# Patient Record
Sex: Male | Born: 1950 | Hispanic: No | Marital: Married | Smoking: Former smoker
Health system: Southern US, Community
[De-identification: ages and names within clinical notes are randomized; demographics above are authoritative.]

## PROBLEM LIST (undated history)

## (undated) DIAGNOSIS — M278 Other specified diseases of jaws: Secondary | ICD-10-CM

## (undated) DIAGNOSIS — M159 Polyosteoarthritis, unspecified: Secondary | ICD-10-CM

## (undated) DIAGNOSIS — I82409 Acute embolism and thrombosis of unspecified deep veins of unspecified lower extremity: Secondary | ICD-10-CM

## (undated) DIAGNOSIS — T7840XA Allergy, unspecified, initial encounter: Secondary | ICD-10-CM

## (undated) DIAGNOSIS — H409 Unspecified glaucoma: Secondary | ICD-10-CM

## (undated) DIAGNOSIS — G7241 Inclusion body myositis [IBM]: Secondary | ICD-10-CM

## (undated) DIAGNOSIS — R0600 Dyspnea, unspecified: Secondary | ICD-10-CM

## (undated) DIAGNOSIS — N401 Enlarged prostate with lower urinary tract symptoms: Secondary | ICD-10-CM

## (undated) DIAGNOSIS — M4800 Spinal stenosis, site unspecified: Secondary | ICD-10-CM

## (undated) DIAGNOSIS — J986 Disorders of diaphragm: Secondary | ICD-10-CM

## (undated) DIAGNOSIS — I251 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: Secondary | ICD-10-CM

## (undated) DIAGNOSIS — R531 Weakness: Secondary | ICD-10-CM

## (undated) DIAGNOSIS — R0989 Other specified symptoms and signs involving the circulatory and respiratory systems: Secondary | ICD-10-CM

## (undated) DIAGNOSIS — M1990 Unspecified osteoarthritis, unspecified site: Secondary | ICD-10-CM

## (undated) DIAGNOSIS — H269 Unspecified cataract: Secondary | ICD-10-CM

## (undated) DIAGNOSIS — J301 Allergic rhinitis due to pollen: Secondary | ICD-10-CM

## (undated) DIAGNOSIS — R600 Localized edema: Secondary | ICD-10-CM

## (undated) DIAGNOSIS — J189 Pneumonia, unspecified organism: Secondary | ICD-10-CM

## (undated) DIAGNOSIS — D539 Nutritional anemia, unspecified: Secondary | ICD-10-CM

## (undated) DIAGNOSIS — I4510 Unspecified right bundle-branch block: Secondary | ICD-10-CM

## (undated) DIAGNOSIS — B49 Unspecified mycosis: Secondary | ICD-10-CM

## (undated) DIAGNOSIS — I10 Essential (primary) hypertension: Secondary | ICD-10-CM

## (undated) DIAGNOSIS — D696 Thrombocytopenia, unspecified: Secondary | ICD-10-CM

## (undated) DIAGNOSIS — E785 Hyperlipidemia, unspecified: Secondary | ICD-10-CM

## (undated) DIAGNOSIS — M609 Myositis, unspecified: Secondary | ICD-10-CM

## (undated) HISTORY — DX: Other specified diseases of jaws: M27.8

## (undated) HISTORY — DX: Dyspnea, unspecified: R06.00

## (undated) HISTORY — DX: Allergic rhinitis due to pollen: J30.1

## (undated) HISTORY — DX: Polyosteoarthritis, unspecified: M15.9

## (undated) HISTORY — DX: Nutritional anemia, unspecified: D53.9

## (undated) HISTORY — DX: Allergy, unspecified, initial encounter: T78.40XA

## (undated) HISTORY — DX: Myositis, unspecified: M60.9

## (undated) HISTORY — DX: Acute embolism and thrombosis of unspecified deep veins of unspecified lower extremity: I82.409

## (undated) HISTORY — DX: Spinal stenosis, site unspecified: M48.00

## (undated) HISTORY — DX: Hyperlipidemia, unspecified: E78.5

## (undated) HISTORY — DX: Disorders of diaphragm: J98.6

## (undated) HISTORY — DX: Localized edema: R60.0

## (undated) HISTORY — PX: OTHER SURGICAL HISTORY: SHX169

## (undated) HISTORY — DX: Other specified symptoms and signs involving the circulatory and respiratory systems: R09.89

## (undated) HISTORY — DX: Unspecified mycosis: B49

## (undated) HISTORY — DX: Benign prostatic hyperplasia with lower urinary tract symptoms: N40.1

## (undated) HISTORY — DX: 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: I25.10

## (undated) HISTORY — DX: Unspecified right bundle-branch block: I45.10

## (undated) HISTORY — PX: TRANSTHORACIC ECHOCARDIOGRAM: SHX275

## (undated) HISTORY — DX: Inclusion body myositis (IBM): G72.41

## (undated) HISTORY — DX: Unspecified cataract: H26.9

## (undated) HISTORY — DX: Thrombocytopenia, unspecified: D69.6

## (undated) HISTORY — PX: COLONOSCOPY: SHX174

## (undated) HISTORY — DX: Unspecified glaucoma: H40.9

## (undated) HISTORY — PX: CORONARY ANGIOPLASTY: SHX604

## (undated) HISTORY — DX: Unspecified osteoarthritis, unspecified site: M19.90

## (undated) HISTORY — PX: EYE SURGERY: SHX253

---

## 1996-07-26 HISTORY — PX: CHOLECYSTECTOMY: SHX55

## 2003-07-27 HISTORY — PX: HYDROCELE EXCISION: SHX482

## 2008-07-26 HISTORY — PX: APPENDECTOMY: SHX54

## 2009-09-23 HISTORY — PX: CERVICAL FUSION: SHX112

## 2010-07-26 HISTORY — PX: TONSILLECTOMY AND ADENOIDECTOMY: SHX28

## 2011-07-01 LAB — HM COLONOSCOPY

## 2011-11-24 HISTORY — PX: LUMBAR SPINE SURGERY: SHX701

## 2013-09-06 DIAGNOSIS — Z23 Encounter for immunization: Secondary | ICD-10-CM | POA: Diagnosis not present

## 2013-09-06 DIAGNOSIS — I10 Essential (primary) hypertension: Secondary | ICD-10-CM | POA: Diagnosis not present

## 2013-09-06 DIAGNOSIS — E7800 Pure hypercholesterolemia, unspecified: Secondary | ICD-10-CM | POA: Diagnosis not present

## 2013-10-08 ENCOUNTER — Encounter: Payer: Self-pay | Admitting: Internal Medicine

## 2013-10-08 ENCOUNTER — Ambulatory Visit (INDEPENDENT_AMBULATORY_CARE_PROVIDER_SITE_OTHER): Payer: Medicare Other | Admitting: Internal Medicine

## 2013-10-08 VITALS — BP 159/86 | HR 57 | Ht 70.0 in | Wt 284.0 lb

## 2013-10-08 DIAGNOSIS — R9389 Abnormal findings on diagnostic imaging of other specified body structures: Secondary | ICD-10-CM

## 2013-10-08 DIAGNOSIS — R918 Other nonspecific abnormal finding of lung field: Secondary | ICD-10-CM | POA: Diagnosis not present

## 2013-10-08 DIAGNOSIS — I251 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: Secondary | ICD-10-CM

## 2013-10-08 NOTE — Patient Instructions (Signed)
Your physician has recommended you make the following change in your medication: DISCONTINUE [place] physician recommends that you schedule a follow-up appointment AT [place] June, 2015.  Your physician recommends that you return for lab work in: [place] June (FASTING)  A chest x-ray takes a picture of the organs and structures inside the chest, including the heart, lungs, and blood vessels. This test can show several things, including, whether the heart is enlarges; whether fluid is building up in the lungs; and whether pacemaker / defibrillator leads are still in place.

## 2013-10-08 NOTE — Progress Notes (Addendum)
HPI Patient is a 63 yo who has moved to [place] [name] Michaelis, MD) Patient with a history of CAD /  S/P stent to LAD in 1009    Myoview in 2011, no ischemia  Diaphragm attenuation. Ehoc:  LVEF 55%  November 2013 had stress test  Seen after by Dr Collier Bullock  Carotd USN mild atherosclerotic changes.    Does not think he has apnea  Wife does not report snoring  Zetia  Wants to stop Zetia It is expensive $$  ? If related to weakness.    Weakness.  Chronic  Has seen neurology in [place] Back/neck surgeries   Weak all over  About time started Zetia  Breathing  fair Chest  No chest pains.   BP 120-130/80-90   No Known Allergies  Current Outpatient Prescriptions  Medication Sig Dispense Refill  . aspirin 325 MG EC tablet Take 325 mg by mouth daily.      Marland Kitchen atenolol (TENORMIN) 25 MG tablet 1 tab twice a day      . b complex vitamins tablet Take 1 tablet by mouth daily.      . Coenzyme Q10 (CO Q 10 PO) Take by mouth. 1 tab daily      . folic acid (FOLVITE) 1 MG tablet Take 1 mg by mouth daily. OVER THE COUNTER      . ibuprofen (ADVIL,MOTRIN) 200 MG tablet Take 200 mg by mouth every 6 (six) hours as needed.      Marland Kitchen KRILL OIL PO Take by mouth. 1 tab daily      . lisinopril (PRINIVIL,ZESTRIL) 5 MG tablet Take 1 tab daily      . saw palmetto 160 MG capsule Take 160 mg by mouth daily.      Marland Kitchen ZETIA 10 MG tablet 1 tab daily       No current facility-administered medications for this visit.    Past Medical History  Diagnosis Date  . Coronary artery disease   . Spinal stenosis     Dr Vaughan Sine  surgery  . Myositis     chronic CK elevation ( 415 CK, ALL MM CK subtype, seen by Rheumatology and considered normal Variant)  . Snoring     sleep apnea  . Fever     Valley Fever  . Hyperlipidemia   . Palpable abd. aorta   . Palatal fistula   . Palpitations   . Dyspnea   . RBBB     Past Surgical History  Procedure Laterality Date  . Cervical fusion  09/2009  . Coronary angioplasty    . Lumbar  spine surgery  5/13    Family History  Problem Relation Age of Onset  . Heart attack Mother   . Aneurysm Father     History   Social History  . Marital Status: Unknown    Spouse Name: N/A    Number of Children: N/A  . Years of Education: N/A   Occupational History  . Not on file.   Social History Main Topics  . Smoking status: Former Research scientist (life sciences)  . Smokeless tobacco: Not on file  . Alcohol Use: Not on file  . Drug Use: Not on file  . Sexual Activity: Not on file   Other Topics Concern  . Not on file   Social History Narrative   Married, wife moving to [place]   Retired , prior occupation self   Patient is a former smoker   Alcohol use - yes  Regular exercise   Drug Use- no    Review of Systems:  All systems reviewed.  They are negative to the above problem except as previously stated.  Vital Signs: BP 159/86  Pulse 57  Ht 5\' 10"  (1.778 m)  Wt 284 lb (128.822 kg)  BMI 40.75 kg/m2  Physical Exam Patient is in [place] HEENT:  Normocephalic, atraumatic. EOMI, PERRLA.  Neck: JVP is normal.  No bruits.  Lungs: clear to auscultation. No rales no wheezes.  Heart: Regular rate and rhythm. Normal S1, S2. No S3.   No significant murmurs. PMI not displaced.  Abdomen:  Supple, nontender. Normal bowel sounds. No masses. No hepatomegaly.  Extremities:   Good distal pulses throughout. No lower extremity edema.  Musculoskeletal :moving all extremities. R arm/hand a little weaker than L Neuro:   alert and oriented x3.  CN II-XII grossly intact.  EKG SB 57  First degree AV block  208 msec.  RBBB  Assessment and Plan:  1.  CAD  I am not convinced of any active symptoms  2.  HL  With his concerns of weakness I have asked hime to stop Zetia for now  Will follow up    3.  HTN  Not optimally controlled.    4.  Weakness  Reports extensive evla in [place]  Does not have neurologist here  5.   Abnormal CXR  He reports nodule (old) and elevated R hemidiaphragm  I would get as baseline herre    I will f/u in June  Addendum  [place] test:  06/16/12.  Lexican negative.  Inferoseptal defect.  LVEF 56%  Echo:  12/03/2009:  Mild LVH  LVEF 55%  Abdominal USN  No aneurysm:  09/05/09   Carotid USN 09/05/09:  Mild atherosclerotic plaquing.    ABI 09/05/09  Normal rest stress ABI  Myositis that predated statin use.   Stent to LAD 1998  Radiculopathy L5 R   Spinal stenosis  Sleep apnea

## 2013-10-19 ENCOUNTER — Ambulatory Visit
Admission: RE | Admit: 2013-10-19 | Discharge: 2013-10-19 | Disposition: A | Discharge status: Home / Self Care | Payer: Medicare Other | Source: Ambulatory Visit | Attending: Internal Medicine | Admitting: Internal Medicine

## 2013-10-19 DIAGNOSIS — J984 Other disorders of lung: Secondary | ICD-10-CM | POA: Diagnosis not present

## 2013-10-19 DIAGNOSIS — R9389 Abnormal findings on diagnostic imaging of other specified body structures: Secondary | ICD-10-CM

## 2013-11-13 DIAGNOSIS — Z961 Presence of intraocular lens: Secondary | ICD-10-CM | POA: Diagnosis not present

## 2013-11-13 DIAGNOSIS — H1130 Conjunctival hemorrhage, unspecified eye: Secondary | ICD-10-CM | POA: Diagnosis not present

## 2013-11-13 DIAGNOSIS — H2510 Age-related nuclear cataract, unspecified eye: Secondary | ICD-10-CM | POA: Diagnosis not present

## 2013-11-13 DIAGNOSIS — H01009 Unspecified blepharitis unspecified eye, unspecified eyelid: Secondary | ICD-10-CM | POA: Diagnosis not present

## 2013-12-11 DIAGNOSIS — Z961 Presence of intraocular lens: Secondary | ICD-10-CM | POA: Diagnosis not present

## 2013-12-11 DIAGNOSIS — H26499 Other secondary cataract, unspecified eye: Secondary | ICD-10-CM | POA: Diagnosis not present

## 2013-12-11 DIAGNOSIS — H4011X0 Primary open-angle glaucoma, stage unspecified: Secondary | ICD-10-CM | POA: Diagnosis not present

## 2013-12-11 DIAGNOSIS — H409 Unspecified glaucoma: Secondary | ICD-10-CM | POA: Diagnosis not present

## 2014-01-02 ENCOUNTER — Other Ambulatory Visit (INDEPENDENT_AMBULATORY_CARE_PROVIDER_SITE_OTHER): Payer: Medicare Other

## 2014-01-02 ENCOUNTER — Other Ambulatory Visit: Payer: Self-pay | Admitting: *Deleted

## 2014-01-02 DIAGNOSIS — E785 Hyperlipidemia, unspecified: Secondary | ICD-10-CM | POA: Diagnosis not present

## 2014-01-02 LAB — LIPID PANEL
CHOLESTEROL: 186 mg/dL (ref 0–200)
HDL: 41.9 mg/dL (ref >39.00)
LDL Cholesterol: 129 mg/dL — ABNORMAL HIGH (ref 0–99)
NONHDL: 144.1
Total CHOL/HDL Ratio: 4
Triglycerides: 76 mg/dL (ref 0.0–149.0)
VLDL: 15.2 mg/dL (ref 0.0–40.0)

## 2014-01-04 ENCOUNTER — Ambulatory Visit (INDEPENDENT_AMBULATORY_CARE_PROVIDER_SITE_OTHER): Payer: Medicare Other | Admitting: Internal Medicine

## 2014-01-04 ENCOUNTER — Encounter: Payer: Self-pay | Admitting: Internal Medicine

## 2014-01-04 ENCOUNTER — Other Ambulatory Visit: Payer: PRIVATE HEALTH INSURANCE

## 2014-01-04 VITALS — BP 130/83 | HR 54 | Ht 70.0 in | Wt 291.0 lb

## 2014-01-04 DIAGNOSIS — I251 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: Secondary | ICD-10-CM | POA: Diagnosis not present

## 2014-01-04 DIAGNOSIS — E785 Hyperlipidemia, unspecified: Secondary | ICD-10-CM | POA: Diagnosis not present

## 2014-01-04 DIAGNOSIS — G729 Myopathy, unspecified: Secondary | ICD-10-CM

## 2014-01-04 LAB — CBC
HEMATOCRIT: 40.9 % (ref 39.0–52.0)
Hemoglobin: 14 g/dL (ref 13.0–17.0)
MCHC: 34.2 g/dL (ref 30.0–36.0)
MCV: 97.9 fl (ref 78.0–100.0)
Platelets: 127 10*3/uL — ABNORMAL LOW (ref 150.0–400.0)
RBC: 4.18 Mil/uL — AB (ref 4.22–5.81)
RDW: 13.7 % (ref 11.5–15.5)
WBC: 5.3 10*3/uL (ref 4.0–10.5)

## 2014-01-04 LAB — BASIC METABOLIC PANEL
BUN: 22 mg/dL (ref 6–23)
CHLORIDE: 105 meq/L (ref 96–112)
CO2: 28 mEq/L (ref 19–32)
CREATININE: 1 mg/dL (ref 0.4–1.5)
Calcium: 9.4 mg/dL (ref 8.4–10.5)
GFR: 82.45 mL/min (ref >60.00)
Glucose, Bld: 90 mg/dL (ref 70–99)
Potassium: 4.4 mEq/L (ref 3.5–5.1)
Sodium: 140 mEq/L (ref 135–145)

## 2014-01-04 LAB — CK: CK TOTAL: 301 U/L — AB (ref 7–232)

## 2014-01-04 MED ORDER — ATENOLOL 25 MG PO TABS: 25.0000 mg | ORAL_TABLET | Freq: Two times a day (BID) | ORAL | Status: DC

## 2014-01-04 MED ORDER — LISINOPRIL 10 MG PO TABS: 10.0000 mg | ORAL_TABLET | Freq: Every day | ORAL | Status: DC

## 2014-01-04 NOTE — Progress Notes (Signed)
HPI Patient is a 63 yo who has moved to [place] Jake Michaelis, MD) Patient with a history of CAD /  S/P stent to LAD in 1009    Myoview in 2011, no ischemia  Diaphragm attenuation. Ehoc:  LVEF 55%  November 2013 had stress test  Seen after by Dr Collier Bullock  Carotd USN mild atherosclerotic changes.      Weakness.  Chronic  Has seen neurology in [place] Back/neck surgeries   Weak all over  About time started Zetia  I saw the patient in March 2015 Had chest cold that lingered then went awayy He stopped Zetia and has noticed on change in his strength    NO CP   Activity limted by weakness. 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   Needs Rx for lisinopril No change with Zetia 10 in weakness  Wants to finish what has   No Known Allergies  Current Outpatient Prescriptions  Medication Sig Dispense Refill  . aspirin 325 MG EC tablet Take 325 mg by mouth daily.      Marland Kitchen atenolol (TENORMIN) 25 MG tablet 1 tab twice a day      . b complex vitamins tablet Take 1 tablet by mouth daily.      . Coenzyme Q10 (CO Q 10 PO) Take by mouth. 1 tab daily      . DiphenhydrAMINE HCl (BENADRYL ALLERGY PO) Take by mouth at bedtime as needed.      . folic acid (FOLVITE) 1 MG tablet Take 1 mg by mouth daily. OVER THE COUNTER      . ibuprofen (ADVIL,MOTRIN) 200 MG tablet Take 200 mg by mouth every 6 (six) hours as needed.      Marland Kitchen KRILL OIL PO Take by mouth. 1 tab daily      . lisinopril (PRINIVIL,ZESTRIL) 10 MG tablet Take 10 mg by mouth daily.      . saw palmetto 160 MG capsule Take 160 mg by mouth daily.       No current facility-administered medications for this visit.    Past Medical History  Diagnosis Date  . Coronary artery disease   . Spinal stenosis     Dr Vaughan Sine  surgery  . Myositis     chronic CK elevation ( 415 CK, ALL MM CK subtype, seen by Rheumatology and considered normal Variant)  . Snoring     sleep apnea  . Fever     [place]ley Fever  . Hyperlipidemia   . Palpable abd. aorta   . Palatal fistula   .  Palpitations   . Dyspnea   . RBBB     Past Surgical History  Procedure Laterality Date  . Cervical fusion  09/2009  . Coronary angioplasty    . Lumbar spine surgery  5/13    Family History  Problem Relation Age of Onset  . Heart attack Mother   . Aneurysm Father     History   Social History  . Marital Status: Unknown    Spouse Name: N/A    Number of Children: N/A  . Years of Education: N/A   Occupational History  . Not on file.   Social History Main Topics  . Smoking status: Former Research scientist (life sciences)  . Smokeless tobacco: Not on file  . Alcohol Use: Not on file  . Drug Use: Not on file  . Sexual Activity: Not on file   Other Topics Concern  . Not on file   Social History Narrative   Married, wife  moving to [place]   Retired , prior occupation self   Patient is a former smoker   Alcohol use - yes   Regular exercise   Drug Use- no    Review of Systems:  All systems reviewed.  They are negative to the above problem except as previously stated.  Vital Signs: BP 130/83  Pulse 54  Ht 5\' 10"  (1.778 m)  Wt 291 lb (131.997 kg)  BMI 41.75 kg/m2  Physical Exam Patient is a morbidly obese 63 yo in  NAd HEENT:  Normocephalic, atraumatic. EOMI, PERRLA.  Neck: JVP is normal.  No bruits.  Lungs: clear to auscultation. No rales no wheezes.  Heart: Regular rate and rhythm. Normal S1, S2. No S3.   No significant murmurs. PMI not displaced.  Abdomen:  Supple, nontender. Normal bowel sounds. No masses. No hepatomegaly.  Extremities:   Good distal pulses throughout. No lower extremity edema.  Musculoskeletal :moving all extremities. R arm/hand a little weaker than L Neuro:   alert and oriented x3.  CN II-XII grossly intact.   Assessment and Plan:  1.  CAD  No  active symptoms  2.  HL  Patient stopped zetia  No change in strength Has tried statin in past  Not sure if it efected strength but stopped.   May try Crestor.  LDL a wk ago 129  Discuss with J Smart  3.  HTN  Adequate  control  COntinue  Just ran out of lisinoprl  WIll refill.     4.  Weakness  Reports extensive eval in [place]  Will check CK     5.   Abnormal CXR   He has some streakness RML  REview of reprots similar area commented on.    Addendum  Cardiologist Juanna Cao  Stress test:  06/16/12.  Lexican negative.  Inferoseptal defect.  LVEF 56%  Echo:  12/03/2009:  Mild LVH  LVEF 55%  Abdominal USN  No aneurysm:  09/05/09   Carotid USN 09/05/09:  Mild atherosclerotic plaquing.    CXR 2011 with infiltrate RML    ABI 09/05/09  Normal rest stress ABI  Myositis that predated statin use.   Stent to LAD 1998  Radiculopathy L5 R   Spinal stenosis  Sleep apnea

## 2014-01-04 NOTE — Patient Instructions (Signed)
Your physician recommends that you have lab work today: BMP, CBC and CK  Your physician wants you to follow-up in: December with Dr Harrington Challenger. You will receive a reminder letter in the mail two months in advance. If you don't receive a letter, please call our office to schedule the follow-up appointment.  Your physician recommends that you continue on your current medications as directed. Please refer to the Current Medication list given to you today.

## 2014-02-04 DIAGNOSIS — H26499 Other secondary cataract, unspecified eye: Secondary | ICD-10-CM | POA: Diagnosis not present

## 2014-02-04 DIAGNOSIS — Z961 Presence of intraocular lens: Secondary | ICD-10-CM | POA: Diagnosis not present

## 2014-03-06 DIAGNOSIS — D1801 Hemangioma of skin and subcutaneous tissue: Secondary | ICD-10-CM | POA: Diagnosis not present

## 2014-03-06 DIAGNOSIS — L738 Other specified follicular disorders: Secondary | ICD-10-CM | POA: Diagnosis not present

## 2014-03-06 DIAGNOSIS — L570 Actinic keratosis: Secondary | ICD-10-CM | POA: Diagnosis not present

## 2014-03-06 DIAGNOSIS — L821 Other seborrheic keratosis: Secondary | ICD-10-CM | POA: Diagnosis not present

## 2014-03-06 DIAGNOSIS — I8310 Varicose veins of unspecified lower extremity with inflammation: Secondary | ICD-10-CM | POA: Diagnosis not present

## 2014-03-08 DIAGNOSIS — Z961 Presence of intraocular lens: Secondary | ICD-10-CM | POA: Diagnosis not present

## 2014-03-08 DIAGNOSIS — H52229 Regular astigmatism, unspecified eye: Secondary | ICD-10-CM | POA: Diagnosis not present

## 2014-03-08 DIAGNOSIS — H4011X0 Primary open-angle glaucoma, stage unspecified: Secondary | ICD-10-CM | POA: Diagnosis not present

## 2014-03-08 DIAGNOSIS — H409 Unspecified glaucoma: Secondary | ICD-10-CM | POA: Diagnosis not present

## 2014-03-28 ENCOUNTER — Telehealth: Payer: Self-pay | Admitting: Internal Medicine

## 2014-03-28 NOTE — Telephone Encounter (Signed)
Following up:         Pt would like to start ZETIA  and needs a script for something cheap.  Pt also mentioned a study he would like to participate in.   Please give pt a call.

## 2014-04-03 NOTE — Telephone Encounter (Signed)
Patient called again to follow up on whether he can change to something cheaper than zetia or be put in the study that Dr Harrington Challenger discussed with him. Please advise. Thanks, MI

## 2014-04-03 NOTE — Telephone Encounter (Signed)
Spoke with patient. Had been on a 4-6 month break from cholesterol medication. Restarted his Zetia and is now almost out. Would prefer a less expensive cholesterol medicine and if possible will participate in study as discussed with Dr. Harrington Challenger at last visit.

## 2014-04-15 ENCOUNTER — Encounter: Payer: Self-pay | Admitting: Internal Medicine

## 2014-04-18 MED ORDER — ATORVASTATIN CALCIUM 10 MG PO TABS: 5.0000 mg | ORAL_TABLET | Freq: Every day | ORAL | Status: DC

## 2014-04-18 NOTE — Telephone Encounter (Signed)
Previous note I recomm 5 mg qod Would recomm every dayinstead, to start when he gets back from trip.   ----- Message ----- From: Maralyn Sago Smart, [place]: 04/03/2014 2:52 PM To: [name], MD Couple of things. PCSK-9 inhibitor could be considered if he is on highest tolerated statin dose and still has elevated LDL. Cost could be issue unless his household income low. Cost is an issue with patient so he declined Crestor 2.5 mg qd recommendation in past. H/o CK 301 off statin. Appears having to stop Zetia due to cost. LDL is 129 mg/dL on Zetia. Could recommend generic atorvastatin 10 mg qd and recheck lipid / liver in 3 months. Would be helpful to know which statins were tried and failed while in [place] to help with getting PCSK-9 inhibitor approved in case we needed to go this route. For now I would recommend generic atorvastatin 10 mg qd, and if he develops much aches, to reduce it to 1/2 tablet daily. Recheck lipid/liver in 3 months.   INFORMED PATIENT TO BEGIN ATORVASTATIN 5 MG (1/2 TAB) DAILY WHEN HE GETS BACK FROM HIS TRIP IN ABOUT 2 WEEKS. VERBALIZES UNDERSTANDING  AND AGREEMENT.

## 2014-05-27 ENCOUNTER — Telehealth: Payer: Self-pay | Admitting: Internal Medicine

## 2014-05-27 NOTE — Telephone Encounter (Deleted)
Error

## 2014-06-14 ENCOUNTER — Encounter: Payer: Self-pay | Admitting: Internal Medicine

## 2014-06-14 ENCOUNTER — Ambulatory Visit (INDEPENDENT_AMBULATORY_CARE_PROVIDER_SITE_OTHER): Payer: Medicare Other | Admitting: Internal Medicine

## 2014-06-14 VITALS — BP 140/84 | HR 70 | Ht 70.0 in | Wt 292.4 lb

## 2014-06-14 DIAGNOSIS — I251 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: Secondary | ICD-10-CM | POA: Diagnosis not present

## 2014-06-14 DIAGNOSIS — R079 Chest pain, unspecified: Secondary | ICD-10-CM | POA: Diagnosis not present

## 2014-06-14 MED ORDER — NITROGLYCERIN 0.4 MG SL SUBL: 0.4000 mg | SUBLINGUAL_TABLET | SUBLINGUAL | Status: DC | PRN

## 2014-06-14 NOTE — Progress Notes (Addendum)
HPI Patient is a 63 yo who has moved to [place] [name] Michaelis, MD) Patient with a history of CAD /  S/P stent to LAD in 1009    Myoview in 2011, no ischemia  Diaphragm attenuation. Ehoc:  LVEF 55%  November 2013 had stress test  Seen after by Dr Collier Bullock  Carotd USN mild atherosclerotic changes.      Weakness.  Chronic  Has seen neurology in [place] Back/neck surgeries   Weak all over  About time started Zetia He stopped Zetia and has noticed on change in his strength   I saw him last in June  Since seen he has had continued problems with his back He has noticed symptoms of neck pain that are worse with lying and get better when he stands up Over the last 2 wks this has gotten worse.  Now he has some spread to his anterior chest and arm Difficut to follow  Discomfort may improve with standing  One spell lasted a while  No SOB  No dizziness  When he first had intervention done he had back pain.  He was alos SOB with activty He is not that active now    No Known Allergies  Current Outpatient Prescriptions  Medication Sig Dispense Refill  . aspirin 325 MG EC tablet Take 325 mg by mouth daily.    Marland Kitchen atenolol (TENORMIN) 25 MG tablet Take 1 tablet (25 mg total) by mouth 2 (two) times daily. 180 tablet 3  . atorvastatin (LIPITOR) 10 MG tablet Take 0.5 tablets (5 mg total) by mouth daily. 30 tablet 6  . b complex vitamins tablet Take 1 tablet by mouth daily.    . Coenzyme Q10 (CO Q 10 PO) Take by mouth. 1 tab daily    . DiphenhydrAMINE HCl (BENADRYL ALLERGY PO) Take by mouth at bedtime as needed.    . folic acid (FOLVITE) 1 MG tablet Take 1 mg by mouth daily. OVER THE COUNTER    . GLUCOSAMINE CHONDROITIN COMPLX PO Take 1 tablet by mouth 2 (two) times daily.    Marland Kitchen ibuprofen (ADVIL,MOTRIN) 200 MG tablet Take 200 mg by mouth every 6 (six) hours as needed.    Marland Kitchen KRILL OIL PO Take by mouth. 1 tab daily    . lisinopril (PRINIVIL,ZESTRIL) 10 MG tablet Take 1 tablet (10 mg total) by mouth daily. 90 tablet 3   . saw palmetto 160 MG capsule Take 160 mg by mouth daily.     No current facility-administered medications for this visit.    Past Medical History  Diagnosis Date  . Coronary artery disease   . Spinal stenosis     Dr Vaughan Sine  surgery  . Myositis     chronic CK elevation ( 415 CK, ALL MM CK subtype, seen by Rheumatology and considered normal Variant)  . Snoring     sleep apnea  . Fever     Valley Fever  . Hyperlipidemia   . Palpable abd. aorta   . Palatal fistula   . Palpitations   . Dyspnea   . RBBB     Past Surgical History  Procedure Laterality Date  . Cervical fusion  09/2009  . Coronary angioplasty    . Lumbar spine surgery  5/13    Family History  Problem Relation Age of Onset  . Heart attack Mother   . Aneurysm Father     History   Social History  . Marital Status: Unknown    Spouse  Name: N/A    Number of Children: N/A  . Years of Education: N/A   Occupational History  . Not on file.   Social History Main Topics  . Smoking status: Former Research scientist (life sciences)  . Smokeless tobacco: Not on file  . Alcohol Use: Not on file  . Drug Use: Not on file  . Sexual Activity: Not on file   Other Topics Concern  . Not on file   Social History Narrative   Married, wife moving to [place]   Retired , prior occupation self   Patient is a former smoker   Alcohol use - yes   Regular exercise   Drug Use- no    Review of Systems:  All systems reviewed.  They are negative to the above problem except as previously stated.  Vital Signs: BP 140/84 mmHg  Pulse 70  Ht 5\' 10"  (1.778 m)  Wt 292 lb 6.4 oz (132.632 kg)  BMI 41.96 kg/m2  Physical Exam Patient is a morbidly obese 63 yo in  NAd HEENT:  Normocephalic, atraumatic. EOMI, PERRLA.  Neck: JVP is normal.  No bruits.  Lungs: clear to auscultation. No rales no wheezes.  Heart: Regular rate and rhythm. Normal S1, S2. No S3.   No significant murmurs. PMI not displaced.  Abdomen:  Supple, nontender. Normal bowel sounds. No  masses. No hepatomegaly.  Extremities:   Good distal pulses throughout. No lower extremity edema.  Musculoskeletal :moving all extremities. R arm/hand a little weaker than L Neuro:   alert and oriented x3.  CN II-XII grossly intact.   Assessment and Plan:  1.  CAD  I am not convinced current symptoms are cardiac in origin  Has signif back problmes which may explain   I would set up with lexiscan myoview  Keep on same regimen  Rx for NTG givin  2.  HL  Keep on lipitor  Will need to follow   3.  HTN  Fair control  Continue meds     4.  Weakness  Reports extensive eval in [place]  Stress test:  06/16/12.  Lexican negative.  Inferoseptal defect.  LVEF 56%  Echo:  12/03/2009:  Mild LVH  LVEF 55%  Abdominal USN  No aneurysm:  09/05/09   Carotid USN 09/05/09:  Mild atherosclerotic plaquing.    CXR 2011 with infiltrate RML    ABI 09/05/09  Normal rest stress ABI  Myositis that predated statin use.   Stent to LAD 1998  Radiculopathy L5 R   Spinal stenosis  Sleep apnea   ADDENDUM:  06/21/14  Patient underwent myoview stress test earlier this week.  This showed a large area of inferior/inferolateral ischemia consistent with scar with signif periinfarct ischemia  This is different from previous report.   Given this and sympotms being similar some in quality with previous would recomm L heart cath to define Patient familiar with procedure  Understands risks and agrees to proceed.  Sched for Mon 12/1    Dorris Carnes

## 2014-06-14 NOTE — Patient Instructions (Signed)
Your physician has recommended you make the following change in your medication:  1.) nitroglycerin SL 0.4mg  ---one tablet under your tongue every 5 minutes x3 as needed for chest pain Your physician has requested that you have a lexiscan myoview. For further information please visit [email or web address]. Please follow instruction sheet, as given.

## 2014-06-17 ENCOUNTER — Ambulatory Visit (HOSPITAL_COMMUNITY): Payer: Medicare Other | Attending: Cardiology | Admitting: Radiology

## 2014-06-17 DIAGNOSIS — R079 Chest pain, unspecified: Secondary | ICD-10-CM | POA: Diagnosis not present

## 2014-06-17 DIAGNOSIS — R0609 Other forms of dyspnea: Secondary | ICD-10-CM | POA: Diagnosis not present

## 2014-06-17 DIAGNOSIS — R002 Palpitations: Secondary | ICD-10-CM | POA: Diagnosis not present

## 2014-06-17 DIAGNOSIS — E785 Hyperlipidemia, unspecified: Secondary | ICD-10-CM | POA: Diagnosis not present

## 2014-06-17 DIAGNOSIS — I10 Essential (primary) hypertension: Secondary | ICD-10-CM | POA: Diagnosis not present

## 2014-06-17 MED ORDER — TECHNETIUM TC 99M SESTAMIBI GENERIC - CARDIOLITE
33.0000 | Freq: Once | INTRAVENOUS | Status: AC | PRN
  Administered 2014-06-17: 33 via INTRAVENOUS

## 2014-06-17 MED ORDER — REGADENOSON 0.4 MG/5ML IV SOLN
0.4000 mg | Freq: Once | INTRAVENOUS | Status: AC
  Administered 2014-06-17: 0.4 mg via INTRAVENOUS

## 2014-06-17 NOTE — Progress Notes (Signed)
[facility] [address] [place], [place] [address] [phone]    Cardiology Nuclear Med Study  [name] is a 63 y.o. male     MRN : [id number]     DOB: 09-22-1950  Procedure Date: 06/17/2014  Nuclear Med Background Indication for Stress Test:  Evaluation for Ischemia and Follow up CAD History:  2013 MPI: EF:% 56% [place] CAD: Stent LAD '98 Cardiac Risk Factors: Hypertension and Lipids  Symptoms:  Chest Pain, DOE, Palpitations and SOB   Nuclear Pre-Procedure Caffeine/Decaff Intake:  None NPO After: 7:00pm   Lungs:  clear O2 Sat: 98% on room air. IV 0.9% NS with Angio Cath:  22g  IV Site: R Hand  IV Started by:  Matilde Haymaker, RN  Chest Size (in):  56 Cup Size: n/a  Height: 5\' 10"  (1.778 m)  Weight:  288 lb (130.636 kg)  BMI:  Body mass index is 41.32 kg/(m^2). Tech Comments:  Coreg taken today at [address] 1 or 2 day study: 2 day  Stress Test Type:  Carlton Adam  Reading MD: n/a  Order Authorizing Provider:  Nevin Bloodgood Ross,MD  Resting Radionuclide: Technetium 56m Sestamibi  Resting Radionuclide Dose: 33.0 mCi  On      06-18-14  Stress Radionuclide:  Technetium 22m Sestamibi  Stress Radionuclide Dose: 33.0 mCi  On        06-18-14          Stress Protocol Rest HR: 59 Stress HR: 80  Rest BP: 129/75 Stress BP: 141/76  Exercise Time (min): n/a METS: n/a   Predicted Max HR: 155 bpm % Max HR: 51.61 bpm Rate Pressure Product: 11280   Dose of Adenosine (mg):  n/a Dose of Lexiscan: 0.4 mg  Dose of Atropine (mg): n/a Dose of Dobutamine: n/a mcg/kg/min (at max HR)  Stress Test Technologist: Perrin Maltese, EMT-P  Nuclear Technologist:  Earl Many, CNMT     Rest Procedure:  Myocardial perfusion imaging was performed at rest 45 minutes following the intravenous administration of Technetium 11m Sestamibi. Rest ECG: NSR-RBBB  Stress Procedure:  The patient received IV Lexiscan 0.4 mg over 15-seconds.  Technetium 14m  Sestamibi injected at 30-seconds. This patient was sob with the Lexiscan injection. Quantitative spect images were obtained after a 45 minute delay. Stress ECG: No significant change from baseline ECG  QPS Raw Data Images:  Mild diaphragmatic attenuation.  Normal left ventricular size. Stress Images:  There is a partially fixed mostly reversible defect large in size involving the inferolateral, inferior wall distribution consistent with infarct and notable peri-infarct ischemia. Otherwise there is homogeneous radiortracer uptake at both rest and stress.  Rest Images:  As above.  Subtraction (SDS):  10, +ischemia Transient Ischemic Dilatation (Normal <1.22):  1.05 Lung/Heart Ratio (Normal <0.45):  0.41  Quantitative Gated Spect Images QGS EDV:  149 ml QGS ESV:  73 ml  Impression Exercise Capacity:  Lexiscan with no exercise. BP Response:  Normal blood pressure response. Clinical Symptoms:  Typical symptoms with Lexiscan ECG Impression:  No significant ST segment change suggestive of ischemia. Comparison with Prior Nuclear Study: No images to compare  Overall Impression:  Intermediate to high risk stress nuclear study with partially fixed mostly reversible defect large in size involving the inferolateral, inferior wall distribution consistent with infarct and notable peri-infarct ischemia.  LV Ejection Fraction: 51%.  LV Wall Motion:  There is distal lateral hypokinesis.   Candee Furbish, MD

## 2014-06-18 ENCOUNTER — Ambulatory Visit (HOSPITAL_COMMUNITY): Payer: Medicare Other | Attending: Internal Medicine

## 2014-06-18 DIAGNOSIS — R0989 Other specified symptoms and signs involving the circulatory and respiratory systems: Secondary | ICD-10-CM

## 2014-06-18 MED ORDER — TECHNETIUM TC 99M SESTAMIBI GENERIC - CARDIOLITE
33.0000 | Freq: Once | INTRAVENOUS | Status: AC | PRN
  Administered 2014-06-18: 33 via INTRAVENOUS

## 2014-06-19 ENCOUNTER — Encounter: Payer: Self-pay | Admitting: *Deleted

## 2014-06-19 ENCOUNTER — Telehealth: Payer: Self-pay | Admitting: *Deleted

## 2014-06-19 DIAGNOSIS — R9439 Abnormal result of other cardiovascular function study: Secondary | ICD-10-CM

## 2014-06-19 DIAGNOSIS — R079 Chest pain, unspecified: Secondary | ICD-10-CM

## 2014-06-19 NOTE — Telephone Encounter (Signed)
Dr. Harrington Challenger notified pt of abn myoview.  She scheduled him for cath on 11/30 at 7:30 with Dr. Shelva Majestic.  Spoke w/pt and he will come in Fri 11/27 for precath labs.  Gave instructions regarding medications, time and place.  He verbalizes understanding.

## 2014-06-21 ENCOUNTER — Other Ambulatory Visit (INDEPENDENT_AMBULATORY_CARE_PROVIDER_SITE_OTHER): Payer: Medicare Other | Admitting: *Deleted

## 2014-06-21 DIAGNOSIS — R9439 Abnormal result of other cardiovascular function study: Secondary | ICD-10-CM | POA: Diagnosis not present

## 2014-06-21 DIAGNOSIS — R079 Chest pain, unspecified: Secondary | ICD-10-CM

## 2014-06-21 LAB — BASIC METABOLIC PANEL
BUN: 19 mg/dL (ref 6–23)
CO2: 28 mEq/L (ref 19–32)
Calcium: 8.9 mg/dL (ref 8.4–10.5)
Chloride: 108 mEq/L (ref 96–112)
Creatinine, Ser: 0.9 mg/dL (ref 0.4–1.5)
GFR: 89.77 mL/min (ref >60.00)
Glucose, Bld: 91 mg/dL (ref 70–99)
Potassium: 4.2 mEq/L (ref 3.5–5.1)
SODIUM: 141 meq/L (ref 135–145)

## 2014-06-21 LAB — PROTIME-INR
INR: 0.9 ratio (ref 0.8–1.0)
PROTHROMBIN TIME: 10.5 s (ref 9.6–13.1)

## 2014-06-21 LAB — CBC WITH DIFFERENTIAL/PLATELET
Basophils Absolute: 0 10*3/uL (ref 0.0–0.1)
Basophils Relative: 0.3 % (ref 0.0–3.0)
EOS PCT: 5 % (ref 0.0–5.0)
Eosinophils Absolute: 0.2 10*3/uL (ref 0.0–0.7)
HEMATOCRIT: 41.7 % (ref 39.0–52.0)
Hemoglobin: 13.9 g/dL (ref 13.0–17.0)
LYMPHS ABS: 1.3 10*3/uL (ref 0.7–4.0)
LYMPHS PCT: 27.5 % (ref 12.0–46.0)
MCHC: 33.3 g/dL (ref 30.0–36.0)
MCV: 98.3 fl (ref 78.0–100.0)
MONOS PCT: 9.7 % (ref 3.0–12.0)
Monocytes Absolute: 0.5 10*3/uL (ref 0.1–1.0)
Neutro Abs: 2.7 10*3/uL (ref 1.4–7.7)
Neutrophils Relative %: 57.5 % (ref 43.0–77.0)
PLATELETS: 119 10*3/uL — AB (ref 150.0–400.0)
RBC: 4.24 Mil/uL (ref 4.22–5.81)
RDW: 13.4 % (ref 11.5–15.5)
WBC: 4.7 10*3/uL (ref 4.0–10.5)

## 2014-06-21 LAB — APTT: aPTT: 30.7 s (ref 23.4–32.7)

## 2014-06-24 ENCOUNTER — Ambulatory Visit (HOSPITAL_COMMUNITY)
Admission: RE | Admit: 2014-06-24 | Discharge: 2014-06-25 | Disposition: A | Discharge status: Home / Self Care | Payer: Medicare Other | Source: Ambulatory Visit | Attending: Cardiovascular Disease | Admitting: Cardiovascular Disease

## 2014-06-24 ENCOUNTER — Encounter (HOSPITAL_COMMUNITY)
Admission: RE | Disposition: A | Discharge status: Home / Self Care | Payer: Medicare Other | Source: Ambulatory Visit | Attending: Cardiovascular Disease

## 2014-06-24 ENCOUNTER — Encounter (HOSPITAL_COMMUNITY): Payer: Self-pay | Admitting: General Practice

## 2014-06-24 DIAGNOSIS — Z7982 Long term (current) use of aspirin: Secondary | ICD-10-CM | POA: Diagnosis not present

## 2014-06-24 DIAGNOSIS — Z87891 Personal history of nicotine dependence: Secondary | ICD-10-CM | POA: Insufficient documentation

## 2014-06-24 DIAGNOSIS — Z79899 Other long term (current) drug therapy: Secondary | ICD-10-CM | POA: Diagnosis not present

## 2014-06-24 DIAGNOSIS — Z23 Encounter for immunization: Secondary | ICD-10-CM | POA: Diagnosis not present

## 2014-06-24 DIAGNOSIS — E785 Hyperlipidemia, unspecified: Secondary | ICD-10-CM | POA: Diagnosis present

## 2014-06-24 DIAGNOSIS — I25110 Atherosclerotic heart disease of native coronary artery with unstable angina pectoris: Secondary | ICD-10-CM | POA: Diagnosis not present

## 2014-06-24 DIAGNOSIS — R079 Chest pain, unspecified: Secondary | ICD-10-CM | POA: Diagnosis present

## 2014-06-24 DIAGNOSIS — M5416 Radiculopathy, lumbar region: Secondary | ICD-10-CM | POA: Insufficient documentation

## 2014-06-24 DIAGNOSIS — M4800 Spinal stenosis, site unspecified: Secondary | ICD-10-CM | POA: Diagnosis present

## 2014-06-24 DIAGNOSIS — M609 Myositis, unspecified: Secondary | ICD-10-CM | POA: Diagnosis not present

## 2014-06-24 DIAGNOSIS — I10 Essential (primary) hypertension: Secondary | ICD-10-CM | POA: Insufficient documentation

## 2014-06-24 DIAGNOSIS — G4730 Sleep apnea, unspecified: Secondary | ICD-10-CM | POA: Insufficient documentation

## 2014-06-24 DIAGNOSIS — I200 Unstable angina: Secondary | ICD-10-CM | POA: Diagnosis present

## 2014-06-24 DIAGNOSIS — Z791 Long term (current) use of non-steroidal anti-inflammatories (NSAID): Secondary | ICD-10-CM | POA: Diagnosis not present

## 2014-06-24 DIAGNOSIS — I251 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: Secondary | ICD-10-CM

## 2014-06-24 DIAGNOSIS — Z955 Presence of coronary angioplasty implant and graft: Secondary | ICD-10-CM | POA: Insufficient documentation

## 2014-06-24 DIAGNOSIS — R531 Weakness: Secondary | ICD-10-CM

## 2014-06-24 DIAGNOSIS — G7241 Inclusion body myositis [IBM]: Secondary | ICD-10-CM | POA: Diagnosis present

## 2014-06-24 HISTORY — DX: Essential (primary) hypertension: I10

## 2014-06-24 HISTORY — DX: Pneumonia, unspecified organism: J18.9

## 2014-06-24 HISTORY — PX: PERCUTANEOUS CORONARY STENT INTERVENTION (PCI-S): SHX5485

## 2014-06-24 HISTORY — PX: LEFT HEART CATHETERIZATION WITH CORONARY ANGIOGRAM: SHX5451

## 2014-06-24 HISTORY — PX: CORONARY STENT PLACEMENT: SHX1402

## 2014-06-24 HISTORY — DX: Weakness: R53.1

## 2014-06-24 LAB — POCT ACTIVATED CLOTTING TIME

## 2014-06-24 SURGERY — LEFT HEART CATHETERIZATION WITH CORONARY ANGIOGRAM
Anesthesia: LOCAL

## 2014-06-24 MED ORDER — SODIUM CHLORIDE 0.9 % IJ SOLN: 3.0000 mL | INTRAMUSCULAR | Status: DC | PRN

## 2014-06-24 MED ORDER — ATORVASTATIN CALCIUM 10 MG PO TABS
5.0000 mg | ORAL_TABLET | Freq: Every day | ORAL | Status: DC
  Administered 2014-06-24: 5 mg via ORAL
  Filled 2014-06-24 (×2): qty 0.5

## 2014-06-24 MED ORDER — ACETAMINOPHEN 325 MG PO TABS: 650.0000 mg | ORAL_TABLET | ORAL | Status: DC | PRN

## 2014-06-24 MED ORDER — SODIUM CHLORIDE 0.9 % IJ SOLN: 3.0000 mL | Freq: Two times a day (BID) | INTRAMUSCULAR | Status: DC

## 2014-06-24 MED ORDER — ONDANSETRON HCL 4 MG/2ML IJ SOLN: 4.0000 mg | Freq: Four times a day (QID) | INTRAMUSCULAR | Status: DC | PRN

## 2014-06-24 MED ORDER — TICAGRELOR 90 MG PO TABS
ORAL_TABLET | ORAL | Status: AC
  Filled 2014-06-24: qty 1

## 2014-06-24 MED ORDER — OXYCODONE-ACETAMINOPHEN 5-325 MG PO TABS
1.0000 | ORAL_TABLET | ORAL | Status: DC | PRN
  Administered 2014-06-24: 1 via ORAL
  Filled 2014-06-24: qty 1

## 2014-06-24 MED ORDER — TICAGRELOR 90 MG PO TABS
90.0000 mg | ORAL_TABLET | Freq: Two times a day (BID) | ORAL | Status: DC
  Administered 2014-06-24 – 2014-06-25 (×2): 90 mg via ORAL
  Filled 2014-06-24 (×3): qty 1

## 2014-06-24 MED ORDER — VERAPAMIL HCL 2.5 MG/ML IV SOLN
INTRAVENOUS | Status: AC
  Filled 2014-06-24: qty 2

## 2014-06-24 MED ORDER — ATENOLOL 25 MG PO TABS
25.0000 mg | ORAL_TABLET | Freq: Two times a day (BID) | ORAL | Status: DC
  Administered 2014-06-24 – 2014-06-25 (×2): 25 mg via ORAL
  Filled 2014-06-24 (×3): qty 1

## 2014-06-24 MED ORDER — LIDOCAINE HCL (PF) 1 % IJ SOLN
INTRAMUSCULAR | Status: AC
  Filled 2014-06-24: qty 30

## 2014-06-24 MED ORDER — SODIUM CHLORIDE 0.9 % IV SOLN: 250.0000 mL | INTRAVENOUS | Status: DC | PRN

## 2014-06-24 MED ORDER — HEPARIN SODIUM (PORCINE) 1000 UNIT/ML IJ SOLN
INTRAMUSCULAR | Status: AC
  Filled 2014-06-24: qty 1

## 2014-06-24 MED ORDER — HEPARIN (PORCINE) IN NACL 2-0.9 UNIT/ML-% IJ SOLN
INTRAMUSCULAR | Status: AC
  Filled 2014-06-24: qty 1000

## 2014-06-24 MED ORDER — SODIUM CHLORIDE 0.9 % IV SOLN
INTRAVENOUS | Status: DC
  Administered 2014-06-24: 07:00:00 via INTRAVENOUS

## 2014-06-24 MED ORDER — BIVALIRUDIN 250 MG IV SOLR
0.2500 mg/kg/h | INTRAVENOUS | Status: DC
  Filled 2014-06-24: qty 250

## 2014-06-24 MED ORDER — BIVALIRUDIN 250 MG IV SOLR
INTRAVENOUS | Status: AC
  Filled 2014-06-24: qty 250

## 2014-06-24 MED ORDER — MIDAZOLAM HCL 2 MG/2ML IJ SOLN
INTRAMUSCULAR | Status: AC
  Filled 2014-06-24: qty 2

## 2014-06-24 MED ORDER — INFLUENZA VAC SPLIT QUAD 0.5 ML IM SUSY
0.5000 mL | PREFILLED_SYRINGE | Freq: Once | INTRAMUSCULAR | Status: AC
  Administered 2014-06-25: 11:00:00 0.5 mL via INTRAMUSCULAR
  Filled 2014-06-24: qty 0.5

## 2014-06-24 MED ORDER — ASPIRIN EC 81 MG PO TBEC
81.0000 mg | DELAYED_RELEASE_TABLET | Freq: Every day | ORAL | Status: DC
  Administered 2014-06-25: 11:00:00 81 mg via ORAL
  Filled 2014-06-24 (×2): qty 1

## 2014-06-24 MED ORDER — LISINOPRIL 10 MG PO TABS
10.0000 mg | ORAL_TABLET | Freq: Every day | ORAL | Status: DC
  Filled 2014-06-24: qty 1

## 2014-06-24 MED ORDER — FENTANYL CITRATE 0.05 MG/ML IJ SOLN
INTRAMUSCULAR | Status: AC
  Filled 2014-06-24: qty 2

## 2014-06-24 MED ORDER — SODIUM CHLORIDE 0.9 % IV SOLN
INTRAVENOUS | Status: DC
  Administered 2014-06-24: 14:00:00 via INTRAVENOUS

## 2014-06-24 MED ORDER — ASPIRIN 81 MG PO CHEW: 81.0000 mg | CHEWABLE_TABLET | ORAL | Status: DC

## 2014-06-24 MED ORDER — NITROGLYCERIN 1 MG/10 ML FOR IR/CATH LAB
INTRA_ARTERIAL | Status: AC
  Filled 2014-06-24: qty 10

## 2014-06-24 NOTE — CV Procedure (Signed)
[name] is a 63 y.o. male   [id number]  [id number] LOCATION:  FACILITY: [facility]  PHYSICIAN: Troy Sine, MD, [facility] [birth year]   DATE OF PROCEDURE:  06/24/2014     CARDIAC CATHETERIZATION/PERCUTANEOUS CORONARY INTERVENTION    HISTORY:   Mr. Camerin Ladouceur is a 63 year old gentleman who is originally from [place].  In 1998 a bare metal 4.018 mm stent was placed in his proximal LAD at [facility].  He had done fairly well.  He has noticed recent chest pain, development, particularly at night.  He has had several episodes of being awakened from sleep.  His pain was nitrate responsive.  A nuclear perfusion study was interpreted as intermediate to high risk with moderate region of inferior- inferolateral ischemia.  He is now referred for definitive cardiac catheterization and possible percutaneous coronary intervention.   PROCEDURE:   The patient was brought to the second floor [facility] cath lab in the fasting state. The patient was premedicated with Versed 2 mg and fentanyl 50 mcg. A right radial approach was utilized after an Allen's test verified adequate circulation. The right radial artery was punctured via the Seldinger technique, and a 6 [place] Glidesheath Slender was inserted without difficulty.  A radial cocktail consisting of Verapamil, IV nitroglycerin, and lidocaine was administered.  Heparin 5000 units was administered. A safety J wire was advanced into the ascending aorta. Diagnostic catheterization was done with 5 [place] TIG 4.0 and JL 3.5 and ultimately JL 4.0 catheters. A 5 French pigtail catheter was used for left ventriculography.  With the demonstration of a 99% stenosis and a very large dominant RCA.  The angios were reviewed with the patient and the decision was made to proceed with percutaneous coronary intervention.  Max bolus plus infusion was administered.  Brilinta 180 mg was given for oral antiplatelet therapy.  The intervention was  performed with a 6 [place] JR4 right guiding catheter.  A Prowater wire was advanced down the RCA.  Predilatation was done with a 2.7512 mm Trek balloon at 8, 12, and 13 atm.  A Xiience Alpine 4.018 mm DES stent was then inserted and deployed at 13 and 15 atm.  A [place] Trek balloon 4.5 x 15 mm was used for post stent dilatation up to 15 atm, corresponding to 4.49 mm.  A TR radial band was applied for hemostasis. The patient left the catheterization laboratory in stable condition.   HEMODYNAMICS:   Central Aorta: 120/65  Left Ventricle: 120/22  ANGIOGRAPHY:   The left main coronary artery was angiographically normal short vessel which immediately bifurcated into the LAD and left circumflex coronary artery.   The LAD was a large caliber vessel.  The previously placed stent in the proximal LAD was widely patent.  The LAD gave rise to several diagonal branches and septal perforating arteries and extended to the LV apex and was otherwise free of significant obstructive disease..   The left circumflex coronary artery was angiographically normal and gave rise to one major bifurcating obtuse marginal branch.   The RCA was a large dominant vessel that had mild 20-30% proximal narrowing, luminal irregularities in the midsegment with 20-30% eccentric stenosis  and a subtotally occluded 99% stenosis proximal to an acute marginal branch before the acute margin. The RCA supplied a large PDA and PLA vessel.   Left ventriculography revealed normal global LV contractility without focal segmental wall motion abnormalities. There was no evidence for mitral regurgitation.  Ejection fraction was 60%.  Following  percutaneous coronary intervention of the RCA with ultimate insertion of a 4.018 mm Xience Alpine DES stent post dilated to 4.49 mm, the 99% stenosis was reduced to 0%.  There was brisk TIMI-3 flow. There was no evidence for dissection.   Total contrast used: 220 cc Omipaque  IMPRESSION:  Significant  coronary obstructive disease with a widely patent previously placed 4.018 mm bare-metal stent in the proximal LAD without restenosis in an otherwise normal LAD and circumflex system.  Dominant RCA with 20-30% proximal areas of narrowing with luminal irregularity, 20-30% eccentric narrowing segment followed by 99% stenosis proximal to the acute margin.  Normal LV function without focal wall motion abnormalities and an ejection fraction of 60%.  Successful PCI to the subtotal occlusion of the RCA with 99% stenosis being reduced to 0% with ultimate insertion of a Xience Alpine 4.018 mm DES stent post dilated to 4.49 mm.   RECOMMENDATION:  Patient will require dual antiplatelet therapy for minimum of a year in addition to aggressive lipid-lowering therapy, ACE inhibitor, and beta blocker therapy.  Weight loss and increased exercise will be recommended.   Troy Sine, MD, [facility] 06/24/2014 9:33 AM

## 2014-06-24 NOTE — Interval H&P Note (Signed)
Cath Lab Visit (complete for each Cath Lab visit)  Clinical Evaluation Leading to the Procedure:   ACS: No.  Non-ACS:    Anginal Classification: CCS III  Anti-ischemic medical therapy: Maximal Therapy (2 or more classes of medications)  Non-Invasive Test Results: Intermediate-risk stress test findings: cardiac mortality 1-3%/year  Prior CABG: No previous CABG      History and Physical Interval Note:  06/24/2014 7:57 AM  [name]  has presented today for surgery, with the diagnosis of cp  The various methods of treatment have been discussed with the patient and family. After consideration of risks, benefits and other options for treatment, the patient has consented to  Procedure(s): LEFT HEART CATHETERIZATION WITH CORONARY ANGIOGRAM (N/A) as a surgical intervention .  The patient's history has been reviewed, patient examined, no change in status, stable for surgery.  I have reviewed the patient's chart and labs.  Questions were answered to the patient's satisfaction.     KELLY,THOMAS A

## 2014-06-24 NOTE — Plan of Care (Signed)
Problem: Consults Goal: PCI Patient Education (See Patient Education module for education specifics.) Outcome: Completed/Met Date Met:  06/24/14

## 2014-06-24 NOTE — H&P (View-Only) (Signed)
HPI Patient is a 63 yo who has moved to [place] Jake Michaelis, MD) Patient with a history of CAD /  S/P stent to LAD in 1009    Myoview in 2011, no ischemia  Diaphragm attenuation. Ehoc:  LVEF 55%  November 2013 had stress test  Seen after by Dr Collier Bullock  Carotd USN mild atherosclerotic changes.      Weakness.  Chronic  Has seen neurology in [place] Back/neck surgeries   Weak all over  About time started Zetia He stopped Zetia and has noticed on change in his strength   I saw him last in June  Since seen he has had continued problems with his back He has noticed symptoms of neck pain that are worse with lying and get better when he stands up Over the last 2 wks this has gotten worse.  Now he has some spread to his anterior chest and arm Difficut to follow  Discomfort may improve with standing  One spell lasted a while  No SOB  No dizziness  When he first had intervention done he had back pain.  He was alos SOB with activty He is not that active now    No Known Allergies  Current Outpatient Prescriptions  Medication Sig Dispense Refill  . aspirin 325 MG EC tablet Take 325 mg by mouth daily.    Marland Kitchen atenolol (TENORMIN) 25 MG tablet Take 1 tablet (25 mg total) by mouth 2 (two) times daily. 180 tablet 3  . atorvastatin (LIPITOR) 10 MG tablet Take 0.5 tablets (5 mg total) by mouth daily. 30 tablet 6  . b complex vitamins tablet Take 1 tablet by mouth daily.    . Coenzyme Q10 (CO Q 10 PO) Take by mouth. 1 tab daily    . DiphenhydrAMINE HCl (BENADRYL ALLERGY PO) Take by mouth at bedtime as needed.    . folic acid (FOLVITE) 1 MG tablet Take 1 mg by mouth daily. OVER THE COUNTER    . GLUCOSAMINE CHONDROITIN COMPLX PO Take 1 tablet by mouth 2 (two) times daily.    Marland Kitchen ibuprofen (ADVIL,MOTRIN) 200 MG tablet Take 200 mg by mouth every 6 (six) hours as needed.    Marland Kitchen KRILL OIL PO Take by mouth. 1 tab daily    . lisinopril (PRINIVIL,ZESTRIL) 10 MG tablet Take 1 tablet (10 mg total) by mouth daily. 90 tablet 3   . saw palmetto 160 MG capsule Take 160 mg by mouth daily.     No current facility-administered medications for this visit.    Past Medical History  Diagnosis Date  . Coronary artery disease   . Spinal stenosis     Dr Vaughan Sine  surgery  . Myositis     chronic CK elevation ( 415 CK, ALL MM CK subtype, seen by Rheumatology and considered normal Variant)  . Snoring     sleep apnea  . Fever     [place] Fever  . Hyperlipidemia   . Palpable abd. aorta   . Palatal fistula   . Palpitations   . Dyspnea   . RBBB     Past Surgical History  Procedure Laterality Date  . Cervical fusion  09/2009  . Coronary angioplasty    . Lumbar spine surgery  5/13    Family History  Problem Relation Age of Onset  . Heart attack Mother   . Aneurysm Father     History   Social History  . Marital Status: Unknown    Spouse  Name: N/A    Number of Children: N/A  . Years of Education: N/A   Occupational History  . Not on file.   Social History Main Topics  . Smoking status: Former Research scientist (life sciences)  . Smokeless tobacco: Not on file  . Alcohol Use: Not on file  . Drug Use: Not on file  . Sexual Activity: Not on file   Other Topics Concern  . Not on file   Social History Narrative   Married, wife moving to [place]   Retired , prior occupation self   Patient is a former smoker   Alcohol use - yes   Regular exercise   Drug Use- no    Review of Systems:  All systems reviewed.  They are negative to the above problem except as previously stated.  Vital Signs: BP 140/84 mmHg  Pulse 70  Ht 5\' 10"  (1.778 m)  Wt 292 lb 6.4 oz (132.632 kg)  BMI 41.96 kg/m2  Physical Exam Patient is a morbidly obese 63 yo in  [place] HEENT:  Normocephalic, atraumatic. EOMI, PERRLA.  Neck: JVP is normal.  No bruits.  Lungs: clear to auscultation. No rales no wheezes.  Heart: Regular rate and rhythm. Normal S1, S2. No S3.   No significant murmurs. PMI not displaced.  Abdomen:  Supple, nontender. Normal bowel sounds. No  masses. No hepatomegaly.  Extremities:   Good distal pulses throughout. No lower extremity edema.  Musculoskeletal :moving all extremities. R arm/hand a little weaker than L Neuro:   alert and oriented x3.  CN II-XII grossly intact.   Assessment and Plan:  1.  CAD  I am not convinced current symptoms are cardiac in origin  Has signif back problmes which may explain   I would set up with lexiscan myoview  Keep on same regimen  Rx for NTG givin  2.  HL  Keep on lipitor  Will need to follow   3.  HTN  Fair control  Continue meds     4.  Weakness  Reports extensive eval in [place]  Stress test:  06/16/12.  Lexican negative.  Inferoseptal defect.  LVEF 56%  Echo:  12/03/2009:  Mild LVH  LVEF 55%  Abdominal USN  No aneurysm:  09/05/09   Carotid USN 09/05/09:  Mild atherosclerotic plaquing.    CXR 2011 with infiltrate RML    ABI 09/05/09  Normal rest stress ABI  Myositis that predated statin use.   Stent to LAD 1998  Radiculopathy L5 R   Spinal stenosis  Sleep apnea   ADDENDUM:  06/21/14  Patient underwent myoview stress test earlier this week.  This showed a large area of inferior/inferolateral ischemia consistent with scar with signif periinfarct ischemia  This is different from previous report.   Given this and sympotms being similar some in quality with previous would recomm L heart cath to define Patient familiar with procedure  Understands risks and agrees to proceed.  Sched for Mon 12/1    Dorris Carnes

## 2014-06-24 NOTE — Progress Notes (Addendum)
TR BAND REMOVAL  LOCATION:    right radial  DEFLATED PER PROTOCOL:    Yes.    TIME BAND OFF / DRESSING APPLIED:    1415  SITE UPON ARRIVAL:    Level 0  SITE AFTER BAND REMOVAL:    Level 0  REVERSE ALLEN'S TEST:     positive  CIRCULATION SENSATION AND MOVEMENT:    Within Normal Limits   Yes.    COMMENTS:   Rechecked site with no change noted in at 1445. CSMs wnls, pulses remain + 2 radial and ulnar, no bleeding or hematoma noted, dressing dry and intact.

## 2014-06-25 ENCOUNTER — Encounter (HOSPITAL_COMMUNITY): Payer: Self-pay | Admitting: Physician Assistant

## 2014-06-25 ENCOUNTER — Telehealth: Payer: Self-pay | Admitting: Internal Medicine

## 2014-06-25 DIAGNOSIS — Z955 Presence of coronary angioplasty implant and graft: Secondary | ICD-10-CM | POA: Diagnosis not present

## 2014-06-25 DIAGNOSIS — I25110 Atherosclerotic heart disease of native coronary artery with unstable angina pectoris: Secondary | ICD-10-CM | POA: Diagnosis not present

## 2014-06-25 DIAGNOSIS — I200 Unstable angina: Secondary | ICD-10-CM | POA: Diagnosis not present

## 2014-06-25 DIAGNOSIS — M609 Myositis, unspecified: Secondary | ICD-10-CM | POA: Diagnosis present

## 2014-06-25 DIAGNOSIS — G7241 Inclusion body myositis [IBM]: Secondary | ICD-10-CM | POA: Diagnosis present

## 2014-06-25 DIAGNOSIS — Z23 Encounter for immunization: Secondary | ICD-10-CM | POA: Diagnosis not present

## 2014-06-25 DIAGNOSIS — E785 Hyperlipidemia, unspecified: Secondary | ICD-10-CM | POA: Diagnosis not present

## 2014-06-25 DIAGNOSIS — R531 Weakness: Secondary | ICD-10-CM

## 2014-06-25 DIAGNOSIS — I251 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: Secondary | ICD-10-CM | POA: Diagnosis present

## 2014-06-25 DIAGNOSIS — I10 Essential (primary) hypertension: Secondary | ICD-10-CM | POA: Diagnosis present

## 2014-06-25 DIAGNOSIS — M4800 Spinal stenosis, site unspecified: Secondary | ICD-10-CM | POA: Diagnosis present

## 2014-06-25 LAB — BASIC METABOLIC PANEL
ANION GAP: 13 (ref 5–15)
BUN: 18 mg/dL (ref 6–23)
CO2: 23 mEq/L (ref 19–32)
Calcium: 9 mg/dL (ref 8.4–10.5)
Chloride: 103 mEq/L (ref 96–112)
Creatinine, Ser: 0.76 mg/dL (ref 0.50–1.35)
GFR calc Af Amer: >90 mL/min (ref >90)
Glucose, Bld: 85 mg/dL (ref 70–99)
POTASSIUM: 3.8 meq/L (ref 3.7–5.3)
Sodium: 139 mEq/L (ref 137–147)

## 2014-06-25 LAB — CBC
HCT: 36.9 % — ABNORMAL LOW (ref 39.0–52.0)
Hemoglobin: 12.3 g/dL — ABNORMAL LOW (ref 13.0–17.0)
MCH: 31.8 pg (ref 26.0–34.0)
MCHC: 33.3 g/dL (ref 30.0–36.0)
MCV: 95.3 fL (ref 78.0–100.0)
Platelets: 97 10*3/uL — ABNORMAL LOW (ref 150–400)
RBC: 3.87 MIL/uL — ABNORMAL LOW (ref 4.22–5.81)
RDW: 13 % (ref 11.5–15.5)
WBC: 5.3 10*3/uL (ref 4.0–10.5)

## 2014-06-25 MED ORDER — TICAGRELOR 90 MG PO TABS: 90.0000 mg | ORAL_TABLET | Freq: Two times a day (BID) | ORAL | Status: DC

## 2014-06-25 MED ORDER — ASPIRIN 81 MG PO TBEC: 81.0000 mg | DELAYED_RELEASE_TABLET | Freq: Every day | ORAL | Status: DC

## 2014-06-25 MED FILL — Sodium Chloride IV Soln 0.9%: INTRAVENOUS | Qty: 50 | Status: AC

## 2014-06-25 NOTE — Care Management Note (Signed)
    Page 1 of 1   06/25/2014     9:52:01 AM CARE MANAGEMENT NOTE 06/25/2014  Patient:  [name], [name]   Account Number:  [id number]  Date Initiated:  06/25/2014  Documentation initiated by:  Mariann Laster  Subjective/Objective Assessment:   CP     Action/Plan:   CM to follow for disposition needs   Anticipated DC Date:  06/25/2014   Anticipated DC Plan:  [place]  CM consult  Medication Assistance      Choice offered to / List presented to:             Status of service:  Completed, signed off Medicare Important Message given?   (If response is "NO", the following Medicare IM given date fields will be blank) Date Medicare IM given:   Medicare IM given by:   Date Additional Medicare IM given:   Additional Medicare IM given by:    Discharge Disposition:  HOME/SELF CARE  Per UR Regulation:    If discussed at Long Length of Stay Meetings, dates discussed:    Comments:  Benefits Check ticagrelor (BRILINTA) tablet 90 mg bid Co-pay, coverage, deductibles, authorizations. pharmacy Thanks, Mariann Laster RN, BSN, MSHL, CCM 06/25/2014

## 2014-06-25 NOTE — Discharge Instructions (Signed)
Radial Site Care °Refer to this sheet in the next few weeks. These instructions provide you with information on caring for yourself after your procedure. Your caregiver may also give you more specific instructions. Your treatment has been planned according to current medical practices, but problems sometimes occur. Call your caregiver if you have any problems or questions after your procedure. °HOME CARE INSTRUCTIONS °· You may shower the day after the procedure. Remove the bandage (dressing) and gently wash the site with plain soap and water. Gently pat the site dry.  °· Do not apply powder or lotion to the site.  °· Do not submerge the affected site in water for 3 to 5 days.  °· Inspect the site at least twice daily.  °· Do not flex or bend the affected arm for 24 hours.  °· No lifting over 5 pounds (2.3 kg) for 5 days after your procedure.  °· Do not drive home if you are discharged the same day of the procedure. Have someone else drive you.  °· You may drive 24 hours after the procedure unless otherwise instructed by your caregiver.  °What to expect: °· Any bruising will usually fade within 1 to 2 weeks.  °· Blood that collects in the tissue (hematoma) may be painful to the touch. It should usually decrease in size and tenderness within 1 to 2 weeks.  °SEEK IMMEDIATE MEDICAL CARE IF: °· You have unusual pain at the radial site.  °· You have redness, warmth, swelling, or pain at the radial site.  °· You have drainage (other than a small amount of blood on the dressing).  °· You have chills.  °· You have a fever or persistent symptoms for more than 72 hours.  °· You have a fever and your symptoms suddenly get worse.  °· Your arm becomes pale, cool, tingly, or numb.  °· You have heavy bleeding from the site. Hold pressure on the site.  °·  °

## 2014-06-25 NOTE — Telephone Encounter (Signed)
TCM per Katie   Pt is TCM   Appt Scheduled on 07/02/2014 @ 12:10pm

## 2014-06-25 NOTE — Discharge Summary (Signed)
Discharge Summary   Patient ID: [name] MRN: [id number], DOB/AGE: 11-11-1950 63 y.o. Admit date: 06/24/2014 D/C date:     06/25/2014  Primary Cardiologist: Dr. Harrington Challenger  Principal Problem:   Unstable angina Active Problems:   Stented coronary artery   Myositis   Hyperlipidemia   Coronary artery disease   Spinal stenosis   Weakness   HTN (hypertension)    Admission Dates: 06/24/14-06/25/14 Discharge Diagnosis: [place] s/p DES to subtotal occlusion of RCA  HPI: [name] is a 63 y.o. male with a history of CAD s/p BMS to LAD (1998), HLD, HTN, chronic neurologic weakness and spinal stenosis who was admitted to [facility] on 06/24/14 for elective cardiac catheterization following an abnormal nuclear stress test and complaints of chest/shoulder pain.    Hospital Course  [place]/CAD- Nuclear stress test on 06/17/14 revaled a large area of inferior/inferolateral ischemia consistent with scar with signif perinfarct ischemia, which was different from previous NST report.  -- LHC on 06/24/14 with patent BMS to pLAD and successful PCI with DES to subtotal occlusion of RCA -- Continue ASA/Brilinta, atorvastatin, atenolol and lisinopril. DAPT to be continued for at least on year.  -- Radial site stable  HLD- LDL 129 on 12/2013. Patient has chronically elevated CK and myositis predating use of statin. He has been on low dose of atorvatatin and tolerating this well. -- Continue atorvastatin 10mg . Consider follow up lipid panel and CK at next follow up appointment   HTN- cont BB and ACE  The patient has had an uncomplicated hospital course and is recovering well. The radial catheter site is stable. He has been seen by Dr. Tamala Julian today and deemed ready for discharge home. All follow-up appointments have been scheduled. A written RX for a 30 day free supply of Brilinta was provided for the patient. Discharge medications are listed below.   Discharge Vitals: Blood pressure 152/71, pulse 78, temperature  97.9 F (36.6 C), temperature source Oral, resp. rate 18, height 5\' 11"  (1.803 m), weight 284 lb 13.4 oz (129.2 kg), SpO2 97 %.  Labs: Lab Results  Component Value Date   WBC 5.3 06/25/2014   HGB 12.3* 06/25/2014   HCT 36.9* 06/25/2014   MCV 95.3 06/25/2014   PLT 97* 06/25/2014     Recent Labs Lab 06/25/14 0323  NA 139  K 3.8  CL 103  CO2 23  BUN 18  CREATININE 0.76  CALCIUM 9.0  GLUCOSE 85    Lab Results  Component Value Date   CHOL 186 01/02/2014   HDL 41.90 01/02/2014   LDLCALC 129* 01/02/2014   TRIG 76.0 01/02/2014     Diagnostic Studies/Procedures   LHC 06/24/14 ANGIOGRAPHY:  The left main coronary artery was angiographically normal short vessel which immediately bifurcated into the LAD and left circumflex coronary artery.  The LAD was a large caliber vessel. The previously placed stent in the proximal LAD was widely patent. The LAD gave rise to several diagonal branches and septal perforating arteries and extended to the LV apex and was otherwise free of significant obstructive disease..  The left circumflex coronary artery was angiographically normal and gave rise to one major bifurcating obtuse marginal branch.  The RCA was a large dominant vessel that had mild 20-30% proximal narrowing, luminal irregularities in the midsegment with 20-30% eccentric stenosis and a subtotally occluded 99% stenosis proximal to an acute marginal branch before the acute margin. The RCA supplied a large PDA and PLA vessel.  Left ventriculography revealed normal  global LV contractility without focal segmental wall motion abnormalities. There was no evidence for mitral regurgitation. Ejection fraction was 60%. Following percutaneous coronary intervention of the RCA with ultimate insertion of a 4.018 mm Xience Alpine DES stent post dilated to 4.49 mm, the 99% stenosis was reduced to 0%. There was brisk TIMI-3 flow. There was no evidence for dissection. IMPRESSION: Significant  coronary obstructive disease with a widely patent previously placed 4.018 mm bare-metal stent in the proximal LAD without restenosis in an otherwise normal LAD and circumflex system. Dominant RCA with 20-30% proximal areas of narrowing with luminal irregularity, 20-30% eccentric narrowing segment followed by 99% stenosis proximal to the acute margin. Normal LV function without focal wall motion abnormalities and an ejection fraction of 60%. Successful PCI to the subtotal occlusion of the RCA with 99% stenosis being reduced to 0% with ultimate insertion of a Xience Alpine 4.018 mm DES stent post dilated to 4.49 mm. RECOMMENDATION: Patient will require dual antiplatelet therapy for minimum of a year in addition to aggressive lipid-lowering therapy, ACE inhibitor, and beta blocker therapy. Weight loss and increased exercise will be recommended.    Discharge Medications     Medication List    STOP taking these medications        ibuprofen 200 MG tablet  Commonly known as:  ADVIL,MOTRIN      TAKE these medications        aspirin 81 MG EC tablet  Take 1 tablet (81 mg total) by mouth daily.  Notes to Patient:  Keep stent from closing     atenolol 25 MG tablet  Commonly known as:  TENORMIN  Take 1 tablet (25 mg total) by mouth 2 (two) times daily.     atorvastatin 10 MG tablet  Commonly known as:  LIPITOR  Take 0.5 tablets (5 mg total) by mouth daily.     b complex vitamins tablet  Take 1 tablet by mouth daily.     BENADRYL ALLERGY PO  Take 2 tablets by mouth at bedtime as needed (for sleep).     CO Q 10 PO  Take 1 tablet by mouth daily.     FOLIC ACID PO  Take 1 tablet by mouth daily.     GLUCOSAMINE CHONDROITIN COMPLX PO  Take 1 tablet by mouth 2 (two) times daily.     KRILL OIL PO  Take 1 tablet by mouth daily. 1 tab daily     lisinopril 10 MG tablet  Commonly known as:  PRINIVIL,ZESTRIL  Take 1 tablet (10 mg total) by mouth daily.     nitroGLYCERIN 0.4 MG SL  tablet  Commonly known as:  NITROSTAT  Place 1 tablet (0.4 mg total) under the tongue every 5 (five) minutes as needed for chest pain.     saw palmetto 160 MG capsule  Take 160 mg by mouth daily.     ticagrelor 90 MG Tabs tablet  Commonly known as:  BRILINTA  Take 1 tablet (90 mg total) by mouth 2 (two) times daily.        Disposition   The patient will be discharged in stable condition to home. Discharge Instructions    Amb Referral to Cardiac Rehabilitation    Complete by:  As directed           Follow-up Information    Follow up with Richardson Dopp, PA-C On 07/02/2014.   Specialty:  Physician Assistant   Why:  @ 12:10   Contact information:   [id number] [address] [phone]         Duration of Discharge Encounter: Greater than 30 minutes including physician and PA time.  Mable Fill R PA-C 06/25/2014, 11:46 AM

## 2014-06-25 NOTE — Progress Notes (Signed)
CARDIAC REHAB PHASE I   PRE:  Rate/Rhythm: 77 SR  BP:  Supine:   Sitting: 152/71  Standing:    SaO2:   MODE:  Ambulation: 200 ft   POST:  Rate/Rhythm: 97 SR  BP:  Supine:   Sitting: 195/82  Standing:    SaO2: 96 RA [id number] Pt able to walk independently, he has awkward gait due to his nerve pain. He was able to walk 200 feet, then c/o of weakness and stated he had to return to room. He denies any chest discomfort.BP after walk 195/82, rechecked after he rested 168/93. Reported to RN. Completed stent education with pt. He voices understanding. Pt voices interest in attend ing Outpt. CRP, will send referral to [facility] program. I did not give him exercise guidelines. He admits to frequent falls due to the nerve pain. Pt has ordered himself a rollator and is awaiting it to arrive.I have encouraged him to walk what he is able to.  Rodney Langton RN 06/25/2014 9:43 AM

## 2014-06-25 NOTE — Progress Notes (Signed)
Patient Name: [name] Date of Encounter: 06/25/2014     Active Problems:   Unstable angina    SUBJECTIVE  No CP. Some mild SOB. Feeling well and ready [place] home.   CURRENT MEDS . aspirin EC  81 mg Oral Daily  . atenolol  25 mg Oral BID  . atorvastatin  5 mg Oral q1800  . Influenza vac split quadrivalent PF  0.5 mL Intramuscular Once  . lisinopril  10 mg Oral Daily  . ticagrelor  90 mg Oral BID    OBJECTIVE  Filed Vitals:   06/24/14 2023 06/25/14 0023 06/25/14 0611 06/25/14 0805  BP: 155/74 159/76 166/86 152/71  Pulse: 72 64 73 78  Temp: 97.5 F (36.4 C) 97.9 F (36.6 C) 97.3 F (36.3 C) 97.9 F (36.6 C)  TempSrc: Oral Oral Oral Oral  Resp: 16 16 20 18   Height:      Weight:  284 lb 13.4 oz (129.2 kg)    SpO2: 99% 99% 95% 97%    Intake/Output Summary (Last 24 hours) at 06/25/14 0942 Last data filed at 06/25/14 0900  Gross per 24 hour  Intake 2772.5 ml  Output   2300 ml  Net  472.5 ml   Filed Weights   06/24/14 0543 06/25/14 0023  Weight: 285 lb (129.275 kg) 284 lb 13.4 oz (129.2 kg)    PHYSICAL EXAM  General: Pleasant, NAD. obese Neuro: Alert and oriented X 3. Moves all extremities spontaneously. Psych: Normal affect. HEENT:  Normal  Neck: Supple without bruits or JVD. Lungs:  Resp regular and unlabored, CTA. Heart: RRR no s3, s4, or murmurs. Abdomen: Soft, non-tender, non-distended, BS + x 4.  Extremities: No clubbing, cyanosis or edema. DP/PT/Radials 2+ and equal bilaterally.  Accessory Clinical Findings  CBC  Recent Labs  06/25/14 0323  WBC 5.3  HGB 12.3*  HCT 36.9*  MCV 95.3  PLT 97*   Basic Metabolic Panel  Recent Labs  06/25/14 0323  NA 139  K 3.8  CL 103  CO2 23  GLUCOSE 85  BUN 18  CREATININE 0.76  CALCIUM 9.0    TELE  NSR with some PVCs  Radiology/Studies   LHC 06/24/14 ANGIOGRAPHY:  The left main coronary artery was angiographically normal short vessel which immediately bifurcated into the LAD and left  circumflex coronary artery.  The LAD was a large caliber vessel. The previously placed stent in the proximal LAD was widely patent. The LAD gave rise to several diagonal branches and septal perforating arteries and extended to the LV apex and was otherwise free of significant obstructive disease..  The left circumflex coronary artery was angiographically normal and gave rise to one major bifurcating obtuse marginal branch.  The RCA was a large dominant vessel that had mild 20-30% proximal narrowing, luminal irregularities in the midsegment with 20-30% eccentric stenosis and a subtotally occluded 99% stenosis proximal to an acute marginal branch before the acute margin. The RCA supplied a large PDA and PLA vessel.  Left ventriculography revealed normal global LV contractility without focal segmental wall motion abnormalities. There was no evidence for mitral regurgitation. Ejection fraction was 60%. Following percutaneous coronary intervention of the RCA with ultimate insertion of a 4.018 mm Xience Alpine DES stent post dilated to 4.49 mm, the 99% stenosis was reduced to 0%. There was brisk TIMI-3 flow. There was no evidence for dissection. IMPRESSION: Significant coronary obstructive disease with a widely patent previously placed 4.018 mm bare-metal stent in the proximal LAD without restenosis in  an otherwise normal LAD and circumflex system. Dominant RCA with 20-30% proximal areas of narrowing with luminal irregularity, 20-30% eccentric narrowing segment followed by 99% stenosis proximal to the acute margin. Normal LV function without focal wall motion abnormalities and an ejection fraction of 60%. Successful PCI to the subtotal occlusion of the RCA with 99% stenosis being reduced to 0% with ultimate insertion of a Xience Alpine 4.018 mm DES stent post dilated to 4.49 mm.   RECOMMENDATION:  Patient will require dual antiplatelet therapy for minimum of a year in addition to aggressive  lipid-lowering therapy, ACE inhibitor, and beta blocker therapy. Weight loss and increased exercise will be recommended.  ASSESSMENT AND PLAN  [name] is a 63 y.o. male with a history of CAD s/p BMS to LAD (1998), HLD, HTN, weakness and spinal stenosis who was admitted to [facility] on 06/24/14 for elective cardiac catheterization following an abnormal nuclear stress test and complaints of chest pain.   USA/CAD- Nuclear stress test on 06/17/14 revaled a large area of inferior/inferolateral ischemia consistent with scar with signif perinfarct ischemia, which was different from previous NST report.  -- LHC on 06/24/14 with patent BMS to pLAD and successful PCI with DES to subtotal occlusion of RCA -- Continue ASA/Brilinta, atorvastatin, atenolol and lisinopril. DAPT to be continued for at least on year.   HLD- continue statin   HTN- cont BB and ACE    Signed, Eileen Stanford PA-C  Pager [phone]

## 2014-06-26 NOTE — Telephone Encounter (Signed)
TCM call, pt has medications, reviewed new med  brilinta and is taking as prescribed. Pt aware of f/u date and time. Encouraged him to call with further questions or calls. Pt is feeling well.

## 2014-07-02 ENCOUNTER — Encounter: Payer: Self-pay | Admitting: Physician Assistant

## 2014-07-02 ENCOUNTER — Ambulatory Visit (INDEPENDENT_AMBULATORY_CARE_PROVIDER_SITE_OTHER): Payer: Medicare Other | Admitting: Physician Assistant

## 2014-07-02 VITALS — BP 130/80 | HR 60 | Ht 71.0 in | Wt 289.0 lb

## 2014-07-02 DIAGNOSIS — R748 Abnormal levels of other serum enzymes: Secondary | ICD-10-CM

## 2014-07-02 DIAGNOSIS — I251 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: Secondary | ICD-10-CM

## 2014-07-02 DIAGNOSIS — I10 Essential (primary) hypertension: Secondary | ICD-10-CM

## 2014-07-02 DIAGNOSIS — E785 Hyperlipidemia, unspecified: Secondary | ICD-10-CM

## 2014-07-02 DIAGNOSIS — R0600 Dyspnea, unspecified: Secondary | ICD-10-CM

## 2014-07-02 NOTE — Patient Instructions (Signed)
Schedule follow up with Dr. Dorris Carnes in 2 months.  Please do the following lab tests some time in the next 2-3 weeks: Lipids, LFTs, Total CK (please come fasting - do not eat or drink anything after midnight)

## 2014-07-02 NOTE — Progress Notes (Signed)
Cardiology Office Note   Date:  07/02/2014   ID:  [name], DOB [birth year], MRN [id number]  PCP:  No primary care provider on file.  Cardiologist:  Dr. Dorris Carnes      History of Present Illness: [name] is a 63 y.o. male with a hx of CAD s/p stent to LAD in 2009, HTN, HL, spinal stenosis.  He moved here from [place] in about one year ago.  He recently underwent a nuclear stress test for chest pain.  This was abnormal and he was set up for a LHC which demonstrated subtotal occlusion of the mid to dist RCA.  This was treated with a DES.  He was placed on Brilinta + ASA.  He returns for FU.    Patient tells me he actually had intrascapular pain as his anginal equivalent. This is resolved post PCI. 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 This is unchanged. He denies orthopnea, PND. He has mild ankle edema without significant change. He denies syncope.   Studies:   - LHC (06/24/14):  pLAD stent ok, pRCA 20-30%, mLAD 20-30%, pRCA 20-30%, mRCA 20-30% followed by 99% before AM, EF 60% >> PCI:  4 x 18 mm Xience Alpine DES  - Echo (5/11):  Mild LVH, EF 55%  - Abdominal US (2/11): No AAA  - Nuclear (11/15):  Inf-lateral and inferior infarct with peri-infarct ischemia, EF 51%; Intermediate to High Risk  - Carotid US (2/11):  Mild a throat sclerotic plaquing  - ABI (2/11): Normal   Recent Labs: 01/02/2014: LDL (calc) 129* 06/25/2014: BUN 18; Creatinine 0.76; Hemoglobin 12.3*; Potassium 3.8; Sodium 139    Recent Radiology: No results found.    Wt Readings from Last 3 Encounters:  07/02/14 289 lb (131.09 kg)  06/25/14 284 lb 13.4 oz (129.2 kg)  06/17/14 288 lb (130.636 kg)     Past Medical History  Diagnosis Date  . Coronary artery disease     a. s/p BMS to LAD (1998) b. s/p DES to RCA (06/24/14)  . Spinal stenosis     Dr Vaughan Sine  surgery  . Myositis     chronic CK elevation ( 415 CK, ALL MM CK subtype, seen by Rheumatology and considered normal  Variant)  . Hyperlipidemia   . Palpable abd. aorta   . Palatal fistula   . Dyspnea   . RBBB   . Weakness   . HTN (hypertension)     Current Outpatient Prescriptions  Medication Sig Dispense Refill  . aspirin EC 81 MG EC tablet Take 1 tablet (81 mg total) by mouth daily.    Marland Kitchen atenolol (TENORMIN) 25 MG tablet Take 1 tablet (25 mg total) by mouth 2 (two) times daily. 180 tablet 3  . atorvastatin (LIPITOR) 10 MG tablet Take 0.5 tablets (5 mg total) by mouth daily. 30 tablet 6  . b complex vitamins tablet Take 1 tablet by mouth daily.    . Coenzyme Q10 (CO Q 10 PO) Take 1 tablet by mouth daily.     . DiphenhydrAMINE HCl (BENADRYL ALLERGY PO) Take 2 tablets by mouth at bedtime as needed (for sleep).     . FOLIC ACID PO Take 1 tablet by mouth daily.    Marland Kitchen GLUCOSAMINE CHONDROITIN COMPLX PO Take 1 tablet by mouth 2 (two) times daily.    Marland Kitchen KRILL OIL PO Take 1 tablet by mouth daily. 1 tab daily    . lisinopril (PRINIVIL,ZESTRIL) 10 MG tablet Take 1 tablet (10 mg  total) by mouth daily. 90 tablet 3  . nitroGLYCERIN (NITROSTAT) 0.4 MG SL tablet Place 1 tablet (0.4 mg total) under the tongue every 5 (five) minutes as needed for chest pain. 90 tablet 3  . saw palmetto 160 MG capsule Take 160 mg by mouth daily.    . ticagrelor (BRILINTA) 90 MG TABS tablet Take 1 tablet (90 mg total) by mouth 2 (two) times daily. 60 tablet 11   No current facility-administered medications for this visit.     Allergies:   Review of patient's allergies indicates no known allergies.   Social History:  The patient  reports that he quit smoking about 24 years ago. He has never used smokeless tobacco. He reports that he drinks alcohol. He reports that he does not use illicit drugs.   Family History:  The patient's family history includes Aneurysm in his father; Heart attack in his mother.    ROS:  Please see the history of present illness.       All other systems reviewed and negative.    PHYSICAL EXAM: VS:  BP  130/80 mmHg  Pulse 60  Ht 5\' 11"  (1.803 m)  Wt 289 lb (131.09 kg)  BMI 40.33 kg/m2  SpO2 96% Well nourished, well developed, in no acute distress HEENT: normal Neck:  no JVD Cardiac:  normal S1, S2;  RRR; no  murmur   Lungs:   clear to auscultation bilaterally, no wheezing, rhonchi or rales Abd: soft, nontender, no hepatomegaly Ext: no edema Skin: warm and dry Neuro:  CNs 2-12 intact, no focal abnormalities noted  EKG:  NSR, HR 61, LAD, RBBB, T-wave inversions in 3 and aVF, no change from prior tracing      ASSESSMENT AND PLAN:  1.  Coronary artery disease:  He is doing well after recent PCI to the RCA with a DES. We discussed the importance of dual antiplatelet therapy. Continue aspirin, Brilinta, beta blocker, statin. 2.  HLD (hyperlipidemia):  Continue statin. Obtain follow-up lipids and LFTs.  3.  Elevated CK:  Obtain follow-up total CK 4.  Dyspnea:  This is chronic from known paralyzed right hemidiaphragm. Symptoms are unchanged.  5.  Essential hypertension:   controlled.   Disposition:   FU with  Dr. Harrington Challenger 2 months.   Signed, Versie Starks, MHS 07/02/2014 1:05 PM    [facility] [place], [place], [place]  [address] Phone: [phone]; Fax: [phone]

## 2014-07-04 ENCOUNTER — Encounter (HOSPITAL_COMMUNITY): Payer: Self-pay | Admitting: Cardiovascular Disease

## 2014-07-04 ENCOUNTER — Ambulatory Visit: Payer: Medicare Other | Admitting: Internal Medicine

## 2014-07-16 ENCOUNTER — Telehealth: Payer: Self-pay | Admitting: *Deleted

## 2014-07-16 ENCOUNTER — Other Ambulatory Visit (INDEPENDENT_AMBULATORY_CARE_PROVIDER_SITE_OTHER): Payer: Medicare Other | Admitting: *Deleted

## 2014-07-16 DIAGNOSIS — E785 Hyperlipidemia, unspecified: Secondary | ICD-10-CM

## 2014-07-16 DIAGNOSIS — I251 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: Secondary | ICD-10-CM

## 2014-07-16 DIAGNOSIS — R748 Abnormal levels of other serum enzymes: Secondary | ICD-10-CM

## 2014-07-16 LAB — HEPATIC FUNCTION PANEL
ALBUMIN: 4 g/dL (ref 3.5–5.2)
ALT: 22 U/L (ref 0–53)
AST: 21 U/L (ref 0–37)
Alkaline Phosphatase: 45 U/L (ref 39–117)
Bilirubin, Direct: 0 mg/dL (ref 0.0–0.3)
Total Bilirubin: 0.9 mg/dL (ref 0.2–1.2)
Total Protein: 6.4 g/dL (ref 6.0–8.3)

## 2014-07-16 LAB — LIPID PANEL
CHOLESTEROL: 163 mg/dL (ref 0–200)
HDL: 32.2 mg/dL — AB (ref >39.00)
LDL Cholesterol: 106 mg/dL — ABNORMAL HIGH (ref 0–99)
NonHDL: 130.8
Total CHOL/HDL Ratio: 5
Triglycerides: 124 mg/dL (ref 0.0–149.0)
VLDL: 24.8 mg/dL (ref 0.0–40.0)

## 2014-07-16 LAB — CK: Total CK: 284 U/L — ABNORMAL HIGH (ref 7–232)

## 2014-07-16 NOTE — Telephone Encounter (Signed)
lmptcb to go over lab results and to discuss about increasing lipitor to 10 mg daily.

## 2014-07-17 MED ORDER — ATORVASTATIN CALCIUM 10 MG PO TABS: 10.0000 mg | ORAL_TABLET | Freq: Every day | ORAL | Status: DC

## 2014-07-17 NOTE — Telephone Encounter (Signed)
Pt notified about lab results today and is agreeable to increasing lipitor to 10 mg daily. repeat labs 09/16/14. Pt verbalized understanding to Plan of Care. New Rx sent in today to [facility] in [place] per pt request.

## 2014-07-24 DIAGNOSIS — R0600 Dyspnea, unspecified: Secondary | ICD-10-CM | POA: Diagnosis not present

## 2014-07-24 DIAGNOSIS — E785 Hyperlipidemia, unspecified: Secondary | ICD-10-CM | POA: Diagnosis not present

## 2014-07-24 DIAGNOSIS — R748 Abnormal levels of other serum enzymes: Secondary | ICD-10-CM

## 2014-07-24 DIAGNOSIS — I10 Essential (primary) hypertension: Secondary | ICD-10-CM

## 2014-07-24 DIAGNOSIS — I251 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: Secondary | ICD-10-CM | POA: Diagnosis not present

## 2014-09-16 ENCOUNTER — Ambulatory Visit: Payer: Medicare Other | Admitting: Internal Medicine

## 2014-09-16 ENCOUNTER — Other Ambulatory Visit: Payer: Medicare Other

## 2014-09-26 ENCOUNTER — Ambulatory Visit: Payer: Medicare Other | Admitting: Internal Medicine

## 2014-09-26 ENCOUNTER — Other Ambulatory Visit: Payer: Medicare Other

## 2014-10-03 ENCOUNTER — Other Ambulatory Visit (INDEPENDENT_AMBULATORY_CARE_PROVIDER_SITE_OTHER): Payer: Medicare Other | Admitting: *Deleted

## 2014-10-03 DIAGNOSIS — E785 Hyperlipidemia, unspecified: Secondary | ICD-10-CM

## 2014-10-03 DIAGNOSIS — I251 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: Secondary | ICD-10-CM

## 2014-10-03 LAB — HEPATIC FUNCTION PANEL
ALT: 20 U/L (ref 0–53)
AST: 16 U/L (ref 0–37)
Albumin: 4.2 g/dL (ref 3.5–5.2)
Alkaline Phosphatase: 50 U/L (ref 39–117)
BILIRUBIN DIRECT: 0.2 mg/dL (ref 0.0–0.3)
TOTAL PROTEIN: 6.6 g/dL (ref 6.0–8.3)
Total Bilirubin: 0.8 mg/dL (ref 0.2–1.2)

## 2014-10-03 LAB — LIPID PANEL
CHOLESTEROL: 148 mg/dL (ref 0–200)
HDL: 38 mg/dL — ABNORMAL LOW (ref >39.00)
LDL Cholesterol: 74 mg/dL (ref 0–99)
NonHDL: 110
TRIGLYCERIDES: 181 mg/dL — AB (ref 0.0–149.0)
Total CHOL/HDL Ratio: 4
VLDL: 36.2 mg/dL (ref 0.0–40.0)

## 2014-10-04 ENCOUNTER — Telehealth: Payer: Self-pay | Admitting: *Deleted

## 2014-10-04 NOTE — Telephone Encounter (Signed)
pt notified about lab results with verbal understanding. pt advised to keep working on diet to help bring Trigs down. Pt agreeable to plan of care.

## 2014-10-04 NOTE — Telephone Encounter (Signed)
pt notfied about Total CK normal. I will send a credit sheet over to the lab to credit for the CKMB, lab was supposed to only be a Total CK, I entered in error a CKMB.

## 2014-10-07 ENCOUNTER — Ambulatory Visit (INDEPENDENT_AMBULATORY_CARE_PROVIDER_SITE_OTHER): Payer: Medicare Other | Admitting: Internal Medicine

## 2014-10-07 ENCOUNTER — Other Ambulatory Visit: Payer: Medicare Other

## 2014-10-07 ENCOUNTER — Encounter: Payer: Self-pay | Admitting: Internal Medicine

## 2014-10-07 VITALS — BP 119/70 | HR 70 | Ht 71.0 in | Wt 289.0 lb

## 2014-10-07 DIAGNOSIS — I251 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: Secondary | ICD-10-CM

## 2014-10-07 DIAGNOSIS — E785 Hyperlipidemia, unspecified: Secondary | ICD-10-CM

## 2014-10-07 DIAGNOSIS — I10 Essential (primary) hypertension: Secondary | ICD-10-CM | POA: Diagnosis not present

## 2014-10-07 DIAGNOSIS — G729 Myopathy, unspecified: Secondary | ICD-10-CM

## 2014-10-07 LAB — CK TOTAL AND CKMB (NOT AT ARMC): CK TOTAL: 138 U/L (ref 7–232)

## 2014-10-07 MED ORDER — PRASUGREL HCL 10 MG PO TABS: 10.0000 mg | ORAL_TABLET | Freq: Every day | ORAL | Status: DC

## 2014-10-07 MED ORDER — CIPROFLOXACIN HCL 500 MG PO TABS: 500.0000 mg | ORAL_TABLET | Freq: Two times a day (BID) | ORAL | Status: DC

## 2014-10-07 MED ORDER — ATENOLOL 25 MG PO TABS: 25.0000 mg | ORAL_TABLET | Freq: Two times a day (BID) | ORAL | Status: DC

## 2014-10-07 MED ORDER — LISINOPRIL 10 MG PO TABS: 10.0000 mg | ORAL_TABLET | Freq: Every day | ORAL | Status: DC

## 2014-10-07 NOTE — Patient Instructions (Signed)
Your physician has recommended you make the following change in your medication:  1.) stop brilinta 2.) start effient one tablet daily  Your physician wants you to follow-up in: 6 months with Dr. Harrington Challenger.  You will receive a reminder letter in the mail two months in advance. If you don't receive a letter, please call our office to schedule the follow-up appointment.

## 2014-10-07 NOTE — Progress Notes (Signed)
Cardiology Office Note   Date:  10/08/2014   ID:  Massey Ruhland, DOB 1950/09/12, MRN [id number]  PCP:  No primary care provider on file.  Cardiologist:   Dorris Carnes, MD   Chief Complaint  Patient presents with  . Coronary Artery Disease      History of Present Illness: Royden Bulman is a 64 y.o. male with a history of coronary obstructive disease  I saw him earlier this winter  He underwent cardiac catheterization sine that  This showed: with a widely patent previously placed 4.018 mm bare-metal stent in the proximal LAD without restenosis in an otherwise normal LAD and circumflex system. Dominant RCA with 20-30% proximal areas of narrowing with luminal irregularity, 20-30% eccentric narrowing segment followed by 99% stenosis proximal to the acute margin.Normal LV function without focal wall motion abnormalities and an ejection fraction of 60%. The patinet underwent Successful PCI to the subtotal occlusion of the RCA with 99% stenosis being reduced to 0% with ultimate insertion of a Xience Alpine 4.018 mm DES stent post dilated to 4.49 mm.  Since then he has been seen by Kathleen Argue once   He currently denies intrascapular pain which appears to be his anginal equivalent.  Breathing has been a little short. He wonders if it is due to Brilinta  Does have some loose BM 1-2x per day   Current Outpatient Prescriptions  Medication Sig Dispense Refill  . aspirin EC 81 MG EC tablet Take 1 tablet (81 mg total) by mouth daily.    Marland Kitchen atenolol (TENORMIN) 25 MG tablet Take 1 tablet (25 mg total) by mouth 2 (two) times daily. 180 tablet 3  . atenolol (TENORMIN) 25 MG tablet Take 1 tablet (25 mg total) by mouth 2 (two) times daily. 180 tablet 3  . atorvastatin (LIPITOR) 10 MG tablet Take 1 tablet (10 mg total) by mouth daily. 90 tablet 3  . b complex vitamins tablet Take 1 tablet by mouth daily.    . Coenzyme Q10 (CO Q 10 PO) Take 1 tablet by mouth daily.     . DiphenhydrAMINE HCl (BENADRYL ALLERGY  PO) Take 2 tablets by mouth at bedtime as needed (for sleep).     . FOLIC ACID PO Take 1 tablet by mouth daily.    Marland Kitchen GLUCOSAMINE CHONDROITIN COMPLX PO Take 1 tablet by mouth 2 (two) times daily.    Marland Kitchen KRILL OIL PO Take 1 tablet by mouth daily. 1 tab daily    . lisinopril (PRINIVIL,ZESTRIL) 10 MG tablet Take 1 tablet (10 mg total) by mouth daily. 90 tablet 3  . nitroGLYCERIN (NITROSTAT) 0.4 MG SL tablet Place 1 tablet (0.4 mg total) under the tongue every 5 (five) minutes as needed for chest pain. 90 tablet 3  . saw palmetto 160 MG capsule Take 160 mg by mouth daily.    . ciprofloxacin (CIPRO) 500 MG tablet Take 1 tablet (500 mg total) by mouth 2 (two) times daily. 7 tablet 0  . prasugrel (EFFIENT) 10 MG TABS tablet Take 1 tablet (10 mg total) by mouth daily. 90 tablet 3   No current facility-administered medications for this visit.    Allergies:   Review of patient's allergies indicates no known allergies.   Past Medical History  Diagnosis Date  . Coronary artery disease     a. s/p BMS to LAD (1998) b. s/p DES to RCA (06/24/14)  . Spinal stenosis     Dr Vaughan Sine  surgery  . Myositis  chronic CK elevation ( 415 CK, ALL MM CK subtype, seen by Rheumatology and considered normal Variant)  . Hyperlipidemia   . Palpable abd. aorta   . Palatal fistula   . Dyspnea   . RBBB   . Weakness   . HTN (hypertension)     Past Surgical History  Procedure Laterality Date  . Cervical fusion  09/2009  . Coronary angioplasty    . Lumbar spine surgery  5/13  . Coronary stent placement  06/24/2014    DES to RCA  . Left heart catheterization with coronary angiogram N/A 06/24/2014    Procedure: LEFT HEART CATHETERIZATION WITH CORONARY ANGIOGRAM;  Surgeon: Troy Sine, MD;  Location: [facility] CATH LAB;  Service: Cardiovascular;  Laterality: N/A;  . Percutaneous coronary stent intervention (pci-s)  06/24/2014    Procedure: PERCUTANEOUS CORONARY STENT INTERVENTION (PCI-S);  Surgeon: Troy Sine, MD;   Location: [facility] CATH LAB;  Service: Cardiovascular;;  Distal RCA     Social History:  The patient  reports that he quit smoking about 25 years ago. He has never used smokeless tobacco. He reports that he drinks alcohol. He reports that he does not use illicit drugs.   Family History:  The patient's family history includes Aneurysm in his father; Cancer in his mother; Heart attack in his mother. There is no history of Stroke.    ROS:  Please see the history of present illness. All other systems are reviewed and  Negative to the above problem except as noted.    PHYSICAL EXAM: VS:  BP 119/70 mmHg  Pulse 70  Ht 5\' 11"  (1.803 m)  Wt 289 lb (131.09 kg)  BMI 40.33 kg/m2  GEN: Morbidly obese 64 yo in no acute distress HEENT: normal Neck: no JVD, carotid bruits, or masses Cardiac: RRR; no murmurs, rubs, or gallops,no edema  Respiratory:  clear to auscultation bilaterally, normal work of breathing GI: soft, nontender, nondistended, + BS  No hepatomegaly  MS: no deformity Moving all extremities   Skin: warm and dry, no rash Neuro:  Strength and sensation are intact Psych: euthymic mood, full affect   EKG:  EKG is not  ordered today.   Lipid Panel    Component Value Date/Time   CHOL 148 10/03/2014 0850   TRIG 181.0* 10/03/2014 0850   HDL 38.00* 10/03/2014 0850   CHOLHDL 4 10/03/2014 0850   VLDL 36.2 10/03/2014 0850   LDLCALC 74 10/03/2014 0850      Wt Readings from Last 3 Encounters:  10/07/14 289 lb (131.09 kg)  07/02/14 289 lb (131.09 kg)  06/25/14 284 lb 13.4 oz (129.2 kg)      ASSESSMENT AND PLAN:  1.  CAD Patinet without symptoms of angina  I would recomm he try Effient and follow breathing  If no change would continue either  2.  Gi  Will give 3 days of Cipro  Recomm probiotic  If no change will be in [place] where he can see his primary MD      Current medicines are reviewed at length with the patient today.  The patient does not have concerns regarding  medicines.  The following changes have been made: Trial of Effient  Labs/ tests ordered today include:  No labs  No orders of the defined types were placed in this encounter.     Disposition:  F/U in 6 months    Signed, Dorris Carnes, MD  10/08/2014 9:46 AM    [facility] [address] [place], [place]  [address] Phone: [phone]; Fax: [phone]

## 2014-12-31 ENCOUNTER — Telehealth: Payer: Self-pay | Admitting: Internal Medicine

## 2014-12-31 DIAGNOSIS — I10 Essential (primary) hypertension: Secondary | ICD-10-CM

## 2014-12-31 DIAGNOSIS — E785 Hyperlipidemia, unspecified: Secondary | ICD-10-CM

## 2014-12-31 NOTE — Telephone Encounter (Signed)
Pt called to make 6 mo recall appt and wants to know if he will need lab work

## 2014-12-31 NOTE — Telephone Encounter (Signed)
Will forward to Dr. Harrington Challenger for review and advisement on if she wants any labs drawn at follow up appt.

## 2014-12-31 NOTE — Telephone Encounter (Signed)
Spoke with pt and he will come in for lab work on March 25, 2015.

## 2014-12-31 NOTE — Telephone Encounter (Signed)
Set up for BMET, CBC and CK to be done just prior to clinic visit

## 2015-01-14 ENCOUNTER — Telehealth: Payer: Self-pay | Admitting: Internal Medicine

## 2015-01-14 NOTE — Telephone Encounter (Signed)
Pt called stating that his medication Effient 10 mg is costing to much and would like to know if Dr. Harrington Challenger would prescribe the pt something else that does not cost as much. Please advise

## 2015-01-14 NOTE — Telephone Encounter (Signed)
Pt had intervention in Nov 2015  Optimially he should be on Effient for 1 year bfore switching to plavix or stopping See if we can provide him with samples

## 2015-01-15 NOTE — Telephone Encounter (Signed)
Samples of Effient placed at front desk.  Pt will pick up today. Also advised on 30 day free trial card.

## 2015-03-10 DIAGNOSIS — L821 Other seborrheic keratosis: Secondary | ICD-10-CM | POA: Diagnosis not present

## 2015-03-10 DIAGNOSIS — L570 Actinic keratosis: Secondary | ICD-10-CM | POA: Diagnosis not present

## 2015-03-10 DIAGNOSIS — D692 Other nonthrombocytopenic purpura: Secondary | ICD-10-CM | POA: Diagnosis not present

## 2015-03-10 DIAGNOSIS — D225 Melanocytic nevi of trunk: Secondary | ICD-10-CM | POA: Diagnosis not present

## 2015-03-25 ENCOUNTER — Other Ambulatory Visit (INDEPENDENT_AMBULATORY_CARE_PROVIDER_SITE_OTHER): Payer: Medicare Other | Admitting: *Deleted

## 2015-03-25 DIAGNOSIS — E785 Hyperlipidemia, unspecified: Secondary | ICD-10-CM

## 2015-03-25 DIAGNOSIS — I10 Essential (primary) hypertension: Secondary | ICD-10-CM

## 2015-03-25 LAB — BASIC METABOLIC PANEL
BUN: 26 mg/dL — AB (ref 6–23)
CO2: 31 meq/L (ref 19–32)
Calcium: 9.4 mg/dL (ref 8.4–10.5)
Chloride: 103 mEq/L (ref 96–112)
Creatinine, Ser: 0.94 mg/dL (ref 0.40–1.50)
GFR: 85.18 mL/min (ref >60.00)
GLUCOSE: 95 mg/dL (ref 70–99)
POTASSIUM: 4 meq/L (ref 3.5–5.1)
SODIUM: 141 meq/L (ref 135–145)

## 2015-03-25 LAB — CBC
HCT: 41.6 % (ref 39.0–52.0)
HEMOGLOBIN: 14.2 g/dL (ref 13.0–17.0)
MCHC: 34.1 g/dL (ref 30.0–36.0)
MCV: 97.7 fl (ref 78.0–100.0)
Platelets: 120 10*3/uL — ABNORMAL LOW (ref 150.0–400.0)
RBC: 4.25 Mil/uL (ref 4.22–5.81)
RDW: 13.4 % (ref 11.5–15.5)
WBC: 4.9 10*3/uL (ref 4.0–10.5)

## 2015-03-25 LAB — CK: Total CK: 290 U/L — ABNORMAL HIGH (ref 7–232)

## 2015-03-25 NOTE — Addendum Note (Signed)
Addended by: Eulis Foster on: 03/25/2015 09:59 AM   Modules accepted: Orders

## 2015-03-28 ENCOUNTER — Ambulatory Visit (INDEPENDENT_AMBULATORY_CARE_PROVIDER_SITE_OTHER): Payer: Medicare Other | Admitting: Internal Medicine

## 2015-03-28 ENCOUNTER — Encounter: Payer: Self-pay | Admitting: Internal Medicine

## 2015-03-28 VITALS — BP 122/62 | HR 66 | Ht 71.0 in | Wt 300.0 lb

## 2015-03-28 DIAGNOSIS — E785 Hyperlipidemia, unspecified: Secondary | ICD-10-CM | POA: Diagnosis not present

## 2015-03-28 DIAGNOSIS — I251 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: Secondary | ICD-10-CM

## 2015-03-28 DIAGNOSIS — I10 Essential (primary) hypertension: Secondary | ICD-10-CM | POA: Diagnosis not present

## 2015-03-28 DIAGNOSIS — R079 Chest pain, unspecified: Secondary | ICD-10-CM | POA: Diagnosis not present

## 2015-03-28 NOTE — Progress Notes (Signed)
Cardiology Office Note   Date:  03/28/2015   ID:  [name], DOB [birth year], MRN [id number]  PCP:  No PCP Per Patient  Cardiologist:   Dorris Carnes, MD   No chief complaint on file.     History of Present Illness: [name] is a 64 y.o. male with a history of  [name] is a 64 y.o. male with a history of coronary obstructive disease I saw him earlier this winter He underwent cardiac catheterization sine that This showed: with a widely patent previously placed 4.018 mm bare-metal stent in the proximal LAD without restenosis in an otherwise normal LAD and circumflex system. Dominant RCA with 20-30% proximal areas of narrowing with luminal irregularity, 20-30% eccentric narrowing segment followed by 99% stenosis proximal to the acute margin.Normal LV function without focal wall motion abnormalities and an ejection fraction of 60%. The patinet underwent Successful PCI to the subtotal occlusion of the RCA with 99% stenosis being reduced to 0% with ultimate insertion of a Xience Alpine 4.018 mm DES stent post dilated to 4.49 mm.  I saw the pt in mrch  I switched him to Effient.   Blood work just done   He denis CP  Has been doing aqua exercises. Legs may be a little better Going to [place] in 2 wks for winter.     Current Outpatient Prescriptions  Medication Sig Dispense Refill  . aspirin EC 81 MG EC tablet Take 1 tablet (81 mg total) by mouth daily.    Marland Kitchen atenolol (TENORMIN) 25 MG tablet Take 1 tablet (25 mg total) by mouth 2 (two) times daily. 180 tablet 3  . atorvastatin (LIPITOR) 10 MG tablet Take 1 tablet (10 mg total) by mouth daily. 90 tablet 3  . b complex vitamins tablet Take 1 tablet by mouth daily.    . ciprofloxacin (CIPRO) 500 MG tablet Take 1 tablet (500 mg total) by mouth 2 (two) times daily. 7 tablet 0  . Coenzyme Q10 (CO Q 10 PO) Take 1 tablet by mouth daily.     . DiphenhydrAMINE HCl (BENADRYL ALLERGY PO) Take 2 tablets by mouth at bedtime as needed (for sleep).      . FOLIC ACID PO Take 1 tablet by mouth daily.    Marland Kitchen GLUCOSAMINE CHONDROITIN COMPLX PO Take 1 tablet by mouth 2 (two) times daily.    Marland Kitchen KRILL OIL PO Take 1 tablet by mouth daily. 1 tab daily    . lisinopril (PRINIVIL,ZESTRIL) 10 MG tablet Take 1 tablet (10 mg total) by mouth daily. 90 tablet 3  . MELATONIN PO Take by mouth as directed.    . nitroGLYCERIN (NITROSTAT) 0.4 MG SL tablet Place 1 tablet (0.4 mg total) under the tongue every 5 (five) minutes as needed for chest pain. 90 tablet 3  . prasugrel (EFFIENT) 10 MG TABS tablet Take 1 tablet (10 mg total) by mouth daily. 90 tablet 3  . saw palmetto 160 MG capsule Take 160 mg by mouth daily.     No current facility-administered medications for this visit.    Allergies:   Review of patient's allergies indicates no known allergies.   Past Medical History  Diagnosis Date  . Coronary artery disease     a. s/p BMS to LAD (1998) b. s/p DES to RCA (06/24/14)  . Spinal stenosis     Dr Vaughan Sine  surgery  . Myositis     chronic CK elevation ( 415 CK, ALL MM CK subtype, seen by Rheumatology  and considered normal Variant)  . Hyperlipidemia   . Palpable abd. aorta   . Palatal fistula   . Dyspnea   . RBBB   . Weakness   . HTN (hypertension)     Past Surgical History  Procedure Laterality Date  . Cervical fusion  09/2009  . Coronary angioplasty    . Lumbar spine surgery  5/13  . Coronary stent placement  06/24/2014    DES to RCA  . Left heart catheterization with coronary angiogram N/A 06/24/2014    Procedure: LEFT HEART CATHETERIZATION WITH CORONARY ANGIOGRAM;  Surgeon: Troy Sine, MD;  Location: [facility] CATH LAB;  Service: Cardiovascular;  Laterality: N/A;  . Percutaneous coronary stent intervention (pci-s)  06/24/2014    Procedure: PERCUTANEOUS CORONARY STENT INTERVENTION (PCI-S);  Surgeon: Troy Sine, MD;  Location: [facility] CATH LAB;  Service: Cardiovascular;;  Distal RCA     Social History:  The patient  reports that he quit  smoking about 25 years ago. He has never used smokeless tobacco. He reports that he drinks alcohol. He reports that he does not use illicit drugs.   Family History:  The patient's family history includes Aneurysm in his father; Cancer in his mother; Cystic fibrosis in his brother; Heart attack in his mother; Heart disease in his father. There is no history of Stroke.    ROS:  Please see the history of present illness. All other systems are reviewed and  Negative to the above problem except as noted.    PHYSICAL EXAM: VS:  BP 122/62 mmHg  Pulse 66  Ht 5\' 11"  (1.803 m)  Wt 300 lb (136.079 kg)  BMI 41.86 kg/m2  SpO2 96%  GEN: Well nourished, well developed, in no acute distress HEENT: normal Neck: no JVD, carotid bruits, or masses Cardiac: RRR; no murmurs, rubs, or gallops,no edema  Respiratory:  clear to auscultation bilaterally, normal work of breathing GI: soft, nontender, nondistended, + BS  No hepatomegaly  MS: no deformity Moving all extremities   Skin: warm and dry, no rash Neuro:  Strength and sensation are intact Psych: euthymic mood, full affect   EKG:  EKG is not ordered today.   Lipid Panel    Component Value Date/Time   CHOL 148 10/03/2014 0850   TRIG 181.0* 10/03/2014 0850   HDL 38.00* 10/03/2014 0850   CHOLHDL 4 10/03/2014 0850   VLDL 36.2 10/03/2014 0850   LDLCALC 74 10/03/2014 0850      Wt Readings from Last 3 Encounters:  03/28/15 300 lb (136.079 kg)  10/07/14 289 lb (131.09 kg)  07/02/14 289 lb (131.09 kg)      ASSESSMENT AND PLAN:  1.  CAD Finish effient in November  No symptoms of angina    2.  HL  Mild elevation in CK  He has had in past.  3.  CK  Chronic problem  4.  HTn  BP is controlled    /U in 6 months        Signed, Dorris Carnes, MD  03/28/2015 9:43 AM    [address], [place], [place]  [address] Phone: [phone]; Fax: [phone]

## 2015-03-28 NOTE — Patient Instructions (Signed)
Your physician recommends that you continue on your current medications as directed. Please refer to the Current Medication list given to you today.  STOP Effient AFTER November.  Your physician wants you to follow-up in: [place].  You will receive a reminder letter in the mail two months in advance. If you don't receive a letter, please call our office to schedule the follow-up appointment.

## 2015-06-06 ENCOUNTER — Other Ambulatory Visit: Payer: Self-pay | Admitting: Physician Assistant

## 2015-06-17 ENCOUNTER — Encounter: Payer: Self-pay | Admitting: Internal Medicine

## 2015-09-05 ENCOUNTER — Other Ambulatory Visit: Payer: Self-pay | Admitting: Internal Medicine

## 2015-11-13 ENCOUNTER — Encounter: Payer: Self-pay | Admitting: Internal Medicine

## 2015-11-13 ENCOUNTER — Ambulatory Visit (INDEPENDENT_AMBULATORY_CARE_PROVIDER_SITE_OTHER): Payer: Medicare Other | Admitting: Internal Medicine

## 2015-11-13 VITALS — BP 126/68 | HR 63 | Ht 70.5 in | Wt 292.0 lb

## 2015-11-13 DIAGNOSIS — I10 Essential (primary) hypertension: Secondary | ICD-10-CM

## 2015-11-13 DIAGNOSIS — E785 Hyperlipidemia, unspecified: Secondary | ICD-10-CM

## 2015-11-13 LAB — BASIC METABOLIC PANEL
BUN: 27 mg/dL — ABNORMAL HIGH (ref 7–25)
CHLORIDE: 101 mmol/L (ref 98–110)
CO2: 30 mmol/L (ref 20–31)
Calcium: 9 mg/dL (ref 8.6–10.3)
Creat: 1.26 mg/dL — ABNORMAL HIGH (ref 0.70–1.25)
Glucose, Bld: 77 mg/dL (ref 65–99)
POTASSIUM: 4.5 mmol/L (ref 3.5–5.3)
SODIUM: 139 mmol/L (ref 135–146)

## 2015-11-13 LAB — CBC
HCT: 42 % (ref 38.5–50.0)
Hemoglobin: 14.3 g/dL (ref 13.2–17.1)
MCH: 33 pg (ref 27.0–33.0)
MCHC: 34 g/dL (ref 32.0–36.0)
MCV: 97 fL (ref 80.0–100.0)
MPV: 9.5 fL (ref 7.5–12.5)
PLATELETS: 125 10*3/uL — AB (ref 140–400)
RBC: 4.33 MIL/uL (ref 4.20–5.80)
RDW: 13.5 % (ref 11.0–15.0)
WBC: 5.5 10*3/uL (ref 3.8–10.8)

## 2015-11-13 LAB — LIPID PANEL
CHOL/HDL RATIO: 3.3 ratio (ref <=5.0)
Cholesterol: 130 mg/dL (ref 125–200)
HDL: 40 mg/dL (ref >=40)
LDL CALC: 64 mg/dL (ref <130)
Triglycerides: 130 mg/dL (ref <150)
VLDL: 26 mg/dL (ref <30)

## 2015-11-13 NOTE — Patient Instructions (Signed)
Your physician recommends that you continue on your current medications as directed. Please refer to the Current Medication list given to you today.  Your physician recommends that you return for lab work in: today (BMET, CBC, [place])  Your physician wants you to follow-up in: December, 2017 WITH DR. Harrington Challenger.  You will receive a reminder letter in the mail two months in advance. If you don't receive a letter, please call our office to schedule the follow-up appointment.

## 2015-11-13 NOTE — Progress Notes (Signed)
Cardiology Office Note   Date:  11/13/2015   ID:  Cheng Fugitt, DOB 03-14-1951, MRN [id number]  PCP:  No PCP Per Patient  Cardiologist:   Dorris Carnes, MD   Pt presents for f/u of CAD     History of Present Illness: [name] is a 65 y.o. male with a history of He underwent cardiac catheterization in 2015 that showed: with a widely patent previously placed 4.018 mm bare-metal stent in the proximal LAD without restenosis in an otherwise normal LAD and circumflex system. Dominant RCA with 20-30% proximal areas of narrowing with luminal irregularity, 20-30% eccentric narrowing segment followed by 99% stenosis proximal to the acute margin.Normal LV function without focal wall motion abnormalities and an ejection fraction of 60%. The patinet underwent Successful PCI to the subtotal occlusion of the RCA with 99% stenosis being reduced to 0% with ultimate insertion of a Xience Alpine 4.018 mm DES stent post dilated to 4.49 mm.  He denies CP  Has lost wt  Taking leptiburn   Breathing is better with wt down      Outpatient Prescriptions Prior to Visit  Medication Sig Dispense Refill  . aspirin EC 81 MG EC tablet Take 1 tablet (81 mg total) by mouth daily.    Marland Kitchen atenolol (TENORMIN) 25 MG tablet Take 1 tablet (25 mg total) by mouth 2 (two) times daily. 180 tablet 3  . atorvastatin (LIPITOR) 10 MG tablet TAKE ONE TABLET BY MOUTH ONCE DAILY 90 tablet 2  . b complex vitamins tablet Take 1 tablet by mouth daily.    . Coenzyme Q10 (CO Q 10 PO) Take 1 tablet by mouth daily.     . DiphenhydrAMINE HCl (BENADRYL ALLERGY PO) Take 2 tablets by mouth at bedtime as needed (for sleep).     . FOLIC ACID PO Take 1 tablet by mouth daily.    Marland Kitchen GLUCOSAMINE CHONDROITIN COMPLX PO Take 1 tablet by mouth 2 (two) times daily.    Marland Kitchen KRILL OIL PO Take 1 tablet by mouth daily. 1 tab daily    . lisinopril (PRINIVIL,ZESTRIL) 10 MG tablet Take 1 tablet (10 mg total) by mouth daily. 90 tablet 3  . MELATONIN PO Take by  mouth as directed.    . nitroGLYCERIN (NITROSTAT) 0.4 MG SL tablet Place 1 tablet (0.4 mg total) under the tongue every 5 (five) minutes as needed for chest pain. 90 tablet 3  . saw palmetto 160 MG capsule Take 160 mg by mouth daily.    . ciprofloxacin (CIPRO) 500 MG tablet Take 1 tablet (500 mg total) by mouth 2 (two) times daily. 7 tablet 0   No facility-administered medications prior to visit.     Allergies:   Review of patient's allergies indicates no known allergies.   Past Medical History  Diagnosis Date  . Coronary artery disease     a. s/p BMS to LAD (1998) b. s/p DES to RCA (06/24/14)  . Spinal stenosis     Dr Vaughan Sine  surgery  . Myositis     chronic CK elevation ( 415 CK, ALL MM CK subtype, seen by Rheumatology and considered normal Variant)  . Hyperlipidemia   . Palpable abd. aorta   . Palatal fistula   . Dyspnea   . RBBB   . Weakness   . HTN (hypertension)     Past Surgical History  Procedure Laterality Date  . Cervical fusion  09/2009  . Coronary angioplasty    . Lumbar spine surgery  5/13  . Coronary stent placement  06/24/2014    DES to RCA  . Left heart catheterization with coronary angiogram N/A 06/24/2014    Procedure: LEFT HEART CATHETERIZATION WITH CORONARY ANGIOGRAM;  Surgeon: Troy Sine, MD;  Location: [facility] CATH LAB;  Service: Cardiovascular;  Laterality: N/A;  . Percutaneous coronary stent intervention (pci-s)  06/24/2014    Procedure: PERCUTANEOUS CORONARY STENT INTERVENTION (PCI-S);  Surgeon: Troy Sine, MD;  Location: [facility] CATH LAB;  Service: Cardiovascular;;  Distal RCA     Social History:  The patient  reports that he quit smoking about 26 years ago. He has never used smokeless tobacco. He reports that he drinks alcohol. He reports that he does not use illicit drugs.   Family History:  The patient's family history includes Aneurysm in his father; Cancer in his mother; Cystic fibrosis in his brother; Heart attack in his mother; Heart  disease in his father. There is no history of Stroke.    ROS:  Please see the history of present illness. All other systems are reviewed and  Negative to the above problem except as noted.    PHYSICAL EXAM: VS:  BP 126/68 mmHg  Pulse 63  Ht 5' 10.5" (1.791 m)  Wt 292 lb (132.45 kg)  BMI 41.29 kg/m2  GEN: Well nourished, well developed, in no acute distress HEENT: normal Neck: no JVD, carotid bruits, or masses Cardiac: RRR; no murmurs, rubs, or gallops,no edema  Respiratory:  clear to auscultation bilaterally, normal work of breathing GI: soft, nontender, nondistended, + BS  No hepatomegaly  MS: no deformity Moving all extremities   Skin: warm and dry, no rash Neuro:  Strength and sensation are intact Psych: euthymic mood, full affect   EKG:  EKG is ordered today.  SR 63 bpm  First degree AV block  PR 220 msec  RBBB     Lipid Panel    Component Value Date/Time   CHOL 148 10/03/2014 0850   TRIG 181.0* 10/03/2014 0850   HDL 38.00* 10/03/2014 0850   CHOLHDL 4 10/03/2014 0850   VLDL 36.2 10/03/2014 0850   LDLCALC 74 10/03/2014 0850      Wt Readings from Last 3 Encounters:  11/13/15 292 lb (132.45 kg)  03/28/15 300 lb (136.079 kg)  10/07/14 289 lb (131.09 kg)      ASSESSMENT AND PLAN:  1  CAD No sympotms to sugg angina.    2.  HL  Check lipids today   3.  HTN  Adequate control Check labs   4  Wt loss Congratulated on loss  I think leptoburn is OK    F?U in December       Signed, Paula Ross, MD  11/13/2015 10:44 AM    [facility] [place], [place], [place]  [address] Phone: [phone]; Fax: [phone]

## 2015-11-17 NOTE — Addendum Note (Signed)
Addended by: Freada Bergeron on: 11/17/2015 12:06 PM   Modules accepted: Orders

## 2015-12-02 ENCOUNTER — Other Ambulatory Visit: Payer: Self-pay | Admitting: *Deleted

## 2015-12-02 DIAGNOSIS — I10 Essential (primary) hypertension: Secondary | ICD-10-CM

## 2015-12-02 DIAGNOSIS — G729 Myopathy, unspecified: Secondary | ICD-10-CM

## 2015-12-02 DIAGNOSIS — E785 Hyperlipidemia, unspecified: Secondary | ICD-10-CM

## 2015-12-02 MED ORDER — ATENOLOL 25 MG PO TABS: 25.0000 mg | ORAL_TABLET | Freq: Two times a day (BID) | ORAL | Status: DC

## 2015-12-24 ENCOUNTER — Other Ambulatory Visit: Payer: Self-pay | Admitting: *Deleted

## 2015-12-24 DIAGNOSIS — I10 Essential (primary) hypertension: Secondary | ICD-10-CM

## 2015-12-24 DIAGNOSIS — E785 Hyperlipidemia, unspecified: Secondary | ICD-10-CM

## 2015-12-24 DIAGNOSIS — G729 Myopathy, unspecified: Secondary | ICD-10-CM

## 2015-12-24 MED ORDER — LISINOPRIL 10 MG PO TABS
10.0000 mg | ORAL_TABLET | Freq: Every day | ORAL | Status: DC
Start: 2015-12-24 — End: 2016-12-02

## 2016-03-01 ENCOUNTER — Other Ambulatory Visit: Payer: Self-pay | Admitting: *Deleted

## 2016-03-01 DIAGNOSIS — E785 Hyperlipidemia, unspecified: Secondary | ICD-10-CM

## 2016-03-01 DIAGNOSIS — G729 Myopathy, unspecified: Secondary | ICD-10-CM

## 2016-03-01 DIAGNOSIS — I10 Essential (primary) hypertension: Secondary | ICD-10-CM

## 2016-03-01 MED ORDER — ATENOLOL 25 MG PO TABS: 25.0000 mg | ORAL_TABLET | Freq: Two times a day (BID) | ORAL | 3 refills | Status: DC

## 2016-03-03 IMAGING — CR DG CHEST 2V
3 series · 3 of 3 positions shown · non-contrast
Comparison: None.

CLINICAL DATA: Abnormal chest x-ray

EXAM:
CHEST  2 VIEW

[w chest pa (1 of 2)]
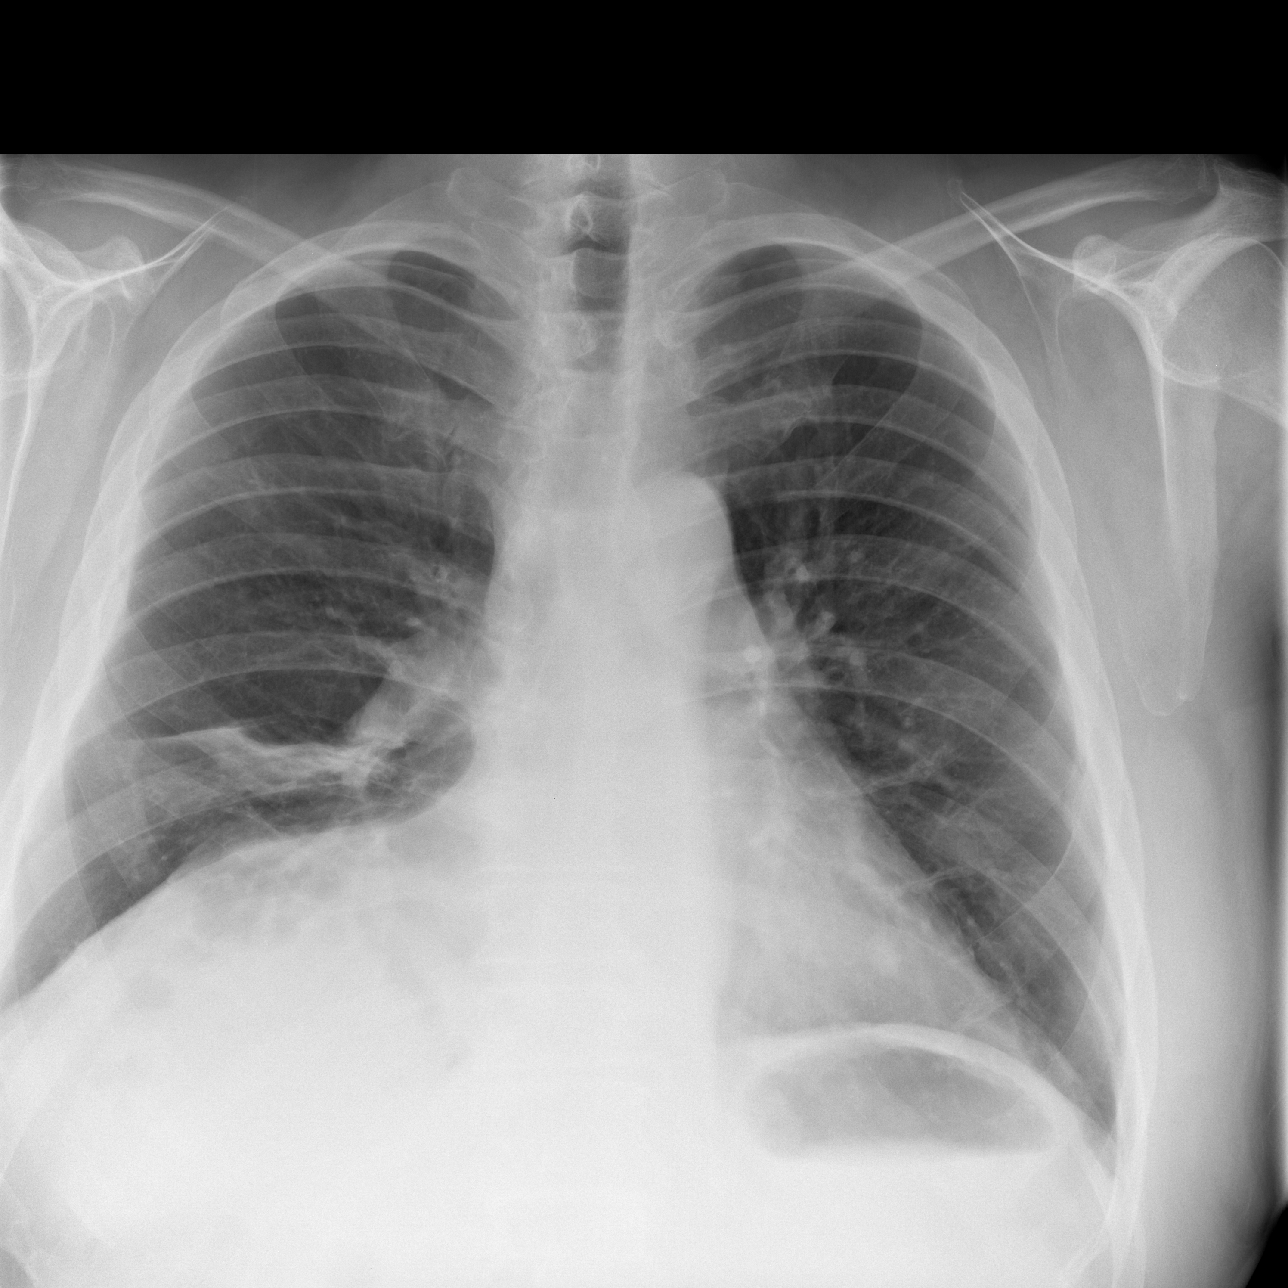

[w chest pa (2 of 2)]
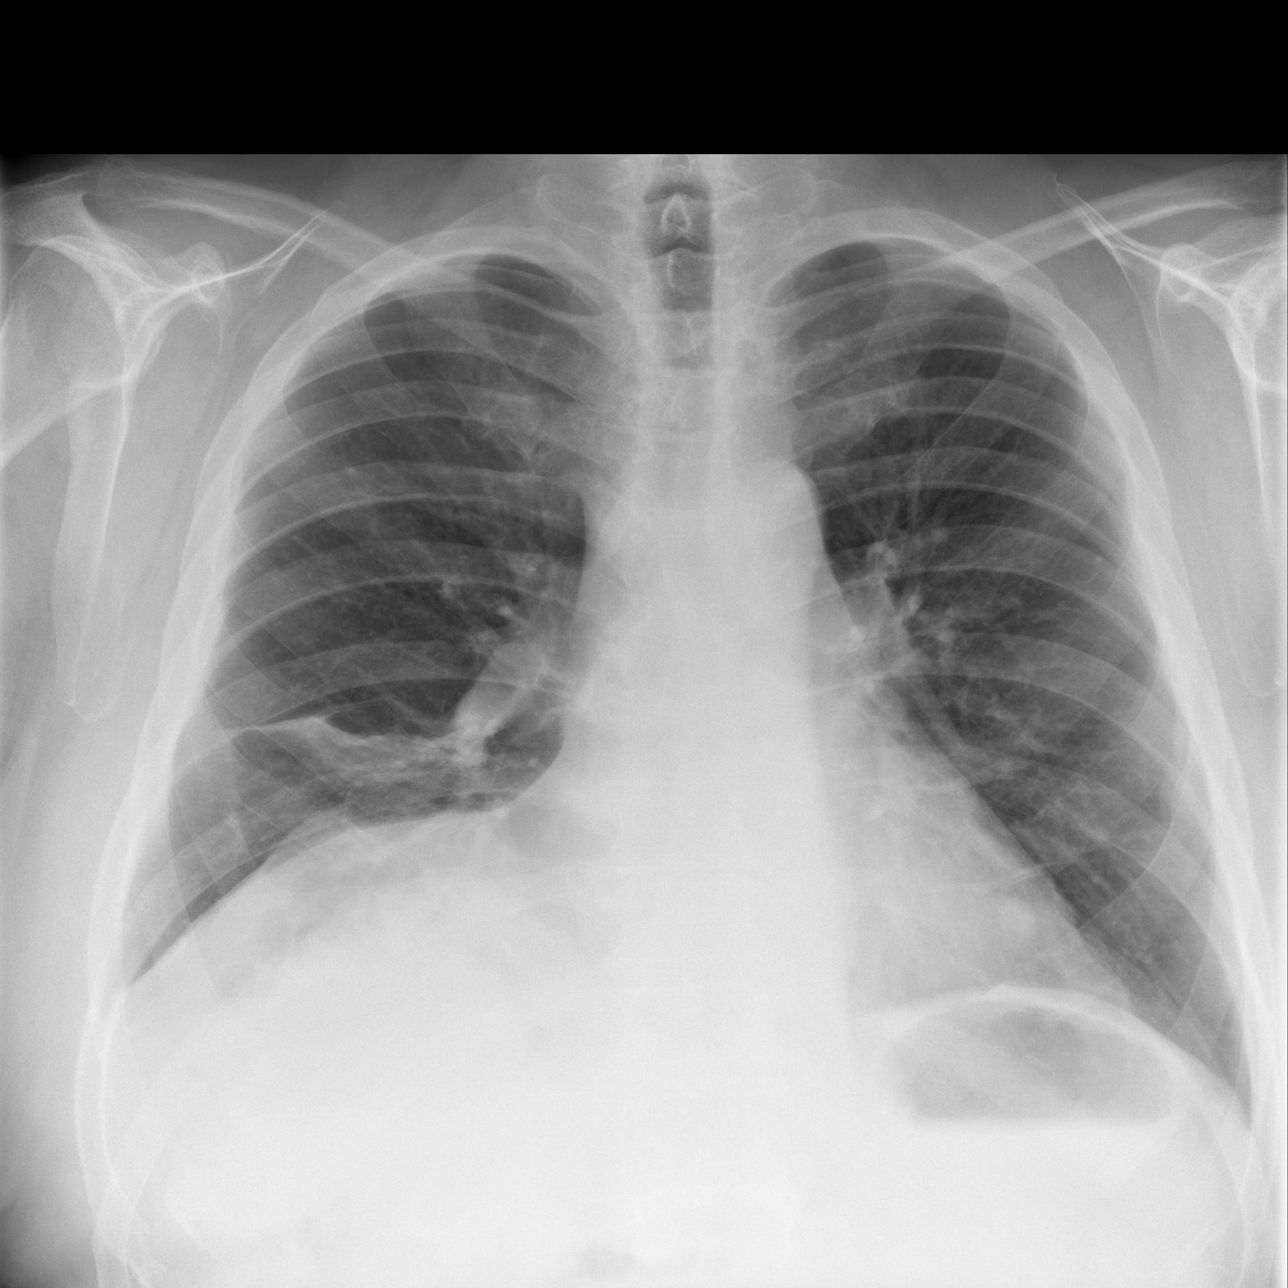

[w chest lat]
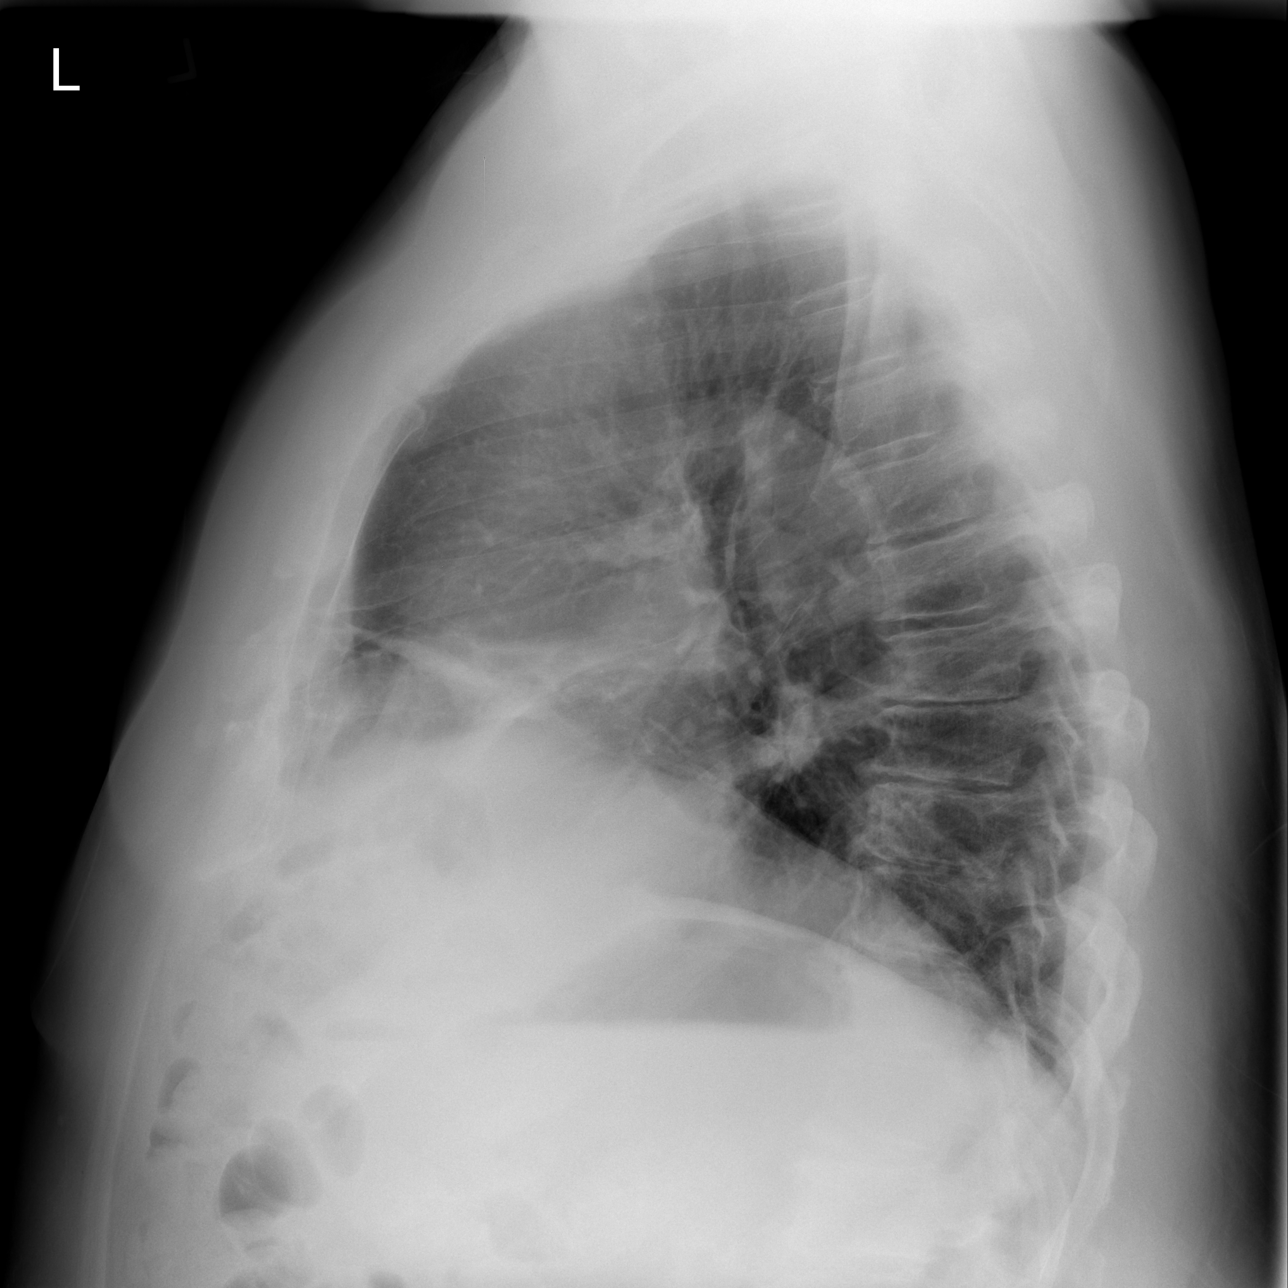

[3 of 3 positions shown; findings below may reference images not displayed]

FINDINGS: Cardiomediastinal silhouette is unremarkable. There is elevation of
the right hemidiaphragm. Streaky airspace disease right base
suspicious for evolving pneumonia. Follow-up to resolution
recommended. Degenerative changes thoracic spine.
IMPRESSION: There is elevation of the right hemidiaphragm. Streaky airspace
disease right base suspicious for evolving pneumonia. Follow-up to
resolution recommended.

## 2016-03-08 DIAGNOSIS — L918 Other hypertrophic disorders of the skin: Secondary | ICD-10-CM | POA: Diagnosis not present

## 2016-03-08 DIAGNOSIS — D485 Neoplasm of uncertain behavior of skin: Secondary | ICD-10-CM | POA: Diagnosis not present

## 2016-03-08 DIAGNOSIS — L814 Other melanin hyperpigmentation: Secondary | ICD-10-CM | POA: Diagnosis not present

## 2016-03-08 DIAGNOSIS — D0462 Carcinoma in situ of skin of left upper limb, including shoulder: Secondary | ICD-10-CM | POA: Diagnosis not present

## 2016-03-08 DIAGNOSIS — L821 Other seborrheic keratosis: Secondary | ICD-10-CM | POA: Diagnosis not present

## 2016-03-08 DIAGNOSIS — D3617 Benign neoplasm of peripheral nerves and autonomic nervous system of trunk, unspecified: Secondary | ICD-10-CM | POA: Diagnosis not present

## 2016-03-08 DIAGNOSIS — L309 Dermatitis, unspecified: Secondary | ICD-10-CM | POA: Diagnosis not present

## 2016-03-29 ENCOUNTER — Other Ambulatory Visit: Payer: Self-pay | Admitting: Internal Medicine

## 2016-04-13 ENCOUNTER — Encounter: Payer: Self-pay | Admitting: Internal Medicine

## 2016-04-26 ENCOUNTER — Ambulatory Visit (INDEPENDENT_AMBULATORY_CARE_PROVIDER_SITE_OTHER): Payer: Medicare Other | Admitting: Internal Medicine

## 2016-04-26 ENCOUNTER — Other Ambulatory Visit: Payer: Self-pay | Admitting: Internal Medicine

## 2016-04-26 ENCOUNTER — Encounter: Payer: Self-pay | Admitting: Internal Medicine

## 2016-04-26 VITALS — BP 138/82 | HR 59 | Ht 70.5 in | Wt 283.2 lb

## 2016-04-26 DIAGNOSIS — Z23 Encounter for immunization: Secondary | ICD-10-CM | POA: Diagnosis not present

## 2016-04-26 DIAGNOSIS — W57XXXA Bitten or stung by nonvenomous insect and other nonvenomous arthropods, initial encounter: Secondary | ICD-10-CM | POA: Diagnosis not present

## 2016-04-26 DIAGNOSIS — I200 Unstable angina: Secondary | ICD-10-CM | POA: Diagnosis not present

## 2016-04-26 DIAGNOSIS — I10 Essential (primary) hypertension: Secondary | ICD-10-CM | POA: Diagnosis not present

## 2016-04-26 LAB — CBC
HEMATOCRIT: 39.7 % (ref 38.5–50.0)
HEMOGLOBIN: 13.5 g/dL (ref 13.2–17.1)
MCH: 33.3 pg — AB (ref 27.0–33.0)
MCHC: 34 g/dL (ref 32.0–36.0)
MCV: 98 fL (ref 80.0–100.0)
MPV: 8.9 fL (ref 7.5–12.5)
Platelets: 109 10*3/uL — ABNORMAL LOW (ref 140–400)
RBC: 4.05 MIL/uL — AB (ref 4.20–5.80)
RDW: 13.3 % (ref 11.0–15.0)
WBC: 4.3 10*3/uL (ref 3.8–10.8)

## 2016-04-26 LAB — BASIC METABOLIC PANEL
BUN: 24 mg/dL (ref 7–25)
CALCIUM: 9.2 mg/dL (ref 8.6–10.3)
CHLORIDE: 104 mmol/L (ref 98–110)
CO2: 28 mmol/L (ref 20–31)
CREATININE: 0.96 mg/dL (ref 0.70–1.25)
GLUCOSE: 83 mg/dL (ref 65–99)
Potassium: 4.6 mmol/L (ref 3.5–5.3)
Sodium: 141 mmol/L (ref 135–146)

## 2016-04-26 NOTE — Patient Instructions (Signed)
Your physician recommends that you continue on your current medications as directed. Please refer to the Current Medication list given to you today. Your physician recommends that you return for lab work in:  (BMET, CBC, LYME TITRE) Your physician recommends that you schedule a follow-up appointment in: May, 2018 with Dr. Harrington Challenger.

## 2016-04-26 NOTE — Progress Notes (Signed)
Cardiology Office Note   Date:  04/26/2016   ID:  [name], DOB 1950/10/16, MRN [id number]  PCP:  No PCP Per Patient  Cardiologist:   Dorris Carnes, MD   Pt presents for f/u of CAD     History of Present Illness: [name] is a 65 y.o. male with a history ofcoronary obstructive disease  Last cardiac catheterization sine that This showed: with a widely patent previously placed 4.018 mm bare-metal stent in the proximal LAD without restenosis in an otherwise normal LAD and circumflex system. Dominant RCA with 20-30% proximal areas of narrowing with luminal irregularity, 20-30% eccentric narrowing segment followed by 99% stenosis proximal to the acute margin.Normal LV function without focal wall motion abnormalities and an ejection fraction of 60%. The patinet underwent Successful PCI to the subtotal occlusion of the RCA with 99% stenosis being reduced to 0% with ultimate insertion of a Xience Alpine 4.018 mm DES stent post dilated to 4.49 mm. Going to [place] in 2 wks for winter.     Pt last in clinic in April 2017  Since seen breathing is stable Activity level unchanged   No chest pains   Complains of pain in hands with some loss of function     Tick bites in past   ? If chronic lyme dz      Outpatient Medications Prior to Visit  Medication Sig Dispense Refill  . aspirin EC 81 MG EC tablet Take 1 tablet (81 mg total) by mouth daily.    Marland Kitchen atenolol (TENORMIN) 25 MG tablet Take 1 tablet (25 mg total) by mouth 2 (two) times daily. 180 tablet 3  . atorvastatin (LIPITOR) 10 MG tablet TAKE ONE TABLET BY MOUTH ONCE DAILY 90 tablet 1  . b complex vitamins tablet Take 1 tablet by mouth daily.    . Coenzyme Q10 (CO Q 10 PO) Take 1 tablet by mouth daily.     . DiphenhydrAMINE HCl (BENADRYL ALLERGY PO) Take 2 tablets by mouth at bedtime as needed (for sleep).     . FOLIC ACID PO Take 1 tablet by mouth daily.    Marland Kitchen KRILL OIL PO Take 1 tablet by mouth daily. 1 tab daily    . lisinopril  (PRINIVIL,ZESTRIL) 10 MG tablet Take 1 tablet (10 mg total) by mouth daily. 90 tablet 3  . MELATONIN PO Take by mouth as directed.    . nitroGLYCERIN (NITROSTAT) 0.4 MG SL tablet Place 1 tablet (0.4 mg total) under the tongue every 5 (five) minutes as needed for chest pain. 90 tablet 3  . saw palmetto 160 MG capsule Take 160 mg by mouth daily.    Marland Kitchen GLUCOSAMINE CHONDROITIN COMPLX PO Take 1 tablet by mouth 2 (two) times daily.     No facility-administered medications prior to visit.      Allergies:   Review of patient's allergies indicates no known allergies.   Past Medical History:  Diagnosis Date  . Coronary artery disease    a. s/p BMS to LAD (1998) b. s/p DES to RCA (06/24/14)  . Dyspnea   . HTN (hypertension)   . Hyperlipidemia   . Myositis    chronic CK elevation ( 415 CK, ALL MM CK subtype, seen by Rheumatology and considered normal Variant)  . Palatal fistula   . Palpable abd. aorta   . RBBB   . Spinal stenosis    Dr Vaughan Sine  surgery  . Weakness     Past Surgical History:  Procedure Laterality  Date  . CERVICAL FUSION  09/2009  . CORONARY ANGIOPLASTY    . CORONARY STENT PLACEMENT  06/24/2014   DES to RCA  . LEFT HEART CATHETERIZATION WITH CORONARY ANGIOGRAM N/A 06/24/2014   Procedure: LEFT HEART CATHETERIZATION WITH CORONARY ANGIOGRAM;  Surgeon: Troy Sine, MD;  Location: [facility] CATH LAB;  Service: Cardiovascular;  Laterality: N/A;  . LUMBAR SPINE SURGERY  5/13  . PERCUTANEOUS CORONARY STENT INTERVENTION (PCI-S)  06/24/2014   Procedure: PERCUTANEOUS CORONARY STENT INTERVENTION (PCI-S);  Surgeon: Troy Sine, MD;  Location: [facility] CATH LAB;  Service: Cardiovascular;;  Distal RCA     Social History:  The patient  reports that he quit smoking about 26 years ago. He has never used smokeless tobacco. He reports that he drinks alcohol. He reports that he does not use drugs.   Family History:  The patient's family history includes Aneurysm in his father; Cancer in his  mother; Cystic fibrosis in his brother; Heart attack in his mother; Heart disease in his father.    ROS:  Please see the history of present illness. All other systems are reviewed and  Negative to the above problem except as noted.    PHYSICAL EXAM: VS:  BP 138/82   Pulse (!) 59   Ht 5' 10.5" (1.791 m)   Wt 283 lb 3.2 oz (128.5 kg)   SpO2 95%   BMI 40.06 kg/m   GEN: Well nourished, well developed, in no acute distress  HEENT: normal  Neck: no JVD, carotid bruits, or masses Cardiac: RRR; no murmurs, rubs, or gallops,Tr edema  Respiratory:  clear to auscultation bilaterally, normal work of breathing GI: soft, nontender, nondistended, + BS  No hepatomegaly  MS: no deformity Moving all extremities   Skin: warm and dry, no rash Neuro:  Strength and sensation are intact Psych: euthymic mood, full affect   EKG:  EKG is not ordered today.   Lipid Panel    Component Value Date/Time   CHOL 130 11/13/2015 1107   TRIG 130 11/13/2015 1107   HDL 40 11/13/2015 1107   CHOLHDL 3.3 11/13/2015 1107   VLDL 26 11/13/2015 1107   LDLCALC 64 11/13/2015 1107      Wt Readings from Last 3 Encounters:  04/26/16 283 lb 3.2 oz (128.5 kg)  11/13/15 292 lb (132.5 kg)  03/28/15 300 lb (136.1 kg)      ASSESSMENT AND PLAN:  1  CAD  I am not convinced of active angina  Keep on same regimen  Check labs  2.  HL  Keep on statin  3.  ? Lyme dz chronic   Pt very worried  Had many bites in past   Will check lyme tighter     Current medicines are reviewed at length with the patient today.  The patient does not have concerns regarding medicines.  Signed, Dorris Carnes, MD  04/26/2016 10:57 AM    [address], [place], [place]  [address] Phone: [phone]; Fax: [phone]

## 2016-04-28 LAB — LYME DISEASE ABS IGG, IGM, IFA, CSF

## 2016-04-30 ENCOUNTER — Other Ambulatory Visit: Payer: Self-pay | Admitting: *Deleted

## 2016-04-30 ENCOUNTER — Telehealth: Payer: Self-pay | Admitting: Internal Medicine

## 2016-04-30 DIAGNOSIS — W57XXXA Bitten or stung by nonvenomous insect and other nonvenomous arthropods, initial encounter: Secondary | ICD-10-CM

## 2016-04-30 NOTE — Telephone Encounter (Signed)
Left a message for Erin from [facility] to call back.

## 2016-04-30 NOTE — Addendum Note (Signed)
Addended by: Rodman Key on: 04/30/2016 12:48 PM   Modules accepted: Orders

## 2016-04-30 NOTE — Telephone Encounter (Signed)
Era from [facility] was just informing us she was unable to complete the lab order due to the fact she wasn't the office that drew the first specimen on 04/26/16. Thanks

## 2016-05-03 LAB — LYME AB/WESTERN BLOT REFLEX: B burgdorferi Ab IgG+IgM: <0.9 Index (ref <0.90)

## 2016-05-05 ENCOUNTER — Other Ambulatory Visit: Payer: Self-pay | Admitting: *Deleted

## 2016-09-28 ENCOUNTER — Other Ambulatory Visit: Payer: Self-pay | Admitting: Internal Medicine

## 2016-12-02 ENCOUNTER — Other Ambulatory Visit: Payer: Self-pay | Admitting: *Deleted

## 2016-12-02 ENCOUNTER — Other Ambulatory Visit: Payer: Self-pay | Admitting: Internal Medicine

## 2016-12-02 DIAGNOSIS — G729 Myopathy, unspecified: Secondary | ICD-10-CM

## 2016-12-02 DIAGNOSIS — I10 Essential (primary) hypertension: Secondary | ICD-10-CM

## 2016-12-02 DIAGNOSIS — E785 Hyperlipidemia, unspecified: Secondary | ICD-10-CM

## 2016-12-02 MED ORDER — LISINOPRIL 10 MG PO TABS: 10.0000 mg | ORAL_TABLET | Freq: Every day | ORAL | 1 refills | Status: DC

## 2016-12-26 NOTE — Progress Notes (Signed)
Cardiology Office Note   Date:  12/27/2016   ID:  [name], DOB 08/07/1950, MRN [id number]  PCP:  Patient, No Pcp Per  Cardiologist:   Dorris Carnes, MD    F?U of CAD     History of Present Illness: [name] is a 66 y.o. male with a history of ofcoronary obstructive disease  Last cardiac catheterization in Novembere 2015 sine that This showed: with a widely patent previously placed 4.018 mm bare-metal stent in the proximal LAD without restenosis in an otherwise normal LAD and circumflex system. Dominant RCA with 20-30% proximal areas of narrowing with luminal irregularity, 20-30% eccentric narrowing segment followed by 99% stenosis proximal to the acute margin.Normal LV function without focal wall motion abnormalities and an ejection fraction of 60%. The patinet underwent Successful PCI to the subtotal occlusion of the RCA with 99% stenosis being reduced to 0% with ultimate insertion of a Xience Alpine 4.018 mm DES stent post dilated to 4.49 mm.  I saw the pt in clinic in October 2017  Since seen sometimes has PND  And SOB at rest  Intermitt   Takes lasix prn for full feeling     Current Meds  Medication Sig  . aspirin EC 81 MG EC tablet Take 1 tablet (81 mg total) by mouth daily.  Marland Kitchen atenolol (TENORMIN) 25 MG tablet Take 1 tablet (25 mg total) by mouth 2 (two) times daily. Please keep 12/27/16 appt for further refills  . atorvastatin (LIPITOR) 10 MG tablet Take 1 tablet (10 mg total) by mouth daily. Please keep 12/27/16 appt for further refills  . b complex vitamins tablet Take 1 tablet by mouth daily.  . Coenzyme Q10 (CO Q 10 PO) Take 1 tablet by mouth daily.   . DiphenhydrAMINE HCl (BENADRYL ALLERGY PO) Take 2 tablets by mouth at bedtime as needed (for sleep).   . FOLIC ACID PO Take 1 tablet by mouth daily.  . furosemide (LASIX) 40 MG tablet Take 40 mg by mouth as needed.  Marland Kitchen ibuprofen (ADVIL,MOTRIN) 800 MG tablet Take 800 mg by mouth as directed.  Marland Kitchen KRILL OIL PO Take 1  tablet by mouth daily. 1 tab daily  . lisinopril (PRINIVIL,ZESTRIL) 10 MG tablet Take 1 tablet (10 mg total) by mouth daily.  Marland Kitchen MELATONIN PO Take by mouth as directed.  . nitroGLYCERIN (NITROSTAT) 0.4 MG SL tablet Place 1 tablet (0.4 mg total) under the tongue every 5 (five) minutes as needed for chest pain.  Marland Kitchen OVER THE COUNTER MEDICATION Collegen type 2-- as directed  . saw palmetto 160 MG capsule Take 160 mg by mouth daily.  Marland Kitchen triamcinolone cream (KENALOG) 0.1 % Apply 1 application topically as directed.     Allergies:   Patient has no known allergies.   Past Medical History:  Diagnosis Date  . Coronary artery disease    a. s/p BMS to LAD (1998) b. s/p DES to RCA (06/24/14)  . Dyspnea   . HTN (hypertension)   . Hyperlipidemia   . Myositis    chronic CK elevation ( 415 CK, ALL MM CK subtype, seen by Rheumatology and considered normal Variant)  . Palatal fistula   . Palpable abd. aorta   . RBBB   . Spinal stenosis    Dr Vaughan Sine  surgery  . Weakness     Past Surgical History:  Procedure Laterality Date  . CERVICAL FUSION  09/2009  . CORONARY ANGIOPLASTY    . CORONARY STENT PLACEMENT  06/24/2014  DES to RCA  . LEFT HEART CATHETERIZATION WITH CORONARY ANGIOGRAM N/A 06/24/2014   Procedure: LEFT HEART CATHETERIZATION WITH CORONARY ANGIOGRAM;  Surgeon: Troy Sine, MD;  Location: [facility] CATH LAB;  Service: Cardiovascular;  Laterality: N/A;  . LUMBAR SPINE SURGERY  5/13  . PERCUTANEOUS CORONARY STENT INTERVENTION (PCI-S)  06/24/2014   Procedure: PERCUTANEOUS CORONARY STENT INTERVENTION (PCI-S);  Surgeon: Troy Sine, MD;  Location: [facility] CATH LAB;  Service: Cardiovascular;;  Distal RCA     Social History:  The patient  reports that he quit smoking about 27 years ago. He has never used smokeless tobacco. He reports that he drinks alcohol. He reports that he does not use drugs.   Family History:  The patient's family history includes Aneurysm in his father; Cancer in his mother;  Cystic fibrosis in his brother; Heart attack in his mother; Heart disease in his father.    ROS:  Please see the history of present illness. All other systems are reviewed and  Negative to the above problem except as noted.    PHYSICAL EXAM: VS:  BP 140/80   Pulse 61   Ht 5' 10.5" (1.791 m)   Wt 133.4 kg (294 lb)   BMI 41.59 kg/m   GEN: Morbidly obese 65 yo in NAD   HEENT: normal  Neck: no JVD, carotid bruits, or masses Cardiac: RRR; no murmurs, rubs,  Dinstant heart sounds   or gallops,no edema  Respiratory:  clear to auscultation bilaterally, normal work of breathing GI: soft, nontender, nondistended, + BS  No hepatomegaly  MS: no deformity Moving all extremities   Skin: warm and dry, no rash Neuro:  Strength and sensation are intact Psych: euthymic mood, full affect   EKG:  EKG is ordered today.  SR 61 bpm  RBBB  First degree AV block  PR 202     Lipid Panel    Component Value Date/Time   CHOL 130 11/13/2015 1107   TRIG 130 11/13/2015 1107   HDL 40 11/13/2015 1107   CHOLHDL 3.3 11/13/2015 1107   VLDL 26 11/13/2015 1107   LDLCALC 64 11/13/2015 1107      Wt Readings from Last 3 Encounters:  12/27/16 133.4 kg (294 lb)  04/26/16 128.5 kg (283 lb 3.2 oz)  11/13/15 132.5 kg (292 lb)      ASSESSMENT AND PLAN:  1  CAD  I am not convinced symptoms represent angin   May have increase  Fluid  Will check lpids and BMET today  Recomm taking prn  [place]    2  HL  Check lpids  3  HTN  BP OK    4  Leg issues    Given name of [name]  F/U in Feb 019    Current medicines are reviewed at length with the patient today.  The patient does not have concerns regarding medicines.  Signed, Dorris Carnes, MD  12/27/2016 10:11 AM    [facility] [place], [place], [place]  [address] Phone: [phone]; Fax: [phone]

## 2016-12-27 ENCOUNTER — Encounter: Payer: Self-pay | Admitting: Internal Medicine

## 2016-12-27 ENCOUNTER — Ambulatory Visit (INDEPENDENT_AMBULATORY_CARE_PROVIDER_SITE_OTHER): Payer: Medicare Other | Admitting: Internal Medicine

## 2016-12-27 VITALS — BP 140/80 | HR 61 | Ht 70.5 in | Wt 294.0 lb

## 2016-12-27 DIAGNOSIS — E785 Hyperlipidemia, unspecified: Secondary | ICD-10-CM | POA: Diagnosis not present

## 2016-12-27 DIAGNOSIS — E782 Mixed hyperlipidemia: Secondary | ICD-10-CM | POA: Diagnosis not present

## 2016-12-27 DIAGNOSIS — I251 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: Secondary | ICD-10-CM

## 2016-12-27 DIAGNOSIS — R0602 Shortness of breath: Secondary | ICD-10-CM | POA: Diagnosis not present

## 2016-12-27 DIAGNOSIS — I10 Essential (primary) hypertension: Secondary | ICD-10-CM

## 2016-12-27 DIAGNOSIS — G729 Myopathy, unspecified: Secondary | ICD-10-CM

## 2016-12-27 MED ORDER — FUROSEMIDE 40 MG PO TABS: 40.0000 mg | ORAL_TABLET | Freq: Every day | ORAL | 3 refills | Status: DC | PRN

## 2016-12-27 MED ORDER — ATORVASTATIN CALCIUM 10 MG PO TABS: 10.0000 mg | ORAL_TABLET | Freq: Every day | ORAL | 3 refills | Status: DC

## 2016-12-27 MED ORDER — LISINOPRIL 10 MG PO TABS: 10.0000 mg | ORAL_TABLET | Freq: Every day | ORAL | 3 refills | Status: DC

## 2016-12-27 MED ORDER — NITROGLYCERIN 0.4 MG SL SUBL: 0.4000 mg | SUBLINGUAL_TABLET | SUBLINGUAL | 3 refills | Status: DC | PRN

## 2016-12-27 MED ORDER — ATENOLOL 25 MG PO TABS: 25.0000 mg | ORAL_TABLET | Freq: Two times a day (BID) | ORAL | 3 refills | Status: DC

## 2016-12-27 NOTE — Patient Instructions (Signed)
Medication Instructions:  Your physician recommends that you continue on your current medications as directed. Please refer to the Current Medication list given to you today.   Labwork: TODAY - BNP, BMET, CBC, Lipid   Testing/Procedures: None Ordered   Follow-Up: Your physician wants you to follow-up in: February 2019 with Dr. Harrington Challenger.  You will receive a reminder letter in the mail two months in advance. If you don't receive a letter, please call our office to schedule the follow-up appointment.   If you need a refill on your cardiac medications before your next appointment, please call your pharmacy.   Thank you for choosing [facility]! Christen Bame, RN [phone]

## 2016-12-28 LAB — BASIC METABOLIC PANEL
BUN / CREAT RATIO: 20 (ref 10–24)
BUN: 19 mg/dL (ref 8–27)
CO2: 20 mmol/L (ref 18–29)
CREATININE: 0.97 mg/dL (ref 0.76–1.27)
Calcium: 9.2 mg/dL (ref 8.6–10.2)
Chloride: 103 mmol/L (ref 96–106)
GFR calc Af Amer: 92 mL/min/{1.73_m2} (ref >59)
GFR, EST NON AFRICAN AMERICAN: 80 mL/min/{1.73_m2} (ref >59)
GLUCOSE: 93 mg/dL (ref 65–99)
Potassium: 4.7 mmol/L (ref 3.5–5.2)
Sodium: 141 mmol/L (ref 134–144)

## 2016-12-28 LAB — CBC WITH DIFFERENTIAL/PLATELET
BASOS ABS: 0 10*3/uL (ref 0.0–0.2)
BASOS: 0 %
EOS (ABSOLUTE): 0.2 10*3/uL (ref 0.0–0.4)
Eos: 3 %
HEMOGLOBIN: 14.2 g/dL (ref 13.0–17.7)
Hematocrit: 42 % (ref 37.5–51.0)
IMMATURE GRANS (ABS): 0 10*3/uL (ref 0.0–0.1)
Immature Granulocytes: 0 %
LYMPHS ABS: 1 10*3/uL (ref 0.7–3.1)
LYMPHS: 19 %
MCH: 32.6 pg (ref 26.6–33.0)
MCHC: 33.8 g/dL (ref 31.5–35.7)
MCV: 96 fL (ref 79–97)
MONOS ABS: 0.6 10*3/uL (ref 0.1–0.9)
Monocytes: 11 %
NEUTROS ABS: 3.3 10*3/uL (ref 1.4–7.0)
Neutrophils: 67 %
Platelets: 117 10*3/uL — ABNORMAL LOW (ref 150–379)
RBC: 4.36 x10E6/uL (ref 4.14–5.80)
RDW: 14.2 % (ref 12.3–15.4)
WBC: 5 10*3/uL (ref 3.4–10.8)

## 2016-12-28 LAB — LIPID PANEL
CHOL/HDL RATIO: 3 ratio (ref 0.0–5.0)
CHOLESTEROL TOTAL: 142 mg/dL (ref 100–199)
HDL: 48 mg/dL (ref >39)
LDL CALC: 67 mg/dL (ref 0–99)
TRIGLYCERIDES: 137 mg/dL (ref 0–149)
VLDL CHOLESTEROL CAL: 27 mg/dL (ref 5–40)

## 2016-12-28 LAB — PRO B NATRIURETIC PEPTIDE: NT-PRO BNP: 310 pg/mL (ref 0–376)

## 2017-02-11 DIAGNOSIS — J069 Acute upper respiratory infection, unspecified: Secondary | ICD-10-CM | POA: Diagnosis not present

## 2017-03-10 DIAGNOSIS — L570 Actinic keratosis: Secondary | ICD-10-CM | POA: Diagnosis not present

## 2017-03-10 DIAGNOSIS — L300 Nummular dermatitis: Secondary | ICD-10-CM | POA: Diagnosis not present

## 2017-03-10 DIAGNOSIS — L821 Other seborrheic keratosis: Secondary | ICD-10-CM | POA: Diagnosis not present

## 2017-03-10 DIAGNOSIS — L814 Other melanin hyperpigmentation: Secondary | ICD-10-CM | POA: Diagnosis not present

## 2017-03-10 DIAGNOSIS — D1801 Hemangioma of skin and subcutaneous tissue: Secondary | ICD-10-CM | POA: Diagnosis not present

## 2017-04-30 ENCOUNTER — Other Ambulatory Visit: Payer: Self-pay | Admitting: Internal Medicine

## 2017-05-11 DIAGNOSIS — Z23 Encounter for immunization: Secondary | ICD-10-CM | POA: Diagnosis not present

## 2017-07-05 ENCOUNTER — Ambulatory Visit: Payer: Medicare Other | Admitting: Physician Assistant

## 2017-07-26 HISTORY — PX: COLONOSCOPY: SHX174

## 2017-11-22 ENCOUNTER — Encounter

## 2017-11-28 ENCOUNTER — Encounter: Payer: Self-pay | Admitting: Internal Medicine

## 2017-11-28 ENCOUNTER — Ambulatory Visit (INDEPENDENT_AMBULATORY_CARE_PROVIDER_SITE_OTHER): Payer: Medicare Other | Admitting: Internal Medicine

## 2017-11-28 ENCOUNTER — Encounter

## 2017-11-28 VITALS — BP 124/62 | HR 73 | Ht 71.0 in | Wt 296.0 lb

## 2017-11-28 DIAGNOSIS — E785 Hyperlipidemia, unspecified: Secondary | ICD-10-CM | POA: Diagnosis not present

## 2017-11-28 DIAGNOSIS — I251 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: Secondary | ICD-10-CM

## 2017-11-28 DIAGNOSIS — E782 Mixed hyperlipidemia: Secondary | ICD-10-CM | POA: Diagnosis not present

## 2017-11-28 DIAGNOSIS — G729 Myopathy, unspecified: Secondary | ICD-10-CM | POA: Diagnosis not present

## 2017-11-28 DIAGNOSIS — I10 Essential (primary) hypertension: Secondary | ICD-10-CM | POA: Diagnosis not present

## 2017-11-28 MED ORDER — LISINOPRIL 10 MG PO TABS: 10.0000 mg | ORAL_TABLET | Freq: Every day | ORAL | 3 refills | Status: DC

## 2017-11-28 MED ORDER — ATORVASTATIN CALCIUM 10 MG PO TABS: 10.0000 mg | ORAL_TABLET | Freq: Every day | ORAL | 3 refills | Status: DC

## 2017-11-28 MED ORDER — ATENOLOL 25 MG PO TABS: 25.0000 mg | ORAL_TABLET | Freq: Two times a day (BID) | ORAL | 3 refills | Status: DC

## 2017-11-28 MED ORDER — FUROSEMIDE 40 MG PO TABS: 40.0000 mg | ORAL_TABLET | Freq: Every day | ORAL | 3 refills | Status: DC | PRN

## 2017-11-28 NOTE — Patient Instructions (Signed)
Your physician recommends that you continue on your current medications as directed. Please refer to the Current Medication list given to you today.  Your physician recommends that you return for lab work today (BMET, CBC, LIPID, TSH)  Your physician wants you to follow-up in: December, 2019 WITH DR. Harrington Challenger.  You will receive a reminder letter in the mail two months in advance. If you don't receive a letter, please call our office to schedule the follow-up appointment.

## 2017-11-28 NOTE — Progress Notes (Signed)
Cardiology Office Note   Date:  11/28/2017   ID:  [name], DOB 1950-08-13, MRN [id number]  PCP:  Patient, No Pcp Per  Cardiologist:   Dorris Carnes, MD    F?U of CAD     History of Present Illness: [name] is a 67 y.o. male with a history of ofcoronary artery disease  Last cardiac catheterization in Novembere 2015 showed:  a widely patent previously placed 4.018 mm bare-metal stent in the proximal LAD without restenosis in an otherwise normal LAD and circumflex system. Dominant RCA with  99% stenosis proximal to the mid vessel .Normal LV function without focal wall motion abnormalities LVEF 60%. The patinet underwent PCI to the subtotal occlusion of the RCA with 99% stenosis being reduced to 0% with ultimate insertion of a Xience Alpine 4.018 mm DES stent post dilated to 4.49 mm.  I saw the pt in clinic in June 2018  Since he has been in clinic he denies CP   He says his breathing is OK   No dizziness   No palpitations    Activity limited by neuropathy    HAving particular problems with hands  Plans to go to [place] in the next few wks for a few wks      Current Meds  Medication Sig  . aspirin EC 81 MG EC tablet Take 1 tablet (81 mg total) by mouth daily.  Marland Kitchen atenolol (TENORMIN) 25 MG tablet Take 1 tablet (25 mg total) by mouth 2 (two) times daily.  Marland Kitchen atorvastatin (LIPITOR) 10 MG tablet Take 1 tablet (10 mg total) by mouth daily.  Marland Kitchen b complex vitamins tablet Take 1 tablet by mouth daily.  . Coenzyme Q10 (CO Q 10 PO) Take 1 tablet by mouth daily.   . DiphenhydrAMINE HCl (BENADRYL ALLERGY PO) Take 2 tablets by mouth at bedtime as needed (for sleep).   . FOLIC ACID PO Take 1 tablet by mouth daily.  . furosemide (LASIX) 40 MG tablet TAKE 1 TABLET BY MOUTH ONCE DAILY AS NEEDED  . ibuprofen (ADVIL,MOTRIN) 800 MG tablet Take 800 mg by mouth as directed.  Marland Kitchen KRILL OIL PO Take 1 tablet by mouth daily. 1 tab daily  . lisinopril (PRINIVIL,ZESTRIL) 10 MG tablet Take 1 tablet (10 mg  total) by mouth daily.  Marland Kitchen MELATONIN PO Take by mouth as directed.  . nitroGLYCERIN (NITROSTAT) 0.4 MG SL tablet Place 1 tablet (0.4 mg total) under the tongue every 5 (five) minutes as needed for chest pain.  Marland Kitchen OVER THE COUNTER MEDICATION Collegen type 2-- as directed  . saw palmetto 160 MG capsule Take 160 mg by mouth daily.  Marland Kitchen triamcinolone cream (KENALOG) 0.1 % Apply 1 application topically as directed.     Allergies:   Patient has no known allergies.   Past Medical History:  Diagnosis Date  . Coronary artery disease    a. s/p BMS to LAD (1998) b. s/p DES to RCA (06/24/14)  . Dyspnea   . HTN (hypertension)   . Hyperlipidemia   . Myositis    chronic CK elevation ( 415 CK, ALL MM CK subtype, seen by Rheumatology and considered normal Variant)  . Palatal fistula   . Palpable abd. aorta   . RBBB   . Spinal stenosis    Dr Vaughan Sine  surgery  . Weakness     Past Surgical History:  Procedure Laterality Date  . CERVICAL FUSION  09/2009  . CORONARY ANGIOPLASTY    . CORONARY STENT PLACEMENT  06/24/2014   DES to RCA  . LEFT HEART CATHETERIZATION WITH CORONARY ANGIOGRAM N/A 06/24/2014   Procedure: LEFT HEART CATHETERIZATION WITH CORONARY ANGIOGRAM;  Surgeon: Troy Sine, MD;  Location: [facility] CATH LAB;  Service: Cardiovascular;  Laterality: N/A;  . LUMBAR SPINE SURGERY  5/13  . PERCUTANEOUS CORONARY STENT INTERVENTION (PCI-S)  06/24/2014   Procedure: PERCUTANEOUS CORONARY STENT INTERVENTION (PCI-S);  Surgeon: Troy Sine, MD;  Location: [facility] CATH LAB;  Service: Cardiovascular;;  Distal RCA     Social History:  The patient  reports that he quit smoking about 28 years ago. He has never used smokeless tobacco. He reports that he drinks alcohol. He reports that he does not use drugs.   Family History:  The patient's family history includes Aneurysm in his father; Cancer in his mother; Cystic fibrosis in his brother; Heart attack in his mother; Heart disease in his father.    ROS:   Please see the history of present illness. All other systems are reviewed and  Negative to the above problem except as noted.    PHYSICAL EXAM: VS:  BP 124/62   Pulse 73   Ht 5\' 11"  (1.803 m)   Wt 296 lb (134.3 kg)   BMI 41.28 kg/m   GEN: Morbidly obese 67 yo in NAD   HEENT: normal  Neck: JVP is normal.  No carotid bruits, or masses Cardiac: RRR; no murmurs, rubs, ,Tr to 1+ edema  Respiratory:  clear to auscultation bilaterally, normal work of breathing UE:AVWU  Distended   BS  No hepatomegaly  MS: no deformity Moving all extremities   Skin: warm and dry, no rash  Erythema in legs  Scab over r knee Neuro:  Strength and sensation are intact Psych: euthymic mood, full affect   EKG:  EKG is ordered today.     SR 73  First degree AV block  PR 212 msec   RBBB.  LAFB   Lipid Panel    Component Value Date/Time   CHOL 142 12/27/2016 1029   TRIG 137 12/27/2016 1029   HDL 48 12/27/2016 1029   CHOLHDL 3.0 12/27/2016 1029   CHOLHDL 3.3 11/13/2015 1107   VLDL 26 11/13/2015 1107   LDLCALC 67 12/27/2016 1029      Wt Readings from Last 3 Encounters:  11/28/17 296 lb (134.3 kg)  12/27/16 294 lb (133.4 kg)  04/26/16 283 lb 3.2 oz (128.5 kg)      ASSESSMENT AND PLAN:  1  CAD  No symtpoms to sugg angina Continue to follw    2  HL WIll get liippds   3  HTN  BP OK  Check BMET    4 Morbid obesity   Pt says he was 10 lb when born  BY kindergarten he was 100 lb   Has always been big   Says he does not eat that much  Unfort activity is lmited  Counselled  5  Myopathy   Limits acitvity    F/U in Dec 2019     Current medicines are reviewed at length with the patient today.  The patient does not have concerns regarding medicines.  Signed, Dorris Carnes, MD  11/28/2017 3:16 PM    [facility] [place], [place], [place]  [address] Phone: [phone]; Fax: [phone]

## 2017-11-29 LAB — BASIC METABOLIC PANEL
BUN / CREAT RATIO: 25 — AB (ref 10–24)
BUN: 29 mg/dL — AB (ref 8–27)
CHLORIDE: 103 mmol/L (ref 96–106)
CO2: 22 mmol/L (ref 20–29)
Calcium: 9.1 mg/dL (ref 8.6–10.2)
Creatinine, Ser: 1.17 mg/dL (ref 0.76–1.27)
GFR calc non Af Amer: 63 mL/min/{1.73_m2} (ref >59)
GFR, EST AFRICAN AMERICAN: 73 mL/min/{1.73_m2} (ref >59)
Glucose: 87 mg/dL (ref 65–99)
POTASSIUM: 4.9 mmol/L (ref 3.5–5.2)
Sodium: 141 mmol/L (ref 134–144)

## 2017-11-29 LAB — LIPID PANEL
CHOLESTEROL TOTAL: 160 mg/dL (ref 100–199)
Chol/HDL Ratio: 3.5 ratio (ref 0.0–5.0)
HDL: 46 mg/dL (ref >39)
LDL Calculated: 49 mg/dL (ref 0–99)
Triglycerides: 325 mg/dL — ABNORMAL HIGH (ref 0–149)
VLDL Cholesterol Cal: 65 mg/dL — ABNORMAL HIGH (ref 5–40)

## 2017-11-29 LAB — CBC
Hematocrit: 40.4 % (ref 37.5–51.0)
Hemoglobin: 13.7 g/dL (ref 13.0–17.7)
MCH: 32.8 pg (ref 26.6–33.0)
MCHC: 33.9 g/dL (ref 31.5–35.7)
MCV: 97 fL (ref 79–97)
PLATELETS: 147 10*3/uL — AB (ref 150–379)
RBC: 4.18 x10E6/uL (ref 4.14–5.80)
RDW: 13.6 % (ref 12.3–15.4)
WBC: 6.5 10*3/uL (ref 3.4–10.8)

## 2017-11-29 LAB — TSH: TSH: 2.22 u[IU]/mL (ref 0.450–4.500)

## 2017-12-30 DIAGNOSIS — H9313 Tinnitus, bilateral: Secondary | ICD-10-CM | POA: Diagnosis not present

## 2017-12-30 DIAGNOSIS — H903 Sensorineural hearing loss, bilateral: Secondary | ICD-10-CM | POA: Diagnosis not present

## 2017-12-30 DIAGNOSIS — J342 Deviated nasal septum: Secondary | ICD-10-CM | POA: Diagnosis not present

## 2018-02-14 NOTE — Progress Notes (Signed)
Subjective:    Patient ID: [name], male    DOB: [birth year], 67 y.o.   MRN: [id number]  HPI:  [name] is here to establish as a new pt.  He is a pleasant 67 year old male. PMH:  HTN, RBBB, CAD, Angina, HLD, Spinal stenosis He has cardiac stent placed-1998, 2017, has been off anticoagulants for >18 months Failed cervical fusion 2011 Lumbar Fusions- 2012, 2013 He has sig extremity weakness and poor muscle tone-all worsening since 2011 He recently flew to [place] at end of June and since then has had R thigh pain/spasm He recently started Nutri-System and has lost >14 lbs, goal is to loss 40 lbs He estimates to drink 40-50 oz water/day He enjoys a 1-2 beers 3-4 times/week He denies tobacco use He has not seen Neurology since 2013- in [place]  He is followed by Cards/DR. Ross- last OV 12/2017  Patient Care Team    Relationship Specialty Notifications Start End  Mina Marble D, NP PCP - General Family Medicine  02/15/18 02/15/18  [name], MD Consulting Physician Cardiology  02/15/18   Harriett Sine, MD Consulting Physician Dermatology  02/15/18     Patient Active Problem List   Diagnosis Date Noted  . Healthcare maintenance 02/15/2018  . Stented coronary artery   . Myositis   . Hyperlipidemia   . Coronary artery disease   . Spinal stenosis   . Weakness   . HTN (hypertension)   . Unstable angina ([place]) 06/24/2014     Past Medical History:  Diagnosis Date  . Coronary artery disease    a. s/p BMS to LAD (1998) b. s/p DES to RCA (06/24/14)  . Dyspnea   . HTN (hypertension)   . Hyperlipidemia   . Myositis    chronic CK elevation ( 415 CK, ALL MM CK subtype, seen by Rheumatology and considered normal Variant)  . Palatal fistula   . Palpable abd. aorta   . RBBB   . Spinal stenosis    Dr Vaughan Sine  surgery  . Weakness      Past Surgical History:  Procedure Laterality Date  . CERVICAL FUSION  09/2009  . CORONARY ANGIOPLASTY    . CORONARY STENT PLACEMENT   06/24/2014   DES to RCA  . EYE SURGERY     Lasik  . HYDROCELE EXCISION    . LEFT HEART CATHETERIZATION WITH CORONARY ANGIOGRAM N/A 06/24/2014   Procedure: LEFT HEART CATHETERIZATION WITH CORONARY ANGIOGRAM;  Surgeon: Troy Sine, MD;  Location: [facility] CATH LAB;  Service: Cardiovascular;  Laterality: N/A;  . LUMBAR SPINE SURGERY  5/13  . PERCUTANEOUS CORONARY STENT INTERVENTION (PCI-S)  06/24/2014   Procedure: PERCUTANEOUS CORONARY STENT INTERVENTION (PCI-S);  Surgeon: Troy Sine, MD;  Location: [facility] CATH LAB;  Service: Cardiovascular;;  Distal RCA     Family History  Problem Relation Age of Onset  . Heart attack Mother   . Cancer Mother        esophageal  . Aneurysm Father   . Heart disease Father   . 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 Stroke Neg Hx      Social History   Substance and Sexual Activity  Drug Use No     Social History   Substance and Sexual Activity  Alcohol Use Yes  . Alcohol/week: 0.6 oz  . Types: 1 Cans of beer per  week     Social History   Tobacco Use  Smoking Status Former Smoker  . Packs/day: 0.75  . Years: 23.00  . Pack years: 17.25  . Types: Cigarettes  . Last attempt to quit: 07/26/1989  . Years since quitting: 28.5  Smokeless Tobacco Never Used     Outpatient Encounter Medications as of 02/15/2018  Medication Sig  . aspirin EC 81 MG EC tablet Take 1 tablet (81 mg total) by mouth daily.  Marland Kitchen atenolol (TENORMIN) 25 MG tablet Take 1 tablet (25 mg total) by mouth 2 (two) times daily.  Marland Kitchen atorvastatin (LIPITOR) 10 MG tablet Take 1 tablet (10 mg total) by mouth daily.  Marland Kitchen b complex vitamins tablet Take 1 tablet by mouth daily.  . Coenzyme Q10 (CO Q 10 PO) Take 1 tablet by mouth daily.   . DiphenhydrAMINE HCl (BENADRYL ALLERGY PO) Take 2 tablets by mouth at bedtime as needed (for sleep).   . FOLIC ACID PO Take 1 tablet by mouth daily.  Marland Kitchen ibuprofen (ADVIL,MOTRIN) 800  MG tablet Take 800 mg by mouth as directed.  Marland Kitchen KRILL OIL PO Take 1 tablet by mouth daily. 1 tab daily  . lisinopril (PRINIVIL,ZESTRIL) 10 MG tablet Take 1 tablet (10 mg total) by mouth daily.  Marland Kitchen MELATONIN PO Take by mouth as directed.  . nitroGLYCERIN (NITROSTAT) 0.4 MG SL tablet Place 1 tablet (0.4 mg total) under the tongue every 5 (five) minutes as needed for chest pain.  . saw palmetto 160 MG capsule Take 160 mg by mouth daily.  Marland Kitchen triamcinolone cream (KENALOG) 0.1 % Apply 1 application topically as directed.  . cyclobenzaprine (FLEXERIL) 10 MG tablet Take 1 tablet (10 mg total) by mouth at bedtime.  . [DISCONTINUED] furosemide (LASIX) 40 MG tablet Take 1 tablet (40 mg total) by mouth daily as needed.  . [DISCONTINUED] OVER THE COUNTER MEDICATION Collegen type 2-- as directed   No facility-administered encounter medications on file as of 02/15/2018.     Allergies: Patient has no known allergies.  Body mass index is 39.61 kg/m.  Blood pressure 113/71, pulse 71, height 5\' 11"  (1.803 m), weight 284 lb (128.8 kg), SpO2 97 %.  Review of Systems  Constitutional: Positive for activity change and fatigue. Negative for appetite change, chills, diaphoresis, fever and unexpected weight change.  Eyes: Negative for visual disturbance.  Respiratory: Negative for cough, chest tightness, shortness of breath, wheezing and stridor.   Cardiovascular: Positive for leg swelling. Negative for chest pain and palpitations.  Gastrointestinal: Negative for abdominal distention and abdominal pain.  Genitourinary: Negative for difficulty urinating and flank pain.  Musculoskeletal: Positive for arthralgias, back pain, gait problem, joint swelling, myalgias, neck pain and neck stiffness.  Skin: Positive for wound. Negative for color change, pallor and rash.  Neurological: Positive for weakness. Negative for dizziness and headaches.  Hematological: Does not bruise/bleed easily.  Psychiatric/Behavioral: Negative  for confusion, decreased concentration, dysphoric mood, hallucinations, self-injury, sleep disturbance and suicidal ideas. The patient is not nervous/anxious and is not hyperactive.        Objective:   Physical Exam  Constitutional: He is oriented to person, place, and time. He appears well-developed and well-nourished. No distress.  HENT:  Head: Normocephalic and atraumatic.  Right Ear: External ear normal.  Left Ear: External ear normal.  Nose: Nose normal.  Mouth/Throat: Oropharynx is clear and moist.  Eyes: Pupils are equal, round, and reactive to light. Conjunctivae and EOM are normal.  Cardiovascular: Normal rate, regular rhythm, normal heart  sounds and intact distal pulses.  No murmur heard. Pulmonary/Chest: Effort normal and breath sounds normal. No stridor. No respiratory distress. He has no wheezes. He has no rales. He exhibits no tenderness.  Musculoskeletal: Normal range of motion. He exhibits edema and tenderness.       Right lower leg: He exhibits swelling.       Left lower leg: He exhibits swelling.       Right foot: There is swelling.       Left foot: There is swelling.  Neurological: He is alert and oriented to person, place, and time. He exhibits abnormal muscle tone.  Skin: Skin is warm and dry. Capillary refill takes less than 2 seconds. No rash noted. He is not diaphoretic. No erythema. No pallor.  Psychiatric: He has a normal mood and affect. His behavior is normal. Judgment and thought content normal.  Vitals reviewed.     Assessment & Plan:   1. Healthcare maintenance   2. Hyperlipidemia, unspecified hyperlipidemia type   3. Hypertension, unspecified type   4. Upper extremity weakness   5. Weakness of both lower extremities   6. Weakness     HTN (hypertension) BP at goal 113/71, HR 71 Continue atenolol 25mg  BID,  Lisinopril 10mg  QD Followed by cards/Dr. [place] maintenance Continue all medications as directed. Cyclobenzaprine 10mg  as  needed at bedtime for right thigh muscle spasm. Increase water intake, strive for at least 64 oz/day Continue with Nutri-System Referral to Neurology placed. Follow up in 3 months for complete physical.  Hyperlipidemia The 10-year ASCVD risk score Mikey Bussing DC Jr., et al., 2013) is: 14.7%   Values used to calculate the score:     Age: 45 years     Sex: Male     Is Non-Hispanic African American: No     Diabetic: No     Tobacco smoker: No     Systolic Blood Pressure: 361 mmHg     Is BP treated: Yes     HDL Cholesterol: 46 mg/dL     Total Cholesterol: 160 mg/dL  LDL- 49 Currently on Atorvastatin 10mg  QD Currently on Nutri-System, has lost >14 lbs in last 3 weeks  Weakness Neurology referral placed    FOLLOW-UP:  Return in about 3 months (around 05/18/2018) for CPE.

## 2018-02-15 ENCOUNTER — Ambulatory Visit (INDEPENDENT_AMBULATORY_CARE_PROVIDER_SITE_OTHER): Payer: Medicare Other | Admitting: Adult Health

## 2018-02-15 ENCOUNTER — Encounter: Payer: Self-pay | Admitting: Adult Health

## 2018-02-15 VITALS — BP 113/71 | HR 71 | Ht 71.0 in | Wt 284.0 lb

## 2018-02-15 DIAGNOSIS — E785 Hyperlipidemia, unspecified: Secondary | ICD-10-CM | POA: Diagnosis not present

## 2018-02-15 DIAGNOSIS — Z0000 Encounter for general adult medical examination without abnormal findings: Secondary | ICD-10-CM | POA: Diagnosis not present

## 2018-02-15 DIAGNOSIS — I10 Essential (primary) hypertension: Secondary | ICD-10-CM

## 2018-02-15 DIAGNOSIS — R531 Weakness: Secondary | ICD-10-CM | POA: Diagnosis not present

## 2018-02-15 DIAGNOSIS — I251 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: Secondary | ICD-10-CM | POA: Diagnosis not present

## 2018-02-15 DIAGNOSIS — R29898 Other symptoms and signs involving the musculoskeletal system: Secondary | ICD-10-CM

## 2018-02-15 MED ORDER — CYCLOBENZAPRINE HCL 10 MG PO TABS: 10.0000 mg | ORAL_TABLET | Freq: Every day | ORAL | 0 refills | Status: DC

## 2018-02-15 NOTE — Assessment & Plan Note (Addendum)
BP at goal 113/71, HR 71 Continue atenolol 25mg  BID,  Lisinopril 10mg  QD Followed by cards/Dr. Louanne Skye

## 2018-02-15 NOTE — Patient Instructions (Signed)
Mediterranean Diet A Mediterranean diet refers to food and lifestyle choices that are based on the traditions of countries located on the [place]. This way of eating has been shown to help prevent certain conditions and improve outcomes for people who have chronic diseases, like kidney disease and heart disease. What are tips for following this plan? Lifestyle  Cook and eat meals together with your family, when possible.  Drink enough fluid to keep your urine clear or pale yellow.  Be physically active every day. This includes: ? Aerobic exercise like running or swimming. ? Leisure activities like gardening, walking, or housework.  Get 7-8 hours of sleep each night.  If recommended by your health care provider, drink red wine in moderation. This means 1 glass a day for nonpregnant women and 2 glasses a day for men. A glass of wine equals 5 oz (150 mL). Reading food labels  Check the serving size of packaged foods. For foods such as rice and pasta, the serving size refers to the amount of cooked product, not dry.  Check the total fat in packaged foods. Avoid foods that have saturated fat or trans fats.  Check the ingredients list for added sugars, such as corn syrup. Shopping  At the grocery store, buy most of your food from the areas near the walls of the store. This includes: ? Fresh fruits and vegetables (produce). ? Grains, beans, nuts, and seeds. Some of these may be available in unpackaged forms or large amounts (in bulk). ? Fresh seafood. ? Poultry and eggs. ? Low-fat dairy products.  Buy whole ingredients instead of prepackaged foods.  Buy fresh fruits and vegetables in-season from local farmers markets.  Buy frozen fruits and vegetables in resealable bags.  If you do not have access to quality fresh seafood, buy precooked frozen shrimp or canned fish, such as tuna, salmon, or sardines.  Buy small amounts of raw or cooked vegetables, salads, or olives from the  deli or salad bar at your store.  Stock your pantry so you always have certain foods on hand, such as olive oil, canned tuna, canned tomatoes, rice, pasta, and beans. Cooking  Cook foods with extra-virgin olive oil instead of using butter or other vegetable oils.  Have meat as a side dish, and have vegetables or grains as your main dish. This means having meat in small portions or adding small amounts of meat to foods like pasta or stew.  Use beans or vegetables instead of meat in common dishes like chili or lasagna.  Experiment with different cooking methods. Try roasting or broiling vegetables instead of steaming or sauteing them.  Add frozen vegetables to soups, stews, pasta, or rice.  Add nuts or seeds for added healthy fat at each meal. You can add these to yogurt, salads, or vegetable dishes.  Marinate fish or vegetables using olive oil, lemon juice, garlic, and fresh herbs. Meal planning  Plan to eat 1 vegetarian meal one day each week. Try to work up to 2 vegetarian meals, if possible.  Eat seafood 2 or more times a week.  Have healthy snacks readily available, such as: ? Vegetable sticks with hummus. ? Greek yogurt. ? Fruit and nut trail mix.  Eat balanced meals throughout the week. This includes: ? Fruit: 2-3 servings a day ? Vegetables: 4-5 servings a day ? Low-fat dairy: 2 servings a day ? Fish, poultry, or lean meat: 1 serving a day ? Beans and legumes: 2 or more servings a week ? Nuts   and seeds: 1-2 servings a day ? Whole grains: 6-8 servings a day ? Extra-virgin olive oil: 3-4 servings a day  Limit red meat and sweets to only a few servings a month What are my food choices?  Mediterranean diet ? Recommended ? Grains: Whole-grain pasta. Brown rice. Bulgar wheat. Polenta. Couscous. Whole-wheat bread. [place]. ? Vegetables: Artichokes. Beets. Broccoli. Cabbage. Carrots. Eggplant. Green beans. Chard. Kale. Spinach. Onions. Leeks. Peas. Squash.  Tomatoes. Peppers. Radishes. ? Fruits: Apples. Apricots. Avocado. Berries. Bananas. Cherries. Dates. Figs. Grapes. Lemons. Melon. Oranges. Peaches. Plums. Pomegranate. ? Meats and other protein foods: Beans. Almonds. Sunflower seeds. Pine nuts. Peanuts. [place]. Salmon. Scallops. Shrimp. [place]. Tilapia. Clams. Oysters. Eggs. ? Dairy: Low-fat milk. Cheese. Greek yogurt. ? Beverages: Water. Red wine. Herbal tea. ? Fats and oils: Extra virgin olive oil. Avocado oil. Grape seed oil. ? Sweets and desserts: [place] yogurt with honey. Baked apples. Poached pears. Trail mix. ? Seasoning and other foods: Basil. Cilantro. Coriander. Cumin. Mint. Parsley. Sage. Rosemary. Tarragon. Garlic. Oregano. Thyme. Pepper. Balsalmic vinegar. Tahini. Hummus. Tomato sauce. Olives. Mushrooms. ? Limit these ? Grains: Prepackaged pasta or rice dishes. Prepackaged cereal with added sugar. ? Vegetables: Deep fried potatoes (french fries). ? Fruits: Fruit canned in syrup. ? Meats and other protein foods: Beef. Pork. Lamb. Poultry with skin. Hot dogs. Berniece Salines. ? Dairy: Ice cream. Sour cream. Whole milk. ? Beverages: Juice. Sugar-sweetened soft drinks. Beer. Liquor and spirits. ? Fats and oils: Butter. Canola oil. Vegetable oil. Beef fat (tallow). Lard. ? Sweets and desserts: Cookies. Cakes. Pies. Candy. ? Seasoning and other foods: Mayonnaise. Premade sauces and marinades. ? The items listed may not be a complete list. Talk with your dietitian about what dietary choices are right for you. Summary  The Mediterranean diet includes both food and lifestyle choices.  Eat a variety of fresh fruits and vegetables, beans, nuts, seeds, and whole grains.  Limit the amount of red meat and sweets that you eat.  Talk with your health care provider about whether it is safe for you to drink red wine in moderation. This means 1 glass a day for nonpregnant women and 2 glasses a day for men. A glass of wine equals 5 oz (150 mL). This information  is not intended to replace advice given to you by your health care provider. Make sure you discuss any questions you have with your health care provider. Document Released: 03/04/2016 Document Revised: 04/06/2016 Document Reviewed: 03/04/2016 Elsevier Interactive Patient Education  2018 [place] all medications as directed. Cyclobenzaprine 10mg  as needed at bedtime for right thigh muscle spasm. Increase water intake, strive for at least 64 oz/day Continue with Nutri-System Referral to Neurology placed. Follow up in 3 months for complete physical. WELCOME TO THE PRACTICE

## 2018-02-15 NOTE — Assessment & Plan Note (Signed)
Continue all medications as directed. Cyclobenzaprine 10mg  as needed at bedtime for right thigh muscle spasm. Increase water intake, strive for at least 64 oz/day Continue with Nutri-System Referral to Neurology placed. Follow up in 3 months for complete physical.

## 2018-02-15 NOTE — Assessment & Plan Note (Signed)
Neurology referral placed

## 2018-02-15 NOTE — Assessment & Plan Note (Signed)
The 10-year ASCVD risk score Mikey Bussing DC Brooke Bonito., et al., 2013) is: 14.7%   Values used to calculate the score:     Age: 67 years     Sex: Male     Is Non-Hispanic African American: No     Diabetic: No     Tobacco smoker: No     Systolic Blood Pressure: 269 mmHg     Is BP treated: Yes     HDL Cholesterol: 46 mg/dL     Total Cholesterol: 160 mg/dL  LDL- 49 Currently on Atorvastatin 10mg  QD Currently on Nutri-System, has lost >14 lbs in last 3 weeks

## 2018-02-20 ENCOUNTER — Encounter: Payer: Self-pay | Admitting: Adult Health

## 2018-03-08 DIAGNOSIS — L821 Other seborrheic keratosis: Secondary | ICD-10-CM | POA: Diagnosis not present

## 2018-03-08 DIAGNOSIS — L738 Other specified follicular disorders: Secondary | ICD-10-CM | POA: Diagnosis not present

## 2018-03-08 DIAGNOSIS — D225 Melanocytic nevi of trunk: Secondary | ICD-10-CM | POA: Diagnosis not present

## 2018-03-08 DIAGNOSIS — L300 Nummular dermatitis: Secondary | ICD-10-CM | POA: Diagnosis not present

## 2018-03-08 DIAGNOSIS — D692 Other nonthrombocytopenic purpura: Secondary | ICD-10-CM | POA: Diagnosis not present

## 2018-03-08 DIAGNOSIS — D485 Neoplasm of uncertain behavior of skin: Secondary | ICD-10-CM | POA: Diagnosis not present

## 2018-03-08 DIAGNOSIS — L570 Actinic keratosis: Secondary | ICD-10-CM | POA: Diagnosis not present

## 2018-03-08 DIAGNOSIS — L814 Other melanin hyperpigmentation: Secondary | ICD-10-CM | POA: Diagnosis not present

## 2018-03-21 DIAGNOSIS — L570 Actinic keratosis: Secondary | ICD-10-CM | POA: Diagnosis not present

## 2018-04-14 ENCOUNTER — Telehealth: Payer: Self-pay | Admitting: Diagnostic Neuroimaging

## 2018-04-14 ENCOUNTER — Encounter: Payer: Self-pay | Admitting: Diagnostic Neuroimaging

## 2018-04-14 ENCOUNTER — Ambulatory Visit (INDEPENDENT_AMBULATORY_CARE_PROVIDER_SITE_OTHER): Payer: Medicare Other | Admitting: Diagnostic Neuroimaging

## 2018-04-14 VITALS — BP 142/80 | HR 56 | Ht 71.0 in | Wt 272.4 lb

## 2018-04-14 DIAGNOSIS — I251 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: Secondary | ICD-10-CM

## 2018-04-14 DIAGNOSIS — M6281 Muscle weakness (generalized): Secondary | ICD-10-CM

## 2018-04-14 NOTE — Progress Notes (Signed)
[facility]  PATIENT: [name] DOB: March 02, 1951  REFERRING CLINICIAN:  K Danford HISTORY FROM: patient and chart review  REASON FOR VISIT: new consult    HISTORICAL  CHIEF COMPLAINT:  Chief Complaint  Patient presents with  . New Patient (Initial Visit)    Rm 7, alone.   Marland Kitchen BIL UE/LE weakness, brought previous xrays and cds    referral by Mina Marble, NP.  From 12/2017 had R thigh pain, tried muscle relaxer did not make difference.     HISTORY OF PRESENT ILLNESS:   67 year old male here for evaluation of upper and lower extremity weakness.  Around 2009 patient noticed weakness in the right upper extremity.  He was having some lower extremity weakness as well.  He was living in [place] at the time and sought medical attention.  He was diagnosed with cervical degenerative spine disease and underwent surgery in 2011.  Symptoms not improved.  He also had complication of lung infection.  Around 2013 he was diagnosed with lumbar spinal stenosis, in the setting of low back pain radiating to the left leg.  He underwent lumbar spine surgery with improvement in the left "sciatica" pain.  Weakness did not improve.  Over time symptoms progressively worsened.  In 2014 he moved to [place].  Since that time he has had gradual and progressive weakness in his fingers, hands, feet and ankles.  He does have some intermittent numbness.   REVIEW OF SYSTEMS: Full 14 system review of systems performed and negative with exception of: Weakness easy bleeding joint pain hearing loss.  History of coronary artery disease.  History of right eye blurred vision.   ALLERGIES: No Known Allergies  HOME MEDICATIONS: Outpatient Medications Prior to Visit  Medication Sig Dispense Refill  . aspirin EC 81 MG EC tablet Take 1 tablet (81 mg total) by mouth daily.    Marland Kitchen atenolol (TENORMIN) 25 MG tablet Take 1 tablet (25 mg total) by mouth 2 (two) times daily. 180 tablet 3  . atorvastatin  (LIPITOR) 10 MG tablet Take 1 tablet (10 mg total) by mouth daily. 90 tablet 3  . b complex vitamins tablet Take 1 tablet by mouth daily.    . cholecalciferol (VITAMIN D) 1000 units tablet Take 1,000 Units by mouth daily.    . Coenzyme Q10 (CO Q 10 PO) Take 1 tablet by mouth daily.     Marland Kitchen FOLIC ACID PO Take 1 tablet by mouth daily.    Marland Kitchen ibuprofen (ADVIL,MOTRIN) 800 MG tablet Take 800 mg by mouth as directed.    Marland Kitchen KRILL OIL PO Take 1 tablet by mouth daily. 1 tab daily    . lisinopril (PRINIVIL,ZESTRIL) 10 MG tablet Take 1 tablet (10 mg total) by mouth daily. 90 tablet 3  . triamcinolone cream (KENALOG) 0.1 % Apply 1 application topically as directed.    . DiphenhydrAMINE HCl (BENADRYL ALLERGY PO) Take 2 tablets by mouth at bedtime as needed (for sleep).     . MELATONIN PO Take by mouth as directed.    . saw palmetto 160 MG capsule Take 160 mg by mouth daily.    . cyclobenzaprine (FLEXERIL) 10 MG tablet Take 1 tablet (10 mg total) by mouth at bedtime. (Patient not taking: Reported on 04/14/2018) 30 tablet 0  . nitroGLYCERIN (NITROSTAT) 0.4 MG SL tablet Place 1 tablet (0.4 mg total) under the tongue every 5 (five) minutes as needed for chest pain. (Patient not taking: Reported on 04/14/2018) 90 tablet 3  No facility-administered medications prior to visit.     PAST MEDICAL HISTORY: Past Medical History:  Diagnosis Date  . Coronary artery disease    a. s/p BMS to LAD (1998) b. s/p DES to RCA (06/24/14)  . Dyspnea   . HTN (hypertension)   . Hyperlipidemia   . Myositis    chronic CK elevation ( 415 CK, ALL MM CK subtype, seen by Rheumatology and considered normal Variant)  . Palatal fistula   . Palpable abd. aorta   . RBBB   . Spinal stenosis    Dr Vaughan Sine  surgery  . Weakness     PAST SURGICAL HISTORY: Past Surgical History:  Procedure Laterality Date  . CERVICAL FUSION  09/2009  . CORONARY ANGIOPLASTY    . CORONARY STENT PLACEMENT  06/24/2014   DES to RCA  . EYE SURGERY      Lasik  . HYDROCELE EXCISION    . LEFT HEART CATHETERIZATION WITH CORONARY ANGIOGRAM N/A 06/24/2014   Procedure: LEFT HEART CATHETERIZATION WITH CORONARY ANGIOGRAM;  Surgeon: Troy Sine, MD;  Location: [facility] CATH LAB;  Service: Cardiovascular;  Laterality: N/A;  . LUMBAR SPINE SURGERY  5/13  . PERCUTANEOUS CORONARY STENT INTERVENTION (PCI-S)  06/24/2014   Procedure: PERCUTANEOUS CORONARY STENT INTERVENTION (PCI-S);  Surgeon: Troy Sine, MD;  Location[facility] CATH LAB;  Service: Cardiovascular;;  Distal RCA    FAMILY HISTORY: Family History  Problem Relation Age of Onset  . Heart attack Mother   . Cancer Mother        esophageal  . Aneurysm Father   . Heart disease Father   . 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 Stroke Neg Hx     SOCIAL HISTORY: Social History   Socioeconomic History  . Marital status: Married    Spouse name: Not on file  . Number of children: Not on file  . Years of education: Not on file  . Highest education level: Not on file  Occupational History  . Not on file  Social Needs  . Financial resource strain: Not on file  . Food insecurity:    Worry: Not on file    Inability: Not on file  . Transportation needs:    Medical: Not on file    Non-medical: Not on file  Tobacco Use  . Smoking status: Former Smoker    Packs/day: 0.75    Years: 23.00    Pack years: 17.25    Types: Cigarettes    Last attempt to quit: 07/26/1989    Years since quitting: 28.7  . Smokeless tobacco: Never Used  Substance and Sexual Activity  . Alcohol use: Yes    Comment: 3-6 cans per week  . Drug use: No  . Sexual activity: Yes    Birth control/protection: None  Lifestyle  . Physical activity:    Days per week: Not on file    Minutes per session: Not on file  . Stress: Not on file  Relationships  . Social connections:    Talks on phone: Not on file    Gets together: Not on file    Attends  religious service: Not on file    Active member of club or organization: Not on file    Attends meetings of clubs or organizations: Not on file    Relationship status: Not on file  . Intimate partner violence:  Fear of current or ex partner: Not on file    Emotionally abused: Not on file    Physically abused: Not on file    Forced sexual activity: Not on file  Other Topics Concern  . Not on file  Social History Narrative   Married, wife moving to [place]   Retired , prior occupation self   Patient is a former smoker   Alcohol use - yes   Regular exercise   Drug Use- no    caffeine 3 cups coffee daily     PHYSICAL EXAM  GENERAL EXAM/CONSTITUTIONAL: Vitals:  Vitals:   04/14/18 0836  BP: (!) 142/80  Pulse: (!) 56  Weight: 272 lb 6.4 oz (123.6 kg)  Height: 5\' 11"  (1.803 m)     Body mass index is 37.99 kg/m. Wt Readings from Last 3 Encounters:  04/14/18 272 lb 6.4 oz (123.6 kg)  02/15/18 284 lb (128.8 kg)  11/28/17 296 lb (134.3 kg)     Patient is in no distress; well developed, nourished and groomed; neck is supple  CARDIOVASCULAR:  Examination of carotid arteries is normal; no carotid bruits  Regular rate and rhythm, no murmurs  Examination of peripheral vascular system by observation and palpation is normal  EYES:  Ophthalmoscopic exam of optic discs and posterior segments is normal; no papilledema or hemorrhages  No exam data present  MUSCULOSKELETAL:  Gait, strength, tone, movements noted in Neurologic exam below  NEUROLOGIC: MENTAL STATUS:  No flowsheet data found.  awake, alert, oriented to person, place and time  recent and remote memory intact  normal attention and concentration  language fluent, comprehension intact, naming intact  fund of knowledge appropriate  CRANIAL NERVE:   2nd - no papilledema on fundoscopic exam  2nd, 3rd, 4th, 6th - pupils equal and reactive to light, visual fields full to confrontation, extraocular muscles  intact, no nystagmus  5th - facial sensation symmetric  7th - facial strength symmetric  8th - hearing intact  9th - palate elevates symmetrically, uvula midline  11th - shoulder shrug symmetric  12th - tongue protrusion midline  MOTOR:   RIGHT UPPER EXT TRICEPS / BICEPS 3  BILATERAL HAND FINGER FLEXION 2-3; FNGER ABDUCTION 3, FINGER EXTENSION 4  BILATERAL HIP FLEXION 4  BILATERAL FOOT DORSIFLEXION 3; PLANTAR FLEXION 4  normal bulk and tone  SENSORY:   normal and symmetric to light touch; DECR VIB AT TOES  COORDINATION:   finger-nose-finger, fine finger movements normal  REFLEXES:   deep tendon reflexes --> BUE TRACE; KNEES 1; ANKLES 0  GAIT/STATION:   narrow based gait; CANNOT WALK ON TOES OR HEELS     DIAGNOSTIC DATA (LABS, IMAGING, TESTING) - I reviewed patient records, labs, notes, testing and imaging myself where available.  Lab Results  Component Value Date   WBC 6.5 11/28/2017   HGB 13.7 11/28/2017   HCT 40.4 11/28/2017   MCV 97 11/28/2017   PLT 147 (L) 11/28/2017      Component Value Date/Time   NA 141 11/28/2017 1537   K 4.9 11/28/2017 1537   CL 103 11/28/2017 1537   CO2 22 11/28/2017 1537   GLUCOSE 87 11/28/2017 1537   GLUCOSE 83 04/26/2016 1135   BUN 29 (H) 11/28/2017 1537   CREATININE 1.17 11/28/2017 1537   CREATININE 0.96 04/26/2016 1135   CALCIUM 9.1 11/28/2017 1537   PROT 6.6 10/03/2014 0850   ALBUMIN 4.2 10/03/2014 0850   AST 16 10/03/2014 0850   ALT 20 10/03/2014 0850  ALKPHOS 50 10/03/2014 0850   BILITOT 0.8 10/03/2014 0850   GFRNONAA 63 11/28/2017 1537   GFRAA 73 11/28/2017 1537   Lab Results  Component Value Date   CHOL 160 11/28/2017   HDL 46 11/28/2017   LDLCALC 49 11/28/2017   TRIG 325 (H) 11/28/2017   CHOLHDL 3.5 11/28/2017   No results found for: HGBA1C No results found for: VITAMINB12 Lab Results  Component Value Date   TSH 2.220 11/28/2017    05/14/10 MRI lumbar spine [I reviewed images myself.  -[facility]]  - moderate spinal stenosis at L4-5  07/12/12 MRI cervical spine [I reviewed images myself. -[facility]] - post op C4-5, C5-6 - multilevel foraminal stenosis from C3-4 to C6-7    ASSESSMENT AND PLAN  67 y.o. year old male here with history of upper and lower extremity weakness since 2009 / 2010, status post cervical and lumbar spine decompression surgeries, with continued progression of weakness in hands and feet.   Ddx: cervical + lumbar degenerative spine disease (polyradiculopathy) vs polyneuropathy vs CIDP  1. Muscle weakness      PLAN:  - repeat MRI cervical and lumbar spine (rule out radiculopathies / spinal stenosis) - check EMG/NCS (rule out CIDP)  Orders Placed This Encounter  Procedures  . MR CERVICAL SPINE WO CONTRAST  . MR LUMBAR SPINE WO CONTRAST  . NCV with EMG(electromyography)   Return for for NCV/EMG.  I reviewed images, labs, notes, records myself. I summarized findings and reviewed with patient, for this high risk condition (weakness; spinal stenosis) requiring high complexity decision making.     Penni Bombard, MD 12/28/5407, 8:11 AM Certified in Neurology, Neurophysiology and Neuroimaging  [facility] [address] [place], [place] [address] [phone]

## 2018-04-14 NOTE — Telephone Encounter (Signed)
[place]ic[place] of [place] order sent to GI. No auth they will reach out to the pt to schedule.  °

## 2018-04-14 NOTE — Patient Instructions (Signed)
Will check MRI cervical / lumbar spine  Will check EMG NCS (nerve testing)

## 2018-04-20 ENCOUNTER — Other Ambulatory Visit (INDEPENDENT_AMBULATORY_CARE_PROVIDER_SITE_OTHER): Payer: Medicare Other

## 2018-04-20 DIAGNOSIS — Z0000 Encounter for general adult medical examination without abnormal findings: Secondary | ICD-10-CM

## 2018-04-20 DIAGNOSIS — I10 Essential (primary) hypertension: Secondary | ICD-10-CM | POA: Diagnosis not present

## 2018-04-20 DIAGNOSIS — E785 Hyperlipidemia, unspecified: Secondary | ICD-10-CM | POA: Diagnosis not present

## 2018-04-21 LAB — CBC WITH DIFFERENTIAL/PLATELET
Basophils Absolute: 0 x10E3/uL (ref 0.0–0.2)
Basos: 1 %
EOS (ABSOLUTE): 0.3 x10E3/uL (ref 0.0–0.4)
Eos: 9 %
Hematocrit: 37.8 % (ref 37.5–51.0)
Hemoglobin: 12.8 g/dL — ABNORMAL LOW (ref 13.0–17.7)
Immature Grans (Abs): 0 x10E3/uL (ref 0.0–0.1)
Immature Granulocytes: 0 %
Lymphocytes Absolute: 0.9 x10E3/uL (ref 0.7–3.1)
Lymphs: 25 %
MCH: 32.1 pg (ref 26.6–33.0)
MCHC: 33.9 g/dL (ref 31.5–35.7)
MCV: 95 fL (ref 79–97)
Monocytes Absolute: 0.5 x10E3/uL (ref 0.1–0.9)
Monocytes: 14 %
Neutrophils Absolute: 1.9 x10E3/uL (ref 1.4–7.0)
Neutrophils: 51 %
Platelets: 129 x10E3/uL — ABNORMAL LOW (ref 150–450)
RBC: 3.99 x10E6/uL — ABNORMAL LOW (ref 4.14–5.80)
RDW: 12.5 % (ref 12.3–15.4)
WBC: 3.7 x10E3/uL (ref 3.4–10.8)

## 2018-04-21 LAB — COMPREHENSIVE METABOLIC PANEL WITH GFR
ALT: 14 IU/L (ref 0–44)
AST: 15 IU/L (ref 0–40)
Albumin/Globulin Ratio: 2.4 — ABNORMAL HIGH (ref 1.2–2.2)
Albumin: 4 g/dL (ref 3.6–4.8)
Alkaline Phosphatase: 46 IU/L (ref 39–117)
BUN/Creatinine Ratio: 24 (ref 10–24)
BUN: 23 mg/dL (ref 8–27)
Bilirubin Total: 0.4 mg/dL (ref 0.0–1.2)
CO2: 24 mmol/L (ref 20–29)
Calcium: 9.3 mg/dL (ref 8.6–10.2)
Chloride: 106 mmol/L (ref 96–106)
Creatinine, Ser: 0.97 mg/dL (ref 0.76–1.27)
GFR calc Af Amer: 92 mL/min/1.73
GFR calc non Af Amer: 79 mL/min/1.73
Globulin, Total: 1.7 g/dL (ref 1.5–4.5)
Glucose: 86 mg/dL (ref 65–99)
Potassium: 4.9 mmol/L (ref 3.5–5.2)
Sodium: 143 mmol/L (ref 134–144)
Total Protein: 5.7 g/dL — ABNORMAL LOW (ref 6.0–8.5)

## 2018-04-21 LAB — LIPID PANEL
Chol/HDL Ratio: 3.2 ratio (ref 0.0–5.0)
Cholesterol, Total: 136 mg/dL (ref 100–199)
HDL: 42 mg/dL
LDL Calculated: 80 mg/dL (ref 0–99)
Triglycerides: 69 mg/dL (ref 0–149)
VLDL Cholesterol Cal: 14 mg/dL (ref 5–40)

## 2018-04-21 LAB — HEMOGLOBIN A1C
Est. average glucose Bld gHb Est-mCnc: 100 mg/dL
Hgb A1c MFr Bld: 5.1 % (ref 4.8–5.6)

## 2018-04-21 LAB — TSH: TSH: 2.11 u[IU]/mL (ref 0.450–4.500)

## 2018-04-24 ENCOUNTER — Other Ambulatory Visit: Payer: Self-pay | Admitting: Internal Medicine

## 2018-04-24 MED ORDER — TRIAMCINOLONE ACETONIDE 0.1 % EX CREA: 1.0000 "application " | TOPICAL_CREAM | CUTANEOUS | 3 refills | Status: DC

## 2018-04-24 NOTE — Telephone Encounter (Signed)
Pt. Pharmacy [facility] is requesting a refill on triamcinolone cream and betamethason aug 0.05% cream. Would Dr. Harrington Challenger like to refill this medication? Please address

## 2018-04-27 ENCOUNTER — Encounter: Payer: Self-pay | Admitting: Adult Health

## 2018-04-27 ENCOUNTER — Ambulatory Visit (INDEPENDENT_AMBULATORY_CARE_PROVIDER_SITE_OTHER): Payer: Medicare Other | Admitting: Adult Health

## 2018-04-27 VITALS — BP 127/76 | HR 65 | Ht 71.0 in | Wt 274.9 lb

## 2018-04-27 DIAGNOSIS — Z23 Encounter for immunization: Secondary | ICD-10-CM | POA: Diagnosis not present

## 2018-04-27 DIAGNOSIS — Z1211 Encounter for screening for malignant neoplasm of colon: Secondary | ICD-10-CM | POA: Insufficient documentation

## 2018-04-27 DIAGNOSIS — Z0000 Encounter for general adult medical examination without abnormal findings: Secondary | ICD-10-CM

## 2018-04-27 NOTE — Progress Notes (Signed)
Subjective:   [name] is a 67 y.o. male who presents for Medicare Annual/Subsequent preventive examination.  Review of Systems: General:   Denies fever, chills, unexplained weight loss.  Optho/Auditory:   Denies visual changes, blurred vision/LOV Respiratory:   Denies SOB, DOE more than baseline levels.  Cardiovascular:   Denies chest pain, palpitations, new onset peripheral edema  Gastrointestinal:   Denies nausea, vomiting, diarrhea.  Genitourinary: Denies dysuria, freq/ urgency, flank pain or discharge from genitals.  Endocrine:     Denies hot or cold intolerance, polyuria, polydipsia. Musculoskeletal:   Denies unexplained myalgias, joint swelling,   arthralgias +,  gait problems +  Skin:  Denies rash, suspicious lesions Neurological:     Denies dizziness, weakness +, numbness +  Psychiatric/Behavioral:   Denies mood changes, suicidal or homicidal ideations, hallucinations       Objective:    Vitals: BP 127/76   Pulse 65   Ht 5\' 11"  (1.803 m)   Wt 274 lb 14.4 oz (124.7 kg)   SpO2 98%   BMI 38.34 kg/m   Body mass index is 38.34 kg/m.  Advanced Directives 02/15/2018 06/24/2014  Does Patient Have a Medical Advance Directive? No No  Would patient like information on creating a medical advance directive? No - Patient declined No - patient declined information    Tobacco Social History   Tobacco Use  Smoking Status Former Smoker  . Packs/day: 0.75  . Years: 23.00  . Pack years: 17.25  . Types: Cigarettes  . Last attempt to quit: 07/26/1989  . Years since quitting: 28.7  Smokeless Tobacco Never Used     Counseling given: Not Answered   Past Medical History:  Diagnosis Date  . Coronary artery disease    a. s/p BMS to LAD (1998) b. s/p DES to RCA (06/24/14)  . Dyspnea   . HTN (hypertension)   . Hyperlipidemia   . Myositis    chronic CK elevation ( 415 CK, ALL MM CK subtype, seen by Rheumatology and considered normal Variant)  . Palatal fistula   . Palpable  abd. aorta   . RBBB   . Spinal stenosis    Dr Vaughan Sine  surgery  . Weakness    Past Surgical History:  Procedure Laterality Date  . CERVICAL FUSION  09/2009  . CORONARY ANGIOPLASTY    . CORONARY STENT PLACEMENT  06/24/2014   DES to RCA  . EYE SURGERY     Lasik  . HYDROCELE EXCISION    . LEFT HEART CATHETERIZATION WITH CORONARY ANGIOGRAM N/A 06/24/2014   Procedure: LEFT HEART CATHETERIZATION WITH CORONARY ANGIOGRAM;  Surgeon: Troy Sine, MD;  Location: [facility] CATH LAB;  Service: Cardiovascular;  Laterality: N/A;  . LUMBAR SPINE SURGERY  5/13  . PERCUTANEOUS CORONARY STENT INTERVENTION (PCI-S)  06/24/2014   Procedure: PERCUTANEOUS CORONARY STENT INTERVENTION (PCI-S);  Surgeon: Troy Sine, MD;  Location: [facility] CATH LAB;  Service: Cardiovascular;;  Distal RCA   Family History  Problem Relation Age of Onset  . Heart attack Mother   . Cancer Mother        esophageal  . Aneurysm Father   . Heart disease Father   . 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 Stroke Neg Hx    Social History   Socioeconomic History  . Marital status: Married    Spouse name: Not on file  . Number of  children: Not on file  . Years of education: Not on file  . Highest education level: Not on file  Occupational History  . Not on file  Social Needs  . Financial resource strain: Not on file  . Food insecurity:    Worry: Not on file    Inability: Not on file  . Transportation needs:    Medical: Not on file    Non-medical: Not on file  Tobacco Use  . Smoking status: Former Smoker    Packs/day: 0.75    Years: 23.00    Pack years: 17.25    Types: Cigarettes    Last attempt to quit: 07/26/1989    Years since quitting: 28.7  . Smokeless tobacco: Never Used  Substance and Sexual Activity  . Alcohol use: Yes    Comment: 3-6 cans per week  . Drug use: No  . Sexual activity: Yes    Birth control/protection: None  Lifestyle  .  Physical activity:    Days per week: Not on file    Minutes per session: Not on file  . Stress: Not on file  Relationships  . Social connections:    Talks on phone: Not on file    Gets together: Not on file    Attends religious service: Not on file    Active member of club or organization: Not on file    Attends meetings of clubs or organizations: Not on file    Relationship status: Not on file  Other Topics Concern  . Not on file  Social History Narrative   Married, wife moving to [place]   Retired , prior occupation self   Patient is a former smoker   Alcohol use - yes   Regular exercise   Drug Use- no    caffeine 3 cups coffee daily    Outpatient Encounter Medications as of 04/27/2018  Medication Sig  . aspirin EC 81 MG EC tablet Take 1 tablet (81 mg total) by mouth daily.  Marland Kitchen atenolol (TENORMIN) 25 MG tablet Take 1 tablet (25 mg total) by mouth 2 (two) times daily.  Marland Kitchen atorvastatin (LIPITOR) 10 MG tablet Take 1 tablet (10 mg total) by mouth daily.  Marland Kitchen b complex vitamins tablet Take 1 tablet by mouth daily.  . cholecalciferol (VITAMIN D) 1000 units tablet Take 1,000 Units by mouth daily.  . Coenzyme Q10 (CO Q 10 PO) Take 1 tablet by mouth daily.   . cyclobenzaprine (FLEXERIL) 10 MG tablet Take 1 tablet (10 mg total) by mouth at bedtime.  Marland Kitchen FOLIC ACID PO Take 1 tablet by mouth daily.  Marland Kitchen ibuprofen (ADVIL,MOTRIN) 800 MG tablet Take 800 mg by mouth as directed.  Marland Kitchen KRILL OIL PO Take 1 tablet by mouth daily. 1 tab daily  . lisinopril (PRINIVIL,ZESTRIL) 10 MG tablet Take 1 tablet (10 mg total) by mouth daily.  . nitroGLYCERIN (NITROSTAT) 0.4 MG SL tablet Place 1 tablet (0.4 mg total) under the tongue every 5 (five) minutes as needed for chest pain.  Marland Kitchen triamcinolone cream (KENALOG) 0.1 % Apply 1 application topically as directed.   No facility-administered encounter medications on file as of 04/27/2018.     Activities of Daily Living In your present state of health, do you have any  difficulty performing the following activities: 04/27/2018  Hearing? Y  Vision? N  Difficulty concentrating or making decisions? N  Walking or climbing stairs? N  Dressing or bathing? N  Doing errands, shopping? N  Some recent data might be hidden  Patient Care Team: Esaw Grandchild, NP as PCP - General (Family Medicine) [name], MD as Consulting Physician (Cardiology) Harriett Sine, MD as Consulting Physician (Dermatology)   Assessment:   This is a routine wellness examination for Mylin.  Exercise Activities and Dietary recommendations    Goals   None   Increase daily walking as much as tolerated Continue with assistive devices as needed.  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 Yes No  Number falls in past yr: 2 or more -  Injury with Fall? No -   Is the patient's home free of loose throw rugs in walkways, pet beds, electrical cords, etc?   yes      Grab bars in the bathroom? no      Handrails on the stairs?   yes      Adequate lighting?   yes  Timed Get Up and Go Performed: passed  Depression Screen PHQ 2/9 Scores 04/27/2018 02/15/2018  PHQ - 2 Score 0 1  PHQ- 9 Score 1 3    Cognitive Function  WNL      Immunization History  Administered Date(s) Administered  . Influenza, High Dose Seasonal PF 04/27/2018  . Influenza,inj,Quad PF,6+ Mos 06/25/2014, 04/26/2016    Qualifies for Shingles Vaccine? Information given to the patient    Screening Tests Health Maintenance  Topic Date Due  . TETANUS/TDAP  08/09/1967  . INFLUENZA VACCINE  02/23/2018  . COLONOSCOPY  04/28/2019 (Originally 08/08/1998)  . Hepatitis C Screening  04/28/2019 (Originally [birth year])  . PNA vac Low Risk Adult (1 of 2 - PCV13) 04/28/2019 (Originally 08/08/2013)   Cancer Screenings: Lung: Low Dose CT Chest recommended if Age 43-80 years, 30 pack-year currently smoking OR have  quit w/in 15years. Patient does not qualify. 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   Increase plain water intake Continue to follow Nutri-System Diet Move as often as possible Continue to avoid tobacco Continue all medications as directed Please call if you would like referral to Pain Clinic Continue with Cardiology and Neurology as directed Please follow up in 3 months, sooner if needed.  I have personally reviewed and noted the following in the patient's chart:   . Medical and social history . Use of alcohol, tobacco or illicit drugs  . Current medications and supplements . Functional ability and status . Nutritional status . Physical activity . Advanced directives . List of other physicians . Hospitalizations, surgeries, and ER visits in previous 12 months . Vitals . Screenings to include cognitive, depression, and falls . Referrals and appointments  In addition, I have reviewed and discussed with patient certain preventive protocols, quality metrics, and best practice recommendations. A written personalized care plan for preventive services as well as general preventive health recommendations were provided to patient.     Esaw Grandchild, NP  04/27/2018

## 2018-04-27 NOTE — Assessment & Plan Note (Signed)
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

## 2018-04-27 NOTE — Assessment & Plan Note (Signed)
Flu vaccination administered

## 2018-04-27 NOTE — Patient Instructions (Addendum)

## 2018-04-27 NOTE — Assessment & Plan Note (Signed)
GI referral placed

## 2018-05-04 ENCOUNTER — Ambulatory Visit (INDEPENDENT_AMBULATORY_CARE_PROVIDER_SITE_OTHER): Payer: Medicare Other | Admitting: Diagnostic Neuroimaging

## 2018-05-04 ENCOUNTER — Encounter (INDEPENDENT_AMBULATORY_CARE_PROVIDER_SITE_OTHER): Payer: Medicare Other | Admitting: Diagnostic Neuroimaging

## 2018-05-04 DIAGNOSIS — M6281 Muscle weakness (generalized): Secondary | ICD-10-CM | POA: Diagnosis not present

## 2018-05-04 DIAGNOSIS — R6889 Other general symptoms and signs: Secondary | ICD-10-CM | POA: Diagnosis not present

## 2018-05-04 DIAGNOSIS — Z0289 Encounter for other administrative examinations: Secondary | ICD-10-CM

## 2018-05-04 NOTE — Progress Notes (Signed)
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 Will proceed with lab testing and referral for muscle biopsy (LEFT VASTUS LATERALIS).   Orders Placed This Encounter  Procedures  . CK  . Aldolase  . Multiple Myeloma Panel (SPEP&IFE w/QIG)  . Anti-Jo 1 antibody, IgG  . Angiotensin converting enzyme  . Vitamin B12  . Ambulatory referral to Neurosurgery    Penni Bombard, MD 99/68/9570, 2:20 PM Certified in Neurology, Neurophysiology and Neuroimaging  [facility] [address], [place], [place] [address] [phone]

## 2018-05-08 NOTE — Procedures (Signed)
[facility]  NCS (NERVE CONDUCTION STUDY) WITH EMG (ELECTROMYOGRAPHY) REPORT   STUDY DATE: 05/04/18 PATIENT NAME: [name] DOB: May 04, 1951 MRN: [id number]  ORDERING CLINICIAN: Andrey Spearman, MD   TECHNOLOGIST: Orpah Melter y ELECTROMYOGRAPHER: Earlean Polka. Panagiotis Oelkers, MD  CLINICAL INFORMATION: 67 year old male here for evaluation of muscle weakness.  FINDINGS: NERVE CONDUCTION STUDY:  Right median and right ulnar motor responses are normal.  Bilateral peroneal and right tibial motor responses have normal distal latencies, decreased amplitudes, borderline slow conduction velocities.  Left tibial motor responses slightly prolonged distal latency, decreased amplitudes, mildly slow conduction velocities.  Left sural and left superficial peroneal sensory responses are slightly prolonged peak latencies and decreased amplitudes.  Right sural and right superficial peroneal sensory responses have normal peak latencies and decreased amplitude.  Right ulnar F-wave latency is normal.  Bilateral tibial F wave latencies are slightly prolonged.   NEEDLE ELECTROMYOGRAPHY:  Needle examination of right upper and right lower extremities notable for early recruitment of polyphasic motor units in right vastus medialis, right tibials anterior, right gastrocnemius, right flexor carpi radialis, right triceps.  Neurogenic recruitment noted in right deltoid and right biceps.  No abnormal spontaneous activity noted.   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 - Underlying mild axonal sensorimotor polyneuropathy.      INTERPRETING PHYSICIAN:  Penni Bombard, MD Certified in Neurology, Neurophysiology and Neuroimaging  [facility] [address] [place], [place] [address] [phone]  [facility]      Nerve / Sites Muscle Latency Ref. Amplitude Ref. Rel Amp Segments Distance Velocity Ref. Area    ms ms mV mV %  cm m/s m/s mVms  R Median - APB     Wrist APB 3.4 ?4.4 4.4 ?4.0 100 Wrist - APB 7   17.7     Upper arm APB 8.0  4.4  101 Upper arm - Wrist 24 52 ?49 17.6  R Ulnar - ADM     Wrist ADM 3.0 ?3.3 9.1 ?6.0 100 Wrist - ADM 7   27.4     B.Elbow ADM 7.0  8.0  87.8 B.Elbow - Wrist 20 49 ?49 23.1     A.Elbow ADM 8.9  7.8  97.4 A.Elbow - B.Elbow 10 53 ?49 23.6         A.Elbow - Wrist      R Peroneal - EDB     Ankle EDB 5.3 ?6.5 1.7 ?2.0 100 Ankle - EDB 9   3.7     Fib head EDB 12.4  1.4  82.7 Fib head - Ankle 30 42 ?44 3.6     Pop fossa EDB 15.3  1.4  98.1 Pop fossa - Fib head 12 42 ?44 3.5         Pop fossa - Ankle      L Peroneal - EDB     Ankle EDB 5.4 ?6.5 2.2 ?2.0 100 Ankle - EDB 9   7.3     Fib head EDB 12.9  1.9  87.1 Fib head - Ankle 32 42 ?44 6.5     Pop fossa EDB 15.4  2.0  106 Pop fossa - Fib head 10 40 ?44 7.3         Pop fossa - Ankle      R Tibial - AH     Ankle AH 5.2 ?5.8 1.0 ?4.0 100 Ankle -  AH 9   5.8     Pop fossa AH 16.3  0.8  79.3 Pop fossa - Ankle 42 38 ?41 5.3  L Tibial - AH     Ankle AH 6.0 ?5.8 1.7 ?4.0 100 Ankle - AH 9   3.7     Pop fossa AH 17.1  1.0  58.5 Pop fossa - Ankle 42 38 ?41 3.0                 SNC    Nerve / Sites Rec. Site Peak Lat Ref.  Amp Ref. Segments Distance    ms ms V V  cm  R Sural - Ankle (Calf)     Calf Ankle 4.0 ?4.4 3 ?6 Calf - Ankle 14  L Sural - Ankle (Calf)     Calf Ankle 4.5 ?4.4 5 ?6 Calf - Ankle 14  R Superficial peroneal - Ankle     Lat leg Ankle 4.2 ?4.4 3 ?6 Lat leg - Ankle 14  L Superficial peroneal - Ankle     Lat leg Ankle 4.5 ?4.4 2 ?6 Lat leg - Ankle 14  R Median - Orthodromic (Dig II, Mid palm)     Dig II Wrist 3.3 ?3.4 12 ?10 Dig II - Wrist 13  R Ulnar - Orthodromic, (Dig V, Mid palm)     Dig V Wrist 3.1 ?3.1 6 ?5 Dig V - Wrist 35                 F  Wave    Nerve F Lat Ref.   ms ms  R Tibial - AH 59.6  ?56.0  R Ulnar - ADM 30.7 ?32.0  L Tibial - AH 63.0 ?56.0           EMG full       EMG Summary Table    Spontaneous MUAP Recruitment  Muscle IA Fib PSW Fasc Other Amp Dur. Poly Pattern  R. Vastus medialis Normal None None None _______ Decreased Increased 3+ Early  R. Tibialis anterior Normal None None None _______ Decreased Increased 2+ Early  R. Gastrocnemius (Medial head) Normal None None None _______ Normal Normal 2+ Early  R. Deltoid Normal None None None _______ Increased Normal 1+ Reduced  R. Biceps brachii Normal None None None _______ Increased Normal 1+ Reduced  R. Flexor carpi radialis Normal None None None _______ Decreased Normal 3+ Early  R. Triceps brachii Normal None None None _______ Decreased Normal 1+ Early  R. First dorsal interosseous Normal None None None _______ Normal Normal Normal Normal

## 2018-05-09 LAB — MULTIPLE MYELOMA PANEL, SERUM
ALBUMIN/GLOB SERPL: 1.5 (ref 0.7–1.7)
ALPHA 1: 0.2 g/dL (ref 0.0–0.4)
Albumin SerPl Elph-Mcnc: 3.7 g/dL (ref 2.9–4.4)
Alpha2 Glob SerPl Elph-Mcnc: 0.8 g/dL (ref 0.4–1.0)
B-Globulin SerPl Elph-Mcnc: 0.8 g/dL (ref 0.7–1.3)
GAMMA GLOB SERPL ELPH-MCNC: 0.6 g/dL (ref 0.4–1.8)
Globulin, Total: 2.5 g/dL (ref 2.2–3.9)
IGG (IMMUNOGLOBIN G), SERUM: 777 mg/dL (ref 700–1600)
IgA/Immunoglobulin A, Serum: 144 mg/dL (ref 61–437)
IgM (Immunoglobulin M), Srm: 20 mg/dL (ref 20–172)
Total Protein: 6.2 g/dL (ref 6.0–8.5)

## 2018-05-09 LAB — ANTI-JO 1 ANTIBODY, IGG

## 2018-05-09 LAB — VITAMIN B12: Vitamin B-12: 698 pg/mL (ref 232–1245)

## 2018-05-09 LAB — ANGIOTENSIN CONVERTING ENZYME: Angio Convert Enzyme: <15 U/L (ref 14–82)

## 2018-05-09 LAB — ALDOLASE: Aldolase: 5 U/L (ref 3.3–10.3)

## 2018-05-09 LAB — CK: Total CK: 195 U/L (ref 24–204)

## 2018-05-11 ENCOUNTER — Telehealth: Payer: Self-pay | Admitting: Diagnostic Neuroimaging

## 2018-05-11 NOTE — Telephone Encounter (Signed)
Pt requesting a call to discuss lab results or send via mychart

## 2018-05-11 NOTE — Telephone Encounter (Signed)
Spoke with patient and informed him his labs are normal. He asked for CK result; gave him the specific result. He then asked about the next step. This RN asked if he has scheduled the MRIs. He stated he has metal in his back, has been trying to get the report from [place] about the metal so MRIs can be scheduled. Starting next week he'll be gone to [facility] for a month, and he'll be gone again for 3 months Jan - April. This RN suggested he get information on metal in his back while he is in [place] and call MRI here to schedule MRIs when he returns in a month.  He agreed to plan, asked about results in my chart. This RN advised lab results will be released to his my chart in 1-2 days. He verbalized understanding, appreciation.

## 2018-05-17 ENCOUNTER — Encounter: Payer: Medicare Other | Admitting: Adult Health

## 2018-05-25 ENCOUNTER — Encounter: Payer: Self-pay | Admitting: Physician Assistant

## 2018-06-19 ENCOUNTER — Ambulatory Visit (INDEPENDENT_AMBULATORY_CARE_PROVIDER_SITE_OTHER): Payer: Medicare Other | Admitting: Physician Assistant

## 2018-06-19 ENCOUNTER — Telehealth: Payer: Self-pay | Admitting: *Deleted

## 2018-06-19 ENCOUNTER — Encounter: Payer: Self-pay | Admitting: Physician Assistant

## 2018-06-19 VITALS — BP 138/60 | HR 60 | Ht 71.0 in | Wt 267.0 lb

## 2018-06-19 DIAGNOSIS — I10 Essential (primary) hypertension: Secondary | ICD-10-CM

## 2018-06-19 DIAGNOSIS — I251 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: Secondary | ICD-10-CM | POA: Diagnosis not present

## 2018-06-19 DIAGNOSIS — E785 Hyperlipidemia, unspecified: Secondary | ICD-10-CM | POA: Diagnosis not present

## 2018-06-19 NOTE — Patient Instructions (Signed)
Medication Instructions: Your physician recommends that you continue on your current medications as directed. Please refer to the Current Medication list given to you today.  If you need a refill on your cardiac medications before your next appointment, please call your pharmacy.   Lab work: NONE If you have labs (blood work) drawn today and your tests are completely normal, you will receive your results only by: Marland Kitchen MyChart Message (if you have MyChart) OR . A paper copy in the mail If you have any lab test that is abnormal or we need to change your treatment, we will call you to review the results.  Testing/Procedures: NONE  Follow-Up: At [facility], you and your health needs are our priority.  As part of our continuing mission to provide you with exceptional heart care, we have created designated Provider Care Teams.  These Care Teams include your primary Cardiologist (physician) and Advanced Practice Providers (APPs -  Physician Assistants and Nurse Practitioners) who all work together to provide you with the care you need, when you need it. You will need a follow up appointment in:  1 years.  Please call our office 2 months in advance to schedule this appointment.  You may see DR. ROSS or one of the following Advanced Practice Providers on your designated Care Team: Richardson Dopp, PA-C [place], [place] . Daune Perch, NP  Any Other Special Instructions Will Be Listed Below (If Applicable).

## 2018-06-19 NOTE — Telephone Encounter (Signed)
Patient stopped by to leave some medical records regarding the metal in his neck.  Said you were waiting on it so we could see about getting his MRI scheduled.  He also had some questions about a muscle biopsy that was to be scheduled. Please call. Would like all this done before the end of the year.

## 2018-06-19 NOTE — Progress Notes (Signed)
Cardiology Office Note:    Date:  06/19/2018   ID:  [name], DOB 14-Mar-1951, MRN [id number]  PCP:  Esaw Grandchild, NP  Cardiologist:  Dorris Carnes, MD  Electrophysiologist:  None   Referring MD: Esaw Grandchild, NP   Chief Complaint  Patient presents with  . Follow-up    for CAD    History of Present Illness:    [name] is a 67 y.o. male with coronary artery disease s/p stent to LAD in 2009 and DES to the RCA in 11/15, HTN, HL, spinal stenosis.  He moved to [place] from [place] in 2014.   He was last seen by Dr. Harrington Challenger in May 2019.    Mr. Muhl returns for follow-up on coronary artery disease.  Since last seen, he has not had any chest discomfort, significant shortness of breath, orthopnea, paroxysmal nocturnal dyspnea, syncope.  He has not had any significant lower extremity swelling.  He has been followed by neurology for weakness of his hands and feet.  He has been having some right leg pain at night.  He denies any exertional symptoms.  Prior CV studies:   The following studies were reviewed today:   - LHC (06/24/14):  pLAD stent ok, pRCA 20-30%, mLAD 20-30%, pRCA 20-30%, mRCA 20-30% followed by 99% before AM, EF 60% >> PCI:  4 x 18 mm Xience Alpine DES  - Echo (5/11):  Mild LVH, EF 55%  - Abdominal US (2/11): No AAA  - Nuclear (11/15):  Inf-lateral and inferior infarct with peri-infarct ischemia, EF 51%; Intermediate to High Risk  - Carotid US (2/11):  Mild a throat sclerotic plaquing  - ABI (2/11): Normal  Past Medical History:  Diagnosis Date  . Coronary artery disease    a. s/p BMS to LAD (1998) b. s/p DES to RCA (06/24/14)  . Dyspnea   . HTN (hypertension)   . Hyperlipidemia   . Myositis    chronic CK elevation ( 415 CK, ALL MM CK subtype, seen by Rheumatology and considered normal Variant)  . Palatal fistula   . Palpable abd. aorta   . RBBB   . Spinal stenosis    Dr Vaughan Sine  surgery  . Weakness    Surgical Hx: The patient  has a past surgical  history that includes Cervical fusion (09/2009); Coronary angioplasty; Lumbar spine surgery (5/13); Coronary stent placement (06/24/2014); left heart catheterization with coronary angiogram (N/A, 06/24/2014); percutaneous coronary stent intervention (pci-s) (06/24/2014); Hydrocele surgery; and Eye surgery.   Current Medications: Current Meds  Medication Sig  . aspirin EC 81 MG EC tablet Take 1 tablet (81 mg total) by mouth daily.  Marland Kitchen atenolol (TENORMIN) 25 MG tablet Take 1 tablet (25 mg total) by mouth 2 (two) times daily.  Marland Kitchen atorvastatin (LIPITOR) 10 MG tablet Take 1 tablet (10 mg total) by mouth daily.  Marland Kitchen b complex vitamins tablet Take 1 tablet by mouth daily.  . cholecalciferol (VITAMIN D) 1000 units tablet Take 1,000 Units by mouth daily.  . Coenzyme Q10 (CO Q 10 PO) Take 1 tablet by mouth daily.   . cyclobenzaprine (FLEXERIL) 10 MG tablet Take 1 tablet (10 mg total) by mouth at bedtime.  Marland Kitchen FOLIC ACID PO Take 1 tablet by mouth daily.  . furosemide (LASIX) 40 MG tablet Take 40 mg by mouth as needed for fluid or edema.   Marland Kitchen ibuprofen (ADVIL,MOTRIN) 800 MG tablet Take 800 mg by mouth as directed.  Marland Kitchen KRILL OIL PO Take 1 tablet  by mouth daily. 1 tab daily  . lisinopril (PRINIVIL,ZESTRIL) 10 MG tablet Take 1 tablet (10 mg total) by mouth daily.  . nitroGLYCERIN (NITROSTAT) 0.4 MG SL tablet Place 1 tablet (0.4 mg total) under the tongue every 5 (five) minutes as needed for chest pain.  Marland Kitchen triamcinolone cream (KENALOG) 0.1 % Apply 1 application topically as directed.     Allergies:   Patient has no known allergies.   Social History   Tobacco Use  . Smoking status: Former Smoker    Packs/day: 0.75    Years: 23.00    Pack years: 17.25    Types: Cigarettes    Last attempt to quit: 07/26/1989    Years since quitting: 28.9  . Smokeless tobacco: Never Used  Substance Use Topics  . Alcohol use: Yes    Comment: 3-6 cans per week  . Drug use: No     Family Hx: The patient's family history  includes Aneurysm in his father; Cancer in his mother; Cystic fibrosis in his brother; Heart attack in his mother; Heart disease in his father. There is no history of Stroke.  ROS:   Please see the history of present illness.    Review of Systems  Eyes: Positive for visual disturbance.  Hematologic/Lymphatic: Bruises/bleeds easily.  Musculoskeletal: Positive for back pain and myalgias.  Neurological: Positive for loss of balance.   All other systems reviewed and are negative.   EKGs/Labs/Other Test Reviewed:    EKG:  EKG is  ordered today.  The ekg ordered today demonstrates normal sinus rhythm, heart rate 60, right bundle branch block, left axis deviation, QTC 454, no change from prior tracing  Recent Labs: 04/20/2018: ALT 14; BUN 23; Creatinine, Ser 0.97; Hemoglobin 12.8; Platelets 129; Potassium 4.9; Sodium 143; TSH 2.110   Recent Lipid Panel Lab Results  Component Value Date/Time   CHOL 136 04/20/2018 08:53 AM   TRIG 69 04/20/2018 08:53 AM   HDL 42 04/20/2018 08:53 AM   CHOLHDL 3.2 04/20/2018 08:53 AM   CHOLHDL 3.3 11/13/2015 11:07 AM   LDLCALC 80 04/20/2018 08:53 AM    Physical Exam:    VS:  BP 138/60   Pulse 60   Ht 5\' 11"  (1.803 m)   Wt 267 lb (121.1 kg)   SpO2 98%   BMI 37.24 kg/m     Wt Readings from Last 3 Encounters:  06/19/18 267 lb (121.1 kg)  04/27/18 274 lb 14.4 oz (124.7 kg)  04/14/18 272 lb 6.4 oz (123.6 kg)     Physical Exam  Constitutional: He is oriented to person, place, and time. He appears well-developed and well-nourished. No distress.  HENT:  Head: Normocephalic and atraumatic.  Eyes: No scleral icterus.  Neck: No thyromegaly present.  Cardiovascular: Normal rate and regular rhythm.  No murmur heard. Pulmonary/Chest: Effort normal. He has no rales.  Abdominal: Soft.  Musculoskeletal: He exhibits edema (very trace bilat LE edema).  Neurological: He is alert and oriented to person, place, and time.  Skin: Skin is warm and dry.     ASSESSMENT & PLAN:    Coronary artery disease involving native coronary artery of native heart without angina pectoris History of stenting to the LAD in 2009 and DES to the RCA in 2015.  He is doing well without symptoms of angina.  Continue aspirin, beta-blocker, atorvastatin.  Essential hypertension  Borderline elevation in his blood pressure today.  However, his blood pressures are usually optimal.  Continue to monitor for now.  Continue current therapy.  Hyperlipidemia, unspecified hyperlipidemia type  Recent LDL 80.  Given his history of CK elevation in the past, I would not adjust his statin dose any further.  He should continue good dietary habits in addition to low-dose atorvastatin.  Dispo:  Return in about 1 year (around 06/20/2019) for Routine Follow Up, w/ Dr. Harrington Challenger, or Richardson Dopp, PA-C.   Medication Adjustments/Labs and Tests Ordered: Current medicines are reviewed at length with the patient today.  Concerns regarding medicines are outlined above.  Tests Ordered: Orders Placed This Encounter  Procedures  . EKG 12-Lead   Medication Changes: No orders of the defined types were placed in this encounter.   Signed, Richardson Dopp, PA-C  06/19/2018 1:01 PM    [facility] [address], [place], [place]  [address] Phone: [phone]; Fax: [phone]

## 2018-06-19 NOTE — Telephone Encounter (Signed)
Received MR on pt for back surgery he had done in past (needed for scheduling MRI).  Also asking about referral to NS for muscle bx.

## 2018-06-27 ENCOUNTER — Telehealth: Payer: Self-pay | Admitting: Diagnostic Neuroimaging

## 2018-06-27 NOTE — Telephone Encounter (Signed)
Called and spoke to Patient and gave him the telephone number to [facility].  I also faxed patient's medical Records from [facility] .  Fax [phone] . Thanks Hinton Dyer .

## 2018-06-28 DIAGNOSIS — G729 Myopathy, unspecified: Secondary | ICD-10-CM | POA: Diagnosis not present

## 2018-06-29 ENCOUNTER — Encounter: Payer: Self-pay | Admitting: Adult Health

## 2018-07-07 ENCOUNTER — Other Ambulatory Visit: Payer: Self-pay | Admitting: Neurological Surgery

## 2018-07-07 NOTE — Pre-Procedure Instructions (Signed)
[name]  07/07/2018      [facility] [address], [place] [address] Phone: [phone] Fax: [phone]  [place] [address] ([address] [place] [address] [place] ([place]) [place] [address] Phone: [phone] Fax: [phone]  [facility] [address] [place] [address] Phone: [phone] Fax: [phone]    Your procedure is scheduled on Tues., Dec. 17, 2019 from 12:53PM-1:48PM  Report to [facility] Admitting Entrance "A" at 10:50AM  Call this number if you have problems the morning of surgery:  [phone]   Remember:  Do not eat or drink after midnight on Dec. 16th    Take these medicines the morning of surgery with A SIP OF WATER: Atenolol (TENORMIN)   As of today, stop taking all Other Aspirin Products, Vitamins, Fish oils, and Herbal medications. Also stop all NSAIDS i.e. Advil, Ibuprofen, Motrin, Aleve, Anaprox, Naproxen, BC, Goody Powders, and all Supplements.    Do not wear jewelry.  Do not wear lotions, powders, colognes, or deodorant.  Do not shave 48 hours prior to surgery.  Men may shave face.  Do not bring valuables to the hospital.  [facility] is not responsible for any belongings or valuables.  Contacts, dentures or bridgework may not be worn into surgery.  Leave your suitcase in the car.  After surgery it may be brought to your room.  For patients admitted to the hospital, discharge time will be determined by your treatment team.  Patients discharged the day of surgery will not be allowed to drive home.   Special instructions:   [facility]- Preparing For Surgery  Before surgery, you can play an important role. Because skin is not sterile, your skin needs to be as free of germs as possible. You can reduce the number of germs on your skin by washing with CHG (chlorahexidine  gluconate) Soap before surgery.  CHG is an antiseptic cleaner which kills germs and bonds with the skin to continue killing germs even after washing.    Oral Hygiene is also important to reduce your risk of infection.  Remember - BRUSH YOUR TEETH THE MORNING OF SURGERY WITH YOUR REGULAR TOOTHPASTE  Please do not use if you have an allergy to CHG or antibacterial soaps. If your skin becomes reddened/irritated stop using the CHG.  Do not shave (including legs and underarms) for at least 48 hours prior to first CHG shower. It is OK to shave your face.  Please follow these instructions carefully.   1. Shower the NIGHT BEFORE SURGERY and the MORNING OF SURGERY with CHG.   2. If you chose to wash your hair, wash your hair first as usual with your normal shampoo.  3. After you shampoo, rinse your hair and body thoroughly to remove the shampoo.  4. Use CHG as you would any other liquid soap. You can apply CHG directly to the skin and wash gently with a scrungie or a clean washcloth.   5. Apply the CHG Soap to your body ONLY FROM THE NECK DOWN.  Do not use on open wounds or open sores. Avoid contact with your eyes, ears, mouth and genitals (private parts). Wash Face and genitals (private parts)  with your normal soap.  6. Wash thoroughly, paying special attention to the area where your surgery will be performed.  7. Thoroughly rinse your  body with warm water from the neck down.  8. DO NOT shower/wash with your normal soap after using and rinsing off the CHG Soap.  9. Pat yourself dry with a CLEAN TOWEL.  10. Wear CLEAN PAJAMAS to bed the night before surgery, wear comfortable clothes the morning of surgery  11. Place CLEAN SHEETS on your bed the night of your first shower and DO NOT SLEEP WITH PETS.  Day of Surgery:  Do not apply any deodorants/lotions.  Please wear clean clothes to the hospital/surgery center.   Remember to brush your teeth WITH YOUR REGULAR TOOTHPASTE.  Please read over  the following fact sheets that you were given. Pain Booklet, Coughing and Deep Breathing and Surgical Site Infection Prevention

## 2018-07-09 ENCOUNTER — Ambulatory Visit
Admission: RE | Admit: 2018-07-09 | Discharge: 2018-07-09 | Disposition: A | Discharge status: Home / Self Care | Payer: Medicare Other | Source: Ambulatory Visit | Attending: Diagnostic Neuroimaging | Admitting: Diagnostic Neuroimaging

## 2018-07-09 DIAGNOSIS — M6281 Muscle weakness (generalized): Secondary | ICD-10-CM | POA: Diagnosis not present

## 2018-07-10 ENCOUNTER — Encounter (HOSPITAL_COMMUNITY)
Admission: RE | Admit: 2018-07-10 | Discharge: 2018-07-10 | Disposition: A | Discharge status: Home / Self Care | Payer: Medicare Other | Source: Ambulatory Visit | Attending: Neurological Surgery | Admitting: Neurological Surgery

## 2018-07-10 ENCOUNTER — Other Ambulatory Visit: Payer: Self-pay

## 2018-07-10 ENCOUNTER — Encounter (HOSPITAL_COMMUNITY): Payer: Self-pay

## 2018-07-10 DIAGNOSIS — I251 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: Secondary | ICD-10-CM | POA: Diagnosis not present

## 2018-07-10 DIAGNOSIS — E785 Hyperlipidemia, unspecified: Secondary | ICD-10-CM | POA: Diagnosis not present

## 2018-07-10 DIAGNOSIS — Z87891 Personal history of nicotine dependence: Secondary | ICD-10-CM | POA: Diagnosis not present

## 2018-07-10 DIAGNOSIS — G729 Myopathy, unspecified: Secondary | ICD-10-CM | POA: Diagnosis not present

## 2018-07-10 DIAGNOSIS — I10 Essential (primary) hypertension: Secondary | ICD-10-CM | POA: Diagnosis not present

## 2018-07-10 DIAGNOSIS — Z79899 Other long term (current) drug therapy: Secondary | ICD-10-CM | POA: Diagnosis not present

## 2018-07-10 DIAGNOSIS — Z01812 Encounter for preprocedural laboratory examination: Secondary | ICD-10-CM | POA: Insufficient documentation

## 2018-07-10 DIAGNOSIS — G629 Polyneuropathy, unspecified: Secondary | ICD-10-CM | POA: Diagnosis not present

## 2018-07-10 DIAGNOSIS — Z955 Presence of coronary angioplasty implant and graft: Secondary | ICD-10-CM | POA: Diagnosis not present

## 2018-07-10 LAB — CBC
HCT: 44.5 % (ref 39.0–52.0)
Hemoglobin: 13.7 g/dL (ref 13.0–17.0)
MCH: 31.3 pg (ref 26.0–34.0)
MCHC: 30.8 g/dL (ref 30.0–36.0)
MCV: 101.6 fL — ABNORMAL HIGH (ref 80.0–100.0)
NRBC: 0 % (ref 0.0–0.2)
Platelets: 137 10*3/uL — ABNORMAL LOW (ref 150–400)
RBC: 4.38 MIL/uL (ref 4.22–5.81)
RDW: 12.6 % (ref 11.5–15.5)
WBC: 7 10*3/uL (ref 4.0–10.5)

## 2018-07-10 LAB — BASIC METABOLIC PANEL
Anion gap: 8 (ref 5–15)
BUN: 18 mg/dL (ref 8–23)
CO2: 28 mmol/L (ref 22–32)
Calcium: 9.2 mg/dL (ref 8.9–10.3)
Chloride: 102 mmol/L (ref 98–111)
Creatinine, Ser: 0.81 mg/dL (ref 0.61–1.24)
GFR calc Af Amer: >60 mL/min (ref >60)
GFR calc non Af Amer: >60 mL/min (ref >60)
Glucose, Bld: 94 mg/dL (ref 70–99)
Potassium: 4.3 mmol/L (ref 3.5–5.1)
Sodium: 138 mmol/L (ref 135–145)

## 2018-07-10 MED ORDER — DEXTROSE 5 % IV SOLN
3.0000 g | INTRAVENOUS | Status: AC
  Administered 2018-07-11: 3 g via INTRAVENOUS
  Filled 2018-07-10: qty 3

## 2018-07-10 NOTE — Anesthesia Preprocedure Evaluation (Addendum)
Anesthesia Evaluation  Patient identified by MRN, date of birth, ID band Patient awake    Reviewed: Allergy & Precautions, NPO status , Patient's Chart, lab work & pertinent test results  History of Anesthesia Complications Negative for: history of anesthetic complications  Airway Mallampati: II  TM Distance: >3 FB Neck ROM: Full    Dental  (+) Dental Advisory Given, Teeth Intact   Pulmonary former smoker,    breath sounds clear to auscultation       Cardiovascular hypertension, Pt. on home beta blockers and Pt. on medications (-) angina+ CAD and + Cardiac Stents  + dysrhythmias  Rhythm:Regular Rate:Normal     Neuro/Psych  Spinal stenosis   Neuromuscular disease negative psych ROS   GI/Hepatic negative GI ROS, Neg liver ROS,   Endo/Other   Obesity   Renal/GU negative Renal ROS     Musculoskeletal  Myositis    Abdominal   Peds  Hematology negative hematology ROS (+)  Thrombocytopenia    Anesthesia Other Findings   Reproductive/Obstetrics                           Anesthesia Physical Anesthesia Plan  ASA: II  Anesthesia Plan: General   Post-op Pain Management:    Induction: Intravenous  PONV Risk Score and Plan: 2 and Treatment may vary due to age or medical condition, Ondansetron and Dexamethasone  Airway Management Planned: LMA  Additional Equipment: None  Intra-op Plan:   Post-operative Plan: Extubation in OR  Informed Consent: I have reviewed the patients History and Physical, chart, labs and discussed the procedure including the risks, benefits and alternatives for the proposed anesthesia with the patient or authorized representative who has indicated his/her understanding and acceptance.   Dental advisory given  Plan Discussed with: CRNA and Anesthesiologist  Anesthesia Plan Comments:       Anesthesia Quick Evaluation

## 2018-07-10 NOTE — Progress Notes (Addendum)
PCP - Mina Marble, NP Cardiologist - Dr. Harrington Challenger  Chest x-ray - n/a EKG - 06/19/2018 Stress Test - 05/2014 ECHO - 09/05/2009 Cardiac Cath - 06/24/2014  Sleep Study - patient denies CPAP - n/a  Fasting Blood Sugar - n/a Checks Blood Sugar _____ times a day  Blood Thinner Instructions: n/a Aspirin Instructions: stopped for 1 week  Anesthesia review: yes, cardiac history  Patient denies shortness of breath, fever, cough and chest pain at PAT appointment   Patient verbalized understanding of instructions that were given to them at the PAT appointment. Patient was also instructed that they will need to review over the PAT instructions again at home before surgery.

## 2018-07-11 ENCOUNTER — Ambulatory Visit (HOSPITAL_COMMUNITY): Payer: Medicare Other | Admitting: Certified Registered Nurse Anesthetist

## 2018-07-11 ENCOUNTER — Ambulatory Visit (HOSPITAL_COMMUNITY): Payer: Medicare Other | Admitting: Physician Assistant

## 2018-07-11 ENCOUNTER — Encounter (HOSPITAL_COMMUNITY)
Admission: RE | Disposition: A | Discharge status: Home / Self Care | Payer: Self-pay | Source: Home / Self Care | Attending: Neurological Surgery

## 2018-07-11 ENCOUNTER — Ambulatory Visit (HOSPITAL_COMMUNITY)
Admission: RE | Admit: 2018-07-11 | Discharge: 2018-07-11 | Disposition: A | Discharge status: Home / Self Care | Payer: Medicare Other | Attending: Neurological Surgery | Admitting: Neurological Surgery

## 2018-07-11 DIAGNOSIS — G729 Myopathy, unspecified: Secondary | ICD-10-CM | POA: Diagnosis not present

## 2018-07-11 DIAGNOSIS — Z87891 Personal history of nicotine dependence: Secondary | ICD-10-CM | POA: Insufficient documentation

## 2018-07-11 DIAGNOSIS — I25119 Atherosclerotic heart disease of native coronary artery with unspecified angina pectoris: Secondary | ICD-10-CM | POA: Diagnosis not present

## 2018-07-11 DIAGNOSIS — Z955 Presence of coronary angioplasty implant and graft: Secondary | ICD-10-CM | POA: Diagnosis not present

## 2018-07-11 DIAGNOSIS — G629 Polyneuropathy, unspecified: Secondary | ICD-10-CM | POA: Insufficient documentation

## 2018-07-11 DIAGNOSIS — E785 Hyperlipidemia, unspecified: Secondary | ICD-10-CM | POA: Diagnosis not present

## 2018-07-11 DIAGNOSIS — I10 Essential (primary) hypertension: Secondary | ICD-10-CM | POA: Insufficient documentation

## 2018-07-11 DIAGNOSIS — I251 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: Secondary | ICD-10-CM | POA: Insufficient documentation

## 2018-07-11 DIAGNOSIS — Z79899 Other long term (current) drug therapy: Secondary | ICD-10-CM | POA: Diagnosis not present

## 2018-07-11 HISTORY — PX: MUSCLE BIOPSY: SHX716

## 2018-07-11 SURGERY — BIOPSY, MUSCLE, GASTROCNEMIUS
Anesthesia: General | Site: Leg Upper | Laterality: Left

## 2018-07-11 MED ORDER — GLYCOPYRROLATE PF 0.2 MG/ML IJ SOSY
PREFILLED_SYRINGE | INTRAMUSCULAR | Status: AC
  Filled 2018-07-11: qty 1

## 2018-07-11 MED ORDER — CHLORHEXIDINE GLUCONATE CLOTH 2 % EX PADS: 6.0000 | MEDICATED_PAD | Freq: Once | CUTANEOUS | Status: DC

## 2018-07-11 MED ORDER — PHENYLEPHRINE HCL 10 MG/ML IJ SOLN
INTRAMUSCULAR | Status: DC | PRN
  Administered 2018-07-11: 40 ug via INTRAVENOUS

## 2018-07-11 MED ORDER — EPHEDRINE 5 MG/ML INJ
INTRAVENOUS | Status: AC
  Filled 2018-07-11: qty 10

## 2018-07-11 MED ORDER — PHENYLEPHRINE 40 MCG/ML (10ML) SYRINGE FOR IV PUSH (FOR BLOOD PRESSURE SUPPORT)
PREFILLED_SYRINGE | INTRAVENOUS | Status: AC
  Filled 2018-07-11: qty 10

## 2018-07-11 MED ORDER — MIDAZOLAM HCL 2 MG/2ML IJ SOLN
INTRAMUSCULAR | Status: AC
  Filled 2018-07-11: qty 2

## 2018-07-11 MED ORDER — 0.9 % SODIUM CHLORIDE (POUR BTL) OPTIME
TOPICAL | Status: DC | PRN
  Administered 2018-07-11: 1000 mL

## 2018-07-11 MED ORDER — DEXAMETHASONE SODIUM PHOSPHATE 10 MG/ML IJ SOLN
INTRAMUSCULAR | Status: AC
  Filled 2018-07-11: qty 1

## 2018-07-11 MED ORDER — PROPOFOL 10 MG/ML IV BOLUS
INTRAVENOUS | Status: DC | PRN
  Administered 2018-07-11: 150 mg via INTRAVENOUS

## 2018-07-11 MED ORDER — EPHEDRINE SULFATE 50 MG/ML IJ SOLN
INTRAMUSCULAR | Status: DC | PRN
  Administered 2018-07-11 (×2): 5 mg via INTRAVENOUS

## 2018-07-11 MED ORDER — OXYCODONE HCL 5 MG/5ML PO SOLN: 5.0000 mg | Freq: Once | ORAL | Status: DC | PRN

## 2018-07-11 MED ORDER — HYDROCODONE-ACETAMINOPHEN 5-325 MG PO TABS
ORAL_TABLET | ORAL | Status: AC
  Filled 2018-07-11: qty 1

## 2018-07-11 MED ORDER — ONDANSETRON HCL 4 MG/2ML IJ SOLN: 4.0000 mg | Freq: Once | INTRAMUSCULAR | Status: DC | PRN

## 2018-07-11 MED ORDER — DEXAMETHASONE SODIUM PHOSPHATE 10 MG/ML IJ SOLN
INTRAMUSCULAR | Status: DC | PRN
  Administered 2018-07-11: 10 mg via INTRAVENOUS

## 2018-07-11 MED ORDER — FENTANYL CITRATE (PF) 250 MCG/5ML IJ SOLN
INTRAMUSCULAR | Status: DC | PRN
  Administered 2018-07-11 (×4): 25 ug via INTRAVENOUS

## 2018-07-11 MED ORDER — FENTANYL CITRATE (PF) 250 MCG/5ML IJ SOLN
INTRAMUSCULAR | Status: AC
  Filled 2018-07-11: qty 5

## 2018-07-11 MED ORDER — PROPOFOL 10 MG/ML IV BOLUS
INTRAVENOUS | Status: AC
  Filled 2018-07-11: qty 20

## 2018-07-11 MED ORDER — LIDOCAINE HCL (CARDIAC) PF 100 MG/5ML IV SOSY
PREFILLED_SYRINGE | INTRAVENOUS | Status: DC | PRN
  Administered 2018-07-11: 80 mg via INTRATRACHEAL

## 2018-07-11 MED ORDER — BUPIVACAINE HCL (PF) 0.5 % IJ SOLN
INTRAMUSCULAR | Status: AC
  Filled 2018-07-11: qty 30

## 2018-07-11 MED ORDER — ONDANSETRON HCL 4 MG/2ML IJ SOLN
INTRAMUSCULAR | Status: DC | PRN
  Administered 2018-07-11: 4 mg via INTRAVENOUS

## 2018-07-11 MED ORDER — LACTATED RINGERS IV SOLN
INTRAVENOUS | Status: DC
  Administered 2018-07-11 (×2): via INTRAVENOUS

## 2018-07-11 MED ORDER — HYDROCODONE-ACETAMINOPHEN 5-325 MG PO TABS: 1.0000 | ORAL_TABLET | ORAL | 0 refills | Status: DC | PRN

## 2018-07-11 MED ORDER — LIDOCAINE-EPINEPHRINE 1 %-1:100000 IJ SOLN
INTRAMUSCULAR | Status: AC
  Filled 2018-07-11: qty 1

## 2018-07-11 MED ORDER — OXYCODONE HCL 5 MG PO TABS: 5.0000 mg | ORAL_TABLET | Freq: Once | ORAL | Status: DC | PRN

## 2018-07-11 MED ORDER — BUPIVACAINE LIPOSOME 1.3 % IJ SUSP
20.0000 mL | INTRAMUSCULAR | Status: AC
  Administered 2018-07-11: 20 mL
  Filled 2018-07-11: qty 20

## 2018-07-11 MED ORDER — FENTANYL CITRATE (PF) 100 MCG/2ML IJ SOLN: 25.0000 ug | INTRAMUSCULAR | Status: DC | PRN

## 2018-07-11 MED ORDER — ONDANSETRON HCL 4 MG/2ML IJ SOLN
INTRAMUSCULAR | Status: AC
  Filled 2018-07-11: qty 2

## 2018-07-11 MED ORDER — HYDROCODONE-ACETAMINOPHEN 5-325 MG PO TABS
1.0000 | ORAL_TABLET | Freq: Once | ORAL | Status: AC
  Administered 2018-07-11: 1 via ORAL

## 2018-07-11 SURGICAL SUPPLY — 58 items
BANDAGE ACE 4X5 VEL STRL LF (GAUZE/BANDAGES/DRESSINGS) IMPLANT
BANDAGE ACE 6X5 VEL STRL LF (GAUZE/BANDAGES/DRESSINGS) IMPLANT
BENZOIN TINCTURE PRP APPL 2/3 (GAUZE/BANDAGES/DRESSINGS) ×2 IMPLANT
BLADE CLIPPER SURG (BLADE) IMPLANT
BLADE SURG 15 STRL LF DISP TIS (BLADE) ×1 IMPLANT
BLADE SURG 15 STRL SS (BLADE) ×1
BNDG GAUZE ELAST 4 BULKY (GAUZE/BANDAGES/DRESSINGS) ×2 IMPLANT
CABLE BIPOLOR RESECTION CORD (MISCELLANEOUS) ×2 IMPLANT
CONT SPEC 4OZ CLIKSEAL STRL BL (MISCELLANEOUS) ×2 IMPLANT
COVER WAND RF STERILE (DRAPES) ×2 IMPLANT
DECANTER SPIKE VIAL GLASS SM (MISCELLANEOUS) ×2 IMPLANT
DERMABOND ADVANCED (GAUZE/BANDAGES/DRESSINGS) ×1
DERMABOND ADVANCED .7 DNX12 (GAUZE/BANDAGES/DRESSINGS) ×1 IMPLANT
DRAPE HALF SHEET 40X57 (DRAPES) ×2 IMPLANT
DRAPE INCISE IOBAN 66X45 STRL (DRAPES) ×2 IMPLANT
DRAPE LAPAROTOMY 100X72 PEDS (DRAPES) IMPLANT
DRSG TELFA 3X8 NADH (GAUZE/BANDAGES/DRESSINGS) ×2 IMPLANT
DURAPREP 26ML APPLICATOR (WOUND CARE) ×2 IMPLANT
GAUZE 4X4 16PLY RFD (DISPOSABLE) ×4 IMPLANT
GLOVE BIO SURGEON STRL SZ7.5 (GLOVE) IMPLANT
GLOVE BIOGEL M STRL SZ7.5 (GLOVE) ×2 IMPLANT
GLOVE BIOGEL PI IND STRL 6.5 (GLOVE) ×2 IMPLANT
GLOVE BIOGEL PI IND STRL 7.0 (GLOVE) ×1 IMPLANT
GLOVE BIOGEL PI IND STRL 7.5 (GLOVE) ×1 IMPLANT
GLOVE BIOGEL PI IND STRL 8.5 (GLOVE) ×1 IMPLANT
GLOVE BIOGEL PI INDICATOR 6.5 (GLOVE) ×2
GLOVE BIOGEL PI INDICATOR 7.0 (GLOVE) ×1
GLOVE BIOGEL PI INDICATOR 7.5 (GLOVE) ×1
GLOVE BIOGEL PI INDICATOR 8.5 (GLOVE) ×1
GLOVE EXAM NITRILE XL STR (GLOVE) IMPLANT
GLOVE INDICATOR 7.5 STRL GRN (GLOVE) IMPLANT
GOWN STRL REUS W/ TWL LRG LVL3 (GOWN DISPOSABLE) ×2 IMPLANT
GOWN STRL REUS W/ TWL XL LVL3 (GOWN DISPOSABLE) IMPLANT
GOWN STRL REUS W/TWL 2XL LVL3 (GOWN DISPOSABLE) IMPLANT
GOWN STRL REUS W/TWL LRG LVL3 (GOWN DISPOSABLE) ×2
GOWN STRL REUS W/TWL XL LVL3 (GOWN DISPOSABLE)
KIT BASIN OR (CUSTOM PROCEDURE TRAY) ×2 IMPLANT
KIT TURNOVER KIT B (KITS) ×2 IMPLANT
MARKER SKIN DUAL TIP RULER LAB (MISCELLANEOUS) ×2 IMPLANT
NEEDLE HYPO 25X1 1.5 SAFETY (NEEDLE) ×2 IMPLANT
NS IRRIG 1000ML POUR BTL (IV SOLUTION) ×2 IMPLANT
PACK SURGICAL SETUP 50X90 (CUSTOM PROCEDURE TRAY) ×2 IMPLANT
PAD ARMBOARD 7.5X6 YLW CONV (MISCELLANEOUS) ×2 IMPLANT
SPECIMEN JAR SMALL (MISCELLANEOUS) ×2 IMPLANT
STRIP CLOSURE SKIN 1/2X4 (GAUZE/BANDAGES/DRESSINGS) ×2 IMPLANT
SUT MNCRL AB 3-0 PS2 18 (SUTURE) ×2 IMPLANT
SUT SILK 0 TIES 10X30 (SUTURE) IMPLANT
SUT SILK 2 0 TIES 10X30 (SUTURE) ×2 IMPLANT
SUT SILK 3 0 TIES 17X18 (SUTURE)
SUT SILK 3-0 18XBRD TIE BLK (SUTURE) IMPLANT
SUT VIC AB 2-0 CT1 27 (SUTURE) ×1
SUT VIC AB 2-0 CT1 27XBRD (SUTURE) ×1 IMPLANT
SUT VIC AB 3-0 SH 8-18 (SUTURE) ×2 IMPLANT
SYR BULB 3OZ (MISCELLANEOUS) ×2 IMPLANT
SYR CONTROL 10ML LL (SYRINGE) ×2 IMPLANT
TOWEL GREEN STERILE (TOWEL DISPOSABLE) ×2 IMPLANT
TOWEL GREEN STERILE FF (TOWEL DISPOSABLE) ×2 IMPLANT
WATER STERILE IRR 1000ML POUR (IV SOLUTION) ×2 IMPLANT

## 2018-07-11 NOTE — H&P (Signed)
Surgical H&P Update  HPI: 67 y.o. 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 Initial symptoms consisted of progressive weakness, prior cervical and lumbar surgeries did not resolve progressive weakness. EMG showed myopathic changes as well as polyneuropathy. No changes in health since he was last seen. Still having weakness and wishes to proceed with surgery.  PMHx:  Past Medical History:  Diagnosis Date  . Coronary artery disease    a. s/p BMS to LAD (1998) b. s/p DES to RCA (06/24/14)  . Dyspnea   . HTN (hypertension)   . Hyperlipidemia   . Myositis    chronic CK elevation ( 415 CK, ALL MM CK subtype, seen by Rheumatology and considered normal Variant)  . Palatal fistula   . Palpable abd. aorta   . Pneumonia   . RBBB   . Spinal stenosis    Dr Vaughan Sine  surgery  . Weakness    FamHx:  Family History  Problem Relation Age of Onset  . Heart attack Mother   . Cancer Mother        esophageal  . Aneurysm Father   . Heart disease Father   . 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 Stroke Neg Hx    SocHx:  reports that he quit smoking about 28 years ago. His smoking use included cigarettes. He has a 17.25 pack-year smoking history. He has never used smokeless tobacco. He reports current alcohol use. He reports that he does not use drugs.  Physical Exam: AOx3, PERRL, FS, TM  Strength diffusely decreased, stocking and glove numbness  Assesment/Plan: 66 y.o. man with myopathy and polyneuropathy, here for left vastus lateralis muscle biopsy. Risks, benefits, and alternatives discussed and the patient would like to continue with surgery. -OR today then discharge home this afternoon after recovered from [place], MD 07/11/18 11:41 AM

## 2018-07-11 NOTE — Discharge Instructions (Addendum)
Discharge Instructions  No restriction in activities, slowly increase your activity back to normal.   Your incision is closed with dermabond (purple glue). This will naturally fall off over the next 1-2 weeks.   Okay to shower on the day after surgery. Use regular soap and water and try to be gentle when cleaning your incision.   Follow up with Dr. Zada Finders in 2 weeks after discharge. If you do not already have a discharge appointment, please call his office at [phone] to schedule a follow up appointment. If you have any concerns or questions, please call the office and let us know. If you will be out of town for the winter before your two week follow up visit, please call the office and let us know how you are doing.

## 2018-07-11 NOTE — Brief Op Note (Signed)
07/11/2018  2:53 PM  PATIENT:  [name]  67 y.o. male  PRE-OPERATIVE DIAGNOSIS:  Myopathy, polyneuropathy  POST-OPERATIVE DIAGNOSIS:  Myopathy, polyneuropathy  PROCEDURE:  Procedure(s) with comments: Left Vastus lateralus muscle biopsy (Left) - Left Vastus lateralus muscle biopsy  SURGEON:  Surgeon(s) and Role:    * Judith Part, MD - Primary  PHYSICIAN ASSISTANT:   ASSISTANTS: none   ANESTHESIA:   general  EBL:  15 mL   BLOOD ADMINISTERED:none  DRAINS: none   LOCAL MEDICATIONS USED:  Bupivicaine used after biopsy was taken  SPECIMEN:  Source of Specimen:  L vastus muscle fascicles x2  DISPOSITION OF SPECIMEN:  PATHOLOGY  COUNTS:  YES  TOURNIQUET:  * No tourniquets in log *  DICTATION: .Note written in EPIC  PLAN OF CARE: Discharge to home after PACU  PATIENT DISPOSITION:  PACU - hemodynamically stable.   Delay start of Pharmacological VTE agent (>24hrs) due to surgical blood loss or risk of bleeding: yes

## 2018-07-11 NOTE — Transfer of Care (Signed)
Immediate Anesthesia Transfer of Care Note  Patient: [name]  Procedure(s) Performed: Left Vastus lateralus muscle biopsy (Left Leg Upper)  Patient Location: PACU  Anesthesia Type:General  Level of Consciousness: awake, alert  and patient cooperative  Airway & Oxygen Therapy: Patient Spontanous Breathing and Patient connected to face mask oxygen  Post-op Assessment: Report given to RN and Post -op Vital signs reviewed and stable  Post vital signs: Reviewed and stable  Last Vitals:  Vitals Value Taken Time  BP 147/67 07/11/2018  2:57 PM  Temp    Pulse 77 07/11/2018  2:58 PM  Resp    SpO2 100 % 07/11/2018  2:58 PM  Vitals shown include unvalidated device data.  Last Pain:  Vitals:   07/11/18 1115  TempSrc: Oral         Complications: No apparent anesthesia complications

## 2018-07-11 NOTE — Op Note (Signed)
PATIENT: [name]  DAY OF SURGERY: 07/11/18   PRE-OPERATIVE DIAGNOSIS:  Myopathy, polyneuropathy   POST-OPERATIVE DIAGNOSIS:  Myopathy, polyneuropathy   PROCEDURE:  Left vastus lateralis muscle biopsy   SURGEON:  Surgeon(s) and Role:    Judith Part, MD - Primary  ANESTHESIA: ETGA   BRIEF HISTORY: This is a 67yo man who presented with progressive weakness and was found to have myopathy with polyneuropathy without a known diagnosis, prompting the need for a muscle biopsy. This was discussed with the patient as well as risks, benefits, and alternatives and wished to proceed with surgery.   OPERATIVE DETAIL: The patient was taken to the operating room and placed on the OR table in the supine position. A formal time out was performed with two patient identifiers and confirmed the operative site. Anesthesia was induced by the anesthesia team. The operative site was marked, hair was clipped with surgical clippers, the area was then prepped and draped in a sterile fashion. A linear incision was placed on the left lateral thigh. Dissection was performed through subcutaneous fat and the vastus fascia was incised sharply. The muscle belly was identified, which was notable for significant fatty infiltration. Obvious fascicles were dissected free and silk sutures were placed at both ends then the fascicles were amputated en bloc. This was repeated for a second time to allow for two samples, given how severe the fatty infiltration was. Hemostasis was obtained, the fascia was closed, all instrument and sponge counts were correct, the incision was then closed in layers. The patient was then returned to anesthesia for emergence. No apparent complications at the completion of the procedure.   EBL:  18mL   DRAINS: none   SPECIMENS: vastus lateralis muscle biopsy x2   Judith Part, MD 07/11/18 2:54 PM

## 2018-07-11 NOTE — Anesthesia Postprocedure Evaluation (Signed)
Anesthesia Post Note  Patient: [name]  Procedure(s) Performed: Left Vastus lateralus muscle biopsy (Left Leg Upper)     Patient location during evaluation: PACU Anesthesia Type: General Level of consciousness: awake and alert Pain management: pain level controlled Vital Signs Assessment: post-procedure vital signs reviewed and stable Respiratory status: spontaneous breathing, nonlabored ventilation and respiratory function stable Cardiovascular status: blood pressure returned to baseline and stable Postop Assessment: no apparent nausea or vomiting Anesthetic complications: no    Last Vitals:  Vitals:   07/11/18 1510 07/11/18 1525  BP: (!) 144/78 133/76  Pulse: 73 73  Resp: 16 17  Temp:  (!) 36.4 C  SpO2: 96% 100%    Last Pain:  Vitals:   07/11/18 1518  TempSrc:   PainSc: [place]

## 2018-07-11 NOTE — Anesthesia Procedure Notes (Signed)
Procedure Name: LMA Insertion Date/Time: 07/11/2018 2:05 PM Performed by: Kathryne Hitch, CRNA Pre-anesthesia Checklist: Patient identified, Emergency Drugs available, Suction available, Patient being monitored and Timeout performed Patient Re-evaluated:Patient Re-evaluated prior to induction Oxygen Delivery Method: Circle system utilized Preoxygenation: Pre-oxygenation with 100% oxygen Induction Type: IV induction Ventilation: Mask ventilation without difficulty LMA: LMA flexible inserted LMA Size: 5.0 Tube secured with: Tape Comments: Gerald Stabs, CRNA placed LMA

## 2018-07-12 ENCOUNTER — Encounter (HOSPITAL_COMMUNITY): Payer: Self-pay | Admitting: Neurological Surgery

## 2018-07-14 ENCOUNTER — Telehealth: Payer: Self-pay | Admitting: *Deleted

## 2018-07-14 NOTE — Telephone Encounter (Signed)
Spoke with pateint and informed him his MRI lumbar spine showed multi-level degenerative changes and mild spinal stenosis. Mod-severe foraminal stenosis. Advised him Dr Leta Baptist will follow up muscle biopsy results first and hold off on spine surgery evaluation. Dr Leta Baptist will continue current plan. Advised him the MRI cervical spine showed mild degenerative changes and Dr Leta Baptist recommends conservative management for now. The patient stated he had biopsy on 12/17 and will be going to [place] for 3 months on  Dec 30th.  He does have my chart access, so this RN advised he can call or reach [place] by my chart. He asked about a medication if he Is diagnosed with myositis. This RN advised Dr Leta Baptist needs biopsy results. He verbalized understanding, appreciation.

## 2018-07-17 NOTE — Progress Notes (Signed)
Subjective:    Patient ID: [name], adult    DOB: [birth year], 67 y.o.   MRN: [id number]  HPI:  02/15/18 OV: [name] is here to establish as a new pt.  He is a pleasant 67 year old male. PMH:  HTN, RBBB, CAD, Angina, HLD, Spinal stenosis He has cardiac stent placed-1998, 2017, has been off anticoagulants for >18 months Failed cervical fusion 2011 Lumbar Fusions- 2012, 2013 He has sig extremity weakness and poor muscle tone-all worsening since 2011 He recently flew to [place] at end of June and since then has had R thigh pain/spasm He recently started [employer] and has lost >14 lbs, goal is to loss 40 lbs He estimates to drink 40-50 oz water/day He enjoys a 1-2 beers 3-4 times/week He denies tobacco use He has not seen Neurology since 2013- in [place]  He is followed by Cards/DR. 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 Will proceed with lab testing and referral for muscle biopsy (LEFT VASTUS LATERALIS).  He is currently awaiting results of L vastus laterlis bx completed 07/11/18  He was seen by cards 06/19/18: Essential hypertension  Borderline elevation in his blood pressure today.  However, his blood pressures are usually optimal.  Continue to monitor for now.  Continue current therapy. Hyperlipidemia, unspecified hyperlipidemia type  Recent LDL 80.  Given his history of CK elevation in the past, I would not adjust his statin dose any further.  He should continue good dietary habits in addition to low-dose atorvastatin. He reports medication compliance, denies SE He reports using moderate-large amounts of salt on food daily. He remains as active as possible- house/yard work.  His pool is closed for the season, unable to swim/perform water aerobics He has maintained weight loss with  portion control- current wt 266 He continues to abstain from tobacco/vape use He will be leaving for [place] 07/24/18 for 3 months Encouraged pt to use MyChart to communicate with providers while he is out of state  07/10/18 [facility] Patient Care Team    Relationship Specialty Notifications Start End  Mina Marble D, NP PCP - General Family Medicine  02/20/18   [name], MD PCP - Cardiology Cardiology Admissions 06/19/18   [name], MD Consulting Physician Cardiology  02/15/18   Harriett Sine, MD Consulting Physician Dermatology  02/15/18     Patient Active Problem List   Diagnosis Date Noted  . BMI 37.0-37.9, adult 07/20/2018  . Encounter for Medicare annual wellness exam 04/27/2018  . Colon cancer screening 04/27/2018  . Flu vaccine need 04/27/2018  . Healthcare maintenance 02/15/2018  . Stented coronary artery   . Myositis   . Hyperlipidemia   . Coronary artery disease   . Spinal stenosis   . Weakness   . HTN (hypertension)      Past Medical History:  Diagnosis Date  . Coronary artery disease    a. s/p BMS to LAD (1998) b. s/p DES to RCA (06/24/14)  . Dyspnea   . HTN (hypertension)   . Hyperlipidemia   . Myositis    chronic CK elevation ( 415 CK, ALL MM CK subtype, seen by Rheumatology and considered normal Variant)  . Palatal fistula   . Palpable abd. aorta   . Pneumonia   . RBBB   . Spinal stenosis    Dr Brita Romp  [facility]  . Weakness      Past Surgical History:  Procedure Laterality Date  . CERVICAL FUSION  09/2009  . CORONARY ANGIOPLASTY    . CORONARY STENT PLACEMENT  06/24/2014   DES to RCA  . EYE SURGERY     Lasik  . HYDROCELE EXCISION    . LEFT HEART CATHETERIZATION WITH CORONARY ANGIOGRAM N/A 06/24/2014   Procedure: LEFT HEART CATHETERIZATION WITH CORONARY ANGIOGRAM;  Surgeon: Troy Sine, MD;  Location: [facility] CATH LAB;  Service: Cardiovascular;  Laterality: N/A;  . LUMBAR SPINE SURGERY  5/13  . MUSCLE BIOPSY Left 07/11/2018    Procedure: Left Vastus lateralus muscle biopsy;  Surgeon: Judith Part, MD;  Location: [place];  Service: Neurosurgery;  Laterality: Left;  Left Vastus lateralus muscle biopsy  . PERCUTANEOUS CORONARY STENT INTERVENTION (PCI-S)  06/24/2014   Procedure: PERCUTANEOUS CORONARY STENT INTERVENTION (PCI-S);  Surgeon: Troy Sine, MD;  Location: [facility] CATH LAB;  Service: Cardiovascular;;  Distal RCA     Family History  Problem Relation Age of Onset  . Heart attack Mother   . Cancer Mother        esophageal  . Aneurysm Father   . Heart disease Father   . 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 Stroke Neg Hx      Social History   Substance and Sexual Activity  Drug Use No     Social History   Substance and Sexual Activity  Alcohol Use Yes   Comment: 3-6 cans per week     Social History   Tobacco Use  Smoking Status Former Smoker  . Packs/day: 0.75  . Years: 23.00  . Pack years: 17.25  . Types: Cigarettes  . Last attempt to quit: 07/26/1989  . Years since quitting: 29.0  Smokeless Tobacco Never Used     Outpatient Encounter Medications as of 07/20/2018  Medication Sig  . aspirin EC 81 MG EC tablet Take 1 tablet (81 mg total) by mouth daily.  Marland Kitchen atenolol (TENORMIN) 25 MG tablet Take 1 tablet (25 mg total) by mouth 2 (two) times daily.  Marland Kitchen atorvastatin (LIPITOR) 10 MG tablet Take 1 tablet (10 mg total) by mouth daily.  Marland Kitchen b complex vitamins tablet Take 1 tablet by mouth daily.  . betamethasone dipropionate (DIPROLENE) 0.05 % cream Apply 1 application topically every other day.  . cholecalciferol (VITAMIN D) 1000 units tablet Take 1,000 Units by mouth daily.  . Coenzyme Q10 (CO Q 10) 100 MG CAPS Take 100 mg by mouth daily.   . cyclobenzaprine (FLEXERIL) 10 MG tablet Take 1 tablet (10 mg total) by mouth at bedtime.  . diphenhydrAMINE (BENADRYL) 25 MG tablet Take 50 mg by mouth at bedtime.  . folic acid  (FOLVITE) 469 MCG tablet Take 800 mcg by mouth daily.   . furosemide (LASIX) 40 MG tablet Take 40 mg by mouth daily as needed for fluid or edema.   Marland Kitchen glucosamine-chondroitin 500-400 MG tablet Take 1 tablet by mouth daily.  Marland Kitchen HYDROcodone-acetaminophen (NORCO/VICODIN) 5-325 MG tablet Take 1 tablet by mouth every 4 (four) hours as needed for moderate pain.  Marland Kitchen ibuprofen (ADVIL,MOTRIN) 800 MG tablet Take 800 mg by mouth daily.   Javier Docker Oil 500 MG CAPS Take 500 mg by mouth daily.   Marland Kitchen lisinopril (PRINIVIL,ZESTRIL) 10 MG tablet Take 1 tablet (10 mg total) by mouth daily.  Marland Kitchen  Melatonin 10 MG TABS Take 20 mg by mouth at bedtime.  . nitroGLYCERIN (NITROSTAT) 0.4 MG SL tablet Place 1 tablet (0.4 mg total) under the tongue every 5 (five) minutes as needed for chest pain.  . Saw Palmetto, Serenoa repens, (SAW PALMETTO PO) Take 300 mg by mouth daily.  Marland Kitchen triamcinolone cream (KENALOG) 0.1 % Apply 1 application topically as directed. (Patient taking differently: Apply 1 application topically every other day. )   No facility-administered encounter medications on file as of 07/20/2018.     Allergies: Patient has no known allergies.  Body mass index is 37.17 kg/m.  Blood pressure 128/77, pulse 60, temperature 98.5 F (36.9 C), temperature source Oral, height 5\' 11"  (1.803 m), weight 266 lb 8 oz (120.9 kg), SpO2 98 %.  Review of Systems  Constitutional: Positive for activity change and fatigue. Negative for appetite change, chills, diaphoresis, fever and unexpected weight change.  Eyes: Negative for visual disturbance.  Respiratory: Negative for cough, chest tightness, shortness of breath, wheezing and stridor.   Cardiovascular: Positive for leg swelling. Negative for chest pain and palpitations.  Gastrointestinal: Negative for abdominal distention and abdominal pain.  Genitourinary: Negative for difficulty urinating and flank pain.  Musculoskeletal: Positive for arthralgias, back pain, gait problem, joint  swelling, myalgias, neck pain and neck stiffness.  Skin: Positive for wound. Negative for color change, pallor and rash.  Neurological: Positive for weakness. Negative for dizziness and headaches.  Hematological: Does not bruise/bleed easily.  Psychiatric/Behavioral: Negative for confusion, decreased concentration, dysphoric mood, hallucinations, self-injury, sleep disturbance and suicidal ideas. The patient is not nervous/anxious and is not hyperactive.        Objective:   Physical Exam Vitals signs reviewed.  Constitutional:      General: [name] Maryland "Mikki Santee" is not in acute distress.    Appearance: [name] Maryland "Mikki Santee" is well-developed. [name] Maryland "Mikki Santee" is not diaphoretic.  HENT:     Head: Normocephalic and atraumatic.     Right Ear: External ear normal.     Left Ear: External ear normal.     Nose: Nose normal.  Eyes:     Conjunctiva/sclera: Conjunctivae normal.     Pupils: Pupils are equal, round, and reactive to light.  Cardiovascular:     Rate and Rhythm: Normal rate and regular rhythm.     Heart sounds: Normal heart sounds. No murmur.  Pulmonary:     Effort: Pulmonary effort is normal. No respiratory distress.     Breath sounds: Normal breath sounds. No stridor. No wheezing or rales.  Chest:     Chest wall: No tenderness.  Musculoskeletal: Normal range of motion.        General: Tenderness present.     Right lower leg: [name] Maryland "Mikki Santee" exhibits swelling.     Left lower leg: [name] Maryland "Mikki Santee" exhibits swelling.     Right foot: Swelling present.     Left foot: Swelling present.  Skin:    General: Skin is warm and dry.     Capillary Refill: Capillary refill takes less than 2 seconds.     Coloration: Skin is not pale.     Findings: No erythema or rash.  Neurological:     Mental Status: [name] Bihm "Mikki Santee" is alert and oriented to person, place, and time.     Motor: Abnormal muscle tone present.  Psychiatric:        Behavior: Behavior normal.        Thought Content: Thought  content normal.  Judgment: Judgment normal.       Assessment & Plan:   1. Healthcare maintenance   2. Essential hypertension   3. BMI 37.0-37.9, adult   4. Hyperlipidemia, unspecified hyperlipidemia type   5. Myositis of left lower extremity, unspecified myositis type     Healthcare maintenance Please reduce salt intake in diet and continue to remain as active as possible. Increase plain water intake, strive for at least 68 oz/day. Please follow-up in 3 months for physical. Please ensure that your MyChart is active so you can communicate with your providers while you travel.  HTN (hypertension) BP at goal 128/77, HR 60 Continue Atenolol 25mg  BID, Lisinopril 10mg  QD Follow DASH diet, remain as active as possible   BMI 37.0-37.9, adult Body mass index is 37.17 kg/m.  Current wt 266 DASH diet, remain as active as possible   Hyperlipidemia Per cards- Hyperlipidemia, unspecified hyperlipidemia type  Recent LDL 80.  Given his history of CK elevation in the past, I would not adjust his statin dose any further.  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 Will proceed with lab testing and referral for muscle biopsy (LEFT VASTUS LATERALIS).  He is currently awaiting results of L vastus laterlis bx completed 07/11/18    FOLLOW-UP:  Return in about 3 months (around 10/19/2018) for CPE.

## 2018-07-20 ENCOUNTER — Ambulatory Visit (INDEPENDENT_AMBULATORY_CARE_PROVIDER_SITE_OTHER): Payer: Medicare Other | Admitting: Adult Health

## 2018-07-20 ENCOUNTER — Encounter: Payer: Self-pay | Admitting: Adult Health

## 2018-07-20 VITALS — BP 128/77 | HR 60 | Temp 98.5°F | Ht 71.0 in | Wt 266.5 lb

## 2018-07-20 DIAGNOSIS — Z0000 Encounter for general adult medical examination without abnormal findings: Secondary | ICD-10-CM | POA: Diagnosis not present

## 2018-07-20 DIAGNOSIS — E785 Hyperlipidemia, unspecified: Secondary | ICD-10-CM

## 2018-07-20 DIAGNOSIS — I251 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: Secondary | ICD-10-CM | POA: Diagnosis not present

## 2018-07-20 DIAGNOSIS — I10 Essential (primary) hypertension: Secondary | ICD-10-CM | POA: Diagnosis not present

## 2018-07-20 DIAGNOSIS — M60862 Other myositis, left lower leg: Secondary | ICD-10-CM | POA: Diagnosis not present

## 2018-07-20 DIAGNOSIS — M609 Myositis, unspecified: Secondary | ICD-10-CM

## 2018-07-20 DIAGNOSIS — Z6837 Body mass index (BMI) 37.0-37.9, adult: Secondary | ICD-10-CM | POA: Diagnosis not present

## 2018-07-20 NOTE — Assessment & Plan Note (Addendum)
BP at goal 128/77, HR 60 Continue Atenolol 25mg  BID, Lisinopril 10mg  QD Follow DASH diet, remain as active as possible

## 2018-07-20 NOTE — Assessment & Plan Note (Addendum)
Per cards- Hyperlipidemia, unspecified hyperlipidemia type  Recent LDL 80.  Given his history of CK elevation in the past, I would not adjust his statin dose any further.  He should continue good dietary habits in addition to low-dose atorvastatin 10mg   Encouraged DASH diet and to remain as active as possible

## 2018-07-20 NOTE — Assessment & Plan Note (Signed)
Please reduce salt intake in diet and continue to remain as active as possible. Increase plain water intake, strive for at least 68 oz/day. Please follow-up in 3 months for physical. Please ensure that your MyChart is active so you can communicate with your providers while you travel.

## 2018-07-20 NOTE — Assessment & Plan Note (Signed)
Body mass index is 37.17 kg/m.  Current wt 266 DASH diet, remain as active as possible

## 2018-07-20 NOTE — Assessment & Plan Note (Signed)
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 Will proceed with lab testing and referral for muscle biopsy (LEFT VASTUS LATERALIS).  He is currently awaiting results of L vastus laterlis bx completed 07/11/18

## 2018-07-20 NOTE — Patient Instructions (Addendum)
DASH Eating Plan DASH stands for "Dietary Approaches to Stop Hypertension." The DASH eating plan is a healthy eating plan that has been shown to reduce high blood pressure (hypertension). It may also reduce your risk for type 2 diabetes, heart disease, and stroke. The DASH eating plan may also help with weight loss. What are tips for following this plan?  General guidelines  Avoid eating more than 2,300 mg (milligrams) of salt (sodium) a day. If you have hypertension, you may need to reduce your sodium intake to 1,500 mg a day.  Limit alcohol intake to no more than 1 drink a day for nonpregnant women and 2 drinks a day for men. One drink equals 12 oz of beer, 5 oz of wine, or 1 oz of hard liquor.  Work with your health care provider to maintain a healthy body weight or to lose weight. Ask what an ideal weight is for you.  Get at least 30 minutes of exercise that causes your heart to beat faster (aerobic exercise) most days of the week. Activities may include walking, swimming, or biking.  Work with your health care provider or diet and nutrition specialist (dietitian) to adjust your eating plan to your individual calorie needs. Reading food labels   Check food labels for the amount of sodium per serving. Choose foods with less than 5 percent of the Daily Value of sodium. Generally, foods with less than 300 mg of sodium per serving fit into this eating plan.  To find whole grains, look for the word "whole" as the first word in the ingredient list. Shopping  Buy products labeled as "low-sodium" or "no salt added."  Buy fresh foods. Avoid canned foods and premade or frozen meals. Cooking  Avoid adding salt when cooking. Use salt-free seasonings or herbs instead of table salt or sea salt. Check with your health care provider or pharmacist before using salt substitutes.  Do not fry foods. Cook foods using healthy methods such as baking, boiling, grilling, and broiling instead.  Cook with  heart-healthy oils, such as olive, canola, soybean, or sunflower oil. Meal planning  Eat a balanced diet that includes: ? 5 or more servings of fruits and vegetables each day. At each meal, try to fill half of your plate with fruits and vegetables. ? Up to 6-8 servings of whole grains each day. ? Less than 6 oz of lean meat, poultry, or fish each day. A 3-oz serving of meat is about the same size as a deck of cards. One egg equals 1 oz. ? 2 servings of low-fat dairy each day. ? A serving of nuts, seeds, or beans 5 times each week. ? Heart-healthy fats. Healthy fats called Omega-3 fatty acids are found in foods such as flaxseeds and coldwater fish, like sardines, salmon, and mackerel.  Limit how much you eat of the following: ? Canned or prepackaged foods. ? Food that is high in trans fat, such as fried foods. ? Food that is high in saturated fat, such as fatty meat. ? Sweets, desserts, sugary drinks, and other foods with added sugar. ? Full-fat dairy products.  Do not salt foods before eating.  Try to eat at least 2 vegetarian meals each week.  Eat more home-cooked food and less restaurant, buffet, and fast food.  When eating at a restaurant, ask that your food be prepared with less salt or no salt, if possible. What foods are recommended? The items listed may not be a complete list. Talk with your dietitian about   what dietary choices are best for you. Grains Whole-grain or whole-wheat bread. Whole-grain or whole-wheat pasta. Brown rice. Oatmeal. Quinoa. Bulgur. Whole-grain and low-sodium cereals. Pita bread. Low-fat, low-sodium crackers. Whole-wheat flour tortillas. Vegetables Fresh or frozen vegetables (raw, steamed, roasted, or grilled). Low-sodium or reduced-sodium tomato and vegetable juice. Low-sodium or reduced-sodium tomato sauce and tomato paste. Low-sodium or reduced-sodium canned vegetables. Fruits All fresh, dried, or frozen fruit. Canned fruit in natural juice (without  added sugar). Meat and other protein foods Skinless chicken or turkey. Ground chicken or turkey. Pork with fat trimmed off. Fish and seafood. Egg whites. Dried beans, peas, or lentils. Unsalted nuts, nut butters, and seeds. Unsalted canned beans. Lean cuts of beef with fat trimmed off. Low-sodium, lean deli meat. Dairy Low-fat (1%) or fat-free (skim) milk. Fat-free, low-fat, or reduced-fat cheeses. Nonfat, low-sodium ricotta or cottage cheese. Low-fat or nonfat yogurt. Low-fat, low-sodium cheese. Fats and oils Soft margarine without trans fats. Vegetable oil. Low-fat, reduced-fat, or light mayonnaise and salad dressings (reduced-sodium). Canola, safflower, olive, soybean, and sunflower oils. Avocado. Seasoning and other foods Herbs. Spices. Seasoning mixes without salt. Unsalted popcorn and pretzels. Fat-free sweets. What foods are not recommended? The items listed may not be a complete list. Talk with your dietitian about what dietary choices are best for you. Grains Baked goods made with fat, such as croissants, muffins, or some breads. Dry pasta or rice meal packs. Vegetables Creamed or fried vegetables. Vegetables in a cheese sauce. Regular canned vegetables (not low-sodium or reduced-sodium). Regular canned tomato sauce and paste (not low-sodium or reduced-sodium). Regular tomato and vegetable juice (not low-sodium or reduced-sodium). Pickles. Olives. Fruits Canned fruit in a light or heavy syrup. Fried fruit. Fruit in cream or butter sauce. Meat and other protein foods Fatty cuts of meat. Ribs. Fried meat. Bacon. Sausage. Bologna and other processed lunch meats. Salami. Fatback. Hotdogs. Bratwurst. Salted nuts and seeds. Canned beans with added salt. Canned or smoked fish. Whole eggs or egg yolks. Chicken or turkey with skin. Dairy Whole or 2% milk, cream, and half-and-half. Whole or full-fat cream cheese. Whole-fat or sweetened yogurt. Full-fat cheese. Nondairy creamers. Whipped toppings.  Processed cheese and cheese spreads. Fats and oils Butter. Stick margarine. Lard. Shortening. Ghee. Bacon fat. Tropical oils, such as coconut, palm kernel, or palm oil. Seasoning and other foods Salted popcorn and pretzels. Onion salt, garlic salt, seasoned salt, table salt, and sea salt. Worcestershire sauce. Tartar sauce. Barbecue sauce. Teriyaki sauce. Soy sauce, including reduced-sodium. Steak sauce. Canned and packaged gravies. Fish sauce. Oyster sauce. Cocktail sauce. Horseradish that you find on the shelf. Ketchup. Mustard. Meat flavorings and tenderizers. Bouillon cubes. Hot sauce and Tabasco sauce. Premade or packaged marinades. Premade or packaged taco seasonings. Relishes. Regular salad dressings. Where to find more information:  National Heart, Lung, and Blood Institute: www.nhlbi.nih.gov  American Heart Association: www.heart.org Summary  The DASH eating plan is a healthy eating plan that has been shown to reduce high blood pressure (hypertension). It may also reduce your risk for type 2 diabetes, heart disease, and stroke.  With the DASH eating plan, you should limit salt (sodium) intake to 2,300 mg a day. If you have hypertension, you may need to reduce your sodium intake to 1,500 mg a day.  When on the DASH eating plan, aim to eat more fresh fruits and vegetables, whole grains, lean proteins, low-fat dairy, and heart-healthy fats.  Work with your health care provider or diet and nutrition specialist (dietitian) to adjust your eating plan to your   individual calorie needs. This information is not intended to replace advice given to you by your health care provider. Make sure you discuss any questions you have with your health care provider. Document Released: 07/01/2011 Document Revised: 07/05/2016 Document Reviewed: 07/05/2016 Elsevier Interactive Patient Education  2019 [name].  Please reduce salt intake in diet and continue to remain as active as possible. Increase  plain water intake, strive for at least 68 oz/day. Please follow-up in 3 months for physical. Please ensure that your MyChart is active so you can communicate with your providers while you travel. Have a great trip to [place]! HAPPY HOLIDAYS, GREAT TO SEE YOU!

## 2018-08-05 ENCOUNTER — Encounter (HOSPITAL_COMMUNITY): Payer: Self-pay | Admitting: Neurological Surgery

## 2018-08-15 DIAGNOSIS — G7241 Inclusion body myositis [IBM]: Secondary | ICD-10-CM

## 2018-08-15 NOTE — Telephone Encounter (Signed)
I called patient. Reviewed results. 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 Agree with creatine supplement and CoQ10 supplement.   Also will refer to [facility] Neuromuscular clinic (patient in [place] until April 2020).   Orders Placed This Encounter  Procedures  . Ambulatory referral to Neurology    Referral Priority:   Urgent    Referral Type:   Consultation    Referral Reason:   Specialty Services Required    Requested Specialty:   Neurology    Number of Visits Requested:   1   Penni Bombard, MD 3/66/8159, 4:70 PM Certified in Neurology, Neurophysiology and Neuroimaging  [facility] [address] [place], [place] [address] [phone]

## 2018-08-18 ENCOUNTER — Telehealth: Payer: Self-pay | Admitting: Diagnostic Neuroimaging

## 2018-08-18 NOTE — Telephone Encounter (Signed)
[facility] and stated that they are not taking new medicare patients. They spoke with the patient and gave him some other options of places to go. They stated that he was going to to call around and decide where he'd like to go and let us know.

## 2018-09-13 DIAGNOSIS — G7241 Inclusion body myositis [IBM]: Secondary | ICD-10-CM | POA: Diagnosis not present

## 2018-09-13 DIAGNOSIS — M6281 Muscle weakness (generalized): Secondary | ICD-10-CM | POA: Diagnosis not present

## 2018-10-12 DIAGNOSIS — M3320 Polymyositis, organ involvement unspecified: Secondary | ICD-10-CM | POA: Diagnosis not present

## 2018-10-25 DIAGNOSIS — M3320 Polymyositis, organ involvement unspecified: Secondary | ICD-10-CM | POA: Diagnosis not present

## 2018-10-26 DIAGNOSIS — M3320 Polymyositis, organ involvement unspecified: Secondary | ICD-10-CM | POA: Diagnosis not present

## 2018-11-06 ENCOUNTER — Encounter: Payer: Medicare Other | Admitting: Adult Health

## 2018-11-22 ENCOUNTER — Encounter: Payer: Self-pay | Admitting: *Deleted

## 2018-11-22 ENCOUNTER — Telehealth: Payer: Self-pay | Admitting: Diagnostic Neuroimaging

## 2018-11-22 DIAGNOSIS — M3320 Polymyositis, organ involvement unspecified: Secondary | ICD-10-CM | POA: Diagnosis not present

## 2018-11-22 NOTE — Telephone Encounter (Signed)
Patient calling to see if he can see Dr Leta Baptist again & be set up for infusions. He will be in [place] on Monday 11/27/2018. He has been living in [place]. Please call him to discuss [phone]

## 2018-11-22 NOTE — Telephone Encounter (Signed)
Called patient to get more information. He stated in Dec he had a  Biopsy, and the results showed "something not quite conclusive" In [place] he is seeing Dr Lennie Odor who is a  specialist in [place] (inclusive body myositis). The patient stated Dr Lennie Odor recommended a fairly new blood test only done in [place]. The result came back negative, so Dr Lennie Odor said he can either repeat biopsies in several locations or try 4  Monthly infusions. The patient cannot recall name of medication. He is getting 2nd infusion today, next one due the end of May. He will return to [facility] Monday. I advised him to have all records faxed to Dr Linna Hoff for review. Informed him that due to current COVID 19 pandemic, our office is severely reducing in person visits in order to minimize the risk to our patients and healthcare providers. We rare scheduling video visits. He stated that was fine, he wanted advice and to discuss receiving the remaining 2 infusions here. He was unable to write down fax #, so I advised will send via my chart. He stated he will call next week to check on fax, Dr Penumalli's review, response. He verbalized understanding, appreciation. My chart sent.

## 2018-11-23 DIAGNOSIS — M3320 Polymyositis, organ involvement unspecified: Secondary | ICD-10-CM | POA: Diagnosis not present

## 2018-11-27 NOTE — Telephone Encounter (Signed)
Called patient and informed him we received office notes from Dr Clovis Riley dated 09/13/18, 10/12/18. I advised him we need to schedule FU to discuss next steps with Dr Leta Baptist. I advised him due to current COVID 19 pandemic, our office is severely reducing in person visits in order to minimize the risk to our patients and healthcare providers. We are scheduling video visits. He stated he would call back tomorrow as he was on his way home from [facility], verbalized understanding, appreciation of call.

## 2018-11-29 NOTE — Telephone Encounter (Signed)
Patient called to schedule FU to discuss next steps and infusions following treatments in [place]. Scheduled for video visit, his e mail is : [email or web address]. Updated EMR, Patient verbalized understanding, appreciation.  Office notes from provider in [place] e mailed to Dr Leta Baptist for review.

## 2018-11-30 ENCOUNTER — Other Ambulatory Visit: Payer: Self-pay

## 2018-11-30 ENCOUNTER — Telehealth: Payer: Self-pay | Admitting: *Deleted

## 2018-11-30 ENCOUNTER — Ambulatory Visit (INDEPENDENT_AMBULATORY_CARE_PROVIDER_SITE_OTHER): Payer: Medicare Other | Admitting: Diagnostic Neuroimaging

## 2018-11-30 DIAGNOSIS — G729 Myopathy, unspecified: Secondary | ICD-10-CM | POA: Diagnosis not present

## 2018-11-30 NOTE — Telephone Encounter (Signed)
Thank you. Order signed. -VRP

## 2018-11-30 NOTE — Progress Notes (Signed)
    Virtual Visit via Video Note  I connected with [name] on 11/30/18 at  2:30 PM EDT by a video enabled telemedicine application and verified that I am speaking with the correct person using two identifiers.   I discussed the limitations of evaluation and management by telemedicine and the availability of in person appointments. The patient expressed understanding and agreed to proceed.  Patient is at their home. I am at the office.    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 Georgina Snell office regarding dosing) - check labs  Orders Placed This Encounter  Procedures  . CBC with Differential/Platelet  . Comprehensive metabolic panel  . CK    Follow Up Instructions:  - Return in about 4 months (around 04/02/2019).    I discussed the assessment and treatment plan with the patient. The patient was provided an opportunity to ask questions and all were answered. The patient agreed with the plan and demonstrated an understanding of the instructions.   The patient was advised to call back or seek an in-person evaluation if the symptoms worsen or if the condition fails to improve as anticipated.  I provided 15 minutes of non-face-to-face time during this encounter.   Penni Bombard, MD 04/03/2425, 8:34 PM Certified in Neurology, Neurophysiology and Neuroimaging  [facility] [address] [place], [place] [address] [phone]

## 2018-11-30 NOTE — Telephone Encounter (Signed)
Per Dr Leta Baptist, called [facility], [place] , [place] to clarify details of patient's previous IVIG infusions he received there under supervision of Dr Mee Hives [phone]). Spoke with Santiago Glad RN who was involved in patient's infusions. She stated Dr Georgina Snell standard IVIG infusion is not weight-based. He typically orders 80 gms each day x 2 days, every 4 weeks. She stated the pre meds [name] received were Tylenol 1000 mg PO and Benadryl 25 mg IV. He had been titrated up to 216 cc/hr and was tolerating it well. Overall she stated he tolerated infusions well.  Note sent to Dr Leta Baptist.

## 2018-12-02 ENCOUNTER — Other Ambulatory Visit: Payer: Self-pay | Admitting: Internal Medicine

## 2018-12-02 DIAGNOSIS — E785 Hyperlipidemia, unspecified: Secondary | ICD-10-CM

## 2018-12-02 DIAGNOSIS — I10 Essential (primary) hypertension: Secondary | ICD-10-CM

## 2018-12-02 DIAGNOSIS — G729 Myopathy, unspecified: Secondary | ICD-10-CM

## 2018-12-04 NOTE — Telephone Encounter (Signed)
IVIG order, last 2 office visit notes, demographics, insurance information, EMG/NCS and lab results given to [facility], infusion suite to process IVIG order.

## 2018-12-05 ENCOUNTER — Other Ambulatory Visit (INDEPENDENT_AMBULATORY_CARE_PROVIDER_SITE_OTHER): Payer: Self-pay

## 2018-12-05 ENCOUNTER — Other Ambulatory Visit: Payer: Self-pay

## 2018-12-05 DIAGNOSIS — Z0289 Encounter for other administrative examinations: Secondary | ICD-10-CM

## 2018-12-05 DIAGNOSIS — G729 Myopathy, unspecified: Secondary | ICD-10-CM | POA: Diagnosis not present

## 2018-12-06 ENCOUNTER — Telehealth: Payer: Self-pay | Admitting: *Deleted

## 2018-12-06 LAB — CBC WITH DIFFERENTIAL/PLATELET
Basophils Absolute: 0 10*3/uL (ref 0.0–0.2)
Basos: 1 %
EOS (ABSOLUTE): 0.3 10*3/uL (ref 0.0–0.4)
Eos: 8 %
Hematocrit: 37.2 % — ABNORMAL LOW (ref 37.5–51.0)
Hemoglobin: 12.2 g/dL — ABNORMAL LOW (ref 13.0–17.7)
Immature Grans (Abs): 0 10*3/uL (ref 0.0–0.1)
Immature Granulocytes: 0 %
Lymphocytes Absolute: 1 10*3/uL (ref 0.7–3.1)
Lymphs: 29 %
MCH: 33.9 pg — ABNORMAL HIGH (ref 26.6–33.0)
MCHC: 32.8 g/dL (ref 31.5–35.7)
MCV: 103 fL — ABNORMAL HIGH (ref 79–97)
Monocytes Absolute: 0.5 10*3/uL (ref 0.1–0.9)
Monocytes: 15 %
Neutrophils Absolute: 1.6 10*3/uL (ref 1.4–7.0)
Neutrophils: 47 %
Platelets: 135 10*3/uL — ABNORMAL LOW (ref 150–450)
RBC: 3.6 x10E6/uL — ABNORMAL LOW (ref 4.14–5.80)
RDW: 13.1 % (ref 11.6–15.4)
WBC: 3.4 10*3/uL (ref 3.4–10.8)

## 2018-12-06 LAB — COMPREHENSIVE METABOLIC PANEL
ALT: 32 IU/L (ref 0–44)
AST: 26 IU/L (ref 0–40)
Albumin/Globulin Ratio: 1.3 (ref 1.2–2.2)
Albumin: 4.1 g/dL (ref 3.8–4.8)
Alkaline Phosphatase: 49 IU/L (ref 39–117)
BUN/Creatinine Ratio: 27 — ABNORMAL HIGH (ref 10–24)
BUN: 26 mg/dL (ref 8–27)
Bilirubin Total: 0.4 mg/dL (ref 0.0–1.2)
CO2: 22 mmol/L (ref 20–29)
Calcium: 8.8 mg/dL (ref 8.6–10.2)
Chloride: 104 mmol/L (ref 96–106)
Creatinine, Ser: 0.95 mg/dL (ref 0.76–1.27)
GFR calc Af Amer: 93 mL/min/{1.73_m2} (ref >59)
GFR calc non Af Amer: 81 mL/min/{1.73_m2} (ref >59)
Globulin, Total: 3.1 g/dL (ref 1.5–4.5)
Glucose: 81 mg/dL (ref 65–99)
Potassium: 4.6 mmol/L (ref 3.5–5.2)
Sodium: 140 mmol/L (ref 134–144)
Total Protein: 7.2 g/dL (ref 6.0–8.5)

## 2018-12-06 LAB — CK: Total CK: 204 U/L (ref 41–331)

## 2018-12-06 NOTE — Telephone Encounter (Signed)
Spoke with patient and informed him his labs are unremarkable labs. Continue current plan. Advised he'll get a call from infusion RN when approval is back for IVIG. Patient verbalized understanding, appreciation.

## 2018-12-19 ENCOUNTER — Other Ambulatory Visit: Payer: Self-pay | Admitting: Internal Medicine

## 2018-12-19 MED ORDER — ATORVASTATIN CALCIUM 10 MG PO TABS: 10.0000 mg | ORAL_TABLET | Freq: Every day | ORAL | 1 refills | Status: DC

## 2018-12-25 ENCOUNTER — Other Ambulatory Visit: Payer: Self-pay | Admitting: Internal Medicine

## 2018-12-25 DIAGNOSIS — G729 Myopathy, unspecified: Secondary | ICD-10-CM

## 2018-12-25 DIAGNOSIS — E782 Mixed hyperlipidemia: Secondary | ICD-10-CM

## 2018-12-25 DIAGNOSIS — I10 Essential (primary) hypertension: Secondary | ICD-10-CM

## 2018-12-25 NOTE — Telephone Encounter (Signed)
Received fax from [facility] re: need additional information regarding IVIG orders. [facility] requesting [facility]'s address, phone #, specific drug ordered, and detailed referral.  Papers given to [place], infusion with copy of his demographics and last office note. She stated she would follow up on this request.

## 2019-01-04 ENCOUNTER — Other Ambulatory Visit: Payer: Self-pay | Admitting: *Deleted

## 2019-01-04 DIAGNOSIS — M5127 Other intervertebral disc displacement, lumbosacral region: Secondary | ICD-10-CM

## 2019-01-04 DIAGNOSIS — M48061 Spinal stenosis, lumbar region without neurogenic claudication: Secondary | ICD-10-CM

## 2019-01-04 DIAGNOSIS — R937 Abnormal findings on diagnostic imaging of other parts of musculoskeletal system: Secondary | ICD-10-CM

## 2019-01-16 ENCOUNTER — Telehealth: Payer: Self-pay | Admitting: *Deleted

## 2019-01-16 NOTE — Telephone Encounter (Signed)
Received call from Larkin Ina, patient coordinator with [facility]. He stated he called patient to inform him they were going to ship out his medication to be infused in our office. The patient advised Larkin Ina to close the referral based on his $3100 Co pay, which he is responsible for until his out of pocket with medicare part D is met. OOP is $6350. The patient told Larkin Ina he did not have to pay anything when he received infusions in a hospital. Larkin Ina stated hospital infusions would be billed differently, not through Part D. Larkin Ina offered to refer patient to their financial hardship program, but patient refused. Larkin Ina stated we can do buy and bill option for $0 cost to patient. However [facility] would not be able to assist with buy and bill. I advised Larkin Ina I will pass this information to [employer], Infusion suite. He suggested Liann call patient to discuss buy and ill option. He stated he will give Liann's # to Lattie Haw,  the authorization rep with [facility].  Note given to Liann, routed to Dr Leta Baptist.

## 2019-01-17 NOTE — Telephone Encounter (Addendum)
Called patient to discuss IVIG infusions. He stated that he will not pay deductible because he received 2 IVIG infusions in [place] for $0. I advised we can send him to [facility] for infusions but cannot guarantee his cost would be zero. He is agreeable to try this route. I made multiple calls ( > 1 hour on phone) to Medicare #s and was unable to find out anything about his probable costs. Called [employer] for insurance information for his Rx:  [phone], ID# [id number] Grp# [id number], bin [id number], plan: mpd.  Called [phone], spoke with Joey, [facility] who stated his benefits are handled thru [facility]. He transferred call. Spoke with Rem, IVIG is not covered under his plan. She will fax form for formulary exception. Call Ref# [id number].  Received PA forms from [facility] for formulary exception for IVIG. Form on Dr [employer] desk for review, completion, signature.

## 2019-01-18 NOTE — Telephone Encounter (Signed)
IVIG PA forms, supporting documents, tests, MRIs faxed to [facility].

## 2019-01-22 NOTE — Telephone Encounter (Signed)
Received fax from [facility]: Gamunex -C (IVIG) 40 gm/400 mL solution approved under Medicare Part D benefit. Approved quantity: 1600 vials per 28 days. Start date: 01/19/2019, end date: until further notice.  Gamunex order sheet for [facility] short stay on Dr [facility] desk for completion, signature.

## 2019-01-23 NOTE — Telephone Encounter (Signed)
Gamunex-C orders signed, and faxed along with med/allergy list, [facility] approval letter for medication to [facility] short stay unit. Received confirmation of fax received.

## 2019-02-06 DIAGNOSIS — M159 Polyosteoarthritis, unspecified: Secondary | ICD-10-CM | POA: Diagnosis not present

## 2019-02-06 DIAGNOSIS — M3320 Polymyositis, organ involvement unspecified: Secondary | ICD-10-CM | POA: Diagnosis not present

## 2019-02-06 DIAGNOSIS — M1990 Unspecified osteoarthritis, unspecified site: Secondary | ICD-10-CM | POA: Diagnosis not present

## 2019-02-14 ENCOUNTER — Encounter (HOSPITAL_COMMUNITY): Payer: Medicare Other

## 2019-02-15 ENCOUNTER — Encounter (HOSPITAL_COMMUNITY): Payer: Medicare Other

## 2019-03-15 DIAGNOSIS — Z79899 Other long term (current) drug therapy: Secondary | ICD-10-CM | POA: Diagnosis not present

## 2019-03-15 DIAGNOSIS — G7241 Inclusion body myositis [IBM]: Secondary | ICD-10-CM | POA: Diagnosis not present

## 2019-03-15 DIAGNOSIS — M159 Polyosteoarthritis, unspecified: Secondary | ICD-10-CM | POA: Diagnosis not present

## 2019-03-15 DIAGNOSIS — M3320 Polymyositis, organ involvement unspecified: Secondary | ICD-10-CM | POA: Diagnosis not present

## 2019-04-03 NOTE — Progress Notes (Signed)
Subjective:    Patient ID: [name], adult    DOB: [birth year], 68 y.o.   MRN: [id number]  HPI: 02/15/18 OV: [name] is here to establish as a new pt.  He is a pleasant 68 year old male. PMH:  HTN, RBBB, CAD, Angina, HLD, Spinal stenosis He has cardiac stent placed-1998, 2017, has been off anticoagulants for >18 months Failed cervical fusion 2011 Lumbar Fusions- 2012, 2013 He has sig extremity weakness and poor muscle tone-all worsening since 2011 He recently flew to [place] at end of June and since then has had R thigh pain/spasm He recently started [employer] and has lost >14 lbs, goal is to loss 40 lbs He estimates to drink 40-50 oz water/day He enjoys a 1-2 beers 3-4 times/week He denies tobacco use He has not seen Neurology since 2013- in [place]  He is followed by Cards/DR. Harrington Challenger- last OV 12/2017  07/20/18 OV: 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 Will proceed with lab testing and referral for muscle biopsy (LEFT VASTUS LATERALIS). He is currently awaiting results of L vastus laterlis bx completed 07/11/18  He was seen by cards 06/19/18: Essential hypertension Borderline elevation in his blood pressure today. However, his blood pressures are usually optimal. Continue to monitor for now. Continue current therapy. Hyperlipidemia, unspecified hyperlipidemia type Recent LDL 80. Given his history of CK elevation in the past, I would not adjust his statin dose any further. He should continue good dietary habits in addition to low-dose atorvastatin. He reports medication compliance, denies SE He reports using moderate-large amounts of salt on food daily. He remains as active as possible- house/yard work.  His pool is closed for the season, unable to swim/perform water aerobics He has maintained weight loss with  portion control- current wt 266 He continues to abstain from tobacco/vape use He will be leaving for [place] 07/24/18 for 3 months Encouraged pt to use MyChart to communicate with providers while he is out of state  07/10/18 BMP-WNL 04/04/2019 OV:  [name] is here to discuss recent treatments for body myositis, increase in lower ext edema, increase in fatigue, and increase in pain levels (lower back and lower ext) . 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 I went over the issues related to this diagnosis and the prognosis as well as ongoing research studies. I have asked him to taper the prednisone by 10 mg every 2 weeks over the next 6 weeks. I will follow up with him when he is on 10 mg in 6 weeks time at that point given the slightly slower taper to continue to finish tapering off of the prednisone".  Discussed the nature of body myositis-progressive muscle disorder characterized by muscle inflammation, weakness, and atrophy. He would like another referral to local Neurologist.  He reports increase in lower back pain- he would like referral to new pain clinic.  He denies CP/chest tightness with exertion.  He denies any use of Nitro  He has gained >30 lbs since Dec 2019- some gain likely r/t to prednisone use  Patient Care Team    Relationship Specialty Notifications Start End  Mina Marble D, NP PCP - General Family Medicine  02/20/18   [name], MD PCP - Cardiology Cardiology Admissions 06/19/18   [name], MD Consulting Physician Cardiology  02/15/18   Harriett Sine, MD Consulting Physician Dermatology  02/15/18      Patient Active Problem List   Diagnosis Date Noted  . Bilateral lower extremity edema 04/04/2019  . BMI 37.0-37.9, adult 07/20/2018  . Encounter for Medicare annual wellness exam 04/27/2018  . Colon cancer screening 04/27/2018  . Flu vaccine need 04/27/2018  . Healthcare maintenance 02/15/2018  . Stented coronary artery   . Myositis   . Hyperlipidemia   . Coronary artery disease   . Spinal stenosis   . Weakness   . HTN (hypertension)      Past Medical History:  Diagnosis Date  . Coronary artery disease    a. s/p BMS to LAD (1998) b. s/p DES to RCA (06/24/14)  . Dyspnea   . HTN (hypertension)   . Hyperlipidemia   . Myositis    chronic CK elevation ( 415 CK, ALL MM CK subtype, seen by Rheumatology and considered normal Variant)  . Palatal fistula   . Palpable abd. aorta   . Pneumonia   . RBBB   . Spinal stenosis    Dr Vaughan Sine  surgery  . Weakness      Past Surgical History:  Procedure Laterality Date  . CERVICAL FUSION  09/2009  . CORONARY ANGIOPLASTY    . CORONARY STENT PLACEMENT  06/24/2014   DES to RCA  . EYE SURGERY     Lasik  . HYDROCELE EXCISION    . LEFT HEART CATHETERIZATION WITH CORONARY ANGIOGRAM N/A 06/24/2014   Procedure: LEFT HEART CATHETERIZATION WITH CORONARY ANGIOGRAM;  Surgeon: Troy Sine, MD;  Location: [facility] CATH LAB;  Service: Cardiovascular;  Laterality: N/A;  . LUMBAR SPINE SURGERY  5/13  . MUSCLE BIOPSY Left 07/11/2018   Procedure: Left Vastus lateralus muscle biopsy;  Surgeon: Judith Part, MD;  Location: [facility];  Service: Neurosurgery;  Laterality: Left;  Left Vastus lateralus muscle biopsy  . PERCUTANEOUS CORONARY STENT INTERVENTION (PCI-S)  06/24/2014   Procedure: PERCUTANEOUS CORONARY STENT INTERVENTION (PCI-S);  Surgeon: Troy Sine, MD;  [facility] CATH LAB;  Service: Cardiovascular;;  Distal RCA     Family History  Problem Relation Age of Onset  . Heart attack Mother   . Cancer Mother        esophageal  .  Aneurysm Father   . Heart disease Father   . 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 Stroke Neg Hx      Social History   Substance and Sexual Activity  Drug Use No     Social History   Substance and Sexual Activity  Alcohol Use Yes   Comment: 3-6 cans per week     Social History   Tobacco Use  Smoking Status Former Smoker  . Packs/day: 0.75  . Years: 23.00  . Pack years: 17.25  . Types: Cigarettes  . Quit date: 07/26/1989  . Years since quitting: 29.7  Smokeless Tobacco Never Used     Outpatient Encounter Medications as of 04/04/2019  Medication Sig  . aspirin EC 81 MG EC tablet Take 1 tablet (81 mg total) by mouth daily.  Marland Kitchen  atenolol (TENORMIN) 25 MG tablet Take 1 tablet (25 mg total) by mouth 2 (two) times daily.  Marland Kitchen atorvastatin (LIPITOR) 10 MG tablet Take 1 tablet (10 mg total) by mouth daily.  Marland Kitchen b complex vitamins tablet Take 1 tablet by mouth daily.  . betamethasone dipropionate (DIPROLENE) 0.05 % cream Apply 1 application topically every other day.  . cholecalciferol (VITAMIN D) 1000 units tablet Take 1,000 Units by mouth daily.  . Coenzyme Q10 (CO Q 10) 100 MG CAPS Take 100 mg by mouth daily.   . diphenhydrAMINE (BENADRYL) 25 MG tablet Take 50 mg by mouth at bedtime.  . folic acid (FOLVITE) Q000111Q MCG tablet Take 800 mcg by mouth daily.   . furosemide (LASIX) 40 MG tablet Take 40 mg by mouth daily as needed for fluid or edema.   Marland Kitchen glucosamine-chondroitin 500-400 MG tablet Take 1 tablet by mouth daily.  Marland Kitchen ibuprofen (ADVIL,MOTRIN) 800 MG tablet Take 800 mg by mouth daily.   Javier Docker Oil 500 MG CAPS Take 500 mg by mouth daily.   Marland Kitchen lisinopril (ZESTRIL) 10 MG tablet Take 1 tablet by mouth once daily  . Melatonin 10 MG TABS Take 20 mg by mouth at bedtime.  . nitroGLYCERIN (NITROSTAT) 0.4 MG SL tablet Place 1 tablet (0.4 mg total) under the tongue every 5 (five) minutes as needed for chest pain.  .  Saw Palmetto, Serenoa repens, (SAW PALMETTO PO) Take 300 mg by mouth daily.  Marland Kitchen triamcinolone cream (KENALOG) 0.1 % Apply 1 application topically as directed. (Patient taking differently: Apply 1 application topically every other day. )  . [DISCONTINUED] cyclobenzaprine (FLEXERIL) 10 MG tablet Take 1 tablet (10 mg total) by mouth at bedtime.  . [DISCONTINUED] HYDROcodone-acetaminophen (NORCO/VICODIN) 5-325 MG tablet Take 1 tablet by mouth every 4 (four) hours as needed for moderate pain.   No facility-administered encounter medications on file as of 04/04/2019.     Allergies: Patient has no known allergies.  Body mass index is 41.17 kg/m.  Blood pressure 134/71, pulse 72, temperature 98.4 F (36.9 C), temperature source Oral, height 5\' 11"  (1.803 m), weight 295 lb 3.2 oz (133.9 kg), SpO2 94 %.  Review of Systems  Constitutional: Positive for activity change, fatigue and unexpected weight change. Negative for appetite change, chills, diaphoresis and fever.  HENT: Negative for congestion.   Eyes: Negative for visual disturbance.  Respiratory: Negative for cough, chest tightness, shortness of breath, wheezing and stridor.   Cardiovascular: Positive for leg swelling. Negative for chest pain and palpitations.  Endocrine: Negative for cold intolerance, heat intolerance, polydipsia, polyphagia and polyuria.  Genitourinary:       Nocturia+  Musculoskeletal: Positive for arthralgias, back pain, gait problem, joint swelling, myalgias, neck pain and neck stiffness.  Skin: Positive for wound. Negative for color change, pallor and rash.  Neurological: Positive for weakness. Negative for dizziness and headaches.  Hematological: Negative for adenopathy. Does not bruise/bleed easily.  Psychiatric/Behavioral: Negative for agitation, behavioral problems, confusion, decreased concentration, dysphoric mood, hallucinations, self-injury, sleep disturbance and suicidal ideas. The patient is not nervous/anxious  and is not hyperactive.        Objective:   Physical Exam Constitutional:      General: He is not in acute distress.    Appearance: He is obese. He is not ill-appearing, toxic-appearing or diaphoretic.  HENT:     Head: Normocephalic and atraumatic.  Cardiovascular:     Rate and Rhythm: Normal rate.     Pulses: Normal pulses.  Heart sounds: Normal heart sounds.  Pulmonary:     Effort: Pulmonary effort is normal. No respiratory distress.     Breath sounds: Normal breath sounds. No stridor. No wheezing, rhonchi or rales.  Chest:     Chest wall: No tenderness.  Musculoskeletal:     Right lower leg: Edema present.     Left lower leg: He exhibits swelling. Edema present.     Right foot: Swelling present.     Left foot: Swelling present.     Comments: Upper ext-bil weak grip strength  LLE edema 2+/pitting RLE 1+ edema   Skin:    Capillary Refill: Capillary refill takes less than 2 seconds.  Neurological:     Mental Status: He is alert and oriented to person, place, and time.     Motor: Weakness present.     Gait: Gait abnormal.  Psychiatric:        Mood and Affect: Mood normal.        Behavior: Behavior normal.        Thought Content: Thought content normal.        Judgment: Judgment normal.        Assessment & Plan:   1. Hyperlipidemia, unspecified hyperlipidemia type   2. Hypertension, unspecified type   3. Healthcare maintenance   4. Weakness   5. Elevated glucose   6. 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 Bilateral lower extremity edema   8. Myositis of left lower extremity, unspecified myositis type     Healthcare maintenance  1) Continue all medications as directed.   Continue to taper Prednisone as directed by Neurology. 2) Referral to [facility] placed, someone from that clinic will call you to schedule an appt. 3) Referral to pain clinic placed, someone from that clinic will call you to schedule an appt. 4) Please follow-up  with your Cardiologist, re: increase in lower extremity swelling. 5) Follow heart healthy diet, limit salt. 6) Try to remain as active as tolerated. 7) Please schedule fasting lab in 1-2 weeks, complete physical in 6 weeks. 8) Continue to social distance and wear a mask when in public. 9) Call with any questions/concerns.   Hyperlipidemia Fasting labs ordered Currently on Atorvastatin 10mg  QD   HTN (hypertension) BP at goal 134/71, HR 72 Currently Atenolol 25mg  BID, Lisinopril 10mg  QD Furosemide 40mg  PRN- lower ext edema  Followed by Cards  Bilateral lower extremity edema LLE>RLE Furosemide 40mg  PRN- lower ext edema  Increase in edema >2 weeks- advised to f/u with Cards   Myositis Last OV with [name]-03/15/2019 "-I had seen the patient initially with some confusion between inclusion body myositis and polymyositis. 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 I went over the issues related to this diagnosis and the prognosis as well as ongoing research studies. I have asked him to taper the prednisone by 10 mg every 2 weeks over the next 6 weeks. I will follow up with him when he is on 10 mg in 6 weeks time at that point given the slightly slower taper to continue to finish tapering off of the prednisone".  Discussed the nature of body myositis-progressive muscle disorder characterized by muscle inflammation, weakness, and atrophy. He would like another referral to local Neurologist.    FOLLOW-UP:  Return in about 6 weeks (around 05/16/2019) for CPE.

## 2019-04-04 ENCOUNTER — Encounter: Payer: Self-pay | Admitting: Adult Health

## 2019-04-04 ENCOUNTER — Other Ambulatory Visit: Payer: Self-pay

## 2019-04-04 ENCOUNTER — Ambulatory Visit (INDEPENDENT_AMBULATORY_CARE_PROVIDER_SITE_OTHER): Payer: Medicare Other | Admitting: Adult Health

## 2019-04-04 VITALS — BP 134/71 | HR 72 | Temp 98.4°F | Ht 71.0 in | Wt 295.2 lb

## 2019-04-04 DIAGNOSIS — Z0000 Encounter for general adult medical examination without abnormal findings: Secondary | ICD-10-CM

## 2019-04-04 DIAGNOSIS — R7309 Other abnormal glucose: Secondary | ICD-10-CM | POA: Diagnosis not present

## 2019-04-04 DIAGNOSIS — M609 Myositis, unspecified: Secondary | ICD-10-CM

## 2019-04-04 DIAGNOSIS — M3320 Polymyositis, organ involvement unspecified: Secondary | ICD-10-CM

## 2019-04-04 DIAGNOSIS — R531 Weakness: Secondary | ICD-10-CM | POA: Diagnosis not present

## 2019-04-04 DIAGNOSIS — M60862 Other myositis, left lower leg: Secondary | ICD-10-CM

## 2019-04-04 DIAGNOSIS — I10 Essential (primary) hypertension: Secondary | ICD-10-CM

## 2019-04-04 DIAGNOSIS — E785 Hyperlipidemia, unspecified: Secondary | ICD-10-CM

## 2019-04-04 DIAGNOSIS — R600 Localized edema: Secondary | ICD-10-CM

## 2019-04-04 NOTE — Assessment & Plan Note (Addendum)
BP at goal 134/71, HR 72 Currently Atenolol 25mg  BID, Lisinopril 10mg  QD Furosemide 40mg  PRN- lower ext edema  Followed by Cards

## 2019-04-04 NOTE — Assessment & Plan Note (Signed)
LLE>RLE Furosemide 40mg  PRN- lower ext edema  Increase in edema >2 weeks- advised to f/u with Cards

## 2019-04-04 NOTE — Assessment & Plan Note (Signed)
  1) Continue all medications as directed.   Continue to taper Prednisone as directed by Neurology. 2) Referral to [facility] Neurology placed, someone from that clinic will call you to schedule an appt. 3) Referral to pain clinic placed, someone from that clinic will call you to schedule an appt. 4) Please follow-up with your Cardiologist, re: increase in lower extremity swelling. 5) Follow heart healthy diet, limit salt. 6) Try to remain as active as tolerated. 7) Please schedule fasting lab in 1-2 weeks, complete physical in 6 weeks. 8) Continue to social distance and wear a mask when in public. 9) Call with any questions/concerns.

## 2019-04-04 NOTE — Assessment & Plan Note (Signed)
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 I went over the issues related to this diagnosis and the prognosis as well as ongoing research studies. I have asked him to taper the prednisone by 10 mg every 2 weeks over the next 6 weeks. I will follow up with him when he is on 10 mg in 6 weeks time at that point given the slightly slower taper to continue to finish tapering off of the prednisone".  Discussed the nature of body myositis-progressive muscle disorder characterized by muscle inflammation, weakness, and atrophy. He would like another referral to local Neurologist.

## 2019-04-04 NOTE — Assessment & Plan Note (Signed)
Fasting labs ordered Currently on Atorvastatin 10mg  QD

## 2019-04-04 NOTE — Patient Instructions (Signed)
Managing Your Hypertension Hypertension is commonly called high blood pressure. This is when the force of your blood pressing against the walls of your arteries is too strong. Arteries are blood vessels that carry blood from your heart throughout your body. Hypertension forces the heart to work harder to pump blood, and may cause the arteries to become narrow or stiff. Having untreated or uncontrolled hypertension can cause heart attack, stroke, kidney disease, and other problems. What are blood pressure readings? A blood pressure reading consists of a higher number over a lower number. Ideally, your blood pressure should be below 120/80. The first ("top") number is called the systolic pressure. It is a measure of the pressure in your arteries as your heart beats. The second ("bottom") number is called the diastolic pressure. It is a measure of the pressure in your arteries as the heart relaxes. What does my blood pressure reading mean? Blood pressure is classified into four stages. Based on your blood pressure reading, your health care provider may use the following stages to determine what type of treatment you need, if any. Systolic pressure and diastolic pressure are measured in a unit called mm Hg. Normal  Systolic pressure: below 784.  Diastolic pressure: below 80. Elevated  Systolic pressure: 696-295.  Diastolic pressure: below 80. Hypertension stage 1  Systolic pressure: 284-132.  Diastolic pressure: 44-01. Hypertension stage 2  Systolic pressure: 027 or above.  Diastolic pressure: 90 or above. What health risks are associated with hypertension? Managing your hypertension is an important responsibility. Uncontrolled hypertension can lead to:  A heart attack.  A stroke.  A weakened blood vessel (aneurysm).  Heart failure.  Kidney damage.  Eye damage.  Metabolic syndrome.  Memory and concentration problems. What changes can I make to manage my hypertension?  Hypertension can be managed by making lifestyle changes and possibly by taking medicines. Your health care provider will help you make a plan to bring your blood pressure within a normal range. Eating and drinking   Eat a diet that is high in fiber and potassium, and low in salt (sodium), added sugar, and fat. An example eating plan is called the DASH (Dietary Approaches to Stop Hypertension) diet. To eat this way: ? Eat plenty of fresh fruits and vegetables. Try to fill half of your plate at each meal with fruits and vegetables. ? Eat whole grains, such as whole wheat pasta, brown rice, or whole grain bread. Fill about one quarter of your plate with whole grains. ? Eat low-fat diary products. ? Avoid fatty cuts of meat, processed or cured meats, and poultry with skin. Fill about one quarter of your plate with lean proteins such as fish, chicken without skin, beans, eggs, and tofu. ? Avoid premade and processed foods. These tend to be higher in sodium, added sugar, and fat.  Reduce your daily sodium intake. Most people with hypertension should eat less than 1,500 mg of sodium a day.  Limit alcohol intake to no more than 1 drink a day for nonpregnant women and 2 drinks a day for men. One drink equals 12 oz of beer, 5 oz of wine, or 1 oz of hard liquor. Lifestyle  Work with your health care provider to maintain a healthy body weight, or to lose weight. Ask what an ideal weight is for you.  Get at least 30 minutes of exercise that causes your heart to beat faster (aerobic exercise) most days of the week. Activities may include walking, swimming, or biking.  Include exercise  to strengthen your muscles (resistance exercise), such as weight lifting, as part of your weekly exercise routine. Try to do these types of exercises for 30 minutes at least 3 days a week.  Do not use any products that contain nicotine or tobacco, such as cigarettes and e-cigarettes. If you need help quitting, ask your health  care provider.  Control any long-term (chronic) conditions you have, such as high cholesterol or diabetes. Monitoring  Monitor your blood pressure at home as told by your health care provider. Your personal target blood pressure may vary depending on your medical conditions, your age, and other factors.  Have your blood pressure checked regularly, as often as told by your health care provider. Working with your health care provider  Review all the medicines you take with your health care provider because there may be side effects or interactions.  Talk with your health care provider about your diet, exercise habits, and other lifestyle factors that may be contributing to hypertension.  Visit your health care provider regularly. Your health care provider can help you create and adjust your plan for managing hypertension. Will I need medicine to control my blood pressure? Your health care provider may prescribe medicine if lifestyle changes are not enough to get your blood pressure under control, and if:  Your systolic blood pressure is 130 or higher.  Your diastolic blood pressure is 80 or higher. Take medicines only as told by your health care provider. Follow the directions carefully. Blood pressure medicines must be taken as prescribed. The medicine does not work as well when you skip doses. Skipping doses also puts you at risk for problems. Contact a health care provider if:  You think you are having a reaction to medicines you have taken.  You have repeated (recurrent) headaches.  You feel dizzy.  You have swelling in your ankles.  You have trouble with your vision. Get help right away if:  You develop a severe headache or confusion.  You have unusual weakness or numbness, or you feel faint.  You have severe pain in your chest or abdomen.  You vomit repeatedly.  You have trouble breathing. Summary  Hypertension is when the force of blood pumping through your arteries  is too strong. If this condition is not controlled, it may put you at risk for serious complications.  Your personal target blood pressure may vary depending on your medical conditions, your age, and other factors. For most people, a normal blood pressure is less than 120/80.  Hypertension is managed by lifestyle changes, medicines, or both. Lifestyle changes include weight loss, eating a healthy, low-sodium diet, exercising more, and limiting alcohol. This information is not intended to replace advice given to you by your health care provider. Make sure you discuss any questions you have with your health care provider. Document Released: 04/05/2012 Document Revised: 11/03/2018 Document Reviewed: 06/09/2016 Elsevier Patient Education  2020 [place].  1) Continue all medications as directed.   Continue to taper Prednisone as directed by Neurology. 2) Referral to [facility] Neurology placed, someone from that clinic will call you to schedule an appt. 3) Referral to pain clinic placed, someone from that clinic will call you to schedule an appt. 4) Please follow-up with your Cardiologist, re: increase in lower extremity swelling. 5) Follow heart healthy diet, limit salt. 6) Try to remain as active as tolerated. 7) Please schedule fasting lab in 1-2 weeks, complete physical in 6 weeks. 8) Continue to social distance and wear a mask  when in public. 9) Call with any questions/concerns.

## 2019-04-05 ENCOUNTER — Encounter: Payer: Self-pay | Admitting: Adult Health

## 2019-04-06 NOTE — Telephone Encounter (Addendum)
Pt saw his PCP 04/04/19 and was told to follow with Cardiology.Marland Kitchen appt made with Robbie Lis PA 04/09/19 on Dr. Harrington Challenger DOD day. Pt agreed and was thankful for my call.       COVID-19 Pre-Screening Questions:  . In the past 7 to 10 days have you had a cough,  shortness of breath, headache, congestion, fever (100 or greater) body aches, chills, sore throat, or sudden loss of taste or sense of smell? NO . Have you been around anyone with known Covid 19. NO . Have you been around anyone who is awaiting Covid 19 test results in the past 7 to 10 days? NO . Have you been around anyone who has been exposed to Covid 19, or has mentioned symptoms of Covid 19 within the past 7 to 10 days? NO  If you have any concerns/questions about symptoms patients report during screening (either on the phone or at threshold). Contact the provider seeing the patient or DOD for further guidance.  If neither are available contact a member of the leadership team.

## 2019-04-08 NOTE — Progress Notes (Signed)
Cardiology Office Note    Date:  04/09/2019   ID:  [name], DOB 02-Jan-1951, MRN [id number]  PCP:  [name] Grandchild, NP  Cardiologist:  [name]. Harrington [name]  Chief Complaint: edema  History of Present Illness:   [name] is a 68 y.o. adult with coronary artery disease s/p stent to LAD in 2009 and DES to the RCA in 2015, HTN, HL, polymyositis and spinal stenosis seen for edema.   He moved to [place] from [place] in 2014. He was doing well on cardiac stand point when last seen by [name].  He was treated with IVIG for polymyositis. Seen by neurologist [name]. Leta [name] and at [facility].   Recently dealing with bilateral LE edema after starting prednisone for myositis.  Given PRN lasix without improvement.  His edema continue to get worse orthopnea and weight gain.  He is sleeping in recliner.  No chest pain or syncope.  Compliant with low-sodium diet.  Past Medical History:  Diagnosis Date  . Coronary artery disease    a. s/p BMS to LAD (1998) b. s/p DES to RCA (06/24/14)  . Dyspnea   . HTN (hypertension)   . Hyperlipidemia   . Myositis    chronic CK elevation ( 415 CK, ALL MM CK subtype, seen by Rheumatology and considered normal Variant)  . Palatal fistula   . Palpable abd. aorta   . Pneumonia   . RBBB   . Spinal stenosis    [name]  surgery  . Weakness     Past Surgical History:  Procedure Laterality Date  . CERVICAL FUSION  09/2009  . CORONARY ANGIOPLASTY    . CORONARY STENT PLACEMENT  06/24/2014   DES to RCA  . EYE SURGERY     Lasik  . HYDROCELE EXCISION    . LEFT HEART CATHETERIZATION WITH CORONARY ANGIOGRAM N/A 06/24/2014   Procedure: LEFT HEART CATHETERIZATION WITH CORONARY ANGIOGRAM;  Surgeon: [name] Sine, MD;  Location: [facility] CATH LAB;  Service: Cardiovascular;  Laterality: N/A;  . LUMBAR SPINE SURGERY  5/13  . MUSCLE BIOPSY Left 07/11/2018   Procedure: Left Vastus lateralus muscle biopsy;  Surgeon: Judith Part, MD;  Location: [place];  Service:  Neurosurgery;  Laterality: Left;  Left Vastus lateralus muscle biopsy  . PERCUTANEOUS CORONARY STENT INTERVENTION (PCI-S)  06/24/2014   Procedure: PERCUTANEOUS CORONARY STENT INTERVENTION (PCI-S);  Surgeon: [name] Sine, MD;  Location: [facility] CATH LAB;  Service: Cardiovascular;;  Distal RCA    Current Medications: Prior to Admission medications   Medication Sig Start Date End Date Taking? Authorizing Provider  aspirin EC 81 MG EC tablet Take 1 tablet (81 mg total) by mouth daily. 06/25/14   Eileen Stanford, PA-C  atenolol (TENORMIN) 25 MG tablet Take 1 tablet (25 mg total) by mouth 2 (two) times daily. 12/25/18   [name], MD  atorvastatin (LIPITOR) 10 MG tablet Take 1 tablet (10 mg total) by mouth daily. 12/19/18   [name], MD  b complex vitamins tablet Take 1 tablet by mouth daily.    [provider]  betamethasone dipropionate (DIPROLENE) 0.05 % cream Apply 1 application topically every other day.    [provider]  cholecalciferol (VITAMIN D) 1000 units tablet Take 1,000 Units by mouth daily.    [provider]  Coenzyme Q10 (CO Q 10) 100 MG CAPS Take 100 mg by mouth daily.     [provider]  diphenhydrAMINE (BENADRYL) 25 MG tablet Take  50 mg by mouth at bedtime.    [provider]  folic acid (FOLVITE) Q000111Q MCG tablet Take 800 mcg by mouth daily.     [provider]  furosemide (LASIX) 40 MG tablet Take 40 mg by mouth daily as needed for fluid or edema.  06/17/18   [provider]  glucosamine-chondroitin 500-400 MG tablet Take 1 tablet by mouth daily.    [provider]  ibuprofen (ADVIL,MOTRIN) 800 MG tablet Take 800 mg by mouth daily.     [provider]  Javier Docker Oil 500 MG CAPS Take 500 mg by mouth daily.     [provider]  lisinopril (ZESTRIL) 10 MG tablet Take 1 tablet by mouth once daily 12/04/18   [name] T, PA-C  Melatonin 10 MG TABS Take 20 mg by mouth at bedtime.     [provider]  nitroGLYCERIN (NITROSTAT) 0.4 MG SL tablet Place 1 tablet (0.4 mg total) under the tongue every 5 (five) minutes as needed for chest pain. 12/27/16   [name], MD  Saw Palmetto, Serenoa repens, (SAW PALMETTO PO) Take 300 mg by mouth daily.    [provider]  triamcinolone cream (KENALOG) 0.1 % Apply 1 application topically as directed. Patient taking differently: Apply 1 application topically every other day.  04/24/18   [name], MD    Allergies:   Patient has no known allergies.   Social History   Socioeconomic History  . Marital status: Unknown    Spouse name: Not on file  . Number of children: Not on file  . Years of education: Not on file  . Highest education level: Not on file  Occupational History  . Not on file  Social Needs  . Financial resource strain: Not on file  . Food insecurity    Worry: Not on file    Inability: Not on file  . Transportation needs    Medical: Not on file    Non-medical: Not on file  Tobacco Use  . Smoking status: Former Smoker    Packs/day: 0.75    Years: 23.00    Pack years: 17.25    Types: Cigarettes    Quit date: 07/26/1989    Years since quitting: 29.7  . Smokeless tobacco: Never Used  Substance and Sexual Activity  . Alcohol use: Yes    Comment: 3-6 cans per week  . Drug use: No  . Sexual activity: Yes    Birth control/protection: None  Lifestyle  . Physical activity    Days per week: Not on file    Minutes per session: Not on file  . Stress: Not on file  Relationships  . Social Herbalist on phone: Not on file    Gets together: Not on file    Attends religious service: Not on file    Active member of club or organization: Not on file    Attends meetings of clubs or organizations: Not on file    Relationship status: Not on file  Other Topics Concern  . Not on file  Social History Narrative   Married, wife moving to [place]   Retired , prior occupation self   Patient is a  former smoker   Alcohol use - yes   Regular exercise   Drug Use- no    caffeine 3 cups coffee daily     Family History:  The patient's family history includes Aneurysm in his father; Cancer in his mother; Cystic  fibrosis in his brother; Heart attack in his mother; Heart disease in his father.   ROS:   Please see the history of present illness.    ROS All other systems reviewed and are negative.   PHYSICAL EXAM:   VS:  BP 124/70   Pulse 76   Ht 5\' 11"  (1.803 m)   Wt 295 lb 12.8 oz (134.2 kg)   SpO2 93%   BMI 41.26 kg/m    GEN: Well nourished, well developed, in no acute distress  HEENT: normal  Neck: no JVD, carotid bruits, or masses Cardiac: RRR; no murmurs, rubs, or gallops, 2+ bilateral lower extremity edema Respiratory: Diminished breath sounds GI: soft, nontender, nondistended, + BS MS: no deformity or atrophy  Skin: warm and dry, no rash Neuro:  Alert and Oriented x 3, Strength and sensation are intact Psych: euthymic mood, full affect  Wt Readings from Last 3 Encounters:  04/09/19 295 lb 12.8 oz (134.2 kg)  04/04/19 295 lb 3.2 oz (133.9 kg)  07/20/18 266 lb 8 oz (120.9 kg)      Studies/Labs Reviewed:   EKG:  EKG is not  ordered today.    Recent Labs: 04/20/2018: TSH 2.110 12/05/2018: ALT 32; BUN 26; Creatinine, Ser 0.95; Hemoglobin 12.2; Platelets 135; Potassium 4.6; Sodium 140   Lipid Panel    Component Value Date/Time   CHOL 136 04/20/2018 0853   TRIG 69 04/20/2018 0853   HDL 42 04/20/2018 0853   CHOLHDL 3.2 04/20/2018 0853   CHOLHDL 3.3 11/13/2015 1107   VLDL 26 11/13/2015 1107   LDLCALC 80 04/20/2018 0853    Additional studies/ records that were reviewed today include:    Cardiac Catheterization: 05/2014  ANGIOGRAPHY:   The left main coronary artery was angiographically normal short vessel which immediately bifurcated into the LAD and left circumflex coronary artery.   The LAD was a large caliber vessel.  The previously placed stent in  the proximal LAD was widely patent.  The LAD gave rise to several diagonal branches and septal perforating arteries and extended to the LV apex and was otherwise free of significant obstructive disease..   The left circumflex coronary artery was angiographically normal and gave rise to one major bifurcating obtuse marginal branch.   The RCA was a large dominant vessel that had mild 20-30% proximal narrowing, luminal irregularities in the midsegment with 20-30% eccentric stenosis  and a subtotally occluded 99% stenosis proximal to an acute marginal branch before the acute margin. The RCA supplied a large PDA and PLA vessel.   Left ventriculography revealed normal global LV contractility without focal segmental wall motion abnormalities. There was no evidence for mitral regurgitation.  Ejection fraction was 60%.  Following percutaneous coronary intervention of the RCA with ultimate insertion of a 4.018 mm Xience Alpine DES stent post dilated to 4.49 mm, the 99% stenosis was reduced to 0%.  There was brisk TIMI-3 flow. There was no evidence for dissection.   Total contrast used: 220 cc Omipaque  IMPRESSION:  Significant coronary obstructive disease with a widely patent previously placed 4.018 mm bare-metal stent in the proximal LAD without restenosis in an otherwise normal LAD and circumflex system.  Dominant RCA with 20-30% proximal areas of narrowing with luminal irregularity, 20-30% eccentric narrowing segment followed by 99% stenosis proximal to the acute margin.  Normal LV function without focal wall motion abnormalities and an ejection fraction of 60%.  Successful PCI to the subtotal occlusion of the RCA with 99% stenosis being reduced to  0% with ultimate insertion of a Xience Alpine 4.018 mm DES stent post dilated to 4.49 mm.   RECOMMENDATION:  Patient will require dual antiplatelet therapy for minimum of a year in addition to aggressive lipid-lowering therapy, ACE  inhibitor, and beta blocker therapy.  Weight loss and increased exercise will be recommended.   ASSESSMENT & PLAN:    1. LE edema/orthopnea/PND -His symptoms could be related to prednisone use however cannot rule out cardiac etiology given myositis.  Will get echocardiogram to rule out cardiomyopathy.  Check BNP and BMP.  He has gained ~ 30 pounds since last year.  Significant lower extremity edema on exam.  Sleeping in recliner.  Lasix as below.  Continue atenolol and isinopril.  2. CAD -No angina.  Continue aspirin, statin and beta-blocker.  3. HTN -Blood pressure stable and well-controlled on current medication.  4. HLD - 04/20/2018: Cholesterol, Total 136; HDL 42; LDL Calculated 80; Triglycerides 69  -Continue statin.    Medication Adjustments/Labs and Tests Ordered: Current medicines are reviewed at length with the patient today.  Concerns regarding medicines are outlined above.  Medication changes, Labs and Tests ordered today are listed in the Patient Instructions below. Patient Instructions  Medication Instructions:  Your physician has recommended you make the following change in your medication:  1.  START Lasix 40 mg taking 1 tablet by mouth for 3 days then go down to 1 tablet daily 2.  START Potassium 10 meq taking 1 tablet by mouth for 3 days then go down to taking 1 tablet daily  If you need a refill on your cardiac medications before your next appointment, please call your pharmacy.   Lab work: TODAY:  BMET & PRO BNP  If you have labs (blood work) drawn today and your tests are completely normal, you will receive your results only by: Marland Kitchen MyChart Message (if you have MyChart) OR . A paper copy in the mail If you have any lab test that is abnormal or we need to change your treatment, we will call you to review the results.  Testing/Procedures:  Your physician has requested that you have an echocardiogram [name], 04/13/2019 ARRIVE AT 8:20 FOR REGISTRATION.  Echocardiography is a painless test that uses sound waves to create images of your heart. It provides your doctor with information about the size and shape of your heart and how well your heart's chambers and valves are working. This procedure takes approximately one hour. There are no restrictions for this procedure.    Follow-Up: At [facility], you and your health needs are our priority.  As part of our continuing mission to provide you with exceptional heart care, we have created designated Provider Care Teams.  These Care Teams include your primary Cardiologist (physician) and Advanced Practice Providers (APPs -  Physician Assistants and Nurse Practitioners) who all work together to provide you with the care you need, when you need it. You will need a follow up appointment in: 04/24/2019 ARRIVE AT 10:30 TO SEE VIN Kepler Mccabe, PA-C  Any Other Special Instructions Will Be Listed Below (If Applicable).       Jarrett Soho, [place]  04/09/2019 1:42 PM    [facility] [address], [place], [place]  [address] Phone: [phone]; Fax: [phone]

## 2019-04-09 ENCOUNTER — Encounter: Payer: Self-pay | Admitting: Physician Assistant

## 2019-04-09 ENCOUNTER — Ambulatory Visit (INDEPENDENT_AMBULATORY_CARE_PROVIDER_SITE_OTHER): Payer: Medicare Other | Admitting: Physician Assistant

## 2019-04-09 ENCOUNTER — Other Ambulatory Visit: Payer: Self-pay

## 2019-04-09 VITALS — BP 124/70 | HR 76 | Ht 71.0 in | Wt 295.8 lb

## 2019-04-09 DIAGNOSIS — R600 Localized edema: Secondary | ICD-10-CM

## 2019-04-09 DIAGNOSIS — I251 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: Secondary | ICD-10-CM

## 2019-04-09 DIAGNOSIS — I10 Essential (primary) hypertension: Secondary | ICD-10-CM

## 2019-04-09 DIAGNOSIS — E785 Hyperlipidemia, unspecified: Secondary | ICD-10-CM

## 2019-04-09 DIAGNOSIS — G729 Myopathy, unspecified: Secondary | ICD-10-CM

## 2019-04-09 MED ORDER — FUROSEMIDE 40 MG PO TABS: ORAL_TABLET | ORAL | 3 refills | Status: DC

## 2019-04-09 MED ORDER — POTASSIUM CHLORIDE ER 10 MEQ PO TBCR: EXTENDED_RELEASE_TABLET | ORAL | 3 refills | Status: DC

## 2019-04-09 NOTE — Patient Instructions (Addendum)
Medication Instructions:  Your physician has recommended you make the following change in your medication:  1.  START Lasix 40 mg taking 1 tablet by mouth twice a day or 3 days then go down to 1 tablet daily 2.  START Potassium 10 meq taking 1 tablet by mouth twice a day for 3 days then go down to taking 1 tablet daily  If you need a refill on your cardiac medications before your next appointment, please call your pharmacy.   Lab work: TODAY:  BMET & PRO BNP  If you have labs (blood work) drawn today and your tests are completely normal, you will receive your results only by: Marland Kitchen MyChart Message (if you have MyChart) OR . A paper copy in the mail If you have any lab test that is abnormal or we need to change your treatment, we will call you to review the results.  Testing/Procedures:  Your physician has requested that you have an echocardiogram Friday, 04/13/2019 ARRIVE AT 8:20 FOR REGISTRATION. Echocardiography is a painless test that uses sound waves to create images of your heart. It provides your doctor with information about the size and shape of your heart and how well your heart's chambers and valves are working. This procedure takes approximately one hour. There are no restrictions for this procedure.    Follow-Up: At [facility], you and your health needs are our priority.  As part of our continuing mission to provide you with exceptional heart care, we have created designated Provider Care Teams.  These Care Teams include your primary Cardiologist (physician) and Advanced Practice Providers (APPs -  Physician Assistants and Nurse Practitioners) who all work together to provide you with the care you need, when you need it. You will need a follow up appointment in: 04/24/2019 ARRIVE AT 10:30 TO SEE VIN BHAGAT, PA-C  Any Other Special Instructions Will Be Listed Below (If Applicable).

## 2019-04-10 LAB — BASIC METABOLIC PANEL
BUN/Creatinine Ratio: 26 — ABNORMAL HIGH (ref 10–24)
BUN: 29 mg/dL — ABNORMAL HIGH (ref 8–27)
CO2: 26 mmol/L (ref 20–29)
Calcium: 9.5 mg/dL (ref 8.6–10.2)
Chloride: 101 mmol/L (ref 96–106)
Creatinine, Ser: 1.1 mg/dL (ref 0.76–1.27)
GFR calc Af Amer: 78 mL/min/{1.73_m2} (ref >59)
GFR calc non Af Amer: 68 mL/min/{1.73_m2} (ref >59)
Glucose: 106 mg/dL — ABNORMAL HIGH (ref 65–99)
Potassium: 4.9 mmol/L (ref 3.5–5.2)
Sodium: 142 mmol/L (ref 134–144)

## 2019-04-10 LAB — PRO B NATRIURETIC PEPTIDE: NT-Pro BNP: 365 pg/mL (ref 0–376)

## 2019-04-13 ENCOUNTER — Ambulatory Visit (HOSPITAL_COMMUNITY): Payer: Medicare Other | Attending: Cardiovascular Disease

## 2019-04-13 ENCOUNTER — Other Ambulatory Visit: Payer: Self-pay

## 2019-04-13 DIAGNOSIS — R600 Localized edema: Secondary | ICD-10-CM | POA: Diagnosis not present

## 2019-04-23 NOTE — Progress Notes (Signed)
Cardiology Office Note    Date:  04/24/2019   ID:  [name], DOB 08-07-1950, MRN [id number]  PCP:  Esaw Grandchild, NP  Cardiologist: Dr. Harrington Challenger  Chief Complaint: 2 weeks  follow up  History of Present Illness:   [name] is a 68 y.o. adult with PMH significant for coronary artery diseases/p stent to LAD in 2009and DES to the RCA in 2015,HTN, HL, polymyositis and spinal stenosis seen for edema follow up.  He moved to [place] from [place] in 2014. He was treated with IVIG for polymyositis. Seen by neurologist Dr. Leta Baptist and at [facility].   Seen by me 9/14 for bilateral LE edema after starting prednisone for myositis. Fluid marker normal. Edema felt likely due to prednisone. Started on short term diuretics.   Here today for follow up. Last dose of prednisone few days ago. Edema and orthopnea has been improved. No chest pain or dyspnea.   Past Medical History:  Diagnosis Date  . Coronary artery disease    a. s/p BMS to LAD (1998) b. s/p DES to RCA (06/24/14)  . Dyspnea   . HTN (hypertension)   . Hyperlipidemia   . Myositis    chronic CK elevation ( 415 CK, ALL MM CK subtype, seen by Rheumatology and considered normal Variant)  . Palatal fistula   . Palpable abd. aorta   . Pneumonia   . RBBB   . Spinal stenosis    Dr Vaughan Sine  surgery  . Weakness     Past Surgical History:  Procedure Laterality Date  . CERVICAL FUSION  09/2009  . CORONARY ANGIOPLASTY    . CORONARY STENT PLACEMENT  06/24/2014   DES to RCA  . EYE SURGERY     Lasik  . HYDROCELE EXCISION    . LEFT HEART CATHETERIZATION WITH CORONARY ANGIOGRAM N/A 06/24/2014   Procedure: LEFT HEART CATHETERIZATION WITH CORONARY ANGIOGRAM;  Surgeon: Troy Sine, MD;  Location: [facility] CATH LAB;  Service: Cardiovascular;  Laterality: N/A;  . LUMBAR SPINE SURGERY  5/13  . MUSCLE BIOPSY Left 07/11/2018   Procedure: Left Vastus lateralus muscle biopsy;  Surgeon: Judith Part, MD;  Location: [facility];  Service:  Neurosurgery;  Laterality: Left;  Left Vastus lateralus muscle biopsy  . PERCUTANEOUS CORONARY STENT INTERVENTION (PCI-S)  06/24/2014   Procedure: PERCUTANEOUS CORONARY STENT INTERVENTION (PCI-S);  Surgeon: Troy Sine, MD;  Location: [facility] CATH LAB;  Service: Cardiovascular;;  Distal RCA    Current Medications: Prior to Admission medications   Medication Sig Start Date End Date Taking? Authorizing Provider  aspirin EC 81 MG EC tablet Take 1 tablet (81 mg total) by mouth daily. 06/25/14  Yes Eileen Stanford, PA-C  atenolol (TENORMIN) 25 MG tablet Take 1 tablet (25 mg total) by mouth 2 (two) times daily. 12/25/18  Yes [name], MD  atorvastatin (LIPITOR) 10 MG tablet Take 1 tablet (10 mg total) by mouth daily. 12/19/18  Yes [name], MD  b complex vitamins tablet Take 1 tablet by mouth daily.   Yes [provider]  betamethasone dipropionate (DIPROLENE) 0.05 % cream Apply 1 application topically every other day.   Yes [provider]  cholecalciferol (VITAMIN D) 1000 units tablet Take 1,000 Units by mouth daily.   Yes [provider]  Coenzyme Q10 (CO Q 10) 100 MG CAPS Take 100 mg by mouth daily.    Yes [provider]  diphenhydrAMINE (BENADRYL) 25 MG tablet Take 50 mg by mouth  at bedtime.   Yes [provider]  folic acid (FOLVITE) Q000111Q MCG tablet Take 800 mcg by mouth daily.    Yes [provider]  furosemide (LASIX) 40 MG tablet Take 1 tablet by mouth twice a day X's 3 days then go down to taking 1 tablet by mouth daily Patient taking differently: Take 40 mg by mouth as needed.  04/09/19  Yes Graceland Wachter, PA  glucosamine-chondroitin 500-400 MG tablet Take 2 tablets by mouth daily.    Yes [provider]  ibuprofen (ADVIL,MOTRIN) 800 MG tablet Take 800 mg by mouth daily.    Yes [provider]  Javier Docker Oil 500 MG CAPS Take 500 mg by mouth daily.    Yes [provider]  lisinopril (ZESTRIL) 10 MG  tablet Take 1 tablet by mouth once daily 12/04/18  Yes Weaver, Nicki Reaper T, PA-C  Melatonin 10 MG TABS Take 20 mg by mouth at bedtime.   Yes [provider]  nitroGLYCERIN (NITROSTAT) 0.4 MG SL tablet Place 1 tablet (0.4 mg total) under the tongue every 5 (five) minutes as needed for chest pain. 12/27/16  Yes [name], MD  potassium chloride (K-DUR) 10 MEQ tablet Take 1 tablet by mouth twice a day X's 3 days then go down to taking 1 tablet by mouth daily Patient taking differently: Take 10 mEq by mouth as needed.  04/09/19  Yes Portia Wisdom, PA  Saw Palmetto, Serenoa repens, (SAW PALMETTO PO) Take 300 mg by mouth daily.   Yes [provider]  triamcinolone cream (KENALOG) 0.1 % Apply 1 application topically as directed. 04/24/18  Yes [name], MD     Allergies:   Patient has no known allergies.   Social History   Socioeconomic History  . Marital status: Unknown    Spouse name: Not on file  . Number of children: Not on file  . Years of education: Not on file  . Highest education level: Not on file  Occupational History  . Not on file  Social Needs  . Financial resource strain: Not on file  . Food insecurity    Worry: Not on file    Inability: Not on file  . Transportation needs    Medical: Not on file    Non-medical: Not on file  Tobacco Use  . Smoking status: Former Smoker    Packs/day: 0.75    Years: 23.00    Pack years: 17.25    Types: Cigarettes    Quit date: 07/26/1989    Years since quitting: 29.7  . Smokeless tobacco: Never Used  Substance and Sexual Activity  . Alcohol use: Yes    Comment: 3-6 cans per week  . Drug use: No  . Sexual activity: Yes    Birth control/protection: None  Lifestyle  . Physical activity    Days per week: Not on file    Minutes per session: Not on file  . Stress: Not on file  Relationships  . Social Herbalist on phone: Not on file    Gets together: Not on file    Attends religious service: Not on  file    Active member of club or organization: Not on file    Attends meetings of clubs or organizations: Not on file    Relationship status: Not on file  Other Topics Concern  . Not on file  Social History Narrative   Married, wife moving to [place]   Retired , prior occupation self  Patient is a former smoker   Alcohol use - yes   Regular exercise   Drug Use- no    caffeine 3 cups coffee daily     Family History:  The patient's family history includes Aneurysm in his father; Cancer in his mother; Cystic fibrosis in his brother; Heart attack in his mother; Heart disease in his father.   ROS:   Please see the history of present illness.    ROS All other systems reviewed and are negative.   PHYSICAL EXAM:   VS:  BP 124/78   Pulse 83   Ht 5\' 11"  (1.803 m)   Wt 292 lb 12.8 oz (132.8 kg)   SpO2 94%   BMI 40.84 kg/m    GEN: Well nourished, well developed, in no acute distress  HEENT: normal  Neck: no JVD, carotid bruits, or masses Cardiac: RRR; no murmurs, rubs, or gallops, trace to 1 + BL edema L > R Respiratory:  clear to auscultation bilaterally, normal work of breathing GI: soft, nontender, nondistended, + BS MS: no deformity or atrophy  Skin: warm and dry, no rash Neuro:  Alert and Oriented x 3, Strength and sensation are intact Psych: euthymic mood, full affect  Wt Readings from Last 3 Encounters:  04/24/19 292 lb 12.8 oz (132.8 kg)  04/09/19 295 lb 12.8 oz (134.2 kg)  04/04/19 295 lb 3.2 oz (133.9 kg)      Studies/Labs Reviewed:   EKG:  EKG is ordered today.  The ekg ordered today demonstrates NSR, RBBB  Recent Labs: 12/05/2018: ALT 32; Hemoglobin 12.2; Platelets 135 04/09/2019: BUN 29; Creatinine, Ser 1.10; NT-Pro BNP 365; Potassium 4.9; Sodium 142   Lipid Panel    Component Value Date/Time   CHOL 136 04/20/2018 0853   TRIG 69 04/20/2018 0853   HDL 42 04/20/2018 0853   CHOLHDL 3.2 04/20/2018 0853   CHOLHDL 3.3 11/13/2015 1107   VLDL 26 11/13/2015 1107    LDLCALC 80 04/20/2018 0853    Additional studies/ records that were reviewed today include:   Cardiac Catheterization: 05/2014  ANGIOGRAPHY:  The left main coronary artery was angiographically normal short vessel which immediately bifurcated into the LAD and left circumflex coronary artery.   The LAD was a large caliber vessel. The previously placed stent in the proximal LAD was widely patent. The LAD gave rise to several diagonal branches and septal perforating arteries and extended to the LV apex and was otherwise free of significant obstructive disease..   The left circumflex coronary artery was angiographically normal and gave rise to one major bifurcating obtuse marginal branch.   The RCA was a large dominant vessel that had mild 20-30% proximal narrowing, luminal irregularities in the midsegment with 20-30% eccentric stenosis and a subtotally occluded 99% stenosis proximal to an acute marginal branch before the acute margin. The RCA supplied a large PDA and PLA vessel.   Left ventriculography revealed normal global LV contractility without focal segmental wall motion abnormalities. There was no evidence for mitral regurgitation. Ejection fraction was 60%.  Following percutaneous coronary intervention of the RCA with ultimate insertion of a 4.018 mm Xience Alpine DES stent post dilated to 4.49 mm, the 99% stenosis was reduced to 0%. There was brisk TIMI-3 flow. There was no evidence for dissection.   Total contrast used: 220 cc Omipaque  IMPRESSION:  Significant coronary obstructive disease with a widely patent previously placed 4.018 mm bare-metal stent in the proximal LAD without restenosis in an otherwise normal LAD and circumflex  system. Dominant RCA with 20-30% proximal areas of narrowing with luminal irregularity, 20-30% eccentric narrowing segment followed by 99% stenosis proximal to the acute margin.  Normal LV function without focal wall motion  abnormalities and an ejection fraction of 60%.  Successful PCI to the subtotal occlusion of the RCA with 99% stenosis being reduced to 0% with ultimate insertion of a Xience Alpine 4.018 mm DES stent post dilated to 4.49 mm.   RECOMMENDATION:  Patient will require dual antiplatelet therapy for minimum of a year in addition to aggressive lipid-lowering therapy, ACE inhibitor, and beta blocker therapy. Weight loss and increased exercise will be recommended.    ASSESSMENT & PLAN:    1. LE edema/orthopnea/PND -Improved. Now off prednisone. Meds changes as below.   2. CAD -No angina.  Continue aspirin, statin and beta-blocker.  3. HTN -Blood pressure stable and well-controlled on current medication.  4. HLD - 04/20/2018: Cholesterol, Total 136; HDL 42; LDL Calculated 80; Triglycerides 69  -Continue statin.    Medication Adjustments/Labs and Tests Ordered: Current medicines are reviewed at length with the patient today.  Concerns regarding medicines are outlined above.  Medication changes, Labs and Tests ordered today are listed in the Patient Instructions below. Patient Instructions  Medication Instructions:  Your physician has recommended you make the following change in your medication:  1.  TAKE Lasix 40 mg daily for 3 days then reduce it to ONLY AS NEEDED FOR EDEMA 2.  TAKE Potassium 10 meq daily for 3 days then reduce it to [place] A LASIX  If you need a refill on your cardiac medications before your next appointment, please call your pharmacy.   Lab work: TODAY:  BMET  If you have labs (blood work) drawn today and your tests are completely normal, you will receive your results only by: Marland Kitchen MyChart Message (if you have MyChart) OR . A paper copy in the mail If you have any lab test that is abnormal or we need to change your treatment, we will call you to review the results.  Testing/Procedures: None ordered  Follow-Up: At [facility], you and your health needs are our priority.  As part of our continuing mission to provide you with exceptional heart care, we have created designated Provider Care Teams.  These Care Teams include your primary Cardiologist (physician) and Advanced Practice Providers (APPs -  Physician Assistants and Nurse Practitioners) who all work together to provide you with the care you need, when you need it. You will need a follow up appointment in:  6 MONTHS.  Please call our office 2 months in advance to schedule this appointment.  You may see Dorris Carnes, MD or one of the following Advanced Practice Providers on your designated Care Team: Richardson Dopp, PA-C [place], [place] . Daune Perch, NP  Any Other Special Instructions Will Be Listed Below (If Applicable).       Jarrett Soho, [place]  04/24/2019 11:00 AM    [facility] [address], [place], [place]  [address] Phone: [phone]; Fax: [phone]

## 2019-04-24 ENCOUNTER — Ambulatory Visit (INDEPENDENT_AMBULATORY_CARE_PROVIDER_SITE_OTHER): Payer: Medicare Other | Admitting: Physician Assistant

## 2019-04-24 ENCOUNTER — Other Ambulatory Visit: Payer: Self-pay

## 2019-04-24 ENCOUNTER — Encounter: Payer: Self-pay | Admitting: Physician Assistant

## 2019-04-24 VITALS — BP 124/78 | HR 83 | Ht 71.0 in | Wt 292.8 lb

## 2019-04-24 DIAGNOSIS — R600 Localized edema: Secondary | ICD-10-CM

## 2019-04-24 DIAGNOSIS — I251 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: Secondary | ICD-10-CM | POA: Diagnosis not present

## 2019-04-24 DIAGNOSIS — I10 Essential (primary) hypertension: Secondary | ICD-10-CM | POA: Diagnosis not present

## 2019-04-24 LAB — BASIC METABOLIC PANEL
BUN/Creatinine Ratio: 28 — ABNORMAL HIGH (ref 10–24)
BUN: 36 mg/dL — ABNORMAL HIGH (ref 8–27)
CO2: 26 mmol/L (ref 20–29)
Calcium: 9.3 mg/dL (ref 8.6–10.2)
Chloride: 100 mmol/L (ref 96–106)
Creatinine, Ser: 1.3 mg/dL — ABNORMAL HIGH (ref 0.76–1.27)
GFR calc Af Amer: 64 mL/min/{1.73_m2} (ref >59)
GFR calc non Af Amer: 55 mL/min/{1.73_m2} — ABNORMAL LOW (ref >59)
Glucose: 88 mg/dL (ref 65–99)
Potassium: 5.3 mmol/L — ABNORMAL HIGH (ref 3.5–5.2)
Sodium: 139 mmol/L (ref 134–144)

## 2019-04-24 MED ORDER — POTASSIUM CHLORIDE ER 10 MEQ PO TBCR: EXTENDED_RELEASE_TABLET | ORAL | 3 refills | Status: DC

## 2019-04-24 MED ORDER — FUROSEMIDE 40 MG PO TABS: ORAL_TABLET | ORAL | 3 refills | Status: DC

## 2019-04-24 NOTE — Patient Instructions (Addendum)
Medication Instructions:  Your physician has recommended you make the following change in your medication:  1.  TAKE Lasix 40 mg daily for 3 days then reduce it to ONLY AS NEEDED FOR EDEMA 2.  TAKE Potassium 10 meq daily for 3 days then reduce it to [place] A LASIX  If you need a refill on your cardiac medications before your next appointment, please call your pharmacy.   Lab work: TODAY:  BMET  If you have labs (blood work) drawn today and your tests are completely normal, you will receive your results only by: Marland Kitchen MyChart Message (if you have MyChart) OR . A paper copy in the mail If you have any lab test that is abnormal or we need to change your treatment, we will call you to review the results.  Testing/Procedures: None ordered  Follow-Up: At [facility], you and your health needs are our priority.  As part of our continuing mission to provide you with exceptional heart care, we have created designated Provider Care Teams.  These Care Teams include your primary Cardiologist (physician) and Advanced Practice Providers (APPs -  Physician Assistants and Nurse Practitioners) who all work together to provide you with the care you need, when you need it. You will need a follow up appointment in:  6 MONTHS.  Please call our office 2 months in advance to schedule this appointment.  You may see Dorris Carnes, MD or one of the following Advanced Practice Providers on your designated Care Team: Richardson Dopp, PA-C [place], [place] . Daune Perch, NP  Any Other Special Instructions Will Be Listed Below (If Applicable).

## 2019-04-25 ENCOUNTER — Telehealth: Payer: Self-pay | Admitting: *Deleted

## 2019-04-25 DIAGNOSIS — D0462 Carcinoma in situ of skin of left upper limb, including shoulder: Secondary | ICD-10-CM | POA: Diagnosis not present

## 2019-04-25 DIAGNOSIS — Z79899 Other long term (current) drug therapy: Secondary | ICD-10-CM

## 2019-04-25 DIAGNOSIS — L821 Other seborrheic keratosis: Secondary | ICD-10-CM | POA: Diagnosis not present

## 2019-04-25 DIAGNOSIS — L814 Other melanin hyperpigmentation: Secondary | ICD-10-CM | POA: Diagnosis not present

## 2019-04-25 DIAGNOSIS — D1801 Hemangioma of skin and subcutaneous tissue: Secondary | ICD-10-CM | POA: Diagnosis not present

## 2019-04-25 DIAGNOSIS — L570 Actinic keratosis: Secondary | ICD-10-CM | POA: Diagnosis not present

## 2019-04-25 NOTE — Telephone Encounter (Signed)
Called pt re: elevated K+.  Pt will hold K+ supplement 04/26/2019 and come in for repeat BMET.

## 2019-04-26 ENCOUNTER — Other Ambulatory Visit: Payer: Medicare Other | Admitting: *Deleted

## 2019-04-26 ENCOUNTER — Other Ambulatory Visit: Payer: Self-pay

## 2019-04-26 DIAGNOSIS — Z79899 Other long term (current) drug therapy: Secondary | ICD-10-CM

## 2019-04-26 DIAGNOSIS — G7241 Inclusion body myositis [IBM]: Secondary | ICD-10-CM | POA: Diagnosis not present

## 2019-04-26 LAB — BASIC METABOLIC PANEL
BUN/Creatinine Ratio: 29 — ABNORMAL HIGH (ref 10–24)
BUN: 30 mg/dL — ABNORMAL HIGH (ref 8–27)
CO2: 27 mmol/L (ref 20–29)
Calcium: 9.4 mg/dL (ref 8.6–10.2)
Chloride: 101 mmol/L (ref 96–106)
Creatinine, Ser: 1.04 mg/dL (ref 0.76–1.27)
GFR calc Af Amer: 84 mL/min/{1.73_m2} (ref >59)
GFR calc non Af Amer: 72 mL/min/{1.73_m2} (ref >59)
Glucose: 94 mg/dL (ref 65–99)
Potassium: 4.9 mmol/L (ref 3.5–5.2)
Sodium: 139 mmol/L (ref 134–144)

## 2019-05-04 ENCOUNTER — Other Ambulatory Visit: Payer: Self-pay

## 2019-05-04 ENCOUNTER — Encounter: Payer: Self-pay | Admitting: Cardiology

## 2019-05-04 ENCOUNTER — Ambulatory Visit (INDEPENDENT_AMBULATORY_CARE_PROVIDER_SITE_OTHER): Payer: Medicare Other | Admitting: Cardiology

## 2019-05-04 VITALS — BP 130/62 | HR 75 | Ht 71.0 in | Wt 293.1 lb

## 2019-05-04 DIAGNOSIS — E875 Hyperkalemia: Secondary | ICD-10-CM

## 2019-05-04 DIAGNOSIS — T50905A Adverse effect of unspecified drugs, medicaments and biological substances, initial encounter: Secondary | ICD-10-CM

## 2019-05-04 DIAGNOSIS — E785 Hyperlipidemia, unspecified: Secondary | ICD-10-CM

## 2019-05-04 DIAGNOSIS — R600 Localized edema: Secondary | ICD-10-CM

## 2019-05-04 DIAGNOSIS — I251 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: Secondary | ICD-10-CM | POA: Diagnosis not present

## 2019-05-04 DIAGNOSIS — I10 Essential (primary) hypertension: Secondary | ICD-10-CM

## 2019-05-04 NOTE — Patient Instructions (Addendum)
Medication Instructions:  Take Lasix every day until your swelling goes down then use only as needed   If you need a refill on your cardiac medications before your next appointment, please call your pharmacy.   Lab work: None   If you have labs (blood work) drawn today and your tests are completely normal, you will receive your results only by: Marland Kitchen MyChart Message (if you have MyChart) OR . A paper copy in the mail If you have any lab test that is abnormal or we need to change your treatment, we will call you to review the results.  Testing/Procedures: None   Follow-Up: At [facility], you and your health needs are our priority.  As part of our continuing mission to provide you with exceptional heart care, we have created designated Provider Care Teams.  These Care Teams include your primary Cardiologist (physician) and Advanced Practice Providers (APPs -  Physician Assistants and Nurse Practitioners) who all work together to provide you with the care you need, when you need it. You will need a follow up appointment in:  3 months.  Please call our office 2 months in advance to schedule this appointment.  You may see Dorris Carnes, MD or one of the following Advanced Practice Providers on your designated Care Team: Richardson Dopp, PA-C [place], [place] . Daune Perch, NP  Any Other Special Instructions Will Be Listed Below (If Applicable).   Lifestyle Modifications to Prevent and Treat Heart Disease -Recommend heart healthy/Mediterranean diet, with whole grains, fruits, vegetables, fish, lean meats, nuts, olive oil and avocado oil.  -Limit salt intake to less than 2000 mg per day.  -Recommend moderate walking, starting slowly with a few minutes and working up to 3-5 times/week for 30-50 minutes each session. Aim for at least 150 minutes.week. Goal should be pace of 3 miles/hours, or walking 1.5 miles in 30 minutes -Recommend avoidance of tobacco products. Avoid excess alcohol. -Keep blood  pressure well controlled, ideally less than 130/80.    Edema  Edema is when you have too much fluid in your body or under your skin. Edema may make your legs, feet, and ankles swell up. Swelling is also common in looser tissues, like around your eyes. This is a common condition. It gets more common as you get older. There are many possible causes of edema. Eating too much salt (sodium) and being on your feet or sitting for a long time can cause edema in your legs, feet, and ankles. Hot weather may make edema worse. Edema is usually painless. Your skin may look swollen or shiny. Follow these instructions at home:  Keep the swollen body part raised (elevated) above the level of your heart when you are sitting or lying down.  Do not sit still or stand for a long time.  Do not wear tight clothes. Do not wear garters on your upper legs.  Exercise your legs. This can help the swelling go down.  Wear elastic bandages or support stockings as told by your doctor.  Eat a low-salt (low-sodium) diet to reduce fluid as told by your doctor.  Depending on the cause of your swelling, you may need to limit how much fluid you drink (fluid restriction).  Take over-the-counter and prescription medicines only as told by your doctor. Contact a doctor if:  Treatment is not working.  You have heart, liver, or kidney disease and have symptoms of edema.  You have sudden and unexplained weight gain. Get help right away if:  You have shortness of breath or chest pain.  You cannot breathe when you lie down.  You have pain, redness, or warmth in the swollen areas.  You have heart, liver, or kidney disease and get edema all of a sudden.  You have a fever and your symptoms get worse all of a sudden. Summary  Edema is when you have too much fluid in your body or under your skin.  Edema may make your legs, feet, and ankles swell up. Swelling is also common in looser tissues, like around your  eyes.  Raise (elevate) the swollen body part above the level of your heart when you are sitting or lying down.  Follow your doctor's instructions about diet and how much fluid you can drink (fluid restriction). This information is not intended to replace advice given to you by your health care provider. Make sure you discuss any questions you have with your health care provider. Document Released: 12/29/2007 Document Revised: 07/15/2017 Document Reviewed: 07/30/2016 Elsevier Patient Education  2020 [name].

## 2019-05-04 NOTE — Progress Notes (Signed)
Cardiology Office Note:    Date:  05/04/2019   ID:  [name], DOB [birth year], MRN [id number]  PCP:  Esaw Grandchild, NP  Cardiologist:  Dorris Carnes, MD  Referring MD: Esaw Grandchild, NP   Chief Complaint  Patient presents with   Edema    History of Present Illness:    [name] is a 68 y.o. adult with a past medical history significant for CAD s/p stent to LAD in 2009 and DES to RCA in 2015, hypertension, hyperlipidemia, polymyositis and spinal stenosis.  Was recently seen in the office on 04/09/2019 by Robbie Lis, PA for peripheral edema.  Felt likely related to recently starting prednisone for myositis.  He was started on diuretics, short-term.  He was seen back on 04/24/2019 and noted to be improving.  He was advised to take Lasix 40 mg daily for 3 days and then only as needed.  Potassium was ordered to be taken only with Lasix.  Follow-up potassium was slightly elevated at 5.3 and patient was advised to stop taking potassium.  Follow-up potassium 3 days later was back to normal at 4.9.  The patient was noted to have moved to [place] from [place] in 2014.  He was treated with IVIG for polymyositis.  Seen by neurologist Dr. Leta Baptist and at [facility].  The patient is actually not sure why he is being seen back in the clinic today.  He notes that he was having no swelling prior to being on the prednisone. Pt is here today alone. He has been off prednisone for 2 weeks. His leg edema is better but still present.  He continues to take Lasix. 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 Breathing is at his baseline.    Cardiac studies   Cardiac Catheterization:05/2014  ANGIOGRAPHY:  The left main coronary artery was angiographically normal short vessel which immediately bifurcated into the LAD and left circumflex coronary artery.   The LAD was a large caliber vessel. The previously placed stent in the proximal LAD was widely patent. The LAD gave rise to several  diagonal branches and septal perforating arteries and extended to the LV apex and was otherwise free of significant obstructive disease..   The left circumflex coronary artery was angiographically normal and gave rise to one major bifurcating obtuse marginal branch.   The RCA was a large dominant vessel that had mild 20-30% proximal narrowing, luminal irregularities in the midsegment with 20-30% eccentric stenosis and a subtotally occluded 99% stenosis proximal to an acute marginal branch before the acute margin. The RCA supplied a large PDA and PLA vessel.   Left ventriculography revealed normal global LV contractility without focal segmental wall motion abnormalities. There was no evidence for mitral regurgitation. Ejection fraction was 60%.  Following percutaneous coronary intervention of the RCA with ultimate insertion of a 4.018 mm Xience Alpine DES stent post dilated to 4.49 mm, the 99% stenosis was reduced to 0%. There was brisk TIMI-3 flow. There was no evidence for dissection.   Total contrast used: 220 cc Omipaque  IMPRESSION:  Significant coronary obstructive disease with a widely patent previously placed 4.018 mm bare-metal stent in the proximal LAD without restenosis in an otherwise normal LAD and circumflex system. Dominant RCA with 20-30% proximal areas of narrowing with luminal irregularity, 20-30% eccentric narrowing segment followed by 99% stenosis proximal to the acute margin.  Normal LV function without focal wall motion abnormalities and an ejection fraction of 60%.  Successful PCI to the subtotal occlusion  of the RCA with 99% stenosis being reduced to 0% with ultimate insertion of a Xience Alpine 4.018 mm DES stent post dilated to 4.49 mm.   RECOMMENDATION:  Patient will require dual antiplatelet therapy for minimum of a year in addition to aggressive lipid-lowering therapy, ACE inhibitor, and beta blocker therapy. Weight loss and increased exercise  will be recommended.    Past Medical History:  Diagnosis Date   Coronary artery disease    a. s/p BMS to LAD (1998) b. s/p DES to RCA (06/24/14)   Dyspnea    HTN (hypertension)    Hyperlipidemia    Myositis    chronic CK elevation ( 415 CK, ALL MM CK subtype, seen by Rheumatology and considered normal Variant)   Palatal fistula    Palpable abd. aorta    Pneumonia    RBBB    Spinal stenosis    Dr Vaughan Sine  surgery   Weakness     Past Surgical History:  Procedure Laterality Date   CERVICAL FUSION  09/2009   CORONARY ANGIOPLASTY     CORONARY STENT PLACEMENT  06/24/2014   DES to RCA   [place] WITH CORONARY ANGIOGRAM N/A 06/24/2014   Procedure: LEFT HEART CATHETERIZATION WITH CORONARY ANGIOGRAM;  Surgeon: Troy Sine, MD;  Location: [facility] CATH LAB;  Service: Cardiovascular;  Laterality: N/A;   LUMBAR SPINE SURGERY  5/13   MUSCLE BIOPSY Left 07/11/2018   Procedure: Left Vastus lateralus muscle biopsy;  Surgeon: Judith Part, MD;  Location: [place];  Service: Neurosurgery;  Laterality: Left;  Left Vastus lateralus muscle biopsy   PERCUTANEOUS CORONARY STENT INTERVENTION (PCI-S)  06/24/2014   Procedure: PERCUTANEOUS CORONARY STENT INTERVENTION (PCI-S);  Surgeon: Troy Sine, MD;  Location: [facility] CATH LAB;  Service: Cardiovascular;;  Distal RCA    Current Medications: Current Meds  Medication Sig   aspirin EC 81 MG EC tablet Take 1 tablet (81 mg total) by mouth daily.   atenolol (TENORMIN) 25 MG tablet Take 1 tablet (25 mg total) by mouth 2 (two) times daily.   atorvastatin (LIPITOR) 10 MG tablet Take 1 tablet (10 mg total) by mouth daily.   b complex vitamins tablet Take 1 tablet by mouth daily.   betamethasone dipropionate (DIPROLENE) 0.05 % cream Apply 1 application topically every other day.   cholecalciferol (VITAMIN D) 1000 units tablet Take 1,000 Units by mouth daily.    Coenzyme Q10 (CO Q 10) 100 MG CAPS Take 100 mg by mouth daily.    diphenhydrAMINE (BENADRYL) 25 MG tablet Take 50 mg by mouth at bedtime.   folic acid (FOLVITE) Q000111Q MCG tablet Take 800 mcg by mouth daily.    furosemide (LASIX) 40 MG tablet TAKE 1 TABLET BY MOUTH FOR 3 DAYS THEN TAKE ONLY AS NEEDED FOR EDEMA   glucosamine-chondroitin 500-400 MG tablet Take 2 tablets by mouth daily.    ibuprofen (ADVIL,MOTRIN) 800 MG tablet Take 800 mg by mouth daily.    Krill Oil 500 MG CAPS Take 500 mg by mouth daily.    lisinopril (ZESTRIL) 10 MG tablet Take 1 tablet by mouth once daily   Melatonin 10 MG TABS Take 20 mg by mouth at bedtime.   nitroGLYCERIN (NITROSTAT) 0.4 MG SL tablet Place 1 tablet (0.4 mg total) under the tongue every 5 (five) minutes as needed for chest pain.   potassium chloride (K-DUR) 10 MEQ tablet TAKE 1  TABLET BY MOUTH FOR 3 DAYS THEN TAKE ONLY AS NEEDED WHEN YOU TAKE A LASIX   Saw Palmetto, Serenoa repens, (SAW PALMETTO PO) Take 300 mg by mouth daily.   triamcinolone cream (KENALOG) 0.1 % Apply 1 application topically as directed.     Allergies:   Patient has no known allergies.   Social History   Socioeconomic History   Marital status: Unknown    Spouse name: Not on file   Number of children: Not on file   Years of education: Not on file   Highest education level: Not on file  Occupational History   Not on file  Social Needs   Financial resource strain: Not on file   Food insecurity    Worry: Not on file    Inability: Not on file   Transportation needs    Medical: Not on file    Non-medical: Not on file  Tobacco Use   Smoking status: Former Smoker    Packs/day: 0.75    Years: 23.00    Pack years: 17.25    Types: Cigarettes    Quit date: 07/26/1989    Years since quitting: 29.7   Smokeless tobacco: Never Used  Substance and Sexual Activity   Alcohol use: Yes    Comment: 3-6 cans per week   Drug use: No   Sexual activity: Yes    Birth  control/protection: None  Lifestyle   Physical activity    Days per week: Not on file    Minutes per session: Not on file   Stress: Not on file  Relationships   Social connections    Talks on phone: Not on file    Gets together: Not on file    Attends religious service: Not on file    Active member of club or organization: Not on file    Attends meetings of clubs or organizations: Not on file    Relationship status: Not on file  Other Topics Concern   Not on file  Social History Narrative   Married, wife moving to [place]   Retired , prior occupation self   Patient is a former smoker   Alcohol use - yes   Regular exercise   Drug Use- no    caffeine 3 cups coffee daily     Family History: The patient's family history includes Aneurysm in his father; Cancer in his mother; Cystic fibrosis in his brother; Heart attack in his mother; Heart disease in his father. There is no history of Stroke. ROS:   Please see the history of present illness.     All other systems reviewed and are negative.   EKG:  EKG is not ordered today.   Recent Labs: 12/05/2018: ALT 32; Hemoglobin 12.2; Platelets 135 04/09/2019: NT-Pro BNP 365 04/26/2019: BUN 30; Creatinine, Ser 1.04; Potassium 4.9; Sodium 139   Recent Lipid Panel    Component Value Date/Time   CHOL 136 04/20/2018 0853   TRIG 69 04/20/2018 0853   HDL 42 04/20/2018 0853   CHOLHDL 3.2 04/20/2018 0853   CHOLHDL 3.3 11/13/2015 1107   VLDL 26 11/13/2015 1107   LDLCALC 80 04/20/2018 0853    Physical Exam:    VS:  BP 130/62    Pulse 75    Ht 5\' 11"  (1.803 m)    Wt 293 lb 1.9 oz (133 kg)    SpO2 98%    BMI 40.88 kg/m     Wt Readings from Last 6 Encounters:  05/04/19 293 lb 1.9  oz (133 kg)  04/24/19 292 lb 12.8 oz (132.8 kg)  04/09/19 295 lb 12.8 oz (134.2 kg)  04/04/19 295 lb 3.2 oz (133.9 kg)  07/20/18 266 lb 8 oz (120.9 kg)  07/10/18 264 lb 1.6 oz (119.8 kg)     Physical Exam  Constitutional: He is oriented to person, place,  and time. No distress.  Patient walks with a Rollator  HENT:  Head: Normocephalic and atraumatic.  Neck: Normal range of motion. Neck supple. No JVD present.  Cardiovascular: Normal rate, regular rhythm, normal heart sounds and intact distal pulses. Exam reveals no gallop and no friction rub.  No murmur heard. Pulmonary/Chest: Effort normal and breath sounds normal. No respiratory distress. He has no wheezes. He has no rales.  Abdominal: Soft. Bowel sounds are normal.  Musculoskeletal: Normal range of motion.        General: Edema present.     Comments: Bilateral 1+ lower leg edema up to the sock line  Neurological: He is alert and oriented to person, place, and time.  Skin: Skin is warm and dry.  Psychiatric: He has a normal mood and affect. His behavior is normal. Judgment and thought content normal.  Vitals reviewed.    ASSESSMENT:    1. Bilateral lower extremity edema   2. Drug-induced hyperkalemia   3. Coronary artery disease involving native coronary artery of native heart without angina pectoris   4. Essential (primary) hypertension   5. Hyperlipidemia, unspecified hyperlipidemia type    PLAN:    In order of problems listed above:  Lower extremity edema/orthopnea/PND -Possibly related to initiation of prednisone for myositis.  Now off prednisone. -Improved with use of Lasix but still having lower leg edema. -Continue lasix 40 mg daily until edema resolved.  Then use as needed. -Advised to elevate feet as much as possible. Limit salt intake.   Hyperkalemia -Patient was given oral potassium to use with Lasix.  Potassium came back at 5.3.  Potassium has since been discontinued.  He is on lisinopril 10 mg daily.  Follow-up potassium was normal.  CAD -No angina. -Continues on aspirin, statin and beta-blocker.  Hypertension -BP well controlled.   Hyperlipidemia, goal LDL <70 -On Lipitor 10 mg daily.  LDL was 80 in 03/2018 -We will update lipid  panel  Myositis -Seeing neurology.  Patient has trouble closing his hands due to the myositis.  Patient states that in the past it was thought that could be related to statin but discontinuing the statin did not have any effect.  He now continues on low-dose statin.   Medication Adjustments/Labs and Tests Ordered: Current medicines are reviewed at length with the patient today.  Concerns regarding medicines are outlined above. Labs and tests ordered and medication changes are outlined in the patient instructions below:  Patient Instructions  Medication Instructions:  Take Lasix every day until your swelling goes down then use only as needed   If you need a refill on your cardiac medications before your next appointment, please call your pharmacy.   Lab work: None   If you have labs (blood work) drawn today and your tests are completely normal, you will receive your results only by:  [place] (if you have MyChart) OR  A paper copy in the mail If you have any lab test that is abnormal or we need to change your treatment, we will call you to review the results.  Testing/Procedures: None   Follow-Up: At [facility], you and your health needs are our  priority.  As part of our continuing mission to provide you with exceptional heart care, we have created designated Provider Care Teams.  These Care Teams include your primary Cardiologist (physician) and Advanced Practice Providers (APPs -  Physician Assistants and Nurse Practitioners) who all work together to provide you with the care you need, when you need it. You will need a follow up appointment in:  3 months.  Please call our office 2 months in advance to schedule this appointment.  You may see Dorris Carnes, MD or one of the following Advanced Practice Providers on your designated Care Team: Richardson Dopp, PA-C [place], [place]  Daune Perch, NP  Any Other Special Instructions Will Be Listed Below (If  Applicable).   Lifestyle Modifications to Prevent and Treat Heart Disease -Recommend heart healthy/Mediterranean diet, with whole grains, fruits, vegetables, fish, lean meats, nuts, olive oil and avocado oil.  -Limit salt intake to less than 2000 mg per day.  -Recommend moderate walking, starting slowly with a few minutes and working up to 3-5 times/week for 30-50 minutes each session. Aim for at least 150 minutes.week. Goal should be pace of 3 miles/hours, or walking 1.5 miles in 30 minutes -Recommend avoidance of tobacco products. Avoid excess alcohol. -Keep blood pressure well controlled, ideally less than 130/80.    Edema  Edema is when you have too much fluid in your body or under your skin. Edema may make your legs, feet, and ankles swell up. Swelling is also common in looser tissues, like around your eyes. This is a common condition. It gets more common as you get older. There are many possible causes of edema. Eating too much salt (sodium) and being on your feet or sitting for a long time can cause edema in your legs, feet, and ankles. Hot weather may make edema worse. Edema is usually painless. Your skin may look swollen or shiny. Follow these instructions at home:  Keep the swollen body part raised (elevated) above the level of your heart when you are sitting or lying down.  Do not sit still or stand for a long time.  Do not wear tight clothes. Do not wear garters on your upper legs.  Exercise your legs. This can help the swelling go down.  Wear elastic bandages or support stockings as told by your doctor.  Eat a low-salt (low-sodium) diet to reduce fluid as told by your doctor.  Depending on the cause of your swelling, you may need to limit how much fluid you drink (fluid restriction).  Take over-the-counter and prescription medicines only as told by your doctor. Contact a doctor if:  Treatment is not working.  You have heart, liver, or kidney disease and have symptoms  of edema.  You have sudden and unexplained weight gain. Get help right away if:  You have shortness of breath or chest pain.  You cannot breathe when you lie down.  You have pain, redness, or warmth in the swollen areas.  You have heart, liver, or kidney disease and get edema all of a sudden.  You have a fever and your symptoms get worse all of a sudden. Summary  Edema is when you have too much fluid in your body or under your skin.  Edema may make your legs, feet, and ankles swell up. Swelling is also common in looser tissues, like around your eyes.  Raise (elevate) the swollen body part above the level of your heart when you are sitting or lying down.  Follow your  doctor's instructions about diet and how much fluid you can drink (fluid restriction). This information is not intended to replace advice given to you by your health care provider. Make sure you discuss any questions you have with your health care provider. Document Released: 12/29/2007 Document Revised: 07/15/2017 Document Reviewed: 07/30/2016 Elsevier Patient Education  2020 [place], Daune Perch, NP  05/04/2019 6:46 PM    [place]

## 2019-05-15 NOTE — Progress Notes (Signed)
Subjective:    Patient ID: [name], adult    DOB: 01/16/1951, 68 y.o.   MRN: [id number]  HPI:  04/04/2019 OV:  [name] is here to discuss recent treatments for body myositis, increase in lower ext edema, increase in fatigue, and increase in pain levels (lower back and lower ext) . 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 I went over the issues related to this diagnosis and the prognosis as well as ongoing research studies. I have asked him to taper the prednisone by 10 mg every 2 weeks over the next 6 weeks. I will follow up with him when he is on 10 mg in 6 weeks time at that point given the slightly slower taper to continue to finish tapering off of the prednisone".  Discussed the nature of body myositis-progressive muscle disorder characterized by muscle inflammation, weakness, and atrophy. He would like another referral to local Neurologist.  He reports increase in lower back pain- he would like referral to new pain clinic.  He denies CP/chest tightness with exertion.  He denies any use of Nitro  He has gained >30 lbs since Dec 2019- some gain likely r/t to prednisone use  05/16/2019 OV: [name] is here for CPE. He continues to experience lower ext edema, L>R. He states "I am not sure how much water I drink, I sip on a bottle or two hroughout the day". He drinks 3 lite beers + "I chase down it down with bourbon"- 2-3 times/week. He will resume Nutri-Sytem diet next month. He will f/u with [place] based Neurologist next month to address poly myositis. 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  04/13/2019 ECHO EF 60-65% and BMP 04/26/2019 reviewed with pt.  He denies acute cardiac sx's.  He reports poor sleep. He had Sleep Study in early 2000s that per pt was "inconclusive". He denies snoring. He reports daytime drowsiness. He has been off prednisone since 26 Apr 2019 He vehemently declined Sleep Study. He has been using OTC Benadryl and Melatonin with only minimal sx relief.  Healthcare Maintenance: Colonoscopy-Last 2012, repeat 2022 Immunizations-declined pneumoccoal vaccination AAA Screening-ordered LDCT-N/A- smoking hx does not warrant imaging    Patient Care Team    Relationship Specialty Notifications Start End  Mina Marble D, NP PCP - General Family Medicine  02/20/18   [name], MD PCP - Cardiology Cardiology Admissions 06/19/18   [name], MD Consulting Physician Cardiology  02/15/18   Harriett Sine, MD Consulting Physician Dermatology  02/15/18     Patient Active Problem List   Diagnosis Date Noted  . Insomnia 05/16/2019  . Bilateral lower extremity edema 04/04/2019  . BMI 37.0-37.9, adult 07/20/2018  . Encounter for Medicare annual wellness exam 04/27/2018  . Colon cancer screening 04/27/2018  . Flu vaccine need 04/27/2018  . Healthcare maintenance 02/15/2018  . Stented coronary artery   . Myositis   . Hyperlipidemia   . Coronary artery disease   . Spinal stenosis   . Weakness   . HTN (hypertension)      Past Medical History:  Diagnosis Date  . Coronary artery disease  a. s/p BMS to LAD (1998) b. s/p DES to RCA (06/24/14)  . Dyspnea   . HTN (hypertension)   . Hyperlipidemia   . Myositis    chronic CK elevation ( 415 CK, ALL MM CK subtype, seen by Rheumatology and considered normal Variant)  . Palatal fistula   . Palpable abd. aorta   . Pneumonia   . RBBB   . Spinal stenosis    Dr Vaughan Sine  surgery  . Weakness      Past Surgical History:  Procedure Laterality Date  . CERVICAL FUSION  09/2009  . CORONARY  ANGIOPLASTY    . CORONARY STENT PLACEMENT  06/24/2014   DES to RCA  . EYE SURGERY     Lasik  . HYDROCELE EXCISION    . LEFT HEART CATHETERIZATION WITH CORONARY ANGIOGRAM N/A 06/24/2014   Procedure: LEFT HEART CATHETERIZATION WITH CORONARY ANGIOGRAM;  Surgeon: Troy Sine, MD;  Location: [facility] CATH LAB;  Service: Cardiovascular;  Laterality: N/A;  . LUMBAR SPINE SURGERY  5/13  . MUSCLE BIOPSY Left 07/11/2018   Procedure: Left Vastus lateralus muscle biopsy;  Surgeon: Judith Part, MD;  Location: [place];  Service: Neurosurgery;  Laterality: Left;  Left Vastus lateralus muscle biopsy  . PERCUTANEOUS CORONARY STENT INTERVENTION (PCI-S)  06/24/2014   Procedure: PERCUTANEOUS CORONARY STENT INTERVENTION (PCI-S);  Surgeon: Troy Sine, MD;  Location: [facility] CATH LAB;  Service: Cardiovascular;;  Distal RCA     Family History  Problem Relation Age of Onset  . Heart attack Mother   . Cancer Mother        esophageal  . Aneurysm Father   . Heart disease Father   . 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 Stroke Neg Hx      Social History   Substance and Sexual Activity  Drug Use No     Social History   Substance and Sexual Activity  Alcohol Use Yes   Comment: 3-6 cans per week     Social History   Tobacco Use  Smoking Status Former Smoker  . Packs/day: 0.75  . Years: 23.00  . Pack years: 17.25  . Types: Cigarettes  . Quit date: 07/26/1989  . Years since quitting: 29.8  Smokeless Tobacco Never Used     Outpatient Encounter Medications as of 05/16/2019  Medication Sig  . aspirin EC 81 MG EC tablet Take 1 tablet (81 mg total) by mouth daily.  Marland Kitchen atenolol (TENORMIN) 25 MG tablet Take 1 tablet (25 mg total) by mouth 2 (two) times daily.  Marland Kitchen atorvastatin (LIPITOR) 10 MG tablet Take 1 tablet (10 mg total) by mouth daily.  Marland Kitchen b complex vitamins tablet Take 1 tablet by mouth daily.  . betamethasone  dipropionate (DIPROLENE) 0.05 % cream Apply 1 application topically every other day.  . cholecalciferol (VITAMIN D) 1000 units tablet Take 1,000 Units by mouth daily.  . Coenzyme Q10 (CO Q 10) 100 MG CAPS Take 100 mg by mouth daily.   . diphenhydrAMINE (BENADRYL) 25 MG tablet Take 50 mg by mouth at bedtime.  . folic acid (FOLVITE) Q000111Q MCG tablet Take 800 mcg by mouth daily.   . furosemide (LASIX) 40 MG tablet TAKE 1 TABLET BY MOUTH FOR 3 DAYS THEN TAKE ONLY AS NEEDED FOR EDEMA  . glucosamine-chondroitin 500-400 MG tablet Take 2 tablets by mouth daily.   Marland Kitchen ibuprofen (ADVIL,MOTRIN) 800  MG tablet Take 800 mg by mouth daily.   Javier Docker Oil 500 MG CAPS Take 500 mg by mouth daily.   Marland Kitchen lisinopril (ZESTRIL) 10 MG tablet Take 1 tablet by mouth once daily  . Melatonin 10 MG TABS Take 20 mg by mouth at bedtime.  . nitroGLYCERIN (NITROSTAT) 0.4 MG SL tablet Place 1 tablet (0.4 mg total) under the tongue every 5 (five) minutes as needed for chest pain.  . potassium chloride (K-DUR) 10 MEQ tablet TAKE 1 TABLET BY MOUTH FOR 3 DAYS THEN TAKE ONLY AS NEEDED WHEN YOU TAKE A LASIX  . Saw Palmetto, Serenoa repens, (SAW PALMETTO PO) Take 300 mg by mouth daily.  Marland Kitchen triamcinolone cream (KENALOG) 0.1 % Apply 1 application topically as directed.  . traZODone (DESYREL) 50 MG tablet Take 0.5-1 tablets (25-50 mg total) by mouth at bedtime as needed for sleep.  Marland Kitchen Zoster Vaccine Adjuvanted [facility]) injection Inject 0.5 mLs into the muscle once for 1 dose.   No facility-administered encounter medications on file as of 05/16/2019.     Allergies: Patient has no known allergies.  Body mass index is 44.77 kg/m.  Blood pressure 114/66, pulse 73, temperature 98.3 F (36.8 C), temperature source Oral, height 5' 8.5" (1.74 m), weight 298 lb 12.8 oz (135.5 kg), SpO2 94 %.  Review of Systems  Constitutional: Positive for fatigue. Negative for activity change, appetite change, chills, diaphoresis, fever and unexpected weight  change.  Eyes: Negative for visual disturbance.  Respiratory: Negative for cough, chest tightness, shortness of breath, wheezing and stridor.   Cardiovascular: Positive for leg swelling. Negative for chest pain and palpitations.  Gastrointestinal: Negative for abdominal distention, anal bleeding, blood in stool, constipation, diarrhea, nausea and vomiting.  Endocrine: Negative for polydipsia, polyphagia and polyuria.  Genitourinary: Negative for difficulty urinating and flank pain.  Musculoskeletal: Positive for arthralgias, back pain, gait problem, joint swelling, myalgias, neck pain and neck stiffness.  Neurological: Positive for weakness. Negative for dizziness and headaches.  Hematological: Negative for adenopathy. Does not bruise/bleed easily.  Psychiatric/Behavioral: Negative for agitation, behavioral problems, confusion, decreased concentration, dysphoric mood, hallucinations, self-injury, sleep disturbance and suicidal ideas. The patient is not nervous/anxious and is not hyperactive.        Objective:   Physical Exam Vitals signs and nursing note reviewed. Exam conducted with a chaperone present.  Constitutional:      General: He is not in acute distress.    Appearance: He is obese. He is not ill-appearing, toxic-appearing or diaphoretic.  HENT:     Head: Normocephalic.     Right Ear: Decreased hearing noted.     Left Ear: Decreased hearing noted.     Ears:     Comments: Bil hearing aids     Nose: Nose normal. No congestion.     Mouth/Throat:     Mouth: Mucous membranes are moist.     Pharynx: No oropharyngeal exudate.  Eyes:     Extraocular Movements: Extraocular movements intact.     Conjunctiva/sclera: Conjunctivae normal.     Pupils: Pupils are equal, round, and reactive to light.  Neck:     Musculoskeletal: Normal range of motion and neck supple. No muscular tenderness.  Cardiovascular:     Rate and Rhythm: Normal rate and regular rhythm.     Pulses: Normal pulses.      Heart sounds: Normal heart sounds. No murmur. No friction rub. No gallop.   Pulmonary:     Effort: Pulmonary effort is normal. No respiratory distress.  Breath sounds: Normal breath sounds. No stridor. No wheezing, rhonchi or rales.  Chest:     Chest wall: No tenderness.  Abdominal:     General: Abdomen is protuberant. Bowel sounds are normal.     Tenderness: There is no abdominal tenderness.  Genitourinary:    Comments: Declined DRE/genital examination  Musculoskeletal:        General: Swelling present.     Right lower leg: Edema present.     Left lower leg: Edema present.  Skin:    General: Skin is warm.     Capillary Refill: Capillary refill takes less than 2 seconds.     Findings: No erythema.  Neurological:     Mental Status: He is alert.     Motor: Weakness present.     Coordination: Coordination abnormal.     Gait: Gait abnormal.     Deep Tendon Reflexes: Reflexes abnormal.  Psychiatric:        Mood and Affect: Mood normal.        Behavior: Behavior normal.        Thought Content: Thought content normal.        Judgment: Judgment normal.       Assessment & Plan:   1. Need for zoster vaccination   2. Need for influenza vaccination   3. Hyperlipidemia, unspecified hyperlipidemia type   4. Healthcare maintenance   5. Elevated glucose   6. Weakness   7. Screening for AAA (abdominal aortic aneurysm)   8. Insomnia, unspecified type   9. Hypertension, unspecified type   10. Bilateral lower extremity edema     Healthcare maintenance Continue all medications as directed. Please start Trazodone 50mg  1/2 to 1 tablet at night as needed for sleep. Please stop OTC Benadryl. Increase daily water intake, follow DASH diet. Elevate your lower extremities as often as you can. We will contact you with lab results. Continue follow-up with Cardiology and Neurology as directed. Follow-up 4 weeks TeleMedicine, re: Insomnia, started on Trazodone. Follow-up 6 months for  chronic medical conditions. Continue to social distance and wear a mask when in public.  Hyperlipidemia Direct LDL drawn today Currently on Atorvastatin 10mg  QD Goal LDL <70  Insomnia Please start Trazodone 50mg  1/2 to 1 tablet at night as needed for sleep. Please stop OTC Benadryl. Follow-up 4 weeks TeleMedicine, re: Insomnia, started on Trazodone.  HTN (hypertension) BP at goal 114/66, HR 73 Currently on Atenolol 25mg  BID, Linsinopril 10mg  QD  Bilateral lower extremity edema Elevate lower extremities as often as possible. DASH diet. Furosemide as directed by cards.    FOLLOW-UP:  Return in about 4 weeks (around 06/13/2019) for Insomnia, Evaluate Medication Effectiveness.

## 2019-05-16 ENCOUNTER — Encounter: Payer: Self-pay | Admitting: Adult Health

## 2019-05-16 ENCOUNTER — Other Ambulatory Visit: Payer: Self-pay

## 2019-05-16 ENCOUNTER — Ambulatory Visit (INDEPENDENT_AMBULATORY_CARE_PROVIDER_SITE_OTHER): Payer: Medicare Other | Admitting: Adult Health

## 2019-05-16 VITALS — BP 114/66 | HR 73 | Temp 98.3°F | Ht 68.5 in | Wt 298.8 lb

## 2019-05-16 DIAGNOSIS — R7309 Other abnormal glucose: Secondary | ICD-10-CM

## 2019-05-16 DIAGNOSIS — E785 Hyperlipidemia, unspecified: Secondary | ICD-10-CM

## 2019-05-16 DIAGNOSIS — I10 Essential (primary) hypertension: Secondary | ICD-10-CM | POA: Diagnosis not present

## 2019-05-16 DIAGNOSIS — Z23 Encounter for immunization: Secondary | ICD-10-CM | POA: Diagnosis not present

## 2019-05-16 DIAGNOSIS — R531 Weakness: Secondary | ICD-10-CM | POA: Diagnosis not present

## 2019-05-16 DIAGNOSIS — Z136 Encounter for screening for cardiovascular disorders: Secondary | ICD-10-CM | POA: Diagnosis not present

## 2019-05-16 DIAGNOSIS — G4700 Insomnia, unspecified: Secondary | ICD-10-CM | POA: Diagnosis not present

## 2019-05-16 DIAGNOSIS — R7989 Other specified abnormal findings of blood chemistry: Secondary | ICD-10-CM | POA: Diagnosis not present

## 2019-05-16 DIAGNOSIS — D539 Nutritional anemia, unspecified: Secondary | ICD-10-CM | POA: Diagnosis not present

## 2019-05-16 DIAGNOSIS — D649 Anemia, unspecified: Secondary | ICD-10-CM | POA: Diagnosis not present

## 2019-05-16 DIAGNOSIS — Z0000 Encounter for general adult medical examination without abnormal findings: Secondary | ICD-10-CM | POA: Diagnosis not present

## 2019-05-16 DIAGNOSIS — R600 Localized edema: Secondary | ICD-10-CM | POA: Diagnosis not present

## 2019-05-16 DIAGNOSIS — R718 Other abnormality of red blood cells: Secondary | ICD-10-CM | POA: Diagnosis not present

## 2019-05-16 MED ORDER — TRAZODONE HCL 50 MG PO TABS: 25.0000 mg | ORAL_TABLET | Freq: Every evening | ORAL | 0 refills | Status: DC | PRN

## 2019-05-16 MED ORDER — SHINGRIX 50 MCG/0.5ML IM SUSR: 0.5000 mL | Freq: Once | INTRAMUSCULAR | 0 refills | Status: AC

## 2019-05-16 NOTE — Assessment & Plan Note (Signed)
Continue all medications as directed. Please start Trazodone 50mg  1/2 to 1 tablet at night as needed for sleep. Please stop OTC Benadryl. Increase daily water intake, follow DASH diet. Elevate your lower extremities as often as you can. We will contact you with lab results. Continue follow-up with Cardiology and Neurology as directed. Follow-up 4 weeks TeleMedicine, re: Insomnia, started on Trazodone. Follow-up 6 months for chronic medical conditions. Continue to social distance and wear a mask when in public.

## 2019-05-16 NOTE — Patient Instructions (Addendum)

## 2019-05-16 NOTE — Assessment & Plan Note (Signed)
BP at goal 114/66, HR 73 Currently on Atenolol 25mg  BID, Linsinopril 10mg  QD

## 2019-05-16 NOTE — Assessment & Plan Note (Addendum)
Direct LDL drawn today Currently on Atorvastatin 10mg  QD Goal LDL <70

## 2019-05-16 NOTE — Assessment & Plan Note (Signed)
Elevate lower extremities as often as possible. DASH diet. Furosemide as directed by cards.

## 2019-05-16 NOTE — Assessment & Plan Note (Signed)
Please start Trazodone 50mg  1/2 to 1 tablet at night as needed for sleep. Please stop OTC Benadryl. Follow-up 4 weeks TeleMedicine, re: Insomnia, started on Trazodone.

## 2019-05-17 LAB — TSH: TSH: 2.09 u[IU]/mL (ref 0.450–4.500)

## 2019-05-17 LAB — CBC WITH DIFFERENTIAL/PLATELET
Basophils Absolute: 0 10*3/uL (ref 0.0–0.2)
Basos: 0 %
EOS (ABSOLUTE): 0.1 10*3/uL (ref 0.0–0.4)
Eos: 3 %
Hematocrit: 34.2 % — ABNORMAL LOW (ref 37.5–51.0)
Hemoglobin: 11.7 g/dL — ABNORMAL LOW (ref 13.0–17.7)
Immature Grans (Abs): 0 10*3/uL (ref 0.0–0.1)
Immature Granulocytes: 0 %
Lymphocytes Absolute: 1 10*3/uL (ref 0.7–3.1)
Lymphs: 23 %
MCH: 34.1 pg — ABNORMAL HIGH (ref 26.6–33.0)
MCHC: 34.2 g/dL (ref 31.5–35.7)
MCV: 100 fL — ABNORMAL HIGH (ref 79–97)
Monocytes Absolute: 0.8 10*3/uL (ref 0.1–0.9)
Monocytes: 19 %
Neutrophils Absolute: 2.5 10*3/uL (ref 1.4–7.0)
Neutrophils: 55 %
Platelets: 110 10*3/uL — ABNORMAL LOW (ref 150–450)
RBC: 3.43 x10E6/uL — ABNORMAL LOW (ref 4.14–5.80)
RDW: 13.6 % (ref 11.6–15.4)
WBC: 4.5 10*3/uL (ref 3.4–10.8)

## 2019-05-17 LAB — LDL CHOLESTEROL, DIRECT: LDL Direct: 65 mg/dL (ref 0–99)

## 2019-05-17 LAB — HEMOGLOBIN A1C
Est. average glucose Bld gHb Est-mCnc: 105 mg/dL
Hgb A1c MFr Bld: 5.3 % (ref 4.8–5.6)

## 2019-05-18 ENCOUNTER — Other Ambulatory Visit: Payer: Self-pay

## 2019-05-18 DIAGNOSIS — R718 Other abnormality of red blood cells: Secondary | ICD-10-CM

## 2019-05-18 DIAGNOSIS — R531 Weakness: Secondary | ICD-10-CM

## 2019-05-18 DIAGNOSIS — D649 Anemia, unspecified: Secondary | ICD-10-CM

## 2019-05-21 NOTE — Progress Notes (Signed)
Patient's Name: [name]  MRN: [id number]  Referring Provider: Esaw Grandchild, NP  DOB: [birth year]  PCP: Esaw Grandchild, NP  DOS: 05/22/2019  Note by: Gillis Santa, MD  Service setting: Ambulatory outpatient  Specialty: Interventional Pain Management  Location: [facility] (AMB) Pain Management Facility  Visit type: Initial Patient Evaluation  Patient type: New Patient   Primary Reason(s) for Visit: Encounter for initial evaluation of one or more chronic problems (new to examiner) potentially causing chronic pain, and posing a threat to normal musculoskeletal function. (Level of risk: High) CC: Back Pain (lower, both sides)  HPI  [name] is a 68 y.o. year old, adult patient, who comes today to see [place] for the first time for an initial evaluation of his chronic pain. He has Stented coronary artery; Myositis; Hyperlipidemia; Coronary artery disease; Spinal stenosis; Weakness; HTN (hypertension); Healthcare maintenance; Encounter for Medicare annual wellness exam; Colon cancer screening; Flu vaccine need; BMI 37.0-37.9, adult; Bilateral lower extremity edema; Insomnia; S/P cervical spinal fusion; Cervical facet joint syndrome; Chronic radicular lumbar pain; Lumbar degenerative disc disease; Lumbar spondylosis; and Lumbar facet arthropathy on their problem list. Today he comes in for evaluation of his Back Pain (lower, both sides)  Pain Assessment: Location: Right, Left, Lower Back Radiating: denies Onset: More than a month ago Duration: Chronic pain Quality: Tightness Severity: 10-Worst pain ever/10 (subjective, self-reported pain score)  Note: Reported level is inconsistent with clinical observations.                         When using our objective Pain Scale, levels between 6 and 10/10 are said to belong in an emergency room, as it progressively worsens from a 6/10, described as severely limiting, requiring emergency care not usually available at an outpatient pain management facility. At a 6/10  level, communication becomes difficult and requires great effort. Assistance to reach the emergency department may be required. Facial flushing and profuse sweating along with potentially dangerous increases in heart rate and blood pressure will be evident. Effect on ADL: difficulty perfiorming daily activities Timing: Intermittent Modifying factors: sitting BP: (!) 142/73  HR: 77  Onset and Duration: Gradual and Present longer than 3 months Cause of pain: Unknown Severity: Getting worse and NAS-11 at its worse: 1/101010  Timing: During activity or exercise and After activity or exercise Aggravating Factors: Motion, Surgery made it worse and Walking Alleviating Factors: Medications Associated Problems: Weakness Quality of Pain: Agonizing Previous Examinations or Tests: Neurological evaluation Previous Treatments: Epidural steroid injections  The patient comes into the clinics today for the first time for a chronic pain management evaluation.  Patient is a pleasant 68 year old male with history of obesity (BMI of 41.6) who presents with a chief complaint of low back pain, bilateral hip pain which occasionally radiates to his anterior thighs.  He states that this has been going on for many years.  He states that he has always been heavy in terms of his body habitus.  Of note patient did have lumbar spine surgery performed in [place] in 2012.  This was likely a minimally invasive surgery as his most recent MRI from 2019 does not make any comment about a laminectomy or any fusion hardware being present.  Patient does not recall what sort of surgery was performed.  He also has a history of ACDF.  His primary pain complaint is his low back, bilateral hip and buttock pain.  He has tried physical therapy in the past which  was not effective.  He was being treated for myositis and received IVIG as well as prednisone.  He states that the present prednisone helped out with his quadricep related pain but not  his back pain.  He is now dealing with lower extremity swelling.  Patient also endorses insomnia and trouble sleeping.  He does take ibuprofen 800 mg daily as needed when he does have a pain flare.  He is not on any opioid medications.  He denies being on or tried gabapentin, Lyrica, Cymbalta.  Of note patient will be in [place] for 3 months starting in December.  He denies any bowel or bladder dysfunction.  Historic Controlled Substance Pharmacotherapy Review   Risk Assessment Profile: Aberrant behavior: None observed or detected today Risk factors for fatal opioid overdose: None identified today Fatal overdose hazard ratio (HR): Calculation deferred Non-fatal overdose hazard ratio (HR): Calculation deferred Risk of opioid abuse or dependence: 0.7-3.0% with doses ? 36 MME/day and 6.1-26% with doses ? 120 MME/day. Substance use disorder (SUD) risk level: See below Personal History of Substance Abuse (SUD-Substance use disorder):  Alcohol: Negative  Illegal Drugs: Negative  Rx Drugs: Negative  ORT Risk Level calculation: Low Risk Opioid Risk Tool - 05/22/19 1021      Family History of Substance Abuse   Alcohol  Negative    Illegal Drugs  Negative    Rx Drugs  Negative      Personal History of Substance Abuse   Alcohol  Negative    Illegal Drugs  Negative    Rx Drugs  Negative      Age   Age between 57-45 years   No      History of Preadolescent Sexual Abuse   History of Preadolescent Sexual Abuse  Negative or Male      Psychological Disease   Psychological Disease  Negative    Depression  Negative      Total Score   Opioid Risk Tool Scoring  0    Opioid Risk Interpretation  Low Risk      ORT Scoring interpretation table:  Score <3 = Low Risk for SUD  Score between 4-7 = Moderate Risk for SUD  Score >8 = High Risk for Opioid Abuse   PHQ-2 Depression Scale:  Total score: 0  PHQ-2 Scoring interpretation table: (Score and probability of major depressive disorder)  Score  0 = No depression  Score 1 = 15.4% Probability  Score 2 = 21.1% Probability  Score 3 = 38.4% Probability  Score 4 = 45.5% Probability  Score 5 = 56.4% Probability  Score 6 = 78.6% Probability   PHQ-9 Depression Scale:  Total score: 0  PHQ-9 Scoring interpretation table:  Score 0-4 = No depression  Score 5-9 = Mild depression  Score 10-14 = Moderate depression  Score 15-19 = Moderately severe depression  Score 20-27 = Severe depression (2.4 times higher risk of SUD and 2.89 times higher risk of overuse)   Pharmacologic Plan: As per protocol, I have not taken over any controlled substance management, pending the results of ordered tests and/or consults.            Initial impression: Pending review of available data and ordered tests.  Meds   Current Outpatient Medications:  .  aspirin EC 81 MG EC tablet, Take 1 tablet (81 mg total) by mouth daily., Disp: , Rfl:  .  atenolol (TENORMIN) 25 MG tablet, Take 1 tablet (25 mg total) by mouth 2 (two) times daily., Disp: 180  tablet, Rfl: 1 .  atorvastatin (LIPITOR) 10 MG tablet, Take 1 tablet (10 mg total) by mouth daily., Disp: 90 tablet, Rfl: 1 .  b complex vitamins tablet, Take 1 tablet by mouth daily., Disp: , Rfl:  .  betamethasone dipropionate (DIPROLENE) 0.05 % cream, Apply 1 application topically every other day., Disp: , Rfl:  .  cholecalciferol (VITAMIN D) 1000 units tablet, Take 1,000 Units by mouth daily., Disp: , Rfl:  .  Coenzyme Q10 (CO Q 10) 100 MG CAPS, Take 100 mg by mouth daily. , Disp: , Rfl:  .  diphenhydrAMINE (BENADRYL) 25 MG tablet, Take 50 mg by mouth at bedtime., Disp: , Rfl:  .  folic acid (FOLVITE) Q000111Q MCG tablet, Take 800 mcg by mouth daily. , Disp: , Rfl:  .  furosemide (LASIX) 40 MG tablet, TAKE 1 TABLET BY MOUTH FOR 3 DAYS THEN TAKE ONLY AS NEEDED FOR EDEMA, Disp: 90 tablet, Rfl: 3 .  glucosamine-chondroitin 500-400 MG tablet, Take 2 tablets by mouth daily. , Disp: , Rfl:  .  ibuprofen (ADVIL,MOTRIN) 800 MG  tablet, Take 800 mg by mouth daily. , Disp: , Rfl:  .  Krill Oil 500 MG CAPS, Take 500 mg by mouth daily. , Disp: , Rfl:  .  lisinopril (ZESTRIL) 10 MG tablet, Take 1 tablet by mouth once daily, Disp: 90 tablet, Rfl: 2 .  Melatonin 10 MG TABS, Take 20 mg by mouth at bedtime., Disp: , Rfl:  .  nitroGLYCERIN (NITROSTAT) 0.4 MG SL tablet, Place 1 tablet (0.4 mg total) under the tongue every 5 (five) minutes as needed for chest pain., Disp: 90 tablet, Rfl: 3 .  potassium chloride (K-DUR) 10 MEQ tablet, TAKE 1 TABLET BY MOUTH FOR 3 DAYS THEN TAKE ONLY AS NEEDED WHEN YOU TAKE A LASIX, Disp: 90 tablet, Rfl: 3 .  Saw Palmetto, Serenoa repens, (SAW PALMETTO PO), Take 300 mg by mouth daily., Disp: , Rfl:  .  traZODone (DESYREL) 50 MG tablet, Take 0.5-1 tablets (25-50 mg total) by mouth at bedtime as needed for sleep., Disp: 30 tablet, Rfl: 0 .  triamcinolone cream (KENALOG) 0.1 %, Apply 1 application topically as directed., Disp: 30 g, Rfl: 3 .  gabapentin (NEURONTIN) 300 MG capsule, Take 1 capsule (300 mg total) by mouth at bedtime for 21 days, THEN 1 capsule (300 mg total) 2 (two) times daily., Disp: 99 capsule, Rfl: 0 .  tiZANidine (ZANAFLEX) 4 MG tablet, Take 1 tablet (4 mg total) by mouth 2 (two) times daily as needed for muscle spasms., Disp: 60 tablet, Rfl: 1  Imaging Review  Cervical Imaging: Cervical MR wo contrast:  Results for orders placed during the hospital encounter of 07/09/18  MR CERVICAL SPINE WO CONTRAST   Narrative   [facility] [address] [place], [place] [address] [phone]  NEUROIMAGING REPORT   STUDY DATE: 07/09/2018 PATIENT NAME: Celio Hendriks DOB: 04-May-1951 MRN: [id number]  EXAM: MRI of the cervical spine without contrast  ORDERING CLINICIAN: Andrey Spearman MD CLINICAL HISTORY: 68 year old man with weakness COMPARISON FILMS: None  TECHNIQUE: MRI of the cervical spine was obtained utilizing 3 mm sagittal  slices from the posterior  fossa down to the T2-T3 level with T1, T2 and  inversion recovery views. In addition 4 mm axial slices from [id number] down to  T1-2 level were included with T2 and gradient echo views. CONTRAST: None IMAGING SITE:  imaging, [place], [place], [place]  FINDINGS: : Movement artifact mildly limits interpretation.  On sagittal  images, the spine is imaged from above the cervicomedullary junction to  T2.   The spinal cord is of normal caliber and signal.   There is  straightening of the cervical curvature.  There is metal artifact  consistent with C5-C6 ACDF.  Mild reduced disc height is noted at C6-C7.  The discs and interspaces were further evaluated on axial views from C2 to  T2 as follows:  C2-C3: There is mild disc bulging, left greater than right uncovertebral  spurring and left facet hypertrophy.  There is moderately severe left  foraminal narrowing and mild to moderate right foraminal narrowing.  There  is possible left C3 nerve root compression.  C3-C4: There is mild disc bulging, bilateral uncovertebral spurring and  mild facet hypertrophy.  There is moderate foraminal narrowing  bilaterally.  There does not appear to be nerve root compression.  C4-C5: There is uncovertebral spurring bilaterally causing moderate  foraminal narrowing but no nerve root compression.  C5-C6: This level is postoperative.  There is mild foraminal narrowing but  no nerve root compression.  C6-C7: There is disc bulging and uncovertebral spurring causing moderate  bilateral foraminal narrowing but no nerve root compression.  C7-T1: There is mild disc bulging.  There is only minimal foraminal  narrowing and no nerve root compression.  T1-T2: The disc and interspace appear normal.     Impression This MRI of the cervical spine without contrast shows the  following: 1.    The spinal cord appears normal. 2.    There has been prior C5-C6 ACDF. 3.    Multilevel degenerative changes as  detailed above.  There is  potential for left C3 nerve root compression due to left greater than  right degenerative changes at C2-C3.  There is moderate foraminal  narrowing at C4-C5 and C6-C7 though there does not appear to be nerve root  compression at those levels.  Interpretation of the extent of foraminal  narrowing is somewhat limited due to movement artifact.    INTERPRETING PHYSICIAN:  Richard A. Felecia Shelling, MD, PhD, FAAN Certified in  Neuroimaging by [employer]       Lumbosacral Imaging: Lumbar MR wo contrast:  Results for orders placed during the hospital encounter of 07/09/18  MR LUMBAR SPINE [place]   Narrative   [facility] [address], [place], [place] [address] [phone]  NEUROIMAGING REPORT   STUDY DATE: 07/09/2018 PATIENT NAME: Dayna Eiben DOB: 07/27/1950 MRN: [id number]  EXAM: MRI of the lumbar spine without contrast  ORDERING CLINICIAN: Andrey Spearman MD CLINICAL HISTORY: 68 year old man with muscle weakness COMPARISON FILMS: None  TECHNIQUE: MRI of the lumbar spine was obtained utilizing 4 mm sagittal  slices from 0000000 down to the lower sacrum with T1, T2 and inversion  recovery views. In addition 4 mm axial slices from [id number] down to L5-S1  level were included with T1 and T2 weighted views. CONTRAST: None IMAGING SITE: [facility], [address] [place], [place], [place]  FINDINGS: On sagittal images, the spine is imaged from T11 to the sacrum.    The conus medullaris and cauda equine appear normal.   The vertebral  bodies are normally aligned.  There are splotchy marrow changes noted in  the sacrum and lumbar vertebral bodies.  Edema is noted in the endplates  around the 624THL and L5-S1 discs.  There is moderately severe loss of disc  height at L2-L3, L3-L4 and L5-S1.  The discs and interspaces were further  evaluated on axial views from L1 to  S1 as follows:  T12-L1: There is mild  disc bulging.  Neural foramina and lateral recesses  are not narrowed and and there is no nerve root compression or spinal  stenosis.  L1-L2: There is mild spinal stenosis due to broad disc protrusion and  endplate spurring.  There is mild left foraminal narrowing and moderate  left lateral recess stenosis.  There does not appear to be any nerve root  compression.  L2-L3: There is mild spinal stenosis due to a combination of broad disc  protrusion, endplate spurring, facet hypertrophy and mild left ligamenta  flava hypertrophy.  There is mild bilateral foraminal narrowing and  lateral recess stenosis.   There is no nerve root compression.  L3-L4: There is disc protrusion, endplate spurring and moderately severe  facet hypertrophy.  There is mild to moderate bilateral foraminal  narrowing and lateral recess stenosis.  There does not appear to be nerve  root compression.  L4-L5: There is disc protrusion to the left and severe  facet hypertrophy  with joint effusions and left ligamenta flava hypertrophy.  These combine  to cause moderate left foraminal narrowing, moderate left lateral recess  stenosis, mild right foraminal narrowing and mild to moderate right  lateral recess stenosis.  There is no definite nerve root compression.  L5-S1: There is central disc protrusion, endplate spurring and severe  facet hypertrophy with joint effusions and ligamenta flava hypertrophy.   These combine to cause mild spinal stenosis, moderately severe bilateral  foraminal narrowing and moderately severe left lateral recess stenosis  with mild to moderate right lateral recess stenosis.  There is potential  for bilateral L5 and left S1 nerve root compression at this level.     Impression This MRI of the lumbar spine without contrast shows the  following: 1.    At L1-L2, there is mild spinal stenosis and moderate left lateral  recess stenosis but no definite nerve root compression. 2.    At L2-L3,  there is mild spinal stenosis but no significant foraminal  or lateral recess stenosis and no nerve root compression. 3.    At L3-L4, there is disc protrusion, endplate spurring and moderately  severe facet hypertrophy causing mild to moderate foraminal and lateral  recess stenosis but no definite nerve root compression. 4.    At L4-L5, there is disc protrusion, severe facet hypertrophy, facet  joint effusions and left ligamentum flavum hypertrophy causing moderate  left foraminal and lateral recess stenosis and moderate right lateral  recess stenosis but no definite nerve root compression. 5.    At L5-S1, there is disc protrusion and severe facet hypertrophy with  joint effusions combining to cause mild spinal stenosis and moderately  severe bilateral foraminal narrowing and moderately severe left lateral  recess stenosis.  There is potential for bilateral L5 and left S1 nerve  root compression.    INTERPRETING PHYSICIAN:  Richard A. Felecia Shelling, MD, PhD, FAAN Certified in  Neuroimaging by [employer]      Complexity Note: Imaging results reviewed. Results shared with Mr. Pesicka, using Layman's terms.                         ROS  Cardiovascular: Daily Aspirin intake, Heart surgery and Heart valve problems Pulmonary or Respiratory: Lung problems   Neurological: No reported neurological signs or symptoms such as seizures, abnormal skin sensations, urinary and/or fecal incontinence, being born with an abnormal  open spine and/or a tethered spinal cord Review of Past Neurological Studies: No results found for this or any previous visit. Psychological-Psychiatric: No reported psychological or psychiatric signs or symptoms such as difficulty sleeping, anxiety, depression, delusions or hallucinations (schizophrenial), mood swings (bipolar disorders) or suicidal ideations or attempts Gastrointestinal: No reported gastrointestinal signs or symptoms such as vomiting or evacuating  blood, reflux, heartburn, alternating episodes of diarrhea and constipation, inflamed or scarred liver, or pancreas or irrregular and/or infrequent bowel movements Genitourinary: No reported renal or genitourinary signs or symptoms such as difficulty voiding or producing urine, peeing blood, non-functioning kidney, kidney stones, difficulty emptying the bladder, difficulty controlling the flow of urine, or chronic kidney disease Hematological: No reported hematological signs or symptoms such as prolonged bleeding, low or poor functioning platelets, bruising or bleeding easily, hereditary bleeding problems, low energy levels due to low hemoglobin or being anemic Endocrine: No reported endocrine signs or symptoms such as high or low blood sugar, rapid heart rate due to high thyroid levels, obesity or weight gain due to slow thyroid or thyroid disease Rheumatologic: Joint aches and or swelling due to excess weight (Osteoarthritis) Musculoskeletal: Negative for myasthenia gravis, muscular dystrophy, multiple sclerosis or malignant hyperthermia Work History: Retired  Allergies  Mr. Dekay has No Known Allergies.  Laboratory Chemistry Profile   Renal Lab Results  Component Value Date   BUN 30 (H) 04/26/2019   CREATININE 1.04 04/26/2019   BCR 29 (H) 04/26/2019   GFR 85.18 03/25/2015   GFRAA 84 04/26/2019   GFRNONAA 72 04/26/2019                             Hepatic Lab Results  Component Value Date   AST 26 12/05/2018   ALT 32 12/05/2018   ALBUMIN 4.1 12/05/2018   ALKPHOS 49 12/05/2018                        Electrolytes Lab Results  Component Value Date   NA 139 04/26/2019   K 4.9 04/26/2019   CL 101 04/26/2019   CALCIUM 9.4 04/26/2019                        Neuropathy Lab Results  Component Value Date   VITAMINB12 668 05/16/2019   FOLATE >20.0 05/16/2019   HGBA1C 5.3 05/16/2019                        Coagulation Lab Results  Component Value Date   INR 0.9 06/21/2014    LABPROT 10.5 06/21/2014   APTT 30.7 06/21/2014   PLT 110 (L) 05/16/2019                        Cardiovascular Lab Results  Component Value Date   CKTOTAL 204 12/05/2018   CKMB CANCELED 10/03/2014   HGB 11.7 (L) 05/16/2019   HCT 34.2 (L) 05/16/2019                          Endocrine Lab Results  Component Value Date   TSH 2.090 05/16/2019                        Note: Lab results reviewed.  PFSH  Drug: Mr. Vititoe  reports no history of drug use. Alcohol:  reports current  alcohol use. Tobacco:  reports that he quit smoking about 29 years ago. His smoking use included cigarettes. He has a 17.25 pack-year smoking history. He has never used smokeless tobacco. Medical:  has a past medical history of Coronary artery disease, Dyspnea, HTN (hypertension), Hyperlipidemia, Myositis, Palatal fistula, Palpable abd. aorta, Pneumonia, RBBB, Spinal stenosis, and Weakness. Family: family history includes Aneurysm in his father; Cancer in his mother; Cystic fibrosis in his brother; Heart attack in his mother; Heart disease in his father.  Past Surgical History:  Procedure Laterality Date  . CERVICAL FUSION  09/2009  . CORONARY ANGIOPLASTY    . CORONARY STENT PLACEMENT  06/24/2014   DES to RCA  . EYE SURGERY     Lasik  . HYDROCELE EXCISION    . LEFT HEART CATHETERIZATION WITH CORONARY ANGIOGRAM N/A 06/24/2014   Procedure: LEFT HEART CATHETERIZATION WITH CORONARY ANGIOGRAM;  Surgeon: Troy Sine, MD;  Location: [facility] CATH LAB;  Service: Cardiovascular;  Laterality: N/A;  . LUMBAR SPINE SURGERY  5/13  . MUSCLE BIOPSY Left 07/11/2018   Procedure: Left Vastus lateralus muscle biopsy;  Surgeon: Judith Part, MD;  Location: [facility];  Service: Neurosurgery;  Laterality: Left;  Left Vastus lateralus muscle biopsy  . PERCUTANEOUS CORONARY STENT INTERVENTION (PCI-S)  06/24/2014   Procedure: PERCUTANEOUS CORONARY STENT INTERVENTION (PCI-S);  Surgeon: Troy Sine, MD;  Location: [facility] CATH LAB;   Service: Cardiovascular;;  Distal RCA   Active Ambulatory Problems    Diagnosis Date Noted  . Stented coronary artery   . Myositis   . Hyperlipidemia   . Coronary artery disease   . Spinal stenosis   . Weakness   . HTN (hypertension)   . Healthcare maintenance 02/15/2018  . Encounter for Medicare annual wellness exam 04/27/2018  . Colon cancer screening 04/27/2018  . Flu vaccine need 04/27/2018  . BMI 37.0-37.9, adult 07/20/2018  . Bilateral lower extremity edema 04/04/2019  . Insomnia 05/16/2019  . S/P cervical spinal fusion 05/22/2019  . Cervical facet joint syndrome 05/22/2019  . Chronic radicular lumbar pain 05/22/2019  . Lumbar degenerative disc disease 05/22/2019  . Lumbar spondylosis 05/22/2019  . Lumbar facet arthropathy 05/22/2019   Resolved Ambulatory Problems    Diagnosis Date Noted  . Unstable angina ([place]) 06/24/2014   Past Medical History:  Diagnosis Date  . Dyspnea   . Palatal fistula   . Palpable abd. aorta   . Pneumonia   . RBBB    Constitutional Exam  General appearance: Well nourished, well developed, and well hydrated. In no apparent acute distress Vitals:   05/22/19 1014  BP: (!) 142/73  Pulse: 77  Resp: 18  Temp: (!) 97.3 F (36.3 C)  TempSrc: Temporal  SpO2: 92%  Weight: 290 lb (131.5 kg)  Height: 5\' 10"  (1.778 m)   BMI Assessment: Estimated body mass index is 41.61 kg/m as calculated from the following:   Height as of this encounter: 5\' 10"  (1.778 m).   Weight as of this encounter: 290 lb (131.5 kg).  BMI interpretation table: BMI level Category Range association with higher incidence of chronic pain  <18 kg/m2 Underweight   18.5-24.9 kg/m2 Ideal body weight   25-29.9 kg/m2 Overweight Increased incidence by 20%  30-34.9 kg/m2 Obese (Class I) Increased incidence by 68%  35-39.9 kg/m2 Severe obesity (Class II) Increased incidence by 136%  >40 kg/m2 Extreme obesity (Class III) Increased incidence by 254%   Patient's current BMI  Ideal Body weight  Body mass index is 41.61  kg/m. Ideal body weight: 73 kg (160 lb 15 oz) Adjusted ideal body weight: 96.4 kg (212 lb 9 oz)   BMI Readings from Last 4 Encounters:  05/22/19 41.61 kg/m  05/16/19 44.77 kg/m  05/04/19 40.88 kg/m  04/24/19 40.84 kg/m   Wt Readings from Last 4 Encounters:  05/22/19 290 lb (131.5 kg)  05/16/19 298 lb 12.8 oz (135.5 kg)  05/04/19 293 lb 1.9 oz (133 kg)  04/24/19 292 lb 12.8 oz (132.8 kg)  Psych/Mental status: Alert, oriented x 3 (person, place, & time)       Eyes: PERLA Respiratory: No evidence of acute respiratory distress  Cervical Spine Area Exam  Skin & Axial Inspection: Well healed scar from previous spine surgery detected Alignment: Symmetrical Functional ROM: Unrestricted ROM      Stability: No instability detected Muscle Tone/Strength: Functionally intact. No obvious neuro-muscular anomalies detected. Sensory (Neurological): Musculoskeletal pain pattern Palpation: No palpable anomalies              Upper Extremity (UE) Exam    Side: Right upper extremity  Side: Left upper extremity  Skin & Extremity Inspection: Skin color, temperature, and hair growth are WNL. No peripheral edema or cyanosis. No masses, redness, swelling, asymmetry, or associated skin lesions. No contractures.  Skin & Extremity Inspection: Skin color, temperature, and hair growth are WNL. No peripheral edema or cyanosis. No masses, redness, swelling, asymmetry, or associated skin lesions. No contractures.  Functional ROM: Unrestricted ROM          Functional ROM: Unrestricted ROM          Muscle Tone/Strength: Functionally intact. No obvious neuro-muscular anomalies detected.  Muscle Tone/Strength: Functionally intact. No obvious neuro-muscular anomalies detected.  Sensory (Neurological): Unimpaired          Sensory (Neurological): Unimpaired          Palpation: No palpable anomalies              Palpation: No palpable anomalies              Provocative  Test(s):  Phalen's test: deferred Tinel's test: deferred Apley's scratch test (touch opposite shoulder):  Action 1 (Across chest): deferred Action 2 (Overhead): deferred Action 3 (LB reach): deferred   Provocative Test(s):  Phalen's test: deferred Tinel's test: deferred Apley's scratch test (touch opposite shoulder):  Action 1 (Across chest): deferred Action 2 (Overhead): deferred Action 3 (LB reach): deferred    Thoracic Spine Area Exam  Skin & Axial Inspection: No masses, redness, or swelling Alignment: Symmetrical Functional ROM: Unrestricted ROM Stability: No instability detected Muscle Tone/Strength: Functionally intact. No obvious neuro-muscular anomalies detected. Sensory (Neurological): Unimpaired Muscle strength & Tone: No palpable anomalies  Lumbar Spine Area Exam  Skin & Axial Inspection: No masses, redness, or swelling Alignment: Symmetrical Functional ROM: Pain restricted ROM       Stability: No instability detected Muscle Tone/Strength: Functionally intact. No obvious neuro-muscular anomalies detected. Sensory (Neurological): Musculoskeletal pain pattern pain with facet loading Palpation: No palpable anomalies       Provocative Tests: Hyperextension/rotation test: (+) bilaterally for facet joint pain. Lumbar quadrant test (Kemp's test): (+) bilaterally for facet joint pain. Lateral bending test: (+) due to pain. Patrick's Maneuver: deferred today                   FABER* test: deferred today                   S-I anterior distraction/compression test: deferred today  S-I lateral compression test: deferred today         S-I Thigh-thrust test: deferred today         S-I Gaenslen's test: deferred today         *(Flexion, ABduction and External Rotation)  Gait & Posture Assessment  Ambulation: Unassisted Gait: Antalgic Posture: Difficulty standing up straight, due to pain   Lower Extremity Exam    Side: Right lower extremity  Side: Left lower  extremity  Stability: No instability observed          Stability: No instability observed          Skin & Extremity Inspection: Edema  Skin & Extremity Inspection: Edema  Functional ROM: Pain restricted ROM for all joints of the lower extremity          Functional ROM: Pain restricted ROM for hip and knee joints          Muscle Tone/Strength: Functionally intact. No obvious neuro-muscular anomalies detected.  Muscle Tone/Strength: Functionally intact. No obvious neuro-muscular anomalies detected.  Sensory (Neurological): Musculoskeletal pain pattern        Sensory (Neurological): Musculoskeletal pain pattern        DTR: Patellar: 0: absent Achilles: deferred today Plantar: deferred today  DTR: Patellar: 0: absent Achilles: deferred today Plantar: deferred today  Palpation: No palpable anomalies  Palpation: No palpable anomalies   Assessment  Primary Diagnosis & Pertinent Problem List: The primary encounter diagnosis was Lumbar facet arthropathy. Diagnoses of Lumbar spondylosis, Lumbar degenerative disc disease, Chronic radicular lumbar pain, Cervical facet joint syndrome, S/P cervical spinal fusion, Weakness, and Chronic pain syndrome were also pertinent to this visit.  Visit Diagnosis (New problems to examiner): 1. Lumbar facet arthropathy   2. Lumbar spondylosis   3. Lumbar degenerative disc disease   4. Chronic radicular lumbar pain   5. Cervical facet joint syndrome   6. S/P cervical spinal fusion   7. Weakness   8. Chronic pain syndrome    Plan of Care (Initial workup plan)  Note: Mr. Doten was reminded that as per protocol, today's visit has been an evaluation only. We have not taken over the patient's controlled substance management.  General Recommendations: The pain condition that the patient suffers from is best treated with a multidisciplinary approach that involves an increase in physical activity to prevent de-conditioning and worsening of the pain cycle, as well as  psychological counseling (formal and/or informal) to address the co-morbid psychological affects of pain. Treatment will often involve judicious use of pain medications and interventional procedures to decrease the pain, allowing the patient to participate in the physical activity that will ultimately produce long-lasting pain reductions. The goal of the multidisciplinary approach is to return the patient to a higher level of overall function and to restore their ability to perform activities of daily living.  Patient's axial low back pain could be related to lumbar facet arthropathy and lumbar spondylosis as the patient's most recent lumbar MRI shows severe facet disease including facet hypertrophy and arthritic changes.  Patient has tried physical therapy in the past which was not very helpful.  He does have pain with lumbar extension and facet loading.  Krosby Vail has a history of greater than 3 months of moderate to severe pain which is resulted in functional impairment.  The patient has tried various conservative therapeutic options such as NSAIDs, Tylenol, muscle relaxants, physical therapy which was inadequately effective.  Patient's pain is predominantly axial with physical exam findings suggestive of facet  arthropathy.  Lumbar facet medial branch nerve blocks were discussed with the patient.  Risks and benefits were reviewed.  Patient would like to proceed with bilateral L3, L4, L5, S1 medial branch nerve block.  Regards to medication management, recommend patient start gabapentin to help manage his pain.  We will also prescribe tizanidine which she can take as needed to help out with his muscle spasms.  Do not recommend chronic opioid therapy at this point.  We will focus on nonopioid analgesics, interventional pain management, lifestyle modification to help him lose his weight.    Lab Orders     Compliance Drug Analysis, Ur  Procedure Orders     LUMBAR FACET(MEDIAL BRANCH NERVE BLOCK)  [facility] Pharmacotherapy (current): Medications ordered:  Meds ordered this encounter  Medications  . gabapentin (NEURONTIN) 300 MG capsule    Sig: Take 1 capsule (300 mg total) by mouth at bedtime for 21 days, THEN 1 capsule (300 mg total) 2 (two) times daily.    Dispense:  99 capsule    Refill:  0  . tiZANidine (ZANAFLEX) 4 MG tablet    Sig: Take 1 tablet (4 mg total) by mouth 2 (two) times daily as needed for muscle spasms.    Dispense:  60 tablet    Refill:  1    Do not place this medication, or any other prescription from our practice, on "Automatic Refill". Patient may have prescription filled one day early if pharmacy is closed on scheduled refill date.   Medications administered during this visit: [name] "Mikki Santee" had no medications administered during this visit.   Pharmacological management options:  Opioid Analgesics: The patient was informed that there is no guarantee that he would be a candidate for opioid analgesics. The decision will be made following CDC guidelines. This decision will be based on the results of diagnostic studies, as well as Mr. [name] risk profile.   Membrane stabilizer: Trial of gabapentin.  Can consider Lyrica or Cymbalta in future  Muscle relaxant: Trial of tizanidine.  Can consider methocarbamol, Flexeril in future  NSAID: Adequate regimen  Other analgesic(s): To be determined at a later time   Interventional management options: Mr. Seja was informed that there is no guarantee that he would be a candidate for interventional therapies. The decision will be based on the results of diagnostic studies, as well as Mr. Ueland risk profile.  Procedure(s) under consideration:  Diagnostic lumbar facet medial branch nerve blocks at L3, L4, L5, S1 Lumbar radiofrequency ablation Lumbar epidural steroid injection/caudal ESI SI joint injection   Provider-requested follow-up: Return in about 1 week (around 05/29/2019) for Procedure B/L L3, 4, 5, S1 Fct Blk , with  sedation #1.  Future Appointments  Date Time Provider [place]  05/30/2019 11:00 AM [facility] VASC 3 [facility]  06/04/2019 11:00 AM Gillis Santa, MD [facility] None  07/05/2019 11:20 AM [name], MD [id number]    Primary Care Physician: Esaw Grandchild, NP Location: [facility] Note by: Gillis Santa, MD Date: 05/22/2019; Time: 11:09 AM  Note: This dictation was prepared with Dragon dictation. Any transcriptional errors that may result from this process are unintentional.

## 2019-05-22 ENCOUNTER — Other Ambulatory Visit: Payer: Self-pay

## 2019-05-22 ENCOUNTER — Ambulatory Visit
Payer: Medicare Other | Attending: Student in an Organized Health Care Education/Training Program | Admitting: Student in an Organized Health Care Education/Training Program

## 2019-05-22 ENCOUNTER — Encounter: Payer: Self-pay | Admitting: Gastroenterology

## 2019-05-22 ENCOUNTER — Encounter: Payer: Self-pay | Admitting: Student in an Organized Health Care Education/Training Program

## 2019-05-22 ENCOUNTER — Other Ambulatory Visit: Payer: Self-pay | Admitting: Adult Health

## 2019-05-22 VITALS — BP 142/73 | HR 77 | Temp 97.3°F | Resp 18 | Ht 70.0 in | Wt 290.0 lb

## 2019-05-22 DIAGNOSIS — R531 Weakness: Secondary | ICD-10-CM | POA: Diagnosis not present

## 2019-05-22 DIAGNOSIS — Z981 Arthrodesis status: Secondary | ICD-10-CM

## 2019-05-22 DIAGNOSIS — G8929 Other chronic pain: Secondary | ICD-10-CM

## 2019-05-22 DIAGNOSIS — Z136 Encounter for screening for cardiovascular disorders: Secondary | ICD-10-CM

## 2019-05-22 DIAGNOSIS — M47812 Spondylosis without myelopathy or radiculopathy, cervical region: Secondary | ICD-10-CM | POA: Diagnosis not present

## 2019-05-22 DIAGNOSIS — G894 Chronic pain syndrome: Secondary | ICD-10-CM

## 2019-05-22 DIAGNOSIS — D649 Anemia, unspecified: Secondary | ICD-10-CM

## 2019-05-22 DIAGNOSIS — M47816 Spondylosis without myelopathy or radiculopathy, lumbar region: Secondary | ICD-10-CM | POA: Diagnosis not present

## 2019-05-22 DIAGNOSIS — M5136 Other intervertebral disc degeneration, lumbar region: Secondary | ICD-10-CM | POA: Insufficient documentation

## 2019-05-22 DIAGNOSIS — M5416 Radiculopathy, lumbar region: Secondary | ICD-10-CM | POA: Diagnosis not present

## 2019-05-22 DIAGNOSIS — R0989 Other specified symptoms and signs involving the circulatory and respiratory systems: Secondary | ICD-10-CM

## 2019-05-22 DIAGNOSIS — M51369 Other intervertebral disc degeneration, lumbar region without mention of lumbar back pain or lower extremity pain: Secondary | ICD-10-CM

## 2019-05-22 DIAGNOSIS — Z1211 Encounter for screening for malignant neoplasm of colon: Secondary | ICD-10-CM

## 2019-05-22 LAB — IRON,TIBC AND FERRITIN PANEL
Ferritin: 531 ng/mL — ABNORMAL HIGH (ref 30–400)
Iron Saturation: 19 % (ref 15–55)
Iron: 48 ug/dL (ref 38–169)
Total Iron Binding Capacity: 256 ug/dL (ref 250–450)
UIBC: 208 ug/dL (ref 111–343)

## 2019-05-22 LAB — RETICULOCYTES: Retic Ct Pct: 2 % (ref 0.6–2.6)

## 2019-05-22 LAB — TRANSFERRIN: Transferrin: 223 mg/dL (ref 177–329)

## 2019-05-22 LAB — SPECIMEN STATUS REPORT

## 2019-05-22 LAB — B12 AND FOLATE PANEL
Folate: >20 ng/mL (ref >3.0)
Vitamin B-12: 668 pg/mL (ref 232–1245)

## 2019-05-22 MED ORDER — GABAPENTIN 300 MG PO CAPS: ORAL_CAPSULE | ORAL | 0 refills | Status: DC

## 2019-05-22 MED ORDER — TIZANIDINE HCL 4 MG PO TABS: 4.0000 mg | ORAL_TABLET | Freq: Two times a day (BID) | ORAL | 1 refills | Status: DC | PRN

## 2019-05-22 NOTE — Progress Notes (Signed)
Safety precautions to be maintained throughout the outpatient stay will include: orient to surroundings, keep bed in low position, maintain call bell within reach at all times, provide assistance with transfer out of bed and ambulation.  

## 2019-05-22 NOTE — Patient Instructions (Addendum)

## 2019-05-23 DIAGNOSIS — G894 Chronic pain syndrome: Secondary | ICD-10-CM | POA: Diagnosis not present

## 2019-05-26 LAB — COMPLIANCE DRUG ANALYSIS, UR

## 2019-05-30 ENCOUNTER — Ambulatory Visit (HOSPITAL_COMMUNITY)
Admission: RE | Admit: 2019-05-30 | Discharge: 2019-05-30 | Disposition: A | Discharge status: Home / Self Care | Payer: Medicare Other | Source: Ambulatory Visit | Attending: Cardiology | Admitting: Cardiology

## 2019-05-30 ENCOUNTER — Other Ambulatory Visit: Payer: Self-pay

## 2019-05-30 ENCOUNTER — Other Ambulatory Visit: Payer: Self-pay | Admitting: Adult Health

## 2019-05-30 ENCOUNTER — Encounter: Payer: Self-pay | Admitting: Adult Health

## 2019-05-30 DIAGNOSIS — I700 Atherosclerosis of aorta: Secondary | ICD-10-CM

## 2019-05-30 DIAGNOSIS — R0989 Other specified symptoms and signs involving the circulatory and respiratory systems: Secondary | ICD-10-CM | POA: Insufficient documentation

## 2019-05-30 DIAGNOSIS — Z136 Encounter for screening for cardiovascular disorders: Secondary | ICD-10-CM | POA: Diagnosis not present

## 2019-05-30 DIAGNOSIS — Z8249 Family history of ischemic heart disease and other diseases of the circulatory system: Secondary | ICD-10-CM

## 2019-06-04 ENCOUNTER — Ambulatory Visit: Payer: Medicare Other | Admitting: Gastroenterology

## 2019-06-04 ENCOUNTER — Encounter: Payer: Self-pay | Admitting: Student in an Organized Health Care Education/Training Program

## 2019-06-04 ENCOUNTER — Ambulatory Visit
Admission: RE | Admit: 2019-06-04 | Discharge: 2019-06-04 | Disposition: A | Discharge status: Home / Self Care | Payer: Medicare Other | Source: Ambulatory Visit | Attending: Student in an Organized Health Care Education/Training Program | Admitting: Student in an Organized Health Care Education/Training Program

## 2019-06-04 ENCOUNTER — Other Ambulatory Visit: Payer: Self-pay

## 2019-06-04 ENCOUNTER — Ambulatory Visit (HOSPITAL_BASED_OUTPATIENT_CLINIC_OR_DEPARTMENT_OTHER): Payer: Medicare Other | Admitting: Student in an Organized Health Care Education/Training Program

## 2019-06-04 VITALS — BP 123/58 | HR 57 | Temp 97.7°F | Resp 16 | Ht 71.0 in | Wt 290.0 lb

## 2019-06-04 DIAGNOSIS — M47816 Spondylosis without myelopathy or radiculopathy, lumbar region: Secondary | ICD-10-CM

## 2019-06-04 MED ORDER — ROPIVACAINE HCL 2 MG/ML IJ SOLN
2.0000 mL | Freq: Once | INTRAMUSCULAR | Status: AC
  Administered 2019-06-04: 2 mL via EPIDURAL
  Filled 2019-06-04: qty 10

## 2019-06-04 MED ORDER — LIDOCAINE HCL 2 % IJ SOLN
20.0000 mL | Freq: Once | INTRAMUSCULAR | Status: AC
  Administered 2019-06-04: 400 mg
  Filled 2019-06-04: qty 40

## 2019-06-04 MED ORDER — FENTANYL CITRATE (PF) 100 MCG/2ML IJ SOLN
25.0000 ug | INTRAMUSCULAR | Status: DC | PRN
  Filled 2019-06-04: qty 2

## 2019-06-04 MED ORDER — DEXAMETHASONE SODIUM PHOSPHATE 10 MG/ML IJ SOLN
10.0000 mg | Freq: Once | INTRAMUSCULAR | Status: AC
  Administered 2019-06-04: 10 mg
  Filled 2019-06-04: qty 1

## 2019-06-04 NOTE — Progress Notes (Signed)
Safety precautions to be maintained throughout the outpatient stay will include: orient to surroundings, keep bed in low position, maintain call bell within reach at all times, provide assistance with transfer out of bed and ambulation.  

## 2019-06-04 NOTE — Progress Notes (Signed)
Patient's Name: [name]  MRN: [id number]  Referring Provider: Esaw Grandchild, NP  DOB: 1950-09-22  PCP: Esaw Grandchild, NP  DOS: 06/04/2019  Note by: Gillis Santa, MD  Service setting: Ambulatory outpatient  Specialty: Interventional Pain Management  Patient type: Established  Location: [facility] (AMB) Pain Management Facility  Visit type: Interventional Procedure   Primary Reason for Visit: Interventional Pain Management Treatment. CC: Back Pain  Procedure:          Anesthesia, Analgesia, Anxiolysis:  Type: Lumbar Facet, Medial Branch Block(s) #1  Primary Purpose: Diagnostic Region: Posterolateral Lumbosacral Spine Level:  L3, L4, L5, & S1 Medial Branch Level(s). Injecting these levels blocks the L3-4, L4-5, and L5-S1 lumbar facet joints. Laterality: Bilateral  Type: Moderate (Conscious) Sedation combined with Local Anesthesia Indication(s): Analgesia and Anxiety Route: Intravenous (IV) IV Access: Secured Sedation: Meaningful verbal contact was maintained at all times during the procedure  Local Anesthetic: Lidocaine 1-2%  Position: Prone   Indications: 1. Lumbar facet arthropathy   2. Lumbar spondylosis    Pain Score: Pre-procedure: 8 /10 Post-procedure: 0-No pain/10   Pre-op Assessment:  [name] is a 68 y.o. (year old), adult patient, seen today for interventional treatment. He  has a past surgical history that includes Cervical fusion (09/2009); Coronary angioplasty; Lumbar spine surgery (5/13); Coronary stent placement (06/24/2014); left heart catheterization with coronary angiogram (N/A, 06/24/2014); percutaneous coronary stent intervention (pci-s) (06/24/2014); Hydrocele surgery; Eye surgery; and Muscle biopsy (Left, 07/11/2018). [name] has a current medication list which includes the following prescription(s): aspirin, atenolol, atorvastatin, b complex vitamins, betamethasone dipropionate, cholecalciferol, co q 10, diphenhydramine, folic acid, furosemide, gabapentin,  glucosamine-chondroitin, ibuprofen, krill oil, lisinopril, melatonin, nitroglycerin, potassium chloride, saw palmetto (serenoa repens), triamcinolone cream, tizanidine, and trazodone, and the following Facility-Administered Medications: fentanyl. His primarily concern today is the Back Pain  Initial Vital Signs:  Pulse/HCG Rate: (!) 57ECG Heart Rate: 68 Temp: (!) 97.4 F (36.3 C) Resp: (!) 24 BP: 121/73 SpO2: 99 %  BMI: Estimated body mass index is 40.45 kg/m as calculated from the following:   Height as of this encounter: 5\' 11"  (1.803 m).   Weight as of this encounter: 290 lb (131.5 kg).  Risk Assessment: Allergies: Reviewed. He has No Known Allergies.  Allergy Precautions: None required Coagulopathies: Reviewed. None identified.  Blood-thinner therapy: None at this time Active Infection(s): Reviewed. None identified. [name] is afebrile  Site Confirmation: [name] was asked to confirm the procedure and laterality before marking the site Procedure checklist: Completed Consent: Before the procedure and under the influence of no sedative(s), amnesic(s), or anxiolytics, the patient was informed of the treatment options, risks and possible complications. To fulfill our ethical and legal obligations, as recommended by the American Medical Association's Code of Ethics, I have informed the patient of my clinical impression; the nature and purpose of the treatment or procedure; the risks, benefits, and possible complications of the intervention; the alternatives, including doing nothing; the risk(s) and benefit(s) of the alternative treatment(s) or procedure(s); and the risk(s) and benefit(s) of doing nothing. The patient was provided information about the general risks and possible complications associated with the procedure. These may include, but are not limited to: failure to achieve desired goals, infection, bleeding, organ or nerve damage, allergic reactions, paralysis, and death. In  addition, the patient was informed of those risks and complications associated to Spine-related procedures, such as failure to decrease pain; infection (i.e.: Meningitis, epidural or intraspinal abscess); bleeding (i.e.: epidural hematoma, subarachnoid hemorrhage, or  any other type of intraspinal or peri-dural bleeding); organ or nerve damage (i.e.: Any type of peripheral nerve, nerve root, or spinal cord injury) with subsequent damage to sensory, motor, and/or autonomic systems, resulting in permanent pain, numbness, and/or weakness of one or several areas of the body; allergic reactions; (i.e.: anaphylactic reaction); and/or death. Furthermore, the patient was informed of those risks and complications associated with the medications. These include, but are not limited to: allergic reactions (i.e.: anaphylactic or anaphylactoid reaction(s)); adrenal axis suppression; blood sugar elevation that in diabetics may result in ketoacidosis or comma; water retention that in patients with history of congestive heart failure may result in shortness of breath, pulmonary edema, and decompensation with resultant heart failure; weight gain; swelling or edema; medication-induced neural toxicity; particulate matter embolism and blood vessel occlusion with resultant organ, and/or nervous system infarction; and/or aseptic necrosis of one or more joints. Finally, the patient was informed that Medicine is not an exact science; therefore, there is also the possibility of unforeseen or unpredictable risks and/or possible complications that may result in a catastrophic outcome. The patient indicated having understood very clearly. We have given the patient no guarantees and we have made no promises. Enough time was given to the patient to ask questions, all of which were answered to the patient's satisfaction. [name] has indicated that he wanted to continue with the procedure. Attestation: I, the ordering provider, attest that I  have discussed with the patient the benefits, risks, side-effects, alternatives, likelihood of achieving goals, and potential problems during recovery for the procedure that I have provided informed consent. Date   Time: 06/04/2019 10:42 AM  Pre-Procedure Preparation:  Monitoring: As per clinic protocol. Respiration, ETCO2, SpO2, BP, heart rate and rhythm monitor placed and checked for adequate function Safety Precautions: Patient was assessed for positional comfort and pressure points before starting the procedure. Time-out: I initiated and conducted the "Time-out" before starting the procedure, as per protocol. The patient was asked to participate by confirming the accuracy of the "Time Out" information. Verification of the correct person, site, and procedure were performed and confirmed by me, the nursing staff, and the patient. "Time-out" conducted as per Joint Commission's Universal Protocol (UP.01.01.01). Time: 1156  Description of Procedure:          Laterality: Bilateral. The procedure was performed in identical fashion on both sides. Levels:  L3, L4, L5, & S1 Medial Branch Level(s) Area Prepped: Posterior Lumbosacral Region Prepping solution: DuraPrep (Iodine Povacrylex [0.7% available iodine] and Isopropyl Alcohol, 74% w/w) Safety Precautions: Aspiration looking for blood return was conducted prior to all injections. At no point did we inject any substances, as a needle was being advanced. Before injecting, the patient was told to immediately notify me if he was experiencing any new onset of "ringing in the ears, or metallic taste in the mouth". No attempts were made at seeking any paresthesias. Safe injection practices and needle disposal techniques used. Medications properly checked for expiration dates. SDV (single dose vial) medications used. After the completion of the procedure, all disposable equipment used was discarded in the proper designated medical waste containers. Local  Anesthesia: Protocol guidelines were followed. The patient was positioned over the fluoroscopy table. The area was prepped in the usual manner. The time-out was completed. The target area was identified using fluoroscopy. A 12-in long, straight, sterile hemostat was used with fluoroscopic guidance to locate the targets for each level blocked. Once located, the skin was marked with an approved surgical skin marker.  Once all sites were marked, the skin (epidermis, dermis, and hypodermis), as well as deeper tissues (fat, connective tissue and muscle) were infiltrated with a small amount of a short-acting local anesthetic, loaded on a 10cc syringe with a 25G, 1.5-in  Needle. An appropriate amount of time was allowed for local anesthetics to take effect before proceeding to the next step. Local Anesthetic: Lidocaine 2.0% The unused portion of the local anesthetic was discarded in the proper designated containers. Technical explanation of process:   L3 Medial Branch Nerve Block (MBB): The target area for the L3 medial branch is at the junction of the postero-lateral aspect of the superior articular process and the superior, posterior, and medial edge of the transverse process of L4. Under fluoroscopic guidance, a Quincke needle was inserted until contact was made with os over the superior postero-lateral aspect of the pedicular shadow (target area). After negative aspiration for blood, 33mL of the nerve block solution was injected without difficulty or complication. The needle was removed intact. L4 Medial Branch Nerve Block (MBB): The target area for the L4 medial branch is at the junction of the postero-lateral aspect of the superior articular process and the superior, posterior, and medial edge of the transverse process of L5. Under fluoroscopic guidance, a Quincke needle was inserted until contact was made with os over the superior postero-lateral aspect of the pedicular shadow (target area). After negative  aspiration for blood, 1 mL of the nerve block solution was injected without difficulty or complication. The needle was removed intact. L5 Medial Branch Nerve Block (MBB): The target area for the L5 medial branch is at the junction of the postero-lateral aspect of the superior articular process and the superior, posterior, and medial edge of the sacral ala. Under fluoroscopic guidance, a Quincke needle was inserted until contact was made with os over the superior postero-lateral aspect of the pedicular shadow (target area). After negative aspiration for blood, 1 mL of the nerve block solution was injected without difficulty or complication. The needle was removed intact. S1 Medial Branch Nerve Block (MBB): The target area for the S1 medial branch is at the posterior and inferior 6 o'clock position of the L5-S1 facet joint. Under fluoroscopic guidance, the Quincke needle inserted for the L5 MBB was redirected until contact was made with os over the inferior and postero aspect of the sacrum, at the 6 o' clock position under the L5-S1 facet joint (Target area). After negative aspiration for blood, 10mL of the nerve block solution was injected without difficulty or complication. The needle was removed intact.  Nerve block solution: 10 cc solution made of 8 cc of 0.2% ropivacaine, 2 cc of Decadron 10 mg/cc.  1 to 1.5 cc injected at each level above bilaterally.  The unused portion of the solution was discarded in the proper designated containers. Procedural Needles: 22-gauge, 5-inch, Quincke needles used for all levels.  Once the entire procedure was completed, the treated area was cleaned, making sure to leave some of the prepping solution back to take advantage of its long term bactericidal properties.   Illustration of the posterior view of the lumbar spine and the posterior neural structures. Laminae of L2 through S1 are labeled. DPRL5, dorsal primary ramus of L5; DPRS1, dorsal primary ramus of S1; DPR3,  dorsal primary ramus of L3; FJ, facet (zygapophyseal) joint L3-L4; I, inferior articular process of L4; LB1, lateral branch of dorsal primary ramus of L1; IAB, inferior articular branches from L3 medial branch (supplies L4-L5 facet joint); IBP,  intermediate branch plexus; MB3, medial branch of dorsal primary ramus of L3; NR3, third lumbar nerve root; S, superior articular process of L5; SAB, superior articular branches from L4 (supplies L4-5 facet joint also); TP3, transverse process of L3.  Vitals:   06/04/19 1220 06/04/19 1230 06/04/19 1240 06/04/19 1250  BP: 133/73 (!) 128/58 132/60 (!) 123/58  Pulse:      Resp: (!) 22 16 16 16   Temp:  (!) 97.5 F (36.4 C)  97.7 F (36.5 C)  SpO2: 97% 97% 97% 100%  Weight:      Height:         Start Time: 1156 hrs. End Time: 1225 hrs.  Imaging Guidance (Spinal):          Type of Imaging Technique: Fluoroscopy Guidance (Spinal) Indication(s): Assistance in needle guidance and placement for procedures requiring needle placement in or near specific anatomical locations not easily accessible without such assistance. Exposure Time: Please see nurses notes. Contrast: None used. Fluoroscopic Guidance: I was personally present during the use of fluoroscopy. "Tunnel Vision Technique" used to obtain the best possible view of the target area. Parallax error corrected before commencing the procedure. "Direction-depth-direction" technique used to introduce the needle under continuous pulsed fluoroscopy. Once target was reached, antero-posterior, oblique, and lateral fluoroscopic projection used confirm needle placement in all planes. Images permanently stored in EMR. Interpretation: No contrast injected. I personally interpreted the imaging intraoperatively. Adequate needle placement confirmed in multiple planes. Permanent images saved into the patient's record.  Antibiotic Prophylaxis:   Anti-infectives (From admission, onward)   None     Indication(s): None  identified  Post-operative Assessment:  Post-procedure Vital Signs:  Pulse/HCG Rate: (!) 57(!) 59 Temp: 97.7 F (36.5 C) Resp: 16 BP: (!) 123/58 SpO2: 100 %  EBL: None  Complications: No immediate post-treatment complications observed by team, or reported by patient.  Note: The patient tolerated the entire procedure well. A repeat set of vitals were taken after the procedure and the patient was kept under observation following institutional policy, for this type of procedure. Post-procedural neurological assessment was performed, showing return to baseline, prior to discharge. The patient was provided with post-procedure discharge instructions, including a section on how to identify potential problems. Should any problems arise concerning this procedure, the patient was given instructions to immediately contact us, at any time, without hesitation. In any case, we plan to contact the patient by telephone for a follow-up status report regarding this interventional procedure.  Comments:  No additional relevant information.  Plan of Care  Orders:  Orders Placed This Encounter  Procedures   Fluoro (C-Arm) (<60 min) (No Report)    Intraoperative interpretation by procedural physician at [place].    Standing Status:   Standing    Number of Occurrences:   1    Order Specific Question:   Reason for exam:    Answer:   Assistance in needle guidance and placement for procedures requiring needle placement in or near specific anatomical locations not easily accessible without such assistance.   Medications ordered for procedure: Meds ordered this encounter  Medications   lidocaine (XYLOCAINE) 2 % (with pres) injection 400 mg   fentaNYL (SUBLIMAZE) injection 25-50 mcg    Make sure Narcan is available in the pyxis when using this medication. In the event of respiratory depression (RR< 8/min): Titrate NARCAN (naloxone) in increments of 0.1 to 0.2 mg IV at 2-3 minute intervals,  until desired degree of reversal.   ropivacaine (PF) 2 mg/mL (  0.2%) (NAROPIN) injection 2 mL   ropivacaine (PF) 2 mg/mL (0.2%) (NAROPIN) injection 2 mL   dexamethasone (DECADRON) injection 10 mg   dexamethasone (DECADRON) injection 10 mg   Medications administered: We administered lidocaine, ropivacaine (PF) 2 mg/mL (0.2%), ropivacaine (PF) 2 mg/mL (0.2%), dexamethasone, and dexamethasone.  See the medical record for exact dosing, route, and time of administration.  Follow-up plan:   Return in about 4 weeks (around 07/02/2019) for Post Procedure Evaluation, virtual.      Bilateral L3, L4, L5, S1 facet medial branch nerve block #1 06/04/2019   Recent Visits Date Type Provider Dept  05/22/19 Office Visit Gillis Santa, MD [facility]  Showing recent visits within past 90 days and meeting all other requirements   Today's Visits Date Type Provider Dept  06/04/19 Procedure visit Gillis Santa, MD [facility]  Showing today's visits and meeting all other requirements   Future Appointments Date Type Provider Dept  07/04/19 Appointment Gillis Santa, MD [facility]  Showing future appointments within next 90 days and meeting all other requirements   Disposition: Discharge home  Discharge Date & Time: 06/04/2019; 1300 hrs.   Primary Care Physician: Esaw Grandchild, NP Location: [facility] Note by: Gillis Santa, MD Date: 06/04/2019; Time: 1:29 PM  Disclaimer:  Medicine is not an exact science. The only guarantee in medicine is that nothing is guaranteed. It is important to note that the decision to proceed with this intervention was based on the information collected from the patient. The Data and conclusions were drawn from the patient's questionnaire, the interview, and the physical examination. Because the information was provided in large part by the patient, it cannot be guaranteed that it has not been purposely or  unconsciously manipulated. Every effort has been made to obtain as much relevant data as possible for this evaluation. It is important to note that the conclusions that lead to this procedure are derived in large part from the available data. Always take into account that the treatment will also be dependent on availability of resources and existing treatment guidelines, considered by other Pain Management Practitioners as being common knowledge and practice, at the time of the intervention. For Medico-Legal purposes, it is also important to point out that variation in procedural techniques and pharmacological choices are the acceptable norm. The indications, contraindications, technique, and results of the above procedure should only be interpreted and judged by a Board-Certified Interventional Pain Specialist with extensive familiarity and expertise in the same exact procedure and technique.

## 2019-06-04 NOTE — Patient Instructions (Signed)
Pain Management Discharge Instructions  General Discharge Instructions :  If you need to reach your doctor call: Monday-Friday 8:00 am - 4:00 pm at [phone] or toll free [phone].  After clinic hours [phone] to have operator reach doctor.  Bring all of your medication bottles to all your appointments in the pain clinic.  To cancel or reschedule your appointment with Pain Management please remember to call 24 hours in advance to avoid a fee.  Refer to the educational materials which you have been given on: General Risks, I had my Procedure. Discharge Instructions, Post Sedation.  Post Procedure Instructions:  The drugs you were given will stay in your system until tomorrow, so for the next 24 hours you should not drive, make any legal decisions or drink any alcoholic beverages.  You may eat anything you prefer, but it is better to start with liquids then soups and crackers, and gradually work up to solid foods.  Please notify your doctor immediately if you have any unusual bleeding, trouble breathing or pain that is not related to your normal pain.  Depending on the type of procedure that was done, some parts of your body may feel week and/or numb.  This usually clears up by tonight or the next day.  Walk with the use of an assistive device or accompanied by an adult for the 24 hours.  You may use ice on the affected area for the first 24 hours.  Put ice in a Ziploc bag and cover with a towel and place against area 15 minutes on 15 minutes off.  You may switch to heat after 24 hours.Facet Blocks Patient Information  Description: The facets are joints in the spine between the vertebrae.  Like any joints in the body, facets can become irritated and painful.  Arthritis can also effect the facets.  By injecting steroids and local anesthetic in and around these joints, we can temporarily block the nerve supply to them.  Steroids act directly on irritated nerves and tissues to  reduce selling and inflammation which often leads to decreased pain.  Facet blocks may be done anywhere along the spine from the neck to the low back depending upon the location of your pain.   After numbing the skin with local anesthetic (like Novocaine), a small needle is passed onto the facet joints under x-ray guidance.  You may experience a sensation of pressure while this is being done.  The entire block usually lasts about 15-25 minutes.   Conditions which may be treated by facet blocks:   Low back/buttock pain  Neck/shoulder pain  Certain types of headaches  Preparation for the injection:  1. Do not eat any solid food or dairy products within 8 hours of your appointment. 2. You may drink clear liquid up to 3 hours before appointment.  Clear liquids include water, black coffee, juice or soda.  No milk or cream please. 3. You may take your regular medication, including pain medications, with a sip of water before your appointment.  Diabetics should hold regular insulin (if taken separately) and take 1/2 normal NPH dose the morning of the procedure.  Carry some sugar containing items with you to your appointment. 4. A driver must accompany you and be prepared to drive you home after your procedure. 5. Bring all your current medications with you. 6. An IV may be inserted and sedation may be given at the discretion of the physician. 7. A blood pressure cuff, EKG and other monitors will often be   applied during the procedure.  Some patients may need to have extra oxygen administered for a short period. 8. You will be asked to provide medical information, including your allergies and medications, prior to the procedure.  We must know immediately if you are taking blood thinners (like Coumadin/Warfarin) or if you are allergic to IV iodine contrast (dye).  We must know if you could possible be pregnant.  Possible side-effects:   Bleeding from needle site  Infection (rare, may require  surgery)  Nerve injury (rare)  Numbness & tingling (temporary)  Difficulty urinating (rare, temporary)  Spinal headache (a headache worse with upright posture)  Light-headedness (temporary)  Pain at injection site (serveral days)  Decreased blood pressure (rare, temporary)  Weakness in arm/leg (temporary)  Pressure sensation in back/neck (temporary)   Call if you experience:   Fever/chills associated with headache or increased back/neck pain  Headache worsened by an upright position  New onset, weakness or numbness of an extremity below the injection site  Hives or difficulty breathing (go to the emergency room)  Inflammation or drainage at the injection site(s)  Severe back/neck pain greater than usual  New symptoms which are concerning to you  Please note:  Although the local anesthetic injected can often make your back or neck feel good for several hours after the injection, the pain will likely return. It takes 3-7 days for steroids to work.  You may not notice any pain relief for at least one week.  If effective, we will often do a series of 2-3 injections spaced 3-6 weeks apart to maximally decrease your pain.  After the initial series, you may be a candidate for a more permanent nerve block of the facets.  If you have any questions, please call [phone] [facility] 

## 2019-06-05 ENCOUNTER — Telehealth: Payer: Self-pay

## 2019-06-05 NOTE — Telephone Encounter (Signed)
Post procedure phone call. Patient states he is doing well.  

## 2019-06-12 ENCOUNTER — Ambulatory Visit (INDEPENDENT_AMBULATORY_CARE_PROVIDER_SITE_OTHER): Payer: Medicare Other | Admitting: Gastroenterology

## 2019-06-12 ENCOUNTER — Other Ambulatory Visit: Payer: Self-pay

## 2019-06-12 ENCOUNTER — Encounter: Payer: Self-pay | Admitting: Gastroenterology

## 2019-06-12 VITALS — BP 80/50 | HR 65 | Temp 97.7°F | Ht 70.0 in | Wt 298.4 lb

## 2019-06-12 DIAGNOSIS — I251 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: Secondary | ICD-10-CM | POA: Diagnosis not present

## 2019-06-12 DIAGNOSIS — D539 Nutritional anemia, unspecified: Secondary | ICD-10-CM | POA: Insufficient documentation

## 2019-06-12 DIAGNOSIS — Z1159 Encounter for screening for other viral diseases: Secondary | ICD-10-CM | POA: Diagnosis not present

## 2019-06-12 NOTE — Progress Notes (Signed)
06/12/2019 [name] Maryland [id number] 01/20/1951   HISTORY OF PRESENT ILLNESS: This is a pleasant 68 year old male who is new to our office.  He has a history of coronary artery disease with stents placed in 1998 and then in 2015.  Now only maintained on 81 mg aspirin.  Also has hypertension, hyperlipidemia, myositis for which he is followed by rheumatology and neurology.  He is here today at the request of his PCP, [name] Marble, NP, for evaluation regarding anemia.  In December 2019 his hemoglobin was 13.7 g.  In May 2020 it was down to 12.2 g and now and October 2020 was 11.7 g.  MCV has been high.  Vitamin B12, folate, and iron studies are normal.  He denies any sign of GI bleeding.  He says there are no been no black or bloody stools.  He moves his bowels regularly on a daily basis.  No complaints of abdominal pain.  He tells me had a colonoscopy in [place] in 2012 at which time he believes he had some polyps removed, but we do not have those records.  No complaints of heartburn or acid reflux.   Past Medical History:  Diagnosis Date  . Coronary artery disease    a. s/p BMS to LAD (1998) b. s/p DES to RCA (06/24/14)  . Dyspnea   . HTN (hypertension)   . Hyperlipidemia   . Myositis    chronic CK elevation ( 415 CK, ALL MM CK subtype, seen by Rheumatology and considered normal Variant)  . Palatal fistula   . Palpable abd. aorta   . Pneumonia   . RBBB   . Spinal stenosis    Dr Vaughan Sine  surgery  . Weakness    Past Surgical History:  Procedure Laterality Date  . CERVICAL FUSION  09/2009  . CORONARY ANGIOPLASTY    . CORONARY STENT PLACEMENT  06/24/2014   DES to RCA  . EYE SURGERY     Lasik  . HYDROCELE EXCISION    . LEFT HEART CATHETERIZATION WITH CORONARY ANGIOGRAM N/A 06/24/2014   Procedure: LEFT HEART CATHETERIZATION WITH CORONARY ANGIOGRAM;  Surgeon: Troy Sine, MD;  Location: [facility] CATH LAB;  Service: Cardiovascular;  Laterality: N/A;  . LUMBAR SPINE SURGERY  5/13  .  MUSCLE BIOPSY Left 07/11/2018   Procedure: Left Vastus lateralus muscle biopsy;  Surgeon: Judith Part, MD;  Location: [facility];  Service: Neurosurgery;  Laterality: Left;  Left Vastus lateralus muscle biopsy  . PERCUTANEOUS CORONARY STENT INTERVENTION (PCI-S)  06/24/2014   Procedure: PERCUTANEOUS CORONARY STENT INTERVENTION (PCI-S);  Surgeon: Troy Sine, MD;  Location: [facility] CATH LAB;  Service: Cardiovascular;;  Distal RCA    reports that he quit smoking about 29 years ago. His smoking use included cigarettes. He has a 17.25 pack-year smoking history. He has never used smokeless tobacco. He reports current alcohol use. He reports that he does not use drugs. family history includes Aneurysm in his father; Cancer in his mother; Cystic fibrosis in his brother; Heart attack in his mother; Heart disease in his father. No Known Allergies    Outpatient Encounter Medications as of 06/12/2019  Medication Sig  . aspirin EC 81 MG EC tablet Take 1 tablet (81 mg total) by mouth daily.  Marland Kitchen atenolol (TENORMIN) 25 MG tablet Take 1 tablet (25 mg total) by mouth 2 (two) times daily.  Marland Kitchen atorvastatin (LIPITOR) 10 MG tablet Take 1 tablet (10 mg total) by mouth daily.  Marland Kitchen b complex vitamins  tablet Take 1 tablet by mouth daily.  . betamethasone dipropionate (DIPROLENE) 0.05 % cream Apply 1 application topically every other day.  . cholecalciferol (VITAMIN D) 1000 units tablet Take 1,000 Units by mouth daily.  . Coenzyme Q10 (CO Q 10) 100 MG CAPS Take 100 mg by mouth daily.   . diphenhydrAMINE (BENADRYL) 25 MG tablet Take 50 mg by mouth at bedtime.  . folic acid (FOLVITE) Q000111Q MCG tablet Take 800 mcg by mouth daily.   . furosemide (LASIX) 40 MG tablet TAKE 1 TABLET BY MOUTH FOR 3 DAYS THEN TAKE ONLY AS NEEDED FOR EDEMA  . gabapentin (NEURONTIN) 300 MG capsule Take 1 capsule (300 mg total) by mouth at bedtime for 21 days, THEN 1 capsule (300 mg total) 2 (two) times daily.  Marland Kitchen glucosamine-chondroitin 500-400 MG  tablet Take 2 tablets by mouth daily.   Marland Kitchen ibuprofen (ADVIL,MOTRIN) 800 MG tablet Take 800 mg by mouth daily.   Javier Docker Oil 500 MG CAPS Take 500 mg by mouth daily.   Marland Kitchen lisinopril (ZESTRIL) 10 MG tablet Take 1 tablet by mouth once daily  . Melatonin 10 MG TABS Take 20 mg by mouth at bedtime.  . nitroGLYCERIN (NITROSTAT) 0.4 MG SL tablet Place 1 tablet (0.4 mg total) under the tongue every 5 (five) minutes as needed for chest pain.  . potassium chloride (K-DUR) 10 MEQ tablet TAKE 1 TABLET BY MOUTH FOR 3 DAYS THEN TAKE ONLY AS NEEDED WHEN YOU TAKE A LASIX  . Saw Palmetto, Serenoa repens, (SAW PALMETTO PO) Take 300 mg by mouth daily.  Marland Kitchen tiZANidine (ZANAFLEX) 4 MG tablet Take 1 tablet (4 mg total) by mouth 2 (two) times daily as needed for muscle spasms.  . traZODone (DESYREL) 50 MG tablet Take 0.5-1 tablets (25-50 mg total) by mouth at bedtime as needed for sleep.  Marland Kitchen triamcinolone cream (KENALOG) 0.1 % Apply 1 application topically as directed.   No facility-administered encounter medications on file as of 06/12/2019.      REVIEW OF SYSTEMS  : All other systems reviewed and negative except where noted in the History of Present Illness.   PHYSICAL EXAM: Wt 298 lb 6 oz (135.3 kg)   BMI 41.61 kg/m  General: Well developed white male in no acute distress Head: Normocephalic and atraumatic Eyes:  Sclerae anicteric, conjunctiva pink. Ears: Normal auditory acuity Lungs: Clear throughout to auscultation; no increased WOB. Heart: Regular rate and rhythm; no M/R/G. Abdomen: Soft, non-distended.  BS present.  Non-tender. Rectal:  Will be done at the time of colonoscopy. Musculoskeletal: Symmetrical with no gross deformities  Skin: No lesions on visible extremities Extremities: No edema  Neurological: Alert oriented x 4, grossly non-focal Psychological:  Alert and cooperative. Normal mood and affect  ASSESSMENT AND PLAN: *Anemia, macrocytic:  No iron deficiency.  B12 and folate levels are normal  as well.  He denies any sign of GI bleeding.  Hemoglobin down about 2 g over the past year.  He reports colonoscopy in [place] in 2012 and thinks he had polyps removed, however, we do not have those records.  We will plan for both EGD and colonoscopy from a GI standpoint, but if those are normal then likely will not need any further GI evaluation for now.  Will schedule with Dr. Carlean Purl.  **The risks, benefits, and alternatives to EGD and colonoscopy were discussed with the patient and he consents to proceed.   **Of note, his blood pressure was low today.  He said he fell somewhat  fuzzy headed earlier this morning, but was feeling better in our office at the time of his visit.  I asked him to check his blood pressure at home today and tomorrow if it remains low then he needs to address this with his PCP or cardiologist.  CC:  Esaw Grandchild, NP

## 2019-06-12 NOTE — Patient Instructions (Signed)
If you are age 70 or older, your body mass index should be between 23-30. Your Body mass index is 42.81 kg/m. If this is out of the aforementioned range listed, please consider follow up with your Primary Care Provider.  If you are age 73 or younger, your body mass index should be between 19-25. Your Body mass index is 42.81 kg/m. If this is out of the aformentioned range listed, please consider follow up with your Primary Care Provider.   You have been scheduled for an endoscopy and colonoscopy. Please follow the written instructions given to you at your visit today. Please pick up your prep supplies at the pharmacy within the next 1-3 days. If you use inhalers (even only as needed), please bring them with you on the day of your procedure.    Due to recent COVID-19 restrictions implemented by [employer] and state authorities and in an effort to keep both patients and staff as safe as possible, [place] requires COVID-19 testing prior to any scheduled endoscopic procedure. The testing center is located at [address] [place], [place] [address] in the [facility] [employer]  suite.  Your appointment has been scheduled for 06/25/2019 at 9:40.   Please bring your insurance cards to this appointment. You will require your COVID screen 2 business days prior to your endoscopic procedure.  You are not required to quarantine after your screening.  You will only receive a phone call with the results if it is POSITIVE.  If you do not receive a call the day before your procedure you should begin your prep, if ordered, and you should report to the endo center for your procedure at your designated appointment arrival time ( one hour prior to the procedure time). There is no cost to you for the screening on the day of the swab.  [facility] will file with your insurance company for the testing.    You may receive an automated phone call prior to  your procedure or have a message in your MyChart that you have an appointment for a BP/15 at the [facility], please disregard this message.  Your testing will be at the [place] [place] location.   If you are leaving [facility] travel [place] on [address], turn left onto [address], turn night onto [address] at the 1st stop light turn right, pass the [employer] on your right and proceed to [address] (white building).   Thank you for choosing me and [facility]

## 2019-06-13 ENCOUNTER — Other Ambulatory Visit: Payer: Self-pay | Admitting: Internal Medicine

## 2019-06-24 ENCOUNTER — Other Ambulatory Visit: Payer: Self-pay | Admitting: Internal Medicine

## 2019-06-24 DIAGNOSIS — G729 Myopathy, unspecified: Secondary | ICD-10-CM

## 2019-06-24 DIAGNOSIS — E782 Mixed hyperlipidemia: Secondary | ICD-10-CM

## 2019-06-24 DIAGNOSIS — I10 Essential (primary) hypertension: Secondary | ICD-10-CM

## 2019-06-25 ENCOUNTER — Ambulatory Visit (INDEPENDENT_AMBULATORY_CARE_PROVIDER_SITE_OTHER): Payer: Medicare Other

## 2019-06-25 ENCOUNTER — Other Ambulatory Visit: Payer: Self-pay | Admitting: Internal Medicine

## 2019-06-25 DIAGNOSIS — Z1159 Encounter for screening for other viral diseases: Secondary | ICD-10-CM

## 2019-06-26 DIAGNOSIS — D126 Benign neoplasm of colon, unspecified: Secondary | ICD-10-CM

## 2019-06-26 HISTORY — DX: Benign neoplasm of colon, unspecified: D12.6

## 2019-06-26 LAB — SARS CORONAVIRUS 2 (TAT 6-24 HRS): SARS Coronavirus 2: NEGATIVE

## 2019-06-27 ENCOUNTER — Other Ambulatory Visit: Payer: Self-pay

## 2019-06-27 ENCOUNTER — Encounter: Payer: Self-pay | Admitting: Internal Medicine

## 2019-06-27 ENCOUNTER — Ambulatory Visit (AMBULATORY_SURGERY_CENTER): Payer: Medicare Other | Admitting: Internal Medicine

## 2019-06-27 VITALS — BP 123/47 | HR 28 | Temp 98.4°F | Resp 20 | Ht 70.0 in | Wt 298.0 lb

## 2019-06-27 DIAGNOSIS — K2970 Gastritis, unspecified, without bleeding: Secondary | ICD-10-CM

## 2019-06-27 DIAGNOSIS — D125 Benign neoplasm of sigmoid colon: Secondary | ICD-10-CM | POA: Diagnosis not present

## 2019-06-27 DIAGNOSIS — D539 Nutritional anemia, unspecified: Secondary | ICD-10-CM

## 2019-06-27 DIAGNOSIS — Z86010 Personal history of colonic polyps: Secondary | ICD-10-CM | POA: Diagnosis not present

## 2019-06-27 DIAGNOSIS — K2950 Unspecified chronic gastritis without bleeding: Secondary | ICD-10-CM

## 2019-06-27 DIAGNOSIS — D127 Benign neoplasm of rectosigmoid junction: Secondary | ICD-10-CM

## 2019-06-27 DIAGNOSIS — D128 Benign neoplasm of rectum: Secondary | ICD-10-CM | POA: Diagnosis not present

## 2019-06-27 DIAGNOSIS — D129 Benign neoplasm of anus and anal canal: Secondary | ICD-10-CM

## 2019-06-27 MED ORDER — SODIUM CHLORIDE 0.9 % IV SOLN: 500.0000 mL | Freq: Once | INTRAVENOUS | Status: DC

## 2019-06-27 NOTE — Patient Instructions (Addendum)
Handouts given:  Polyps, gastritis diverticulosis Resume previous diet Continue current meications    Nothing bad seen.  Some stomach inflammation - biopsies taken.  2 tiny colon polyps.  No signs of cancer.  You may need to see a blood specialist - a hematologist - to evaluate things further.  I will not be recommending any routine repeat procedures.  I appreciate the opportunity to care for you. Gatha Mayer, MD, FACG YOU HAD AN ENDOSCOPIC PROCEDURE TODAY AT [facility]:   Refer to the procedure report that was given to you for any specific questions about what was found during the examination.  If the procedure report does not answer your questions, please call your gastroenterologist to clarify.  If you requested that your care partner not be given the details of your procedure findings, then the procedure report has been included in a sealed envelope for you to review at your convenience later.  YOU SHOULD EXPECT: Some feelings of bloating in the abdomen. Passage of more gas than usual.  Walking can help get rid of the air that was put into your GI tract during the procedure and reduce the bloating. If you had a lower endoscopy (such as a colonoscopy or flexible sigmoidoscopy) you may notice spotting of blood in your stool or on the toilet paper. If you underwent a bowel prep for your procedure, you may not have a normal bowel movement for a few days.  Please Note:  You might notice some irritation and congestion in your nose or some drainage.  This is from the oxygen used during your procedure.  There is no need for concern and it should clear up in a day or so.  SYMPTOMS TO REPORT IMMEDIATELY:   Following lower endoscopy (colonoscopy or flexible sigmoidoscopy):  Excessive amounts of blood in the stool  Significant tenderness or worsening of abdominal pains  Swelling of the abdomen that is new, acute  Fever of 100F or higher   Following upper endoscopy  (EGD)  Vomiting of blood or coffee ground material  New chest pain or pain under the shoulder blades  Painful or persistently difficult swallowing  New shortness of breath  Fever of 100F or higher  Black, tarry-looking stools  For urgent or emergent issues, a gastroenterologist can be reached at any hour by calling [phone].   DIET:  We do recommend a small meal at first, but then you may proceed to your regular diet.  Drink plenty of fluids but you should avoid alcoholic beverages for 24 hours.  ACTIVITY:  You should plan to take it easy for the rest of today and you should NOT DRIVE or use heavy machinery until tomorrow (because of the sedation medicines used during the test).    FOLLOW UP: Our staff will call the number listed on your records 48-72 hours following your procedure to check on you and address any questions or concerns that you may have regarding the information given to you following your procedure. If we do not reach you, we will leave a message.  We will attempt to reach you two times.  During this call, we will ask if you have developed any symptoms of COVID 19. If you develop any symptoms (ie: fever, flu-like symptoms, shortness of breath, cough etc.) before then, please call [phone].  If you test positive for Covid 19 in the 2 weeks post procedure, please call and report this information to [place].    If any biopsies were  taken you will be contacted by phone or by letter within the next 1-3 weeks.  Please call us at [phone] if you have not heard about the biopsies in 3 weeks.    SIGNATURES/CONFIDENTIALITY: You and/or your care partner have signed paperwork which will be entered into your electronic medical record.  These signatures attest to the fact that that the information above on your After Visit Summary has been reviewed and is understood.  Full responsibility of the confidentiality of this discharge information lies with you and/or your  care-partner.

## 2019-06-27 NOTE — Op Note (Signed)
[facility] Patient Name: [name] Procedure Date: 06/27/2019 3:01 PM MRN: [id number] Endoscopist: Gatha Mayer , MD Age: 68 Referring MD:  Date of Birth: 26-Apr-1951 Gender: Male Account #: [id number] Procedure:                Colonoscopy Indications:              High risk colon cancer surveillance: Personal                            history of colonic polyps Medicines:                Propofol per Anesthesia, Monitored Anesthesia Care Procedure:                Pre-Anesthesia Assessment:                           - Prior to the procedure, a History and Physical                            was performed, and patient medications and                            allergies were reviewed. The patient's tolerance of                            previous anesthesia was also reviewed. The risks                            and benefits of the procedure and the sedation                            options and risks were discussed with the patient.                            All questions were answered, and informed consent                            was obtained. Prior Anticoagulants: The patient has                            taken no previous anticoagulant or antiplatelet                            agents. ASA Grade Assessment: III - A patient with                            severe systemic disease. After reviewing the risks                            and benefits, the patient was deemed in                            satisfactory condition to undergo the procedure.  After obtaining informed consent, the colonoscope                            was passed under direct vision. Throughout the                            procedure, the patient's blood pressure, pulse, and                            oxygen saturations were monitored continuously. The                            Colonoscope was introduced through the anus and                            advanced to the  the cecum, identified by                            appendiceal orifice and ileocecal valve. The                            colonoscopy was performed without difficulty. The                            patient tolerated the procedure well. The quality                            of the bowel preparation was good. The ileocecal                            valve, appendiceal orifice, and rectum were                            photographed. Scope In: 3:42:24 PM Scope Out: 3:55:49 PM Scope Withdrawal Time: 0 hours 11 minutes 50 seconds  Total Procedure Duration: 0 hours 13 minutes 25 seconds  Findings:                 The perianal and digital rectal examinations were                            normal.                           Two sessile polyps were found in the rectum and                            sigmoid colon. The polyps were diminutive in size.                            These polyps were removed with a cold snare.                            Resection and retrieval were complete. Verification  of patient identification for the specimen was                            done. Estimated blood loss was minimal.                           Multiple diverticula were found in the left colon.                           The exam was otherwise without abnormality on                            direct and retroflexion views. Complications:            No immediate complications. Estimated Blood Loss:     Estimated blood loss was minimal. Impression:               - Two diminutive polyps in the rectum and in the                            sigmoid colon, removed with a cold snare. Resected                            and retrieved.                           - Diverticulosis in the left colon.                           - The examination was otherwise normal on direct                            and retroflexion views.                           cc: Mina Marble, NP Recommendation:            - Patient has a contact number available for                            emergencies. The signs and symptoms of potential                            delayed complications were discussed with the                            patient. Return to normal activities tomorrow.                            Written discharge instructions were provided to the                            patient.                           - Resume previous diet.                           -  Continue present medications.                           - No repeat colonoscopy due to age, only 2 tiny                            polyps and co-morbidities. Gatha Mayer, MD 06/27/2019 4:11:35 PM This report has been signed electronically.

## 2019-06-27 NOTE — Op Note (Signed)
[facility] Patient Name: [name] Procedure Date: 06/27/2019 3:01 PM MRN: [id number] Endoscopist: Gatha Mayer , MD Age: 68 Referring MD:  Date of Birth: [birth year] Gender: Male Account #: [id number] Procedure:                Upper GI endoscopy Indications:              Anemia Medicines:                Propofol per Anesthesia, Monitored Anesthesia Care Procedure:                Pre-Anesthesia Assessment:                           - Prior to the procedure, a History and Physical                            was performed, and patient medications and                            allergies were reviewed. The patient's tolerance of                            previous anesthesia was also reviewed. The risks                            and benefits of the procedure and the sedation                            options and risks were discussed with the patient.                            All questions were answered, and informed consent                            was obtained. Prior Anticoagulants: The patient has                            taken no previous anticoagulant or antiplatelet                            agents. ASA Grade Assessment: III - A patient with                            severe systemic disease. After reviewing the risks                            and benefits, the patient was deemed in                            satisfactory condition to undergo the procedure.                           After obtaining informed consent, the endoscope was  passed under direct vision. Throughout the                            procedure, the patient's blood pressure, pulse, and                            oxygen saturations were monitored continuously. The                            Endoscope was introduced through the mouth, and                            advanced to the second part of duodenum. The upper                            GI endoscopy was  accomplished without difficulty.                            The patient tolerated the procedure well. Scope In: Scope Out: Findings:                 Patchy mild inflammation characterized by erythema,                            friability and granularity was found in the gastric                            antrum. Biopsies were taken with a cold forceps for                            histology. Verification of patient identification                            for the specimen was done. Estimated blood loss was                            minimal.                           The exam was otherwise without abnormality.                           The cardia and gastric fundus were normal on                            retroflexion. Complications:            No immediate complications. Estimated Blood Loss:     Estimated blood loss was minimal. Impression:               - Gastritis. Biopsied.                           - The examination was otherwise normal. Recommendation:           - Patient has a contact number available for  emergencies. The signs and symptoms of potential                            delayed complications were discussed with the                            patient. Return to normal activities tomorrow.                            Written discharge instructions were provided to the                            patient.                           - Resume previous diet.                           - Continue present medications.                           - Await pathology results. I think next step is to                            refer to hematology re: macrocytic anemia                           - See the other procedure note for documentation of                            additional recommendations.                           cc: Mina Marble, NP Gatha Mayer, MD 06/27/2019 4:06:38 PM This report has been signed electronically.

## 2019-06-27 NOTE — Progress Notes (Signed)
Called to room to assist during endoscopic procedure.  Patient ID and intended procedure confirmed with present staff. Received instructions for my participation in the procedure from the performing physician.  

## 2019-06-27 NOTE — Progress Notes (Signed)
PT taken to PACU. Monitors in place. VSS. Report given to RN. 

## 2019-06-29 ENCOUNTER — Telehealth: Payer: Self-pay | Admitting: *Deleted

## 2019-06-29 NOTE — Telephone Encounter (Signed)
  Follow up Call-  Call back number 06/27/2019  Post procedure Call Back phone  # [phone]  Permission to leave phone message Yes  Some recent data might be hidden     Patient questions:  Do you have a fever, pain , or abdominal swelling? No. Pain Score  0 *  Have you tolerated food without any problems? Yes.    Have you been able to return to your normal activities? Yes.    Do you have any questions about your discharge instructions: Diet   No. Medications  No. Follow up visit  No.  Do you have questions or concerns about your Care? No.  Actions: * If pain score is 4 or above: No action needed, pain <4.  1. Have you developed a fever since your procedure? no  2.   Have you had an respiratory symptoms (SOB or cough) since your procedure? no  3.   Have you tested positive for COVID 19 since your procedure no  4.   Have you had any family members/close contacts diagnosed with the COVID 19 since your procedure?  no   If yes to any of these questions please route to Joylene John, RN and Alphonsa Gin, Therapist, sports.

## 2019-06-30 NOTE — Progress Notes (Signed)
Cardiology Office Note   Date:  07/05/2019   ID:  [name], DOB [birth year], MRN [id number]  PCP:  [name] Grandchild, NP  Cardiologist:   Dorris Carnes, MD    F?U of CAD     History of Present Illness: [name] is a 68 y.o. adult with a history of ofcoronary artery disease  Last cardiac catheterization in Novembere 2015 showed:  a widely patent previously placed 4.018 mm bare-metal stent in the proximal LAD without restenosis in an otherwise normal LAD and circumflex system. Dominant RCA with  99% stenosis proximal to the mid vessel .Normal LV function without focal wall motion abnormalities LVEF 60%. The patinet underwent PCI to the subtotal occlusion of the RCA with 99% stenosis being reduced to 0% with ultimate insertion of a Xience Alpine 4.018 mm DES stent post dilated to 4.49 mm.  I saw the Spring 2019 he was seen earlier this fall been to get had some peripheral edema felt due to steroids started on diuretics.  He was also seen by Pecolia Ades in October. She was last seen in cardiolgy clinic in Oct 2020 by [name]    Since seen he denies chest pain.  His peripheral edema has improved with the Lasix he was taking now taking  once a day.  Occasionally he has taken twice a day.  He says he is watching the salt that he eats or trying to.  Current Meds  Medication Sig  . aspirin EC 81 MG EC tablet Take 1 tablet (81 mg total) by mouth daily.  Marland Kitchen atenolol (TENORMIN) 25 MG tablet Take 1 tablet by mouth twice daily  . atorvastatin (LIPITOR) 10 MG tablet Take 1 tablet by mouth once daily  . b complex vitamins tablet Take 1 tablet by mouth daily.  . betamethasone dipropionate (DIPROLENE) 0.05 % cream Apply 1 application topically every other day.  . cholecalciferol (VITAMIN D) 1000 units tablet Take 1,000 Units by mouth daily.  . Coenzyme Q10 (CO Q 10) 100 MG CAPS Take 100 mg by mouth daily.   . diphenhydrAMINE (BENADRYL) 25 MG tablet Take 50 mg by mouth at bedtime.  . DULoxetine  (CYMBALTA) 30 MG capsule Take 1 capsule (30 mg total) by mouth daily for 30 days, THEN 2 capsules (60 mg total) daily.  . folic acid (FOLVITE) 175 MCG tablet Take 800 mcg by mouth daily.   . furosemide (LASIX) 40 MG tablet TAKE 1 TABLET BY MOUTH FOR 3 DAYS THEN TAKE ONLY AS NEEDED FOR EDEMA  . glucosamine-chondroitin 500-400 MG tablet Take 2 tablets by mouth daily.   Marland Kitchen ibuprofen (ADVIL,MOTRIN) 800 MG tablet Take 800 mg by mouth daily.   Javier Docker Oil 500 MG CAPS Take 500 mg by mouth daily.   Marland Kitchen lisinopril (ZESTRIL) 10 MG tablet Take 1 tablet by mouth once daily  . Melatonin 10 MG TABS Take 20 mg by mouth at bedtime.  . potassium chloride (K-DUR) 10 MEQ tablet TAKE 1 TABLET BY MOUTH FOR 3 DAYS THEN TAKE ONLY AS NEEDED WHEN YOU TAKE A LASIX  . Saw Palmetto, Serenoa repens, (SAW PALMETTO PO) Take 300 mg by mouth daily.  Marland Kitchen triamcinolone cream (KENALOG) 0.1 % Apply 1 application topically as directed.     Allergies:   Patient has no known allergies.   Past Medical History:  Diagnosis Date  . Coronary artery disease    a. s/p BMS to LAD (1998) b. s/p DES to RCA (06/24/14)  . Dyspnea   .  HTN (hypertension)   . Hyperlipidemia   . Myositis    chronic CK elevation ( 415 CK, ALL MM CK subtype, seen by Rheumatology and considered normal Variant)  . Palatal fistula   . Palpable abd. aorta   . Pneumonia   . RBBB   . Spinal stenosis    Dr [name]  surgery  . Weakness     Past Surgical History:  Procedure Laterality Date  . CERVICAL FUSION  09/2009  . CORONARY ANGIOPLASTY    . CORONARY STENT PLACEMENT  06/24/2014   DES to RCA  . EYE SURGERY     Lasik  . HYDROCELE EXCISION    . LEFT HEART CATHETERIZATION WITH CORONARY ANGIOGRAM N/A 06/24/2014   Procedure: LEFT HEART CATHETERIZATION WITH CORONARY ANGIOGRAM;  Surgeon: [name] Sine, MD;  Location: [name];  Service: Cardiovascular;  Laterality: N/A;  . LUMBAR SPINE SURGERY  5/13  . MUSCLE BIOPSY Left 07/11/2018   Procedure: Left  Vastus lateralus muscle biopsy;  Surgeon: Judith Part, MD;  Location: [place];  Service: Neurosurgery;  Laterality: Left;  Left Vastus lateralus muscle biopsy  . PERCUTANEOUS CORONARY STENT INTERVENTION (PCI-S)  06/24/2014   Procedure: PERCUTANEOUS CORONARY STENT INTERVENTION (PCI-S);  Surgeon: [name] Sine, MD;  Location[facility];  Service: Cardiovascular;;  Distal RCA     Social History:  The patient  reports that he quit smoking about 29 years ago. His smoking use included cigarettes. He has a 17.25 pack-year smoking history. He has never used smokeless tobacco. He reports current alcohol use. He reports that he does not use drugs.   Family History:  The patient's family history includes Aneurysm in his father; Cancer in his mother; Cystic fibrosis in his brother; Heart attack in his mother; Heart disease in his father.    ROS:  Please see the history of present illness. All other systems are reviewed and  Negative to the above problem except as noted.    PHYSICAL EXAM: VS:  BP 112/70   Pulse 74   Ht 5' 11"  (1.803 m)   Wt 296 lb 12.8 oz (134.6 kg)   SpO2 98%   BMI 41.40 kg/m   GEN: Morbidly obese 68 yo in NAD   HEENT: normal  Neck: JVP is normal.  No carotid bruits,  Cardiac: RRR; no murmurs, rubs, ,Tr to 1+ edema  Respiratory:  clear to auscultation bilaterally, normal work of breathing BU:LAGT  Distended   BS  No hepatomegaly  MS: no deformity Moving all extremities   Skin: warm and dry, no rash  Erythema in legs  Scab over r knee Neuro:  Strength and sensation are intact Psych: euthymic mood, full affect   EKG:  EKG is not ordered today.        Lipid Panel    Component Value Date/Time   CHOL 136 04/20/2018 0853   TRIG 69 04/20/2018 0853   HDL 42 04/20/2018 0853   CHOLHDL 3.2 04/20/2018 0853   CHOLHDL 3.3 11/13/2015 1107   VLDL 26 11/13/2015 1107   LDLCALC 80 04/20/2018 0853   LDLDIRECT 65 05/16/2019 1400      Wt Readings from Last 3 Encounters:   07/05/19 296 lb 12.8 oz (134.6 kg)  06/27/19 298 lb (135.2 kg)  06/12/19 298 lb 6 oz (135.3 kg)      ASSESSMENT AND PLAN:  1  CAD  No symtpoms to sugg angina I would continue to follow.  In regards to his lower extremity  edema, volume is up a little bit today we will check labs since he is taking Lasix every day (B met, BNP) again discussed salt intake.  I would not schedule any other testing today.  2  HL   keep on atorvastatin 3  HTN  BP is good.  He denies dizziness.  He did have problems with hypotension might have been due to a pain med earlier this fall.  4 Morbid obesity   the patient said he lost about 30 pounds from the [place] but had to stop because of Covid.  He is planning to go out [place] to [place] probably want diet while he is there.  Wants to resume when he gets back.  5  Myopathy   Limits acitvity    F/U in later summer.   Current medicines are reviewed at length with the patient today.  The patient does not have concerns regarding medicines.  Signed, Dorris Carnes, MD  07/05/2019 11:53 AM    [address], [place], [place]  [address] Phone: [phone]; Fax: [phone]

## 2019-07-02 ENCOUNTER — Telehealth: Payer: Self-pay | Admitting: Internal Medicine

## 2019-07-02 NOTE — Telephone Encounter (Signed)
Pt reported that she has been experiencing loose, dark stools.  She stated that she started taking 325 mg ferrous sulfate and read that iron could be the cause of dark stools.  He would like to discuss with a nurse.

## 2019-07-02 NOTE — Telephone Encounter (Signed)
Pt states he tried some iron pills over the weekend and had dark stools with diarrhea. Discussed with him the iron does cause dark stools and can cause GI upset. Pt will stop the iron and see if symptoms resolve. Pt knows to call back if he continues to have any problems.

## 2019-07-03 ENCOUNTER — Encounter: Payer: Self-pay | Admitting: Student in an Organized Health Care Education/Training Program

## 2019-07-04 ENCOUNTER — Other Ambulatory Visit: Payer: Self-pay

## 2019-07-04 ENCOUNTER — Ambulatory Visit
Payer: Medicare Other | Attending: Student in an Organized Health Care Education/Training Program | Admitting: Student in an Organized Health Care Education/Training Program

## 2019-07-04 ENCOUNTER — Encounter: Payer: Self-pay | Admitting: Student in an Organized Health Care Education/Training Program

## 2019-07-04 DIAGNOSIS — M47816 Spondylosis without myelopathy or radiculopathy, lumbar region: Secondary | ICD-10-CM

## 2019-07-04 DIAGNOSIS — M5136 Other intervertebral disc degeneration, lumbar region: Secondary | ICD-10-CM | POA: Diagnosis not present

## 2019-07-04 DIAGNOSIS — Z981 Arthrodesis status: Secondary | ICD-10-CM

## 2019-07-04 DIAGNOSIS — G894 Chronic pain syndrome: Secondary | ICD-10-CM

## 2019-07-04 DIAGNOSIS — G8929 Other chronic pain: Secondary | ICD-10-CM

## 2019-07-04 DIAGNOSIS — M5416 Radiculopathy, lumbar region: Secondary | ICD-10-CM

## 2019-07-04 DIAGNOSIS — M47812 Spondylosis without myelopathy or radiculopathy, cervical region: Secondary | ICD-10-CM

## 2019-07-04 MED ORDER — DULOXETINE HCL 30 MG PO CPEP: ORAL_CAPSULE | ORAL | 0 refills | Status: DC

## 2019-07-04 NOTE — Progress Notes (Signed)
Pain Management Virtual Encounter Note - Virtual Visit via Telephone Telehealth (real-time audio visits between healthcare provider and patient).   Patient's Phone No. & Preferred Pharmacy:  [phone] (home); [phone] (mobile); (Preferred) [phone] [email or web address]  [place], [address] [address] Phone: [phone] Fax: [phone]    Pre-screening note:  Our staff contacted Mr. Newby and offered him an "in person", "face-to-face" appointment versus a telephone encounter. He indicated preferring the telephone encounter, at this time.   Reason for Virtual Visit: COVID-19*  Social distancing based on CDC and AMA recommendations.   I contacted [name] on 07/04/2019 via telephone.      I clearly identified myself as Gillis Santa, MD. I verified that I was speaking with the correct person using two identifiers (Name: Sharod Zimmerly, and date of birth: [birth year]).  Advanced Informed Consent I sought verbal advanced consent from [name] for virtual visit interactions. I informed Mr. Shuck of possible security and privacy concerns, risks, and limitations associated with providing "not-in-person" medical evaluation and management services. I also informed Mr. Loporto of the availability of "in-person" appointments. Finally, I informed him that there would be a charge for the virtual visit and that he could be  personally, fully or partially, financially responsible for it. Mr. Schoof expressed understanding and agreed to proceed.   Historic Elements   Mr. [name] Mcannally is a 68 y.o. year old, adult patient evaluated today after his last encounter by our practice on 06/05/2019. Mr. Sando  has a past medical history of Coronary artery disease, Dyspnea, HTN (hypertension), Hyperlipidemia, Myositis, Palatal fistula, Palpable abd. aorta, Pneumonia, RBBB, Spinal stenosis, and Weakness. He also  has a past surgical history that  includes Cervical fusion (09/2009); Coronary angioplasty; Lumbar spine surgery (5/13); Coronary stent placement (06/24/2014); left heart catheterization with coronary angiogram (N/A, 06/24/2014); percutaneous coronary stent intervention (pci-s) (06/24/2014); Hydrocele surgery; Eye surgery; and Muscle biopsy (Left, 07/11/2018). Mr. Dacko has a current medication list which includes the following prescription(s): aspirin, atenolol, atorvastatin, b complex vitamins, betamethasone dipropionate, cholecalciferol, co q 10, diphenhydramine, folic acid, furosemide, glucosamine-chondroitin, ibuprofen, krill oil, lisinopril, potassium chloride, saw palmetto (serenoa repens), duloxetine, melatonin, and triamcinolone cream. He  reports that he quit smoking about 29 years ago. His smoking use included cigarettes. He has a 17.25 pack-year smoking history. He has never used smokeless tobacco. He reports current alcohol use. He reports that he does not use drugs. Mr. Slivinski has No Known Allergies.   HPI  Today, he is being contacted for a post-procedure assessment.   Evaluation of last interventional procedure  06/04/2019 Procedure:   Type: Lumbar Facet, Medial Branch Block(s) #1  Primary Purpose: Diagnostic Region: Posterolateral Lumbosacral Spine Level:  L3, L4, L5, & S1 Medial Branch Level(s). Injecting these levels blocks the L3-4, L4-5, and L5-S1 lumbar facet joints. Laterality: Bilateral  Sedation: Please see nurses note for DOS. When no sedatives are used, the analgesic levels obtained are directly associated to the effectiveness of the local anesthetics. However, when sedation is provided, the level of analgesia obtained during the initial 1 hour following the intervention, is believed to be the result of a combination of factors. These factors may include, but are not limited to: 1. The effectiveness of the local anesthetics used. 2. The effects of the analgesic(s) and/or anxiolytic(s) used. 3. The degree of  discomfort experienced by the patient at the time of the procedure. 4. The patients ability and reliability in recalling  and recording the events. 5. The presence and influence of possible secondary gains and/or psychosocial factors. Reported result: Relief experienced during the 1st hour after the procedure: 0 % (Ultra-Short Term Relief)              Effects of local anesthetic: The analgesic effects attained during this period are directly associated to the localized infiltration of local anesthetics and therefore cary significant diagnostic value as to the etiological location, or anatomical origin, of the pain. Expected duration of relief is directly dependent on the pharmacodynamics of the local anesthetic used. Long-acting (4-6 hours) anesthetics used.  Reported result: Relief during the next 4 to 6 hour after the procedure: 0 % (Short-Term Relief)             Long-term benefit: Defined as the period of time past the expected duration of local anesthetics (1 hour for short-acting and 4-6 hours for long-acting). With the possible exception of prolonged sympathetic blockade from the local anesthetics, benefits during this period are typically attributed to, or associated with, other factors such as analgesic sensory neuropraxia, antiinflammatory effects, or beneficial biochemical changes provided by agents other than the local anesthetics.  Reported result: Extended relief following procedure: 0 % (Long-Term Relief)            Interpretative annotation: Unfortunately, no benefit from diagnostic lumbar facet blocks.  UDS:  Summary  Date Value Ref Range Status  05/23/2019 Note  Final    Comment:    ==================================================================== Compliance Drug Analysis, Ur ==================================================================== Test                             Result       Flag       Units Drug Present   Trazodone                      PRESENT   1,3  chlorophenyl piperazine    PRESENT    1,3-chlorophenyl piperazine is an expected metabolite of trazodone.   Acetaminophen                  PRESENT   Salicylate                     PRESENT   Ibuprofen                      PRESENT   Atenolol                       PRESENT ==================================================================== Test                      Result    Flag   Units      Ref Range   Creatinine              181              mg/dL      >=20 ==================================================================== Declared Medications:  Medication list was not provided. ==================================================================== For clinical consultation, please call [phone]. ====================================================================    Laboratory Chemistry Profile (12 mo)  Renal: 04/26/2019: BUN 30; BUN/Creatinine Ratio 29; Creatinine, Ser 1.04  Lab Results  Component Value Date   GFR 85.18 03/25/2015   GFRAA 84 04/26/2019   GFRNONAA 72 04/26/2019   Hepatic: 12/05/2018: Albumin 4.1 Lab Results  Component Value Date   AST 26 12/05/2018  ALT 32 12/05/2018   Other: 05/16/2019: Vitamin B-12 668 Note: Above Lab results reviewed.  Assessment  The primary encounter diagnosis was Lumbar facet arthropathy. Diagnoses of Lumbar spondylosis, Lumbar degenerative disc disease, Chronic radicular lumbar pain, Cervical facet joint syndrome, S/P cervical spinal fusion, and Chronic pain syndrome were also pertinent to this visit.  Plan of Care   I have discontinued [name] "Bob"'s nitroGLYCERIN, traZODone, gabapentin, and tiZANidine. I am also having him start on DULoxetine. Additionally, I am having him maintain his b complex vitamins, Krill Oil, Co Q 10, folic acid, aspirin, ibuprofen, cholecalciferol, triamcinolone cream, (Saw Palmetto, Serenoa repens, (SAW PALMETTO PO)), glucosamine-chondroitin, Melatonin, diphenhydrAMINE, betamethasone dipropionate,  lisinopril, furosemide, potassium chloride, atorvastatin, and atenolol.  Unfortunately, no benefit after diagnostic lumbar facet medial branch nerve blocks performed on 06/04/2019.  Furthermore, patient had to discontinue gabapentin due to lower extremity swelling.  He also states that tizanidine was affecting his blood pressure so he discontinued.  In regards to future medication considerations, would like to avoid Lyrica as patient did have lower extremity swelling with gabapentin.  Patient has not tried Cymbalta in the past.  He is not on any other serotonin agents.  Risks and benefits of this medication were discussed.  Start at 30 mg daily for 1 month and then increase to 60 mg daily.  Pharmacotherapy (Medications Ordered): Meds ordered this encounter  Medications  . DULoxetine (CYMBALTA) 30 MG capsule    Sig: Take 1 capsule (30 mg total) by mouth daily for 30 days, THEN 2 capsules (60 mg total) daily.    Dispense:  120 capsule    Refill:  0   Follow-up plan:   Return in about 10 weeks (around 09/12/2019) for Medication Management, virtual.     Bilateral L3, L4, L5, S1 facet medial branch nerve block #1 06/04/2019    Recent Visits Date Type Provider Dept  06/04/19 Procedure visit Gillis Santa, MD [facility]  05/22/19 Office Visit Gillis Santa, MD [facility]  Showing recent visits within past 90 days and meeting all other requirements   Today's Visits Date Type Provider Dept  07/04/19 Office Visit Gillis Santa, MD [facility]  Showing today's visits and meeting all other requirements   Future Appointments No visits were found meeting these conditions.  Showing future appointments within next 90 days and meeting all other requirements   I discussed the assessment and treatment plan with the patient. The patient was provided an opportunity to ask questions and all were answered. The patient agreed with the plan and demonstrated an understanding of the  instructions.  Patient advised to call back or seek an in-person evaluation if the symptoms or condition worsens.  Total duration of non-face-to-face encounter: 93minutes.  Note by: Gillis Santa, MD Date: 07/04/2019; Time: 1:18 PM  Note: This dictation was prepared with Dragon dictation. Any transcriptional errors that may result from this process are unintentional.  Disclaimer:  * Given the special circumstances of the COVID-19 pandemic, the federal government has announced that the Office for Civil Rights (OCR) will exercise its enforcement discretion and will not impose penalties on physicians using telehealth in the event of noncompliance with regulatory requirements under the [place] (HIPAA) in connection with the good faith provision of telehealth during the XX123456 national public health emergency. ([place])

## 2019-07-05 ENCOUNTER — Encounter: Payer: Self-pay | Admitting: Internal Medicine

## 2019-07-05 ENCOUNTER — Ambulatory Visit (INDEPENDENT_AMBULATORY_CARE_PROVIDER_SITE_OTHER): Payer: Medicare Other | Admitting: Internal Medicine

## 2019-07-05 ENCOUNTER — Other Ambulatory Visit: Payer: Self-pay

## 2019-07-05 VITALS — BP 112/70 | HR 74 | Ht 71.0 in | Wt 296.8 lb

## 2019-07-05 DIAGNOSIS — R600 Localized edema: Secondary | ICD-10-CM

## 2019-07-05 DIAGNOSIS — I251 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: Secondary | ICD-10-CM

## 2019-07-05 NOTE — Patient Instructions (Signed)
Medication Instructions:  No changes today *If you need a refill on your cardiac medications before your next appointment, please call your pharmacy*  Lab Work: Today: cbc, bmet, pro bnp If you have labs (blood work) drawn today and your tests are completely normal, you will receive your results only by: Marland Kitchen MyChart Message (if you have MyChart) OR . A paper copy in the mail If you have any lab test that is abnormal or we need to change your treatment, we will call you to review the results.  Testing/Procedures: none  Follow-Up: At [facility] you and your health needs are our priority.  As part of our continuing mission to provide you with exceptional heart care, we have created designated Provider Care Teams.  These Care Teams include your primary Cardiologist (physician) and Advanced Practice Providers (APPs -  Physician Assistants and Nurse Practitioners) who all work together to provide you with the care you need, when you need it.  Your next appointment:   7 month(s) (July)  The format for your next appointment:   In Person  Provider:   You may see Dorris Carnes, MD or one of the following Advanced Practice Providers on your designated Care Team:    Richardson Dopp, PA-C  [place], [place]  Daune Perch, NP   Other Instructions

## 2019-07-06 LAB — BASIC METABOLIC PANEL
BUN/Creatinine Ratio: 37 — ABNORMAL HIGH (ref 10–24)
BUN: 37 mg/dL — ABNORMAL HIGH (ref 8–27)
CO2: 21 mmol/L (ref 20–29)
Calcium: 9.9 mg/dL (ref 8.6–10.2)
Chloride: 103 mmol/L (ref 96–106)
Creatinine, Ser: 1.01 mg/dL (ref 0.76–1.27)
GFR calc Af Amer: 87 mL/min/{1.73_m2} (ref >59)
GFR calc non Af Amer: 75 mL/min/{1.73_m2} (ref >59)
Glucose: 81 mg/dL (ref 65–99)
Potassium: 5.1 mmol/L (ref 3.5–5.2)
Sodium: 141 mmol/L (ref 134–144)

## 2019-07-06 LAB — CBC
Hematocrit: 39.5 % (ref 37.5–51.0)
Hemoglobin: 13.5 g/dL (ref 13.0–17.7)
MCH: 34.2 pg — ABNORMAL HIGH (ref 26.6–33.0)
MCHC: 34.2 g/dL (ref 31.5–35.7)
MCV: 100 fL — ABNORMAL HIGH (ref 79–97)
Platelets: 128 10*3/uL — ABNORMAL LOW (ref 150–450)
RBC: 3.95 x10E6/uL — ABNORMAL LOW (ref 4.14–5.80)
RDW: 11.5 % — ABNORMAL LOW (ref 11.6–15.4)
WBC: 5.6 10*3/uL (ref 3.4–10.8)

## 2019-07-06 LAB — PRO B NATRIURETIC PEPTIDE: NT-Pro BNP: 184 pg/mL (ref 0–376)

## 2019-07-11 ENCOUNTER — Encounter: Payer: Self-pay | Admitting: Internal Medicine

## 2019-07-18 NOTE — Telephone Encounter (Signed)
error 

## 2019-07-27 HISTORY — PX: TOTAL HIP ARTHROPLASTY: SHX124

## 2019-08-21 DIAGNOSIS — M545 Low back pain: Secondary | ICD-10-CM | POA: Diagnosis not present

## 2019-08-21 DIAGNOSIS — G8929 Other chronic pain: Secondary | ICD-10-CM | POA: Diagnosis not present

## 2019-08-21 DIAGNOSIS — Z79899 Other long term (current) drug therapy: Secondary | ICD-10-CM | POA: Diagnosis not present

## 2019-08-21 DIAGNOSIS — G7241 Inclusion body myositis [IBM]: Secondary | ICD-10-CM | POA: Diagnosis not present

## 2019-09-07 DIAGNOSIS — I10 Essential (primary) hypertension: Secondary | ICD-10-CM

## 2019-09-07 DIAGNOSIS — E785 Hyperlipidemia, unspecified: Secondary | ICD-10-CM

## 2019-09-07 DIAGNOSIS — G729 Myopathy, unspecified: Secondary | ICD-10-CM

## 2019-09-07 MED ORDER — LISINOPRIL 10 MG PO TABS: 10.0000 mg | ORAL_TABLET | Freq: Every day | ORAL | 2 refills | Status: DC

## 2019-09-12 ENCOUNTER — Encounter: Payer: Self-pay | Admitting: Student in an Organized Health Care Education/Training Program

## 2019-09-13 ENCOUNTER — Encounter: Payer: Self-pay | Admitting: Student in an Organized Health Care Education/Training Program

## 2019-09-13 ENCOUNTER — Ambulatory Visit
Payer: Medicare Other | Attending: Student in an Organized Health Care Education/Training Program | Admitting: Student in an Organized Health Care Education/Training Program

## 2019-09-13 ENCOUNTER — Other Ambulatory Visit: Payer: Self-pay

## 2019-09-13 DIAGNOSIS — M47816 Spondylosis without myelopathy or radiculopathy, lumbar region: Secondary | ICD-10-CM

## 2019-09-13 DIAGNOSIS — G8929 Other chronic pain: Secondary | ICD-10-CM

## 2019-09-13 DIAGNOSIS — Z981 Arthrodesis status: Secondary | ICD-10-CM | POA: Diagnosis not present

## 2019-09-13 DIAGNOSIS — M4726 Other spondylosis with radiculopathy, lumbar region: Secondary | ICD-10-CM | POA: Diagnosis not present

## 2019-09-13 DIAGNOSIS — G894 Chronic pain syndrome: Secondary | ICD-10-CM

## 2019-09-13 MED ORDER — PREGABALIN 50 MG PO CAPS: ORAL_CAPSULE | ORAL | 0 refills | Status: DC

## 2019-09-13 MED ORDER — METHOCARBAMOL 750 MG PO TABS: 750.0000 mg | ORAL_TABLET | Freq: Two times a day (BID) | ORAL | 1 refills | Status: DC | PRN

## 2019-09-13 NOTE — Progress Notes (Signed)
Patient: [name]  Service Category: E/M  Provider: Gillis Santa, MD  DOB: Nov 01, 1950  DOS: 09/13/2019  Location: Office  MRN: [id number]  Setting: Ambulatory outpatient  Referring Provider: Esaw Grandchild, NP  Type: Established Patient  Specialty: Interventional Pain Management  PCP: Esaw Grandchild, NP  Location: Home  Delivery: TeleHealth     Virtual Encounter - Pain Management PROVIDER NOTE: Information contained herein reflects review and annotations entered in association with encounter. Interpretation of such information and data should be left to medically-trained personnel. Information provided to patient can be located elsewhere in the medical record under "Patient Instructions". Document created using STT-dictation technology, any transcriptional errors that may result from process are unintentional.    Contact & Pharmacy Preferred: [phone] Home: [phone] (home) Mobile: [phone] (mobile) E-mail: [email or web address]  [facility] [address], [place] [address] Phone: [phone] Fax: [phone]  [facility] [address], [place] [address] Phone: [phone] Fax: [phone]   Pre-screening  [name] offered "in-person" vs "virtual" encounter. He indicated preferring virtual for this encounter.   Reason COVID-19*  Social distancing based on CDC and AMA recommendations.   I contacted [name] on 09/13/2019 via telephone.      I clearly identified myself as 09/26/2019, MD. I verified that I was speaking with the correct person using two identifiers (Name: [name], and date of birth: 04-Mar-1951).  This visit was completed via telephone due to the restrictions of the COVID-19 pandemic. All issues as above were discussed and addressed but no physical exam was performed. If it was felt that the patient should be evaluated in the office, they  were directed there. The patient verbally consented to this visit. Patient was unable to complete an audio/visual visit due to Technical difficulties and/or Lack of internet. Due to the catastrophic nature of the COVID-19 pandemic, this visit was done through audio contact only.  Location of the patient: home address (see Epic for details)  Location of the provider: office  Consent I sought verbal advanced consent from 08/21/1950 for virtual visit interactions. I informed [name] of possible security and privacy concerns, risks, and limitations associated with providing "not-in-person" medical evaluation and management services. I also informed [name] of the availability of "in-person" appointments. Finally, I informed him that there would be a charge for the virtual visit and that he could be  personally, fully or partially, financially responsible for it. [name] expressed understanding and agreed to proceed.   Historic Elements   [name] is a 69 y.o. year old, adult patient evaluated today after his last contact with our practice on 07/04/2019. [name]  has a past medical history of Adenomatous colon polyp - diminutive (06/2019), Coronary artery disease, Dyspnea, HTN (hypertension), Hyperlipidemia, Myositis, Palatal fistula, Palpable abd. aorta, Pneumonia, RBBB, Spinal stenosis, and Weakness. He also  has a past surgical history that includes Cervical fusion (09/2009); Coronary angioplasty; Lumbar spine surgery (5/13); Coronary stent placement (06/24/2014); left heart catheterization with coronary angiogram (N/A, 06/24/2014); percutaneous coronary stent intervention (pci-s) (06/24/2014); Hydrocele surgery; Eye surgery; and Muscle biopsy (Left, 07/11/2018). [name] has a current medication list which includes the following prescription(s): aspirin, atenolol, atorvastatin, b complex vitamins, betamethasone dipropionate, cholecalciferol, co q 10, diphenhydramine, diphenhydramine, folic acid,  furosemide, glucosamine-chondroitin, ibuprofen, krill oil, lisinopril, melatonin, potassium chloride, saw palmetto (serenoa repens), triamcinolone cream, methocarbamol, and pregabalin.  He  reports that he quit smoking about 30 years ago. His smoking use included cigarettes. He has a 17.25 pack-year smoking history. He has never used smokeless tobacco. He reports current alcohol use. He reports that he does not use drugs. [name] has No Known Allergies.   HPI  Today, he is being contacted for medication management.   No benefit with Cymbalta even at dose of 60 mg. Will have patient reduce to 30 mg for 3-4 days and then discontinue.  Have pain with walking. No pain with sitting, but severe pain with standing and walking around.  UDS:  Summary  Date Value Ref Range Status  05/23/2019 Note  Final    Comment:    ==================================================================== Compliance Drug Analysis, Ur ==================================================================== Test                             Result       Flag       Units Drug Present   Trazodone                      PRESENT   1,3 chlorophenyl piperazine    PRESENT    1,3-chlorophenyl piperazine is an expected metabolite of trazodone.   Acetaminophen                  PRESENT   Salicylate                     PRESENT   Ibuprofen                      PRESENT   Atenolol                       PRESENT ==================================================================== Test                      Result    Flag   Units      Ref Range   Creatinine              181              mg/dL      >=20 ==================================================================== Declared Medications:  Medication list was not provided. ==================================================================== For clinical consultation, please call [phone]. ====================================================================    Laboratory  Chemistry Profile   Renal Lab Results  Component Value Date   BUN 37 (H) 07/05/2019   CREATININE 1.01 07/05/2019   BCR 37 (H) 07/05/2019   GFR 85.18 03/25/2015   GFRAA 87 07/05/2019   GFRNONAA 75 07/05/2019    Hepatic Lab Results  Component Value Date   AST 26 12/05/2018   ALT 32 12/05/2018   ALBUMIN 4.1 12/05/2018   ALKPHOS 49 12/05/2018    Electrolytes Lab Results  Component Value Date   NA 141 07/05/2019   K 5.1 07/05/2019   CL 103 07/05/2019   CALCIUM 9.9 07/05/2019    Bone No results found for: VD25OH, VD125OH2TOT, UJ8119JY7, WG9562ZH0, 25OHVITD1, 25OHVITD2, 25OHVITD3, TESTOFREE, TESTOSTERONE  Inflammation (CRP: Acute Phase) (ESR: Chronic Phase) No results found for: CRP, ESRSEDRATE, LATICACIDVEN    Note: Above Lab results reviewed.  Imaging  Fluoro (C-Arm) (<60 min) (No Report) Fluoro was used, but no Radiologist interpretation will be provided.  Please refer to "NOTES" tab for provider progress note.  Assessment  The primary encounter diagnosis was Chronic radicular lumbar pain. Diagnoses  of S/P cervical spinal fusion, Lumbar spondylosis, and Chronic pain syndrome were also pertinent to this visit.  Plan of Care  [name] has a current medication list which includes the following long-term medication(s): atenolol, atorvastatin, diphenhydramine, furosemide, lisinopril, potassium chloride, and pregabalin.  Unfortunately, no benefit with Cymbalta at a dose of 60 mg daily.  In fact he feels that it is causing some water retention.  He has also tried gabapentin in the past which was not effective and also caused side effects of lower extremity swelling.  At this point, we discussed trial of Lyrica to help out with his neuropathic and chronic pain.  Risks and benefits of this medication were reviewed and patient like to proceed.  We will also add Robaxin as needed as below.  Pharmacotherapy (Medications Ordered): Meds ordered this encounter  Medications  .  methocarbamol (ROBAXIN) 750 MG tablet    Sig: Take 1 tablet (750 mg total) by mouth 2 (two) times daily as needed for muscle spasms.    Dispense:  60 tablet    Refill:  1    Do not place this medication, or any other prescription from our practice, on "Automatic Refill". Patient may have prescription filled one day early if pharmacy is closed on scheduled refill date.  . pregabalin (LYRICA) 50 MG capsule    Sig: Take 1 capsule (50 mg total) by mouth at bedtime for 14 days, THEN 1 capsule (50 mg total) 2 (two) times daily.    Dispense:  86 capsule    Refill:  0   Follow-up plan:   Return in about 8 weeks (around 11/08/2019) for Medication Management.     Bilateral L3, L4, L5, S1 facet medial branch nerve block #1 06/04/2019- not helpful do not repeat     Recent Visits Date Type Provider Dept  07/04/19 Office Visit Gillis Santa, MD [facility]  Showing recent visits within past 90 days and meeting all other requirements   Today's Visits Date Type Provider Dept  09/13/19 Office Visit Gillis Santa, MD [facility]  Showing today's visits and meeting all other requirements   Future Appointments No visits were found meeting these conditions.  Showing future appointments within next 90 days and meeting all other requirements   I discussed the assessment and treatment plan with the patient. The patient was provided an opportunity to ask questions and all were answered. The patient agreed with the plan and demonstrated an understanding of the instructions.  Patient advised to call back or seek an in-person evaluation if the symptoms or condition worsens.  Duration of encounter: 16 minutes.  Note by: Gillis Santa, MD Date: 09/13/2019; Time: 11:05 AM

## 2019-09-14 ENCOUNTER — Telehealth: Payer: Self-pay

## 2019-09-14 NOTE — Telephone Encounter (Signed)
Pharmacy left a vm stating patient is trying to get out of state meds filled and she needs to speak to someone about it. Please call

## 2019-10-23 DIAGNOSIS — F5101 Primary insomnia: Secondary | ICD-10-CM | POA: Diagnosis not present

## 2019-10-23 DIAGNOSIS — Z79899 Other long term (current) drug therapy: Secondary | ICD-10-CM | POA: Diagnosis not present

## 2019-10-23 DIAGNOSIS — G7241 Inclusion body myositis [IBM]: Secondary | ICD-10-CM | POA: Diagnosis not present

## 2019-10-23 DIAGNOSIS — R600 Localized edema: Secondary | ICD-10-CM | POA: Diagnosis not present

## 2019-10-23 DIAGNOSIS — Z791 Long term (current) use of non-steroidal anti-inflammatories (NSAID): Secondary | ICD-10-CM | POA: Diagnosis not present

## 2019-11-01 ENCOUNTER — Encounter: Payer: Medicare Other | Admitting: Student in an Organized Health Care Education/Training Program

## 2020-01-02 NOTE — Telephone Encounter (Signed)
Pt would like to switch back to Zetia given hx of myositis

## 2020-01-03 ENCOUNTER — Telehealth: Payer: Self-pay | Admitting: *Deleted

## 2020-01-03 MED ORDER — EZETIMIBE 10 MG PO TABS: 10.0000 mg | ORAL_TABLET | Freq: Every day | ORAL | 3 refills | Status: DC

## 2020-01-03 NOTE — Telephone Encounter (Signed)
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  Per Dr. Harrington Challenger, stop atorvastatin and start zetia 10 mg daily.    Replied to patient's my chart message with an update. He has an appointment on 02/22/20.

## 2020-02-21 NOTE — Progress Notes (Signed)
Cardiology Office Note   Date:  02/22/2020   ID:  [name], DOB 01-24-1951, MRN [id number]  PCP:  Patient, No Pcp Per  Cardiologist:   Dorris Carnes, MD    F?U of CAD     History of Present Illness: [name] is a 69 y.o. adult with a history of ofcoronary artery disease  Last cardiac catheterization in Novembere 2015 showed:  a widely patent previously placed 4.018 mm bare-metal stent in the proximal LAD without restenosis in an otherwise normal LAD and circumflex system. Dominant RCA with  99% stenosis proximal to the mid vessel .Normal LV function without focal wall motion abnormalities LVEF 60%. The patinet underwent PCI to the subtotal occlusion of the RCA with 99% stenosis being reduced to 0% with ultimate insertion of a Xience Alpine 4.018 mm DES stent post dilated to 4.49 mm.  I saw the pt in winter 2020  The pt denies CP   Breathing is stable  Sats 92 to 97%  Has had swelling in legs that he attrib to steroid use.  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 aspirin EC 81 MG EC tablet Take 1 tablet (81 mg total) by mouth daily.  Marland Kitchen atenolol (TENORMIN) 25 MG tablet Take 1 tablet by mouth twice daily  . b complex vitamins tablet Take 1 tablet by mouth daily.  . betamethasone dipropionate (DIPROLENE) 0.05 % cream Apply 1 application topically every other day.  . cholecalciferol (VITAMIN D) 1000 units tablet Take 1,000 Units by mouth daily.  . Coenzyme Q10 (CO Q 10) 100 MG CAPS Take 100 mg by mouth daily.   . diphenhydrAMINE (BENADRYL) 25 MG tablet Take 50 mg by mouth at bedtime.  . diphenhydrAMINE (SOMINEX) 25 MG tablet Take by mouth.  . folic acid (FOLVITE) 630 MCG tablet Take 800 mcg by mouth daily.   . furosemide (LASIX) 40 MG tablet TAKE 1 TABLET BY MOUTH FOR 3 DAYS THEN TAKE ONLY AS NEEDED FOR EDEMA  . glucosamine-chondroitin 500-400 MG tablet Take 2 tablets by mouth daily.   Marland Kitchen ibuprofen (ADVIL,MOTRIN)  800 MG tablet Take 800 mg by mouth daily.   Javier Docker Oil 500 MG CAPS Take 500 mg by mouth daily.   Marland Kitchen lisinopril (ZESTRIL) 10 MG tablet Take 1 tablet (10 mg total) by mouth daily.  . Melatonin 10 MG TABS Take 20 mg by mouth at bedtime.  . methocarbamol (ROBAXIN) 750 MG tablet Take 1 tablet (750 mg total) by mouth 2 (two) times daily as needed for muscle spasms.  . potassium chloride (K-DUR) 10 MEQ tablet TAKE 1 TABLET BY MOUTH FOR 3 DAYS THEN TAKE ONLY AS NEEDED WHEN YOU TAKE A LASIX  . Saw Palmetto, Serenoa repens, (SAW PALMETTO PO) Take 300 mg by mouth daily.  Marland Kitchen triamcinolone cream (KENALOG) 0.1 % Apply 1 application topically as directed.     Allergies:   Patient has no known allergies.   Past Medical History:  Diagnosis Date  . Adenomatous colon polyp - diminutive 06/2019   no recall needed  . Coronary artery disease    a. s/p BMS to LAD (1998) b. s/p DES to RCA (06/24/14)  . Dyspnea   . HTN (hypertension)   . Hyperlipidemia   . Myositis    chronic CK elevation ( 415 CK, ALL MM CK subtype, seen by Rheumatology and considered normal Variant)  .  Palatal fistula   . Palpable abd. aorta   . Pneumonia   . RBBB   . Spinal stenosis    Dr Vaughan Sine  surgery  . Weakness     Past Surgical History:  Procedure Laterality Date  . CERVICAL FUSION  09/2009  . CORONARY ANGIOPLASTY    . CORONARY STENT PLACEMENT  06/24/2014   DES to RCA  . EYE SURGERY     Lasik  . HYDROCELE EXCISION    . LEFT HEART CATHETERIZATION WITH CORONARY ANGIOGRAM N/A 06/24/2014   Procedure: LEFT HEART CATHETERIZATION WITH CORONARY ANGIOGRAM;  Surgeon: Troy Sine, MD;  Location: [facility] CATH LAB;  Service: Cardiovascular;  Laterality: N/A;  . LUMBAR SPINE SURGERY  5/13  . MUSCLE BIOPSY Left 07/11/2018   Procedure: Left Vastus lateralus muscle biopsy;  Surgeon: Judith Part, MD;  Location: [facility];  Service: Neurosurgery;  Laterality: Left;  Left Vastus lateralus muscle biopsy  . PERCUTANEOUS CORONARY STENT  INTERVENTION (PCI-S)  06/24/2014   Procedure: PERCUTANEOUS CORONARY STENT INTERVENTION (PCI-S);  Surgeon: Troy Sine, MD;  Location: [facility] CATH LAB;  Service: Cardiovascular;;  Distal RCA     Social History:  The patient  reports that he quit smoking about 30 years ago. His smoking use included cigarettes. He has a 17.25 pack-year smoking history. He has never used smokeless tobacco. He reports current alcohol use. He reports that he does not use drugs.   Family History:  The patient's family history includes Aneurysm in his father; Cancer in his mother; Cystic fibrosis in his brother; Heart attack in his mother; Heart disease in his father.    ROS:  Please see the history of present illness. All other systems are reviewed and  Negative to the above problem except as noted.    PHYSICAL EXAM: VS:  BP (!) 118/60   Pulse 68   Ht 5\' 11"  (1.803 m)   Wt (!) 284 lb 3.2 oz (128.9 kg)   SpO2 97%   BMI 39.64 kg/m   GEN: Morbidly obese 69 yo in NAD   HEENT: normal  Neck: JVP does not appear elevated Cardiac: RRR; no murmurs,  1+ edema  Respiratory:  clear to auscultation bilaterally, normal work of breathing GE:ZMOQ  Distended  Nontender  No definite masses  MS: Moving all extremities   Neuro   Deferred  Psych: euthymic mood, full affect   EKG:  EKG is not ordered today.        Lipid Panel    Component Value Date/Time   CHOL 136 04/20/2018 0853   TRIG 69 04/20/2018 0853   HDL 42 04/20/2018 0853   CHOLHDL 3.2 04/20/2018 0853   CHOLHDL 3.3 11/13/2015 1107   VLDL 26 11/13/2015 1107   LDLCALC 80 04/20/2018 0853   LDLDIRECT 65 05/16/2019 1400      Wt Readings from Last 3 Encounters:  02/22/20 (!) 284 lb 3.2 oz (128.9 kg)  07/05/19 296 lb 12.8 oz (134.6 kg)  06/27/19 298 lb (135.2 kg)      ASSESSMENT AND PLAN:  1  CAD  No symtpoms to sugg angina I    Fluid may be increased some  Will check BMET and BNP     2  HL  Pt is having difficulties with myocyties   ? Other options  than satin   WIll check CK and lipids   3  HTN  BP is good.  He denies dizziness.    4 Morbid obesity   Difficult  wt loss given activity limitiations   5  Myopathy   Limits acitvity   Will be in touch with pt with lab results and medication adjustments  F/U in clinic in 8 months     Current medicines are reviewed at length with the patient today.  The patient does not have concerns regarding medicines.  Signed, Dorris Carnes, MD  02/22/2020 11:58 AM    [facility], [place], [place]  [address] Phone: [phone]; Fax: [phone]

## 2020-02-22 ENCOUNTER — Encounter: Payer: Self-pay | Admitting: Internal Medicine

## 2020-02-22 ENCOUNTER — Other Ambulatory Visit: Payer: Self-pay

## 2020-02-22 ENCOUNTER — Ambulatory Visit (INDEPENDENT_AMBULATORY_CARE_PROVIDER_SITE_OTHER): Payer: Medicare Other | Admitting: Internal Medicine

## 2020-02-22 VITALS — BP 118/60 | HR 68 | Ht 71.0 in | Wt 284.2 lb

## 2020-02-22 DIAGNOSIS — G729 Myopathy, unspecified: Secondary | ICD-10-CM | POA: Diagnosis not present

## 2020-02-22 DIAGNOSIS — R600 Localized edema: Secondary | ICD-10-CM

## 2020-02-22 DIAGNOSIS — I251 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: Secondary | ICD-10-CM | POA: Diagnosis not present

## 2020-02-22 NOTE — Patient Instructions (Signed)
Medication Instructions:  No changes today *If you need a refill on your cardiac medications before your next appointment, please call your pharmacy*   Lab Work: Today: bmet, bnp, lipids, ck  If you have labs (blood work) drawn today and your tests are completely normal, you will receive your results only by: Marland Kitchen MyChart Message (if you have MyChart) OR . A paper copy in the mail If you have any lab test that is abnormal or we need to change your treatment, we will call you to review the results.   Testing/Procedures: none  Follow-Up: At [facility], you and your health needs are our priority.  As part of our continuing mission to provide you with exceptional heart care, we have created designated Provider Care Teams.  These Care Teams include your primary Cardiologist (physician) and Advanced Practice Providers (APPs -  Physician Assistants and Nurse Practitioners) who all work together to provide you with the care you need, when you need it.  We recommend signing up for the patient portal called "MyChart".  Sign up information is provided on this After Visit Summary.  MyChart is used to connect with patients for Virtual Visits (Telemedicine).  Patients are able to view lab/test results, encounter notes, upcoming appointments, etc.  Non-urgent messages can be sent to your provider as well.   To learn more about what you can do with MyChart, go to [email or web address].    Your next appointment:   8 month(s)  The format for your next appointment:   In Person  Provider:   You may see Dorris Carnes, MD or one of the following Advanced Practice Providers on your designated Care Team:    [place] Dopp, PA-C  Robbie Lis, [place]    Other Instructions

## 2020-02-23 LAB — LIPID PANEL
Chol/HDL Ratio: 3.6 ratio (ref 0.0–5.0)
Cholesterol, Total: 156 mg/dL (ref 100–199)
HDL: 43 mg/dL (ref >39)
LDL Chol Calc (NIH): 92 mg/dL (ref 0–99)
Triglycerides: 117 mg/dL (ref 0–149)
VLDL Cholesterol Cal: 21 mg/dL (ref 5–40)

## 2020-02-23 LAB — BASIC METABOLIC PANEL
BUN/Creatinine Ratio: 23 (ref 10–24)
BUN: 25 mg/dL (ref 8–27)
CO2: 25 mmol/L (ref 20–29)
Calcium: 9.3 mg/dL (ref 8.6–10.2)
Chloride: 103 mmol/L (ref 96–106)
Creatinine, Ser: 1.08 mg/dL (ref 0.76–1.27)
GFR calc Af Amer: 79 mL/min/{1.73_m2} (ref >59)
GFR calc non Af Amer: 69 mL/min/{1.73_m2} (ref >59)
Glucose: 81 mg/dL (ref 65–99)
Potassium: 4.6 mmol/L (ref 3.5–5.2)
Sodium: 142 mmol/L (ref 134–144)

## 2020-02-23 LAB — PRO B NATRIURETIC PEPTIDE: NT-Pro BNP: 195 pg/mL (ref 0–376)

## 2020-02-23 LAB — CK: Total CK: 286 U/L (ref 41–331)

## 2020-02-26 ENCOUNTER — Ambulatory Visit (INDEPENDENT_AMBULATORY_CARE_PROVIDER_SITE_OTHER): Payer: Medicare Other

## 2020-02-26 ENCOUNTER — Encounter: Payer: Self-pay | Admitting: Orthopaedic Surgery

## 2020-02-26 ENCOUNTER — Ambulatory Visit (INDEPENDENT_AMBULATORY_CARE_PROVIDER_SITE_OTHER): Payer: Medicare Other | Admitting: Orthopaedic Surgery

## 2020-02-26 ENCOUNTER — Telehealth: Payer: Self-pay | Admitting: *Deleted

## 2020-02-26 VITALS — Ht 71.0 in | Wt 275.0 lb

## 2020-02-26 DIAGNOSIS — M5136 Other intervertebral disc degeneration, lumbar region: Secondary | ICD-10-CM

## 2020-02-26 DIAGNOSIS — M1611 Unilateral primary osteoarthritis, right hip: Secondary | ICD-10-CM

## 2020-02-26 DIAGNOSIS — I251 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: Secondary | ICD-10-CM | POA: Diagnosis not present

## 2020-02-26 DIAGNOSIS — M25551 Pain in right hip: Secondary | ICD-10-CM

## 2020-02-26 NOTE — Progress Notes (Signed)
Office Visit Note   Patient: [name]           Date of Birth: 1950/08/09           MRN: [id number] Visit Date: 02/26/2020              Requested by: [name], MD [place],   [id number] PCP: Patient, No Pcp Per   Assessment & Plan: Visit Diagnoses:  1. Pain in right hip   2. Lumbar degenerative disc disease   3. Unilateral primary osteoarthritis, right hip     Plan: Mr. Mccard has a number of issues in regards to his lower extremities.  He has degenerative arthritis of the lumbar spine , inclusion body myositis and arthritis of his right hip.  I think at the present time the arthritis in his right hip is the more symptomatic.  Long discussion over 45 minutes regarding all of the above.  I think the definitive procedure for his hip would be hip replacement.  However he has a number of comorbidities and his BMI is very close to 40.  We will try an intra-articular cortisone injection.  He is aware of the potential limitations.  Check him back in a month Follow-Up Instructions: Return in about 1 month (around 03/28/2020).   Orders:  Orders Placed This Encounter  Procedures  . XR Lumbar Spine 2-3 Views  . XR Pelvis 1-2 Views  . Ambulatory referral to Physical Medicine Rehab   No orders of the defined types were placed in this encounter.     Procedures: No procedures performed   Clinical Data: No additional findings.   Subjective: Chief Complaint  Patient presents with  . Right Hip - Pain  Patient presents today for right hip pain. He said that he has had right thigh pain for 3 years, but it has not bothered him. He has been diagnosed with [employer] and his muscles deteriorate. He has more recently developed pain in his right hip that radiates to his right knee, and also left hip pain. He also states that he has lower back pain. His neurologist gave him prednisone and that took away his hip pain, but not in his back. The prednisone caused his  lower legs to swell and also caused insomnia for 6 months. No numbness or tingling in his legs. He takes over the counter pain medicine as needed.  Has a long history of back and lower extremity problems.  He is being followed by the neurologist with a diagnosis of inclusion body myositis.  For at least 10 years he has had a history of weakness in both of his lower extremities which may be attributable to the above and possibly to the lumbar spine.  X-rays today demonstrate significant arthritis.  He had an MRI scan performed through the neurologist office in 2019 demonstrating severe facet hypertrophy at both L4-5 and L5-S1.  L5-S1 there was mild spinal stenosis and moderately severe bilateral foraminal narrowing and moderately severe left lateral recess stenosis.  There was a potential for bilateral L5 and S1 nerve root compression.  He underwent a series of epidural steroid injections through his neurologist and relates that he was not sure it made much of a difference.  Over the past 5 to 6 months he has had more trouble with groin anterior and lateral thigh pain as well as some discomfort in his right knee.  He does use a rolling walker.  He has trouble standing  for any length of time or walking for any distance  HPI  Review of Systems  Constitutional: Negative for fatigue.  HENT: Negative for ear pain.   Eyes: Negative for pain.  Respiratory: Negative for shortness of breath.   Cardiovascular: Positive for leg swelling.  Gastrointestinal: Negative for constipation and diarrhea.  Endocrine: Negative for cold intolerance and heat intolerance.  Genitourinary: Negative for difficulty urinating.  Musculoskeletal: Negative for joint swelling.  Skin: Negative for rash.  Allergic/Immunologic: Negative for food allergies.  Neurological: Positive for weakness.  Hematological: Does not bruise/bleed easily.  Psychiatric/Behavioral: Negative for sleep disturbance.     Objective: Vital Signs: Ht 5'  11" (1.803 m)   Wt 275 lb (124.7 kg)   BMI 38.35 kg/m   Physical Exam Constitutional:      Appearance: He is well-developed.  Eyes:     Pupils: Pupils are equal, round, and reactive to light.  Pulmonary:     Effort: Pulmonary effort is normal.  Skin:    General: Skin is warm and dry.  Neurological:     Mental Status: He is alert and oriented to person, place, and time.  Psychiatric:        Behavior: Behavior normal.     Ortho Exam awake alert and oriented x3.  Comfortable sitting.  A little hard of hearing.  No shortness of breath.  He did have pain and limitation of motion of his right hip with internal and external rotation compared to the left.  He had some very mild edema both lower extremities venous stasis changes.  May be slight effusion of his right knee.  Full extension of his right knee with flexion about 100 degrees without instability.  No percussible or palpable pain along the lateral aspect of his right or left hip.  Straight leg raise negative.  Specialty Comments:  No specialty comments available.  Imaging: XR Lumbar Spine 2-3 Views  Result Date: 02/26/2020 Films of the lumbar spine were obtained in 2 projections.  There is straightening of the normal lordotic curve.  There is diffuse degenerative disc disease with narrowing in both anterior and posterior osteophytes.  A left degenerative lumbar scoliosis.  Grade 2 anterior listhesis of L4 on 5.  Facet sclerosis significantly at 4-5 and L5-S1  XR Pelvis 1-2 Views  Result Date: 02/26/2020 AP pelvis and lateral of the right hip were obtained.  There is significant arthritis of the right hip with large subchondral cysts and subchondral sclerosis.  There is bone-on-bone in the superior aspect of the right hip joint.  Could have some element of avascular necrosis.  Milder changes of arthritis in the left hip.  Films were suboptimal based on the patient's size    PMFS History: Patient Active Problem List   Diagnosis  Date Noted  . Unilateral primary osteoarthritis, right hip 02/26/2020  . Chronic pain syndrome 09/13/2019  . Macrocytic anemia 06/12/2019  . Screening for viral disease 06/12/2019  . S/P cervical spinal fusion 05/22/2019  . Cervical facet joint syndrome 05/22/2019  . Chronic radicular lumbar pain 05/22/2019  . Lumbar degenerative disc disease 05/22/2019  . Lumbar spondylosis 05/22/2019  . Lumbar facet arthropathy 05/22/2019  . Insomnia 05/16/2019  . Bilateral lower extremity edema 04/04/2019  . BMI 37.0-37.9, adult 07/20/2018  . Encounter for Medicare annual wellness exam 04/27/2018  . Colon cancer screening 04/27/2018  . Flu vaccine need 04/27/2018  . Healthcare maintenance 02/15/2018  . Stented coronary artery   . Myositis   . Hyperlipidemia   .  Coronary artery disease   . Spinal stenosis   . Weakness   . HTN (hypertension)    Past Medical History:  Diagnosis Date  . Adenomatous colon polyp - diminutive 06/2019   no recall needed  . Coronary artery disease    a. s/p BMS to LAD (1998) b. s/p DES to RCA (06/24/14)  . Dyspnea   . HTN (hypertension)   . Hyperlipidemia   . Myositis    chronic CK elevation ( 415 CK, ALL MM CK subtype, seen by Rheumatology and considered normal Variant)  . Palatal fistula   . Palpable abd. aorta   . Pneumonia   . RBBB   . Spinal stenosis    Dr Vaughan Sine  surgery  . Weakness     Family History  Problem Relation Age of Onset  . Heart attack Mother   . Cancer Mother        esophageal  . Aneurysm Father   . Heart disease Father   . 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 Stroke Neg Hx     Past Surgical History:  Procedure Laterality Date  . CERVICAL FUSION  09/2009  . CORONARY ANGIOPLASTY    . CORONARY STENT PLACEMENT  06/24/2014   DES to RCA  . EYE SURGERY     Lasik  . HYDROCELE EXCISION    . LEFT HEART CATHETERIZATION WITH CORONARY ANGIOGRAM N/A 06/24/2014    Procedure: LEFT HEART CATHETERIZATION WITH CORONARY ANGIOGRAM;  Surgeon: Troy Sine, MD;  Location: [facility] CATH LAB;  Service: Cardiovascular;  Laterality: N/A;  . LUMBAR SPINE SURGERY  5/13  . MUSCLE BIOPSY Left 07/11/2018   Procedure: Left Vastus lateralus muscle biopsy;  Surgeon: Judith Part, MD;  Location: [place];  Service: Neurosurgery;  Laterality: Left;  Left Vastus lateralus muscle biopsy  . PERCUTANEOUS CORONARY STENT INTERVENTION (PCI-S)  06/24/2014   Procedure: PERCUTANEOUS CORONARY STENT INTERVENTION (PCI-S);  Surgeon: Troy Sine, MD;  Location[facility] LAB;  Service: Cardiovascular;;  Distal RCA   Social History   Occupational History  . Not on file  Tobacco Use  . Smoking status: Former Smoker    Packs/day: 0.75    Years: 23.00    Pack years: 17.25    Types: Cigarettes    Quit date: 07/26/1989    Years since quitting: 30.6  . Smokeless tobacco: Never Used  Vaping Use  . Vaping Use: Never used  Substance and Sexual Activity  . Alcohol use: Yes    Comment: 3-6 cans per week  . Drug use: No  . Sexual activity: Yes    Birth control/protection: None

## 2020-02-26 NOTE — Telephone Encounter (Addendum)
Pt aware of lab results.  Patient is taking Crestor 20 mg daily.  He would in agreement to switching from statin to something else if possible.  He is going to ask about the cost of zetia in a couple days when he goes into pharmacy.  It was not affordable in the past.   Crestor added to medication list.

## 2020-02-26 NOTE — Telephone Encounter (Signed)
-----   Message from Dorris Carnes V, MD sent at 02/25/2020  2:37 PM EDT ----- Electrolytes are OK Lipids  LDL 92    CK is normal now  He has a history of inclusion body myositis  Please confirm:   Is he taking Crestor?

## 2020-02-27 NOTE — Telephone Encounter (Signed)
WOuld he be willing to try rosuvastatin 10 every other day   Probably more effective than 10 lipitor every day  Check lipids in 8 wks with liver and CK

## 2020-02-27 NOTE — Telephone Encounter (Signed)
Patient sent MyChart message today. He checked his mediations.  Not taking Crestor. Taking atorvastatin 10 mg daily. Medication list updated.

## 2020-02-27 NOTE — Telephone Encounter (Signed)
I sent patient a mychart message.  Awaiting reply.

## 2020-02-28 MED ORDER — ROSUVASTATIN CALCIUM 10 MG PO TABS
10.0000 mg | ORAL_TABLET | ORAL | 3 refills | Status: DC
Start: 2020-02-28 — End: 2020-03-12

## 2020-03-06 ENCOUNTER — Ambulatory Visit: Payer: Self-pay

## 2020-03-06 ENCOUNTER — Encounter: Payer: Self-pay | Admitting: Physical Medicine and Rehabilitation

## 2020-03-06 ENCOUNTER — Other Ambulatory Visit: Payer: Self-pay

## 2020-03-06 ENCOUNTER — Ambulatory Visit (INDEPENDENT_AMBULATORY_CARE_PROVIDER_SITE_OTHER): Payer: Medicare Other | Admitting: Physical Medicine and Rehabilitation

## 2020-03-06 DIAGNOSIS — M1611 Unilateral primary osteoarthritis, right hip: Secondary | ICD-10-CM | POA: Diagnosis not present

## 2020-03-06 NOTE — Progress Notes (Signed)
° °  [name] - 69 y.o. adult MRN [id number]  Date of birth: [birth year]  Office Visit Note: Visit Date: 03/06/2020 PCP: Patient, No Pcp Per Referred by: Garald Balding, MD  Subjective: Chief Complaint  Patient presents with   Right Hip - Pain   HPI:  [name] is a 69 y.o. adult who comes in today at the request of Dr. Joni Fears for planned Right anesthetic hip arthrogram with fluoroscopic guidance.  The patient has failed conservative care including home exercise, medications, time and activity modification.  This injection will be diagnostic and hopefully therapeutic.  Please see requesting physician notes for further details and justification.  Patient had a lot of questions about inclusion body myositis and myositis in general.  I tried to answer his questions best I could bill need to follow-up with his rheumatologist.  ROS Otherwise per HPI.  Assessment & Plan: Visit Diagnoses:  1. Unilateral primary osteoarthritis, right hip     Plan: Findings:  Patient did have diagnostic relief during the anesthetic phase.    Meds & Orders: No orders of the defined types were placed in this encounter.   Orders Placed This Encounter  Procedures   Large Joint Inj: R hip joint   XR C-ARM NO REPORT    Follow-up: Return for visit to requesting physician as needed.   Procedures: Large Joint Inj: R hip joint on 03/06/2020 1:07 PM Indications: pain and diagnostic evaluation Details: 22 G needle, anterior approach  Arthrogram: Yes  Medications: 4 mL bupivacaine 0.25 %; 60 mg triamcinolone acetonide 40 MG/ML Outcome: tolerated well, no immediate complications  Arthrogram demonstrated excellent flow of contrast throughout the joint surface without extravasation or obvious defect.  The patient had relief of symptoms during the anesthetic phase of the injection.  Procedure, treatment alternatives, risks and benefits explained, specific risks discussed. Consent was given by the  patient. Immediately prior to procedure a time out was called to verify the correct patient, procedure, equipment, support staff and site/side marked as required. Patient was prepped and draped in the usual sterile fashion.      No notes on file   Clinical History: No specialty comments available.     Objective:  VS:  HT:     WT:    BMI:      BP:    HR: bpm   TEMP: ( )   RESP:  Physical Exam   Imaging: No results found.

## 2020-03-12 MED ORDER — ROSUVASTATIN CALCIUM 10 MG PO TABS
5.0000 mg | ORAL_TABLET | Freq: Every day | ORAL | 3 refills | Status: DC
Start: 2020-03-12 — End: 2020-05-26

## 2020-03-12 NOTE — Addendum Note (Signed)
Addended by: Rodman Key on: 03/12/2020 12:27 PM   Modules accepted: Orders

## 2020-03-12 NOTE — Telephone Encounter (Signed)
Patient taking Crestor 5 mg daily instead of trying to remember to take 10 mg every other day.  Medication list updated.

## 2020-03-30 ENCOUNTER — Other Ambulatory Visit: Payer: Self-pay | Admitting: Internal Medicine

## 2020-03-30 DIAGNOSIS — E782 Mixed hyperlipidemia: Secondary | ICD-10-CM

## 2020-03-30 DIAGNOSIS — G729 Myopathy, unspecified: Secondary | ICD-10-CM

## 2020-03-30 DIAGNOSIS — I10 Essential (primary) hypertension: Secondary | ICD-10-CM

## 2020-04-06 MED ORDER — TRIAMCINOLONE ACETONIDE 40 MG/ML IJ SUSP
60.0000 mg | INTRAMUSCULAR | Status: AC | PRN
  Administered 2020-03-06: 60 mg via INTRA_ARTICULAR

## 2020-04-06 MED ORDER — BUPIVACAINE HCL 0.25 % IJ SOLN
4.0000 mL | INTRAMUSCULAR | Status: AC | PRN
  Administered 2020-03-06: 4 mL via INTRA_ARTICULAR

## 2020-04-30 ENCOUNTER — Telehealth: Payer: Self-pay | Admitting: *Deleted

## 2020-04-30 NOTE — Telephone Encounter (Signed)
   [facility] Pre-operative Risk Assessment    HEARTCARE STAFF: - Please ensure there is not already an duplicate clearance open for this procedure. - Under Visit Info/Reason for Call, type in Other and utilize the format Clearance MM/DD/YY or Clearance TBD. Do not use dashes or single digits. - If request is for dental extraction, please clarify the # of teeth to be extracted.  Request for surgical clearance:  1. What type of surgery is being performed? LEFT TOTAL HIP ARTHROPLASTY DIRECT ANTERIOR APPROACH   2. When is this surgery scheduled? 05/22/20   3. What type of clearance is required (medical clearance vs. Pharmacy clearance to hold med vs. Both)? MEDICAL  4. Are there any medications that need to be held prior to surgery and how long? ASA    5. Practice name and name of physician performing surgery? [facility]; DR. Remo Lipps WERNER   6. What is the office phone number? [phone]   7.   What is the office fax number? [phone]; Bari Edward HAWKINS  8.   Anesthesia type (None, local, MAC, general) ? NOT LISTED; GENERAL; SPINAL, MAC?   Julaine Hua 04/30/2020, 8:49 AM  _________________________________________________________________   (provider comments below)

## 2020-04-30 NOTE — Telephone Encounter (Signed)
[name] 69 year old male is requesting a left total arthroplasty with anterior approach.  He was last seen in the clinic on 02/22/2020.  He was doing well at that time and denied chest pain, and shortness of breath.  May we hold his aspirin prior to his surgery?  He was contacted 04/30/2020.  He is vacationing in [place] and has no cardiac complaints at this time.  His PMH includes-coronary artery disease (cardiac catheterization 11/15 showed widely patent proximal LAD stent, normal LV function, underwent PCI with DES x1 to his RCA), hyperlipidemia, HTN, dyspnea, myositis, RBBB, and spinal stenosis.  Please direct your response to CV DIV preop pool.  Thank you for your help.  Jossie Ng. Saara Kijowski NP-C    04/30/2020, 9:17 AM [place] [address] Office [phone] Fax [phone]

## 2020-05-01 NOTE — Telephone Encounter (Signed)
OK to hold aspirin prior   Resume when safe after

## 2020-05-26 MED ORDER — ROSUVASTATIN CALCIUM 5 MG PO TABS: 5.0000 mg | ORAL_TABLET | Freq: Every day | ORAL | 1 refills | Status: DC

## 2020-06-03 DIAGNOSIS — E785 Hyperlipidemia, unspecified: Secondary | ICD-10-CM

## 2020-06-03 DIAGNOSIS — G729 Myopathy, unspecified: Secondary | ICD-10-CM

## 2020-06-03 DIAGNOSIS — I10 Essential (primary) hypertension: Secondary | ICD-10-CM

## 2020-06-03 MED ORDER — LISINOPRIL 10 MG PO TABS: 10.0000 mg | ORAL_TABLET | Freq: Every day | ORAL | 2 refills | Status: DC

## 2020-08-11 DIAGNOSIS — G7241 Inclusion body myositis [IBM]: Secondary | ICD-10-CM | POA: Diagnosis not present

## 2020-09-30 DIAGNOSIS — R200 Anesthesia of skin: Secondary | ICD-10-CM | POA: Diagnosis not present

## 2020-09-30 DIAGNOSIS — R202 Paresthesia of skin: Secondary | ICD-10-CM | POA: Diagnosis not present

## 2020-09-30 DIAGNOSIS — G5621 Lesion of ulnar nerve, right upper limb: Secondary | ICD-10-CM | POA: Diagnosis not present

## 2020-09-30 DIAGNOSIS — G7241 Inclusion body myositis [IBM]: Secondary | ICD-10-CM | POA: Diagnosis not present

## 2020-11-11 DIAGNOSIS — M7022 Olecranon bursitis, left elbow: Secondary | ICD-10-CM | POA: Diagnosis not present

## 2020-11-20 ENCOUNTER — Other Ambulatory Visit: Payer: Self-pay | Admitting: Internal Medicine

## 2020-11-28 DIAGNOSIS — M25522 Pain in left elbow: Secondary | ICD-10-CM | POA: Diagnosis not present

## 2020-11-28 DIAGNOSIS — M7022 Olecranon bursitis, left elbow: Secondary | ICD-10-CM | POA: Diagnosis not present

## 2020-12-06 DIAGNOSIS — Z23 Encounter for immunization: Secondary | ICD-10-CM | POA: Diagnosis not present

## 2020-12-26 ENCOUNTER — Encounter: Payer: Self-pay | Admitting: Internal Medicine

## 2020-12-26 ENCOUNTER — Telehealth (INDEPENDENT_AMBULATORY_CARE_PROVIDER_SITE_OTHER): Payer: Medicare Other | Admitting: Internal Medicine

## 2020-12-26 ENCOUNTER — Other Ambulatory Visit: Payer: Self-pay

## 2020-12-26 VITALS — BP 161/71 | HR 96 | Ht 71.0 in | Wt 285.0 lb

## 2020-12-26 DIAGNOSIS — G729 Myopathy, unspecified: Secondary | ICD-10-CM | POA: Diagnosis not present

## 2020-12-26 DIAGNOSIS — I251 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: Secondary | ICD-10-CM | POA: Diagnosis not present

## 2020-12-26 DIAGNOSIS — E785 Hyperlipidemia, unspecified: Secondary | ICD-10-CM | POA: Diagnosis not present

## 2020-12-26 DIAGNOSIS — R509 Fever, unspecified: Secondary | ICD-10-CM | POA: Diagnosis not present

## 2020-12-26 DIAGNOSIS — R600 Localized edema: Secondary | ICD-10-CM

## 2020-12-26 DIAGNOSIS — R5381 Other malaise: Secondary | ICD-10-CM | POA: Diagnosis not present

## 2020-12-26 NOTE — Progress Notes (Signed)
Called pt to let him know he can go into [facility] in his area [place], [place]) to have labs drawn.  Confirmed this w [employer] customer service.

## 2020-12-26 NOTE — Patient Instructions (Addendum)
Medication Instructions:  No changes *If you need a refill on your cardiac medications before your next appointment, please call your pharmacy*   Lab Work: Please go to [facility] for blood work: cbc, cmet, lipids, d dimer, ck, sed rate, bnp  YOU CAN MAKE AN APPOINTMENT ONLINE, OR YOU CAN WALK IN TO HAVE YOUR LABS DONE.  TAKE YOUR INSURANCE CARDS WITH YOU  Testing/Procedures: none  Other Instructions

## 2020-12-26 NOTE — Progress Notes (Signed)
Virtual Visit via Video Note   This visit type was conducted due to national recommendations for restrictions regarding the COVID-19 Pandemic (e.g. social distancing) in an effort to limit this patient's exposure and mitigate transmission in our community.  Due to his co-morbid illnesses, this patient is at least at moderate risk for complications without adequate follow up.  This format is felt to be most appropriate for this patient at this time.  All issues noted in this document were discussed and addressed.  A limited physical exam was performed with this format.  Please refer to the patient's chart for his consent to telehealth for [facility].       Date:  12/26/2020   ID:  [name], DOB 12-10-1950, MRN [id number] The patient was identified using 2 identifiers.  Patient Location: Home Provider Location: [facility] Cardiologist:  Dorris Carnes, MD     Evaluation Performed:  Follow-Up Visit  Chief Complaint:  F/U visit of CAD  History of Present Illness:    [name] is a 70 y.o. adult with a hx of CAD   Last cardiac catheterization in Novembere 2015 showed:  a widely patent previously placed 4.018 mm bare-metal stent in the proximal LAD without restenosis in an otherwise normal LAD and circumflex system. Dominant RCA with  99% stenosis proximal to the mid vessel .Normal LV function without focal wall motion abnormalities LVEF 60%. The patinet underwent PCI to the subtotal occlusion of the RCA with 99% stenosis being reduced to 0% with ultimate insertion of a Xience Alpine 4.018 mm DES stent post dilated to 4.49 mm.  I last saw the pt in July 2021    The pt is currently in [facility]  He says he had hip surgery last fall   Due to f/u in ortho soon  The pt says over the past few days he has had fevers, chills   No cough   Took COVID tests x 2   Both negative      He denies CP   Some increased SOB   Does note some incrased LE edema  that will not go down        Past Medical History:  Diagnosis Date  . Adenomatous colon polyp - diminutive 06/2019   no recall needed  . Coronary artery disease    a. s/p BMS to LAD (1998) b. s/p DES to RCA (06/24/14)  . Dyspnea   . HTN (hypertension)   . Hyperlipidemia   . Myositis    chronic CK elevation ( 415 CK, ALL MM CK subtype, seen by Rheumatology and considered normal Variant)  . Palatal fistula   . Palpable abd. aorta   . Pneumonia   . RBBB   . Spinal stenosis    Dr Vaughan Sine  surgery  . Weakness    Past Surgical History:  Procedure Laterality Date  . CERVICAL FUSION  09/2009  . CORONARY ANGIOPLASTY    . CORONARY STENT PLACEMENT  06/24/2014   DES to RCA  . EYE SURGERY     Lasik  . HYDROCELE EXCISION    . LEFT HEART CATHETERIZATION WITH CORONARY ANGIOGRAM N/A 06/24/2014   Procedure: LEFT HEART CATHETERIZATION WITH CORONARY ANGIOGRAM;  Surgeon: Troy Sine, MD;  Location: [facility] CATH LAB;  Service: Cardiovascular;  Laterality: N/A;  . LUMBAR SPINE SURGERY  5/13  . MUSCLE BIOPSY Left 07/11/2018   Procedure: Left Vastus lateralus muscle biopsy;  Surgeon: Emelda Brothers  A, MD;  Location: [place];  Service: Neurosurgery;  Laterality: Left;  Left Vastus lateralus muscle biopsy  . PERCUTANEOUS CORONARY STENT INTERVENTION (PCI-S)  06/24/2014   Procedure: PERCUTANEOUS CORONARY STENT INTERVENTION (PCI-S);  Surgeon: Troy Sine, MD;  Location: [facility] CATH LAB;  Service: Cardiovascular;;  Distal RCA     Current Meds  Medication Sig  . aspirin EC 81 MG EC tablet Take 1 tablet (81 mg total) by mouth daily.  Marland Kitchen atenolol (TENORMIN) 25 MG tablet Take 1 tablet by mouth twice daily  . augmented betamethasone dipropionate (DIPROLENE-AF) 0.05 % cream APPLY CREAM TOPICALLY TWICE DAILY TO AFFECTED AREAS  . b complex vitamins tablet Take 1 tablet by mouth daily.  . betamethasone dipropionate (DIPROLENE) 0.05 % cream Apply 1 application topically every other day.  . cholecalciferol  (VITAMIN D) 1000 units tablet Take 1,000 Units by mouth daily.  . Coenzyme Q10 (CO Q 10) 100 MG CAPS Take 100 mg by mouth daily.   . diphenhydrAMINE (BENADRYL) 25 MG tablet Take 50 mg by mouth at bedtime.  . folic acid (FOLVITE) 539 MCG tablet Take 800 mcg by mouth daily.   . furosemide (LASIX) 40 MG tablet TAKE 1 TABLET BY MOUTH FOR 3 DAYS THEN TAKE ONLY AS NEEDED FOR EDEMA  . glucosamine-chondroitin 500-400 MG tablet Take 2 tablets by mouth daily.   Marland Kitchen ibuprofen (ADVIL,MOTRIN) 800 MG tablet Take 800 mg by mouth daily.   Javier Docker Oil 500 MG CAPS Take 500 mg by mouth daily.   Marland Kitchen levOCARNitine (L-CARNITINE) 500 MG TABS Take 1 tablet by mouth daily.  Marland Kitchen lisinopril (ZESTRIL) 10 MG tablet Take 1 tablet (10 mg total) by mouth daily.  . Melatonin 10 MG TABS Take 20 mg by mouth at bedtime.  . potassium chloride (K-DUR) 10 MEQ tablet TAKE 1 TABLET BY MOUTH FOR 3 DAYS THEN TAKE ONLY AS NEEDED WHEN YOU TAKE A LASIX  . rosuvastatin (CRESTOR) 5 MG tablet Take 1 tablet (5 mg total) by mouth daily. Please make yearly appt with Dr. Harrington Challenger for July 2022 for future refills. Thank you 1st attempt  . Saw Palmetto, Serenoa repens, (SAW PALMETTO PO) Take 300 mg by mouth daily.  Marland Kitchen triamcinolone cream (KENALOG) 0.1 % Apply 1 application topically as directed.     Allergies:   Patient has no known allergies.   Social History   Tobacco Use  . Smoking status: Former Smoker    Packs/day: 0.75    Years: 23.00    Pack years: 17.25    Types: Cigarettes    Quit date: 07/26/1989    Years since quitting: 31.4  . Smokeless tobacco: Never Used  Vaping Use  . Vaping Use: Never used  Substance Use Topics  . Alcohol use: Yes    Comment: 3-6 cans per week  . Drug use: No     Family Hx: The patient's family history includes Aneurysm in his father; Cancer in his mother; Cystic fibrosis in his brother; Heart attack in his mother; Heart disease in his father. There is no history of Stroke.  ROS:   Please see the history of  present illness.     All other systems reviewed and are negative.   Prior CV studies:   The following studies were reviewed today:    Labs/Other Tests and Data Reviewed:    EKG:  No ECG reviewed.  Recent Labs: 02/22/2020: BUN 25; Creatinine, Ser 1.08; NT-Pro BNP 195; Potassium 4.6; Sodium 142   Recent Lipid Panel Lab  Results  Component Value Date/Time   CHOL 156 02/22/2020 12:07 PM   TRIG 117 02/22/2020 12:07 PM   HDL 43 02/22/2020 12:07 PM   CHOLHDL 3.6 02/22/2020 12:07 PM   CHOLHDL 3.3 11/13/2015 11:07 AM   LDLCALC 92 02/22/2020 12:07 PM   LDLDIRECT 65 05/16/2019 02:00 PM    Wt Readings from Last 3 Encounters:  12/26/20 285 lb (129.3 kg)  02/26/20 275 lb (124.7 kg)  02/22/20 (!) 284 lb 3.2 oz (128.9 kg)       Objective:    Vital Signs:  BP (!) 161/71   Pulse 96   Ht _0  (1.803 m)   Wt 285 lb (129.3 kg)   BMI 39.75 kg/m    Pt appears in NAD over video   NO exam done    ASSESSMENT & PLAN:    1.  Fever/ chills   COncerning WIth hx of orthopedic surgery concerning Will set up for CBC, ESR,    He has an appt soon with orthopedics  2  Hx fo CAD   I am not convinced of active angina   Follow   With SOB and LE edema will check BNEP  3   HL    Check lipds   Last values in July 2021 LDL was higher than it should be at 92  4  Myopathy   Check CK and ESR    F/U when patient is back in [place]      COVID-19 Education: The signs and symptoms of COVID-19 were discussed with the patient and how to seek care for testing (follow up with PCP or arrange E-visit).  The importance of social distancing was discussed today.  Time:   Today, I have spent 15 minutes with the patient with telehealth technology discussing the above problems.     Medication Adjustments/Labs and Tests Ordered: Current medicines are reviewed at length with the patient today.  Concerns regarding medicines are outlined above.   Tests Ordered: Orders Placed This Encounter  Procedures  . CBC   . Comprehensive metabolic panel  . Lipid panel  . D-dimer, quantitative  . CK  . Sedimentation rate  . Pro b natriuretic peptide (BNP)      Signed, Dorris Carnes, MD  12/26/2020 10:35 PM    [facility]

## 2020-12-30 ENCOUNTER — Other Ambulatory Visit: Payer: Self-pay | Admitting: *Deleted

## 2020-12-30 DIAGNOSIS — R509 Fever, unspecified: Secondary | ICD-10-CM

## 2020-12-30 DIAGNOSIS — I251 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: Secondary | ICD-10-CM

## 2020-12-30 DIAGNOSIS — G729 Myopathy, unspecified: Secondary | ICD-10-CM

## 2020-12-30 DIAGNOSIS — R600 Localized edema: Secondary | ICD-10-CM

## 2020-12-30 DIAGNOSIS — R5381 Other malaise: Secondary | ICD-10-CM

## 2020-12-30 DIAGNOSIS — E785 Hyperlipidemia, unspecified: Secondary | ICD-10-CM

## 2020-12-31 DIAGNOSIS — E785 Hyperlipidemia, unspecified: Secondary | ICD-10-CM | POA: Diagnosis not present

## 2020-12-31 DIAGNOSIS — R509 Fever, unspecified: Secondary | ICD-10-CM | POA: Diagnosis not present

## 2020-12-31 DIAGNOSIS — G729 Myopathy, unspecified: Secondary | ICD-10-CM | POA: Diagnosis not present

## 2020-12-31 DIAGNOSIS — I251 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: Secondary | ICD-10-CM | POA: Diagnosis not present

## 2020-12-31 DIAGNOSIS — R5381 Other malaise: Secondary | ICD-10-CM | POA: Diagnosis not present

## 2020-12-31 DIAGNOSIS — R600 Localized edema: Secondary | ICD-10-CM | POA: Diagnosis not present

## 2021-01-01 ENCOUNTER — Other Ambulatory Visit: Payer: Self-pay | Admitting: *Deleted

## 2021-01-01 DIAGNOSIS — R509 Fever, unspecified: Secondary | ICD-10-CM

## 2021-01-01 LAB — LIPID PANEL
Chol/HDL Ratio: 4.1 ratio (ref 0.0–5.0)
Cholesterol, Total: 116 mg/dL (ref 100–199)
HDL: 28 mg/dL — ABNORMAL LOW (ref >39)
LDL Chol Calc (NIH): 70 mg/dL (ref 0–99)
Triglycerides: 92 mg/dL (ref 0–149)
VLDL Cholesterol Cal: 18 mg/dL (ref 5–40)

## 2021-01-01 LAB — CBC
Hematocrit: 37.2 % — ABNORMAL LOW (ref 37.5–51.0)
Hemoglobin: 12.4 g/dL — ABNORMAL LOW (ref 13.0–17.7)
MCH: 33.3 pg — ABNORMAL HIGH (ref 26.6–33.0)
MCHC: 33.3 g/dL (ref 31.5–35.7)
MCV: 100 fL — ABNORMAL HIGH (ref 79–97)
Platelets: 160 10*3/uL (ref 150–450)
RBC: 3.72 x10E6/uL — ABNORMAL LOW (ref 4.14–5.80)
RDW: 12.4 % (ref 11.6–15.4)
WBC: 6.1 10*3/uL (ref 3.4–10.8)

## 2021-01-01 LAB — COMPREHENSIVE METABOLIC PANEL
ALT: 54 IU/L — ABNORMAL HIGH (ref 0–44)
AST: 45 IU/L — ABNORMAL HIGH (ref 0–40)
Albumin/Globulin Ratio: 1.6 (ref 1.2–2.2)
Albumin: 3.6 g/dL — ABNORMAL LOW (ref 3.7–4.7)
Alkaline Phosphatase: 69 IU/L (ref 44–121)
BUN/Creatinine Ratio: 27 — ABNORMAL HIGH (ref 10–24)
BUN: 25 mg/dL (ref 8–27)
Bilirubin Total: 0.3 mg/dL (ref 0.0–1.2)
CO2: 22 mmol/L (ref 20–29)
Calcium: 8.9 mg/dL (ref 8.6–10.2)
Chloride: 106 mmol/L (ref 96–106)
Creatinine, Ser: 0.91 mg/dL (ref 0.76–1.27)
Globulin, Total: 2.3 g/dL (ref 1.5–4.5)
Glucose: 110 mg/dL — ABNORMAL HIGH (ref 65–99)
Potassium: 4.4 mmol/L (ref 3.5–5.2)
Sodium: 142 mmol/L (ref 134–144)
Total Protein: 5.9 g/dL — ABNORMAL LOW (ref 6.0–8.5)
eGFR: 90 mL/min/{1.73_m2} (ref >59)

## 2021-01-01 LAB — CK: Total CK: 185 U/L (ref 41–331)

## 2021-01-01 LAB — D-DIMER, QUANTITATIVE: D-DIMER: 1.86 mg/L FEU — ABNORMAL HIGH (ref 0.00–0.49)

## 2021-01-01 LAB — SEDIMENTATION RATE: Sed Rate: 67 mm/hr — ABNORMAL HIGH (ref 0–30)

## 2021-01-02 DIAGNOSIS — M3320 Polymyositis, organ involvement unspecified: Secondary | ICD-10-CM | POA: Diagnosis not present

## 2021-01-02 DIAGNOSIS — Z79899 Other long term (current) drug therapy: Secondary | ICD-10-CM | POA: Diagnosis not present

## 2021-01-02 DIAGNOSIS — M7989 Other specified soft tissue disorders: Secondary | ICD-10-CM | POA: Diagnosis not present

## 2021-01-02 DIAGNOSIS — Z87891 Personal history of nicotine dependence: Secondary | ICD-10-CM | POA: Diagnosis not present

## 2021-01-02 DIAGNOSIS — I10 Essential (primary) hypertension: Secondary | ICD-10-CM | POA: Diagnosis not present

## 2021-01-02 DIAGNOSIS — I82412 Acute embolism and thrombosis of left femoral vein: Secondary | ICD-10-CM | POA: Diagnosis not present

## 2021-01-02 DIAGNOSIS — H9190 Unspecified hearing loss, unspecified ear: Secondary | ICD-10-CM | POA: Diagnosis not present

## 2021-01-02 DIAGNOSIS — E782 Mixed hyperlipidemia: Secondary | ICD-10-CM | POA: Diagnosis not present

## 2021-01-02 DIAGNOSIS — Z955 Presence of coronary angioplasty implant and graft: Secondary | ICD-10-CM | POA: Diagnosis not present

## 2021-01-02 DIAGNOSIS — M1990 Unspecified osteoarthritis, unspecified site: Secondary | ICD-10-CM | POA: Diagnosis not present

## 2021-01-02 DIAGNOSIS — Z96641 Presence of right artificial hip joint: Secondary | ICD-10-CM | POA: Diagnosis not present

## 2021-01-02 DIAGNOSIS — I251 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: Secondary | ICD-10-CM | POA: Diagnosis not present

## 2021-01-02 DIAGNOSIS — I82492 Acute embolism and thrombosis of other specified deep vein of left lower extremity: Secondary | ICD-10-CM | POA: Diagnosis not present

## 2021-01-02 NOTE — Telephone Encounter (Signed)
Called patient.  Confirmed he spoke with Dr. Harrington Challenger yesterday and adv him to go to to get medical treatment due to his elevated D Dimer.  Pt currently at an ER in [place], [place] and is waiting to get a chest CT.  Updated Dr. Harrington Challenger.

## 2021-01-05 NOTE — Telephone Encounter (Signed)
Called pt   Reviewed records from outside hosp   He is now on Eliquis BID  Ques:  What ppt'd this   How long to be on Eliquis   ? Lifelong     Ques if he soul have CT scan of chest   R/O PI He has appt with PCP next wk

## 2021-01-08 DIAGNOSIS — H04123 Dry eye syndrome of bilateral lacrimal glands: Secondary | ICD-10-CM | POA: Diagnosis not present

## 2021-01-08 DIAGNOSIS — H2512 Age-related nuclear cataract, left eye: Secondary | ICD-10-CM | POA: Diagnosis not present

## 2021-01-08 DIAGNOSIS — H524 Presbyopia: Secondary | ICD-10-CM | POA: Diagnosis not present

## 2021-01-08 DIAGNOSIS — H53021 Refractive amblyopia, right eye: Secondary | ICD-10-CM | POA: Diagnosis not present

## 2021-01-12 DIAGNOSIS — M47896 Other spondylosis, lumbar region: Secondary | ICD-10-CM | POA: Diagnosis not present

## 2021-01-12 DIAGNOSIS — Z96641 Presence of right artificial hip joint: Secondary | ICD-10-CM | POA: Diagnosis not present

## 2021-01-12 DIAGNOSIS — M1612 Unilateral primary osteoarthritis, left hip: Secondary | ICD-10-CM | POA: Diagnosis not present

## 2021-01-12 DIAGNOSIS — Z471 Aftercare following joint replacement surgery: Secondary | ICD-10-CM | POA: Diagnosis not present

## 2021-01-14 DIAGNOSIS — Z79899 Other long term (current) drug therapy: Secondary | ICD-10-CM | POA: Diagnosis not present

## 2021-01-14 DIAGNOSIS — G8929 Other chronic pain: Secondary | ICD-10-CM | POA: Diagnosis not present

## 2021-01-14 DIAGNOSIS — I824Y2 Acute embolism and thrombosis of unspecified deep veins of left proximal lower extremity: Secondary | ICD-10-CM | POA: Diagnosis not present

## 2021-01-14 DIAGNOSIS — M545 Low back pain, unspecified: Secondary | ICD-10-CM | POA: Diagnosis not present

## 2021-01-18 NOTE — Telephone Encounter (Signed)
Pt will need refills for anticoagulant   He is on Eliquis  If switches take ELiquis as PM dose then take Xarelto in am Fill for 1 month 5 refills

## 2021-01-19 MED ORDER — APIXABAN 5 MG PO TABS: 5.0000 mg | ORAL_TABLET | Freq: Two times a day (BID) | ORAL | 0 refills | Status: DC

## 2021-01-19 NOTE — Addendum Note (Signed)
Addended by: Marcelle Overlie D on: 01/19/2021 01:34 PM   Modules accepted: Orders

## 2021-01-19 NOTE — Addendum Note (Signed)
Addended by: Marcelle Overlie D on: 01/19/2021 02:43 PM   Modules accepted: Orders

## 2021-02-24 DIAGNOSIS — Z23 Encounter for immunization: Secondary | ICD-10-CM | POA: Diagnosis not present

## 2021-03-05 ENCOUNTER — Other Ambulatory Visit: Payer: Self-pay | Admitting: Internal Medicine

## 2021-03-05 DIAGNOSIS — I10 Essential (primary) hypertension: Secondary | ICD-10-CM

## 2021-03-05 DIAGNOSIS — E785 Hyperlipidemia, unspecified: Secondary | ICD-10-CM

## 2021-03-05 DIAGNOSIS — G729 Myopathy, unspecified: Secondary | ICD-10-CM

## 2021-04-01 ENCOUNTER — Other Ambulatory Visit: Payer: Self-pay | Admitting: Internal Medicine

## 2021-04-01 DIAGNOSIS — I10 Essential (primary) hypertension: Secondary | ICD-10-CM

## 2021-04-01 DIAGNOSIS — E782 Mixed hyperlipidemia: Secondary | ICD-10-CM

## 2021-04-01 DIAGNOSIS — G729 Myopathy, unspecified: Secondary | ICD-10-CM

## 2021-04-06 DIAGNOSIS — I10 Essential (primary) hypertension: Secondary | ICD-10-CM | POA: Diagnosis not present

## 2021-04-06 DIAGNOSIS — E782 Mixed hyperlipidemia: Secondary | ICD-10-CM | POA: Diagnosis not present

## 2021-04-06 DIAGNOSIS — Z9189 Other specified personal risk factors, not elsewhere classified: Secondary | ICD-10-CM | POA: Diagnosis not present

## 2021-04-06 DIAGNOSIS — R21 Rash and other nonspecific skin eruption: Secondary | ICD-10-CM | POA: Diagnosis not present

## 2021-04-06 DIAGNOSIS — I739 Peripheral vascular disease, unspecified: Secondary | ICD-10-CM | POA: Diagnosis not present

## 2021-04-06 DIAGNOSIS — G7241 Inclusion body myositis [IBM]: Secondary | ICD-10-CM | POA: Diagnosis not present

## 2021-04-06 DIAGNOSIS — M3320 Polymyositis, organ involvement unspecified: Secondary | ICD-10-CM | POA: Diagnosis not present

## 2021-04-06 DIAGNOSIS — S81801A Unspecified open wound, right lower leg, initial encounter: Secondary | ICD-10-CM | POA: Diagnosis not present

## 2021-04-08 DIAGNOSIS — Z86718 Personal history of other venous thrombosis and embolism: Secondary | ICD-10-CM | POA: Diagnosis not present

## 2021-04-08 DIAGNOSIS — L97812 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d: Secondary | ICD-10-CM | POA: Diagnosis not present

## 2021-04-08 DIAGNOSIS — Z79899 Other long term (current) drug therapy: Secondary | ICD-10-CM | POA: Diagnosis not present

## 2021-04-08 DIAGNOSIS — I872 Venous insufficiency (chronic) (peripheral): Secondary | ICD-10-CM | POA: Diagnosis not present

## 2021-04-08 DIAGNOSIS — I10 Essential (primary) hypertension: Secondary | ICD-10-CM | POA: Diagnosis not present

## 2021-04-08 DIAGNOSIS — I251 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: Secondary | ICD-10-CM | POA: Diagnosis not present

## 2021-04-08 DIAGNOSIS — Z7901 Long term (current) use of anticoagulants: Secondary | ICD-10-CM | POA: Diagnosis not present

## 2021-04-08 DIAGNOSIS — I82402 Acute embolism and thrombosis of unspecified deep veins of left lower extremity: Secondary | ICD-10-CM | POA: Diagnosis not present

## 2021-04-08 DIAGNOSIS — E782 Mixed hyperlipidemia: Secondary | ICD-10-CM | POA: Diagnosis not present

## 2021-04-08 DIAGNOSIS — I890 Lymphedema, not elsewhere classified: Secondary | ICD-10-CM | POA: Diagnosis not present

## 2021-04-08 DIAGNOSIS — Z955 Presence of coronary angioplasty implant and graft: Secondary | ICD-10-CM | POA: Diagnosis not present

## 2021-04-16 DIAGNOSIS — L97919 Non-pressure chronic ulcer of unspecified part of right lower leg with unspecified severity: Secondary | ICD-10-CM | POA: Diagnosis not present

## 2021-04-16 DIAGNOSIS — L97929 Non-pressure chronic ulcer of unspecified part of left lower leg with unspecified severity: Secondary | ICD-10-CM | POA: Diagnosis not present

## 2021-04-16 DIAGNOSIS — I82432 Acute embolism and thrombosis of left popliteal vein: Secondary | ICD-10-CM | POA: Diagnosis not present

## 2021-04-17 DIAGNOSIS — I872 Venous insufficiency (chronic) (peripheral): Secondary | ICD-10-CM | POA: Diagnosis not present

## 2021-04-17 DIAGNOSIS — I251 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: Secondary | ICD-10-CM | POA: Diagnosis not present

## 2021-04-17 DIAGNOSIS — E782 Mixed hyperlipidemia: Secondary | ICD-10-CM | POA: Diagnosis not present

## 2021-04-17 DIAGNOSIS — I82402 Acute embolism and thrombosis of unspecified deep veins of left lower extremity: Secondary | ICD-10-CM | POA: Diagnosis not present

## 2021-04-17 DIAGNOSIS — I890 Lymphedema, not elsewhere classified: Secondary | ICD-10-CM | POA: Diagnosis not present

## 2021-04-17 DIAGNOSIS — L97812 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d: Secondary | ICD-10-CM | POA: Diagnosis not present

## 2021-04-17 DIAGNOSIS — I10 Essential (primary) hypertension: Secondary | ICD-10-CM | POA: Diagnosis not present

## 2021-04-24 DIAGNOSIS — I251 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: Secondary | ICD-10-CM | POA: Diagnosis not present

## 2021-04-24 DIAGNOSIS — I82402 Acute embolism and thrombosis of unspecified deep veins of left lower extremity: Secondary | ICD-10-CM | POA: Diagnosis not present

## 2021-04-24 DIAGNOSIS — E782 Mixed hyperlipidemia: Secondary | ICD-10-CM | POA: Diagnosis not present

## 2021-04-24 DIAGNOSIS — I10 Essential (primary) hypertension: Secondary | ICD-10-CM | POA: Diagnosis not present

## 2021-04-24 DIAGNOSIS — I872 Venous insufficiency (chronic) (peripheral): Secondary | ICD-10-CM | POA: Diagnosis not present

## 2021-04-24 DIAGNOSIS — L97812 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d: Secondary | ICD-10-CM | POA: Diagnosis not present

## 2021-04-24 DIAGNOSIS — I890 Lymphedema, not elsewhere classified: Secondary | ICD-10-CM | POA: Diagnosis not present

## 2021-04-27 NOTE — Telephone Encounter (Signed)
The pt needs to be on a blood thinner    Has he tried Xarelto? Plavix is not adequate

## 2021-04-28 ENCOUNTER — Other Ambulatory Visit: Payer: Self-pay | Admitting: Internal Medicine

## 2021-04-28 ENCOUNTER — Telehealth: Payer: Self-pay

## 2021-04-28 DIAGNOSIS — I82409 Acute embolism and thrombosis of unspecified deep veins of unspecified lower extremity: Secondary | ICD-10-CM

## 2021-04-28 NOTE — Telephone Encounter (Signed)
Have patient hold Crestor   See how symptoms change

## 2021-04-28 NOTE — Telephone Encounter (Signed)
**Note De-Identified [name] Obfuscation** Per the pts [facility] message sent today as follows: I wanted is to go back on Zetia and off the statin. 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  The pt is currently taking Rosuvastatin 5 mg daily.  I advised the pt that I would forward a message to Dr Harrington Challenger and her nurse and that we will contact him with her recommendation.

## 2021-04-28 NOTE — Telephone Encounter (Signed)
Please confirm if patient able to apply for patient assistance

## 2021-04-28 NOTE — Telephone Encounter (Signed)
Eliquis 5mg  refill request received. Patient is 70 years old, weight-129.3kg, Crea-0.91 on 12/31/2020, Diagnosis-DVT, and last seen by Dr. Harrington Challenger on 12/26/2020 via Video Visit. Dose is appropriate based on dosing criteria. Will send in refill to requested pharmacy.    Eliquis last on 01/19/2021 for 3 month supply. Please refer to all the telephone notes as pt is out of town and plan to be back in [place] in March. Pt was made aware of options with Eliquis being expensive.

## 2021-04-29 NOTE — Addendum Note (Signed)
Addended by: Rodman Key on: 04/29/2021 04:23 PM   Modules accepted: Orders

## 2021-04-29 NOTE — Telephone Encounter (Signed)
I know he has the myositis GO ahead and add Zetia 10 Will see when he returns to [facility]

## 2021-04-29 NOTE — Telephone Encounter (Signed)
Have pt stop statin and see how his muscles feel  Write back in 3 to 4 wks

## 2021-04-30 MED ORDER — EZETIMIBE 10 MG PO TABS: 10.0000 mg | ORAL_TABLET | Freq: Every day | ORAL | 3 refills | Status: DC

## 2021-04-30 NOTE — Addendum Note (Signed)
Addended by: Stephani Police on: 04/30/2021 03:58 PM   Modules accepted: Orders

## 2021-05-01 DIAGNOSIS — Z79899 Other long term (current) drug therapy: Secondary | ICD-10-CM | POA: Diagnosis not present

## 2021-05-01 DIAGNOSIS — I10 Essential (primary) hypertension: Secondary | ICD-10-CM | POA: Diagnosis not present

## 2021-05-01 DIAGNOSIS — I82402 Acute embolism and thrombosis of unspecified deep veins of left lower extremity: Secondary | ICD-10-CM | POA: Diagnosis not present

## 2021-05-01 DIAGNOSIS — Z955 Presence of coronary angioplasty implant and graft: Secondary | ICD-10-CM | POA: Diagnosis not present

## 2021-05-01 DIAGNOSIS — L859 Epidermal thickening, unspecified: Secondary | ICD-10-CM | POA: Diagnosis not present

## 2021-05-01 DIAGNOSIS — L97812 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d: Secondary | ICD-10-CM | POA: Diagnosis not present

## 2021-05-01 DIAGNOSIS — I251 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: Secondary | ICD-10-CM | POA: Diagnosis not present

## 2021-05-01 DIAGNOSIS — Z7901 Long term (current) use of anticoagulants: Secondary | ICD-10-CM | POA: Diagnosis not present

## 2021-05-01 DIAGNOSIS — I82502 Chronic embolism and thrombosis of unspecified deep veins of left lower extremity: Secondary | ICD-10-CM | POA: Diagnosis not present

## 2021-05-01 DIAGNOSIS — I739 Peripheral vascular disease, unspecified: Secondary | ICD-10-CM | POA: Diagnosis not present

## 2021-05-01 DIAGNOSIS — E782 Mixed hyperlipidemia: Secondary | ICD-10-CM | POA: Diagnosis not present

## 2021-05-01 DIAGNOSIS — I83018 Varicose veins of right lower extremity with ulcer other part of lower leg: Secondary | ICD-10-CM | POA: Diagnosis not present

## 2021-05-01 DIAGNOSIS — I872 Venous insufficiency (chronic) (peripheral): Secondary | ICD-10-CM | POA: Diagnosis not present

## 2021-05-01 DIAGNOSIS — I890 Lymphedema, not elsewhere classified: Secondary | ICD-10-CM | POA: Diagnosis not present

## 2021-05-05 DIAGNOSIS — I890 Lymphedema, not elsewhere classified: Secondary | ICD-10-CM | POA: Diagnosis not present

## 2021-05-05 DIAGNOSIS — I83018 Varicose veins of right lower extremity with ulcer other part of lower leg: Secondary | ICD-10-CM | POA: Diagnosis not present

## 2021-05-05 DIAGNOSIS — L97812 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d: Secondary | ICD-10-CM | POA: Diagnosis not present

## 2021-05-05 DIAGNOSIS — I872 Venous insufficiency (chronic) (peripheral): Secondary | ICD-10-CM | POA: Diagnosis not present

## 2021-05-05 DIAGNOSIS — L859 Epidermal thickening, unspecified: Secondary | ICD-10-CM | POA: Diagnosis not present

## 2021-05-05 DIAGNOSIS — I739 Peripheral vascular disease, unspecified: Secondary | ICD-10-CM | POA: Diagnosis not present

## 2021-05-06 ENCOUNTER — Telehealth: Payer: Self-pay | Admitting: *Deleted

## 2021-05-06 NOTE — Telephone Encounter (Signed)
See message below.  I contacted his internist's office in [place], Dr. Phill Myron.  Explained that he was not on blood thinner prior to the dx of DVT in June 2022.  Adv that as this is not a cardiac indication, and the patient is not able to be seen in person due to his location, that it would be better for Dr. Phill Myron to manage this.  [place] at Dr. Cindie Crumbly office will pass along this information and they will call the patient after reviewed by Dr. Phill Myron.  I have called the patient to explain.  No answer.  VM box not set up. Updated him with MyChart message.     [name], MD  Rodman Key, RN The physician there should address this.  He is not in the state yet.   I havent seen          Previous Messages   ----- Message -----  From: Rodman Key, RN  Sent: 04/29/2021  11:52 AM EDT  To: [name], MD, Rodman Key, RN  Subject: [name],  So he was dx w DVT in [place] in June (01/02/21).  Started Elqiuis.  Now has asked that you be the prescriber.   I do not know how long he is to remain on Eliquis for DVT and I do not want it to go on indefinitely if not needed.  Can you let me know.  Thank you, M

## 2021-05-08 DIAGNOSIS — I739 Peripheral vascular disease, unspecified: Secondary | ICD-10-CM | POA: Diagnosis not present

## 2021-05-08 DIAGNOSIS — I872 Venous insufficiency (chronic) (peripheral): Secondary | ICD-10-CM | POA: Diagnosis not present

## 2021-05-08 DIAGNOSIS — L97812 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d: Secondary | ICD-10-CM | POA: Diagnosis not present

## 2021-05-08 DIAGNOSIS — I890 Lymphedema, not elsewhere classified: Secondary | ICD-10-CM | POA: Diagnosis not present

## 2021-05-08 DIAGNOSIS — L859 Epidermal thickening, unspecified: Secondary | ICD-10-CM | POA: Diagnosis not present

## 2021-05-08 DIAGNOSIS — I83018 Varicose veins of right lower extremity with ulcer other part of lower leg: Secondary | ICD-10-CM | POA: Diagnosis not present

## 2021-05-08 DIAGNOSIS — I82402 Acute embolism and thrombosis of unspecified deep veins of left lower extremity: Secondary | ICD-10-CM | POA: Diagnosis not present

## 2021-05-13 NOTE — Telephone Encounter (Addendum)
I called the pt to touch base with him re: his DVT... he says he is still on the Eliquis and has enough through December... he says he had a scan last month and it still showed evidence of a DVT (original 12/2020)...   He has been being treated and followed by the wound care center that is taking care of an ulcer in his right ankle... the DVT in his left leg.   I advised him to stay on the Eliquis.. he is okay with paying for it for now.   He is seeing the wound care MD in [place] this Friday and will have them fax [place] his records for Dr. Harrington Challenger to review.   I will My Chart our information  to him. (Fax)   Pt is moving back here in Feb or March... I will go ahead and make him an appt so we have a spot for him with Dr. Harrington Challenger when he returns.

## 2021-05-15 DIAGNOSIS — I82402 Acute embolism and thrombosis of unspecified deep veins of left lower extremity: Secondary | ICD-10-CM | POA: Diagnosis not present

## 2021-05-15 DIAGNOSIS — I890 Lymphedema, not elsewhere classified: Secondary | ICD-10-CM | POA: Diagnosis not present

## 2021-05-15 DIAGNOSIS — L97812 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d: Secondary | ICD-10-CM | POA: Diagnosis not present

## 2021-05-15 DIAGNOSIS — L859 Epidermal thickening, unspecified: Secondary | ICD-10-CM | POA: Diagnosis not present

## 2021-05-15 DIAGNOSIS — I872 Venous insufficiency (chronic) (peripheral): Secondary | ICD-10-CM | POA: Diagnosis not present

## 2021-05-15 DIAGNOSIS — I83018 Varicose veins of right lower extremity with ulcer other part of lower leg: Secondary | ICD-10-CM | POA: Diagnosis not present

## 2021-05-15 DIAGNOSIS — I739 Peripheral vascular disease, unspecified: Secondary | ICD-10-CM | POA: Diagnosis not present

## 2021-05-18 NOTE — Telephone Encounter (Signed)
I spoke with the pt and advised him that we received the records/ doppler reports from [place] and I will leave for Dr. Harrington Challenger to review.   He is still on Eliquis and there are no plans for a repeat doppler at this point where he is.   He is going tomorrow to have Velcro compression stockings made and he is getting a "massage" machine that will help increasing his circulation per his wound MD.   He is concerned about having the machine massage with the DVT but was told there that it was safe.   Will have the records scanned into Epic.

## 2021-05-22 DIAGNOSIS — I890 Lymphedema, not elsewhere classified: Secondary | ICD-10-CM | POA: Diagnosis not present

## 2021-05-22 DIAGNOSIS — I82402 Acute embolism and thrombosis of unspecified deep veins of left lower extremity: Secondary | ICD-10-CM | POA: Diagnosis not present

## 2021-05-22 DIAGNOSIS — I83018 Varicose veins of right lower extremity with ulcer other part of lower leg: Secondary | ICD-10-CM | POA: Diagnosis not present

## 2021-05-22 DIAGNOSIS — L97812 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d: Secondary | ICD-10-CM | POA: Diagnosis not present

## 2021-05-22 DIAGNOSIS — L859 Epidermal thickening, unspecified: Secondary | ICD-10-CM | POA: Diagnosis not present

## 2021-05-22 DIAGNOSIS — I739 Peripheral vascular disease, unspecified: Secondary | ICD-10-CM | POA: Diagnosis not present

## 2021-05-22 DIAGNOSIS — I872 Venous insufficiency (chronic) (peripheral): Secondary | ICD-10-CM | POA: Diagnosis not present

## 2021-05-28 NOTE — Telephone Encounter (Signed)
He has been on blood thinner since September   The clot in his leg should be dissolving   Keep on Eliquis AT lEAST for 6 months, maybe longer

## 2021-05-29 DIAGNOSIS — Z79899 Other long term (current) drug therapy: Secondary | ICD-10-CM | POA: Diagnosis not present

## 2021-05-29 DIAGNOSIS — I82402 Acute embolism and thrombosis of unspecified deep veins of left lower extremity: Secondary | ICD-10-CM | POA: Diagnosis not present

## 2021-05-29 DIAGNOSIS — Z7901 Long term (current) use of anticoagulants: Secondary | ICD-10-CM | POA: Diagnosis not present

## 2021-05-29 DIAGNOSIS — L97812 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d: Secondary | ICD-10-CM | POA: Diagnosis not present

## 2021-05-29 DIAGNOSIS — I251 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: Secondary | ICD-10-CM | POA: Diagnosis not present

## 2021-05-29 DIAGNOSIS — E782 Mixed hyperlipidemia: Secondary | ICD-10-CM | POA: Diagnosis not present

## 2021-05-29 DIAGNOSIS — I890 Lymphedema, not elsewhere classified: Secondary | ICD-10-CM | POA: Diagnosis not present

## 2021-05-29 DIAGNOSIS — I872 Venous insufficiency (chronic) (peripheral): Secondary | ICD-10-CM | POA: Diagnosis not present

## 2021-05-29 DIAGNOSIS — I10 Essential (primary) hypertension: Secondary | ICD-10-CM | POA: Diagnosis not present

## 2021-05-29 NOTE — Telephone Encounter (Signed)
Called and spoke with the pt and he will call and let us know when he plans to return home here and I will make him an appt with Dr. Harrington Challenger... he will stay on his Eliquis.

## 2021-06-02 DIAGNOSIS — I872 Venous insufficiency (chronic) (peripheral): Secondary | ICD-10-CM | POA: Diagnosis not present

## 2021-06-02 DIAGNOSIS — L97812 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d: Secondary | ICD-10-CM | POA: Diagnosis not present

## 2021-06-02 DIAGNOSIS — I10 Essential (primary) hypertension: Secondary | ICD-10-CM | POA: Diagnosis not present

## 2021-06-02 DIAGNOSIS — E782 Mixed hyperlipidemia: Secondary | ICD-10-CM | POA: Diagnosis not present

## 2021-06-02 DIAGNOSIS — I251 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: Secondary | ICD-10-CM | POA: Diagnosis not present

## 2021-06-02 DIAGNOSIS — I890 Lymphedema, not elsewhere classified: Secondary | ICD-10-CM | POA: Diagnosis not present

## 2021-06-05 DIAGNOSIS — I10 Essential (primary) hypertension: Secondary | ICD-10-CM | POA: Diagnosis not present

## 2021-06-05 DIAGNOSIS — L97812 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d: Secondary | ICD-10-CM | POA: Diagnosis not present

## 2021-06-05 DIAGNOSIS — I872 Venous insufficiency (chronic) (peripheral): Secondary | ICD-10-CM | POA: Diagnosis not present

## 2021-06-05 DIAGNOSIS — E782 Mixed hyperlipidemia: Secondary | ICD-10-CM | POA: Diagnosis not present

## 2021-06-05 DIAGNOSIS — I82402 Acute embolism and thrombosis of unspecified deep veins of left lower extremity: Secondary | ICD-10-CM | POA: Diagnosis not present

## 2021-06-05 DIAGNOSIS — I890 Lymphedema, not elsewhere classified: Secondary | ICD-10-CM | POA: Diagnosis not present

## 2021-06-05 DIAGNOSIS — I251 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: Secondary | ICD-10-CM | POA: Diagnosis not present

## 2021-06-12 DIAGNOSIS — I10 Essential (primary) hypertension: Secondary | ICD-10-CM | POA: Diagnosis not present

## 2021-06-12 DIAGNOSIS — E782 Mixed hyperlipidemia: Secondary | ICD-10-CM | POA: Diagnosis not present

## 2021-06-12 DIAGNOSIS — I82402 Acute embolism and thrombosis of unspecified deep veins of left lower extremity: Secondary | ICD-10-CM | POA: Diagnosis not present

## 2021-06-12 DIAGNOSIS — I872 Venous insufficiency (chronic) (peripheral): Secondary | ICD-10-CM | POA: Diagnosis not present

## 2021-06-12 DIAGNOSIS — L97812 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d: Secondary | ICD-10-CM | POA: Diagnosis not present

## 2021-06-12 DIAGNOSIS — I890 Lymphedema, not elsewhere classified: Secondary | ICD-10-CM | POA: Diagnosis not present

## 2021-06-12 DIAGNOSIS — I251 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: Secondary | ICD-10-CM | POA: Diagnosis not present

## 2021-06-19 DIAGNOSIS — I10 Essential (primary) hypertension: Secondary | ICD-10-CM | POA: Diagnosis not present

## 2021-06-19 DIAGNOSIS — E782 Mixed hyperlipidemia: Secondary | ICD-10-CM | POA: Diagnosis not present

## 2021-06-19 DIAGNOSIS — L97812 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d: Secondary | ICD-10-CM | POA: Diagnosis not present

## 2021-06-19 DIAGNOSIS — I251 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: Secondary | ICD-10-CM | POA: Diagnosis not present

## 2021-06-19 DIAGNOSIS — I890 Lymphedema, not elsewhere classified: Secondary | ICD-10-CM | POA: Diagnosis not present

## 2021-06-19 DIAGNOSIS — I872 Venous insufficiency (chronic) (peripheral): Secondary | ICD-10-CM | POA: Diagnosis not present

## 2021-06-24 ENCOUNTER — Telehealth: Payer: Self-pay | Admitting: *Deleted

## 2021-06-24 NOTE — Telephone Encounter (Signed)
I called the pt and confirmed his DOB. I explained that [place] has his DOB as [birth year], and that we have his DOB as 05-14-2051. Per the pt he stated his correct DOB is 05-14-2051. I assured the pt that I will now get the clearance request over to the pre op provider for review. Pt states he wanted to let doctor know that he is doing well on his blood thinner and that his leg looks good.    Pre-operative Risk Assessment    Patient Name: [name]  DOB: [birth year] MRN: [id number]     Request for Surgical Clearance   Procedure:  Dental Extraction - No. of Teeth:  PT WILL BE HAVING 6 TEETH EXTRACTED   Date of Surgery: Clearance 07/04/21                                 Surgeon:  NOT LISTED Surgeon's Group or Practice Name:  [facility] Phone number:  [phone] Fax number:  [phone]   Type of Clearance Requested: - Medical  - Pharmacy:  Hold Aspirin and Apixaban (Eliquis)     Type of Anesthesia:   Local    Additional requests/questions:   [name]   06/24/2021, 11:01 AM

## 2021-06-24 NOTE — Telephone Encounter (Signed)
Pt on Eliquis for DVT diagnosed 01/02/21. Dental work scheduled 6 months from DVT dx. With 6 dental extractions, likely would need to hold Eliquis for 1-2 days prior. Will forward to MD to confirm if anticoag hold is acceptable with DVT dx 5-6 months ago.

## 2021-06-24 NOTE — Telephone Encounter (Signed)
Thank you Dr. Harrington Challenger. Sorry, just want to clarify. So it is OK for patient to go ahead and hold Eliquis for 1-2 days for multiple dental extractions on 07/04/2021? Or do want to wait until you are able to review outside records?  Thanks so much! Leiyah Maultsby

## 2021-06-24 NOTE — Telephone Encounter (Signed)
Pharmacy, can you please comment on how long Eliquis can be held for multiple teeth extractions? Looks like he was diagnosed with a DVT in 03/2021 but we have been prescribing his Eliquis. Given recent DVT, when would it be OK for him to hold his Eliquis?  Thank you!

## 2021-06-26 DIAGNOSIS — I890 Lymphedema, not elsewhere classified: Secondary | ICD-10-CM | POA: Diagnosis not present

## 2021-06-26 DIAGNOSIS — Z955 Presence of coronary angioplasty implant and graft: Secondary | ICD-10-CM | POA: Diagnosis not present

## 2021-06-26 DIAGNOSIS — I872 Venous insufficiency (chronic) (peripheral): Secondary | ICD-10-CM | POA: Diagnosis not present

## 2021-06-26 DIAGNOSIS — I251 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: Secondary | ICD-10-CM | POA: Diagnosis not present

## 2021-06-26 DIAGNOSIS — Z981 Arthrodesis status: Secondary | ICD-10-CM | POA: Diagnosis not present

## 2021-06-26 DIAGNOSIS — Z7901 Long term (current) use of anticoagulants: Secondary | ICD-10-CM | POA: Diagnosis not present

## 2021-06-26 DIAGNOSIS — E782 Mixed hyperlipidemia: Secondary | ICD-10-CM | POA: Diagnosis not present

## 2021-06-26 DIAGNOSIS — I82402 Acute embolism and thrombosis of unspecified deep veins of left lower extremity: Secondary | ICD-10-CM | POA: Diagnosis not present

## 2021-06-26 DIAGNOSIS — E669 Obesity, unspecified: Secondary | ICD-10-CM | POA: Diagnosis not present

## 2021-06-26 DIAGNOSIS — I10 Essential (primary) hypertension: Secondary | ICD-10-CM | POA: Diagnosis not present

## 2021-06-26 DIAGNOSIS — Z79899 Other long term (current) drug therapy: Secondary | ICD-10-CM | POA: Diagnosis not present

## 2021-06-26 DIAGNOSIS — L97812 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d: Secondary | ICD-10-CM | POA: Diagnosis not present

## 2021-06-29 ENCOUNTER — Telehealth: Payer: Self-pay

## 2021-06-29 DIAGNOSIS — I82499 Acute embolism and thrombosis of other specified deep vein of unspecified lower extremity: Secondary | ICD-10-CM

## 2021-06-29 NOTE — Telephone Encounter (Signed)
Dr. Harrington Challenger, we read your message that you needed to review chart first before we held anticoagulation.  Is that correct?  Procedure 07/04/21

## 2021-06-29 NOTE — Telephone Encounter (Signed)
Pre op provider sent message to Dr. Harrington Challenger in regard to clearance, see notes from Cecilie Kicks, NP.

## 2021-06-29 NOTE — Telephone Encounter (Signed)
Ok per pre op provider today Cecilie Kicks, NP to fax clearance notes to dental office. Clearance notes faxed to [facility] fax# [phone].   In regard the appt's that Dr. Harrington Challenger is suggesting for in March with her and Hematology, I will send her nurse Lelon Frohlich a message to set those appts up as they do not pertain to the need for pre op clearance.

## 2021-06-29 NOTE — Telephone Encounter (Signed)
Pt to have complex dental extraction/replacements The pt had DVT   Etiology not clear.  He has been on anticoagulation for 6 months  Given that cause unknown he is at prob small but increased risk for repeat clot formation Discussed with pt  OK to hold Eliquis for 3 dose prior to dental work,   Resume as soon as safely possible after   The pt will be returning to [facility] in early March.   Make appt with me mid/ tate march  Also make appt with hematology for late march  Dx   DVT unknown cause

## 2021-06-29 NOTE — Telephone Encounter (Signed)
-----   Message from Michae Kava, [place] sent at 06/29/2021  1:47 PM EST ----- Regarding: appts needed per Dr. Ardath Sax,  Dr. Harrington Challenger had this message in the clearance notes as well. I am just sending the part of the appts not related to pre op.  Thank you Carol   PER DR. ROSS: The pt will be returning to [facility] in early March. Make appt with me mid/ late march  Also make appt with hematology for late march  Dx   DVT unknown cause

## 2021-06-29 NOTE — Telephone Encounter (Signed)
Pt has appt with Dr. Harrington Challenger 10/13/21 and will place referral for Hematology appt for March appt.

## 2021-07-01 ENCOUNTER — Encounter: Payer: Self-pay | Admitting: Internal Medicine

## 2021-07-03 DIAGNOSIS — I872 Venous insufficiency (chronic) (peripheral): Secondary | ICD-10-CM | POA: Diagnosis not present

## 2021-07-03 DIAGNOSIS — I890 Lymphedema, not elsewhere classified: Secondary | ICD-10-CM | POA: Diagnosis not present

## 2021-07-03 DIAGNOSIS — E782 Mixed hyperlipidemia: Secondary | ICD-10-CM | POA: Diagnosis not present

## 2021-07-03 DIAGNOSIS — I82402 Acute embolism and thrombosis of unspecified deep veins of left lower extremity: Secondary | ICD-10-CM | POA: Diagnosis not present

## 2021-07-03 DIAGNOSIS — E669 Obesity, unspecified: Secondary | ICD-10-CM | POA: Diagnosis not present

## 2021-07-03 DIAGNOSIS — I10 Essential (primary) hypertension: Secondary | ICD-10-CM | POA: Diagnosis not present

## 2021-07-03 DIAGNOSIS — L97812 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d: Secondary | ICD-10-CM | POA: Diagnosis not present

## 2021-07-07 DIAGNOSIS — L97812 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d: Secondary | ICD-10-CM | POA: Diagnosis not present

## 2021-07-07 DIAGNOSIS — I872 Venous insufficiency (chronic) (peripheral): Secondary | ICD-10-CM | POA: Diagnosis not present

## 2021-07-07 DIAGNOSIS — E782 Mixed hyperlipidemia: Secondary | ICD-10-CM | POA: Diagnosis not present

## 2021-07-07 DIAGNOSIS — E669 Obesity, unspecified: Secondary | ICD-10-CM | POA: Diagnosis not present

## 2021-07-07 DIAGNOSIS — I10 Essential (primary) hypertension: Secondary | ICD-10-CM | POA: Diagnosis not present

## 2021-07-07 DIAGNOSIS — I890 Lymphedema, not elsewhere classified: Secondary | ICD-10-CM | POA: Diagnosis not present

## 2021-07-10 DIAGNOSIS — I872 Venous insufficiency (chronic) (peripheral): Secondary | ICD-10-CM | POA: Diagnosis not present

## 2021-07-10 DIAGNOSIS — L97812 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d: Secondary | ICD-10-CM | POA: Diagnosis not present

## 2021-07-10 DIAGNOSIS — I890 Lymphedema, not elsewhere classified: Secondary | ICD-10-CM | POA: Diagnosis not present

## 2021-07-10 DIAGNOSIS — I10 Essential (primary) hypertension: Secondary | ICD-10-CM | POA: Diagnosis not present

## 2021-07-10 DIAGNOSIS — E782 Mixed hyperlipidemia: Secondary | ICD-10-CM | POA: Diagnosis not present

## 2021-07-10 DIAGNOSIS — E669 Obesity, unspecified: Secondary | ICD-10-CM | POA: Diagnosis not present

## 2021-07-10 DIAGNOSIS — I82402 Acute embolism and thrombosis of unspecified deep veins of left lower extremity: Secondary | ICD-10-CM | POA: Diagnosis not present

## 2021-07-17 DIAGNOSIS — I890 Lymphedema, not elsewhere classified: Secondary | ICD-10-CM | POA: Diagnosis not present

## 2021-07-17 DIAGNOSIS — E669 Obesity, unspecified: Secondary | ICD-10-CM | POA: Diagnosis not present

## 2021-07-17 DIAGNOSIS — I872 Venous insufficiency (chronic) (peripheral): Secondary | ICD-10-CM | POA: Diagnosis not present

## 2021-07-17 DIAGNOSIS — I82402 Acute embolism and thrombosis of unspecified deep veins of left lower extremity: Secondary | ICD-10-CM | POA: Diagnosis not present

## 2021-07-17 DIAGNOSIS — E782 Mixed hyperlipidemia: Secondary | ICD-10-CM | POA: Diagnosis not present

## 2021-07-17 DIAGNOSIS — L97812 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d: Secondary | ICD-10-CM | POA: Diagnosis not present

## 2021-07-17 DIAGNOSIS — I10 Essential (primary) hypertension: Secondary | ICD-10-CM | POA: Diagnosis not present

## 2021-10-13 ENCOUNTER — Ambulatory Visit: Payer: Medicare Other | Admitting: Internal Medicine

## 2021-10-21 ENCOUNTER — Encounter: Payer: Self-pay | Admitting: Internal Medicine

## 2021-10-31 ENCOUNTER — Other Ambulatory Visit: Payer: Self-pay | Admitting: Internal Medicine

## 2021-10-31 DIAGNOSIS — I82409 Acute embolism and thrombosis of unspecified deep veins of unspecified lower extremity: Secondary | ICD-10-CM

## 2021-11-02 ENCOUNTER — Encounter: Payer: Self-pay | Admitting: Internal Medicine

## 2021-11-02 NOTE — Telephone Encounter (Signed)
Eliquis 5 mg refill request received. Patient is 71 years old, weight- 129.3 kg, Crea- 1.4 on 04/06/21, Diagnosis- DVT, and last seen by Dr. Harrington Challenger on 12/26/20. Dose is appropriate based on dosing criteria. Will send in refill to requested pharmacy.   ?

## 2021-11-02 NOTE — Telephone Encounter (Signed)
Called and spoke directly to patient who states that he has not actually went to [facility] or called the pharmacy to see why the price hiked. Pt states he will do that today, but felt that it was high already, and certainly is not willing to pay this much for them. He wants Dr Harrington Challenger to address if he still needs to be on this medication at all, and also states he will let us know the outcome from the pharmacy for why the cost is so high. ?

## 2021-12-02 DIAGNOSIS — Z7901 Long term (current) use of anticoagulants: Secondary | ICD-10-CM | POA: Diagnosis not present

## 2021-12-02 DIAGNOSIS — I890 Lymphedema, not elsewhere classified: Secondary | ICD-10-CM | POA: Diagnosis not present

## 2021-12-02 DIAGNOSIS — E782 Mixed hyperlipidemia: Secondary | ICD-10-CM | POA: Diagnosis not present

## 2021-12-02 DIAGNOSIS — L97812 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d: Secondary | ICD-10-CM | POA: Diagnosis not present

## 2021-12-02 DIAGNOSIS — Z79899 Other long term (current) drug therapy: Secondary | ICD-10-CM | POA: Diagnosis not present

## 2021-12-02 DIAGNOSIS — I82402 Acute embolism and thrombosis of unspecified deep veins of left lower extremity: Secondary | ICD-10-CM | POA: Diagnosis not present

## 2021-12-02 DIAGNOSIS — I872 Venous insufficiency (chronic) (peripheral): Secondary | ICD-10-CM | POA: Diagnosis not present

## 2021-12-02 DIAGNOSIS — L97512 Non-pressure chronic ulcer of other part of right foot with fat layer exposed: Secondary | ICD-10-CM | POA: Diagnosis not present

## 2021-12-02 DIAGNOSIS — Z955 Presence of coronary angioplasty implant and graft: Secondary | ICD-10-CM | POA: Diagnosis not present

## 2021-12-02 DIAGNOSIS — Z980 Intestinal bypass and anastomosis status: Secondary | ICD-10-CM | POA: Diagnosis not present

## 2021-12-02 DIAGNOSIS — I10 Essential (primary) hypertension: Secondary | ICD-10-CM | POA: Diagnosis not present

## 2021-12-02 DIAGNOSIS — I251 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: Secondary | ICD-10-CM | POA: Diagnosis not present

## 2021-12-02 DIAGNOSIS — S81801A Unspecified open wound, right lower leg, initial encounter: Secondary | ICD-10-CM | POA: Diagnosis not present

## 2021-12-09 DIAGNOSIS — E782 Mixed hyperlipidemia: Secondary | ICD-10-CM | POA: Diagnosis not present

## 2021-12-09 DIAGNOSIS — I872 Venous insufficiency (chronic) (peripheral): Secondary | ICD-10-CM | POA: Diagnosis not present

## 2021-12-09 DIAGNOSIS — I82402 Acute embolism and thrombosis of unspecified deep veins of left lower extremity: Secondary | ICD-10-CM | POA: Diagnosis not present

## 2021-12-09 DIAGNOSIS — I890 Lymphedema, not elsewhere classified: Secondary | ICD-10-CM | POA: Diagnosis not present

## 2021-12-09 DIAGNOSIS — L97812 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d: Secondary | ICD-10-CM | POA: Diagnosis not present

## 2021-12-09 DIAGNOSIS — L97512 Non-pressure chronic ulcer of other part of right foot with fat layer exposed: Secondary | ICD-10-CM | POA: Diagnosis not present

## 2021-12-09 DIAGNOSIS — S81801A Unspecified open wound, right lower leg, initial encounter: Secondary | ICD-10-CM | POA: Diagnosis not present

## 2021-12-11 DIAGNOSIS — I872 Venous insufficiency (chronic) (peripheral): Secondary | ICD-10-CM | POA: Diagnosis not present

## 2021-12-11 DIAGNOSIS — S81801A Unspecified open wound, right lower leg, initial encounter: Secondary | ICD-10-CM | POA: Diagnosis not present

## 2021-12-11 DIAGNOSIS — E782 Mixed hyperlipidemia: Secondary | ICD-10-CM | POA: Diagnosis not present

## 2021-12-11 DIAGNOSIS — I890 Lymphedema, not elsewhere classified: Secondary | ICD-10-CM | POA: Diagnosis not present

## 2021-12-11 DIAGNOSIS — L97812 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d: Secondary | ICD-10-CM | POA: Diagnosis not present

## 2021-12-11 DIAGNOSIS — L97512 Non-pressure chronic ulcer of other part of right foot with fat layer exposed: Secondary | ICD-10-CM | POA: Diagnosis not present

## 2021-12-16 DIAGNOSIS — S81801A Unspecified open wound, right lower leg, initial encounter: Secondary | ICD-10-CM | POA: Diagnosis not present

## 2021-12-16 DIAGNOSIS — I890 Lymphedema, not elsewhere classified: Secondary | ICD-10-CM | POA: Diagnosis not present

## 2021-12-16 DIAGNOSIS — E782 Mixed hyperlipidemia: Secondary | ICD-10-CM | POA: Diagnosis not present

## 2021-12-16 DIAGNOSIS — L97512 Non-pressure chronic ulcer of other part of right foot with fat layer exposed: Secondary | ICD-10-CM | POA: Diagnosis not present

## 2021-12-16 DIAGNOSIS — I872 Venous insufficiency (chronic) (peripheral): Secondary | ICD-10-CM | POA: Diagnosis not present

## 2021-12-16 DIAGNOSIS — I82402 Acute embolism and thrombosis of unspecified deep veins of left lower extremity: Secondary | ICD-10-CM | POA: Diagnosis not present

## 2021-12-16 DIAGNOSIS — L97812 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d: Secondary | ICD-10-CM | POA: Diagnosis not present

## 2021-12-22 ENCOUNTER — Other Ambulatory Visit: Payer: Self-pay | Admitting: Internal Medicine

## 2021-12-22 DIAGNOSIS — I10 Essential (primary) hypertension: Secondary | ICD-10-CM

## 2021-12-22 DIAGNOSIS — G729 Myopathy, unspecified: Secondary | ICD-10-CM

## 2021-12-22 DIAGNOSIS — E782 Mixed hyperlipidemia: Secondary | ICD-10-CM

## 2021-12-23 DIAGNOSIS — S81801A Unspecified open wound, right lower leg, initial encounter: Secondary | ICD-10-CM | POA: Diagnosis not present

## 2021-12-23 DIAGNOSIS — L97812 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d: Secondary | ICD-10-CM | POA: Diagnosis not present

## 2021-12-23 DIAGNOSIS — E782 Mixed hyperlipidemia: Secondary | ICD-10-CM | POA: Diagnosis not present

## 2021-12-23 DIAGNOSIS — I872 Venous insufficiency (chronic) (peripheral): Secondary | ICD-10-CM | POA: Diagnosis not present

## 2021-12-23 DIAGNOSIS — I82402 Acute embolism and thrombosis of unspecified deep veins of left lower extremity: Secondary | ICD-10-CM | POA: Diagnosis not present

## 2021-12-23 DIAGNOSIS — I890 Lymphedema, not elsewhere classified: Secondary | ICD-10-CM | POA: Diagnosis not present

## 2021-12-23 DIAGNOSIS — L97512 Non-pressure chronic ulcer of other part of right foot with fat layer exposed: Secondary | ICD-10-CM | POA: Diagnosis not present

## 2021-12-30 DIAGNOSIS — L02413 Cutaneous abscess of right upper limb: Secondary | ICD-10-CM | POA: Diagnosis not present

## 2021-12-30 DIAGNOSIS — L97512 Non-pressure chronic ulcer of other part of right foot with fat layer exposed: Secondary | ICD-10-CM | POA: Diagnosis not present

## 2021-12-30 DIAGNOSIS — Z955 Presence of coronary angioplasty implant and graft: Secondary | ICD-10-CM | POA: Diagnosis not present

## 2021-12-30 DIAGNOSIS — I251 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: Secondary | ICD-10-CM | POA: Diagnosis not present

## 2021-12-30 DIAGNOSIS — Z79899 Other long term (current) drug therapy: Secondary | ICD-10-CM | POA: Diagnosis not present

## 2021-12-30 DIAGNOSIS — L97522 Non-pressure chronic ulcer of other part of left foot with fat layer exposed: Secondary | ICD-10-CM | POA: Diagnosis not present

## 2021-12-30 DIAGNOSIS — I82402 Acute embolism and thrombosis of unspecified deep veins of left lower extremity: Secondary | ICD-10-CM | POA: Diagnosis not present

## 2021-12-30 DIAGNOSIS — I83015 Varicose veins of right lower extremity with ulcer other part of foot: Secondary | ICD-10-CM | POA: Diagnosis not present

## 2021-12-30 DIAGNOSIS — I872 Venous insufficiency (chronic) (peripheral): Secondary | ICD-10-CM | POA: Diagnosis not present

## 2021-12-30 DIAGNOSIS — S51002A Unspecified open wound of left elbow, initial encounter: Secondary | ICD-10-CM | POA: Diagnosis not present

## 2021-12-30 DIAGNOSIS — I10 Essential (primary) hypertension: Secondary | ICD-10-CM | POA: Diagnosis not present

## 2021-12-30 DIAGNOSIS — L97812 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d: Secondary | ICD-10-CM | POA: Diagnosis not present

## 2021-12-30 DIAGNOSIS — L97811 Non-pressure chronic ulcer of other part of right lower leg limited to breakdown of skin: Secondary | ICD-10-CM | POA: Diagnosis not present

## 2021-12-30 DIAGNOSIS — E782 Mixed hyperlipidemia: Secondary | ICD-10-CM | POA: Diagnosis not present

## 2021-12-30 DIAGNOSIS — I890 Lymphedema, not elsewhere classified: Secondary | ICD-10-CM | POA: Diagnosis not present

## 2021-12-30 DIAGNOSIS — L03113 Cellulitis of right upper limb: Secondary | ICD-10-CM | POA: Diagnosis not present

## 2022-01-06 DIAGNOSIS — L97812 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d: Secondary | ICD-10-CM | POA: Diagnosis not present

## 2022-01-06 DIAGNOSIS — I82402 Acute embolism and thrombosis of unspecified deep veins of left lower extremity: Secondary | ICD-10-CM | POA: Diagnosis not present

## 2022-01-06 DIAGNOSIS — L97811 Non-pressure chronic ulcer of other part of right lower leg limited to breakdown of skin: Secondary | ICD-10-CM | POA: Diagnosis not present

## 2022-01-06 DIAGNOSIS — I872 Venous insufficiency (chronic) (peripheral): Secondary | ICD-10-CM | POA: Diagnosis not present

## 2022-01-06 DIAGNOSIS — I83015 Varicose veins of right lower extremity with ulcer other part of foot: Secondary | ICD-10-CM | POA: Diagnosis not present

## 2022-01-06 DIAGNOSIS — L02413 Cutaneous abscess of right upper limb: Secondary | ICD-10-CM | POA: Diagnosis not present

## 2022-01-06 DIAGNOSIS — L97522 Non-pressure chronic ulcer of other part of left foot with fat layer exposed: Secondary | ICD-10-CM | POA: Diagnosis not present

## 2022-01-06 DIAGNOSIS — L97512 Non-pressure chronic ulcer of other part of right foot with fat layer exposed: Secondary | ICD-10-CM | POA: Diagnosis not present

## 2022-01-06 DIAGNOSIS — L03113 Cellulitis of right upper limb: Secondary | ICD-10-CM | POA: Diagnosis not present

## 2022-01-13 DIAGNOSIS — L97512 Non-pressure chronic ulcer of other part of right foot with fat layer exposed: Secondary | ICD-10-CM | POA: Diagnosis not present

## 2022-01-13 DIAGNOSIS — L02413 Cutaneous abscess of right upper limb: Secondary | ICD-10-CM | POA: Diagnosis not present

## 2022-01-13 DIAGNOSIS — L03113 Cellulitis of right upper limb: Secondary | ICD-10-CM | POA: Diagnosis not present

## 2022-01-13 DIAGNOSIS — L97812 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d: Secondary | ICD-10-CM | POA: Diagnosis not present

## 2022-01-13 DIAGNOSIS — L97811 Non-pressure chronic ulcer of other part of right lower leg limited to breakdown of skin: Secondary | ICD-10-CM | POA: Diagnosis not present

## 2022-01-13 DIAGNOSIS — I82402 Acute embolism and thrombosis of unspecified deep veins of left lower extremity: Secondary | ICD-10-CM | POA: Diagnosis not present

## 2022-01-13 DIAGNOSIS — I83015 Varicose veins of right lower extremity with ulcer other part of foot: Secondary | ICD-10-CM | POA: Diagnosis not present

## 2022-01-13 DIAGNOSIS — I872 Venous insufficiency (chronic) (peripheral): Secondary | ICD-10-CM | POA: Diagnosis not present

## 2022-01-13 DIAGNOSIS — L97522 Non-pressure chronic ulcer of other part of left foot with fat layer exposed: Secondary | ICD-10-CM | POA: Diagnosis not present

## 2022-01-14 ENCOUNTER — Other Ambulatory Visit: Payer: Self-pay | Admitting: Internal Medicine

## 2022-01-18 ENCOUNTER — Ambulatory Visit: Payer: Medicare Other | Admitting: Internal Medicine

## 2022-01-28 NOTE — Progress Notes (Signed)
Office Visit    Patient Name: [name] Date of Encounter: 01/29/2022  PCP:  Patient, No Pcp Per   [facility]  Cardiologist:  Dorris Carnes, MD  Advanced Practice Provider:  No care team member to display Electrophysiologist:  None    Chief Complaint    [name] is a 71 y.o. adult with a hx of CAD (last cardiac catheterization November 2015 showed a widely patent previously placed bare-metal stent in the proximal LAD (1998) without restenosis and patient underwent PCI to subtotal occlusion of RCA with DES stent placement), hypertension, hyperlipidemia, RBBB presents today for overdue follow-up appointment.  He was last seen via video visit with Dr. Harrington Challenger 01/22/2021.  He was not feeling well during this visit.  He had fevers, chills, but no cough.  He took a COVID test x2 both were negative.  He denied chest pain, but noticed some increased SOB.  Also, he notes some increased lower extremity edema.  He had an upcoming orthopedic surgery.  Labs were ordered.  Today, he shares that he has finally moved in to his daughter's house.  She has completed the renovations.  He was living in [place] before this.  He states that he is not doing well today.  He is having increased swelling in both legs as well as some open wounds.  He has had swelling of his right leg since he was in his 104s and his left leg is actually less swollen than it has been since he had a DVT.  He is on Eliquis currently and would like to come off of it due to the cost.  It was noted in previous notes that a hypercoagulable panel was to be ordered to further evaluate the cause of the DVT.  We will order this today.  The patient also requests a consult for wound care for his open ulcers on his legs.  He has some venous insufficiency with ulceration that needs to be addressed.  We will also reach out to vascular surgery for consult.  He has his legs wrapped today.  There is some erythema present and possibly  could be infected.  Not sure what his legs look like at baseline.  Blood pressure is well controlled today 130/70.  We reviewed his EKG with a stable right bundle branch block, normal sinus rhythm with occasional PVC.  Reports no chest pain, pressure, or tightness. No orthopnea, PND. Reports no palpitations.    Past Medical History    Past Medical History:  Diagnosis Date   Adenomatous colon polyp - diminutive 06/2019   no recall needed   Coronary artery disease    a. s/p BMS to LAD (1998) b. s/p DES to RCA (06/24/14)   Dyspnea    HTN (hypertension)    Hyperlipidemia    Myositis    chronic CK elevation ( 415 CK, ALL MM CK subtype, seen by Rheumatology and considered normal Variant)   Palatal fistula    Palpable abd. aorta    Pneumonia    RBBB    Spinal stenosis    Dr Vaughan Sine  surgery   Weakness    Past Surgical History:  Procedure Laterality Date   CERVICAL FUSION  09/2009   CORONARY ANGIOPLASTY     CORONARY STENT PLACEMENT  06/24/2014   DES to RCA   [place]     LEFT HEART CATHETERIZATION WITH CORONARY ANGIOGRAM N/A 06/24/2014  Procedure: LEFT HEART CATHETERIZATION WITH CORONARY ANGIOGRAM;  Surgeon: Troy Sine, MD;  Location: [facility] CATH LAB;  Service: Cardiovascular;  Laterality: N/A;   LUMBAR SPINE SURGERY  5/13   MUSCLE BIOPSY Left 07/11/2018   Procedure: Left Vastus lateralus muscle biopsy;  Surgeon: Judith Part, MD;  Location: [facility];  Service: Neurosurgery;  Laterality: Left;  Left Vastus lateralus muscle biopsy   PERCUTANEOUS CORONARY STENT INTERVENTION (PCI-S)  06/24/2014   Procedure: PERCUTANEOUS CORONARY STENT INTERVENTION (PCI-S);  Surgeon: Troy Sine, MD;  Location: [facility] CATH LAB;  Service: Cardiovascular;;  Distal RCA    Allergies  No Known Allergies  EKGs/Labs/Other Studies Reviewed:   The following studies were reviewed today:  04/13/2019 echocardiogram IMPRESSIONS     1. Left ventricular ejection  fraction, by visual estimation, is 60 to  65%. The left ventricle has normal function. Normal left ventricular size.  Left ventricular septal wall thickness was moderately increased. There is  no left ventricular hypertrophy.   2. Global right ventricle has normal systolic function.The right  ventricular size is normal. No increase in right ventricular wall  thickness.   3. Left atrial size was moderately dilated.   4. Right atrial size was normal.   5. Moderate thickening of the mitral valve leaflet(s).   6. The mitral valve is normal in structure. Mild mitral valve  regurgitation. No evidence of mitral stenosis.   7. The tricuspid valve is normal in structure. Tricuspid valve  regurgitation is mild.   8. The aortic valve is normal in structure. Aortic valve regurgitation is  mild by color flow Doppler. Mild aortic valve sclerosis without stenosis.   9. The pulmonic valve was grossly normal. Pulmonic valve regurgitation is  trivial by color flow Doppler.  10. The inferior vena cava is normal in size with greater than 50%  respiratory variability, suggesting right atrial pressure of 3 mmHg.   EKG:  EKG is ordered today.  The ekg ordered today demonstrates normal sinus rhythm with PVCs, RBBB (chronic)  Recent Labs: No results found for requested labs within last 365 days.  Recent Lipid Panel    Component Value Date/Time   CHOL 116 12/31/2020 0856   TRIG 92 12/31/2020 0856   HDL 28 (L) 12/31/2020 0856   CHOLHDL 4.1 12/31/2020 0856   CHOLHDL 3.3 11/13/2015 1107   VLDL 26 11/13/2015 1107   LDLCALC 70 12/31/2020 0856   LDLDIRECT 65 05/16/2019 1400   Home Medications   Current Meds  Medication Sig   acetaminophen (TYLENOL) 500 MG tablet Take by mouth as needed for moderate pain or mild pain.   apixaban (ELIQUIS) 5 MG TABS tablet Take 1 tablet by mouth twice daily   atenolol (TENORMIN) 25 MG tablet Take 1 tablet by mouth twice daily   b complex vitamins tablet Take 1 tablet by  mouth daily.   betamethasone dipropionate (DIPROLENE) 0.05 % cream Apply 1 application topically every other day.   cholecalciferol (VITAMIN D) 1000 units tablet Take 1,000 Units by mouth daily.   Coenzyme Q10 (CO Q 10) 100 MG CAPS Take 100 mg by mouth daily.    diphenhydrAMINE (BENADRYL) 25 MG tablet Take 50 mg by mouth at bedtime.   ezetimibe (ZETIA) 10 MG tablet Take 1 tablet (10 mg total) by mouth daily.   folic acid (FOLVITE) 161 MCG tablet Take 800 mcg by mouth daily.    furosemide (LASIX) 40 MG tablet TAKE 1 TABLET BY MOUTH FOR 3 DAYS THEN TAKE ONLY AS NEEDED  FOR EDEMA   glucosamine-chondroitin 500-400 MG tablet Take 2 tablets by mouth daily.    ibuprofen (ADVIL,MOTRIN) 800 MG tablet Take 800 mg by mouth daily.    Krill Oil 500 MG CAPS Take 500 mg by mouth daily.    lisinopril (ZESTRIL) 10 MG tablet Take 1 tablet by mouth once daily   Melatonin 10 MG TABS Take 20 mg by mouth at bedtime.   potassium chloride (K-DUR) 10 MEQ tablet TAKE 1 TABLET BY MOUTH FOR 3 DAYS THEN TAKE ONLY AS NEEDED WHEN YOU TAKE A LASIX   rosuvastatin (CRESTOR) 5 MG tablet Take 1 tablet (5 mg total) by mouth daily.   Saw Palmetto, Serenoa repens, (SAW PALMETTO PO) Take 300 mg by mouth daily.   triamcinolone cream (KENALOG) 0.1 % Apply 1 application topically as directed.   [DISCONTINUED] augmented betamethasone dipropionate (DIPROLENE-AF) 0.05 % cream APPLY CREAM TOPICALLY TWICE DAILY TO AFFECTED AREAS   [DISCONTINUED] levOCARNitine (L-CARNITINE) 500 MG TABS Take 1 tablet by mouth daily.     Review of Systems      All other systems reviewed and are otherwise negative except as noted above.  Physical Exam    VS:  BP 130/70   Pulse 66   Ht '5\' 11"'$  (1.803 m)   Wt 284 lb (128.8 kg)   SpO2 94%   BMI 39.61 kg/m  , BMI Body mass index is 39.61 kg/m.  Wt Readings from Last 3 Encounters:  01/29/22 284 lb (128.8 kg)  12/26/20 285 lb (129.3 kg)  02/26/20 275 lb (124.7 kg)     GEN: Well nourished, well  developed, in no acute distress. HEENT: normal. Neck: Supple, no JVD, carotid bruits, or masses. Cardiac: RRR, no murmurs, rubs, or gallops. No clubbing, cyanosis, 2-3+ pitting edema with erythema and weeping wounds.  Radials/PT 2+ and equal bilaterally.  Respiratory:  Respirations regular and unlabored, clear to auscultation bilaterally. GI: Soft, nontender, nondistended. MS: No deformity or atrophy. Skin: Warm and dry, no rash. Neuro:  Strength and sensation are intact. Psych: Normal affect.  Assessment & Plan    Hx of CAD -Status post bare-metal stent to the LAD back in 1998 -Status post DES to RCA in 2015 -No chest pain or new SOB -Continue GDMT: Eliquis 5 mg twice daily, Zetia 10 mg daily, furosemide 40 mg as needed lisinopril 10 mg daily, Crestor 5 mg daily  2. Hypertension -Blood pressure well controlled today -Continue current medication regimen  3. Hyperlipidemia -Continue Crestor 5 mg daily -Last LDL 70 -We will need updated lipid panel next time he is in for a visit  4. RBBB -old  68.  Venous stasis with ulceration -open weeping wounds -We will obtain wound care consult -Will obtain VVS consult -compression in place  6. Hx DVT -Remains on Eliquis without any bleeding -Hypercoagulable panel ordered today     Disposition: Follow up 2 months with Dorris Carnes, MD or APP.  Signed, Elgie Collard, PA-C 01/29/2022, 1:12 PM [facility]

## 2022-01-29 ENCOUNTER — Ambulatory Visit (INDEPENDENT_AMBULATORY_CARE_PROVIDER_SITE_OTHER): Payer: Medicare Other | Admitting: Physician Assistant

## 2022-01-29 ENCOUNTER — Encounter: Payer: Self-pay | Admitting: Physician Assistant

## 2022-01-29 VITALS — BP 130/70 | HR 66 | Ht 71.0 in | Wt 284.0 lb

## 2022-01-29 DIAGNOSIS — I878 Other specified disorders of veins: Secondary | ICD-10-CM

## 2022-01-29 DIAGNOSIS — Z136 Encounter for screening for cardiovascular disorders: Secondary | ICD-10-CM | POA: Diagnosis not present

## 2022-01-29 DIAGNOSIS — I82499 Acute embolism and thrombosis of other specified deep vein of unspecified lower extremity: Secondary | ICD-10-CM

## 2022-01-29 DIAGNOSIS — I83024 Varicose veins of left lower extremity with ulcer of heel and midfoot: Secondary | ICD-10-CM

## 2022-01-29 DIAGNOSIS — R600 Localized edema: Secondary | ICD-10-CM

## 2022-01-29 DIAGNOSIS — I251 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: Secondary | ICD-10-CM | POA: Diagnosis not present

## 2022-01-29 DIAGNOSIS — L97421 Non-pressure chronic ulcer of left heel and midfoot limited to breakdown of skin: Secondary | ICD-10-CM | POA: Diagnosis not present

## 2022-01-29 DIAGNOSIS — Z0000 Encounter for general adult medical examination without abnormal findings: Secondary | ICD-10-CM

## 2022-01-29 MED ORDER — EZETIMIBE 10 MG PO TABS: 10.0000 mg | ORAL_TABLET | Freq: Every day | ORAL | 3 refills | Status: DC

## 2022-01-29 NOTE — Patient Instructions (Addendum)
Medication Instructions:  Your physician recommends that you continue on your current medications as directed. Please refer to the Current Medication list given to you today.  *If you need a refill on your cardiac medications before your next appointment, please call your pharmacy*   Lab Work: Hypercoagulable panel today If you have labs (blood work) drawn today and your tests are completely normal, you will receive your results only by: [place] (if you have MyChart) OR A paper copy in the mail If you have any lab test that is abnormal or we need to change your treatment, we will call you to review the results.   Follow-Up: At [facility], you and your health needs are our priority.  As part of our continuing mission to provide you with exceptional heart care, we have created designated Provider Care Teams.  These Care Teams include your primary Cardiologist (physician) and Advanced Practice Providers (APPs -  Physician Assistants and Nurse Practitioners) who all work together to provide you with the care you need, when you need it.  Your next appointment:   2-3 month(s)  The format for your next appointment:   In Person  Provider:   Dorris Carnes, MD {   Other Instructions You have been referred to the wound clinic You have been referred to vein and vascular    Important Information About [place]

## 2022-02-01 MED ORDER — EZETIMIBE 10 MG PO TABS: 10.0000 mg | ORAL_TABLET | Freq: Every day | ORAL | 3 refills | Status: DC

## 2022-02-01 NOTE — Addendum Note (Signed)
Addended by: Willeen Cass A on: 02/01/2022 10:48 AM   Modules accepted: Orders

## 2022-02-03 ENCOUNTER — Other Ambulatory Visit: Payer: Self-pay | Admitting: *Deleted

## 2022-02-03 DIAGNOSIS — I878 Other specified disorders of veins: Secondary | ICD-10-CM

## 2022-02-04 ENCOUNTER — Telehealth: Payer: Self-pay | Admitting: Physician Assistant

## 2022-02-04 NOTE — Telephone Encounter (Signed)
Scheduled appt per 7/12 referral. Pt is aware of appt date and time. Pt is aware to arrive 15 mins prior to appt time and to bring and updated insurance card. Pt is aware of appt location.   

## 2022-02-05 LAB — PROTEIN C, TOTAL: Protein C Antigen: 75 % (ref 60–150)

## 2022-02-05 LAB — PTT-LA MIX: PTT-LA Mix: 47.2 s — ABNORMAL HIGH (ref 0.0–40.5)

## 2022-02-05 LAB — CARDIOLIPIN ANTIBODIES, IGG, IGM, IGA
Anticardiolipin IgA: <9 APL U/mL (ref 0–11)
Anticardiolipin IgG: <9 GPL U/mL (ref 0–14)
Anticardiolipin IgM: <9 MPL U/mL (ref 0–12)

## 2022-02-05 LAB — FACTOR 5 LEIDEN

## 2022-02-05 LAB — ANTITHROMBIN III: AntiThromb III Func: 126 % (ref 75–135)

## 2022-02-05 LAB — DRVVT CONFIRM: dRVVT Confirm: 0.9 ratio (ref 0.8–1.2)

## 2022-02-05 LAB — PROTHROMBIN GENE MUTATION

## 2022-02-05 LAB — BETA-2-GLYCOPROTEIN I ABS, IGG/M/A
Beta-2 Glyco 1 IgA: <9 GPI IgA units (ref 0–25)
Beta-2 Glyco 1 IgM: <9 GPI IgM units (ref 0–32)
Beta-2 Glyco I IgG: 9 GPI IgG units (ref 0–20)

## 2022-02-05 LAB — PROTEIN C ACTIVITY: Protein C Activity: 100 % (ref 73–180)

## 2022-02-05 LAB — HOMOCYSTEINE: Homocysteine: 12.6 umol/L (ref 0.0–19.2)

## 2022-02-05 LAB — PROTEIN S, TOTAL: Protein S Ag, Total: 111 % (ref 60–150)

## 2022-02-05 LAB — DRVVT MIX: dRVVT Mix: 49.3 s — ABNORMAL HIGH (ref 0.0–40.4)

## 2022-02-05 LAB — LUPUS ANTICOAGULANT PANEL
Dilute Viper Venom Time: 65.4 s — ABNORMAL HIGH (ref 0.0–47.0)
PTT Lupus Anticoagulant: 49.8 s — ABNORMAL HIGH (ref 0.0–43.5)

## 2022-02-05 LAB — PROTEIN S ACTIVITY: Protein S Activity: 97 % (ref 63–140)

## 2022-02-05 LAB — HEXAGONAL PHASE PHOSPHOLIPID: Hexagonal Phase Phospholipid: 6 s (ref 0–11)

## 2022-02-09 ENCOUNTER — Encounter (HOSPITAL_BASED_OUTPATIENT_CLINIC_OR_DEPARTMENT_OTHER): Payer: Medicare Other | Attending: General Surgery | Admitting: General Surgery

## 2022-02-09 DIAGNOSIS — I251 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: Secondary | ICD-10-CM | POA: Diagnosis not present

## 2022-02-09 DIAGNOSIS — L97812 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d: Secondary | ICD-10-CM | POA: Insufficient documentation

## 2022-02-09 DIAGNOSIS — I872 Venous insufficiency (chronic) (peripheral): Secondary | ICD-10-CM | POA: Insufficient documentation

## 2022-02-09 DIAGNOSIS — I10 Essential (primary) hypertension: Secondary | ICD-10-CM | POA: Diagnosis not present

## 2022-02-09 DIAGNOSIS — I890 Lymphedema, not elsewhere classified: Secondary | ICD-10-CM | POA: Diagnosis not present

## 2022-02-09 DIAGNOSIS — L97512 Non-pressure chronic ulcer of other part of right foot with fat layer exposed: Secondary | ICD-10-CM | POA: Diagnosis not present

## 2022-02-09 DIAGNOSIS — Z86718 Personal history of other venous thrombosis and embolism: Secondary | ICD-10-CM | POA: Insufficient documentation

## 2022-02-09 DIAGNOSIS — L97522 Non-pressure chronic ulcer of other part of left foot with fat layer exposed: Secondary | ICD-10-CM | POA: Diagnosis not present

## 2022-02-09 NOTE — Progress Notes (Addendum)
[name], [name] ([id number]) Visit Report for 02/09/2022 Chief Complaint Document Details Patient Name: Date of Service: [name], [name] 02/09/2022 9:00 A M Medical Record Number: [id number] Patient Account Number: [id number] Date of Birth/Sex: Treating RN: 09-03-1950 (71 y.o. [name] Primary Care Provider: PA [name], [place] Other Clinician: Referring Provider: Treating Provider/Extender: [name] in Treatment: 0 Information Obtained from: Patient Chief Complaint Patient seen for complaints of Non-Healing Wounds. Electronic Signature(s) Signed: 02/09/2022 11:01:30 AM By: Fredirick Maudlin MD FACS Entered By: Fredirick Maudlin on 02/09/2022 11:01:30 -------------------------------------------------------------------------------- Debridement Details Patient Name: Date of Service: [name] 02/09/2022 9:00 A M Medical Record Number: [id number] Patient Account Number: [id number] Date of Birth/Sex: Treating RN: 1950/10/15 (71 y.o. [name] Primary Care Provider: PA [name], [place] Other Clinician: Referring Provider: Treating Provider/Extender: [name] in Treatment: 0 Debridement Performed for Assessment: Wound #1 Left,Dorsal Foot Performed By: Physician Fredirick Maudlin, MD Debridement Type: Debridement Level of Consciousness (Pre-procedure): Awake and Alert Pre-procedure Verification/Time Out Yes - 10:45 Taken: Start Time: 10:47 Pain Control: Lidocaine 4% T opical Solution T Area Debrided (L x W): otal 2.3 (cm) x 2.1 (cm) = 4.83 (cm) Tissue and other material debrided: Viable, Non-Viable, Slough, Subcutaneous, Slough Level: Skin/Subcutaneous Tissue Debridement Description: Excisional Instrument: Curette Bleeding: Minimum Hemostasis Achieved: Pressure Procedural Pain: 4 Post Procedural Pain: 2 Response to Treatment: Procedure was tolerated well Level of Consciousness (Post- Awake and Alert procedure): Post  Debridement Measurements of Total Wound Length: (cm) 2.3 Width: (cm) 2.1 Depth: (cm) 0.4 Volume: (cm) 1.517 Character of Wound/Ulcer Post Debridement: Requires Further Debridement Post Procedure Diagnosis Same as Pre-procedure Electronic Signature(s) Signed: 02/09/2022 11:38:11 AM By: Fredirick Maudlin MD FACS Signed: 02/09/2022 5:31:58 PM By: Baruch Gouty RN, BSN Signed: 02/17/2022 5:39:28 PM By: Donavan Burnet CHT EMT BS , , Entered By: Donavan Burnet on 02/09/2022 10:55:07 -------------------------------------------------------------------------------- Debridement Details Patient Name: Date of Service: [name], [name] 02/09/2022 9:00 A M Medical Record Number: [id number] Patient Account Number: [id number] Date of Birth/Sex: Treating RN: 1951/03/18 (71 y.o. [name] Primary Care Provider: PA [name], [place] Other Clinician: Referring Provider: Treating Provider/Extender: [name] in Treatment: 0 Debridement Performed for Assessment: Wound #2 Right,Lateral Foot Performed By: Physician Fredirick Maudlin, MD Debridement Type: Debridement Level of Consciousness (Pre-procedure): Awake and Alert Pre-procedure Verification/Time Out Yes - 10:45 Taken: Start Time: 10:47 Pain Control: Lidocaine 4% T opical Solution T Area Debrided (L x W): otal 4 (cm) x 1.3 (cm) = 5.2 (cm) Tissue and other material debrided: Viable, Non-Viable, Slough, Slough Level: Non-Viable Tissue Debridement Description: Selective/Open Wound Instrument: Curette Bleeding: Minimum Hemostasis Achieved: Pressure Procedural Pain: 4 Post Procedural Pain: 2 Response to Treatment: Procedure was tolerated well Level of Consciousness (Post- Awake and Alert procedure): Post Debridement Measurements of Total Wound Length: (cm) 4 Width: (cm) 1.3 Depth: (cm) 0.1 Volume: (cm) 0.408 Character of Wound/Ulcer Post Debridement: Requires Further Debridement Post Procedure  Diagnosis Same as Pre-procedure Electronic Signature(s) Signed: 02/09/2022 11:38:11 AM By: Fredirick Maudlin MD FACS Signed: 02/09/2022 5:31:58 PM By: Baruch Gouty RN, BSN Signed: 02/17/2022 5:39:28 PM By: Donavan Burnet CHT EMT BS , , Entered By: Donavan Burnet on 02/09/2022 10:56:39 -------------------------------------------------------------------------------- Debridement Details Patient Name: Date of Service: [name], [name] 02/09/2022 9:00 A M Medical Record Number: [id number] Patient Account Number: [id number] Date of Birth/Sex: Treating RN: 12/19/1950 (71 y.o. [name] Primary Care Provider: PA [name], [place] Other Clinician: Referring Provider: Treating Provider/Extender: [name] in Treatment:  0 Debridement Performed for Assessment: Wound #3 Right,Medial Lower Leg Performed By: Physician Fredirick Maudlin, MD Debridement Type: Debridement Level of Consciousness (Pre-procedure): Awake and Alert Pre-procedure Verification/Time Out Yes - 10:45 Taken: Start Time: 10:47 Pain Control: Lidocaine 4% T opical Solution T Area Debrided (L x W): otal 1 (cm) x 1 (cm) = 1 (cm) Tissue and other material debrided: Non-Viable, Slough, Slough Level: Non-Viable Tissue Debridement Description: Selective/Open Wound Instrument: Curette Bleeding: Minimum Hemostasis Achieved: Pressure Procedural Pain: 4 Post Procedural Pain: 2 Response to Treatment: Procedure was tolerated well Level of Consciousness (Post- Awake and Alert procedure): Post Debridement Measurements of Total Wound Length: (cm) 2 Width: (cm) 3.4 Depth: (cm) 0.4 Volume: (cm) 2.136 Character of Wound/Ulcer Post Debridement: Requires Further Debridement Post Procedure Diagnosis Same as Pre-procedure Electronic Signature(s) Signed: 02/09/2022 11:38:11 AM By: Fredirick Maudlin MD FACS Signed: 02/09/2022 5:31:58 PM By: Baruch Gouty RN, BSN Signed: 02/17/2022 5:39:28 PM By: Donavan Burnet CHT EMT BS , , Entered By: Donavan Burnet on 02/09/2022 10:57:09 -------------------------------------------------------------------------------- HPI Details Patient Name: Date of Service: [name], [name] 02/09/2022 9:00 A M Medical Record Number: [id number] Patient Account Number: [id number] Date of Birth/Sex: Treating RN: 1951-05-06 (70 y.o. [name] Primary Care Provider: PA [name], [place] Other Clinician: Referring Provider: Treating Provider/Extender: [name] in Treatment: 0 History of Present Illness HPI Description: ADMISSION 02/09/2022 This is a 71 year old man who recently moved to [place] from [place]. He has a history of of coronary artery disease and prior left popliteal vein DVT . He is not diabetic and does not smoke. 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 He has had multiple spinal surgeries and has limited mobility. He primarily uses a motorized wheelchair. He has had lymphedema for many years and does have lymphedema pumps although he says he does not use them frequently. He has wounds on his bilateral lower extremities. He was receiving care in [place] for these prior to his move. They were apparently packing his wounds with Betadine soaked gauze. He has worn compression wraps in the past but not recently. He did say that they helped tremendously. In clinic today, his right ABI is noncompressible but has good signals; the left ABI was 1.17. No formal vascular studies have been performed. On his left dorsal foot, there is a circular lesion that exposes the fat layer. There is fairly heavy slough accumulation within the wound, but no significant odor. On his right medial calf, he has multiple pinholes that have slough buildup in them and probe for about 2 to 4 mm in depth. They are draining clear fluid. On his right lateral midfoot, he has ulceration consistent with venous stasis. It is geographic and has slough  accumulation. 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 Electronic Signature(s) Signed: 02/09/2022 11:16:23 AM By: Fredirick Maudlin MD FACS Entered By: Fredirick Maudlin on 02/09/2022 11:16:23 -------------------------------------------------------------------------------- Physical Exam Details Patient Name: Date of Service: [name] 02/09/2022 9:00 A M Medical Record Number: [id number] Patient Account Number: [id number] Date of Birth/Sex: Treating RN: 02-Mar-1951 (71 y.o. [name] Primary Care Provider: PA [name], [place] Other Clinician: Referring Provider: Treating Provider/Extender: Valeria Batman Weeks in Treatment: 0 Constitutional Slightly hypertensive. . . . No acute distress. Respiratory Normal work of breathing on room air.. Cardiovascular 2+ pitting edema to above the knees bilaterally. Notes 02/09/2022: On his left dorsal foot, there is a  circular lesion that exposes the fat layer. There is fairly heavy slough accumulation within the wound, but no significant odor. On his right medial calf, he has multiple pinholes that have slough buildup in them and probe for about 2 to 4 mm in depth. They are draining clear fluid. On his right lateral midfoot, he has ulceration consistent with venous stasis. It is geographic and has slough accumulation. 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 Electronic Signature(s) Signed: 02/09/2022 11:17:44 AM By: Fredirick Maudlin MD FACS Entered By: Fredirick Maudlin on 02/09/2022 11:17:43 -------------------------------------------------------------------------------- Physician Orders Details Patient Name: Date of Service: [name] 02/09/2022 9:00 A M Medical Record Number: [id number] Patient Account Number: [id number] Date of Birth/Sex: Treating  RN: 1950-09-21 (71 y.o. [name] Primary Care Provider: PA [name], [place] Other Clinician: Referring Provider: Treating Provider/Extender: [name] in Treatment: 0 Verbal / Phone Orders: No Diagnosis Coding ICD-10 Coding Code Description [phone] 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d L97.522 Non-pressure chronic ulcer of other part of left foot with fat layer exposed 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) I10 Essential (primary) hypertension I25.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 - Dr. [name] RM 1 with Vaughan Basta Return A Wed 7/26 @ 10:30 am Bathing/ Shower/ Hygiene May shower with protection but do not get wound dressing(s) wet. Edema Control - Lymphedema / SCD / Other Lymphedema Pumps. Use Lymphedema pumps on leg(s) 2-3 times a day for 45-60 minutes. If wearing any wraps or hose, do not remove them. Continue exercising as instructed. - use 1-2 times per day over wraps Elevate legs to the level of the heart or above for 30 minutes daily and/or when sitting, a frequency of: Exercise regularly Wound Treatment Wound #1 - Foot Wound Laterality: Dorsal, Left Peri-Wound Care: Sween Lotion (Moisturizing lotion) 1 x Per Week Discharge Instructions: Apply moisturizing lotion as directed Prim Dressing: KerraCel Ag Gelling Fiber Dressing, 4x5 in (silver alginate) 1 x Per Week ary Discharge Instructions: Apply silver alginate to wound bed as instructed Secondary Dressing: ABD Pad, 8x10 1 x Per Week Discharge Instructions: Apply over primary dressing as directed. Secondary Dressing: Woven Gauze Sponge, Non-Sterile 4x4 in 1 x Per Week Discharge Instructions: Apply over primary dressing as directed. Compression  Wrap: ThreePress (3 layer compression wrap) 1 x Per Week Discharge Instructions: Apply three layer compression as directed. Wound #2 - Foot Wound Laterality: Right, Lateral Peri-Wound Care: Sween Lotion (Moisturizing lotion) 1 x Per Week Discharge Instructions: Apply moisturizing lotion as directed Prim Dressing: KerraCel Ag Gelling Fiber Dressing, 4x5 in (silver alginate) 1 x Per Week ary Discharge Instructions: Apply silver alginate to wound bed as instructed Secondary Dressing: ABD Pad, 8x10 1 x Per Week Discharge Instructions: Apply over primary dressing as directed. Secondary Dressing: Woven Gauze Sponge, Non-Sterile 4x4 in 1 x Per Week Discharge Instructions: Apply over primary dressing as directed. Compression Wrap: ThreePress (3 layer compression wrap) 1 x Per Week Discharge Instructions: Apply three layer compression as directed. Wound #3 - Lower Leg Wound Laterality: Right, Medial Peri-Wound Care: Sween Lotion (Moisturizing lotion) 1 x Per Week Discharge Instructions: Apply moisturizing lotion as directed Prim Dressing: KerraCel Ag Gelling Fiber Dressing, 4x5 in (silver alginate) 1 x Per Week ary Discharge Instructions: Apply silver  alginate to wound bed as instructed Secondary Dressing: ABD Pad, 8x10 1 x Per Week Discharge Instructions: Apply over primary dressing as directed. Secondary Dressing: Woven Gauze Sponge, Non-Sterile 4x4 in 1 x Per Week Discharge Instructions: Apply over primary dressing as directed. Compression Wrap: ThreePress (3 layer compression wrap) 1 x Per Week Discharge Instructions: Apply three layer compression as directed. Services and Therapies rterial Studies with ABIs and TBIs- Bilateral - bilateral lower extremity edema with nonhealing ulcers A Electronic Signature(s) Signed: 02/09/2022 11:38:11 AM By: Fredirick Maudlin MD FACS Entered By: Fredirick Maudlin on 02/09/2022 11:21:01 Prescription  02/09/2022 -------------------------------------------------------------------------------- Zadie Rhine MD Patient Name: Provider: 02-May-1951 [id number] Date of Birth: NPI#Jerilynn Mages [id number] Sex: DEA #: [phone] Phone #: License #: [place] Patient Address: [address], [place] [address] [place], [place] [address] [phone] Allergies No Known Allergies Provider's Orders rterial Studies with ABIs and TBIs- Bilateral - bilateral lower extremity edema with nonhealing ulcers A Hand Signature: Date(s): Electronic Signature(s) Signed: 02/09/2022 11:24:00 AM By: Fredirick Maudlin MD FACS Entered By: Fredirick Maudlin on 02/09/2022 11:24:00 -------------------------------------------------------------------------------- Problem List Details Patient Name: Date of Service: [name] 02/09/2022 9:00 A M Medical Record Number: [id number] Patient Account Number: [id number] Date of Birth/Sex: Treating RN: 1950/08/16 (71 y.o. [name] Primary Care Provider: PA [name], [place] Other Clinician: Referring Provider: Treating Provider/Extender: [name] in Treatment: 0 Active Problems ICD-10 Encounter Code Description Active Date MDM Diagnosis L97.812 Non-pressure chronic ulcer of other part of right lower leg with fat layer 02/09/2022 No Yes exposed L97.522 Non-pressure chronic ulcer of other part of left foot with fat layer exposed 02/09/2022 No Yes 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) 02/09/2022 No Yes I10 Essential (primary) hypertension 02/09/2022 No Yes I25.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 02/09/2022 No Yes I89.0 Lymphedema, not elsewhere classified 02/09/2022 No Yes Z86.718 Personal history of other venous thrombosis and embolism 02/09/2022 No Yes Inactive Problems Resolved  Problems Electronic Signature(s) Signed: 02/09/2022 10:59:25 AM By: Fredirick Maudlin MD FACS Previous Signature: 02/09/2022 9:56:17 AM Version By: Fredirick Maudlin MD FACS Entered By: Fredirick Maudlin on 02/09/2022 10:59:25 -------------------------------------------------------------------------------- Progress Note Details Patient Name: Date of Service: [name] 02/09/2022 9:00 A M Medical Record Number: [id number] Patient Account Number: [id number] Date of Birth/Sex: Treating RN: December 31, 1950 (71 y.o. [name] Primary Care Provider: PA [name], [place] Other Clinician: Referring Provider: Treating Provider/Extender: [name] in Treatment: 0 Subjective Chief Complaint Information obtained from Patient Patient seen for complaints of Non-Healing Wounds. History of Present Illness (HPI) ADMISSION 02/09/2022 This is a 71 year old man who recently moved to [place] from [place]. He has a history of of coronary artery disease and prior left popliteal vein DVT . He is not diabetic and does not smoke. 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 He has had multiple spinal surgeries and has limited mobility. He primarily uses a motorized wheelchair. He has had lymphedema for many years and does have lymphedema pumps although he says he does not use them frequently. He has wounds on his bilateral lower extremities. He was receiving care in [place] for these prior to his move. They were apparently packing his wounds with Betadine soaked gauze. He has worn compression wraps in the past but not recently. He did say that they helped tremendously. In clinic today, his right ABI is noncompressible but has  good signals; the left ABI was 1.17. No formal vascular studies have been performed. On his left dorsal foot, there is a circular lesion that exposes the fat layer. There is fairly heavy slough accumulation within the wound, but no significant odor. On  his right medial calf, he has multiple pinholes that have slough buildup in them and probe for about 2 to 4 mm in depth. They are draining clear fluid. On his right lateral midfoot, he has ulceration consistent with venous stasis. It is geographic and has slough accumulation. 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 Patient History Information obtained from Patient, Chart. Allergies No Known Allergies Family History Cancer - Mother, Heart Disease - Father,Mother, Hypertension - Father,Siblings, Lung Disease - Siblings, No family history of Diabetes, Hereditary Spherocytosis, Kidney Disease, Seizures, Stroke, Thyroid Problems, Tuberculosis. Social History Former smoker - quit 1991, Marital Status - Married, Alcohol Use - Moderate, Drug Use - No History, Caffeine Use - Daily - coffee. Medical History Eyes Patient has history of Cataracts - right eye removed Denies history of Glaucoma, Optic Neuritis Ear/Nose/Mouth/Throat Denies history of Chronic sinus problems/congestion, Middle ear problems Hematologic/Lymphatic Patient has history of Anemia - macrocytic, Lymphedema Cardiovascular Patient has history of Congestive Heart Failure, Deep Vein Thrombosis - left leg, Hypertension, Peripheral Venous Disease Endocrine Denies history of Type I Diabetes, Type II Diabetes Genitourinary Denies history of End Stage Renal Disease Integumentary (Skin) Denies history of History of Burn Oncologic Denies history of Received Chemotherapy, Received Radiation Psychiatric Denies history of Anorexia/bulimia, Confinement Anxiety Hospitalization/Surgery History - cervical fusion. - lumbar surgery. - coronary stent placement. - lasik eye surgery. - hydrocele excision. Medical A Surgical History Notes nd Constitutional Symptoms (General Health) obesity Ear/Nose/Mouth/Throat hard of hearing Respiratory frozen  diaphragm right Cardiovascular hyperlipidemia Musculoskeletal myositis, lumbar DDD, spinal stenosis, cervical facet joint syndrome Review of Systems (ROS) Eyes Complains or has symptoms of Glasses / Contacts - reading. Denies complaints or symptoms of Dry Eyes, Vision Changes. Ear/Nose/Mouth/Throat Denies complaints or symptoms of Chronic sinus problems or rhinitis. Respiratory Denies complaints or symptoms of Chronic or frequent coughs, Shortness of Breath. Cardiovascular Denies complaints or symptoms of Chest pain. Gastrointestinal Denies complaints or symptoms of Frequent diarrhea, Nausea, Vomiting. Endocrine Denies complaints or symptoms of Heat/cold intolerance. Genitourinary Denies complaints or symptoms of Frequent urination. Integumentary (Skin) Complains or has symptoms of Wounds - both feet, right lower leg. Neurologic Denies complaints or symptoms of Numbness/parasthesias. Psychiatric Denies complaints or symptoms of Claustrophobia, Suicidal. Objective Constitutional Slightly hypertensive. No acute distress. Vitals Time Taken: 9:38 AM, Temperature: 98.4 F, Pulse: 74 bpm, Respiratory Rate: 18 breaths/min, Blood Pressure: 145/80 mmHg. Respiratory Normal work of breathing on room air.. Cardiovascular 2+ pitting edema to above the knees bilaterally. General Notes: 02/09/2022: On his left dorsal foot, there is a circular lesion that exposes the fat layer. There is fairly heavy slough accumulation within the wound, but no significant odor. On his right medial calf, he has multiple pinholes that have slough buildup in them and probe for about 2 to 4 mm in depth. They are draining clear fluid. On his right lateral midfoot, he has ulceration consistent with venous stasis. It is geographic and has slough accumulation. 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 Integumentary (Hair,  Skin) Wound #1 status is Open. Original cause of wound was Gradually Appeared. The date acquired was:  12/07/2021. The wound is located on the Left,Dorsal Foot. The wound measures 2.3cm length x 2.1cm width x 0.4cm depth; 3.793cm^2 area and 1.517cm^3 volume. There is Fat Layer (Subcutaneous Tissue) exposed. There is no tunneling noted, however, there is undermining starting at 4:00 and ending at 10:00 with a maximum distance of 0.7cm. There is a large amount of serous drainage noted. The wound margin is well defined and not attached to the wound base. There is small (1-33%) pink granulation within the wound bed. There is a large (67-100%) amount of necrotic tissue within the wound bed including Adherent Slough. Wound #2 status is Open. Original cause of wound was Gradually Appeared. The date acquired was: 11/02/2021. The wound is located on the Right,Lateral Foot. The wound measures 4cm length x 1.3cm width x 0.1cm depth; 4.084cm^2 area and 0.408cm^3 volume. There is Fat Layer (Subcutaneous Tissue) exposed. There is no tunneling or undermining noted. There is a medium amount of serous drainage noted. The wound margin is flat and intact. There is no granulation within the wound bed. There is a large (67-100%) amount of necrotic tissue within the wound bed including Adherent Slough. Wound #3 status is Open. Original cause of wound was Gradually Appeared. The date acquired was: 12/07/2021. The wound is located on the Right,Medial Lower Leg. The wound measures 2cm length x 3.4cm width x 0.4cm depth; 5.341cm^2 area and 2.136cm^3 volume. There is Fat Layer (Subcutaneous Tissue) exposed. There is no tunneling or undermining noted. There is a medium amount of serous drainage noted. The wound margin is indistinct and nonvisible. There is no granulation within the wound bed. There is a large (67-100%) amount of necrotic tissue within the wound bed including Adherent Slough. 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 There was a Excisional Skin/Subcutaneous Tissue Debridement with a total area of 4.83 sq cm performed by Fredirick Maudlin, MD. With the following instrument(s): Curette to remove Viable and Non-Viable tissue/material. Material removed includes Subcutaneous Tissue and Slough and after achieving pain control using Lidocaine 4% T opical Solution. No specimens were taken. A time out was conducted at 10:45, prior to the start of the procedure. A Minimum amount of bleeding was controlled with Pressure. The procedure was tolerated well with a pain level of 4 throughout and a pain level of 2 following the procedure. Post Debridement Measurements: 2.3cm length x 2.1cm width x 0.4cm depth; 1.517cm^3 volume. Character of Wound/Ulcer Post Debridement requires further debridement. Post procedure Diagnosis Wound #1: Same as Pre-Procedure Wound #2 Pre-procedure diagnosis of Wound #2 is a Lymphedema located on the Right,Lateral Foot . There was a Selective/Open Wound Non-Viable Tissue Debridement with a total area of 5.2 sq cm performed by Fredirick Maudlin, MD. With the following instrument(s): Curette to remove Viable and Non-Viable tissue/material. Material removed includes [facility] after achieving pain control using Lidocaine 4% Topical Solution. No specimens were taken. A time out was conducted at 10:45, prior to the start of the procedure. A Minimum amount of bleeding was controlled  with Pressure. The procedure was tolerated well with a pain level of 4 throughout and a  pain level of 2 following the procedure. Post Debridement Measurements: 4cm length x 1.3cm width x 0.1cm depth; 0.408cm^3 volume. Character of Wound/Ulcer Post Debridement requires further debridement. Post procedure Diagnosis Wound #2: Same as Pre-Procedure Wound #3 Pre-procedure diagnosis of Wound #3 is a Lymphedema located on the Right,Medial Lower Leg . There was a Selective/Open Wound Non-Viable Tissue Debridement with a total area of 1 sq cm performed by Fredirick Maudlin, MD. With the following instrument(s): Curette to remove Non-Viable tissue/material. Material removed includes [facility] after achieving pain control using Lidocaine 4% Topical Solution. No specimens were taken. A time out was conducted at 10:45, prior to the start of the procedure. A Minimum amount of bleeding was controlled with Pressure. The procedure was tolerated well with a pain level of 4 throughout and a pain level of 2 following the procedure. Post Debridement Measurements: 2cm length x 3.4cm width x 0.4cm depth; 2.136cm^3 volume. Character of Wound/Ulcer Post Debridement requires further debridement. Post procedure Diagnosis Wound #3: Same as Pre-Procedure Plan Follow-up Appointments: Return Appointment in 1 week. - Dr. [name] RM 1 with Delice Lesch 7/26 @ 10:30 am Bathing/ Shower/ Hygiene: May shower with protection but do not get wound dressing(s) wet. Edema Control - Lymphedema / SCD / Other: Lymphedema Pumps. Use Lymphedema pumps on leg(s) 2-3 times a day for 45-60 minutes. If wearing any wraps or hose, do not remove them. Continue exercising as instructed. - use 1-2 times per day over wraps Elevate legs to the level of the heart or above for 30 minutes daily and/or when sitting, a frequency of: Exercise regularly Services and Therapies ordered were: Arterial Studies with ABIs and TBIs- Bilateral - bilateral lower extremity  edema with nonhealing ulcers WOUND #1: - Foot Wound Laterality: Dorsal, Left Peri-Wound Care: Sween Lotion (Moisturizing lotion) 1 x Per Week/ Discharge Instructions: Apply moisturizing lotion as directed Prim Dressing: KerraCel Ag Gelling Fiber Dressing, 4x5 in (silver alginate) 1 x Per Week/ ary Discharge Instructions: Apply silver alginate to wound bed as instructed Secondary Dressing: ABD Pad, 8x10 1 x Per Week/ Discharge Instructions: Apply over primary dressing as directed. Secondary Dressing: Woven Gauze Sponge, Non-Sterile 4x4 in 1 x Per Week/ Discharge Instructions: Apply over primary dressing as directed. Com pression Wrap: ThreePress (3 layer compression wrap) 1 x Per Week/ Discharge Instructions: Apply three layer compression as directed. WOUND #2: - Foot Wound Laterality: Right, Lateral Peri-Wound Care: Sween Lotion (Moisturizing lotion) 1 x Per Week/ Discharge Instructions: Apply moisturizing lotion as directed Prim Dressing: KerraCel Ag Gelling Fiber Dressing, 4x5 in (silver alginate) 1 x Per Week/ ary Discharge Instructions: Apply silver alginate to wound bed as instructed Secondary Dressing: ABD Pad, 8x10 1 x Per Week/ Discharge Instructions: Apply over primary dressing as directed. Secondary Dressing: Woven Gauze Sponge, Non-Sterile 4x4 in 1 x Per Week/ Discharge Instructions: Apply over primary dressing as directed. Com pression Wrap: ThreePress (3 layer compression wrap) 1 x Per Week/ Discharge Instructions: Apply three layer compression as directed. WOUND #3: - Lower Leg Wound Laterality: Right, Medial Peri-Wound Care: Sween Lotion (Moisturizing lotion) 1 x Per Week/ Discharge Instructions: Apply moisturizing lotion as directed Prim Dressing: KerraCel Ag Gelling Fiber Dressing, 4x5 in (silver alginate) 1 x Per Week/ ary Discharge Instructions: Apply silver alginate to wound bed as instructed Secondary Dressing: ABD Pad, 8x10 1 x Per Week/ Discharge Instructions:  Apply over primary dressing as directed. Secondary Dressing: Woven Gauze Sponge, Non-Sterile 4x4 in 1 x Per Week/ Discharge Instructions: Apply over primary dressing as directed.  Com pression Wrap: ThreePress (3 layer compression wrap) 1 x Per Week/ Discharge Instructions: Apply three layer compression as directed. 02/09/2022: This is a 71 year old man with a known history of lymphedema and likely venous insufficiency. He has multiple wounds for which she was previously being treated in [place]. He recently moved to [facility] and is here for ongoing management. On his left dorsal foot, there is a circular lesion that exposes the fat layer. There is fairly heavy slough accumulation within the wound, but no significant odor. On his right medial calf, he has multiple pinholes that have slough buildup in them and probe for about 2 to 4 mm in depth. They are draining clear fluid. On his right lateral midfoot, he has ulceration consistent with venous stasis. It is geographic and has slough accumulation. 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 I used a curette to debride slough and nonviable subcutaneous tissue from the left dorsal foot wound and the right lateral foot wound. I debrided slough from the multiple punctate wounds on his right medial calf. We will use silver alginate to all of the wound sites and apply 3 layer compression. He does have venous reflux studies ordered and scheduled for August 9; I think he would also benefit from arterial studies due to his noncompressible ABI and so we will order these and hopefully they can be done at the same visit. He was encouraged to use his lymphedema pumps at least daily and keep his legs elevated is much as possible. He will follow-up in 1 week. Electronic Signature(s) Signed: 02/09/2022 11:23:25 AM By: Fredirick Maudlin MD FACS Entered By: Fredirick Maudlin on  02/09/2022 11:23:25 -------------------------------------------------------------------------------- HxROS Details Patient Name: Date of Service: [name] 02/09/2022 9:00 A M Medical Record Number: [id number] Patient Account Number: [id number] Date of Birth/Sex: Treating RN: 1950-09-30 (71 y.o. [name] Primary Care Provider: PA [name], [place] Other Clinician: Referring Provider: Treating Provider/Extender: [name] in Treatment: 0 Information Obtained From Patient Chart Eyes Complaints and Symptoms: Positive for: Glasses / Contacts - reading Negative for: Dry Eyes; Vision Changes Medical History: Positive for: Cataracts - right eye removed Negative for: Glaucoma; Optic Neuritis Ear/Nose/Mouth/Throat Complaints and Symptoms: Negative for: Chronic sinus problems or rhinitis Medical History: Negative for: Chronic sinus problems/congestion; Middle ear problems Past Medical History Notes: hard of hearing Respiratory Complaints and Symptoms: Negative for: Chronic or frequent coughs; Shortness of Breath Medical History: Past Medical History Notes: frozen diaphragm right Cardiovascular Complaints and Symptoms: Negative for: Chest pain Medical History: Positive for: Congestive Heart Failure; Deep Vein Thrombosis - left leg; Hypertension; Peripheral Venous Disease Past Medical History Notes: hyperlipidemia Gastrointestinal Complaints and Symptoms: Negative for: Frequent diarrhea; Nausea; Vomiting Endocrine Complaints and Symptoms: Negative for: Heat/cold intolerance Medical History: Negative for: Type I Diabetes; Type II Diabetes Genitourinary Complaints and Symptoms: Negative for: Frequent urination Medical History: Negative for: End Stage Renal Disease Integumentary (Skin) Complaints and Symptoms: Positive for: Wounds - both feet, right lower leg Medical History: Negative for: History of Burn Neurologic Complaints and  Symptoms: Negative for: Numbness/parasthesias Psychiatric Complaints and Symptoms: Negative for: Claustrophobia; Suicidal Medical History: Negative for: Anorexia/bulimia; Confinement Anxiety Constitutional Symptoms (General Health) Medical History: Past Medical History Notes: obesity Hematologic/Lymphatic Medical History: Positive for: Anemia - macrocytic; Lymphedema Immunological Musculoskeletal Medical History: Past Medical History Notes: myositis, lumbar DDD, spinal stenosis, cervical facet joint syndrome Oncologic Medical History: Negative for: Received Chemotherapy; Received  Radiation HBO Extended History Items Eyes: Cataracts Immunizations Pneumococcal Vaccine: Received Pneumococcal Vaccination: Yes Received Pneumococcal Vaccination On or After 60th Birthday: Yes Implantable Devices No devices added Hospitalization / Surgery History Type of Hospitalization/Surgery cervical fusion lumbar surgery coronary stent placement lasik eye surgery hydrocele excision Family and Social History Cancer: Yes - Mother; Diabetes: No; Heart Disease: Yes - Father,Mother; Hereditary Spherocytosis: No; Hypertension: Yes - Father,Siblings; Kidney Disease: No; Lung Disease: Yes - Siblings; Seizures: No; Stroke: No; Thyroid Problems: No; Tuberculosis: No; Former smoker - quit 1991; Marital Status - Married; Alcohol Use: Moderate; Drug Use: No History; Caffeine Use: Daily - coffee; Financial Concerns: No; Food, Clothing or Shelter Needs: No; Support System Lacking: No; Transportation Concerns: No Electronic Signature(s) Signed: 02/09/2022 11:38:11 AM By: Fredirick Maudlin MD FACS Signed: 02/09/2022 5:31:58 PM By: Baruch Gouty RN, BSN Signed: 02/17/2022 5:39:28 PM By: Donavan Burnet CHT EMT BS , , Entered By: Donavan Burnet on 02/09/2022 10:06:42 -------------------------------------------------------------------------------- SuperBill Details Patient Name: Date of Service: [name], [name] 02/09/2022 Medical Record Number: [id number] Patient Account Number: [id number] Date of Birth/Sex: Treating RN: 09-24-1950 (71 y.o. [name] Primary Care Provider: PA [name], [place] Other Clinician: Referring Provider: Treating Provider/Extender: [name] in Treatment: 0 Diagnosis Coding ICD-10 Codes Code Description [phone] 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d L97.522 Non-pressure chronic ulcer of other part of left foot with fat layer exposed 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) I10 Essential (primary) hypertension I25.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 I89.0 Lymphedema, not elsewhere classified Z86.718 Personal history of other venous thrombosis and embolism Facility Procedures CPT4 Code: [id number] Description: 27035 - DEB SUBQ TISSUE 20 SQ CM/< ICD-10 Diagnosis Description 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 L97.522 Non-pressure chronic ulcer of other part of left foot with fat layer exposed Modifier: ed Quantity: 1 CPT4 Code: [id number] Description: 99371 - DEBRIDE WOUND 1ST 20 SQ CM OR < ICD-10 Diagnosis Description 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 Modifier: ed Quantity: 1 Physician Procedures : CPT4 Code Description Modifier [id number] - WC PHYS LEVEL 4 - NEW PT 25 ICD-10 Diagnosis Description 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d L97.522 Non-pressure chronic ulcer of other part of left  foot with fat layer exposed 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) I89.0 Lymphedema, not elsewhere classified Quantity: 1 : [id number] 27782 - WC PHYS SUBQ TISS 20 SQ CM ICD-10 Diagnosis Description L97.812 Non-pressure chronic ulcer of other part of right lower leg with fat layer  exposed L97.522 Non-pressure chronic ulcer of other part of left foot with fat layer exposed Quantity: 1 : [id number] 44315 - WC PHYS DEBR WO ANESTH 20 SQ CM ICD-10 Diagnosis Description 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d Quantity: 1 Electronic Signature(s) Signed: 02/09/2022 11:23:55 AM By: Fredirick Maudlin MD FACS Entered By: Fredirick Maudlin on 02/09/2022 11:23:55

## 2022-02-17 ENCOUNTER — Encounter (HOSPITAL_BASED_OUTPATIENT_CLINIC_OR_DEPARTMENT_OTHER): Payer: Medicare Other | Admitting: General Surgery

## 2022-02-17 DIAGNOSIS — I890 Lymphedema, not elsewhere classified: Secondary | ICD-10-CM | POA: Diagnosis not present

## 2022-02-17 DIAGNOSIS — L97812 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d: Secondary | ICD-10-CM | POA: Diagnosis not present

## 2022-02-17 DIAGNOSIS — I10 Essential (primary) hypertension: Secondary | ICD-10-CM | POA: Diagnosis not present

## 2022-02-17 DIAGNOSIS — L97522 Non-pressure chronic ulcer of other part of left foot with fat layer exposed: Secondary | ICD-10-CM | POA: Diagnosis not present

## 2022-02-17 DIAGNOSIS — I872 Venous insufficiency (chronic) (peripheral): Secondary | ICD-10-CM | POA: Diagnosis not present

## 2022-02-17 DIAGNOSIS — L97512 Non-pressure chronic ulcer of other part of right foot with fat layer exposed: Secondary | ICD-10-CM | POA: Diagnosis not present

## 2022-02-17 DIAGNOSIS — I251 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: Secondary | ICD-10-CM | POA: Diagnosis not present

## 2022-02-17 NOTE — Progress Notes (Signed)
[name], [name] ([id number]) Visit Report for 02/09/2022 Abuse Risk Screen Details Patient Name: Date of Service: [name], [name] 02/09/2022 9:00 A M Medical Record Number: [id number] Patient Account Number: [id number] Date of Birth/Sex: Treating RN: [birth year] (71 y.o. [name] Primary Care Tonianne Fine: PA [name], [place] Other Clinician: Referring Ikia Cincotta: Treating Aariya Ferrick/Extender: [name] in Treatment: 0 Abuse Risk Screen Items Answer ABUSE RISK SCREEN: Has anyone close to you tried to hurt or harm you recentlyo No Do you feel uncomfortable with anyone in your familyo No Has anyone forced you do things that you didnt want to doo No Electronic Signature(s) Signed: 02/09/2022 5:31:58 PM By: Baruch Gouty RN, BSN Signed: 02/17/2022 5:39:28 PM By: Donavan Burnet CHT EMT BS , , Entered By: Donavan Burnet on 02/09/2022 10:06:49 -------------------------------------------------------------------------------- Activities of Daily Living Details Patient Name: Date of Service: [name], [name] 02/09/2022 9:00 A M Medical Record Number: [id number] Patient Account Number: [id number] Date of Birth/Sex: Treating RN: 10-Sep-1950 (71 y.o. [name] Primary Care [name]: PA [name], [place] Other Clinician: Referring [name]: Treating [name]/Extender: [name] in Treatment: 0 Activities of Daily Living Items Answer Activities of Daily Living (Please select one for each item) Drive Automobile Completely Able T Medications ake Completely Able Use T elephone Completely Able Care for Appearance Completely Able Use T oilet Completely Able Bath / Shower Completely Able Dress Self Completely Able Feed Self Completely Able Walk Need Assistance Get In / Out Bed Completely Able Housework Need Assistance Prepare Meals Completely [place] for Self Completely Able Electronic Signature(s) Signed: 02/09/2022  5:31:58 PM By: Baruch Gouty RN, BSN Signed: 02/17/2022 5:39:28 PM By: Donavan Burnet CHT EMT BS , , Entered By: Donavan Burnet on 02/09/2022 10:07:17 -------------------------------------------------------------------------------- Education Screening Details Patient Name: Date of Service: [name], [name] 02/09/2022 9:00 A M Medical Record Number: [id number] Patient Account Number: [id number] Date of Birth/Sex: Treating RN: 20-Jun-1951 (71 y.o. [name] Primary Care [name]: PA [name], [place] Other Clinician: Referring [name]: Treating [name]/Extender: [name] in Treatment: 0 Primary Learner Assessed: Patient Learning Preferences/Education Level/Primary Language Learning Preference: Explanation, Demonstration, Printed Material Highest Education Level: High School Preferred Language: English Cognitive Barrier Language Barrier: No Translator Needed: No Memory Deficit: No Emotional Barrier: No Cultural/Religious Beliefs Affecting Medical Care: No Physical Barrier Impaired Vision: No Impaired Hearing: Yes Hearing Aid Decreased Hand dexterity: No Knowledge/Comprehension Knowledge Level: High Comprehension Level: High Ability to understand written instructions: High Ability to understand verbal instructions: High Motivation Anxiety Level: Calm Cooperation: Cooperative Education Importance: Acknowledges Need Interest in Health Problems: Asks Questions Perception: Coherent Willingness to Engage in Self-Management High Activities: Readiness to Engage in Self-Management High Activities: Electronic Signature(s) Signed: 02/09/2022 5:31:58 PM By: Baruch Gouty RN, BSN Signed: 02/17/2022 5:39:28 PM By: Donavan Burnet CHT EMT BS , , Entered By: Donavan Burnet on 02/09/2022 10:08:11 -------------------------------------------------------------------------------- Fall Risk Assessment Details Patient Name: Date of Service: [name], RO  [name] 02/09/2022 9:00 A M Medical Record Number: [id number] Patient Account Number: [id number] Date of Birth/Sex: Treating RN: 28-May-1951 (71 y.o. [name] Primary Care [name]: PA [name], [place] Other Clinician: Referring [name]: Treating [name]/Extender: [name] in Treatment: 0 Fall Risk Assessment Items Have you had 2 or more falls in the last 12 monthso 0 Yes Have you had any fall that resulted in injury in the last 12 monthso 0 No FALLS RISK SCREEN History of falling - immediate or within 3 months 0 No  Secondary diagnosis (Do you have 2 or more medical diagnoseso) 0 No Ambulatory aid None/bed rest/wheelchair/nurse 0 No Crutches/cane/walker 15 Yes Furniture 0 No Intravenous therapy Access/Saline/Heparin Lock 0 No Gait/Transferring Normal/ bed rest/ wheelchair 0 No Weak (short steps with or without shuffle, stooped but able to lift head while walking, may seek 10 Yes support from furniture) Impaired (short steps with shuffle, may have difficulty arising from chair, head down, impaired 0 No balance) Mental Status Oriented to own ability 0 Yes Electronic Signature(s) Signed: 02/09/2022 5:31:58 PM By: Baruch Gouty RN, BSN Signed: 02/17/2022 5:39:28 PM By: Donavan Burnet CHT EMT BS , , Entered By: Donavan Burnet on 02/09/2022 10:08:39 -------------------------------------------------------------------------------- Foot Assessment Details Patient Name: Date of Service: [name] 02/09/2022 9:00 A M Medical Record Number: [id number] Patient Account Number: [id number] Date of Birth/Sex: Treating RN: 07-Apr-1951 (71 y.o. [name] Primary Care [name]: PA [name], [place] Other Clinician: Referring [name]: Treating Zeferino Mounts/Extender: [name] in Treatment: 0 Foot Assessment Items Site Locations + = Sensation present, - = Sensation absent, C = Callus, U = Ulcer R = Redness, W = Warmth, M =  Maceration, PU = Pre-ulcerative lesion F = Fissure, S = Swelling, D = Dryness Assessment Right: Left: Other Deformity: No No Prior Foot Ulcer: No No Prior Amputation: No No Charcot Joint: No No Ambulatory Status: Ambulatory With Help Assistance Device: Walker Gait: Steady Electronic Signature(s) Signed: 02/09/2022 5:31:58 PM By: Baruch Gouty RN, BSN Signed: 02/17/2022 5:39:28 PM By: Donavan Burnet CHT EMT BS , , Entered By: Donavan Burnet on 02/09/2022 10:12:49 -------------------------------------------------------------------------------- Nutrition Risk Screening Details Patient Name: Date of Service: [name] 02/09/2022 9:00 A M Medical Record Number: [id number] Patient Account Number: [id number] Date of Birth/Sex: Treating RN: Jul 21, 1951 (71 y.o. [name] Primary Care France Noyce: PA [name], [place] Other Clinician: Referring Valborg Friar: Treating Parnika Tweten/Extender: [name] in Treatment: 0 Height (in): Weight (lbs): Body Mass Index (BMI): Nutrition Risk Screening Items Score Screening NUTRITION RISK SCREEN: I have an illness or condition that made me change the kind and/or amount of food I eat 0 No I eat fewer than two meals per day 0 No I eat few fruits and vegetables, or milk products 0 No I have three or more drinks of beer, liquor or wine almost every day 0 No I have tooth or mouth problems that make it hard for me to eat 0 No I don't always have enough money to buy the food I need 0 No I eat alone most of the time 0 No I take three or more different prescribed or over-the-counter drugs a day 1 Yes Without wanting to, I have lost or gained 10 pounds in the last six months 2 Yes I am not always physically able to shop, cook and/or feed myself 0 No Nutrition Protocols Good Risk Protocol Moderate Risk Protocol 0 Provide education on nutrition High Risk Proctocol Risk Level: Moderate Risk Score: 3 Electronic  Signature(s) Signed: 02/09/2022 5:31:58 PM By: Baruch Gouty RN, BSN Signed: 02/17/2022 5:39:28 PM By: Donavan Burnet CHT EMT BS , , Entered By: Donavan Burnet on 02/09/2022 10:09:09

## 2022-02-17 NOTE — Progress Notes (Signed)
[name], [name] ([id number]) Visit Report for 02/17/2022 Chief Complaint Document Details Patient Name: Date of Service: [name], [name] 02/17/2022 10:30 A M Medical Record Number: [id number] Patient Account Number: [id number] Date of Birth/Sex: Treating RN: 20-Jul-1951 (71 y.o. [name] Primary Care Provider: PA [name], [place] Other Clinician: Referring Provider: Treating Provider/Extender: [name] in Treatment: 1 Information Obtained from: Patient Chief Complaint Patient seen for complaints of Non-Healing Wounds. Electronic Signature(s) Signed: 02/17/2022 12:06:22 PM By: Fredirick Maudlin MD FACS Entered By: Fredirick Maudlin on 02/17/2022 12:06:22 -------------------------------------------------------------------------------- Debridement Details Patient Name: Date of Service: [name] 02/17/2022 10:30 A M Medical Record Number: [id number] Patient Account Number: [id number] Date of Birth/Sex: Treating RN: 02-Apr-1951 (71 y.o. [name] Primary Care Provider: PA [name], [place] Other Clinician: Referring Provider: Treating Provider/Extender: [name] in Treatment: 1 Debridement Performed for Assessment: Wound #3 Right,Medial Lower Leg Performed By: Physician Fredirick Maudlin, MD Debridement Type: Debridement Level of Consciousness (Pre-procedure): Awake and Alert Pre-procedure Verification/Time Out Yes - 11:30 Taken: Start Time: 11:33 Pain Control: Lidocaine 4% T opical Solution T Area Debrided (L x W): otal 1.5 (cm) x 2 (cm) = 3 (cm) Tissue and other material debrided: Viable, Non-Viable, Slough, Subcutaneous, Slough Level: Skin/Subcutaneous Tissue Debridement Description: Excisional Instrument: Curette Specimen: Tissue Culture Number of Specimens T aken: 1 Bleeding: Minimum Hemostasis Achieved: Pressure Procedural Pain: 2 Post Procedural Pain: 0 Response to Treatment: Procedure was tolerated well Level of  Consciousness (Post- Awake and Alert procedure): Post Debridement Measurements of Total Wound Length: (cm) 2.1 Width: (cm) 3.3 Depth: (cm) 1 Volume: (cm) 5.443 Character of Wound/Ulcer Post Debridement: Requires Further Debridement Post Procedure Diagnosis Same as Pre-procedure Electronic Signature(s) Signed: 02/17/2022 12:38:36 PM By: Fredirick Maudlin MD FACS Signed: 02/17/2022 2:57:53 PM By: Baruch Gouty RN, BSN Entered By: Baruch Gouty on 02/17/2022 11:39:57 -------------------------------------------------------------------------------- Debridement Details Patient Name: Date of Service: [name] 02/17/2022 10:30 A M Medical Record Number: [id number] Patient Account Number: [id number] Date of Birth/Sex: Treating RN: 1951/01/18 (71 y.o. [name] Primary Care Provider: PA [name], [place] Other Clinician: Referring Provider: Treating Provider/Extender: [name] in Treatment: 1 Debridement Performed for Assessment: Wound #1 Left,Dorsal Foot Performed By: Physician Fredirick Maudlin, MD Debridement Type: Debridement Level of Consciousness (Pre-procedure): Awake and Alert Pre-procedure Verification/Time Out Yes - 11:30 Taken: Start Time: 11:33 Pain Control: Lidocaine 4% T opical Solution T Area Debrided (L x W): otal 2.6 (cm) x 2.6 (cm) = 6.76 (cm) Tissue and other material debrided: Viable, Non-Viable, Slough, Subcutaneous, Slough Level: Skin/Subcutaneous Tissue Debridement Description: Excisional Instrument: Curette Specimen: Tissue Culture Number of Specimens T aken: 1 Bleeding: Minimum Hemostasis Achieved: Pressure Procedural Pain: 2 Post Procedural Pain: 0 Response to Treatment: Procedure was tolerated well Level of Consciousness (Post- Awake and Alert procedure): Post Debridement Measurements of Total Wound Length: (cm) 2.6 Width: (cm) 2.6 Depth: (cm) 0.6 Volume: (cm) 3.186 Character of Wound/Ulcer Post Debridement:  Requires Further Debridement Post Procedure Diagnosis Same as Pre-procedure Electronic Signature(s) Signed: 02/17/2022 12:38:36 PM By: Fredirick Maudlin MD FACS Signed: 02/17/2022 2:57:53 PM By: Baruch Gouty RN, BSN Entered By: Baruch Gouty on 02/17/2022 11:40:55 -------------------------------------------------------------------------------- Debridement Details Patient Name: Date of Service: [name] 02/17/2022 10:30 A M Medical Record Number: [id number] Patient Account Number: [id number] Date of Birth/Sex: Treating RN: 11/06/1950 (71 y.o. [name] Primary Care Provider: PA [name], [place] Other Clinician: Referring Provider: Treating Provider/Extender: [name] in Treatment: 1 Debridement Performed for Assessment:  Wound #2 Right,Lateral Foot Performed By: Physician Fredirick Maudlin, MD Debridement Type: Debridement Level of Consciousness (Pre-procedure): Awake and Alert Pre-procedure Verification/Time Out Yes - 11:30 Taken: Start Time: 11:33 Pain Control: Lidocaine 4% T opical Solution T Area Debrided (L x W): otal 2.2 (cm) x 1 (cm) = 2.2 (cm) Tissue and other material debrided: Viable, Non-Viable, Slough, Subcutaneous, Slough Level: Skin/Subcutaneous Tissue Debridement Description: Excisional Instrument: Curette Bleeding: Minimum Hemostasis Achieved: Pressure Procedural Pain: 2 Post Procedural Pain: 0 Response to Treatment: Procedure was tolerated well Level of Consciousness (Post- Awake and Alert procedure): Post Debridement Measurements of Total Wound Length: (cm) 2.2 Width: (cm) 1 Depth: (cm) 0.1 Volume: (cm) 0.173 Character of Wound/Ulcer Post Debridement: Requires Further Debridement Post Procedure Diagnosis Same as Pre-procedure Electronic Signature(s) Signed: 02/17/2022 12:38:36 PM By: Fredirick Maudlin MD FACS Signed: 02/17/2022 2:57:53 PM By: Baruch Gouty RN, BSN Entered By: Baruch Gouty on 02/17/2022  11:41:57 -------------------------------------------------------------------------------- Debridement Details Patient Name: Date of Service: [name] 02/17/2022 10:30 A M Medical Record Number: [id number] Patient Account Number: [id number] Date of Birth/Sex: Treating RN: 10-07-1950 (71 y.o. [name] Primary Care Provider: PA [name], [place] Other Clinician: Referring Provider: Treating Provider/Extender: [name] in Treatment: 1 Debridement Performed for Assessment: Wound #4 Right,Medial Foot Performed By: Physician Fredirick Maudlin, MD Debridement Type: Debridement Level of Consciousness (Pre-procedure): Awake and Alert Pre-procedure Verification/Time Out Yes - 11:30 Taken: Start Time: 11:33 Pain Control: Lidocaine 4% T opical Solution T Area Debrided (L x W): otal 0.8 (cm) x 0.5 (cm) = 0.4 (cm) Tissue and other material debrided: Non-Viable, Slough, Slough Level: Non-Viable Tissue Debridement Description: Selective/Open Wound Instrument: Curette Specimen: Tissue Culture Number of Specimens T aken: 1 Bleeding: Minimum Hemostasis Achieved: Pressure Procedural Pain: 2 Post Procedural Pain: 0 Response to Treatment: Procedure was tolerated well Level of Consciousness (Post- Awake and Alert procedure): Post Debridement Measurements of Total Wound Length: (cm) 0.8 Width: (cm) 0.5 Depth: (cm) 0.1 Volume: (cm) 0.031 Character of Wound/Ulcer Post Debridement: Improved Post Procedure Diagnosis Same as Pre-procedure Electronic Signature(s) Signed: 02/17/2022 12:38:36 PM By: Fredirick Maudlin MD FACS Signed: 02/17/2022 2:57:53 PM By: Baruch Gouty RN, BSN Entered By: Baruch Gouty on 02/17/2022 11:42:39 -------------------------------------------------------------------------------- HPI Details Patient Name: Date of Service: [name] 02/17/2022 10:30 A M Medical Record Number: [id number] Patient Account Number: [id number] Date of  Birth/Sex: Treating RN: February 26, 1951 (71 y.o. [name] Primary Care Provider: PA [name], [place] Other Clinician: Referring Provider: Treating Provider/Extender: [name] in Treatment: 1 History of Present Illness HPI Description: ADMISSION 02/09/2022 This is a 71 year old man who recently moved to [place] from [place]. He has a history of of coronary artery disease and prior left popliteal vein DVT . He is not diabetic and does not smoke. 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 He has had multiple spinal surgeries and has limited mobility. He primarily uses a motorized wheelchair. He has had lymphedema for many years and does have lymphedema pumps although he says he does not use them frequently. He has wounds on his bilateral lower extremities. He was receiving care in [place] for these prior to his move. They were apparently packing his wounds with Betadine soaked gauze. He has worn compression wraps in the past but not recently. He did say that they helped tremendously. In clinic today, his right ABI is noncompressible but has good signals; the left ABI was 1.17. No formal vascular studies have been performed. On his left  dorsal foot, there is a circular lesion that exposes the fat layer. There is fairly heavy slough accumulation within the wound, but no significant odor. On his right medial calf, he has multiple pinholes that have slough buildup in them and probe for about 2 to 4 mm in depth. They are draining clear fluid. On his right lateral midfoot, he has ulceration consistent with venous stasis. It is geographic and has slough accumulation. 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 02/17/2022: All of the wounds are little bit larger today and have more slough accumulation. Today, the medial calf openings are larger and probe to something that feels  calcified. There is odor coming from his wounds. His vascular studies are scheduled for August 9 and 10. Electronic Signature(s) Signed: 02/17/2022 12:07:21 PM By: Fredirick Maudlin MD FACS Entered By: Fredirick Maudlin on 02/17/2022 12:07:20 -------------------------------------------------------------------------------- Physical Exam Details Patient Name: Date of Service: [name] 02/17/2022 10:30 A M Medical Record Number: [id number] Patient Account Number: [id number] Date of Birth/Sex: Treating RN: 18-Jan-1951 (71 y.o. [name] Primary Care Provider: PA [name], [place] Other Clinician: Referring Provider: Treating Provider/Extender: Valeria Batman Weeks in Treatment: 1 Constitutional Slightly hypertensive. . . . No acute distress.Marland Kitchen Respiratory Normal work of breathing on room air.. Notes 02/17/2022: All of the wounds are little bit larger today and have more slough accumulation. Today, the medial calf openings are larger and probe to something that feels calcified. There is odor coming from his wounds. Electronic Signature(s) Signed: 02/17/2022 12:18:57 PM By: Fredirick Maudlin MD FACS Entered By: Fredirick Maudlin on 02/17/2022 12:18:56 -------------------------------------------------------------------------------- Physician Orders Details Patient Name: Date of Service: [name] 02/17/2022 10:30 A M Medical Record Number: [id number] Patient Account Number: [id number] Date of Birth/Sex: Treating RN: 10/12/1950 (71 y.o. [name] Primary Care Provider: PA [name], [place] Other Clinician: Referring Provider: Treating Provider/Extender: [name] in Treatment: 1 Verbal / Phone Orders: No Diagnosis Coding ICD-10 Coding Code Description [phone] 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d L97.522 Non-pressure chronic ulcer of other part of left foot with fat layer exposed I87.2 Venous  insufficiency (chronic) (peripheral) I10 Essential (primary) hypertension I25.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 - Dr.Hoffman (for Dr. [name]) Rm 2 Return A Wed 8/2 @ 10:00 am ppointment in 2 weeks. - Dr. [name] Rm 1 Return A Wed. 8/9 @ 10:30 am Bathing/ Shower/ Hygiene May shower with protection but do not get wound dressing(s) wet. Edema Control - Lymphedema / SCD / Other Lymphedema Pumps. Use Lymphedema pumps on leg(s) 2-3 times a day for 45-60 minutes. If wearing any wraps or hose, do not remove them. Continue exercising as instructed. - use 1-2 times per day over wraps Elevate legs to the level of the heart or above for 30 minutes daily and/or when sitting, a frequency of: Exercise regularly Wound Treatment Wound #1 - Foot Wound Laterality: Dorsal, Left Peri-Wound Care: Sween Lotion (Moisturizing lotion) 1 x Per Week Discharge Instructions: Apply moisturizing lotion as directed Topical: Gentamicin 1 x Per Week Discharge Instructions: As directed by physician Prim Dressing: KerraCel Ag Gelling Fiber Dressing, 4x5 in (silver alginate) 1 x Per Week ary Discharge Instructions: Apply silver alginate to wound bed as instructed Secondary Dressing: ABD  Pad, 8x10 1 x Per Week Discharge Instructions: Apply over primary dressing as directed. Secondary Dressing: Woven Gauze Sponge, Non-Sterile 4x4 in 1 x Per Week Discharge Instructions: Apply over primary dressing as directed. Compression Wrap: ThreePress (3 layer compression wrap) 1 x Per Week Discharge Instructions: Apply three layer compression as directed. Wound #2 - Foot Wound Laterality: Right, Lateral Peri-Wound Care: Sween Lotion (Moisturizing lotion) 1 x Per Week Discharge Instructions: Apply moisturizing lotion as  directed Topical: Gentamicin 1 x Per Week Discharge Instructions: As directed by physician Prim Dressing: KerraCel Ag Gelling Fiber Dressing, 4x5 in (silver alginate) 1 x Per Week ary Discharge Instructions: Apply silver alginate to wound bed as instructed Secondary Dressing: ABD Pad, 8x10 1 x Per Week Discharge Instructions: Apply over primary dressing as directed. Secondary Dressing: Woven Gauze Sponge, Non-Sterile 4x4 in 1 x Per Week Discharge Instructions: Apply over primary dressing as directed. Compression Wrap: ThreePress (3 layer compression wrap) 1 x Per Week Discharge Instructions: Apply three layer compression as directed. Wound #3 - Lower Leg Wound Laterality: Right, Medial Peri-Wound Care: Sween Lotion (Moisturizing lotion) 1 x Per Week Discharge Instructions: Apply moisturizing lotion as directed Topical: Gentamicin 1 x Per Week Discharge Instructions: As directed by physician Prim Dressing: KerraCel Ag Gelling Fiber Dressing, 4x5 in (silver alginate) 1 x Per Week ary Discharge Instructions: Apply silver alginate to wound bed as instructed Secondary Dressing: ABD Pad, 8x10 1 x Per Week Discharge Instructions: Apply over primary dressing as directed. Secondary Dressing: Woven Gauze Sponge, Non-Sterile 4x4 in 1 x Per Week Discharge Instructions: Apply over primary dressing as directed. Compression Wrap: ThreePress (3 layer compression wrap) 1 x Per Week Discharge Instructions: Apply three layer compression as directed. Wound #4 - Foot Wound Laterality: Right, Medial Peri-Wound Care: Sween Lotion (Moisturizing lotion) 1 x Per Week Discharge Instructions: Apply moisturizing lotion as directed Topical: Gentamicin 1 x Per Week Discharge Instructions: As directed by physician Prim Dressing: KerraCel Ag Gelling Fiber Dressing, 4x5 in (silver alginate) 1 x Per Week ary Discharge Instructions: Apply silver alginate to wound bed as instructed Secondary Dressing: ABD Pad, 8x10 1 x  Per Week Discharge Instructions: Apply over primary dressing as directed. Secondary Dressing: Woven Gauze Sponge, Non-Sterile 4x4 in 1 x Per Week Discharge Instructions: Apply over primary dressing as directed. Compression Wrap: ThreePress (3 layer compression wrap) 1 x Per Week Discharge Instructions: Apply three layer compression as directed. Laboratory naerobe culture (MICRO) - right leg Bacteria identified in Unspecified specimen by A LOINC Code: 923-3 Convenience Name: Anaerobic culture Radiology X-ray, tib/fib right - nonhealing ulcer right lower leg CPT 73590 - (ICD10 L97.812 - 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d) Electronic Signature(s) Signed: 02/17/2022 12:38:36 PM By: Fredirick Maudlin MD FACS Entered By: Fredirick Maudlin on 02/17/2022 12:19:19 Prescription 02/17/2022 -------------------------------------------------------------------------------- Zadie Rhine MD Patient Name: Provider: 1951-07-05 [id number] Date of Birth: NPI#Jerilynn Mages [id number] Sex: DEA #: [phone] Phone #: License #: [place] Patient Address: [address], [place] [address] [address], [place] [address] [phone] Allergies No Known Allergies Provider's Orders X-ray, tib/fib right - ICD10: T36.468 - nonhealing ulcer right lower leg CPT 73590 Hand Signature: Date(s): Electronic Signature(s) Signed: 02/17/2022 12:21:33 PM By: Fredirick Maudlin MD FACS Entered By: Fredirick Maudlin on 02/17/2022 12:21:32 -------------------------------------------------------------------------------- Problem List Details Patient Name: Date of Service: [name] 02/17/2022 10:30 A M Medical Record Number: [id number] Patient Account  Number: [id number] Date of Birth/Sex: Treating RN: 28-Aug-1950 (71 y.o. [name] Primary Care Provider: PA [name], [place] Other  Clinician: Referring Provider: Treating Provider/Extender: [name] in Treatment: 1 Active Problems ICD-10 Encounter Code Description Active Date MDM Diagnosis L97.812 Non-pressure chronic ulcer of other part of right lower leg with fat layer 02/09/2022 No Yes exposed L97.522 Non-pressure chronic ulcer of other part of left foot with fat layer exposed 02/09/2022 No Yes 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) 02/09/2022 No Yes I10 Essential (primary) hypertension 02/09/2022 No Yes I25.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 02/09/2022 No Yes I89.0 Lymphedema, not elsewhere classified 02/09/2022 No Yes Z86.718 Personal history of other venous thrombosis and embolism 02/09/2022 No Yes L97.512 Non-pressure chronic ulcer of other part of right foot with fat layer exposed 02/17/2022 No Yes Inactive Problems Resolved Problems Electronic Signature(s) Signed: 02/17/2022 12:06:06 PM By: Fredirick Maudlin MD FACS Entered By: Fredirick Maudlin on 02/17/2022 12:06:06 -------------------------------------------------------------------------------- Progress Note Details Patient Name: Date of Service: [name] 02/17/2022 10:30 A M Medical Record Number: [id number] Patient Account Number: [id number] Date of Birth/Sex: Treating RN: April 03, 1951 (71 y.o. [name] Primary Care Provider: PA [name], [place] Other Clinician: Referring Provider: Treating Provider/Extender: [name] in Treatment: 1 Subjective Chief Complaint Information obtained from Patient Patient seen for complaints of Non-Healing Wounds. History of Present Illness (HPI) ADMISSION 02/09/2022 This is a 71 year old man who recently moved to [place] from [place]. He has a history of of coronary artery disease and prior left popliteal vein DVT . He is not diabetic and does not smoke. 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 He has had multiple spinal surgeries and has limited mobility. He primarily uses a motorized wheelchair. He has had lymphedema for many years and does have lymphedema pumps although he says he does not use them frequently. He has wounds on his bilateral lower extremities. He was receiving care in [place] for these prior to his move. They were apparently packing his wounds with Betadine soaked gauze. He has worn compression wraps in the past but not recently. He did say that they helped tremendously. In clinic today, his right ABI is noncompressible but has good signals; the left ABI was 1.17. No formal vascular studies have been performed. On his left dorsal foot, there is a circular lesion that exposes the fat layer. There is fairly heavy slough accumulation within the wound, but no significant odor. On his right medial calf, he has multiple pinholes that have slough buildup in them and probe for about 2 to 4 mm in depth. They are draining clear fluid. On his right lateral midfoot, he has ulceration consistent with venous stasis. It is geographic and has slough accumulation. 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 02/17/2022: All of the wounds are little bit larger today and have more slough accumulation. Today, the medial calf openings are larger and probe to something that feels calcified. There is odor coming from his wounds. His vascular studies are scheduled for August 9 and 10. Patient History Information obtained from Patient, Chart. Family History Cancer - Mother, Heart Disease - Father,Mother, Hypertension - Father,Siblings, Lung Disease - Siblings, No family history of Diabetes, Hereditary Spherocytosis, Kidney Disease, Seizures, Stroke, Thyroid Problems, Tuberculosis. Social History Former smoker - quit 1991, Marital Status - Married, Alcohol Use - Moderate, Drug Use - No  History,  Caffeine Use - Daily - coffee. Medical History Eyes Patient has history of Cataracts - right eye removed Denies history of Glaucoma, Optic Neuritis Ear/Nose/Mouth/Throat Denies history of Chronic sinus problems/congestion, Middle ear problems Hematologic/Lymphatic Patient has history of Anemia - macrocytic, Lymphedema Cardiovascular Patient has history of Congestive Heart Failure, Deep Vein Thrombosis - left leg, Hypertension, Peripheral Venous Disease Endocrine Denies history of Type I Diabetes, Type II Diabetes Genitourinary Denies history of End Stage Renal Disease Integumentary (Skin) Denies history of History of Burn Oncologic Denies history of Received Chemotherapy, Received Radiation Psychiatric Denies history of Anorexia/bulimia, Confinement Anxiety Hospitalization/Surgery History - cervical fusion. - lumbar surgery. - coronary stent placement. - lasik eye surgery. - hydrocele excision. Medical A Surgical History Notes nd Constitutional Symptoms (General Health) obesity Ear/Nose/Mouth/Throat hard of hearing Respiratory frozen diaphragm right Cardiovascular hyperlipidemia Musculoskeletal myositis, lumbar DDD, spinal stenosis, cervical facet joint syndrome Objective Constitutional Slightly hypertensive. No acute distress.. Vitals Time Taken: 10:39 AM, Temperature: 97.9 F, Pulse: 66 bpm, Respiratory Rate: 18 breaths/min, Blood Pressure: 143/70 mmHg. Respiratory Normal work of breathing on room air.. General Notes: 02/17/2022: All of the wounds are little bit larger today and have more slough accumulation. Today, the medial calf openings are larger and probe to something that feels calcified. There is odor coming from his wounds. Integumentary (Hair, Skin) Wound #1 status is Open. Original cause of wound was Gradually Appeared. The date acquired was: 12/07/2021. The wound has been in treatment 1 weeks. The wound is located on the Left,Dorsal Foot. The wound measures  2.6cm length x 2.5cm width x 0.6cm depth; 5.105cm^2 area and 3.063cm^3 volume. There is Fat Layer (Subcutaneous Tissue) exposed. There is no tunneling or undermining noted. There is a large amount of serosanguineous drainage noted. The wound margin is well defined and not attached to the wound base. There is medium (34-66%) red, pink granulation within the wound bed. There is a medium (34-66%) amount of necrotic tissue within the wound bed including Adherent Slough. Wound #2 status is Open. Original cause of wound was Gradually Appeared. The date acquired was: 11/02/2021. The wound has been in treatment 1 weeks. The wound is located on the Right,Lateral Foot. The wound measures 2.2cm length x 1cm width x 0.1cm depth; 1.728cm^2 area and 0.173cm^3 volume. There is Fat Layer (Subcutaneous Tissue) exposed. There is no tunneling or undermining noted. There is a medium amount of serosanguineous drainage noted. The wound margin is flat and intact. There is small (1-33%) pink granulation within the wound bed. There is a large (67-100%) amount of necrotic tissue within the wound bed including Adherent Slough. Wound #3 status is Open. Original cause of wound was Gradually Appeared. The date acquired was: 12/07/2021. The wound has been in treatment 1 weeks. The wound is located on the Right,Medial Lower Leg. The wound measures 2.1cm length x 3.3cm width x 1cm depth; 5.443cm^2 area and 5.443cm^3 volume. There is bone and Fat Layer (Subcutaneous Tissue) exposed. There is no tunneling noted, however, there is undermining starting at 9:00 and ending at 10:00 with a maximum distance of 8cm. There is a medium amount of serosanguineous drainage noted. The wound margin is distinct with the outline attached to the wound base. There is small (1-33%) red granulation within the wound bed. There is a large (67-100%) amount of necrotic tissue within the wound bed including Eschar and Adherent Slough. Wound #4 status is Open.  Original cause of wound was Gradually Appeared. The date acquired was: 02/17/2022. The wound is located  on the Right,Medial Foot. The wound measures 0.8cm length x 0.5cm width x 0.1cm depth; 0.314cm^2 area and 0.031cm^3 volume. There is Fat Layer (Subcutaneous Tissue) exposed. There is no tunneling or undermining noted. There is a medium amount of serosanguineous drainage noted. The wound margin is flat and intact. There is medium (34-66%) red granulation within the wound bed. There is a medium (34-66%) amount of necrotic tissue within the wound bed including Adherent Slough. 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 There was a Excisional Skin/Subcutaneous Tissue Debridement with a total area of 6.76 sq cm performed by Fredirick Maudlin, MD. With the following instrument(s): Curette to remove Viable and Non-Viable tissue/material. Material removed includes Subcutaneous Tissue and Slough and after achieving pain control using Lidocaine 4% T opical Solution. 1 specimen was taken by a Tissue Culture and sent to the lab per facility protocol. A time out was conducted at 11:30, prior to the start of the procedure. A Minimum amount of bleeding was controlled with Pressure. The procedure was tolerated well with a pain level of 2 throughout and a pain level of 0 following the procedure. Post Debridement Measurements: 2.6cm  length x 2.6cm width x 0.6cm depth; 3.186cm^3 volume. Character of Wound/Ulcer Post Debridement requires further debridement. Post procedure Diagnosis Wound #1: Same as Pre-Procedure Pre-procedure diagnosis of Wound #1 is a Lymphedema located on the Left,Dorsal Foot . There was a Three Layer Compression Therapy Procedure by Baruch Gouty, RN. Post procedure Diagnosis Wound #1: Same as Pre-Procedure Wound #2 Pre-procedure diagnosis of Wound #2 is a Lymphedema located on the Right,Lateral Foot . There was a Excisional Skin/Subcutaneous Tissue Debridement with a total area of 2.2 sq cm performed by Fredirick Maudlin, MD. With the following instrument(s): Curette to remove Viable and Non-Viable tissue/material. Material removed includes Subcutaneous Tissue and Slough and after achieving pain control using Lidocaine 4% T opical Solution. No specimens were taken. A time out was conducted at 11:30, prior to the start of the procedure. A Minimum amount of bleeding was controlled with Pressure. The procedure was tolerated well with a pain level of 2 throughout and a pain level of 0 following the procedure. Post Debridement Measurements: 2.2cm length x 1cm width x 0.1cm depth; 0.173cm^3 volume. Character of Wound/Ulcer Post Debridement requires further debridement. Post procedure Diagnosis Wound #2: Same as Pre-Procedure Wound #3 Pre-procedure diagnosis of Wound #3 is a Lymphedema located on the Right,Medial Lower Leg . There was a Excisional Skin/Subcutaneous Tissue Debridement with a total area of 3 sq cm performed by Fredirick Maudlin, MD. With the following instrument(s): Curette to remove Viable and Non-Viable tissue/material. Material removed includes Subcutaneous Tissue and Slough and after achieving pain control using Lidocaine 4% T opical Solution. 1 specimen was taken by a Tissue Culture and sent to the lab per facility protocol. A time out was conducted at 11:30, prior to the start of the  procedure. A Minimum amount of bleeding was controlled with Pressure. The procedure was tolerated well with a pain level of 2 throughout and  a pain level of 0 following the procedure. Post Debridement Measurements: 2.1cm length x 3.3cm width x 1cm depth; 5.443cm^3 volume. Character of Wound/Ulcer Post Debridement requires further debridement. Post procedure Diagnosis Wound #3: Same as Pre-Procedure Pre-procedure diagnosis of Wound #3 is a Lymphedema located on the Right,Medial Lower Leg . There was a Three Layer Compression Therapy Procedure by Baruch Gouty, RN. Post procedure Diagnosis Wound #3: Same as Pre-Procedure Wound #4 Pre-procedure diagnosis of Wound #4 is a Lymphedema located on the Right,Medial Foot . There was a Selective/Open Wound Non-Viable Tissue Debridement with a total area of 0.4 sq cm performed by Fredirick Maudlin, MD. With the following instrument(s): Curette to remove Non-Viable tissue/material. Material removed includes [facility] after achieving pain control using Lidocaine 4% T opical Solution. 1 specimen was taken by a Tissue Culture and sent to the lab per facility protocol. A time out was conducted at 11:30, prior to the start of the procedure. A Minimum amount of bleeding was controlled with Pressure. The procedure was tolerated well with a pain level of 2 throughout and a pain level of 0 following the procedure. Post Debridement Measurements: 0.8cm length x 0.5cm width x 0.1cm depth; 0.031cm^3 volume. Character of Wound/Ulcer Post Debridement is improved. Post procedure Diagnosis Wound #4: Same as Pre-Procedure Plan Follow-up Appointments: Return Appointment in 1 week. - Dr.Hoffman (for Dr. [name]) Rm 2 Wed 8/2 @ 10:00 am Return Appointment in 2 weeks. - Dr. [name] Rm 1 Wed. 8/9 @ 10:30 am Bathing/ Shower/ Hygiene: May shower with protection but do not get wound dressing(s) wet. Edema Control - Lymphedema / SCD / Other: Lymphedema Pumps. Use Lymphedema pumps on  leg(s) 2-3 times a day for 45-60 minutes. If wearing any wraps or hose, do not remove them. Continue exercising as instructed. - use 1-2 times per day over wraps Elevate legs to the level of the heart or above for 30 minutes daily and/or when sitting, a frequency of: Exercise regularly Laboratory ordered were: Anaerobic culture - right leg Radiology ordered were: X-ray, tib/fib right - nonhealing ulcer right lower leg CPT 73590 WOUND #1: - Foot Wound Laterality: Dorsal, Left Peri-Wound Care: Sween Lotion (Moisturizing lotion) 1 x Per Week/ Discharge Instructions: Apply moisturizing lotion as directed Topical: Gentamicin 1 x Per Week/ Discharge Instructions: As directed by physician Prim Dressing: KerraCel Ag Gelling Fiber Dressing, 4x5 in (silver alginate) 1 x Per Week/ ary Discharge Instructions: Apply silver alginate to wound bed as instructed Secondary Dressing: ABD Pad, 8x10 1 x Per Week/ Discharge Instructions: Apply over primary dressing as directed. Secondary Dressing: Woven Gauze Sponge, Non-Sterile 4x4 in 1 x Per Week/ Discharge Instructions: Apply over primary dressing as directed. Com pression Wrap: ThreePress (3 layer compression wrap) 1 x Per Week/ Discharge Instructions: Apply three layer compression as directed. WOUND #2: - Foot Wound Laterality: Right, Lateral Peri-Wound Care: Sween Lotion (Moisturizing lotion) 1 x Per Week/ Discharge Instructions: Apply moisturizing lotion as directed Topical: Gentamicin 1 x Per Week/ Discharge Instructions: As directed by physician Prim Dressing: KerraCel Ag Gelling Fiber Dressing, 4x5 in (silver alginate) 1 x Per Week/ ary Discharge Instructions: Apply silver alginate to wound bed as instructed Secondary Dressing: ABD Pad, 8x10 1 x Per Week/ Discharge Instructions: Apply over primary dressing as directed. Secondary Dressing: Woven Gauze Sponge, Non-Sterile 4x4 in 1 x Per Week/ Discharge Instructions: Apply over primary dressing as  directed. Com pression Wrap: ThreePress (3 layer compression wrap) 1 x Per Week/ Discharge Instructions: Apply three layer compression as directed. WOUND #  3: - Lower Leg Wound Laterality: Right, Medial Peri-Wound Care: Sween Lotion (Moisturizing lotion) 1 x Per Week/ Discharge Instructions: Apply moisturizing lotion as directed Topical: Gentamicin 1 x Per Week/ Discharge Instructions: As directed by physician Prim Dressing: KerraCel Ag Gelling Fiber Dressing, 4x5 in (silver alginate) 1 x Per Week/ ary Discharge Instructions: Apply silver alginate to wound bed as instructed Secondary Dressing: ABD Pad, 8x10 1 x Per Week/ Discharge Instructions: Apply over primary dressing as directed. Secondary Dressing: Woven Gauze Sponge, Non-Sterile 4x4 in 1 x Per Week/ Discharge Instructions: Apply over primary dressing as directed. Com pression Wrap: ThreePress (3 layer compression wrap) 1 x Per Week/ Discharge Instructions: Apply three layer compression as directed. WOUND #4: - Foot Wound Laterality: Right, Medial Peri-Wound Care: Sween Lotion (Moisturizing lotion) 1 x Per Week/ Discharge Instructions: Apply moisturizing lotion as directed Topical: Gentamicin 1 x Per Week/ Discharge Instructions: As directed by physician Prim Dressing: KerraCel Ag Gelling Fiber Dressing, 4x5 in (silver alginate) 1 x Per Week/ ary Discharge Instructions: Apply silver alginate to wound bed as instructed Secondary Dressing: ABD Pad, 8x10 1 x Per Week/ Discharge Instructions: Apply over primary dressing as directed. Secondary Dressing: Woven Gauze Sponge, Non-Sterile 4x4 in 1 x Per Week/ Discharge Instructions: Apply over primary dressing as directed. Com pression Wrap: ThreePress (3 layer compression wrap) 1 x Per Week/ Discharge Instructions: Apply three layer compression as directed. 02/17/2022: All of the wounds are little bit larger today and have more slough accumulation. Today, the medial calf openings are  larger and probe to something that feels calcified. There is odor coming from his wounds. I used a curette to debride slough and nonviable subcutaneous tissue from each of his wounds. Due to the increase in size and odor, I took a culture. 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 I am going to add gentamicin to his dressings in addition to the silver alginate and 3 layer compression. Once his culture data are available, I will make any adjustments in his therapy as indicated. He will follow-up next week with my colleague and then I will see him in 2 weeks. Electronic Signature(s) Signed: 02/17/2022 12:20:58 PM By: Fredirick Maudlin MD FACS Entered By: Fredirick Maudlin on 02/17/2022 12:20:58 -------------------------------------------------------------------------------- HxROS Details Patient Name: Date of Service: [name] 02/17/2022 10:30 A M Medical Record Number: [id number] Patient Account Number: [id number] Date of Birth/Sex: Treating RN: 1950-10-30 (71 y.o. [name] Primary Care Provider: PA [name], [place] Other Clinician: Referring Provider: Treating Provider/Extender: [name] in Treatment: 1 Information Obtained From Patient Chart Constitutional Symptoms (General Health) Medical History: Past Medical History Notes: obesity Eyes Medical History: Positive for: Cataracts - right eye removed Negative for: Glaucoma; Optic Neuritis Ear/Nose/Mouth/Throat Medical History: Negative for: Chronic sinus problems/congestion; Middle ear problems Past Medical History Notes: hard of hearing Hematologic/Lymphatic Medical History: Positive for: Anemia - macrocytic; Lymphedema Respiratory Medical History: Past Medical History Notes: frozen diaphragm right Cardiovascular Medical History: Positive for: Congestive Heart Failure; Deep Vein Thrombosis - left leg; Hypertension; Peripheral Venous  Disease Past Medical History Notes: hyperlipidemia Endocrine Medical History: Negative for: Type I Diabetes; Type II Diabetes Genitourinary Medical History: Negative for: End Stage Renal Disease Integumentary (Skin) Medical History: Negative for: History of Burn Musculoskeletal Medical History: Past Medical History Notes: myositis, lumbar DDD, spinal stenosis, cervical facet joint syndrome Oncologic Medical History: Negative for: Received Chemotherapy; Received Radiation Psychiatric Medical History: Negative  for: Anorexia/bulimia; Confinement Anxiety HBO Extended History Items Eyes: Cataracts Immunizations Pneumococcal Vaccine: Received Pneumococcal Vaccination: Yes Received Pneumococcal Vaccination On or After 60th Birthday: Yes Implantable Devices No devices added Hospitalization / Surgery History Type of Hospitalization/Surgery cervical fusion lumbar surgery coronary stent placement lasik eye surgery hydrocele excision Family and Social History Cancer: Yes - Mother; Diabetes: No; Heart Disease: Yes - Father,Mother; Hereditary Spherocytosis: No; Hypertension: Yes - Father,Siblings; Kidney Disease: No; Lung Disease: Yes - Siblings; Seizures: No; Stroke: No; Thyroid Problems: No; Tuberculosis: No; Former smoker - quit 1991; Marital Status - Married; Alcohol Use: Moderate; Drug Use: No History; Caffeine Use: Daily - coffee; Financial Concerns: No; Food, Clothing or Shelter Needs: No; Support System Lacking: No; Transportation Concerns: No Engineer, maintenance) Signed: 02/17/2022 12:38:36 PM By: Fredirick Maudlin MD FACS Signed: 02/17/2022 2:57:53 PM By: Baruch Gouty RN, BSN Entered By: Fredirick Maudlin on 02/17/2022 12:15:45 -------------------------------------------------------------------------------- [place] Details Patient Name: Date of Service: EULICE, RUTLEDGE 02/17/2022 Medical Record Number: [id number] Patient Account Number: [id number] Date of  Birth/Sex: Treating RN: 02/27/1951 (71 y.o. [name] Primary Care Provider: PA [name], [place] Other Clinician: Referring Provider: Treating Provider/Extender: [name] in Treatment: 1 Diagnosis Coding ICD-10 Codes Code Description [phone] 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d L97.522 Non-pressure chronic ulcer of other part of left foot with fat layer exposed 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) I10 Essential (primary) hypertension I25.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 I89.0 Lymphedema, not elsewhere classified Z86.718 Personal 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 Facility Procedures CPT4 Code: [id number] Description: [place] - DEB SUBQ TISSUE 20 SQ CM/< ICD-10 Diagnosis Description 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 L97.522 Non-pressure chronic ulcer of other part of left foot with fat layer exposed Modifier: ed Quantity: 1 Physician Procedures : CPT4 Code Description Modifier [id number] - WC PHYS LEVEL 4 - EST PT 25 ICD-10 Diagnosis Description 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d L97.522 Non-pressure chronic ulcer of other part of left  foot with fat layer exposed L97.512 Non-pressure chronic ulcer of other part of right foot with fat layer exposed 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) Quantity: 1 : [id number] 11042 - WC PHYS SUBQ TISS 20 SQ CM ICD-10 Diagnosis Description 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d L97.522 Non-pressure chronic ulcer of other part of left foot with fat layer exposed Quantity: 1 : [id number] 11552 - WC PHYS DEBR WO ANESTH 20 SQ CM ICD-10 Diagnosis Description  L97.512 Non-pressure chronic ulcer of other part of right foot with fat layer exposed Quantity: 1 Electronic Signature(s) Signed: 02/17/2022 12:21:29 PM By: Fredirick Maudlin MD FACS Entered By: Fredirick Maudlin on 02/17/2022 12:21:28

## 2022-02-17 NOTE — Progress Notes (Addendum)
[name], [name] ([id number]) Visit Report for 02/09/2022 Allergy List Details Patient Name: Date of Service: [name], [name] 02/09/2022 9:00 A M Medical Record Number: [id number] Patient Account Number: [id number] Date of Birth/Sex: Treating RN: [birth year] (71 y.o. [name] Primary Care [place] Kuang: PA [name], [place] Other Clinician: Referring Desere Gwin: Treating Jaquel Coomer/Extender: Valeria Batman Weeks in Treatment: 0 Allergies Active Allergies No Known Allergies Allergy Notes Electronic Signature(s) Signed: 02/17/2022 5:39:28 PM By: Donavan Burnet CHT EMT BS , , Entered By: Donavan Burnet on 02/09/2022 09:42:49 -------------------------------------------------------------------------------- Arrival Information Details Patient Name: Date of Service: [name] 02/09/2022 9:00 A M Medical Record Number: [id number] Patient Account Number: [id number] Date of Birth/Sex: Treating RN: [birth year] (71 y.o. [name] Primary Care Kaoru Benda: PA [name], [place] Other Clinician: Donavan Burnet Referring Majid Mccravy: Treating Kymorah Korf/Extender: [name] in Treatment: 0 Visit Information Patient Arrived: Wheel Chair Arrival Time: 09:32 Accompanied By: self Transfer Assistance: None Patient Identification Verified: Yes Secondary Verification Process Completed: Yes Electronic Signature(s) Signed: 02/17/2022 5:39:28 PM By: Donavan Burnet CHT EMT BS , , Entered By: Donavan Burnet on 02/09/2022 09:38:44 -------------------------------------------------------------------------------- Clinic Level of Care Assessment Details Patient Name: Date of Service: [name] 02/09/2022 9:00 A M Medical Record Number: [id number] Patient Account Number: [id number] Date of Birth/Sex: Treating RN: [birth year] (71 y.o. [name] Primary Care [name]: PA Darnelle Spangle Other Clinician: Referring Rolf Fells: Treating Damyen Knoll/Extender: [name] in Treatment: 0 Clinic Level of Care Assessment Items TOOL 1 Quantity Score X- 1 0 Use when EandM and Procedure is performed on INITIAL visit ASSESSMENTS - Nursing Assessment / Reassessment X- 1 20 General Physical Exam (combine w/ comprehensive assessment (listed just below) when performed on new pt. evals) X- 1 25 Comprehensive Assessment (HX, ROS, Risk Assessments, Wounds Hx, etc.) ASSESSMENTS - Wound and Skin Assessment / Reassessment X- 1 10 Dermatologic / Skin Assessment (not related to wound area) ASSESSMENTS - Ostomy and/or Continence Assessment and Care '[]'$  - 0 Incontinence Assessment and Management '[]'$  - 0 Ostomy Care Assessment and Management (repouching, etc.) PROCESS - Coordination of Care '[]'$  - 0 Simple Patient / Family Education for ongoing care X- 1 20 Complex (extensive) Patient / Family Education for ongoing care X- 1 10 Staff obtains Programmer, systems, Records, T Results / Process Orders est '[]'$  - 0 Staff telephones HHA, Nursing Homes / Clarify orders / etc '[]'$  - 0 Routine Transfer to another Facility (non-emergent condition) '[]'$  - 0 Routine Hospital Admission (non-emergent condition) X- 1 15 New Admissions / Biomedical engineer / Ordering NPWT Apligraf, etc. , '[]'$  - 0 Emergency Hospital Admission (emergent condition) PROCESS - Special Needs '[]'$  - 0 Pediatric / Minor Patient Management '[]'$  - 0 Isolation Patient Management '[]'$  - 0 Hearing / Language / Visual special needs '[]'$  - 0 Assessment of Community assistance (transportation, D/C planning, etc.) '[]'$  - 0 Additional assistance / Altered mentation '[]'$  - 0 Support Surface(s) Assessment (bed, cushion, seat, etc.) INTERVENTIONS - Miscellaneous '[]'$  - 0 External ear exam '[]'$  - 0 Patient Transfer (multiple staff / Civil Service fast streamer / Similar devices) '[]'$  - 0 Simple Staple / Suture removal (25 or less) '[]'$  - 0 Complex Staple / Suture removal (26 or more) '[]'$  - 0 Hypo/Hyperglycemic Management  (do not check if billed separately) X- 1 15 Ankle / Brachial Index (ABI) - do not check if billed separately Has the patient been seen at the hospital within the last three years: Yes Total Score: 115 Level Of Care: New/Established -  Level 3 Electronic Signature(s) Signed: 02/20/2022 12:37:29 PM By: Deon Pilling RN, BSN Entered By: Deon Pilling on 02/20/2022 10:36:05 -------------------------------------------------------------------------------- Lower Extremity Assessment Details Patient Name: Date of Service: [name], [name] 02/09/2022 9:00 A M Medical Record Number: [id number] Patient Account Number: [id number] Date of Birth/Sex: Treating RN: 1950-11-05 (71 y.o. [name] Primary Care Tim Wilhide: PA [name], [place] Other Clinician: Referring Analea Muller: Treating Cece Milhouse/Extender: Valeria Batman Weeks in Treatment: 0 Edema Assessment Assessed: [Left: No] [Right: No] Edema: [Left: Yes] [Right: Yes] Calf Left: Right: Point of Measurement: From Medial Instep 44.5 cm 48.5 cm Ankle Left: Right: Point of Measurement: From Medial Instep 25 cm 27 cm Knee To Floor Left: Right: From Medial Instep 45 cm 45 cm Vascular Assessment Pulses: Dorsalis Pedis Palpable: [Left:Yes] [Right:Yes] Blood Pressure: Brachial: [Left:145] [Right:145] Ankle: [Left:Posterior Tibial: 170 1.17] Notes Right and Left DP pulses noncompressible Electronic Signature(s) Signed: 02/09/2022 5:31:58 PM By: Baruch Gouty RN, BSN Signed: 02/17/2022 5:39:28 PM By: Donavan Burnet CHT EMT BS , , Entered By: Donavan Burnet on 02/09/2022 10:38:48 -------------------------------------------------------------------------------- Multi Wound Chart Details Patient Name: Date of Service: [name] 02/09/2022 9:00 A M Medical Record Number: [id number] Patient Account Number: [id number] Date of Birth/Sex: Treating RN: 05-22-1951 (71 y.o. [name] Primary Care [name]: PA [name],  [place] Other Clinician: Referring Jovon Winterhalter: Treating Zanna Hawn/Extender: [name] in Treatment: 0 Vital Signs Height(in): Pulse(bpm): 25 Weight(lbs): Blood Pressure(mmHg): 145/80 Body Mass Index(BMI): Temperature(F): 98.4 Respiratory Rate(breaths/min): 18 Photos: [1:Left, Dorsal Foot] [2:Right, Lateral Foot] [3:Right, Medial Lower Leg] Wound Location: [1:Gradually Appeared] [2:Gradually Appeared] [3:Gradually Appeared] Wounding Event: [1:Lymphedema] [2:Lymphedema] [3:Lymphedema] Primary Etiology: [1:Cataracts, Anemia, Lymphedema,] [2:Cataracts, Anemia, Lymphedema,] [3:Cataracts, Anemia, Lymphedema,] Comorbid History: [1:Congestive Heart Failure, Deep Vein Congestive Heart Failure, Deep Vein Congestive Heart Failure, Deep Vein Thrombosis, Hypertension, Peripheral Thrombosis, Hypertension, Peripheral Thrombosis, Hypertension, Peripheral Venous Disease  12/07/2021] [2:Venous Disease 11/02/2021] [3:Venous Disease 12/07/2021] Date Acquired: [1:0] [2:0] [3:0] Weeks of Treatment: [1:Open] [2:Open] [3:Open] Wound Status: [1:No] [2:No] [3:No] Wound Recurrence: [1:2.3x2.1x0.4] [2:4x1.3x0.1] [3:2x3.4x0.4] Measurements L x W x D (cm) [1:3.793] [2:4.084] [3:5.341] A (cm) : rea [1:1.517] [2:0.408] [3:2.136] Volume (cm) : [1:0.00%] [2:0.00%] [3:0.00%] % Reduction in A [1:rea: 0.00%] [2:0.00%] [3:0.00%] % Reduction in Volume: [1:4] Starting Position 1 (o'clock): [1:10] Ending Position 1 (o'clock): [1:0.7] Maximum Distance 1 (cm): [1:Yes] [2:No] [3:No] Undermining: [1:Full Thickness Without Exposed] [2:Full Thickness Without Exposed] [3:Full Thickness Without Exposed] Classification: [1:Support Structures Large] [2:Support Structures Medium] [3:Support Structures Medium] Exudate A mount: [1:Serous] [2:Serous] [3:Serous] Exudate Type: [1:amber] [2:amber] [3:amber] Exudate Color: [1:Well defined, not attached] [2:Flat and Intact] [3:Indistinct, nonvisible] Wound Margin:  [1:Small (1-33%)] [2:None Present (0%)] [3:None Present (0%)] Granulation A mount: [1:Pink] [2:N/A] [3:N/A] Granulation Quality: [1:Large (67-100%)] [2:Large (67-100%)] [3:Large (67-100%)] Necrotic A mount: [1:Fat Layer (Subcutaneous Tissue): Yes Fat Layer (Subcutaneous Tissue): Yes Fat Layer (Subcutaneous Tissue): Yes] Exposed Structures: [1:Fascia: No Tendon: No Muscle: No Joint: No Bone: No None] [2:Fascia: No Tendon: No Muscle: No Joint: No Bone: No Medium (34-66%)] [3:Fascia: No Tendon: No Muscle: No Joint: No Bone: No Medium (34-66%)] Epithelialization: [1:Debridement - Excisional] [2:Debridement - Selective/Open Wound Debridement - Selective/Open Wound] Debridement: Pre-procedure Verification/Time Out 10:45 [2:10:45] [3:10:45] Taken: [1:Lidocaine 4% Topical Solution] [2:Lidocaine 4% Topical Solution] [3:Lidocaine 4% Topical Solution] Pain Control: [1:Subcutaneous, Slough] [2:Slough] [3:Slough] Tissue Debrided: [1:Skin/Subcutaneous Tissue] [2:Non-Viable Tissue] [3:Non-Viable Tissue] Level: [1:4.83] [2:5.2] [3:1] Debridement A (sq cm): [1:rea Curette] [2:Curette] [3:Curette] Instrument: [1:Minimum] [2:Minimum] [3:Minimum] Bleeding: [1:Pressure] [2:Pressure] [3:Pressure] Hemostasis A chieved: [1:4] [2:4] [3:4] Procedural Pain: [1:2] [  2:2] [3:2] Post Procedural Pain: [1:Procedure was tolerated well] [2:Procedure was tolerated well] [3:Procedure was tolerated well] Debridement Treatment Response: [1:2.3x2.1x0.4] [2:4x1.3x0.1] [3:2x3.4x0.4] Post Debridement Measurements L x W x D (cm) [1:1.517] [2:0.408] [3:2.136] Post Debridement Volume: (cm) [1:Debridement] [2:Debridement] [3:Debridement] Treatment Notes Electronic Signature(s) Signed: 02/09/2022 10:59:35 AM By: Fredirick Maudlin MD FACS Signed: 02/09/2022 5:31:58 PM By: Baruch Gouty RN, BSN Entered By: Fredirick Maudlin on 02/09/2022 10:59:35 -------------------------------------------------------------------------------- Pain  Assessment Details Patient Name: Date of Service: [name], [name] 02/09/2022 9:00 A M Medical Record Number: [id number] Patient Account Number: [id number] Date of Birth/Sex: Treating RN: 28-Dec-1950 (71 y.o. [name] Primary Care Cailen Texeira: PA [name], [place] Other Clinician: Referring Colleen Donahoe: Treating Leocadio Heal/Extender: [name] in Treatment: 0 Active Problems Location of Pain Severity and Description of Pain Patient Has Paino Yes Site Locations Pain Location: Pain in Ulcers With Dressing Change: Yes Duration of the Pain. Constant / Intermittento Intermittent Rate the pain. Current Pain Level: 0 Worst Pain Level: 7 Least Pain Level: 0 Character of Pain Describe the Pain: Aching Pain Management and Medication Current Pain Management: Medication: Yes Is the Current Pain Management Adequate: Adequate How does your wound impact your activities of daily livingo Sleep: No Bathing: No Appetite: No Relationship With Others: No Bladder Continence: No Emotions: No Bowel Continence: No Work: No Toileting: No Drive: No Dressing: No Hobbies: No Electronic Signature(s) Signed: 02/09/2022 5:31:58 PM By: Baruch Gouty RN, BSN Signed: 02/17/2022 5:39:28 PM By: Donavan Burnet CHT EMT BS , , Entered By: Donavan Burnet on 02/09/2022 10:28:07 -------------------------------------------------------------------------------- Wound Assessment Details Patient Name: Date of Service: [name] 02/09/2022 9:00 A M Medical Record Number: [id number] Patient Account Number: [id number] Date of Birth/Sex: Treating RN: 09-Sep-1950 (71 y.o. [name] Primary Care Josclyn Rosales: PA [name], [place] Other Clinician: Referring Sultan Pargas: Treating Dekendrick Uzelac/Extender: Valeria Batman Weeks in Treatment: 0 Wound Status Wound Number: 1 Primary Lymphedema Etiology: Wound Location: Left, Dorsal Foot Wound Open Wounding Event: Gradually  Appeared Status: Date Acquired: 12/07/2021 Comorbid Cataracts, Anemia, Lymphedema, Congestive Heart Failure, Deep Weeks Of Treatment: 0 History: Vein Thrombosis, Hypertension, Peripheral Venous Disease Clustered Wound: No Photos Wound Measurements Length: (cm) 2.3 Width: (cm) 2.1 Depth: (cm) 0.4 Area: (cm) 3.793 Volume: (cm) 1.517 % Reduction in Area: 0% % Reduction in Volume: 0% Epithelialization: None Tunneling: No Undermining: Yes Starting Position (o'clock): 4 Ending Position (o'clock): 10 Maximum Distance: (cm) 0.7 Wound Description Classification: Full Thickness Without Exposed Support Structures Wound Margin: Well defined, not attached Exudate Amount: Large Exudate Type: Serous Exudate Color: amber Foul Odor After Cleansing: No Slough/Fibrino Yes Wound Bed Granulation Amount: Small (1-33%) Exposed Structure Granulation Quality: Pink Fascia Exposed: No Necrotic Amount: Large (67-100%) Fat Layer (Subcutaneous Tissue) Exposed: Yes Necrotic Quality: Adherent Slough Tendon Exposed: No Muscle Exposed: No Joint Exposed: No Bone Exposed: No Electronic Signature(s) Signed: 02/09/2022 5:31:58 PM By: Baruch Gouty RN, BSN Entered By: Baruch Gouty on 02/09/2022 10:58:00 -------------------------------------------------------------------------------- Wound Assessment Details Patient Name: Date of Service: [name] 02/09/2022 9:00 A M Medical Record Number: [id number] Patient Account Number: [id number] Date of Birth/Sex: Treating RN: [birth year] (71 y.o. [name] Primary Care Averee Harb: PA [name], [place] Other Clinician: Referring Bearl Talarico: Treating Montrice Montuori/Extender: Valeria Batman Weeks in Treatment: 0 Wound Status Wound Number: 2 Primary Lymphedema Etiology: Wound Location: Right, Lateral Foot Wound Open Wounding Event: Gradually Appeared Status: Date Acquired: 11/02/2021 Comorbid Cataracts, Anemia, Lymphedema, Congestive Heart  Failure, Deep Weeks Of Treatment: 0 History: Vein Thrombosis, Hypertension, Peripheral Venous Disease  Clustered Wound: No Photos Wound Measurements Length: (cm) 4 Width: (cm) 1.3 Depth: (cm) 0.1 Area: (cm) 4.084 Volume: (cm) 0.408 % Reduction in Area: 0% % Reduction in Volume: 0% Epithelialization: Medium (34-66%) Tunneling: No Undermining: No Wound Description Classification: Full Thickness Without Exposed Support Structures Wound Margin: Flat and Intact Exudate Amount: Medium Exudate Type: Serous Exudate Color: amber Foul Odor After Cleansing: No Slough/Fibrino Yes Wound Bed Granulation Amount: None Present (0%) Exposed Structure Necrotic Amount: Large (67-100%) Fascia Exposed: No Necrotic Quality: Adherent Slough Fat Layer (Subcutaneous Tissue) Exposed: Yes Tendon Exposed: No Muscle Exposed: No Joint Exposed: No Bone Exposed: No Electronic Signature(s) Signed: 02/09/2022 5:31:58 PM By: Baruch Gouty RN, BSN Entered By: Baruch Gouty on 02/09/2022 10:58:32 -------------------------------------------------------------------------------- Wound Assessment Details Patient Name: Date of Service: [name] 02/09/2022 9:00 A M Medical Record Number: [id number] Patient Account Number: [id number] Date of Birth/Sex: Treating RN: 1951-01-15 (71 y.o. [name] Primary Care Seretha Estabrooks: PA [name], [place] Other Clinician: Referring Raghav Verrilli: Treating [name]/Extender: Valeria Batman Weeks in Treatment: 0 Wound Status Wound Number: 3 Primary Lymphedema Etiology: Wound Location: Right, Medial Lower Leg Wound Open Wounding Event: Gradually Appeared Status: Date Acquired: 12/07/2021 Comorbid Cataracts, Anemia, Lymphedema, Congestive Heart Failure, Deep Weeks Of Treatment: 0 History: Vein Thrombosis, Hypertension, Peripheral Venous Disease Clustered Wound: No Photos Wound Measurements Length: (cm) 2 Width: (cm) 3.4 Depth: (cm) 0.4 Area: (cm)  5.341 Volume: (cm) 2.136 % Reduction in Area: 0% % Reduction in Volume: 0% Epithelialization: Medium (34-66%) Tunneling: No Undermining: No Wound Description Classification: Full Thickness Without Exposed Support Structures Wound Margin: Indistinct, nonvisible Exudate Amount: Medium Exudate Type: Serous Exudate Color: amber Foul Odor After Cleansing: No Slough/Fibrino Yes Wound Bed Granulation Amount: None Present (0%) Exposed Structure Necrotic Amount: Large (67-100%) Fascia Exposed: No Necrotic Quality: Adherent Slough Fat Layer (Subcutaneous Tissue) Exposed: Yes Tendon Exposed: No Muscle Exposed: No Joint Exposed: No Bone Exposed: No Electronic Signature(s) Signed: 02/09/2022 5:31:58 PM By: Baruch Gouty RN, BSN Entered By: Baruch Gouty on 02/09/2022 10:59:09 -------------------------------------------------------------------------------- [place] Details Patient Name: Date of Service: [name] 02/09/2022 9:00 A M Medical Record Number: [id number] Patient Account Number: [id number] Date of Birth/Sex: Treating RN: [birth year] (71 y.o. [name] Primary Care Aima Mcwhirt: PA [name], [place] Other Clinician: Referring Marty Uy: Treating Carroll Ranney/Extender: [name] in Treatment: 0 Vital Signs Time Taken: 09:38 Temperature (F): 98.4 Pulse (bpm): 74 Respiratory Rate (breaths/min): 18 Blood Pressure (mmHg): 145/80 Reference Range: 80 - 120 mg / dl Electronic Signature(s) Signed: 02/17/2022 5:39:28 PM By: Donavan Burnet CHT EMT BS , , Entered By: Donavan Burnet on 02/09/2022 09:39:18

## 2022-02-17 NOTE — Progress Notes (Signed)
[name] ([id number]) Visit Report for 02/17/2022 Arrival Information Details Patient Name: Date of Service: [name], [name] 02/17/2022 10:30 A M Medical Record Number: [id number] Patient Account Number: [id number] Date of Birth/Sex: Treating RN: 1951-05-27 (71 y.o. [name] Primary Care Pegi Milazzo: PA [name], [place] Other Clinician: Referring Dania Marsan: Treating Aeryn Medici/Extender: [name] in Treatment: 1 Visit Information History Since Last Visit Added or deleted any medications: No Patient Arrived: Wheel Chair Any new allergies or adverse reactions: No Arrival Time: 10:31 Had a fall or experienced change in No Accompanied By: self activities of daily living that may affect Transfer Assistance: None risk of falls: Patient Identification Verified: Yes Signs or symptoms of abuse/neglect since last visito No Secondary Verification Process Completed: Yes Hospitalized since last visit: No Patient Requires Transmission-Based Precautions: No Implantable device outside of the clinic excluding No Patient Has Alerts: No cellular tissue based products placed in the center since last visit: Has Dressing in Place as Prescribed: Yes Has Compression in Place as Prescribed: Yes Pain Present Now: No Electronic Signature(s) Signed: 02/17/2022 2:57:53 PM By: Baruch Gouty RN, BSN Entered By: Baruch Gouty on 02/17/2022 10:39:01 -------------------------------------------------------------------------------- Compression Therapy Details Patient Name: Date of Service: [name] 02/17/2022 10:30 A M Medical Record Number: [id number] Patient Account Number: [id number] Date of Birth/Sex: Treating RN: 1951-02-15 (71 y.o. [name] Primary Care Gertude Benito: PA [name], [place] Other Clinician: Referring Burgundy Matuszak: Treating Evi Mccomb/Extender: [name] in Treatment: 1 Compression Therapy Performed for Wound Assessment: Wound #1 Left,Dorsal  Foot Performed By: Clinician Baruch Gouty, RN Compression Type: Three Layer Post Procedure Diagnosis Same as Pre-procedure Electronic Signature(s) Signed: 02/17/2022 2:57:53 PM By: Baruch Gouty RN, BSN Entered By: Baruch Gouty on 02/17/2022 11:42:56 -------------------------------------------------------------------------------- Compression Therapy Details Patient Name: Date of Service: [name], [name] 02/17/2022 10:30 A M Medical Record Number: [id number] Patient Account Number: [id number] Date of Birth/Sex: Treating RN: 1951-06-01 (71 y.o. [name] Primary Care Adriel Desrosier: PA [name], [place] Other Clinician: Referring Victory Strollo: Treating Aasir Daigler/Extender: [name] in Treatment: 1 Compression Therapy Performed for Wound Assessment: Wound #3 Right,Medial Lower Leg Performed By: Clinician Baruch Gouty, RN Compression Type: Three Layer Post Procedure Diagnosis Same as Pre-procedure Electronic Signature(s) Signed: 02/17/2022 2:57:53 PM By: Baruch Gouty RN, BSN Entered By: Baruch Gouty on 02/17/2022 11:43:09 -------------------------------------------------------------------------------- Encounter Discharge Information Details Patient Name: Date of Service: [name] 02/17/2022 10:30 A M Medical Record Number: [id number] Patient Account Number: [id number] Date of Birth/Sex: Treating RN: February 26, 1951 (71 y.o. [name] Primary Care Ramar Nobrega: PA [name], [place] Other Clinician: Referring Hristopher Missildine: Treating Bessie Livingood/Extender: [name] in Treatment: 1 Encounter Discharge Information Items Post Procedure Vitals Discharge Condition: Stable Temperature (F): 97.9 Ambulatory Status: Wheelchair Pulse (bpm): 66 Discharge Destination: Home Respiratory Rate (breaths/min): 18 Transportation: Private Auto Blood Pressure (mmHg): 143/70 Accompanied By: self Schedule Follow-up Appointment: Yes Clinical Summary of  Care: Patient Declined Electronic Signature(s) Signed: 02/17/2022 2:57:53 PM By: Baruch Gouty RN, BSN Entered By: Baruch Gouty on 02/17/2022 12:35:29 -------------------------------------------------------------------------------- Lower Extremity Assessment Details Patient Name: Date of Service: [name], [name] 02/17/2022 10:30 A M Medical Record Number: [id number] Patient Account Number: [id number] Date of Birth/Sex: Treating RN: 1951-01-25 (71 y.o. [name] Primary Care Mareena Cavan: PA [name], [place] Other Clinician: Referring Lawson Isabell: Treating Riaz Onorato/Extender: Valeria Batman Weeks in Treatment: 1 Edema Assessment Assessed: [Left: No] [Right: No] Edema: [Left: Yes] [Right: Yes] Calf Left: Right: Point of Measurement: From Medial Instep 43 cm 46 cm  Ankle Left: Right: Point of Measurement: From Medial Instep 27 cm 27.5 cm Vascular Assessment Pulses: Dorsalis Pedis Palpable: [Left:Yes] [Right:Yes] Electronic Signature(s) Signed: 02/17/2022 2:57:53 PM By: Baruch Gouty RN, BSN Entered By: Baruch Gouty on 02/17/2022 10:55:52 -------------------------------------------------------------------------------- Multi Wound Chart Details Patient Name: Date of Service: [name] 02/17/2022 10:30 A M Medical Record Number: [id number] Patient Account Number: [id number] Date of Birth/Sex: Treating RN: 29-May-1951 (71 y.o. [name] Primary Care Lucy Woolever: PA [name], [place] Other Clinician: Referring Temiloluwa Recchia: Treating Chrissie Dacquisto/Extender: [name] in Treatment: 1 Vital Signs Height(in): Pulse(bpm): 86 Weight(lbs): Blood Pressure(mmHg): 143/70 Body Mass Index(BMI): Temperature(F): 97.9 Respiratory Rate(breaths/min): 18 Photos: Left, Dorsal Foot Right, Lateral Foot Right, Medial Lower Leg Wound Location: Gradually Appeared Gradually Appeared Gradually Appeared Wounding Event: Lymphedema Lymphedema Lymphedema Primary  Etiology: Cataracts, Anemia, Lymphedema, Cataracts, Anemia, Lymphedema, Cataracts, Anemia, Lymphedema, Comorbid History: Congestive Heart Failure, Deep Vein Congestive Heart Failure, Deep Vein Congestive Heart Failure, Deep Vein Thrombosis, Hypertension, Peripheral Thrombosis, Hypertension, Peripheral Thrombosis, Hypertension, Peripheral Venous Disease Venous Disease Venous Disease 12/07/2021 11/02/2021 12/07/2021 Date Acquired: '1 1 1 '$ Weeks of Treatment: Open Open Open Wound Status: No No No Wound Recurrence: 2.6x2.5x0.6 2.2x1x0.1 2.1x3.3x1 Measurements L x W x D (cm) 5.105 1.728 5.443 A (cm) : rea 3.063 0.173 5.443 Volume (cm) : -34.60% 57.70% -1.90% % Reduction in A rea: -101.90% 57.60% -154.80% % Reduction in Volume: 9 Starting Position 1 (o'clock): 10 Ending Position 1 (o'clock): 8 Maximum Distance 1 (cm): No No Yes Undermining: Full Thickness Without Exposed Full Thickness Without Exposed Full Thickness With Exposed Support Classification: Support Structures Support Structures Structures Large Medium Medium Exudate A mount: Serosanguineous Serosanguineous Serosanguineous Exudate Type: red, brown red, brown red, brown Exudate Color: Well defined, not attached Flat and Intact Distinct, outline attached Wound Margin: Medium (34-66%) Small (1-33%) Small (1-33%) Granulation A mount: Red, Pink Pink Red Granulation Quality: Medium (34-66%) Large (67-100%) Large (67-100%) Necrotic A mount: Adherent Slough Adherent [facility], Adherent Slough Necrotic Tissue: Fat Layer (Subcutaneous Tissue): Yes Fat Layer (Subcutaneous Tissue): Yes Fat Layer (Subcutaneous Tissue): Yes Exposed Structures: Fascia: No Fascia: No Bone: Yes Tendon: No Tendon: No Fascia: No Muscle: No Muscle: No Tendon: No Joint: No Joint: No Muscle: No Bone: No Bone: No Joint: No None Small (1-33%) Medium (34-66%) Epithelialization: Debridement - Excisional Debridement - Excisional  Debridement - Excisional Debridement: Pre-procedure Verification/Time Out 11:30 11:30 11:30 Taken: Lidocaine 4% Topical Solution Lidocaine 4% Topical Solution Lidocaine 4% Topical Solution Pain Control: Subcutaneous, Slough Subcutaneous, Slough Subcutaneous, Slough Tissue Debrided: Skin/Subcutaneous Tissue Skin/Subcutaneous Tissue Skin/Subcutaneous Tissue Level: 6.76 2.2 3 Debridement A (sq cm): rea Curette Curette Curette Instrument: Minimum Minimum Minimum Bleeding: Pressure Pressure Pressure Hemostasis A chieved: '2 2 2 '$ Procedural Pain: 0 0 0 Post Procedural Pain: Procedure was tolerated well Procedure was tolerated well Procedure was tolerated well Debridement Treatment Response: 2.6x2.6x0.6 2.2x1x0.1 2.1x3.3x1 Post Debridement Measurements L x W x D (cm) 3.186 0.173 5.443 Post Debridement Volume: (cm) Compression Therapy Debridement Compression Therapy Procedures Performed: Debridement Debridement Wound Number: 4 N/A N/A Photos: N/A N/A Right, Medial Foot N/A N/A Wound Location: Gradually Appeared N/A N/A Wounding Event: Lymphedema N/A N/A Primary Etiology: Cataracts, Anemia, Lymphedema, N/A N/A Comorbid History: Congestive Heart Failure, Deep Vein Thrombosis, Hypertension, Peripheral Venous Disease 02/17/2022 N/A N/A Date Acquired: 0 N/A N/A Weeks of Treatment: Open N/A N/A Wound Status: No N/A N/A Wound Recurrence: 0.8x0.5x0.1 N/A N/A Measurements L x W x D (cm) 0.314 N/A N/A A (cm) : rea 0.031 N/A  N/A Volume (cm) : 0.00% N/A N/A % Reduction in A rea: 0.00% N/A N/A % Reduction in Volume: No N/A N/A Undermining: Full Thickness Without Exposed N/A N/A Classification: Support Structures Medium N/A N/A Exudate A mount: Serosanguineous N/A N/A Exudate Type: red, brown N/A N/A Exudate Color: Flat and Intact N/A N/A Wound Margin: Medium (34-66%) N/A N/A Granulation A mount: Red N/A N/A Granulation Quality: Medium (34-66%) N/A  N/A Necrotic A mount: Adherent Slough N/A N/A Necrotic Tissue: Fat Layer (Subcutaneous Tissue): Yes N/A N/A Exposed Structures: Fascia: No Tendon: No Muscle: No Joint: No Bone: No Small (1-33%) N/A N/A Epithelialization: Debridement - Selective/Open Wound N/A N/A Debridement: Pre-procedure Verification/Time Out 11:30 N/A N/A Taken: Lidocaine 4% Topical Solution N/A N/A Pain Control: Slough N/A N/A Tissue Debrided: Non-Viable Tissue N/A N/A Level: 0.4 N/A N/A Debridement A (sq cm): rea Curette N/A N/A Instrument: Minimum N/A N/A Bleeding: Pressure N/A N/A Hemostasis A chieved: 2 N/A N/A Procedural Pain: 0 N/A N/A Post Procedural Pain: Procedure was tolerated well N/A N/A Debridement Treatment Response: 0.8x0.5x0.1 N/A N/A Post Debridement Measurements L x W x D (cm) 0.031 N/A N/A Post Debridement Volume: (cm) Debridement N/A N/A Procedures Performed: Treatment Notes Electronic Signature(s) Signed: 02/17/2022 12:06:16 PM By: Fredirick Maudlin MD FACS Signed: 02/17/2022 2:57:53 PM By: Baruch Gouty RN, BSN Entered By: Fredirick Maudlin on 02/17/2022 12:06:16 -------------------------------------------------------------------------------- Multi-Disciplinary Care Plan Details Patient Name: Date of Service: [name], [name] 02/17/2022 10:30 A M Medical Record Number: [id number] Patient Account Number: [id number] Date of Birth/Sex: Treating RN: Mar 19, 1951 (71 y.o. [name] Primary Care Lulla Linville: PA [name], [place] Other Clinician: Referring Aldan Camey: Treating Jeanny Rymer/Extender: [name] in Treatment: 1 Multidisciplinary Care Plan reviewed with physician Active Inactive Venous Leg Ulcer Nursing Diagnoses: Knowledge deficit related to disease process and management Potential for venous Insuffiency (use before diagnosis confirmed) Goals: Patient will maintain optimal edema control Date Initiated: 02/17/2022 Target Resolution Date:  03/10/2022 Goal Status: Active Interventions: Assess peripheral edema status every visit. Compression as ordered Provide education on venous insufficiency Treatment Activities: Non-invasive vascular studies : 02/17/2022 Therapeutic compression applied : 02/17/2022 Notes: Wound/Skin Impairment Nursing Diagnoses: Impaired tissue integrity Knowledge deficit related to ulceration/compromised skin integrity Goals: Patient/caregiver will verbalize understanding of skin care regimen Date Initiated: 02/17/2022 Target Resolution Date: 03/10/2022 Goal Status: Active Ulcer/skin breakdown will have a volume reduction of 30% by week 4 Date Initiated: 02/17/2022 Target Resolution Date: 03/10/2022 Goal Status: Active Interventions: Assess patient/caregiver ability to obtain necessary supplies Assess patient/caregiver ability to perform ulcer/skin care regimen upon admission and as needed Assess ulceration(s) every visit Provide education on ulcer and skin care Treatment Activities: Skin care regimen initiated : 02/17/2022 Topical wound management initiated : 02/17/2022 Notes: Electronic Signature(s) Signed: 02/17/2022 2:57:53 PM By: Baruch Gouty RN, BSN Entered By: Baruch Gouty on 02/17/2022 11:14:41 -------------------------------------------------------------------------------- Pain Assessment Details Patient Name: Date of Service: [name] 02/17/2022 10:30 A M Medical Record Number: [id number] Patient Account Number: [id number] Date of Birth/Sex: Treating RN: 1951/03/05 (71 y.o. [name] Primary Care Chance Karam: PA [name], [place] Other Clinician: Referring Rusti Arizmendi: Treating Loretha Ure/Extender: [name] in Treatment: 1 Active Problems Location of Pain Severity and Description of Pain Patient Has Paino Yes Site Locations Pain Location: Pain in Ulcers With Dressing Change: Yes Duration of the Pain. Constant / Intermittento Intermittent Rate the  pain. Current Pain Level: 0 Worst Pain Level: 8 Least Pain Level: 0 Character of Pain Describe the Pain: Throbbing Pain Management and Medication Current  Pain Management: Medication: Yes Is the Current Pain Management Adequate: Adequate How does your wound impact your activities of daily livingo Sleep: Yes Bathing: No Appetite: No Relationship With Others: No Bladder Continence: No Emotions: No Bowel Continence: No Work: No Toileting: No Drive: No Dressing: No Hobbies: No Electronic Signature(s) Signed: 02/17/2022 2:57:53 PM By: Baruch Gouty RN, BSN Entered By: Baruch Gouty on 02/17/2022 10:44:10 -------------------------------------------------------------------------------- Patient/Caregiver Education Details Patient Name: Date of Service: Ferrall, [name] 7/26/2023andnbsp10:30 [place] Record Number: [id number] Patient Account Number: [id number] Date of Birth/Gender: Treating RN: 1951-04-01 (71 y.o. [name] Primary Care Physician: PA [name], [place] Other Clinician: Referring Physician: Treating Physician/Extender: [name] in Treatment: 1 Education Assessment Education Provided To: Patient Education Topics Provided Venous: Methods: Explain/Verbal Responses: Reinforcements needed, State content correctly Wound/Skin Impairment: Methods: Explain/Verbal Responses: Reinforcements needed, State content correctly Electronic Signature(s) Signed: 02/17/2022 2:57:53 PM By: Baruch Gouty RN, BSN Entered By: Baruch Gouty on 02/17/2022 11:13:28 -------------------------------------------------------------------------------- Wound Assessment Details Patient Name: Date of Service: [name] 02/17/2022 10:30 A M Medical Record Number: [id number] Patient Account Number: [id number] Date of Birth/Sex: Treating RN: 12-11-1950 (71 y.o. [name] Primary Care Sebert Stollings: PA [name], [place] Other Clinician: Referring  Jackqulyn Mendel: Treating Amylah Will/Extender: Valeria Batman Weeks in Treatment: 1 Wound Status Wound Number: 1 Primary Lymphedema Etiology: Wound Location: Left, Dorsal Foot Wound Open Wounding Event: Gradually Appeared Status: Date Acquired: 12/07/2021 Comorbid Cataracts, Anemia, Lymphedema, Congestive Heart Failure, Deep Weeks Of Treatment: 1 History: Vein Thrombosis, Hypertension, Peripheral Venous Disease Clustered Wound: No Photos Wound Measurements Length: (cm) 2.6 Width: (cm) 2.5 Depth: (cm) 0.6 Area: (cm) 5.105 Volume: (cm) 3.063 % Reduction in Area: -34.6% % Reduction in Volume: -101.9% Epithelialization: None Tunneling: No Undermining: No Wound Description Classification: Full Thickness Without Exposed Support Structures Wound Margin: Well defined, not attached Exudate Amount: Large Exudate Type: Serosanguineous Exudate Color: red, brown Foul Odor After Cleansing: No Slough/Fibrino Yes Wound Bed Granulation Amount: Medium (34-66%) Exposed Structure Granulation Quality: Red, Pink Fascia Exposed: No Necrotic Amount: Medium (34-66%) Fat Layer (Subcutaneous Tissue) Exposed: Yes Necrotic Quality: Adherent Slough Tendon Exposed: No Muscle Exposed: No Joint Exposed: No Bone Exposed: No Treatment Notes Wound #1 (Foot) Wound Laterality: Dorsal, Left Cleanser Peri-Wound Care Sween Lotion (Moisturizing lotion) Discharge Instruction: Apply moisturizing lotion as directed Topical Gentamicin Discharge Instruction: As directed by physician Primary Dressing KerraCel Ag Gelling Fiber Dressing, 4x5 in (silver alginate) Discharge Instruction: Apply silver alginate to wound bed as instructed Secondary Dressing ABD Pad, 8x10 Discharge Instruction: Apply over primary dressing as directed. Woven Gauze Sponge, Non-Sterile 4x4 in Discharge Instruction: Apply over primary dressing as directed. Secured With Compression Wrap ThreePress (3 layer compression  wrap) Discharge Instruction: Apply three layer compression as directed. Compression Stockings Add-Ons Electronic Signature(s) Signed: 02/17/2022 2:57:53 PM By: Baruch Gouty RN, BSN Entered By: Baruch Gouty on 02/17/2022 11:08:59 -------------------------------------------------------------------------------- Wound Assessment Details Patient Name: Date of Service: [name], [name] 02/17/2022 10:30 A M Medical Record Number: [id number] Patient Account Number: [id number] Date of Birth/Sex: Treating RN: 05/08/1951 (71 y.o. [name] Primary Care Tanika Bracco: PA [name], [place] Other Clinician: Referring Aven Cegielski: Treating Shikha Bibb/Extender: Valeria Batman Weeks in Treatment: 1 Wound Status Wound Number: 2 Primary Lymphedema Etiology: Wound Location: Right, Lateral Foot Wound Open Wounding Event: Gradually Appeared Status: Date Acquired: 11/02/2021 Comorbid Cataracts, Anemia, Lymphedema, Congestive Heart Failure, Deep Weeks Of Treatment: 1 History: Vein Thrombosis, Hypertension, Peripheral Venous Disease Clustered Wound: No Photos Wound Measurements Length: (cm) 2.2 Width: (  cm) 1 Depth: (cm) 0.1 Area: (cm) 1.728 Volume: (cm) 0.173 % Reduction in Area: 57.7% % Reduction in Volume: 57.6% Epithelialization: Small (1-33%) Tunneling: No Undermining: No Wound Description Classification: Full Thickness Without Exposed Support Structures Wound Margin: Flat and Intact Exudate Amount: Medium Exudate Type: Serosanguineous Exudate Color: red, brown Foul Odor After Cleansing: No Slough/Fibrino Yes Wound Bed Granulation Amount: Small (1-33%) Exposed Structure Granulation Quality: Pink Fascia Exposed: No Necrotic Amount: Large (67-100%) Fat Layer (Subcutaneous Tissue) Exposed: Yes Necrotic Quality: Adherent Slough Tendon Exposed: No Muscle Exposed: No Joint Exposed: No Bone Exposed: No Treatment Notes Wound #2 (Foot) Wound Laterality: Right,  Lateral Cleanser Peri-Wound Care Sween Lotion (Moisturizing lotion) Discharge Instruction: Apply moisturizing lotion as directed Topical Gentamicin Discharge Instruction: As directed by physician Primary Dressing KerraCel Ag Gelling Fiber Dressing, 4x5 in (silver alginate) Discharge Instruction: Apply silver alginate to wound bed as instructed Secondary Dressing ABD Pad, 8x10 Discharge Instruction: Apply over primary dressing as directed. Woven Gauze Sponge, Non-Sterile 4x4 in Discharge Instruction: Apply over primary dressing as directed. Secured With Compression Wrap ThreePress (3 layer compression wrap) Discharge Instruction: Apply three layer compression as directed. Compression Stockings Add-Ons Electronic Signature(s) Signed: 02/17/2022 2:57:53 PM By: Baruch Gouty RN, BSN Entered By: Baruch Gouty on 02/17/2022 11:09:27 -------------------------------------------------------------------------------- Wound Assessment Details Patient Name: Date of Service: [name], [name] 02/17/2022 10:30 A M Medical Record Number: [id number] Patient Account Number: [id number] Date of Birth/Sex: Treating RN: [birth year] (71 y.o. [name] Primary Care Sofia Vanmeter: PA [name], [place] Other Clinician: Referring Dalores Weger: Treating Amarra Sawyer/Extender: Valeria Batman Weeks in Treatment: 1 Wound Status Wound Number: 3 Primary Lymphedema Etiology: Wound Location: Right, Medial Lower Leg Wound Open Wounding Event: Gradually Appeared Status: Date Acquired: 12/07/2021 Comorbid Cataracts, Anemia, Lymphedema, Congestive Heart Failure, Deep Weeks Of Treatment: 1 History: Vein Thrombosis, Hypertension, Peripheral Venous Disease Clustered Wound: No Photos Wound Measurements Length: (cm) 2.1 Width: (cm) 3.3 Depth: (cm) 1 Area: (cm) 5.443 Volume: (cm) 5.443 Wound Description Classification: Full Thickness With Exposed Support Struct Wound Margin: Distinct, outline  attached Exudate Amount: Medium Exudate Type: Serosanguineous Exudate Color: red, brown ures Foul Odor After Cleansing: No Slough/Fibrino Yes % Reduction in Area: -1.9% % Reduction in Volume: -154.8% Epithelialization: Medium (34-66%) Tunneling: No Undermining: Yes Starting Position (o'clock): 9 Ending Position (o'clock): 10 Maximum Distance: (cm) 8 Wound Bed Granulation Amount: Small (1-33%) Exposed Structure Granulation Quality: Red Fascia Exposed: No Necrotic Amount: Large (67-100%) Fat Layer (Subcutaneous Tissue) Exposed: Yes Necrotic Quality: Eschar, Adherent Slough Tendon Exposed: No Muscle Exposed: No Joint Exposed: No Bone Exposed: Yes Treatment Notes Wound #3 (Lower Leg) Wound Laterality: Right, Medial Cleanser Peri-Wound Care Sween Lotion (Moisturizing lotion) Discharge Instruction: Apply moisturizing lotion as directed Topical Gentamicin Discharge Instruction: As directed by physician Primary Dressing KerraCel Ag Gelling Fiber Dressing, 4x5 in (silver alginate) Discharge Instruction: Apply silver alginate to wound bed as instructed Secondary Dressing ABD Pad, 8x10 Discharge Instruction: Apply over primary dressing as directed. Woven Gauze Sponge, Non-Sterile 4x4 in Discharge Instruction: Apply over primary dressing as directed. Secured With Compression Wrap ThreePress (3 layer compression wrap) Discharge Instruction: Apply three layer compression as directed. Compression Stockings Add-Ons Electronic Signature(s) Signed: 02/17/2022 2:57:53 PM By: Baruch Gouty RN, BSN Entered By: Baruch Gouty on 02/17/2022 11:09:54 -------------------------------------------------------------------------------- Wound Assessment Details Patient Name: Date of Service: [name], [name] 02/17/2022 10:30 A M Medical Record Number: [id number] Patient Account Number: [id number] Date of Birth/Sex: Treating RN: 02/08/1951 (71 y.o. [name] Primary Care Nakina Spatz: PA  [place], [place] Other  Clinician: Referring Jaheim Canino: Treating [name]/Extender: Valeria Batman Weeks in Treatment: 1 Wound Status Wound Number: 4 Primary Lymphedema Etiology: Wound Location: Right, Medial Foot Wound Open Wounding Event: Gradually Appeared Status: Date Acquired: 02/17/2022 Comorbid Cataracts, Anemia, Lymphedema, Congestive Heart Failure, Deep Weeks Of Treatment: 0 History: Vein Thrombosis, Hypertension, Peripheral Venous Disease Clustered Wound: No Photos Wound Measurements Length: (cm) 0.8 Width: (cm) 0.5 Depth: (cm) 0.1 Area: (cm) 0.314 Volume: (cm) 0.031 % Reduction in Area: 0% % Reduction in Volume: 0% Epithelialization: Small (1-33%) Tunneling: No Undermining: No Wound Description Classification: Full Thickness Without Exposed Support Structures Wound Margin: Flat and Intact Exudate Amount: Medium Exudate Type: Serosanguineous Exudate Color: red, brown Foul Odor After Cleansing: No Slough/Fibrino Yes Wound Bed Granulation Amount: Medium (34-66%) Exposed Structure Granulation Quality: Red Fascia Exposed: No Necrotic Amount: Medium (34-66%) Fat Layer (Subcutaneous Tissue) Exposed: Yes Necrotic Quality: Adherent Slough Tendon Exposed: No Muscle Exposed: No Joint Exposed: No Bone Exposed: No Treatment Notes Wound #4 (Foot) Wound Laterality: Right, Medial Cleanser Peri-Wound Care Sween Lotion (Moisturizing lotion) Discharge Instruction: Apply moisturizing lotion as directed Topical Gentamicin Discharge Instruction: As directed by physician Primary Dressing KerraCel Ag Gelling Fiber Dressing, 4x5 in (silver alginate) Discharge Instruction: Apply silver alginate to wound bed as instructed Secondary Dressing ABD Pad, 8x10 Discharge Instruction: Apply over primary dressing as directed. Woven Gauze Sponge, Non-Sterile 4x4 in Discharge Instruction: Apply over primary dressing as directed. Secured With Compression Wrap ThreePress  (3 layer compression wrap) Discharge Instruction: Apply three layer compression as directed. Compression Stockings Add-Ons Electronic Signature(s) Signed: 02/17/2022 2:57:53 PM By: Baruch Gouty RN, BSN Entered By: Baruch Gouty on 02/17/2022 11:10:19 -------------------------------------------------------------------------------- Vitals Details Patient Name: Date of Service: [name] 02/17/2022 10:30 A M Medical Record Number: [id number] Patient Account Number: [id number] Date of Birth/Sex: Treating RN: November 11, 1950 (71 y.o. [name] Primary Care Ronni Osterberg: PA [name], [place] Other Clinician: Referring Lewellyn Fultz: Treating Reizel Calzada/Extender: [name] in Treatment: 1 Vital Signs Time Taken: 10:39 Temperature (F): 97.9 Pulse (bpm): 66 Respiratory Rate (breaths/min): 18 Blood Pressure (mmHg): 143/70 Reference Range: 80 - 120 mg / dl Electronic Signature(s) Signed: 02/17/2022 2:57:53 PM By: Baruch Gouty RN, BSN Entered By: Baruch Gouty on 02/17/2022 10:39:32

## 2022-02-19 ENCOUNTER — Ambulatory Visit (HOSPITAL_COMMUNITY)
Admission: RE | Admit: 2022-02-19 | Discharge: 2022-02-19 | Disposition: A | Discharge status: Home / Self Care | Payer: Medicare Other | Source: Ambulatory Visit | Attending: General Surgery | Admitting: General Surgery

## 2022-02-19 ENCOUNTER — Other Ambulatory Visit (HOSPITAL_COMMUNITY): Payer: Self-pay | Admitting: General Surgery

## 2022-02-19 DIAGNOSIS — L97812 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d: Secondary | ICD-10-CM

## 2022-02-19 DIAGNOSIS — M7989 Other specified soft tissue disorders: Secondary | ICD-10-CM | POA: Diagnosis not present

## 2022-02-24 ENCOUNTER — Encounter (HOSPITAL_BASED_OUTPATIENT_CLINIC_OR_DEPARTMENT_OTHER): Payer: Medicare Other | Attending: Physician Assistant | Admitting: Physician Assistant

## 2022-02-24 DIAGNOSIS — L97812 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d: Secondary | ICD-10-CM | POA: Insufficient documentation

## 2022-02-24 DIAGNOSIS — Z86718 Personal history of other venous thrombosis and embolism: Secondary | ICD-10-CM | POA: Insufficient documentation

## 2022-02-24 DIAGNOSIS — Z87891 Personal history of nicotine dependence: Secondary | ICD-10-CM | POA: Diagnosis not present

## 2022-02-24 DIAGNOSIS — I251 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: Secondary | ICD-10-CM | POA: Diagnosis not present

## 2022-02-24 DIAGNOSIS — L97522 Non-pressure chronic ulcer of other part of left foot with fat layer exposed: Secondary | ICD-10-CM | POA: Insufficient documentation

## 2022-02-24 DIAGNOSIS — L97512 Non-pressure chronic ulcer of other part of right foot with fat layer exposed: Secondary | ICD-10-CM | POA: Insufficient documentation

## 2022-02-24 DIAGNOSIS — B965 Pseudomonas (aeruginosa) (mallei) (pseudomallei) as the cause of diseases classified elsewhere: Secondary | ICD-10-CM | POA: Diagnosis not present

## 2022-02-24 DIAGNOSIS — L97816 Non-pressure chronic ulcer of other part of right lower leg with bone involvement without evidence of necrosis: Secondary | ICD-10-CM | POA: Diagnosis not present

## 2022-02-24 DIAGNOSIS — I890 Lymphedema, not elsewhere classified: Secondary | ICD-10-CM | POA: Diagnosis not present

## 2022-02-24 DIAGNOSIS — L080 Pyoderma: Secondary | ICD-10-CM | POA: Diagnosis not present

## 2022-02-24 DIAGNOSIS — I872 Venous insufficiency (chronic) (peripheral): Secondary | ICD-10-CM | POA: Diagnosis not present

## 2022-02-24 DIAGNOSIS — B957 Other staphylococcus as the cause of diseases classified elsewhere: Secondary | ICD-10-CM | POA: Diagnosis not present

## 2022-02-24 DIAGNOSIS — I110 Hypertensive heart disease with heart failure: Secondary | ICD-10-CM | POA: Insufficient documentation

## 2022-02-24 DIAGNOSIS — I509 Heart failure, unspecified: Secondary | ICD-10-CM | POA: Insufficient documentation

## 2022-02-24 DIAGNOSIS — E669 Obesity, unspecified: Secondary | ICD-10-CM | POA: Diagnosis not present

## 2022-02-24 NOTE — Progress Notes (Addendum)
SALAM, MICUCCI ([id number]) Visit Report for 02/24/2022 Chief Complaint Document Details Patient Name: Date of Service: [name], [name] 02/24/2022 10:00 [place] Record Number: [id number] Patient Account Number: [id number] Date of Birth/Sex: Treating RN: [birth year] (71 y.o. M) Primary Care Provider: PA [name], [name] Other Clinician: Referring Provider: Treating Provider/Extender: Alvina Chou Weeks in Treatment: 2 Information Obtained from: Patient Chief Complaint Patient seen for complaints of Non-Healing Wounds. Electronic Signature(s) Signed: 02/24/2022 10:24:15 AM By: Worthy Keeler PA-C Entered By: Worthy Keeler on 02/24/2022 10:24:15 -------------------------------------------------------------------------------- Debridement Details Patient Name: Date of Service: [name], [name] 02/24/2022 10:00 [place] Record Number: [id number] Patient Account Number: [id number] Date of Birth/Sex: Treating RN: 1951-04-24 (71 y.o. Janyth Contes Primary Care Provider: PA [name], [name] Other Clinician: Referring Provider: Treating Provider/Extender: Alvina Chou Weeks in Treatment: 2 Debridement Performed for Assessment: Wound #3 Right,Medial Lower Leg Performed By: Physician Worthy Keeler, PA Debridement Type: Debridement Level of Consciousness (Pre-procedure): Awake and Alert Pre-procedure Verification/Time Out Yes - 10:34 Taken: Start Time: 10:34 Pain Control: Lidocaine 5% topical ointment T Area Debrided (L x W): otal 2.6 (cm) x 3.9 (cm) = 10.14 (cm) Tissue and other material debrided: Non-Viable, Other: subcutaneous calcium deposit Level: Non-Viable Tissue Debridement Description: Selective/Open Wound Instrument: Forceps Specimen: Tissue Culture Number of Specimens T aken: 1 Bleeding: Minimum Hemostasis Achieved: Pressure Procedural Pain: 0 Post Procedural Pain: 0 Response to Treatment: Procedure was tolerated well Level of Consciousness (Post-  Awake and Alert procedure): Post Debridement Measurements of Total Wound Length: (cm) 2.6 Width: (cm) 3.9 Depth: (cm) 0.6 Volume: (cm) 4.778 Character of Wound/Ulcer Post Debridement: Improved Post Procedure Diagnosis Same as Pre-procedure Electronic Signature(s) Signed: 02/24/2022 5:04:53 PM By: Worthy Keeler PA-C Signed: 02/24/2022 5:11:01 PM By: Adline Peals Entered By: Adline Peals on 02/24/2022 10:39:28 -------------------------------------------------------------------------------- HPI Details Patient Name: Date of Service: [name] 02/24/2022 10:00 [place] Record Number: [id number] Patient Account Number: [id number] Date of Birth/Sex: Treating RN: 1951-03-19 (71 y.o. M) Primary Care Provider: PA [name], [name] Other Clinician: Referring Provider: Treating Provider/Extender: [name], [name] Weeks in Treatment: 2 History of Present Illness HPI Description: ADMISSION 02/09/2022 This is a 71 year old man who recently moved to [place] from [place]. He has a history of of coronary artery disease and prior left popliteal vein DVT . He is not diabetic and does not smoke. 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 He has had multiple spinal surgeries and has limited mobility. He primarily uses a motorized wheelchair. He has had lymphedema for many years and does have lymphedema pumps although he says he does not use them frequently. He has wounds on his bilateral lower extremities. He was receiving care in [place] for these prior to his move. They were apparently packing his wounds with Betadine soaked gauze. He has worn compression wraps in the past but not recently. He did say that they helped tremendously. In clinic today, his right ABI is noncompressible but has good signals; the left ABI was 1.17. [name] formal vascular studies have been performed. On his left dorsal foot, there is a circular lesion that exposes the fat layer. There is  fairly heavy slough accumulation within the wound, but [name] significant odor. On his right medial calf, he has multiple pinholes that have slough buildup in them and probe for about 2 to 4 mm in depth. They are draining clear fluid. On his right lateral midfoot, he has  ulceration consistent with venous stasis. It is geographic and has slough accumulation. 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 02/17/2022: All of the wounds are little bit larger today and have more slough accumulation. Today, the medial calf openings are larger and probe to something that feels calcified. There is odor coming from his wounds. His vascular studies are scheduled for August 9 and 10. 02-24-2022 upon evaluation today patient presents for follow-up concerning his ongoing issues here with his lower extremities. With that being said he does have significant problems with what appears to be cellulitis unfortunately the PCR culture that was sent last week got crushed by UPS in transit. With that being said we are going to reobtain a culture today although I am actually to do it on the right medial leg as this seems to be the most inflamed area today where there is some questionable drainage as well. I am going to remove some of the calcium deposits which are loosening obtain a deeper culture from this area. Fortunately I do not see any evidence of active infection systemically which is good news but locally I think we are having a bigger issue here. He does have his appointment next Thursday with vascular. Patient has also been referred to [facility] for further evaluation and treatment of the calcinosis for possible sulfate injections. Electronic Signature(s) Signed: 02/24/2022 10:44:33 AM By: Worthy Keeler PA-C Entered By: Worthy Keeler on 02/24/2022  10:44:32 -------------------------------------------------------------------------------- Physical Exam Details Patient Name: Date of Service: [name], [name] 02/24/2022 10:00 [place] Record Number: [id number] Patient Account Number: [id number] Date of Birth/Sex: Treating RN: [birth year] (71 y.o. M) Primary Care Provider: PA [name], [name] Other Clinician: Referring Provider: Treating Provider/Extender: [name], [name] Weeks in Treatment: 2 Constitutional Obese and well-hydrated in [name] acute distress. Respiratory normal breathing without difficulty. Psychiatric this patient is able to make decisions and demonstrates good insight into disease process. Alert and Oriented x 3. pleasant and cooperative. Notes Upon evaluation today patient had purulent drainage coming from the right medial lower extremity. Unfortunately this does seem to be a significant issue there is erythema in the leg as well as well as the dorsal surface of the foot which is also an issue at this point. I did perform debridement using pickups and scissors to remove some of the calcium deposits that were very loose on the medial leg of the right side. Removing these I was able to get a lot of the space cleared out for [place] to be able to put some of the calcium alginate into the wound also was able to obtain a deeper wound culture which I think should be more appropriate as well. Electronic Signature(s) Signed: 02/24/2022 10:45:39 AM By: Worthy Keeler PA-C Entered By: Worthy Keeler on 02/24/2022 10:45:38 -------------------------------------------------------------------------------- Physician Orders Details Patient Name: Date of Service: [name], [name] 02/24/2022 10:00 [place] Record Number: [id number] Patient Account Number: [id number] Date of Birth/Sex: Treating RN: Aug 11, 1950 (71 y.o. 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 - Dr. [name] Rm 4 8/10 at 2:00 PM- Return A Bathing/ Shower/ Hygiene May shower with protection but do not get wound dressing(s) wet. Edema Control - Lymphedema / SCD / Other Lymphedema Pumps. Use Lymphedema pumps on leg(s) 2-3 times a day for 45-60 minutes. If wearing any wraps or hose, do not remove them. Continue exercising as instructed. - use 1-2 times per day over wraps Elevate legs to the level of the heart or above for 30 minutes daily and/or when sitting, a frequency of: Exercise regularly Wound Treatment Wound #1 - Foot Wound Laterality: Dorsal, Left Peri-Wound Care: Sween Lotion (Moisturizing lotion) 1 x Per Week Discharge Instructions: Apply moisturizing lotion as directed Topical: Gentamicin 1 x Per Week Discharge Instructions: As directed by physician Prim Dressing: KerraCel Ag Gelling Fiber Dressing, 4x5 in (silver alginate) 1 x Per Week ary Discharge Instructions: Apply silver alginate to wound bed as instructed Secondary Dressing: ABD Pad, 8x10 1 x Per Week Discharge Instructions: Apply over primary dressing as directed. Secondary Dressing: Woven Gauze Sponge, Non-Sterile 4x4 in 1 x Per Week Discharge Instructions: Apply over primary dressing as  directed. Compression Wrap: ThreePress (3 layer compression wrap) 1 x Per Week Discharge Instructions: Apply three layer compression as directed. Wound #2 - Foot Wound Laterality: Right, Lateral Peri-Wound Care: Sween Lotion (Moisturizing lotion) 1 x Per Week Discharge Instructions: Apply moisturizing lotion as directed Topical: Gentamicin 1 x Per Week Discharge Instructions: As directed by physician Prim Dressing: KerraCel Ag Gelling Fiber Dressing, 4x5 in (silver alginate) 1 x Per Week ary Discharge Instructions: Apply silver alginate to wound bed as instructed Secondary Dressing: ABD Pad, 8x10 1 x Per Week Discharge Instructions: Apply over primary dressing as directed. Secondary Dressing: Woven Gauze Sponge, Non-Sterile 4x4 in 1 x Per Week Discharge Instructions: Apply over primary dressing as directed. Compression Wrap: ThreePress (3 layer compression wrap) 1 x Per Week Discharge Instructions: Apply three layer compression as directed. Wound #3 - Lower Leg Wound Laterality: Right, Medial Peri-Wound Care: Sween Lotion (Moisturizing lotion) 1 x Per Week Discharge Instructions: Apply moisturizing lotion as directed Topical: Gentamicin 1 x Per Week Discharge Instructions: As directed by physician Prim Dressing: KerraCel Ag Gelling Fiber Dressing, 4x5 in (silver alginate) 1 x Per Week ary Discharge Instructions: Apply silver alginate to wound bed as instructed Secondary Dressing: ABD Pad, 8x10 1 x Per Week Discharge Instructions: Apply over primary dressing as directed. Secondary Dressing: Woven Gauze Sponge, Non-Sterile 4x4 in 1 x Per Week Discharge Instructions: Apply over primary dressing as directed. Compression Wrap: ThreePress (3 layer compression wrap) 1 x Per Week Discharge Instructions: Apply three layer compression as directed. Wound #4 - Foot Wound Laterality: Right, Medial Peri-Wound Care: Sween Lotion (Moisturizing lotion) 1 x Per Week Discharge Instructions: Apply  moisturizing lotion as directed Topical: Gentamicin 1 x Per Week Discharge Instructions: As directed by physician Prim Dressing: KerraCel Ag Gelling Fiber Dressing, 4x5 in (silver alginate) 1 x Per Week ary Discharge Instructions: Apply silver alginate to wound bed as instructed Secondary Dressing: ABD Pad, 8x10 1 x Per Week Discharge Instructions: Apply over primary dressing as directed. Secondary Dressing: Woven Gauze Sponge, Non-Sterile 4x4 in 1 x Per Week Discharge Instructions: Apply over primary dressing as directed. Compression Wrap: ThreePress (  3 layer compression wrap) 1 x Per Week Discharge Instructions: Apply three layer compression as directed. Patient Medications llergies: [name] Known Allergies A Notifications Medication Indication Start End 02/24/2022 Bactrim DS DOSE 1 - oral 800 mg-160 mg tablet - 1 tablet oral taken 2 times per day for 10 days. Do not take potassium with this medication Electronic Signature(s) Signed: 02/24/2022 5:04:53 PM By: Worthy Keeler PA-C Signed: 02/24/2022 5:11:01 PM By: Adline Peals Previous Signature: 02/24/2022 10:47:15 AM Version By: Worthy Keeler PA-C Entered By: Adline Peals on 02/24/2022 10:54:10 -------------------------------------------------------------------------------- Problem List Details Patient Name: Date of Service: [name] 02/24/2022 10:00 [place] Record Number: [id number] Patient Account Number: [id number] Date of Birth/Sex: Treating RN: 1950-09-23 (71 y.o. M) Primary Care Provider: PA [name], [name] Other Clinician: Referring Provider: Treating Provider/Extender: [name], [name] Weeks in Treatment: 2 Active Problems ICD-10 Encounter Code Description Active Date MDM Diagnosis L97.812 Non-pressure chronic ulcer of other part of right lower leg with fat layer 02/09/2022 [name] Yes exposed L97.522 Non-pressure chronic ulcer of other part of left foot with fat layer exposed 02/09/2022 [name] Yes I87.2  Venous insufficiency (chronic) (peripheral) 02/09/2022 [name] Yes I10 Essential (primary) hypertension 02/09/2022 [name] Yes I25.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 02/09/2022 [name] Yes I89.0 Lymphedema, not elsewhere classified 02/09/2022 [name] Yes Z86.718 Personal history of other venous thrombosis and embolism 02/09/2022 [name] Yes L97.512 Non-pressure chronic ulcer of other part of right foot with fat layer exposed 02/17/2022 [name] Yes Inactive Problems Resolved Problems Electronic Signature(s) Signed: 02/24/2022 10:23:59 AM By: Worthy Keeler PA-C Entered By: Worthy Keeler on 02/24/2022 10:23:59 -------------------------------------------------------------------------------- Progress Note Details Patient Name: Date of Service: [name] 02/24/2022 10:00 [place] Record Number: [id number] Patient Account Number: [id number] Date of Birth/Sex: Treating RN: Mar 09, 1951 (72 y.o. M) Primary Care Provider: PA [name], [name] Other Clinician: Referring Provider: Treating Provider/Extender: Alvina Chou Weeks in Treatment: 2 Subjective Chief Complaint Information obtained from Patient Patient seen for complaints of Non-Healing Wounds. History of Present Illness (HPI) ADMISSION 02/09/2022 This is a 71 year old man who recently moved to [place] from [place]. He has a history of of coronary artery disease and prior left popliteal vein DVT . He is not diabetic and does not smoke. 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 He has had multiple spinal surgeries and has limited mobility. He primarily uses a motorized wheelchair. He has had lymphedema for many years and does have lymphedema pumps although he says he does not use them frequently. He has wounds on his bilateral lower extremities. He was receiving care in [place] for these prior to his move. They were apparently packing his wounds with Betadine soaked gauze. He has worn  compression wraps in the past but not recently. He did say that they helped tremendously. In clinic today, his right ABI is noncompressible but has good signals; the left ABI was 1.17. [name] formal vascular studies have been performed. On his left dorsal foot, there is a circular lesion that exposes the fat layer. There is fairly heavy slough accumulation within the wound, but [name] significant odor. On his right medial calf, he has multiple pinholes that have slough buildup in them and probe for about 2 to 4 mm in depth. They are draining clear fluid. On his right lateral midfoot, he has ulceration consistent with venous stasis. It is geographic and has slough accumulation. He has changes in his lower extremities  consistent with stasis dermatitis, but [name] hemosiderin deposition or thickening of the skin. 02/17/2022: All of the wounds are little bit larger today and have more slough accumulation. Today, the medial calf openings are larger and probe to something that feels calcified. There is odor coming from his wounds. His vascular studies are scheduled for August 9 and 10. 02-24-2022 upon evaluation today patient presents for follow-up concerning his ongoing issues here with his lower extremities. With that being said he does have significant problems with what appears to be cellulitis unfortunately the PCR culture that was sent last week got crushed by UPS in transit. With that being said we are going to reobtain a culture today although I am actually to do it on the right medial leg as this seems to be the most inflamed area today where there is some questionable drainage as well. I am going to remove some of the calcium deposits which are loosening obtain a deeper culture from this area. Fortunately I do not see any evidence of active infection systemically which is good news but locally I think we are having a bigger issue here. He does have his appointment next Thursday with vascular. Patient has also been  referred to [facility] for further evaluation and treatment of the calcinosis for possible sulfate injections. Objective Constitutional Obese and well-hydrated in [name] acute distress. Vitals Time Taken: 9:59 AM, Temperature: 98.3 F, Pulse: 65 bpm, Respiratory Rate: 18 breaths/min, Blood Pressure: 170/71 mmHg. Respiratory normal breathing without difficulty. Psychiatric this patient is able to make decisions and demonstrates good insight into disease process. Alert and Oriented x 3. pleasant and cooperative. General Notes: Upon evaluation today patient had purulent drainage coming from the right medial lower extremity. Unfortunately this does seem to be a significant issue there is erythema in the leg as well as well as the dorsal surface of the foot which is also an issue at this point. I did perform debridement using pickups and scissors to remove some of the calcium deposits that were very loose on the medial leg of the right side. Removing these I was able to get a lot of the space cleared out for [place] to be able to put some of the calcium alginate into the wound also was able to obtain a deeper wound culture which I think should be more appropriate as well. Integumentary (Hair, Skin) Wound #1 status is Open. Original cause of wound was Gradually Appeared. The date acquired was: 12/07/2021. The wound has been in treatment 2 weeks. The wound is located on the Left,Dorsal Foot. The wound measures 2.4cm length x 2.4cm width x 0.5cm depth; 4.524cm^2 area and 2.262cm^3 volume. There is Fat Layer (Subcutaneous Tissue) exposed. There is [name] tunneling or undermining noted. There is a large amount of serosanguineous drainage noted. The wound margin is well defined and not attached to the wound base. There is small (1-33%) red, pink granulation within the wound bed. There is a large (67-100%) amount of necrotic tissue within the wound bed including Adherent Slough. Wound #2 status is Open. Original  cause of wound was Gradually Appeared. The date acquired was: 11/02/2021. The wound has been in treatment 2 weeks. The wound is located on the Right,Lateral Foot. The wound measures 2.1cm length x 1.4cm width x 0.1cm depth; 2.309cm^2 area and 0.231cm^3 volume. There is Fat Layer (Subcutaneous Tissue) exposed. There is [name] tunneling or undermining noted. There is a medium amount of serosanguineous drainage noted. The wound margin is flat and intact. There is  small (1-33%) pink granulation within the wound bed. There is a large (67-100%) amount of necrotic tissue within the wound bed including Adherent Slough. Wound #3 status is Open. Original cause of wound was Gradually Appeared. The date acquired was: 12/07/2021. The wound has been in treatment 2 weeks. The wound is located on the Right,Medial Lower Leg. The wound measures 2.6cm length x 3.9cm width x 0.6cm depth; 7.964cm^2 area and 4.778cm^3 volume. There is bone and Fat Layer (Subcutaneous Tissue) exposed. There is [name] tunneling or undermining noted. There is a medium amount of serosanguineous drainage noted. Foul odor after cleansing was noted. The wound margin is distinct with the outline attached to the wound base. There is small (1-33%) red granulation within the wound bed. There is a large (67-100%) amount of necrotic tissue within the wound bed including Eschar and Adherent Slough. Wound #4 status is Open. Original cause of wound was Gradually Appeared. The date acquired was: 02/17/2022. The wound has been in treatment 1 weeks. The wound is located on the Right,Medial Foot. The wound measures 0.2cm length x 0.5cm width x 0.1cm depth; 0.079cm^2 area and 0.008cm^3 volume. There is Fat Layer (Subcutaneous Tissue) exposed. There is [name] tunneling or undermining noted. There is a medium amount of serosanguineous drainage noted. The wound margin is flat and intact. There is medium (34-66%) red granulation within the wound bed. There is a medium (34-66%)  amount of necrotic tissue within the wound bed including Adherent Slough. 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 There was a Selective/Open Wound Non-Viable Tissue Debridement with a total area of 10.14 sq cm performed by Worthy Keeler, PA. With the following instrument(s): Forceps to remove Non-Viable tissue/material. Material removed includes Other: subcutaneous calcium deposit after achieving pain control using Lidocaine 5% topical ointment. 1 specimen was taken by a Tissue Culture and sent to the lab per facility protocol. A time out was conducted at 10:34, prior to the start of the procedure. A Minimum amount of bleeding was controlled with Pressure. The procedure was tolerated well with a pain level of 0 throughout and a pain level of 0 following the procedure. Post Debridement Measurements: 2.6cm length x 3.9cm width x 0.6cm depth; 4.778cm^3 volume. Character of Wound/Ulcer Post Debridement is improved. Post procedure Diagnosis Wound #3: Same as Pre-Procedure Plan Follow-up Appointments: Return Appointment in 1 week. - Dr. [name] Rm 1 Wed. 8/9 @ 10:30 am Bathing/ Shower/ Hygiene: May shower with protection but do not get wound dressing(s) wet. Edema Control - Lymphedema / SCD / Other: Lymphedema Pumps. Use Lymphedema pumps on leg(s) 2-3 times a day for 45-60 minutes. If wearing any wraps or  hose, do not remove them. Continue exercising as instructed. - use 1-2 times per day over wraps Elevate legs to the level of the heart or above for 30 minutes daily and/or when sitting, a frequency of: Exercise regularly The following medication(s) was prescribed: Bactrim DS oral 800 mg-160 mg tablet 1 1 tablet oral taken 2 times per day for 10  days. Do not take potassium with this medication starting 02/24/2022 WOUND #1: - Foot Wound Laterality: Dorsal, Left Peri-Wound Care: Sween Lotion (Moisturizing lotion) 1 x Per Week/ Discharge Instructions: Apply moisturizing lotion as directed Topical: Gentamicin 1 x Per Week/ Discharge Instructions: As directed by physician Prim Dressing: KerraCel Ag Gelling Fiber Dressing, 4x5 in (silver alginate) 1 x Per Week/ ary Discharge Instructions: Apply silver alginate to wound bed as instructed Secondary Dressing: ABD Pad, 8x10 1 x Per Week/ Discharge Instructions: Apply over primary dressing as directed. Secondary Dressing: Woven Gauze Sponge, Non-Sterile 4x4 in 1 x Per Week/ Discharge Instructions: Apply over primary dressing as directed. Com pression Wrap: ThreePress (3 layer compression wrap) 1 x Per Week/ Discharge Instructions: Apply three layer compression as directed. WOUND #2: - Foot Wound Laterality: Right, Lateral Peri-Wound Care: Sween Lotion (Moisturizing lotion) 1 x Per Week/ Discharge Instructions: Apply moisturizing lotion as directed Topical: Gentamicin 1 x Per Week/ Discharge Instructions: As directed by physician Prim Dressing: KerraCel Ag Gelling Fiber Dressing, 4x5 in (silver alginate) 1 x Per Week/ ary Discharge Instructions: Apply silver alginate to wound bed as instructed Secondary Dressing: ABD Pad, 8x10 1 x Per Week/ Discharge Instructions: Apply over primary dressing as directed. Secondary Dressing: Woven Gauze Sponge, Non-Sterile 4x4 in 1 x Per Week/ Discharge Instructions: Apply over primary dressing as directed. Com  pression Wrap: ThreePress (3 layer compression wrap) 1 x Per Week/ Discharge Instructions: Apply three layer compression as directed. WOUND #3: - Lower Leg Wound Laterality: Right, Medial Peri-Wound Care: Sween Lotion (Moisturizing lotion) 1 x Per Week/ Discharge Instructions: Apply moisturizing lotion as directed Topical: Gentamicin 1 x Per Week/ Discharge Instructions: As directed by physician Prim Dressing: KerraCel Ag Gelling Fiber Dressing, 4x5 in (silver alginate) 1 x Per Week/ ary Discharge Instructions: Apply silver alginate to wound bed as instructed Secondary Dressing: ABD Pad, 8x10 1 x Per Week/ Discharge Instructions: Apply over primary dressing as directed. Secondary Dressing: Woven Gauze Sponge, Non-Sterile 4x4 in 1 x Per Week/ Discharge Instructions: Apply over primary dressing as directed. Com pression Wrap: ThreePress (3 layer compression wrap) 1 x Per Week/ Discharge Instructions: Apply three layer compression as directed. WOUND #4: - Foot Wound Laterality: Right, Medial Peri-Wound Care: Sween Lotion (Moisturizing lotion) 1 x Per Week/ Discharge Instructions: Apply moisturizing lotion as directed Topical: Gentamicin 1 x Per Week/ Discharge Instructions: As directed by physician Prim Dressing: KerraCel Ag Gelling Fiber Dressing, 4x5 in (silver alginate) 1 x Per Week/ ary Discharge Instructions: Apply silver alginate to wound bed as instructed Secondary Dressing: ABD Pad, 8x10 1 x Per Week/ Discharge Instructions: Apply over primary dressing as directed. Secondary Dressing: Woven Gauze Sponge, Non-Sterile 4x4 in 1 x Per Week/ Discharge Instructions: Apply over primary dressing as directed. Com pression Wrap: ThreePress (3 layer compression wrap) 1 x Per Week/ Discharge Instructions: Apply three layer compression as directed. 1. Would recommend currently that we going continue with the recommendation for wound care measures as before with regard to the silver alginate I  think this is still good. 2. I am also going to send in a prescription for Bactrim DS for the patient as well based on what I am seeing. I do believe that he needs something oral on top of the topical. He is in agreement with the plan. I prescribed this for short amount of time. I also am going to have him avoid the potassium while he takes this medication as it can cause increased potassium buildup in combination with his  Zestril. He takes a potassium supplement anyway. We will see patient back for reevaluation in 1 week here in the clinic. If anything worsens or changes patient will contact our office for additional recommendations. Electronic Signature(s) Signed: 02/24/2022 10:47:45 AM By: Worthy Keeler PA-C Entered By: Worthy Keeler on 02/24/2022 10:47:45 -------------------------------------------------------------------------------- SuperBill Details Patient Name: Date of Service: KEANTE, URIZAR 02/24/2022 Medical Record Number: [id number] Patient Account Number: [id number] Date of Birth/Sex: Treating RN: [birth year] (71 y.o. 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) I10 Essential (primary) hypertension I25.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 I89.0 Lymphedema, not elsewhere classified Z86.718 Personal 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 Facility Procedures CPT4 Code: [id number] Description: [phone] - DEBRIDE WOUND 1ST 20 SQ CM OR < ICD-10 Diagnosis Description  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 Modifier: ed Quantity: 1 Physician Procedures : CPT4 Code Description Modifier [id number] - WC PHYS LEVEL 4 - EST PT 25 ICD-10 Diagnosis Description 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d L97.522 Non-pressure chronic ulcer of other part of left  foot with fat layer exposed 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) I10 Essential (primary) hypertension Quantity: 1 : [phone] [id number] - WC PHYS DEBR WO ANESTH 20 SQ CM ICD-10 Diagnosis Description U23.536 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d Quantity: 1 Electronic Signature(s) Signed: 02/24/2022 10:49:53 AM By: Worthy Keeler PA-C Entered By: Worthy Keeler on 02/24/2022 10:49:52

## 2022-02-24 NOTE — Progress Notes (Signed)
SENA, CLOUATRE ([id number]) Visit Report for 02/24/2022 Arrival Information Details Patient Name: Date of Service: [name], [name] 02/24/2022 10:00 [place] Record Number: [id number] Patient Account Number: [id number] Date of Birth/Sex: Treating RN: January 30, 1951 (71 y.o. Janyth Contes Primary Care Aliza Moret: PA [name], NO Other Clinician: Referring Milka Windholz: Treating Jaycey Gens/Extender: Alvina Chou Weeks in Treatment: 2 Visit Information History Since Last Visit Added or deleted any medications: No Patient Arrived: Wheel Chair Any new allergies or adverse reactions: No Arrival Time: 09:59 Had a fall or experienced change in No Accompanied By: wife activities of daily living that may affect Transfer Assistance: None risk of falls: Patient Identification Verified: Yes Signs or symptoms of abuse/neglect since last visito No Secondary Verification Process Completed: Yes Hospitalized since last visit: No Patient Requires Transmission-Based Precautions: No Implantable device outside of the clinic excluding No Patient Has Alerts: No cellular tissue based products placed in the center since last visit: Has Dressing in Place as Prescribed: Yes Has Compression in Place as Prescribed: Yes Pain Present Now: No Electronic Signature(s) Signed: 02/24/2022 5:11:01 PM By: Adline Peals Entered By: Adline Peals on 02/24/2022 09:59:56 -------------------------------------------------------------------------------- Encounter Discharge Information Details Patient Name: Date of Service: [name] 02/24/2022 10:00 [place] Record Number: [id number] Patient Account Number: [id number] Date of Birth/Sex: Treating RN: February 28, 1951 (71 y.o. Janyth Contes Primary Care Candiace West: PA [name], NO Other Clinician: Referring Torrence Hammack: Treating Macario Shear/Extender: Alvina Chou Weeks in Treatment: 2 Encounter Discharge Information Items Post Procedure  Vitals Discharge Condition: Stable Temperature (F): 98.3 Ambulatory Status: Wheelchair Pulse (bpm): 65 Discharge Destination: Home Respiratory Rate (breaths/min): 18 Transportation: Private Auto Blood Pressure (mmHg): 170/71 Accompanied By: wife Schedule Follow-up Appointment: Yes Clinical Summary of Care: Patient Declined Electronic Signature(s) Signed: 02/24/2022 5:11:01 PM By: Adline Peals Entered By: Adline Peals on 02/24/2022 13:18:24 -------------------------------------------------------------------------------- Lower Extremity Assessment Details Patient Name: Date of Service: [name], [name] 02/24/2022 10:00 [place] Record Number: [id number] Patient Account Number: [id number] Date of Birth/Sex: Treating RN: 1950/12/23 (71 y.o. Janyth Contes Primary Care Sheala Dosh: PA [name], NO Other Clinician: Referring Esvin Hnat: Treating Amma Crear/Extender: Alvina Chou Weeks in Treatment: 2 Edema Assessment Assessed: [Left: No] [Right: No] Edema: [Left: Yes] [Right: Yes] Calf Left: Right: Point of Measurement: From Medial Instep 45.5 cm 44.2 cm Ankle Left: Right: Point of Measurement: From Medial Instep 26.4 cm 27 cm Vascular Assessment Pulses: Dorsalis Pedis Palpable: [Left:Yes] [Right:Yes] Electronic Signature(s) Signed: 02/24/2022 5:11:01 PM By: Adline Peals Entered By: Adline Peals on 02/24/2022 10:08:41 -------------------------------------------------------------------------------- Multi-Disciplinary Care Plan Details Patient Name: Date of Service: [name] 02/24/2022 10:00 [place] Record Number: [id number] Patient Account Number: [id number] Date of Birth/Sex: Treating RN: 1951/01/19 (71 y.o. Janyth Contes Primary Care Chrisy Hillebrand: PA [name], NO Other Clinician: Referring Johnpaul Gillentine: Treating Tesneem Dufrane/Extender: Marcine Matar in Treatment: 2 Multidisciplinary Care Plan reviewed with  physician Active Inactive Venous Leg Ulcer Nursing Diagnoses: Knowledge deficit related to disease process and management Potential for venous Insuffiency (use before diagnosis confirmed) Goals: Patient will maintain optimal edema control Date Initiated: 02/17/2022 Target Resolution Date: 03/10/2022 Goal Status: Active Interventions: Assess peripheral edema status every visit. Compression as ordered Provide education on venous insufficiency Treatment Activities: Non-invasive vascular studies : 02/17/2022 Therapeutic compression applied : 02/17/2022 Notes: Wound/Skin Impairment Nursing Diagnoses: Impaired tissue integrity Knowledge deficit related to ulceration/compromised skin integrity Goals: Patient/caregiver will verbalize understanding of skin care regimen Date Initiated: 02/17/2022 Target Resolution Date: 03/10/2022 Goal Status:  Active Ulcer/skin breakdown will have a volume reduction of 30% by week 4 Date Initiated: 02/17/2022 Target Resolution Date: 03/10/2022 Goal Status: Active Interventions: Assess patient/caregiver ability to obtain necessary supplies Assess patient/caregiver ability to perform ulcer/skin care regimen upon admission and as needed Assess ulceration(s) every visit Provide education on ulcer and skin care Treatment Activities: Skin care regimen initiated : 02/17/2022 Topical wound management initiated : 02/17/2022 Notes: Electronic Signature(s) Signed: 02/24/2022 5:11:01 PM By: Adline Peals Entered By: Adline Peals on 02/24/2022 10:12:04 -------------------------------------------------------------------------------- Pain Assessment Details Patient Name: Date of Service: [name] 02/24/2022 10:00 [place] Record Number: [id number] Patient Account Number: [id number] Date of Birth/Sex: Treating RN: [birth year] (71 y.o. Janyth Contes Primary Care Sallie Maker: PA [name], NO Other Clinician: Referring Enedelia Martorelli: Treating  Ameia Morency/Extender: Alvina Chou Weeks in Treatment: 2 Active Problems Location of Pain Severity and Description of Pain Patient Has Paino No Site Locations Rate the pain. Rate the pain. Current Pain Level: 0 Pain Management and Medication Current Pain Management: Electronic Signature(s) Signed: 02/24/2022 5:11:01 PM By: Adline Peals Entered By: Adline Peals on 02/24/2022 10:00:18 -------------------------------------------------------------------------------- Patient/Caregiver Education Details Patient Name: Date of Service: [name], [name] 8/2/2023andnbsp10:00 A [place] Medical Record Number: [id number] Patient Account Number: [id number] Date of Birth/Gender: Treating RN: 1951/06/30 (71 y.o. Janyth Contes Primary Care Physician: PA [name], NO Other Clinician: Referring Physician: Treating Physician/Extender: Marcine Matar in Treatment: 2 Education Assessment Education Provided To: Patient Education Topics Provided Wound/Skin Impairment: Methods: Explain/Verbal Responses: Reinforcements needed, State content correctly Electronic Signature(s) Signed: 02/24/2022 5:11:01 PM By: Adline Peals Entered By: Adline Peals on 02/24/2022 10:12:15 -------------------------------------------------------------------------------- Wound Assessment Details Patient Name: Date of Service: [name], [name] 02/24/2022 10:00 [place] Record Number: [id number] Patient Account Number: [id number] Date of Birth/Sex: Treating RN: 1951-06-23 (71 y.o. Janyth Contes Primary Care Sidharth Leverette: PA [name], NO Other Clinician: Referring Caytlyn Evers: Treating Lavena Loretto/Extender: Alvina Chou Weeks in Treatment: 2 Wound Status Wound Number: 1 Primary Lymphedema Etiology: Wound Location: Left, Dorsal Foot Wound Open Wounding Event: Gradually Appeared Status: Date Acquired: 12/07/2021 Comorbid Cataracts, Anemia, Lymphedema,  Congestive Heart Failure, Deep Weeks Of Treatment: 2 History: Vein Thrombosis, Hypertension, Peripheral Venous Disease Clustered Wound: No Photos Wound Measurements Length: (cm) 2.4 Width: (cm) 2.4 Depth: (cm) 0.5 Area: (cm) 4.524 Volume: (cm) 2.262 % Reduction in Area: -19.3% % Reduction in Volume: -49.1% Epithelialization: None Tunneling: No Undermining: No Wound Description Classification: Full Thickness Without Exposed Support Structures Wound Margin: Well defined, not attached Exudate Amount: Large Exudate Type: Serosanguineous Exudate Color: red, brown Foul Odor After Cleansing: No Slough/Fibrino Yes Wound Bed Granulation Amount: Small (1-33%) Exposed Structure Granulation Quality: Red, Pink Fascia Exposed: No Necrotic Amount: Large (67-100%) Fat Layer (Subcutaneous Tissue) Exposed: Yes Necrotic Quality: Adherent Slough Tendon Exposed: No Muscle Exposed: No Joint Exposed: No Bone Exposed: No Treatment Notes Wound #1 (Foot) Wound Laterality: Dorsal, Left Cleanser Peri-Wound Care Sween Lotion (Moisturizing lotion) Discharge Instruction: Apply moisturizing lotion as directed Topical Gentamicin Discharge Instruction: As directed by physician Primary Dressing KerraCel Ag Gelling Fiber Dressing, 4x5 in (silver alginate) Discharge Instruction: Apply silver alginate to wound bed as instructed Secondary Dressing ABD Pad, 8x10 Discharge Instruction: Apply over primary dressing as directed. Woven Gauze Sponge, Non-Sterile 4x4 in Discharge Instruction: Apply over primary dressing as directed. Secured With Compression Wrap ThreePress (3 layer compression wrap) Discharge Instruction: Apply three layer compression as directed. Compression Stockings Add-Ons Electronic Signature(s) Signed: 02/24/2022 5:11:01 PM By: Adline Peals Signed:  02/24/2022 6:08:09 PM By: Deon Pilling RN, BSN Entered By: Deon Pilling on 02/24/2022  10:13:56 -------------------------------------------------------------------------------- Wound Assessment Details Patient Name: Date of Service: MCGUIRE, [name] 02/24/2022 10:00 [place] Record Number: [id number] Patient Account Number: [id number] Date of Birth/Sex: Treating RN: [birth year] (71 y.o. Janyth Contes Primary Care Merisa Julio: PA [name], NO Other Clinician: Referring Cordie Buening: Treating Bohdi Leeds/Extender: Alvina Chou Weeks in Treatment: 2 Wound Status Wound Number: 2 Primary Lymphedema Etiology: Wound Location: Right, Lateral Foot Wound Open Wounding Event: Gradually Appeared Status: Date Acquired: 11/02/2021 Comorbid Cataracts, Anemia, Lymphedema, Congestive Heart Failure, Deep Weeks Of Treatment: 2 History: Vein Thrombosis, Hypertension, Peripheral Venous Disease Clustered Wound: No Photos Wound Measurements Length: (cm) 2.1 Width: (cm) 1.4 Depth: (cm) 0.1 Area: (cm) 2.309 Volume: (cm) 0.231 % Reduction in Area: 43.5% % Reduction in Volume: 43.4% Epithelialization: Small (1-33%) Tunneling: No Undermining: No Wound Description Classification: Full Thickness Without Exposed Support Structures Wound Margin: Flat and Intact Exudate Amount: Medium Exudate Type: Serosanguineous Exudate Color: red, brown Foul Odor After Cleansing: No Slough/Fibrino Yes Wound Bed Granulation Amount: Small (1-33%) Exposed Structure Granulation Quality: Pink Fascia Exposed: No Necrotic Amount: Large (67-100%) Fat Layer (Subcutaneous Tissue) Exposed: Yes Necrotic Quality: Adherent Slough Tendon Exposed: No Muscle Exposed: No Joint Exposed: No Bone Exposed: No Treatment Notes Wound #2 (Foot) Wound Laterality: Right, Lateral Cleanser Peri-Wound Care Sween Lotion (Moisturizing lotion) Discharge Instruction: Apply moisturizing lotion as directed Topical Gentamicin Discharge Instruction: As directed by physician Primary Dressing KerraCel Ag Gelling  Fiber Dressing, 4x5 in (silver alginate) Discharge Instruction: Apply silver alginate to wound bed as instructed Secondary Dressing ABD Pad, 8x10 Discharge Instruction: Apply over primary dressing as directed. Woven Gauze Sponge, Non-Sterile 4x4 in Discharge Instruction: Apply over primary dressing as directed. Secured With Compression Wrap ThreePress (3 layer compression wrap) Discharge Instruction: Apply three layer compression as directed. Compression Stockings Add-Ons Electronic Signature(s) Signed: 02/24/2022 5:11:01 PM By: Adline Peals Signed: 02/24/2022 6:08:09 PM By: Deon Pilling RN, BSN Entered By: Deon Pilling on 02/24/2022 10:14:16 -------------------------------------------------------------------------------- Wound Assessment Details Patient Name: Date of Service: [name] 02/24/2022 10:00 [place] Record Number: [id number] Patient Account Number: [id number] Date of Birth/Sex: Treating RN: May 11, 1951 (71 y.o. Janyth Contes Primary Care Cha Gomillion: PA [name], NO Other Clinician: Referring Hendrixx Severin: Treating Alson Mcpheeters/Extender: Alvina Chou Weeks in Treatment: 2 Wound Status Wound Number: 3 Primary Lymphedema Etiology: Wound Location: Right, Medial Lower Leg Wound Open Wounding Event: Gradually Appeared Status: Date Acquired: 12/07/2021 Comorbid Cataracts, Anemia, Lymphedema, Congestive Heart Failure, Deep Weeks Of Treatment: 2 History: Vein Thrombosis, Hypertension, Peripheral Venous Disease Clustered Wound: No Photos Wound Measurements Length: (cm) 2.6 Width: (cm) 3.9 Depth: (cm) 0.6 Area: (cm) 7.964 Volume: (cm) 4.778 % Reduction in Area: -49.1% % Reduction in Volume: -123.7% Epithelialization: Small (1-33%) Tunneling: No Undermining: No Wound Description Classification: Full Thickness With Exposed Support Structures Wound Margin: Distinct, outline attached Exudate Amount: Medium Exudate Type:  Serosanguineous Exudate Color: red, brown Foul Odor After Cleansing: Yes Due to Product Use: No Slough/Fibrino Yes Wound Bed Granulation Amount: Small (1-33%) Exposed Structure Granulation Quality: Red Fascia Exposed: No Necrotic Amount: Large (67-100%) Fat Layer (Subcutaneous Tissue) Exposed: Yes Necrotic Quality: Eschar, Adherent Slough Tendon Exposed: No Muscle Exposed: No Joint Exposed: No Bone Exposed: Yes Treatment Notes Wound #3 (Lower Leg) Wound Laterality: Right, Medial Cleanser Peri-Wound Care Sween Lotion (Moisturizing lotion) Discharge Instruction: Apply moisturizing lotion as directed Topical Gentamicin Discharge Instruction: As directed by physician Primary Dressing KerraCel Ag Heidi Dach  Fiber Dressing, 4x5 in (silver alginate) Discharge Instruction: Apply silver alginate to wound bed as instructed Secondary Dressing ABD Pad, 8x10 Discharge Instruction: Apply over primary dressing as directed. Woven Gauze Sponge, Non-Sterile 4x4 in Discharge Instruction: Apply over primary dressing as directed. Secured With Compression Wrap ThreePress (3 layer compression wrap) Discharge Instruction: Apply three layer compression as directed. Compression Stockings Add-Ons Electronic Signature(s) Signed: 02/24/2022 5:11:01 PM By: Adline Peals Signed: 02/24/2022 6:08:09 PM By: Deon Pilling RN, BSN Entered By: Deon Pilling on 02/24/2022 10:14:34 -------------------------------------------------------------------------------- Wound Assessment Details Patient Name: Date of Service: [name] 02/24/2022 10:00 [place] Record Number: [id number] Patient Account Number: [id number] Date of Birth/Sex: Treating RN: Aug 23, 1950 (71 y.o. Janyth Contes Primary Care Sharmin Foulk: PA [name], NO Other Clinician: Referring Maiyah Goyne: Treating Merri Dimaano/Extender: Alvina Chou Weeks in Treatment: 2 Wound Status Wound Number: 4 Primary  Lymphedema Etiology: Wound Location: Right, Medial Foot Wound Open Wounding Event: Gradually Appeared Status: Date Acquired: 02/17/2022 Comorbid Cataracts, Anemia, Lymphedema, Congestive Heart Failure, Deep Weeks Of Treatment: 1 History: Vein Thrombosis, Hypertension, Peripheral Venous Disease Clustered Wound: No Photos Wound Measurements Length: (cm) 0.2 Width: (cm) 0.5 Depth: (cm) 0.1 Area: (cm) 0.079 Volume: (cm) 0.008 % Reduction in Area: 74.8% % Reduction in Volume: 74.2% Epithelialization: Large (67-100%) Tunneling: No Undermining: No Wound Description Classification: Full Thickness Without Exposed Support Structures Wound Margin: Flat and Intact Exudate Amount: Medium Exudate Type: Serosanguineous Exudate Color: red, brown Foul Odor After Cleansing: No Slough/Fibrino Yes Wound Bed Granulation Amount: Medium (34-66%) Exposed Structure Granulation Quality: Red Fascia Exposed: No Necrotic Amount: Medium (34-66%) Fat Layer (Subcutaneous Tissue) Exposed: Yes Necrotic Quality: Adherent Slough Tendon Exposed: No Muscle Exposed: No Joint Exposed: No Bone Exposed: No Treatment Notes Wound #4 (Foot) Wound Laterality: Right, Medial Cleanser Peri-Wound Care Sween Lotion (Moisturizing lotion) Discharge Instruction: Apply moisturizing lotion as directed Topical Gentamicin Discharge Instruction: As directed by physician Primary Dressing KerraCel Ag Gelling Fiber Dressing, 4x5 in (silver alginate) Discharge Instruction: Apply silver alginate to wound bed as instructed Secondary Dressing ABD Pad, 8x10 Discharge Instruction: Apply over primary dressing as directed. Woven Gauze Sponge, Non-Sterile 4x4 in Discharge Instruction: Apply over primary dressing as directed. Secured With Compression Wrap ThreePress (3 layer compression wrap) Discharge Instruction: Apply three layer compression as directed. Compression Stockings Add-Ons Electronic Signature(s) Signed:  02/24/2022 5:11:01 PM By: Adline Peals Signed: 02/24/2022 6:08:09 PM By: Deon Pilling RN, BSN Entered By: Deon Pilling on 02/24/2022 10:14:54 -------------------------------------------------------------------------------- Vitals Details Patient Name: Date of Service: [name] 02/24/2022 10:00 [place] Record Number: [id number] Patient Account Number: [id number] Date of Birth/Sex: Treating RN: 1951-05-29 (71 y.o. Janyth Contes Primary Care Omare Bilotta: PA [name], NO Other Clinician: Referring Jeriah Skufca: Treating Patryce Depriest/Extender: Alvina Chou Weeks in Treatment: 2 Vital Signs Time Taken: 09:59 Temperature (F): 98.3 Pulse (bpm): 65 Respiratory Rate (breaths/min): 18 Blood Pressure (mmHg): 170/71 Reference Range: 80 - 120 mg / dl Electronic Signature(s) Signed: 02/24/2022 5:11:01 PM By: Adline Peals Entered By: Adline Peals on 02/24/2022 10:00:12

## 2022-02-25 ENCOUNTER — Other Ambulatory Visit (HOSPITAL_COMMUNITY): Payer: Self-pay | Admitting: General Surgery

## 2022-02-25 DIAGNOSIS — L97812 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d: Secondary | ICD-10-CM

## 2022-03-03 ENCOUNTER — Other Ambulatory Visit: Payer: Self-pay

## 2022-03-03 ENCOUNTER — Inpatient Hospital Stay: Payer: Medicare Other | Attending: Physician Assistant | Admitting: Physician Assistant

## 2022-03-03 ENCOUNTER — Encounter: Payer: Self-pay | Admitting: Physician Assistant

## 2022-03-03 ENCOUNTER — Inpatient Hospital Stay: Payer: Medicare Other

## 2022-03-03 ENCOUNTER — Encounter (HOSPITAL_BASED_OUTPATIENT_CLINIC_OR_DEPARTMENT_OTHER): Payer: Medicare Other | Admitting: General Surgery

## 2022-03-03 VITALS — BP 152/71 | HR 63 | Temp 97.7°F | Resp 18 | Wt 280.6 lb

## 2022-03-03 DIAGNOSIS — Z86718 Personal history of other venous thrombosis and embolism: Secondary | ICD-10-CM | POA: Diagnosis not present

## 2022-03-03 DIAGNOSIS — Z7901 Long term (current) use of anticoagulants: Secondary | ICD-10-CM | POA: Insufficient documentation

## 2022-03-03 DIAGNOSIS — E782 Mixed hyperlipidemia: Secondary | ICD-10-CM

## 2022-03-03 DIAGNOSIS — G729 Myopathy, unspecified: Secondary | ICD-10-CM

## 2022-03-03 DIAGNOSIS — I10 Essential (primary) hypertension: Secondary | ICD-10-CM

## 2022-03-03 DIAGNOSIS — E785 Hyperlipidemia, unspecified: Secondary | ICD-10-CM

## 2022-03-03 LAB — CBC WITH DIFFERENTIAL (CANCER CENTER ONLY)
Abs Immature Granulocytes: 0.01 10*3/uL (ref 0.00–0.07)
Basophils Absolute: 0 10*3/uL (ref 0.0–0.1)
Basophils Relative: 0 %
Eosinophils Absolute: 0.3 10*3/uL (ref 0.0–0.5)
Eosinophils Relative: 6 %
HCT: 36.8 % — ABNORMAL LOW (ref 39.0–52.0)
Hemoglobin: 12.2 g/dL — ABNORMAL LOW (ref 13.0–17.0)
Immature Granulocytes: 0 %
Lymphocytes Relative: 14 %
Lymphs Abs: 0.7 10*3/uL (ref 0.7–4.0)
MCH: 33.3 pg (ref 26.0–34.0)
MCHC: 33.2 g/dL (ref 30.0–36.0)
MCV: 100.5 fL — ABNORMAL HIGH (ref 80.0–100.0)
Monocytes Absolute: 0.7 10*3/uL (ref 0.1–1.0)
Monocytes Relative: 14 %
Neutro Abs: 3.4 10*3/uL (ref 1.7–7.7)
Neutrophils Relative %: 66 %
Platelet Count: 104 10*3/uL — ABNORMAL LOW (ref 150–400)
RBC: 3.66 MIL/uL — ABNORMAL LOW (ref 4.22–5.81)
RDW: 14.4 % (ref 11.5–15.5)
Smear Review: NORMAL
WBC Count: 5.2 10*3/uL (ref 4.0–10.5)
nRBC: 0 % (ref 0.0–0.2)

## 2022-03-03 LAB — CMP (CANCER CENTER ONLY)
ALT: 15 U/L (ref 0–44)
AST: 19 U/L (ref 15–41)
Albumin: 3.7 g/dL (ref 3.5–5.0)
Alkaline Phosphatase: 47 U/L (ref 38–126)
Anion gap: 4 — ABNORMAL LOW (ref 5–15)
BUN: 18 mg/dL (ref 8–23)
CO2: 30 mmol/L (ref 22–32)
Calcium: 9 mg/dL (ref 8.9–10.3)
Chloride: 104 mmol/L (ref 98–111)
Creatinine: 1.06 mg/dL (ref 0.61–1.24)
GFR, Estimated: >60 mL/min (ref >60)
Glucose, Bld: 88 mg/dL (ref 70–99)
Potassium: 5.1 mmol/L (ref 3.5–5.1)
Sodium: 138 mmol/L (ref 135–145)
Total Bilirubin: 0.5 mg/dL (ref 0.3–1.2)
Total Protein: 6.9 g/dL (ref 6.5–8.1)

## 2022-03-03 MED ORDER — LISINOPRIL 10 MG PO TABS: 10.0000 mg | ORAL_TABLET | Freq: Every day | ORAL | 3 refills | Status: DC

## 2022-03-03 NOTE — Progress Notes (Signed)
[place] [phone]   Fax:([phone]) [place] NOTE  Patient Care Team: Patient, No Pcp Per as PCP - General (General Practice) [name], MD as PCP - Cardiology (Cardiology) [name], MD as Consulting Physician (Cardiology) Harriett Sine, MD as Consulting Physician (Dermatology)  Hematological/Oncological History 1) 01/02/2021: Presented to ED in [place] due to swelling and redness of the legs x 8 days.  Doppler US revealed nonocclusive deep venous thrombus involving the left common femoral vein, proximal femoral vein and popliteal vein extending to the calf trifurcation. Stared Eliquis therapy  2) 01/29/2022: Hypercoagulable panel ordered and did not show underlying clotting disorder.   3) 03/03/2022: Establish care with [facility] Hematology with Dr. Narda Rutherford and Dede Query PA-C  CHIEF COMPLAINTS/PURPOSE OF CONSULTATION:  Hx of DVT currently on Eliquis  HISTORY OF PRESENTING ILLNESS:  [name] 71 y.o. adult with medical history significant for CAD, HTN, hyperlipidemia and myositis presents to the hematology clinic for evaluation due to history of DVT diagnosed in September 2022. He is unaccompanied for this visit.   On exam today, [name] reports that he is tolerating the Eliquis without any prohibitive toxicities. He reports that swelling in his left lower extremity did improve after starting Eliquis therapy. He has lower extremity venous stasis with ulcerations. He is under the care of wound care. He adds that he is more sedentary in the past year and uses a wheelchair for ambulation today.   He denies any appetite or weight changes. He denies GI symptoms including nausea, vomiting, diarrhea or constipation. He denies easy bruising or signs of active bleeding. He denies fevers, chills, night sweats, shortness of breath, chest pain or cough. He has no other complaints. Rest of the 10 point ROS is below.    MEDICAL HISTORY:  Past  Medical History:  Diagnosis Date   Adenomatous colon polyp - diminutive 06/2019   no recall needed   Coronary artery disease    a. s/p BMS to LAD (1998) b. s/p DES to RCA (06/24/14)   Dyspnea    HTN (hypertension)    Hyperlipidemia    Myositis    chronic CK elevation ( 415 CK, ALL MM CK subtype, seen by Rheumatology and considered normal Variant)   Palatal fistula    Palpable abd. aorta    Pneumonia    RBBB    Spinal stenosis    Dr Vaughan Sine  surgery   Weakness     SURGICAL HISTORY: Past Surgical History:  Procedure Laterality Date   CERVICAL FUSION  09/2009   CORONARY ANGIOPLASTY     CORONARY STENT PLACEMENT  06/24/2014   DES to RCA   [place] WITH CORONARY ANGIOGRAM N/A 06/24/2014   Procedure: LEFT HEART CATHETERIZATION WITH CORONARY ANGIOGRAM;  Surgeon: Troy Sine, MD;  Location[facility] C[facility] LAB;  Service: Cardiovascular;  Laterality: N/A;   LUMBAR SPINE SURGERY  5/13   MUSCLE BIOPSY Left 07/11/2018   Procedure: Left Vastus lateralus muscle biopsy;  Surgeon: Judith Part, MD;  Location: [place];  Service: Neurosurgery;  Laterality: Left;  Left Vastus lateralus muscle biopsy   PERCUTANEOUS CORONARY STENT INTERVENTION (PCI-S)  06/24/2014   Procedure: PERCUTANEOUS CORONARY STENT INTERVENTION (PCI-S);  Surgeon: Troy Sine, MD;  Location: [facility] CATH LAB;  Service: Cardiovascular;;  Distal RCA    SOCIAL HISTORY: Social History   Socioeconomic History  Marital status: Married    Spouse name: Not on file   Number of children: Not on file   Years of education: Not on file   Highest education level: Not on file  Occupational History   Not on file  Tobacco Use   Smoking status: Former    Packs/day: 0.75    Years: 23.00    Total pack years: 17.25    Types: Cigarettes    Quit date: 07/26/1989    Years since quitting: 32.6   Smokeless tobacco: Never  Vaping Use   Vaping Use: Never used  Substance  and Sexual Activity   Alcohol use: Yes    Comment: 3-6 cans per week   Drug use: No   Sexual activity: Yes    Birth control/protection: None  Other Topics Concern   Not on file  Social History Narrative   Married, wife moving to [place]   Retired , prior occupation self   Patient is a former smoker   Alcohol use - yes   Regular exercise   Drug Use- no    caffeine 3 cups coffee daily   Social Determinants of Radio broadcast assistant Strain: Not on file  Food Insecurity: Not on file  Transportation Needs: Not on file  Physical Activity: Not on file  Stress: Not on file  Social Connections: Not on file  Intimate Partner Violence: Not on file    FAMILY HISTORY: Family History  Problem Relation Age of Onset   Heart attack Mother    Cancer Mother        esophageal   Aneurysm Father    Heart disease Father    Cystic fibrosis Brother        no prior health issues, rare form of cystic fibrosis of the lungs (aug 2016)   Stroke Neg Hx     ALLERGIES:  has No Known Allergies.  MEDICATIONS:  Current Outpatient Medications  Medication Sig Dispense Refill   acetaminophen (TYLENOL) 500 MG tablet Take by mouth as needed for moderate pain or mild pain.     apixaban (ELIQUIS) 5 MG TABS tablet Take 1 tablet by mouth twice daily 180 tablet 1   atenolol (TENORMIN) 25 MG tablet Take 1 tablet by mouth twice daily 180 tablet 0   b complex vitamins tablet Take 1 tablet by mouth daily.     betamethasone dipropionate (DIPROLENE) 0.05 % cream Apply 1 application topically every other day.     cholecalciferol (VITAMIN D) 1000 units tablet Take 1,000 Units by mouth daily.     Coenzyme Q10 (CO Q 10) 100 MG CAPS Take 100 mg by mouth daily.      diphenhydrAMINE (BENADRYL) 25 MG tablet Take 50 mg by mouth at bedtime.     ezetimibe (ZETIA) 10 MG tablet Take 1 tablet (10 mg total) by mouth daily. 90 tablet 3   folic acid (FOLVITE) 761 MCG tablet Take 800 mcg by mouth daily.      furosemide (LASIX) 40  MG tablet TAKE 1 TABLET BY MOUTH FOR 3 DAYS THEN TAKE ONLY AS NEEDED FOR EDEMA 90 tablet 3   glucosamine-chondroitin 500-400 MG tablet Take 2 tablets by mouth daily.      ibuprofen (ADVIL,MOTRIN) 800 MG tablet Take 800 mg by mouth daily.      Krill Oil 500 MG CAPS Take 500 mg by mouth daily.      levofloxacin (LEVAQUIN) 750 MG tablet Take 750 mg by mouth daily.     Melatonin  10 MG TABS Take 20 mg by mouth at bedtime.     potassium chloride (K-DUR) 10 MEQ tablet TAKE 1 TABLET BY MOUTH FOR 3 DAYS THEN TAKE ONLY AS NEEDED WHEN YOU TAKE A LASIX 90 tablet 3   rosuvastatin (CRESTOR) 5 MG tablet Take 1 tablet (5 mg total) by mouth daily. 90 tablet 0   Saw Palmetto, Serenoa repens, (SAW PALMETTO PO) Take 300 mg by mouth daily.     sulfamethoxazole-trimethoprim (BACTRIM DS) 800-160 MG tablet Take 1 tablet by mouth 2 (two) times daily.     triamcinolone cream (KENALOG) 0.1 % Apply 1 application topically as directed. 30 g 3   lisinopril (ZESTRIL) 10 MG tablet Take 1 tablet (10 mg total) by mouth daily. 90 tablet 3   No current facility-administered medications for this visit.    REVIEW OF SYSTEMS:   Constitutional: ( - ) fevers, ( - )  chills , ( - ) night sweats Eyes: ( - ) blurriness of vision, ( - ) double vision, ( - ) watery eyes Ears, nose, mouth, throat, and face: ( - ) mucositis, ( - ) sore throat Respiratory: ( - ) cough, ( - ) dyspnea, ( - ) wheezes Cardiovascular: ( - ) palpitation, ( - ) chest discomfort, ( + ) lower extremity swelling Gastrointestinal:  ( - ) nausea, ( - ) heartburn, ( - ) change in bowel habits Skin: ( - ) abnormal skin rashes Lymphatics: ( - ) new lymphadenopathy, ( - ) easy bruising Neurological: ( - ) numbness, ( - ) tingling, ( - ) new weaknesses Behavioral/Psych: ( - ) mood change, ( - ) new changes  All other systems were reviewed with the patient and are negative.  PHYSICAL EXAMINATION: ECOG PERFORMANCE STATUS: 1 - Symptomatic but completely  ambulatory  Vitals:   03/03/22 1110  BP: (!) 152/71  Pulse: 63  Resp: 18  Temp: 97.7 F (36.5 C)  SpO2: 96%   Filed Weights   03/03/22 1110  Weight: 280 lb 9.6 oz (127.3 kg)    GENERAL: well appearing male in NAD  SKIN: skin color, texture, turgor are normal, no rashes or significant lesions EYES: conjunctiva are pink and non-injected, sclera clear OROPHARYNX: no exudate, no erythema; lips, buccal mucosa, and tongue normal  NECK: supple, non-tender LYMPH:  no palpable lymphadenopathy in the cervical, axillary or supraclavicular lymph nodes.  LUNGS: clear to auscultation and percussion with normal breathing effort HEART: regular rate & rhythm and no murmurs. Bilateral lower extremity edema, wrapped so unable to assess ulcerations.  ABDOMEN: soft, non-tender, non-distended, normal bowel sounds Musculoskeletal: no cyanosis of digits and no clubbing  PSYCH: alert & oriented x 3, fluent speech NEURO: no focal motor/sensory deficits  LABORATORY DATA:  I have reviewed the data as listed    Latest Ref Rng & Units 03/03/2022   12:19 PM 12/31/2020    8:56 AM 07/05/2019   12:12 PM  CBC  WBC 4.0 - 10.5 K/uL 5.2  6.1  5.6   Hemoglobin 13.0 - 17.0 g/dL 12.2  12.4  13.5   Hematocrit 39.0 - 52.0 % 36.8  37.2  39.5   Platelets 150 - 400 K/uL 104  160  128        Latest Ref Rng & Units 03/03/2022   12:19 PM 12/31/2020    8:56 AM 02/22/2020   12:07 PM  CMP  Glucose 70 - 99 mg/dL 88  110  81   BUN 8 - 23 mg/dL  $'18  25  25   'o$ Creatinine 0.61 - 1.24 mg/dL 1.06  0.91  1.08   Sodium 135 - 145 mmol/L 138  142  142   Potassium 3.5 - 5.1 mmol/L 5.1  4.4  4.6   Chloride 98 - 111 mmol/L 104  106  103   CO2 22 - 32 mmol/L '30  22  25   '$ Calcium 8.9 - 10.3 mg/dL 9.0  8.9  9.3   Total Protein 6.5 - 8.1 g/dL 6.9  5.9    Total Bilirubin 0.3 - 1.2 mg/dL 0.5  0.3    Alkaline Phos 38 - 126 U/L 47  69    AST 15 - 41 U/L 19  45    ALT 0 - 44 U/L 15  54      RADIOGRAPHIC STUDIES: I have personally  reviewed the radiological images as listed and agreed with the findings in the report. DG Tibia/Fibula Right  Result Date: 02/19/2022 CLINICAL DATA:  Wound.  Ulceration. EXAM: RIGHT TIBIA AND FIBULA - 2 VIEW COMPARISON:  None Available. FINDINGS: There are diffuse soft tissue calcifications in the mid and distal aspect of the lower extremity. There is some soft tissue swelling anterior to the mid and distal tibia. There is no acute fracture or dislocation identified. No obvious cortical erosions are identified, but evaluation is limited secondary to marked soft tissue calcifications. Mild degenerative changes affect the ankle and knee. IMPRESSION: 1. No definite acute osseous abnormality. 2. Soft tissue swelling of the ankle and distal lower extremity. 3. Marked soft tissue calcifications in the mid and distal lower extremity of uncertain etiology. Please correlate clinically. Electronically Signed   By: Ronney Asters M.D.   On: 02/19/2022 23:16    ASSESSMENT & PLAN [name] is a 71 y.o. adult who presents to the clinic for evaluation of history of left lower extremity DVT. Patient was diagnosed in June 2022 and has been on Eliquis therapy without any toxicities. We reviewed possible provoking factors including prolonged travel/immobility, surgery (particular abdominal or orthropedic), trauma,  and pregnancy/ estrogen containing birth control. After a detailed history and review of the records there is no clear provoking factor for this patient's VTE.  Patients with unprovoked VTEs have up to 25% recurrence after 5 years and 36% at 10 years, with 4% of these clots being fatal (BMJ [phone]). Therefore the formal recommendation for unprovoked VTE's is lifelong anticoagulation, as the cause may not be transient or reversible. We recommend 6 months or full strength anticoagulation with a re-evaluation after that time.  The patient's will then have a choice of maintenance dose DOAC (preferred,  recommended), '81mg'$  ASA PO daily (non-preferred), or no further anticoagulation (not recommended).   #Unprovoked DVT/Pulmonary Embolism --findings at this time are consistent with a unprovoked VTE --will order baseline CMP and CBC to assure labs are adequate for DOAC therapy --currently on Eliquis 5 mg PO twice daily. Recommend to transition to maintenance dose at 2.5 mg twice daily.  --patient denies any bleeding, bruising, or dark stools on this medication. It is well tolerated. No difficulties accessing/affording the medication --RTC in 6 months' time with strict return precautions for overt signs of bleeding.    Orders Placed This Encounter  Procedures   CBC with Differential ([place] Only)    Standing Status:   Future    Number of Occurrences:   1    Standing Expiration Date:   03/04/2023   CMP ([place] only)    Standing  Status:   Future    Number of Occurrences:   1    Standing Expiration Date:   03/04/2023    All questions were answered. The patient knows to call the clinic with any problems, questions or concerns.  I have spent a total of 60 minutes minutes of face-to-face and non-face-to-face time, preparing to see the patient, obtaining and/or reviewing separately obtained history, performing a medically appropriate examination, counseling and educating the patient, ordering medications, documenting clinical information in the electronic health record and care coordination.   Dede Query, PA-C Department of Hematology/Oncology [place] at [facility] Phone: [phone]  Patient was seen with Dr. Lorenso Courier.   I have read the above note and personally examined the patient. I agree with the assessment and plan as noted above.  Briefly [name] is a 71 year old male who presents for evaluation of an unprovoked VTE.  The patient had an unprovoked VTE in June 2022.  He is continued on Eliquis therapy without difficulty since that time.  The  medication is not cost inhibitive.  He presents today for discussion of duration of anticoagulation.  Given the unprovoked nature of this VTE would recommend indefinite anticoagulation moving forward.  Patient has voiced his understanding of the plan moving forward   Ledell Peoples, MD Department of Hematology/Oncology [place] at [facility] Phone: [phone] Pager: [phone] Email: [email or web address]

## 2022-03-04 ENCOUNTER — Other Ambulatory Visit: Payer: Self-pay

## 2022-03-04 ENCOUNTER — Ambulatory Visit (HOSPITAL_COMMUNITY)
Admission: RE | Admit: 2022-03-04 | Discharge: 2022-03-04 | Disposition: A | Discharge status: Home / Self Care | Payer: Medicare Other | Source: Ambulatory Visit | Attending: Cardiology | Admitting: Cardiology

## 2022-03-04 ENCOUNTER — Encounter (HOSPITAL_BASED_OUTPATIENT_CLINIC_OR_DEPARTMENT_OTHER): Payer: Medicare Other | Admitting: General Surgery

## 2022-03-04 ENCOUNTER — Telehealth: Payer: Self-pay | Admitting: Physician Assistant

## 2022-03-04 DIAGNOSIS — Z86718 Personal history of other venous thrombosis and embolism: Secondary | ICD-10-CM | POA: Insufficient documentation

## 2022-03-04 DIAGNOSIS — S80811A Abrasion, right lower leg, initial encounter: Secondary | ICD-10-CM | POA: Diagnosis not present

## 2022-03-04 DIAGNOSIS — L97522 Non-pressure chronic ulcer of other part of left foot with fat layer exposed: Secondary | ICD-10-CM | POA: Diagnosis not present

## 2022-03-04 DIAGNOSIS — I890 Lymphedema, not elsewhere classified: Secondary | ICD-10-CM | POA: Diagnosis not present

## 2022-03-04 DIAGNOSIS — L97812 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d: Secondary | ICD-10-CM | POA: Insufficient documentation

## 2022-03-04 DIAGNOSIS — I82409 Acute embolism and thrombosis of unspecified deep veins of unspecified lower extremity: Secondary | ICD-10-CM

## 2022-03-04 DIAGNOSIS — I110 Hypertensive heart disease with heart failure: Secondary | ICD-10-CM | POA: Diagnosis not present

## 2022-03-04 DIAGNOSIS — L97512 Non-pressure chronic ulcer of other part of right foot with fat layer exposed: Secondary | ICD-10-CM | POA: Diagnosis not present

## 2022-03-04 DIAGNOSIS — I251 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: Secondary | ICD-10-CM | POA: Diagnosis not present

## 2022-03-04 DIAGNOSIS — I872 Venous insufficiency (chronic) (peripheral): Secondary | ICD-10-CM | POA: Diagnosis not present

## 2022-03-04 MED ORDER — APIXABAN 2.5 MG PO TABS: 2.5000 mg | ORAL_TABLET | Freq: Two times a day (BID) | ORAL | 5 refills | Status: DC

## 2022-03-04 NOTE — Progress Notes (Signed)
HERCULES, HASLER ([id number]) Visit Report for 03/04/2022 Arrival Information Details Patient Name: Date of Service: [name], [name] 03/04/2022 2:00 PM Medical Record Number: [id number] Patient Account Number: [id number] Date of Birth/Sex: Treating RN: January 07, 1951 (71 y.o. [name] Primary Care Leotis Isham: PA [name], [name] Other Clinician: Referring Anais Koenen: Treating Celia Gibbons/Extender: [name] in Treatment: 3 Visit Information History Since Last Visit Added or deleted any medications: No Patient Arrived: Wheel Chair Any new allergies or adverse reactions: No Arrival Time: 14:02 Had a fall or experienced change in No Accompanied By: self activities of daily living that may affect Transfer Assistance: None risk of falls: Patient Identification Verified: Yes Signs or symptoms of abuse/neglect since last visito No Patient Requires Transmission-Based Precautions: No Hospitalized since last visit: No Patient Has Alerts: Yes Implantable device outside of the clinic excluding No Patient Alerts: R ABI= N/C, TBI= .75 cellular tissue based products placed in the center L ABI =N/C, TBI = .57 since last visit: Has Dressing in Place as Prescribed: Yes Pain Present Now: No Electronic Signature(s) Signed: 03/04/2022 5:20:00 PM By: Baruch Gouty RN, BSN Entered By: Baruch Gouty on 03/04/2022 16:51:37 -------------------------------------------------------------------------------- Compression Therapy Details Patient Name: Date of Service: [name] 03/04/2022 2:00 PM Medical Record Number: [id number] Patient Account Number: [id number] Date of Birth/Sex: Treating RN: [birth year] (71 y.o. [name] Primary Care Natnael Biederman: PA [name], [name] Other Clinician: Referring Kallen Delatorre: Treating Cashawn Yanko/Extender: [name] in Treatment: 3 Compression Therapy Performed for Wound Assessment: Wound #1 Left,Dorsal Foot Performed By: Clinician  Dellie Catholic, RN Compression Type: Three Layer Post Procedure Diagnosis Same as Pre-procedure Electronic Signature(s) Signed: 03/04/2022 5:28:45 PM By: Dellie Catholic RN Entered By: Dellie Catholic on 03/04/2022 17:28:09 -------------------------------------------------------------------------------- Compression Therapy Details Patient Name: Date of Service: [name], [name] 03/04/2022 2:00 PM Medical Record Number: [id number] Patient Account Number: [id number] Date of Birth/Sex: Treating RN: 11-13-1950 (71 y.o. [name] Primary Care Koralyn Prestage: PA [name], [name] Other Clinician: Referring Mekiah Cambridge: Treating Pearl Berlinger/Extender: [name] in Treatment: 3 Compression Therapy Performed for Wound Assessment: Wound #2 Right,Lateral Foot Performed By: Clinician Dellie Catholic, RN Compression Type: Three Layer Post Procedure Diagnosis Same as Pre-procedure Electronic Signature(s) Signed: 03/04/2022 5:28:45 PM By: Dellie Catholic RN Entered By: Dellie Catholic on 03/04/2022 17:28:09 -------------------------------------------------------------------------------- Compression Therapy Details Patient Name: Date of Service: [name], [name] 03/04/2022 2:00 PM Medical Record Number: [id number] Patient Account Number: [id number] Date of Birth/Sex: Treating RN: 1951/03/19 (71 y.o. [name] Primary Care Esley Brooking: PA [name], [name] Other Clinician: Referring Keithan Dileonardo: Treating Sayde Lish/Extender: [name] in Treatment: 3 Compression Therapy Performed for Wound Assessment: Wound #3 Right,Medial Lower Leg Performed By: Clinician Dellie Catholic, RN Compression Type: Three Layer Post Procedure Diagnosis Same as Pre-procedure Electronic Signature(s) Signed: 03/04/2022 5:28:45 PM By: Dellie Catholic RN Entered By: Dellie Catholic on 03/04/2022  17:28:09 -------------------------------------------------------------------------------- Compression Therapy Details Patient Name: Date of Service: [name], [name] 03/04/2022 2:00 PM Medical Record Number: [id number] Patient Account Number: [id number] Date of Birth/Sex: Treating RN: 18-Apr-1951 (71 y.o. [name] Primary Care Alvenia Treese: PA [name], [name] Other Clinician: Referring Andrell Bergeson: Treating Lonnell Chaput/Extender: [name] in Treatment: 3 Compression Therapy Performed for Wound Assessment: Wound #4 Right,Medial Foot Performed By: Clinician Dellie Catholic, RN Compression Type: Three Layer Post Procedure Diagnosis Same as Pre-procedure Electronic Signature(s) Signed: 03/04/2022 5:28:45 PM By: Dellie Catholic RN Signed: 03/04/2022 5:28:45 PM By: Dellie Catholic RN Entered By: Dellie Catholic on 03/04/2022 17:28:09 -------------------------------------------------------------------------------- Compression Therapy  Details Patient Name: Date of Service: [name], [name] 03/04/2022 2:00 PM Medical Record Number: [id number] Patient Account Number: [id number] Date of Birth/Sex: Treating RN: 1951/05/19 (71 y.o. [name] Primary Care Kensi Karr: PA [name], [name] Other Clinician: Referring Milagros Middendorf: Treating Severa Jeremiah/Extender: [name] in Treatment: 3 Compression Therapy Performed for Wound Assessment: Wound #5 Right,Anterior Lower Leg Performed By: Clinician Dellie Catholic, RN Compression Type: Three Layer Post Procedure Diagnosis Same as Pre-procedure Electronic Signature(s) Signed: 03/04/2022 5:28:45 PM By: Dellie Catholic RN Entered By: Dellie Catholic on 03/04/2022 17:28:09 -------------------------------------------------------------------------------- Encounter Discharge Information Details Patient Name: Date of Service: [name] 03/04/2022 2:00 PM Medical Record Number: [id number] Patient Account Number:  [id number] Date of Birth/Sex: Treating RN: 02-21-1951 (71 y.o. [name] Primary Care Eleonore Shippee: PA [name], [name] Other Clinician: Referring Margan Elias: Treating Kameah Rawl/Extender: [name] in Treatment: 3 Encounter Discharge Information Items Post Procedure Vitals Discharge Condition: Stable Temperature (F): 98.3 Ambulatory Status: Wheelchair Pulse (bpm): 66 Discharge Destination: Home Respiratory Rate (breaths/min): 18 Transportation: Private Auto Blood Pressure (mmHg): 127/53 Accompanied By: self Schedule Follow-up Appointment: Yes Clinical Summary of Care: Patient Declined Electronic Signature(s) Signed: 03/04/2022 5:22:54 PM By: Dellie Catholic RN Entered By: Dellie Catholic on 03/04/2022 17:22:12 -------------------------------------------------------------------------------- Lower Extremity Assessment Details Patient Name: Date of Service: [name], [name] 03/04/2022 2:00 PM Medical Record Number: [id number] Patient Account Number: [id number] Date of Birth/Sex: Treating RN: 08-02-1950 (71 y.o. [name] Primary Care Katelee Schupp: PA [name], [name] Other Clinician: Referring Cash Meadow: Treating Michaelanthony Kempton/Extender: Valeria Batman Weeks in Treatment: 3 Edema Assessment Assessed: [Left: No] [Right: No] Edema: [Left: Yes] [Right: Yes] Calf Left: Right: Point of Measurement: From Medial Instep 44.9 cm 46.7 cm Ankle Left: Right: Point of Measurement: From Medial Instep 26 cm 27 cm Electronic Signature(s) Signed: 03/04/2022 5:22:54 PM By: Dellie Catholic RN Entered By: Dellie Catholic on 03/04/2022 14:14:11 -------------------------------------------------------------------------------- Multi Wound Chart Details Patient Name: Date of Service: [name] 03/04/2022 2:00 PM Medical Record Number: [id number] Patient Account Number: [id number] Date of Birth/Sex: Treating RN: 03/30/1951 (71 y.o. M) Primary Care Devona Holmes: PA [name],  NO Other Clinician: Referring Miosha Behe: Treating Sunni Richardson/Extender: [name], [name] in Treatment: 3 Vital Signs Height(in): Pulse(bpm): 58 Weight(lbs): Blood Pressure(mmHg): 127/53 Body Mass Index(BMI): Temperature(F): 98.3 Respiratory Rate(breaths/min): 18 Photos: Left, Dorsal Foot Right, Lateral Foot Right, Medial Lower Leg Wound Location: Gradually Appeared Gradually Appeared Gradually Appeared Wounding Event: Lymphedema Lymphedema Lymphedema Primary Etiology: Cataracts, Anemia, Lymphedema, Cataracts, Anemia, Lymphedema, Cataracts, Anemia, Lymphedema, Comorbid History: Congestive Heart Failure, Deep Vein Congestive Heart Failure, Deep Vein Congestive Heart Failure, Deep Vein Thrombosis, Hypertension, Peripheral Thrombosis, Hypertension, Peripheral Thrombosis, Hypertension, Peripheral Venous Disease Venous Disease Venous Disease 12/07/2021 11/02/2021 12/07/2021 Date Acquired: '3 3 3 '$ Weeks of Treatment: Open Open Open Wound Status: No No No Wound Recurrence: 2.5x2.5x0.4 2.1x1.1x0.1 3.2x4x0.7 Measurements L x W x D (cm) 4.909 1.814 10.053 A (cm) : rea 1.963 0.181 7.037 Volume (cm) : -29.40% 55.60% -88.20% % Reduction in Area: -29.40% 55.60% -229.40% % Reduction in Volume: Full Thickness Without Exposed Full Thickness Without Exposed Full Thickness With Exposed Support Classification: Support Structures Support Structures Structures Large Medium Medium Exudate Amount: Serosanguineous Serosanguineous Serosanguineous Exudate Type: red, brown red, brown red, brown Exudate Color: Well defined, not attached Flat and Intact Distinct, outline attached Wound Margin: Small (1-33%) Small (1-33%) Small (1-33%) Granulation Amount: Red, Pink Pink Red Granulation Quality: Large (67-100%) Large (67-100%) Large (67-100%) Necrotic Amount: Adherent Cisco Necrotic  Tissue: Fat Layer (Subcutaneous Tissue):  Yes Fat Layer (Subcutaneous Tissue): Yes Fat Layer (Subcutaneous Tissue): Yes Exposed Structures: Fascia: No Fascia: No Fascia: No Tendon: No Tendon: No Tendon: No Muscle: No Muscle: No Muscle: No Joint: No Joint: No Joint: No Bone: No Bone: No Bone: No None Small (1-33%) Small (1-33%) Epithelialization: Debridement - Excisional Debridement - Selective/Open Wound Debridement - Excisional Debridement: Pre-procedure Verification/Time Out 14:45 14:45 14:45 Taken: Lidocaine 5% topical ointment Lidocaine 5% topical ointment Lidocaine 5% topical ointment Pain Control: Subcutaneous, Psychologist, prison and probation services, [place]ough Other, Subcutaneous, [employer] Tissue Debrided: Skin/Subcutaneous Tissue Non-Viable Tissue Skin/Subcutaneous Tissue Level: 6.25 2.31 12.8 Debridement A (sq cm): rea Curette Curette Curette Instrument: Minimum Minimum Minimum Bleeding: Pressure Pressure Pressure Hemostasis A chieved: 0 0 0 Procedural Pain: 0 0 0 Post Procedural Pain: Procedure was tolerated well Procedure was tolerated well Procedure was tolerated well Debridement Treatment Response: 2.5x2.5x0.4 2.1x1.1x0.1 3.2x4x0.7 Post Debridement Measurements L x W x D (cm) 1.963 0.181 7.037 Post Debridement Volume: (cm) Debridement Debridement Debridement Procedures Performed: Wound Number: 4 5 N/A Photos: N/A Right, Medial Foot Right, Anterior Lower Leg N/A Wound Location: Gradually Appeared Gradually Appeared N/A Wounding Event: Lymphedema Abrasion N/A Primary Etiology: Cataracts, Anemia, Lymphedema, Cataracts, Anemia, Lymphedema, N/A Comorbid History: Congestive Heart Failure, Deep Vein Congestive Heart Failure, Deep Vein Thrombosis, Hypertension, Peripheral Thrombosis, Hypertension, Peripheral Venous Disease Venous Disease 02/17/2022 03/03/2022 N/A Date Acquired: 2 0 N/A Weeks of Treatment: Open Open N/A Wound Status: No No N/A Wound Recurrence: 0.2x0.5x0.1 1.1x0.2x0.1 N/A Measurements L x  W x D (cm) 0.079 0.173 N/A A (cm) : rea 0.008 0.017 N/A Volume (cm) : 74.80% 0.00% N/A % Reduction in A rea: 74.20% 0.00% N/A % Reduction in Volume: Full Thickness Without Exposed Full Thickness Without Exposed N/A Classification: Support Structures Support Structures Medium Medium N/A Exudate A mount: Serosanguineous Serosanguineous N/A Exudate Type: red, brown red, brown N/A Exudate Color: Flat and Intact N/A N/A Wound Margin: Medium (34-66%) Large (67-100%) N/A Granulation A mount: Red Red N/A Granulation Quality: Medium (34-66%) Small (1-33%) N/A Necrotic A mount: Eschar, Adherent Slough Eschar, Adherent Slough N/A Necrotic Tissue: Fat Layer (Subcutaneous Tissue): Yes Fat Layer (Subcutaneous Tissue): Yes N/A Exposed Structures: Fascia: No Fascia: No Tendon: No Tendon: No Muscle: No Muscle: No Joint: No Joint: No Bone: No Bone: No Large (67-100%) Small (1-33%) N/A Epithelialization: Debridement - Selective/Open Wound Debridement - Selective/Open Wound N/A Debridement: Pre-procedure Verification/Time Out 14:45 14:45 N/A Taken: Lidocaine 5% topical ointment Lidocaine 5% topical ointment N/A Pain Control: Necrotic/Eschar Necrotic/Eschar N/A Tissue Debrided: Non-Viable Tissue Non-Viable Tissue N/A Level: 0.1 0.22 N/A Debridement A (sq cm): rea Curette Curette N/A Instrument: Minimum Minimum N/A Bleeding: Pressure Pressure N/A Hemostasis A chieved: 0 0 N/A Procedural Pain: 0 0 N/A Post Procedural Pain: Procedure was tolerated well Procedure was tolerated well N/A Debridement Treatment Response: 0.2x0.5x0.1 1.1x0.2x0.1 N/A Post Debridement Measurements L x W x D (cm) 0.008 0.017 N/A Post Debridement Volume: (cm) Debridement Debridement N/A Procedures Performed: Treatment Notes Electronic Signature(s) Signed: 03/04/2022 3:27:02 PM By: Fredirick Maudlin MD FACS Entered By: Fredirick Maudlin on 03/04/2022  15:27:01 -------------------------------------------------------------------------------- Multi-Disciplinary Care Plan Details Patient Name: Date of Service: [name] 03/04/2022 2:00 PM Medical Record Number: [id number] Patient Account Number: [id number] Date of Birth/Sex: Treating RN: [birth year] (71 y.o. [name] Primary Care Ananda Caya: PA [name], [name] Other Clinician: Referring Yoltzin Barg: Treating Millissa Deese/Extender: [name] in Treatment: 3 Multidisciplinary Care Plan reviewed with physician Active Inactive Venous Leg Ulcer Nursing Diagnoses: Knowledge deficit  related to disease process and management Potential for venous Insuffiency (use before diagnosis confirmed) Goals: Patient will maintain optimal edema control Date Initiated: 02/17/2022 Target Resolution Date: 03/10/2022 Goal Status: Active Interventions: Assess peripheral edema status every visit. Compression as ordered Provide education on venous insufficiency Treatment Activities: Non-invasive vascular studies : 02/17/2022 Therapeutic compression applied : 02/17/2022 Notes: Wound/Skin Impairment Nursing Diagnoses: Impaired tissue integrity Knowledge deficit related to ulceration/compromised skin integrity Goals: Patient/caregiver will verbalize understanding of skin care regimen Date Initiated: 02/17/2022 Target Resolution Date: 03/10/2022 Goal Status: Active Ulcer/skin breakdown will have a volume reduction of 30% by week 4 Date Initiated: 02/17/2022 Target Resolution Date: 03/10/2022 Goal Status: Active Interventions: Assess patient/caregiver ability to obtain necessary supplies Assess patient/caregiver ability to perform ulcer/skin care regimen upon admission and as needed Assess ulceration(s) every visit Provide education on ulcer and skin care Treatment Activities: Skin care regimen initiated : 02/17/2022 Topical wound management initiated : 02/17/2022 Notes: Electronic  Signature(s) Signed: 03/04/2022 5:22:54 PM By: Dellie Catholic RN Entered By: Dellie Catholic on 03/04/2022 15:00:19 -------------------------------------------------------------------------------- Pain Assessment Details Patient Name: Date of Service: [name], [name] 03/04/2022 2:00 PM Medical Record Number: [id number] Patient Account Number: [id number] Date of Birth/Sex: Treating RN: May 10, 1951 (71 y.o. [name] Primary Care Wesly Whisenant: PA [name], [name] Other Clinician: Referring Gavriella Hearst: Treating Kostantinos Tallman/Extender: [name] in Treatment: 3 Active Problems Location of Pain Severity and Description of Pain Patient Has Paino No Site Locations Pain Management and Medication Current Pain Management: Electronic Signature(s) Signed: 03/04/2022 5:22:54 PM By: Dellie Catholic RN Entered By: Dellie Catholic on 03/04/2022 14:05:41 -------------------------------------------------------------------------------- Patient/Caregiver Education Details Patient Name: Date of Service: [name] 8/10/2023andnbsp2:00 PM Medical Record Number: [id number] Patient Account Number: [id number] Date of Birth/Gender: Treating RN: 1950-08-17 (71 y.o. [name] Primary Care Physician: PA [name], [name] Other Clinician: Referring Physician: Treating Physician/Extender: [name] in Treatment: 3 Education Assessment Education Provided To: Patient Education Topics Provided Wound/Skin Impairment: Methods: Explain/Verbal Responses: Return demonstration correctly Electronic Signature(s) Signed: 03/04/2022 5:22:54 PM By: Dellie Catholic RN Entered By: Dellie Catholic on 03/04/2022 15:00:32 -------------------------------------------------------------------------------- Wound Assessment Details Patient Name: Date of Service: [name], [name] 03/04/2022 2:00 PM Medical Record Number: [id number] Patient Account Number: [id number] Date of  Birth/Sex: Treating RN: [birth year] (71 y.o. [name] Primary Care Xavyer Steenson: PA [name], [name] Other Clinician: Referring Dera Vanaken: Treating Ava Deguire/Extender: [name] in Treatment: 3 Wound Status Wound Number: 1 Primary Lymphedema Etiology: Wound Location: Left, Dorsal Foot Wound Open Wounding Event: Gradually Appeared Status: Date Acquired: 12/07/2021 Comorbid Cataracts, Anemia, Lymphedema, Congestive Heart Failure, Deep Weeks Of Treatment: 3 History: Vein Thrombosis, Hypertension, Peripheral Venous Disease Clustered Wound: No Photos Wound Measurements Length: (cm) 2.5 Width: (cm) 2.5 Depth: (cm) 0.4 Area: (cm) 4.909 Volume: (cm) 1.963 % Reduction in Area: -29.4% % Reduction in Volume: -29.4% Epithelialization: None Tunneling: No Undermining: No Wound Description Classification: Full Thickness Without Exposed Support Structures Wound Margin: Well defined, not attached Exudate Amount: Large Exudate Type: Serosanguineous Exudate Color: red, brown Foul Odor After Cleansing: No Slough/Fibrino Yes Wound Bed Granulation Amount: Small (1-33%) Exposed Structure Granulation Quality: Red, Pink Fascia Exposed: No Necrotic Amount: Large (67-100%) Fat Layer (Subcutaneous Tissue) Exposed: Yes Necrotic Quality: Adherent Slough Tendon Exposed: No Muscle Exposed: No Joint Exposed: No Bone Exposed: No Treatment Notes Wound #1 (Foot) Wound Laterality: Dorsal, Left Cleanser Peri-Wound Care Sween Lotion (Moisturizing lotion) Discharge Instruction: Apply moisturizing lotion as directed Topical Gentamicin Discharge Instruction: As directed by physician Primary Dressing  KerraCel Ag Gelling Fiber Dressing, 4x5 in (silver alginate) Discharge Instruction: Apply silver alginate to wound bed as instructed Optifoam Non-Adhesive Dressing, 4x4 in Discharge Instruction: Apply to wound bed as instructed Secondary Dressing ABD Pad, 8x10 Discharge  Instruction: Apply over primary dressing as directed. Woven Gauze Sponge, Non-Sterile 4x4 in Discharge Instruction: Apply over primary dressing as directed. Secured With Compression Wrap ThreePress (3 layer compression wrap) Discharge Instruction: Apply three layer compression as directed. Compression Stockings Add-Ons Electronic Signature(s) Signed: 03/04/2022 5:22:54 PM By: Dellie Catholic RN Entered By: Dellie Catholic on 03/04/2022 14:29:30 -------------------------------------------------------------------------------- Wound Assessment Details Patient Name: Date of Service: [name], [name] 03/04/2022 2:00 PM Medical Record Number: [id number] Patient Account Number: [id number] Date of Birth/Sex: Treating RN: [birth year] (71 y.o. [name] Primary Care Laquenta Whitsell: PA [name], [name] Other Clinician: Referring Lorre Opdahl: Treating Abygail Galeno/Extender: [name] in Treatment: 3 Wound Status Wound Number: 2 Primary Lymphedema Etiology: Wound Location: Right, Lateral Foot Wound Open Wounding Event: Gradually Appeared Status: Date Acquired: 11/02/2021 Comorbid Cataracts, Anemia, Lymphedema, Congestive Heart Failure, Deep Weeks Of Treatment: 3 History: Vein Thrombosis, Hypertension, Peripheral Venous Disease Clustered Wound: No Photos Wound Measurements Length: (cm) 2.1 Width: (cm) 1.1 Depth: (cm) 0.1 Area: (cm) 1.814 Volume: (cm) 0.181 % Reduction in Area: 55.6% % Reduction in Volume: 55.6% Epithelialization: Small (1-33%) Tunneling: No Undermining: No Wound Description Classification: Full Thickness Without Exposed Support Structures Wound Margin: Flat and Intact Exudate Amount: Medium Exudate Type: Serosanguineous Exudate Color: red, brown Foul Odor After Cleansing: No Slough/Fibrino Yes Wound Bed Granulation Amount: Small (1-33%) Exposed Structure Granulation Quality: Pink Fascia Exposed: No Necrotic Amount: Large (67-100%) Fat Layer  (Subcutaneous Tissue) Exposed: Yes Necrotic Quality: Eschar, Adherent Slough Tendon Exposed: No Muscle Exposed: No Joint Exposed: No Bone Exposed: No Treatment Notes Wound #2 (Foot) Wound Laterality: Right, Lateral Cleanser Peri-Wound Care Sween Lotion (Moisturizing lotion) Discharge Instruction: Apply moisturizing lotion as directed Topical Gentamicin Discharge Instruction: As directed by physician Primary Dressing KerraCel Ag Gelling Fiber Dressing, 4x5 in (silver alginate) Discharge Instruction: Apply silver alginate to wound bed as instructed Optifoam Non-Adhesive Dressing, 4x4 in Discharge Instruction: Apply to wound bed as instructed Secondary Dressing ABD Pad, 8x10 Discharge Instruction: Apply over primary dressing as directed. Woven Gauze Sponge, Non-Sterile 4x4 in Discharge Instruction: Apply over primary dressing as directed. Secured With Compression Wrap ThreePress (3 layer compression wrap) Discharge Instruction: Apply three layer compression as directed. Compression Stockings Add-Ons Electronic Signature(s) Signed: 03/04/2022 5:22:54 PM By: Dellie Catholic RN Signed: 03/04/2022 5:22:54 PM By: Dellie Catholic RN Entered By: Dellie Catholic on 03/04/2022 14:31:07 -------------------------------------------------------------------------------- Wound Assessment Details Patient Name: Date of Service: [name], FLAMENCO 03/04/2022 2:00 PM Medical Record Number: [id number] Patient Account Number: [id number] Date of Birth/Sex: Treating RN: 1950-09-11 (71 y.o. [name] Primary Care Margia Wiesen: PA [name], [name] Other Clinician: Referring Louie Meaders: Treating Lorah Kalina/Extender: [name] in Treatment: 3 Wound Status Wound Number: 3 Primary Lymphedema Etiology: Wound Location: Right, Medial Lower Leg Wound Open Wounding Event: Gradually Appeared Status: Date Acquired: 12/07/2021 Comorbid Cataracts, Anemia, Lymphedema, Congestive Heart  Failure, Deep Weeks Of Treatment: 3 History: Vein Thrombosis, Hypertension, Peripheral Venous Disease Clustered Wound: No Photos Wound Measurements Length: (cm) 3.2 Width: (cm) 4 Depth: (cm) 0.7 Area: (cm) 10.053 Volume: (cm) 7.037 % Reduction in Area: -88.2% % Reduction in Volume: -229.4% Epithelialization: Small (1-33%) Tunneling: No Undermining: No Wound Description Classification: Full Thickness With Exposed Support Structures Wound Margin: Distinct, outline attached Exudate Amount: Medium Exudate Type: Serosanguineous Exudate Color: red,  brown Foul Odor After Cleansing: No Slough/Fibrino Yes Wound Bed Granulation Amount: Small (1-33%) Exposed Structure Granulation Quality: Red Fascia Exposed: No Necrotic Amount: Large (67-100%) Fat Layer (Subcutaneous Tissue) Exposed: Yes Necrotic Quality: Eschar, Adherent Slough Tendon Exposed: No Muscle Exposed: No Joint Exposed: No Bone Exposed: No Treatment Notes Wound #3 (Lower Leg) Wound Laterality: Right, Medial Cleanser Peri-Wound Care Sween Lotion (Moisturizing lotion) Discharge Instruction: Apply moisturizing lotion as directed Topical Gentamicin Discharge Instruction: As directed by physician Primary Dressing KerraCel Ag Gelling Fiber Dressing, 4x5 in (silver alginate) Discharge Instruction: Apply silver alginate to wound bed as instructed Optifoam Non-Adhesive Dressing, 4x4 in Discharge Instruction: Apply to wound bed as instructed Secondary Dressing ABD Pad, 8x10 Discharge Instruction: Apply over primary dressing as directed. Woven Gauze Sponge, Non-Sterile 4x4 in Discharge Instruction: Apply over primary dressing as directed. Secured With Compression Wrap ThreePress (3 layer compression wrap) Discharge Instruction: Apply three layer compression as directed. Compression Stockings Add-Ons Electronic Signature(s) Signed: 03/04/2022 5:22:54 PM By: Dellie Catholic RN Entered By: Dellie Catholic on  03/04/2022 14:31:26 -------------------------------------------------------------------------------- Wound Assessment Details Patient Name: Date of Service: HUSAIN, COSTABILE 03/04/2022 2:00 PM Medical Record Number: [id number] Patient Account Number: [id number] Date of Birth/Sex: Treating RN: 1951/02/27 (71 y.o. [name] Primary Care Lin Hackmann: PA [name], [name] Other Clinician: Referring Theressa Piedra: Treating Elford Evilsizer/Extender: [name] in Treatment: 3 Wound Status Wound Number: 4 Primary Lymphedema Etiology: Wound Location: Right, Medial Foot Wound Open Wounding Event: Gradually Appeared Status: Date Acquired: 02/17/2022 Comorbid Cataracts, Anemia, Lymphedema, Congestive Heart Failure, Deep Weeks Of Treatment: 2 History: Vein Thrombosis, Hypertension, Peripheral Venous Disease Clustered Wound: No Photos Wound Measurements Length: (cm) 0.2 Width: (cm) 0.5 Depth: (cm) 0.1 Area: (cm) 0.079 Volume: (cm) 0.008 % Reduction in Area: 74.8% % Reduction in Volume: 74.2% Epithelialization: Large (67-100%) Tunneling: No Undermining: No Wound Description Classification: Full Thickness Without Exposed Support Structu Wound Margin: Flat and Intact Exudate Amount: Medium Exudate Type: Serosanguineous Exudate Color: red, brown res Foul Odor After Cleansing: No Slough/Fibrino Yes Wound Bed Granulation Amount: Medium (34-66%) Exposed Structure Granulation Quality: Red Fascia Exposed: No Necrotic Amount: Medium (34-66%) Fat Layer (Subcutaneous Tissue) Exposed: Yes Necrotic Quality: Eschar, Adherent Slough Tendon Exposed: No Muscle Exposed: No Joint Exposed: No Bone Exposed: No Treatment Notes Wound #4 (Foot) Wound Laterality: Right, Medial Cleanser Peri-Wound Care Sween Lotion (Moisturizing lotion) Discharge Instruction: Apply moisturizing lotion as directed Topical Gentamicin Discharge Instruction: As directed by physician Primary  Dressing KerraCel Ag Gelling Fiber Dressing, 4x5 in (silver alginate) Discharge Instruction: Apply silver alginate to wound bed as instructed Optifoam Non-Adhesive Dressing, 4x4 in Discharge Instruction: Apply to wound bed as instructed Secondary Dressing ABD Pad, 8x10 Discharge Instruction: Apply over primary dressing as directed. Woven Gauze Sponge, Non-Sterile 4x4 in Discharge Instruction: Apply over primary dressing as directed. Secured With Compression Wrap ThreePress (3 layer compression wrap) Discharge Instruction: Apply three layer compression as directed. Compression Stockings Add-Ons Electronic Signature(s) Signed: 03/04/2022 5:22:54 PM By: Dellie Catholic RN Entered By: Dellie Catholic on 03/04/2022 14:31:55 -------------------------------------------------------------------------------- Wound Assessment Details Patient Name: Date of Service: ELIZER, BOSTIC 03/04/2022 2:00 PM Medical Record Number: [id number] Patient Account Number: [id number] Date of Birth/Sex: Treating RN: 1951-05-25 (71 y.o. [name] Primary Care Laytoya Ion: PA [name], [name] Other Clinician: Referring Anner Baity: Treating Zygmunt Mcglinn/Extender: Valeria Batman Weeks in Treatment: 3 Wound Status Wound Number: 5 Primary Abrasion Etiology: Wound Location: Right, Anterior Lower Leg Wound Open Wounding Event: Gradually Appeared Status: Date Acquired: 03/03/2022 Comorbid Cataracts, Anemia, Lymphedema, Congestive  Heart Failure, Deep Weeks Of Treatment: 0 History: Vein Thrombosis, Hypertension, Peripheral Venous Disease Clustered Wound: No Photos Wound Measurements Length: (cm) 1.1 Width: (cm) 0.2 Depth: (cm) 0.1 Area: (cm) 0.173 Volume: (cm) 0.017 % Reduction in Area: 0% % Reduction in Volume: 0% Epithelialization: Small (1-33%) Tunneling: No Undermining: No Wound Description Classification: Full Thickness Without Exposed Support Structures Exudate Amount: Medium Exudate  Type: Serosanguineous Exudate Color: red, brown Foul Odor After Cleansing: No Slough/Fibrino Yes Wound Bed Granulation Amount: Large (67-100%) Exposed Structure Granulation Quality: Red Fascia Exposed: No Necrotic Amount: Small (1-33%) Fat Layer (Subcutaneous Tissue) Exposed: Yes Necrotic Quality: Eschar, Adherent Slough Tendon Exposed: No Muscle Exposed: No Joint Exposed: No Bone Exposed: No Treatment Notes Wound #5 (Lower Leg) Wound Laterality: Right, Anterior Cleanser Peri-Wound Care Sween Lotion (Moisturizing lotion) Discharge Instruction: Apply moisturizing lotion as directed Topical Gentamicin Discharge Instruction: As directed by physician Primary Dressing KerraCel Ag Gelling Fiber Dressing, 4x5 in (silver alginate) Discharge Instruction: Apply silver alginate to wound bed as instructed Optifoam Non-Adhesive Dressing, 4x4 in Discharge Instruction: Apply to wound bed as instructed Secondary Dressing ABD Pad, 8x10 Discharge Instruction: Apply over primary dressing as directed. Woven Gauze Sponge, Non-Sterile 4x4 in Discharge Instruction: Apply over primary dressing as directed. Secured With Compression Wrap ThreePress (3 layer compression wrap) Discharge Instruction: Apply three layer compression as directed. Compression Stockings Add-Ons Electronic Signature(s) Signed: 03/04/2022 5:22:54 PM By: Dellie Catholic RN Entered By: Dellie Catholic on 03/04/2022 14:32:21 -------------------------------------------------------------------------------- Vitals Details Patient Name: Date of Service: [name] 03/04/2022 2:00 PM Medical Record Number: [id number] Patient Account Number: [id number] Date of Birth/Sex: Treating RN: [birth year] (71 y.o. [name] Primary Care Maizey Menendez: PA [name], [name] Other Clinician: Referring Jameelah Watts: Treating Opha Mcghee/Extender: [name] in Treatment: 3 Vital Signs Time Taken: 14:03 Temperature (F):  98.3 Pulse (bpm): 66 Respiratory Rate (breaths/min): 18 Blood Pressure (mmHg): 127/53 Reference Range: 80 - 120 mg / dl Electronic Signature(s) Signed: 03/04/2022 5:22:54 PM By: Dellie Catholic RN Entered By: Dellie Catholic on 03/04/2022 14:05:35

## 2022-03-04 NOTE — Telephone Encounter (Signed)
Per 8/9 los called and tried to reach pt, there was no answer.  Unable to leave message.  Pt is mychart active

## 2022-03-05 NOTE — Progress Notes (Signed)
[name] ([id number]) Visit Report for 03/04/2022 Chief Complaint Document Details Patient Name: Date of Service: [name], [name] 03/04/2022 2:00 PM Medical Record Number: [id number] Patient Account Number: [id number] Date of Birth/Sex: Treating RN: 1951/02/16 (71 y.o. M) Primary Care Provider: PA [name], NO Other Clinician: Referring Provider: Treating Provider/Extender: [name] in Treatment: 3 Information Obtained from: Patient Chief Complaint Patient seen for complaints of Non-Healing Wounds. Electronic Signature(s) Signed: 03/04/2022 3:27:07 PM By: Fredirick Maudlin MD FACS Entered By: Fredirick Maudlin on 03/04/2022 15:27:07 -------------------------------------------------------------------------------- Debridement Details Patient Name: Date of Service: [name] 03/04/2022 2:00 PM Medical Record Number: [id number] Patient Account Number: [id number] Date of Birth/Sex: Treating RN: 11/11/1950 (71 y.o. [name] Primary Care Provider: PA [name], [place] Other Clinician: Referring Provider: Treating Provider/Extender: [name] in Treatment: 3 Debridement Performed for Assessment: Wound #4 Right,Medial Foot Performed By: Physician Fredirick Maudlin, MD Debridement Type: Debridement Level of Consciousness (Pre-procedure): Awake and Alert Pre-procedure Verification/Time Out Yes - 14:45 Taken: Start Time: 14:45 Pain Control: Lidocaine 5% topical ointment T Area Debrided (L x W): otal 0.2 (cm) x 0.5 (cm) = 0.1 (cm) Tissue and other material debrided: Non-Viable, Eschar Level: Non-Viable Tissue Debridement Description: Selective/Open Wound Instrument: Curette Bleeding: Minimum Hemostasis Achieved: Pressure End Time: 14:46 Procedural Pain: 0 Post Procedural Pain: 0 Response to Treatment: Procedure was tolerated well Level of Consciousness (Post- Awake and Alert procedure): Post Debridement Measurements of Total  Wound Length: (cm) 0.2 Width: (cm) 0.5 Depth: (cm) 0.1 Volume: (cm) 0.008 Character of Wound/Ulcer Post Debridement: Improved Post Procedure Diagnosis Same as Pre-procedure Electronic Signature(s) Signed: 03/04/2022 4:47:38 PM By: Fredirick Maudlin MD FACS Signed: 03/04/2022 5:22:54 PM By: Dellie Catholic RN Entered By: Dellie Catholic on 03/04/2022 14:46:55 -------------------------------------------------------------------------------- Debridement Details Patient Name: Date of Service: [name] 03/04/2022 2:00 PM Medical Record Number: [id number] Patient Account Number: [id number] Date of Birth/Sex: Treating RN: 1951-05-23 (71 y.o. [name] Primary Care Provider: PA [name], [place] Other Clinician: Referring Provider: Treating Provider/Extender: [name] in Treatment: 3 Debridement Performed for Assessment: Wound #3 Right,Medial Lower Leg Performed By: Physician Fredirick Maudlin, MD Debridement Type: Debridement Level of Consciousness (Pre-procedure): Awake and Alert Pre-procedure Verification/Time Out Yes - 14:45 Taken: Start Time: 14:45 Pain Control: Lidocaine 5% topical ointment T Area Debrided (L x W): otal 3.2 (cm) x 4 (cm) = 12.8 (cm) Tissue and other material debrided: Non-Viable, Slough, Subcutaneous, Slough, Other: calcium Level: Skin/Subcutaneous Tissue Debridement Description: Excisional Instrument: Curette Bleeding: Minimum Hemostasis Achieved: Pressure End Time: 14:46 Procedural Pain: 0 Post Procedural Pain: 0 Response to Treatment: Procedure was tolerated well Level of Consciousness (Post- Awake and Alert procedure): Post Debridement Measurements of Total Wound Length: (cm) 3.2 Width: (cm) 4 Depth: (cm) 0.7 Volume: (cm) 7.037 Character of Wound/Ulcer Post Debridement: Improved Post Procedure Diagnosis Same as Pre-procedure Electronic Signature(s) Signed: 03/04/2022 4:47:38 PM By: Fredirick Maudlin MD  FACS Signed: 03/04/2022 5:22:54 PM By: Dellie Catholic RN Entered By: Dellie Catholic on 03/04/2022 14:48:22 -------------------------------------------------------------------------------- Debridement Details Patient Name: Date of Service: [name] 03/04/2022 2:00 PM Medical Record Number: [id number] Patient Account Number: [id number] Date of Birth/Sex: Treating RN: [birth year] (71 y.o. [name] Primary Care Provider: PA [name], [place] Other Clinician: Referring Provider: Treating Provider/Extender: [name] in Treatment: 3 Debridement Performed for Assessment: Wound #5 Right,Anterior Lower Leg Performed By: Physician Fredirick Maudlin, MD Debridement Type: Debridement Level of Consciousness (Pre-procedure): Awake and Alert Pre-procedure Verification/Time Out Yes - 14:45  Taken: Start Time: 14:45 Pain Control: Lidocaine 5% topical ointment T Area Debrided (L x W): otal 1.1 (cm) x 0.2 (cm) = 0.22 (cm) Tissue and other material debrided: Non-Viable, Eschar Level: Non-Viable Tissue Debridement Description: Selective/Open Wound Instrument: Curette Bleeding: Minimum Hemostasis Achieved: Pressure End Time: 14:46 Procedural Pain: 0 Post Procedural Pain: 0 Response to Treatment: Procedure was tolerated well Level of Consciousness (Post- Awake and Alert procedure): Post Debridement Measurements of Total Wound Length: (cm) 1.1 Width: (cm) 0.2 Depth: (cm) 0.1 Volume: (cm) 0.017 Character of Wound/Ulcer Post Debridement: Improved Post Procedure Diagnosis Same as Pre-procedure Electronic Signature(s) Signed: 03/04/2022 4:47:38 PM By: Fredirick Maudlin MD FACS Signed: 03/04/2022 5:22:54 PM By: Dellie Catholic RN Entered By: Dellie Catholic on 03/04/2022 14:49:40 -------------------------------------------------------------------------------- Debridement Details Patient Name: Date of Service: [name] 03/04/2022 2:00 PM Medical Record  Number: [id number] Patient Account Number: [id number] Date of Birth/Sex: Treating RN: Jul 18, 1951 (71 y.o. [name] Primary Care Provider: PA [name], [place] Other Clinician: Referring Provider: Treating Provider/Extender: [name] in Treatment: 3 Debridement Performed for Assessment: Wound #2 Right,Lateral Foot Performed By: Physician Fredirick Maudlin, MD Debridement Type: Debridement Level of Consciousness (Pre-procedure): Awake and Alert Pre-procedure Verification/Time Out Yes - 14:45 Taken: Start Time: 14:45 Pain Control: Lidocaine 5% topical ointment T Area Debrided (L x W): otal 2.1 (cm) x 1.1 (cm) = 2.31 (cm) Tissue and other material debrided: Non-Viable, Eschar, Slough, Slough Level: Non-Viable Tissue Debridement Description: Selective/Open Wound Instrument: Curette Bleeding: Minimum Hemostasis Achieved: Pressure End Time: 14:46 Procedural Pain: 0 Post Procedural Pain: 0 Response to Treatment: Procedure was tolerated well Level of Consciousness (Post- Level of Consciousness (Post- Awake and Alert procedure): Post Debridement Measurements of Total Wound Length: (cm) 2.1 Width: (cm) 1.1 Depth: (cm) 0.1 Volume: (cm) 0.181 Character of Wound/Ulcer Post Debridement: Improved Post Procedure Diagnosis Same as Pre-procedure Electronic Signature(s) Signed: 03/04/2022 4:47:38 PM By: Fredirick Maudlin MD FACS Signed: 03/04/2022 5:22:54 PM By: Dellie Catholic RN Entered By: Dellie Catholic on 03/04/2022 14:50:28 -------------------------------------------------------------------------------- Debridement Details Patient Name: Date of Service: [name] 03/04/2022 2:00 PM Medical Record Number: [id number] Patient Account Number: [id number] Date of Birth/Sex: Treating RN: [birth year] (71 y.o. [name] Primary Care Provider: PA [name], [place] Other Clinician: Referring Provider: Treating Provider/Extender: [name] in Treatment: 3 Debridement Performed for Assessment: Wound #1 Left,Dorsal Foot Performed By: Physician Fredirick Maudlin, MD Debridement Type: Debridement Level of Consciousness (Pre-procedure): Awake and Alert Pre-procedure Verification/Time Out Yes - 14:45 Taken: Start Time: 14:45 Pain Control: Lidocaine 5% topical ointment T Area Debrided (L x W): otal 2.5 (cm) x 2.5 (cm) = 6.25 (cm) Tissue and other material debrided: Non-Viable, Slough, Subcutaneous, Slough Level: Skin/Subcutaneous Tissue Debridement Description: Excisional Instrument: Curette Bleeding: Minimum Hemostasis Achieved: Pressure End Time: 14:46 Procedural Pain: 0 Post Procedural Pain: 0 Response to Treatment: Procedure was tolerated well Level of Consciousness (Post- Awake and Alert procedure): Post Debridement Measurements of Total Wound Length: (cm) 2.5 Width: (cm) 2.5 Depth: (cm) 0.4 Volume: (cm) 1.963 Character of Wound/Ulcer Post Debridement: Improved Post Procedure Diagnosis Same as Pre-procedure Electronic Signature(s) Signed: 03/04/2022 4:47:38 PM By: Fredirick Maudlin MD FACS Signed: 03/04/2022 5:22:54 PM By: Dellie Catholic RN Entered By: Dellie Catholic on 03/04/2022 14:51:24 -------------------------------------------------------------------------------- HPI Details Patient Name: Date of Service: [name] 03/04/2022 2:00 PM Medical Record Number: [id number] Patient Account Number: [id number] Date of Birth/Sex: Treating RN: 26-Apr-1951 (71 y.o. M) Primary Care Provider: PA [name], NO Other Clinician: Referring Provider: Treating Provider/Extender: [name],  Jewel Baize, [name] in Treatment: 3 History of Present Illness HPI Description: ADMISSION 02/09/2022 This is a 71 year old man who recently moved to [place] from [place]. He has a history of of coronary artery disease and prior left popliteal vein DVT . He is not diabetic and does not smoke. 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 He has had multiple spinal surgeries and has limited mobility. He primarily uses a motorized wheelchair. He has had lymphedema for many years and does have lymphedema pumps although he says he does not use them frequently. He has wounds on his bilateral lower extremities. He was receiving care in [place] for these prior to his move. They were apparently packing his wounds with Betadine soaked gauze. He has worn compression wraps in the past but not recently. He did say that they helped tremendously. In clinic today, his right ABI is noncompressible but has good signals; the left ABI was 1.17. No formal vascular studies have been performed. On his left dorsal foot, there is a circular lesion that exposes the fat layer. There is fairly heavy slough accumulation within the wound, but no significant odor. On his right medial calf, he has multiple pinholes that have slough buildup in them and probe for about 2 to 4 mm in depth. They are draining clear fluid. On his right lateral midfoot, he has ulceration consistent with venous stasis. It is geographic and has slough accumulation. 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 02/17/2022: All of the wounds are little bit larger today and have more slough accumulation. Today, the medial calf openings are larger and probe to something that feels calcified. There is odor coming from his wounds. His vascular studies are scheduled for August 9 and 10. 02-24-2022 upon evaluation today patient presents for follow-up concerning his ongoing issues here with his lower extremities. With that being said he does have significant problems with what appears to be cellulitis unfortunately the PCR culture that was sent last week got crushed by [employer] in transit. With that being said we are going to reobtain a culture today although I am actually to do it  on the right medial leg as this seems to be the most inflamed area today where there is some questionable drainage as well. I am going to remove some of the calcium deposits which are loosening obtain a deeper culture from this area. Fortunately I do not see any evidence of active infection systemically which is good news but locally I think we are having a bigger issue here. He does have his appointment next Thursday with vascular. Patient has also been referred to [facility] for further evaluation and treatment of the calcinosis for possible sulfate injections. 03/04/2022: The culture that was performed last week grew out multiple species including Klebsiella, Morganella, and Pseudomonas aeruginosa. He had been prescribed Bactrim but this was not adequate to cover all of the species and levofloxacin was recommended. He completed the Bactrim but did not pick up the levofloxacin and begin taking it until yesterday. 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 He had arterial studies performed today. He does have evidence of peripheral arterial disease. Results copied here: +-------+-----------+-----------+------------+------------+ ABI/TBIT oday's ABIT oday's TBIPrevious ABIPrevious TBI +-------+-----------+-----------+------------+------------+ Right [place] 0.75    +-------+-----------+-----------+------------+------------+  Left [place] 0.57    +-------+-----------+-----------+------------+------------+ Arterial wall calcification precludes accurate ankle pressures and ABIs. Summary: Right: Resting right ankle-brachial index indicates noncompressible right lower extremity arteries. The right toe-brachial index is normal. Left: Resting left ankle-brachial index indicates noncompressible left lower extremity arteries. The  left toe-brachial index is abnormal. He continues to have further tissue breakdown at the right medial calf and there is ample slough accumulation along with necrotic subcutaneous tissue. The left dorsal foot wound has built up slough, as well. The right medial foot wound is nearly closed with just a light layer of eschar over the surface. The right dorsal lateral foot wound has also accumulated a fair amount of slough and remains quite tender. Electronic Signature(s) Signed: 03/04/2022 3:29:58 PM By: Fredirick Maudlin MD FACS Entered By: Fredirick Maudlin on 03/04/2022 15:29:58 -------------------------------------------------------------------------------- Physical Exam Details Patient Name: Date of Service: [name] 03/04/2022 2:00 PM Medical Record Number: [id number] Patient Account Number: [id number] Date of Birth/Sex: Treating RN: 1951-06-20 (71 y.o. M) Primary Care Provider: PA [name], NO Other Clinician: Referring Provider: Treating Provider/Extender: [name], Johann Capers Weeks in Treatment: 3 Constitutional . . . . No acute distress.Marland Kitchen Respiratory Normal work of breathing on room air.. Notes 03/04/2022: He continues to have further tissue breakdown at the right medial calf and there is ample slough accumulation along with necrotic subcutaneous tissue. The left dorsal foot wound has built up slough, as well. The right medial foot wound is nearly closed with just a light layer of eschar over the surface. The right dorsal lateral foot wound has also accumulated a fair amount of slough and remains quite tender. Electronic Signature(s) Signed: 03/04/2022 3:30:43 PM By: Fredirick Maudlin MD FACS Entered By: Fredirick Maudlin on 03/04/2022 15:30:42 -------------------------------------------------------------------------------- Physician Orders Details Patient Name: Date of Service: [name] 03/04/2022 2:00 PM Medical Record Number: [id number] Patient Account Number:  [id number] Date of Birth/Sex: Treating RN: 1950/08/03 (71 y.o. [name] Primary Care Provider: PA [name], [place] Other Clinician: Referring Provider: Treating Provider/Extender: [name] in Treatment: 3 Verbal / Phone Orders: No Diagnosis Coding ICD-10 Coding Code Description [id number] 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d L97.522 Non-pressure chronic ulcer of other part of left foot with fat layer exposed 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) I10 Essential (primary) hypertension I25.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 I89.0 Lymphedema, not elsewhere classified Z86.718 Personal 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 Follow-up Appointments ppointment in 1 week. - Dr. [name] Rm 1 Wednesday August 16th at 1:15pm Return A Bathing/ Shower/ Hygiene May shower with protection but do not get wound dressing(s) wet. Edema Control - Lymphedema / SCD / Other Lymphedema Pumps. Use Lymphedema pumps on leg(s) 2-3 times a day for 45-60 minutes. If wearing any wraps or hose, do not remove them. Continue exercising as instructed. - use 1-2 times per day over wraps Elevate legs to the level of the heart or above for 30 minutes daily and/or when sitting, a frequency of: Exercise regularly Wound Treatment Wound #1 - Foot Wound Laterality: Dorsal, Left Peri-Wound Care: Sween Lotion (Moisturizing lotion) 1 x Per Week Discharge Instructions: Apply moisturizing lotion as directed Topical: Gentamicin 1 x Per Week Discharge Instructions: As directed by physician Prim Dressing: KerraCel Ag Gelling Fiber Dressing, 4x5 in (silver alginate) 1 x Per Week ary Discharge Instructions: Apply silver alginate to  wound bed as instructed Prim Dressing: Optifoam Non-Adhesive Dressing, 4x4 in 1 x  Per Week ary Discharge Instructions: Apply to wound bed as instructed Secondary Dressing: ABD Pad, 8x10 1 x Per Week Discharge Instructions: Apply over primary dressing as directed. Secondary Dressing: Woven Gauze Sponge, Non-Sterile 4x4 in 1 x Per Week Discharge Instructions: Apply over primary dressing as directed. Compression Wrap: ThreePress (3 layer compression wrap) 1 x Per Week Discharge Instructions: Apply three layer compression as directed. Wound #2 - Foot Wound Laterality: Right, Lateral Peri-Wound Care: Sween Lotion (Moisturizing lotion) 1 x Per Week Discharge Instructions: Apply moisturizing lotion as directed Topical: Gentamicin 1 x Per Week Discharge Instructions: As directed by physician Prim Dressing: KerraCel Ag Gelling Fiber Dressing, 4x5 in (silver alginate) 1 x Per Week ary Discharge Instructions: Apply silver alginate to wound bed as instructed Prim Dressing: Optifoam Non-Adhesive Dressing, 4x4 in 1 x Per Week ary Discharge Instructions: Apply to wound bed as instructed Secondary Dressing: ABD Pad, 8x10 1 x Per Week Discharge Instructions: Apply over primary dressing as directed. Secondary Dressing: Woven Gauze Sponge, Non-Sterile 4x4 in 1 x Per Week Discharge Instructions: Apply over primary dressing as directed. Compression Wrap: ThreePress (3 layer compression wrap) 1 x Per Week Discharge Instructions: Apply three layer compression as directed. Wound #3 - Lower Leg Wound Laterality: Right, Medial Peri-Wound Care: Sween Lotion (Moisturizing lotion) 1 x Per Week Discharge Instructions: Apply moisturizing lotion as directed Topical: Gentamicin 1 x Per Week Discharge Instructions: As directed by physician Prim Dressing: KerraCel Ag Gelling Fiber Dressing, 4x5 in (silver alginate) 1 x Per Week ary Discharge Instructions: Apply silver alginate to wound bed as instructed Prim Dressing: Optifoam Non-Adhesive Dressing, 4x4 in 1 x Per Week ary Discharge  Instructions: Apply to wound bed as instructed Secondary Dressing: ABD Pad, 8x10 1 x Per Week Discharge Instructions: Apply over primary dressing as directed. Secondary Dressing: Woven Gauze Sponge, Non-Sterile 4x4 in 1 x Per Week Discharge Instructions: Apply over primary dressing as directed. Compression Wrap: ThreePress (3 layer compression wrap) 1 x Per Week Discharge Instructions: Apply three layer compression as directed. Wound #4 - Foot Wound Laterality: Right, Medial Peri-Wound Care: Sween Lotion (Moisturizing lotion) 1 x Per Week Discharge Instructions: Apply moisturizing lotion as directed Topical: Gentamicin 1 x Per Week Discharge Instructions: As directed by physician Prim Dressing: KerraCel Ag Gelling Fiber Dressing, 4x5 in (silver alginate) 1 x Per Week ary Discharge Instructions: Apply silver alginate to wound bed as instructed Prim Dressing: Optifoam Non-Adhesive Dressing, 4x4 in 1 x Per Week ary Discharge Instructions: Apply to wound bed as instructed Secondary Dressing: ABD Pad, 8x10 1 x Per Week Discharge Instructions: Apply over primary dressing as directed. Secondary Dressing: Woven Gauze Sponge, Non-Sterile 4x4 in 1 x Per Week Discharge Instructions: Apply over primary dressing as directed. Compression Wrap: ThreePress (3 layer compression wrap) 1 x Per Week Discharge Instructions: Apply three layer compression as directed. Wound #5 - Lower Leg Wound Laterality: Right, Anterior Peri-Wound Care: Sween Lotion (Moisturizing lotion) 1 x Per Week Discharge Instructions: Apply moisturizing lotion as directed Topical: Gentamicin 1 x Per Week Discharge Instructions: As directed by physician Prim Dressing: KerraCel Ag Gelling Fiber Dressing, 4x5 in (silver alginate) 1 x Per Week ary Discharge Instructions: Apply silver alginate to wound bed as instructed Prim Dressing: Optifoam Non-Adhesive Dressing, 4x4 in 1 x Per Week ary Discharge Instructions: Apply to wound bed as  instructed Secondary Dressing: ABD Pad, 8x10 1 x Per Week Discharge Instructions: Apply over primary dressing  as directed. Secondary Dressing: Woven Gauze Sponge, Non-Sterile 4x4 in 1 x Per Week Discharge Instructions: Apply over primary dressing as directed. Compression Wrap: ThreePress (3 layer compression wrap) 1 x Per Week Discharge Instructions: Apply three layer compression as directed. Consults Vascular surgery - Requesting any interventions to increase bloodflow to lower extremities. - (ICD10 [id number] - 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d) Electronic Signature(s) Signed: 03/04/2022 5:22:54 PM By: Dellie Catholic RN Signed: 03/05/2022 9:38:58 AM By: Fredirick Maudlin MD FACS Previous Signature: 03/04/2022 4:47:38 PM Version By: Fredirick Maudlin MD FACS Entered By: Dellie Catholic on 03/04/2022 16:53:39 Prescription 03/04/2022 -------------------------------------------------------------------------------- Zadie Rhine MD Patient Name: Provider: Nov 16, 1950 [id number] Date of Birth: NPI#[id number] [id number] Sex: DEA #: [phone] Phone #: License #: [place] Patient Address: [address] [place], [place] [address] [place], [place] [address] [phone] Allergies No Known Allergies Provider's Orders Vascular surgery - ICD10: L97.812 - Requesting any interventions to increase bloodflow to lower extremities. Hand Signature: Date(s): Electronic Signature(s) Signed: 03/04/2022 5:22:54 PM By: Dellie Catholic RN Signed: 03/05/2022 9:38:58 AM By: Fredirick Maudlin MD FACS Previous Signature: 03/04/2022 4:10:19 PM Version By: Fredirick Maudlin MD FACS Entered By: Dellie Catholic on 03/04/2022 16:53:39 -------------------------------------------------------------------------------- Problem List Details Patient Name: Date of Service: DEMAURION, DICIOCCIO  03/04/2022 2:00 PM Medical Record Number: [id number] Patient Account Number: [id number] Date of Birth/Sex: Treating RN: [birth year] (71 y.o. M) Primary Care Provider: PA [name], [place] Other Clinician: Referring Provider: Treating Provider/Extender: [name] in Treatment: 3 Active Problems ICD-10 Encounter Code Description Active Date MDM Diagnosis L97.812 Non-pressure chronic ulcer of other part of right lower leg with fat layer 02/09/2022 No Yes exposed L97.522 Non-pressure chronic ulcer of other part of left foot with fat layer exposed 02/09/2022 No Yes 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) 02/09/2022 No Yes I10 Essential (primary) hypertension 02/09/2022 No Yes I25.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 02/09/2022 No Yes I89.0 Lymphedema, not elsewhere classified 02/09/2022 No Yes Z86.718 Personal history of other venous thrombosis and embolism 02/09/2022 No Yes L97.512 Non-pressure chronic ulcer of other part of right foot with fat layer exposed 02/17/2022 No Yes Inactive Problems Resolved Problems Electronic Signature(s) Signed: 03/04/2022 3:26:52 PM By: Fredirick Maudlin MD FACS Entered By: Fredirick Maudlin on 03/04/2022 15:26:51 -------------------------------------------------------------------------------- Progress Note Details Patient Name: Date of Service: [name] 03/04/2022 2:00 PM Medical Record Number: [id number] Patient Account Number: [id number] Date of Birth/Sex: Treating RN: 07-05-1951 (71 y.o. M) Primary Care Provider: PA [name], NO Other Clinician: Referring Provider: Treating Provider/Extender: [name] in Treatment: 3 Subjective Chief Complaint Information obtained from Patient Patient seen for complaints of Non-Healing Wounds. History of Present Illness (HPI) ADMISSION 02/09/2022 This is a 71 year old man who recently moved to [place] from  [place]. He has a history of of coronary artery disease and prior left popliteal vein DVT . He is not diabetic and does not smoke. 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 He has had multiple spinal surgeries and has limited mobility. He primarily uses a motorized wheelchair. He has had lymphedema for many years and does have lymphedema pumps although he says he does not use them frequently. He has wounds on his bilateral lower extremities. He was receiving care in [place] for these prior to his move. They were apparently packing his wounds with Betadine soaked gauze.  He has worn compression wraps in the past but not recently. He did say that they helped tremendously. In clinic today, his right ABI is noncompressible but has good signals; the left ABI was 1.17. No formal vascular studies have been performed. On his left dorsal foot, there is a circular lesion that exposes the fat layer. There is fairly heavy slough accumulation within the wound, but no significant odor. On his right medial calf, he has multiple pinholes that have slough buildup in them and probe for about 2 to 4 mm in depth. They are draining clear fluid. On his right lateral midfoot, he has ulceration consistent with venous stasis. It is geographic and has slough accumulation. 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 02/17/2022: All of the wounds are little bit larger today and have more slough accumulation. Today, the medial calf openings are larger and probe to something that feels calcified. There is odor coming from his wounds. His vascular studies are scheduled for August 9 and 10. 02-24-2022 upon evaluation today patient presents for follow-up concerning his ongoing issues here with his lower extremities. With that being said he does have significant problems with what appears to be cellulitis unfortunately the PCR culture that  was sent last week got crushed by [employer] in transit. With that being said we are going to reobtain a culture today although I am actually to do it on the right medial leg as this seems to be the most inflamed area today where there is some questionable drainage as well. I am going to remove some of the calcium deposits which are loosening obtain a deeper culture from this area. Fortunately I do not see any evidence of active infection systemically which is good news but locally I think we are having a bigger issue here. He does have his appointment next Thursday with vascular. Patient has also been referred to [facility] for further evaluation and treatment of the calcinosis for possible sulfate injections. 03/04/2022: The culture that was performed last week grew out multiple species including Klebsiella, Morganella, and Pseudomonas aeruginosa. He had been prescribed Bactrim but this was not adequate to cover all of the species and levofloxacin was recommended. He completed the Bactrim but did not pick up the levofloxacin and begin taking it until yesterday. 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 He had arterial studies performed today. He does have evidence of peripheral arterial disease. Results copied here: +-------+-----------+-----------+------------+------------+ ABI/TBIT oday's ABIT oday's TBIPrevious ABIPrevious TBI +-------+-----------+-----------+------------+------------+ Right [place] 0.75    +-------+-----------+-----------+------------+------------+ Left [place] 0.57    +-------+-----------+-----------+------------+------------+ Arterial wall calcification precludes accurate ankle pressures and ABIs. Summary: Right: Resting right ankle-brachial index indicates noncompressible right lower extremity  arteries. The right toe-brachial index is normal. Left: Resting left ankle-brachial index indicates noncompressible left lower extremity arteries. The left toe-brachial index is abnormal. He continues to have further tissue breakdown at the right medial calf and there is ample slough accumulation along with necrotic subcutaneous tissue. The left dorsal foot wound has built up slough, as well. The right medial foot wound is nearly closed with just a light layer of eschar over the surface. The right dorsal lateral foot wound has also accumulated a fair amount of slough and remains quite tender. Patient History Information obtained from Patient, Chart. Family History  Cancer - Mother, Heart Disease - Father,Mother, Hypertension - Father,Siblings, Lung Disease - Siblings, No family history of Diabetes, Hereditary Spherocytosis, Kidney Disease, Seizures, Stroke, Thyroid Problems, Tuberculosis. Social History Former smoker - quit 1991, Marital Status - Married, Alcohol Use - Moderate, Drug Use - No History, Caffeine Use - Daily - coffee. Medical History Eyes Patient has history of Cataracts - right eye removed Denies history of Glaucoma, Optic Neuritis Ear/Nose/Mouth/Throat Denies history of Chronic sinus problems/congestion, Middle ear problems Hematologic/Lymphatic Patient has history of Anemia - macrocytic, Lymphedema Cardiovascular Patient has history of Congestive Heart Failure, Deep Vein Thrombosis - left leg, Hypertension, Peripheral Venous Disease Endocrine Denies history of Type I Diabetes, Type II Diabetes Genitourinary Denies history of End Stage Renal Disease Integumentary (Skin) Denies history of History of Burn Oncologic Denies history of Received Chemotherapy, Received Radiation Psychiatric Denies history of Anorexia/bulimia, Confinement Anxiety Hospitalization/Surgery History - cervical fusion. - lumbar surgery. - coronary stent placement. - lasik eye surgery. - hydrocele  excision. Medical A Surgical History Notes nd Constitutional Symptoms (General Health) obesity Ear/Nose/Mouth/Throat hard of hearing Respiratory frozen diaphragm right Cardiovascular hyperlipidemia Musculoskeletal myositis, lumbar DDD, spinal stenosis, cervical facet joint syndrome Objective Constitutional No acute distress.. Vitals Time Taken: 2:03 PM, Temperature: 98.3 F, Pulse: 66 bpm, Respiratory Rate: 18 breaths/min, Blood Pressure: 127/53 mmHg. Respiratory Normal work of breathing on room air.. General Notes: 03/04/2022: He continues to have further tissue breakdown at the right medial calf and there is ample slough accumulation along with necrotic subcutaneous tissue. The left dorsal foot wound has built up slough, as well. The right medial foot wound is nearly closed with just a light layer of eschar over the surface. The right dorsal lateral foot wound has also accumulated a fair amount of slough and remains quite tender. Integumentary (Hair, Skin) Wound #1 status is Open. Original cause of wound was Gradually Appeared. The date acquired was: 12/07/2021. The wound has been in treatment 3 weeks. The wound is located on the Left,Dorsal Foot. The wound measures 2.5cm length x 2.5cm width x 0.4cm depth; 4.909cm^2 area and 1.963cm^3 volume. There is Fat Layer (Subcutaneous Tissue) exposed. There is no tunneling or undermining noted. There is a large amount of serosanguineous drainage noted. The wound margin is well defined and not attached to the wound base. There is small (1-33%) red, pink granulation within the wound bed. There is a large (67-100%) amount of necrotic tissue within the wound bed including Adherent Slough. Wound #2 status is Open. Original cause of wound was Gradually Appeared. The date acquired was: 11/02/2021. The wound has been in treatment 3 weeks. The wound is located on the Right,Lateral Foot. The wound measures 2.1cm length x 1.1cm width x 0.1cm depth;  1.814cm^2 area and 0.181cm^3 volume. There is Fat Layer (Subcutaneous Tissue) exposed. There is no tunneling or undermining noted. There is a medium amount of serosanguineous drainage noted. The wound margin is flat and intact. There is small (1-33%) pink granulation within the wound bed. There is a large (67-100%) amount of necrotic tissue within the wound bed including Eschar and Adherent Slough. Wound #3 status is Open. Original cause of wound was Gradually Appeared. The date acquired was: 12/07/2021. The wound has been in treatment 3 weeks. The wound is located on the Right,Medial Lower Leg. The wound measures 3.2cm length x 4cm width x 0.7cm depth; 10.053cm^2 area and 7.037cm^3 volume. There is Fat Layer (Subcutaneous Tissue) exposed. There is no tunneling or undermining noted. There is a medium amount of serosanguineous  drainage noted. The wound margin is distinct with the outline attached to the wound base. There is small (1-33%) red granulation within the wound bed. There is a large (67-100%) amount of necrotic tissue within the wound bed including Eschar and Adherent Slough. Wound #4 status is Open. Original cause of wound was Gradually Appeared. The date acquired was: 02/17/2022. The wound has been in treatment 2 weeks. The wound is located on the Right,Medial Foot. The wound measures 0.2cm length x 0.5cm width x 0.1cm depth; 0.079cm^2 area and 0.008cm^3 volume. There is Fat Layer (Subcutaneous Tissue) exposed. There is no tunneling or undermining noted. There is a medium amount of serosanguineous drainage noted. The wound margin is flat and intact. There is medium (34-66%) red granulation within the wound bed. There is a medium (34-66%) amount of necrotic tissue within the wound bed including Eschar and Adherent Slough. Wound #5 status is Open. Original cause of wound was Gradually Appeared. The date acquired was: 03/03/2022. The wound is located on the Right,Anterior Lower Leg. The wound  measures 1.1cm length x 0.2cm width x 0.1cm depth; 0.173cm^2 area and 0.017cm^3 volume. There is Fat Layer (Subcutaneous Tissue) exposed. There is no tunneling or undermining noted. There is a medium amount of serosanguineous drainage noted. There is large (67-100%) red granulation within the wound bed. There is a small (1-33%) amount of necrotic tissue within the wound bed including Eschar and Adherent Slough. 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 There was a Excisional Skin/Subcutaneous Tissue Debridement with a total area of 6.25 sq cm performed by Fredirick Maudlin, MD. With the following instrument(s): Curette to remove Non-Viable tissue/material. Material removed includes Subcutaneous Tissue and Slough and after achieving pain control using Lidocaine 5% topical ointment. No specimens were taken. A time out was conducted at 14:45, prior to the start of the procedure. A Minimum amount of bleeding was controlled with Pressure. The procedure was tolerated well with a pain level of 0 throughout and a pain level of 0 following the procedure. Post Debridement Measurements: 2.5cm length x 2.5cm width x 0.4cm depth; 1.963cm^3 volume. Character of Wound/Ulcer Post Debridement is improved. Post procedure Diagnosis Wound #1: Same as Pre-Procedure Wound #2 Pre-procedure diagnosis of Wound #2 is a Lymphedema located on  the Right,Lateral Foot . There was a Selective/Open Wound Non-Viable Tissue Debridement with a total area of 2.31 sq cm performed by Fredirick Maudlin, MD. With the following instrument(s): Curette to remove Non-Viable tissue/material. Material removed includes Eschar and Slough and after achieving pain control using Lidocaine 5% topical ointment. No specimens were taken. A time out was conducted at 14:45, prior to the start of the procedure. A Minimum amount of bleeding was controlled with Pressure. The procedure was tolerated well with a pain level of 0 throughout and a pain level of 0 following the procedure. Post Debridement Measurements: 2.1cm length x 1.1cm width x 0.1cm depth; 0.181cm^3 volume. Character of Wound/Ulcer Post Debridement is improved. Post procedure Diagnosis Wound #2: Same as Pre-Procedure Wound #3 Pre-procedure diagnosis of  Wound #3 is a Lymphedema located on the Right,Medial Lower Leg . There was a Excisional Skin/Subcutaneous Tissue Debridement with a total area of 12.8 sq cm performed by Fredirick Maudlin, MD. With the following instrument(s): Curette to remove Non-Viable tissue/material. Material removed includes Subcutaneous Tissue, Slough, and Other: calcium after achieving pain control using Lidocaine 5% topical ointment. No specimens were taken. A time out was conducted at 14:45, prior to the start of the procedure. A Minimum amount of bleeding was controlled with Pressure. The procedure was tolerated well with a pain level of 0 throughout and a pain level of 0 following the procedure. Post Debridement Measurements: 3.2cm length x 4cm width x 0.7cm depth; 7.037cm^3 volume. Character of Wound/Ulcer Post Debridement is improved. Post procedure Diagnosis Wound #3: Same as Pre-Procedure Wound #4 Pre-procedure diagnosis of Wound #4 is a Lymphedema located on the Right,Medial Foot . There was a Selective/Open Wound Non-Viable Tissue Debridement with a total area of 0.1 sq cm  performed by Fredirick Maudlin, MD. With the following instrument(s): Curette to remove Non-Viable tissue/material. Material removed includes Eschar after achieving pain control using Lidocaine 5% topical ointment. No specimens were taken. A time out was conducted at 14:45, prior to the start of the procedure. A Minimum amount of bleeding was controlled with Pressure. The procedure was tolerated well with a pain level of 0 throughout and a pain level of 0 following the procedure. Post Debridement Measurements: 0.2cm length x 0.5cm width x 0.1cm depth; 0.008cm^3 volume. Character of Wound/Ulcer Post Debridement is improved. Post procedure Diagnosis Wound #4: Same as Pre-Procedure Wound #5 Pre-procedure diagnosis of Wound #5 is an Abrasion located on the Right,Anterior Lower Leg . There was a Selective/Open Wound Non-Viable Tissue Debridement with a total area of 0.22 sq cm performed by Fredirick Maudlin, MD. With the following instrument(s): Curette to remove Non-Viable tissue/material. Material removed includes Eschar after achieving pain control using Lidocaine 5% topical ointment. No specimens were taken. A time out was conducted at 14:45, prior to the start of the procedure. A Minimum amount of bleeding was controlled with Pressure. The procedure was tolerated well with a pain level of 0 throughout and a pain level of 0 following the procedure. Post Debridement Measurements: 1.1cm length x 0.2cm width x 0.1cm depth; 0.017cm^3 volume. Character of Wound/Ulcer Post Debridement is improved. Post procedure Diagnosis Wound #5: Same as Pre-Procedure Plan Follow-up Appointments: Return Appointment in 1 week. - Dr. [name] Rm 1 Wednesday August 16th at 1:15pm Bathing/ Shower/ Hygiene: May shower with protection but do not get wound dressing(s) wet. Edema Control - Lymphedema / SCD / Other: Lymphedema Pumps. Use Lymphedema pumps on leg(s) 2-3 times a day for 45-60 minutes. If wearing any wraps or hose,  do not remove them. Continue exercising as instructed. - use 1-2 times per day over wraps Elevate legs to the level of the heart or above for 30 minutes daily and/or when sitting, a frequency of: Exercise regularly Consults ordered were: Vascular surgery - Requesting any interventions to increase bloodflow to lower extremities. WOUND #1: - Foot Wound Laterality: Dorsal, Left Peri-Wound Care: Sween Lotion (Moisturizing lotion) 1 x Per Week/ Discharge Instructions: Apply moisturizing lotion as directed Topical: Gentamicin 1 x Per Week/ Discharge Instructions: As directed by physician Prim Dressing: KerraCel Ag Gelling Fiber Dressing, 4x5 in (silver alginate) 1 x Per Week/ ary Discharge Instructions: Apply silver alginate to wound bed as instructed Prim Dressing: Optifoam Non-Adhesive Dressing, 4x4 in 1 x Per Week/ ary Discharge Instructions: Apply to  wound bed as instructed Secondary Dressing: ABD Pad, 8x10 1 x Per Week/ Discharge Instructions: Apply over primary dressing as directed. Secondary Dressing: Woven Gauze Sponge, Non-Sterile 4x4 in 1 x Per Week/ Discharge Instructions: Apply over primary dressing as directed. Com pression Wrap: ThreePress (3 layer compression wrap) 1 x Per Week/ Discharge Instructions: Apply three layer compression as directed. WOUND #2: - Foot Wound Laterality: Right, Lateral Peri-Wound Care: Sween Lotion (Moisturizing lotion) 1 x Per Week/ Discharge Instructions: Apply moisturizing lotion as directed Topical: Gentamicin 1 x Per Week/ Discharge Instructions: As directed by physician Prim Dressing: KerraCel Ag Gelling Fiber Dressing, 4x5 in (silver alginate) 1 x Per Week/ ary Discharge Instructions: Apply silver alginate to wound bed as instructed Prim Dressing: Optifoam Non-Adhesive Dressing, 4x4 in 1 x Per Week/ ary Discharge Instructions: Apply to wound bed as instructed Secondary Dressing: ABD Pad, 8x10 1 x Per Week/ Discharge Instructions: Apply over  primary dressing as directed. Secondary Dressing: Woven Gauze Sponge, Non-Sterile 4x4 in 1 x Per Week/ Discharge Instructions: Apply over primary dressing as directed. Com pression Wrap: ThreePress (3 layer compression wrap) 1 x Per Week/ Discharge Instructions: Apply three layer compression as directed. WOUND #3: - Lower Leg Wound Laterality: Right, Medial Peri-Wound Care: Sween Lotion (Moisturizing lotion) 1 x Per Week/ Discharge Instructions: Apply moisturizing lotion as directed Topical: Gentamicin 1 x Per Week/ Discharge Instructions: As directed by physician Prim Dressing: KerraCel Ag Gelling Fiber Dressing, 4x5 in (silver alginate) 1 x Per Week/ ary Discharge Instructions: Apply silver alginate to wound bed as instructed Prim Dressing: Optifoam Non-Adhesive Dressing, 4x4 in 1 x Per Week/ ary Discharge Instructions: Apply to wound bed as instructed Secondary Dressing: ABD Pad, 8x10 1 x Per Week/ Discharge Instructions: Apply over primary dressing as directed. Secondary Dressing: Woven Gauze Sponge, Non-Sterile 4x4 in 1 x Per Week/ Discharge Instructions: Apply over primary dressing as directed. Com pression Wrap: ThreePress (3 layer compression wrap) 1 x Per Week/ Discharge Instructions: Apply three layer compression as directed. WOUND #4: - Foot Wound Laterality: Right, Medial Peri-Wound Care: Sween Lotion (Moisturizing lotion) 1 x Per Week/ Discharge Instructions: Apply moisturizing lotion as directed Topical: Gentamicin 1 x Per Week/ Discharge Instructions: As directed by physician Prim Dressing: KerraCel Ag Gelling Fiber Dressing, 4x5 in (silver alginate) 1 x Per Week/ ary Discharge Instructions: Apply silver alginate to wound bed as instructed Prim Dressing: Optifoam Non-Adhesive Dressing, 4x4 in 1 x Per Week/ ary Discharge Instructions: Apply to wound bed as instructed Secondary Dressing: ABD Pad, 8x10 1 x Per Week/ Discharge Instructions: Apply over primary dressing as  directed. Secondary Dressing: Woven Gauze Sponge, Non-Sterile 4x4 in 1 x Per Week/ Discharge Instructions: Apply over primary dressing as directed. Com pression Wrap: ThreePress (3 layer compression wrap) 1 x Per Week/ Discharge Instructions: Apply three layer compression as directed. WOUND #5: - Lower Leg Wound Laterality: Right, Anterior Peri-Wound Care: Sween Lotion (Moisturizing lotion) 1 x Per Week/ Discharge Instructions: Apply moisturizing lotion as directed Topical: Gentamicin 1 x Per Week/ Discharge Instructions: As directed by physician Prim Dressing: KerraCel Ag Gelling Fiber Dressing, 4x5 in (silver alginate) 1 x Per Week/ ary Discharge Instructions: Apply silver alginate to wound bed as instructed Prim Dressing: Optifoam Non-Adhesive Dressing, 4x4 in 1 x Per Week/ ary Discharge Instructions: Apply to wound bed as instructed Secondary Dressing: ABD Pad, 8x10 1 x Per Week/ Discharge Instructions: Apply over primary dressing as directed. Secondary Dressing: Woven Gauze Sponge, Non-Sterile 4x4 in 1 x Per Week/ Discharge Instructions: Apply  over primary dressing as directed. Com pression Wrap: ThreePress (3 layer compression wrap) 1 x Per Week/ Discharge Instructions: Apply three layer compression as directed. 03/04/2022: He continues to have further tissue breakdown at the right medial calf and there is ample slough accumulation along with necrotic subcutaneous tissue. The left dorsal foot wound has built up slough, as well. The right medial foot wound is nearly closed with just a light layer of eschar over the surface. The right dorsal lateral foot wound has also accumulated a fair amount of slough and remains quite tender. I used a curette to debride slough, eschar, and necrotic subcutaneous tissue from each of the wounds as discussed above. We will continue the topical gentamicin with silver alginate and 3 layer compression bilaterally. I would like him to be seen by vascular  surgery to see if he has any revascularization options that might aid in his wound healing. He will complete his course of oral antibiotics. If he has not heard from dermatology by next week, we will try to contact them to get him in for an appointment. Follow-up in 1 week. Electronic Signature(s) Signed: 03/04/2022 3:32:16 PM By: Fredirick Maudlin MD FACS Entered By: Fredirick Maudlin on 03/04/2022 15:32:15 -------------------------------------------------------------------------------- HxROS Details Patient Name: Date of Service: [name] 03/04/2022 2:00 PM Medical Record Number: [id number] Patient Account Number: [id number] Date of Birth/Sex: Treating RN: 1951-05-14 (71 y.o. M) Primary Care Provider: PA [name], NO Other Clinician: Referring Provider: Treating Provider/Extender: [name] in Treatment: 3 Information Obtained From Patient Chart Constitutional Symptoms (General Health) Medical History: Past Medical History Notes: obesity Eyes Medical History: Positive for: Cataracts - right eye removed Negative for: Glaucoma; Optic Neuritis Ear/Nose/Mouth/Throat Medical History: Negative for: Chronic sinus problems/congestion; Middle ear problems Past Medical History Notes: hard of hearing Hematologic/Lymphatic Medical History: Positive for: Anemia - macrocytic; Lymphedema Respiratory Medical History: Past Medical History Notes: frozen diaphragm right Cardiovascular Medical History: Positive for: Congestive Heart Failure; Deep Vein Thrombosis - left leg; Hypertension; Peripheral Venous Disease Past Medical History Notes: hyperlipidemia Endocrine Medical History: Negative for: Type I Diabetes; Type II Diabetes Genitourinary Medical History: Negative for: End Stage Renal Disease Integumentary (Skin) Medical History: Negative for: History of Burn Musculoskeletal Medical History: Past Medical History Notes: myositis, lumbar DDD, spinal  stenosis, cervical facet joint syndrome Oncologic Medical History: Negative for: Received Chemotherapy; Received Radiation Psychiatric Medical History: Negative for: Anorexia/bulimia; Confinement Anxiety HBO Extended History Items Eyes: Cataracts Immunizations Pneumococcal Vaccine: Received Pneumococcal Vaccination: Yes Received Pneumococcal Vaccination On or After 60th Birthday: Yes Implantable Devices No devices added Hospitalization / Surgery History Type of Hospitalization/Surgery cervical fusion lumbar surgery coronary stent placement lasik eye surgery hydrocele excision Family and Social History Cancer: Yes - Mother; Diabetes: No; Heart Disease: Yes - Father,Mother; Hereditary Spherocytosis: No; Hypertension: Yes - Father,Siblings; Kidney Disease: No; Lung Disease: Yes - Siblings; Seizures: No; Stroke: No; Thyroid Problems: No; Tuberculosis: No; Former smoker - quit 1991; Marital Status - Married; Alcohol Use: Moderate; Drug Use: No History; Caffeine Use: Daily - coffee; Financial Concerns: No; Food, Clothing or Shelter Needs: No; Support System Lacking: No; Transportation Concerns: No Electronic Signature(s) Signed: 03/04/2022 4:47:38 PM By: Fredirick Maudlin MD FACS Entered By: Fredirick Maudlin on 03/04/2022 15:30:04 -------------------------------------------------------------------------------- SuperBill Details Patient Name: Date of Service: VERDIS, KOVAL 03/04/2022 Medical Record Number: [id number] Patient Account Number: [id number] Date of Birth/Sex: Treating RN: 1951/03/12 (71 y.o. 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) I10 Essential (primary) hypertension I25.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 I89.0 Lymphedema, not elsewhere classified Z86.718 Personal 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 Facility Procedures CPT4 Code: [id number] Description: [place] - DEB SUBQ TISSUE 20 SQ CM/< ICD-10 Diagnosis Description 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 L97.522 Non-pressure chronic ulcer of other part of left foot with fat layer exposed Modifier: ed Quantity: 1 CPT4 Code: [id number] Description: 55374 - DEBRIDE WOUND 1ST 20 SQ CM OR < ICD-10 Diagnosis Description L97.512 Non-pressure chronic ulcer of other part of right foot with fat layer exposed Modifier: Quantity: 1 Physician Procedures : CPT4 Code Description Modifier [id number] - WC PHYS LEVEL 4 - EST PT 25 ICD-10 Diagnosis Description 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d L97.522 Non-pressure chronic ulcer of other part of left  foot with fat layer exposed L97.512 Non-pressure chronic ulcer of other part of right foot with fat layer exposed I89.0 Lymphedema, not elsewhere classified Quantity: 1 : [id number] [id number] - WC PHYS SUBQ TISS 20 SQ CM ICD-10 Diagnosis Description 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

## 2022-03-08 ENCOUNTER — Telehealth: Payer: Self-pay | Admitting: Physician Assistant

## 2022-03-08 DIAGNOSIS — Z114 Encounter for screening for human immunodeficiency virus [HIV]: Secondary | ICD-10-CM

## 2022-03-08 DIAGNOSIS — D539 Nutritional anemia, unspecified: Secondary | ICD-10-CM

## 2022-03-08 DIAGNOSIS — D696 Thrombocytopenia, unspecified: Secondary | ICD-10-CM

## 2022-03-08 DIAGNOSIS — Z7289 Other problems related to lifestyle: Secondary | ICD-10-CM

## 2022-03-08 NOTE — Telephone Encounter (Signed)
I called [name] to review the labs from 03/03/2022. 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 Patient reports he is currently taking B complex and folic acid. Recommend additional serologic workup this week to evaluate for underlying cause. If labs are unremarkable, then we will order abdominal US to evaluate for liver disease and/or splenomegaly. Patient will come this Thursday, 03/11/2022 for serologic workup. He expressed understanding of the plan provided.

## 2022-03-10 ENCOUNTER — Encounter (HOSPITAL_BASED_OUTPATIENT_CLINIC_OR_DEPARTMENT_OTHER): Payer: Medicare Other | Admitting: General Surgery

## 2022-03-10 DIAGNOSIS — L97812 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d: Secondary | ICD-10-CM | POA: Diagnosis not present

## 2022-03-10 DIAGNOSIS — I110 Hypertensive heart disease with heart failure: Secondary | ICD-10-CM | POA: Diagnosis not present

## 2022-03-10 DIAGNOSIS — L97522 Non-pressure chronic ulcer of other part of left foot with fat layer exposed: Secondary | ICD-10-CM | POA: Diagnosis not present

## 2022-03-10 DIAGNOSIS — I890 Lymphedema, not elsewhere classified: Secondary | ICD-10-CM | POA: Diagnosis not present

## 2022-03-10 DIAGNOSIS — I872 Venous insufficiency (chronic) (peripheral): Secondary | ICD-10-CM | POA: Diagnosis not present

## 2022-03-10 DIAGNOSIS — I251 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: Secondary | ICD-10-CM | POA: Diagnosis not present

## 2022-03-11 ENCOUNTER — Inpatient Hospital Stay: Payer: Medicare Other

## 2022-03-11 NOTE — Progress Notes (Signed)
[name], [name] ([id number]) Visit Report for 03/10/2022 Chief Complaint Document Details Patient Name: Date of Service: [name], [name] 03/10/2022 1:15 PM Medical Record Number: [id number] Patient Account Number: [id number] Date of Birth/Sex: Treating RN: 11/08/1950 (71 y.o. [name] Primary Care Provider: PA [name], [place] Other Clinician: Referring Provider: Treating Provider/Extender: [name] in Treatment: 4 Information Obtained from: Patient Chief Complaint Patient seen for complaints of Non-Healing Wounds. Electronic Signature(s) Signed: 03/10/2022 2:37:19 PM By: Fredirick Maudlin MD FACS Entered By: Fredirick Maudlin on 03/10/2022 14:37:18 -------------------------------------------------------------------------------- Debridement Details Patient Name: Date of Service: [name], [name] 03/10/2022 1:15 PM Medical Record Number: [id number] Patient Account Number: [id number] Date of Birth/Sex: Treating RN: 1951-04-09 (71 y.o. [name] Primary Care Provider: PA [name], [place] Other Clinician: Referring Provider: Treating Provider/Extender: [name] in Treatment: 4 Debridement Performed for Assessment: Wound #3 Right,Medial Lower Leg Performed By: Physician Fredirick Maudlin, MD Debridement Type: Debridement Level of Consciousness (Pre-procedure): Awake and Alert Pre-procedure Verification/Time Out Yes - 13:40 Taken: Start Time: 13:42 Pain Control: Lidocaine 4% T opical Solution T Area Debrided (L x W): otal 2.7 (cm) x 4.2 (cm) = 11.34 (cm) Tissue and other material debrided: Viable, Slough, Subcutaneous, Slough, Other: calcium deposits Level: Skin/Subcutaneous Tissue Debridement Description: Excisional Instrument: Curette Bleeding: Minimum Hemostasis Achieved: Pressure Procedural Pain: 2 Post Procedural Pain: 1 Response to Treatment: Procedure was tolerated well Level of Consciousness (Post- Awake and  Alert procedure): Post Debridement Measurements of Total Wound Length: (cm) 2.7 Width: (cm) 4.2 Depth: (cm) 1.1 Volume: (cm) 9.797 Character of Wound/Ulcer Post Debridement: Requires Further Debridement Post Procedure Diagnosis Same as Pre-procedure Electronic Signature(s) Signed: 03/10/2022 2:43:32 PM By: Fredirick Maudlin MD FACS Signed: 03/10/2022 5:58:34 PM By: Baruch Gouty RN, BSN Entered By: Baruch Gouty on 03/10/2022 13:48:24 -------------------------------------------------------------------------------- Debridement Details Patient Name: Date of Service: [name], [name] 03/10/2022 1:15 PM Medical Record Number: [id number] Patient Account Number: [id number] Date of Birth/Sex: Treating RN: 07-Jan-1951 (71 y.o. [name] Primary Care Provider: PA [name], [place] Other Clinician: Referring Provider: Treating Provider/Extender: [name] in Treatment: 4 Debridement Performed for Assessment: Wound #2 Right,Lateral Foot Performed By: Physician Fredirick Maudlin, MD Debridement Type: Debridement Level of Consciousness (Pre-procedure): Awake and Alert Pre-procedure Verification/Time Out Yes - 13:40 Taken: Start Time: 13:42 Pain Control: Lidocaine 4% T opical Solution T Area Debrided (L x W): otal 2.4 (cm) x 1.1 (cm) = 2.64 (cm) Tissue and other material debrided: Non-Viable, Eschar, Slough, Slough Level: Non-Viable Tissue Debridement Description: Selective/Open Wound Instrument: Curette Bleeding: Minimum Hemostasis Achieved: Pressure Procedural Pain: 2 Post Procedural Pain: 1 Response to Treatment: Procedure was tolerated well Level of Consciousness (Post- Awake and Alert procedure): Post Debridement Measurements of Total Wound Length: (cm) 2.4 Width: (cm) 1.1 Depth: (cm) 0.1 Volume: (cm) 0.207 Character of Wound/Ulcer Post Debridement: Improved Post Procedure Diagnosis Same as Pre-procedure Electronic Signature(s) Signed:  03/10/2022 2:43:32 PM By: Fredirick Maudlin MD FACS Signed: 03/10/2022 5:58:34 PM By: Baruch Gouty RN, BSN Entered By: Baruch Gouty on 03/10/2022 13:49:56 -------------------------------------------------------------------------------- Debridement Details Patient Name: Date of Service: [name], [name] 03/10/2022 1:15 PM Medical Record Number: [id number] Patient Account Number: [id number] Date of Birth/Sex: Treating RN: Dec 02, 1950 (71 y.o. [name] Primary Care Provider: PA [name], [place] Other Clinician: Referring Provider: Treating Provider/Extender: [name] in Treatment: 4 Debridement Performed for Assessment: Wound #1 Left,Dorsal Foot Performed By: Physician Fredirick Maudlin, MD Debridement Type: Debridement Level of Consciousness (Pre-procedure): Awake and Alert Pre-procedure Verification/Time  Out Yes - 13:40 Taken: Start Time: 13:42 Pain Control: Lidocaine 4% T opical Solution T Area Debrided (L x W): otal 2.4 (cm) x 2.4 (cm) = 5.76 (cm) Tissue and other material debrided: Non-Viable, Slough, Subcutaneous, Slough Level: Skin/Subcutaneous Tissue Debridement Description: Excisional Instrument: Curette Bleeding: Minimum Hemostasis Achieved: Pressure Procedural Pain: 2 Post Procedural Pain: 1 Response to Treatment: Procedure was tolerated well Level of Consciousness (Post- Awake and Alert procedure): Post Debridement Measurements of Total Wound Length: (cm) 2.4 Width: (cm) 2.4 Depth: (cm) 0.5 Volume: (cm) 2.262 Character of Wound/Ulcer Post Debridement: Requires Further Debridement Post Procedure Diagnosis Same as Pre-procedure Electronic Signature(s) Signed: 03/10/2022 2:43:32 PM By: Fredirick Maudlin MD FACS Signed: 03/10/2022 5:58:34 PM By: Baruch Gouty RN, BSN Entered By: Baruch Gouty on 03/10/2022 13:52:03 -------------------------------------------------------------------------------- HPI Details Patient Name: Date of  Service: [name], [name] 03/10/2022 1:15 PM Medical Record Number: [id number] Patient Account Number: [id number] Date of Birth/Sex: Treating RN: [birth year] (71 y.o. [name] Primary Care Provider: PA [name], [place] Other Clinician: Referring Provider: Treating Provider/Extender: [name] in Treatment: 4 History of Present Illness HPI Description: ADMISSION 02/09/2022 This is a 71 year old man who recently moved to [place] from [place]. He has a history of of coronary artery disease and prior left popliteal vein DVT . He is not diabetic and does not smoke. 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 He has had multiple spinal surgeries and has limited mobility. He primarily uses a motorized wheelchair. He has had lymphedema for many years and does have lymphedema pumps although he says he does not use them frequently. He has wounds on his bilateral lower extremities. He was receiving care in [place] for these prior to his move. They were apparently packing his wounds with Betadine soaked gauze. He has worn compression wraps in the past but not recently. He did say that they helped tremendously. In clinic today, his right ABI is noncompressible but has good signals; the left ABI was 1.17. No formal vascular studies have been performed. On his left dorsal foot, there is a circular lesion that exposes the fat layer. There is fairly heavy slough accumulation within the wound, but no significant odor. On his right medial calf, he has multiple pinholes that have slough buildup in them and probe for about 2 to 4 mm in depth. They are draining clear fluid. On his right lateral midfoot, he has ulceration consistent with venous stasis. It is geographic and has slough accumulation. 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 02/17/2022: All of the wounds are little bit larger  today and have more slough accumulation. Today, the medial calf openings are larger and probe to something that feels calcified. There is odor coming from his wounds. His vascular studies are scheduled for August 9 and 10. 02-24-2022 upon evaluation today patient presents for follow-up concerning his ongoing issues here with his lower extremities. With that being said he does have significant problems with what appears to be cellulitis unfortunately the PCR culture that was sent last week got crushed by UPS in transit. With that being said we are going to reobtain a culture today although I am actually to do it on the right medial leg as this seems to be the most inflamed area today where there is some questionable drainage as well. I am going to remove some of the calcium deposits which are loosening obtain a  deeper culture from this area. Fortunately I do not see any evidence of active infection systemically which is good news but locally I think we are having a bigger issue here. He does have his appointment next Thursday with vascular. Patient has also been referred to [facility] for further evaluation and treatment of the calcinosis for possible sulfate injections. 03/04/2022: The culture that was performed last week grew out multiple species including Klebsiella, Morganella, and Pseudomonas aeruginosa. He had been prescribed Bactrim but this was not adequate to cover all of the species and levofloxacin was recommended. He completed the Bactrim but did not pick up the levofloxacin and begin taking it until yesterday. 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 He had arterial studies performed today. He does have evidence of peripheral arterial disease. Results copied  here: +-------+-----------+-----------+------------+------------+ ABI/TBIT oday's ABIT oday's TBIPrevious ABIPrevious TBI +-------+-----------+-----------+------------+------------+ Right [place] 0.75    +-------+-----------+-----------+------------+------------+ Left [place] 0.57    +-------+-----------+-----------+------------+------------+ Arterial wall calcification precludes accurate ankle pressures and ABIs. Summary: Right: Resting right ankle-brachial index indicates noncompressible right lower extremity arteries. The right toe-brachial index is normal. Left: Resting left ankle-brachial index indicates noncompressible left lower extremity arteries. The left toe-brachial index is abnormal. He continues to have further tissue breakdown at the right medial calf and there is ample slough accumulation along with necrotic subcutaneous tissue. The left dorsal foot wound has built up slough, as well. The right medial foot wound is nearly closed with just a light layer of eschar over the surface. The right dorsal lateral foot wound has also accumulated a fair amount of slough and remains quite tender. 03/10/2022: He has been taking the prescribed levofloxacin and has about 3 days left of this. The erythema and induration on his right leg has improved. He continues to experience further breakdown of the right medial calf wound. There is extensive slough and debris accumulation there. There is heavy slough on his left dorsal foot wound, but no odor or purulent drainage. The right medial foot wound appears to be closed. The right dorsal lateral foot wound also has a fair amount of slough but it is less tender than at our last visit. He still does not have an appointment with dermatology. Electronic Signature(s) Signed: 03/10/2022 2:39:32 PM By: Fredirick Maudlin MD FACS Entered By: Fredirick Maudlin on 03/10/2022  14:39:32 -------------------------------------------------------------------------------- Physical Exam Details Patient Name: Date of Service: [name], [name] 03/10/2022 1:15 PM Medical Record Number: [id number] Patient Account Number: [id number] Date of Birth/Sex: Treating RN: [birth year] (71 y.o. [name] Primary Care Provider: PA [name], [place] Other Clinician: Referring Provider: Treating Provider/Extender: Valeria Batman Weeks in Treatment: 4 Constitutional . . . . No acute distress.Marland Kitchen Respiratory Normal work of breathing on room air.. Notes 03/10/2022: The erythema and induration on his right leg has improved. He continues to experience further breakdown of the right medial calf wound. There is extensive slough and debris accumulation there. There is heavy slough on his left dorsal foot wound, but no odor or purulent drainage. The right medial foot wound appears to be closed. The right dorsal lateral foot wound also has a fair amount of slough but it is less tender than at our last visit Electronic Signature(s) Signed: 03/10/2022 2:40:18 PM By: Fredirick Maudlin MD FACS Entered By: Fredirick Maudlin on 03/10/2022 14:40:18 -------------------------------------------------------------------------------- Physician Orders Details Patient Name: Date of Service:  [name], [name] 03/10/2022 1:15 PM Medical Record Number: [id number] Patient Account Number: [id number] Date of Birth/Sex: Treating RN: November 08, 1950 (71 y.o. [name] Primary Care Provider: PA [name], [place] Other Clinician: Referring Provider: Treating Provider/Extender: [name] in Treatment: 4 Verbal / Phone Orders: No Diagnosis Coding ICD-10 Coding Code Description [phone] 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d L97.522 Non-pressure chronic ulcer of other part of left foot with fat layer exposed 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)  (peripheral) I10 Essential (primary) hypertension I25.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 I89.0 Lymphedema, not elsewhere classified Z86.718 Personal 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 Follow-up Appointments ppointment in 1 week. - Dr. [name] Rm 1 Return A Wednesday 8/28 at 2:00 pm Anesthetic Wound #1 Left,Dorsal Foot (In clinic) Topical Lidocaine 4% applied to wound bed Wound #2 Right,Lateral Foot (In clinic) Topical Lidocaine 4% applied to wound bed Wound #3 Right,Medial Lower Leg (In clinic) Topical Lidocaine 4% applied to wound bed Wound #4 Right,Medial Foot (In clinic) Topical Lidocaine 4% applied to wound bed Wound #5 Right,Anterior Lower Leg (In clinic) Topical Lidocaine 4% applied to wound bed Bathing/ Shower/ Hygiene May shower with protection but do not get wound dressing(s) wet. Edema Control - Lymphedema / SCD / Other Lymphedema Pumps. Use Lymphedema pumps on leg(s) 2-3 times a day for 45-60 minutes. If wearing any wraps or hose, do not remove them. Continue exercising as instructed. - use 1-2 times per day over wraps Elevate legs to the level of the heart or above for 30 minutes daily and/or when sitting, a frequency of: Exercise regularly Wound Treatment Wound #1 - Foot Wound Laterality: Dorsal, Left Peri-Wound Care: Sween Lotion (Moisturizing lotion) 1 x Per Week Discharge Instructions: Apply moisturizing lotion as directed Topical: Gentamicin 1 x Per Week Discharge Instructions: As directed by physician Prim Dressing: KerraCel Ag Gelling Fiber Dressing, 4x5 in (silver alginate) 1 x Per Week ary Discharge Instructions: Apply silver alginate to wound bed as instructed Prim Dressing: Optifoam Non-Adhesive Dressing, 4x4 in 1 x Per Week ary Discharge Instructions: Apply to wound bed as instructed Secondary Dressing: ABD  Pad, 8x10 1 x Per Week Discharge Instructions: Apply over primary dressing as directed. Secondary Dressing: Woven Gauze Sponge, Non-Sterile 4x4 in 1 x Per Week Discharge Instructions: Apply over primary dressing as directed. Compression Wrap: ThreePress (3 layer compression wrap) 1 x Per Week Discharge Instructions: Apply three layer compression as directed. Wound #2 - Foot Wound Laterality: Right, Lateral Peri-Wound Care: Sween Lotion (Moisturizing lotion) 1 x Per Week Discharge Instructions: Apply moisturizing lotion as directed Topical: Gentamicin 1 x Per Week Discharge Instructions: As directed by physician Prim Dressing: KerraCel Ag Gelling Fiber Dressing, 4x5 in (silver alginate) 1 x Per Week ary Discharge Instructions: Apply silver alginate to wound bed as instructed Prim Dressing: Optifoam Non-Adhesive Dressing, 4x4 in 1 x Per Week ary Discharge Instructions: Apply to wound bed as instructed Secondary Dressing: ABD Pad, 8x10 1 x Per Week Discharge Instructions: Apply over primary dressing as directed. Secondary Dressing: Woven Gauze Sponge, Non-Sterile 4x4 in 1 x Per Week Discharge Instructions: Apply over primary dressing as directed. Compression Wrap: ThreePress (3 layer compression wrap) 1 x Per Week Discharge Instructions: Apply three layer compression as directed. Wound #3 - Lower Leg Wound Laterality: Right, Medial Peri-Wound Care: Sween Lotion (Moisturizing lotion) 1 x Per Week  Discharge Instructions: Apply moisturizing lotion as directed Topical: Gentamicin 1 x Per Week Discharge Instructions: As directed by physician Prim Dressing: KerraCel Ag Gelling Fiber Dressing, 4x5 in (silver alginate) 1 x Per Week ary Discharge Instructions: Apply silver alginate to wound bed as instructed Prim Dressing: Optifoam Non-Adhesive Dressing, 4x4 in 1 x Per Week ary Discharge Instructions: Apply to wound bed as instructed Secondary Dressing: ABD Pad, 8x10 1 x Per Week Discharge  Instructions: Apply over primary dressing as directed. Secondary Dressing: Woven Gauze Sponge, Non-Sterile 4x4 in 1 x Per Week Discharge Instructions: Apply over primary dressing as directed. Compression Wrap: ThreePress (3 layer compression wrap) 1 x Per Week Discharge Instructions: Apply three layer compression as directed. Wound #4 - Foot Wound Laterality: Right, Medial Peri-Wound Care: Sween Lotion (Moisturizing lotion) 1 x Per Week Discharge Instructions: Apply moisturizing lotion as directed Topical: Gentamicin 1 x Per Week Discharge Instructions: As directed by physician Prim Dressing: KerraCel Ag Gelling Fiber Dressing, 4x5 in (silver alginate) 1 x Per Week ary Discharge Instructions: Apply silver alginate to wound bed as instructed Prim Dressing: Optifoam Non-Adhesive Dressing, 4x4 in 1 x Per Week ary Discharge Instructions: Apply to wound bed as instructed Secondary Dressing: ABD Pad, 8x10 1 x Per Week Discharge Instructions: Apply over primary dressing as directed. Secondary Dressing: Woven Gauze Sponge, Non-Sterile 4x4 in 1 x Per Week Discharge Instructions: Apply over primary dressing as directed. Compression Wrap: ThreePress (3 layer compression wrap) 1 x Per Week Discharge Instructions: Apply three layer compression as directed. Wound #5 - Lower Leg Wound Laterality: Right, Anterior Peri-Wound Care: Sween Lotion (Moisturizing lotion) 1 x Per Week Discharge Instructions: Apply moisturizing lotion as directed Topical: Gentamicin 1 x Per Week Discharge Instructions: As directed by physician Prim Dressing: KerraCel Ag Gelling Fiber Dressing, 4x5 in (silver alginate) 1 x Per Week ary Discharge Instructions: Apply silver alginate to wound bed as instructed Prim Dressing: Optifoam Non-Adhesive Dressing, 4x4 in 1 x Per Week ary Discharge Instructions: Apply to wound bed as instructed Secondary Dressing: ABD Pad, 8x10 1 x Per Week Discharge Instructions: Apply over primary  dressing as directed. Secondary Dressing: Woven Gauze Sponge, Non-Sterile 4x4 in 1 x Per Week Discharge Instructions: Apply over primary dressing as directed. Compression Wrap: ThreePress (3 layer compression wrap) 1 x Per Week Discharge Instructions: Apply three layer compression as directed. Electronic Signature(s) Signed: 03/10/2022 2:43:32 PM By: Fredirick Maudlin MD FACS Entered By: Fredirick Maudlin on 03/10/2022 14:40:35 -------------------------------------------------------------------------------- Problem List Details Patient Name: Date of Service: [name], [name] 03/10/2022 1:15 PM Medical Record Number: [id number] Patient Account Number: [id number] Date of Birth/Sex: Treating RN: 27-Dec-1950 (71 y.o. [name] Primary Care Provider: PA [name], [place] Other Clinician: Referring Provider: Treating Provider/Extender: [name] in Treatment: 4 Active Problems ICD-10 Encounter Code Description Active Date MDM Diagnosis L97.812 Non-pressure chronic ulcer of other part of right lower leg with fat layer 02/09/2022 No Yes exposed L97.522 Non-pressure chronic ulcer of other part of left foot with fat layer exposed 02/09/2022 No Yes 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) 02/09/2022 No Yes I10 Essential (primary) hypertension 02/09/2022 No Yes I25.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 02/09/2022 No Yes I89.0 Lymphedema, not elsewhere classified 02/09/2022 No Yes Z86.718 Personal history of other venous thrombosis and embolism 02/09/2022 No Yes L97.512 Non-pressure chronic ulcer of other part of right foot with fat layer exposed 02/17/2022 No Yes Inactive Problems Resolved Problems Electronic Signature(s) Signed: 03/10/2022 2:34:45 PM By: Fredirick Maudlin MD FACS  Entered By: Fredirick Maudlin on 03/10/2022 14:34:45 -------------------------------------------------------------------------------- Progress Note  Details Patient Name: Date of Service: [name], [name] 03/10/2022 1:15 PM Medical Record Number: [id number] Patient Account Number: [id number] Date of Birth/Sex: Treating RN: August 01, 1950 (71 y.o. [name] Primary Care Provider: PA [name], [place] Other Clinician: Referring Provider: Treating Provider/Extender: [name] in Treatment: 4 Subjective Chief Complaint Information obtained from Patient Patient seen for complaints of Non-Healing Wounds. History of Present Illness (HPI) ADMISSION 02/09/2022 This is a 71 year old man who recently moved to [place] from [place]. He has a history of of coronary artery disease and prior left popliteal vein DVT . He is not diabetic and does not smoke. 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 He has had multiple spinal surgeries and has limited mobility. He primarily uses a motorized wheelchair. He has had lymphedema for many years and does have lymphedema pumps although he says he does not use them frequently. He has wounds on his bilateral lower extremities. He was receiving care in [place] for these prior to his move. They were apparently packing his wounds with Betadine soaked gauze. He has worn compression wraps in the past but not recently. He did say that they helped tremendously. In clinic today, his right ABI is noncompressible but has good signals; the left ABI was 1.17. No formal vascular studies have been performed. On his left dorsal foot, there is a circular lesion that exposes the fat layer. There is fairly heavy slough accumulation within the wound, but no significant odor. On his right medial calf, he has multiple pinholes that have slough buildup in them and probe for about 2 to 4 mm in depth. They are draining clear fluid. On his right lateral midfoot, he has ulceration consistent with venous stasis. It is geographic and has slough accumulation. He has changes in his lower  extremities consistent with stasis dermatitis, but no hemosiderin deposition or thickening of the skin. 02/17/2022: All of the wounds are little bit larger today and have more slough accumulation. Today, the medial calf openings are larger and probe to something that feels calcified. There is odor coming from his wounds. His vascular studies are scheduled for August 9 and 10. 02-24-2022 upon evaluation today patient presents for follow-up concerning his ongoing issues here with his lower extremities. With that being said he does have significant problems with what appears to be cellulitis unfortunately the PCR culture that was sent last week got crushed by UPS in transit. With that being said we are going to reobtain a culture today although I am actually to do it on the right medial leg as this seems to be the most inflamed area today where there is some questionable drainage as well. I am going to remove some of the calcium deposits which are loosening obtain a deeper culture from this area. Fortunately I do not see any evidence of active infection systemically which is good news but locally I think we are having a bigger issue here. He does have his appointment next Thursday with vascular. Patient has also been referred to [facility] for further evaluation and treatment of the calcinosis for possible sulfate injections. 03/04/2022: The culture that was performed last week grew out multiple species including Klebsiella, Morganella, and Pseudomonas aeruginosa. He had been prescribed Bactrim but this was not adequate to cover all of the species and levofloxacin was recommended. He completed the Bactrim but did not pick up the levofloxacin and begin taking it  until yesterday. 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 He had arterial  studies performed today. He does have evidence of peripheral arterial disease. Results copied here: +-------+-----------+-----------+------------+------------+ ABI/TBIT oday's ABIT oday's TBIPrevious ABIPrevious TBI +-------+-----------+-----------+------------+------------+ Right [place] 0.75    +-------+-----------+-----------+------------+------------+ Left [place] 0.57    +-------+-----------+-----------+------------+------------+ Arterial wall calcification precludes accurate ankle pressures and ABIs. Summary: Right: Resting right ankle-brachial index indicates noncompressible right lower extremity arteries. The right toe-brachial index is normal. Left: Resting left ankle-brachial index indicates noncompressible left lower extremity arteries. The left toe-brachial index is abnormal. He continues to have further tissue breakdown at the right medial calf and there is ample slough accumulation along with necrotic subcutaneous tissue. The left dorsal foot wound has built up slough, as well. The right medial foot wound is nearly closed with just a light layer of eschar over the surface. The right dorsal lateral foot wound has also accumulated a fair amount of slough and remains quite tender. 03/10/2022: He has been taking the prescribed levofloxacin and has about 3 days left of this. The erythema and induration on his right leg has improved. He continues to experience further breakdown of the right medial calf wound. There is extensive slough and debris accumulation there. There is heavy slough on his left dorsal foot wound, but no odor or purulent drainage. The right medial foot wound appears to be closed. The right dorsal lateral foot wound also has a fair amount of slough but it is less tender than at our last visit. He still does not have an appointment with dermatology. Patient History Information obtained from Patient, Chart. Family History Cancer - Mother, Heart Disease -  Father,Mother, Hypertension - Father,Siblings, Lung Disease - Siblings, No family history of Diabetes, Hereditary Spherocytosis, Kidney Disease, Seizures, Stroke, Thyroid Problems, Tuberculosis. Social History Former smoker - quit 1991, Marital Status - Married, Alcohol Use - Moderate, Drug Use - No History, Caffeine Use - Daily - coffee. Medical History Eyes Patient has history of Cataracts - right eye removed Denies history of Glaucoma, Optic Neuritis Ear/Nose/Mouth/Throat Denies history of Chronic sinus problems/congestion, Middle ear problems Hematologic/Lymphatic Patient has history of Anemia - macrocytic, Lymphedema Cardiovascular Patient has history of Congestive Heart Failure, Deep Vein Thrombosis - left leg, Hypertension, Peripheral Venous Disease Endocrine Denies history of Type I Diabetes, Type II Diabetes Genitourinary Denies history of End Stage Renal Disease Integumentary (Skin) Denies history of History of Burn Oncologic Denies history of Received Chemotherapy, Received Radiation Psychiatric Denies history of Anorexia/bulimia, Confinement Anxiety Hospitalization/Surgery History - cervical fusion. - lumbar surgery. - coronary stent placement. - lasik eye surgery. - hydrocele excision. Medical A Surgical History Notes nd Constitutional Symptoms (General Health) obesity Ear/Nose/Mouth/Throat hard of hearing Respiratory frozen diaphragm right Cardiovascular hyperlipidemia Musculoskeletal myositis, lumbar DDD, spinal stenosis, cervical facet joint syndrome Objective Constitutional No acute distress.. Vitals Time Taken: 1:21 PM, Temperature: 97.8 F, Pulse: 65 bpm, Respiratory Rate: 18 breaths/min, Blood Pressure: 117/73 mmHg. Respiratory Normal work of breathing on room air.. General Notes: 03/10/2022: The erythema and induration on his right leg has improved. He continues to experience further breakdown of the right medial calf wound. There is extensive slough  and debris accumulation there. There is heavy slough on his left dorsal foot wound, but no odor or purulent drainage. The right medial foot wound appears to be closed. The right dorsal lateral foot wound also  has a fair amount of slough but it is less tender than at our last visit Integumentary (Hair, Skin) Wound #1 status is Open. Original cause of wound was Gradually Appeared. The date acquired was: 12/07/2021. The wound has been in treatment 4 weeks. The wound is located on the Left,Dorsal Foot. The wound measures 2.4cm length x 2.4cm width x 0.5cm depth; 4.524cm^2 area and 2.262cm^3 volume. There is Fat Layer (Subcutaneous Tissue) exposed. There is no tunneling or undermining noted. There is a large amount of serosanguineous drainage noted. The wound margin is well defined and not attached to the wound base. There is small (1-33%) red, pink granulation within the wound bed. There is a large (67-100%) amount of necrotic tissue within the wound bed including Adherent Slough. Wound #2 status is Open. Original cause of wound was Gradually Appeared. The date acquired was: 11/02/2021. The wound has been in treatment 4 weeks. The wound is located on the Right,Lateral Foot. The wound measures 2.4cm length x 1.1cm width x 0.1cm depth; 2.073cm^2 area and 0.207cm^3 volume. There is Fat Layer (Subcutaneous Tissue) exposed. There is no tunneling or undermining noted. There is a medium amount of serosanguineous drainage noted. The wound margin is flat and intact. There is no granulation within the wound bed. There is a large (67-100%) amount of necrotic tissue within the wound bed including Adherent Slough. Wound #3 status is Open. Original cause of wound was Gradually Appeared. The date acquired was: 12/07/2021. The wound has been in treatment 4 weeks. The wound is located on the Right,Medial Lower Leg. The wound measures 2.7cm length x 4.2cm width x 1.1cm depth; 8.906cm^2 area and 9.797cm^3 volume. There is Fat  Layer (Subcutaneous Tissue) exposed. There is no tunneling or undermining noted. There is a medium amount of serosanguineous drainage noted. The wound margin is distinct with the outline attached to the wound base. There is small (1-33%) red granulation within the wound bed. There is a large (67-100%) amount of necrotic tissue within the wound bed including Adherent Slough. Wound #4 status is Open. Original cause of wound was Gradually Appeared. The date acquired was: 02/17/2022. The wound has been in treatment 3 weeks. The wound is located on the Right,Medial Foot. The wound measures 0.1cm length x 0.1cm width x 0.1cm depth; 0.008cm^2 area and 0.001cm^3 volume. There is Fat Layer (Subcutaneous Tissue) exposed. There is no tunneling or undermining noted. There is a medium amount of serosanguineous drainage noted. The wound margin is flat and intact. There is no granulation within the wound bed. There is a large (67-100%) amount of necrotic tissue within the wound bed including Eschar. Wound #5 status is Open. Original cause of wound was Gradually Appeared. The date acquired was: 03/03/2022. The wound is located on the Right,Anterior Lower Leg. The wound measures 0.3cm length x 0.7cm width x 0.1cm depth; 0.165cm^2 area and 0.016cm^3 volume. There is Fat Layer (Subcutaneous Tissue) exposed. There is no tunneling or undermining noted. There is a medium amount of serosanguineous drainage noted. There is large (67-100%) red granulation within the wound bed. There is no necrotic tissue within the wound bed. 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 There was a Excisional Skin/Subcutaneous Tissue Debridement with a total area of 5.76 sq cm performed by Fredirick Maudlin, MD. With the following instrument(s): Curette to remove Non-Viable tissue/material. Material removed includes Subcutaneous Tissue, Slough, and Other: calcium deposits after achieving pain control using Lidocaine 4% T opical Solution. No specimens were taken. A time out was conducted at 13:40, prior to the start of the procedure. A Minimum amount of bleeding was controlled with Pressure. The procedure was tolerated well with a pain level of 2 throughout and a pain level of 1 following the procedure. Post Debridement Measurements: 2.4cm length x 2.4cm width x 0.5cm depth; 2.262cm^3 volume. Character of Wound/Ulcer Post Debridement requires further debridement. Post procedure Diagnosis Wound #1: Same as Pre-Procedure Pre-procedure diagnosis of Wound #1 is a Lymphedema located on the Left,Dorsal Foot . There was a Three Layer Compression Therapy Procedure by Baruch Gouty, RN. Post procedure Diagnosis Wound #1: Same as Pre-Procedure Wound #2 Pre-procedure diagnosis of Wound #2 is a Lymphedema located on the Right,Lateral Foot . There was a Selective/Open Wound Non-Viable Tissue Debridement with a total area of 2.64 sq cm performed by Fredirick Maudlin, MD. With the following instrument(s): Curette to remove Non-Viable tissue/material. Material removed includes [place], and Other: calcium deposits after achieving pain control using Lidocaine 4% T opical Solution. No specimens were taken. A time out was conducted at 13:40, prior to the start of the procedure. A Minimum amount of bleeding was controlled with Pressure. The procedure was tolerated well with  a pain level of 2 throughout and a pain level of 1 following the procedure. Post Debridement Measurements: 2.4cm length x 1.1cm width x 0.1cm depth; 0.207cm^3 volume. Character of Wound/Ulcer Post Debridement is improved. Post procedure Diagnosis Wound #2: Same as Pre-Procedure Wound #3 Pre-procedure diagnosis of Wound #3 is a Lymphedema located on the Right,Medial Lower Leg . There was a Excisional Skin/Subcutaneous Tissue Debridement with a total area of 11.34 sq cm performed by Fredirick Maudlin, MD. With the following instrument(s): Curette to remove Viable tissue/material. Material removed includes Subcutaneous Tissue, Slough, and Other: calcium deposits after achieving pain control using Lidocaine 4% T opical Solution. No specimens were taken. A time out was conducted at 13:40, prior to the start of the procedure. A Minimum amount of bleeding was controlled with Pressure. The procedure was tolerated well with a pain level of 2 throughout and a pain level of 1 following the procedure. Post Debridement Measurements: 2.7cm length x 4.2cm width x 1.1cm depth; 9.797cm^3 volume. Character of Wound/Ulcer Post Debridement requires further debridement. Post procedure Diagnosis Wound #3: Same as Pre-Procedure Pre-procedure diagnosis of Wound #3 is a Lymphedema located on the Right,Medial Lower Leg . There was a Three Layer Compression Therapy Procedure by Baruch Gouty, RN. Post procedure Diagnosis Wound #3: Same as Pre-Procedure Plan Follow-up Appointments: Return Appointment in 1 week. - Dr. [name] Rm 1 Wednesday 8/28 at 2:00 pm Anesthetic: Wound #1 Left,Dorsal Foot: (In clinic) Topical Lidocaine 4% applied to wound bed Wound #2 Right,Lateral Foot: (In clinic) Topical Lidocaine 4% applied to wound bed Wound #3 Right,Medial Lower Leg: (In clinic) Topical Lidocaine 4% applied to wound bed Wound #4 Right,Medial Foot: (In clinic) Topical Lidocaine 4% applied to wound bed Wound #5  Right,Anterior Lower Leg: (In clinic) Topical Lidocaine 4% applied to wound bed Bathing/ Shower/ Hygiene: May shower with protection but do  not get wound dressing(s) wet. Edema Control - Lymphedema / SCD / Other: Lymphedema Pumps. Use Lymphedema pumps on leg(s) 2-3 times a day for 45-60 minutes. If wearing any wraps or hose, do not remove them. Continue exercising as instructed. - use 1-2 times per day over wraps Elevate legs to the level of the heart or above for 30 minutes daily and/or when sitting, a frequency of: Exercise regularly WOUND #1: - Foot Wound Laterality: Dorsal, Left Peri-Wound Care: Sween Lotion (Moisturizing lotion) 1 x Per Week/ Discharge Instructions: Apply moisturizing lotion as directed Topical: Gentamicin 1 x Per Week/ Discharge Instructions: As directed by physician Prim Dressing: KerraCel Ag Gelling Fiber Dressing, 4x5 in (silver alginate) 1 x Per Week/ ary Discharge Instructions: Apply silver alginate to wound bed as instructed Prim Dressing: Optifoam Non-Adhesive Dressing, 4x4 in 1 x Per Week/ ary Discharge Instructions: Apply to wound bed as instructed Secondary Dressing: ABD Pad, 8x10 1 x Per Week/ Discharge Instructions: Apply over primary dressing as directed. Secondary Dressing: Woven Gauze Sponge, Non-Sterile 4x4 in 1 x Per Week/ Discharge Instructions: Apply over primary dressing as directed. Com pression Wrap: ThreePress (3 layer compression wrap) 1 x Per Week/ Discharge Instructions: Apply three layer compression as directed. WOUND #2: - Foot Wound Laterality: Right, Lateral Peri-Wound Care: Sween Lotion (Moisturizing lotion) 1 x Per Week/ Discharge Instructions: Apply moisturizing lotion as directed Topical: Gentamicin 1 x Per Week/ Discharge Instructions: As directed by physician Prim Dressing: KerraCel Ag Gelling Fiber Dressing, 4x5 in (silver alginate) 1 x Per Week/ ary Discharge Instructions: Apply silver alginate to wound bed as  instructed Prim Dressing: Optifoam Non-Adhesive Dressing, 4x4 in 1 x Per Week/ ary Discharge Instructions: Apply to wound bed as instructed Secondary Dressing: ABD Pad, 8x10 1 x Per Week/ Discharge Instructions: Apply over primary dressing as directed. Secondary Dressing: Woven Gauze Sponge, Non-Sterile 4x4 in 1 x Per Week/ Discharge Instructions: Apply over primary dressing as directed. Com pression Wrap: ThreePress (3 layer compression wrap) 1 x Per Week/ Discharge Instructions: Apply three layer compression as directed. WOUND #3: - Lower Leg Wound Laterality: Right, Medial Peri-Wound Care: Sween Lotion (Moisturizing lotion) 1 x Per Week/ Discharge Instructions: Apply moisturizing lotion as directed Topical: Gentamicin 1 x Per Week/ Discharge Instructions: As directed by physician Prim Dressing: KerraCel Ag Gelling Fiber Dressing, 4x5 in (silver alginate) 1 x Per Week/ ary Discharge Instructions: Apply silver alginate to wound bed as instructed Prim Dressing: Optifoam Non-Adhesive Dressing, 4x4 in 1 x Per Week/ ary Discharge Instructions: Apply to wound bed as instructed Secondary Dressing: ABD Pad, 8x10 1 x Per Week/ Discharge Instructions: Apply over primary dressing as directed. Secondary Dressing: Woven Gauze Sponge, Non-Sterile 4x4 in 1 x Per Week/ Discharge Instructions: Apply over primary dressing as directed. Com pression Wrap: ThreePress (3 layer compression wrap) 1 x Per Week/ Discharge Instructions: Apply three layer compression as directed. WOUND #4: - Foot Wound Laterality: Right, Medial Peri-Wound Care: Sween Lotion (Moisturizing lotion) 1 x Per Week/ Discharge Instructions: Apply moisturizing lotion as directed Topical: Gentamicin 1 x Per Week/ Discharge Instructions: As directed by physician Prim Dressing: KerraCel Ag Gelling Fiber Dressing, 4x5 in (silver alginate) 1 x Per Week/ ary Discharge Instructions: Apply silver alginate to wound bed as instructed Prim  Dressing: Optifoam Non-Adhesive Dressing, 4x4 in 1 x Per Week/ ary Discharge Instructions: Apply to wound bed as instructed Secondary Dressing: ABD Pad, 8x10 1 x Per Week/ Discharge Instructions: Apply over primary dressing as directed. Secondary Dressing: Woven Gauze Sponge, Non-Sterile  4x4 in 1 x Per Week/ Discharge Instructions: Apply over primary dressing as directed. Com pression Wrap: ThreePress (3 layer compression wrap) 1 x Per Week/ Discharge Instructions: Apply three layer compression as directed. WOUND #5: - Lower Leg Wound Laterality: Right, Anterior Peri-Wound Care: Sween Lotion (Moisturizing lotion) 1 x Per Week/ Discharge Instructions: Apply moisturizing lotion as directed Topical: Gentamicin 1 x Per Week/ Discharge Instructions: As directed by physician Prim Dressing: KerraCel Ag Gelling Fiber Dressing, 4x5 in (silver alginate) 1 x Per Week/ ary Discharge Instructions: Apply silver alginate to wound bed as instructed Prim Dressing: Optifoam Non-Adhesive Dressing, 4x4 in 1 x Per Week/ ary Discharge Instructions: Apply to wound bed as instructed Secondary Dressing: ABD Pad, 8x10 1 x Per Week/ Discharge Instructions: Apply over primary dressing as directed. Secondary Dressing: Woven Gauze Sponge, Non-Sterile 4x4 in 1 x Per Week/ Discharge Instructions: Apply over primary dressing as directed. Com pression Wrap: ThreePress (3 layer compression wrap) 1 x Per Week/ Discharge Instructions: Apply three layer compression as directed. 03/10/2022: The erythema and induration on his right leg has improved. He continues to experience further breakdown of the right medial calf wound. There is extensive slough and debris accumulation there. There is heavy slough on his left dorsal foot wound, but no odor or purulent drainage. The right medial foot wound appears to be closed. The right dorsal lateral foot wound also has a fair amount of slough but it is less tender than at our last  visit. I used a curette to debride slough and nonviable subcutaneous tissue, as well as chunks of calcified material from his right medial foot wound. I debrided slough from the left dorsal foot wound and the right dorsal lateral foot wound. We will continue using topical gentamicin to all of the wounds and he will complete his course of oral levofloxacin. We will continue to use silver alginate with Kerlix and Coban wrap. We will work on trying to get him an appointment with dermatology for sodium thiosulfate injections. Follow-up in 1 week. Electronic Signature(s) Signed: 03/10/2022 2:41:41 PM By: Fredirick Maudlin MD FACS Entered By: Fredirick Maudlin on 03/10/2022 14:41:40 -------------------------------------------------------------------------------- HxROS Details Patient Name: Date of Service: [name], [name] 03/10/2022 1:15 PM Medical Record Number: [id number] Patient Account Number: [id number] Date of Birth/Sex: Treating RN: October 12, 1950 (71 y.o. [name] Primary Care Provider: PA [name], [place] Other Clinician: Referring Provider: Treating Provider/Extender: [name] in Treatment: 4 Information Obtained From Patient Chart Constitutional Symptoms (General Health) Medical History: Past Medical History Notes: obesity Eyes Medical History: Positive for: Cataracts - right eye removed Negative for: Glaucoma; Optic Neuritis Ear/Nose/Mouth/Throat Medical History: Negative for: Chronic sinus problems/congestion; Middle ear problems Past Medical History Notes: hard of hearing Hematologic/Lymphatic Medical History: Positive for: Anemia - macrocytic; Lymphedema Respiratory Medical History: Past Medical History Notes: frozen diaphragm right Cardiovascular Medical History: Positive for: Congestive Heart Failure; Deep Vein Thrombosis - left leg; Hypertension; Peripheral Venous Disease Past Medical History Notes: hyperlipidemia Endocrine Medical  History: Negative for: Type I Diabetes; Type II Diabetes Genitourinary Medical History: Negative for: End Stage Renal Disease Integumentary (Skin) Medical History: Negative for: History of Burn Musculoskeletal Medical History: Past Medical History Notes: myositis, lumbar DDD, spinal stenosis, cervical facet joint syndrome Oncologic Medical History: Negative for: Received Chemotherapy; Received Radiation Psychiatric Medical History: Negative for: Anorexia/bulimia; Confinement Anxiety HBO Extended History Items Eyes: Cataracts Immunizations Pneumococcal Vaccine: Received Pneumococcal Vaccination: Yes Received Pneumococcal Vaccination On or After 60th Birthday: Yes Implantable Devices No devices added Hospitalization /  Surgery History Type of Hospitalization/Surgery cervical fusion lumbar surgery coronary stent placement lasik eye surgery hydrocele excision Family and Social History Cancer: Yes - Mother; Diabetes: No; Heart Disease: Yes - Father,Mother; Hereditary Spherocytosis: No; Hypertension: Yes - Father,Siblings; Kidney Disease: No; Lung Disease: Yes - Siblings; Seizures: No; Stroke: No; Thyroid Problems: No; Tuberculosis: No; Former smoker - quit 1991; Marital Status - Married; Alcohol Use: Moderate; Drug Use: No History; Caffeine Use: Daily - coffee; Financial Concerns: No; Food, Clothing or Shelter Needs: No; Support System Lacking: No; Transportation Concerns: No Engineer, maintenance) Signed: 03/10/2022 2:43:32 PM By: Fredirick Maudlin MD FACS Signed: 03/10/2022 5:58:34 PM By: Baruch Gouty RN, BSN Entered By: Fredirick Maudlin on 03/10/2022 14:39:39 -------------------------------------------------------------------------------- [place] Details Patient Name: Date of Service: [name], [name] 03/10/2022 Medical Record Number: [id number] Patient Account Number: [id number] Date of Birth/Sex: Treating RN: [birth year] (71 y.o. [name] Primary Care  Provider: PA [name], [place] Other Clinician: Referring Provider: Treating Provider/Extender: [name] in Treatment: 4 Diagnosis Coding ICD-10 Codes Code Description [phone] 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d L97.522 Non-pressure chronic ulcer of other part of left foot with fat layer exposed 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) I10 Essential (primary) hypertension I25.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 I89.0 Lymphedema, not elsewhere classified Z86.718 Personal 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 Facility Procedures CPT4 Code: [id number] Description: [place] - DEB SUBQ TISSUE 20 SQ CM/< ICD-10 Diagnosis Description 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 Modifier: ed Quantity: 1 CPT4 Code: [id number] Description: [place] - DEBRIDE WOUND 1ST 20 SQ CM OR < ICD-10 Diagnosis Description L97.522 Non-pressure chronic ulcer of other part of left foot with fat layer exposed L97.512 Non-pressure chronic ulcer of other part of right foot with fat layer exposed Modifier: Quantity: 1 Physician Procedures : CPT4 Code Description Modifier [id number] - WC PHYS LEVEL 4 - EST PT 25 ICD-10 Diagnosis Description 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d L97.522 Non-pressure chronic ulcer of other part of left  foot with fat layer exposed L97.512 Non-pressure chronic ulcer of other part of right foot with fat layer exposed I89.0 Lymphedema, not elsewhere classified Quantity: 1 : [id number] - WC PHYS SUBQ TISS 20 SQ CM ICD-10 Diagnosis Description 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d Quantity: 1 : [id number] - WC PHYS DEBR WO  ANESTH 20 SQ CM ICD-10 Diagnosis Description L97.522 Non-pressure chronic ulcer of other part of left foot with fat layer exposed L97.512 Non-pressure chronic ulcer of other part of right foot with fat layer exposed Quantity: 1 Electronic Signature(s) Signed: 03/10/2022 2:42:45 PM By: Fredirick Maudlin MD FACS Entered By: Fredirick Maudlin on 03/10/2022 14:42:44

## 2022-03-11 NOTE — Progress Notes (Signed)
[name], [name] ([id number]) Visit Report for 03/10/2022 Arrival Information Details Patient Name: Date of Service: [name], [name] 03/10/2022 1:15 PM Medical Record Number: [id number] Patient Account Number: [id number] Date of Birth/Sex: Treating RN: [birth year] (71 y.o. [name] Primary Care Roey Coopman: PA [name], [place] Other Clinician: Referring Kharter Sestak: Treating Anniemae Haberkorn/Extender: [name] in Treatment: 4 Visit Information History Since Last Visit Added or deleted any medications: No Patient Arrived: Wheel Chair Any new allergies or adverse reactions: No Arrival Time: 13:19 Had a fall or experienced change in No Accompanied By: self activities of daily living that may affect Transfer Assistance: None risk of falls: Patient Identification Verified: Yes Signs or symptoms of abuse/neglect since last visito No Secondary Verification Process Completed: Yes Hospitalized since last visit: No Patient Requires Transmission-Based Precautions: No Implantable device outside of the clinic excluding No Patient Has Alerts: Yes cellular tissue based products placed in the center Patient Alerts: R ABI= N/C, TBI= .75 since last visit: L ABI =N/C, TBI = .57 Has Dressing in Place as Prescribed: Yes Has Compression in Place as Prescribed: Yes Pain Present Now: No Electronic Signature(s) Signed: 03/10/2022 5:58:34 PM By: Baruch Gouty RN, BSN Entered By: Baruch Gouty on 03/10/2022 13:21:08 -------------------------------------------------------------------------------- Compression Therapy Details Patient Name: Date of Service: [name], [name] 03/10/2022 1:15 PM Medical Record Number: [id number] Patient Account Number: [id number] Date of Birth/Sex: Treating RN: July 28, 1950 (71 y.o. [name] Primary Care Illona Bulman: PA [name], [place] Other Clinician: Referring Chesky Heyer: Treating Alysa Duca/Extender: [name] in Treatment: 4 Compression  Therapy Performed for Wound Assessment: Wound #1 Left,Dorsal Foot Performed By: Clinician Baruch Gouty, RN Compression Type: Three Layer Post Procedure Diagnosis Same as Pre-procedure Electronic Signature(s) Signed: 03/10/2022 5:58:34 PM By: Baruch Gouty RN, BSN Entered By: Baruch Gouty on 03/10/2022 13:53:56 -------------------------------------------------------------------------------- Compression Therapy Details Patient Name: Date of Service: [name], [name] 03/10/2022 1:15 PM Medical Record Number: [id number] Patient Account Number: [id number] Date of Birth/Sex: Treating RN: May 07, 1951 (71 y.o. [name] Primary Care Abhay Godbolt: PA [name], [place] Other Clinician: Referring Curvin Hunger: Treating Payton Moder/Extender: [name] in Treatment: 4 Compression Therapy Performed for Wound Assessment: Wound #3 Right,Medial Lower Leg Performed By: Clinician Baruch Gouty, RN Compression Type: Three Layer Post Procedure Diagnosis Same as Pre-procedure Electronic Signature(s) Signed: 03/10/2022 5:58:34 PM By: Baruch Gouty RN, BSN Entered By: Baruch Gouty on 03/10/2022 13:53:56 -------------------------------------------------------------------------------- Encounter Discharge Information Details Patient Name: Date of Service: [name], [name] 03/10/2022 1:15 PM Medical Record Number: [id number] Patient Account Number: [id number] Date of Birth/Sex: Treating RN: 16-Dec-1950 (71 y.o. [name] Primary Care Lula Kolton: PA [name], [place] Other Clinician: Referring Marveline Profeta: Treating Wynell Halberg/Extender: [name] in Treatment: 4 Encounter Discharge Information Items Post Procedure Vitals Discharge Condition: Stable Temperature (F): 97.8 Ambulatory Status: Wheelchair Pulse (bpm): 65 Discharge Destination: Home Respiratory Rate (breaths/min): 18 Transportation: Private Auto Blood Pressure (mmHg): 117/73 Accompanied By:  self Schedule Follow-up Appointment: Yes Clinical Summary of Care: Patient Declined Electronic Signature(s) Signed: 03/10/2022 5:58:34 PM By: Baruch Gouty RN, BSN Entered By: Baruch Gouty on 03/10/2022 14:22:00 -------------------------------------------------------------------------------- Lower Extremity Assessment Details Patient Name: Date of Service: [name], [name] 03/10/2022 1:15 PM Medical Record Number: [id number] Patient Account Number: [id number] Date of Birth/Sex: Treating RN: [birth year] (71 y.o. [name] Primary Care Raeanna Soberanes: PA [name], [place] Other Clinician: Referring Nayara Taplin: Treating Kaleel Schmieder/Extender: Valeria Batman Weeks in Treatment: 4 Edema Assessment Assessed: [Left: No] [Right: No] Edema: [Left: Yes] [Right: Yes] Calf Left: Right: Point of  Measurement: From Medial Instep 44 cm 47 cm Ankle Left: Right: Point of Measurement: From Medial Instep 25.5 cm 27 cm Vascular Assessment Pulses: Dorsalis Pedis Palpable: [Left:No] [Right:No] Electronic Signature(s) Signed: 03/10/2022 5:58:34 PM By: Baruch Gouty RN, BSN Entered By: Baruch Gouty on 03/10/2022 13:24:56 -------------------------------------------------------------------------------- Multi Wound Chart Details Patient Name: Date of Service: [name], [name] 03/10/2022 1:15 PM Medical Record Number: [id number] Patient Account Number: [id number] Date of Birth/Sex: Treating RN: [birth year] (71 y.o. [name] Primary Care Johnel Yielding: PA [name], [place] Other Clinician: Referring Aadam Zhen: Treating Ron Beske/Extender: [name] in Treatment: 4 Vital Signs Height(in): Pulse(bpm): 65 Weight(lbs): Blood Pressure(mmHg): 117/73 Body Mass Index(BMI): Temperature(F): 97.8 Respiratory Rate(breaths/min): 18 Photos: Left, Dorsal Foot Right, Lateral Foot Right, Medial Lower Leg Wound Location: Gradually Appeared Gradually Appeared Gradually  Appeared Wounding Event: Lymphedema Lymphedema Lymphedema Primary Etiology: Cataracts, Anemia, Lymphedema, Cataracts, Anemia, Lymphedema, Cataracts, Anemia, Lymphedema, Comorbid History: Congestive Heart Failure, Deep Vein Congestive Heart Failure, Deep Vein Congestive Heart Failure, Deep Vein Thrombosis, Hypertension, Peripheral Thrombosis, Hypertension, Peripheral Thrombosis, Hypertension, Peripheral Venous Disease Venous Disease Venous Disease 12/07/2021 11/02/2021 12/07/2021 Date Acquired: '4 4 4 '$ Weeks of Treatment: Open Open Open Wound Status: No No No Wound Recurrence: 2.4x2.4x0.5 2.4x1.1x0.1 2.7x4.2x1.1 Measurements L x W x D (cm) 4.524 2.073 8.906 A (cm) : rea 2.262 0.207 9.797 Volume (cm) : -19.30% 49.20% -66.70% % Reduction in Area: -49.10% 49.30% -358.70% % Reduction in Volume: Full Thickness Without Exposed Full Thickness Without Exposed Full Thickness With Exposed Support Classification: Support Structures Support Structures Structures Large Medium Medium Exudate A mount: Serosanguineous Serosanguineous Serosanguineous Exudate Type: red, brown red, brown red, brown Exudate Color: Well defined, not attached Flat and Intact Distinct, outline attached Wound Margin: Small (1-33%) None Present (0%) Small (1-33%) Granulation Amount: Red, Pink N/A Red Granulation Quality: Large (67-100%) Large (67-100%) Large (67-100%) Necrotic Amount: Adherent [place] Necrotic Tissue: Fat Layer (Subcutaneous Tissue): Yes Fat Layer (Subcutaneous Tissue): Yes Fat Layer (Subcutaneous Tissue): Yes Exposed Structures: Fascia: No Fascia: No Fascia: No Tendon: No Tendon: No Tendon: No Muscle: No Muscle: No Muscle: No Joint: No Joint: No Joint: No Bone: No Bone: No Bone: No None Small (1-33%) Small (1-33%) Epithelialization: Debridement - Excisional Debridement - Selective/Open Wound Debridement - Excisional Debridement: Pre-procedure  Verification/Time Out 13:40 13:40 13:40 Taken: Lidocaine 4% Topical Solution Lidocaine 4% Topical Solution Lidocaine 4% Topical Solution Pain Control: Subcutaneous, Psychologist, prison and probation services, [place] Other, Subcutaneous, [employer] Tissue Debrided: Skin/Subcutaneous Tissue Non-Viable Tissue Skin/Subcutaneous Tissue Level: 5.76 2.64 11.34 Debridement A (sq cm): rea Curette Curette Curette Instrument: Minimum Minimum Minimum Bleeding: Pressure Pressure Pressure Hemostasis A chieved: '2 2 2 '$ Procedural Pain: '1 1 1 '$ Post Procedural Pain: Procedure was tolerated well Procedure was tolerated well Procedure was tolerated well Debridement Treatment Response: 2.4x2.4x0.5 2.4x1.1x0.1 2.7x4.2x1.1 Post Debridement Measurements L x W x D (cm) 2.262 0.207 9.797 Post Debridement Volume: (cm) Compression Therapy Debridement Compression Therapy Procedures Performed: Debridement Debridement Wound Number: 4 5 N/A Photos: N/A Right, Medial Foot Right, Anterior Lower Leg N/A Wound Location: Gradually Appeared Gradually Appeared N/A Wounding Event: Lymphedema Abrasion N/A Primary Etiology: Cataracts, Anemia, Lymphedema, Cataracts, Anemia, Lymphedema, N/A Comorbid History: Congestive Heart Failure, Deep Vein Congestive Heart Failure, Deep Vein Thrombosis, Hypertension, Peripheral Thrombosis, Hypertension, Peripheral Venous Disease Venous Disease 02/17/2022 03/03/2022 N/A Date Acquired: 3 0 N/A Weeks of Treatment: Open Open N/A Wound Status: No No N/A Wound Recurrence: 0.1x0.1x0.1 0.3x0.7x0.1 N/A Measurements L x W x D (cm) 0.008 0.165 N/A A (cm) : rea  0.001 0.016 N/A Volume (cm) : 97.50% 4.60% N/A % Reduction in Area: 96.80% 5.90% N/A % Reduction in Volume: Full Thickness Without Exposed Full Thickness Without Exposed N/A Classification: Support Structures Support Structures Medium Medium N/A Exudate A mount: Serosanguineous Serosanguineous N/A Exudate Type: red, brown red, brown  N/A Exudate Color: Flat and Intact N/A N/A Wound Margin: None Present (0%) Large (67-100%) N/A Granulation Amount: N/A Red N/A Granulation Quality: Large (67-100%) None Present (0%) N/A Necrotic Amount: Eschar N/A N/A Necrotic Tissue: Fat Layer (Subcutaneous Tissue): Yes Fat Layer (Subcutaneous Tissue): Yes N/A Exposed Structures: Fascia: No Fascia: No Tendon: No Tendon: No Muscle: No Muscle: No Joint: No Joint: No Bone: No Bone: No Large (67-100%) Small (1-33%) N/A Epithelialization: N/A N/A N/A Debridement: N/A N/A N/A Pain Control: N/A N/A N/A Tissue Debrided: N/A N/A N/A Level: N/A N/A N/A Debridement A (sq cm): rea N/A N/A N/A Instrument: N/A N/A N/A Bleeding: N/A N/A N/A Hemostasis A chieved: N/A N/A N/A Procedural Pain: N/A N/A N/A Post Procedural Pain: N/A N/A N/A Debridement Treatment Response: N/A N/A N/A Post Debridement Measurements L x W x D (cm) N/A N/A N/A Post Debridement Volume: (cm) N/A N/A N/A Procedures Performed: Treatment Notes Wound #1 (Foot) Wound Laterality: Dorsal, Left Cleanser Peri-Wound Care Sween Lotion (Moisturizing lotion) Discharge Instruction: Apply moisturizing lotion as directed Topical Gentamicin Discharge Instruction: As directed by physician Primary Dressing KerraCel Ag Gelling Fiber Dressing, 4x5 in (silver alginate) Discharge Instruction: Apply silver alginate to wound bed as instructed Optifoam Non-Adhesive Dressing, 4x4 in Discharge Instruction: Apply to wound bed as instructed Secondary Dressing ABD Pad, 8x10 Discharge Instruction: Apply over primary dressing as directed. Woven Gauze Sponge, Non-Sterile 4x4 in Discharge Instruction: Apply over primary dressing as directed. Secured With Compression Wrap ThreePress (3 layer compression wrap) Discharge Instruction: Apply three layer compression as directed. Compression Stockings Add-Ons Wound #2 (Foot) Wound Laterality: Right,  Lateral Cleanser Peri-Wound Care Sween Lotion (Moisturizing lotion) Discharge Instruction: Apply moisturizing lotion as directed Topical Gentamicin Discharge Instruction: As directed by physician Primary Dressing KerraCel Ag Gelling Fiber Dressing, 4x5 in (silver alginate) Discharge Instruction: Apply silver alginate to wound bed as instructed Optifoam Non-Adhesive Dressing, 4x4 in Discharge Instruction: Apply to wound bed as instructed Secondary Dressing ABD Pad, 8x10 Discharge Instruction: Apply over primary dressing as directed. Woven Gauze Sponge, Non-Sterile 4x4 in Discharge Instruction: Apply over primary dressing as directed. Secured With Compression Wrap ThreePress (3 layer compression wrap) Discharge Instruction: Apply three layer compression as directed. Compression Stockings Add-Ons Wound #3 (Lower Leg) Wound Laterality: Right, Medial Cleanser Peri-Wound Care Sween Lotion (Moisturizing lotion) Discharge Instruction: Apply moisturizing lotion as directed Topical Gentamicin Discharge Instruction: As directed by physician Primary Dressing KerraCel Ag Gelling Fiber Dressing, 4x5 in (silver alginate) Discharge Instruction: Apply silver alginate to wound bed as instructed Optifoam Non-Adhesive Dressing, 4x4 in Discharge Instruction: Apply to wound bed as instructed Secondary Dressing ABD Pad, 8x10 Discharge Instruction: Apply over primary dressing as directed. Woven Gauze Sponge, Non-Sterile 4x4 in Discharge Instruction: Apply over primary dressing as directed. Secured With Compression Wrap ThreePress (3 layer compression wrap) Discharge Instruction: Apply three layer compression as directed. Compression Stockings Add-Ons Wound #4 (Foot) Wound Laterality: Right, Medial Cleanser Peri-Wound Care Sween Lotion (Moisturizing lotion) Discharge Instruction: Apply moisturizing lotion as directed Topical Gentamicin Discharge Instruction: As directed by  physician Primary Dressing KerraCel Ag Gelling Fiber Dressing, 4x5 in (silver alginate) Discharge Instruction: Apply silver alginate to wound bed as instructed Optifoam Non-Adhesive Dressing, 4x4 in Discharge Instruction: Apply to  wound bed as instructed Secondary Dressing ABD Pad, 8x10 Discharge Instruction: Apply over primary dressing as directed. Woven Gauze Sponge, Non-Sterile 4x4 in Discharge Instruction: Apply over primary dressing as directed. Secured With Compression Wrap ThreePress (3 layer compression wrap) Discharge Instruction: Apply three layer compression as directed. Compression Stockings Add-Ons Wound #5 (Lower Leg) Wound Laterality: Right, Anterior Cleanser Peri-Wound Care Sween Lotion (Moisturizing lotion) Discharge Instruction: Apply moisturizing lotion as directed Topical Gentamicin Discharge Instruction: As directed by physician Primary Dressing KerraCel Ag Gelling Fiber Dressing, 4x5 in (silver alginate) Discharge Instruction: Apply silver alginate to wound bed as instructed Optifoam Non-Adhesive Dressing, 4x4 in Discharge Instruction: Apply to wound bed as instructed Secondary Dressing ABD Pad, 8x10 Discharge Instruction: Apply over primary dressing as directed. Woven Gauze Sponge, Non-Sterile 4x4 in Discharge Instruction: Apply over primary dressing as directed. Secured With Compression Wrap ThreePress (3 layer compression wrap) Discharge Instruction: Apply three layer compression as directed. Compression Stockings Add-Ons Electronic Signature(s) Signed: 03/10/2022 2:37:02 PM By: Fredirick Maudlin MD FACS Signed: 03/10/2022 5:58:34 PM By: Baruch Gouty RN, BSN Entered By: Fredirick Maudlin on 03/10/2022 14:37:01 -------------------------------------------------------------------------------- Multi-Disciplinary Care Plan Details Patient Name: Date of Service: MICKIE, KOZIKOWSKI 03/10/2022 1:15 PM Medical Record Number: [id number] Patient Account  Number: [id number] Date of Birth/Sex: Treating RN: 1950/11/27 (71 y.o. [name] Primary Care Izek Corvino: PA [name], [place] Other Clinician: Referring Stehanie Ekstrom: Treating Maleak Brazzel/Extender: [name] in Treatment: 4 Multidisciplinary Care Plan reviewed with physician Active Inactive Venous Leg Ulcer Nursing Diagnoses: Knowledge deficit related to disease process and management Potential for venous Insuffiency (use before diagnosis confirmed) Goals: Patient will maintain optimal edema control Date Initiated: 02/17/2022 Target Resolution Date: 04/07/2022 Goal Status: Active Interventions: Assess peripheral edema status every visit. Compression as ordered Provide education on venous insufficiency Treatment Activities: Non-invasive vascular studies : 02/17/2022 Therapeutic compression applied : 02/17/2022 Notes: Wound/Skin Impairment Nursing Diagnoses: Impaired tissue integrity Knowledge deficit related to ulceration/compromised skin integrity Goals: Patient/caregiver will verbalize understanding of skin care regimen Date Initiated: 02/17/2022 Target Resolution Date: 04/07/2022 Goal Status: Active Ulcer/skin breakdown will have a volume reduction of 30% by week 4 Date Initiated: 02/17/2022 Date Inactivated: 03/10/2022 Target Resolution Date: 03/10/2022 Unmet Reason: infection being treated, Goal Status: Unmet waiting on derm appte Ulcer/skin breakdown will have a volume reduction of 50% by week 8 Date Initiated: 03/10/2022 Target Resolution Date: 04/07/2022 Goal Status: Active Interventions: Assess patient/caregiver ability to obtain necessary supplies Assess patient/caregiver ability to perform ulcer/skin care regimen upon admission and as needed Assess ulceration(s) every visit Provide education on ulcer and skin care Treatment Activities: Skin care regimen initiated : 02/17/2022 Topical wound management initiated : 02/17/2022 Notes: Electronic  Signature(s) Signed: 03/10/2022 5:58:34 PM By: Baruch Gouty RN, BSN Entered By: Baruch Gouty on 03/10/2022 13:32:18 -------------------------------------------------------------------------------- Pain Assessment Details Patient Name: Date of Service: SANTIAGO, STENZEL 03/10/2022 1:15 PM Medical Record Number: [id number] Patient Account Number: [id number] Date of Birth/Sex: Treating RN: [birth year] (71 y.o. [name] Primary Care Kacin Dancy: PA [name], [place] Other Clinician: Referring Taheerah Guldin: Treating Shateka Petrea/Extender: [name] in Treatment: 4 Active Problems Location of Pain Severity and Description of Pain Patient Has Paino No Site Locations Rate the pain. Rate the pain. Current Pain Level: 0 Character of Pain Describe the Pain: Tender Pain Management and Medication Current Pain Management: Electronic Signature(s) Signed: 03/10/2022 5:58:34 PM By: Baruch Gouty RN, BSN Entered By: Baruch Gouty on 03/10/2022 13:22:01 -------------------------------------------------------------------------------- Patient/Caregiver Education Details Patient Name: Date of Service: Pamalee Leyden, [name] 8/16/2023andnbsp1:15 PM  Medical Record Number: [id number] Patient Account Number: [id number] Date of Birth/Gender: Treating RN: 1950/08/04 (71 y.o. [name] Primary Care Physician: PA [name], [place] Other Clinician: Referring Physician: Treating Physician/Extender: [name] in Treatment: 4 Education Assessment Education Provided To: Patient Education Topics Provided Venous: Methods: Explain/Verbal Responses: Reinforcements needed, State content correctly Wound/Skin Impairment: Methods: Explain/Verbal Responses: Reinforcements needed, State content correctly Electronic Signature(s) Signed: 03/10/2022 5:58:34 PM By: Baruch Gouty RN, BSN Entered By: Baruch Gouty on 03/10/2022  13:33:06 -------------------------------------------------------------------------------- Wound Assessment Details Patient Name: Date of Service: ALIEU, FINNIGAN 03/10/2022 1:15 PM Medical Record Number: [id number] Patient Account Number: [id number] Date of Birth/Sex: Treating RN: 1950-11-16 (71 y.o. [name] [facility]: [name], [place] Other Clinician: Referring Ladeja Pelham: Treating Gordon Carlson/Extender: Valeria Batman Weeks in Treatment: 4 Wound Status Wound Number: 1 Primary Lymphedema Etiology: Wound Location: Left, Dorsal Foot Wound Open Wounding Event: Gradually Appeared Status: Date Acquired: 12/07/2021 Comorbid Cataracts, Anemia, Lymphedema, Congestive Heart Failure, Deep Weeks Of Treatment: 4 History: Vein Thrombosis, Hypertension, Peripheral Venous Disease Clustered Wound: No Photos Wound Measurements Length: (cm) 2.4 Width: (cm) 2.4 Depth: (cm) 0.5 Area: (cm) 4.524 Volume: (cm) 2.262 % Reduction in Area: -19.3% % Reduction in Volume: -49.1% Epithelialization: None Tunneling: No Undermining: No Wound Description Classification: Full Thickness Without Exposed Support Structures Wound Margin: Well defined, not attached Exudate Amount: Large Exudate Type: Serosanguineous Exudate Color: red, brown Foul Odor After Cleansing: No Slough/Fibrino Yes Wound Bed Granulation Amount: Small (1-33%) Exposed Structure Granulation Quality: Red, Pink Fascia Exposed: No Necrotic Amount: Large (67-100%) Fat Layer (Subcutaneous Tissue) Exposed: Yes Necrotic Quality: Adherent Slough Tendon Exposed: No Muscle Exposed: No Joint Exposed: No Bone Exposed: No Treatment Notes Wound #1 (Foot) Wound Laterality: Dorsal, Left Cleanser Peri-Wound Care Sween Lotion (Moisturizing lotion) Discharge Instruction: Apply moisturizing lotion as directed Topical Gentamicin Discharge Instruction: As directed by physician Primary Dressing KerraCel Ag Gelling  Fiber Dressing, 4x5 in (silver alginate) Discharge Instruction: Apply silver alginate to wound bed as instructed Optifoam Non-Adhesive Dressing, 4x4 in Discharge Instruction: Apply to wound bed as instructed Secondary Dressing ABD Pad, 8x10 Discharge Instruction: Apply over primary dressing as directed. Woven Gauze Sponge, Non-Sterile 4x4 in Discharge Instruction: Apply over primary dressing as directed. Secured With Compression Wrap ThreePress (3 layer compression wrap) Discharge Instruction: Apply three layer compression as directed. Compression Stockings Add-Ons Electronic Signature(s) Signed: 03/10/2022 4:40:50 PM By: Sharyn Creamer RN, BSN Entered By: Sharyn Creamer on 03/10/2022 13:26:58 -------------------------------------------------------------------------------- Wound Assessment Details Patient Name: Date of Service: [name], PETRICH 03/10/2022 1:15 PM Medical Record Number: [id number] Patient Account Number: [id number] Date of Birth/Sex: Treating RN: 1951/06/29 (71 y.o. [name] Primary Care Farron Watrous: PA [name], [place] Other Clinician: Referring Chisum Habenicht: Treating Herbert Aguinaldo/Extender: Valeria Batman Weeks in Treatment: 4 Wound Status Wound Number: 2 Primary Lymphedema Etiology: Wound Location: Right, Lateral Foot Wound Open Wounding Event: Gradually Appeared Status: Date Acquired: 11/02/2021 Comorbid Cataracts, Anemia, Lymphedema, Congestive Heart Failure, Deep Weeks Of Treatment: 4 History: Vein Thrombosis, Hypertension, Peripheral Venous Disease Clustered Wound: No Photos Wound Measurements Length: (cm) 2.4 Width: (cm) 1.1 Depth: (cm) 0.1 Area: (cm) 2.073 Volume: (cm) 0.207 % Reduction in Area: 49.2% % Reduction in Volume: 49.3% Epithelialization: Small (1-33%) Tunneling: No Undermining: No Wound Description Classification: Full Thickness Without Exposed Support Structures Wound Margin: Flat and Intact Exudate Amount:  Medium Exudate Type: Serosanguineous Exudate Color: red, brown Wound Bed Granulation Amount: None Present (0%) Necrotic Amount: Large (67-100%) Necrotic Quality: Adherent Slough Foul Odor After  Cleansing: No Slough/Fibrino Yes Exposed Structure Fascia Exposed: No Fat Layer (Subcutaneous Tissue) Exposed: Yes Tendon Exposed: No Muscle Exposed: No Joint Exposed: No Bone Exposed: No Treatment Notes Wound #2 (Foot) Wound Laterality: Right, Lateral Cleanser Peri-Wound Care Sween Lotion (Moisturizing lotion) Discharge Instruction: Apply moisturizing lotion as directed Topical Gentamicin Discharge Instruction: As directed by physician Primary Dressing KerraCel Ag Gelling Fiber Dressing, 4x5 in (silver alginate) Discharge Instruction: Apply silver alginate to wound bed as instructed Optifoam Non-Adhesive Dressing, 4x4 in Discharge Instruction: Apply to wound bed as instructed Secondary Dressing ABD Pad, 8x10 Discharge Instruction: Apply over primary dressing as directed. Woven Gauze Sponge, Non-Sterile 4x4 in Discharge Instruction: Apply over primary dressing as directed. Secured With Compression Wrap ThreePress (3 layer compression wrap) Discharge Instruction: Apply three layer compression as directed. Compression Stockings Add-Ons Electronic Signature(s) Signed: 03/10/2022 4:40:50 PM By: Sharyn Creamer RN, BSN Entered By: Sharyn Creamer on 03/10/2022 13:27:41 -------------------------------------------------------------------------------- Wound Assessment Details Patient Name: Date of Service: DAYYAN, KRIST 03/10/2022 1:15 PM Medical Record Number: [id number] Patient Account Number: [id number] Date of Birth/Sex: Treating RN: August 26, 1950 (71 y.o. [name] Primary Care Kalilah Barua: PA [name], NO Other Clinician: Referring Symphany Fleissner: Treating Sheliah Fiorillo/Extender: Valeria Batman Weeks in Treatment: 4 Wound Status Wound Number: 3 Primary  Lymphedema Etiology: Wound Location: Right, Medial Lower Leg Wound Open Wounding Event: Gradually Appeared Status: Date Acquired: 12/07/2021 Comorbid Cataracts, Anemia, Lymphedema, Congestive Heart Failure, Deep Weeks Of Treatment: 4 History: Vein Thrombosis, Hypertension, Peripheral Venous Disease Clustered Wound: No Photos Wound Measurements Length: (cm) 2.7 Width: (cm) 4.2 Depth: (cm) 1.1 Area: (cm) 8.906 Volume: (cm) 9.797 % Reduction in Area: -66.7% % Reduction in Volume: -358.7% Epithelialization: Small (1-33%) Tunneling: No Undermining: No Wound Description Classification: Full Thickness With Exposed Support Structures Wound Margin: Distinct, outline attached Exudate Amount: Medium Exudate Type: Serosanguineous Exudate Color: red, brown Foul Odor After Cleansing: No Slough/Fibrino Yes Wound Bed Granulation Amount: Small (1-33%) Exposed Structure Granulation Quality: Red Fascia Exposed: No Necrotic Amount: Large (67-100%) Fat Layer (Subcutaneous Tissue) Exposed: Yes Necrotic Quality: Adherent Slough Tendon Exposed: No Muscle Exposed: No Joint Exposed: No Bone Exposed: No Treatment Notes Wound #3 (Lower Leg) Wound Laterality: Right, Medial Cleanser Peri-Wound Care Sween Lotion (Moisturizing lotion) Discharge Instruction: Apply moisturizing lotion as directed Topical Gentamicin Discharge Instruction: As directed by physician Primary Dressing KerraCel Ag Gelling Fiber Dressing, 4x5 in (silver alginate) Discharge Instruction: Apply silver alginate to wound bed as instructed Optifoam Non-Adhesive Dressing, 4x4 in Discharge Instruction: Apply to wound bed as instructed Secondary Dressing ABD Pad, 8x10 Discharge Instruction: Apply over primary dressing as directed. Woven Gauze Sponge, Non-Sterile 4x4 in Discharge Instruction: Apply over primary dressing as directed. Secured With Compression Wrap ThreePress (3 layer compression wrap) Discharge  Instruction: Apply three layer compression as directed. Compression Stockings Add-Ons Electronic Signature(s) Signed: 03/10/2022 4:40:50 PM By: Sharyn Creamer RN, BSN Entered By: Sharyn Creamer on 03/10/2022 13:29:20 -------------------------------------------------------------------------------- Wound Assessment Details Patient Name: Date of Service: MELQUIADES, KOVAR 03/10/2022 1:15 PM Medical Record Number: [id number] Patient Account Number: [id number] Date of Birth/Sex: Treating RN: [birth year] (71 y.o. [name] Primary Care Sevin Farone: PA [name], NO Other Clinician: Referring Jeffory Snelgrove: Treating Gio Janoski/Extender: Valeria Batman Weeks in Treatment: 4 Wound Status Wound Number: 4 Primary Lymphedema Etiology: Wound Location: Right, Medial Foot Wound Open Wounding Event: Gradually Appeared Status: Date Acquired: 02/17/2022 Comorbid Cataracts, Anemia, Lymphedema, Congestive Heart Failure, Deep Weeks Of Treatment: 3 History: Vein Thrombosis, Hypertension, Peripheral Venous Disease Clustered Wound: No Photos Wound Measurements Length: (cm)  0.1 Width: (cm) 0.1 Depth: (cm) 0.1 Area: (cm) 0.008 Volume: (cm) 0.001 % Reduction in Area: 97.5% % Reduction in Volume: 96.8% Epithelialization: Large (67-100%) Tunneling: No Undermining: No Wound Description Classification: Full Thickness Without Exposed Support Structures Wound Margin: Flat and Intact Exudate Amount: Medium Exudate Type: Serosanguineous Exudate Color: red, brown Foul Odor After Cleansing: No Slough/Fibrino Yes Wound Bed Granulation Amount: None Present (0%) Exposed Structure Necrotic Amount: Large (67-100%) Fascia Exposed: No Necrotic Quality: Eschar Fat Layer (Subcutaneous Tissue) Exposed: Yes Tendon Exposed: No Muscle Exposed: No Joint Exposed: No Bone Exposed: No Treatment Notes Wound #4 (Foot) Wound Laterality: Right, Medial Cleanser Peri-Wound Care Sween Lotion (Moisturizing  lotion) Discharge Instruction: Apply moisturizing lotion as directed Topical Gentamicin Discharge Instruction: As directed by physician Primary Dressing KerraCel Ag Gelling Fiber Dressing, 4x5 in (silver alginate) Discharge Instruction: Apply silver alginate to wound bed as instructed Optifoam Non-Adhesive Dressing, 4x4 in Discharge Instruction: Apply to wound bed as instructed Secondary Dressing ABD Pad, 8x10 Discharge Instruction: Apply over primary dressing as directed. Woven Gauze Sponge, Non-Sterile 4x4 in Discharge Instruction: Apply over primary dressing as directed. Secured With Compression Wrap ThreePress (3 layer compression wrap) Discharge Instruction: Apply three layer compression as directed. Compression Stockings Add-Ons Electronic Signature(s) Signed: 03/10/2022 4:40:50 PM By: Sharyn Creamer RN, BSN Entered By: Sharyn Creamer on 03/10/2022 13:30:12 -------------------------------------------------------------------------------- Wound Assessment Details Patient Name: Date of Service: RAMZY, CAPPELLETTI 03/10/2022 1:15 PM Medical Record Number: [id number] Patient Account Number: [id number] Date of Birth/Sex: Treating RN: [birth year] (71 y.o. [name] Primary Care Gianno Volner: PA [name], NO Other Clinician: Referring Kharizma Lesnick: Treating Darean Rote/Extender: Valeria Batman Weeks in Treatment: 4 Wound Status Wound Number: 5 Primary Abrasion Etiology: Wound Location: Right, Anterior Lower Leg Wound Open Wounding Event: Gradually Appeared Status: Date Acquired: 03/03/2022 Comorbid Cataracts, Anemia, Lymphedema, Congestive Heart Failure, Deep Weeks Of Treatment: 0 History: Vein Thrombosis, Hypertension, Peripheral Venous Disease Clustered Wound: No Photos Wound Measurements Length: (cm) 0.3 Width: (cm) 0.7 Depth: (cm) 0.1 Area: (cm) 0.165 Volume: (cm) 0.016 % Reduction in Area: 4.6% % Reduction in Volume: 5.9% Epithelialization: Small  (1-33%) Tunneling: No Undermining: No Wound Description Classification: Full Thickness Without Exposed Support Structures Exudate Amount: Medium Exudate Type: Serosanguineous Exudate Color: red, brown Foul Odor After Cleansing: No Slough/Fibrino Yes Wound Bed Granulation Amount: Large (67-100%) Exposed Structure Granulation Quality: Red Fascia Exposed: No Necrotic Amount: None Present (0%) Fat Layer (Subcutaneous Tissue) Exposed: Yes Tendon Exposed: No Muscle Exposed: No Joint Exposed: No Bone Exposed: No Treatment Notes Wound #5 (Lower Leg) Wound Laterality: Right, Anterior Cleanser Peri-Wound Care Sween Lotion (Moisturizing lotion) Discharge Instruction: Apply moisturizing lotion as directed Topical Gentamicin Discharge Instruction: As directed by physician Primary Dressing KerraCel Ag Gelling Fiber Dressing, 4x5 in (silver alginate) Discharge Instruction: Apply silver alginate to wound bed as instructed Optifoam Non-Adhesive Dressing, 4x4 in Discharge Instruction: Apply to wound bed as instructed Secondary Dressing ABD Pad, 8x10 Discharge Instruction: Apply over primary dressing as directed. Woven Gauze Sponge, Non-Sterile 4x4 in Discharge Instruction: Apply over primary dressing as directed. Secured With Compression Wrap ThreePress (3 layer compression wrap) Discharge Instruction: Apply three layer compression as directed. Compression Stockings Add-Ons Electronic Signature(s) Signed: 03/10/2022 4:40:50 PM By: Sharyn Creamer RN, BSN Entered By: Sharyn Creamer on 03/10/2022 13:30:57 -------------------------------------------------------------------------------- Force Details Patient Name: Date of Service: ORLIE, CUNDARI 03/10/2022 1:15 PM Medical Record Number: [id number] Patient Account Number: [id number] Date of Birth/Sex: Treating RN: 08-03-1950 (71 y.o. [name] Primary Care Kimoni Pickerill: PA [name], NO Other Clinician:  Referring Tove Wideman: Treating  Nimra Puccinelli/Extender: Valeria Batman Weeks in Treatment: 4 Vital Signs Time Taken: 13:21 Temperature (F): 97.8 Pulse (bpm): 65 Respiratory Rate (breaths/min): 18 Blood Pressure (mmHg): 117/73 Reference Range: 80 - 120 mg / dl Electronic Signature(s) Signed: 03/10/2022 5:58:34 PM By: Baruch Gouty RN, BSN Entered By: Baruch Gouty on 03/10/2022 13:21:47

## 2022-03-12 ENCOUNTER — Inpatient Hospital Stay: Payer: Medicare Other

## 2022-03-12 ENCOUNTER — Other Ambulatory Visit: Payer: Self-pay

## 2022-03-12 DIAGNOSIS — Z86718 Personal history of other venous thrombosis and embolism: Secondary | ICD-10-CM | POA: Diagnosis not present

## 2022-03-12 DIAGNOSIS — Z7901 Long term (current) use of anticoagulants: Secondary | ICD-10-CM | POA: Diagnosis not present

## 2022-03-12 DIAGNOSIS — D696 Thrombocytopenia, unspecified: Secondary | ICD-10-CM

## 2022-03-12 DIAGNOSIS — Z114 Encounter for screening for human immunodeficiency virus [HIV]: Secondary | ICD-10-CM

## 2022-03-12 DIAGNOSIS — D539 Nutritional anemia, unspecified: Secondary | ICD-10-CM

## 2022-03-12 DIAGNOSIS — Z7289 Other problems related to lifestyle: Secondary | ICD-10-CM

## 2022-03-12 LAB — CMP (CANCER CENTER ONLY)
ALT: 13 U/L (ref 0–44)
AST: 18 U/L (ref 15–41)
Albumin: 3.7 g/dL (ref 3.5–5.0)
Alkaline Phosphatase: 45 U/L (ref 38–126)
Anion gap: 3 — ABNORMAL LOW (ref 5–15)
BUN: 37 mg/dL — ABNORMAL HIGH (ref 8–23)
CO2: 31 mmol/L (ref 22–32)
Calcium: 9.2 mg/dL (ref 8.9–10.3)
Chloride: 104 mmol/L (ref 98–111)
Creatinine: 1.07 mg/dL (ref 0.61–1.24)
GFR, Estimated: >60 mL/min (ref >60)
Glucose, Bld: 87 mg/dL (ref 70–99)
Potassium: 4.8 mmol/L (ref 3.5–5.1)
Sodium: 138 mmol/L (ref 135–145)
Total Bilirubin: 0.5 mg/dL (ref 0.3–1.2)
Total Protein: 6.4 g/dL — ABNORMAL LOW (ref 6.5–8.1)

## 2022-03-12 LAB — CBC WITH DIFFERENTIAL (CANCER CENTER ONLY)
Abs Immature Granulocytes: 0.03 10*3/uL (ref 0.00–0.07)
Basophils Absolute: 0.1 10*3/uL (ref 0.0–0.1)
Basophils Relative: 1 %
Eosinophils Absolute: 0.3 10*3/uL (ref 0.0–0.5)
Eosinophils Relative: 6 %
HCT: 37.5 % — ABNORMAL LOW (ref 39.0–52.0)
Hemoglobin: 12.5 g/dL — ABNORMAL LOW (ref 13.0–17.0)
Immature Granulocytes: 1 %
Lymphocytes Relative: 21 %
Lymphs Abs: 1.2 10*3/uL (ref 0.7–4.0)
MCH: 33.5 pg (ref 26.0–34.0)
MCHC: 33.3 g/dL (ref 30.0–36.0)
MCV: 100.5 fL — ABNORMAL HIGH (ref 80.0–100.0)
Monocytes Absolute: 0.6 10*3/uL (ref 0.1–1.0)
Monocytes Relative: 11 %
Neutro Abs: 3.6 10*3/uL (ref 1.7–7.7)
Neutrophils Relative %: 60 %
Platelet Count: 115 10*3/uL — ABNORMAL LOW (ref 150–400)
RBC: 3.73 MIL/uL — ABNORMAL LOW (ref 4.22–5.81)
RDW: 14 % (ref 11.5–15.5)
WBC Count: 5.8 10*3/uL (ref 4.0–10.5)
nRBC: 0 % (ref 0.0–0.2)

## 2022-03-12 LAB — VITAMIN B12: Vitamin B-12: 315 pg/mL (ref 180–914)

## 2022-03-12 LAB — HIV ANTIBODY (ROUTINE TESTING W REFLEX): HIV Screen 4th Generation wRfx: NONREACTIVE

## 2022-03-12 LAB — FOLATE: Folate: 38.2 ng/mL (ref >5.9)

## 2022-03-12 LAB — SAVE SMEAR(SSMR), FOR PROVIDER SLIDE REVIEW

## 2022-03-12 LAB — HEPATITIS B SURFACE ANTIGEN: Hepatitis B Surface Ag: NONREACTIVE

## 2022-03-12 LAB — HEPATITIS B CORE ANTIBODY, TOTAL: Hep B Core Total Ab: NONREACTIVE

## 2022-03-12 LAB — HEPATITIS C ANTIBODY: HCV Ab: NONREACTIVE

## 2022-03-12 LAB — HEPATITIS B SURFACE ANTIBODY,QUALITATIVE: Hep B S Ab: NONREACTIVE

## 2022-03-15 LAB — METHYLMALONIC ACID, SERUM: Methylmalonic Acid, Quantitative: 248 nmol/L (ref 0–378)

## 2022-03-18 ENCOUNTER — Telehealth: Payer: Self-pay

## 2022-03-18 ENCOUNTER — Other Ambulatory Visit: Payer: Self-pay | Admitting: Physician Assistant

## 2022-03-18 DIAGNOSIS — D696 Thrombocytopenia, unspecified: Secondary | ICD-10-CM

## 2022-03-18 NOTE — Telephone Encounter (Signed)
Spoke with pt and pt's wife regarding lab results.  Abdominal US has been scheduled at 9:30 on 8/31 at [place] understanding and agreed to plan

## 2022-03-18 NOTE — Telephone Encounter (Signed)
-----   Message from Lincoln Brigham, PA-C sent at 03/18/2022 12:10 PM EDT ----- Please notify patient that lab workup for low platelets was negative. Recommend abdominal US to evaluate for liver disease and splenomegaly. Please schedule and notify patient

## 2022-03-22 ENCOUNTER — Encounter (HOSPITAL_BASED_OUTPATIENT_CLINIC_OR_DEPARTMENT_OTHER): Payer: Medicare Other | Admitting: General Surgery

## 2022-03-22 DIAGNOSIS — L97812 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d: Secondary | ICD-10-CM | POA: Diagnosis not present

## 2022-03-22 DIAGNOSIS — L97522 Non-pressure chronic ulcer of other part of left foot with fat layer exposed: Secondary | ICD-10-CM | POA: Diagnosis not present

## 2022-03-22 DIAGNOSIS — I110 Hypertensive heart disease with heart failure: Secondary | ICD-10-CM | POA: Diagnosis not present

## 2022-03-22 DIAGNOSIS — L97512 Non-pressure chronic ulcer of other part of right foot with fat layer exposed: Secondary | ICD-10-CM | POA: Diagnosis not present

## 2022-03-22 DIAGNOSIS — I890 Lymphedema, not elsewhere classified: Secondary | ICD-10-CM | POA: Diagnosis not present

## 2022-03-22 DIAGNOSIS — I872 Venous insufficiency (chronic) (peripheral): Secondary | ICD-10-CM | POA: Diagnosis not present

## 2022-03-22 DIAGNOSIS — I251 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: Secondary | ICD-10-CM | POA: Diagnosis not present

## 2022-03-23 NOTE — Progress Notes (Signed)
[name], [name] ([id number]) Visit Report for 03/22/2022 Arrival Information Details Patient Name: Date of Service: [name], [name] 03/22/2022 2:00 PM Medical Record Number: [id number] Patient Account Number: [id number] Date of Birth/Sex: Treating RN: 1951-02-14 (71 y.o. [name] Primary Care Que Meneely: PA [name], [place] Other Clinician: Referring [name]: Treating Marni Franzoni/Extender: [name] in Treatment: 5 Visit Information History Since Last Visit Added or deleted any medications: No Patient Arrived: Wheel Chair Any new allergies or adverse reactions: No Arrival Time: 14:10 Had a fall or experienced change in No Accompanied By: self activities of daily living that may affect Transfer Assistance: None risk of falls: Patient Identification Verified: Yes Signs or symptoms of abuse/neglect since last visito No Secondary Verification Process Completed: Yes Hospitalized since last visit: No Patient Requires Transmission-Based Precautions: No Implantable device outside of the clinic excluding No Patient Has Alerts: Yes cellular tissue based products placed in the center Patient Alerts: R ABI= N/C, TBI= .75 since last visit: L ABI =N/C, TBI = .57 Has Dressing in Place as Prescribed: Yes Has Compression in Place as Prescribed: Yes Pain Present Now: No Electronic Signature(s) Signed: 03/23/2022 6:08:33 PM By: Baruch Gouty RN, BSN Entered By: Baruch Gouty on 03/22/2022 14:21:33 -------------------------------------------------------------------------------- Compression Therapy Details Patient Name: Date of Service: [name] 03/22/2022 2:00 PM Medical Record Number: [id number] Patient Account Number: [id number] Date of Birth/Sex: Treating RN: Apr 08, 1951 (71 y.o. [name] Primary Care Sequoia Witz: PA [name], [place] Other Clinician: Referring Teesha Ohm: Treating Kizzi Overbey/Extender: [name] in Treatment: 5 Compression  Therapy Performed for Wound Assessment: Wound #1 Left,Dorsal Foot Performed By: Clinician Baruch Gouty, RN Compression Type: Three Layer Post Procedure Diagnosis Same as Pre-procedure Electronic Signature(s) Signed: 03/23/2022 6:08:33 PM By: Baruch Gouty RN, BSN Entered By: Baruch Gouty on 03/22/2022 15:01:39 -------------------------------------------------------------------------------- Compression Therapy Details Patient Name: Date of Service: [name] 03/22/2022 2:00 PM Medical Record Number: [id number] Patient Account Number: [id number] Date of Birth/Sex: Treating RN: [birth year] (71 y.o. [name] Primary Care Coolidge Gossard: PA [name], [place] Other Clinician: Referring Betzabeth Derringer: Treating Farron Lafond/Extender: [name] in Treatment: 5 Compression Therapy Performed for Wound Assessment: Wound #3 Right,Medial Lower Leg Performed By: Clinician Baruch Gouty, RN Compression Type: Three Layer Post Procedure Diagnosis Same as Pre-procedure Electronic Signature(s) Signed: 03/23/2022 6:08:33 PM By: Baruch Gouty RN, BSN Entered By: Baruch Gouty on 03/22/2022 15:01:39 -------------------------------------------------------------------------------- Encounter Discharge Information Details Patient Name: Date of Service: [name] 03/22/2022 2:00 PM Medical Record Number: [id number] Patient Account Number: [id number] Date of Birth/Sex: Treating RN: 1950-12-16 (71 y.o. [name] Primary Care Wyona Neils: PA [name], [place] Other Clinician: Referring Shelby Anderle: Treating Kimberlye Dilger/Extender: [name] in Treatment: 5 Encounter Discharge Information Items Post Procedure Vitals Discharge Condition: Stable Temperature (F): 98.6 Ambulatory Status: Ambulatory Pulse (bpm): 60 Discharge Destination: Home Respiratory Rate (breaths/min): 18 Transportation: Private Auto Blood Pressure (mmHg): 159/72 Accompanied By:  self Schedule Follow-up Appointment: Yes Clinical Summary of Care: Patient Declined Electronic Signature(s) Signed: 03/23/2022 6:08:33 PM By: Baruch Gouty RN, BSN Entered By: Baruch Gouty on 03/22/2022 15:34:03 -------------------------------------------------------------------------------- Lower Extremity Assessment Details Patient Name: Date of Service: [name], [name] 03/22/2022 2:00 PM Medical Record Number: [id number] Patient Account Number: [id number] Date of Birth/Sex: Treating RN: [birth year] (71 y.o. [name] Primary Care Tajae Rybicki: PA [name], [place] Other Clinician: Referring Lamir Racca: Treating Kayler Buckholtz/Extender: Valeria Batman Weeks in Treatment: 5 Edema Assessment Assessed: [Left: No] [Right: No] Edema: [Left: Yes] [Right: Yes] Calf Left: Right: Point of  Measurement: From Medial Instep 44 cm 46.5 cm Ankle Left: Right: Point of Measurement: From Medial Instep 27 cm 26.5 cm Vascular Assessment Pulses: Dorsalis Pedis Palpable: [Left:Yes] [Right:Yes] Electronic Signature(s) Signed: 03/23/2022 6:08:33 PM By: Baruch Gouty RN, BSN Entered By: Baruch Gouty on 03/22/2022 14:29:25 -------------------------------------------------------------------------------- Multi Wound Chart Details Patient Name: Date of Service: [name] 03/22/2022 2:00 PM Medical Record Number: [id number] Patient Account Number: [id number] Date of Birth/Sex: Treating RN: [birth year] (71 y.o. [name] Primary Care Kahlani Graber: PA [name], [place] Other Clinician: Referring Oziel Beitler: Treating Toryn Mcclinton/Extender: [name] in Treatment: 5 Vital Signs Height(in): Pulse(bpm): 60 Weight(lbs): Blood Pressure(mmHg): 159/72 Body Mass Index(BMI): Temperature(F): 98.6 Respiratory Rate(breaths/min): 18 Photos: Left, Dorsal Foot Right, Lateral Foot Right, Medial Lower Leg Wound Location: Gradually Appeared Gradually Appeared Gradually  Appeared Wounding Event: Lymphedema Lymphedema Lymphedema Primary Etiology: Cataracts, Anemia, Lymphedema, Cataracts, Anemia, Lymphedema, Cataracts, Anemia, Lymphedema, Comorbid History: Congestive Heart Failure, Deep Vein Congestive Heart Failure, Deep Vein Congestive Heart Failure, Deep Vein Thrombosis, Hypertension, Peripheral Thrombosis, Hypertension, Peripheral Thrombosis, Hypertension, Peripheral Venous Disease Venous Disease Venous Disease 12/07/2021 11/02/2021 12/07/2021 Date Acquired: '5 5 5 '$ Weeks of Treatment: Open Open Open Wound Status: No No No Wound Recurrence: 2.3x2.2x0.5 0.9x0.7x0.1 2.8x4x0.8 Measurements L x W x D (cm) 3.974 0.495 8.796 A (cm) : rea 1.987 0.049 7.037 Volume (cm) : -4.80% 87.90% -64.70% % Reduction in Area: -31.00% 88.00% -229.40% % Reduction in Volume: Full Thickness Without Exposed Full Thickness Without Exposed Full Thickness With Exposed Support Classification: Support Structures Support Structures Structures Large Small Large Exudate Amount: Serosanguineous Serosanguineous Purulent Exudate Type: red, brown red, brown yellow, brown, green Exudate Color: No No Yes Foul Odor A Cleansing: fter N/A N/A No Odor Anticipated Due to Product Use: Well defined, not attached Flat and Intact Distinct, outline attached Wound Margin: Medium (34-66%) None Present (0%) Small (1-33%) Granulation A mount: Red, Pink N/A Red Granulation Quality: Medium (34-66%) Large (67-100%) Large (67-100%) Necrotic Amount: Fat Layer (Subcutaneous Tissue): Yes Fat Layer (Subcutaneous Tissue): Yes Fat Layer (Subcutaneous Tissue): Yes Exposed Structures: Fascia: No Fascia: No Fascia: No Tendon: No Tendon: No Tendon: No Muscle: No Muscle: No Muscle: No Joint: No Joint: No Joint: No Bone: No Bone: No Bone: No Small (1-33%) Medium (34-66%) Small (1-33%) Epithelialization: Debridement - Excisional Debridement - Selective/Open Wound Debridement -  Excisional Debridement: Pre-procedure Verification/Time Out 15:00 15:00 15:00 Taken: Lidocaine 4% Topical Solution Lidocaine 4% Topical Solution Lidocaine 4% Topical Solution Pain Control: Subcutaneous, [employer] , Subcutaneous, Slough Tissue Debrided: Skin/Subcutaneous Tissue Non-Viable Tissue Skin/Subcutaneous Tissue Level: 5.06 0.63 11.2 Debridement A (sq cm): rea Curette Curette Curette Instrument: Minimum Minimum Minimum Bleeding: Pressure Pressure Pressure Hemostasis A chieved: '4 4 4 '$ Procedural Pain: '2 2 2 '$ Post Procedural Pain: Procedure was tolerated well Procedure was tolerated well Procedure was tolerated well Debridement Treatment Response: 2.3x2.2x0.5 0.9x0.7x0.1 2.8x4x0.8 Post Debridement Measurements L x W x D (cm) 1.987 0.049 7.037 Post Debridement Volume: (cm) N/A N/A calcium deposits exposed Assessment Notes: Compression Therapy Debridement Compression Therapy Procedures Performed: Debridement Debridement Wound Number: 4 5 N/A Photos: N/A Right, Medial Foot Right, Anterior Lower Leg N/A Wound Location: Gradually Appeared Gradually Appeared N/A Wounding Event: Lymphedema Abrasion N/A Primary Etiology: Cataracts, Anemia, Lymphedema, Cataracts, Anemia, Lymphedema, N/A Comorbid History: Congestive Heart Failure, Deep Vein Congestive Heart Failure, Deep Vein Thrombosis, Hypertension, Peripheral Thrombosis, Hypertension, Peripheral Venous Disease Venous Disease 02/17/2022 03/03/2022 N/A Date Acquired: 4 2 N/A Weeks of Treatment: Open Open N/A Wound Status: No No N/A Wound Recurrence:  0x0x0 0x0x0 N/A Measurements L x W x D (cm) 0 0 N/A A (cm) : rea 0 0 N/A Volume (cm) : 100.00% 100.00% N/A % Reduction in Area: 100.00% 100.00% N/A % Reduction in Volume: Full Thickness Without Exposed Full Thickness Without Exposed N/A Classification: Support Structures Support Structures None Present None Present N/A Exudate Amount: N/A N/A  N/A Exudate Type: N/A N/A N/A Exudate Color: No No N/A Foul Odor A Cleansing: fter N/A N/A N/A Odor Anticipated Due to Product Use: N/A N/A N/A Wound Margin: None Present (0%) None Present (0%) N/A Granulation A mount: N/A N/A N/A Granulation Quality: None Present (0%) None Present (0%) N/A Necrotic Amount: Fascia: No Fascia: No N/A Exposed Structures: Fat Layer (Subcutaneous Tissue): No Fat Layer (Subcutaneous Tissue): No Tendon: No Tendon: No Muscle: No Muscle: No Joint: No Joint: No Bone: No Bone: No Large (67-100%) Large (67-100%) N/A Epithelialization: N/A N/A N/A Debridement: N/A N/A N/A Pain Control: N/A N/A N/A Tissue Debrided: N/A N/A N/A Level: N/A N/A N/A Debridement A (sq cm): rea N/A N/A N/A Instrument: N/A N/A N/A Bleeding: N/A N/A N/A Hemostasis Achieved: N/A N/A N/A Procedural Pain: N/A N/A N/A Post Procedural Pain: N/A N/A N/A Debridement Treatment Response: N/A N/A N/A Post Debridement Measurements L x W x D (cm) N/A N/A N/A Post Debridement Volume: (cm) N/A N/A N/A Assessment Notes: N/A N/A N/A Procedures Performed: Treatment Notes Electronic Signature(s) Signed: 03/22/2022 3:11:46 PM By: Fredirick Maudlin MD FACS Signed: 03/23/2022 6:08:33 PM By: Baruch Gouty RN, BSN Entered By: Fredirick Maudlin on 03/22/2022 15:11:46 -------------------------------------------------------------------------------- Multi-Disciplinary Care Plan Details Patient Name: Date of Service: [name] 03/22/2022 2:00 PM Medical Record Number: [id number] Patient Account Number: [id number] Date of Birth/Sex: Treating RN: Feb 25, 1951 (71 y.o. [name] Primary Care Usiel Astarita: PA [name], [place] Other Clinician: Referring Amauri Keefe: Treating Oakley Kossman/Extender: [name] in Treatment: 5 Multidisciplinary Care Plan reviewed with physician Active Inactive Venous Leg Ulcer Nursing Diagnoses: Knowledge deficit related  to disease process and management Potential for venous Insuffiency (use before diagnosis confirmed) Goals: Patient will maintain optimal edema control Date Initiated: 02/17/2022 Target Resolution Date: 04/07/2022 Goal Status: Active Interventions: Assess peripheral edema status every visit. Compression as ordered Provide education on venous insufficiency Treatment Activities: Non-invasive vascular studies : 02/17/2022 Therapeutic compression applied : 02/17/2022 Notes: Wound/Skin Impairment Nursing Diagnoses: Impaired tissue integrity Knowledge deficit related to ulceration/compromised skin integrity Goals: Patient/caregiver will verbalize understanding of skin care regimen Date Initiated: 02/17/2022 Target Resolution Date: 04/07/2022 Goal Status: Active Ulcer/skin breakdown will have a volume reduction of 30% by week 4 Date Initiated: 02/17/2022 Date Inactivated: 03/10/2022 Target Resolution Date: 03/10/2022 Unmet Reason: infection being treated, Goal Status: Unmet waiting on derm appte Ulcer/skin breakdown will have a volume reduction of 50% by week 8 Date Initiated: 03/10/2022 Target Resolution Date: 04/07/2022 Goal Status: Active Interventions: Assess patient/caregiver ability to obtain necessary supplies Assess patient/caregiver ability to perform ulcer/skin care regimen upon admission and as needed Assess ulceration(s) every visit Provide education on ulcer and skin care Treatment Activities: Skin care regimen initiated : 02/17/2022 Topical wound management initiated : 02/17/2022 Notes: Electronic Signature(s) Signed: 03/23/2022 6:08:33 PM By: Baruch Gouty RN, BSN Entered By: Baruch Gouty on 03/22/2022 14:46:03 -------------------------------------------------------------------------------- Pain Assessment Details Patient Name: Date of Service: [name] 03/22/2022 2:00 PM Medical Record Number: [id number] Patient Account Number: [id number] Date of  Birth/Sex: Treating RN: 10-Feb-1951 (71 y.o. [name] Primary Care Kennett Symes: [name], [place] Other Clinician: Referring Marija Calamari: Treating Jammal Sarr/Extender: [name],  Jewel Baize, [name] in Treatment: 5 Active Problems Location of Pain Severity and Description of Pain Patient Has Paino No Site Locations Rate the pain. Current Pain Level: 0 Pain Management and Medication Current Pain Management: Electronic Signature(s) Signed: 03/23/2022 6:08:33 PM By: Baruch Gouty RN, BSN Entered By: Baruch Gouty on 03/22/2022 14:22:06 -------------------------------------------------------------------------------- Patient/Caregiver Education Details Patient Name: Date of Service: Slivinski, [name] 8/28/2023andnbsp2:00 PM Medical Record Number: [id number] Patient Account Number: [id number] Date of Birth/Gender: Treating RN: [birth year] (71 y.o. [name] Primary Care Physician: PA [name], [place] Other Clinician: Referring Physician: Treating Physician/Extender: [name] in Treatment: 5 Education Assessment Education Provided To: Patient Education Topics Provided Venous: Methods: Explain/Verbal Responses: Reinforcements needed, State content correctly Wound/Skin Impairment: Methods: Explain/Verbal Responses: Reinforcements needed, State content correctly Electronic Signature(s) Signed: 03/23/2022 6:08:33 PM By: Baruch Gouty RN, BSN Entered By: Baruch Gouty on 03/22/2022 14:46:22 -------------------------------------------------------------------------------- Wound Assessment Details Patient Name: Date of Service: [name] 03/22/2022 2:00 PM Medical Record Number: [id number] Patient Account Number: [id number] Date of Birth/Sex: Treating RN: 01-Dec-1950 (71 y.o. [name] Primary Care Cydne Grahn: PA [name], [place] Other Clinician: Referring Floy Riegler: Treating Nakhia Levitan/Extender: [name] in Treatment:  5 Wound Status Wound Number: 1 Primary Lymphedema Etiology: Wound Location: Left, Dorsal Foot Wound Open Wounding Event: Gradually Appeared Status: Date Acquired: 12/07/2021 Comorbid Cataracts, Anemia, Lymphedema, Congestive Heart Failure, Deep Weeks Of Treatment: 5 History: Vein Thrombosis, Hypertension, Peripheral Venous Disease Clustered Wound: No Photos Wound Measurements Length: (cm) 2.3 Width: (cm) 2.2 Depth: (cm) 0.5 Area: (cm) 3.974 Volume: (cm) 1.987 % Reduction in Area: -4.8% % Reduction in Volume: -31% Epithelialization: Small (1-33%) Tunneling: No Undermining: No Wound Description Classification: Full Thickness Without Exposed Support Structures Wound Margin: Well defined, not attached Exudate Amount: Large Exudate Type: Serosanguineous Exudate Color: red, brown Foul Odor After Cleansing: No Slough/Fibrino Yes Wound Bed Granulation Amount: Medium (34-66%) Exposed Structure Granulation Quality: Red, Pink Fascia Exposed: No Necrotic Amount: Medium (34-66%) Fat Layer (Subcutaneous Tissue) Exposed: Yes Necrotic Quality: Adherent Slough Tendon Exposed: No Muscle Exposed: No Joint Exposed: No Bone Exposed: No Treatment Notes Wound #1 (Foot) Wound Laterality: Dorsal, Left Cleanser Peri-Wound Care Triamcinolone 15 (g) Discharge Instruction: Use triamcinolone 15 (g) as directed Zinc Oxide Ointment 30g tube Discharge Instruction: Apply Zinc Oxide to periwound with each dressing change Sween Lotion (Moisturizing lotion) Discharge Instruction: Apply moisturizing lotion as directed Topical Gentamicin Discharge Instruction: As directed by physician Primary Dressing KerraCel Ag Gelling Fiber Dressing, 4x5 in (silver alginate) Discharge Instruction: Apply silver alginate to wound bed as instructed Secondary Dressing Woven Gauze Sponge, Non-Sterile 4x4 in Discharge Instruction: Apply over primary dressing as directed. Zetuvit Plus 4x8 in Discharge  Instruction: Apply over primary dressing as directed. Secured With Compression Wrap ThreePress (3 layer compression wrap) Discharge Instruction: Apply three layer compression as directed. Compression Stockings Add-Ons Electronic Signature(s) Signed: 03/23/2022 6:08:33 PM By: Baruch Gouty RN, BSN Entered By: Baruch Gouty on 03/22/2022 14:52:29 -------------------------------------------------------------------------------- Wound Assessment Details Patient Name: Date of Service: [name] 03/22/2022 2:00 PM Medical Record Number: [id number] Patient Account Number: [id number] Date of Birth/Sex: Treating RN: [birth year] (71 y.o. [name] Primary Care Floy Angert: PA [name], [place] Other Clinician: Referring Labrina Lines: Treating Saanvika Vazques/Extender: Valeria Batman Weeks in Treatment: 5 Wound Status Wound Number: 2 Primary Lymphedema Etiology: Wound Location: Right, Lateral Foot Wound Open Wounding Event: Gradually Appeared Status: Date Acquired: 11/02/2021 Comorbid Cataracts, Anemia, Lymphedema, Congestive Heart Failure, Deep Weeks Of Treatment: 5 History: Vein Thrombosis,  Hypertension, Peripheral Venous Disease Clustered Wound: No Photos Wound Measurements Length: (cm) 0.9 Width: (cm) 0.7 Depth: (cm) 0.1 Area: (cm) 0.495 Volume: (cm) 0.049 % Reduction in Area: 87.9% % Reduction in Volume: 88% Epithelialization: Medium (34-66%) Tunneling: No Undermining: No Wound Description Classification: Full Thickness Without Exposed Support Structures Wound Margin: Flat and Intact Exudate Amount: Small Exudate Type: Serosanguineous Exudate Color: red, brown Foul Odor After Cleansing: No Slough/Fibrino Yes Wound Bed Granulation Amount: None Present (0%) Exposed Structure Necrotic Amount: Large (67-100%) Fascia Exposed: No Necrotic Quality: Adherent Slough Fat Layer (Subcutaneous Tissue) Exposed: Yes Tendon Exposed: No Muscle Exposed: No Joint Exposed:  No Bone Exposed: No Treatment Notes Wound #2 (Foot) Wound Laterality: Right, Lateral Cleanser Peri-Wound Care Sween Lotion (Moisturizing lotion) Discharge Instruction: Apply moisturizing lotion as directed Topical Gentamicin Discharge Instruction: As directed by physician Primary Dressing KerraCel Ag Gelling Fiber Dressing, 4x5 in (silver alginate) Discharge Instruction: Apply silver alginate to wound bed as instructed Secondary Dressing Woven Gauze Sponge, Non-Sterile 4x4 in Discharge Instruction: Apply over primary dressing as directed. Secured With Compression Wrap ThreePress (3 layer compression wrap) Discharge Instruction: Apply three layer compression as directed. Compression Stockings Add-Ons Electronic Signature(s) Signed: 03/23/2022 6:08:33 PM By: Baruch Gouty RN, BSN Entered By: Baruch Gouty on 03/22/2022 14:52:08 -------------------------------------------------------------------------------- Wound Assessment Details Patient Name: Date of Service: [name] 03/22/2022 2:00 PM Medical Record Number: [id number] Patient Account Number: [id number] Date of Birth/Sex: Treating RN: 1950-10-28 (71 y.o. [name] Primary Care Floyd Lusignan: PA [name], [place] Other Clinician: Referring Chea Malan: Treating Anne Boltz/Extender: [name] in Treatment: 5 Wound Status Wound Number: 3 Primary Lymphedema Etiology: Wound Location: Right, Medial Lower Leg Wound Open Wounding Event: Gradually Appeared Status: Date Acquired: 12/07/2021 Comorbid Cataracts, Anemia, Lymphedema, Congestive Heart Failure, Deep Weeks Of Treatment: 5 History: Vein Thrombosis, Hypertension, Peripheral Venous Disease Clustered Wound: No Photos Wound Measurements Length: (cm) 2.8 Width: (cm) 4 Depth: (cm) 0.8 Area: (cm) 8.796 Volume: (cm) 7.037 % Reduction in Area: -64.7% % Reduction in Volume: -229.4% Epithelialization: Small (1-33%) Tunneling:  No Undermining: No Wound Description Classification: Full Thickness With Exposed Support Structures Wound Margin: Distinct, outline attached Exudate Amount: Large Exudate Type: Purulent Exudate Color: yellow, brown, green Foul Odor After Cleansing: Yes Due to Product Use: No Slough/Fibrino Yes Wound Bed Granulation Amount: Small (1-33%) Exposed Structure Granulation Quality: Red Fascia Exposed: No Necrotic Amount: Large (67-100%) Fat Layer (Subcutaneous Tissue) Exposed: Yes Necrotic Quality: Adherent Slough Tendon Exposed: No Muscle Exposed: No Joint Exposed: No Bone Exposed: No Assessment Notes calcium deposits exposed Treatment Notes Wound #3 (Lower Leg) Wound Laterality: Right, Medial Cleanser Peri-Wound Care Ketoconazole Cream 2% Discharge Instruction: Apply Ketoconazole to periwoiund rash Triamcinolone 15 (g) Discharge Instruction: Use triamcinolone 15 (g) as directed Sween Lotion (Moisturizing lotion) Discharge Instruction: Apply moisturizing lotion as directed Topical Gentamicin Discharge Instruction: As directed by physician Primary Dressing KerraCel Ag Gelling Fiber Dressing, 4x5 in (silver alginate) Discharge Instruction: Apply silver alginate to wound bed as instructed Secondary Dressing ABD Pad, 8x10 Discharge Instruction: Apply over primary dressing as directed. Woven Gauze Sponge, Non-Sterile 4x4 in Discharge Instruction: Apply over primary dressing as directed. Secured With Compression Wrap ThreePress (3 layer compression wrap) Discharge Instruction: Apply three layer compression as directed. Compression Stockings Add-Ons Electronic Signature(s) Signed: 03/23/2022 6:08:33 PM By: Baruch Gouty RN, BSN Entered By: Baruch Gouty on 03/22/2022 14:50:48 -------------------------------------------------------------------------------- Wound Assessment Details Patient Name: Date of Service: [name] 03/22/2022 2:00 PM Medical Record Number:  [id number] Patient Account Number: [id number] Date  of Birth/Sex: Treating RN: July 21, 1951 (70 y.o. [name] Primary Care Lezly Rumpf: PA [name], [place] Other Clinician: Referring Taquan Bralley: Treating Carianne Taira/Extender: [name] in Treatment: 5 Wound Status Wound Number: 4 Primary Lymphedema Etiology: Wound Location: Right, Medial Foot Wound Open Wounding Event: Gradually Appeared Status: Date Acquired: 02/17/2022 Comorbid Cataracts, Anemia, Lymphedema, Congestive Heart Failure, Deep Weeks Of Treatment: 4 History: Vein Thrombosis, Hypertension, Peripheral Venous Disease Clustered Wound: No Photos Wound Measurements Length: (cm) Width: (cm) Depth: (cm) Area: (cm) Volume: (cm) 0 % Reduction in Area: 100% 0 % Reduction in Volume: 100% 0 Epithelialization: Large (67-100%) 0 Tunneling: No 0 Undermining: No Wound Description Classification: Full Thickness Without Exposed Support Structures Exudate Amount: None Present Foul Odor After Cleansing: No Slough/Fibrino No Wound Bed Granulation Amount: None Present (0%) Exposed Structure Necrotic Amount: None Present (0%) Fascia Exposed: No Fat Layer (Subcutaneous Tissue) Exposed: No Tendon Exposed: No Muscle Exposed: No Joint Exposed: No Bone Exposed: No Electronic Signature(s) Signed: 03/23/2022 6:08:33 PM By: Baruch Gouty RN, BSN Entered By: Baruch Gouty on 03/22/2022 14:51:20 -------------------------------------------------------------------------------- Wound Assessment Details Patient Name: Date of Service: [name] 03/22/2022 2:00 PM Medical Record Number: [id number] Patient Account Number: [id number] Date of Birth/Sex: Treating RN: [birth year] (71 y.o. [name] Primary Care Docie Abramovich: [name], [place] Other Clinician: Referring Anel Purohit: Treating Seth Higginbotham/Extender: [name] in Treatment: 5 Wound Status Wound Number: 5 Primary  Abrasion Etiology: Wound Location: Right, Anterior Lower Leg Wound Open Wounding Event: Gradually Appeared Status: Date Acquired: 03/03/2022 Comorbid Cataracts, Anemia, Lymphedema, Congestive Heart Failure, Deep Weeks Of Treatment: 2 History: Vein Thrombosis, Hypertension, Peripheral Venous Disease Clustered Wound: No Photos Wound Measurements Length: (cm) Width: (cm) Depth: (cm) Area: (cm) Volume: (cm) 0 % Reduction in Area: 100% 0 % Reduction in Volume: 100% 0 Epithelialization: Large (67-100%) 0 Tunneling: No 0 Undermining: No Wound Description Classification: Full Thickness Without Exposed Support Structures Exudate Amount: None Present Foul Odor After Cleansing: No Slough/Fibrino No Wound Bed Granulation Amount: None Present (0%) Exposed Structure Necrotic Amount: None Present (0%) Fascia Exposed: No Fat Layer (Subcutaneous Tissue) Exposed: No Tendon Exposed: No Muscle Exposed: No Joint Exposed: No Bone Exposed: No Electronic Signature(s) Signed: 03/23/2022 6:08:33 PM By: Baruch Gouty RN, BSN Entered By: Baruch Gouty on 03/22/2022 14:51:46 -------------------------------------------------------------------------------- [place]ie Details Patient Name: Date of Service: [name] 03/22/2022 2:00 PM Medical Record Number: [id number] Patient Account Number: [id number] Date of Birth/Sex: Treating RN: Nov 05, 1950 (71 y.o. [name] Primary Care Jamica Woodyard: PA [name], [place] Other Clinician: Referring Kyro Joswick: Treating Shelvy Perazzo/Extender: [name] in Treatment: 5 Vital Signs Time Taken: 14:21 Temperature (F): 98.6 Pulse (bpm): 60 Respiratory Rate (breaths/min): 18 Blood Pressure (mmHg): 159/72 Reference Range: 80 - 120 mg / dl Electronic Signature(s) Signed: 03/23/2022 6:08:33 PM By: Baruch Gouty RN, BSN Entered By: Baruch Gouty on 03/22/2022 14:22:00

## 2022-03-23 NOTE — Progress Notes (Signed)
[name], [name] ([id number]) Visit Report for 03/22/2022 Chief Complaint Document Details Patient Name: Date of Service: [name], [name] 03/22/2022 2:00 PM Medical Record Number: [id number] Patient Account Number: [id number] Date of Birth/Sex: Treating RN: May 03, 1951 (71 y.o. [name] Primary Care Provider: PA [name], [place] Other Clinician: Referring Provider: Treating Provider/Extender: [name] in Treatment: 5 Information Obtained from: Patient Chief Complaint Patient seen for complaints of Non-Healing Wounds. Electronic Signature(s) Signed: 03/22/2022 3:11:56 PM By: Fredirick Maudlin MD FACS Entered By: Fredirick Maudlin on 03/22/2022 15:11:56 -------------------------------------------------------------------------------- Debridement Details Patient Name: Date of Service: [name] 03/22/2022 2:00 PM Medical Record Number: [id number] Patient Account Number: [id number] Date of Birth/Sex: Treating RN: 11/30/1950 (71 y.o. [name] Primary Care Provider: PA [name], [place] Other Clinician: Referring Provider: Treating Provider/Extender: [name] in Treatment: 5 Debridement Performed for Assessment: Wound #3 Right,Medial Lower Leg Performed By: Physician Fredirick Maudlin, MD Debridement Type: Debridement Level of Consciousness (Pre-procedure): Awake and Alert Pre-procedure Verification/Time Out Yes - 15:00 Taken: Start Time: 15:00 Pain Control: Lidocaine 4% T opical Solution T Area Debrided (L x W): otal 2.8 (cm) x 4 (cm) = 11.2 (cm) Tissue and other material debrided: Viable, Non-Viable, Slough, Subcutaneous, Slough, Other: calcium deposits Level: Skin/Subcutaneous Tissue Debridement Description: Excisional Instrument: Curette Specimen: Tissue Culture Number of Specimens T aken: 1 Bleeding: Minimum Hemostasis Achieved: Pressure Procedural Pain: 4 Post Procedural Pain: 2 Response to Treatment: Procedure was  tolerated well Level of Consciousness (Post- Awake and Alert procedure): Post Debridement Measurements of Total Wound Length: (cm) 2.8 Width: (cm) 4 Depth: (cm) 0.8 Volume: (cm) 7.037 Character of Wound/Ulcer Post Debridement: Requires Further Debridement Post Procedure Diagnosis Same as Pre-procedure Electronic Signature(s) Signed: 03/22/2022 3:44:00 PM By: Fredirick Maudlin MD FACS Signed: 03/23/2022 6:08:33 PM By: Baruch Gouty RN, BSN Entered By: Baruch Gouty on 03/22/2022 15:02:55 -------------------------------------------------------------------------------- Debridement Details Patient Name: Date of Service: [name] 03/22/2022 2:00 PM Medical Record Number: [id number] Patient Account Number: [id number] Date of Birth/Sex: Treating RN: May 21, 1951 (71 y.o. [name] Primary Care Provider: PA [name], [place] Other Clinician: Referring Provider: Treating Provider/Extender: [name] in Treatment: 5 Debridement Performed for Assessment: Wound #2 Right,Lateral Foot Performed By: Physician Fredirick Maudlin, MD Debridement Type: Debridement Level of Consciousness (Pre-procedure): Awake and Alert Pre-procedure Verification/Time Out Yes - 15:00 Taken: Start Time: 15:00 Pain Control: Lidocaine 4% T opical Solution T Area Debrided (L x W): otal 0.9 (cm) x 0.7 (cm) = 0.63 (cm) Tissue and other material debrided: Non-Viable, Slough, Slough Level: Non-Viable Tissue Debridement Description: Selective/Open Wound Instrument: Curette Bleeding: Minimum Hemostasis Achieved: Pressure Procedural Pain: 4 Post Procedural Pain: 2 Response to Treatment: Procedure was tolerated well Level of Consciousness (Post- Awake and Alert procedure): Post Debridement Measurements of Total Wound Length: (cm) 0.9 Width: (cm) 0.7 Depth: (cm) 0.1 Volume: (cm) 0.049 Character of Wound/Ulcer Post Debridement: Improved Post Procedure Diagnosis Same as  Pre-procedure Electronic Signature(s) Signed: 03/22/2022 3:44:00 PM By: Fredirick Maudlin MD FACS Signed: 03/23/2022 6:08:33 PM By: Baruch Gouty RN, BSN Entered By: Baruch Gouty on 03/22/2022 15:03:32 -------------------------------------------------------------------------------- Debridement Details Patient Name: Date of Service: [name] 03/22/2022 2:00 PM Medical Record Number: [id number] Patient Account Number: [id number] Date of Birth/Sex: Treating RN: [birth year] (71 y.o. [name] Primary Care Provider: PA [name], [place] Other Clinician: Referring Provider: Treating Provider/Extender: [name] in Treatment: 5 Debridement Performed for Assessment: Wound #1 Left,Dorsal Foot Performed By: Physician Fredirick Maudlin, MD Debridement Type: Debridement  Level of Consciousness (Pre-procedure): Awake and Alert Pre-procedure Verification/Time Out Yes - 15:00 Taken: Start Time: 15:00 Pain Control: Lidocaine 4% T opical Solution T Area Debrided (L x W): otal 2.3 (cm) x 2.2 (cm) = 5.06 (cm) Tissue and other material debrided: Viable, Non-Viable, Slough, Subcutaneous, Slough Level: Skin/Subcutaneous Tissue Debridement Description: Excisional Instrument: Curette Bleeding: Minimum Hemostasis Achieved: Pressure Procedural Pain: 4 Post Procedural Pain: 2 Response to Treatment: Procedure was tolerated well Level of Consciousness (Post- Awake and Alert procedure): Post Debridement Measurements of Total Wound Length: (cm) 2.3 Width: (cm) 2.2 Depth: (cm) 0.5 Volume: (cm) 1.987 Character of Wound/Ulcer Post Debridement: Requires Further Debridement Post Procedure Diagnosis Same as Pre-procedure Electronic Signature(s) Signed: 03/22/2022 3:44:00 PM By: Fredirick Maudlin MD FACS Signed: 03/23/2022 6:08:33 PM By: Baruch Gouty RN, BSN Entered By: Baruch Gouty on 03/22/2022  15:08:01 -------------------------------------------------------------------------------- HPI Details Patient Name: Date of Service: [name] 03/22/2022 2:00 PM Medical Record Number: [id number] Patient Account Number: [id number] Date of Birth/Sex: Treating RN: Aug 06, 1950 (70 y.o. [name] Primary Care Provider: PA [name], [place] Other Clinician: Referring Provider: Treating Provider/Extender: [name] in Treatment: 5 History of Present Illness HPI Description: ADMISSION 02/09/2022 This is a 71 year old man who recently moved to [place] from [place]. He has a history of of coronary artery disease and prior left popliteal vein DVT . He is not diabetic and does not smoke. 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 He has had multiple spinal surgeries and has limited mobility. He primarily uses a motorized wheelchair. He has had lymphedema for many years and does have lymphedema pumps although he says he does not use them frequently. He has wounds on his bilateral lower extremities. He was receiving care in [place] for these prior to his move. They were apparently packing his wounds with Betadine soaked gauze. He has worn compression wraps in the past but not recently. He did say that they helped tremendously. In clinic today, his right ABI is noncompressible but has good signals; the left ABI was 1.17. No formal vascular studies have been performed. On his left dorsal foot, there is a circular lesion that exposes the fat layer. There is fairly heavy slough accumulation within the wound, but no significant odor. On his right medial calf, he has multiple pinholes that have slough buildup in them and probe for about 2 to 4 mm in depth. They are draining clear fluid. On his right lateral midfoot, he has ulceration consistent with venous stasis. It is geographic and has slough accumulation. 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 02/17/2022: All of the wounds are little bit larger today and have more slough accumulation. Today, the medial calf openings are larger and probe to something that feels calcified. There is odor coming from his wounds. His vascular studies are scheduled for August 9 and 10. 02-24-2022 upon evaluation today patient presents for follow-up concerning his ongoing issues here with his lower extremities. With that being said he does have significant problems with what appears to be cellulitis unfortunately the PCR culture that was sent last week got crushed by [employer] in transit. With that being said we are going to reobtain a culture today although I am actually to do it on the right medial leg as this seems to be the most inflamed area today where there is some questionable drainage as well. I am going to remove  some of the calcium deposits which are loosening obtain a deeper culture from this area. Fortunately I do not see any evidence of active infection systemically which is good news but locally I think we are having a bigger issue here. He does have his appointment next Thursday with vascular. Patient has also been referred to [facility] for further evaluation and treatment of the calcinosis for possible sulfate injections. 03/04/2022: The culture that was performed last week grew out multiple species including Klebsiella, Morganella, and Pseudomonas aeruginosa. He had been prescribed Bactrim but this was not adequate to cover all of the species and levofloxacin was recommended. He completed the Bactrim but did not pick up the levofloxacin and begin taking it until yesterday. We referred him to dermatology at 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 He had arterial studies performed today. He does  have evidence of peripheral arterial disease. Results copied here: +-------+-----------+-----------+------------+------------+ ABI/TBIT oday's ABIT oday's TBIPrevious ABIPrevious TBI +-------+-----------+-----------+------------+------------+ Right [place] 0.75    +-------+-----------+-----------+------------+------------+ Left [place] 0.57    +-------+-----------+-----------+------------+------------+ Arterial wall calcification precludes accurate ankle pressures and ABIs. Summary: Right: Resting right ankle-brachial index indicates noncompressible right lower extremity arteries. The right toe-brachial index is normal. Left: Resting left ankle-brachial index indicates noncompressible left lower extremity arteries. The left toe-brachial index is abnormal. He continues to have further tissue breakdown at the right medial calf and there is ample slough accumulation along with necrotic subcutaneous tissue. The left dorsal foot wound has built up slough, as well. The right medial foot wound is nearly closed with just a light layer of eschar over the surface. The right dorsal lateral foot wound has also accumulated a fair amount of slough and remains quite tender. 03/10/2022: He has been taking the prescribed levofloxacin and has about 3 days left of this. The erythema and induration on his right leg has improved. He continues to experience further breakdown of the right medial calf wound. There is extensive slough and debris accumulation there. There is heavy slough on his left dorsal foot wound, but no odor or purulent drainage. The right medial foot wound appears to be closed. The right dorsal lateral foot wound also has a fair amount of slough but it is less tender than at our last visit. He still does not have an appointment with dermatology. 03/22/2022: The right anterior tibial wound has closed. The right lateral foot wound is nearly closed with just a little bit of slough. The left  dorsal foot wound is smaller and continues to have some slough buildup but less so than at prior visits. There is good granulation tissue underlying the slough. Unfortunately the right medial calf wound continues to deteriorate. It has a foul odor today and a lot of necrotic tissue, the surrounding skin is also erythematous and moist. Electronic Signature(s) Signed: 03/22/2022 3:13:39 PM By: Fredirick Maudlin MD FACS Entered By: Fredirick Maudlin on 03/22/2022 15:13:39 -------------------------------------------------------------------------------- Physical Exam Details Patient Name: Date of Service: [name] 03/22/2022 2:00 PM Medical Record Number: [id number] Patient Account Number: [id number] Date of Birth/Sex: Treating RN: March 10, 1951 (71 y.o. [name] Primary Care Provider: PA [name], [place] Other Clinician: Referring Provider: Treating Provider/Extender: [name] in Treatment: 5 Constitutional Hypertensive, asymptomatic. . . . No acute distress.Marland Kitchen Respiratory Normal work of breathing on room air.. Notes 03/22/2022: The right anterior tibial wound has closed. The right lateral foot wound is  nearly closed with just a little bit of slough. The left dorsal foot wound is smaller and continues to have some slough buildup but less so than at prior visits. There is good granulation tissue underlying the slough. Unfortunately the right medial calf wound continues to deteriorate. It has a foul odor today and a lot of necrotic tissue, the surrounding skin is also erythematous and moist. Electronic Signature(s) Signed: 03/22/2022 3:14:07 PM By: Fredirick Maudlin MD FACS Entered By: Fredirick Maudlin on 03/22/2022 15:14:07 -------------------------------------------------------------------------------- Physician Orders Details Patient Name: Date of Service: [name] 03/22/2022 2:00 PM Medical Record Number: [id number] Patient Account Number: [id number] Date of  Birth/Sex: Treating RN: 09-23-1950 (71 y.o. [name] Primary Care Provider: PA [name], [place] Other Clinician: Referring Provider: Treating Provider/Extender: [name] in Treatment: 5 Verbal / Phone Orders: No Diagnosis Coding ICD-10 Coding Code Description [phone] 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d L97.522 Non-pressure chronic ulcer of other part of left foot with fat layer exposed 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) I10 Essential (primary) hypertension I25.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 I89.0 Lymphedema, not elsewhere classified Z86.718 Personal 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 Follow-up Appointments ppointment in 1 week. - Dr. [name] Rm 1 Return A Tuesday 9/5 at 10:30 am Anesthetic Wound #1 Left,Dorsal Foot (In clinic) Topical Lidocaine 4% applied to wound bed Wound #2 Right,Lateral Foot (In clinic) Topical Lidocaine 4% applied to wound bed Wound #3 Right,Medial Lower Leg (In clinic) Topical Lidocaine 4% applied to wound bed Bathing/ Shower/ Hygiene May shower with protection but do not get wound dressing(s) wet. Edema Control - Lymphedema / SCD / Other Lymphedema Pumps. Use Lymphedema pumps on leg(s) 2-3 times a day for 45-60 minutes. If wearing any wraps or hose, do not remove them. Continue exercising as instructed. - use 1-2 times per day over wraps Elevate legs to the level of the heart or above for 30 minutes daily and/or when sitting, a frequency of: Exercise regularly Wound Treatment Wound #1 - Foot Wound Laterality: Dorsal, Left Peri-Wound Care: Triamcinolone 15 (g) 1 x Per Week Discharge Instructions: Use triamcinolone 15 (g) as directed Peri-Wound Care: Zinc Oxide Ointment 30g tube 1 x Per Week Discharge Instructions:  Apply Zinc Oxide to periwound with each dressing change Peri-Wound Care: Sween Lotion (Moisturizing lotion) 1 x Per Week Discharge Instructions: Apply moisturizing lotion as directed Topical: Gentamicin 1 x Per Week Discharge Instructions: As directed by physician Prim Dressing: KerraCel Ag Gelling Fiber Dressing, 4x5 in (silver alginate) 1 x Per Week ary Discharge Instructions: Apply silver alginate to wound bed as instructed Secondary Dressing: Woven Gauze Sponge, Non-Sterile 4x4 in 1 x Per Week Discharge Instructions: Apply over primary dressing as directed. Secondary Dressing: Zetuvit Plus 4x8 in 1 x Per Week Discharge Instructions: Apply over primary dressing as directed. Compression Wrap: ThreePress (3 layer compression wrap) 1 x Per Week Discharge Instructions: Apply three layer compression as directed. Wound #2 - Foot Wound Laterality: Right, Lateral Peri-Wound Care: Sween Lotion (Moisturizing lotion) 1 x Per Week Discharge Instructions: Apply moisturizing lotion as directed Topical: Gentamicin 1 x Per Week Discharge Instructions: As directed by physician Prim Dressing: KerraCel Ag Gelling Fiber Dressing, 4x5 in (silver alginate) 1 x Per Week ary Discharge Instructions: Apply silver alginate to wound bed as instructed Secondary Dressing: Woven Gauze Sponge, Non-Sterile 4x4 in  1 x Per Week Discharge Instructions: Apply over primary dressing as directed. Compression Wrap: ThreePress (3 layer compression wrap) 1 x Per Week Discharge Instructions: Apply three layer compression as directed. Wound #3 - Lower Leg Wound Laterality: Right, Medial Peri-Wound Care: Ketoconazole Cream 2% 1 x Per Week Discharge Instructions: Apply Ketoconazole to periwoiund rash Peri-Wound Care: Triamcinolone 15 (g) 1 x Per Week Discharge Instructions: Use triamcinolone 15 (g) as directed Peri-Wound Care: Sween Lotion (Moisturizing lotion) 1 x Per Week Discharge Instructions: Apply moisturizing lotion as  directed Topical: Gentamicin 1 x Per Week Discharge Instructions: As directed by physician Prim Dressing: KerraCel Ag Gelling Fiber Dressing, 4x5 in (silver alginate) 1 x Per Week ary Discharge Instructions: Apply silver alginate to wound bed as instructed Secondary Dressing: ABD Pad, 8x10 1 x Per Week Discharge Instructions: Apply over primary dressing as directed. Secondary Dressing: Woven Gauze Sponge, Non-Sterile 4x4 in 1 x Per Week Discharge Instructions: Apply over primary dressing as directed. Compression Wrap: ThreePress (3 layer compression wrap) 1 x Per Week Discharge Instructions: Apply three layer compression as directed. Laboratory naerobe culture (MICRO) Bacteria identified in Unspecified specimen by A LOINC Code: 630-1 Convenience Name: Anaerobic culture Patient Medications llergies: No Known Allergies A Notifications Medication Indication Start End prior to debridement 03/22/2022 lidocaine DOSE topical 4 % cream - cream topical Electronic Signature(s) Signed: 03/22/2022 3:44:00 PM By: Fredirick Maudlin MD FACS Entered By: Fredirick Maudlin on 03/22/2022 15:14:23 -------------------------------------------------------------------------------- Problem List Details Patient Name: Date of Service: [name] 03/22/2022 2:00 PM Medical Record Number: [id number] Patient Account Number: [id number] Date of Birth/Sex: Treating RN: 07-Mar-1951 (71 y.o. [name] Primary Care Provider: PA [name], [place] Other Clinician: Referring Provider: Treating Provider/Extender: [name] in Treatment: 5 Active Problems ICD-10 Encounter Code Description Active Date MDM Diagnosis L97.812 Non-pressure chronic ulcer of other part of right lower leg with fat layer 02/09/2022 No Yes exposed L97.522 Non-pressure chronic ulcer of other part of left foot with fat layer exposed 02/09/2022 No Yes 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) 02/09/2022 No  Yes I10 Essential (primary) hypertension 02/09/2022 No Yes I25.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 02/09/2022 No Yes I89.0 Lymphedema, not elsewhere classified 02/09/2022 No Yes Z86.718 Personal history of other venous thrombosis and embolism 02/09/2022 No Yes L97.512 Non-pressure chronic ulcer of other part of right foot with fat layer exposed 02/17/2022 No Yes Inactive Problems Resolved Problems Electronic Signature(s) Signed: 03/22/2022 3:11:30 PM By: Fredirick Maudlin MD FACS Entered By: Fredirick Maudlin on 03/22/2022 15:11:29 -------------------------------------------------------------------------------- Progress Note Details Patient Name: Date of Service: [name] 03/22/2022 2:00 PM Medical Record Number: [id number] Patient Account Number: [id number] Date of Birth/Sex: Treating RN: 05-04-1951 (71 y.o. [name] Primary Care Provider: PA [name], [place] Other Clinician: Referring Provider: Treating Provider/Extender: [name] in Treatment: 5 Subjective Chief Complaint Information obtained from Patient Patient seen for complaints of Non-Healing Wounds. History of Present Illness (HPI) ADMISSION 02/09/2022 This is a 70 year old man who recently moved to [place] from [place]. He has a history of of coronary artery disease and prior left popliteal vein DVT . He is not diabetic and does not smoke. 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 He has had multiple spinal surgeries and has limited mobility. He primarily uses a motorized wheelchair. He has had lymphedema for many years and does have lymphedema pumps although he says he does not use them frequently. He has wounds on his bilateral  lower extremities. He was receiving care in [place] for these prior to his move. They were apparently packing his wounds with Betadine soaked gauze. He has worn compression wraps in the past but not  recently. He did say that they helped tremendously. In clinic today, his right ABI is noncompressible but has good signals; the left ABI was 1.17. No formal vascular studies have been performed. On his left dorsal foot, there is a circular lesion that exposes the fat layer. There is fairly heavy slough accumulation within the wound, but no significant odor. On his right medial calf, he has multiple pinholes that have slough buildup in them and probe for about 2 to 4 mm in depth. They are draining clear fluid. On his right lateral midfoot, he has ulceration consistent with venous stasis. It is geographic and has slough accumulation. 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 02/17/2022: All of the wounds are little bit larger today and have more slough accumulation. Today, the medial calf openings are larger and probe to something that feels calcified. There is odor coming from his wounds. His vascular studies are scheduled for August 9 and 10. 02-24-2022 upon evaluation today patient presents for follow-up concerning his ongoing issues here with his lower extremities. With that being said he does have significant problems with what appears to be cellulitis unfortunately the PCR culture that was sent last week got crushed by [employer] in transit. With that being said we are going to reobtain a culture today although I am actually to do it on the right medial leg as this seems to be the most inflamed area today where there is some questionable drainage as well. I am going to remove some of the calcium deposits which are loosening obtain a deeper culture from this area. Fortunately I do not see any evidence of active infection systemically which is good news but locally I think we are having a bigger issue here. He does have his appointment next Thursday with vascular. Patient has also been referred to [facility] for further  evaluation and treatment of the calcinosis for possible sulfate injections. 03/04/2022: The culture that was performed last week grew out multiple species including Klebsiella, Morganella, and Pseudomonas aeruginosa. He had been prescribed Bactrim but this was not adequate to cover all of the species and levofloxacin was recommended. He completed the Bactrim but did not pick up the levofloxacin and begin taking it until yesterday. 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 He had arterial studies performed today. He does have evidence of peripheral arterial disease. Results copied here: +-------+-----------+-----------+------------+------------+ ABI/TBIT oday's ABIT oday's TBIPrevious ABIPrevious TBI +-------+-----------+-----------+------------+------------+ Right [place] 0.75    +-------+-----------+-----------+------------+------------+ Left [place] 0.57    +-------+-----------+-----------+------------+------------+ Arterial wall calcification precludes accurate ankle pressures and ABIs. Summary: Right: Resting right ankle-brachial index indicates noncompressible right lower extremity arteries. The right toe-brachial index is normal. Left: Resting left ankle-brachial index indicates noncompressible left lower extremity arteries. The left toe-brachial index is abnormal. He continues to have further tissue breakdown at the right medial calf and there is ample slough accumulation along with necrotic subcutaneous tissue. The left dorsal foot wound has built up slough, as well. The right medial foot wound is nearly closed with just a light layer of eschar over the surface. The right dorsal  lateral foot wound has also accumulated a fair amount of slough and remains quite tender. 03/10/2022: He has been taking the prescribed  levofloxacin and has about 3 days left of this. The erythema and induration on his right leg has improved. He continues to experience further breakdown of the right medial calf wound. There is extensive slough and debris accumulation there. There is heavy slough on his left dorsal foot wound, but no odor or purulent drainage. The right medial foot wound appears to be closed. The right dorsal lateral foot wound also has a fair amount of slough but it is less tender than at our last visit. He still does not have an appointment with dermatology. 03/22/2022: The right anterior tibial wound has closed. The right lateral foot wound is nearly closed with just a little bit of slough. The left dorsal foot wound is smaller and continues to have some slough buildup but less so than at prior visits. There is good granulation tissue underlying the slough. Unfortunately the right medial calf wound continues to deteriorate. It has a foul odor today and a lot of necrotic tissue, the surrounding skin is also erythematous and moist. Patient History Information obtained from Patient, Chart. Family History Cancer - Mother, Heart Disease - Father,Mother, Hypertension - Father,Siblings, Lung Disease - Siblings, No family history of Diabetes, Hereditary Spherocytosis, Kidney Disease, Seizures, Stroke, Thyroid Problems, Tuberculosis. Social History Former smoker - quit 1991, Marital Status - Married, Alcohol Use - Moderate, Drug Use - No History, Caffeine Use - Daily - coffee. Medical History Eyes Patient has history of Cataracts - right eye removed Denies history of Glaucoma, Optic Neuritis Ear/Nose/Mouth/Throat Denies history of Chronic sinus problems/congestion, Middle ear problems Hematologic/Lymphatic Patient has history of Anemia - macrocytic, Lymphedema Cardiovascular Patient has history of Congestive Heart Failure, Deep Vein Thrombosis - left leg, Hypertension, Peripheral Venous Disease Endocrine Denies  history of Type I Diabetes, Type II Diabetes Genitourinary Denies history of End Stage Renal Disease Integumentary (Skin) Denies history of History of Burn Oncologic Denies history of Received Chemotherapy, Received Radiation Psychiatric Denies history of Anorexia/bulimia, Confinement Anxiety Hospitalization/Surgery History - cervical fusion. - lumbar surgery. - coronary stent placement. - lasik eye surgery. - hydrocele excision. Medical A Surgical History Notes nd Constitutional Symptoms (General Health) obesity Ear/Nose/Mouth/Throat hard of hearing Respiratory frozen diaphragm right Cardiovascular hyperlipidemia Musculoskeletal myositis, lumbar DDD, spinal stenosis, cervical facet joint syndrome Objective Constitutional Hypertensive, asymptomatic. No acute distress.. Vitals Time Taken: 2:21 PM, Temperature: 98.6 F, Pulse: 60 bpm, Respiratory Rate: 18 breaths/min, Blood Pressure: 159/72 mmHg. Respiratory Normal work of breathing on room air.. General Notes: 03/22/2022: The right anterior tibial wound has closed. The right lateral foot wound is nearly closed with just a little bit of slough. The left dorsal foot wound is smaller and continues to have some slough buildup but less so than at prior visits. There is good granulation tissue underlying the slough. Unfortunately the right medial calf wound continues to deteriorate. It has a foul odor today and a lot of necrotic tissue, the surrounding skin is also erythematous and moist. Integumentary (Hair, Skin) Wound #1 status is Open. Original cause of wound was Gradually Appeared. The date acquired was: 12/07/2021. The wound has been in treatment 5 weeks. The wound is located on the Left,Dorsal Foot. The wound measures 2.3cm length x 2.2cm width x 0.5cm depth; 3.974cm^2 area and 1.987cm^3 volume. There is Fat Layer (Subcutaneous Tissue) exposed. There is no tunneling or undermining noted. There is a large amount of serosanguineous  drainage noted. The wound margin is well defined and not attached to the wound base. There is medium (34-66%) red, pink granulation within the wound bed. There is a medium (34-66%) amount of necrotic tissue within the wound bed including Adherent Slough. Wound #2 status is Open. Original cause of wound was Gradually Appeared. The date acquired was: 11/02/2021. The wound has been in treatment 5 weeks. The wound is located on the Right,Lateral Foot. The wound measures 0.9cm length x 0.7cm width x 0.1cm depth; 0.495cm^2 area and 0.049cm^3 volume. There is Fat Layer (Subcutaneous Tissue) exposed. There is no tunneling or undermining noted. There is a small amount of serosanguineous drainage noted. The wound margin is flat and intact. There is no granulation within the wound bed. There is a large (67-100%) amount of necrotic tissue within the wound bed including Adherent Slough. Wound #3 status is Open. Original cause of wound was Gradually Appeared. The date acquired was: 12/07/2021. The wound has been in treatment 5 weeks. The wound is located on the Right,Medial Lower Leg. The wound measures 2.8cm length x 4cm width x 0.8cm depth; 8.796cm^2 area and 7.037cm^3 volume. There is Fat Layer (Subcutaneous Tissue) exposed. There is no tunneling or undermining noted. There is a large amount of purulent drainage noted. Foul odor after cleansing was noted. The wound margin is distinct with the outline attached to the wound base. There is small (1-33%) red granulation within the wound bed. There is a large (67-100%) amount of necrotic tissue within the wound bed including Adherent Slough. General Notes: calcium deposits exposed Wound #4 status is Open. Original cause of wound was Gradually Appeared. The date acquired was: 02/17/2022. The wound has been in treatment 4 weeks. The wound is located on the Right,Medial Foot. The wound measures 0cm length x 0cm width x 0cm depth; 0cm^2 area and 0cm^3 volume. There is no  tunneling or undermining noted. There is a none present amount of drainage noted. There is no granulation within the wound bed. There is no necrotic tissue within the wound bed. Wound #5 status is Open. Original cause of wound was Gradually Appeared. The date acquired was: 03/03/2022. The wound has been in treatment 2 weeks. The wound is located on the Right,Anterior Lower Leg. The wound measures 0cm length x 0cm width x 0cm depth; 0cm^2 area and 0cm^3 volume. There is no tunneling or undermining noted. There is a none present amount of drainage noted. There is no granulation within the wound bed. There is no necrotic tissue within the wound bed. 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 There was a Excisional Skin/Subcutaneous Tissue Debridement with a total area of 5.06 sq cm performed by Fredirick Maudlin, MD. With the following instrument(s): Curette to remove Viable and Non-Viable tissue/material. Material removed includes Subcutaneous Tissue, Slough, and Other: calcium deposits after achieving pain control using Lidocaine 4% T opical Solution. A time out was conducted at 15:00, prior to the start of the procedure. A Minimum amount of bleeding was controlled with Pressure. The procedure  was tolerated well with a pain level of 4 throughout and a pain level of 2 following the procedure. Post Debridement Measurements:  2.3cm length x 2.2cm width x 0.5cm depth; 1.987cm^3 volume. Character of Wound/Ulcer Post Debridement requires further debridement. Post procedure Diagnosis Wound #1: Same as Pre-Procedure Pre-procedure diagnosis of Wound #1 is a Lymphedema located on the Left,Dorsal Foot . There was a Three Layer Compression Therapy Procedure by Baruch Gouty, RN. Post procedure Diagnosis Wound #1: Same as Pre-Procedure Wound #2 Pre-procedure diagnosis of Wound #2 is a Lymphedema located on the Right,Lateral Foot . There was a Selective/Open Wound Non-Viable Tissue Debridement with a total area of 0.63 sq cm performed by Fredirick Maudlin, MD. With the following instrument(s): Curette to remove Non-Viable tissue/material. Material removed includes [facility] and Other: calcium deposits after achieving pain control using Lidocaine 4% T opical Solution. No specimens were taken. A time out was conducted at 15:00, prior to the start of the procedure. A Minimum amount of bleeding was controlled with Pressure. The procedure was tolerated well with a pain level of 4 throughout and a pain level of 2 following the procedure. Post Debridement Measurements: 0.9cm length x 0.7cm width x 0.1cm depth; 0.049cm^3 volume. Character of Wound/Ulcer Post Debridement is improved. Post procedure Diagnosis Wound #2: Same as Pre-Procedure Wound #3 Pre-procedure diagnosis of Wound #3 is a Lymphedema located on the Right,Medial Lower Leg . There was a Excisional Skin/Subcutaneous Tissue Debridement with a total area of 11.2 sq cm performed by Fredirick Maudlin, MD. With the following instrument(s): Curette to remove Viable and Non-Viable tissue/material. Material removed includes Subcutaneous Tissue, Slough, and Other: calcium deposits after achieving pain control using Lidocaine 4% T opical Solution. 1 specimen was taken by a Tissue Culture and sent to the lab per facility protocol. A time out was conducted at 15:00, prior to the start of  the procedure. A Minimum amount of bleeding was controlled with Pressure. The procedure was tolerated well with a pain level of 4 throughout and a pain level of 2 following the procedure. Post Debridement Measurements: 2.8cm length x 4cm width x 0.8cm depth; 7.037cm^3 volume. Character of Wound/Ulcer Post Debridement requires further debridement. Post procedure Diagnosis Wound #3: Same as Pre-Procedure Pre-procedure diagnosis of Wound #3 is a Lymphedema located on the Right,Medial Lower Leg . There was a Three Layer Compression Therapy Procedure by Baruch Gouty, RN. Post procedure Diagnosis Wound #3: Same as Pre-Procedure Plan Follow-up Appointments: Return Appointment in 1 week. - Dr. [name] Rm 1 Tuesday 9/5 at 10:30 am Anesthetic: Wound #1 Left,Dorsal Foot: (In clinic) Topical Lidocaine 4% applied to wound bed Wound #2 Right,Lateral Foot: (In clinic) Topical Lidocaine 4% applied to wound bed Wound #3 Right,Medial Lower Leg: (In clinic) Topical Lidocaine 4% applied to wound bed Bathing/ Shower/ Hygiene: May shower with protection but do not get wound dressing(s) wet. Edema Control - Lymphedema / SCD / Other: Lymphedema Pumps. Use Lymphedema pumps on leg(s) 2-3 times a day for 45-60 minutes. If wearing any wraps or hose, do not remove them. Continue exercising as instructed. - use 1-2 times per day over wraps Elevate legs to the level of the heart or above for 30 minutes daily and/or when sitting, a frequency of: Exercise regularly Laboratory ordered were: Anaerobic culture The following medication(s) was prescribed: lidocaine topical 4 % cream cream topical for prior to debridement was prescribed at facility WOUND #1: - Foot Wound Laterality: Dorsal, Left Peri-Wound Care: Triamcinolone 15 (g) 1 x Per Week/ Discharge Instructions: Use  triamcinolone 15 (g) as directed Peri-Wound Care: Zinc Oxide Ointment 30g tube 1 x Per Week/ Discharge Instructions: Apply Zinc Oxide to periwound  with each dressing change Peri-Wound Care: Sween Lotion (Moisturizing lotion) 1 x Per Week/ Discharge Instructions: Apply moisturizing lotion as directed Topical: Gentamicin 1 x Per Week/ Discharge Instructions: As directed by physician Prim Dressing: KerraCel Ag Gelling Fiber Dressing, 4x5 in (silver alginate) 1 x Per Week/ ary Discharge Instructions: Apply silver alginate to wound bed as instructed Secondary Dressing: Woven Gauze Sponge, Non-Sterile 4x4 in 1 x Per Week/ Discharge Instructions: Apply over primary dressing as directed. Secondary Dressing: Zetuvit Plus 4x8 in 1 x Per Week/ Discharge Instructions: Apply over primary dressing as directed. Com pression Wrap: ThreePress (3 layer compression wrap) 1 x Per Week/ Discharge Instructions: Apply three layer compression as directed. WOUND #2: - Foot Wound Laterality: Right, Lateral Peri-Wound Care: Sween Lotion (Moisturizing lotion) 1 x Per Week/ Discharge Instructions: Apply moisturizing lotion as directed Topical: Gentamicin 1 x Per Week/ Discharge Instructions: As directed by physician Prim Dressing: KerraCel Ag Gelling Fiber Dressing, 4x5 in (silver alginate) 1 x Per Week/ ary Discharge Instructions: Apply silver alginate to wound bed as instructed Secondary Dressing: Woven Gauze Sponge, Non-Sterile 4x4 in 1 x Per Week/ Discharge Instructions: Apply over primary dressing as directed. Com pression Wrap: ThreePress (3 layer compression wrap) 1 x Per Week/ Discharge Instructions: Apply three layer compression as directed. WOUND #3: - Lower Leg Wound Laterality: Right, Medial Peri-Wound Care: Ketoconazole Cream 2% 1 x Per Week/ Discharge Instructions: Apply Ketoconazole to periwoiund rash Peri-Wound Care: Triamcinolone 15 (g) 1 x Per Week/ Discharge Instructions: Use triamcinolone 15 (g) as directed Peri-Wound Care: Sween Lotion (Moisturizing lotion) 1 x Per Week/ Discharge Instructions: Apply moisturizing lotion as  directed Topical: Gentamicin 1 x Per Week/ Discharge Instructions: As directed by physician Prim Dressing: KerraCel Ag Gelling Fiber Dressing, 4x5 in (silver alginate) 1 x Per Week/ ary Discharge Instructions: Apply silver alginate to wound bed as instructed Secondary Dressing: ABD Pad, 8x10 1 x Per Week/ Discharge Instructions: Apply over primary dressing as directed. Secondary Dressing: Woven Gauze Sponge, Non-Sterile 4x4 in 1 x Per Week/ Discharge Instructions: Apply over primary dressing as directed. Com pression Wrap: ThreePress (3 layer compression wrap) 1 x Per Week/ Discharge Instructions: Apply three layer compression as directed. 03/22/2022: The right anterior tibial wound has closed. The right lateral foot wound is nearly closed with just a little bit of slough. The left dorsal foot wound is smaller and continues to have some slough buildup but less so than at prior visits. There is good granulation tissue underlying the slough. Unfortunately the right medial calf wound continues to deteriorate. It has a foul odor today and a lot of necrotic tissue, the surrounding skin is also erythematous and moist. He has completed his levofloxacin. Despite antibiotic therapy, the right medial calf wound has gotten worse. I debrided necrotic subcutaneous tissue, slough, and chunks of calcium from the wound. I then took a culture with an eye toward [facility] topical antibiotic therapy and reinitiation of oral antimicrobial. The 2 foot wounds were also debrided, slough from the right and slough and nonviable subcutaneous tissue from the left. For now, we will apply topical gentamicin with silver alginate and 3 layer compression bilaterally. Follow-up in 1 week. Electronic Signature(s) Signed: 03/22/2022 3:15:36 PM By: Fredirick Maudlin MD FACS Entered By: Fredirick Maudlin on 03/22/2022 15:15:36 -------------------------------------------------------------------------------- HxROS Details Patient Name:  Date of Service: Pamalee Leyden, [name] 03/22/2022 2:00 PM Medical  Record Number: [id number] Patient Account Number: [id number] Date of Birth/Sex: Treating RN: 07/30/1950 (71 y.o. [name] Primary Care Provider: PA [name], [place] Other Clinician: Referring Provider: Treating Provider/Extender: [name] in Treatment: 5 Information Obtained From Patient Chart Constitutional Symptoms (General Health) Medical History: Past Medical History Notes: obesity Eyes Medical History: Positive for: Cataracts - right eye removed Negative for: Glaucoma; Optic Neuritis Ear/Nose/Mouth/Throat Medical History: Negative for: Chronic sinus problems/congestion; Middle ear problems Past Medical History Notes: hard of hearing Hematologic/Lymphatic Medical History: Positive for: Anemia - macrocytic; Lymphedema Respiratory Medical History: Past Medical History Notes: frozen diaphragm right Cardiovascular Medical History: Positive for: Congestive Heart Failure; Deep Vein Thrombosis - left leg; Hypertension; Peripheral Venous Disease Past Medical History Notes: hyperlipidemia Endocrine Medical History: Negative for: Type I Diabetes; Type II Diabetes Genitourinary Medical History: Negative for: End Stage Renal Disease Integumentary (Skin) Medical History: Negative for: History of Burn Musculoskeletal Medical History: Past Medical History Notes: myositis, lumbar DDD, spinal stenosis, cervical facet joint syndrome Oncologic Medical History: Negative for: Received Chemotherapy; Received Radiation Psychiatric Medical History: Negative for: Anorexia/bulimia; Confinement Anxiety HBO Extended History Items Eyes: Cataracts Immunizations Pneumococcal Vaccine: Received Pneumococcal Vaccination: Yes Received Pneumococcal Vaccination On or After 60th Birthday: Yes Implantable Devices No devices added Hospitalization / Surgery History Type of  Hospitalization/Surgery cervical fusion lumbar surgery coronary stent placement lasik eye surgery hydrocele excision Family and Social History Cancer: Yes - Mother; Diabetes: No; Heart Disease: Yes - Father,Mother; Hereditary Spherocytosis: No; Hypertension: Yes - Father,Siblings; Kidney Disease: No; Lung Disease: Yes - Siblings; Seizures: No; Stroke: No; Thyroid Problems: No; Tuberculosis: No; Former smoker - quit 1991; Marital Status - Married; Alcohol Use: Moderate; Drug Use: No History; Caffeine Use: Daily - coffee; Financial Concerns: No; Food, Clothing or Shelter Needs: No; Support System Lacking: No; Transportation Concerns: No Engineer, maintenance) Signed: 03/22/2022 3:44:00 PM By: Fredirick Maudlin MD FACS Signed: 03/23/2022 6:08:33 PM By: Baruch Gouty RN, BSN Entered By: Fredirick Maudlin on 03/22/2022 15:13:45 -------------------------------------------------------------------------------- SuperBill Details Patient Name: Date of Service: FALLON, HOWERTER 03/22/2022 Medical Record Number: [id number] Patient Account Number: [id number] Date of Birth/Sex: Treating RN: Jun 14, 1951 (71 y.o. [name] Primary Care Provider: PA [name], [place] Other Clinician: Referring Provider: Treating Provider/Extender: [name] in Treatment: 5 Diagnosis Coding ICD-10 Codes Code Description [phone] 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d L97.522 Non-pressure chronic ulcer of other part of left foot with fat layer exposed 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) I10 Essential (primary) hypertension I25.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 I89.0 Lymphedema, not elsewhere classified Z86.718 Personal 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 Facility Procedures CPT4 Code:  [id number] Description: [facility] - DEB SUBQ TISSUE 20 SQ CM/< ICD-10 Diagnosis Description 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 L97.522 Non-pressure chronic ulcer of other part of left foot with fat layer exposed Modifier: ed Quantity: 1 CPT4 Code: [id number] Description: 06301 - DEBRIDE WOUND 1ST 20 SQ CM OR < ICD-10 Diagnosis Description L97.512 Non-pressure chronic ulcer of other part of right foot with fat layer exposed Modifier: Quantity: 1 Physician Procedures : CPT4 Code Description Modifier [id number] - WC PHYS LEVEL 4 - EST PT 25 ICD-10 Diagnosis Description 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d L97.522 Non-pressure chronic ulcer  of other part of left  foot with fat layer exposed L97.512 Non-pressure chronic ulcer of other part of right foot with fat layer exposed 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) Quantity: 1 : [id number] - WC PHYS SUBQ TISS 20 SQ CM ICD-10 Diagnosis Description 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d L97.522 Non-pressure chronic ulcer of other part of left foot with fat layer exposed Quantity: 1 : [id number] - WC PHYS DEBR WO ANESTH 20 SQ CM 1 ICD-10 Diagnosis Description L97.512 Non-pressure chronic ulcer of other part of right foot with fat layer exposed Quantity: Electronic Signature(s) Signed: 03/22/2022 3:16:24 PM By: Fredirick Maudlin MD FACS Entered By: Fredirick Maudlin on 03/22/2022 15:16:24

## 2022-03-25 ENCOUNTER — Ambulatory Visit (HOSPITAL_COMMUNITY)
Admission: RE | Admit: 2022-03-25 | Discharge: 2022-03-25 | Disposition: A | Discharge status: Home / Self Care | Payer: Medicare Other | Source: Ambulatory Visit | Attending: Physician Assistant | Admitting: Physician Assistant

## 2022-03-25 DIAGNOSIS — D696 Thrombocytopenia, unspecified: Secondary | ICD-10-CM | POA: Diagnosis not present

## 2022-03-25 DIAGNOSIS — K760 Fatty (change of) liver, not elsewhere classified: Secondary | ICD-10-CM | POA: Diagnosis not present

## 2022-03-26 MED ORDER — ATENOLOL 25 MG PO TABS: 25.0000 mg | ORAL_TABLET | Freq: Two times a day (BID) | ORAL | 3 refills | Status: DC

## 2022-03-26 NOTE — Addendum Note (Signed)
Addended by: Carter Kitten D on: 03/26/2022 08:17 AM   Modules accepted: Orders

## 2022-03-30 ENCOUNTER — Encounter (HOSPITAL_BASED_OUTPATIENT_CLINIC_OR_DEPARTMENT_OTHER): Payer: Medicare Other | Attending: General Surgery | Admitting: General Surgery

## 2022-03-30 DIAGNOSIS — L97522 Non-pressure chronic ulcer of other part of left foot with fat layer exposed: Secondary | ICD-10-CM | POA: Insufficient documentation

## 2022-03-30 DIAGNOSIS — L942 Calcinosis cutis: Secondary | ICD-10-CM | POA: Diagnosis not present

## 2022-03-30 DIAGNOSIS — Z86718 Personal history of other venous thrombosis and embolism: Secondary | ICD-10-CM | POA: Insufficient documentation

## 2022-03-30 DIAGNOSIS — I890 Lymphedema, not elsewhere classified: Secondary | ICD-10-CM | POA: Insufficient documentation

## 2022-03-30 DIAGNOSIS — I251 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: Secondary | ICD-10-CM | POA: Diagnosis not present

## 2022-03-30 DIAGNOSIS — Z87891 Personal history of nicotine dependence: Secondary | ICD-10-CM | POA: Insufficient documentation

## 2022-03-30 DIAGNOSIS — Z993 Dependence on wheelchair: Secondary | ICD-10-CM | POA: Diagnosis not present

## 2022-03-30 DIAGNOSIS — I872 Venous insufficiency (chronic) (peripheral): Secondary | ICD-10-CM | POA: Diagnosis not present

## 2022-03-30 DIAGNOSIS — I509 Heart failure, unspecified: Secondary | ICD-10-CM | POA: Diagnosis not present

## 2022-03-30 DIAGNOSIS — L97812 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d: Secondary | ICD-10-CM | POA: Insufficient documentation

## 2022-03-30 DIAGNOSIS — I110 Hypertensive heart disease with heart failure: Secondary | ICD-10-CM | POA: Diagnosis not present

## 2022-03-30 DIAGNOSIS — L97512 Non-pressure chronic ulcer of other part of right foot with fat layer exposed: Secondary | ICD-10-CM | POA: Insufficient documentation

## 2022-03-30 NOTE — Progress Notes (Signed)
HUSTON, STONEHOCKER ([id number]) Visit Report for 03/30/2022 Arrival Information Details Patient Name: Date of Service: [name], [name] 03/30/2022 10:30 A M Medical Record Number: [id number] Patient Account Number: [id number] Date of Birth/Sex: Treating RN: 1950/12/09 (71 y.o. [name] Primary Care Rodnesha Elie: PA [name], [place] Other Clinician: Referring Shakiera Edelson: Treating Sharmel Ballantine/Extender: [name] in Treatment: 7 Visit Information History Since Last Visit Added or deleted any medications: No Patient Arrived: Ambulatory Any new allergies or adverse reactions: No Arrival Time: 10:37 Had a fall or experienced change in No Accompanied By: self activities of daily living that may affect Transfer Assistance: None risk of falls: Patient Identification Verified: Yes Signs or symptoms of abuse/neglect since last visito No Secondary Verification Process Completed: Yes Hospitalized since last visit: No Patient Requires Transmission-Based Precautions: No Implantable device outside of the clinic excluding No Patient Has Alerts: Yes cellular tissue based products placed in the center Patient Alerts: R ABI= N/C, TBI= .75 since last visit: L ABI =N/C, TBI = .57 Has Dressing in Place as Prescribed: Yes Has Compression in Place as Prescribed: Yes Pain Present Now: No Electronic Signature(s) Signed: 03/30/2022 4:56:45 PM By: Sharyn Creamer RN, BSN Entered By: Sharyn Creamer on 03/30/2022 10:39:40 -------------------------------------------------------------------------------- Compression Therapy Details Patient Name: Date of Service: [name] 03/30/2022 10:30 A M Medical Record Number: [id number] Patient Account Number: [id number] Date of Birth/Sex: Treating RN: 01-13-1951 (71 y.o. [name] Primary Care Neriah Brott: PA [name], [place] Other Clinician: Referring Jaciel Diem: Treating Morry Veiga/Extender: [name] in Treatment: 7 Compression Therapy  Performed for Wound Assessment: Wound #1 Left,Dorsal Foot Performed By: Clinician Sharyn Creamer, RN Compression Type: Three Layer Post Procedure Diagnosis Same as Pre-procedure Electronic Signature(s) Signed: 03/30/2022 4:56:45 PM By: Sharyn Creamer RN, BSN Entered By: Sharyn Creamer on 03/30/2022 11:11:33 -------------------------------------------------------------------------------- Compression Therapy Details Patient Name: Date of Service: [name] 03/30/2022 10:30 A M Medical Record Number: [id number] Patient Account Number: [id number] Date of Birth/Sex: Treating RN: [birth year] (71 y.o. [name] Primary Care Gerlean Cid: PA [name], [place] Other Clinician: Referring Shantia Sanford: Treating Amantha Sklar/Extender: [name] in Treatment: 7 Compression Therapy Performed for Wound Assessment: Wound #2 Right,Lateral Foot Performed By: Clinician Sharyn Creamer, RN Compression Type: Three Layer Post Procedure Diagnosis Same as Pre-procedure Electronic Signature(s) Signed: 03/30/2022 4:56:45 PM By: Sharyn Creamer RN, BSN Entered By: Sharyn Creamer on 03/30/2022 11:11:33 -------------------------------------------------------------------------------- Compression Therapy Details Patient Name: Date of Service: [name] 03/30/2022 10:30 A M Medical Record Number: [id number] Patient Account Number: [id number] Date of Birth/Sex: Treating RN: May 16, 1951 (71 y.o. [name] Primary Care Georgene Kopper: PA [name], [place] Other Clinician: Referring Janaiya Beauchesne: Treating Sunnie Odden/Extender: [name] in Treatment: 7 Compression Therapy Performed for Wound Assessment: Wound #3 Right,Medial Lower Leg Performed By: Clinician Sharyn Creamer, RN Compression Type: Three Layer Post Procedure Diagnosis Same as Pre-procedure Electronic Signature(s) Signed: 03/30/2022 4:56:45 PM By: Sharyn Creamer RN, BSN Entered By: Sharyn Creamer on 03/30/2022  11:11:34 -------------------------------------------------------------------------------- Encounter Discharge Information Details Patient Name: Date of Service: [name] 03/30/2022 10:30 A M Medical Record Number: [id number] Patient Account Number: [id number] Date of Birth/Sex: Treating RN: 12/20/1950 (71 y.o. [name] Primary Care Jettson Crable: PA [name], [place] Other Clinician: Referring Eason Housman: Treating Hayly Litsey/Extender: [name] in Treatment: 7 Encounter Discharge Information Items Post Procedure Vitals Discharge Condition: Stable Temperature (F): 97.9 Ambulatory Status: Wheelchair Pulse (bpm): 73 Discharge Destination: Home Respiratory Rate (breaths/min): 18 Transportation: Private Auto Blood Pressure (mmHg): 164/84 Accompanied  By: self Schedule Follow-up Appointment: Yes Clinical Summary of Care: Patient Declined Electronic Signature(s) Signed: 03/30/2022 4:56:45 PM By: Sharyn Creamer RN, BSN Entered By: Sharyn Creamer on 03/30/2022 11:12:40 -------------------------------------------------------------------------------- Lower Extremity Assessment Details Patient Name: Date of Service: [name], [name] 03/30/2022 10:30 A M Medical Record Number: [id number] Patient Account Number: [id number] Date of Birth/Sex: Treating RN: 06-13-1951 (71 y.o. [name] Primary Care Linday Rhodes: PA [name], [place] Other Clinician: Referring Jerick Khachatryan: Treating Bari Handshoe/Extender: Valeria Batman Weeks in Treatment: 7 Edema Assessment Assessed: [Left: No] [Right: No] Edema: [Left: Yes] [Right: Yes] Calf Left: Right: Point of Measurement: From Medial Instep 41 cm 43.4 cm Ankle Left: Right: Point of Measurement: From Medial Instep 24.5 cm 25.5 cm Vascular Assessment Pulses: Dorsalis Pedis Palpable: [Left:Yes] [Right:Yes] Electronic Signature(s) Signed: 03/30/2022 4:56:45 PM By: Sharyn Creamer RN, BSN Entered By: Sharyn Creamer on 03/30/2022  10:57:13 -------------------------------------------------------------------------------- Multi Wound Chart Details Patient Name: Date of Service: [name] 03/30/2022 10:30 A M Medical Record Number: [id number] Patient Account Number: [id number] Date of Birth/Sex: Treating RN: Oct 04, 1950 (71 y.o. M) Primary Care Constantino Starace: PA [name], [place] Other Clinician: Referring Jaimarie Rapozo: Treating Micala Saltsman/Extender: [name], [name] in Treatment: 7 Vital Signs Height(in): Pulse(bpm): 17 Weight(lbs): Blood Pressure(mmHg): 164/84 Body Mass Index(BMI): Temperature(F): 97.9 Respiratory Rate(breaths/min): 18 Photos: Left, Dorsal Foot Right, Lateral Foot Right, Medial Lower Leg Wound Location: Gradually Appeared Gradually Appeared Gradually Appeared Wounding Event: Lymphedema Lymphedema Lymphedema Primary Etiology: Cataracts, Anemia, Lymphedema, Cataracts, Anemia, Lymphedema, Cataracts, Anemia, Lymphedema, Comorbid History: Congestive Heart Failure, Deep Vein Congestive Heart Failure, Deep Vein Congestive Heart Failure, Deep Vein Thrombosis, Hypertension, Peripheral Thrombosis, Hypertension, Peripheral Thrombosis, Hypertension, Peripheral Venous Disease Venous Disease Venous Disease 12/07/2021 11/02/2021 12/07/2021 Date Acquired: '7 7 7 '$ Weeks of Treatment: Open Open Open Wound Status: No No No Wound Recurrence: 2.3x2.4x0.3 0.5x1x0.1 2.3x3.6x1 Measurements L x W x D (cm) 4.335 0.393 6.503 A (cm) : rea 1.301 0.039 6.503 Volume (cm) : -14.30% 90.40% -21.80% % Reduction in Area: 14.20% 90.40% -204.40% % Reduction in Volume: Full Thickness Without Exposed Full Thickness Without Exposed Full Thickness With Exposed Support Classification: Support Structures Support Structures Structures Large Small Large Exudate Amount: Serosanguineous Serosanguineous Purulent Exudate Type: red, brown red, brown yellow, brown, green Exudate Color: No No Yes Foul Odor A  Cleansing: fter N/A N/A No Odor Anticipated Due to Product Use: Well defined, not attached Flat and Intact Distinct, outline attached Wound Margin: Medium (34-66%) None Present (0%) Medium (34-66%) Granulation A mount: Red, Pink N/A Red Granulation Quality: Medium (34-66%) Large (67-100%) Medium (34-66%) Necrotic Amount: Fat Layer (Subcutaneous Tissue): Yes Fat Layer (Subcutaneous Tissue): Yes Fat Layer (Subcutaneous Tissue): Yes Exposed Structures: Fascia: No Fascia: No Fascia: No Tendon: No Tendon: No Tendon: No Muscle: No Muscle: No Muscle: No Joint: No Joint: No Joint: No Bone: No Bone: No Bone: No Small (1-33%) Medium (34-66%) Small (1-33%) Epithelialization: Debridement - Excisional Debridement - Selective/Open Wound Debridement - Excisional Debridement: Pre-procedure Verification/Time Out 11:02 11:02 11:02 Taken: Lidocaine 5% topical ointment Lidocaine 5% topical ointment Lidocaine 5% topical ointment Pain Control: Subcutaneous, Psychologist, prison and probation services, [employer], Subcutaneous, [employer] Tissue Debrided: Skin/Subcutaneous Tissue Non-Viable Tissue Skin/Subcutaneous Tissue Level: 5.52 0.5 8.28 Debridement A (sq cm): rea Curette Curette Curette Instrument: Minimum Minimum Minimum Bleeding: Pressure Pressure Pressure Hemostasis A chieved: 0 0 0 Procedural Pain: 0 0 0 Post Procedural Pain: Procedure was tolerated well Procedure was tolerated well Procedure was tolerated well Debridement Treatment Response: 2.3x2.4x0.3 0.5x1x0.1 2.3x3.6x1 Post Debridement Measurements L x W x D (  cm) 1.301 0.039 6.503 Post Debridement Volume: (cm) Compression Therapy Compression Therapy Compression Therapy Procedures Performed: Debridement Debridement Debridement Treatment Notes Wound #1 (Foot) Wound Laterality: Dorsal, Left Cleanser Peri-Wound Care Triamcinolone 15 (g) Discharge Instruction: Use triamcinolone 15 (g) as directed Zinc Oxide Ointment 30g  tube Discharge Instruction: Apply Zinc Oxide to periwound with each dressing change Sween Lotion (Moisturizing lotion) Discharge Instruction: Apply moisturizing lotion as directed Topical Gentamicin Discharge Instruction: As directed by physician Primary Dressing KerraCel Ag Gelling Fiber Dressing, 4x5 in (silver alginate) Discharge Instruction: Apply silver alginate to wound bed as instructed Secondary Dressing Woven Gauze Sponge, Non-Sterile 4x4 in Discharge Instruction: Apply over primary dressing as directed. Zetuvit Plus 4x8 in Discharge Instruction: Apply over primary dressing as directed. Secured With Compression Wrap ThreePress (3 layer compression wrap) Discharge Instruction: Apply three layer compression as directed. Compression Stockings Add-Ons Wound #2 (Foot) Wound Laterality: Right, Lateral Cleanser Peri-Wound Care Sween Lotion (Moisturizing lotion) Discharge Instruction: Apply moisturizing lotion as directed Topical Gentamicin Discharge Instruction: As directed by physician Primary Dressing KerraCel Ag Gelling Fiber Dressing, 4x5 in (silver alginate) Discharge Instruction: Apply silver alginate to wound bed as instructed Secondary Dressing Woven Gauze Sponge, Non-Sterile 4x4 in Discharge Instruction: Apply over primary dressing as directed. Secured With Compression Wrap ThreePress (3 layer compression wrap) Discharge Instruction: Apply three layer compression as directed. Compression Stockings Add-Ons Wound #3 (Lower Leg) Wound Laterality: Right, Medial Cleanser Peri-Wound Care Ketoconazole Cream 2% Discharge Instruction: Apply Ketoconazole to periwoiund rash Triamcinolone 15 (g) Discharge Instruction: Use triamcinolone 15 (g) as directed Sween Lotion (Moisturizing lotion) Discharge Instruction: Apply moisturizing lotion as directed Topical Gentamicin Discharge Instruction: As directed by physician Primary Dressing KerraCel Ag Gelling Fiber  Dressing, 4x5 in (silver alginate) Discharge Instruction: Apply silver alginate to wound bed as instructed Secondary Dressing ABD Pad, 8x10 Discharge Instruction: Apply over primary dressing as directed. Woven Gauze Sponge, Non-Sterile 4x4 in Discharge Instruction: Apply over primary dressing as directed. Secured With Compression Wrap ThreePress (3 layer compression wrap) Discharge Instruction: Apply three layer compression as directed. Compression Stockings Add-Ons Electronic Signature(s) Signed: 03/30/2022 11:12:52 AM By: Fredirick Maudlin MD FACS Entered By: Fredirick Maudlin on 03/30/2022 11:12:52 -------------------------------------------------------------------------------- Multi-Disciplinary Care Plan Details Patient Name: Date of Service: [name] 03/30/2022 10:30 A M Medical Record Number: [id number] Patient Account Number: [id number] Date of Birth/Sex: Treating RN: 1950/10/14 (71 y.o. [name] Primary Care Beronica Lansdale: PA [name], [place] Other Clinician: Referring Christophr Calix: Treating Lamarr Feenstra/Extender: [name] in Treatment: 7 Multidisciplinary Care Plan reviewed with physician Active Inactive Venous Leg Ulcer Nursing Diagnoses: Knowledge deficit related to disease process and management Potential for venous Insuffiency (use before diagnosis confirmed) Goals: Patient will maintain optimal edema control Date Initiated: 02/17/2022 Target Resolution Date: 04/07/2022 Goal Status: Active Interventions: Assess peripheral edema status every visit. Compression as ordered Provide education on venous insufficiency Treatment Activities: Non-invasive vascular studies : 02/17/2022 Therapeutic compression applied : 02/17/2022 Notes: Wound/Skin Impairment Nursing Diagnoses: Impaired tissue integrity Knowledge deficit related to ulceration/compromised skin integrity Goals: Patient/caregiver will verbalize understanding of skin care regimen Date  Initiated: 02/17/2022 Target Resolution Date: 04/07/2022 Goal Status: Active Ulcer/skin breakdown will have a volume reduction of 30% by week 4 Date Initiated: 02/17/2022 Date Inactivated: 03/10/2022 Target Resolution Date: 03/10/2022 Unmet Reason: infection being treated, Goal Status: Unmet waiting on derm appte Ulcer/skin breakdown will have a volume reduction of 50% by week 8 Date Initiated: 03/10/2022 Target Resolution Date: 04/07/2022 Goal Status: Active Interventions: Assess patient/caregiver ability to obtain necessary supplies Assess  patient/caregiver ability to perform ulcer/skin care regimen upon admission and as needed Assess ulceration(s) every visit Provide education on ulcer and skin care Treatment Activities: Skin care regimen initiated : 02/17/2022 Topical wound management initiated : 02/17/2022 Notes: Electronic Signature(s) Signed: 03/30/2022 4:56:45 PM By: Sharyn Creamer RN, BSN Entered By: Sharyn Creamer on 03/30/2022 10:57:58 -------------------------------------------------------------------------------- Pain Assessment Details Patient Name: Date of Service: [name] 03/30/2022 10:30 A M Medical Record Number: [id number] Patient Account Number: [id number] Date of Birth/Sex: Treating RN: 1950-11-08 (71 y.o. [name] Primary Care Anant Agard: PA [name], [place] Other Clinician: Referring Jaxson Anglin: Treating Dayannara Pascal/Extender: [name] in Treatment: 7 Active Problems Location of Pain Severity and Description of Pain Patient Has Paino No Site Locations Pain Management and Medication Current Pain Management: Electronic Signature(s) Signed: 03/30/2022 4:56:45 PM By: Sharyn Creamer RN, BSN Entered By: Sharyn Creamer on 03/30/2022 10:41:02 -------------------------------------------------------------------------------- Patient/Caregiver Education Details Patient Name: Date of Service: Norris Cross 9/5/2023andnbsp10:30 A M Medical  Record Number: [id number] Patient Account Number: [id number] Date of Birth/Gender: Treating RN: 1950-08-22 (71 y.o. [name] Primary Care Physician: PA [name], [place] Other Clinician: Referring Physician: Treating Physician/Extender: [name] in Treatment: 7 Education Assessment Education Provided To: Patient Education Topics Provided Venous: Methods: Explain/Verbal Responses: State content correctly Wound/Skin Impairment: Methods: Explain/Verbal Responses: State content correctly Electronic Signature(s) Signed: 03/30/2022 4:56:45 PM By: Sharyn Creamer RN, BSN Entered By: Sharyn Creamer on 03/30/2022 10:58:24 -------------------------------------------------------------------------------- Wound Assessment Details Patient Name: Date of Service: [name] 03/30/2022 10:30 A M Medical Record Number: [id number] Patient Account Number: [id number] Date of Birth/Sex: Treating RN: 11-13-1950 (70 y.o. [name] Primary Care Jalien Weakland: PA [name], [place] Other Clinician: Referring Redell Bhandari: Treating Steffan Caniglia/Extender: Valeria Batman Weeks in Treatment: 7 Wound Status Wound Number: 1 Primary Lymphedema Etiology: Wound Location: Left, Dorsal Foot Wound Open Wounding Event: Gradually Appeared Status: Date Acquired: 12/07/2021 Comorbid Cataracts, Anemia, Lymphedema, Congestive Heart Failure, Deep Weeks Of Treatment: 7 History: Vein Thrombosis, Hypertension, Peripheral Venous Disease Clustered Wound: No Photos Wound Measurements Length: (cm) 2.3 Width: (cm) 2.4 Depth: (cm) 0.3 Area: (cm) 4.335 Volume: (cm) 1.301 % Reduction in Area: -14.3% % Reduction in Volume: 14.2% Epithelialization: Small (1-33%) Tunneling: No Undermining: No Wound Description Classification: Full Thickness Without Exposed Support Structures Wound Margin: Well defined, not attached Exudate Amount: Large Exudate Type: Serosanguineous Exudate Color:  red, brown Foul Odor After Cleansing: No Slough/Fibrino Yes Wound Bed Granulation Amount: Medium (34-66%) Exposed Structure Granulation Quality: Red, Pink Fascia Exposed: No Necrotic Amount: Medium (34-66%) Fat Layer (Subcutaneous Tissue) Exposed: Yes Necrotic Quality: Adherent Slough Tendon Exposed: No Muscle Exposed: No Joint Exposed: No Bone Exposed: No Treatment Notes Wound #1 (Foot) Wound Laterality: Dorsal, Left Cleanser Peri-Wound Care Triamcinolone 15 (g) Discharge Instruction: Use triamcinolone 15 (g) as directed Zinc Oxide Ointment 30g tube Discharge Instruction: Apply Zinc Oxide to periwound with each dressing change Sween Lotion (Moisturizing lotion) Discharge Instruction: Apply moisturizing lotion as directed Topical Gentamicin Discharge Instruction: As directed by physician Primary Dressing KerraCel Ag Gelling Fiber Dressing, 4x5 in (silver alginate) Discharge Instruction: Apply silver alginate to wound bed as instructed Secondary Dressing Woven Gauze Sponge, Non-Sterile 4x4 in Discharge Instruction: Apply over primary dressing as directed. Zetuvit Plus 4x8 in Discharge Instruction: Apply over primary dressing as directed. Secured With Compression Wrap ThreePress (3 layer compression wrap) Discharge Instruction: Apply three layer compression as directed. Compression Stockings Add-Ons Electronic Signature(s) Signed: 03/30/2022 4:56:45 PM By: Sharyn Creamer RN, BSN Entered By: Sharyn Creamer  on 03/30/2022 10:52:50 -------------------------------------------------------------------------------- Wound Assessment Details Patient Name: Date of Service: KAZUMA, ELENA 03/30/2022 10:30 A M Medical Record Number: [id number] Patient Account Number: [id number] Date of Birth/Sex: Treating RN: 06/02/1951 (71 y.o. [name] Primary Care Alyze Lauf: PA [name], [place] Other Clinician: Referring Alter Moss: Treating Ahmar Pickrell/Extender: Valeria Batman Weeks  in Treatment: 7 Wound Status Wound Number: 2 Primary Lymphedema Etiology: Wound Location: Right, Lateral Foot Wound Open Wounding Event: Gradually Appeared Status: Date Acquired: 11/02/2021 Comorbid Cataracts, Anemia, Lymphedema, Congestive Heart Failure, Deep Weeks Of Treatment: 7 History: Vein Thrombosis, Hypertension, Peripheral Venous Disease Clustered Wound: No Photos Wound Measurements Length: (cm) 0.5 Width: (cm) 1 Depth: (cm) 0.1 Area: (cm) 0.393 Volume: (cm) 0.039 % Reduction in Area: 90.4% % Reduction in Volume: 90.4% Epithelialization: Medium (34-66%) Tunneling: No Undermining: No Wound Description Classification: Full Thickness Without Exposed Support Structures Wound Margin: Flat and Intact Exudate Amount: Small Exudate Type: Serosanguineous Exudate Color: red, brown Foul Odor After Cleansing: No Slough/Fibrino Yes Wound Bed Granulation Amount: None Present (0%) Exposed Structure Necrotic Amount: Large (67-100%) Fascia Exposed: No Necrotic Quality: Adherent Slough Fat Layer (Subcutaneous Tissue) Exposed: Yes Tendon Exposed: No Muscle Exposed: No Joint Exposed: No Bone Exposed: No Treatment Notes Wound #2 (Foot) Wound Laterality: Right, Lateral Cleanser Peri-Wound Care Sween Lotion (Moisturizing lotion) Discharge Instruction: Apply moisturizing lotion as directed Topical Gentamicin Discharge Instruction: As directed by physician Primary Dressing KerraCel Ag Gelling Fiber Dressing, 4x5 in (silver alginate) Discharge Instruction: Apply silver alginate to wound bed as instructed Secondary Dressing Woven Gauze Sponge, Non-Sterile 4x4 in Discharge Instruction: Apply over primary dressing as directed. Secured With Compression Wrap ThreePress (3 layer compression wrap) Discharge Instruction: Apply three layer compression as directed. Compression Stockings Add-Ons Electronic Signature(s) Signed: 03/30/2022 4:56:45 PM By: Sharyn Creamer RN,  BSN Entered By: Sharyn Creamer on 03/30/2022 10:53:42 -------------------------------------------------------------------------------- Wound Assessment Details Patient Name: Date of Service: [name] 03/30/2022 10:30 A M Medical Record Number: [id number] Patient Account Number: [id number] Date of Birth/Sex: Treating RN: Oct 04, 1950 (71 y.o. [name] Primary Care Cashawn Yanko: PA [name], [place] Other Clinician: Referring Krystianna Soth: Treating Arabelle Bollig/Extender: Valeria Batman Weeks in Treatment: 7 Wound Status Wound Number: 3 Primary Lymphedema Etiology: Wound Location: Right, Medial Lower Leg Wound Open Wounding Event: Gradually Appeared Status: Date Acquired: 12/07/2021 Comorbid Cataracts, Anemia, Lymphedema, Congestive Heart Failure, Deep Weeks Of Treatment: 7 History: Vein Thrombosis, Hypertension, Peripheral Venous Disease Clustered Wound: No Photos Wound Measurements Length: (cm) 2.3 Width: (cm) 3.6 Depth: (cm) 1 Area: (cm) 6.503 Volume: (cm) 6.503 % Reduction in Area: -21.8% % Reduction in Volume: -204.4% Epithelialization: Small (1-33%) Tunneling: No Undermining: No Wound Description Classification: Full Thickness With Exposed Support Structures Wound Margin: Distinct, outline attached Exudate Amount: Large Exudate Type: Purulent Exudate Color: yellow, brown, green Foul Odor After Cleansing: Yes Due to Product Use: No Slough/Fibrino Yes Wound Bed Granulation Amount: Medium (34-66%) Exposed Structure Granulation Quality: Red Fascia Exposed: No Necrotic Amount: Medium (34-66%) Fat Layer (Subcutaneous Tissue) Exposed: Yes Necrotic Quality: Adherent Slough Tendon Exposed: No Muscle Exposed: No Joint Exposed: No Bone Exposed: No Treatment Notes Wound #3 (Lower Leg) Wound Laterality: Right, Medial Cleanser Peri-Wound Care Ketoconazole Cream 2% Discharge Instruction: Apply Ketoconazole to periwoiund rash Triamcinolone 15 (g) Discharge  Instruction: Use triamcinolone 15 (g) as directed Sween Lotion (Moisturizing lotion) Discharge Instruction: Apply moisturizing lotion as directed Topical Gentamicin Discharge Instruction: As directed by physician Primary Dressing KerraCel Ag Gelling Fiber Dressing, 4x5 in (silver alginate) Discharge Instruction: Apply silver alginate to wound  bed as instructed Secondary Dressing ABD Pad, 8x10 Discharge Instruction: Apply over primary dressing as directed. Woven Gauze Sponge, Non-Sterile 4x4 in Discharge Instruction: Apply over primary dressing as directed. Secured With Compression Wrap ThreePress (3 layer compression wrap) Discharge Instruction: Apply three layer compression as directed. Compression Stockings Add-Ons Electronic Signature(s) Signed: 03/30/2022 4:56:45 PM By: Sharyn Creamer RN, BSN Entered By: Sharyn Creamer on 03/30/2022 10:55:44 -------------------------------------------------------------------------------- [place] Details Patient Name: Date of Service: [name] 03/30/2022 10:30 A M Medical Record Number: [id number] Patient Account Number: [id number] Date of Birth/Sex: Treating RN: May 21, 1951 (72 y.o. [name] Primary Care Anushri Casalino: PA [name], NO Other Clinician: Referring Annais Crafts: Treating Henderson Frampton/Extender: [name] in Treatment: 7 Vital Signs Time Taken: 10:40 Temperature (F): 97.9 Pulse (bpm): 73 Respiratory Rate (breaths/min): 18 Blood Pressure (mmHg): 164/84 Reference Range: 80 - 120 mg / dl Electronic Signature(s) Signed: 03/30/2022 4:56:45 PM By: Sharyn Creamer RN, BSN Entered By: Sharyn Creamer on 03/30/2022 10:40:57

## 2022-03-30 NOTE — Progress Notes (Signed)
[name], [name] ([id number]) Visit Report for 03/30/2022 Chief Complaint Document Details Patient Name: Date of Service: [name], [name] 03/30/2022 10:30 A M Medical Record Number: [id number] Patient Account Number: [id number] Date of Birth/Sex: Treating RN: Sep 25, 1950 (71 y.o. M) Primary Care Provider: PA [name], NO Other Clinician: Referring Provider: Treating Provider/Extender: [name] in Treatment: 7 Information Obtained from: Patient Chief Complaint Patient seen for complaints of Non-Healing Wounds. Electronic Signature(s) Signed: 03/30/2022 11:13:00 AM By: Fredirick Maudlin MD FACS Entered By: Fredirick Maudlin on 03/30/2022 11:13:00 -------------------------------------------------------------------------------- Debridement Details Patient Name: Date of Service: [name] 03/30/2022 10:30 A M Medical Record Number: [id number] Patient Account Number: [id number] Date of Birth/Sex: Treating RN: 05/11/1951 (71 y.o. [name] Primary Care Provider: PA [name], NO Other Clinician: Referring Provider: Treating Provider/Extender: [name] in Treatment: 7 Debridement Performed for Assessment: Wound #2 Right,Lateral Foot Performed By: Physician Fredirick Maudlin, MD Debridement Type: Debridement Level of Consciousness (Pre-procedure): Awake and Alert Pre-procedure Verification/Time Out Yes - 11:02 Taken: Start Time: 11:07 Pain Control: Lidocaine 5% topical ointment T Area Debrided (L x W): otal 0.5 (cm) x 1 (cm) = 0.5 (cm) Tissue and other material debrided: Non-Viable, Eschar, Slough, Slough Level: Non-Viable Tissue Debridement Description: Selective/Open Wound Instrument: Curette Bleeding: Minimum Hemostasis Achieved: Pressure Procedural Pain: 0 Post Procedural Pain: 0 Response to Treatment: Procedure was tolerated well Level of Consciousness (Post- Awake and Alert procedure): Post Debridement Measurements of Total  Wound Length: (cm) 0.5 Width: (cm) 1 Depth: (cm) 0.1 Volume: (cm) 0.039 Character of Wound/Ulcer Post Debridement: Improved Post Procedure Diagnosis Same as Pre-procedure Electronic Signature(s) Signed: 03/30/2022 12:22:50 PM By: Fredirick Maudlin MD FACS Signed: 03/30/2022 4:56:45 PM By: Sharyn Creamer RN, BSN Entered By: Sharyn Creamer on 03/30/2022 11:09:03 -------------------------------------------------------------------------------- Debridement Details Patient Name: Date of Service: [name] 03/30/2022 10:30 A M Medical Record Number: [id number] Patient Account Number: [id number] Date of Birth/Sex: Treating RN: Sep 17, 1950 (71 y.o. [name] Primary Care Provider: PA [name], NO Other Clinician: Referring Provider: Treating Provider/Extender: [name] in Treatment: 7 Debridement Performed for Assessment: Wound #1 Left,Dorsal Foot Performed By: Physician Fredirick Maudlin, MD Debridement Type: Debridement Level of Consciousness (Pre-procedure): Awake and Alert Pre-procedure Verification/Time Out Yes - 11:02 Taken: Start Time: 11:07 Pain Control: Lidocaine 5% topical ointment T Area Debrided (L x W): otal 2.3 (cm) x 2.4 (cm) = 5.52 (cm) Tissue and other material debrided: Non-Viable, Slough, Subcutaneous, Slough Level: Skin/Subcutaneous Tissue Debridement Description: Excisional Instrument: Curette Bleeding: Minimum Hemostasis Achieved: Pressure Procedural Pain: 0 Post Procedural Pain: 0 Response to Treatment: Procedure was tolerated well Level of Consciousness (Post- Awake and Alert procedure): Post Debridement Measurements of Total Wound Length: (cm) 2.3 Width: (cm) 2.4 Depth: (cm) 0.3 Volume: (cm) 1.301 Character of Wound/Ulcer Post Debridement: Improved Post Procedure Diagnosis Same as Pre-procedure Electronic Signature(s) Signed: 03/30/2022 12:22:50 PM By: Fredirick Maudlin MD FACS Signed: 03/30/2022 4:56:45 PM By: Sharyn Creamer RN, BSN Entered By: Sharyn Creamer on 03/30/2022 11:09:50 -------------------------------------------------------------------------------- Debridement Details Patient Name: Date of Service: [name] 03/30/2022 10:30 A M Medical Record Number: [id number] Patient Account Number: [id number] Date of Birth/Sex: Treating RN: 11-19-1950 (71 y.o. [name] Primary Care Provider: PA [name], [place] Other Clinician: Referring Provider: Treating Provider/Extender: [name] in Treatment: 7 Debridement Performed for Assessment: Wound #3 Right,Medial Lower Leg Performed By: Physician Fredirick Maudlin, MD Debridement Type: Debridement Level of Consciousness (Pre-procedure): Awake and Alert Pre-procedure Verification/Time Out Yes - 11:02 Taken:  Start Time: 11:07 Pain Control: Lidocaine 5% topical ointment T Area Debrided (L x W): otal 2.3 (cm) x 3.6 (cm) = 8.28 (cm) Tissue and other material debrided: Non-Viable, Slough, Subcutaneous, [place], Other: calcium Level: Skin/Subcutaneous Tissue Debridement Description: Excisional Instrument: Curette Bleeding: Minimum Hemostasis Achieved: Pressure Procedural Pain: 0 Post Procedural Pain: 0 Response to Treatment: Procedure was tolerated well Level of Consciousness (Post- Awake and Alert procedure): Post Debridement Measurements of Total Wound Length: (cm) 2.3 Width: (cm) 3.6 Depth: (cm) 1 Volume: (cm) 6.503 Character of Wound/Ulcer Post Debridement: Improved Post Procedure Diagnosis Same as Pre-procedure Electronic Signature(s) Signed: 03/30/2022 12:22:50 PM By: Fredirick Maudlin MD FACS Signed: 03/30/2022 4:56:45 PM By: Sharyn Creamer RN, BSN Entered By: Sharyn Creamer on 03/30/2022 11:10:52 -------------------------------------------------------------------------------- HPI Details Patient Name: Date of Service: [name] 03/30/2022 10:30 A M Medical Record Number: [id number] Patient Account  Number: [id number] Date of Birth/Sex: Treating RN: 22-Apr-1951 (71 y.o. M) Primary Care Provider: PA [name], NO Other Clinician: Referring Provider: Treating Provider/Extender: [name], [name] in Treatment: 7 History of Present Illness HPI Description: ADMISSION 02/09/2022 This is a 71 year old man who recently moved to [place] from [place]. He has a history of of coronary artery disease and prior left popliteal vein DVT . He is not diabetic and does not smoke. 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 He has had multiple spinal surgeries and has limited mobility. He primarily uses a motorized wheelchair. He has had lymphedema for many years and does have lymphedema pumps although he says he does not use them frequently. He has wounds on his bilateral lower extremities. He was receiving care in [place] for these prior to his move. They were apparently packing his wounds with Betadine soaked gauze. He has worn compression wraps in the past but not recently. He did say that they helped tremendously. In clinic today, his right ABI is noncompressible but has good signals; the left ABI was 1.17. No formal vascular studies have been performed. On his left dorsal foot, there is a circular lesion that exposes the fat layer. There is fairly heavy slough accumulation within the wound, but no significant odor. On his right medial calf, he has multiple pinholes that have slough buildup in them and probe for about 2 to 4 mm in depth. They are draining clear fluid. On his right lateral midfoot, he has ulceration consistent with venous stasis. It is geographic and has slough accumulation. 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 02/17/2022: All of the wounds are little bit larger today and have more slough accumulation. Today, the medial calf openings are larger and probe to  something that feels calcified. There is odor coming from his wounds. His vascular studies are scheduled for August 9 and 10. 02-24-2022 upon evaluation today patient presents for follow-up concerning his ongoing issues here with his lower extremities. With that being said he does have significant problems with what appears to be cellulitis unfortunately the PCR culture that was sent last week got crushed by [employer] in transit. With that being said we are going to reobtain a culture today although I am actually to do it on the right medial leg as this seems to be the most inflamed area today where there is some questionable drainage as well. I am going to remove some of the calcium deposits which are loosening obtain a deeper culture from this area. Fortunately I  do not see any evidence of active infection systemically which is good news but locally I think we are having a bigger issue here. He does have his appointment next Thursday with vascular. Patient has also been referred to [facility] for further evaluation and treatment of the calcinosis for possible sulfate injections. 03/04/2022: The culture that was performed last week grew out multiple species including Klebsiella, Morganella, and Pseudomonas aeruginosa. He had been prescribed Bactrim but this was not adequate to cover all of the species and levofloxacin was recommended. He completed the Bactrim but did not pick up the levofloxacin and begin taking it until yesterday. 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 He had arterial studies performed today. He does have evidence of peripheral arterial disease. Results copied here: +-------+-----------+-----------+------------+------------+ ABI/TBIT oday's ABIT oday's TBIPrevious ABIPrevious  TBI +-------+-----------+-----------+------------+------------+ Right [place] 0.75    +-------+-----------+-----------+------------+------------+ Left [place] 0.57    +-------+-----------+-----------+------------+------------+ Arterial wall calcification precludes accurate ankle pressures and ABIs. Summary: Right: Resting right ankle-brachial index indicates noncompressible right lower extremity arteries. The right toe-brachial index is normal. Left: Resting left ankle-brachial index indicates noncompressible left lower extremity arteries. The left toe-brachial index is abnormal. He continues to have further tissue breakdown at the right medial calf and there is ample slough accumulation along with necrotic subcutaneous tissue. The left dorsal foot wound has built up slough, as well. The right medial foot wound is nearly closed with just a light layer of eschar over the surface. The right dorsal lateral foot wound has also accumulated a fair amount of slough and remains quite tender. 03/10/2022: He has been taking the prescribed levofloxacin and has about 3 days left of this. The erythema and induration on his right leg has improved. He continues to experience further breakdown of the right medial calf wound. There is extensive slough and debris accumulation there. There is heavy slough on his left dorsal foot wound, but no odor or purulent drainage. The right medial foot wound appears to be closed. The right dorsal lateral foot wound also has a fair amount of slough but it is less tender than at our last visit. He still does not have an appointment with dermatology. 03/22/2022: The right anterior tibial wound has closed. The right lateral foot wound is nearly closed with just a little bit of slough. The left dorsal foot wound is smaller and continues to have some slough buildup but less so than at prior visits. There is good granulation tissue underlying the slough. Unfortunately the right  medial calf wound continues to deteriorate. It has a foul odor today and a lot of necrotic tissue, the surrounding skin is also erythematous and moist. 03/30/2022: The right lateral foot wound continues to contract and just has a little bit of slough and eschar present. The left dorsal foot wound is also smaller with slough overlying good granulation tissue. The right medial calf wound actually looks quite a bit better today; there is no odor and there is much less necrotic tissue although some remains present. We have been using topical gentamicin and he has been taking oral Augmentin. Electronic Signature(s) Signed: 03/30/2022 11:14:20 AM By: Fredirick Maudlin MD FACS Entered By: Fredirick Maudlin on 03/30/2022 11:14:20 -------------------------------------------------------------------------------- Physical Exam Details Patient Name: Date of Service: [name], [name] 03/30/2022 10:30 A M Medical Record Number: [id number] Patient Account Number: [id number] Date of Birth/Sex: Treating RN:  [birth year] (71 y.o. M) Primary Care Provider: PA [name], NO Other Clinician: Referring Provider: Treating Provider/Extender: [name], [name] in Treatment: 7 Constitutional Hypertensive, asymptomatic. . . . No acute distress.Marland Kitchen Respiratory Normal work of breathing on room air.. Notes 03/30/2022: The right lateral foot wound continues to contract and just has a little bit of slough and eschar present. The left dorsal foot wound is also smaller with slough overlying good granulation tissue. The right medial calf wound actually looks quite a bit better today; there is no odor and there is much less necrotic tissue although some remains present. Electronic Signature(s) Signed: 03/30/2022 11:15:12 AM By: Fredirick Maudlin MD FACS Entered By: Fredirick Maudlin on 03/30/2022 11:15:12 -------------------------------------------------------------------------------- Physician Orders Details Patient Name: Date  of Service: [name] 03/30/2022 10:30 A M Medical Record Number: [id number] Patient Account Number: [id number] Date of Birth/Sex: Treating RN: 14-Jun-1951 (71 y.o. [name] Primary Care Provider: PA [name], NO Other Clinician: Referring Provider: Treating Provider/Extender: [name] in Treatment: 7 Verbal / Phone Orders: No Diagnosis Coding Follow-up Appointments ppointment in 1 week. - Dr. [name] Rm 1 Return A Wednesday 04/07/22 @ 1:15pm Nurse Visit: - 04/02/22 at 11:30 with Lovena Le Anesthetic Wound #1 Left,Dorsal Foot (In clinic) Topical Lidocaine 4% applied to wound bed Wound #2 Right,Lateral Foot (In clinic) Topical Lidocaine 4% applied to wound bed Wound #3 Right,Medial Lower Leg (In clinic) Topical Lidocaine 4% applied to wound bed Bathing/ Shower/ Hygiene May shower with protection but do not get wound dressing(s) wet. Edema Control - Lymphedema / SCD / Other Lymphedema Pumps. Use Lymphedema pumps on leg(s) 2-3 times a day for 45-60 minutes. If wearing any wraps or hose, do not remove them. Continue exercising as instructed. - use 1-2 times per day over wraps Elevate legs to the level of the heart or above for 30 minutes daily and/or when sitting, a frequency of: Exercise regularly Wound Treatment Wound #1 - Foot Wound Laterality: Dorsal, Left Peri-Wound Care: Triamcinolone 15 (g) 1 x Per Week Discharge Instructions: Use triamcinolone 15 (g) as directed Peri-Wound Care: Zinc Oxide Ointment 30g tube 1 x Per Week Discharge Instructions: Apply Zinc Oxide to periwound with each dressing change Peri-Wound Care: Sween Lotion (Moisturizing lotion) 1 x Per Week Discharge Instructions: Apply moisturizing lotion as directed Topical: Gentamicin 1 x Per Week Discharge Instructions: As directed by physician Prim Dressing: KerraCel Ag Gelling Fiber Dressing, 4x5 in (silver alginate) 1 x Per Week ary Discharge Instructions: Apply silver alginate to  wound bed as instructed Secondary Dressing: Woven Gauze Sponge, Non-Sterile 4x4 in 1 x Per Week Discharge Instructions: Apply over primary dressing as directed. Secondary Dressing: Zetuvit Plus 4x8 in 1 x Per Week Discharge Instructions: Apply over primary dressing as directed. Compression Wrap: ThreePress (3 layer compression wrap) 1 x Per Week Discharge Instructions: Apply three layer compression as directed. Wound #2 - Foot Wound Laterality: Right, Lateral Peri-Wound Care: Sween Lotion (Moisturizing lotion) 1 x Per Week Discharge Instructions: Apply moisturizing lotion as directed Topical: Gentamicin 1 x Per Week Discharge Instructions: As directed by physician Prim Dressing: KerraCel Ag Gelling Fiber Dressing, 4x5 in (silver alginate) 1 x Per Week ary Discharge Instructions: Apply silver alginate to wound bed as instructed Secondary Dressing: Woven Gauze Sponge, Non-Sterile 4x4 in 1 x Per Week Discharge Instructions: Apply over primary dressing as directed. Compression Wrap: ThreePress (3 layer compression wrap) 1 x Per Week Discharge Instructions: Apply three layer compression as directed. Wound #3 - Lower Leg Wound  Laterality: Right, Medial Peri-Wound Care: Ketoconazole Cream 2% 1 x Per Week Discharge Instructions: Apply Ketoconazole to periwoiund rash Peri-Wound Care: Triamcinolone 15 (g) 1 x Per Week Discharge Instructions: Use triamcinolone 15 (g) as directed Peri-Wound Care: Sween Lotion (Moisturizing lotion) 1 x Per Week Discharge Instructions: Apply moisturizing lotion as directed Topical: Gentamicin 1 x Per Week Discharge Instructions: As directed by physician Prim Dressing: KerraCel Ag Gelling Fiber Dressing, 4x5 in (silver alginate) 1 x Per Week ary Discharge Instructions: Apply silver alginate to wound bed as instructed Secondary Dressing: ABD Pad, 8x10 1 x Per Week Discharge Instructions: Apply over primary dressing as directed. Secondary Dressing: Woven Gauze  Sponge, Non-Sterile 4x4 in 1 x Per Week Discharge Instructions: Apply over primary dressing as directed. Compression Wrap: ThreePress (3 layer compression wrap) 1 x Per Week Discharge Instructions: Apply three layer compression as directed. Electronic Signature(s) Signed: 03/30/2022 12:22:50 PM By: Fredirick Maudlin MD FACS Signed: 03/30/2022 4:56:45 PM By: Sharyn Creamer RN, BSN Entered By: Sharyn Creamer on 03/30/2022 11:50:11 -------------------------------------------------------------------------------- Problem List Details Patient Name: Date of Service: [name], [name] 03/30/2022 10:30 A M Medical Record Number: [id number] Patient Account Number: [id number] Date of Birth/Sex: Treating RN: [birth year] (71 y.o. M) Primary Care Provider: PA [name], NO Other Clinician: Referring Provider: Treating Provider/Extender: [name] in Treatment: 7 Active Problems ICD-10 Encounter Code Description Active Date MDM Diagnosis L97.812 Non-pressure chronic ulcer of other part of right lower leg with fat layer 02/09/2022 No Yes exposed L97.522 Non-pressure chronic ulcer of other part of left foot with fat layer exposed 02/09/2022 No Yes 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) 02/09/2022 No Yes I10 Essential (primary) hypertension 02/09/2022 No Yes I25.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 02/09/2022 No Yes I89.0 Lymphedema, not elsewhere classified 02/09/2022 No Yes Z86.718 Personal history of other venous thrombosis and embolism 02/09/2022 No Yes L97.512 Non-pressure chronic ulcer of other part of right foot with fat layer exposed 02/17/2022 No Yes 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 M) Primary Care Provider: PA [name], NO Other Clinician: Referring Provider: Treating Provider/Extender: [name] in Treatment: 7 Subjective Chief Complaint Information obtained from Patient Patient seen for complaints of Non-Healing Wounds. History of Present Illness (HPI) ADMISSION 02/09/2022 This is a 71 year old man who recently moved to [place] from [place]. He has a history of of coronary artery disease and prior left popliteal vein DVT . He is not diabetic and does not smoke. 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 He has had multiple spinal surgeries and has limited mobility. He primarily uses a motorized wheelchair. He has had lymphedema for many years and does have lymphedema pumps although he says he does not use them frequently. He has wounds on his bilateral lower extremities. He was receiving care in [place] for these prior to his move. They were apparently packing his wounds with Betadine soaked gauze. He has worn compression wraps in the past but not recently. He did say that they helped tremendously. In clinic today, his right ABI is noncompressible but has good signals; the left ABI was 1.17. No formal vascular studies have been performed. On  his left dorsal foot, there is a circular lesion that exposes the fat layer. There is fairly heavy slough accumulation within the wound, but no significant odor. On his right medial calf, he has multiple pinholes that have slough buildup in them and probe for about 2 to 4 mm in depth. They are draining clear fluid. On his right lateral midfoot, he has ulceration consistent with venous stasis. It  is geographic and has slough accumulation. 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 02/17/2022: All of the wounds are little bit larger today and have more slough accumulation. Today, the medial calf openings are larger and probe to something that feels calcified. There is odor coming from his wounds. His vascular studies are scheduled for August 9 and 10. 02-24-2022 upon evaluation today patient presents for follow-up concerning his ongoing issues here with his lower extremities. With that being said he does have significant problems with what appears to be cellulitis unfortunately the PCR culture that was sent last week got crushed by [employer] in transit. With that being said we are going to reobtain a culture today although I am actually to do it on the right medial leg as this seems to be the most inflamed area today where there is some questionable drainage as well. I am going to remove some of the calcium deposits which are loosening obtain a deeper culture from this area. Fortunately I do not see any evidence of active infection systemically which is good news but locally I think we are having a bigger issue here. He does have his appointment next Thursday with vascular. Patient has also been referred to [facility] for further evaluation and treatment of the calcinosis for possible sulfate injections. 03/04/2022: The culture that was performed last week grew out multiple species including Klebsiella, Morganella, and Pseudomonas aeruginosa. He had been prescribed Bactrim but this was not adequate to cover all of the species and levofloxacin was recommended. He completed the Bactrim but did not pick up the levofloxacin and begin taking it until yesterday. 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 He had arterial studies performed today. He does have evidence of peripheral arterial disease. Results copied here: +-------+-----------+-----------+------------+------------+ ABI/TBIT oday's ABIT oday's TBIPrevious ABIPrevious TBI +-------+-----------+-----------+------------+------------+ Right [place] 0.75    +-------+-----------+-----------+------------+------------+ Left  0.57    +-------+-----------+-----------+------------+------------+ Arterial wall calcification precludes accurate ankle pressures and ABIs. Summary: Right: Resting right ankle-brachial index indicates noncompressible right lower extremity arteries. The right toe-brachial index is normal. Left: Resting left ankle-brachial index indicates noncompressible left lower extremity arteries. The left toe-brachial index is abnormal. He continues to have further tissue breakdown at the right medial calf and there is ample slough accumulation along with necrotic subcutaneous tissue. The left dorsal foot wound has built up slough, as well. The right medial foot wound is nearly closed with just a light layer of eschar over the surface. The right dorsal lateral foot wound has also accumulated a fair amount of slough and remains quite tender. 03/10/2022: He has been taking the prescribed levofloxacin and has about 3 days left of this. The erythema and induration on his right leg has improved. He continues to experience further breakdown of the right medial calf wound. There is extensive slough and debris accumulation there. There is heavy slough on his  left dorsal foot wound, but no odor or purulent drainage. The right medial foot wound appears to be closed. The right dorsal lateral foot wound also has a fair amount of slough but it is less tender than at our last visit. He still does not have an appointment with dermatology. 03/22/2022: The right anterior tibial wound  has closed. The right lateral foot wound is nearly closed with just a little bit of slough. The left dorsal foot wound is smaller and continues to have some slough buildup but less so than at prior visits. There is good granulation tissue underlying the slough. Unfortunately the right medial calf wound continues to deteriorate. It has a foul odor today and a lot of necrotic tissue, the surrounding skin is also erythematous and moist. 03/30/2022: The right lateral foot wound continues to contract and just has a little bit of slough and eschar present. The left dorsal foot wound is also smaller with slough overlying good granulation tissue. The right medial calf wound actually looks quite a bit better today; there is no odor and there is much less necrotic tissue although some remains present. We have been using topical gentamicin and he has been taking oral Augmentin. Patient History Information obtained from Patient, Chart. Family History Cancer - Mother, Heart Disease - Father,Mother, Hypertension - Father,Siblings, Lung Disease - Siblings, No family history of Diabetes, Hereditary Spherocytosis, Kidney Disease, Seizures, Stroke, Thyroid Problems, Tuberculosis. Social History Former smoker - quit 1991, Marital Status - Married, Alcohol Use - Moderate, Drug Use - No History, Caffeine Use - Daily - coffee. Medical History Eyes Patient has history of Cataracts - right eye removed Denies history of Glaucoma, Optic Neuritis Ear/Nose/Mouth/Throat Denies history of Chronic sinus problems/congestion, Middle ear problems Hematologic/Lymphatic Patient has history of Anemia - macrocytic, Lymphedema Cardiovascular Patient has history of Congestive Heart Failure, Deep Vein Thrombosis - left leg, Hypertension, Peripheral Venous Disease Endocrine Denies history of Type I Diabetes, Type II Diabetes Genitourinary Denies history of End Stage Renal Disease Integumentary (Skin) Denies history of History of  Burn Oncologic Denies history of Received Chemotherapy, Received Radiation Psychiatric Denies history of Anorexia/bulimia, Confinement Anxiety Hospitalization/Surgery History - cervical fusion. - lumbar surgery. - coronary stent placement. - lasik eye surgery. - hydrocele excision. Medical A Surgical History Notes nd Constitutional Symptoms (General Health) obesity Ear/Nose/Mouth/Throat hard of hearing Respiratory frozen diaphragm right Cardiovascular hyperlipidemia Musculoskeletal myositis, lumbar DDD, spinal stenosis, cervical facet joint syndrome Objective Constitutional Hypertensive, asymptomatic. No acute distress.. Vitals Time Taken: 10:40 AM, Temperature: 97.9 F, Pulse: 73 bpm, Respiratory Rate: 18 breaths/min, Blood Pressure: 164/84 mmHg. Respiratory Normal work of breathing on room air.. General Notes: 03/30/2022: The right lateral foot wound continues to contract and just has a little bit of slough and eschar present. The left dorsal foot wound is also smaller with slough overlying good granulation tissue. The right medial calf wound actually looks quite a bit better today; there is no odor and there is much less necrotic tissue although some remains present. Integumentary (Hair, Skin) Wound #1 status is Open. Original cause of wound was Gradually Appeared. The date acquired was: 12/07/2021. The wound has been in treatment 7 weeks. The wound is located on the Left,Dorsal Foot. The wound measures 2.3cm length x 2.4cm width x 0.3cm depth; 4.335cm^2 area and 1.301cm^3 volume. There is Fat Layer (Subcutaneous Tissue) exposed. There is no tunneling or undermining noted. There is a large amount of serosanguineous drainage noted. The wound margin is well defined and not attached to the  wound base. There is medium (34-66%) red, pink granulation within the wound bed. There is a medium (34-66%) amount of necrotic tissue within the wound bed including Adherent Slough. Wound #2 status  is Open. Original cause of wound was Gradually Appeared. The date acquired was: 11/02/2021. The wound has been in treatment 7 weeks. The wound is located on the Right,Lateral Foot. The wound measures 0.5cm length x 1cm width x 0.1cm depth; 0.393cm^2 area and 0.039cm^3 volume. There is Fat Layer (Subcutaneous Tissue) exposed. There is no tunneling or undermining noted. There is a small amount of serosanguineous drainage noted. The wound margin is flat and intact. There is no granulation within the wound bed. There is a large (67-100%) amount of necrotic tissue within the wound bed including Adherent Slough. Wound #3 status is Open. Original cause of wound was Gradually Appeared. The date acquired was: 12/07/2021. The wound has been in treatment 7 weeks. The wound is located on the Right,Medial Lower Leg. The wound measures 2.3cm length x 3.6cm width x 1cm depth; 6.503cm^2 area and 6.503cm^3 volume. There is Fat Layer (Subcutaneous Tissue) exposed. There is no tunneling or undermining noted. There is a large amount of purulent drainage noted. Foul odor after cleansing was noted. The wound margin is distinct with the outline attached to the wound base. There is medium (34-66%) red granulation within the wound bed. There is a medium (34-66%) amount of necrotic tissue within the wound bed including Adherent Slough. 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 There was a Excisional Skin/Subcutaneous Tissue Debridement with a total area of 5.52 sq cm performed by Fredirick Maudlin, MD. With the following instrument(s): Curette to remove Non-Viable tissue/material. Material removed includes Subcutaneous Tissue and Slough and after achieving pain control using Lidocaine 5% topical ointment. No specimens were taken. A time out was conducted at 11:02, prior to the start of the procedure. A Minimum amount of bleeding was controlled with Pressure. The procedure was tolerated well with a pain level of 0 throughout and a pain level of 0 following the procedure. Post Debridement Measurements: 2.3cm length x 2.4cm width x 0.3cm depth; 1.301cm^3 volume. Character of Wound/Ulcer Post Debridement is improved. Post procedure Diagnosis Wound #1: Same as Pre-Procedure Pre-procedure diagnosis of Wound #1 is a Lymphedema located on the Left,Dorsal Foot . There was a Three Layer Compression Therapy Procedure by Sharyn Creamer, RN. Post procedure Diagnosis Wound #1: Same as Pre-Procedure Wound #2 Pre-procedure diagnosis of Wound #2 is a Lymphedema located on the Right,Lateral Foot . There was a Selective/Open Wound Non-Viable Tissue Debridement with a total area of 0.5 sq cm performed by Fredirick Maudlin, MD. With the following instrument(s): Curette to remove Non-Viable tissue/material. Material removed includes Eschar and Slough and after achieving pain control using Lidocaine 5% topical ointment. No specimens were taken. A time out was conducted at 11:02, prior to the start of the procedure. A Minimum amount of bleeding was controlled with Pressure. The procedure was tolerated well with a pain level of 0  throughout and a pain level of 0 following the procedure. Post Debridement Measurements: 0.5cm length x 1cm width x 0.1cm depth; 0.039cm^3 volume. Character of Wound/Ulcer Post Debridement is improved. Post procedure  Diagnosis Wound #2: Same as Pre-Procedure Pre-procedure diagnosis of Wound #2 is a Lymphedema located on the Right,Lateral Foot . There was a Three Layer Compression Therapy Procedure by Sharyn Creamer, RN. Post procedure Diagnosis Wound #2: Same as Pre-Procedure Wound #3 Pre-procedure diagnosis of Wound #3 is a Lymphedema located on the Right,Medial Lower Leg . There was a Excisional Skin/Subcutaneous Tissue Debridement with a total area of 8.28 sq cm performed by Fredirick Maudlin, MD. With the following instrument(s): Curette to remove Non-Viable tissue/material. Material removed includes Subcutaneous Tissue, Slough, and Other: calcium after achieving pain control using Lidocaine 5% topical ointment. No specimens were taken. A time out was conducted at 11:02, prior to the start of the procedure. A Minimum amount of bleeding was controlled with Pressure. The procedure was tolerated well with a pain level of 0 throughout and a pain level of 0 following the procedure. Post Debridement Measurements: 2.3cm length x 3.6cm width x 1cm depth; 6.503cm^3 volume. Character of Wound/Ulcer Post Debridement is improved. Post procedure Diagnosis Wound #3: Same as Pre-Procedure Pre-procedure diagnosis of Wound #3 is a Lymphedema located on the Right,Medial Lower Leg . There was a Three Layer Compression Therapy Procedure by Sharyn Creamer, RN. Post procedure Diagnosis Wound #3: Same as Pre-Procedure Plan Follow-up Appointments: Return Appointment in 1 week. - Dr. [name] Rm 1 Tuesday 9/5 at 10:30 am Anesthetic: Wound #1 Left,Dorsal Foot: (In clinic) Topical Lidocaine 4% applied to wound bed Wound #2 Right,Lateral Foot: (In clinic) Topical Lidocaine 4% applied to wound bed Wound #3 Right,Medial Lower Leg: (In clinic) Topical Lidocaine 4% applied to wound bed Bathing/ Shower/ Hygiene: May shower with protection but do not get wound dressing(s) wet. Edema Control - Lymphedema / SCD / Other: Lymphedema  Pumps. Use Lymphedema pumps on leg(s) 2-3 times a day for 45-60 minutes. If wearing any wraps or hose, do not remove them. Continue exercising as instructed. - use 1-2 times per day over wraps Elevate legs to the level of the heart or above for 30 minutes daily and/or when sitting, a frequency of: Exercise regularly WOUND #1: - Foot Wound Laterality: Dorsal, Left Peri-Wound Care: Triamcinolone 15 (g) 1 x Per Week/ Discharge Instructions: Use triamcinolone 15 (g) as directed Peri-Wound Care: Zinc Oxide Ointment 30g tube 1 x Per Week/ Discharge Instructions: Apply Zinc Oxide to periwound with each dressing change Peri-Wound Care: Sween Lotion (Moisturizing lotion) 1 x Per Week/ Discharge Instructions: Apply moisturizing lotion as directed Topical: Gentamicin 1 x Per Week/ Discharge Instructions: As directed by physician Prim Dressing: KerraCel Ag Gelling Fiber Dressing, 4x5 in (silver alginate) 1 x Per Week/ ary Discharge Instructions: Apply silver alginate to wound bed as instructed Secondary Dressing: Woven Gauze Sponge, Non-Sterile 4x4 in 1 x Per Week/ Discharge Instructions: Apply over primary dressing as directed. Secondary Dressing: Zetuvit Plus 4x8 in 1 x Per Week/ Discharge Instructions: Apply over primary dressing as directed. Com pression Wrap: ThreePress (3 layer compression wrap) 1 x Per Week/ Discharge Instructions: Apply three layer compression as directed. WOUND #2: - Foot Wound Laterality: Right, Lateral Peri-Wound Care: Sween Lotion (Moisturizing lotion) 1 x Per Week/ Discharge Instructions: Apply moisturizing lotion as directed Topical: Gentamicin 1 x Per Week/ Discharge Instructions: As directed by physician Prim Dressing: KerraCel Ag Gelling Fiber Dressing, 4x5 in (silver alginate) 1 x Per Week/  ary Discharge Instructions: Apply silver alginate to wound bed as instructed Secondary Dressing: Woven Gauze Sponge, Non-Sterile 4x4 in 1 x Per Week/ Discharge Instructions:  Apply over primary dressing as directed. Com pression Wrap: ThreePress (3 layer compression wrap) 1 x Per Week/ Discharge Instructions: Apply three layer compression as directed. WOUND #3: - Lower Leg Wound Laterality: Right, Medial Peri-Wound Care: Ketoconazole Cream 2% 1 x Per Week/ Discharge Instructions: Apply Ketoconazole to periwoiund rash Peri-Wound Care: Triamcinolone 15 (g) 1 x Per Week/ Discharge Instructions: Use triamcinolone 15 (g) as directed Peri-Wound Care: Sween Lotion (Moisturizing lotion) 1 x Per Week/ Discharge Instructions: Apply moisturizing lotion as directed Topical: Gentamicin 1 x Per Week/ Discharge Instructions: As directed by physician Prim Dressing: KerraCel Ag Gelling Fiber Dressing, 4x5 in (silver alginate) 1 x Per Week/ ary Discharge Instructions: Apply silver alginate to wound bed as instructed Secondary Dressing: ABD Pad, 8x10 1 x Per Week/ Discharge Instructions: Apply over primary dressing as directed. Secondary Dressing: Woven Gauze Sponge, Non-Sterile 4x4 in 1 x Per Week/ Discharge Instructions: Apply over primary dressing as directed. Com pression Wrap: ThreePress (3 layer compression wrap) 1 x Per Week/ Discharge Instructions: Apply three layer compression as directed. 03/30/2022: The right lateral foot wound continues to contract and just has a little bit of slough and eschar present. The left dorsal foot wound is also smaller with slough overlying good granulation tissue. The right medial calf wound actually looks quite a bit better today; there is no odor and there is much less necrotic tissue although some remains present. I used a curette to debride slough and eschar from the right lateral foot wound, slough from the left dorsal foot wound, and slough and nonviable subcutaneous tissue (along with chunks of calcium) from the wound. Continue topical gentamicin with silver alginate and 3 layer compression. He will complete his course of oral  antibiotics. He has a dermatology appointment on Thursday of this week; I am hopeful that they will initiate sodium thiosulfate therapy for treatment of his calcinosis cutis. Electronic Signature(s) Signed: 03/30/2022 11:17:34 AM By: Fredirick Maudlin MD FACS Entered By: Fredirick Maudlin on 03/30/2022 11:17:34 -------------------------------------------------------------------------------- HxROS Details Patient Name: Date of Service: [name], [name] 03/30/2022 10:30 A M Medical Record Number: [id number] Patient Account Number: [id number] Date of Birth/Sex: Treating RN: [birth year] (71 y.o. M) Primary Care Provider: PA [name], NO Other Clinician: Referring Provider: Treating Provider/Extender: [name] in Treatment: 7 Information Obtained From Patient Chart Constitutional Symptoms (General Health) Medical History: Past Medical History Notes: obesity Eyes Medical History: Positive for: Cataracts - right eye removed Negative for: Glaucoma; Optic Neuritis Ear/Nose/Mouth/Throat Medical History: Negative for: Chronic sinus problems/congestion; Middle ear problems Past Medical History Notes: hard of hearing Hematologic/Lymphatic Medical History: Positive for: Anemia - macrocytic; Lymphedema Respiratory Medical History: Past Medical History Notes: frozen diaphragm right Cardiovascular Medical History: Positive for: Congestive Heart Failure; Deep Vein Thrombosis - left leg; Hypertension; Peripheral Venous Disease Past Medical History Notes: hyperlipidemia Endocrine Medical History: Negative for: Type I Diabetes; Type II Diabetes Genitourinary Medical History: Negative for: End Stage Renal Disease Integumentary (Skin) Medical History: Negative for: History of Burn Musculoskeletal Medical History: Past Medical History Notes: myositis, lumbar DDD, spinal stenosis, cervical facet joint syndrome Oncologic Medical History: Negative for: Received  Chemotherapy; Received Radiation Psychiatric Medical History: Negative for: Anorexia/bulimia; Confinement Anxiety HBO Extended History Items Eyes: Cataracts Immunizations Pneumococcal Vaccine: Received Pneumococcal Vaccination: Yes Received Pneumococcal Vaccination On or After 60th Birthday: Yes Implantable Devices No devices added Hospitalization /  Surgery History Type of Hospitalization/Surgery cervical fusion lumbar surgery coronary stent placement lasik eye surgery hydrocele excision Family and Social History Cancer: Yes - Mother; Diabetes: No; Heart Disease: Yes - Father,Mother; Hereditary Spherocytosis: No; Hypertension: Yes - Father,Siblings; Kidney Disease: No; Lung Disease: Yes - Siblings; Seizures: No; Stroke: No; Thyroid Problems: No; Tuberculosis: No; Former smoker - quit 1991; Marital Status - Married; Alcohol Use: Moderate; Drug Use: No History; Caffeine Use: Daily - coffee; Financial Concerns: No; Food, Clothing or Shelter Needs: No; Support System Lacking: No; Transportation Concerns: No Electronic Signature(s) Signed: 03/30/2022 12:22:50 PM By: Fredirick Maudlin MD FACS Entered By: Fredirick Maudlin on 03/30/2022 11:14:48 -------------------------------------------------------------------------------- SuperBill Details Patient Name: Date of Service: [name], [name] 03/30/2022 Medical Record Number: [id number] Patient Account Number: [id number] Date of Birth/Sex: Treating RN: 12-15-1950 (71 y.o. M) Primary Care Provider: PA [name], NO Other Clinician: Referring Provider: Treating Provider/Extender: [name], [name] in Treatment: 7 Diagnosis Coding ICD-10 Codes Code Description [phone] 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d L97.522 Non-pressure chronic ulcer of other part of left foot with fat layer exposed 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) I10 Essential (primary) hypertension I25.10  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 I89.0 Lymphedema, not elsewhere classified Z86.718 Personal 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 Facility Procedures CPT4 Code: [id number] Description: [facility] - DEB SUBQ TISSUE 20 SQ CM/< ICD-10 Diagnosis Description 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 Modifier: ed Quantity: 1 CPT4 Code: [id number] Description: [place] - DEBRIDE WOUND 1ST 20 SQ CM OR < ICD-10 Diagnosis Description L97.512 Non-pressure chronic ulcer of other part of right foot with fat layer exposed L97.522 Non-pressure chronic ulcer of other part of left foot with fat layer exposed Modifier: Quantity: 1 Physician Procedures : CPT4 Code Description Modifier [id number] - WC PHYS LEVEL 4 - EST PT 25 ICD-10 Diagnosis Description 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d L97.522 Non-pressure chronic ulcer of other part of left  foot with fat layer exposed Z86.718 Personal history of other venous thrombosis and embolism 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 Quantity: 1 : [id number] - WC PHYS SUBQ TISS 20 SQ CM ICD-10 Diagnosis Description 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d Quantity: 1 : [id number] - WC PHYS DEBR WO ANESTH 20 SQ CM ICD-10 Diagnosis Description L97.512 Non-pressure chronic ulcer of other part of right foot with fat layer exposed L97.522 Non-pressure chronic ulcer of other part of left foot with fat layer exposed Quantity: 1 Electronic Signature(s) Signed: 03/30/2022 11:18:06 AM By: Fredirick Maudlin MD FACS Entered By: Fredirick Maudlin on 03/30/2022 11:18:06

## 2022-04-01 DIAGNOSIS — L089 Local infection of the skin and subcutaneous tissue, unspecified: Secondary | ICD-10-CM | POA: Diagnosis not present

## 2022-04-01 DIAGNOSIS — I83009 Varicose veins of unspecified lower extremity with ulcer of unspecified site: Secondary | ICD-10-CM | POA: Diagnosis not present

## 2022-04-01 DIAGNOSIS — L97909 Non-pressure chronic ulcer of unspecified part of unspecified lower leg with unspecified severity: Secondary | ICD-10-CM | POA: Diagnosis not present

## 2022-04-02 ENCOUNTER — Telehealth: Payer: Self-pay | Admitting: Physician Assistant

## 2022-04-02 ENCOUNTER — Encounter (HOSPITAL_BASED_OUTPATIENT_CLINIC_OR_DEPARTMENT_OTHER): Payer: Medicare Other | Admitting: General Surgery

## 2022-04-02 ENCOUNTER — Other Ambulatory Visit: Payer: Self-pay | Admitting: *Deleted

## 2022-04-02 DIAGNOSIS — L97812 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d: Secondary | ICD-10-CM | POA: Diagnosis not present

## 2022-04-02 DIAGNOSIS — L97522 Non-pressure chronic ulcer of other part of left foot with fat layer exposed: Secondary | ICD-10-CM | POA: Diagnosis not present

## 2022-04-02 DIAGNOSIS — I872 Venous insufficiency (chronic) (peripheral): Secondary | ICD-10-CM | POA: Diagnosis not present

## 2022-04-02 DIAGNOSIS — I110 Hypertensive heart disease with heart failure: Secondary | ICD-10-CM | POA: Diagnosis not present

## 2022-04-02 DIAGNOSIS — L942 Calcinosis cutis: Secondary | ICD-10-CM | POA: Diagnosis not present

## 2022-04-02 DIAGNOSIS — R600 Localized edema: Secondary | ICD-10-CM

## 2022-04-02 DIAGNOSIS — L97512 Non-pressure chronic ulcer of other part of right foot with fat layer exposed: Secondary | ICD-10-CM | POA: Diagnosis not present

## 2022-04-02 NOTE — Progress Notes (Signed)
CARMERON, HEADY ([id number]) Visit Report for 04/02/2022 SuperBill Details Patient Name: Date of Service: [name], [name] 04/02/2022 Medical Record Number: [id number] Patient Account Number: [id number] Date of Birth/Sex: Treating RN: 1950/10/31 (71 y.o. Janyth Contes Primary Care Provider: PA [name], NO Other Clinician: Referring Provider: Treating Provider/Extender: [name] in Treatment: 7 Diagnosis Coding ICD-10 Codes Code Description [phone] 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d L97.522 Non-pressure chronic ulcer of other part of left foot with fat layer exposed 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) I10 Essential (primary) hypertension I25.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 I89.0 Lymphedema, not elsewhere classified Z86.718 Personal 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 Facility Procedures CPT4 Code Description Modifier Quantity [id number] (Facility Use Only) [id number] - [place] LWR RT LEG 1 ICD-10 Diagnosis Description 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d Electronic Signature(s) Signed: 04/02/2022 4:51:12 PM By: Fredirick Maudlin MD FACS Signed: 04/02/2022 5:20:05 PM By: Adline Peals Previous Signature: 04/02/2022 12:27:52 PM Version By: Fredirick Maudlin MD FACS Entered By: Adline Peals on 04/02/2022 14:42:31

## 2022-04-02 NOTE — Progress Notes (Signed)
[name], ENGELBERT ([id number]) Visit Report for 04/02/2022 Arrival Information Details Patient Name: Date of Service: [name], [name] 04/02/2022 11:30 A M Medical Record Number: [id number] Patient Account Number: [id number] Date of Birth/Sex: Treating RN: Nov 11, 1950 (71 y.o. [name] Primary Care Ayasha Ellingsen: PA [name], NO Other Clinician: Referring [name]: Treating [name]/Extender: [name] in Treatment: 7 Visit Information History Since Last Visit Added or deleted any medications: No Patient Arrived: Wheel Chair Any new allergies or adverse reactions: No Arrival Time: 11:24 Had a fall or experienced change in No Accompanied By: self activities of daily living that may affect Transfer Assistance: None risk of falls: Patient Requires Transmission-Based Precautions: No Signs or symptoms of abuse/neglect since last visito No Patient Has Alerts: Yes Hospitalized since last visit: No Patient Alerts: R ABI= N/C, TBI= .75 Implantable device outside of the clinic excluding No L ABI =N/C, TBI = .57 cellular tissue based products placed in the center since last visit: Has Dressing in Place as Prescribed: Yes Has Compression in Place as Prescribed: Yes Pain Present Now: No Electronic Signature(s) Signed: 04/02/2022 5:20:05 PM By: Adline Peals Entered By: Adline Peals on 04/02/2022 11:24:23 -------------------------------------------------------------------------------- Compression Therapy Details Patient Name: Date of Service: [name], [name] 04/02/2022 11:30 [place] Record Number: [id number] Patient Account Number: [id number] Date of Birth/Sex: Treating RN: [birth year] (71 y.o. [name] Primary Care [name]: PA [name], NO Other Clinician: Referring [name]: Treating [name]/Extender: [name] in Treatment: 7 Compression Therapy Performed for Wound Assessment: Wound #1 Left,Dorsal Foot Performed By:  Clinician Adline Peals, RN Compression Type: Three Layer Electronic Signature(s) Signed: 04/02/2022 5:20:05 PM By: Adline Peals Entered By: Adline Peals on 04/02/2022 11:25:51 -------------------------------------------------------------------------------- Compression Therapy Details Patient Name: Date of Service: [name], [name] 04/02/2022 11:30 A M Medical Record Number: [id number] Patient Account Number: [id number] Date of Birth/Sex: Treating RN: 03-17-1951 (71 y.o. [name] Primary Care [name]: PA [name], NO Other Clinician: Referring [name]: Treating Clarke Amburn/Extender: [name] in Treatment: 7 Compression Therapy Performed for Wound Assessment: Wound #3 Right,Medial Lower Leg Performed By: Clinician Adline Peals, RN Compression Type: Three Layer Electronic Signature(s) Signed: 04/02/2022 5:20:05 PM By: Adline Peals Entered By: Adline Peals on 04/02/2022 11:25:51 -------------------------------------------------------------------------------- Encounter Discharge Information Details Patient Name: Date of Service: [name], [name] 04/02/2022 11:30 A M Medical Record Number: [id number] Patient Account Number: [id number] Date of Birth/Sex: Treating RN: Mar 09, 1951 (71 y.o. [name] Primary Care Lovella Hardie: PA [name], NO Other Clinician: Referring Branden Vine: Treating [name]/Extender: [name] in Treatment: 7 Encounter Discharge Information Items Discharge Condition: Stable Ambulatory Status: Wheelchair Discharge Destination: Home Transportation: Private Auto Accompanied By: self Schedule Follow-up Appointment: Yes Clinical Summary of Care: Patient Declined Electronic Signature(s) Signed: 04/02/2022 5:20:05 PM By: [name] By: Adline Peals on 04/02/2022  11:26:17 -------------------------------------------------------------------------------- Patient/Caregiver Education Details Patient Name: Date of Service: [name], [name] 9/8/2023andnbsp11:30 A M Medical Record Number: [id number] Patient Account Number: [id number] Date of Birth/Gender: Treating RN: [birth year] (71 y.o. [name] Primary Care Physician: PA [name], [place] Other Clinician: Referring Physician: Treating Physician/Extender: [name] in Treatment: 7 Education Assessment Education Provided To: Patient Education Topics Provided Wound/Skin Impairment: Methods: Explain/Verbal Responses: Reinforcements needed, State content correctly Electronic Signature(s) Signed: 04/02/2022 5:20:05 PM By: Adline Peals Entered By: Adline Peals on 04/02/2022 11:26:06 -------------------------------------------------------------------------------- Wound Assessment Details Patient Name: Date of Service: [name], [name] 04/02/2022 11:30 A M Medical Record Number: [id number] Patient Account Number: [id number] Date  of Birth/Sex: Treating RN: 1950-10-15 (71 y.o. [name] Primary Care [name]: PA [name], NO Other Clinician: Referring [name]: Treating [name]/Extender: [name] in Treatment: 7 Wound Status Wound Number: 1 Primary Lymphedema Etiology: Wound Location: Left, Dorsal Foot Wound Open Wounding Event: Gradually Appeared Status: Date Acquired: 12/07/2021 Comorbid Cataracts, Anemia, Lymphedema, Congestive Heart Failure, Deep Weeks Of Treatment: 7 History: Vein Thrombosis, Hypertension, Peripheral Venous Disease Clustered Wound: No Wound Measurements Length: (cm) 2.3 Width: (cm) 2.4 Depth: (cm) 0.3 Area: (cm) 4.335 Volume: (cm) 1.301 % Reduction in Area: -14.3% % Reduction in Volume: 14.2% Epithelialization: Small (1-33%) Wound Description Classification: Full Thickness Without Exposed  Support Structures Wound Margin: Well defined, not attached Exudate Amount: Large Exudate Type: Serosanguineous Exudate Color: red, brown Foul Odor After Cleansing: No Slough/Fibrino Yes Wound Bed Granulation Amount: Medium (34-66%) Exposed Structure Granulation Quality: Red, Pink Fascia Exposed: No Necrotic Amount: Medium (34-66%) Fat Layer (Subcutaneous Tissue) Exposed: Yes Necrotic Quality: Adherent Slough Tendon Exposed: No Muscle Exposed: No Joint Exposed: No Bone Exposed: No Treatment Notes Wound #1 (Foot) Wound Laterality: Dorsal, Left Cleanser Peri-Wound Care Triamcinolone 15 (g) Discharge Instruction: Use triamcinolone 15 (g) as directed Zinc Oxide Ointment 30g tube Discharge Instruction: Apply Zinc Oxide to periwound with each dressing change Sween Lotion (Moisturizing lotion) Discharge Instruction: Apply moisturizing lotion as directed Topical Gentamicin Discharge Instruction: As directed by physician Primary Dressing KerraCel Ag Gelling Fiber Dressing, 4x5 in (silver alginate) Discharge Instruction: Apply silver alginate to wound bed as instructed Secondary Dressing Woven Gauze Sponge, Non-Sterile 4x4 in Discharge Instruction: Apply over primary dressing as directed. Zetuvit Plus 4x8 in Discharge Instruction: Apply over primary dressing as directed. Secured With Compression Wrap ThreePress (3 layer compression wrap) Discharge Instruction: Apply three layer compression as directed. Compression Stockings Add-Ons Electronic Signature(s) Signed: 04/02/2022 5:20:05 PM By: Adline Peals Entered By: Adline Peals on 04/02/2022 11:25:31 -------------------------------------------------------------------------------- Wound Assessment Details Patient Name: Date of Service: SALAAM, BATTERSHELL 04/02/2022 11:30 A M Medical Record Number: [id number] Patient Account Number: [id number] Date of Birth/Sex: Treating RN: [birth year] (71 y.o. [name] Primary Care Whitley Patchen: PA [name], NO Other Clinician: Referring Abhijay Morriss: Treating Avila Albritton/Extender: [name] in Treatment: 7 Wound Status Wound Number: 2 Primary Lymphedema Etiology: Wound Location: Right, Lateral Foot Wound Open Wounding Event: Gradually Appeared Status: Date Acquired: 11/02/2021 Comorbid Cataracts, Anemia, Lymphedema, Congestive Heart Failure, Deep Weeks Of Treatment: 7 History: Vein Thrombosis, Hypertension, Peripheral Venous Disease Clustered Wound: No Wound Measurements Length: (cm) 0.5 Width: (cm) 1 Depth: (cm) 0.1 Area: (cm) 0.393 Volume: (cm) 0.039 % Reduction in Area: 90.4% % Reduction in Volume: 90.4% Epithelialization: Medium (34-66%) Wound Description Classification: Full Thickness Without Exposed Support Structures Wound Margin: Flat and Intact Exudate Amount: Small Exudate Type: Serosanguineous Exudate Color: red, brown Foul Odor After Cleansing: No Slough/Fibrino Yes Wound Bed Granulation Amount: None Present (0%) Exposed Structure Necrotic Amount: Large (67-100%) Fascia Exposed: No Necrotic Quality: Adherent Slough Fat Layer (Subcutaneous Tissue) Exposed: Yes Tendon Exposed: No Muscle Exposed: No Joint Exposed: No Bone Exposed: No Treatment Notes Wound #2 (Foot) Wound Laterality: Right, Lateral Cleanser Peri-Wound Care Sween Lotion (Moisturizing lotion) Discharge Instruction: Apply moisturizing lotion as directed Topical Gentamicin Discharge Instruction: As directed by physician Primary Dressing KerraCel Ag Gelling Fiber Dressing, 4x5 in (silver alginate) Discharge Instruction: Apply silver alginate to wound bed as instructed Secondary Dressing Woven Gauze Sponge, Non-Sterile 4x4 in Discharge Instruction: Apply over primary dressing as directed. Secured With Compression Wrap ThreePress (3 layer compression wrap) Discharge  Instruction: Apply three layer compression as  directed. Compression Stockings Add-Ons Electronic Signature(s) Signed: 04/02/2022 5:20:05 PM By: Adline Peals Entered By: Adline Peals on 04/02/2022 11:25:35 -------------------------------------------------------------------------------- Wound Assessment Details Patient Name: Date of Service: [name], [name] 04/02/2022 11:30 A M Medical Record Number: [id number] Patient Account Number: [id number] Date of Birth/Sex: Treating RN: 10-05-1950 (71 y.o. [name] Primary Care Danila Eddie: PA [name], NO Other Clinician: Referring Witt Plitt: Treating Saralyn Willison/Extender: [name] in Treatment: 7 Wound Status Wound Number: 3 Primary Lymphedema Etiology: Wound Location: Right, Medial Lower Leg Wound Open Wounding Event: Gradually Appeared Status: Date Acquired: 12/07/2021 Comorbid Cataracts, Anemia, Lymphedema, Congestive Heart Failure, Deep Weeks Of Treatment: 7 History: Vein Thrombosis, Hypertension, Peripheral Venous Disease Clustered Wound: No Wound Measurements Length: (cm) 2.3 Width: (cm) 3.6 Depth: (cm) 1 Area: (cm) 6.503 Volume: (cm) 6.503 % Reduction in Area: -21.8% % Reduction in Volume: -204.4% Epithelialization: Small (1-33%) Wound Description Classification: Full Thickness With Exposed Support Structures Wound Margin: Distinct, outline attached Exudate Amount: Large Exudate Type: Purulent Exudate Color: yellow, brown, green Foul Odor After Cleansing: Yes Due to Product Use: No Slough/Fibrino Yes Wound Bed Granulation Amount: Medium (34-66%) Exposed Structure Granulation Quality: Red Fascia Exposed: No Necrotic Amount: Medium (34-66%) Fat Layer (Subcutaneous Tissue) Exposed: Yes Necrotic Quality: Adherent Slough Tendon Exposed: No Muscle Exposed: No Joint Exposed: No Bone Exposed: No Treatment Notes Wound #3 (Lower Leg) Wound Laterality: Right, Medial Cleanser Peri-Wound Care Ketoconazole Cream 2% Discharge  Instruction: Apply Ketoconazole to periwoiund rash Triamcinolone 15 (g) Discharge Instruction: Use triamcinolone 15 (g) as directed Sween Lotion (Moisturizing lotion) Discharge Instruction: Apply moisturizing lotion as directed Topical Gentamicin Discharge Instruction: As directed by physician Primary Dressing KerraCel Ag Gelling Fiber Dressing, 4x5 in (silver alginate) Discharge Instruction: Apply silver alginate to wound bed as instructed Secondary Dressing ABD Pad, 8x10 Discharge Instruction: Apply over primary dressing as directed. Woven Gauze Sponge, Non-Sterile 4x4 in Discharge Instruction: Apply over primary dressing as directed. Secured With Compression Wrap ThreePress (3 layer compression wrap) Discharge Instruction: Apply three layer compression as directed. Compression Stockings Add-Ons Electronic Signature(s) Signed: 04/02/2022 5:20:05 PM By: Adline Peals Entered By: Adline Peals on 04/02/2022 11:25:38 -------------------------------------------------------------------------------- Vitals Details Patient Name: Date of Service: [name] 04/02/2022 11:30 A M Medical Record Number: [id number] Patient Account Number: [id number] Date of Birth/Sex: Treating RN: [birth year] (71 y.o. [name] Primary Care Zonie Crutcher: PA [name], NO Other Clinician: Referring [name] Kimmons: Treating Keandra Medero/Extender: [name] in Treatment: 7 Vital Signs Time Taken: 11:48 Temperature (F): 97.8 Pulse (bpm): 60 Respiratory Rate (breaths/min): 18 Blood Pressure (mmHg): 163/81 Reference Range: 80 - 120 mg / dl Electronic Signature(s) Signed: 04/02/2022 5:20:05 PM By: Adline Peals Entered By: Adline Peals on 04/02/2022 11:51:27

## 2022-04-02 NOTE — Telephone Encounter (Signed)
I called [name] to review the abdominal US results from 03/25/2022. Findings showed mild fatty liver otherwise no other liver disease or splenomegaly. Most recent labs from 03/12/2022 showed stable anemia and thrombocytopenia with Hgb 12.5, Plt 115K. Reviewed options including proceeding with observation versus additional diagnostic studies such as bone marrow biopsy. Recommend to monitor for now as levels have been stable for the last 2-3 years. Patient will return in 6 months for repeat labs. He is aware to contact us if he has any worsening symptoms or blood counts worsen.   Patient expressed understanding of the plan provided.  

## 2022-04-04 NOTE — Progress Notes (Unsigned)
Cardiology Office Note   Date:  04/07/2022   ID:  [name], DOB 22-Apr-1951, MRN [id number]  PCP:  Patient, No Pcp Per  Cardiologist:   Dorris Carnes, MD    F?U of CAD     History of Present Illness: [name] is a 71 y.o. adult with a history of CAD   Last cardiac catheterization in Novembere 2015 showed:  a widely patent previously placed 4.018 mm bare-metal stent in the proximal LAD without restenosis in an otherwise normal LAD and circumflex system.  Dominant RCA with  99% stenosis proximal to the mid vessel .Normal LV function without focal wall motion abnormalities LVEF 60%. The patinet underwent PCI to the subtotal occlusion of the RCA with 99% stenosis being reduced to 0% with ultimate insertion of a Xience Alpine 4.018 mm DES stent post dilated to 4.49 mm. The pt also has a hx of HTN, HL and myositis     I saw the pt in July 2021   He was seen by Joellyn Rued in July 2023    The pt says he gets SOB with activity    He denies CP     No palpitations.  No dizziness. 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   [place] about 6 or 8 months ago    Hx of DVT while in [place]   On anticoagulant   He has been seen by hematology here   REocmm long  term anticoagulation as it was unproved.      Current Meds  Medication Sig   acetaminophen (TYLENOL) 500 MG tablet Take by mouth as needed for moderate pain or mild pain.   amoxicillin-clavulanate (AUGMENTIN) 875-125 MG tablet Take 1 tablet by mouth 2 (two) times daily.   apixaban (ELIQUIS) 2.5 MG TABS tablet Take 1 tablet (2.5 mg total) by mouth 2 (two) times daily.   atenolol (TENORMIN) 25 MG tablet Take 1 tablet (25 mg total) by mouth 2 (two) times daily.   b complex vitamins tablet Take 1 tablet by mouth daily.   betamethasone dipropionate (DIPROLENE) 0.05 % cream Apply 1 application topically every other day.   cholecalciferol (VITAMIN D) 1000 units tablet Take 1,000 Units by mouth daily.   Coenzyme Q10 (CO Q 10) 100 MG CAPS Take 100 mg by mouth daily.     diphenhydrAMINE (BENADRYL) 25 MG tablet Take 50 mg by mouth at bedtime.   ezetimibe (ZETIA) 10 MG tablet Take 1 tablet (10 mg total) by mouth daily.   folic acid (FOLVITE) 485 MCG tablet Take 800 mcg by mouth daily.    furosemide (LASIX) 40 MG tablet TAKE 1 TABLET BY MOUTH FOR 3 DAYS THEN TAKE ONLY AS NEEDED FOR EDEMA   glucosamine-chondroitin 500-400 MG tablet Take 2 tablets by mouth daily.    ibuprofen (ADVIL,MOTRIN) 800 MG tablet Take 800 mg by mouth daily.    Krill Oil 500 MG CAPS Take 500 mg by mouth daily.    lisinopril (ZESTRIL) 10 MG tablet Take 1 tablet (10 mg total) by mouth daily.   Melatonin 10 MG TABS Take 20 mg by mouth at bedtime.   potassium chloride (K-DUR) 10 MEQ tablet TAKE 1 TABLET BY MOUTH FOR 3 DAYS THEN TAKE ONLY AS NEEDED WHEN YOU TAKE A LASIX   rosuvastatin (CRESTOR) 5 MG tablet Take 1 tablet (5 mg total) by mouth daily.   Saw Palmetto, Serenoa repens, (SAW PALMETTO PO) Take 300 mg by mouth daily.   sulfamethoxazole-trimethoprim (BACTRIM DS) 800-160 MG tablet Take  1 tablet by mouth 2 (two) times daily.   triamcinolone cream (KENALOG) 0.1 % Apply 1 application topically as directed.     Allergies:   Patient has no known allergies.   Past Medical History:  Diagnosis Date   Adenomatous colon polyp - diminutive 06/2019   no recall needed   Coronary artery disease    a. s/p BMS to LAD (1998) b. s/p DES to RCA (06/24/14)   Dyspnea    HTN (hypertension)    Hyperlipidemia    Myositis    chronic CK elevation ( 415 CK, ALL MM CK subtype, seen by Rheumatology and considered normal Variant)   Palatal fistula    Palpable abd. aorta    Pneumonia    RBBB    Spinal stenosis    Dr Vaughan Sine  surgery   Weakness     Past Surgical History:  Procedure Laterality Date   CERVICAL FUSION  09/2009   CORONARY ANGIOPLASTY     CORONARY STENT PLACEMENT  06/24/2014   DES to RCA   [place] WITH CORONARY  ANGIOGRAM N/A 06/24/2014   Procedure: LEFT HEART CATHETERIZATION WITH CORONARY ANGIOGRAM;  Surgeon: Troy Sine, MD;  [facility] C[facility] LAB;  Service: Cardiovascular;  Laterality: N/A;   LUMBAR SPINE SURGERY  5/13   MUSCLE BIOPSY Left 07/11/2018   Procedure: Left Vastus lateralus muscle biopsy;  Surgeon: Judith Part, MD;  Location: [place];  Service: Neurosurgery;  Laterality: Left;  Left Vastus lateralus muscle biopsy   PERCUTANEOUS CORONARY STENT INTERVENTION (PCI-S)  06/24/2014   Procedure: PERCUTANEOUS CORONARY STENT INTERVENTION (PCI-S);  Surgeon: Troy Sine, MD;  Location: [facility] CATH LAB;  Service: Cardiovascular;;  Distal RCA     Social History:  The patient  reports that he quit smoking about 32 years ago. His smoking use included cigarettes. He has a 17.25 pack-year smoking history. He has never used smokeless tobacco. He reports current alcohol use. He reports that he does not use drugs.   Family History:  The patient's family history includes Aneurysm in his father; Cancer in his mother; Cystic fibrosis in his brother; Heart attack in his mother; Heart disease in his father.    ROS:  Please see the history of present illness. All other systems are reviewed and  Negative to the above problem except as noted.    PHYSICAL EXAM: VS:  BP (!) 150/100   Pulse 61   Ht '5\' 11"'$  (1.803 m)   Wt 281 lb 12.8 oz (127.8 kg)   SpO2 95%   BMI 39.30 kg/m    GEN: Morbidly obese 71 yo in NAD   HEENT: normal  Neck: JVP is not elevated  Cardiac: RRR; no murmurs,  1+ edema   Chronic stasis changes   Legs wrapped Respiratory:  clear to auscultation bilaterally, [id number]  Distended  Nontender  No definite masses  MS: Moving all extremities   Neuro   Deferred  Psych: euthymic mood, full affect   EKG:  EKG is not ordered today.        Lipid Panel    Component Value Date/Time   CHOL 116 12/31/2020 0856   TRIG 92 12/31/2020 0856   HDL 28 (L) 12/31/2020 0856   CHOLHDL 4.1  12/31/2020 0856   CHOLHDL 3.3 11/13/2015 1107   VLDL 26 11/13/2015 1107   LDLCALC 70 12/31/2020 0856   LDLDIRECT 65 05/16/2019 1400  Wt Readings from Last 3 Encounters:  04/05/22 281 lb 12.8 oz (127.8 kg)  03/03/22 280 lb 9.6 oz (127.3 kg)  01/29/22 284 lb (128.8 kg)      ASSESSMENT AND PLAN:  1  CAD  Patient denies CP   I am not  convinced of active symptoms   Will heck BMET and BNP     2  HL Will check lipomed  He has problems with statins  Curr on Zetia and Crestor 5       3  HTN  BP is mildly increased   Will check BMET   Consider increase lisinopril    4 Morbid obesity   Difficult wt loss given activity limitiations   5  Myopathy   Needs  to get follow up in [place]     F/U in clinic in 6 months     Current medicines are reviewed at length with the patient today.  The patient does not have concerns regarding medicines.  Signed, Dorris Carnes, MD  04/07/2022 9:26 PM    [address], [place],   [address] Phone: [phone]; Fax: [phone]

## 2022-04-05 ENCOUNTER — Encounter: Payer: Self-pay | Admitting: Internal Medicine

## 2022-04-05 ENCOUNTER — Ambulatory Visit: Payer: Medicare Other | Attending: Internal Medicine | Admitting: Internal Medicine

## 2022-04-05 VITALS — BP 150/100 | HR 61 | Ht 71.0 in | Wt 281.8 lb

## 2022-04-05 DIAGNOSIS — E785 Hyperlipidemia, unspecified: Secondary | ICD-10-CM | POA: Diagnosis not present

## 2022-04-05 DIAGNOSIS — R600 Localized edema: Secondary | ICD-10-CM | POA: Diagnosis not present

## 2022-04-05 DIAGNOSIS — Z79899 Other long term (current) drug therapy: Secondary | ICD-10-CM

## 2022-04-05 DIAGNOSIS — E782 Mixed hyperlipidemia: Secondary | ICD-10-CM

## 2022-04-05 DIAGNOSIS — I10 Essential (primary) hypertension: Secondary | ICD-10-CM

## 2022-04-05 DIAGNOSIS — I251 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: Secondary | ICD-10-CM | POA: Diagnosis not present

## 2022-04-05 NOTE — Patient Instructions (Addendum)
Medication Instructions:   *If you need a refill on your cardiac medications before your next appointment, please call your pharmacy*   Lab Work:  NMR, BMET, PRO BNP, HGBA1C If you have labs (blood work) drawn today and your tests are completely normal, you will receive your results only by: [place] (if you have MyChart) OR A paper copy in the mail If you have any lab test that is abnormal or we need to change your treatment, we will call you to review the results.   Testing/Procedures:    Follow-Up: At [facility], you and your health needs are our priority.  As part of our continuing mission to provide you with exceptional heart care, we have created designated Provider Care Teams.  These Care Teams include your primary Cardiologist (physician) and Advanced Practice Providers (APPs -  Physician Assistants and Nurse Practitioners) who all work together to provide you with the care you need, when you need it.  We recommend signing up for the patient portal called "MyChart".  Sign up information is provided on this After Visit Summary.  MyChart is used to connect with patients for Virtual Visits (Telemedicine).  Patients are able to view lab/test results, encounter notes, upcoming appointments, etc.  Non-urgent messages can be sent to your provider as well.   To learn more about what you can do with MyChart, go to [email or web address].    Your next appointment:   6 month(s)  The format for your next appointment:   In Person  Provider:   Dorris Carnes, MD     Other Instructions [place] [phone]   Important Information About Sugar

## 2022-04-06 LAB — PRO B NATRIURETIC PEPTIDE: NT-Pro BNP: 505 pg/mL — ABNORMAL HIGH (ref 0–376)

## 2022-04-06 LAB — NMR, LIPOPROFILE
Cholesterol, Total: 162 mg/dL (ref 100–199)
HDL Particle Number: 32.3 umol/L (ref >=30.5)
HDL-C: 59 mg/dL (ref >39)
LDL Particle Number: 1173 nmol/L — ABNORMAL HIGH (ref <1000)
LDL Size: 21 nm (ref >20.5)
LDL-C (NIH Calc): 87 mg/dL (ref 0–99)
LP-IR Score: <25 (ref <=45)
Small LDL Particle Number: 413 nmol/L (ref <=527)
Triglycerides: 83 mg/dL (ref 0–149)

## 2022-04-06 LAB — BASIC METABOLIC PANEL
BUN/Creatinine Ratio: 25 — ABNORMAL HIGH (ref 10–24)
BUN: 21 mg/dL (ref 8–27)
CO2: 24 mmol/L (ref 20–29)
Calcium: 9.3 mg/dL (ref 8.6–10.2)
Chloride: 102 mmol/L (ref 96–106)
Creatinine, Ser: 0.84 mg/dL (ref 0.76–1.27)
Glucose: 68 mg/dL — ABNORMAL LOW (ref 70–99)
Potassium: 4.7 mmol/L (ref 3.5–5.2)
Sodium: 139 mmol/L (ref 134–144)
eGFR: 92 mL/min/{1.73_m2} (ref >59)

## 2022-04-06 LAB — HEMOGLOBIN A1C
Est. average glucose Bld gHb Est-mCnc: 103 mg/dL
Hgb A1c MFr Bld: 5.2 % (ref 4.8–5.6)

## 2022-04-07 ENCOUNTER — Encounter: Payer: Self-pay | Admitting: Internal Medicine

## 2022-04-07 ENCOUNTER — Encounter (HOSPITAL_BASED_OUTPATIENT_CLINIC_OR_DEPARTMENT_OTHER): Payer: Medicare Other | Admitting: General Surgery

## 2022-04-07 DIAGNOSIS — I872 Venous insufficiency (chronic) (peripheral): Secondary | ICD-10-CM | POA: Diagnosis not present

## 2022-04-07 DIAGNOSIS — L97522 Non-pressure chronic ulcer of other part of left foot with fat layer exposed: Secondary | ICD-10-CM | POA: Diagnosis not present

## 2022-04-07 DIAGNOSIS — I110 Hypertensive heart disease with heart failure: Secondary | ICD-10-CM | POA: Diagnosis not present

## 2022-04-07 DIAGNOSIS — L942 Calcinosis cutis: Secondary | ICD-10-CM | POA: Diagnosis not present

## 2022-04-07 DIAGNOSIS — L97812 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d: Secondary | ICD-10-CM | POA: Diagnosis not present

## 2022-04-07 DIAGNOSIS — I890 Lymphedema, not elsewhere classified: Secondary | ICD-10-CM | POA: Diagnosis not present

## 2022-04-07 DIAGNOSIS — L97512 Non-pressure chronic ulcer of other part of right foot with fat layer exposed: Secondary | ICD-10-CM | POA: Diagnosis not present

## 2022-04-07 NOTE — Progress Notes (Signed)
[name], [name] ([id number]) Visit Report for 04/07/2022 Chief Complaint Document Details Patient Name: Date of Service: [name], [name] 04/07/2022 1:15 PM Medical Record Number: [id number] Patient Account Number: [id number] Date of Birth/Sex: Treating RN: 11-Apr-1951 (71 y.o. M) Primary Care Provider: PA [name], NO Other Clinician: Referring Provider: Treating Provider/Extender: [name] in Treatment: 8 Information Obtained from: Patient Chief Complaint Patient seen for complaints of Non-Healing Wounds. Electronic Signature(s) Signed: 04/07/2022 2:04:02 PM By: Fredirick Maudlin MD FACS Entered By: Fredirick Maudlin on 04/07/2022 14:04:02 -------------------------------------------------------------------------------- Debridement Details Patient Name: Date of Service: [name], [name] 04/07/2022 1:15 PM Medical Record Number: [id number] Patient Account Number: [id number] Date of Birth/Sex: Treating RN: [birth year] (71 y.o. [name] Primary Care Provider: PA [name], NO Other Clinician: Referring Provider: Treating Provider/Extender: [name] in Treatment: 8 Debridement Performed for Assessment: Wound #1 Left,Dorsal Foot Performed By: Physician Fredirick Maudlin, MD Debridement Type: Debridement Level of Consciousness (Pre-procedure): Awake and Alert Pre-procedure Verification/Time Out Yes - 13:30 Taken: Start Time: 13:32 Pain Control: Lidocaine 5% topical ointment T Area Debrided (L x W): otal 2.1 (cm) x 1.9 (cm) = 3.99 (cm) Tissue and other material debrided: Non-Viable, Slough, Subcutaneous, Slough Level: Skin/Subcutaneous Tissue Debridement Description: Excisional Instrument: Curette Bleeding: Minimum Hemostasis Achieved: Pressure Procedural Pain: 0 Post Procedural Pain: 0 Response to Treatment: Procedure was tolerated well Level of Consciousness (Post- Awake and Alert procedure): Post Debridement Measurements of Total  Wound Length: (cm) 2.1 Width: (cm) 1.9 Depth: (cm) 0.2 Volume: (cm) 0.627 Character of Wound/Ulcer Post Debridement: Improved Post Procedure Diagnosis Same as Pre-procedure Notes scribed for Dr [name] by Sharyn Creamer RN Electronic Signature(s) Signed: 04/07/2022 3:28:39 PM By: Fredirick Maudlin MD FACS Signed: 04/07/2022 3:40:40 PM By: Sharyn Creamer RN, BSN Previous Signature: 04/07/2022 2:52:30 PM Version By: Fredirick Maudlin MD FACS Entered By: Sharyn Creamer on 04/07/2022 15:07:13 -------------------------------------------------------------------------------- Debridement Details Patient Name: Date of Service: [name], [name] 04/07/2022 1:15 PM Medical Record Number: [id number] Patient Account Number: [id number] Date of Birth/Sex: Treating RN: 20-Nov-1950 (71 y.o. [name] Primary Care Provider: PA [name], NO Other Clinician: Referring Provider: Treating Provider/Extender: [name] in Treatment: 8 Debridement Performed for Assessment: Wound #3 Right,Medial Lower Leg Performed By: Physician Fredirick Maudlin, MD Debridement Type: Debridement Level of Consciousness (Pre-procedure): Awake and Alert Pre-procedure Verification/Time Out Yes - 13:30 Taken: Start Time: 13:32 Pain Control: Lidocaine 5% topical ointment T Area Debrided (L x W): otal 2.5 (cm) x 2 (cm) = 5 (cm) Tissue and other material debrided: Non-Viable, Slough, Subcutaneous, Slough Level: Skin/Subcutaneous Tissue Debridement Description: Excisional Instrument: Curette Bleeding: Minimum Hemostasis Achieved: Pressure Procedural Pain: 0 Post Procedural Pain: 0 Response to Treatment: Procedure was tolerated well Level of Consciousness (Post- Awake and Alert procedure): Post Debridement Measurements of Total Wound Length: (cm) 3 Width: (cm) 3.5 Depth: (cm) 1 Volume: (cm) 8.247 Character of Wound/Ulcer Post Debridement: Improved Post Procedure Diagnosis Same as  Pre-procedure Notes scribed for Dr [name] by Sharyn Creamer RN Electronic Signature(s) Signed: 04/07/2022 3:28:39 PM By: Fredirick Maudlin MD FACS Signed: 04/07/2022 3:40:40 PM By: Sharyn Creamer RN, BSN Previous Signature: 04/07/2022 2:52:30 PM Version By: Fredirick Maudlin MD FACS Entered By: Sharyn Creamer on 04/07/2022 15:07:44 -------------------------------------------------------------------------------- HPI Details Patient Name: Date of Service: [name], [name] 04/07/2022 1:15 PM Medical Record Number: [id number] Patient Account Number: [id number] Date of Birth/Sex: Treating RN: 24-May-1951 (71 y.o. M) Primary Care Provider: PA [name], NO Other Clinician: Referring Provider: Treating Provider/Extender: [name], [name] in  Treatment: 8 History of Present Illness HPI Description: ADMISSION 02/09/2022 This is a 71 year old man who recently moved to [place] from [place]. He has a history of of coronary artery disease and prior left popliteal vein DVT . He is not diabetic and does not smoke. 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 He has had multiple spinal surgeries and has limited mobility. He primarily uses a motorized wheelchair. He has had lymphedema for many years and does have lymphedema pumps although he says he does not use them frequently. He has wounds on his bilateral lower extremities. He was receiving care in [place] for these prior to his move. They were apparently packing his wounds with Betadine soaked gauze. He has worn compression wraps in the past but not recently. He did say that they helped tremendously. In clinic today, his right ABI is noncompressible but has good signals; the left ABI was 1.17. No formal vascular studies have been performed. On his left dorsal foot, there is a circular lesion that exposes the fat layer. There is fairly heavy slough accumulation within the wound, but no significant odor. On his right medial calf, he  has multiple pinholes that have slough buildup in them and probe for about 2 to 4 mm in depth. They are draining clear fluid. On his right lateral midfoot, he has ulceration consistent with venous stasis. It is geographic and has slough accumulation. 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 02/17/2022: All of the wounds are little bit larger today and have more slough accumulation. Today, the medial calf openings are larger and probe to something that feels calcified. There is odor coming from his wounds. His vascular studies are scheduled for August 9 and 10. 02-24-2022 upon evaluation today patient presents for follow-up concerning his ongoing issues here with his lower extremities. With that being said he does have significant problems with what appears to be cellulitis unfortunately the PCR culture that was sent last week got crushed by UPS in transit. With that being said we are going to reobtain a culture today although I am actually to do it on the right medial leg as this seems to be the most inflamed area today where there is some questionable drainage as well. I am going to remove some of the calcium deposits which are loosening obtain a deeper culture from this area. Fortunately I do not see any evidence of active infection systemically which is good news but locally I think we are having a bigger issue here. He does have his appointment next Thursday with vascular. Patient has also been referred to [facility] for further evaluation and treatment of the calcinosis for possible sulfate injections. 03/04/2022: The culture that was performed last week grew out multiple species including Klebsiella, Morganella, and Pseudomonas aeruginosa. He had been prescribed Bactrim but this was not adequate to cover all of the species and levofloxacin was recommended. He completed the Bactrim but did not pick up  the levofloxacin and begin taking it until yesterday. We referred him to dermatology at 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 He had arterial studies performed today. He does have evidence of peripheral arterial disease. Results copied here: +-------+-----------+-----------+------------+------------+ ABI/TBIT oday's ABIT oday's TBIPrevious ABIPrevious TBI +-------+-----------+-----------+------------+------------+ Right [place] 0.75    +-------+-----------+-----------+------------+------------+ Left [place] 0.57    +-------+-----------+-----------+------------+------------+  Arterial wall calcification precludes accurate ankle pressures and ABIs. Summary: Right: Resting right ankle-brachial index indicates noncompressible right lower extremity arteries. The right toe-brachial index is normal. Left: Resting left ankle-brachial index indicates noncompressible left lower extremity arteries. The left toe-brachial index is abnormal. He continues to have further tissue breakdown at the right medial calf and there is ample slough accumulation along with necrotic subcutaneous tissue. The left dorsal foot wound has built up slough, as well. The right medial foot wound is nearly closed with just a light layer of eschar over the surface. The right dorsal lateral foot wound has also accumulated a fair amount of slough and remains quite tender. 03/10/2022: He has been taking the prescribed levofloxacin and has about 3 days left of this. The erythema and induration on his right leg has improved. He continues to experience further breakdown of the right medial calf wound. There is extensive slough and debris accumulation there. There is heavy slough on his left dorsal foot wound, but no odor or purulent drainage. The right medial foot wound appears to be closed. The  right dorsal lateral foot wound also has a fair amount of slough but it is less tender than at our last visit. He still does not have an appointment with dermatology. 03/22/2022: The right anterior tibial wound has closed. The right lateral foot wound is nearly closed with just a little bit of slough. The left dorsal foot wound is smaller and continues to have some slough buildup but less so than at prior visits. There is good granulation tissue underlying the slough. Unfortunately the right medial calf wound continues to deteriorate. It has a foul odor today and a lot of necrotic tissue, the surrounding skin is also erythematous and moist. 03/30/2022: The right lateral foot wound continues to contract and just has a little bit of slough and eschar present. The left dorsal foot wound is also smaller with slough overlying good granulation tissue. The right medial calf wound actually looks quite a bit better today; there is no odor and there is much less necrotic tissue although some remains present. We have been using topical gentamicin and he has been taking oral Augmentin. 04/07/2022: The patient saw dermatology at [facility] last week. Unfortunately, it appears that they did not read any of the documentation that was provided. 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 Overall, it was a highly unsatisfactory consultation. In the meantime, however, we were able to identify compounding pharmacy who could create a topical sodium thiosulfate ointment. The patient has that with him today. Both of the right foot wounds are now closed. The left dorsal foot wound has contracted but still has a layer of slough on the surface. The medial calf  wounds on the right all look a little bit better. They still have heavy slough along with the chunks of calcium. Everything is stained purple. Electronic Signature(s) Signed: 04/07/2022 2:07:26 PM By: Fredirick Maudlin MD FACS Entered By: Fredirick Maudlin on 04/07/2022 14:07:26 -------------------------------------------------------------------------------- Physical Exam Details Patient Name: Date of Service: [name], [name] 04/07/2022 1:15 PM Medical Record Number: [id number] Patient Account Number: [id number] Date of Birth/Sex: Treating RN: February 15, 1951 (71 y.o. M) Primary Care Provider: PA [name], NO Other Clinician: Referring Provider: Treating Provider/Extender: [name],  [name] in Treatment: 8 Constitutional . . . . No acute distress.Marland Kitchen Respiratory Normal work of breathing on room air.. Notes 04/07/2022: Both of the right foot wounds are now closed. The left dorsal foot wound has contracted but still has a layer of slough on the surface. The medial calf wounds on the right all look a little bit better. They still have heavy slough along with the chunks of calcium. Everything is stained purple. Electronic Signature(s) Signed: 04/07/2022 2:08:51 PM By: Fredirick Maudlin MD FACS Entered By: Fredirick Maudlin on 04/07/2022 14:08:50 -------------------------------------------------------------------------------- Physician Orders Details Patient Name: Date of Service: [name], [name] 04/07/2022 1:15 PM Medical Record Number: [id number] Patient Account Number: [id number] Date of Birth/Sex: Treating RN: 12/10/1950 (72 y.o. [name] Primary Care Provider: PA [name], NO Other Clinician: Referring Provider: Treating Provider/Extender: [name] in Treatment: 8 Verbal / Phone Orders: No Diagnosis Coding ICD-10 Coding Code Description [phone] 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d L97.522 Non-pressure  chronic ulcer of other part of left foot with fat layer exposed 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) I10 Essential (primary) hypertension I25.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 I89.0 Lymphedema, not elsewhere classified Z86.718 Personal 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 - Dr. [name] Rm 1 Return A Anesthetic Wound #1 Left,Dorsal Foot (In clinic) Topical Lidocaine 4% applied to wound bed Wound #3 Right,Medial Lower Leg (In clinic) Topical Lidocaine 4% applied to wound bed Bathing/ Shower/ Hygiene May shower with protection but do not get wound dressing(s) wet. Edema Control - Lymphedema / SCD / Other Lymphedema Pumps. Use Lymphedema pumps on leg(s) 2-3 times a day for 45-60 minutes. If wearing any wraps or hose, do not remove them. Continue exercising as instructed. - use 1-2 times per day over wraps Elevate legs to the level of the heart or above for 30 minutes daily and/or when sitting, a frequency of: Exercise regularly Wound Treatment Wound #1 - Foot Wound Laterality: Dorsal, Left Peri-Wound Care: Triamcinolone 15 (g) 1 x Per Week Discharge Instructions: Use triamcinolone 15 (g) as directed Peri-Wound Care: Zinc Oxide Ointment 30g tube 1 x Per Week Discharge Instructions: Apply Zinc Oxide to periwound with each dressing change Peri-Wound Care: Sween Lotion (Moisturizing lotion) 1 x Per Week Discharge Instructions: Apply moisturizing lotion as directed Topical: Sodium Thiosulfate 1 x Per Week Prim Dressing: KerraCel Ag Gelling Fiber Dressing, 4x5 in (silver alginate) 1 x Per Week ary Discharge Instructions: Apply silver alginate to wound bed as instructed Secondary Dressing: Woven Gauze Sponge, Non-Sterile 4x4 in 1 x Per Week Discharge Instructions: Apply  over primary dressing as directed. Secondary Dressing: Zetuvit Plus 4x8 in 1 x Per Week Discharge Instructions: Apply over primary dressing as directed. Compression Wrap: ThreePress (3 layer compression wrap) 1 x Per Week Discharge Instructions: Apply three layer compression as directed. Wound #3 - Lower Leg Wound Laterality: Right, Medial Peri-Wound Care: Ketoconazole Cream 2% 1 x Per Week Discharge Instructions: Apply Ketoconazole to periwoiund rash Peri-Wound Care: Triamcinolone 15 (g) 1 x Per Week Discharge Instructions: Use triamcinolone 15 (g) as directed Peri-Wound Care: Sween Lotion (Moisturizing lotion) 1 x Per Week Discharge Instructions: Apply moisturizing lotion as directed Prim Dressing: KerraCel Ag Gelling Fiber Dressing, 4x5 in (silver alginate) 1 x Per Week ary Discharge Instructions: Apply silver alginate to  wound bed as instructed Secondary Dressing: ABD Pad, 8x10 1 x Per Week Discharge Instructions: Apply over primary dressing as directed. Secondary Dressing: Woven Gauze Sponge, Non-Sterile 4x4 in 1 x Per Week Discharge Instructions: Apply over primary dressing as directed. Compression Wrap: ThreePress (3 layer compression wrap) 1 x Per Week Discharge Instructions: Apply three layer compression as directed. Electronic Signature(s) Signed: 04/07/2022 2:52:30 PM By: Fredirick Maudlin MD FACS Entered By: Fredirick Maudlin on 04/07/2022 14:12:08 -------------------------------------------------------------------------------- Problem List Details Patient Name: Date of Service: TAMARIUS, ROSENFIELD 04/07/2022 1:15 PM Medical Record Number: [id number] Patient Account Number: [id number] Date of Birth/Sex: Treating RN: [birth year] (71 y.o. M) Primary Care Provider: PA [name], NO Other Clinician: Referring Provider: Treating Provider/Extender: [name] in Treatment: 8 Active Problems ICD-10 Encounter Code Description Active Date MDM Diagnosis L97.812  Non-pressure chronic ulcer of other part of right lower leg with fat layer 02/09/2022 No Yes exposed L97.522 Non-pressure chronic ulcer of other part of left foot with fat layer exposed 02/09/2022 No Yes 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) 02/09/2022 No Yes I10 Essential (primary) hypertension 02/09/2022 No Yes I25.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 02/09/2022 No Yes I89.0 Lymphedema, not elsewhere classified 02/09/2022 No Yes Z86.718 Personal history of other venous thrombosis and embolism 02/09/2022 No Yes L97.512 Non-pressure chronic ulcer of other part of right foot with fat layer exposed 02/17/2022 No Yes 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 02/09/2022 No Yes Inactive Problems Resolved Problems Electronic Signature(s) Signed: 04/07/2022 2:03:47 PM By: Fredirick Maudlin MD FACS Entered By: Fredirick Maudlin on 04/07/2022 14:03:47 -------------------------------------------------------------------------------- Progress Note Details Patient Name: Date of Service: [name], [name] 04/07/2022 1:15 PM Medical Record Number: [id number] Patient Account Number: [id number] Date of Birth/Sex: Treating RN: [birth year] (71 y.o. M) Primary Care Provider: PA [name], NO Other Clinician: Referring Provider: Treating Provider/Extender: [name] in Treatment: 8 Subjective Chief Complaint Information obtained from Patient Patient seen for complaints of Non-Healing Wounds. History of Present Illness (HPI) ADMISSION 02/09/2022 This is a 71 year old man who recently moved to [place] from [place]. He has a history of of coronary artery disease and prior left popliteal vein DVT . He is not diabetic and does not smoke. 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 He has had multiple spinal surgeries and has limited mobility. He primarily uses a motorized wheelchair. He has had lymphedema for many years and does have lymphedema  pumps although he says he does not use them frequently. He has wounds on his bilateral lower extremities. He was receiving care in [place] for these prior to his move. They were apparently packing his wounds with Betadine soaked gauze. He has worn compression wraps in the past but not recently. He did say that they helped tremendously. In clinic today, his right ABI is noncompressible but has good signals; the left ABI was 1.17. No formal vascular studies have been performed. On his left dorsal foot, there is a circular lesion that exposes the fat layer. There is fairly heavy slough accumulation within the wound, but no significant odor. On his right medial calf, he has multiple pinholes that have slough buildup in them and probe for about 2 to 4 mm in depth. They are draining clear fluid. On his right lateral midfoot, he has ulceration consistent with venous stasis. It is geographic and has slough accumulation. 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 02/17/2022: All  of the wounds are little bit larger today and have more slough accumulation. Today, the medial calf openings are larger and probe to something that feels calcified. There is odor coming from his wounds. His vascular studies are scheduled for August 9 and 10. 02-24-2022 upon evaluation today patient presents for follow-up concerning his ongoing issues here with his lower extremities. With that being said he does have significant problems with what appears to be cellulitis unfortunately the PCR culture that was sent last week got crushed by [employer] in transit. With that being said we are going to reobtain a culture today although I am actually to do it on the right medial leg as this seems to be the most inflamed area today where there is some questionable drainage as well. I am going to remove some of the calcium deposits which are loosening obtain a deeper  culture from this area. Fortunately I do not see any evidence of active infection systemically which is good news but locally I think we are having a bigger issue here. He does have his appointment next Thursday with vascular. Patient has also been referred to [facility] for further evaluation and treatment of the calcinosis for possible sulfate injections. 03/04/2022: The culture that was performed last week grew out multiple species including Klebsiella, Morganella, and Pseudomonas aeruginosa. He had been prescribed Bactrim but this was not adequate to cover all of the species and levofloxacin was recommended. He completed the Bactrim but did not pick up the levofloxacin and begin taking it until yesterday. 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 He had arterial studies performed today. He does have evidence of peripheral arterial disease. Results copied here: +-------+-----------+-----------+------------+------------+ ABI/TBIT oday's ABIT oday's TBIPrevious ABIPrevious TBI +-------+-----------+-----------+------------+------------+ Right [place] 0.75    +-------+-----------+-----------+------------+------------+ Left [place] 0.57    +-------+-----------+-----------+------------+------------+ Arterial wall calcification precludes accurate ankle pressures and ABIs. Summary: Right: Resting right ankle-brachial index indicates noncompressible right lower extremity arteries. The right toe-brachial index is normal. Left: Resting left ankle-brachial index indicates noncompressible left lower extremity arteries. The left toe-brachial index is abnormal. He continues to have further tissue breakdown at the right medial calf and there is ample slough accumulation along with necrotic subcutaneous tissue. The left dorsal foot  wound has built up slough, as well. The right medial foot wound is nearly closed with just a light layer of eschar over the surface. The right dorsal lateral foot wound has also accumulated a fair amount of slough and remains quite tender. 03/10/2022: He has been taking the prescribed levofloxacin and has about 3 days left of this. The erythema and induration on his right leg has improved. He continues to experience further breakdown of the right medial calf wound. There is extensive slough and debris accumulation there. There is heavy slough on his left dorsal foot wound, but no odor or purulent drainage. The right medial foot wound appears to be closed. The right dorsal lateral foot wound also has a fair amount of slough but it is less tender than at our last visit. He still does not have an appointment with dermatology. 03/22/2022: The right anterior tibial wound has closed. The right lateral foot wound is nearly closed with just a little bit of slough. The left dorsal foot wound is smaller and continues to have some slough buildup but less so than at prior visits. There is good  granulation tissue underlying the slough. Unfortunately the right medial calf wound continues to deteriorate. It has a foul odor today and a lot of necrotic tissue, the surrounding skin is also erythematous and moist. 03/30/2022: The right lateral foot wound continues to contract and just has a little bit of slough and eschar present. The left dorsal foot wound is also smaller with slough overlying good granulation tissue. The right medial calf wound actually looks quite a bit better today; there is no odor and there is much less necrotic tissue although some remains present. We have been using topical gentamicin and he has been taking oral Augmentin. 04/07/2022: The patient saw dermatology at [facility] last week. Unfortunately, it appears that they did not read any of the documentation that was provided.  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 Overall, it was a highly unsatisfactory consultation. In the meantime, however, we were able to identify compounding pharmacy who could create a topical sodium thiosulfate ointment. The patient has that with him today. Both of the right foot wounds are now closed. The left dorsal foot wound has contracted but still has a layer of slough on the surface. The medial calf wounds on the right all look a little bit better. They still have heavy slough along with the chunks of calcium. Everything is stained purple. Patient History Information obtained from Patient, Chart. Family History Cancer - Mother, Heart Disease - Father,Mother, Hypertension - Father,Siblings, Lung Disease - Siblings, No family history of Diabetes, Hereditary Spherocytosis, Kidney Disease, Seizures, Stroke, Thyroid Problems, Tuberculosis. Social History Former smoker - quit 1991, Marital Status - Married, Alcohol Use - Moderate, Drug Use - No History, Caffeine Use - Daily - coffee. Medical History Eyes Patient has history of Cataracts - right eye removed Denies history of Glaucoma, Optic Neuritis Ear/Nose/Mouth/Throat Denies history of Chronic sinus problems/congestion, Middle ear problems Hematologic/Lymphatic Patient has history of Anemia - macrocytic, Lymphedema Cardiovascular Patient has history of Congestive Heart Failure, Deep Vein Thrombosis - left leg, Hypertension, Peripheral Venous Disease Endocrine Denies history of Type I Diabetes, Type II Diabetes Genitourinary Denies history of End Stage Renal Disease Integumentary (Skin) Denies history of History of Burn Oncologic Denies history of Received Chemotherapy, Received  Radiation Psychiatric Denies history of Anorexia/bulimia, Confinement Anxiety Hospitalization/Surgery History - cervical fusion. - lumbar surgery. - coronary stent placement. - lasik eye surgery. - hydrocele excision. Medical A Surgical History Notes nd Constitutional Symptoms (General Health) obesity Ear/Nose/Mouth/Throat hard of hearing Respiratory frozen diaphragm right Cardiovascular hyperlipidemia Musculoskeletal myositis, lumbar DDD, spinal stenosis, cervical facet joint syndrome Objective Constitutional No acute distress.. Vitals Time Taken: 1:07 PM, Temperature: 98.2 F, Pulse: 68 bpm, Respiratory Rate: 18 breaths/min, Blood Pressure: 128/70 mmHg. Respiratory Normal work of breathing on room air.. General Notes: 04/07/2022: Both of the right foot wounds are now closed. The left dorsal foot wound has contracted but still has a layer of slough on the surface. The medial calf wounds on the right all look a little bit better. They still have heavy slough along with the chunks of calcium. Everything is stained purple. Integumentary (Hair, Skin) Wound #1 status is Open. Original cause of wound was Gradually Appeared. The date acquired was: 12/07/2021. The wound has been in treatment 8 weeks. The wound is  located on the Left,Dorsal Foot. The wound measures 2.1cm length x 1.9cm width x 0.2cm depth; 3.134cm^2 area and 0.627cm^3 volume. There is Fat Layer (Subcutaneous Tissue) exposed. There is no tunneling or undermining noted. There is a large amount of serosanguineous drainage noted. The wound margin is well defined and not attached to the wound base. There is medium (34-66%) red, pink granulation within the wound bed. There is a medium (34-66%) amount of necrotic tissue within the wound bed including Adherent Slough. Wound #2 status is Healed - Epithelialized. Original cause of wound was Gradually Appeared. The date acquired was: 11/02/2021. The wound has been in treatment 8 weeks.  The wound is located on the Right,Lateral Foot. The wound measures 0cm length x 0cm width x 0cm depth; 0cm^2 area and 0cm^3 volume. There is no tunneling or undermining noted. There is a none present amount of drainage noted. The wound margin is flat and intact. There is no granulation within the wound bed. There is no necrotic tissue within the wound bed. Wound #3 status is Open. Original cause of wound was Gradually Appeared. The date acquired was: 12/07/2021. The wound has been in treatment 8 weeks. The wound is located on the Right,Medial Lower Leg. The wound measures 3cm length x 3.5cm width x 1cm depth; 8.247cm^2 area and 8.247cm^3 volume. There is Fat Layer (Subcutaneous Tissue) exposed. There is no tunneling or undermining noted. There is a large amount of purulent drainage noted. Foul odor after cleansing was noted. The wound margin is distinct with the outline attached to the wound base. There is large (67-100%) red granulation within the wound bed. There is a small (1-33%) amount of necrotic tissue within the wound bed including Adherent Slough. 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 There was a Excisional Skin/Subcutaneous Tissue Debridement with a total area of 3.99 sq cm performed by Fredirick Maudlin, MD. With the following instrument(s): Curette to remove Non-Viable tissue/material.  Material removed includes Subcutaneous Tissue and Slough and after achieving pain control using Lidocaine 5% topical ointment. No specimens were taken. A time out was conducted at 13:30, prior to the start of the procedure. A Minimum amount of bleeding was controlled with Pressure. The procedure was tolerated well with a pain level of 0 throughout and a pain level of 0 following the procedure. Post Debridement Measurements: 2.1cm length x 1.9cm width x 0.2cm depth; 0.627cm^3 volume. Character of Wound/Ulcer Post Debridement is improved. Post procedure Diagnosis Wound #1: Same as Pre-Procedure General Notes: scribed for Dr [name] by Sharyn Creamer RN. Pre-procedure diagnosis of Wound #1 is a Lymphedema located on the Left,Dorsal Foot . There was a Three Layer Compression Therapy Procedure by Sharyn Creamer, RN. Post procedure Diagnosis Wound #1: Same as Pre-Procedure Wound #3 Pre-procedure diagnosis of Wound #3 is a Lymphedema located on the Right,Medial Lower Leg . There was a Excisional Skin/Subcutaneous Tissue Debridement with a total area of 5 sq cm performed by Fredirick Maudlin, MD. With the following instrument(s): Curette to remove Non-Viable tissue/material. Material  removed includes Subcutaneous Tissue and Slough and after achieving pain control using Lidocaine 5% topical ointment. No specimens were taken. A time out was conducted at 13:30, prior to the start of the procedure. A Minimum amount of bleeding was controlled with Pressure. The procedure was tolerated well with a pain level of 0 throughout and a pain level of 0 following the procedure. Post Debridement Measurements: 3cm length x 3.5cm width x 1cm depth; 8.247cm^3 volume. Character of Wound/Ulcer Post Debridement is improved. Post procedure Diagnosis Wound #3: Same as Pre-Procedure General Notes: scribed for Dr [name] by Sharyn Creamer RN. Pre-procedure diagnosis of Wound #3 is a Lymphedema located on the Right,Medial Lower Leg .  There was a Three Layer Compression Therapy Procedure by Sharyn Creamer, RN. Post procedure Diagnosis Wound #3: Same as Pre-Procedure Plan Follow-up Appointments: Return Appointment in 1 week. - Dr. [name] Rm 1 Anesthetic: Wound #1 Left,Dorsal Foot: (In clinic) Topical Lidocaine 4% applied to wound bed Wound #3 Right,Medial Lower Leg: (In clinic) Topical Lidocaine 4% applied to wound bed Bathing/ Shower/ Hygiene: May shower with protection but do not get wound dressing(s) wet. Edema Control - Lymphedema / SCD / Other: Lymphedema Pumps. Use Lymphedema pumps on leg(s) 2-3 times a day for 45-60 minutes. If wearing any wraps or hose, do not remove them. Continue exercising as instructed. - use 1-2 times per day over wraps Elevate legs to the level of the heart or above for 30 minutes daily and/or when sitting, a frequency of: Exercise regularly WOUND #1: - Foot Wound Laterality: Dorsal, Left Peri-Wound Care: Triamcinolone 15 (g) 1 x Per Week/ Discharge Instructions: Use triamcinolone 15 (g) as directed Peri-Wound Care: Zinc Oxide Ointment 30g tube 1 x Per Week/ Discharge Instructions: Apply Zinc Oxide to periwound with each dressing change Peri-Wound Care: Sween Lotion (Moisturizing lotion) 1 x Per Week/ Discharge Instructions: Apply moisturizing lotion as directed Topical: Sodium Thiosulfate 1 x Per Week/ Prim Dressing: KerraCel Ag Gelling Fiber Dressing, 4x5 in (silver alginate) 1 x Per Week/ ary Discharge Instructions: Apply silver alginate to wound bed as instructed Secondary Dressing: Woven Gauze Sponge, Non-Sterile 4x4 in 1 x Per Week/ Discharge Instructions: Apply over primary dressing as directed. Secondary Dressing: Zetuvit Plus 4x8 in 1 x Per Week/ Discharge Instructions: Apply over primary dressing as directed. Com pression Wrap: ThreePress (3 layer compression wrap) 1 x Per Week/ Discharge Instructions: Apply three layer compression as directed. WOUND #3: - Lower Leg Wound  Laterality: Right, Medial Peri-Wound Care: Ketoconazole Cream 2% 1 x Per Week/ Discharge Instructions: Apply Ketoconazole to periwoiund rash Peri-Wound Care: Triamcinolone 15 (g) 1 x Per Week/ Discharge Instructions: Use triamcinolone 15 (g) as directed Peri-Wound Care: Sween Lotion (Moisturizing lotion) 1 x Per Week/ Discharge Instructions: Apply moisturizing lotion as directed Prim Dressing: KerraCel Ag Gelling Fiber Dressing, 4x5 in (silver alginate) 1 x Per Week/ ary Discharge Instructions: Apply silver alginate to wound bed as instructed Secondary Dressing: ABD Pad, 8x10 1 x Per Week/ Discharge Instructions: Apply over primary dressing as directed. Secondary Dressing: Woven Gauze Sponge, Non-Sterile 4x4 in 1 x Per Week/ Discharge Instructions: Apply over primary dressing as directed. Com pression Wrap: ThreePress (3 layer compression wrap) 1 x Per Week/ Discharge Instructions: Apply three layer compression as directed. 04/07/2022: Both of the right foot wounds are now closed. The left dorsal foot wound has contracted but still has a layer of slough on the surface. The medial calf wounds on the right all look a little bit better. They still have  heavy slough along with the chunks of calcium. Everything is stained purple. I used a curette to debride slough from the left dorsal foot wound. I also debrided slough and calcium bits from the right medial calf wounds. We are going to apply the compounded topical sodium thiosulfate to the wound that is secondary to calcinosis cutis. A number of studies have demonstrated its efficacy with 20% of patients having complete resolution and somewhere around 50 to 60% having improvement. We may end up needing to resort to injectable sodium thiosulfate but for now we will try the topical compound. Continue silver alginate and 3 layer compression bilaterally. Follow-up in 1 week. Electronic Signature(s) Signed: 04/07/2022 3:28:39 PM By: Fredirick Maudlin MD  FACS Signed: 04/07/2022 3:40:40 PM By: Sharyn Creamer RN, BSN Previous Signature: 04/07/2022 2:15:40 PM Version By: Fredirick Maudlin MD FACS Entered By: Sharyn Creamer on 04/07/2022 15:08:03 -------------------------------------------------------------------------------- HxROS Details Patient Name: Date of Service: [name], [name] 04/07/2022 1:15 PM Medical Record Number: [id number] Patient Account Number: [id number] Date of Birth/Sex: Treating RN: 02/28/1951 (71 y.o. M) Primary Care Provider: PA [name], NO Other Clinician: Referring Provider: Treating Provider/Extender: [name] in Treatment: 8 Information Obtained From Patient Chart Constitutional Symptoms (General Health) Medical History: Past Medical History Notes: obesity Eyes Medical History: Positive for: Cataracts - right eye removed Negative for: Glaucoma; Optic Neuritis Ear/Nose/Mouth/Throat Medical History: Negative for: Chronic sinus problems/congestion; Middle ear problems Past Medical History Notes: hard of hearing Hematologic/Lymphatic Medical History: Positive for: Anemia - macrocytic; Lymphedema Respiratory Medical History: Past Medical History Notes: frozen diaphragm right Cardiovascular Medical History: Positive for: Congestive Heart Failure; Deep Vein Thrombosis - left leg; Hypertension; Peripheral Venous Disease Past Medical History Notes: hyperlipidemia Endocrine Medical History: Negative for: Type I Diabetes; Type II Diabetes Genitourinary Medical History: Negative for: End Stage Renal Disease Integumentary (Skin) Medical History: Negative for: History of Burn Musculoskeletal Medical History: Past Medical History Notes: myositis, lumbar DDD, spinal stenosis, cervical facet joint syndrome Oncologic Medical History: Negative for: Received Chemotherapy; Received Radiation Psychiatric Medical History: Negative for: Anorexia/bulimia; Confinement Anxiety HBO Extended  History Items Eyes: Cataracts Immunizations Pneumococcal Vaccine: Received Pneumococcal Vaccination: Yes Received Pneumococcal Vaccination On or After 60th Birthday: Yes Implantable Devices No devices added Hospitalization / Surgery History Type of Hospitalization/Surgery cervical fusion lumbar surgery coronary stent placement lasik eye surgery hydrocele excision Family and Social History Cancer: Yes - Mother; Diabetes: No; Heart Disease: Yes - Father,Mother; Hereditary Spherocytosis: No; Hypertension: Yes - Father,Siblings; Kidney Disease: No; Lung Disease: Yes - Siblings; Seizures: No; Stroke: No; Thyroid Problems: No; Tuberculosis: No; Former smoker - quit 1991; Marital Status - Married; Alcohol Use: Moderate; Drug Use: No History; Caffeine Use: Daily - coffee; Financial Concerns: No; Food, Clothing or Shelter Needs: No; Support System Lacking: No; Transportation Concerns: No Electronic Signature(s) Signed: 04/07/2022 2:52:30 PM By: Fredirick Maudlin MD FACS Entered By: Fredirick Maudlin on 04/07/2022 14:07:32 -------------------------------------------------------------------------------- SuperBill Details Patient Name: Date of Service: [name], [name] 04/07/2022 Medical Record Number: [id number] Patient Account Number: [id number] Date of Birth/Sex: Treating RN: May 16, 1951 (71 y.o. M) Primary Care Provider: PA [name], NO Other Clinician: Referring Provider: Treating Provider/Extender: [name], [name] in Treatment: 8 Diagnosis Coding ICD-10 Codes Code Description [phone] 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d L97.522 Non-pressure chronic ulcer of other part of left foot with fat layer exposed 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) I10 Essential (primary) hypertension I25.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  I89.0 Lymphedema, not elsewhere  classified Z86.718 Personal 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 Facility Procedures CPT4 Code: [id number] Description: 65537 - DEB SUBQ TISSUE 20 SQ CM/< ICD-10 Diagnosis Description L97.812 Non-pressure chronic ulcer of other part of right lower leg with fat layer exp L97.522 Non-pressure chronic ulcer of other part of left foot with fat layer exposed Modifier: osed Quantity: 1 Physician Procedures : CPT4 Code Description Modifier [id number] - WC PHYS LEVEL 4 - EST PT 25 ICD-10 Diagnosis Description M75.449 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d L97.522 Non-pressure chronic ulcer of other part of left  foot with fat layer exposed 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) Quantity: 1 : [id number] 11042 - WC PHYS SUBQ TISS 20 SQ CM ICD-10 Diagnosis Description Q19.758 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d L97.522 Non-pressure chronic ulcer of other part of left foot with fat layer exposed Quantity: 1 Electronic Signature(s) Signed: 04/07/2022 2:16:23 PM By: Fredirick Maudlin MD FACS Entered By: Fredirick Maudlin on 04/07/2022 14:16:23

## 2022-04-07 NOTE — Progress Notes (Signed)
MARGUIS, MATHIESON ([id number]) Visit Report for 04/07/2022 Arrival Information Details Patient Name: Date of Service: ADARSH, MUNDORF 04/07/2022 1:15 PM Medical Record Number: [id number] Patient Account Number: [id number] Date of Birth/Sex: Treating RN: 02-07-1951 (71 y.o. [name] Primary Care Lavilla Delamora: PA [name], [place] Other Clinician: Referring Kathya Wilz: Treating Caleyah Jr/Extender: [name] in Treatment: 8 Visit Information History Since Last Visit Added or deleted any medications: No Patient Arrived: Wheel Chair Any new allergies or adverse reactions: No Arrival Time: 13:07 Had a fall or experienced change in No Accompanied By: self activities of daily living that may affect Transfer Assistance: None risk of falls: Patient Identification Verified: Yes Signs or symptoms of abuse/neglect since last visito No Secondary Verification Process Completed: Yes Hospitalized since last visit: No Patient Requires Transmission-Based Precautions: No Implantable device outside of the clinic excluding No Patient Has Alerts: Yes cellular tissue based products placed in the center Patient Alerts: R ABI= N/C, TBI= .75 since last visit: L ABI =N/C, TBI = .57 Has Dressing in Place as Prescribed: Yes Has Compression in Place as Prescribed: Yes Pain Present Now: No Electronic Signature(s) Signed: 04/07/2022 3:40:40 PM By: Sharyn Creamer RN, BSN Entered By: Sharyn Creamer on 04/07/2022 13:07:36 -------------------------------------------------------------------------------- Compression Therapy Details Patient Name: Date of Service: JED, KUTCH 04/07/2022 1:15 PM Medical Record Number: [id number] Patient Account Number: [id number] Date of Birth/Sex: Treating RN: [birth year] (71 y.o. [name] Primary Care Kosisochukwu Burningham: PA [name], [place] Other Clinician: Referring Kyrra Prada: Treating Randie Tallarico/Extender: [name] in Treatment: 8 Compression Therapy  Performed for Wound Assessment: Wound #1 Left,Dorsal Foot Performed By: Clinician Sharyn Creamer, RN Compression Type: Three Layer Post Procedure Diagnosis Same as Pre-procedure Electronic Signature(s) Signed: 04/07/2022 3:40:40 PM By: Sharyn Creamer RN, BSN Entered By: Sharyn Creamer on 04/07/2022 13:34:53 -------------------------------------------------------------------------------- Compression Therapy Details Patient Name: Date of Service: IZEAH, VOSSLER 04/07/2022 1:15 PM Medical Record Number: [id number] Patient Account Number: [id number] Date of Birth/Sex: Treating RN: Jan 24, 1951 (71 y.o. [name] Primary Care Parrie Rasco: PA [name], [place] Other Clinician: Referring Annel Zunker: Treating Blaise Grieshaber/Extender: [name] in Treatment: 8 Compression Therapy Performed for Wound Assessment: Wound #3 Right,Medial Lower Leg Performed By: Clinician Sharyn Creamer, RN Compression Type: Three Layer Post Procedure Diagnosis Same as Pre-procedure Electronic Signature(s) Signed: 04/07/2022 3:40:40 PM By: Sharyn Creamer RN, BSN Entered By: Sharyn Creamer on 04/07/2022 13:34:53 -------------------------------------------------------------------------------- Encounter Discharge Information Details Patient Name: Date of Service: SAVYON, LOKEN 04/07/2022 1:15 PM Medical Record Number: [id number] Patient Account Number: [id number] Date of Birth/Sex: Treating RN: Dec 21, 1950 (71 y.o. [name] Primary Care Ishmail Mcmanamon: PA [name], [place] Other Clinician: Referring Brieann Osinski: Treating Viriginia Amendola/Extender: [name] in Treatment: 8 Encounter Discharge Information Items Post Procedure Vitals Discharge Condition: Stable Temperature (F): 98.2 Ambulatory Status: Wheelchair Pulse (bpm): 68 Discharge Destination: Home Respiratory Rate (breaths/min): 18 Transportation: Private Auto Blood Pressure (mmHg): 128/70 Accompanied By: self Schedule  Follow-up Appointment: Yes Clinical Summary of Care: Patient Declined Electronic Signature(s) Signed: 04/07/2022 3:40:40 PM By: Sharyn Creamer RN, BSN Entered By: Sharyn Creamer on 04/07/2022 15:29:06 -------------------------------------------------------------------------------- Lower Extremity Assessment Details Patient Name: Date of Service: TONNIE, FRIEDEL 04/07/2022 1:15 PM Medical Record Number: [id number] Patient Account Number: [id number] Date of Birth/Sex: Treating RN: [birth year] (71 y.o. [name] Primary Care Eyva Califano: PA [name], [place] Other Clinician: Referring Haaris Metallo: Treating Mileigh Tilley/Extender: Valeria Batman Weeks in Treatment: 8 Edema Assessment Assessed: [Left: No] [Right: No] Edema: [Left: Yes] [Right: Yes] Calf Left: Right: Point of  Measurement: From Medial Instep 40.5 cm 45 cm Ankle Left: Right: Point of Measurement: From Medial Instep 24.5 cm 26.2 cm Vascular Assessment Pulses: Dorsalis Pedis Palpable: [Left:Yes] [Right:Yes] Electronic Signature(s) Signed: 04/07/2022 3:40:40 PM By: Sharyn Creamer RN, BSN Entered By: Sharyn Creamer on 04/07/2022 13:18:44 -------------------------------------------------------------------------------- Multi Wound Chart Details Patient Name: Date of Service: EMIN, FOREE 04/07/2022 1:15 PM Medical Record Number: [id number] Patient Account Number: [id number] Date of Birth/Sex: Treating RN: 08-22-1950 (71 y.o. M) Primary Care Monee Dembeck: PA [name], [place] Other Clinician: Referring Fatimata Talsma: Treating Roderick Sweezy/Extender: [name], [name] in Treatment: 8 Vital Signs Height(in): Pulse(bpm): 56 Weight(lbs): Blood Pressure(mmHg): 128/70 Body Mass Index(BMI): Temperature(F): 98.2 Respiratory Rate(breaths/min): 18 Photos: Left, Dorsal Foot Right, Lateral Foot Right, Medial Lower Leg Wound Location: Gradually Appeared Gradually Appeared Gradually Appeared Wounding Event: Lymphedema  Lymphedema Lymphedema Primary Etiology: Cataracts, Anemia, Lymphedema, Cataracts, Anemia, Lymphedema, Cataracts, Anemia, Lymphedema, Comorbid History: Congestive Heart Failure, Deep Vein Congestive Heart Failure, Deep Vein Congestive Heart Failure, Deep Vein Thrombosis, Hypertension, Peripheral Thrombosis, Hypertension, Peripheral Thrombosis, Hypertension, Peripheral Venous Disease Venous Disease Venous Disease 12/07/2021 11/02/2021 12/07/2021 Date Acquired: '8 8 8 '$ Weeks of Treatment: Open Healed - Epithelialized Open Wound Status: No No No Wound Recurrence: 2.1x1.9x0.2 0x0x0 3x3.5x1 Measurements L x W x D (cm) 3.134 0 8.247 A (cm) : rea 0.627 0 8.247 Volume (cm) : 17.40% 100.00% -54.40% % Reduction in Area: 58.70% 100.00% -286.10% % Reduction in Volume: Full Thickness Without Exposed Full Thickness Without Exposed Full Thickness With Exposed Support Classification: Support Structures Support Structures Structures Large None Present Large Exudate Amount: Serosanguineous N/A Purulent Exudate Type: red, brown N/A yellow, brown, green Exudate Color: No No Yes Foul Odor A Cleansing: fter N/A N/A No Odor Anticipated Due to Product Use: Well defined, not attached Flat and Intact Distinct, outline attached Wound Margin: Medium (34-66%) None Present (0%) Large (67-100%) Granulation A mount: Red, Pink N/A Red Granulation Quality: Medium (34-66%) None Present (0%) Small (1-33%) Necrotic Amount: Fat Layer (Subcutaneous Tissue): Yes Fascia: No Fat Layer (Subcutaneous Tissue): Yes Exposed Structures: Fascia: No Fat Layer (Subcutaneous Tissue): No Fascia: No Tendon: No Tendon: No Tendon: No Muscle: No Muscle: No Muscle: No Joint: No Joint: No Joint: No Bone: No Bone: No Bone: No Small (1-33%) Large (67-100%) Small (1-33%) Epithelialization: Debridement - Excisional N/A Debridement - Excisional Debridement: Pre-procedure Verification/Time Out 13:30 N/A  13:30 Taken: Lidocaine 5% topical ointment N/A Lidocaine 5% topical ointment Pain Control: Subcutaneous, Slough N/A Subcutaneous, Slough Tissue Debrided: Skin/Subcutaneous Tissue N/A Skin/Subcutaneous Tissue Level: 3.99 N/A 5 Debridement A (sq cm): rea Curette N/A Curette Instrument: Minimum N/A Minimum Bleeding: Pressure N/A Pressure Hemostasis A chieved: 0 N/A 0 Procedural Pain: 0 N/A 0 Post Procedural Pain: Procedure was tolerated well N/A Procedure was tolerated well Debridement Treatment Response: 2.1x1.9x0.2 N/A 3x3.5x1 Post Debridement Measurements L x W x D (cm) 0.627 N/A 8.247 Post Debridement Volume: (cm) Compression Therapy N/A Compression Therapy Procedures Performed: Debridement Debridement Treatment Notes Electronic Signature(s) Signed: 04/07/2022 2:03:55 PM By: Fredirick Maudlin MD FACS Entered By: Fredirick Maudlin on 04/07/2022 14:03:55 -------------------------------------------------------------------------------- Multi-Disciplinary Care Plan Details Patient Name: Date of Service: ACHILLES, NEVILLE 04/07/2022 1:15 PM Medical Record Number: [id number] Patient Account Number: [id number] Date of Birth/Sex: Treating RN: 1950/09/26 (71 y.o. [name] Primary Care Tranika Scholler: PA [name], [place] Other Clinician: Referring Abaigeal Moomaw: Treating Celie Desrochers/Extender: [name] in Treatment: 8 Multidisciplinary Care Plan reviewed with physician Active Inactive Venous Leg Ulcer Nursing Diagnoses: Knowledge deficit related to disease process and management  Potential for venous Insuffiency (use before diagnosis confirmed) Goals: Patient will maintain optimal edema control Date Initiated: 02/17/2022 Target Resolution Date: 04/07/2022 Goal Status: Active Interventions: Assess peripheral edema status every visit. Compression as ordered Provide education on venous insufficiency Treatment Activities: Non-invasive vascular studies :  02/17/2022 Therapeutic compression applied : 02/17/2022 Notes: Wound/Skin Impairment Nursing Diagnoses: Impaired tissue integrity Knowledge deficit related to ulceration/compromised skin integrity Goals: Patient/caregiver will verbalize understanding of skin care regimen Date Initiated: 02/17/2022 Target Resolution Date: 04/07/2022 Goal Status: Active Ulcer/skin breakdown will have a volume reduction of 30% by week 4 Date Initiated: 02/17/2022 Date Inactivated: 03/10/2022 Target Resolution Date: 03/10/2022 Unmet Reason: infection being treated, Goal Status: Unmet waiting on derm appte Ulcer/skin breakdown will have a volume reduction of 50% by week 8 Date Initiated: 03/10/2022 Target Resolution Date: 04/07/2022 Goal Status: Active Interventions: Assess patient/caregiver ability to obtain necessary supplies Assess patient/caregiver ability to perform ulcer/skin care regimen upon admission and as needed Assess ulceration(s) every visit Provide education on ulcer and skin care Treatment Activities: Skin care regimen initiated : 02/17/2022 Topical wound management initiated : 02/17/2022 Notes: Electronic Signature(s) Signed: 04/07/2022 3:40:40 PM By: Sharyn Creamer RN, BSN Entered By: Sharyn Creamer on 04/07/2022 15:06:12 -------------------------------------------------------------------------------- Pain Assessment Details Patient Name: Date of Service: FLAVIO, LINDROTH 04/07/2022 1:15 PM Medical Record Number: [id number] Patient Account Number: [id number] Date of Birth/Sex: Treating RN: 1950-09-24 (71 y.o. [name] Primary Care Ferry Matthis: PA [name], [place] Other Clinician: Referring Adalay Azucena: Treating Estefanie Cornforth/Extender: [name] in Treatment: 8 Active Problems Location of Pain Severity and Description of Pain Patient Has Paino No Site Locations Pain Management and Medication Current Pain Management: Electronic Signature(s) Signed: 04/07/2022 3:40:40  PM By: Sharyn Creamer RN, BSN Entered By: Sharyn Creamer on 04/07/2022 13:09:00 -------------------------------------------------------------------------------- Patient/Caregiver Education Details Patient Name: Date of Service: Montee, [name] 9/13/2023andnbsp1:15 PM Medical Record Number: [id number] Patient Account Number: [id number] Date of Birth/Gender: Treating RN: 23-Mar-1951 (71 y.o. [name] Primary Care Physician: PA [name], [place] Other Clinician: Referring Physician: Treating Physician/Extender: [name] in Treatment: 8 Education Assessment Education Provided To: Patient Education Topics Provided Wound/Skin Impairment: Methods: Explain/Verbal Responses: State content correctly Electronic Signature(s) Signed: 04/07/2022 3:40:40 PM By: Sharyn Creamer RN, BSN Entered By: Sharyn Creamer on 04/07/2022 15:06:27 -------------------------------------------------------------------------------- Wound Assessment Details Patient Name: Date of Service: MYRL, LAZARUS 04/07/2022 1:15 PM Medical Record Number: [id number] Patient Account Number: [id number] Date of Birth/Sex: Treating RN: July 21, 1951 (71 y.o. [name] Primary Care Svea Pusch: PA [name], [place] Other Clinician: Referring Susane Bey: Treating Khamia Stambaugh/Extender: Valeria Batman Weeks in Treatment: 8 Wound Status Wound Number: 1 Primary Lymphedema Etiology: Wound Location: Left, Dorsal Foot Wound Open Wounding Event: Gradually Appeared Status: Date Acquired: 12/07/2021 Comorbid Cataracts, Anemia, Lymphedema, Congestive Heart Failure, Deep Weeks Of Treatment: 8 History: Vein Thrombosis, Hypertension, Peripheral Venous Disease Clustered Wound: No Photos Wound Measurements Length: (cm) 2.1 Width: (cm) 1.9 Depth: (cm) 0.2 Area: (cm) 3.134 Volume: (cm) 0.627 % Reduction in Area: 17.4% % Reduction in Volume: 58.7% Epithelialization: Small (1-33%) Tunneling:  No Undermining: No Wound Description Classification: Full Thickness Without Exposed Support Structures Wound Margin: Well defined, not attached Exudate Amount: Large Exudate Type: Serosanguineous Exudate Color: red, brown Foul Odor After Cleansing: No Slough/Fibrino Yes Wound Bed Granulation Amount: Medium (34-66%) Exposed Structure Granulation Quality: Red, Pink Fascia Exposed: No Necrotic Amount: Medium (34-66%) Fat Layer (Subcutaneous Tissue) Exposed: Yes Necrotic Quality: Adherent Slough Tendon Exposed: No Muscle Exposed: No Joint Exposed: No Bone Exposed: No  Treatment Notes Wound #1 (Foot) Wound Laterality: Dorsal, Left Cleanser Peri-Wound Care Triamcinolone 15 (g) Discharge Instruction: Use triamcinolone 15 (g) as directed Zinc Oxide Ointment 30g tube Discharge Instruction: Apply Zinc Oxide to periwound with each dressing change Sween Lotion (Moisturizing lotion) Discharge Instruction: Apply moisturizing lotion as directed Topical Sodium Thiosulfate Primary Dressing KerraCel Ag Gelling Fiber Dressing, 4x5 in (silver alginate) Discharge Instruction: Apply silver alginate to wound bed as instructed Secondary Dressing Woven Gauze Sponge, Non-Sterile 4x4 in Discharge Instruction: Apply over primary dressing as directed. Zetuvit Plus 4x8 in Discharge Instruction: Apply over primary dressing as directed. Secured With Compression Wrap ThreePress (3 layer compression wrap) Discharge Instruction: Apply three layer compression as directed. Compression Stockings Add-Ons Electronic Signature(s) Signed: 04/07/2022 3:40:40 PM By: Sharyn Creamer RN, BSN Entered By: Sharyn Creamer on 04/07/2022 13:24:11 -------------------------------------------------------------------------------- Wound Assessment Details Patient Name: Date of Service: MIKLOS, BIDINGER 04/07/2022 1:15 PM Medical Record Number: [id number] Patient Account Number: [id number] Date of Birth/Sex: Treating  RN: [birth year] (71 y.o. [name] Primary Care Kambryn Dapolito: PA [name], NO Other Clinician: Referring Zaydn Gutridge: Treating Folasade Mooty/Extender: Valeria Batman Weeks in Treatment: 8 Wound Status Wound Number: 2 Primary Lymphedema Etiology: Wound Location: Right, Lateral Foot Wound Healed - Epithelialized Wounding Event: Gradually Appeared Status: Date Acquired: 11/02/2021 Comorbid Cataracts, Anemia, Lymphedema, Congestive Heart Failure, Deep Weeks Of Treatment: 8 History: Vein Thrombosis, Hypertension, Peripheral Venous Disease Clustered Wound: No Photos Wound Measurements Length: (cm) Width: (cm) Depth: (cm) Area: (cm) Volume: (cm) 0 % Reduction in Area: 100% 0 % Reduction in Volume: 100% 0 Epithelialization: Large (67-100%) 0 Tunneling: No 0 Undermining: No Wound Description Classification: Full Thickness Without Exposed Support Structures Wound Margin: Flat and Intact Exudate Amount: None Present Foul Odor After Cleansing: No Slough/Fibrino No Wound Bed Granulation Amount: None Present (0%) Exposed Structure Necrotic Amount: None Present (0%) Fascia Exposed: No Fat Layer (Subcutaneous Tissue) Exposed: No Tendon Exposed: No Muscle Exposed: No Joint Exposed: No Bone Exposed: No Electronic Signature(s) Signed: 04/07/2022 3:40:40 PM By: Sharyn Creamer RN, BSN Entered By: Sharyn Creamer on 04/07/2022 13:35:57 -------------------------------------------------------------------------------- Wound Assessment Details Patient Name: Date of Service: TOWNES, FUHS 04/07/2022 1:15 PM Medical Record Number: [id number] Patient Account Number: [id number] Date of Birth/Sex: Treating RN: January 04, 1951 (71 y.o. [name] Primary Care Emilee Market: PA [name], [place] Other Clinician: Referring Yunus Stoklosa: Treating Lathan Gieselman/Extender: Valeria Batman Weeks in Treatment: 8 Wound Status Wound Number: 3 Primary Lymphedema Etiology: Wound Location: Right,  Medial Lower Leg Wound Open Wounding Event: Gradually Appeared Status: Date Acquired: 12/07/2021 Comorbid Cataracts, Anemia, Lymphedema, Congestive Heart Failure, Deep Weeks Of Treatment: 8 History: Vein Thrombosis, Hypertension, Peripheral Venous Disease Clustered Wound: No Photos Wound Measurements Length: (cm) 3 Width: (cm) 3.5 Depth: (cm) 1 Area: (cm) 8.247 Volume: (cm) 8.247 % Reduction in Area: -54.4% % Reduction in Volume: -286.1% Epithelialization: Small (1-33%) Tunneling: No Undermining: No Wound Description Classification: Full Thickness With Exposed Support Structures Wound Margin: Distinct, outline attached Exudate Amount: Large Exudate Type: Purulent Exudate Color: yellow, brown, green Foul Odor After Cleansing: Yes Due to Product Use: No Slough/Fibrino Yes Wound Bed Granulation Amount: Large (67-100%) Exposed Structure Granulation Quality: Red Fascia Exposed: No Necrotic Amount: Small (1-33%) Fat Layer (Subcutaneous Tissue) Exposed: Yes Necrotic Quality: Adherent Slough Tendon Exposed: No Muscle Exposed: No Joint Exposed: No Bone Exposed: No Treatment Notes Wound #3 (Lower Leg) Wound Laterality: Right, Medial Cleanser Peri-Wound Care Ketoconazole Cream 2% Discharge Instruction: Apply Ketoconazole to periwoiund rash Triamcinolone 15 (g) Discharge Instruction: Use triamcinolone 15 (g)  as directed Sween Lotion (Moisturizing lotion) Discharge Instruction: Apply moisturizing lotion as directed Topical Primary Dressing KerraCel Ag Gelling Fiber Dressing, 4x5 in (silver alginate) Discharge Instruction: Apply silver alginate to wound bed as instructed Secondary Dressing ABD Pad, 8x10 Discharge Instruction: Apply over primary dressing as directed. Woven Gauze Sponge, Non-Sterile 4x4 in Discharge Instruction: Apply over primary dressing as directed. Secured With Compression Wrap ThreePress (3 layer compression wrap) Discharge Instruction: Apply  three layer compression as directed. Compression Stockings Add-Ons Electronic Signature(s) Signed: 04/07/2022 3:40:40 PM By: Sharyn Creamer RN, BSN Entered By: Sharyn Creamer on 04/07/2022 13:24:33 -------------------------------------------------------------------------------- [place] Details Patient Name: Date of Service: RANALD, ALESSIO 04/07/2022 1:15 PM Medical Record Number: [id number] Patient Account Number: [id number] Date of Birth/Sex: Treating RN: 04-May-1951 (71 y.o. [name] Primary Care Mort Smelser: PA [name], [place] Other Clinician: Referring Tynia Wiers: Treating Ventura Hollenbeck/Extender: [name] in Treatment: 8 Vital Signs Time Taken: 13:07 Temperature (F): 98.2 Pulse (bpm): 68 Respiratory Rate (breaths/min): 18 Blood Pressure (mmHg): 128/70 Reference Range: 80 - 120 mg / dl Electronic Signature(s) Signed: 04/07/2022 3:40:40 PM By: Sharyn Creamer RN, BSN Entered By: Sharyn Creamer on 04/07/2022 13:08:52

## 2022-04-08 ENCOUNTER — Telehealth: Payer: Self-pay

## 2022-04-08 DIAGNOSIS — Z79899 Other long term (current) drug therapy: Secondary | ICD-10-CM

## 2022-04-08 DIAGNOSIS — E785 Hyperlipidemia, unspecified: Secondary | ICD-10-CM

## 2022-04-08 NOTE — Telephone Encounter (Signed)
Referral placed for the [facility].  

## 2022-04-08 NOTE — Telephone Encounter (Signed)
-----   Message from Dorris Carnes V, MD sent at 04/07/2022  2:36 PM EDT ----- Hgb A1C is good at 5.2 FLuid is up a little Electrolytes and glucose are OK LDL is 87   He is on Zetia and Crestor 5    Myositis limits statin use Can 1   Watch diet    2   refer for inclisiran Rx

## 2022-04-12 ENCOUNTER — Encounter (HOSPITAL_COMMUNITY): Payer: Medicare Other

## 2022-04-14 ENCOUNTER — Encounter (HOSPITAL_BASED_OUTPATIENT_CLINIC_OR_DEPARTMENT_OTHER): Payer: Medicare Other | Admitting: General Surgery

## 2022-04-14 ENCOUNTER — Encounter: Payer: Self-pay | Admitting: Internal Medicine

## 2022-04-14 DIAGNOSIS — I872 Venous insufficiency (chronic) (peripheral): Secondary | ICD-10-CM | POA: Diagnosis not present

## 2022-04-14 DIAGNOSIS — L942 Calcinosis cutis: Secondary | ICD-10-CM | POA: Diagnosis not present

## 2022-04-14 DIAGNOSIS — E785 Hyperlipidemia, unspecified: Secondary | ICD-10-CM

## 2022-04-14 DIAGNOSIS — Z79899 Other long term (current) drug therapy: Secondary | ICD-10-CM

## 2022-04-14 DIAGNOSIS — L97512 Non-pressure chronic ulcer of other part of right foot with fat layer exposed: Secondary | ICD-10-CM | POA: Diagnosis not present

## 2022-04-14 DIAGNOSIS — S81801A Unspecified open wound, right lower leg, initial encounter: Secondary | ICD-10-CM | POA: Diagnosis not present

## 2022-04-14 DIAGNOSIS — L97812 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d: Secondary | ICD-10-CM | POA: Diagnosis not present

## 2022-04-14 DIAGNOSIS — I110 Hypertensive heart disease with heart failure: Secondary | ICD-10-CM | POA: Diagnosis not present

## 2022-04-14 DIAGNOSIS — S91302A Unspecified open wound, left foot, initial encounter: Secondary | ICD-10-CM | POA: Diagnosis not present

## 2022-04-14 DIAGNOSIS — L97522 Non-pressure chronic ulcer of other part of left foot with fat layer exposed: Secondary | ICD-10-CM | POA: Diagnosis not present

## 2022-04-14 NOTE — Progress Notes (Signed)
GABRIELLA, GUILE ([id number]) Visit Report for 04/14/2022 Arrival Information Details Patient Name: Date of Service: MICHIEL, SIVLEY 04/14/2022 2:00 PM Medical Record Number: [id number] Patient Account Number: [id number] Date of Birth/Sex: Treating RN: [birth year] (71 y.o. [name] Primary Care Kaycie Pegues: PA [name], [place] Other Clinician: Referring Ashlei Chinchilla: Treating Chau Sawin/Extender: [name] in Treatment: 9 Visit Information History Since Last Visit Added or deleted any medications: No Patient Arrived: Wheel Chair Any new allergies or adverse reactions: No Arrival Time: 14:32 Had a fall or experienced change in No Accompanied By: self activities of daily living that may affect Transfer Assistance: None risk of falls: Patient Identification Verified: Yes Signs or symptoms of abuse/neglect since last visito No Secondary Verification Process Completed: Yes Hospitalized since last visit: No Patient Requires Transmission-Based Precautions: No Implantable device outside of the clinic excluding No Patient Has Alerts: Yes cellular tissue based products placed in the center Patient Alerts: R ABI= N/C, TBI= .75 since last visit: L ABI =N/C, TBI = .57 Has Dressing in Place as Prescribed: Yes Has Compression in Place as Prescribed: Yes Pain Present Now: Yes Electronic Signature(s) Signed: 04/14/2022 5:41:34 PM By: Baruch Gouty RN, BSN Entered By: Baruch Gouty on 04/14/2022 14:40:03 -------------------------------------------------------------------------------- Encounter Discharge Information Details Patient Name: Date of Service: [name] 04/14/2022 2:00 PM Medical Record Number: [id number] Patient Account Number: [id number] Date of Birth/Sex: Treating RN: [birth year] (71 y.o. [name] Primary Care Keeana Pieratt: PA [name], [place] Other Clinician: Referring Cyniah Gossard: Treating Cheyne Boulden/Extender: [name] in Treatment:  9 Encounter Discharge Information Items Post Procedure Vitals Discharge Condition: Stable Temperature (F): 98 Ambulatory Status: Wheelchair Pulse (bpm): 73 Discharge Destination: Home Respiratory Rate (breaths/min): 18 Transportation: Private Auto Blood Pressure (mmHg): 143/76 Accompanied By: self Schedule Follow-up Appointment: Yes Clinical Summary of Care: Patient Declined Electronic Signature(s) Signed: 04/14/2022 5:41:34 PM By: Baruch Gouty RN, BSN Entered By: Baruch Gouty on 04/14/2022 17:10:14 -------------------------------------------------------------------------------- Lower Extremity Assessment Details Patient Name: Date of Service: KAYTON, DUNAJ [name] 04/14/2022 2:00 PM Medical Record Number: [id number] Patient Account Number: [id number] Date of Birth/Sex: Treating RN: 1950-08-03 (71 y.o. [name] Primary Care Rizwan Kuyper: PA [name], [place] Other Clinician: Referring Michele Kerlin: Treating Jaydy Fitzhenry/Extender: Valeria Batman Weeks in Treatment: 9 Edema Assessment Assessed: [Left: No] [Right: No] Edema: [Left: Yes] [Right: Yes] Calf Left: Right: Point of Measurement: From Medial Instep 43 cm 44.5 cm Ankle Left: Right: Point of Measurement: From Medial Instep 24 cm 25.5 cm Vascular Assessment Pulses: Dorsalis Pedis Palpable: [Left:Yes] [Right:Yes] Electronic Signature(s) Signed: 04/14/2022 5:41:34 PM By: Baruch Gouty RN, BSN Entered By: Baruch Gouty on 04/14/2022 14:50:24 -------------------------------------------------------------------------------- Multi Wound Chart Details Patient Name: Date of Service: [name] 04/14/2022 2:00 PM Medical Record Number: [id number] Patient Account Number: [id number] Date of Birth/Sex: Treating RN: May 18, 1951 (71 y.o. M) Primary Care Taima Rada: PA [name], [place] Other Clinician: Referring Kadan Millstein: Treating Karrie Fluellen/Extender: [name], [name] in Treatment: 9 Vital  Signs Height(in): Pulse(bpm): 17 Weight(lbs): Blood Pressure(mmHg): 143/76 Body Mass Index(BMI): Temperature(F): 98.0 Respiratory Rate(breaths/min): 18 Photos: [N/A:N/A] Left, Dorsal Foot Right, Medial Lower Leg N/A Wound Location: Gradually Appeared Gradually Appeared N/A Wounding Event: Lymphedema Lymphedema N/A Primary Etiology: Cataracts, Anemia, Lymphedema, Cataracts, Anemia, Lymphedema, N/A Comorbid History: Congestive Heart Failure, Deep Vein Congestive Heart Failure, Deep Vein Thrombosis, Hypertension, Peripheral Thrombosis, Hypertension, Peripheral Venous Disease Venous Disease 12/07/2021 12/07/2021 N/A Date Acquired: 9 9 N/A Weeks of Treatment: Open Open N/A Wound Status: No No N/A Wound Recurrence: 1.9x2.1x0.2 2.5x4x1 N/A Measurements L x  W x D (cm) 3.134 7.854 N/A A (cm) : rea 0.627 7.854 N/A Volume (cm) : 17.40% -47.10% N/A % Reduction in A rea: 58.70% -267.70% N/A % Reduction in Volume: Full Thickness Without Exposed Full Thickness With Exposed Support N/A Classification: Support Structures Structures Large Medium N/A Exudate A mount: Serosanguineous Purulent N/A Exudate Type: red, brown yellow, brown, green N/A Exudate Color: Well defined, not attached Distinct, outline attached N/A Wound Margin: Medium (34-66%) Small (1-33%) N/A Granulation A mount: Red Red N/A Granulation Quality: Medium (34-66%) Large (67-100%) N/A Necrotic A mount: Fat Layer (Subcutaneous Tissue): Yes Fat Layer (Subcutaneous Tissue): Yes N/A Exposed Structures: Fascia: No Fascia: No Tendon: No Tendon: No Muscle: No Muscle: No Joint: No Joint: No Bone: No Bone: No Small (1-33%) Small (1-33%) N/A Epithelialization: Debridement - Selective/Open Wound Debridement - Excisional N/A Debridement: Pre-procedure Verification/Time Out 15:05 15:05 N/A Taken: Lidocaine 4% Topical Solution Lidocaine 4% Topical Solution N/A Pain Control: Slough Subcutaneous, Slough  N/A Tissue Debrided: Non-Viable Tissue Skin/Subcutaneous Tissue N/A Level: 3.99 10 N/A Debridement A (sq cm): rea Curette Curette N/A Instrument: Minimum Minimum N/A Bleeding: Pressure Pressure N/A Hemostasis A chieved: 2 2 N/A Procedural Pain: 1 1 N/A Post Procedural Pain: Procedure was tolerated well Procedure was tolerated well N/A Debridement Treatment Response: 1.9x2.1x0.2 2.5x4x1 N/A Post Debridement Measurements L x W x D (cm) 0.627 7.854 N/A Post Debridement Volume: (cm) Debridement Debridement N/A Procedures Performed: Treatment Notes Electronic Signature(s) Signed: 04/14/2022 3:16:31 PM By: Fredirick Maudlin MD FACS Entered By: Fredirick Maudlin on 04/14/2022 15:16:31 -------------------------------------------------------------------------------- Multi-Disciplinary Care Plan Details Patient Name: Date of Service: [name] 04/14/2022 2:00 PM Medical Record Number: [id number] Patient Account Number: [id number] Date of Birth/Sex: Treating RN: Oct 20, 1950 (71 y.o. [name] Primary Care Hisako Bugh: PA [name], [place] Other Clinician: Referring Alynah Schone: Treating Cali Hope/Extender: [name] in Treatment: 9 Multidisciplinary Care Plan reviewed with physician Active Inactive Venous Leg Ulcer Nursing Diagnoses: Knowledge deficit related to disease process and management Potential for venous Insuffiency (use before diagnosis confirmed) Goals: Patient will maintain optimal edema control Date Initiated: 02/17/2022 Target Resolution Date: 05/05/2022 Goal Status: Active Interventions: Assess peripheral edema status every visit. Compression as ordered Provide education on venous insufficiency Treatment Activities: Non-invasive vascular studies : 02/17/2022 Therapeutic compression applied : 02/17/2022 Notes: Wound/Skin Impairment Nursing Diagnoses: Impaired tissue integrity Knowledge deficit related to ulceration/compromised skin  integrity Goals: Patient/caregiver will verbalize understanding of skin care regimen Date Initiated: 02/17/2022 Target Resolution Date: 05/05/2022 Goal Status: Active Ulcer/skin breakdown will have a volume reduction of 30% by week 4 Date Initiated: 02/17/2022 Date Inactivated: 03/10/2022 Target Resolution Date: 03/10/2022 Unmet Reason: infection being treated, Goal Status: Unmet waiting on derm appte Ulcer/skin breakdown will have a volume reduction of 50% by week 8 Date Initiated: 03/10/2022 Date Inactivated: 04/14/2022 Target Resolution Date: 04/07/2022 Goal Status: Unmet Unmet Reason: calcinosis Ulcer/skin breakdown will have a volume reduction of 80% by week 12 Date Initiated: 04/14/2022 Target Resolution Date: 05/05/2022 Goal Status: Active Interventions: Assess patient/caregiver ability to obtain necessary supplies Assess patient/caregiver ability to perform ulcer/skin care regimen upon admission and as needed Assess ulceration(s) every visit Provide education on ulcer and skin care Treatment Activities: Skin care regimen initiated : 02/17/2022 Topical wound management initiated : 02/17/2022 Notes: Electronic Signature(s) Signed: 04/14/2022 5:41:34 PM By: Baruch Gouty RN, BSN Entered By: Baruch Gouty on 04/14/2022 15:01:00 -------------------------------------------------------------------------------- Pain Assessment Details Patient Name: Date of Service: [name] 04/14/2022 2:00 PM Medical Record Number: [id number] Patient Account Number: [id number] Date  of Birth/Sex: Treating RN: [birth year] (71 y.o. [name] Primary Care Zuriel Yeaman: PA [name], [place] Other Clinician: Referring Raymel Cull: Treating Apostolos Blagg/Extender: [name] in Treatment: 9 Active Problems Location of Pain Severity and Description of Pain Patient Has Paino Yes Site Locations Pain Location: Pain in Ulcers With Dressing Change: Yes Duration of the  Pain. Constant / Intermittento Intermittent Rate the pain. Current Pain Level: 3 Worst Pain Level: 4 Least Pain Level: 0 Character of Pain Describe the Pain: Aching Pain Management and Medication Current Pain Management: Medication: Yes Is the Current Pain Management Adequate: Adequate How does your wound impact your activities of daily livingo Sleep: No Bathing: No Appetite: No Relationship With Others: No Bladder Continence: No Emotions: No Bowel Continence: No Work: No Toileting: No Drive: No Dressing: No Hobbies: No Engineer, maintenance) Signed: 04/14/2022 5:41:34 PM By: Baruch Gouty RN, BSN Entered By: Baruch Gouty on 04/14/2022 14:41:07 -------------------------------------------------------------------------------- Patient/Caregiver Education Details Patient Name: Date of Service: Pettry, [name] 9/20/2023andnbsp2:00 PM Medical Record Number: [id number] Patient Account Number: [id number] Date of Birth/Gender: Treating RN: [birth year] (71 y.o. [name] Primary Care Physician: PA [name], [place] Other Clinician: Referring Physician: Treating Physician/Extender: [name] in Treatment: 9 Education Assessment Education Provided To: Patient Education Topics Provided Venous: Methods: Explain/Verbal Responses: Reinforcements needed, State content correctly Electronic Signature(s) Signed: 04/14/2022 5:41:34 PM By: Baruch Gouty RN, BSN Entered By: Baruch Gouty on 04/14/2022 15:04:05 -------------------------------------------------------------------------------- Wound Assessment Details Patient Name: Date of Service: [name] 04/14/2022 2:00 PM Medical Record Number: [id number] Patient Account Number: [id number] Date of Birth/Sex: Treating RN: June 23, 1951 (71 y.o. [name] Primary Care Antwan Pandya: PA [name], [place] Other Clinician: Referring Krithi Bray: Treating Agustine Rossitto/Extender: [name] in Treatment: 9 Wound Status Wound Number: 1 Primary Lymphedema Etiology: Wound Location: Left, Dorsal Foot Wound Open Wounding Event: Gradually Appeared Status: Date Acquired: 12/07/2021 Comorbid Cataracts, Anemia, Lymphedema, Congestive Heart Failure, Deep Weeks Of Treatment: 9 History: Vein Thrombosis, Hypertension, Peripheral Venous Disease Clustered Wound: No Photos Wound Measurements Length: (cm) 1.9 Width: (cm) 2.1 Depth: (cm) 0.2 Area: (cm) 3.134 Volume: (cm) 0.627 % Reduction in Area: 17.4% % Reduction in Volume: 58.7% Epithelialization: Small (1-33%) Tunneling: No Undermining: No Wound Description Classification: Full Thickness Without Exposed Support Structures Wound Margin: Well defined, not attached Exudate Amount: Large Exudate Type: Serosanguineous Exudate Color: red, brown Foul Odor After Cleansing: No Slough/Fibrino Yes Wound Bed Granulation Amount: Medium (34-66%) Exposed Structure Granulation Quality: Red Fascia Exposed: No Necrotic Amount: Medium (34-66%) Fat Layer (Subcutaneous Tissue) Exposed: Yes Necrotic Quality: Adherent Slough Tendon Exposed: No Muscle Exposed: No Joint Exposed: No Bone Exposed: No Treatment Notes Wound #1 (Foot) Wound Laterality: Dorsal, Left Cleanser Peri-Wound Care Triamcinolone 15 (g) Discharge Instruction: Use triamcinolone 15 (g) as directed Zinc Oxide Ointment 30g tube Discharge Instruction: Apply Zinc Oxide to periwound with each dressing change Sween Lotion (Moisturizing lotion) Discharge Instruction: Apply moisturizing lotion as directed Topical Sodium Thiosulfate Primary Dressing KerraCel Ag Gelling Fiber Dressing, 4x5 in (silver alginate) Discharge Instruction: Apply silver alginate to wound bed as instructed Secondary Dressing Woven Gauze Sponge, Non-Sterile 4x4 in Discharge Instruction: Apply over primary dressing as directed. Zetuvit Plus 4x8 in Discharge Instruction: Apply over  primary dressing as directed. Secured With Compression Wrap ThreePress (3 layer compression wrap) Discharge Instruction: Apply three layer compression as directed. Compression Stockings Add-Ons Electronic Signature(s) Signed: 04/14/2022 5:41:34 PM By: Baruch Gouty RN, BSN Entered By: Baruch Gouty on 04/14/2022 15:02:13 -------------------------------------------------------------------------------- Wound Assessment Details Patient  Name: Date of Service: THURMAN, SARVER 04/14/2022 2:00 PM Medical Record Number: [id number] Patient Account Number: [id number] Date of Birth/Sex: Treating RN: 02-Jan-1951 (71 y.o. [name] Primary Care Lemonte Al: PA [name], [place] Other Clinician: Referring Nekisha Mcdiarmid: Treating Brea Coleson/Extender: [name] in Treatment: 9 Wound Status Wound Number: 3 Primary Lymphedema Etiology: Wound Location: Right, Medial Lower Leg Wound Open Wounding Event: Gradually Appeared Status: Date Acquired: 12/07/2021 Comorbid Cataracts, Anemia, Lymphedema, Congestive Heart Failure, Deep Weeks Of Treatment: 9 History: Vein Thrombosis, Hypertension, Peripheral Venous Disease Clustered Wound: No Photos Wound Measurements Length: (cm) 2.5 Width: (cm) 4 Depth: (cm) 1 Area: (cm) 7.854 Volume: (cm) 7.854 Wound Description Classification: Full Thickness With Exposed Support Struct Wound Margin: Distinct, outline attached Exudate Amount: Medium Exudate Type: Purulent Exudate Color: yellow, brown, green ures Foul Odor After Cleansing: No Slough/Fibrino Yes % Reduction in Area: -47.1% % Reduction in Volume: -267.7% Epithelialization: Small (1-33%) Tunneling: No Undermining: No Wound Bed Granulation Amount: Small (1-33%) Exposed Structure Granulation Quality: Red Fascia Exposed: No Necrotic Amount: Large (67-100%) Fat Layer (Subcutaneous Tissue) Exposed: Yes Necrotic Quality: Adherent Slough Tendon Exposed: No Muscle Exposed:  No Joint Exposed: No Bone Exposed: No Treatment Notes Wound #3 (Lower Leg) Wound Laterality: Right, Medial Cleanser Peri-Wound Care Ketoconazole Cream 2% Discharge Instruction: Apply Ketoconazole to periwoiund rash Triamcinolone 15 (g) Discharge Instruction: Use triamcinolone 15 (g) as directed Sween Lotion (Moisturizing lotion) Discharge Instruction: Apply moisturizing lotion as directed Topical Primary Dressing KerraCel Ag Gelling Fiber Dressing, 4x5 in (silver alginate) Discharge Instruction: Apply silver alginate to wound bed as instructed Secondary Dressing ABD Pad, 8x10 Discharge Instruction: Apply over primary dressing as directed. Woven Gauze Sponge, Non-Sterile 4x4 in Discharge Instruction: Apply over primary dressing as directed. Secured With Compression Wrap ThreePress (3 layer compression wrap) Discharge Instruction: Apply three layer compression as directed. Compression Stockings Add-Ons Electronic Signature(s) Signed: 04/14/2022 5:41:34 PM By: Baruch Gouty RN, BSN Entered By: Baruch Gouty on 04/14/2022 15:02:33 -------------------------------------------------------------------------------- Vitals Details Patient Name: Date of Service: [name] 04/14/2022 2:00 PM Medical Record Number: [id number] Patient Account Number: [id number] Date of Birth/Sex: Treating RN: 21-Mar-1951 (71 y.o. [name] Primary Care Virgene Tirone: PA [name], [place] Other Clinician: Referring Averlee Swartz: Treating Shakeeta Godette/Extender: [name] in Treatment: 9 Vital Signs Time Taken: 14:40 Temperature (F): 98.0 Pulse (bpm): 73 Respiratory Rate (breaths/min): 18 Blood Pressure (mmHg): 143/76 Reference Range: 80 - 120 mg / dl Electronic Signature(s) Signed: 04/14/2022 5:41:34 PM By: Baruch Gouty RN, BSN Entered By: Baruch Gouty on 04/14/2022 14:40:30

## 2022-04-14 NOTE — Progress Notes (Signed)
MOSTYN, VARNELL ([id number]) Visit Report for 04/14/2022 Chief Complaint Document Details Patient Name: Date of Service: [name], [name] 04/14/2022 2:00 PM Medical Record Number: [id number] Patient Account Number: [id number] Date of Birth/Sex: Treating RN: [birth year] (71 y.o. M) Primary Care Provider: PA [name], NO Other Clinician: Referring Provider: Treating Provider/Extender: [name] in Treatment: 9 Information Obtained from: Patient Chief Complaint Patient seen for complaints of Non-Healing Wounds. Electronic Signature(s) Signed: 04/14/2022 3:16:39 PM By: Fredirick Maudlin MD FACS Entered By: Fredirick Maudlin on 04/14/2022 15:16:39 -------------------------------------------------------------------------------- Debridement Details Patient Name: Date of Service: [name] 04/14/2022 2:00 PM Medical Record Number: [id number] Patient Account Number: [id number] Date of Birth/Sex: Treating RN: 1951-02-25 (71 y.o. [name] Primary Care Provider: PA [name], [place] Other Clinician: Referring Provider: Treating Provider/Extender: [name] in Treatment: 9 Debridement Performed for Assessment: Wound #3 Right,Medial Lower Leg Performed By: Physician Fredirick Maudlin, MD Debridement Type: Debridement Level of Consciousness (Pre-procedure): Awake and Alert Pre-procedure Verification/Time Out Yes - 15:05 Taken: Start Time: 15:05 Pain Control: Lidocaine 4% T opical Solution T Area Debrided (L x W): otal 2.5 (cm) x 4 (cm) = 10 (cm) Tissue and other material debrided: Viable, Non-Viable, Slough, Subcutaneous, Slough Level: Skin/Subcutaneous Tissue Debridement Description: Excisional Instrument: Curette Bleeding: Minimum Hemostasis Achieved: Pressure Procedural Pain: 2 Post Procedural Pain: 1 Response to Treatment: Procedure was tolerated well Level of Consciousness (Post- Awake and Alert procedure): Post Debridement Measurements  of Total Wound Length: (cm) 2.5 Width: (cm) 4 Depth: (cm) 1 Volume: (cm) 7.854 Character of Wound/Ulcer Post Debridement: Improved Post Procedure Diagnosis Same as Pre-procedure Notes scribed by Baruch Gouty, RN for Dr. [name] Electronic Signature(s) Signed: 04/14/2022 4:08:58 PM By: Fredirick Maudlin MD FACS Signed: 04/14/2022 5:41:34 PM By: Baruch Gouty RN, BSN Entered By: Baruch Gouty on 04/14/2022 15:12:36 -------------------------------------------------------------------------------- Debridement Details Patient Name: Date of Service: [name] 04/14/2022 2:00 PM Medical Record Number: [id number] Patient Account Number: [id number] Date of Birth/Sex: Treating RN: 1950-08-01 (71 y.o. [name] Primary Care Provider: PA [name], [place] Other Clinician: Referring Provider: Treating Provider/Extender: [name] in Treatment: 9 Debridement Performed for Assessment: Wound #1 Left,Dorsal Foot Performed By: Physician Fredirick Maudlin, MD Debridement Type: Debridement Level of Consciousness (Pre-procedure): Awake and Alert Pre-procedure Verification/Time Out Yes - 15:05 Taken: Start Time: 15:05 Pain Control: Lidocaine 4% T opical Solution T Area Debrided (L x W): otal 1.9 (cm) x 2.1 (cm) = 3.99 (cm) Tissue and other material debrided: Non-Viable, Slough, Slough Level: Non-Viable Tissue Debridement Description: Selective/Open Wound Instrument: Curette Bleeding: Minimum Hemostasis Achieved: Pressure Procedural Pain: 2 Post Procedural Pain: 1 Response to Treatment: Procedure was tolerated well Level of Consciousness (Post- Awake and Alert procedure): Post Debridement Measurements of Total Wound Length: (cm) 1.9 Width: (cm) 2.1 Depth: (cm) 0.2 Volume: (cm) 0.627 Character of Wound/Ulcer Post Debridement: Improved Post Procedure Diagnosis Same as Pre-procedure Notes scribed by Baruch Gouty, RN for Dr. [name] Electronic  Signature(s) Signed: 04/14/2022 4:08:58 PM By: Fredirick Maudlin MD FACS Signed: 04/14/2022 5:41:34 PM By: Baruch Gouty RN, BSN Entered By: Baruch Gouty on 04/14/2022 15:12:46 -------------------------------------------------------------------------------- HPI Details Patient Name: Date of Service: [name] 04/14/2022 2:00 PM Medical Record Number: [id number] Patient Account Number: [id number] Date of Birth/Sex: Treating RN: [birth year] (71 y.o. M) Primary Care Provider: PA [name], NO Other Clinician: Referring Provider: Treating Provider/Extender: [name] in Treatment: 9 History of Present Illness HPI Description: ADMISSION 02/09/2022 This is a 71 year old man who recently moved to  [place] from [place]. He has a history of of coronary artery disease and prior left popliteal vein DVT . He is not diabetic and does not smoke. 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 He has had multiple spinal surgeries and has limited mobility. He primarily uses a motorized wheelchair. He has had lymphedema for many years and does have lymphedema pumps although he says he does not use them frequently. He has wounds on his bilateral lower extremities. He was receiving care in [place] for these prior to his move. They were apparently packing his wounds with Betadine soaked gauze. He has worn compression wraps in the past but not recently. He did say that they helped tremendously. In clinic today, his right ABI is noncompressible but has good signals; the left ABI was 1.17. No formal vascular studies have been performed. On his left dorsal foot, there is a circular lesion that exposes the fat layer. There is fairly heavy slough accumulation within the wound, but no significant odor. On his right medial calf, he has multiple pinholes that have slough buildup in them and probe for about 2 to 4 mm in depth. They are draining clear fluid. On his right lateral  midfoot, he has ulceration consistent with venous stasis. It is geographic and has slough accumulation. 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 02/17/2022: All of the wounds are little bit larger today and have more slough accumulation. Today, the medial calf openings are larger and probe to something that feels calcified. There is odor coming from his wounds. His vascular studies are scheduled for August 9 and 10. 02-24-2022 upon evaluation today patient presents for follow-up concerning his ongoing issues here with his lower extremities. With that being said he does have significant problems with what appears to be cellulitis unfortunately the PCR culture that was sent last week got crushed by UPS in transit. With that being said we are going to reobtain a culture today although I am actually to do it on the right medial leg as this seems to be the most inflamed area today where there is some questionable drainage as well. I am going to remove some of the calcium deposits which are loosening obtain a deeper culture from this area. Fortunately I do not see any evidence of active infection systemically which is good news but locally I think we are having a bigger issue here. He does have his appointment next Thursday with vascular. Patient has also been referred to [facility] for further evaluation and treatment of the calcinosis for possible sulfate injections. 03/04/2022: The culture that was performed last week grew out multiple species including Klebsiella, Morganella, and Pseudomonas aeruginosa. He had been prescribed Bactrim but this was not adequate to cover all of the species and levofloxacin was recommended. He completed the Bactrim but did not pick up the levofloxacin and begin taking it until yesterday. 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 He had arterial studies performed today. He does have evidence of peripheral arterial disease. Results copied here: +-------+-----------+-----------+------------+------------+ ABI/TBIT oday's ABIT oday's TBIPrevious ABIPrevious TBI +-------+-----------+-----------+------------+------------+ Right [place] 0.75    +-------+-----------+-----------+------------+------------+ Left [place] 0.57    +-------+-----------+-----------+------------+------------+ Arterial wall calcification precludes accurate ankle pressures and ABIs. Summary: Right: Resting right ankle-brachial index indicates noncompressible  right lower extremity arteries. The right toe-brachial index is normal. Left: Resting left ankle-brachial index indicates noncompressible left lower extremity arteries. The left toe-brachial index is abnormal. He continues to have further tissue breakdown at the right medial calf and there is ample slough accumulation along with necrotic subcutaneous tissue. The left dorsal foot wound has built up slough, as well. The right medial foot wound is nearly closed with just a light layer of eschar over the surface. The right dorsal lateral foot wound has also accumulated a fair amount of slough and remains quite tender. 03/10/2022: He has been taking the prescribed levofloxacin and has about 3 days left of this. The erythema and induration on his right leg has improved. He continues to experience further breakdown of the right medial calf wound. There is extensive slough and debris accumulation there. There is heavy slough on his left dorsal foot wound, but no odor or purulent drainage. The right medial foot wound appears to be closed. The right dorsal lateral foot wound also has a fair amount of slough but it is less tender than at our last visit. He still does not have an appointment with  dermatology. 03/22/2022: The right anterior tibial wound has closed. The right lateral foot wound is nearly closed with just a little bit of slough. The left dorsal foot wound is smaller and continues to have some slough buildup but less so than at prior visits. There is good granulation tissue underlying the slough. Unfortunately the right medial calf wound continues to deteriorate. It has a foul odor today and a lot of necrotic tissue, the surrounding skin is also erythematous and moist. 03/30/2022: The right lateral foot wound continues to contract and just has a little bit of slough and eschar present. The left dorsal foot wound is also smaller with slough overlying good granulation tissue. The right medial calf wound actually looks quite a bit better today; there is no odor and there is much less necrotic tissue although some remains present. We have been using topical gentamicin and he has been taking oral Augmentin. 04/07/2022: The patient saw dermatology at [facility] last week. Unfortunately, it appears that they did not read any of the documentation that was provided. 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 Overall, it was a highly unsatisfactory consultation. In the meantime, however, we were able to identify compounding pharmacy who could create a topical sodium thiosulfate ointment. The patient has that with him today. Both of the right foot wounds are now closed. The left dorsal foot wound has contracted but still has a layer of slough on the surface. The medial calf wounds on the right all look a little bit better. They still have heavy slough along with the chunks of calcium. Everything is stained purple. 04/14/2022: The  wound on his dorsal left foot is a little bit smaller again today. There is still slough accumulation on the surface. The medial calf wounds have quite a bit less slough accumulation today. His right leg, however, is warm and erythematous, concerning for cellulitis. Electronic Signature(s) Signed: 04/14/2022 3:17:37 PM By: Fredirick Maudlin MD FACS Entered By: Fredirick Maudlin on 04/14/2022 15:17:37 -------------------------------------------------------------------------------- Physical Exam Details Patient Name: Date of Service: [name] 04/14/2022 2:00 PM Medical Record Number:  [id number] Patient Account Number: [id number] Date of Birth/Sex: Treating RN: May 20, 1951 (71 y.o. M) Primary Care Provider: PA [name], NO Other Clinician: Referring Provider: Treating Provider/Extender: [name], [name] in Treatment: 9 Constitutional . . . . No acute distress.Marland Kitchen Respiratory Normal work of breathing on room air.. Notes 04/14/2022: The wound on his dorsal left foot is a little bit smaller again today. There is still slough accumulation on the surface. The medial calf wounds have quite a bit less slough accumulation today. His right leg, however, is warm and erythematous, concerning for cellulitis. Electronic Signature(s) Signed: 04/14/2022 3:18:05 PM By: Fredirick Maudlin MD FACS Entered By: Fredirick Maudlin on 04/14/2022 15:18:05 -------------------------------------------------------------------------------- Physician Orders Details Patient Name: Date of Service: [name] 04/14/2022 2:00 PM Medical Record Number: [id number] Patient Account Number: [id number] Date of Birth/Sex: Treating RN: 1950/12/23 (70 y.o. [name] Primary Care Provider: PA [name], [place] Other Clinician: Referring Provider: Treating Provider/Extender: [name] in Treatment: 9 Verbal / Phone Orders: No Diagnosis Coding ICD-10 Coding Code Description [phone]  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d L97.522 Non-pressure chronic ulcer of other part of left foot with fat layer exposed 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) I10 Essential (primary) hypertension I25.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 I89.0 Lymphedema, not elsewhere classified Z86.718 Personal 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 - Dr. [name] Rm 1 Return A Anesthetic Wound #1 Left,Dorsal Foot (In clinic) Topical Lidocaine 4% applied to wound bed Wound #3 Right,Medial Lower Leg (In clinic) Topical Lidocaine 4% applied to wound bed Bathing/ Shower/ Hygiene May shower with protection but do not get wound dressing(s) wet. Edema Control - Lymphedema / SCD / Other Lymphedema Pumps. Use Lymphedema pumps on leg(s) 2-3 times a day for 45-60 minutes. If wearing any wraps or hose, do not remove them. Continue exercising as instructed. - use 1-2 times per day over wraps Elevate legs to the level of the heart or above for 30 minutes daily and/or when sitting, a frequency of: Exercise regularly Wound Treatment Wound #1 - Foot Wound Laterality: Dorsal, Left Peri-Wound Care: Triamcinolone 15 (g) 1 x Per Week Discharge Instructions: Use triamcinolone 15 (g) as directed Peri-Wound Care: Zinc Oxide Ointment 30g tube 1 x Per Week Discharge Instructions: Apply Zinc Oxide to periwound with each dressing change Peri-Wound Care: Sween Lotion (Moisturizing lotion) 1 x Per Week Discharge Instructions: Apply moisturizing lotion as directed Topical: Sodium Thiosulfate 1 x Per Week Prim Dressing: KerraCel Ag Gelling Fiber Dressing, 4x5 in (silver alginate) 1 x Per Week ary Discharge Instructions: Apply silver alginate to wound bed as  instructed Secondary Dressing: Woven Gauze Sponge, Non-Sterile 4x4 in 1 x Per Week Discharge Instructions: Apply over primary dressing as directed. Secondary Dressing: Zetuvit Plus 4x8 in 1 x Per Week Discharge Instructions: Apply over primary dressing as directed. Compression Wrap: ThreePress (3 layer compression wrap) 1 x Per Week Discharge Instructions: Apply three layer compression as directed. Wound #3 - Lower Leg Wound Laterality: Right, Medial Peri-Wound Care: Ketoconazole Cream 2% 1 x Per Week Discharge Instructions: Apply Ketoconazole to periwoiund rash Peri-Wound Care: Triamcinolone 15 (g) 1 x Per Week Discharge Instructions: Use triamcinolone 15 (g) as directed Peri-Wound Care: Sween Lotion (Moisturizing lotion) 1 x Per Week Discharge Instructions: Apply moisturizing lotion as directed Prim Dressing:  KerraCel Ag Gelling Fiber Dressing, 4x5 in (silver alginate) 1 x Per Week ary Discharge Instructions: Apply silver alginate to wound bed as instructed Secondary Dressing: ABD Pad, 8x10 1 x Per Week Discharge Instructions: Apply over primary dressing as directed. Secondary Dressing: Woven Gauze Sponge, Non-Sterile 4x4 in 1 x Per Week Discharge Instructions: Apply over primary dressing as directed. Compression Wrap: ThreePress (3 layer compression wrap) 1 x Per Week Discharge Instructions: Apply three layer compression as directed. Patient Medications llergies: No Known Allergies A Notifications Medication Indication Start End 04/14/2022 amoxicillin-pot clavulanate DOSE oral 875 mg-125 mg tablet - 1 tab p.o. twice daily x7 days Electronic Signature(s) Signed: 04/14/2022 3:21:21 PM By: Fredirick Maudlin MD FACS Entered By: Fredirick Maudlin on 04/14/2022 15:21:20 -------------------------------------------------------------------------------- Problem List Details Patient Name: Date of Service: [name] 04/14/2022 2:00 PM Medical Record Number: [id number] Patient Account  Number: [id number] Date of Birth/Sex: Treating RN: 1950-08-13 (71 y.o. [name] Primary Care Provider: PA [name], [place] Other Clinician: Referring Provider: Treating Provider/Extender: [name] in Treatment: 9 Active Problems ICD-10 Encounter Code Description Active Date MDM Diagnosis L97.812 Non-pressure chronic ulcer of other part of right lower leg with fat layer 02/09/2022 No Yes exposed L97.522 Non-pressure chronic ulcer of other part of left foot with fat layer exposed 02/09/2022 No Yes 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) 02/09/2022 No Yes I10 Essential (primary) hypertension 02/09/2022 No Yes I25.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 02/09/2022 No Yes I89.0 Lymphedema, not elsewhere classified 02/09/2022 No Yes Z86.718 Personal history of other venous thrombosis and embolism 02/09/2022 No Yes L97.512 Non-pressure chronic ulcer of other part of right foot with fat layer exposed 02/17/2022 No Yes 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 M) Primary Care Provider: PA [name], NO Other Clinician: Referring Provider: Treating Provider/Extender: [name] in Treatment: 9 Subjective Chief Complaint Information obtained from Patient Patient seen for complaints of Non-Healing Wounds. History of Present Illness (HPI) ADMISSION 02/09/2022 This is a 71 year old man who recently moved to [place] from [place]. He has a  history of of coronary artery disease and prior left popliteal vein DVT . He is not diabetic and does not smoke. 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 He has had multiple spinal surgeries and has limited mobility. He primarily uses a motorized wheelchair. He has had lymphedema for many years and does have lymphedema pumps although he says he does not use them frequently. He has wounds on his bilateral lower extremities. He was receiving care in [place] for these prior to his move. They were apparently packing his wounds with Betadine soaked gauze. He has worn compression wraps in the past but not recently. He did say that they helped tremendously. In clinic today, his right ABI is noncompressible but has good signals; the left ABI was 1.17. No formal vascular studies have been performed. On his left dorsal foot, there is a circular lesion that exposes the fat layer. There is fairly heavy slough accumulation within the wound, but no significant odor. On his right medial calf, he has multiple pinholes that have slough buildup in them and probe for about 2  to 4 mm in depth. They are draining clear fluid. On his right lateral midfoot, he has ulceration consistent with venous stasis. It is geographic and has slough accumulation. 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 02/17/2022: All of the wounds are little bit larger today and have more slough accumulation. Today, the medial calf openings are larger and probe to something that feels calcified. There is odor coming from his wounds. His vascular studies are scheduled for August 9 and 10. 02-24-2022 upon evaluation today patient presents for follow-up concerning his ongoing issues here with his lower extremities. With that being said he does have significant problems with what appears to be cellulitis unfortunately the PCR culture that was sent last week  got crushed by [employer] in transit. With that being said we are going to reobtain a culture today although I am actually to do it on the right medial leg as this seems to be the most inflamed area today where there is some questionable drainage as well. I am going to remove some of the calcium deposits which are loosening obtain a deeper culture from this area. Fortunately I do not see any evidence of active infection systemically which is good news but locally I think we are having a bigger issue here. He does have his appointment next Thursday with vascular. Patient has also been referred to [facility] for further evaluation and treatment of the calcinosis for possible sulfate injections. 03/04/2022: The culture that was performed last week grew out multiple species including Klebsiella, Morganella, and Pseudomonas aeruginosa. He had been prescribed Bactrim but this was not adequate to cover all of the species and levofloxacin was recommended. He completed the Bactrim but did not pick up the levofloxacin and begin taking it until yesterday. 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 He had arterial studies performed today. He does have evidence of peripheral arterial disease. Results copied here: +-------+-----------+-----------+------------+------------+ ABI/TBIT oday's ABIT oday's TBIPrevious ABIPrevious TBI +-------+-----------+-----------+------------+------------+ Right [place] 0.75    +-------+-----------+-----------+------------+------------+ Left [place] 0.57    +-------+-----------+-----------+------------+------------+ Arterial wall calcification precludes accurate ankle pressures and ABIs. Summary: Right: Resting right ankle-brachial index indicates noncompressible right lower extremity arteries. The right  toe-brachial index is normal. Left: Resting left ankle-brachial index indicates noncompressible left lower extremity arteries. The left toe-brachial index is abnormal. He continues to have further tissue breakdown at the right medial calf and there is ample slough accumulation along with necrotic subcutaneous tissue. The left dorsal foot wound has built up slough, as well. The right medial foot wound is nearly closed with just a light layer of eschar over the surface. The right dorsal lateral foot wound has also accumulated a fair amount of slough and remains quite tender. 03/10/2022: He has been taking the prescribed levofloxacin and has about 3 days left of this. The erythema and induration on his right leg has improved. He continues to experience further breakdown of the right medial calf wound. There is extensive slough and debris accumulation there. There is heavy slough on his left dorsal foot wound, but no odor or purulent drainage. The right medial foot wound appears to be closed. The right dorsal lateral foot wound also has a fair amount of slough but it is less tender than at our last visit. He still does not have  an appointment with dermatology. 03/22/2022: The right anterior tibial wound has closed. The right lateral foot wound is nearly closed with just a little bit of slough. The left dorsal foot wound is smaller and continues to have some slough buildup but less so than at prior visits. There is good granulation tissue underlying the slough. Unfortunately the right medial calf wound continues to deteriorate. It has a foul odor today and a lot of necrotic tissue, the surrounding skin is also erythematous and moist. 03/30/2022: The right lateral foot wound continues to contract and just has a little bit of slough and eschar present. The left dorsal foot wound is also smaller with slough overlying good granulation tissue. The right medial calf wound actually looks quite a bit better today;  there is no odor and there is much less necrotic tissue although some remains present. We have been using topical gentamicin and he has been taking oral Augmentin. 04/07/2022: The patient saw dermatology at [facility] last week. Unfortunately, it appears that they did not read any of the documentation that was provided. 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 Overall, it was a highly unsatisfactory consultation. In the meantime, however, we were able to identify compounding pharmacy who could create a topical sodium thiosulfate ointment. The patient has that with him today. Both of the right foot wounds are now closed. The left dorsal foot wound has contracted but still has a layer of slough on the surface. The medial calf wounds on the right all look a little bit better. They still have heavy slough along with the chunks of calcium. Everything is stained purple. 04/14/2022: The wound on his dorsal left foot is a little bit smaller again today. There is still slough accumulation on the surface. The medial calf wounds have quite a bit less slough accumulation today. His right leg, however, is warm and erythematous, concerning for cellulitis. Patient History Information obtained from Patient, Chart. Family History Cancer - Mother, Heart Disease - Father,Mother, Hypertension - Father,Siblings, Lung Disease - Siblings, No family history of Diabetes, Hereditary Spherocytosis, Kidney Disease, Seizures, Stroke, Thyroid Problems, Tuberculosis. Social History Former smoker - quit 1991, Marital Status - Married, Alcohol Use - Moderate, Drug Use - No History, Caffeine Use - Daily - coffee. Medical History Eyes Patient has history of  Cataracts - right eye removed Denies history of Glaucoma, Optic Neuritis Ear/Nose/Mouth/Throat Denies history of Chronic sinus problems/congestion, Middle ear problems Hematologic/Lymphatic Patient has history of Anemia - macrocytic, Lymphedema Cardiovascular Patient has history of Congestive Heart Failure, Deep Vein Thrombosis - left leg, Hypertension, Peripheral Venous Disease Endocrine Denies history of Type I Diabetes, Type II Diabetes Genitourinary Denies history of End Stage Renal Disease Integumentary (Skin) Denies history of History of Burn Oncologic Denies history of Received Chemotherapy, Received Radiation Psychiatric Denies history of Anorexia/bulimia, Confinement Anxiety Hospitalization/Surgery History - cervical fusion. - lumbar surgery. - coronary stent placement. - lasik eye surgery. - hydrocele excision. Medical A Surgical History Notes nd Constitutional Symptoms (General Health) obesity Ear/Nose/Mouth/Throat hard of hearing Respiratory frozen diaphragm right Cardiovascular hyperlipidemia Musculoskeletal myositis, lumbar DDD, spinal stenosis, cervical facet joint syndrome Objective Constitutional No acute distress.. Vitals Time Taken: 2:40 PM, Temperature: 98.0 F, Pulse: 73 bpm, Respiratory Rate: 18 breaths/min, Blood Pressure: 143/76  mmHg. Respiratory Normal work of breathing on room air.. General Notes: 04/14/2022: The wound on his dorsal left foot is a little bit smaller again today. There is still slough accumulation on the surface. The medial calf wounds have quite a bit less slough accumulation today. His right leg, however, is warm and erythematous, concerning for cellulitis. Integumentary (Hair, Skin) Wound #1 status is Open. Original cause of wound was Gradually Appeared. The date acquired was: 12/07/2021. The wound has been in treatment 9 weeks. The wound is located on the Left,Dorsal Foot. The wound measures 1.9cm length x 2.1cm width x 0.2cm  depth; 3.134cm^2 area and 0.627cm^3 volume. There is Fat Layer (Subcutaneous Tissue) exposed. There is no tunneling or undermining noted. There is a large amount of serosanguineous drainage noted. The wound margin is well defined and not attached to the wound base. There is medium (34-66%) red granulation within the wound bed. There is a medium (34-66%) amount of necrotic tissue within the wound bed including Adherent Slough. Wound #3 status is Open. Original cause of wound was Gradually Appeared. The date acquired was: 12/07/2021. The wound has been in treatment 9 weeks. The wound is located on the Right,Medial Lower Leg. The wound measures 2.5cm length x 4cm width x 1cm depth; 7.854cm^2 area and 7.854cm^3 volume. There is Fat Layer (Subcutaneous Tissue) exposed. There is no tunneling or undermining noted. There is a medium amount of purulent drainage noted. The wound margin is distinct with the outline attached to the wound base. There is small (1-33%) red granulation within the wound bed. There is a large (67-100%) amount of necrotic tissue within the wound bed including Adherent Slough. 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 There was a Selective/Open Wound Non-Viable Tissue Debridement with a total area of 3.99 sq cm performed by Fredirick Maudlin, MD. With the following instrument(s): Curette  to remove Non-Viable tissue/material. Material removed includes [facility] after achieving pain control using Lidocaine 4% Topical Solution. No specimens were taken. A time out was conducted at 15:05, prior to the start of the procedure. A Minimum amount of bleeding was controlled with Pressure. The procedure was tolerated well with a pain level of 2 throughout and a pain level of 1 following the procedure. Post Debridement Measurements: 1.9cm length x 2.1cm width x 0.2cm depth; 0.627cm^3 volume. Character of Wound/Ulcer Post Debridement is improved. Post procedure Diagnosis Wound #1: Same as Pre-Procedure General Notes: scribed by Baruch Gouty, RN for Dr. [name]. Wound #3 Pre-procedure diagnosis of Wound #3 is a Lymphedema located on the Right,Medial Lower Leg . There was a Excisional Skin/Subcutaneous Tissue Debridement with a total area of 10 sq cm performed by Fredirick Maudlin, MD. With the following instrument(s): Curette to remove Viable and Non-Viable tissue/material. Material removed includes Subcutaneous Tissue and Slough and after achieving pain control using Lidocaine 4% T opical Solution. No specimens were taken. A time out was conducted at 15:05, prior to the start of the procedure. A Minimum amount of bleeding was controlled with Pressure. The  procedure was tolerated well with a pain level of 2 throughout and a pain level of 1 following the procedure. Post Debridement Measurements: 2.5cm length x 4cm width x 1cm depth; 7.854cm^3 volume. Character of Wound/Ulcer Post Debridement is improved. Post procedure Diagnosis Wound #3: Same as Pre-Procedure General Notes: scribed by Baruch Gouty, RN for Dr. [name]. Plan Follow-up Appointments: Return Appointment in 1 week. - Dr. [name] Rm 1 Anesthetic: Wound #1 Left,Dorsal Foot: (In clinic) Topical Lidocaine 4% applied to wound bed Wound #3 Right,Medial Lower Leg: (In clinic) Topical Lidocaine 4% applied to wound bed Bathing/ Shower/  Hygiene: May shower with protection but do not get wound dressing(s) wet. Edema Control - Lymphedema / SCD / Other: Lymphedema Pumps. Use Lymphedema pumps on leg(s) 2-3 times a day for 45-60 minutes. If wearing any wraps or hose, do not remove them. Continue exercising as instructed. - use 1-2 times per day over wraps Elevate legs to the level of the heart or above for 30 minutes daily and/or when sitting, a frequency of: Exercise regularly The following medication(s) was prescribed: amoxicillin-pot clavulanate oral 875 mg-125 mg tablet 1 tab p.o. twice daily x7 days starting 04/14/2022 WOUND #1: - Foot Wound Laterality: Dorsal, Left Peri-Wound Care: Triamcinolone 15 (g) 1 x Per Week/ Discharge Instructions: Use triamcinolone 15 (g) as directed Peri-Wound Care: Zinc Oxide Ointment 30g tube 1 x Per Week/ Discharge Instructions: Apply Zinc Oxide to periwound with each dressing change Peri-Wound Care: Sween Lotion (Moisturizing lotion) 1 x Per Week/ Discharge Instructions: Apply moisturizing lotion as directed Topical: Sodium Thiosulfate 1 x Per Week/ Prim Dressing: KerraCel Ag Gelling Fiber Dressing, 4x5 in (silver alginate) 1 x Per Week/ ary Discharge Instructions: Apply silver alginate to wound bed as instructed Secondary Dressing: Woven Gauze Sponge, Non-Sterile 4x4 in 1 x Per Week/ Discharge Instructions: Apply over primary dressing as directed. Secondary Dressing: Zetuvit Plus 4x8 in 1 x Per Week/ Discharge Instructions: Apply over primary dressing as directed. Com pression Wrap: ThreePress (3 layer compression wrap) 1 x Per Week/ Discharge Instructions: Apply three layer compression as directed. WOUND #3: - Lower Leg Wound Laterality: Right, Medial Peri-Wound Care: Ketoconazole Cream 2% 1 x Per Week/ Discharge Instructions: Apply Ketoconazole to periwoiund rash Peri-Wound Care: Triamcinolone 15 (g) 1 x Per Week/ Discharge Instructions: Use triamcinolone 15 (g) as directed Peri-Wound  Care: Sween Lotion (Moisturizing lotion) 1 x Per Week/ Discharge Instructions: Apply moisturizing lotion as directed Prim Dressing: KerraCel Ag Gelling Fiber Dressing, 4x5 in (silver alginate) 1 x Per Week/ ary Discharge Instructions: Apply silver alginate to wound bed as instructed Secondary Dressing: ABD Pad, 8x10 1 x Per Week/ Discharge Instructions: Apply over primary dressing as directed. Secondary Dressing: Woven Gauze Sponge, Non-Sterile 4x4 in 1 x Per Week/ Discharge Instructions: Apply over primary dressing as directed. Com pression Wrap: ThreePress (3 layer compression wrap) 1 x Per Week/ Discharge Instructions: Apply three layer compression as directed. 04/14/2022: The wound on his dorsal left foot is a little bit smaller again today. There is still slough accumulation on the surface. The medial calf wounds have quite a bit less slough accumulation today. His right leg, however, is warm and erythematous, concerning for cellulitis. Patient tried to debride slough off of the dorsal foot wound on the left and slough and subcutaneous tissue from the right medial calf wounds. I am concerned that he may have cellulitis; I have prescribed a course of Augmentin. We will continue to use the compounded sodium thiosulfate ointment to the right medial calf  wounds with silver alginate on the left dorsal foot wound. Bilateral 3 layer compression. Follow-up in 1 week. Electronic Signature(s) Signed: 04/14/2022 3:23:07 PM By: Fredirick Maudlin MD FACS Entered By: Fredirick Maudlin on 04/14/2022 15:23:07 -------------------------------------------------------------------------------- HxROS Details Patient Name: Date of Service: [name] 04/14/2022 2:00 PM Medical Record Number: [id number] Patient Account Number: [id number] Date of Birth/Sex: Treating RN: Oct 27, 1950 (71 y.o. M) Primary Care Provider: PA [name], NO Other Clinician: Referring Provider: Treating Provider/Extender: [name] in Treatment: 9 Information Obtained From Patient Chart Constitutional Symptoms (General Health) Medical History: Past Medical History Notes: obesity Eyes Medical History: Positive for: Cataracts - right eye removed Negative for: Glaucoma; Optic Neuritis Ear/Nose/Mouth/Throat Medical History: Negative for: Chronic sinus problems/congestion; Middle ear problems Past Medical History Notes: hard of hearing Hematologic/Lymphatic Medical History: Positive for: Anemia - macrocytic; Lymphedema Respiratory Medical History: Past Medical History Notes: frozen diaphragm right Cardiovascular Medical History: Positive for: Congestive Heart Failure; Deep Vein Thrombosis - left leg; Hypertension; Peripheral Venous Disease Past Medical History Notes: hyperlipidemia Endocrine Medical History: Negative for: Type I Diabetes; Type II Diabetes Genitourinary Medical History: Negative for: End Stage Renal Disease Integumentary (Skin) Medical History: Negative for: History of Burn Musculoskeletal Medical History: Past Medical History Notes: myositis, lumbar DDD, spinal stenosis, cervical facet joint syndrome Oncologic Medical History: Negative for: Received Chemotherapy; Received Radiation Psychiatric Medical History: Negative for: Anorexia/bulimia; Confinement Anxiety HBO Extended History Items Eyes: Cataracts Immunizations Pneumococcal Vaccine: Received Pneumococcal Vaccination: Yes Received Pneumococcal Vaccination On or After 60th Birthday: Yes Implantable Devices No devices added Hospitalization / Surgery History Type of Hospitalization/Surgery cervical fusion lumbar surgery coronary stent placement lasik eye surgery hydrocele excision Family and Social History Cancer: Yes - Mother; Diabetes: No; Heart Disease: Yes - Father,Mother; Hereditary Spherocytosis: No; Hypertension: Yes - Father,Siblings; Kidney Disease: No; Lung Disease: Yes -  Siblings; Seizures: No; Stroke: No; Thyroid Problems: No; Tuberculosis: No; Former smoker - quit 1991; Marital Status - Married; Alcohol Use: Moderate; Drug Use: No History; Caffeine Use: Daily - coffee; Financial Concerns: No; Food, Clothing or Shelter Needs: No; Support System Lacking: No; Transportation Concerns: No Electronic Signature(s) Signed: 04/14/2022 4:08:58 PM By: Fredirick Maudlin MD FACS Entered By: Fredirick Maudlin on 04/14/2022 15:17:42 -------------------------------------------------------------------------------- SuperBill Details Patient Name: Date of Service: [name], [name] 04/14/2022 Medical Record Number: [id number] Patient Account Number: [id number] Date of Birth/Sex: Treating RN: 1951/06/16 (71 y.o. M) Primary Care Provider: PA [name], NO Other Clinician: Referring Provider: Treating Provider/Extender: [name] in Treatment: 9 Diagnosis Coding ICD-10 Codes Code Description [id number] 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d L97.522 Non-pressure chronic ulcer of other part of left foot with fat layer exposed 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) I10 Essential (primary) hypertension I25.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 I89.0 Lymphedema, not elsewhere classified Z86.718 Personal 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 Facility Procedures CPT4 Code: [id number] Description: 11042 - DEB SUBQ TISSUE 20 SQ CM/< ICD-10 Diagnosis Description 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 Modifier: ed Quantity: 1 CPT4 Code: [id number] Description: 96222 - DEBRIDE WOUND 1ST 20 SQ CM OR < ICD-10 Diagnosis Description L97.522 Non-pressure chronic ulcer of other part of left foot with fat layer  exposed Modifier: Quantity: 1 Physician Procedures : CPT4 Code Description Modifier [id number] 19417 - WC PHYS  LEVEL 4 - EST PT 25 ICD-10 Diagnosis Description E95.284 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d L97.522 Non-pressure chronic ulcer of other part of left  foot with fat layer exposed 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) Quantity: 1 : [id number] 11042 - WC PHYS SUBQ TISS 20 SQ CM ICD-10 Diagnosis Description 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d Quantity: 1 : [id number] 64403 - WC PHYS DEBR WO ANESTH 20 SQ CM ICD-10 Diagnosis Description L97.522 Non-pressure chronic ulcer of other part of left foot with fat layer exposed Quantity: 1 Electronic Signature(s) Signed: 04/14/2022 3:23:36 PM By: Fredirick Maudlin MD FACS Entered By: Fredirick Maudlin on 04/14/2022 15:23:36

## 2022-04-15 NOTE — Addendum Note (Signed)
Addended by: Stephani Police on: 04/15/2022 08:48 AM   Modules accepted: Orders

## 2022-04-16 NOTE — Progress Notes (Signed)
This is an error--wrong patient data entered into glucometer.  These results are NOT those of [name].

## 2022-04-22 ENCOUNTER — Encounter (HOSPITAL_BASED_OUTPATIENT_CLINIC_OR_DEPARTMENT_OTHER): Payer: Medicare Other | Admitting: General Surgery

## 2022-04-22 DIAGNOSIS — I890 Lymphedema, not elsewhere classified: Secondary | ICD-10-CM | POA: Diagnosis not present

## 2022-04-22 DIAGNOSIS — L97812 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d: Secondary | ICD-10-CM | POA: Diagnosis not present

## 2022-04-22 DIAGNOSIS — L97512 Non-pressure chronic ulcer of other part of right foot with fat layer exposed: Secondary | ICD-10-CM | POA: Diagnosis not present

## 2022-04-22 DIAGNOSIS — I110 Hypertensive heart disease with heart failure: Secondary | ICD-10-CM | POA: Diagnosis not present

## 2022-04-22 DIAGNOSIS — I872 Venous insufficiency (chronic) (peripheral): Secondary | ICD-10-CM | POA: Diagnosis not present

## 2022-04-22 DIAGNOSIS — L942 Calcinosis cutis: Secondary | ICD-10-CM | POA: Diagnosis not present

## 2022-04-22 DIAGNOSIS — L97522 Non-pressure chronic ulcer of other part of left foot with fat layer exposed: Secondary | ICD-10-CM | POA: Diagnosis not present

## 2022-04-22 NOTE — Progress Notes (Signed)
[name], [name] ([id number]) Visit Report for 04/22/2022 Chief Complaint Document Details Patient Name: Date of Service: [name], [name] 04/22/2022 2:00 PM Medical Record Number: [id number] Patient Account Number: [id number] Date of Birth/Sex: Treating RN: [birth year] (71 y.o. M) Primary Care Provider: PA [name], NO Other Clinician: Referring Provider: Treating Provider/Extender: [name] in Treatment: 10 Information Obtained from: Patient Chief Complaint Patient seen for complaints of Non-Healing Wounds. Electronic Signature(s) Signed: 04/22/2022 2:38:16 PM By: Fredirick Maudlin MD FACS Entered By: Fredirick Maudlin on 04/22/2022 14:38:16 -------------------------------------------------------------------------------- Debridement Details Patient Name: Date of Service: [name] 04/22/2022 2:00 PM Medical Record Number: [id number] Patient Account Number: [id number] Date of Birth/Sex: Treating RN: 1950/12/22 (71 y.o. [name] Primary Care Provider: PA [name], [place] Other Clinician: Referring Provider: Treating Provider/Extender: [name] in Treatment: 10 Debridement Performed for Assessment: Wound #1 Left,Dorsal Foot Performed By: Physician Fredirick Maudlin, MD Debridement Type: Debridement Level of Consciousness (Pre-procedure): Awake and Alert Pre-procedure Verification/Time Out Yes - 14:25 Taken: Start Time: 14:25 Pain Control: Lidocaine 4% T opical Solution T Area Debrided (L x W): otal 1.8 (cm) x 2 (cm) = 3.6 (cm) Tissue and other material debrided: Non-Viable, Slough, Slough Level: Non-Viable Tissue Debridement Description: Selective/Open Wound Instrument: Curette Bleeding: Minimum Hemostasis Achieved: Pressure Procedural Pain: 3 Post Procedural Pain: 1 Response to Treatment: Procedure was tolerated well Level of Consciousness (Post- Awake and Alert procedure): Post Debridement Measurements of Total Wound Length:  (cm) 1.8 Width: (cm) 2 Depth: (cm) 0.2 Volume: (cm) 0.565 Character of Wound/Ulcer Post Debridement: Improved Post Procedure Diagnosis Same as Pre-procedure Notes scribed by Baruch Gouty, RN for Dr. [name] Electronic Signature(s) Signed: 04/22/2022 2:53:26 PM By: Fredirick Maudlin MD FACS Signed: 04/22/2022 5:24:26 PM By: Baruch Gouty RN, BSN Entered By: Baruch Gouty on 04/22/2022 14:29:06 -------------------------------------------------------------------------------- Debridement Details Patient Name: Date of Service: [name] 04/22/2022 2:00 PM Medical Record Number: [id number] Patient Account Number: [id number] Date of Birth/Sex: Treating RN: 20-Nov-1950 (71 y.o. [name] Primary Care Provider: PA [name], [place] Other Clinician: Referring Provider: Treating Provider/Extender: [name] in Treatment: 10 Debridement Performed for Assessment: Wound #3 Right,Medial Lower Leg Performed By: Physician Fredirick Maudlin, MD Debridement Type: Debridement Level of Consciousness (Pre-procedure): Awake and Alert Pre-procedure Verification/Time Out Yes - 14:25 Taken: Pain Control: Lidocaine 4% T opical Solution T Area Debrided (L x W): otal 2.9 (cm) x 3.3 (cm) = 9.57 (cm) Tissue and other material debrided: Viable, Non-Viable, Slough, Subcutaneous, Slough, Other: calcinosis Level: Skin/Subcutaneous Tissue Debridement Description: Excisional Instrument: Curette Bleeding: Minimum Hemostasis Achieved: Pressure Procedural Pain: 3 Post Procedural Pain: 1 Response to Treatment: Procedure was tolerated well Level of Consciousness (Post- Awake and Alert procedure): Post Debridement Measurements of Total Wound Length: (cm) 2.9 Width: (cm) 3.3 Depth: (cm) 0.9 Volume: (cm) 6.765 Character of Wound/Ulcer Post Debridement: Improved Post Procedure Diagnosis Same as Pre-procedure Notes scribed by Baruch Gouty, RN for Dr. [name] Electronic  Signature(s) Signed: 04/22/2022 2:53:26 PM By: Fredirick Maudlin MD FACS Signed: 04/22/2022 5:24:26 PM By: Baruch Gouty RN, BSN Entered By: Baruch Gouty on 04/22/2022 14:29:17 -------------------------------------------------------------------------------- HPI Details Patient Name: Date of Service: [name] 04/22/2022 2:00 PM Medical Record Number: [id number] Patient Account Number: [id number] Date of Birth/Sex: Treating RN: 1951-02-25 (71 y.o. M) Primary Care Provider: PA [name], NO Other Clinician: Referring Provider: Treating Provider/Extender: [name] in Treatment: 10 History of Present Illness HPI Description: ADMISSION 02/09/2022 This is a 71 year old man who recently moved to [place] from [place]. He has a history of of coronary artery disease and prior left popliteal vein DVT . He is not diabetic and does not smoke. 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 He has had multiple spinal surgeries and has limited mobility. He primarily uses a motorized wheelchair. He has had lymphedema for many years and does have lymphedema pumps although he says he does not use them frequently. He has wounds on his bilateral lower extremities. He was receiving care in [place] for these prior to his move. They were apparently packing his wounds with Betadine soaked gauze. He has worn compression wraps in the past but not recently. He did say that they helped tremendously. In clinic today, his right ABI is noncompressible but has good signals; the left ABI was 1.17. No formal vascular studies have been performed. On his left dorsal foot, there is a circular lesion that exposes the fat layer. There is fairly heavy slough accumulation within the wound, but no significant odor. On his right medial calf, he has multiple pinholes that have slough buildup in them and probe for about 2 to 4 mm in depth. They are draining clear fluid. On his right lateral  midfoot, he has ulceration consistent with venous stasis. It is geographic and has slough accumulation. 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 02/17/2022: All of the wounds are little bit larger today and have more slough accumulation. Today, the medial calf openings are larger and probe to something that feels calcified. There is odor coming from his wounds. His vascular studies are scheduled for August 9 and 10. 02-24-2022 upon evaluation today patient presents for follow-up concerning his ongoing issues here with his lower extremities. With that being said he does have significant problems with what appears to be cellulitis unfortunately the PCR culture that was sent last week got crushed by UPS in transit. With that being said we are going to reobtain a culture today although I am actually to do it on the right medial leg as this seems to be the most inflamed area today where there is some questionable drainage as well. I am going to remove some of the calcium deposits which are loosening obtain a deeper culture from this area. Fortunately I do not see any evidence of active infection systemically which is good news but locally I think we are having a bigger issue here. He does have his appointment next Thursday with vascular. Patient has also been referred to [facility] for further evaluation and treatment of the calcinosis for possible sulfate injections. 03/04/2022: The culture that was performed last week grew out multiple species including Klebsiella, Morganella, and Pseudomonas aeruginosa. He had been prescribed Bactrim but this was not adequate to cover all of the species and levofloxacin was recommended. He completed the Bactrim but did not pick up the levofloxacin and begin taking it until yesterday. 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 He had arterial studies performed today. He does have evidence of peripheral arterial disease. Results copied here: +-------+-----------+-----------+------------+------------+ ABI/TBIT oday's ABIT oday's TBIPrevious ABIPrevious TBI +-------+-----------+-----------+------------+------------+ Right [place] 0.75    +-------+-----------+-----------+------------+------------+ Left [place] 0.57    +-------+-----------+-----------+------------+------------+ Arterial wall calcification precludes accurate ankle pressures and ABIs. Summary: Right: Resting right ankle-brachial index indicates noncompressible right  lower extremity arteries. The right toe-brachial index is normal. Left: Resting left ankle-brachial index indicates noncompressible left lower extremity arteries. The left toe-brachial index is abnormal. He continues to have further tissue breakdown at the right medial calf and there is ample slough accumulation along with necrotic subcutaneous tissue. The left dorsal foot wound has built up slough, as well. The right medial foot wound is nearly closed with just a light layer of eschar over the surface. The right dorsal lateral foot wound has also accumulated a fair amount of slough and remains quite tender. 03/10/2022: He has been taking the prescribed levofloxacin and has about 3 days left of this. The erythema and induration on his right leg has improved. He continues to experience further breakdown of the right medial calf wound. There is extensive slough and debris accumulation there. There is heavy slough on his left dorsal foot wound, but no odor or purulent drainage. The right medial foot wound appears to be closed. The right dorsal lateral foot wound also has a fair amount of slough but it is less tender than at our last visit. He still does not have an appointment with  dermatology. 03/22/2022: The right anterior tibial wound has closed. The right lateral foot wound is nearly closed with just a little bit of slough. The left dorsal foot wound is smaller and continues to have some slough buildup but less so than at prior visits. There is good granulation tissue underlying the slough. Unfortunately the right medial calf wound continues to deteriorate. It has a foul odor today and a lot of necrotic tissue, the surrounding skin is also erythematous and moist. 03/30/2022: The right lateral foot wound continues to contract and just has a little bit of slough and eschar present. The left dorsal foot wound is also smaller with slough overlying good granulation tissue. The right medial calf wound actually looks quite a bit better today; there is no odor and there is much less necrotic tissue although some remains present. We have been using topical gentamicin and he has been taking oral Augmentin. 04/07/2022: The patient saw dermatology at [facility] last week. Unfortunately, it appears that they did not read any of the documentation that was provided. 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 Overall, it was a highly unsatisfactory consultation. In the meantime, however, we were able to identify compounding pharmacy who could create a topical sodium thiosulfate ointment. The patient has that with him today. Both of the right foot wounds are now closed. The left dorsal foot wound has contracted but still has a layer of slough on the surface. The medial calf wounds on the right all look a little bit better. They still have heavy slough along with the chunks of calcium. Everything is stained purple. 04/14/2022: The  wound on his dorsal left foot is a little bit smaller again today. There is still slough accumulation on the surface. The medial calf wounds have quite a bit less slough accumulation today. His right leg, however, is warm and erythematous, concerning for cellulitis. 04/14/2022: He completed his course of Augmentin yesterday. The medial calf wound sites are much cleaner today and shallower. There is less fragmented calcium in the wounds. He has a lot of lymphedema fluid-tight drainage from these  wounds, but no purulent discharge or malodor. The wound on his dorsal left foot has also contracted and is shallower. There is a layer of slough over healthy granulation tissue. Electronic Signature(s) Signed: 04/22/2022 2:42:25 PM By: Fredirick Maudlin MD FACS Entered By: Fredirick Maudlin on 04/22/2022 14:42:25 -------------------------------------------------------------------------------- Physical Exam Details Patient Name: Date of Service: [name] 04/22/2022 2:00 PM Medical Record Number: [id number] Patient Account Number: [id number] Date of Birth/Sex: Treating RN: May 01, 1951 (71 y.o. M) Primary Care Provider: PA [name], NO Other Clinician: Referring Provider: Treating Provider/Extender: [name], Johann Capers Weeks in Treatment: 10 Constitutional . . . . No acute distress.Marland Kitchen Respiratory Normal work of breathing on room air.. Notes 04/14/2022: The medial calf wound sites are much cleaner today and shallower. There is less fragmented calcium in the wounds. He has a lot of lymphedema fluid-like drainage from these wounds, but no purulent discharge or malodor. The wound on his dorsal left foot has also contracted and is shallower. There is a layer of slough over healthy granulation tissue. Electronic Signature(s) Signed: 04/22/2022 2:43:09 PM By: Fredirick Maudlin MD FACS Entered By: Fredirick Maudlin on 04/22/2022  14:43:09 -------------------------------------------------------------------------------- Physician Orders Details Patient Name: Date of Service: [name] 04/22/2022 2:00 PM Medical Record Number: [id number] Patient Account Number: [id number] Date of Birth/Sex: Treating RN: Aug 13, 1950 (71 y.o. 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 - Dr. [name] Rm 1 Return A Anesthetic Wound #1 Left,Dorsal Foot (In clinic) Topical Lidocaine 4% applied to wound bed Wound #3 Right,Medial Lower Leg (In clinic) Topical Lidocaine 4% applied to wound bed Bathing/ Shower/ Hygiene May shower with protection but do not get wound dressing(s) wet. Edema Control - Lymphedema / SCD / Other Lymphedema Pumps. Use Lymphedema pumps on leg(s) 2-3 times a day for 45-60 minutes. If wearing any wraps or hose, do not remove them. Continue exercising as instructed. - use 1-2 times per day over wraps Elevate legs to the level of the heart or above for 30 minutes daily and/or when sitting, a frequency of: Exercise regularly Wound Treatment Wound #1 - Foot Wound Laterality: Dorsal, Left Peri-Wound Care: Triamcinolone 15 (g) 1 x Per Week Discharge Instructions: Use triamcinolone 15 (g) as  directed Peri-Wound Care: Zinc Oxide Ointment 30g tube 1 x Per Week Discharge Instructions: Apply Zinc Oxide to periwound with each dressing change Peri-Wound Care: Sween Lotion (Moisturizing lotion) 1 x Per Week Discharge Instructions: Apply moisturizing lotion as directed Topical: Sodium Thiosulfate 1 x Per Week Prim Dressing: KerraCel Ag Gelling Fiber Dressing, 4x5 in (silver alginate) 1 x Per Week ary Discharge Instructions: Apply silver alginate to wound bed as instructed Secondary Dressing: Woven Gauze Sponge, Non-Sterile 4x4 in 1 x Per Week Discharge Instructions: Apply over primary dressing as directed. Secondary Dressing: Zetuvit Plus 4x8 in 1 x Per Week Discharge Instructions: Apply over primary dressing as directed. Compression Wrap: ThreePress (3 layer compression wrap) 1 x Per Week Discharge Instructions: Apply  three layer compression as directed. Wound #3 - Lower Leg Wound Laterality: Right, Medial Peri-Wound Care: Ketoconazole Cream 2% 1 x Per Week Discharge Instructions: Apply Ketoconazole to periwoiund rash Peri-Wound Care: Triamcinolone 15 (g) 1 x Per Week Discharge Instructions: Use triamcinolone 15 (g) as directed Peri-Wound Care: Sween Lotion (Moisturizing lotion) 1 x Per Week Discharge Instructions: Apply moisturizing lotion as directed Prim Dressing: KerraCel Ag Gelling Fiber Dressing, 4x5 in (silver alginate) 1 x Per Week ary Discharge Instructions: Apply silver alginate to wound bed as instructed Secondary Dressing: ABD Pad, 8x10 1 x Per Week Discharge Instructions: Apply over primary dressing as directed. Secondary Dressing: Woven Gauze Sponge, Non-Sterile 4x4 in 1 x Per Week Discharge Instructions: Apply over primary dressing as directed. Compression Wrap: ThreePress (3 layer compression wrap) 1 x Per Week Discharge Instructions: Apply three layer compression as directed. Electronic Signature(s) Signed: 04/22/2022 2:53:26 PM By: Fredirick Maudlin MD  FACS Entered By: Fredirick Maudlin on 04/22/2022 14:43:38 -------------------------------------------------------------------------------- Problem List Details Patient Name: Date of Service: [name] 04/22/2022 2:00 PM Medical Record Number: [id number] Patient Account Number: [id number] Date of Birth/Sex: Treating RN: May 12, 1951 (71 y.o. 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 02/09/2022 No Yes 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) 02/09/2022 No Yes I10 Essential (primary) hypertension 02/09/2022 No Yes I25.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 02/09/2022 No Yes I89.0 Lymphedema, not elsewhere classified 02/09/2022 No Yes Z86.718 Personal history of other venous thrombosis and embolism 02/09/2022 No Yes Inactive Problems ICD-10 Code Description Active Date Inactive Date L97.512 Non-pressure chronic ulcer of other part of right foot with fat layer exposed 02/17/2022 02/17/2022 Resolved Problems Electronic Signature(s) Signed: 04/22/2022 2:38:03 PM By: Fredirick Maudlin MD FACS Entered By: Fredirick Maudlin on 04/22/2022 14:38:03 -------------------------------------------------------------------------------- Progress Note Details Patient Name: Date of Service: [name] 04/22/2022 2:00 PM Medical Record Number: [id number] Patient Account Number: [id number] Date of Birth/Sex: Treating RN: [birth year] (71 y.o. M) Primary Care Provider: PA [name], NO Other Clinician: Referring  Provider: Treating Provider/Extender: [name] in Treatment: 10 Subjective Chief Complaint Information obtained from Patient Patient seen for complaints of Non-Healing Wounds. History of Present Illness (HPI) ADMISSION 02/09/2022 This is a 71 year old man who recently moved to [place] from [place]. He has a history of of coronary artery disease and prior left popliteal vein DVT . He is not diabetic and does not smoke. 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 He has had multiple spinal surgeries and has limited mobility. He primarily uses a motorized wheelchair. He has had lymphedema for many years and does have lymphedema pumps although he says he does not use them frequently. He has wounds on his bilateral lower extremities. He was receiving care in [place] for these prior to his move. They were apparently packing his wounds with Betadine soaked gauze. He has worn compression wraps in the past but not recently. He did say that they helped tremendously. In clinic today, his right ABI is noncompressible but has good signals; the left ABI was 1.17. No formal vascular studies have been performed. On his left dorsal foot,  there is a circular lesion that exposes the fat layer. There is fairly heavy slough accumulation within the wound, but no significant odor. On his right medial calf, he has multiple pinholes that have slough buildup in them and probe for about 2 to 4 mm in depth. They are draining clear fluid. On his right lateral midfoot, he has ulceration consistent with venous stasis. It is geographic and has slough accumulation. 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 02/17/2022: All of the wounds are little bit larger today and have more slough accumulation. Today, the medial calf openings are larger and probe to something that feels calcified. There is  odor coming from his wounds. His vascular studies are scheduled for August 9 and 10. 02-24-2022 upon evaluation today patient presents for follow-up concerning his ongoing issues here with his lower extremities. With that being said he does have significant problems with what appears to be cellulitis unfortunately the PCR culture that was sent last week got crushed by [employer] in transit. With that being said we are going to reobtain a culture today although I am actually to do it on the right medial leg as this seems to be the most inflamed area today where there is some questionable drainage as well. I am going to remove some of the calcium deposits which are loosening obtain a deeper culture from this area. Fortunately I do not see any evidence of active infection systemically which is good news but locally I think we are having a bigger issue here. He does have his appointment next Thursday with vascular. Patient has also been referred to [facility] for further evaluation and treatment of the calcinosis for possible sulfate injections. 03/04/2022: The culture that was performed last week grew out multiple species including Klebsiella, Morganella, and Pseudomonas aeruginosa. He had been prescribed Bactrim but this was not adequate to cover all of the species and levofloxacin was recommended. He completed the Bactrim but did not pick up the levofloxacin and begin taking it until yesterday. We referred him to dermatology at 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 He had arterial studies performed today. He does have evidence of peripheral arterial disease. Results copied here: +-------+-----------+-----------+------------+------------+ ABI/TBIT oday's ABIT oday's TBIPrevious ABIPrevious TBI +-------+-----------+-----------+------------+------------+ Right [place]  0.75    +-------+-----------+-----------+------------+------------+ Left [place] 0.57    +-------+-----------+-----------+------------+------------+ Arterial wall calcification precludes accurate ankle pressures and ABIs. Summary: Right: Resting right ankle-brachial index indicates noncompressible right lower extremity arteries. The right toe-brachial index is normal. Left: Resting left ankle-brachial index indicates noncompressible left lower extremity arteries. The left toe-brachial index is abnormal. He continues to have further tissue breakdown at the right medial calf and there is ample slough accumulation along with necrotic subcutaneous tissue. The left dorsal foot wound has built up slough, as well. The right medial foot wound is nearly closed with just a light layer of eschar over the surface. The right dorsal lateral foot wound has also accumulated a fair amount of slough and remains quite tender. 03/10/2022: He has been taking the prescribed levofloxacin and has about 3 days left of this. The erythema and induration on his right leg has improved. He continues to experience further breakdown of the right medial calf wound. There is extensive slough and debris accumulation there. There is heavy slough on his left dorsal foot wound,  but no odor or purulent drainage. The right medial foot wound appears to be closed. The right dorsal lateral foot wound also has a fair amount of slough but it is less tender than at our last visit. He still does not have an appointment with dermatology. 03/22/2022: The right anterior tibial wound has closed. The right lateral foot wound is nearly closed with just a little bit of slough. The left dorsal foot wound is smaller and continues to have some slough buildup but less so than at prior visits. There is good granulation tissue underlying the slough. Unfortunately the right medial calf wound continues to deteriorate. It has a foul odor today and a lot of  necrotic tissue, the surrounding skin is also erythematous and moist. 03/30/2022: The right lateral foot wound continues to contract and just has a little bit of slough and eschar present. The left dorsal foot wound is also smaller with slough overlying good granulation tissue. The right medial calf wound actually looks quite a bit better today; there is no odor and there is much less necrotic tissue although some remains present. We have been using topical gentamicin and he has been taking oral Augmentin. 04/07/2022: The patient saw dermatology at [facility] last week. Unfortunately, it appears that they did not read any of the documentation that was provided. 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 Overall, it was a highly unsatisfactory consultation. In the meantime, however, we were able to identify compounding pharmacy who could create a topical sodium thiosulfate ointment. The patient has that with him today. Both of the right foot wounds are now closed. The left dorsal foot wound has contracted but still has a layer of slough on the surface. The medial calf wounds on the right all look a little bit better. They still have heavy slough along with the chunks of calcium. Everything is stained purple. 04/14/2022: The wound on his dorsal left foot is a little bit smaller again today. There is still slough accumulation on the surface. The medial calf wounds have quite a bit less slough accumulation today. His right leg, however, is warm and erythematous, concerning for cellulitis. 04/14/2022: He completed his course of Augmentin yesterday. The medial calf wound sites are much cleaner today and shallower. There is less fragmented calcium  in the wounds. He has a lot of lymphedema fluid-tight drainage from these wounds, but no purulent discharge or malodor. The wound on his dorsal left foot has also contracted and is shallower. There is a layer of slough over healthy granulation tissue. Patient History Information obtained from Patient, Chart. Family History Cancer - Mother, Heart Disease - Father,Mother, Hypertension - Father,Siblings, Lung Disease - Siblings, No family history of Diabetes, Hereditary Spherocytosis, Kidney Disease, Seizures, Stroke, Thyroid Problems, Tuberculosis. Social History Former smoker - quit 1991, Marital Status - Married, Alcohol Use - Moderate, Drug Use - No History, Caffeine Use - Daily - coffee. Medical History Eyes Patient has history of Cataracts - right eye removed Denies history of Glaucoma, Optic Neuritis Ear/Nose/Mouth/Throat Denies history of Chronic sinus problems/congestion, Middle ear problems Hematologic/Lymphatic Patient has history of Anemia - macrocytic, Lymphedema Cardiovascular Patient has history of Congestive Heart Failure, Deep Vein Thrombosis - left leg, Hypertension, Peripheral Venous Disease Endocrine Denies history of  Type I Diabetes, Type II Diabetes Genitourinary Denies history of End Stage Renal Disease Integumentary (Skin) Denies history of History of Burn Oncologic Denies history of Received Chemotherapy, Received Radiation Psychiatric Denies history of Anorexia/bulimia, Confinement Anxiety Hospitalization/Surgery History - cervical fusion. - lumbar surgery. - coronary stent placement. - lasik eye surgery. - hydrocele excision. Medical A Surgical History Notes nd Constitutional Symptoms (General Health) obesity Ear/Nose/Mouth/Throat hard of hearing Respiratory frozen diaphragm right Cardiovascular hyperlipidemia Musculoskeletal myositis, lumbar DDD, spinal stenosis, cervical facet joint syndrome Objective Constitutional No acute distress.. Vitals  Time Taken: 1:54 PM, Temperature: 98.3 F, Pulse: 73 bpm, Respiratory Rate: 18 breaths/min, Blood Pressure: 123/73 mmHg. Respiratory Normal work of breathing on room air.. General Notes: 04/14/2022: The medial calf wound sites are much cleaner today and shallower. There is less fragmented calcium in the wounds. He has a lot of lymphedema fluid-like drainage from these wounds, but no purulent discharge or malodor. The wound on his dorsal left foot has also contracted and is shallower. There is a layer of slough over healthy granulation tissue. Integumentary (Hair, Skin) Wound #1 status is Open. Original cause of wound was Gradually Appeared. The date acquired was: 12/07/2021. The wound has been in treatment 10 weeks. The wound is located on the Left,Dorsal Foot. The wound measures 1.8cm length x 2cm width x 0.2cm depth; 2.827cm^2 area and 0.565cm^3 volume. There is Fat Layer (Subcutaneous Tissue) exposed. There is no tunneling or undermining noted. There is a medium amount of serosanguineous drainage noted. The wound margin is distinct with the outline attached to the wound base. There is small (1-33%) red granulation within the wound bed. There is a large (67-100%) amount of necrotic tissue within the wound bed including Adherent Slough. Wound #3 status is Open. Original cause of wound was Gradually Appeared. The date acquired was: 12/07/2021. The wound has been in treatment 10 weeks. The wound is located on the Right,Medial Lower Leg. The wound measures 2.9cm length x 3.3cm width x 0.9cm depth; 7.516cm^2 area and 6.765cm^3 volume. There is Fat Layer (Subcutaneous Tissue) exposed. There is no tunneling or undermining noted. There is a large amount of serosanguineous drainage noted. The wound margin is distinct with the outline attached to the wound base. There is large (67-100%) red granulation within the wound bed. There is a small (1-33%) amount of necrotic tissue within the wound bed including  Adherent Slough. 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 There was a Selective/Open Wound Non-Viable Tissue Debridement with a total area of 3.6 sq cm performed by Fredirick Maudlin, MD. With the following instrument(s): Curette to remove Non-Viable tissue/material. Material removed includes [facility] and Other: calcinosis after achieving pain control using Lidocaine 4% Topical Solution. No specimens were taken. A time out was conducted at 14:25, prior to the start of the procedure. A Minimum amount of bleeding was controlled with Pressure. The procedure was tolerated well with a pain level of 3 throughout and a pain level of 1 following the procedure. Post Debridement Measurements: 1.8cm length x 2cm width x 0.2cm depth; 0.565cm^3 volume. Character of Wound/Ulcer Post Debridement  is improved. Post procedure Diagnosis Wound #1: Same as Pre-Procedure General Notes: scribed by Baruch Gouty, RN for Dr. [name]. Pre-procedure diagnosis of Wound #1 is a Lymphedema located on the Left,Dorsal Foot . There was a Three Layer Compression Therapy Procedure by Baruch Gouty, RN. Post procedure Diagnosis Wound #1: Same as Pre-Procedure Wound #3 Pre-procedure diagnosis of Wound #3 is a Lymphedema located on the Right,Medial Lower Leg . There was a Excisional Skin/Subcutaneous Tissue Debridement with a total area of 9.57 sq cm performed by Fredirick Maudlin, MD. With the following  instrument(s): Curette to remove Viable and Non-Viable tissue/material. Material removed includes Subcutaneous Tissue, Slough, and Other: calcinosis after achieving pain control using Lidocaine 4% Topical Solution. No specimens were taken. A time out was conducted at 14:25, prior to the start of the procedure. A Minimum amount of bleeding was controlled with Pressure. The procedure was tolerated well with a pain level of 3 throughout and a pain level of 1 following the procedure. Post Debridement Measurements: 2.9cm length x 3.3cm width x 0.9cm depth; 6.765cm^3 volume. Character of Wound/Ulcer Post Debridement is improved. Post procedure Diagnosis Wound #3: Same as Pre-Procedure General Notes: scribed by Baruch Gouty, RN for Dr. [name]. Pre-procedure diagnosis of Wound #3 is a Lymphedema located on the Right,Medial Lower Leg . There was a Three Layer Compression Therapy Procedure by Baruch Gouty, RN. Post procedure Diagnosis Wound #3: Same as Pre-Procedure Plan Follow-up Appointments: Return Appointment in 1 week. - Dr. [name] Rm 1 Anesthetic: Wound #1 Left,Dorsal Foot: (In clinic) Topical Lidocaine 4% applied to wound bed Wound #3 Right,Medial Lower Leg: (In clinic) Topical Lidocaine 4% applied to wound bed Bathing/ Shower/ Hygiene: May shower with protection but do not get wound dressing(s) wet. Edema Control - Lymphedema / SCD / Other: Lymphedema Pumps. Use Lymphedema pumps on leg(s) 2-3 times a day for 45-60 minutes. If wearing any wraps or hose, do not remove them. Continue exercising as instructed. - use 1-2 times per day over wraps Elevate legs to the level of the heart or above for 30 minutes daily and/or when sitting, a frequency of: Exercise regularly WOUND #1: - Foot Wound Laterality: Dorsal, Left Peri-Wound Care: Triamcinolone 15 (g) 1 x Per Week/ Discharge Instructions: Use triamcinolone 15 (g) as directed Peri-Wound Care: Zinc Oxide Ointment 30g tube 1 x Per  Week/ Discharge Instructions: Apply Zinc Oxide to periwound with each dressing change Peri-Wound Care: Sween Lotion (Moisturizing lotion) 1 x Per Week/ Discharge Instructions: Apply moisturizing lotion as directed Topical: Sodium Thiosulfate 1 x Per Week/ Prim Dressing: KerraCel Ag Gelling Fiber Dressing, 4x5 in (silver alginate) 1 x Per Week/ ary Discharge Instructions: Apply silver alginate to wound bed as instructed Secondary Dressing: Woven Gauze Sponge, Non-Sterile 4x4 in 1 x Per Week/ Discharge Instructions: Apply over primary dressing as directed. Secondary Dressing: Zetuvit Plus 4x8 in 1 x Per Week/ Discharge Instructions: Apply over primary dressing as directed. Com pression Wrap: ThreePress (3 layer compression wrap) 1 x Per Week/ Discharge Instructions: Apply three layer compression as directed. WOUND #3: - Lower Leg Wound Laterality: Right, Medial Peri-Wound Care: Ketoconazole Cream 2% 1 x Per Week/ Discharge Instructions: Apply Ketoconazole to periwoiund rash Peri-Wound Care: Triamcinolone 15 (g) 1 x Per Week/ Discharge Instructions: Use triamcinolone 15 (g) as directed Peri-Wound Care: Sween Lotion (Moisturizing lotion) 1 x Per Week/ Discharge Instructions: Apply moisturizing lotion as directed Prim Dressing: KerraCel Ag Gelling Fiber Dressing, 4x5 in (silver alginate) 1 x Per Week/ ary Discharge Instructions: Apply silver alginate to  wound bed as instructed Secondary Dressing: ABD Pad, 8x10 1 x Per Week/ Discharge Instructions: Apply over primary dressing as directed. Secondary Dressing: Woven Gauze Sponge, Non-Sterile 4x4 in 1 x Per Week/ Discharge Instructions: Apply over primary dressing as directed. Com pression Wrap: ThreePress (3 layer compression wrap) 1 x Per Week/ Discharge Instructions: Apply three layer compression as directed. 04/14/2022: The medial calf wound sites are much cleaner today and shallower. There is less fragmented calcium in the wounds. He has a  lot of lymphedema fluid-like drainage from these wounds, but no purulent discharge or malodor. The wound on his dorsal left foot has also contracted and is shallower. There is a layer of slough over healthy granulation tissue. I used a curette to debride slough and nonviable subcutaneous tissue from his medial calf wounds on the right. I debrided slough off of his dorsal foot wound. We will continue to use his compounded topical sodium thiosulfate on his right medial calf with silver alginate to both wounds. 3 layer compression bilaterally. Follow-up in 1 week. Electronic Signature(s) Signed: 04/22/2022 2:44:28 PM By: Fredirick Maudlin MD FACS Entered By: Fredirick Maudlin on 04/22/2022 14:44:28 -------------------------------------------------------------------------------- HxROS Details Patient Name: Date of Service: [name] 04/22/2022 2:00 PM Medical Record Number: [id number] Patient Account Number: [id number] Date of Birth/Sex: Treating RN: [birth year] (71 y.o. M) Primary Care Provider: PA [name], NO Other Clinician: Referring Provider: Treating Provider/Extender: [name] in Treatment: 10 Information Obtained From Patient Chart Constitutional Symptoms (General Health) Medical History: Past Medical History Notes: obesity Eyes Medical History: Positive for: Cataracts - right eye removed Negative for: Glaucoma; Optic Neuritis Ear/Nose/Mouth/Throat Medical History: Negative for: Chronic sinus problems/congestion; Middle ear problems Past Medical History Notes: hard of hearing Hematologic/Lymphatic Medical History: Positive for: Anemia - macrocytic; Lymphedema Respiratory Medical History: Past Medical History Notes: frozen diaphragm right Cardiovascular Medical History: Positive for: Congestive Heart Failure; Deep Vein Thrombosis - left leg; Hypertension; Peripheral Venous Disease Past Medical History Notes: hyperlipidemia Endocrine Medical  History: Negative for: Type I Diabetes; Type II Diabetes Genitourinary Medical History: Negative for: End Stage Renal Disease Integumentary (Skin) Medical History: Negative for: History of Burn Musculoskeletal Medical History: Past Medical History Notes: myositis, lumbar DDD, spinal stenosis, cervical facet joint syndrome Oncologic Medical History: Negative for: Received Chemotherapy; Received Radiation Psychiatric Medical History: Negative for: Anorexia/bulimia; Confinement Anxiety HBO Extended History Items Eyes: Cataracts Immunizations Pneumococcal Vaccine: Received Pneumococcal Vaccination: Yes Received Pneumococcal Vaccination On or After 60th Birthday: Yes Implantable Devices No devices added Hospitalization / Surgery History Type of Hospitalization/Surgery cervical fusion lumbar surgery coronary stent placement lasik eye surgery hydrocele excision Family and Social History Cancer: Yes - Mother; Diabetes: No; Heart Disease: Yes - Father,Mother; Hereditary Spherocytosis: No; Hypertension: Yes - Father,Siblings; Kidney Disease: No; Lung Disease: Yes - Siblings; Seizures: No; Stroke: No; Thyroid Problems: No; Tuberculosis: No; Former smoker - quit 1991; Marital Status - Married; Alcohol Use: Moderate; Drug Use: No History; Caffeine Use: Daily - coffee; Financial Concerns: No; Food, Clothing or Shelter Needs: No; Support System Lacking: No; Transportation Concerns: No Electronic Signature(s) Signed: 04/22/2022 2:53:26 PM By: Fredirick Maudlin MD FACS Entered By: Fredirick Maudlin on 04/22/2022 14:42:30 -------------------------------------------------------------------------------- SuperBill Details Patient Name: Date of Service: SEYDINA, HOLLIMAN 04/22/2022 Medical Record Number: [id number] Patient Account Number: [id number] Date of Birth/Sex: Treating RN: [birth year] (71 y.o. M) Primary Care Provider: PA Darnelle Spangle Other Clinician: Referring Provider: Treating  Provider/Extender: [name] in Treatment: 10 Diagnosis Coding ICD-10 Codes Code Description [phone] Non-pressure  chronic ulcer of 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d L97.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) I10 Essential (primary) hypertension I25.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 I89.0 Lymphedema, not elsewhere classified Z86.718 Personal history of other venous thrombosis and embolism Facility Procedures CPT4 Code: [id number] Description: 94765 - DEB SUBQ TISSUE 20 SQ CM/< ICD-10 Diagnosis Description Y65.035 Non-pressure chronic ulcer of other part of right lower leg with fat layer expos Modifier: ed Quantity: 1 CPT4 Code: [id number] Description: 51700 - DEBRIDE WOUND 1ST 20 SQ CM OR < ICD-10 Diagnosis Description L97.522 Non-pressure chronic ulcer of other part of left foot with fat layer exposed Modifier: Quantity: 1 Physician Procedures : CPT4 Code Description Modifier [id number] - WC PHYS LEVEL 4 - EST PT 25 ICD-10 Diagnosis Description M38.466 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d L97.522 Non-pressure chronic ulcer of other part of left  foot with fat layer exposed 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) Quantity: 1 : [id number] 11042 - WC PHYS SUBQ TISS 20 SQ CM ICD-10 Diagnosis Description V77.939 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d Quantity: 1 Electronic Signature(s) Signed: 04/22/2022 2:44:51 PM By: Fredirick Maudlin MD FACS Entered By: Fredirick Maudlin on 04/22/2022 14:44:51

## 2022-04-22 NOTE — Progress Notes (Signed)
OAKLAN, PERSONS ([id number]) Visit Report for 04/22/2022 Arrival Information Details Patient Name: Date of Service: [name] 04/22/2022 2:00 PM Medical Record Number: [id number] Patient Account Number: [id number] Date of Birth/Sex: Treating RN: [birth year] (71 y.o. [name] Primary Care Marguriete Wootan: PA [name], [place] Other Clinician: Referring Lyncoln Ledgerwood: Treating Nielle Duford/Extender: [name] in Treatment: 10 Visit Information History Since Last Visit Added or deleted any medications: No Patient Arrived: Wheel Chair Any new allergies or adverse reactions: No Arrival Time: 13:53 Had a fall or experienced change in No Accompanied By: self activities of daily living that may affect Transfer Assistance: None risk of falls: Patient Identification Verified: Yes Signs or symptoms of abuse/neglect since last visito No Secondary Verification Process Completed: Yes Hospitalized since last visit: No Patient Requires Transmission-Based Precautions: No Implantable device outside of the clinic excluding No Patient Has Alerts: Yes cellular tissue based products placed in the center Patient Alerts: R ABI= N/C, TBI= .75 since last visit: L ABI =N/C, TBI = .57 Has Dressing in Place as Prescribed: Yes Has Compression in Place as Prescribed: Yes Pain Present Now: No Electronic Signature(s) Signed: 04/22/2022 5:24:26 PM By: Baruch Gouty RN, BSN Entered By: Baruch Gouty on 04/22/2022 13:54:35 -------------------------------------------------------------------------------- Compression Therapy Details Patient Name: Date of Service: [name] 04/22/2022 2:00 PM Medical Record Number: [id number] Patient Account Number: [id number] Date of Birth/Sex: Treating RN: [birth year] (71 y.o. [name] Primary Care Alese Furniss: PA [name], [place] Other Clinician: Referring Zaniel Marineau: Treating Bruno Leach/Extender: [name] in Treatment: 10 Compression  Therapy Performed for Wound Assessment: Wound #3 Right,Medial Lower Leg Performed By: Clinician Baruch Gouty, RN Compression Type: Three Layer Post Procedure Diagnosis Same as Pre-procedure Electronic Signature(s) Signed: 04/22/2022 5:24:26 PM By: Baruch Gouty RN, BSN Entered By: Baruch Gouty on 04/22/2022 14:21:40 -------------------------------------------------------------------------------- Compression Therapy Details Patient Name: Date of Service: [name] 04/22/2022 2:00 PM Medical Record Number: [id number] Patient Account Number: [id number] Date of Birth/Sex: Treating RN: 1950/09/05 (71 y.o. [name] Primary Care Gianpaolo Mindel: PA [name], [place] Other Clinician: Referring Dolphus Linch: Treating Zarin Knupp/Extender: [name] in Treatment: 10 Compression Therapy Performed for Wound Assessment: Wound #1 Left,Dorsal Foot Performed By: Clinician Baruch Gouty, RN Compression Type: Three Layer Post Procedure Diagnosis Same as Pre-procedure Electronic Signature(s) Signed: 04/22/2022 5:24:26 PM By: Baruch Gouty RN, BSN Entered By: Baruch Gouty on 04/22/2022 14:21:40 -------------------------------------------------------------------------------- Encounter Discharge Information Details Patient Name: Date of Service: [name] 04/22/2022 2:00 PM Medical Record Number: [id number] Patient Account Number: [id number] Date of Birth/Sex: Treating RN: [birth year] (71 y.o. [name] Primary Care Juliannah Ohmann: PA [name], [place] Other Clinician: Referring Frankee Gritz: Treating Melodye Swor/Extender: [name] in Treatment: 10 Encounter Discharge Information Items Post Procedure Vitals Discharge Condition: Stable Temperature (F): 98.3 Ambulatory Status: Wheelchair Pulse (bpm): 73 Discharge Destination: Home Respiratory Rate (breaths/min): 18 Transportation: Private Auto Blood Pressure (mmHg): 123/73 Accompanied By:  self Schedule Follow-up Appointment: Yes Clinical Summary of Care: Patient Declined Electronic Signature(s) Signed: 04/22/2022 5:24:26 PM By: Baruch Gouty RN, BSN Entered By: Baruch Gouty on 04/22/2022 14:55:33 -------------------------------------------------------------------------------- Lower Extremity Assessment Details Patient Name: Date of Service: [name] 04/22/2022 2:00 PM Medical Record Number: [id number] Patient Account Number: [id number] Date of Birth/Sex: Treating RN: [birth year] (71 y.o. [name] Primary Care Eldrick Penick: PA [name], [place] Other Clinician: Referring Breahna Boylen: Treating Benn Tarver/Extender: Valeria Batman Weeks in Treatment: 10 Edema Assessment Assessed: [Left: No] [Right: No] Edema: [Left: Yes] [Right: Yes] Calf Left: Right: Point of  Measurement: From Medial Instep 41.4 cm 43.5 cm Ankle Left: Right: Point of Measurement: From Medial Instep 24.5 cm 26.5 cm Vascular Assessment Pulses: Dorsalis Pedis Palpable: [Left:Yes] [Right:Yes] Electronic Signature(s) Signed: 04/22/2022 5:24:26 PM By: Baruch Gouty RN, BSN Entered By: Baruch Gouty on 04/22/2022 14:02:48 -------------------------------------------------------------------------------- Multi Wound Chart Details Patient Name: Date of Service: [name] 04/22/2022 2:00 PM Medical Record Number: [id number] Patient Account Number: [id number] Date of Birth/Sex: Treating RN: 1951/03/10 (71 y.o. M) Primary Care Khoi Hamberger: PA [name], [place] Other Clinician: Referring Bartow Zylstra: Treating Caelen Higinbotham/Extender: [name], [name] in Treatment: 10 Vital Signs Height(in): Pulse(bpm): 30 Weight(lbs): Blood Pressure(mmHg): 123/73 Body Mass Index(BMI): Temperature(F): 98.3 Respiratory Rate(breaths/min): 18 Photos: [N/A:N/A] Left, Dorsal Foot Right, Medial Lower Leg N/A Wound Location: Gradually Appeared Gradually Appeared N/A Wounding Event: Lymphedema  Lymphedema N/A Primary Etiology: Cataracts, Anemia, Lymphedema, Cataracts, Anemia, Lymphedema, N/A Comorbid History: Congestive Heart Failure, Deep Vein Congestive Heart Failure, Deep Vein Thrombosis, Hypertension, Peripheral Thrombosis, Hypertension, Peripheral Venous Disease Venous Disease 12/07/2021 12/07/2021 N/A Date Acquired: 10 10 N/A Weeks of Treatment: Open Open N/A Wound Status: No No N/A Wound Recurrence: 1.8x2x0.2 2.9x3.3x0.9 N/A Measurements L x W x D (cm) 2.827 7.516 N/A A (cm) : rea 0.565 6.765 N/A Volume (cm) : 25.50% -40.70% N/A % Reduction in Area: 62.80% -216.70% N/A % Reduction in Volume: Full Thickness Without Exposed Full Thickness With Exposed Support N/A Classification: Support Structures Structures Medium Large N/A Exudate Amount: Serosanguineous Serosanguineous N/A Exudate Type: red, brown red, brown N/A Exudate Color: Distinct, outline attached Distinct, outline attached N/A Wound Margin: Small (1-33%) Large (67-100%) N/A Granulation Amount: Red Red N/A Granulation Quality: Large (67-100%) Small (1-33%) N/A Necrotic Amount: Fat Layer (Subcutaneous Tissue): Yes Fat Layer (Subcutaneous Tissue): Yes N/A Exposed Structures: Fascia: No Fascia: No Tendon: No Tendon: No Muscle: No Muscle: No Joint: No Joint: No Bone: No Bone: No Small (1-33%) Small (1-33%) N/A Epithelialization: Debridement - Selective/Open Wound Debridement - Excisional N/A Debridement: Pre-procedure Verification/Time Out 14:25 14:25 N/A Taken: Lidocaine 4% Topical Solution Lidocaine 4% Topical Solution N/A Pain Control: Slough Other, Subcutaneous, Slough N/A Tissue Debrided: Non-Viable Tissue Skin/Subcutaneous Tissue N/A Level: 3.6 9.57 N/A Debridement A (sq cm): rea Curette Curette N/A Instrument: Minimum Minimum N/A Bleeding: Pressure Pressure N/A Hemostasis A chieved: 3 3 N/A Procedural Pain: 1 1 N/A Post Procedural Pain: Procedure was tolerated  well Procedure was tolerated well N/A Debridement Treatment Response: 1.8x2x0.2 2.9x3.3x0.9 N/A Post Debridement Measurements L x W x D (cm) 0.565 6.765 N/A Post Debridement Volume: (cm) Compression Therapy Compression Therapy N/A Procedures Performed: Debridement Debridement Treatment Notes Electronic Signature(s) Signed: 04/22/2022 2:38:10 PM By: Fredirick Maudlin MD FACS Entered By: Fredirick Maudlin on 04/22/2022 14:38:10 -------------------------------------------------------------------------------- Multi-Disciplinary Care Plan Details Patient Name: Date of Service: [name] 04/22/2022 2:00 PM Medical Record Number: [id number] Patient Account Number: [id number] Date of Birth/Sex: Treating RN: Oct 26, 1950 (71 y.o. [name] Primary Care Michaeljames Milnes: PA [name], [place] Other Clinician: Referring Saphira Lahmann: Treating Brightyn Mozer/Extender: [name] in Treatment: 10 Multidisciplinary Care Plan reviewed with physician Active Inactive Venous Leg Ulcer Nursing Diagnoses: Knowledge deficit related to disease process and management Potential for venous Insuffiency (use before diagnosis confirmed) Goals: Patient will maintain optimal edema control Date Initiated: 02/17/2022 Target Resolution Date: 05/05/2022 Goal Status: Active Interventions: Assess peripheral edema status every visit. Compression as ordered Provide education on venous insufficiency Treatment Activities: Non-invasive vascular studies : 02/17/2022 Therapeutic compression applied : 02/17/2022 Notes: Wound/Skin Impairment Nursing Diagnoses: Impaired tissue integrity Knowledge deficit related  to ulceration/compromised skin integrity Goals: Patient/caregiver will verbalize understanding of skin care regimen Date Initiated: 02/17/2022 Target Resolution Date: 05/05/2022 Goal Status: Active Ulcer/skin breakdown will have a volume reduction of 30% by week 4 Date Initiated: 02/17/2022 Date  Inactivated: 03/10/2022 Target Resolution Date: 03/10/2022 Unmet Reason: infection being treated, Goal Status: Unmet waiting on derm appte Ulcer/skin breakdown will have a volume reduction of 50% by week 8 Date Initiated: 03/10/2022 Date Inactivated: 04/14/2022 Target Resolution Date: 04/07/2022 Goal Status: Unmet Unmet Reason: calcinosis Ulcer/skin breakdown will have a volume reduction of 80% by week 12 Date Initiated: 04/14/2022 Target Resolution Date: 05/05/2022 Goal Status: Active Interventions: Assess patient/caregiver ability to obtain necessary supplies Assess patient/caregiver ability to perform ulcer/skin care regimen upon admission and as needed Assess ulceration(s) every visit Provide education on ulcer and skin care Treatment Activities: Skin care regimen initiated : 02/17/2022 Topical wound management initiated : 02/17/2022 Notes: Electronic Signature(s) Signed: 04/22/2022 5:24:26 PM By: Baruch Gouty RN, BSN Entered By: Baruch Gouty on 04/22/2022 14:21:02 -------------------------------------------------------------------------------- Pain Assessment Details Patient Name: Date of Service: [name] 04/22/2022 2:00 PM Medical Record Number: [id number] Patient Account Number: [id number] Date of Birth/Sex: Treating RN: 02/11/1951 (71 y.o. [name] Primary Care Sayed Apostol: PA [name], [place] Other Clinician: Referring Jammy Stlouis: Treating Shakthi Scipio/Extender: [name] in Treatment: 10 Active Problems Location of Pain Severity and Description of Pain Patient Has Paino No Site Locations Rate the pain. Rate the pain. Current Pain Level: 0 Pain Management and Medication Current Pain Management: Electronic Signature(s) Signed: 04/22/2022 5:24:26 PM By: Baruch Gouty RN, BSN Entered By: Baruch Gouty on 04/22/2022 13:55:19 -------------------------------------------------------------------------------- Patient/Caregiver Education  Details Patient Name: Date of Service: Spackman, [name] 9/28/2023andnbsp2:00 PM Medical Record Number: [id number] Patient Account Number: [id number] Date of Birth/Gender: Treating RN: 1950-10-11 (71 y.o. [name] Primary Care Physician: PA [name], [place] Other Clinician: Referring Physician: Treating Physician/Extender: [name] in Treatment: 10 Education Assessment Education Provided To: Patient Education Topics Provided Venous: Methods: Explain/Verbal Responses: Reinforcements needed, State content correctly Electronic Signature(s) Signed: 04/22/2022 5:24:26 PM By: Baruch Gouty RN, BSN Entered By: Baruch Gouty on 04/22/2022 14:21:19 -------------------------------------------------------------------------------- Wound Assessment Details Patient Name: Date of Service: [name] 04/22/2022 2:00 PM Medical Record Number: [id number] Patient Account Number: [id number] Date of Birth/Sex: Treating RN: 1951/06/06 (71 y.o. [name] Primary Care Mylin Hirano: PA [name], [place] Other Clinician: Referring Michaelann Gunnoe: Treating Genee Rann/Extender: [name] in Treatment: 10 Wound Status Wound Number: 1 Primary Lymphedema Etiology: Wound Location: Left, Dorsal Foot Wound Open Wounding Event: Gradually Appeared Status: Date Acquired: 12/07/2021 Comorbid Cataracts, Anemia, Lymphedema, Congestive Heart Failure, Deep Weeks Of Treatment: 10 History: Vein Thrombosis, Hypertension, Peripheral Venous Disease Clustered Wound: No Photos Wound Measurements Length: (cm) 1.8 Width: (cm) 2 Depth: (cm) 0.2 Area: (cm) 2.827 Volume: (cm) 0.565 % Reduction in Area: 25.5% % Reduction in Volume: 62.8% Epithelialization: Small (1-33%) Tunneling: No Undermining: No Wound Description Classification: Full Thickness Without Exposed Support Structures Wound Margin: Distinct, outline attached Exudate Amount: Medium Exudate Type:  Serosanguineous Exudate Color: red, brown Foul Odor After Cleansing: No Slough/Fibrino Yes Wound Bed Granulation Amount: Small (1-33%) Exposed Structure Granulation Quality: Red Fascia Exposed: No Necrotic Amount: Large (67-100%) Fat Layer (Subcutaneous Tissue) Exposed: Yes Necrotic Quality: Adherent Slough Tendon Exposed: No Muscle Exposed: No Joint Exposed: No Bone Exposed: No Treatment Notes Wound #1 (Foot) Wound Laterality: Dorsal, Left Cleanser Peri-Wound Care Triamcinolone 15 (g) Discharge Instruction: Use triamcinolone 15 (g) as directed Zinc Oxide Ointment  30g tube Discharge Instruction: Apply Zinc Oxide to periwound with each dressing change Sween Lotion (Moisturizing lotion) Discharge Instruction: Apply moisturizing lotion as directed Topical Sodium Thiosulfate Primary Dressing KerraCel Ag Gelling Fiber Dressing, 4x5 in (silver alginate) Discharge Instruction: Apply silver alginate to wound bed as instructed Secondary Dressing Woven Gauze Sponge, Non-Sterile 4x4 in Discharge Instruction: Apply over primary dressing as directed. Zetuvit Plus 4x8 in Discharge Instruction: Apply over primary dressing as directed. Secured With Compression Wrap ThreePress (3 layer compression wrap) Discharge Instruction: Apply three layer compression as directed. Compression Stockings Add-Ons Electronic Signature(s) Signed: 04/22/2022 5:24:26 PM By: Baruch Gouty RN, BSN Entered By: Baruch Gouty on 04/22/2022 14:13:16 -------------------------------------------------------------------------------- Wound Assessment Details Patient Name: Date of Service: [name] 04/22/2022 2:00 PM Medical Record Number: [id number] Patient Account Number: [id number] Date of Birth/Sex: Treating RN: 10/11/1950 (70 y.o. [name] Primary Care Blakelee Allington: PA [name], [place] Other Clinician: Referring Tanazia Achee: Treating Nary Sneed/Extender: Valeria Batman Weeks in Treatment:  10 Wound Status Wound Number: 3 Primary Lymphedema Etiology: Wound Location: Right, Medial Lower Leg Wound Open Wounding Event: Gradually Appeared Status: Date Acquired: 12/07/2021 Comorbid Cataracts, Anemia, Lymphedema, Congestive Heart Failure, Deep Weeks Of Treatment: 10 History: Vein Thrombosis, Hypertension, Peripheral Venous Disease Clustered Wound: No Photos Wound Measurements Length: (cm) 2.9 Width: (cm) 3.3 Depth: (cm) 0.9 Area: (cm) 7.516 Volume: (cm) 6.765 % Reduction in Area: -40.7% % Reduction in Volume: -216.7% Epithelialization: Small (1-33%) Tunneling: No Undermining: No Wound Description Classification: Full Thickness With Exposed Support Structures Wound Margin: Distinct, outline attached Exudate Amount: Large Exudate Type: Serosanguineous Exudate Color: red, brown Foul Odor After Cleansing: No Slough/Fibrino Yes Wound Bed Granulation Amount: Large (67-100%) Exposed Structure Granulation Quality: Red Fascia Exposed: No Necrotic Amount: Small (1-33%) Fat Layer (Subcutaneous Tissue) Exposed: Yes Necrotic Quality: Adherent Slough Tendon Exposed: No Muscle Exposed: No Joint Exposed: No Bone Exposed: No Treatment Notes Wound #3 (Lower Leg) Wound Laterality: Right, Medial Cleanser Peri-Wound Care Ketoconazole Cream 2% Discharge Instruction: Apply Ketoconazole to periwoiund rash Triamcinolone 15 (g) Discharge Instruction: Use triamcinolone 15 (g) as directed Sween Lotion (Moisturizing lotion) Discharge Instruction: Apply moisturizing lotion as directed Topical Primary Dressing KerraCel Ag Gelling Fiber Dressing, 4x5 in (silver alginate) Discharge Instruction: Apply silver alginate to wound bed as instructed Secondary Dressing ABD Pad, 8x10 Discharge Instruction: Apply over primary dressing as directed. Woven Gauze Sponge, Non-Sterile 4x4 in Discharge Instruction: Apply over primary dressing as directed. Secured With Compression  Wrap ThreePress (3 layer compression wrap) Discharge Instruction: Apply three layer compression as directed. Compression Stockings Add-Ons Electronic Signature(s) Signed: 04/22/2022 5:24:26 PM By: Baruch Gouty RN, BSN Entered By: Baruch Gouty on 04/22/2022 14:13:33 -------------------------------------------------------------------------------- Vitals Details Patient Name: Date of Service: [name] 04/22/2022 2:00 PM Medical Record Number: [id number] Patient Account Number: [id number] Date of Birth/Sex: Treating RN: Mar 30, 1951 (71 y.o. [name] Primary Care Leyana Whidden: PA [name], [place] Other Clinician: Referring Abrie Egloff: Treating Demarian Epps/Extender: [name] in Treatment: 10 Vital Signs Time Taken: 13:54 Temperature (F): 98.3 Pulse (bpm): 73 Respiratory Rate (breaths/min): 18 Blood Pressure (mmHg): 123/73 Reference Range: 80 - 120 mg / dl Electronic Signature(s) Signed: 04/22/2022 5:24:26 PM By: Baruch Gouty RN, BSN Entered By: Baruch Gouty on 04/22/2022 13:55:01

## 2022-04-24 ENCOUNTER — Telehealth: Payer: Medicare Other | Admitting: Nurse Practitioner

## 2022-04-24 DIAGNOSIS — U071 COVID-19: Secondary | ICD-10-CM

## 2022-04-24 MED ORDER — MOLNUPIRAVIR EUA 200MG CAPSULE: 4.0000 | ORAL_CAPSULE | Freq: Two times a day (BID) | ORAL | 0 refills | Status: AC

## 2022-04-24 NOTE — Patient Instructions (Signed)
[name], thank you for joining Gildardo Pounds, NP for today's virtual visit.  While this provider is not your primary care provider (PCP), if your PCP is located in our provider database this encounter information will be shared with them immediately following your visit.  Consent: (Patient) [name] provided verbal consent for this virtual visit at the beginning of the encounter.  Current Medications:  Current Outpatient Medications:    molnupiravir EUA (LAGEVRIO) 200 mg CAPS capsule, Take 4 capsules (800 mg total) by mouth 2 (two) times daily for 5 days., Disp: 40 capsule, Rfl: 0   acetaminophen (TYLENOL) 500 MG tablet, Take by mouth as needed for moderate pain or mild pain., Disp: , Rfl:    amoxicillin-clavulanate (AUGMENTIN) 875-125 MG tablet, Take 1 tablet by mouth 2 (two) times daily., Disp: , Rfl:    apixaban (ELIQUIS) 2.5 MG TABS tablet, Take 1 tablet (2.5 mg total) by mouth 2 (two) times daily., Disp: 60 tablet, Rfl: 5   atenolol (TENORMIN) 25 MG tablet, Take 1 tablet (25 mg total) by mouth 2 (two) times daily., Disp: 180 tablet, Rfl: 3   b complex vitamins tablet, Take 1 tablet by mouth daily., Disp: , Rfl:    betamethasone dipropionate (DIPROLENE) 0.05 % cream, Apply 1 application topically every other day., Disp: , Rfl:    cholecalciferol (VITAMIN D) 1000 units tablet, Take 1,000 Units by mouth daily., Disp: , Rfl:    Coenzyme Q10 (CO Q 10) 100 MG CAPS, Take 100 mg by mouth daily. , Disp: , Rfl:    diphenhydrAMINE (BENADRYL) 25 MG tablet, Take 50 mg by mouth at bedtime., Disp: , Rfl:    ezetimibe (ZETIA) 10 MG tablet, Take 1 tablet (10 mg total) by mouth daily., Disp: 90 tablet, Rfl: 3   folic acid (FOLVITE) 465 MCG tablet, Take 800 mcg by mouth daily. , Disp: , Rfl:    furosemide (LASIX) 40 MG tablet, TAKE 1 TABLET BY MOUTH FOR 3 DAYS THEN TAKE ONLY AS NEEDED FOR EDEMA, Disp: 90 tablet, Rfl: 3   glucosamine-chondroitin 500-400 MG tablet, Take 2 tablets by mouth daily. ,  Disp: , Rfl:    ibuprofen (ADVIL,MOTRIN) 800 MG tablet, Take 800 mg by mouth daily. , Disp: , Rfl:    Krill Oil 500 MG CAPS, Take 500 mg by mouth daily. , Disp: , Rfl:    lisinopril (ZESTRIL) 10 MG tablet, Take 1 tablet (10 mg total) by mouth daily., Disp: 90 tablet, Rfl: 3   Melatonin 10 MG TABS, Take 20 mg by mouth at bedtime., Disp: , Rfl:    potassium chloride (K-DUR) 10 MEQ tablet, TAKE 1 TABLET BY MOUTH FOR 3 DAYS THEN TAKE ONLY AS NEEDED WHEN YOU TAKE A LASIX, Disp: 90 tablet, Rfl: 3   rosuvastatin (CRESTOR) 5 MG tablet, Take 1 tablet (5 mg total) by mouth daily., Disp: 90 tablet, Rfl: 0   Saw Palmetto, Serenoa repens, (SAW PALMETTO PO), Take 300 mg by mouth daily., Disp: , Rfl:    sulfamethoxazole-trimethoprim (BACTRIM DS) 800-160 MG tablet, Take 1 tablet by mouth 2 (two) times daily., Disp: , Rfl:    triamcinolone cream (KENALOG) 0.1 %, Apply 1 application topically as directed., Disp: 30 g, Rfl: 3   Medications ordered in this encounter:  Meds ordered this encounter  Medications   molnupiravir EUA (LAGEVRIO) 200 mg CAPS capsule    Sig: Take 4 capsules (800 mg total) by mouth 2 (two) times daily for 5 days.    Dispense:  40 capsule    Refill:  0    Order Specific Question:   Supervising Provider    Answer:   Sabra Heck, BRIAN [[id number]]     *If you need refills on other medications prior to your next appointment, please contact your pharmacy*  Follow-Up: Call back or seek an in-person evaluation if the symptoms worsen or if the condition fails to improve as anticipated.  [facility] [phone]  Other Instructions  Please keep well-hydrated and get plenty of rest. Start a saline nasal rinse to flush out your nasal passages. You can use plain Mucinex to help thin congestion. If you have a humidifier, you can use this daily as needed.    You are to wear a mask for 5 days from onset of your symptoms.  After day 5, if you have had no fever and you are feeling better  with NO symptoms, you can end masking. Keep in mind you can be contagious 10 days from the onset of symptoms  After day 5 if you have a fever or are having significant symptoms, please wear your mask for full 10 days.   If you note any worsening of symptoms, any significant shortness of breath or any chest pain, please seek ER evaluation ASAP.  Please do not delay care!    If you note any worsening of symptoms, any significant shortness of breath or any chest pain, please seek ER evaluation ASAP.  Please do not delay care!    If you have been instructed to have an in-person evaluation today at a local Urgent Care facility, please use the link below. It will take you to a list of all of our available [facility], including address, phone number and hours of operation. Please do not delay care.  [facility]  If you or a family member do not have a primary care provider, use the link below to schedule a visit and establish care. When you choose a [facility] primary care physician or advanced practice provider, you gain a long-term partner in health. Find a Primary Care Provider  Learn more about [facility]'s in-office and virtual care options: [facility]

## 2022-04-24 NOTE — Progress Notes (Signed)
Virtual Visit Consent   Avant Printy, you are scheduled for a virtual visit with a [facility] provider today. Just as with appointments in the office, your consent must be obtained to participate. Your consent will be active for this visit and any virtual visit you may have with one of our providers in the next 365 days. If you have a MyChart account, a copy of this consent can be sent to you electronically.  As this is a virtual visit, video technology does not allow for your provider to perform a traditional examination. This may limit your provider's ability to fully assess your condition. If your provider identifies any concerns that need to be evaluated in person or the need to arrange testing (such as labs, EKG, etc.), we will make arrangements to do so. Although advances in technology are sophisticated, we cannot ensure that it will always work on either your end or our end. If the connection with a video visit is poor, the visit may have to be switched to a telephone visit. With either a video or telephone visit, we are not always able to ensure that we have a secure connection.  By engaging in this virtual visit, you consent to the provision of healthcare and authorize for your insurance to be billed (if applicable) for the services provided during this visit. Depending on your insurance coverage, you may receive a charge related to this service.  I need to obtain your verbal consent now. Are you willing to proceed with your visit today? [name] has provided verbal consent on 04/24/2022 for a virtual visit (video or telephone). Gildardo Pounds, NP  Date: 04/24/2022 2:19 PM  Virtual Visit via Video Note   I, Gildardo Pounds, connected with  [name]  ([id number], 1950-12-20) on 04/24/22 at  2:30 PM EDT by a video-enabled telemedicine application and verified that I am speaking with the correct person using two identifiers.  Location: Patient: Virtual Visit Location Patient:  Home Provider: Virtual Visit Location Provider: Home Office   I discussed the limitations of evaluation and management by telemedicine and the availability of in person appointments. The patient expressed understanding and agreed to proceed.    History of Present Illness: [name] is a 71 y.o. who identifies as a male who was assigned adult at birth, and is being seen today for positive home covid test.  Tested positive for COVID today. Notes symptoms onset 2 days ago with sore throat, cough, nasal congestion and rhinorrhea.   Problems:  Patient Active Problem List   Diagnosis Date Noted   History of DVT (deep vein thrombosis) 03/04/2022   Unilateral primary osteoarthritis, right hip 02/26/2020   Chronic pain syndrome 09/13/2019   Macrocytic anemia 06/12/2019   Screening for viral disease 06/12/2019   S/P cervical spinal fusion 05/22/2019   Cervical facet joint syndrome 05/22/2019   Chronic radicular lumbar pain 05/22/2019   Lumbar degenerative disc disease 05/22/2019   Lumbar spondylosis 05/22/2019   Lumbar facet arthropathy 05/22/2019   Insomnia 05/16/2019   Bilateral lower extremity edema 04/04/2019   BMI 37.0-37.9, adult 07/20/2018   Encounter for Medicare annual wellness exam 04/27/2018   Colon cancer screening 04/27/2018   Flu vaccine need 04/27/2018   Healthcare maintenance 02/15/2018   Stented coronary artery    Myositis    Hyperlipidemia    Coronary artery disease    Spinal stenosis    Weakness    HTN (hypertension)     Allergies: No Known Allergies Medications:  Current Outpatient Medications:    molnupiravir EUA (LAGEVRIO) 200 mg CAPS capsule, Take 4 capsules (800 mg total) by mouth 2 (two) times daily for 5 days., Disp: 40 capsule, Rfl: 0   acetaminophen (TYLENOL) 500 MG tablet, Take by mouth as needed for moderate pain or mild pain., Disp: , Rfl:    amoxicillin-clavulanate (AUGMENTIN) 875-125 MG tablet, Take 1 tablet by mouth 2 (two) times daily., Disp: ,  Rfl:    apixaban (ELIQUIS) 2.5 MG TABS tablet, Take 1 tablet (2.5 mg total) by mouth 2 (two) times daily., Disp: 60 tablet, Rfl: 5   atenolol (TENORMIN) 25 MG tablet, Take 1 tablet (25 mg total) by mouth 2 (two) times daily., Disp: 180 tablet, Rfl: 3   b complex vitamins tablet, Take 1 tablet by mouth daily., Disp: , Rfl:    betamethasone dipropionate (DIPROLENE) 0.05 % cream, Apply 1 application topically every other day., Disp: , Rfl:    cholecalciferol (VITAMIN D) 1000 units tablet, Take 1,000 Units by mouth daily., Disp: , Rfl:    Coenzyme Q10 (CO Q 10) 100 MG CAPS, Take 100 mg by mouth daily. , Disp: , Rfl:    diphenhydrAMINE (BENADRYL) 25 MG tablet, Take 50 mg by mouth at bedtime., Disp: , Rfl:    ezetimibe (ZETIA) 10 MG tablet, Take 1 tablet (10 mg total) by mouth daily., Disp: 90 tablet, Rfl: 3   folic acid (FOLVITE) 578 MCG tablet, Take 800 mcg by mouth daily. , Disp: , Rfl:    furosemide (LASIX) 40 MG tablet, TAKE 1 TABLET BY MOUTH FOR 3 DAYS THEN TAKE ONLY AS NEEDED FOR EDEMA, Disp: 90 tablet, Rfl: 3   glucosamine-chondroitin 500-400 MG tablet, Take 2 tablets by mouth daily. , Disp: , Rfl:    ibuprofen (ADVIL,MOTRIN) 800 MG tablet, Take 800 mg by mouth daily. , Disp: , Rfl:    Krill Oil 500 MG CAPS, Take 500 mg by mouth daily. , Disp: , Rfl:    lisinopril (ZESTRIL) 10 MG tablet, Take 1 tablet (10 mg total) by mouth daily., Disp: 90 tablet, Rfl: 3   Melatonin 10 MG TABS, Take 20 mg by mouth at bedtime., Disp: , Rfl:    potassium chloride (K-DUR) 10 MEQ tablet, TAKE 1 TABLET BY MOUTH FOR 3 DAYS THEN TAKE ONLY AS NEEDED WHEN YOU TAKE A LASIX, Disp: 90 tablet, Rfl: 3   rosuvastatin (CRESTOR) 5 MG tablet, Take 1 tablet (5 mg total) by mouth daily., Disp: 90 tablet, Rfl: 0   Saw Palmetto, Serenoa repens, (SAW PALMETTO PO), Take 300 mg by mouth daily., Disp: , Rfl:    sulfamethoxazole-trimethoprim (BACTRIM DS) 800-160 MG tablet, Take 1 tablet by mouth 2 (two) times daily., Disp: , Rfl:     triamcinolone cream (KENALOG) 0.1 %, Apply 1 application topically as directed., Disp: 30 g, Rfl: 3  Observations/Objective: Patient is well-developed, well-nourished in no acute distress.  Resting comfortably  at home.  Head is normocephalic, atraumatic.  No labored breathing.  Speech is clear and coherent with logical content.  Patient is alert and oriented at baseline.    Assessment and Plan: 1. Positive self-administered antigen test for COVID-19 - molnupiravir EUA (LAGEVRIO) 200 mg CAPS capsule; Take 4 capsules (800 mg total) by mouth 2 (two) times daily for 5 days.  Dispense: 40 capsule; Refill: 0   Please keep well-hydrated and get plenty of rest. Start a saline nasal rinse to flush out your nasal passages. You can use plain Mucinex to help thin congestion.  If you have a humidifier, you can use this daily as needed.    You are to wear a mask for 5 days from onset of your symptoms.  After day 5, if you have had no fever and you are feeling better with NO symptoms, you can end masking. Keep in mind you can be contagious 10 days from the onset of symptoms  After day 5 if you have a fever or are having significant symptoms, please wear your mask for full 10 days.   If you note any worsening of symptoms, any significant shortness of breath or any chest pain, please seek ER evaluation ASAP.  Please do not delay care!    If you note any worsening of symptoms, any significant shortness of breath or any chest pain, please seek ER evaluation ASAP.  Please do not delay care!   Follow Up Instructions: I discussed the assessment and treatment plan with the patient. The patient was provided an opportunity to ask questions and all were answered. The patient agreed with the plan and demonstrated an understanding of the instructions.  A copy of instructions were sent to the patient via MyChart unless otherwise noted below.    The patient was advised to call back or seek an in-person evaluation  if the symptoms worsen or if the condition fails to improve as anticipated.  Time:  I spent 11 minutes with the patient via telehealth technology discussing the above problems/concerns.    Gildardo Pounds, NP

## 2022-04-28 ENCOUNTER — Ambulatory Visit: Payer: Medicare Other

## 2022-04-28 ENCOUNTER — Ambulatory Visit (HOSPITAL_BASED_OUTPATIENT_CLINIC_OR_DEPARTMENT_OTHER): Payer: Medicare Other | Admitting: General Surgery

## 2022-05-03 LAB — GLUCOSE, CAPILLARY: Glucose-Capillary: 248 mg/dL — ABNORMAL HIGH (ref 70–99)

## 2022-05-05 ENCOUNTER — Encounter (HOSPITAL_BASED_OUTPATIENT_CLINIC_OR_DEPARTMENT_OTHER): Payer: Medicare Other | Attending: General Surgery | Admitting: General Surgery

## 2022-05-05 ENCOUNTER — Ambulatory Visit (HOSPITAL_COMMUNITY)
Admission: RE | Admit: 2022-05-05 | Discharge: 2022-05-05 | Disposition: A | Discharge status: Home / Self Care | Payer: Medicare Other | Source: Ambulatory Visit | Attending: Vascular Surgery | Admitting: Vascular Surgery

## 2022-05-05 ENCOUNTER — Ambulatory Visit (INDEPENDENT_AMBULATORY_CARE_PROVIDER_SITE_OTHER): Payer: Medicare Other | Admitting: Physician Assistant

## 2022-05-05 VITALS — BP 120/67 | HR 57 | Temp 97.3°F | Resp 18 | Ht 71.0 in | Wt 277.1 lb

## 2022-05-05 DIAGNOSIS — Z86718 Personal history of other venous thrombosis and embolism: Secondary | ICD-10-CM | POA: Insufficient documentation

## 2022-05-05 DIAGNOSIS — I251 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: Secondary | ICD-10-CM

## 2022-05-05 DIAGNOSIS — I509 Heart failure, unspecified: Secondary | ICD-10-CM | POA: Diagnosis not present

## 2022-05-05 DIAGNOSIS — L942 Calcinosis cutis: Secondary | ICD-10-CM | POA: Diagnosis not present

## 2022-05-05 DIAGNOSIS — R600 Localized edema: Secondary | ICD-10-CM

## 2022-05-05 DIAGNOSIS — L97522 Non-pressure chronic ulcer of other part of left foot with fat layer exposed: Secondary | ICD-10-CM | POA: Insufficient documentation

## 2022-05-05 DIAGNOSIS — I110 Hypertensive heart disease with heart failure: Secondary | ICD-10-CM | POA: Insufficient documentation

## 2022-05-05 DIAGNOSIS — I872 Venous insufficiency (chronic) (peripheral): Secondary | ICD-10-CM | POA: Insufficient documentation

## 2022-05-05 DIAGNOSIS — L97809 Non-pressure chronic ulcer of other part of unspecified lower leg with unspecified severity: Secondary | ICD-10-CM | POA: Diagnosis not present

## 2022-05-05 DIAGNOSIS — I890 Lymphedema, not elsewhere classified: Secondary | ICD-10-CM | POA: Diagnosis not present

## 2022-05-05 DIAGNOSIS — L97812 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d: Secondary | ICD-10-CM | POA: Diagnosis not present

## 2022-05-05 NOTE — Progress Notes (Signed)
PAXON, PROPES ([id number]) [id number] Page 1 of 12 Visit Report for 05/05/2022 Chief Complaint Document Details Patient Name: Date of Service: [name], [name] 05/05/2022 12:30 PM Medical Record Number: [id number] Patient Account Number: [id number] Date of Birth/Sex: Treating RN: February 27, 1951 (71 y.o. M) Primary Care Provider: PA [name], NO Other Clinician: Referring Provider: Treating Provider/Extender: [name] in Treatment: 12 Information Obtained from: Patient Chief Complaint Patient seen for complaints of Non-Healing Wounds. Electronic Signature(s) Signed: 05/05/2022 1:29:40 PM By: Fredirick Maudlin MD FACS Entered By: Fredirick Maudlin on 05/05/2022 13:29:40 -------------------------------------------------------------------------------- Debridement Details Patient Name: Date of Service: [name], [name] 05/05/2022 12:30 PM Medical Record Number: [id number] Patient Account Number: [id number] Date of Birth/Sex: Treating RN: [birth year] (71 y.o. [name] Primary Care Provider: PA [name], [place] Other Clinician: Referring Provider: Treating Provider/Extender: [name] in Treatment: 12 Debridement Performed for Assessment: Wound #3 Right,Medial Lower Leg Performed By: Physician Fredirick Maudlin, MD Debridement Type: Debridement Level of Consciousness (Pre-procedure): Awake and Alert Pre-procedure Verification/Time Out Yes - 12:55 Taken: Start Time: 12:58 Pain Control: Lidocaine 4% T opical Solution T Area Debrided (L x W): otal 3.2 (cm) x 4.8 (cm) = 15.36 (cm) Tissue and other material debrided: Viable, Non-Viable, Slough, Subcutaneous, Slough, Other: calcium deposits Level: Skin/Subcutaneous Tissue Debridement Description: Excisional Instrument: Curette Bleeding: Minimum Hemostasis Achieved: Pressure Procedural Pain: 0 Post Procedural Pain: 0 Response to Treatment: Procedure was tolerated  well Level of Consciousness (Post- Awake and Alert procedure): Post Debridement Measurements of Total Wound Length: (cm) 3.2 Width: (cm) 4.8 Depth: (cm) 0.8 Volume: (cm) 9.651 Character of Wound/Ulcer Post Debridement: Improved Post Procedure Diagnosis Same as HOSEA, [name] ([id number]) [id number] Page 2 of 12 Notes : scribed by Baruch Gouty, RN for Dr. [name] Electronic Signature(s) Signed: 05/05/2022 1:55:57 PM By: Fredirick Maudlin MD FACS Signed: 05/05/2022 5:37:12 PM By: Baruch Gouty RN, BSN Entered By: Baruch Gouty on 05/05/2022 13:01:21 -------------------------------------------------------------------------------- Debridement Details Patient Name: Date of Service: DECLIN, [name] 05/05/2022 12:30 PM Medical Record Number: [id number] Patient Account Number: [id number] Date of Birth/Sex: Treating RN: Feb 01, 1951 (71 y.o. [name] Primary Care Provider: PA [name], [place] Other Clinician: Referring Provider: Treating Provider/Extender: [name] in Treatment: 12 Debridement Performed for Assessment: Wound #1 Left,Dorsal Foot Performed By: Physician Fredirick Maudlin, MD Debridement Type: Debridement Level of Consciousness (Pre-procedure): Awake and Alert Pre-procedure Verification/Time Out Yes - 12:55 Taken: Start Time: 12:58 Pain Control: Lidocaine 4% T opical Solution T Area Debrided (L x W): otal 1.7 (cm) x 1.7 (cm) = 2.89 (cm) Tissue and other material debrided: Viable, Non-Viable, Slough, Subcutaneous, Slough Level: Skin/Subcutaneous Tissue Debridement Description: Excisional Instrument: Curette Bleeding: Minimum Hemostasis Achieved: Pressure Procedural Pain: 0 Post Procedural Pain: 0 Response to Treatment: Procedure was tolerated well Level of Consciousness (Post- Awake and Alert procedure): Post Debridement Measurements of Total Wound Length: (cm) 1.7 Width: (cm) 1.7 Depth:  (cm) 0.2 Volume: (cm) 0.454 Character of Wound/Ulcer Post Debridement: Improved Post Procedure Diagnosis Same as Pre-procedure Notes : scribed by Baruch Gouty, RN for Dr. [name] Electronic Signature(s) Signed: 05/05/2022 1:55:57 PM By: Fredirick Maudlin MD FACS Signed: 05/05/2022 5:37:12 PM By: Baruch Gouty RN, BSN Entered By: Baruch Gouty on 05/05/2022 13:01:35 HPI Details -------------------------------------------------------------------------------- [name] ([id number]) [id number] Page 3 of 12 Patient Name: Date of Service: [name] 05/05/2022 12:30 PM Medical Record Number: [id number] Patient Account Number: [id number] Date of Birth/Sex: Treating RN: [birth year] (71 y.o. M) Primary Care Provider: PA [name], NO Other Clinician: Referring Provider:  Treating Provider/Extender: Valeria Batman Weeks in Treatment: 12 History of Present Illness HPI Description: ADMISSION 02/09/2022 This is a 71 year old man who recently moved to [place] from [place]. He has a history of of coronary artery disease and prior left popliteal vein DVT . He is not diabetic and does not smoke. 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 He has had multiple spinal surgeries and has limited mobility. He primarily uses a motorized wheelchair. He has had lymphedema for many years and does have lymphedema pumps although he says he does not use them frequently. He has wounds on his bilateral lower extremities. He was receiving care in [place] for these prior to his move. They were apparently packing his wounds with Betadine soaked gauze. He has worn compression wraps in the past but not recently. He did say that they helped tremendously. In clinic today, his right ABI is noncompressible but has good signals; the left ABI was 1.17. No formal vascular studies have been performed. On his left dorsal foot, there is a circular lesion that exposes  the fat layer. There is fairly heavy slough accumulation within the wound, but no significant odor. On his right medial calf, he has multiple pinholes that have slough buildup in them and probe for about 2 to 4 mm in depth. They are draining clear fluid. On his right lateral midfoot, he has ulceration consistent with venous stasis. It is geographic and has slough accumulation. 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 02/17/2022: All of the wounds are little bit larger today and have more slough accumulation. Today, the medial calf openings are larger and probe to something that feels calcified. There is odor coming from his wounds. His vascular studies are scheduled for August 9 and 10. 02-24-2022 upon evaluation today patient presents for follow-up concerning his ongoing issues here with his lower extremities. With that being said he does have significant problems with what appears to be cellulitis unfortunately the PCR culture that was sent last week got crushed by UPS in transit. With that being said we are going to reobtain a culture today although I am actually to do it on the right medial leg as this seems to be the most inflamed area today where there is some questionable drainage as well. I am going to remove some of the calcium deposits which are loosening obtain a deeper culture from this area. Fortunately I do not see any evidence of active infection systemically which is good news but locally I think we are having a bigger issue here. He does have his appointment next Thursday with vascular. Patient has also been referred to [facility] for further evaluation and treatment of the calcinosis for possible sulfate injections. 03/04/2022: The culture that was performed last week grew out multiple species including Klebsiella, Morganella, and Pseudomonas aeruginosa. He had been prescribed Bactrim but this  was not adequate to cover all of the species and levofloxacin was recommended. He completed the Bactrim but did not pick up the levofloxacin and begin taking it until yesterday. 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 He had arterial studies performed today. He does have evidence of peripheral arterial disease. Results copied here: +-------+-----------+-----------+------------+------------+ ABI/TBIT oday's ABIT oday's TBIPrevious ABIPrevious TBI +-------+-----------+-----------+------------+------------+ Right  0.75    +-------+-----------+-----------+------------+------------+  Left [place] 0.57    +-------+-----------+-----------+------------+------------+ Arterial wall calcification precludes accurate ankle pressures and ABIs. Summary: Right: Resting right ankle-brachial index indicates noncompressible right lower extremity arteries. The right toe-brachial index is normal. Left: Resting left ankle-brachial index indicates noncompressible left lower extremity arteries. The left toe-brachial index is abnormal. He continues to have further tissue breakdown at the right medial calf and there is ample slough accumulation along with necrotic subcutaneous tissue. The left dorsal foot wound has built up slough, as well. The right medial foot wound is nearly closed with just a light layer of eschar over the surface. The right dorsal lateral foot wound has also accumulated a fair amount of slough and remains quite tender. 03/10/2022: He has been taking the prescribed levofloxacin and has about 3 days left of this. The erythema and induration on his right leg has improved. He continues to experience further breakdown of the right medial calf wound. There is extensive slough and debris accumulation there. There is heavy  slough on his left dorsal foot wound, but no odor or purulent drainage. The right medial foot wound appears to be closed. The right dorsal lateral foot wound also has a fair amount of slough but it is less tender than at our last visit. He still does not have an appointment with dermatology. 03/22/2022: The right anterior tibial wound has closed. The right lateral foot wound is nearly closed with just a little bit of slough. The left dorsal foot wound is smaller and continues to have some slough buildup but less so than at prior visits. There is good granulation tissue underlying the slough. Unfortunately the right medial calf wound continues to deteriorate. It has a foul odor today and a lot of necrotic tissue, the surrounding skin is also erythematous and moist. 03/30/2022: The right lateral foot wound continues to contract and just has a little bit of slough and eschar present. The left dorsal foot wound is also smaller with slough overlying good granulation tissue. The right medial calf wound actually looks quite a bit better today; there is no odor and there is much less necrotic tissue although some remains present. We have been using topical gentamicin and he has been taking oral Augmentin. 04/07/2022: The patient saw dermatology at [facility] last week. Unfortunately, it appears that they did not read any of the documentation that was provided. 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 Overall, it was a highly unsatisfactory consultation. In the meantime, however, we were able to identify compounding pharmacy who could create a topical sodium thiosulfate ointment. The patient has that with him today. Both of the right foot wounds  are now closed. The left dorsal foot wound has contracted but still has a layer of slough on the surface. The medial calf wounds on the right all look a little bit better. They still have heavy slough along with the chunks of calcium. Everything is stained purple. 04/14/2022: The wound on his dorsal left foot is a little bit smaller again today. There is still slough accumulation on the surface. The medial calf wounds have quite a bit less slough accumulation today. His right leg, however, is warm and erythematous, concerning for cellulitis. KILLIAN, SCHWER ([id number]) [id number] Page 4 of 12 04/14/2022: He completed his  course of Augmentin yesterday. The medial calf wound sites are much cleaner today and shallower. There is less fragmented calcium in the wounds. He has a lot of lymphedema fluid-like drainage from these wounds, but no purulent discharge or malodor. The wound on his dorsal left foot has also contracted and is shallower. There is a layer of slough over healthy granulation tissue. 05/05/2022: The left dorsal foot wound is smaller today with less slough and more granulation tissue. The right medial calf wounds are shallower, but have extensive slough and some fat necrosis. The drainage has diminished considerably, however. 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 Electronic Signature(s) Signed: 05/05/2022 1:31:06 PM By: Fredirick Maudlin MD FACS Entered By: Fredirick Maudlin on 05/05/2022 13:31:06 -------------------------------------------------------------------------------- Physical Exam Details Patient Name: Date of Service: NIC, LAMPE [name] 05/05/2022 12:30 PM Medical Record Number: [id number] Patient Account Number: [id number] Date of Birth/Sex: Treating RN: 11-May-1951 (71 y.o. M) Primary Care Provider: PA [name], NO Other  Clinician: Referring Provider: Treating Provider/Extender: [name], [name] in Treatment: 12 Constitutional . . . . No acute distress.Marland Kitchen Respiratory Normal work of breathing on room air.. Notes 05/05/2022: The left dorsal foot wound is smaller today with less slough and more granulation tissue. The right medial calf wounds are shallower, but have extensive slough and some fat necrosis. The drainage has diminished considerably, however. Electronic Signature(s) Signed: 05/05/2022 1:32:04 PM By: Fredirick Maudlin MD FACS Entered By: Fredirick Maudlin on 05/05/2022 13:32:03 -------------------------------------------------------------------------------- Physician Orders Details Patient Name: Date of Service: SHERLOCK, NANCARROW [name] 05/05/2022 12:30 PM Medical Record Number: [id number] Patient Account Number: [id number] Date of Birth/Sex: Treating RN: 1950/11/07 (71 y.o. 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 - Dr. [name] Rm 1 Return A Anesthetic Wound #1 Left,Dorsal Foot (In clinic) Topical Lidocaine 4% applied to wound bed Wound #3 Right,Medial Lower Leg (In clinic)  Topical Lidocaine 4% applied to wound bed Bathing/ Shower/ Hygiene May shower with protection but do not get wound dressing(s) wet. Edema Control - Lymphedema / SCD / Other Lymphedema Pumps. Use Lymphedema pumps on leg(s) 2-3 times a day for 45-60 minutes. If wearing any wraps or hose, do not remove them. Continue exercising as instructed. - use 1-2 times per day over wraps Elevate legs to the level of the heart or above for 30 minutes daily and/or when sitting, a frequency of: Exercise regularly Wound Treatment Wound #1 - Foot Wound Laterality: Dorsal, Left Peri-Wound Care: Sween Lotion (Moisturizing lotion) 1 x Per Week Discharge Instructions: Apply moisturizing lotion as directed Prim Dressing: KerraCel Ag Gelling Fiber Dressing, 4x5 in (silver alginate) 1 x Per Week ary Discharge Instructions: Apply silver alginate to wound bed as instructed Secondary Dressing: Woven Gauze Sponge, Non-Sterile 4x4 in 1 x Per Week Discharge Instructions: Apply over  primary dressing as directed. Compression Wrap: ThreePress (3 layer compression wrap) 1 x Per Week Discharge Instructions: Apply three layer compression as directed. Wound #3 - Lower Leg Wound Laterality: Right, Medial Peri-Wound Care: Triamcinolone 15 (g) 1 x Per Week Discharge Instructions: Use triamcinolone 15 (g) as directed Peri-Wound Care: Zinc Oxide Ointment 30g tube 1 x Per Week Discharge Instructions: Apply Zinc Oxide to periwound with each dressing change Peri-Wound Care: Sween Lotion (Moisturizing lotion) 1 x Per Week Discharge Instructions: Apply moisturizing lotion as directed Topical: sodium thiosulfate ointment 1 x Per Week Discharge Instructions: apply to wound bed Secondary Dressing: ABD Pad, 8x10 1 x Per Week Discharge Instructions: Apply over primary dressing as directed. Secondary Dressing: Woven Gauze Sponge, Non-Sterile 4x4 in 1 x Per Week Discharge Instructions: Apply over primary dressing as directed. Compression  Wrap: ThreePress (3 layer compression wrap) 1 x Per Week Discharge Instructions: Apply three layer compression as directed. Electronic Signature(s) Signed: 05/05/2022 1:55:57 PM By: Fredirick Maudlin MD FACS Entered By: Fredirick Maudlin on 05/05/2022 13:32:13 -------------------------------------------------------------------------------- Problem List Details Patient Name: Date of Service: ANUAR, WALGREN [name] 05/05/2022 12:30 PM Medical Record Number: [id number] Patient Account Number: [id number] Date of Birth/Sex: Treating RN: 20-Jun-1951 (71 y.o. [name] Primary Care Provider: PA Darnelle Spangle Other Clinician: ZERIC, BARANOWSKI ([id number]) [id number] Page 6 of 12 Referring Provider: Treating Provider/Extender: [name] in Treatment: 12 Active Problems ICD-10 Encounter Code Description Active Date MDM Diagnosis L97.812 Non-pressure chronic ulcer of other part of right lower leg with fat layer 02/09/2022 No Yes exposed L97.522 Non-pressure chronic ulcer of other part of left foot with fat layer exposed 02/09/2022 No Yes 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 02/09/2022 No Yes 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) 02/09/2022 No Yes I10 Essential (primary) hypertension 02/09/2022 No Yes I25.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 02/09/2022 No Yes I89.0 Lymphedema, not elsewhere classified 02/09/2022 No Yes Z86.718 Personal history of other venous thrombosis and embolism 02/09/2022 No Yes Inactive Problems ICD-10 Code Description Active Date Inactive Date L97.512 Non-pressure chronic ulcer of other part of right foot with fat layer exposed 02/17/2022 02/17/2022 Resolved Problems Electronic Signature(s) Signed: 05/05/2022 1:29:04 PM By: Fredirick Maudlin MD FACS Entered By: Fredirick Maudlin on 05/05/2022 13:29:03 -------------------------------------------------------------------------------- Progress  Note Details Patient Name: Date of Service: [name] 05/05/2022 12:30 PM Medical Record Number: [id number] Patient Account Number: [id number] Date of Birth/Sex: Treating RN: 1950-08-28 (71 y.o. M) Primary Care Provider: PA [name], NO Other Clinician: Referring Provider: Treating Provider/Extender: [name] in Treatment: [address] [place], [name] ([id number]) [id number] Page 7 of 12 Chief Complaint Information obtained from Patient Patient seen for complaints of Non-Healing Wounds. History of Present Illness (HPI) ADMISSION 02/09/2022 This is a 71 year old man who recently moved to [place] from [place]. He has a history of of coronary artery disease and prior left popliteal vein DVT . He is not diabetic and does not smoke. 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 He has had multiple spinal surgeries and has limited mobility. He primarily uses a motorized wheelchair. He has had lymphedema for many years and does have lymphedema pumps although he says he does not use them frequently. He has wounds on his bilateral lower extremities. He was receiving care in [place] for these prior to his move. They were apparently packing his wounds with Betadine soaked gauze. He has worn compression wraps in the past but not recently. He did say that they helped tremendously. In  clinic today, his right ABI is noncompressible but has good signals; the left ABI was 1.17. No formal vascular studies have been performed. On his left dorsal foot, there is a circular lesion that exposes the fat layer. There is fairly heavy slough accumulation within the wound, but no significant odor. On his right medial calf, he has multiple pinholes that have slough buildup in them and probe for about 2 to 4 mm in depth. They are draining clear fluid. On his right lateral midfoot, he has ulceration consistent with venous stasis. It is geographic and  has slough accumulation. 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 02/17/2022: All of the wounds are little bit larger today and have more slough accumulation. Today, the medial calf openings are larger and probe to something that feels calcified. There is odor coming from his wounds. His vascular studies are scheduled for August 9 and 10. 02-24-2022 upon evaluation today patient presents for follow-up concerning his ongoing issues here with his lower extremities. With that being said he does have significant problems with what appears to be cellulitis unfortunately the PCR culture that was sent last week got crushed by [employer] in transit. With that being said we are going to reobtain a culture today although I am actually to do it on the right medial leg as this seems to be the most inflamed area today where there is some questionable drainage as well. I am going to remove some of the calcium deposits which are loosening obtain a deeper culture from this area. Fortunately I do not see any evidence of active infection systemically which is good news but locally I think we are having a bigger issue here. He does have his appointment next Thursday with vascular. Patient has also been referred to [facility] for further evaluation and treatment of the calcinosis for possible sulfate injections. 03/04/2022: The culture that was performed last week grew out multiple species including Klebsiella, Morganella, and Pseudomonas aeruginosa. He had been prescribed Bactrim but this was not adequate to cover all of the species and levofloxacin was recommended. He completed the Bactrim but did not pick up the levofloxacin and begin taking it until yesterday. 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 He had arterial studies performed today. He does have evidence of peripheral arterial disease. Results copied here: +-------+-----------+-----------+------------+------------+ ABI/TBIT oday's ABIT oday's TBIPrevious ABIPrevious TBI +-------+-----------+-----------+------------+------------+ Right [place] 0.75    +-------+-----------+-----------+------------+------------+ Left [place] 0.57    +-------+-----------+-----------+------------+------------+ Arterial wall calcification precludes accurate ankle pressures and ABIs. Summary: Right: Resting right ankle-brachial index indicates noncompressible right lower extremity arteries. The right toe-brachial index is normal. Left: Resting left ankle-brachial index indicates noncompressible left lower extremity arteries. The left toe-brachial index is abnormal. He continues to have further tissue breakdown at the right medial calf and there is ample slough accumulation along with necrotic subcutaneous tissue. The left dorsal foot wound has built up slough, as well. The right medial foot wound is nearly closed with just a light layer of eschar over the surface. The right dorsal lateral foot wound has also accumulated a fair amount of slough and remains quite tender. 03/10/2022: He has been taking the prescribed levofloxacin and has about 3 days left of this. The erythema and induration on his right leg has improved. He continues  to experience further breakdown of the right medial calf wound. There is extensive slough and debris accumulation there. There is heavy slough on his left dorsal foot wound, but no odor or purulent drainage. The right medial foot wound appears to be closed. The right dorsal lateral foot wound also has a fair amount of slough but it is less tender than at our last visit. He still does not have an appointment with dermatology. 03/22/2022: The right anterior tibial wound has closed. The right  lateral foot wound is nearly closed with just a little bit of slough. The left dorsal foot wound is smaller and continues to have some slough buildup but less so than at prior visits. There is good granulation tissue underlying the slough. Unfortunately the right medial calf wound continues to deteriorate. It has a foul odor today and a lot of necrotic tissue, the surrounding skin is also erythematous and moist. 03/30/2022: The right lateral foot wound continues to contract and just has a little bit of slough and eschar present. The left dorsal foot wound is also smaller with slough overlying good granulation tissue. The right medial calf wound actually looks quite a bit better today; there is no odor and there is much less necrotic tissue although some remains present. We have been using topical gentamicin and he has been taking oral Augmentin. 04/07/2022: The patient saw dermatology at [facility] last week. Unfortunately, it appears that they did not read any of the documentation that was provided. 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 Overall, it was a highly unsatisfactory consultation. In the meantime, however, we were able to identify compounding pharmacy who could create a topical sodium thiosulfate ointment. The patient has that with him today. Both of the right foot wounds are now closed. The left dorsal foot wound has contracted but still has a layer of slough on the surface. The medial calf wounds on the right all look a little bit better. They still have heavy slough along with the chunks of calcium. Everything is stained purple. 04/14/2022: The wound on his dorsal left foot is a little bit smaller again today. There is  still slough accumulation on the surface. The medial calf wounds have quite a bit less slough accumulation today. His right leg, however, is warm and erythematous, concerning for cellulitis. 04/14/2022: He completed his course of Augmentin yesterday. The medial calf wound sites are much cleaner today and shallower. There is less fragmented calcium in the wounds. He has a lot of lymphedema fluid-like drainage from these wounds, but no purulent discharge or malodor. The wound on his dorsal left foot has also contracted and is shallower. There is a layer of slough over healthy granulation tissue. 05/05/2022: The left dorsal foot wound is smaller today with less slough and more granulation tissue. The right medial calf wounds are shallower, but have TREVIN, GARTRELL ([id number]) [id number] Page 8 of 12 extensive slough and some fat necrosis. The drainage has diminished considerably, however. 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 Patient History Information obtained from Patient, Chart. Family History Cancer - Mother, Heart Disease - Father,Mother, Hypertension - Father,Siblings, Lung Disease -  Siblings, No family history of Diabetes, Hereditary Spherocytosis, Kidney Disease, Seizures, Stroke, Thyroid Problems, Tuberculosis. Social History Former smoker - quit 1991, Marital Status - Married, Alcohol Use - Moderate, Drug Use - No History, Caffeine Use - Daily - coffee. Medical History Eyes Patient has history of Cataracts - right eye removed Denies history of Glaucoma, Optic Neuritis Ear/Nose/Mouth/Throat Denies history of Chronic sinus problems/congestion, Middle ear problems Hematologic/Lymphatic Patient has history of Anemia - macrocytic, Lymphedema Cardiovascular Patient has history of Congestive Heart Failure, Deep Vein Thrombosis - left leg,  Hypertension, Peripheral Venous Disease Endocrine Denies history of Type I Diabetes, Type II Diabetes Genitourinary Denies history of End Stage Renal Disease Integumentary (Skin) Denies history of History of Burn Oncologic Denies history of Received Chemotherapy, Received Radiation Psychiatric Denies history of Anorexia/bulimia, Confinement Anxiety Hospitalization/Surgery History - cervical fusion. - lumbar surgery. - coronary stent placement. - lasik eye surgery. - hydrocele excision. Medical A Surgical History Notes nd Constitutional Symptoms (General Health) obesity Ear/Nose/Mouth/Throat hard of hearing Respiratory frozen diaphragm right Cardiovascular hyperlipidemia Musculoskeletal myositis, lumbar DDD, spinal stenosis, cervical facet joint syndrome Objective Constitutional No acute distress.. Vitals Time Taken: 12:40 PM, Temperature: 97.7 F, Pulse: 64 bpm, Respiratory Rate: 20 breaths/min, Blood Pressure: 125/60 mmHg. Respiratory Normal work of breathing on room air.. General Notes: 05/05/2022: The left dorsal foot wound is smaller today with less slough and more granulation tissue. The right medial calf wounds are shallower, but have extensive slough and some fat necrosis. The drainage has diminished considerably, however. Integumentary (Hair, Skin) Wound #1 status is Open. Original cause of wound was Gradually Appeared. The date acquired was: 12/07/2021. The wound has been in treatment 12 weeks. The wound is located on the Left,Dorsal Foot. The wound measures 1.7cm length x 1.7cm width x 0.2cm depth; 2.27cm^2 area and 0.454cm^3 volume. There is Fat Layer (Subcutaneous Tissue) exposed. There is no tunneling or undermining noted. There is a medium amount of serosanguineous drainage noted. The wound margin is distinct with the outline attached to the wound base. There is small (1-33%) red granulation within the wound bed. There is a large (67-100%) amount of necrotic tissue  within the wound bed including Adherent Slough. The periwound skin appearance had no abnormalities noted for texture. The periwound skin appearance had no abnormalities noted for moisture. The periwound skin appearance exhibited: Erythema. The surrounding wound skin color is noted with erythema. Periwound temperature was noted as No Abnormality. The periwound has tenderness on palpation. Wound #3 status is Open. Original cause of wound was Gradually Appeared. The date acquired was: 12/07/2021. The wound has been in treatment 12 weeks. The wound is located on the Right,Medial Lower Leg. The wound measures 3.2cm length x 4.8cm width x 0.8cm depth; 12.064cm^2 area and 9.651cm^3 volume. There is Fat Layer (Subcutaneous Tissue) exposed. There is no tunneling or undermining noted. There is a medium amount of serosanguineous drainage noted. The wound margin is distinct with the outline attached to the wound base. There is small (1-33%) red granulation within the wound bed. There is a large (67-100%) amount of necrotic tissue within the wound bed including Adherent Slough. The periwound skin appearance had no abnormalities noted for moisture. The periwound skin appearance had no abnormalities noted for color. The periwound skin appearance exhibited: Excoriation. Periwound temperature was noted as No Abnormality. The periwound has tenderness on palpation. 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 There was a Excisional Skin/Subcutaneous Tissue Debridement with a total area of 2.89 sq cm performed by Fredirick Maudlin, MD. With the following instrument(s): Curette to remove Viable and Non-Viable tissue/material. Material removed includes Subcutaneous Tissue and Slough and after achieving pain control using Lidocaine 4% T opical Solution. No specimens were taken. A time out was conducted at 12:55, prior to the start of the procedure. A Minimum amount of bleeding was controlled with Pressure. The procedure was tolerated well with a pain level of 0 throughout and a pain level of 0 following the procedure. Post Debridement Measurements: 1.7cm length x 1.7cm width x 0.2cm depth; 0.454cm^3 volume. Character of Wound/Ulcer Post Debridement is improved. Post procedure Diagnosis Wound #1: Same as Pre-Procedure General Notes: : scribed by Baruch Gouty, RN for Dr. [name]. Pre-procedure diagnosis of Wound #1 is a Lymphedema located on the Left,Dorsal Foot . There was a Three Layer Compression Therapy Procedure by Baruch Gouty, RN. Post procedure Diagnosis Wound #1: Same as Pre-Procedure Wound #3 Pre-procedure diagnosis of Wound #3 is a Lymphedema located on the Right,Medial Lower Leg . There was a Excisional Skin/Subcutaneous Tissue Debridement with a total area of 15.36 sq cm performed by Fredirick Maudlin, MD. With the following instrument(s): Curette to remove Viable and Non-Viable tissue/material. Material removed includes Subcutaneous Tissue, Slough, and Other: calcium deposits after achieving pain control using Lidocaine 4% Topical Solution. No specimens were taken. A time out was conducted at 12:55, prior to the start of the procedure. A Minimum amount of bleeding was controlled with Pressure. The procedure was tolerated well with a pain level of 0 throughout and a pain level of 0 following the procedure.  Post Debridement Measurements: 3.2cm length x 4.8cm width x 0.8cm depth; 9.651cm^3 volume. Character of Wound/Ulcer Post Debridement is improved. Post procedure Diagnosis Wound #3: Same as Pre-Procedure General Notes: : scribed by Baruch Gouty, RN for Dr. [name]. Pre-procedure diagnosis of Wound #3 is a Lymphedema located on the Right,Medial Lower Leg . There was a Three Layer Compression Therapy Procedure by Baruch Gouty, RN. Post procedure Diagnosis Wound #3: Same as Pre-Procedure Plan Follow-up Appointments: Return Appointment in 1 week. - Dr. [name] Rm 1 Anesthetic: Wound #1 Left,Dorsal Foot: (In clinic) Topical Lidocaine 4% applied to wound bed Wound #3 Right,Medial Lower Leg: (In clinic) Topical Lidocaine 4% applied to wound bed Bathing/ Shower/ Hygiene: May shower with protection but do not get wound dressing(s) wet. Edema Control - Lymphedema / SCD / Other: Lymphedema Pumps. Use Lymphedema pumps on leg(s) 2-3 times a day for 45-60 minutes. If wearing any wraps or hose, do not remove them. Continue exercising as instructed. - use 1-2 times per day over wraps Elevate legs to the level of the heart or above for 30 minutes daily and/or when sitting, a frequency of: Exercise regularly WOUND #1: - Foot Wound Laterality: Dorsal, Left Peri-Wound Care: Sween Lotion (Moisturizing lotion) 1 x Per Week/ Discharge Instructions: Apply moisturizing lotion as directed Prim Dressing: KerraCel Ag Gelling Fiber Dressing, 4x5 in (silver alginate) 1 x Per Week/ ary Discharge Instructions: Apply silver alginate to wound bed as instructed Secondary Dressing: Woven Gauze Sponge, Non-Sterile 4x4  in 1 x Per Week/ Discharge Instructions: Apply over primary dressing as directed. Com pression Wrap: ThreePress (3 layer compression wrap) 1 x Per Week/ Discharge Instructions: Apply three layer compression as directed. WOUND #3: - Lower Leg Wound Laterality: Right, Medial Peri-Wound Care: Triamcinolone  15 (g) 1 x Per Week/ Discharge Instructions: Use triamcinolone 15 (g) as directed Peri-Wound Care: Zinc Oxide Ointment 30g tube 1 x Per Domingo Dimes, [name] ([id number]) [id number] Page 10 of 12 Discharge Instructions: Apply Zinc Oxide to periwound with each dressing change Peri-Wound Care: Sween Lotion (Moisturizing lotion) 1 x Per Week/ Discharge Instructions: Apply moisturizing lotion as directed Topical: sodium thiosulfate ointment 1 x Per Week/ Discharge Instructions: apply to wound bed Secondary Dressing: ABD Pad, 8x10 1 x Per Week/ Discharge Instructions: Apply over primary dressing as directed. Secondary Dressing: Woven Gauze Sponge, Non-Sterile 4x4 in 1 x Per Week/ Discharge Instructions: Apply over primary dressing as directed. Com pression Wrap: ThreePress (3 layer compression wrap) 1 x Per Week/ Discharge Instructions: Apply three layer compression as directed. 05/05/2022: The left dorsal foot wound is smaller today with less slough and more granulation tissue. The right medial calf wounds are shallower, but have extensive slough and some fat necrosis. The drainage has diminished considerably, however. I used a curette to debride slough and nonviable subcutaneous tissue from the dorsal foot wound and slough and nonviable subcutaneous tissue/fat necrosis from the right medial calf wound. We will continue to use sodium thiosulfate topically on the calcinosis cutis wounds (right medial calf) with silver alginate on the dorsal foot. Continue bilateral 3 layer compression. Follow-up in 1 week. Electronic Signature(s) Signed: 05/05/2022 1:33:54 PM By: Fredirick Maudlin MD FACS Previous Signature: 05/05/2022 1:33:17 PM Version By: Fredirick Maudlin MD FACS Entered By: Fredirick Maudlin on 05/05/2022 13:33:54 -------------------------------------------------------------------------------- HxROS Details Patient Name: Date of Service: [name], [name] [name] 05/05/2022 12:30  PM Medical Record Number: [id number] Patient Account Number: [id number] Date of Birth/Sex: Treating RN: [birth year] (71 y.o. M) Primary Care Provider: PA [name], NO Other Clinician: Referring Provider: Treating Provider/Extender: [name] in Treatment: 12 Information Obtained From Patient Chart Constitutional Symptoms (General Health) Medical History: Past Medical History Notes: obesity Eyes Medical History: Positive for: Cataracts - right eye removed Negative for: Glaucoma; Optic Neuritis Ear/Nose/Mouth/Throat Medical History: Negative for: Chronic sinus problems/congestion; Middle ear problems Past Medical History Notes: hard of hearing Hematologic/Lymphatic Medical History: Positive for: Anemia - macrocytic; Lymphedema Respiratory Medical History: Past Medical History Notes: frozen diaphragm right Cardiovascular [name], [name] ([id number]) [id number] Page 11 of 12 Medical History: Positive for: Congestive Heart Failure; Deep Vein Thrombosis - left leg; Hypertension; Peripheral Venous Disease Past Medical History Notes: hyperlipidemia Endocrine Medical History: Negative for: Type I Diabetes; Type II Diabetes Genitourinary Medical History: Negative for: End Stage Renal Disease Integumentary (Skin) Medical History: Negative for: History of Burn Musculoskeletal Medical History: Past Medical History Notes: myositis, lumbar DDD, spinal stenosis, cervical facet joint syndrome Oncologic Medical History: Negative for: Received Chemotherapy; Received Radiation Psychiatric Medical History: Negative for: Anorexia/bulimia; Confinement Anxiety HBO Extended History Items Eyes: Cataracts Immunizations Pneumococcal Vaccine: Received Pneumococcal Vaccination: Yes Received Pneumococcal Vaccination On or After 60th Birthday: Yes Implantable Devices No devices added Hospitalization / Surgery History Type of  Hospitalization/Surgery cervical fusion lumbar surgery coronary stent placement lasik eye surgery hydrocele excision Family and Social History Cancer: Yes - Mother; Diabetes: No; Heart Disease: Yes - Father,Mother; Hereditary Spherocytosis: No; Hypertension: Yes - Father,Siblings; Kidney Disease: No; Lung Disease: Yes - Siblings; Seizures: No; Stroke: No; Thyroid  Problems: No; Tuberculosis: No; Former smoker - quit 1991; Marital Status - Married; Alcohol Use: Moderate; Drug Use: No History; Caffeine Use: Daily - coffee; Financial Concerns: No; Food, Clothing or Shelter Needs: No; Support System Lacking: No; Transportation Concerns: No Electronic Signature(s) Signed: 05/05/2022 1:55:57 PM By: Fredirick Maudlin MD FACS Entered By: Fredirick Maudlin on 05/05/2022 13:31:13 [name] ([id number]) [id number] Page 12 of 12 -------------------------------------------------------------------------------- SuperBill Details Patient Name: Date of Service: DARUS, HERSHMAN 05/05/2022 Medical Record Number: [id number] Patient Account Number: [id number] Date of Birth/Sex: Treating RN: 1951/05/30 (71 y.o. M) Primary Care Provider: PA [name], NO Other Clinician: Referring Provider: Treating Provider/Extender: [name] in Treatment: 12 Diagnosis Coding ICD-10 Codes Code Description [phone] 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d L97.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) I10 Essential (primary) hypertension I25.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 I89.0 Lymphedema, not elsewhere classified Z86.718 Personal history of other venous thrombosis and embolism Facility Procedures : CPT4 Code: [id number] Description: 20254 - DEB SUBQ TISSUE 20 SQ CM/<  ICD-10 Diagnosis Description L97.812 Non-pressure chronic ulcer of other part of right lower leg with fat layer exp L97.522 Non-pressure chronic ulcer of other part of left foot with fat layer exposed Modifier: osed Quantity: 1 Physician Procedures : CPT4 Code Description Modifier [id number] - WC PHYS LEVEL 4 - EST PT 25 ICD-10 Diagnosis Description 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d L97.522 Non-pressure chronic ulcer of other part of left  foot with fat layer exposed 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) Quantity: 1 : [id number] 11042 - WC PHYS SUBQ TISS 20 SQ CM ICD-10 Diagnosis Description 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d L97.522 Non-pressure chronic ulcer of other part of left foot with fat layer exposed Quantity: 1 Electronic Signature(s) Signed: 05/05/2022 1:34:22 PM By: Fredirick Maudlin MD FACS Entered By: Fredirick Maudlin on 05/05/2022 13:34:22

## 2022-05-05 NOTE — Progress Notes (Signed)
Office Note     CC:  follow up Requesting Provider:  Elgie Collard, PA-C  HPI: [name] is a 71 y.o. (1950-10-31) adult who presents for evaluation of bilateral lower extremity edema referred by his cardiologist.  Patient states he has had bilateral lower extremity venous ulcerations since the beginning of this year.  He has also had on and off ulcerations requiring Unna boots.  Patient is admittedly minimally mobile and attributes this to myositis.  He is seen today in a wheelchair however states he is able to walk in his house with a walker.  He has history of left lower extremity DVT in the past.  He follows regularly with wound care for Unna boot changes.  He has an appointment today to rewrap his legs.  Current ulcerations have been in Unna boots for the past 2 months.  He also has lymphedema pumps at home however states he does not find any relief or benefit from use.  He also has had arterial imaging studies in August of this year demonstrating adequate circulation for wound healing.   Past Medical History:  Diagnosis Date   Adenomatous colon polyp - diminutive 06/2019   no recall needed   Coronary artery disease    a. s/p BMS to LAD (1998) b. s/p DES to RCA (06/24/14)   Dyspnea    HTN (hypertension)    Hyperlipidemia    Myositis    chronic CK elevation ( 415 CK, ALL MM CK subtype, seen by Rheumatology and considered normal Variant)   Palatal fistula    Palpable abd. aorta    Pneumonia    RBBB    Spinal stenosis    Dr Vaughan Sine  surgery   Weakness     Past Surgical History:  Procedure Laterality Date   CERVICAL FUSION  09/2009   CORONARY ANGIOPLASTY     CORONARY STENT PLACEMENT  06/24/2014   DES to RCA   [place] WITH CORONARY ANGIOGRAM N/A 06/24/2014   Procedure: LEFT HEART CATHETERIZATION WITH CORONARY ANGIOGRAM;  Surgeon: Troy Sine, MD;  Location: [facility] CATH LAB;  Service: Cardiovascular;   Laterality: N/A;   LUMBAR SPINE SURGERY  5/13   MUSCLE BIOPSY Left 07/11/2018   Procedure: Left Vastus lateralus muscle biopsy;  Surgeon: Judith Part, MD;  Location: [facility];  Service: Neurosurgery;  Laterality: Left;  Left Vastus lateralus muscle biopsy   PERCUTANEOUS CORONARY STENT INTERVENTION (PCI-S)  06/24/2014   Procedure: PERCUTANEOUS CORONARY STENT INTERVENTION (PCI-S);  Surgeon: Troy Sine, MD;  Location: [facility] LAB;  Service: Cardiovascular;;  Distal RCA    Social History   Socioeconomic History   Marital status: Married    Spouse name: Not on file   Number of children: Not on file   Years of education: Not on file   Highest education level: Not on file  Occupational History   Not on file  Tobacco Use   Smoking status: Former    Packs/day: 0.75    Years: 23.00    Total pack years: 17.25    Types: Cigarettes    Quit date: 07/26/1989    Years since quitting: 32.7   Smokeless tobacco: Never  Vaping Use   Vaping Use: Never used  Substance and Sexual Activity   Alcohol use: Yes    Comment: 3-6 cans per week   Drug use: No   Sexual activity: Yes  Birth control/protection: None  Other Topics Concern   Not on file  Social History Narrative   Married, wife moving to [place]   Retired , prior occupation self   Patient is a former smoker   Alcohol use - yes   Regular exercise   Drug Use- no    caffeine 3 cups coffee daily   Social Determinants of Radio broadcast assistant Strain: Not on file  Food Insecurity: Not on file  Transportation Needs: Not on file  Physical Activity: Not on file  Stress: Not on file  Social Connections: Not on file  Intimate Partner Violence: Not on file    Family History  Problem Relation Age of Onset   Heart attack Mother    Cancer Mother        esophageal   Aneurysm Father    Heart disease Father    Cystic fibrosis Brother        no prior health issues, rare form of cystic fibrosis of the lungs (aug 2016)   Stroke  Neg Hx     Current Outpatient Medications  Medication Sig Dispense Refill   acetaminophen (TYLENOL) 500 MG tablet Take by mouth as needed for moderate pain or mild pain.     apixaban (ELIQUIS) 2.5 MG TABS tablet Take 1 tablet (2.5 mg total) by mouth 2 (two) times daily. 60 tablet 5   atenolol (TENORMIN) 25 MG tablet Take 1 tablet (25 mg total) by mouth 2 (two) times daily. 180 tablet 3   b complex vitamins tablet Take 1 tablet by mouth daily.     betamethasone dipropionate (DIPROLENE) 0.05 % cream Apply 1 application topically every other day.     cholecalciferol (VITAMIN D) 1000 units tablet Take 1,000 Units by mouth daily.     Coenzyme Q10 (CO Q 10) 100 MG CAPS Take 100 mg by mouth daily.      diphenhydrAMINE (BENADRYL) 25 MG tablet Take 50 mg by mouth at bedtime.     ezetimibe (ZETIA) 10 MG tablet Take 1 tablet (10 mg total) by mouth daily. 90 tablet 3   folic acid (FOLVITE) 841 MCG tablet Take 800 mcg by mouth daily.      furosemide (LASIX) 40 MG tablet TAKE 1 TABLET BY MOUTH FOR 3 DAYS THEN TAKE ONLY AS NEEDED FOR EDEMA 90 tablet 3   glucosamine-chondroitin 500-400 MG tablet Take 2 tablets by mouth daily.      ibuprofen (ADVIL,MOTRIN) 800 MG tablet Take 800 mg by mouth daily.      Krill Oil 500 MG CAPS Take 500 mg by mouth daily.      lisinopril (ZESTRIL) 10 MG tablet Take 1 tablet (10 mg total) by mouth daily. 90 tablet 3   Melatonin 10 MG TABS Take 20 mg by mouth at bedtime.     potassium chloride (K-DUR) 10 MEQ tablet TAKE 1 TABLET BY MOUTH FOR 3 DAYS THEN TAKE ONLY AS NEEDED WHEN YOU TAKE A LASIX 90 tablet 3   rosuvastatin (CRESTOR) 5 MG tablet Take 1 tablet (5 mg total) by mouth daily. 90 tablet 0   Saw Palmetto, Serenoa repens, (SAW PALMETTO PO) Take 300 mg by mouth daily.     triamcinolone cream (KENALOG) 0.1 % Apply 1 application topically as directed. 30 g 3   amoxicillin-clavulanate (AUGMENTIN) 875-125 MG tablet Take 1 tablet by mouth 2 (two) times daily. (Patient not taking:  Reported on 05/05/2022)     sulfamethoxazole-trimethoprim (BACTRIM DS) 800-160 MG tablet Take 1 tablet  by mouth 2 (two) times daily. (Patient not taking: Reported on 05/05/2022)     No current facility-administered medications for this visit.    No Known Allergies   REVIEW OF SYSTEMS:   '[X]'$  denotes positive finding, '[ ]'$  denotes negative finding Cardiac  Comments:  Chest pain or chest pressure:    Shortness of breath upon exertion:    Short of breath when lying flat:    Irregular heart rhythm:        Vascular    Pain in calf, thigh, or hip brought on by ambulation:    Pain in feet at night that wakes you up from your sleep:     Blood clot in your veins:    Leg swelling:         Pulmonary    Oxygen at home:    Productive cough:     Wheezing:         Neurologic    Sudden weakness in arms or legs:     Sudden numbness in arms or legs:     Sudden onset of difficulty speaking or slurred speech:    Temporary loss of vision in one eye:     Problems with dizziness:         Gastrointestinal    Blood in stool:     Vomited blood:         Genitourinary    Burning when urinating:     Blood in urine:        Psychiatric    Major depression:         Hematologic    Bleeding problems:    Problems with blood clotting too easily:        Skin    Rashes or ulcers:        Constitutional    Fever or chills:      PHYSICAL EXAMINATION:  Vitals:   05/05/22 1006  BP: 120/67  Pulse: (!) 57  Resp: 18  Temp: (!) 97.3 F (36.3 C)  TempSrc: Temporal  SpO2: 99%  Weight: 277 lb 1.6 oz (125.7 kg)  Height: '5\' 11"'$  (1.803 m)    General:  WDWN in NAD; vital signs documented above Gait: Not observed HENT: WNL, normocephalic Pulmonary: normal non-labored breathing , without Rales, rhonchi,  wheezing Cardiac: regular HR Abdomen: soft, NT, no masses Skin: without rashes Vascular Exam/Pulses:  Extremities: Venous ulceration of the right medial calf as well as the dorsal left  foot Musculoskeletal: no muscle wasting or atrophy  Neurologic: A&O X 3;  No focal weakness or paresthesias are detected Psychiatric:  The pt has Normal affect.  Right lower extremity venous reflux study negative for DVT Deep venous reflux noted due to chronic appearing thrombus    ASSESSMENT/PLAN:: 71 y.o. adult here for follow up for evaluation of bilateral lower extremity edema and venous ulcerations  -Patient was referred for evaluation for possible treatment for venous reflux.  Venous reflux study demonstrated chronic appearing thrombus in the right lower extremity contributing to his deep venous reflux.  He did not have a greater saphenous vein that would be adequate for laser ablation.  He has venous ulcerations involving the right medial calf and left dorsal foot which he states have been in Unna boots for the past 2 months.  He also underwent arterial imaging of his bilateral lower extremities in August of this year which demonstrated adequate circulation for wound healing.  His slow to heal wounds are probably related to his lack of  mobility.  He should continue his Unna boots.  He may require some acetic acid solution for the right medial calf wound due to a greenish hue which may indicate Pseudomonas involvement.  Otherwise, [facility] has nothing else to offer this patient.  Once his wounds have healed with the use of Unna boots he should return promptly to 20 to 30 mmHg knee-high compression stockings which should be worn daily.  He should also return to using his lymphedema pumps.  He should also be mindful of elevating his legs periodically throughout the day.  If at all possible he should try to increase his mobility at home however difficult due to his myositis.  Patient will follow-up on an as-needed basis.   Dagoberto Ligas, PA-C [facility] [phone]  Clinic MD:   Donzetta Matters

## 2022-05-05 NOTE — Progress Notes (Signed)
[name], RUBEN ([id number]) [id number] Page 1 of 10 Visit Report for 05/05/2022 Arrival Information Details Patient Name: Date of Service: [name], [name] 05/05/2022 12:30 PM Medical Record Number: [id number] Patient Account Number: [id number] Date of Birth/Sex: Treating RN: 03-06-1951 (71 y.o. [name] Primary Care Jarrod Bodkins: PA [name], [place] Other Clinician: Referring Turner Baillie: Treating Rolondo Pierre/Extender: [name] in Treatment: 12 Visit Information History Since Last Visit Added or deleted any medications: No Patient Arrived: Wheel Chair Any new allergies or adverse reactions: No Arrival Time: 12:38 Had a fall or experienced change in No Accompanied By: self activities of daily living that may affect Transfer Assistance: None risk of falls: Patient Identification Verified: Yes Signs or symptoms of abuse/neglect since last visito No Secondary Verification Process Completed: Yes Hospitalized since last visit: No Patient Requires Transmission-Based Precautions: No Implantable device outside of the clinic excluding No Patient Has Alerts: Yes cellular tissue based products placed in the center Patient Alerts: R ABI= N/C, TBI= .75 since last visit: L ABI =N/C, TBI = .57 Has Dressing in Place as Prescribed: Yes Has Compression in Place as Prescribed: No Pain Present Now: No Notes compression wraps removed this am for vascular studies Electronic Signature(s) Signed: 05/05/2022 5:37:12 PM By: Baruch Gouty RN, BSN Entered By: Baruch Gouty on 05/05/2022 12:40:54 -------------------------------------------------------------------------------- Compression Therapy Details Patient Name: Date of Service: [name], [name] 05/05/2022 12:30 PM Medical Record Number: [id number] Patient Account Number: [id number] Date of Birth/Sex: Treating RN: [birth year] (70 y.o. [name] Primary Care Lisabeth Mian: PA [name], [place] Other  Clinician: Referring Tatumn Corbridge: Treating Ashby Leflore/Extender: [name] in Treatment: 12 Compression Therapy Performed for Wound Assessment: Wound #1 Left,Dorsal Foot Performed By: Clinician Baruch Gouty, RN Compression Type: Three Layer Post Procedure Diagnosis Same as Pre-procedure Electronic Signature(s) Signed: 05/05/2022 5:37:12 PM By: Baruch Gouty RN, BSN Entered By: Baruch Gouty on 05/05/2022 12:58:45 [name] ([id number]) [id number] Page 2 of 10 -------------------------------------------------------------------------------- Compression Therapy Details Patient Name: Date of Service: [name], [name] 05/05/2022 12:30 PM Medical Record Number: [id number] Patient Account Number: [id number] Date of Birth/Sex: Treating RN: February 01, 1951 (71 y.o. [name] Primary Care Maisey Deandrade: PA [name], [place] Other Clinician: Referring Bernis Stecher: Treating Kahlan Engebretson/Extender: [name] in Treatment: 12 Compression Therapy Performed for Wound Assessment: Wound #3 Right,Medial Lower Leg Performed By: Clinician Baruch Gouty, RN Compression Type: Three Layer Post Procedure Diagnosis Same as Pre-procedure Electronic Signature(s) Signed: 05/05/2022 5:37:12 PM By: Baruch Gouty RN, BSN Entered By: Baruch Gouty on 05/05/2022 12:58:45 -------------------------------------------------------------------------------- Encounter Discharge Information Details Patient Name: Date of Service: [name], [name] 05/05/2022 12:30 PM Medical Record Number: [id number] Patient Account Number: [id number] Date of Birth/Sex: Treating RN: Jun 20, 1951 (71 y.o. [name] Primary Care Lyndsie Wallman: PA [name], [place] Other Clinician: Referring Ibeth Fahmy: Treating Zayden Maffei/Extender: [name] in Treatment: 12 Encounter Discharge Information Items Post Procedure Vitals Discharge Condition: Stable Temperature  (F): 97.7 Ambulatory Status: Wheelchair Pulse (bpm): 64 Discharge Destination: Home Respiratory Rate (breaths/min): 18 Transportation: Private Auto Blood Pressure (mmHg): 125/60 Accompanied By: self Schedule Follow-up Appointment: Yes Clinical Summary of Care: Patient Declined Electronic Signature(s) Signed: 05/05/2022 5:37:12 PM By: Baruch Gouty RN, BSN Entered By: Baruch Gouty on 05/05/2022 13:33:26 -------------------------------------------------------------------------------- Lower Extremity Assessment Details Patient Name: Date of Service: [name], [name] 05/05/2022 12:30 PM Medical Record Number: [id number] Patient Account Number: [id number] Date of Birth/Sex: Treating RN: 04-Feb-1951 (71 y.o. [name] Primary Care Natoria Archibald: PA [name], [place] Other Clinician: Referring Dianna Ewald: Treating Marnette Perkins/Extender: [name],  [name] in Treatment: 12 Edema Assessment Left: [Left: Right] [Right: :] Assessed: [Left: No] [Right: No] Edema: [Left: Yes] [Right: Yes] Calf Left: Right: Point of Measurement: From Medial Instep 41.4 cm 43.5 cm Ankle Left: Right: Point of Measurement: From Medial Instep 24.5 cm 26.5 cm Vascular Assessment Pulses: Dorsalis Pedis Palpable: [Left:Yes] [Right:Yes] Electronic Signature(s) Signed: 05/05/2022 5:37:12 PM By: Baruch Gouty RN, BSN Entered By: Baruch Gouty on 05/05/2022 12:50:12 -------------------------------------------------------------------------------- Multi Wound Chart Details Patient Name: Date of Service: [name] 05/05/2022 12:30 PM Medical Record Number: [id number] Patient Account Number: [id number] Date of Birth/Sex: Treating RN: January 13, 1951 (71 y.o. M) Primary Care Azazel Franze: PA [name], [place] Other Clinician: Referring Ileah Falkenstein: Treating Salman Wellen/Extender: [name], [name] in Treatment: 12 Vital Signs Height(in): Pulse(bpm): 31 Weight(lbs): Blood Pressure(mmHg): 125/60 Body  Mass Index(BMI): Temperature(F): 97.7 Respiratory Rate(breaths/min): 20 [1:Photos:] [N/A:N/A] Left, Dorsal Foot Right, Medial Lower Leg N/A Wound Location: Gradually Appeared Gradually Appeared N/A Wounding Event: Lymphedema Lymphedema N/A Primary Etiology: Cataracts, Anemia, Lymphedema, Cataracts, Anemia, Lymphedema, N/A Comorbid History: Congestive Heart Failure, Deep Vein Congestive Heart Failure, Deep Vein Thrombosis, Hypertension, Peripheral Thrombosis, Hypertension, Peripheral Venous Disease Venous Disease 12/07/2021 12/07/2021 N/A Date Acquired: 12 12 N/A Weeks of Treatment: Open Open N/A Wound Status: No No N/A Wound Recurrence: 1.7x1.7x0.2 3.2x4.8x0.8 N/A Measurements L x W x D (cm) 2.27 12.064 N/A A (cm) : rea 0.454 9.651 N/A Volume (cm) : 40.20% -125.90% N/A % Reduction in Area: 70.10% -351.80% N/A % Reduction in Volume: Full Thickness Without Exposed Full Thickness With Exposed Support N/A Classification: Support Structures Structures DEAUNDRE, ALLSTON ([id number]) [id number] Page 4 of 10 Medium Medium N/A Exudate Amount: Serosanguineous Serosanguineous N/A Exudate Type: red, brown red, brown N/A Exudate Color: Distinct, outline attached Distinct, outline attached N/A Wound Margin: Small (1-33%) Small (1-33%) N/A Granulation Amount: Red Red N/A Granulation Quality: Large (67-100%) Large (67-100%) N/A Necrotic Amount: Fat Layer (Subcutaneous Tissue): Yes Fat Layer (Subcutaneous Tissue): Yes N/A Exposed Structures: Fascia: No Fascia: No Tendon: No Tendon: No Muscle: No Muscle: No Joint: No Joint: No Bone: No Bone: No Small (1-33%) None N/A Epithelialization: Debridement - Excisional Debridement - Excisional N/A Debridement: Pre-procedure Verification/Time Out 12:55 12:55 N/A Taken: Lidocaine 4% Topical Solution Lidocaine 4% Topical Solution N/A Pain Control: Subcutaneous, Slough Other, Subcutaneous, Slough  N/A Tissue Debrided: Skin/Subcutaneous Tissue Skin/Subcutaneous Tissue N/A Level: 2.89 15.36 N/A Debridement A (sq cm): rea Curette Curette N/A Instrument: Minimum Minimum N/A Bleeding: Pressure Pressure N/A Hemostasis A chieved: 0 0 N/A Procedural Pain: 0 0 N/A Post Procedural Pain: Procedure was tolerated well Procedure was tolerated well N/A Debridement Treatment Response: 1.7x1.7x0.2 3.2x4.8x0.8 N/A Post Debridement Measurements L x W x D (cm) 0.454 9.651 N/A Post Debridement Volume: (cm) No Abnormalities Noted Excoriation: Yes N/A Periwound Skin Texture: No Abnormalities Noted No Abnormalities Noted N/A Periwound Skin Moisture: Erythema: Yes No Abnormalities Noted N/A Periwound Skin Color: No Abnormality No Abnormality N/A Temperature: Yes Yes N/A Tenderness on Palpation: Compression Therapy Compression Therapy N/A Procedures Performed: Debridement Debridement Treatment Notes Electronic Signature(s) Signed: 05/05/2022 1:29:33 PM By: Fredirick Maudlin MD FACS Entered By: Fredirick Maudlin on 05/05/2022 13:29:33 -------------------------------------------------------------------------------- Multi-Disciplinary Care Plan Details Patient Name: Date of Service: MERRIC, YOST 05/05/2022 12:30 PM Medical Record Number: [id number] Patient Account Number: [id number] Date of Birth/Sex: Treating RN: 1950-10-06 (71 y.o. [name] Primary Care Elizabelle Fite: PA [name], [place] Other Clinician: Referring Byrd Rushlow: Treating Nabil Bubolz/Extender: [name] in Treatment: 12 Multidisciplinary Care Plan reviewed with physician Active Inactive Venous Leg  Ulcer Nursing Diagnoses: Knowledge deficit related to disease process and management Potential for venous Insuffiency (use before diagnosis confirmed) Goals: Patient will maintain optimal edema control Date Initiated: 02/17/2022 Target Resolution Date: 06/02/2022 Goal Status:  Active Interventions: DANTRELL, SCHERTZER ([id number]) [phone].pdf Page 5 of 10 Assess peripheral edema status every visit. Compression as ordered Provide education on venous insufficiency Treatment Activities: Non-invasive vascular studies : 02/17/2022 Therapeutic compression applied : 02/17/2022 Notes: Wound/Skin Impairment Nursing Diagnoses: Impaired tissue integrity Knowledge deficit related to ulceration/compromised skin integrity Goals: Patient/caregiver will verbalize understanding of skin care regimen Date Initiated: 02/17/2022 Target Resolution Date: 06/02/2022 Goal Status: Active Ulcer/skin breakdown will have a volume reduction of 30% by week 4 Date Initiated: 02/17/2022 Date Inactivated: 03/10/2022 Target Resolution Date: 03/10/2022 Unmet Reason: infection being treated, Goal Status: Unmet waiting on derm appte Ulcer/skin breakdown will have a volume reduction of 50% by week 8 Date Initiated: 03/10/2022 Date Inactivated: 04/14/2022 Target Resolution Date: 04/07/2022 Goal Status: Unmet Unmet Reason: calcinosis Ulcer/skin breakdown will have a volume reduction of 80% by week 12 Date Initiated: 04/14/2022 Date Inactivated: 05/05/2022 Target Resolution Date: 05/05/2022 Unmet Reason: infection, chronic Goal Status: Unmet condition Interventions: Assess patient/caregiver ability to obtain necessary supplies Assess patient/caregiver ability to perform ulcer/skin care regimen upon admission and as needed Assess ulceration(s) every visit Provide education on ulcer and skin care Treatment Activities: Skin care regimen initiated : 02/17/2022 Topical wound management initiated : 02/17/2022 Notes: Electronic Signature(s) Signed: 05/05/2022 5:37:12 PM By: Baruch Gouty RN, BSN Entered By: Baruch Gouty on 05/05/2022 12:54:32 -------------------------------------------------------------------------------- Pain Assessment Details Patient Name: Date of  Service: CODIE, KROGH 05/05/2022 12:30 PM Medical Record Number: [id number] Patient Account Number: [id number] Date of Birth/Sex: Treating RN: [birth year] (71 y.o. [name] Primary Care Manolito Jurewicz: PA [name], [place] Other Clinician: Referring Brittney Mucha: Treating Laverne Klugh/Extender: [name] in Treatment: 12 Active Problems Location of Pain Severity and Description of Pain Patient Has Paino No Site Locations Rate the pain. DECARLOS, EMPEY ([id number]) [id number] Page 6 of 10 Rate the pain. Current Pain Level: 0 Character of Pain Describe the Pain: Tender Pain Management and Medication Current Pain Management: Electronic Signature(s) Signed: 05/05/2022 5:37:12 PM By: Baruch Gouty RN, BSN Entered By: Baruch Gouty on 05/05/2022 12:44:53 -------------------------------------------------------------------------------- Patient/Caregiver Education Details Patient Name: Date of Service: DAKHARI, ZUVER 10/11/2023andnbsp12:30 PM Medical Record Number: [id number] Patient Account Number: [id number] Date of Birth/Gender: Treating RN: 04/10/1951 (71 y.o. [name] Primary Care Physician: PA [name], [place] Other Clinician: Referring Physician: Treating Physician/Extender: [name] in Treatment: 12 Education Assessment Education Provided To: Patient Education Topics Provided Venous: Methods: Explain/Verbal Responses: Reinforcements needed, State content correctly Wound/Skin Impairment: Methods: Explain/Verbal Responses: Reinforcements needed, State content correctly Electronic Signature(s) Signed: 05/05/2022 5:37:12 PM By: Baruch Gouty RN, BSN Entered By: Baruch Gouty on 05/05/2022 12:55:04 [name] ([id number]) [id number] Page 7 of 10 -------------------------------------------------------------------------------- Wound Assessment Details Patient Name: Date of  Service: [name], [name] 05/05/2022 12:30 PM Medical Record Number: [id number] Patient Account Number: [id number] Date of Birth/Sex: Treating RN: 08-Feb-1951 (71 y.o. [name] Primary Care Havanna Groner: PA [name], [place] Other Clinician: Referring Madgeline Rayo: Treating Zannie Runkle/Extender: Valeria Batman Weeks in Treatment: 12 Wound Status Wound Number: 1 Primary Lymphedema Etiology: Wound Location: Left, Dorsal Foot Wound Open Wounding Event: Gradually Appeared Status: Date Acquired: 12/07/2021 Comorbid Cataracts, Anemia, Lymphedema, Congestive Heart Failure, Deep Weeks Of Treatment: 12 History: Vein Thrombosis, Hypertension, Peripheral Venous Disease Clustered Wound: No Photos Wound Measurements Length: (cm) 1.7 Width: (cm) 1.7  Depth: (cm) 0.2 Area: (cm) 2.27 Volume: (cm) 0.454 % Reduction in Area: 40.2% % Reduction in Volume: 70.1% Epithelialization: Small (1-33%) Tunneling: No Undermining: No Wound Description Classification: Full Thickness Without Exposed Suppor Wound Margin: Distinct, outline attached Exudate Amount: Medium Exudate Type: Serosanguineous Exudate Color: red, brown t Structures Foul Odor After Cleansing: No Slough/Fibrino Yes Wound Bed Granulation Amount: Small (1-33%) Exposed Structure Granulation Quality: Red Fascia Exposed: No Necrotic Amount: Large (67-100%) Fat Layer (Subcutaneous Tissue) Exposed: Yes Necrotic Quality: Adherent Slough Tendon Exposed: No Muscle Exposed: No Joint Exposed: No Bone Exposed: No Periwound Skin Texture Texture Color No Abnormalities Noted: Yes No Abnormalities Noted: No Erythema: Yes Moisture No Abnormalities Noted: Yes Temperature / Pain Temperature: No Abnormality Tenderness on Palpation: Yes Treatment Notes Wound #1 (Foot) Wound Laterality: Dorsal, Left Cleanser Peri-Wound Care Sween Lotion (Moisturizing lotion) Discharge Instruction: Apply moisturizing lotion as directed Topical JEFFERSON, FULLAM ([id number]) [id number] Page 8 of 10 Primary Dressing KerraCel Ag Gelling Fiber Dressing, 4x5 in (silver alginate) Discharge Instruction: Apply silver alginate to wound bed as instructed Secondary Dressing Woven Gauze Sponge, Non-Sterile 4x4 in Discharge Instruction: Apply over primary dressing as directed. Secured With Compression Wrap ThreePress (3 layer compression wrap) Discharge Instruction: Apply three layer compression as directed. Compression Stockings Add-Ons Electronic Signature(s) Signed: 05/05/2022 5:37:12 PM By: Baruch Gouty RN, BSN Entered By: Baruch Gouty on 05/05/2022 12:49:52 -------------------------------------------------------------------------------- Wound Assessment Details Patient Name: Date of Service: GRAYSEN, [name] 05/05/2022 12:30 PM Medical Record Number: [id number] Patient Account Number: [id number] Date of Birth/Sex: Treating RN: 01/21/1951 (71 y.o. [name] Primary Care Rayah Fines: PA [name], [place] Other Clinician: Referring Sabrine Patchen: Treating Khylei Wilms/Extender: [name] in Treatment: 12 Wound Status Wound Number: 3 Primary Lymphedema Etiology: Wound Location: Right, Medial Lower Leg Wound Open Wounding Event: Gradually Appeared Status: Date Acquired: 12/07/2021 Comorbid Cataracts, Anemia, Lymphedema, Congestive Heart Failure, Deep Weeks Of Treatment: 12 History: Vein Thrombosis, Hypertension, Peripheral Venous Disease Clustered Wound: No Photos Wound Measurements Length: (cm) 3.2 Width: (cm) 4.8 Depth: (cm) 0.8 Area: (cm) 12.064 Volume: (cm) 9.651 % Reduction in Area: -125.9% % Reduction in Volume: -351.8% Epithelialization: None Tunneling: No Undermining: No Wound Description Classification: Full Thickness With Exposed Support Structures Wound Margin: Distinct, outline attached Exudate Amount: Medium Exudate Type: Serosanguineous Exudate Color: red,  brown [place], [place] ([id number]) Foul Odor After Cleansing: No Slough/Fibrino Yes [id number] Page 9 of 10 Wound Bed Granulation Amount: Small (1-33%) Exposed Structure Granulation Quality: Red Fascia Exposed: No Necrotic Amount: Large (67-100%) Fat Layer (Subcutaneous Tissue) Exposed: Yes Necrotic Quality: Adherent Slough Tendon Exposed: No Muscle Exposed: No Joint Exposed: No Bone Exposed: No Periwound Skin Texture Texture Color No Abnormalities Noted: No No Abnormalities Noted: Yes Excoriation: Yes Temperature / Pain Temperature: No Abnormality Moisture No Abnormalities Noted: Yes Tenderness on Palpation: Yes Treatment Notes Wound #3 (Lower Leg) Wound Laterality: Right, Medial Cleanser Peri-Wound Care Triamcinolone 15 (g) Discharge Instruction: Use triamcinolone 15 (g) as directed Zinc Oxide Ointment 30g tube Discharge Instruction: Apply Zinc Oxide to periwound with each dressing change Sween Lotion (Moisturizing lotion) Discharge Instruction: Apply moisturizing lotion as directed Topical sodium thiosulfate ointment Discharge Instruction: apply to wound bed Primary Dressing Secondary Dressing ABD Pad, 8x10 Discharge Instruction: Apply over primary dressing as directed. Woven Gauze Sponge, Non-Sterile 4x4 in Discharge Instruction: Apply over primary dressing as directed. Secured With Compression Wrap ThreePress (3 layer compression wrap) Discharge Instruction: Apply three layer compression as directed. Compression Stockings Add-Ons Electronic Signature(s) Signed: 05/05/2022 5:37:12 PM By: Baruch Gouty RN,  BSN Entered By: Baruch Gouty on 05/05/2022 12:49:19 -------------------------------------------------------------------------------- Grant Details Patient Name: Date of Service: BACILIO, ABASCAL 05/05/2022 12:30 PM Medical Record Number: [id number] Patient Account Number: [id number] Date of Birth/Sex: Treating RN: 04-01-1951 (70  y.o. [name] Primary Care Kambra Beachem: PA [name], [place] Other Clinician: Referring Sanaa Zilberman: Treating Dietra Stokely/Extender: [name] in Treatment: [address], [place] ([id number]) [id number] Page 10 of 10 Vital Signs Time Taken: 12:40 Temperature (F): 97.7 Pulse (bpm): 64 Respiratory Rate (breaths/min): 20 Blood Pressure (mmHg): 125/60 Reference Range: 80 - 120 mg / dl Electronic Signature(s) Signed: 05/05/2022 5:37:12 PM By: Baruch Gouty RN, BSN Entered By: Baruch Gouty on 05/05/2022 12:41:23

## 2022-05-12 ENCOUNTER — Encounter (HOSPITAL_BASED_OUTPATIENT_CLINIC_OR_DEPARTMENT_OTHER): Payer: Medicare Other | Admitting: General Surgery

## 2022-05-12 DIAGNOSIS — L97522 Non-pressure chronic ulcer of other part of left foot with fat layer exposed: Secondary | ICD-10-CM | POA: Diagnosis not present

## 2022-05-12 DIAGNOSIS — I509 Heart failure, unspecified: Secondary | ICD-10-CM | POA: Diagnosis not present

## 2022-05-12 DIAGNOSIS — I890 Lymphedema, not elsewhere classified: Secondary | ICD-10-CM | POA: Diagnosis not present

## 2022-05-12 DIAGNOSIS — L97812 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d: Secondary | ICD-10-CM | POA: Diagnosis not present

## 2022-05-12 DIAGNOSIS — I110 Hypertensive heart disease with heart failure: Secondary | ICD-10-CM | POA: Diagnosis not present

## 2022-05-12 DIAGNOSIS — L942 Calcinosis cutis: Secondary | ICD-10-CM | POA: Diagnosis not present

## 2022-05-12 DIAGNOSIS — I872 Venous insufficiency (chronic) (peripheral): Secondary | ICD-10-CM | POA: Diagnosis not present

## 2022-05-13 NOTE — Progress Notes (Signed)
[name] ([id number]) [id number] Page 1 of 14 Visit Report for 05/12/2022 Chief Complaint Document Details Patient Name: Date of Service: [name], [name] 05/12/2022 10:30 A M Medical Record Number: [id number] Patient Account Number: [id number] Date of Birth/Sex: Treating RN: 1950/09/09 (71 y.o. M) Primary Care Provider: PA [name], NO Other Clinician: Referring Provider: Treating Provider/Extender: [name] in Treatment: 13 Information Obtained from: Patient Chief Complaint Patient seen for complaints of Non-Healing Wounds. Electronic Signature(s) Signed: 05/12/2022 11:37:09 AM By: Fredirick Maudlin MD FACS Entered By: Fredirick Maudlin on 05/12/2022 11:37:09 -------------------------------------------------------------------------------- Debridement Details Patient Name: Date of Service: [name] 05/12/2022 10:30 A M Medical Record Number: [id number] Patient Account Number: [id number] Date of Birth/Sex: Treating RN: August 04, 1950 (71 y.o. [name] Primary Care Provider: PA [name], [place] Other Clinician: Referring Provider: Treating Provider/Extender: [name] in Treatment: 13 Debridement Performed for Assessment: Wound #3 Right,Medial Lower Leg Performed By: Physician Fredirick Maudlin, MD Debridement Type: Debridement Level of Consciousness (Pre-procedure): Awake and Alert Pre-procedure Verification/Time Out Yes - 11:20 Taken: Start Time: 11:23 Pain Control: Lidocaine 4% T opical Solution T Area Debrided (L x W): otal 3 (cm) x 4.3 (cm) = 12.9 (cm) Tissue and other material debrided: Viable, Non-Viable, Slough, Subcutaneous, Slough, Other: calcium deposits Level: Skin/Subcutaneous Tissue Debridement Description: Excisional Instrument: Curette Specimen: Tissue Culture Number of Specimens T aken: 1 Bleeding: Minimum Hemostasis Achieved: Pressure Procedural Pain: 0 Post Procedural  Pain: 0 Response to Treatment: Procedure was tolerated well Level of Consciousness (Post- Awake and Alert procedure): Post Debridement Measurements of Total Wound Length: (cm) 3 Width: (cm) 4.3 Depth: (cm) 0.7 Volume: (cm) 7.092 Character of Wound/Ulcer Post Debridement: Improved LEDELL, CODRINGTON ([id number]) [id number] Page 2 of 14 Post Procedure Diagnosis Same as Pre-procedure Notes : scribed by Baruch Gouty, RN for Dr. [name] Electronic Signature(s) Signed: 05/12/2022 12:35:35 PM By: Fredirick Maudlin MD FACS Signed: 05/12/2022 5:44:27 PM By: Baruch Gouty RN, BSN Entered By: Baruch Gouty on 05/12/2022 11:29:00 -------------------------------------------------------------------------------- Debridement Details Patient Name: Date of Service: [name] 05/12/2022 10:30 A M Medical Record Number: [id number] Patient Account Number: [id number] Date of Birth/Sex: Treating RN: November 15, 1950 (71 y.o. [name] Primary Care Provider: PA [name], [place] Other Clinician: Referring Provider: Treating Provider/Extender: [name] in Treatment: 13 Debridement Performed for Assessment: Wound #1 Left,Dorsal Foot Performed By: Physician Fredirick Maudlin, MD Debridement Type: Debridement Level of Consciousness (Pre-procedure): Awake and Alert Pre-procedure Verification/Time Out Yes - 11:20 Taken: Start Time: 11:23 Pain Control: Lidocaine 4% T opical Solution T Area Debrided (L x W): otal 2 (cm) x 1.5 (cm) = 3 (cm) Tissue and other material debrided: Non-Viable, Slough, Slough Level: Non-Viable Tissue Debridement Description: Selective/Open Wound Instrument: Curette Bleeding: Minimum Hemostasis Achieved: Pressure Procedural Pain: 0 Post Procedural Pain: 0 Response to Treatment: Procedure was tolerated well Level of Consciousness (Post- Awake and Alert procedure): Post Debridement Measurements of Total Wound Length: (cm)  2 Width: (cm) 1.5 Depth: (cm) 0.2 Volume: (cm) 0.471 Character of Wound/Ulcer Post Debridement: Improved Post Procedure Diagnosis Same as Pre-procedure Notes : scribed by Baruch Gouty, RN for Dr. [name] Electronic Signature(s) Signed: 05/12/2022 12:35:35 PM By: Fredirick Maudlin MD FACS Signed: 05/12/2022 5:44:27 PM By: Baruch Gouty RN, BSN Entered By: Baruch Gouty on 05/12/2022 11:29:11 [name] ([id number]) [id number] Page 3 of 14 -------------------------------------------------------------------------------- Debridement Details Patient Name: Date of Service: [name] 05/12/2022 10:30 A M Medical Record Number: [id number] Patient Account Number: [id number] Date of Birth/Sex: Treating RN: Sep 25, 1950 [phone]  y.o. [name] Primary Care Provider: PA [name], [place] Other Clinician: Referring Provider: Treating Provider/Extender: [name] in Treatment: 13 Debridement Performed for Assessment: Wound #6 Right,Anterior Lower Leg Performed By: Physician Fredirick Maudlin, MD Debridement Type: Debridement Level of Consciousness (Pre-procedure): Awake and Alert Pre-procedure Verification/Time Out Yes - 11:20 Taken: Start Time: 11:23 Pain Control: Lidocaine 4% T opical Solution T Area Debrided (L x W): otal 0.5 (cm) x 0.6 (cm) = 0.3 (cm) Tissue and other material debrided: Non-Viable, Slough, Slough Level: Non-Viable Tissue Debridement Description: Selective/Open Wound Instrument: Curette Bleeding: Minimum Hemostasis Achieved: Pressure Procedural Pain: 0 Post Procedural Pain: 0 Response to Treatment: Procedure was tolerated well Level of Consciousness (Post- Awake and Alert procedure): Post Debridement Measurements of Total Wound Length: (cm) 0.5 Width: (cm) 0.6 Depth: (cm) 0.1 Volume: (cm) 0.024 Character of Wound/Ulcer Post Debridement: Improved Post Procedure Diagnosis Same as  Pre-procedure Notes : scribed by Baruch Gouty, RN for Dr. [name] Electronic Signature(s) Signed: 05/12/2022 12:35:35 PM By: Fredirick Maudlin MD FACS Signed: 05/12/2022 5:44:27 PM By: Baruch Gouty RN, BSN Entered By: Baruch Gouty on 05/12/2022 11:29:36 -------------------------------------------------------------------------------- HPI Details Patient Name: Date of Service: [name] 05/12/2022 10:30 A M Medical Record Number: [id number] Patient Account Number: [id number] Date of Birth/Sex: Treating RN: March 02, 1951 (71 y.o. M) Primary Care Provider: PA [name], NO Other Clinician: Referring Provider: Treating Provider/Extender: [name] in Treatment: 13 History of Present Illness HPI Description: ADMISSION 02/09/2022 This is a 71 year old man who recently moved to [place] from [place]. He has a history of of coronary artery disease and prior left popliteal vein DVT . He is not diabetic and does not smoke. 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 He has had multiple spinal surgeries and has limited mobility. He primarily uses a motorized wheelchair. He has had lymphedema for many years and does have lymphedema pumps although he says he does not use them frequently. He has wounds on his bilateral lower extremities. He was receiving care in [place] for these prior to his move. They were apparently packing his wounds with Betadine soaked gauze. He has worn compression wraps in the past but not recently. He did say that they helped tremendously. In clinic today, his right ABI is noncompressible but has good signals; the left ABI was 1.17. No formal vascular studies have been performed. ROMNEY, COMPEAN ([id number]) [id number] Page 4 of 14 On his left dorsal foot, there is a circular lesion that exposes the fat layer. There is fairly heavy slough accumulation within the wound, but no significant odor. On his right  medial calf, he has multiple pinholes that have slough buildup in them and probe for about 2 to 4 mm in depth. They are draining clear fluid. On his right lateral midfoot, he has ulceration consistent with venous stasis. It is geographic and has slough accumulation. 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 02/17/2022: All of the wounds are little bit larger today and have more slough accumulation. Today, the medial calf openings are larger and probe to something that feels calcified. There is odor coming from his wounds. His vascular studies are scheduled for August 9 and 10. 02-24-2022 upon evaluation today patient presents for follow-up concerning his ongoing issues here with his lower extremities. With that being said he does have significant problems with what appears to be cellulitis unfortunately the PCR culture that was  sent last week got crushed by [employer] in transit. With that being said we are going to reobtain a culture today although I am actually to do it on the right medial leg as this seems to be the most inflamed area today where there is some questionable drainage as well. I am going to remove some of the calcium deposits which are loosening obtain a deeper culture from this area. Fortunately I do not see any evidence of active infection systemically which is good news but locally I think we are having a bigger issue here. He does have his appointment next Thursday with vascular. Patient has also been referred to [facility] for further evaluation and treatment of the calcinosis for possible sulfate injections. 03/04/2022: The culture that was performed last week grew out multiple species including Klebsiella, Morganella, and Pseudomonas aeruginosa. He had been prescribed Bactrim but this was not adequate to cover all of the species and levofloxacin was recommended. He completed the Bactrim but did not  pick up the levofloxacin and begin taking it until yesterday. 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 He had arterial studies performed today. He does have evidence of peripheral arterial disease. Results copied here: +-------+-----------+-----------+------------+------------+ ABI/TBIT oday's ABIT oday's TBIPrevious ABIPrevious TBI +-------+-----------+-----------+------------+------------+ Right Waterflow 0.75    +-------+-----------+-----------+------------+------------+ Left Grawn 0.57    +-------+-----------+-----------+------------+------------+ Arterial wall calcification precludes accurate ankle pressures and ABIs. Summary: Right: Resting right ankle-brachial index indicates noncompressible right lower extremity arteries. The right toe-brachial index is normal. Left: Resting left ankle-brachial index indicates noncompressible left lower extremity arteries. The left toe-brachial index is abnormal. He continues to have further tissue breakdown at the right medial calf and there is ample slough accumulation along with necrotic subcutaneous tissue. The left dorsal foot wound has built up slough, as well. The right medial foot wound is nearly closed with just a light layer of eschar over the surface. The right dorsal lateral foot wound has also accumulated a fair amount of slough and remains quite tender. 03/10/2022: He has been taking the prescribed levofloxacin and has about 3 days left of this. The erythema and induration on his right leg has improved. He continues to experience further breakdown of the right medial calf wound. There is extensive slough and debris accumulation there. There is heavy slough on his left dorsal foot wound, but no odor or purulent drainage. The right medial foot wound appears to be  closed. The right dorsal lateral foot wound also has a fair amount of slough but it is less tender than at our last visit. He still does not have an appointment with dermatology. 03/22/2022: The right anterior tibial wound has closed. The right lateral foot wound is nearly closed with just a little bit of slough. The left dorsal foot wound is smaller and continues to have some slough buildup but less so than at prior visits. There is good granulation tissue underlying the slough. Unfortunately the right medial calf wound continues to deteriorate. It has a foul odor today and a lot of necrotic tissue, the surrounding skin is also erythematous and moist. 03/30/2022: The right lateral foot wound continues to contract and just has a little bit of slough and eschar present. The left dorsal foot wound is also smaller with slough overlying good granulation tissue. The right medial calf wound actually looks quite a bit better today; there is  no odor and there is much less necrotic tissue although some remains present. We have been using topical gentamicin and he has been taking oral Augmentin. 04/07/2022: The patient saw dermatology at [facility] last week. Unfortunately, it appears that they did not read any of the documentation that was provided. 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 Overall, it was a highly unsatisfactory consultation. In the meantime, however, we were able to identify compounding pharmacy who could create a topical sodium thiosulfate ointment. The patient has that with him today. Both of the right foot wounds are now closed. The left dorsal foot wound has contracted but still has a layer of slough on the surface. The  medial calf wounds on the right all look a little bit better. They still have heavy slough along with the chunks of calcium. Everything is stained purple. 04/14/2022: The wound on his dorsal left foot is a little bit smaller again today. There is still slough accumulation on the surface. The medial calf wounds have quite a bit less slough accumulation today. His right leg, however, is warm and erythematous, concerning for cellulitis. 04/14/2022: He completed his course of Augmentin yesterday. The medial calf wound sites are much cleaner today and shallower. There is less fragmented calcium in the wounds. He has a lot of lymphedema fluid-like drainage from these wounds, but no purulent discharge or malodor. The wound on his dorsal left foot has also contracted and is shallower. There is a layer of slough over healthy granulation tissue. 05/05/2022: The left dorsal foot wound is smaller today with less slough and more granulation tissue. The right medial calf wounds are shallower, but have extensive slough and some fat necrosis. The drainage has diminished considerably, however. 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 05/12/2022: The left dorsal foot wound continues to contract and fill with granulation tissue. There is slough on the wound surface. The right medial calf wounds also continue to fill with healthy tissue, but there is remaining slough and fat necrosis present. He had a significant increase in his drainage this week and it was a bright yellow-green color. He also had a new wound open over his right anterior tibial surface associated with the spike of cutaneous calcium. The leg is more red, as well, concerning for infection. Electronic Signature(s) Signed: 05/12/2022 11:38:22 AM By: Fredirick Maudlin MD FACS Entered By: Fredirick Maudlin on 05/12/2022 11:38:22 [name]  ([id number]) [id number] Page 5 of 14 -------------------------------------------------------------------------------- Physical Exam Details Patient Name: Date of Service: ARBOR, COHEN 05/12/2022 10:30 A M Medical Record Number: [id number] Patient Account Number: [id number] Date of Birth/Sex: Treating RN: 04-Jun-1951 (71 y.o. M) Primary Care Provider: PA [name], NO Other Clinician: Referring Provider: Treating Provider/Extender: [name], [name] in Treatment: 13 Constitutional . . . . No acute distress.Marland Kitchen Respiratory Normal work of breathing on room air.. Notes 05/12/2022: The left dorsal foot wound continues to contract and fill with granulation tissue. There is slough on the wound surface. The right medial calf wounds also continue to fill with healthy tissue, but there is remaining slough and fat necrosis present. He had a significant increase in his  drainage this week and it was a bright yellow-green color. He also had a new wound open over his right anterior tibial surface associated with the spike of cutaneous calcium. The leg is more red, as well, concerning for infection. Electronic Signature(s) Signed: 05/12/2022 11:45:14 AM By: Fredirick Maudlin MD FACS Entered By: Fredirick Maudlin on 05/12/2022 11:45:14 -------------------------------------------------------------------------------- Physician Orders Details Patient Name: Date of Service: [name] 05/12/2022 10:30 A M Medical Record Number: [id number] Patient Account Number: [id number] Date of Birth/Sex: Treating RN: 1951-03-20 (71 y.o. 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 - Dr. [name] Rm 1 Return A Anesthetic Wound #1 Left,Dorsal Foot (In clinic) Topical Lidocaine 4% applied to wound bed Wound #3 Right,Medial Lower Leg (In clinic) Topical Lidocaine 4% applied to wound bed ELIAH, MARQUARD ([id number]) [id number] Page 6 of 14 Bathing/ Shower/ Hygiene May shower with protection but do not get wound dressing(s) wet. Edema Control - Lymphedema / SCD / Other Lymphedema Pumps. Use Lymphedema pumps on leg(s) 2-3 times a day for 45-60 minutes. If wearing any wraps or hose, do not remove them. Continue exercising as instructed. - use 1-2 times per day over wraps Elevate legs to the level of the heart or above for 30 minutes daily and/or when sitting, a frequency of: Exercise regularly Wound Treatment Wound #1 - Foot Wound Laterality: Dorsal, Left Peri-Wound Care: Sween Lotion (Moisturizing lotion) 1 x Per Week Discharge Instructions: Apply moisturizing lotion as directed Prim Dressing: KerraCel Ag Gelling Fiber Dressing, 4x5 in (silver alginate) 1 x Per Week ary Discharge Instructions: Apply silver alginate to wound bed as instructed Secondary Dressing: Woven Gauze Sponge, Non-Sterile 4x4 in 1 x Per Week Discharge Instructions: Apply over primary dressing as directed. Compression Wrap: ThreePress (3 layer compression wrap) 1 x Per Week Discharge Instructions: Apply three layer compression as directed. Wound #3 - Lower Leg Wound Laterality: Right, Medial Peri-Wound Care: Zinc Oxide Ointment 30g tube  1 x Per Week Discharge Instructions: Apply Zinc Oxide to periwound with each dressing change Peri-Wound Care: Sween Lotion (Moisturizing lotion) 1 x Per Week Discharge Instructions: Apply moisturizing lotion as directed Topical: Gentamicin 1 x Per Week Discharge Instructions: mix with sodium thiosulfate 1:1 Topical: sodium thiosulfate ointment 1 x Per Week Discharge Instructions: apply to wound bed Secondary Dressing: ABD Pad, 8x10 1 x Per Week Discharge Instructions: Apply over primary dressing as directed. Secondary Dressing: Woven Gauze Sponge, Non-Sterile 4x4 in 1 x Per Week Discharge Instructions: Apply over primary dressing as directed. Compression Wrap: ThreePress (3 layer compression wrap) 1 x Per Week Discharge Instructions: Apply three layer compression as directed. Wound #6 - Lower Leg Wound Laterality: Right, Anterior Peri-Wound Care: Zinc Oxide Ointment 30g tube 1 x Per Week Discharge Instructions: Apply Zinc Oxide to periwound with each dressing change Peri-Wound Care: Sween Lotion (Moisturizing lotion) 1 x Per Week Discharge Instructions: Apply moisturizing lotion as directed Topical: Gentamicin 1 x Per Week Discharge Instructions:  mix with sodium thiosulfate 1:1 Topical: sodium thiosulfate ointment 1 x Per Week Discharge Instructions: apply to wound bed Secondary Dressing: ABD Pad, 8x10 1 x Per Week Discharge Instructions: Apply over primary dressing as directed. Secondary Dressing: Woven Gauze Sponge, Non-Sterile 4x4 in 1 x Per Week Discharge Instructions: Apply over primary dressing as directed. Compression Wrap: ThreePress (3 layer compression wrap) 1 x Per Week Discharge Instructions: Apply three layer compression as directed. Laboratory naerobe culture (MICRO) - right medial lower leg Bacteria identified in Unspecified specimen by A LOINC Code: 737-1 Convenience Name: Anaerobic culture Patient Medications DRAPER, GALLON ([id number])  [id number] Page 7 of 14 llergies: No Known Allergies A Notifications Medication Indication Start End 05/12/2022 levofloxacin DOSE oral 750 mg tablet - 1 tab p.o. daily x10 days Electronic Signature(s) Signed: 05/12/2022 5:44:27 PM By: Baruch Gouty RN, BSN Signed: 05/13/2022 11:30:57 AM By: Fredirick Maudlin MD FACS Previous Signature: 05/12/2022 12:35:35 PM Version By: Fredirick Maudlin MD FACS Previous Signature: 05/12/2022 11:46:33 AM Version By: Fredirick Maudlin MD FACS Entered By: Baruch Gouty on 05/12/2022 17:24:13 -------------------------------------------------------------------------------- Problem List Details Patient Name: Date of Service: [name], [name] 05/12/2022 10:30 A M Medical Record Number: [id number] Patient Account Number: [id number] Date of Birth/Sex: Treating RN: October 29, 1950 (71 y.o. [name] Primary Care Provider: PA [name], [place] Other Clinician: Referring Provider: Treating Provider/Extender: [name] in Treatment: 13 Active Problems ICD-10 Encounter Code Description Active Date MDM Diagnosis L97.812 Non-pressure chronic ulcer of other part of right lower leg with fat layer 02/09/2022 No Yes exposed L97.522 Non-pressure chronic ulcer of other part of left foot with fat layer exposed 02/09/2022 No Yes 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 02/09/2022 No Yes 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) 02/09/2022 No Yes I10 Essential (primary) hypertension 02/09/2022 No Yes I25.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 02/09/2022 No Yes I89.0 Lymphedema, not elsewhere classified 02/09/2022 No Yes Z86.718 Personal history of other venous thrombosis and embolism 02/09/2022 No Yes Inactive Problems ICD-10 Code Description Active Date Inactive Date L97.512 Non-pressure chronic ulcer of other part of right foot with fat layer exposed 02/17/2022 02/17/2022 [name]  ([id number]) [id number] Page 8 of 14 Resolved Problems Electronic Signature(s) Signed: 05/12/2022 11:36:49 AM By: Fredirick Maudlin MD FACS Entered By: Fredirick Maudlin on 05/12/2022 11:36:49 -------------------------------------------------------------------------------- Progress Note Details Patient Name: Date of Service: [name] 05/12/2022 10:30 A M Medical Record Number: [id number] Patient Account Number: [id number] Date of Birth/Sex: Treating RN: [birth year] (71 y.o. M) Primary Care Provider: PA [name], NO Other Clinician: Referring Provider: Treating Provider/Extender: [name] in Treatment: 13 Subjective Chief Complaint Information obtained from Patient Patient seen for complaints of Non-Healing Wounds. History of Present Illness (HPI) ADMISSION 02/09/2022 This is a 71 year old man who recently moved to [place] from [place]. He has a history of of coronary artery disease and prior left popliteal vein DVT . He is not diabetic and does not smoke. 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 He has had multiple spinal surgeries and has limited mobility. He primarily uses a motorized wheelchair. He has had lymphedema for many years and does have lymphedema pumps although he says he does not use them frequently. He has wounds on his bilateral lower extremities. He was receiving care in [place] for these prior to his move. They were apparently packing his wounds with Betadine soaked gauze. He has worn compression wraps in the past but not recently. He did say that they helped tremendously. In clinic  today, his right ABI is noncompressible but has good signals; the left ABI was 1.17. No formal vascular studies have been performed. On his left dorsal foot, there is a circular lesion that exposes the fat layer. There is fairly heavy slough accumulation within the wound, but no significant odor. On his right medial  calf, he has multiple pinholes that have slough buildup in them and probe for about 2 to 4 mm in depth. They are draining clear fluid. On his right lateral midfoot, he has ulceration consistent with venous stasis. It is geographic and has slough accumulation. 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 02/17/2022: All of the wounds are little bit larger today and have more slough accumulation. Today, the medial calf openings are larger and probe to something that feels calcified. There is odor coming from his wounds. His vascular studies are scheduled for August 9 and 10. 02-24-2022 upon evaluation today patient presents for follow-up concerning his ongoing issues here with his lower extremities. With that being said he does have significant problems with what appears to be cellulitis unfortunately the PCR culture that was sent last week got crushed by [employer] in transit. With that being said we are going to reobtain a culture today although I am actually to do it on the right medial leg as this seems to be the most inflamed area today where there is some questionable drainage as well. I am going to remove some of the calcium deposits which are loosening obtain a deeper culture from this area. Fortunately I do not see any evidence of active infection systemically which is good news but locally I think we are having a bigger issue here. He does have his appointment next Thursday with vascular. Patient has also been referred to [facility] for further evaluation and treatment of the calcinosis for possible sulfate injections. 03/04/2022: The culture that was performed last week grew out multiple species including Klebsiella, Morganella, and Pseudomonas aeruginosa. He had been prescribed Bactrim but this was not adequate to cover all of the species and levofloxacin was recommended. He completed the Bactrim but did not pick up  the levofloxacin and begin taking it until yesterday. 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 He had arterial studies performed today. He does have evidence of peripheral arterial disease. Results copied here: +-------+-----------+-----------+------------+------------+ ABI/TBIT oday's ABIT oday's TBIPrevious ABIPrevious TBI +-------+-----------+-----------+------------+------------+ Right [place] 0.75    +-------+-----------+-----------+------------+------------+ Left [place] 0.57    +-------+-----------+-----------+------------+------------+ Arterial wall calcification precludes accurate ankle pressures and ABIs. Summary: Right: Resting right ankle-brachial index indicates noncompressible right lower extremity arteries. The right toe-brachial index is normal. Left: Resting left ankle-brachial index indicates noncompressible left JUDSON, TSAN ([id number]) [id number] Page 9 of 14 lower extremity arteries. The left toe-brachial index is abnormal. He continues to have further tissue breakdown at the right medial calf and there is ample slough accumulation along with necrotic subcutaneous tissue. The left dorsal foot wound has built up slough, as well. The right medial foot wound is nearly closed with just a light layer of eschar over the surface. The right dorsal lateral foot wound has also accumulated a fair amount of slough and remains quite tender. 03/10/2022: He has been taking the prescribed levofloxacin and has about 3 days left of this. The erythema and induration on  his right leg has improved. He continues to experience further breakdown of the right medial calf wound. There is extensive slough and debris accumulation there. There is heavy slough on his left dorsal foot wound, but no  odor or purulent drainage. The right medial foot wound appears to be closed. The right dorsal lateral foot wound also has a fair amount of slough but it is less tender than at our last visit. He still does not have an appointment with dermatology. 03/22/2022: The right anterior tibial wound has closed. The right lateral foot wound is nearly closed with just a little bit of slough. The left dorsal foot wound is smaller and continues to have some slough buildup but less so than at prior visits. There is good granulation tissue underlying the slough. Unfortunately the right medial calf wound continues to deteriorate. It has a foul odor today and a lot of necrotic tissue, the surrounding skin is also erythematous and moist. 03/30/2022: The right lateral foot wound continues to contract and just has a little bit of slough and eschar present. The left dorsal foot wound is also smaller with slough overlying good granulation tissue. The right medial calf wound actually looks quite a bit better today; there is no odor and there is much less necrotic tissue although some remains present. We have been using topical gentamicin and he has been taking oral Augmentin. 04/07/2022: The patient saw dermatology at [facility] last week. Unfortunately, it appears that they did not read any of the documentation that was provided. 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 Overall, it was a highly unsatisfactory consultation. In the meantime, however, we were able to identify compounding pharmacy who could create a topical sodium thiosulfate ointment. The patient has that with him today. Both of the right foot wounds are now closed. The left dorsal foot wound  has contracted but still has a layer of slough on the surface. The medial calf wounds on the right all look a little bit better. They still have heavy slough along with the chunks of calcium. Everything is stained purple. 04/14/2022: The wound on his dorsal left foot is a little bit smaller again today. There is still slough accumulation on the surface. The medial calf wounds have quite a bit less slough accumulation today. His right leg, however, is warm and erythematous, concerning for cellulitis. 04/14/2022: He completed his course of Augmentin yesterday. The medial calf wound sites are much cleaner today and shallower. There is less fragmented calcium in the wounds. He has a lot of lymphedema fluid-like drainage from these wounds, but no purulent discharge or malodor. The wound on his dorsal left foot has also contracted and is shallower. There is a layer of slough over healthy granulation tissue. 05/05/2022: The left dorsal foot wound is smaller today with less slough and more granulation tissue. The right medial calf wounds are shallower, but have extensive slough and some fat necrosis. The drainage has diminished considerably, however. 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 05/12/2022: The left dorsal foot wound continues to contract and fill with granulation tissue. There is slough on the wound surface. The right  medial calf wounds also continue to fill with healthy tissue, but there is remaining slough and fat necrosis present. He had a significant increase in his drainage this week and it was a bright yellow-green color. He also had a new wound open over his right anterior tibial surface associated with the spike of cutaneous calcium. The leg is more red, as well, concerning for infection. Patient History Information obtained from Patient, Chart. Family History Cancer -  Mother, Heart Disease - Father,Mother, Hypertension - Father,Siblings, Lung Disease - Siblings, No family history of Diabetes, Hereditary Spherocytosis, Kidney Disease, Seizures, Stroke, Thyroid Problems, Tuberculosis. Social History Former smoker - quit 1991, Marital Status - Married, Alcohol Use - Moderate, Drug Use - No History, Caffeine Use - Daily - coffee. Medical History Eyes Patient has history of Cataracts - right eye removed Denies history of Glaucoma, Optic Neuritis Ear/Nose/Mouth/Throat Denies history of Chronic sinus problems/congestion, Middle ear problems Hematologic/Lymphatic Patient has history of Anemia - macrocytic, Lymphedema Cardiovascular Patient has history of Congestive Heart Failure, Deep Vein Thrombosis - left leg, Hypertension, Peripheral Venous Disease Endocrine Denies history of Type I Diabetes, Type II Diabetes Genitourinary Denies history of End Stage Renal Disease Integumentary (Skin) Denies history of History of Burn Oncologic Denies history of Received Chemotherapy, Received Radiation Psychiatric Denies history of Anorexia/bulimia, Confinement Anxiety Hospitalization/Surgery History - cervical fusion. - lumbar surgery. - coronary stent placement. - lasik eye surgery. - hydrocele excision. Medical A Surgical History Notes nd Constitutional Symptoms (General Health) obesity Ear/Nose/Mouth/Throat hard of hearing Respiratory frozen diaphragm right Cardiovascular hyperlipidemia Musculoskeletal myositis, lumbar DDD, spinal stenosis, cervical facet joint syndrome EFRAIM, VANALLEN ([id number]) [id number] Page 10 of 14 Objective Constitutional No acute distress.. Vitals Time Taken: 10:33 AM, Height: 71 in, Source: Stated, Weight: 267 lbs, Source: Stated, BMI: 37.2, Temperature: 97.4 F, Pulse: 62 bpm, Respiratory Rate: 20 breaths/min, Blood Pressure: 135/67 mmHg. Respiratory Normal work of breathing on room air.. General  Notes: 05/12/2022: The left dorsal foot wound continues to contract and fill with granulation tissue. There is slough on the wound surface. The right medial calf wounds also continue to fill with healthy tissue, but there is remaining slough and fat necrosis present. He had a significant increase in his drainage this week and it was a bright yellow-green color. He also had a new wound open over his right anterior tibial surface associated with the spike of cutaneous calcium. The leg is more red, as well, concerning for infection. Integumentary (Hair, Skin) Wound #1 status is Open. Original cause of wound was Gradually Appeared. The date acquired was: 12/07/2021. The wound has been in treatment 13 weeks. The wound is located on the Left,Dorsal Foot. The wound measures 2cm length x 1.5cm width x 0.2cm depth; 2.356cm^2 area and 0.471cm^3 volume. There is Fat Layer (Subcutaneous Tissue) exposed. There is no tunneling or undermining noted. There is a medium amount of serosanguineous drainage noted. The wound margin is distinct with the outline attached to the wound base. There is medium (34-66%) red, pink granulation within the wound bed. There is a medium (34-66%) amount of necrotic tissue within the wound bed including Adherent Slough. The periwound skin appearance had no abnormalities noted for texture. The periwound skin appearance had no abnormalities noted for moisture. The periwound skin appearance exhibited: Erythema. The surrounding wound skin color is noted with erythema. Periwound temperature was noted as No Abnormality. The periwound has tenderness on palpation. Wound #3 status is Open. Original cause of wound was Gradually Appeared. The date acquired was: 12/07/2021.  The wound has been in treatment 13 weeks. The wound is located on the Right,Medial Lower Leg. The wound measures 3cm length x 4.3cm width x 0.7cm depth; 10.132cm^2 area and 7.092cm^3 volume. There is Fat Layer (Subcutaneous Tissue)  exposed. There is no tunneling or undermining noted. There is a large amount of purulent drainage noted. The wound margin is distinct with the outline attached to the wound base. There is medium (34-66%) red, friable granulation within the wound bed. There is a medium (34- 66%) amount of necrotic tissue within the wound bed including Adherent Slough. The periwound skin appearance had no abnormalities noted for texture. The periwound skin appearance had no abnormalities noted for moisture. The periwound skin appearance exhibited: Hemosiderin Staining, Erythema. The surrounding wound skin color is noted with erythema with red streaks. Periwound temperature was noted as Hot. The periwound has tenderness on palpation. Wound #6 status is Open. Original cause of wound was Gradually Appeared. The date acquired was: 05/12/2022. The wound is located on the Right,Anterior Lower Leg. The wound measures 0.5cm length x 0.6cm width x 0.1cm depth; 0.236cm^2 area and 0.024cm^3 volume. There is Fat Layer (Subcutaneous Tissue) exposed. There is no tunneling or undermining noted. There is a medium amount of serosanguineous drainage noted. The wound margin is distinct with the outline attached to the wound base. There is large (67-100%) red, friable granulation within the wound bed. There is a small (1-33%) amount of necrotic tissue within the wound bed including Adherent Slough. The periwound skin appearance had no abnormalities noted for moisture. The periwound skin appearance exhibited: Hemosiderin Staining. Periwound temperature was noted as No Abnormality. The periwound has tenderness on palpation. 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 There was a Selective/Open Wound Non-Viable Tissue Debridement with a total area of 3 sq cm performed by Fredirick Maudlin, MD. With the following instrument(s): Curette to remove Non-Viable tissue/material. Material removed includes [facility] after achieving pain control using Lidocaine 4% Topical Solution. No specimens were taken. A time out was conducted at 11:20, prior to the start of the procedure. A Minimum amount of bleeding was controlled with Pressure. The procedure was tolerated well with a pain level of 0 throughout and a pain level of 0 following the procedure. Post Debridement Measurements: 2cm length x 1.5cm width x 0.2cm depth; 0.471cm^3 volume. Character of Wound/Ulcer Post Debridement is improved. Post procedure Diagnosis Wound #1: Same as Pre-Procedure General Notes: : scribed by Baruch Gouty, RN for Dr. [name]. Pre-procedure diagnosis of Wound #1 is a Lymphedema located on the Left,Dorsal Foot . There was a Three Layer Compression Therapy Procedure by Baruch Gouty, RN. Post procedure Diagnosis Wound #1: Same as Pre-Procedure Wound #3 GAVRIEL, HOLZHAUER ([id number]) [id number] Page 11 of 14 Pre-procedure diagnosis of Wound #3 is a Lymphedema located on the Right,Medial Lower Leg . There was a Excisional Skin/Subcutaneous Tissue Debridement with a total area of 12.9 sq cm performed by Fredirick Maudlin, MD. With the following instrument(s): Curette to remove Viable and Non-Viable tissue/material. Material removed includes Subcutaneous Tissue, Slough, and Other: calcium deposits after achieving pain control using  Lidocaine 4% T opical Solution. 1 specimen was taken by a Tissue Culture and sent to the lab per  facility protocol. A time out was conducted at 11:20, prior to the start of the procedure. A Minimum amount of bleeding was controlled with Pressure. The procedure was tolerated well with a pain level of 0 throughout and a pain level of 0 following the procedure. Post Debridement Measurements: 3cm length x 4.3cm width x 0.7cm depth; 7.092cm^3 volume. Character of Wound/Ulcer Post Debridement is improved. Post procedure Diagnosis Wound #3: Same as Pre-Procedure General Notes: : scribed by Baruch Gouty, RN for Dr. [name]. Pre-procedure diagnosis of Wound #3 is a Lymphedema located on the Right,Medial Lower Leg . There was a Three Layer Compression Therapy Procedure by Baruch Gouty, RN. 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 There was a Selective/Open Wound Non-Viable Tissue Debridement with a total area of 0.3 sq cm performed by Fredirick Maudlin, MD. With the following instrument(s): Curette to remove Non-Viable tissue/material. Material removed includes [facility] after achieving pain control using Lidocaine 4% Topical Solution. No specimens were taken. A time out was conducted at 11:20, prior to the start of the procedure. A Minimum amount of bleeding was controlled with Pressure. The procedure was tolerated well with a pain level of 0 throughout and a pain level of 0 following the procedure. Post Debridement Measurements: 0.5cm length x 0.6cm width x 0.1cm depth; 0.024cm^3 volume. Character of Wound/Ulcer Post Debridement is improved. Post procedure Diagnosis Wound #6: Same as Pre-Procedure General Notes: : scribed by Baruch Gouty, RN for Dr. [name]. Plan Follow-up Appointments: Return Appointment in 1 week. - Dr. [name] Rm 1 Anesthetic: Wound #1 Left,Dorsal Foot: (In clinic) Topical Lidocaine 4% applied to wound bed Wound #3 Right,Medial Lower Leg: (In clinic)  Topical Lidocaine 4% applied to wound bed Bathing/ Shower/ Hygiene: May shower with protection but do not get wound dressing(s) wet. Edema Control - Lymphedema / SCD / Other: Lymphedema Pumps. Use Lymphedema pumps on leg(s) 2-3 times a day for 45-60 minutes. If wearing any wraps or hose, do not remove them. Continue exercising as instructed. - use 1-2 times per day over wraps Elevate legs to the level of the heart or above for 30 minutes daily and/or when sitting, a frequency of: Exercise regularly Laboratory ordered were: Anaerobic culture PCR - right medial lower leg The following medication(s) was prescribed: levofloxacin oral 750 mg tablet 1 tab p.o. daily x10 days starting 05/12/2022 WOUND #1: - Foot Wound Laterality: Dorsal, Left Peri-Wound Care: Sween Lotion (Moisturizing lotion) 1 x Per Week/ Discharge Instructions: Apply moisturizing lotion as directed Prim Dressing: KerraCel Ag Gelling Fiber Dressing, 4x5 in (silver alginate) 1 x Per Week/ ary Discharge Instructions: Apply silver alginate to wound bed as instructed Secondary Dressing: Woven Gauze Sponge, Non-Sterile 4x4 in 1 x Per Week/ Discharge Instructions: Apply over primary dressing as directed. Com pression Wrap: ThreePress (3 layer compression wrap) 1 x Per Week/ Discharge Instructions: Apply three layer compression as directed. WOUND #3: - Lower Leg Wound Laterality: Right, Medial Peri-Wound Care: Zinc Oxide Ointment 30g tube 1 x Per Week/ Discharge Instructions: Apply Zinc Oxide to periwound with each dressing change Peri-Wound Care: Sween Lotion (Moisturizing lotion) 1 x Per Week/ Discharge Instructions: Apply moisturizing lotion as directed Topical: Gentamicin 1 x Per Week/ Discharge Instructions: mix with sodium thiosulfate 1:1 Topical: sodium thiosulfate ointment 1 x Per Week/ Discharge Instructions:  apply to wound bed Secondary Dressing: ABD Pad, 8x10 1 x Per Week/ Discharge Instructions: Apply over primary  dressing as directed. Secondary Dressing: Woven Gauze Sponge, Non-Sterile 4x4 in 1 x Per Week/ Discharge Instructions: Apply over primary dressing as directed. Com pression Wrap: ThreePress (3 layer compression wrap) 1 x Per Week/ Discharge Instructions: Apply three layer compression as directed. WOUND #6: - Lower Leg Wound Laterality: Right, Anterior Peri-Wound Care: Zinc Oxide Ointment 30g tube 1 x Per Week/ Discharge Instructions: Apply Zinc Oxide to periwound with each dressing change Peri-Wound Care: Sween Lotion (Moisturizing lotion) 1 x Per Week/ Discharge Instructions: Apply moisturizing lotion as directed Topical: Gentamicin 1 x Per Week/ Discharge Instructions: mix with sodium thiosulfate 1:1 Topical: sodium thiosulfate ointment 1 x Per Week/ Discharge Instructions: apply to wound bed Secondary Dressing: ABD Pad, 8x10 1 x Per Week/ Discharge Instructions: Apply over primary dressing as directed. Secondary Dressing: Woven Gauze Sponge, Non-Sterile 4x4 in 1 x Per Week/ Discharge Instructions: Apply over primary dressing as directed. Com pression Wrap: ThreePress (3 layer compression wrap) 1 x Per Week/ Discharge Instructions: Apply three layer compression as directed. ARJUN, HARD ([id number]) [id number] Page 12 of 14 05/12/2022: The left dorsal foot wound continues to contract and fill with granulation tissue. There is slough on the wound surface. The right medial calf wounds also continue to fill with healthy tissue, but there is remaining slough and fat necrosis present. He had a significant increase in his drainage this week and it was a bright yellow-green color. He also had a new wound open over his right anterior tibial surface associated with the spike of cutaneous calcium. The leg is more red, as well, concerning for infection. I used a curette to debride slough off of the left dorsal foot wound. I also debrided slough off of the new right  anterior tibia wound. I debrided slough and nonviable subcutaneous tissue from the right medial calf wound. I then took a culture due to the clinical findings concerning for infection. I empirically prescribed levofloxacin as his culture last time grew out Morganella. Certainly, once the culture data return, I will tailor antibiotic therapy as indicated. We will continue silver alginate to the dorsal foot wound as well as the new anterior tibial wound. I am going to mix gentamicin with his compounded topical sodium thiosulfate ointment for the medial calf wounds. Continue 3 layer compression. Follow-up in 1 week. Electronic Signature(s) Signed: 05/12/2022 5:44:27 PM By: Baruch Gouty RN, BSN Signed: 05/13/2022 11:30:57 AM By: Fredirick Maudlin MD FACS Previous Signature: 05/12/2022 11:48:30 AM Version By: Fredirick Maudlin MD FACS Entered By: Baruch Gouty on 05/12/2022 17:24:29 -------------------------------------------------------------------------------- HxROS Details Patient Name: Date of Service: [name], [name] 05/12/2022 10:30 A M Medical Record Number: [id number] Patient Account Number: [id number] Date of Birth/Sex: Treating RN: 1950-09-12 (70 y.o. M) Primary Care Provider: PA [name], NO Other Clinician: Referring Provider: Treating Provider/Extender: [name] in Treatment: 13 Information Obtained From Patient Chart Constitutional Symptoms (General Health) Medical History: Past Medical History Notes: obesity Eyes Medical History: Positive for: Cataracts - right eye removed Negative for: Glaucoma; Optic Neuritis Ear/Nose/Mouth/Throat Medical History: Negative for: Chronic sinus problems/congestion; Middle ear problems Past Medical History Notes: hard of hearing Hematologic/Lymphatic Medical History: Positive for: Anemia - macrocytic; Lymphedema Respiratory Medical History: Past Medical History Notes: frozen diaphragm  right Cardiovascular Medical History: Positive for: Congestive Heart Failure; Deep Vein Thrombosis - left leg; Hypertension; Peripheral Venous Disease Past Medical History Notes: hyperlipidemia Endocrine DENNYS, GUIN ([id number]) [id number]  Page 13 of 14 Medical History: Negative for: Type I Diabetes; Type II Diabetes Genitourinary Medical History: Negative for: End Stage Renal Disease Integumentary (Skin) Medical History: Negative for: History of Burn Musculoskeletal Medical History: Past Medical History Notes: myositis, lumbar DDD, spinal stenosis, cervical facet joint syndrome Oncologic Medical History: Negative for: Received Chemotherapy; Received Radiation Psychiatric Medical History: Negative for: Anorexia/bulimia; Confinement Anxiety HBO Extended History Items Eyes: Cataracts Immunizations Pneumococcal Vaccine: Received Pneumococcal Vaccination: Yes Received Pneumococcal Vaccination On or After 60th Birthday: Yes Implantable Devices No devices added Hospitalization / Surgery History Type of Hospitalization/Surgery cervical fusion lumbar surgery coronary stent placement lasik eye surgery hydrocele excision Family and Social History Cancer: Yes - Mother; Diabetes: No; Heart Disease: Yes - Father,Mother; Hereditary Spherocytosis: No; Hypertension: Yes - Father,Siblings; Kidney Disease: No; Lung Disease: Yes - Siblings; Seizures: No; Stroke: No; Thyroid Problems: No; Tuberculosis: No; Former smoker - quit 1991; Marital Status - Married; Alcohol Use: Moderate; Drug Use: No History; Caffeine Use: Daily - coffee; Financial Concerns: No; Food, Clothing or Shelter Needs: No; Support System Lacking: No; Transportation Concerns: No Electronic Signature(s) Signed: 05/12/2022 12:35:35 PM By: Fredirick Maudlin MD FACS Entered By: Fredirick Maudlin on 05/12/2022  11:39:17 -------------------------------------------------------------------------------- SuperBill Details Patient Name: Date of Service: [name], [name] 05/12/2022 Medical Record Number: [id number] Patient Account Number: [id number] Date of Birth/Sex: Treating RN: [birth year] (71 y.o. 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 Lymphedema, not elsewhere classified Z86.718 Personal history of other venous thrombosis and embolism Facility Procedures : CPT4 Code: [id number] Description: 57017 - DEB SUBQ TISSUE 20 SQ CM/< ICD-10 Diagnosis Description B93.903 Non-pressure chronic ulcer of other part of right lower leg with fat layer expos Modifier: ed Quantity: 1 : CPT4 Code: [id number] Description: 76226 - DEBRIDE WOUND 1ST 20 SQ CM OR < ICD-10 Diagnosis Description L97.522 Non-pressure chronic ulcer of other part of left foot with fat layer exposed L97.812 Non-pressure chronic ulcer of other part of right lower leg with fat layer  expos Modifier: ed Quantity: 1 Physician Procedures : CPT4 Code Description Modifier [id number] - WC PHYS LEVEL 4 - EST PT 25 ICD-10 Diagnosis Description 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d L97.522 Non-pressure chronic ulcer of other part of left  foot with fat layer exposed 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  I89.0 Lymphedema, not elsewhere classified Quantity: 1 : [id number] - WC PHYS SUBQ TISS 20 SQ CM ICD-10 Diagnosis Description 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d Quantity: 1 : [id number] - WC PHYS DEBR WO ANESTH 20 SQ CM ICD-10 Diagnosis Description L97.522 Non-pressure chronic ulcer of other part of left foot with fat layer exposed 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d Quantity: 1 Electronic Signature(s) Signed: 05/12/2022 11:49:04 AM By: Fredirick Maudlin MD FACS Entered By: Fredirick Maudlin on 05/12/2022 11:49:04

## 2022-05-13 NOTE — Progress Notes (Signed)
[name] ([id number]) [id number] Page 1 of 11 Visit Report for 05/12/2022 Arrival Information Details Patient Name: Date of Service: [name], [name] 05/12/2022 10:30 A M Medical Record Number: [id number] Patient Account Number: [id number] Date of Birth/Sex: Treating RN: 01-31-1951 (71 y.o. M) Primary Care [name]: PA [name], [name] Other Clinician: Referring [name]: Treating [name]/Extender: [name] in Treatment: 13 Visit Information History Since Last Visit All ordered tests and consults were completed: [name] Patient Arrived: Wheel Chair Added or deleted any medications: [name] Arrival Time: 10:34 Any new allergies or adverse reactions: [name] Accompanied By: self Had a fall or experienced change in [name] Transfer Assistance: None activities of daily living that may affect Patient Requires Transmission-Based Precautions: [name] risk of falls: Patient Has Alerts: Yes Signs or symptoms of abuse/neglect since last visito [name] Patient Alerts: R ABI= N/C, TBI= .75 Hospitalized since last visit: [name] L ABI =N/C, TBI = .57 Implantable device outside of the clinic excluding [name] cellular tissue based products placed in the center since last visit: Has Dressing in Place as Prescribed: Yes Has Compression in Place as Prescribed: Yes Pain Present Now: Yes Electronic Signature(s) Signed: 05/12/2022 5:44:27 PM By: Baruch Gouty RN, BSN Entered By: Baruch Gouty on 05/12/2022 11:03:38 -------------------------------------------------------------------------------- Compression Therapy Details Patient Name: Date of Service: [name] 05/12/2022 10:30 A M Medical Record Number: [id number] Patient Account Number: [id number] Date of Birth/Sex: Treating RN: 24-Jan-1951 (71 y.o. [name] Primary Care [name]: PA [name], [name] Other Clinician: Referring [name]: Treating Nakhi Choi/Extender: [name] in Treatment:  13 Compression Therapy Performed for Wound Assessment: Wound #1 Left,Dorsal Foot Performed By: Clinician Baruch Gouty, RN Compression Type: Three Layer Post Procedure Diagnosis Same as Pre-procedure Electronic Signature(s) Signed: 05/12/2022 5:44:27 PM By: Baruch Gouty RN, BSN Entered By: Baruch Gouty on 05/12/2022 11:38:30 [name] ([id number]) [id number] Page 2 of 11 -------------------------------------------------------------------------------- Compression Therapy Details Patient Name: Date of Service: [name], [name] 05/12/2022 10:30 A M Medical Record Number: [id number] Patient Account Number: [id number] Date of Birth/Sex: Treating RN: 03/30/1951 (71 y.o. [name] Primary Care [name]: PA [name], [name] Other Clinician: Referring [name]: Treating [name]/Extender: [name] in Treatment: 13 Compression Therapy Performed for Wound Assessment: Wound #3 Right,Medial Lower Leg Performed By: Clinician Baruch Gouty, RN Compression Type: Three Layer Post Procedure Diagnosis Same as Pre-procedure Electronic Signature(s) Signed: 05/12/2022 5:44:27 PM By: Baruch Gouty RN, BSN Entered By: Baruch Gouty on 05/12/2022 11:38:30 -------------------------------------------------------------------------------- Encounter Discharge Information Details Patient Name: Date of Service: [name] 05/12/2022 10:30 A M Medical Record Number: [id number] Patient Account Number: [id number] Date of Birth/Sex: Treating RN: [birth year] (71 y.o. [name] Primary Care [name]: PA [name], [name] Other Clinician: Referring [name]: Treating [name]/Extender: [name] in Treatment: 13 Encounter Discharge Information Items Post Procedure Vitals Discharge Condition: Stable Temperature (F): 97.4 Ambulatory Status: Wheelchair Pulse (bpm): 62 Discharge Destination: Home Respiratory Rate  (breaths/min): 18 Transportation: Private Auto Blood Pressure (mmHg): 135/67 Accompanied By: self Schedule Follow-up Appointment: Yes Clinical Summary of Care: Patient Declined Electronic Signature(s) Signed: 05/12/2022 5:44:27 PM By: Baruch Gouty RN, BSN Entered By: Baruch Gouty on 05/12/2022 11:59:55 -------------------------------------------------------------------------------- Lower Extremity Assessment Details Patient Name: Date of Service: [name] 05/12/2022 10:30 A M Medical Record Number: [id number] Patient Account Number: [id number] Date of Birth/Sex: Treating RN: July 30, 1950 (71 y.o. [name] Primary Care [name]: PA [name], [name] Other Clinician: Referring [name]: Treating [name]/Extender: [name] in Treatment: 13 Edema Assessment  Left: [Left: Right] [Right: :] Assessed: [Left: [name]] [Right: [name]] Edema: [Left: Yes] [Right: Yes] Calf Left: Right: Point of Measurement: From Medial Instep 41.2 cm 41.5 cm Ankle Left: Right: Point of Measurement: From Medial Instep 24.3 cm 26 cm Vascular Assessment Pulses: Dorsalis Pedis Palpable: [Left:Yes] [Right:Yes] Electronic Signature(s) Signed: 05/12/2022 5:44:27 PM By: Baruch Gouty RN, BSN Entered By: Baruch Gouty on 05/12/2022 11:06:27 -------------------------------------------------------------------------------- Multi Wound Chart Details Patient Name: Date of Service: [name] 05/12/2022 10:30 A M Medical Record Number: [id number] Patient Account Number: [id number] Date of Birth/Sex: Treating RN: 1951-02-09 (71 y.o. M) Primary Care Shakema Surita: PA [name] Other Clinician: Referring [name]: Treating [name]/Extender: [name], [name] in Treatment: 13 Vital Signs Height(in): 71 Pulse(bpm): 75 Weight(lbs): 40 Blood Pressure(mmHg): 135/67 Body Mass Index(BMI): 37.2 Temperature(F): 97.4 Respiratory Rate(breaths/min):  20 [1:Photos:] Left, Dorsal Foot Right, Medial Lower Leg Right, Anterior Lower Leg Wound Location: Gradually Appeared Gradually Appeared Gradually Appeared Wounding Event: Lymphedema Lymphedema Calcinosis Primary Etiology: Cataracts, Anemia, Lymphedema, Cataracts, Anemia, Lymphedema, Cataracts, Anemia, Lymphedema, Comorbid History: Congestive Heart Failure, Deep Vein Congestive Heart Failure, Deep Vein Congestive Heart Failure, Deep Vein Thrombosis, Hypertension, Peripheral Thrombosis, Hypertension, Peripheral Thrombosis, Hypertension, Peripheral Venous Disease Venous Disease Venous Disease 12/07/2021 12/07/2021 05/12/2022 Date Acquired: 13 13 0 [name] of Treatment: Open Open Open Wound Status: [name] [name] Wound Recurrence: 2x1.5x0.2 3x4.3x0.7 0.5x0.6x0.1 Measurements L x W x D (cm) 2.356 10.132 0.236 A (cm) : rea 0.471 7.092 0.024 Volume (cm) : 37.90% -89.70% N/A % Reduction in Area: 69.00% -232.00% N/A % Reduction in Volume: Full Thickness Without Exposed Full Thickness With Exposed Support Full Thickness Without Exposed Classification: Support Structures Structures Support Structures JERRETT, BALDINGER ([id number]) [id number] Page 4 of 11 Medium Large Medium Exudate Amount: Serosanguineous Purulent Serosanguineous Exudate Type: red, brown yellow, brown, green red, brown Exudate Color: Distinct, outline attached Distinct, outline attached Distinct, outline attached Wound Margin: Medium (34-66%) Medium (34-66%) Large (67-100%) Granulation Amount: Red, Pink Red, Friable Red, Friable Granulation Quality: Medium (34-66%) Medium (34-66%) Small (1-33%) Necrotic Amount: Fat Layer (Subcutaneous Tissue): Yes Fat Layer (Subcutaneous Tissue): Yes Fat Layer (Subcutaneous Tissue): Yes Exposed Structures: Fascia: [name] Fascia: [name] Fascia: [name] Tendon: [name] Tendon: [name] Tendon: [name] Muscle: [name] Muscle: [name] Muscle: [name] Joint: [name] Joint: [name] Joint: [name] Bone: [name] Bone:  [name] Bone: [name] Small (1-33%) Small (1-33%) None Epithelialization: Debridement - Selective/Open Wound Debridement - Excisional Debridement - Selective/Open Wound Debridement: Pre-procedure Verification/Time Out 11:20 11:20 11:20 Taken: Lidocaine 4% Topical Solution Lidocaine 4% Topical Solution Lidocaine 4% Topical Solution Pain Control: Slough Other, Subcutaneous, [employer] Tissue Debrided: Non-Viable Tissue Skin/Subcutaneous Tissue Non-Viable Tissue Level: 3 12.9 0.3 Debridement A (sq cm): rea Curette Curette Curette Instrument: Minimum Minimum Minimum Bleeding: Pressure Pressure Pressure Hemostasis A chieved: 0 0 0 Procedural Pain: 0 0 0 Post Procedural Pain: Procedure was tolerated well Procedure was tolerated well Procedure was tolerated well Debridement Treatment Response: 2x1.5x0.2 3x4.3x0.7 0.5x0.6x0.1 Post Debridement Measurements L x W x D (cm) 0.471 7.092 0.024 Post Debridement Volume: (cm) [name] Abnormalities Noted Excoriation: Yes Periwound Skin Texture: [name] Abnormalities Noted [name] Abnormalities Noted [name] Abnormalities Noted Periwound Skin Moisture: Erythema: Yes Erythema: Yes Hemosiderin Staining: Yes Periwound Skin Color: Hemosiderin Staining: Yes N/A Red Streaks N/A Erythema Location: [name] Change N/A N/A Erythema Change: [name] Abnormality Hot [name] Abnormality Temperature: Yes Yes Yes Tenderness on Palpation: Debridement Debridement Debridement Procedures Performed: Treatment Notes Electronic Signature(s) Signed: 05/12/2022 11:37:02 AM By: Fredirick Maudlin MD FACS Entered By: Fredirick Maudlin on 05/12/2022 11:37:02 --------------------------------------------------------------------------------  Multi-Disciplinary Care Plan Details Patient Name: Date of Service: [name], [name] 05/12/2022 10:30 A M Medical Record Number: [id number] Patient Account Number: [id number] Date of Birth/Sex: Treating RN: 04/11/1951 (71 y.o. [name] Primary Care  Wiatt Mahabir: PA [name], [name] Other Clinician: Referring Aleeah Greeno: Treating Marabelle Cushman/Extender: [name] in Treatment: [place] reviewed with physician Active Inactive Venous Leg Ulcer Nursing Diagnoses: Knowledge deficit related to disease process and management Potential for venous Insuffiency (use before diagnosis confirmed) Goals: Patient will maintain optimal edema control Date Initiated: 02/17/2022 Target Resolution Date: 06/02/2022 Goal Status: Active [name], [name] ([id number]) [id number] Page 5 of 11 Interventions: Assess peripheral edema status every visit. Compression as ordered Provide education on venous insufficiency Treatment Activities: Non-invasive vascular studies : 02/17/2022 Therapeutic compression applied : 02/17/2022 Notes: Wound/Skin Impairment Nursing Diagnoses: Impaired tissue integrity Knowledge deficit related to ulceration/compromised skin integrity Goals: Patient/caregiver will verbalize understanding of skin care regimen Date Initiated: 02/17/2022 Target Resolution Date: 06/02/2022 Goal Status: Active Ulcer/skin breakdown will have a volume reduction of 30% by week 4 Date Initiated: 02/17/2022 Date Inactivated: 03/10/2022 Target Resolution Date: 03/10/2022 Unmet Reason: infection being treated, Goal Status: Unmet waiting on derm appte Ulcer/skin breakdown will have a volume reduction of 50% by week 8 Date Initiated: 03/10/2022 Date Inactivated: 04/14/2022 Target Resolution Date: 04/07/2022 Goal Status: Unmet Unmet Reason: calcinosis Ulcer/skin breakdown will have a volume reduction of 80% by week 12 Date Initiated: 04/14/2022 Date Inactivated: 05/05/2022 Target Resolution Date: 05/05/2022 Unmet Reason: infection, chronic Goal Status: Unmet condition Interventions: Assess patient/caregiver ability to obtain necessary supplies Assess patient/caregiver ability to perform ulcer/skin  care regimen upon admission and as needed Assess ulceration(s) every visit Provide education on ulcer and skin care Treatment Activities: Skin care regimen initiated : 02/17/2022 Topical wound management initiated : 02/17/2022 Notes: Electronic Signature(s) Signed: 05/12/2022 5:44:27 PM By: Baruch Gouty RN, BSN Entered By: Baruch Gouty on 05/12/2022 11:17:54 -------------------------------------------------------------------------------- Pain Assessment Details Patient Name: Date of Service: [name] 05/12/2022 10:30 A M Medical Record Number: [id number] Patient Account Number: [id number] Date of Birth/Sex: Treating RN: 04-Dec-1950 (71 y.o. [name] Primary Care Dim Meisinger: PA [name], [name] Other Clinician: Referring Cono Gebhard: Treating Gordon Carlson/Extender: [name] in Treatment: 13 Active Problems Location of Pain Severity and Description of Pain Patient Has Paino Yes Site Locations Pain LocationKOICHI, [name] ([id number]) [id number] Page 6 of 11 Pain Location: Pain in Ulcers With Dressing Change: Yes Duration of the Pain. Constant / Intermittento Intermittent Rate the pain. Current Pain Level: 3 Worst Pain Level: 5 Least Pain Level: 0 Character of Pain Describe the Pain: Aching, Throbbing Pain Management and Medication Current Pain Management: Medication: Yes Is the Current Pain Management Adequate: Adequate How does your wound impact your activities of daily livingo Sleep: [name] Bathing: [name] Appetite: [name] Relationship With Others: [name] Bladder Continence: [name] Emotions: [name] Bowel Continence: [name] Work: [name] Toileting: [name] Drive: [name] Dressing: [name] Hobbies: [name] Electronic Signature(s) Signed: 05/12/2022 5:44:27 PM By: Baruch Gouty RN, BSN Entered By: Baruch Gouty on 05/12/2022 11:05:17 -------------------------------------------------------------------------------- Patient/Caregiver Education Details Patient  Name: Date of Service: [name] 10/18/2023andnbsp10:30 A M Medical Record Number: [id number] Patient Account Number: [id number] Date of Birth/Gender: Treating RN: Jun 18, 1951 (71 y.o. [name] Primary Care Physician: PA [name], [name] Other Clinician: Referring Physician: Treating Physician/Extender: [name] in Treatment: 13 Education Assessment Education Provided To: Patient Education Topics Provided Venous: Methods: Explain/Verbal Responses: Reinforcements needed, State content correctly Wound/Skin Impairment: Methods: Explain/Verbal Responses:  Reinforcements needed, State content correctly Electronic Signature(s) Signed: 05/12/2022 5:44:27 PM By: Baruch Gouty RN, BSN [place], [name] ([id number]) [id number] Page 7 of 11 Entered By: Baruch Gouty on 05/12/2022 11:18:13 -------------------------------------------------------------------------------- Wound Assessment Details Patient Name: Date of Service: [name], [name] 05/12/2022 10:30 A M Medical Record Number: [id number] Patient Account Number: [id number] Date of Birth/Sex: Treating RN: 04-Jun-1951 (71 y.o. M) Primary Care Rorey Hodges: PA [name] Other Clinician: Referring Ola Raap: Treating Renly Guedes/Extender: [name] in Treatment: 13 Wound Status Wound Number: 1 Primary Lymphedema Etiology: Wound Location: Left, Dorsal Foot Wound Open Wounding Event: Gradually Appeared Status: Date Acquired: 12/07/2021 Comorbid Cataracts, Anemia, Lymphedema, Congestive Heart Failure, Deep [name] Of Treatment: 13 History: Vein Thrombosis, Hypertension, Peripheral Venous Disease Clustered Wound: [name] Photos Wound Measurements Length: (cm) 2 Width: (cm) 1.5 Depth: (cm) 0.2 Area: (cm) 2.356 Volume: (cm) 0.471 % Reduction in Area: 37.9% % Reduction in Volume: 69% Epithelialization: Small (1-33%) Tunneling: [name] Undermining: [name] Wound  Description Classification: Full Thickness Without Exposed Suppor Wound Margin: Distinct, outline attached Exudate Amount: Medium Exudate Type: Serosanguineous Exudate Color: red, brown t Structures Foul Odor After Cleansing: [name] Slough/Fibrino Yes Wound Bed Granulation Amount: Medium (34-66%) Exposed Structure Granulation Quality: Red, Pink Fascia Exposed: [name] Necrotic Amount: Medium (34-66%) Fat Layer (Subcutaneous Tissue) Exposed: Yes Necrotic Quality: Adherent Slough Tendon Exposed: [name] Muscle Exposed: [name] Joint Exposed: [name] Bone Exposed: [name] Periwound Skin Texture Texture Color [name] Abnormalities Noted: Yes [name] Abnormalities Noted: [name] Erythema: Yes Moisture Erythema Change: [name] Change [name] Abnormalities Noted: Yes Temperature / Pain Temperature: [name] Abnormality Tenderness on PalpationKAINE, [name] ([id number]) [id number] Page 8 of 11 Treatment Notes Wound #1 (Foot) Wound Laterality: Dorsal, Left Cleanser Peri-Wound Care Sween Lotion (Moisturizing lotion) Discharge Instruction: Apply moisturizing lotion as directed Topical Primary Dressing KerraCel Ag Gelling Fiber Dressing, 4x5 in (silver alginate) Discharge Instruction: Apply silver alginate to wound bed as instructed Secondary Dressing Woven Gauze Sponge, Non-Sterile 4x4 in Discharge Instruction: Apply over primary dressing as directed. Secured With Compression Wrap ThreePress (3 layer compression wrap) Discharge Instruction: Apply three layer compression as directed. Compression Stockings Add-Ons Electronic Signature(s) Signed: 05/12/2022 5:44:27 PM By: Baruch Gouty RN, BSN Entered By: Baruch Gouty on 05/12/2022 11:13:43 -------------------------------------------------------------------------------- Wound Assessment Details Patient Name: Date of Service: [name] 05/12/2022 10:30 A M Medical Record Number: [id number] Patient Account Number: [id number] Date of Birth/Sex:  Treating RN: 1951-02-28 (71 y.o. M) Primary Care Clarisa Danser: PA [name] Other Clinician: Referring Delma Villalva: Treating Zayon Trulson/Extender: [name] in Treatment: 13 Wound Status Wound Number: 3 Primary Lymphedema Etiology: Wound Location: Right, Medial Lower Leg Wound Open Wounding Event: Gradually Appeared Status: Date Acquired: 12/07/2021 Comorbid Cataracts, Anemia, Lymphedema, Congestive Heart Failure, Deep [name] Of Treatment: 13 History: Vein Thrombosis, Hypertension, Peripheral Venous Disease Clustered Wound: [name] Photos Wound Measurements Length: (cm) 3 Width: (cm) 4.3 Hoobler, Elis ([id number]) Depth: (cm) 0.7 Area: (cm) 10.132 Volume: (cm) 7.092 % Reduction in Area: -89.7% % Reduction in Volume: -232% [id number] Page 9 of 11 Epithelialization: Small (1-33%) Tunneling: [name] Undermining: [name] Wound Description Classification: Full Thickness With Exposed Suppo Wound Margin: Distinct, outline attached Exudate Amount: Large Exudate Type: Purulent Exudate Color: yellow, brown, green rt Structures Foul Odor After Cleansing: [name] Slough/Fibrino Yes Wound Bed Granulation Amount: Medium (34-66%) Exposed Structure Granulation Quality: Red, Friable Fascia Exposed: [name] Necrotic Amount: Medium (34-66%) Fat Layer (Subcutaneous Tissue) Exposed: Yes Necrotic Quality: Adherent Slough Tendon Exposed: [name] Muscle Exposed: [name] Joint Exposed: [name] Bone Exposed: [name] Periwound Skin Texture Texture  Color [name] Abnormalities Noted: Yes [name] Abnormalities Noted: [name] Erythema: Yes Moisture Erythema Location: Red Streaks [name] Abnormalities Noted: Yes Hemosiderin Staining: Yes Temperature / Pain Temperature: Hot Tenderness on Palpation: Yes Treatment Notes Wound #3 (Lower Leg) Wound Laterality: Right, Medial Cleanser Peri-Wound Care Zinc Oxide Ointment 30g tube Discharge Instruction: Apply Zinc Oxide to periwound with each dressing  change Sween Lotion (Moisturizing lotion) Discharge Instruction: Apply moisturizing lotion as directed Topical Gentamicin Discharge Instruction: mix with sodium thiosulfate 1:1 sodium thiosulfate ointment Discharge Instruction: apply to wound bed Primary Dressing Secondary Dressing ABD Pad, 8x10 Discharge Instruction: Apply over primary dressing as directed. Woven Gauze Sponge, Non-Sterile 4x4 in Discharge Instruction: Apply over primary dressing as directed. Secured With Compression Wrap ThreePress (3 layer compression wrap) Discharge Instruction: Apply three layer compression as directed. Compression Stockings Add-Ons Electronic Signature(s) Signed: 05/12/2022 5:44:27 PM By: Baruch Gouty RN, BSN Entered By: Baruch Gouty on 05/12/2022 11:14:34 [name] ([id number]) [id number] Page 10 of 11 -------------------------------------------------------------------------------- Wound Assessment Details Patient Name: Date of Service: [name], [name] 05/12/2022 10:30 A M Medical Record Number: [id number] Patient Account Number: [id number] Date of Birth/Sex: Treating RN: 22-Mar-1951 (71 y.o. [name] Primary Care Jakori Burkett: PA [name], [name] Other Clinician: Referring Bomani Oommen: Treating Gabrielly Mccrystal/Extender: [name] in Treatment: 13 Wound Status Wound Number: 6 Primary Calcinosis Etiology: Wound Location: Right, Anterior Lower Leg Wound Open Wounding Event: Gradually Appeared Status: Date Acquired: 05/12/2022 Comorbid Cataracts, Anemia, Lymphedema, Congestive Heart Failure, Deep [name] Of Treatment: 0 History: Vein Thrombosis, Hypertension, Peripheral Venous Disease Clustered Wound: [name] Photos Wound Measurements Length: (cm) 0.5 Width: (cm) 0.6 Depth: (cm) 0.1 Area: (cm) 0.236 Volume: (cm) 0.024 % Reduction in Area: % Reduction in Volume: Epithelialization: None Tunneling: [name] Undermining: [name] Wound  Description Classification: Full Thickness Without Exposed Suppor Wound Margin: Distinct, outline attached Exudate Amount: Medium Exudate Type: Serosanguineous Exudate Color: red, brown t Structures Foul Odor After Cleansing: [name] Slough/Fibrino Yes Wound Bed Granulation Amount: Large (67-100%) Exposed Structure Granulation Quality: Red, Friable Fascia Exposed: [name] Necrotic Amount: Small (1-33%) Fat Layer (Subcutaneous Tissue) Exposed: Yes Necrotic Quality: Adherent Slough Tendon Exposed: [name] Muscle Exposed: [name] Joint Exposed: [name] Bone Exposed: [name] Periwound Skin Texture Texture Color [name] Abnormalities Noted: [name] Abnormalities Noted: [name] Hemosiderin Staining: Yes Moisture [name] Abnormalities Noted: Yes Temperature / Pain Temperature: [name] Abnormality Tenderness on Palpation: Yes Treatment Notes Wound #6 (Lower Leg) Wound Laterality: Right, Anterior MERIT, MAYBEE ([id number]) [id number] Page 11 of 11 Peri-Wound Care Zinc Oxide Ointment 30g tube Discharge Instruction: Apply Zinc Oxide to periwound with each dressing change Sween Lotion (Moisturizing lotion) Discharge Instruction: Apply moisturizing lotion as directed Topical Gentamicin Discharge Instruction: mix with sodium thiosulfate 1:1 sodium thiosulfate ointment Discharge Instruction: apply to wound bed Primary Dressing Secondary Dressing ABD Pad, 8x10 Discharge Instruction: Apply over primary dressing as directed. Woven Gauze Sponge, Non-Sterile 4x4 in Discharge Instruction: Apply over primary dressing as directed. Secured With Compression Wrap ThreePress (3 layer compression wrap) Discharge Instruction: Apply three layer compression as directed. Compression Stockings Add-Ons Electronic Signature(s) Signed: 05/12/2022 5:44:27 PM By: Baruch Gouty RN, BSN Entered By: Baruch Gouty on 05/12/2022  11:15:15 -------------------------------------------------------------------------------- Vitals Details Patient Name: Date of Service: [name] 05/12/2022 10:30 A M Medical Record Number: [id number] Patient Account Number: [id number] Date of Birth/Sex: Treating RN: 04-29-1951 (71 y.o. M) Primary Care Jana Swartzlander: PA [name], [name] Other Clinician: Referring Sandip Power: Treating Hannie Shoe/Extender: [name] in Treatment: 13 Vital Signs Time Taken: 10:33 Temperature (F): 97.4 Height (in):  71 Pulse (bpm): 62 Source: Stated Respiratory Rate (breaths/min): 20 Weight (lbs): 267 Blood Pressure (mmHg): 135/67 Source: Stated Reference Range: 80 - 120 mg / dl Body Mass Index (BMI): 37.2 Electronic Signature(s) Signed: 05/12/2022 5:44:27 PM By: Baruch Gouty RN, BSN Entered By: Baruch Gouty on 05/12/2022 11:04:08

## 2022-05-19 ENCOUNTER — Encounter (HOSPITAL_BASED_OUTPATIENT_CLINIC_OR_DEPARTMENT_OTHER): Payer: Medicare Other | Admitting: General Surgery

## 2022-05-19 DIAGNOSIS — I110 Hypertensive heart disease with heart failure: Secondary | ICD-10-CM | POA: Diagnosis not present

## 2022-05-19 DIAGNOSIS — L97812 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d: Secondary | ICD-10-CM | POA: Diagnosis not present

## 2022-05-19 DIAGNOSIS — I872 Venous insufficiency (chronic) (peripheral): Secondary | ICD-10-CM | POA: Diagnosis not present

## 2022-05-19 DIAGNOSIS — I509 Heart failure, unspecified: Secondary | ICD-10-CM | POA: Diagnosis not present

## 2022-05-19 DIAGNOSIS — L97522 Non-pressure chronic ulcer of other part of left foot with fat layer exposed: Secondary | ICD-10-CM | POA: Diagnosis not present

## 2022-05-19 DIAGNOSIS — I890 Lymphedema, not elsewhere classified: Secondary | ICD-10-CM | POA: Diagnosis not present

## 2022-05-19 DIAGNOSIS — L942 Calcinosis cutis: Secondary | ICD-10-CM | POA: Diagnosis not present

## 2022-05-20 NOTE — Progress Notes (Signed)
[name] ([id number]) [id number] Page 1 of 14 Visit Report for 05/19/2022 Chief Complaint Document Details Patient Name: Date of Service: [name], [name] 05/19/2022 12:30 PM Medical Record Number: [id number] Patient Account Number: [id number] Date of Birth/Sex: Treating RN: 1950/12/05 (71 y.o. M) Primary Care Provider: PA [name], NO Other Clinician: Referring Provider: Treating Provider/Extender: [name] in Treatment: 14 Information Obtained from: Patient Chief Complaint Patient seen for complaints of Non-Healing Wounds. Electronic Signature(s) Signed: 05/19/2022 1:43:30 PM By: Fredirick Maudlin MD FACS Entered By: Fredirick Maudlin on 05/19/2022 13:43:30 -------------------------------------------------------------------------------- Debridement Details Patient Name: Date of Service: [name], [name] 05/19/2022 12:30 PM Medical Record Number: [id number] Patient Account Number: [id number] Date of Birth/Sex: Treating RN: 1950/08/22 (71 y.o. [name] Primary Care Provider: PA [name], [place] Other Clinician: Referring Provider: Treating Provider/Extender: [name] in Treatment: 14 Debridement Performed for Assessment: Wound #6 Right,Anterior Lower Leg Performed By: Physician Fredirick Maudlin, MD Debridement Type: Debridement Level of Consciousness (Pre-procedure): Awake and Alert Pre-procedure Verification/Time Out Yes - 13:20 Taken: Start Time: 13:25 Pain Control: Lidocaine 5% topical ointment T Area Debrided (L x W): otal 0.5 (cm) x 0.4 (cm) = 0.2 (cm) Tissue and other material debrided: Non-Viable, Slough, Slough Level: Non-Viable Tissue Debridement Description: Selective/Open Wound Instrument: Curette Bleeding: Minimum Hemostasis Achieved: Pressure Procedural Pain: 0 Post Procedural Pain: 0 Response to Treatment: Procedure was tolerated well Level of Consciousness (Post- Awake and  Alert procedure): Post Debridement Measurements of Total Wound Length: (cm) 0.5 Width: (cm) 0.4 Depth: (cm) 0.1 Volume: (cm) 0.016 Character of Wound/Ulcer Post Debridement: Improved Post Procedure Diagnosis Same as [name], [name] ([id number]) [id number] Page 2 of 14 Notes scribed by Baruch Gouty, RN for Dr. [name] Electronic Signature(s) Signed: 05/19/2022 3:07:17 PM By: Fredirick Maudlin MD FACS Signed: 05/19/2022 6:06:53 PM By: Baruch Gouty RN, BSN Entered By: Baruch Gouty on 05/19/2022 13:29:54 -------------------------------------------------------------------------------- Debridement Details Patient Name: Date of Service: [name] 05/19/2022 12:30 PM Medical Record Number: [id number] Patient Account Number: [id number] Date of Birth/Sex: Treating RN: 04-Dec-1950 (71 y.o. [name] Primary Care Provider: PA [name], [place] Other Clinician: Referring Provider: Treating Provider/Extender: [name] in Treatment: 14 Debridement Performed for Assessment: Wound #1 Left,Dorsal Foot Performed By: Physician Fredirick Maudlin, MD Debridement Type: Debridement Level of Consciousness (Pre-procedure): Awake and Alert Pre-procedure Verification/Time Out Yes - 13:20 Taken: Start Time: 13:25 Pain Control: Lidocaine 5% topical ointment T Area Debrided (L x W): otal 1.5 (cm) x 1.4 (cm) = 2.1 (cm) Tissue and other material debrided: Viable, Non-Viable, Slough, Subcutaneous, Slough Level: Skin/Subcutaneous Tissue Debridement Description: Excisional Instrument: Curette Bleeding: Minimum Hemostasis Achieved: Pressure Procedural Pain: 0 Post Procedural Pain: 0 Response to Treatment: Procedure was tolerated well Level of Consciousness (Post- Awake and Alert procedure): Post Debridement Measurements of Total Wound Length: (cm) 1.5 Width: (cm) 1.4 Depth: (cm) 0.1 Volume: (cm) 0.165 Character of  Wound/Ulcer Post Debridement: Improved Post Procedure Diagnosis Same as Pre-procedure Notes scribed by Baruch Gouty, RN for Dr. [name] Electronic Signature(s) Signed: 05/19/2022 3:07:17 PM By: Fredirick Maudlin MD FACS Signed: 05/19/2022 6:06:53 PM By: Baruch Gouty RN, BSN Entered By: Baruch Gouty on 05/19/2022 13:30:25 Debridement Details -------------------------------------------------------------------------------- [name] ([id number]) [id number] Page 3 of 14 Patient Name: Date of Service: [name], [name] 05/19/2022 12:30 PM Medical Record Number: [id number] Patient Account Number: [id number] Date of Birth/Sex: Treating RN: [birth year] (71 y.o. [name] Primary Care Provider: PA [name], [place] Other Clinician: Referring Provider: Treating Provider/Extender: Valeria Batman  Weeks in Treatment: 14 Debridement Performed for Assessment: Wound #3 Right,Medial Lower Leg Performed By: Physician Fredirick Maudlin, MD Debridement Type: Debridement Level of Consciousness (Pre-procedure): Awake and Alert Pre-procedure Verification/Time Out Yes - 13:20 Taken: Start Time: 13:25 Pain Control: Lidocaine 5% topical ointment T Area Debrided (L x W): otal 3.3 (cm) x 3.8 (cm) = 12.54 (cm) Tissue and other material debrided: Viable, Non-Viable, Slough, Subcutaneous, Slough, Other: calcium deposits Level: Skin/Subcutaneous Tissue Debridement Description: Excisional Instrument: Curette Bleeding: Minimum Hemostasis Achieved: Pressure Procedural Pain: 0 Post Procedural Pain: 0 Response to Treatment: Procedure was tolerated well Level of Consciousness (Post- Awake and Alert procedure): Post Debridement Measurements of Total Wound Length: (cm) 3.3 Width: (cm) 3.8 Depth: (cm) 0.7 Volume: (cm) 6.894 Character of Wound/Ulcer Post Debridement: Improved Post Procedure Diagnosis Same as Pre-procedure Notes scribed by Baruch Gouty, RN  for Dr. [name] Electronic Signature(s) Signed: 05/19/2022 3:07:17 PM By: Fredirick Maudlin MD FACS Signed: 05/19/2022 6:06:53 PM By: Baruch Gouty RN, BSN Entered By: Baruch Gouty on 05/19/2022 13:30:40 -------------------------------------------------------------------------------- HPI Details Patient Name: Date of Service: [name], [name] 05/19/2022 12:30 PM Medical Record Number: [id number] Patient Account Number: [id number] Date of Birth/Sex: Treating RN: 1950/10/02 (71 y.o. M) Primary Care Provider: PA [name], NO Other Clinician: Referring Provider: Treating Provider/Extender: [name] in Treatment: 14 History of Present Illness HPI Description: ADMISSION 02/09/2022 This is a 72 year old man who recently moved to [place] from [place]. He has a history of of coronary artery disease and prior left popliteal vein DVT . He is not diabetic and does not smoke. 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 He has had multiple spinal surgeries and has limited mobility. He primarily uses a motorized wheelchair. He has had lymphedema for many years and does have lymphedema pumps although he says he does not use them frequently. He has wounds on his bilateral lower extremities. He was receiving care in [place] for these prior to his move. They were apparently packing his wounds with Betadine soaked gauze. He has worn compression wraps in the past but not recently. He did say that they helped tremendously. In clinic today, his right ABI is noncompressible but has good signals; the left ABI was 1.17. No formal vascular studies have been performed. On his left dorsal foot, there is a circular lesion that exposes the fat layer. There is fairly heavy slough accumulation within the wound, but no significant odor. On his right medial calf, he has multiple pinholes that have slough buildup in them and probe for about 2 to 4 mm in depth. They are draining clear  fluid. On his right lateral midfoot, he has ulceration consistent with venous stasis. It is geographic and has slough accumulation. He has changes in his lower extremities KATIE, MOCH ([id number]) [id number] Page 4 of 14 consistent with stasis dermatitis, but no hemosiderin deposition or thickening of the skin. 02/17/2022: All of the wounds are little bit larger today and have more slough accumulation. Today, the medial calf openings are larger and probe to something that feels calcified. There is odor coming from his wounds. His vascular studies are scheduled for August 9 and 10. 02-24-2022 upon evaluation today patient presents for follow-up concerning his ongoing issues here with his lower extremities. With that being said he does have significant problems with what appears to be cellulitis unfortunately the PCR culture that was sent last week got crushed by UPS in transit. With that being said we are going to reobtain a  culture today although I am actually to do it on the right medial leg as this seems to be the most inflamed area today where there is some questionable drainage as well. I am going to remove some of the calcium deposits which are loosening obtain a deeper culture from this area. Fortunately I do not see any evidence of active infection systemically which is good news but locally I think we are having a bigger issue here. He does have his appointment next Thursday with vascular. Patient has also been referred to [facility] for further evaluation and treatment of the calcinosis for possible sulfate injections. 03/04/2022: The culture that was performed last week grew out multiple species including Klebsiella, Morganella, and Pseudomonas aeruginosa. He had been prescribed Bactrim but this was not adequate to cover all of the species and levofloxacin was recommended. He completed the Bactrim but did not pick up the levofloxacin and begin taking it until  yesterday. 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 He had arterial studies performed today. He does have evidence of peripheral arterial disease. Results copied here: +-------+-----------+-----------+------------+------------+ ABI/TBIT oday's ABIT oday's TBIPrevious ABIPrevious TBI +-------+-----------+-----------+------------+------------+ Right Jansen 0.75    +-------+-----------+-----------+------------+------------+ Left  0.57    +-------+-----------+-----------+------------+------------+ Arterial wall calcification precludes accurate ankle pressures and ABIs. Summary: Right: Resting right ankle-brachial index indicates noncompressible right lower extremity arteries. The right toe-brachial index is normal. Left: Resting left ankle-brachial index indicates noncompressible left lower extremity arteries. The left toe-brachial index is abnormal. He continues to have further tissue breakdown at the right medial calf and there is ample slough accumulation along with necrotic subcutaneous tissue. The left dorsal foot wound has built up slough, as well. The right medial foot wound is nearly closed with just a light layer of eschar over the surface. The right dorsal lateral foot wound has also accumulated a fair amount of slough and remains quite tender. 03/10/2022: He has been taking the prescribed levofloxacin and has about 3 days left of this. The erythema and induration on his right leg has improved. He continues to experience further breakdown of the right medial calf wound. There is extensive slough and debris accumulation there. There is heavy slough on his left dorsal foot wound, but no odor or purulent drainage. The right medial foot wound appears to be closed. The right dorsal lateral foot wound also has a  fair amount of slough but it is less tender than at our last visit. He still does not have an appointment with dermatology. 03/22/2022: The right anterior tibial wound has closed. The right lateral foot wound is nearly closed with just a little bit of slough. The left dorsal foot wound is smaller and continues to have some slough buildup but less so than at prior visits. There is good granulation tissue underlying the slough. Unfortunately the right medial calf wound continues to deteriorate. It has a foul odor today and a lot of necrotic tissue, the surrounding skin is also erythematous and moist. 03/30/2022: The right lateral foot wound continues to contract and just has a little bit of slough and eschar present. The left dorsal foot wound is also smaller with slough overlying good granulation tissue. The right medial calf wound actually looks quite a bit better today; there is no odor and there is much less necrotic tissue although some remains present. We have been using topical gentamicin  and he has been taking oral Augmentin. 04/07/2022: The patient saw dermatology at [facility] last week. Unfortunately, it appears that they did not read any of the documentation that was provided. 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 Overall, it was a highly unsatisfactory consultation. In the meantime, however, we were able to identify compounding pharmacy who could create a topical sodium thiosulfate ointment. The patient has that with him today. Both of the right foot wounds are now closed. The left dorsal foot wound has contracted but still has a layer of slough on the surface. The medial calf wounds on the right all look a little bit  better. They still have heavy slough along with the chunks of calcium. Everything is stained purple. 04/14/2022: The wound on his dorsal left foot is a little bit smaller again today. There is still slough accumulation on the surface. The medial calf wounds have quite a bit less slough accumulation today. His right leg, however, is warm and erythematous, concerning for cellulitis. 04/14/2022: He completed his course of Augmentin yesterday. The medial calf wound sites are much cleaner today and shallower. There is less fragmented calcium in the wounds. He has a lot of lymphedema fluid-like drainage from these wounds, but no purulent discharge or malodor. The wound on his dorsal left foot has also contracted and is shallower. There is a layer of slough over healthy granulation tissue. 05/05/2022: The left dorsal foot wound is smaller today with less slough and more granulation tissue. The right medial calf wounds are shallower, but have extensive slough and some fat necrosis. The drainage has diminished considerably, however. 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 05/12/2022: The left dorsal foot wound continues to contract and fill with granulation tissue. There is slough on the wound surface. The right medial calf wounds also continue to fill with healthy tissue, but there is remaining slough and fat necrosis present. He had a significant increase in his drainage this week and it was a bright yellow-green color. He also had a new wound open over his right anterior tibial surface associated with the spike of cutaneous calcium. The leg is more red, as well, concerning for infection. 05/19/2022: His culture returned positive for Pseudomonas. He is currently taking a course of oral levofloxacin. We have also been using topical gentamicin mixed in with his sodium thiosulfate. All of the  wounds look better this week. 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 There is slough and nonviable subcutaneous tissue still present. The new anterior tibial wound just has a light layer of slough. The dorsal foot wound also has some slough present and appears a little dry today. Electronic Signature(s) Signed: 05/19/2022 1:44:32 PM By: Fredirick Maudlin MD FACS Entered By: Fredirick Maudlin on 05/19/2022 13:44:32 [name] ([id number]) [id number] Page 5 of 14 -------------------------------------------------------------------------------- Physical Exam Details Patient Name: Date of Service: [name], [name] 05/19/2022 12:30 PM Medical Record Number: [id number] Patient Account Number: [id number] Date of Birth/Sex: Treating RN: July 10, 1951 (71 y.o. M) Primary Care Provider: PA [name], NO Other Clinician: Referring Provider: Treating Provider/Extender: [name], [name] in Treatment: 14 Constitutional . . . Marland Kitchen  No acute distress.Marland Kitchen Respiratory Normal work of breathing on room air.. Notes 05/19/2022: All of the wounds look better this week. 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 There is slough and nonviable subcutaneous tissue still present. The new anterior tibial wound just has a light layer of slough. The dorsal foot wound also has some slough present and appears a little dry today. Electronic Signature(s) Signed: 05/19/2022 1:45:22 PM By: Fredirick Maudlin MD FACS Entered By: Fredirick Maudlin on 05/19/2022 13:45:22 -------------------------------------------------------------------------------- Physician Orders Details Patient Name: Date of Service: [name], [name] 05/19/2022 12:30 PM Medical Record Number: [id number] Patient Account Number: [id number] Date of Birth/Sex: Treating RN: Dec 03, 1950 (71 y.o. 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 - Dr. [name] Rm 1 Return A Anesthetic Wound #1 Left,Dorsal Foot (In clinic) Topical Lidocaine 5% applied to wound bed (In clinic) Topical Lidocaine 4% applied to wound bed Wound #3 Right,Medial Lower Leg (In clinic) Topical Lidocaine 5% applied to wound bed (In clinic) Topical Lidocaine 4% applied to wound bed ADRIENNE, DELAY ([id number]) [id number] Page 6 of 14 Wound #6 Right,Anterior Lower Leg (In clinic) Topical Lidocaine 5% applied to wound bed (In clinic) Topical Lidocaine 4% applied to wound bed Bathing/ Shower/ Hygiene May shower with protection but do not get wound dressing(s) wet. Edema Control - Lymphedema / SCD / Other Lymphedema Pumps. Use Lymphedema pumps on leg(s) 2-3 times a day for 45-60 minutes. If wearing any wraps or hose, do not remove them. Continue exercising as instructed. - use 1-2 times per day over wraps Elevate legs to the level of the heart or above for 30 minutes daily and/or when sitting, a frequency of: Exercise regularly Wound Treatment Wound #1 - Foot Wound Laterality: Dorsal, Left Peri-Wound Care: Sween Lotion (Moisturizing lotion) 1 x Per Week Discharge Instructions: Apply moisturizing lotion as directed Prim  Dressing: KerraCel Ag Gelling Fiber Dressing, 4x5 in (silver alginate) 1 x Per Week ary Discharge Instructions: Apply silver alginate to wound bed as instructed Secondary Dressing: Woven Gauze Sponge, Non-Sterile 4x4 in 1 x Per Week Discharge Instructions: Apply over primary dressing as directed. Compression Wrap: ThreePress (3 layer compression wrap) 1 x Per Week Discharge Instructions: Apply three layer compression as directed. Wound #3 - Lower Leg Wound Laterality: Right, Medial Peri-Wound Care: Zinc Oxide Ointment 30g tube 1 x Per Week Discharge Instructions: Apply Zinc Oxide to periwound with each dressing change Peri-Wound Care: Sween Lotion (Moisturizing lotion) 1 x Per Week Discharge Instructions: Apply moisturizing lotion as directed Topical: Gentamicin 1 x Per Week Discharge Instructions: mix with sodium thiosulfate 1:1 Topical: sodium thiosulfate ointment 1 x Per Week Discharge Instructions: apply to wound bed Secondary Dressing: ABD Pad, 8x10 1 x Per Week Discharge Instructions: Apply over primary dressing as directed. Secondary Dressing: Woven Gauze Sponge, Non-Sterile 4x4 in 1 x Per Week Discharge Instructions: Apply over primary dressing as directed. Compression Wrap: ThreePress (3 layer compression wrap)  1 x Per Week Discharge Instructions: Apply three layer compression as directed. Wound #6 - Lower Leg Wound Laterality: Right, Anterior Peri-Wound Care: Zinc Oxide Ointment 30g tube 1 x Per Week Discharge Instructions: Apply Zinc Oxide to periwound with each dressing change Peri-Wound Care: Sween Lotion (Moisturizing lotion) 1 x Per Week Discharge Instructions: Apply moisturizing lotion as directed Topical: Gentamicin 1 x Per Week Discharge Instructions: mix with sodium thiosulfate 1:1 Topical: sodium thiosulfate ointment 1 x Per Week Discharge Instructions: apply to wound bed Secondary Dressing: ABD Pad, 8x10 1 x Per Week Discharge Instructions: Apply over primary  dressing as directed. Secondary Dressing: Woven Gauze Sponge, Non-Sterile 4x4 in 1 x Per Week Discharge Instructions: Apply over primary dressing as directed. Compression Wrap: ThreePress (3 layer compression wrap) 1 x Per Week Discharge Instructions: Apply three layer compression as directed. Patient Medications llergies: No Known Allergies A DIVON, KRABILL ([id number]) [id number] Page 7 of 14 Notifications Medication Indication Start End prior to debridement 05/19/2022 lidocaine DOSE topical 5 % ointment - ointment topical Electronic Signature(s) Signed: 05/19/2022 3:07:17 PM By: Fredirick Maudlin MD FACS Entered By: Fredirick Maudlin on 05/19/2022 13:45:44 -------------------------------------------------------------------------------- Problem List Details Patient Name: Date of Service: [name], [name] 05/19/2022 12:30 PM Medical Record Number: [id number] Patient Account Number: [id number] Date of Birth/Sex: Treating RN: 1951/04/08 (71 y.o. [name] Primary Care Provider: PA [name], [place] Other Clinician: Referring Provider: Treating Provider/Extender: [name] in Treatment: 14 Active Problems ICD-10 Encounter Code Description Active Date MDM Diagnosis L97.812 Non-pressure chronic ulcer of other part of right lower leg with fat layer 02/09/2022 No Yes exposed L97.522 Non-pressure chronic ulcer of other part of left foot with fat layer exposed 02/09/2022 No Yes 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 02/09/2022 No Yes 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) 02/09/2022 No Yes I10 Essential (primary) hypertension 02/09/2022 No Yes I25.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 02/09/2022 No Yes I89.0 Lymphedema, not elsewhere classified 02/09/2022 No Yes Z86.718 Personal history of other venous thrombosis and embolism 02/09/2022 No Yes Inactive Problems ICD-10 Code Description Active Date  Inactive Date L97.512 Non-pressure chronic ulcer of other part of right foot with fat layer exposed 02/17/2022 02/17/2022 [name] ([id number]) [id number] Page 8 of 14 Resolved Problems Electronic Signature(s) Signed: 05/19/2022 1:42:50 PM By: Fredirick Maudlin MD FACS Entered By: Fredirick Maudlin on 05/19/2022 13:42:50 -------------------------------------------------------------------------------- Progress Note Details Patient Name: Date of Service: [name], [name] 05/19/2022 12:30 PM Medical Record Number: [id number] Patient Account Number: [id number] Date of Birth/Sex: Treating RN: [birth year] (71 y.o. M) Primary Care Provider: PA [name], NO Other Clinician: Referring Provider: Treating Provider/Extender: [name] in Treatment: 14 Subjective Chief Complaint Information obtained from Patient Patient seen for complaints of Non-Healing Wounds. History of Present Illness (HPI) ADMISSION 02/09/2022 This is a 71 year old man who recently moved to [place] from [place]. He has a history of of coronary artery disease and prior left popliteal vein DVT . He is not diabetic and does not smoke. 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 He has had multiple spinal surgeries and has limited mobility. He primarily uses a motorized wheelchair. He has had lymphedema for many years and does have lymphedema pumps although he says he does not use them frequently. He has wounds on his bilateral lower extremities. He was receiving care in [place] for these prior to his move. They were apparently packing his wounds with Betadine soaked gauze. He has worn compression wraps in the past but  not recently. He did say that they helped tremendously. In clinic today, his right ABI is noncompressible but has good signals; the left ABI was 1.17. No formal vascular studies have been performed. On his left dorsal foot, there is a circular lesion  that exposes the fat layer. There is fairly heavy slough accumulation within the wound, but no significant odor. On his right medial calf, he has multiple pinholes that have slough buildup in them and probe for about 2 to 4 mm in depth. They are draining clear fluid. On his right lateral midfoot, he has ulceration consistent with venous stasis. It is geographic and has slough accumulation. 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 02/17/2022: All of the wounds are little bit larger today and have more slough accumulation. Today, the medial calf openings are larger and probe to something that feels calcified. There is odor coming from his wounds. His vascular studies are scheduled for August 9 and 10. 02-24-2022 upon evaluation today patient presents for follow-up concerning his ongoing issues here with his lower extremities. With that being said he does have significant problems with what appears to be cellulitis unfortunately the PCR culture that was sent last week got crushed by [employer] in transit. With that being said we are going to reobtain a culture today although I am actually to do it on the right medial leg as this seems to be the most inflamed area today where there is some questionable drainage as well. I am going to remove some of the calcium deposits which are loosening obtain a deeper culture from this area. Fortunately I do not see any evidence of active infection systemically which is good news but locally I think we are having a bigger issue here. He does have his appointment next Thursday with vascular. Patient has also been referred to [facility] for further evaluation and treatment of the calcinosis for possible sulfate injections. 03/04/2022: The culture that was performed last week grew out multiple species including Klebsiella, Morganella, and Pseudomonas aeruginosa. He had been prescribed  Bactrim but this was not adequate to cover all of the species and levofloxacin was recommended. He completed the Bactrim but did not pick up the levofloxacin and begin taking it until yesterday. 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 He had arterial studies performed today. He does have evidence of peripheral arterial disease. Results copied here: +-------+-----------+-----------+------------+------------+ ABI/TBIT oday's ABIT oday's TBIPrevious ABIPrevious TBI +-------+-----------+-----------+------------+------------+ Right [place] 0.75    +-------+-----------+-----------+------------+------------+ Left [place] 0.57    +-------+-----------+-----------+------------+------------+ Arterial wall calcification precludes accurate ankle pressures and ABIs. Summary: Right: Resting right ankle-brachial index indicates noncompressible right lower extremity arteries. The right toe-brachial index is normal. Left: Resting left ankle-brachial index indicates noncompressible left lower extremity arteries. The left toe-brachial index is abnormal. He continues to have further tissue breakdown at the right medial calf and there is ample slough accumulation along with necrotic subcutaneous tissue. The left dorsal foot wound has built up slough, as well. The right medial foot wound is nearly closed with just a light layer of eschar over the surface. The right dorsal [name], [name] ([id number]) [id number] Page 9 of 14 lateral foot wound has also accumulated a fair amount of slough and remains quite tender. 03/10/2022: He has been taking the prescribed levofloxacin and has  about 3 days left of this. The erythema and induration on his right leg has improved. He continues to experience further breakdown of the right  medial calf wound. There is extensive slough and debris accumulation there. There is heavy slough on his left dorsal foot wound, but no odor or purulent drainage. The right medial foot wound appears to be closed. The right dorsal lateral foot wound also has a fair amount of slough but it is less tender than at our last visit. He still does not have an appointment with dermatology. 03/22/2022: The right anterior tibial wound has closed. The right lateral foot wound is nearly closed with just a little bit of slough. The left dorsal foot wound is smaller and continues to have some slough buildup but less so than at prior visits. There is good granulation tissue underlying the slough. Unfortunately the right medial calf wound continues to deteriorate. It has a foul odor today and a lot of necrotic tissue, the surrounding skin is also erythematous and moist. 03/30/2022: The right lateral foot wound continues to contract and just has a little bit of slough and eschar present. The left dorsal foot wound is also smaller with slough overlying good granulation tissue. The right medial calf wound actually looks quite a bit better today; there is no odor and there is much less necrotic tissue although some remains present. We have been using topical gentamicin and he has been taking oral Augmentin. 04/07/2022: The patient saw dermatology at [facility] last week. Unfortunately, it appears that they did not read any of the documentation that was provided. 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 Overall, it was a highly unsatisfactory consultation. In the meantime, however, we were able to identify compounding pharmacy who could create a topical sodium  thiosulfate ointment. The patient has that with him today. Both of the right foot wounds are now closed. The left dorsal foot wound has contracted but still has a layer of slough on the surface. The medial calf wounds on the right all look a little bit better. They still have heavy slough along with the chunks of calcium. Everything is stained purple. 04/14/2022: The wound on his dorsal left foot is a little bit smaller again today. There is still slough accumulation on the surface. The medial calf wounds have quite a bit less slough accumulation today. His right leg, however, is warm and erythematous, concerning for cellulitis. 04/14/2022: He completed his course of Augmentin yesterday. The medial calf wound sites are much cleaner today and shallower. There is less fragmented calcium in the wounds. He has a lot of lymphedema fluid-like drainage from these wounds, but no purulent discharge or malodor. The wound on his dorsal left foot has also contracted and is shallower. There is a layer of slough over healthy granulation tissue. 05/05/2022: The left dorsal foot wound is smaller today with less slough and more granulation tissue. The right medial calf wounds are shallower, but have extensive slough and some fat necrosis. The drainage has diminished considerably, however. 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 05/12/2022: The left dorsal foot wound continues to contract and fill with  granulation tissue. There is slough on the wound surface. The right medial calf wounds also continue to fill with healthy tissue, but there is remaining slough and fat necrosis present. He had a significant increase in his drainage this week and it was a bright yellow-green color. He also had a new wound open over his right anterior tibial surface associated with the spike of cutaneous calcium. The leg  is more red, as well, concerning for infection. 05/19/2022: His culture returned positive for Pseudomonas. He is currently taking a course of oral levofloxacin. We have also been using topical gentamicin mixed in with his sodium thiosulfate. All of the wounds look better this week. 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 There is slough and nonviable subcutaneous tissue still present. The new anterior tibial wound just has a light layer of slough. The dorsal foot wound also has some slough present and appears a little dry today. Patient History Information obtained from Patient, Chart. Family History Cancer - Mother, Heart Disease - Father,Mother, Hypertension - Father,Siblings, Lung Disease - Siblings, No family history of Diabetes, Hereditary Spherocytosis, Kidney Disease, Seizures, Stroke, Thyroid Problems, Tuberculosis. Social History Former smoker - quit 1991, Marital Status - Married, Alcohol Use - Moderate, Drug Use - No History, Caffeine Use - Daily - coffee. Medical History Eyes Patient has history of Cataracts - right eye removed Denies history of Glaucoma, Optic Neuritis Ear/Nose/Mouth/Throat Denies history of Chronic sinus problems/congestion, Middle ear problems Hematologic/Lymphatic Patient has history of Anemia - macrocytic, Lymphedema Cardiovascular Patient has history of Congestive Heart Failure, Deep Vein Thrombosis - left leg, Hypertension, Peripheral Venous Disease Endocrine Denies history of Type I Diabetes, Type II Diabetes Genitourinary Denies history of End Stage Renal Disease Integumentary (Skin) Denies history of History of Burn Oncologic Denies history of Received Chemotherapy, Received Radiation Psychiatric Denies history of Anorexia/bulimia, Confinement Anxiety Hospitalization/Surgery History - cervical fusion. - lumbar surgery. - coronary stent placement. - lasik eye surgery. - hydrocele excision. Medical A Surgical  History Notes nd Constitutional Symptoms (General Health) obesity Ear/Nose/Mouth/Throat hard of hearing Respiratory frozen diaphragm right Cardiovascular hyperlipidemia Musculoskeletal myositis, lumbar DDD, spinal stenosis, cervical facet joint syndrome [name], [name] ([id number]) [id number] Page 10 of 14 Objective Constitutional No acute distress.. Vitals Time Taken: 12:45 PM, Height: 71 in, Weight: 267 lbs, BMI: 37.2, Temperature: 97.9 F, Pulse: 64 bpm, Respiratory Rate: 20 breaths/min, Blood Pressure: 132/75 mmHg. Respiratory Normal work of breathing on room air.. General Notes: 05/19/2022: All of the wounds look better this week. 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 There is slough and nonviable subcutaneous tissue still present. The new anterior tibial wound just has a light layer of slough. The dorsal foot wound also has some slough present and appears a little dry today. Integumentary (Hair, Skin) Wound #1 status is Open. Original cause of wound was Gradually Appeared. The date acquired was: 12/07/2021. The wound has been in treatment 14 weeks. The wound is located on the Left,Dorsal Foot. The wound measures 1.5cm length x 1.4cm width x 0.1cm depth; 1.649cm^2 area and 0.165cm^3 volume. There is Fat Layer (Subcutaneous Tissue) exposed. There is no tunneling or undermining noted. There is a medium amount of serosanguineous drainage noted. The wound margin is distinct with the outline attached to the wound base. There is medium (34-66%) pink granulation within the wound bed. There is a medium (34- 66%) amount of necrotic tissue within the wound bed including Adherent Slough. The periwound skin appearance had no abnormalities  noted for texture. The periwound skin appearance had no abnormalities noted for moisture. The periwound skin appearance had no abnormalities noted for color. Periwound temperature was noted as No  Abnormality. The periwound has tenderness on palpation. Wound #3 status is Open. Original cause of wound was Gradually Appeared. The date acquired was: 12/07/2021. The wound has been in treatment 14 weeks. The wound is located on the Right,Medial Lower Leg. The wound measures 3.3cm length x 3.8cm width x 0.7cm depth; 9.849cm^2 area and 6.894cm^3 volume. There is Fat Layer (Subcutaneous Tissue) exposed. There is no tunneling or undermining noted. There is a large amount of purulent drainage noted. The wound margin is distinct with the outline attached to the wound base. There is medium (34-66%) red, friable granulation within the wound bed. There is a medium (34- 66%) amount of necrotic tissue within the wound bed including Adherent Slough. The periwound skin appearance had no abnormalities noted for texture. The periwound skin appearance had no abnormalities noted for moisture. The periwound skin appearance exhibited: Hemosiderin Staining, Erythema. The surrounding wound skin color is noted with erythema with red streaks. Erythema is measured. Periwound temperature was noted as Hot. The periwound has tenderness on palpation. Wound #6 status is Open. Original cause of wound was Gradually Appeared. The date acquired was: 05/12/2022. The wound has been in treatment 1 weeks. The wound is located on the Right,Anterior Lower Leg. The wound measures 0.5cm length x 0.4cm width x 0.1cm depth; 0.157cm^2 area and 0.016cm^3 volume. There is Fat Layer (Subcutaneous Tissue) exposed. There is no tunneling or undermining noted. There is a medium amount of serosanguineous drainage noted. The wound margin is distinct with the outline attached to the wound base. There is small (1-33%) red granulation within the wound bed. There is a large (67-100%) amount of necrotic tissue within the wound bed including Adherent Slough. The periwound skin appearance had no abnormalities noted for texture. The periwound skin appearance  had no abnormalities noted for moisture. The periwound skin appearance exhibited: Hemosiderin Staining. Periwound temperature was noted as No Abnormality. The periwound has tenderness on palpation. 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 There was a Excisional Skin/Subcutaneous Tissue Debridement with a total area of 2.1 sq cm performed by Fredirick Maudlin, MD. With the following instrument(s): Curette to remove Viable and Non-Viable tissue/material. Material removed includes Subcutaneous Tissue, Slough, and Other: calcium deposits after achieving pain control using Lidocaine 5% topical ointment. No specimens were taken. A time out was conducted at 13:20, prior to the start of the procedure. A Minimum amount of bleeding was controlled with Pressure. The procedure was tolerated well with a pain level of 0 throughout and a pain level of 0 following the procedure. Post Debridement Measurements: 1.5cm length x 1.4cm width x 0.1cm depth; 0.165cm^3 volume. Character of Wound/Ulcer Post Debridement is improved. Post procedure Diagnosis Wound #1: Same as Pre-Procedure General Notes: scribed by Baruch Gouty, RN for Dr. [name]. Pre-procedure diagnosis of Wound #1 is a Lymphedema located on the Left,Dorsal Foot . There was a Three Layer Compression Therapy Procedure by Baruch Gouty, RN.  [name], [name] ([id number]) [id number] Page 11 of 14 Post  procedure Diagnosis Wound #1: Same as Pre-Procedure Wound #3 Pre-procedure diagnosis of Wound #3 is a Lymphedema located on the Right,Medial Lower Leg . There was a Excisional Skin/Subcutaneous Tissue Debridement with a total area of 12.54 sq cm performed by Fredirick Maudlin, MD. With the following instrument(s): Curette to remove Viable and Non-Viable tissue/material. Material removed includes Subcutaneous Tissue, Slough, and Other: calcium deposits after achieving pain control using Lidocaine 5% topical ointment. No specimens were taken. A time out was conducted at 13:20, prior to the start of the procedure. A Minimum amount of bleeding was controlled with Pressure. The procedure was tolerated well with a pain level of 0 throughout and a pain level of 0 following the procedure. Post Debridement Measurements: 3.3cm length x 3.8cm width x 0.7cm depth; 6.894cm^3 volume. Character of Wound/Ulcer Post Debridement is improved. Post procedure Diagnosis Wound #3: Same as Pre-Procedure General Notes: scribed by Baruch Gouty, RN for Dr. [name]. Pre-procedure diagnosis of Wound #3 is a Lymphedema located on the Right,Medial Lower Leg . There was a Three Layer Compression Therapy Procedure by Baruch Gouty, RN. 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 There was a Selective/Open Wound Non-Viable Tissue Debridement with a total area of 0.2 sq cm performed by Fredirick Maudlin, MD. With the following instrument(s): Curette to remove Non-Viable tissue/material. Material removed includes [facility] and Other: calcium deposits after achieving pain control using Lidocaine 5% topical ointment. No specimens were taken. A time out was conducted at 13:20, prior to the start of the procedure. A Minimum amount of bleeding was controlled with Pressure. The procedure was tolerated well with a pain level of  0 throughout and a pain level of 0 following the procedure. Post Debridement Measurements: 0.5cm length x 0.4cm width x 0.1cm depth; 0.016cm^3 volume. Character of Wound/Ulcer Post Debridement is improved. Post procedure Diagnosis Wound #6: Same as Pre-Procedure General Notes: scribed by Baruch Gouty, RN for Dr. [name]. Plan Follow-up Appointments: Return Appointment in 1 week. - Dr. [name] Rm 1 Anesthetic: Wound #1 Left,Dorsal Foot: (In clinic) Topical Lidocaine 5% applied to wound bed (In clinic) Topical Lidocaine 4% applied to wound bed Wound #3 Right,Medial Lower Leg: (In clinic) Topical Lidocaine 5% applied to wound bed (In clinic) Topical Lidocaine 4% applied to wound bed Wound #6 Right,Anterior Lower Leg: (In clinic) Topical Lidocaine 5% applied to wound bed (In clinic) Topical Lidocaine 4% applied to wound bed Bathing/ Shower/ Hygiene: May shower with protection but do not get wound dressing(s) wet. Edema Control - Lymphedema / SCD / Other: Lymphedema Pumps. Use Lymphedema pumps on leg(s) 2-3 times a day for 45-60 minutes. If wearing any wraps or hose, do not remove them. Continue exercising as instructed. - use 1-2 times per day over wraps Elevate legs to the level of the heart or above for 30 minutes daily and/or when sitting, a frequency of: Exercise regularly The following medication(s) was prescribed: lidocaine topical 5 % ointment ointment topical for prior to debridement was prescribed at facility WOUND #1: - Foot Wound Laterality: Dorsal, Left Peri-Wound Care: Sween Lotion (Moisturizing lotion) 1 x Per Week/ Discharge Instructions: Apply moisturizing lotion as directed Prim Dressing: KerraCel Ag Gelling Fiber Dressing, 4x5 in (silver alginate) 1 x Per Week/ ary Discharge Instructions: Apply silver alginate to wound bed as instructed Secondary Dressing: Woven Gauze Sponge, Non-Sterile 4x4 in 1 x Per  Week/ Discharge Instructions: Apply over primary dressing as  directed. Com pression Wrap: ThreePress (3 layer compression wrap) 1 x Per Week/ Discharge Instructions: Apply three layer compression as directed. WOUND #3: - Lower Leg Wound Laterality: Right, Medial Peri-Wound Care: Zinc Oxide Ointment 30g tube 1 x Per Week/ Discharge Instructions: Apply Zinc Oxide to periwound with each dressing change Peri-Wound Care: Sween Lotion (Moisturizing lotion) 1 x Per Week/ Discharge Instructions: Apply moisturizing lotion as directed Topical: Gentamicin 1 x Per Week/ Discharge Instructions: mix with sodium thiosulfate 1:1 Topical: sodium thiosulfate ointment 1 x Per Week/ Discharge Instructions: apply to wound bed Secondary Dressing: ABD Pad, 8x10 1 x Per Week/ Discharge Instructions: Apply over primary dressing as directed. Secondary Dressing: Woven Gauze Sponge, Non-Sterile 4x4 in 1 x Per Week/ Discharge Instructions: Apply over primary dressing as directed. Com pression Wrap: ThreePress (3 layer compression wrap) 1 x Per Week/ Discharge Instructions: Apply three layer compression as directed. WOUND #6: - Lower Leg Wound Laterality: Right, Anterior Peri-Wound Care: Zinc Oxide Ointment 30g tube 1 x Per Week/ Discharge Instructions: Apply Zinc Oxide to periwound with each dressing change Peri-Wound Care: Sween Lotion (Moisturizing lotion) 1 x Per Week/ Discharge Instructions: Apply moisturizing lotion as directed Topical: Gentamicin 1 x Per Week/ Discharge Instructions: mix with sodium thiosulfate 1:1 Topical: sodium thiosulfate ointment 1 x Per Week/ Discharge Instructions: apply to wound bed Secondary Dressing: ABD Pad, 8x10 1 x Per Domingo Dimes, [name] ([id number]) [id number] Page 12 of 14 Discharge Instructions: Apply over primary dressing as directed. Secondary Dressing: Woven Gauze Sponge, Non-Sterile 4x4 in 1 x Per Week/ Discharge Instructions: Apply over primary dressing as directed. Compression Wrap: ThreePress (3  layer compression wrap) 1 x Per Week/ Discharge Instructions: Apply three layer compression as directed. 05/19/2022: All of the wounds look better this week. 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 There is slough and nonviable subcutaneous tissue still present. The new anterior tibial wound just has a light layer of slough. The dorsal foot wound also has some slough present and appears a little dry today. I used a curette to debride slough from the anterior tibial wound and the dorsal foot wound. I debrided slough and nonviable subcutaneous tissue from the medial calf wound on the right. We will continue to use topical sodium thiosulfate mixed with gentamicin in the medial calf wound. Continue silver alginate to the other sites. Continue 3 layer compression. He should complete his course of oral levofloxacin. Follow-up in 1 week. Electronic Signature(s) Signed: 05/19/2022 1:46:55 PM By: Fredirick Maudlin MD FACS Entered By: Fredirick Maudlin on 05/19/2022 13:46:55 -------------------------------------------------------------------------------- HxROS Details Patient Name: Date of Service: UZZIAH, RIGG [name] 05/19/2022 12:30 PM Medical Record Number: [id number] Patient Account Number: [id number] Date of Birth/Sex: Treating RN: 1951/03/28 (71 y.o. M) Primary Care Provider: PA [name], NO Other Clinician: Referring Provider: Treating Provider/Extender: [name] in Treatment: 14 Information Obtained From Patient Chart Constitutional Symptoms (General Health) Medical History: Past Medical History Notes: obesity Eyes Medical History: Positive for: Cataracts - right eye removed Negative for: Glaucoma; Optic Neuritis Ear/Nose/Mouth/Throat Medical History: Negative for: Chronic sinus problems/congestion; Middle ear problems Past Medical History Notes: hard of hearing Hematologic/Lymphatic Medical History: Positive for: Anemia -  macrocytic; Lymphedema Respiratory Medical History: Past Medical History Notes: frozen diaphragm right Cardiovascular Medical History: Positive for: Congestive Heart Failure; Deep Vein Thrombosis - left leg; Hypertension; Peripheral Venous Disease Past Medical History Notes: hyperlipidemia [name], [name] ([id number]) [id number] Page 13 of 14 Endocrine Medical  History: Negative for: Type I Diabetes; Type II Diabetes Genitourinary Medical History: Negative for: End Stage Renal Disease Integumentary (Skin) Medical History: Negative for: History of Burn Musculoskeletal Medical History: Past Medical History Notes: myositis, lumbar DDD, spinal stenosis, cervical facet joint syndrome Oncologic Medical History: Negative for: Received Chemotherapy; Received Radiation Psychiatric Medical History: Negative for: Anorexia/bulimia; Confinement Anxiety HBO Extended History Items Eyes: Cataracts Immunizations Pneumococcal Vaccine: Received Pneumococcal Vaccination: Yes Received Pneumococcal Vaccination On or After 60th Birthday: Yes Implantable Devices No devices added Hospitalization / Surgery History Type of Hospitalization/Surgery cervical fusion lumbar surgery coronary stent placement lasik eye surgery hydrocele excision Family and Social History Cancer: Yes - Mother; Diabetes: No; Heart Disease: Yes - Father,Mother; Hereditary Spherocytosis: No; Hypertension: Yes - Father,Siblings; Kidney Disease: No; Lung Disease: Yes - Siblings; Seizures: No; Stroke: No; Thyroid Problems: No; Tuberculosis: No; Former smoker - quit 1991; Marital Status - Married; Alcohol Use: Moderate; Drug Use: No History; Caffeine Use: Daily - coffee; Financial Concerns: No; Food, Clothing or Shelter Needs: No; Support System Lacking: No; Transportation Concerns: No Electronic Signature(s) Signed: 05/19/2022 3:07:17 PM By: Fredirick Maudlin MD FACS Entered By: Fredirick Maudlin on  05/19/2022 13:44:37 -------------------------------------------------------------------------------- SuperBill Details Patient Name: Date of Service: SAKAI, WOLFORD 05/19/2022 Medical Record Number: [id number] Patient Account Number: [id number] Date of Birth/Sex: Treating RN: Oct 09, 1950 (71 y.o. 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) I10 Essential (primary) hypertension I25.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 I89.0 Lymphedema, not elsewhere classified Z86.718 Personal history of other venous thrombosis and embolism Facility Procedures : CPT4 Code: [id number] Description: 21224 - DEB SUBQ TISSUE 20 SQ CM/< ICD-10 Diagnosis Description M25.003 Non-pressure chronic ulcer of other part of right lower leg with fat layer expos Modifier: ed Quantity: 1 : CPT4 Code: [id number] Description: 69450 - DEBRIDE WOUND 1ST 20 SQ CM OR < ICD-10 Diagnosis Description L97.522 Non-pressure chronic ulcer of other part of left foot with fat layer exposed Modifier: Quantity: 1 Physician Procedures : CPT4 Code Description Modifier [id number] 03491 - WC PHYS LEVEL 4 - EST PT 25 ICD-10 Diagnosis Description 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d L97.522 Non-pressure chronic ulcer of other part of left  foot with fat layer exposed 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

## 2022-05-20 NOTE — Progress Notes (Signed)
GIANMARCO, ROYE ([id number]) [id number] Page 1 of 11 Visit Report for 05/19/2022 Arrival Information Details Patient Name: Date of Service: RANSON, BELLUOMINI 05/19/2022 12:30 PM Medical Record Number: [id number] Patient Account Number: [id number] Date of Birth/Sex: Treating RN: [birth year] (71 y.o. M) Primary Care Zaydah Nawabi: PA [name], [place] Other Clinician: Referring Demarie Hyneman: Treating Abhiraj Dozal/Extender: [name] in Treatment: 14 Visit Information History Since Last Visit All ordered tests and consults were completed: No Patient Arrived: Wheel Chair Added or deleted any medications: No Arrival Time: 12:46 Any new allergies or adverse reactions: No Transfer Assistance: None Had a fall or experienced change in No Patient Identification Verified: Yes activities of daily living that may affect Secondary Verification Process Completed: Yes risk of falls: Patient Requires Transmission-Based Precautions: No Signs or symptoms of abuse/neglect since last visito No Patient Has Alerts: Yes Hospitalized since last visit: No Patient Alerts: R ABI= N/C, TBI= .75 Implantable device outside of the clinic excluding No L ABI =N/C, TBI = .57 cellular tissue based products placed in the center since last visit: Has Dressing in Place as Prescribed: Yes Has Compression in Place as Prescribed: Yes Pain Present Now: No Electronic Signature(s) Signed: 05/19/2022 6:06:53 PM By: Baruch Gouty RN, BSN Entered By: Baruch Gouty on 05/19/2022 13:10:12 -------------------------------------------------------------------------------- Compression Therapy Details Patient Name: Date of Service: KHYRIN, TREVATHAN 05/19/2022 12:30 PM Medical Record Number: [id number] Patient Account Number: [id number] Date of Birth/Sex: Treating RN: [birth year] (71 y.o. [name] Primary Care Vanecia Limpert: PA [name], [place] Other Clinician: Referring Madinah Quarry: Treating  Anakaren Campion/Extender: [name] in Treatment: 14 Compression Therapy Performed for Wound Assessment: Wound #3 Right,Medial Lower Leg Performed By: Clinician Baruch Gouty, RN Compression Type: Three Layer Post Procedure Diagnosis Same as Pre-procedure Electronic Signature(s) Signed: 05/19/2022 6:06:53 PM By: Baruch Gouty RN, BSN Entered By: Baruch Gouty on 05/19/2022 13:27:26 [name] ([id number]) [id number] Page 2 of 11 -------------------------------------------------------------------------------- Compression Therapy Details Patient Name: Date of Service: ELSWORTH, LEDIN 05/19/2022 12:30 PM Medical Record Number: [id number] Patient Account Number: [id number] Date of Birth/Sex: Treating RN: [birth year] (72 y.o. [name] Primary Care Linde Wilensky: PA [name], [place] Other Clinician: Referring Lynna Zamorano: Treating Brodin Gelpi/Extender: [name] in Treatment: 14 Compression Therapy Performed for Wound Assessment: Wound #1 Left,Dorsal Foot Performed By: Clinician Baruch Gouty, RN Compression Type: Three Layer Post Procedure Diagnosis Same as Pre-procedure Electronic Signature(s) Signed: 05/19/2022 6:06:53 PM By: Baruch Gouty RN, BSN Entered By: Baruch Gouty on 05/19/2022 13:27:26 -------------------------------------------------------------------------------- Encounter Discharge Information Details Patient Name: Date of Service: ALSTON, BERRIE 05/19/2022 12:30 PM Medical Record Number: [id number] Patient Account Number: [id number] Date of Birth/Sex: Treating RN: 03/10/1951 (71 y.o. [name] Primary Care Gustin Zobrist: PA [name], [place] Other Clinician: Referring Justice Aguirre: Treating Cameryn Schum/Extender: [name] in Treatment: 14 Encounter Discharge Information Items Post Procedure Vitals Discharge Condition: Stable Temperature (F): 97.9 Ambulatory Status:  Wheelchair Pulse (bpm): 64 Discharge Destination: Home Respiratory Rate (breaths/min): 18 Transportation: Private Auto Blood Pressure (mmHg): 132/75 Accompanied By: self Schedule Follow-up Appointment: Yes Clinical Summary of Care: Patient Declined Electronic Signature(s) Signed: 05/19/2022 6:06:53 PM By: Baruch Gouty RN, BSN Entered By: Baruch Gouty on 05/19/2022 14:07:56 -------------------------------------------------------------------------------- Lower Extremity Assessment Details Patient Name: Date of Service: DEMARIE, HYNEMAN 05/19/2022 12:30 PM Medical Record Number: [id number] Patient Account Number: [id number] Date of Birth/Sex: Treating RN: 1951-02-01 (71 y.o. [name] Primary Care Lue Dubuque: PA [name], [place] Other Clinician: Referring Jera Headings: Treating Rohit Deloria/Extender: Valeria Batman Weeks in Treatment: 14 Edema  Assessment Left: [Left: Right] [Right: :] Assessed: [Left: No] [Right: No] Edema: [Left: Yes] [Right: Yes] Calf Left: Right: Point of Measurement: From Medial Instep 41.2 cm 43.5 cm Ankle Left: Right: Point of Measurement: From Medial Instep 25.2 cm 27.8 cm Vascular Assessment Pulses: Dorsalis Pedis Palpable: [Left:Yes] [Right:Yes] Electronic Signature(s) Signed: 05/19/2022 6:06:53 PM By: Baruch Gouty RN, BSN Entered By: Baruch Gouty on 05/19/2022 13:12:21 -------------------------------------------------------------------------------- Multi Wound Chart Details Patient Name: Date of Service: [name] 05/19/2022 12:30 PM Medical Record Number: [id number] Patient Account Number: [id number] Date of Birth/Sex: Treating RN: 05-19-1951 (71 y.o. M) Primary Care Kristy Schomburg: PA [name], [place] Other Clinician: Referring Aysia Lowder: Treating Embree Brawley/Extender: [name], [name] in Treatment: 14 Vital Signs Height(in): 71 Pulse(bpm): 49 Weight(lbs): 267 Blood Pressure(mmHg): 132/75 Body Mass Index(BMI):  37.2 Temperature(F): 97.9 Respiratory Rate(breaths/min): 20 [1:Photos:] Left, Dorsal Foot Right, Medial Lower Leg Right, Anterior Lower Leg Wound Location: Gradually Appeared Gradually Appeared Gradually Appeared Wounding Event: Lymphedema Lymphedema Calcinosis Primary Etiology: Cataracts, Anemia, Lymphedema, Cataracts, Anemia, Lymphedema, Cataracts, Anemia, Lymphedema, Comorbid History: Congestive Heart Failure, Deep Vein Congestive Heart Failure, Deep Vein Congestive Heart Failure, Deep Vein Thrombosis, Hypertension, Peripheral Thrombosis, Hypertension, Peripheral Thrombosis, Hypertension, Peripheral Venous Disease Venous Disease Venous Disease 12/07/2021 12/07/2021 05/12/2022 Date Acquired: '14 14 1 '$ Weeks of Treatment: Open Open Open Wound Status: No No No Wound Recurrence: 1.5x1.4x0.1 3.3x3.8x0.7 0.5x0.4x0.1 Measurements L x W x D (cm) 1.649 9.849 0.157 A (cm) : rea 0.165 6.894 0.016 Volume (cm) : 56.50% -84.40% 33.50% % Reduction in Area: 89.10% -222.80% 33.30% % Reduction in Volume: Full Thickness Without Exposed Full Thickness With Exposed Support Full Thickness Without Exposed Classification: Support Structures Structures Support Structures MEHAR, KIRKWOOD ([id number]) [id number] Page 4 of 11 Medium Large Medium Exudate Amount: Serosanguineous Purulent Serosanguineous Exudate Type: red, brown yellow, brown, green red, brown Exudate Color: Distinct, outline attached Distinct, outline attached Distinct, outline attached Wound Margin: Medium (34-66%) Medium (34-66%) Small (1-33%) Granulation Amount: Pink Red, Friable Red Granulation Quality: Medium (34-66%) Medium (34-66%) Large (67-100%) Necrotic Amount: Fat Layer (Subcutaneous Tissue): Yes Fat Layer (Subcutaneous Tissue): Yes Fat Layer (Subcutaneous Tissue): Yes Exposed Structures: Fascia: No Fascia: No Fascia: No Tendon: No Tendon: No Tendon: No Muscle: No Muscle:  No Muscle: No Joint: No Joint: No Joint: No Bone: No Bone: No Bone: No Small (1-33%) Small (1-33%) None Epithelialization: Debridement - Excisional Debridement - Excisional Debridement - Selective/Open Wound Debridement: Pre-procedure Verification/Time Out 13:20 13:20 13:20 Taken: Lidocaine 5% topical ointment Lidocaine 5% topical ointment Lidocaine 5% topical ointment Pain Control: Subcutaneous, Slough Other, Subcutaneous, [employer] Tissue Debrided: Skin/Subcutaneous Tissue Skin/Subcutaneous Tissue Non-Viable Tissue Level: 2.1 12.54 0.2 Debridement A (sq cm): rea Curette Curette Curette Instrument: Minimum Minimum Minimum Bleeding: Pressure Pressure Pressure Hemostasis A chieved: 0 0 0 Procedural Pain: 0 0 0 Post Procedural Pain: Procedure was tolerated well Procedure was tolerated well Procedure was tolerated well Debridement Treatment Response: 1.5x1.4x0.1 3.3x3.8x0.7 0.5x0.4x0.1 Post Debridement Measurements L x W x D (cm) 0.165 6.894 0.016 Post Debridement Volume: (cm) No Abnormalities Noted Excoriation: Yes No Abnormalities Noted Periwound Skin Texture: No Abnormalities Noted No Abnormalities Noted No Abnormalities Noted Periwound Skin Moisture: Erythema: Yes Erythema: Yes Hemosiderin Staining: Yes Periwound Skin Color: Hemosiderin Staining: Yes N/A Red Streaks N/A Erythema Location: N/A Measured N/A Erythema Measurement: No Change N/A N/A Erythema Change: No Abnormality Hot No Abnormality Temperature: Yes Yes Yes Tenderness on Palpation: Compression Therapy Compression Therapy Debridement Procedures Performed: Debridement Debridement Treatment Notes Electronic Signature(s) Signed: 05/19/2022 1:42:58 PM By: Fredirick Maudlin  MD FACS Entered By: Fredirick Maudlin on 05/19/2022 13:42:58 -------------------------------------------------------------------------------- Multi-Disciplinary Care Plan Details Patient Name: Date of Service: ILAN, KAHRS 05/19/2022 12:30 PM Medical Record Number: [id number] Patient Account Number: [id number] Date of Birth/Sex: Treating RN: [birth year] (71 y.o. [name] Primary Care Brandye Inthavong: PA [name], [place] Other Clinician: Referring Terree Gaultney: Treating Morgin Halls/Extender: [name] in Treatment: [place] reviewed with physician Active Inactive Venous Leg Ulcer Nursing Diagnoses: Knowledge deficit related to disease process and management Potential for venous Insuffiency (use before diagnosis confirmed) Goals: Patient will maintain optimal edema control Date Initiated: 02/17/2022 Target Resolution Date: 06/02/2022 AYCE, PIETRZYK ([id number]) [id number] Page 5 of 11 Goal Status: Active Interventions: Assess peripheral edema status every visit. Compression as ordered Provide education on venous insufficiency Treatment Activities: Non-invasive vascular studies : 02/17/2022 Therapeutic compression applied : 02/17/2022 Notes: Wound/Skin Impairment Nursing Diagnoses: Impaired tissue integrity Knowledge deficit related to ulceration/compromised skin integrity Goals: Patient/caregiver will verbalize understanding of skin care regimen Date Initiated: 02/17/2022 Target Resolution Date: 06/02/2022 Goal Status: Active Ulcer/skin breakdown will have a volume reduction of 30% by week 4 Date Initiated: 02/17/2022 Date Inactivated: 03/10/2022 Target Resolution Date: 03/10/2022 Unmet Reason: infection being treated, Goal Status: Unmet waiting on derm appte Ulcer/skin breakdown will have a volume reduction of 50% by week 8 Date Initiated: 03/10/2022 Date Inactivated: 04/14/2022 Target Resolution Date: 04/07/2022 Goal Status: Unmet Unmet Reason: calcinosis Ulcer/skin breakdown will have a volume reduction of 80% by week 12 Date Initiated: 04/14/2022 Date Inactivated: 05/05/2022 Target Resolution Date: 05/05/2022 Unmet Reason:  infection, chronic Goal Status: Unmet condition Interventions: Assess patient/caregiver ability to obtain necessary supplies Assess patient/caregiver ability to perform ulcer/skin care regimen upon admission and as needed Assess ulceration(s) every visit Provide education on ulcer and skin care Treatment Activities: Skin care regimen initiated : 02/17/2022 Topical wound management initiated : 02/17/2022 Notes: Electronic Signature(s) Signed: 05/19/2022 6:06:53 PM By: Baruch Gouty RN, BSN Entered By: Baruch Gouty on 05/19/2022 13:23:29 -------------------------------------------------------------------------------- Pain Assessment Details Patient Name: Date of Service: MARSHALL, KAMPF 05/19/2022 12:30 PM Medical Record Number: [id number] Patient Account Number: [id number] Date of Birth/Sex: Treating RN: October 30, 1950 (71 y.o. M) Primary Care Vernee Baines: PA Darnelle Spangle Other Clinician: Referring Teyah Rossy: Treating Ashtynn Berke/Extender: [name] in Treatment: 14 Active Problems Location of Pain Severity and Description of Pain Patient Has Paino No Site Locations [place], [place] ([id number]) [id number] Page 6 of 11 Pain Management and Medication Current Pain Management: Electronic Signature(s) Signed: 05/19/2022 2:07:16 PM By: Worthy Rancher Entered By: Worthy Rancher on 05/19/2022 12:58:30 -------------------------------------------------------------------------------- Patient/Caregiver Education Details Patient Name: Date of Service: KHALEB, BROZ 10/25/2023andnbsp12:30 PM Medical Record Number: [id number] Patient Account Number: [id number] Date of Birth/Gender: Treating RN: 1951/03/30 (71 y.o. [name] Primary Care Physician: PA [name], [place] Other Clinician: Referring Physician: Treating Physician/Extender: [name] in Treatment: 14 Education Assessment Education Provided To: Patient Education Topics  Provided Venous: Methods: Explain/Verbal Responses: Reinforcements needed, State content correctly Wound/Skin Impairment: Methods: Explain/Verbal Responses: Reinforcements needed, State content correctly Electronic Signature(s) Signed: 05/19/2022 6:06:53 PM By: Baruch Gouty RN, BSN Entered By: Baruch Gouty on 05/19/2022 13:23:47 -------------------------------------------------------------------------------- Wound Assessment Details Patient Name: Date of Service: [name] 05/19/2022 12:30 PM [name] ([id number]) [id number] Page 7 of 11 Medical Record Number: [id number] Patient Account Number: [id number] Date of Birth/Sex: Treating RN: 1951-02-27 (71 y.o. [name] Primary Care Antonietta Lansdowne: PA [name], [place] Other Clinician: Referring Anil Havard: Treating Jazyah Butsch/Extender: Valeria Batman Weeks in Treatment: 14 Wound  Status Wound Number: 1 Primary Lymphedema Etiology: Wound Location: Left, Dorsal Foot Wound Open Wounding Event: Gradually Appeared Status: Date Acquired: 12/07/2021 Comorbid Cataracts, Anemia, Lymphedema, Congestive Heart Failure, Deep Weeks Of Treatment: 14 History: Vein Thrombosis, Hypertension, Peripheral Venous Disease Clustered Wound: No Photos Wound Measurements Length: (cm) 1.5 Width: (cm) 1.4 Depth: (cm) 0.1 Area: (cm) 1.649 Volume: (cm) 0.165 % Reduction in Area: 56.5% % Reduction in Volume: 89.1% Epithelialization: Small (1-33%) Tunneling: No Undermining: No Wound Description Classification: Full Thickness Without Exposed Support Structures Wound Margin: Distinct, outline attached Exudate Amount: Medium Exudate Type: Serosanguineous Exudate Color: red, brown Foul Odor After Cleansing: No Slough/Fibrino Yes Wound Bed Granulation Amount: Medium (34-66%) Exposed Structure Granulation Quality: Pink Fascia Exposed: No Necrotic Amount: Medium (34-66%) Fat Layer (Subcutaneous Tissue)  Exposed: Yes Necrotic Quality: Adherent Slough Tendon Exposed: No Muscle Exposed: No Joint Exposed: No Bone Exposed: No Periwound Skin Texture Texture Color No Abnormalities Noted: Yes No Abnormalities Noted: Yes Erythema Change: No Change Moisture No Abnormalities Noted: Yes Temperature / Pain Temperature: No Abnormality Tenderness on Palpation: Yes Treatment Notes Wound #1 (Foot) Wound Laterality: Dorsal, Left Cleanser Peri-Wound Care Sween Lotion (Moisturizing lotion) Discharge Instruction: Apply moisturizing lotion as directed Topical Primary Dressing KerraCel Ag Gelling Fiber Dressing, 4x5 in (silver alginate) Discharge Instruction: Apply silver alginate to wound bed as instructed NICHOLAD, KAUTZMAN ([id number]) [id number] Page 8 of 11 Secondary Dressing Woven Gauze Sponge, Non-Sterile 4x4 in Discharge Instruction: Apply over primary dressing as directed. Secured With Compression Wrap ThreePress (3 layer compression wrap) Discharge Instruction: Apply three layer compression as directed. Compression Stockings Add-Ons Electronic Signature(s) Signed: 05/19/2022 6:06:53 PM By: Baruch Gouty RN, BSN Entered By: Baruch Gouty on 05/19/2022 13:17:20 -------------------------------------------------------------------------------- Wound Assessment Details Patient Name: Date of Service: DENY, CHEVEZ 05/19/2022 12:30 PM Medical Record Number: [id number] Patient Account Number: [id number] Date of Birth/Sex: Treating RN: 05-01-1951 (71 y.o. [name] Primary Care Kiev Labrosse: PA [name], [place] Other Clinician: Referring Maricus Tanzi: Treating Vina Byrd/Extender: [name] in Treatment: 14 Wound Status Wound Number: 3 Primary Lymphedema Etiology: Wound Location: Right, Medial Lower Leg Wound Open Wounding Event: Gradually Appeared Status: Date Acquired: 12/07/2021 Comorbid Cataracts, Anemia, Lymphedema, Congestive Heart  Failure, Deep Weeks Of Treatment: 14 History: Vein Thrombosis, Hypertension, Peripheral Venous Disease Clustered Wound: No Photos Wound Measurements Length: (cm) 3.3 Width: (cm) 3.8 Depth: (cm) 0.7 Area: (cm) 9.849 Volume: (cm) 6.894 % Reduction in Area: -84.4% % Reduction in Volume: -222.8% Epithelialization: Small (1-33%) Tunneling: No Undermining: No Wound Description Classification: Full Thickness With Exposed Suppor Wound Margin: Distinct, outline attached Exudate Amount: Large Exudate Type: Purulent Exudate Color: yellow, brown, green t Structures Foul Odor After Cleansing: No Slough/Fibrino Yes Wound Bed Granulation Amount: Medium (34-66%) Exposed IZIAH, CATES ([id number]) [id number] Page 9 of 11 Granulation Quality: Red, Friable Fascia Exposed: No Necrotic Amount: Medium (34-66%) Fat Layer (Subcutaneous Tissue) Exposed: Yes Necrotic Quality: Adherent Slough Tendon Exposed: No Muscle Exposed: No Joint Exposed: No Bone Exposed: No Periwound Skin Texture Texture Color No Abnormalities Noted: Yes No Abnormalities Noted: No Erythema: Yes Moisture Erythema Location: Red Streaks No Abnormalities Noted: Yes Erythema Measurement: Measured Hemosiderin Staining: Yes Temperature / Pain Temperature: Hot Tenderness on Palpation: Yes Treatment Notes Wound #3 (Lower Leg) Wound Laterality: Right, Medial Cleanser Peri-Wound Care Zinc Oxide Ointment 30g tube Discharge Instruction: Apply Zinc Oxide to periwound with each dressing change Sween Lotion (Moisturizing lotion) Discharge Instruction: Apply moisturizing lotion as directed Topical Gentamicin Discharge Instruction: mix with sodium thiosulfate 1:1 sodium thiosulfate ointment Discharge  Instruction: apply to wound bed Primary Dressing Secondary Dressing ABD Pad, 8x10 Discharge Instruction: Apply over primary dressing as directed. Woven Gauze Sponge, Non-Sterile 4x4  in Discharge Instruction: Apply over primary dressing as directed. Secured With Compression Wrap ThreePress (3 layer compression wrap) Discharge Instruction: Apply three layer compression as directed. Compression Stockings Add-Ons Electronic Signature(s) Signed: 05/19/2022 6:06:53 PM By: Baruch Gouty RN, BSN Entered By: Baruch Gouty on 05/19/2022 13:20:27 -------------------------------------------------------------------------------- Wound Assessment Details Patient Name: Date of Service: LAMARI, BECKLES 05/19/2022 12:30 PM Medical Record Number: [id number] Patient Account Number: [id number] Date of Birth/Sex: Treating RN: [birth year] (71 y.o. [name] Primary Care Glenn Gullickson: PA [name], [place] Other Clinician: Referring Giovannie Scerbo: Treating Abygail Galeno/Extender: [name] in Treatment: [address], [place] ([id number]) [id number] Page 10 of 11 Wound Status Wound Number: 6 Primary Calcinosis Etiology: Wound Location: Right, Anterior Lower Leg Wound Open Wounding Event: Gradually Appeared Status: Date Acquired: 05/12/2022 Comorbid Cataracts, Anemia, Lymphedema, Congestive Heart Failure, Deep Weeks Of Treatment: 1 History: Vein Thrombosis, Hypertension, Peripheral Venous Disease Clustered Wound: No Photos Wound Measurements Length: (cm) 0.5 Width: (cm) 0.4 Depth: (cm) 0.1 Area: (cm) 0.157 Volume: (cm) 0.016 % Reduction in Area: 33.5% % Reduction in Volume: 33.3% Epithelialization: None Tunneling: No Undermining: No Wound Description Classification: Full Thickness Without Exposed Suppor Wound Margin: Distinct, outline attached Exudate Amount: Medium Exudate Type: Serosanguineous Exudate Color: red, brown t Structures Foul Odor After Cleansing: No Slough/Fibrino Yes Wound Bed Granulation Amount: Small (1-33%) Exposed Structure Granulation Quality: Red Fascia Exposed: No Necrotic Amount: Large (67-100%) Fat Layer  (Subcutaneous Tissue) Exposed: Yes Necrotic Quality: Adherent Slough Tendon Exposed: No Muscle Exposed: No Joint Exposed: No Bone Exposed: No Periwound Skin Texture Texture Color No Abnormalities Noted: Yes No Abnormalities Noted: No Hemosiderin Staining: Yes Moisture No Abnormalities Noted: Yes Temperature / Pain Temperature: No Abnormality Tenderness on Palpation: Yes Treatment Notes Wound #6 (Lower Leg) Wound Laterality: Right, Anterior Cleanser Peri-Wound Care Zinc Oxide Ointment 30g tube Discharge Instruction: Apply Zinc Oxide to periwound with each dressing change Sween Lotion (Moisturizing lotion) Discharge Instruction: Apply moisturizing lotion as directed Topical Gentamicin Discharge Instruction: mix with sodium thiosulfate 1:1 sodium thiosulfate ointment Discharge Instruction: apply to wound bed Primary Dressing CHASETON, YEPIZ ([id number]) [id number] Page 11 of 11 Secondary Dressing ABD Pad, 8x10 Discharge Instruction: Apply over primary dressing as directed. Woven Gauze Sponge, Non-Sterile 4x4 in Discharge Instruction: Apply over primary dressing as directed. Secured With Compression Wrap ThreePress (3 layer compression wrap) Discharge Instruction: Apply three layer compression as directed. Compression Stockings Add-Ons Electronic Signature(s) Signed: 05/19/2022 6:06:53 PM By: Baruch Gouty RN, BSN Entered By: Baruch Gouty on 05/19/2022 13:20:50 -------------------------------------------------------------------------------- Vitals Details Patient Name: Date of Service: [name] 05/19/2022 12:30 PM Medical Record Number: [id number] Patient Account Number: [id number] Date of Birth/Sex: Treating RN: 1950/11/24 (71 y.o. M) Primary Care Carmon Sahli: PA [name], [place] Other Clinician: Referring Lyra Alaimo: Treating Ennio Houp/Extender: [name] in Treatment: 14 Vital Signs Time Taken: 12:45 Temperature  (F): 97.9 Height (in): 71 Pulse (bpm): 64 Weight (lbs): 267 Respiratory Rate (breaths/min): 20 Body Mass Index (BMI): 37.2 Blood Pressure (mmHg): 132/75 Reference Range: 80 - 120 mg / dl Electronic Signature(s) Signed: 05/19/2022 2:07:16 PM By: Worthy Rancher Entered By: Worthy Rancher on 05/19/2022 12:47:41

## 2022-05-26 ENCOUNTER — Encounter (HOSPITAL_BASED_OUTPATIENT_CLINIC_OR_DEPARTMENT_OTHER): Payer: Medicare Other | Attending: General Surgery | Admitting: General Surgery

## 2022-05-26 DIAGNOSIS — I872 Venous insufficiency (chronic) (peripheral): Secondary | ICD-10-CM | POA: Diagnosis not present

## 2022-05-26 DIAGNOSIS — I890 Lymphedema, not elsewhere classified: Secondary | ICD-10-CM | POA: Diagnosis not present

## 2022-05-26 DIAGNOSIS — I251 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: Secondary | ICD-10-CM | POA: Diagnosis not present

## 2022-05-26 DIAGNOSIS — L97812 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d: Secondary | ICD-10-CM | POA: Insufficient documentation

## 2022-05-26 DIAGNOSIS — I509 Heart failure, unspecified: Secondary | ICD-10-CM | POA: Insufficient documentation

## 2022-05-26 DIAGNOSIS — L942 Calcinosis cutis: Secondary | ICD-10-CM | POA: Diagnosis not present

## 2022-05-26 DIAGNOSIS — I110 Hypertensive heart disease with heart failure: Secondary | ICD-10-CM | POA: Insufficient documentation

## 2022-05-26 DIAGNOSIS — L97522 Non-pressure chronic ulcer of other part of left foot with fat layer exposed: Secondary | ICD-10-CM | POA: Diagnosis not present

## 2022-05-26 DIAGNOSIS — Z86718 Personal history of other venous thrombosis and embolism: Secondary | ICD-10-CM | POA: Diagnosis not present

## 2022-05-31 NOTE — Progress Notes (Signed)
[name] ([id number]) [id number] Page 1 of 9 Visit Report for 05/26/2022 Arrival Information Details Patient Name: Date of Service: [name], [name] 05/26/2022 10:30 A M Medical Record Number: [id number] Patient Account Number: [id number] Date of Birth/Sex: Treating RN: [name] (71 y.o. Male) [name] Primary Care [name]: PA [name], [place] Other Clinician: Referring [name]: Treating [name]/Extender: [name] in Treatment: 15 Visit Information History Since Last Visit Added or deleted any medications: No Patient Arrived: Wheel Chair Any new allergies or adverse reactions: No Arrival Time: 10:43 Had a fall or experienced change in No Accompanied By: self activities of daily living that [name] affect Transfer Assistance: None risk of falls: Patient Requires Transmission-Based Precautions: No Signs or symptoms of abuse/neglect since last visito No Patient Has Alerts: Yes Hospitalized since last visit: No Patient Alerts: R ABI= N/C, TBI= .75 Has Dressing in Place as Prescribed: Yes L ABI =N/C, TBI = .57 Has Compression in Place as Prescribed: Yes Pain Present Now: No Electronic Signature(s) Signed: 05/31/2022 4:40:13 PM By: [name] East RN Entered By: [name] on 05/26/2022 10:58:58 -------------------------------------------------------------------------------- Lower Extremity Assessment Details Patient Name: Date of Service: [name] 05/26/2022 10:30 A M Medical Record Number: [id number] Patient Account Number: [id number] Date of Birth/Sex: Treating RN: [name] (71 y.o. Male) [name] Primary Care Manilla Strieter: PA [name], [place] Other Clinician: Referring [name]: Treating [name]/Extender: [name] Weeks in Treatment: 15 Edema Assessment Assessed: [Left: No] [Right: No] Edema: [Left: Yes] [Right: Yes] Calf Left: Right: Point of Measurement: From Medial Instep 41 cm 44.5  cm Ankle Left: Right: Point of Measurement: From Medial Instep 23 cm 24 cm Vascular Assessment Pulses: Dorsalis Pedis Palpable: [Left:Yes] [Right:Yes] Electronic Signature(sSHAKIR, [name] ([id number]) [id number] Page 2 of 9 Signed: 05/31/2022 4:40:13 PM By: [name] East RN Entered By: [name] on 05/26/2022 11:00:14 -------------------------------------------------------------------------------- Multi Wound Chart Details Patient Name: Date of Service: [name] 05/26/2022 10:30 A M Medical Record Number: [id number] Patient Account Number: [id number] Date of Birth/Sex: Treating RN: [name] (71 y.o. Male) Primary Care Moreen Piggott: PA [name], [place] Other Clinician: Referring [name]: Treating [name]/Extender: [name] Weeks in Treatment: 15 Vital Signs Height(in): 71 Pulse(bpm): 22 Weight(lbs): 67 Blood Pressure(mmHg): 138/65 Body Mass Index(BMI): 37.2 Temperature(F): 97.7 Respiratory Rate(breaths/min): 18 [1:Photos:] Left, Dorsal Foot Right, Medial Lower Leg Right, Anterior Lower Leg Wound Location: Gradually Appeared Gradually Appeared Gradually Appeared Wounding Event: Lymphedema Lymphedema Calcinosis Primary Etiology: Cataracts, Anemia, Lymphedema, Cataracts, Anemia, Lymphedema, Cataracts, Anemia, Lymphedema, Comorbid History: Congestive Heart Failure, Deep Vein Congestive Heart Failure, Deep Vein Congestive Heart Failure, Deep Vein Thrombosis, Hypertension, Peripheral Thrombosis, Hypertension, Peripheral Thrombosis, Hypertension, Peripheral Venous Disease Venous Disease Venous Disease 12/07/2021 12/07/2021 05/12/2022 Date Acquired: '15 15 2 '$ Weeks of Treatment: Open Open Open Wound Status: No No No Wound Recurrence: 1x0.9x0.2 3x3.4x0.2 0.4x0.4x0.1 Measurements L x W x D (cm) 0.707 8.011 0.126 A (cm) : rea 0.141 1.602 0.013 Volume (cm) : 81.40% -50.00% 46.60% % Reduction in A rea: 90.70% 25.00%  45.80% % Reduction in Volume: Full Thickness Without Exposed Full Thickness With Exposed Support Full Thickness Without Exposed Classification: Support Structures Structures Support Structures Medium Large Medium Exudate A mount: Serosanguineous Purulent Serosanguineous Exudate Type: red, brown yellow, brown, green red, brown Exudate Color: Distinct, outline attached Distinct, outline attached Distinct, outline attached Wound Margin: Medium (34-66%) Medium (34-66%) Small (1-33%) Granulation A mount: Pink Red, Friable Red Granulation Quality: Medium (34-66%) Medium (34-66%) Large (67-100%) Necrotic A mount: Fat Layer (Subcutaneous Tissue): Yes Fat Layer (Subcutaneous Tissue):  Yes Fat Layer (Subcutaneous Tissue): Yes Exposed Structures: Fascia: No Fascia: No Fascia: No Tendon: No Tendon: No Tendon: No Muscle: No Muscle: No Muscle: No Joint: No Joint: No Joint: No Bone: No Bone: No Bone: No Small (1-33%) Small (1-33%) None Epithelialization: Debridement - Excisional Debridement - Excisional Debridement - Selective/Open Wound Debridement: Pre-procedure Verification/Time Out 11:18 11:18 11:18 Taken: Lidocaine 4% Topical Solution Lidocaine 4% Topical Solution Lidocaine 4% Topical Solution Pain Control: Subcutaneous, Slough Subcutaneous, [employer] Tissue Debrided: Skin/Subcutaneous Tissue Skin/Subcutaneous Tissue Non-Viable Tissue Level: 0.9 10.2 0.16 Debridement A (sq cm): rea Curette Curette Curette Instrument: Minimum Minimum Minimum Bleeding: Pressure Pressure Pressure Hemostasis A chievedIDREES, [name] ([id number]) [id number] Page 3 of 9 Procedure was tolerated well Procedure was tolerated well Procedure was tolerated well Debridement Treatment Response: 1x0.9x0.2 3x3.4x0.2 0.4x0.4x0.1 Post Debridement Measurements L x W x D (cm) 0.141 1.602 0.013 Post Debridement Volume: (cm) No Abnormalities Noted Excoriation: Yes No  Abnormalities Noted Periwound Skin Texture: No Abnormalities Noted No Abnormalities Noted No Abnormalities Noted Periwound Skin Moisture: Erythema: Yes Erythema: Yes Hemosiderin Staining: Yes Periwound Skin Color: Hemosiderin Staining: Yes N/A Red Streaks N/A Erythema Location: N/A Measured N/A Erythema Measurement: No Change N/A N/A Erythema Change: No Abnormality Hot No Abnormality Temperature: Yes Yes Yes Tenderness on Palpation: Debridement Debridement Debridement Procedures Performed: Treatment Notes Electronic Signature(s) Signed: 05/26/2022 11:38:39 AM By: Fredirick Maudlin MD FACS Entered By: Fredirick Maudlin on 05/26/2022 11:38:39 -------------------------------------------------------------------------------- Multi-Disciplinary Care Plan Details Patient Name: Date of Service: [name] 05/26/2022 10:30 A M Medical Record Number: [id number] Patient Account Number: [id number] Date of Birth/Sex: Treating RN: [name] (71 y.o. Male) [name] Primary Care Alonda Weaber: PA [name], [place] Other Clinician: Referring Jacqulene Huntley: Treating Tricha Ruggirello/Extender: [name] in Treatment: [place] reviewed with physician Active Inactive Venous Leg Ulcer Nursing Diagnoses: Knowledge deficit related to disease process and management Potential for venous Insuffiency (use before diagnosis confirmed) Goals: Patient will maintain optimal edema control Date Initiated: 02/17/2022 Target Resolution Date: 06/02/2022 Goal Status: Active Interventions: Assess peripheral edema status every visit. Compression as ordered Provide education on venous insufficiency Treatment Activities: Non-invasive vascular studies : 02/17/2022 Therapeutic compression applied : 02/17/2022 Notes: Wound/Skin Impairment Nursing Diagnoses: Impaired tissue integrity Knowledge deficit related to ulceration/compromised skin integrity Goals: Patient/caregiver will  verbalize understanding of skin care regimen Date Initiated: 02/17/2022 Target Resolution Date: 06/02/2022 Goal Status: Active Ulcer/skin breakdown will have a volume reduction of 30% by week 4 Khanna, Gerrad ([id number]) [id number] Page 4 of 9 Date Initiated: 02/17/2022 Date Inactivated: 03/10/2022 Target Resolution Date: 03/10/2022 Unmet Reason: infection being treated, Goal Status: Unmet waiting on derm appte Ulcer/skin breakdown will have a volume reduction of 50% by week 8 Date Initiated: 03/10/2022 Date Inactivated: 04/14/2022 Target Resolution Date: 04/07/2022 Goal Status: Unmet Unmet Reason: calcinosis Ulcer/skin breakdown will have a volume reduction of 80% by week 12 Date Initiated: 04/14/2022 Date Inactivated: 05/05/2022 Target Resolution Date: 05/05/2022 Unmet Reason: infection, chronic Goal Status: Unmet condition Interventions: Assess patient/caregiver ability to obtain necessary supplies Assess patient/caregiver ability to perform ulcer/skin care regimen upon admission and as needed Assess ulceration(s) every visit Provide education on ulcer and skin care Treatment Activities: Skin care regimen initiated : 02/17/2022 Topical wound management initiated : 02/17/2022 Notes: Electronic Signature(s) Signed: 05/31/2022 4:40:13 PM By: [name] East RN Entered By: [name] on 05/26/2022 11:06:43 -------------------------------------------------------------------------------- Pain Assessment Details Patient Name: Date of Service: [place], KROTZ 05/26/2022 10:30 A M Medical Record Number: [id number] Patient Account Number: [id number] Date of Birth/Sex:  Treating RN: [birth year] [name] (71 y.o. Male) [name] Primary Care Ary Lavine: PA [name], [place] Other Clinician: Referring Meaghann Choo: Treating Brandy Zuba/Extender: [name] in Treatment: 15 Active Problems Location of Pain Severity and Description of Pain Patient Has Paino No Site  Locations Pain Management and Medication Current Pain Management: Electronic Signature(s) Signed: 05/31/2022 4:40:13 PM By: [name] East RN Entered By: [name] on 05/26/2022 10:59:28 [name] ([id number]) [id number] Page 5 of 9 -------------------------------------------------------------------------------- Patient/Caregiver Education Details Patient Name: Date of Service: [name], [name] 11/1/2023andnbsp10:30 A M Medical Record Number: [id number] Patient Account Number: [id number] Date of Birth/Gender: Treating RN: [name] (71 y.o. Male) [name] Primary Care Physician: PA [name], [place] Other Clinician: Referring Physician: Treating Physician/Extender: [name] in Treatment: 15 Education Assessment Education Provided To: Patient Education Topics Provided Venous: Methods: Explain/Verbal Responses: Reinforcements needed, State content correctly Wound/Skin Impairment: Methods: Explain/Verbal Responses: Reinforcements needed, State content correctly Electronic Signature(s) Signed: 05/31/2022 4:40:13 PM By: [name] East RN Entered By: [name] on 05/26/2022 11:07:02 -------------------------------------------------------------------------------- Wound Assessment Details Patient Name: Date of Service: [name], [name] 05/26/2022 10:30 A M Medical Record Number: [id number] Patient Account Number: [id number] Date of Birth/Sex: Treating RN: [birth year][name] (71 y.o. Male) [name] Primary Care Andrell Tallman: PA [name], [place] Other Clinician: Referring Yesly Gerety: Treating Goro Wenrick/Extender: [name] Weeks in Treatment: 15 Wound Status Wound Number: 1 Primary Lymphedema Etiology: Wound Location: Left, Dorsal Foot Wound Open Wounding Event: Gradually Appeared Status: Date Acquired: 12/07/2021 Comorbid Cataracts, Anemia, Lymphedema, Congestive Heart Failure, Deep Weeks Of Treatment: 15 History: Vein  Thrombosis, Hypertension, Peripheral Venous Disease Clustered Wound: No Photos [name], [name] ([id number]) [id number] Page 6 of 9 Wound Measurements Length: (cm) 1 Width: (cm) 0.9 Depth: (cm) 0.2 Area: (cm) 0.707 Volume: (cm) 0.141 % Reduction in Area: 81.4% % Reduction in Volume: 90.7% Epithelialization: Small (1-33%) Tunneling: No Undermining: No Wound Description Classification: Full Thickness Without Exposed Suppor Wound Margin: Distinct, outline attached Exudate Amount: Medium Exudate Type: Serosanguineous Exudate Color: red, brown t Structures Foul Odor After Cleansing: No Slough/Fibrino Yes Wound Bed Granulation Amount: Medium (34-66%) Exposed Structure Granulation Quality: Pink Fascia Exposed: No Necrotic Amount: Medium (34-66%) Fat Layer (Subcutaneous Tissue) Exposed: Yes Necrotic Quality: Adherent Slough Tendon Exposed: No Muscle Exposed: No Joint Exposed: No Bone Exposed: No Periwound Skin Texture Texture Color No Abnormalities Noted: Yes No Abnormalities Noted: Yes Erythema Change: No Change Moisture No Abnormalities Noted: Yes Temperature / Pain Temperature: No Abnormality Tenderness on Palpation: Yes Electronic Signature(s) Signed: 05/31/2022 4:40:13 PM By: [name] East RN Entered By: [name] on 05/26/2022 11:02:40 -------------------------------------------------------------------------------- Wound Assessment Details Patient Name: Date of Service: [name], [name] 05/26/2022 10:30 A M Medical Record Number: [id number] Patient Account Number: [id number] Date of Birth/Sex: Treating RN: 04[birth year][name] (71 y.o. Male) [name] Primary Care Kylo Gavin: PA [name], [place] Other Clinician: Referring Ezzard Ditmer: Treating [name]/Extender: [name] Weeks in Treatment: 15 Wound Status Wound Number: 3 Primary Lymphedema Etiology: Wound Location: Right, Medial Lower Leg Wound Open Wounding Event: Gradually  Appeared Status: Date Acquired: 12/07/2021 Comorbid Cataracts, Anemia, Lymphedema, Congestive Heart Failure, Deep Weeks Of Treatment: 15 History: Vein Thrombosis, Hypertension, Peripheral Venous Disease Clustered Wound: No Ditto, Brandis ([id number]) [id number] Page 7 of 9 Photos Wound Measurements Length: (cm) 3 Width: (cm) 3.4 Depth: (cm) 0.2 Area: (cm) 8.011 Volume: (cm) 1.602 % Reduction in Area: -50% % Reduction in Volume: 25% Epithelialization: Small (1-33%) Tunneling: No Undermining: No Wound Description Classification: Full Thickness With Exposed Support Structures Wound Margin: Distinct, outline attached  Exudate Amount: Large Exudate Type: Purulent Exudate Color: yellow, brown, green Foul Odor After Cleansing: No Slough/Fibrino Yes Wound Bed Granulation Amount: Medium (34-66%) Exposed Structure Granulation Quality: Red, Friable Fascia Exposed: No Necrotic Amount: Medium (34-66%) Fat Layer (Subcutaneous Tissue) Exposed: Yes Tendon Exposed: No Muscle Exposed: No Joint Exposed: No Bone Exposed: No Periwound Skin Texture Texture Color No Abnormalities Noted: Yes No Abnormalities Noted: No Erythema: Yes Moisture Erythema Location: Red Streaks No Abnormalities Noted: Yes Erythema Measurement: Measured Hemosiderin Staining: Yes Temperature / Pain Temperature: Hot Tenderness on Palpation: Yes Electronic Signature(s) Signed: 05/31/2022 4:40:13 PM By: [name] East RN Entered By: [name] on 05/26/2022 11:23:45 -------------------------------------------------------------------------------- Wound Assessment Details Patient Name: Date of Service: [name], [name] 05/26/2022 10:30 A M Medical Record Number: [id number] Patient Account Number: [id number] Date of Birth/Sex: Treating RN: [birth year] [name] (71 y.o. Male) [name] Primary Care Modelle Vollmer: PA [name], [place] Other Clinician: Referring Elmire Amrein: Treating Hager Compston/Extender: [name] Weeks in Treatment: 15 Wound Status Wound Number: 6 Primary Calcinosis Etiology: Wound Location: Right, Anterior Lower Leg Wound Open Wounding Event: Gradually [name], [name] ([id number]) [id number] Page 8 of 9 Wounding Event: Gradually Appeared Status: Date Acquired: 05/12/2022 Comorbid Cataracts, Anemia, Lymphedema, Congestive Heart Failure, Deep Weeks Of Treatment: 2 History: Vein Thrombosis, Hypertension, Peripheral Venous Disease Clustered Wound: No Photos Wound Measurements Length: (cm) 0.4 Width: (cm) 0.4 Depth: (cm) 0.1 Area: (cm) 0.126 Volume: (cm) 0.013 % Reduction in Area: 46.6% % Reduction in Volume: 45.8% Epithelialization: None Tunneling: No Undermining: No Wound Description Classification: Full Thickness Without Exposed Suppor Wound Margin: Distinct, outline attached Exudate Amount: Medium Exudate Type: Serosanguineous Exudate Color: red, brown t Structures Foul Odor After Cleansing: No Slough/Fibrino Yes Wound Bed Granulation Amount: Small (1-33%) Exposed Structure Granulation Quality: Red Fascia Exposed: No Necrotic Amount: Large (67-100%) Fat Layer (Subcutaneous Tissue) Exposed: Yes Necrotic Quality: Adherent Slough Tendon Exposed: No Muscle Exposed: No Joint Exposed: No Bone Exposed: No Periwound Skin Texture Texture Color No Abnormalities Noted: Yes No Abnormalities Noted: No Hemosiderin Staining: Yes Moisture No Abnormalities Noted: Yes Temperature / Pain Temperature: No Abnormality Tenderness on Palpation: Yes Electronic Signature(s) Signed: 05/31/2022 4:40:13 PM By: [name] East RN Entered By: [name] on 05/26/2022 11:03:54 -------------------------------------------------------------------------------- Vitals Details Patient Name: Date of Service: [name] 05/26/2022 10:30 A M Medical Record Number: [id number] Patient Account Number: [id number] Date of Birth/Sex:  Treating RN: [name]-[birth year] (71 y.o. Male) [name] Primary Care Tangie Stay: PA [name], [place] Other Clinician: Referring Marisel Tostenson: Treating Jabri Blancett/Extender: [name], Johann Capers Weeks in Treatment: 15 Vital Signs Time Taken: 10:59 Temperature (F): 97.7 Wurm, Currie ([id number]) [id number] Page 9 of 9 Height (in): 71 Pulse (bpm): 66 Weight (lbs): 267 Respiratory Rate (breaths/min): 18 Body Mass Index (BMI): 37.2 Blood Pressure (mmHg): 138/65 Reference Range: 80 - 120 mg / dl Electronic Signature(s) Signed: 05/31/2022 4:40:13 PM By: [name] East RN Entered By: [name] on 05/26/2022 10:59:23

## 2022-05-31 NOTE — Progress Notes (Signed)
[name] ([id number]) [id number] Page 1 of 14 Visit Report for 05/26/2022 Chief Complaint Document Details Patient Name: Date of Service: [name], [name] 05/26/2022 10:30 A M Medical Record Number: [id number] Patient Account Number: [id number] Date of Birth/Sex: Treating RN: 28-Oct-1950 (71 y.o. Male) Primary Care Provider: PA [name], [place] Other Clinician: Referring Provider: Treating Provider/Extender: [name] in Treatment: 15 Information Obtained from: Patient Chief Complaint Patient seen for complaints of Non-Healing Wounds. Electronic Signature(s) Signed: 05/26/2022 11:38:46 AM By: Fredirick Maudlin MD FACS Entered By: Fredirick Maudlin on 05/26/2022 11:38:46 -------------------------------------------------------------------------------- Debridement Details Patient Name: Date of Service: [name] 05/26/2022 10:30 A M Medical Record Number: [id number] Patient Account Number: [id number] Date of Birth/Sex: Treating RN: 1951/05/23 (71 y.o. Male) Blanche East Primary Care Provider: PA [name], [place] Other Clinician: Referring Provider: Treating Provider/Extender: [name] in Treatment: 15 Debridement Performed for Assessment: Wound #3 Right,Medial Lower Leg Performed By: Physician Fredirick Maudlin, MD Debridement Type: Debridement Level of Consciousness (Pre-procedure): Awake and Alert Pre-procedure Verification/Time Out Yes - 11:18 Taken: Start Time: 11:19 Pain Control: Lidocaine 4% T opical Solution T Area Debrided (L x W): otal 3 (cm) x 3.4 (cm) = 10.2 (cm) Tissue and other material debrided: Viable, Non-Viable, Slough, Subcutaneous, Slough Level: Skin/Subcutaneous Tissue Debridement Description: Excisional Instrument: Curette Bleeding: Minimum Hemostasis Achieved: Pressure Response to Treatment: Procedure was tolerated well Level of Consciousness (Post- Awake and Alert procedure): Post  Debridement Measurements of Total Wound Length: (cm) 3 Width: (cm) 3.4 Depth: (cm) 0.2 Volume: (cm) 1.602 Character of Wound/Ulcer Post Debridement: Requires Further Debridement Post Procedure Diagnosis Same as QUANTE, PETTRY ([id number]) [id number] Page 2 of 14 Notes Scribed for Dr. [name] by Blanche East, RN Electronic Signature(s) Signed: 05/26/2022 12:41:04 PM By: Fredirick Maudlin MD FACS Signed: 05/31/2022 4:40:13 PM By: Blanche East RN Entered By: Blanche East on 05/26/2022 11:21:25 -------------------------------------------------------------------------------- Debridement Details Patient Name: Date of Service: [name] 05/26/2022 10:30 A M Medical Record Number: [id number] Patient Account Number: [id number] Date of Birth/Sex: Treating RN: 1950/10/16 (71 y.o. Male) Blanche East Primary Care Provider: PA [name], [place] Other Clinician: Referring Provider: Treating Provider/Extender: [name] in Treatment: 15 Debridement Performed for Assessment: Wound #6 Right,Anterior Lower Leg Performed By: Physician Fredirick Maudlin, MD Debridement Type: Debridement Level of Consciousness (Pre-procedure): Awake and Alert Pre-procedure Verification/Time Out Yes - 11:18 Taken: Start Time: 11:19 Pain Control: Lidocaine 4% T opical Solution T Area Debrided (L x W): otal 0.4 (cm) x 0.4 (cm) = 0.16 (cm) Tissue and other material debrided: Non-Viable, [name] Level: Non-Viable Tissue Debridement Description: Selective/Open Wound Instrument: Curette Bleeding: Minimum Hemostasis Achieved: Pressure Response to Treatment: Procedure was tolerated well Level of Consciousness (Post- Awake and Alert procedure): Post Debridement Measurements of Total Wound Length: (cm) 0.4 Width: (cm) 0.4 Depth: (cm) 0.1 Volume: (cm) 0.013 Character of Wound/Ulcer Post Debridement: Requires Further Debridement Post Procedure  Diagnosis Same as Pre-procedure Notes Scribed for Dr. [name] by Blanche East, RN Electronic Signature(s) Signed: 05/26/2022 12:41:04 PM By: Fredirick Maudlin MD FACS Signed: 05/31/2022 4:40:13 PM By: Blanche East RN Entered By: Blanche East on 05/26/2022 11:22:51 -------------------------------------------------------------------------------- Debridement Details Patient Name: Date of Service: [name] 05/26/2022 10:30 A M Medical Record Number: [id number] Patient Account Number: [id number] Date of Birth/Sex: Treating RN: 1950-08-29 (71 y.o. Male) Willey Blade, [facility] Primary Care Provider: PA Darnelle Spangle Other ClinicianColletta Maryland ([id number]) [id number] Page 3 of 14 Referring Provider: Treating Provider/Extender: Valeria Batman Weeks in Treatment: 15 Debridement  Performed for Assessment: Wound #1 Left,Dorsal Foot Performed By: Physician Fredirick Maudlin, MD Debridement Type: Debridement Level of Consciousness (Pre-procedure): Awake and Alert Pre-procedure Verification/Time Out Yes - 11:18 Taken: Start Time: 11:19 Pain Control: Lidocaine 4% T opical Solution T Area Debrided (L x W): otal 1 (cm) x 0.9 (cm) = 0.9 (cm) Tissue and other material debrided: Viable, Non-Viable, Slough, Subcutaneous, Slough Level: Skin/Subcutaneous Tissue Debridement Description: Excisional Instrument: Curette Bleeding: Minimum Hemostasis Achieved: Pressure Response to Treatment: Procedure was tolerated well Level of Consciousness (Post- Awake and Alert procedure): Post Debridement Measurements of Total Wound Length: (cm) 1 Width: (cm) 0.9 Depth: (cm) 0.2 Volume: (cm) 0.141 Character of Wound/Ulcer Post Debridement: Requires Further Debridement Post Procedure Diagnosis Same as Pre-procedure Notes Scribed for Dr. [name] by Blanche East, RN Electronic Signature(s) Signed: 05/26/2022 12:41:04 PM By: Fredirick Maudlin MD FACS Signed: 05/31/2022 4:40:13 PM  By: Blanche East RN Entered By: Blanche East on 05/26/2022 11:23:25 -------------------------------------------------------------------------------- HPI Details Patient Name: Date of Service: [name] 05/26/2022 10:30 A M Medical Record Number: [id number] Patient Account Number: [id number] Date of Birth/Sex: Treating RN: 1950-08-20 (71 y.o. Male) Primary Care Provider: PA [name], [place] Other Clinician: Referring Provider: Treating Provider/Extender: [name], [name] in Treatment: 15 History of Present Illness HPI Description: ADMISSION 02/09/2022 This is a 71 year old man who recently moved to [place] from [place]. He has a history of of coronary artery disease and prior left popliteal vein DVT . He is not diabetic and does not smoke. 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 He has had multiple spinal surgeries and has limited mobility. He primarily uses a motorized wheelchair. He has had lymphedema for many years and does have lymphedema pumps although he says he does not use them frequently. He has wounds on his bilateral lower extremities. He was receiving care in [place] for these prior to his move. They were apparently packing his wounds with Betadine soaked gauze. He has worn compression wraps in the past but not recently. He did say that they helped tremendously. In clinic today, his right ABI is noncompressible but has good signals; the left ABI was 1.17. No formal vascular studies have been performed. On his left dorsal foot, there is a circular lesion that exposes the fat layer. There is fairly heavy slough accumulation within the wound, but no significant odor. On his right medial calf, he has multiple pinholes that have slough buildup in them and probe for about 2 to 4 mm in depth. They are draining clear fluid. On his right lateral midfoot, he has ulceration consistent with venous stasis. It is geographic and has slough accumulation. He  has changes in his lower extremities consistent with stasis dermatitis, but no hemosiderin deposition or thickening of the skin. 02/17/2022: All of the wounds are little bit larger today and have more slough accumulation. Today, the medial calf openings are larger and probe to something that feels calcified. There is odor coming from his wounds. His vascular studies are scheduled for August 9 and 10. 02-24-2022 upon evaluation today patient presents for follow-up concerning his ongoing issues here with his lower extremities. With that being said he does have significant problems with what appears to be cellulitis unfortunately the PCR culture that was sent last week got crushed by [employer] in transit. With that being said GUIDO, COMP ([id number]) [id number] Page 4 of 14 we are going to reobtain a culture today although I am actually to do it on the right medial  leg as this seems to be the most inflamed area today where there is some questionable drainage as well. I am going to remove some of the calcium deposits which are loosening obtain a deeper culture from this area. Fortunately I do not see any evidence of active infection systemically which is good news but locally I think we are having a bigger issue here. He does have his appointment next Thursday with vascular. Patient has also been referred to [facility] for further evaluation and treatment of the calcinosis for possible sulfate injections. 03/04/2022: The culture that was performed last week grew out multiple species including Klebsiella, Morganella, and Pseudomonas aeruginosa. He had been prescribed Bactrim but this was not adequate to cover all of the species and levofloxacin was recommended. He completed the Bactrim but did not pick up the levofloxacin and begin taking it until yesterday. 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 He had arterial studies performed today. He does have evidence of peripheral arterial disease. Results copied here: +-------+-----------+-----------+------------+------------+ ABI/TBIT oday's ABIT oday's TBIPrevious ABIPrevious TBI +-------+-----------+-----------+------------+------------+ Right [place] 0.75    +-------+-----------+-----------+------------+------------+ Left [place] 0.57    +-------+-----------+-----------+------------+------------+ Arterial wall calcification precludes accurate ankle pressures and ABIs. Summary: Right: Resting right ankle-brachial index indicates noncompressible right lower extremity arteries. The right toe-brachial index is normal. Left: Resting left ankle-brachial index indicates noncompressible left lower extremity arteries. The left toe-brachial index is abnormal. He continues to have further tissue breakdown at the right medial calf and there is ample slough accumulation along with necrotic subcutaneous tissue. The left dorsal foot wound has built up slough, as well. The right medial foot wound is nearly closed with just a light layer of eschar over the surface. The right dorsal lateral foot wound has also accumulated a fair amount of slough and remains quite tender. 03/10/2022: He has been taking the prescribed levofloxacin and has about 3 days left of this. The erythema and induration on his right leg has improved. He continues to experience further breakdown of the right medial calf wound. There is extensive slough and debris accumulation there. There is heavy slough on his left dorsal foot wound, but no odor or purulent drainage. The right medial foot wound appears to be closed. The right dorsal lateral foot wound also has a fair amount of slough but it is less tender than at our last visit. He still does not have an appointment with dermatology. 03/22/2022: The  right anterior tibial wound has closed. The right lateral foot wound is nearly closed with just a little bit of slough. The left dorsal foot wound is smaller and continues to have some slough buildup but less so than at prior visits. There is good granulation tissue underlying the slough. Unfortunately the right medial calf wound continues to deteriorate. It has a foul odor today and a lot of necrotic tissue, the surrounding skin is also erythematous and moist. 03/30/2022: The right lateral foot wound continues to contract and just has a little bit of slough and eschar present. The left dorsal foot wound is also smaller with slough overlying good granulation tissue. The right medial calf wound actually looks quite a bit better today; there is no odor and there is much less necrotic tissue although some remains present. We have been using topical gentamicin and he has been taking oral Augmentin. 04/07/2022: The patient saw dermatology at  [facility] last week. Unfortunately, it appears that they did not read any of the documentation that was provided. 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 Overall, it was a highly unsatisfactory consultation. In the meantime, however, we were able to identify compounding pharmacy who could create a topical sodium thiosulfate ointment. The patient has that with him today. Both of the right foot wounds are now closed. The left dorsal foot wound has contracted but still has a layer of slough on the surface. The medial calf wounds on the right all look a little bit better. They still have heavy slough along with the chunks of calcium. Everything is stained purple. 04/14/2022: The wound on his dorsal left foot  is a little bit smaller again today. There is still slough accumulation on the surface. The medial calf wounds have quite a bit less slough accumulation today. His right leg, however, is warm and erythematous, concerning for cellulitis. 04/14/2022: He completed his course of Augmentin yesterday. The medial calf wound sites are much cleaner today and shallower. There is less fragmented calcium in the wounds. He has a lot of lymphedema fluid-like drainage from these wounds, but no purulent discharge or malodor. The wound on his dorsal left foot has also contracted and is shallower. There is a layer of slough over healthy granulation tissue. 05/05/2022: The left dorsal foot wound is smaller today with less slough and more granulation tissue. The right medial calf wounds are shallower, but have extensive slough and some fat necrosis. The drainage has diminished considerably, however. 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 05/12/2022: The left dorsal foot wound continues to contract and fill with granulation tissue. There is slough on the wound surface. The right medial calf wounds also continue to fill with healthy tissue, but there is remaining slough and fat necrosis present. He had a significant increase in his drainage this week and it was a bright yellow-green color. He also had a new wound open over his right anterior tibial surface associated with the spike of cutaneous calcium. The leg is more red, as well, concerning for infection. 05/19/2022: His culture returned positive for Pseudomonas. He is currently taking a course of oral levofloxacin. We have also been using topical gentamicin mixed in with his sodium thiosulfate. All of the wounds look better this week. 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 There is slough and nonviable subcutaneous  tissue still present. The new anterior tibial wound just has a light layer of slough. The dorsal foot wound also has some slough present and appears a little dry today. 05/26/2022: The right medial leg wound continues to improve. It is feeling with granulation tissue, but still accumulates a fairly thick layer of slough on the surface. The more recent anterior tibial wound has remained quite small, but also has some slough on the surface. The left dorsal foot wound has contracted somewhat and has perimeter epithelialization. He completed his oral levofloxacin. Electronic Signature(s) Signed: 05/26/2022 11:39:44 AM By: Fredirick Maudlin MD FACS Entered By: Fredirick Maudlin on 05/26/2022 11:39:44 [name] ([id number]) [id number] Page 5 of 14 -------------------------------------------------------------------------------- Physical Exam Details Patient  Name: Date of Service: AZIAH, BROSTROM 05/26/2022 10:30 A M Medical Record Number: [id number] Patient Account Number: [id number] Date of Birth/Sex: Treating RN: 04-03-1951 (71 y.o. Male) Primary Care Provider: PA [name], [place] Other Clinician: Referring Provider: Treating Provider/Extender: [name], Johann Capers Weeks in Treatment: 15 Constitutional . . . . No acute distress.Marland Kitchen Respiratory Normal work of breathing on room air.. Notes 05/26/2022: The right medial leg wound continues to improve. It is filling with granulation tissue, but still accumulates a fairly thick layer of slough on the surface. The more recent anterior tibial wound has remained quite small, but also has some slough on the surface. The left dorsal foot wound has contracted somewhat and has perimeter epithelialization. Electronic Signature(s) Signed: 05/26/2022 11:41:28 AM By: Fredirick Maudlin MD FACS Entered By: Fredirick Maudlin on 05/26/2022 11:41:28 -------------------------------------------------------------------------------- Physician  Orders Details Patient Name: Date of Service: [name] 05/26/2022 10:30 A M Medical Record Number: [id number] Patient Account Number: [id number] Date of Birth/Sex: Treating RN: 23-Aug-1950 (71 y.o. Male) Blanche East Primary Care Provider: PA [name], [place] Other Clinician: Referring Provider: Treating Provider/Extender: [name] in Treatment: 15 Verbal / Phone Orders: No Diagnosis Coding ICD-10 Coding Code Description [phone] 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d L97.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) I10 Essential (primary) hypertension I25.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 - Dr. [name] Rm 1 Return A Anesthetic Wound #1 Left,Dorsal Foot (In clinic) Topical Lidocaine 5% applied to wound bed (In clinic) Topical Lidocaine 4% applied to wound bed Wound #3 Right,Medial Lower Leg (In clinic) Topical Lidocaine 5% applied to wound bed (In clinic) Topical Lidocaine 4% applied to wound bed Acocella, [name] ([id number]) [id number] Page 6 of 14 Wound #6 Right,Anterior Lower Leg (In clinic) Topical Lidocaine 5% applied to wound bed (In clinic) Topical Lidocaine 4% applied to wound bed Bathing/ Shower/ Hygiene May shower with protection but do not get wound dressing(s) wet. Edema Control - Lymphedema / SCD / Other Lymphedema Pumps. Use Lymphedema pumps on leg(s) 2-3 times a day for 45-60 minutes. If wearing any wraps or hose, do not remove them. Continue exercising as instructed. - use 1-2 times per day over wraps Elevate legs to the level of the heart or above for 30 minutes  daily and/or when sitting, a frequency of: Exercise regularly Wound Treatment Wound #1 - Foot Wound Laterality: Dorsal, Left Peri-Wound Care: Sween Lotion (Moisturizing lotion) 1 x Per Week Discharge Instructions: Apply moisturizing lotion as directed Prim Dressing: KerraCel Ag Gelling Fiber Dressing, 4x5 in (silver alginate) 1 x Per Week ary Discharge Instructions: Apply silver alginate to wound bed as instructed Secondary Dressing: Woven Gauze Sponge, Non-Sterile 4x4 in 1 x Per Week Discharge Instructions: Apply over primary dressing as directed. Compression Wrap: ThreePress (3 layer compression wrap) 1 x Per Week Discharge Instructions: Apply three layer compression as directed. Wound #3 - Lower Leg Wound Laterality: Right, Medial Peri-Wound Care: Zinc Oxide Ointment 30g tube 1 x Per Week Discharge Instructions: Apply Zinc Oxide to periwound with each dressing change Peri-Wound Care: Sween Lotion (Moisturizing lotion) 1 x Per Week Discharge Instructions: Apply moisturizing lotion as directed Topical: Gentamicin 1 x Per Week Discharge Instructions: mix with sodium thiosulfate 1:1 Topical: sodium thiosulfate  ointment 1 x Per Week Discharge Instructions: apply to wound bed Secondary Dressing: ABD Pad, 8x10 1 x Per Week Discharge Instructions: Apply over primary dressing as directed. Secondary Dressing: Woven Gauze Sponge, Non-Sterile 4x4 in 1 x Per Week Discharge Instructions: Apply over primary dressing as directed. Compression Wrap: ThreePress (3 layer compression wrap) 1 x Per Week Discharge Instructions: Apply three layer compression as directed. Wound #6 - Lower Leg Wound Laterality: Right, Anterior Peri-Wound Care: Zinc Oxide Ointment 30g tube 1 x Per Week Discharge Instructions: Apply Zinc Oxide to periwound with each dressing change Peri-Wound Care: Sween Lotion (Moisturizing lotion) 1 x Per Week Discharge Instructions: Apply moisturizing lotion as directed Topical:  Gentamicin 1 x Per Week Discharge Instructions: mix with sodium thiosulfate 1:1 Topical: sodium thiosulfate ointment 1 x Per Week Discharge Instructions: apply to wound bed Secondary Dressing: ABD Pad, 8x10 1 x Per Week Discharge Instructions: Apply over primary dressing as directed. Secondary Dressing: Woven Gauze Sponge, Non-Sterile 4x4 in 1 x Per Week Discharge Instructions: Apply over primary dressing as directed. Compression Wrap: ThreePress (3 layer compression wrap) 1 x Per Week Discharge Instructions: Apply three layer compression as directed. Electronic Signature(s) Signed: 05/26/2022 12:41:04 PM By: Fredirick Maudlin MD FACS Entered By: Fredirick Maudlin on 05/26/2022 11:42:27 [name] ([id number]) [id number] Page 7 of 14 -------------------------------------------------------------------------------- Problem List Details Patient Name: Date of Service: CELSO, GRANJA 05/26/2022 10:30 A M Medical Record Number: [id number] Patient Account Number: [id number] Date of Birth/Sex: Treating RN: 08-Dec-1950 (71 y.o. Male) Primary Care Provider: PA [name], [place] Other Clinician: Referring Provider: Treating Provider/Extender: [name] in Treatment: 15 Active Problems ICD-10 Encounter Code Description Active Date MDM Diagnosis L97.812 Non-pressure chronic ulcer of other part of right lower leg with fat layer 02/09/2022 No Yes exposed L97.522 Non-pressure chronic ulcer of other part of left foot with fat layer exposed 02/09/2022 No Yes 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 02/09/2022 No Yes 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) 02/09/2022 No Yes I10 Essential (primary) hypertension 02/09/2022 No Yes I25.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 02/09/2022 No Yes I89.0 Lymphedema, not elsewhere classified 02/09/2022 No Yes Z86.718 Personal history of other venous thrombosis and embolism 02/09/2022 No  Yes Inactive Problems ICD-10 Code Description Active Date Inactive Date L97.512 Non-pressure chronic ulcer of other part of right foot with fat layer exposed 02/17/2022 02/17/2022 Resolved Problems Electronic Signature(s) Signed: 05/26/2022 11:37:09 AM By: Fredirick Maudlin MD FACS Entered By: Fredirick Maudlin on 05/26/2022 11:37:08 [name] ([id number]) [id number] Page 8 of 14 -------------------------------------------------------------------------------- Progress Note Details Patient Name: Date of Service: JACHIN, COURY 05/26/2022 10:30 A M Medical Record Number: [id number] Patient Account Number: [id number] Date of Birth/Sex: Treating RN: Dec 03, 1950 (71 y.o. Male) Primary Care Provider: PA [name], [place] Other Clinician: Referring Provider: Treating Provider/Extender: [name] in Treatment: 15 Subjective Chief Complaint Information obtained from Patient Patient seen for complaints of Non-Healing Wounds. History of Present Illness (HPI) ADMISSION 02/09/2022 This is a 71 year old man who recently moved to [place] from [place]. He has a history of of coronary artery disease and prior left popliteal vein DVT . He is not diabetic and does not smoke. 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 He has had multiple spinal surgeries and has limited mobility. He primarily uses a motorized wheelchair. He has had lymphedema for many years and does have lymphedema pumps although he says he does not use them frequently. He has wounds on his bilateral lower extremities. He  was receiving care in [place] for these prior to his move. They were apparently packing his wounds with Betadine soaked gauze. He has worn compression wraps in the past but not recently. He did say that they helped tremendously. In clinic today, his right ABI is noncompressible but has good signals; the left ABI was 1.17. No formal vascular studies have been  performed. On his left dorsal foot, there is a circular lesion that exposes the fat layer. There is fairly heavy slough accumulation within the wound, but no significant odor. On his right medial calf, he has multiple pinholes that have slough buildup in them and probe for about 2 to 4 mm in depth. They are draining clear fluid. On his right lateral midfoot, he has ulceration consistent with venous stasis. It is geographic and has slough accumulation. 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 02/17/2022: All of the wounds are little bit larger today and have more slough accumulation. Today, the medial calf openings are larger and probe to something that feels calcified. There is odor coming from his wounds. His vascular studies are scheduled for August 9 and 10. 02-24-2022 upon evaluation today patient presents for follow-up concerning his ongoing issues here with his lower extremities. With that being said he does have significant problems with what appears to be cellulitis unfortunately the PCR culture that was sent last week got crushed by [employer] in transit. With that being said we are going to reobtain a culture today although I am actually to do it on the right medial leg as this seems to be the most inflamed area today where there is some questionable drainage as well. I am going to remove some of the calcium deposits which are loosening obtain a deeper culture from this area. Fortunately I do not see any evidence of active infection systemically which is good news but locally I think we are having a bigger issue here. He does have his appointment next Thursday with vascular. Patient has also been referred to [facility] for further evaluation and treatment of the calcinosis for possible sulfate injections. 03/04/2022: The culture that was performed last week grew out multiple species including Klebsiella,  Morganella, and Pseudomonas aeruginosa. He had been prescribed Bactrim but this was not adequate to cover all of the species and levofloxacin was recommended. He completed the Bactrim but did not pick up the levofloxacin and begin taking it until yesterday. 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 He had arterial studies performed today. He does have evidence of peripheral arterial disease. Results copied here: +-------+-----------+-----------+------------+------------+ ABI/TBIT oday's ABIT oday's TBIPrevious ABIPrevious TBI +-------+-----------+-----------+------------+------------+ Right [place] 0.75    +-------+-----------+-----------+------------+------------+ Left  0.57    +-------+-----------+-----------+------------+------------+ Arterial wall calcification precludes accurate ankle pressures and ABIs. Summary: Right: Resting right ankle-brachial index indicates noncompressible right lower extremity arteries. The right toe-brachial index is normal. Left: Resting left ankle-brachial index indicates noncompressible left lower extremity arteries. The left toe-brachial index is abnormal. He continues to have further tissue breakdown at the right medial calf and there is ample slough accumulation along with necrotic subcutaneous tissue. The left dorsal foot wound has built up slough, as well. The right medial foot wound is nearly closed with just a light layer of eschar over the surface. The right dorsal lateral foot wound  has also accumulated a fair amount of slough and remains quite tender. 03/10/2022: He has been taking the prescribed levofloxacin and has about 3 days left of this. The erythema and induration on his right leg has improved. He continues to experience further breakdown of the right medial calf  wound. There is extensive slough and debris accumulation there. There is heavy slough on his left dorsal foot wound, but no odor or purulent drainage. The right medial foot wound appears to be closed. The right dorsal lateral foot wound also has a fair amount of slough but it is less tender than at our last visit. He still does not have an appointment with dermatology. 03/22/2022: The right anterior tibial wound has closed. The right lateral foot wound is nearly closed with just a little bit of slough. The left dorsal foot wound is smaller and continues to have some slough buildup but less so than at prior visits. There is good granulation tissue underlying the slough. Unfortunately the right medial calf wound continues to deteriorate. It has a foul odor today and a lot of necrotic tissue, the surrounding skin is also erythematous and moist. JAHMAD, PETRICH ([id number]) [id number] Page 9 of 14 03/30/2022: The right lateral foot wound continues to contract and just has a little bit of slough and eschar present. The left dorsal foot wound is also smaller with slough overlying good granulation tissue. The right medial calf wound actually looks quite a bit better today; there is no odor and there is much less necrotic tissue although some remains present. We have been using topical gentamicin and he has been taking oral Augmentin. 04/07/2022: The patient saw dermatology at [facility] last week. Unfortunately, it appears that they did not read any of the documentation that was provided. 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 Overall, it was a highly unsatisfactory consultation. In the meantime, however, we were able  to identify compounding pharmacy who could create a topical sodium thiosulfate ointment. The patient has that with him today. Both of the right foot wounds are now closed. The left dorsal foot wound has contracted but still has a layer of slough on the surface. The medial calf wounds on the right all look a little bit better. They still have heavy slough along with the chunks of calcium. Everything is stained purple. 04/14/2022: The wound on his dorsal left foot is a little bit smaller again today. There is still slough accumulation on the surface. The medial calf wounds have quite a bit less slough accumulation today. His right leg, however, is warm and erythematous, concerning for cellulitis. 04/14/2022: He completed his course of Augmentin yesterday. The medial calf wound sites are much cleaner today and shallower. There is less fragmented calcium in the wounds. He has a lot of lymphedema fluid-like drainage from these wounds, but no purulent discharge or malodor. The wound on his dorsal left foot has also contracted and is shallower. There is a layer of slough over healthy granulation tissue. 05/05/2022: The left dorsal foot wound is smaller today with less slough and more granulation tissue. The right medial calf wounds are shallower, but have extensive slough and some fat necrosis. The drainage has diminished considerably, however. 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 05/12/2022: The left dorsal foot wound continues to contract and fill with granulation tissue. There is slough on the wound surface. The right medial calf wounds also continue to fill with healthy tissue, but there is remaining slough and fat necrosis present. He had a significant increase in his drainage this week and it was a bright yellow-green color. He also had a new wound open over his right anterior tibial  surface associated with the spike of cutaneous calcium. The leg is more red, as well, concerning for infection. 05/19/2022: His culture returned positive for Pseudomonas. He is currently taking a course of oral levofloxacin. We have also been using topical gentamicin mixed in with his sodium thiosulfate. All of the wounds look better this week. 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 There is slough and nonviable subcutaneous tissue still present. The new anterior tibial wound just has a light layer of slough. The dorsal foot wound also has some slough present and appears a little dry today. 05/26/2022: The right medial leg wound continues to improve. It is feeling with granulation tissue, but still accumulates a fairly thick layer of slough on the surface. The more recent anterior tibial wound has remained quite small, but also has some slough on the surface. The left dorsal foot wound has contracted somewhat and has perimeter epithelialization. He completed his oral levofloxacin. Patient History Information obtained from Patient, Chart. Family History Cancer - Mother, Heart Disease - Father,Mother, Hypertension - Father,Siblings, Lung Disease - Siblings, No family history of Diabetes, Hereditary Spherocytosis, Kidney Disease, Seizures, Stroke, Thyroid Problems, Tuberculosis. Social History Former smoker - quit 1991, Marital Status - Married, Alcohol Use - Moderate, Drug Use - No History, Caffeine Use - Daily - coffee. Medical History Eyes Patient has history of Cataracts - right eye removed Denies history of Glaucoma, Optic Neuritis Ear/Nose/Mouth/Throat Denies history of Chronic sinus problems/congestion, Middle ear problems Hematologic/Lymphatic Patient has history of Anemia - macrocytic, Lymphedema Cardiovascular Patient has history of Congestive Heart Failure, Deep Vein Thrombosis - left leg, Hypertension, Peripheral Venous Disease Endocrine Denies  history of Type I Diabetes, Type II Diabetes Genitourinary Denies history of End Stage Renal Disease Integumentary (Skin) Denies history of History of Burn Oncologic Denies history of Received Chemotherapy, Received Radiation Psychiatric Denies history of Anorexia/bulimia, Confinement Anxiety Hospitalization/Surgery History - cervical fusion. - lumbar surgery. - coronary stent placement. - lasik eye surgery. - hydrocele excision. Medical A Surgical History Notes nd Constitutional Symptoms (General Health) obesity Ear/Nose/Mouth/Throat hard of hearing Respiratory frozen diaphragm right Cardiovascular hyperlipidemia Musculoskeletal myositis, lumbar DDD, spinal stenosis, cervical facet joint syndrome WOODWARD, KLEM ([id number]) [id number] Page 10 of 14 Objective Constitutional No acute distress.. Vitals Time Taken: 10:59 AM, Height: 71 in, Weight: 267 lbs, BMI: 37.2, Temperature: 97.7 F, Pulse: 66 bpm, Respiratory Rate: 18 breaths/min, Blood Pressure: 138/65 mmHg. Respiratory Normal work of breathing on room air.. General Notes: 05/26/2022: The right medial leg wound continues to improve. It is filling with granulation tissue, but still accumulates a fairly thick layer of slough on the surface. The more recent anterior tibial wound has remained quite small, but also has some slough on the surface. The left dorsal foot wound has contracted somewhat and has perimeter epithelialization. Integumentary (Hair, Skin) Wound #1 status is Open. Original cause of wound was Gradually Appeared. The date acquired was: 12/07/2021. The wound has been in treatment 15 weeks. The  wound is located on the Left,Dorsal Foot. The wound measures 1cm length x 0.9cm width x 0.2cm depth; 0.707cm^2 area and 0.141cm^3 volume. There is Fat Layer (Subcutaneous Tissue) exposed. There is no tunneling or undermining noted. There is a medium amount of serosanguineous drainage noted. The wound  margin is distinct with the outline attached to the wound base. There is medium (34-66%) pink granulation within the wound bed. There is a medium (34- 66%) amount of necrotic tissue within the wound bed including Adherent Slough. The periwound skin appearance had no abnormalities noted for texture. The periwound skin appearance had no abnormalities noted for moisture. The periwound skin appearance had no abnormalities noted for color. Periwound temperature was noted as No Abnormality. The periwound has tenderness on palpation. Wound #3 status is Open. Original cause of wound was Gradually Appeared. The date acquired was: 12/07/2021. The wound has been in treatment 15 weeks. The wound is located on the Right,Medial Lower Leg. The wound measures 3cm length x 3.4cm width x 0.2cm depth; 8.011cm^2 area and 1.602cm^3 volume. There is Fat Layer (Subcutaneous Tissue) exposed. There is no tunneling or undermining noted. There is a large amount of purulent drainage noted. The wound margin is distinct with the outline attached to the wound base. There is medium (34-66%) red, friable granulation within the wound bed. There is a medium (34- 66%) amount of necrotic tissue within the wound bed. The periwound skin appearance had no abnormalities noted for texture. The periwound skin appearance had no abnormalities noted for moisture. The periwound skin appearance exhibited: Hemosiderin Staining, Erythema. The surrounding wound skin color is noted with erythema with red streaks. Erythema is measured. Periwound temperature was noted as Hot. The periwound has tenderness on palpation. Wound #6 status is Open. Original cause of wound was Gradually Appeared. The date acquired was: 05/12/2022. The wound has been in treatment 2 weeks. The wound is located on the Right,Anterior Lower Leg. The wound measures 0.4cm length x 0.4cm width x 0.1cm depth; 0.126cm^2 area and 0.013cm^3 volume. There is Fat Layer (Subcutaneous Tissue)  exposed. There is no tunneling or undermining noted. There is a medium amount of serosanguineous drainage noted. The wound margin is distinct with the outline attached to the wound base. There is small (1-33%) red granulation within the wound bed. There is a large (67-100%) amount of necrotic tissue within the wound bed including Adherent Slough. The periwound skin appearance had no abnormalities noted for texture. The periwound skin appearance had no abnormalities noted for moisture. The periwound skin appearance exhibited: Hemosiderin Staining. Periwound temperature was noted as No Abnormality. The periwound has tenderness on palpation. 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 There was a Excisional Skin/Subcutaneous Tissue Debridement with a total area of 0.9 sq cm performed by Fredirick Maudlin, MD. With the following instrument(s): Curette to remove Viable and Non-Viable tissue/material. Material removed includes Subcutaneous Tissue and Slough and after achieving pain control using Lidocaine 4% T opical Solution. No specimens were taken. A time out was conducted at 11:18, prior to the start of the procedure. A Minimum amount of bleeding  was controlled with Pressure. The procedure was tolerated well. Post Debridement Measurements: 1cm length x 0.9cm width x 0.2cm depth; 0.141cm^3 volume. Character of Wound/Ulcer Post Debridement requires further debridement. Post procedure  Diagnosis Wound #1: Same as Pre-Procedure General Notes: Scribed for Dr. [name] by Blanche East, RN. Wound #3 Pre-procedure diagnosis of Wound #3 is a Lymphedema located on the Right,Medial Lower Leg . There was a Excisional Skin/Subcutaneous Tissue Debridement with a total area of 10.2 sq cm performed by Fredirick Maudlin, MD. With the following instrument(s): Curette to remove Viable and Non-Viable tissue/material. Material removed includes Subcutaneous Tissue and Slough and after achieving pain control using Lidocaine 4% T opical Solution. No specimens were taken. A time out was conducted at 11:18, prior to the start of the procedure. A Minimum amount of bleeding was controlled with Pressure. The procedure was tolerated well. Post Debridement Measurements: 3cm length x 3.4cm width x 0.2cm depth; 1.602cm^3 volume. Character of Wound/Ulcer Post Debridement requires further debridement. Post procedure Diagnosis Wound #3: Same as Pre-Procedure General Notes: Scribed for Dr. [name] by Blanche East, RN. Wound #6 Pre-procedure diagnosis of Wound #6 is a Calcinosis located on the Right,Anterior Lower Leg . There was a Selective/Open Wound Non-Viable Tissue ALASDAIR, KLEVE ([id number]) [id number] Page 11 of 14 Debridement with a total area of 0.16 sq cm performed by Fredirick Maudlin, MD. With the following instrument(s): Curette to remove Non-Viable tissue/material. Material removed includes [facility] after achieving pain control using Lidocaine 4% Topical Solution. No specimens were taken. A time out was conducted at 11:18, prior to the start of the procedure. A Minimum amount of bleeding was controlled with Pressure. The procedure was tolerated well. Post Debridement Measurements: 0.4cm length x 0.4cm width x 0.1cm depth; 0.013cm^3 volume. Character of Wound/Ulcer Post Debridement requires further debridement. Post procedure Diagnosis Wound #6: Same as Pre-Procedure General  Notes: Scribed for Dr. [name] by Blanche East, RN. Plan Follow-up Appointments: Return Appointment in 1 week. - Dr. [name] Rm 1 Anesthetic: Wound #1 Left,Dorsal Foot: (In clinic) Topical Lidocaine 5% applied to wound bed (In clinic) Topical Lidocaine 4% applied to wound bed Wound #3 Right,Medial Lower Leg: (In clinic) Topical Lidocaine 5% applied to wound bed (In clinic) Topical Lidocaine 4% applied to wound bed Wound #6 Right,Anterior Lower Leg: (In clinic) Topical Lidocaine 5% applied to wound bed (In clinic) Topical Lidocaine 4% applied to wound bed Bathing/ Shower/ Hygiene: May shower with protection but do not get wound dressing(s) wet. Edema Control - Lymphedema / SCD / Other: Lymphedema Pumps. Use Lymphedema pumps on leg(s) 2-3 times a day for 45-60 minutes. If wearing any wraps or hose, do not remove them. Continue exercising as instructed. - use 1-2 times per day over wraps Elevate legs to the level of the heart or above for 30 minutes daily and/or when sitting, a frequency of: Exercise regularly WOUND #1: - Foot Wound Laterality: Dorsal, Left Peri-Wound Care: Sween Lotion (Moisturizing lotion) 1 x Per Week/ Discharge Instructions: Apply moisturizing lotion as directed Prim Dressing: KerraCel Ag Gelling Fiber Dressing, 4x5 in (silver alginate) 1 x Per Week/ ary Discharge Instructions: Apply silver alginate to wound bed as instructed Secondary Dressing: Woven Gauze Sponge, Non-Sterile 4x4 in 1 x Per Week/ Discharge Instructions: Apply over primary dressing as directed. Com pression Wrap: ThreePress (3 layer compression wrap) 1 x Per Week/ Discharge Instructions: Apply three layer compression as directed. WOUND #3: - Lower Leg Wound Laterality: Right, Medial Peri-Wound Care: Zinc Oxide Ointment 30g tube 1 x Per Week/ Discharge  Instructions: Apply Zinc Oxide to periwound with each dressing change Peri-Wound Care: Sween Lotion (Moisturizing lotion) 1 x Per Week/ Discharge  Instructions: Apply moisturizing lotion as directed Topical: Gentamicin 1 x Per Week/ Discharge Instructions: mix with sodium thiosulfate 1:1 Topical: sodium thiosulfate ointment 1 x Per Week/ Discharge Instructions: apply to wound bed Secondary Dressing: ABD Pad, 8x10 1 x Per Week/ Discharge Instructions: Apply over primary dressing as directed. Secondary Dressing: Woven Gauze Sponge, Non-Sterile 4x4 in 1 x Per Week/ Discharge Instructions: Apply over primary dressing as directed. Com pression Wrap: ThreePress (3 layer compression wrap) 1 x Per Week/ Discharge Instructions: Apply three layer compression as directed. WOUND #6: - Lower Leg Wound Laterality: Right, Anterior Peri-Wound Care: Zinc Oxide Ointment 30g tube 1 x Per Week/ Discharge Instructions: Apply Zinc Oxide to periwound with each dressing change Peri-Wound Care: Sween Lotion (Moisturizing lotion) 1 x Per Week/ Discharge Instructions: Apply moisturizing lotion as directed Topical: Gentamicin 1 x Per Week/ Discharge Instructions: mix with sodium thiosulfate 1:1 Topical: sodium thiosulfate ointment 1 x Per Week/ Discharge Instructions: apply to wound bed Secondary Dressing: ABD Pad, 8x10 1 x Per Week/ Discharge Instructions: Apply over primary dressing as directed. Secondary Dressing: Woven Gauze Sponge, Non-Sterile 4x4 in 1 x Per Week/ Discharge Instructions: Apply over primary dressing as directed. Com pression Wrap: ThreePress (3 layer compression wrap) 1 x Per Week/ Discharge Instructions: Apply three layer compression as directed. 05/26/2022: The right medial leg wound continues to improve. It is filling with granulation tissue, but still accumulates a fairly thick layer of slough on the surface. The more recent anterior tibial wound has remained quite small, but also has some slough on the surface. The left dorsal foot wound has contracted somewhat and has perimeter epithelialization. I used a curette to debride slough  from all of his wounds and some nonviable subcutaneous tissue, as well, from the right medial leg wounds. We will continue to use the compounded topical sodium thiosulfate and gentamicin on the wounds associated with calcinosis cutis, silver alginate on all sites with bilateral 3 layer compression. Follow-up in 1 week. Electronic Signature(s) Signed: 05/26/2022 11:43:14 AM By: Fredirick Maudlin MD FACS [name] ([id number]) [id number] Page 12 of 14 Entered By: Fredirick Maudlin on 05/26/2022 11:43:14 -------------------------------------------------------------------------------- HxROS Details Patient Name: Date of Service: RANELL, SKIBINSKI 05/26/2022 10:30 A M Medical Record Number: [id number] Patient Account Number: [id number] Date of Birth/Sex: Treating RN: 13-Jun-1951 (71 y.o. Male) Primary Care Provider: PA [name], [place] Other Clinician: Referring Provider: Treating Provider/Extender: [name] in Treatment: 15 Information Obtained From Patient Chart Constitutional Symptoms (General Health) Medical History: Past Medical History Notes: obesity Eyes Medical History: Positive for: Cataracts - right eye removed Negative for: Glaucoma; Optic Neuritis Ear/Nose/Mouth/Throat Medical History: Negative for: Chronic sinus problems/congestion; Middle ear problems Past Medical History Notes: hard of hearing Hematologic/Lymphatic Medical History: Positive for: Anemia - macrocytic; Lymphedema Respiratory Medical History: Past Medical History Notes: frozen diaphragm right Cardiovascular Medical History: Positive for: Congestive Heart Failure; Deep Vein Thrombosis - left leg; Hypertension; Peripheral Venous Disease Past Medical History Notes: hyperlipidemia Endocrine Medical History: Negative for: Type I Diabetes; Type II Diabetes Genitourinary Medical History: Negative for: End Stage Renal Disease Integumentary (Skin) Medical  History: Negative for: History of Burn Musculoskeletal Medical History: Past Medical History Notes: myositis, lumbar DDD, spinal stenosis, cervical facet joint syndrome OCTAVIAN, GODEK ([id number]) [id number] Page 13 of 14 Oncologic Medical History: Negative for: Received Chemotherapy; Received Radiation Psychiatric Medical History: Negative for: Anorexia/bulimia; Confinement Anxiety  HBO Extended History Items Eyes: Cataracts Immunizations Pneumococcal Vaccine: Received Pneumococcal Vaccination: Yes Received Pneumococcal Vaccination On or After 60th Birthday: Yes Implantable Devices No devices added Hospitalization / Surgery History Type of Hospitalization/Surgery cervical fusion lumbar surgery coronary stent placement lasik eye surgery hydrocele excision Family and Social History Cancer: Yes - Mother; Diabetes: No; Heart Disease: Yes - Father,Mother; Hereditary Spherocytosis: No; Hypertension: Yes - Father,Siblings; Kidney Disease: No; Lung Disease: Yes - Siblings; Seizures: No; Stroke: No; Thyroid Problems: No; Tuberculosis: No; Former smoker - quit 1991; Marital Status - Married; Alcohol Use: Moderate; Drug Use: No History; Caffeine Use: Daily - coffee; Financial Concerns: No; Food, Clothing or Shelter Needs: No; Support System Lacking: No; Transportation Concerns: No Electronic Signature(s) Signed: 05/26/2022 12:41:04 PM By: Fredirick Maudlin MD FACS Entered By: Fredirick Maudlin on 05/26/2022 11:40:56 -------------------------------------------------------------------------------- SuperBill Details Patient Name: Date of Service: [name] 05/26/2022 Medical Record Number: [id number] Patient Account Number: [id number] Date of Birth/Sex: Treating RN: 1951/03/25 (71 y.o. 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) I10 Essential (primary) hypertension I25.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 I89.0 Lymphedema, not elsewhere classified Z86.718 Personal history of other venous thrombosis and embolism TREA, LATNER ([id number]) [id number] Page 14 of [place] Procedures : CPT4 Code: [id number] Description: 72536 - DEB SUBQ TISSUE 20 SQ CM/< ICD-10 Diagnosis Description U44.034 Non-pressure chronic ulcer of other part of right lower leg with fat layer expos Modifier: ed Quantity: 1 : CPT4 Code: [id number] Description: 87564 - DEBRIDE WOUND 1ST 20 SQ CM OR < ICD-10 Diagnosis Description L97.522 Non-pressure chronic ulcer of other part of left foot with fat layer exposed Modifier: Quantity: 1 Physician Procedures : CPT4 Code Description Modifier [id number] 99214 - WC PHYS LEVEL 4 - EST PT ICD-10 Diagnosis Description A41.660 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d L97.522 Non-pressure chronic ulcer of other part of left  foot with fat layer exposed 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

## 2022-06-02 ENCOUNTER — Encounter (HOSPITAL_BASED_OUTPATIENT_CLINIC_OR_DEPARTMENT_OTHER): Payer: Medicare Other | Admitting: General Surgery

## 2022-06-02 DIAGNOSIS — I110 Hypertensive heart disease with heart failure: Secondary | ICD-10-CM | POA: Diagnosis not present

## 2022-06-02 DIAGNOSIS — I890 Lymphedema, not elsewhere classified: Secondary | ICD-10-CM | POA: Diagnosis not present

## 2022-06-02 DIAGNOSIS — L942 Calcinosis cutis: Secondary | ICD-10-CM | POA: Diagnosis not present

## 2022-06-02 DIAGNOSIS — I251 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: Secondary | ICD-10-CM | POA: Diagnosis not present

## 2022-06-02 DIAGNOSIS — L97522 Non-pressure chronic ulcer of other part of left foot with fat layer exposed: Secondary | ICD-10-CM | POA: Diagnosis not present

## 2022-06-02 DIAGNOSIS — L97812 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d: Secondary | ICD-10-CM | POA: Diagnosis not present

## 2022-06-02 DIAGNOSIS — I872 Venous insufficiency (chronic) (peripheral): Secondary | ICD-10-CM | POA: Diagnosis not present

## 2022-06-02 NOTE — Progress Notes (Signed)
[name] ([id number]) [id number] Page 1 of 13 Visit Report for 06/02/2022 Arrival Information Details Patient Name: Date of Service: [name], [name] 06/02/2022 10:30 A M Medical Record Number: [id number] Patient Account Number: [id number] Date of Birth/Sex: Treating RN: 1950/12/19 (71 y.o. [name] Primary Care Danyell Awbrey: PA [name], [place] Other Clinician: Referring Chelle Cayton: Treating Arleatha Philipps/Extender: [name] in Treatment: 16 Visit Information History Since Last Visit Added or deleted any medications: No Patient Arrived: Wheel Chair Any new allergies or adverse reactions: No Arrival Time: 10:25 Had a fall or experienced change in No Accompanied By: self activities of daily living that may affect Transfer Assistance: None risk of falls: Patient Identification Verified: Yes Signs or symptoms of abuse/neglect since last visito No Secondary Verification Process Completed: Yes Hospitalized since last visit: No Patient Requires Transmission-Based Precautions: No Implantable device outside of the clinic excluding No Patient Has Alerts: Yes cellular tissue based products placed in the center Patient Alerts: R ABI= N/C, TBI= .75 since last visit: L ABI =N/C, TBI = .57 Has Dressing in Place as Prescribed: Yes Has Compression in Place as Prescribed: Yes Pain Present Now: Yes Electronic Signature(s) Signed: 06/02/2022 4:59:38 PM By: Baruch Gouty RN, BSN Entered By: Baruch Gouty on 06/02/2022 10:26:17 -------------------------------------------------------------------------------- Compression Therapy Details Patient Name: Date of Service: [name] 06/02/2022 10:30 A M Medical Record Number: [id number] Patient Account Number: [id number] Date of Birth/Sex: Treating RN: Oct 19, 1950 (71 y.o. [name] Primary Care Aujanae Mccullum: PA [name], [place] Other Clinician: Referring Yatziri Wainwright: Treating Homer Pfeifer/Extender: [name] in Treatment: 16 Compression Therapy Performed for Wound Assessment: Wound #1 Left,Dorsal Foot Performed By: Clinician Baruch Gouty, RN Compression Type: Three Layer Post Procedure Diagnosis Same as Pre-procedure Electronic Signature(s) Signed: 06/02/2022 4:59:38 PM By: Baruch Gouty RN, BSN Entered By: Baruch Gouty on 06/02/2022 11:05:34 [name] ([id number]) [id number] Page 2 of 13 -------------------------------------------------------------------------------- Compression Therapy Details Patient Name: Date of Service: [name], [name] 06/02/2022 10:30 A M Medical Record Number: [id number] Patient Account Number: [id number] Date of Birth/Sex: Treating RN: [birth year] (71 y.o. [name] Primary Care Javarie Crisp: PA [name], [place] Other Clinician: Referring Florestine Carmical: Treating Ilir Mahrt/Extender: [name] in Treatment: 16 Compression Therapy Performed for Wound Assessment: Wound #3 Right,Medial Lower Leg Performed By: Clinician Baruch Gouty, RN Compression Type: Three Layer Post Procedure Diagnosis Same as Pre-procedure Electronic Signature(s) Signed: 06/02/2022 4:59:38 PM By: Baruch Gouty RN, BSN Entered By: Baruch Gouty on 06/02/2022 11:05:34 -------------------------------------------------------------------------------- Encounter Discharge Information Details Patient Name: Date of Service: [name] 06/02/2022 10:30 A M Medical Record Number: [id number] Patient Account Number: [id number] Date of Birth/Sex: Treating RN: 11/10/1950 (71 y.o. [name] Primary Care Ofelia Podolski: PA [name], [place] Other Clinician: Referring Tila Millirons: Treating Juliya Magill/Extender: [name] in Treatment: 16 Encounter Discharge Information Items Post Procedure Vitals Discharge Condition: Stable Temperature (F): 98.7 Ambulatory Status: Wheelchair Pulse (bpm): 65 Discharge  Destination: Home Respiratory Rate (breaths/min): 18 Transportation: Private Auto Blood Pressure (mmHg): 128/67 Accompanied By: self Schedule Follow-up Appointment: Yes Clinical Summary of Care: Patient Declined Electronic Signature(s) Signed: 06/02/2022 4:59:38 PM By: Baruch Gouty RN, BSN Entered By: Baruch Gouty on 06/02/2022 11:41:15 -------------------------------------------------------------------------------- Lower Extremity Assessment Details Patient Name: Date of Service: [name], [name] 06/02/2022 10:30 A M Medical Record Number: [id number] Patient Account Number: [id number] Date of Birth/Sex: Treating RN: [birth year] (71 y.o. [name] Primary Care Aivy Akter: PA [name], [place] Other Clinician: Referring Cristan Scherzer: Treating Raffael Bugarin/Extender: [name] in Treatment:  16 Edema Assessment Assessed: [Left: No] [Right: No] Edema: [Left: Yes] [Right: Yes] Calf [name], [name] ([id number]) [id number] Page 3 of 13 Left: Right: Point of Measurement: From Medial Instep 41.5 cm 44.5 cm Ankle Left: Right: Point of Measurement: From Medial Instep 25 cm 24 cm Vascular Assessment Pulses: Dorsalis Pedis Palpable: [Left:Yes] [Right:Yes] Electronic Signature(s) Signed: 06/02/2022 4:59:38 PM By: Baruch Gouty RN, BSN Entered By: Baruch Gouty on 06/02/2022 10:32:02 -------------------------------------------------------------------------------- Multi Wound Chart Details Patient Name: Date of Service: [name] 06/02/2022 10:30 A M Medical Record Number: [id number] Patient Account Number: [id number] Date of Birth/Sex: Treating RN: November 03, 1950 (71 y.o. M) Primary Care [name]: PA [name], [place] Other Clinician: Referring [name]: Treating Kahlel Peake/Extender: [name], [name] in Treatment: 16 Vital Signs Height(in): 71 Pulse(bpm): 65 Weight(lbs): 267 Blood Pressure(mmHg): 128/76 Body Mass Index(BMI):  37.2 Temperature(F): 98.7 Respiratory Rate(breaths/min): 18 [1:Photos:] Left, Dorsal Foot Right, Medial Lower Leg Right, Anterior Lower Leg Wound Location: Gradually Appeared Gradually Appeared Gradually Appeared Wounding Event: Lymphedema Lymphedema Calcinosis Primary Etiology: Cataracts, Anemia, Lymphedema, Cataracts, Anemia, Lymphedema, Cataracts, Anemia, Lymphedema, Comorbid History: Congestive Heart Failure, Deep Vein Congestive Heart Failure, Deep Vein Congestive Heart Failure, Deep Vein Thrombosis, Hypertension, Peripheral Thrombosis, Hypertension, Peripheral Thrombosis, Hypertension, Peripheral Venous Disease Venous Disease Venous Disease 12/07/2021 12/07/2021 05/12/2022 Date Acquired: '16 16 3 '$ Weeks of Treatment: Open Open Open Wound Status: No No No Wound Recurrence: 1x0.8x0.1 3.1x3.7x1 0.3x0.5x0.1 Measurements L x W x D (cm) 0.628 9.009 0.118 A (cm) : rea 0.063 9.009 0.012 Volume (cm) : 83.40% -68.70% 50.00% % Reduction in Area: 95.80% -321.80% 50.00% % Reduction in Volume: Full Thickness Without Exposed Full Thickness With Exposed Support Full Thickness Without Exposed Classification: Support Structures Structures Support Structures Medium Large Medium Exudate Amount: Serosanguineous Purulent Serosanguineous Exudate Type: red, brown yellow, brown, green red, brown Exudate Color: Distinct, outline attached Distinct, outline attached Distinct, outline attached Wound Margin: Large (67-100%) Small (1-33%) Small (1-33%) Granulation Amount: DEITRICK, FERRERI ([id number]) [id number] Page 4 of 13 Red Red, Friable Red Granulation Quality: Small (1-33%) Large (67-100%) Large (67-100%) Necrotic Amount: Fat Layer (Subcutaneous Tissue): Yes Fat Layer (Subcutaneous Tissue): Yes Fat Layer (Subcutaneous Tissue): Yes Exposed Structures: Fascia: No Fascia: No Fascia: No Tendon: No Tendon: No Tendon: No Muscle: No Muscle: No Muscle:  No Joint: No Joint: No Joint: No Bone: No Bone: No Bone: No Small (1-33%) Small (1-33%) None Epithelialization: Debridement - Selective/Open Wound Debridement - Excisional Debridement - Excisional Debridement: Pre-procedure Verification/Time Out 11:00 11:00 11:00 Taken: Lidocaine 4% Topical Solution Lidocaine 4% Topical Solution Lidocaine 4% Topical Solution Pain Control: Slough Other, Subcutaneous, Slough Subcutaneous, Slough Tissue Debrided: Non-Viable Tissue Skin/Subcutaneous Tissue Skin/Subcutaneous Tissue Level: 0.8 11.47 0.15 Debridement A (sq cm): rea Curette Curette Curette Instrument: Minimum Minimum Minimum Bleeding: Pressure Pressure Pressure Hemostasis A chieved: 0 0 0 Procedural Pain: 0 0 0 Post Procedural Pain: Procedure was tolerated well Procedure was tolerated well Procedure was tolerated well Debridement Treatment Response: 1x0.8x0.1 3.1x3.7x1 0.3x0.5x0.1 Post Debridement Measurements L x W x D (cm) 0.063 9.009 0.012 Post Debridement Volume: (cm) Rash: Yes Excoriation: Yes No Abnormalities Noted Periwound Skin Texture: No Abnormalities Noted No Abnormalities Noted No Abnormalities Noted Periwound Skin Moisture: Erythema: Yes Erythema: Yes Hemosiderin Staining: Yes Periwound Skin Color: Hemosiderin Staining: Yes N/A Red Streaks N/A Erythema Location: N/A Measured N/A Erythema Measurement: Decreased N/A N/A Erythema Change: No Abnormality No Abnormality No Abnormality Temperature: Yes Yes Yes Tenderness on Palpation: Compression Therapy Compression Therapy Debridement Procedures Performed: Debridement Debridement Treatment Notes Wound #1 (Foot) Wound  Laterality: Dorsal, Left Cleanser Peri-Wound Care Triamcinolone 15 (g) Discharge Instruction: Use triamcinolone 15 (g) as directed Sween Lotion (Moisturizing lotion) Discharge Instruction: Apply moisturizing lotion as directed Topical Primary Dressing KerraCel Ag Gelling Fiber  Dressing, 4x5 in (silver alginate) Discharge Instruction: Apply silver alginate to wound bed as instructed Secondary Dressing Woven Gauze Sponge, Non-Sterile 4x4 in Discharge Instruction: Apply over primary dressing as directed. Secured With Compression Wrap ThreePress (3 layer compression wrap) Discharge Instruction: Apply three layer compression as directed. Compression Stockings Add-Ons Wound #3 (Lower Leg) Wound Laterality: Right, Medial Cleanser Peri-Wound Care Triamcinolone 15 (g) Discharge Instruction: Use triamcinolone 15 (g) mix with zinc to red areas Zinc Oxide Ointment 30g tube Discharge Instruction: Apply Zinc Oxide to periwound with each dressing change Sween Lotion (Moisturizing lotion) Discharge Instruction: Apply moisturizing lotion as directed HECTOR, TAFT ([id number]) [id number] Page 5 of 13 Topical Gentamicin Discharge Instruction: mix with sodium thiosulfate 1:1 sodium thiosulfate ointment Discharge Instruction: apply to wound bed Primary Dressing Medline Woven Gauze Sponges 4x4 (in/in) Discharge Instruction: pack into wound Secondary Dressing ABD Pad, 8x10 Discharge Instruction: Apply over primary dressing as directed. Woven Gauze Sponge, Non-Sterile 4x4 in Discharge Instruction: Apply over primary dressing as directed. Secured With Compression Wrap ThreePress (3 layer compression wrap) Discharge Instruction: Apply three layer compression as directed. Compression Stockings Add-Ons Wound #6 (Lower Leg) Wound Laterality: Right, Anterior Cleanser Peri-Wound Care Zinc Oxide Ointment 30g tube Discharge Instruction: Apply Zinc Oxide to periwound with each dressing change Sween Lotion (Moisturizing lotion) Discharge Instruction: Apply moisturizing lotion as directed Topical Gentamicin Discharge Instruction: mix with sodium thiosulfate 1:1 sodium thiosulfate ointment Discharge Instruction: apply to wound bed Primary  Dressing Secondary Dressing ABD Pad, 8x10 Discharge Instruction: Apply over primary dressing as directed. Woven Gauze Sponge, Non-Sterile 4x4 in Discharge Instruction: Apply over primary dressing as directed. Secured With Compression Wrap ThreePress (3 layer compression wrap) Discharge Instruction: Apply three layer compression as directed. Compression Stockings Add-Ons Electronic Signature(s) Signed: 06/02/2022 12:09:53 PM By: Fredirick Maudlin MD FACS Entered By: Fredirick Maudlin on 06/02/2022 12:09:53 [name] ([id number]) [id number] Page 6 of 13 -------------------------------------------------------------------------------- Multi-Disciplinary Care Plan Details Patient Name: Date of Service: URI, TURNBOUGH 06/02/2022 10:30 A M Medical Record Number: [id number] Patient Account Number: [id number] Date of Birth/Sex: Treating RN: 14-Aug-1950 (71 y.o. [name] Primary Care Nicollette Wilhelmi: PA [name], [place] Other Clinician: Referring Claude Waldman: Treating Kenyetta Fife/Extender: [name] in Treatment: 16 Multidisciplinary Care Plan reviewed with physician Active Inactive Venous Leg Ulcer Nursing Diagnoses: Knowledge deficit related to disease process and management Potential for venous Insuffiency (use before diagnosis confirmed) Goals: Patient will maintain optimal edema control Date Initiated: 02/17/2022 Target Resolution Date: 06/30/2022 Goal Status: Active Interventions: Assess peripheral edema status every visit. Compression as ordered Provide education on venous insufficiency Treatment Activities: Non-invasive vascular studies : 02/17/2022 Therapeutic compression applied : 02/17/2022 Notes: Wound/Skin Impairment Nursing Diagnoses: Impaired tissue integrity Knowledge deficit related to ulceration/compromised skin integrity Goals: Patient/caregiver will verbalize understanding of skin care regimen Date Initiated:  02/17/2022 Target Resolution Date: 06/30/2022 Goal Status: Active Ulcer/skin breakdown will have a volume reduction of 30% by week 4 Date Initiated: 02/17/2022 Date Inactivated: 03/10/2022 Target Resolution Date: 03/10/2022 Unmet Reason: infection being treated, Goal Status: Unmet waiting on derm appte Ulcer/skin breakdown will have a volume reduction of 50% by week 8 Date Initiated: 03/10/2022 Date Inactivated: 04/14/2022 Target Resolution Date: 04/07/2022 Goal Status: Unmet Unmet Reason: calcinosis Ulcer/skin breakdown will have a volume reduction of 80% by week 12 Date Initiated: 04/14/2022 Date  Inactivated: 05/05/2022 Target Resolution Date: 05/05/2022 Unmet Reason: infection, chronic Goal Status: Unmet condition Interventions: Assess patient/caregiver ability to obtain necessary supplies Assess patient/caregiver ability to perform ulcer/skin care regimen upon admission and as needed Assess ulceration(s) every visit Provide education on ulcer and skin care Treatment Activities: Skin care regimen initiated : 02/17/2022 Topical wound management initiated : 02/17/2022 Notes: KENNIETH, PLOTTS ([id number]) [id number] Page 7 of 13 Electronic Signature(s) Signed: 06/02/2022 4:59:38 PM By: Baruch Gouty RN, BSN Entered By: Baruch Gouty on 06/02/2022 10:48:42 -------------------------------------------------------------------------------- Pain Assessment Details Patient Name: Date of Service: TRYSTAN, AKHTAR 06/02/2022 10:30 A M Medical Record Number: [id number] Patient Account Number: [id number] Date of Birth/Sex: Treating RN: 12/04/1950 (71 y.o. [name] Primary Care Jasten Guyette: PA [name], [place] Other Clinician: Referring Meridian Scherger: Treating Marcelino Campos/Extender: [name] in Treatment: 16 Active Problems Location of Pain Severity and Description of Pain Patient Has Paino Yes Site Locations Pain Location: Pain in Ulcers With  Dressing Change: Yes Duration of the Pain. Constant / Intermittento Intermittent Rate the pain. Current Pain Level: 1 Character of Pain Describe the Pain: Aching, Tender Pain Management and Medication Current Pain Management: Is the Current Pain Management Adequate: Adequate How does your wound impact your activities of daily livingo Sleep: No Bathing: No Appetite: No Relationship With Others: No Bladder Continence: No Emotions: No Bowel Continence: No Work: No Toileting: No Drive: No Dressing: No Hobbies: No Electronic Signature(s) Signed: 06/02/2022 4:59:38 PM By: Baruch Gouty RN, BSN Entered By: Baruch Gouty on 06/02/2022 10:27:40 -------------------------------------------------------------------------------- Patient/Caregiver Education Details Patient Name: Date of Service: [name] 11/8/2023andnbsp10:30 Viviana Simpler ([id number]) [id number] Page 8 of 13 Medical Record Number: [id number] Patient Account Number: [id number] Date of Birth/Gender: Treating RN: 17-Apr-1951 (71 y.o. [name] Primary Care Physician: PA [name], [place] Other Clinician: Referring Physician: Treating Physician/Extender: [name] in Treatment: 16 Education Assessment Education Provided To: Patient Education Topics Provided Malignant/Atypical Wounds: Methods: Explain/Verbal Responses: Reinforcements needed, State content correctly Venous: Methods: Explain/Verbal Responses: Reinforcements needed, State content correctly Wound/Skin Impairment: Methods: Explain/Verbal Responses: Reinforcements needed, State content correctly Electronic Signature(s) Signed: 06/02/2022 4:59:38 PM By: Baruch Gouty RN, BSN Entered By: Baruch Gouty on 06/02/2022 10:49:14 -------------------------------------------------------------------------------- Wound Assessment Details Patient Name: Date of Service: [name] 06/02/2022 10:30 A  M Medical Record Number: [id number] Patient Account Number: [id number] Date of Birth/Sex: Treating RN: 04-02-1951 (71 y.o. [name] Primary Care Ninoska Goswick: PA [name], [place] Other Clinician: Referring Izetta Sakamoto: Treating Caelin Rosen/Extender: [name] in Treatment: 16 Wound Status Wound Number: 1 Primary Lymphedema Etiology: Wound Location: Left, Dorsal Foot Wound Open Wounding Event: Gradually Appeared Status: Date Acquired: 12/07/2021 Comorbid Cataracts, Anemia, Lymphedema, Congestive Heart Failure, Deep Weeks Of Treatment: 16 History: Vein Thrombosis, Hypertension, Peripheral Venous Disease Clustered Wound: No Photos Wound Measurements Length: (cm) 1 Width: (cm) 0.8 Depth: (cm) 0.1 Bentson, Shiraz ([id number]) Area: (cm) Volume: (cm) % Reduction in Area: 83.4% % Reduction in Volume: 95.8% Epithelialization: Small (1-33%) [id number] Page 9 of 13 0.628 Tunneling: No 0.063 Undermining: No Wound Description Classification: Full Thickness Without Exposed Support Structures Wound Margin: Distinct, outline attached Exudate Amount: Medium Exudate Type: Serosanguineous Exudate Color: red, brown Foul Odor After Cleansing: No Slough/Fibrino Yes Wound Bed Granulation Amount: Large (67-100%) Exposed Structure Granulation Quality: Red Fascia Exposed: No Necrotic Amount: Small (1-33%) Fat Layer (Subcutaneous Tissue) Exposed: Yes Necrotic Quality: Adherent Slough Tendon Exposed: No Muscle Exposed: No Joint Exposed: No Bone Exposed: No Periwound Skin Texture Texture Color No  Abnormalities Noted: No No Abnormalities Noted: Yes Rash: Yes Erythema Change: Decreased Moisture Temperature / Pain No Abnormalities Noted: Yes Temperature: No Abnormality Tenderness on Palpation: Yes Treatment Notes Wound #1 (Foot) Wound Laterality: Dorsal, Left Cleanser Peri-Wound Care Triamcinolone 15 (g) Discharge Instruction: Use  triamcinolone 15 (g) as directed Sween Lotion (Moisturizing lotion) Discharge Instruction: Apply moisturizing lotion as directed Topical Primary Dressing KerraCel Ag Gelling Fiber Dressing, 4x5 in (silver alginate) Discharge Instruction: Apply silver alginate to wound bed as instructed Secondary Dressing Woven Gauze Sponge, Non-Sterile 4x4 in Discharge Instruction: Apply over primary dressing as directed. Secured With Compression Wrap ThreePress (3 layer compression wrap) Discharge Instruction: Apply three layer compression as directed. Compression Stockings Add-Ons Electronic Signature(s) Signed: 06/02/2022 4:59:38 PM By: Baruch Gouty RN, BSN Entered By: Baruch Gouty on 06/02/2022 10:44:17 -------------------------------------------------------------------------------- Wound Assessment Details Patient Name: Date of Service: [name] 06/02/2022 10:30 A M Medical Record Number: [id number] Patient Account Number: [id number] [name], [name] ([id number]) [id number].pdf Page 10 of 13 Date of Birth/Sex: Treating RN: 13-Jun-1951 (71 y.o. [name] Primary Care Rachel Samples: Other Clinician: PA [name], [place] Referring Undray Allman: Treating Piedad Standiford/Extender: [name] in Treatment: 16 Wound Status Wound Number: 3 Primary Lymphedema Etiology: Wound Location: Right, Medial Lower Leg Wound Open Wounding Event: Gradually Appeared Status: Date Acquired: 12/07/2021 Comorbid Cataracts, Anemia, Lymphedema, Congestive Heart Failure, Deep Weeks Of Treatment: 16 History: Vein Thrombosis, Hypertension, Peripheral Venous Disease Clustered Wound: No Photos Wound Measurements Length: (cm) 3.1 Width: (cm) 3.7 Depth: (cm) 1 Area: (cm) 9.009 Volume: (cm) 9.009 % Reduction in Area: -68.7% % Reduction in Volume: -321.8% Epithelialization: Small (1-33%) Tunneling: No Undermining: No Wound Description Classification: Full Thickness With  Exposed Support Structures Wound Margin: Distinct, outline attached Exudate Amount: Large Exudate Type: Purulent Exudate Color: yellow, brown, green Foul Odor After Cleansing: No Slough/Fibrino Yes Wound Bed Granulation Amount: Small (1-33%) Exposed Structure Granulation Quality: Red, Friable Fascia Exposed: No Necrotic Amount: Large (67-100%) Fat Layer (Subcutaneous Tissue) Exposed: Yes Necrotic Quality: Adherent Slough Tendon Exposed: No Muscle Exposed: No Joint Exposed: No Bone Exposed: No Periwound Skin Texture Texture Color No Abnormalities Noted: Yes No Abnormalities Noted: No Erythema: Yes Moisture Erythema Location: Red Streaks No Abnormalities Noted: Yes Erythema Measurement: Measured Hemosiderin Staining: Yes Temperature / Pain Temperature: No Abnormality Tenderness on Palpation: Yes Treatment Notes Wound #3 (Lower Leg) Wound Laterality: Right, Medial Cleanser Peri-Wound Care Triamcinolone 15 (g) Discharge Instruction: Use triamcinolone 15 (g) mix with zinc to red areas Zinc Oxide Ointment 30g tube Discharge Instruction: Apply Zinc Oxide to periwound with each dressing change Dripps, Conor ([id number]) [id number] Page 11 of 13 Sween Lotion (Moisturizing lotion) Discharge Instruction: Apply moisturizing lotion as directed Topical Gentamicin Discharge Instruction: mix with sodium thiosulfate 1:1 sodium thiosulfate ointment Discharge Instruction: apply to wound bed Primary Dressing Medline Woven Gauze Sponges 4x4 (in/in) Discharge Instruction: pack into wound Secondary Dressing ABD Pad, 8x10 Discharge Instruction: Apply over primary dressing as directed. Woven Gauze Sponge, Non-Sterile 4x4 in Discharge Instruction: Apply over primary dressing as directed. Secured With Compression Wrap ThreePress (3 layer compression wrap) Discharge Instruction: Apply three layer compression as directed. Compression  Stockings Add-Ons Electronic Signature(s) Signed: 06/02/2022 4:59:38 PM By: Baruch Gouty RN, BSN Entered By: Baruch Gouty on 06/02/2022 10:44:51 -------------------------------------------------------------------------------- Wound Assessment Details Patient Name: Date of Service: [name] 06/02/2022 10:30 A M Medical Record Number: [id number] Patient Account Number: [id number] Date of Birth/Sex: Treating RN: [birth year] (71 y.o. [name] Primary Care Koki Buxton: PA [name], [place] Other Clinician: Referring  Shervon Kerwin: Treating Leitha Hyppolite/Extender: Valeria Batman Weeks in Treatment: 16 Wound Status Wound Number: 6 Primary Calcinosis Etiology: Wound Location: Right, Anterior Lower Leg Wound Open Wounding Event: Gradually Appeared Status: Date Acquired: 05/12/2022 Comorbid Cataracts, Anemia, Lymphedema, Congestive Heart Failure, Deep Weeks Of Treatment: 3 History: Vein Thrombosis, Hypertension, Peripheral Venous Disease Clustered Wound: No Photos Wound Measurements Length: (cm) 0.3 [name], [name] ([id number]) Width: (cm) 0.5 Depth: (cm) 0.1 Area: (cm) 0.118 Volume: (cm) 0.012 % Reduction in Area: 50% [id number] Page 12 of 13 % Reduction in Volume: 50% Epithelialization: None Tunneling: No Undermining: No Wound Description Classification: Full Thickness Without Exposed Support Structures Wound Margin: Distinct, outline attached Exudate Amount: Medium Exudate Type: Serosanguineous Exudate Color: red, brown Foul Odor After Cleansing: No Slough/Fibrino Yes Wound Bed Granulation Amount: Small (1-33%) Exposed Structure Granulation Quality: Red Fascia Exposed: No Necrotic Amount: Large (67-100%) Fat Layer (Subcutaneous Tissue) Exposed: Yes Necrotic Quality: Adherent Slough Tendon Exposed: No Muscle Exposed: No Joint Exposed: No Bone Exposed: No Periwound Skin Texture Texture Color No Abnormalities Noted: Yes No  Abnormalities Noted: No Hemosiderin Staining: Yes Moisture No Abnormalities Noted: Yes Temperature / Pain Temperature: No Abnormality Tenderness on Palpation: Yes Treatment Notes Wound #6 (Lower Leg) Wound Laterality: Right, Anterior Cleanser Peri-Wound Care Zinc Oxide Ointment 30g tube Discharge Instruction: Apply Zinc Oxide to periwound with each dressing change Sween Lotion (Moisturizing lotion) Discharge Instruction: Apply moisturizing lotion as directed Topical Gentamicin Discharge Instruction: mix with sodium thiosulfate 1:1 sodium thiosulfate ointment Discharge Instruction: apply to wound bed Primary Dressing Secondary Dressing ABD Pad, 8x10 Discharge Instruction: Apply over primary dressing as directed. Woven Gauze Sponge, Non-Sterile 4x4 in Discharge Instruction: Apply over primary dressing as directed. Secured With Compression Wrap ThreePress (3 layer compression wrap) Discharge Instruction: Apply three layer compression as directed. Compression Stockings Add-Ons Electronic Signature(s) Signed: 06/02/2022 4:59:38 PM By: Baruch Gouty RN, BSN Entered By: Baruch Gouty on 06/02/2022 10:45:23 [name] ([id number]) [id number] Page 13 of 13 -------------------------------------------------------------------------------- Vitals Details Patient Name: Date of Service: [name], [name] 06/02/2022 10:30 A M Medical Record Number: [id number] Patient Account Number: [id number] Date of Birth/Sex: Treating RN: November 18, 1950 (71 y.o. [name] Primary Care Carliss Quast: PA [name], [place] Other Clinician: Referring Herminio Kniskern: Treating Amandamarie Feggins/Extender: [name] in Treatment: 16 Vital Signs Time Taken: 10:26 Temperature (F): 98.7 Height (in): 71 Pulse (bpm): 65 Weight (lbs): 267 Respiratory Rate (breaths/min): 18 Body Mass Index (BMI): 37.2 Blood Pressure (mmHg): 128/76 Reference Range: 80 - 120 mg / dl Electronic  Signature(s) Signed: 06/02/2022 4:59:38 PM By: Baruch Gouty RN, BSN Entered By: Baruch Gouty on 06/02/2022 10:27:06

## 2022-06-02 NOTE — Progress Notes (Signed)
[name], [name] ([id number]) [id number] Page 1 of 14 Visit Report for 06/02/2022 Chief Complaint Document Details Patient Name: Date of Service: [name], [name] 06/02/2022 10:30 A M Medical Record Number: [id number] Patient Account Number: [id number] Date of Birth/Sex: Treating RN: January 23, 1951 (71 y.o. M) Primary Care Provider: PA [name], NO Other Clinician: Referring Provider: Treating Provider/Extender: [name] in Treatment: 16 Information Obtained from: Patient Chief Complaint Patient seen for complaints of Non-Healing Wounds. Electronic Signature(s) Signed: 06/02/2022 12:10:59 PM By: Fredirick Maudlin MD FACS Entered By: Fredirick Maudlin on 06/02/2022 12:10:58 -------------------------------------------------------------------------------- Debridement Details Patient Name: Date of Service: [name] 06/02/2022 10:30 A M Medical Record Number: [id number] Patient Account Number: [id number] Date of Birth/Sex: Treating RN: [birth year] (71 y.o. [name] Primary Care Provider: PA [name], [place] Other Clinician: Referring Provider: Treating Provider/Extender: [name] in Treatment: 16 Debridement Performed for Assessment: Wound #6 Right,Anterior Lower Leg Performed By: Physician Fredirick Maudlin, MD Debridement Type: Debridement Level of Consciousness (Pre-procedure): Awake and Alert Pre-procedure Verification/Time Out Yes - 11:00 Taken: Start Time: 11:01 Pain Control: Lidocaine 4% T opical Solution T Area Debrided (L x W): otal 0.3 (cm) x 0.5 (cm) = 0.15 (cm) Tissue and other material debrided: Viable, Non-Viable, Slough, Subcutaneous, Slough Level: Skin/Subcutaneous Tissue Debridement Description: Excisional Instrument: Curette Bleeding: Minimum Hemostasis Achieved: Pressure Procedural Pain: 0 Post Procedural Pain: 0 Response to Treatment: Procedure was tolerated well Level of Consciousness  (Post- Awake and Alert procedure): Post Debridement Measurements of Total Wound Length: (cm) 0.3 Width: (cm) 0.5 Depth: (cm) 0.1 Volume: (cm) 0.012 Character of Wound/Ulcer Post Debridement: Improved Post Procedure Diagnosis Same as DEMETRE, [name] ([id number]) [id number] Page 2 of 14 Notes Scribed for Dr. [name] by Baruch Gouty, RN Electronic Signature(s) Signed: 06/02/2022 12:55:38 PM By: Fredirick Maudlin MD FACS Signed: 06/02/2022 4:59:38 PM By: Baruch Gouty RN, BSN Entered By: Baruch Gouty on 06/02/2022 11:07:42 -------------------------------------------------------------------------------- Debridement Details Patient Name: Date of Service: [name], [name] 06/02/2022 10:30 A M Medical Record Number: [id number] Patient Account Number: [id number] Date of Birth/Sex: Treating RN: Dec 31, 1950 (71 y.o. [name] Primary Care Provider: PA [name], [place] Other Clinician: Referring Provider: Treating Provider/Extender: [name] in Treatment: 16 Debridement Performed for Assessment: Wound #1 Left,Dorsal Foot Performed By: Physician Fredirick Maudlin, MD Debridement Type: Debridement Level of Consciousness (Pre-procedure): Awake and Alert Pre-procedure Verification/Time Out Yes - 11:00 Taken: Start Time: 11:01 Pain Control: Lidocaine 4% T opical Solution T Area Debrided (L x W): otal 1 (cm) x 0.8 (cm) = 0.8 (cm) Tissue and other material debrided: Non-Viable, Slough, Slough Level: Non-Viable Tissue Debridement Description: Selective/Open Wound Instrument: Curette Bleeding: Minimum Hemostasis Achieved: Pressure Procedural Pain: 0 Post Procedural Pain: 0 Response to Treatment: Procedure was tolerated well Level of Consciousness (Post- Awake and Alert procedure): Post Debridement Measurements of Total Wound Length: (cm) 1 Width: (cm) 0.8 Depth: (cm) 0.1 Volume: (cm) 0.063 Character of Wound/Ulcer  Post Debridement: Improved Post Procedure Diagnosis Same as Pre-procedure Notes Scribed for Dr. [name] by Baruch Gouty, RN Electronic Signature(s) Signed: 06/02/2022 12:55:38 PM By: Fredirick Maudlin MD FACS Signed: 06/02/2022 4:59:38 PM By: Baruch Gouty RN, BSN Entered By: Baruch Gouty on 06/02/2022 11:07:54 Debridement Details -------------------------------------------------------------------------------- [name] ([id number]) [id number] Page 3 of 14 Patient Name: Date of Service: [name], [name] 06/02/2022 10:30 A M Medical Record Number: [id number] Patient Account Number: [id number] Date of Birth/Sex: Treating RN: 11/28/1950 (71 y.o. [name] Primary Care Provider: PA [name], NO Other Clinician: Referring Provider:  Treating Provider/Extender: Valeria Batman Weeks in Treatment: 16 Debridement Performed for Assessment: Wound #3 Right,Medial Lower Leg Performed By: Physician Fredirick Maudlin, MD Debridement Type: Debridement Level of Consciousness (Pre-procedure): Awake and Alert Pre-procedure Verification/Time Out Yes - 11:00 Taken: Start Time: 11:01 Pain Control: Lidocaine 4% T opical Solution T Area Debrided (L x W): otal 3.1 (cm) x 3.7 (cm) = 11.47 (cm) Tissue and other material debrided: Viable, Non-Viable, Slough, Subcutaneous, Slough, Other: calcium deposits Level: Skin/Subcutaneous Tissue Debridement Description: Excisional Instrument: Curette Bleeding: Minimum Hemostasis Achieved: Pressure Procedural Pain: 0 Post Procedural Pain: 0 Response to Treatment: Procedure was tolerated well Level of Consciousness (Post- Awake and Alert procedure): Post Debridement Measurements of Total Wound Length: (cm) 3.1 Width: (cm) 3.7 Depth: (cm) 1 Volume: (cm) 9.009 Character of Wound/Ulcer Post Debridement: Improved Post Procedure Diagnosis Same as Pre-procedure Notes Scribed for Dr. [name] by Baruch Gouty,  RN Electronic Signature(s) Signed: 06/02/2022 12:55:38 PM By: Fredirick Maudlin MD FACS Signed: 06/02/2022 4:59:38 PM By: Baruch Gouty RN, BSN Entered By: Baruch Gouty on 06/02/2022 11:08:44 -------------------------------------------------------------------------------- HPI Details Patient Name: Date of Service: [name] 06/02/2022 10:30 A M Medical Record Number: [id number] Patient Account Number: [id number] Date of Birth/Sex: Treating RN: 10/29/1950 (71 y.o. M) Primary Care Provider: PA [name], NO Other Clinician: Referring Provider: Treating Provider/Extender: [name] in Treatment: 16 History of Present Illness HPI Description: ADMISSION 02/09/2022 This is a 71 year old man who recently moved to [place] from [place]. He has a history of of coronary artery disease and prior left popliteal vein DVT . He is not diabetic and does not smoke. 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 He has had multiple spinal surgeries and has limited mobility. He primarily uses a motorized wheelchair. He has had lymphedema for many years and does have lymphedema pumps although he says he does not use them frequently. He has wounds on his bilateral lower extremities. He was receiving care in [place] for these prior to his move. They were apparently packing his wounds with Betadine soaked gauze. He has worn compression wraps in the past but not recently. He did say that they helped tremendously. In clinic today, his right ABI is noncompressible but has good signals; the left ABI was 1.17. No formal vascular studies have been performed. On his left dorsal foot, there is a circular lesion that exposes the fat layer. There is fairly heavy slough accumulation within the wound, but no significant odor. On his right medial calf, he has multiple pinholes that have slough buildup in them and probe for about 2 to 4 mm in depth. They are draining clear fluid. On  his right lateral midfoot, he has ulceration consistent with venous stasis. It is geographic and has slough accumulation. He has changes in his lower extremities KATHAN, KIRKER ([id number]) [id number] Page 4 of 14 consistent with stasis dermatitis, but no hemosiderin deposition or thickening of the skin. 02/17/2022: All of the wounds are little bit larger today and have more slough accumulation. Today, the medial calf openings are larger and probe to something that feels calcified. There is odor coming from his wounds. His vascular studies are scheduled for August 9 and 10. 02-24-2022 upon evaluation today patient presents for follow-up concerning his ongoing issues here with his lower extremities. With that being said he does have significant problems with what appears to be cellulitis unfortunately the PCR culture that was sent last week got crushed by [employer] in transit. With that  being said we are going to reobtain a culture today although I am actually to do it on the right medial leg as this seems to be the most inflamed area today where there is some questionable drainage as well. I am going to remove some of the calcium deposits which are loosening obtain a deeper culture from this area. Fortunately I do not see any evidence of active infection systemically which is good news but locally I think we are having a bigger issue here. He does have his appointment next Thursday with vascular. Patient has also been referred to [facility] for further evaluation and treatment of the calcinosis for possible sulfate injections. 03/04/2022: The culture that was performed last week grew out multiple species including Klebsiella, Morganella, and Pseudomonas aeruginosa. He had been prescribed Bactrim but this was not adequate to cover all of the species and levofloxacin was recommended. He completed the Bactrim but did not pick up the levofloxacin and begin taking it until yesterday. 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 He had arterial studies performed today. He does have evidence of peripheral arterial disease. Results copied here: +-------+-----------+-----------+------------+------------+ ABI/TBIT oday's ABIT oday's TBIPrevious ABIPrevious TBI +-------+-----------+-----------+------------+------------+ Right [place] 0.75    +-------+-----------+-----------+------------+------------+ Left [place] 0.57    +-------+-----------+-----------+------------+------------+ Arterial wall calcification precludes accurate ankle pressures and ABIs. Summary: Right: Resting right ankle-brachial index indicates noncompressible right lower extremity arteries. The right toe-brachial index is normal. Left: Resting left ankle-brachial index indicates noncompressible left lower extremity arteries. The left toe-brachial index is abnormal. He continues to have further tissue breakdown at the right medial calf and there is ample slough accumulation along with necrotic subcutaneous tissue. The left dorsal foot wound has built up slough, as well. The right medial foot wound is nearly closed with just a light layer of eschar over the surface. The right dorsal lateral foot wound has also accumulated a fair amount of slough and remains quite tender. 03/10/2022: He has been taking the prescribed levofloxacin and has about 3 days left of this. The erythema and induration on his right leg has improved. He continues to experience further breakdown of the right medial calf wound. There is extensive slough and debris accumulation there. There is heavy slough on his left dorsal foot wound, but no odor or purulent drainage. The right medial foot wound appears to be closed. The right dorsal lateral foot wound also has a fair amount of  slough but it is less tender than at our last visit. He still does not have an appointment with dermatology. 03/22/2022: The right anterior tibial wound has closed. The right lateral foot wound is nearly closed with just a little bit of slough. The left dorsal foot wound is smaller and continues to have some slough buildup but less so than at prior visits. There is good granulation tissue underlying the slough. Unfortunately the right medial calf wound continues to deteriorate. It has a foul odor today and a lot of necrotic tissue, the surrounding skin is also erythematous and moist. 03/30/2022: The right lateral foot wound continues to contract and just has a little bit of slough and eschar present. The left dorsal foot wound is also smaller with slough overlying good granulation tissue. The right medial calf wound actually looks quite a bit better today; there is no odor and there is much less necrotic tissue although some  remains present. We have been using topical gentamicin and he has been taking oral Augmentin. 04/07/2022: The patient saw dermatology at [facility] last week. Unfortunately, it appears that they did not read any of the documentation that was provided. 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 Overall, it was a highly unsatisfactory consultation. In the meantime, however, we were able to identify compounding pharmacy who could create a topical sodium thiosulfate ointment. The patient has that with him today. Both of the right foot wounds are now closed. The left dorsal foot wound has contracted but still has a layer of slough on the surface. The medial calf wounds on the right all look a little bit better. They still  have heavy slough along with the chunks of calcium. Everything is stained purple. 04/14/2022: The wound on his dorsal left foot is a little bit smaller again today. There is still slough accumulation on the surface. The medial calf wounds have quite a bit less slough accumulation today. His right leg, however, is warm and erythematous, concerning for cellulitis. 04/14/2022: He completed his course of Augmentin yesterday. The medial calf wound sites are much cleaner today and shallower. There is less fragmented calcium in the wounds. He has a lot of lymphedema fluid-like drainage from these wounds, but no purulent discharge or malodor. The wound on his dorsal left foot has also contracted and is shallower. There is a layer of slough over healthy granulation tissue. 05/05/2022: The left dorsal foot wound is smaller today with less slough and more granulation tissue. The right medial calf wounds are shallower, but have extensive slough and some fat necrosis. The drainage has diminished considerably, however. 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 05/12/2022: The left dorsal foot wound continues to contract and fill with granulation tissue. There is slough on the wound surface. The right medial calf wounds also continue to fill with healthy tissue, but there is remaining slough and fat necrosis present. He had a significant increase in his drainage this week and it was a bright yellow-green color. He also had a new wound open over his right anterior tibial surface associated with the spike of cutaneous calcium. The leg is more red, as well, concerning for infection. 05/19/2022: His culture returned positive for Pseudomonas. He is currently taking a course of oral levofloxacin. We have also been using topical gentamicin mixed in with his sodium thiosulfate. All of the wounds look better  this week. 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 There is slough and nonviable subcutaneous tissue still present. The new anterior tibial wound just has a light layer of slough. The dorsal foot wound also has some slough present and appears a little dry today. 05/26/2022: The right medial leg wound continues to improve. It is feeling with granulation tissue, but still accumulates a fairly thick layer of slough on the surface. The more recent anterior tibial wound has remained quite small, but also has some slough on the surface. The left dorsal foot wound has contracted somewhat and has perimeter epithelialization. He completed his oral levofloxacin. 06/02/2022: The left dorsal foot wound continues to improve significantly.  There is just a little slough on the wound surface. The right medial leg wound had black drainage, but it looks like silver alginate was applied last week and I theorized that silver sulfide was created with a combination of the silver alginate and the [name], [name] ([id number]) [id number] Page 5 of 14 sodium thiosulfate. It did, however, have a bit of an odor to it and extensive slough accumulation. The small anterior tibial wound also had a bit of slough and nonviable subcutaneous tissue present. The skin of his leg was rather macerated, as well. Electronic Signature(s) Signed: 06/02/2022 12:12:31 PM By: Fredirick Maudlin MD FACS Entered By: Fredirick Maudlin on 06/02/2022 12:12:30 -------------------------------------------------------------------------------- Physical Exam Details Patient Name: Date of Service: [name], [name] 06/02/2022 10:30 A M Medical Record Number: [id number] Patient Account Number: [id number] Date of Birth/Sex: Treating RN: May 12, 1951 (71 y.o. M) Primary Care Provider: PA [name], NO Other Clinician: Referring Provider: Treating Provider/Extender: [name], [name] in  Treatment: 16 Constitutional . . . . No acute distress.Marland Kitchen Respiratory Normal work of breathing on room air.. Notes 06/02/2022: The left dorsal foot wound continues to improve significantly. There is just a little slough on the wound surface. The right medial leg wound had black drainage, but it looks like silver alginate was applied last week and I theorized that silver sulfide was created with a combination of the silver alginate and the sodium thiosulfate. It did, however, have a bit of an odor to it and extensive slough accumulation. The small anterior tibial wound also had a bit of slough and nonviable subcutaneous tissue present. The skin of his leg was rather macerated, as well. Electronic Signature(s) Signed: 06/02/2022 12:12:59 PM By: Fredirick Maudlin MD FACS Entered By: Fredirick Maudlin on 06/02/2022 12:12:59 -------------------------------------------------------------------------------- Physician Orders Details Patient Name: Date of Service: [name] 06/02/2022 10:30 A M Medical Record Number: [id number] Patient Account Number: [id number] Date of Birth/Sex: Treating RN: [birth year] (70 y.o. [name] Primary Care Provider: PA [name], [place] Other Clinician: Referring Provider: Treating Provider/Extender: [name] in Treatment: [phone] Verbal / Phone Orders: No Diagnosis Coding ICD-10 Coding Code Description [phone] 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d L97.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) I10 Essential (primary) hypertension I25.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 I89.0 Lymphedema, not elsewhere classified Z86.718 Personal history of other venous thrombosis and embolism Follow-up Appointments CHAD, DONOGHUE ([id number])  [id number] Page 6 of 14 ppointment in 1 week. - Dr. [name] Rm 1 Return A Anesthetic Wound #1 Left,Dorsal Foot (In clinic) Topical Lidocaine 4% applied to wound bed Wound #3 Right,Medial Lower Leg (In clinic) Topical Lidocaine 4% applied to wound bed Wound #6 Right,Anterior Lower Leg (In clinic) Topical Lidocaine 4% applied to wound bed Bathing/ Shower/ Hygiene May shower with protection but do not get wound dressing(s) wet. Edema Control - Lymphedema / SCD / Other Lymphedema Pumps. Use Lymphedema pumps on leg(s) 2-3 times a day for 45-60 minutes. If wearing any wraps or hose, do not remove them. Continue exercising as instructed. - use 1-2 times per day over wraps Elevate legs to the level of the heart or above for 30 minutes daily and/or when sitting, a frequency of: Exercise regularly Wound Treatment Wound #1 - Foot Wound Laterality: Dorsal, Left Peri-Wound Care: Triamcinolone 15 (g) 1 x Per Week Discharge Instructions: Use triamcinolone  15 (g) as directed Peri-Wound Care: Sween Lotion (Moisturizing lotion) 1 x Per Week Discharge Instructions: Apply moisturizing lotion as directed Prim Dressing: KerraCel Ag Gelling Fiber Dressing, 4x5 in (silver alginate) 1 x Per Week ary Discharge Instructions: Apply silver alginate to wound bed as instructed Secondary Dressing: Woven Gauze Sponge, Non-Sterile 4x4 in 1 x Per Week Discharge Instructions: Apply over primary dressing as directed. Compression Wrap: ThreePress (3 layer compression wrap) 1 x Per Week Discharge Instructions: Apply three layer compression as directed. Wound #3 - Lower Leg Wound Laterality: Right, Medial Peri-Wound Care: Triamcinolone 15 (g) 1 x Per Week Discharge Instructions: Use triamcinolone 15 (g) mix with zinc to red areas Peri-Wound Care: Zinc Oxide Ointment 30g tube 1 x Per Week Discharge Instructions: Apply Zinc Oxide to periwound with each dressing change Peri-Wound Care: Sween  Lotion (Moisturizing lotion) 1 x Per Week Discharge Instructions: Apply moisturizing lotion as directed Topical: Gentamicin 1 x Per Week Discharge Instructions: mix with sodium thiosulfate 1:1 Topical: sodium thiosulfate ointment 1 x Per Week Discharge Instructions: apply to wound bed Prim Dressing: Medline Woven Gauze Sponges 4x4 (in/in) ary 1 x Per Week Discharge Instructions: pack into wound Secondary Dressing: ABD Pad, 8x10 1 x Per Week Discharge Instructions: Apply over primary dressing as directed. Secondary Dressing: Woven Gauze Sponge, Non-Sterile 4x4 in 1 x Per Week Discharge Instructions: Apply over primary dressing as directed. Compression Wrap: ThreePress (3 layer compression wrap) 1 x Per Week Discharge Instructions: Apply three layer compression as directed. Wound #6 - Lower Leg Wound Laterality: Right, Anterior Peri-Wound Care: Zinc Oxide Ointment 30g tube 1 x Per Week Discharge Instructions: Apply Zinc Oxide to periwound with each dressing change Peri-Wound Care: Sween Lotion (Moisturizing lotion) 1 x Per Week Discharge Instructions: Apply moisturizing lotion as directed Topical: Gentamicin 1 x Per Week Discharge Instructions: mix with sodium thiosulfate 1:1 Topical: sodium thiosulfate ointment 1 x Per Week IVORY, BAIL ([id number]) [id number] Page 7 of 14 Discharge Instructions: apply to wound bed Secondary Dressing: ABD Pad, 8x10 1 x Per Week Discharge Instructions: Apply over primary dressing as directed. Secondary Dressing: Woven Gauze Sponge, Non-Sterile 4x4 in 1 x Per Week Discharge Instructions: Apply over primary dressing as directed. Compression Wrap: ThreePress (3 layer compression wrap) 1 x Per Week Discharge Instructions: Apply three layer compression as directed. Electronic Signature(s) Signed: 06/02/2022 12:55:38 PM By: Fredirick Maudlin MD FACS Entered By: Fredirick Maudlin on 06/02/2022  12:13:12 -------------------------------------------------------------------------------- Problem List Details Patient Name: Date of Service: [name] 06/02/2022 10:30 A M Medical Record Number: [id number] Patient Account Number: [id number] Date of Birth/Sex: Treating RN: 1950/10/14 (71 y.o. [name] Primary Care Provider: PA [name], [place] Other Clinician: Referring Provider: Treating Provider/Extender: [name] in Treatment: 16 Active Problems ICD-10 Encounter Code Description Active Date MDM Diagnosis L97.812 Non-pressure chronic ulcer of other part of right lower leg with fat layer 02/09/2022 No Yes exposed L97.522 Non-pressure chronic ulcer of other part of left foot with fat layer exposed 02/09/2022 No Yes 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 02/09/2022 No Yes 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) 02/09/2022 No Yes I10 Essential (primary) hypertension 02/09/2022 No Yes I25.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 02/09/2022 No Yes I89.0 Lymphedema, not elsewhere classified 02/09/2022 No Yes Z86.718 Personal history of other venous thrombosis and embolism 02/09/2022 No Yes Inactive Problems ALECSANDER, HATTABAUGH ([id number]) [id number] Page 8 of 14 ICD-10 Code Description Active Date Inactive Date L97.512 Non-pressure chronic ulcer of other part of right foot with fat layer  exposed 02/17/2022 02/17/2022 Resolved Problems Electronic Signature(s) Signed: 06/02/2022 12:09:42 PM By: Fredirick Maudlin MD FACS Entered By: Fredirick Maudlin on 06/02/2022 12:09:41 -------------------------------------------------------------------------------- Progress Note Details Patient Name: Date of Service: [name] 06/02/2022 10:30 A M Medical Record Number: [id number] Patient Account Number: [id number] Date of Birth/Sex: Treating RN: [birth year] (71 y.o. M) Primary Care Provider: PA [name], NO Other  Clinician: Referring Provider: Treating Provider/Extender: [name] in Treatment: 16 Subjective Chief Complaint Information obtained from Patient Patient seen for complaints of Non-Healing Wounds. History of Present Illness (HPI) ADMISSION 02/09/2022 This is a 71 year old man who recently moved to [place] from [place]. He has a history of of coronary artery disease and prior left popliteal vein DVT . He is not diabetic and does not smoke. 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 He has had multiple spinal surgeries and has limited mobility. He primarily uses a motorized wheelchair. He has had lymphedema for many years and does have lymphedema pumps although he says he does not use them frequently. He has wounds on his bilateral lower extremities. He was receiving care in [place] for these prior to his move. They were apparently packing his wounds with Betadine soaked gauze. He has worn compression wraps in the past but not recently. He did say that they helped tremendously. In clinic today, his right ABI is noncompressible but has good signals; the left ABI was 1.17. No formal vascular studies have been performed. On his left dorsal foot, there is a circular lesion that exposes the fat layer. There is fairly heavy slough accumulation within the wound, but no significant odor. On his right medial calf, he has multiple pinholes that have slough buildup in them and probe for about 2 to 4 mm in depth. They are draining clear fluid. On his right lateral midfoot, he has ulceration consistent with venous stasis. It is geographic and has slough accumulation. 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 02/17/2022: All of the wounds are little bit larger today and have more slough accumulation. Today, the medial calf openings are larger and probe to something that feels  calcified. There is odor coming from his wounds. His vascular studies are scheduled for August 9 and 10. 02-24-2022 upon evaluation today patient presents for follow-up concerning his ongoing issues here with his lower extremities. With that being said he does have significant problems with what appears to be cellulitis unfortunately the PCR culture that was sent last week got crushed by [employer] in transit. With that being said we are going to reobtain a culture today although I am actually to do it on the right medial leg as this seems to be the most inflamed area today where there is some questionable drainage as well. I am going to remove some of the calcium deposits which are loosening obtain a deeper culture from this area. Fortunately I do not see any evidence of active infection systemically which is good news but locally I think we are having a bigger issue here. He does have his appointment next Thursday with vascular. Patient has also been referred to [facility] for further evaluation and treatment of the calcinosis for possible sulfate injections. 03/04/2022: The culture that was performed last week grew out multiple species including Klebsiella, Morganella, and Pseudomonas aeruginosa. He had been prescribed Bactrim but this was not adequate to cover all of the species and levofloxacin was recommended.  He completed the Bactrim but did not pick up the levofloxacin and begin taking it until yesterday. 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 He had arterial studies performed today. He does have evidence of peripheral arterial disease. Results copied here: +-------+-----------+-----------+------------+------------+ ABI/TBIT oday's ABIT oday's TBIPrevious ABIPrevious  TBI +-------+-----------+-----------+------------+------------+ Right [place] 0.75    +-------+-----------+-----------+------------+------------+ Left [place] 0.57    +-------+-----------+-----------+------------+------------+ Arterial wall calcification precludes accurate ankle pressures and ABIs. SummaryTALEN, POSER ([id number]) [id number] Page 9 of 14 Right: Resting right ankle-brachial index indicates noncompressible right lower extremity arteries. The right toe-brachial index is normal. Left: Resting left ankle-brachial index indicates noncompressible left lower extremity arteries. The left toe-brachial index is abnormal. He continues to have further tissue breakdown at the right medial calf and there is ample slough accumulation along with necrotic subcutaneous tissue. The left dorsal foot wound has built up slough, as well. The right medial foot wound is nearly closed with just a light layer of eschar over the surface. The right dorsal lateral foot wound has also accumulated a fair amount of slough and remains quite tender. 03/10/2022: He has been taking the prescribed levofloxacin and has about 3 days left of this. The erythema and induration on his right leg has improved. He continues to experience further breakdown of the right medial calf wound. There is extensive slough and debris accumulation there. There is heavy slough on his left dorsal foot wound, but no odor or purulent drainage. The right medial foot wound appears to be closed. The right dorsal lateral foot wound also has a fair amount of slough but it is less tender than at our last visit. He still does not have an appointment with dermatology. 03/22/2022: The right anterior tibial wound has closed. The right lateral foot wound is nearly closed with just a little bit of slough. The left dorsal foot wound is smaller and continues to have some slough buildup but less so than at prior visits. There  is good granulation tissue underlying the slough. Unfortunately the right medial calf wound continues to deteriorate. It has a foul odor today and a lot of necrotic tissue, the surrounding skin is also erythematous and moist. 03/30/2022: The right lateral foot wound continues to contract and just has a little bit of slough and eschar present. The left dorsal foot wound is also smaller with slough overlying good granulation tissue. The right medial calf wound actually looks quite a bit better today; there is no odor and there is much less necrotic tissue although some remains present. We have been using topical gentamicin and he has been taking oral Augmentin. 04/07/2022: The patient saw dermatology at [facility] last week. Unfortunately, it appears that they did not read any of the documentation that was provided. 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 Overall, it was a highly unsatisfactory consultation. In the meantime, however, we were able to identify compounding pharmacy who could create a topical sodium thiosulfate ointment. The patient has that with him today. Both of the right foot wounds are now closed. The left dorsal foot wound has contracted but still has a layer of  slough on the surface. The medial calf wounds on the right all look a little bit better. They still have heavy slough along with the chunks of calcium. Everything is stained purple. 04/14/2022: The wound on his dorsal left foot is a little bit smaller again today. There is still slough accumulation on the surface. The medial calf wounds have quite a bit less slough accumulation today. His right leg, however, is warm and erythematous, concerning for  cellulitis. 04/14/2022: He completed his course of Augmentin yesterday. The medial calf wound sites are much cleaner today and shallower. There is less fragmented calcium in the wounds. He has a lot of lymphedema fluid-like drainage from these wounds, but no purulent discharge or malodor. The wound on his dorsal left foot has also contracted and is shallower. There is a layer of slough over healthy granulation tissue. 05/05/2022: The left dorsal foot wound is smaller today with less slough and more granulation tissue. The right medial calf wounds are shallower, but have extensive slough and some fat necrosis. The drainage has diminished considerably, however. 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 05/12/2022: The left dorsal foot wound continues to contract and fill with granulation tissue. There is slough on the wound surface. The right medial calf wounds also continue to fill with healthy tissue, but there is remaining slough and fat necrosis present. He had a significant increase in his drainage this week and it was a bright yellow-green color. He also had a new wound open over his right anterior tibial surface associated with the spike of cutaneous calcium. The leg is more red, as well, concerning for infection. 05/19/2022: His culture returned positive for Pseudomonas. He is currently taking a course of oral levofloxacin. We have also been using topical gentamicin mixed in with his sodium thiosulfate. All of the wounds look better this week. 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 There is slough and nonviable subcutaneous tissue still present. The new anterior tibial wound just has a light layer of slough. The dorsal foot wound also has some slough present and appears a little dry today. 05/26/2022: The right medial leg wound continues to  improve. It is feeling with granulation tissue, but still accumulates a fairly thick layer of slough on the surface. The more recent anterior tibial wound has remained quite small, but also has some slough on the surface. The left dorsal foot wound has contracted somewhat and has perimeter epithelialization. He completed his oral levofloxacin. 06/02/2022: The left dorsal foot wound continues to improve significantly. There is just a little slough on the wound surface. The right medial leg wound had black drainage, but it looks like silver alginate was applied last week and I theorized that silver sulfide was created with a combination of the silver alginate and the sodium thiosulfate. It did, however, have a bit of an odor to it and extensive slough accumulation. The small anterior tibial wound also had a bit of slough and nonviable subcutaneous tissue present. The skin of his leg was rather macerated, as well. Patient History Information obtained from Patient, Chart. Family History Cancer - Mother, Heart Disease - Father,Mother, Hypertension - Father,Siblings, Lung Disease - Siblings, No family history of Diabetes, Hereditary Spherocytosis, Kidney Disease, Seizures, Stroke, Thyroid Problems, Tuberculosis. Social History Former smoker - quit 1991, Marital Status - Married, Alcohol Use - Moderate, Drug Use - No  History, Caffeine Use - Daily - coffee. Medical History Eyes Patient has history of Cataracts - right eye removed Denies history of Glaucoma, Optic Neuritis Ear/Nose/Mouth/Throat Denies history of Chronic sinus problems/congestion, Middle ear problems Hematologic/Lymphatic Patient has history of Anemia - macrocytic, Lymphedema Cardiovascular Patient has history of Congestive Heart Failure, Deep Vein Thrombosis - left leg, Hypertension, Peripheral Venous Disease Endocrine Denies history of Type I Diabetes, Type II Diabetes Genitourinary Denies history of End Stage Renal  Disease Integumentary (Skin) Denies history of History of Burn Oncologic Denies history of Received Chemotherapy, Received Radiation Psychiatric Denies history of HAROLD, MONCUS ([id number]) [id number] Page 10 of 14 Hospitalization/Surgery History - cervical fusion. - lumbar surgery. - coronary stent placement. - lasik eye surgery. - hydrocele excision. Medical A Surgical History Notes nd Constitutional Symptoms (General Health) obesity Ear/Nose/Mouth/Throat hard of hearing Respiratory frozen diaphragm right Cardiovascular hyperlipidemia Musculoskeletal myositis, lumbar DDD, spinal stenosis, cervical facet joint syndrome Objective Constitutional No acute distress.. Vitals Time Taken: 10:26 AM, Height: 71 in, Weight: 267 lbs, BMI: 37.2, Temperature: 98.7 F, Pulse: 65 bpm, Respiratory Rate: 18 breaths/min, Blood Pressure: 128/76 mmHg. Respiratory Normal work of breathing on room air.. General Notes: 06/02/2022: The left dorsal foot wound continues to improve significantly. There is just a little slough on the wound surface. The right medial leg wound had black drainage, but it looks like silver alginate was applied last week and I theorized that silver sulfide was created with a combination of the silver alginate and the sodium thiosulfate. It did, however, have a bit of an odor to it and extensive slough accumulation. The small anterior tibial wound also had a bit of slough and nonviable subcutaneous tissue present. The skin of his leg was rather macerated, as well. Integumentary (Hair, Skin) Wound #1 status is Open. Original cause of wound was Gradually Appeared. The date acquired was: 12/07/2021. The wound has been in treatment 16 weeks. The wound is located on the Left,Dorsal Foot. The wound measures 1cm length x 0.8cm width x 0.1cm depth; 0.628cm^2 area and 0.063cm^3 volume. There is Fat Layer (Subcutaneous Tissue)  exposed. There is no tunneling or undermining noted. There is a medium amount of serosanguineous drainage noted. The wound margin is distinct with the outline attached to the wound base. There is large (67-100%) red granulation within the wound bed. There is a small (1-33%) amount of necrotic tissue within the wound bed including Adherent Slough. The periwound skin appearance had no abnormalities noted for moisture. The periwound skin appearance had no abnormalities noted for color. The periwound skin appearance exhibited: Rash. Periwound temperature was noted as No Abnormality. The periwound has tenderness on palpation. Wound #3 status is Open. Original cause of wound was Gradually Appeared. The date acquired was: 12/07/2021. The wound has been in treatment 16 weeks. The wound is located on the Right,Medial Lower Leg. The wound measures 3.1cm length x 3.7cm width x 1cm depth; 9.009cm^2 area and 9.009cm^3 volume. There is Fat Layer (Subcutaneous Tissue) exposed. There is no tunneling or undermining noted. There is a large amount of purulent drainage noted. The wound margin is distinct with the outline attached to the wound base. There is small (1-33%) red, friable granulation within the wound bed. There is a large (67-100%) amount of necrotic tissue within the wound bed including Adherent Slough. The periwound skin appearance had no abnormalities noted for texture. The periwound skin appearance had no abnormalities noted for moisture. The periwound skin appearance exhibited: Hemosiderin Staining, Erythema. The surrounding wound  skin color is noted with erythema with red streaks. Erythema is measured. Periwound temperature was noted as No Abnormality. The periwound has tenderness on palpation. Wound #6 status is Open. Original cause of wound was Gradually Appeared. The date acquired was: 05/12/2022. The wound has been in treatment 3 weeks. The wound is located on the Right,Anterior Lower Leg. The wound  measures 0.3cm length x 0.5cm width x 0.1cm depth; 0.118cm^2 area and 0.012cm^3 volume. There is Fat Layer (Subcutaneous Tissue) exposed. There is no tunneling or undermining noted. There is a medium amount of serosanguineous drainage noted. The wound margin is distinct with the outline attached to the wound base. There is small (1-33%) red granulation within the wound bed. There is a large (67-100%) amount of necrotic tissue within the wound bed including Adherent Slough. The periwound skin appearance had no abnormalities noted for texture. The periwound skin appearance had no abnormalities noted for moisture. The periwound skin appearance exhibited: Hemosiderin Staining. Periwound temperature was noted as No Abnormality. The periwound has tenderness on palpation. 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 There was a Selective/Open Wound Non-Viable Tissue Debridement with a total area of 0.8 sq cm performed by Fredirick Maudlin, MD. With the following instrument(s): Curette to remove Non-Viable tissue/material. Material removed includes [facility] after achieving pain control using Lidocaine 4% Topical Solution. No specimens were taken. A time out was conducted at 11:00, prior to the start of the procedure. A Minimum amount of bleeding was controlled with Pressure. The procedure was tolerated well with a  pain level of 0 throughout and a pain level of 0 following the procedure. Post Debridement Measurements: 1cm length x 0.8cm width x 0.1cm depth; 0.063cm^3 volume. Character of Wound/Ulcer Post Debridement is improved. Post procedure Diagnosis Wound #1: Same as Pre-Procedure General Notes: Scribed for Dr. [name] by Baruch Gouty, RN. Pre-procedure diagnosis of Wound #1 is a Lymphedema located on the Left,Dorsal Foot . There was a Three Layer Compression Therapy Procedure by Baruch Gouty, RN. Post procedure Diagnosis Wound #1: Same as Pre-Procedure Wound #3 Pre-procedure diagnosis of Wound #3 is a Lymphedema located on the Right,Medial Lower Leg . There was a Excisional Skin/Subcutaneous Tissue Debridement with a total area of 11.47 sq cm performed by Fredirick Maudlin, MD. With the following instrument(s): Curette to remove Viable and Non-Viable tissue/material. Material removed includes Subcutaneous Tissue, Slough, and Other: calcium deposits after achieving pain control using Lidocaine 4% Topical Solution. No specimens were taken. A time out was conducted at 11:00, prior to the start of the procedure. A Minimum amount of bleeding was controlled with Pressure. The procedure was tolerated well with a pain level of 0 throughout and a pain level of 0 following the procedure. Post Debridement Measurements: 3.1cm length x 3.7cm width x 1cm depth; 9.009cm^3 volume. Character of Wound/Ulcer Post Debridement is improved. Post procedure Diagnosis Wound #3: Same as Pre-Procedure General Notes: Scribed for Dr. [name] by Baruch Gouty, RN. Pre-procedure diagnosis of Wound #3 is a Lymphedema located on the Right,Medial Lower Leg . There was a  Three Layer Compression Therapy Procedure by Baruch Gouty, RN. 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 There was a Excisional  Skin/Subcutaneous Tissue Debridement with a total area of 0.15 sq cm performed by Fredirick Maudlin, MD. With the following instrument(s): Curette to remove Viable and Non-Viable tissue/material. Material removed includes Subcutaneous Tissue and Slough and after achieving pain control using Lidocaine 4% T opical Solution. No specimens were taken. A time out was conducted at 11:00, prior to the start of the procedure. A Minimum amount of bleeding was controlled with Pressure. The procedure was tolerated well with a pain level of 0 throughout and a pain level of 0 following the procedure. Post Debridement Measurements: 0.3cm length x 0.5cm width x 0.1cm depth; 0.012cm^3 volume. Character of Wound/Ulcer Post Debridement is improved. Post procedure Diagnosis Wound #6: Same as Pre-Procedure General Notes: Scribed for Dr. [name] by Baruch Gouty, RN. Plan Follow-up Appointments: Return Appointment in 1 week. - Dr. [name] Rm 1 Anesthetic: Wound #1 Left,Dorsal Foot: (In clinic) Topical Lidocaine 4% applied to wound bed Wound #3 Right,Medial Lower Leg: (In clinic) Topical Lidocaine 4% applied to wound bed Wound #6 Right,Anterior Lower Leg: (In clinic) Topical Lidocaine 4% applied to wound bed Bathing/ Shower/ Hygiene: May shower with protection but do not get wound dressing(s) wet. Edema Control - Lymphedema / SCD / Other: Lymphedema Pumps. Use Lymphedema pumps on leg(s) 2-3 times a day for 45-60 minutes. If wearing any wraps or hose, do not remove them. Continue exercising as instructed. - use 1-2 times per day over wraps Elevate legs to the level of the heart or above for 30 minutes daily and/or when sitting, a frequency of: Exercise regularly WOUND #1: - Foot Wound Laterality: Dorsal, Left Peri-Wound Care: Triamcinolone 15 (g) 1 x Per Week/ Discharge Instructions: Use triamcinolone 15 (g) as directed Peri-Wound Care: Sween Lotion (Moisturizing lotion) 1 x Per Week/ Discharge Instructions:  Apply moisturizing lotion as directed Prim Dressing: KerraCel Ag Gelling Fiber Dressing, 4x5 in (silver alginate) 1 x Per Week/ ary Discharge Instructions: Apply silver alginate to wound bed as instructed Secondary Dressing: Woven Gauze Sponge, Non-Sterile 4x4 in 1 x Per Week/ Discharge Instructions: Apply over primary dressing as directed. Com pression Wrap: ThreePress (3 layer compression wrap) 1 x Per Week/ Discharge Instructions: Apply three layer compression as directed. WOUND #3: - Lower Leg Wound Laterality: Right, Medial Peri-Wound Care: Triamcinolone 15 (g) 1 x Per Week/ Discharge Instructions: Use triamcinolone 15 (g) mix with zinc to red areas Peri-Wound Care: Zinc Oxide Ointment 30g tube 1 x Per Week/ Discharge Instructions: Apply Zinc Oxide to periwound with each dressing change Peri-Wound Care: Sween Lotion (Moisturizing lotion) 1 x Per Week/ Discharge Instructions: Apply moisturizing lotion as directed Topical: Gentamicin 1 x Per Week/ Discharge Instructions: mix with sodium thiosulfate 1:1 Topical: sodium thiosulfate ointment 1 x Per Week/ Discharge Instructions: apply to wound bed Prim Dressing: Medline Woven Gauze Sponges 4x4 (in/in) 1 x Per Week/ MARCELLA, [name] ([id number]) [id number] Page 12 of 14 Discharge Instructions: pack into wound Secondary Dressing: ABD Pad, 8x10 1 x Per Week/ Discharge Instructions: Apply over primary dressing as directed. Secondary Dressing: Woven Gauze Sponge, Non-Sterile 4x4 in 1 x Per Week/ Discharge Instructions: Apply over primary dressing as directed. Com pression Wrap: ThreePress (3 layer compression wrap) 1 x Per Week/ Discharge Instructions: Apply three layer compression as directed. WOUND #6: - Lower Leg Wound Laterality:  Right, Anterior Peri-Wound Care: Zinc Oxide Ointment 30g tube 1 x Per Week/ Discharge Instructions: Apply Zinc Oxide to periwound with each dressing change Peri-Wound Care: Sween  Lotion (Moisturizing lotion) 1 x Per Week/ Discharge Instructions: Apply moisturizing lotion as directed Topical: Gentamicin 1 x Per Week/ Discharge Instructions: mix with sodium thiosulfate 1:1 Topical: sodium thiosulfate ointment 1 x Per Week/ Discharge Instructions: apply to wound bed Secondary Dressing: ABD Pad, 8x10 1 x Per Week/ Discharge Instructions: Apply over primary dressing as directed. Secondary Dressing: Woven Gauze Sponge, Non-Sterile 4x4 in 1 x Per Week/ Discharge Instructions: Apply over primary dressing as directed. Com pression Wrap: ThreePress (3 layer compression wrap) 1 x Per Week/ Discharge Instructions: Apply three layer compression as directed. 06/02/2022: The left dorsal foot wound continues to improve significantly. There is just a little slough on the wound surface. The right medial leg wound had black drainage, but it looks like silver alginate was applied last week and I theorized that silver sulfide was created with a combination of the silver alginate and the sodium thiosulfate. It did, however, have a bit of an odor to it and extensive slough accumulation. The small anterior tibial wound also had a bit of slough and nonviable subcutaneous tissue present. The skin of his leg was rather macerated, as well. I used a curette to debride slough from the dorsal foot wound. I debrided slough and nonviable subcutaneous tissue from both right leg wounds. Very minimal amount of calcium appreciated. We will use the sodium thiosulfate mixed with topical gentamicin for another week on the right leg, but skip the silver alginate at this location; we will continue to use silver alginate on the left foot. Zinc oxide and triamcinolone to the periwound skin. Continue bilateral 3 layer compression. Follow-up in 1 week. Electronic Signature(s) Signed: 06/02/2022 12:15:27 PM By: Fredirick Maudlin MD FACS Entered By: Fredirick Maudlin on 06/02/2022  12:15:27 -------------------------------------------------------------------------------- HxROS Details Patient Name: Date of Service: [name] 06/02/2022 10:30 A M Medical Record Number: [id number] Patient Account Number: [id number] Date of Birth/Sex: Treating RN: [birth year] (71 y.o. M) Primary Care Provider: PA [name], NO Other Clinician: Referring Provider: Treating Provider/Extender: [name] in Treatment: 16 Information Obtained From Patient Chart Constitutional Symptoms (General Health) Medical History: Past Medical History Notes: obesity Eyes Medical History: Positive for: Cataracts - right eye removed Negative for: Glaucoma; Optic Neuritis Ear/Nose/Mouth/Throat Medical History: Negative for: Chronic sinus problems/congestion; Middle ear problems Past Medical History Notes: hard of hearing Hematologic/Lymphatic [name], [name] ([id number]) [id number] Page 13 of 14 Medical History: Positive for: Anemia - macrocytic; Lymphedema Respiratory Medical History: Past Medical History Notes: frozen diaphragm right Cardiovascular Medical History: Positive for: Congestive Heart Failure; Deep Vein Thrombosis - left leg; Hypertension; Peripheral Venous Disease Past Medical History Notes: hyperlipidemia Endocrine Medical History: Negative for: Type I Diabetes; Type II Diabetes Genitourinary Medical History: Negative for: End Stage Renal Disease Integumentary (Skin) Medical History: Negative for: History of Burn Musculoskeletal Medical History: Past Medical History Notes: myositis, lumbar DDD, spinal stenosis, cervical facet joint syndrome Oncologic Medical History: Negative for: Received Chemotherapy; Received Radiation Psychiatric Medical History: Negative for: Anorexia/bulimia; Confinement Anxiety HBO Extended History Items Eyes: Cataracts Immunizations Pneumococcal Vaccine: Received Pneumococcal  Vaccination: Yes Received Pneumococcal Vaccination On or After 60th Birthday: Yes Implantable Devices No devices added Hospitalization / Surgery History Type of Hospitalization/Surgery cervical fusion lumbar surgery coronary stent placement lasik eye surgery hydrocele excision Family and Social History Cancer: Yes - Mother; Diabetes: No; Heart Disease: Yes -  Father,Mother; Hereditary Spherocytosis: No; Hypertension: Yes - Father,Siblings; Kidney Disease: No; Lung Disease: Yes - Siblings; Seizures: No; Stroke: No; Thyroid Problems: No; Tuberculosis: No; Former smoker - quit 1991; Marital Status - Married; Alcohol Use: Moderate; Drug Use: No History; Caffeine Use: Daily - coffee; Financial Concerns: No; Food, Clothing or Shelter Needs: No; Support System Lacking: No; Transportation Concerns: No Electronic Signature(s) Signed: 06/02/2022 12:55:38 PM By: Fredirick Maudlin MD [place], [name] ([id number]) [id number] Page 14 of 14 Signed: 06/02/2022 12:55:38 PM By: Fredirick Maudlin MD FACS Entered By: Fredirick Maudlin on 06/02/2022 12:12:36 -------------------------------------------------------------------------------- SuperBill Details Patient Name: Date of Service: DAMIAN, [name] 06/02/2022 Medical Record Number: [id number] Patient Account Number: [id number] Date of Birth/Sex: Treating RN: 05/23/1951 (71 y.o. 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) I10 Essential (primary) hypertension I25.10 Atherosclerotic heart disease of native coronary artery  without angina pectoris I89.0 Lymphedema, not elsewhere classified Z86.718 Personal history of other venous thrombosis and embolism Facility Procedures : CPT4 Code: [id number] Description: 32951 - DEB SUBQ TISSUE 20 SQ CM/< ICD-10 Diagnosis Description O84.166 Non-pressure chronic ulcer of other part of right lower leg with fat layer expos Modifier: ed Quantity: 1 : CPT4 Code: [id number] Description: 09323 - DEBRIDE WOUND 1ST 20 SQ CM OR < ICD-10 Diagnosis Description 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 Modifier: ed Quantity: 1 Physician Procedures : CPT4 Code Description Modifier [id number] - WC PHYS LEVEL 4 - EST PT ICD-10 Diagnosis Description 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d L97.522 Non-pressure chronic ulcer of other part of left  foot with fat layer exposed 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

## 2022-06-09 ENCOUNTER — Encounter (HOSPITAL_BASED_OUTPATIENT_CLINIC_OR_DEPARTMENT_OTHER): Payer: Medicare Other | Admitting: General Surgery

## 2022-06-09 DIAGNOSIS — L97522 Non-pressure chronic ulcer of other part of left foot with fat layer exposed: Secondary | ICD-10-CM | POA: Diagnosis not present

## 2022-06-09 DIAGNOSIS — I251 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: Secondary | ICD-10-CM | POA: Diagnosis not present

## 2022-06-09 DIAGNOSIS — I872 Venous insufficiency (chronic) (peripheral): Secondary | ICD-10-CM | POA: Diagnosis not present

## 2022-06-09 DIAGNOSIS — I890 Lymphedema, not elsewhere classified: Secondary | ICD-10-CM | POA: Diagnosis not present

## 2022-06-09 DIAGNOSIS — L942 Calcinosis cutis: Secondary | ICD-10-CM | POA: Diagnosis not present

## 2022-06-09 DIAGNOSIS — L97812 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d: Secondary | ICD-10-CM | POA: Diagnosis not present

## 2022-06-09 DIAGNOSIS — I110 Hypertensive heart disease with heart failure: Secondary | ICD-10-CM | POA: Diagnosis not present

## 2022-06-10 NOTE — Progress Notes (Signed)
[name] ([id number]) [id number] Page 1 of 11 Visit Report for 06/09/2022 Arrival Information Details Patient Name: Date of Service: [name], [name] 06/09/2022 12:30 PM Medical Record Number: [id number] Patient Account Number: [id number] Date of Birth/Sex: Treating RN: [name] (71 y.o. [name] Primary Care [name]: PA [name], [place] Other Clinician: Referring [name]: Treating [name]/Extender: [name] in Treatment: 97 Visit Information History Since Last Visit Added or deleted any medications: [name] Patient Arrived: Wheel Chair Any new allergies or adverse reactions: [name] Arrival Time: 12:34 Had a fall or experienced change in [name] Accompanied By: self activities of daily living that may affect Transfer Assistance: None risk of falls: Patient Identification Verified: Yes Signs or symptoms of abuse/neglect since last visito [name] Secondary Verification Process Completed: Yes Hospitalized since last visit: [name] Patient Requires Transmission-Based Precautions: [name] Implantable device outside of the clinic excluding [name] Patient Has Alerts: Yes cellular tissue based products placed in the center Patient Alerts: R ABI= N/C, TBI= .75 since last visit: L ABI =N/C, TBI = .57 Has Dressing in Place as Prescribed: Yes Has Compression in Place as Prescribed: Yes Pain Present Now: [name] Electronic Signature(s) Signed: 06/09/2022 5:37:22 PM By: [name] Gouty RN, BSN Entered By: [name] on 06/09/2022 12:36:39 -------------------------------------------------------------------------------- Compression Therapy Details Patient Name: Date of Service: [name], [name] 06/09/2022 12:30 PM Medical Record Number: [id number] Patient Account Number: [id number] Date of Birth/Sex: Treating RN: [name] (71 y.o. [name] Primary Care [name]: PA [name], [place] Other Clinician: Referring [name]: Treating [name]/Extender: [name] in Treatment: 17 Compression Therapy Performed for Wound Assessment: Wound #1 Left,Dorsal Foot Performed By: Clinician [name] Gouty, RN Compression Type: Three Layer Post Procedure Diagnosis Same as Pre-procedure Electronic Signature(s) Signed: 06/09/2022 5:37:22 PM By: [name] Gouty RN, BSN Entered By: [name] on 06/09/2022 13:07:13 [name] ([id number]) [id number] Page 2 of 11 -------------------------------------------------------------------------------- Compression Therapy Details Patient Name: Date of Service: [name] 06/09/2022 12:30 PM Medical Record Number: [id number] Patient Account Number: [id number] Date of Birth/Sex: Treating RN: [birth year] (72 y.o. [name] Primary Care [name]: PA [name], [place] Other Clinician: Referring [name]: Treating [name]/Extender: [name] in Treatment: 17 Compression Therapy Performed for Wound Assessment: Wound #3 Right,Medial Lower Leg Performed By: Clinician [name] Gouty, RN Compression Type: Three Layer Post Procedure Diagnosis Same as Pre-procedure Electronic Signature(s) Signed: 06/09/2022 5:37:22 PM By: [name] Gouty RN, BSN Entered By: [name] on 06/09/2022 13:07:13 -------------------------------------------------------------------------------- Encounter Discharge Information Details Patient Name: Date of Service: [name], [name] 06/09/2022 12:30 PM Medical Record Number: [id number] Patient Account Number: [id number] Date of Birth/Sex: Treating RN: [name] (71 y.o. [name] Primary Care [name]: PA [name], [place] Other Clinician: Referring [name]: Treating [name]/Extender: [name] in Treatment: 17 Encounter Discharge Information Items Post Procedure Vitals Discharge Condition: Stable Temperature (F): 98.3 Ambulatory Status: Wheelchair Pulse (bpm): 61 Discharge  Destination: Home Respiratory Rate (breaths/min): 18 Transportation: Private Auto Blood Pressure (mmHg): 158/78 Accompanied By: self Schedule Follow-up Appointment: Yes Clinical Summary of Care: Patient Declined Electronic Signature(s) Signed: 06/09/2022 5:37:22 PM By: [name] Gouty RN, BSN Entered By: [name] on 06/09/2022 13:41:01 -------------------------------------------------------------------------------- Lower Extremity Assessment Details Patient Name: Date of Service: [name], [name] 06/09/2022 12:30 PM Medical Record Number: [id number] Patient Account Number: [id number] Date of Birth/Sex: Treating RN: [birth year] [name] (71 y.o. [name] Primary Care [name]: PA [name], [place] Other Clinician: Referring Dalisa Forrer: Treating [name]/Extender: [name] in Treatment: 17 Edema Assessment Assessed: [Left:  [name]] [Right: [name]] Edema: [Left: Yes] [Right: Yes] Calf [name] ([id number]) [id number] Page 3 of 11 Left: Right: Point of Measurement: From Medial Instep 42.7 cm 43.5 cm Ankle Left: Right: Point of Measurement: From Medial Instep 24.5 cm 28 cm Vascular Assessment Pulses: Dorsalis Pedis Palpable: [Left:Yes] [Right:Yes] Electronic Signature(s) Signed: 06/09/2022 5:37:22 PM By: [name] Gouty RN, BSN Entered By: [name] on 06/09/2022 12:44:34 -------------------------------------------------------------------------------- Multi Wound Chart Details Patient Name: Date of Service: [name] 06/09/2022 12:30 PM Medical Record Number: [id number] Patient Account Number: [id number] Date of Birth/Sex: Treating RN: [name] (71 y.o. M) Primary Care [name]: PA [name] Other Clinician: Referring [name]: Treating [name]/Extender: [name], [name] in Treatment: 17 Vital Signs Height(in): 71 Pulse(bpm): 16 Weight(lbs): 1 Blood Pressure(mmHg): 158/78 Body Mass Index(BMI):  37.2 Temperature(F): 98.3 Respiratory Rate(breaths/min): 18 [1:Photos:] Left, Dorsal Foot Right, Medial Lower Leg Right, Anterior Lower Leg Wound Location: Gradually Appeared Gradually Appeared Gradually Appeared Wounding Event: Lymphedema Lymphedema Calcinosis Primary Etiology: Cataracts, Anemia, Lymphedema, Cataracts, Anemia, Lymphedema, Cataracts, Anemia, Lymphedema, Comorbid History: Congestive Heart Failure, Deep Vein Congestive Heart Failure, Deep Vein Congestive Heart Failure, Deep Vein Thrombosis, Hypertension, Peripheral Thrombosis, Hypertension, Peripheral Thrombosis, Hypertension, Peripheral Venous Disease Venous Disease Venous Disease 12/07/2021 12/07/2021 05/12/2022 Date Acquired: '17 17 4 '$ [name] of Treatment: Open Open Open Wound Status: [name] [name] Wound Recurrence: 0.9x0.7x0.1 3x3.4x0.7 0.3x0.3x0.2 Measurements L x W x D (cm) 0.495 8.011 0.071 A (cm) : rea 0.049 5.608 0.014 Volume (cm) : 86.90% -50.00% 69.90% % Reduction in Area: 96.80% -162.50% 41.70% % Reduction in Volume: Full Thickness Without Exposed Full Thickness With Exposed Support Full Thickness Without Exposed Classification: Support Structures Structures Support Structures Medium Medium Medium Exudate Amount: Serosanguineous Serosanguineous Serosanguineous Exudate Type: red, brown red, brown red, brown Exudate Color: [name] Yes [name] Foul Odor A Cleansing: fter N/A [name] N/A Odor Anticipated Due to Product UseMADDEN, GARRON ([id number]) [id number] Page 4 of 11 Distinct, outline attached Distinct, outline attached Distinct, outline attached Wound Margin: Large (67-100%) Medium (34-66%) Medium (34-66%) Granulation Amount: Red Red, Friable Red Granulation Quality: Small (1-33%) Medium (34-66%) Medium (34-66%) Necrotic Amount: Fat Layer (Subcutaneous Tissue): Yes Fat Layer (Subcutaneous Tissue): Yes Fat Layer (Subcutaneous Tissue): Yes Exposed Structures: Fascia:  [name] Fascia: [name] Fascia: [name] Tendon: [name] Tendon: [name] Tendon: [name] Muscle: [name] Muscle: [name] Muscle: [name] Joint: [name] Joint: [name] Joint: [name] Bone: [name] Bone: [name] Bone: [name] Medium (34-66%) Small (1-33%) None Epithelialization: Debridement - Selective/Open Wound Debridement - Excisional Debridement - Selective/Open Wound Debridement: Pre-procedure Verification/Time Out 13:05 13:05 13:05 Taken: Lidocaine 4% Topical Solution Lidocaine 4% Topical Solution Lidocaine 4% Topical Solution Pain Control: Slough Subcutaneous, [employer] Tissue Debrided: Non-Viable Tissue Skin/Subcutaneous Tissue Non-Viable Tissue Level: 0.63 10.2 0.09 Debridement A (sq cm): rea Curette Curette Curette Instrument: Minimum Minimum Minimum Bleeding: Pressure Pressure Pressure Hemostasis A chieved: 0 0 0 Procedural Pain: 0 0 0 Post Procedural Pain: Procedure was tolerated well Procedure was tolerated well Procedure was tolerated well Debridement Treatment Response: 0.9x0.7x0.1 3x3.4x0.7 0.3x0.3x0.2 Post Debridement Measurements L x W x D (cm) 0.049 5.608 0.014 Post Debridement Volume: (cm) Rash: Yes Excoriation: Yes [name] Abnormalities Noted Periwound Skin Texture: [name] Abnormalities Noted [name] Abnormalities Noted [name] Abnormalities Noted Periwound Skin Moisture: Erythema: Yes Erythema: Yes Hemosiderin Staining: Yes Periwound Skin Color: Hemosiderin Staining: Yes N/A Measured N/A Erythema Measurement: Decreased Decreased N/A Erythema Change: [name] Abnormality [name] Abnormality [name] Abnormality Temperature: Yes Yes Yes Tenderness on Palpation: Compression Therapy Compression Therapy Debridement Procedures Performed: Debridement Debridement Treatment Notes Electronic  Signature(s) Signed: 06/09/2022 1:33:18 PM By: Fredirick Maudlin MD FACS Entered By: Fredirick Maudlin on 06/09/2022 13:33:18 -------------------------------------------------------------------------------- [place] Details Patient  Name: Date of Service: [name], [name] 06/09/2022 12:30 PM Medical Record Number: [id number] Patient Account Number: [id number] Date of Birth/Sex: Treating RN: [birth year][name] (71 y.o. [name] Primary Care Briteny Fulghum: PA [name], [place] Other Clinician: Referring Jamesyn Lindell: Treating Chrislynn Mosely/Extender: [name] in Treatment: [place] reviewed with physician Active Inactive Venous Leg Ulcer Nursing Diagnoses: Knowledge deficit related to disease process and management Potential for venous Insuffiency (use before diagnosis confirmed) Goals: Patient will maintain optimal edema control Date Initiated: 02/17/2022 Target Resolution Date: 06/30/2022 Goal Status: Active Interventions: [name], [name] ([id number]) [id number] Page 5 of 11 Assess peripheral edema status every visit. Compression as ordered Provide education on venous insufficiency Treatment Activities: Non-invasive vascular studies : 02/17/2022 Therapeutic compression applied : 02/17/2022 Notes: Wound/Skin Impairment Nursing Diagnoses: Impaired tissue integrity Knowledge deficit related to ulceration/compromised skin integrity Goals: Patient/caregiver will verbalize understanding of skin care regimen Date Initiated: 02/17/2022 Target Resolution Date: 06/30/2022 Goal Status: Active Ulcer/skin breakdown will have a volume reduction of 30% by week 4 Date Initiated: 02/17/2022 Date Inactivated: 03/10/2022 Target Resolution Date: 03/10/2022 Unmet Reason: infection being treated, Goal Status: Unmet waiting on derm appte Ulcer/skin breakdown will have a volume reduction of 50% by week 8 Date Initiated: 03/10/2022 Date Inactivated: 04/14/2022 Target Resolution Date: 04/07/2022 Goal Status: Unmet Unmet Reason: calcinosis Ulcer/skin breakdown will have a volume reduction of 80% by week 12 Date Initiated: 04/14/2022 Date Inactivated: 05/05/2022 Target Resolution Date:  05/05/2022 Unmet Reason: infection, chronic Goal Status: Unmet condition Interventions: Assess patient/caregiver ability to obtain necessary supplies Assess patient/caregiver ability to perform ulcer/skin care regimen upon admission and as needed Assess ulceration(s) every visit Provide education on ulcer and skin care Treatment Activities: Skin care regimen initiated : 02/17/2022 Topical wound management initiated : 02/17/2022 Notes: Electronic Signature(s) Signed: 06/09/2022 5:37:22 PM By: [name] Gouty RN, BSN Entered By: [name] on 06/09/2022 12:55:39 -------------------------------------------------------------------------------- Pain Assessment Details Patient Name: Date of Service: [name], [name] 06/09/2022 12:30 PM Medical Record Number: [id number] Patient Account Number: [id number] Date of Birth/Sex: Treating RN: [birth year][name] (72 y.o. [name] Primary Care Jaretzi Droz: PA [name], [place] Other Clinician: Referring Katy Brickell: Treating Moraima Burd/Extender: [name] in Treatment: 17 Active Problems Location of Pain Severity and Description of Pain Patient Has Paino [name] Site Locations With Dressing ChangeVIYAN, ROSAMOND ([id number]) [id number] Page 6 of 11 Duration of the Pain. Constant / Intermittento Intermittent Rate the pain. Current Pain Level: 0 Worst Pain Level: 3 Least Pain Level: 0 Character of Pain Describe the Pain: Aching Pain Management and Medication Current Pain Management: Other: tolerable Is the Current Pain Management Adequate: Adequate How does your wound impact your activities of daily livingo Sleep: [name] Bathing: [name] Appetite: [name] Relationship With Others: [name] Bladder Continence: [name] Emotions: [name] Bowel Continence: [name] Work: [name] Toileting: [name] Drive: [name] Dressing: [name] Hobbies: [name] Electronic Signature(s) Signed: 06/09/2022 5:37:22 PM By: [name] Gouty RN, BSN Entered By: [name]  on 06/09/2022 12:37:45 -------------------------------------------------------------------------------- Patient/Caregiver Education Details Patient Name: Date of Service: [name], [name] 11/15/2023andnbsp12:30 PM Medical Record Number: [id number] Patient Account Number: [id number] Date of Birth/Gender: Treating RN: [birth year] [name] (71 y.o. [name] Primary Care Physician: PA [name], [place] Other Clinician: Referring Physician: Treating Physician/Extender: [name] in Treatment: 17 Education Assessment Education Provided To: Patient Education Topics Provided Venous: Methods: Explain/Verbal Responses: Reinforcements needed, State  content correctly Wound/Skin Impairment: Methods: Explain/Verbal Responses: Reinforcements needed, State content correctly Electronic Signature(s) Signed: 06/09/2022 5:37:22 PM By: [name] Gouty RN, BSN Drexel, [name] ([id number]) [id number] Page 7 of 11 Signed: 06/09/2022 5:37:22 PM By: [name] Gouty RN, BSN Entered By: [name] on 06/09/2022 12:55:58 -------------------------------------------------------------------------------- Wound Assessment Details Patient Name: Date of Service: [name], [name] 06/09/2022 12:30 PM Medical Record Number: [id number] Patient Account Number: [id number] Date of Birth/Sex: Treating RN: [birth year] (70 y.o. [name] Primary Care Jamarius Saha: PA [name], [place] Other Clinician: Referring Nara Paternoster: Treating Zoey Gilkeson/Extender: [name] in Treatment: 17 Wound Status Wound Number: 1 Primary Lymphedema Etiology: Wound Location: Left, Dorsal Foot Wound Open Wounding Event: Gradually Appeared Status: Date Acquired: 12/07/2021 Comorbid Cataracts, Anemia, Lymphedema, Congestive Heart Failure, Deep [name] Of Treatment: 17 History: Vein Thrombosis, Hypertension, Peripheral Venous Disease Clustered Wound: [name] Photos Wound  Measurements Length: (cm) 0.9 Width: (cm) 0.7 Depth: (cm) 0.1 Area: (cm) 0.495 Volume: (cm) 0.049 % Reduction in Area: 86.9% % Reduction in Volume: 96.8% Epithelialization: Medium (34-66%) Tunneling: [name] Undermining: [name] Wound Description Classification: Full Thickness Without Exposed Suppo Wound Margin: Distinct, outline attached Exudate Amount: Medium Exudate Type: Serosanguineous Exudate Color: red, brown rt Structures Foul Odor After Cleansing: [name] Slough/Fibrino Yes Wound Bed Granulation Amount: Large (67-100%) Exposed Structure Granulation Quality: Red Fascia Exposed: [name] Necrotic Amount: Small (1-33%) Fat Layer (Subcutaneous Tissue) Exposed: Yes Necrotic Quality: Adherent Slough Tendon Exposed: [name] Muscle Exposed: [name] Joint Exposed: [name] Bone Exposed: [name] Periwound Skin Texture Texture Color [name] Abnormalities Noted: Yes [name] Abnormalities Noted: Yes Erythema Change: Decreased Moisture [name] Abnormalities Noted: Yes Temperature / Pain Temperature: [name] Abnormality Tenderness on PalpationJOURNEY, RATTERMAN ([id number]) [id number] Page 8 of 11 Treatment Notes Wound #1 (Foot) Wound Laterality: Dorsal, Left Cleanser Peri-Wound Care Triamcinolone 15 (g) Discharge Instruction: Use triamcinolone 15 (g) as directed Sween Lotion (Moisturizing lotion) Discharge Instruction: Apply moisturizing lotion as directed Topical Primary Dressing KerraCel Ag Gelling Fiber Dressing, 4x5 in (silver alginate) Discharge Instruction: Apply silver alginate to wound bed as instructed Secondary Dressing Woven Gauze Sponge, Non-Sterile 4x4 in Discharge Instruction: Apply over primary dressing as directed. Secured With Compression Wrap ThreePress (3 layer compression wrap) Discharge Instruction: Apply three layer compression as directed. Compression Stockings Add-Ons Electronic Signature(s) Signed: 06/09/2022 5:37:22 PM By: [name] Gouty RN, BSN Entered By:  [name] on 06/09/2022 12:57:48 -------------------------------------------------------------------------------- Wound Assessment Details Patient Name: Date of Service: [name], [name] 06/09/2022 12:30 PM Medical Record Number: [id number] Patient Account Number: [id number] Date of Birth/Sex: Treating RN: [birth year][name] (71 y.o. [name] Primary Care Virna Livengood: PA [name], [place] Other Clinician: Referring Daisi Kentner: Treating Debora Stockdale/Extender: [name] in Treatment: 17 Wound Status Wound Number: 3 Primary Lymphedema Etiology: Wound Location: Right, Medial Lower Leg Wound Open Wounding Event: Gradually Appeared Status: Date Acquired: 12/07/2021 Comorbid Cataracts, Anemia, Lymphedema, Congestive Heart Failure, Deep [name] Of Treatment: 17 History: Vein Thrombosis, Hypertension, Peripheral Venous Disease Clustered Wound: [name] Photos Wound Measurements Vonseggern, Jhan ([id number]) Length: (cm) 3 Width: (cm) 3.4 Depth: (cm) 0.7 Area: (cm) 8.011 Volume: (cm) 5.608 [id number] Page 9 of 11 % Reduction in Area: -50% % Reduction in Volume: -162.5% Epithelialization: Small (1-33%) Tunneling: [name] Undermining: [name] Wound Description Classification: Full Thickness With Exposed Support Structures Wound Margin: Distinct, outline attached Exudate Amount: Medium Exudate Type: Serosanguineous Exudate Color: red, brown Foul Odor After Cleansing: Yes Due to Product Use: [name] Slough/Fibrino Yes Wound Bed Granulation Amount: Medium (34-66%) Exposed Structure Granulation Quality: Red, Friable Fascia Exposed: [name] Necrotic Amount:  Medium (34-66%) Fat Layer (Subcutaneous Tissue) Exposed: Yes Necrotic Quality: Adherent Slough Tendon Exposed: [name] Muscle Exposed: [name] Joint Exposed: [name] Bone Exposed: [name] Periwound Skin Texture Texture Color [name] Abnormalities Noted: Yes [name] Abnormalities Noted: [name] Erythema: Yes Moisture Erythema Measurement:  Measured [name] Abnormalities Noted: Yes Erythema Change: Decreased Hemosiderin Staining: Yes Temperature / Pain Temperature: [name] Abnormality Tenderness on Palpation: Yes Treatment Notes Wound #3 (Lower Leg) Wound Laterality: Right, Medial Cleanser Peri-Wound Care Triamcinolone 15 (g) Discharge Instruction: Use triamcinolone 15 (g) mix with zinc to red areas Sween Lotion (Moisturizing lotion) Discharge Instruction: Apply moisturizing lotion as directed Topical sodium thiosulfate ointment Discharge Instruction: apply to wound bed Primary Dressing Medline Woven Gauze Sponges 4x4 (in/in) Discharge Instruction: pack into wound Secondary Dressing ABD Pad, 8x10 Discharge Instruction: Apply over primary dressing as directed. Woven Gauze Sponge, Non-Sterile 4x4 in Discharge Instruction: Apply over primary dressing as directed. Secured With Compression Wrap ThreePress (3 layer compression wrap) Discharge Instruction: Apply three layer compression as directed. Compression Stockings Add-Ons Electronic Signature(s) Signed: 06/09/2022 5:37:22 PM By: [name] Gouty RN, BSN Entered By: [name] on 06/09/2022 12:58:16 [name] ([id number]) [id number] Page 10 of 11 -------------------------------------------------------------------------------- Wound Assessment Details Patient Name: Date of Service: [name], [name] 06/09/2022 12:30 PM Medical Record Number: [id number] Patient Account Number: [id number] Date of Birth/Sex: Treating RN: [birth year][name] (71 y.o. [name] Primary Care Tysen Roesler: PA [name], [place] Other Clinician: Referring Maycen Degregory: Treating Gabrien Mentink/Extender: [name] in Treatment: 17 Wound Status Wound Number: 6 Primary Calcinosis Etiology: Wound Location: Right, Anterior Lower Leg Wound Open Wounding Event: Gradually Appeared Status: Date Acquired: 05/12/2022 Comorbid Cataracts, Anemia, Lymphedema, Congestive  Heart Failure, Deep [name] Of Treatment: 4 History: Vein Thrombosis, Hypertension, Peripheral Venous Disease Clustered Wound: [name] Photos Wound Measurements Length: (cm) 0.3 Width: (cm) 0.3 Depth: (cm) 0.2 Area: (cm) 0.071 Volume: (cm) 0.014 % Reduction in Area: 69.9% % Reduction in Volume: 41.7% Epithelialization: None Wound Description Classification: Full Thickness Without Exposed Suppor Wound Margin: Distinct, outline attached Exudate Amount: Medium Exudate Type: Serosanguineous Exudate Color: red, brown t Structures Foul Odor After Cleansing: [name] Slough/Fibrino Yes Wound Bed Granulation Amount: Medium (34-66%) Exposed Structure Granulation Quality: Red Fascia Exposed: [name] Necrotic Amount: Medium (34-66%) Fat Layer (Subcutaneous Tissue) Exposed: Yes Necrotic Quality: Adherent Slough Tendon Exposed: [name] Muscle Exposed: [name] Joint Exposed: [name] Bone Exposed: [name] Periwound Skin Texture Texture Color [name] Abnormalities Noted: Yes [name] Abnormalities Noted: [name] Hemosiderin Staining: Yes Moisture [name] Abnormalities Noted: Yes Temperature / Pain Temperature: [name] Abnormality Tenderness on Palpation: Yes Treatment Notes Wound #6 (Lower Leg) Wound Laterality: Right, Anterior [name], [name] ([id number]) [id number] Page 11 of 11 Cleanser Peri-Wound Care Zinc Oxide Ointment 30g tube Discharge Instruction: Apply Zinc Oxide to periwound with each dressing change Sween Lotion (Moisturizing lotion) Discharge Instruction: Apply moisturizing lotion as directed Topical sodium thiosulfate ointment Discharge Instruction: apply to wound bed Primary Dressing Secondary Dressing ABD Pad, 8x10 Discharge Instruction: Apply over primary dressing as directed. Woven Gauze Sponge, Non-Sterile 4x4 in Discharge Instruction: Apply over primary dressing as directed. Secured With Compression Wrap ThreePress (3 layer compression wrap) Discharge Instruction: Apply three layer  compression as directed. Compression Stockings Add-Ons Electronic Signature(s) Signed: 06/09/2022 5:37:22 PM By: [name] Gouty RN, BSN Entered By: [name] on 06/09/2022 12:58:37 -------------------------------------------------------------------------------- [place] Details Patient Name: Date of Service: [name] 06/09/2022 12:30 PM Medical Record Number: [id number] Patient Account Number: [id number] Date of Birth/Sex: Treating RN: [birth year] (71 y.o. [name] Primary Care Marian Meneely: PA [name], [name] Other Clinician:  Referring Suly Vukelich: Treating Shanikka Wonders/Extender: [name] in Treatment: 17 Vital Signs Time Taken: 12:36 Temperature (F): 98.3 Height (in): 71 Pulse (bpm): 61 Weight (lbs): 267 Respiratory Rate (breaths/min): 18 Body Mass Index (BMI): 37.2 Blood Pressure (mmHg): 158/78 Reference Range: 80 - 120 mg / dl Electronic Signature(s) Signed: 06/09/2022 5:37:22 PM By: [name] Gouty RN, BSN Entered By: [name] on 06/09/2022 12:37:00

## 2022-06-10 NOTE — Progress Notes (Signed)
[name] ([id number]) [id number] Page 1 of 14 Visit Report for 06/09/2022 Chief Complaint Document Details Patient Name: Date of Service: [name] 06/09/2022 12:30 PM Medical Record Number: [id number] Patient Account Number: [id number] Date of Birth/Sex: Treating RN: 29-Apr-1951 (71 y.o. M) Primary Care Provider: PA [name], NO Other Clinician: Referring Provider: Treating Provider/Extender: [name] in Treatment: 17 Information Obtained from: Patient Chief Complaint Patient seen for complaints of Non-Healing Wounds. Electronic Signature(s) Signed: 06/09/2022 1:33:26 PM By: Fredirick Maudlin MD FACS Entered By: Fredirick Maudlin on 06/09/2022 13:33:25 -------------------------------------------------------------------------------- Debridement Details Patient Name: Date of Service: [name], [name] 06/09/2022 12:30 PM Medical Record Number: [id number] Patient Account Number: [id number] Date of Birth/Sex: Treating RN: October 25, 1950 (71 y.o. [name] Primary Care Provider: PA [name], [place] Other Clinician: Referring Provider: Treating Provider/Extender: [name] in Treatment: 17 Debridement Performed for Assessment: Wound #3 Right,Medial Lower Leg Performed By: Physician Fredirick Maudlin, MD Debridement Type: Debridement Level of Consciousness (Pre-procedure): Awake and Alert Pre-procedure Verification/Time Out Yes - 13:05 Taken: Start Time: 13:06 Pain Control: Lidocaine 4% T opical Solution T Area Debrided (L x W): otal 3 (cm) x 3.4 (cm) = 10.2 (cm) Tissue and other material debrided: Viable, Non-Viable, Slough, Subcutaneous, Slough Level: Skin/Subcutaneous Tissue Debridement Description: Excisional Instrument: Curette Bleeding: Minimum Hemostasis Achieved: Pressure Procedural Pain: 0 Post Procedural Pain: 0 Response to Treatment: Procedure was tolerated well Level of Consciousness  (Post- Awake and Alert procedure): Post Debridement Measurements of Total Wound Length: (cm) 3 Width: (cm) 3.4 Depth: (cm) 0.7 Volume: (cm) 5.608 Character of Wound/Ulcer Post Debridement: Improved Post Procedure Diagnosis Same as [name], [name] ([id number]) [id number] Page 2 of 14 Notes Scribed for Dr. [name] by Baruch Gouty, RN Electronic Signature(s) Signed: 06/09/2022 5:18:05 PM By: Fredirick Maudlin MD FACS Signed: 06/09/2022 5:37:22 PM By: Baruch Gouty RN, BSN Entered By: Baruch Gouty on 06/09/2022 13:09:55 -------------------------------------------------------------------------------- Debridement Details Patient Name: Date of Service: [name] 06/09/2022 12:30 PM Medical Record Number: [id number] Patient Account Number: [id number] Date of Birth/Sex: Treating RN: 02/22/1951 (71 y.o. [name] Primary Care Provider: PA [name], [place] Other Clinician: Referring Provider: Treating Provider/Extender: [name] in Treatment: 17 Debridement Performed for Assessment: Wound #1 Left,Dorsal Foot Performed By: Physician Fredirick Maudlin, MD Debridement Type: Debridement Level of Consciousness (Pre-procedure): Awake and Alert Pre-procedure Verification/Time Out Yes - 13:05 Taken: Start Time: 13:06 Pain Control: Lidocaine 4% T opical Solution T Area Debrided (L x W): otal 0.9 (cm) x 0.7 (cm) = 0.63 (cm) Tissue and other material debrided: Non-Viable, Slough, Slough Level: Non-Viable Tissue Debridement Description: Selective/Open Wound Instrument: Curette Bleeding: Minimum Hemostasis Achieved: Pressure Procedural Pain: 0 Post Procedural Pain: 0 Response to Treatment: Procedure was tolerated well Level of Consciousness (Post- Awake and Alert procedure): Post Debridement Measurements of Total Wound Length: (cm) 0.9 Width: (cm) 0.7 Depth: (cm) 0.1 Volume: (cm) 0.049 Character of Wound/Ulcer  Post Debridement: Improved Post Procedure Diagnosis Same as Pre-procedure Notes Scribed for Dr. [name] by Baruch Gouty, RN Electronic Signature(s) Signed: 06/09/2022 5:18:05 PM By: Fredirick Maudlin MD FACS Signed: 06/09/2022 5:37:22 PM By: Baruch Gouty RN, BSN Entered By: Baruch Gouty on 06/09/2022 13:10:44 Debridement Details -------------------------------------------------------------------------------- [name] ([id number]) [id number] Page 3 of 14 Patient Name: Date of Service: [name], [name] 06/09/2022 12:30 PM Medical Record Number: [id number] Patient Account Number: [id number] Date of Birth/Sex: Treating RN: 14-Nov-1950 (71 y.o. [name] Primary Care Provider: PA [name], [place] Other Clinician: Referring Provider: Treating Provider/Extender: Fredirick Maudlin  Nicholes Rough Weeks in Treatment: 17 Debridement Performed for Assessment: Wound #6 Right,Anterior Lower Leg Performed By: Physician Fredirick Maudlin, MD Debridement Type: Debridement Level of Consciousness (Pre-procedure): Awake and Alert Pre-procedure Verification/Time Out Yes - 13:05 Taken: Start Time: 13:06 Pain Control: Lidocaine 4% T opical Solution T Area Debrided (L x W): otal 0.3 (cm) x 0.3 (cm) = 0.09 (cm) Tissue and other material debrided: Non-Viable, Slough, Slough Level: Non-Viable Tissue Debridement Description: Selective/Open Wound Instrument: Curette Bleeding: Minimum Hemostasis Achieved: Pressure Procedural Pain: 0 Post Procedural Pain: 0 Response to Treatment: Procedure was tolerated well Level of Consciousness (Post- Awake and Alert procedure): Post Debridement Measurements of Total Wound Length: (cm) 0.3 Width: (cm) 0.3 Depth: (cm) 0.2 Volume: (cm) 0.014 Character of Wound/Ulcer Post Debridement: Improved Post Procedure Diagnosis Same as Pre-procedure Notes Scribed for Dr. [name] by Baruch Gouty, RN Electronic Signature(s) Signed:  06/09/2022 5:18:05 PM By: Fredirick Maudlin MD FACS Signed: 06/09/2022 5:37:22 PM By: Baruch Gouty RN, BSN Entered By: Baruch Gouty on 06/09/2022 13:10:58 -------------------------------------------------------------------------------- HPI Details Patient Name: Date of Service: [name], [name] 06/09/2022 12:30 PM Medical Record Number: [id number] Patient Account Number: [id number] Date of Birth/Sex: Treating RN: 28-Jul-1950 (71 y.o. M) Primary Care Provider: PA [name], NO Other Clinician: Referring Provider: Treating Provider/Extender: [name] in Treatment: 17 History of Present Illness HPI Description: ADMISSION 02/09/2022 This is a 71 year old man who recently moved to [place] from [place]. He has a history of of coronary artery disease and prior left popliteal vein DVT . He is not diabetic and does not smoke. 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 He has had multiple spinal surgeries and has limited mobility. He primarily uses a motorized wheelchair. He has had lymphedema for many years and does have lymphedema pumps although he says he does not use them frequently. He has wounds on his bilateral lower extremities. He was receiving care in [place] for these prior to his move. They were apparently packing his wounds with Betadine soaked gauze. He has worn compression wraps in the past but not recently. He did say that they helped tremendously. In clinic today, his right ABI is noncompressible but has good signals; the left ABI was 1.17. No formal vascular studies have been performed. On his left dorsal foot, there is a circular lesion that exposes the fat layer. There is fairly heavy slough accumulation within the wound, but no significant odor. On his right medial calf, he has multiple pinholes that have slough buildup in them and probe for about 2 to 4 mm in depth. They are draining clear fluid. On his right lateral midfoot, he has  ulceration consistent with venous stasis. It is geographic and has slough accumulation. He has changes in his lower extremities [name], [name] ([id number]) [id number] Page 4 of 14 consistent with stasis dermatitis, but no hemosiderin deposition or thickening of the skin. 02/17/2022: All of the wounds are little bit larger today and have more slough accumulation. Today, the medial calf openings are larger and probe to something that feels calcified. There is odor coming from his wounds. His vascular studies are scheduled for August 9 and 10. 02-24-2022 upon evaluation today patient presents for follow-up concerning his ongoing issues here with his lower extremities. With that being said he does have significant problems with what appears to be cellulitis unfortunately the PCR culture that was sent last week got crushed by UPS in transit. With that being said we are going to reobtain a culture  today although I am actually to do it on the right medial leg as this seems to be the most inflamed area today where there is some questionable drainage as well. I am going to remove some of the calcium deposits which are loosening obtain a deeper culture from this area. Fortunately I do not see any evidence of active infection systemically which is good news but locally I think we are having a bigger issue here. He does have his appointment next Thursday with vascular. Patient has also been referred to [facility] for further evaluation and treatment of the calcinosis for possible sulfate injections. 03/04/2022: The culture that was performed last week grew out multiple species including Klebsiella, Morganella, and Pseudomonas aeruginosa. He had been prescribed Bactrim but this was not adequate to cover all of the species and levofloxacin was recommended. He completed the Bactrim but did not pick up the levofloxacin and begin taking it until yesterday. 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 He had arterial studies performed today. He does have evidence of peripheral arterial disease. Results copied here: +-------+-----------+-----------+------------+------------+ ABI/TBIT oday's ABIT oday's TBIPrevious ABIPrevious TBI +-------+-----------+-----------+------------+------------+ Right [place] 0.75    +-------+-----------+-----------+------------+------------+ Left [place] 0.57    +-------+-----------+-----------+------------+------------+ Arterial wall calcification precludes accurate ankle pressures and ABIs. Summary: Right: Resting right ankle-brachial index indicates noncompressible right lower extremity arteries. The right toe-brachial index is normal. Left: Resting left ankle-brachial index indicates noncompressible left lower extremity arteries. The left toe-brachial index is abnormal. He continues to have further tissue breakdown at the right medial calf and there is ample slough accumulation along with necrotic subcutaneous tissue. The left dorsal foot wound has built up slough, as well. The right medial foot wound is nearly closed with just a light layer of eschar over the surface. The right dorsal lateral foot wound has also accumulated a fair amount of slough and remains quite tender. 03/10/2022: He has been taking the prescribed levofloxacin and has about 3 days left of this. The erythema and induration on his right leg has improved. He continues to experience further breakdown of the right medial calf wound. There is extensive slough and debris accumulation there. There is heavy slough on his left dorsal foot wound, but no odor or purulent drainage. The right medial foot wound appears to be closed. The right dorsal lateral foot wound also has a fair amount of slough but it is less tender than at  our last visit. He still does not have an appointment with dermatology. 03/22/2022: The right anterior tibial wound has closed. The right lateral foot wound is nearly closed with just a little bit of slough. The left dorsal foot wound is smaller and continues to have some slough buildup but less so than at prior visits. There is good granulation tissue underlying the slough. Unfortunately the right medial calf wound continues to deteriorate. It has a foul odor today and a lot of necrotic tissue, the surrounding skin is also erythematous and moist. 03/30/2022: The right lateral foot wound continues to contract and just has a little bit of slough and eschar present. The left dorsal foot wound is also smaller with slough overlying good granulation tissue. The right medial calf wound actually looks quite a bit better today; there is no odor and there is much less necrotic tissue although some remains present. We have been using topical gentamicin and  he has been taking oral Augmentin. 04/07/2022: The patient saw dermatology at [facility] last week. Unfortunately, it appears that they did not read any of the documentation that was provided. 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 Overall, it was a highly unsatisfactory consultation. In the meantime, however, we were able to identify compounding pharmacy who could create a topical sodium thiosulfate ointment. The patient has that with him today. Both of the right foot wounds are now closed. The left dorsal foot wound has contracted but still has a layer of slough on the surface. The medial calf wounds on the right all look a little bit better. They still have heavy slough along with the chunks  of calcium. Everything is stained purple. 04/14/2022: The wound on his dorsal left foot is a little bit smaller again today. There is still slough accumulation on the surface. The medial calf wounds have quite a bit less slough accumulation today. His right leg, however, is warm and erythematous, concerning for cellulitis. 04/14/2022: He completed his course of Augmentin yesterday. The medial calf wound sites are much cleaner today and shallower. There is less fragmented calcium in the wounds. He has a lot of lymphedema fluid-like drainage from these wounds, but no purulent discharge or malodor. The wound on his dorsal left foot has also contracted and is shallower. There is a layer of slough over healthy granulation tissue. 05/05/2022: The left dorsal foot wound is smaller today with less slough and more granulation tissue. The right medial calf wounds are shallower, but have extensive slough and some fat necrosis. The drainage has diminished considerably, however. 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 05/12/2022: The left dorsal foot wound continues to contract and fill with granulation tissue. There is slough on the wound surface. The right medial calf wounds also continue to fill with healthy tissue, but there is remaining slough and fat necrosis present. He had a significant increase in his drainage this week and it was a bright yellow-green color. He also had a new wound open over his right anterior tibial surface associated with the spike of cutaneous calcium. The leg is more red, as well, concerning for infection. 05/19/2022: His culture returned positive for Pseudomonas. He is currently taking a course of oral levofloxacin. We have also been using topical gentamicin mixed in with his sodium thiosulfate. All of the wounds look better this week. The ones associated with his  calcinosis cutis have filled in substantially. There is slough and nonviable subcutaneous tissue still present. The new anterior tibial wound just has a light layer of slough. The dorsal foot wound also has some slough present and appears a little dry today. 05/26/2022: The right medial leg wound continues to improve. It is feeling with granulation tissue, but still accumulates a fairly thick layer of slough on the surface. The more recent anterior tibial wound has remained quite small, but also has some slough on the surface. The left dorsal foot wound has contracted somewhat and has perimeter epithelialization. He completed his oral levofloxacin. 06/02/2022: The left dorsal foot wound continues to improve significantly. There is just a little slough on the wound  surface. The right medial leg wound had black drainage, but it looks like silver alginate was applied last week and I theorized that silver sulfide was created with a combination of the silver alginate and the JUVENCIO, VERDI ([id number]) [id number] Page 5 of 14 sodium thiosulfate. It did, however, have a bit of an odor to it and extensive slough accumulation. The small anterior tibial wound also had a bit of slough and nonviable subcutaneous tissue present. The skin of his leg was rather macerated, as well. 06/09/2022: The left dorsal foot wound is smaller again this week with just a light layer of slough on the surface. The right medial leg wound looks substantially better. The leg and periwound skin are no longer red and macerated. There is good granulation tissue forming with less slough and nonviable tissue. The anterior tibial wound just has a small amount of slough accumulation. Electronic Signature(s) Signed: 06/09/2022 1:34:25 PM By: Fredirick Maudlin MD FACS Entered By: Fredirick Maudlin on 06/09/2022 13:34:24 -------------------------------------------------------------------------------- Physical Exam  Details Patient Name: Date of Service: [name], [name] 06/09/2022 12:30 PM Medical Record Number: [id number] Patient Account Number: [id number] Date of Birth/Sex: Treating RN: 1950/12/08 (71 y.o. M) Primary Care Provider: PA [name], NO Other Clinician: Referring Provider: Treating Provider/Extender: [name], [name] in Treatment: 17 Constitutional Hypertensive, asymptomatic. . . . No acute distress.Marland Kitchen Respiratory Normal work of breathing on room air.. Notes 06/09/2022: The left dorsal foot wound is smaller again this week with just a light layer of slough on the surface. The right medial leg wound looks substantially better. The leg and periwound skin are no longer red and macerated. There is good granulation tissue forming with less slough and nonviable tissue. The anterior tibial wound just has a small amount of slough accumulation. Electronic Signature(s) Signed: 06/09/2022 1:35:27 PM By: Fredirick Maudlin MD FACS Entered By: Fredirick Maudlin on 06/09/2022 13:35:27 -------------------------------------------------------------------------------- Physician Orders Details Patient Name: Date of Service: [name], [name] 06/09/2022 12:30 PM Medical Record Number: [id number] Patient Account Number: [id number] Date of Birth/Sex: Treating RN: [birth year] (71 y.o. [name] Primary Care Provider: PA [name], [place] Other Clinician: Referring Provider: Treating Provider/Extender: [name] in Treatment: 17 Verbal / Phone Orders: No Diagnosis Coding ICD-10 Coding Code Description [id number] 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d L97.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) I10 Essential (primary) hypertension I25.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 angina  pectoris I89.0 Lymphedema, not elsewhere classified Z86.718 Personal history of other venous thrombosis and embolism TYRAN, HUSER ([id number]) [id number] Page 6 of 14 Follow-up Appointments ppointment in 1 week. - Dr. [name] Rm 1 Return A Anesthetic Wound #1 Left,Dorsal Foot (In clinic) Topical Lidocaine 4% applied to wound bed Wound #3 Right,Medial Lower Leg (In clinic) Topical Lidocaine 4% applied to wound bed Wound #6 Right,Anterior Lower Leg (In clinic) Topical Lidocaine 4% applied to wound bed Bathing/ Shower/ Hygiene May shower with protection but do not get wound dressing(s) wet. Edema Control - Lymphedema / SCD / Other Lymphedema Pumps. Use Lymphedema pumps on leg(s) 2-3 times a day for 45-60 minutes. If wearing any wraps or hose, do not remove them. Continue exercising as instructed. - use 1-2 times per day over wraps Elevate legs to the level of the heart or above for 30 minutes daily and/or when sitting, a frequency of: Exercise regularly Wound Treatment Wound #  1 - Foot Wound Laterality: Dorsal, Left Peri-Wound Care: Triamcinolone 15 (g) 1 x Per Week Discharge Instructions: Use triamcinolone 15 (g) as directed Peri-Wound Care: Sween Lotion (Moisturizing lotion) 1 x Per Week Discharge Instructions: Apply moisturizing lotion as directed Prim Dressing: KerraCel Ag Gelling Fiber Dressing, 4x5 in (silver alginate) 1 x Per Week ary Discharge Instructions: Apply silver alginate to wound bed as instructed Secondary Dressing: Woven Gauze Sponge, Non-Sterile 4x4 in 1 x Per Week Discharge Instructions: Apply over primary dressing as directed. Compression Wrap: ThreePress (3 layer compression wrap) 1 x Per Week Discharge Instructions: Apply three layer compression as directed. Wound #3 - Lower Leg Wound Laterality: Right, Medial Peri-Wound Care: Triamcinolone 15 (g) 1 x Per Week Discharge Instructions: Use triamcinolone 15 (g) mix with zinc to red  areas Peri-Wound Care: Sween Lotion (Moisturizing lotion) 1 x Per Week Discharge Instructions: Apply moisturizing lotion as directed Topical: sodium thiosulfate ointment 1 x Per Week Discharge Instructions: apply to wound bed Prim Dressing: Medline Woven Gauze Sponges 4x4 (in/in) ary 1 x Per Week Discharge Instructions: pack into wound Secondary Dressing: ABD Pad, 8x10 1 x Per Week Discharge Instructions: Apply over primary dressing as directed. Secondary Dressing: Woven Gauze Sponge, Non-Sterile 4x4 in 1 x Per Week Discharge Instructions: Apply over primary dressing as directed. Compression Wrap: ThreePress (3 layer compression wrap) 1 x Per Week Discharge Instructions: Apply three layer compression as directed. Wound #6 - Lower Leg Wound Laterality: Right, Anterior Peri-Wound Care: Zinc Oxide Ointment 30g tube 1 x Per Week Discharge Instructions: Apply Zinc Oxide to periwound with each dressing change Peri-Wound Care: Sween Lotion (Moisturizing lotion) 1 x Per Week Discharge Instructions: Apply moisturizing lotion as directed Topical: sodium thiosulfate ointment 1 x Per Week Discharge Instructions: apply to wound bed Secondary Dressing: ABD Pad, 8x10 1 x Per Week Discharge Instructions: Apply over primary dressing as directed. Secondary Dressing: Woven Gauze Sponge, Non-Sterile 4x4 in 1 x Per Week Discharge Instructions: Apply over primary dressing as directed. [name], [name] ([id number]) [id number] Page 7 of 14 Compression Wrap: ThreePress (3 layer compression wrap) 1 x Per Week Discharge Instructions: Apply three layer compression as directed. Electronic Signature(s) Signed: 06/09/2022 5:18:05 PM By: Fredirick Maudlin MD FACS Entered By: Fredirick Maudlin on 06/09/2022 13:35:38 -------------------------------------------------------------------------------- Problem List Details Patient Name: Date of Service: [name], [name] 06/09/2022 12:30 PM Medical  Record Number: [id number] Patient Account Number: [id number] Date of Birth/Sex: Treating RN: 01/15/1951 (71 y.o. [name] Primary Care Provider: PA [name], [place] Other Clinician: Referring Provider: Treating Provider/Extender: [name] in Treatment: 17 Active Problems ICD-10 Encounter Code Description Active Date MDM Diagnosis L97.812 Non-pressure chronic ulcer of other part of right lower leg with fat layer 02/09/2022 No Yes exposed L97.522 Non-pressure chronic ulcer of other part of left foot with fat layer exposed 02/09/2022 No Yes 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 02/09/2022 No Yes 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) 02/09/2022 No Yes I10 Essential (primary) hypertension 02/09/2022 No Yes I25.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 02/09/2022 No Yes I89.0 Lymphedema, not elsewhere classified 02/09/2022 No Yes Z86.718 Personal history of other venous thrombosis and embolism 02/09/2022 No Yes Inactive Problems ICD-10 Code Description Active Date Inactive Date L97.512 Non-pressure chronic ulcer of other part of right foot with fat layer exposed 02/17/2022 02/17/2022 Resolved Problems NASEAN, ZAPF ([id number]) [id number] Page 8 of 14 Electronic Signature(s) Signed: 06/09/2022 1:33:10 PM By: Fredirick Maudlin MD FACS Entered By: Fredirick Maudlin on 06/09/2022 13:33:10 -------------------------------------------------------------------------------- Progress Note Details Patient Name:  Date of Service: [name], [name] 06/09/2022 12:30 PM Medical Record Number: [id number] Patient Account Number: [id number] Date of Birth/Sex: Treating RN: [birth year] (71 y.o. M) Primary Care Provider: PA [name], NO Other Clinician: Referring Provider: Treating Provider/Extender: [name] in Treatment: 17 Subjective Chief Complaint Information obtained from Patient Patient seen for  complaints of Non-Healing Wounds. History of Present Illness (HPI) ADMISSION 02/09/2022 This is a 72 year old man who recently moved to [place] from [place]. He has a history of of coronary artery disease and prior left popliteal vein DVT . He is not diabetic and does not smoke. 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 He has had multiple spinal surgeries and has limited mobility. He primarily uses a motorized wheelchair. He has had lymphedema for many years and does have lymphedema pumps although he says he does not use them frequently. He has wounds on his bilateral lower extremities. He was receiving care in [place] for these prior to his move. They were apparently packing his wounds with Betadine soaked gauze. He has worn compression wraps in the past but not recently. He did say that they helped tremendously. In clinic today, his right ABI is noncompressible but has good signals; the left ABI was 1.17. No formal vascular studies have been performed. On his left dorsal foot, there is a circular lesion that exposes the fat layer. There is fairly heavy slough accumulation within the wound, but no significant odor. On his right medial calf, he has multiple pinholes that have slough buildup in them and probe for about 2 to 4 mm in depth. They are draining clear fluid. On his right lateral midfoot, he has ulceration consistent with venous stasis. It is geographic and has slough accumulation. 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 02/17/2022: All of the wounds are little bit larger today and have more slough accumulation. Today, the medial calf openings are larger and probe to something that feels calcified. There is odor coming from his wounds. His vascular studies are scheduled for August 9 and 10. 02-24-2022 upon evaluation today patient presents for follow-up concerning his ongoing issues  here with his lower extremities. With that being said he does have significant problems with what appears to be cellulitis unfortunately the PCR culture that was sent last week got crushed by [employer] in transit. With that being said we are going to reobtain a culture today although I am actually to do it on the right medial leg as this seems to be the most inflamed area today where there is some questionable drainage as well. I am going to remove some of the calcium deposits which are loosening obtain a deeper culture from this area. Fortunately I do not see any evidence of active infection systemically which is good news but locally I think we are having a bigger issue here. He does have his appointment next Thursday with vascular. Patient has also been referred to [facility] for further evaluation and treatment of the calcinosis for possible sulfate injections. 03/04/2022: The culture that was performed last week grew out multiple species including Klebsiella, Morganella, and Pseudomonas aeruginosa. He had been prescribed Bactrim but this was not adequate to cover all of the species and levofloxacin was recommended. He completed the Bactrim but did not pick up the levofloxacin and begin taking it until yesterday. 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 He had arterial studies performed today. He does have evidence of peripheral arterial disease. Results copied here: +-------+-----------+-----------+------------+------------+ ABI/TBIT oday's ABIT oday's TBIPrevious ABIPrevious TBI +-------+-----------+-----------+------------+------------+ Right Heber 0.75    +-------+-----------+-----------+------------+------------+ Left [place] 0.57    +-------+-----------+-----------+------------+------------+ Arterial wall  calcification precludes accurate ankle pressures and ABIs. Summary: Right: Resting right ankle-brachial index indicates noncompressible right lower extremity arteries. The right toe-brachial index is normal. Left: Resting left ankle-brachial index indicates noncompressible left lower extremity arteries. The left toe-brachial index is abnormal. He continues to have further tissue breakdown at the right medial calf and there is ample slough accumulation along with necrotic subcutaneous tissue. The left dorsal foot wound has built up slough, as well. The right medial foot wound is nearly closed with just a light layer of eschar over the surface. The right dorsal lateral foot wound has also accumulated a fair amount of slough and remains quite tender. FURMAN, TRENTMAN ([id number]) [id number] Page 9 of 14 03/10/2022: He has been taking the prescribed levofloxacin and has about 3 days left of this. The erythema and induration on his right leg has improved. He continues to experience further breakdown of the right medial calf wound. There is extensive slough and debris accumulation there. There is heavy slough on his left dorsal foot wound, but no odor or purulent drainage. The right medial foot wound appears to be closed. The right dorsal lateral foot wound also has a fair amount of slough but it is less tender than at our last visit. He still does not have an appointment with dermatology. 03/22/2022: The right anterior tibial wound has closed. The right lateral foot wound is nearly closed with just a little bit of slough. The left dorsal foot wound is smaller and continues to have some slough buildup but less so than at prior visits. There is good granulation tissue underlying the slough. Unfortunately the right medial calf wound continues to deteriorate. It has a foul odor today and a lot of necrotic tissue, the surrounding skin is also erythematous and moist. 03/30/2022: The right  lateral foot wound continues to contract and just has a little bit of slough and eschar present. The left dorsal foot wound is also smaller with slough overlying good granulation tissue. The right medial calf wound actually looks quite a bit better today; there is no odor and there is much less necrotic tissue although some remains present. We have been using topical gentamicin and he has been taking oral Augmentin. 04/07/2022: The patient saw dermatology at [facility] last week. Unfortunately, it appears that they did not read any of the documentation that was provided. 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 Overall, it was a highly unsatisfactory consultation. In the meantime, however, we were able to identify compounding pharmacy who could create a topical sodium thiosulfate ointment. The patient has that with him today. Both of the right foot wounds are now closed. The left dorsal foot wound has contracted but still has a layer of slough on the surface. The medial calf wounds on the right all look a little bit better. They still have heavy slough along with the chunks of calcium. Everything is  stained purple. 04/14/2022: The wound on his dorsal left foot is a little bit smaller again today. There is still slough accumulation on the surface. The medial calf wounds have quite a bit less slough accumulation today. His right leg, however, is warm and erythematous, concerning for cellulitis. 04/14/2022: He completed his course of Augmentin yesterday. The medial calf wound sites are much cleaner today and shallower. There is less fragmented calcium in the wounds. He has a lot of lymphedema fluid-like drainage from these wounds, but no  purulent discharge or malodor. The wound on his dorsal left foot has also contracted and is shallower. There is a layer of slough over healthy granulation tissue. 05/05/2022: The left dorsal foot wound is smaller today with less slough and more granulation tissue. The right medial calf wounds are shallower, but have extensive slough and some fat necrosis. The drainage has diminished considerably, however. 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 05/12/2022: The left dorsal foot wound continues to contract and fill with granulation tissue. There is slough on the wound surface. The right medial calf wounds also continue to fill with healthy tissue, but there is remaining slough and fat necrosis present. He had a significant increase in his drainage this week and it was a bright yellow-green color. He also had a new wound open over his right anterior tibial surface associated with the spike of cutaneous calcium. The leg is more red, as well, concerning for infection. 05/19/2022: His culture returned positive for Pseudomonas. He is currently taking a course of oral levofloxacin. We have also been using topical gentamicin mixed in with his sodium thiosulfate. All of the wounds look better this week. 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 There is slough and nonviable subcutaneous tissue still present. The new anterior tibial wound just has a light layer of slough. The dorsal foot wound also has some slough present and appears a little dry today. 05/26/2022: The right medial leg wound continues to improve. It is feeling with granulation tissue, but still accumulates a fairly thick layer of slough on the surface. The more recent anterior tibial wound has remained quite small, but also has some slough on the surface. The left dorsal foot wound has  contracted somewhat and has perimeter epithelialization. He completed his oral levofloxacin. 06/02/2022: The left dorsal foot wound continues to improve significantly. There is just a little slough on the wound surface. The right medial leg wound had black drainage, but it looks like silver alginate was applied last week and I theorized that silver sulfide was created with a combination of the silver alginate and the sodium thiosulfate. It did, however, have a bit of an odor to it and extensive slough accumulation. The small anterior tibial wound also had a bit of slough and nonviable subcutaneous tissue present. The skin of his leg was rather macerated, as well. 06/09/2022: The left dorsal foot wound is smaller again this week with just a light layer of slough on the surface. The right medial leg wound looks substantially better. The leg and periwound skin are no longer red and macerated. There is good granulation tissue forming with less slough and nonviable tissue. The anterior tibial wound just has a small amount of slough accumulation. Patient History Information obtained from Patient, Chart. Family History Cancer - Mother, Heart Disease - Father,Mother, Hypertension - Father,Siblings, Lung Disease -  Siblings, No family history of Diabetes, Hereditary Spherocytosis, Kidney Disease, Seizures, Stroke, Thyroid Problems, Tuberculosis. Social History Former smoker - quit 1991, Marital Status - Married, Alcohol Use - Moderate, Drug Use - No History, Caffeine Use - Daily - coffee. Medical History Eyes Patient has history of Cataracts - right eye removed Denies history of Glaucoma, Optic Neuritis Ear/Nose/Mouth/Throat Denies history of Chronic sinus problems/congestion, Middle ear problems Hematologic/Lymphatic Patient has history of Anemia - macrocytic, Lymphedema Cardiovascular Patient has history of Congestive Heart Failure, Deep Vein Thrombosis - left leg, Hypertension, Peripheral Venous  Disease Endocrine Denies history of Type I Diabetes, Type II Diabetes Genitourinary Denies history of End Stage Renal Disease Integumentary (Skin) Denies history of History of Burn Oncologic Denies history of Received Chemotherapy, Received Radiation Psychiatric Denies history of Anorexia/bulimia, Confinement Anxiety Hospitalization/Surgery History - cervical fusion. - lumbar surgery. - coronary stent placement. - lasik eye surgery. - hydrocele excision. Medical A Surgical History Notes nd Constitutional Symptoms [facility]) [name], CRESCENZO ([id number]) [id number] Page 10 of 14 obesity Ear/Nose/Mouth/Throat hard of hearing Respiratory frozen diaphragm right Cardiovascular hyperlipidemia Musculoskeletal myositis, lumbar DDD, spinal stenosis, cervical facet joint syndrome Objective Constitutional Hypertensive, asymptomatic. No acute distress.. Vitals Time Taken: 12:36 PM, Height: 71 in, Weight: 267 lbs, BMI: 37.2, Temperature: 98.3 F, Pulse: 61 bpm, Respiratory Rate: 18 breaths/min, Blood Pressure: 158/78 mmHg. Respiratory Normal work of breathing on room air.. General Notes: 06/09/2022: The left dorsal foot wound is smaller again this week with just a light layer of slough on the surface. The right medial leg wound looks substantially better. The leg and periwound skin are no longer red and macerated. There is good granulation tissue forming with less slough and nonviable tissue. The anterior tibial wound just has a small amount of slough accumulation. Integumentary (Hair, Skin) Wound #1 status is Open. Original cause of wound was Gradually Appeared. The date acquired was: 12/07/2021. The wound has been in treatment 17 weeks. The wound is located on the Left,Dorsal Foot. The wound measures 0.9cm length x 0.7cm width x 0.1cm depth; 0.495cm^2 area and 0.049cm^3 volume. There is Fat Layer (Subcutaneous Tissue) exposed. There is no tunneling or  undermining noted. There is a medium amount of serosanguineous drainage noted. The wound margin is distinct with the outline attached to the wound base. There is large (67-100%) red granulation within the wound bed. There is a small (1-33%) amount of necrotic tissue within the wound bed including Adherent Slough. The periwound skin appearance had no abnormalities noted for texture. The periwound skin appearance had no abnormalities noted for moisture. The periwound skin appearance had no abnormalities noted for color. Periwound temperature was noted as No Abnormality. The periwound has tenderness on palpation. Wound #3 status is Open. Original cause of wound was Gradually Appeared. The date acquired was: 12/07/2021. The wound has been in treatment 17 weeks. The wound is located on the Right,Medial Lower Leg. The wound measures 3cm length x 3.4cm width x 0.7cm depth; 8.011cm^2 area and 5.608cm^3 volume. There is Fat Layer (Subcutaneous Tissue) exposed. There is no tunneling or undermining noted. There is a medium amount of serosanguineous drainage noted. Foul odor after cleansing was noted. The wound margin is distinct with the outline attached to the wound base. There is medium (34-66%) red, friable granulation within the wound bed. There is a medium (34-66%) amount of necrotic tissue within the wound bed including Adherent Slough. The periwound skin appearance had no abnormalities noted for texture. The periwound skin appearance had no abnormalities noted for  moisture. The periwound skin appearance exhibited: Hemosiderin Staining, Erythema. The surrounding wound skin color is noted with erythema. Erythema is measured. Periwound temperature was noted as No Abnormality. The periwound has tenderness on palpation. Wound #6 status is Open. Original cause of wound was Gradually Appeared. The date acquired was: 05/12/2022. The wound has been in treatment 4 weeks. The wound is located on the Right,Anterior  Lower Leg. The wound measures 0.3cm length x 0.3cm width x 0.2cm depth; 0.071cm^2 area and 0.014cm^3 volume. There is Fat Layer (Subcutaneous Tissue) exposed. There is a medium amount of serosanguineous drainage noted. The wound margin is distinct with the outline attached to the wound base. There is medium (34-66%) red granulation within the wound bed. There is a medium (34-66%) amount of necrotic tissue within the wound bed including Adherent Slough. The periwound skin appearance had no abnormalities noted for texture. The periwound skin appearance had no abnormalities noted for moisture. The periwound skin appearance exhibited: Hemosiderin Staining. Periwound temperature was noted as No Abnormality. The periwound has tenderness on palpation. 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 There was a Selective/Open Wound Non-Viable Tissue Debridement with a total area of 0.63 sq cm performed by Fredirick Maudlin, MD. With the following instrument(s): Curette to remove Non-Viable tissue/material. Material removed includes [facility] after achieving pain control using Lidocaine 4% Topical Solution. No specimens were taken. A time out was conducted at 13:05, prior to the start of the procedure. A Minimum amount of bleeding was controlled with Pressure. The procedure was tolerated well with a pain level of 0  throughout and a pain level of 0 following the procedure. Post Debridement Measurements: 0.9cm length x 0.7cm width x 0.1cm depth; 0.049cm^3 volume. Character of Wound/Ulcer Post Debridement is improved. Post procedure Diagnosis Wound #1: Same as Pre-Procedure General Notes: Scribed for Dr. [name] by Baruch Gouty, RN. Pre-procedure diagnosis of Wound #1 is a Lymphedema located on the Left,Dorsal Foot . There was a Three Layer Compression Therapy Procedure by Baruch Gouty, RN. Post procedure Diagnosis Wound #1: Same as Pre-Procedure Wound #3 Pre-procedure diagnosis of Wound #3 is a Lymphedema located on the Right,Medial Lower Leg . There was a Excisional Skin/Subcutaneous Tissue Debridement with a total area of 10.2 sq cm performed by Fredirick Maudlin, MD. With the following instrument(s): Curette to remove Viable and Non-Viable tissue/material. Material removed includes Subcutaneous Tissue and Slough and after achieving pain control using Lidocaine 4% T opical Solution. No specimens were taken. A time out was conducted at 13:05, prior to the start of the procedure. A Minimum amount of bleeding was controlled with Pressure. The procedure was tolerated well with a pain level of 0 throughout and a pain level of 0 following the procedure. Post Debridement Measurements: 3cm length x 3.4cm width x 0.7cm depth; 5.608cm^3 volume. Character of Wound/Ulcer Post Debridement is improved. Post procedure Diagnosis Wound #3: Same as Pre-Procedure General Notes: Scribed for Dr. [name] by Baruch Gouty, RN. Pre-procedure diagnosis of Wound #3 is a Lymphedema located on the Right,Medial Lower Leg . There was  a Three Layer Compression Therapy Procedure by Baruch Gouty, RN. 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 There was a Selective/Open Wound Non-Viable Tissue Debridement with a  total area of 0.09 sq cm performed by Fredirick Maudlin, MD. With the following instrument(s): Curette to remove Non-Viable tissue/material. Material removed includes [facility] after achieving pain control using Lidocaine 4% Topical Solution. No specimens were taken. A time out was conducted at 13:05, prior to the start of the procedure. A Minimum amount of bleeding was controlled with Pressure. The procedure was tolerated well with a pain level of 0 throughout and a pain level of 0 following the procedure. Post Debridement Measurements: 0.3cm length x 0.3cm width x 0.2cm depth; 0.014cm^3 volume. Character of Wound/Ulcer Post Debridement is improved. Post procedure Diagnosis Wound #6: Same as Pre-Procedure General Notes: Scribed for Dr. [name] by Baruch Gouty, RN. Plan Follow-up Appointments: Return Appointment in 1 week. - Dr. [name] Rm 1 Anesthetic: Wound #1 Left,Dorsal Foot: (In clinic) Topical Lidocaine 4% applied to wound bed Wound #3 Right,Medial Lower Leg: (In clinic) Topical Lidocaine 4% applied to wound bed Wound #6 Right,Anterior Lower Leg: (In clinic) Topical Lidocaine 4% applied to wound bed Bathing/ Shower/ Hygiene: May shower with protection but do not get wound dressing(s) wet. Edema Control - Lymphedema / SCD / Other: Lymphedema Pumps. Use Lymphedema pumps on leg(s) 2-3 times a day for 45-60 minutes. If wearing any wraps or hose, do not remove them. Continue exercising as instructed. - use 1-2 times per day over wraps Elevate legs to the level of the heart or above for 30 minutes daily and/or when sitting, a frequency of: Exercise regularly WOUND #1: - Foot Wound Laterality: Dorsal, Left Peri-Wound Care: Triamcinolone 15 (g) 1 x Per Week/ Discharge Instructions: Use triamcinolone 15 (g) as directed Peri-Wound Care: Sween Lotion (Moisturizing lotion) 1 x Per Week/ Discharge Instructions: Apply moisturizing lotion as directed Prim Dressing: KerraCel Ag Gelling Fiber  Dressing, 4x5 in (silver alginate) 1 x Per Week/ ary Discharge Instructions: Apply silver alginate to wound bed as instructed Secondary Dressing: Woven Gauze Sponge, Non-Sterile 4x4 in 1 x Per Week/ Discharge Instructions: Apply over primary dressing as directed. Com pression Wrap: ThreePress (3 layer compression wrap) 1 x Per Week/ Discharge Instructions: Apply three layer compression as directed. WOUND #3: - Lower Leg Wound Laterality: Right, Medial Peri-Wound Care: Triamcinolone 15 (g) 1 x Per Week/ Discharge Instructions: Use triamcinolone 15 (g) mix with zinc to red areas Peri-Wound Care: Sween Lotion (Moisturizing lotion) 1 x Per Week/ Discharge Instructions: Apply moisturizing lotion as directed Topical: sodium thiosulfate ointment 1 x Per Week/ Discharge Instructions: apply to wound bed Prim Dressing: Medline Woven Gauze Sponges 4x4 (in/in) 1 x Per Week/ ary Discharge Instructions: pack into wound Secondary Dressing: ABD Pad, 8x10 1 x Per Week/ Discharge Instructions: Apply over primary dressing as directed. Secondary Dressing: Woven Gauze Sponge, Non-Sterile 4x4 in 1 x Per Week/ Discharge Instructions: Apply over primary dressing as directed. Com pression Wrap: ThreePress (3 layer compression wrap) 1 x Per Week/ Discharge Instructions: Apply three layer compression as directed. WOUND #6: - Lower Leg Wound Laterality: Right, Anterior Peri-Wound Care: Zinc Oxide Ointment 30g tube 1 x Per Domingo Dimes, [name] ([id number]) [id number] Page 12 of 14 Discharge Instructions: Apply Zinc Oxide to periwound with each dressing change Peri-Wound Care: Sween Lotion (Moisturizing lotion) 1 x Per Week/ Discharge Instructions: Apply moisturizing lotion as  directed Topical: sodium thiosulfate ointment 1 x Per Week/ Discharge Instructions: apply to wound bed Secondary Dressing: ABD Pad, 8x10 1 x Per Week/ Discharge Instructions: Apply over primary dressing as  directed. Secondary Dressing: Woven Gauze Sponge, Non-Sterile 4x4 in 1 x Per Week/ Discharge Instructions: Apply over primary dressing as directed. Com pression Wrap: ThreePress (3 layer compression wrap) 1 x Per Week/ Discharge Instructions: Apply three layer compression as directed. 06/09/2022: The left dorsal foot wound is smaller again this week with just a light layer of slough on the surface. The right medial leg wound looks substantially better. The leg and periwound skin are no longer red and macerated. There is good granulation tissue forming with less slough and nonviable tissue. The anterior tibial wound just has a small amount of slough accumulation. I used a curette to debride slough from the anterior right tibial wound and the left dorsal foot wound. I debrided slough and nonviable subcutaneous tissue from the right medial leg wound. We will continue sodium thiosulfate to the right medial leg wound and pack the site with gauze. Silver alginate to the anterior tibial wound and dorsal foot wound. 3 layer compression. Follow-up in 1 week. Electronic Signature(s) Signed: 06/09/2022 1:36:47 PM By: Fredirick Maudlin MD FACS Entered By: Fredirick Maudlin on 06/09/2022 13:36:46 -------------------------------------------------------------------------------- HxROS Details Patient Name: Date of Service: OBRIAN, BULSON [name] 06/09/2022 12:30 PM Medical Record Number: [id number] Patient Account Number: [id number] Date of Birth/Sex: Treating RN: 03/30/1951 (71 y.o. M) Primary Care Provider: PA [name], NO Other Clinician: Referring Provider: Treating Provider/Extender: [name] in Treatment: 17 Information Obtained From Patient Chart Constitutional Symptoms (General Health) Medical History: Past Medical History Notes: obesity Eyes Medical History: Positive for: Cataracts - right eye removed Negative for: Glaucoma; Optic Neuritis Ear/Nose/Mouth/Throat Medical  History: Negative for: Chronic sinus problems/congestion; Middle ear problems Past Medical History Notes: hard of hearing Hematologic/Lymphatic Medical History: Positive for: Anemia - macrocytic; Lymphedema Respiratory Medical History: Past Medical History Notes: frozen diaphragm right Cardiovascular MICHA, DOSANJH ([id number]) [id number] Page 13 of 14 Medical History: Positive for: Congestive Heart Failure; Deep Vein Thrombosis - left leg; Hypertension; Peripheral Venous Disease Past Medical History Notes: hyperlipidemia Endocrine Medical History: Negative for: Type I Diabetes; Type II Diabetes Genitourinary Medical History: Negative for: End Stage Renal Disease Integumentary (Skin) Medical History: Negative for: History of Burn Musculoskeletal Medical History: Past Medical History Notes: myositis, lumbar DDD, spinal stenosis, cervical facet joint syndrome Oncologic Medical History: Negative for: Received Chemotherapy; Received Radiation Psychiatric Medical History: Negative for: Anorexia/bulimia; Confinement Anxiety HBO Extended History Items Eyes: Cataracts Immunizations Pneumococcal Vaccine: Received Pneumococcal Vaccination: Yes Received Pneumococcal Vaccination On or After 60th Birthday: Yes Implantable Devices No devices added Hospitalization / Surgery History Type of Hospitalization/Surgery cervical fusion lumbar surgery coronary stent placement lasik eye surgery hydrocele excision Family and Social History Cancer: Yes - Mother; Diabetes: No; Heart Disease: Yes - Father,Mother; Hereditary Spherocytosis: No; Hypertension: Yes - Father,Siblings; Kidney Disease: No; Lung Disease: Yes - Siblings; Seizures: No; Stroke: No; Thyroid Problems: No; Tuberculosis: No; Former smoker - quit 1991; Marital Status - Married; Alcohol Use: Moderate; Drug Use: No History; Caffeine Use: Daily - coffee; Financial Concerns: No; Food, Clothing or  Shelter Needs: No; Support System Lacking: No; Transportation Concerns: No Electronic Signature(s) Signed: 06/09/2022 5:18:05 PM By: Fredirick Maudlin MD FACS Entered By: Fredirick Maudlin on 06/09/2022 13:34:31 [name] ([id number]) [id number] Page 14 of 14 -------------------------------------------------------------------------------- SuperBill Details Patient Name: Date of Service: RHYTHM, WIGFALL 06/09/2022 Medical Record Number: [id number]  Patient Account Number: [id number] Date of Birth/Sex: Treating RN: Feb 04, 1951 (71 y.o. 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) I10 Essential (primary) hypertension I25.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 I89.0 Lymphedema, not elsewhere classified Z86.718 Personal history of other venous thrombosis and embolism Facility Procedures : CPT4 Code: [id number] Description: 03888 - DEB SUBQ TISSUE 20 SQ CM/< ICD-10 Diagnosis Description K80.034 Non-pressure chronic ulcer of other part of right lower leg with fat layer expos Modifier: ed Quantity: 1 : CPT4 Code: [id number] Description: 69794 - DEBRIDE WOUND 1ST 20 SQ CM OR < ICD-10 Diagnosis Description 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 L97.522 Non-pressure chronic ulcer of other part of left foot with fat layer  exposed Modifier: ed Quantity: 1 Physician Procedures : CPT4 Code Description Modifier [id number] - WC PHYS LEVEL 4 - EST PT 25 ICD-10 Diagnosis  Description 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d L97.522 Non-pressure chronic ulcer of other part of left  foot with fat layer exposed 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) Quantity: 1 : [id number] 11042 - WC PHYS SUBQ TISS 20 SQ CM ICD-10 Diagnosis Description 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d Quantity: 1 : [id number] 71219 - WC PHYS DEBR WO ANESTH 20 SQ CM ICD-10 Diagnosis Description X58.832 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

## 2022-06-16 ENCOUNTER — Encounter (HOSPITAL_BASED_OUTPATIENT_CLINIC_OR_DEPARTMENT_OTHER): Payer: Medicare Other | Admitting: General Surgery

## 2022-06-16 DIAGNOSIS — L97522 Non-pressure chronic ulcer of other part of left foot with fat layer exposed: Secondary | ICD-10-CM | POA: Diagnosis not present

## 2022-06-16 DIAGNOSIS — L97812 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d: Secondary | ICD-10-CM | POA: Diagnosis not present

## 2022-06-16 DIAGNOSIS — I110 Hypertensive heart disease with heart failure: Secondary | ICD-10-CM | POA: Diagnosis not present

## 2022-06-16 DIAGNOSIS — I890 Lymphedema, not elsewhere classified: Secondary | ICD-10-CM | POA: Diagnosis not present

## 2022-06-16 DIAGNOSIS — I872 Venous insufficiency (chronic) (peripheral): Secondary | ICD-10-CM | POA: Diagnosis not present

## 2022-06-16 DIAGNOSIS — L942 Calcinosis cutis: Secondary | ICD-10-CM | POA: Diagnosis not present

## 2022-06-16 DIAGNOSIS — I251 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: Secondary | ICD-10-CM | POA: Diagnosis not present

## 2022-06-16 NOTE — Progress Notes (Signed)
[name], [name] ([id number]) [id number] Page 1 of 11 Visit Report for 06/16/2022 Arrival Information Details Patient Name: Date of Service: [name], [name] 06/16/2022 12:30 PM Medical Record Number: [id number] Patient Account Number: [id number] Date of Birth/Sex: Treating RN: 10-21-1950 (71 y.o. [name] Primary Care Jayce Boyko: PA [name], [place] Other Clinician: Referring Jerran Tappan: Treating Zalayah Pizzuto/Extender: [name] in Treatment: 18 Visit Information History Since Last Visit Added or deleted any medications: No Patient Arrived: Wheel Chair Any new allergies or adverse reactions: No Arrival Time: 12:48 Had a fall or experienced change in No Accompanied By: self activities of daily living that may affect Transfer Assistance: None risk of falls: Patient Identification Verified: Yes Signs or symptoms of abuse/neglect since last visito No Secondary Verification Process Completed: Yes Hospitalized since last visit: No Patient Requires Transmission-Based Precautions: No Implantable device outside of the clinic excluding No Patient Has Alerts: Yes cellular tissue based products placed in the center Patient Alerts: R ABI= N/C, TBI= .75 since last visit: L ABI =N/C, TBI = .57 Has Dressing in Place as Prescribed: Yes Has Compression in Place as Prescribed: Yes Pain Present Now: Yes Electronic Signature(s) Signed: 06/16/2022 3:43:48 PM By: Baruch Gouty RN, BSN Entered By: Baruch Gouty on 06/16/2022 12:48:35 -------------------------------------------------------------------------------- Compression Therapy Details Patient Name: Date of Service: [name], [name] 06/16/2022 12:30 PM Medical Record Number: [id number] Patient Account Number: [id number] Date of Birth/Sex: Treating RN: 08-17-1950 (71 y.o. [name] Primary Care Shaneca Orne: PA [name], [place] Other Clinician: Referring Nyisha Clippard: Treating Evanell Redlich/Extender: [name] in Treatment: 18 Compression Therapy Performed for Wound Assessment: Wound #1 Left,Dorsal Foot Performed By: Clinician Baruch Gouty, RN Compression Type: Three Layer Post Procedure Diagnosis Same as Pre-procedure Electronic Signature(s) Signed: 06/16/2022 3:43:48 PM By: Baruch Gouty RN, BSN Entered By: Baruch Gouty on 06/16/2022 13:19:29 [name] ([id number]) [id number] Page 2 of 11 -------------------------------------------------------------------------------- Compression Therapy Details Patient Name: Date of Service: [name], [name] 06/16/2022 12:30 PM Medical Record Number: [id number] Patient Account Number: [id number] Date of Birth/Sex: Treating RN: September 16, 1950 (71 y.o. [name] Primary Care Jayma Volpi: PA [name], [place] Other Clinician: Referring Lalitha Ilyas: Treating Zahniya Zellars/Extender: [name] in Treatment: 18 Compression Therapy Performed for Wound Assessment: Wound #3 Right,Medial Lower Leg Performed By: Clinician Baruch Gouty, RN Compression Type: Three Layer Post Procedure Diagnosis Same as Pre-procedure Electronic Signature(s) Signed: 06/16/2022 3:43:48 PM By: Baruch Gouty RN, BSN Entered By: Baruch Gouty on 06/16/2022 13:19:29 -------------------------------------------------------------------------------- Encounter Discharge Information Details Patient Name: Date of Service: [name], [name] 06/16/2022 12:30 PM Medical Record Number: [id number] Patient Account Number: [id number] Date of Birth/Sex: Treating RN: Jan 21, 1951 (71 y.o. [name] Primary Care Toby Breithaupt: PA [name], [place] Other Clinician: Referring Taryn Nave: Treating Ciclaly Mulcahey/Extender: [name] in Treatment: 18 Encounter Discharge Information Items Post Procedure Vitals Discharge Condition: Stable Temperature (F): 98 Ambulatory Status: Wheelchair Pulse (bpm): 75 Discharge  Destination: Home Respiratory Rate (breaths/min): 18 Transportation: Private Auto Blood Pressure (mmHg): 160/80 Accompanied By: self Schedule Follow-up Appointment: Yes Clinical Summary of Care: Patient Declined Electronic Signature(s) Signed: 06/16/2022 3:43:48 PM By: Baruch Gouty RN, BSN Entered By: Baruch Gouty on 06/16/2022 13:42:47 -------------------------------------------------------------------------------- Lower Extremity Assessment Details Patient Name: Date of Service: [name], [name] 06/16/2022 12:30 PM Medical Record Number: [id number] Patient Account Number: [id number] Date of Birth/Sex: Treating RN: 05/05/1951 (71 y.o. [name] Primary Care Malu Pellegrini: PA [name], [place] Other Clinician: Referring Elyon Zoll: Treating Kjersten Ormiston/Extender: Valeria Batman Weeks in Treatment: 18 Edema Assessment Assessed: [Left:  No] [Right: No] Edema: [Left: Yes] [Right: Yes] Calf NIRAV, SWEDA ([id number]) [id number] Page 3 of 11 Left: Right: Point of Measurement: From Medial Instep 40.5 cm 43 cm Ankle Left: Right: Point of Measurement: From Medial Instep 25.5 cm 27 cm Vascular Assessment Pulses: Dorsalis Pedis Palpable: [Left:Yes] [Right:No] Electronic Signature(s) Signed: 06/16/2022 3:43:48 PM By: Baruch Gouty RN, BSN Entered By: Baruch Gouty on 06/16/2022 12:54:20 -------------------------------------------------------------------------------- Multi Wound Chart Details Patient Name: Date of Service: [name] 06/16/2022 12:30 PM Medical Record Number: [id number] Patient Account Number: [id number] Date of Birth/Sex: Treating RN: 06-29-1951 (71 y.o. M) Primary Care Jolinda Pinkstaff: PA [name], [place] Other Clinician: Referring Odessa Nishi: Treating Bethel Sirois/Extender: [name], [name] in Treatment: 18 Vital Signs Height(in): 71 Pulse(bpm): 75 Weight(lbs): 267 Blood Pressure(mmHg): 160/80 Body Mass Index(BMI):  37.2 Temperature(F): 98 Respiratory Rate(breaths/min): 20 [1:Photos:] Left, Dorsal Foot Right, Medial Lower Leg Right, Anterior Lower Leg Wound Location: Gradually Appeared Gradually Appeared Gradually Appeared Wounding Event: Lymphedema Lymphedema Calcinosis Primary Etiology: Cataracts, Anemia, Lymphedema, Cataracts, Anemia, Lymphedema, Cataracts, Anemia, Lymphedema, Comorbid History: Congestive Heart Failure, Deep Vein Congestive Heart Failure, Deep Vein Congestive Heart Failure, Deep Vein Thrombosis, Hypertension, Peripheral Thrombosis, Hypertension, Peripheral Thrombosis, Hypertension, Peripheral Venous Disease Venous Disease Venous Disease 12/07/2021 12/07/2021 05/12/2022 Date Acquired: '18 18 5 '$ Weeks of Treatment: Open Open Open Wound Status: No No No Wound Recurrence: 1.1x1.1x0.1 3.2x3.7x0.7 0.4x0.2x0.2 Measurements L x W x D (cm) 0.95 9.299 0.063 A (cm) : rea 0.095 6.509 0.013 Volume (cm) : 75.00% -74.10% 73.30% % Reduction in Area: 93.70% -204.70% 45.80% % Reduction in Volume: Full Thickness Without Exposed Full Thickness With Exposed Support Full Thickness Without Exposed Classification: Support Structures Structures Support Structures Medium Large Medium Exudate Amount: Serosanguineous Serosanguineous Serosanguineous Exudate Type: red, brown red, brown red, brown Exudate Color: No Yes No Foul Odor A Cleansing: fter N/A No N/A Odor Anticipated Due to Product UseKEATEN, MASHEK ([id number]) [id number] Page 4 of 11 Distinct, outline attached Distinct, outline attached Epibole Wound Margin: Small (1-33%) Medium (34-66%) Medium (34-66%) Granulation Amount: Red Red, Hyper-granulation Red Granulation Quality: Large (67-100%) Medium (34-66%) Medium (34-66%) Necrotic Amount: Fat Layer (Subcutaneous Tissue): Yes Fat Layer (Subcutaneous Tissue): Yes Fat Layer (Subcutaneous Tissue): Yes Exposed Structures: Fascia: No Fascia:  No Fascia: No Tendon: No Tendon: No Tendon: No Muscle: No Muscle: No Muscle: No Joint: No Joint: No Joint: No Bone: No Bone: No Bone: No Small (1-33%) Small (1-33%) None Epithelialization: Debridement - Selective/Open Wound Debridement - Excisional N/A Debridement: Pre-procedure Verification/Time Out 13:15 13:15 N/A Taken: Lidocaine 4% Topical Solution Lidocaine 4% Topical Solution N/A Pain Control: Slough Other, Subcutaneous, Slough N/A Tissue Debrided: Non-Viable Tissue Skin/Subcutaneous Tissue N/A Level: 1.21 11.84 N/A Debridement A (sq cm): rea Curette Curette N/A Instrument: Minimum Minimum N/A Bleeding: Pressure Pressure N/A Hemostasis A chieved: 2 5 N/A Procedural Pain: 0 3 N/A Post Procedural Pain: Procedure was tolerated well Procedure was tolerated well N/A Debridement Treatment Response: 1.1x1.1x0.1 3.2x3.7x0.7 N/A Post Debridement Measurements L x W x D (cm) 0.095 6.509 N/A Post Debridement Volume: (cm) Rash: Yes Excoriation: Yes No Abnormalities Noted Periwound Skin Texture: No Abnormalities Noted No Abnormalities Noted No Abnormalities Noted Periwound Skin Moisture: Erythema: Yes Hemosiderin Staining: Yes Hemosiderin Staining: Yes Periwound Skin Color: Erythema: No Decreased N/A N/A Erythema Change: No Abnormality No Abnormality No Abnormality Temperature: Yes Yes Yes Tenderness on Palpation: Compression Therapy Compression Therapy N/A Procedures Performed: Debridement Debridement Treatment Notes Electronic Signature(s) Signed: 06/16/2022 1:31:16 PM By: Fredirick Maudlin MD FACS Entered By: Fredirick Maudlin on 06/16/2022  13:31:15 -------------------------------------------------------------------------------- Multi-Disciplinary Care Plan Details Patient Name: Date of Service: IRELAND, CHAGNON 06/16/2022 12:30 PM Medical Record Number: [id number] Patient Account Number: [id number] Date of Birth/Sex: Treating RN: 05/05/1951 (71 y.o. [name] Primary Care Tiawana Forgy: PA [name], [place] Other Clinician: Referring Yaslin Kirtley: Treating Lavarr President/Extender: [name] in Treatment: 18 Multidisciplinary Care Plan reviewed with physician Active Inactive Venous Leg Ulcer Nursing Diagnoses: Knowledge deficit related to disease process and management Potential for venous Insuffiency (use before diagnosis confirmed) Goals: Patient will maintain optimal edema control Date Initiated: 02/17/2022 Target Resolution Date: 06/30/2022 Goal Status: Active Interventions: Assess peripheral edema status every visit. NEKO, BOYAJIAN ([id number]) [id number] Page 5 of 11 Compression as ordered Provide education on venous insufficiency Treatment Activities: Non-invasive vascular studies : 02/17/2022 Therapeutic compression applied : 02/17/2022 Notes: Wound/Skin Impairment Nursing Diagnoses: Impaired tissue integrity Knowledge deficit related to ulceration/compromised skin integrity Goals: Patient/caregiver will verbalize understanding of skin care regimen Date Initiated: 02/17/2022 Target Resolution Date: 06/30/2022 Goal Status: Active Ulcer/skin breakdown will have a volume reduction of 30% by week 4 Date Initiated: 02/17/2022 Date Inactivated: 03/10/2022 Target Resolution Date: 03/10/2022 Unmet Reason: infection being treated, Goal Status: Unmet waiting on derm appte Ulcer/skin breakdown will have a volume reduction of 50% by week 8 Date Initiated: 03/10/2022 Date Inactivated: 04/14/2022 Target Resolution Date: 04/07/2022 Goal Status: Unmet Unmet Reason: calcinosis Ulcer/skin breakdown will have a volume reduction of 80% by week 12 Date Initiated: 04/14/2022 Date Inactivated: 05/05/2022 Target Resolution Date: 05/05/2022 Unmet Reason: infection, chronic Goal Status: Unmet condition Interventions: Assess patient/caregiver ability to obtain necessary supplies Assess patient/caregiver  ability to perform ulcer/skin care regimen upon admission and as needed Assess ulceration(s) every visit Provide education on ulcer and skin care Treatment Activities: Skin care regimen initiated : 02/17/2022 Topical wound management initiated : 02/17/2022 Notes: Electronic Signature(s) Signed: 06/16/2022 3:43:48 PM By: Baruch Gouty RN, BSN Entered By: Baruch Gouty on 06/16/2022 13:12:24 -------------------------------------------------------------------------------- Pain Assessment Details Patient Name: Date of Service: TORRIE, LAFAVOR 06/16/2022 12:30 PM Medical Record Number: [id number] Patient Account Number: [id number] Date of Birth/Sex: Treating RN: April 05, 1951 (71 y.o. [name] Primary Care Amanda Pote: PA [name], [place] Other Clinician: Referring Aniqa Hare: Treating Makenzie Weisner/Extender: [name] in Treatment: 18 Active Problems Location of Pain Severity and Description of Pain Patient Has Paino Yes Site Locations Pain LocationBERWYN, BIGLEY ([id number]) [id number] Page 6 of 11 Pain Location: Pain in Ulcers With Dressing Change: Yes Duration of the Pain. Constant / Intermittento Intermittent Rate the pain. Current Pain Level: 2 Worst Pain Level: 3 Least Pain Level: 0 Character of Pain Describe the Pain: Aching, Tender Pain Management and Medication Current Pain Management: Medication: Yes Is the Current Pain Management Adequate: Adequate How does your wound impact your activities of daily livingo Sleep: No Bathing: No Appetite: No Relationship With Others: No Bladder Continence: No Emotions: No Bowel Continence: No Work: No Toileting: No Drive: No Dressing: No Hobbies: No Electronic Signature(s) Signed: 06/16/2022 3:43:48 PM By: Baruch Gouty RN, BSN Entered By: Baruch Gouty on 06/16/2022 12:49:34 -------------------------------------------------------------------------------- Patient/Caregiver  Education Details Patient Name: Date of Service: SHIELDS, PAUTZ 11/22/2023andnbsp12:30 PM Medical Record Number: [id number] Patient Account Number: [id number] Date of Birth/Gender: Treating RN: January 29, 1951 (71 y.o. [name] Primary Care Physician: PA [name], [place] Other Clinician: Referring Physician: Treating Physician/Extender: [name] in Treatment: 18 Education Assessment Education Provided To: Patient Education Topics Provided Venous: Methods: Explain/Verbal Responses: Reinforcements needed, State content correctly Wound/Skin Impairment: Methods: Explain/Verbal Responses: Reinforcements  needed, State content correctly Electronic Signature(s) Signed: 06/16/2022 3:43:48 PM By: Baruch Gouty RN, BSN Aspers, [name] ([id number]) [id number] Page 7 of 11 Entered By: Baruch Gouty on 06/16/2022 13:12:42 -------------------------------------------------------------------------------- Wound Assessment Details Patient Name: Date of Service: ROLLINS, WRIGHTSON 06/16/2022 12:30 PM Medical Record Number: [id number] Patient Account Number: [id number] Date of Birth/Sex: Treating RN: Jul 01, 1951 (71 y.o. [name] Primary Care Tayshun Gappa: PA [name], [place] Other Clinician: Referring Harmon Bommarito: Treating Lasharon Dunivan/Extender: [name] in Treatment: 18 Wound Status Wound Number: 1 Primary Lymphedema Etiology: Wound Location: Left, Dorsal Foot Wound Open Wounding Event: Gradually Appeared Status: Date Acquired: 12/07/2021 Comorbid Cataracts, Anemia, Lymphedema, Congestive Heart Failure, Deep Weeks Of Treatment: 18 History: Vein Thrombosis, Hypertension, Peripheral Venous Disease Clustered Wound: No Photos Wound Measurements Length: (cm) 1.1 Width: (cm) 1.1 Depth: (cm) 0.1 Area: (cm) 0.95 Volume: (cm) 0.095 % Reduction in Area: 75% % Reduction in Volume: 93.7% Epithelialization: Small  (1-33%) Tunneling: No Undermining: No Wound Description Classification: Full Thickness Without Exposed Suppor Wound Margin: Distinct, outline attached Exudate Amount: Medium Exudate Type: Serosanguineous Exudate Color: red, brown t Structures Foul Odor After Cleansing: No Slough/Fibrino Yes Wound Bed Granulation Amount: Small (1-33%) Exposed Structure Granulation Quality: Red Fascia Exposed: No Necrotic Amount: Large (67-100%) Fat Layer (Subcutaneous Tissue) Exposed: Yes Necrotic Quality: Adherent Slough Tendon Exposed: No Muscle Exposed: No Joint Exposed: No Bone Exposed: No Periwound Skin Texture Texture Color No Abnormalities Noted: Yes No Abnormalities Noted: Yes Erythema Change: Decreased Moisture No Abnormalities Noted: Yes Temperature / Pain Temperature: No Abnormality Tenderness on Palpation: Yes Treatment Notes AMEIR, FARIA ([id number]) [id number] Page 8 of 11 Wound #1 (Foot) Wound Laterality: Dorsal, Left Cleanser Peri-Wound Care Sween Lotion (Moisturizing lotion) Discharge Instruction: Apply moisturizing lotion as directed Topical Primary Dressing KerraCel Ag Gelling Fiber Dressing, 4x5 in (silver alginate) Discharge Instruction: Apply silver alginate to wound bed as instructed Secondary Dressing Woven Gauze Sponge, Non-Sterile 4x4 in Discharge Instruction: Apply over primary dressing as directed. Secured With Compression Wrap ThreePress (3 layer compression wrap) Discharge Instruction: Apply three layer compression as directed. Compression Stockings Add-Ons Electronic Signature(s) Signed: 06/16/2022 3:43:48 PM By: Baruch Gouty RN, BSN Entered By: Baruch Gouty on 06/16/2022 13:07:17 -------------------------------------------------------------------------------- Wound Assessment Details Patient Name: Date of Service: TERRE, ZABRISKIE 06/16/2022 12:30 PM Medical Record Number: [id number] Patient Account Number:  [id number] Date of Birth/Sex: Treating RN: 23-Nov-1950 (71 y.o. [name] Primary Care Jennea Rager: PA [name], [place] Other Clinician: Referring Kamaury Cutbirth: Treating Margret Moat/Extender: [name] in Treatment: 18 Wound Status Wound Number: 3 Primary Lymphedema Etiology: Wound Location: Right, Medial Lower Leg Wound Open Wounding Event: Gradually Appeared Status: Date Acquired: 12/07/2021 Comorbid Cataracts, Anemia, Lymphedema, Congestive Heart Failure, Deep Weeks Of Treatment: 18 History: Vein Thrombosis, Hypertension, Peripheral Venous Disease Clustered Wound: No Photos Wound Measurements Length: (cm) 3.2 Width: (cm) 3.7 Depth: (cm) 0.7 Area: (cm) 9.299 Charles, Wadell ([id number]) Volume: (cm) 6.509 % Reduction in Area: -74.1% % Reduction in Volume: -204.7% Epithelialization: Small (1-33%) Tunneling: No [id number] Page 9 of 11 Undermining: No Wound Description Classification: Full Thickness With Exposed Suppo Wound Margin: Distinct, outline attached Exudate Amount: Large Exudate Type: Serosanguineous Exudate Color: red, brown rt Structures Foul Odor After Cleansing: Yes Due to Product Use: No Slough/Fibrino Yes Wound Bed Granulation Amount: Medium (34-66%) Exposed Structure Granulation Quality: Red, Hyper-granulation Fascia Exposed: No Necrotic Amount: Medium (34-66%) Fat Layer (Subcutaneous Tissue) Exposed: Yes Necrotic Quality: Adherent Slough Tendon Exposed: No Muscle Exposed: No Joint Exposed: No Bone Exposed: No Periwound Skin Texture  Texture Color No Abnormalities Noted: Yes No Abnormalities Noted: No Erythema: No Moisture Hemosiderin Staining: Yes No Abnormalities Noted: Yes Temperature / Pain Temperature: No Abnormality Tenderness on Palpation: Yes Treatment Notes Wound #3 (Lower Leg) Wound Laterality: Right, Medial Cleanser Peri-Wound Care Sween Lotion (Moisturizing lotion) Discharge  Instruction: Apply moisturizing lotion as directed Topical Primary Dressing Medline Woven Gauze Sponges 4x4 (in/in) Discharge Instruction: pack into wound anasept Discharge Instruction: moisten gauze with anasept gel and pack into wound Secondary Dressing ABD Pad, 8x10 Discharge Instruction: Apply over primary dressing as directed. Woven Gauze Sponge, Non-Sterile 4x4 in Discharge Instruction: Apply over primary dressing as directed. Zetuvit Plus 4x8 in Discharge Instruction: Apply over primary dressing as directed. Secured With Compression Wrap ThreePress (3 layer compression wrap) Discharge Instruction: Apply three layer compression as directed. Compression Stockings Add-Ons Electronic Signature(s) Signed: 06/16/2022 3:43:48 PM By: Baruch Gouty RN, BSN Entered By: Baruch Gouty on 06/16/2022 13:07:57 [name] ([id number]) [id number] Page 10 of 11 -------------------------------------------------------------------------------- Wound Assessment Details Patient Name: Date of Service: KAMAAL, CAST 06/16/2022 12:30 PM Medical Record Number: [id number] Patient Account Number: [id number] Date of Birth/Sex: Treating RN: 13-Jan-1951 (71 y.o. [name] Primary Care Shuntell Foody: PA [name], [place] Other Clinician: Referring Keli Buehner: Treating Noelene Gang/Extender: [name] in Treatment: 18 Wound Status Wound Number: 6 Primary Calcinosis Etiology: Wound Location: Right, Anterior Lower Leg Wound Open Wounding Event: Gradually Appeared Status: Date Acquired: 05/12/2022 Comorbid Cataracts, Anemia, Lymphedema, Congestive Heart Failure, Deep Weeks Of Treatment: 5 History: Vein Thrombosis, Hypertension, Peripheral Venous Disease Clustered Wound: No Photos Wound Measurements Length: (cm) 0.4 Width: (cm) 0.2 Depth: (cm) 0.2 Area: (cm) 0.063 Volume: (cm) 0.013 % Reduction in Area: 73.3% % Reduction in Volume:  45.8% Epithelialization: None Tunneling: No Undermining: No Wound Description Classification: Full Thickness Without Exposed Suppor Wound Margin: Epibole Exudate Amount: Medium Exudate Type: Serosanguineous Exudate Color: red, brown t Structures Foul Odor After Cleansing: No Slough/Fibrino Yes Wound Bed Granulation Amount: Medium (34-66%) Exposed Structure Granulation Quality: Red Fascia Exposed: No Necrotic Amount: Medium (34-66%) Fat Layer (Subcutaneous Tissue) Exposed: Yes Necrotic Quality: Adherent Slough Tendon Exposed: No Muscle Exposed: No Joint Exposed: No Bone Exposed: No Periwound Skin Texture Texture Color No Abnormalities Noted: Yes No Abnormalities Noted: No Hemosiderin Staining: Yes Moisture No Abnormalities Noted: Yes Temperature / Pain Temperature: No Abnormality Tenderness on Palpation: Yes Treatment Notes Wound #6 (Lower Leg) Wound Laterality: Right, Anterior Cleanser SANTANNA, OLENIK ([id number]) [id number] Page 11 of 11 Peri-Wound Care Sween Lotion (Moisturizing lotion) Discharge Instruction: Apply moisturizing lotion as directed Topical Primary Dressing Medline Woven Gauze Sponges 4x4 (in/in) Discharge Instruction: pack into wound anasept Discharge Instruction: moisten gauze with anasept gel and pack into wound Secondary Dressing ABD Pad, 8x10 Discharge Instruction: Apply over primary dressing as directed. Woven Gauze Sponge, Non-Sterile 4x4 in Discharge Instruction: Apply over primary dressing as directed. Zetuvit Plus 4x8 in Discharge Instruction: Apply over primary dressing as directed. Secured With Compression Wrap ThreePress (3 layer compression wrap) Discharge Instruction: Apply three layer compression as directed. Compression Stockings Add-Ons Electronic Signature(s) Signed: 06/16/2022 3:43:48 PM By: Baruch Gouty RN, BSN Entered By: Baruch Gouty on 06/16/2022  13:09:02 -------------------------------------------------------------------------------- Vitals Details Patient Name: Date of Service: [name] 06/16/2022 12:30 PM Medical Record Number: [id number] Patient Account Number: [id number] Date of Birth/Sex: Treating RN: 06/30/1951 (71 y.o. [name] Primary Care Korie Brabson: PA [name], [place] Other Clinician: Referring Keymiah Lyles: Treating Charnetta Wulff/Extender: [name] in Treatment: 18 Vital Signs Time Taken: 12:48 Temperature (F): 98 Height (  in): 71 Pulse (bpm): 75 Weight (lbs): 267 Respiratory Rate (breaths/min): 20 Body Mass Index (BMI): 37.2 Blood Pressure (mmHg): 160/80 Reference Range: 80 - 120 mg / dl Electronic Signature(s) Signed: 06/16/2022 3:43:48 PM By: Baruch Gouty RN, BSN Entered By: Baruch Gouty on 06/16/2022 12:49:04

## 2022-06-17 NOTE — Progress Notes (Signed)
AIRON, SAHNI ([id number]) [id number] Page 1 of 14 Visit Report for 06/16/2022 Chief Complaint Document Details Patient Name: Date of Service: [name], [name] 06/16/2022 12:30 PM Medical Record Number: [id number] Patient Account Number: [id number] Date of Birth/Sex: Treating RN: 04/25/1951 (71 y.o. M) Primary Care Provider: PA [name], NO Other Clinician: Referring Provider: Treating Provider/Extender: [name] in Treatment: 18 Information Obtained from: Patient Chief Complaint Patient seen for complaints of Non-Healing Wounds. Electronic Signature(s) Signed: 06/16/2022 1:31:24 PM By: Fredirick Maudlin MD FACS Entered By: Fredirick Maudlin on 06/16/2022 13:31:24 -------------------------------------------------------------------------------- Debridement Details Patient Name: Date of Service: KENNER, LEWAN 06/16/2022 12:30 PM Medical Record Number: [id number] Patient Account Number: [id number] Date of Birth/Sex: Treating RN: 11-14-1950 (71 y.o. [name] Primary Care Provider: PA [name], [place] Other Clinician: Referring Provider: Treating Provider/Extender: [name] in Treatment: 18 Debridement Performed for Assessment: Wound #3 Right,Medial Lower Leg Performed By: Physician Fredirick Maudlin, MD Debridement Type: Debridement Level of Consciousness (Pre-procedure): Awake and Alert Pre-procedure Verification/Time Out Yes - 13:15 Taken: Start Time: 13:15 Pain Control: Lidocaine 4% T opical Solution T Area Debrided (L x W): otal 3.2 (cm) x 3.7 (cm) = 11.84 (cm) Tissue and other material debrided: Viable, Non-Viable, Slough, Subcutaneous, Slough, Other: calcium chunks Level: Skin/Subcutaneous Tissue Debridement Description: Excisional Instrument: Curette Bleeding: Minimum Hemostasis Achieved: Pressure Procedural Pain: 5 Post Procedural Pain: 3 Response to Treatment: Procedure was tolerated  well Level of Consciousness (Post- Awake and Alert procedure): Post Debridement Measurements of Total Wound Length: (cm) 3.2 Width: (cm) 3.7 Depth: (cm) 0.7 Volume: (cm) 6.509 Character of Wound/Ulcer Post Debridement: Improved Post Procedure Diagnosis Same as [name] ([id number]) [id number] Page 2 of 14 Notes scribed by Baruch Gouty, RN for Dr. [name] Electronic Signature(s) Signed: 06/16/2022 3:43:48 PM By: Baruch Gouty RN, BSN Signed: 06/16/2022 5:15:30 PM By: Fredirick Maudlin MD FACS Entered By: Baruch Gouty on 06/16/2022 13:19:40 -------------------------------------------------------------------------------- Debridement Details Patient Name: Date of Service: JAYEL, INKS 06/16/2022 12:30 PM Medical Record Number: [id number] Patient Account Number: [id number] Date of Birth/Sex: Treating RN: 1950/12/09 (71 y.o. [name] Primary Care Provider: PA [name], [place] Other Clinician: Referring Provider: Treating Provider/Extender: [name] in Treatment: 18 Debridement Performed for Assessment: Wound #1 Left,Dorsal Foot Performed By: Physician Fredirick Maudlin, MD Debridement Type: Debridement Level of Consciousness (Pre-procedure): Awake and Alert Pre-procedure Verification/Time Out Yes - 13:15 Taken: Start Time: 13:15 Pain Control: Lidocaine 4% T opical Solution T Area Debrided (L x W): otal 1.1 (cm) x 1.1 (cm) = 1.21 (cm) Tissue and other material debrided: Non-Viable, Slough, Slough Level: Non-Viable Tissue Debridement Description: Selective/Open Wound Instrument: Curette Bleeding: Minimum Hemostasis Achieved: Pressure Procedural Pain: 2 Post Procedural Pain: 0 Response to Treatment: Procedure was tolerated well Level of Consciousness (Post- Awake and Alert procedure): Post Debridement Measurements of Total Wound Length: (cm) 1.1 Width: (cm) 1.1 Depth: (cm) 0.1 Volume: (cm)  0.095 Character of Wound/Ulcer Post Debridement: Improved Post Procedure Diagnosis Same as Pre-procedure Notes scribed by Baruch Gouty, RN for Dr. [name] Electronic Signature(s) Signed: 06/16/2022 3:43:48 PM By: Baruch Gouty RN, BSN Signed: 06/16/2022 5:15:30 PM By: Fredirick Maudlin MD FACS Entered By: Baruch Gouty on 06/16/2022 13:20:27 HPI Details -------------------------------------------------------------------------------- [name] ([id number]) [id number] Page 3 of 14 Patient Name: Date of Service: AHREN, PETTINGER 06/16/2022 12:30 PM Medical Record Number: [id number] Patient Account Number: [id number] Date of Birth/Sex: Treating RN: 02-01-1951 (71 y.o. M) Primary Care Provider: PA [name], NO Other Clinician: Referring Provider: Treating Provider/Extender: [name],  Jewel Baize, [name] in Treatment: 18 History of Present Illness HPI Description: ADMISSION 02/09/2022 This is a 71 year old man who recently moved to [place] from [place]. He has a history of of coronary artery disease and prior left popliteal vein DVT . He is not diabetic and does not smoke. 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 He has had multiple spinal surgeries and has limited mobility. He primarily uses a motorized wheelchair. He has had lymphedema for many years and does have lymphedema pumps although he says he does not use them frequently. He has wounds on his bilateral lower extremities. He was receiving care in [place] for these prior to his move. They were apparently packing his wounds with Betadine soaked gauze. He has worn compression wraps in the past but not recently. He did say that they helped tremendously. In clinic today, his right ABI is noncompressible but has good signals; the left ABI was 1.17. No formal vascular studies have been performed. On his left dorsal foot, there is a circular lesion that exposes the fat layer. There is  fairly heavy slough accumulation within the wound, but no significant odor. On his right medial calf, he has multiple pinholes that have slough buildup in them and probe for about 2 to 4 mm in depth. They are draining clear fluid. On his right lateral midfoot, he has ulceration consistent with venous stasis. It is geographic and has slough accumulation. 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 02/17/2022: All of the wounds are little bit larger today and have more slough accumulation. Today, the medial calf openings are larger and probe to something that feels calcified. There is odor coming from his wounds. His vascular studies are scheduled for August 9 and 10. 02-24-2022 upon evaluation today patient presents for follow-up concerning his ongoing issues here with his lower extremities. With that being said he does have significant problems with what appears to be cellulitis unfortunately the PCR culture that was sent last week got crushed by [employer] in transit. With that being said we are going to reobtain a culture today although I am actually to do it on the right medial leg as this seems to be the most inflamed area today where there is some questionable drainage as well. I am going to remove some of the calcium deposits which are loosening obtain a deeper culture from this area. Fortunately I do not see any evidence of active infection systemically which is good news but locally I think we are having a bigger issue here. He does have his appointment next Thursday with vascular. Patient has also been referred to [facility] for further evaluation and treatment of the calcinosis for possible sulfate injections. 03/04/2022: The culture that was performed last week grew out multiple species including Klebsiella, Morganella, and Pseudomonas aeruginosa. He had been prescribed Bactrim but this was not adequate to cover  all of the species and levofloxacin was recommended. He completed the Bactrim but did not pick up the levofloxacin and begin taking it until yesterday. 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 He had arterial studies performed today. He does have evidence of peripheral arterial disease. Results copied here: +-------+-----------+-----------+------------+------------+ ABI/TBIT oday's ABIT oday's TBIPrevious ABIPrevious TBI +-------+-----------+-----------+------------+------------+ Right [place] 0.75    +-------+-----------+-----------+------------+------------+  Left [place] 0.57    +-------+-----------+-----------+------------+------------+ Arterial wall calcification precludes accurate ankle pressures and ABIs. Summary: Right: Resting right ankle-brachial index indicates noncompressible right lower extremity arteries. The right toe-brachial index is normal. Left: Resting left ankle-brachial index indicates noncompressible left lower extremity arteries. The left toe-brachial index is abnormal. He continues to have further tissue breakdown at the right medial calf and there is ample slough accumulation along with necrotic subcutaneous tissue. The left dorsal foot wound has built up slough, as well. The right medial foot wound is nearly closed with just a light layer of eschar over the surface. The right dorsal lateral foot wound has also accumulated a fair amount of slough and remains quite tender. 03/10/2022: He has been taking the prescribed levofloxacin and has about 3 days left of this. The erythema and induration on his right leg has improved. He continues to experience further breakdown of the right medial calf wound. There is extensive slough and debris accumulation there. There is heavy slough on his left dorsal  foot wound, but no odor or purulent drainage. The right medial foot wound appears to be closed. The right dorsal lateral foot wound also has a fair amount of slough but it is less tender than at our last visit. He still does not have an appointment with dermatology. 03/22/2022: The right anterior tibial wound has closed. The right lateral foot wound is nearly closed with just a little bit of slough. The left dorsal foot wound is smaller and continues to have some slough buildup but less so than at prior visits. There is good granulation tissue underlying the slough. Unfortunately the right medial calf wound continues to deteriorate. It has a foul odor today and a lot of necrotic tissue, the surrounding skin is also erythematous and moist. 03/30/2022: The right lateral foot wound continues to contract and just has a little bit of slough and eschar present. The left dorsal foot wound is also smaller with slough overlying good granulation tissue. The right medial calf wound actually looks quite a bit better today; there is no odor and there is much less necrotic tissue although some remains present. We have been using topical gentamicin and he has been taking oral Augmentin. 04/07/2022: The patient saw dermatology at [facility] last week. Unfortunately, it appears that they did not read any of the documentation that was provided. 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 Overall, it was a highly unsatisfactory consultation. In the meantime, however, we were able to identify compounding pharmacy who could create a topical sodium thiosulfate ointment. The patient has that with him today. Both of the right foot wounds are now closed. The left  dorsal foot wound has contracted but still has a layer of slough on the surface. The medial calf wounds on the right all look a little bit better. They still have heavy slough along with the chunks of calcium. Everything is stained purple. 04/14/2022: The wound on his dorsal left foot is a little bit smaller again today. There is still slough accumulation on the surface. The medial calf wounds have quite a bit less slough accumulation today. His right leg, however, is warm and erythematous, concerning for cellulitis. DAO, MEMMOTT ([id number]) [id number] Page 4 of 14 04/14/2022: He completed his  course of Augmentin yesterday. The medial calf wound sites are much cleaner today and shallower. There is less fragmented calcium in the wounds. He has a lot of lymphedema fluid-like drainage from these wounds, but no purulent discharge or malodor. The wound on his dorsal left foot has also contracted and is shallower. There is a layer of slough over healthy granulation tissue. 05/05/2022: The left dorsal foot wound is smaller today with less slough and more granulation tissue. The right medial calf wounds are shallower, but have extensive slough and some fat necrosis. The drainage has diminished considerably, however. 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 05/12/2022: The left dorsal foot wound continues to contract and fill with granulation tissue. There is slough on the wound surface. The right medial calf wounds also continue to fill with healthy tissue, but there is remaining slough and fat necrosis present. He had a significant increase in his drainage this week and it was a bright yellow-green color. He also had a new wound open over his right anterior tibial surface associated with the spike of cutaneous calcium. The leg is more red, as well, concerning for  infection. 05/19/2022: His culture returned positive for Pseudomonas. He is currently taking a course of oral levofloxacin. We have also been using topical gentamicin mixed in with his sodium thiosulfate. All of the wounds look better this week. 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 There is slough and nonviable subcutaneous tissue still present. The new anterior tibial wound just has a light layer of slough. The dorsal foot wound also has some slough present and appears a little dry today. 05/26/2022: The right medial leg wound continues to improve. It is feeling with granulation tissue, but still accumulates a fairly thick layer of slough on the surface. The more recent anterior tibial wound has remained quite small, but also has some slough on the surface. The left dorsal foot wound has contracted somewhat and has perimeter epithelialization. He completed his oral levofloxacin. 06/02/2022: The left dorsal foot wound continues to improve significantly. There is just a little slough on the wound surface. The right medial leg wound had black drainage, but it looks like silver alginate was applied last week and I theorized that silver sulfide was created with a combination of the silver alginate and the sodium thiosulfate. It did, however, have a bit of an odor to it and extensive slough accumulation. The small anterior tibial wound also had a bit of slough and nonviable subcutaneous tissue present. The skin of his leg was rather macerated, as well. 06/09/2022: The left dorsal foot wound is smaller again this week with just a light layer of slough on the surface. The right medial leg wound looks substantially better. The leg and periwound skin are no longer red and macerated. There is good granulation tissue forming with less slough and nonviable tissue. The anterior tibial wound just has a small amount of slough accumulation. 06/16/2022: The left dorsal foot wound  continues to contract. His wrap slipped a little bit, however, and his foot is more swollen. The right medial leg wound continues to improve. The underlying tissue is more vital-appearing and there is less slough. He does have substantial drainage. The small anterior tibial wound has a bit of slough accumulation. Electronic Signature(s) Signed: 06/16/2022 1:34:20 PM By: Fredirick Maudlin MD FACS Entered By: Fredirick Maudlin on 06/16/2022  13:34:20 -------------------------------------------------------------------------------- Physical Exam Details Patient Name: Date of Service: LYNFORD, ESPINOZA 06/16/2022 12:30 PM Medical Record Number: [id number] Patient Account Number: [id number] Date of Birth/Sex: Treating RN: Mar 07, 1951 (71 y.o. M) Primary Care Provider: PA [name], NO Other Clinician: Referring Provider: Treating Provider/Extender: [name] in Treatment: 18 Constitutional He is hypertensive, but asymptomatic.. . . . No acute distress. Respiratory Normal work of breathing on room air. Notes 06/16/2022: The left dorsal foot wound continues to contract. His wrap slipped a little bit, however, and his foot is more swollen. The right medial leg wound continues to improve. The underlying tissue is more vital-appearing and there is less slough. He does have substantial drainage. The small anterior tibial wound has a bit of slough accumulation. Electronic Signature(s) Signed: 06/16/2022 1:36:29 PM By: Fredirick Maudlin MD FACS Entered By: Fredirick Maudlin on 06/16/2022 13:36:29 [name] ([id number]) [id number] Page 5 of 14 -------------------------------------------------------------------------------- Physician Orders Details Patient Name: Date of Service: JAIMIN, KRUPKA 06/16/2022 12:30 PM Medical Record Number: [id number] Patient Account Number: [id number] Date of Birth/Sex: Treating RN: 1950/10/18 (71 y.o. 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 - Dr. [name] Rm 1 Return A Anesthetic Wound #1 Left,Dorsal Foot (In clinic) Topical Lidocaine 4% applied to wound bed Wound #3 Right,Medial Lower Leg (In clinic) Topical Lidocaine 4% applied to wound bed Wound #6 Right,Anterior Lower Leg (In clinic) Topical Lidocaine 4% applied to wound bed Bathing/ Shower/ Hygiene May shower with protection but do not get wound dressing(s) wet. Edema Control - Lymphedema / SCD / Other Lymphedema Pumps. Use Lymphedema pumps on leg(s) 2-3 times a day for 45-60 minutes. If wearing any wraps or hose, do not remove them. Continue exercising as instructed. - use 1-2 times per day over wraps Elevate legs to the level of the heart or above for 30 minutes daily and/or when sitting, a frequency of: Exercise regularly Wound Treatment Wound #1 - Foot Wound Laterality: Dorsal, Left Peri-Wound Care: Sween Lotion (Moisturizing lotion) 1 x Per Week Discharge Instructions: Apply moisturizing lotion as directed Prim Dressing: KerraCel Ag Gelling Fiber Dressing, 4x5 in (silver alginate) 1 x Per Week ary Discharge Instructions: Apply silver alginate to wound bed as instructed Secondary Dressing: Woven Gauze Sponge, Non-Sterile  4x4 in 1 x Per Week Discharge Instructions: Apply over primary dressing as directed. Compression Wrap: ThreePress (3 layer compression wrap) 1 x Per Week Discharge Instructions: Apply three layer compression as directed. Wound #3 - Lower Leg Wound Laterality: Right, Medial Peri-Wound Care: Sween Lotion (Moisturizing lotion) 1 x Per Week Discharge Instructions: Apply moisturizing lotion as directed Prim Dressing: Medline Woven Gauze Sponges 4x4 (in/in) ary 1 x Per Week Discharge Instructions: pack into wound Prim Dressing: anasept 1 x Per Week ary Discharge Instructions: moisten gauze with anasept gel and pack into wound Secondary Dressing: ABD Pad, 8x10 1 x Per Week Discharge Instructions: Apply over primary dressing as directed. Secondary Dressing: Woven  Gauze Sponge, Non-Sterile 4x4 in 1 x Per Week Discharge Instructions: Apply over primary dressing as directed. QUARON, DELACRUZ ([id number]) [id number] Page 6 of 14 Secondary Dressing: Zetuvit Plus 4x8 in 1 x Per Week Discharge Instructions: Apply over primary dressing as directed. Compression Wrap: ThreePress (3 layer compression wrap) 1 x Per Week Discharge Instructions: Apply three layer compression as directed. Wound #6 - Lower Leg Wound Laterality: Right, Anterior Peri-Wound Care: Sween Lotion (Moisturizing lotion) 1 x Per Week Discharge Instructions: Apply moisturizing lotion as directed Prim Dressing: Medline Woven Gauze Sponges 4x4 (in/in) ary 1 x Per Week Discharge Instructions: pack into wound Prim Dressing: anasept 1 x Per Week ary Discharge Instructions: moisten gauze with anasept gel and pack into wound Secondary Dressing: ABD Pad, 8x10 1 x Per Week Discharge Instructions: Apply over primary dressing as directed. Secondary Dressing: Woven Gauze Sponge, Non-Sterile 4x4 in 1 x Per Week Discharge Instructions: Apply over primary dressing as directed. Secondary Dressing: Zetuvit Plus 4x8 in 1 x Per  Week Discharge Instructions: Apply over primary dressing as directed. Compression Wrap: ThreePress (3 layer compression wrap) 1 x Per Week Discharge Instructions: Apply three layer compression as directed. Electronic Signature(s) Signed: 06/16/2022 5:15:30 PM By: Fredirick Maudlin MD FACS Entered By: Fredirick Maudlin on 06/16/2022 13:37:05 -------------------------------------------------------------------------------- Problem List Details Patient Name: Date of Service: MACKENZIE, LIA [name] 06/16/2022 12:30 PM Medical Record Number: [id number] Patient Account Number: [id number] Date of Birth/Sex: Treating RN: [birth year] (71 y.o. [name] Primary Care Provider: PA [name], [place] Other Clinician: Referring Provider: Treating Provider/Extender: [name] in Treatment: 18 Active Problems ICD-10 Encounter Code Description Active Date MDM Diagnosis L97.812 Non-pressure chronic ulcer of other part of right lower leg with fat layer 02/09/2022 No Yes exposed L97.522 Non-pressure chronic ulcer of other part of left foot with fat layer exposed 02/09/2022 No Yes 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 02/09/2022 No Yes 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) 02/09/2022 No Yes I10 Essential (primary) hypertension 02/09/2022 No Yes Pelto, Danel ([id number]) [id number] Page 7 of 14 I25.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 02/09/2022 No Yes I89.0 Lymphedema, not elsewhere classified 02/09/2022 No Yes Z86.718 Personal history of other venous thrombosis and embolism 02/09/2022 No Yes Inactive Problems ICD-10 Code Description Active Date Inactive Date L97.512 Non-pressure chronic ulcer of other part of right foot with fat layer exposed 02/17/2022 02/17/2022 Resolved Problems Electronic Signature(s) Signed: 06/16/2022 1:31:09 PM By: Fredirick Maudlin MD FACS Entered By: Fredirick Maudlin on 06/16/2022  13:31:09 -------------------------------------------------------------------------------- Progress Note Details Patient Name: Date of Service: [name] 06/16/2022 12:30 PM Medical Record Number: [id number] Patient Account Number: [id number] Date of Birth/Sex: Treating RN: 1950-10-09 (71 y.o. M) Primary Care Provider: PA [name], NO Other Clinician: Referring Provider: Treating Provider/Extender: [name] in Treatment: 18 Subjective Chief Complaint Information obtained from Patient Patient seen for complaints of Non-Healing Wounds. History of Present Illness (HPI) ADMISSION 02/09/2022 This is a 71 year old man who recently moved to [place] from [place]. He has a history of of coronary artery disease and prior left popliteal vein DVT . He is not diabetic and does not smoke. 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 He has had multiple spinal surgeries and has limited mobility. He primarily uses a motorized wheelchair. He has had lymphedema for many years and does have lymphedema pumps although he says he does not use them frequently. He has wounds on his bilateral lower extremities. He was receiving care in [place] for these  prior to his move. They were apparently packing his wounds with Betadine soaked gauze. He has worn compression wraps in the past but not recently. He did say that they helped tremendously. In clinic today, his right ABI is noncompressible but has good signals; the left ABI was 1.17. No formal vascular studies have been performed. On his left dorsal foot, there is a circular lesion that exposes the fat layer. There is fairly heavy slough accumulation within the wound, but no significant odor. On his right medial calf, he has multiple pinholes that have slough buildup in them and probe for about 2 to 4 mm in depth. They are draining clear fluid. On his right lateral midfoot, he has ulceration consistent with venous stasis.  It is geographic and has slough accumulation. 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 02/17/2022: All of the wounds are little bit larger today and have more slough accumulation. Today, the medial calf openings are larger and probe to something that feels calcified. There is odor coming from his wounds. His vascular studies are scheduled for August 9 and 10. 02-24-2022 upon evaluation today patient presents for follow-up concerning his ongoing issues here with his lower extremities. With that being said he does have significant problems with what appears to be cellulitis unfortunately the PCR culture that was sent last week got crushed by [employer] in transit. With that being said we are going to reobtain a culture today although I am actually to do it on the right medial leg as this seems to be the most inflamed area today where there is some questionable drainage as well. I am going to remove some of the calcium deposits which are loosening obtain a deeper culture from this area. Fortunately I do not see any evidence of active infection systemically which is good news but locally I think we are having a bigger issue here. He does have his appointment next Thursday with vascular. Patient has also been referred to [facility] for further evaluation and treatment of the calcinosis for possible sulfate injections. SHRAGA, CUSTARD ([id number]) [id number] Page 8 of 14 03/04/2022: The culture that was performed last week grew out multiple species including Klebsiella, Morganella, and Pseudomonas aeruginosa. He had been prescribed Bactrim but this was not adequate to cover all of the species and levofloxacin was recommended. He completed the Bactrim but did not pick up the levofloxacin and begin taking it until yesterday. 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 He had arterial studies performed today. He does have evidence of peripheral arterial disease. Results copied here: +-------+-----------+-----------+------------+------------+ ABI/TBIT oday's ABIT oday's TBIPrevious ABIPrevious TBI +-------+-----------+-----------+------------+------------+ Right [place] 0.75    +-------+-----------+-----------+------------+------------+ Left [place] 0.57    +-------+-----------+-----------+------------+------------+ Arterial wall calcification precludes accurate ankle pressures and ABIs. Summary: Right: Resting right ankle-brachial index indicates noncompressible right lower extremity arteries. The right toe-brachial index is normal. Left: Resting left ankle-brachial index indicates noncompressible left lower extremity arteries. The left toe-brachial index is abnormal. He continues to have further tissue breakdown at the right medial calf and there is ample slough accumulation along with necrotic subcutaneous tissue. The left dorsal foot wound has built up slough, as well. The right medial foot wound is nearly closed with just a light layer of eschar over the surface. The right dorsal lateral foot  wound has also accumulated a fair amount of slough and remains quite tender. 03/10/2022: He has been taking the prescribed levofloxacin and has about 3 days left of this. The erythema and induration on his right leg has improved. He continues to experience further breakdown of the right medial calf wound. There is extensive slough and debris accumulation there. There is heavy slough on his left dorsal foot wound, but no odor or purulent drainage. The right medial foot wound appears to be closed. The right dorsal lateral foot wound also has a fair amount of slough but it is less tender than at our last visit. He still does not  have an appointment with dermatology. 03/22/2022: The right anterior tibial wound has closed. The right lateral foot wound is nearly closed with just a little bit of slough. The left dorsal foot wound is smaller and continues to have some slough buildup but less so than at prior visits. There is good granulation tissue underlying the slough. Unfortunately the right medial calf wound continues to deteriorate. It has a foul odor today and a lot of necrotic tissue, the surrounding skin is also erythematous and moist. 03/30/2022: The right lateral foot wound continues to contract and just has a little bit of slough and eschar present. The left dorsal foot wound is also smaller with slough overlying good granulation tissue. The right medial calf wound actually looks quite a bit better today; there is no odor and there is much less necrotic tissue although some remains present. We have been using topical gentamicin and he has been taking oral Augmentin. 04/07/2022: The patient saw dermatology at [facility] last week. Unfortunately, it appears that they did not read any of the documentation that was provided. 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 Overall, it was a highly unsatisfactory consultation. In the meantime, however, we were able to identify compounding pharmacy who could create a topical sodium thiosulfate ointment. The patient has that with him today. Both of the right foot wounds are now closed. The left dorsal foot wound has contracted but still has a layer of slough on the surface. The medial calf wounds on the right all look a little bit better. They still have heavy slough along with the chunks of calcium. Everything is stained  purple. 04/14/2022: The wound on his dorsal left foot is a little bit smaller again today. There is still slough accumulation on the surface. The medial calf wounds have quite a bit less slough accumulation today. His right leg, however, is warm and erythematous, concerning for cellulitis. 04/14/2022: He completed his course of Augmentin yesterday. The medial calf wound sites are much cleaner today and shallower. There is less fragmented calcium in the wounds. He has a lot of lymphedema fluid-like drainage from these wounds, but no purulent discharge or malodor. The wound on his dorsal left foot has also contracted and is shallower. There is a layer of slough over healthy granulation tissue. 05/05/2022: The left dorsal foot wound is smaller today with less slough and more granulation tissue. The right medial calf wounds are shallower, but have extensive slough and some fat necrosis. The drainage has diminished considerably, however. 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 05/12/2022: The left dorsal foot wound continues to contract and fill with granulation tissue. There is slough on the wound surface. The right medial calf wounds also continue to fill with healthy tissue, but there is remaining slough and fat necrosis present. He had a significant increase in his drainage this week and it was a bright yellow-green color. He also had a new wound open over his right anterior tibial surface associated with the spike of cutaneous calcium. The leg is more red, as well, concerning for infection. 05/19/2022: His culture returned positive for Pseudomonas. He is currently taking a course of oral levofloxacin. We have also been using topical gentamicin mixed in with his sodium thiosulfate. All of the wounds look better this week. The ones associated with his calcinosis cutis have filled in  substantially. There is slough and nonviable subcutaneous tissue still present. The new anterior tibial wound just has a light layer of slough. The dorsal foot wound also has some slough present and appears a little dry today. 05/26/2022: The right medial leg wound continues to improve. It is feeling with granulation tissue, but still accumulates a fairly thick layer of slough on the surface. The more recent anterior tibial wound has remained quite small, but also has some slough on the surface. The left dorsal foot wound has contracted somewhat and has perimeter epithelialization. He completed his oral levofloxacin. 06/02/2022: The left dorsal foot wound continues to improve significantly. There is just a little slough on the wound surface. The right medial leg wound had black drainage, but it looks like silver alginate was applied last week and I theorized that silver sulfide was created with a combination of the silver alginate and the sodium thiosulfate. It did, however, have a bit of an odor to it and extensive slough accumulation. The small anterior tibial wound also had a bit of slough and nonviable subcutaneous tissue present. The skin of his leg was rather macerated, as well. 06/09/2022: The left dorsal foot wound is smaller again this week with just a light layer of slough on the surface. The right medial leg wound looks substantially better. The leg and periwound skin are no longer red and macerated. There is good granulation tissue forming with less slough and nonviable tissue. The anterior tibial wound just has a small amount of slough accumulation. 06/16/2022: The left dorsal foot wound continues to contract. His wrap slipped a little bit, however, and his foot is more swollen. The right medial leg wound continues to improve. The underlying tissue is more vital-appearing and there is less slough. He does have substantial drainage. The small anterior tibial wound has a bit of slough  accumulation. Patient History BENJI, POYNTER ([id number]) [id number] Page 9 of 14 Information obtained from Patient, Chart. Family History Cancer - Mother, Heart Disease - Father,Mother, Hypertension - Father,Siblings, Lung Disease - Siblings, No family history of Diabetes, Hereditary Spherocytosis, Kidney Disease, Seizures, Stroke, Thyroid Problems, Tuberculosis. Social History Former smoker - quit 1991, Marital Status - Married, Alcohol Use - Moderate, Drug Use - No History, Caffeine Use - Daily - coffee. Medical History Eyes Patient has history of Cataracts - right eye removed Denies history of Glaucoma, Optic Neuritis Ear/Nose/Mouth/Throat Denies history of Chronic sinus problems/congestion, Middle ear problems Hematologic/Lymphatic Patient has history of Anemia - macrocytic, Lymphedema Cardiovascular Patient has history of Congestive Heart Failure, Deep Vein Thrombosis - left leg, Hypertension, Peripheral Venous Disease Endocrine Denies history of Type I Diabetes,  Type II Diabetes Genitourinary Denies history of End Stage Renal Disease Integumentary (Skin) Denies history of History of Burn Oncologic Denies history of Received Chemotherapy, Received Radiation Psychiatric Denies history of Anorexia/bulimia, Confinement Anxiety Hospitalization/Surgery History - cervical fusion. - lumbar surgery. - coronary stent placement. - lasik eye surgery. - hydrocele excision. Medical A Surgical History Notes nd Constitutional Symptoms (General Health) obesity Ear/Nose/Mouth/Throat hard of hearing Respiratory frozen diaphragm right Cardiovascular hyperlipidemia Musculoskeletal myositis, lumbar DDD, spinal stenosis, cervical facet joint syndrome Objective Constitutional He is hypertensive, but asymptomatic.Marland Kitchen No acute distress. Vitals Time Taken: 12:48 PM, Height: 71 in, Weight: 267 lbs, BMI: 37.2, Temperature: 98 F, Pulse: 75 bpm, Respiratory Rate: 20  breaths/min, Blood Pressure: 160/80 mmHg. Respiratory Normal work of breathing on room air. General Notes: 06/16/2022: The left dorsal foot wound continues to contract. His wrap slipped a little bit, however, and his foot is more swollen. The right medial leg wound continues to improve. The underlying tissue is more vital-appearing and there is less slough. He does have substantial drainage. The small anterior tibial wound has a bit of slough accumulation. Integumentary (Hair, Skin) Wound #1 status is Open. Original cause of wound was Gradually Appeared. The date acquired was: 12/07/2021. The wound has been in treatment 18 weeks. The wound is located on the Left,Dorsal Foot. The wound measures 1.1cm length x 1.1cm width x 0.1cm depth; 0.95cm^2 area and 0.095cm^3 volume. There is Fat Layer (Subcutaneous Tissue) exposed. There is no tunneling or undermining noted. There is a medium amount of serosanguineous drainage noted. The wound margin is distinct with the outline attached to the wound base. There is small (1-33%) red granulation within the wound bed. There is a large (67-100%) amount of necrotic tissue within the wound bed including Adherent Slough. The periwound skin appearance had no abnormalities noted for texture. The periwound skin appearance had no abnormalities noted for moisture. The periwound skin appearance had no abnormalities noted for color. Periwound temperature was noted as No Abnormality. The periwound has tenderness on palpation. Wound #3 status is Open. Original cause of wound was Gradually Appeared. The date acquired was: 12/07/2021. The wound has been in treatment 18 weeks. The wound is located on the Right,Medial Lower Leg. The wound measures 3.2cm length x 3.7cm width x 0.7cm depth; 9.299cm^2 area and 6.509cm^3 volume. There is Fat Layer (Subcutaneous Tissue) exposed. There is no tunneling or undermining noted. There is a large amount of serosanguineous drainage noted. Foul  odor after cleansing was noted. The wound margin is distinct with the outline attached to the wound base. There is medium (34-66%) red, hyper - granulation within the wound bed. There is a medium (34-66%) amount of necrotic tissue within the wound bed including Adherent Slough. The periwound skin appearance had no abnormalities noted for texture. The periwound skin appearance had no abnormalities noted for moisture. The periwound skin appearance exhibited: Hemosiderin Staining. The periwound skin appearance did not exhibit: Erythema. Periwound temperature was noted as No Abnormality. The periwound has tenderness on palpation. Wound #6 status is Open. Original cause of wound was Gradually Appeared. The date acquired was: 05/12/2022. The wound has been in treatment 5 weeks. The wound is located on the Right,Anterior Lower Leg. The wound measures 0.4cm length x 0.2cm width x 0.2cm depth; 0.063cm^2 area and 0.013cm^3 GEORG, ANG ([id number]) [id number] Page 10 of 14 volume. There is Fat Layer (Subcutaneous Tissue) exposed. There is no tunneling or undermining noted. There is a medium amount of serosanguineous drainage noted. The wound margin is  epibole. There is medium (34-66%) red granulation within the wound bed. There is a medium (34-66%) amount of necrotic tissue within the wound bed including Adherent Slough. The periwound skin appearance had no abnormalities noted for texture. The periwound skin appearance had no abnormalities noted for moisture. The periwound skin appearance exhibited: Hemosiderin Staining. Periwound temperature was noted as No Abnormality. The periwound has tenderness on palpation. 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 There was a Selective/Open Wound Non-Viable Tissue Debridement with a total area of 1.21 sq cm performed by Fredirick Maudlin, MD. With the following instrument(s): Curette to remove Non-Viable tissue/material. Material removed includes [facility] and Other: calcium chunks after achieving pain control using Lidocaine 4% T opical Solution. No specimens were taken. A time out was conducted at 13:15, prior to the start of the procedure. A Minimum amount of bleeding was controlled with Pressure. The procedure was tolerated well with a pain level of 2 throughout and a pain level of 0 following the procedure. Post Debridement Measurements: 1.1cm length x 1.1cm width x 0.1cm depth; 0.095cm^3 volume. Character of Wound/Ulcer Post Debridement is improved. Post procedure Diagnosis Wound #1: Same as Pre-Procedure General Notes: scribed by Baruch Gouty, RN for Dr. [name]. Pre-procedure diagnosis of Wound #1 is a Lymphedema located on the Left,Dorsal Foot . There was a Three Layer Compression Therapy Procedure by Baruch Gouty, RN. Post procedure Diagnosis Wound #1: Same as Pre-Procedure Wound #3 Pre-procedure diagnosis of Wound #3 is a Lymphedema located on the Right,Medial Lower Leg . There was a Excisional Skin/Subcutaneous Tissue Debridement with a total area of 11.84 sq cm performed by Fredirick Maudlin, MD. With the following instrument(s): Curette to remove Viable and Non-Viable tissue/material. Material removed includes Subcutaneous Tissue, Slough, and Other: calcium chunks after achieving pain control using Lidocaine 4% T opical Solution. No specimens were taken. A time out was conducted at 13:15, prior  to the start of the procedure. A Minimum amount of bleeding was controlled with Pressure. The procedure was tolerated well with a pain level of 5 throughout and a pain level of 3 following the procedure. Post Debridement Measurements: 3.2cm length x 3.7cm width x 0.7cm depth; 6.509cm^3 volume. Character of Wound/Ulcer Post Debridement is improved. Post procedure Diagnosis Wound #3: Same as Pre-Procedure General Notes: scribed by Baruch Gouty, RN for Dr. [name]. Pre-procedure diagnosis of Wound #3 is a Lymphedema located on the Right,Medial Lower Leg . There was a Three Layer Compression Therapy Procedure by Baruch Gouty, RN. Post procedure Diagnosis Wound #3: Same as Pre-Procedure Plan Follow-up Appointments: Return Appointment in 1 week. - Dr. [name] Rm 1 Anesthetic: Wound #1 Left,Dorsal Foot: (In clinic) Topical Lidocaine 4% applied to wound bed Wound #3 Right,Medial Lower Leg: (In clinic) Topical Lidocaine 4% applied to wound bed Wound #6 Right,Anterior Lower Leg: (In clinic) Topical Lidocaine 4% applied to wound bed Bathing/ Shower/ Hygiene: May shower with protection but do not get wound dressing(s) wet. Edema Control - Lymphedema / SCD / Other: Lymphedema Pumps. Use Lymphedema pumps on leg(s) 2-3 times a day for 45-60 minutes. If wearing any wraps  or hose, do not remove them. Continue exercising as instructed. - use 1-2 times per day over wraps Elevate legs to the level of the heart or above for 30 minutes daily and/or when sitting, a frequency of: Exercise regularly WOUND #1: - Foot Wound Laterality: Dorsal, Left Peri-Wound Care: Sween Lotion (Moisturizing lotion) 1 x Per Week/ Discharge Instructions: Apply moisturizing lotion as directed Prim Dressing: KerraCel Ag Gelling Fiber Dressing, 4x5 in (silver alginate) 1 x Per Week/ ERMINE, SPOFFORD ([id number]) [id number] Page 11 of 14 Discharge Instructions: Apply silver alginate to wound bed as  instructed Secondary Dressing: Woven Gauze Sponge, Non-Sterile 4x4 in 1 x Per Week/ Discharge Instructions: Apply over primary dressing as directed. Com pression Wrap: ThreePress (3 layer compression wrap) 1 x Per Week/ Discharge Instructions: Apply three layer compression as directed. WOUND #3: - Lower Leg Wound Laterality: Right, Medial Peri-Wound Care: Sween Lotion (Moisturizing lotion) 1 x Per Week/ Discharge Instructions: Apply moisturizing lotion as directed Prim Dressing: Medline Woven Gauze Sponges 4x4 (in/in) 1 x Per Week/ ary Discharge Instructions: pack into wound Prim Dressing: anasept 1 x Per Week/ ary Discharge Instructions: moisten gauze with anasept gel and pack into wound Secondary Dressing: ABD Pad, 8x10 1 x Per Week/ Discharge Instructions: Apply over primary dressing as directed. Secondary Dressing: Woven Gauze Sponge, Non-Sterile 4x4 in 1 x Per Week/ Discharge Instructions: Apply over primary dressing as directed. Secondary Dressing: Zetuvit Plus 4x8 in 1 x Per Week/ Discharge Instructions: Apply over primary dressing as directed. Com pression Wrap: ThreePress (3 layer compression wrap) 1 x Per Week/ Discharge Instructions: Apply three layer compression as directed. WOUND #6: - Lower Leg Wound Laterality: Right, Anterior Peri-Wound Care: Sween Lotion (Moisturizing lotion) 1 x Per Week/ Discharge Instructions: Apply moisturizing lotion as directed Prim Dressing: Medline Woven Gauze Sponges 4x4 (in/in) 1 x Per Week/ ary Discharge Instructions: pack into wound Prim Dressing: anasept 1 x Per Week/ ary Discharge Instructions: moisten gauze with anasept gel and pack into wound Secondary Dressing: ABD Pad, 8x10 1 x Per Week/ Discharge Instructions: Apply over primary dressing as directed. Secondary Dressing: Woven Gauze Sponge, Non-Sterile 4x4 in 1 x Per Week/ Discharge Instructions: Apply over primary dressing as directed. Secondary Dressing: Zetuvit Plus 4x8 in 1 x  Per Week/ Discharge Instructions: Apply over primary dressing as directed. Com pression Wrap: ThreePress (3 layer compression wrap) 1 x Per Week/ Discharge Instructions: Apply three layer compression as directed. 06/16/2022: The left dorsal foot wound continues to contract. His wrap slipped a little bit, however, and his foot is more swollen. The right medial leg wound continues to improve. The underlying tissue is more vital-appearing and there is less slough. He does have substantial drainage. The small anterior tibial wound has a bit of slough accumulation. I used a curette to debride slough, nonviable subcutaneous tissue and chunks of calcium from the right medial leg wound. I debrided slough from the anterior tibial wound on the right and the dorsal foot wound on the left. He will continue silver alginate to the dorsal foot and right anterior tibia. He did not bring his sodium thiosulfate so we are going to pack the right medial leg wound with gauze saturated with Anasept gel. We will apply bilateral 3 layer compression. Follow-up in 1 week. Electronic Signature(s) Signed: 06/16/2022 1:39:10 PM By: Fredirick Maudlin MD FACS Entered By: Fredirick Maudlin on 06/16/2022 13:39:10 -------------------------------------------------------------------------------- HxROS Details Patient Name: Date of Service: [place], SCHUMM [name] 06/16/2022 12:30 PM Medical Record Number: [id number] Patient Account Number:  [id number] Date of Birth/Sex: Treating RN: Jun 26, 1951 (71 y.o. M) Primary Care Provider: PA [name], NO Other Clinician: Referring Provider: Treating Provider/Extender: [name] in Treatment: 18 Information Obtained From Patient Chart Constitutional Symptoms (General Health) Medical History: Past Medical History Notes: obesity Eyes Medical History: Positive for: Cataracts - right eye removed QUINTIN, HJORT ([id number]) [id number] Page 12 of  14 Negative for: Glaucoma; Optic Neuritis Ear/Nose/Mouth/Throat Medical History: Negative for: Chronic sinus problems/congestion; Middle ear problems Past Medical History Notes: hard of hearing Hematologic/Lymphatic Medical History: Positive for: Anemia - macrocytic; Lymphedema Respiratory Medical History: Past Medical History Notes: frozen diaphragm right Cardiovascular Medical History: Positive for: Congestive Heart Failure; Deep Vein Thrombosis - left leg; Hypertension; Peripheral Venous Disease Past Medical History Notes: hyperlipidemia Endocrine Medical History: Negative for: Type I Diabetes; Type II Diabetes Genitourinary Medical History: Negative for: End Stage Renal Disease Integumentary (Skin) Medical History: Negative for: History of Burn Musculoskeletal Medical History: Past Medical History Notes: myositis, lumbar DDD, spinal stenosis, cervical facet joint syndrome Oncologic Medical History: Negative for: Received Chemotherapy; Received Radiation Psychiatric Medical History: Negative for: Anorexia/bulimia; Confinement Anxiety HBO Extended History Items Eyes: Cataracts Immunizations Pneumococcal Vaccine: Received Pneumococcal Vaccination: Yes Received Pneumococcal Vaccination On or After 60th Birthday: Yes Implantable Devices No devices added Hospitalization / Surgery History Type of Hospitalization/Surgery cervical fusion lumbar surgery coronary stent placement lasik eye surgery EVERTON, BERTHA ([id number]) [id number] Page 13 of 14 hydrocele excision Family and Social History Cancer: Yes - Mother; Diabetes: No; Heart Disease: Yes - Father,Mother; Hereditary Spherocytosis: No; Hypertension: Yes - Father,Siblings; Kidney Disease: No; Lung Disease: Yes - Siblings; Seizures: No; Stroke: No; Thyroid Problems: No; Tuberculosis: No; Former smoker - quit 1991; Marital Status - Married; Alcohol Use: Moderate; Drug Use: No History;  Caffeine Use: Daily - coffee; Financial Concerns: No; Food, Clothing or Shelter Needs: No; Support System Lacking: No; Transportation Concerns: No Electronic Signature(s) Signed: 06/16/2022 5:15:30 PM By: Fredirick Maudlin MD FACS Entered By: Fredirick Maudlin on 06/16/2022 13:34:27 -------------------------------------------------------------------------------- SuperBill Details Patient Name: Date of Service: QUAY, SIMKIN 06/16/2022 Medical Record Number: [id number] Patient Account Number: [id number] Date of Birth/Sex: Treating RN: Dec 06, 1950 (71 y.o. 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) I10 Essential (primary) hypertension I25.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 I89.0 Lymphedema, not elsewhere classified Z86.718 Personal history of other venous thrombosis and embolism Facility Procedures : CPT4 Code: [id number] Description: 11042 - DEB SUBQ TISSUE 20 SQ CM/< ICD-10 Diagnosis Description 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 Modifier: ed Quantity: 1 : CPT4 Code: [id number] Description: 90240 - DEBRIDE WOUND 1ST 20 SQ CM OR < ICD-10 Diagnosis Description 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 L97.522 Non-pressure chronic ulcer of other part of left foot with fat layer  exposed Modifier: ed Quantity: 1 Physician Procedures : CPT4 Code Description Modifier [id number] - WC PHYS LEVEL 4 - EST PT 25 ICD-10  Diagnosis Description 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d L97.522 Non-pressure chronic ulcer of other part of left  foot with fat layer exposed 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) Quantity: 1 : [id number] 11042 - WC PHYS SUBQ TISS 20 SQ CM ICD-10 Diagnosis Description 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

## 2022-06-19 ENCOUNTER — Encounter: Payer: Self-pay | Admitting: Internal Medicine

## 2022-06-23 ENCOUNTER — Encounter (HOSPITAL_BASED_OUTPATIENT_CLINIC_OR_DEPARTMENT_OTHER): Payer: Medicare Other | Admitting: General Surgery

## 2022-06-23 ENCOUNTER — Ambulatory Visit: Payer: Medicare Other | Attending: Internal Medicine

## 2022-06-23 DIAGNOSIS — E785 Hyperlipidemia, unspecified: Secondary | ICD-10-CM

## 2022-06-23 DIAGNOSIS — I872 Venous insufficiency (chronic) (peripheral): Secondary | ICD-10-CM | POA: Diagnosis not present

## 2022-06-23 DIAGNOSIS — L97812 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d: Secondary | ICD-10-CM | POA: Diagnosis not present

## 2022-06-23 DIAGNOSIS — I890 Lymphedema, not elsewhere classified: Secondary | ICD-10-CM | POA: Diagnosis not present

## 2022-06-23 DIAGNOSIS — Z79899 Other long term (current) drug therapy: Secondary | ICD-10-CM | POA: Diagnosis not present

## 2022-06-23 DIAGNOSIS — L942 Calcinosis cutis: Secondary | ICD-10-CM | POA: Diagnosis not present

## 2022-06-23 DIAGNOSIS — L97522 Non-pressure chronic ulcer of other part of left foot with fat layer exposed: Secondary | ICD-10-CM | POA: Diagnosis not present

## 2022-06-23 DIAGNOSIS — I251 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: Secondary | ICD-10-CM | POA: Diagnosis not present

## 2022-06-23 DIAGNOSIS — I110 Hypertensive heart disease with heart failure: Secondary | ICD-10-CM | POA: Diagnosis not present

## 2022-06-23 NOTE — Progress Notes (Signed)
[name], [name] ([id number]) [id number] Page 1 of 13 Visit Report for 06/23/2022 Arrival Information Details Patient Name: Date of Service: [name], [name] 06/23/2022 12:30 PM Medical Record Number: [id number] Patient Account Number: [id number] Date of Birth/Sex: Treating RN: 1950/10/08 (71 y.o. [name] Primary Care Sahan Pen: PA [name], [place] Other Clinician: Referring Monty Mccarrell: Treating Mizuki Hoel/Extender: [name] in Treatment: 19 Visit Information History Since Last Visit Added or deleted any medications: No Patient Arrived: Wheel Chair Any new allergies or adverse reactions: No Arrival Time: 12:42 Had a fall or experienced change in No Accompanied By: self activities of daily living that may affect Transfer Assistance: None risk of falls: Patient Identification Verified: Yes Signs or symptoms of abuse/neglect since last visito No Secondary Verification Process Completed: Yes Hospitalized since last visit: No Patient Requires Transmission-Based Precautions: No Implantable device outside of the clinic excluding No Patient Has Alerts: Yes cellular tissue based products placed in the center Patient Alerts: R ABI= N/C, TBI= .75 since last visit: L ABI =N/C, TBI = .57 Has Dressing in Place as Prescribed: Yes Has Compression in Place as Prescribed: Yes Pain Present Now: No Electronic Signature(s) Signed: 06/23/2022 4:43:07 PM By: Sharyn Creamer RN, BSN Entered By: Sharyn Creamer on 06/23/2022 12:43:02 -------------------------------------------------------------------------------- Compression Therapy Details Patient Name: Date of Service: [name], [name] 06/23/2022 12:30 PM Medical Record Number: [id number] Patient Account Number: [id number] Date of Birth/Sex: Treating RN: [birth year] (71 y.o. [name] Primary Care Kahlyn Shippey: PA [name], [place] Other Clinician: Referring Idania Desouza: Treating Dannilynn Gallina/Extender: [name] in Treatment: 19 Compression Therapy Performed for Wound Assessment: Wound #3 Right,Medial Lower Leg Performed By: Clinician Sharyn Creamer, RN Compression Type: Three Layer Post Procedure Diagnosis Same as Pre-procedure Electronic Signature(s) Signed: 06/23/2022 4:43:07 PM By: Sharyn Creamer RN, BSN Entered By: Sharyn Creamer on 06/23/2022 13:18:02 [name] ([id number]) [id number] Page 2 of 13 -------------------------------------------------------------------------------- Compression Therapy Details Patient Name: Date of Service: [name], [name] 06/23/2022 12:30 PM Medical Record Number: [id number] Patient Account Number: [id number] Date of Birth/Sex: Treating RN: Apr 08, 1951 (71 y.o. [name] Primary Care Cieanna Stormes: PA [name], [place] Other Clinician: Referring Navaya Wiatrek: Treating Amer Alcindor/Extender: [name] in Treatment: 19 Compression Therapy Performed for Wound Assessment: Wound #1 Left,Dorsal Foot Performed By: Clinician Sharyn Creamer, RN Compression Type: Three Layer Post Procedure Diagnosis Same as Pre-procedure Electronic Signature(s) Signed: 06/23/2022 4:43:07 PM By: Sharyn Creamer RN, BSN Entered By: Sharyn Creamer on 06/23/2022 13:18:02 -------------------------------------------------------------------------------- Compression Therapy Details Patient Name: Date of Service: [name], [name] 06/23/2022 12:30 PM Medical Record Number: [id number] Patient Account Number: [id number] Date of Birth/Sex: Treating RN: October 09, 1950 (71 y.o. [name] Primary Care Genice Kimberlin: PA [name], [place] Other Clinician: Referring Junella Domke: Treating Delrico Minehart/Extender: [name] in Treatment: 19 Compression Therapy Performed for Wound Assessment: Wound #6 Right,Anterior Lower Leg Performed By: Clinician Sharyn Creamer, RN Compression Type: Three Layer Post Procedure  Diagnosis Same as Pre-procedure Electronic Signature(s) Signed: 06/23/2022 4:43:07 PM By: Sharyn Creamer RN, BSN Entered By: Sharyn Creamer on 06/23/2022 13:18:02 -------------------------------------------------------------------------------- Encounter Discharge Information Details Patient Name: Date of Service: [name], [name] 06/23/2022 12:30 PM Medical Record Number: [id number] Patient Account Number: [id number] Date of Birth/Sex: Treating RN: [birth year] (71 y.o. [name] Primary Care Sari Cogan: PA [name], [place] Other Clinician: Referring Wildon Cuevas: Treating Makoa Satz/Extender: [name] in Treatment: 19 Encounter Discharge Information Items Post Procedure Vitals Discharge Condition: Stable Temperature (F): 98.3 Ambulatory Status: Wheelchair Pulse (bpm): 69 Discharge Destination: Home Respiratory Rate (  breaths/min): 18 Transportation: Private Auto Blood Pressure (mmHg): 164/82 Accompanied By: self Schedule Follow-up Appointment: Yes Clinical Summary of Care: Patient [name], [name] ([id number]) [id number] Page 3 of 13 Electronic Signature(s) Signed: 06/23/2022 4:43:07 PM By: Sharyn Creamer RN, BSN Entered By: Sharyn Creamer on 06/23/2022 15:58:55 -------------------------------------------------------------------------------- Lower Extremity Assessment Details Patient Name: Date of Service: [name], [name] 06/23/2022 12:30 PM Medical Record Number: [id number] Patient Account Number: [id number] Date of Birth/Sex: Treating RN: 10-24-1950 (71 y.o. [name] Primary Care Torry Adamczak: PA [name], [place] Other Clinician: Referring Serayah Yazdani: Treating Laurinda Carreno/Extender: Valeria Batman Weeks in Treatment: 19 Edema Assessment Assessed: [Left: No] [Right: No] Edema: [Left: Yes] [Right: Yes] Calf Left: Right: Point of Measurement: From Medial Instep 41.5 cm 43.5 cm Ankle Left: Right: Point of Measurement:  From Medial Instep 25 cm 27.4 cm Vascular Assessment Pulses: Dorsalis Pedis Palpable: [Left:Yes] [Right:Yes] Electronic Signature(s) Signed: 06/23/2022 4:43:07 PM By: Sharyn Creamer RN, BSN Entered By: Sharyn Creamer on 06/23/2022 12:53:43 -------------------------------------------------------------------------------- Multi Wound Chart Details Patient Name: Date of Service: [name] 06/23/2022 12:30 PM Medical Record Number: [id number] Patient Account Number: [id number] Date of Birth/Sex: Treating RN: September 08, 1950 (71 y.o. M) Primary Care Tryson Lumley: PA [name], [place] Other Clinician: Referring Miette Molenda: Treating Lunetta Marina/Extender: [name] in Treatment: 19 Vital Signs Height(in): 71 Pulse(bpm): 69 Weight(lbs): 267 Blood Pressure(mmHg): 164/82 Body Mass Index(BMI): 37.2 Temperature(F): 98.3 Respiratory Rate(breaths/min): 18 [1:Photos:] [id number] Page 4 of 13] Left, Dorsal Foot Right, Medial Lower Leg Right, Anterior Lower Leg Wound Location: Gradually Appeared Gradually Appeared Gradually Appeared Wounding Event: Lymphedema Lymphedema Calcinosis Primary Etiology: Cataracts, Anemia, Lymphedema, Cataracts, Anemia, Lymphedema, Cataracts, Anemia, Lymphedema, Comorbid History: Congestive Heart Failure, Deep Vein Congestive Heart Failure, Deep Vein Congestive Heart Failure, Deep Vein Thrombosis, Hypertension, Peripheral Thrombosis, Hypertension, Peripheral Thrombosis, Hypertension, Peripheral Venous Disease Venous Disease Venous Disease 12/07/2021 12/07/2021 05/12/2022 Date Acquired: '19 19 6 '$ Weeks of Treatment: Open Open Open Wound Status: No No No Wound Recurrence: 1x1.1x0.1 3.5x5x0.9 0.6x0.5x0.3 Measurements L x W x D (cm) 0.864 13.744 0.236 A (cm) : rea 0.086 12.37 0.071 Volume (cm) : 77.20% -157.30% 0.00% % Reduction in Area: 94.30% -479.10% -195.80% % Reduction in Volume: Full Thickness Without Exposed  Full Thickness With Exposed Support Full Thickness Without Exposed Classification: Support Structures Structures Support Structures Medium Large Medium Exudate Amount: Serosanguineous Serosanguineous Serosanguineous Exudate Type: red, brown red, brown red, brown Exudate Color: No Yes No Foul Odor A Cleansing: fter N/A No N/A Odor Anticipated Due to Product Use: Distinct, outline attached Distinct, outline attached Epibole Wound Margin: Small (1-33%) Medium (34-66%) Medium (34-66%) Granulation A mount: Red Red, Hyper-granulation Red Granulation Quality: Large (67-100%) Medium (34-66%) Medium (34-66%) Necrotic Amount: Fat Layer (Subcutaneous Tissue): Yes Fat Layer (Subcutaneous Tissue): Yes Fat Layer (Subcutaneous Tissue): Yes Exposed Structures: Fascia: No Fascia: No Fascia: No Tendon: No Tendon: No Tendon: No Muscle: No Muscle: No Muscle: No Joint: No Joint: No Joint: No Bone: No Bone: No Bone: No Small (1-33%) Small (1-33%) None Epithelialization: Debridement - Selective/Open Wound Debridement - Excisional Debridement - Selective/Open Wound Debridement: Pre-procedure Verification/Time Out 13:03 13:03 13:03 Taken: Lidocaine 4% Topical Solution Lidocaine 4% Topical Solution Lidocaine 4% Topical Solution Pain Control: Necrotic/Eschar, Slough Other, Subcutaneous, [employer] Tissue Debrided: Non-Viable Tissue Skin/Subcutaneous Tissue Non-Viable Tissue Level: 1.1 17.5 0.3 Debridement A (sq cm): rea Curette Curette Curette Instrument: Minimum Minimum Minimum Bleeding: Pressure Pressure Pressure Hemostasis A chieved: '3 3 3 '$ Procedural Pain: 0 0 0 Post Procedural Pain: Procedure was tolerated well Procedure was tolerated  well Procedure was tolerated well Debridement Treatment Response: 1x1.1x0.1 3.5x5x0.9 0.6x0.5x0.3 Post Debridement Measurements L x W x D (cm) 0.086 12.37 0.071 Post Debridement Volume: (cm) Rash: Yes Excoriation: Yes No  Abnormalities Noted Periwound Skin Texture: No Abnormalities Noted No Abnormalities Noted No Abnormalities Noted Periwound Skin Moisture: Erythema: Yes Hemosiderin Staining: Yes Hemosiderin Staining: Yes Periwound Skin Color: Erythema: No Decreased N/A N/A Erythema Change: No Abnormality No Abnormality No Abnormality Temperature: Yes Yes Yes Tenderness on Palpation: Compression Therapy Compression Therapy Compression Therapy Procedures Performed: Debridement Debridement Debridement Treatment Notes Wound #1 (Foot) Wound Laterality: Dorsal, Left Cleanser Peri-Wound Care Sween Lotion (Moisturizing lotion) Discharge Instruction: Apply moisturizing lotion as directed Topical Primary Dressing KerraCel Ag Gelling Fiber Dressing, 4x5 in (silver alginate) Discharge Instruction: Apply silver alginate to wound bed as instructed JEZIEL, HOFFMANN ([id number]) [id number] Page 5 of 13 Secondary Dressing Woven Gauze Sponge, Non-Sterile 4x4 in Discharge Instruction: Apply over primary dressing as directed. Secured With Compression Wrap ThreePress (3 layer compression wrap) Discharge Instruction: Apply three layer compression as directed. Compression Stockings Add-Ons Wound #3 (Lower Leg) Wound Laterality: Right, Medial Cleanser Peri-Wound Care Sween Lotion (Moisturizing lotion) Discharge Instruction: Apply moisturizing lotion as directed Topical Primary Dressing Medline Woven Gauze Sponges 4x4 (in/in) Discharge Instruction: pack into wound anasept Discharge Instruction: moisten gauze with anasept gel and pack into wound Secondary Dressing ABD Pad, 8x10 Discharge Instruction: Apply over primary dressing as directed. Woven Gauze Sponge, Non-Sterile 4x4 in Discharge Instruction: Apply over primary dressing as directed. Zetuvit Plus 4x8 in Discharge Instruction: Apply over primary dressing as directed. Secured With Compression Wrap ThreePress (3 layer  compression wrap) Discharge Instruction: Apply three layer compression as directed. Compression Stockings Add-Ons Wound #6 (Lower Leg) Wound Laterality: Right, Anterior Cleanser Peri-Wound Care Sween Lotion (Moisturizing lotion) Discharge Instruction: Apply moisturizing lotion as directed Topical Primary Dressing Medline Woven Gauze Sponges 4x4 (in/in) Discharge Instruction: pack into wound anasept Discharge Instruction: moisten gauze with anasept gel and pack into wound Secondary Dressing ABD Pad, 8x10 Discharge Instruction: Apply over primary dressing as directed. Woven Gauze Sponge, Non-Sterile 4x4 in Discharge Instruction: Apply over primary dressing as directed. Zetuvit Plus 4x8 in Discharge Instruction: Apply over primary dressing as directed. Secured With [name] ([id number]) [id number] Page 6 of 13 Compression Wrap ThreePress (3 layer compression wrap) Discharge Instruction: Apply three layer compression as directed. Compression Stockings Add-Ons Electronic Signature(s) Signed: 06/23/2022 1:22:30 PM By: Fredirick Maudlin MD FACS Entered By: Fredirick Maudlin on 06/23/2022 13:22:30 -------------------------------------------------------------------------------- Multi-Disciplinary Care Plan Details Patient Name: Date of Service: [name], [name] 06/23/2022 12:30 PM Medical Record Number: [id number] Patient Account Number: [id number] Date of Birth/Sex: Treating RN: 06-23-1951 (71 y.o. [name] Primary Care Makesha Belitz: PA [name], [place] Other Clinician: Referring Kenedee Molesky: Treating Beatrice Ziehm/Extender: [name] in Treatment: 19 Multidisciplinary Care Plan reviewed with physician Active Inactive Venous Leg Ulcer Nursing Diagnoses: Knowledge deficit related to disease process and management Potential for venous Insuffiency (use before diagnosis confirmed) Goals: Patient will maintain optimal edema control Date  Initiated: 02/17/2022 Target Resolution Date: 06/30/2022 Goal Status: Active Interventions: Assess peripheral edema status every visit. Compression as ordered Provide education on venous insufficiency Treatment Activities: Non-invasive vascular studies : 02/17/2022 Therapeutic compression applied : 02/17/2022 Notes: Wound/Skin Impairment Nursing Diagnoses: Impaired tissue integrity Knowledge deficit related to ulceration/compromised skin integrity Goals: Patient/caregiver will verbalize understanding of skin care regimen Date Initiated: 02/17/2022 Target Resolution Date: 06/30/2022 Goal Status: Active Ulcer/skin breakdown will have a volume reduction of 30% by week 4 Date Initiated: 02/17/2022  Date Inactivated: 03/10/2022 Target Resolution Date: 03/10/2022 Unmet Reason: infection being treated, Goal Status: Unmet waiting on derm appte Ulcer/skin breakdown will have a volume reduction of 50% by week 8 Date Initiated: 03/10/2022 Date Inactivated: 04/14/2022 Target Resolution Date: 04/07/2022 Goal Status: Unmet Unmet Reason: calcinosis Ulcer/skin breakdown will have a volume reduction of 80% by week Montezuma, Cleo ([id number]) [id number] Page 7 of 13 Date Initiated: 04/14/2022 Date Inactivated: 05/05/2022 Target Resolution Date: 05/05/2022 Unmet Reason: infection, chronic Goal Status: Unmet condition Interventions: Assess patient/caregiver ability to obtain necessary supplies Assess patient/caregiver ability to perform ulcer/skin care regimen upon admission and as needed Assess ulceration(s) every visit Provide education on ulcer and skin care Treatment Activities: Skin care regimen initiated : 02/17/2022 Topical wound management initiated : 02/17/2022 Notes: Electronic Signature(s) Signed: 06/23/2022 4:43:07 PM By: Sharyn Creamer RN, BSN Entered By: Sharyn Creamer on 06/23/2022  13:04:43 -------------------------------------------------------------------------------- Pain Assessment Details Patient Name: Date of Service: [name], [name] 06/23/2022 12:30 PM Medical Record Number: [id number] Patient Account Number: [id number] Date of Birth/Sex: Treating RN: 02/08/1951 (71 y.o. [name] Primary Care Meril Dray: PA [name], [place] Other Clinician: Referring Michaeljoseph Revolorio: Treating Charls Custer/Extender: [name] in Treatment: 19 Active Problems Location of Pain Severity and Description of Pain Patient Has Paino No Site Locations Pain Management and Medication Current Pain Management: Electronic Signature(s) Signed: 06/23/2022 4:43:07 PM By: Sharyn Creamer RN, BSN Entered By: Sharyn Creamer on 06/23/2022 12:43:37 [name] ([id number]) [id number] Page 8 of 13 -------------------------------------------------------------------------------- Patient/Caregiver Education Details Patient Name: Date of Service: [name], [name] 11/29/2023andnbsp12:30 PM Medical Record Number: [id number] Patient Account Number: [id number] Date of Birth/Gender: Treating RN: 04-07-1951 (71 y.o. [name] Primary Care Physician: PA [name], [place] Other Clinician: Referring Physician: Treating Physician/Extender: [name] in Treatment: 19 Education Assessment Education Provided To: Patient Education Topics Provided Wound/Skin Impairment: Methods: Explain/Verbal Responses: State content correctly Electronic Signature(s) Signed: 06/23/2022 4:43:07 PM By: Sharyn Creamer RN, BSN Entered By: Sharyn Creamer on 06/23/2022 13:05:03 -------------------------------------------------------------------------------- Wound Assessment Details Patient Name: Date of Service: [name], [name] 06/23/2022 12:30 PM Medical Record Number: [id number] Patient Account Number: [id number] Date of Birth/Sex: Treating RN: [birth year] (71  y.o. [name] Primary Care Leo Fray: PA [name], [place] Other Clinician: Referring Maili Shutters: Treating Amiere Cawley/Extender: [name] in Treatment: 19 Wound Status Wound Number: 1 Primary Lymphedema Etiology: Wound Location: Left, Dorsal Foot Wound Open Wounding Event: Gradually Appeared Status: Date Acquired: 12/07/2021 Comorbid Cataracts, Anemia, Lymphedema, Congestive Heart Failure, Deep Weeks Of Treatment: 19 History: Vein Thrombosis, Hypertension, Peripheral Venous Disease Clustered Wound: No Photos Wound Measurements Length: (cm) 1 Width: (cm) 1.1 Strohman, Reinhard ([id number]) Depth: (cm) Area: (cm) Volume: (cm) % Reduction in Area: 77.2% % Reduction in Volume: 94.3% [id number] Page 9 of 13 0.1 Epithelialization: Small (1-33%) 0.864 Tunneling: No 0.086 Undermining: No Wound Description Classification: Full Thickness Without Exposed Support Structures Wound Margin: Distinct, outline attached Exudate Amount: Medium Exudate Type: Serosanguineous Exudate Color: red, brown Foul Odor After Cleansing: No Slough/Fibrino Yes Wound Bed Granulation Amount: Small (1-33%) Exposed Structure Granulation Quality: Red Fascia Exposed: No Necrotic Amount: Large (67-100%) Fat Layer (Subcutaneous Tissue) Exposed: Yes Necrotic Quality: Adherent Slough Tendon Exposed: No Muscle Exposed: No Joint Exposed: No Bone Exposed: No Periwound Skin Texture Texture Color No Abnormalities Noted: Yes No Abnormalities Noted: Yes Erythema Change: Decreased Moisture No Abnormalities Noted: Yes Temperature / Pain Temperature: No Abnormality Tenderness on Palpation: Yes Treatment Notes Wound #1 (Foot) Wound Laterality: Dorsal, Left Cleanser Peri-Wound Care Sween  Lotion (Moisturizing lotion) Discharge Instruction: Apply moisturizing lotion as directed Topical Primary Dressing KerraCel Ag Gelling Fiber Dressing, 4x5 in (silver  alginate) Discharge Instruction: Apply silver alginate to wound bed as instructed Secondary Dressing Woven Gauze Sponge, Non-Sterile 4x4 in Discharge Instruction: Apply over primary dressing as directed. Secured With Compression Wrap ThreePress (3 layer compression wrap) Discharge Instruction: Apply three layer compression as directed. Compression Stockings Add-Ons Electronic Signature(s) Signed: 06/23/2022 4:43:07 PM By: Sharyn Creamer RN, BSN Entered By: Sharyn Creamer on 06/23/2022 12:59:42 -------------------------------------------------------------------------------- Wound Assessment Details Patient Name: Date of Service: [name], [name] 06/23/2022 12:30 PM Medical Record Number: [id number] Patient Account Number: [id number] Date of Birth/Sex: Treating RN: Mar 21, 1951 (71 y.o. Joylene Igo, [address] [place], [place] ([id number]) [id number] Page 10 of 13 Primary Care Chan Rosasco: PA [name], [place] Other Clinician: Referring Meylin Stenzel: Treating Izaya Netherton/Extender: [name] in Treatment: 19 Wound Status Wound Number: 3 Primary Lymphedema Etiology: Wound Location: Right, Medial Lower Leg Wound Open Wounding Event: Gradually Appeared Status: Date Acquired: 12/07/2021 Comorbid Cataracts, Anemia, Lymphedema, Congestive Heart Failure, Deep Weeks Of Treatment: 19 History: Vein Thrombosis, Hypertension, Peripheral Venous Disease Clustered Wound: No Photos Wound Measurements Length: (cm) 3.5 Width: (cm) 5 Depth: (cm) 0.9 Area: (cm) 13.744 Volume: (cm) 12.37 % Reduction in Area: -157.3% % Reduction in Volume: -479.1% Epithelialization: Small (1-33%) Tunneling: No Undermining: No Wound Description Classification: Full Thickness With Exposed Suppo Wound Margin: Distinct, outline attached Exudate Amount: Large Exudate Type: Serosanguineous Exudate Color: red, brown rt Structures Foul Odor After Cleansing: Yes Due to Product Use:  No Slough/Fibrino Yes Wound Bed Granulation Amount: Medium (34-66%) Exposed Structure Granulation Quality: Red, Hyper-granulation Fascia Exposed: No Necrotic Amount: Medium (34-66%) Fat Layer (Subcutaneous Tissue) Exposed: Yes Necrotic Quality: Adherent Slough Tendon Exposed: No Muscle Exposed: No Joint Exposed: No Bone Exposed: No Periwound Skin Texture Texture Color No Abnormalities Noted: Yes No Abnormalities Noted: No Erythema: No Moisture Hemosiderin Staining: Yes No Abnormalities Noted: Yes Temperature / Pain Temperature: No Abnormality Tenderness on Palpation: Yes Treatment Notes Wound #3 (Lower Leg) Wound Laterality: Right, Medial Cleanser Peri-Wound Care Sween Lotion (Moisturizing lotion) Discharge Instruction: Apply moisturizing lotion as directed Topical Primary Dressing Medline Woven Gauze Sponges 4x4 (in/in) Discharge Instruction: pack into wound SHERREL, SHAFER ([id number]) [id number] Page 11 of 13 anasept Discharge Instruction: moisten gauze with anasept gel and pack into wound Secondary Dressing ABD Pad, 8x10 Discharge Instruction: Apply over primary dressing as directed. Woven Gauze Sponge, Non-Sterile 4x4 in Discharge Instruction: Apply over primary dressing as directed. Zetuvit Plus 4x8 in Discharge Instruction: Apply over primary dressing as directed. Secured With Compression Wrap ThreePress (3 layer compression wrap) Discharge Instruction: Apply three layer compression as directed. Compression Stockings Add-Ons Electronic Signature(s) Signed: 06/23/2022 4:43:07 PM By: Sharyn Creamer RN, BSN Entered By: Sharyn Creamer on 06/23/2022 13:00:39 -------------------------------------------------------------------------------- Wound Assessment Details Patient Name: Date of Service: [name], [name] 06/23/2022 12:30 PM Medical Record Number: [id number] Patient Account Number: [id number] Date of Birth/Sex: Treating RN: [birth year]  (71 y.o. [name] Primary Care Maryanne Huneycutt: PA [name], [place] Other Clinician: Referring Kamarri Lovvorn: Treating Kymorah Korf/Extender: [name] in Treatment: 19 Wound Status Wound Number: 6 Primary Calcinosis Etiology: Wound Location: Right, Anterior Lower Leg Wound Open Wounding Event: Gradually Appeared Status: Date Acquired: 05/12/2022 Comorbid Cataracts, Anemia, Lymphedema, Congestive Heart Failure, Deep Weeks Of Treatment: 6 History: Vein Thrombosis, Hypertension, Peripheral Venous Disease Clustered Wound: No Photos Wound Measurements Length: (cm) 0.6 Width: (cm) 0.5 Depth: (cm) 0.3 Area: (cm) 0.236 Volume: (cm) 0.071 % Reduction in Area:  0% % Reduction in Volume: -195.8% Epithelialization: None Tunneling: No Undermining: No Wound Description Classification: Full Thickness Without Exposed Support Structures Wound Margin: [name], [name] ([id number]) Exudate Amount: Medium Exudate Type: Serosanguineous Exudate Color: red, brown Foul Odor After Cleansing: No Slough/Fibrino Yes [id number] Page 12 of 13 Wound Bed Granulation Amount: Medium (34-66%) Exposed Structure Granulation Quality: Red Fascia Exposed: No Necrotic Amount: Medium (34-66%) Fat Layer (Subcutaneous Tissue) Exposed: Yes Necrotic Quality: Adherent Slough Tendon Exposed: No Muscle Exposed: No Joint Exposed: No Bone Exposed: No Periwound Skin Texture Texture Color No Abnormalities Noted: Yes No Abnormalities Noted: No Hemosiderin Staining: Yes Moisture No Abnormalities Noted: Yes Temperature / Pain Temperature: No Abnormality Tenderness on Palpation: Yes Treatment Notes Wound #6 (Lower Leg) Wound Laterality: Right, Anterior Cleanser Peri-Wound Care Sween Lotion (Moisturizing lotion) Discharge Instruction: Apply moisturizing lotion as directed Topical Primary Dressing Medline Woven Gauze Sponges 4x4 (in/in) Discharge Instruction:  pack into wound anasept Discharge Instruction: moisten gauze with anasept gel and pack into wound Secondary Dressing ABD Pad, 8x10 Discharge Instruction: Apply over primary dressing as directed. Woven Gauze Sponge, Non-Sterile 4x4 in Discharge Instruction: Apply over primary dressing as directed. Zetuvit Plus 4x8 in Discharge Instruction: Apply over primary dressing as directed. Secured With Compression Wrap ThreePress (3 layer compression wrap) Discharge Instruction: Apply three layer compression as directed. Compression Stockings Add-Ons Electronic Signature(s) Signed: 06/23/2022 4:43:07 PM By: Sharyn Creamer RN, BSN Entered By: Sharyn Creamer on 06/23/2022 13:01:18 -------------------------------------------------------------------------------- Vitals Details Patient Name: Date of Service: [name] 06/23/2022 12:30 PM Medical Record Number: [id number] Patient Account Number: [id number] Date of Birth/Sex: Treating RN: 07-20-1951 (71 y.o. Joylene Igo, [address] [place], [place] ([id number]) [id number] Page 13 of 13 Primary Care Tasmine Hipwell: PA [name], [place] Other Clinician: Referring Kirk Basquez: Treating Michale Weikel/Extender: [name] in Treatment: 19 Vital Signs Time Taken: 12:43 Temperature (F): 98.3 Height (in): 71 Pulse (bpm): 69 Weight (lbs): 267 Respiratory Rate (breaths/min): 18 Body Mass Index (BMI): 37.2 Blood Pressure (mmHg): 164/82 Reference Range: 80 - 120 mg / dl Electronic Signature(s) Signed: 06/23/2022 4:43:07 PM By: Sharyn Creamer RN, BSN Entered By: Sharyn Creamer on 06/23/2022 12:43:31

## 2022-06-23 NOTE — Progress Notes (Signed)
KEYDEN, PAVLOV ([id number]) [id number] Page 1 of 15 Visit Report for 06/23/2022 Chief Complaint Document Details Patient Name: Date of Service: [name], [name] 06/23/2022 12:30 PM Medical Record Number: [id number] Patient Account Number: [id number] Date of Birth/Sex: Treating RN: Aug 16, 1950 (71 y.o. M) Primary Care Provider: PA [name], NO Other Clinician: Referring Provider: Treating Provider/Extender: [name] in Treatment: 19 Information Obtained from: Patient Chief Complaint Patient seen for complaints of Non-Healing Wounds. Electronic Signature(s) Signed: 06/23/2022 1:23:43 PM By: Fredirick Maudlin MD FACS Entered By: Fredirick Maudlin on 06/23/2022 13:23:42 -------------------------------------------------------------------------------- Debridement Details Patient Name: Date of Service: [name], [name] 06/23/2022 12:30 PM Medical Record Number: [id number] Patient Account Number: [id number] Date of Birth/Sex: Treating RN: 1950/08/18 (71 y.o. [name] Primary Care Provider: PA [name], NO Other Clinician: Referring Provider: Treating Provider/Extender: [name] in Treatment: 19 Debridement Performed for Assessment: Wound #3 Right,Medial Lower Leg Performed By: Physician Fredirick Maudlin, MD Debridement Type: Debridement Level of Consciousness (Pre-procedure): Awake and Alert Pre-procedure Verification/Time Out Yes - 13:03 Taken: Start Time: 13:13 Pain Control: Lidocaine 4% T opical Solution T Area Debrided (L x W): otal 3.5 (cm) x 5 (cm) = 17.5 (cm) Tissue and other material debrided: Non-Viable, Slough, Subcutaneous, Slough, Other: calcium Level: Skin/Subcutaneous Tissue Debridement Description: Excisional Instrument: Curette Bleeding: Minimum Hemostasis Achieved: Pressure Procedural Pain: 3 Post Procedural Pain: 0 Response to Treatment: Procedure was tolerated well Level of  Consciousness (Post- Awake and Alert procedure): Post Debridement Measurements of Total Wound Length: (cm) 3.5 Width: (cm) 5 Depth: (cm) 0.9 Volume: (cm) 12.37 Character of Wound/Ulcer Post Debridement: Improved Post Procedure Diagnosis Same as PRAISE, DOLECKI ([id number]) [id number] Page 2 of 15 Notes scribed for Dr [name] by Sharyn Creamer, Rn Electronic Signature(s) Signed: 06/23/2022 1:41:48 PM By: Fredirick Maudlin MD FACS Signed: 06/23/2022 4:43:07 PM By: Sharyn Creamer RN, BSN Entered By: Sharyn Creamer on 06/23/2022 13:15:51 -------------------------------------------------------------------------------- Debridement Details Patient Name: Date of Service: [name], [name] 06/23/2022 12:30 PM Medical Record Number: [id number] Patient Account Number: [id number] Date of Birth/Sex: Treating RN: 25-May-1951 (71 y.o. [name] Primary Care Provider: PA [name], NO Other Clinician: Referring Provider: Treating Provider/Extender: [name] in Treatment: 19 Debridement Performed for Assessment: Wound #6 Right,Anterior Lower Leg Performed By: Physician Fredirick Maudlin, MD Debridement Type: Debridement Level of Consciousness (Pre-procedure): Awake and Alert Pre-procedure Verification/Time Out Yes - 13:03 Taken: Start Time: 13:13 Pain Control: Lidocaine 4% T opical Solution T Area Debrided (L x W): otal 0.6 (cm) x 0.5 (cm) = 0.3 (cm) Tissue and other material debrided: Non-Viable, Slough, Slough Level: Non-Viable Tissue Debridement Description: Selective/Open Wound Instrument: Curette Bleeding: Minimum Hemostasis Achieved: Pressure Procedural Pain: 3 Post Procedural Pain: 0 Response to Treatment: Procedure was tolerated well Level of Consciousness (Post- Awake and Alert procedure): Post Debridement Measurements of Total Wound Length: (cm) 0.6 Width: (cm) 0.5 Depth: (cm) 0.3 Volume: (cm)  0.071 Character of Wound/Ulcer Post Debridement: Improved Post Procedure Diagnosis Same as Pre-procedure Notes scribed for Dr [name] by Sharyn Creamer, Rn Electronic Signature(s) Signed: 06/23/2022 1:41:48 PM By: Fredirick Maudlin MD FACS Signed: 06/23/2022 4:43:07 PM By: Sharyn Creamer RN, BSN Entered By: Sharyn Creamer on 06/23/2022 13:16:48 Debridement Details -------------------------------------------------------------------------------- [name] ([id number]) [id number] Page 3 of 15 Patient Name: Date of Service: [name], [name] 06/23/2022 12:30 PM Medical Record Number: [id number] Patient Account Number: [id number] Date of Birth/Sex: Treating RN: July 16, 1951 (71 y.o. [name] Primary Care Provider: PA [name], NO Other Clinician: Referring Provider: Treating Provider/Extender:  Valeria Batman Weeks in Treatment: 19 Debridement Performed for Assessment: Wound #1 Left,Dorsal Foot Performed By: Physician Fredirick Maudlin, MD Debridement Type: Debridement Level of Consciousness (Pre-procedure): Awake and Alert Pre-procedure Verification/Time Out Yes - 13:03 Taken: Start Time: 13:13 Pain Control: Lidocaine 4% T opical Solution T Area Debrided (L x W): otal 1 (cm) x 1.1 (cm) = 1.1 (cm) Tissue and other material debrided: Non-Viable, Eschar, Slough, Slough Level: Non-Viable Tissue Debridement Description: Selective/Open Wound Instrument: Curette Bleeding: Minimum Hemostasis Achieved: Pressure Procedural Pain: 3 Post Procedural Pain: 0 Response to Treatment: Procedure was tolerated well Level of Consciousness (Post- Awake and Alert procedure): Post Debridement Measurements of Total Wound Length: (cm) 1 Width: (cm) 1.1 Depth: (cm) 0.1 Volume: (cm) 0.086 Character of Wound/Ulcer Post Debridement: Improved Post Procedure Diagnosis Same as Pre-procedure Notes scribed for Dr [name] by Sharyn Creamer, Rn Electronic  Signature(s) Signed: 06/23/2022 1:41:48 PM By: Fredirick Maudlin MD FACS Signed: 06/23/2022 4:43:07 PM By: Sharyn Creamer RN, BSN Entered By: Sharyn Creamer on 06/23/2022 13:17:29 -------------------------------------------------------------------------------- HPI Details Patient Name: Date of Service: [name], [name] 06/23/2022 12:30 PM Medical Record Number: [id number] Patient Account Number: [id number] Date of Birth/Sex: Treating RN: [birth year] (71 y.o. M) Primary Care Provider: PA [name], NO Other Clinician: Referring Provider: Treating Provider/Extender: [name] in Treatment: 19 History of Present Illness HPI Description: ADMISSION 02/09/2022 This is a 71 year old man who recently moved to [place] from [place]. He has a history of of coronary artery disease and prior left popliteal vein DVT . He is not diabetic and does not smoke. 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 He has had multiple spinal surgeries and has limited mobility. He primarily uses a motorized wheelchair. He has had lymphedema for many years and does have lymphedema pumps although he says he does not use them frequently. He has wounds on his bilateral lower extremities. He was receiving care in [place] for these prior to his move. They were apparently packing his wounds with Betadine soaked gauze. He has worn compression wraps in the past but not recently. He did say that they helped tremendously. In clinic today, his right ABI is noncompressible but has good signals; the left ABI was 1.17. No formal vascular studies have been performed. On his left dorsal foot, there is a circular lesion that exposes the fat layer. There is fairly heavy slough accumulation within the wound, but no significant odor. On his right medial calf, he has multiple pinholes that have slough buildup in them and probe for about 2 to 4 mm in depth. They are draining clear fluid. On his right lateral  midfoot, he has ulceration consistent with venous stasis. It is geographic and has slough accumulation. He has changes in his lower extremities EARSEL, SHOUSE ([id number]) [id number] Page 4 of 15 consistent with stasis dermatitis, but no hemosiderin deposition or thickening of the skin. 02/17/2022: All of the wounds are little bit larger today and have more slough accumulation. Today, the medial calf openings are larger and probe to something that feels calcified. There is odor coming from his wounds. His vascular studies are scheduled for August 9 and 10. 02-24-2022 upon evaluation today patient presents for follow-up concerning his ongoing issues here with his lower extremities. With that being said he does have significant problems with what appears to be cellulitis unfortunately the PCR culture that was sent last week got crushed by [employer] in transit. With that being said we are going to reobtain  a culture today although I am actually to do it on the right medial leg as this seems to be the most inflamed area today where there is some questionable drainage as well. I am going to remove some of the calcium deposits which are loosening obtain a deeper culture from this area. Fortunately I do not see any evidence of active infection systemically which is good news but locally I think we are having a bigger issue here. He does have his appointment next Thursday with vascular. Patient has also been referred to [facility] for further evaluation and treatment of the calcinosis for possible sulfate injections. 03/04/2022: The culture that was performed last week grew out multiple species including Klebsiella, Morganella, and Pseudomonas aeruginosa. He had been prescribed Bactrim but this was not adequate to cover all of the species and levofloxacin was recommended. He completed the Bactrim but did not pick up the levofloxacin and begin taking it until yesterday. 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 He had arterial studies performed today. He does have evidence of peripheral arterial disease. Results copied here: +-------+-----------+-----------+------------+------------+ ABI/TBIT oday's ABIT oday's TBIPrevious ABIPrevious TBI +-------+-----------+-----------+------------+------------+ Right [place] 0.75    +-------+-----------+-----------+------------+------------+ Left [place] 0.57    +-------+-----------+-----------+------------+------------+ Arterial wall calcification precludes accurate ankle pressures and ABIs. Summary: Right: Resting right ankle-brachial index indicates noncompressible right lower extremity arteries. The right toe-brachial index is normal. Left: Resting left ankle-brachial index indicates noncompressible left lower extremity arteries. The left toe-brachial index is abnormal. He continues to have further tissue breakdown at the right medial calf and there is ample slough accumulation along with necrotic subcutaneous tissue. The left dorsal foot wound has built up slough, as well. The right medial foot wound is nearly closed with just a light layer of eschar over the surface. The right dorsal lateral foot wound has also accumulated a fair amount of slough and remains quite tender. 03/10/2022: He has been taking the prescribed levofloxacin and has about 3 days left of this. The erythema and induration on his right leg has improved. He continues to experience further breakdown of the right medial calf wound. There is extensive slough and debris accumulation there. There is heavy slough on his left dorsal foot wound, but no odor or purulent drainage. The right medial foot wound appears to be closed. The right dorsal lateral foot wound also has a fair amount of slough but it is  less tender than at our last visit. He still does not have an appointment with dermatology. 03/22/2022: The right anterior tibial wound has closed. The right lateral foot wound is nearly closed with just a little bit of slough. The left dorsal foot wound is smaller and continues to have some slough buildup but less so than at prior visits. There is good granulation tissue underlying the slough. Unfortunately the right medial calf wound continues to deteriorate. It has a foul odor today and a lot of necrotic tissue, the surrounding skin is also erythematous and moist. 03/30/2022: The right lateral foot wound continues to contract and just has a little bit of slough and eschar present. The left dorsal foot wound is also smaller with slough overlying good granulation tissue. The right medial calf wound actually looks quite a bit better today; there is no odor and there is much less necrotic tissue although some remains present. We have been using topical  gentamicin and he has been taking oral Augmentin. 04/07/2022: The patient saw dermatology at [facility] last week. Unfortunately, it appears that they did not read any of the documentation that was provided. 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 Overall, it was a highly unsatisfactory consultation. In the meantime, however, we were able to identify compounding pharmacy who could create a topical sodium thiosulfate ointment. The patient has that with him today. Both of the right foot wounds are now closed. The left dorsal foot wound has contracted but still has a layer of slough on the surface. The medial calf wounds on the right all look a little bit better. They still have heavy slough  along with the chunks of calcium. Everything is stained purple. 04/14/2022: The wound on his dorsal left foot is a little bit smaller again today. There is still slough accumulation on the surface. The medial calf wounds have quite a bit less slough accumulation today. His right leg, however, is warm and erythematous, concerning for cellulitis. 04/14/2022: He completed his course of Augmentin yesterday. The medial calf wound sites are much cleaner today and shallower. There is less fragmented calcium in the wounds. He has a lot of lymphedema fluid-like drainage from these wounds, but no purulent discharge or malodor. The wound on his dorsal left foot has also contracted and is shallower. There is a layer of slough over healthy granulation tissue. 05/05/2022: The left dorsal foot wound is smaller today with less slough and more granulation tissue. The right medial calf wounds are shallower, but have extensive slough and some fat necrosis. The drainage has diminished considerably, however. 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 05/12/2022: The left dorsal foot wound continues to contract and fill with granulation tissue. There is slough on the wound surface. The right medial calf wounds also continue to fill with healthy tissue, but there is remaining slough and fat necrosis present. He had a significant increase in his drainage this week and it was a bright yellow-green color. He also had a new wound open over his right anterior tibial surface associated with the spike of cutaneous calcium. The leg is more red, as well, concerning for infection. 05/19/2022: His culture returned positive for Pseudomonas. He is currently taking a course of oral levofloxacin. We have also been using topical gentamicin mixed in with his sodium thiosulfate. All of the wounds look better this week. The ones  associated with his calcinosis cutis have filled in substantially. There is slough and nonviable subcutaneous tissue still present. The new anterior tibial wound just has a light layer of slough. The dorsal foot wound also has some slough present and appears a little dry today. 05/26/2022: The right medial leg wound continues to improve. It is feeling with granulation tissue, but still accumulates a fairly thick layer of slough on the surface. The more recent anterior tibial wound has remained quite small, but also has some slough on the surface. The left dorsal foot wound has contracted somewhat and has perimeter epithelialization. He completed his oral levofloxacin. 06/02/2022: The left dorsal foot wound continues to improve significantly. There is just a little slough on  the wound surface. The right medial leg wound had black drainage, but it looks like silver alginate was applied last week and I theorized that silver sulfide was created with a combination of the silver alginate and the WHITTEN, ANDREONI ([id number]) [id number] Page 5 of 15 sodium thiosulfate. It did, however, have a bit of an odor to it and extensive slough accumulation. The small anterior tibial wound also had a bit of slough and nonviable subcutaneous tissue present. The skin of his leg was rather macerated, as well. 06/09/2022: The left dorsal foot wound is smaller again this week with just a light layer of slough on the surface. The right medial leg wound looks substantially better. The leg and periwound skin are no longer red and macerated. There is good granulation tissue forming with less slough and nonviable tissue. The anterior tibial wound just has a small amount of slough accumulation. 06/16/2022: The left dorsal foot wound continues to contract. His wrap slipped a little bit, however, and his foot is more swollen. The right medial leg wound continues to improve. The underlying tissue is more  vital-appearing and there is less slough. He does have substantial drainage. The small anterior tibial wound has a bit of slough accumulation. 06/23/2022: The left dorsal foot wound is about half the size as it was last week. There is just some light slough on the surface and some eschar buildup around the edges. The right anterior leg wound is small with a little slough on the surface. Unfortunately, the right medial leg wound deteriorated fairly substantially. It is malodorous with gray/green/black discoloration. Electronic Signature(s) Signed: 06/23/2022 1:24:51 PM By: Fredirick Maudlin MD FACS Entered By: Fredirick Maudlin on 06/23/2022 13:24:50 -------------------------------------------------------------------------------- Physical Exam Details Patient Name: Date of Service: [name], [name] 06/23/2022 12:30 PM Medical Record Number: [id number] Patient Account Number: [id number] Date of Birth/Sex: Treating RN: 1951-03-21 (71 y.o. M) Primary Care Provider: PA [name], NO Other Clinician: Referring Provider: Treating Provider/Extender: [name] in Treatment: 28 Constitutional He is hypertensive, but asymptomatic.. . . . No acute distress. Respiratory Normal work of breathing on room air. Notes 06/23/2022: The left dorsal foot wound is about half the size as it was last week. There is just some light slough on the surface and some eschar buildup around the edges. The right anterior leg wound is small with a little slough on the surface. Unfortunately, the right medial leg wound deteriorated fairly substantially. It is malodorous with gray/green/black discoloration. Electronic Signature(s) Signed: 06/23/2022 1:25:30 PM By: Fredirick Maudlin MD FACS Entered By: Fredirick Maudlin on 06/23/2022 13:25:30 -------------------------------------------------------------------------------- Physician Orders Details Patient Name: Date of Service: KRISTIN, LAMAGNA [name] 06/23/2022 12:30  PM Medical Record Number: [id number] Patient Account Number: [id number] Date of Birth/Sex: Treating RN: [birth year] (71 y.o. [name] Primary Care Provider: PA [name], NO Other Clinician: Referring Provider: Treating Provider/Extender: [name] in Treatment: 19 Verbal / Phone Orders: No Diagnosis Coding ICD-10 Coding Code Description [phone] 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d L97.522 Non-pressure chronic ulcer of other part of left foot with fat layer exposed MOUSSA, WIEGAND ([id number]) [id number] Page 6 of 15 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) I10 Essential (primary) hypertension I25.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 - Dr. [name] Rm 1 Return A Anesthetic Wound #1 Left,Dorsal Foot (  In clinic) Topical Lidocaine 4% applied to wound bed Wound #3 Right,Medial Lower Leg (In clinic) Topical Lidocaine 4% applied to wound bed Wound #6 Right,Anterior Lower Leg (In clinic) Topical Lidocaine 4% applied to wound bed Bathing/ Shower/ Hygiene May shower with protection but do not get wound dressing(s) wet. Edema Control - Lymphedema / SCD / Other Lymphedema Pumps. Use Lymphedema pumps on leg(s) 2-3 times a day for 45-60 minutes. If wearing any wraps or hose, do not remove them. Continue exercising as instructed. - use 1-2 times per day over wraps Elevate legs to the level of the heart or above for 30 minutes daily and/or when sitting, a frequency of: Exercise regularly Wound Treatment Wound #1 - Foot Wound Laterality: Dorsal, Left Peri-Wound Care: Sween Lotion (Moisturizing lotion) 1 x Per Week Discharge Instructions: Apply moisturizing lotion as  directed Prim Dressing: KerraCel Ag Gelling Fiber Dressing, 4x5 in (silver alginate) 1 x Per Week ary Discharge Instructions: Apply silver alginate to wound bed as instructed Secondary Dressing: Woven Gauze Sponge, Non-Sterile 4x4 in 1 x Per Week Discharge Instructions: Apply over primary dressing as directed. Compression Wrap: ThreePress (3 layer compression wrap) 1 x Per Week Discharge Instructions: Apply three layer compression as directed. Wound #3 - Lower Leg Wound Laterality: Right, Medial Peri-Wound Care: Sween Lotion (Moisturizing lotion) 1 x Per Week Discharge Instructions: Apply moisturizing lotion as directed Topical: Gentamicin 1 x Per Week Discharge Instructions: As directed by physician Prim Dressing: Santyl Ointment 1 x Per Week ary Discharge Instructions: Apply nickel thick amount to wound bed as instructed Prim Dressing: KerraCel Ag Gelling Fiber Dressing, 4x5 in (silver alginate) 1 x Per Week ary Discharge Instructions: Apply silver alginate to wound bed as instructed Prim Dressing: Medline Woven Gauze Sponges 4x4 (in/in) ary 1 x Per Week Discharge Instructions: pack into wound Secondary Dressing: ABD Pad, 8x10 1 x Per Week Discharge Instructions: Apply over primary dressing as directed. Secondary Dressing: Woven Gauze Sponge, Non-Sterile 4x4 in 1 x Per Week Discharge Instructions: Apply over primary dressing as directed. Secondary Dressing: Zetuvit Plus 4x8 in 1 x Per Week Discharge Instructions: Apply over primary dressing as directed. Compression Wrap: ThreePress (3 layer compression wrap) 1 x Per Week Discharge Instructions: Apply three layer compression as directed. Wound #6 - Lower Leg Wound Laterality: Right, Anterior Peri-Wound Care: Sween Lotion (Moisturizing lotion) 1 x Per Week Discharge Instructions: Apply moisturizing lotion as directed Topical: Gentamicin 1 x Per CATO, LIBURD ([id number]) [id number] Page 7 of  15 Discharge Instructions: As directed by physician Prim Dressing: Santyl Ointment 1 x Per Week ary Discharge Instructions: Apply nickel thick amount to wound bed as instructed Prim Dressing: KerraCel Ag Gelling Fiber Dressing, 4x5 in (silver alginate) 1 x Per Week ary Discharge Instructions: Apply silver alginate to wound bed as instructed Prim Dressing: Medline Woven Gauze Sponges 4x4 (in/in) ary 1 x Per Week Discharge Instructions: pack into wound Secondary Dressing: ABD Pad, 8x10 1 x Per Week Discharge Instructions: Apply over primary dressing as directed. Secondary Dressing: Woven Gauze Sponge, Non-Sterile 4x4 in 1 x Per Week Discharge Instructions: Apply over primary dressing as directed. Secondary Dressing: Zetuvit Plus 4x8 in 1 x Per Week Discharge Instructions: Apply over primary dressing as directed. Compression Wrap: ThreePress (3 layer compression wrap) 1 x Per Week Discharge Instructions: Apply three layer compression as directed. Patient Medications llergies: No Known Allergies A Notifications Medication Indication Start End prior to debridement 06/23/2022 lidocaine DOSE topical 4 % cream - cream topical once daily Electronic Signature(s)  Signed: 06/23/2022 1:41:48 PM By: Fredirick Maudlin MD FACS Entered By: Fredirick Maudlin on 06/23/2022 13:25:58 -------------------------------------------------------------------------------- Problem List Details Patient Name: Date of Service: WIRT, HEMMERICH [name] 06/23/2022 12:30 PM Medical Record Number: [id number] Patient Account Number: [id number] Date of Birth/Sex: Treating RN: [birth year] (71 y.o. 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 No Yes 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) 02/09/2022 No Yes Ryther, Dariyon ([id number]) [id number] Page 8 of 15 I10 Essential (primary) hypertension 02/09/2022 No Yes I25.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 02/09/2022 No Yes I89.0 Lymphedema, not elsewhere classified 02/09/2022 No Yes Z86.718 Personal history of other venous thrombosis and embolism 02/09/2022 No Yes Inactive Problems ICD-10 Code Description Active Date Inactive Date L97.512 Non-pressure chronic ulcer of other part of right foot with fat layer exposed 02/17/2022 02/17/2022 Resolved Problems Electronic Signature(s) Signed: 06/23/2022 1:22:20 PM By: Fredirick Maudlin MD FACS Entered By: Fredirick Maudlin on 06/23/2022 13:22:20 -------------------------------------------------------------------------------- Progress Note Details Patient Name: Date of Service: [name] [name] 06/23/2022 12:30 PM Medical Record Number: [id number] Patient Account Number: [id number] Date of Birth/Sex: Treating RN: 1950-10-23 (71 y.o. M) Primary Care Provider: PA [name], NO Other Clinician: Referring Provider: Treating Provider/Extender: [name] in Treatment: 19 Subjective Chief Complaint Information obtained from Patient Patient seen for complaints of Non-Healing Wounds. History of Present Illness (HPI) ADMISSION 02/09/2022 This is a 71 year old man who recently moved to [place] from [place]. He has a history of of coronary artery disease and prior left popliteal vein DVT . He is not diabetic and does not smoke. 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 He has had multiple spinal surgeries and has limited mobility. He primarily uses a motorized wheelchair. He has had  lymphedema for many years and does have lymphedema pumps although he says he does not use them frequently. He has wounds on his bilateral lower extremities. He was receiving care in [place] for these prior to his move. They were apparently packing his wounds with Betadine soaked gauze. He has worn compression wraps in the past but not recently. He did say that they helped tremendously. In clinic today, his right ABI is noncompressible but has good signals; the left ABI was 1.17. No formal vascular studies have been performed. On his left dorsal foot, there is a circular lesion that exposes the fat layer. There is fairly heavy slough accumulation within the wound, but no significant odor. On his right medial calf, he has multiple pinholes that have slough buildup in them and probe for about 2 to 4 mm in depth. They are draining clear fluid. On his right lateral midfoot, he has ulceration consistent with venous stasis. It is geographic and has slough accumulation. 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 02/17/2022: All of the wounds are little bit larger today and have more slough accumulation. Today, the medial calf openings are larger and probe to something that feels calcified. There is odor coming from his wounds. His vascular studies are scheduled for August 9 and 10. 02-24-2022 upon evaluation today  patient presents for follow-up concerning his ongoing issues here with his lower extremities. With that being said he does have significant problems with what appears to be cellulitis unfortunately the PCR culture that was sent last week got crushed by [employer] in transit. With that being said we are going to reobtain a culture today although I am actually to do it on the right medial leg as this seems to be the most inflamed area today where there is some questionable drainage as well. I am going to remove some of the  calcium deposits which are loosening obtain a deeper culture from this area. Fortunately I do not see any evidence of active infection systemically which is good news but locally I think we are having a bigger issue here. He does have his NASARIO, CZERNIAK ([id number]) [id number] Page 9 of 15 appointment next Thursday with vascular. Patient has also been referred to [facility] for further evaluation and treatment of the calcinosis for possible sulfate injections. 03/04/2022: The culture that was performed last week grew out multiple species including Klebsiella, Morganella, and Pseudomonas aeruginosa. He had been prescribed Bactrim but this was not adequate to cover all of the species and levofloxacin was recommended. He completed the Bactrim but did not pick up the levofloxacin and begin taking it until yesterday. 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 He had arterial studies performed today. He does have evidence of peripheral arterial disease. Results copied here: +-------+-----------+-----------+------------+------------+ ABI/TBIT oday's ABIT oday's TBIPrevious ABIPrevious TBI +-------+-----------+-----------+------------+------------+ Right Hytop 0.75    +-------+-----------+-----------+------------+------------+ Left Mower 0.57    +-------+-----------+-----------+------------+------------+ Arterial wall calcification precludes accurate ankle pressures and ABIs. Summary: Right: Resting right ankle-brachial index indicates noncompressible right lower extremity arteries. The right toe-brachial index is normal. Left: Resting left ankle-brachial index indicates noncompressible left lower extremity arteries. The left toe-brachial index is abnormal. He continues to have further tissue  breakdown at the right medial calf and there is ample slough accumulation along with necrotic subcutaneous tissue. The left dorsal foot wound has built up slough, as well. The right medial foot wound is nearly closed with just a light layer of eschar over the surface. The right dorsal lateral foot wound has also accumulated a fair amount of slough and remains quite tender. 03/10/2022: He has been taking the prescribed levofloxacin and has about 3 days left of this. The erythema and induration on his right leg has improved. He continues to experience further breakdown of the right medial calf wound. There is extensive slough and debris accumulation there. There is heavy slough on his left dorsal foot wound, but no odor or purulent drainage. The right medial foot wound appears to be closed. The right dorsal lateral foot wound also has a fair amount of slough but it is less tender than at our last visit. He still does not have an appointment with dermatology. 03/22/2022: The right anterior tibial wound has closed. The right lateral foot wound is nearly closed with just a little bit of slough. The left dorsal foot wound is smaller and continues to have some slough buildup but less so than at prior visits. There is good granulation tissue underlying the slough. Unfortunately the right medial calf wound continues to deteriorate. It has a foul odor today and a lot of necrotic tissue, the surrounding skin is also erythematous and moist. 03/30/2022: The right lateral foot wound  continues to contract and just has a little bit of slough and eschar present. The left dorsal foot wound is also smaller with slough overlying good granulation tissue. The right medial calf wound actually looks quite a bit better today; there is no odor and there is much less necrotic tissue although some remains present. We have been using topical gentamicin and he has been taking oral Augmentin. 04/07/2022: The patient saw dermatology at  [facility] last week. Unfortunately, it appears that they did not read any of the documentation that was provided. 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 Overall, it was a highly unsatisfactory consultation. In the meantime, however, we were able to identify compounding pharmacy who could create a topical sodium thiosulfate ointment. The patient has that with him today. Both of the right foot wounds are now closed. The left dorsal foot wound has contracted but still has a layer of slough on the surface. The medial calf wounds on the right all look a little bit better. They still have heavy slough along with the chunks of calcium. Everything is stained purple. 04/14/2022: The wound on his dorsal left foot is a little bit smaller again today. There is still slough accumulation on the surface. The medial calf wounds have quite a bit less slough accumulation today. His right leg, however, is warm and erythematous, concerning for cellulitis. 04/14/2022: He completed his course of Augmentin yesterday. The medial calf wound sites are much cleaner today and shallower. There is less fragmented calcium in the wounds. He has a lot of lymphedema fluid-like drainage from these wounds, but no purulent discharge or malodor. The wound on his dorsal left foot has also contracted and is shallower. There is a layer of slough over healthy granulation tissue. 05/05/2022: The left dorsal foot wound is smaller today with less slough and more granulation tissue. The right medial calf wounds are shallower, but have extensive slough and some fat necrosis. The drainage has diminished considerably, however. 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 05/12/2022: The left dorsal foot wound continues to contract and fill with granulation tissue. There is slough on the wound surface. The right medial calf wounds also continue to fill with healthy tissue, but there is remaining slough and fat necrosis present. He had a significant increase in his drainage this week and it was a bright yellow-green color. He also had a new wound open over his right anterior tibial surface associated with the spike of cutaneous calcium. The leg is more red, as well, concerning for infection. 05/19/2022: His culture returned positive for Pseudomonas. He is currently taking a course of oral levofloxacin. We have also been using topical gentamicin mixed in with his sodium thiosulfate. All of the wounds look better this week. 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 There is slough and nonviable subcutaneous tissue still present. The new anterior tibial wound just has a light layer of slough. The dorsal foot wound also has some slough present and appears a little dry today. 05/26/2022: The right medial leg wound continues to improve. It is feeling with granulation tissue, but still accumulates a  fairly thick layer of slough on the surface. The more recent anterior tibial wound has remained quite small, but also has some slough on the surface. The left dorsal foot wound has contracted somewhat and has perimeter epithelialization. He completed his oral levofloxacin. 06/02/2022: The left dorsal foot wound continues to improve significantly. There is just a little slough on the wound surface. The right medial leg wound had black drainage, but it looks like silver alginate was applied last week and I theorized that silver sulfide was created with a combination of the silver alginate and the sodium thiosulfate. It did, however, have a bit  of an odor to it and extensive slough accumulation. The small anterior tibial wound also had a bit of slough and nonviable subcutaneous tissue present. The skin of his leg was rather macerated, as well. 06/09/2022: The left dorsal foot wound is smaller again this week with just a light layer of slough on the surface. The right medial leg wound looks substantially better. The leg and periwound skin are no longer red and macerated. There is good granulation tissue forming with less slough and nonviable tissue. The anterior tibial wound just has a small amount of slough accumulation. 06/16/2022: The left dorsal foot wound continues to contract. His wrap slipped a little bit, however, and his foot is more swollen. The right medial leg wound continues to improve. The underlying tissue is more vital-appearing and there is less slough. He does have substantial drainage. The small anterior tibial wound has a bit of slough accumulation. HANSFORD, [name] ([id number]) [id number] Page 10 of 15 06/23/2022: The left dorsal foot wound is about half the size as it was last week. There is just some light slough on the surface and some eschar buildup around the edges. The right anterior leg wound is small with a little slough on the surface. Unfortunately, the right medial leg wound deteriorated fairly substantially. It is malodorous with gray/green/black discoloration. Patient History Information obtained from Patient, Chart. Family History Cancer - Mother, Heart Disease - Father,Mother, Hypertension - Father,Siblings, Lung Disease - Siblings, No family history of Diabetes, Hereditary Spherocytosis, Kidney Disease, Seizures, Stroke, Thyroid Problems, Tuberculosis. Social History Former smoker - quit 1991, Marital Status - Married, Alcohol Use - Moderate, Drug Use - No History, Caffeine Use - Daily - coffee. Medical History Eyes Patient has history of Cataracts - right eye removed Denies  history of Glaucoma, Optic Neuritis Ear/Nose/Mouth/Throat Denies history of Chronic sinus problems/congestion, Middle ear problems Hematologic/Lymphatic Patient has history of Anemia - macrocytic, Lymphedema Cardiovascular Patient has history of Congestive Heart Failure, Deep Vein Thrombosis - left leg, Hypertension, Peripheral Venous Disease Endocrine Denies history of Type I Diabetes, Type II Diabetes Genitourinary Denies history of End Stage Renal Disease Integumentary (Skin) Denies history of History of Burn Oncologic Denies history of Received Chemotherapy, Received Radiation Psychiatric Denies history of Anorexia/bulimia, Confinement Anxiety Hospitalization/Surgery History - cervical fusion. - lumbar surgery. - coronary stent placement. - lasik eye surgery. - hydrocele excision. Medical A Surgical History Notes nd Constitutional Symptoms (General Health) obesity Ear/Nose/Mouth/Throat hard of hearing Respiratory frozen diaphragm right Cardiovascular hyperlipidemia Musculoskeletal myositis, lumbar DDD, spinal stenosis, cervical facet joint syndrome Objective Constitutional He is hypertensive, but asymptomatic.Marland Kitchen No acute distress. Vitals Time Taken: 12:43 PM, Height: 71 in, Weight: 267 lbs, BMI: 37.2, Temperature: 98.3 F, Pulse: 69 bpm, Respiratory Rate: 18 breaths/min, Blood Pressure: 164/82 mmHg. Respiratory Normal work of breathing on room air. General Notes: 06/23/2022: The left dorsal foot wound is about half the size  as it was last week. There is just some light slough on the surface and some eschar buildup around the edges. The right anterior leg wound is small with a little slough on the surface. Unfortunately, the right medial leg wound deteriorated fairly substantially. It is malodorous with gray/green/black discoloration. Integumentary (Hair, Skin) Wound #1 status is Open. Original cause of wound was Gradually Appeared. The date acquired was: 12/07/2021. The  wound has been in treatment 19 weeks. The wound is located on the Left,Dorsal Foot. The wound measures 1cm length x 1.1cm width x 0.1cm depth; 0.864cm^2 area and 0.086cm^3 volume. There is Fat Layer (Subcutaneous Tissue) exposed. There is no tunneling or undermining noted. There is a medium amount of serosanguineous drainage noted. The wound margin is distinct with the outline attached to the wound base. There is small (1-33%) red granulation within the wound bed. There is a large (67-100%) amount of necrotic tissue within the wound bed including Adherent Slough. The periwound skin appearance had no abnormalities noted for texture. The periwound skin appearance had no abnormalities noted for moisture. The periwound skin appearance had no abnormalities noted for color. Periwound temperature was noted as No Abnormality. The periwound has tenderness on palpation. Wound #3 status is Open. Original cause of wound was Gradually Appeared. The date acquired was: 12/07/2021. The wound has been in treatment 19 weeks. The wound is located on the Right,Medial Lower Leg. The wound measures 3.5cm length x 5cm width x 0.9cm depth; 13.744cm^2 area and 12.37cm^3 volume. There is Fat Layer (Subcutaneous Tissue) exposed. There is no tunneling or undermining noted. There is a large amount of serosanguineous drainage noted. Foul odor after cleansing was noted. The wound margin is distinct with the outline attached to the wound base. There is medium (34-66%) red, hyper CONLEY, DELISLE ([id number]) [id number] Page 11 of 15 granulation within the wound bed. There is a medium (34-66%) amount of necrotic tissue within the wound bed including Adherent Slough. The periwound skin appearance had no abnormalities noted for texture. The periwound skin appearance had no abnormalities noted for moisture. The periwound skin appearance exhibited: Hemosiderin Staining. The periwound skin appearance did not  exhibit: Erythema. Periwound temperature was noted as No Abnormality. The periwound has tenderness on palpation. Wound #6 status is Open. Original cause of wound was Gradually Appeared. The date acquired was: 05/12/2022. The wound has been in treatment 6 weeks. The wound is located on the Right,Anterior Lower Leg. The wound measures 0.6cm length x 0.5cm width x 0.3cm depth; 0.236cm^2 area and 0.071cm^3 volume. There is Fat Layer (Subcutaneous Tissue) exposed. There is no tunneling or undermining noted. There is a medium amount of serosanguineous drainage noted. The wound margin is epibole. There is medium (34-66%) red granulation within the wound bed. There is a medium (34-66%) amount of necrotic tissue within the wound bed including Adherent Slough. The periwound skin appearance had no abnormalities noted for texture. The periwound skin appearance had no abnormalities noted for moisture. The periwound skin appearance exhibited: Hemosiderin Staining. Periwound temperature was noted as No Abnormality. The periwound has tenderness on palpation. 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 There was a Selective/Open Wound Non-Viable Tissue Debridement with a total area of 1.1 sq cm performed by Fredirick Maudlin, MD. With the following instrument(s): Curette to remove Non-Viable tissue/material. Material removed includes [place], and Other: calcium after achieving pain control  using Lidocaine 4% T opical Solution. No specimens were taken. A time out was conducted at 13:03, prior to the start of the procedure. A Minimum amount of bleeding was controlled with Pressure. The procedure was tolerated well with a pain level of 3 throughout and a pain level of 0 following the procedure. Post Debridement Measurements: 1cm length x 1.1cm width x 0.1cm depth; 0.086cm^3 volume. Character of Wound/Ulcer Post Debridement is improved. Post procedure Diagnosis Wound #1: Same as Pre-Procedure General Notes: scribed for Dr [name] by Sharyn Creamer, Rn. Pre-procedure diagnosis of Wound #1 is a Lymphedema located on the Left,Dorsal Foot . There was a Three Layer Compression Therapy Procedure by Sharyn Creamer, RN. Post procedure Diagnosis Wound #1: Same as Pre-Procedure Wound #3 Pre-procedure diagnosis of Wound #3 is a Lymphedema located on the Right,Medial Lower Leg . There was a Excisional Skin/Subcutaneous Tissue Debridement with a total area of 17.5 sq cm performed by Fredirick Maudlin, MD. With the following instrument(s): Curette to remove Non-Viable tissue/material. Material removed includes Subcutaneous Tissue, Slough, and Other: calcium after achieving pain control using Lidocaine 4% Topical Solution. No specimens were taken. A time out was conducted at 13:03, prior to the start of the procedure. A Minimum amount of bleeding was controlled with Pressure. The procedure was tolerated well with a pain level of 3 throughout and a pain level of 0 following the procedure. Post Debridement Measurements: 3.5cm length x 5cm width x 0.9cm depth; 12.37cm^3 volume. Character of Wound/Ulcer Post Debridement is improved. Post procedure Diagnosis Wound #3: Same as Pre-Procedure General Notes: scribed for Dr [name] by Sharyn Creamer, Rn. Pre-procedure diagnosis of Wound #3 is a Lymphedema located on the Right,Medial Lower Leg . There was a Three Layer Compression Therapy Procedure by Sharyn Creamer, RN. 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 There was a Selective/Open Wound Non-Viable Tissue Debridement with a total area of 0.3 sq cm performed by Fredirick Maudlin, MD. With the following instrument(s): Curette to remove Non-Viable tissue/material. Material removed includes [facility] and Other: calcium after achieving pain control using Lidocaine 4% T opical Solution. No specimens were taken. A time out was conducted at 13:03, prior to the start of the procedure. A Minimum amount of bleeding was controlled with Pressure. The procedure was tolerated well with a pain level of 3 throughout and a pain level of 0 following the procedure. Post Debridement Measurements: 0.6cm length x 0.5cm width x 0.3cm depth; 0.071cm^3 volume. Character of Wound/Ulcer Post Debridement is improved. Post procedure Diagnosis Wound #6: Same as Pre-Procedure General Notes: scribed for Dr [name] by Sharyn Creamer, Rn. 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 There was a Three Layer Compression Therapy Procedure by Sharyn Creamer, RN. Post procedure Diagnosis Wound #6: Same as Pre-Procedure TARRY, FOUNTAIN ([id number]) [id number] Page 12 of 15 Plan Follow-up Appointments: Return Appointment in 1 week. - Dr. [name] Rm 1 Anesthetic: Wound #1 Left,Dorsal Foot: (In clinic) Topical Lidocaine 4% applied to wound bed Wound #3 Right,Medial Lower Leg: (In clinic) Topical Lidocaine 4% applied to wound bed Wound #6 Right,Anterior  Lower Leg: (In clinic) Topical Lidocaine 4% applied to wound bed Bathing/ Shower/ Hygiene: May shower with protection but do not get wound dressing(s) wet. Edema Control - Lymphedema / SCD / Other: Lymphedema Pumps. Use Lymphedema pumps on leg(s) 2-3 times a day for 45-60 minutes. If wearing any  wraps or hose, do not remove them. Continue exercising as instructed. - use 1-2 times per day over wraps Elevate legs to the level of the heart or above for 30 minutes daily and/or when sitting, a frequency of: Exercise regularly The following medication(s) was prescribed: lidocaine topical 4 % cream cream topical once daily for prior to debridement was prescribed at facility WOUND #1: - Foot Wound Laterality: Dorsal, Left Peri-Wound Care: Sween Lotion (Moisturizing lotion) 1 x Per Week/ Discharge Instructions: Apply moisturizing lotion as directed Prim Dressing: KerraCel Ag Gelling Fiber Dressing, 4x5 in (silver alginate) 1 x Per Week/ ary Discharge Instructions: Apply silver alginate to wound bed as instructed Secondary Dressing: Woven Gauze Sponge, Non-Sterile 4x4 in 1 x Per Week/ Discharge Instructions: Apply over primary dressing as directed. Com pression Wrap: ThreePress (3 layer compression wrap) 1 x Per Week/ Discharge Instructions: Apply three layer compression as directed. WOUND #3: - Lower Leg Wound Laterality: Right, Medial Peri-Wound Care: Sween Lotion (Moisturizing lotion) 1 x Per Week/ Discharge Instructions: Apply moisturizing lotion as directed Topical: Gentamicin 1 x Per Week/ Discharge Instructions: As directed by physician Prim Dressing: Santyl Ointment 1 x Per Week/ ary Discharge Instructions: Apply nickel thick amount to wound bed as instructed Prim Dressing: KerraCel Ag Gelling Fiber Dressing, 4x5 in (silver alginate) 1 x Per Week/ ary Discharge Instructions: Apply silver alginate to wound bed as instructed Prim Dressing: Medline Woven Gauze Sponges 4x4 (in/in) 1 x Per Week/ ary Discharge Instructions: pack into wound Secondary Dressing: ABD Pad, 8x10 1 x Per Week/ Discharge Instructions: Apply over primary dressing as directed. Secondary Dressing: Woven Gauze Sponge, Non-Sterile 4x4 in 1 x Per Week/ Discharge Instructions: Apply over primary dressing as  directed. Secondary Dressing: Zetuvit Plus 4x8 in 1 x Per Week/ Discharge Instructions: Apply over primary dressing as directed. Com pression Wrap: ThreePress (3 layer compression wrap) 1 x Per Week/ Discharge Instructions: Apply three layer compression as directed. WOUND #6: - Lower Leg Wound Laterality: Right, Anterior Peri-Wound Care: Sween Lotion (Moisturizing lotion) 1 x Per Week/ Discharge Instructions: Apply moisturizing lotion as directed Topical: Gentamicin 1 x Per Week/ Discharge Instructions: As directed by physician Prim Dressing: Santyl Ointment 1 x Per Week/ ary Discharge Instructions: Apply nickel thick amount to wound bed as instructed Prim Dressing: KerraCel Ag Gelling Fiber Dressing, 4x5 in (silver alginate) 1 x Per Week/ ary Discharge Instructions: Apply silver alginate to wound bed as instructed Prim Dressing: Medline Woven Gauze Sponges 4x4 (in/in) 1 x Per Week/ ary Discharge Instructions: pack into wound Secondary Dressing: ABD Pad, 8x10 1 x Per Week/ Discharge Instructions: Apply over primary dressing as directed. Secondary Dressing: Woven Gauze Sponge, Non-Sterile 4x4 in 1 x Per Week/ Discharge Instructions: Apply over primary dressing as directed. Secondary Dressing: Zetuvit Plus 4x8 in 1 x Per Week/ Discharge Instructions: Apply over primary dressing as directed. Com pression Wrap: ThreePress (3 layer compression wrap) 1 x Per Week/ Discharge Instructions: Apply three layer compression as directed. 06/23/2022: The left dorsal foot wound is about half the size as it was last week. There is just some light slough on the surface and some eschar buildup around the edges. The right anterior leg wound is small with  a little slough on the surface. Unfortunately, the right medial leg wound deteriorated fairly substantially. It is malodorous with gray/green/black discoloration. I used a curette to debride slough from the anterior tibial wound, slough and eschar from the  left dorsal foot wound. And slough, nonviable subcutaneous tissue, and particles of calcium from the right medial leg wound. We will continue silver alginate to the dorsal foot and anterior tibial wound. We are going to mix topical gentamicin with Santyl and apply this to the medial leg wound in an effort to try and clean up the surface again. Continue 3 layer compression. Follow-up in 1 week. Electronic Signature(s) Signed: 06/23/2022 1:26:57 PM By: Fredirick Maudlin MD FACS Entered By: Fredirick Maudlin on 06/23/2022 13:26:56 [name] ([id number]) [id number] Page 13 of 15 -------------------------------------------------------------------------------- HxROS Details Patient Name: Date of Service: [name], [name] 06/23/2022 12:30 PM Medical Record Number: [id number] Patient Account Number: [id number] Date of Birth/Sex: Treating RN: 1950/07/27 (71 y.o. M) Primary Care Provider: PA [name], NO Other Clinician: Referring Provider: Treating Provider/Extender: [name] in Treatment: 19 Information Obtained From Patient Chart Constitutional Symptoms (General Health) Medical History: Past Medical History Notes: obesity Eyes Medical History: Positive for: Cataracts - right eye removed Negative for: Glaucoma; Optic Neuritis Ear/Nose/Mouth/Throat Medical History: Negative for: Chronic sinus problems/congestion; Middle ear problems Past Medical History Notes: hard of hearing Hematologic/Lymphatic Medical History: Positive for: Anemia - macrocytic; Lymphedema Respiratory Medical History: Past Medical History Notes: frozen diaphragm right Cardiovascular Medical History: Positive for: Congestive Heart Failure; Deep Vein Thrombosis - left leg; Hypertension; Peripheral Venous Disease Past Medical History Notes: hyperlipidemia Endocrine Medical History: Negative for: Type I Diabetes; Type II Diabetes Genitourinary Medical  History: Negative for: End Stage Renal Disease Integumentary (Skin) Medical History: Negative for: History of Burn Musculoskeletal Medical History: Past Medical History Notes: myositis, lumbar DDD, spinal stenosis, cervical facet joint syndrome Oncologic Medical HistoryTALBERT, TREMBATH ([id number]) [id number] Page 14 of 15 Negative for: Received Chemotherapy; Received Radiation Psychiatric Medical History: Negative for: Anorexia/bulimia; Confinement Anxiety HBO Extended History Items Eyes: Cataracts Immunizations Pneumococcal Vaccine: Received Pneumococcal Vaccination: Yes Received Pneumococcal Vaccination On or After 60th Birthday: Yes Implantable Devices No devices added Hospitalization / Surgery History Type of Hospitalization/Surgery cervical fusion lumbar surgery coronary stent placement lasik eye surgery hydrocele excision Family and Social History Cancer: Yes - Mother; Diabetes: No; Heart Disease: Yes - Father,Mother; Hereditary Spherocytosis: No; Hypertension: Yes - Father,Siblings; Kidney Disease: No; Lung Disease: Yes - Siblings; Seizures: No; Stroke: No; Thyroid Problems: No; Tuberculosis: No; Former smoker - quit 1991; Marital Status - Married; Alcohol Use: Moderate; Drug Use: No History; Caffeine Use: Daily - coffee; Financial Concerns: No; Food, Clothing or Shelter Needs: No; Support System Lacking: No; Transportation Concerns: No Electronic Signature(s) Signed: 06/23/2022 1:41:48 PM By: Fredirick Maudlin MD FACS Entered By: Fredirick Maudlin on 06/23/2022 13:25:05 -------------------------------------------------------------------------------- SuperBill Details Patient Name: Date of Service: [name], [name] 06/23/2022 Medical Record Number: [id number] Patient Account Number: [id number] Date of Birth/Sex: Treating RN: [birth year] (71 y.o. 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 Quantity: 1 pdf Page 15 of 15 : 76 CPT4 Code: [id number] IC L L Description: 77 - DEBRIDE WOUND 1ST 20 SQ CM OR < D-10 Diagnosis Description 97.522 Non-pressure chronic ulcer of other part of left foot with fat layer exposed 97.812 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d Modifier: 1 Quantity: Physician Procedures : CPT4 Code Description Modifier [id number] - WC PHYS LEVEL 4 - EST PT 25 ICD-10 Diagnosis Description 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d L97.522 Non-pressure chronic ulcer of other part of left  foot with fat layer exposed 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) Quantity: 1 : [id number] 11042 - WC PHYS SUBQ TISS 20 SQ CM ICD-10 Diagnosis Description 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

## 2022-06-24 LAB — NMR, LIPOPROFILE
Cholesterol, Total: 130 mg/dL (ref 100–199)
HDL Particle Number: 38.1 umol/L (ref >=30.5)
HDL-C: 68 mg/dL (ref >39)
LDL Particle Number: 573 nmol/L (ref <1000)
LDL Size: 20 nm — ABNORMAL LOW (ref >20.5)
LDL-C (NIH Calc): 47 mg/dL (ref 0–99)
LP-IR Score: 26 (ref <=45)
Small LDL Particle Number: 336 nmol/L (ref <=527)
Triglycerides: 78 mg/dL (ref 0–149)

## 2022-06-30 ENCOUNTER — Encounter (HOSPITAL_BASED_OUTPATIENT_CLINIC_OR_DEPARTMENT_OTHER): Payer: Medicare Other | Attending: General Surgery | Admitting: General Surgery

## 2022-06-30 DIAGNOSIS — L97522 Non-pressure chronic ulcer of other part of left foot with fat layer exposed: Secondary | ICD-10-CM | POA: Diagnosis not present

## 2022-06-30 DIAGNOSIS — I890 Lymphedema, not elsewhere classified: Secondary | ICD-10-CM | POA: Diagnosis not present

## 2022-06-30 DIAGNOSIS — I251 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: Secondary | ICD-10-CM | POA: Insufficient documentation

## 2022-06-30 DIAGNOSIS — I110 Hypertensive heart disease with heart failure: Secondary | ICD-10-CM | POA: Insufficient documentation

## 2022-06-30 DIAGNOSIS — I872 Venous insufficiency (chronic) (peripheral): Secondary | ICD-10-CM | POA: Diagnosis not present

## 2022-06-30 DIAGNOSIS — I509 Heart failure, unspecified: Secondary | ICD-10-CM | POA: Diagnosis not present

## 2022-06-30 DIAGNOSIS — L97812 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d: Secondary | ICD-10-CM | POA: Insufficient documentation

## 2022-06-30 DIAGNOSIS — L942 Calcinosis cutis: Secondary | ICD-10-CM | POA: Insufficient documentation

## 2022-06-30 DIAGNOSIS — Z86718 Personal history of other venous thrombosis and embolism: Secondary | ICD-10-CM | POA: Diagnosis not present

## 2022-07-01 MED ORDER — ROSUVASTATIN CALCIUM 5 MG PO TABS: 5.0000 mg | ORAL_TABLET | Freq: Every day | ORAL | 3 refills | Status: DC

## 2022-07-01 NOTE — Progress Notes (Signed)
[name] ([id number]) [id number] Page 1 of 12 Visit Report for 06/30/2022 Arrival Information Details Patient Name: Date of Service: [name], [name] 06/30/2022 12:30 PM Medical Record Number: [id number] Patient Account Number: [id number] Date of Birth/Sex: Treating RN: 09/18/1950 (71 y.o. [name] Primary Care Sheppard Luckenbach: PA [name], [place] Other Clinician: Referring Zeeva Courser: Treating Nyzaiah Kai/Extender: [name] in Treatment: 20 Visit Information History Since Last Visit Added or deleted any medications: No Patient Arrived: Wheel Chair Any new allergies or adverse reactions: No Arrival Time: 12:56 Had a fall or experienced change in No Accompanied By: self activities of daily living that may affect Transfer Assistance: None risk of falls: Patient Identification Verified: Yes Signs or symptoms of abuse/neglect since last visito No Secondary Verification Process Completed: Yes Hospitalized since last visit: No Patient Requires Transmission-Based Precautions: No Implantable device outside of the clinic excluding No Patient Has Alerts: Yes cellular tissue based products placed in the center Patient Alerts: R ABI= N/C, TBI= .75 since last visit: L ABI =N/C, TBI = .57 Has Dressing in Place as Prescribed: Yes Has Compression in Place as Prescribed: Yes Pain Present Now: No Electronic Signature(s) Signed: 06/30/2022 4:06:00 PM By: Sharyn Creamer RN, BSN Entered By: Sharyn Creamer on 06/30/2022 92:01:00 -------------------------------------------------------------------------------- Compression Therapy Details Patient Name: Date of Service: [name], [name] 06/30/2022 12:30 PM Medical Record Number: [id number] Patient Account Number: [id number] Date of Birth/Sex: Treating RN: [birth year] (71 y.o. [name] Primary Care Tavia Stave: PA [name], [place] Other Clinician: Referring Brenleigh Collet: Treating Letha Mirabal/Extender: [name] in Treatment: 20 Compression Therapy Performed for Wound Assessment: Wound #3 Right,Medial Lower Leg Performed By: Clinician Sharyn Creamer, RN Compression Type: Three Layer Post Procedure Diagnosis Same as Pre-procedure Electronic Signature(s) Signed: 06/30/2022 4:06:00 PM By: Sharyn Creamer RN, BSN Entered By: Sharyn Creamer on 06/30/2022 14:23:15 [name] ([id number]) [id number] Page 2 of 12 -------------------------------------------------------------------------------- Compression Therapy Details Patient Name: Date of Service: [name], [name] 06/30/2022 12:30 PM Medical Record Number: [id number] Patient Account Number: [id number] Date of Birth/Sex: Treating RN: [birth year] (71 y.o. [name] Primary Care Zuriah Bordas: PA [name], [place] Other Clinician: Referring Jabron Weese: Treating Tamlyn Sides/Extender: [name] in Treatment: 20 Compression Therapy Performed for Wound Assessment: Wound #6 Right,Anterior Lower Leg Performed By: Clinician Sharyn Creamer, RN Compression Type: Three Layer Post Procedure Diagnosis Same as Pre-procedure Electronic Signature(s) Signed: 06/30/2022 4:06:00 PM By: Sharyn Creamer RN, BSN Entered By: Sharyn Creamer on 06/30/2022 14:23:15 -------------------------------------------------------------------------------- Compression Therapy Details Patient Name: Date of Service: [name], [name] 06/30/2022 12:30 PM Medical Record Number: [id number] Patient Account Number: [id number] Date of Birth/Sex: Treating RN: September 09, 1950 (71 y.o. [name] Primary Care Lailynn Southgate: PA [name], [place] Other Clinician: Referring Clarice Zulauf: Treating Saivion Goettel/Extender: [name] in Treatment: 20 Compression Therapy Performed for Wound Assessment: Wound #1 Left,Dorsal Foot Performed By: Clinician Sharyn Creamer, RN Compression Type: Three Layer Post Procedure Diagnosis Same  as Pre-procedure Electronic Signature(s) Signed: 06/30/2022 4:06:00 PM By: Sharyn Creamer RN, BSN Entered By: Sharyn Creamer on 06/30/2022 14:23:15 -------------------------------------------------------------------------------- Encounter Discharge Information Details Patient Name: Date of Service: [name], MURAD 06/30/2022 12:30 PM Medical Record Number: [id number] Patient Account Number: [id number] Date of Birth/Sex: Treating RN: 04/03/1951 (71 y.o. [name] Primary Care Denzal Meir: PA [name], [place] Other Clinician: Referring Tyson Masin: Treating Jayion Schneck/Extender: [name] in Treatment: 20 Encounter Discharge Information Items Post Procedure Vitals Discharge Condition: Stable Temperature (F): 97.8 Ambulatory Status: Wheelchair Pulse (bpm): 61 Discharge Destination: Home Respiratory Rate (  breaths/min): 18 Transportation: Private Auto Blood Pressure (mmHg): 172/80 Accompanied By: self Schedule Follow-up Appointment: Yes Clinical Summary of Care: Patient [name], [name] ([id number]) [id number] Page 3 of 12 Electronic Signature(s) Signed: 06/30/2022 4:06:00 PM By: Sharyn Creamer RN, BSN Entered By: Sharyn Creamer on 06/30/2022 14:25:35 -------------------------------------------------------------------------------- Lower Extremity Assessment Details Patient Name: Date of Service: [name], [name] 06/30/2022 12:30 PM Medical Record Number: [id number] Patient Account Number: [id number] Date of Birth/Sex: Treating RN: 1951/07/10 (71 y.o. [name] Primary Care Layan Zalenski: PA [name], [place] Other Clinician: Referring Cianna Kasparian: Treating Jera Headings/Extender: Valeria Batman Weeks in Treatment: 20 Edema Assessment Assessed: [Left: No] [Right: No] Edema: [Left: Yes] [Right: Yes] Calf Left: Right: Point of Measurement: From Medial Instep 43 cm 49 cm Ankle Left: Right: Point of Measurement: From Medial Instep 24 cm  27 cm Vascular Assessment Pulses: Dorsalis Pedis Palpable: [Left:Yes] [Right:Yes] Electronic Signature(s) Signed: 06/30/2022 4:06:00 PM By: Sharyn Creamer RN, BSN Entered By: Sharyn Creamer on 06/30/2022 12:48:57 -------------------------------------------------------------------------------- Multi Wound Chart Details Patient Name: Date of Service: [name] [name] [name] 06/30/2022 12:30 PM Medical Record Number: [id number] Patient Account Number: [id number] Date of Birth/Sex: Treating RN: [birth year] (71 y.o. M) Primary Care Lonzy Mato: PA [name], [place] Other Clinician: Referring Sahily Biddle: Treating Jonnette Nuon/Extender: [name] in Treatment: 20 Vital Signs Height(in): 71 Pulse(bpm): 61 Weight(lbs): 267 Blood Pressure(mmHg): 172/80 Body Mass Index(BMI): 37.2 Temperature(F): 97.8 Respiratory Rate(breaths/min): 18 [1:Photos:] [[id number] Page 4 of 12] Left, Dorsal Foot Right, Medial Lower Leg Right, Anterior Lower Leg Wound Location: Gradually Appeared Gradually Appeared Gradually Appeared Wounding Event: Lymphedema Lymphedema Calcinosis Primary Etiology: Cataracts, Anemia, Lymphedema, Cataracts, Anemia, Lymphedema, Cataracts, Anemia, Lymphedema, Comorbid History: Congestive Heart Failure, Deep Vein Congestive Heart Failure, Deep Vein Congestive Heart Failure, Deep Vein Thrombosis, Hypertension, Peripheral Thrombosis, Hypertension, Peripheral Thrombosis, Hypertension, Peripheral Venous Disease Venous Disease Venous Disease 12/07/2021 12/07/2021 05/12/2022 Date Acquired: '20 20 7 '$ Weeks of Treatment: Open Open Open Wound Status: No No No Wound Recurrence: 0.9x0.7x0.1 3.5x4.8x0.9 0.5x0.3x0.2 Measurements L x W x D (cm) 0.495 13.195 0.118 A (cm) : rea 0.049 11.875 0.024 Volume (cm) : 86.90% -147.10% 50.00% % Reduction in Area: 96.80% -455.90% 0.00% % Reduction in Volume: Full Thickness Without Exposed Full Thickness With Exposed  Support Full Thickness Without Exposed Classification: Support Structures Structures Support Structures Medium Large Medium Exudate Amount: Serosanguineous Serosanguineous Serosanguineous Exudate Type: red, brown red, brown red, brown Exudate Color: No Yes No Foul Odor A Cleansing: fter N/A No N/A Odor Anticipated Due to Product Use: Distinct, outline attached Distinct, outline attached Epibole Wound Margin: Small (1-33%) Medium (34-66%) Medium (34-66%) Granulation A mount: Red, Hyper-granulation Red, Hyper-granulation Red Granulation Quality: Large (67-100%) Medium (34-66%) Medium (34-66%) Necrotic Amount: Fat Layer (Subcutaneous Tissue): Yes Fat Layer (Subcutaneous Tissue): Yes Fat Layer (Subcutaneous Tissue): Yes Exposed Structures: Fascia: No Fascia: No Fascia: No Tendon: No Tendon: No Tendon: No Muscle: No Muscle: No Muscle: No Joint: No Joint: No Joint: No Bone: No Bone: No Bone: No Small (1-33%) Small (1-33%) None Epithelialization: Debridement - Selective/Open Wound Debridement - Excisional Debridement - Selective/Open Wound Debridement: Pre-procedure Verification/Time Out 13:05 13:05 13:05 Taken: Lidocaine 4% Topical Solution Lidocaine 4% Topical Solution Lidocaine 4% Topical Solution Pain Control: Necrotic/Eschar, Slough Subcutaneous, [employer] Tissue Debrided: Non-Viable Tissue Skin/Subcutaneous Tissue Non-Viable Tissue Level: 0.63 16.8 0.15 Debridement A (sq cm): rea Curette Curette Curette Instrument: Minimum Minimum Minimum Bleeding: Pressure Pressure Pressure Hemostasis A chieved: 0 0 0 Procedural Pain: 0 0 0 Post Procedural Pain: Procedure was tolerated well Procedure was tolerated  well Procedure was tolerated well Debridement Treatment Response: 0.9x0.7x0.1 3.5x4.8x0.9 0.5x0.3x0.2 Post Debridement Measurements L x W x D (cm) 0.049 11.875 0.024 Post Debridement Volume: (cm) Rash: Yes Excoriation: Yes No Abnormalities  Noted Periwound Skin Texture: No Abnormalities Noted No Abnormalities Noted No Abnormalities Noted Periwound Skin Moisture: Erythema: Yes Hemosiderin Staining: Yes Hemosiderin Staining: Yes Periwound Skin Color: Erythema: No Decreased N/A N/A Erythema Change: No Abnormality No Abnormality No Abnormality Temperature: Yes Yes Yes Tenderness on Palpation: Debridement Debridement Debridement Procedures Performed: Treatment Notes Electronic Signature(s) Signed: 06/30/2022 1:29:40 PM By: Fredirick Maudlin MD FACS Entered By: Fredirick Maudlin on 06/30/2022 13:29:40 [name] ([id number]) [id number] Page 5 of 12 -------------------------------------------------------------------------------- Multi-Disciplinary Care Plan Details Patient Name: Date of Service: JACHOB, [name] 06/30/2022 12:30 PM Medical Record Number: [id number] Patient Account Number: [id number] Date of Birth/Sex: Treating RN: July 07, 1951 (71 y.o. [name] Primary Care Kensli Bowley: PA [name], [place] Other Clinician: Referring Jamarquis [name]: Treating Shawndale Kilpatrick/Extender: [name] in Treatment: 20 Multidisciplinary Care Plan reviewed with physician Active Inactive Venous Leg Ulcer Nursing Diagnoses: Knowledge deficit related to disease process and management Potential for venous Insuffiency (use before diagnosis confirmed) Goals: Patient will maintain optimal edema control Date Initiated: 02/17/2022 Target Resolution Date: 08/04/2022 Goal Status: Active Interventions: Assess peripheral edema status every visit. Compression as ordered Provide education on venous insufficiency Treatment Activities: Non-invasive vascular studies : 02/17/2022 Therapeutic compression applied : 02/17/2022 Notes: Wound/Skin Impairment Nursing Diagnoses: Impaired tissue integrity Knowledge deficit related to ulceration/compromised skin integrity Goals: Patient/caregiver will verbalize  understanding of skin care regimen Date Initiated: 02/17/2022 Target Resolution Date: 08/04/2022 Goal Status: Active Ulcer/skin breakdown will have a volume reduction of 30% by week 4 Date Initiated: 02/17/2022 Date Inactivated: 03/10/2022 Target Resolution Date: 03/10/2022 Unmet Reason: infection being treated, Goal Status: Unmet waiting on derm appte Ulcer/skin breakdown will have a volume reduction of 50% by week 8 Date Initiated: 03/10/2022 Date Inactivated: 04/14/2022 Target Resolution Date: 04/07/2022 Goal Status: Unmet Unmet Reason: calcinosis Ulcer/skin breakdown will have a volume reduction of 80% by week 12 Date Initiated: 04/14/2022 Date Inactivated: 05/05/2022 Target Resolution Date: 05/05/2022 Unmet Reason: infection, chronic Goal Status: Unmet condition Interventions: Assess patient/caregiver ability to obtain necessary supplies Assess patient/caregiver ability to perform ulcer/skin care regimen upon admission and as needed Assess ulceration(s) every visit Provide education on ulcer and skin care Treatment Activities: Skin care regimen initiated : 02/17/2022 Topical wound management initiated : 02/17/2022 Notes: Electronic Signature(s) Signed: 06/30/2022 4:06:00 PM By: Sharyn Creamer RN, BSN Entered By: Sharyn Creamer on 06/30/2022 13:05:15 [name] ([id number]) [id number] Page 6 of 12 -------------------------------------------------------------------------------- Pain Assessment Details Patient Name: Date of Service: PADEN, SENGER 06/30/2022 12:30 PM Medical Record Number: [id number] Patient Account Number: [id number] Date of Birth/Sex: Treating RN: [birth year] (71 y.o. [name] Primary Care Makayli Bracken: PA [name], [place] Other Clinician: Referring Morgane Joerger: Treating Jannessa Ogden/Extender: [name] in Treatment: 20 Active Problems Location of Pain Severity and Description of Pain Patient Has Paino No Site  Locations Pain Management and Medication Current Pain Management: Electronic Signature(s) Signed: 06/30/2022 4:06:00 PM By: Sharyn Creamer RN, BSN Entered By: Sharyn Creamer on 06/30/2022 12:57:21 -------------------------------------------------------------------------------- Patient/Caregiver Education Details Patient Name: Date of Service: [name], [name] 12/6/2023andnbsp12:30 PM Medical Record Number: [id number] Patient Account Number: [id number] Date of Birth/Gender: Treating RN: September 16, 1950 (71 y.o. [name] Primary Care Physician: PA [name], [place] Other Clinician: Referring Physician: Treating Physician/Extender: [name] in Treatment: 20 Education Assessment Education Provided To: Patient Education Topics Provided  Wound/Skin Impairment: Methods: Explain/Verbal Responses: State content correctly ADAEL, CULBREATH ([id number]) [id number] Page 7 of 12 Electronic Signature(s) Signed: 06/30/2022 4:06:00 PM By: Sharyn Creamer RN, BSN Entered By: Sharyn Creamer on 06/30/2022 13:05:33 -------------------------------------------------------------------------------- Wound Assessment Details Patient Name: Date of Service: DAMARRION, MIMBS 06/30/2022 12:30 PM Medical Record Number: [id number] Patient Account Number: [id number] Date of Birth/Sex: Treating RN: August 18, 1950 (71 y.o. [name] Primary Care Teaira Croft: PA [name], [place] Other Clinician: Referring Boluwatife Flight: Treating Aquanetta Schwarz/Extender: Valeria Batman Weeks in Treatment: 20 Wound Status Wound Number: 1 Primary Lymphedema Etiology: Wound Location: Left, Dorsal Foot Wound Open Wounding Event: Gradually Appeared Status: Date Acquired: 12/07/2021 Comorbid Cataracts, Anemia, Lymphedema, Congestive Heart Failure, Deep Weeks Of Treatment: 20 History: Vein Thrombosis, Hypertension, Peripheral Venous Disease Clustered Wound: No Photos Wound Measurements Length:  (cm) 0.9 Width: (cm) 0.7 Depth: (cm) 0.1 Area: (cm) 0.495 Volume: (cm) 0.049 % Reduction in Area: 86.9% % Reduction in Volume: 96.8% Epithelialization: Small (1-33%) Tunneling: No Undermining: No Wound Description Classification: Full Thickness Without Exposed Suppor Wound Margin: Distinct, outline attached Exudate Amount: Medium Exudate Type: Serosanguineous Exudate Color: red, brown t Structures Foul Odor After Cleansing: No Slough/Fibrino Yes Wound Bed Granulation Amount: Small (1-33%) Exposed Structure Granulation Quality: Red, Hyper-granulation Fascia Exposed: No Necrotic Amount: Large (67-100%) Fat Layer (Subcutaneous Tissue) Exposed: Yes Necrotic Quality: Adherent Slough Tendon Exposed: No Muscle Exposed: No Joint Exposed: No Bone Exposed: No Periwound Skin Texture Texture Color No Abnormalities Noted: Yes No Abnormalities Noted: Yes Erythema Change: Decreased Moisture No Abnormalities Noted: Yes Temperature / Pain Temperature: No Abnormality Nes[place], Fayez ([id number]) [id number] Page 8 of 12 Tenderness on Palpation: Yes Treatment Notes Wound #1 (Foot) Wound Laterality: Dorsal, Left Cleanser Peri-Wound Care Sween Lotion (Moisturizing lotion) Discharge Instruction: Apply moisturizing lotion as directed Topical Primary Dressing KerraCel Ag Gelling Fiber Dressing, 4x5 in (silver alginate) Discharge Instruction: Apply silver alginate to wound bed as instructed Secondary Dressing Woven Gauze Sponge, Non-Sterile 4x4 in Discharge Instruction: Apply over primary dressing as directed. Secured With Compression Wrap ThreePress (3 layer compression wrap) Discharge Instruction: Apply three layer compression as directed. Compression Stockings Add-Ons Electronic Signature(s) Signed: 06/30/2022 4:06:00 PM By: Sharyn Creamer RN, BSN Entered By: Sharyn Creamer on 06/30/2022  12:55:02 -------------------------------------------------------------------------------- Wound Assessment Details Patient Name: Date of Service: [name], [name] 06/30/2022 12:30 PM Medical Record Number: [id number] Patient Account Number: [id number] Date of Birth/Sex: Treating RN: January 19, 1951 (71 y.o. [name] Primary Care Ashana Tullo: PA [name], [place] Other Clinician: Referring Ahyan Kreeger: Treating Mana Morison/Extender: Valeria Batman Weeks in Treatment: 20 Wound Status Wound Number: 3 Primary Lymphedema Etiology: Wound Location: Right, Medial Lower Leg Wound Open Wounding Event: Gradually Appeared Status: Date Acquired: 12/07/2021 Comorbid Cataracts, Anemia, Lymphedema, Congestive Heart Failure, Deep Weeks Of Treatment: 20 History: Vein Thrombosis, Hypertension, Peripheral Venous Disease Clustered Wound: No Photos [name], [name] ([id number]) [id number] Page 9 of 12 Wound Measurements Length: (cm) 3.5 Width: (cm) 4.8 Depth: (cm) 0.9 Area: (cm) 13.195 Volume: (cm) 11.875 % Reduction in Area: -147.1% % Reduction in Volume: -455.9% Epithelialization: Small (1-33%) Tunneling: No Undermining: No Wound Description Classification: Full Thickness With Exposed Suppo Wound Margin: Distinct, outline attached Exudate Amount: Large Exudate Type: Serosanguineous Exudate Color: red, brown rt Structures Foul Odor After Cleansing: Yes Due to Product Use: No Slough/Fibrino Yes Wound Bed Granulation Amount: Medium (34-66%) Exposed Structure Granulation Quality: Red, Hyper-granulation Fascia Exposed: No Necrotic Amount: Medium (34-66%) Fat Layer (Subcutaneous Tissue) Exposed: Yes Necrotic Quality: Adherent Slough Tendon Exposed: No Muscle Exposed: No Joint Exposed: No Bone  Exposed: No Periwound Skin Texture Texture Color No Abnormalities Noted: Yes No Abnormalities Noted: No Erythema: No Moisture Hemosiderin Staining: Yes No Abnormalities  Noted: Yes Temperature / Pain Temperature: No Abnormality Tenderness on Palpation: Yes Treatment Notes Wound #3 (Lower Leg) Wound Laterality: Right, Medial Cleanser Peri-Wound Care Sween Lotion (Moisturizing lotion) Discharge Instruction: Apply moisturizing lotion as directed Topical Gentamicin Discharge Instruction: As directed by physician Primary Dressing Santyl Ointment Discharge Instruction: Apply nickel thick amount to wound bed as instructed KerraCel Ag Gelling Fiber Dressing, 4x5 in (silver alginate) Discharge Instruction: Apply silver alginate to wound bed as instructed Medline Woven Gauze Sponges 4x4 (in/in) Discharge Instruction: pack into wound Secondary Dressing ABD Pad, 8x10 Discharge Instruction: Apply over primary dressing as directed. Woven Gauze Sponge, Non-Sterile 4x4 in Discharge Instruction: Apply over primary dressing as directed. Zetuvit Plus 4x8 in Discharge Instruction: Apply over primary dressing as directed. Secured With Compression Wrap ThreePress (3 layer compression wrap) Discharge Instruction: Apply three layer compression as directed. Compression Stockings Add-Ons CATRELL, MORRONE ([id number]) [id number] Page 10 of 12 Electronic Signature(s) Signed: 06/30/2022 4:06:00 PM By: Sharyn Creamer RN, BSN Entered By: Sharyn Creamer on 06/30/2022 12:55:49 -------------------------------------------------------------------------------- Wound Assessment Details Patient Name: Date of Service: [name], [name] 06/30/2022 12:30 PM Medical Record Number: [id number] Patient Account Number: [id number] Date of Birth/Sex: Treating RN: 06/07/1951 (71 y.o. [name] Primary Care Jennyfer Nickolson: PA [name], [place] Other Clinician: Referring Otilio Groleau: Treating Jasani Lengel/Extender: Valeria Batman Weeks in Treatment: 20 Wound Status Wound Number: 6 Primary Calcinosis Etiology: Wound Location: Right, Anterior Lower Leg Wound  Open Wounding Event: Gradually Appeared Status: Date Acquired: 05/12/2022 Comorbid Cataracts, Anemia, Lymphedema, Congestive Heart Failure, Deep Weeks Of Treatment: 7 History: Vein Thrombosis, Hypertension, Peripheral Venous Disease Clustered Wound: No Photos Wound Measurements Length: (cm) 0.5 Width: (cm) 0.3 Depth: (cm) 0.2 Area: (cm) 0.118 Volume: (cm) 0.024 % Reduction in Area: 50% % Reduction in Volume: 0% Epithelialization: None Tunneling: No Undermining: No Wound Description Classification: Full Thickness Without Exposed Suppor Wound Margin: Epibole Exudate Amount: Medium Exudate Type: Serosanguineous Exudate Color: red, brown t Structures Foul Odor After Cleansing: No Slough/Fibrino Yes Wound Bed Granulation Amount: Medium (34-66%) Exposed Structure Granulation Quality: Red Fascia Exposed: No Necrotic Amount: Medium (34-66%) Fat Layer (Subcutaneous Tissue) Exposed: Yes Necrotic Quality: Adherent Slough Tendon Exposed: No Muscle Exposed: No Joint Exposed: No Bone Exposed: No Periwound Skin Texture Texture Color No Abnormalities Noted: Yes No Abnormalities Noted: No Hemosiderin Staining: Yes Moisture No Abnormalities Noted: Yes Temperature / Pain Temperature: No Abnormality Tenderness on PalpationDEQUAVIUS, [name] ([id number]) [id number] Page 11 of 12 Treatment Notes Wound #6 (Lower Leg) Wound Laterality: Right, Anterior Cleanser Peri-Wound Care Sween Lotion (Moisturizing lotion) Discharge Instruction: Apply moisturizing lotion as directed Topical Gentamicin Discharge Instruction: As directed by physician Primary Dressing Santyl Ointment Discharge Instruction: Apply nickel thick amount to wound bed as instructed KerraCel Ag Gelling Fiber Dressing, 4x5 in (silver alginate) Discharge Instruction: Apply silver alginate to wound bed as instructed Medline Woven Gauze Sponges 4x4 (in/in) Discharge Instruction: pack into  wound Secondary Dressing ABD Pad, 8x10 Discharge Instruction: Apply over primary dressing as directed. Woven Gauze Sponge, Non-Sterile 4x4 in Discharge Instruction: Apply over primary dressing as directed. Zetuvit Plus 4x8 in Discharge Instruction: Apply over primary dressing as directed. Secured With Compression Wrap ThreePress (3 layer compression wrap) Discharge Instruction: Apply three layer compression as directed. Compression Stockings Add-Ons Electronic Signature(s) Signed: 06/30/2022 4:06:00 PM By: Sharyn Creamer RN, BSN Entered By: Sharyn Creamer on 06/30/2022 12:56:20 -------------------------------------------------------------------------------- Vitals Details  Patient Name: Date of Service: [name], [name] 06/30/2022 12:30 PM Medical Record Number: [id number] Patient Account Number: [id number] Date of Birth/Sex: Treating RN: 09/11/1950 (71 y.o. [name] Primary Care Jade Burkard: PA [name], [place] Other Clinician: Referring Efrem Pitstick: Treating Quiara Killian/Extender: [name] in Treatment: 20 Vital Signs Time Taken: 12:40 Temperature (F): 97.8 Height (in): 71 Pulse (bpm): 61 Weight (lbs): 267 Respiratory Rate (breaths/min): 18 Body Mass Index (BMI): 37.2 Blood Pressure (mmHg): 172/80 Reference Range: 80 - 120 mg / dl Electronic Signature(s) Signed: 06/30/2022 4:06:00 PM By: Sharyn Creamer RN, BSN [place], [name] [place] ([id number]) [id number] Page 12 of 12 Entered By: Sharyn Creamer on 06/30/2022 12:57:15

## 2022-07-01 NOTE — Progress Notes (Signed)
KEHINDE, BOWDISH ([id number]) [id number] Page 1 of 15 Visit Report for 06/30/2022 Chief Complaint Document Details Patient Name: Date of Service: [name], [name] 06/30/2022 12:30 PM Medical Record Number: [id number] Patient Account Number: [id number] Date of Birth/Sex: Treating RN: 03/10/1951 (71 y.o. M) Primary Care Provider: PA [name], NO Other Clinician: Referring Provider: Treating Provider/Extender: [name] in Treatment: 20 Information Obtained from: Patient Chief Complaint Patient seen for complaints of Non-Healing Wounds. Electronic Signature(s) Signed: 06/30/2022 1:29:49 PM By: Fredirick Maudlin MD FACS Entered By: Fredirick Maudlin on 06/30/2022 13:29:49 -------------------------------------------------------------------------------- Debridement Details Patient Name: Date of Service: [name], [name] 06/30/2022 12:30 PM Medical Record Number: [id number] Patient Account Number: [id number] Date of Birth/Sex: Treating RN: [birth year] (71 y.o. [name] Primary Care Provider: PA [name], [place] Other Clinician: Referring Provider: Treating Provider/Extender: [name] in Treatment: 20 Debridement Performed for Assessment: Wound #1 Left,Dorsal Foot Performed By: Physician Fredirick Maudlin, MD Debridement Type: Debridement Level of Consciousness (Pre-procedure): Awake and Alert Pre-procedure Verification/Time Out Yes - 13:05 Taken: Start Time: 13:09 Pain Control: Lidocaine 4% T opical Solution T Area Debrided (L x W): otal 0.9 (cm) x 0.7 (cm) = 0.63 (cm) Tissue and other material debrided: Non-Viable, Eschar, Slough, Slough Level: Non-Viable Tissue Debridement Description: Selective/Open Wound Instrument: Curette Bleeding: Minimum Hemostasis Achieved: Pressure Procedural Pain: 0 Post Procedural Pain: 0 Response to Treatment: Procedure was tolerated well Level of Consciousness (Post- Awake and  Alert procedure): Post Debridement Measurements of Total Wound Length: (cm) 0.9 Width: (cm) 0.7 Depth: (cm) 0.1 Volume: (cm) 0.049 Character of Wound/Ulcer Post Debridement: Improved Post Procedure Diagnosis Same as [name], [name] ([id number]) [id number] Page 2 of 15 Notes scribed for Dr [name] by Sharyn Creamer, Rn Electronic Signature(s) Signed: 06/30/2022 4:06:00 PM By: Sharyn Creamer RN, BSN Signed: 06/30/2022 5:07:18 PM By: Fredirick Maudlin MD FACS Entered By: Sharyn Creamer on 06/30/2022 13:11:17 -------------------------------------------------------------------------------- Debridement Details Patient Name: Date of Service: [name], [name] 06/30/2022 12:30 PM Medical Record Number: [id number] Patient Account Number: [id number] Date of Birth/Sex: Treating RN: 1950-12-03 (71 y.o. [name] Primary Care Provider: PA [name], NO Other Clinician: Referring Provider: Treating Provider/Extender: [name] in Treatment: 20 Debridement Performed for Assessment: Wound #3 Right,Medial Lower Leg Performed By: Physician Fredirick Maudlin, MD Debridement Type: Debridement Level of Consciousness (Pre-procedure): Awake and Alert Pre-procedure Verification/Time Out Yes - 13:05 Taken: Start Time: 13:09 Pain Control: Lidocaine 4% T opical Solution T Area Debrided (L x W): otal 3.5 (cm) x 4.8 (cm) = 16.8 (cm) Tissue and other material debrided: Non-Viable, Slough, Subcutaneous, Slough Level: Skin/Subcutaneous Tissue Debridement Description: Excisional Instrument: Curette Bleeding: Minimum Hemostasis Achieved: Pressure Procedural Pain: 0 Post Procedural Pain: 0 Response to Treatment: Procedure was tolerated well Level of Consciousness (Post- Awake and Alert procedure): Post Debridement Measurements of Total Wound Length: (cm) 3.5 Width: (cm) 4.8 Depth: (cm) 0.9 Volume: (cm) 11.875 Character of Wound/Ulcer Post  Debridement: Improved Post Procedure Diagnosis Same as Pre-procedure Notes scribed for Dr [name] by Sharyn Creamer, Rn Electronic Signature(s) Signed: 06/30/2022 4:06:00 PM By: Sharyn Creamer RN, BSN Signed: 06/30/2022 5:07:18 PM By: Fredirick Maudlin MD FACS Entered By: Sharyn Creamer on 06/30/2022 13:12:24 Debridement Details -------------------------------------------------------------------------------- [name] ([id number]) [id number] Page 3 of 15 Patient Name: Date of Service: [name], [name] 06/30/2022 12:30 PM Medical Record Number: [id number] Patient Account Number: [id number] Date of Birth/Sex: Treating RN: Jul 06, 1951 (71 y.o. [name] Primary Care Provider: PA [name], NO Other Clinician: Referring Provider: Treating Provider/Extender: Fredirick Maudlin  Nicholes Rough Weeks in Treatment: 20 Debridement Performed for Assessment: Wound #6 Right,Anterior Lower Leg Performed By: Physician Fredirick Maudlin, MD Debridement Type: Debridement Level of Consciousness (Pre-procedure): Awake and Alert Pre-procedure Verification/Time Out Yes - 13:05 Taken: Start Time: 13:09 Pain Control: Lidocaine 4% T opical Solution T Area Debrided (L x W): otal 0.5 (cm) x 0.3 (cm) = 0.15 (cm) Tissue and other material debrided: Non-Viable, Slough, Slough Level: Non-Viable Tissue Debridement Description: Selective/Open Wound Instrument: Curette Bleeding: Minimum Hemostasis Achieved: Pressure Procedural Pain: 0 Post Procedural Pain: 0 Response to Treatment: Procedure was tolerated well Level of Consciousness (Post- Awake and Alert procedure): Post Debridement Measurements of Total Wound Length: (cm) 0.5 Width: (cm) 0.3 Depth: (cm) 0.2 Volume: (cm) 0.024 Character of Wound/Ulcer Post Debridement: Improved Post Procedure Diagnosis Same as Pre-procedure Notes scribed for Dr [name] by Sharyn Creamer, Rn Electronic Signature(s) Signed: 06/30/2022 4:06:00 PM  By: Sharyn Creamer RN, BSN Signed: 06/30/2022 5:07:18 PM By: Fredirick Maudlin MD FACS Entered By: Sharyn Creamer on 06/30/2022 13:12:57 -------------------------------------------------------------------------------- HPI Details Patient Name: Date of Service: [name], [name] 06/30/2022 12:30 PM Medical Record Number: [id number] Patient Account Number: [id number] Date of Birth/Sex: Treating RN: 11/17/1950 (71 y.o. M) Primary Care Provider: PA [name], NO Other Clinician: Referring Provider: Treating Provider/Extender: [name] in Treatment: 20 History of Present Illness HPI Description: ADMISSION 02/09/2022 This is a 71 year old man who recently moved to [place] from [place]. He has a history of of coronary artery disease and prior left popliteal vein DVT . He is not diabetic and does not smoke. 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 He has had multiple spinal surgeries and has limited mobility. He primarily uses a motorized wheelchair. He has had lymphedema for many years and does have lymphedema pumps although he says he does not use them frequently. He has wounds on his bilateral lower extremities. He was receiving care in [place] for these prior to his move. They were apparently packing his wounds with Betadine soaked gauze. He has worn compression wraps in the past but not recently. He did say that they helped tremendously. In clinic today, his right ABI is noncompressible but has good signals; the left ABI was 1.17. No formal vascular studies have been performed. On his left dorsal foot, there is a circular lesion that exposes the fat layer. There is fairly heavy slough accumulation within the wound, but no significant odor. On his right medial calf, he has multiple pinholes that have slough buildup in them and probe for about 2 to 4 mm in depth. They are draining clear fluid. On his right lateral midfoot, he has ulceration consistent with  venous stasis. It is geographic and has slough accumulation. He has changes in his lower extremities [name], [name] ([id number]) [id number] Page 4 of 15 consistent with stasis dermatitis, but no hemosiderin deposition or thickening of the skin. 02/17/2022: All of the wounds are little bit larger today and have more slough accumulation. Today, the medial calf openings are larger and probe to something that feels calcified. There is odor coming from his wounds. His vascular studies are scheduled for August 9 and 10. 02-24-2022 upon evaluation today patient presents for follow-up concerning his ongoing issues here with his lower extremities. With that being said he does have significant problems with what appears to be cellulitis unfortunately the PCR culture that was sent last week got crushed by UPS in transit. With that being said we are going to reobtain a culture  today although I am actually to do it on the right medial leg as this seems to be the most inflamed area today where there is some questionable drainage as well. I am going to remove some of the calcium deposits which are loosening obtain a deeper culture from this area. Fortunately I do not see any evidence of active infection systemically which is good news but locally I think we are having a bigger issue here. He does have his appointment next Thursday with vascular. Patient has also been referred to [facility] for further evaluation and treatment of the calcinosis for possible sulfate injections. 03/04/2022: The culture that was performed last week grew out multiple species including Klebsiella, Morganella, and Pseudomonas aeruginosa. He had been prescribed Bactrim but this was not adequate to cover all of the species and levofloxacin was recommended. He completed the Bactrim but did not pick up the levofloxacin and begin taking it until yesterday. 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 He had arterial studies performed today. He does have evidence of peripheral arterial disease. Results copied here: +-------+-----------+-----------+------------+------------+ ABI/TBIT oday's ABIT oday's TBIPrevious ABIPrevious TBI +-------+-----------+-----------+------------+------------+ Right [place] 0.75    +-------+-----------+-----------+------------+------------+ Left [place] 0.57    +-------+-----------+-----------+------------+------------+ Arterial wall calcification precludes accurate ankle pressures and ABIs. Summary: Right: Resting right ankle-brachial index indicates noncompressible right lower extremity arteries. The right toe-brachial index is normal. Left: Resting left ankle-brachial index indicates noncompressible left lower extremity arteries. The left toe-brachial index is abnormal. He continues to have further tissue breakdown at the right medial calf and there is ample slough accumulation along with necrotic subcutaneous tissue. The left dorsal foot wound has built up slough, as well. The right medial foot wound is nearly closed with just a light layer of eschar over the surface. The right dorsal lateral foot wound has also accumulated a fair amount of slough and remains quite tender. 03/10/2022: He has been taking the prescribed levofloxacin and has about 3 days left of this. The erythema and induration on his right leg has improved. He continues to experience further breakdown of the right medial calf wound. There is extensive slough and debris accumulation there. There is heavy slough on his left dorsal foot wound, but no odor or purulent drainage. The right medial foot wound appears to be closed. The right dorsal lateral foot wound also has a fair amount of slough but it is less tender than at our last visit. He  still does not have an appointment with dermatology. 03/22/2022: The right anterior tibial wound has closed. The right lateral foot wound is nearly closed with just a little bit of slough. The left dorsal foot wound is smaller and continues to have some slough buildup but less so than at prior visits. There is good granulation tissue underlying the slough. Unfortunately the right medial calf wound continues to deteriorate. It has a foul odor today and a lot of necrotic tissue, the surrounding skin is also erythematous and moist. 03/30/2022: The right lateral foot wound continues to contract and just has a little bit of slough and eschar present. The left dorsal foot wound is also smaller with slough overlying good granulation tissue. The right medial calf wound actually looks quite a bit better today; there is no odor and there is much less necrotic tissue although some remains present. We have been using topical gentamicin and  he has been taking oral Augmentin. 04/07/2022: The patient saw dermatology at [facility] last week. Unfortunately, it appears that they did not read any of the documentation that was provided. 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 Overall, it was a highly unsatisfactory consultation. In the meantime, however, we were able to identify compounding pharmacy who could create a topical sodium thiosulfate ointment. The patient has that with him today. Both of the right foot wounds are now closed. The left dorsal foot wound has contracted but still has a layer of slough on the surface. The medial calf wounds on the right all look a little bit better. They still have heavy slough along with the chunks of calcium.  Everything is stained purple. 04/14/2022: The wound on his dorsal left foot is a little bit smaller again today. There is still slough accumulation on the surface. The medial calf wounds have quite a bit less slough accumulation today. His right leg, however, is warm and erythematous, concerning for cellulitis. 04/14/2022: He completed his course of Augmentin yesterday. The medial calf wound sites are much cleaner today and shallower. There is less fragmented calcium in the wounds. He has a lot of lymphedema fluid-like drainage from these wounds, but no purulent discharge or malodor. The wound on his dorsal left foot has also contracted and is shallower. There is a layer of slough over healthy granulation tissue. 05/05/2022: The left dorsal foot wound is smaller today with less slough and more granulation tissue. The right medial calf wounds are shallower, but have extensive slough and some fat necrosis. The drainage has diminished considerably, however. 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 05/12/2022: The left dorsal foot wound continues to contract and fill with granulation tissue. There is slough on the wound surface. The right medial calf wounds also continue to fill with healthy tissue, but there is remaining slough and fat necrosis present. He had a significant increase in his drainage this week and it was a bright yellow-green color. He also had a new wound open over his right anterior tibial surface associated with the spike of cutaneous calcium. The leg is more red, as well, concerning for infection. 05/19/2022: His culture returned positive for Pseudomonas. He is currently taking a course of oral levofloxacin. We have also been using topical gentamicin mixed in with his sodium thiosulfate. All of the wounds look better this week. The ones associated with his calcinosis  cutis have filled in substantially. There is slough and nonviable subcutaneous tissue still present. The new anterior tibial wound just has a light layer of slough. The dorsal foot wound also has some slough present and appears a little dry today. 05/26/2022: The right medial leg wound continues to improve. It is feeling with granulation tissue, but still accumulates a fairly thick layer of slough on the surface. The more recent anterior tibial wound has remained quite small, but also has some slough on the surface. The left dorsal foot wound has contracted somewhat and has perimeter epithelialization. He completed his oral levofloxacin. 06/02/2022: The left dorsal foot wound continues to improve significantly. There is just a little slough on the wound  surface. The right medial leg wound had black drainage, but it looks like silver alginate was applied last week and I theorized that silver sulfide was created with a combination of the silver alginate and the [name], [name] ([id number]) [id number] Page 5 of 15 sodium thiosulfate. It did, however, have a bit of an odor to it and extensive slough accumulation. The small anterior tibial wound also had a bit of slough and nonviable subcutaneous tissue present. The skin of his leg was rather macerated, as well. 06/09/2022: The left dorsal foot wound is smaller again this week with just a light layer of slough on the surface. The right medial leg wound looks substantially better. The leg and periwound skin are no longer red and macerated. There is good granulation tissue forming with less slough and nonviable tissue. The anterior tibial wound just has a small amount of slough accumulation. 06/16/2022: The left dorsal foot wound continues to contract. His wrap slipped a little bit, however, and his foot is more swollen. The right medial leg wound continues to improve. The underlying tissue is more vital-appearing and there is less  slough. He does have substantial drainage. The small anterior tibial wound has a bit of slough accumulation. 06/23/2022: The left dorsal foot wound is about half the size as it was last week. There is just some light slough on the surface and some eschar buildup around the edges. The right anterior leg wound is small with a little slough on the surface. Unfortunately, the right medial leg wound deteriorated fairly substantially. It is malodorous with gray/green/black discoloration. 06/30/2022: The left dorsal foot wound continues to contract. It is nearly closed with just a light layer of slough present. The right anterior leg wound is about the same size and has a small amount of slough on the surface. The right medial leg wound looks a lot better than it did last week. The odor has abated and the tissue is less pulpy and necrotic-looking. There is still a fairly thick layer of slough present. Electronic Signature(s) Signed: 06/30/2022 1:31:06 PM By: Fredirick Maudlin MD FACS Entered By: Fredirick Maudlin on 06/30/2022 13:31:06 -------------------------------------------------------------------------------- Physical Exam Details Patient Name: Date of Service: [name], [name] 06/30/2022 12:30 PM Medical Record Number: [id number] Patient Account Number: [id number] Date of Birth/Sex: Treating RN: [birth year] (71 y.o. M) Primary Care Provider: PA [name], NO Other Clinician: Referring Provider: Treating Provider/Extender: [name], [name] in Treatment: 20 Constitutional He is hypertensive, but asymptomatic.. . . . No acute distress. Respiratory Normal work of breathing on room air. Notes 06/30/2022: The left dorsal foot wound continues to contract. It is nearly closed with just a light layer of slough present. The right anterior leg wound is about the same size and has a small amount of slough on the surface. The right medial leg wound looks a lot better than it did last week. The  odor has abated and the tissue is less pulpy and necrotic-looking. There is still a fairly thick layer of slough present. Electronic Signature(s) Signed: 06/30/2022 1:31:34 PM By: Fredirick Maudlin MD FACS Entered By: Fredirick Maudlin on 06/30/2022 13:31:34 -------------------------------------------------------------------------------- Physician Orders Details Patient Name: Date of Service: [name], [name] 06/30/2022 12:30 PM Medical Record Number: [id number] Patient Account Number: [id number] Date of Birth/Sex: Treating RN: January 10, 1951 (71 y.o. [name] Primary Care Provider: PA [name], [place] Other Clinician: Referring Provider: Treating Provider/Extender: [name] in Treatment: 20 Verbal / Phone Orders: No Diagnosis Coding ICD-10 Coding Code Description Cassady,  [name] ([id number]) [id number] Page 6 of 15 [phone] 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d L97.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) I10 Essential (primary) hypertension I25.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 - Dr. [name] Rm 1 Return A Anesthetic Wound #1 Left,Dorsal Foot (In clinic) Topical Lidocaine 4% applied to wound bed Wound #3 Right,Medial Lower Leg (In clinic) Topical Lidocaine 4% applied to wound bed Wound #6 Right,Anterior Lower Leg (In clinic) Topical Lidocaine 4% applied to wound bed Bathing/ Shower/ Hygiene May shower with protection but do not get wound dressing(s) wet. Edema Control - Lymphedema / SCD / Other Lymphedema Pumps. Use Lymphedema pumps on leg(s) 2-3 times a day for 45-60 minutes. If  wearing any wraps or hose, do not remove them. Continue exercising as instructed. - use 1-2 times per day over wraps Elevate legs to the level of the heart or above for 30 minutes daily and/or when sitting, a frequency of: Exercise regularly Wound Treatment Wound #1 - Foot Wound Laterality: Dorsal, Left Peri-Wound Care: Sween Lotion (Moisturizing lotion) 1 x Per Week Discharge Instructions: Apply moisturizing lotion as directed Prim Dressing: KerraCel Ag Gelling Fiber Dressing, 4x5 in (silver alginate) 1 x Per Week ary Discharge Instructions: Apply silver alginate to wound bed as instructed Secondary Dressing: Woven Gauze Sponge, Non-Sterile 4x4 in 1 x Per Week Discharge Instructions: Apply over primary dressing as directed. Compression Wrap: ThreePress (3 layer compression wrap) 1 x Per Week Discharge Instructions: Apply three layer compression as directed. Wound #3 - Lower Leg Wound Laterality: Right, Medial Peri-Wound Care: Sween Lotion (Moisturizing lotion) 1 x Per Week Discharge Instructions: Apply moisturizing lotion as directed Topical: Gentamicin 1 x Per Week Discharge Instructions: As directed by physician Prim Dressing: Santyl Ointment 1 x Per Week ary Discharge Instructions: Apply nickel thick amount to wound bed as instructed Prim Dressing: KerraCel Ag Gelling Fiber Dressing, 4x5 in (silver alginate) 1 x Per Week ary Discharge Instructions: Apply silver alginate to wound bed as instructed Prim Dressing: Medline Woven Gauze Sponges 4x4 (in/in) ary 1 x Per Week Discharge Instructions: pack into wound Secondary Dressing: ABD Pad, 8x10 1 x Per Week Discharge Instructions: Apply over primary dressing as directed. Secondary Dressing: Woven Gauze Sponge, Non-Sterile 4x4 in 1 x Per Week Discharge Instructions: Apply over primary dressing as directed. Secondary Dressing: Zetuvit Plus 4x8 in 1 x Per Week Discharge Instructions: Apply over primary dressing as directed. Compression  Wrap: ThreePress (3 layer compression wrap) 1 x Per Week Discharge Instructions: Apply three layer compression as directed. Wound #6 - Lower Leg Wound Laterality: Right, Anterior Peri-Wound Care: Sween Lotion (Moisturizing lotion) 1 x Per Week/30 Days DEIDRICK, RAINEY ([id number]) [id number] Page 7 of 15 Discharge Instructions: Apply moisturizing lotion as directed Topical: Gentamicin 1 x Per Week/30 Days Discharge Instructions: As directed by physician Prim Dressing: Santyl Ointment 1 x Per Week/30 Days ary Discharge Instructions: Apply nickel thick amount to wound bed as instructed Prim Dressing: KerraCel Ag Gelling Fiber Dressing, 4x5 in (silver alginate) 1 x Per Week/30 Days ary Discharge Instructions: Apply silver alginate to wound bed as instructed Prim Dressing: Medline Woven Gauze Sponges 4x4 (in/in) ary 1 x Per Week/30 Days Discharge Instructions: pack into wound Secondary Dressing: ABD Pad, 8x10 1 x  Per Week/30 Days Discharge Instructions: Apply over primary dressing as directed. Secondary Dressing: Woven Gauze Sponge, Non-Sterile 4x4 in 1 x Per Week/30 Days Discharge Instructions: Apply over primary dressing as directed. Secondary Dressing: Zetuvit Plus 4x8 in 1 x Per Week/30 Days Discharge Instructions: Apply over primary dressing as directed. Compression Wrap: ThreePress (3 layer compression wrap) 1 x Per Week/30 Days Discharge Instructions: Apply three layer compression as directed. Patient Medications llergies: No Known Allergies A Notifications Medication Indication Start End prior to debridement 06/30/2022 lidocaine DOSE topical 4 % cream - cream topical once daily Electronic Signature(s) Signed: 06/30/2022 5:07:18 PM By: Fredirick Maudlin MD FACS Entered By: Fredirick Maudlin on 06/30/2022 13:32:15 -------------------------------------------------------------------------------- Problem List Details Patient Name: Date of Service: [name] [place]  [name] 06/30/2022 12:30 PM Medical Record Number: [id number] Patient Account Number: [id number] Date of Birth/Sex: Treating RN: 05-01-1951 (71 y.o. 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 No Yes [name], [name] ([id number]) [id number] Page 8 of 15 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) 02/09/2022 No Yes I10 Essential (primary) hypertension 02/09/2022 No Yes I25.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 02/09/2022 No Yes I89.0 Lymphedema, not elsewhere classified 02/09/2022 No Yes Z86.718 Personal history of other venous thrombosis and embolism 02/09/2022 No Yes Inactive Problems ICD-10 Code Description Active Date Inactive Date L97.512 Non-pressure chronic ulcer of other part of right foot with fat layer exposed 02/17/2022 02/17/2022 Resolved Problems Electronic Signature(s) Signed: 06/30/2022 1:29:25 PM By: Fredirick Maudlin MD FACS Entered By: Fredirick Maudlin on 06/30/2022 13:29:25 -------------------------------------------------------------------------------- Progress Note Details Patient Name: Date of Service: [name] 06/30/2022 12:30 PM Medical Record Number: [id number] Patient Account Number: [id number] Date of Birth/Sex: Treating RN: 03/28/1951 (71 y.o. M) Primary Care Provider: PA [name], NO Other Clinician: Referring Provider: Treating Provider/Extender: [name] in Treatment: 20 Subjective Chief Complaint Information obtained from  Patient Patient seen for complaints of Non-Healing Wounds. History of Present Illness (HPI) ADMISSION 02/09/2022 This is a 71 year old man who recently moved to [place] from [place]. He has a history of of coronary artery disease and prior left popliteal vein DVT . He is not diabetic and does not smoke. 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 He has had multiple spinal surgeries and has limited mobility. He primarily uses a motorized wheelchair. He has had lymphedema for many years and does have lymphedema pumps although he says he does not use them frequently. He has wounds on his bilateral lower extremities. He was receiving care in [place] for these prior to his move. They were apparently packing his wounds with Betadine soaked gauze. He has worn compression wraps in the past but not recently. He did say that they helped tremendously. In clinic today, his right ABI is noncompressible but has good signals; the left ABI was 1.17. No formal vascular studies have been performed. On his left dorsal foot, there is a circular lesion that exposes the fat layer. There is fairly heavy slough accumulation within the wound, but no significant odor. On his right medial calf, he has multiple pinholes that have slough buildup in them and probe for about  2 to 4 mm in depth. They are draining clear fluid. On his right lateral midfoot, he has ulceration consistent with venous stasis. It is geographic and has slough accumulation. 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 02/17/2022: All of the wounds are little bit larger today and have more slough accumulation. Today, the medial calf openings are larger and probe to something that feels calcified. There is odor coming from his wounds. His vascular studies are scheduled for August 9 and 10. 02-24-2022 upon evaluation today patient presents for follow-up  concerning his ongoing issues here with his lower extremities. With that being said he does have KHYLER, ESCHMANN ([id number]) [id number] Page 9 of 15 significant problems with what appears to be cellulitis unfortunately the PCR culture that was sent last week got crushed by [employer] in transit. With that being said we are going to reobtain a culture today although I am actually to do it on the right medial leg as this seems to be the most inflamed area today where there is some questionable drainage as well. I am going to remove some of the calcium deposits which are loosening obtain a deeper culture from this area. Fortunately I do not see any evidence of active infection systemically which is good news but locally I think we are having a bigger issue here. He does have his appointment next Thursday with vascular. Patient has also been referred to [facility] for further evaluation and treatment of the calcinosis for possible sulfate injections. 03/04/2022: The culture that was performed last week grew out multiple species including Klebsiella, Morganella, and Pseudomonas aeruginosa. He had been prescribed Bactrim but this was not adequate to cover all of the species and levofloxacin was recommended. He completed the Bactrim but did not pick up the levofloxacin and begin taking it until yesterday. We referred him to dermatology at [facility] for evaluation of his calcinosis cutis and possible sodium thiosulfate application or injection, but he has not yet received an appointment. He had arterial studies performed today. He does have evidence of peripheral arterial disease. Results copied here: +-------+-----------+-----------+------------+------------+ ABI/TBIT oday's ABIT oday's TBIPrevious ABIPrevious TBI +-------+-----------+-----------+------------+------------+ Right [place] 0.75     +-------+-----------+-----------+------------+------------+ Left [place] 0.57    +-------+-----------+-----------+------------+------------+ Arterial wall calcification precludes accurate ankle pressures and ABIs. Summary: Right: Resting right ankle-brachial index indicates noncompressible right lower extremity arteries. The right toe-brachial index is normal. Left: Resting left ankle-brachial index indicates noncompressible left lower extremity arteries. The left toe-brachial index is abnormal. He continues to have further tissue breakdown at the right medial calf and there is ample slough accumulation along with necrotic subcutaneous tissue. The left dorsal foot wound has built up slough, as well. The right medial foot wound is nearly closed with just a light layer of eschar over the surface. The right dorsal lateral foot wound has also accumulated a fair amount of slough and remains quite tender. 03/10/2022: He has been taking the prescribed levofloxacin and has about 3 days left of this. The erythema and induration on his right leg has improved. He continues to experience further breakdown of the right medial calf wound. There is extensive slough and debris accumulation there. There is heavy slough on his left dorsal foot wound, but no odor or purulent drainage. The right medial foot wound appears to be closed. The right dorsal lateral foot wound also has a fair amount of slough but it is less tender than  at our last visit. He still does not have an appointment with dermatology. 03/22/2022: The right anterior tibial wound has closed. The right lateral foot wound is nearly closed with just a little bit of slough. The left dorsal foot wound is smaller and continues to have some slough buildup but less so than at prior visits. There is good granulation tissue underlying the slough. Unfortunately the right medial calf wound continues to deteriorate. It has a foul odor today and a lot of necrotic  tissue, the surrounding skin is also erythematous and moist. 03/30/2022: The right lateral foot wound continues to contract and just has a little bit of slough and eschar present. The left dorsal foot wound is also smaller with slough overlying good granulation tissue. The right medial calf wound actually looks quite a bit better today; there is no odor and there is much less necrotic tissue although some remains present. We have been using topical gentamicin and he has been taking oral Augmentin. 04/07/2022: The patient saw dermatology at [facility] last week. Unfortunately, it appears that they did not read any of the documentation that was provided. 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 Overall, it was a highly unsatisfactory consultation. In the meantime, however, we were able to identify compounding pharmacy who could create a topical sodium thiosulfate ointment. The patient has that with him today. Both of the right foot wounds are now closed. The left dorsal foot wound has contracted but still has a layer of slough on the surface. The medial calf wounds on the right all look a little bit better. They still have heavy slough along with the chunks of calcium. Everything is stained purple. 04/14/2022: The wound on his dorsal left foot is a little bit smaller again today. There is still slough accumulation on the surface. The medial calf wounds have quite a bit less slough accumulation today. His right leg, however, is warm and erythematous, concerning for cellulitis. 04/14/2022: He completed his course of Augmentin yesterday. The medial calf wound sites are much cleaner today and shallower. There is less fragmented calcium in the  wounds. He has a lot of lymphedema fluid-like drainage from these wounds, but no purulent discharge or malodor. The wound on his dorsal left foot has also contracted and is shallower. There is a layer of slough over healthy granulation tissue. 05/05/2022: The left dorsal foot wound is smaller today with less slough and more granulation tissue. The right medial calf wounds are shallower, but have extensive slough and some fat necrosis. The drainage has diminished considerably, however. 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 05/12/2022: The left dorsal foot wound continues to contract and fill with granulation tissue. There is slough on the wound surface. The right medial calf wounds also continue to fill with healthy tissue, but there is remaining slough and fat necrosis present. He had a significant increase in his drainage this week and it was a bright yellow-green color. He also had a new wound open over his right anterior tibial surface associated with the spike of cutaneous calcium. The leg is more red, as well, concerning for infection. 05/19/2022: His culture  returned positive for Pseudomonas. He is currently taking a course of oral levofloxacin. We have also been using topical gentamicin mixed in with his sodium thiosulfate. All of the wounds look better this week. 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 There is slough and nonviable subcutaneous tissue still present. The new anterior tibial wound just has a light layer of slough. The dorsal foot wound also has some slough present and appears a little dry today. 05/26/2022: The right medial leg wound continues to improve. It is feeling with granulation tissue, but still accumulates a fairly thick layer of slough on the surface. The more recent anterior tibial wound has remained quite small,  but also has some slough on the surface. The left dorsal foot wound has contracted somewhat and has perimeter epithelialization. He completed his oral levofloxacin. 06/02/2022: The left dorsal foot wound continues to improve significantly. There is just a little slough on the wound surface. The right medial leg wound had black drainage, but it looks like silver alginate was applied last week and I theorized that silver sulfide was created with a combination of the silver alginate and the sodium thiosulfate. It did, however, have a bit of an odor to it and extensive slough accumulation. The small anterior tibial wound also had a bit of slough and nonviable subcutaneous tissue present. The skin of his leg was rather macerated, as well. 06/09/2022: The left dorsal foot wound is smaller again this week with just a light layer of slough on the surface. The right medial leg wound looks substantially better. The leg and periwound skin are no longer red and macerated. There is good granulation tissue forming with less slough and nonviable tissue. The anterior tibial wound just has a small amount of slough accumulation. TAEVON, ASCHOFF ([id number]) [id number] Page 10 of 15 06/16/2022: The left dorsal foot wound continues to contract. His wrap slipped a little bit, however, and his foot is more swollen. The right medial leg wound continues to improve. The underlying tissue is more vital-appearing and there is less slough. He does have substantial drainage. The small anterior tibial wound has a bit of slough accumulation. 06/23/2022: The left dorsal foot wound is about half the size as it was last week. There is just some light slough on the surface and some eschar buildup around the edges. The right anterior leg wound is small with a little slough on the surface. Unfortunately, the right medial leg wound deteriorated fairly substantially. It is malodorous with gray/green/black  discoloration. 06/30/2022: The left dorsal foot wound continues to contract. It is nearly closed with just a light layer of slough present. The right anterior leg wound is about the same size and has a small amount of slough on the surface. The right medial leg wound looks a lot better than it did last week. The odor has abated and the tissue is less pulpy and necrotic-looking. There is still a fairly thick layer of slough present. Patient History Information obtained from Patient, Chart. Family History Cancer - Mother, Heart Disease - Father,Mother, Hypertension - Father,Siblings, Lung Disease - Siblings, No family history of Diabetes, Hereditary Spherocytosis, Kidney Disease, Seizures, Stroke, Thyroid Problems, Tuberculosis. Social History Former smoker - quit 1991, Marital Status - Married, Alcohol Use - Moderate, Drug Use - No History, Caffeine Use - Daily - coffee. Medical History Eyes Patient has history of Cataracts - right eye removed Denies history of Glaucoma, Optic Neuritis Ear/Nose/Mouth/Throat Denies history of Chronic sinus problems/congestion, Middle ear  problems Hematologic/Lymphatic Patient has history of Anemia - macrocytic, Lymphedema Cardiovascular Patient has history of Congestive Heart Failure, Deep Vein Thrombosis - left leg, Hypertension, Peripheral Venous Disease Endocrine Denies history of Type I Diabetes, Type II Diabetes Genitourinary Denies history of End Stage Renal Disease Integumentary (Skin) Denies history of History of Burn Oncologic Denies history of Received Chemotherapy, Received Radiation Psychiatric Denies history of Anorexia/bulimia, Confinement Anxiety Hospitalization/Surgery History - cervical fusion. - lumbar surgery. - coronary stent placement. - lasik eye surgery. - hydrocele excision. Medical A Surgical History Notes nd Constitutional Symptoms (General Health) obesity Ear/Nose/Mouth/Throat hard of hearing Respiratory frozen diaphragm  right Cardiovascular hyperlipidemia Musculoskeletal myositis, lumbar DDD, spinal stenosis, cervical facet joint syndrome Objective Constitutional He is hypertensive, but asymptomatic.Marland Kitchen No acute distress. Vitals Time Taken: 12:40 PM, Height: 71 in, Weight: 267 lbs, BMI: 37.2, Temperature: 97.8 F, Pulse: 61 bpm, Respiratory Rate: 18 breaths/min, Blood Pressure: 172/80 mmHg. Respiratory Normal work of breathing on room air. General Notes: 06/30/2022: The left dorsal foot wound continues to contract. It is nearly closed with just a light layer of slough present. The right anterior leg wound is about the same size and has a small amount of slough on the surface. The right medial leg wound looks a lot better than it did last week. The odor has abated and the tissue is less pulpy and necrotic-looking. There is still a fairly thick layer of slough present. Integumentary (Hair, Skin) Wound #1 status is Open. Original cause of wound was Gradually Appeared. The date acquired was: 12/07/2021. The wound has been in treatment 20 weeks. The wound is located on the Left,Dorsal Foot. The wound measures 0.9cm length x 0.7cm width x 0.1cm depth; 0.495cm^2 area and 0.049cm^3 volume. There is Fat Layer (Subcutaneous Tissue) exposed. There is no tunneling or undermining noted. There is a medium amount of serosanguineous drainage noted. The wound margin is distinct with the outline attached to the wound base. There is small (1-33%) red, hyper - granulation within the wound bed. There is a large ([address], [place] ([id number]) [id number] Page 11 of 15 100%) amount of necrotic tissue within the wound bed including Adherent Slough. The periwound skin appearance had no abnormalities noted for texture. The periwound skin appearance had no abnormalities noted for moisture. The periwound skin appearance had no abnormalities noted for color. Periwound temperature was noted as No Abnormality. The  periwound has tenderness on palpation. Wound #3 status is Open. Original cause of wound was Gradually Appeared. The date acquired was: 12/07/2021. The wound has been in treatment 20 weeks. The wound is located on the Right,Medial Lower Leg. The wound measures 3.5cm length x 4.8cm width x 0.9cm depth; 13.195cm^2 area and 11.875cm^3 volume. There is Fat Layer (Subcutaneous Tissue) exposed. There is no tunneling or undermining noted. There is a large amount of serosanguineous drainage noted. Foul odor after cleansing was noted. The wound margin is distinct with the outline attached to the wound base. There is medium (34-66%) red, hyper - granulation within the wound bed. There is a medium (34-66%) amount of necrotic tissue within the wound bed including Adherent Slough. The periwound skin appearance had no abnormalities noted for texture. The periwound skin appearance had no abnormalities noted for moisture. The periwound skin appearance exhibited: Hemosiderin Staining. The periwound skin appearance did not exhibit: Erythema. Periwound temperature was noted as No Abnormality. The periwound has tenderness on palpation. Wound #6 status is Open. Original cause of wound was Gradually Appeared. The date acquired was: 05/12/2022. The wound  has been in treatment 7 weeks. The wound is located on the Right,Anterior Lower Leg. The wound measures 0.5cm length x 0.3cm width x 0.2cm depth; 0.118cm^2 area and 0.024cm^3 volume. There is Fat Layer (Subcutaneous Tissue) exposed. There is no tunneling or undermining noted. There is a medium amount of serosanguineous drainage noted. The wound margin is epibole. There is medium (34-66%) red granulation within the wound bed. There is a medium (34-66%) amount of necrotic tissue within the wound bed including Adherent Slough. The periwound skin appearance had no abnormalities noted for texture. The periwound skin appearance had no abnormalities noted for moisture. The periwound  skin appearance exhibited: Hemosiderin Staining. Periwound temperature was noted as No Abnormality. The periwound has tenderness on palpation. 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 There was a Selective/Open Wound Non-Viable Tissue Debridement with a total area of 0.63 sq cm performed by Fredirick Maudlin, MD. With the following instrument(s): Curette to remove Non-Viable tissue/material. Material removed includes Eschar and Slough and after achieving pain control using Lidocaine 4% T opical Solution. No specimens were taken. A time out was conducted at 13:05, prior to the start of the procedure. A Minimum amount of bleeding was controlled with Pressure. The procedure was tolerated well with a pain level of 0 throughout and a pain level of 0 following the procedure. Post Debridement Measurements: 0.9cm length x 0.7cm width x 0.1cm depth; 0.049cm^3 volume. Character of Wound/Ulcer Post Debridement is improved. Post procedure Diagnosis Wound #1: Same as Pre-Procedure General Notes: scribed for Dr [name] by Sharyn Creamer, Rn. Wound #3 Pre-procedure diagnosis of Wound #3 is a Lymphedema located on the Right,Medial Lower Leg . There was a Excisional Skin/Subcutaneous Tissue Debridement with a total area of 16.8 sq cm performed by Fredirick Maudlin, MD. With the following instrument(s): Curette to remove Non-Viable tissue/material. Material removed includes Subcutaneous Tissue  and Slough and after achieving pain control using Lidocaine 4% T opical Solution. No specimens were taken. A time out was conducted at 13:05, prior to the start of the procedure. A Minimum amount of bleeding was controlled with Pressure. The procedure was tolerated well with a pain level of 0 throughout and a pain level of 0 following the procedure. Post Debridement Measurements: 3.5cm length x 4.8cm width x 0.9cm depth; 11.875cm^3 volume. Character of Wound/Ulcer Post Debridement is improved. Post procedure Diagnosis Wound #3: Same as Pre-Procedure General Notes: scribed for Dr [name] by Sharyn Creamer, Rn. Wound #6 Pre-procedure diagnosis of Wound #6 is a Calcinosis located on the Right,Anterior Lower Leg . There was a Selective/Open Wound Non-Viable Tissue Debridement with a total area of 0.15 sq cm performed by Fredirick Maudlin, MD. With the following instrument(s): Curette to remove Non-Viable tissue/material. Material removed includes [facility] after achieving pain control using Lidocaine 4% Topical Solution. No specimens were taken. A time out was conducted at 13:05, prior to the start of the procedure. A Minimum amount of bleeding was controlled with Pressure. The procedure was tolerated well with a pain level of 0 throughout and a pain level of 0 following the procedure. Post Debridement Measurements:  0.5cm length x 0.3cm width x 0.2cm depth; 0.024cm^3 volume. Character of Wound/Ulcer Post Debridement is improved. Post procedure Diagnosis Wound #6: Same as Pre-Procedure General Notes: scribed for Dr [name] by Sharyn Creamer, Rn. Plan Follow-up Appointments: Return Appointment in 1 week. - Dr. [name] Rm 1 Anesthetic: Wound #1 Left,Dorsal Foot: NEKO, BOYAJIAN ([id number]) [id number] Page 12 of 15 (In clinic) Topical Lidocaine 4% applied to wound bed Wound #3 Right,Medial Lower Leg: (In clinic) Topical Lidocaine 4% applied to wound bed Wound #6 Right,Anterior  Lower Leg: (In clinic) Topical Lidocaine 4% applied to wound bed Bathing/ Shower/ Hygiene: May shower with protection but do not get wound dressing(s) wet. Edema Control - Lymphedema / SCD / Other: Lymphedema Pumps. Use Lymphedema pumps on leg(s) 2-3 times a day for 45-60 minutes. If wearing any wraps or hose, do not remove them. Continue exercising as instructed. - use 1-2 times per day over wraps Elevate legs to the level of the heart or above for 30 minutes daily and/or when sitting, a frequency of: Exercise regularly The following medication(s) was prescribed: lidocaine topical 4 % cream cream topical once daily for prior to debridement was prescribed at facility WOUND #1: - Foot Wound Laterality: Dorsal, Left Peri-Wound Care: Sween Lotion (Moisturizing lotion) 1 x Per Week/ Discharge Instructions: Apply moisturizing lotion as directed Prim Dressing: KerraCel Ag Gelling Fiber Dressing, 4x5 in (silver alginate) 1 x Per Week/ ary Discharge Instructions: Apply silver alginate to wound bed as instructed Secondary Dressing: Woven Gauze Sponge, Non-Sterile 4x4 in 1 x Per Week/ Discharge Instructions: Apply over primary dressing as directed. Com pression Wrap: ThreePress (3 layer compression wrap) 1 x Per Week/ Discharge Instructions: Apply three layer compression as directed. WOUND #3: - Lower Leg Wound Laterality: Right, Medial Peri-Wound Care: Sween Lotion (Moisturizing lotion) 1 x Per Week/ Discharge Instructions: Apply moisturizing lotion as directed Topical: Gentamicin 1 x Per Week/ Discharge Instructions: As directed by physician Prim Dressing: Santyl Ointment 1 x Per Week/ ary Discharge Instructions: Apply nickel thick amount to wound bed as instructed Prim Dressing: KerraCel Ag Gelling Fiber Dressing, 4x5 in (silver alginate) 1 x Per Week/ ary Discharge Instructions: Apply silver alginate to wound bed as instructed Prim Dressing: Medline Woven Gauze Sponges 4x4 (in/in) 1 x Per  Week/ ary Discharge Instructions: pack into wound Secondary Dressing: ABD Pad, 8x10 1 x Per Week/ Discharge Instructions: Apply over primary dressing as directed. Secondary Dressing: Woven Gauze Sponge, Non-Sterile 4x4 in 1 x Per Week/ Discharge Instructions: Apply over primary dressing as directed. Secondary Dressing: Zetuvit Plus 4x8 in 1 x Per Week/ Discharge Instructions: Apply over primary dressing as directed. Com pression Wrap: ThreePress (3 layer compression wrap) 1 x Per Week/ Discharge Instructions: Apply three layer compression as directed. WOUND #6: - Lower Leg Wound Laterality: Right, Anterior Peri-Wound Care: Sween Lotion (Moisturizing lotion) 1 x Per Week/30 Days Discharge Instructions: Apply moisturizing lotion as directed Topical: Gentamicin 1 x Per Week/30 Days Discharge Instructions: As directed by physician Prim Dressing: Santyl Ointment 1 x Per Week/30 Days ary Discharge Instructions: Apply nickel thick amount to wound bed as instructed Prim Dressing: KerraCel Ag Gelling Fiber Dressing, 4x5 in (silver alginate) 1 x Per Week/30 Days ary Discharge Instructions: Apply silver alginate to wound bed as instructed Prim Dressing: Medline Woven Gauze Sponges 4x4 (in/in) 1 x Per Week/30 Days ary Discharge Instructions: pack into wound Secondary Dressing: ABD Pad, 8x10 1 x Per Week/30 Days Discharge Instructions: Apply over primary dressing as directed. Secondary Dressing: Woven Gauze Sponge,  Non-Sterile 4x4 in 1 x Per Week/30 Days Discharge Instructions: Apply over primary dressing as directed. Secondary Dressing: Zetuvit Plus 4x8 in 1 x Per Week/30 Days Discharge Instructions: Apply over primary dressing as directed. Com pression Wrap: ThreePress (3 layer compression wrap) 1 x Per Week/30 Days Discharge Instructions: Apply three layer compression as directed. 06/30/2022: The left dorsal foot wound continues to contract. It is nearly closed with just a light layer of slough  present. The right anterior leg wound is about the same size and has a small amount of slough on the surface. The right medial leg wound looks a lot better than it did last week. The odor has abated and the tissue is less pulpy and necrotic-looking. There is still a fairly thick layer of slough present. I used a curette to debride slough from the left dorsal foot wound and the right anterior leg wound. I debrided slough and nonviable subcutaneous tissue from the right medial leg wound. I am going to continue the mixture of gentamicin and Santyl to the right medial leg wound. Continue silver alginate to the left dorsal foot and right anterior leg wounds. Continue bilateral 3 layer compression. Follow-up in 1 week. Electronic Signature(s) Signed: 06/30/2022 1:33:31 PM By: Fredirick Maudlin MD FACS Entered By: Fredirick Maudlin on 06/30/2022 13:33:31 [name] ([id number]) [id number] Page 13 of 15 -------------------------------------------------------------------------------- HxROS Details Patient Name: Date of Service: DAQUANE, AGUILAR 06/30/2022 12:30 PM Medical Record Number: [id number] Patient Account Number: [id number] Date of Birth/Sex: Treating RN: April 28, 1951 (71 y.o. M) Primary Care Provider: PA [name], NO Other Clinician: Referring Provider: Treating Provider/Extender: [name] in Treatment: 20 Information Obtained From Patient Chart Constitutional Symptoms (General Health) Medical History: Past Medical History Notes: obesity Eyes Medical History: Positive for: Cataracts - right eye removed Negative for: Glaucoma; Optic Neuritis Ear/Nose/Mouth/Throat Medical History: Negative for: Chronic sinus problems/congestion; Middle ear problems Past Medical History Notes: hard of hearing Hematologic/Lymphatic Medical History: Positive for: Anemia - macrocytic; Lymphedema Respiratory Medical History: Past Medical History  Notes: frozen diaphragm right Cardiovascular Medical History: Positive for: Congestive Heart Failure; Deep Vein Thrombosis - left leg; Hypertension; Peripheral Venous Disease Past Medical History Notes: hyperlipidemia Endocrine Medical History: Negative for: Type I Diabetes; Type II Diabetes Genitourinary Medical History: Negative for: End Stage Renal Disease Integumentary (Skin) Medical History: Negative for: History of Burn Musculoskeletal Medical History: Past Medical History Notes: myositis, lumbar DDD, spinal stenosis, cervical facet joint syndrome Oncologic Medical HistoryZAMERE, PASTERNAK ([id number]) [id number] Page 14 of 15 Negative for: Received Chemotherapy; Received Radiation Psychiatric Medical History: Negative for: Anorexia/bulimia; Confinement Anxiety HBO Extended History Items Eyes: Cataracts Immunizations Pneumococcal Vaccine: Received Pneumococcal Vaccination: Yes Received Pneumococcal Vaccination On or After 60th Birthday: Yes Implantable Devices No devices added Hospitalization / Surgery History Type of Hospitalization/Surgery cervical fusion lumbar surgery coronary stent placement lasik eye surgery hydrocele excision Family and Social History Cancer: Yes - Mother; Diabetes: No; Heart Disease: Yes - Father,Mother; Hereditary Spherocytosis: No; Hypertension: Yes - Father,Siblings; Kidney Disease: No; Lung Disease: Yes - Siblings; Seizures: No; Stroke: No; Thyroid Problems: No; Tuberculosis: No; Former smoker - quit 1991; Marital Status - Married; Alcohol Use: Moderate; Drug Use: No History; Caffeine Use: Daily - coffee; Financial Concerns: No; Food, Clothing or Shelter Needs: No; Support System Lacking: No; Transportation Concerns: No Electronic Signature(s) Signed: 06/30/2022 5:07:18 PM By: Fredirick Maudlin MD FACS Entered By: Fredirick Maudlin on 06/30/2022  13:31:12 -------------------------------------------------------------------------------- SuperBill Details Patient Name: Date of Service: Balsley, [name] 06/30/2022 Medical Record Number:  [id number] Patient Account Number: [id number] Date of Birth/Sex: Treating RN: 10-May-1951 (71 y.o. 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 Quantity: 1 pdf Page 15 of 15 : 76 CPT4 Code: 212248 250 IC L L Description: 58 - DEBRIDE WOUND 1ST 20 SQ CM OR < D-10 Diagnosis Description 97.812 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d 97.522 Non-pressure chronic ulcer of other part of left foot with fat layer exposed Modifier: 1 Quantity: Physician Procedures : CPT4 Code Description Modifier [id number] 99214 - WC PHYS LEVEL 4 - EST PT 25 ICD-10 Diagnosis Description 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d L97.522 Non-pressure chronic ulcer of other part of left  foot with fat layer  exposed 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.0 Lymphedema, not elsewhere classified Quantity: 1 : [id number] 11042 - WC PHYS SUBQ TISS 20 SQ CM ICD-10 Diagnosis Description W38.882 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

## 2022-07-07 ENCOUNTER — Encounter (HOSPITAL_BASED_OUTPATIENT_CLINIC_OR_DEPARTMENT_OTHER): Payer: Medicare Other | Admitting: Internal Medicine

## 2022-07-07 DIAGNOSIS — I890 Lymphedema, not elsewhere classified: Secondary | ICD-10-CM | POA: Diagnosis not present

## 2022-07-07 DIAGNOSIS — L942 Calcinosis cutis: Secondary | ICD-10-CM | POA: Diagnosis not present

## 2022-07-07 DIAGNOSIS — Z86718 Personal history of other venous thrombosis and embolism: Secondary | ICD-10-CM | POA: Diagnosis not present

## 2022-07-07 DIAGNOSIS — L97812 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d: Secondary | ICD-10-CM | POA: Diagnosis not present

## 2022-07-07 DIAGNOSIS — I872 Venous insufficiency (chronic) (peripheral): Secondary | ICD-10-CM | POA: Diagnosis not present

## 2022-07-07 DIAGNOSIS — L97522 Non-pressure chronic ulcer of other part of left foot with fat layer exposed: Secondary | ICD-10-CM | POA: Diagnosis not present

## 2022-07-08 NOTE — Progress Notes (Signed)
GIBBS, NAUGLE ([id number]) [id number] Page 1 of 13 Visit Report for 07/07/2022 Arrival Information Details Patient Name: Date of Service: [name] 07/07/2022 12:30 PM Medical Record Number: [id number] Patient Account Number: [id number] Date of Birth/Sex: Treating RN: [birth year] (71 y.o. [name] Primary Care Shane Melby: PA [name], NO Other Clinician: Referring Roberta Angell: Treating Glenard Keesling/Extender: Anselm Pancoast in Treatment: 21 Visit Information History Since Last Visit Added or deleted any medications: No Patient Arrived: Wheel Chair Any new allergies or adverse reactions: No Arrival Time: 12:45 Had a fall or experienced change in No Accompanied By: self activities of daily living that may affect Transfer Assistance: None risk of falls: Patient Identification Verified: Yes Signs or symptoms of abuse/neglect since last visito No Secondary Verification Process Completed: Yes Hospitalized since last visit: No Patient Requires Transmission-Based Precautions: No Implantable device outside of the clinic excluding No Patient Has Alerts: Yes cellular tissue based products placed in the center Patient Alerts: R ABI= N/C, TBI= .75 since last visit: L ABI =N/C, TBI = .57 Has Dressing in Place as Prescribed: Yes Has Compression in Place as Prescribed: Yes Pain Present Now: No Electronic Signature(s) Signed: 07/07/2022 5:46:55 PM By: Deon Pilling RN, BSN Entered By: Deon Pilling on 07/07/2022 12:50:16 -------------------------------------------------------------------------------- Compression Therapy Details Patient Name: Date of Service: [name], [name] 07/07/2022 12:30 PM Medical Record Number: [id number] Patient Account Number: [id number] Date of Birth/Sex: Treating RN: 02/14/1951 (71 y.o. [name] Primary Care Jaidev Sanger: PA Darnelle Spangle Other Clinician: Referring Charolette Bultman: Treating Jaevian Shean/Extender: Anselm Pancoast in Treatment: 21 Compression Therapy Performed for Wound Assessment: Wound #3 Right,Medial Lower Leg Performed By: Clinician Deon Pilling, RN Compression Type: Three Layer Post Procedure Diagnosis Same as Pre-procedure Electronic Signature(s) Signed: 07/07/2022 5:46:55 PM By: Deon Pilling RN, BSN Entered By: Deon Pilling on 07/07/2022 13:09:58 [name] ([id number]) [id number] Page 2 of 13 -------------------------------------------------------------------------------- Compression Therapy Details Patient Name: Date of Service: [name] 07/07/2022 12:30 PM Medical Record Number: [id number] Patient Account Number: [id number] Date of Birth/Sex: Treating RN: 03-25-1951 (71 y.o. [name] Primary Care Ethlyn Alto: PA Darnelle Spangle Other Clinician: Referring Kentrail Shew: Treating Ron Beske/Extender: Anselm Pancoast in Treatment: 21 Compression Therapy Performed for Wound Assessment: Wound #6 Right,Anterior Lower Leg Performed By: Clinician Deon Pilling, RN Compression Type: Three Layer Post Procedure Diagnosis Same as Pre-procedure Electronic Signature(s) Signed: 07/07/2022 5:46:55 PM By: Deon Pilling RN, BSN Entered By: Deon Pilling on 07/07/2022 13:09:58 -------------------------------------------------------------------------------- Compression Therapy Details Patient Name: Date of Service: [name] 07/07/2022 12:30 PM Medical Record Number: [id number] Patient Account Number: [id number] Date of Birth/Sex: Treating RN: March 31, 1951 (71 y.o. [name] Primary Care Jens Siems: PA Darnelle Spangle Other Clinician: Referring Oneka Parada: Treating Kenji Mapel/Extender: Anselm Pancoast in Treatment: 21 Compression Therapy Performed for Wound Assessment: Wound #1 Left,Dorsal Foot Performed By: Clinician Deon Pilling, RN Compression Type: Three Layer Post Procedure Diagnosis Same as  Pre-procedure Electronic Signature(s) Signed: 07/07/2022 5:46:55 PM By: Deon Pilling RN, BSN Entered By: Deon Pilling on 07/07/2022 13:09:58 -------------------------------------------------------------------------------- Encounter Discharge Information Details Patient Name: Date of Service: [name] 07/07/2022 12:30 PM Medical Record Number: [id number] Patient Account Number: [id number] Date of Birth/Sex: Treating RN: 02-17-1951 (71 y.o. [name] Primary Care Caysie Minnifield: PA [name], NO Other Clinician: Referring Adalis Gatti: Treating Calene Paradiso/Extender: Anselm Pancoast in Treatment: 21 Encounter Discharge Information Items Post Procedure Vitals Discharge Condition: Stable Temperature (F): 98.1 Ambulatory Status: Wheelchair Pulse (bpm): 58 Discharge Destination: Home Respiratory Rate (  breaths/min): 20 Transportation: Private Auto Blood Pressure (mmHg): 163/79 Accompanied By: self Schedule Follow-up Appointment: Yes Clinical Summary of Care: [name] ([id number]) [id number] Page 3 of 13 Electronic Signature(s) Signed: 07/07/2022 5:46:55 PM By: Deon Pilling RN, BSN Entered By: Deon Pilling on 07/07/2022 13:17:45 -------------------------------------------------------------------------------- Lower Extremity Assessment Details Patient Name: Date of Service: [name], [name] 07/07/2022 12:30 PM Medical Record Number: [id number] Patient Account Number: [id number] Date of Birth/Sex: Treating RN: Apr 15, 1951 (71 y.o. [name] Primary Care Hend Mccarrell: PA [name], NO Other Clinician: Referring Jacquese Cassarino: Treating Marella Vanderpol/Extender: Anselm Pancoast in Treatment: 21 Edema Assessment Assessed: [Left: Yes] [Right: Yes] Edema: [Left: Yes] [Right: Yes] Calf Left: Right: Point of Measurement: From Medial Instep 42 cm 44 cm Ankle Left: Right: Point of Measurement: From Medial Instep 24 cm 22 cm Vascular  Assessment Pulses: Dorsalis Pedis Palpable: [Left:Yes] [Right:Yes] Electronic Signature(s) Signed: 07/07/2022 5:46:55 PM By: Deon Pilling RN, BSN Entered By: Deon Pilling on 07/07/2022 12:51:04 -------------------------------------------------------------------------------- Multi Wound Chart Details Patient Name: Date of Service: [name] 07/07/2022 12:30 PM Medical Record Number: [id number] Patient Account Number: [id number] Date of Birth/Sex: Treating RN: Nov 03, 1950 (71 y.o. M) Primary Care Brinley Treanor: PA [name], NO Other Clinician: Referring Isidoro Santillana: Treating Tanyiah Laurich/Extender: Bedelia Person, [name] in Treatment: 21 Vital Signs Height(in): 71 Pulse(bpm): 58 Weight(lbs): 267 Blood Pressure(mmHg): 163/79 Body Mass Index(BMI): 37.2 Temperature(F): 99.1 Respiratory Rate(breaths/min): 18 [1:Photos:] [[id number] Page 4 of 13] Left, Dorsal Foot Right, Medial Lower Leg Right, Anterior Lower Leg Wound Location: Gradually Appeared Gradually Appeared Gradually Appeared Wounding Event: Lymphedema Lymphedema Calcinosis Primary Etiology: Cataracts, Anemia, Lymphedema, Cataracts, Anemia, Lymphedema, Cataracts, Anemia, Lymphedema, Comorbid History: Congestive Heart Failure, Deep Vein Congestive Heart Failure, Deep Vein Congestive Heart Failure, Deep Vein Thrombosis, Hypertension, Peripheral Thrombosis, Hypertension, PeripheralThrombosis, Hypertension, Peripheral Venous Disease Venous Disease Venous Disease 12/07/2021 12/07/2021 05/12/2022 Date Acquired: '21 21 8 '$ Weeks of Treatment: Open Open Open Wound Status: No No No Wound Recurrence: 0.6x0.5x0.1 3.5x4.5x0.6 0.5x0.3x0.3 Measurements L x [name] x D (cm) 0.236 12.37 0.118 A (cm) : rea 0.024 7.422 0.035 Volume (cm) : 93.80% -131.60% 50.00% % Reduction in A rea: 98.40% -247.50% -45.80% % Reduction in Volume: 1 Position 1 (o'clock): 0.3 Maximum Distance 1 (cm): No No  Yes Tunneling: Full Thickness Without Exposed Full Thickness With Exposed Support Full Thickness Without Exposed Classification: Support Structures Structures Support Structures Medium Large Medium Exudate Amount: Serosanguineous Serosanguineous Serosanguineous Exudate Type: red, brown red, brown red, brown Exudate Color: No Yes No Foul Odor A Cleansing: fter N/A No N/A Odor Anticipated Due to Product Use: Distinct, outline attached Distinct, outline attached Epibole Wound Margin: Large (67-100%) Medium (34-66%) Large (67-100%) Granulation A mount: Red Red, Hyper-granulation Red Granulation Quality: None Present (0%) Medium (34-66%) Small (1-33%) Necrotic Amount: Fat Layer (Subcutaneous Tissue): Yes Fat Layer (Subcutaneous Tissue): Yes Fat Layer (Subcutaneous Tissue): Yes Exposed Structures: Fascia: No Fascia: No Fascia: No Tendon: No Tendon: No Tendon: No Muscle: No Muscle: No Muscle: No Joint: No Joint: No Joint: No Bone: No Bone: No Bone: No Large (67-100%) Small (1-33%) None Epithelialization: N/A Debridement - Excisional N/A Debridement: Pre-procedure Verification/Time Out N/A 13:05 N/A Taken: N/A Lidocaine 4% Topical Solution N/A Pain Control: N/A Subcutaneous, Slough N/A Tissue Debrided: N/A Skin/Subcutaneous Tissue N/A Level: N/A 15.75 N/A Debridement A (sq cm): rea N/A Curette N/A Instrument: N/A Minimum N/A Bleeding: N/A Pressure N/A Hemostasis A chieved: N/A 0 N/A Procedural Pain: N/A 0 N/A Post Procedural Pain: N/A Procedure was tolerated well N/A Debridement Treatment Response: N/A 3.5x4.5x0.6  N/A Post Debridement Measurements L x [name] x D (cm) N/A 7.422 N/A Post Debridement Volume: (cm) Excoriation: No Excoriation: Yes Excoriation: No Periwound Skin Texture: Induration: No Induration: No Callus: No Callus: No Crepitus: No Crepitus: No Rash: No Rash: No Scarring: No Scarring: No Maceration: No No Abnormalities  Noted Maceration: No Periwound Skin Moisture: Dry/Scaly: No Dry/Scaly: No Hemosiderin Staining: Yes Hemosiderin Staining: Yes Hemosiderin Staining: Yes Periwound Skin Color: Atrophie Blanche: No Erythema: No Atrophie Blanche: No Cyanosis: No Cyanosis: No Ecchymosis: No Ecchymosis: No Erythema: No Erythema: No Mottled: No Mottled: No Pallor: No Pallor: No Rubor: No Rubor: No No Abnormality No Abnormality No Abnormality Temperature: Yes Yes Yes Tenderness on Palpation: Compression Therapy Compression Therapy Compression Therapy Procedures Performed: Debridement Treatment Notes Wound #1 (Foot) Wound Laterality: Dorsal, Left ANIKIN, PROSSER ([id number]) [id number] Page 5 of 13 Cleanser Peri-Wound Care Sween Lotion (Moisturizing lotion) Discharge Instruction: Apply moisturizing lotion as directed Topical Primary Dressing KerraCel Ag Gelling Fiber Dressing, 4x5 in (silver alginate) Discharge Instruction: Apply silver alginate to wound bed as instructed Secondary Dressing Woven Gauze Sponge, Non-Sterile 4x4 in Discharge Instruction: Apply over primary dressing as directed. Secured With Compression Wrap ThreePress (3 layer compression wrap) Discharge Instruction: Apply three layer compression as directed. Compression Stockings Add-Ons Wound #3 (Lower Leg) Wound Laterality: Right, Medial Cleanser Peri-Wound Care Sween Lotion (Moisturizing lotion) Discharge Instruction: Apply moisturizing lotion as directed Topical Primary Dressing IODOFLEX 0.9% Cadexomer Iodine Pad 4x6 cm Discharge Instruction: Apply to wound bed as instructed Secondary Dressing ABD Pad, 8x10 Discharge Instruction: Apply over primary dressing as directed. Woven Gauze Sponge, Non-Sterile 4x4 in Discharge Instruction: Apply over primary dressing as directed. Zetuvit Plus 4x8 in Discharge Instruction: Apply over primary dressing as directed. Secured With Compression  Wrap ThreePress (3 layer compression wrap) Discharge Instruction: Apply three layer compression as directed. Compression Stockings Add-Ons Wound #6 (Lower Leg) Wound Laterality: Right, Anterior Cleanser Peri-Wound Care Sween Lotion (Moisturizing lotion) Discharge Instruction: Apply moisturizing lotion as directed Topical Primary Dressing Promogran Prisma Matrix, 4.34 (sq in) (silver collagen) Discharge Instruction: Moisten collagen with saline or hydrogel Secondary Dressing ABD Pad, 8x10 Discharge Instruction: Apply over primary dressing as directed. Woven Gauze Sponge, Non-Sterile 4x4 in Keddie, [name] ([id number]) [id number] Page 6 of 13 Discharge Instruction: Apply over primary dressing as directed. Zetuvit Plus 4x8 in Discharge Instruction: Apply over primary dressing as directed. Secured With Compression Wrap ThreePress (3 layer compression wrap) Discharge Instruction: Apply three layer compression as directed. Compression Stockings Add-Ons Electronic Signature(s) Signed: 07/07/2022 5:47:10 PM By: Linton Ham MD Entered By: Linton Ham on 07/07/2022 13:21:52 -------------------------------------------------------------------------------- Multi-Disciplinary Care Plan Details Patient Name: Date of Service: TALLAN, SANDOZ 07/07/2022 12:30 PM Medical Record Number: [id number] Patient Account Number: [id number] Date of Birth/Sex: Treating RN: 16-Mar-1951 (71 y.o. [name] Primary Care Kekoa Fyock: PA [name], NO Other Clinician: Referring Emmalise Huard: Treating Chelsae Zanella/Extender: Anselm Pancoast in Treatment: 21 Multidisciplinary Care Plan reviewed with physician Active Inactive Venous Leg Ulcer Nursing Diagnoses: Knowledge deficit related to disease process and management Potential for venous Insuffiency (use before diagnosis confirmed) Goals: Patient will maintain optimal edema control Date Initiated: 02/17/2022 Target  Resolution Date: 08/04/2022 Goal Status: Active Interventions: Assess peripheral edema status every visit. Compression as ordered Provide education on venous insufficiency Treatment Activities: Non-invasive vascular studies : 02/17/2022 Therapeutic compression applied : 02/17/2022 Notes: Wound/Skin Impairment Nursing Diagnoses: Impaired tissue integrity Knowledge deficit related to ulceration/compromised skin integrity Goals: Patient/caregiver will verbalize understanding of skin care regimen Date Initiated: 02/17/2022 Target Resolution Date:  08/04/2022 Goal Status: Active Ulcer/skin breakdown will have a volume reduction of 30% by week 4 Date Initiated: 02/17/2022 Date Inactivated: 03/10/2022 Target Resolution Date: 03/10/2022 [name], [name] ([id number]) [id number] Page 7 of 13 Unmet Reason: infection being treated, Goal Status: Unmet waiting on derm appte Ulcer/skin breakdown will have a volume reduction of 50% by week 8 Date Initiated: 03/10/2022 Date Inactivated: 04/14/2022 Target Resolution Date: 04/07/2022 Goal Status: Unmet Unmet Reason: calcinosis Ulcer/skin breakdown will have a volume reduction of 80% by week 12 Date Initiated: 04/14/2022 Date Inactivated: 05/05/2022 Target Resolution Date: 05/05/2022 Unmet Reason: infection, chronic Goal Status: Unmet condition Interventions: Assess patient/caregiver ability to obtain necessary supplies Assess patient/caregiver ability to perform ulcer/skin care regimen upon admission and as needed Assess ulceration(s) every visit Provide education on ulcer and skin care Treatment Activities: Skin care regimen initiated : 02/17/2022 Topical wound management initiated : 02/17/2022 Notes: Electronic Signature(s) Signed: 07/07/2022 5:46:55 PM By: Deon Pilling RN, BSN Entered By: Deon Pilling on 07/07/2022 12:55:23 -------------------------------------------------------------------------------- Pain Assessment  Details Patient Name: Date of Service: [name], [name] 07/07/2022 12:30 PM Medical Record Number: [id number] Patient Account Number: [id number] Date of Birth/Sex: Treating RN: [birth year] (71 y.o. [name] Primary Care Garrison Michie: PA [name], NO Other Clinician: Referring Ponciano Shealy: Treating Shya Kovatch/Extender: Anselm Pancoast in Treatment: 21 Active Problems Location of Pain Severity and Description of Pain Patient Has Paino No Site Locations Rate the pain. Current Pain Level: 0 Pain Management and Medication Current Pain Management: Medication: No Cold Application: No Rest: No Massage: No Activity: No T.E.N.S.: No Heat Application: No Leg drop or elevation: No Is the Current Pain Management Adequate: Adequate How does your wound impact your activities of daily KAAN, TOSH ([id number]) [id number] Page 8 of 13 Sleep: No Bathing: No Appetite: No Relationship With Others: No Bladder Continence: No Emotions: No Bowel Continence: No Work: No Toileting: No Drive: No Dressing: No Hobbies: No Engineer, maintenance) Signed: 07/07/2022 5:46:55 PM By: Deon Pilling RN, BSN Entered By: Deon Pilling on 07/07/2022 12:50:37 -------------------------------------------------------------------------------- Patient/Caregiver Education Details Patient Name: Date of Service: [name], [name] 12/13/2023andnbsp12:30 PM Medical Record Number: [id number] Patient Account Number: [id number] Date of Birth/Gender: Treating RN: 06-01-1951 (71 y.o. [name] Primary Care Physician: PA [name], [place] Other Clinician: Referring Physician: Treating Physician/Extender: Anselm Pancoast in Treatment: 21 Education Assessment Education Provided To: Patient Education Topics Provided Wound/Skin Impairment: Handouts: Skin Care Do's and Dont's Methods: Explain/Verbal Responses: Reinforcements needed Electronic  Signature(s) Signed: 07/07/2022 5:46:55 PM By: Deon Pilling RN, BSN Entered By: Deon Pilling on 07/07/2022 12:55:35 -------------------------------------------------------------------------------- Wound Assessment Details Patient Name: Date of Service: [name], [name] 07/07/2022 12:30 PM Medical Record Number: [id number] Patient Account Number: [id number] Date of Birth/Sex: Treating RN: Dec 22, 1950 (70 y.o. [name] Primary Care Luanne Krzyzanowski: PA [name], NO Other Clinician: Referring Shanise Balch: Treating Italo Banton/Extender: Anselm Pancoast in Treatment: 21 Wound Status Wound Number: 1 Primary Lymphedema Etiology: Wound Location: Left, Dorsal Foot Wound Open Wounding Event: Gradually Appeared Status: Date Acquired: 12/07/2021 Comorbid Cataracts, Anemia, Lymphedema, Congestive Heart Failure, Deep Weeks Of Treatment: 21 History: Vein Thrombosis, Hypertension, Peripheral Venous Disease Clustered Wound: No Photos [name], [name] ([id number]) [id number] Page 9 of 13 Wound Measurements Length: (cm) 0.6 Width: (cm) 0.5 Depth: (cm) 0.1 Area: (cm) 0.236 Volume: (cm) 0.024 % Reduction in Area: 93.8% % Reduction in Volume: 98.4% Epithelialization: Large (67-100%) Tunneling: No Undermining: No Wound Description Classification: Full Thickness Without Exposed Suppor Wound Margin: Distinct, outline attached Exudate Amount: Medium Exudate Type:  Serosanguineous Exudate Color: red, brown t Structures Foul Odor After Cleansing: No Slough/Fibrino No Wound Bed Granulation Amount: Large (67-100%) Exposed Structure Granulation Quality: Red Fascia Exposed: No Necrotic Amount: None Present (0%) Fat Layer (Subcutaneous Tissue) Exposed: Yes Tendon Exposed: No Muscle Exposed: No Joint Exposed: No Bone Exposed: No Periwound Skin Texture Texture Color No Abnormalities Noted: Yes No Abnormalities Noted: No Atrophie Blanche: No Moisture Cyanosis:  No No Abnormalities Noted: Yes Ecchymosis: No Erythema: No Hemosiderin Staining: Yes Mottled: No Pallor: No Rubor: No Temperature / Pain Temperature: No Abnormality Tenderness on Palpation: Yes Treatment Notes Wound #1 (Foot) Wound Laterality: Dorsal, Left Cleanser Peri-Wound Care Sween Lotion (Moisturizing lotion) Discharge Instruction: Apply moisturizing lotion as directed Topical Primary Dressing KerraCel Ag Gelling Fiber Dressing, 4x5 in (silver alginate) Discharge Instruction: Apply silver alginate to wound bed as instructed Secondary Dressing Woven Gauze Sponge, Non-Sterile 4x4 in Discharge Instruction: Apply over primary dressing as directed. Secured With Compression Wrap ThreePress (3 layer compression wrap) [name], [name] ([id number]) [id number] Page 10 of 13 Discharge Instruction: Apply three layer compression as directed. Compression Stockings Add-Ons Electronic Signature(s) Signed: 07/07/2022 5:46:55 PM By: Deon Pilling RN, BSN Entered By: Deon Pilling on 07/07/2022 12:53:37 -------------------------------------------------------------------------------- Wound Assessment Details Patient Name: Date of Service: MALEK, SKOG [name] 07/07/2022 12:30 PM Medical Record Number: [id number] Patient Account Number: [id number] Date of Birth/Sex: Treating RN: 03/10/1951 (71 y.o. [name] Primary Care Waino Mounsey: PA [name], NO Other Clinician: Referring Fermina Mishkin: Treating Ceriah Kohler/Extender: Anselm Pancoast in Treatment: 21 Wound Status Wound Number: 3 Primary Lymphedema Etiology: Wound Location: Right, Medial Lower Leg Wound Open Wounding Event: Gradually Appeared Status: Date Acquired: 12/07/2021 Comorbid Cataracts, Anemia, Lymphedema, Congestive Heart Failure, Deep Weeks Of Treatment: 21 History: Vein Thrombosis, Hypertension, Peripheral Venous Disease Clustered Wound: No Photos Wound Measurements Length: (cm)  3.5 Width: (cm) 4.5 Depth: (cm) 0.6 Area: (cm) 12.37 Volume: (cm) 7.422 % Reduction in Area: -131.6% % Reduction in Volume: -247.5% Epithelialization: Small (1-33%) Tunneling: No Undermining: No Wound Description Classification: Full Thickness With Exposed Suppo Wound Margin: Distinct, outline attached Exudate Amount: Large Exudate Type: Serosanguineous Exudate Color: red, brown rt Structures Foul Odor After Cleansing: Yes Due to Product Use: No Slough/Fibrino Yes Wound Bed Granulation Amount: Medium (34-66%) Exposed Structure Granulation Quality: Red, Hyper-granulation Fascia Exposed: No Necrotic Amount: Medium (34-66%) Fat Layer (Subcutaneous Tissue) Exposed: Yes Necrotic Quality: Adherent Slough Tendon Exposed: No Muscle Exposed: No Joint Exposed: No Bone Exposed: No [id number] TAIQUAN, [name] ([id number]) [id number] Page 11 of 13 No Abnormalities Noted: Yes No Abnormalities Noted: No Erythema: No Moisture Hemosiderin Staining: Yes No Abnormalities Noted: Yes Temperature / Pain Temperature: No Abnormality Tenderness on Palpation: Yes Treatment Notes Wound #3 (Lower Leg) Wound Laterality: Right, Medial Cleanser Peri-Wound Care Sween Lotion (Moisturizing lotion) Discharge Instruction: Apply moisturizing lotion as directed Topical Primary Dressing IODOFLEX 0.9% Cadexomer Iodine Pad 4x6 cm Discharge Instruction: Apply to wound bed as instructed Secondary Dressing ABD Pad, 8x10 Discharge Instruction: Apply over primary dressing as directed. Woven Gauze Sponge, Non-Sterile 4x4 in Discharge Instruction: Apply over primary dressing as directed. Zetuvit Plus 4x8 in Discharge Instruction: Apply over primary dressing as directed. Secured With Compression Wrap ThreePress (3 layer compression wrap) Discharge Instruction: Apply three layer compression as directed. Compression Stockings Add-Ons Electronic  Signature(s) Signed: 07/07/2022 5:46:55 PM By: Deon Pilling RN, BSN Entered By: Deon Pilling on 07/07/2022 12:54:05 -------------------------------------------------------------------------------- Wound Assessment Details Patient Name: Date of Service: [name], WIEDEMANN 07/07/2022 12:30 PM Medical Record Number: [id number] Patient  Account Number: [id number] Date of Birth/Sex: Treating RN: 1951-01-14 (71 y.o. [name] Primary Care Aloysius Heinle: PA [name], NO Other Clinician: Referring Chantrice Hagg: Treating Kareen Jefferys/Extender: Anselm Pancoast in Treatment: 21 Wound Status Wound Number: 6 Primary Calcinosis Etiology: Wound Location: Right, Anterior Lower Leg Wound Open Wounding Event: Gradually Appeared Status: Date Acquired: 05/12/2022 Comorbid Cataracts, Anemia, Lymphedema, Congestive Heart Failure, Deep Weeks Of Treatment: 8 History: Vein Thrombosis, Hypertension, Peripheral Venous Disease Clustered Wound: No Photos KYAL, ARTS ([id number]) [id number] Page 12 of 13 Wound Measurements Length: (cm) 0.5 Width: (cm) 0.3 Depth: (cm) 0.3 Area: (cm) 0.118 Volume: (cm) 0.035 % Reduction in Area: 50% % Reduction in Volume: -45.8% Epithelialization: None Tunneling: Yes Position (o'clock): 1 Maximum Distance: (cm) 0.3 Undermining: No Wound Description Classification: Full Thickness Without Exposed Suppor Wound Margin: Epibole Exudate Amount: Medium Exudate Type: Serosanguineous Exudate Color: red, brown t Structures Foul Odor After Cleansing: No Slough/Fibrino Yes Wound Bed Granulation Amount: Large (67-100%) Exposed Structure Granulation Quality: Red Fascia Exposed: No Necrotic Amount: Small (1-33%) Fat Layer (Subcutaneous Tissue) Exposed: Yes Necrotic Quality: Adherent Slough Tendon Exposed: No Muscle Exposed: No Joint Exposed: No Bone Exposed: No Periwound Skin Texture Texture Color No Abnormalities Noted: Yes No  Abnormalities Noted: No Atrophie Blanche: No Moisture Cyanosis: No No Abnormalities Noted: Yes Ecchymosis: No Erythema: No Hemosiderin Staining: Yes Mottled: No Pallor: No Rubor: No Temperature / Pain Temperature: No Abnormality Tenderness on Palpation: Yes Treatment Notes Wound #6 (Lower Leg) Wound Laterality: Right, Anterior Cleanser Peri-Wound Care Sween Lotion (Moisturizing lotion) Discharge Instruction: Apply moisturizing lotion as directed Topical Primary Dressing Promogran Prisma Matrix, 4.34 (sq in) (silver collagen) Discharge Instruction: Moisten collagen with saline or hydrogel Secondary Dressing ABD Pad, 8x10 Discharge Instruction: Apply over primary dressing as directed. Woven Gauze Sponge, Non-Sterile 4x4 in [place], [name] ([id number]) [id number] Page 13 of 13 Discharge Instruction: Apply over primary dressing as directed. Zetuvit Plus 4x8 in Discharge Instruction: Apply over primary dressing as directed. Secured With Compression Wrap ThreePress (3 layer compression wrap) Discharge Instruction: Apply three layer compression as directed. Compression Stockings Add-Ons Electronic Signature(s) Signed: 07/07/2022 5:46:55 PM By: Deon Pilling RN, BSN Entered By: Deon Pilling on 07/07/2022 12:52:55 -------------------------------------------------------------------------------- Vitals Details Patient Name: Date of Service: [name] 07/07/2022 12:30 PM Medical Record Number: [id number] Patient Account Number: [id number] Date of Birth/Sex: Treating RN: 1950-10-12 (71 y.o. [name] Primary Care Cleon Signorelli: PA [name], NO Other Clinician: Referring Augustine Leverette: Treating Thalia Turkington/Extender: Anselm Pancoast in Treatment: 21 Vital Signs Time Taken: 12:54 Temperature (F): 99.1 Height (in): 71 Pulse (bpm): 58 Weight (lbs): 267 Respiratory Rate (breaths/min): 18 Body Mass Index (BMI): 37.2 Blood Pressure (mmHg):  163/79 Reference Range: 80 - 120 mg / dl Electronic Signature(s) Signed: 07/07/2022 5:46:55 PM By: Deon Pilling RN, BSN Entered By: Deon Pilling on 07/07/2022 12:54:26

## 2022-07-08 NOTE — Progress Notes (Signed)
BART, ASHFORD ([id number]) [id number] Page 1 of 10 Visit Report for 07/07/2022 Debridement Details Patient Name: Date of Service: [name], [name] 07/07/2022 12:30 PM Medical Record Number: [id number] Patient Account Number: [id number] Date of Birth/Sex: Treating RN: March 05, 1951 (71 y.o. Hessie Diener Primary Care Provider: PA [name], NO Other Clinician: Referring Provider: Treating Provider/Extender: Anselm Pancoast in Treatment: 21 Debridement Performed for Assessment: Wound #3 Right,Medial Lower Leg Performed By: Physician Ricard Dillon., MD Debridement Type: Debridement Level of Consciousness (Pre-procedure): Awake and Alert Pre-procedure Verification/Time Out Yes - 13:05 Taken: Start Time: 13:06 Pain Control: Lidocaine 4% T opical Solution T Area Debrided (L x W): otal 3.5 (cm) x 4.5 (cm) = 15.75 (cm) Tissue and other material debrided: Viable, Non-Viable, Slough, Subcutaneous, Skin: Dermis , Skin: Epidermis, Slough Level: Skin/Subcutaneous Tissue Debridement Description: Excisional Instrument: Curette Bleeding: Minimum Hemostasis Achieved: Pressure End Time: 13:11 Procedural Pain: 0 Post Procedural Pain: 0 Response to Treatment: Procedure was tolerated well Level of Consciousness (Post- Awake and Alert procedure): Post Debridement Measurements of Total Wound Length: (cm) 3.5 Width: (cm) 4.5 Depth: (cm) 0.6 Volume: (cm) 7.422 Character of Wound/Ulcer Post Debridement: Improved Post Procedure Diagnosis Same as Pre-procedure Electronic Signature(s) Signed: 07/07/2022 5:46:55 PM By: Deon Pilling RN, BSN Signed: 07/07/2022 5:47:10 PM By: Linton Ham MD Entered By: Deon Pilling on 07/07/2022 13:11:39 -------------------------------------------------------------------------------- HPI Details Patient Name: Date of Service: [name] 07/07/2022 12:30 PM Medical Record Number: [id number] Patient Account Number:  [id number] Date of Birth/Sex: Treating RN: 1951-06-24 (71 y.o. M) Primary Care Provider: PA [name], NO Other Clinician: Referring Provider: Treating Provider/Extender: Anselm Pancoast in Treatment: 21 History of Present Illness [name] ([id number]) [id number] Page 2 of 10 HPI Description: ADMISSION 02/09/2022 This is a 71 year old man who recently moved to [place] from [place]. He has a history of of coronary artery disease and prior left popliteal vein DVT . He is not diabetic and does not smoke. 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 He has had multiple spinal surgeries and has limited mobility. He primarily uses a motorized wheelchair. He has had lymphedema for many years and does have lymphedema pumps although he says he does not use them frequently. He has wounds on his bilateral lower extremities. He was receiving care in [place] for these prior to his move. They were apparently packing his wounds with Betadine soaked gauze. He has worn compression wraps in the past but not recently. He did say that they helped tremendously. In clinic today, his right ABI is noncompressible but has good signals; the left ABI was 1.17. No formal vascular studies have been performed. On his left dorsal foot, there is a circular lesion that exposes the fat layer. There is fairly heavy slough accumulation within the wound, but no significant odor. On his right medial calf, he has multiple pinholes that have slough buildup in them and probe for about 2 to 4 mm in depth. They are draining clear fluid. On his right lateral midfoot, he has ulceration consistent with venous stasis. It is geographic and has slough accumulation. 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 02/17/2022: All of the wounds are little bit larger today and have more slough  accumulation. Today, the medial calf openings are larger and probe to something that feels calcified. There is odor coming from his wounds. His vascular studies are scheduled for August 9 and 10.  02-24-2022 upon evaluation today patient presents for follow-up concerning his ongoing issues here with his lower extremities. With that being said he does have significant problems with what appears to be cellulitis unfortunately the PCR culture that was sent last week got crushed by [employer] in transit. With that being said we are going to reobtain a culture today although I am actually to do it on the right medial leg as this seems to be the most inflamed area today where there is some questionable drainage as well. I am going to remove some of the calcium deposits which are loosening obtain a deeper culture from this area. Fortunately I do not see any evidence of active infection systemically which is good news but locally I think we are having a bigger issue here. He does have his appointment next Thursday with vascular. Patient has also been referred to [facility] for further evaluation and treatment of the calcinosis for possible sulfate injections. 03/04/2022: The culture that was performed last week grew out multiple species including Klebsiella, Morganella, and Pseudomonas aeruginosa. He had been prescribed Bactrim but this was not adequate to cover all of the species and levofloxacin was recommended. He completed the Bactrim but did not pick up the levofloxacin and begin taking it until yesterday. 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 He had arterial studies performed today. He does have evidence of peripheral arterial disease. Results copied here: +-------+-----------+-----------+------------+------------+ ABI/TBIT oday's ABIT  oday's TBIPrevious ABIPrevious TBI +-------+-----------+-----------+------------+------------+ Right Troup 0.75    +-------+-----------+-----------+------------+------------+ Left  0.57    +-------+-----------+-----------+------------+------------+ Arterial wall calcification precludes accurate ankle pressures and ABIs. Summary: Right: Resting right ankle-brachial index indicates noncompressible right lower extremity arteries. The right toe-brachial index is normal. Left: Resting left ankle-brachial index indicates noncompressible left lower extremity arteries. The left toe-brachial index is abnormal. He continues to have further tissue breakdown at the right medial calf and there is ample slough accumulation along with necrotic subcutaneous tissue. The left dorsal foot wound has built up slough, as well. The right medial foot wound is nearly closed with just a light layer of eschar over the surface. The right dorsal lateral foot wound has also accumulated a fair amount of slough and remains quite tender. 03/10/2022: He has been taking the prescribed levofloxacin and has about 3 days left of this. The erythema and induration on his right leg has improved. He continues to experience further breakdown of the right medial calf wound. There is extensive slough and debris accumulation there. There is heavy slough on his left dorsal foot wound, but no odor or purulent drainage. The right medial foot wound appears to be closed. The right dorsal lateral foot wound also has a fair amount of slough but it is less tender than at our last visit. He still does not have an appointment with dermatology. 03/22/2022: The right anterior tibial wound has closed. The right lateral foot wound is nearly closed with just a little bit of slough. The left dorsal foot wound is smaller and continues to have some slough buildup but less so than at prior visits. There is good granulation tissue underlying the  slough. Unfortunately the right medial calf wound continues to deteriorate. It has a foul odor today and a lot of necrotic tissue, the surrounding skin is also erythematous and moist. 03/30/2022: The right lateral foot wound continues to contract and  just has a little bit of slough and eschar present. The left dorsal foot wound is also smaller with slough overlying good granulation tissue. The right medial calf wound actually looks quite a bit better today; there is no odor and there is much less necrotic tissue although some remains present. We have been using topical gentamicin and he has been taking oral Augmentin. 04/07/2022: The patient saw dermatology at [facility] last week. Unfortunately, it appears that they did not read any of the documentation that was provided. 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 Overall, it was a highly unsatisfactory consultation. In the meantime, however, we were able to identify compounding pharmacy who could create a topical sodium thiosulfate ointment. The patient has that with him today. Both of the right foot wounds are now closed. The left dorsal foot wound has contracted but still has a layer of slough on the surface. The medial calf wounds on the right all look a little bit better. They still have heavy slough along with the chunks of calcium. Everything is stained purple. 04/14/2022: The wound on his dorsal left foot is a little bit smaller again today. There is still slough accumulation on the surface. The medial calf wounds have quite a bit less slough accumulation today. His right leg, however, is warm and erythematous, concerning for cellulitis. 04/14/2022: He completed his course of  Augmentin yesterday. The medial calf wound sites are much cleaner today and shallower. There is less fragmented calcium in the wounds. He has a lot of lymphedema fluid-like drainage from these wounds, but no purulent discharge or malodor. The wound on his dorsal left foot has also contracted and is shallower. There is a layer of slough over healthy granulation tissue. 05/05/2022: The left dorsal foot wound is smaller today with less slough and more granulation tissue. The right medial calf wounds are shallower, but have extensive slough and some fat necrosis. The drainage has diminished considerably, however. 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 05/12/2022: The left dorsal foot wound continues to contract and fill with granulation tissue. There is slough on the wound surface. The right medial calf wounds also continue to fill with healthy tissue, but there is remaining slough and fat necrosis present. He had a significant increase in his drainage this week and it was a bright yellow-green color. He also had a new wound open over his right anterior tibial surface associated with the spike of cutaneous calcium. The leg is more red, as well, concerning for infection. TEDDRICK, MALLARI ([id number]) [id number] Page 3 of 10 05/19/2022: His culture returned positive for Pseudomonas. He is currently taking a course of oral levofloxacin. We have also been using topical gentamicin mixed in with his sodium thiosulfate. All of the wounds look better this week. 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 There is slough and nonviable subcutaneous tissue still present. The new anterior tibial wound just has a light layer of slough. The dorsal foot wound also has some slough present and appears a little dry today. 05/26/2022: The right  medial leg wound continues to improve. It is feeling with granulation tissue,  but still accumulates a fairly thick layer of slough on the surface. The more recent anterior tibial wound has remained quite small, but also has some slough on the surface. The left dorsal foot wound has contracted somewhat and has perimeter epithelialization. He completed his oral levofloxacin. 06/02/2022: The left dorsal foot wound continues to improve significantly. There is just a little slough on the wound surface. The right medial leg wound had black drainage, but it looks like silver alginate was applied last week and I theorized that silver sulfide was created with a combination of the silver alginate and the sodium thiosulfate. It did, however, have a bit of an odor to it and extensive slough accumulation. The small anterior tibial wound also had a bit of slough and nonviable subcutaneous tissue present. The skin of his leg was rather macerated, as well. 06/09/2022: The left dorsal foot wound is smaller again this week with just a light layer of slough on the surface. The right medial leg wound looks substantially better. The leg and periwound skin are no longer red and macerated. There is good granulation tissue forming with less slough and nonviable tissue. The anterior tibial wound just has a small amount of slough accumulation. 06/16/2022: The left dorsal foot wound continues to contract. His wrap slipped a little bit, however, and his foot is more swollen. The right medial leg wound continues to improve. The underlying tissue is more vital-appearing and there is less slough. He does have substantial drainage. The small anterior tibial wound has a bit of slough accumulation. 06/23/2022: The left dorsal foot wound is about half the size as it was last week. There is just some light slough on the surface and some eschar buildup around the edges. The right anterior leg wound is small with a little slough on the  surface. Unfortunately, the right medial leg wound deteriorated fairly substantially. It is malodorous with gray/green/black discoloration. 06/30/2022: The left dorsal foot wound continues to contract. It is nearly closed with just a light layer of slough present. The right anterior leg wound is about the same size and has a small amount of slough on the surface. The right medial leg wound looks a lot better than it did last week. The odor has abated and the tissue is less pulpy and necrotic-looking. There is still a fairly thick layer of slough present. 12/13; the left dorsal foot wound looks very healthy. There was no need to change the dressing here and no debridement. He has a small area on the right anterior lower leg apparently had not changed much in size. The area on the medial leg is the most problematic area this is large with some depth and a completely nonviable surface today. Electronic Signature(s) Signed: 07/07/2022 5:47:10 PM By: Linton Ham MD Entered By: Linton Ham on 07/07/2022 13:22:41 -------------------------------------------------------------------------------- Physical Exam Details Patient Name: Date of Service: MATTIA, LIFORD 07/07/2022 12:30 PM Medical Record Number: [id number] Patient Account Number: [id number] Date of Birth/Sex: Treating RN: 26-Jul-1951 (71 y.o. M) Primary Care Provider: PA [name], NO Other Clinician: Referring Provider: Treating Provider/Extender: Anselm Pancoast in Treatment: 21 Constitutional Patient is hypertensive.. Pulse regular and within target range for patient.Marland Kitchen Respirations regular, non-labored and within target range.. Temperature is normal and within the target range for the patient.Marland Kitchen Appears in no distress. Cardiovascular Pedal pulses are palpable. His edema control is quite good. Notes Wound exam The most difficult wound is the area on the right medial lower leg. Completely nonviable surface  debrided with  a #5 curette which she tolerated reasonably well. I was able to clean up most of the surface. It is not just the visible slough but a very gritty tactile surface. Subcutaneous debridement which looked fairly good up afterwards. Hemostasis with direct pressure His other 2 wounds on the left dorsal foot and right anterior lower leg superiorly to his larger wound looks satisfactory Electronic Signature(s) Signed: 07/07/2022 5:47:10 PM By: Linton Ham MD Entered By: Linton Ham on 07/07/2022 13:25:04 [name] ([id number]) [id number] Page 4 of 10 -------------------------------------------------------------------------------- Physician Orders Details Patient Name: Date of Service: JURGEN, GROENEVELD 07/07/2022 12:30 PM Medical Record Number: [id number] Patient Account Number: [id number] Date of Birth/Sex: Treating RN: 01/10/1951 (71 y.o. Hessie Diener Primary Care Provider: PA [name], NO Other Clinician: Referring Provider: Treating Provider/Extender: Anselm Pancoast in Treatment: 21 Verbal / Phone Orders: No Diagnosis Coding Follow-up Appointments ppointment in 1 week. - Dr. [name] Rm 1 Return A ppointment in 2 weeks. - Dr. [name] Room 1 Return A Anesthetic Wound #1 Left,Dorsal Foot (In clinic) Topical Lidocaine 4% applied to wound bed Wound #3 Right,Medial Lower Leg (In clinic) Topical Lidocaine 4% applied to wound bed Wound #6 Right,Anterior Lower Leg (In clinic) Topical Lidocaine 4% applied to wound bed Bathing/ Shower/ Hygiene May shower with protection but do not get wound dressing(s) wet. Edema Control - Lymphedema / SCD / Other Lymphedema Pumps. Use Lymphedema pumps on leg(s) 2-3 times a day for 45-60 minutes. If wearing any wraps or hose, do not remove them. Continue exercising as instructed. - use 1-2 times per day over wraps Elevate legs to the level of the heart or above for 30 minutes daily and/or when sitting, a  frequency of: Exercise regularly Wound Treatment Wound #1 - Foot Wound Laterality: Dorsal, Left Peri-Wound Care: Sween Lotion (Moisturizing lotion) 1 x Per Week/30 Days Discharge Instructions: Apply moisturizing lotion as directed Prim Dressing: KerraCel Ag Gelling Fiber Dressing, 4x5 in (silver alginate) 1 x Per Week/30 Days ary Discharge Instructions: Apply silver alginate to wound bed as instructed Secondary Dressing: Woven Gauze Sponge, Non-Sterile 4x4 in 1 x Per Week/30 Days Discharge Instructions: Apply over primary dressing as directed. Compression Wrap: ThreePress (3 layer compression wrap) 1 x Per Week/30 Days Discharge Instructions: Apply three layer compression as directed. Wound #3 - Lower Leg Wound Laterality: Right, Medial Peri-Wound Care: Sween Lotion (Moisturizing lotion) 1 x Per Week/30 Days Discharge Instructions: Apply moisturizing lotion as directed Prim Dressing: IODOFLEX 0.9% Cadexomer Iodine Pad 4x6 cm 1 x Per Week/30 Days ary Discharge Instructions: Apply to wound bed as instructed Secondary Dressing: ABD Pad, 8x10 1 x Per Week/30 Days Discharge Instructions: Apply over primary dressing as directed. Secondary Dressing: Woven Gauze Sponge, Non-Sterile 4x4 in 1 x Per Week/30 Days Discharge Instructions: Apply over primary dressing as directed. Secondary Dressing: Zetuvit Plus 4x8 in 1 x Per Week/30 Days Discharge Instructions: Apply over primary dressing as directed. Compression Wrap: ThreePress (3 layer compression wrap) 1 x Per Week/30 Days Discharge Instructions: Apply three layer compression as directed. Wound #6 - Lower Leg Wound Laterality: Right, Anterior Peri-Wound Care: Sween Lotion (Moisturizing lotion) 1 x Per Week/30 Days Discharge Instructions: Apply moisturizing lotion as directed JOSS, MCDILL ([id number]) [id number] Page 5 of 10 Prim Dressing: Promogran Prisma Matrix, 4.34 (sq in) (silver collagen) 1 x Per Week/30  Days ary Discharge Instructions: Moisten collagen with saline or hydrogel Secondary Dressing: ABD Pad, 8x10 1 x Per Week/30 Days Discharge Instructions: Apply over primary dressing  as directed. Secondary Dressing: Woven Gauze Sponge, Non-Sterile 4x4 in 1 x Per Week/30 Days Discharge Instructions: Apply over primary dressing as directed. Secondary Dressing: Zetuvit Plus 4x8 in 1 x Per Week/30 Days Discharge Instructions: Apply over primary dressing as directed. Compression Wrap: ThreePress (3 layer compression wrap) 1 x Per Week/30 Days Discharge Instructions: Apply three layer compression as directed. Electronic Signature(s) Signed: 07/07/2022 5:46:55 PM By: Deon Pilling RN, BSN Signed: 07/07/2022 5:47:10 PM By: Linton Ham MD Entered By: Deon Pilling on 07/07/2022 13:15:51 -------------------------------------------------------------------------------- Problem List Details Patient Name: Date of Service: HADLEY, DETLOFF 07/07/2022 12:30 PM Medical Record Number: [id number] Patient Account Number: [id number] Date of Birth/Sex: Treating RN: 07-11-1951 (71 y.o. 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 02/09/2022 No Yes 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) 02/09/2022 No Yes I10 Essential (primary) hypertension 02/09/2022 No Yes I25.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 02/09/2022 No Yes I89.0 Lymphedema, not elsewhere classified 02/09/2022 No Yes Z86.718 Personal history of other venous thrombosis and embolism  02/09/2022 No Yes ALEXSANDRO, SALEK ([id number]) [id number] Page 6 of 10 Inactive Problems ICD-10 Code Description Active Date Inactive Date L97.512 Non-pressure chronic ulcer of other part of right foot with fat layer exposed 02/17/2022 02/17/2022 Resolved Problems Electronic Signature(s) Signed: 07/07/2022 5:47:10 PM By: Linton Ham MD Entered By: Linton Ham on 07/07/2022 13:21:45 -------------------------------------------------------------------------------- Progress Note Details Patient Name: Date of Service: [name] 07/07/2022 12:30 PM Medical Record Number: [id number] Patient Account Number: [id number] Date of Birth/Sex: Treating RN: January 07, 1951 (71 y.o. M) Primary Care Provider: PA [name], NO Other Clinician: Referring Provider: Treating Provider/Extender: Bedelia Person, [name] in Treatment: 21 Subjective History of Present Illness (HPI) ADMISSION 02/09/2022 This is a 71 year old man who recently moved to [place] from [place]. He has a history of of coronary artery disease and prior left popliteal vein DVT . He is not diabetic and does not smoke. 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 He has had multiple spinal surgeries and has limited mobility. He primarily uses a motorized wheelchair. He has had lymphedema for many years and does have lymphedema pumps although he says he does not use them frequently. He has wounds on his bilateral lower extremities. He was receiving care in [place] for these prior to his move. They were apparently packing his wounds with Betadine soaked gauze. He has worn compression wraps in the past but not recently. He did say that they helped tremendously. In clinic today, his right ABI is noncompressible but has good signals; the left ABI was 1.17. No formal vascular studies have been performed. On his left dorsal foot, there is a circular lesion that exposes the fat layer. There is  fairly heavy slough accumulation within the wound, but no significant odor. On his right medial calf, he has multiple pinholes that have slough buildup in them and probe for about 2 to 4 mm in depth. They are draining clear fluid. On his right lateral midfoot, he has ulceration consistent with venous stasis. It is geographic and has slough accumulation. He has changes in his lower extremities consistent with stasis dermatitis, but  no hemosiderin deposition or thickening of the skin. 02/17/2022: All of the wounds are little bit larger today and have more slough accumulation. Today, the medial calf openings are larger and probe to something that feels calcified. There is odor coming from his wounds. His vascular studies are scheduled for August 9 and 10. 02-24-2022 upon evaluation today patient presents for follow-up concerning his ongoing issues here with his lower extremities. With that being said he does have significant problems with what appears to be cellulitis unfortunately the PCR culture that was sent last week got crushed by [employer] in transit. With that being said we are going to reobtain a culture today although I am actually to do it on the right medial leg as this seems to be the most inflamed area today where there is some questionable drainage as well. I am going to remove some of the calcium deposits which are loosening obtain a deeper culture from this area. Fortunately I do not see any evidence of active infection systemically which is good news but locally I think we are having a bigger issue here. He does have his appointment next Thursday with vascular. Patient has also been referred to [facility] for further evaluation and treatment of the calcinosis for possible sulfate injections. 03/04/2022: The culture that was performed last week grew out multiple species including Klebsiella, Morganella, and Pseudomonas aeruginosa. He had been prescribed Bactrim but this was not adequate to cover  all of the species and levofloxacin was recommended. He completed the Bactrim but did not pick up the levofloxacin and begin taking it until yesterday. 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 He had arterial studies performed today. He does have evidence of peripheral arterial disease. Results copied here: +-------+-----------+-----------+------------+------------+ ABI/TBIT oday's ABIT oday's TBIPrevious ABIPrevious TBI +-------+-----------+-----------+------------+------------+ Right [place] 0.75    +-------+-----------+-----------+------------+------------+ Left [place] 0.57    +-------+-----------+-----------+------------+------------+ Arterial wall calcification precludes accurate ankle pressures and ABIs. CLYDELL, SPOSITO ([id number]) [id number] Page 7 of 10 Summary: Right: Resting right ankle-brachial index indicates noncompressible right lower extremity arteries. The right toe-brachial index is normal. Left: Resting left ankle-brachial index indicates noncompressible left lower extremity arteries. The left toe-brachial index is abnormal. He continues to have further tissue breakdown at the right medial calf and there is ample slough accumulation along with necrotic subcutaneous tissue. The left dorsal foot wound has built up slough, as well. The right medial foot wound is nearly closed with just a light layer of eschar over the surface. The right dorsal lateral foot wound has also accumulated a fair amount of slough and remains quite tender. 03/10/2022: He has been taking the prescribed levofloxacin and has about 3 days left of this. The erythema and induration on his right leg has improved. He continues to experience further breakdown of the right medial calf wound. There is extensive  slough and debris accumulation there. There is heavy slough on his left dorsal foot wound, but no odor or purulent drainage. The right medial foot wound appears to be closed. The right dorsal lateral foot wound also has a fair amount of slough but it is less tender than at our last visit. He still does not have an appointment with dermatology. 03/22/2022: The right anterior tibial wound has closed. The right lateral foot wound is nearly closed with just a little bit of slough. The left dorsal foot wound is  smaller and continues to have some slough buildup but less so than at prior visits. There is good granulation tissue underlying the slough. Unfortunately the right medial calf wound continues to deteriorate. It has a foul odor today and a lot of necrotic tissue, the surrounding skin is also erythematous and moist. 03/30/2022: The right lateral foot wound continues to contract and just has a little bit of slough and eschar present. The left dorsal foot wound is also smaller with slough overlying good granulation tissue. The right medial calf wound actually looks quite a bit better today; there is no odor and there is much less necrotic tissue although some remains present. We have been using topical gentamicin and he has been taking oral Augmentin. 04/07/2022: The patient saw dermatology at [facility] last week. Unfortunately, it appears that they did not read any of the documentation that was provided. 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 Overall, it was a highly unsatisfactory consultation. In the meantime, however, we were able to identify compounding pharmacy who could create a topical sodium thiosulfate ointment. The patient has  that with him today. Both of the right foot wounds are now closed. The left dorsal foot wound has contracted but still has a layer of slough on the surface. The medial calf wounds on the right all look a little bit better. They still have heavy slough along with the chunks of calcium. Everything is stained purple. 04/14/2022: The wound on his dorsal left foot is a little bit smaller again today. There is still slough accumulation on the surface. The medial calf wounds have quite a bit less slough accumulation today. His right leg, however, is warm and erythematous, concerning for cellulitis. 04/14/2022: He completed his course of Augmentin yesterday. The medial calf wound sites are much cleaner today and shallower. There is less fragmented calcium in the wounds. He has a lot of lymphedema fluid-like drainage from these wounds, but no purulent discharge or malodor. The wound on his dorsal left foot has also contracted and is shallower. There is a layer of slough over healthy granulation tissue. 05/05/2022: The left dorsal foot wound is smaller today with less slough and more granulation tissue. The right medial calf wounds are shallower, but have extensive slough and some fat necrosis. The drainage has diminished considerably, however. 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 05/12/2022: The left dorsal foot wound continues to contract and fill with granulation tissue. There is slough on the wound surface. The right medial calf wounds also continue to fill with healthy tissue, but there is remaining slough and fat necrosis present. He had a significant increase in his drainage this week and it was a bright yellow-green color. He also had a new wound open over his right anterior tibial surface associated with the spike of cutaneous calcium. The leg is more red, as well, concerning for  infection. 05/19/2022: His culture returned positive for Pseudomonas. He is currently taking a course of oral levofloxacin. We have also been using topical gentamicin mixed in with his sodium thiosulfate. All of the wounds look better this week. The ones associated with his calcinosis cutis have  filled in substantially. There is slough and nonviable subcutaneous tissue still present. The new anterior tibial wound just has a light layer of slough. The dorsal foot wound also has some slough present and appears a little dry today. 05/26/2022: The right medial leg wound continues to improve. It is feeling with granulation tissue, but still accumulates a fairly thick layer of slough on the surface. The more recent anterior tibial wound has remained quite small, but also has some slough on the surface. The left dorsal foot wound has contracted somewhat and has perimeter epithelialization. He completed his oral levofloxacin. 06/02/2022: The left dorsal foot wound continues to improve significantly. There is just a little slough on the wound surface. The right medial leg wound had black drainage, but it looks like silver alginate was applied last week and I theorized that silver sulfide was created with a combination of the silver alginate and the sodium thiosulfate. It did, however, have a bit of an odor to it and extensive slough accumulation. The small anterior tibial wound also had a bit of slough and nonviable subcutaneous tissue present. The skin of his leg was rather macerated, as well. 06/09/2022: The left dorsal foot wound is smaller again this week with just a light layer of slough on the surface. The right medial leg wound looks substantially better. The leg and periwound skin are no longer red and macerated. There is good granulation tissue forming with less slough and nonviable tissue. The anterior tibial wound just has a small amount of slough accumulation. 06/16/2022: The left dorsal foot wound  continues to contract. His wrap slipped a little bit, however, and his foot is more swollen. The right medial leg wound continues to improve. The underlying tissue is more vital-appearing and there is less slough. He does have substantial drainage. The small anterior tibial wound has a bit of slough accumulation. 06/23/2022: The left dorsal foot wound is about half the size as it was last week. There is just some light slough on the surface and some eschar buildup around the edges. The right anterior leg wound is small with a little slough on the surface. Unfortunately, the right medial leg wound deteriorated fairly substantially. It is malodorous with gray/green/black discoloration. 06/30/2022: The left dorsal foot wound continues to contract. It is nearly closed with just a light layer of slough present. The right anterior leg wound is about the same size and has a small amount of slough on the surface. The right medial leg wound looks a lot better than it did last week. The odor has abated and the tissue is less pulpy and necrotic-looking. There is still a fairly thick layer of slough present. 12/13; the left dorsal foot wound looks very healthy. There was no need to change the dressing here and no debridement. He has a small area on the right anterior lower leg apparently had not changed much in size. The area on the medial leg is the most problematic area this is large with some depth and a completely nonviable surface today. YASUO, PHIMMASONE ([id number]) [id number] Page 8 of 10 Constitutional Patient is hypertensive.. Pulse regular and within target range for patient.Marland Kitchen Respirations regular, non-labored and within target range.. Temperature is normal and within the target range for the patient.Marland Kitchen Appears in no distress. Vitals Time Taken: 12:54 PM, Height: 71 in, Weight: 267 lbs, BMI: 37.2, Temperature: 99.1 F, Pulse: 58 bpm, Respiratory Rate: 18 breaths/min,  Blood Pressure: 163/79 mmHg. Cardiovascular Pedal pulses are palpable. His edema control  is quite good. General Notes: Wound exam oo The most difficult wound is the area on the right medial lower leg. Completely nonviable surface debrided with a #5 curette which she tolerated reasonably well. I was able to clean up most of the surface. It is not just the visible slough but a very gritty tactile surface. Subcutaneous debridement which looked fairly good up afterwards. Hemostasis with direct pressure oo His other 2 wounds on the left dorsal foot and right anterior lower leg superiorly to his larger wound looks satisfactory Integumentary (Hair, Skin) Wound #1 status is Open. Original cause of wound was Gradually Appeared. The date acquired was: 12/07/2021. The wound has been in treatment 21 weeks. The wound is located on the Left,Dorsal Foot. The wound measures 0.6cm length x 0.5cm width x 0.1cm depth; 0.236cm^2 area and 0.024cm^3 volume. There is Fat Layer (Subcutaneous Tissue) exposed. There is no tunneling or undermining noted. There is a medium amount of serosanguineous drainage noted. The wound margin is distinct with the outline attached to the wound base. There is large (67-100%) red granulation within the wound bed. There is no necrotic tissue within the wound bed. The periwound skin appearance had no abnormalities noted for texture. The periwound skin appearance had no abnormalities noted for moisture. The periwound skin appearance exhibited: Hemosiderin Staining. The periwound skin appearance did not exhibit: Atrophie Blanche, Cyanosis, Ecchymosis, Mottled, Pallor, Rubor, Erythema. Periwound temperature was noted as No Abnormality. The periwound has tenderness on palpation. Wound #3 status is Open. Original cause of wound was Gradually Appeared. The date acquired was: 12/07/2021. The wound has been in treatment 21 weeks. The wound is located on the Right,Medial Lower Leg. The wound measures  3.5cm length x 4.5cm width x 0.6cm depth; 12.37cm^2 area and 7.422cm^3 volume. There is Fat Layer (Subcutaneous Tissue) exposed. There is no tunneling or undermining noted. There is a large amount of serosanguineous drainage noted. Foul odor after cleansing was noted. The wound margin is distinct with the outline attached to the wound base. There is medium (34-66%) red, hyper - granulation within the wound bed. There is a medium (34-66%) amount of necrotic tissue within the wound bed including Adherent Slough. The periwound skin appearance had no abnormalities noted for texture. The periwound skin appearance had no abnormalities noted for moisture. The periwound skin appearance exhibited: Hemosiderin Staining. The periwound skin appearance did not exhibit: Erythema. Periwound temperature was noted as No Abnormality. The periwound has tenderness on palpation. Wound #6 status is Open. Original cause of wound was Gradually Appeared. The date acquired was: 05/12/2022. The wound has been in treatment 8 weeks. The wound is located on the Right,Anterior Lower Leg. The wound measures 0.5cm length x 0.3cm width x 0.3cm depth; 0.118cm^2 area and 0.035cm^3 volume. There is Fat Layer (Subcutaneous Tissue) exposed. There is no undermining noted, however, there is tunneling at 1:00 with a maximum distance of 0.3cm. There is a medium amount of serosanguineous drainage noted. The wound margin is epibole. There is large (67-100%) red granulation within the wound bed. There is a small (1-33%) amount of necrotic tissue within the wound bed including Adherent Slough. The periwound skin appearance had no abnormalities noted for texture. The periwound skin appearance had no abnormalities noted for moisture. The periwound skin appearance exhibited: Hemosiderin Staining. The periwound skin appearance did not exhibit: Atrophie Blanche, Cyanosis, Ecchymosis, Mottled, Pallor, Rubor, Erythema. Periwound temperature was noted  as No Abnormality. The periwound has tenderness on palpation. 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 There was a Excisional Skin/Subcutaneous Tissue Debridement with a total area of 15.75 sq cm performed by Ricard Dillon., MD. With the following instrument(s): Curette to remove Viable and Non- Viable tissue/material. Material removed includes Subcutaneous Tissue, Slough, Skin: Dermis, and Skin: Epidermis after achieving pain control using Lidocaine 4% T opical Solution. A time out was conducted at 13:05, prior to the start of the procedure. A Minimum amount of bleeding was controlled with Pressure. The procedure was tolerated well with a pain level of 0 throughout and a pain level of 0 following the procedure. Post Debridement Measurements: 3.5cm length x 4.5cm width x 0.6cm depth; 7.422cm^3 volume. Character of Wound/Ulcer Post Debridement is improved. Post procedure Diagnosis Wound #3: Same as Pre-Procedure Pre-procedure diagnosis of Wound #3 is a Lymphedema located on the Right,Medial Lower Leg . There was a Three Layer Compression Therapy Procedure by Deon Pilling, RN. Post procedure Diagnosis Wound #3: Same as Pre-Procedure Wound #1 Pre-procedure diagnosis of Wound #1 is a Lymphedema located on the Left,Dorsal Foot . There was a Three Layer Compression Therapy Procedure by Deon Pilling, RN. Post procedure Diagnosis Wound #1: Same as  Pre-Procedure LEVELLE, EDELEN ([id number]) [id number] Page 9 of 10 Wound #6 Pre-procedure diagnosis of Wound #6 is a Calcinosis located on the Right,Anterior Lower Leg . There was a Three Layer Compression Therapy Procedure by Deon Pilling, RN. Post procedure Diagnosis Wound #6: Same as Pre-Procedure Plan Follow-up Appointments: Return Appointment in 1 week. - Dr. [name] Rm 1 Return Appointment in 2 weeks. - Dr. [name] Room 1 Anesthetic: Wound #1 Left,Dorsal Foot: (In clinic) Topical Lidocaine 4% applied to wound bed Wound #3 Right,Medial Lower Leg: (In clinic) Topical Lidocaine 4% applied to wound bed Wound #6 Right,Anterior Lower Leg: (In clinic) Topical Lidocaine 4% applied to wound bed Bathing/ Shower/ Hygiene: May shower with protection but do not get wound dressing(s) wet. Edema Control - Lymphedema / SCD / Other: Lymphedema Pumps. Use Lymphedema pumps on leg(s) 2-3 times a day for 45-60 minutes. If wearing any wraps or hose, do not remove them. Continue exercising as instructed. - use 1-2 times per day over wraps Elevate legs to the level of the heart or above for 30 minutes daily and/or when sitting, a frequency of: Exercise regularly WOUND #1: - Foot Wound Laterality: Dorsal, Left Peri-Wound Care: Sween Lotion (Moisturizing lotion) 1 x Per Week/30 Days Discharge Instructions: Apply moisturizing lotion as directed Prim Dressing: KerraCel Ag Gelling Fiber Dressing, 4x5 in (silver alginate) 1 x Per Week/30 Days ary Discharge Instructions: Apply silver alginate to wound bed as instructed Secondary Dressing: Woven Gauze Sponge, Non-Sterile 4x4 in 1 x Per Week/30 Days Discharge Instructions: Apply over primary dressing as directed. Com pression Wrap: ThreePress (3 layer compression wrap) 1 x Per Week/30 Days Discharge Instructions: Apply three layer compression as directed. WOUND #3: - Lower Leg Wound Laterality: Right, Medial Peri-Wound Care: Sween  Lotion (Moisturizing lotion) 1 x Per Week/30 Days Discharge Instructions: Apply moisturizing lotion as directed Prim Dressing: IODOFLEX 0.9% Cadexomer Iodine Pad 4x6 cm 1 x Per Week/30 Days ary Discharge Instructions:  Apply to wound bed as instructed Secondary Dressing: ABD Pad, 8x10 1 x Per Week/30 Days Discharge Instructions: Apply over primary dressing as directed. Secondary Dressing: Woven Gauze Sponge, Non-Sterile 4x4 in 1 x Per Week/30 Days Discharge Instructions: Apply over primary dressing as directed. Secondary Dressing: Zetuvit Plus 4x8 in 1 x Per Week/30 Days Discharge Instructions: Apply over primary dressing as directed. Com pression Wrap: ThreePress (3 layer compression wrap) 1 x Per Week/30 Days Discharge Instructions: Apply three layer compression as directed. WOUND #6: - Lower Leg Wound Laterality: Right, Anterior Peri-Wound Care: Sween Lotion (Moisturizing lotion) 1 x Per Week/30 Days Discharge Instructions: Apply moisturizing lotion as directed Prim Dressing: Promogran Prisma Matrix, 4.34 (sq in) (silver collagen) 1 x Per Week/30 Days ary Discharge Instructions: Moisten collagen with saline or hydrogel Secondary Dressing: ABD Pad, 8x10 1 x Per Week/30 Days Discharge Instructions: Apply over primary dressing as directed. Secondary Dressing: Woven Gauze Sponge, Non-Sterile 4x4 in 1 x Per Week/30 Days Discharge Instructions: Apply over primary dressing as directed. Secondary Dressing: Zetuvit Plus 4x8 in 1 x Per Week/30 Days Discharge Instructions: Apply over primary dressing as directed. Com pression Wrap: ThreePress (3 layer compression wrap) 1 x Per Week/30 Days Discharge Instructions: Apply three layer compression as directed. 1. On the larger wound on the right medial lower leg I change the primary dressing to Iodoflex to see if we can get some ongoing debridement. 2. Otherwise I did not change the primary dressings to the smaller wounds on the right upper leg and the  left foot. 3. Still in 3 layer compression. He tells me he has compression pumps at home but he cannot use them or will not use them Electronic Signature(s) Signed: 07/07/2022 5:47:10 PM By: Linton Ham MD Entered By: Linton Ham on 07/07/2022 13:26:10 [name] ([id number]) [id number] Page 10 of 10 -------------------------------------------------------------------------------- SuperBill Details Patient Name: Date of Service: MILO, SCHREIER 07/07/2022 Medical Record Number: [id number] Patient Account Number: [id number] Date of Birth/Sex: Treating RN: 1951/05/14 (70 y.o. Hessie Diener Primary Care Provider: PA [name], NO Other Clinician: Referring Provider: Treating Provider/Extender: Anselm Pancoast in Treatment: 21 Diagnosis Coding ICD-10 Codes Code Description [phone] 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d L97.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) I10 Essential (primary) hypertension I25.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 I89.0 Lymphedema, not elsewhere classified Z86.718 Personal history of other venous thrombosis and embolism Facility Procedures : CPT4 Code: [id number] Description: 84696 - DEB SUBQ TISSUE 20 SQ CM/< ICD-10 Diagnosis Description E95.284 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d Modifier: Quantity: 1 : CPT4 Code: 13244010 Description: (Facility Use Only) 29581LT - [place] LWR LT LEG Modifier: 71 Quantity: 1 Physician Procedures : CPT4 Code Description Modifier [id number] - WC PHYS SUBQ TISS 20 SQ CM ICD-10 Diagnosis Description V42.595 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d Quantity: 1 Electronic  Signature(s) Signed: 07/07/2022 5:47:10 PM By: Linton Ham MD Entered By: Linton Ham on 07/07/2022 13:26:28

## 2022-07-14 ENCOUNTER — Encounter (HOSPITAL_BASED_OUTPATIENT_CLINIC_OR_DEPARTMENT_OTHER): Payer: Medicare Other | Admitting: General Surgery

## 2022-07-14 DIAGNOSIS — L97522 Non-pressure chronic ulcer of other part of left foot with fat layer exposed: Secondary | ICD-10-CM | POA: Diagnosis not present

## 2022-07-14 DIAGNOSIS — I872 Venous insufficiency (chronic) (peripheral): Secondary | ICD-10-CM | POA: Diagnosis not present

## 2022-07-14 DIAGNOSIS — Z86718 Personal history of other venous thrombosis and embolism: Secondary | ICD-10-CM | POA: Diagnosis not present

## 2022-07-14 DIAGNOSIS — I890 Lymphedema, not elsewhere classified: Secondary | ICD-10-CM | POA: Diagnosis not present

## 2022-07-14 DIAGNOSIS — L97812 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d: Secondary | ICD-10-CM | POA: Diagnosis not present

## 2022-07-14 DIAGNOSIS — L942 Calcinosis cutis: Secondary | ICD-10-CM | POA: Diagnosis not present

## 2022-07-15 NOTE — Progress Notes (Signed)
[name] ([id number]) [id number] Page 1 of 14 Visit Report for 07/14/2022 Chief Complaint Document Details Patient Name: Date of Service: [name], [name] 07/14/2022 12:30 PM Medical Record Number: [id number] Patient Account Number: [id number] Date of Birth/Sex: Treating RN: 1951-06-28 (71 y.o. M) Primary Care Provider: PA [name], NO Other Clinician: Referring Provider: Treating Provider/Extender: [name] in Treatment: 22 Information Obtained from: Patient Chief Complaint Patient seen for complaints of Non-Healing Wounds. Electronic Signature(s) Signed: 07/14/2022 1:05:04 PM By: Fredirick Maudlin MD FACS Entered By: Fredirick Maudlin on 07/14/2022 13:05:04 -------------------------------------------------------------------------------- Debridement Details Patient Name: Date of Service: [name], [name] 07/14/2022 12:30 PM Medical Record Number: [id number] Patient Account Number: [id number] Date of Birth/Sex: Treating RN: Aug 27, 1950 (71 y.o. [name] Primary Care Provider: PA [name], [place] Other Clinician: Referring Provider: Treating Provider/Extender: [name] in Treatment: 22 Debridement Performed for Assessment: Wound #1 Left,Dorsal Foot Performed By: Physician Fredirick Maudlin, MD Debridement Type: Debridement Level of Consciousness (Pre-procedure): Awake and Alert Pre-procedure Verification/Time Out Yes - 12:54 Taken: Start Time: 12:55 Pain Control: Lidocaine 5% topical ointment T Area Debrided (L x W): otal 0.5 (cm) x 0.4 (cm) = 0.2 (cm) Tissue and other material debrided: Non-Viable, Eschar, Slough, Slough Level: Non-Viable Tissue Debridement Description: Selective/Open Wound Instrument: Curette Bleeding: Minimum Hemostasis Achieved: Pressure Response to Treatment: Procedure was tolerated well Level of Consciousness (Post- Awake and Alert procedure): Post Debridement Measurements of  Total Wound Length: (cm) 0.5 Width: (cm) 0.4 Depth: (cm) 0.1 Volume: (cm) 0.016 Character of Wound/Ulcer Post Debridement: Requires Further Debridement Post Procedure Diagnosis Same as [name], [name] ([id number]) [id number] Page 2 of 14 Notes Scribed for Dr. [name] by Blanche East, RN Electronic Signature(s) Signed: 07/14/2022 4:53:29 PM By: Fredirick Maudlin MD FACS Signed: 07/15/2022 4:49:39 PM By: Blanche East RN Entered By: Blanche East on 07/14/2022 12:56:12 -------------------------------------------------------------------------------- Debridement Details Patient Name: Date of Service: [name], [name] 07/14/2022 12:30 PM Medical Record Number: [id number] Patient Account Number: [id number] Date of Birth/Sex: Treating RN: [birth year] (71 y.o. [name] Primary Care Provider: PA [name], [place] Other Clinician: Referring Provider: Treating Provider/Extender: [name] in Treatment: 22 Debridement Performed for Assessment: Wound #3 Right,Medial Lower Leg Performed By: Physician Fredirick Maudlin, MD Debridement Type: Debridement Level of Consciousness (Pre-procedure): Awake and Alert Pre-procedure Verification/Time Out Yes - 12:54 Taken: Start Time: 12:55 Pain Control: Lidocaine 5% topical ointment T Area Debrided (L x W): otal 3 (cm) x 5.2 (cm) = 15.6 (cm) Tissue and other material debrided: Viable, Non-Viable, Slough, Subcutaneous, Slough Level: Skin/Subcutaneous Tissue Debridement Description: Excisional Instrument: Curette Bleeding: Minimum Hemostasis Achieved: Pressure Response to Treatment: Procedure was tolerated well Level of Consciousness (Post- Awake and Alert procedure): Post Debridement Measurements of Total Wound Length: (cm) 3 Width: (cm) 5.2 Depth: (cm) 0.6 Volume: (cm) 7.351 Character of Wound/Ulcer Post Debridement: Requires Further Debridement Post Procedure Diagnosis Same as  Pre-procedure Notes Scribed for Dr. [name] by Blanche East, RN Electronic Signature(s) Signed: 07/14/2022 4:53:29 PM By: Fredirick Maudlin MD FACS Signed: 07/15/2022 4:49:39 PM By: Blanche East RN Entered By: Blanche East on 07/14/2022 13:02:21 -------------------------------------------------------------------------------- HPI Details Patient Name: Date of Service: [name], [name] 07/14/2022 12:30 PM Medical Record Number: [id number] Patient Account Number: [id number] Date of Birth/Sex: Treating RN: Apr 02, 1951 (71 y.o. M) Primary Care Provider: PA Darnelle Spangle Other ClinicianVERN, [name] ([id number]) [id number] Page 3 of 14 Referring Provider: Treating Provider/Extender: [name] in Treatment: 22 History of Present Illness HPI Description: ADMISSION 02/09/2022 This  is a 71 year old man who recently moved to [place] from [place]. He has a history of of coronary artery disease and prior left popliteal vein DVT . He is not diabetic and does not smoke. 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 He has had multiple spinal surgeries and has limited mobility. He primarily uses a motorized wheelchair. He has had lymphedema for many years and does have lymphedema pumps although he says he does not use them frequently. He has wounds on his bilateral lower extremities. He was receiving care in [place] for these prior to his move. They were apparently packing his wounds with Betadine soaked gauze. He has worn compression wraps in the past but not recently. He did say that they helped tremendously. In clinic today, his right ABI is noncompressible but has good signals; the left ABI was 1.17. No formal vascular studies have been performed. On his left dorsal foot, there is a circular lesion that exposes the fat layer. There is fairly heavy slough accumulation within the wound, but no significant odor. On his right medial calf, he  has multiple pinholes that have slough buildup in them and probe for about 2 to 4 mm in depth. They are draining clear fluid. On his right lateral midfoot, he has ulceration consistent with venous stasis. It is geographic and has slough accumulation. 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 02/17/2022: All of the wounds are little bit larger today and have more slough accumulation. Today, the medial calf openings are larger and probe to something that feels calcified. There is odor coming from his wounds. His vascular studies are scheduled for August 9 and 10. 02-24-2022 upon evaluation today patient presents for follow-up concerning his ongoing issues here with his lower extremities. With that being said he does have significant problems with what appears to be cellulitis unfortunately the PCR culture that was sent last week got crushed by [employer] in transit. With that being said we are going to reobtain a culture today although I am actually to do it on the right medial leg as this seems to be the most inflamed area today where there is some questionable drainage as well. I am going to remove some of the calcium deposits which are loosening obtain a deeper culture from this area. Fortunately I do not see any evidence of active infection systemically which is good news but locally I think we are having a bigger issue here. He does have his appointment next Thursday with vascular. Patient has also been referred to [facility] for further evaluation and treatment of the calcinosis for possible sulfate injections. 03/04/2022: The culture that was performed last week grew out multiple species including Klebsiella, Morganella, and Pseudomonas aeruginosa. He had been prescribed Bactrim but this was not adequate to cover all of the species and levofloxacin was recommended. He completed the Bactrim but did not pick up  the levofloxacin and begin taking it until yesterday. 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 He had arterial studies performed today. He does have evidence of peripheral arterial disease. Results copied here: +-------+-----------+-----------+------------+------------+ ABI/TBIT oday's ABIT oday's TBIPrevious ABIPrevious TBI +-------+-----------+-----------+------------+------------+ Right Ray 0.75    +-------+-----------+-----------+------------+------------+ Left [place] 0.57    +-------+-----------+-----------+------------+------------+ Arterial wall calcification precludes accurate ankle pressures and ABIs.  Summary: Right: Resting right ankle-brachial index indicates noncompressible right lower extremity arteries. The right toe-brachial index is normal. Left: Resting left ankle-brachial index indicates noncompressible left lower extremity arteries. The left toe-brachial index is abnormal. He continues to have further tissue breakdown at the right medial calf and there is ample slough accumulation along with necrotic subcutaneous tissue. The left dorsal foot wound has built up slough, as well. The right medial foot wound is nearly closed with just a light layer of eschar over the surface. The right dorsal lateral foot wound has also accumulated a fair amount of slough and remains quite tender. 03/10/2022: He has been taking the prescribed levofloxacin and has about 3 days left of this. The erythema and induration on his right leg has improved. He continues to experience further breakdown of the right medial calf wound. There is extensive slough and debris accumulation there. There is heavy slough on his left dorsal foot wound, but no odor or purulent drainage. The right medial foot wound appears to be closed. The  right dorsal lateral foot wound also has a fair amount of slough but it is less tender than at our last visit. He still does not have an appointment with dermatology. 03/22/2022: The right anterior tibial wound has closed. The right lateral foot wound is nearly closed with just a little bit of slough. The left dorsal foot wound is smaller and continues to have some slough buildup but less so than at prior visits. There is good granulation tissue underlying the slough. Unfortunately the right medial calf wound continues to deteriorate. It has a foul odor today and a lot of necrotic tissue, the surrounding skin is also erythematous and moist. 03/30/2022: The right lateral foot wound continues to contract and just has a little bit of slough and eschar present. The left dorsal foot wound is also smaller with slough overlying good granulation tissue. The right medial calf wound actually looks quite a bit better today; there is no odor and there is much less necrotic tissue although some remains present. We have been using topical gentamicin and he has been taking oral Augmentin. 04/07/2022: The patient saw dermatology at [facility] last week. Unfortunately, it appears that they did not read any of the documentation that was provided. 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 Overall, it was a highly unsatisfactory consultation. In the meantime, however, we were able to identify compounding pharmacy who could create a topical sodium thiosulfate ointment. The patient has that with him today. Both of the right foot wounds are now closed. The left dorsal foot wound has contracted but still has a layer of slough on the surface. The medial calf  wounds on the right all look a little bit better. They still have heavy slough along with the chunks of calcium. Everything is stained purple. 04/14/2022: The wound on his dorsal left foot is a little bit smaller again today. There is still slough accumulation on the surface. The medial calf wounds have quite a bit less slough accumulation today. His right leg, however, is warm and erythematous, concerning for cellulitis. 04/14/2022: He completed his course of Augmentin yesterday. The medial calf wound sites are much cleaner today and shallower. There is less fragmented calcium in the wounds. He  has a lot of lymphedema fluid-like drainage from these wounds, but no purulent discharge or malodor. The wound on his dorsal left foot has also contracted and is shallower. There is a layer of slough over healthy granulation tissue. MIHAIL, PRETTYMAN ([id number]) [id number] Page 4 of 14 05/05/2022: The left dorsal foot wound is smaller today with less slough and more granulation tissue. The right medial calf wounds are shallower, but have extensive slough and some fat necrosis. The drainage has diminished considerably, however. 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 05/12/2022: The left dorsal foot wound continues to contract and fill with granulation tissue. There is slough on the wound surface. The right medial calf wounds also continue to fill with healthy tissue, but there is remaining slough and fat necrosis present. He had a significant increase in his drainage this week and it was a bright yellow-green color. He also had a new wound open over his right anterior tibial surface associated with the spike of cutaneous calcium. The leg is more red, as well, concerning for infection. 05/19/2022: His culture returned positive for Pseudomonas. He is currently taking a  course of oral levofloxacin. We have also been using topical gentamicin mixed in with his sodium thiosulfate. All of the wounds look better this week. 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 There is slough and nonviable subcutaneous tissue still present. The new anterior tibial wound just has a light layer of slough. The dorsal foot wound also has some slough present and appears a little dry today. 05/26/2022: The right medial leg wound continues to improve. It is feeling with granulation tissue, but still accumulates a fairly thick layer of slough on the surface. The more recent anterior tibial wound has remained quite small, but also has some slough on the surface. The left dorsal foot wound has contracted somewhat and has perimeter epithelialization. He completed his oral levofloxacin. 06/02/2022: The left dorsal foot wound continues to improve significantly. There is just a little slough on the wound surface. The right medial leg wound had black drainage, but it looks like silver alginate was applied last week and I theorized that silver sulfide was created with a combination of the silver alginate and the sodium thiosulfate. It did, however, have a bit of an odor to it and extensive slough accumulation. The small anterior tibial wound also had a bit of slough and nonviable subcutaneous tissue present. The skin of his leg was rather macerated, as well. 06/09/2022: The left dorsal foot wound is smaller again this week with just a light layer of slough on the surface. The right medial leg wound looks substantially better. The leg and periwound skin are no longer red and macerated. There is good granulation tissue forming with less slough and nonviable tissue. The anterior tibial wound just has a small amount of slough accumulation. 06/16/2022: The left dorsal foot wound continues to contract. His wrap slipped a little bit, however, and his foot is more swollen. The  right medial leg wound continues to improve. The underlying tissue is more vital-appearing and there is less slough. He does have substantial drainage. The small anterior tibial wound has a bit of slough accumulation. 06/23/2022: The left dorsal foot wound is about half the size as it was last week. There is just some light slough on the surface and some eschar buildup around the edges. The  right anterior leg wound is small with a little slough on the surface. Unfortunately, the right medial leg wound deteriorated fairly substantially. It is malodorous with gray/green/black discoloration. 06/30/2022: The left dorsal foot wound continues to contract. It is nearly closed with just a light layer of slough present. The right anterior leg wound is about the same size and has a small amount of slough on the surface. The right medial leg wound looks a lot better than it did last week. The odor has abated and the tissue is less pulpy and necrotic-looking. There is still a fairly thick layer of slough present. 12/13; the left dorsal foot wound looks very healthy. There was no need to change the dressing here and no debridement. He has a small area on the right anterior lower leg apparently had not changed much in size. The area on the medial leg is the most problematic area this is large with some depth and a completely nonviable surface today. 07/14/2022: The left dorsal foot wound is smaller today with just a little slough and eschar present. The right anterior lower leg wound is about the same without any significant change. The medial leg wound has a black and green surface and is malodorous. No purulent drainage and the periwound skin is intact. Edema control is suboptimal. Electronic Signature(s) Signed: 07/14/2022 1:06:06 PM By: Fredirick Maudlin MD FACS Entered By: Fredirick Maudlin on 07/14/2022 13:06:06 -------------------------------------------------------------------------------- Physical Exam  Details Patient Name: Date of Service: [name], [name] 07/14/2022 12:30 PM Medical Record Number: [id number] Patient Account Number: [id number] Date of Birth/Sex: Treating RN: [birth year] (71 y.o. M) Primary Care Provider: PA [name], NO Other Clinician: Referring Provider: Treating Provider/Extender: [name], [name] in Treatment: 22 Constitutional . . . . No acute distress. Respiratory Normal work of breathing on room air. Notes 07/14/2022: The left dorsal foot wound is smaller today with just a little slough and eschar present. The right anterior lower leg wound is about the same without any significant change. The medial leg wound has a black and green surface and is malodorous. No purulent drainage and the periwound skin is intact. Edema control is suboptimal. Electronic Signature(s) Signed: 07/14/2022 1:06:46 PM By: Fredirick Maudlin MD FACS Entered By: Fredirick Maudlin on 07/14/2022 13:06:46 [name] ([id number]) [id number] Page 5 of 14 -------------------------------------------------------------------------------- Physician Orders Details Patient Name: Date of Service: [name], [name] 07/14/2022 12:30 PM Medical Record Number: [id number] Patient Account Number: [id number] Date of Birth/Sex: Treating RN: 01/14/1951 (71 y.o. [name] Primary Care Provider: PA [name], [place] Other Clinician: Referring Provider: Treating Provider/Extender: [name] in Treatment: 22 Verbal / Phone Orders: No Diagnosis Coding ICD-10 Coding Code Description [phone] 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d L97.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) I10 Essential (primary) hypertension I25.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 angina  pectoris I89.0 Lymphedema, not elsewhere classified Z86.718 Personal history of other venous thrombosis and embolism Follow-up Appointments ppointment in 1 week. - Dr. [name] Rm 1 Return A ppointment in 2 weeks. - Dr. [name] Room 1 Return A Anesthetic Wound #1 Left,Dorsal Foot (In clinic) Topical Lidocaine 4% applied to wound bed Wound #3 Right,Medial Lower Leg (In clinic) Topical Lidocaine 4% applied to wound bed Wound #6 Right,Anterior Lower Leg (In clinic) Topical Lidocaine 4% applied to wound bed Bathing/ Shower/ Hygiene May shower with protection  but do not get wound dressing(s) wet. Edema Control - Lymphedema / SCD / Other Lymphedema Pumps. Use Lymphedema pumps on leg(s) 2-3 times a day for 45-60 minutes. If wearing any wraps or hose, do not remove them. Continue exercising as instructed. - use 1-2 times per day over wraps Elevate legs to the level of the heart or above for 30 minutes daily and/or when sitting, a frequency of: Exercise regularly Wound Treatment Wound #1 - Foot Wound Laterality: Dorsal, Left Peri-Wound Care: Sween Lotion (Moisturizing lotion) 1 x Per Week/30 Days Discharge Instructions: Apply moisturizing lotion as directed Prim Dressing: Promogran Prisma Matrix, 4.34 (sq in) (silver collagen) 1 x Per Week/30 Days ary Discharge Instructions: Moisten collagen with saline or hydrogel Secondary Dressing: Woven Gauze Sponge, Non-Sterile 4x4 in 1 x Per Week/30 Days Discharge Instructions: Apply over primary dressing as directed. Compression Wrap: ThreePress (3 layer compression wrap) 1 x Per Week/30 Days Discharge Instructions: Apply three layer compression as directed. Wound #3 - Lower Leg Wound Laterality: Right, Medial Peri-Wound Care: Sween Lotion (Moisturizing lotion) 1 x Per Week/30 Days Discharge Instructions: Apply moisturizing lotion as directed Topical: Gentamicin 1 x Per Week/30 Days Discharge Instructions: As directed by physician [name]  ([id number]) [id number] Page 6 of 14 Prim Dressing: Santyl Ointment 1 x Per Week/30 Days ary Discharge Instructions: Apply nickel thick amount to wound bed as instructed Secondary Dressing: ABD Pad, 8x10 1 x Per Week/30 Days Discharge Instructions: Apply over primary dressing as directed. Secondary Dressing: Woven Gauze Sponge, Non-Sterile 4x4 in 1 x Per Week/30 Days Discharge Instructions: Apply over primary dressing as directed. Secondary Dressing: Zetuvit Plus 4x8 in 1 x Per Week/30 Days Discharge Instructions: Apply over primary dressing as directed. Compression Wrap: ThreePress (3 layer compression wrap) 1 x Per Week/30 Days Discharge Instructions: Apply three layer compression as directed. Wound #6 - Lower Leg Wound Laterality: Right, Anterior Peri-Wound Care: Sween Lotion (Moisturizing lotion) 1 x Per Week/30 Days Discharge Instructions: Apply moisturizing lotion as directed Prim Dressing: Promogran Prisma Matrix, 4.34 (sq in) (silver collagen) 1 x Per Week/30 Days ary Discharge Instructions: Moisten collagen with saline or hydrogel Secondary Dressing: ABD Pad, 8x10 1 x Per Week/30 Days Discharge Instructions: Apply over primary dressing as directed. Secondary Dressing: Woven Gauze Sponge, Non-Sterile 4x4 in 1 x Per Week/30 Days Discharge Instructions: Apply over primary dressing as directed. Secondary Dressing: Zetuvit Plus 4x8 in 1 x Per Week/30 Days Discharge Instructions: Apply over primary dressing as directed. Compression Wrap: ThreePress (3 layer compression wrap) 1 x Per Week/30 Days Discharge Instructions: Apply three layer compression as directed. Electronic Signature(s) Signed: 07/14/2022 4:53:29 PM By: Fredirick Maudlin MD FACS Entered By: Fredirick Maudlin on 07/14/2022 13:07:03 -------------------------------------------------------------------------------- Problem List Details Patient Name: Date of Service: [name], [name] 07/14/2022 12:30  PM Medical Record Number: [id number] Patient Account Number: [id number] Date of Birth/Sex: Treating RN: October 28, 1950 (71 y.o. M) Primary Care Provider: PA [name], NO Other Clinician: Referring Provider: Treating Provider/Extender: [name] in Treatment: 22 Active Problems ICD-10 Encounter Code Description Active Date MDM Diagnosis L97.812 Non-pressure chronic ulcer of other part of right lower leg with fat layer 02/09/2022 No Yes exposed L97.522 Non-pressure chronic ulcer of other part of left foot with fat layer exposed 02/09/2022 No Yes 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 02/09/2022 No Yes JASYAH, THEURER ([id number]) [id number] Page 7 of 14 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) 02/09/2022 No Yes I10 Essential (primary) hypertension 02/09/2022 No Yes I25.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 02/09/2022  No Yes I89.0 Lymphedema, not elsewhere classified 02/09/2022 No Yes Z86.718 Personal history of other venous thrombosis and embolism 02/09/2022 No Yes Inactive Problems ICD-10 Code Description Active Date Inactive Date L97.512 Non-pressure chronic ulcer of other part of right foot with fat layer exposed 02/17/2022 02/17/2022 Resolved Problems Electronic Signature(s) Signed: 07/14/2022 1:04:44 PM By: Fredirick Maudlin MD FACS Entered By: Fredirick Maudlin on 07/14/2022 13:04:44 -------------------------------------------------------------------------------- Progress Note Details Patient Name: Date of Service: [name] 07/14/2022 12:30 PM Medical Record Number: [id number] Patient Account Number: [id number] Date of Birth/Sex: Treating RN: 06/16/1951 (71 y.o. M) Primary Care Provider: PA [name], NO Other Clinician: Referring Provider: Treating Provider/Extender: [name] in Treatment: 22 Subjective Chief Complaint Information obtained from Patient Patient seen for  complaints of Non-Healing Wounds. History of Present Illness (HPI) ADMISSION 02/09/2022 This is a 71 year old man who recently moved to [place] from [place]. He has a history of of coronary artery disease and prior left popliteal vein DVT . He is not diabetic and does not smoke. 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 He has had multiple spinal surgeries and has limited mobility. He primarily uses a motorized wheelchair. He has had lymphedema for many years and does have lymphedema pumps although he says he does not use them frequently. He has wounds on his bilateral lower extremities. He was receiving care in [place] for these prior to his move. They were apparently packing his wounds with Betadine soaked gauze. He has worn compression wraps in the past but not recently. He did say that they helped tremendously. In clinic today, his right ABI is noncompressible but has good signals; the left ABI was 1.17. No formal vascular studies have been performed. On his left dorsal foot, there is a circular lesion that exposes the fat layer. There is fairly heavy slough accumulation within the wound, but no significant odor. On his right medial calf, he has multiple pinholes that have slough buildup in them and probe for about 2 to 4 mm in depth. They are draining clear fluid. On his right lateral midfoot, he has ulceration consistent with venous stasis. It is geographic and has slough accumulation. 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 02/17/2022: All of the wounds are little bit larger today and have more slough accumulation. Today, the medial calf openings are larger and probe to something that feels calcified. There is odor coming from his wounds. His vascular studies are scheduled for August 9 and 10. AESON, SAWYERS ([id number]) [id number] Page 8 of 14 02-24-2022 upon  evaluation today patient presents for follow-up concerning his ongoing issues here with his lower extremities. With that being said he does have significant problems with what appears to be cellulitis unfortunately the PCR culture that was sent last week got crushed by [employer] in transit. With that being said we are going to reobtain a culture today although I am actually to do it on the right medial leg as this seems to be the most inflamed area today where there is some questionable drainage as well. I am going to remove some of the calcium deposits which are loosening obtain a deeper culture from this area. Fortunately I do not see any evidence of active infection systemically which is good news but locally I think we are having a bigger issue here. He does have his appointment next Thursday with vascular. Patient has also been referred to  [place] for further evaluation and treatment of the calcinosis for possible sulfate injections. 03/04/2022: The culture that was performed last week grew out multiple species including Klebsiella, Morganella, and Pseudomonas aeruginosa. He had been prescribed Bactrim but this was not adequate to cover all of the species and levofloxacin was recommended. He completed the Bactrim but did not pick up the levofloxacin and begin taking it until yesterday. We referred him to dermatology at 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 He had arterial studies performed today. He does have evidence of peripheral arterial disease. Results copied here: +-------+-----------+-----------+------------+------------+ ABI/TBIT oday's ABIT oday's TBIPrevious ABIPrevious TBI +-------+-----------+-----------+------------+------------+ Right [place] 0.75    +-------+-----------+-----------+------------+------------+ Left [place] 0.57     +-------+-----------+-----------+------------+------------+ Arterial wall calcification precludes accurate ankle pressures and ABIs. Summary: Right: Resting right ankle-brachial index indicates noncompressible right lower extremity arteries. The right toe-brachial index is normal. Left: Resting left ankle-brachial index indicates noncompressible left lower extremity arteries. The left toe-brachial index is abnormal. He continues to have further tissue breakdown at the right medial calf and there is ample slough accumulation along with necrotic subcutaneous tissue. The left dorsal foot wound has built up slough, as well. The right medial foot wound is nearly closed with just a light layer of eschar over the surface. The right dorsal lateral foot wound has also accumulated a fair amount of slough and remains quite tender. 03/10/2022: He has been taking the prescribed levofloxacin and has about 3 days left of this. The erythema and induration on his right leg has improved. He continues to experience further breakdown of the right medial calf wound. There is extensive slough and debris accumulation there. There is heavy slough on his left dorsal foot wound, but no odor or purulent drainage. The right medial foot wound appears to be closed. The right dorsal lateral foot wound also has a fair amount of slough but it is less tender than at our last visit. He still does not have an appointment with dermatology. 03/22/2022: The right anterior tibial wound has closed. The right lateral foot wound is nearly closed with just a little bit of slough. The left dorsal foot wound is smaller and continues to have some slough buildup but less so than at prior visits. There is good granulation tissue underlying the slough. Unfortunately the right medial calf wound continues to deteriorate. It has a foul odor today and a lot of necrotic tissue, the surrounding skin is also erythematous and moist. 03/30/2022: The right  lateral foot wound continues to contract and just has a little bit of slough and eschar present. The left dorsal foot wound is also smaller with slough overlying good granulation tissue. The right medial calf wound actually looks quite a bit better today; there is no odor and there is much less necrotic tissue although some remains present. We have been using topical gentamicin and he has been taking oral Augmentin. 04/07/2022: The patient saw dermatology at [facility] last week. Unfortunately, it appears that they did not read any of the documentation that was provided. 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 Overall, it was a highly unsatisfactory consultation. In the meantime, however, we  were able to identify compounding pharmacy who could create a topical sodium thiosulfate ointment. The patient has that with him today. Both of the right foot wounds are now closed. The left dorsal foot wound has contracted but still has a layer of slough on the surface. The medial calf wounds on the right all look a little bit better. They still have heavy slough along with the chunks of calcium. Everything is stained purple. 04/14/2022: The wound on his dorsal left foot is a little bit smaller again today. There is still slough accumulation on the surface. The medial calf wounds have quite a bit less slough accumulation today. His right leg, however, is warm and erythematous, concerning for cellulitis. 04/14/2022: He completed his course of Augmentin yesterday. The medial calf wound sites are much cleaner today and shallower. There is less fragmented calcium in the wounds. He has a lot of lymphedema fluid-like drainage from these wounds, but no  purulent discharge or malodor. The wound on his dorsal left foot has also contracted and is shallower. There is a layer of slough over healthy granulation tissue. 05/05/2022: The left dorsal foot wound is smaller today with less slough and more granulation tissue. The right medial calf wounds are shallower, but have extensive slough and some fat necrosis. The drainage has diminished considerably, however. 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 05/12/2022: The left dorsal foot wound continues to contract and fill with granulation tissue. There is slough on the wound surface. The right medial calf wounds also continue to fill with healthy tissue, but there is remaining slough and fat necrosis present. He had a significant increase in his drainage this week and it was a bright yellow-green color. He also had a new wound open over his right anterior tibial surface associated with the spike of cutaneous calcium. The leg is more red, as well, concerning for infection. 05/19/2022: His culture returned positive for Pseudomonas. He is currently taking a course of oral levofloxacin. We have also been using topical gentamicin mixed in with his sodium thiosulfate. All of the wounds look better this week. 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 There is slough and nonviable subcutaneous tissue still present. The new anterior tibial wound just has a light layer of slough. The dorsal foot wound also has some slough present and appears a little dry today. 05/26/2022: The right medial leg wound continues to improve. It is feeling with granulation tissue, but still accumulates a fairly thick layer of slough on the surface. The more recent anterior tibial wound has remained quite small, but also has some slough on the surface. The left dorsal foot wound has  contracted somewhat and has perimeter epithelialization. He completed his oral levofloxacin. 06/02/2022: The left dorsal foot wound continues to improve significantly. There is just a little slough on the wound surface. The right medial leg wound had black drainage, but it looks like silver alginate was applied last week and I theorized that silver sulfide was created with a combination of the silver alginate and the sodium thiosulfate. It did, however, have a bit of an odor to it and extensive slough accumulation. The small anterior tibial wound also had a bit of slough and nonviable subcutaneous tissue present. The skin of his leg was rather macerated, as well. 06/09/2022: The left dorsal foot wound is smaller again this week with just  a light layer of slough on the surface. The right medial leg wound looks substantially better. The leg and periwound skin are no longer red and macerated. There is good granulation tissue forming with less slough and nonviable tissue. The anterior [name], [name] ([id number]) [id number] Page 9 of 14 tibial wound just has a small amount of slough accumulation. 06/16/2022: The left dorsal foot wound continues to contract. His wrap slipped a little bit, however, and his foot is more swollen. The right medial leg wound continues to improve. The underlying tissue is more vital-appearing and there is less slough. He does have substantial drainage. The small anterior tibial wound has a bit of slough accumulation. 06/23/2022: The left dorsal foot wound is about half the size as it was last week. There is just some light slough on the surface and some eschar buildup around the edges. The right anterior leg wound is small with a little slough on the surface. Unfortunately, the right medial leg wound deteriorated fairly substantially. It is malodorous with gray/green/black discoloration. 06/30/2022: The left dorsal foot wound continues to contract. It is  nearly closed with just a light layer of slough present. The right anterior leg wound is about the same size and has a small amount of slough on the surface. The right medial leg wound looks a lot better than it did last week. The odor has abated and the tissue is less pulpy and necrotic-looking. There is still a fairly thick layer of slough present. 12/13; the left dorsal foot wound looks very healthy. There was no need to change the dressing here and no debridement. He has a small area on the right anterior lower leg apparently had not changed much in size. The area on the medial leg is the most problematic area this is large with some depth and a completely nonviable surface today. 07/14/2022: The left dorsal foot wound is smaller today with just a little slough and eschar present. The right anterior lower leg wound is about the same without any significant change. The medial leg wound has a black and green surface and is malodorous. No purulent drainage and the periwound skin is intact. Edema control is suboptimal. Patient History Information obtained from Patient, Chart. Family History Cancer - Mother, Heart Disease - Father,Mother, Hypertension - Father,Siblings, Lung Disease - Siblings, No family history of Diabetes, Hereditary Spherocytosis, Kidney Disease, Seizures, Stroke, Thyroid Problems, Tuberculosis. Social History Former smoker - quit 1991, Marital Status - Married, Alcohol Use - Moderate, Drug Use - No History, Caffeine Use - Daily - coffee. Medical History Eyes Patient has history of Cataracts - right eye removed Denies history of Glaucoma, Optic Neuritis Ear/Nose/Mouth/Throat Denies history of Chronic sinus problems/congestion, Middle ear problems Hematologic/Lymphatic Patient has history of Anemia - macrocytic, Lymphedema Cardiovascular Patient has history of Congestive Heart Failure, Deep Vein Thrombosis - left leg, Hypertension, Peripheral Venous  Disease Endocrine Denies history of Type I Diabetes, Type II Diabetes Genitourinary Denies history of End Stage Renal Disease Integumentary (Skin) Denies history of History of Burn Oncologic Denies history of Received Chemotherapy, Received Radiation Psychiatric Denies history of Anorexia/bulimia, Confinement Anxiety Hospitalization/Surgery History - cervical fusion. - lumbar surgery. - coronary stent placement. - lasik eye surgery. - hydrocele excision. Medical A Surgical History Notes nd Constitutional Symptoms (General Health) obesity Ear/Nose/Mouth/Throat hard of hearing Respiratory frozen diaphragm right Cardiovascular hyperlipidemia Musculoskeletal myositis, lumbar DDD, spinal stenosis, cervical facet joint syndrome Objective Constitutional No acute distress. Vitals Time Taken: 12:40 PM, Height: 71 in, Weight: 267 lbs,  BMI: 37.2, Temperature: 98.2 F, Pulse: 76 bpm, Respiratory Rate: 16 breaths/min, Blood Pressure: 128/77 mmHg. Respiratory Normal work of breathing on room air. GREGOR, DERSHEM ([id number]) [id number] Page 10 of 14 General Notes: 07/14/2022: The left dorsal foot wound is smaller today with just a little slough and eschar present. The right anterior lower leg wound is about the same without any significant change. The medial leg wound has a black and green surface and is malodorous. No purulent drainage and the periwound skin is intact. Edema control is suboptimal. Integumentary (Hair, Skin) Wound #1 status is Open. Original cause of wound was Gradually Appeared. The date acquired was: 12/07/2021. The wound has been in treatment 22 weeks. The wound is located on the Left,Dorsal Foot. The wound measures 0.5cm length x 0.4cm width x 0.1cm depth; 0.157cm^2 area and 0.016cm^3 volume. There is Fat Layer (Subcutaneous Tissue) exposed. There is no tunneling or undermining noted. There is a medium amount of serosanguineous drainage noted.  The wound margin is distinct with the outline attached to the wound base. There is medium (34-66%) pink granulation within the wound bed. There is a medium (34- 66%) amount of necrotic tissue within the wound bed. The periwound skin appearance had no abnormalities noted for texture. The periwound skin appearance had no abnormalities noted for moisture. The periwound skin appearance exhibited: Hemosiderin Staining. The periwound skin appearance did not exhibit: Atrophie Blanche, Cyanosis, Ecchymosis, Mottled, Pallor, Rubor, Erythema. Periwound temperature was noted as No Abnormality. The periwound has tenderness on palpation. Wound #1 status is Open. Original cause of wound was Gradually Appeared. The date acquired was: 12/07/2021. The wound has been in treatment 22 weeks. The wound is located on the Left,Dorsal Foot. The wound measures 0.5cm length x 0.4cm width x 0.1cm depth; 0.157cm^2 area and 0.016cm^3 volume. There is a medium amount of serosanguineous drainage noted. Wound #3 status is Open. Original cause of wound was Gradually Appeared. The date acquired was: 12/07/2021. The wound has been in treatment 22 weeks. The wound is located on the Right,Medial Lower Leg. The wound measures 3cm length x 5.2cm width x 0.6cm depth; 12.252cm^2 area and 7.351cm^3 volume. There is Fat Layer (Subcutaneous Tissue) exposed. There is no tunneling or undermining noted. There is a large amount of serosanguineous drainage noted. Foul odor after cleansing was noted. The wound margin is distinct with the outline attached to the wound base. There is small (1-33%) friable, hyper - granulation within the wound bed. There is a large (67-100%) amount of necrotic tissue within the wound bed including Eschar and Adherent Slough. The periwound skin appearance had no abnormalities noted for texture. The periwound skin appearance had no abnormalities noted for moisture. The periwound skin appearance exhibited: Hemosiderin  Staining. The periwound skin appearance did not exhibit: Erythema. Periwound temperature was noted as No Abnormality. The periwound has tenderness on palpation. Wound #6 status is Open. Original cause of wound was Gradually Appeared. The date acquired was: 05/12/2022. The wound has been in treatment 9 weeks. The wound is located on the Right,Anterior Lower Leg. The wound measures 0.4cm length x 0.2cm width x 0.3cm depth; 0.063cm^2 area and 0.019cm^3 volume. There is Fat Layer (Subcutaneous Tissue) exposed. There is no undermining noted, however, there is tunneling at 1:00 with a maximum distance of 0.6cm. There is a medium amount of serosanguineous drainage noted. The wound margin is epibole. There is large (67-100%) red granulation within the wound bed. There is a small (1-33%) amount of necrotic tissue within the wound bed including  Adherent Slough. The periwound skin appearance had no abnormalities noted for texture. The periwound skin appearance had no abnormalities noted for moisture. The periwound skin appearance exhibited: Hemosiderin Staining. The periwound skin appearance did not exhibit: Atrophie Blanche, Cyanosis, Ecchymosis, Mottled, Pallor, Rubor, Erythema. Periwound temperature was noted as No Abnormality. The periwound has tenderness on palpation. 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 There was a Selective/Open Wound Non-Viable Tissue Debridement with a  total area of 0.2 sq cm performed by Fredirick Maudlin, MD. With the following instrument(s): Curette to remove Non-Viable tissue/material. Material removed includes Eschar and Slough and after achieving pain control using Lidocaine 5% topical ointment. No specimens were taken. A time out was conducted at 12:54, prior to the start of the procedure. A Minimum amount of bleeding was controlled with Pressure. The procedure was tolerated well. Post Debridement Measurements: 0.5cm length x 0.4cm width x 0.1cm depth; 0.016cm^3 volume. Character of Wound/Ulcer Post Debridement requires further debridement. Post procedure Diagnosis Wound #1: Same as Pre-Procedure General Notes: Scribed for Dr. [name] by Blanche East, RN. Pre-procedure diagnosis of Wound #1 is a Lymphedema located on the Left,Dorsal Foot . There was a Three Layer Compression Therapy Procedure by Blanche East, RN. Post procedure Diagnosis Wound #1: Same as Pre-Procedure Wound #3 Pre-procedure diagnosis of Wound #3 is a Lymphedema located on the Right,Medial Lower Leg . There was a Excisional Skin/Subcutaneous Tissue Debridement with a total area of 15.6 sq cm performed by Fredirick Maudlin, MD. With the following instrument(s): Curette to remove Viable and Non-Viable tissue/material. Material removed includes Subcutaneous Tissue and Slough and after achieving pain control using Lidocaine 5% topical ointment. No specimens were taken. A time out was conducted at 12:54, prior to the start of the procedure. A Minimum amount of bleeding was controlled with Pressure. The procedure was tolerated well. Post Debridement Measurements: 3cm length x 5.2cm width x 0.6cm depth; 7.351cm^3 volume. Character of Wound/Ulcer Post Debridement requires further debridement. Post procedure Diagnosis Wound #3: Same as Pre-Procedure General Notes: Scribed for Dr. [name] by Blanche East, RN. Pre-procedure diagnosis of Wound #3 is a Lymphedema located on the  Right,Medial Lower Leg . There was a Three Layer Compression Therapy Procedure by Blanche East, RN. Post procedure Diagnosis Wound #3: Same as Pre-Procedure JAYMOND, WAAGE ([id number]) [id number] Page 11 of 14 Wound #6 Pre-procedure diagnosis of Wound #6 is a Calcinosis located on the Right,Anterior Lower Leg . There was a Three Layer Compression Therapy Procedure by Blanche East, RN. Post procedure Diagnosis Wound #6: Same as Pre-Procedure Plan Follow-up Appointments: Return Appointment in 1 week. - Dr. [name] Rm 1 Return Appointment in 2 weeks. - Dr. [name] Room 1 Anesthetic: Wound #1 Left,Dorsal Foot: (In clinic) Topical Lidocaine 4% applied to wound bed Wound #3 Right,Medial Lower Leg: (In clinic) Topical Lidocaine 4% applied to wound bed Wound #6 Right,Anterior Lower Leg: (In clinic) Topical Lidocaine 4% applied to wound bed Bathing/ Shower/ Hygiene: May shower with protection but do not get wound dressing(s) wet. Edema Control - Lymphedema / SCD / Other: Lymphedema Pumps. Use Lymphedema pumps on leg(s) 2-3  times a day for 45-60 minutes. If wearing any wraps or hose, do not remove them. Continue exercising as instructed. - use 1-2 times per day over wraps Elevate legs to the level of the heart or above for 30 minutes daily and/or when sitting, a frequency of: Exercise regularly WOUND #1: - Foot Wound Laterality: Dorsal, Left Peri-Wound Care: Sween Lotion (Moisturizing lotion) 1 x Per Week/30 Days Discharge Instructions: Apply moisturizing lotion as directed Prim Dressing: Promogran Prisma Matrix, 4.34 (sq in) (silver collagen) 1 x Per Week/30 Days ary Discharge Instructions: Moisten collagen with saline or hydrogel Secondary Dressing: Woven Gauze Sponge, Non-Sterile 4x4 in 1 x Per Week/30 Days Discharge Instructions: Apply over primary dressing as directed. Com pression Wrap: ThreePress (3 layer compression wrap) 1 x Per Week/30 Days Discharge  Instructions: Apply three layer compression as directed. WOUND #3: - Lower Leg Wound Laterality: Right, Medial Peri-Wound Care: Sween Lotion (Moisturizing lotion) 1 x Per Week/30 Days Discharge Instructions: Apply moisturizing lotion as directed Topical: Gentamicin 1 x Per Week/30 Days Discharge Instructions: As directed by physician Prim Dressing: Santyl Ointment 1 x Per Week/30 Days ary Discharge Instructions: Apply nickel thick amount to wound bed as instructed Secondary Dressing: ABD Pad, 8x10 1 x Per Week/30 Days Discharge Instructions: Apply over primary dressing as directed. Secondary Dressing: Woven Gauze Sponge, Non-Sterile 4x4 in 1 x Per Week/30 Days Discharge Instructions: Apply over primary dressing as directed. Secondary Dressing: Zetuvit Plus 4x8 in 1 x Per Week/30 Days Discharge Instructions: Apply over primary dressing as directed. Com pression Wrap: ThreePress (3 layer compression wrap) 1 x Per Week/30 Days Discharge Instructions: Apply three layer compression as directed. WOUND #6: - Lower Leg Wound Laterality: Right, Anterior Peri-Wound Care: Sween Lotion (Moisturizing lotion) 1 x Per Week/30 Days Discharge Instructions: Apply moisturizing lotion as directed Prim Dressing: Promogran Prisma Matrix, 4.34 (sq in) (silver collagen) 1 x Per Week/30 Days ary Discharge Instructions: Moisten collagen with saline or hydrogel Secondary Dressing: ABD Pad, 8x10 1 x Per Week/30 Days Discharge Instructions: Apply over primary dressing as directed. Secondary Dressing: Woven Gauze Sponge, Non-Sterile 4x4 in 1 x Per Week/30 Days Discharge Instructions: Apply over primary dressing as directed. Secondary Dressing: Zetuvit Plus 4x8 in 1 x Per Week/30 Days Discharge Instructions: Apply over primary dressing as directed. Com pression Wrap: ThreePress (3 layer compression wrap) 1 x Per Week/30 Days Discharge Instructions: Apply three layer compression as directed. 07/14/2022: The left  dorsal foot wound is smaller today with just a little slough and eschar present. The right anterior lower leg wound is about the same without any significant change. The medial leg wound has a black and green surface and is malodorous. No purulent drainage and the periwound skin is intact. Edema control is suboptimal. No debridement was necessary for the right anterior tibial wound. I used a curette to debride slough and eschar from the left dorsal foot wound. I used a curette to debride slough and nonviable subcutaneous tissue from the right medial leg wound. Due to its appearance and odor, I also took a culture. I am just not sure why we are struggling so much with this particular lesion. I am going to empirically apply topical mupirocin and mix this with Santyl to continue enzymatic debridement of the site. We will use Prisma silver collagen to the dorsal foot and right anterior tibial leg wounds. Continue bilateral 3 layer compression. Once culture data return, I will determine if he needs systemic antimicrobial therapy versus a compounded topical option. Follow-up in  1 week. Electronic Signature(s) Signed: 07/14/2022 1:08:44 PM By: Fredirick Maudlin MD FACS Entered By: Fredirick Maudlin on 07/14/2022 13:08:44 [name] ([id number]) [id number] Page 12 of 14 -------------------------------------------------------------------------------- HxROS Details Patient Name: Date of Service: [name], [name] 07/14/2022 12:30 PM Medical Record Number: [id number] Patient Account Number: [id number] Date of Birth/Sex: Treating RN: 1950-10-16 (71 y.o. M) Primary Care Provider: PA [name], NO Other Clinician: Referring Provider: Treating Provider/Extender: [name] in Treatment: 22 Information Obtained From Patient Chart Constitutional Symptoms (General Health) Medical History: Past Medical History Notes: obesity Eyes Medical History: Positive for:  Cataracts - right eye removed Negative for: Glaucoma; Optic Neuritis Ear/Nose/Mouth/Throat Medical History: Negative for: Chronic sinus problems/congestion; Middle ear problems Past Medical History Notes: hard of hearing Hematologic/Lymphatic Medical History: Positive for: Anemia - macrocytic; Lymphedema Respiratory Medical History: Past Medical History Notes: frozen diaphragm right Cardiovascular Medical History: Positive for: Congestive Heart Failure; Deep Vein Thrombosis - left leg; Hypertension; Peripheral Venous Disease Past Medical History Notes: hyperlipidemia Endocrine Medical History: Negative for: Type I Diabetes; Type II Diabetes Genitourinary Medical History: Negative for: End Stage Renal Disease Integumentary (Skin) Medical History: Negative for: History of Burn Musculoskeletal Medical History: Past Medical History Notes: myositis, lumbar DDD, spinal stenosis, cervical facet joint syndrome Oncologic Medical HistoryTREYVONNE, TATA ([id number]) [id number] Page 13 of 14 Negative for: Received Chemotherapy; Received Radiation Psychiatric Medical History: Negative for: Anorexia/bulimia; Confinement Anxiety HBO Extended History Items Eyes: Cataracts Immunizations Pneumococcal Vaccine: Received Pneumococcal Vaccination: Yes Received Pneumococcal Vaccination On or After 60th Birthday: Yes Implantable Devices No devices added Hospitalization / Surgery History Type of Hospitalization/Surgery cervical fusion lumbar surgery coronary stent placement lasik eye surgery hydrocele excision Family and Social History Cancer: Yes - Mother; Diabetes: No; Heart Disease: Yes - Father,Mother; Hereditary Spherocytosis: No; Hypertension: Yes - Father,Siblings; Kidney Disease: No; Lung Disease: Yes - Siblings; Seizures: No; Stroke: No; Thyroid Problems: No; Tuberculosis: No; Former smoker - quit 1991; Marital Status - Married; Alcohol Use:  Moderate; Drug Use: No History; Caffeine Use: Daily - coffee; Financial Concerns: No; Food, Clothing or Shelter Needs: No; Support System Lacking: No; Transportation Concerns: No Electronic Signature(s) Signed: 07/14/2022 4:53:29 PM By: Fredirick Maudlin MD FACS Entered By: Fredirick Maudlin on 07/14/2022 13:06:13 -------------------------------------------------------------------------------- SuperBill Details Patient Name: Date of Service: [name], [name] 07/14/2022 Medical Record Number: [id number] Patient Account Number: [id number] Date of Birth/Sex: Treating RN: 1951-02-06 (71 y.o. 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

## 2022-07-15 NOTE — Progress Notes (Signed)
RAINE, ELSASS ([id number]) [id number] Page 1 of 13 Visit Report for 07/14/2022 Arrival Information Details Patient Name: Date of Service: [name], [name] 07/14/2022 12:30 PM Medical Record Number: [id number] Patient Account Number: [id number] Date of Birth/Sex: Treating RN: [birth year] (71 y.o. [name] Primary Care [name]: PA [name], [place] Other Clinician: Referring [name]: Treating [name]/Extender: [name] in Treatment: 22 Visit Information History Since Last Visit Added or deleted any medications: No Patient Arrived: Wheel Chair Any new allergies or adverse reactions: No Arrival Time: 12:40 Had a fall or experienced change in No Accompanied By: self activities of daily living that may affect Transfer Assistance: None risk of falls: Patient Requires Transmission-Based Precautions: No Signs or symptoms of abuse/neglect since last visito No Patient Has Alerts: Yes Hospitalized since last visit: No Patient Alerts: R ABI= N/C, TBI= .75 Implantable device outside of the clinic excluding No L ABI =N/C, TBI = .57 cellular tissue based products placed in the center since last visit: Has Compression in Place as Prescribed: Yes Pain Present Now: Yes Electronic Signature(s) Signed: 07/15/2022 4:49:39 PM By: Blanche East RN Entered By: Blanche East on 07/14/2022 12:40:59 -------------------------------------------------------------------------------- Compression Therapy Details Patient Name: Date of Service: [name], [name] 07/14/2022 12:30 PM Medical Record Number: [id number] Patient Account Number: [id number] Date of Birth/Sex: Treating RN: [name] (71 y.o. [name] Primary Care Rashanna Christiana: PA [name], [place] Other Clinician: Referring [name]: Treating [name]: [name] in Treatment: 22 Compression Therapy Performed for Wound Assessment: Wound #1 Left,Dorsal Foot Performed By:  Clinician Blanche East, RN Compression Type: Three Layer Post Procedure Diagnosis Same as Pre-procedure Electronic Signature(s) Signed: 07/15/2022 4:49:39 PM By: Blanche East RN Entered By: Blanche East on 07/14/2022 13:02:46 [name] ([id number]) [id number] Page 2 of 13 -------------------------------------------------------------------------------- Compression Therapy Details Patient Name: Date of Service: [name], [name] 07/14/2022 12:30 PM Medical Record Number: [id number] Patient Account Number: [id number] Date of Birth/Sex: Treating RN: [birth year] (71 y.o. [name] Primary Care Abayomi Pattison: PA [name], [place] Other Clinician: Referring [name]: Treating [name]: [name] in Treatment: 22 Compression Therapy Performed for Wound Assessment: Wound #3 Right,Medial Lower Leg Performed By: Clinician Blanche East, RN Compression Type: Three Layer Post Procedure Diagnosis Same as Pre-procedure Electronic Signature(s) Signed: 07/15/2022 4:49:39 PM By: Blanche East RN Entered By: Blanche East on 07/14/2022 13:02:46 -------------------------------------------------------------------------------- Compression Therapy Details Patient Name: Date of Service: [name], [name] 07/14/2022 12:30 PM Medical Record Number: [id number] Patient Account Number: [id number] Date of Birth/Sex: Treating RN: [name] (71 y.o. [name] Primary Care [name]: PA [name], [place] Other Clinician: Referring [name]: Treating [name]: [name] in Treatment: 22 Compression Therapy Performed for Wound Assessment: Wound #6 Right,Anterior Lower Leg Performed By: Clinician Blanche East, RN Compression Type: Three Layer Post Procedure Diagnosis Same as Pre-procedure Electronic Signature(s) Signed: 07/15/2022 4:49:39 PM By: Blanche East RN Entered By: Blanche East on 07/14/2022  13:02:46 -------------------------------------------------------------------------------- Encounter Discharge Information Details Patient Name: Date of Service: [name], [name] 07/14/2022 12:30 PM Medical Record Number: [id number] Patient Account Number: [id number] Date of Birth/Sex: Treating RN: [name] (71 y.o. [name] Primary Care [name]: PA [name], [place] Other Clinician: Referring Shriya Aker: Treating [name]: [name] in Treatment: 22 Encounter Discharge Information Items Post Procedure Vitals Discharge Condition: Stable Temperature (F): 98.2 Ambulatory Status: Wheelchair Pulse (bpm): 76 Discharge Destination: Home Respiratory Rate (breaths/min): 16 Transportation: Private Auto Blood Pressure (mmHg): 128/77 Accompanied By: self Schedule Follow-up Appointment: No Clinical Summary of Care:  [name] ([id number]) [id number] Page 3 of 13 Electronic Signature(s) Signed: 07/15/2022 4:49:39 PM By: Blanche East RN Entered By: Blanche East on 07/14/2022 13:27:30 -------------------------------------------------------------------------------- Lower Extremity Assessment Details Patient Name: Date of Service: [name], [name] 07/14/2022 12:30 PM Medical Record Number: [id number] Patient Account Number: [id number] Date of Birth/Sex: Treating RN: [name] (71 y.o. [name] Primary Care Hendel Gatliff: PA [name], [place] Other Clinician: Referring [name]: Treating [name]: [name] Weeks in Treatment: 22 Edema Assessment Assessed: [Left: No] [Right: No] Edema: [Left: Yes] [Right: Yes] Calf Left: Right: Point of Measurement: From Medial Instep 41 cm 44 cm Ankle Left: Right: Point of Measurement: From Medial Instep 24 cm 26 cm Vascular Assessment Pulses: Dorsalis Pedis Palpable: [Left:Yes] [Right:Yes] Electronic Signature(s) Signed: 07/15/2022 4:49:39 PM By: Blanche East  RN Entered By: Blanche East on 07/14/2022 12:41:39 -------------------------------------------------------------------------------- Multi Wound Chart Details Patient Name: Date of Service: [name] 07/14/2022 12:30 PM Medical Record Number: [id number] Patient Account Number: [id number] Date of Birth/Sex: Treating RN: [birth year] [name] (71 y.o. M) Primary Care Caliope Ruppert: PA [name], NO Other Clinician: Referring Rielynn Trulson: Treating Nareh Matzke/Extender: [name] in Treatment: 22 Vital Signs Height(in): 71 Pulse(bpm): 76 Weight(lbs): 267 Blood Pressure(mmHg): 128/77 Body Mass Index(BMI): 37.2 Temperature(F): 98.2 Respiratory Rate(breaths/min): 16 [1:Photos: No Photos] [id number] Page 4 of 13] Left, Dorsal Foot Left, Dorsal Foot Right, Medial Lower Leg Wound Location: Gradually Appeared Gradually Appeared Gradually Appeared Wounding Event: Lymphedema Lymphedema Lymphedema Primary Etiology: Cataracts, Anemia, Lymphedema, Cataracts, Anemia, Lymphedema, Cataracts, Anemia, Lymphedema, Comorbid History: Congestive Heart Failure, Deep Vein Congestive Heart Failure, Deep Vein Congestive Heart Failure, Deep Vein Thrombosis, Hypertension, Peripheral Thrombosis, Hypertension, Peripheral Thrombosis, Hypertension, Peripheral Venous Disease Venous Disease Venous Disease 12/07/2021 12/07/2021 12/07/2021 Date Acquired: '22 22 22 '$ Weeks of Treatment: Open Open Open Wound Status: No No No Wound Recurrence: 0.5x0.4x0.1 0.5x0.4x0.1 3x5.2x0.6 Measurements L x W x D (cm) 0.157 0.157 12.252 A (cm) : rea 0.016 0.016 7.351 Volume (cm) : 95.90% 95.90% -129.40% % Reduction in Area: 98.90% 98.90% -244.10% % Reduction in Volume: No N/A No Tunneling: Full Thickness Without Exposed Full Thickness Without Exposed Full Thickness With Exposed Support Classification: Support Structures Support Structures Structures Medium Medium Large Exudate A  mount: Serosanguineous Serosanguineous Serosanguineous Exudate Type: red, brown red, brown red, brown Exudate Color: No N/A Yes Foul Odor A Cleansing: fter N/A N/A No Odor Anticipated Due to Product Use: Distinct, outline attached N/A Distinct, outline attached Wound Margin: Medium (34-66%) N/A Small (1-33%) Granulation A mount: Pink N/A Hyper-granulation, Friable Granulation Quality: Medium (34-66%) N/A Large (67-100%) Necrotic Amount: N/A N/A Eschar, Adherent Slough Necrotic Tissue: Fat Layer (Subcutaneous Tissue): Yes N/A Fat Layer (Subcutaneous Tissue): Yes Exposed Structures: Fascia: No Fascia: No Tendon: No Tendon: No Muscle: No Muscle: No Joint: No Joint: No Bone: No Bone: No Large (67-100%) N/A Small (1-33%) Epithelialization: Debridement - Selective/Open Wound Debridement - Selective/Open Wound Debridement - Excisional Debridement: Pre-procedure Verification/Time Out 12:54 12:54 12:54 Taken: Lidocaine 5% topical ointment Lidocaine 5% topical ointment Lidocaine 5% topical ointment Pain Control: Necrotic/Eschar, Psychologist, prison and probation services, [employer], [employer] Tissue Debrided: Non-Viable Tissue Non-Viable Tissue Skin/Subcutaneous Tissue Level: 0.2 0.2 15.6 Debridement A (sq cm): rea Curette Curette Curette Instrument: Minimum Minimum Minimum Bleeding: Pressure Pressure Pressure Hemostasis A chieved: Procedure was tolerated well Procedure was tolerated well Procedure was tolerated well Debridement Treatment Response: 0.5x0.4x0.1 0.5x0.4x0.1 3x5.2x0.6 Post Debridement Measurements L x W x D (cm) 0.016 0.016 7.351 Post Debridement Volume: (cm) Excoriation: No Excoriation: Yes Periwound Skin Texture: Induration: No Callus: No  Crepitus: No Rash: No Scarring: No Maceration: No No Abnormalities Noted Periwound Skin Moisture: Dry/Scaly: No Hemosiderin Staining: Yes Hemosiderin Staining: Yes Periwound Skin Color: Atrophie Blanche:  No Erythema: No Cyanosis: No Ecchymosis: No Erythema: No Mottled: No Pallor: No Rubor: No No Abnormality N/A No Abnormality Temperature: Yes N/A Yes Tenderness on Palpation: Compression Therapy Compression Therapy Compression Therapy Procedures Performed: Debridement Debridement Debridement Wound Number: 6 N/A N/A Photos: N/A Biagio Borg ([id number]) [id number] Page 5 of 13 Right, Anterior Lower Leg N/A N/A Wound Location: Gradually Appeared N/A N/A Wounding Event: Calcinosis N/A N/A Primary Etiology: Cataracts, Anemia, Lymphedema, N/A N/A Comorbid History: Congestive Heart Failure, Deep Vein Thrombosis, Hypertension, Peripheral Venous Disease 05/12/2022 N/A N/A Date Acquired: 9 N/A N/A Weeks of Treatment: Open N/A N/A Wound Status: No N/A N/A Wound Recurrence: 0.4x0.2x0.3 N/A N/A Measurements L x W x D (cm) 0.063 N/A N/A A (cm) : rea 0.019 N/A N/A Volume (cm) : 73.30% N/A N/A % Reduction in A rea: 20.80% N/A N/A % Reduction in Volume: 1 Position 1 (o'clock): 0.6 Maximum Distance 1 (cm): Yes N/A N/A Tunneling: Full Thickness Without Exposed N/A N/A Classification: Support Structures Medium N/A N/A Exudate A mount: Serosanguineous N/A N/A Exudate Type: red, brown N/A N/A Exudate Color: No N/A N/A Foul Odor A Cleansing: fter N/A N/A N/A Odor Anticipated Due to Product Use: Epibole N/A N/A Wound Margin: Large (67-100%) N/A N/A Granulation A mount: Red N/A N/A Granulation Quality: Small (1-33%) N/A N/A Necrotic Amount: Adherent Slough N/A N/A Necrotic Tissue: Fat Layer (Subcutaneous Tissue): Yes N/A N/A Exposed Structures: Fascia: No Tendon: No Muscle: No Joint: No Bone: No None N/A N/A Epithelialization: N/A N/A N/A Debridement: N/A N/A N/A Pain Control: N/A N/A N/A Tissue Debrided: N/A N/A N/A Level: N/A N/A N/A Debridement A (sq cm): rea N/A N/A N/A Instrument: N/A N/A  N/A Bleeding: N/A N/A N/A Hemostasis A chieved: Debridement Treatment Response: N/A N/A N/A Post Debridement Measurements L x N/A N/A N/A W x D (cm) N/A N/A N/A Post Debridement Volume: (cm) Excoriation: No N/A N/A Periwound Skin Texture: Induration: No Callus: No Crepitus: No Rash: No Scarring: No Maceration: No N/A N/A Periwound Skin Moisture: Dry/Scaly: No Hemosiderin Staining: Yes N/A N/A Periwound Skin Color: Atrophie Blanche: No Cyanosis: No Ecchymosis: No Erythema: No Mottled: No Pallor: No Rubor: No No Abnormality N/A N/A Temperature: Yes N/A N/A Tenderness on Palpation: Compression Therapy N/A N/A Procedures Performed: Treatment Notes Electronic Signature(s) Signed: 07/14/2022 1:04:58 PM By: Fredirick Maudlin MD FACS Entered By: Fredirick Maudlin on 07/14/2022 13:04:58 [name] ([id number]) [id number] Page 6 of 13 -------------------------------------------------------------------------------- Multi-Disciplinary Care Plan Details Patient Name: Date of Service: [name], [name] 07/14/2022 12:30 PM Medical Record Number: [id number] Patient Account Number: [id number] Date of Birth/Sex: Treating RN: [name] (71 y.o. [name] Primary Care Asha Grumbine: PA [name], [place] Other Clinician: Referring Tiyah Zelenak: Treating Norene Oliveri/Extender: [name] in Treatment: 22 Multidisciplinary Care Plan reviewed with physician Active Inactive Venous Leg Ulcer Nursing Diagnoses: Knowledge deficit related to disease process and management Potential for venous Insuffiency (use before diagnosis confirmed) Goals: Patient will maintain optimal edema control Date Initiated: 02/17/2022 Target Resolution Date: 08/04/2022 Goal Status: Active Interventions: Assess peripheral edema status every visit. Compression as ordered Provide education on venous insufficiency Treatment Activities: Non-invasive vascular studies :  02/17/2022 Therapeutic compression applied : 02/17/2022 Notes: Wound/Skin Impairment Nursing Diagnoses: Impaired tissue integrity Knowledge deficit related to ulceration/compromised skin integrity Goals: Patient/caregiver will verbalize understanding of skin care regimen Date Initiated:  02/17/2022 Target Resolution Date: 08/04/2022 Goal Status: Active Ulcer/skin breakdown will have a volume reduction of 30% by week 4 Date Initiated: 02/17/2022 Date Inactivated: 03/10/2022 Target Resolution Date: 03/10/2022 Unmet Reason: infection being treated, Goal Status: Unmet waiting on derm appte Ulcer/skin breakdown will have a volume reduction of 50% by week 8 Date Initiated: 03/10/2022 Date Inactivated: 04/14/2022 Target Resolution Date: 04/07/2022 Goal Status: Unmet Unmet Reason: calcinosis Ulcer/skin breakdown will have a volume reduction of 80% by week 12 Date Initiated: 04/14/2022 Date Inactivated: 05/05/2022 Target Resolution Date: 05/05/2022 Unmet Reason: infection, chronic Goal Status: Unmet condition Interventions: Assess patient/caregiver ability to obtain necessary supplies Assess patient/caregiver ability to perform ulcer/skin care regimen upon admission and as needed Assess ulceration(s) every visit Provide education on ulcer and skin care Treatment Activities: Skin care regimen initiated : 02/17/2022 Topical wound management initiated : 02/17/2022 Notes: LIMMIE, SCHOENBERG ([id number]) [phone].pdf Page 7 of 13 Electronic Signature(s) Signed: 07/15/2022 4:49:39 PM By: Blanche East RN Entered By: Blanche East on 07/14/2022 13:06:45 -------------------------------------------------------------------------------- Pain Assessment Details Patient Name: Date of Service: [name], [name] 07/14/2022 12:30 PM Medical Record Number: [id number] Patient Account Number: [id number] Date of Birth/Sex: Treating RN: [birth year] (71 y.o. [name] Primary Care Danaija Eskridge: PA  [name], [place] Other Clinician: Referring Evola Hollis: Treating Sherlock Nancarrow/Extender: [name] in Treatment: 22 Active Problems Location of Pain Severity and Description of Pain Patient Has Paino No Site Locations Pain Management and Medication Current Pain Management: Electronic Signature(s) Signed: 07/15/2022 4:49:39 PM By: Blanche East RN Entered By: Blanche East on 07/14/2022 12:41:25 -------------------------------------------------------------------------------- Patient/Caregiver Education Details Patient Name: Date of Service: [name], [name] 12/20/2023andnbsp12:30 PM Medical Record Number: [id number] Patient Account Number: [id number] Date of Birth/Gender: Treating RN: [name] (71 y.o. [name] Primary Care Physician: PA [name], [place] Other Clinician: Referring Physician: Treating Physician/Extender: [name] in Treatment: 22 Education Assessment Education Provided To: Patient YERIK, ZERINGUE ([id number]) [id number] Page 8 of 13 Education Topics Provided Wound/Skin Impairment: Methods: Explain/Verbal Responses: Reinforcements needed, State content correctly Electronic Signature(s) Signed: 07/15/2022 4:49:39 PM By: Blanche East RN Entered By: Blanche East on 07/14/2022 13:06:59 -------------------------------------------------------------------------------- Wound Assessment Details Patient Name: Date of Service: [name], [name] 07/14/2022 12:30 PM Medical Record Number: [id number] Patient Account Number: [id number] Date of Birth/Sex: Treating RN: [name][birth year] (71 y.o. [name] Primary Care Onnie Alatorre: PA [name], [place] Other Clinician: Referring Shayra Anton: Treating Emett Stapel/Extender: [name] in Treatment: 22 Wound Status Wound Number: 1 Primary Lymphedema Etiology: Wound Location: Left, Dorsal Foot Wound Open Wounding Event: Gradually Appeared Status: Date  Acquired: 12/07/2021 Comorbid Cataracts, Anemia, Lymphedema, Congestive Heart Failure, Deep Weeks Of Treatment: 22 History: Vein Thrombosis, Hypertension, Peripheral Venous Disease Clustered Wound: No Wound Measurements Length: (cm) 0.5 Width: (cm) 0.4 Depth: (cm) 0.1 Area: (cm) 0.157 Volume: (cm) 0.016 % Reduction in Area: 95.9% % Reduction in Volume: 98.9% Epithelialization: Large (67-100%) Tunneling: No Undermining: No Wound Description Classification: Full Thickness Without Exposed Suppor Wound Margin: Distinct, outline attached Exudate Amount: Medium Exudate Type: Serosanguineous Exudate Color: red, brown t Structures Foul Odor After Cleansing: No Slough/Fibrino Yes Wound Bed Granulation Amount: Medium (34-66%) Exposed Structure Granulation Quality: Pink Fascia Exposed: No Necrotic Amount: Medium (34-66%) Fat Layer (Subcutaneous Tissue) Exposed: Yes Tendon Exposed: No Muscle Exposed: No Joint Exposed: No Bone Exposed: No Periwound Skin Texture Texture Color No Abnormalities Noted: Yes No Abnormalities Noted: No Atrophie Blanche: No Moisture Cyanosis: No No Abnormalities Noted: Yes Ecchymosis: No Erythema: No Hemosiderin Staining: Yes Mottled: No Pallor: No  Rubor: No Temperature / Pain Temperature: No Abnormality Tenderness on PalpationMAHDI, FRYE ([id number]) [id number].pdf Page 9 of 13 Electronic Signature(s) Signed: 07/15/2022 4:49:39 PM By: Blanche East RN Entered By: Blanche East on 07/14/2022 12:48:07 -------------------------------------------------------------------------------- Wound Assessment Details Patient Name: Date of Service: [name], [name] 07/14/2022 12:30 PM Medical Record Number: [id number] Patient Account Number: [id number] Date of Birth/Sex: Treating RN: [birth year] (71 y.o. [name] Primary Care Meeah Totino: PA [name], [place] Other Clinician: Referring Conception Doebler: Treating Airon Sahni/Extender: [name] in Treatment: 22 Wound Status Wound Number: 1 Primary Lymphedema Etiology: Wound Location: Left, Dorsal Foot Wound Open Wounding Event: Gradually Appeared Status: Date Acquired: 12/07/2021 Comorbid Cataracts, Anemia, Lymphedema, Congestive Heart Failure, Deep Weeks Of Treatment: 22 History: Vein Thrombosis, Hypertension, Peripheral Venous Disease Clustered Wound: No Photos Wound Measurements Length: (cm) 0.5 Width: (cm) 0.4 Depth: (cm) 0.1 Area: (cm) 0.157 Volume: (cm) 0.016 % Reduction in Area: 95.9% % Reduction in Volume: 98.9% Wound Description Classification: Full Thickness Without Exposed Suppor Exudate Amount: Medium Exudate Type: Serosanguineous Exudate Color: red, brown t Structures Periwound Skin Texture Texture Color No Abnormalities Noted: No No Abnormalities Noted: No Moisture No Abnormalities Noted: No Treatment Notes Wound #1 (Foot) Wound Laterality: Dorsal, Left Cleanser Peri-Wound Care Sween Lotion (Moisturizing lotion) Discharge Instruction: Apply moisturizing lotion as directed LEANDRE, WIEN ([id number]) [id number] Page 10 of 13 Topical Primary Dressing Promogran Prisma Matrix, 4.34 (sq in) (silver collagen) Discharge Instruction: Moisten collagen with saline or hydrogel Secondary Dressing Woven Gauze Sponge, Non-Sterile 4x4 in Discharge Instruction: Apply over primary dressing as directed. Secured With Compression Wrap ThreePress (3 layer compression wrap) Discharge Instruction: Apply three layer compression as directed. Compression Stockings Add-Ons Electronic Signature(s) Signed: 07/15/2022 4:49:39 PM By: Blanche East RN Entered By: Blanche East on 07/14/2022 12:49:00 -------------------------------------------------------------------------------- Wound Assessment Details Patient Name: Date of Service: ARLIN, SASS 07/14/2022 12:30 PM Medical Record Number: [id number] Patient  Account Number: [id number] Date of Birth/Sex: Treating RN: [name] (71 y.o. [name] Primary Care Tomi Paddock: PA [name], [place] Other Clinician: Referring Shanesha Bednarz: Treating Henlee Donovan/Extender: [name] in Treatment: 22 Wound Status Wound Number: 3 Primary Lymphedema Etiology: Wound Location: Right, Medial Lower Leg Wound Open Wounding Event: Gradually Appeared Status: Date Acquired: 12/07/2021 Comorbid Cataracts, Anemia, Lymphedema, Congestive Heart Failure, Deep Weeks Of Treatment: 22 History: Vein Thrombosis, Hypertension, Peripheral Venous Disease Clustered Wound: No Photos Wound Measurements Length: (cm) 3 Width: (cm) 5.2 Depth: (cm) 0.6 Area: (cm) 12.252 Volume: (cm) 7.351 % Reduction in Area: -129.4% % Reduction in Volume: -244.1% Epithelialization: Small (1-33%) Tunneling: No Undermining: No Wound Description Classification: Full Thickness With Exposed Support Structures Wound Margin: Distinct, outline attached Exudate Amount: Large Exudate Type: Serosanguineous Mukai, Rohaan ([id number]) Exudate Color: red, brown Foul Odor After Cleansing: Yes Due to Product Use: No Slough/Fibrino Yes [id number] Page 11 of 13 Wound Bed Granulation Amount: Small (1-33%) Exposed Structure Granulation Quality: Hyper-granulation, Friable Fascia Exposed: No Necrotic Amount: Large (67-100%) Fat Layer (Subcutaneous Tissue) Exposed: Yes Necrotic Quality: Eschar, Adherent Slough Tendon Exposed: No Muscle Exposed: No Joint Exposed: No Bone Exposed: No Periwound Skin Texture Texture Color No Abnormalities Noted: Yes No Abnormalities Noted: No Erythema: No Moisture Hemosiderin Staining: Yes No Abnormalities Noted: Yes Temperature / Pain Temperature: No Abnormality Tenderness on Palpation: Yes Treatment Notes Wound #3 (Lower Leg) Wound Laterality: Right, Medial Cleanser Peri-Wound Care Sween Lotion (Moisturizing  lotion) Discharge Instruction: Apply moisturizing lotion as directed Topical Gentamicin Discharge Instruction: As directed by physician Primary Dressing Santyl Ointment Discharge Instruction:  Apply nickel thick amount to wound bed as instructed Secondary Dressing ABD Pad, 8x10 Discharge Instruction: Apply over primary dressing as directed. Woven Gauze Sponge, Non-Sterile 4x4 in Discharge Instruction: Apply over primary dressing as directed. Zetuvit Plus 4x8 in Discharge Instruction: Apply over primary dressing as directed. Secured With Compression Wrap ThreePress (3 layer compression wrap) Discharge Instruction: Apply three layer compression as directed. Compression Stockings Add-Ons Electronic Signature(s) Signed: 07/15/2022 4:49:39 PM By: Blanche East RN Entered By: Blanche East on 07/14/2022 12:49:27 -------------------------------------------------------------------------------- Wound Assessment Details Patient Name: Date of Service: SILVIO, SAUSEDO 07/14/2022 12:30 PM Medical Record Number: [id number] Patient Account Number: [id number] Date of Birth/Sex: Treating RN: [birth year] (71 y.o. [name] Primary Care Jamori Biggar: PA Darnelle Spangle Other Clinician: BRAYLEN, STALLER ([id number]) [id number] Page 12 of 13 Referring Veasna Santibanez: Treating Mixtli Reno/Extender: [name] Weeks in Treatment: 22 Wound Status Wound Number: 6 Primary Calcinosis Etiology: Wound Location: Right, Anterior Lower Leg Wound Open Wounding Event: Gradually Appeared Status: Date Acquired: 05/12/2022 Comorbid Cataracts, Anemia, Lymphedema, Congestive Heart Failure, Deep Weeks Of Treatment: 9 History: Vein Thrombosis, Hypertension, Peripheral Venous Disease Clustered Wound: No Photos Wound Measurements Length: (cm) 0.4 Width: (cm) 0.2 Depth: (cm) 0.3 Area: (cm) 0.063 Volume: (cm) 0.019 % Reduction in Area: 73.3% % Reduction in Volume:  20.8% Epithelialization: None Tunneling: Yes Position (o'clock): 1 Maximum Distance: (cm) 0.6 Undermining: No Wound Description Classification: Full Thickness Without Exposed Suppor Wound Margin: Epibole Exudate Amount: Medium Exudate Type: Serosanguineous Exudate Color: red, brown t Structures Foul Odor After Cleansing: No Slough/Fibrino Yes Wound Bed Granulation Amount: Large (67-100%) Exposed Structure Granulation Quality: Red Fascia Exposed: No Necrotic Amount: Small (1-33%) Fat Layer (Subcutaneous Tissue) Exposed: Yes Necrotic Quality: Adherent Slough Tendon Exposed: No Muscle Exposed: No Joint Exposed: No Bone Exposed: No Periwound Skin Texture Texture Color No Abnormalities Noted: Yes No Abnormalities Noted: No Atrophie Blanche: No Moisture Cyanosis: No No Abnormalities Noted: Yes Ecchymosis: No Erythema: No Hemosiderin Staining: Yes Mottled: No Pallor: No Rubor: No Temperature / Pain Temperature: No Abnormality Tenderness on Palpation: Yes Treatment Notes Wound #6 (Lower Leg) Wound Laterality: Right, Anterior Cleanser Peri-Wound Care Sween Lotion (Moisturizing lotion) CORRADO, HYMON ([id number]) [id number] Page 13 of 13 Discharge Instruction: Apply moisturizing lotion as directed Topical Primary Dressing Promogran Prisma Matrix, 4.34 (sq in) (silver collagen) Discharge Instruction: Moisten collagen with saline or hydrogel Secondary Dressing ABD Pad, 8x10 Discharge Instruction: Apply over primary dressing as directed. Woven Gauze Sponge, Non-Sterile 4x4 in Discharge Instruction: Apply over primary dressing as directed. Zetuvit Plus 4x8 in Discharge Instruction: Apply over primary dressing as directed. Secured With Compression Wrap ThreePress (3 layer compression wrap) Discharge Instruction: Apply three layer compression as directed. Compression Stockings Add-Ons Electronic Signature(s) Signed: 07/15/2022 4:49:39 PM By:  Blanche East RN Entered By: Blanche East on 07/14/2022 12:49:55 -------------------------------------------------------------------------------- Vitals Details Patient Name: Date of Service: [name] 07/14/2022 12:30 PM Medical Record Number: [id number] Patient Account Number: [id number] Date of Birth/Sex: Treating RN: [birth year] (71 y.o. [name] Primary Care Puanani Gene: PA [name], [place] Other Clinician: Referring Merlina Marchena: Treating Reighan Hipolito/Extender: [name] in Treatment: 22 Vital Signs Time Taken: 12:40 Temperature (F): 98.2 Height (in): 71 Pulse (bpm): 76 Weight (lbs): 267 Respiratory Rate (breaths/min): 16 Body Mass Index (BMI): 37.2 Blood Pressure (mmHg): 128/77 Reference Range: 80 - 120 mg / dl Electronic Signature(s) Signed: 07/15/2022 4:49:39 PM By: Blanche East RN Entered By: Blanche East on 07/14/2022 12:41:18

## 2022-07-21 ENCOUNTER — Encounter (HOSPITAL_BASED_OUTPATIENT_CLINIC_OR_DEPARTMENT_OTHER): Payer: Medicare Other | Admitting: General Surgery

## 2022-07-21 DIAGNOSIS — L97522 Non-pressure chronic ulcer of other part of left foot with fat layer exposed: Secondary | ICD-10-CM | POA: Diagnosis not present

## 2022-07-21 DIAGNOSIS — L942 Calcinosis cutis: Secondary | ICD-10-CM | POA: Diagnosis not present

## 2022-07-21 DIAGNOSIS — L97812 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d: Secondary | ICD-10-CM | POA: Diagnosis not present

## 2022-07-21 DIAGNOSIS — Z86718 Personal history of other venous thrombosis and embolism: Secondary | ICD-10-CM | POA: Diagnosis not present

## 2022-07-21 DIAGNOSIS — I890 Lymphedema, not elsewhere classified: Secondary | ICD-10-CM | POA: Diagnosis not present

## 2022-07-21 DIAGNOSIS — I872 Venous insufficiency (chronic) (peripheral): Secondary | ICD-10-CM | POA: Diagnosis not present

## 2022-07-23 NOTE — Progress Notes (Signed)
KENDRYCK, LACROIX ([id number]) [id number] Page 1 of 9 Visit Report for 07/21/2022 Arrival Information Details Patient Name: Date of Service: [name], [name] 07/21/2022 12:30 PM Medical Record Number: [id number] Patient Account Number: [id number] Date of Birth/Sex: Treating RN: May 16, 1951 (71 y.o. [name] Primary Care Camry Theiss: PA [name], [place] Other Clinician: Referring Teondre Jarosz: Treating Beyonce Sawatzky/Extender: [name] in Treatment: 23 Visit Information History Since Last Visit Added or deleted any medications: No Patient Arrived: Ambulatory Any new allergies or adverse reactions: No Arrival Time: 12:38 Had a fall or experienced change in No Accompanied By: self activities of daily living that may affect Transfer Assistance: None risk of falls: Patient Requires Transmission-Based Precautions: No Signs or symptoms of abuse/neglect since last visito No Patient Has Alerts: Yes Hospitalized since last visit: No Patient Alerts: R ABI= N/C, TBI= .75 Implantable device outside of the clinic excluding No L ABI =N/C, TBI = .57 cellular tissue based products placed in the center since last visit: Has Compression in Place as Prescribed: Yes Pain Present Now: Yes Electronic Signature(s) Signed: 07/23/2022 12:15:24 PM By: Blanche East RN Entered By: Blanche East on 07/21/2022 12:43:58 -------------------------------------------------------------------------------- Encounter Discharge Information Details Patient Name: Date of Service: [name] 07/21/2022 12:30 PM Medical Record Number: [id number] Patient Account Number: [id number] Date of Birth/Sex: Treating RN: 02/07/1951 (71 y.o. [name] Primary Care Yeily Link: PA [name], [place] Other Clinician: Referring Robecca Fulgham: Treating Bridgitte Felicetti/Extender: [name] in Treatment: 23 Encounter Discharge Information Items Post Procedure Vitals Discharge Condition:  Stable Temperature (F): 98.0 Ambulatory Status: Walker Pulse (bpm): 71 Discharge Destination: Home Respiratory Rate (breaths/min): 16 Transportation: Private Auto Blood Pressure (mmHg): 163/73 Accompanied By: self Schedule Follow-up Appointment: Yes Clinical Summary of Care: Electronic Signature(s) Signed: 07/21/2022 4:48:35 PM By: Blanche East RN Entered By: Blanche East on 07/21/2022 16:48:35 [name] ([id number]) [id number] Page 2 of 9 -------------------------------------------------------------------------------- Lower Extremity Assessment Details Patient Name: Date of Service: JOENATHAN, SAKUMA 07/21/2022 12:30 PM Medical Record Number: [id number] Patient Account Number: [id number] Date of Birth/Sex: Treating RN: 08/31/1950 (71 y.o. [name] Primary Care Nira Visscher: PA [name], [place] Other Clinician: Referring Athen Riel: Treating Haseeb Fiallos/Extender: Valeria Batman Weeks in Treatment: 23 Edema Assessment Assessed: [Left: No] [Right: No] Edema: [Left: Yes] [Right: Yes] Calf Left: Right: Point of Measurement: From Medial Instep 41 cm 45 cm Ankle Left: Right: Point of Measurement: From Medial Instep 25 cm 27.5 cm Vascular Assessment Pulses: Dorsalis Pedis Palpable: [Left:Yes] [Right:Yes] Electronic Signature(s) Signed: 07/23/2022 12:15:24 PM By: Blanche East RN Entered By: Blanche East on 07/21/2022 12:45:21 -------------------------------------------------------------------------------- Multi Wound Chart Details Patient Name: Date of Service: [name] 07/21/2022 12:30 PM Medical Record Number: [id number] Patient Account Number: [id number] Date of Birth/Sex: Treating RN: 01-19-1951 (71 y.o. M) Primary Care Timeka Goette: PA [name], [place] Other Clinician: Referring Andree Golphin: Treating Larance Ratledge/Extender: [name] in Treatment: 23 Vital Signs Height(in): Pulse(bpm): 71 Weight(lbs): 267 Blood  Pressure(mmHg): 163/73 Body Mass Index(BMI): Temperature(F): 98.0 Respiratory Rate(breaths/min): 16 [1:Photos:] [6:No Photos [id number] Page 3 of 9] Left, Dorsal Foot Right, Medial Lower Leg Right, Anterior Lower Leg Wound Location: Gradually Appeared Gradually Appeared Gradually Appeared Wounding Event: Lymphedema Lymphedema Calcinosis Primary Etiology: Cataracts, Anemia, Lymphedema, Cataracts, Anemia, Lymphedema, Cataracts, Anemia, Lymphedema, Comorbid History: Congestive Heart Failure, Deep Vein Congestive Heart Failure, Deep Vein Congestive Heart Failure, Deep Vein Thrombosis, Hypertension, Peripheral Thrombosis, Hypertension, Peripheral Thrombosis, Hypertension, Peripheral Venous Disease Venous Disease Venous Disease 12/07/2021 12/07/2021 05/12/2022 Date Acquired: '23 23 10 '$ Weeks of Treatment: Open Open  Open Wound Status: No No No Wound Recurrence: 0.9x0.7x0.1 3x3.5x0.6 0.3x0.2x0.2 Measurements L x W x D (cm) 0.495 8.247 0.047 A (cm) : rea 0.049 4.948 0.009 Volume (cm) : 86.90% -54.40% 80.10% % Reduction in Area: 96.80% -131.60% 62.50% % Reduction in Volume: Full Thickness Without Exposed Full Thickness With Exposed Support Full Thickness Without Exposed Classification: Support Structures Structures Support Structures Medium Large Medium Exudate A mount: Serosanguineous Serosanguineous Serosanguineous Exudate Type: red, brown red, brown red, brown Exudate Color: N/A Yes No Foul Odor A Cleansing: fter N/A No N/A Odor Anticipated Due to Product Use: N/A Distinct, outline attached Epibole Wound Margin: Medium (34-66%) Small (1-33%) Large (67-100%) Granulation A mount: Pink Hyper-granulation, Friable Red Granulation Quality: Medium (34-66%) Large (67-100%) Small (1-33%) Necrotic Amount: Eschar, Adherent Slough Eschar, Adherent Slough Adherent Slough Necrotic Tissue: Fat Layer (Subcutaneous Tissue): Yes Fat Layer (Subcutaneous  Tissue): Yes Fat Layer (Subcutaneous Tissue): Yes Exposed Structures: Fascia: No Fascia: No Fascia: No Tendon: No Tendon: No Tendon: No Muscle: No Muscle: No Muscle: No Joint: No Joint: No Joint: No Bone: No Bone: No Bone: No Small (1-33%) Small (1-33%) None Epithelialization: Debridement - Selective/Open Wound Debridement - Excisional Debridement - Selective/Open Wound Debridement: Pre-procedure Verification/Time Out 13:04 13:04 13:04 Taken: Lidocaine 5% topical ointment Lidocaine 5% topical ointment Lidocaine 5% topical ointment Pain Control: Necrotic/Eschar, Slough Subcutaneous, [employer] Tissue Debrided: Non-Viable Tissue Skin/Subcutaneous Tissue Non-Viable Tissue Level: 0.63 10.5 0.06 Debridement A (sq cm): rea Curette Curette Curette Instrument: Minimum Minimum Minimum Bleeding: Pressure Pressure Pressure Hemostasis A chieved: Procedure was tolerated well Procedure was tolerated well Procedure was tolerated well Debridement Treatment Response: 0.9x0.7x0.1 3x3.5x0.6 0.3x0.2x0.2 Post Debridement Measurements L x W x D (cm) 0.049 4.948 0.009 Post Debridement Volume: (cm) Excoriation: Yes Excoriation: No Periwound Skin Texture: Induration: No Callus: No Crepitus: No Rash: No Scarring: No Dry/Scaly: Yes No Abnormalities Noted Maceration: No Periwound Skin Moisture: Dry/Scaly: No No Abnormalities Noted Hemosiderin Staining: Yes Hemosiderin Staining: Yes Periwound Skin Color: Erythema: No Atrophie Blanche: No Cyanosis: No Ecchymosis: No Erythema: No Mottled: No Pallor: No Rubor: No N/A No Abnormality No Abnormality Temperature: N/A Yes Yes Tenderness on Palpation: Debridement Debridement Debridement Procedures Performed: Treatment Notes Electronic Signature(s) Signed: 07/21/2022 1:13:01 PM By: Fredirick Maudlin MD FACS Entered By: Fredirick Maudlin on 07/21/2022 13:13:01 [name] ([id number]) [id number] Page  4 of 9 -------------------------------------------------------------------------------- Pain Assessment Details Patient Name: Date of Service: TADAN, SHILL 07/21/2022 12:30 PM Medical Record Number: [id number] Patient Account Number: [id number] Date of Birth/Sex: Treating RN: 01-Aug-1950 (71 y.o. [name] Primary Care Manreet Kiernan: PA [name], [place] Other Clinician: Referring Takela Varden: Treating Cassady Stanczak/Extender: [name] in Treatment: 23 Active Problems Location of Pain Severity and Description of Pain Patient Has Paino No Site Locations Rate the pain. Current Pain Level: 0 Pain Management and Medication Current Pain Management: Electronic Signature(s) Signed: 07/23/2022 12:15:24 PM By: Blanche East RN Entered By: Blanche East on 07/21/2022 12:44:37 -------------------------------------------------------------------------------- Wound Assessment Details Patient Name: Date of Service: LOUI, MASSENBURG 07/21/2022 12:30 PM Medical Record Number: [id number] Patient Account Number: [id number] Date of Birth/Sex: Treating RN: Jan 26, 1951 (71 y.o. [name] Primary Care Laketha Leopard: PA [name], [place] Other Clinician: Referring Andreal Vultaggio: Treating Levie Wages/Extender: [name] in Treatment: 23 Wound Status Wound Number: 1 Primary Lymphedema Etiology: Wound Location: Left, Dorsal Foot Wound Open Wounding Event: Gradually Appeared Status: Date Acquired: 12/07/2021 Comorbid Cataracts, Anemia, Lymphedema, Congestive Heart Failure, Deep Weeks Of Treatment: 23 History: Vein Thrombosis, Hypertension, Peripheral Venous Disease Clustered Wound:  No Photos YORDAN, MARTINDALE ([id number]) [id number] Page 5 of 9 Wound Measurements Length: (cm) 0.9 Width: (cm) 0.7 Depth: (cm) 0.1 Area: (cm) 0.495 Volume: (cm) 0.049 % Reduction in Area: 86.9% % Reduction in Volume: 96.8% Epithelialization: Small (1-33%) Tunneling:  No Undermining: No Wound Description Classification: Full Thickness Without Exposed Su Exudate Amount: Medium Exudate Type: Serosanguineous Exudate Color: red, brown pport Structures Wound Bed Granulation Amount: Medium (34-66%) Exposed Structure Granulation Quality: Pink Fascia Exposed: No Necrotic Amount: Medium (34-66%) Fat Layer (Subcutaneous Tissue) Exposed: Yes Necrotic Quality: Eschar, Adherent Slough Tendon Exposed: No Muscle Exposed: No Joint Exposed: No Bone Exposed: No Periwound Skin Texture Texture Color No Abnormalities Noted: No No Abnormalities Noted: Yes Moisture No Abnormalities Noted: No Dry / Scaly: Yes Treatment Notes Wound #1 (Foot) Wound Laterality: Dorsal, Left Cleanser Peri-Wound Care Sween Lotion (Moisturizing lotion) Discharge Instruction: Apply moisturizing lotion as directed Topical Primary Dressing Promogran Prisma Matrix, 4.34 (sq in) (silver collagen) Discharge Instruction: Moisten collagen with saline or hydrogel Secondary Dressing Woven Gauze Sponge, Non-Sterile 4x4 in Discharge Instruction: Apply over primary dressing as directed. Secured With Compression Wrap ThreePress (3 layer compression wrap) Discharge Instruction: Apply three layer compression as directed. Compression Stockings Add-Ons Electronic Signature(s) Signed: 07/23/2022 12:15:24 PM By: Blanche East RN Pamalee Leyden, [name] ([id number]) [id number] Page 6 of 9 Entered By: Blanche East on 07/21/2022 12:59:03 -------------------------------------------------------------------------------- Wound Assessment Details Patient Name: Date of Service: LASTER, APPLING 07/21/2022 12:30 PM Medical Record Number: [id number] Patient Account Number: [id number] Date of Birth/Sex: Treating RN: [birth year] (71 y.o. [name] Primary Care Derrico Zhong: PA [name], [place] Other Clinician: Referring Hawley Pavia: Treating Ahaan Zobrist/Extender: [name]  in Treatment: 23 Wound Status Wound Number: 3 Primary Lymphedema Etiology: Wound Location: Right, Medial Lower Leg Wound Open Wounding Event: Gradually Appeared Status: Date Acquired: 12/07/2021 Comorbid Cataracts, Anemia, Lymphedema, Congestive Heart Failure, Deep Weeks Of Treatment: 23 History: Vein Thrombosis, Hypertension, Peripheral Venous Disease Clustered Wound: No Photos Wound Measurements Length: (cm) 3 Width: (cm) 3.5 Depth: (cm) 0.6 Area: (cm) 8.247 Volume: (cm) 4.948 % Reduction in Area: -54.4% % Reduction in Volume: -131.6% Epithelialization: Small (1-33%) Tunneling: No Undermining: No Wound Description Classification: Full Thickness With Exposed Suppo Wound Margin: Distinct, outline attached Exudate Amount: Large Exudate Type: Serosanguineous Exudate Color: red, brown rt Structures Foul Odor After Cleansing: Yes Due to Product Use: No Slough/Fibrino Yes Wound Bed Granulation Amount: Small (1-33%) Exposed Structure Granulation Quality: Hyper-granulation, Friable Fascia Exposed: No Necrotic Amount: Large (67-100%) Fat Layer (Subcutaneous Tissue) Exposed: Yes Necrotic Quality: Eschar, Adherent Slough Tendon Exposed: No Muscle Exposed: No Joint Exposed: No Bone Exposed: No Periwound Skin Texture Texture Color No Abnormalities Noted: Yes No Abnormalities Noted: No Erythema: No Moisture Hemosiderin Staining: Yes No Abnormalities Noted: Yes Temperature / Pain Temperature: No Abnormality Tenderness on PalpationMARLIN, JARRARD ([id number]) [id number] Page 7 of 9 Treatment Notes Wound #3 (Lower Leg) Wound Laterality: Right, Medial Cleanser Peri-Wound Care Sween Lotion (Moisturizing lotion) Discharge Instruction: Apply moisturizing lotion as directed Topical Gentamicin Discharge Instruction: As directed by physician Primary Dressing Santyl Ointment Discharge Instruction: Apply nickel thick amount to wound bed as  instructed Secondary Dressing ABD Pad, 8x10 Discharge Instruction: Apply over primary dressing as directed. Woven Gauze Sponge, Non-Sterile 4x4 in Discharge Instruction: Apply over primary dressing as directed. Zetuvit Plus 4x8 in Discharge Instruction: Apply over primary dressing as directed. Secured With Compression Wrap ThreePress (3 layer compression wrap) Discharge Instruction: Apply three layer compression as directed. Compression Stockings Add-Ons Electronic Signature(s) Signed: 07/23/2022 12:15:24 PM  By: Blanche East RN Entered By: Blanche East on 07/21/2022 12:59:39 -------------------------------------------------------------------------------- Wound Assessment Details Patient Name: Date of Service: LANDEN, KNOEDLER 07/21/2022 12:30 PM Medical Record Number: [id number] Patient Account Number: [id number] Date of Birth/Sex: Treating RN: 09/19/1950 (71 y.o. [name] Primary Care Kimm Ungaro: PA [name], [place] Other Clinician: Referring Iridessa Harrow: Treating Lyliana Dicenso/Extender: [name] in Treatment: 23 Wound Status Wound Number: 6 Primary Calcinosis Etiology: Wound Location: Right, Anterior Lower Leg Wound Open Wounding Event: Gradually Appeared Status: Date Acquired: 05/12/2022 Comorbid Cataracts, Anemia, Lymphedema, Congestive Heart Failure, Deep Weeks Of Treatment: 10 History: Vein Thrombosis, Hypertension, Peripheral Venous Disease Clustered Wound: No Wound Measurements Length: (cm) 0.3 Width: (cm) 0.2 Depth: (cm) 0.2 Area: (cm) 0.047 Volume: (cm) 0.009 % Reduction in Area: 80.1% % Reduction in Volume: 62.5% Epithelialization: None Tunneling: No Undermining: No Wound Description Classification: Full Thickness Without Exposed Support Structures Kopplin, Roman ([id number]) Wound Margin: Epibole Exudate Amount: Medium Exudate Type: Serosanguineous Exudate Color: red, brown Foul Odor After Cleansing:  No [id number] Page 8 of 9 Slough/Fibrino Yes Wound Bed Granulation Amount: Large (67-100%) Exposed Structure Granulation Quality: Red Fascia Exposed: No Necrotic Amount: Small (1-33%) Fat Layer (Subcutaneous Tissue) Exposed: Yes Necrotic Quality: Adherent Slough Tendon Exposed: No Muscle Exposed: No Joint Exposed: No Bone Exposed: No Periwound Skin Texture Texture Color No Abnormalities Noted: Yes No Abnormalities Noted: No Atrophie Blanche: No Moisture Cyanosis: No No Abnormalities Noted: Yes Ecchymosis: No Erythema: No Hemosiderin Staining: Yes Mottled: No Pallor: No Rubor: No Temperature / Pain Temperature: No Abnormality Tenderness on Palpation: Yes Treatment Notes Wound #6 (Lower Leg) Wound Laterality: Right, Anterior Cleanser Peri-Wound Care Sween Lotion (Moisturizing lotion) Discharge Instruction: Apply moisturizing lotion as directed Topical Primary Dressing Promogran Prisma Matrix, 4.34 (sq in) (silver collagen) Discharge Instruction: Moisten collagen with saline or hydrogel Secondary Dressing ABD Pad, 8x10 Discharge Instruction: Apply over primary dressing as directed. Woven Gauze Sponge, Non-Sterile 4x4 in Discharge Instruction: Apply over primary dressing as directed. Zetuvit Plus 4x8 in Discharge Instruction: Apply over primary dressing as directed. Secured With Compression Wrap ThreePress (3 layer compression wrap) Discharge Instruction: Apply three layer compression as directed. Compression Stockings Add-Ons Electronic Signature(s) Signed: 07/23/2022 12:15:24 PM By: Blanche East RN Entered By: Blanche East on 07/21/2022 12:56:30 [name] ([id number]) [id number] Page 9 of 9 -------------------------------------------------------------------------------- Vitals Details Patient Name: Date of Service: MONTEL, VANDERHOOF 07/21/2022 12:30 PM Medical Record Number: [id number] Patient Account  Number: [id number] Date of Birth/Sex: Treating RN: 1951/07/25 (71 y.o. [name] Primary Care Curstin Schmale: PA [name], [place] Other Clinician: Referring Yamaris Cummings: Treating Arvetta Araque/Extender: [name] in Treatment: 23 Vital Signs Time Taken: 12:44 Temperature (F): 98.0 Weight (lbs): 267 Pulse (bpm): 71 Respiratory Rate (breaths/min): 16 Blood Pressure (mmHg): 163/73 Reference Range: 80 - 120 mg / dl Electronic Signature(s) Signed: 07/23/2022 12:15:24 PM By: Blanche East RN Entered By: Blanche East on 07/21/2022 12:44:30

## 2022-07-23 NOTE — Progress Notes (Signed)
DARLY, FAILS ([id number]) [id number] Page 1 of 15 Visit Report for 07/21/2022 Chief Complaint Document Details Patient Name: Date of Service: [name], [name] 07/21/2022 12:30 PM Medical Record Number: [id number] Patient Account Number: [id number] Date of Birth/Sex: Treating RN: [birth year] (71 y.o. M) Primary Care Provider: PA [name], NO Other Clinician: Referring Provider: Treating Provider/Extender: [name] in Treatment: 23 Information Obtained from: Patient Chief Complaint Patient seen for complaints of Non-Healing Wounds. Electronic Signature(s) Signed: 07/21/2022 1:13:09 PM By: Fredirick Maudlin MD FACS Entered By: Fredirick Maudlin on 07/21/2022 13:13:09 -------------------------------------------------------------------------------- Debridement Details Patient Name: Date of Service: [name], [name] 07/21/2022 12:30 PM Medical Record Number: [id number] Patient Account Number: [id number] Date of Birth/Sex: Treating RN: 1951/07/03 (71 y.o. [name] Primary Care Provider: PA [name], [place] Other Clinician: Referring Provider: Treating Provider/Extender: [name] in Treatment: 23 Debridement Performed for Assessment: Wound #1 Left,Dorsal Foot Performed By: Physician Fredirick Maudlin, MD Debridement Type: Debridement Level of Consciousness (Pre-procedure): Awake and Alert Pre-procedure Verification/Time Out Yes - 13:04 Taken: Start Time: 13:05 Pain Control: Lidocaine 5% topical ointment T Area Debrided (L x W): otal 0.9 (cm) x 0.7 (cm) = 0.63 (cm) Tissue and other material debrided: Non-Viable, Eschar, Slough, Slough Level: Non-Viable Tissue Debridement Description: Selective/Open Wound Instrument: Curette Bleeding: Minimum Hemostasis Achieved: Pressure Response to Treatment: Procedure was tolerated well Level of Consciousness (Post- Awake and Alert procedure): Post Debridement Measurements  of Total Wound Length: (cm) 0.9 Width: (cm) 0.7 Depth: (cm) 0.1 Volume: (cm) 0.049 Character of Wound/Ulcer Post Debridement: Improved Post Procedure Diagnosis Same as [name] ([id number]) [id number] Page 2 of 15 Notes Scribed for Dr. [name] by Blanche East, RN Electronic Signature(s) Signed: 07/21/2022 3:07:18 PM By: Fredirick Maudlin MD FACS Signed: 07/23/2022 12:15:24 PM By: Blanche East RN Entered By: Blanche East on 07/21/2022 13:06:33 -------------------------------------------------------------------------------- Debridement Details Patient Name: Date of Service: [name], [name] 07/21/2022 12:30 PM Medical Record Number: [id number] Patient Account Number: [id number] Date of Birth/Sex: Treating RN: 29-May-1951 (71 y.o. [name] Primary Care Provider: PA [name], [place] Other Clinician: Referring Provider: Treating Provider/Extender: [name] in Treatment: 23 Debridement Performed for Assessment: Wound #6 Right,Anterior Lower Leg Performed By: Physician Fredirick Maudlin, MD Debridement Type: Debridement Level of Consciousness (Pre-procedure): Awake and Alert Pre-procedure Verification/Time Out Yes - 13:04 Taken: Start Time: 13:05 Pain Control: Lidocaine 5% topical ointment T Area Debrided (L x W): otal 0.3 (cm) x 0.2 (cm) = 0.06 (cm) Tissue and other material debrided: Non-Viable, Slough, Slough Level: Non-Viable Tissue Debridement Description: Selective/Open Wound Instrument: Curette Bleeding: Minimum Hemostasis Achieved: Pressure Response to Treatment: Procedure was tolerated well Level of Consciousness (Post- Awake and Alert procedure): Post Debridement Measurements of Total Wound Length: (cm) 0.3 Width: (cm) 0.2 Depth: (cm) 0.2 Volume: (cm) 0.009 Character of Wound/Ulcer Post Debridement: Requires Further Debridement Post Procedure Diagnosis Same as Pre-procedure Notes Scribed  for Dr. [name] by Blanche East, RN Electronic Signature(s) Signed: 07/21/2022 3:07:18 PM By: Fredirick Maudlin MD FACS Signed: 07/23/2022 12:15:24 PM By: Blanche East RN Entered By: Blanche East on 07/21/2022 13:09:00 -------------------------------------------------------------------------------- Debridement Details Patient Name: Date of Service: [name], [name] 07/21/2022 12:30 PM Medical Record Number: [id number] Patient Account Number: [id number] Date of Birth/Sex: Treating RN: Dec 23, 1950 (71 y.o. [name] Primary Care Provider: PA Darnelle Spangle Other Clinician: DEMAURION, DICIOCCIO ([id number]) [id number] Page 3 of 15 Referring Provider: Treating Provider/Extender: [name] in Treatment: 23 Debridement Performed for Assessment: Wound #3 Right,Medial Lower Leg Performed  By: Physician Fredirick Maudlin, MD Debridement Type: Debridement Level of Consciousness (Pre-procedure): Awake and Alert Pre-procedure Verification/Time Out Yes - 13:04 Taken: Start Time: 13:05 Pain Control: Lidocaine 5% topical ointment T Area Debrided (L x W): otal 3 (cm) x 3.5 (cm) = 10.5 (cm) Tissue and other material debrided: Viable, Non-Viable, Slough, Subcutaneous, Slough Level: Skin/Subcutaneous Tissue Debridement Description: Excisional Instrument: Curette Bleeding: Minimum Hemostasis Achieved: Pressure Response to Treatment: Procedure was tolerated well Level of Consciousness (Post- Awake and Alert procedure): Post Debridement Measurements of Total Wound Length: (cm) 3 Width: (cm) 3.5 Depth: (cm) 0.6 Volume: (cm) 4.948 Character of Wound/Ulcer Post Debridement: Requires Further Debridement Post Procedure Diagnosis Same as Pre-procedure Notes Scribed for Dr. [name] by Blanche East, RN Electronic Signature(s) Signed: 07/21/2022 3:07:18 PM By: Fredirick Maudlin MD FACS Signed: 07/23/2022 12:15:24 PM By: Blanche East RN Entered By: Blanche East on 07/21/2022 13:12:08 -------------------------------------------------------------------------------- HPI Details Patient Name: Date of Service: [name], [name] 07/21/2022 12:30 PM Medical Record Number: [id number] Patient Account Number: [id number] Date of Birth/Sex: Treating RN: [birth year] (71 y.o. M) Primary Care Provider: PA [name], NO Other Clinician: Referring Provider: Treating Provider/Extender: [name] in Treatment: 23 History of Present Illness HPI Description: ADMISSION 02/09/2022 This is a 71 year old man who recently moved to [place] from [place]. He has a history of of coronary artery disease and prior left popliteal vein DVT . He is not diabetic and does not smoke. 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 He has had multiple spinal surgeries and has limited mobility. He primarily uses a motorized wheelchair. He has had lymphedema for many years and does have lymphedema pumps although he says he does not use them frequently. He has wounds on his bilateral lower extremities. He was receiving care in [place] for these prior to his move. They were apparently packing his wounds with Betadine soaked gauze. He has worn compression wraps in the past but not recently. He did say that they helped tremendously. In clinic today, his right ABI is noncompressible but has good signals; the left ABI was 1.17. No formal vascular studies have been performed. On his left dorsal foot, there is a circular lesion that exposes the fat layer. There is fairly heavy slough accumulation within the wound, but no significant odor. On his right medial calf, he has multiple pinholes that have slough buildup in them and probe for about 2 to 4 mm in depth. They are draining clear fluid. On his right lateral midfoot, he has ulceration consistent with venous stasis. It is geographic and has slough accumulation. He has changes in his lower  extremities consistent with stasis dermatitis, but no hemosiderin deposition or thickening of the skin. 02/17/2022: All of the wounds are little bit larger today and have more slough accumulation. Today, the medial calf openings are larger and probe to something that feels calcified. There is odor coming from his wounds. His vascular studies are scheduled for August 9 and 10. 02-24-2022 upon evaluation today patient presents for follow-up concerning his ongoing issues here with his lower extremities. With that being said he does have significant problems with what appears to be cellulitis unfortunately the PCR culture that was sent last week got crushed by [employer] in transit. With that being said GILLERMO, POCH ([id number]) [id number] Page 4 of 15 we are going to reobtain a culture today although I am actually to do it on the right medial leg as this seems to be the most inflamed area  today where there is some questionable drainage as well. I am going to remove some of the calcium deposits which are loosening obtain a deeper culture from this area. Fortunately I do not see any evidence of active infection systemically which is good news but locally I think we are having a bigger issue here. He does have his appointment next Thursday with vascular. Patient has also been referred to [facility] for further evaluation and treatment of the calcinosis for possible sulfate injections. 03/04/2022: The culture that was performed last week grew out multiple species including Klebsiella, Morganella, and Pseudomonas aeruginosa. He had been prescribed Bactrim but this was not adequate to cover all of the species and levofloxacin was recommended. He completed the Bactrim but did not pick up the levofloxacin and begin taking it until yesterday. 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 He had arterial studies performed today. He does have evidence of peripheral arterial disease. Results copied here: +-------+-----------+-----------+------------+------------+ ABI/TBIT oday's ABIT oday's TBIPrevious ABIPrevious TBI +-------+-----------+-----------+------------+------------+ Right [place] 0.75    +-------+-----------+-----------+------------+------------+ Left [place] 0.57    +-------+-----------+-----------+------------+------------+ Arterial wall calcification precludes accurate ankle pressures and ABIs. Summary: Right: Resting right ankle-brachial index indicates noncompressible right lower extremity arteries. The right toe-brachial index is normal. Left: Resting left ankle-brachial index indicates noncompressible left lower extremity arteries. The left toe-brachial index is abnormal. He continues to have further tissue breakdown at the right medial calf and there is ample slough accumulation along with necrotic subcutaneous tissue. The left dorsal foot wound has built up slough, as well. The right medial foot wound is nearly closed with just a light layer of eschar over the surface. The right dorsal lateral foot wound has also accumulated a fair amount of slough and remains quite tender. 03/10/2022: He has been taking the prescribed levofloxacin and has about 3 days left of this. The erythema and induration on his right leg has improved. He continues to experience further breakdown of the right medial calf wound. There is extensive slough and debris accumulation there. There is heavy slough on his left dorsal foot wound, but no odor or purulent drainage. The right medial foot wound appears to be closed. The right dorsal lateral foot wound also has a fair amount of slough but it is less tender than at our last visit. He still does not have an appointment with dermatology. 03/22/2022: The right anterior tibial  wound has closed. The right lateral foot wound is nearly closed with just a little bit of slough. The left dorsal foot wound is smaller and continues to have some slough buildup but less so than at prior visits. There is good granulation tissue underlying the slough. Unfortunately the right medial calf wound continues to deteriorate. It has a foul odor today and a lot of necrotic tissue, the surrounding skin is also erythematous and moist. 03/30/2022: The right lateral foot wound continues to contract and just has a little bit of slough and eschar present. The left dorsal foot wound is also smaller with slough overlying good granulation tissue. The right medial calf wound actually looks quite a bit better today; there is no odor and there is much less necrotic tissue although some remains present. We have been using topical gentamicin and he has been taking oral Augmentin. 04/07/2022: The patient saw dermatology at [facility] last week. Unfortunately, it appears  that they did not read any of the documentation that was provided. 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 Overall, it was a highly unsatisfactory consultation. In the meantime, however, we were able to identify compounding pharmacy who could create a topical sodium thiosulfate ointment. The patient has that with him today. Both of the right foot wounds are now closed. The left dorsal foot wound has contracted but still has a layer of slough on the surface. The medial calf wounds on the right all look a little bit better. They still have heavy slough along with the chunks of calcium. Everything is stained purple. 04/14/2022: The wound on his dorsal left foot is a little bit  smaller again today. There is still slough accumulation on the surface. The medial calf wounds have quite a bit less slough accumulation today. His right leg, however, is warm and erythematous, concerning for cellulitis. 04/14/2022: He completed his course of Augmentin yesterday. The medial calf wound sites are much cleaner today and shallower. There is less fragmented calcium in the wounds. He has a lot of lymphedema fluid-like drainage from these wounds, but no purulent discharge or malodor. The wound on his dorsal left foot has also contracted and is shallower. There is a layer of slough over healthy granulation tissue. 05/05/2022: The left dorsal foot wound is smaller today with less slough and more granulation tissue. The right medial calf wounds are shallower, but have extensive slough and some fat necrosis. The drainage has diminished considerably, however. 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 05/12/2022: The left dorsal foot wound continues to contract and fill with granulation tissue. There is slough on the wound surface. The right medial calf wounds also continue to fill with healthy tissue, but there is remaining slough and fat necrosis present. He had a significant increase in his drainage this week and it was a bright yellow-green color. He also had a new wound open over his right anterior tibial surface associated with the spike of cutaneous calcium. The leg is more red, as well, concerning for infection. 05/19/2022: His culture returned positive for Pseudomonas. He is currently taking a course of oral levofloxacin. We have also been using topical gentamicin mixed in with his sodium thiosulfate. All of the wounds look better this week. 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 There is slough and nonviable subcutaneous tissue still  present. The new anterior tibial wound just has a light layer of slough. The dorsal foot wound also has some slough present and appears a little dry today. 05/26/2022: The right medial leg wound continues to improve. It is feeling with granulation tissue, but still accumulates a fairly thick layer of slough on the surface. The more recent anterior tibial wound has remained quite small, but also has some slough on the surface. The left dorsal foot wound has contracted somewhat and has perimeter epithelialization. He completed his oral levofloxacin. 06/02/2022: The left dorsal foot wound continues to improve significantly. There is just a little slough on the wound surface. The right medial leg wound had black drainage, but it looks like silver alginate was applied last week and I theorized  that silver sulfide was created with a combination of the silver alginate and the sodium thiosulfate. It did, however, have a bit of an odor to it and extensive slough accumulation. The small anterior tibial wound also had a bit of slough and nonviable subcutaneous tissue present. The skin of his leg was rather macerated, as well. 06/09/2022: The left dorsal foot wound is smaller again this week with just a light layer of slough on the surface. The right medial leg wound looks substantially better. The leg and periwound skin are no longer red and macerated. There is good granulation tissue forming with less slough and nonviable tissue. The anterior tibial wound just has a small amount of slough accumulation. [name], [name] ([id number]) [id number] Page 5 of 15 06/16/2022: The left dorsal foot wound continues to contract. His wrap slipped a little bit, however, and his foot is more swollen. The right medial leg wound continues to improve. The underlying tissue is more vital-appearing and there is less slough. He does have substantial drainage. The small anterior tibial wound has a bit of slough  accumulation. 06/23/2022: The left dorsal foot wound is about half the size as it was last week. There is just some light slough on the surface and some eschar buildup around the edges. The right anterior leg wound is small with a little slough on the surface. Unfortunately, the right medial leg wound deteriorated fairly substantially. It is malodorous with gray/green/black discoloration. 06/30/2022: The left dorsal foot wound continues to contract. It is nearly closed with just a light layer of slough present. The right anterior leg wound is about the same size and has a small amount of slough on the surface. The right medial leg wound looks a lot better than it did last week. The odor has abated and the tissue is less pulpy and necrotic-looking. There is still a fairly thick layer of slough present. 12/13; the left dorsal foot wound looks very healthy. There was no need to change the dressing here and no debridement. He has a small area on the right anterior lower leg apparently had not changed much in size. The area on the medial leg is the most problematic area this is large with some depth and a completely nonviable surface today. 07/14/2022: The left dorsal foot wound is smaller today with just a little slough and eschar present. The right anterior lower leg wound is about the same without any significant change. The medial leg wound has a black and green surface and is malodorous. No purulent drainage and the periwound skin is intact. Edema control is suboptimal. 07/21/2022: The dorsal foot wound continues to contract and is nearly closed with just a little bit of slough and eschar on the surface. The right anterior lower leg wound is shallower today but otherwise the dimensions are unchanged. The medial leg wound looks significantly better although it still has a nonviable surface; the odor and black/green surface has resolved. His culture came back with multiple species sensitive to Augmentin,  which was prescribed and a very low level of Pseudomonas, which should be handled by the topical gentamicin we have been using. Electronic Signature(s) Signed: 07/21/2022 1:14:40 PM By: Fredirick Maudlin MD FACS Entered By: Fredirick Maudlin on 07/21/2022 13:14:40 -------------------------------------------------------------------------------- Physical Exam Details Patient Name: Date of Service: [name], [name] 07/21/2022 12:30 PM Medical Record Number: [id number] Patient Account Number: [id number] Date of Birth/Sex: Treating RN: January 10, 1951 (71 y.o. M) Primary Care Provider: PA [name], NO Other Clinician: Referring Provider: Treating Provider/Extender: [name],  Jewel Baize, [name] in Treatment: 23 Constitutional He is hypertensive, but asymptomatic.. . . . No acute distress. Respiratory Normal work of breathing on room air. Notes 07/21/2022: The dorsal foot wound continues to contract and is nearly closed with just a little bit of slough and eschar on the surface. The right anterior lower leg wound is shallower today but otherwise the dimensions are unchanged. The medial leg wound looks significantly better although it still has a nonviable surface; the odor and black/green surface has resolved. Electronic Signature(s) Signed: 07/21/2022 1:15:32 PM By: Fredirick Maudlin MD FACS Entered By: Fredirick Maudlin on 07/21/2022 13:15:32 -------------------------------------------------------------------------------- Physician Orders Details Patient Name: Date of Service: [name], [name] 07/21/2022 12:30 PM Medical Record Number: [id number] Patient Account Number: [id number] Date of Birth/Sex: Treating RN: 1951/01/09 (71 y.o. [name] Primary Care Provider: PA [name], [place] Other Clinician: Referring Provider: Treating Provider/Extender: [name] in Treatment: [address] / Phone Orders: No [name] ([id number]) [id number] Page 6  of 15 Diagnosis Coding ICD-10 Coding Code Description [id number] 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d L97.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) I10 Essential (primary) hypertension I25.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 - Dr. [name] Rm 1 Return A ppointment in 2 weeks. - Dr. [name] Room 1 Return A Anesthetic Wound #1 Left,Dorsal Foot (In clinic) Topical Lidocaine 4% applied to wound bed Wound #3 Right,Medial Lower Leg (In clinic) Topical Lidocaine 4% applied to wound bed Wound #6 Right,Anterior Lower Leg (In clinic) Topical Lidocaine 4% applied to wound bed Edema Control - Lymphedema / SCD / Other Lymphedema Pumps. Use Lymphedema pumps on leg(s) 2-3 times a day for 45-60 minutes. If wearing any wraps or hose, do not remove them. Continue exercising as instructed. - use 1-2 times per day over wraps Exercise regularly Wound Treatment Wound #1 - Foot Wound Laterality: Dorsal, Left Peri-Wound Care: Sween Lotion (Moisturizing lotion) 1 x Per Week/30 Days Discharge Instructions: Apply moisturizing lotion as directed Prim Dressing: Promogran Prisma Matrix, 4.34 (sq in) (silver collagen) 1 x Per Week/30 Days ary Discharge Instructions: Moisten collagen with saline or hydrogel Secondary Dressing: Woven Gauze Sponge, Non-Sterile 4x4 in 1 x Per Week/30 Days Discharge Instructions: Apply over primary dressing as directed. Compression Wrap: ThreePress (3 layer compression wrap) 1 x Per Week/30 Days Discharge Instructions: Apply three layer compression as directed. Wound #3 - Lower Leg Wound Laterality: Right, Medial Peri-Wound Care:  Sween Lotion (Moisturizing lotion) 1 x Per Week/30 Days Discharge Instructions: Apply moisturizing lotion as directed Topical: Gentamicin 1 x Per Week/30 Days Discharge Instructions: As directed by physician Prim Dressing: Santyl Ointment 1 x Per Week/30 Days ary Discharge Instructions: Apply nickel thick amount to wound bed as instructed Secondary Dressing: ABD Pad, 8x10 1 x Per Week/30 Days Discharge Instructions: Apply over primary dressing as directed. Secondary Dressing: Woven Gauze Sponge, Non-Sterile 4x4 in 1 x Per Week/30 Days Discharge Instructions: Apply over primary dressing as directed. Secondary Dressing: Zetuvit Plus 4x8 in 1 x Per Week/30 Days Discharge Instructions: Apply over primary dressing as directed. Compression Wrap: ThreePress (3 layer compression wrap) 1 x Per Week/30 Days Discharge Instructions: Apply three layer compression as directed. Wound #6 - Lower Leg Wound Laterality: Right, Anterior  Peri-Wound Care: Sween Lotion (Moisturizing lotion) 1 x Per Week/30 Days Discharge Instructions: Apply moisturizing lotion as directed Prim Dressing: Promogran Prisma Matrix, 4.34 (sq in) (silver collagen) 1 x Per Week/30 Days ary Discharge Instructions: Moisten collagen with saline or hydrogel [name], [name] ([id number]) [id number] Page 7 of 15 Secondary Dressing: ABD Pad, 8x10 1 x Per Week/30 Days Discharge Instructions: Apply over primary dressing as directed. Secondary Dressing: Woven Gauze Sponge, Non-Sterile 4x4 in 1 x Per Week/30 Days Discharge Instructions: Apply over primary dressing as directed. Secondary Dressing: Zetuvit Plus 4x8 in 1 x Per Week/30 Days Discharge Instructions: Apply over primary dressing as directed. Compression Wrap: ThreePress (3 layer compression wrap) 1 x Per Week/30 Days Discharge Instructions: Apply three layer compression as directed. Electronic Signature(s) Signed: 07/21/2022 3:07:18 PM By: Fredirick Maudlin MD  FACS Entered By: Fredirick Maudlin on 07/21/2022 13:15:43 -------------------------------------------------------------------------------- Problem List Details Patient Name: Date of Service: [name], [name] 07/21/2022 12:30 PM Medical Record Number: [id number] Patient Account Number: [id number] Date of Birth/Sex: Treating RN: [birth year] (71 y.o. 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 Yes 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) 02/09/2022 No Yes I10 Essential (primary) hypertension 02/09/2022 No Yes I25.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 02/09/2022 No Yes I89.0 Lymphedema, not elsewhere classified 02/09/2022 No Yes Z86.718 Personal history of other venous thrombosis and embolism 02/09/2022 No Yes [name], [name] ([id number]) [id number] Page 8 of 15 Inactive Problems ICD-10 Code Description Active Date Inactive Date L97.512 Non-pressure chronic ulcer of other part of right foot with fat layer exposed 02/17/2022 02/17/2022 Resolved Problems Electronic Signature(s) Signed: 07/21/2022 1:12:55 PM By: Fredirick Maudlin MD FACS Entered By: Fredirick Maudlin on 07/21/2022 13:12:55 -------------------------------------------------------------------------------- Progress Note Details Patient Name: Date of Service: [name] 07/21/2022 12:30 PM Medical Record Number: [id number] Patient Account Number: [id number] Date of Birth/Sex: Treating RN: 1950/11/12 (71 y.o. M) Primary Care  Provider: PA [name], NO Other Clinician: Referring Provider: Treating Provider/Extender: [name] in Treatment: 23 Subjective Chief Complaint Information obtained from Patient Patient seen for complaints of Non-Healing Wounds. History of Present Illness (HPI) ADMISSION 02/09/2022 This is a 71 year old man who recently moved to [place] from [place]. He has a history of of coronary artery disease and prior left popliteal vein DVT . He is not diabetic and does not smoke. 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 He has had multiple spinal surgeries and has limited mobility. He primarily uses a motorized wheelchair. He has had lymphedema for many years and does have lymphedema pumps although he says he does not use them frequently. He has wounds on his bilateral lower extremities. He was receiving care in [place] for these prior to his move. They were apparently packing his wounds with Betadine soaked gauze. He has worn compression wraps in the past but not recently. He did say that they helped tremendously. In clinic today, his right ABI is noncompressible but has good signals; the left ABI was 1.17. No formal vascular studies have been performed. On his left dorsal foot, there is a circular lesion that exposes the fat layer. There is  fairly heavy slough accumulation within the wound, but no significant odor. On his right medial calf, he has multiple pinholes that have slough buildup in them and probe for about 2 to 4 mm in depth. They are draining clear fluid. On his right lateral midfoot, he has ulceration consistent with venous stasis. It is geographic and has slough accumulation. 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 02/17/2022: All of the wounds are little bit larger today and have more slough accumulation. Today, the medial calf openings are larger and  probe to something that feels calcified. There is odor coming from his wounds. His vascular studies are scheduled for August 9 and 10. 02-24-2022 upon evaluation today patient presents for follow-up concerning his ongoing issues here with his lower extremities. With that being said he does have significant problems with what appears to be cellulitis unfortunately the PCR culture that was sent last week got crushed by [employer] in transit. With that being said we are going to reobtain a culture today although I am actually to do it on the right medial leg as this seems to be the most inflamed area today where there is some questionable drainage as well. I am going to remove some of the calcium deposits which are loosening obtain a deeper culture from this area. Fortunately I do not see any evidence of active infection systemically which is good news but locally I think we are having a bigger issue here. He does have his appointment next Thursday with vascular. Patient has also been referred to [facility] for further evaluation and treatment of the calcinosis for possible sulfate injections. 03/04/2022: The culture that was performed last week grew out multiple species including Klebsiella, Morganella, and Pseudomonas aeruginosa. He had been prescribed Bactrim but this was not adequate to cover all of the species and levofloxacin was recommended. He completed the Bactrim but did not pick up the levofloxacin and begin taking it until yesterday. 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 He had arterial studies performed today. He does have evidence of peripheral arterial disease. Results copied here: +-------+-----------+-----------+------------+------------+ ABI/TBIT oday's ABIT oday's TBIPrevious ABIPrevious  TBI +-------+-----------+-----------+------------+------------+ Right [place] 0.75    +-------+-----------+-----------+------------+------------+ Left [place] 0.57    +-------+-----------+-----------+------------+------------+ Arterial wall calcification precludes accurate ankle pressures and ABIs. DJIBRIL, GLOGOWSKI ([id number]) [id number] Page 9 of 15 Summary: Right: Resting right ankle-brachial index indicates noncompressible right lower extremity arteries. The right toe-brachial index is normal. Left: Resting left ankle-brachial index indicates noncompressible left lower extremity arteries. The left toe-brachial index is abnormal. He continues to have further tissue breakdown at the right medial calf and there is ample slough accumulation along with necrotic subcutaneous tissue. The left dorsal foot wound has built up slough, as well. The right medial foot wound is nearly closed with just a light layer of eschar over the surface. The right dorsal lateral foot wound has also accumulated a fair amount of slough and remains quite tender. 03/10/2022: He has been taking the prescribed levofloxacin and has about 3 days left of this. The erythema and induration on his right leg has improved. He continues to experience further breakdown of the right medial calf wound. There is extensive slough and debris accumulation there. There is heavy slough on his left dorsal foot wound, but no odor or  purulent drainage. The right medial foot wound appears to be closed. The right dorsal lateral foot wound also has a fair amount of slough but it is less tender than at our last visit. He still does not have an appointment with dermatology. 03/22/2022: The right anterior tibial wound has closed. The right lateral foot wound is nearly closed with just a little bit of slough. The left dorsal foot wound is smaller and continues to have some slough buildup but less so than at prior visits. There  is good granulation tissue underlying the slough. Unfortunately the right medial calf wound continues to deteriorate. It has a foul odor today and a lot of necrotic tissue, the surrounding skin is also erythematous and moist. 03/30/2022: The right lateral foot wound continues to contract and just has a little bit of slough and eschar present. The left dorsal foot wound is also smaller with slough overlying good granulation tissue. The right medial calf wound actually looks quite a bit better today; there is no odor and there is much less necrotic tissue although some remains present. We have been using topical gentamicin and he has been taking oral Augmentin. 04/07/2022: The patient saw dermatology at [facility] last week. Unfortunately, it appears that they did not read any of the documentation that was provided. 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 Overall, it was a highly unsatisfactory consultation. In the meantime, however, we were able to identify compounding pharmacy who could create a topical sodium thiosulfate ointment. The patient has that with him today. Both of the right foot wounds are now closed. The left dorsal foot wound has contracted but still has a layer of slough on the surface. The medial calf wounds on the right all look a little bit better. They still have heavy slough along with the chunks of calcium. Everything is stained purple. 04/14/2022: The wound on his dorsal left foot is a little bit smaller again today. There is still slough accumulation on the surface. The medial calf wounds have quite a bit less slough accumulation today. His right leg, however, is warm and erythematous, concerning for  cellulitis. 04/14/2022: He completed his course of Augmentin yesterday. The medial calf wound sites are much cleaner today and shallower. There is less fragmented calcium in the wounds. He has a lot of lymphedema fluid-like drainage from these wounds, but no purulent discharge or malodor. The wound on his dorsal left foot has also contracted and is shallower. There is a layer of slough over healthy granulation tissue. 05/05/2022: The left dorsal foot wound is smaller today with less slough and more granulation tissue. The right medial calf wounds are shallower, but have extensive slough and some fat necrosis. The drainage has diminished considerably, however. 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 05/12/2022: The left dorsal foot wound continues to contract and fill with granulation tissue. There is slough on the wound surface. The right medial calf wounds also continue to fill with healthy tissue, but there is remaining slough and fat necrosis present. He had a significant increase in his drainage this week and it was a bright yellow-green color. He also had  a new wound open over his right anterior tibial surface associated with the spike of cutaneous calcium. The leg is more red, as well, concerning for infection. 05/19/2022: His culture returned positive for Pseudomonas. He is currently taking a course of oral levofloxacin. We have also been using topical gentamicin mixed in with his sodium thiosulfate. All of the wounds look better this week. 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 There is slough and nonviable subcutaneous tissue still present. The new anterior tibial wound just has a light layer of slough. The dorsal foot wound also has some slough present and appears a little dry today. 05/26/2022: The right medial leg wound continues to  improve. It is feeling with granulation tissue, but still accumulates a fairly thick layer of slough on the surface. The more recent anterior tibial wound has remained quite small, but also has some slough on the surface. The left dorsal foot wound has contracted somewhat and has perimeter epithelialization. He completed his oral levofloxacin. 06/02/2022: The left dorsal foot wound continues to improve significantly. There is just a little slough on the wound surface. The right medial leg wound had black drainage, but it looks like silver alginate was applied last week and I theorized that silver sulfide was created with a combination of the silver alginate and the sodium thiosulfate. It did, however, have a bit of an odor to it and extensive slough accumulation. The small anterior tibial wound also had a bit of slough and nonviable subcutaneous tissue present. The skin of his leg was rather macerated, as well. 06/09/2022: The left dorsal foot wound is smaller again this week with just a light layer of slough on the surface. The right medial leg wound looks substantially better. The leg and periwound skin are no longer red and macerated. There is good granulation tissue forming with less slough and nonviable tissue. The anterior tibial wound just has a small amount of slough accumulation. 06/16/2022: The left dorsal foot wound continues to contract. His wrap slipped a little bit, however, and his foot is more swollen. The right medial leg wound continues to improve. The underlying tissue is more vital-appearing and there is less slough. He does have substantial drainage. The small anterior tibial wound has a bit of slough accumulation. 06/23/2022: The left dorsal foot wound is about half the size as it was last week. There is just some light slough on the surface and some eschar buildup around the edges. The right anterior leg wound is small with a little slough on the surface. Unfortunately, the right  medial leg wound deteriorated fairly substantially. It is malodorous with gray/green/black discoloration. 06/30/2022: The left dorsal foot wound continues to contract. It is nearly closed with just a light layer of slough present. The right anterior leg wound is about the same size and has a small amount of slough on the surface. The right medial leg wound looks a lot better than it did last week. The odor has abated and the tissue is less pulpy and necrotic-looking. There is still a fairly thick layer of slough present. 12/13; the left dorsal foot wound looks very healthy. There was no need to change the dressing here and no debridement. He has a small area on the right anterior lower leg apparently had not changed much in size. The area on the medial leg is the most problematic area this is large with some depth and a completely nonviable surface today. 07/14/2022: The left dorsal foot wound  is smaller today with just a little slough and eschar present. The right anterior lower leg wound is about the same without any significant change. The medial leg wound has a black and green surface and is malodorous. No purulent drainage and the periwound skin is intact. Edema control is suboptimal. 07/21/2022: The dorsal foot wound continues to contract and is nearly closed with just a little bit of slough and eschar on the surface. The right anterior lower leg wound is shallower today but otherwise the dimensions are unchanged. The medial leg wound looks significantly better although it still has a nonviable surface; the odor and black/green surface has resolved. His culture came back with multiple species sensitive to Augmentin, which was prescribed and a very low level of Pseudomonas, which should be handled by the topical gentamicin we have been using. KELON, EASOM ([id number]) [id number] Page 10 of 15 Patient History Information obtained from Patient, Chart. Family  History Cancer - Mother, Heart Disease - Father,Mother, Hypertension - Father,Siblings, Lung Disease - Siblings, No family history of Diabetes, Hereditary Spherocytosis, Kidney Disease, Seizures, Stroke, Thyroid Problems, Tuberculosis. Social History Former smoker - quit 1991, Marital Status - Married, Alcohol Use - Moderate, Drug Use - No History, Caffeine Use - Daily - coffee. Medical History Eyes Patient has history of Cataracts - right eye removed Denies history of Glaucoma, Optic Neuritis Ear/Nose/Mouth/Throat Denies history of Chronic sinus problems/congestion, Middle ear problems Hematologic/Lymphatic Patient has history of Anemia - macrocytic, Lymphedema Cardiovascular Patient has history of Congestive Heart Failure, Deep Vein Thrombosis - left leg, Hypertension, Peripheral Venous Disease Endocrine Denies history of Type I Diabetes, Type II Diabetes Genitourinary Denies history of End Stage Renal Disease Integumentary (Skin) Denies history of History of Burn Oncologic Denies history of Received Chemotherapy, Received Radiation Psychiatric Denies history of Anorexia/bulimia, Confinement Anxiety Hospitalization/Surgery History - cervical fusion. - lumbar surgery. - coronary stent placement. - lasik eye surgery. - hydrocele excision. Medical A Surgical History Notes nd Constitutional Symptoms (General Health) obesity Ear/Nose/Mouth/Throat hard of hearing Respiratory frozen diaphragm right Cardiovascular hyperlipidemia Musculoskeletal myositis, lumbar DDD, spinal stenosis, cervical facet joint syndrome Objective Constitutional He is hypertensive, but asymptomatic.Marland Kitchen No acute distress. Vitals Time Taken: 12:44 PM, Weight: 267 lbs, Temperature: 98.0 F, Pulse: 71 bpm, Respiratory Rate: 16 breaths/min, Blood Pressure: 163/73 mmHg. Respiratory Normal work of breathing on room air. General Notes: 07/21/2022: The dorsal foot wound continues to contract and is nearly closed  with just a little bit of slough and eschar on the surface. The right anterior lower leg wound is shallower today but otherwise the dimensions are unchanged. The medial leg wound looks significantly better although it still has a nonviable surface; the odor and black/green surface has resolved. Integumentary (Hair, Skin) Wound #1 status is Open. Original cause of wound was Gradually Appeared. The date acquired was: 12/07/2021. The wound has been in treatment 23 weeks. The wound is located on the Left,Dorsal Foot. The wound measures 0.9cm length x 0.7cm width x 0.1cm depth; 0.495cm^2 area and 0.049cm^3 volume. There is Fat Layer (Subcutaneous Tissue) exposed. There is no tunneling or undermining noted. There is a medium amount of serosanguineous drainage noted. There is medium (34-66%) pink granulation within the wound bed. There is a medium (34-66%) amount of necrotic tissue within the wound bed including Eschar and Adherent Slough. The periwound skin appearance had no abnormalities noted for color. The periwound skin appearance exhibited: Dry/Scaly. Wound #3 status is Open. Original cause of wound was Gradually Appeared. The date acquired was: 12/07/2021.  The wound has been in treatment 23 weeks. The wound is located on the Right,Medial Lower Leg. The wound measures 3cm length x 3.5cm width x 0.6cm depth; 8.247cm^2 area and 4.948cm^3 volume. There is Fat Layer (Subcutaneous Tissue) exposed. There is no tunneling or undermining noted. There is a large amount of serosanguineous drainage noted. Foul odor after cleansing was noted. The wound margin is distinct with the outline attached to the wound base. There is small (1-33%) friable, hyper - granulation within the wound bed. There is a large (67-100%) amount of necrotic tissue within the wound bed including Eschar and Adherent Slough. The periwound skin appearance had no abnormalities noted for texture. The periwound skin appearance had no abnormalities  noted for moisture. The periwound skin appearance exhibited: Hemosiderin Staining. The periwound skin appearance did not exhibit: Erythema. Periwound temperature was noted as No Abnormality. The periwound has tenderness on palpation. Wound #6 status is Open. Original cause of wound was Gradually Appeared. The date acquired was: 05/12/2022. The wound has been in treatment 10 weeks. The wound is located on the Right,Anterior Lower Leg. The wound measures 0.3cm length x 0.2cm width x 0.2cm depth; 0.047cm^2 area and 0.009cm^3 volume. There is Fat Layer (Subcutaneous Tissue) exposed. There is no tunneling or undermining noted. There is a medium amount of serosanguineous ABHISHEK, LEVESQUE ([id number]) [id number] Page 11 of 15 drainage noted. The wound margin is epibole. There is large (67-100%) red granulation within the wound bed. There is a small (1-33%) amount of necrotic tissue within the wound bed including Adherent Slough. The periwound skin appearance had no abnormalities noted for texture. The periwound skin appearance had no abnormalities noted for moisture. The periwound skin appearance exhibited: Hemosiderin Staining. The periwound skin appearance did not exhibit: Atrophie Blanche, Cyanosis, Ecchymosis, Mottled, Pallor, Rubor, Erythema. Periwound temperature was noted as No Abnormality. The periwound has tenderness on palpation. 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 There was a Selective/Open Wound Non-Viable Tissue Debridement with a total area of 0.63 sq cm performed by Fredirick Maudlin, MD. With the following instrument(s): Curette to remove Non-Viable tissue/material. Material removed includes Eschar and Slough and after achieving pain control using Lidocaine 5% topical ointment. No specimens were taken. A time out was conducted at 13:04, prior to the start of the procedure. A Minimum amount of bleeding was controlled with Pressure. The procedure was tolerated well. Post Debridement Measurements: 0.9cm length x 0.7cm width x 0.1cm depth; 0.049cm^3 volume. Character of Wound/Ulcer Post Debridement is improved. Post procedure Diagnosis Wound #1: Same as Pre-Procedure General Notes: Scribed for Dr. [name] by Blanche East, RN. Wound #3 Pre-procedure diagnosis of Wound #3 is a Lymphedema located on the Right,Medial Lower Leg . There was a Excisional Skin/Subcutaneous Tissue Debridement with a total area of 10.5 sq cm performed by Fredirick Maudlin, MD. With the following instrument(s): Curette to remove Viable and Non-Viable tissue/material. Material removed includes Subcutaneous Tissue and Slough and after achieving pain control using Lidocaine 5% topical ointment. No specimens were taken. A time out was conducted at 13:04, prior to the start of the procedure.  A Minimum amount of bleeding was controlled with Pressure. The procedure was tolerated well. Post Debridement Measurements: 3cm length x 3.5cm width x 0.6cm depth; 4.948cm^3 volume. Character of Wound/Ulcer Post Debridement requires further debridement. Post procedure Diagnosis Wound #3: Same as Pre-Procedure General Notes: Scribed for Dr. [name] by Blanche East, RN. Wound #6 Pre-procedure diagnosis of Wound #6 is a Calcinosis located on the Right,Anterior Lower Leg . There was a Selective/Open Wound Non-Viable Tissue Debridement with a total area of 0.06 sq cm performed by Fredirick Maudlin, MD. With the following instrument(s): Curette to remove Non-Viable tissue/material. Material removed includes [facility] after achieving pain control using Lidocaine 5% topical ointment. No specimens were taken. A time out was conducted at 13:04, prior to the start of the procedure. A Minimum amount of bleeding was controlled with Pressure. The procedure was tolerated well. Post Debridement Measurements: 0.3cm length x 0.2cm width x 0.2cm depth; 0.009cm^3 volume. Character of Wound/Ulcer Post Debridement requires further debridement. Post procedure Diagnosis Wound #6: Same as Pre-Procedure General Notes: Scribed for Dr. [name] by Blanche East, RN. Plan Follow-up Appointments: Return Appointment in 1 week. - Dr. [name] Rm 1 Return Appointment in 2 weeks. - Dr. [name] Room 1 Anesthetic: Wound #1 Left,Dorsal Foot: (In clinic) Topical Lidocaine 4% applied to wound bed Wound #3 Right,Medial Lower Leg: (In clinic) Topical Lidocaine 4% applied to wound bed Wound #6 Right,Anterior Lower Leg: (In clinic) Topical Lidocaine 4% applied to wound bed Edema Control - Lymphedema / SCD / Other: Lymphedema Pumps. Use Lymphedema pumps on leg(s) 2-3 times a day for 45-60 minutes. If wearing any wraps or hose, do not remove them. Continue exercising as instructed. - use 1-2 times per day over wraps Exercise regularly WOUND #1: - Foot Wound Laterality: Dorsal, Left Peri-Wound Care: Sween Lotion (Moisturizing lotion) 1 x Per Week/30 Days Discharge Instructions: Apply moisturizing lotion as directed Prim Dressing: Promogran Prisma Matrix, 4.34 (sq in) (silver collagen) 1 x Per Week/30 Days ary Discharge Instructions: Moisten collagen with saline or hydrogel Secondary Dressing: Woven Gauze Sponge, Non-Sterile 4x4 in 1 x Per Week/30 Days Discharge Instructions: Apply over primary dressing as directed. Com pression Wrap: ThreePress (3 layer compression wrap) 1 x Per Week/30 Days ZANDER, INGHAM ([id number])  [id number] Page 12 of 15 Discharge Instructions: Apply three layer compression as directed. WOUND #3: - Lower Leg Wound Laterality: Right, Medial Peri-Wound Care: Sween Lotion (Moisturizing lotion) 1 x Per Week/30 Days Discharge Instructions: Apply moisturizing lotion as directed Topical: Gentamicin 1 x Per Week/30 Days Discharge Instructions: As directed by physician Prim Dressing: Santyl Ointment 1 x Per Week/30 Days ary Discharge Instructions: Apply nickel thick amount to wound bed as instructed Secondary Dressing: ABD Pad, 8x10 1 x Per Week/30 Days Discharge Instructions: Apply over primary dressing as directed. Secondary Dressing: Woven Gauze Sponge, Non-Sterile 4x4 in 1 x Per Week/30 Days Discharge Instructions: Apply over primary dressing as directed. Secondary Dressing: Zetuvit Plus 4x8 in 1 x Per Week/30 Days Discharge Instructions: Apply over primary dressing as directed. Com pression Wrap: ThreePress (3 layer compression wrap) 1 x Per Week/30 Days Discharge Instructions: Apply three layer compression as directed. WOUND #6: - Lower Leg Wound Laterality: Right, Anterior Peri-Wound Care: Sween Lotion (Moisturizing lotion) 1 x Per Week/30 Days Discharge Instructions: Apply moisturizing lotion as directed Prim Dressing: Promogran Prisma Matrix, 4.34 (sq in) (silver collagen) 1 x Per Week/30 Days ary Discharge Instructions: Moisten collagen with saline or hydrogel Secondary Dressing: ABD Pad, 8x10 1 x Per Week/30 Days Discharge Instructions:  Apply over primary dressing as directed. Secondary Dressing: Woven Gauze Sponge, Non-Sterile 4x4 in 1 x Per Week/30 Days Discharge Instructions: Apply over primary dressing as directed. Secondary Dressing: Zetuvit Plus 4x8 in 1 x Per Week/30 Days Discharge Instructions: Apply over primary dressing as directed. Com pression Wrap: ThreePress (3 layer compression wrap) 1 x Per Week/30 Days Discharge Instructions:  Apply three layer compression as directed. 07/21/2022: The dorsal foot wound continues to contract and is nearly closed with just a little bit of slough and eschar on the surface. The right anterior lower leg wound is shallower today but otherwise the dimensions are unchanged. The medial leg wound looks significantly better although it still has a nonviable surface; the odor and black/green surface has resolved. I used a curette to debride slough and eschar from the dorsal foot wound, slough from the anterior leg wound, and slough and nonviable subcutaneous tissue from the medial leg wound. We will continue the mixture of Santyl and topical gentamicin to the medial leg wound. Continue Prisma silver collagen to the other wounds. He will complete the course of oral Augmentin that I prescribed. Follow-up in 1 week. Electronic Signature(s) Signed: 07/21/2022 1:16:58 PM By: Fredirick Maudlin MD FACS Entered By: Fredirick Maudlin on 07/21/2022 13:16:58 -------------------------------------------------------------------------------- HxROS Details Patient Name: Date of Service: RASHIED, CORALLO [name] 07/21/2022 12:30 PM Medical Record Number: [id number] Patient Account Number: [id number] Date of Birth/Sex: Treating RN: Aug 18, 1950 (71 y.o. M) Primary Care Provider: PA [name], NO Other Clinician: Referring Provider: Treating Provider/Extender: [name] in Treatment: 23 Information Obtained From Patient Chart Constitutional Symptoms (General Health) Medical History: Past Medical History Notes: obesity Eyes Medical History: Positive for: Cataracts - right eye removed Negative for: Glaucoma; Optic Neuritis Ear/Nose/Mouth/Throat Medical History: Negative for: Chronic sinus problems/congestion; Middle ear problems TREBOR, GALDAMEZ ([id number]) [id number] Page 13 of 15 Past Medical History Notes: hard of hearing Hematologic/Lymphatic Medical  History: Positive for: Anemia - macrocytic; Lymphedema Respiratory Medical History: Past Medical History Notes: frozen diaphragm right Cardiovascular Medical History: Positive for: Congestive Heart Failure; Deep Vein Thrombosis - left leg; Hypertension; Peripheral Venous Disease Past Medical History Notes: hyperlipidemia Endocrine Medical History: Negative for: Type I Diabetes; Type II Diabetes Genitourinary Medical History: Negative for: End Stage Renal Disease Integumentary (Skin) Medical History: Negative for: History of Burn Musculoskeletal Medical History: Past Medical History Notes: myositis, lumbar DDD, spinal stenosis, cervical facet joint syndrome Oncologic Medical History: Negative for: Received Chemotherapy; Received Radiation Psychiatric Medical History: Negative for: Anorexia/bulimia; Confinement Anxiety HBO Extended History Items Eyes: Cataracts Immunizations Pneumococcal Vaccine: Received Pneumococcal Vaccination: Yes Received Pneumococcal Vaccination On or After 60th Birthday: Yes Implantable Devices No devices added Hospitalization / Surgery History Type of Hospitalization/Surgery cervical fusion lumbar surgery coronary stent placement lasik eye surgery hydrocele excision Family and Social History Cancer: Yes - Mother; Diabetes: No; Heart Disease: Yes - Father,Mother; Hereditary Spherocytosis: No; Hypertension: Yes - Father,Siblings; Kidney Disease: No; Lung Disease: Yes - Siblings; Seizures: No; Stroke: No; Thyroid Problems: No; Tuberculosis: No; Former smoker - quit 1991; Marital Status - Married; Alcohol Use: Moderate; Drug Use: No History; Caffeine Use: Daily - coffee; Financial Concerns: No; Food, Clothing or Shelter Needs: No; Support SHAMELL, SUAREZ ([id number]) [id number] Page 14 of 15 System Lacking: No; Transportation Concerns: No Electronic Signature(s) Signed: 07/21/2022 3:07:18 PM By: Fredirick Maudlin MD  FACS Entered By: Fredirick Maudlin on 07/21/2022 13:15:04 -------------------------------------------------------------------------------- SuperBill Details Patient Name: Date of Service: FITZROY, MIKAMI 07/21/2022 Medical Record Number: [id number] Patient Account Number: [id number] Date of Birth/Sex:  Treating RN: [birth year] (71 y.o. 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) I10 Essential (primary) hypertension I25.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 I89.0 Lymphedema, not elsewhere classified Z86.718 Personal history of other venous thrombosis and embolism Facility Procedures : CPT4 Code: 10071219 Description: [place] - DEB SUBQ TISSUE 20 SQ CM/< ICD-10 Diagnosis Description 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 Modifier: ed Quantity: 1 : CPT4 Code: 75883254 Description: 98264 - DEBRIDE WOUND 1ST 20 SQ CM OR < ICD-10 Diagnosis Description 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 L97.522 Non-pressure chronic ulcer of other part of left foot with fat layer  exposed Modifier: ed Quantity: 1 Physician Procedures : CPT4 Code Description Modifier [id number] - WC PHYS LEVEL 4 - EST PT 25 ICD-10 Diagnosis Description 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d L97.522 Non-pressure chronic ulcer of other part of left  foot with fat layer exposed 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.0  Lymphedema, not elsewhere classified Quantity: 1 : [id number] - WC PHYS SUBQ TISS 20 SQ CM ICD-10 Diagnosis Description 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

## 2022-07-28 ENCOUNTER — Other Ambulatory Visit: Payer: Self-pay | Admitting: Internal Medicine

## 2022-07-28 ENCOUNTER — Encounter (HOSPITAL_BASED_OUTPATIENT_CLINIC_OR_DEPARTMENT_OTHER): Payer: Medicare Other | Attending: General Surgery | Admitting: General Surgery

## 2022-07-28 DIAGNOSIS — I890 Lymphedema, not elsewhere classified: Secondary | ICD-10-CM | POA: Insufficient documentation

## 2022-07-28 DIAGNOSIS — L942 Calcinosis cutis: Secondary | ICD-10-CM | POA: Insufficient documentation

## 2022-07-28 DIAGNOSIS — L97812 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d: Secondary | ICD-10-CM | POA: Diagnosis not present

## 2022-07-28 DIAGNOSIS — I509 Heart failure, unspecified: Secondary | ICD-10-CM | POA: Diagnosis not present

## 2022-07-28 DIAGNOSIS — I872 Venous insufficiency (chronic) (peripheral): Secondary | ICD-10-CM | POA: Insufficient documentation

## 2022-07-28 DIAGNOSIS — I251 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: Secondary | ICD-10-CM | POA: Insufficient documentation

## 2022-07-28 DIAGNOSIS — Z86718 Personal history of other venous thrombosis and embolism: Secondary | ICD-10-CM | POA: Insufficient documentation

## 2022-07-28 DIAGNOSIS — I110 Hypertensive heart disease with heart failure: Secondary | ICD-10-CM | POA: Insufficient documentation

## 2022-07-28 DIAGNOSIS — L97522 Non-pressure chronic ulcer of other part of left foot with fat layer exposed: Secondary | ICD-10-CM | POA: Diagnosis not present

## 2022-07-28 DIAGNOSIS — I82499 Acute embolism and thrombosis of other specified deep vein of unspecified lower extremity: Secondary | ICD-10-CM

## 2022-07-28 NOTE — Telephone Encounter (Signed)
Prescription refill request for Eliquis received. Indication: DVT  Last office visit: 04/05/22 Harrington Challenger)  Scr: 0.84 (04/05/22)  Age: 72 Weight: 125.7kg  Refill sent.

## 2022-07-29 NOTE — Progress Notes (Signed)
LEIGH, KAEDING ([id number]) [id number] Page 1 of 15 Visit Report for 07/28/2022 Chief Complaint Document Details Patient Name: Date of Service: [name], [name] 07/28/2022 12:30 PM Medical Record Number: [id number] Patient Account Number: [id number] Date of Birth/Sex: Treating RN: 01/09/1951 (72 y.o. M) Primary Care Provider: PA [name], NO Other Clinician: Referring Provider: Treating Provider/Extender: [name] in Treatment: 24 Information Obtained from: Patient Chief Complaint Patient seen for complaints of Non-Healing Wounds. Electronic Signature(s) Signed: 07/28/2022 1:25:02 PM By: Fredirick Maudlin MD FACS Entered By: Fredirick Maudlin on 07/28/2022 13:25:02 -------------------------------------------------------------------------------- Debridement Details Patient Name: Date of Service: [name], [name] 07/28/2022 12:30 PM Medical Record Number: [id number] Patient Account Number: [id number] Date of Birth/Sex: Treating RN: 1950/09/23 (72 y.o. [name] Primary Care Provider: PA [name], [place] Other Clinician: Referring Provider: Treating Provider/Extender: [name] in Treatment: 24 Debridement Performed for Assessment: Wound #1 Left,Dorsal Foot Performed By: Physician Fredirick Maudlin, MD Debridement Type: Debridement Level of Consciousness (Pre-procedure): Awake and Alert Pre-procedure Verification/Time Out Yes - 12:56 Taken: Start Time: 12:57 Pain Control: Lidocaine 5% topical ointment T Area Debrided (L x W): otal 0.6 (cm) x 0.3 (cm) = 0.18 (cm) Tissue and other material debrided: Non-Viable, Eschar, Slough, Slough Level: Non-Viable Tissue Debridement Description: Selective/Open Wound Instrument: Curette Bleeding: Minimum Hemostasis Achieved: Pressure Response to Treatment: Procedure was tolerated well Level of Consciousness (Post- Awake and Alert procedure): Post Debridement Measurements of Total  Wound Length: (cm) 0.6 Width: (cm) 0.3 Depth: (cm) 0.1 Volume: (cm) 0.014 Character of Wound/Ulcer Post Debridement: Improved Post Procedure Diagnosis Same as BENNET, KUJAWA ([id number]) [id number] Page 2 of 15 Notes Scribed for Dr. [name] by Blanche East, RN Electronic Signature(s) Signed: 07/28/2022 3:50:54 PM By: Fredirick Maudlin MD FACS Signed: 07/29/2022 4:07:58 PM By: Blanche East RN Entered By: Blanche East on 07/28/2022 12:59:11 -------------------------------------------------------------------------------- Debridement Details Patient Name: Date of Service: [name], [name] 07/28/2022 12:30 PM Medical Record Number: [id number] Patient Account Number: [id number] Date of Birth/Sex: Treating RN: 1950/12/30 (72 y.o. [name] Primary Care Provider: PA [name], [place] Other Clinician: Referring Provider: Treating Provider/Extender: [name] in Treatment: 24 Debridement Performed for Assessment: Wound #6 Right,Anterior Lower Leg Performed By: Physician Fredirick Maudlin, MD Debridement Type: Debridement Level of Consciousness (Pre-procedure): Awake and Alert Pre-procedure Verification/Time Out Yes - 12:56 Taken: Start Time: 12:57 Pain Control: Lidocaine 5% topical ointment T Area Debrided (L x W): otal 0.3 (cm) x 0.2 (cm) = 0.06 (cm) Tissue and other material debrided: Non-Viable, Slough, Slough Level: Non-Viable Tissue Debridement Description: Selective/Open Wound Instrument: Curette Bleeding: Minimum Hemostasis Achieved: Pressure Response to Treatment: Procedure was tolerated well Level of Consciousness (Post- Awake and Alert procedure): Post Debridement Measurements of Total Wound Length: (cm) 0.3 Width: (cm) 0.2 Depth: (cm) 0.4 Volume: (cm) 0.019 Character of Wound/Ulcer Post Debridement: Requires Further Debridement Post Procedure Diagnosis Same as Pre-procedure Notes Scribed for Dr. [name] by  Blanche East, RN Electronic Signature(s) Signed: 07/28/2022 3:50:54 PM By: Fredirick Maudlin MD FACS Signed: 07/29/2022 4:07:58 PM By: Blanche East RN Entered By: Blanche East on 07/28/2022 13:00:09 -------------------------------------------------------------------------------- Debridement Details Patient Name: Date of Service: [name], [name] 07/28/2022 12:30 PM Medical Record Number: [id number] Patient Account Number: [id number] Date of Birth/Sex: Treating RN: 07-May-1951 (72 y.o. [name] Primary Care Provider: PA Darnelle Spangle Other Clinician: SANDIP, POWER ([id number]) [id number] Page 3 of 15 Referring Provider: Treating Provider/Extender: Valeria Batman Weeks in Treatment: 24 Debridement Performed for Assessment: Wound #3 Right,Medial Lower Leg Performed  By: Physician Fredirick Maudlin, MD Debridement Type: Debridement Level of Consciousness (Pre-procedure): Awake and Alert Pre-procedure Verification/Time Out Yes - 12:56 Taken: Start Time: 12:57 Pain Control: Lidocaine 5% topical ointment T Area Debrided (L x W): otal 3 (cm) x 4.5 (cm) = 13.5 (cm) Tissue and other material debrided: Viable, Non-Viable, Slough, Subcutaneous, Slough Level: Skin/Subcutaneous Tissue Debridement Description: Excisional Instrument: Curette Bleeding: Minimum Hemostasis Achieved: Pressure Response to Treatment: Procedure was tolerated well Level of Consciousness (Post- Awake and Alert procedure): Post Debridement Measurements of Total Wound Length: (cm) 3.1 Width: (cm) 4.5 Depth: (cm) 0.4 Volume: (cm) 4.383 Character of Wound/Ulcer Post Debridement: Improved Post Procedure Diagnosis Same as Pre-procedure Notes Scribed for Dr. [name] by Blanche East, RN Electronic Signature(s) Signed: 07/28/2022 3:50:54 PM By: Fredirick Maudlin MD FACS Signed: 07/29/2022 4:07:58 PM By: Blanche East RN Entered By: Blanche East on 07/28/2022  13:02:45 -------------------------------------------------------------------------------- HPI Details Patient Name: Date of Service: [name], [name] 07/28/2022 12:30 PM Medical Record Number: [id number] Patient Account Number: [id number] Date of Birth/Sex: Treating RN: May 25, 1951 (72 y.o. M) Primary Care Provider: PA [name], NO Other Clinician: Referring Provider: Treating Provider/Extender: [name] in Treatment: 24 History of Present Illness HPI Description: ADMISSION 02/09/2022 This is a 72 year old man who recently moved to [place] from [place]. He has a history of of coronary artery disease and prior left popliteal vein DVT . He is not diabetic and does not smoke. 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 He has had multiple spinal surgeries and has limited mobility. He primarily uses a motorized wheelchair. He has had lymphedema for many years and does have lymphedema pumps although he says he does not use them frequently. He has wounds on his bilateral lower extremities. He was receiving care in [place] for these prior to his move. They were apparently packing his wounds with Betadine soaked gauze. He has worn compression wraps in the past but not recently. He did say that they helped tremendously. In clinic today, his right ABI is noncompressible but has good signals; the left ABI was 1.17. No formal vascular studies have been performed. On his left dorsal foot, there is a circular lesion that exposes the fat layer. There is fairly heavy slough accumulation within the wound, but no significant odor. On his right medial calf, he has multiple pinholes that have slough buildup in them and probe for about 2 to 4 mm in depth. They are draining clear fluid. On his right lateral midfoot, he has ulceration consistent with venous stasis. It is geographic and has slough accumulation. 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 02/17/2022: All of the wounds are little bit larger today and have more slough accumulation. Today, the medial calf openings are larger and probe to something that feels calcified. There is odor coming from his wounds. His vascular studies are scheduled for August 9 and 10. 02-24-2022 upon evaluation today patient presents for follow-up concerning his ongoing issues here with his lower extremities. With that being said he does have significant problems with what appears to be cellulitis unfortunately the PCR culture that was sent last week got crushed by [employer] in transit. With that being said AENEAS, LONGSWORTH ([id number]) [id number] Page 4 of 15 we are going to reobtain a culture today although I am actually to do it on the right medial leg as this seems to be the most inflamed area today where  there is some questionable drainage as well. I am going to remove some of the calcium deposits which are loosening obtain a deeper culture from this area. Fortunately I do not see any evidence of active infection systemically which is good news but locally I think we are having a bigger issue here. He does have his appointment next Thursday with vascular. Patient has also been referred to [facility] for further evaluation and treatment of the calcinosis for possible sulfate injections. 03/04/2022: The culture that was performed last week grew out multiple species including Klebsiella, Morganella, and Pseudomonas aeruginosa. He had been prescribed Bactrim but this was not adequate to cover all of the species and levofloxacin was recommended. He completed the Bactrim but did not pick up the levofloxacin and begin taking it until yesterday. 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 He had arterial studies performed today. He does have evidence of peripheral arterial disease. Results copied here: +-------+-----------+-----------+------------+------------+ ABI/TBIT oday's ABIT oday's TBIPrevious ABIPrevious TBI +-------+-----------+-----------+------------+------------+ Right [place] 0.75    +-------+-----------+-----------+------------+------------+ Left [place] 0.57    +-------+-----------+-----------+------------+------------+ Arterial wall calcification precludes accurate ankle pressures and ABIs. Summary: Right: Resting right ankle-brachial index indicates noncompressible right lower extremity arteries. The right toe-brachial index is normal. Left: Resting left ankle-brachial index indicates noncompressible left lower extremity arteries. The left toe-brachial index is abnormal. He continues to have further tissue breakdown at the right medial calf and there is ample slough accumulation along with necrotic subcutaneous tissue. The left dorsal foot wound has built up slough, as well. The right medial foot wound is nearly closed with just a light layer of eschar over the surface. The right dorsal lateral foot wound has also accumulated a fair amount of slough and remains quite tender. 03/10/2022: He has been taking the prescribed levofloxacin and has about 3 days left of this. The erythema and induration on his right leg has improved. He continues to experience further breakdown of the right medial calf wound. There is extensive slough and debris accumulation there. There is heavy slough on his left dorsal foot wound, but no odor or purulent drainage. The right medial foot wound appears to be closed. The right dorsal lateral foot wound also has a fair amount of slough but it is less tender than at our last visit. He still does not have an appointment with dermatology. 03/22/2022: The right anterior tibial wound has closed. The right lateral  foot wound is nearly closed with just a little bit of slough. The left dorsal foot wound is smaller and continues to have some slough buildup but less so than at prior visits. There is good granulation tissue underlying the slough. Unfortunately the right medial calf wound continues to deteriorate. It has a foul odor today and a lot of necrotic tissue, the surrounding skin is also erythematous and moist. 03/30/2022: The right lateral foot wound continues to contract and just has a little bit of slough and eschar present. The left dorsal foot wound is also smaller with slough overlying good granulation tissue. The right medial calf wound actually looks quite a bit better today; there is no odor and there is much less necrotic tissue although some remains present. We have been using topical gentamicin and he has been taking oral Augmentin. 04/07/2022: The patient saw dermatology at [facility] last week. Unfortunately, it appears that they  did not read any of the documentation that was provided. 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 Overall, it was a highly unsatisfactory consultation. In the meantime, however, we were able to identify compounding pharmacy who could create a topical sodium thiosulfate ointment. The patient has that with him today. Both of the right foot wounds are now closed. The left dorsal foot wound has contracted but still has a layer of slough on the surface. The medial calf wounds on the right all look a little bit better. They still have heavy slough along with the chunks of calcium. Everything is stained purple. 04/14/2022: The wound on his dorsal left foot is a little bit smaller again today. There is still  slough accumulation on the surface. The medial calf wounds have quite a bit less slough accumulation today. His right leg, however, is warm and erythematous, concerning for cellulitis. 04/14/2022: He completed his course of Augmentin yesterday. The medial calf wound sites are much cleaner today and shallower. There is less fragmented calcium in the wounds. He has a lot of lymphedema fluid-like drainage from these wounds, but no purulent discharge or malodor. The wound on his dorsal left foot has also contracted and is shallower. There is a layer of slough over healthy granulation tissue. 05/05/2022: The left dorsal foot wound is smaller today with less slough and more granulation tissue. The right medial calf wounds are shallower, but have extensive slough and some fat necrosis. The drainage has diminished considerably, however. 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 05/12/2022: The left dorsal foot wound continues to contract and fill with granulation tissue. There is slough on the wound surface. The right medial calf wounds also continue to fill with healthy tissue, but there is remaining slough and fat necrosis present. He had a significant increase in his drainage this week and it was a bright yellow-green color. He also had a new wound open over his right anterior tibial surface associated with the spike of cutaneous calcium. The leg is more red, as well, concerning for infection. 05/19/2022: His culture returned positive for Pseudomonas. He is currently taking a course of oral levofloxacin. We have also been using topical gentamicin mixed in with his sodium thiosulfate. All of the wounds look better this week. 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 There is slough and nonviable subcutaneous tissue still present. The new anterior tibial wound  just has a light layer of slough. The dorsal foot wound also has some slough present and appears a little dry today. 05/26/2022: The right medial leg wound continues to improve. It is feeling with granulation tissue, but still accumulates a fairly thick layer of slough on the surface. The more recent anterior tibial wound has remained quite small, but also has some slough on the surface. The left dorsal foot wound has contracted somewhat and has perimeter epithelialization. He completed his oral levofloxacin. 06/02/2022: The left dorsal foot wound continues to improve significantly. There is just a little slough on the wound surface. The right medial leg wound had black drainage, but it looks like silver alginate was applied last week and I theorized that silver  sulfide was created with a combination of the silver alginate and the sodium thiosulfate. It did, however, have a bit of an odor to it and extensive slough accumulation. The small anterior tibial wound also had a bit of slough and nonviable subcutaneous tissue present. The skin of his leg was rather macerated, as well. 06/09/2022: The left dorsal foot wound is smaller again this week with just a light layer of slough on the surface. The right medial leg wound looks substantially better. The leg and periwound skin are no longer red and macerated. There is good granulation tissue forming with less slough and nonviable tissue. The anterior tibial wound just has a small amount of slough accumulation. CANDON, CARAS ([id number]) [id number] Page 5 of 15 06/16/2022: The left dorsal foot wound continues to contract. His wrap slipped a little bit, however, and his foot is more swollen. The right medial leg wound continues to improve. The underlying tissue is more vital-appearing and there is less slough. He does have substantial drainage. The small anterior tibial wound has a bit of slough accumulation. 06/23/2022: The left  dorsal foot wound is about half the size as it was last week. There is just some light slough on the surface and some eschar buildup around the edges. The right anterior leg wound is small with a little slough on the surface. Unfortunately, the right medial leg wound deteriorated fairly substantially. It is malodorous with gray/green/black discoloration. 06/30/2022: The left dorsal foot wound continues to contract. It is nearly closed with just a light layer of slough present. The right anterior leg wound is about the same size and has a small amount of slough on the surface. The right medial leg wound looks a lot better than it did last week. The odor has abated and the tissue is less pulpy and necrotic-looking. There is still a fairly thick layer of slough present. 12/13; the left dorsal foot wound looks very healthy. There was no need to change the dressing here and no debridement. He has a small area on the right anterior lower leg apparently had not changed much in size. The area on the medial leg is the most problematic area this is large with some depth and a completely nonviable surface today. 07/14/2022: The left dorsal foot wound is smaller today with just a little slough and eschar present. The right anterior lower leg wound is about the same without any significant change. The medial leg wound has a black and green surface and is malodorous. No purulent drainage and the periwound skin is intact. Edema control is suboptimal. 07/21/2022: The dorsal foot wound continues to contract and is nearly closed with just a little bit of slough and eschar on the surface. The right anterior lower leg wound is shallower today but otherwise the dimensions are unchanged. The medial leg wound looks significantly better although it still has a nonviable surface; the odor and black/green surface has resolved. His culture came back with multiple species sensitive to Augmentin, which was prescribed and a  very low level of Pseudomonas, which should be handled by the topical gentamicin we have been using. 07/28/2022: The dorsal foot wound is down to just a very tiny opening with a little eschar on the surface. The right anterior tibial wound is about the same with some slough buildup. The right medial leg wound looks quite a bit better this week. There is thick rubbery slough on the surface, but the odor and drainage has improved significantly. Electronic Signature(s) Signed: 07/28/2022  1:25:54 PM By: Fredirick Maudlin MD FACS Entered By: Fredirick Maudlin on 07/28/2022 13:25:53 -------------------------------------------------------------------------------- Physical Exam Details Patient Name: Date of Service: [name], [name] 07/28/2022 12:30 PM Medical Record Number: [id number] Patient Account Number: [id number] Date of Birth/Sex: Treating RN: October 07, 1950 (72 y.o. M) Primary Care Provider: PA [name], NO Other Clinician: Referring Provider: Treating Provider/Extender: [name] in Treatment: 24 Constitutional He is hypertensive, but asymptomatic.. . . . No acute distress. Respiratory Normal work of breathing on room air. Notes 07/28/2022: The dorsal foot wound is down to just a very tiny opening with a little eschar on the surface. The right anterior tibial wound is about the same with some slough buildup. The right medial leg wound looks quite a bit better this week. There is thick rubbery slough on the surface, but the odor and drainage has improved significantly. Electronic Signature(s) Signed: 07/28/2022 1:26:22 PM By: Fredirick Maudlin MD FACS Entered By: Fredirick Maudlin on 07/28/2022 13:26:22 -------------------------------------------------------------------------------- Physician Orders Details Patient Name: Date of Service: [name], [name] 07/28/2022 12:30 PM Medical Record Number: [id number] Patient Account Number: [id number] Date of Birth/Sex: Treating RN: 03-06-1951  (72 y.o. [name] Primary Care Provider: PA Darnelle Spangle Other Clinician: BRACY, PEPPER ([id number]) [id number] Page 6 of 15 Referring Provider: Treating Provider/Extender: [name] in Treatment: 24 Verbal / Phone Orders: No Diagnosis Coding ICD-10 Coding Code Description [phone] 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d L97.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) I10 Essential (primary) hypertension I25.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 - Dr. [name] Rm 1 Return A ppointment in 2 weeks. - Dr. [name] Room 1 Return A Anesthetic Wound #1 Left,Dorsal Foot (In clinic) Topical Lidocaine 4% applied to wound bed Wound #3 Right,Medial Lower Leg (In clinic) Topical Lidocaine 4% applied to wound bed Wound #6 Right,Anterior Lower Leg (In clinic) Topical Lidocaine 4% applied to wound bed Edema Control - Lymphedema / SCD / Other Lymphedema Pumps. Use Lymphedema pumps on leg(s) 2-3 times a day for 45-60 minutes. If wearing any wraps or hose, do not remove them. Continue exercising as instructed. - use 1-2 times per day over wraps Exercise regularly Wound Treatment Wound #1 - Foot Wound Laterality: Dorsal, Left Peri-Wound Care: Sween Lotion (Moisturizing lotion) 1 x Per Week/30 Days Discharge Instructions: Apply moisturizing lotion as directed Prim Dressing: Promogran Prisma Matrix, 4.34 (sq in) (silver collagen) 1 x Per Week/30 Days ary Discharge Instructions: Moisten collagen with saline or hydrogel Secondary Dressing: Woven Gauze Sponge, Non-Sterile 4x4 in 1 x Per Week/30  Days Discharge Instructions: Apply over primary dressing as directed. Compression Wrap: ThreePress (3 layer compression wrap) 1 x Per Week/30 Days Discharge Instructions: Apply three layer compression as directed. Wound #3 - Lower Leg Wound Laterality: Right, Medial Peri-Wound Care: Sween Lotion (Moisturizing lotion) 1 x Per Week/30 Days Discharge Instructions: Apply moisturizing lotion as directed Topical: Gentamicin 1 x Per Week/30 Days Discharge Instructions: As directed by physician Prim Dressing: Santyl Ointment 1 x Per Week/30 Days ary Discharge Instructions: Apply nickel thick amount to wound bed as instructed Secondary Dressing: ABD Pad, 8x10 1 x Per Week/30 Days Discharge Instructions: Apply over primary dressing as directed. Secondary Dressing: Woven Gauze Sponge, Non-Sterile 4x4 in 1  x Per Week/30 Days Discharge Instructions: Apply over primary dressing as directed. Secondary Dressing: Zetuvit Plus 4x8 in 1 x Per Week/30 Days Discharge Instructions: Apply over primary dressing as directed. Compression Wrap: ThreePress (3 layer compression wrap) 1 x Per Week/30 Days Discharge Instructions: Apply three layer compression as directed. Wound #6 - Lower Leg Wound Laterality: Right, Anterior Peri-Wound Care: Sween Lotion (Moisturizing lotion) 1 x Per Week/30 Days JOMAR, DENZ ([id number]) [id number] Page 7 of 15 Discharge Instructions: Apply moisturizing lotion as directed Prim Dressing: Promogran Prisma Matrix, 4.34 (sq in) (silver collagen) 1 x Per Week/30 Days ary Discharge Instructions: Moisten collagen with saline or hydrogel Secondary Dressing: ABD Pad, 8x10 1 x Per Week/30 Days Discharge Instructions: Apply over primary dressing as directed. Secondary Dressing: Woven Gauze Sponge, Non-Sterile 4x4 in 1 x Per Week/30 Days Discharge Instructions: Apply over primary dressing as directed. Secondary Dressing: Zetuvit Plus 4x8 in 1 x Per Week/30  Days Discharge Instructions: Apply over primary dressing as directed. Compression Wrap: ThreePress (3 layer compression wrap) 1 x Per Week/30 Days Discharge Instructions: Apply three layer compression as directed. Electronic Signature(s) Signed: 07/28/2022 3:50:54 PM By: Fredirick Maudlin MD FACS Entered By: Fredirick Maudlin on 07/28/2022 13:26:52 -------------------------------------------------------------------------------- Problem List Details Patient Name: Date of Service: [name], [name] 07/28/2022 12:30 PM Medical Record Number: [id number] Patient Account Number: [id number] Date of Birth/Sex: Treating RN: 1950-10-14 (72 y.o. 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 No Yes 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) 02/09/2022 No Yes I10 Essential (primary) hypertension 02/09/2022 No Yes I25.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 02/09/2022 No Yes I89.0 Lymphedema, not elsewhere classified 02/09/2022 No Yes Z86.718 Personal history of other venous thrombosis and embolism 02/09/2022 No Yes TYSHAN, ENDERLE ([id number]) [id number] Page 8 of 15 Inactive Problems ICD-10 Code Description Active Date Inactive Date L97.512 Non-pressure chronic ulcer of other part of right foot with fat layer exposed 02/17/2022 02/17/2022 Resolved Problems Electronic Signature(s) Signed: 07/28/2022 1:24:50 PM By: Fredirick Maudlin MD FACS Entered By: Fredirick Maudlin on 07/28/2022  13:24:50 -------------------------------------------------------------------------------- Progress Note Details Patient Name: Date of Service: [name] 07/28/2022 12:30 PM Medical Record Number: [id number] Patient Account Number: [id number] Date of Birth/Sex: Treating RN: 28-Jun-1951 (72 y.o. M) Primary Care Provider: PA [name], NO Other Clinician: Referring Provider: Treating Provider/Extender: [name] in Treatment: 24 Subjective Chief Complaint Information obtained from Patient Patient seen for complaints of Non-Healing Wounds. History of Present Illness (HPI) ADMISSION 02/09/2022 This is a 72 year old man who recently moved to [place] from [place]. He has a history of of coronary artery disease and prior left popliteal vein DVT . He is not diabetic and does not smoke. 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 He has had multiple spinal surgeries and has limited mobility. He primarily uses a motorized wheelchair. He has had lymphedema for many years and does have lymphedema pumps although he says he does not use them frequently. He has wounds on his bilateral lower extremities. He was receiving care in [place] for these prior to his move. They were apparently packing his wounds with Betadine soaked  gauze. He has worn compression wraps in the past but not recently. He did say that they helped tremendously. In clinic today, his right ABI is noncompressible but has good signals; the left ABI was 1.17. No formal vascular studies have been performed. On his left dorsal foot, there is a circular lesion that exposes the fat layer. There is fairly heavy slough accumulation within the wound, but no significant odor. On his right medial calf, he has multiple pinholes that have slough buildup in them and probe for about 2 to 4 mm in depth. They are draining clear fluid. On his right lateral midfoot, he has ulceration consistent with venous stasis. It  is geographic and has slough accumulation. 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 02/17/2022: All of the wounds are little bit larger today and have more slough accumulation. Today, the medial calf openings are larger and probe to something that feels calcified. There is odor coming from his wounds. His vascular studies are scheduled for August 9 and 10. 02-24-2022 upon evaluation today patient presents for follow-up concerning his ongoing issues here with his lower extremities. With that being said he does have significant problems with what appears to be cellulitis unfortunately the PCR culture that was sent last week got crushed by [employer] in transit. With that being said we are going to reobtain a culture today although I am actually to do it on the right medial leg as this seems to be the most inflamed area today where there is some questionable drainage as well. I am going to remove some of the calcium deposits which are loosening obtain a deeper culture from this area. Fortunately I do not see any evidence of active infection systemically which is good news but locally I think we are having a bigger issue here. He does have his appointment next Thursday with vascular. Patient has also been referred to [facility] for further evaluation and treatment of the calcinosis for possible sulfate injections. 03/04/2022: The culture that was performed last week grew out multiple species including Klebsiella, Morganella, and Pseudomonas aeruginosa. He had been prescribed Bactrim but this was not adequate to cover all of the species and levofloxacin was recommended. He completed the Bactrim but did not pick up the levofloxacin and begin taking it until yesterday. 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 He had arterial studies performed today. He does have evidence of peripheral arterial disease. Results copied here: +-------+-----------+-----------+------------+------------+ ABI/TBIT oday's ABIT oday's TBIPrevious ABIPrevious TBI +-------+-----------+-----------+------------+------------+ Right [place] 0.75    +-------+-----------+-----------+------------+------------+ Left [place] 0.57    +-------+-----------+-----------+------------+------------+ [name] ([id number]) [id number] Page 9 of 15 Arterial wall calcification precludes accurate ankle pressures and ABIs. Summary: Right: Resting right ankle-brachial index indicates noncompressible right lower extremity arteries. The right toe-brachial index is normal. Left: Resting left ankle-brachial index indicates noncompressible left lower extremity arteries. The left toe-brachial index is abnormal. He continues to have further tissue breakdown at the right medial calf and there is ample slough accumulation along with necrotic subcutaneous tissue. The left dorsal foot wound has built up slough, as well. The right medial foot wound is nearly closed with just a light layer of eschar over the surface. The right dorsal lateral foot wound has also accumulated a fair amount of slough and remains quite tender.  03/10/2022: He has been taking the prescribed levofloxacin and has about 3 days left of this. The erythema and induration on his right leg has improved. He continues to experience further breakdown of the right medial calf wound. There is extensive slough and debris accumulation there. There is heavy slough on his left dorsal foot wound, but no odor or purulent drainage. The right medial foot wound appears to be closed. The right dorsal lateral foot wound also has a fair amount of slough but it is less tender than at our last visit. He still does not  have an appointment with dermatology. 03/22/2022: The right anterior tibial wound has closed. The right lateral foot wound is nearly closed with just a little bit of slough. The left dorsal foot wound is smaller and continues to have some slough buildup but less so than at prior visits. There is good granulation tissue underlying the slough. Unfortunately the right medial calf wound continues to deteriorate. It has a foul odor today and a lot of necrotic tissue, the surrounding skin is also erythematous and moist. 03/30/2022: The right lateral foot wound continues to contract and just has a little bit of slough and eschar present. The left dorsal foot wound is also smaller with slough overlying good granulation tissue. The right medial calf wound actually looks quite a bit better today; there is no odor and there is much less necrotic tissue although some remains present. We have been using topical gentamicin and he has been taking oral Augmentin. 04/07/2022: The patient saw dermatology at [facility] last week. Unfortunately, it appears that they did not read any of the documentation that was provided. 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 Overall, it was a highly unsatisfactory consultation. In the meantime, however, we were able to identify compounding pharmacy who could create a topical sodium thiosulfate ointment. The patient has that with him today. Both of the right foot wounds are now closed. The left dorsal foot wound has contracted but still has a layer of slough on the surface. The medial calf wounds on the right all look a little bit better. They still have heavy slough along with the chunks of calcium. Everything is stained  purple. 04/14/2022: The wound on his dorsal left foot is a little bit smaller again today. There is still slough accumulation on the surface. The medial calf wounds have quite a bit less slough accumulation today. His right leg, however, is warm and erythematous, concerning for cellulitis. 04/14/2022: He completed his course of Augmentin yesterday. The medial calf wound sites are much cleaner today and shallower. There is less fragmented calcium in the wounds. He has a lot of lymphedema fluid-like drainage from these wounds, but no purulent discharge or malodor. The wound on his dorsal left foot has also contracted and is shallower. There is a layer of slough over healthy granulation tissue. 05/05/2022: The left dorsal foot wound is smaller today with less slough and more granulation tissue. The right medial calf wounds are shallower, but have extensive slough and some fat necrosis. The drainage has diminished considerably, however. 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 05/12/2022: The  left dorsal foot wound continues to contract and fill with granulation tissue. There is slough on the wound surface. The right medial calf wounds also continue to fill with healthy tissue, but there is remaining slough and fat necrosis present. He had a significant increase in his drainage this week and it was a bright yellow-green color. He also had a new wound open over his right anterior tibial surface associated with the spike of cutaneous calcium. The leg is more red, as well, concerning for infection. 05/19/2022: His culture returned positive for Pseudomonas. He is currently taking a course of oral levofloxacin. We have also been using topical gentamicin mixed in with his sodium thiosulfate. All of the wounds look better this week. The ones associated with his calcinosis cutis have filled in  substantially. There is slough and nonviable subcutaneous tissue still present. The new anterior tibial wound just has a light layer of slough. The dorsal foot wound also has some slough present and appears a little dry today. 05/26/2022: The right medial leg wound continues to improve. It is feeling with granulation tissue, but still accumulates a fairly thick layer of slough on the surface. The more recent anterior tibial wound has remained quite small, but also has some slough on the surface. The left dorsal foot wound has contracted somewhat and has perimeter epithelialization. He completed his oral levofloxacin. 06/02/2022: The left dorsal foot wound continues to improve significantly. There is just a little slough on the wound surface. The right medial leg wound had black drainage, but it looks like silver alginate was applied last week and I theorized that silver sulfide was created with a combination of the silver alginate and the sodium thiosulfate. It did, however, have a bit of an odor to it and extensive slough accumulation. The small anterior tibial wound also had a bit of slough and nonviable subcutaneous tissue present. The skin of his leg was rather macerated, as well. 06/09/2022: The left dorsal foot wound is smaller again this week with just a light layer of slough on the surface. The right medial leg wound looks substantially better. The leg and periwound skin are no longer red and macerated. There is good granulation tissue forming with less slough and nonviable tissue. The anterior tibial wound just has a small amount of slough accumulation. 06/16/2022: The left dorsal foot wound continues to contract. His wrap slipped a little bit, however, and his foot is more swollen. The right medial leg wound continues to improve. The underlying tissue is more vital-appearing and there is less slough. He does have substantial drainage. The small anterior tibial wound has a bit of slough  accumulation. 06/23/2022: The left dorsal foot wound is about half the size as it was last week. There is just some light slough on the surface and some eschar buildup around the edges. The right anterior leg wound is small with a little slough on the surface. Unfortunately, the right medial leg wound deteriorated fairly substantially. It is malodorous with gray/green/black discoloration. 06/30/2022: The left dorsal foot wound continues to contract. It is nearly closed with just a light layer of slough present. The right anterior leg wound is about the same size and has a small amount of slough on the surface. The right medial leg wound looks a lot better than it did last week. The odor has abated and the tissue is less pulpy and necrotic-looking. There is still a fairly thick layer of slough present. 12/13; the left dorsal foot wound looks very  healthy. There was no need to change the dressing here and no debridement. He has a small area on the right anterior lower leg apparently had not changed much in size. The area on the medial leg is the most problematic area this is large with some depth and a completely nonviable surface today. 07/14/2022: The left dorsal foot wound is smaller today with just a little slough and eschar present. The right anterior lower leg wound is about the same without any significant change. The medial leg wound has a black and green surface and is malodorous. No purulent drainage and the periwound skin is intact. Edema control is suboptimal. 07/21/2022: The dorsal foot wound continues to contract and is nearly closed with just a little bit of slough and eschar on the surface. The right anterior lower [name], [name] ([id number]) [id number] Page 10 of 15 leg wound is shallower today but otherwise the dimensions are unchanged. The medial leg wound looks significantly better although it still has a nonviable surface; the odor and black/green surface  has resolved. His culture came back with multiple species sensitive to Augmentin, which was prescribed and a very low level of Pseudomonas, which should be handled by the topical gentamicin we have been using. 07/28/2022: The dorsal foot wound is down to just a very tiny opening with a little eschar on the surface. The right anterior tibial wound is about the same with some slough buildup. The right medial leg wound looks quite a bit better this week. There is thick rubbery slough on the surface, but the odor and drainage has improved significantly. Patient History Information obtained from Patient, Chart. Family History Cancer - Mother, Heart Disease - Father,Mother, Hypertension - Father,Siblings, Lung Disease - Siblings, No family history of Diabetes, Hereditary Spherocytosis, Kidney Disease, Seizures, Stroke, Thyroid Problems, Tuberculosis. Social History Former smoker - quit 1991, Marital Status - Married, Alcohol Use - Moderate, Drug Use - No History, Caffeine Use - Daily - coffee. Medical History Eyes Patient has history of Cataracts - right eye removed Denies history of Glaucoma, Optic Neuritis Ear/Nose/Mouth/Throat Denies history of Chronic sinus problems/congestion, Middle ear problems Hematologic/Lymphatic Patient has history of Anemia - macrocytic, Lymphedema Cardiovascular Patient has history of Congestive Heart Failure, Deep Vein Thrombosis - left leg, Hypertension, Peripheral Venous Disease Endocrine Denies history of Type I Diabetes, Type II Diabetes Genitourinary Denies history of End Stage Renal Disease Integumentary (Skin) Denies history of History of Burn Oncologic Denies history of Received Chemotherapy, Received Radiation Psychiatric Denies history of Anorexia/bulimia, Confinement Anxiety Hospitalization/Surgery History - cervical fusion. - lumbar surgery. - coronary stent placement. - lasik eye surgery. - hydrocele excision. Medical A Surgical History  Notes nd Constitutional Symptoms (General Health) obesity Ear/Nose/Mouth/Throat hard of hearing Respiratory frozen diaphragm right Cardiovascular hyperlipidemia Musculoskeletal myositis, lumbar DDD, spinal stenosis, cervical facet joint syndrome Objective Constitutional He is hypertensive, but asymptomatic.Marland Kitchen No acute distress. Vitals Time Taken: 12:27 PM, Weight: 267 lbs, Temperature: 98.5 F, Pulse: 65 bpm, Respiratory Rate: 18 breaths/min, Blood Pressure: 169/72 mmHg. Respiratory Normal work of breathing on room air. General Notes: 07/28/2022: The dorsal foot wound is down to just a very tiny opening with a little eschar on the surface. The right anterior tibial wound is about the same with some slough buildup. The right medial leg wound looks quite a bit better this week. There is thick rubbery slough on the surface, but the odor and drainage has improved significantly. Integumentary ([name], Skin) Wound #1 status is Open. Original cause of wound was Gradually  Appeared. The date acquired was: 12/07/2021. The wound has been in treatment 24 weeks. The wound is located on the Left,Dorsal Foot. The wound measures 0.6cm length x 0.3cm width x 0.1cm depth; 0.141cm^2 area and 0.014cm^3 volume. There is Fat Layer (Subcutaneous Tissue) exposed. There is no tunneling or undermining noted. There is a medium amount of serosanguineous drainage noted. There is large (67-100%) pink granulation within the wound bed. There is a small (1-33%) amount of necrotic tissue within the wound bed including Adherent Slough. The periwound skin appearance had no abnormalities noted for texture. The periwound skin appearance exhibited: Dry/Scaly, Rubor. Wound #3 status is Open. Original cause of wound was Gradually Appeared. The date acquired was: 12/07/2021. The wound has been in treatment 24 weeks. The wound is located on the Right,Medial Lower Leg. The wound measures 3cm length x 4.5cm width x 0.4cm depth; 10.603cm^2  area and 4.241cm^3 volume. There is Fat Layer (Subcutaneous Tissue) exposed. There is no tunneling or undermining noted. There is a large amount of serosanguineous drainage noted. Foul odor after cleansing was noted. The wound margin is distinct with the outline attached to the wound base. There is medium (34-66%) pink, friable, hyper [name], [name] ([id number]) [id number] Page 11 of 15 granulation within the wound bed. There is a medium (34-66%) amount of necrotic tissue within the wound bed including Eschar and Adherent Slough. The periwound skin appearance had no abnormalities noted for texture. The periwound skin appearance exhibited: Maceration, Hemosiderin Staining. The periwound skin appearance did not exhibit: Dry/Scaly, Erythema. Periwound temperature was noted as No Abnormality. The periwound has tenderness on palpation. Wound #6 status is Open. Original cause of wound was Gradually Appeared. The date acquired was: 05/12/2022. The wound has been in treatment 11 weeks. The wound is located on the Right,Anterior Lower Leg. The wound measures 0.3cm length x 0.2cm width x 0.4cm depth; 0.047cm^2 area and 0.019cm^3 volume. There is Fat Layer (Subcutaneous Tissue) exposed. There is no tunneling or undermining noted. There is a medium amount of serosanguineous drainage noted. The wound margin is epibole. There is large (67-100%) pink granulation within the wound bed. There is a small (1-33%) amount of necrotic tissue within the wound bed including Adherent Slough. The periwound skin appearance had no abnormalities noted for texture. The periwound skin appearance had no abnormalities noted for moisture. The periwound skin appearance exhibited: Hemosiderin Staining. The periwound skin appearance did not exhibit: Atrophie Blanche, Cyanosis, Ecchymosis, Mottled, Pallor, Rubor, Erythema. Periwound temperature was noted as No Abnormality. The periwound has tenderness  on palpation. 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 There was a Selective/Open Wound Non-Viable Tissue Debridement with a total area of 0.18 sq cm performed by Fredirick Maudlin, MD. With the following instrument(s): Curette to remove Non-Viable tissue/material. Material removed includes Eschar and Slough and after achieving pain control using Lidocaine 5% topical ointment. No specimens were taken. A time out was conducted at 12:56, prior to the start of the procedure. A Minimum amount of bleeding was controlled with Pressure. The procedure was tolerated well. Post Debridement Measurements: 0.6cm length x 0.3cm  width x 0.1cm depth; 0.014cm^3 volume. Character of Wound/Ulcer Post Debridement is improved. Post procedure Diagnosis Wound #1: Same as Pre-Procedure General Notes: Scribed for Dr. [name] by Blanche East, RN. Pre-procedure diagnosis of Wound #1 is a Lymphedema located on the Left,Dorsal Foot . There was a Three Layer Compression Therapy Procedure by Blanche East, RN. Post procedure Diagnosis Wound #1: Same as Pre-Procedure Wound #3 Pre-procedure diagnosis of Wound #3 is a Lymphedema located on the Right,Medial Lower Leg . There was a Excisional Skin/Subcutaneous Tissue Debridement with a total area of 13.5 sq cm performed by Fredirick Maudlin, MD. With the following instrument(s): Curette to remove Viable and Non-Viable tissue/material. Material removed  includes Subcutaneous Tissue and Slough and after achieving pain control using Lidocaine 5% topical ointment. No specimens were taken. A time out was conducted at 12:56, prior to the start of the procedure. A Minimum amount of bleeding was controlled with Pressure. The procedure was tolerated well. Post Debridement Measurements: 3.1cm length x 4.5cm width x 0.4cm depth; 4.383cm^3 volume. Character of Wound/Ulcer Post Debridement is improved. Post procedure Diagnosis Wound #3: Same as Pre-Procedure General Notes: Scribed for Dr. [name] by Blanche East, RN. Pre-procedure diagnosis of Wound #3 is a Lymphedema located on the Right,Medial Lower Leg . There was a Three Layer Compression Therapy Procedure by Blanche East, RN. 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 There was a Selective/Open Wound Non-Viable Tissue Debridement with a total area of 0.06 sq cm performed by Fredirick Maudlin, MD. With the following instrument(s): Curette to remove Non-Viable tissue/material. Material removed includes [facility] after achieving pain control using Lidocaine 5% topical ointment. No specimens were taken. A time out was conducted at 12:56, prior to the start of the procedure. A Minimum amount of bleeding was controlled with Pressure. The procedure was tolerated well. Post Debridement Measurements: 0.3cm length x 0.2cm width x 0.4cm depth; 0.019cm^3 volume. Character of Wound/Ulcer Post Debridement requires further debridement. Post procedure Diagnosis Wound #6: Same as Pre-Procedure General Notes: Scribed for Dr. [name] by Blanche East, RN. 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 There was a Three Layer Compression Therapy Procedure by Blanche East, RN. Post procedure Diagnosis Wound #6: Same as Pre-Procedure Plan IHAN, [name] ([id number])  [phone].pdf Page 12 of 15 Follow-up Appointments: Return Appointment in 1 week. - Dr. [name] Rm 1 Return Appointment in 2 weeks. - Dr. [name] Room 1 Anesthetic: Wound #1 Left,Dorsal Foot: (In clinic) Topical Lidocaine 4% applied to wound bed Wound #3 Right,Medial Lower Leg: (In clinic) Topical Lidocaine 4% applied to wound bed Wound #6 Right,Anterior Lower Leg: (In clinic) Topical Lidocaine 4% applied to wound bed Edema Control - Lymphedema / SCD / Other: Lymphedema Pumps. Use Lymphedema pumps on leg(s) 2-3 times a day for 45-60 minutes. If wearing any wraps or hose, do not remove them. Continue exercising as instructed. - use 1-2 times per day over wraps Exercise regularly WOUND #1: - Foot Wound Laterality: Dorsal, Left Peri-Wound Care: Sween Lotion (Moisturizing lotion) 1 x Per Week/30 Days Discharge Instructions: Apply moisturizing lotion as directed Prim Dressing: Promogran Prisma Matrix, 4.34 (sq in) (silver collagen) 1 x Per Week/30 Days ary Discharge Instructions: Moisten collagen with saline or hydrogel Secondary Dressing: Woven Gauze Sponge, Non-Sterile 4x4 in 1 x Per Week/30 Days Discharge Instructions: Apply over primary dressing as directed. Com pression Wrap: ThreePress (3 layer compression  wrap) 1 x Per Week/30 Days Discharge Instructions: Apply three layer compression as directed. WOUND #3: - Lower Leg Wound Laterality: Right, Medial Peri-Wound Care: Sween Lotion (Moisturizing lotion) 1 x Per Week/30 Days Discharge Instructions: Apply moisturizing lotion as directed Topical: Gentamicin 1 x Per Week/30 Days Discharge Instructions: As directed by physician Prim Dressing: Santyl Ointment 1 x Per Week/30 Days ary Discharge Instructions: Apply nickel thick amount to wound bed as instructed Secondary Dressing: ABD Pad, 8x10 1 x Per Week/30 Days Discharge Instructions: Apply over primary dressing as directed. Secondary Dressing: Woven Gauze  Sponge, Non-Sterile 4x4 in 1 x Per Week/30 Days Discharge Instructions: Apply over primary dressing as directed. Secondary Dressing: Zetuvit Plus 4x8 in 1 x Per Week/30 Days Discharge Instructions: Apply over primary dressing as directed. Com pression Wrap: ThreePress (3 layer compression wrap) 1 x Per Week/30 Days Discharge Instructions: Apply three layer compression as directed. WOUND #6: - Lower Leg Wound Laterality: Right, Anterior Peri-Wound Care: Sween Lotion (Moisturizing lotion) 1 x Per Week/30 Days Discharge Instructions: Apply moisturizing lotion as directed Prim Dressing: Promogran Prisma Matrix, 4.34 (sq in) (silver collagen) 1 x Per Week/30 Days ary Discharge Instructions: Moisten collagen with saline or hydrogel Secondary Dressing: ABD Pad, 8x10 1 x Per Week/30 Days Discharge Instructions: Apply over primary dressing as directed. Secondary Dressing: Woven Gauze Sponge, Non-Sterile 4x4 in 1 x Per Week/30 Days Discharge Instructions: Apply over primary dressing as directed. Secondary Dressing: Zetuvit Plus 4x8 in 1 x Per Week/30 Days Discharge Instructions: Apply over primary dressing as directed. Com pression Wrap: ThreePress (3 layer compression wrap) 1 x Per Week/30 Days Discharge Instructions: Apply three layer compression as directed. 07/28/2022: The dorsal foot wound is down to just a very tiny opening with a little eschar on the surface. The right anterior tibial wound is about the same with some slough buildup. The right medial leg wound looks quite a bit better this week. There is thick rubbery slough on the surface, but the odor and drainage has improved significantly. I used a curette to debride slough from the anterior tibial wound and eschar from the left dorsal foot wound. I debrided slough and nonviable subcutaneous tissue, along with a few crystals of calcium from the right medial leg wound. We will continue Santyl and topical gentamicin to this location. He will  complete his course of oral Augmentin tomorrow. Continue Prisma silver collagen to the remaining wounds. Bilateral 3 layer compression. Follow-up in 1 week. Electronic Signature(s) Signed: 07/28/2022 1:28:45 PM By: Fredirick Maudlin MD FACS Entered By: Fredirick Maudlin on 07/28/2022 13:28:45 -------------------------------------------------------------------------------- HxROS Details Patient Name: Date of Service: CYRIL, WOODMANSEE [name] 07/28/2022 12:30 PM Medical Record Number: [id number] Patient Account Number: [id number] Date of Birth/Sex: Treating RN: 10/04/1950 (72 y.o. M) Primary Care Provider: PA Darnelle Spangle Other Clinician: Referring Provider: Treating Provider/Extender: [name] in Treatment: [address], [place] ([id number]) [id number] Page 13 of 15 Information Obtained From Patient Chart Constitutional Symptoms (General Health) Medical History: Past Medical History Notes: obesity Eyes Medical History: Positive for: Cataracts - right eye removed Negative for: Glaucoma; Optic Neuritis Ear/Nose/Mouth/Throat Medical History: Negative for: Chronic sinus problems/congestion; Middle ear problems Past Medical History Notes: hard of hearing Hematologic/Lymphatic Medical History: Positive for: Anemia - macrocytic; Lymphedema Respiratory Medical History: Past Medical History Notes: frozen diaphragm right Cardiovascular Medical History: Positive for: Congestive Heart Failure; Deep Vein Thrombosis - left leg; Hypertension; Peripheral Venous Disease Past Medical History Notes: hyperlipidemia Endocrine Medical History: Negative for: Type I Diabetes; Type II  Diabetes Genitourinary Medical History: Negative for: End Stage Renal Disease Integumentary (Skin) Medical History: Negative for: History of Burn Musculoskeletal Medical History: Past Medical History Notes: myositis, lumbar DDD, spinal stenosis, cervical facet joint  syndrome Oncologic Medical History: Negative for: Received Chemotherapy; Received Radiation Psychiatric Medical History: Negative for: Anorexia/bulimia; Confinement Anxiety HBO Extended History Items Eyes: Cataracts Immunizations Pneumococcal Vaccine: Received Pneumococcal VaccinationLINH, JOHANNES ([id number]) [id number] Page 14 of 15 Received Pneumococcal Vaccination On or After 60th Birthday: Yes Implantable Devices No devices added Hospitalization / Surgery History Type of Hospitalization/Surgery cervical fusion lumbar surgery coronary stent placement lasik eye surgery hydrocele excision Family and Social History Cancer: Yes - Mother; Diabetes: No; Heart Disease: Yes - Father,Mother; Hereditary Spherocytosis: No; Hypertension: Yes - Father,Siblings; Kidney Disease: No; Lung Disease: Yes - Siblings; Seizures: No; Stroke: No; Thyroid Problems: No; Tuberculosis: No; Former smoker - quit 1991; Marital Status - Married; Alcohol Use: Moderate; Drug Use: No History; Caffeine Use: Daily - coffee; Financial Concerns: No; Food, Clothing or Shelter Needs: No; Support System Lacking: No; Transportation Concerns: No Electronic Signature(s) Signed: 07/28/2022 3:50:54 PM By: Fredirick Maudlin MD FACS Entered By: Fredirick Maudlin on 07/28/2022 13:25:59 -------------------------------------------------------------------------------- SuperBill Details Patient Name: Date of Service: LENZY, KERSCHNER 07/28/2022 Medical Record Number: [id number] Patient Account Number: [id number] Date of Birth/Sex: Treating RN: 1950/11/28 (72 y.o. 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) I10 Essential (primary) hypertension I25.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 I89.0 Lymphedema, not elsewhere classified Z86.718 Personal history of other venous thrombosis and embolism Facility Procedures : CPT4 Code: [id number] Description: 11042 - DEB SUBQ TISSUE 20 SQ CM/< ICD-10 Diagnosis Description 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 Modifier: ed Quantity: 1 : CPT4 Code: [id number] Description: 17915 - DEBRIDE WOUND 1ST 20 SQ CM OR < ICD-10 Diagnosis Description 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 L97.522 Non-pressure chronic ulcer of other part of left foot with fat layer  exposed Modifier: ed Quantity: 1 Physician Procedures : CPT4 Code Description Modifier [id number] - WC PHYS LEVEL 4 - EST PT 25 ICD-10 Diagnosis Description OCEAN, SCHILDT ([id number]) [id number] L97.812 Non-pressure chronic ulcer of other part of right lower leg with fat layer  exposed L97.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) Quantity: 1 7.pdf Page 15 of 15 : [id number] 11042 - WC PHYS SUBQ TISS 20 SQ CM 1 ICD-10 Diagnosis Description L54.492 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

## 2022-07-29 NOTE — Progress Notes (Signed)
[name] ([id number]) [id number] Page 1 of 12 Visit Report for 07/28/2022 Arrival Information Details Patient Name: Date of Service: [name], [name] 07/28/2022 12:30 PM Medical Record Number: [id number] Patient Account Number: [id number] Date of Birth/Sex: Treating RN: 06-19-1951 (72 y.o. [name] Primary Care [name]: PA [name], [place] Other Clinician: Referring [name]: Treating [name]/Extender: [name] in Treatment: 24 Visit Information History Since Last Visit Added or deleted any medications: No Patient Arrived: Wheel Chair Any new allergies or adverse reactions: No Arrival Time: 12:33 Had a fall or experienced change in No Accompanied By: self activities of daily living that may affect Transfer Assistance: None risk of falls: Patient Requires Transmission-Based Precautions: No Signs or symptoms of abuse/neglect since last visito No Patient Has Alerts: Yes Hospitalized since last visit: No Patient Alerts: R ABI= N/C, TBI= .75 Implantable device outside of the clinic excluding No L ABI =N/C, TBI = .57 cellular tissue based products placed in the center since last visit: Has Compression in Place as Prescribed: Yes Pain Present Now: No Electronic Signature(s) Signed: 07/29/2022 4:07:58 PM By: [name] East RN Entered By: [name] on 07/28/2022 12:33:41 -------------------------------------------------------------------------------- Compression Therapy Details Patient Name: Date of Service: [name], [name] 07/28/2022 12:30 PM Medical Record Number: [id number] Patient Account Number: [id number] Date of Birth/Sex: Treating RN: 1950/10/10 (72 y.o. [name] Primary Care [name]: PA [name], [place] Other Clinician: Referring [name]: Treating [name]/Extender: [name] in Treatment: 24 Compression Therapy Performed for Wound Assessment: Wound #3 Right,Medial Lower Leg Performed By:  Clinician [name] East, RN Compression Type: Three Layer Post Procedure Diagnosis Same as Pre-procedure Electronic Signature(s) Signed: 07/29/2022 4:07:58 PM By: [name] East RN Entered By: [name] on 07/28/2022 13:03:06 [name] ([id number]) [id number] Page 2 of 12 -------------------------------------------------------------------------------- Compression Therapy Details Patient Name: Date of Service: [name], [name] 07/28/2022 12:30 PM Medical Record Number: [id number] Patient Account Number: [id number] Date of Birth/Sex: Treating RN: 1951/05/27 (72 y.o. [name] Primary Care Maddison Kilner: PA [name], [place] Other Clinician: Referring Rukaya Kleinschmidt: Treating [name]/Extender: [name] in Treatment: 24 Compression Therapy Performed for Wound Assessment: Wound #6 Right,Anterior Lower Leg Performed By: Clinician [name] East, RN Compression Type: Three Layer Post Procedure Diagnosis Same as Pre-procedure Electronic Signature(s) Signed: 07/29/2022 4:07:58 PM By: [name] East RN Entered By: [name] on 07/28/2022 13:03:06 -------------------------------------------------------------------------------- Compression Therapy Details Patient Name: Date of Service: [name], [name] 07/28/2022 12:30 PM Medical Record Number: [id number] Patient Account Number: [id number] Date of Birth/Sex: Treating RN: 05-Feb-1951 (72 y.o. [name] Primary Care [name]: PA [name], [place] Other Clinician: Referring Tashae [name]: Treating [name]/Extender: [name] in Treatment: 24 Compression Therapy Performed for Wound Assessment: Wound #1 Left,Dorsal Foot Performed By: Clinician [name] East, RN Compression Type: Three Layer Post Procedure Diagnosis Same as Pre-procedure Electronic Signature(s) Signed: 07/29/2022 4:07:58 PM By: [name] East RN Entered By: [name] on 07/28/2022  13:03:06 -------------------------------------------------------------------------------- Encounter Discharge Information Details Patient Name: Date of Service: [name], [name] 07/28/2022 12:30 PM Medical Record Number: [id number] Patient Account Number: [id number] Date of Birth/Sex: Treating RN: 1951-01-12 (72 y.o. [name] Primary Care Yarixa Lightcap: PA [name], [place] Other Clinician: Referring Avier Jech: Treating [name]/Extender: [name] in Treatment: 24 Encounter Discharge Information Items Post Procedure Vitals Discharge Condition: Stable Temperature (F): 98.5 Ambulatory Status: Wheelchair Pulse (bpm): 65 Discharge Destination: Home Respiratory Rate (breaths/min): 18 Transportation: Private Auto Blood Pressure (mmHg): 169/72 Accompanied By: self Schedule Follow-up Appointment: Yes Clinical Summary of Care:  [name] ([id number]) [id number] Page 3 of 12 Electronic Signature(s) Signed: 07/29/2022 4:07:58 PM By: [name] East RN Entered By: [name] on 07/28/2022 13:32:48 -------------------------------------------------------------------------------- Lower Extremity Assessment Details Patient Name: Date of Service: [name], [name] 07/28/2022 12:30 PM Medical Record Number: [id number] Patient Account Number: [id number] Date of Birth/Sex: Treating RN: [birth year] (72 y.o. [name] Primary Care Amire Leazer: PA [name], [place] Other Clinician: Referring [name]: Treating [name]/Extender: [name] in Treatment: 24 Edema Assessment Assessed: [Left: No] [Right: No] Edema: [Left: Yes] [Right: Yes] Calf Left: Right: Point of Measurement: From Medial Instep 41 cm 44.4 cm Ankle Left: Right: Point of Measurement: From Medial Instep 24.5 cm 28 cm Vascular Assessment Pulses: Dorsalis Pedis Palpable: [Left:Yes] [Right:Yes] Electronic Signature(s) Signed: 07/29/2022 4:07:58 PM By: [name] East  RN Entered By: [name] on 07/28/2022 12:44:16 -------------------------------------------------------------------------------- Multi Wound Chart Details Patient Name: Date of Service: [name] 07/28/2022 12:30 PM Medical Record Number: [id number] Patient Account Number: [id number] Date of Birth/Sex: Treating RN: [birth year] (72 y.o. M) Primary Care Gorge Almanza: PA [name], [place] Other Clinician: Referring Avalynne Diver: Treating Viha Kriegel/Extender: [name] in Treatment: 24 Vital Signs Height(in): Pulse(bpm): 65 Weight(lbs): 267 Blood Pressure(mmHg): 169/72 Body Mass Index(BMI): Temperature(F): 98.5 Respiratory Rate(breaths/min): 18 [1:Photos:] [id number] Page 4 of 12] Left, Dorsal Foot Right, Medial Lower Leg Right, Anterior Lower Leg Wound Location: Gradually Appeared Gradually Appeared Gradually Appeared Wounding Event: Lymphedema Lymphedema Calcinosis Primary Etiology: Cataracts, Anemia, Lymphedema, Cataracts, Anemia, Lymphedema, Cataracts, Anemia, Lymphedema, Comorbid History: Congestive Heart Failure, Deep Vein Congestive Heart Failure, Deep Vein Congestive Heart Failure, Deep Vein Thrombosis, Hypertension, Peripheral Thrombosis, Hypertension, Peripheral Thrombosis, Hypertension, Peripheral Venous Disease Venous Disease Venous Disease 12/07/2021 12/07/2021 05/12/2022 Date Acquired: '24 24 11 '$ [name] of Treatment: Open Open Open Wound Status: No No No Wound Recurrence: 0.6x0.3x0.1 3x4.5x0.4 0.3x0.2x0.4 Measurements L x W x D (cm) 0.141 10.603 0.047 A (cm) : rea 0.014 4.241 0.019 Volume (cm) : 96.30% -98.50% 80.10% % Reduction in Area: 99.10% -98.50% 20.80% % Reduction in Volume: Full Thickness Without Exposed Full Thickness With Exposed Support Full Thickness Without Exposed Classification: Support Structures Structures Support Structures Medium Large Medium Exudate A mount: Serosanguineous Serosanguineous  Serosanguineous Exudate Type: red, brown red, brown red, brown Exudate Color: N/A Yes No Foul Odor A Cleansing: fter N/A No N/A Odor Anticipated Due to Product Use: N/A Distinct, outline attached Epibole Wound Margin: Large (67-100%) Medium (34-66%) Large (67-100%) Granulation A mount: Pink Pink, Hyper-granulation, Friable Pink Granulation Quality: Small (1-33%) Medium (34-66%) Small (1-33%) Necrotic Amount: Adherent Slough Eschar, Adherent Slough Adherent Slough Necrotic Tissue: Fat Layer (Subcutaneous Tissue): Yes Fat Layer (Subcutaneous Tissue): Yes Fat Layer (Subcutaneous Tissue): Yes Exposed Structures: Fascia: No Fascia: No Fascia: No Tendon: No Tendon: No Tendon: No Muscle: No Muscle: No Muscle: No Joint: No Joint: No Joint: No Bone: No Bone: No Bone: No Small (1-33%) Small (1-33%) None Epithelialization: Debridement - Selective/Open Wound Debridement - Excisional Debridement - Selective/Open Wound Debridement: Pre-procedure Verification/Time Out 12:56 12:56 12:56 Taken: Lidocaine 5% topical ointment Lidocaine 5% topical ointment Lidocaine 5% topical ointment Pain Control: Necrotic/Eschar, Slough Subcutaneous, [employer] Tissue Debrided: Non-Viable Tissue Skin/Subcutaneous Tissue Non-Viable Tissue Level: 0.18 13.5 0.06 Debridement A (sq cm): rea Curette Curette Curette Instrument: Minimum Minimum Minimum Bleeding: Pressure Pressure Pressure Hemostasis A chieved: Procedure was tolerated well Procedure was tolerated well Procedure was tolerated well Debridement Treatment Response: 0.6x0.3x0.1 3.1x4.5x0.4 0.3x0.2x0.4 Post Debridement Measurements L x W x D (cm) 0.014 4.383 0.019 Post Debridement Volume: (cm) No Abnormalities  Noted Excoriation: Yes Excoriation: No Periwound Skin Texture: Induration: No Callus: No Crepitus: No Rash: No Scarring: No Dry/Scaly: Yes Maceration: Yes Maceration: No Periwound Skin Moisture: Dry/Scaly:  No Dry/Scaly: No Rubor: Yes Hemosiderin Staining: Yes Hemosiderin Staining: Yes Periwound Skin Color: Erythema: No Atrophie [name]: No Cyanosis: No Ecchymosis: No Erythema: No Mottled: No Pallor: No Rubor: No N/A No Abnormality No Abnormality Temperature: N/A Yes Yes Tenderness on Palpation: Compression Therapy Compression Therapy Compression Therapy Procedures Performed: Debridement Debridement Debridement Treatment Notes Electronic Signature(s) Signed: 07/28/2022 1:24:56 PM By: Fredirick Maudlin MD FACS Entered By: Fredirick Maudlin on 07/28/2022 13:24:55 [name] ([id number]) [id number] Page 5 of 12 -------------------------------------------------------------------------------- Multi-Disciplinary Care Plan Details Patient Name: Date of Service: [name], [name] 07/28/2022 12:30 PM Medical Record Number: [id number] Patient Account Number: [id number] Date of Birth/Sex: Treating RN: 1951-01-19 (72 y.o. [name] Primary Care Taneka Espiritu: PA [name], [place] Other Clinician: Referring Kharter Brew: Treating Syriah Delisi/Extender: [name] in Treatment: 24 Multidisciplinary Care Plan reviewed with physician Active Inactive Venous Leg Ulcer Nursing Diagnoses: Knowledge deficit related to disease process and management Potential for venous Insuffiency (use before diagnosis confirmed) Goals: Patient will maintain optimal edema control Date Initiated: 02/17/2022 Target Resolution Date: 08/04/2022 Goal Status: Active Interventions: Assess peripheral edema status every visit. Compression as ordered Provide education on venous insufficiency Treatment Activities: Non-invasive vascular studies : 02/17/2022 Therapeutic compression applied : 02/17/2022 Notes: Wound/Skin Impairment Nursing Diagnoses: Impaired tissue integrity Knowledge deficit related to ulceration/compromised skin integrity Goals: Patient/caregiver will verbalize  understanding of skin care regimen Date Initiated: 02/17/2022 Target Resolution Date: 08/04/2022 Goal Status: Active Ulcer/skin breakdown will have a volume reduction of 30% by week 4 Date Initiated: 02/17/2022 Date Inactivated: 03/10/2022 Target Resolution Date: 03/10/2022 Unmet Reason: infection being treated, Goal Status: Unmet waiting on derm appte Ulcer/skin breakdown will have a volume reduction of 50% by week 8 Date Initiated: 03/10/2022 Date Inactivated: 04/14/2022 Target Resolution Date: 04/07/2022 Goal Status: Unmet Unmet Reason: calcinosis Ulcer/skin breakdown will have a volume reduction of 80% by week 12 Date Initiated: 04/14/2022 Date Inactivated: 05/05/2022 Target Resolution Date: 05/05/2022 Unmet Reason: infection, chronic Goal Status: Unmet condition Interventions: Assess patient/caregiver ability to obtain necessary supplies Assess patient/caregiver ability to perform ulcer/skin care regimen upon admission and as needed Assess ulceration(s) every visit Provide education on ulcer and skin care Treatment Activities: Skin care regimen initiated : 02/17/2022 Topical wound management initiated : 02/17/2022 RANDELL, TEARE ([id number]) [phone].pdf Page 6 of 12 Notes: Electronic Signature(s) Signed: 07/29/2022 4:07:58 PM By: [name] East RN Entered By: [name] on 07/28/2022 13:26:33 -------------------------------------------------------------------------------- Pain Assessment Details Patient Name: Date of Service: [name], [name] 07/28/2022 12:30 PM Medical Record Number: [id number] Patient Account Number: [id number] Date of Birth/Sex: Treating RN: 01-Nov-1950 (72 y.o. [name] Primary Care Jaycelynn Knickerbocker: PA [name], [place] Other Clinician: Referring Frimy Uffelman: Treating Givanni Staron/Extender: [name] in Treatment: 24 Active Problems Location of Pain Severity and Description of Pain Patient Has Paino No Site Locations Rate  the pain. Current Pain Level: 0 Pain Management and Medication Current Pain Management: Electronic Signature(s) Signed: 07/29/2022 4:07:58 PM By: [name] East RN Entered By: [name] on 07/28/2022 12:43:24 -------------------------------------------------------------------------------- Patient/Caregiver Education Details Patient Name: Date of Service: [name], [name] 1/3/2024andnbsp12:30 PM Medical Record Number: [id number] Patient Account Number: [id number] Date of Birth/Gender: Treating RN: May 23, 1951 (72 y.o. [name] Primary Care Physician: PA [name], [place] Other Clinician: Referring Physician: Treating Physician/Extender: [name] in Treatment: [address] [place], [name] ([id number]) [id number] Page  7 of 12 Education Provided To: Patient Education Topics Provided Wound/Skin Impairment: Methods: Explain/Verbal Responses: Reinforcements needed, State content correctly Electronic Signature(s) Signed: 07/29/2022 4:07:58 PM By: [name] East RN Entered By: [name] on 07/28/2022 13:26:45 -------------------------------------------------------------------------------- Wound Assessment Details Patient Name: Date of Service: [name], [name] 07/28/2022 12:30 PM Medical Record Number: [id number] Patient Account Number: [id number] Date of Birth/Sex: Treating RN: [birth year] (72 y.o. [name] Primary Care Bobbye Petti: PA [name], [place] Other Clinician: Referring Elynn Patteson: Treating Jamare Vanatta/Extender: [name] in Treatment: 24 Wound Status Wound Number: 1 Primary Lymphedema Etiology: Wound Location: Left, Dorsal Foot Wound Open Wounding Event: Gradually Appeared Status: Date Acquired: 12/07/2021 Comorbid Cataracts, Anemia, Lymphedema, Congestive Heart Failure, Deep [name] Of Treatment: 24 History: Vein Thrombosis, Hypertension, Peripheral Venous Disease Clustered Wound:  No Photos Wound Measurements Length: (cm) 0.6 Width: (cm) 0.3 Depth: (cm) 0.1 Area: (cm) 0.141 Volume: (cm) 0.014 % Reduction in Area: 96.3% % Reduction in Volume: 99.1% Epithelialization: Small (1-33%) Tunneling: No Undermining: No Wound Description Classification: Full Thickness Without Exposed Su Exudate Amount: Medium Exudate Type: Serosanguineous Exudate Color: red, brown pport Structures Wound Bed Granulation Amount: Large (67-100%) Exposed Structure Granulation Quality: Pink Fascia Exposed: No Necrotic Amount: Small (1-33%) Fat Layer (Subcutaneous Tissue) Exposed: Yes Necrotic Quality: Adherent Slough Tendon Exposed: No Muscle Exposed: No Joint Exposed: No Bone Exposed: No Longest, [name] ([id number]) [id number] Page 8 of 12 Periwound Skin Texture Texture Color No Abnormalities Noted: Yes No Abnormalities Noted: No Rubor: Yes Moisture No Abnormalities Noted: No Dry / Scaly: Yes Treatment Notes Wound #1 (Foot) Wound Laterality: Dorsal, Left Cleanser Peri-Wound Care Sween Lotion (Moisturizing lotion) Discharge Instruction: Apply moisturizing lotion as directed Topical Primary Dressing Promogran Prisma Matrix, 4.34 (sq in) (silver collagen) Discharge Instruction: Moisten collagen with saline or hydrogel Secondary Dressing Woven Gauze Sponge, Non-Sterile 4x4 in Discharge Instruction: Apply over primary dressing as directed. Secured With Compression Wrap ThreePress (3 layer compression wrap) Discharge Instruction: Apply three layer compression as directed. Compression Stockings Add-Ons Electronic Signature(s) Signed: 07/29/2022 4:07:58 PM By: [name] East RN Entered By: [name] on 07/28/2022 12:49:51 -------------------------------------------------------------------------------- Wound Assessment Details Patient Name: Date of Service: [name], TAVANO 07/28/2022 12:30 PM Medical Record Number: [id number] Patient Account  Number: [id number] Date of Birth/Sex: Treating RN: [birth year] (72 y.o. [name] Primary Care Nikeya Maxim: PA [name], [place] Other Clinician: Referring Lawerence Dery: Treating Ayub Kirsh/Extender: [name] in Treatment: 24 Wound Status Wound Number: 3 Primary Lymphedema Etiology: Wound Location: Right, Medial Lower Leg Wound Open Wounding Event: Gradually Appeared Status: Date Acquired: 12/07/2021 Comorbid Cataracts, Anemia, Lymphedema, Congestive Heart Failure, Deep [name] Of Treatment: 24 History: Vein Thrombosis, Hypertension, Peripheral Venous Disease Clustered Wound: No Photos MILTON, SAGONA ([id number]) [id number] Page 9 of 12 Wound Measurements Length: (cm) 3 Width: (cm) 4.5 Depth: (cm) 0.4 Area: (cm) 10.603 Volume: (cm) 4.241 % Reduction in Area: -98.5% % Reduction in Volume: -98.5% Epithelialization: Small (1-33%) Tunneling: No Undermining: No Wound Description Classification: Full Thickness With Exposed Suppo Wound Margin: Distinct, outline attached Exudate Amount: Large Exudate Type: Serosanguineous Exudate Color: red, brown rt Structures Foul Odor After Cleansing: Yes Due to Product Use: No Slough/Fibrino Yes Wound Bed Granulation Amount: Medium (34-66%) Exposed Structure Granulation Quality: Pink, Hyper-granulation, Friable Fascia Exposed: No Necrotic Amount: Medium (34-66%) Fat Layer (Subcutaneous Tissue) Exposed: Yes Necrotic Quality: Eschar, Adherent Slough Tendon Exposed: No Muscle Exposed: No Joint Exposed: No Bone Exposed: No Periwound Skin Texture Texture Color No Abnormalities Noted: Yes No Abnormalities Noted: No Erythema: No Moisture Hemosiderin  Staining: Yes No Abnormalities Noted: No Dry / Scaly: No Temperature / Pain Maceration: Yes Temperature: No Abnormality Tenderness on Palpation: Yes Treatment Notes Wound #3 (Lower Leg) Wound Laterality: Right, Medial Cleanser Peri-Wound  Care Sween Lotion (Moisturizing lotion) Discharge Instruction: Apply moisturizing lotion as directed Topical Gentamicin Discharge Instruction: As directed by physician Primary Dressing Santyl Ointment Discharge Instruction: Apply nickel thick amount to wound bed as instructed Secondary Dressing ABD Pad, 8x10 Discharge Instruction: Apply over primary dressing as directed. Woven Gauze Sponge, Non-Sterile 4x4 in Discharge Instruction: Apply over primary dressing as directed. Zetuvit Plus 4x8 in Discharge Instruction: Apply over primary dressing as directed. Secured With [name] ([id number]) [id number] Page 10 of 12 Compression Wrap ThreePress (3 layer compression wrap) Discharge Instruction: Apply three layer compression as directed. Compression Stockings Add-Ons Electronic Signature(s) Signed: 07/29/2022 4:07:58 PM By: [name] East RN Entered By: [name] on 07/28/2022 12:52:02 -------------------------------------------------------------------------------- Wound Assessment Details Patient Name: Date of Service: PINK, MAYE 07/28/2022 12:30 PM Medical Record Number: [id number] Patient Account Number: [id number] Date of Birth/Sex: Treating RN: 01/24/1951 (72 y.o. [name] Primary Care Traeson Dusza: PA [name], [place] Other Clinician: Referring Samyukta Cura: Treating Vartan Kerins/Extender: [name] in Treatment: 24 Wound Status Wound Number: 6 Primary Calcinosis Etiology: Wound Location: Right, Anterior Lower Leg Wound Open Wounding Event: Gradually Appeared Status: Date Acquired: 05/12/2022 Comorbid Cataracts, Anemia, Lymphedema, Congestive Heart Failure, Deep [name] Of Treatment: 11 History: Vein Thrombosis, Hypertension, Peripheral Venous Disease Clustered Wound: No Photos Wound Measurements Length: (cm) 0.3 Width: (cm) 0.2 Depth: (cm) 0.4 Area: (cm) 0.047 Volume: (cm) 0.019 % Reduction in Area: 80.1% %  Reduction in Volume: 20.8% Epithelialization: None Tunneling: No Undermining: No Wound Description Classification: Full Thickness Without Exposed Suppor Wound Margin: Epibole Exudate Amount: Medium Exudate Type: Serosanguineous Exudate Color: red, brown t Structures Foul Odor After Cleansing: No Slough/Fibrino Yes Wound Bed Granulation Amount: Large (67-100%) Exposed Structure Granulation Quality: Pink Fascia Exposed: No Necrotic Amount: Small (1-33%) Fat Layer (Subcutaneous Tissue) Exposed: Yes Necrotic Quality: Adherent Slough Tendon Exposed: No Muscle Exposed: No Joint Exposed: No Bone Exposed: No Norby, [name] ([id number]) [id number] Page 11 of 12 Periwound Skin Texture Texture Color No Abnormalities Noted: Yes No Abnormalities Noted: No Atrophie [name]: No Moisture Cyanosis: No No Abnormalities Noted: Yes Ecchymosis: No Erythema: No Hemosiderin Staining: Yes Mottled: No Pallor: No Rubor: No Temperature / Pain Temperature: No Abnormality Tenderness on Palpation: Yes Treatment Notes Wound #6 (Lower Leg) Wound Laterality: Right, Anterior Cleanser Peri-Wound Care Sween Lotion (Moisturizing lotion) Discharge Instruction: Apply moisturizing lotion as directed Topical Primary Dressing Promogran Prisma Matrix, 4.34 (sq in) (silver collagen) Discharge Instruction: Moisten collagen with saline or hydrogel Secondary Dressing ABD Pad, 8x10 Discharge Instruction: Apply over primary dressing as directed. Woven Gauze Sponge, Non-Sterile 4x4 in Discharge Instruction: Apply over primary dressing as directed. Zetuvit Plus 4x8 in Discharge Instruction: Apply over primary dressing as directed. Secured With Compression Wrap ThreePress (3 layer compression wrap) Discharge Instruction: Apply three layer compression as directed. Compression Stockings Add-Ons Electronic Signature(s) Signed: 07/29/2022 4:07:58 PM By: [name] East RN Entered By:  [name] on 07/28/2022 12:50:51 -------------------------------------------------------------------------------- Vitals Details Patient Name: Date of Service: [name] 07/28/2022 12:30 PM Medical Record Number: [id number] Patient Account Number: [id number] Date of Birth/Sex: Treating RN: Apr 18, 1951 (72 y.o. [name] Primary Care Ladarius Seubert: PA [name], [place] Other Clinician: Referring Nary Sneed: Treating Laval Cafaro/Extender: [name] in Treatment: 24 Vital Signs Time Taken: 12:27 Temperature (F): 98.5 Weight (lbs): 267 Pulse (bpm): 65  Respiratory Rate (breaths/min): 18 Hurtubise, Varian ([id number]) [id number] Page 12 of 12 Blood Pressure (mmHg): 169/72 Reference Range: 80 - 120 mg / dl Electronic Signature(s) Signed: 07/29/2022 4:07:58 PM By: [name] East RN Entered By: [name] on 07/28/2022 12:34:03

## 2022-08-02 ENCOUNTER — Ambulatory Visit (INDEPENDENT_AMBULATORY_CARE_PROVIDER_SITE_OTHER): Payer: Medicare Other

## 2022-08-02 ENCOUNTER — Ambulatory Visit (INDEPENDENT_AMBULATORY_CARE_PROVIDER_SITE_OTHER): Payer: Medicare Other | Admitting: Orthopaedic Surgery

## 2022-08-02 DIAGNOSIS — M25551 Pain in right hip: Secondary | ICD-10-CM

## 2022-08-02 DIAGNOSIS — Z96641 Presence of right artificial hip joint: Secondary | ICD-10-CM | POA: Diagnosis not present

## 2022-08-02 NOTE — Progress Notes (Signed)
The patient is a very pleasant 72 year old gentleman who comes in for evaluation treatment of right hip pain.  He says he has been having pain for about a month now but he points to his low back on the right side in the back of his thigh and sciatic region as a source of his pain.  It does radiate to his thigh.  He has a history of a right total hip arthroplasty performed in [place] 2 years ago.  He has not had no issues with it up until recently.  However he does deny any groin pain.  He is in a power mobility device due to chronic degenerative muscular disease.  He also has chronic radicular lumbar pain.  He is on Eliquis.  He wants to make sure that his hip replacement is fine.  He says he can deal with the chronic lumbar spine issues.  On exam his right operative hip moves smoothly and fluidly with no blocks or rotation and no pain in the groin.  He does not have a positive straight leg raise.  Most of his pain seems to be of the right side lower lumbar spine and the sciatic region.  An AP pelvis and lateral of his right hip shows a well-seated total hip arthroplasty with no complicating features.  His offset appears normal and the hip replacements bone ingrown with no complicating features.  On the standing AP view we can see the lower aspect of his lumbar spine with significant degenerative disc disease.  He said this is chronic and I shared the x-rays with him and he was given reassurance that his hip replacement looks fine.  Follow-up with hip can be as needed.  If he does have a other back issues he knows he can always let us know but he said he does not need any other treatment right now.

## 2022-08-04 ENCOUNTER — Encounter (HOSPITAL_BASED_OUTPATIENT_CLINIC_OR_DEPARTMENT_OTHER): Payer: Medicare Other | Admitting: General Surgery

## 2022-08-04 DIAGNOSIS — L97522 Non-pressure chronic ulcer of other part of left foot with fat layer exposed: Secondary | ICD-10-CM | POA: Diagnosis not present

## 2022-08-04 DIAGNOSIS — L942 Calcinosis cutis: Secondary | ICD-10-CM | POA: Diagnosis not present

## 2022-08-04 DIAGNOSIS — I890 Lymphedema, not elsewhere classified: Secondary | ICD-10-CM | POA: Diagnosis not present

## 2022-08-04 DIAGNOSIS — I251 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: Secondary | ICD-10-CM | POA: Diagnosis not present

## 2022-08-04 DIAGNOSIS — L97812 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d: Secondary | ICD-10-CM | POA: Diagnosis not present

## 2022-08-04 DIAGNOSIS — I872 Venous insufficiency (chronic) (peripheral): Secondary | ICD-10-CM | POA: Diagnosis not present

## 2022-08-04 NOTE — Progress Notes (Addendum)
[name], [name] ([id number]) [id number] Page 1 of 13 Visit Report for 08/04/2022 Chief Complaint Document Details Patient Name: Date of Service: [name], [name] 08/04/2022 12:30 PM Medical Record Number: [id number] Patient Account Number: [id number] Date of Birth/Sex: Treating RN: 19-Mar-1951 (72 y.o. M) Primary Care Provider: PA [name], NO Other Clinician: Referring Provider: Treating Provider/Extender: [name] in Treatment: 25 Information Obtained from: Patient Chief Complaint Patient seen for complaints of Non-Healing Wounds. Electronic Signature(s) Signed: 08/04/2022 1:23:46 PM By: Fredirick Maudlin MD FACS Entered By: Fredirick Maudlin on 08/04/2022 13:23:46 -------------------------------------------------------------------------------- Debridement Details Patient Name: Date of Service: [name], [name] 08/04/2022 12:30 PM Medical Record Number: [id number] Patient Account Number: [id number] Date of Birth/Sex: Treating RN: July 21, 1951 (72 y.o. M) Primary Care Provider: PA [name], NO Other Clinician: Referring Provider: Treating Provider/Extender: [name] in Treatment: 25 Debridement Performed for Assessment: Wound #3 Right,Medial Lower Leg Performed By: Physician Fredirick Maudlin, MD Debridement Type: Debridement Level of Consciousness (Pre-procedure): Awake and Alert Pre-procedure Verification/Time Out Yes - 13:03 Taken: Start Time: 13:04 T Area Debrided (L x W): otal 2.9 (cm) x 4.5 (cm) = 13.05 (cm) Tissue and other material debrided: Viable, Non-Viable, Slough, Subcutaneous, Slough Level: Skin/Subcutaneous Tissue Debridement Description: Excisional Instrument: Curette Bleeding: Minimum Hemostasis Achieved: Pressure Response to Treatment: Procedure was tolerated well Level of Consciousness (Post- Awake and Alert procedure): Post Debridement Measurements of Total Wound Length: (cm) 2.9 Width: (cm)  4.5 Depth: (cm) 0.4 Volume: (cm) 4.1 Character of Wound/Ulcer Post Debridement: Requires Further Debridement Post Procedure Diagnosis Same as Pre-procedure Notes [name] ([id number]) [id number] Page 2 of 13 Scribed for Dr. [name] by Blanche East, RN Electronic Signature(s) Signed: 08/04/2022 1:23:39 PM By: Fredirick Maudlin MD FACS Entered By: Fredirick Maudlin on 08/04/2022 13:23:39 -------------------------------------------------------------------------------- HPI Details Patient Name: Date of Service: [name], [name] 08/04/2022 12:30 PM Medical Record Number: [id number] Patient Account Number: [id number] Date of Birth/Sex: Treating RN: 1950/10/09 (72 y.o. M) Primary Care Provider: PA [name], NO Other Clinician: Referring Provider: Treating Provider/Extender: [name] in Treatment: 25 History of Present Illness HPI Description: ADMISSION 02/09/2022 This is a 72 year old man who recently moved to [place] from [place]. He has a history of of coronary artery disease and prior left popliteal vein DVT . He is not diabetic and does not smoke. 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 He has had multiple spinal surgeries and has limited mobility. He primarily uses a motorized wheelchair. He has had lymphedema for many years and does have lymphedema pumps although he says he does not use them frequently. He has wounds on his bilateral lower extremities. He was receiving care in [place] for these prior to his move. They were apparently packing his wounds with Betadine soaked gauze. He has worn compression wraps in the past but not recently. He did say that they helped tremendously. In clinic today, his right ABI is noncompressible but has good signals; the left ABI was 1.17. No formal vascular studies have been performed. On his left dorsal foot, there is a circular lesion that exposes the fat layer. There is fairly  heavy slough accumulation within the wound, but no significant odor. On his right medial calf, he has multiple pinholes that have slough buildup in them and probe for about 2 to 4 mm in depth. They are draining clear fluid. On his right lateral midfoot, he has ulceration consistent with venous stasis. It is geographic and has slough accumulation. He has changes in  his lower extremities consistent with stasis dermatitis, but no hemosiderin deposition or thickening of the skin. 02/17/2022: All of the wounds are little bit larger today and have more slough accumulation. Today, the medial calf openings are larger and probe to something that feels calcified. There is odor coming from his wounds. His vascular studies are scheduled for August 9 and 10. 02-24-2022 upon evaluation today patient presents for follow-up concerning his ongoing issues here with his lower extremities. With that being said he does have significant problems with what appears to be cellulitis unfortunately the PCR culture that was sent last week got crushed by [employer] in transit. With that being said we are going to reobtain a culture today although I am actually to do it on the right medial leg as this seems to be the most inflamed area today where there is some questionable drainage as well. I am going to remove some of the calcium deposits which are loosening obtain a deeper culture from this area. Fortunately I do not see any evidence of active infection systemically which is good news but locally I think we are having a bigger issue here. He does have his appointment next Thursday with vascular. Patient has also been referred to [facility] for further evaluation and treatment of the calcinosis for possible sulfate injections. 03/04/2022: The culture that was performed last week grew out multiple species including Klebsiella, Morganella, and Pseudomonas aeruginosa. He had been prescribed Bactrim but this was not adequate to cover all of  the species and levofloxacin was recommended. He completed the Bactrim but did not pick up the levofloxacin and begin taking it until yesterday. 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 He had arterial studies performed today. He does have evidence of peripheral arterial disease. Results copied here: +-------+-----------+-----------+------------+------------+ ABI/TBIT oday's ABIT oday's TBIPrevious ABIPrevious TBI +-------+-----------+-----------+------------+------------+ Right [place] 0.75    +-------+-----------+-----------+------------+------------+ Left [place] 0.57    +-------+-----------+-----------+------------+------------+ Arterial wall calcification precludes accurate ankle pressures and ABIs. Summary: Right: Resting right ankle-brachial index indicates noncompressible right lower extremity arteries. The right toe-brachial index is normal. Left: Resting left ankle-brachial index indicates noncompressible left lower extremity arteries. The left toe-brachial index is abnormal. He continues to have further tissue breakdown at the right medial calf and there is ample slough accumulation along with necrotic subcutaneous tissue. The left dorsal foot wound has built up slough, as well. The right medial foot wound is nearly closed with just a light layer of eschar over the surface. The right dorsal lateral foot wound has also accumulated a fair amount of slough and remains quite tender. 03/10/2022: He has been taking the prescribed levofloxacin and has about 3 days left of this. The erythema and induration on his right leg has improved. He continues to experience further breakdown of the right medial calf wound. There is extensive slough and debris accumulation there. There is heavy slough on his left dorsal foot  wound, but no odor or purulent drainage. The right medial foot wound appears to be closed. The right dorsal lateral foot wound also has a fair amount of slough but it is less tender than at our last visit. He still does not have an appointment with dermatology. [name], [name] ([id number]) [id number] Page 3 of 13 03/22/2022: The right anterior tibial wound has closed. The right lateral foot wound is nearly closed with just a little bit  of slough. The left dorsal foot wound is smaller and continues to have some slough buildup but less so than at prior visits. There is good granulation tissue underlying the slough. Unfortunately the right medial calf wound continues to deteriorate. It has a foul odor today and a lot of necrotic tissue, the surrounding skin is also erythematous and moist. 03/30/2022: The right lateral foot wound continues to contract and just has a little bit of slough and eschar present. The left dorsal foot wound is also smaller with slough overlying good granulation tissue. The right medial calf wound actually looks quite a bit better today; there is no odor and there is much less necrotic tissue although some remains present. We have been using topical gentamicin and he has been taking oral Augmentin. 04/07/2022: The patient saw dermatology at [facility] last week. Unfortunately, it appears that they did not read any of the documentation that was provided. 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 Overall, it was a highly unsatisfactory consultation. In the meantime, however, we were able to identify compounding pharmacy who could create a topical sodium thiosulfate ointment. The patient has that  with him today. Both of the right foot wounds are now closed. The left dorsal foot wound has contracted but still has a layer of slough on the surface. The medial calf wounds on the right all look a little bit better. They still have heavy slough along with the chunks of calcium. Everything is stained purple. 04/14/2022: The wound on his dorsal left foot is a little bit smaller again today. There is still slough accumulation on the surface. The medial calf wounds have quite a bit less slough accumulation today. His right leg, however, is warm and erythematous, concerning for cellulitis. 04/14/2022: He completed his course of Augmentin yesterday. The medial calf wound sites are much cleaner today and shallower. There is less fragmented calcium in the wounds. He has a lot of lymphedema fluid-like drainage from these wounds, but no purulent discharge or malodor. The wound on his dorsal left foot has also contracted and is shallower. There is a layer of slough over healthy granulation tissue. 05/05/2022: The left dorsal foot wound is smaller today with less slough and more granulation tissue. The right medial calf wounds are shallower, but have extensive slough and some fat necrosis. The drainage has diminished considerably, however. 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 05/12/2022: The left dorsal foot wound continues to contract and fill with granulation tissue. There is slough on the wound surface. The right medial calf wounds also continue to fill with healthy tissue, but there is remaining slough and fat necrosis present. He had a significant increase in his drainage this week and it was a bright yellow-green color. He also had a new wound open over his right anterior tibial surface associated with the spike of cutaneous calcium. The leg is more red, as well, concerning for  infection. 05/19/2022: His culture returned positive for Pseudomonas. He is currently taking a course of oral levofloxacin. We have also been using topical gentamicin mixed in with his sodium thiosulfate. All of the wounds look better this week.  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 There is slough and nonviable subcutaneous tissue still present. The new anterior tibial wound just has a light layer of slough. The dorsal foot wound also has some slough present and appears a little dry today. 05/26/2022: The right medial leg wound continues to improve. It is feeling with granulation tissue, but still accumulates a fairly thick layer of slough on the surface. The more recent anterior tibial wound has remained quite small, but also has some slough on the surface. The left dorsal foot wound has contracted somewhat and has perimeter epithelialization. He completed his oral levofloxacin. 06/02/2022: The left dorsal foot wound continues to improve significantly. There is just a little slough on the wound surface. The right medial leg wound had black drainage, but it looks like silver alginate was applied last week and I theorized that silver sulfide was created with a combination of the silver alginate and the sodium thiosulfate. It did, however, have a bit of an odor to it and extensive slough accumulation. The small anterior tibial wound also had a bit of slough and nonviable subcutaneous tissue present. The skin of his leg was rather macerated, as well. 06/09/2022: The left dorsal foot wound is smaller again this week with just a light layer of slough on the surface. The right medial leg wound looks substantially better. The leg and periwound skin are no longer red and macerated. There is good granulation tissue forming with less slough and nonviable tissue. The anterior tibial wound just has a small amount of slough accumulation. 06/16/2022: The left dorsal foot wound  continues to contract. His wrap slipped a little bit, however, and his foot is more swollen. The right medial leg wound continues to improve. The underlying tissue is more vital-appearing and there is less slough. He does have substantial drainage. The small anterior tibial wound has a bit of slough accumulation. 06/23/2022: The left dorsal foot wound is about half the size as it was last week. There is just some light slough on the surface and some eschar buildup around the edges. The right anterior leg wound is small with a little slough on the surface. Unfortunately, the right medial leg wound deteriorated fairly substantially. It is malodorous with gray/green/black discoloration. 06/30/2022: The left dorsal foot wound continues to contract. It is nearly closed with just a light layer of slough present. The right anterior leg wound is about the same size and has a small amount of slough on the surface. The right medial leg wound looks a lot better than it did last week. The odor has abated and the tissue is less pulpy and necrotic-looking. There is still a fairly thick layer of slough present. 12/13; the left dorsal foot wound looks very healthy. There was no need to change the dressing here and no debridement. He has a small area on the right anterior lower leg apparently had not changed much in size. The area on the medial leg is the most problematic area this is large with some depth and a completely nonviable surface today. 07/14/2022: The left dorsal foot wound is smaller today with just a little slough and eschar present. The right anterior lower leg wound is about the same without any significant change. The medial leg wound has a black and green surface and is malodorous. No purulent drainage and the periwound skin is intact. Edema control is suboptimal. 07/21/2022: The dorsal foot wound continues to contract and is nearly closed with just  a little bit of slough and eschar on the surface.  The right anterior lower leg wound is shallower today but otherwise the dimensions are unchanged. The medial leg wound looks significantly better although it still has a nonviable surface; the odor and black/green surface has resolved. His culture came back with multiple species sensitive to Augmentin, which was prescribed and a very low level of Pseudomonas, which should be handled by the topical gentamicin we have been using. 07/28/2022: The dorsal foot wound is down to just a very tiny opening with a little eschar on the surface. The right anterior tibial wound is about the same with some slough buildup. The right medial leg wound looks quite a bit better this week. There is thick rubbery slough on the surface, but the odor and drainage has improved significantly. 08/04/2022: The dorsal foot wound is almost healed, but the periwound skin has broken down. This appears to be secondary to moisture and adhesive from the absorbent pad. The right anterior tibial wound is shallower. The right medial leg wound continues to improve. It is smaller, still with rubbery slough on the surface. No malodor or purulent drainage. Electronic Signature(s) Signed: 08/04/2022 1:26:00 PM By: Fredirick Maudlin MD FACS Entered By: Fredirick Maudlin on 08/04/2022 13:25:59 [name] ([id number]) [id number] Page 4 of 13 -------------------------------------------------------------------------------- Physical Exam Details Patient Name: Date of Service: [name], [name] 08/04/2022 12:30 PM Medical Record Number: [id number] Patient Account Number: [id number] Date of Birth/Sex: Treating RN: 1950/12/06 (72 y.o. M) Primary Care Provider: PA [name], NO Other Clinician: Referring Provider: Treating Provider/Extender: [name], [name] in Treatment: 25 Constitutional Hypertensive, asymptomatic. . . . no acute distress. Respiratory Normal work of breathing on room  air. Notes 08/04/2022: The dorsal foot wound is almost healed, but the periwound skin has broken down. This appears to be secondary to moisture and adhesive from the absorbent pad. The right anterior tibial wound is shallower. The right medial leg wound continues to improve. It is smaller, still with rubbery slough on the surface. No malodor or purulent drainage. Electronic Signature(s) Signed: 08/04/2022 1:26:29 PM By: Fredirick Maudlin MD FACS Entered By: Fredirick Maudlin on 08/04/2022 13:26:29 -------------------------------------------------------------------------------- Physician Orders Details Patient Name: Date of Service: [name], [name] 08/04/2022 12:30 PM Medical Record Number: [id number] Patient Account Number: [id number] Date of Birth/Sex: Treating RN: 1950/09/12 (72 y.o. [name] Primary Care Provider: PA [name], [place] Other Clinician: Referring Provider: Treating Provider/Extender: [name] in Treatment: 25 Verbal / Phone Orders: No Diagnosis Coding ICD-10 Coding Code Description [phone] 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d L97.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) I10 Essential (primary) hypertension I25.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 - Dr. [name] Rm 1 Return A ppointment in 2 weeks. - Dr. [name] Room 1 Return A Anesthetic Wound #1 Left,Dorsal Foot (In clinic) Topical Lidocaine 4% applied to wound bed Wound #3 Right,Medial Lower Leg (In clinic) Topical Lidocaine 4% applied to wound bed Wound #6 Right,Anterior Lower Leg DAMIR, LEUNG ([id number])  [id number] Page 5 of 13 (In clinic) Topical Lidocaine 4% applied to wound bed Edema Control - Lymphedema / SCD / Other Lymphedema Pumps. Use Lymphedema pumps on leg(s) 2-3 times a day for 45-60 minutes. If  wearing any wraps or hose, do not remove them. Continue exercising as instructed. - use 1-2 times per day over wraps Exercise regularly Wound Treatment Wound #1 - Foot Wound Laterality: Dorsal, Left Peri-Wound Care: Sween Lotion (Moisturizing lotion) 1 x Per Week/30 Days Discharge Instructions: Apply moisturizing lotion as directed Prim Dressing: Promogran Prisma Matrix, 4.34 (sq in) (silver collagen) 1 x Per Week/30 Days ary Discharge Instructions: Moisten collagen with saline or hydrogel Secondary Dressing: Woven Gauze Sponge, Non-Sterile 4x4 in 1 x Per Week/30 Days Discharge Instructions: Apply over primary dressing as directed. Compression Wrap: ThreePress (3 layer compression wrap) 1 x Per Week/30 Days Discharge Instructions: Apply three layer compression as directed. Wound #3 - Lower Leg Wound Laterality: Right, Medial Peri-Wound Care: Sween Lotion (Moisturizing lotion) 1 x Per Week/30 Days Discharge Instructions: Apply moisturizing lotion as directed Topical: Gentamicin 1 x Per Week/30 Days Discharge Instructions: As directed by physician Prim Dressing: Santyl Ointment 1 x Per Week/30 Days ary Discharge Instructions: Apply nickel thick amount to wound bed as instructed Secondary Dressing: ABD Pad, 8x10 1 x Per Week/30 Days Discharge Instructions: Apply over primary dressing as directed. Secondary Dressing: Woven Gauze Sponge, Non-Sterile 4x4 in 1 x Per Week/30 Days Discharge Instructions: Apply over primary dressing as directed. Secondary Dressing: Zetuvit Plus 4x8 in 1 x Per Week/30 Days Discharge Instructions: Apply over primary dressing as directed. Compression Wrap: ThreePress (3 layer compression wrap) 1 x Per Week/30 Days Discharge  Instructions: Apply three layer compression as directed. Wound #6 - Lower Leg Wound Laterality: Right, Anterior Peri-Wound Care: Sween Lotion (Moisturizing lotion) 1 x Per Week/30 Days Discharge Instructions: Apply moisturizing lotion as directed Prim Dressing: Promogran Prisma Matrix, 4.34 (sq in) (silver collagen) 1 x Per Week/30 Days ary Discharge Instructions: Moisten collagen with saline or hydrogel Secondary Dressing: ABD Pad, 8x10 1 x Per Week/30 Days Discharge Instructions: Apply over primary dressing as directed. Secondary Dressing: Woven Gauze Sponge, Non-Sterile 4x4 in 1 x Per Week/30 Days Discharge Instructions: Apply over primary dressing as directed. Secondary Dressing: Zetuvit Plus 4x8 in 1 x Per Week/30 Days Discharge Instructions: Apply over primary dressing as directed. Compression Wrap: ThreePress (3 layer compression wrap) 1 x Per Week/30 Days Discharge Instructions: Apply three layer compression as directed. Electronic Signature(s) Signed: 08/04/2022 1:49:22 PM By: Fredirick Maudlin MD FACS Signed: 08/06/2022 4:39:48 PM By: Blanche East RN Entered By: Blanche East on 08/04/2022 13:29:46 [name] ([id number]) [id number] Page 6 of 13 -------------------------------------------------------------------------------- Problem List Details Patient Name: Date of Service: [name], [name] 08/04/2022 12:30 PM Medical Record Number: [id number] Patient Account Number: [id number] Date of Birth/Sex: Treating RN: 30-Jun-1951 (72 y.o. 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 02/09/2022 No Yes 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) 02/09/2022 No Yes I10 Essential (primary) hypertension 02/09/2022 No Yes I25.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 02/09/2022 No Yes I89.0 Lymphedema, not elsewhere classified 02/09/2022 No Yes Z86.718 Personal history of other venous thrombosis and embolism 02/09/2022 No Yes Inactive Problems ICD-10 Code Description Active Date Inactive Date L97.512 Non-pressure chronic ulcer of other part of right foot with fat layer exposed 02/17/2022 02/17/2022 Resolved Problems Electronic  Signature(s) Signed: 08/04/2022 1:23:11 PM By: Fredirick Maudlin MD FACS Entered By: Fredirick Maudlin on 08/04/2022 13:23:11 [name] ([id number]) [id number] Page 7 of 13 -------------------------------------------------------------------------------- Progress Note Details Patient Name: Date of Service: [name], [name] 08/04/2022 12:30 PM Medical Record Number: [id number] Patient Account Number: [id number] Date of Birth/Sex: Treating RN: [birth year] (72 y.o. M) Primary Care Provider: PA [name], NO Other Clinician: Referring Provider: Treating Provider/Extender: [name] in Treatment: 25 Subjective Chief Complaint Information obtained from Patient Patient seen for complaints of Non-Healing Wounds. History of Present Illness (HPI) ADMISSION 02/09/2022 This is a 72 year old man who recently moved to [place] from [place]. He has a history of of coronary artery disease and prior left popliteal vein DVT . He is not diabetic and does not smoke. 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 He has had multiple spinal surgeries and has limited mobility. He primarily uses a motorized wheelchair. He has had lymphedema for many years and does have lymphedema pumps although he says he does not use them  frequently. He has wounds on his bilateral lower extremities. He was receiving care in [place] for these prior to his move. They were apparently packing his wounds with Betadine soaked gauze. He has worn compression wraps in the past but not recently. He did say that they helped tremendously. In clinic today, his right ABI is noncompressible but has good signals; the left ABI was 1.17. No formal vascular studies have been performed. On his left dorsal foot, there is a circular lesion that exposes the fat layer. There is fairly heavy slough accumulation within the wound, but no significant odor. On his right medial calf, he has multiple pinholes that have slough buildup in them and probe for about 2 to 4 mm in depth. They are draining clear fluid. On his right lateral midfoot, he has ulceration consistent with venous stasis. It is geographic and has slough accumulation. 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 02/17/2022: All of the wounds are little bit larger today and have more slough accumulation. Today, the medial calf openings are larger and probe to something that feels calcified. There is odor coming from his wounds. His vascular studies are scheduled for August 9 and 10. 02-24-2022 upon evaluation today patient presents for follow-up concerning his ongoing issues here with his lower extremities. With that being said he does have significant problems with what appears to be cellulitis unfortunately the PCR culture that was sent last week got crushed by [employer] in transit. With that being said we are going to reobtain a culture today although I am actually to do it on the right medial leg as this seems to be the most inflamed area today where there is some questionable drainage as well. I am going to remove some of the calcium deposits which are loosening obtain a deeper culture from this area. Fortunately I do not  see any evidence of active infection systemically which is good news but locally I think we are having a bigger issue here. He does have his appointment next Thursday with vascular. Patient has also been referred to [facility] for further evaluation and treatment of the calcinosis for possible sulfate injections. 03/04/2022: The culture that was performed last week grew out multiple species including Klebsiella, Morganella, and Pseudomonas aeruginosa. He had been prescribed Bactrim but this was not adequate to cover all of the species and levofloxacin was  recommended. He completed the Bactrim but did not pick up the levofloxacin and begin taking it until yesterday. 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 He had arterial studies performed today. He does have evidence of peripheral arterial disease. Results copied here: +-------+-----------+-----------+------------+------------+ ABI/TBIT oday's ABIT oday's TBIPrevious ABIPrevious TBI +-------+-----------+-----------+------------+------------+ Right [place] 0.75    +-------+-----------+-----------+------------+------------+ Left [place] 0.57    +-------+-----------+-----------+------------+------------+ Arterial wall calcification precludes accurate ankle pressures and ABIs. Summary: Right: Resting right ankle-brachial index indicates noncompressible right lower extremity arteries. The right toe-brachial index is normal. Left: Resting left ankle-brachial index indicates noncompressible left lower extremity arteries. The left toe-brachial index is abnormal. He continues to have further tissue breakdown at the right medial calf and there is ample slough accumulation along with necrotic subcutaneous tissue. The left dorsal foot wound has built up slough, as well. The right  medial foot wound is nearly closed with just a light layer of eschar over the surface. The right dorsal lateral foot wound has also accumulated a fair amount of slough and remains quite tender. 03/10/2022: He has been taking the prescribed levofloxacin and has about 3 days left of this. The erythema and induration on his right leg has improved. He continues to experience further breakdown of the right medial calf wound. There is extensive slough and debris accumulation there. There is heavy slough on his left dorsal foot wound, but no odor or purulent drainage. The right medial foot wound appears to be closed. The right dorsal lateral foot wound also has a fair amount of slough but it is less tender than at our last visit. He still does not have an appointment with dermatology. 03/22/2022: The right anterior tibial wound has closed. The right lateral foot wound is nearly closed with just a little bit of slough. The left dorsal foot wound is smaller and continues to have some slough buildup but less so than at prior visits. There is good granulation tissue underlying the slough. Unfortunately the right medial calf wound continues to deteriorate. It has a foul odor today and a lot of necrotic tissue, the surrounding skin is also erythematous and moist. [name], [name] ([id number]) [id number] Page 8 of 13 03/30/2022: The right lateral foot wound continues to contract and just has a little bit of slough and eschar present. The left dorsal foot wound is also smaller with slough overlying good granulation tissue. The right medial calf wound actually looks quite a bit better today; there is no odor and there is much less necrotic tissue although some remains present. We have been using topical gentamicin and he has been taking oral Augmentin. 04/07/2022: The patient saw dermatology at [facility] last week. Unfortunately, it appears that they did not read any of  the documentation that was provided. 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 Overall, it was a highly unsatisfactory consultation. In the meantime, however, we were able to identify compounding pharmacy who could create a topical sodium thiosulfate ointment. The patient has that with him today. Both of the right foot wounds are now closed. The left dorsal foot wound has contracted but still has a layer  of slough on the surface. The medial calf wounds on the right all look a little bit better. They still have heavy slough along with the chunks of calcium. Everything is stained purple. 04/14/2022: The wound on his dorsal left foot is a little bit smaller again today. There is still slough accumulation on the surface. The medial calf wounds have quite a bit less slough accumulation today. His right leg, however, is warm and erythematous, concerning for cellulitis. 04/14/2022: He completed his course of Augmentin yesterday. The medial calf wound sites are much cleaner today and shallower. There is less fragmented calcium in the wounds. He has a lot of lymphedema fluid-like drainage from these wounds, but no purulent discharge or malodor. The wound on his dorsal left foot has also contracted and is shallower. There is a layer of slough over healthy granulation tissue. 05/05/2022: The left dorsal foot wound is smaller today with less slough and more granulation tissue. The right medial calf wounds are shallower, but have extensive slough and some fat necrosis. The drainage has diminished considerably, however. 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 05/12/2022: The left dorsal foot wound continues to contract and fill with granulation tissue. There is slough on the wound surface. The right medial calf wounds also continue to fill with healthy tissue, but there is remaining slough and fat necrosis present. He had a significant increase in his drainage this week and it was a bright yellow-green color. He also had a new wound open over his right anterior tibial surface associated with the spike of cutaneous calcium. The leg is more red, as well, concerning for infection. 05/19/2022: His culture returned positive for Pseudomonas. He is currently taking a course of oral levofloxacin. We have also been using topical gentamicin mixed in with his sodium thiosulfate. All of the wounds look better this week. 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 There is slough and nonviable subcutaneous tissue still present. The new anterior tibial wound just has a light layer of slough. The dorsal foot wound also has some slough present and appears a little dry today. 05/26/2022: The right medial leg wound continues to improve. It is feeling with granulation tissue, but still accumulates a fairly thick layer of slough on the surface. The more recent anterior tibial wound has remained quite small, but also has some slough on the surface. The left dorsal foot wound has contracted somewhat and has perimeter epithelialization. He completed his oral levofloxacin. 06/02/2022: The left dorsal foot wound continues to improve significantly. There is just a little slough on the wound surface. The right medial leg wound had black drainage, but it looks like silver alginate was applied last week and I theorized that silver sulfide was created with a combination of the silver alginate and the sodium thiosulfate. It did, however, have a bit of an odor to it and extensive slough accumulation. The small anterior tibial wound also had a bit of  slough and nonviable subcutaneous tissue present. The skin of his leg was rather macerated, as well. 06/09/2022: The left dorsal foot wound is smaller again this week with just a light layer of slough on the surface. The right medial leg wound looks substantially better. The leg and periwound skin are no longer red and macerated. There is good granulation tissue forming with less slough and nonviable tissue. The anterior tibial  wound just has a small amount of slough accumulation. 06/16/2022: The left dorsal foot wound continues to contract. His wrap slipped a little bit, however, and his foot is more swollen. The right medial leg wound continues to improve. The underlying tissue is more vital-appearing and there is less slough. He does have substantial drainage. The small anterior tibial wound has a bit of slough accumulation. 06/23/2022: The left dorsal foot wound is about half the size as it was last week. There is just some light slough on the surface and some eschar buildup around the edges. The right anterior leg wound is small with a little slough on the surface. Unfortunately, the right medial leg wound deteriorated fairly substantially. It is malodorous with gray/green/black discoloration. 06/30/2022: The left dorsal foot wound continues to contract. It is nearly closed with just a light layer of slough present. The right anterior leg wound is about the same size and has a small amount of slough on the surface. The right medial leg wound looks a lot better than it did last week. The odor has abated and the tissue is less pulpy and necrotic-looking. There is still a fairly thick layer of slough present. 12/13; the left dorsal foot wound looks very healthy. There was no need to change the dressing here and no debridement. He has a small area on the right anterior lower leg apparently had not changed much in size. The area on the medial leg is the most problematic area this is large with some  depth and a completely nonviable surface today. 07/14/2022: The left dorsal foot wound is smaller today with just a little slough and eschar present. The right anterior lower leg wound is about the same without any significant change. The medial leg wound has a black and green surface and is malodorous. No purulent drainage and the periwound skin is intact. Edema control is suboptimal. 07/21/2022: The dorsal foot wound continues to contract and is nearly closed with just a little bit of slough and eschar on the surface. The right anterior lower leg wound is shallower today but otherwise the dimensions are unchanged. The medial leg wound looks significantly better although it still has a nonviable surface; the odor and black/green surface has resolved. His culture came back with multiple species sensitive to Augmentin, which was prescribed and a very low level of Pseudomonas, which should be handled by the topical gentamicin we have been using. 07/28/2022: The dorsal foot wound is down to just a very tiny opening with a little eschar on the surface. The right anterior tibial wound is about the same with some slough buildup. The right medial leg wound looks quite a bit better this week. There is thick rubbery slough on the surface, but the odor and drainage has improved significantly. 08/04/2022: The dorsal foot wound is almost healed, but the periwound skin has broken down. This appears to be secondary to moisture and adhesive from the absorbent pad. The right anterior tibial wound is shallower. The right medial leg wound continues to improve. It is smaller, still with rubbery slough on the surface. No malodor or purulent drainage. Patient History Information obtained from Patient, Chart. Family History Cancer - Mother, Heart Disease - Father,Mother, Hypertension - Father,Siblings, Lung Disease - Siblings, No family history of Diabetes, Hereditary Spherocytosis, Kidney Disease, Seizures, Stroke,  Thyroid Problems, Tuberculosis. Social History Former smoker - quit 1991, Marital Status - Married, Alcohol Use - Moderate, Drug Use - No History, Caffeine Use - Daily - coffee. Medical  History Eyes KOLBIE, LEPKOWSKI ([id number]) [id number] Page 9 of 13 Patient has history of Cataracts - right eye removed Denies history of Glaucoma, Optic Neuritis Ear/Nose/Mouth/Throat Denies history of Chronic sinus problems/congestion, Middle ear problems Hematologic/Lymphatic Patient has history of Anemia - macrocytic, Lymphedema Cardiovascular Patient has history of Congestive Heart Failure, Deep Vein Thrombosis - left leg, Hypertension, Peripheral Venous Disease Endocrine Denies history of Type I Diabetes, Type II Diabetes Genitourinary Denies history of End Stage Renal Disease Integumentary (Skin) Denies history of History of Burn Oncologic Denies history of Received Chemotherapy, Received Radiation Psychiatric Denies history of Anorexia/bulimia, Confinement Anxiety Hospitalization/Surgery History - cervical fusion. - lumbar surgery. - coronary stent placement. - lasik eye surgery. - hydrocele excision. Medical A Surgical History Notes nd Constitutional Symptoms (General Health) obesity Ear/Nose/Mouth/Throat hard of hearing Respiratory frozen diaphragm right Cardiovascular hyperlipidemia Musculoskeletal myositis, lumbar DDD, spinal stenosis, cervical facet joint syndrome Objective Constitutional Hypertensive, asymptomatic. no acute distress. Vitals Time Taken: 12:30 PM, Weight: 267 lbs, Temperature: 98.0 F, Pulse: 70 bpm, Respiratory Rate: 18 breaths/min, Blood Pressure: 181/70 mmHg. Respiratory Normal work of breathing on room air. General Notes: 08/04/2022: The dorsal foot wound is almost healed, but the periwound skin has broken down. This appears to be secondary to moisture and adhesive from the absorbent pad. The right anterior tibial wound is shallower.  The right medial leg wound continues to improve. It is smaller, still with rubbery slough on the surface. No malodor or purulent drainage. Integumentary (Hair, Skin) Wound #1 status is Open. Original cause of wound was Gradually Appeared. The date acquired was: 12/07/2021. The wound has been in treatment 25 weeks. The wound is located on the Left,Dorsal Foot. The wound measures 1cm length x 0.7cm width x 0.1cm depth; 0.55cm^2 area and 0.055cm^3 volume. There is Fat Layer (Subcutaneous Tissue) exposed. There is no tunneling or undermining noted. There is a medium amount of serosanguineous drainage noted. There is medium (34-66%) pink granulation within the wound bed. There is a medium (34-66%) amount of necrotic tissue within the wound bed including Adherent Slough. The periwound skin appearance had no abnormalities noted for texture. The periwound skin appearance exhibited: Maceration, Rubor, Erythema. The periwound skin appearance did not exhibit: Dry/Scaly, Hemosiderin Staining. The surrounding wound skin color is noted with erythema with red streaks. Wound #3 status is Open. Original cause of wound was Gradually Appeared. The date acquired was: 12/07/2021. The wound has been in treatment 25 weeks. The wound is located on the Right,Medial Lower Leg. The wound measures 2.9cm length x 4.5cm width x 0.4cm depth; 10.249cm^2 area and 4.1cm^3 volume. There is Fat Layer (Subcutaneous Tissue) exposed. There is tunneling at 4:00 with a maximum distance of 0.6cm. There is a large amount of serosanguineous drainage noted. Foul odor after cleansing was noted. The wound margin is distinct with the outline attached to the wound base. There is small (1-33%) pink, friable, hyper - granulation within the wound bed. There is a large (67-100%) amount of necrotic tissue within the wound bed including Eschar and Adherent Slough. The periwound skin appearance had no abnormalities noted for texture. The periwound skin  appearance exhibited: Maceration, Hemosiderin Staining, Rubor. The periwound skin appearance did not exhibit: Dry/Scaly, Erythema. Periwound temperature was noted as No Abnormality. The periwound has tenderness on palpation. Wound #6 status is Open. Original cause of wound was Gradually Appeared. The date acquired was: 05/12/2022. The wound has been in treatment 12 weeks. The wound is located on the Right,Anterior Lower Leg. The wound measures 0.2cm length  x 0.2cm width x 0.3cm depth; 0.031cm^2 area and 0.009cm^3 volume. There is Fat Layer (Subcutaneous Tissue) exposed. There is no tunneling or undermining noted. There is a medium amount of serosanguineous drainage noted. The wound margin is epibole. There is large (67-100%) pink granulation within the wound bed. There is a small (1-33%) amount of necrotic tissue within the wound bed including Adherent Slough. The periwound skin appearance had no abnormalities noted for texture. The periwound skin appearance had no abnormalities noted for moisture. The periwound skin appearance exhibited: Hemosiderin Staining. The periwound skin appearance did not exhibit: Atrophie Blanche, Cyanosis, Ecchymosis, Mottled, Pallor, Rubor, Erythema. Periwound temperature was noted as No Abnormality. The periwound has tenderness on palpation. 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 There was a Excisional Skin/Subcutaneous Tissue Debridement with a total area of 13.05 sq cm performed by Fredirick Maudlin, MD. With the following instrument(s): Curette to remove Viable and Non-Viable tissue/material. Material removed includes Subcutaneous Tissue and Slough and. No specimens were taken. A time out was conducted at 13:03, prior to the start of the procedure. A Minimum amount of bleeding was controlled with Pressure. The procedure was tolerated well. Post Debridement Measurements: 2.9cm length x 4.5cm width x 0.4cm depth; 4.1cm^3 volume. Character of Wound/Ulcer Post Debridement requires further debridement. Post procedure Diagnosis Wound #3: Same as Pre-Procedure General Notes: Scribed for Dr. [name] by Blanche East, RN. Pre-procedure diagnosis of Wound #3 is a Lymphedema located on the Right,Medial Lower Leg . There was a Three Layer Compression Therapy Procedure by Blanche East, RN. Post procedure Diagnosis Wound #3: Same as Pre-Procedure Wound #1 Pre-procedure diagnosis of Wound #1 is a Lymphedema located on the Left,Dorsal Foot . There was a Three Layer Compression Therapy Procedure by Blanche East, RN. Post procedure Diagnosis Wound #1: Same as Pre-Procedure Wound #6 Pre-procedure diagnosis of Wound #6 is a Calcinosis located on the Right,Anterior Lower Leg . There was a Three Layer Compression Therapy Procedure by Blanche East, RN. Post procedure Diagnosis Wound #6: Same as Pre-Procedure Plan Follow-up Appointments: Return Appointment in 1 week. - Dr. [name] Rm 1 Return Appointment in 2 weeks. - Dr. [name] Room 1 Anesthetic: Wound #1 Left,Dorsal Foot: (In clinic) Topical Lidocaine 4% applied to wound bed Wound #3 Right,Medial Lower Leg: (In clinic) Topical Lidocaine 4% applied to wound bed Wound #6 Right,Anterior Lower Leg: (In clinic) Topical Lidocaine 4% applied to wound bed Edema Control - Lymphedema / SCD / Other: Lymphedema Pumps.  Use Lymphedema pumps on leg(s) 2-3 times a day for 45-60 minutes. If wearing any wraps or hose, do not remove them. Continue exercising as instructed. - use 1-2 times per day over wraps Exercise regularly WOUND #1: - Foot Wound Laterality: Dorsal, Left Peri-Wound Care: Sween Lotion (Moisturizing lotion) 1 x Per Week/30 Days Discharge Instructions: Apply moisturizing lotion as directed Prim Dressing: Promogran Prisma Matrix, 4.34 (sq in) (silver collagen) 1 x Per Week/30 Days ary Discharge Instructions: Moisten collagen with saline or hydrogel Secondary Dressing: Woven Gauze Sponge, Non-Sterile 4x4 in 1 x Per  Week/30 Days Discharge Instructions: Apply over primary dressing as directed. Com pression Wrap: ThreePress (3 layer compression wrap) 1 x Per Week/30 Days Discharge Instructions: Apply three layer compression as directed. WOUND #3: - Lower Leg Wound Laterality: Right, Medial Peri-Wound Care: Sween Lotion (Moisturizing lotion) 1 x Per Week/30 Days Discharge Instructions: Apply moisturizing lotion as directed Topical: Gentamicin 1 x Per Week/30 Days Discharge Instructions: As directed by physician Prim Dressing: Santyl Ointment 1 x Per Week/30 Days ary Discharge Instructions: Apply nickel thick amount to wound bed as instructed Secondary Dressing: ABD Pad, 8x10 1 x Per Week/30 Days Discharge Instructions: Apply over primary dressing as directed. Secondary Dressing: Woven Gauze Sponge, Non-Sterile 4x4 in 1 x Per Week/30 Days Discharge Instructions: Apply over primary dressing as directed. Secondary Dressing: Zetuvit Plus 4x8 in 1 x Per Week/30 Days Discharge Instructions: Apply over primary dressing as directed. Com pression Wrap: ThreePress (3 layer compression wrap) 1 x Per Week/30 Days Discharge Instructions: Apply three layer compression as directed. WOUND #6: - Lower Leg Wound Laterality: Right, Anterior [name], FRAZER ([id number]) [id number] Page 11 of  13 Peri-Wound Care: Sween Lotion (Moisturizing lotion) 1 x Per Week/30 Days Discharge Instructions: Apply moisturizing lotion as directed Prim Dressing: Promogran Prisma Matrix, 4.34 (sq in) (silver collagen) 1 x Per Week/30 Days ary Discharge Instructions: Moisten collagen with saline or hydrogel Secondary Dressing: ABD Pad, 8x10 1 x Per Week/30 Days Discharge Instructions: Apply over primary dressing as directed. Secondary Dressing: Woven Gauze Sponge, Non-Sterile 4x4 in 1 x Per Week/30 Days Discharge Instructions: Apply over primary dressing as directed. Secondary Dressing: Zetuvit Plus 4x8 in 1 x Per Week/30 Days Discharge Instructions: Apply over primary dressing as directed. Com pression Wrap: ThreePress (3 layer compression wrap) 1 x Per Week/30 Days Discharge Instructions: Apply three layer compression as directed. 08/04/2022: The dorsal foot wound is almost healed, but the periwound skin has broken down. This appears to be secondary to moisture and adhesive from the absorbent pad. The right anterior tibial wound is shallower. The right medial leg wound continues to improve. It is smaller, still with rubbery slough on the surface. No malodor or purulent drainage. I used a curette to debride slough and nonviable subcutaneous tissue from the medial leg wound. It is clean enough now that I think we can resume using his topical compounded sodium thiosulfate. I am going to continue using the Santyl there, as well. Continue Prisma silver collagen to the other sites. We will change the absorbent layer on the left dorsal foot to something that does not have an adhesive and use copious periwound zinc oxide to minimize moisture related tissue breakdown. Bilateral 3 layer compression. Follow-up in 1 week. Electronic Signature(s) Signed: 08/05/2022 8:03:26 AM By: Fredirick Maudlin MD FACS Previous Signature: 08/04/2022 1:27:40 PM Version By: Fredirick Maudlin MD FACS Entered By: Fredirick Maudlin on  08/05/2022 08:03:26 -------------------------------------------------------------------------------- HxROS Details Patient Name: Date of Service: MARIE, CHOW [name] 08/04/2022 12:30 PM Medical Record Number: [id number] Patient Account Number: [id number] Date of Birth/Sex: Treating RN: 1950-08-27 (72 y.o. M) Primary Care Provider: PA [name], NO Other Clinician: Referring Provider: Treating Provider/Extender: [name] in Treatment: 25 Information Obtained From Patient Chart Constitutional Symptoms (General Health) Medical History: Past Medical History Notes: obesity Eyes Medical History: Positive for: Cataracts - right eye removed Negative for: Glaucoma; Optic Neuritis Ear/Nose/Mouth/Throat Medical History: Negative for: Chronic sinus problems/congestion; Middle ear problems Past Medical History Notes: hard of hearing Hematologic/Lymphatic Medical History: Positive for: Anemia - macrocytic; Lymphedema Respiratory Medical  History: Past Medical History Notes: frozen diaphragm right DICKY, BOER ([id number]) [id number] Page 12 of 13 Cardiovascular Medical History: Positive for: Congestive Heart Failure; Deep Vein Thrombosis - left leg; Hypertension; Peripheral Venous Disease Past Medical History Notes: hyperlipidemia Endocrine Medical History: Negative for: Type I Diabetes; Type II Diabetes Genitourinary Medical History: Negative for: End Stage Renal Disease Integumentary (Skin) Medical History: Negative for: History of Burn Musculoskeletal Medical History: Past Medical History Notes: myositis, lumbar DDD, spinal stenosis, cervical facet joint syndrome Oncologic Medical History: Negative for: Received Chemotherapy; Received Radiation Psychiatric Medical History: Negative for: Anorexia/bulimia; Confinement Anxiety HBO Extended History Items Eyes: Cataracts Immunizations Pneumococcal Vaccine: Received Pneumococcal  Vaccination: Yes Received Pneumococcal Vaccination On or After 60th Birthday: Yes Implantable Devices No devices added Hospitalization / Surgery History Type of Hospitalization/Surgery cervical fusion lumbar surgery coronary stent placement lasik eye surgery hydrocele excision Family and Social History Cancer: Yes - Mother; Diabetes: No; Heart Disease: Yes - Father,Mother; Hereditary Spherocytosis: No; Hypertension: Yes - Father,Siblings; Kidney Disease: No; Lung Disease: Yes - Siblings; Seizures: No; Stroke: No; Thyroid Problems: No; Tuberculosis: No; Former smoker - quit 1991; Marital Status - Married; Alcohol Use: Moderate; Drug Use: No History; Caffeine Use: Daily - coffee; Financial Concerns: No; Food, Clothing or Shelter Needs: No; Support System Lacking: No; Transportation Concerns: No Electronic Signature(s) Signed: 08/04/2022 1:49:22 PM By: Fredirick Maudlin MD FACS Entered By: Fredirick Maudlin on 08/04/2022 13:26:06 [name] ([id number]) [id number] Page 13 of 13 -------------------------------------------------------------------------------- SuperBill Details Patient Name: Date of Service: NIRVAN, LABAN 08/04/2022 Medical Record Number: [id number] Patient Account Number: [id number] Date of Birth/Sex: Treating RN: [birth year] (72 y.o. 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) I10 Essential (primary) hypertension I25.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 I89.0 Lymphedema, not elsewhere  classified Z86.718 Personal history of other venous thrombosis and embolism Facility Procedures : CPT4 Code: [id number] Description: 64332 - DEB SUBQ TISSUE 20 SQ CM/< ICD-10 Diagnosis Description R51.884 Non-pressure chronic ulcer of other part of right lower leg with fat layer exp 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 Modifier: osed Quantity: 1 Physician Procedures : CPT4 Code Description Modifier [id number] 16010 - WC PHYS LEVEL 4 - EST PT 25 ICD-10 Diagnosis Description 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d L97.522 Non-pressure chronic ulcer of other part of left  foot with fat layer exposed 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

## 2022-08-07 NOTE — Progress Notes (Signed)
[name], [name] ([id number]) [id number] Page 1 of 12 Visit Report for 08/04/2022 Arrival Information Details Patient Name: Date of Service: [name], [name] 08/04/2022 12:30 PM Medical Record Number: [id number] Patient Account Number: [id number] Date of Birth/Sex: Treating RN: 04-Feb-1951 (72 y.o. [name] Primary Care Jackelynn Hosie: PA [name], [place] Other Clinician: Referring Jhana Giarratano: Treating Travian Kerner/Extender: [name] in Treatment: 25 Visit Information History Since Last Visit Added or deleted any medications: No Patient Arrived: Wheel Chair Any new allergies or adverse reactions: No Arrival Time: 12:28 Had a fall or experienced change in No Accompanied By: self activities of daily living that may affect Transfer Assistance: None risk of falls: Patient Identification Verified: Yes Signs or symptoms of abuse/neglect since last visito No Secondary Verification Process Completed: Yes Hospitalized since last visit: No Patient Requires Transmission-Based Precautions: No Implantable device outside of the clinic excluding No Patient Has Alerts: Yes cellular tissue based products placed in the center Patient Alerts: R ABI= N/C, TBI= .75 since last visit: L ABI =N/C, TBI = .57 Has Compression in Place as Prescribed: Yes Pain Present Now: Yes Electronic Signature(s) Signed: 08/06/2022 4:39:48 PM By: Blanche East RN Entered By: Blanche East on 08/04/2022 12:32:08 -------------------------------------------------------------------------------- Compression Therapy Details Patient Name: Date of Service: [name], [name] 08/04/2022 12:30 PM Medical Record Number: [id number] Patient Account Number: [id number] Date of Birth/Sex: Treating RN: May 22, 1951 (72 y.o. [name] Primary Care Mckenzy Salazar: PA [name], [place] Other Clinician: Referring Labrea Eccleston: Treating Ahmari Garton/Extender: [name] in Treatment: 25 Compression  Therapy Performed for Wound Assessment: Wound #6 Right,Anterior Lower Leg Performed By: Clinician Blanche East, RN Compression Type: Three Layer Post Procedure Diagnosis Same as Pre-procedure Electronic Signature(s) Signed: 08/06/2022 4:39:48 PM By: Blanche East RN Entered By: Blanche East on 08/04/2022 13:35:21 [name] ([id number]) [id number] Page 2 of 12 -------------------------------------------------------------------------------- Compression Therapy Details Patient Name: Date of Service: [name], [name] 08/04/2022 12:30 PM Medical Record Number: [id number] Patient Account Number: [id number] Date of Birth/Sex: Treating RN: 1951/01/08 (72 y.o. [name] Primary Care Beniah Magnan: PA [name], [place] Other Clinician: Referring Eurika Sandy: Treating Arrington Yohe/Extender: [name] in Treatment: 25 Compression Therapy Performed for Wound Assessment: Wound #1 Left,Dorsal Foot Performed By: Clinician Blanche East, RN Compression Type: Three Layer Post Procedure Diagnosis Same as Pre-procedure Electronic Signature(s) Signed: 08/06/2022 4:39:48 PM By: Blanche East RN Entered By: Blanche East on 08/04/2022 13:35:21 -------------------------------------------------------------------------------- Compression Therapy Details Patient Name: Date of Service: [name], [name] 08/04/2022 12:30 PM Medical Record Number: [id number] Patient Account Number: [id number] Date of Birth/Sex: Treating RN: 1951-01-17 (72 y.o. [name] Primary Care Senna Lape: PA [name], [place] Other Clinician: Referring Marcianne Ozbun: Treating Luke Rigsbee/Extender: [name] in Treatment: 25 Compression Therapy Performed for Wound Assessment: Wound #3 Right,Medial Lower Leg Performed By: Clinician Blanche East, RN Compression Type: Three Layer Post Procedure Diagnosis Same as Pre-procedure Electronic Signature(s) Signed: 08/06/2022 4:39:48 PM By:  Blanche East RN Entered By: Blanche East on 08/04/2022 13:35:21 -------------------------------------------------------------------------------- Encounter Discharge Information Details Patient Name: Date of Service: [name], [name] 08/04/2022 12:30 PM Medical Record Number: [id number] Patient Account Number: [id number] Date of Birth/Sex: Treating RN: 02/28/1951 (72 y.o. [name] Primary Care Nitya Cauthon: PA [name], [place] Other Clinician: Referring Chontel Warning: Treating Elinora Weigand/Extender: [name] in Treatment: 25 Encounter Discharge Information Items Post Procedure Vitals Discharge Condition: Stable Temperature (F): 98.0 Ambulatory Status: Wheelchair Pulse (bpm): 70 Discharge Destination: Home Respiratory Rate (breaths/min): 18 Transportation: Private Auto Blood Pressure (mmHg): 181/70 Accompanied By:  self Schedule Follow-up Appointment: Yes Clinical Summary of Care: [name], [name] ([id number]) [id number] Page 3 of 12 Electronic Signature(s) Signed: 08/06/2022 4:39:48 PM By: Blanche East RN Entered By: Blanche East on 08/04/2022 13:32:44 -------------------------------------------------------------------------------- Lower Extremity Assessment Details Patient Name: Date of Service: [name], [name] 08/04/2022 12:30 PM Medical Record Number: [id number] Patient Account Number: [id number] Date of Birth/Sex: Treating RN: 05-Jan-1951 (72 y.o. [name] Primary Care Laurabeth Yip: PA [name], [place] Other Clinician: Referring Breklyn Fabrizio: Treating England Greb/Extender: Valeria Batman Weeks in Treatment: 25 Edema Assessment Assessed: [Left: No] [Right: No] Edema: [Left: Yes] [Right: Yes] Calf Left: Right: Point of Measurement: From Medial Instep 42 cm 52 cm Ankle Left: Right: Point of Measurement: From Medial Instep 25.5 cm 28 cm Vascular Assessment Pulses: Dorsalis Pedis Palpable: [Left:Yes] [Right:Yes] Electronic  Signature(s) Signed: 08/06/2022 4:39:48 PM By: Blanche East RN Entered By: Blanche East on 08/04/2022 12:45:01 -------------------------------------------------------------------------------- Multi Wound Chart Details Patient Name: Date of Service: [name] 08/04/2022 12:30 PM Medical Record Number: [id number] Patient Account Number: [id number] Date of Birth/Sex: Treating RN: 1950-09-18 (72 y.o. M) Primary Care Hilmer Aliberti: PA [name], [place] Other Clinician: Referring Camri Molloy: Treating Jami Ohlin/Extender: [name] in Treatment: 25 Vital Signs Height(in): Pulse(bpm): 70 Weight(lbs): 267 Blood Pressure(mmHg): 181/70 Body Mass Index(BMI): Temperature(F): 98.0 Respiratory Rate(breaths/min): 18 [1:Photos:] [[id number] Page 4 of 12] Left, Dorsal Foot Right, Medial Lower Leg Right, Anterior Lower Leg Wound Location: Gradually Appeared Gradually Appeared Gradually Appeared Wounding Event: Lymphedema Lymphedema Calcinosis Primary Etiology: Cataracts, Anemia, Lymphedema, Cataracts, Anemia, Lymphedema, Cataracts, Anemia, Lymphedema, Comorbid History: Congestive Heart Failure, Deep Vein Congestive Heart Failure, Deep Vein Congestive Heart Failure, Deep Vein Thrombosis, Hypertension, Peripheral Thrombosis, Hypertension, Peripheral Thrombosis, Hypertension, Peripheral Venous Disease Venous Disease Venous Disease 12/07/2021 12/07/2021 05/12/2022 Date Acquired: '25 25 12 '$ Weeks of Treatment: Open Open Open Wound Status: No No No Wound Recurrence: 1x0.7x0.1 2.9x4.5x0.4 0.2x0.2x0.3 Measurements L x W x D (cm) 0.55 10.249 0.031 A (cm) : rea 0.055 4.1 0.009 Volume (cm) : 85.50% -91.90% 86.90% % Reduction in A rea: 96.40% -91.90% 62.50% % Reduction in Volume: 4 Position 1 (o'clock): 0.6 Maximum Distance 1 (cm): No Yes No Tunneling: Full Thickness Without Exposed Full Thickness With Exposed Support Full Thickness Without  Exposed Classification: Support Structures Structures Support Structures Medium Large Medium Exudate A mount: Serosanguineous Serosanguineous Serosanguineous Exudate Type: red, brown red, brown red, brown Exudate Color: N/A Yes No Foul Odor A Cleansing: fter N/A No N/A Odor Anticipated Due to Product Use: N/A Distinct, outline attached Epibole Wound Margin: Medium (34-66%) Small (1-33%) Large (67-100%) Granulation A mount: Pink Pink, Hyper-granulation, Friable Pink Granulation Quality: Medium (34-66%) Large (67-100%) Small (1-33%) Necrotic Amount: Adherent Slough Eschar, Adherent Slough Adherent Slough Necrotic Tissue: Fat Layer (Subcutaneous Tissue): Yes Fat Layer (Subcutaneous Tissue): Yes Fat Layer (Subcutaneous Tissue): Yes Exposed Structures: Fascia: No Fascia: No Fascia: No Tendon: No Tendon: No Tendon: No Muscle: No Muscle: No Muscle: No Joint: No Joint: No Joint: No Bone: No Bone: No Bone: No Small (1-33%) Small (1-33%) None Epithelialization: N/A Debridement - Excisional N/A Debridement: Pre-procedure Verification/Time Out N/A 13:03 N/A Taken: N/A Subcutaneous, Slough N/A Tissue Debrided: N/A Skin/Subcutaneous Tissue N/A Level: N/A 13.05 N/A Debridement A (sq cm): rea N/A Curette N/A Instrument: N/A Minimum N/A Bleeding: N/A Pressure N/A Hemostasis A chieved: N/A Procedure was tolerated well N/A Debridement Treatment Response: N/A 2.9x4.5x0.4 N/A Post Debridement Measurements L x W x D (cm) N/A 4.1 N/A Post Debridement Volume: (cm) No Abnormalities Noted Excoriation: Yes Excoriation: No Periwound  Skin Texture: Induration: No Callus: No Crepitus: No Rash: No Scarring: No Maceration: Yes Maceration: Yes Maceration: No Periwound Skin Moisture: Dry/Scaly: No Dry/Scaly: No Dry/Scaly: No Erythema: Yes Hemosiderin Staining: Yes Hemosiderin Staining: Yes Periwound Skin Color: Rubor: Yes Rubor: Yes Atrophie Blanche:  No Hemosiderin Staining: No Erythema: No Cyanosis: No Ecchymosis: No Erythema: No Mottled: No Pallor: No Rubor: No Red Streaks N/A N/A Erythema Location: N/A No Abnormality No Abnormality Temperature: N/A Yes Yes Tenderness on Palpation: N/A Debridement N/A Procedures Performed: Treatment Notes Electronic Signature(s) Signed: 08/04/2022 1:23:18 PM By: Fredirick Maudlin MD [place] ([id number]) [id number] Page 5 of 12 Signed: 08/04/2022 1:23:18 PM By: Fredirick Maudlin MD FACS Entered By: Fredirick Maudlin on 08/04/2022 13:23:18 -------------------------------------------------------------------------------- Multi-Disciplinary Care Plan Details Patient Name: Date of Service: [name], [name] 08/04/2022 12:30 PM Medical Record Number: [id number] Patient Account Number: [id number] Date of Birth/Sex: Treating RN: 1951/03/27 (72 y.o. [name] Primary Care Phoebie Shad: PA [name], [place] Other Clinician: Referring Moo Gravley: Treating Kolbee Stallman/Extender: [name] in Treatment: 25 Multidisciplinary Care Plan reviewed with physician Active Inactive Venous Leg Ulcer Nursing Diagnoses: Knowledge deficit related to disease process and management Potential for venous Insuffiency (use before diagnosis confirmed) Goals: Patient will maintain optimal edema control Date Initiated: 02/17/2022 Target Resolution Date: 08/04/2022 Goal Status: Active Interventions: Assess peripheral edema status every visit. Compression as ordered Provide education on venous insufficiency Treatment Activities: Non-invasive vascular studies : 02/17/2022 Therapeutic compression applied : 02/17/2022 Notes: Wound/Skin Impairment Nursing Diagnoses: Impaired tissue integrity Knowledge deficit related to ulceration/compromised skin integrity Goals: Patient/caregiver will verbalize understanding of skin care regimen Date Initiated: 02/17/2022 Target Resolution  Date: 08/04/2022 Goal Status: Active Ulcer/skin breakdown will have a volume reduction of 30% by week 4 Date Initiated: 02/17/2022 Date Inactivated: 03/10/2022 Target Resolution Date: 03/10/2022 Unmet Reason: infection being treated, Goal Status: Unmet waiting on derm appte Ulcer/skin breakdown will have a volume reduction of 50% by week 8 Date Initiated: 03/10/2022 Date Inactivated: 04/14/2022 Target Resolution Date: 04/07/2022 Goal Status: Unmet Unmet Reason: calcinosis Ulcer/skin breakdown will have a volume reduction of 80% by week 12 Date Initiated: 04/14/2022 Date Inactivated: 05/05/2022 Target Resolution Date: 05/05/2022 Unmet Reason: infection, chronic Goal Status: Unmet condition Interventions: Assess patient/caregiver ability to obtain necessary supplies Assess patient/caregiver ability to perform ulcer/skin care regimen upon admission and as needed Assess ulceration(s) every visit Provide education on ulcer and skin care Treatment Activities: Skin care regimen initiated : 02/17/2022 [name], [name] ([id number]) [id number] Page 6 of 12 Topical wound management initiated : 02/17/2022 Notes: Electronic Signature(s) Signed: 08/06/2022 4:39:48 PM By: Blanche East RN Entered By: Blanche East on 08/04/2022 13:31:40 -------------------------------------------------------------------------------- Pain Assessment Details Patient Name: Date of Service: [name], [name] 08/04/2022 12:30 PM Medical Record Number: [id number] Patient Account Number: [id number] Date of Birth/Sex: Treating RN: May 01, 1951 (72 y.o. [name] Primary Care Tiphany Fayson: PA [name], [place] Other Clinician: Referring Daemian Gahm: Treating Tashawn Laswell/Extender: [name] in Treatment: 25 Active Problems Location of Pain Severity and Description of Pain Patient Has Paino No Site Locations Rate the pain. Current Pain Level: 0 Pain Management and Medication Current Pain  Management: Electronic Signature(s) Signed: 08/06/2022 4:39:48 PM By: Blanche East RN Entered By: Blanche East on 08/04/2022 12:33:03 -------------------------------------------------------------------------------- Patient/Caregiver Education Details Patient Name: Date of Service: LEUL, NARRAMORE 1/10/2024andnbsp12:30 PM Medical Record Number: [id number] Patient Account Number: [id number] Date of Birth/Gender: Treating RN: [birth year] (72 y.o. [name] Primary Care Physician: PA [name], [place] Other Clinician: Referring Physician: Treating Physician/Extender: [name],  [name] in Treatment: [address] FREAD, KOTTKE ([id number]) [id number] Page 7 of 12 Education Provided To: Patient Education Topics Provided Wound Debridement: Methods: Explain/Verbal Responses: State content correctly Wound/Skin Impairment: Methods: Explain/Verbal Responses: Reinforcements needed, State content correctly Electronic Signature(s) Signed: 08/06/2022 4:39:48 PM By: Blanche East RN Entered By: Blanche East on 08/04/2022 13:32:01 -------------------------------------------------------------------------------- Wound Assessment Details Patient Name: Date of Service: [name], [name] 08/04/2022 12:30 PM Medical Record Number: [id number] Patient Account Number: [id number] Date of Birth/Sex: Treating RN: 1950/10/24 (72 y.o. [name] Primary Care Halford Goetzke: PA [name], [place] Other Clinician: Referring Jacki Couse: Treating Clairissa Valvano/Extender: [name] in Treatment: 25 Wound Status Wound Number: 1 Primary Lymphedema Etiology: Wound Location: Left, Dorsal Foot Wound Open Wounding Event: Gradually Appeared Status: Date Acquired: 12/07/2021 Comorbid Cataracts, Anemia, Lymphedema, Congestive Heart Failure, Deep Weeks Of Treatment: 25 History: Vein Thrombosis, Hypertension, Peripheral Venous Disease Clustered Wound:  No Photos Wound Measurements Length: (cm) Width: (cm) Depth: (cm) Area: (cm) Volume: (cm) 1 % Reduction in Area: 85.5% 0.7 % Reduction in Volume: 96.4% 0.1 Epithelialization: Small (1-33%) 0.55 Tunneling: No 0.055 Undermining: No Wound Description Classification: Full Thickness Without Exposed Sup Exudate Amount: Medium Exudate Type: Serosanguineous Exudate Color: red, brown port Structures Wound Bed Granulation Amount: Medium (34-66%) Exposed Structure Sherry, Revin ([id number]) [id number] Page 8 of 12 Granulation Quality: Pink Fascia Exposed: No Necrotic Amount: Medium (34-66%) Fat Layer (Subcutaneous Tissue) Exposed: Yes Necrotic Quality: Adherent Slough Tendon Exposed: No Muscle Exposed: No Joint Exposed: No Bone Exposed: No Periwound Skin Texture Texture Color No Abnormalities Noted: Yes No Abnormalities Noted: No Erythema: Yes Moisture Erythema Location: Red Streaks No Abnormalities Noted: No Hemosiderin Staining: No Dry / Scaly: No Rubor: Yes Maceration: Yes Treatment Notes Wound #1 (Foot) Wound Laterality: Dorsal, Left Cleanser Peri-Wound Care Sween Lotion (Moisturizing lotion) Discharge Instruction: Apply moisturizing lotion as directed Topical Primary Dressing Promogran Prisma Matrix, 4.34 (sq in) (silver collagen) Discharge Instruction: Moisten collagen with saline or hydrogel Secondary Dressing Woven Gauze Sponge, Non-Sterile 4x4 in Discharge Instruction: Apply over primary dressing as directed. Secured With Compression Wrap ThreePress (3 layer compression wrap) Discharge Instruction: Apply three layer compression as directed. Compression Stockings Add-Ons Electronic Signature(s) Signed: 08/06/2022 4:39:48 PM By: Blanche East RN Entered By: Blanche East on 08/04/2022 12:47:51 -------------------------------------------------------------------------------- Wound Assessment Details Patient Name: Date of  Service: [name], [name] 08/04/2022 12:30 PM Medical Record Number: [id number] Patient Account Number: [id number] Date of Birth/Sex: Treating RN: [birth year] (72 y.o. [name] Primary Care Trysten Berti: PA [name], [place] Other Clinician: Referring Jayro Mcmath: Treating Jacquelinne Speak/Extender: Valeria Batman Weeks in Treatment: 25 Wound Status Wound Number: 3 Primary Lymphedema Etiology: Wound Location: Right, Medial Lower Leg Wound Open Wounding Event: Gradually Appeared Status: Date Acquired: 12/07/2021 Comorbid Cataracts, Anemia, Lymphedema, Congestive Heart Failure, Deep Weeks Of Treatment: 25 History: Vein Thrombosis, Hypertension, Peripheral Venous Disease Clustered Wound: No Photos PHILO, KURTZ ([id number]) [id number] Page 9 of 12 Wound Measurements Length: (cm) 2.9 Width: (cm) 4.5 Depth: (cm) 0.4 Area: (cm) 10.249 Volume: (cm) 4.1 % Reduction in Area: -91.9% % Reduction in Volume: -91.9% Epithelialization: Small (1-33%) Tunneling: Yes Position (o'clock): 4 Maximum Distance: (cm) 0.6 Wound Description Classification: Full Thickness With Exposed Suppo Wound Margin: Distinct, outline attached Exudate Amount: Large Exudate Type: Serosanguineous Exudate Color: red, brown rt Structures Foul Odor After Cleansing: Yes Due to Product Use: No Slough/Fibrino Yes Wound Bed Granulation Amount: Small (1-33%) Exposed Structure Granulation Quality: Pink, Hyper-granulation, Friable Fascia Exposed: No Necrotic Amount: Large (67-100%) Fat Layer (Subcutaneous Tissue)  Exposed: Yes Necrotic Quality: Eschar, Adherent Slough Tendon Exposed: No Muscle Exposed: No Joint Exposed: No Bone Exposed: No Periwound Skin Texture Texture Color No Abnormalities Noted: Yes No Abnormalities Noted: No Erythema: No Moisture Hemosiderin Staining: Yes No Abnormalities Noted: No Rubor: Yes Dry / Scaly: No Maceration: Yes Temperature / Pain Temperature: No  Abnormality Tenderness on Palpation: Yes Treatment Notes Wound #3 (Lower Leg) Wound Laterality: Right, Medial Cleanser Peri-Wound Care Sween Lotion (Moisturizing lotion) Discharge Instruction: Apply moisturizing lotion as directed Topical Gentamicin Discharge Instruction: As directed by physician Primary Dressing Santyl Ointment Discharge Instruction: Apply nickel thick amount to wound bed as instructed Secondary Dressing ABD Pad, 8x10 Discharge Instruction: Apply over primary dressing as directed. Woven Gauze Sponge, Non-Sterile 4x4 in Discharge Instruction: Apply over primary dressing as directed. Zetuvit Plus 4x8 in Discharge Instruction: Apply over primary dressing as directed. ZAYVIAN, MCMURTRY ([id number]) [id number] Page 10 of 12 Secured With Compression Wrap ThreePress (3 layer compression wrap) Discharge Instruction: Apply three layer compression as directed. Compression Stockings Add-Ons Electronic Signature(s) Signed: 08/06/2022 4:39:48 PM By: Blanche East RN Entered By: Blanche East on 08/04/2022 12:50:00 -------------------------------------------------------------------------------- Wound Assessment Details Patient Name: Date of Service: ROMON, DEVEREUX 08/04/2022 12:30 PM Medical Record Number: [id number] Patient Account Number: [id number] Date of Birth/Sex: Treating RN: Jul 14, 1951 (72 y.o. [name] Primary Care Sun Wilensky: PA [name], [place] Other Clinician: Referring Adine Heimann: Treating Nemesio Castrillon/Extender: [name] in Treatment: 25 Wound Status Wound Number: 6 Primary Calcinosis Etiology: Wound Location: Right, Anterior Lower Leg Wound Open Wounding Event: Gradually Appeared Status: Date Acquired: 05/12/2022 Comorbid Cataracts, Anemia, Lymphedema, Congestive Heart Failure, Deep Weeks Of Treatment: 12 History: Vein Thrombosis, Hypertension, Peripheral Venous Disease Clustered Wound: No Photos Wound  Measurements Length: (cm) 0.2 Width: (cm) 0.2 Depth: (cm) 0.3 Area: (cm) 0.031 Volume: (cm) 0.009 % Reduction in Area: 86.9% % Reduction in Volume: 62.5% Epithelialization: None Tunneling: No Undermining: No Wound Description Classification: Full Thickness Without Exposed Suppor Wound Margin: Epibole Exudate Amount: Medium Exudate Type: Serosanguineous Exudate Color: red, brown t Structures Foul Odor After Cleansing: No Slough/Fibrino Yes Wound Bed Granulation Amount: Large (67-100%) Exposed Structure Granulation Quality: Pink Fascia Exposed: No Necrotic Amount: Small (1-33%) Fat Layer (Subcutaneous Tissue) Exposed: Yes Necrotic Quality: Adherent Slough Tendon Exposed: No Muscle Exposed: No Opie, Quaron ([id number]) [id number] Page 11 of 12 Joint Exposed: No Bone Exposed: No Periwound Skin Texture Texture Color No Abnormalities Noted: Yes No Abnormalities Noted: No Atrophie Blanche: No Moisture Cyanosis: No No Abnormalities Noted: Yes Ecchymosis: No Erythema: No Hemosiderin Staining: Yes Mottled: No Pallor: No Rubor: No Temperature / Pain Temperature: No Abnormality Tenderness on Palpation: Yes Treatment Notes Wound #6 (Lower Leg) Wound Laterality: Right, Anterior Cleanser Peri-Wound Care Sween Lotion (Moisturizing lotion) Discharge Instruction: Apply moisturizing lotion as directed Topical Primary Dressing Promogran Prisma Matrix, 4.34 (sq in) (silver collagen) Discharge Instruction: Moisten collagen with saline or hydrogel Secondary Dressing ABD Pad, 8x10 Discharge Instruction: Apply over primary dressing as directed. Woven Gauze Sponge, Non-Sterile 4x4 in Discharge Instruction: Apply over primary dressing as directed. Zetuvit Plus 4x8 in Discharge Instruction: Apply over primary dressing as directed. Secured With Compression Wrap ThreePress (3 layer compression wrap) Discharge Instruction: Apply three layer compression  as directed. Compression Stockings Add-Ons Electronic Signature(s) Signed: 08/06/2022 4:39:48 PM By: Blanche East RN Entered By: Blanche East on 08/04/2022 12:51:30 -------------------------------------------------------------------------------- Vitals Details Patient Name: Date of Service: [name] 08/04/2022 12:30 PM Medical Record Number: [id number] Patient Account Number: [id number] Date of Birth/Sex: Treating RN: 1951/07/05 (71  y.o. Jerilynn Mages) Blanche East Primary Care Keatin Benham: PA [name], [place] Other Clinician: Referring Cabell Lazenby: Treating Eldwin Volkov/Extender: [name] in Treatment: [address] BART, ASHFORD ([id number]) [id number] Page 12 of 12 Time Taken: 12:30 Temperature (F): 98.0 Weight (lbs): 267 Pulse (bpm): 70 Respiratory Rate (breaths/min): 18 Blood Pressure (mmHg): 181/70 Reference Range: 80 - 120 mg / dl Electronic Signature(s) Signed: 08/06/2022 4:39:48 PM By: Blanche East RN Entered By: Blanche East on 08/04/2022 12:32:52

## 2022-08-11 ENCOUNTER — Encounter (HOSPITAL_BASED_OUTPATIENT_CLINIC_OR_DEPARTMENT_OTHER): Payer: Medicare Other | Admitting: General Surgery

## 2022-08-11 DIAGNOSIS — I890 Lymphedema, not elsewhere classified: Secondary | ICD-10-CM | POA: Diagnosis not present

## 2022-08-11 DIAGNOSIS — L942 Calcinosis cutis: Secondary | ICD-10-CM | POA: Diagnosis not present

## 2022-08-11 DIAGNOSIS — L97522 Non-pressure chronic ulcer of other part of left foot with fat layer exposed: Secondary | ICD-10-CM | POA: Diagnosis not present

## 2022-08-11 DIAGNOSIS — L97812 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d: Secondary | ICD-10-CM | POA: Diagnosis not present

## 2022-08-11 DIAGNOSIS — I872 Venous insufficiency (chronic) (peripheral): Secondary | ICD-10-CM | POA: Diagnosis not present

## 2022-08-11 DIAGNOSIS — I251 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: Secondary | ICD-10-CM | POA: Diagnosis not present

## 2022-08-11 NOTE — Progress Notes (Signed)
[name], [name] ([id number]) [id number] Page 1 of 12 Visit Report for 08/11/2022 Arrival Information Details Patient Name: Date of Service: [name], [name] 08/11/2022 12:30 PM Medical Record Number: [id number] Patient Account Number: [id number] Date of Birth/Sex: Treating RN: [name] (72 y.o. [name] Primary Care [name]: PA [name], [place] Other Clinician: Referring [name]: Treating [name]/Extender: [name] in Treatment: 26 Visit Information History Since Last Visit Added or deleted any medications: [name] Patient Arrived: Wheel Chair Any new allergies or adverse reactions: [name] Arrival Time: 12:39 Had a fall or experienced change in [name] Accompanied By: self activities of daily living that may affect Transfer Assistance: None risk of falls: Patient Identification Verified: Yes Signs or symptoms of abuse/neglect since last visito [name] Secondary Verification Process Completed: Yes Hospitalized since last visit: [name] Patient Requires Transmission-Based Precautions: [name] Implantable device outside of the clinic excluding [name] Patient Has Alerts: Yes cellular tissue based products placed in the center Patient Alerts: R ABI= N/C, TBI= .75 since last visit: L ABI =N/C, TBI = .57 Has Dressing in Place as Prescribed: Yes Has Compression in Place as Prescribed: Yes Pain Present Now: [name] Electronic Signature(s) Signed: 08/11/2022 5:34:06 PM By: Baruch Gouty RN, BSN Entered By: Baruch Gouty on 08/11/2022 12:40:02 -------------------------------------------------------------------------------- Compression Therapy Details Patient Name: Date of Service: [name] 08/11/2022 12:30 PM Medical Record Number: [id number] Patient Account Number: [id number] Date of Birth/Sex: Treating RN: [birth year] (72 y.o. [name] Primary Care Josue Kass: PA [name], [place] Other Clinician: Referring [name]: Treating [name]/Extender: [name] in Treatment: 26 Compression Therapy Performed for Wound Assessment: Wound #1 Left,Dorsal Foot Performed By: Clinician Baruch Gouty, RN Compression Type: Three Layer Post Procedure Diagnosis Same as Pre-procedure Electronic Signature(s) Signed: 08/11/2022 5:34:06 PM By: Baruch Gouty RN, BSN Entered By: Baruch Gouty on 08/11/2022 13:09:03 [name] ([id number]) [id number] Page 2 of 12 -------------------------------------------------------------------------------- Compression Therapy Details Patient Name: Date of Service: [name] 08/11/2022 12:30 PM Medical Record Number: [id number] Patient Account Number: [id number] Date of Birth/Sex: Treating RN: [name] (72 y.o. [name] Primary Care Nasteho Glantz: PA [name], [place] Other Clinician: Referring [name]: Treating [name]/Extender: [name] in Treatment: 26 Compression Therapy Performed for Wound Assessment: Wound #3 Right,Medial Lower Leg Performed By: Clinician Baruch Gouty, RN Compression Type: Three Layer Post Procedure Diagnosis Same as Pre-procedure Electronic Signature(s) Signed: 08/11/2022 5:34:06 PM By: Baruch Gouty RN, BSN Entered By: Baruch Gouty on 08/11/2022 13:09:03 -------------------------------------------------------------------------------- Encounter Discharge Information Details Patient Name: Date of Service: [name], [name] 08/11/2022 12:30 PM Medical Record Number: [id number] Patient Account Number: [id number] Date of Birth/Sex: Treating RN: [birth year][name] (72 y.o. [name] Primary Care [name]: PA [name], [place] Other Clinician: Referring [name]: Treating [name]/Extender: [name] in Treatment: 26 Encounter Discharge Information Items Post Procedure Vitals Discharge Condition: Stable Temperature (F): 97.8 Ambulatory Status: Wheelchair Pulse (bpm): 62 Discharge  Destination: Home Respiratory Rate (breaths/min): 18 Transportation: Private Auto Blood Pressure (mmHg): 163/79 Accompanied By: self Schedule Follow-up Appointment: Yes Clinical Summary of Care: Patient Declined Electronic Signature(s) Signed: 08/11/2022 5:34:06 PM By: Baruch Gouty RN, BSN Entered By: Baruch Gouty on 08/11/2022 14:35:48 -------------------------------------------------------------------------------- Lower Extremity Assessment Details Patient Name: Date of Service: [name] 08/11/2022 12:30 PM Medical Record Number: [id number] Patient Account Number: [id number] Date of Birth/Sex: Treating RN: [birth year][name] (72 y.o. [name] Primary Care Magon Croson: PA [name], [place] Other Clinician: Referring [name]: Treating [name]/Extender: [name] in Treatment: 26 Edema Assessment Assessed: [Left:  [name]] [Right: [name]] Edema: [Left: Yes] [Right: Yes] [name], [name] ([id number]) [id number] Page 3 of 12 Left: Right: Point of Measurement: From Medial Instep 44 cm 52 cm Ankle Left: Right: Point of Measurement: From Medial Instep 27.5 cm 28 cm Vascular Assessment Pulses: Dorsalis Pedis Palpable: [Left:[name]] [Right:[name]] Electronic Signature(s) Signed: 08/11/2022 5:34:06 PM By: Baruch Gouty RN, BSN Entered By: Baruch Gouty on 08/11/2022 12:47:59 -------------------------------------------------------------------------------- Multi Wound Chart Details Patient Name: Date of Service: [name] 08/11/2022 12:30 PM Medical Record Number: [id number] Patient Account Number: [id number] Date of Birth/Sex: Treating RN: [name] (72 y.o. M) Primary Care [name]: PA [name], [name] Other Clinician: Referring Mussa Groesbeck: Treating Aziel Morgan/Extender: [name], [name] in Treatment: 26 Vital Signs Height(in): Pulse(bpm): 71 Weight(lbs): 267 Blood Pressure(mmHg): 163/79 Body Mass  Index(BMI): Temperature(F): 97.8 Respiratory Rate(breaths/min): 18 [1:Photos:] Left, Dorsal Foot Right, Medial Lower Leg Right, Anterior Lower Leg Wound Location: Gradually Appeared Gradually Appeared Gradually Appeared Wounding Event: Lymphedema Lymphedema Calcinosis Primary Etiology: Cataracts, Anemia, Lymphedema, Cataracts, Anemia, Lymphedema, Cataracts, Anemia, Lymphedema, Comorbid History: Congestive Heart Failure, Deep Vein Congestive Heart Failure, Deep Vein Congestive Heart Failure, Deep Vein Thrombosis, Hypertension, Peripheral Thrombosis, Hypertension, Peripheral Thrombosis, Hypertension, Peripheral Venous Disease Venous Disease Venous Disease 12/07/2021 12/07/2021 05/12/2022 Date Acquired: '26 26 13 '$ [name] of Treatment: Open Open Open Wound Status: [name] [name] [name] Wound Recurrence: 1.2x1.2x0.1 3x5x0.8 0.3x0.2x0.3 Measurements L x W x D (cm) 1.131 11.781 0.047 A (cm) : rea 0.113 9.425 0.014 Volume (cm) : 70.20% -120.60% 80.10% % Reduction in Area: 92.60% -341.20% 41.70% % Reduction in Volume: Full Thickness Without Exposed Full Thickness With Exposed Support Full Thickness Without Exposed Classification: Support Structures Structures Support Structures Medium Large Medium Exudate Amount: Serous Serosanguineous Purulent Exudate Type: amber red, brown yellow, brown, green Exudate Color: Flat and Intact Distinct, outline attached Epibole Wound Margin: Medium (34-66%) Small (1-33%) Large (67-100%) Granulation Amount: [name], [name] ([id number]) [id number] Page 4 of 12 Red, Pink Pink Red Granulation Quality: Medium (34-66%) Large (67-100%) None Present (0%) Necrotic Amount: Fat Layer (Subcutaneous Tissue): Yes Fat Layer (Subcutaneous Tissue): Yes Fat Layer (Subcutaneous Tissue): Yes Exposed Structures: Fascia: [name] Fascia: [name] Fascia: [name] Tendon: [name] Tendon: [name] Tendon: [name] Muscle: [name] Muscle: [name] Muscle: [name] Joint: [name] Joint: [name] Joint:  [name] Bone: [name] Bone: [name] Bone: [name] Small (1-33%) None None Epithelialization: Debridement - Excisional Debridement - Excisional N/A Debridement: Pre-procedure Verification/Time Out 13:10 13:10 N/A Taken: Lidocaine 5% topical ointment Lidocaine 5% topical ointment N/A Pain Control: Subcutaneous, Slough Other, Subcutaneous, Slough N/A Tissue Debrided: Skin/Subcutaneous Tissue Skin/Subcutaneous Tissue N/A Level: 1.44 15 N/A Debridement A (sq cm): rea Curette Curette N/A Instrument: Minimum Minimum N/A Bleeding: Pressure Pressure N/A Hemostasis A chieved: 1 4 N/A Procedural Pain: 0 1 N/A Post Procedural Pain: Procedure was tolerated well Procedure was tolerated well N/A Debridement Treatment Response: 1.2x1.2x0.1 3x5x0.8 N/A Post Debridement Measurements L x W x D (cm) 0.113 9.425 N/A Post Debridement Volume: (cm) [name] Abnormalities Noted Excoriation: Yes Excoriation: [name] Periwound Skin Texture: Induration: [name] Callus: [name] Crepitus: [name] Rash: [name] Scarring: [name] Maceration: [name] Maceration: [name] Maceration: [name] Periwound Skin Moisture: Dry/Scaly: [name] Dry/Scaly: [name] Dry/Scaly: [name] Erythema: Yes Hemosiderin Staining: Yes Hemosiderin Staining: Yes Periwound Skin Color: Hemosiderin Staining: [name] Rubor: Yes Atrophie Blanche: [name] Rubor: [name] Erythema: [name] Cyanosis: [name] Ecchymosis: [name] Erythema: [name] Mottled: [name] Pallor: [name] Rubor: [name] Red Streaks N/A N/A Erythema Location: [name] Abnormality [name] Abnormality [name] Abnormality Temperature: N/A Yes N/A Tenderness on Palpation: Compression Therapy Compression Therapy N/A Procedures Performed: Debridement Debridement Treatment Notes  Electronic Signature(s) Signed: 08/11/2022 1:23:56 PM By: Fredirick Maudlin MD FACS Entered By: Fredirick Maudlin on 08/11/2022 13:23:56 -------------------------------------------------------------------------------- Multi-Disciplinary Care Plan Details Patient Name: Date of Service: [name], BLOOM 08/11/2022 12:30  PM Medical Record Number: [id number] Patient Account Number: [id number] Date of Birth/Sex: Treating RN: [name] (72 y.o. [name] Primary Care Pearson Reasons: PA [name], [place] Other Clinician: Referring Bellami Farrelly: Treating Whitley Patchen/Extender: [name] in Treatment: Portal reviewed with physician Active Inactive Venous Leg Ulcer Nursing Diagnoses: Knowledge deficit related to disease process and management Potential for venous Insuffiency (use before diagnosis confirmed) VIRGEL, HARO ([id number]) [id number] Page 5 of 12 Goals: Patient will maintain optimal edema control Date Initiated: 02/17/2022 Target Resolution Date: 09/01/2022 Goal Status: Active Interventions: Assess peripheral edema status every visit. Compression as ordered Provide education on venous insufficiency Treatment Activities: Non-invasive vascular studies : 02/17/2022 Therapeutic compression applied : 02/17/2022 Notes: Wound/Skin Impairment Nursing Diagnoses: Impaired tissue integrity Knowledge deficit related to ulceration/compromised skin integrity Goals: Patient/caregiver will verbalize understanding of skin care regimen Date Initiated: 02/17/2022 Target Resolution Date: 09/01/2022 Goal Status: Active Ulcer/skin breakdown will have a volume reduction of 30% by week 4 Date Initiated: 02/17/2022 Date Inactivated: 03/10/2022 Target Resolution Date: 03/10/2022 Unmet Reason: infection being treated, Goal Status: Unmet waiting on derm appte Ulcer/skin breakdown will have a volume reduction of 50% by week 8 Date Initiated: 03/10/2022 Date Inactivated: 04/14/2022 Target Resolution Date: 04/07/2022 Goal Status: Unmet Unmet Reason: calcinosis Ulcer/skin breakdown will have a volume reduction of 80% by week 12 Date Initiated: 04/14/2022 Date Inactivated: 05/05/2022 Target Resolution Date: 05/05/2022 Unmet Reason: infection, chronic Goal  Status: Unmet condition Interventions: Assess patient/caregiver ability to obtain necessary supplies Assess patient/caregiver ability to perform ulcer/skin care regimen upon admission and as needed Assess ulceration(s) every visit Provide education on ulcer and skin care Treatment Activities: Skin care regimen initiated : 02/17/2022 Topical wound management initiated : 02/17/2022 Notes: Electronic Signature(s) Signed: 08/11/2022 5:34:06 PM By: Baruch Gouty RN, BSN Entered By: Baruch Gouty on 08/11/2022 13:05:08 -------------------------------------------------------------------------------- Pain Assessment Details Patient Name: Date of Service: KRYSTAL, DELDUCA 08/11/2022 12:30 PM Medical Record Number: [id number] Patient Account Number: [id number] Date of Birth/Sex: Treating RN: [name] (72 y.o. [name] Primary Care Nishita Isaacks: PA [name], [place] Other Clinician: Referring Aadam Zhen: Treating Anijah Spohr/Extender: [name] in Treatment: 26 Active Problems Location of Pain Severity and Description of Pain Patient Has [name], [name] ([id number]) [id number] Page 6 of 12 Patient Has Paino Yes Site Locations Pain Location: Pain in Ulcers With Dressing Change: Yes Duration of the Pain. Constant / Intermittento Intermittent Rate the pain. Current Pain Level: 0 Worst Pain Level: 3 Character of Pain Describe the Pain: Aching, Throbbing Pain Management and Medication Current Pain Management: Medication: Yes Is the Current Pain Management Adequate: Adequate How does your wound impact your activities of daily livingo Sleep: [name] Bathing: [name] Appetite: [name] Relationship With Others: [name] Bladder Continence: [name] Emotions: [name] Bowel Continence: [name] Work: [name] Toileting: [name] Drive: [name] Dressing: [name] Hobbies: [name] Electronic Signature(s) Signed: 08/11/2022 5:34:06 PM By: Baruch Gouty RN, BSN Entered By: Baruch Gouty on  08/11/2022 12:41:39 -------------------------------------------------------------------------------- Patient/Caregiver Education Details Patient Name: Date of Service: JACEON, HEIBERGER 1/17/2024andnbsp12:30 PM Medical Record Number: [id number] Patient Account Number: [id number] Date of Birth/Gender: Treating RN: [birth year] (72 y.o. [name] Primary Care Physician: PA [name], [place] Other Clinician: Referring Physician: Treating Physician/Extender: [name] in Treatment: 26 Education Assessment Education Provided To: Patient Education Topics Provided  Infection: Methods: Explain/Verbal Responses: Reinforcements needed, State content correctly Venous: Methods: Explain/Verbal Responses: Reinforcements needed, State content correctly Electronic Signature(s) [name], [name] ([id number]) [id number] Page 7 of 12 Signed: 08/11/2022 5:34:06 PM By: Baruch Gouty RN, BSN Entered By: Baruch Gouty on 08/11/2022 13:05:34 -------------------------------------------------------------------------------- Wound Assessment Details Patient Name: Date of Service: [name], [name] 08/11/2022 12:30 PM Medical Record Number: [id number] Patient Account Number: [id number] Date of Birth/Sex: Treating RN: [birth year] (72 y.o. [name] Primary Care Kenshawn Maciolek: PA [name], [place] Other Clinician: Referring Addis Bennie: Treating Cove Haydon/Extender: [name] in Treatment: 26 Wound Status Wound Number: 1 Primary Lymphedema Etiology: Wound Location: Left, Dorsal Foot Wound Open Wounding Event: Gradually Appeared Status: Date Acquired: 12/07/2021 Comorbid Cataracts, Anemia, Lymphedema, Congestive Heart Failure, Deep [name] Of Treatment: 26 History: Vein Thrombosis, Hypertension, Peripheral Venous Disease Clustered Wound: [name] Photos Wound Measurements Length: (cm) 1.2 Width: (cm) 1.2 Depth: (cm) 0.1 Area: (cm) 1.131 Volume:  (cm) 0.113 % Reduction in Area: 70.2% % Reduction in Volume: 92.6% Epithelialization: Small (1-33%) Tunneling: [name] Undermining: [name] Wound Description Classification: Full Thickness Without Exposed Suppor Wound Margin: Flat and Intact Exudate Amount: Medium Exudate Type: Serous Exudate Color: amber t Structures Foul Odor After Cleansing: [name] Slough/Fibrino Yes Wound Bed Granulation Amount: Medium (34-66%) Exposed Structure Granulation Quality: Red, Pink Fascia Exposed: [name] Necrotic Amount: Medium (34-66%) Fat Layer (Subcutaneous Tissue) Exposed: Yes Necrotic Quality: Adherent Slough Tendon Exposed: [name] Muscle Exposed: [name] Joint Exposed: [name] Bone Exposed: [name] Periwound Skin Texture Texture Color [name] Abnormalities Noted: Yes [name] Abnormalities Noted: [name] Erythema: Yes Moisture Erythema Location: Red Streaks [name] Abnormalities Noted: [name] Hemosiderin Staining: [name] Dry / Scaly: [name] Rubor: [name] Maceration: [name] Temperature / Pain Temperature: [name] Abnormality Nordling, Yasir ([id number]) [id number] Page 8 of 12 Treatment Notes Wound #1 (Foot) Wound Laterality: Dorsal, Left Cleanser Peri-Wound Care Triamcinolone 15 (g) Discharge Instruction: Use triamcinolone 15 (g) as directed Sween Lotion (Moisturizing lotion) Discharge Instruction: Apply moisturizing lotion as directed Topical Primary Dressing Promogran Prisma Matrix, 4.34 (sq in) (silver collagen) Discharge Instruction: Moisten collagen with saline or hydrogel Secondary Dressing ABD Pad, 5x9 Discharge Instruction: Apply over primary dressing as directed. Woven Gauze Sponge, Non-Sterile 4x4 in Discharge Instruction: Apply over primary dressing as directed. Secured With Compression Wrap ThreePress (3 layer compression wrap) Discharge Instruction: Apply three layer compression as directed. Compression Stockings Add-Ons Electronic Signature(s) Signed: 08/11/2022 5:34:06 PM By: Baruch Gouty RN, BSN Entered  By: Baruch Gouty on 08/11/2022 13:00:30 -------------------------------------------------------------------------------- Wound Assessment Details Patient Name: Date of Service: [name], [name] 08/11/2022 12:30 PM Medical Record Number: [id number] Patient Account Number: [id number] Date of Birth/Sex: Treating RN: [birth year][name] (72 y.o. [name] Primary Care Jeri Jeanbaptiste: PA [name], [place] Other Clinician: Referring Jeanne Diefendorf: Treating Layten Aiken/Extender: [name] in Treatment: 26 Wound Status Wound Number: 3 Primary Lymphedema Etiology: Wound Location: Right, Medial Lower Leg Wound Open Wounding Event: Gradually Appeared Status: Date Acquired: 12/07/2021 Comorbid Cataracts, Anemia, Lymphedema, Congestive Heart Failure, Deep [name] Of Treatment: 26 History: Vein Thrombosis, Hypertension, Peripheral Venous Disease Clustered Wound: [name] Photos [name], [name] ([id number]) [id number] Page 9 of 12 Wound Measurements Length: (cm) 3 Width: (cm) 5 Depth: (cm) 0.8 Area: (cm) 11.781 Volume: (cm) 9.425 % Reduction in Area: -120.6% % Reduction in Volume: -341.2% Epithelialization: None Tunneling: [name] Undermining: [name] Wound Description Classification: Full Thickness With Exposed Suppo Wound Margin: Distinct, outline attached Exudate Amount: Large Exudate Type: Serosanguineous Exudate Color: red, brown rt Structures Foul Odor After Cleansing: [name] Slough/Fibrino Yes Wound Bed Granulation Amount: Small (1-33%) Exposed  Structure Granulation Quality: Pink Fascia Exposed: [name] Necrotic Amount: Large (67-100%) Fat Layer (Subcutaneous Tissue) Exposed: Yes Necrotic Quality: Adherent Slough Tendon Exposed: [name] Muscle Exposed: [name] Joint Exposed: [name] Bone Exposed: [name] Periwound Skin Texture Texture Color [name] Abnormalities Noted: [name] [name] Abnormalities Noted: [name] Excoriation: Yes Erythema: [name] Hemosiderin Staining: Yes Moisture Rubor: Yes [name]  Abnormalities Noted: [name] Dry / Scaly: [name] Temperature / Pain Maceration: [name] Temperature: [name] Abnormality Tenderness on Palpation: Yes Treatment Notes Wound #3 (Lower Leg) Wound Laterality: Right, Medial Cleanser Peri-Wound Care Triamcinolone 15 (g) Discharge Instruction: Use triamcinolone 15 (g) as directed Zinc Oxide Ointment 30g tube Discharge Instruction: Apply Zinc Oxide to periwound with each dressing change Sween Lotion (Moisturizing lotion) Discharge Instruction: Apply moisturizing lotion as directed Topical Primary Dressing Santyl Ointment Discharge Instruction: Apply nickel thick amount to wound bed as instructed sodium thiosulfate ointment Discharge Instruction: 1:1 mix with santyl to wound bed Secondary Dressing ABD Pad, 8x10 Discharge Instruction: Apply over primary dressing as directed. Woven Gauze Sponge, Non-Sterile 4x4 in Discharge Instruction: Apply over primary dressing as directed. [name], [name] ([id number]) [id number] Page 10 of 12 Zetuvit Plus 4x8 in Discharge Instruction: Apply over primary dressing as directed. Secured With Compression Wrap ThreePress (3 layer compression wrap) Discharge Instruction: Apply three layer compression as directed. Compression Stockings Add-Ons Electronic Signature(s) Signed: 08/11/2022 5:34:06 PM By: Baruch Gouty RN, BSN Entered By: Baruch Gouty on 08/11/2022 13:01:39 -------------------------------------------------------------------------------- Wound Assessment Details Patient Name: Date of Service: OLANDER, FRIEDL 08/11/2022 12:30 PM Medical Record Number: [id number] Patient Account Number: [id number] Date of Birth/Sex: Treating RN: [name] (72 y.o. [name] Primary Care Brandin Stetzer: PA [name], [place] Other Clinician: Referring [name] [name]: Treating Henrik Orihuela/Extender: [name] in Treatment: 26 Wound Status Wound Number: 6 Primary Calcinosis Etiology: Wound  Location: Right, Anterior Lower Leg Wound Open Wounding Event: Gradually Appeared Status: Date Acquired: 05/12/2022 Comorbid Cataracts, Anemia, Lymphedema, Congestive Heart Failure, Deep [name] Of Treatment: 13 History: Vein Thrombosis, Hypertension, Peripheral Venous Disease Clustered Wound: [name] Photos Wound Measurements Length: (cm) 0.3 Width: (cm) 0.2 Depth: (cm) 0.3 Area: (cm) 0.047 Volume: (cm) 0.014 % Reduction in Area: 80.1% % Reduction in Volume: 41.7% Epithelialization: None Tunneling: [name] Undermining: [name] Wound Description Classification: Full Thickness Without Exposed Suppor Wound Margin: Epibole Exudate Amount: Medium Exudate Type: Purulent Exudate Color: yellow, brown, green t Structures Foul Odor After Cleansing: [name] Slough/Fibrino Yes Wound Bed Granulation Amount: Large (67-100%) Exposed Structure Granulation Quality: Red Fascia Exposed: [name] Necrotic Amount: None Present (0%) Fat Layer (Subcutaneous Tissue) Exposed: Yes [name], [name] ([id number]) [id number] Page 11 of 12 Tendon Exposed: [name] Muscle Exposed: [name] Joint Exposed: [name] Bone Exposed: [name] Periwound Skin Texture Texture Color [name] Abnormalities Noted: Yes [name] Abnormalities Noted: [name] Atrophie Blanche: [name] Moisture Cyanosis: [name] [name] Abnormalities Noted: Yes Ecchymosis: [name] Erythema: [name] Hemosiderin Staining: Yes Mottled: [name] Pallor: [name] Rubor: [name] Temperature / Pain Temperature: [name] Abnormality Treatment Notes Wound #6 (Lower Leg) Wound Laterality: Right, Anterior Cleanser Peri-Wound Care Sween Lotion (Moisturizing lotion) Discharge Instruction: Apply moisturizing lotion as directed Topical Primary Dressing Promogran Prisma Matrix, 4.34 (sq in) (silver collagen) Discharge Instruction: Moisten collagen with saline or hydrogel Secondary Dressing ABD Pad, 8x10 Discharge Instruction: Apply over primary dressing as directed. Woven Gauze Sponge, Non-Sterile 4x4 in Discharge  Instruction: Apply over primary dressing as directed. Zetuvit Plus 4x8 in Discharge Instruction: Apply over primary dressing as directed. Secured With Compression Wrap ThreePress (3 layer compression wrap) Discharge Instruction: Apply three layer compression as directed. Compression Stockings Environmental education officer) Signed:  08/11/2022 5:34:06 PM By: Baruch Gouty RN, BSN Entered By: Baruch Gouty on 08/11/2022 13:02:21 -------------------------------------------------------------------------------- Vitals Details Patient Name: Date of Service: JAECOB, LOWDEN [name] 08/11/2022 12:30 PM Medical Record Number: [id number] Patient Account Number: [id number] Date of Birth/Sex: Treating RN: [name] (72 y.o. [name] Primary Care Nuriyah Hanline: PA [name], [place] Other Clinician: Referring Luverne Zerkle: Treating Rasheem Figiel/Extender: [name] in Treatment: [address] [place], [place] ([id number]) [id number] Page 12 of 12 Time Taken: 12:40 Temperature (F): 97.8 Weight (lbs): 267 Pulse (bpm): 62 Respiratory Rate (breaths/min): 18 Blood Pressure (mmHg): 163/79 Reference Range: 80 - 120 mg / dl Electronic Signature(s) Signed: 08/11/2022 5:34:06 PM By: Baruch Gouty RN, BSN Entered By: Baruch Gouty on 08/11/2022 12:40:33

## 2022-08-11 NOTE — Progress Notes (Signed)
[name] ([id number]) [id number] Page 1 of 14 Visit Report for 08/11/2022 Chief Complaint Document Details Patient Name: Date of Service: [name], [name] 08/11/2022 12:30 PM Medical Record Number: [id number] Patient Account Number: [id number] Date of Birth/Sex: Treating RN: [birth year] (72 y.o. M) Primary Care Provider: PA [name], NO Other Clinician: Referring Provider: Treating Provider/Extender: [name] in Treatment: 26 Information Obtained from: Patient Chief Complaint Patient seen for complaints of Non-Healing Wounds. Electronic Signature(s) Signed: 08/11/2022 1:24:04 PM By: [name] Maudlin MD FACS Entered By: [name] on 08/11/2022 13:24:04 -------------------------------------------------------------------------------- Debridement Details Patient Name: Date of Service: [name], [name] 08/11/2022 12:30 PM Medical Record Number: [id number] Patient Account Number: [id number] Date of Birth/Sex: Treating RN: 12-02-1950 (72 y.o. [name] Primary Care Provider: PA [name], [place] Other Clinician: Referring Provider: Treating Provider/Extender: [name] in Treatment: 26 Debridement Performed for Assessment: Wound #1 Left,Dorsal Foot Performed By: Physician [name] Maudlin, MD Debridement Type: Debridement Level of Consciousness (Pre-procedure): Awake and Alert Pre-procedure Verification/Time Out Yes - 13:10 Taken: Start Time: 13:12 Pain Control: Lidocaine 5% topical ointment T Area Debrided (L x W): otal 1.2 (cm) x 1.2 (cm) = 1.44 (cm) Tissue and other material debrided: Viable, Non-Viable, Slough, Subcutaneous, Slough Level: Skin/Subcutaneous Tissue Debridement Description: Excisional Instrument: Curette Bleeding: Minimum Hemostasis Achieved: Pressure Procedural Pain: 1 Post Procedural Pain: 0 Response to Treatment: Procedure was tolerated well Level of Consciousness (Post- Awake  and Alert procedure): Post Debridement Measurements of Total Wound Length: (cm) 1.2 Width: (cm) 1.2 Depth: (cm) 0.1 Volume: (cm) 0.113 Character of Wound/Ulcer Post Debridement: Improved Post Procedure Diagnosis Same as [name], [name] ([id number]) [id number] Page 2 of 14 Notes Scribed for Dr. Celine [name] by [name] Gouty, RN Electronic Signature(s) Signed: 08/11/2022 4:33:07 PM By: [name] Maudlin MD FACS Signed: 08/11/2022 5:34:06 PM By: [name] Gouty RN, BSN Entered By: [name] on 08/11/2022 13:15:14 -------------------------------------------------------------------------------- Debridement Details Patient Name: Date of Service: [name], [name] 08/11/2022 12:30 PM Medical Record Number: [id number] Patient Account Number: [id number] Date of Birth/Sex: Treating RN: 1951/07/15 (72 y.o. [name] Primary Care Provider: PA [name], [place] Other Clinician: Referring Provider: Treating Provider/Extender: [name] in Treatment: 26 Debridement Performed for Assessment: Wound #3 Right,Medial Lower Leg Performed By: Physician [name] Maudlin, MD Debridement Type: Debridement Level of Consciousness (Pre-procedure): Awake and Alert Pre-procedure Verification/Time Out Yes - 13:10 Taken: Start Time: 13:12 Pain Control: Lidocaine 5% topical ointment T Area Debrided (L x W): otal 3 (cm) x 5 (cm) = 15 (cm) Tissue and other material debrided: Viable, Non-Viable, Slough, Subcutaneous, Slough, Other: calcium deposits Level: Skin/Subcutaneous Tissue Debridement Description: Excisional Instrument: Curette Bleeding: Minimum Hemostasis Achieved: Pressure Procedural Pain: 4 Post Procedural Pain: 1 Response to Treatment: Procedure was tolerated well Level of Consciousness (Post- Awake and Alert procedure): Post Debridement Measurements of Total Wound Length: (cm) 3 Width: (cm) 5 Depth: (cm) 0.8 Volume: (cm)  9.425 Character of Wound/Ulcer Post Debridement: Improved Post Procedure Diagnosis Same as Pre-procedure Notes Scribed for Dr. Celine [name] by [name] Gouty, RN Electronic Signature(s) Signed: 08/11/2022 4:33:07 PM By: [name] Maudlin MD FACS Signed: 08/11/2022 5:34:06 PM By: [name] Gouty RN, BSN Entered By: [name] on 08/11/2022 13:17:00 HPI Details -------------------------------------------------------------------------------- [name] ([id number]) [id number] Page 3 of 14 Patient Name: Date of Service: [name], [name] 08/11/2022 12:30 PM Medical Record Number: [id number] Patient Account Number: [id number] Date of Birth/Sex: Treating RN: 1951/03/03 (72 y.o. M) Primary Care Provider: PA [name], NO Other Clinician: Referring Provider: Treating Provider/Extender: [name]  [name] Weeks in Treatment: 26 History of Present Illness HPI Description: ADMISSION 02/09/2022 This is a 72 year old man who recently moved to [place] from [place]. He has a history of of coronary artery disease and prior left popliteal vein DVT . He is not diabetic and does not smoke. 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 He has had multiple spinal surgeries and has limited mobility. He primarily uses a motorized wheelchair. He has had lymphedema for many years and does have lymphedema pumps although he says he does not use them frequently. He has wounds on his bilateral lower extremities. He was receiving care in [place] for these prior to his move. They were apparently packing his wounds with Betadine soaked gauze. He has worn compression wraps in the past but not recently. He did say that they helped tremendously. In clinic today, his right ABI is noncompressible but has good signals; the left ABI was 1.17. No formal vascular studies have been performed. On his left dorsal foot, there is a circular lesion that exposes the fat layer. There is  fairly heavy slough accumulation within the wound, but no significant odor. On his right medial calf, he has multiple pinholes that have slough buildup in them and probe for about 2 to 4 mm in depth. They are draining clear fluid. On his right lateral midfoot, he has ulceration consistent with venous stasis. It is geographic and has slough accumulation. 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 02/17/2022: All of the wounds are little bit larger today and have more slough accumulation. Today, the medial calf openings are larger and probe to something that feels calcified. There is odor coming from his wounds. His vascular studies are scheduled for August 9 and 10. 02-24-2022 upon evaluation today patient presents for follow-up concerning his ongoing issues here with his lower extremities. With that being said he does have significant problems with what appears to be cellulitis unfortunately the PCR culture that was sent last week got crushed by UPS in transit. With that being said we are going to reobtain a culture today although I am actually to do it on the right medial leg as this seems to be the most inflamed area today where there is some questionable drainage as well. I am going to remove some of the calcium deposits which are loosening obtain a deeper culture from this area. Fortunately I do not see any evidence of active infection systemically which is good news but locally I think we are having a bigger issue here. He does have his appointment next Thursday with vascular. Patient has also been referred to [facility] for further evaluation and treatment of the calcinosis for possible sulfate injections. 03/04/2022: The culture that was performed last week grew out multiple species including Klebsiella, Morganella, and Pseudomonas aeruginosa. He had been prescribed Bactrim but this was not adequate to cover  all of the species and levofloxacin was recommended. He completed the Bactrim but did not pick up the levofloxacin and begin taking it until yesterday. 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 He had arterial studies performed today. He does have evidence of peripheral arterial disease. Results copied here: +-------+-----------+-----------+------------+------------+ ABI/TBIT oday's ABIT oday's TBIPrevious ABIPrevious TBI +-------+-----------+-----------+------------+------------+ Right [place] 0.75    +-------+-----------+-----------+------------+------------+ Left  [place] 0.57    +-------+-----------+-----------+------------+------------+ Arterial wall calcification precludes accurate ankle pressures and ABIs. Summary: Right: Resting right ankle-brachial index indicates noncompressible right lower extremity arteries. The right toe-brachial index is normal. Left: Resting left ankle-brachial index indicates noncompressible left lower extremity arteries. The left toe-brachial index is abnormal. He continues to have further tissue breakdown at the right medial calf and there is ample slough accumulation along with necrotic subcutaneous tissue. The left dorsal foot wound has built up slough, as well. The right medial foot wound is nearly closed with just a light layer of eschar over the surface. The right dorsal lateral foot wound has also accumulated a fair amount of slough and remains quite tender. 03/10/2022: He has been taking the prescribed levofloxacin and has about 3 days left of this. The erythema and induration on his right leg has improved. He continues to experience further breakdown of the right medial calf wound. There is extensive slough and debris accumulation there. There is heavy slough on his left dorsal  foot wound, but no odor or purulent drainage. The right medial foot wound appears to be closed. The right dorsal lateral foot wound also has a fair amount of slough but it is less tender than at our last visit. He still does not have an appointment with dermatology. 03/22/2022: The right anterior tibial wound has closed. The right lateral foot wound is nearly closed with just a little bit of slough. The left dorsal foot wound is smaller and continues to have some slough buildup but less so than at prior visits. There is good granulation tissue underlying the slough. Unfortunately the right medial calf wound continues to deteriorate. It has a foul odor today and a lot of necrotic tissue, the surrounding skin is also erythematous and moist. 03/30/2022: The right lateral foot wound continues to contract and just has a little bit of slough and eschar present. The left dorsal foot wound is also smaller with slough overlying good granulation tissue. The right medial calf wound actually looks quite a bit better today; there is no odor and there is much less necrotic tissue although some remains present. We have been using topical gentamicin and he has been taking oral Augmentin. 04/07/2022: The patient saw dermatology at [facility] last week. Unfortunately, it appears that they did not read any of the documentation that was provided. 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 Overall, it was a highly unsatisfactory consultation. In the meantime, however, we were able to identify compounding pharmacy who could create a topical sodium thiosulfate ointment. The patient has that with him today. Both of the right foot wounds are now closed. The left  dorsal foot wound has contracted but still has a layer of slough on the surface. The medial calf wounds on the right all look a little bit better. They still have heavy slough along with the chunks of calcium. Everything is stained purple. 04/14/2022: The wound on his dorsal left foot is a little bit smaller again today. There is still slough accumulation on the surface. The medial calf wounds have quite a bit less slough accumulation today. His right leg, however, is warm and erythematous, concerning for cellulitis. [name], [name] ([id number]) [id number] Page 4 of 14 04/14/2022: He completed his course  of Augmentin yesterday. The medial calf wound sites are much cleaner today and shallower. There is less fragmented calcium in the wounds. He has a lot of lymphedema fluid-like drainage from these wounds, but no purulent discharge or malodor. The wound on his dorsal left foot has also contracted and is shallower. There is a layer of slough over healthy granulation tissue. 05/05/2022: The left dorsal foot wound is smaller today with less slough and more granulation tissue. The right medial calf wounds are shallower, but have extensive slough and some fat necrosis. The drainage has diminished considerably, however. 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 05/12/2022: The left dorsal foot wound continues to contract and fill with granulation tissue. There is slough on the wound surface. The right medial calf wounds also continue to fill with healthy tissue, but there is remaining slough and fat necrosis present. He had a significant increase in his drainage this week and it was a bright yellow-green color. He also had a new wound open over his right anterior tibial surface associated with the spike of cutaneous calcium. The leg is more red, as well, concerning for  infection. 05/19/2022: His culture returned positive for Pseudomonas. He is currently taking a course of oral levofloxacin. We have also been using topical gentamicin mixed in with his sodium thiosulfate. All of the wounds look better this week. 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 There is slough and nonviable subcutaneous tissue still present. The new anterior tibial wound just has a light layer of slough. The dorsal foot wound also has some slough present and appears a little dry today. 05/26/2022: The right medial leg wound continues to improve. It is feeling with granulation tissue, but still accumulates a fairly thick layer of slough on the surface. The more recent anterior tibial wound has remained quite small, but also has some slough on the surface. The left dorsal foot wound has contracted somewhat and has perimeter epithelialization. He completed his oral levofloxacin. 06/02/2022: The left dorsal foot wound continues to improve significantly. There is just a little slough on the wound surface. The right medial leg wound had black drainage, but it looks like silver alginate was applied last week and I theorized that silver sulfide was created with a combination of the silver alginate and the sodium thiosulfate. It did, however, have a bit of an odor to it and extensive slough accumulation. The small anterior tibial wound also had a bit of slough and nonviable subcutaneous tissue present. The skin of his leg was rather macerated, as well. 06/09/2022: The left dorsal foot wound is smaller again this week with just a light layer of slough on the surface. The right medial leg wound looks substantially better. The leg and periwound skin are no longer red and macerated. There is good granulation tissue forming with less slough and nonviable tissue. The anterior tibial wound just has a small amount of slough accumulation. 06/16/2022: The left dorsal foot wound  continues to contract. His wrap slipped a little bit, however, and his foot is more swollen. The right medial leg wound continues to improve. The underlying tissue is more vital-appearing and there is less slough. He does have substantial drainage. The small anterior tibial wound has a bit of slough accumulation. 06/23/2022: The left dorsal foot wound is about half the size as it was last week. There is  just some light slough on the surface and some eschar buildup around the edges. The right anterior leg wound is small with a little slough on the surface. Unfortunately, the right medial leg wound deteriorated fairly substantially. It is malodorous with gray/green/black discoloration. 06/30/2022: The left dorsal foot wound continues to contract. It is nearly closed with just a light layer of slough present. The right anterior leg wound is about the same size and has a small amount of slough on the surface. The right medial leg wound looks a lot better than it did last week. The odor has abated and the tissue is less pulpy and necrotic-looking. There is still a fairly thick layer of slough present. 12/13; the left dorsal foot wound looks very healthy. There was no need to change the dressing here and no debridement. He has a small area on the right anterior lower leg apparently had not changed much in size. The area on the medial leg is the most problematic area this is large with some depth and a completely nonviable surface today. 07/14/2022: The left dorsal foot wound is smaller today with just a little slough and eschar present. The right anterior lower leg wound is about the same without any significant change. The medial leg wound has a black and green surface and is malodorous. No purulent drainage and the periwound skin is intact. Edema control is suboptimal. 07/21/2022: The dorsal foot wound continues to contract and is nearly closed with just a little bit of slough and eschar on the surface.  The right anterior lower leg wound is shallower today but otherwise the dimensions are unchanged. The medial leg wound looks significantly better although it still has a nonviable surface; the odor and black/green surface has resolved. His culture came back with multiple species sensitive to Augmentin, which was prescribed and a very low level of Pseudomonas, which should be handled by the topical gentamicin we have been using. 07/28/2022: The dorsal foot wound is down to just a very tiny opening with a little eschar on the surface. The right anterior tibial wound is about the same with some slough buildup. The right medial leg wound looks quite a bit better this week. There is thick rubbery slough on the surface, but the odor and drainage has improved significantly. 08/04/2022: The dorsal foot wound is almost healed, but the periwound skin has broken down. This appears to be secondary to moisture and adhesive from the absorbent pad. The right anterior tibial wound is shallower. The right medial leg wound continues to improve. It is smaller, still with rubbery slough on the surface. No malodor or purulent drainage. 08/11/2022: The dorsal foot wound is about the same size. It is a little bit dry with some slough accumulation. The right anterior tibial wound is also unchanged. The right medial leg wound is filling in further with good granulation tissue. Still with some slough buildup on the surface. Electronic Signature(s) Signed: 08/11/2022 1:25:27 PM By: [name] Maudlin MD FACS Entered By: [name] on 08/11/2022 13:25:27 -------------------------------------------------------------------------------- Physical Exam Details Patient Name: Date of Service: [name], [name] 08/11/2022 12:30 PM Medical Record Number: [id number] Patient Account Number: [id number] Date of Birth/Sex: Treating RN: 1951/02/20 (72 y.o. M) Primary Care Provider: PA Darnelle Spangle Other Clinician: Referring Provider: Treating  Provider/Extender: [name] in Treatment: [address], [place] ([id number]) [id number] Page 5 of 14 Constitutional Hypertensive, asymptomatic. . . . no acute distress. Respiratory Normal work of breathing on room air. Notes 08/11/2022: The dorsal  foot wound is about the same size. It is a little bit dry with some slough accumulation. The right anterior tibial wound is also unchanged. The right medial leg wound is filling in further with good granulation tissue. Still with some slough buildup on the surface. Electronic Signature(s) Signed: 08/11/2022 1:27:15 PM By: [name] Maudlin MD FACS Entered By: [name] on 08/11/2022 13:27:15 -------------------------------------------------------------------------------- Physician Orders Details Patient Name: Date of Service: [name], [name] 08/11/2022 12:30 PM Medical Record Number: [id number] Patient Account Number: [id number] Date of Birth/Sex: Treating RN: 10-Sep-1950 (72 y.o. [name] Primary Care Provider: PA [name], [place] Other Clinician: Referring Provider: Treating Provider/Extender: [name] in Treatment: 26 Verbal / Phone Orders: No Diagnosis Coding ICD-10 Coding Code Description [phone] 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d L97.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) I10 Essential (primary) hypertension I25.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 - Dr. Celine [name] Rm 1 Return A Wednesday 1/24 @ 1230 pm Anesthetic Wound #1 Left,Dorsal Foot (In clinic) Topical Lidocaine 5% applied to wound  bed Wound #3 Right,Medial Lower Leg (In clinic) Topical Lidocaine 5% applied to wound bed (In clinic) Topical Lidocaine 4% applied to wound bed Wound #6 Right,Anterior Lower Leg (In clinic) Topical Lidocaine 5% applied to wound bed (In clinic) Topical Lidocaine 4% applied to wound bed Bathing/ Shower/ Hygiene May shower with protection but do not get wound dressing(s) wet. Protect dressing(s) with water repellant cover (for example, large plastic bag) or a cast cover and may then take shower. Edema Control - Lymphedema / SCD / Other Lymphedema Pumps. Use Lymphedema pumps on leg(s) 2-3 times a day for 45-60 minutes. If wearing any wraps or hose, do not remove them. Continue exercising as instructed. - use 1-2 times per day over wraps Elevate legs to the level of the heart or above for 30 minutes daily and/or when sitting for 3-4 times a day throughout the day. - throughout the day Exercise regularly Wound Treatment [name], [name] ([id number]) [id number] Page 6 of 14 Wound #1 - Foot Wound Laterality: Dorsal, Left Peri-Wound Care: Triamcinolone 15 (g) 1 x Per Week/30 Days Discharge Instructions: Use triamcinolone 15 (g) as directed Peri-Wound Care: Sween Lotion (Moisturizing lotion) 1 x Per Week/30 Days Discharge Instructions: Apply moisturizing lotion as directed Prim Dressing: Promogran Prisma Matrix, 4.34 (sq in) (silver collagen) 1 x Per Week/30 Days ary Discharge Instructions: Moisten collagen with saline or hydrogel Secondary Dressing: ABD Pad, 5x9 1 x Per Week/30 Days Discharge Instructions: Apply over primary dressing as directed. Secondary Dressing: Woven Gauze Sponge, Non-Sterile 4x4 in 1 x Per Week/30 Days Discharge Instructions: Apply over primary dressing as directed. Compression Wrap: ThreePress (3 layer compression wrap) 1 x Per Week/30 Days Discharge Instructions: Apply three layer compression as directed. Wound #3 - Lower Leg Wound Laterality:  Right, Medial Peri-Wound Care: Triamcinolone 15 (g) 1 x Per Week/30 Days Discharge Instructions: Use triamcinolone 15 (g) as directed Peri-Wound Care: Zinc Oxide Ointment 30g tube 1 x Per Week/30 Days Discharge Instructions: Apply Zinc Oxide to periwound with each dressing change Peri-Wound Care: Sween Lotion (Moisturizing lotion) 1 x Per Week/30 Days Discharge Instructions: Apply moisturizing lotion as directed Prim Dressing: Santyl Ointment 1 x Per Week/30 Days ary Discharge  Instructions: Apply nickel thick amount to wound bed as instructed Prim Dressing: sodium thiosulfate ointment 1 x Per Week/30 Days ary Discharge Instructions: 1:1 mix with santyl to wound bed Secondary Dressing: ABD Pad, 8x10 1 x Per Week/30 Days Discharge Instructions: Apply over primary dressing as directed. Secondary Dressing: Woven Gauze Sponge, Non-Sterile 4x4 in 1 x Per Week/30 Days Discharge Instructions: Apply over primary dressing as directed. Secondary Dressing: Zetuvit Plus 4x8 in 1 x Per Week/30 Days Discharge Instructions: Apply over primary dressing as directed. Compression Wrap: ThreePress (3 layer compression wrap) 1 x Per Week/30 Days Discharge Instructions: Apply three layer compression as directed. Wound #6 - Lower Leg Wound Laterality: Right, Anterior Peri-Wound Care: Sween Lotion (Moisturizing lotion) 1 x Per Week/30 Days Discharge Instructions: Apply moisturizing lotion as directed Prim Dressing: Promogran Prisma Matrix, 4.34 (sq in) (silver collagen) 1 x Per Week/30 Days ary Discharge Instructions: Moisten collagen with saline or hydrogel Secondary Dressing: ABD Pad, 8x10 1 x Per Week/30 Days Discharge Instructions: Apply over primary dressing as directed. Secondary Dressing: Woven Gauze Sponge, Non-Sterile 4x4 in 1 x Per Week/30 Days Discharge Instructions: Apply over primary dressing as directed. Secondary Dressing: Zetuvit Plus 4x8 in 1 x Per Week/30 Days Discharge Instructions: Apply  over primary dressing as directed. Compression Wrap: ThreePress (3 layer compression wrap) 1 x Per Week/30 Days Discharge Instructions: Apply three layer compression as directed. Electronic Signature(s) Signed: 08/11/2022 4:33:07 PM By: [name] Maudlin MD FACS Entered By: [name] on 08/11/2022 13:27:26 [name] ([id number]) [id number] Page 7 of 14 -------------------------------------------------------------------------------- Problem List Details Patient Name: Date of Service: NICOLES, SEDLACEK 08/11/2022 12:30 PM Medical Record Number: [id number] Patient Account Number: [id number] Date of Birth/Sex: Treating RN: 13-Jul-1951 (72 y.o. 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) 02/09/2022 No Yes I10 Essential (primary) hypertension 02/09/2022 No Yes I25.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 02/09/2022 No Yes I89.0 Lymphedema, not elsewhere classified 02/09/2022 No Yes Z86.718 Personal history of other venous thrombosis and embolism 02/09/2022 No Yes Inactive Problems ICD-10 Code Description Active Date Inactive Date L97.512 Non-pressure chronic ulcer of other part of right foot with fat layer exposed 02/17/2022 02/17/2022 Resolved Problems Electronic Signature(s) Signed: 08/11/2022 1:23:39 PM By: [name] Maudlin MD FACS Entered By: [name] on 08/11/2022 13:23:38 [name] ([id number])  [id number] Page 8 of 14 -------------------------------------------------------------------------------- Progress Note Details Patient Name: Date of Service: CHOUA, CHALKER 08/11/2022 12:30 PM Medical Record Number: [id number] Patient Account Number: [id number] Date of Birth/Sex: Treating RN: 02/14/1951 (72 y.o. M) Primary Care Provider: PA [name], NO Other Clinician: Referring Provider: Treating Provider/Extender: [name] in Treatment: 26 Subjective Chief Complaint Information obtained from Patient Patient seen for complaints of Non-Healing Wounds. History of Present Illness (HPI) ADMISSION 02/09/2022 This is a 72 year old man who recently moved to [place] from [place]. He has a history of of coronary artery disease and prior left popliteal vein DVT . He is not diabetic and does not smoke. 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 He has had multiple spinal surgeries and  has limited mobility. He primarily uses a motorized wheelchair. He has had lymphedema for many years and does have lymphedema pumps although he says he does not use them frequently. He has wounds on his bilateral lower extremities. He was receiving care in [place] for these prior to his move. They were apparently packing his wounds with Betadine soaked gauze. He has worn compression wraps in the past but not recently. He did say that they helped tremendously. In clinic today, his right ABI is noncompressible but has good signals; the left ABI was 1.17. No formal vascular studies have been performed. On his left dorsal foot, there is a circular lesion that exposes the fat layer. There is fairly heavy slough accumulation within the wound, but no significant odor. On his right medial calf, he has multiple pinholes that have slough buildup in them and probe for about 2 to 4 mm in depth. They are draining clear fluid. On his right lateral midfoot, he has  ulceration consistent with venous stasis. It is geographic and has slough accumulation. 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 02/17/2022: All of the wounds are little bit larger today and have more slough accumulation. Today, the medial calf openings are larger and probe to something that feels calcified. There is odor coming from his wounds. His vascular studies are scheduled for August 9 and 10. 02-24-2022 upon evaluation today patient presents for follow-up concerning his ongoing issues here with his lower extremities. With that being said he does have significant problems with what appears to be cellulitis unfortunately the PCR culture that was sent last week got crushed by UPS in transit. With that being said we are going to reobtain a culture today although I am actually to do it on the right medial leg as this seems to be the most inflamed area today where there is some questionable drainage as well. I am going to remove some of the calcium deposits which are loosening obtain a deeper culture from this area. Fortunately I do not see any evidence of active infection systemically which is good news but locally I think we are having a bigger issue here. He does have his appointment next Thursday with vascular. Patient has also been referred to [facility] for further evaluation and treatment of the calcinosis for possible sulfate injections. 03/04/2022: The culture that was performed last week grew out multiple species including Klebsiella, Morganella, and Pseudomonas aeruginosa. He had been prescribed Bactrim but this was not adequate to cover all of the species and levofloxacin was recommended. He completed the Bactrim but did not pick up the levofloxacin and begin taking it until yesterday. 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 He had arterial studies performed today. He does have evidence of peripheral arterial disease. Results copied here: +-------+-----------+-----------+------------+------------+ ABI/TBIT oday's ABIT oday's TBIPrevious ABIPrevious TBI +-------+-----------+-----------+------------+------------+ Right [place] 0.75    +-------+-----------+-----------+------------+------------+ Left [place] 0.57    +-------+-----------+-----------+------------+------------+ Arterial wall calcification precludes accurate ankle pressures and ABIs. Summary: Right: Resting right ankle-brachial index indicates noncompressible right lower extremity arteries. The right toe-brachial index is normal. Left: Resting left ankle-brachial index indicates noncompressible left lower extremity arteries. The left toe-brachial index is abnormal. He continues to have further tissue breakdown at the right medial calf and there is ample slough accumulation along  with necrotic subcutaneous tissue. The left dorsal foot wound has built up slough, as well. The right medial foot wound is nearly closed with just a light layer of eschar over the surface. The right dorsal lateral foot wound has also accumulated a fair amount of slough and remains quite tender. 03/10/2022: He has been taking the prescribed levofloxacin and has about 3 days left of this. The erythema and induration on his right leg has improved. He continues to experience further breakdown of the right medial calf wound. There is extensive slough and debris accumulation there. There is heavy slough on his left dorsal foot wound, but no odor or purulent drainage. The right medial foot wound appears to be closed. The right dorsal lateral foot wound also has a fair amount of slough but it is less tender than at our last visit. He still does not have an appointment with  dermatology. 03/22/2022: The right anterior tibial wound has closed. The right lateral foot wound is nearly closed with just a little bit of slough. The left dorsal foot wound is smaller and continues to have some slough buildup but less so than at prior visits. There is good granulation tissue underlying the slough. Unfortunately the right medial calf wound continues to deteriorate. It has a foul odor today and a lot of necrotic tissue, the surrounding skin is also erythematous and moist. [name], STARACE ([id number]) [id number] Page 9 of 14 03/30/2022: The right lateral foot wound continues to contract and just has a little bit of slough and eschar present. The left dorsal foot wound is also smaller with slough overlying good granulation tissue. The right medial calf wound actually looks quite a bit better today; there is no odor and there is much less necrotic tissue although some remains present. We have been using topical gentamicin and he has been taking oral Augmentin. 04/07/2022: The patient saw dermatology at [facility] last week. Unfortunately, it appears that they did not read any of the documentation that was provided. 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 Overall, it was a highly unsatisfactory consultation. In the meantime, however, we were able to identify compounding pharmacy who could create a topical sodium thiosulfate ointment. The patient has that with him today. Both of the right foot wounds are now closed. The left dorsal foot wound has contracted but still has a layer of slough on the surface. The medial calf wounds on the right all look a little bit better. They still have heavy slough  along with the chunks of calcium. Everything is stained purple. 04/14/2022: The wound on his dorsal left foot is a little bit smaller again today. There is still slough accumulation on the surface. The medial calf wounds have quite a bit less slough accumulation today. His right leg, however, is warm and erythematous, concerning for cellulitis. 04/14/2022: He completed his course of Augmentin yesterday. The medial calf wound sites are much cleaner today and shallower. There is less fragmented calcium in the wounds. He has a lot of lymphedema fluid-like drainage from these wounds, but no purulent discharge or malodor. The wound on his dorsal left foot has also contracted and is shallower. There is a layer of slough over healthy granulation tissue. 05/05/2022: The left dorsal foot wound is  smaller today with less slough and more granulation tissue. The right medial calf wounds are shallower, but have extensive slough and some fat necrosis. The drainage has diminished considerably, however. 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 05/12/2022: The left dorsal foot wound continues to contract and fill with granulation tissue. There is slough on the wound surface. The right medial calf wounds also continue to fill with healthy tissue, but there is remaining slough and fat necrosis present. He had a significant increase in his drainage this week and it was a bright yellow-green color. He also had a new wound open over his right anterior tibial surface associated with the spike of cutaneous calcium. The leg is more red, as well, concerning for infection. 05/19/2022: His culture returned positive for Pseudomonas. He is currently taking a course of oral levofloxacin. We have also been using topical gentamicin mixed in with his sodium thiosulfate. All of the wounds look better this week. The ones  associated with his calcinosis cutis have filled in substantially. There is slough and nonviable subcutaneous tissue still present. The new anterior tibial wound just has a light layer of slough. The dorsal foot wound also has some slough present and appears a little dry today. 05/26/2022: The right medial leg wound continues to improve. It is feeling with granulation tissue, but still accumulates a fairly thick layer of slough on the surface. The more recent anterior tibial wound has remained quite small, but also has some slough on the surface. The left dorsal foot wound has contracted somewhat and has perimeter epithelialization. He completed his oral levofloxacin. 06/02/2022: The left dorsal foot wound continues to improve significantly. There is just a little slough on the wound surface. The right medial leg wound had black drainage, but it looks like silver alginate was applied last week and I theorized that silver sulfide was created with a combination of the silver alginate and the sodium thiosulfate. It did, however, have a bit of an odor to it and extensive slough accumulation. The small anterior tibial wound also had a bit of slough and nonviable subcutaneous tissue present. The skin of his leg was rather macerated, as well. 06/09/2022: The left dorsal foot wound is smaller again this week with just a light layer of slough on the surface. The right medial leg wound looks substantially better. The leg and periwound skin are no longer red and macerated. There is good granulation tissue forming with less slough and nonviable tissue. The anterior tibial wound just has a small amount of slough accumulation. 06/16/2022: The left dorsal foot wound continues to contract. His wrap slipped a little bit, however, and his foot is more swollen. The right medial leg wound continues to improve. The underlying tissue is more vital-appearing and there is less slough. He does have substantial drainage. The  small anterior tibial wound has a bit of slough accumulation. 06/23/2022: The left dorsal foot wound is about half the size as it was last week. There is just some light slough on the surface and some eschar buildup around the edges. The right anterior leg wound is small with a little slough on the surface. Unfortunately, the right medial leg wound deteriorated fairly substantially. It is malodorous with gray/green/black discoloration. 06/30/2022: The left dorsal foot wound continues to contract. It is nearly closed with just a light layer of slough present. The right anterior leg wound  is about the same size and has a small amount of slough on the surface. The right medial leg wound looks a lot better than it did last week. The odor has abated and the tissue is less pulpy and necrotic-looking. There is still a fairly thick layer of slough present. 12/13; the left dorsal foot wound looks very healthy. There was no need to change the dressing here and no debridement. He has a small area on the right anterior lower leg apparently had not changed much in size. The area on the medial leg is the most problematic area this is large with some depth and a completely nonviable surface today. 07/14/2022: The left dorsal foot wound is smaller today with just a little slough and eschar present. The right anterior lower leg wound is about the same without any significant change. The medial leg wound has a black and green surface and is malodorous. No purulent drainage and the periwound skin is intact. Edema control is suboptimal. 07/21/2022: The dorsal foot wound continues to contract and is nearly closed with just a little bit of slough and eschar on the surface. The right anterior lower leg wound is shallower today but otherwise the dimensions are unchanged. The medial leg wound looks significantly better although it still has a nonviable surface; the odor and black/green surface has resolved. His culture came  back with multiple species sensitive to Augmentin, which was prescribed and a very low level of Pseudomonas, which should be handled by the topical gentamicin we have been using. 07/28/2022: The dorsal foot wound is down to just a very tiny opening with a little eschar on the surface. The right anterior tibial wound is about the same with some slough buildup. The right medial leg wound looks quite a bit better this week. There is thick rubbery slough on the surface, but the odor and drainage has improved significantly. 08/04/2022: The dorsal foot wound is almost healed, but the periwound skin has broken down. This appears to be secondary to moisture and adhesive from the absorbent pad. The right anterior tibial wound is shallower. The right medial leg wound continues to improve. It is smaller, still with rubbery slough on the surface. No malodor or purulent drainage. 08/11/2022: The dorsal foot wound is about the same size. It is a little bit dry with some slough accumulation. The right anterior tibial wound is also unchanged. The right medial leg wound is filling in further with good granulation tissue. Still with some slough buildup on the surface. Patient History Information obtained from Patient, Chart. Family History Cancer - Mother, Heart Disease - Father,Mother, Hypertension - Father,Siblings, Lung Disease - Siblings, No family history of Diabetes, Hereditary Spherocytosis, Kidney Disease, Seizures, Stroke, Thyroid Problems, Tuberculosis. Social History Former smoker - quit 1991, Marital Status - Married, Alcohol Use - Moderate, Drug Use - No History, Caffeine Use - Daily - coffee. [name], [name] ([id number]) [id number] Page 10 of 14 Medical History Eyes Patient has history of Cataracts - right eye removed Denies history of Glaucoma, Optic Neuritis Ear/Nose/Mouth/Throat Denies history of Chronic sinus problems/congestion, Middle ear  problems Hematologic/Lymphatic Patient has history of Anemia - macrocytic, Lymphedema Cardiovascular Patient has history of Congestive Heart Failure, Deep Vein Thrombosis - left leg, Hypertension, Peripheral Venous Disease Endocrine Denies history of Type I Diabetes, Type II Diabetes Genitourinary Denies history of End Stage Renal Disease Integumentary (Skin) Denies history of History of Burn Oncologic Denies history of Received Chemotherapy, Received Radiation Psychiatric Denies history of Anorexia/bulimia, Confinement Anxiety Hospitalization/Surgery  History - cervical fusion. - lumbar surgery. - coronary stent placement. - lasik eye surgery. - hydrocele excision. Medical A Surgical History Notes nd Constitutional Symptoms (General Health) obesity Ear/Nose/Mouth/Throat hard of hearing Respiratory frozen diaphragm right Cardiovascular hyperlipidemia Musculoskeletal myositis, lumbar DDD, spinal stenosis, cervical facet joint syndrome Objective Constitutional Hypertensive, asymptomatic. no acute distress. Vitals Time Taken: 12:40 PM, Weight: 267 lbs, Temperature: 97.8 F, Pulse: 62 bpm, Respiratory Rate: 18 breaths/min, Blood Pressure: 163/79 mmHg. Respiratory Normal work of breathing on room air. General Notes: 08/11/2022: The dorsal foot wound is about the same size. It is a little bit dry with some slough accumulation. The right anterior tibial wound is also unchanged. The right medial leg wound is filling in further with good granulation tissue. Still with some slough buildup on the surface. Integumentary (Hair, Skin) Wound #1 status is Open. Original cause of wound was Gradually Appeared. The date acquired was: 12/07/2021. The wound has been in treatment 26 weeks. The wound is located on the Left,Dorsal Foot. The wound measures 1.2cm length x 1.2cm width x 0.1cm depth; 1.131cm^2 area and 0.113cm^3 volume. There is Fat Layer (Subcutaneous Tissue) exposed. There is no tunneling  or undermining noted. There is a medium amount of serous drainage noted. The wound margin is flat and intact. There is medium (34-66%) red, pink granulation within the wound bed. There is a medium (34-66%) amount of necrotic tissue within the wound bed including Adherent Slough. The periwound skin appearance had no abnormalities noted for texture. The periwound skin appearance exhibited: Erythema. The periwound skin appearance did not exhibit: Dry/Scaly, Maceration, Hemosiderin Staining, Rubor. The surrounding wound skin color is noted with erythema with red streaks. Periwound temperature was noted as No Abnormality. Wound #3 status is Open. Original cause of wound was Gradually Appeared. The date acquired was: 12/07/2021. The wound has been in treatment 26 weeks. The wound is located on the Right,Medial Lower Leg. The wound measures 3cm length x 5cm width x 0.8cm depth; 11.781cm^2 area and 9.425cm^3 volume. There is Fat Layer (Subcutaneous Tissue) exposed. There is no tunneling or undermining noted. There is a large amount of serosanguineous drainage noted. The wound margin is distinct with the outline attached to the wound base. There is small (1-33%) pink granulation within the wound bed. There is a large (67- 100%) amount of necrotic tissue within the wound bed including Adherent Slough. The periwound skin appearance exhibited: Excoriation, Hemosiderin Staining, Rubor. The periwound skin appearance did not exhibit: Dry/Scaly, Maceration, Erythema. Periwound temperature was noted as No Abnormality. The periwound has tenderness on palpation. Wound #6 status is Open. Original cause of wound was Gradually Appeared. The date acquired was: 05/12/2022. The wound has been in treatment 13 weeks. The wound is located on the Right,Anterior Lower Leg. The wound measures 0.3cm length x 0.2cm width x 0.3cm depth; 0.047cm^2 area and 0.014cm^3 volume. There is Fat Layer (Subcutaneous Tissue) exposed. There is no  tunneling or undermining noted. There is a medium amount of purulent drainage noted. The wound margin is epibole. There is large (67-100%) red granulation within the wound bed. There is no necrotic tissue within the wound bed. The periwound skin appearance had no abnormalities noted for texture. The periwound skin appearance had no abnormalities noted for moisture. The periwound skin appearance exhibited: Hemosiderin Staining. The periwound skin appearance did not exhibit: Atrophie Blanche, Cyanosis, Ecchymosis, Mottled, Pallor, Rubor, Erythema. Periwound temperature was noted as No Abnormality. 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 There was a Excisional Skin/Subcutaneous Tissue Debridement with a total area of 1.44 sq cm performed by [name] Maudlin, MD. With the following instrument(s): Curette to remove Viable and Non-Viable tissue/material. Material removed includes Subcutaneous Tissue and Slough and after achieving pain control using Lidocaine 5% topical ointment. No specimens were taken. A time out was conducted at 13:10, prior to the start of the procedure. A Minimum amount of bleeding was controlled with Pressure. The procedure was tolerated well with a pain level of 1 throughout and a pain level of 0 following the procedure. Post Debridement Measurements: 1.2cm length x 1.2cm width x  0.1cm depth; 0.113cm^3 volume. Character of Wound/Ulcer Post Debridement is improved. Post procedure Diagnosis Wound #1: Same as Pre-Procedure General Notes: Scribed for Dr. Celine [name] by [name] Gouty, RN. Pre-procedure diagnosis of Wound #1 is a Lymphedema located on the Left,Dorsal Foot . There was a Three Layer Compression Therapy Procedure by [name] Gouty, RN. Post procedure Diagnosis Wound #1: Same as Pre-Procedure Wound #3 Pre-procedure diagnosis of Wound #3 is a Lymphedema located on the Right,Medial Lower Leg . There was a Excisional Skin/Subcutaneous Tissue Debridement with a total area of 15 sq cm performed by [name] Maudlin, MD. With the following instrument(s): Curette to remove Viable and Non-Viable tissue/material. Material removed includes Subcutaneous Tissue, Slough, and Other: calcium deposits after achieving pain control using Lidocaine 5% topical ointment. No specimens were taken. A time out was conducted at 13:10, prior to the start of the procedure. A Minimum amount of bleeding was controlled with Pressure. The procedure was tolerated well with a pain level of 4 throughout and a pain level of 1 following the procedure. Post Debridement Measurements: 3cm length x 5cm width x 0.8cm depth; 9.425cm^3 volume. Character of Wound/Ulcer Post Debridement is improved. Post procedure Diagnosis Wound #3: Same as Pre-Procedure General Notes: Scribed for Dr. Celine [name] by [name] Gouty, RN. Pre-procedure diagnosis of Wound #3 is a Lymphedema located on the Right,Medial Lower Leg . There was a Three Layer Compression Therapy Procedure by [name] Gouty, RN. Post procedure Diagnosis Wound #3: Same as Pre-Procedure Plan Follow-up Appointments: Return Appointment in 1 week. - Dr. Celine [name] Rm 1 Wednesday 1/24 @ 1230 pm Anesthetic: Wound #1 Left,Dorsal Foot: (In clinic) Topical Lidocaine 5% applied to wound bed Wound #3 Right,Medial Lower Leg: (In clinic) Topical Lidocaine 5% applied to  wound bed (In clinic) Topical Lidocaine 4% applied to wound bed Wound #6 Right,Anterior Lower Leg: (In clinic) Topical Lidocaine 5% applied to wound bed (In clinic) Topical Lidocaine 4% applied to wound bed Bathing/ Shower/ Hygiene: May shower with protection but do not get wound dressing(s) wet. Protect dressing(s) with water repellant cover (for example, large plastic bag) or a cast cover and may then take shower. Edema Control - Lymphedema / SCD / Other: Lymphedema Pumps. Use Lymphedema pumps on leg(s) 2-3 times a day for 45-60 minutes. If wearing any wraps or hose, do not remove them. Continue exercising as instructed. - use 1-2 times per day over wraps Elevate legs to the level of the heart or above for 30 minutes  daily and/or when sitting for 3-4 times a day throughout the day. - throughout the day Exercise regularly WOUND #1: - Foot Wound Laterality: Dorsal, Left Peri-Wound Care: Triamcinolone 15 (g) 1 x Per Week/30 Days Discharge Instructions: Use triamcinolone 15 (g) as directed Peri-Wound Care: Sween Lotion (Moisturizing lotion) 1 x Per Week/30 Days Discharge Instructions: Apply moisturizing lotion as directed Prim Dressing: Promogran Prisma Matrix, 4.34 (sq in) (silver collagen) 1 x Per Week/30 Days ary Discharge Instructions: Moisten collagen with saline or hydrogel Secondary Dressing: ABD Pad, 5x9 1 x Per Week/30 Days Discharge Instructions: Apply over primary dressing as directed. Secondary Dressing: Woven Gauze Sponge, Non-Sterile 4x4 in 1 x Per Week/30 Days Discharge Instructions: Apply over primary dressing as directed. Com pression Wrap: ThreePress (3 layer compression wrap) 1 x Per Week/30 Days Discharge Instructions: Apply three layer compression as directed. KEYLER, HOGE ([id number]) [id number] Page 12 of 14 WOUND #3: - Lower Leg Wound Laterality: Right, Medial Peri-Wound Care: Triamcinolone 15 (g) 1 x Per Week/30 Days Discharge  Instructions: Use triamcinolone 15 (g) as directed Peri-Wound Care: Zinc Oxide Ointment 30g tube 1 x Per Week/30 Days Discharge Instructions: Apply Zinc Oxide to periwound with each dressing change Peri-Wound Care: Sween Lotion (Moisturizing lotion) 1 x Per Week/30 Days Discharge Instructions: Apply moisturizing lotion as directed Prim Dressing: Santyl Ointment 1 x Per Week/30 Days ary Discharge Instructions: Apply nickel thick amount to wound bed as instructed Prim Dressing: sodium thiosulfate ointment 1 x Per Week/30 Days ary Discharge Instructions: 1:1 mix with santyl to wound bed Secondary Dressing: ABD Pad, 8x10 1 x Per Week/30 Days Discharge Instructions: Apply over primary dressing as directed. Secondary Dressing: Woven Gauze Sponge, Non-Sterile 4x4 in 1 x Per Week/30 Days Discharge Instructions: Apply over primary dressing as directed. Secondary Dressing: Zetuvit Plus 4x8 in 1 x Per Week/30 Days Discharge Instructions: Apply over primary dressing as directed. Com pression Wrap: ThreePress (3 layer compression wrap) 1 x Per Week/30 Days Discharge Instructions: Apply three layer compression as directed. WOUND #6: - Lower Leg Wound Laterality: Right, Anterior Peri-Wound Care: Sween Lotion (Moisturizing lotion) 1 x Per Week/30 Days Discharge Instructions: Apply moisturizing lotion as directed Prim Dressing: Promogran Prisma Matrix, 4.34 (sq in) (silver collagen) 1 x Per Week/30 Days ary Discharge Instructions: Moisten collagen with saline or hydrogel Secondary Dressing: ABD Pad, 8x10 1 x Per Week/30 Days Discharge Instructions: Apply over primary dressing as directed. Secondary Dressing: Woven Gauze Sponge, Non-Sterile 4x4 in 1 x Per Week/30 Days Discharge Instructions: Apply over primary dressing as directed. Secondary Dressing: Zetuvit Plus 4x8 in 1 x Per Week/30 Days Discharge Instructions: Apply over primary dressing as directed. Com pression Wrap: ThreePress (3 layer  compression wrap) 1 x Per Week/30 Days Discharge Instructions: Apply three layer compression as directed. 08/11/2022: The dorsal foot wound is about the same size. It is a little bit dry with some slough accumulation. The right anterior tibial wound is also unchanged. The right medial leg wound is filling in further with good granulation tissue. Still with some slough buildup on the surface. I used a curette to debride slough and nonviable subcutaneous tissue from the dorsal foot and medial leg wounds. No debridement was necessary for the anterior tibial wound. We will continue Prisma silver collagen to the dorsal foot wound, moistening it with hydrogel. Continue Prisma to the anterior tibial wound, as well. We will continue to mix Santyl with his compounded sodium thiosulfate for the medial leg wound. Bilateral 3 layer compression. Follow-up in 1 week. Electronic  Signature(s) Signed: 08/11/2022 1:28:25 PM By: [name] Maudlin MD FACS Entered By: [name] on 08/11/2022 00:92:33 -------------------------------------------------------------------------------- HxROS Details Patient Name: Date of Service: ELSIE, SAKUMA [name] 08/11/2022 12:30 PM Medical Record Number: [id number] Patient Account Number: [id number] Date of Birth/Sex: Treating RN: [birth year] (72 y.o. M) Primary Care Provider: PA [name], NO Other Clinician: Referring Provider: Treating Provider/Extender: [name] in Treatment: 26 Information Obtained From Patient Chart Constitutional Symptoms (General Health) Medical History: Past Medical History Notes: obesity Eyes Medical History: Positive for: Cataracts - right eye removed Negative for: Glaucoma; Optic Neuritis Ear/Nose/Mouth/Throat KANISHK, STROEBEL ([id number]) [id number] Page 13 of 14 Medical History: Negative for: Chronic sinus problems/congestion; Middle ear problems Past Medical History Notes: hard of  hearing Hematologic/Lymphatic Medical History: Positive for: Anemia - macrocytic; Lymphedema Respiratory Medical History: Past Medical History Notes: frozen diaphragm right Cardiovascular Medical History: Positive for: Congestive Heart Failure; Deep Vein Thrombosis - left leg; Hypertension; Peripheral Venous Disease Past Medical History Notes: hyperlipidemia Endocrine Medical History: Negative for: Type I Diabetes; Type II Diabetes Genitourinary Medical History: Negative for: End Stage Renal Disease Integumentary (Skin) Medical History: Negative for: History of Burn Musculoskeletal Medical History: Past Medical History Notes: myositis, lumbar DDD, spinal stenosis, cervical facet joint syndrome Oncologic Medical History: Negative for: Received Chemotherapy; Received Radiation Psychiatric Medical History: Negative for: Anorexia/bulimia; Confinement Anxiety HBO Extended History Items Eyes: Cataracts Immunizations Pneumococcal Vaccine: Received Pneumococcal Vaccination: Yes Received Pneumococcal Vaccination On or After 60th Birthday: Yes Implantable Devices No devices added Hospitalization / Surgery History Type of Hospitalization/Surgery cervical fusion lumbar surgery coronary stent placement lasik eye surgery hydrocele excision Family and Social History Cancer: Yes - Mother; Diabetes: No; Heart Disease: Yes - Father,Mother; Hereditary Spherocytosis: No; Hypertension: Yes Sriansh, Farra ([id number]) [id number] Page 14 of 14 Disease: No; Lung Disease: Yes - Siblings; Seizures: No; Stroke: No; Thyroid Problems: No; Tuberculosis: No; Former smoker - quit 1991; Marital Status - Married; Alcohol Use: Moderate; Drug Use: No History; Caffeine Use: Daily - coffee; Financial Concerns: No; Food, Clothing or Shelter Needs: No; Support System Lacking: No; Transportation Concerns: No Electronic Signature(s) Signed: 08/11/2022  4:33:07 PM By: [name] Maudlin MD FACS Entered By: [name] on 08/11/2022 13:26:53 -------------------------------------------------------------------------------- SuperBill Details Patient Name: Date of Service: JAYSE, HODKINSON 08/11/2022 Medical Record Number: [id number] Patient Account Number: [id number] Date of Birth/Sex: Treating RN: 08/12/1950 (72 y.o. 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) I10 Essential (primary) hypertension I25.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 I89.0 Lymphedema, not elsewhere classified Z86.718 Personal history of other venous thrombosis and embolism Facility Procedures : CPT4 Code: [id number] Description: 11042 - DEB SUBQ TISSUE 20 SQ CM/< ICD-10 Diagnosis Description L97.812 Non-pressure chronic ulcer of other part of right lower leg with fat layer exp L97.522 Non-pressure chronic ulcer of other part of left foot with fat layer exposed L94.2  Calcinosis cutis Modifier: osed Quantity: 1 Physician Procedures : CPT4 Code Description Modifier [id number] 12248 - WC PHYS LEVEL 4 - EST PT 25 ICD-10 Diagnosis Description 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d L97.522 Non-pressure chronic ulcer of other part of left  foot with fat layer exposed 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

## 2022-08-18 ENCOUNTER — Encounter (HOSPITAL_BASED_OUTPATIENT_CLINIC_OR_DEPARTMENT_OTHER): Payer: Medicare Other | Admitting: General Surgery

## 2022-08-18 DIAGNOSIS — L942 Calcinosis cutis: Secondary | ICD-10-CM | POA: Diagnosis not present

## 2022-08-18 DIAGNOSIS — I251 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: Secondary | ICD-10-CM | POA: Diagnosis not present

## 2022-08-18 DIAGNOSIS — I872 Venous insufficiency (chronic) (peripheral): Secondary | ICD-10-CM | POA: Diagnosis not present

## 2022-08-18 DIAGNOSIS — L97812 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d: Secondary | ICD-10-CM | POA: Diagnosis not present

## 2022-08-18 DIAGNOSIS — I890 Lymphedema, not elsewhere classified: Secondary | ICD-10-CM | POA: Diagnosis not present

## 2022-08-18 DIAGNOSIS — L97522 Non-pressure chronic ulcer of other part of left foot with fat layer exposed: Secondary | ICD-10-CM | POA: Diagnosis not present

## 2022-08-18 NOTE — Progress Notes (Signed)
[name], [name] ([id number]) [id number] Page 1 of 15 Visit Report for 08/18/2022 Chief Complaint Document Details Patient Name: Date of Service: [name], [name] 08/18/2022 12:30 PM Medical Record Number: [id number] Patient Account Number: [id number] Date of Birth/Sex: Treating RN: June 20, 1951 (72 y.o. M) Primary Care Provider: PA [name], NO Other Clinician: Referring Provider: Treating Provider/Extender: [name] in Treatment: 27 Information Obtained from: Patient Chief Complaint Patient seen for complaints of Non-Healing Wounds. Electronic Signature(s) Signed: 08/18/2022 1:19:55 PM By: Fredirick Maudlin MD FACS Entered By: Fredirick Maudlin on 08/18/2022 13:19:55 -------------------------------------------------------------------------------- Debridement Details Patient Name: Date of Service: [name], [name] 08/18/2022 12:30 PM Medical Record Number: [id number] Patient Account Number: [id number] Date of Birth/Sex: Treating RN: [birth year] (72 y.o. [name] Primary Care Provider: PA [name], [place] Other Clinician: Referring Provider: Treating Provider/Extender: [name] in Treatment: 27 Debridement Performed for Assessment: Wound #1 Left,Dorsal Foot Performed By: Physician Fredirick Maudlin, MD Debridement Type: Debridement Level of Consciousness (Pre-procedure): Awake and Alert Pre-procedure Verification/Time Out Yes - 13:00 Taken: Start Time: 13:03 Pain Control: Lidocaine 5% topical ointment T Area Debrided (L x W): otal 1.2 (cm) x 1 (cm) = 1.2 (cm) Tissue and other material debrided: Non-Viable, Slough, Slough Level: Non-Viable Tissue Debridement Description: Selective/Open Wound Instrument: Curette Bleeding: Minimum Hemostasis Achieved: Pressure Procedural Pain: 0 Post Procedural Pain: 0 Response to Treatment: Procedure was tolerated well Level of Consciousness (Post- Awake and  Alert procedure): Post Debridement Measurements of Total Wound Length: (cm) 1.2 Width: (cm) 1 Depth: (cm) 0.1 Volume: (cm) 0.094 Character of Wound/Ulcer Post Debridement: Improved Post Procedure Diagnosis Same as [name], [name] ([id number]) [phone].pdf Page 2 of 15 Notes Scribed for Dr. [name] by Baruch Gouty, RN Electronic Signature(s) Signed: 08/18/2022 3:33:25 PM By: Fredirick Maudlin MD FACS Signed: 08/18/2022 5:16:51 PM By: Baruch Gouty RN, BSN Entered By: Baruch Gouty on 08/18/2022 13:07:04 -------------------------------------------------------------------------------- Debridement Details Patient Name: Date of Service: [name], [name] 08/18/2022 12:30 PM Medical Record Number: [id number] Patient Account Number: [id number] Date of Birth/Sex: Treating RN: May 02, 1951 (72 y.o. [name] Primary Care Provider: PA [name], [place] Other Clinician: Referring Provider: Treating Provider/Extender: [name] in Treatment: 27 Debridement Performed for Assessment: Wound #3 Right,Medial Lower Leg Performed By: Physician Fredirick Maudlin, MD Debridement Type: Debridement Level of Consciousness (Pre-procedure): Awake and Alert Pre-procedure Verification/Time Out Yes - 13:00 Taken: Start Time: 13:03 Pain Control: Lidocaine 5% topical ointment T Area Debrided (L x W): otal 3.3 (cm) x 4.8 (cm) = 15.84 (cm) Tissue and other material debrided: Viable, Non-Viable, Slough, Subcutaneous, Slough, Other: calcium deposit Level: Skin/Subcutaneous Tissue Debridement Description: Excisional Instrument: Curette Bleeding: Minimum Hemostasis Achieved: Pressure Procedural Pain: 0 Post Procedural Pain: 0 Response to Treatment: Procedure was tolerated well Level of Consciousness (Post- Awake and Alert procedure): Post Debridement Measurements of Total Wound Length: (cm) 3.3 Width: (cm) 4.8 Depth: (cm) 0.7 Volume: (cm)  8.708 Character of Wound/Ulcer Post Debridement: Improved Post Procedure Diagnosis Same as Pre-procedure Notes Scribed for Dr. [name] by Baruch Gouty, RN Electronic Signature(s) Signed: 08/18/2022 3:33:25 PM By: Fredirick Maudlin MD FACS Signed: 08/18/2022 5:16:51 PM By: Baruch Gouty RN, BSN Entered By: Baruch Gouty on 08/18/2022 13:07:16 HPI Details -------------------------------------------------------------------------------- [name] ([id number]) [id number] Page 3 of 15 Patient Name: Date of Service: [name], [name] 08/18/2022 12:30 PM Medical Record Number: [id number] Patient Account Number: [id number] Date of Birth/Sex: Treating RN: Jul 03, 1951 (72 y.o. M) Primary Care Provider: PA [name], NO Other Clinician: Referring Provider: Treating Provider/Extender: [name],  [name] in Treatment: 27 History of Present Illness HPI Description: ADMISSION 02/09/2022 This is a 72 year old man who recently moved to [place] from [place]. He has a history of of coronary artery disease and prior left popliteal vein DVT . He is not diabetic and does not smoke. 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 He has had multiple spinal surgeries and has limited mobility. He primarily uses a motorized wheelchair. He has had lymphedema for many years and does have lymphedema pumps although he says he does not use them frequently. He has wounds on his bilateral lower extremities. He was receiving care in [place] for these prior to his move. They were apparently packing his wounds with Betadine soaked gauze. He has worn compression wraps in the past but not recently. He did say that they helped tremendously. In clinic today, his right ABI is noncompressible but has good signals; the left ABI was 1.17. No formal vascular studies have been performed. On his left dorsal foot, there is a circular lesion that exposes the fat layer. There is  fairly heavy slough accumulation within the wound, but no significant odor. On his right medial calf, he has multiple pinholes that have slough buildup in them and probe for about 2 to 4 mm in depth. They are draining clear fluid. On his right lateral midfoot, he has ulceration consistent with venous stasis. It is geographic and has slough accumulation. 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 02/17/2022: All of the wounds are little bit larger today and have more slough accumulation. Today, the medial calf openings are larger and probe to something that feels calcified. There is odor coming from his wounds. His vascular studies are scheduled for August 9 and 10. 02-24-2022 upon evaluation today patient presents for follow-up concerning his ongoing issues here with his lower extremities. With that being said he does have significant problems with what appears to be cellulitis unfortunately the PCR culture that was sent last week got crushed by [employer] in transit. With that being said we are going to reobtain a culture today although I am actually to do it on the right medial leg as this seems to be the most inflamed area today where there is some questionable drainage as well. I am going to remove some of the calcium deposits which are loosening obtain a deeper culture from this area. Fortunately I do not see any evidence of active infection systemically which is good news but locally I think we are having a bigger issue here. He does have his appointment next Thursday with vascular. Patient has also been referred to [facility] for further evaluation and treatment of the calcinosis for possible sulfate injections. 03/04/2022: The culture that was performed last week grew out multiple species including Klebsiella, Morganella, and Pseudomonas aeruginosa. He had been prescribed Bactrim but this was not adequate to cover  all of the species and levofloxacin was recommended. He completed the Bactrim but did not pick up the levofloxacin and begin taking it until yesterday. 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 He had arterial studies performed today. He does have evidence of peripheral arterial disease. Results copied here: +-------+-----------+-----------+------------+------------+ ABI/TBIT oday's ABIT oday's TBIPrevious ABIPrevious TBI +-------+-----------+-----------+------------+------------+ Right Stites 0.75    +-------+-----------+-----------+------------+------------+ Left   0.57    +-------+-----------+-----------+------------+------------+ Arterial wall calcification precludes accurate ankle pressures and ABIs. Summary: Right: Resting right ankle-brachial index indicates noncompressible right lower extremity arteries. The right toe-brachial index is normal. Left: Resting left ankle-brachial index indicates noncompressible left lower extremity arteries. The left toe-brachial index is abnormal. He continues to have further tissue breakdown at the right medial calf and there is ample slough accumulation along with necrotic subcutaneous tissue. The left dorsal foot wound has built up slough, as well. The right medial foot wound is nearly closed with just a light layer of eschar over the surface. The right dorsal lateral foot wound has also accumulated a fair amount of slough and remains quite tender. 03/10/2022: He has been taking the prescribed levofloxacin and has about 3 days left of this. The erythema and induration on his right leg has improved. He continues to experience further breakdown of the right medial calf wound. There is extensive slough and debris accumulation there. There is heavy slough on his left dorsal  foot wound, but no odor or purulent drainage. The right medial foot wound appears to be closed. The right dorsal lateral foot wound also has a fair amount of slough but it is less tender than at our last visit. He still does not have an appointment with dermatology. 03/22/2022: The right anterior tibial wound has closed. The right lateral foot wound is nearly closed with just a little bit of slough. The left dorsal foot wound is smaller and continues to have some slough buildup but less so than at prior visits. There is good granulation tissue underlying the slough. Unfortunately the right medial calf wound continues to deteriorate. It has a foul odor today and a lot of necrotic tissue, the surrounding skin is also erythematous and moist. 03/30/2022: The right lateral foot wound continues to contract and just has a little bit of slough and eschar present. The left dorsal foot wound is also smaller with slough overlying good granulation tissue. The right medial calf wound actually looks quite a bit better today; there is no odor and there is much less necrotic tissue although some remains present. We have been using topical gentamicin and he has been taking oral Augmentin. 04/07/2022: The patient saw dermatology at [facility] last week. Unfortunately, it appears that they did not read any of the documentation that was provided. 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 Overall, it was a highly unsatisfactory consultation. In the meantime, however, we were able to identify compounding pharmacy who could create a topical sodium thiosulfate ointment. The patient has that with him today. Both of the right foot wounds are now closed. The left  dorsal foot wound has contracted but still has a layer of slough on the surface. The medial calf wounds on the right all look a little bit better. They still have heavy slough along with the chunks of calcium. Everything is stained purple. 04/14/2022: The wound on his dorsal left foot is a little bit smaller again today. There is still slough accumulation on the surface. The medial calf wounds have quite a bit less slough accumulation today. His right leg, however, is warm and erythematous, concerning for cellulitis. [name], [name] ([id number]) [id number] Page 4 of 15 04/14/2022: He completed his course of  Augmentin yesterday. The medial calf wound sites are much cleaner today and shallower. There is less fragmented calcium in the wounds. He has a lot of lymphedema fluid-like drainage from these wounds, but no purulent discharge or malodor. The wound on his dorsal left foot has also contracted and is shallower. There is a layer of slough over healthy granulation tissue. 05/05/2022: The left dorsal foot wound is smaller today with less slough and more granulation tissue. The right medial calf wounds are shallower, but have extensive slough and some fat necrosis. The drainage has diminished considerably, however. 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 05/12/2022: The left dorsal foot wound continues to contract and fill with granulation tissue. There is slough on the wound surface. The right medial calf wounds also continue to fill with healthy tissue, but there is remaining slough and fat necrosis present. He had a significant increase in his drainage this week and it was a bright yellow-green color. He also had a new wound open over his right anterior tibial surface associated with the spike of cutaneous calcium. The leg is more red, as well, concerning for  infection. 05/19/2022: His culture returned positive for Pseudomonas. He is currently taking a course of oral levofloxacin. We have also been using topical gentamicin mixed in with his sodium thiosulfate. All of the wounds look better this week. 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 There is slough and nonviable subcutaneous tissue still present. The new anterior tibial wound just has a light layer of slough. The dorsal foot wound also has some slough present and appears a little dry today. 05/26/2022: The right medial leg wound continues to improve. It is feeling with granulation tissue, but still accumulates a fairly thick layer of slough on the surface. The more recent anterior tibial wound has remained quite small, but also has some slough on the surface. The left dorsal foot wound has contracted somewhat and has perimeter epithelialization. He completed his oral levofloxacin. 06/02/2022: The left dorsal foot wound continues to improve significantly. There is just a little slough on the wound surface. The right medial leg wound had black drainage, but it looks like silver alginate was applied last week and I theorized that silver sulfide was created with a combination of the silver alginate and the sodium thiosulfate. It did, however, have a bit of an odor to it and extensive slough accumulation. The small anterior tibial wound also had a bit of slough and nonviable subcutaneous tissue present. The skin of his leg was rather macerated, as well. 06/09/2022: The left dorsal foot wound is smaller again this week with just a light layer of slough on the surface. The right medial leg wound looks substantially better. The leg and periwound skin are no longer red and macerated. There is good granulation tissue forming with less slough and nonviable tissue. The anterior tibial wound just has a small amount of slough accumulation. 06/16/2022: The left dorsal foot wound  continues to contract. His wrap slipped a little bit, however, and his foot is more swollen. The right medial leg wound continues to improve. The underlying tissue is more vital-appearing and there is less slough. He does have substantial drainage. The small anterior tibial wound has a bit of slough accumulation. 06/23/2022: The left dorsal foot wound is about half the size as it was last week. There is just  some light slough on the surface and some eschar buildup around the edges. The right anterior leg wound is small with a little slough on the surface. Unfortunately, the right medial leg wound deteriorated fairly substantially. It is malodorous with gray/green/black discoloration. 06/30/2022: The left dorsal foot wound continues to contract. It is nearly closed with just a light layer of slough present. The right anterior leg wound is about the same size and has a small amount of slough on the surface. The right medial leg wound looks a lot better than it did last week. The odor has abated and the tissue is less pulpy and necrotic-looking. There is still a fairly thick layer of slough present. 12/13; the left dorsal foot wound looks very healthy. There was no need to change the dressing here and no debridement. He has a small area on the right anterior lower leg apparently had not changed much in size. The area on the medial leg is the most problematic area this is large with some depth and a completely nonviable surface today. 07/14/2022: The left dorsal foot wound is smaller today with just a little slough and eschar present. The right anterior lower leg wound is about the same without any significant change. The medial leg wound has a black and green surface and is malodorous. No purulent drainage and the periwound skin is intact. Edema control is suboptimal. 07/21/2022: The dorsal foot wound continues to contract and is nearly closed with just a little bit of slough and eschar on the surface.  The right anterior lower leg wound is shallower today but otherwise the dimensions are unchanged. The medial leg wound looks significantly better although it still has a nonviable surface; the odor and black/green surface has resolved. His culture came back with multiple species sensitive to Augmentin, which was prescribed and a very low level of Pseudomonas, which should be handled by the topical gentamicin we have been using. 07/28/2022: The dorsal foot wound is down to just a very tiny opening with a little eschar on the surface. The right anterior tibial wound is about the same with some slough buildup. The right medial leg wound looks quite a bit better this week. There is thick rubbery slough on the surface, but the odor and drainage has improved significantly. 08/04/2022: The dorsal foot wound is almost healed, but the periwound skin has broken down. This appears to be secondary to moisture and adhesive from the absorbent pad. The right anterior tibial wound is shallower. The right medial leg wound continues to improve. It is smaller, still with rubbery slough on the surface. No malodor or purulent drainage. 08/11/2022: The dorsal foot wound is about the same size. It is a little bit dry with some slough accumulation. The right anterior tibial wound is also unchanged. The right medial leg wound is filling in further with good granulation tissue. Still with some slough buildup on the surface. 08/18/2022: His left leg is bright red, hot, and swollen with cellulitis. The dorsal foot wound actually looks quite good and is superficial with just a little bit of slough accumulation. The right anterior tibial wound is unchanged. The right medial leg wound has filled in with good granulation tissue but has copious amounts of drainage. Electronic Signature(s) Signed: 08/18/2022 1:20:50 PM By: Fredirick Maudlin MD FACS Entered By: Fredirick Maudlin on 08/18/2022  13:20:50 -------------------------------------------------------------------------------- Physical Exam Details Patient Name: Date of Service: [name], [name] 08/18/2022 12:30 PM Medical Record Number: [id number] Patient Account Number: [id number] Date of Birth/Sex:  Treating RN: 1951/05/30 (72 y.o. Lowella Dell ([id number]) [id number] Page 5 of 15 Primary Care Provider: PA [name], [place] Other Clinician: Referring Provider: Treating Provider/Extender: [name], [name] in Treatment: 27 Constitutional Slightly hypertensive. . . . no acute distress. Respiratory Normal work of breathing on room air. Notes 08/18/2022: His left leg is bright red, hot, and swollen with cellulitis. The dorsal foot wound actually looks quite good and is superficial with just a little bit of slough accumulation. The right anterior tibial wound is unchanged. The right medial leg wound has filled in with good granulation tissue but has copious amounts of drainage. Electronic Signature(s) Signed: 08/18/2022 1:29:47 PM By: Fredirick Maudlin MD FACS Previous Signature: 08/18/2022 1:21:03 PM Version By: Fredirick Maudlin MD FACS Entered By: Fredirick Maudlin on 08/18/2022 13:29:47 -------------------------------------------------------------------------------- Physician Orders Details Patient Name: Date of Service: [name], [name] 08/18/2022 12:30 PM Medical Record Number: [id number] Patient Account Number: [id number] Date of Birth/Sex: Treating RN: 07-31-1950 (72 y.o. [name] Primary Care Provider: PA [name], [place] Other Clinician: Referring Provider: Treating Provider/Extender: [name] in Treatment: 27 Verbal / Phone Orders: No Diagnosis Coding ICD-10 Coding Code Description [phone] 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d L97.522 Non-pressure chronic ulcer of other part of left foot with fat layer  exposed 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) I10 Essential (primary) hypertension I25.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 - Dr. [name] Rm 1 Return A Wednesday 1/31 @ 1230 pm Nurse Visit: - Friday 1/26 @ 0900 RM 1 Anesthetic Wound #1 Left,Dorsal Foot (In clinic) Topical Lidocaine 5% applied to wound bed Wound #3 Right,Medial Lower Leg (In clinic) Topical Lidocaine 5% applied to wound bed (In clinic) Topical Lidocaine 4% applied to wound bed Wound #6 Right,Anterior Lower Leg (In clinic) Topical Lidocaine 5% applied to wound bed (In clinic) Topical Lidocaine 4% applied to wound bed Bathing/ Shower/ Hygiene May shower with protection but do not get wound dressing(s) wet. Protect dressing(s) with water repellant cover (for example, large plastic bag) or a cast cover and may then take shower. Edema Control - Lymphedema / SCD / Other Lymphedema Pumps. Use Lymphedema pumps on leg(s) 2-3 times a day for 45-60 minutes. If wearing any wraps or hose, do not remove Crombie, Shyhiem ([id number]) [id number].pdf Page 6 of 15 them. Continue exercising as instructed. - use 1-2 times per day over wraps Elevate legs to the level of the heart or above for 30 minutes daily and/or when sitting for 3-4 times a day throughout the day. - throughout the day Exercise regularly [facility] for skilled nursing wound care. May utilize formulary equivalent dressing for wound treatment orders unless otherwise specified. New wound care orders this week; continue Home Health for wound care. May utilize formulary equivalent dressing for wound treatment orders unless otherwise specified. - bilateral lower extremity wraps to be changed  weekly Wound Treatment Wound #1 - Foot Wound Laterality: Dorsal, Left Peri-Wound Care: Triamcinolone 15 (g) 1 x Per Week/30 Days Discharge Instructions: Use triamcinolone 15 (g) as directed Peri-Wound Care: Sween Lotion (Moisturizing lotion) 1 x Per Week/30 Days Discharge Instructions: Apply moisturizing lotion as directed Prim Dressing: Promogran Prisma Matrix, 4.34 (sq in) (silver collagen) 1 x Per Week/30 Days ary Discharge Instructions: Moisten collagen with saline or hydrogel  Secondary Dressing: ABD Pad, 5x9 1 x Per Week/30 Days Discharge Instructions: Apply over primary dressing as directed. Secondary Dressing: Woven Gauze Sponge, Non-Sterile 4x4 in 1 x Per Week/30 Days Discharge Instructions: Apply over primary dressing as directed. Compression Wrap: ThreePress (3 layer compression wrap) 1 x Per Week/30 Days Discharge Instructions: Apply three layer compression as directed. Wound #3 - Lower Leg Wound Laterality: Right, Medial Peri-Wound Care: Triamcinolone 15 (g) 1 x Per Week/30 Days Discharge Instructions: Use triamcinolone 15 (g) as directed Peri-Wound Care: Zinc Oxide Ointment 30g tube 1 x Per Week/30 Days Discharge Instructions: Apply Zinc Oxide to periwound with each dressing change Peri-Wound Care: Sween Lotion (Moisturizing lotion) 1 x Per Week/30 Days Discharge Instructions: Apply moisturizing lotion as directed Prim Dressing: Santyl Ointment 1 x Per Week/30 Days ary Discharge Instructions: Apply nickel thick amount to wound bed as instructed Prim Dressing: sodium thiosulfate ointment 1 x Per Week/30 Days ary Discharge Instructions: 1:1 mix with santyl to wound bed Secondary Dressing: ABD Pad, 8x10 1 x Per Week/30 Days Discharge Instructions: Apply over primary dressing as directed. Secondary Dressing: Woven Gauze Sponge, Non-Sterile 4x4 in 1 x Per Week/30 Days Discharge Instructions: Apply over primary dressing as directed. Secondary Dressing: Zetuvit Plus 4x8 in 1 x  Per Week/30 Days Discharge Instructions: Apply over primary dressing as directed. Compression Wrap: ThreePress (3 layer compression wrap) 1 x Per Week/30 Days Discharge Instructions: Apply three layer compression as directed. Wound #6 - Lower Leg Wound Laterality: Right, Anterior Peri-Wound Care: Sween Lotion (Moisturizing lotion) 1 x Per Week/30 Days Discharge Instructions: Apply moisturizing lotion as directed Prim Dressing: Promogran Prisma Matrix, 4.34 (sq in) (silver collagen) 1 x Per Week/30 Days ary Discharge Instructions: Moisten collagen with saline or hydrogel Secondary Dressing: ABD Pad, 8x10 1 x Per Week/30 Days Discharge Instructions: Apply over primary dressing as directed. Secondary Dressing: Woven Gauze Sponge, Non-Sterile 4x4 in 1 x Per Week/30 Days Discharge Instructions: Apply over primary dressing as directed. Secondary Dressing: Zetuvit Plus 4x8 in 1 x Per Week/30 Days Discharge Instructions: Apply over primary dressing as directed. Compression Wrap: ThreePress (3 layer compression wrap) 1 x Per Week/30 Days Discharge Instructions: Apply three layer compression as directed. [name], [name] ([id number]) [id number] Page 7 of 15 Electronic Signature(s) Signed: 08/18/2022 3:33:25 PM By: Fredirick Maudlin MD FACS Previous Signature: 08/18/2022 1:13:18 PM Version By: Fredirick Maudlin MD FACS Entered By: Fredirick Maudlin on 08/18/2022 13:30:02 -------------------------------------------------------------------------------- Problem List Details Patient Name: Date of Service: [name], [name] 08/18/2022 12:30 PM Medical Record Number: [id number] Patient Account Number: [id number] Date of Birth/Sex: Treating RN: [birth year] (72 y.o. 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) 02/09/2022 No Yes I10 Essential (primary) hypertension 02/09/2022 No Yes I25.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 02/09/2022 No Yes I89.0 Lymphedema, not elsewhere classified 02/09/2022 No Yes Z86.718 Personal history of other venous thrombosis and embolism 02/09/2022 No Yes Inactive Problems ICD-10 Code Description Active Date Inactive Date L97.512 Non-pressure chronic ulcer of other part of right foot with fat layer exposed 02/17/2022 02/17/2022 Resolved Problems Electronic Signature(s) [name], [name] ([id number])  [phone].pdf Page 8 of 15 Signed: 08/18/2022 1:19:41 PM By: Fredirick Maudlin MD FACS Entered By: Fredirick Maudlin on 08/18/2022 13:19:40 -------------------------------------------------------------------------------- Progress Note Details Patient Name: Date of Service: [name], HARTLAND [name] 08/18/2022 12:30 PM Medical Record Number: [id number] Patient Account Number: [id number] Date of Birth/Sex: Treating RN: [birth year] (72 y.o. M) Primary Care Provider: PA [name], [place] Other Clinician: Referring Provider: Treating Provider/Extender: [name] in Treatment: 27 Subjective Chief Complaint Information obtained from Patient Patient seen for complaints of Non-Healing Wounds. History of Present Illness (HPI) ADMISSION 02/09/2022 This is a 72 year old man who recently moved to [place] from [place]. He has a history of of coronary artery disease and prior left popliteal vein DVT . He is not diabetic and does not smoke. 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  He has had multiple spinal surgeries and has limited mobility. He primarily uses a motorized wheelchair. He has had lymphedema for many years and does have lymphedema pumps although he says he does not use them frequently. He has wounds on his bilateral lower extremities. He was receiving care in [place] for these prior to his move. They were apparently packing his wounds with Betadine soaked gauze. He has worn compression wraps in the past but not recently. He did say that they helped tremendously. In clinic today, his right ABI is noncompressible but has good signals; the left ABI was 1.17. No formal vascular studies have been performed. On his left dorsal foot, there is a circular lesion that exposes the fat layer. There is fairly heavy slough accumulation within the wound, but no significant odor. On his right medial calf, he has multiple pinholes that have slough buildup in them and probe for about 2 to 4 mm in depth. They are draining clear fluid. On his right lateral midfoot, he has ulceration consistent with venous stasis. It is geographic and has slough accumulation. 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 02/17/2022: All of the wounds are little bit larger today and have more slough accumulation. Today, the medial calf openings are larger and probe to something that feels calcified. There is odor coming from his wounds. His vascular studies are scheduled for August 9 and 10. 02-24-2022 upon evaluation today patient presents for follow-up concerning his ongoing issues here with his lower extremities. With that being said he does have significant problems with what appears to be cellulitis unfortunately the PCR culture that was sent last week got crushed by [employer] in transit. With that being said we are going to reobtain a culture today although I am actually to do it on the right medial leg as this seems to be  the most inflamed area today where there is some questionable drainage as well. I am going to remove some of the calcium deposits which are loosening obtain a deeper culture from this area. Fortunately I do not see any evidence of active infection systemically which is good news but locally I think we are having a bigger issue here. He does have his appointment next Thursday with vascular. Patient has also been referred to [facility] for further evaluation and treatment of the calcinosis for possible sulfate injections. 03/04/2022: The culture that was performed last week grew out multiple species including Klebsiella, Morganella, and Pseudomonas aeruginosa. He had been prescribed Bactrim but this was not adequate to cover all of the species and levofloxacin was recommended. He completed the  Bactrim but did not pick up the levofloxacin and begin taking it until yesterday. 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 He had arterial studies performed today. He does have evidence of peripheral arterial disease. Results copied here: +-------+-----------+-----------+------------+------------+ ABI/TBIT oday's ABIT oday's TBIPrevious ABIPrevious TBI +-------+-----------+-----------+------------+------------+ Right [place] 0.75    +-------+-----------+-----------+------------+------------+ Left [place] 0.57    +-------+-----------+-----------+------------+------------+ Arterial wall calcification precludes accurate ankle pressures and ABIs. Summary: Right: Resting right ankle-brachial index indicates noncompressible right lower extremity arteries. The right toe-brachial index is normal. Left: Resting left ankle-brachial index indicates noncompressible left lower extremity arteries. The left toe-brachial index is abnormal. He  continues to have further tissue breakdown at the right medial calf and there is ample slough accumulation along with necrotic subcutaneous tissue. The left dorsal foot wound has built up slough, as well. The right medial foot wound is nearly closed with just a light layer of eschar over the surface. The right dorsal lateral foot wound has also accumulated a fair amount of slough and remains quite tender. 03/10/2022: He has been taking the prescribed levofloxacin and has about 3 days left of this. The erythema and induration on his right leg has improved. He continues to experience further breakdown of the right medial calf wound. There is extensive slough and debris accumulation there. There is heavy slough on his left dorsal foot wound, but no odor or purulent drainage. The right medial foot wound appears to be closed. The right dorsal lateral foot wound also has a fair [name], [name] ([id number]) [id number] Page 9 of 15 amount of slough but it is less tender than at our last visit. He still does not have an appointment with dermatology. 03/22/2022: The right anterior tibial wound has closed. The right lateral foot wound is nearly closed with just a little bit of slough. The left dorsal foot wound is smaller and continues to have some slough buildup but less so than at prior visits. There is good granulation tissue underlying the slough. Unfortunately the right medial calf wound continues to deteriorate. It has a foul odor today and a lot of necrotic tissue, the surrounding skin is also erythematous and moist. 03/30/2022: The right lateral foot wound continues to contract and just has a little bit of slough and eschar present. The left dorsal foot wound is also smaller with slough overlying good granulation tissue. The right medial calf wound actually looks quite a bit better today; there is no odor and there is much less necrotic tissue although some remains present. We have  been using topical gentamicin and he has been taking oral Augmentin. 04/07/2022: The patient saw dermatology at [facility] last week. Unfortunately, it appears that they did not read any of the documentation that was provided. 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 Overall, it was a highly unsatisfactory consultation. In the meantime, however, we were able to identify compounding pharmacy who could create a topical sodium thiosulfate ointment. The patient has that with him today. Both of the right foot wounds are now closed. The left dorsal foot wound has contracted but still has a layer of slough on the  surface. The medial calf wounds on the right all look a little bit better. They still have heavy slough along with the chunks of calcium. Everything is stained purple. 04/14/2022: The wound on his dorsal left foot is a little bit smaller again today. There is still slough accumulation on the surface. The medial calf wounds have quite a bit less slough accumulation today. His right leg, however, is warm and erythematous, concerning for cellulitis. 04/14/2022: He completed his course of Augmentin yesterday. The medial calf wound sites are much cleaner today and shallower. There is less fragmented calcium in the wounds. He has a lot of lymphedema fluid-like drainage from these wounds, but no purulent discharge or malodor. The wound on his dorsal left foot has also contracted and is shallower. There is a layer of slough over healthy granulation tissue. 05/05/2022: The left dorsal foot wound is smaller today with less slough and more granulation tissue. The right medial calf wounds are shallower, but have extensive slough and some fat  necrosis. The drainage has diminished considerably, however. 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 05/12/2022: The left dorsal foot wound continues to contract and fill with granulation tissue. There is slough on the wound surface. The right medial calf wounds also continue to fill with healthy tissue, but there is remaining slough and fat necrosis present. He had a significant increase in his drainage this week and it was a bright yellow-green color. He also had a new wound open over his right anterior tibial surface associated with the spike of cutaneous calcium. The leg is more red, as well, concerning for infection. 05/19/2022: His culture returned positive for Pseudomonas. He is currently taking a course of oral levofloxacin. We have also been using topical gentamicin mixed in with his sodium thiosulfate. All of the wounds look better this week. 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 There is slough and nonviable subcutaneous tissue still present. The new anterior tibial wound just has a light layer of slough. The dorsal foot wound also has some slough present and appears a little dry today. 05/26/2022: The right medial leg wound continues to improve. It is feeling with granulation tissue, but still accumulates a fairly thick layer of slough on the surface. The more recent anterior tibial wound has remained quite small, but also has some slough on the surface. The left dorsal foot wound has contracted somewhat and has perimeter epithelialization. He completed his oral levofloxacin. 06/02/2022: The left dorsal foot wound continues to improve significantly. There is just a little slough on the wound surface. The right medial leg wound had black drainage, but it looks like silver alginate was applied last week and I theorized that silver sulfide  was created with a combination of the silver alginate and the sodium thiosulfate. It did, however, have a bit of an odor to it and extensive slough accumulation. The small anterior tibial wound also had a bit of slough and nonviable subcutaneous tissue present. The skin of his leg was rather macerated, as well. 06/09/2022: The left dorsal foot wound is smaller again this week with just a light layer of slough on the surface. The right medial leg wound looks substantially better. The leg and periwound skin are no longer red and macerated. There is good granulation tissue forming with less slough and nonviable tissue. The anterior tibial wound just has a  small amount of slough accumulation. 06/16/2022: The left dorsal foot wound continues to contract. His wrap slipped a little bit, however, and his foot is more swollen. The right medial leg wound continues to improve. The underlying tissue is more vital-appearing and there is less slough. He does have substantial drainage. The small anterior tibial wound has a bit of slough accumulation. 06/23/2022: The left dorsal foot wound is about half the size as it was last week. There is just some light slough on the surface and some eschar buildup around the edges. The right anterior leg wound is small with a little slough on the surface. Unfortunately, the right medial leg wound deteriorated fairly substantially. It is malodorous with gray/green/black discoloration. 06/30/2022: The left dorsal foot wound continues to contract. It is nearly closed with just a light layer of slough present. The right anterior leg wound is about the same size and has a small amount of slough on the surface. The right medial leg wound looks a lot better than it did last week. The odor has abated and the tissue is less pulpy and necrotic-looking. There is still a fairly thick layer of slough present. 12/13; the left dorsal foot wound looks very healthy. There was no need to change  the dressing here and no debridement. He has a small area on the right anterior lower leg apparently had not changed much in size. The area on the medial leg is the most problematic area this is large with some depth and a completely nonviable surface today. 07/14/2022: The left dorsal foot wound is smaller today with just a little slough and eschar present. The right anterior lower leg wound is about the same without any significant change. The medial leg wound has a black and green surface and is malodorous. No purulent drainage and the periwound skin is intact. Edema control is suboptimal. 07/21/2022: The dorsal foot wound continues to contract and is nearly closed with just a little bit of slough and eschar on the surface. The right anterior lower leg wound is shallower today but otherwise the dimensions are unchanged. The medial leg wound looks significantly better although it still has a nonviable surface; the odor and black/green surface has resolved. His culture came back with multiple species sensitive to Augmentin, which was prescribed and a very low level of Pseudomonas, which should be handled by the topical gentamicin we have been using. 07/28/2022: The dorsal foot wound is down to just a very tiny opening with a little eschar on the surface. The right anterior tibial wound is about the same with some slough buildup. The right medial leg wound looks quite a bit better this week. There is thick rubbery slough on the surface, but the odor and drainage has improved significantly. 08/04/2022: The dorsal foot wound is almost healed, but the periwound skin has broken down. This appears to be secondary to moisture and adhesive from the absorbent pad. The right anterior tibial wound is shallower. The right medial leg wound continues to improve. It is smaller, still with rubbery slough on the surface. No malodor or purulent drainage. 08/11/2022: The dorsal foot wound is about the same size. It is a  little bit dry with some slough accumulation. The right anterior tibial wound is also unchanged. The right medial leg wound is filling in further with good granulation tissue. Still with some slough buildup on the surface. 08/18/2022: His left leg is bright red, hot, and swollen with cellulitis. The dorsal foot wound actually looks quite  good and is superficial with just a little bit of slough accumulation. The right anterior tibial wound is unchanged. The right medial leg wound has filled in with good granulation tissue but has copious amounts of drainage. KINSER, FELLMAN ([id number]) [id number] Page 10 of 15 Patient History Information obtained from Patient, Chart. Family History Cancer - Mother, Heart Disease - Father,Mother, Hypertension - Father,Siblings, Lung Disease - Siblings, No family history of Diabetes, Hereditary Spherocytosis, Kidney Disease, Seizures, Stroke, Thyroid Problems, Tuberculosis. Social History Former smoker - quit 1991, Marital Status - Married, Alcohol Use - Moderate, Drug Use - No History, Caffeine Use - Daily - coffee. Medical History Eyes Patient has history of Cataracts - right eye removed Denies history of Glaucoma, Optic Neuritis Ear/Nose/Mouth/Throat Denies history of Chronic sinus problems/congestion, Middle ear problems Hematologic/Lymphatic Patient has history of Anemia - macrocytic, Lymphedema Cardiovascular Patient has history of Congestive Heart Failure, Deep Vein Thrombosis - left leg, Hypertension, Peripheral Venous Disease Endocrine Denies history of Type I Diabetes, Type II Diabetes Genitourinary Denies history of End Stage Renal Disease Integumentary (Skin) Denies history of History of Burn Oncologic Denies history of Received Chemotherapy, Received Radiation Psychiatric Denies history of Anorexia/bulimia, Confinement Anxiety Hospitalization/Surgery History - cervical fusion. - lumbar surgery. - coronary stent  placement. - lasik eye surgery. - hydrocele excision. Medical A Surgical History Notes nd Constitutional Symptoms (General Health) obesity Ear/Nose/Mouth/Throat hard of hearing Respiratory frozen diaphragm right Cardiovascular hyperlipidemia Musculoskeletal myositis, lumbar DDD, spinal stenosis, cervical facet joint syndrome Objective Constitutional Slightly hypertensive. no acute distress. Vitals Time Taken: 12:30 PM, Weight: 267 lbs, Temperature: 97.9 F, Pulse: 65 bpm, Respiratory Rate: 18 breaths/min, Blood Pressure: 144/77 mmHg. Respiratory Normal work of breathing on room air. General Notes: 08/18/2022: His left leg is bright red, hot, and swollen with cellulitis. The dorsal foot wound actually looks quite good and is superficial with just a little bit of slough accumulation. The right anterior tibial wound is unchanged. The right medial leg wound has filled in with good granulation tissue but has copious amounts of drainage. Integumentary (Hair, Skin) Wound #1 status is Open. Original cause of wound was Gradually Appeared. The date acquired was: 12/07/2021. The wound has been in treatment 27 weeks. The wound is located on the Left,Dorsal Foot. The wound measures 1.2cm length x 1cm width x 0.1cm depth; 0.942cm^2 area and 0.094cm^3 volume. There is Fat Layer (Subcutaneous Tissue) exposed. There is no tunneling or undermining noted. There is a large amount of serous drainage noted. The wound margin is flat and intact. There is small (1-33%) red, pink granulation within the wound bed. There is a large (67-100%) amount of necrotic tissue within the wound bed including Adherent Slough. The periwound skin appearance exhibited: Excoriation, Erythema. The periwound skin appearance did not exhibit: Dry/Scaly, Maceration, Hemosiderin Staining, Rubor. The surrounding wound skin color is noted with erythema with red streaks. Periwound temperature was noted as Hot. The periwound has tenderness  on palpation. Wound #3 status is Open. Original cause of wound was Gradually Appeared. The date acquired was: 12/07/2021. The wound has been in treatment 27 weeks. The wound is located on the Right,Medial Lower Leg. The wound measures 3.3cm length x 4.8cm width x 0.7cm depth; 12.441cm^2 area and 8.708cm^3 volume. There is Fat Layer (Subcutaneous Tissue) exposed. There is no tunneling or undermining noted. There is a large amount of serosanguineous drainage noted. The wound margin is distinct with the outline attached to the wound base. There is small (1-33%) pink granulation within the wound bed. There  is a large (67- 100%) amount of necrotic tissue within the wound bed including Adherent Slough. The periwound skin appearance exhibited: Excoriation, Hemosiderin Staining, Rubor. The periwound skin appearance did not exhibit: Dry/Scaly, Maceration, Erythema. Periwound temperature was noted as No Abnormality. The periwound has tenderness on palpation. Wound #6 status is Open. Original cause of wound was Gradually Appeared. The date acquired was: 05/12/2022. The wound has been in treatment 14 weeks. The wound is located on the Right,Anterior Lower Leg. The wound measures 0.3cm length x 0.2cm width x 0.3cm depth; 0.047cm^2 area and 0.014cm^3 [name], [name] ([id number]) [phone].pdf Page 11 of 15 volume. There is Fat Layer (Subcutaneous Tissue) exposed. There is no tunneling or undermining noted. There is a medium amount of purulent drainage noted. The wound margin is epibole. There is small (1-33%) red granulation within the wound bed. There is no necrotic tissue within the wound bed. The periwound skin appearance had no abnormalities noted for texture. The periwound skin appearance had no abnormalities noted for moisture. The periwound skin appearance exhibited: Hemosiderin Staining. The periwound skin appearance did not exhibit: Atrophie Blanche, Cyanosis, Ecchymosis, Mottled,  Pallor, Rubor, Erythema. Periwound temperature was noted as No Abnormality. 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 There was a Selective/Open Wound Non-Viable Tissue Debridement with a total area of 1.2 sq cm performed by Fredirick Maudlin, MD. With the following instrument(s): Curette to remove Non-Viable tissue/material. Material removed includes [facility] and Other: calcium deposit after achieving pain control using Lidocaine 5% topical ointment. No specimens were taken. A time out was conducted at 13:00, prior to the start of the procedure. A Minimum amount of bleeding was controlled with Pressure. The procedure was tolerated well with a pain level of 0 throughout and a pain level of 0 following the procedure. Post Debridement Measurements: 1.2cm length x 1cm width x 0.1cm depth; 0.094cm^3 volume. Character of Wound/Ulcer Post Debridement is improved. Post procedure Diagnosis Wound #1: Same as Pre-Procedure General Notes: Scribed for Dr. [name] by Baruch Gouty, RN. Pre-procedure diagnosis of Wound #1 is a Lymphedema located on the Left,Dorsal Foot . There was a Double Layer Compression Therapy Procedure by Baruch Gouty, RN. Post procedure Diagnosis Wound #1: Same as Pre-Procedure Wound #3 Pre-procedure diagnosis of Wound #3 is a Lymphedema located on the Right,Medial Lower Leg . There was a Excisional Skin/Subcutaneous  Tissue Debridement with a total area of 15.84 sq cm performed by Fredirick Maudlin, MD. With the following instrument(s): Curette to remove Viable and Non-Viable tissue/material. Material removed includes Subcutaneous Tissue, Slough, and Other: calcium deposit after achieving pain control using Lidocaine 5% topical ointment. No specimens were taken. A time out was conducted at 13:00, prior to the start of the procedure. A Minimum amount of bleeding was controlled with Pressure. The procedure was tolerated well with a pain level of 0 throughout and a pain level of 0 following the procedure. Post Debridement Measurements: 3.3cm length x 4.8cm width x 0.7cm depth; 8.708cm^3 volume. Character of Wound/Ulcer Post Debridement is improved. Post procedure Diagnosis Wound #3: Same as Pre-Procedure General  Notes: Scribed for Dr. [name] by Baruch Gouty, RN. Pre-procedure diagnosis of Wound #3 is a Lymphedema located on the Right,Medial Lower Leg . There was a Double Layer Compression Therapy Procedure by Baruch Gouty, RN. Post procedure Diagnosis Wound #3: Same as Pre-Procedure Plan Follow-up Appointments: Return Appointment in 1 week. - Dr. [name] Rm 1 Wednesday 1/31 @ 1230 pm Nurse Visit: - Friday 1/26 @ 0900 RM 1 Anesthetic: Wound #1 Left,Dorsal Foot: (In clinic) Topical Lidocaine 5% applied to wound bed Wound #3 Right,Medial Lower Leg: (In clinic) Topical Lidocaine 5% applied to wound bed (In clinic) Topical Lidocaine 4% applied to wound bed Wound #6 Right,Anterior Lower Leg: (In clinic) Topical Lidocaine 5% applied to wound bed (In clinic) Topical Lidocaine 4% applied to wound bed Bathing/ Shower/ Hygiene: May shower with protection but do not get wound dressing(s) wet. Protect dressing(s) with water repellant cover (for example, large plastic bag) or a cast cover and may then take shower. Edema Control - Lymphedema / SCD / Other: Lymphedema Pumps. Use Lymphedema pumps on leg(s) 2-3 times  a day for 45-60 minutes. If wearing any wraps or hose, do not remove them. Continue exercising as instructed. - use 1-2 times per day over wraps Elevate legs to the level of the heart or above for 30 minutes daily and/or when sitting for 3-4 times a day throughout the day. - throughout the day [name], [name] ([id number]) [id number] Page 12 of 15 Exercise regularly Home Health: Admit to [facility] for skilled nursing wound care. May utilize formulary equivalent dressing for wound treatment orders unless otherwise specified. New wound care orders this week; continue Home Health for wound care. May utilize formulary equivalent dressing for wound treatment orders unless otherwise specified. - bilateral lower extremity wraps to be changed weekly WOUND #1: - Foot Wound Laterality: Dorsal, Left Peri-Wound Care: Triamcinolone 15 (g) 1 x Per Week/30 Days Discharge Instructions: Use triamcinolone 15 (g) as directed Peri-Wound Care: Sween Lotion (Moisturizing lotion) 1 x Per Week/30 Days Discharge Instructions: Apply moisturizing lotion as directed Prim Dressing: Promogran Prisma Matrix, 4.34 (sq in) (silver collagen) 1 x Per Week/30 Days ary Discharge Instructions: Moisten collagen with saline or hydrogel Secondary Dressing: ABD Pad, 5x9 1 x Per Week/30 Days Discharge Instructions: Apply over primary dressing as directed. Secondary Dressing: Woven Gauze Sponge, Non-Sterile 4x4 in 1 x Per Week/30 Days Discharge Instructions: Apply over primary dressing as directed. Com pression Wrap: ThreePress (3 layer compression wrap) 1 x Per Week/30 Days Discharge Instructions: Apply three layer compression as directed. WOUND #3: - Lower Leg Wound Laterality: Right, Medial Peri-Wound Care: Triamcinolone 15 (g) 1 x Per Week/30 Days Discharge Instructions: Use triamcinolone 15 (g) as directed Peri-Wound Care: Zinc Oxide Ointment 30g tube 1 x Per Week/30 Days Discharge Instructions: Apply  Zinc Oxide to periwound with each dressing change Peri-Wound Care: Sween Lotion (Moisturizing lotion) 1 x Per Week/30 Days Discharge Instructions: Apply moisturizing lotion as directed Prim Dressing: Santyl Ointment 1 x Per Week/30 Days ary Discharge Instructions: Apply nickel thick amount to wound bed as instructed Prim Dressing: sodium thiosulfate ointment 1 x Per Week/30 Days ary Discharge Instructions: 1:1 mix with santyl to wound bed Secondary Dressing: ABD Pad, 8x10 1 x Per Week/30 Days Discharge Instructions: Apply over primary dressing as directed. Secondary Dressing: Woven Gauze Sponge, Non-Sterile 4x4 in 1 x Per Week/30 Days Discharge Instructions: Apply over primary dressing as directed. Secondary Dressing: Zetuvit Plus 4x8 in 1 x Per Week/30 Days Discharge Instructions: Apply over primary dressing as  directed. Com pression Wrap: ThreePress (3 layer compression wrap) 1 x Per Week/30 Days Discharge Instructions: Apply three layer compression as directed. WOUND #6: - Lower Leg Wound Laterality: Right, Anterior Peri-Wound Care: Sween Lotion (Moisturizing lotion) 1 x Per Week/30 Days Discharge Instructions: Apply moisturizing lotion as directed Prim Dressing: Promogran Prisma Matrix, 4.34 (sq in) (silver collagen) 1 x Per Week/30 Days ary Discharge Instructions: Moisten collagen with saline or hydrogel Secondary Dressing: ABD Pad, 8x10 1 x Per Week/30 Days Discharge Instructions: Apply over primary dressing as directed. Secondary Dressing: Woven Gauze Sponge, Non-Sterile 4x4 in 1 x Per Week/30 Days Discharge Instructions: Apply over primary dressing as directed. Secondary Dressing: Zetuvit Plus 4x8 in 1 x Per Week/30 Days Discharge Instructions: Apply over primary dressing as directed. Com pression Wrap: ThreePress (3 layer compression wrap) 1 x Per Week/30 Days Discharge Instructions: Apply three layer compression as directed. 08/18/2022: His left leg is bright red, hot, and  swollen with cellulitis. The dorsal foot wound actually looks quite good and is superficial with just a little bit of slough accumulation. The right anterior tibial wound is unchanged. The right medial leg wound has filled in with good granulation tissue but has copious amounts of drainage. I used a curette to debride slough and nonviable subcutaneous tissue from the right medial leg wound. I debrided slough from the dorsal foot wound. We will continue the mixture of Santyl and topical sodium thiosulfate to the medial leg wound. Prisma silver collagen to the other wounds. We will try an outpatient course of oral Augmentin and doxycycline. He will have a nurse visit on Friday for both a dressing change as well as a recheck of his cellulitis. If it is not improving, he will likely require hospital admission for IV antibiotic therapy. We are will also work on getting home health so that his dressing can be changed midweek due to the substantial drainage. I will check in on him at his nurse visit on Friday. Scheduled follow-up for a wound care visit with me in 1 week's time. Electronic Signature(s) Signed: 08/18/2022 1:31:50 PM By: Fredirick Maudlin MD FACS Entered By: Fredirick Maudlin on 08/18/2022 13:31:50 -------------------------------------------------------------------------------- HxROS Details Patient Name: Date of Service: SVEN, PINHEIRO [name] 08/18/2022 12:30 PM Medical Record Number: [id number] Patient Account Number: [id number] Date of Birth/Sex: Treating RN: 09-Jan-1951 (73 y.o. M) Primary Care Provider: PA Darnelle Spangle Other Clinician: Referring Provider: Treating Provider/Extender: [name] in Treatment: [address], [place] ([id number]) [id number] Page 13 of 15 Information Obtained From Patient Chart Constitutional Symptoms (General Health) Medical History: Past Medical History Notes: obesity Eyes Medical History: Positive for: Cataracts  - right eye removed Negative for: Glaucoma; Optic Neuritis Ear/Nose/Mouth/Throat Medical History: Negative for: Chronic sinus problems/congestion; Middle ear problems Past Medical History Notes: hard of hearing Hematologic/Lymphatic Medical History: Positive for: Anemia - macrocytic; Lymphedema Respiratory Medical History: Past Medical History Notes: frozen diaphragm right Cardiovascular Medical History: Positive for: Congestive Heart Failure; Deep Vein Thrombosis - left leg; Hypertension; Peripheral Venous Disease Past Medical History Notes: hyperlipidemia Endocrine Medical History: Negative for: Type I Diabetes; Type II Diabetes Genitourinary Medical History: Negative for: End Stage Renal Disease Integumentary (Skin) Medical History: Negative for: History of Burn Musculoskeletal Medical History: Past Medical History Notes: myositis, lumbar DDD, spinal stenosis, cervical facet joint syndrome Oncologic Medical History: Negative for: Received Chemotherapy; Received Radiation Psychiatric Medical History: Negative for: Anorexia/bulimia; Confinement Anxiety HBO Extended History Items Eyes: Cataracts Immunizations ELWARD, NOCERA ([id number]) [id number] Page 14 of 15 Pneumococcal Vaccine: Received  Pneumococcal Vaccination: Yes Received Pneumococcal Vaccination On or After 60th Birthday: Yes Implantable Devices No devices added Hospitalization / Surgery History Type of Hospitalization/Surgery cervical fusion lumbar surgery coronary stent placement lasik eye surgery hydrocele excision Family and Social History Cancer: Yes - Mother; Diabetes: No; Heart Disease: Yes - Father,Mother; Hereditary Spherocytosis: No; Hypertension: Yes - Father,Siblings; Kidney Disease: No; Lung Disease: Yes - Siblings; Seizures: No; Stroke: No; Thyroid Problems: No; Tuberculosis: No; Former smoker - quit 1991; Marital Status - Married; Alcohol Use: Moderate; Drug  Use: No History; Caffeine Use: Daily - coffee; Financial Concerns: No; Food, Clothing or Shelter Needs: No; Support System Lacking: No; Transportation Concerns: No Electronic Signature(s) Signed: 08/18/2022 3:33:25 PM By: Fredirick Maudlin MD FACS Entered By: Fredirick Maudlin on 08/18/2022 13:20:55 -------------------------------------------------------------------------------- SuperBill Details Patient Name: Date of Service: PIERCE, BAROCIO 08/18/2022 Medical Record Number: [id number] Patient Account Number: [id number] Date of Birth/Sex: Treating RN: 11-11-1950 (72 y.o. M) Primary Care Provider: PA [name], NO Other Clinician: Referring Provider: Treating Provider/Extender: [name] in Treatment: 27 Diagnosis Coding ICD-10 Codes Code Description [phone] 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d L97.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) I10 Essential (primary) hypertension I25.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 I89.0 Lymphedema, not elsewhere classified Z86.718 Personal history of other venous thrombosis and embolism Facility Procedures : CPT4 Code: [id number] Description: 46962 - DEB SUBQ TISSUE 20 SQ CM/< ICD-10 Diagnosis Description 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 Modifier: ed Quantity: 1 : CPT4 Code: [id number] Description: 44010 - DEBRIDE WOUND 1ST 20 SQ CM OR < ICD-10 Diagnosis Description L97.522 Non-pressure chronic ulcer of other part of left foot with fat layer exposed 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) Modifier: Quantity: 1 Physician Procedures : CPT4 Code Description Modifier ISAID, SALVIA ([id number]) [id number] - WC  PHYS LEVEL 4 - EST PT 25 1 ICD-10 Diagnosis Description L97.812 Non-pressure chronic ulcer of other part of right lower leg with fat layer  exposed L97.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) Quantity: .pdf Page 15 of 15 : [id number] 11042 - WC PHYS SUBQ TISS 20 SQ CM 1 ICD-10 Diagnosis Description 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 Quantity: : [id number] 18841 - WC PHYS DEBR WO ANESTH 20 SQ CM 1 ICD-10 Diagnosis Description L97.522 Non-pressure chronic ulcer of other part of left foot with fat layer exposed 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) Quantity: Electronic Signature(s) Signed: 08/18/2022 1:32:17 PM By: Fredirick Maudlin MD FACS Entered By: Fredirick Maudlin on 08/18/2022 13:32:16

## 2022-08-18 NOTE — Progress Notes (Signed)
[name] ([id number]) [id number] Page 1 of 12 Visit Report for 08/18/2022 Arrival Information Details Patient Name: Date of Service: [name], [name] 08/18/2022 12:30 PM Medical Record Number: [id number] Patient Account Number: [id number] Date of Birth/Sex: Treating RN: [birth year] (72 y.o. [name] Primary Care Darric Plante: PA [name], [place] Other Clinician: Referring Jafet Wissing: Treating Tullio Chausse/Extender: [name] in Treatment: 27 Visit Information History Since Last Visit Added or deleted any medications: No Patient Arrived: Wheel Chair Any new allergies or adverse reactions: No Arrival Time: 12:27 Had a fall or experienced change in No Accompanied By: self activities of daily living that may affect Transfer Assistance: None risk of falls: Patient Identification Verified: Yes Signs or symptoms of abuse/neglect since last visito No Secondary Verification Process Completed: Yes Hospitalized since last visit: No Patient Requires Transmission-Based Precautions: No Implantable device outside of the clinic excluding No Patient Has Alerts: Yes cellular tissue based products placed in the center Patient Alerts: R ABI= N/C, TBI= .75 since last visit: L ABI =N/C, TBI = .57 Has Dressing in Place as Prescribed: Yes Has Compression in Place as Prescribed: Yes Pain Present Now: Yes Electronic Signature(s) Signed: 08/18/2022 5:16:51 PM By: Baruch Gouty RN, BSN Entered By: Baruch Gouty on 08/18/2022 12:28:29 -------------------------------------------------------------------------------- Compression Therapy Details Patient Name: Date of Service: [name] 08/18/2022 12:30 PM Medical Record Number: [id number] Patient Account Number: [id number] Date of Birth/Sex: Treating RN: 1951-07-15 (72 y.o. [name] Primary Care Marquay Kruse: PA [name], [place] Other Clinician: Referring Alexandar Weisenberger: Treating [name] [name]/Extender: [name] in Treatment: 27 Compression Therapy Performed for Wound Assessment: Wound #1 Left,Dorsal Foot Performed By: Clinician Baruch Gouty, RN Compression Type: Double Layer Post Procedure Diagnosis Same as Pre-procedure Electronic Signature(s) Signed: 08/18/2022 5:16:51 PM By: Baruch Gouty RN, BSN Entered By: Baruch Gouty on 08/18/2022 13:10:46 [name] ([id number]) [id number] Page 2 of 12 -------------------------------------------------------------------------------- Compression Therapy Details Patient Name: Date of Service: [name] 08/18/2022 12:30 PM Medical Record Number: [id number] Patient Account Number: [id number] Date of Birth/Sex: Treating RN: March 27, 1951 (72 y.o. [name] Primary Care Xoey Warmoth: PA [name], [place] Other Clinician: Referring Vonne Mcdanel: Treating Evian Salguero/Extender: [name] in Treatment: 27 Compression Therapy Performed for Wound Assessment: Wound #3 Right,Medial Lower Leg Performed By: Clinician Baruch Gouty, RN Compression Type: Double Layer Post Procedure Diagnosis Same as Pre-procedure Electronic Signature(s) Signed: 08/18/2022 5:16:51 PM By: Baruch Gouty RN, BSN Entered By: Baruch Gouty on 08/18/2022 13:10:46 -------------------------------------------------------------------------------- Encounter Discharge Information Details Patient Name: Date of Service: [name] 08/18/2022 12:30 PM Medical Record Number: [id number] Patient Account Number: [id number] Date of Birth/Sex: Treating RN: [birth year] (72 y.o. [name] Primary Care Lynnann Knudsen: PA [name], [place] Other Clinician: Referring Amunique Neyra: Treating Mabelle Mungin/Extender: [name] in Treatment: 27 Encounter Discharge Information Items Post Procedure Vitals Discharge Condition: Stable Temperature (F): 97.9 Ambulatory Status: Wheelchair Pulse (bpm): 65 Discharge  Destination: Home Respiratory Rate (breaths/min): 18 Transportation: Private Auto Blood Pressure (mmHg): 144/77 Accompanied By: self Schedule Follow-up Appointment: Yes Clinical Summary of Care: Patient Declined Electronic Signature(s) Signed: 08/18/2022 5:16:51 PM By: Baruch Gouty RN, BSN Entered By: Baruch Gouty on 08/18/2022 15:00:10 -------------------------------------------------------------------------------- Lower Extremity Assessment Details Patient Name: Date of Service: [name] 08/18/2022 12:30 PM Medical Record Number: [id number] Patient Account Number: [id number] Date of Birth/Sex: Treating RN: 15-Nov-1950 (72 y.o. [name] Primary Care Jartavious Mckimmy: PA [name], [place] Other Clinician: Referring Lathyn Griggs: Treating Shatoya Roets/Extender: Valeria Batman Weeks in Treatment: 27 Edema Assessment Assessed: [Left:  No] [Right: No] Edema: [Left: Yes] [Right: Yes] Calf DAELYN, MOZER ([id number]) [id number] Page 3 of 12 Left: Right: Point of Measurement: From Medial Instep 42.5 cm 44 cm Ankle Left: Right: Point of Measurement: From Medial Instep 26.5 cm 27.5 cm Vascular Assessment Pulses: Dorsalis Pedis Palpable: [Left:No] [Right:No] Electronic Signature(s) Signed: 08/18/2022 5:16:51 PM By: Baruch Gouty RN, BSN Entered By: Baruch Gouty on 08/18/2022 12:38:36 -------------------------------------------------------------------------------- Multi Wound Chart Details Patient Name: Date of Service: [name] 08/18/2022 12:30 PM Medical Record Number: [id number] Patient Account Number: [id number] Date of Birth/Sex: Treating RN: [birth year] (72 y.o. M) Primary Care Remedy Corporan: PA [name], NO Other Clinician: Referring Caster Fayette: Treating Sofya Moustafa/Extender: [name], [name] in Treatment: 27 Vital Signs Height(in): Pulse(bpm): 65 Weight(lbs): 76 Blood Pressure(mmHg): 144/77 Body Mass  Index(BMI): Temperature(F): 97.9 Respiratory Rate(breaths/min): 18 [1:Photos:] Left, Dorsal Foot Right, Medial Lower Leg Right, Anterior Lower Leg Wound Location: Gradually Appeared Gradually Appeared Gradually Appeared Wounding Event: Lymphedema Lymphedema Calcinosis Primary Etiology: Cataracts, Anemia, Lymphedema, Cataracts, Anemia, Lymphedema, Cataracts, Anemia, Lymphedema, Comorbid History: Congestive Heart Failure, Deep Vein Congestive Heart Failure, Deep Vein Congestive Heart Failure, Deep Vein Thrombosis, Hypertension, Peripheral Thrombosis, Hypertension, Peripheral Thrombosis, Hypertension, Peripheral Venous Disease Venous Disease Venous Disease 12/07/2021 12/07/2021 05/12/2022 Date Acquired: '27 27 14 '$ Weeks of Treatment: Open Open Open Wound Status: No No No Wound Recurrence: 1.2x1x0.1 3.3x4.8x0.7 0.3x0.2x0.3 Measurements L x W x D (cm) 0.942 12.441 0.047 A (cm) : rea 0.094 8.708 0.014 Volume (cm) : 75.20% -132.90% 80.10% % Reduction in Area: 93.80% -307.70% 41.70% % Reduction in Volume: Full Thickness Without Exposed Full Thickness With Exposed Support Full Thickness Without Exposed Classification: Support Structures Structures Support Structures Large Large Medium Exudate Amount: Serous Serosanguineous Purulent Exudate Type: amber red, brown yellow, brown, green Exudate Color: Flat and Intact Distinct, outline attached Epibole Wound Margin: Small (1-33%) Small (1-33%) Small (1-33%) Granulation AmountJAYIN, DEROUSSE ([id number]) [id number] Page 4 of 12 Red, Pink Pink Red Granulation Quality: Large (67-100%) Large (67-100%) None Present (0%) Necrotic Amount: Fat Layer (Subcutaneous Tissue): Yes Fat Layer (Subcutaneous Tissue): Yes Fat Layer (Subcutaneous Tissue): Yes Exposed Structures: Fascia: No Fascia: No Fascia: No Tendon: No Tendon: No Tendon: No Muscle: No Muscle: No Muscle: No Joint: No Joint: No Joint:  No Bone: No Bone: No Bone: No Small (1-33%) None None Epithelialization: Debridement - Selective/Open Wound Debridement - Excisional N/A Debridement: Pre-procedure Verification/Time Out 13:00 13:00 N/A Taken: Lidocaine 5% topical ointment Lidocaine 5% topical ointment N/A Pain Control: Slough Other, Subcutaneous, Slough N/A Tissue Debrided: Non-Viable Tissue Skin/Subcutaneous Tissue N/A Level: 1.2 15.84 N/A Debridement A (sq cm): rea Curette Curette N/A Instrument: Minimum Minimum N/A Bleeding: Pressure Pressure N/A Hemostasis A chieved: 0 0 N/A Procedural Pain: 0 0 N/A Post Procedural Pain: Procedure was tolerated well Procedure was tolerated well N/A Debridement Treatment Response: 1.2x1x0.1 3.3x4.8x0.7 N/A Post Debridement Measurements L x W x D (cm) 0.094 8.708 N/A Post Debridement Volume: (cm) Excoriation: Yes Excoriation: Yes Excoriation: No Periwound Skin Texture: Induration: No Callus: No Crepitus: No Rash: No Scarring: No Maceration: No Maceration: No Maceration: No Periwound Skin Moisture: Dry/Scaly: No Dry/Scaly: No Dry/Scaly: No Erythema: Yes Hemosiderin Staining: Yes Hemosiderin Staining: Yes Periwound Skin Color: Hemosiderin Staining: No Rubor: Yes Atrophie Blanche: No Rubor: No Erythema: No Cyanosis: No Ecchymosis: No Erythema: No Mottled: No Pallor: No Rubor: No Red Streaks N/A N/A Erythema Location: Hot No Abnormality No Abnormality Temperature: Yes Yes N/A Tenderness on Palpation: N/A N/A probes to calcium deposit Assessment Notes: Compression Therapy Compression Therapy N/A  Procedures Performed: Debridement Debridement Treatment Notes Electronic Signature(s) Signed: 08/18/2022 1:19:49 PM By: Fredirick Maudlin MD FACS Entered By: Fredirick Maudlin on 08/18/2022 13:19:49 -------------------------------------------------------------------------------- Multi-Disciplinary Care Plan Details Patient Name: Date of  Service: ASH, MCELWAIN 08/18/2022 12:30 PM Medical Record Number: [id number] Patient Account Number: [id number] Date of Birth/Sex: Treating RN: January 05, 1951 (72 y.o. [name] Primary Care Erie Sica: PA [name], [place] Other Clinician: Referring Ravinder Hofland: Treating Euva Rundell/Extender: [name] in Treatment: [place] reviewed with physician Active Inactive Venous Leg Ulcer Nursing Diagnoses: Knowledge deficit related to disease process and management HURIEL, MATT ([id number]) [id number] Page 5 of 12 Potential for venous Insuffiency (use before diagnosis confirmed) Goals: Patient will maintain optimal edema control Date Initiated: 02/17/2022 Target Resolution Date: 09/01/2022 Goal Status: Active Interventions: Assess peripheral edema status every visit. Compression as ordered Provide education on venous insufficiency Treatment Activities: Non-invasive vascular studies : 02/17/2022 Therapeutic compression applied : 02/17/2022 Notes: Wound/Skin Impairment Nursing Diagnoses: Impaired tissue integrity Knowledge deficit related to ulceration/compromised skin integrity Goals: Patient/caregiver will verbalize understanding of skin care regimen Date Initiated: 02/17/2022 Target Resolution Date: 09/01/2022 Goal Status: Active Ulcer/skin breakdown will have a volume reduction of 30% by week 4 Date Initiated: 02/17/2022 Date Inactivated: 03/10/2022 Target Resolution Date: 03/10/2022 Unmet Reason: infection being treated, Goal Status: Unmet waiting on derm appte Ulcer/skin breakdown will have a volume reduction of 50% by week 8 Date Initiated: 03/10/2022 Date Inactivated: 04/14/2022 Target Resolution Date: 04/07/2022 Goal Status: Unmet Unmet Reason: calcinosis Ulcer/skin breakdown will have a volume reduction of 80% by week 12 Date Initiated: 04/14/2022 Date Inactivated: 05/05/2022 Target Resolution Date:  05/05/2022 Unmet Reason: infection, chronic Goal Status: Unmet condition Interventions: Assess patient/caregiver ability to obtain necessary supplies Assess patient/caregiver ability to perform ulcer/skin care regimen upon admission and as needed Assess ulceration(s) every visit Provide education on ulcer and skin care Treatment Activities: Skin care regimen initiated : 02/17/2022 Topical wound management initiated : 02/17/2022 Notes: Electronic Signature(s) Signed: 08/18/2022 5:16:51 PM By: Baruch Gouty RN, BSN Entered By: Baruch Gouty on 08/18/2022 12:53:19 -------------------------------------------------------------------------------- Pain Assessment Details Patient Name: Date of Service: BYRAN, BILOTTI 08/18/2022 12:30 PM Medical Record Number: [id number] Patient Account Number: [id number] Date of Birth/Sex: Treating RN: 1950/08/05 (72 y.o. [name] Primary Care Easter Schinke: PA [name], [place] Other Clinician: Referring Lindie Roberson: Treating Gryffin Altice/Extender: [name] in Treatment: 27 Active Problems Location of Pain Severity and Description of Pain DAYLN, TUGWELL ([id number]) [id number] Page 6 of 12 Patient Has Paino Yes Site Locations Pain Location: Pain in Ulcers With Dressing Change: Yes Duration of the Pain. Constant / Intermittento Intermittent Rate the pain. Current Pain Level: 1 Worst Pain Level: 5 Least Pain Level: 0 Character of Pain Describe the Pain: Other: sore Pain Management and Medication Current Pain Management: Other: tolerable Is the Current Pain Management Adequate: Adequate How does your wound impact your activities of daily livingo Sleep: No Bathing: No Appetite: No Relationship With Others: No Bladder Continence: No Emotions: No Bowel Continence: No Work: No Toileting: No Drive: No Dressing: No Hobbies: No Engineer, maintenance) Signed: 08/18/2022 5:16:51 PM By: Baruch Gouty  RN, BSN Entered By: Baruch Gouty on 08/18/2022 12:31:42 -------------------------------------------------------------------------------- Patient/Caregiver Education Details Patient Name: Date of Service: [place] 1/24/2024andnbsp12:30 PM Medical Record Number: [id number] Patient Account Number: [id number] Date of Birth/Gender: Treating RN: 02/04/1951 (72 y.o. [name] Primary Care Physician: PA [name], [place] Other Clinician: Referring Physician: Treating Physician/Extender: [name] in Treatment: 27 Education Assessment Education  Provided To: Patient Education Topics Provided Infection: Methods: Explain/Verbal Responses: Reinforcements needed, State content correctly Venous: Methods: Explain/Verbal Responses: Reinforcements needed, State content correctly ABRHAM, MASLOWSKI ([id number]) [id number] Page 7 of 12 Electronic Signature(s) Signed: 08/18/2022 5:16:51 PM By: Baruch Gouty RN, BSN Entered By: Baruch Gouty on 08/18/2022 12:53:55 -------------------------------------------------------------------------------- Wound Assessment Details Patient Name: Date of Service: GIOVANI, NEUMEISTER 08/18/2022 12:30 PM Medical Record Number: [id number] Patient Account Number: [id number] Date of Birth/Sex: Treating RN: Mar 06, 1951 (72 y.o. [name] Primary Care Irelyn Perfecto: PA [name], [place] Other Clinician: Referring Rebekha Diveley: Treating Luciano Cinquemani/Extender: [name] in Treatment: 27 Wound Status Wound Number: 1 Primary Lymphedema Etiology: Wound Location: Left, Dorsal Foot Wound Open Wounding Event: Gradually Appeared Status: Date Acquired: 12/07/2021 Comorbid Cataracts, Anemia, Lymphedema, Congestive Heart Failure, Deep Weeks Of Treatment: 27 History: Vein Thrombosis, Hypertension, Peripheral Venous Disease Clustered Wound: No Photos Wound Measurements Length: (cm) 1.2 Width: (cm) 1 Depth:  (cm) 0.1 Area: (cm) 0.942 Volume: (cm) 0.094 % Reduction in Area: 75.2% % Reduction in Volume: 93.8% Epithelialization: Small (1-33%) Tunneling: No Undermining: No Wound Description Classification: Full Thickness Without Exposed Suppor Wound Margin: Flat and Intact Exudate Amount: Large Exudate Type: Serous Exudate Color: amber t Structures Foul Odor After Cleansing: No Slough/Fibrino Yes Wound Bed Granulation Amount: Small (1-33%) Exposed Structure Granulation Quality: Red, Pink Fascia Exposed: No Necrotic Amount: Large (67-100%) Fat Layer (Subcutaneous Tissue) Exposed: Yes Necrotic Quality: Adherent Slough Tendon Exposed: No Muscle Exposed: No Joint Exposed: No Bone Exposed: No Periwound Skin Texture Texture Color No Abnormalities Noted: No No Abnormalities Noted: No Excoriation: Yes Erythema: Yes Erythema Location: Red Streaks Moisture Hemosiderin Staining: No No Abnormalities Noted: No Rubor: No Dry / Scaly: No DEMPSY, DAMIANO ([id number]) [phone].pdf Page 8 of 12 Maceration: No Temperature / Pain Temperature: Hot Tenderness on Palpation: Yes Treatment Notes Wound #1 (Foot) Wound Laterality: Dorsal, Left Cleanser Peri-Wound Care Triamcinolone 15 (g) Discharge Instruction: Use triamcinolone 15 (g) as directed Sween Lotion (Moisturizing lotion) Discharge Instruction: Apply moisturizing lotion as directed Topical Primary Dressing Promogran Prisma Matrix, 4.34 (sq in) (silver collagen) Discharge Instruction: Moisten collagen with saline or hydrogel Secondary Dressing ABD Pad, 5x9 Discharge Instruction: Apply over primary dressing as directed. Woven Gauze Sponge, Non-Sterile 4x4 in Discharge Instruction: Apply over primary dressing as directed. Secured With Compression Wrap ThreePress (3 layer compression wrap) Discharge Instruction: Apply three layer compression as directed. Compression Stockings Add-Ons Electronic  Signature(s) Signed: 08/18/2022 5:16:51 PM By: Baruch Gouty RN, BSN Entered By: Baruch Gouty on 08/18/2022 12:51:19 -------------------------------------------------------------------------------- Wound Assessment Details Patient Name: Date of Service: CHESTON, COURY 08/18/2022 12:30 PM Medical Record Number: [id number] Patient Account Number: [id number] Date of Birth/Sex: Treating RN: 05/05/1951 (72 y.o. [name] Primary Care Bethania Schlotzhauer: PA [name], [place] Other Clinician: Referring Yailen Zemaitis: Treating Lucina [name]/Extender: Valeria Batman Weeks in Treatment: 27 Wound Status Wound Number: 3 Primary Lymphedema Etiology: Wound Location: Right, Medial Lower Leg Wound Open Wounding Event: Gradually Appeared Status: Date Acquired: 12/07/2021 Comorbid Cataracts, Anemia, Lymphedema, Congestive Heart Failure, Deep Weeks Of Treatment: 27 History: Vein Thrombosis, Hypertension, Peripheral Venous Disease Clustered Wound: No Photos LAKODA, MCANANY ([id number]) [id number] Page 9 of 12 Wound Measurements Length: (cm) 3.3 Width: (cm) 4.8 Depth: (cm) 0.7 Area: (cm) 12.441 Volume: (cm) 8.708 % Reduction in Area: -132.9% % Reduction in Volume: -307.7% Epithelialization: None Tunneling: No Undermining: No Wound Description Classification: Full Thickness With Exposed Suppo Wound Margin: Distinct, outline attached Exudate Amount: Large Exudate Type: Serosanguineous Exudate Color: red, brown rt Structures Foul Odor After  Cleansing: No Slough/Fibrino Yes Wound Bed Granulation Amount: Small (1-33%) Exposed Structure Granulation Quality: Pink Fascia Exposed: No Necrotic Amount: Large (67-100%) Fat Layer (Subcutaneous Tissue) Exposed: Yes Necrotic Quality: Adherent Slough Tendon Exposed: No Muscle Exposed: No Joint Exposed: No Bone Exposed: No Periwound Skin Texture Texture Color No Abnormalities Noted: No No Abnormalities Noted:  No Excoriation: Yes Erythema: No Hemosiderin Staining: Yes Moisture Rubor: Yes No Abnormalities Noted: No Dry / Scaly: No Temperature / Pain Maceration: No Temperature: No Abnormality Tenderness on Palpation: Yes Treatment Notes Wound #3 (Lower Leg) Wound Laterality: Right, Medial Cleanser Peri-Wound Care Triamcinolone 15 (g) Discharge Instruction: Use triamcinolone 15 (g) as directed Zinc Oxide Ointment 30g tube Discharge Instruction: Apply Zinc Oxide to periwound with each dressing change Sween Lotion (Moisturizing lotion) Discharge Instruction: Apply moisturizing lotion as directed Topical Primary Dressing Santyl Ointment Discharge Instruction: Apply nickel thick amount to wound bed as instructed sodium thiosulfate ointment Discharge Instruction: 1:1 mix with santyl to wound bed Secondary Dressing ABD Pad, 8x10 Discharge Instruction: Apply over primary dressing as directed. Woven Gauze Sponge, Non-Sterile 4x4 in Discharge Instruction: Apply over primary dressing as directed. RUPERTO, KIERNAN ([id number]) [id number] Page 10 of 12 Zetuvit Plus 4x8 in Discharge Instruction: Apply over primary dressing as directed. Secured With Compression Wrap ThreePress (3 layer compression wrap) Discharge Instruction: Apply three layer compression as directed. Compression Stockings Add-Ons Electronic Signature(s) Signed: 08/18/2022 5:16:51 PM By: Baruch Gouty RN, BSN Entered By: Baruch Gouty on 08/18/2022 12:52:00 -------------------------------------------------------------------------------- Wound Assessment Details Patient Name: Date of Service: LIONEL, WOODBERRY 08/18/2022 12:30 PM Medical Record Number: [id number] Patient Account Number: [id number] Date of Birth/Sex: Treating RN: 1951/07/25 (72 y.o. [name] Primary Care Carinna Newhart: PA [name], [place] Other Clinician: Referring Dalilah Curlin: Treating Orla Estrin/Extender: [name]  in Treatment: 27 Wound Status Wound Number: 6 Primary Calcinosis Etiology: Wound Location: Right, Anterior Lower Leg Wound Open Wounding Event: Gradually Appeared Status: Date Acquired: 05/12/2022 Comorbid Cataracts, Anemia, Lymphedema, Congestive Heart Failure, Deep Weeks Of Treatment: 14 History: Vein Thrombosis, Hypertension, Peripheral Venous Disease Clustered Wound: No Photos Wound Measurements Length: (cm) 0.3 Width: (cm) 0.2 Depth: (cm) 0.3 Area: (cm) 0.047 Volume: (cm) 0.014 % Reduction in Area: 80.1% % Reduction in Volume: 41.7% Epithelialization: None Tunneling: No Undermining: No Wound Description Classification: Full Thickness Without Exposed Suppor Wound Margin: Epibole Exudate Amount: Medium Exudate Type: Purulent Exudate Color: yellow, brown, green t Structures Foul Odor After Cleansing: No Slough/Fibrino Yes Wound Bed Granulation Amount: Small (1-33%) Exposed Structure Granulation Quality: Red Fascia Exposed: No Necrotic Amount: None Present (0%) Fat Layer (Subcutaneous Tissue) ExposedREISE, GLADNEY ([id number]) [id number] Page 11 of 12 Tendon Exposed: No Muscle Exposed: No Joint Exposed: No Bone Exposed: No Periwound Skin Texture Texture Color No Abnormalities Noted: Yes No Abnormalities Noted: No Atrophie Blanche: No Moisture Cyanosis: No No Abnormalities Noted: Yes Ecchymosis: No Erythema: No Hemosiderin Staining: Yes Mottled: No Pallor: No Rubor: No Temperature / Pain Temperature: No Abnormality Assessment Notes probes to calcium deposit Treatment Notes Wound #6 (Lower Leg) Wound Laterality: Right, Anterior Cleanser Peri-Wound Care Sween Lotion (Moisturizing lotion) Discharge Instruction: Apply moisturizing lotion as directed Topical Primary Dressing Promogran Prisma Matrix, 4.34 (sq in) (silver collagen) Discharge Instruction: Moisten collagen with saline or hydrogel Secondary Dressing ABD  Pad, 8x10 Discharge Instruction: Apply over primary dressing as directed. Woven Gauze Sponge, Non-Sterile 4x4 in Discharge Instruction: Apply over primary dressing as directed. Zetuvit Plus 4x8 in Discharge Instruction: Apply over primary dressing as directed. Secured With Compression Wrap ThreePress (3  layer compression wrap) Discharge Instruction: Apply three layer compression as directed. Compression Stockings Add-Ons Electronic Signature(s) Signed: 08/18/2022 5:16:51 PM By: Baruch Gouty RN, BSN Entered By: Baruch Gouty on 08/18/2022 12:52:47 -------------------------------------------------------------------------------- Vitals Details Patient Name: Date of Service: HAROL, SHABAZZ [name] 08/18/2022 12:30 PM Medical Record Number: [id number] Patient Account Number: [id number] Date of Birth/Sex: Treating RN: Mar 31, 1951 (72 y.o. [name] Primary Care Liara Holm: PA [name], [place] Other Clinician: Referring Davine Coba: Treating Masako Overall/Extender: Valeria Batman Lamar, [name] ([id number]) [id number] Page 12 of 12 Weeks in Treatment: 27 Vital Signs Time Taken: 12:30 Temperature (F): 97.9 Weight (lbs): 267 Pulse (bpm): 65 Respiratory Rate (breaths/min): 18 Blood Pressure (mmHg): 144/77 Reference Range: 80 - 120 mg / dl Electronic Signature(s) Signed: 08/18/2022 5:16:51 PM By: Baruch Gouty RN, BSN Entered By: Baruch Gouty on 08/18/2022 12:30:39

## 2022-08-20 ENCOUNTER — Encounter (HOSPITAL_BASED_OUTPATIENT_CLINIC_OR_DEPARTMENT_OTHER): Payer: Medicare Other | Admitting: General Surgery

## 2022-08-20 DIAGNOSIS — I890 Lymphedema, not elsewhere classified: Secondary | ICD-10-CM | POA: Diagnosis not present

## 2022-08-20 DIAGNOSIS — L942 Calcinosis cutis: Secondary | ICD-10-CM | POA: Diagnosis not present

## 2022-08-20 DIAGNOSIS — L97522 Non-pressure chronic ulcer of other part of left foot with fat layer exposed: Secondary | ICD-10-CM | POA: Diagnosis not present

## 2022-08-20 DIAGNOSIS — I872 Venous insufficiency (chronic) (peripheral): Secondary | ICD-10-CM | POA: Diagnosis not present

## 2022-08-20 DIAGNOSIS — L97812 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d: Secondary | ICD-10-CM | POA: Diagnosis not present

## 2022-08-20 DIAGNOSIS — I251 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: Secondary | ICD-10-CM | POA: Diagnosis not present

## 2022-08-20 NOTE — Progress Notes (Signed)
LAMOUNT, BANKSON ([id number]) [id number] Page 1 of 1 Visit Report for 08/20/2022 SuperBill Details Patient Name: Date of Service: [name], [name] 08/20/2022 Medical Record Number: [id number] Patient Account Number: [id number] Date of Birth/Sex: Treating RN: 1950-11-29 (72 y.o. 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

## 2022-08-20 NOTE — Progress Notes (Signed)
[name], [name] ([id number]) [id number] Page 1 of 7 Visit Report for 08/20/2022 Arrival Information Details Patient Name: Date of Service: [name], [name] 08/20/2022 9:00 [place] Record Number: [id number] Patient Account Number: [id number] Date of Birth/Sex: Treating RN: 09/07/1950 (72 y.o. M) Primary Care Raha Tennison: PA [name], [place] Other Clinician: Referring Noelani Harbach: Treating Tannon Peerson/Extender: [name] in Treatment: 27 Visit Information History Since Last Visit All ordered tests and consults were completed: No Patient Arrived: Wheel Chair Added or deleted any medications: No Arrival Time: 08:52 Any new allergies or adverse reactions: No Accompanied By: self Had a fall or experienced change in No Transfer Assistance: None activities of daily living that may affect Patient Identification Verified: Yes risk of falls: Secondary Verification Process Completed: Yes Signs or symptoms of abuse/neglect since last visito No Patient Requires Transmission-Based Precautions: No Hospitalized since last visit: No Patient Has Alerts: Yes Implantable device outside of the clinic excluding No Patient Alerts: R ABI= N/C, TBI= .75 cellular tissue based products placed in the center L ABI =N/C, TBI = .57 since last visit: Pain Present Now: No Electronic Signature(s) Signed: 08/20/2022 1:35:26 PM By: Worthy Rancher Entered By: Worthy Rancher on 08/20/2022 08:52:58 -------------------------------------------------------------------------------- Compression Therapy Details Patient Name: Date of Service: [name], [name] 08/20/2022 9:00 [place] Record Number: [id number] Patient Account Number: [id number] Date of Birth/Sex: Treating RN: Oct 02, 1950 (72 y.o. [name] Primary Care Vetra Shinall: PA [name], [place] Other Clinician: Referring Keelyn Fjelstad: Treating Jerris Keltz/Extender: [name] in Treatment: 27 Compression Therapy Performed  for Wound Assessment: Wound #1 Left,Dorsal Foot Performed By: Clinician Blanche East, RN Compression Type: Double Layer Electronic Signature(s) Signed: 08/20/2022 10:44:26 AM By: Blanche East RN Entered By: Blanche East on 08/20/2022 10:44:26 -------------------------------------------------------------------------------- Compression Therapy Details Patient Name: Date of Service: [name], [name] 08/20/2022 9:00 A M Medical Record Number: [id number] Patient Account Number: [id number] Date of Birth/Sex: Treating RN: 05-11-1951 (72 y.o. [name] [place], [place] ([id number]) [id number] Page 2 of 7 Primary Care Jaunita Mikels: PA [name], [place] Other Clinician: Referring Maurianna Benard: Treating Maddock Finigan/Extender: [name] in Treatment: 27 Compression Therapy Performed for Wound Assessment: Wound #3 Right,Medial Lower Leg Performed By: Clinician Blanche East, RN Compression Type: Double Layer Electronic Signature(s) Signed: 08/20/2022 10:44:26 AM By: Blanche East RN Entered By: Blanche East on 08/20/2022 10:44:26 -------------------------------------------------------------------------------- Compression Therapy Details Patient Name: Date of Service: [name], [name] 08/20/2022 9:00 A M Medical Record Number: [id number] Patient Account Number: [id number] Date of Birth/Sex: Treating RN: 05-17-1951 (72 y.o. [name] Primary Care Lubna Stegeman: PA [name], [place] Other Clinician: Referring Porshea Janowski: Treating Kobyn Kray/Extender: [name] in Treatment: 27 Compression Therapy Performed for Wound Assessment: Wound #6 Right,Anterior Lower Leg Performed By: Clinician Blanche East, RN Compression Type: Double Layer Electronic Signature(s) Signed: 08/20/2022 10:44:26 AM By: Blanche East RN Entered By: Blanche East on 08/20/2022 10:44:26 -------------------------------------------------------------------------------- Encounter  Discharge Information Details Patient Name: Date of Service: [name] 08/20/2022 9:00 A M Medical Record Number: [id number] Patient Account Number: [id number] Date of Birth/Sex: Treating RN: 03-30-1951 (72 y.o. [name] Primary Care Joaquin Knebel: PA [name], [place] Other Clinician: Referring Satori Krabill: Treating Sanita Estrada/Extender: [name] in Treatment: 27 Encounter Discharge Information Items Discharge Condition: Stable Ambulatory Status: Wheelchair Discharge Destination: Home Transportation: Private Auto Accompanied By: self Schedule Follow-up Appointment: Yes Clinical Summary of Care: Electronic Signature(s) Signed: 08/20/2022 12:03:54 PM By: Blanche East RN Entered By: Blanche East on 08/20/2022 12:03:54 [name] ([id number]) [id number] Page 3  of 7 -------------------------------------------------------------------------------- Patient/Caregiver Education Details Patient Name: Date of Service: [name], [name] 1/26/2024andnbsp9:00 A M Medical Record Number: [id number] Patient Account Number: [id number] Date of Birth/Gender: Treating RN: [birth year] (72 y.o. [name] Primary Care Physician: PA [name], [place] Other Clinician: Referring Physician: Treating Physician/Extender: [name] in Treatment: 27 Education Assessment Education Provided To: Patient Education Topics Provided Wound Debridement: Methods: Explain/Verbal Responses: Reinforcements needed, State content correctly Wound/Skin Impairment: Methods: Explain/Verbal Responses: Reinforcements needed, State content correctly Electronic Signature(s) Signed: 08/20/2022 3:30:08 PM By: Blanche East RN Entered By: Blanche East on 08/20/2022 12:03:40 -------------------------------------------------------------------------------- Wound Assessment Details Patient Name: Date of Service: [name], [name] 08/20/2022 9:00 A M Medical Record  Number: [id number] Patient Account Number: [id number] Date of Birth/Sex: Treating RN: 03-Feb-1951 (72 y.o. [name] Primary Care Yatziry Deakins: PA [name], [place] Other Clinician: Referring Alila Sotero: Treating Madie Cahn/Extender: [name] in Treatment: 27 Wound Status Wound Number: 1 Primary Lymphedema Etiology: Wound Location: Left, Dorsal Foot Wound Open Wounding Event: Gradually Appeared Status: Date Acquired: 12/07/2021 Comorbid Cataracts, Anemia, Lymphedema, Congestive Heart Failure, Deep Weeks Of Treatment: 27 History: Vein Thrombosis, Hypertension, Peripheral Venous Disease Clustered Wound: No Wound Measurements Length: (cm) 1.2 Width: (cm) 1 Depth: (cm) 0.1 Area: (cm) 0.942 Volume: (cm) 0.094 % Reduction in Area: 75.2% % Reduction in Volume: 93.8% Epithelialization: Small (1-33%) Tunneling: No Undermining: No Wound Description Classification: Full Thickness Without Exposed Suppor Wound Margin: Flat and Intact Exudate Amount: Large Exudate Type: Serous Exudate Color: amber t Structures Foul Odor After Cleansing: No Slough/Fibrino Yes Wound Bed Granulation Amount: Small (1-33%) Exposed Structure Granulation Quality: Red, Pink Fascia Exposed: No Necrotic Amount: Large (67-100%) Fat Layer (Subcutaneous Tissue) Exposed: Yes Necrotic Quality: Adherent Slough Tendon Exposed: No SEBASTIN, PERLMUTTER ([id number]) [id number] Page 4 of 7 Muscle Exposed: No Joint Exposed: No Bone Exposed: No Periwound Skin Texture Texture Color No Abnormalities Noted: No No Abnormalities Noted: No Excoriation: Yes Erythema: Yes Erythema Location: Red Streaks Moisture Hemosiderin Staining: No No Abnormalities Noted: No Rubor: No Dry / Scaly: No Maceration: No Temperature / Pain Temperature: Hot Tenderness on Palpation: Yes Treatment Notes Wound #1 (Foot) Wound Laterality: Dorsal, Left Cleanser Peri-Wound Care Triamcinolone 15  (g) Discharge Instruction: Use triamcinolone 15 (g) as directed Sween Lotion (Moisturizing lotion) Discharge Instruction: Apply moisturizing lotion as directed Topical Primary Dressing Promogran Prisma Matrix, 4.34 (sq in) (silver collagen) Discharge Instruction: Moisten collagen with saline or hydrogel Secondary Dressing ABD Pad, 5x9 Discharge Instruction: Apply over primary dressing as directed. Woven Gauze Sponge, Non-Sterile 4x4 in Discharge Instruction: Apply over primary dressing as directed. Secured With Compression Wrap CoFlex TLC Calamine Lite 2-layer Compression System, 3x7 (in/yd) Compression Stockings Add-Ons Electronic Signature(s) Signed: 08/20/2022 10:43:46 AM By: Blanche East RN Entered By: Blanche East on 08/20/2022 10:43:45 -------------------------------------------------------------------------------- Wound Assessment Details Patient Name: Date of Service: [name], [name] 08/20/2022 9:00 A M Medical Record Number: [id number] Patient Account Number: [id number] Date of Birth/Sex: Treating RN: 21-Sep-1950 (72 y.o. [name] Primary Care Lamoine Magallon: PA [name], [place] Other Clinician: Referring Julyana Woolverton: Treating Kahlel Peake/Extender: Valeria Batman Weeks in Treatment: 27 Wound Status Wound Number: 3 Primary Lymphedema Etiology: Wound Location: Right, Medial Lower Leg Wound Open Wounding Event: Gradually Appeared Status: Date Acquired: 12/07/2021 Comorbid Cataracts, Anemia, Lymphedema, Congestive Heart Failure, Deep Weeks Of Treatment: 27 History: Vein Thrombosis, Hypertension, Peripheral Venous Disease [place], Geoge ([id number]) [id number] Page 5 of 7 History: Vein Thrombosis, Hypertension, Peripheral Venous Disease Clustered Wound: No Wound Measurements Length: (cm) 3.3 Width: (cm)  4.8 Depth: (cm) 0.7 Area: (cm) 12.441 Volume: (cm) 8.708 % Reduction in Area: -132.9% % Reduction in Volume:  -307.7% Epithelialization: None Tunneling: No Undermining: No Wound Description Classification: Full Thickness With Exposed Suppo Wound Margin: Distinct, outline attached Exudate Amount: Large Exudate Type: Serosanguineous Exudate Color: red, brown rt Structures Foul Odor After Cleansing: No Slough/Fibrino Yes Wound Bed Granulation Amount: Small (1-33%) Exposed Structure Granulation Quality: Pink Fascia Exposed: No Necrotic Amount: Large (67-100%) Fat Layer (Subcutaneous Tissue) Exposed: Yes Necrotic Quality: Adherent Slough Tendon Exposed: No Muscle Exposed: No Joint Exposed: No Bone Exposed: No Periwound Skin Texture Texture Color No Abnormalities Noted: No No Abnormalities Noted: No Excoriation: Yes Erythema: No Hemosiderin Staining: Yes Moisture Rubor: Yes No Abnormalities Noted: No Dry / Scaly: No Temperature / Pain Maceration: No Temperature: No Abnormality Tenderness on Palpation: Yes Treatment Notes Wound #3 (Lower Leg) Wound Laterality: Right, Medial Cleanser Peri-Wound Care Triamcinolone 15 (g) Discharge Instruction: Use triamcinolone 15 (g) as directed Zinc Oxide Ointment 30g tube Discharge Instruction: Apply Zinc Oxide to periwound with each dressing change Sween Lotion (Moisturizing lotion) Discharge Instruction: Apply moisturizing lotion as directed Topical Primary Dressing Santyl Ointment Discharge Instruction: Apply nickel thick amount to wound bed as instructed sodium thiosulfate ointment Discharge Instruction: 1:1 mix with santyl to wound bed Secondary Dressing ABD Pad, 8x10 Discharge Instruction: Apply over primary dressing as directed. Woven Gauze Sponge, Non-Sterile 4x4 in Discharge Instruction: Apply over primary dressing as directed. Zetuvit Plus 4x8 in Discharge Instruction: Apply over primary dressing as directed. Secured With Compression Wrap CoFlex TLC XL 2-layer Compression System 4x7 (in/yd) Discharge Instruction: Apply  CoFlex 2-layer compression as directed. (alt for 4 layer) Compression Stockings [name], [name] ([id number]) [id number] Page 6 of 7 Add-Ons Electronic Signature(s) Signed: 08/20/2022 10:43:53 AM By: Blanche East RN Entered By: Blanche East on 08/20/2022 10:43:53 -------------------------------------------------------------------------------- Wound Assessment Details Patient Name: Date of Service: [name], [name] 08/20/2022 9:00 A M Medical Record Number: [id number] Patient Account Number: [id number] Date of Birth/Sex: Treating RN: 1951-04-18 (72 y.o. [name] Primary Care Neil Brickell: PA [name], [place] Other Clinician: Referring Javone Ybanez: Treating Gianno Volner/Extender: [name] in Treatment: 27 Wound Status Wound Number: 6 Primary Calcinosis Etiology: Wound Location: Right, Anterior Lower Leg Wound Open Wounding Event: Gradually Appeared Status: Date Acquired: 05/12/2022 Comorbid Cataracts, Anemia, Lymphedema, Congestive Heart Failure, Deep Weeks Of Treatment: 14 History: Vein Thrombosis, Hypertension, Peripheral Venous Disease Clustered Wound: No Wound Measurements Length: (cm) 0.3 Width: (cm) 0.2 Depth: (cm) 0.3 Area: (cm) 0.047 Volume: (cm) 0.014 % Reduction in Area: 80.1% % Reduction in Volume: 41.7% Epithelialization: None Tunneling: No Undermining: No Wound Description Classification: Full Thickness Without Exposed Suppor Wound Margin: Epibole Exudate Amount: Medium Exudate Type: Purulent Exudate Color: yellow, brown, green t Structures Foul Odor After Cleansing: No Slough/Fibrino Yes Wound Bed Granulation Amount: Small (1-33%) Exposed Structure Granulation Quality: Red Fascia Exposed: No Necrotic Amount: None Present (0%) Fat Layer (Subcutaneous Tissue) Exposed: Yes Tendon Exposed: No Muscle Exposed: No Joint Exposed: No Bone Exposed: No Periwound Skin Texture Texture Color No Abnormalities Noted:  Yes No Abnormalities Noted: No Atrophie Blanche: No Moisture Cyanosis: No No Abnormalities Noted: Yes Ecchymosis: No Erythema: No Hemosiderin Staining: Yes Mottled: No Pallor: No Rubor: No Temperature / Pain Temperature: No Abnormality Treatment Notes Wound #6 (Lower Leg) Wound Laterality: Right, Anterior RANI, SISNEY ([id number]) [id number] Page 7 of 7 Peri-Wound Care Sween Lotion (Moisturizing lotion) Discharge Instruction: Apply moisturizing lotion as directed Topical Primary Dressing Promogran Prisma Matrix, 4.34 (sq in) (silver  collagen) Discharge Instruction: Moisten collagen with saline or hydrogel Secondary Dressing ABD Pad, 8x10 Discharge Instruction: Apply over primary dressing as directed. Woven Gauze Sponge, Non-Sterile 4x4 in Discharge Instruction: Apply over primary dressing as directed. Zetuvit Plus 4x8 in Discharge Instruction: Apply over primary dressing as directed. Secured With Compression Wrap CoFlex TLC XL 2-layer Compression System 4x7 (in/yd) Discharge Instruction: Apply CoFlex 2-layer compression as directed. (alt for 4 layer) Compression Stockings Add-Ons Electronic Signature(s) Signed: 08/20/2022 10:44:01 AM By: Blanche East RN Entered By: Blanche East on 08/20/2022 10:44:01 -------------------------------------------------------------------------------- Vitals Details Patient Name: Date of Service: [name] 08/20/2022 9:00 A M Medical Record Number: [id number] Patient Account Number: [id number] Date of Birth/Sex: Treating RN: May 13, 1951 (72 y.o. M) Primary Care Almon Whitford: PA [name], [place] Other Clinician: Referring Avanelle Pixley: Treating Markee Matera/Extender: [name] in Treatment: 27 Vital Signs Time Taken: 08:53 Temperature (F): 97.6 Height (in): 71 Pulse (bpm): 73 Weight (lbs): 267 Respiratory Rate (breaths/min): 20 Body Mass Index (BMI): 37.2 Blood Pressure (mmHg):  144/82 Reference Range: 80 - 120 mg / dl Electronic Signature(s) Signed: 08/20/2022 1:35:26 PM By: Worthy Rancher Entered By: Worthy Rancher on 08/20/2022 08:53:32

## 2022-08-24 ENCOUNTER — Ambulatory Visit (HOSPITAL_BASED_OUTPATIENT_CLINIC_OR_DEPARTMENT_OTHER): Payer: Medicare Other | Admitting: General Surgery

## 2022-08-25 ENCOUNTER — Encounter (HOSPITAL_BASED_OUTPATIENT_CLINIC_OR_DEPARTMENT_OTHER): Payer: Medicare Other | Admitting: General Surgery

## 2022-08-25 DIAGNOSIS — I890 Lymphedema, not elsewhere classified: Secondary | ICD-10-CM | POA: Diagnosis not present

## 2022-08-25 DIAGNOSIS — I251 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: Secondary | ICD-10-CM | POA: Diagnosis not present

## 2022-08-25 DIAGNOSIS — L97812 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d: Secondary | ICD-10-CM | POA: Diagnosis not present

## 2022-08-25 DIAGNOSIS — L97522 Non-pressure chronic ulcer of other part of left foot with fat layer exposed: Secondary | ICD-10-CM | POA: Diagnosis not present

## 2022-08-25 DIAGNOSIS — L942 Calcinosis cutis: Secondary | ICD-10-CM | POA: Diagnosis not present

## 2022-08-25 DIAGNOSIS — I872 Venous insufficiency (chronic) (peripheral): Secondary | ICD-10-CM | POA: Diagnosis not present

## 2022-08-26 DIAGNOSIS — L814 Other melanin hyperpigmentation: Secondary | ICD-10-CM | POA: Diagnosis not present

## 2022-08-26 DIAGNOSIS — D3617 Benign neoplasm of peripheral nerves and autonomic nervous system of trunk, unspecified: Secondary | ICD-10-CM | POA: Diagnosis not present

## 2022-08-26 DIAGNOSIS — L570 Actinic keratosis: Secondary | ICD-10-CM | POA: Diagnosis not present

## 2022-08-26 DIAGNOSIS — L821 Other seborrheic keratosis: Secondary | ICD-10-CM | POA: Diagnosis not present

## 2022-08-26 DIAGNOSIS — D692 Other nonthrombocytopenic purpura: Secondary | ICD-10-CM | POA: Diagnosis not present

## 2022-08-26 NOTE — Progress Notes (Signed)
TSUGIO, ELISON ([id number]) [id number] Page 1 of 11 Visit Report for 08/25/2022 Arrival Information Details Patient Name: Date of Service: SHAMUS, DESANTIS 08/25/2022 12:30 PM Medical Record Number: [id number] Patient Account Number: [id number] Date of Birth/Sex: Treating RN: 02-Jul-1951 (72 y.o. [name] Primary Care Trea Latner: PA [name], [place] Other Clinician: Referring Jaydon Avina: Treating Devann Cribb/Extender: [name] in Treatment: 28 Visit Information History Since Last Visit Added or deleted any medications: No Patient Arrived: Wheel Chair Any new allergies or adverse reactions: No Arrival Time: 12:33 Had a fall or experienced change in No Accompanied By: self activities of daily living that may affect Transfer Assistance: None risk of falls: Patient Identification Verified: Yes Signs or symptoms of abuse/neglect since last visito No Secondary Verification Process Completed: Yes Hospitalized since last visit: No Patient Requires Transmission-Based Precautions: No Implantable device outside of the clinic excluding No Patient Has Alerts: Yes cellular tissue based products placed in the center Patient Alerts: R ABI= N/C, TBI= .75 since last visit: L ABI =N/C, TBI = .57 Has Dressing in Place as Prescribed: Yes Has Compression in Place as Prescribed: Yes Pain Present Now: No Electronic Signature(s) Signed: 08/25/2022 6:15:21 PM By: Baruch Gouty RN, BSN Entered By: Baruch Gouty on 08/25/2022 12:36:22 -------------------------------------------------------------------------------- Compression Therapy Details Patient Name: Date of Service: RAMZY, CAPPELLETTI 08/25/2022 12:30 PM Medical Record Number: [id number] Patient Account Number: [id number] Date of Birth/Sex: Treating RN: 1951/07/20 (72 y.o. [name] Primary Care Joeli Fenner: PA [name], [place] Other Clinician: Referring Rawlins Stuard: Treating Verdine Grenfell/Extender: [name] in Treatment: 28 Compression Therapy Performed for Wound Assessment: Wound #1 Left,Dorsal Foot Performed By: Clinician Baruch Gouty, RN Compression Type: Three Layer Post Procedure Diagnosis Same as Pre-procedure Electronic Signature(s) Signed: 08/25/2022 6:15:21 PM By: Baruch Gouty RN, BSN Entered By: Baruch Gouty on 08/25/2022 13:05:20 [name] ([id number]) [id number] Page 2 of 11 -------------------------------------------------------------------------------- Compression Therapy Details Patient Name: Date of Service: DELMER, KOWALSKI 08/25/2022 12:30 PM Medical Record Number: [id number] Patient Account Number: [id number] Date of Birth/Sex: Treating RN: Apr 13, 1951 (72 y.o. [name] Primary Care Bernestine Holsapple: PA [name], [place] Other Clinician: Referring Maloni Musleh: Treating Kaytlynne Neace/Extender: [name] in Treatment: 28 Compression Therapy Performed for Wound Assessment: Wound #3 Right,Medial Lower Leg Performed By: Clinician Baruch Gouty, RN Compression Type: Three Layer Post Procedure Diagnosis Same as Pre-procedure Electronic Signature(s) Signed: 08/25/2022 6:15:21 PM By: Baruch Gouty RN, BSN Entered By: Baruch Gouty on 08/25/2022 13:05:20 -------------------------------------------------------------------------------- Encounter Discharge Information Details Patient Name: Date of Service: DAVONN, FLANERY 08/25/2022 12:30 PM Medical Record Number: [id number] Patient Account Number: [id number] Date of Birth/Sex: Treating RN: 09/28/1950 (72 y.o. [name] Primary Care Dublin Grayer: PA [name], [place] Other Clinician: Referring Aymen Widrig: Treating Braileigh Landenberger/Extender: [name] in Treatment: 28 Encounter Discharge Information Items Post Procedure Vitals Discharge Condition: Stable Temperature (F): 97.9 Ambulatory Status: Wheelchair Pulse (bpm): 62 Discharge  Destination: Home Respiratory Rate (breaths/min): 18 Transportation: Private Auto Blood Pressure (mmHg): 165/83 Accompanied By: self Schedule Follow-up Appointment: Yes Clinical Summary of Care: Patient Declined Electronic Signature(s) Signed: 08/25/2022 6:15:21 PM By: Baruch Gouty RN, BSN Entered By: Baruch Gouty on 08/25/2022 13:35:29 -------------------------------------------------------------------------------- Lower Extremity Assessment Details Patient Name: Date of Service: JAYLEON, MCFARLANE 08/25/2022 12:30 PM Medical Record Number: [id number] Patient Account Number: [id number] Date of Birth/Sex: Treating RN: [birth year] (72 y.o. [name] Primary Care Conor Filsaime: PA [name], [place] Other Clinician: Referring Kielyn Kardell: Treating Manna Gose/Extender: Valeria Batman Weeks in Treatment: 28 Edema Assessment Assessed: [Left:  No] [Right: No] Edema: [Left: Yes] [Right: Yes] Calf ESAUL, DORWART ([id number]) [id number] Page 3 of 11 Left: Right: Point of Measurement: From Medial Instep 44 cm 44.5 cm Ankle Left: Right: Point of Measurement: From Medial Instep 28 cm 28.5 cm Vascular Assessment Pulses: Dorsalis Pedis Palpable: [Left:No] [Right:No] Electronic Signature(s) Signed: 08/25/2022 6:15:21 PM By: Baruch Gouty RN, BSN Entered By: Baruch Gouty on 08/25/2022 12:44:08 -------------------------------------------------------------------------------- Multi Wound Chart Details Patient Name: Date of Service: [name] 08/25/2022 12:30 PM Medical Record Number: [id number] Patient Account Number: [id number] Date of Birth/Sex: Treating RN: 1951-02-07 (72 y.o. M) Primary Care Johnathen Testa: PA [name], [place] Other Clinician: Referring Vivaan Helseth: Treating Cutberto Winfree/Extender: [name], [name] in Treatment: 28 Vital Signs Height(in): 71 Pulse(bpm): 21 Weight(lbs): 267 Blood Pressure(mmHg): 165/83 Body Mass Index(BMI):  37.2 Temperature(F): 97.9 Respiratory Rate(breaths/min): 20 [1:Photos:] Left, Dorsal Foot Right, Medial Lower Leg Right, Anterior Lower Leg Wound Location: Gradually Appeared Gradually Appeared Gradually Appeared Wounding Event: Lymphedema Lymphedema Calcinosis Primary Etiology: Cataracts, Anemia, Lymphedema, Cataracts, Anemia, Lymphedema, Cataracts, Anemia, Lymphedema, Comorbid History: Congestive Heart Failure, Deep Vein Congestive Heart Failure, Deep Vein Congestive Heart Failure, Deep Vein Thrombosis, Hypertension, Peripheral Thrombosis, Hypertension, Peripheral Thrombosis, Hypertension, Peripheral Venous Disease Venous Disease Venous Disease 12/07/2021 12/07/2021 05/12/2022 Date Acquired: '28 28 15 '$ Weeks of Treatment: Open Open Open Wound Status: No No No Wound Recurrence: 1.4x1.2x0.1 3.1x6.3x0.9 0.3x0.2x0.2 Measurements L x W x D (cm) 1.319 15.339 0.047 A (cm) : rea 0.132 13.805 0.009 Volume (cm) : 65.20% -187.20% 80.10% % Reduction in Area: 91.30% -546.30% 62.50% % Reduction in Volume: Full Thickness Without Exposed Full Thickness With Exposed Support Full Thickness Without Exposed Classification: Support Structures Structures Support Structures Medium Large Medium Exudate Amount: Serous Serosanguineous Purulent Exudate Type: amber red, brown yellow, brown, green Exudate Color: Flat and Intact Distinct, outline attached Epibole Wound Margin: Medium (34-66%) Small (1-33%) Small (1-33%) Granulation AmountFRIEDRICH, HARRIOTT ([id number]) [id number] Page 4 of 11 Red, Pink Red, Pink Red Granulation Quality: Medium (34-66%) Large (67-100%) None Present (0%) Necrotic Amount: Fat Layer (Subcutaneous Tissue): Yes Fat Layer (Subcutaneous Tissue): Yes Fat Layer (Subcutaneous Tissue): Yes Exposed Structures: Fascia: No Fascia: No Fascia: No Tendon: No Tendon: No Tendon: No Muscle: No Muscle: No Muscle: No Joint: No Joint: No Joint:  No Bone: No Bone: No Bone: No Small (1-33%) None None Epithelialization: Debridement - Excisional Debridement - Excisional N/A Debridement: Pre-procedure Verification/Time Out 13:05 13:05 N/A Taken: Lidocaine 4% Topical Solution Lidocaine 4% Topical Solution N/A Pain Control: Subcutaneous, Slough Subcutaneous, Slough N/A Tissue Debrided: Skin/Subcutaneous Tissue Skin/Subcutaneous Tissue N/A Level: 1.68 15.5 N/A Debridement A (sq cm): rea Curette Curette N/A Instrument: Minimum Minimum N/A Bleeding: Pressure Pressure N/A Hemostasis A chieved: 0 3 N/A Procedural Pain: 0 1 N/A Post Procedural Pain: Procedure was tolerated well Procedure was tolerated well N/A Debridement Treatment Response: 1.4x1.2x0.1 3.1x6.3x0.9 N/A Post Debridement Measurements L x W x D (cm) 0.132 13.805 N/A Post Debridement Volume: (cm) Excoriation: No Excoriation: Yes Excoriation: No Periwound Skin Texture: Induration: No Callus: No Crepitus: No Rash: No Scarring: No Maceration: No Maceration: No Maceration: No Periwound Skin Moisture: Dry/Scaly: No Dry/Scaly: No Dry/Scaly: No Hemosiderin Staining: Yes Hemosiderin Staining: Yes Hemosiderin Staining: Yes Periwound Skin Color: Erythema: No Rubor: Yes Atrophie Blanche: No Rubor: No Erythema: No Cyanosis: No Ecchymosis: No Erythema: No Mottled: No Pallor: No Rubor: No Hot No Abnormality No Abnormality Temperature: Yes Yes N/A Tenderness on Palpation: Compression Therapy Compression Therapy N/A Procedures Performed: Debridement Debridement Treatment Notes Electronic Signature(s) Signed: 08/25/2022 1:15:26 PM  By: Fredirick Maudlin MD FACS Previous Signature: 08/25/2022 1:13:15 PM Version By: Fredirick Maudlin MD FACS Entered By: Fredirick Maudlin on 08/25/2022 13:15:26 -------------------------------------------------------------------------------- Multi-Disciplinary Care Plan Details Patient Name: Date of Service: WYNTON, HUFSTETLER 08/25/2022 12:30 PM Medical Record Number: [id number] Patient Account Number: [id number] Date of Birth/Sex: Treating RN: 01/28/1951 (72 y.o. [name] Primary Care Chasitie Passey: PA [name], [place] Other Clinician: Referring Emilio Baylock: Treating Jamichael Knotts/Extender: [name] in Treatment: [place] reviewed with physician Active Inactive Venous Leg Ulcer Nursing Diagnoses: Knowledge deficit related to disease process and management Potential for venous Insuffiency (use before diagnosis confirmed) RANDEL, HARGENS ([id number]) [id number].pdf Page 5 of 11 Goals: Patient will maintain optimal edema control Date Initiated: 02/17/2022 Target Resolution Date: 09/01/2022 Goal Status: Active Interventions: Assess peripheral edema status every visit. Compression as ordered Provide education on venous insufficiency Treatment Activities: Non-invasive vascular studies : 02/17/2022 Therapeutic compression applied : 02/17/2022 Notes: Wound/Skin Impairment Nursing Diagnoses: Impaired tissue integrity Knowledge deficit related to ulceration/compromised skin integrity Goals: Patient/caregiver will verbalize understanding of skin care regimen Date Initiated: 02/17/2022 Target Resolution Date: 09/01/2022 Goal Status: Active Ulcer/skin breakdown will have a volume reduction of 30% by week 4 Date Initiated: 02/17/2022 Date Inactivated: 03/10/2022 Target Resolution Date: 03/10/2022 Unmet Reason: infection being treated, Goal Status: Unmet waiting on derm appte Ulcer/skin breakdown will have a volume reduction of 50% by week 8 Date Initiated: 03/10/2022 Date Inactivated: 04/14/2022 Target Resolution Date: 04/07/2022 Goal Status: Unmet Unmet Reason: calcinosis Ulcer/skin breakdown will have a volume reduction of 80% by week 12 Date Initiated: 04/14/2022 Date Inactivated: 05/05/2022 Target Resolution Date: 05/05/2022 Unmet Reason: infection,  chronic Goal Status: Unmet condition Interventions: Assess patient/caregiver ability to obtain necessary supplies Assess patient/caregiver ability to perform ulcer/skin care regimen upon admission and as needed Assess ulceration(s) every visit Provide education on ulcer and skin care Treatment Activities: Skin care regimen initiated : 02/17/2022 Topical wound management initiated : 02/17/2022 Notes: Electronic Signature(s) Signed: 08/25/2022 6:15:21 PM By: Baruch Gouty RN, BSN Entered By: Baruch Gouty on 08/25/2022 12:58:47 -------------------------------------------------------------------------------- Pain Assessment Details Patient Name: Date of Service: JOSHAWN, CRISSMAN 08/25/2022 12:30 PM Medical Record Number: [id number] Patient Account Number: [id number] Date of Birth/Sex: Treating RN: [birth year] (72 y.o. [name] Primary Care Taner Rzepka: PA [name], [place] Other Clinician: Referring [place] Mielke: Treating Rubie Ficco/Extender: [name] in Treatment: 28 Active Problems Location of Pain Severity and Description of Pain Patient Has Paino No DEADRIAN, TOYA ([id number]) [id number] Page 6 of 11 Patient Has Paino No Site Locations Rate the pain. Current Pain Level: 0 Pain Management and Medication Current Pain Management: Electronic Signature(s) Signed: 08/25/2022 6:15:21 PM By: Baruch Gouty RN, BSN Entered By: Baruch Gouty on 08/25/2022 12:37:56 -------------------------------------------------------------------------------- Patient/Caregiver Education Details Patient Name: Date of Service: NEWTON, FRUTIGER 1/31/2024andnbsp12:30 PM Medical Record Number: [id number] Patient Account Number: [id number] Date of Birth/Gender: Treating RN: 1951-07-14 (72 y.o. [name] Primary Care Physician: PA [name], [place] Other Clinician: Referring Physician: Treating Physician/Extender: [name] in Treatment:  28 Education Assessment Education Provided To: Patient Education Topics Provided Venous: Methods: Explain/Verbal Responses: Reinforcements needed, State content correctly Wound/Skin Impairment: Methods: Explain/Verbal Responses: Reinforcements needed, State content correctly Electronic Signature(s) Signed: 08/25/2022 6:15:21 PM By: Baruch Gouty RN, BSN Entered By: Baruch Gouty on 08/25/2022 12:59:11 [name] ([id number]) [id number] Page 7 of 11 -------------------------------------------------------------------------------- Wound Assessment Details Patient Name: Date of Service: SHRAGA, CUSTARD 08/25/2022 12:30 PM Medical Record Number: [id number] Patient Account Number: [id number] Date of Birth/Sex:  Treating RN: 1951-04-01 (72 y.o. [name] Primary Care Brandon Scarbrough: PA [name], [place] Other Clinician: Referring Abdon Petrosky: Treating Dudley Mages/Extender: [name] in Treatment: 28 Wound Status Wound Number: 1 Primary Lymphedema Etiology: Wound Location: Left, Dorsal Foot Wound Open Wounding Event: Gradually Appeared Status: Date Acquired: 12/07/2021 Comorbid Cataracts, Anemia, Lymphedema, Congestive Heart Failure, Deep Weeks Of Treatment: 28 History: Vein Thrombosis, Hypertension, Peripheral Venous Disease Clustered Wound: No Photos Wound Measurements Length: (cm) 1.4 Width: (cm) 1.2 Depth: (cm) 0.1 Area: (cm) 1.319 Volume: (cm) 0.132 % Reduction in Area: 65.2% % Reduction in Volume: 91.3% Epithelialization: Small (1-33%) Tunneling: No Undermining: No Wound Description Classification: Full Thickness Without Exposed Suppor Wound Margin: Flat and Intact Exudate Amount: Medium Exudate Type: Serous Exudate Color: amber t Structures Foul Odor After Cleansing: No Slough/Fibrino Yes Wound Bed Granulation Amount: Medium (34-66%) Exposed Structure Granulation Quality: Red, Pink Fascia Exposed: No Necrotic  Amount: Medium (34-66%) Fat Layer (Subcutaneous Tissue) Exposed: Yes Necrotic Quality: Adherent Slough Tendon Exposed: No Muscle Exposed: No Joint Exposed: No Bone Exposed: No Periwound Skin Texture Texture Color No Abnormalities Noted: Yes No Abnormalities Noted: No Erythema: No Moisture Hemosiderin Staining: Yes No Abnormalities Noted: No Rubor: No Dry / Scaly: No Maceration: No Temperature / Pain Temperature: Hot Tenderness on Palpation: Yes Treatment Notes Wound #1 (Foot) Wound Laterality: Dorsal, Left Cleanser Peri-Wound Care Triamcinolone 15 (g) Discharge Instruction: Use triamcinolone 15 (g) as directed [name] ([id number]) [id number] Page 8 of 11 Sween Lotion (Moisturizing lotion) Discharge Instruction: Apply moisturizing lotion as directed Topical Primary Dressing Promogran Prisma Matrix, 4.34 (sq in) (silver collagen) Discharge Instruction: Moisten collagen with saline or hydrogel Secondary Dressing ABD Pad, 5x9 Discharge Instruction: Apply over primary dressing as directed. Woven Gauze Sponge, Non-Sterile 4x4 in Discharge Instruction: Apply over primary dressing as directed. Secured With Compression Wrap ThreePress (3 layer compression wrap) Discharge Instruction: Apply three layer compression as directed. Compression Stockings Add-Ons Electronic Signature(s) Signed: 08/25/2022 6:15:21 PM By: Baruch Gouty RN, BSN Entered By: Baruch Gouty on 08/25/2022 12:55:40 -------------------------------------------------------------------------------- Wound Assessment Details Patient Name: Date of Service: CHEYENNE, SCHUMM 08/25/2022 12:30 PM Medical Record Number: [id number] Patient Account Number: [id number] Date of Birth/Sex: Treating RN: 05/17/1951 (72 y.o. [name] Primary Care Tocarra Gassen: PA [name], [place] Other Clinician: Referring Gordie Belvin: Treating Sheryn Aldaz/Extender: [name] in Treatment:  28 Wound Status Wound Number: 3 Primary Lymphedema Etiology: Wound Location: Right, Medial Lower Leg Wound Open Wounding Event: Gradually Appeared Status: Date Acquired: 12/07/2021 Comorbid Cataracts, Anemia, Lymphedema, Congestive Heart Failure, Deep Weeks Of Treatment: 28 History: Vein Thrombosis, Hypertension, Peripheral Venous Disease Clustered Wound: No Photos Wound Measurements Length: (cm) 3.1 Width: (cm) 6.3 Depth: (cm) 0.9 Area: (cm) 15.339 Volume: (cm) 13.805 Micale, Claris ([id number]) % Reduction in Area: -187.2% % Reduction in Volume: -546.3% Epithelialization: None Tunneling: No Undermining: No [phone].pdf Page 9 of 11 Wound Description Classification: Full Thickness With Exposed Suppo Wound Margin: Distinct, outline attached Exudate Amount: Large Exudate Type: Serosanguineous Exudate Color: red, brown rt Structures Foul Odor After Cleansing: No Slough/Fibrino Yes Wound Bed Granulation Amount: Small (1-33%) Exposed Structure Granulation Quality: Red, Pink Fascia Exposed: No Necrotic Amount: Large (67-100%) Fat Layer (Subcutaneous Tissue) Exposed: Yes Necrotic Quality: Adherent Slough Tendon Exposed: No Muscle Exposed: No Joint Exposed: No Bone Exposed: No Periwound Skin Texture Texture Color No Abnormalities Noted: No No Abnormalities Noted: No Excoriation: Yes Erythema: No Hemosiderin Staining: Yes Moisture Rubor: Yes No Abnormalities Noted: No Dry / Scaly: No Temperature / Pain Maceration: No Temperature:  No Abnormality Tenderness on Palpation: Yes Treatment Notes Wound #3 (Lower Leg) Wound Laterality: Right, Medial Cleanser Peri-Wound Care Triamcinolone 15 (g) Discharge Instruction: Use triamcinolone 15 (g) as directed Zinc Oxide Ointment 30g tube Discharge Instruction: Apply Zinc Oxide to periwound with each dressing change Sween Lotion (Moisturizing lotion) Discharge Instruction: Apply moisturizing lotion  as directed Topical Primary Dressing Santyl Ointment Discharge Instruction: Apply nickel thick amount to wound bed as instructed sodium thiosulfate ointment Discharge Instruction: 1:1 mix with santyl to wound bed Secondary Dressing ABD Pad, 8x10 Discharge Instruction: Apply over primary dressing as directed. Woven Gauze Sponge, Non-Sterile 4x4 in Discharge Instruction: Apply over primary dressing as directed. Zetuvit Plus 4x8 in Discharge Instruction: Apply over primary dressing as directed. Secured With Compression Wrap ThreePress (3 layer compression wrap) Discharge Instruction: Apply three layer compression as directed. Compression Stockings Add-Ons Electronic Signature(s) Signed: 08/25/2022 6:15:21 PM By: Baruch Gouty RN, BSN Entered By: Baruch Gouty on 08/25/2022 12:56:41 [name] ([id number]) [id number] Page 10 of 11 -------------------------------------------------------------------------------- Wound Assessment Details Patient Name: Date of Service: CHADD, TOLLISON 08/25/2022 12:30 PM Medical Record Number: [id number] Patient Account Number: [id number] Date of Birth/Sex: Treating RN: 03/05/1951 (72 y.o. [name] Primary Care Eisa Necaise: PA [name], [place] Other Clinician: Referring Trystin Terhune: Treating Shamiah Kahler/Extender: [name] in Treatment: 28 Wound Status Wound Number: 6 Primary Calcinosis Etiology: Wound Location: Right, Anterior Lower Leg Wound Open Wounding Event: Gradually Appeared Status: Date Acquired: 05/12/2022 Comorbid Cataracts, Anemia, Lymphedema, Congestive Heart Failure, Deep Weeks Of Treatment: 15 History: Vein Thrombosis, Hypertension, Peripheral Venous Disease Clustered Wound: No Photos Wound Measurements Length: (cm) 0.3 Width: (cm) 0.2 Depth: (cm) 0.2 Area: (cm) 0.047 Volume: (cm) 0.009 % Reduction in Area: 80.1% % Reduction in Volume: 62.5% Epithelialization:  None Tunneling: No Undermining: No Wound Description Classification: Full Thickness Without Exposed Suppor Wound Margin: Epibole Exudate Amount: Medium Exudate Type: Purulent Exudate Color: yellow, brown, green t Structures Foul Odor After Cleansing: No Slough/Fibrino Yes Wound Bed Granulation Amount: Small (1-33%) Exposed Structure Granulation Quality: Red Fascia Exposed: No Necrotic Amount: None Present (0%) Fat Layer (Subcutaneous Tissue) Exposed: Yes Tendon Exposed: No Muscle Exposed: No Joint Exposed: No Bone Exposed: No Periwound Skin Texture Texture Color No Abnormalities Noted: Yes No Abnormalities Noted: No Atrophie Blanche: No Moisture Cyanosis: No No Abnormalities Noted: Yes Ecchymosis: No Erythema: No Hemosiderin Staining: Yes Mottled: No Pallor: No Rubor: No Temperature / Pain Temperature: No Abnormality Sacca, Drae ([id number]) [id number] Page 11 of 11 Treatment Notes Wound #6 (Lower Leg) Wound Laterality: Right, Anterior Cleanser Peri-Wound Care Sween Lotion (Moisturizing lotion) Discharge Instruction: Apply moisturizing lotion as directed Topical Primary Dressing Promogran Prisma Matrix, 4.34 (sq in) (silver collagen) Discharge Instruction: Moisten collagen with saline or hydrogel Secondary Dressing ABD Pad, 8x10 Discharge Instruction: Apply over primary dressing as directed. Woven Gauze Sponge, Non-Sterile 4x4 in Discharge Instruction: Apply over primary dressing as directed. Zetuvit Plus 4x8 in Discharge Instruction: Apply over primary dressing as directed. Secured With Compression Wrap ThreePress (3 layer compression wrap) Discharge Instruction: Apply three layer compression as directed. Compression Stockings Add-Ons Electronic Signature(s) Signed: 08/25/2022 6:15:21 PM By: Baruch Gouty RN, BSN Entered By: Baruch Gouty on 08/25/2022  12:57:10 -------------------------------------------------------------------------------- Vitals Details Patient Name: Date of Service: IVEY, CINA [name] 08/25/2022 12:30 PM Medical Record Number: [id number] Patient Account Number: [id number] Date of Birth/Sex: Treating RN: 1950/12/24 (72 y.o. [name] Primary Care Lyncoln Maskell: PA [name], [place] Other Clinician: Referring Evelyna Folker: Treating Adrean Heitz/Extender: [name] in Treatment: 28 Vital Signs  Time Taken: 12:36 Temperature (F): 97.9 Height (in): 71 Pulse (bpm): 62 Weight (lbs): 267 Respiratory Rate (breaths/min): 20 Body Mass Index (BMI): 37.2 Blood Pressure (mmHg): 165/83 Reference Range: 80 - 120 mg / dl Electronic Signature(s) Signed: 08/25/2022 6:15:21 PM By: Baruch Gouty RN, BSN Entered By: Baruch Gouty on 08/25/2022 12:36:41

## 2022-08-26 NOTE — Progress Notes (Signed)
GWIN, EAGON ([id number]) [id number] Page 1 of 15 Visit Report for 08/25/2022 Chief Complaint Document Details Patient Name: Date of Service: [name], [name] 08/25/2022 12:30 PM Medical Record Number: [id number] Patient Account Number: [id number] Date of Birth/Sex: Treating RN: Sep 06, 1950 (72 y.o. M) Primary Care Provider: PA [name], NO Other Clinician: Referring Provider: Treating Provider/Extender: [name] in Treatment: 28 Information Obtained from: Patient Chief Complaint Patient seen for complaints of Non-Healing Wounds. Electronic Signature(s) Signed: 08/25/2022 1:13:23 PM By: Fredirick Maudlin MD FACS Entered By: Fredirick Maudlin on 08/25/2022 13:13:23 -------------------------------------------------------------------------------- Debridement Details Patient Name: Date of Service: [name], [name] 08/25/2022 12:30 PM Medical Record Number: [id number] Patient Account Number: [id number] Date of Birth/Sex: Treating RN: June 19, 1951 (72 y.o. [name] Primary Care Provider: PA [name], [place] Other Clinician: Referring Provider: Treating Provider/Extender: [name] in Treatment: 28 Debridement Performed for Assessment: Wound #1 Left,Dorsal Foot Performed By: Physician Fredirick Maudlin, MD Debridement Type: Debridement Level of Consciousness (Pre-procedure): Awake and Alert Pre-procedure Verification/Time Out Yes - 13:05 Taken: Start Time: 13:06 Pain Control: Lidocaine 4% T opical Solution T Area Debrided (L x W): otal 1.4 (cm) x 1.2 (cm) = 1.68 (cm) Tissue and other material debrided: Viable, Non-Viable, Slough, Subcutaneous, Slough Level: Skin/Subcutaneous Tissue Debridement Description: Excisional Instrument: Curette Bleeding: Minimum Hemostasis Achieved: Pressure Procedural Pain: 0 Post Procedural Pain: 0 Response to Treatment: Procedure was tolerated well Level of Consciousness (Post- Awake  and Alert procedure): Post Debridement Measurements of Total Wound Length: (cm) 1.4 Width: (cm) 1.2 Depth: (cm) 0.1 Volume: (cm) 0.132 Character of Wound/Ulcer Post Debridement: Improved Post Procedure Diagnosis Same as [name] ([id number]) [phone] Page 2 of 15 Notes Scribed for Dr. [name] by Baruch Gouty, RN Electronic Signature(s) Signed: 08/25/2022 2:55:26 PM By: Fredirick Maudlin MD FACS Signed: 08/25/2022 6:15:21 PM By: Baruch Gouty RN, BSN Entered By: Baruch Gouty on 08/25/2022 13:07:33 -------------------------------------------------------------------------------- Debridement Details Patient Name: Date of Service: [name], [name] 08/25/2022 12:30 PM Medical Record Number: [id number] Patient Account Number: [id number] Date of Birth/Sex: Treating RN: 04-28-1951 (72 y.o. [name] Primary Care Provider: PA [name], [place] Other Clinician: Referring Provider: Treating Provider/Extender: [name] in Treatment: 28 Debridement Performed for Assessment: Wound #3 Right,Medial Lower Leg Performed By: Physician Fredirick Maudlin, MD Debridement Type: Debridement Level of Consciousness (Pre-procedure): Awake and Alert Pre-procedure Verification/Time Out Yes - 13:05 Taken: Start Time: 13:06 Pain Control: Lidocaine 4% T opical Solution T Area Debrided (L x W): otal 3.1 (cm) x 5 (cm) = 15.5 (cm) Tissue and other material debrided: Viable, Non-Viable, Slough, Subcutaneous, Slough Level: Skin/Subcutaneous Tissue Debridement Description: Excisional Instrument: Curette Bleeding: Minimum Hemostasis Achieved: Pressure Procedural Pain: 3 Post Procedural Pain: 1 Response to Treatment: Procedure was tolerated well Level of Consciousness (Post- Awake and Alert procedure): Post Debridement Measurements of Total Wound Length: (cm) 3.1 Width: (cm) 6.3 Depth: (cm) 0.9 Volume: (cm) 13.805 Character of  Wound/Ulcer Post Debridement: Improved Post Procedure Diagnosis Same as Pre-procedure Notes Scribed for Dr. [name] by Baruch Gouty, RN Electronic Signature(s) Signed: 08/25/2022 2:55:26 PM By: Fredirick Maudlin MD FACS Signed: 08/25/2022 6:15:21 PM By: Baruch Gouty RN, BSN Entered By: Baruch Gouty on 08/25/2022 13:08:46 HPI Details -------------------------------------------------------------------------------- [name] ([id number]) [id number] Page 3 of 15 Patient Name: Date of Service: [name], [name] 08/25/2022 12:30 PM Medical Record Number: [id number] Patient Account Number: [id number] Date of Birth/Sex: Treating RN: Jun 11, 1951 (72 y.o. M) Primary Care Provider: PA [name], NO Other Clinician: Referring Provider: Treating Provider/Extender: [name],  [name] in Treatment: 28 History of Present Illness HPI Description: ADMISSION 02/09/2022 This is a 72 year old man who recently moved to [place] from [place]. He has a history of of coronary artery disease and prior left popliteal vein DVT . He is not diabetic and does not smoke. 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 He has had multiple spinal surgeries and has limited mobility. He primarily uses a motorized wheelchair. He has had lymphedema for many years and does have lymphedema pumps although he says he does not use them frequently. He has wounds on his bilateral lower extremities. He was receiving care in [place] for these prior to his move. They were apparently packing his wounds with Betadine soaked gauze. He has worn compression wraps in the past but not recently. He did say that they helped tremendously. In clinic today, his right ABI is noncompressible but has good signals; the left ABI was 1.17. No formal vascular studies have been performed. On his left dorsal foot, there is a circular lesion that exposes the fat layer. There is fairly heavy slough  accumulation within the wound, but no significant odor. On his right medial calf, he has multiple pinholes that have slough buildup in them and probe for about 2 to 4 mm in depth. They are draining clear fluid. On his right lateral midfoot, he has ulceration consistent with venous stasis. It is geographic and has slough accumulation. 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 02/17/2022: All of the wounds are little bit larger today and have more slough accumulation. Today, the medial calf openings are larger and probe to something that feels calcified. There is odor coming from his wounds. His vascular studies are scheduled for August 9 and 10. 02-24-2022 upon evaluation today patient presents for follow-up concerning his ongoing issues here with his lower extremities. With that being said he does have significant problems with what appears to be cellulitis unfortunately the PCR culture that was sent last week got crushed by [employer] in transit. With that being said we are going to reobtain a culture today although I am actually to do it on the right medial leg as this seems to be the most inflamed area today where there is some questionable drainage as well. I am going to remove some of the calcium deposits which are loosening obtain a deeper culture from this area. Fortunately I do not see any evidence of active infection systemically which is good news but locally I think we are having a bigger issue here. He does have his appointment next Thursday with vascular. Patient has also been referred to [facility] for further evaluation and treatment of the calcinosis for possible sulfate injections. 03/04/2022: The culture that was performed last week grew out multiple species including Klebsiella, Morganella, and Pseudomonas aeruginosa. He had been prescribed Bactrim but this was not adequate to cover all of the species  and levofloxacin was recommended. He completed the Bactrim but did not pick up the levofloxacin and begin taking it until yesterday. 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 He had arterial studies performed today. He does have evidence of peripheral arterial disease. Results copied here: +-------+-----------+-----------+------------+------------+ ABI/TBIT oday's ABIT oday's TBIPrevious ABIPrevious TBI +-------+-----------+-----------+------------+------------+ Right [place] 0.75    +-------+-----------+-----------+------------+------------+ Left   0.57    +-------+-----------+-----------+------------+------------+ Arterial wall calcification precludes accurate ankle pressures and ABIs. Summary: Right: Resting right ankle-brachial index indicates noncompressible right lower extremity arteries. The right toe-brachial index is normal. Left: Resting left ankle-brachial index indicates noncompressible left lower extremity arteries. The left toe-brachial index is abnormal. He continues to have further tissue breakdown at the right medial calf and there is ample slough accumulation along with necrotic subcutaneous tissue. The left dorsal foot wound has built up slough, as well. The right medial foot wound is nearly closed with just a light layer of eschar over the surface. The right dorsal lateral foot wound has also accumulated a fair amount of slough and remains quite tender. 03/10/2022: He has been taking the prescribed levofloxacin and has about 3 days left of this. The erythema and induration on his right leg has improved. He continues to experience further breakdown of the right medial calf wound. There is extensive slough and debris accumulation there. There is heavy slough on his left dorsal foot wound, but no  odor or purulent drainage. The right medial foot wound appears to be closed. The right dorsal lateral foot wound also has a fair amount of slough but it is less tender than at our last visit. He still does not have an appointment with dermatology. 03/22/2022: The right anterior tibial wound has closed. The right lateral foot wound is nearly closed with just a little bit of slough. The left dorsal foot wound is smaller and continues to have some slough buildup but less so than at prior visits. There is good granulation tissue underlying the slough. Unfortunately the right medial calf wound continues to deteriorate. It has a foul odor today and a lot of necrotic tissue, the surrounding skin is also erythematous and moist. 03/30/2022: The right lateral foot wound continues to contract and just has a little bit of slough and eschar present. The left dorsal foot wound is also smaller with slough overlying good granulation tissue. The right medial calf wound actually looks quite a bit better today; there is no odor and there is much less necrotic tissue although some remains present. We have been using topical gentamicin and he has been taking oral Augmentin. 04/07/2022: The patient saw dermatology at [facility] last week. Unfortunately, it appears that they did not read any of the documentation that was provided. 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 Overall, it was a highly unsatisfactory consultation. In the meantime, however, we were able to identify compounding pharmacy who could create a topical sodium thiosulfate ointment. The patient has that with him today. Both of the right foot wounds are now closed. The left dorsal foot wound  has contracted but still has a layer of slough on the surface. The medial calf wounds on the right all look a little bit better. They still have heavy slough along with the chunks of calcium. Everything is stained purple. 04/14/2022: The wound on his dorsal left foot is a little bit smaller again today. There is still slough accumulation on the surface. The medial calf wounds have quite a bit less slough accumulation today. His right leg, however, is warm and erythematous, concerning for cellulitis. [name], [name] ([id number]) [id number] Page 4 of 15 04/14/2022: He completed his course of  Augmentin yesterday. The medial calf wound sites are much cleaner today and shallower. There is less fragmented calcium in the wounds. He has a lot of lymphedema fluid-like drainage from these wounds, but no purulent discharge or malodor. The wound on his dorsal left foot has also contracted and is shallower. There is a layer of slough over healthy granulation tissue. 05/05/2022: The left dorsal foot wound is smaller today with less slough and more granulation tissue. The right medial calf wounds are shallower, but have extensive slough and some fat necrosis. The drainage has diminished considerably, however. 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 05/12/2022: The left dorsal foot wound continues to contract and fill with granulation tissue. There is slough on the wound surface. The right medial calf wounds also continue to fill with healthy tissue, but there is remaining slough and fat necrosis present. He had a significant increase in his drainage this week and it was a bright yellow-green color. He also had a new wound open over his right anterior tibial surface associated with the spike of cutaneous calcium. The leg is more red, as well, concerning for  infection. 05/19/2022: His culture returned positive for Pseudomonas. He is currently taking a course of oral levofloxacin. We have also been using topical gentamicin mixed in with his sodium thiosulfate. All of the wounds look better this week. 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 There is slough and nonviable subcutaneous tissue still present. The new anterior tibial wound just has a light layer of slough. The dorsal foot wound also has some slough present and appears a little dry today. 05/26/2022: The right medial leg wound continues to improve. It is feeling with granulation tissue, but still accumulates a fairly thick layer of slough on the surface. The more recent anterior tibial wound has remained quite small, but also has some slough on the surface. The left dorsal foot wound has contracted somewhat and has perimeter epithelialization. He completed his oral levofloxacin. 06/02/2022: The left dorsal foot wound continues to improve significantly. There is just a little slough on the wound surface. The right medial leg wound had black drainage, but it looks like silver alginate was applied last week and I theorized that silver sulfide was created with a combination of the silver alginate and the sodium thiosulfate. It did, however, have a bit of an odor to it and extensive slough accumulation. The small anterior tibial wound also had a bit of slough and nonviable subcutaneous tissue present. The skin of his leg was rather macerated, as well. 06/09/2022: The left dorsal foot wound is smaller again this week with just a light layer of slough on the surface. The right medial leg wound looks substantially better. The leg and periwound skin are no longer red and macerated. There is good granulation tissue forming with less slough and nonviable tissue. The anterior tibial wound just has a small amount of slough accumulation. 06/16/2022: The left dorsal foot wound  continues to contract. His wrap slipped a little bit, however, and his foot is more swollen. The right medial leg wound continues to improve. The underlying tissue is more vital-appearing and there is less slough. He does have substantial drainage. The small anterior tibial wound has a bit of slough accumulation. 06/23/2022: The left dorsal foot wound is about half the size as it was last week. There is just  some light slough on the surface and some eschar buildup around the edges. The right anterior leg wound is small with a little slough on the surface. Unfortunately, the right medial leg wound deteriorated fairly substantially. It is malodorous with gray/green/black discoloration. 06/30/2022: The left dorsal foot wound continues to contract. It is nearly closed with just a light layer of slough present. The right anterior leg wound is about the same size and has a small amount of slough on the surface. The right medial leg wound looks a lot better than it did last week. The odor has abated and the tissue is less pulpy and necrotic-looking. There is still a fairly thick layer of slough present. 12/13; the left dorsal foot wound looks very healthy. There was no need to change the dressing here and no debridement. He has a small area on the right anterior lower leg apparently had not changed much in size. The area on the medial leg is the most problematic area this is large with some depth and a completely nonviable surface today. 07/14/2022: The left dorsal foot wound is smaller today with just a little slough and eschar present. The right anterior lower leg wound is about the same without any significant change. The medial leg wound has a black and green surface and is malodorous. No purulent drainage and the periwound skin is intact. Edema control is suboptimal. 07/21/2022: The dorsal foot wound continues to contract and is nearly closed with just a little bit of slough and eschar on the surface.  The right anterior lower leg wound is shallower today but otherwise the dimensions are unchanged. The medial leg wound looks significantly better although it still has a nonviable surface; the odor and black/green surface has resolved. His culture came back with multiple species sensitive to Augmentin, which was prescribed and a very low level of Pseudomonas, which should be handled by the topical gentamicin we have been using. 07/28/2022: The dorsal foot wound is down to just a very tiny opening with a little eschar on the surface. The right anterior tibial wound is about the same with some slough buildup. The right medial leg wound looks quite a bit better this week. There is thick rubbery slough on the surface, but the odor and drainage has improved significantly. 08/04/2022: The dorsal foot wound is almost healed, but the periwound skin has broken down. This appears to be secondary to moisture and adhesive from the absorbent pad. The right anterior tibial wound is shallower. The right medial leg wound continues to improve. It is smaller, still with rubbery slough on the surface. No malodor or purulent drainage. 08/11/2022: The dorsal foot wound is about the same size. It is a little bit dry with some slough accumulation. The right anterior tibial wound is also unchanged. The right medial leg wound is filling in further with good granulation tissue. Still with some slough buildup on the surface. 08/18/2022: His left leg is bright red, hot, and swollen with cellulitis. The dorsal foot wound actually looks quite good and is superficial with just a little bit of slough accumulation. The right anterior tibial wound is unchanged. The right medial leg wound has filled in with good granulation tissue but has copious amounts of drainage. 08/25/2022: His cellulitis has responded nicely to the oral antibiotics. His dorsal foot wound is smaller but has a fair amount of slough buildup. The right anterior tibial  wound remains stable and unchanged. The right medial leg wound has more granulation tissue under a  layer of slough, but continues to have copious drainage. The drainage is actually causing skin irritation and threatens to result in breakdown. Electronic Signature(s) Signed: 08/25/2022 1:15:06 PM By: Fredirick Maudlin MD FACS Entered By: Fredirick Maudlin on 08/25/2022 13:15:05 Physical Exam Details -------------------------------------------------------------------------------- [name] ([id number]) [id number] Page 5 of 15 Patient Name: Date of Service: [name], [name] 08/25/2022 12:30 PM Medical Record Number: [id number] Patient Account Number: [id number] Date of Birth/Sex: Treating RN: Dec 13, 1950 (72 y.o. M) Primary Care Provider: PA [name], NO Other Clinician: Referring Provider: Treating Provider/Extender: [name], [name] in Treatment: 28 Constitutional Hypertensive, asymptomatic. . . . no acute distress. Respiratory Normal work of breathing on room air. Notes 08/25/2022: His cellulitis has responded nicely to the oral antibiotics. His dorsal foot wound is smaller but has a fair amount of slough buildup. The right anterior tibial wound remains stable and unchanged. The right medial leg wound has more granulation tissue under a layer of slough, but continues to have copious drainage. The drainage is actually causing skin irritation and threatens to result in breakdown. Electronic Signature(s) Signed: 08/25/2022 1:15:55 PM By: Fredirick Maudlin MD FACS Entered By: Fredirick Maudlin on 08/25/2022 13:15:55 -------------------------------------------------------------------------------- Physician Orders Details Patient Name: Date of Service: [name], [name] 08/25/2022 12:30 PM Medical Record Number: [id number] Patient Account Number: [id number] Date of Birth/Sex: Treating RN: 30-Apr-1951 (72 y.o. [name] Primary Care Provider: PA [name],  [place] Other Clinician: Referring Provider: Treating Provider/Extender: [name] in Treatment: 28 Verbal / Phone Orders: No Diagnosis Coding ICD-10 Coding Code Description [id number] 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d L97.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) I10 Essential (primary) hypertension I25.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 - Dr. [name] Rm 1 Return A Wednesday 2/7 @ 1230 pm Anesthetic Wound #1 Left,Dorsal Foot (In clinic) Topical Lidocaine 5% applied to wound bed (In clinic) Topical Lidocaine 4% applied to wound bed Wound #3 Right,Medial Lower Leg (In clinic) Topical Lidocaine 5% applied to wound bed (In clinic) Topical Lidocaine 4% applied to wound bed Wound #6 Right,Anterior Lower Leg (In clinic) Topical Lidocaine 5% applied to wound bed (In clinic) Topical Lidocaine 4% applied to wound bed Bathing/ Shower/ Hygiene May shower with protection but do not get wound dressing(s) wet. Protect dressing(s) with water repellant cover (for example, large plastic bag) or a cast cover and may then take shower. KASEM, MOZER ([id number]) [id number] Page 6 of 15 Edema Control - Lymphedema / SCD / Other Lymphedema Pumps. Use Lymphedema pumps on leg(s) 2-3 times a day for 45-60 minutes. If wearing any wraps or hose, do not remove them. Continue exercising as instructed. - use 1-2 times per day over wraps Elevate legs to the level of the heart or above for 30 minutes daily and/or when sitting for 3-4 times a day throughout the day. - throughout the day Exercise regularly [place] to [facility] for skilled nursing wound care. May utilize formulary equivalent dressing for wound treatment orders unless otherwise specified. New wound care orders this week; continue Home Health for wound care. May utilize formulary equivalent dressing for wound treatment orders unless otherwise specified. - bilateral lower extremity wraps to be changed weekly Wound Treatment Wound #1 -  Foot Wound Laterality: Dorsal, Left Peri-Wound Care: Triamcinolone 15 (g) 1 x Per Week/30 Days Discharge Instructions: Use triamcinolone 15 (g) as directed Peri-Wound Care: Sween Lotion (Moisturizing lotion) 1 x Per Week/30 Days Discharge Instructions: Apply moisturizing lotion as directed Prim Dressing: Promogran Prisma Matrix, 4.34 (sq in) (silver collagen) 1 x Per Week/30 Days ary Discharge Instructions: Moisten collagen with saline or hydrogel Secondary Dressing: ABD Pad, 5x9 1 x Per Week/30 Days Discharge Instructions: Apply over primary dressing as directed. Secondary Dressing: Woven Gauze Sponge, Non-Sterile 4x4 in 1 x Per Week/30 Days Discharge Instructions: Apply over primary dressing as directed. Compression Wrap: ThreePress (3 layer compression wrap) 1 x Per Week/30 Days Discharge Instructions: Apply three layer compression as directed. Wound #3 - Lower Leg Wound Laterality: Right, Medial Peri-Wound Care: Triamcinolone 15 (g) 1 x Per Week/30 Days Discharge Instructions: Use triamcinolone 15 (g) as directed Peri-Wound Care: Zinc Oxide Ointment 30g tube 1 x Per Week/30 Days Discharge Instructions: Apply Zinc Oxide to periwound with each dressing change Peri-Wound Care: Sween Lotion (Moisturizing lotion) 1 x Per Week/30 Days Discharge Instructions: Apply moisturizing lotion as directed Prim Dressing: Santyl Ointment 1 x Per Week/30 Days ary Discharge Instructions: Apply nickel thick amount to wound bed as instructed Prim Dressing: sodium thiosulfate ointment 1 x Per Week/30  Days ary Discharge Instructions: 1:1 mix with santyl to wound bed Secondary Dressing: ABD Pad, 8x10 1 x Per Week/30 Days Discharge Instructions: Apply over primary dressing as directed. Secondary Dressing: Woven Gauze Sponge, Non-Sterile 4x4 in 1 x Per Week/30 Days Discharge Instructions: Apply over primary dressing as directed. Secondary Dressing: Zetuvit Plus 4x8 in 1 x Per Week/30 Days Discharge Instructions: Apply over primary dressing as directed. Compression Wrap: ThreePress (3 layer compression wrap) 1 x Per Week/30 Days Discharge Instructions: Apply three layer compression as directed. Wound #6 - Lower Leg Wound Laterality: Right, Anterior Peri-Wound Care: Sween Lotion (Moisturizing lotion) 1 x Per Week/30 Days Discharge Instructions: Apply moisturizing lotion as directed Prim Dressing: Promogran Prisma Matrix, 4.34 (sq in) (silver collagen) 1 x Per Week/30 Days ary Discharge Instructions: Moisten collagen with saline or hydrogel Secondary Dressing: ABD Pad, 8x10 1 x Per Week/30 Days Discharge Instructions: Apply over primary dressing as directed. Secondary Dressing: Woven Gauze Sponge, Non-Sterile 4x4 in 1 x Per Week/30 Days Discharge Instructions: Apply over primary dressing as directed. Secondary Dressing: Zetuvit Plus 4x8 in 1 x Per Week/30 Days Discharge Instructions: Apply over primary dressing as directed. [name], [name] ([id number]) [id number] Page 7 of 15 Compression Wrap: ThreePress (3 layer compression wrap) 1 x Per Week/30 Days Discharge Instructions: Apply three layer compression as directed. Electronic Signature(s) Signed: 08/25/2022 2:55:26 PM By: Fredirick Maudlin MD FACS Entered By: Fredirick Maudlin on 08/25/2022 13:16:31 -------------------------------------------------------------------------------- Problem List Details Patient Name: Date of Service: [name], [name] 08/25/2022 12:30 PM Medical Record Number: [id number] Patient Account  Number: [id number] Date of Birth/Sex: Treating RN: 1950-09-02 (72 y.o. 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) 02/09/2022 No Yes I10 Essential (primary) hypertension 02/09/2022 No Yes I25.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 02/09/2022  No Yes I89.0 Lymphedema, not elsewhere classified 02/09/2022 No Yes Z86.718 Personal history of other venous thrombosis and embolism 02/09/2022 No Yes Inactive Problems ICD-10 Code Description Active Date Inactive Date L97.512 Non-pressure chronic ulcer of other part of right foot with fat layer exposed 02/17/2022 02/17/2022 Resolved Problems OSMANI, KERSTEN ([id number]) [phone].pdf Page 8 of 15 Electronic Signature(s) Signed: 08/25/2022 1:13:09 PM By: Fredirick Maudlin MD FACS Previous Signature: 08/25/2022 1:02:55 PM Version By: Fredirick Maudlin MD FACS Entered By: Fredirick Maudlin on 08/25/2022 13:13:08 -------------------------------------------------------------------------------- Progress Note Details Patient Name: Date of Service: [name], [name] 08/25/2022 12:30 PM Medical Record Number: [id number] Patient Account Number: [id number] Date of Birth/Sex: Treating RN: [birth year] (72 y.o. M) Primary Care Provider: PA [name], [place] Other Clinician: Referring Provider: Treating Provider/Extender: [name] in Treatment: 28 Subjective Chief Complaint Information  obtained from Patient Patient seen for complaints of Non-Healing Wounds. History of Present Illness (HPI) ADMISSION 02/09/2022 This is a 72 year old man who recently moved to [place] from [place]. He has a history of of coronary artery disease and prior left popliteal vein DVT . He is not diabetic and does not smoke. 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 He has had multiple spinal surgeries and has limited mobility. He primarily uses a motorized wheelchair. He has had lymphedema for many years and does have lymphedema pumps although he says he does not use them frequently. He has wounds on his bilateral lower extremities. He was receiving care in [place] for these prior to his move. They were apparently packing his wounds with Betadine soaked gauze. He has worn compression wraps in the past but not recently. He did say that they helped tremendously. In clinic today, his right ABI is noncompressible but has good signals; the left ABI was 1.17. No formal vascular studies have been performed. On his left dorsal foot, there is a circular lesion that exposes the fat layer. There is fairly heavy slough accumulation within the wound, but no significant odor. On his right medial calf, he has multiple pinholes that have slough buildup in them and probe for about 2 to 4 mm in depth. They are draining clear fluid. On his right lateral midfoot, he has ulceration consistent with venous stasis. It is geographic and has slough accumulation. 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 02/17/2022: All of the wounds are little bit larger today and have more slough accumulation. Today, the medial calf openings are larger and probe to something that feels calcified. There is odor coming from his wounds. His vascular studies are scheduled for August 9 and 10. 02-24-2022 upon evaluation today patient presents for  follow-up concerning his ongoing issues here with his lower extremities. With that being said he does have significant problems with what appears to be cellulitis unfortunately the PCR culture that was sent last week got crushed by [employer] in transit. With that being said we are going to reobtain a culture today although I am actually to do it on the right medial leg as this seems to be the most inflamed area today where there is some questionable drainage as well. I am going to remove some of the calcium deposits which are loosening obtain a deeper culture from this area. Fortunately I do not see any evidence of active infection systemically which is good news but locally I think we are having a bigger issue here. He does have his  appointment next Thursday with vascular. Patient has also been referred to [facility] for further evaluation and treatment of the calcinosis for possible sulfate injections. 03/04/2022: The culture that was performed last week grew out multiple species including Klebsiella, Morganella, and Pseudomonas aeruginosa. He had been prescribed Bactrim but this was not adequate to cover all of the species and levofloxacin was recommended. He completed the Bactrim but did not pick up the levofloxacin and begin taking it until yesterday. 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 He had arterial studies performed today. He does have evidence of peripheral arterial disease. Results copied here: +-------+-----------+-----------+------------+------------+ ABI/TBIT oday's ABIT oday's TBIPrevious ABIPrevious TBI +-------+-----------+-----------+------------+------------+ Right [place] 0.75    +-------+-----------+-----------+------------+------------+ Left [place] 0.57     +-------+-----------+-----------+------------+------------+ Arterial wall calcification precludes accurate ankle pressures and ABIs. Summary: Right: Resting right ankle-brachial index indicates noncompressible right lower extremity arteries. The right toe-brachial index is normal. Left: Resting left ankle-brachial index indicates noncompressible left lower extremity arteries. The left toe-brachial index is abnormal. He continues to have further tissue breakdown at the right medial calf and there is ample slough accumulation along with necrotic subcutaneous tissue. The left dorsal foot wound has built up slough, as well. The right medial foot wound is nearly closed with just a light layer of eschar over the surface. The right dorsal lateral foot wound has also accumulated a fair amount of slough and remains quite tender. KANI, CHAUVIN ([id number]) [id number] Page 9 of 15 03/10/2022: He has been taking the prescribed levofloxacin and has about 3 days left of this. The erythema and induration on his right leg has improved. He continues to experience further breakdown of the right medial calf wound. There is extensive slough and debris accumulation there. There is heavy slough on his left dorsal foot wound, but no odor or purulent drainage. The right medial foot wound appears to be closed. The right dorsal lateral foot wound also has a fair amount of slough but it is less tender than at our last visit. He still does not have an appointment with dermatology. 03/22/2022: The right anterior tibial wound has closed. The right lateral foot wound is nearly closed with just a little bit of slough. The left dorsal foot wound is smaller and continues to have some slough buildup but less so than at prior visits. There is good granulation tissue underlying the slough. Unfortunately the right medial calf wound continues to deteriorate. It has a foul odor today and a lot of necrotic  tissue, the surrounding skin is also erythematous and moist. 03/30/2022: The right lateral foot wound continues to contract and just has a little bit of slough and eschar present. The left dorsal foot wound is also smaller with slough overlying good granulation tissue. The right medial calf wound actually looks quite a bit better today; there is no odor and there is much less necrotic tissue although some remains present. We have been using topical gentamicin and he has been taking oral Augmentin. 04/07/2022: The patient saw dermatology at [facility] last week. Unfortunately, it appears that they did not read any of the documentation that was provided. 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 Overall, it was a highly unsatisfactory consultation. In the meantime, however, we were able to identify compounding pharmacy who could create a topical sodium thiosulfate ointment. The patient has that with him today. Both of the right foot wounds are now closed. The left dorsal foot wound has contracted but still has a layer of slough on the surface. The medial calf wounds on the right all look a little bit better. They still have heavy slough along with the chunks of calcium. Everything is stained purple. 04/14/2022: The wound on his dorsal left foot is a little bit smaller again today. There is still slough accumulation on the surface. The medial calf wounds have quite a bit less slough accumulation today. His right leg, however, is warm and erythematous, concerning for cellulitis. 04/14/2022: He completed his course of Augmentin yesterday. The medial calf wound sites are much cleaner today and shallower. There is less fragmented calcium in the  wounds. He has a lot of lymphedema fluid-like drainage from these wounds, but no purulent discharge or malodor. The wound on his dorsal left foot has also contracted and is shallower. There is a layer of slough over healthy granulation tissue. 05/05/2022: The left dorsal foot wound is smaller today with less slough and more granulation tissue. The right medial calf wounds are shallower, but have extensive slough and some fat necrosis. The drainage has diminished considerably, however. 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 05/12/2022: The left dorsal foot wound continues to contract and fill with granulation tissue. There is slough on the wound surface. The right medial calf wounds also continue to fill with healthy tissue, but there is remaining slough and fat necrosis present. He had a significant increase in his drainage this week and it was a bright yellow-green color. He also had a new wound open over his right anterior tibial surface associated with the spike of cutaneous calcium. The leg is more red, as well, concerning for infection. 05/19/2022: His culture returned positive for Pseudomonas. He is currently taking a course of oral levofloxacin. We have also been using topical gentamicin mixed in with his sodium thiosulfate. All of the wounds look better this week. 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 There is slough and nonviable subcutaneous tissue still present. The new anterior tibial wound just has a light layer of slough. The dorsal foot wound also has some slough present and appears a little dry today. 05/26/2022: The right medial leg wound continues to improve. It is feeling with granulation tissue, but still accumulates a fairly thick layer of slough on the surface. The more recent anterior tibial wound has remained quite small,  but also has some slough on the surface. The left dorsal foot wound has contracted somewhat and has perimeter epithelialization. He completed his oral levofloxacin. 06/02/2022: The left dorsal foot wound continues to improve significantly. There is just a little slough on the wound surface. The right medial leg wound had black drainage, but it looks like silver alginate was applied last week and I theorized that silver sulfide was created with a combination of the silver alginate and the sodium thiosulfate. It did, however, have a bit of an odor to it and extensive slough accumulation. The small anterior tibial wound also had a bit of slough and nonviable subcutaneous tissue present. The skin of his  leg was rather macerated, as well. 06/09/2022: The left dorsal foot wound is smaller again this week with just a light layer of slough on the surface. The right medial leg wound looks substantially better. The leg and periwound skin are no longer red and macerated. There is good granulation tissue forming with less slough and nonviable tissue. The anterior tibial wound just has a small amount of slough accumulation. 06/16/2022: The left dorsal foot wound continues to contract. His wrap slipped a little bit, however, and his foot is more swollen. The right medial leg wound continues to improve. The underlying tissue is more vital-appearing and there is less slough. He does have substantial drainage. The small anterior tibial wound has a bit of slough accumulation. 06/23/2022: The left dorsal foot wound is about half the size as it was last week. There is just some light slough on the surface and some eschar buildup around the edges. The right anterior leg wound is small with a little slough on the surface. Unfortunately, the right medial leg wound deteriorated fairly substantially. It is malodorous with gray/green/black discoloration. 06/30/2022: The left dorsal foot wound continues to contract. It is nearly  closed with just a light layer of slough present. The right anterior leg wound is about the same size and has a small amount of slough on the surface. The right medial leg wound looks a lot better than it did last week. The odor has abated and the tissue is less pulpy and necrotic-looking. There is still a fairly thick layer of slough present. 12/13; the left dorsal foot wound looks very healthy. There was no need to change the dressing here and no debridement. He has a small area on the right anterior lower leg apparently had not changed much in size. The area on the medial leg is the most problematic area this is large with some depth and a completely nonviable surface today. 07/14/2022: The left dorsal foot wound is smaller today with just a little slough and eschar present. The right anterior lower leg wound is about the same without any significant change. The medial leg wound has a black and green surface and is malodorous. No purulent drainage and the periwound skin is intact. Edema control is suboptimal. 07/21/2022: The dorsal foot wound continues to contract and is nearly closed with just a little bit of slough and eschar on the surface. The right anterior lower leg wound is shallower today but otherwise the dimensions are unchanged. The medial leg wound looks significantly better although it still has a nonviable surface; the odor and black/green surface has resolved. His culture came back with multiple species sensitive to Augmentin, which was prescribed and a very low level of Pseudomonas, which should be handled by the topical gentamicin we have been using. 07/28/2022: The dorsal foot wound is down to just a very tiny opening with a little eschar on the surface. The right anterior tibial wound is about the same with some slough buildup. The right medial leg wound looks quite a bit better this week. There is thick rubbery slough on the surface, but the odor and drainage has improved  significantly. 08/04/2022: The dorsal foot wound is almost healed, but the periwound skin has broken down. This appears to be secondary to moisture and adhesive from the absorbent pad. The right anterior tibial wound is shallower. The right medial leg wound continues to improve. It is smaller, still with rubbery slough on the surface. No malodor or purulent drainage. 08/11/2022: The dorsal  foot wound is about the same size. It is a little bit dry with some slough accumulation. The right anterior tibial wound is also unchanged. The right medial leg wound is filling in further with good granulation tissue. Still with some slough buildup on the surface. 08/18/2022: His left leg is bright red, hot, and swollen with cellulitis. The dorsal foot wound actually looks quite good and is superficial with just a little bit of MORDCHE, HEDGLIN ([id number]) [id number] Page 10 of 15 slough accumulation. The right anterior tibial wound is unchanged. The right medial leg wound has filled in with good granulation tissue but has copious amounts of drainage. 08/25/2022: His cellulitis has responded nicely to the oral antibiotics. His dorsal foot wound is smaller but has a fair amount of slough buildup. The right anterior tibial wound remains stable and unchanged. The right medial leg wound has more granulation tissue under a layer of slough, but continues to have copious drainage. The drainage is actually causing skin irritation and threatens to result in breakdown. Patient History Information obtained from Patient, Chart. Family History Cancer - Mother, Heart Disease - Father,Mother, Hypertension - Father,Siblings, Lung Disease - Siblings, No family history of Diabetes, Hereditary Spherocytosis, Kidney Disease, Seizures, Stroke, Thyroid Problems, Tuberculosis. Social History Former smoker - quit 1991, Marital Status - Married, Alcohol Use - Moderate, Drug Use - No History, Caffeine Use - Daily -  coffee. Medical History Eyes Patient has history of Cataracts - right eye removed Denies history of Glaucoma, Optic Neuritis Ear/Nose/Mouth/Throat Denies history of Chronic sinus problems/congestion, Middle ear problems Hematologic/Lymphatic Patient has history of Anemia - macrocytic, Lymphedema Cardiovascular Patient has history of Congestive Heart Failure, Deep Vein Thrombosis - left leg, Hypertension, Peripheral Venous Disease Endocrine Denies history of Type I Diabetes, Type II Diabetes Genitourinary Denies history of End Stage Renal Disease Integumentary (Skin) Denies history of History of Burn Oncologic Denies history of Received Chemotherapy, Received Radiation Psychiatric Denies history of Anorexia/bulimia, Confinement Anxiety Hospitalization/Surgery History - cervical fusion. - lumbar surgery. - coronary stent placement. - lasik eye surgery. - hydrocele excision. Medical A Surgical History Notes nd Constitutional Symptoms (General Health) obesity Ear/Nose/Mouth/Throat hard of hearing Respiratory frozen diaphragm right Cardiovascular hyperlipidemia Musculoskeletal myositis, lumbar DDD, spinal stenosis, cervical facet joint syndrome Objective Constitutional Hypertensive, asymptomatic. no acute distress. Vitals Time Taken: 12:36 PM, Height: 71 in, Weight: 267 lbs, BMI: 37.2, Temperature: 97.9 F, Pulse: 62 bpm, Respiratory Rate: 20 breaths/min, Blood Pressure: 165/83 mmHg. Respiratory Normal work of breathing on room air. General Notes: 08/25/2022: His cellulitis has responded nicely to the oral antibiotics. His dorsal foot wound is smaller but has a fair amount of slough buildup. The right anterior tibial wound remains stable and unchanged. The right medial leg wound has more granulation tissue under a layer of slough, but continues to have copious drainage. The drainage is actually causing skin irritation and threatens to result in breakdown. Integumentary (Hair,  Skin) Wound #1 status is Open. Original cause of wound was Gradually Appeared. The date acquired was: 12/07/2021. The wound has been in treatment 28 weeks. The wound is located on the Left,Dorsal Foot. The wound measures 1.4cm length x 1.2cm width x 0.1cm depth; 1.319cm^2 area and 0.132cm^3 volume. There is Fat Layer (Subcutaneous Tissue) exposed. There is no tunneling or undermining noted. There is a medium amount of serous drainage noted. The wound margin is flat and intact. There is medium (34-66%) red, pink granulation within the wound bed. There is a medium (34-66%) amount of necrotic tissue within  the wound bed including Adherent Slough. The periwound skin appearance had no abnormalities noted for texture. The periwound skin appearance exhibited: Hemosiderin Staining. The periwound skin appearance did not exhibit: Dry/Scaly, Maceration, Rubor, Erythema. Periwound temperature was noted as Hot. The periwound has tenderness on palpation. Wound #3 status is Open. Original cause of wound was Gradually Appeared. The date acquired was: 12/07/2021. The wound has been in treatment 28 weeks. The wound is located on the Right,Medial Lower Leg. The wound measures 3.1cm length x 6.3cm width x 0.9cm depth; 15.339cm^2 area and 13.805cm^3 [name], [name] ([id number]) [phone].pdf Page 11 of 15 volume. There is Fat Layer (Subcutaneous Tissue) exposed. There is no tunneling or undermining noted. There is a large amount of serosanguineous drainage noted. The wound margin is distinct with the outline attached to the wound base. There is small (1-33%) red, pink granulation within the wound bed. There is a large (67-100%) amount of necrotic tissue within the wound bed including Adherent Slough. The periwound skin appearance exhibited: Excoriation, Hemosiderin Staining, Rubor. The periwound skin appearance did not exhibit: Dry/Scaly, Maceration, Erythema. Periwound temperature was noted as No  Abnormality. The periwound has tenderness on palpation. Wound #6 status is Open. Original cause of wound was Gradually Appeared. The date acquired was: 05/12/2022. The wound has been in treatment 15 weeks. The wound is located on the Right,Anterior Lower Leg. The wound measures 0.3cm length x 0.2cm width x 0.2cm depth; 0.047cm^2 area and 0.009cm^3 volume. There is Fat Layer (Subcutaneous Tissue) exposed. There is no tunneling or undermining noted. There is a medium amount of purulent drainage noted. The wound margin is epibole. There is small (1-33%) red granulation within the wound bed. There is no necrotic tissue within the wound bed. The periwound skin appearance had no abnormalities noted for texture. The periwound skin appearance had no abnormalities noted for moisture. The periwound skin appearance exhibited: Hemosiderin Staining. The periwound skin appearance did not exhibit: Atrophie Blanche, Cyanosis, Ecchymosis, Mottled, Pallor, Rubor, Erythema. Periwound temperature was noted as No Abnormality. 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 There was a Excisional Skin/Subcutaneous Tissue Debridement with a total area of 1.68 sq cm performed by Fredirick Maudlin, MD. With the following instrument(s): Curette to remove Viable and Non-Viable tissue/material. Material removed includes Subcutaneous Tissue and Slough and after achieving pain control using Lidocaine 4% T opical  Solution. No specimens were taken. A time out was conducted at 13:05, prior to the start of the procedure. A Minimum amount of bleeding was controlled with Pressure. The procedure was tolerated well with a pain level of 0 throughout and a pain level of 0 following the procedure. Post Debridement Measurements: 1.4cm length x 1.2cm width x 0.1cm depth; 0.132cm^3 volume. Character of Wound/Ulcer Post Debridement is improved. Post procedure Diagnosis Wound #1: Same as Pre-Procedure General Notes: Scribed for Dr. [name] by Baruch Gouty, RN. Pre-procedure diagnosis of Wound #1 is a Lymphedema located on the Left,Dorsal Foot . There was a Three Layer Compression Therapy Procedure by Baruch Gouty, RN. Post procedure Diagnosis Wound #1: Same as Pre-Procedure Wound #3 Pre-procedure diagnosis of  Wound #3 is a Lymphedema located on the Right,Medial Lower Leg . There was a Excisional Skin/Subcutaneous Tissue Debridement with a total area of 15.5 sq cm performed by Fredirick Maudlin, MD. With the following instrument(s): Curette to remove Viable and Non-Viable tissue/material. Material removed includes Subcutaneous Tissue and Slough and after achieving pain control using Lidocaine 4% T opical Solution. No specimens were taken. A time out was conducted at 13:05, prior to the start of the procedure. A Minimum amount of bleeding was controlled with Pressure. The procedure was tolerated well with a pain level of 3 throughout and a pain level of 1 following the procedure. Post Debridement Measurements: 3.1cm length x 6.3cm width x 0.9cm depth; 13.805cm^3 volume. Character of Wound/Ulcer Post Debridement is improved. Post procedure Diagnosis Wound #3: Same as Pre-Procedure General Notes: Scribed for Dr. [name] by Baruch Gouty, RN. Pre-procedure diagnosis of Wound #3 is a Lymphedema located on the Right,Medial Lower Leg . There was a Three Layer Compression Therapy Procedure by Baruch Gouty, RN. Post  procedure Diagnosis Wound #3: Same as Pre-Procedure Plan Follow-up Appointments: Return Appointment in 1 week. - Dr. [name] Rm 1 Wednesday 2/7 @ 1230 pm Anesthetic: Wound #1 Left,Dorsal Foot: (In clinic) Topical Lidocaine 5% applied to wound bed (In clinic) Topical Lidocaine 4% applied to wound bed Wound #3 Right,Medial Lower Leg: (In clinic) Topical Lidocaine 5% applied to wound bed (In clinic) Topical Lidocaine 4% applied to wound bed Wound #6 Right,Anterior Lower Leg: (In clinic) Topical Lidocaine 5% applied to wound bed (In clinic) Topical Lidocaine 4% applied to wound bed [place], [name] ([id number]) [id number] Page 12 of 15 Bathing/ Shower/ Hygiene: May shower with protection but do not get wound dressing(s) wet. Protect dressing(s) with water repellant cover (for example, large plastic bag) or a cast cover and may then take shower. Edema Control - Lymphedema / SCD / Other: Lymphedema Pumps. Use Lymphedema pumps on leg(s) 2-3 times a day for 45-60 minutes. If wearing any wraps or hose, do not remove them. Continue exercising as instructed. - use 1-2 times per day over wraps Elevate legs to the level of the heart or above for 30 minutes daily and/or when sitting for 3-4 times a day throughout the day. - throughout the day Exercise regularly Home Health: Admit to [facility] for skilled nursing wound care. May utilize formulary equivalent dressing for wound treatment orders unless otherwise specified. New wound care orders this week; continue Home Health for wound care. May utilize formulary equivalent dressing for wound treatment orders unless otherwise specified. - bilateral lower extremity wraps to be changed weekly WOUND #1: - Foot Wound Laterality: Dorsal, Left Peri-Wound Care: Triamcinolone 15 (g) 1 x Per Week/30 Days Discharge Instructions: Use triamcinolone 15 (g) as directed Peri-Wound Care: Sween Lotion (Moisturizing lotion) 1 x Per Week/30  Days Discharge Instructions: Apply moisturizing lotion as directed Prim Dressing: Promogran Prisma Matrix, 4.34 (sq in) (silver collagen) 1 x Per Week/30 Days ary Discharge Instructions: Moisten collagen with saline or hydrogel Secondary Dressing: ABD Pad, 5x9 1 x Per Week/30 Days Discharge Instructions: Apply over primary dressing as directed. Secondary Dressing: Woven Gauze Sponge, Non-Sterile 4x4 in 1 x Per Week/30 Days Discharge Instructions: Apply over primary dressing as directed. Com pression Wrap: ThreePress (3 layer compression wrap) 1 x Per Week/30 Days Discharge Instructions: Apply three layer compression as directed. WOUND #3: - Lower Leg Wound Laterality: Right, Medial Peri-Wound Care: Triamcinolone 15 (g) 1 x Per Week/30 Days Discharge Instructions: Use triamcinolone 15 (g) as directed Peri-Wound  Care: Zinc Oxide Ointment 30g tube 1 x Per Week/30 Days Discharge Instructions: Apply Zinc Oxide to periwound with each dressing change Peri-Wound Care: Sween Lotion (Moisturizing lotion) 1 x Per Week/30 Days Discharge Instructions: Apply moisturizing lotion as directed Prim Dressing: Santyl Ointment 1 x Per Week/30 Days ary Discharge Instructions: Apply nickel thick amount to wound bed as instructed Prim Dressing: sodium thiosulfate ointment 1 x Per Week/30 Days ary Discharge Instructions: 1:1 mix with santyl to wound bed Secondary Dressing: ABD Pad, 8x10 1 x Per Week/30 Days Discharge Instructions: Apply over primary dressing as directed. Secondary Dressing: Woven Gauze Sponge, Non-Sterile 4x4 in 1 x Per Week/30 Days Discharge Instructions: Apply over primary dressing as directed. Secondary Dressing: Zetuvit Plus 4x8 in 1 x Per Week/30 Days Discharge Instructions: Apply over primary dressing as directed. Com pression Wrap: ThreePress (3 layer compression wrap) 1 x Per Week/30 Days Discharge Instructions: Apply three layer compression as directed. WOUND #6: - Lower Leg Wound  Laterality: Right, Anterior Peri-Wound Care: Sween Lotion (Moisturizing lotion) 1 x Per Week/30 Days Discharge Instructions: Apply moisturizing lotion as directed Prim Dressing: Promogran Prisma Matrix, 4.34 (sq in) (silver collagen) 1 x Per Week/30 Days ary Discharge Instructions: Moisten collagen with saline or hydrogel Secondary Dressing: ABD Pad, 8x10 1 x Per Week/30 Days Discharge Instructions: Apply over primary dressing as directed. Secondary Dressing: Woven Gauze Sponge, Non-Sterile 4x4 in 1 x Per Week/30 Days Discharge Instructions: Apply over primary dressing as directed. Secondary Dressing: Zetuvit Plus 4x8 in 1 x Per Week/30 Days Discharge Instructions: Apply over primary dressing as directed. Com pression Wrap: ThreePress (3 layer compression wrap) 1 x Per Week/30 Days Discharge Instructions: Apply three layer compression as directed. 08/25/2022: His cellulitis has responded nicely to the oral antibiotics. His dorsal foot wound is smaller but has a fair amount of slough buildup. The right anterior tibial wound remains stable and unchanged. The right medial leg wound has more granulation tissue under a layer of slough, but continues to have copious drainage. The drainage is actually causing skin irritation and threatens to result in breakdown. I used a curette to debride slough and nonviable subcutaneous tissue from the dorsal foot wound and the right medial leg wound. No debridement was necessary for the anterior tibial wound. We will continue Prisma silver collagen to the dorsal foot wound. Continue Santyl mixed with topical compounded sodium thiosulfate to the medial leg wound. We are hoping to arrange home health so that he can have his dressing changed more frequently due to the amount of drainage he is producing. For now, we will continue to use topical zinc oxide to the right leg periwound to try and minimize the effects of the moisture. We will continue 3 layer compression  bilaterally. Follow-up in 1 week. Electronic Signature(s) Signed: 08/25/2022 1:17:47 PM By: Fredirick Maudlin MD FACS Entered By: Fredirick Maudlin on 08/25/2022 13:17:47 HxROS Details -------------------------------------------------------------------------------- [name] ([id number]) [id number] Page 13 of 15 Patient Name: Date of Service: SHAE, AUGELLO 08/25/2022 12:30 PM Medical Record Number: [id number] Patient Account Number: [id number] Date of Birth/Sex: Treating RN: 1951-07-04 (72 y.o. M) Primary Care Provider: PA [name], NO Other Clinician: Referring Provider: Treating Provider/Extender: [name] in Treatment: 28 Information Obtained From Patient Chart Constitutional Symptoms (General Health) Medical History: Past Medical History Notes: obesity Eyes Medical History: Positive for: Cataracts - right eye removed Negative for: Glaucoma; Optic Neuritis Ear/Nose/Mouth/Throat Medical History: Negative for: Chronic sinus problems/congestion; Middle ear problems Past Medical History Notes: hard of hearing  Hematologic/Lymphatic Medical History: Positive for: Anemia - macrocytic; Lymphedema Respiratory Medical History: Past Medical History Notes: frozen diaphragm right Cardiovascular Medical History: Positive for: Congestive Heart Failure; Deep Vein Thrombosis - left leg; Hypertension; Peripheral Venous Disease Past Medical History Notes: hyperlipidemia Endocrine Medical History: Negative for: Type I Diabetes; Type II Diabetes Genitourinary Medical History: Negative for: End Stage Renal Disease Integumentary (Skin) Medical History: Negative for: History of Burn Musculoskeletal Medical History: Past Medical History Notes: myositis, lumbar DDD, spinal stenosis, cervical facet joint syndrome Oncologic Medical History: Negative for: Received Chemotherapy; Received Radiation Psychiatric Medical  History: Negative for: [place], [place] ([id number]) [id number] Page 14 of 15 HBO Extended History Items Eyes: Cataracts Immunizations Pneumococcal Vaccine: Received Pneumococcal Vaccination: Yes Received Pneumococcal Vaccination On or After 60th Birthday: Yes Implantable Devices No devices added Hospitalization / Surgery History Type of Hospitalization/Surgery cervical fusion lumbar surgery coronary stent placement lasik eye surgery hydrocele excision Family and Social History Cancer: Yes - Mother; Diabetes: No; Heart Disease: Yes - Father,Mother; Hereditary Spherocytosis: No; Hypertension: Yes - Father,Siblings; Kidney Disease: No; Lung Disease: Yes - Siblings; Seizures: No; Stroke: No; Thyroid Problems: No; Tuberculosis: No; Former smoker - quit 1991; Marital Status - Married; Alcohol Use: Moderate; Drug Use: No History; Caffeine Use: Daily - coffee; Financial Concerns: No; Food, Clothing or Shelter Needs: No; Support System Lacking: No; Transportation Concerns: No Electronic Signature(s) Signed: 08/25/2022 2:55:26 PM By: Fredirick Maudlin MD FACS Entered By: Fredirick Maudlin on 08/25/2022 13:15:33 -------------------------------------------------------------------------------- SuperBill Details Patient Name: Date of Service: DEAKON, FRIX 08/25/2022 Medical Record Number: [id number] Patient Account Number: [id number] Date of Birth/Sex: Treating RN: 1950-10-29 (72 y.o. M) Primary Care Provider: PA [name], NO Other Clinician: Referring Provider: Treating Provider/Extender: [name] in Treatment: 28 Diagnosis Coding ICD-10 Codes Code Description [phone] 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d L97.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)  (peripheral) I10 Essential (primary) hypertension I25.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 I89.0 Lymphedema, not elsewhere classified Z86.718 Personal history of other venous thrombosis and embolism Facility Procedures : CPT4 Code: [id number] Description: 76226 - DEB SUBQ TISSUE 20 SQ CM/< ICD-10 Diagnosis Description J33.545 Non-pressure chronic ulcer of other part of right lower leg with fat layer exp L97.522 Non-pressure chronic ulcer of other part of left foot with fat layer exposed Modifier: osed Quantity: 1 Physician Procedures : CPT4 Code Description Modifier SHAKIL, DIRK ([id number]) [id number] - WC PHYS LEVEL 4 - EST PT 25 1 ICD-10 Diagnosis Description L97.812 Non-pressure chronic ulcer of other part of right lower leg with fat layer  exposed L97.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) Quantity: 7.pdf Page 15 of 15 : [id number] - WC PHYS SUBQ TISS 20 SQ CM 1 ICD-10 Diagnosis Description A45.364 Non-pressure chronic ulcer of other part of right lower leg with fat layer exposed L97.522 Non-pressure chronic ulcer of other part of left foot with fat layer exposed Quantity: Electronic Signature(s) Signed: 08/25/2022 1:18:10 PM By: Fredirick Maudlin MD FACS Entered By: Fredirick Maudlin on 08/25/2022 13:18:09

## 2022-08-28 DIAGNOSIS — L97522 Non-pressure chronic ulcer of other part of left foot with fat layer exposed: Secondary | ICD-10-CM | POA: Diagnosis not present

## 2022-08-28 DIAGNOSIS — Z6837 Body mass index (BMI) 37.0-37.9, adult: Secondary | ICD-10-CM | POA: Diagnosis not present

## 2022-08-28 DIAGNOSIS — I251 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: Secondary | ICD-10-CM | POA: Diagnosis not present

## 2022-08-28 DIAGNOSIS — L97812 Non-pressure chronic ulcer of other part of right lower leg with fat layer exposed: Secondary | ICD-10-CM | POA: Diagnosis not present

## 2022-08-28 DIAGNOSIS — L942 Calcinosis cutis: Secondary | ICD-10-CM | POA: Diagnosis not present

## 2022-08-28 DIAGNOSIS — M609 Myositis, unspecified: Secondary | ICD-10-CM | POA: Diagnosis not present

## 2022-08-28 DIAGNOSIS — Z955 Presence of coronary angioplasty implant and graft: Secondary | ICD-10-CM | POA: Diagnosis not present

## 2022-08-28 DIAGNOSIS — M5136 Other intervertebral disc degeneration, lumbar region: Secondary | ICD-10-CM | POA: Diagnosis not present

## 2022-08-28 DIAGNOSIS — Z7901 Long term (current) use of anticoagulants: Secondary | ICD-10-CM | POA: Diagnosis not present

## 2022-08-28 DIAGNOSIS — I10 Essential (primary) hypertension: Secondary | ICD-10-CM | POA: Diagnosis not present

## 2022-08-28 DIAGNOSIS — M5382 Other specified dorsopathies, cervical region: Secondary | ICD-10-CM | POA: Diagnosis not present

## 2022-08-28 DIAGNOSIS — I890 Lymphedema, not elsewhere classified: Secondary | ICD-10-CM | POA: Diagnosis not present

## 2022-08-28 DIAGNOSIS — E785 Hyperlipidemia, unspecified: Secondary | ICD-10-CM | POA: Diagnosis not present

## 2022-08-28 DIAGNOSIS — D539 Nutritional anemia, unspecified: Secondary | ICD-10-CM | POA: Diagnosis not present

## 2022-08-28 DIAGNOSIS — Z87891 Personal history of nicotine dependence: Secondary | ICD-10-CM | POA: Diagnosis not present

## 2022-08-28 DIAGNOSIS — Z981 Arthrodesis status: Secondary | ICD-10-CM | POA: Diagnosis not present

## 2022-08-28 DIAGNOSIS — L03116 Cellulitis of left lower limb: Secondary | ICD-10-CM | POA: Diagnosis not present

## 2022-08-28 DIAGNOSIS — H9190 Unspecified hearing loss, unspecified ear: Secondary | ICD-10-CM | POA: Diagnosis not present

## 2022-08-28 DIAGNOSIS — M4800 Spinal stenosis, site unspecified: Secondary | ICD-10-CM | POA: Diagnosis not present

## 2022-08-28 DIAGNOSIS — E669 Obesity, unspecified: Secondary | ICD-10-CM | POA: Diagnosis not present

## 2022-08-28 DIAGNOSIS — I872 Venous insufficiency (chronic) (peripheral): Secondary | ICD-10-CM | POA: Diagnosis not present

## 2022-08-28 DIAGNOSIS — Z86718 Personal history of other venous thrombosis and embolism: Secondary | ICD-10-CM | POA: Diagnosis not present

## 2022-09-01 ENCOUNTER — Encounter (HOSPITAL_BASED_OUTPATIENT_CLINIC_OR_DEPARTMENT_OTHER): Payer: Medicare Other | Attending: General Surgery | Admitting: General Surgery

## 2022-09-01 DIAGNOSIS — I509 Heart failure, unspecified: Secondary | ICD-10-CM | POA: Diagnosis not present

## 2022-09-01 DIAGNOSIS — Z86718 Personal history of other venous thrombosis and embolism: Secondary | ICD-10-CM | POA: Diagnosis not present

## 2022-09-01 DIAGNOSIS — L97812 Non-pressure chronic ulcer of other part of right lower leg with fat layer exposed: Secondary | ICD-10-CM | POA: Diagnosis not present

## 2022-09-01 DIAGNOSIS — I251 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: Secondary | ICD-10-CM | POA: Insufficient documentation

## 2022-09-01 DIAGNOSIS — L942 Calcinosis cutis: Secondary | ICD-10-CM | POA: Diagnosis not present

## 2022-09-01 DIAGNOSIS — I872 Venous insufficiency (chronic) (peripheral): Secondary | ICD-10-CM | POA: Insufficient documentation

## 2022-09-01 DIAGNOSIS — Z09 Encounter for follow-up examination after completed treatment for conditions other than malignant neoplasm: Secondary | ICD-10-CM | POA: Diagnosis not present

## 2022-09-01 DIAGNOSIS — I739 Peripheral vascular disease, unspecified: Secondary | ICD-10-CM | POA: Insufficient documentation

## 2022-09-01 DIAGNOSIS — L97522 Non-pressure chronic ulcer of other part of left foot with fat layer exposed: Secondary | ICD-10-CM | POA: Diagnosis not present

## 2022-09-01 DIAGNOSIS — I110 Hypertensive heart disease with heart failure: Secondary | ICD-10-CM | POA: Insufficient documentation

## 2022-09-01 DIAGNOSIS — I890 Lymphedema, not elsewhere classified: Secondary | ICD-10-CM | POA: Insufficient documentation

## 2022-09-02 NOTE — Progress Notes (Signed)
JAKYREN, FLUEGGE ([id number]) [id number] Page 1 of 11 Visit Report for 09/01/2022 Arrival Information Details Patient Name: Date of Service: [name], [name] 09/01/2022 12:30 PM Medical Record Number: [id number] Patient Account Number: [id number] Date of Birth/Sex: Treating RN: [birth year] (72 y.o. [name]: PA [name], [place] Other Clinician: Referring Aneka Fagerstrom: Treating Rhiann Boucher/Extender: [name] in Treatment: 29 Visit Information History Since Last Visit Added or deleted any medications: [name] Patient Arrived: Wheel Chair Any new allergies or adverse reactions: [name] Arrival Time: 12:32 Had a fall or experienced change in [name] Accompanied By: self activities of daily living that may affect Transfer Assistance: None risk of falls: Patient Identification Verified: Yes Signs or symptoms of abuse/neglect since last visito [name] Secondary Verification Process Completed: Yes Hospitalized since last visit: [name] Patient Requires Transmission-Based Precautions: [name] Implantable device outside of the clinic excluding [name] Patient Has Alerts: Yes cellular tissue based products placed in the center Patient Alerts: R ABI= N/C, TBI= .75 since last visit: L ABI =N/C, TBI = .57 Has Dressing in Place as Prescribed: Yes Has Compression in Place as Prescribed: Yes Pain Present Now: [name] Electronic Signature(s) Signed: 09/01/2022 5:56:04 PM By: Baruch Gouty RN, BSN Entered By: Baruch Gouty on 09/01/2022 12:34:25 -------------------------------------------------------------------------------- Compression Therapy Details Patient Name: Date of Service: [name], [name] 09/01/2022 12:30 PM Medical Record Number: [id number] Patient Account Number: [id number] Date of Birth/Sex: Treating RN: Apr 02, 1951 (72 y.o. [name]: PA [name], [place] Other Clinician: Referring Maleiyah Releford: Treating Wanna Gully/Extender: [name] in Treatment: 29 Compression Therapy Performed for Wound Assessment: Wound #1 Left,Dorsal Foot Performed By: Clinician Baruch Gouty, RN Compression Type: Four Layer Post Procedure Diagnosis Same as Pre-procedure Electronic Signature(s) Signed: 09/01/2022 5:56:04 PM By: Baruch Gouty RN, BSN Entered By: Baruch Gouty on 09/01/2022 13:21:46 [name] ([id number]) [id number] Page 2 of 11 -------------------------------------------------------------------------------- Compression Therapy Details Patient Name: Date of Service: [name], [name] 09/01/2022 12:30 PM Medical Record Number: [id number] Patient Account Number: [id number] Date of Birth/Sex: Treating RN: 1951/06/25 (72 y.o. [name]: PA [name], [place] Other Clinician: Referring Shirleen Mcfaul: Treating Yuktha Kerchner/Extender: [name] in Treatment: 29 Compression Therapy Performed for Wound Assessment: Wound #3 Right,Medial Lower Leg Performed By: Clinician Baruch Gouty, RN Compression Type: Four Layer Post Procedure Diagnosis Same as Pre-procedure Electronic Signature(s) Signed: 09/01/2022 5:56:04 PM By: Baruch Gouty RN, BSN Entered By: Baruch Gouty on 09/01/2022 13:22:01 -------------------------------------------------------------------------------- Encounter Discharge Information Details Patient Name: Date of Service: [name], [name] 09/01/2022 12:30 PM Medical Record Number: [id number] Patient Account Number: [id number] Date of Birth/Sex: Treating RN: 11/17/1950 (72 y.o. [name]: PA [name], [place] Other Clinician: Referring Suhail Peloquin: Treating Hilda Rynders/Extender: [name] in Treatment: 29 Encounter Discharge Information Items Post Procedure Vitals Discharge Condition: Stable Temperature (F): 97.7 Ambulatory Status: Wheelchair Pulse (bpm): 59 Discharge  Destination: Home Respiratory Rate (breaths/min): 18 Transportation: Private Auto Blood Pressure (mmHg): 138/77 Accompanied By: self Schedule Follow-up Appointment: Yes Clinical Summary of Care: Patient Declined Electronic Signature(s) Signed: 09/01/2022 5:56:04 PM By: Baruch Gouty RN, BSN Entered By: Baruch Gouty on 09/01/2022 13:54:48 -------------------------------------------------------------------------------- Lower Extremity Assessment Details Patient Name: Date of Service: [name], [name] 09/01/2022 12:30 PM Medical Record Number: [id number] Patient Account Number: [id number] Date of Birth/Sex: Treating RN: [birth year] (72 y.o. [name] Primary Care Taimur Fier: PA [name], [place] Other Clinician: Referring Emylia Latella: Treating Laymon Stockert/Extender: Valeria Batman Weeks in Treatment: 29 Edema Assessment Assessed: [Left:  [name]] [Right: [name]] Edema: [Left: Yes] [Right: Yes] Calf [name] ([id number]) [id number] Page 3 of 11 Left: Right: Point of Measurement: From Medial Instep 42 cm 42.5 cm Ankle Left: Right: Point of Measurement: From Medial Instep 25 cm 27 cm Vascular Assessment Pulses: Dorsalis Pedis Palpable: [Left:[name]] [Right:Yes] Electronic Signature(s) Signed: 09/01/2022 5:56:04 PM By: Baruch Gouty RN, BSN Entered By: Baruch Gouty on 09/01/2022 12:44:13 -------------------------------------------------------------------------------- Multi Wound Chart Details Patient Name: Date of Service: [name] 09/01/2022 12:30 PM Medical Record Number: [id number] Patient Account Number: [id number] Date of Birth/Sex: Treating RN: [birth year] (72 y.o. M) Primary Care [name]: PA [name] Other Clinician: Referring Annalei Friesz: Treating Netra Postlethwait/Extender: [name], [name] in Treatment: 29 Vital Signs Height(in): 71 Pulse(bpm): 65 Weight(lbs): 67 Blood Pressure(mmHg): 138/77 Body Mass Index(BMI):  37.2 Temperature(F): 97.7 Respiratory Rate(breaths/min): 20 [1:Photos:] Left, Dorsal Foot Right, Medial Lower Leg Right, Anterior Lower Leg Wound Location: Gradually Appeared Gradually Appeared Gradually Appeared Wounding Event: Lymphedema Lymphedema Calcinosis Primary Etiology: Cataracts, Anemia, Lymphedema, Cataracts, Anemia, Lymphedema, Cataracts, Anemia, Lymphedema, Comorbid History: Congestive [name] Failure, Deep Vein Congestive [name] Failure, Deep Vein Congestive [name] Failure, Deep Vein Thrombosis, Hypertension, Peripheral Thrombosis, Hypertension, Peripheral Thrombosis, Hypertension, Peripheral Venous Disease Venous Disease Venous Disease 12/07/2021 12/07/2021 05/12/2022 Date Acquired: '29 29 16 '$ Weeks of Treatment: Open Open Open Wound Status: [name] [name] Wound Recurrence: 1.4x0.8x0.1 3.2x6.1x0.7 0.3x0.2x0.2 Measurements L x W x D (cm) 0.88 15.331 0.047 A (cm) : rea 0.088 10.732 0.009 Volume (cm) : 76.80% -187.00% 80.10% % Reduction in Area: 94.20% -402.40% 62.50% % Reduction in Volume: Full Thickness Without Exposed Full Thickness With Exposed Support Full Thickness Without Exposed Classification: Support Structures Structures Support Structures Large Medium Medium Exudate Amount: Serous Serosanguineous Serosanguineous Exudate Type: amber red, brown red, brown Exudate Color: Flat and Intact Distinct, outline attached Epibole Wound Margin: Medium (34-66%) Small (1-33%) None Present (0%) Granulation AmountROCKWELL, ZENTZ ([id number]) [id number] Page 4 of 11 Red, Pink Red, Pink N/A Granulation Quality: Medium (34-66%) Large (67-100%) None Present (0%) Necrotic Amount: Fat Layer (Subcutaneous Tissue): Yes Fat Layer (Subcutaneous Tissue): Yes Fat Layer (Subcutaneous Tissue): Yes Exposed Structures: Fascia: [name] Fascia: [name] Fascia: [name] Tendon: [name] Tendon: [name] Tendon: [name] Muscle: [name] Muscle: [name] Muscle: [name] Joint: [name] Joint: [name] Joint:  [name] Bone: [name] Bone: [name] Bone: [name] Small (1-33%) Small (1-33%) None Epithelialization: Debridement - Selective/Open Wound Debridement - Excisional N/A Debridement: Pre-procedure Verification/Time Out 13:10 13:10 N/A Taken: Lidocaine 4% Topical Solution Lidocaine 4% Topical Solution N/A Pain Control: Slough Other, Subcutaneous, Slough N/A Tissue Debrided: Non-Viable Tissue Skin/Subcutaneous Tissue N/A Level: 1.12 16 N/A Debridement A (sq cm): rea Curette Curette N/A Instrument: Minimum Minimum N/A Bleeding: Pressure Pressure N/A Hemostasis A chieved: 0 0 N/A Procedural Pain: 0 0 N/A Post Procedural Pain: Procedure was tolerated well Procedure was tolerated well N/A Debridement Treatment Response: 1.4x0.8x0.1 3.2x6.1x0.7 N/A Post Debridement Measurements L x W x D (cm) 0.088 10.732 N/A Post Debridement Volume: (cm) Excoriation: Yes Excoriation: Yes Excoriation: [name] Periwound Skin Texture: Induration: [name] Callus: [name] Crepitus: [name] Rash: [name] Scarring: [name] Maceration: [name] Maceration: [name] Maceration: [name] Periwound Skin Moisture: Dry/Scaly: [name] Dry/Scaly: [name] Dry/Scaly: [name] Hemosiderin Staining: Yes Hemosiderin Staining: Yes Hemosiderin Staining: Yes Periwound Skin Color: Erythema: [name] Erythema: [name] Atrophie Blanche: [name] Rubor: [name] Rubor: [name] Cyanosis: [name] Ecchymosis: [name] Erythema: [name] Mottled: [name] Pallor: [name] Rubor: [name] Hot [name] Abnormality [name] Abnormality Temperature: Yes Yes N/A Tenderness on Palpation: Compression Therapy Compression Therapy N/A Procedures Performed: Debridement Debridement Treatment Notes Electronic Signature(s) Signed: 09/01/2022  1:48:25 PM By: Fredirick Maudlin MD FACS Entered By: Fredirick Maudlin on 09/01/2022 13:48:25 -------------------------------------------------------------------------------- Multi-Disciplinary Care Plan Details Patient Name: Date of Service: [name], [name] 09/01/2022 12:30 PM Medical Record Number: [id number] Patient Account  Number: [id number] Date of Birth/Sex: Treating RN: [birth year] (72 y.o. [name] Primary Care Sharmon Cheramie: PA [name], [place] Other Clinician: Referring Nataya Bastedo: Treating Britlyn Martine/Extender: [name] in Treatment: Barrett reviewed with physician Active Inactive Venous Leg Ulcer Nursing Diagnoses: Knowledge deficit related to disease process and management Potential for venous Insuffiency (use before diagnosis confirmed) KAUAN, KLOOSTERMAN ([id number]) [id number] Page 5 of 11 Goals: Patient will maintain optimal edema control Date Initiated: 02/17/2022 Target Resolution Date: 09/29/2022 Goal Status: Active Interventions: Assess peripheral edema status every visit. Compression as ordered Provide education on venous insufficiency Treatment Activities: Non-invasive vascular studies : 02/17/2022 Therapeutic compression applied : 02/17/2022 Notes: Wound/Skin Impairment Nursing Diagnoses: Impaired tissue integrity Knowledge deficit related to ulceration/compromised skin integrity Goals: Patient/caregiver will verbalize understanding of skin care regimen Date Initiated: 02/17/2022 Target Resolution Date: 09/29/2022 Goal Status: Active Ulcer/skin breakdown will have a volume reduction of 30% by week 4 Date Initiated: 02/17/2022 Date Inactivated: 03/10/2022 Target Resolution Date: 03/10/2022 Unmet Reason: infection being treated, Goal Status: Unmet waiting on derm appte Ulcer/skin breakdown will have a volume reduction of 50% by week 8 Date Initiated: 03/10/2022 Date Inactivated: 04/14/2022 Target Resolution Date: 04/07/2022 Goal Status: Unmet Unmet Reason: calcinosis Ulcer/skin breakdown will have a volume reduction of 80% by week 12 Date Initiated: 04/14/2022 Date Inactivated: 05/05/2022 Target Resolution Date: 05/05/2022 Unmet Reason: infection, chronic Goal Status: Unmet condition Interventions: Assess  patient/caregiver ability to obtain necessary supplies Assess patient/caregiver ability to perform ulcer/skin care regimen upon admission and as needed Assess ulceration(s) every visit Provide education on ulcer and skin care Treatment Activities: Skin care regimen initiated : 02/17/2022 Topical wound management initiated : 02/17/2022 Notes: Electronic Signature(s) Signed: 09/01/2022 5:56:04 PM By: Baruch Gouty RN, BSN Entered By: Baruch Gouty on 09/01/2022 12:59:31 -------------------------------------------------------------------------------- Pain Assessment Details Patient Name: Date of Service: [name], [name] 09/01/2022 12:30 PM Medical Record Number: [id number] Patient Account Number: [id number] Date of Birth/Sex: Treating RN: 1951/03/22 (72 y.o. [name] Primary Care Onna Nodal: PA [name], [place] Other Clinician: Referring Darrion Wyszynski: Treating Asante Ritacco/Extender: [name] in Treatment: 29 Active Problems Location of Pain Severity and Description of Pain Patient Has Paino [name] Site Locations [place], [place] ([id number]) [id number] Page 6 of 11 Rate the pain. Current Pain Level: 0 Pain Management and Medication Current Pain Management: Electronic Signature(s) Signed: 09/01/2022 5:56:04 PM By: Baruch Gouty RN, BSN Entered By: Baruch Gouty on 09/01/2022 12:36:37 -------------------------------------------------------------------------------- Patient/Caregiver Education Details Patient Name: Date of Service: [name], [name] 2/7/2024andnbsp12:30 PM Medical Record Number: [id number] Patient Account Number: [id number] Date of Birth/Gender: Treating RN: 1950-11-03 (72 y.o. [name] Primary Care Physician: PA [name], [place] Other Clinician: Referring Physician: Treating Physician/Extender: [name] in Treatment: 29 Education Assessment Education Provided To: Patient Education Topics  Provided Venous: Methods: Explain/Verbal Responses: Reinforcements needed, State content correctly Wound/Skin Impairment: Methods: Explain/Verbal Responses: Reinforcements needed, State content correctly Electronic Signature(s) Signed: 09/01/2022 5:56:04 PM By: Baruch Gouty RN, BSN Entered By: Baruch Gouty on 09/01/2022 12:59:55 -------------------------------------------------------------------------------- Wound Assessment Details Patient Name: Date of Service: [name] 09/01/2022 12:30 PM [name] ([id number]) [id number] Page 7 of 11 Medical Record Number: [id number] Patient Account Number: [id number] Date of Birth/Sex: Treating RN: 03/21/1951 (72 y.o. [name] Primary Care Maricruz Lucero: PA [place],  [name] Other Clinician: Referring Jenille Laszlo: Treating Milissa Fesperman/Extender: [name], [name] in Treatment: 29 Wound Status Wound Number: 1 Primary Lymphedema Etiology: Wound Location: Left, Dorsal Foot Wound Open Wounding Event: Gradually Appeared Status: Date Acquired: 12/07/2021 Comorbid Cataracts, Anemia, Lymphedema, Congestive [name] Failure, Deep Weeks Of Treatment: 29 History: Vein Thrombosis, Hypertension, Peripheral Venous Disease Clustered Wound: [name] Photos Wound Measurements Length: (cm) 1.4 Width: (cm) 0.8 Depth: (cm) 0.1 Area: (cm) 0.88 Volume: (cm) 0.088 % Reduction in Area: 76.8% % Reduction in Volume: 94.2% Epithelialization: Small (1-33%) Tunneling: [name] Undermining: [name] Wound Description Classification: Full Thickness Without Exposed Suppor Wound Margin: Flat and Intact Exudate Amount: Large Exudate Type: Serous Exudate Color: amber t Structures Foul Odor After Cleansing: [name] Slough/Fibrino Yes Wound Bed Granulation Amount: Medium (34-66%) Exposed Structure Granulation Quality: Red, Pink Fascia Exposed: [name] Necrotic Amount: Medium (34-66%) Fat Layer (Subcutaneous Tissue) Exposed: Yes Necrotic  Quality: Adherent Slough Tendon Exposed: [name] Muscle Exposed: [name] Joint Exposed: [name] Bone Exposed: [name] Periwound Skin Texture Texture Color [name] Abnormalities Noted: [name] Abnormalities Noted: [name] Excoriation: Yes Erythema: [name] Hemosiderin Staining: Yes Moisture Rubor: [name] Abnormalities Noted: [name] Dry / Scaly: [name] Temperature / Pain Maceration: [name] Temperature: Hot Tenderness on Palpation: Yes Treatment Notes Wound #1 (Foot) Wound Laterality: Dorsal, Left Cleanser Peri-Wound Care Triamcinolone 15 (g) Discharge Instruction: Use triamcinolone 15 (g) ( in clinic) Zinc Oxide Ointment 30g tube Discharge Instruction: Apply Zinc Oxide to periwound with each dressing change as needed for excoriation RANDEL, HARGENS ([id number]) [id number] Page 8 of 11 Sween Lotion (Moisturizing lotion) Discharge Instruction: Apply moisturizing lotion as directed Topical Primary Dressing Sorbalgon AG Dressing, 4x4 (in/in) Discharge Instruction: or equivalent silver alginate. Apply to wound bed as instructed Secondary Dressing ABD Pad, 5x9 Discharge Instruction: Apply over primary dressing as directed. Woven Gauze Sponge, Non-Sterile 4x4 in Discharge Instruction: Apply over primary dressing as directed. Zetuvit Plus 4x8 in Discharge Instruction: Apply over primary dressing as directed. Secured With Compression Wrap FourPress (4 layer compression wrap) Discharge Instruction: Apply four layer compression as directed. May also use Urgo K2 compression system as alternative. Compression Stockings Add-Ons Electronic Signature(s) Signed: 09/01/2022 5:56:04 PM By: Baruch Gouty RN, BSN Entered By: Baruch Gouty on 09/01/2022 12:57:51 -------------------------------------------------------------------------------- Wound Assessment Details Patient Name: Date of Service: [name], [name] 09/01/2022 12:30 PM Medical Record Number: [id number] Patient Account Number: [id number] Date of  Birth/Sex: Treating RN: 1950-08-03 (72 y.o. [name] Primary Care Tieara Flitton: PA [name], [place] Other Clinician: Referring Tirsa Gail: Treating Eudelia Hiltunen/Extender: [name] in Treatment: 29 Wound Status Wound Number: 3 Primary Lymphedema Etiology: Wound Location: Right, Medial Lower Leg Wound Open Wounding Event: Gradually Appeared Status: Date Acquired: 12/07/2021 Comorbid Cataracts, Anemia, Lymphedema, Congestive [name] Failure, Deep Weeks Of Treatment: 29 History: Vein Thrombosis, Hypertension, Peripheral Venous Disease Clustered Wound: [name] Photos Wound Measurements Length: (cm) 3.2 Width: (cm) 6.1 Depth: (cm) 0.7 Mattern, Jamale ([id number]) Area: (cm) 15.331 Volume: (cm) 10.732 % Reduction in Area: -187% % Reduction in Volume: -402.4% Epithelialization: Small (1-33%) [id number] Page 9 of 11 Tunneling: [name] Undermining: [name] Wound Description Classification: Full Thickness With Exposed Suppo Wound Margin: Distinct, outline attached Exudate Amount: Medium Exudate Type: Serosanguineous Exudate Color: red, brown rt Structures Foul Odor After Cleansing: [name] Slough/Fibrino Yes Wound Bed Granulation Amount: Small (1-33%) Exposed Structure Granulation Quality: Red, Pink Fascia Exposed: [name] Necrotic Amount: Large (67-100%) Fat Layer (Subcutaneous Tissue) Exposed: Yes Necrotic Quality: Adherent Slough Tendon Exposed: [name] Muscle Exposed: [name] Joint Exposed: [name] Bone Exposed: [name] Periwound Skin Texture Texture  Color [name] Abnormalities Noted: [name] Abnormalities Noted: [name] Excoriation: Yes Erythema: [name] Hemosiderin Staining: Yes Moisture Rubor: [name] Abnormalities Noted: [name] Dry / Scaly: [name] Temperature / Pain Maceration: [name] Temperature: [name] Abnormality Tenderness on Palpation: Yes Treatment Notes Wound #3 (Lower Leg) Wound Laterality: Right, Medial Cleanser Peri-Wound Care Triamcinolone 15 (g) Discharge Instruction: Use  triamcinolone 15 (g) as directed Zinc Oxide Ointment 30g tube Discharge Instruction: Apply Zinc Oxide to periwound with each dressing change Sween Lotion (Moisturizing lotion) Discharge Instruction: Apply moisturizing lotion as directed Topical Primary Dressing Santyl Ointment Discharge Instruction: Apply nickel thick amount to wound bed as instructed sodium thiosulfate ointment Discharge Instruction: 1:1 mix with santyl to wound bed Secondary Dressing ABD Pad, 8x10 Discharge Instruction: Apply over primary dressing as directed. Woven Gauze Sponge, Non-Sterile 4x4 in Discharge Instruction: Apply over primary dressing as directed. Zetuvit Plus 4x8 in Discharge Instruction: Apply over primary dressing as directed. Secured With Compression Wrap FourPress (4 layer compression wrap) Discharge Instruction: Apply four layer compression as directed. May also use Urgo K2 compression system as alternative. Compression Stockings Add-Ons Electronic Signature(s) Signed: 09/01/2022 5:56:04 PM By: Baruch Gouty RN, BSN Entered By: Baruch Gouty on 09/01/2022 12:58:34 [name] ([id number]) [id number] Page 10 of 11 -------------------------------------------------------------------------------- Wound Assessment Details Patient Name: Date of Service: [name], [name] 09/01/2022 12:30 PM Medical Record Number: [id number] Patient Account Number: [id number] Date of Birth/Sex: Treating RN: October 22, 1950 (72 y.o. [name] Primary Care Kathalina Ostermann: PA [name], [place] Other Clinician: Referring Tannie Koskela: Treating Paiden Cavell/Extender: Valeria Batman Weeks in Treatment: 29 Wound Status Wound Number: 6 Primary Calcinosis Etiology: Wound Location: Right, Anterior Lower Leg Wound Open Wounding Event: Gradually Appeared Status: Date Acquired: 05/12/2022 Comorbid Cataracts, Anemia, Lymphedema, Congestive [name] Failure, Deep Weeks Of Treatment: 16 History: Vein  Thrombosis, Hypertension, Peripheral Venous Disease Clustered Wound: [name] Photos Wound Measurements Length: (cm) 0. Width: (cm) 0. Depth: (cm) 0. Area: (cm) 0 Volume: (cm) 0 3 % Reduction in Area: 80.1% 2 % Reduction in Volume: 62.5% 2 Epithelialization: None .047 Tunneling: [name] .009 Undermining: [name] Wound Description Classification: Full Thickness Without Exposed Support Wound Margin: Epibole Exudate Amount: Medium Exudate Type: Serosanguineous Exudate Color: red, brown Structures Foul Odor After Cleansing: [name] Slough/Fibrino Yes Wound Bed Granulation Amount: None Present (0%) Exposed Structure Necrotic Amount: None Present (0%) Fascia Exposed: [name] Fat Layer (Subcutaneous Tissue) Exposed: Yes Tendon Exposed: [name] Muscle Exposed: [name] Joint Exposed: [name] Bone Exposed: [name] Periwound Skin Texture Texture Color [name] Abnormalities Noted: Yes [name] Abnormalities Noted: [name] Atrophie Blanche: [name] Moisture Cyanosis: [name] Abnormalities Noted: Yes Ecchymosis: [name] Erythema: [name] Hemosiderin Staining: Yes Mottled: [name] Pallor: [name] Rubor: [name] Temperature / Pain Flaum, Vyron ([id number]) [id number] Page 11 of 11 Temperature: [name] Abnormality Treatment Notes Wound #6 (Lower Leg) Wound Laterality: Right, Anterior Cleanser Peri-Wound Care Triamcinolone 15 (g) Discharge Instruction: Use triamcinolone 15 (g) ( in clinic) Zinc Oxide Ointment 30g tube Discharge Instruction: Apply Zinc Oxide to periwound with each dressing change as needed for excoriation Sween Lotion (Moisturizing lotion) Discharge Instruction: Apply moisturizing lotion as directed Topical Primary Dressing Sorbalgon AG Dressing, 4x4 (in/in) Discharge Instruction: or equivalent silver alginate. Apply to wound bed as instructed Secondary Dressing ABD Pad, 5x9 Discharge Instruction: Apply over primary dressing as directed. Woven Gauze Sponge, Non-Sterile 4x4 in Discharge Instruction: Apply over primary  dressing as directed. Zetuvit Plus 4x8 in Discharge Instruction: Apply over primary dressing as directed. Secured With Compression Wrap FourPress (4 layer compression wrap) Discharge Instruction: Apply four layer compression as directed.  May also use Urgo K2 compression system as alternative. Compression Stockings Add-Ons Electronic Signature(s) Signed: 09/01/2022 5:56:04 PM By: Baruch Gouty RN, BSN Entered By: Baruch Gouty on 09/01/2022 12:59:09 -------------------------------------------------------------------------------- Vitals Details Patient Name: Date of Service: [name] 09/01/2022 12:30 PM Medical Record Number: [id number] Patient Account Number: [id number] Date of Birth/Sex: Treating RN: [birth year] (72 y.o. [name] Primary Care Ashana Tullo: PA [name], [place] Other Clinician: Referring Coree Riester: Treating Blayklee Mable/Extender: [name] in Treatment: 29 Vital Signs Time Taken: 12:36 Temperature (F): 97.7 Height (in): 71 Pulse (bpm): 59 Weight (lbs): 267 Respiratory Rate (breaths/min): 20 Body Mass Index (BMI): 37.2 Blood Pressure (mmHg): 138/77 Reference Range: 80 - 120 mg / dl Electronic Signature(s) Signed: 09/01/2022 5:56:04 PM By: Baruch Gouty RN, BSN Entered By: Baruch Gouty on 09/01/2022 12:36:24

## 2022-09-02 NOTE — Progress Notes (Signed)
[name] ([id number]) [id number] Page 1 of 15 Visit Report for 09/01/2022 Chief Complaint Document Details Patient Name: Date of Service: [name], [name] 09/01/2022 12:30 PM Medical Record Number: [id number] Patient Account Number: [id number] Date of Birth/Sex: Treating RN: July 25, 1951 (72 y.o. M) Primary Care Provider: PA Haig [name], NO Other Clinician: Referring Provider: Treating Provider/Extender: [name] in Treatment: 29 Information Obtained from: Patient Chief Complaint Patient seen for complaints of Non-Healing Wounds. Electronic Signature(s) Signed: 09/01/2022 1:50:31 PM By: [name] Maudlin MD FACS Entered By: [name] on 09/01/2022 13:50:31 -------------------------------------------------------------------------------- Debridement Details Patient Name: Date of Service: [name], [name] 09/01/2022 12:30 PM Medical Record Number: [id number] Patient Account Number: [id number] Date of Birth/Sex: Treating RN: [birth year] (72 y.o. [name] Primary Care Provider: PA Haig [name], [place] Other Clinician: Referring Provider: Treating Provider/Extender: [name] in Treatment: 29 Debridement Performed for Assessment: Wound #1 Left,Dorsal Foot Performed By: Physician [name] Maudlin, MD Debridement Type: Debridement Level of Consciousness (Pre-procedure): Awake and Alert Pre-procedure Verification/Time Out Yes - 13:10 Taken: Start Time: 13:12 Pain Control: Lidocaine 4% T opical Solution T Area Debrided (L x W): otal 1.4 (cm) x 0.8 (cm) = 1.12 (cm) Tissue and other material debrided: Non-Viable, Slough, Slough Level: Non-Viable Tissue Debridement Description: Selective/Open Wound Instrument: Curette Bleeding: Minimum Hemostasis Achieved: Pressure Procedural Pain: 0 Post Procedural Pain: 0 Response to Treatment: Procedure was tolerated well Level of Consciousness (Post- Awake and  Alert procedure): Post Debridement Measurements of Total Wound Length: (cm) 1.4 Width: (cm) 0.8 Depth: (cm) 0.1 Volume: (cm) 0.088 Character of Wound/Ulcer Post Debridement: Improved Post Procedure Diagnosis Same as [name] ([id number]) [id number] Page 2 of 15 Notes Scribed for [name] by [name] Gouty, RN Electronic Signature(s) Signed: 09/01/2022 5:26:12 PM By: [name] Maudlin MD FACS Signed: 09/01/2022 5:56:04 PM By: [name] Gouty RN, BSN Entered By: [name] on 09/01/2022 13:17:23 -------------------------------------------------------------------------------- Debridement Details Patient Name: Date of Service: [name], [name] 09/01/2022 12:30 PM Medical Record Number: [id number] Patient Account Number: [id number] Date of Birth/Sex: Treating RN: 1951/06/30 (72 y.o. [name] Primary Care Provider: PA Haig [name], [place] Other Clinician: Referring Provider: Treating Provider/Extender: [name] in Treatment: 29 Debridement Performed for Assessment: Wound #3 Right,Medial Lower Leg Performed By: Physician [name] Maudlin, MD Debridement Type: Debridement Level of Consciousness (Pre-procedure): Awake and Alert Pre-procedure Verification/Time Out Yes - 13:10 Taken: Start Time: 13:12 Pain Control: Lidocaine 4% T opical Solution T Area Debrided (L x W): otal 3.2 (cm) x 5 (cm) = 16 (cm) Tissue and other material debrided: Viable, Non-Viable, Slough, Subcutaneous, Slough, Other: caLCIUM DEPOSITS Level: Skin/Subcutaneous Tissue Debridement Description: Excisional Instrument: Curette Bleeding: Minimum Hemostasis Achieved: Pressure Procedural Pain: 0 Post Procedural Pain: 0 Response to Treatment: Procedure was tolerated well Level of Consciousness (Post- Awake and Alert procedure): Post Debridement Measurements of Total Wound Length: (cm) 3.2 Width: (cm) 6.1 Depth: (cm) 0.7 Volume: (cm)  10.732 Character of Wound/Ulcer Post Debridement: Improved Post Procedure Diagnosis Same as Pre-procedure Notes Scribed for [name] by [name] Gouty, RN Electronic Signature(s) Signed: 09/01/2022 5:26:12 PM By: [name] Maudlin MD FACS Signed: 09/01/2022 5:56:04 PM By: [name] Gouty RN, BSN Entered By: [name] on 09/01/2022 13:19:26 HPI Details -------------------------------------------------------------------------------- [name] ([id number]) [id number] Page 3 of 15 Patient Name: Date of Service: [name], [name] 09/01/2022 12:30 PM Medical Record Number: [id number] Patient Account Number: [id number] Date of Birth/Sex: Treating RN: December 27, 1950 (72 y.o. M) Primary Care Provider: PA Haig [name], NO Other Clinician: Referring Provider: Treating Provider/Extender: [name],  [name], [name] in Treatment: 29 History of Present Illness HPI Description: ADMISSION 02/09/2022 This is a 72 year old man who recently moved to [place] from [place]. He has a history of of coronary artery disease and prior left popliteal vein DVT . He is not diabetic and does not smoke. 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 He has had multiple spinal surgeries and has limited mobility. He primarily uses a motorized wheelchair. He has had lymphedema for many years and does have lymphedema pumps although he says he does not use them frequently. He has wounds on his bilateral lower extremities. He was receiving care in [place] for these prior to his move. They were apparently packing his wounds with Betadine soaked gauze. He has worn compression wraps in the past but not recently. He did say that they helped tremendously. In clinic today, his right ABI is noncompressible but has good signals; the left ABI was 1.17. No formal vascular studies have been performed. On his left dorsal foot, there is a circular lesion that exposes the fat layer. There is fairly  heavy slough accumulation within the wound, but no significant odor. On his right medial calf, he has multiple pinholes that have slough buildup in them and probe for about 2 to 4 mm in depth. They are draining clear fluid. On his right lateral midfoot, he has ulceration consistent with venous stasis. It is geographic and has slough accumulation. 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 02/17/2022: All of the wounds are little bit larger today and have more slough accumulation. Today, the medial calf openings are larger and probe to something that feels calcified. There is odor coming from his wounds. His vascular studies are scheduled for August 9 and 10. 02-24-2022 upon evaluation today patient presents for follow-up concerning his ongoing issues here with his lower extremities. With that being said he does have significant problems with what appears to be cellulitis unfortunately the PCR culture that was sent last week got crushed by UPS in transit. With that being said we are going to reobtain a culture today although I am actually to do it on the right medial leg as this seems to be the most inflamed area today where there is some questionable drainage as well. I am going to remove some of the calcium deposits which are loosening obtain a deeper culture from this area. Fortunately I do not see any evidence of active infection systemically which is good news but locally I think we are having a bigger issue here. He does have his appointment next Thursday with vascular. Patient has also been referred to [facility] for further evaluation and treatment of the calcinosis for possible sulfate injections. 03/04/2022: The culture that was performed last week grew out multiple species including Klebsiella, Morganella, and Pseudomonas aeruginosa. He had been prescribed Bactrim but this was not adequate to cover all of  the species and levofloxacin was recommended. He completed the Bactrim but did not pick up the levofloxacin and begin taking it until yesterday. 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 He had arterial studies performed today. He does have evidence of peripheral arterial disease. Results copied here: +-------+-----------+-----------+------------+------------+ ABI/TBIT oday's ABIT oday's TBIPrevious ABIPrevious TBI +-------+-----------+-----------+------------+------------+ Right [place] 0.75    +-------+-----------+-----------+------------+------------+  Left  0.57    +-------+-----------+-----------+------------+------------+ Arterial wall calcification precludes accurate ankle pressures and ABIs. Summary: Right: Resting right ankle-brachial index indicates noncompressible right lower extremity arteries. The right toe-brachial index is normal. Left: Resting left ankle-brachial index indicates noncompressible left lower extremity arteries. The left toe-brachial index is abnormal. He continues to have further tissue breakdown at the right medial calf and there is ample slough accumulation along with necrotic subcutaneous tissue. The left dorsal foot wound has built up slough, as well. The right medial foot wound is nearly closed with just a light layer of eschar over the surface. The right dorsal lateral foot wound has also accumulated a fair amount of slough and remains quite tender. 03/10/2022: He has been taking the prescribed levofloxacin and has about 3 days left of this. The erythema and induration on his right leg has improved. He continues to experience further breakdown of the right medial calf wound. There is extensive slough and debris accumulation there. There is heavy slough on his left dorsal foot  wound, but no odor or purulent drainage. The right medial foot wound appears to be closed. The right dorsal lateral foot wound also has a fair amount of slough but it is less tender than at our last visit. He still does not have an appointment with dermatology. 03/22/2022: The right anterior tibial wound has closed. The right lateral foot wound is nearly closed with just a little bit of slough. The left dorsal foot wound is smaller and continues to have some slough buildup but less so than at prior visits. There is good granulation tissue underlying the slough. Unfortunately the right medial calf wound continues to deteriorate. It has a foul odor today and a lot of necrotic tissue, the surrounding skin is also erythematous and moist. 03/30/2022: The right lateral foot wound continues to contract and just has a little bit of slough and eschar present. The left dorsal foot wound is also smaller with slough overlying good granulation tissue. The right medial calf wound actually looks quite a bit better today; there is no odor and there is much less necrotic tissue although some remains present. We have been using topical gentamicin and he has been taking oral Augmentin. 04/07/2022: The patient saw dermatology at [facility] last week. Unfortunately, it appears that they did not read any of the documentation that was provided. 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 Overall, it was a highly unsatisfactory consultation. In the meantime, however, we were able to identify compounding pharmacy who could create a topical sodium thiosulfate ointment. The patient has that with him today. Both of the right foot wounds are now closed. The left dorsal  foot wound has contracted but still has a layer of slough on the surface. The medial calf wounds on the right all look a little bit better. They still have heavy slough along with the chunks of calcium. Everything is stained purple. 04/14/2022: The wound on his dorsal left foot is a little bit smaller again today. There is still slough accumulation on the surface. The medial calf wounds have quite a bit less slough accumulation today. His right leg, however, is warm and erythematous, concerning for cellulitis. AMAIR, SHROUT ([id number]) [id number] Page 4 of 15 04/14/2022: He completed his  course of Augmentin yesterday. The medial calf wound sites are much cleaner today and shallower. There is less fragmented calcium in the wounds. He has a lot of lymphedema fluid-like drainage from these wounds, but no purulent discharge or malodor. The wound on his dorsal left foot has also contracted and is shallower. There is a layer of slough over healthy granulation tissue. 05/05/2022: The left dorsal foot wound is smaller today with less slough and more granulation tissue. The right medial calf wounds are shallower, but have extensive slough and some fat necrosis. The drainage has diminished considerably, however. 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 05/12/2022: The left dorsal foot wound continues to contract and fill with granulation tissue. There is slough on the wound surface. The right medial calf wounds also continue to fill with healthy tissue, but there is remaining slough and fat necrosis present. He had a significant increase in his drainage this week and it was a bright yellow-green color. He also had a new wound open over his right anterior tibial surface associated with the spike of cutaneous calcium. The leg is more red, as well, concerning for  infection. 05/19/2022: His culture returned positive for Pseudomonas. He is currently taking a course of oral levofloxacin. We have also been using topical gentamicin mixed in with his sodium thiosulfate. All of the wounds look better this week. 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 There is slough and nonviable subcutaneous tissue still present. The new anterior tibial wound just has a light layer of slough. The dorsal foot wound also has some slough present and appears a little dry today. 05/26/2022: The right medial leg wound continues to improve. It is feeling with granulation tissue, but still accumulates a fairly thick layer of slough on the surface. The more recent anterior tibial wound has remained quite small, but also has some slough on the surface. The left dorsal foot wound has contracted somewhat and has perimeter epithelialization. He completed his oral levofloxacin. 06/02/2022: The left dorsal foot wound continues to improve significantly. There is just a little slough on the wound surface. The right medial leg wound had black drainage, but it looks like silver alginate was applied last week and I theorized that silver sulfide was created with a combination of the silver alginate and the sodium thiosulfate. It did, however, have a bit of an odor to it and extensive slough accumulation. The small anterior tibial wound also had a bit of slough and nonviable subcutaneous tissue present. The skin of his leg was rather macerated, as well. 06/09/2022: The left dorsal foot wound is smaller again this week with just a light layer of slough on the surface. The right medial leg wound looks substantially better. The leg and periwound skin are no longer red and macerated. There is good granulation tissue forming with less slough and nonviable tissue. The anterior tibial wound just has a small amount of slough accumulation. 06/16/2022: The left dorsal foot wound  continues to contract. His wrap slipped a little bit, however, and his foot is more swollen. The right medial leg wound continues to improve. The underlying tissue is more vital-appearing and there is less slough. He does have substantial drainage. The small anterior tibial wound has a bit of slough accumulation. 06/23/2022: The left dorsal foot wound is about half the size as it was last week. There  is just some light slough on the surface and some eschar buildup around the edges. The right anterior leg wound is small with a little slough on the surface. Unfortunately, the right medial leg wound deteriorated fairly substantially. It is malodorous with gray/green/black discoloration. 06/30/2022: The left dorsal foot wound continues to contract. It is nearly closed with just a light layer of slough present. The right anterior leg wound is about the same size and has a small amount of slough on the surface. The right medial leg wound looks a lot better than it did last week. The odor has abated and the tissue is less pulpy and necrotic-looking. There is still a fairly thick layer of slough present. 12/13; the left dorsal foot wound looks very healthy. There was no need to change the dressing here and no debridement. He has a small area on the right anterior lower leg apparently had not changed much in size. The area on the medial leg is the most problematic area this is large with some depth and a completely nonviable surface today. 07/14/2022: The left dorsal foot wound is smaller today with just a little slough and eschar present. The right anterior lower leg wound is about the same without any significant change. The medial leg wound has a black and green surface and is malodorous. No purulent drainage and the periwound skin is intact. Edema control is suboptimal. 07/21/2022: The dorsal foot wound continues to contract and is nearly closed with just a little bit of slough and eschar on the surface.  The right anterior lower leg wound is shallower today but otherwise the dimensions are unchanged. The medial leg wound looks significantly better although it still has a nonviable surface; the odor and black/green surface has resolved. His culture came back with multiple species sensitive to Augmentin, which was prescribed and a very low level of Pseudomonas, which should be handled by the topical gentamicin we have been using. 07/28/2022: The dorsal foot wound is down to just a very tiny opening with a little eschar on the surface. The right anterior tibial wound is about the same with some slough buildup. The right medial leg wound looks quite a bit better this week. There is thick rubbery slough on the surface, but the odor and drainage has improved significantly. 08/04/2022: The dorsal foot wound is almost healed, but the periwound skin has broken down. This appears to be secondary to moisture and adhesive from the absorbent pad. The right anterior tibial wound is shallower. The right medial leg wound continues to improve. It is smaller, still with rubbery slough on the surface. No malodor or purulent drainage. 08/11/2022: The dorsal foot wound is about the same size. It is a little bit dry with some slough accumulation. The right anterior tibial wound is also unchanged. The right medial leg wound is filling in further with good granulation tissue. Still with some slough buildup on the surface. 08/18/2022: His left leg is bright red, hot, and swollen with cellulitis. The dorsal foot wound actually looks quite good and is superficial with just a little bit of slough accumulation. The right anterior tibial wound is unchanged. The right medial leg wound has filled in with good granulation tissue but has copious amounts of drainage. 08/25/2022: His cellulitis has responded nicely to the oral antibiotics. His dorsal foot wound is smaller but has a fair amount of slough buildup. The right anterior tibial  wound remains stable and unchanged. The right medial leg wound has more granulation tissue  under a layer of slough, but continues to have copious drainage. The drainage is actually causing skin irritation and threatens to result in breakdown. 09/01/2022: He continues to have profuse drainage from his wounds which is resulting in tissue maceration on both the dorsum of his left foot and the periwound distal to his medial leg wound. The dorsal foot wound is nearly healed, despite this. The medial right leg wound is filling in with good granulation tissue. We are working on getting home health assistance to change his dressings more frequently to try and control the drainage better. Electronic Signature(s) Signed: 09/01/2022 1:53:04 PM By: [name] Maudlin MD FACS Entered By: [name] on 09/01/2022 13:53:03 [name] ([id number]) [id number] Page 5 of 15 -------------------------------------------------------------------------------- Physical Exam Details Patient Name: Date of Service: [name], [name] 09/01/2022 12:30 PM Medical Record Number: [id number] Patient Account Number: [id number] Date of Birth/Sex: Treating RN: Jul 11, 1951 (72 y.o. M) Primary Care Provider: PA Haig [name], NO Other Clinician: Referring Provider: Treating Provider/Extender: [name], [name] in Treatment: 29 Constitutional . Slightly bradycardic. . . no acute distress. Respiratory Normal work of breathing on room air. Notes 09/01/2022: He continues to have profuse drainage from his wounds which is resulting in tissue maceration on both the dorsum of his left foot and the periwound distal to his medial leg wound. The dorsal foot wound is nearly healed, despite this. The medial right leg wound is filling in with good granulation tissue. Electronic Signature(s) Signed: 09/01/2022 1:53:30 PM By: [name] Maudlin MD FACS Entered By: [name] on 09/01/2022  13:53:30 -------------------------------------------------------------------------------- Physician Orders Details Patient Name: Date of Service: [name], [name] 09/01/2022 12:30 PM Medical Record Number: [id number] Patient Account Number: [id number] Date of Birth/Sex: Treating RN: 09-Feb-1951 (72 y.o. 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 - [name] Rm 1 Return A Wednesday 2/14 @ 1230 pm Anesthetic Wound #1 Left,Dorsal Foot (In clinic) Topical Lidocaine 4% applied to wound bed Wound #3 Right,Medial Lower Leg (In clinic) Topical Lidocaine 4% applied to wound bed Wound #6 Right,Anterior Lower Leg (In clinic) Topical Lidocaine 4% applied to wound bed Seel, [name] ([id number]) [id number] Page 6 of 15 Bathing/ Shower/ Hygiene May shower with protection but do not get wound dressing(s) wet. Protect dressing(s) with water repellant cover (for example, large plastic bag) or a cast cover and may then take shower. Edema Control - Lymphedema / SCD / Other Lymphedema Pumps. Use Lymphedema pumps on leg(s) 2-3 times a day for 45-60 minutes. If wearing any wraps or hose, do not remove them. Continue exercising as instructed. - use 1-2 times per  day over wraps Elevate legs to the level of the heart or above for 30 minutes daily and/or when sitting for 3-4 times a day throughout the day. - throughout the day Avoid standing for long periods of time. Exercise regularly [place] to [place] for skilled nursing wound care. May utilize formulary equivalent dressing  for wound treatment orders unless otherwise specified. New wound care orders this week; continue Home Health for wound care. May utilize formulary equivalent dressing for wound treatment orders unless otherwise specified. Dressing changes to be completed by [place] on Monday / Wednesday / Friday except when patient has scheduled visit at [facility]. - pt seen in clinic on Wednesday Other [place] Orders/Instructions: - [facility] Wound Treatment Wound #1 - Foot Wound Laterality: Dorsal, Left Peri-Wound Care: Triamcinolone 15 (g) 3 x Per Week/30 Days Discharge Instructions: Use triamcinolone 15 (g) ( in clinic) Peri-Wound Care: Zinc Oxide Ointment 30g tube 3 x Per Week/30 Days Discharge Instructions: Apply Zinc Oxide to periwound with each dressing change as needed for excoriation Peri-Wound Care: Sween Lotion (Moisturizing lotion) 3 x Per Week/30 Days Discharge Instructions: Apply moisturizing lotion as directed Prim Dressing: Sorbalgon AG Dressing, 4x4 (in/in) (Generic) 3 x Per Week/30 Days ary Discharge Instructions: or equivalent silver alginate. Apply to wound bed as instructed Secondary Dressing: ABD Pad, 5x9 (Generic) 3 x Per Week/30 Days Discharge Instructions: Apply over primary dressing as directed. Secondary Dressing: Woven Gauze Sponge, Non-Sterile 4x4 in (Generic) 3 x Per Week/30 Days Discharge Instructions: Apply over primary dressing as directed. Secondary Dressing: Zetuvit Plus 4x8 in (Generic) 3 x Per Week/30 Days Discharge Instructions: Apply over primary dressing as directed. Compression Wrap: FourPress (4 layer compression wrap)  (Generic) 3 x Per Week/30 Days Discharge Instructions: Apply four layer compression as directed. May also use Urgo K2 compression system as alternative. Wound #3 - Lower Leg Wound Laterality: Right, Medial Peri-Wound Care: Triamcinolone 15 (g) 1 x Per Week/30 Days Discharge Instructions: Use triamcinolone 15 (g) as directed Peri-Wound Care: Zinc Oxide Ointment 30g tube 1 x Per Week/30 Days Discharge Instructions: Apply Zinc Oxide to periwound with each dressing change Peri-Wound Care: Sween Lotion (Moisturizing lotion) 1 x Per Week/30 Days Discharge Instructions: Apply moisturizing lotion as directed Prim Dressing: Santyl Ointment 1 x Per Week/30 Days ary Discharge Instructions: Apply nickel thick amount to wound bed as instructed Prim Dressing: sodium thiosulfate ointment 1 x Per Week/30 Days ary Discharge Instructions: 1:1 mix with santyl to wound bed Secondary Dressing: ABD Pad, 8x10 (Generic) 1 x Per Week/30 Days Discharge Instructions: Apply over primary dressing as directed. Secondary Dressing: Woven Gauze Sponge, Non-Sterile 4x4 in (Generic) 1 x Per Week/30 Days Discharge Instructions: Apply over primary dressing as directed. Secondary Dressing: Zetuvit Plus 4x8 in (Generic) 1 x Per Week/30 Days Discharge Instructions: Apply over primary dressing as directed. Compression Wrap: FourPress (4 layer compression wrap) (Generic) 1 x Per Week/30 Days Discharge Instructions: Apply four layer compression as directed. May also use Urgo K2 compression system as alternative. Wound #6 - Lower Leg Wound Laterality: Right, Anterior Peri-Wound Care: Triamcinolone 15 (g) 3 x Per Week/30 Days [name], [name] ([id number]) [id number] Page 7 of 15 Discharge Instructions: Use triamcinolone 15 (g) ( in clinic) Peri-Wound Care: Zinc Oxide Ointment 30g tube 3 x Per Week/30 Days Discharge Instructions: Apply Zinc Oxide to periwound with each dressing change as needed for  excoriation Peri-Wound Care: Sween Lotion (Moisturizing lotion) 3 x Per Week/30 Days Discharge Instructions: Apply moisturizing lotion as directed Prim Dressing: Sorbalgon AG Dressing, 4x4 (in/in) (Generic) 3 x Per Week/30 Days ary Discharge Instructions: or equivalent silver alginate. Apply to wound bed as instructed Secondary Dressing: ABD Pad, 5x9 (Generic) 3 x Per Week/30 Days Discharge Instructions: Apply over primary dressing as directed. Secondary Dressing: Woven Gauze Sponge, Non-Sterile 4x4 in (Generic) 3 x Per Week/30  Days Discharge Instructions: Apply over primary dressing as directed. Secondary Dressing: Zetuvit Plus 4x8 in (Generic) 3 x Per Week/30 Days Discharge Instructions: Apply over primary dressing as directed. Compression Wrap: FourPress (4 layer compression wrap) (Generic) 3 x Per Week/30 Days Discharge Instructions: Apply four layer compression as directed. May also use Urgo K2 compression system as alternative. Patient Medications llergies: No Known Allergies A Notifications Medication Indication Start End 09/01/2022 Santyl DOSE topical 250 unit/gram ointment - Apply to wound with dressing changes as directed Electronic Signature(s) Signed: 09/01/2022 5:26:12 PM By: [name] Maudlin MD FACS Previous Signature: 09/01/2022 1:58:40 PM Version By: [name] Maudlin MD FACS Entered By: [name] on 09/01/2022 14:00:02 -------------------------------------------------------------------------------- Problem List Details Patient Name: Date of Service: [name], [name] 09/01/2022 12:30 PM Medical Record Number: [id number] Patient Account Number: [id number] Date of Birth/Sex: Treating RN: 06-14-1951 (72 y.o. 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) 02/09/2022 No Yes Goll, [name] ([id number]) [id number] Page 8 of 15 I10 Essential (primary) hypertension 02/09/2022 No Yes I25.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 02/09/2022 No Yes I89.0 Lymphedema, not elsewhere classified 02/09/2022 No Yes Z86.718 Personal history of other venous thrombosis and embolism 02/09/2022 No Yes Inactive Problems ICD-10 Code Description Active Date Inactive Date L97.512 Non-pressure chronic ulcer of other part of right foot with fat layer exposed 02/17/2022 02/17/2022 Resolved Problems Electronic Signature(s) Signed: 09/01/2022 1:48:19 PM By: [name] Maudlin MD FACS Entered By: [name] on 09/01/2022 13:48:18 -------------------------------------------------------------------------------- Progress Note Details Patient Name: Date of Service: [name] 09/01/2022 12:30 PM Medical Record Number: [id number] Patient Account Number: [id number] Date of Birth/Sex: Treating RN: Sep 07, 1950 (72 y.o. M) Primary Care Provider: PA Haig [name], NO Other Clinician: Referring Provider: Treating Provider/Extender: [name] in Treatment: 29 Subjective Chief Complaint Information obtained from Patient Patient seen for complaints of Non-Healing Wounds. History of Present Illness (HPI) ADMISSION 02/09/2022 This is a 72 year old man who recently moved to [place] from [place]. He has a history of of coronary artery disease and prior left popliteal vein DVT . He is not diabetic and does not smoke. 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 He has had multiple spinal surgeries  and has limited mobility. He primarily uses a motorized wheelchair. He has had lymphedema for many years and does have lymphedema pumps although he says he does not use them frequently. He has wounds on his bilateral lower extremities. He was receiving care in [place] for these prior to his move. They were apparently packing his wounds with Betadine soaked gauze. He has worn compression wraps in the past but not recently. He did say that they helped tremendously. In clinic today, his right ABI is noncompressible but has good signals; the left ABI was 1.17. No formal vascular studies have been performed. On his left dorsal foot, there is a circular lesion that exposes the fat layer. There is fairly heavy slough accumulation within the wound, but no significant odor. On his right medial calf, he has multiple pinholes that have slough buildup in them and probe  for about 2 to 4 mm in depth. They are draining clear fluid. On his right lateral midfoot, he has ulceration consistent with venous stasis. It is geographic and has slough accumulation. 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 02/17/2022: All of the wounds are little bit larger today and have more slough accumulation. Today, the medial calf openings are larger and probe to something that feels calcified. There is odor coming from his wounds. His vascular studies are scheduled for August 9 and 10. 02-24-2022 upon evaluation today patient presents for follow-up concerning his ongoing issues here with his lower extremities. With that being said he does have significant problems with what appears to be cellulitis unfortunately the PCR culture that was sent last week got crushed by [employer] in transit. With that being said we are going to reobtain a culture today although I am actually to do it on the right medial leg as this seems to be the most inflamed area today where  there is some questionable drainage as well. I am going to remove some of the calcium deposits which are loosening obtain a deeper culture from this area. Fortunately KERRICK, MILER ([id number]) [id number] Page 9 of 15 I do not see any evidence of active infection systemically which is good news but locally I think we are having a bigger issue here. He does have his appointment next Thursday with vascular. Patient has also been referred to [facility] for further evaluation and treatment of the calcinosis for possible sulfate injections. 03/04/2022: The culture that was performed last week grew out multiple species including Klebsiella, Morganella, and Pseudomonas aeruginosa. He had been prescribed Bactrim but this was not adequate to cover all of the species and levofloxacin was recommended. He completed the Bactrim but did not pick up the levofloxacin and begin taking it until yesterday. 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 He had arterial studies performed today. He does have evidence of peripheral arterial disease. Results copied here: +-------+-----------+-----------+------------+------------+ ABI/TBIT oday's ABIT oday's TBIPrevious ABIPrevious TBI +-------+-----------+-----------+------------+------------+ Right [place] 0.75    +-------+-----------+-----------+------------+------------+ Left  0.57    +-------+-----------+-----------+------------+------------+ Arterial wall calcification precludes accurate ankle pressures and ABIs. Summary: Right: Resting right ankle-brachial index indicates noncompressible right lower extremity arteries. The right toe-brachial index is normal. Left: Resting left ankle-brachial index indicates noncompressible left lower extremity arteries. The  left toe-brachial index is abnormal. He continues to have further tissue breakdown at the right medial calf and there is ample slough accumulation along with necrotic subcutaneous tissue. The left dorsal foot wound has built up slough, as well. The right medial foot wound is nearly closed with just a light layer of eschar over the surface. The right dorsal lateral foot wound has also accumulated a fair amount of slough and remains quite tender. 03/10/2022: He has been taking the prescribed levofloxacin and has about 3 days left of this. The erythema and induration on his right leg has improved. He continues to experience further breakdown of the right medial calf wound. There is extensive slough and debris accumulation there. There is heavy slough on his left dorsal foot wound, but no odor or purulent drainage. The right medial foot wound appears to be closed. The right dorsal lateral foot wound also has a fair amount of slough but it is less  tender than at our last visit. He still does not have an appointment with dermatology. 03/22/2022: The right anterior tibial wound has closed. The right lateral foot wound is nearly closed with just a little bit of slough. The left dorsal foot wound is smaller and continues to have some slough buildup but less so than at prior visits. There is good granulation tissue underlying the slough. Unfortunately the right medial calf wound continues to deteriorate. It has a foul odor today and a lot of necrotic tissue, the surrounding skin is also erythematous and moist. 03/30/2022: The right lateral foot wound continues to contract and just has a little bit of slough and eschar present. The left dorsal foot wound is also smaller with slough overlying good granulation tissue. The right medial calf wound actually looks quite a bit better today; there is no odor and there is much less necrotic tissue although some remains present. We have been using topical gentamicin and he has  been taking oral Augmentin. 04/07/2022: The patient saw dermatology at [facility] last week. Unfortunately, it appears that they did not read any of the documentation that was provided. 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 Overall, it was a highly unsatisfactory consultation. In the meantime, however, we were able to identify compounding pharmacy who could create a topical sodium thiosulfate ointment. The patient has that with him today. Both of the right foot wounds are now closed. The left dorsal foot wound has contracted but still has a layer of slough on the surface. The medial calf wounds on the right all look a little bit better. They still have heavy slough along with the chunks of calcium. Everything is stained purple. 04/14/2022: The wound on his dorsal left foot is a little bit smaller again today. There is still slough accumulation on the surface. The medial calf wounds have quite a bit less slough accumulation today. His right leg, however, is warm and erythematous, concerning for cellulitis. 04/14/2022: He completed his course of Augmentin yesterday. The medial calf wound sites are much cleaner today and shallower. There is less fragmented calcium in the wounds. He has a lot of lymphedema fluid-like drainage from these wounds, but no purulent discharge or malodor. The wound on his dorsal left foot has also contracted and is shallower. There is a layer of slough over healthy granulation tissue. 05/05/2022: The left dorsal foot wound is smaller today with less slough and more granulation tissue. The right medial calf wounds are shallower, but have extensive slough and some fat necrosis. The drainage has diminished  considerably, however. 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 05/12/2022: The left dorsal foot wound continues to contract and fill with granulation tissue. There is slough on the wound surface. The right medial calf wounds also continue to fill with healthy tissue, but there is remaining slough and fat necrosis present. He had a significant increase in his drainage this week and it was a bright yellow-green color. He also had a new wound open over his right anterior tibial surface associated with the spike of cutaneous calcium. The leg is more red, as well, concerning for infection. 05/19/2022:  His culture returned positive for Pseudomonas. He is currently taking a course of oral levofloxacin. We have also been using topical gentamicin mixed in with his sodium thiosulfate. All of the wounds look better this week. 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 There is slough and nonviable subcutaneous tissue still present. The new anterior tibial wound just has a light layer of slough. The dorsal foot wound also has some slough present and appears a little dry today. 05/26/2022: The right medial leg wound continues to improve. It is feeling with granulation tissue, but still accumulates a fairly thick layer of slough on the surface. The more recent anterior tibial wound has remained quite small, but also has some slough on the surface. The left dorsal foot wound has contracted somewhat and has perimeter epithelialization. He completed his oral levofloxacin. 06/02/2022: The left dorsal foot wound continues to improve significantly. There is just a little slough on the wound surface. The right medial leg wound had black drainage, but it looks like silver alginate was applied last week and I theorized that silver sulfide was created with a combination of the  silver alginate and the sodium thiosulfate. It did, however, have a bit of an odor to it and extensive slough accumulation. The small anterior tibial wound also had a bit of slough and nonviable subcutaneous tissue present. The skin of his leg was rather macerated, as well. 06/09/2022: The left dorsal foot wound is smaller again this week with just a light layer of slough on the surface. The right medial leg wound looks substantially better. The leg and periwound skin are no longer red and macerated. There is good granulation tissue forming with less slough and nonviable tissue. The anterior tibial wound just has a small amount of slough accumulation. 06/16/2022: The left dorsal foot wound continues to contract. His wrap slipped a little bit, however, and his foot is more swollen. The right medial leg wound continues to improve. The underlying tissue is more vital-appearing and there is less slough. He does have substantial drainage. The small anterior tibial wound [name], [name] ([id number]) [id number] Page 10 of 15 has a bit of slough accumulation. 06/23/2022: The left dorsal foot wound is about half the size as it was last week. There is just some light slough on the surface and some eschar buildup around the edges. The right anterior leg wound is small with a little slough on the surface. Unfortunately, the right medial leg wound deteriorated fairly substantially. It is malodorous with gray/green/black discoloration. 06/30/2022: The left dorsal foot wound continues to contract. It is nearly closed with just a light layer of slough present. The right anterior leg wound is about the same size and has a small amount of slough on the surface. The right medial leg wound looks a lot better than it did last week. The odor has abated and the tissue is less pulpy and necrotic-looking. There is still a fairly thick layer of slough present. 12/13; the left dorsal foot wound looks  very healthy. There was no need to change the dressing here and no debridement. He has a small area on the right anterior lower leg apparently had not changed much in size. The area on the medial leg is the most problematic area this is large with some depth and a completely nonviable surface today. 07/14/2022: The left dorsal foot wound is smaller today with just a little slough and eschar present. The right anterior lower leg wound is about the  same without any significant change. The medial leg wound has a black and green surface and is malodorous. No purulent drainage and the periwound skin is intact. Edema control is suboptimal. 07/21/2022: The dorsal foot wound continues to contract and is nearly closed with just a little bit of slough and eschar on the surface. The right anterior lower leg wound is shallower today but otherwise the dimensions are unchanged. The medial leg wound looks significantly better although it still has a nonviable surface; the odor and black/green surface has resolved. His culture came back with multiple species sensitive to Augmentin, which was prescribed and a very low level of Pseudomonas, which should be handled by the topical gentamicin we have been using. 07/28/2022: The dorsal foot wound is down to just a very tiny opening with a little eschar on the surface. The right anterior tibial wound is about the same with some slough buildup. The right medial leg wound looks quite a bit better this week. There is thick rubbery slough on the surface, but the odor and drainage has improved significantly. 08/04/2022: The dorsal foot wound is almost healed, but the periwound skin has broken down. This appears to be secondary to moisture and adhesive from the absorbent pad. The right anterior tibial wound is shallower. The right medial leg wound continues to improve. It is smaller, still with rubbery slough on the surface. No malodor or purulent drainage. 08/11/2022: The dorsal  foot wound is about the same size. It is a little bit dry with some slough accumulation. The right anterior tibial wound is also unchanged. The right medial leg wound is filling in further with good granulation tissue. Still with some slough buildup on the surface. 08/18/2022: His left leg is bright red, hot, and swollen with cellulitis. The dorsal foot wound actually looks quite good and is superficial with just a little bit of slough accumulation. The right anterior tibial wound is unchanged. The right medial leg wound has filled in with good granulation tissue but has copious amounts of drainage. 08/25/2022: His cellulitis has responded nicely to the oral antibiotics. His dorsal foot wound is smaller but has a fair amount of slough buildup. The right anterior tibial wound remains stable and unchanged. The right medial leg wound has more granulation tissue under a layer of slough, but continues to have copious drainage. The drainage is actually causing skin irritation and threatens to result in breakdown. 09/01/2022: He continues to have profuse drainage from his wounds which is resulting in tissue maceration on both the dorsum of his left foot and the periwound distal to his medial leg wound. The dorsal foot wound is nearly healed, despite this. The medial right leg wound is filling in with good granulation tissue. We are working on getting home health assistance to change his dressings more frequently to try and control the drainage better. Patient History Information obtained from Patient, Chart. Family History Cancer - Mother, Heart Disease - Father,Mother, Hypertension - Father,Siblings, Lung Disease - Siblings, No family history of Diabetes, Hereditary Spherocytosis, Kidney Disease, Seizures, Stroke, Thyroid Problems, Tuberculosis. Social History Former smoker - quit 1991, Marital Status - Married, Alcohol Use - Moderate, Drug Use - No History, Caffeine Use - Daily - coffee. Medical  History Eyes Patient has history of Cataracts - right eye removed Denies history of Glaucoma, Optic Neuritis Ear/Nose/Mouth/Throat Denies history of Chronic sinus problems/congestion, Middle ear problems Hematologic/Lymphatic Patient has history of Anemia - macrocytic, Lymphedema Cardiovascular Patient has history of Congestive Heart Failure, Deep Vein  Thrombosis - left leg, Hypertension, Peripheral Venous Disease Endocrine Denies history of Type I Diabetes, Type II Diabetes Genitourinary Denies history of End Stage Renal Disease Integumentary (Skin) Denies history of History of Burn Oncologic Denies history of Received Chemotherapy, Received Radiation Psychiatric Denies history of Anorexia/bulimia, Confinement Anxiety Hospitalization/Surgery History - cervical fusion. - lumbar surgery. - coronary stent placement. - lasik eye surgery. - hydrocele excision. Medical A Surgical History Notes nd Constitutional Symptoms (General Health) obesity Ear/Nose/Mouth/Throat hard of hearing Respiratory frozen diaphragm right Cardiovascular hyperlipidemia Musculoskeletal myositis, lumbar DDD, spinal stenosis, cervical facet joint syndrome [name], MALENFANT ([id number]) [id number] Page 11 of 15 Objective Constitutional Slightly bradycardic. no acute distress. Vitals Time Taken: 12:36 PM, Height: 71 in, Weight: 267 lbs, BMI: 37.2, Temperature: 97.7 F, Pulse: 59 bpm, Respiratory Rate: 20 breaths/min, Blood Pressure: 138/77 mmHg. Respiratory Normal work of breathing on room air. General Notes: 09/01/2022: He continues to have profuse drainage from his wounds which is resulting in tissue maceration on both the dorsum of his left foot and the periwound distal to his medial leg wound. The dorsal foot wound is nearly healed, despite this. The medial right leg wound is filling in with good granulation tissue. Integumentary (Hair, Skin) Wound #1 status is Open. Original  cause of wound was Gradually Appeared. The date acquired was: 12/07/2021. The wound has been in treatment 29 [name]. The wound is located on the Left,Dorsal Foot. The wound measures 1.4cm length x 0.8cm width x 0.1cm depth; 0.88cm^2 area and 0.088cm^3 volume. There is Fat Layer (Subcutaneous Tissue) exposed. There is no tunneling or undermining noted. There is a large amount of serous drainage noted. The wound margin is flat and intact. There is medium (34-66%) red, pink granulation within the wound bed. There is a medium (34-66%) amount of necrotic tissue within the wound bed including Adherent Slough. The periwound skin appearance exhibited: Excoriation, Hemosiderin Staining. The periwound skin appearance did not exhibit: Dry/Scaly, Maceration, Rubor, Erythema. Periwound temperature was noted as Hot. The periwound has tenderness on palpation. Wound #3 status is Open. Original cause of wound was Gradually Appeared. The date acquired was: 12/07/2021. The wound has been in treatment 29 [name]. The wound is located on the Right,Medial Lower Leg. The wound measures 3.2cm length x 6.1cm width x 0.7cm depth; 15.331cm^2 area and 10.732cm^3 volume. There is Fat Layer (Subcutaneous Tissue) exposed. There is no tunneling or undermining noted. There is a medium amount of serosanguineous drainage noted. The wound margin is distinct with the outline attached to the wound base. There is small (1-33%) red, pink granulation within the wound bed. There is a large (67-100%) amount of necrotic tissue within the wound bed including Adherent Slough. The periwound skin appearance exhibited: Excoriation, Hemosiderin Staining. The periwound skin appearance did not exhibit: Dry/Scaly, Maceration, Rubor, Erythema. Periwound temperature was noted as No Abnormality. The periwound has tenderness on palpation. Wound #6 status is Open. Original cause of wound was Gradually Appeared. The date acquired was: 05/12/2022. The wound has been  in treatment 16 [name]. The wound is located on the Right,Anterior Lower Leg. The wound measures 0.3cm length x 0.2cm width x 0.2cm depth; 0.047cm^2 area and 0.009cm^3 volume. There is Fat Layer (Subcutaneous Tissue) exposed. There is no tunneling or undermining noted. There is a medium amount of serosanguineous drainage noted. The wound margin is epibole. There is no granulation within the wound bed. There is no necrotic tissue within the wound bed. The periwound skin appearance had no abnormalities noted for texture. The periwound skin  appearance had no abnormalities noted for moisture. The periwound skin appearance exhibited: Hemosiderin Staining. The periwound skin appearance did not exhibit: Atrophie Blanche, Cyanosis, Ecchymosis, Mottled, Pallor, Rubor, Erythema. Periwound temperature was noted as No Abnormality. 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 There was a Selective/Open Wound Non-Viable Tissue Debridement with a total area of 1.12 sq cm performed by [name] Maudlin, MD. With the following instrument(s): Curette to remove Non-Viable tissue/material. Material removed includes [facility] after achieving pain control using Lidocaine 4% Topical Solution. No specimens were taken. A time out was conducted at 13:10, prior to the start of the procedure. A Minimum amount of bleeding was controlled with Pressure. The procedure was tolerated well with a pain level of 0  throughout and a pain level of 0 following the procedure. Post Debridement Measurements: 1.4cm length x 0.8cm width x 0.1cm depth; 0.088cm^3 volume. Character of Wound/Ulcer Post Debridement is improved. Post procedure Diagnosis Wound #1: Same as Pre-Procedure General Notes: Scribed for [name] by [name] Gouty, RN. Pre-procedure diagnosis of Wound #1 is a Lymphedema located on the Left,Dorsal Foot . There was a Four Layer Compression Therapy Procedure by [name] Gouty, RN. Post procedure Diagnosis Wound #1: Same as Pre-Procedure [name], [name] ([id number]) [id number] Page 12 of 15 Wound #3 Pre-procedure diagnosis of Wound #3 is a Lymphedema located on the Right,Medial Lower Leg . There was a Excisional Skin/Subcutaneous Tissue Debridement with a total area of 16 sq cm performed by [name] Maudlin, MD. With the following instrument(s): Curette to remove Viable and Non-Viable tissue/material. Material removed includes Subcutaneous Tissue, Slough, and Other: caLCIUM DEPOSITS after achieving pain control using Lidocaine 4% T opical Solution. No specimens were taken. A time out was conducted at 13:10, prior to the start of the procedure. A Minimum amount of bleeding was controlled with Pressure. The procedure was tolerated well with a pain level of 0 throughout and a pain level of 0 following the procedure. Post Debridement Measurements: 3.2cm length x 6.1cm width x 0.7cm depth; 10.732cm^3 volume. Character of Wound/Ulcer Post Debridement is improved. Post procedure Diagnosis Wound #3: Same as Pre-Procedure General Notes: Scribed for [name] by [name] Gouty, RN. Pre-procedure diagnosis of Wound #3 is a Lymphedema located on the Right,Medial Lower Leg . There was a Four Layer Compression Therapy Procedure by [name] Gouty, RN. Post procedure Diagnosis Wound #3: Same as Pre-Procedure Plan Follow-up Appointments: Return Appointment in 1 week. - [name] Rm 1  Wednesday 2/14 @ 1230 pm Anesthetic: Wound #1 Left,Dorsal Foot: (In clinic) Topical Lidocaine 4% applied to wound bed Wound #3 Right,Medial Lower Leg: (In clinic) Topical Lidocaine 4% applied to wound bed Wound #6 Right,Anterior Lower Leg: (In clinic) Topical Lidocaine 4% applied to wound bed Bathing/ Shower/ Hygiene: May shower with protection but do not get wound dressing(s) wet. Protect dressing(s) with water repellant cover (for example, large plastic bag) or a cast cover and may then take shower. Edema Control - Lymphedema / SCD / Other: Lymphedema Pumps. Use Lymphedema pumps on leg(s) 2-3 times a day for 45-60 minutes. If wearing any wraps or hose, do  not remove them. Continue exercising as instructed. - use 1-2 times per day over wraps Elevate legs to the level of the heart or above for 30 minutes daily and/or when sitting for 3-4 times a day throughout the day. - throughout the day Avoid standing for long periods of time. Exercise regularly Home Health: Admit to [facility] for skilled nursing wound care. May utilize formulary equivalent dressing for wound treatment orders unless otherwise specified. New wound care orders this week; continue Home Health for wound care. May utilize formulary equivalent dressing for wound treatment orders unless otherwise specified. Dressing changes to be completed by A[facility] on Monday / Wednesday / Friday except when patient has scheduled visit at [facility]. - pt seen in clinic on Wednesday Other [place] Orders/Instructions: - [facility] The following medication(s) was prescribed: Santyl topical 250 unit/gram ointment Apply to wound with dressing changes as directed starting 09/01/2022 WOUND #1: - Foot Wound Laterality: Dorsal, Left Peri-Wound Care: Triamcinolone 15 (g) 3 x Per Week/30 Days Discharge Instructions: Use triamcinolone 15 (g) ( in clinic) Peri-Wound Care: Zinc Oxide Ointment 30g tube 3 x Per Week/30 Days Discharge  Instructions: Apply Zinc Oxide to periwound with each dressing change as needed for excoriation Peri-Wound Care: Sween Lotion (Moisturizing lotion) 3 x Per Week/30 Days Discharge Instructions: Apply moisturizing lotion as directed Prim Dressing: Sorbalgon AG Dressing, 4x4 (in/in) (Generic) 3 x Per Week/30 Days ary Discharge Instructions: or equivalent silver alginate. Apply to wound bed as instructed Secondary Dressing: ABD Pad, 5x9 (Generic) 3 x Per Week/30 Days Discharge Instructions: Apply over primary dressing as directed. Secondary Dressing: Woven Gauze Sponge, Non-Sterile 4x4 in (Generic) 3 x Per Week/30 Days Discharge Instructions: Apply over primary dressing as directed. Secondary Dressing: Zetuvit Plus 4x8 in (Generic) 3 x Per Week/30 Days Discharge Instructions: Apply over primary dressing as directed. Com pression Wrap: FourPress (4 layer compression wrap) (Generic) 3 x Per Week/30 Days Discharge Instructions: Apply four layer compression as directed. May also use Urgo K2 compression system as alternative. WOUND #3: - Lower Leg Wound Laterality: Right, Medial Peri-Wound Care: Triamcinolone 15 (g) 1 x Per Week/30 Days Discharge Instructions: Use triamcinolone 15 (g) as directed Peri-Wound Care: Zinc Oxide Ointment 30g tube 1 x Per Week/30 Days Discharge Instructions: Apply Zinc Oxide to periwound with each dressing change Peri-Wound Care: Sween Lotion (Moisturizing lotion) 1 x Per Week/30 Days Discharge Instructions: Apply moisturizing lotion as directed Prim Dressing: Santyl Ointment 1 x Per Week/30 Days ary Discharge Instructions: Apply nickel thick amount to wound bed as instructed Prim Dressing: sodium thiosulfate ointment 1 x Per Week/30 Days ary Discharge Instructions: 1:1 mix with santyl to wound bed Secondary Dressing: ABD Pad, 8x10 (Generic) 1 x Per Week/30 Days Discharge Instructions: Apply over primary dressing as directed. Secondary Dressing: Woven Gauze Sponge,  Non-Sterile 4x4 in (Generic) 1 x Per Week/30 Days Discharge Instructions: Apply over primary dressing as directed. Secondary Dressing: Zetuvit Plus 4x8 in (Generic) 1 x Per Week/30 Days Discharge Instructions: Apply over primary dressing as directed. Com pression Wrap: FourPress (4 layer compression wrap) (Generic) 1 x Per Week/30 Days Discharge Instructions: Apply four layer compression as directed. May also use Urgo K2 compression system as alternative. WOUND #6: - Lower Leg Wound Laterality: Right, Anterior Peri-Wound Care: Triamcinolone 15 (g) 3 x Per Week/30 Days Discharge Instructions: Use triamcinolone 15 (g) ( in clinic) Peri-Wound Care: Zinc Oxide Ointment 30g tube 3 x Per Week/30 Days Discharge Instructions: Apply Zinc Oxide to periwound with each dressing  change as needed for excoriation [name], [name] ([id number]) [id number] Page 13 of 15 Peri-Wound Care: Sween Lotion (Moisturizing lotion) 3 x Per Week/30 Days Discharge Instructions: Apply moisturizing lotion as directed Prim Dressing: Sorbalgon AG Dressing, 4x4 (in/in) (Generic) 3 x Per Week/30 Days ary Discharge Instructions: or equivalent silver alginate. Apply to wound bed as instructed Secondary Dressing: ABD Pad, 5x9 (Generic) 3 x Per Week/30 Days Discharge Instructions: Apply over primary dressing as directed. Secondary Dressing: Woven Gauze Sponge, Non-Sterile 4x4 in (Generic) 3 x Per Week/30 Days Discharge Instructions: Apply over primary dressing as directed. Secondary Dressing: Zetuvit Plus 4x8 in (Generic) 3 x Per Week/30 Days Discharge Instructions: Apply over primary dressing as directed. Com pression Wrap: FourPress (4 layer compression wrap) (Generic) 3 x Per Week/30 Days Discharge Instructions: Apply four layer compression as directed. May also use Urgo K2 compression system as alternative. 09/01/2022: He continues to have profuse drainage from his wounds which is resulting in tissue  maceration on both the dorsum of his left foot and the periwound distal to his medial leg wound. The dorsal foot wound is nearly healed, despite this. The medial right leg wound is filling in with good granulation tissue. I used a curette to debride slough from the dorsal foot wound and slough and nonviable subcutaneous tissue from the medial leg wound. We will continue to use the combination of sodium thiosulfate compound and Santyl to the medial leg wound. We will use silver alginate to all of the other open wound sites. We will increase his compression to 4-layer from 3 layer in an effort to better control his edema. I think he will benefit from having his dressings changed more frequently to avoid accumulation of moisture from his copious drainage. I also think he needs to be taking his furosemide on a more regular basis instead of on an as needed. I have asked him to discuss this with his primary care provider. Follow-up in 1 week. Electronic Signature(s) Signed: 09/01/2022 2:00:15 PM By: [name] Maudlin MD FACS Previous Signature: 09/01/2022 1:55:14 PM Version By: [name] Maudlin MD FACS Entered By: [name] on 09/01/2022 14:00:15 -------------------------------------------------------------------------------- HxROS Details Patient Name: Date of Service: ZAKIR, HENNER [name] 09/01/2022 12:30 PM Medical Record Number: [id number] Patient Account Number: [id number] Date of Birth/Sex: Treating RN: 03/21/1951 (72 y.o. M) Primary Care Provider: PA Haig [name], NO Other Clinician: Referring Provider: Treating Provider/Extender: [name] in Treatment: 29 Information Obtained From Patient Chart Constitutional Symptoms (General Health) Medical History: Past Medical History Notes: obesity Eyes Medical History: Positive for: Cataracts - right eye removed Negative for: Glaucoma; Optic Neuritis Ear/Nose/Mouth/Throat Medical History: Negative for: Chronic sinus  problems/congestion; Middle ear problems Past Medical History Notes: hard of hearing Hematologic/Lymphatic Medical History: Positive for: Anemia - macrocytic; Lymphedema Respiratory Medical History: Past Medical History Notes: frozen diaphragm right KEIN, CARLBERG ([id number]) [id number] Page 14 of 15 Cardiovascular Medical History: Positive for: Congestive Heart Failure; Deep Vein Thrombosis - left leg; Hypertension; Peripheral Venous Disease Past Medical History Notes: hyperlipidemia Endocrine Medical History: Negative for: Type I Diabetes; Type II Diabetes Genitourinary Medical History: Negative for: End Stage Renal Disease Integumentary (Skin) Medical History: Negative for: History of Burn Musculoskeletal Medical History: Past Medical History Notes: myositis, lumbar DDD, spinal stenosis, cervical facet joint syndrome Oncologic Medical History: Negative for: Received Chemotherapy; Received Radiation Psychiatric Medical History: Negative for: Anorexia/bulimia; Confinement Anxiety HBO Extended History Items Eyes: Cataracts Immunizations Pneumococcal Vaccine: Received Pneumococcal Vaccination: Yes Received Pneumococcal Vaccination On or After 60th Birthday: Yes Implantable Devices  No devices added Hospitalization / Surgery History Type of Hospitalization/Surgery cervical fusion lumbar surgery coronary stent placement lasik eye surgery hydrocele excision Family and Social History Cancer: Yes - Mother; Diabetes: No; Heart Disease: Yes - Father,Mother; Hereditary Spherocytosis: No; Hypertension: Yes - Father,Siblings; Kidney Disease: No; Lung Disease: Yes - Siblings; Seizures: No; Stroke: No; Thyroid Problems: No; Tuberculosis: No; Former smoker - quit 1991; Marital Status - Married; Alcohol Use: Moderate; Drug Use: No History; Caffeine Use: Daily - coffee; Financial Concerns: No; Food, Clothing or Shelter Needs: No; Support System  Lacking: No; Transportation Concerns: No Electronic Signature(s) Signed: 09/01/2022 5:26:12 PM By: [name] Maudlin MD FACS Entered By: [name] on 09/01/2022 13:53:09 [name] ([id number]) [id number] Page 15 of 15 -------------------------------------------------------------------------------- SuperBill Details Patient Name: Date of Service: MY, MADARIAGA 09/01/2022 Medical Record Number: [id number] Patient Account Number: [id number] Date of Birth/Sex: Treating RN: September 21, 1950 (72 y.o. M) Primary Care Provider: PA Haig [name], NO Other Clinician: Referring Provider: Treating Provider/Extender: [name] in Treatment: 29 Diagnosis Coding ICD-10 Codes Code Description [phone] Non-pressure chronic ulcer of other part of right lower leg with fat layer exposed L97.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) I10 Essential (primary) hypertension I25.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 I89.0 Lymphedema, not elsewhere classified Z86.718 Personal history of other venous thrombosis and embolism Facility Procedures : CPT4 Code: [id number] Description: 76283 - DEB SUBQ TISSUE 20 SQ CM/< ICD-10 Diagnosis Description 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 Modifier: ed Quantity: 1 : CPT4 Code: [id number] Description: 73710 - DEBRIDE WOUND 1ST 20 SQ CM OR < ICD-10 Diagnosis Description L97.522 Non-pressure chronic ulcer of other part of left foot with fat layer exposed Modifier: Quantity: 1 Physician Procedures : CPT4 Code Description Modifier [id number] - WC PHYS LEVEL 4 - EST PT 25 ICD-10 Diagnosis Description 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ed L97.522  Non-pressure chronic ulcer of other part of left  foot with fat layer exposed 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) Quantity: 1 : [id number] 18299 - WC PHYS SUBQ TISS 20 SQ CM ICD-10 Diagnosis Description 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 Quantity: 1 : [id number] - WC PHYS DEBR WO ANESTH 20 SQ CM ICD-10 Diagnosis Description L97.522 Non-pressure chronic ulcer of other part of left foot with fat layer exposed Quantity: 1 Electronic Signature(s) Signed: 09/01/2022 1:55:47 PM By: [name] Maudlin MD FACS Entered By: [name] on 09/01/2022 13:55:47

## 2022-09-03 ENCOUNTER — Other Ambulatory Visit: Payer: Self-pay | Admitting: Physician Assistant

## 2022-09-03 ENCOUNTER — Other Ambulatory Visit: Payer: Self-pay

## 2022-09-03 ENCOUNTER — Inpatient Hospital Stay: Payer: Medicare Other | Attending: Nurse Practitioner

## 2022-09-03 ENCOUNTER — Inpatient Hospital Stay (HOSPITAL_BASED_OUTPATIENT_CLINIC_OR_DEPARTMENT_OTHER): Payer: Medicare Other | Admitting: Physician Assistant

## 2022-09-03 VITALS — BP 150/71 | HR 63 | Temp 97.3°F | Resp 18 | Wt 283.4 lb

## 2022-09-03 DIAGNOSIS — E785 Hyperlipidemia, unspecified: Secondary | ICD-10-CM | POA: Diagnosis not present

## 2022-09-03 DIAGNOSIS — Z87891 Personal history of nicotine dependence: Secondary | ICD-10-CM | POA: Diagnosis not present

## 2022-09-03 DIAGNOSIS — I251 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: Secondary | ICD-10-CM | POA: Insufficient documentation

## 2022-09-03 DIAGNOSIS — D539 Nutritional anemia, unspecified: Secondary | ICD-10-CM | POA: Insufficient documentation

## 2022-09-03 DIAGNOSIS — Z8269 Family history of other diseases of the musculoskeletal system and connective tissue: Secondary | ICD-10-CM | POA: Diagnosis not present

## 2022-09-03 DIAGNOSIS — R600 Localized edema: Secondary | ICD-10-CM | POA: Insufficient documentation

## 2022-09-03 DIAGNOSIS — Z86718 Personal history of other venous thrombosis and embolism: Secondary | ICD-10-CM | POA: Insufficient documentation

## 2022-09-03 DIAGNOSIS — Z8249 Family history of ischemic heart disease and other diseases of the circulatory system: Secondary | ICD-10-CM | POA: Insufficient documentation

## 2022-09-03 DIAGNOSIS — Z800 Family history of malignant neoplasm of digestive organs: Secondary | ICD-10-CM | POA: Diagnosis not present

## 2022-09-03 DIAGNOSIS — D696 Thrombocytopenia, unspecified: Secondary | ICD-10-CM

## 2022-09-03 DIAGNOSIS — Z7901 Long term (current) use of anticoagulants: Secondary | ICD-10-CM | POA: Diagnosis not present

## 2022-09-03 DIAGNOSIS — Z79899 Other long term (current) drug therapy: Secondary | ICD-10-CM | POA: Insufficient documentation

## 2022-09-03 LAB — CBC WITH DIFFERENTIAL (CANCER CENTER ONLY)
Abs Immature Granulocytes: 0.02 10*3/uL (ref 0.00–0.07)
Basophils Absolute: 0 10*3/uL (ref 0.0–0.1)
Basophils Relative: 1 %
Eosinophils Absolute: 0.2 10*3/uL (ref 0.0–0.5)
Eosinophils Relative: 4 %
HCT: 36.3 % — ABNORMAL LOW (ref 39.0–52.0)
Hemoglobin: 12.2 g/dL — ABNORMAL LOW (ref 13.0–17.0)
Immature Granulocytes: 0 %
Lymphocytes Relative: 23 %
Lymphs Abs: 1.2 10*3/uL (ref 0.7–4.0)
MCH: 34.1 pg — ABNORMAL HIGH (ref 26.0–34.0)
MCHC: 33.6 g/dL (ref 30.0–36.0)
MCV: 101.4 fL — ABNORMAL HIGH (ref 80.0–100.0)
Monocytes Absolute: 0.5 10*3/uL (ref 0.1–1.0)
Monocytes Relative: 10 %
Neutro Abs: 3.1 10*3/uL (ref 1.7–7.7)
Neutrophils Relative %: 62 %
Platelet Count: 107 10*3/uL — ABNORMAL LOW (ref 150–400)
RBC: 3.58 MIL/uL — ABNORMAL LOW (ref 4.22–5.81)
RDW: 12.6 % (ref 11.5–15.5)
WBC Count: 5 10*3/uL (ref 4.0–10.5)
nRBC: 0 % (ref 0.0–0.2)

## 2022-09-03 LAB — CMP (CANCER CENTER ONLY)
ALT: 15 U/L (ref 0–44)
AST: 21 U/L (ref 15–41)
Albumin: 3.5 g/dL (ref 3.5–5.0)
Alkaline Phosphatase: 48 U/L (ref 38–126)
Anion gap: 5 (ref 5–15)
BUN: 27 mg/dL — ABNORMAL HIGH (ref 8–23)
CO2: 31 mmol/L (ref 22–32)
Calcium: 9 mg/dL (ref 8.9–10.3)
Chloride: 103 mmol/L (ref 98–111)
Creatinine: 1.01 mg/dL (ref 0.61–1.24)
GFR, Estimated: >60 mL/min (ref >60)
Glucose, Bld: 108 mg/dL — ABNORMAL HIGH (ref 70–99)
Potassium: 4 mmol/L (ref 3.5–5.1)
Sodium: 139 mmol/L (ref 135–145)
Total Bilirubin: 0.3 mg/dL (ref 0.3–1.2)
Total Protein: 6.5 g/dL (ref 6.5–8.1)

## 2022-09-03 NOTE — Progress Notes (Signed)
[place] [phone]   [phone]  PROGRESS NOTE  Patient Care Team: Patient, No Pcp Per as PCP - General (General Practice) [name] Records, MD as PCP - Cardiology (Cardiology) [name] Records, MD as Consulting Physician (Cardiology) [name] Sine, MD as Consulting Physician (Dermatology)  Hematological/Oncological History 1) 01/02/2021: Presented to ED in [place] due to swelling and redness of the legs x 8 days.  Doppler US revealed nonocclusive deep venous thrombus involving the left common femoral vein, proximal femoral vein and popliteal vein extending to the calf trifurcation. Stared Eliquis therapy  2) 01/29/2022: Hypercoagulable panel ordered and did not show underlying clotting disorder.   3) 03/03/2022: Establish care with [facility] Hematology with [name]. 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 adult returns for a follow up for history of DVT and macrocytic anemia/thrombocytopenia. He is unaccompanied for this visit.   On exam today, [name] reports that he is tolerating the Eliquis without any prohibitive toxicities. He is under wound care from chronic bilateral lower extremity edema that is currently wrapped. He does bruise easily but denies signs of active bleeding. He reports stable appetite and energy levels. . He denies GI symptoms including nausea, vomiting, diarrhea or constipation. He denies fevers, chills, night sweats, shortness of breath, chest pain or cough. He has no other complaints. Rest of the 10 point ROS is below.    MEDICAL HISTORY:  Past Medical History:  Diagnosis Date   Adenomatous colon polyp - diminutive 06/2019   no recall needed   Coronary artery disease    a. s/p BMS to LAD (1998) b. s/p DES to RCA (06/24/14)   Dyspnea    HTN (hypertension)    Hyperlipidemia    Myositis    chronic CK elevation ( 415  CK, ALL MM CK subtype, seen by Rheumatology and considered normal Variant)   Palatal fistula    Palpable abd. aorta    Pneumonia    RBBB    Spinal stenosis    [name]  surgery   Weakness     SURGICAL HISTORY: Past Surgical History:  Procedure Laterality Date   CERVICAL FUSION  09/2009   CORONARY ANGIOPLASTY     CORONARY STENT PLACEMENT  06/24/2014   DES to RCA   [place] WITH CORONARY ANGIOGRAM N/A 06/24/2014   Procedure: LEFT HEART CATHETERIZATION WITH CORONARY ANGIOGRAM;  Surgeon: [name] Sine, MD;  Location: [facility] CATH LAB;  Service: Cardiovascular;  Laterality: N/A;   LUMBAR SPINE SURGERY  5/13   MUSCLE BIOPSY Left 07/11/2018   Procedure: Left Vastus lateralus muscle biopsy;  Surgeon: [name] Part, MD;  Location: [place];  Service: Neurosurgery;  Laterality: Left;  Left Vastus lateralus muscle biopsy   PERCUTANEOUS CORONARY STENT INTERVENTION (PCI-S)  06/24/2014   Procedure: PERCUTANEOUS CORONARY STENT INTERVENTION (PCI-S);  Surgeon: [name] Sine, MD;  Location: [facility] CATH LAB;  Service: Cardiovascular;;  Distal RCA    SOCIAL HISTORY: Social History   Socioeconomic History   Marital status: Married    Spouse name: Not on file   Number of children: Not on file   Years of education: Not on file   Highest education level: Not on file  Occupational History   Not on file  Tobacco Use  Smoking status: Former    Packs/day: 0.75    Years: 23.00    Total pack years: 17.25    Types: Cigarettes    Quit date: 07/26/1989    Years since quitting: 33.1   Smokeless tobacco: Never  Vaping Use   Vaping Use: Never used  Substance and Sexual Activity   Alcohol use: Yes    Comment: 3-6 cans per week   Drug use: No   Sexual activity: Yes    Birth control/protection: None  Other Topics Concern   Not on file  Social History Narrative   Married, wife moving to [place]   Retired , prior occupation self    Patient is a former smoker   Alcohol use - yes   Regular exercise   Drug Use- no    caffeine 3 cups coffee daily   Social Determinants of Radio broadcast assistant Strain: Not on file  Food Insecurity: Not on file  Transportation Needs: Not on file  Physical Activity: Not on file  Stress: Not on file  Social Connections: Not on file  Intimate Partner Violence: Not on file    FAMILY HISTORY: Family History  Problem Relation Age of Onset   Heart attack Mother    Cancer Mother        esophageal   Aneurysm Father    Heart disease Father    Cystic fibrosis Brother        no prior health issues, rare form of cystic fibrosis of the lungs (aug 2016)   Stroke Neg Hx     ALLERGIES:  has No Known Allergies.  MEDICATIONS:  Current Outpatient Medications  Medication Sig Dispense Refill   acetaminophen (TYLENOL) 500 MG tablet Take by mouth as needed for moderate pain or mild pain.     apixaban (ELIQUIS) 2.5 MG TABS tablet Take 1 tablet by mouth twice daily 60 tablet 5   atenolol (TENORMIN) 25 MG tablet Take 1 tablet (25 mg total) by mouth 2 (two) times daily. 180 tablet 3   b complex vitamins tablet Take 1 tablet by mouth daily.     betamethasone dipropionate (DIPROLENE) 0.05 % cream Apply 1 application topically every other day.     cholecalciferol (VITAMIN D) 1000 units tablet Take 1,000 Units by mouth daily.     Coenzyme Q10 (CO Q 10) 100 MG CAPS Take 100 mg by mouth daily.      diphenhydrAMINE (BENADRYL) 25 MG tablet Take 50 mg by mouth at bedtime.     ezetimibe (ZETIA) 10 MG tablet Take 1 tablet (10 mg total) by mouth daily. 90 tablet 3   folic acid (FOLVITE) Q000111Q MCG tablet Take 800 mcg by mouth daily.      furosemide (LASIX) 40 MG tablet TAKE 1 TABLET BY MOUTH FOR 3 DAYS THEN TAKE ONLY AS NEEDED FOR EDEMA 90 tablet 3   glucosamine-chondroitin 500-400 MG tablet Take 2 tablets by mouth daily.      ibuprofen (ADVIL,MOTRIN) 800 MG tablet Take 800 mg by mouth daily.      Krill  Oil 500 MG CAPS Take 500 mg by mouth daily.      lisinopril (ZESTRIL) 10 MG tablet Take 1 tablet (10 mg total) by mouth daily. 90 tablet 3   Melatonin 10 MG TABS Take 20 mg by mouth at bedtime.     potassium chloride (K-DUR) 10 MEQ tablet TAKE 1 TABLET BY MOUTH FOR 3 DAYS THEN TAKE ONLY AS NEEDED WHEN YOU TAKE A LASIX 90  tablet 3   rosuvastatin (CRESTOR) 5 MG tablet Take 1 tablet (5 mg total) by mouth daily. 90 tablet 3   Saw Palmetto, Serenoa repens, (SAW PALMETTO PO) Take 300 mg by mouth daily.     sulfamethoxazole-trimethoprim (BACTRIM DS) 800-160 MG tablet Take 1 tablet by mouth 2 (two) times daily.     triamcinolone cream (KENALOG) 0.1 % Apply 1 application topically as directed. 30 g 3   amoxicillin-clavulanate (AUGMENTIN) 875-125 MG tablet Take 1 tablet by mouth 2 (two) times daily. (Patient not taking: Reported on 05/05/2022)     No current facility-administered medications for this visit.    REVIEW OF SYSTEMS:   Constitutional: ( - ) fevers, ( - )  chills , ( - ) night sweats Eyes: ( - ) blurriness of vision, ( - ) double vision, ( - ) watery eyes Ears, nose, mouth, throat, and face: ( - ) mucositis, ( - ) sore throat Respiratory: ( - ) cough, ( - ) dyspnea, ( - ) wheezes Cardiovascular: ( - ) palpitation, ( - ) chest discomfort, ( + ) lower extremity swelling Gastrointestinal:  ( - ) nausea, ( - ) heartburn, ( - ) change in bowel habits Skin: ( - ) abnormal skin rashes Lymphatics: ( - ) new lymphadenopathy, ( - ) easy bruising Neurological: ( - ) numbness, ( - ) tingling, ( - ) new weaknesses Behavioral/Psych: ( - ) mood change, ( - ) new changes  All other systems were reviewed with the patient and are negative.  PHYSICAL EXAMINATION: ECOG PERFORMANCE STATUS: 1 - Symptomatic but completely ambulatory  Vitals:   09/03/22 1451  BP: (!) 150/71  Pulse: 63  Resp: 18  Temp: (!) 97.3 F (36.3 C)  SpO2: 99%   Filed Weights   09/03/22 1451  Weight: 283 lb 6.4 oz (128.5 kg)     GENERAL: well appearing male in NAD  SKIN: skin color, texture, turgor are normal, no rashes or significant lesions EYES: conjunctiva are pink and non-injected, sclera clear LUNGS: clear to auscultation and percussion with normal breathing effort HEART: regular rate & rhythm and no murmurs. Bilateral lower extremity edema, wrapped so unable to assess ulcerations.  ABDOMEN: soft, non-tender, non-distended, normal bowel sounds Musculoskeletal: no cyanosis of digits and no clubbing  PSYCH: alert & oriented x 3, fluent speech NEURO: no focal motor/sensory deficits  LABORATORY DATA:  I have reviewed the data as listed    Latest Ref Rng & Units 09/03/2022    2:15 PM 03/12/2022   10:57 AM 03/03/2022   12:19 PM  CBC  WBC 4.0 - 10.5 K/uL 5.0  5.8  5.2   Hemoglobin 13.0 - 17.0 g/dL 12.2  12.5  12.2   Hematocrit 39.0 - 52.0 % 36.3  37.5  36.8   Platelets 150 - 400 K/uL 107  115  104        Latest Ref Rng & Units 09/03/2022    2:15 PM 04/05/2022   12:25 PM 03/12/2022   10:57 AM  CMP  Glucose 70 - 99 mg/dL 108  68  87   BUN 8 - 23 mg/dL 27  21  37   Creatinine 0.61 - 1.24 mg/dL 1.01  0.84  1.07   Sodium 135 - 145 mmol/L 139  139  138   Potassium 3.5 - 5.1 mmol/L 4.0  4.7  4.8   Chloride 98 - 111 mmol/L 103  102  104   CO2 22 - 32 mmol/L 31  24  31   Calcium 8.9 - 10.3 mg/dL 9.0  9.3  9.2   Total Protein 6.5 - 8.1 g/dL 6.5   6.4   Total Bilirubin 0.3 - 1.2 mg/dL 0.3   0.5   Alkaline Phos 38 - 126 U/L 48   45   AST 15 - 41 U/L 21   18   ALT 0 - 44 U/L 15   13     RADIOGRAPHIC STUDIES: I have personally reviewed the radiological images as listed and agreed with the findings in the report. No results found.  ASSESSMENT & PLAN [name] is a 72 y.o. adult returns for a follow up for history of DVT while on Eliquis therapy.   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 Recommend to continue --patient  denies any bleeding, bruising, or dark stools on this medication. It is well tolerated. No difficulties accessing/affording the medication --RTC in 6 months' time with strict return precautions for overt signs of bleeding.   #Macrocytic anemia #Thrombocytopenia: --Stable for the last 2-3 years. --Workup from 03/12/2022 ruled out nutritional deficiencies, hepatitis B/C, HIV --Abdominal US from 03/25/2022 showed mild fatty liver otherwise no other liver disease or splenomegaly --Discussed monitoring versus proceeding with bone marrow biopsy.Patient would like to monitor for now since levels have been stable for some time. --Consider bone marrow biopsy if Hgb <10 or Plt <100K  No orders of the defined types were placed in this encounter.   All questions were answered. The patient knows to call the clinic with any problems, questions or concerns.  I have spent a total of 25 minutes minutes of face-to-face and non-face-to-face time, preparing to see the patient, obtaining and/or reviewing separately obtained history, performing a medically appropriate examination, counseling and educating the patient, documenting clinical information in the electronic health record and care coordination.   [name] Query, PA-C Department of Hematology/Oncology [place] [facility] Phone: [phone]

## 2022-09-04 DIAGNOSIS — L97812 Non-pressure chronic ulcer of other part of right lower leg with fat layer exposed: Secondary | ICD-10-CM | POA: Diagnosis not present

## 2022-09-04 DIAGNOSIS — I10 Essential (primary) hypertension: Secondary | ICD-10-CM | POA: Diagnosis not present

## 2022-09-04 DIAGNOSIS — L942 Calcinosis cutis: Secondary | ICD-10-CM | POA: Diagnosis not present

## 2022-09-04 DIAGNOSIS — L03116 Cellulitis of left lower limb: Secondary | ICD-10-CM | POA: Diagnosis not present

## 2022-09-04 DIAGNOSIS — I872 Venous insufficiency (chronic) (peripheral): Secondary | ICD-10-CM | POA: Diagnosis not present

## 2022-09-04 DIAGNOSIS — L97522 Non-pressure chronic ulcer of other part of left foot with fat layer exposed: Secondary | ICD-10-CM | POA: Diagnosis not present

## 2022-09-06 DIAGNOSIS — L942 Calcinosis cutis: Secondary | ICD-10-CM | POA: Diagnosis not present

## 2022-09-06 DIAGNOSIS — I872 Venous insufficiency (chronic) (peripheral): Secondary | ICD-10-CM | POA: Diagnosis not present

## 2022-09-06 DIAGNOSIS — L97812 Non-pressure chronic ulcer of other part of right lower leg with fat layer exposed: Secondary | ICD-10-CM | POA: Diagnosis not present

## 2022-09-06 DIAGNOSIS — L97522 Non-pressure chronic ulcer of other part of left foot with fat layer exposed: Secondary | ICD-10-CM | POA: Diagnosis not present

## 2022-09-06 DIAGNOSIS — L03116 Cellulitis of left lower limb: Secondary | ICD-10-CM | POA: Diagnosis not present

## 2022-09-06 DIAGNOSIS — I10 Essential (primary) hypertension: Secondary | ICD-10-CM | POA: Diagnosis not present

## 2022-09-08 ENCOUNTER — Encounter (HOSPITAL_BASED_OUTPATIENT_CLINIC_OR_DEPARTMENT_OTHER): Payer: Medicare Other | Admitting: General Surgery

## 2022-09-08 DIAGNOSIS — I890 Lymphedema, not elsewhere classified: Secondary | ICD-10-CM | POA: Diagnosis not present

## 2022-09-08 DIAGNOSIS — I872 Venous insufficiency (chronic) (peripheral): Secondary | ICD-10-CM | POA: Diagnosis not present

## 2022-09-08 DIAGNOSIS — I110 Hypertensive heart disease with heart failure: Secondary | ICD-10-CM | POA: Diagnosis not present

## 2022-09-08 DIAGNOSIS — I739 Peripheral vascular disease, unspecified: Secondary | ICD-10-CM | POA: Diagnosis not present

## 2022-09-08 DIAGNOSIS — L97522 Non-pressure chronic ulcer of other part of left foot with fat layer exposed: Secondary | ICD-10-CM | POA: Diagnosis not present

## 2022-09-08 DIAGNOSIS — L942 Calcinosis cutis: Secondary | ICD-10-CM | POA: Diagnosis not present

## 2022-09-08 DIAGNOSIS — L97812 Non-pressure chronic ulcer of other part of right lower leg with fat layer exposed: Secondary | ICD-10-CM | POA: Diagnosis not present

## 2022-09-09 NOTE — Progress Notes (Signed)
[name] ([id number]) [id number] Page 1 of 11 Visit Report for 09/08/2022 Arrival Information Details Patient Name: Date of Service: [name], [name] 09/08/2022 12:30 PM Medical Record Number: [id number] Patient Account Number: [id number] Date of Birth/Sex: Treating RN: Feb 21, 1951 (72 y.o. [name] Primary Care [name]: PA [name], [place] Other Clinician: Referring [name]: Treating Audi Wettstein/Extender: [name] in Treatment: 30 Visit Information History Since Last Visit Added or deleted any medications: [name] Patient Arrived: Wheel Chair Any new allergies or adverse reactions: [name] Arrival Time: 12:29 Had a fall or experienced change in [name] Accompanied By: self activities of daily living that may affect Transfer Assistance: None risk of falls: Patient Identification Verified: Yes Signs or symptoms of abuse/neglect since last visito [name] Secondary Verification Process Completed: Yes Hospitalized since last visit: [name] Patient Requires Transmission-Based Precautions: [name] Implantable device outside of the clinic excluding [name] Patient Has Alerts: Yes cellular tissue based products placed in the center Patient Alerts: R ABI= N/C, TBI= .75 since last visit: L ABI =N/C, TBI = .57 Has Dressing in Place as Prescribed: Yes Has Compression in Place as Prescribed: Yes Pain Present Now: [name] Electronic Signature(s) Signed: 09/08/2022 4:48:58 PM By: [name] Gouty RN, BSN Entered By: [name] on 09/08/2022 12:30:13 -------------------------------------------------------------------------------- Compression Therapy Details Patient Name: Date of Service: [name], [name] 09/08/2022 12:30 PM Medical Record Number: [id number] Patient Account Number: [id number] Date of Birth/Sex: Treating RN: Feb 14, 1951 (72 y.o. [name] Primary Care [name]: PA [name], [place] Other Clinician: Referring [name]: Treating [name]/Extender: [name] in Treatment: 30 Compression Therapy Performed for Wound Assessment: Wound #1 Left,Dorsal Foot Performed By: Clinician [name] Gouty, RN Compression Type: Four Layer Post Procedure Diagnosis Same as Pre-procedure Electronic Signature(s) Signed: 09/08/2022 4:48:58 PM By: [name] Gouty RN, BSN Entered By: [name] on 09/08/2022 13:01:33 [name] ([id number].pdf Page 2 of 11 -------------------------------------------------------------------------------- Compression Therapy Details Patient Name: Date of Service: [name] 09/08/2022 12:30 PM Medical Record Number: [id number] Patient Account Number: [id number] Date of Birth/Sex: Treating RN: 1951-05-26 (72 y.o. [name] Primary Care [name]: PA [name], [place] Other Clinician: Referring [name]: Treating [name]/Extender: [name] in Treatment: 30 Compression Therapy Performed for Wound Assessment: Wound #3 Right,Medial Lower Leg Performed By: Clinician [name] Gouty, RN Compression Type: Four Layer Post Procedure Diagnosis Same as Pre-procedure Electronic Signature(s) Signed: 09/08/2022 4:48:58 PM By: [name] Gouty RN, BSN Entered By: [name] on 09/08/2022 13:01:33 -------------------------------------------------------------------------------- Encounter Discharge Information Details Patient Name: Date of Service: [name], [name] 09/08/2022 12:30 PM Medical Record Number: [id number] Patient Account Number: [id number] Date of Birth/Sex: Treating RN: 1951/05/20 (72 y.o. [name] Primary Care [name]: PA [name], [place] Other Clinician: Referring [name]: Treating [name]/Extender: [name] in Treatment: 30 Encounter Discharge Information Items Post Procedure Vitals Discharge Condition: Stable Temperature (F): 98.3 Ambulatory Status: Wheelchair Pulse (bpm): 66 Discharge  Destination: Home Respiratory Rate (breaths/min): 18 Transportation: Private Auto Blood Pressure (mmHg): 163/79 Accompanied By: self Schedule Follow-up Appointment: Yes Clinical Summary of Care: Patient Declined Electronic Signature(s) Signed: 09/08/2022 4:48:58 PM By: [name] Gouty RN, BSN Entered By: [name] on 09/08/2022 16:45:22 -------------------------------------------------------------------------------- Lower Extremity Assessment Details Patient Name: Date of Service: [name], [name] 09/08/2022 12:30 PM Medical Record Number: [id number] Patient Account Number: [id number] Date of Birth/Sex: Treating RN: [name] (72 y.o. [name] Primary Care [name]: PA [name], [place] Other Clinician: Referring [name]: Treating [name]/Extender: [name] in Treatment: 30 Edema Assessment Assessed: [Left:  [name]] [Right: [name]] Edema: [Left: Yes] [Right: Yes] Calf [name] ([id number]) [id number] Page 3 of 11 Left: Right: Point of Measurement: From Medial Instep 42 cm 42 cm Ankle Left: Right: Point of Measurement: From Medial Instep 24.5 cm 27 cm Vascular Assessment Pulses: Dorsalis Pedis Palpable: [Left:[name]] [Right:[name]] Electronic Signature(s) Signed: 09/08/2022 4:48:58 PM By: [name] Gouty RN, BSN Entered By: [name] on 09/08/2022 12:49:20 -------------------------------------------------------------------------------- Multi Wound Chart Details Patient Name: Date of Service: [name] 09/08/2022 12:30 PM Medical Record Number: [id number] Patient Account Number: [id number] Date of Birth/Sex: Treating RN: Jan 10, 1951 (72 y.o. M) Primary Care Quatavious Rossa: PA [name] Other Clinician: Referring Nohely Whitehorn: Treating [name]/Extender: [name], [name] in Treatment: 30 Vital Signs Height(in): 71 Pulse(bpm): 80 Weight(lbs): 267 Blood Pressure(mmHg): 163/79 Body Mass Index(BMI):  37.2 Temperature(F): 98 Respiratory Rate(breaths/min): 18 [1:Photos:] Left, Dorsal Foot Right, Medial Lower Leg Right, Anterior Lower Leg Wound Location: Gradually Appeared Gradually Appeared Gradually Appeared Wounding Event: Lymphedema Lymphedema Calcinosis Primary Etiology: Cataracts, Anemia, Lymphedema, Cataracts, Anemia, Lymphedema, Cataracts, Anemia, Lymphedema, Comorbid History: Congestive Heart Failure, Deep Vein Congestive Heart Failure, Deep Vein Congestive Heart Failure, Deep Vein Thrombosis, Hypertension, Peripheral Thrombosis, Hypertension, Peripheral Thrombosis, Hypertension, Peripheral Venous Disease Venous Disease Venous Disease 12/07/2021 12/07/2021 05/12/2022 Date Acquired: 30 30 17 $ [name] of Treatment: Open Open Open Wound Status: [name] [name] Wound Recurrence: 0.7x0.7x0.1 3.2x6x0.6 0.3x0.3x0.1 Measurements L x W x D (cm) 0.385 15.08 0.071 A (cm) : rea 0.038 9.048 0.007 Volume (cm) : 89.80% -182.30% 69.90% % Reduction in Area: 97.50% -323.60% 70.80% % Reduction in Volume: Full Thickness Without Exposed Full Thickness With Exposed Support Full Thickness Without Exposed Classification: Support Structures Structures Support Structures Medium Medium Medium Exudate Amount: Serous Serosanguineous Serosanguineous Exudate Type: amber red, brown red, brown Exudate Color: Flat and Intact Distinct, outline attached Epibole Wound Margin: None Present (0%) Medium (34-66%) Large (67-100%) Granulation AmountQUI, [name] ([id number] Page 4 of 11 N/A Red Red Granulation Quality: Large (67-100%) Medium (34-66%) Small (1-33%) Necrotic Amount: Fat Layer (Subcutaneous Tissue): Yes Fat Layer (Subcutaneous Tissue): Yes Fat Layer (Subcutaneous Tissue): Yes Exposed Structures: Fascia: [name] Fascia: [name] Fascia: [name] Tendon: [name] Tendon: [name] Tendon: [name] Muscle: [name] Muscle: [name] Muscle: [name] Joint: [name] Joint: [name] Joint: [name] Bone: [name] Bone:  [name] Bone: [name] Small (1-33%) Small (1-33%) None Epithelialization: Debridement - Selective/Open Wound Debridement - Excisional Debridement - Selective/Open Wound Debridement: Pre-procedure Verification/Time Out 13:00 13:00 13:00 Taken: Lidocaine 4% Topical Solution Lidocaine 4% Topical Solution Lidocaine 4% Topical Solution Pain Control: Slough Other, Subcutaneous, [employer] Tissue Debrided: Non-Viable Tissue Skin/Subcutaneous Tissue Non-Viable Tissue Level: 0.49 19.2 0.09 Debridement A (sq cm): rea Curette Curette Curette Instrument: Minimum Minimum Minimum Bleeding: Pressure Pressure Pressure Hemostasis A chieved: 0 4 0 Procedural Pain: 0 0 0 Post Procedural Pain: Procedure was tolerated well Procedure was tolerated well Procedure was tolerated well Debridement Treatment Response: 0.7x0.7x0.1 3.2x6x0.6 0.3x0.3x0.1 Post Debridement Measurements L x W x D (cm) 0.038 9.048 0.007 Post Debridement Volume: (cm) Excoriation: [name] Excoriation: Yes Excoriation: [name] Periwound Skin Texture: Induration: [name] Callus: [name] Crepitus: [name] Rash: [name] Scarring: [name] Maceration: [name] Maceration: [name] Maceration: [name] Periwound Skin Moisture: Dry/Scaly: [name] Dry/Scaly: [name] Dry/Scaly: [name] Hemosiderin Staining: Yes Hemosiderin Staining: Yes Hemosiderin Staining: Yes Periwound Skin Color: Erythema: [name] Erythema: [name] Atrophie Blanche: [name] Rubor: [name] Rubor: [name] Cyanosis: [name] Ecchymosis: [name] Erythema: [name] Mottled: [name] Pallor: [name] Rubor: [name] Hot [name] Abnormality [name] Abnormality Temperature: Yes Yes N/A Tenderness on Palpation: Compression Therapy Compression Therapy Debridement Procedures Performed: Debridement  Debridement Treatment Notes Electronic Signature(s) Signed: 09/08/2022 1:22:40 PM By: Fredirick Maudlin MD FACS Entered By: Fredirick Maudlin on 09/08/2022 13:22:39 -------------------------------------------------------------------------------- Multi-Disciplinary Care Plan Details Patient  Name: Date of Service: [name], [name] 09/08/2022 12:30 PM Medical Record Number: [id number] Patient Account Number: [id number] Date of Birth/Sex: Treating RN: [birth year] (72 y.o. [name] Primary Care Iosefa Weintraub: PA [name], [place] Other Clinician: Referring Alois Colgan: Treating Deanza Upperman/Extender: [name] in Treatment: [place] reviewed with physician Active Inactive Venous Leg Ulcer Nursing Diagnoses: Knowledge deficit related to disease process and management Potential for venous Insuffiency (use before diagnosis confirmed) MELFORD, SAWHNEY ([id number]) [id number] Page 5 of 11 Goals: Patient will maintain optimal edema control Date Initiated: 02/17/2022 Target Resolution Date: 09/29/2022 Goal Status: Active Interventions: Assess peripheral edema status every visit. Compression as ordered Provide education on venous insufficiency Treatment Activities: Non-invasive vascular studies : 02/17/2022 Therapeutic compression applied : 02/17/2022 Notes: Wound/Skin Impairment Nursing Diagnoses: Impaired tissue integrity Knowledge deficit related to ulceration/compromised skin integrity Goals: Patient/caregiver will verbalize understanding of skin care regimen Date Initiated: 02/17/2022 Target Resolution Date: 09/29/2022 Goal Status: Active Ulcer/skin breakdown will have a volume reduction of 30% by week 4 Date Initiated: 02/17/2022 Date Inactivated: 03/10/2022 Target Resolution Date: 03/10/2022 Unmet Reason: infection being treated, Goal Status: Unmet waiting on derm appte Ulcer/skin breakdown will have a volume reduction of 50% by week 8 Date Initiated: 03/10/2022 Date Inactivated: 04/14/2022 Target Resolution Date: 04/07/2022 Goal Status: Unmet Unmet Reason: calcinosis Ulcer/skin breakdown will have a volume reduction of 80% by week 12 Date Initiated: 04/14/2022 Date Inactivated: 05/05/2022 Target Resolution Date:  05/05/2022 Unmet Reason: infection, chronic Goal Status: Unmet condition Interventions: Assess patient/caregiver ability to obtain necessary supplies Assess patient/caregiver ability to perform ulcer/skin care regimen upon admission and as needed Assess ulceration(s) every visit Provide education on ulcer and skin care Treatment Activities: Skin care regimen initiated : 02/17/2022 Topical wound management initiated : 02/17/2022 Notes: Electronic Signature(s) Signed: 09/08/2022 4:48:58 PM By: [name] Gouty RN, BSN Entered By: [name] on 09/08/2022 12:55:19 -------------------------------------------------------------------------------- Pain Assessment Details Patient Name: Date of Service: [name], [name] 09/08/2022 12:30 PM Medical Record Number: [id number] Patient Account Number: [id number] Date of Birth/Sex: Treating RN: [birth year] (72 y.o. [name] Primary Care Lorece Keach: PA [name], [place] Other Clinician: Referring Zayvion Stailey: Treating Matthe Sloane/Extender: [name] in Treatment: 30 Active Problems Location of Pain Severity and Description of Pain Patient Has Paino [name] Site Locations [place], [place] ([id number]) [id number] Page 6 of 11 Rate the pain. Current Pain Level: 0 Pain Management and Medication Current Pain Management: Electronic Signature(s) Signed: 09/08/2022 4:48:58 PM By: [name] Gouty RN, BSN Entered By: [name] on 09/08/2022 12:30:55 -------------------------------------------------------------------------------- Patient/Caregiver Education Details Patient Name: Date of Service: [name], [name] 2/14/2024andnbsp12:30 PM Medical Record Number: [id number] Patient Account Number: [id number] Date of Birth/Gender: Treating RN: [birth year] (72 y.o. [name] Primary Care Physician: PA [name], [place] Other Clinician: Referring Physician: Treating Physician/Extender: [name] in Treatment: 30 Education Assessment Education Provided To: Patient Education Topics Provided Venous: Methods: Explain/Verbal Responses: Reinforcements needed, State content correctly Wound/Skin Impairment: Methods: Explain/Verbal Responses: Reinforcements needed, State content correctly Electronic Signature(s) Signed: 09/08/2022 4:48:58 PM By: [name] Gouty RN, BSN Entered By: [name] on 09/08/2022 12:56:00 -------------------------------------------------------------------------------- Wound Assessment Details Patient Name: Date of Service: [name] 09/08/2022 12:30 PM [name] ([id number] Page 7 of 11 Medical Record Number: [id number] Patient Account Number: [id number] Date of Birth/Sex: Treating RN: June 12, 1951 (72 y.o. M)  [name] Primary Care Jatavis Malek: PA [name], [place] Other Clinician: Referring Malik Ruffino: Treating Hershell Brandl/Extender: [name] in Treatment: 30 Wound Status Wound Number: 1 Primary Lymphedema Etiology: Wound Location: Left, Dorsal Foot Wound Open Wounding Event: Gradually Appeared Status: Date Acquired: 12/07/2021 Comorbid Cataracts, Anemia, Lymphedema, Congestive Heart Failure, Deep [name] Of Treatment: 30 History: Vein Thrombosis, Hypertension, Peripheral Venous Disease Clustered Wound: [name] Photos Wound Measurements Length: (cm) 0.7 Width: (cm) 0.7 Depth: (cm) 0.1 Area: (cm) 0.385 Volume: (cm) 0.038 % Reduction in Area: 89.8% % Reduction in Volume: 97.5% Epithelialization: Small (1-33%) Tunneling: [name] Undermining: [name] Wound Description Classification: Full Thickness Without Exposed Suppor Wound Margin: Flat and Intact Exudate Amount: Medium Exudate Type: Serous Exudate Color: amber t Structures Foul Odor After Cleansing: [name] Slough/Fibrino Yes Wound Bed Granulation Amount: None Present (0%) Exposed Structure Necrotic Amount: Large  (67-100%) Fascia Exposed: [name] Necrotic Quality: Adherent Slough Fat Layer (Subcutaneous Tissue) Exposed: Yes Tendon Exposed: [name] Muscle Exposed: [name] Joint Exposed: [name] Bone Exposed: [name] Periwound Skin Texture Texture Color [name] Abnormalities Noted: [name] Abnormalities Noted: [name] Excoriation: [name] Erythema: [name] Hemosiderin Staining: Yes Moisture Rubor: [name] Abnormalities Noted: [name] Dry / Scaly: [name] Temperature / Pain Maceration: [name] Temperature: Hot Tenderness on Palpation: Yes Treatment Notes Wound #1 (Foot) Wound Laterality: Dorsal, Left Cleanser Peri-Wound Care Zinc Oxide Ointment 30g tube Discharge Instruction: Apply Zinc Oxide to periwound with each dressing change as needed for excoriation Sween Lotion (Moisturizing lotion) Discharge Instruction: Apply moisturizing lotion as directed [name], [place] ([id number]) [id number] Page 8 of 11 Topical Primary Dressing Sorbal[place] AG Dressing, 4x4 (in/in) Discharge Instruction: or equivalent silver alginate. Apply to wound bed as instructed Secondary Dressing ABD Pad, 5x9 Discharge Instruction: Apply over primary dressing as directed. Woven Gauze Sponge, Non-Sterile 4x4 in Discharge Instruction: Apply over primary dressing as directed. Secured With Compression Wrap FourPress (4 layer compression wrap) Discharge Instruction: Apply four layer compression as directed. May also use Urgo K2 compression system as alternative. Compression Stockings Add-Ons Electronic Signature(s) Signed: 09/08/2022 4:48:58 PM By: [name] Gouty RN, BSN Entered By: [name] on 09/08/2022 12:54:32 -------------------------------------------------------------------------------- Wound Assessment Details Patient Name: Date of Service: [name], [name] 09/08/2022 12:30 PM Medical Record Number: [id number] Patient Account Number: [id number] Date of Birth/Sex: Treating RN: 03/05/1951 (72 y.o. [name] Primary Care Yesmin Mutch: PA  [name], [place] Other Clinician: Referring Patric Vanpelt: Treating Ladonte Verstraete/Extender: [name] in Treatment: 30 Wound Status Wound Number: 3 Primary Lymphedema Etiology: Wound Location: Right, Medial Lower Leg Wound Open Wounding Event: Gradually Appeared Status: Date Acquired: 12/07/2021 Comorbid Cataracts, Anemia, Lymphedema, Congestive Heart Failure, Deep [name] Of Treatment: 30 History: Vein Thrombosis, Hypertension, Peripheral Venous Disease Clustered Wound: [name] Photos Wound Measurements Length: (cm) 3.2 Width: (cm) 6 Depth: (cm) 0.6 Area: (cm) 15.08 Volume: (cm) 9.048 % Reduction in Area: -182.3% % Reduction in Volume: -323.6% Epithelialization: Small (1-33%) Tunneling: [name] Undermining: [name] Wound Description Classification: Full Thickness With Exposed Support Structures TULLY, SAK[place] ([id number]) Wound Margin: Distinct, outline attached Exudate Amount: Medium Exudate Type: Serosanguineous Exudate Color: red, brown Foul Odor After Cleansing: [name] [id number] Page 9 of 11 Slough/Fibrino Yes Wound Bed Granulation Amount: Medium (34-66%) Exposed Structure Granulation Quality: Red Fascia Exposed: [name] Necrotic Amount: Medium (34-66%) Fat Layer (Subcutaneous Tissue) Exposed: Yes Necrotic Quality: Adherent Slough Tendon Exposed: [name] Muscle Exposed: [name] Joint Exposed: [name] Bone Exposed: [name] Periwound Skin Texture Texture Color [name] Abnormalities Noted: [name] Abnormalities Noted: [name] Excoriation: Yes Erythema: [name] Hemosiderin Staining: Yes Moisture Rubor: [name] Abnormalities Noted:  [name] Dry / Scaly: [name] Temperature / Pain Maceration: [name] Temperature: [name] Abnormality Tenderness on Palpation: Yes Treatment Notes Wound #3 (Lower Leg) Wound Laterality: Right, Medial Cleanser Peri-Wound Care Triamcinolone 15 (g) Discharge Instruction: Use triamcinolone 15 (g) as directed Zinc Oxide Ointment 30g tube Discharge Instruction: Apply Zinc  Oxide to excoriated periwound with each dressing change as needed Sween Lotion (Moisturizing lotion) Discharge Instruction: Apply moisturizing lotion as directed Topical Primary Dressing Santyl Ointment Discharge Instruction: to wound bed mixed 1:1 with sodium thiosulfate ointment sodium thiosulfate ointment Discharge Instruction: 1:1 mix with santyl to wound bed Secondary Dressing ABD Pad, 8x10 Discharge Instruction: Apply over primary dressing as directed. Woven Gauze Sponge, Non-Sterile 4x4 in Discharge Instruction: Apply over primary dressing as directed. Secured With Compression Wrap FourPress (4 layer compression wrap) Discharge Instruction: Apply four layer compression as directed. May also use Urgo K2 compression system as alternative. Compression Stockings Add-Ons Electronic Signature(s) Signed: 09/08/2022 4:48:58 PM By: [name] Gouty RN, BSN Entered By: [name] on 09/08/2022 12:55:09 [name] ([id number] Page 10 of 11 -------------------------------------------------------------------------------- Wound Assessment Details Patient Name: Date of Service: [name], [name] 09/08/2022 12:30 PM Medical Record Number: [id number] Patient Account Number: [id number] Date of Birth/Sex: Treating RN: 11-19-1950 (72 y.o. [name] Primary Care Tawona Filsinger: PA [name], [place] Other Clinician: Referring Tion Tse: Treating Akera Snowberger/Extender: [name] in Treatment: 30 Wound Status Wound Number: 6 Primary Calcinosis Etiology: Wound Location: Right, Anterior Lower Leg Wound Open Wounding Event: Gradually Appeared Status: Date Acquired: 05/12/2022 Comorbid Cataracts, Anemia, Lymphedema, Congestive Heart Failure, Deep [name] Of Treatment: 17 History: Vein Thrombosis, Hypertension, Peripheral Venous Disease Clustered Wound: [name] Photos Wound Measurements Length: (cm) 0 Width: (cm) 0 Depth: (cm) 0 Area:  (cm) Volume: (cm) .3 % Reduction in Area: 69.9% .3 % Reduction in Volume: 70.8% .1 Epithelialization: None 0.071 Tunneling: [name] 0.007 Undermining: [name] Wound Description Classification: Full Thickness Without Exposed Suppor Wound Margin: Epibole Exudate Amount: Medium Exudate Type: Serosanguineous Exudate Color: red, brown t Structures Foul Odor After Cleansing: [name] Slough/Fibrino Yes Wound Bed Granulation Amount: Large (67-100%) Exposed Structure Granulation Quality: Red Fascia Exposed: [name] Necrotic Amount: Small (1-33%) Fat Layer (Subcutaneous Tissue) Exposed: Yes Necrotic Quality: Adherent Slough Tendon Exposed: [name] Muscle Exposed: [name] Joint Exposed: [name] Bone Exposed: [name] Periwound Skin Texture Texture Color [name] Abnormalities Noted: Yes [name] Abnormalities Noted: [name] Atrophie Blanche: [name] Moisture Cyanosis: [name] Abnormalities Noted: Yes Ecchymosis: [name] Erythema: [name] Hemosiderin Staining: Yes Mottled: [name] Pallor: [name] Rubor: [name] Temperature / Pain Temperature: [name] Abnormality Treatment Notes Wound #6 (Lower Leg) Wound Laterality: Right, Anterior ALYAS, COPPESS ([id number] Page 11 of 11 Peri-Wound Care Triamcinolone 15 (g) Discharge Instruction: Use triamcinolone 15 (g) ( in clinic) Zinc Oxide Ointment 30g tube Discharge Instruction: Apply Zinc Oxide to periwound with each dressing change as needed for excoriation Sween Lotion (Moisturizing lotion) Discharge Instruction: Apply moisturizing lotion as directed Topical Primary Dressing sodium thiosulfate ointment Discharge Instruction: to wound bed with each dressing change Secondary Dressing ABD Pad, 5x9 Discharge Instruction: Apply over primary dressing as directed. Woven Gauze Sponge, Non-Sterile 4x4 in Discharge Instruction: Apply over primary dressing as directed. Secured With Compression Wrap FourPress (4 layer compression wrap) Discharge Instruction: Apply four layer  compression as directed. May also use Urgo K2 compression system as alternative. Compression Stockings Add-Ons Electronic Signature(s) Signed: 09/08/2022 4:48:58 PM By: [name] Gouty RN, BSN Entered By: [name] on 09/08/2022 12:55:38 -------------------------------------------------------------------------------- Vitals Details Patient Name: Date of Service: [name], [name] 09/08/2022 12:30 PM  Medical Record Number: [id number] Patient Account Number: [id number] Date of Birth/Sex: Treating RN: [birth year] (72 y.o. [name] Primary Care Jamont Mellin: PA [name], [place] Other Clinician: Referring Emyah Roznowski: Treating Alexandru Moorer/Extender: [name] in Treatment: 30 Vital Signs Time Taken: 12:30 Temperature (F): 98 Height (in): 71 Pulse (bpm): 66 Weight (lbs): 267 Respiratory Rate (breaths/min): 18 Body Mass Index (BMI): 37.2 Blood Pressure (mmHg): 163/79 Reference Range: 80 - 120 mg / dl Electronic Signature(s) Signed: 09/08/2022 4:48:58 PM By: [name] Gouty RN, BSN Entered By: [name] on 09/08/2022 12:30:43

## 2022-09-09 NOTE — Progress Notes (Signed)
[name] ([id number]) [id number] Page 1 of 16 Visit Report for 09/08/2022 Chief Complaint Document Details Patient Name: Date of Service: [name], [name] 09/08/2022 12:30 PM Medical Record Number: [id number] Patient Account Number: [id number] Date of Birth/Sex: Treating RN: 12-09-1950 (72 y.o. M) Primary Care Provider: PA [name], NO Other Clinician: Referring Provider: Treating Provider/Extender: [name] in Treatment: 30 Information Obtained from: Patient Chief Complaint Patient seen for complaints of Non-Healing Wounds. Electronic Signature(s) Signed: 09/08/2022 1:22:46 PM By: [name] Maudlin MD FACS Entered By: [name] on 09/08/2022 13:22:46 -------------------------------------------------------------------------------- Debridement Details Patient Name: Date of Service: [name], [name] 09/08/2022 12:30 PM Medical Record Number: [id number] Patient Account Number: [id number] Date of Birth/Sex: Treating RN: 1950-10-10 (72 y.o. [name] Primary Care Provider: PA [name], [place] Other Clinician: Referring Provider: Treating Provider/Extender: [name] in Treatment: 30 Debridement Performed for Assessment: Wound #1 Left,Dorsal Foot Performed By: Physician [name] Maudlin, MD Debridement Type: Debridement Level of Consciousness (Pre-procedure): Awake and Alert Pre-procedure Verification/Time Out Yes - 13:00 Taken: Start Time: 13:00 Pain Control: Lidocaine 4% T opical Solution T Area Debrided (L x W): otal 0.7 (cm) x 0.7 (cm) = 0.49 (cm) Tissue and other material debrided: Non-Viable, Slough, Slough Level: Non-Viable Tissue Debridement Description: Selective/Open Wound Instrument: Curette Bleeding: Minimum Hemostasis Achieved: Pressure Procedural Pain: 0 Post Procedural Pain: 0 Response to Treatment: Procedure was tolerated well Level of Consciousness (Post- Awake and  Alert procedure): Post Debridement Measurements of Total Wound Length: (cm) 0.7 Width: (cm) 0.7 Depth: (cm) 0.1 Volume: (cm) 0.038 Character of Wound/Ulcer Post Debridement: Improved Post Procedure Diagnosis Same as [name], [name] ([id number]) [id number] Page 2 of 16 Notes Scribed for Dr. Celine [name] by [name] Gouty, RN Electronic Signature(s) Signed: 09/08/2022 1:52:57 PM By: [name] Maudlin MD FACS Signed: 09/08/2022 4:48:58 PM By: [name] Gouty RN, BSN Entered By: [name] on 09/08/2022 13:03:26 -------------------------------------------------------------------------------- Debridement Details Patient Name: Date of Service: [name], [name] 09/08/2022 12:30 PM Medical Record Number: [id number] Patient Account Number: [id number] Date of Birth/Sex: Treating RN: [birth year] (72 y.o. [name] Primary Care Provider: PA [name], [place] Other Clinician: Referring Provider: Treating Provider/Extender: [name] in Treatment: 30 Debridement Performed for Assessment: Wound #6 Right,Anterior Lower Leg Performed By: Physician [name] Maudlin, MD Debridement Type: Debridement Level of Consciousness (Pre-procedure): Awake and Alert Pre-procedure Verification/Time Out Yes - 13:00 Taken: Start Time: 13:00 Pain Control: Lidocaine 4% T opical Solution T Area Debrided (L x W): otal 0.3 (cm) x 0.3 (cm) = 0.09 (cm) Tissue and other material debrided: Non-Viable, Slough, Slough Level: Non-Viable Tissue Debridement Description: Selective/Open Wound Instrument: Curette Bleeding: Minimum Hemostasis Achieved: Pressure Procedural Pain: 0 Post Procedural Pain: 0 Response to Treatment: Procedure was tolerated well Level of Consciousness (Post- Awake and Alert procedure): Post Debridement Measurements of Total Wound Length: (cm) 0.3 Width: (cm) 0.3 Depth: (cm) 0.1 Volume: (cm) 0.007 Character of Wound/Ulcer Post  Debridement: Improved Post Procedure Diagnosis Same as Pre-procedure Notes Scribed for Dr. Celine [name] by [name] Gouty, RN Electronic Signature(s) Signed: 09/08/2022 1:52:57 PM By: [name] Maudlin MD FACS Signed: 09/08/2022 4:48:58 PM By: [name] Gouty RN, BSN Entered By: [name] on 09/08/2022 13:03:59 Debridement Details -------------------------------------------------------------------------------- [name] ([id number]) [id number] Page 3 of 16 Patient Name: Date of Service: [name], [name] 09/08/2022 12:30 PM Medical Record Number: [id number] Patient Account Number: [id number] Date of Birth/Sex: Treating RN: 17-Oct-1950 (72 y.o. [name] Primary Care Provider: PA [name], [place] Other Clinician: Referring Provider: Treating Provider/Extender: [name],  [name] in Treatment: 30 Debridement Performed for Assessment: Wound #3 Right,Medial Lower Leg Performed By: Physician [name] Maudlin, MD Debridement Type: Debridement Level of Consciousness (Pre-procedure): Awake and Alert Pre-procedure Verification/Time Out Yes - 13:00 Taken: Start Time: 13:00 Pain Control: Lidocaine 4% T opical Solution T Area Debrided (L x W): otal 3.2 (cm) x 6 (cm) = 19.2 (cm) Tissue and other material debrided: Viable, Non-Viable, Slough, Subcutaneous, Slough, Other: calcium deposits Level: Skin/Subcutaneous Tissue Debridement Description: Excisional Instrument: Curette Bleeding: Minimum Hemostasis Achieved: Pressure Procedural Pain: 4 Post Procedural Pain: 0 Response to Treatment: Procedure was tolerated well Level of Consciousness (Post- Awake and Alert procedure): Post Debridement Measurements of Total Wound Length: (cm) 3.2 Width: (cm) 6 Depth: (cm) 0.6 Volume: (cm) 9.048 Character of Wound/Ulcer Post Debridement: Improved Post Procedure Diagnosis Same as Pre-procedure Notes Scribed for Dr. Celine [name] by [name] Gouty,  RN Electronic Signature(s) Signed: 09/08/2022 1:52:57 PM By: [name] Maudlin MD FACS Signed: 09/08/2022 4:48:58 PM By: [name] Gouty RN, BSN Entered By: [name] on 09/08/2022 13:05:30 -------------------------------------------------------------------------------- HPI Details Patient Name: Date of Service: [name], [name] 09/08/2022 12:30 PM Medical Record Number: [id number] Patient Account Number: [id number] Date of Birth/Sex: Treating RN: 18-Apr-1951 (72 y.o. M) Primary Care Provider: PA [name], NO Other Clinician: Referring Provider: Treating Provider/Extender: [name] in Treatment: 30 History of Present Illness HPI Description: ADMISSION 02/09/2022 This is a 72 year old man who recently moved to [place] from [place]. He has a history of of coronary artery disease and prior left popliteal vein DVT . He is not diabetic and does not smoke. 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 He has had multiple spinal surgeries and has limited mobility. He primarily uses a motorized wheelchair. He has had lymphedema for many years and does have lymphedema pumps although he says he does not use them frequently. He has wounds on his bilateral lower extremities. He was receiving care in [place] for these prior to his move. They were apparently packing his wounds with Betadine soaked gauze. He has worn compression wraps in the past but not recently. He did say that they helped tremendously. In clinic today, his right ABI is noncompressible but has good signals; the left ABI was 1.17. No formal vascular studies have been performed. On his left dorsal foot, there is a circular lesion that exposes the fat layer. There is fairly heavy slough accumulation within the wound, but no significant odor. On his right medial calf, he has multiple pinholes that have slough buildup in them and probe for about 2 to 4 mm in depth. They are draining clear fluid. On  his right lateral midfoot, he has ulceration consistent with venous stasis. It is geographic and has slough accumulation. He has changes in his lower extremities GIBBS, STROUGH ([id number]) [id number] Page 4 of 16 consistent with stasis dermatitis, but no hemosiderin deposition or thickening of the skin. 02/17/2022: All of the wounds are little bit larger today and have more slough accumulation. Today, the medial calf openings are larger and probe to something that feels calcified. There is odor coming from his wounds. His vascular studies are scheduled for August 9 and 10. 02-24-2022 upon evaluation today patient presents for follow-up concerning his ongoing issues here with his lower extremities. With that being said he does have significant problems with what appears to be cellulitis unfortunately the PCR culture that was sent last week got crushed by [employer] in transit. With that being said we are going to  reobtain a culture today although I am actually to do it on the right medial leg as this seems to be the most inflamed area today where there is some questionable drainage as well. I am going to remove some of the calcium deposits which are loosening obtain a deeper culture from this area. Fortunately I do not see any evidence of active infection systemically which is good news but locally I think we are having a bigger issue here. He does have his appointment next Thursday with vascular. Patient has also been referred to [facility] for further evaluation and treatment of the calcinosis for possible sulfate injections. 03/04/2022: The culture that was performed last week grew out multiple species including Klebsiella, Morganella, and Pseudomonas aeruginosa. He had been prescribed Bactrim but this was not adequate to cover all of the species and levofloxacin was recommended. He completed the Bactrim but did not pick up the levofloxacin and begin taking it until yesterday. 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 He had arterial studies performed today. He does have evidence of peripheral arterial disease. Results copied here: +-------+-----------+-----------+------------+------------+ ABI/TBIT oday's ABIT oday's TBIPrevious ABIPrevious TBI +-------+-----------+-----------+------------+------------+ Right B[place] 0.75    +-------+-----------+-----------+------------+------------+ Left [place] 0.57    +-------+-----------+-----------+------------+------------+ Arterial wall calcification precludes accurate ankle pressures and ABIs. Summary: Right: Resting right ankle-brachial index indicates noncompressible right lower extremity arteries. The right toe-brachial index is normal. Left: Resting left ankle-brachial index indicates noncompressible left lower extremity arteries. The left toe-brachial index is abnormal. He continues to have further tissue breakdown at the right medial calf and there is ample slough accumulation along with necrotic subcutaneous tissue. The left dorsal foot wound has built up slough, as well. The right medial foot wound is nearly closed with just a light layer of eschar over the surface. The right dorsal lateral foot wound has also accumulated a fair amount of slough and remains quite tender. 03/10/2022: He has been taking the prescribed levofloxacin and has about 3 days left of this. The erythema and induration on his right leg has improved. He continues to experience further breakdown of the right medial calf wound. There is extensive slough and debris accumulation there. There is heavy slough on his left dorsal foot wound, but no odor or purulent drainage. The right medial foot wound appears to be closed. The right dorsal lateral foot wound also has a fair amount of  slough but it is less tender than at our last visit. He still does not have an appointment with dermatology. 03/22/2022: The right anterior tibial wound has closed. The right lateral foot wound is nearly closed with just a little bit of slough. The left dorsal foot wound is smaller and continues to have some slough buildup but less so than at prior visits. There is good granulation tissue underlying the slough. Unfortunately the right medial calf wound continues to deteriorate. It has a foul odor today and a lot of necrotic tissue, the surrounding skin is also erythematous and moist. 03/30/2022: The right lateral foot wound continues to contract and just has a little bit of slough and eschar present. The left dorsal foot wound is also smaller with slough overlying good granulation tissue. The right medial calf wound actually looks quite a bit better today; there is no odor and there is much less necrotic tissue although some remains present. We have been using  topical gentamicin and he has been taking oral Augmentin. 04/07/2022: The patient saw dermatology at [facility] last week. Unfortunately, it appears that they did not read any of the documentation that was provided. 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 Overall, it was a highly unsatisfactory consultation. In the meantime, however, we were able to identify compounding pharmacy who could create a topical sodium thiosulfate ointment. The patient has that with him today. Both of the right foot wounds are now closed. The left dorsal foot wound has contracted but still has a layer of slough on the surface. The medial calf wounds on the right all look a little bit better. They still  have heavy slough along with the chunks of calcium. Everything is stained purple. 04/14/2022: The wound on his dorsal left foot is a little bit smaller again today. There is still slough accumulation on the surface. The medial calf wounds have quite a bit less slough accumulation today. His right leg, however, is warm and erythematous, concerning for cellulitis. 04/14/2022: He completed his course of Augmentin yesterday. The medial calf wound sites are much cleaner today and shallower. There is less fragmented calcium in the wounds. He has a lot of lymphedema fluid-like drainage from these wounds, but no purulent discharge or malodor. The wound on his dorsal left foot has also contracted and is shallower. There is a layer of slough over healthy granulation tissue. 05/05/2022: The left dorsal foot wound is smaller today with less slough and more granulation tissue. The right medial calf wounds are shallower, but have extensive slough and some fat necrosis. The drainage has diminished considerably, however. 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 05/12/2022: The left dorsal foot wound continues to contract and fill with granulation tissue. There is slough on the wound surface. The right medial calf wounds also continue to fill with healthy tissue, but there is remaining slough and fat necrosis present. He had a significant increase in his drainage this week and it was a bright yellow-green color. He also had a new wound open over his right anterior tibial surface associated with the spike of cutaneous calcium. The leg is more red, as well, concerning for infection. 05/19/2022: His culture returned positive for Pseudomonas. He is currently taking a course of oral levofloxacin. We have also been using topical gentamicin mixed in with his sodium thiosulfate. All of the wounds look better  this week. 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 There is slough and nonviable subcutaneous tissue still present. The new anterior tibial wound just has a light layer of slough. The dorsal foot wound also has some slough present and appears a little dry today. 05/26/2022: The right medial leg wound continues to improve. It is feeling with granulation tissue, but still accumulates a fairly thick layer of slough on the surface. The more recent anterior tibial wound has remained quite small, but also has some slough on the surface. The left dorsal foot wound has contracted somewhat and has perimeter epithelialization. He completed his oral levofloxacin. 06/02/2022: The left dorsal foot wound continues to improve significantly. There is just a little slough  on the wound surface. The right medial leg wound had black drainage, but it looks like silver alginate was applied last week and I theorized that silver sulfide was created with a combination of the silver alginate and the JEDAIAH, SETLOCK ([id number]) [id number] Page 5 of 16 sodium thiosulfate. It did, however, have a bit of an odor to it and extensive slough accumulation. The small anterior tibial wound also had a bit of slough and nonviable subcutaneous tissue present. The skin of his leg was rather macerated, as well. 06/09/2022: The left dorsal foot wound is smaller again this week with just a light layer of slough on the surface. The right medial leg wound looks substantially better. The leg and periwound skin are no longer red and macerated. There is good granulation tissue forming with less slough and nonviable tissue. The anterior tibial wound just has a small amount of slough accumulation. 06/16/2022: The left dorsal foot wound continues to contract. His wrap slipped a little bit, however, and his foot is more swollen. The right medial leg wound continues to improve. The underlying  tissue is more vital-appearing and there is less slough. He does have substantial drainage. The small anterior tibial wound has a bit of slough accumulation. 06/23/2022: The left dorsal foot wound is about half the size as it was last week. There is just some light slough on the surface and some eschar buildup around the edges. The right anterior leg wound is small with a little slough on the surface. Unfortunately, the right medial leg wound deteriorated fairly substantially. It is malodorous with gray/green/black discoloration. 06/30/2022: The left dorsal foot wound continues to contract. It is nearly closed with just a light layer of slough present. The right anterior leg wound is about the same size and has a small amount of slough on the surface. The right medial leg wound looks a lot better than it did last week. The odor has abated and the tissue is less pulpy and necrotic-looking. There is still a fairly thick layer of slough present. 12/13; the left dorsal foot wound looks very healthy. There was no need to change the dressing here and no debridement. He has a small area on the right anterior lower leg apparently had not changed much in size. The area on the medial leg is the most problematic area this is large with some depth and a completely nonviable surface today. 07/14/2022: The left dorsal foot wound is smaller today with just a little slough and eschar present. The right anterior lower leg wound is about the same without any significant change. The medial leg wound has a black and green surface and is malodorous. No purulent drainage and the periwound skin is intact. Edema control is suboptimal. 07/21/2022: The dorsal foot wound continues to contract and is nearly closed with just a little bit of slough and eschar on the surface. The right anterior lower leg wound is shallower today but otherwise the dimensions are unchanged. The medial leg wound looks significantly better although it  still has a nonviable surface; the odor and black/green surface has resolved. His culture came back with multiple species sensitive to Augmentin, which was prescribed and a very low level of Pseudomonas, which should be handled by the topical gentamicin we have been using. 07/28/2022: The dorsal foot wound is down to just a very tiny opening with a little eschar on the surface. The right anterior tibial wound is about the same with some slough buildup. The right medial leg wound  looks quite a bit better this week. There is thick rubbery slough on the surface, but the odor and drainage has improved significantly. 08/04/2022: The dorsal foot wound is almost healed, but the periwound skin has broken down. This appears to be secondary to moisture and adhesive from the absorbent pad. The right anterior tibial wound is shallower. The right medial leg wound continues to improve. It is smaller, still with rubbery slough on the surface. No malodor or purulent drainage. 08/11/2022: The dorsal foot wound is about the same size. It is a little bit dry with some slough accumulation. The right anterior tibial wound is also unchanged. The right medial leg wound is filling in further with good granulation tissue. Still with some slough buildup on the surface. 08/18/2022: His left leg is bright red, hot, and swollen with cellulitis. The dorsal foot wound actually looks quite good and is superficial with just a little bit of slough accumulation. The right anterior tibial wound is unchanged. The right medial leg wound has filled in with good granulation tissue but has copious amounts of drainage. 08/25/2022: His cellulitis has responded nicely to the oral antibiotics. His dorsal foot wound is smaller but has a fair amount of slough buildup. The right anterior tibial wound remains stable and unchanged. The right medial leg wound has more granulation tissue under a layer of slough, but continues to have copious drainage. The  drainage is actually causing skin irritation and threatens to result in breakdown. 09/01/2022: He continues to have profuse drainage from his wounds which is resulting in tissue maceration on both the dorsum of his left foot and the periwound distal to his medial leg wound. The dorsal foot wound is nearly healed, despite this. The medial right leg wound is filling in with good granulation tissue. We are working on getting home health assistance to change his dressings more frequently to try and control the drainage better. 09/08/2022: We have had success in controlling his drainage. The tissue maceration has improved significantly and his swelling has come down quite markedly. The medial right leg wound is much more superficial and has substantially less nonviable tissue present. The anterior tibial wound is a little bit larger, however with some nonviable tissue present. Electronic Signature(s) Signed: 09/08/2022 1:23:58 PM By: [name] Maudlin MD FACS Entered By: [name] on 09/08/2022 13:23:58 -------------------------------------------------------------------------------- Physical Exam Details Patient Name: Date of Service: [name], [name] 09/08/2022 12:30 PM Medical Record Number: [id number] Patient Account Number: [id number] Date of Birth/Sex: Treating RN: [birth year] (72 y.o. M) Primary Care Provider: PA [name], NO Other Clinician: Referring Provider: Treating Provider/Extender: [name], [name] in Treatment: 30 Constitutional Hypertensive, asymptomatic. . . . no acute distress. Respiratory Normal work of breathing on room air. ROHN, WELDEN ([id number]) [id number] Page 6 of 16 Notes 09/08/2022: We have had success in controlling his drainage. The tissue maceration has improved significantly and his swelling has come down quite markedly. The medial right leg wound is much more superficial and has substantially less nonviable tissue  present. The anterior tibial wound is a little bit larger, however with some nonviable tissue present. Electronic Signature(s) Signed: 09/08/2022 1:25:25 PM By: [name] Maudlin MD FACS Entered By: [name] on 09/08/2022 13:25:24 -------------------------------------------------------------------------------- Physician Orders Details Patient Name: Date of Service: [name], [name] 09/08/2022 12:30 PM Medical Record Number: [id number] Patient Account Number: [id number] Date of Birth/Sex: Treating RN: May 01, 1951 (72 y.o. [name] Primary Care Provider: PA [name], [place] Other Clinician: Referring Provider: Treating Provider/Extender: [name],  [name] in Treatment: 72 Verbal / Phone Orders: No Diagnosis Coding ICD-10 Coding Code Description [phone] Non-pressure chronic ulcer of other part of right lower leg with fat layer exposed L97.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) I10 Essential (primary) hypertension I25.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 - Dr. Celine [name] Rm 1 Return A Wednesday 2/21 @ 1230 pm Anesthetic Wound #1 Left,Dorsal Foot (In clinic) Topical Lidocaine 4% applied to wound bed Wound #3 Right,Medial Lower Leg (In clinic) Topical Lidocaine 4% applied to wound bed Wound #6 Right,Anterior Lower Leg (In clinic) Topical Lidocaine 4% applied to wound bed Bathing/ Shower/ Hygiene May shower with protection but do not get wound dressing(s) wet. Protect dressing(s) with water repellant cover (for example, large plastic bag) or a cast cover and may then take shower. Edema Control - Lymphedema / SCD / Other Lymphedema Pumps. Use Lymphedema  pumps on leg(s) 2-3 times a day for 45-60 minutes. If wearing any wraps or hose, do not remove them. Continue exercising as instructed. - use 1-2 times per day over wraps Elevate legs to the level of the heart or above for 30 minutes daily and/or when sitting for 3-4 times a day throughout the day. - throughout the day Avoid standing for long periods of time. Exercise regularly [place] wound care orders this week; continue Home Health for wound care. May utilize formulary equivalent dressing for wound treatment orders unless otherwise specified. Dressing changes to be completed by [place] on Monday / Wednesday / Friday except when patient has scheduled visit at [facility]. - pt seen in clinic on Wednesday Other [place], [place] ([id number]) [id number] Page 7 of 16 Wound #1 - Foot Wound Laterality: Dorsal, Left Peri-Wound Care: Zinc Oxide Ointment 30g tube 3 x Per Week/30 Days Discharge Instructions: Apply Zinc Oxide to periwound with each dressing change as needed for excoriation Peri-Wound Care: Sween Lotion (Moisturizing lotion) 3 x Per Week/30 Days Discharge Instructions: Apply moisturizing lotion as directed Prim Dressing: Sorbalgon AG Dressing, 4x4 (in/in) (Generic) 3 x Per Week/30 Days ary Discharge Instructions: or equivalent silver alginate. Apply to wound bed as instructed Secondary Dressing: ABD Pad, 5x9 (Generic) 3 x Per Week/30 Days Discharge Instructions: Apply over primary dressing as directed. Secondary Dressing: Woven Gauze Sponge, Non-Sterile 4x4 in (Generic) 3 x Per Week/30 Days Discharge Instructions: Apply over primary dressing as directed. Compression Wrap: FourPress (4 layer compression wrap) (Generic) 3 x Per Week/30 Days Discharge Instructions: Apply four layer compression as directed. May also use Urgo K2 compression system as alternative. Wound #3 - Lower Leg  Wound Laterality: Right, Medial Peri-Wound Care: Triamcinolone 15 (g) 1 x Per Week/30 Days Discharge Instructions: Use triamcinolone 15 (g) as directed Peri-Wound Care: Zinc Oxide Ointment 30g tube 1 x Per Week/30 Days Discharge Instructions: Apply Zinc Oxide to excoriated periwound with each dressing change as needed Peri-Wound Care: Sween Lotion (Moisturizing lotion) 1 x Per Week/30 Days Discharge Instructions: Apply moisturizing lotion as directed Prim Dressing: Santyl Ointment 1 x Per Week/30 Days ary Discharge Instructions: to wound bed mixed 1:1 with sodium thiosulfate ointment Prim Dressing: sodium thiosulfate ointment 1 x Per Week/30 Days ary Discharge Instructions: 1:1 mix with santyl to wound bed Secondary Dressing: ABD Pad, 8x10 (  Generic) 1 x Per Week/30 Days Discharge Instructions: Apply over primary dressing as directed. Secondary Dressing: Woven Gauze Sponge, Non-Sterile 4x4 in (Generic) 1 x Per Week/30 Days Discharge Instructions: Apply over primary dressing as directed. Compression Wrap: FourPress (4 layer compression wrap) (Generic) 1 x Per Week/30 Days Discharge Instructions: Apply four layer compression as directed. May also use Urgo K2 compression system as alternative. Wound #6 - Lower Leg Wound Laterality: Right, Anterior Peri-Wound Care: Triamcinolone 15 (g) 3 x Per Week/30 Days Discharge Instructions: Use triamcinolone 15 (g) ( in clinic) Peri-Wound Care: Zinc Oxide Ointment 30g tube 3 x Per Week/30 Days Discharge Instructions: Apply Zinc Oxide to periwound with each dressing change as needed for excoriation Peri-Wound Care: Sween Lotion (Moisturizing lotion) 3 x Per Week/30 Days Discharge Instructions: Apply moisturizing lotion as directed Prim Dressing: sodium thiosulfate ointment 3 x Per Week/30 Days ary Discharge Instructions: to wound bed with each dressing change Secondary Dressing: ABD Pad, 5x9 (Generic) 3 x Per Week/30 Days Discharge Instructions: Apply  over primary dressing as directed. Secondary Dressing: Woven Gauze Sponge, Non-Sterile 4x4 in (Generic) 3 x Per Week/30 Days Discharge Instructions: Apply over primary dressing as directed. Compression Wrap: FourPress (4 layer compression wrap) (Generic) 3 x Per Week/30 Days Discharge Instructions: Apply four layer compression as directed. May also use Urgo K2 compression system as alternative. Electronic Signature(s) Signed: 09/08/2022 1:52:57 PM By: [name] Maudlin MD FACS Entered By: [name] on 09/08/2022 13:25:36 [name] ([id number] Page 8 of 16 -------------------------------------------------------------------------------- Problem List Details Patient Name: Date of Service: [name], [name] 09/08/2022 12:30 PM Medical Record Number: [id number] Patient Account Number: [id number] Date of Birth/Sex: Treating RN: 07/27/1950 (72 y.o. 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) 02/09/2022 No Yes I10 Essential (primary) hypertension 02/09/2022 No Yes I25.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 02/09/2022 No Yes I89.0 Lymphedema, not elsewhere classified 02/09/2022 No Yes Z86.718 Personal history of other venous thrombosis and embolism 02/09/2022 No Yes Inactive Problems ICD-10 Code Description Active Date Inactive Date L97.512 Non-pressure chronic ulcer of other part of right foot with fat layer  exposed 02/17/2022 02/17/2022 Resolved Problems Electronic Signature(s) Signed: 09/08/2022 1:22:26 PM By: [name] Maudlin MD FACS Entered By: [name] on 09/08/2022 13:22:26 [name] ([id number]) [id number] Page 9 of 16 -------------------------------------------------------------------------------- Progress Note Details Patient Name: Date of Service: [name], [name] 09/08/2022 12:30 PM Medical Record Number: [id number] Patient Account Number: [id number] Date of Birth/Sex: Treating RN: [birth year] (72 y.o. M) Primary Care Provider: PA [name], NO Other Clinician: Referring Provider: Treating Provider/Extender: [name] in Treatment: 30 Subjective Chief Complaint Information obtained from Patient Patient seen for complaints of Non-Healing Wounds. History of Present Illness (HPI) ADMISSION 02/09/2022 This is a 72 year old man who recently moved to [place] from [place]. He has a history of of coronary artery disease and prior left popliteal vein DVT . He is not diabetic and does not smoke. 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 He has had multiple spinal surgeries and has limited mobility. He primarily uses a  motorized wheelchair. He has had lymphedema for many years and does have lymphedema pumps although he says he does not use them frequently. He has wounds on his bilateral lower extremities. He was receiving care in [place] for these prior to his move. They were apparently packing his wounds with Betadine soaked gauze. He has worn compression wraps in the past but not recently. He did say that they helped tremendously. In clinic today, his right ABI is noncompressible but has good signals; the left ABI was 1.17. No formal vascular studies have been performed. On his left dorsal foot, there is a circular lesion that exposes the fat layer. There is fairly heavy slough accumulation within the wound, but no  significant odor. On his right medial calf, he has multiple pinholes that have slough buildup in them and probe for about 2 to 4 mm in depth. They are draining clear fluid. On his right lateral midfoot, he has ulceration consistent with venous stasis. It is geographic and has slough accumulation. 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 02/17/2022: All of the wounds are little bit larger today and have more slough accumulation. Today, the medial calf openings are larger and probe to something that feels calcified. There is odor coming from his wounds. His vascular studies are scheduled for August 9 and 10. 02-24-2022 upon evaluation today patient presents for follow-up concerning his ongoing issues here with his lower extremities. With that being said he does have significant problems with what appears to be cellulitis unfortunately the PCR culture that was sent last week got crushed by UPS in transit. With that being said we are going to reobtain a culture today although I am actually to do it on the right medial leg as this seems to be the most inflamed area today where there is some questionable drainage as well. I am going to remove some of the calcium deposits which are loosening obtain a deeper culture from this area. Fortunately I do not see any evidence of active infection systemically which is good news but locally I think we are having a bigger issue here. He does have his appointment next Thursday with vascular. Patient has also been referred to [facility] for further evaluation and treatment of the calcinosis for possible sulfate injections. 03/04/2022: The culture that was performed last week grew out multiple species including Klebsiella, Morganella, and Pseudomonas aeruginosa. He had been prescribed Bactrim but this was not adequate to cover all of the species and levofloxacin was recommended. He  completed the Bactrim but did not pick up the levofloxacin and begin taking it until yesterday. 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 He had arterial studies performed today. He does have evidence of peripheral arterial disease. Results copied here: +-------+-----------+-----------+------------+------------+ ABI/TBIT oday's ABIT oday's TBIPrevious ABIPrevious TBI +-------+-----------+-----------+------------+------------+ Right Logan 0.75    +-------+-----------+-----------+------------+------------+ Left [place] 0.57    +-------+-----------+-----------+------------+------------+ Arterial wall calcification precludes accurate ankle pressures and ABIs. Summary: Right: Resting right ankle-brachial index indicates noncompressible right lower extremity arteries. The right toe-brachial index is normal. Left: Resting left ankle-brachial index indicates noncompressible left lower extremity arteries. The left toe-brachial index is abnormal. He continues to have further tissue breakdown at the right medial calf and there is ample slough accumulation along with necrotic subcutaneous tissue. The left dorsal  foot wound has built up slough, as well. The right medial foot wound is nearly closed with just a light layer of eschar over the surface. The right dorsal lateral foot wound has also accumulated a fair amount of slough and remains quite tender. 03/10/2022: He has been taking the prescribed levofloxacin and has about 3 days left of this. The erythema and induration on his right leg has improved. He continues to experience further breakdown of the right medial calf wound. There is extensive slough and debris accumulation there. There is heavy slough on his left dorsal foot wound, but no odor or purulent drainage. The right  medial foot wound appears to be closed. The right dorsal lateral foot wound also has a fair amount of slough but it is less tender than at our last visit. He still does not have an appointment with dermatology. 03/22/2022: The right anterior tibial wound has closed. The right lateral foot wound is nearly closed with just a little bit of slough. The left dorsal foot wound is smaller and continues to have some slough buildup but less so than at prior visits. There is good granulation tissue underlying the slough. Unfortunately the right medial calf wound continues to deteriorate. It has a foul odor today and a lot of necrotic tissue, the surrounding skin is also erythematous and moist. TREYVAN, PUGLIANO ([id number]) [id number].pdf Page 10 of 16 03/30/2022: The right lateral foot wound continues to contract and just has a little bit of slough and eschar present. The left dorsal foot wound is also smaller with slough overlying good granulation tissue. The right medial calf wound actually looks quite a bit better today; there is no odor and there is much less necrotic tissue although some remains present. We have been using topical gentamicin and he has been taking oral Augmentin. 04/07/2022: The patient saw dermatology at [facility] last week. Unfortunately, it appears that they did not read any of the documentation that was provided. 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 Overall, it was a highly unsatisfactory consultation. In the meantime, however, we were able to identify compounding pharmacy who could create a topical sodium thiosulfate ointment. The patient has that with him today. Both of the right foot wounds are  now closed. The left dorsal foot wound has contracted but still has a layer of slough on the surface. The medial calf wounds on the right all look a little bit better. They still have heavy slough along with the chunks of calcium. Everything is stained purple. 04/14/2022: The wound on his dorsal left foot is a little bit smaller again today. There is still slough accumulation on the surface. The medial calf wounds have quite a bit less slough accumulation today. His right leg, however, is warm and erythematous, concerning for cellulitis. 04/14/2022: He completed his course of Augmentin yesterday. The medial calf wound sites are much cleaner today and shallower. There is less fragmented calcium in the wounds. He has a lot of lymphedema fluid-like drainage from these wounds, but no purulent discharge or malodor. The wound on his dorsal left foot has also contracted and is shallower. There is a layer of slough over healthy granulation tissue. 05/05/2022: The left dorsal foot wound is smaller today with less slough and more  granulation tissue. The right medial calf wounds are shallower, but have extensive slough and some fat necrosis. The drainage has diminished considerably, however. 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 05/12/2022: The left dorsal foot wound continues to contract and fill with granulation tissue. There is slough on the wound surface. The right medial calf wounds also continue to fill with healthy tissue, but there is remaining slough and fat necrosis present. He had a significant increase in his drainage this week and it was a bright yellow-green color. He also had a new wound open over his right anterior tibial surface associated with the spike of cutaneous calcium. The leg is more red, as well, concerning for infection. 05/19/2022: His culture returned positive for  Pseudomonas. He is currently taking a course of oral levofloxacin. We have also been using topical gentamicin mixed in with his sodium thiosulfate. All of the wounds look better this week. 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 There is slough and nonviable subcutaneous tissue still present. The new anterior tibial wound just has a light layer of slough. The dorsal foot wound also has some slough present and appears a little dry today. 05/26/2022: The right medial leg wound continues to improve. It is feeling with granulation tissue, but still accumulates a fairly thick layer of slough on the surface. The more recent anterior tibial wound has remained quite small, but also has some slough on the surface. The left dorsal foot wound has contracted somewhat and has perimeter epithelialization. He completed his oral levofloxacin. 06/02/2022: The left dorsal foot wound continues to improve significantly. There is just a little slough on the wound surface. The right medial leg wound had black drainage, but it looks like silver alginate was applied last week and I theorized that silver sulfide was created with a combination of the silver alginate and the sodium thiosulfate. It did, however, have a bit of an odor to it and extensive slough accumulation. The small anterior tibial wound also had a bit of slough and nonviable subcutaneous tissue present. The skin of his leg was rather macerated, as well. 06/09/2022: The left dorsal foot wound is smaller again this week with just a light layer of slough on the surface. The right medial leg wound looks substantially better. The leg and periwound skin are no longer red and macerated. There is good granulation tissue forming with less slough and nonviable tissue. The anterior tibial wound just has a small amount of slough accumulation. 06/16/2022: The left dorsal foot wound continues to contract. His wrap slipped a little bit,  however, and his foot is more swollen. The right medial leg wound continues to improve. The underlying tissue is more vital-appearing and there is less slough. He does have substantial drainage. The small anterior tibial wound has a bit of slough accumulation. 06/23/2022: The left dorsal foot wound is about half the size as it was last week. There is just some light slough on the surface and some eschar buildup around the edges. The right anterior leg wound is small with a little slough on the surface. Unfortunately, the right medial leg wound deteriorated fairly substantially. It is malodorous with gray/green/black discoloration. 06/30/2022: The left dorsal foot wound continues to contract. It is nearly closed with just a light layer of slough present. The right anterior leg wound is about the same size and has  a small amount of slough on the surface. The right medial leg wound looks a lot better than it did last week. The odor has abated and the tissue is less pulpy and necrotic-looking. There is still a fairly thick layer of slough present. 12/13; the left dorsal foot wound looks very healthy. There was no need to change the dressing here and no debridement. He has a small area on the right anterior lower leg apparently had not changed much in size. The area on the medial leg is the most problematic area this is large with some depth and a completely nonviable surface today. 07/14/2022: The left dorsal foot wound is smaller today with just a little slough and eschar present. The right anterior lower leg wound is about the same without any significant change. The medial leg wound has a black and green surface and is malodorous. No purulent drainage and the periwound skin is intact. Edema control is suboptimal. 07/21/2022: The dorsal foot wound continues to contract and is nearly closed with just a little bit of slough and eschar on the surface. The right anterior lower leg wound is shallower today  but otherwise the dimensions are unchanged. The medial leg wound looks significantly better although it still has a nonviable surface; the odor and black/green surface has resolved. His culture came back with multiple species sensitive to Augmentin, which was prescribed and a very low level of Pseudomonas, which should be handled by the topical gentamicin we have been using. 07/28/2022: The dorsal foot wound is down to just a very tiny opening with a little eschar on the surface. The right anterior tibial wound is about the same with some slough buildup. The right medial leg wound looks quite a bit better this week. There is thick rubbery slough on the surface, but the odor and drainage has improved significantly. 08/04/2022: The dorsal foot wound is almost healed, but the periwound skin has broken down. This appears to be secondary to moisture and adhesive from the absorbent pad. The right anterior tibial wound is shallower. The right medial leg wound continues to improve. It is smaller, still with rubbery slough on the surface. No malodor or purulent drainage. 08/11/2022: The dorsal foot wound is about the same size. It is a little bit dry with some slough accumulation. The right anterior tibial wound is also unchanged. The right medial leg wound is filling in further with good granulation tissue. Still with some slough buildup on the surface. 08/18/2022: His left leg is bright red, hot, and swollen with cellulitis. The dorsal foot wound actually looks quite good and is superficial with just a little bit of slough accumulation. The right anterior tibial wound is unchanged. The right medial leg wound has filled in with good granulation tissue but has copious amounts of drainage. 08/25/2022: His cellulitis has responded nicely to the oral antibiotics. His dorsal foot wound is smaller but has a fair amount of slough buildup. The right anterior tibial wound remains stable and unchanged. The right medial leg  wound has more granulation tissue under a layer of slough, but continues to have copious drainage. The drainage is actually causing skin irritation and threatens to result in breakdown. 09/01/2022: He continues to have profuse drainage from his wounds which is resulting in tissue maceration on both the dorsum of his left foot and the periwound distal to his medial leg wound. The dorsal foot wound is nearly healed, despite this. The medial right leg wound is filling in with good granulation  tissue. We are [name], [name] ([id number]) [id number] Page 11 of 16 working on getting home health assistance to change his dressings more frequently to try and control the drainage better. 09/08/2022: We have had success in controlling his drainage. The tissue maceration has improved significantly and his swelling has come down quite markedly. The medial right leg wound is much more superficial and has substantially less nonviable tissue present. The anterior tibial wound is a little bit larger, however with some nonviable tissue present. Patient History Information obtained from Patient, Chart. Family History Cancer - Mother, Heart Disease - Father,Mother, Hypertension - Father,Siblings, Lung Disease - Siblings, No family history of Diabetes, Hereditary Spherocytosis, Kidney Disease, Seizures, Stroke, Thyroid Problems, Tuberculosis. Social History Former smoker - quit 1991, Marital Status - Married, Alcohol Use - Moderate, Drug Use - No History, Caffeine Use - Daily - coffee. Medical History Eyes Patient has history of Cataracts - right eye removed Denies history of Glaucoma, Optic Neuritis Ear/Nose/Mouth/Throat Denies history of Chronic sinus problems/congestion, Middle ear problems Hematologic/Lymphatic Patient has history of Anemia - macrocytic, Lymphedema Cardiovascular Patient has history of Congestive Heart Failure, Deep Vein Thrombosis - left leg, Hypertension, Peripheral  Venous Disease Endocrine Denies history of Type I Diabetes, Type II Diabetes Genitourinary Denies history of End Stage Renal Disease Integumentary (Skin) Denies history of History of Burn Oncologic Denies history of Received Chemotherapy, Received Radiation Psychiatric Denies history of Anorexia/bulimia, Confinement Anxiety Hospitalization/Surgery History - cervical fusion. - lumbar surgery. - coronary stent placement. - lasik eye surgery. - hydrocele excision. Medical A Surgical History Notes nd Constitutional Symptoms (General Health) obesity Ear/Nose/Mouth/Throat hard of hearing Respiratory frozen diaphragm right Cardiovascular hyperlipidemia Musculoskeletal myositis, lumbar DDD, spinal stenosis, cervical facet joint syndrome Objective Constitutional Hypertensive, asymptomatic. no acute distress. Vitals Time Taken: 12:30 PM, Height: 71 in, Weight: 267 lbs, BMI: 37.2, Temperature: 98 F, Pulse: 66 bpm, Respiratory Rate: 18 breaths/min, Blood Pressure: 163/79 mmHg. Respiratory Normal work of breathing on room air. General Notes: 09/08/2022: We have had success in controlling his drainage. The tissue maceration has improved significantly and his swelling has come down quite markedly. The medial right leg wound is much more superficial and has substantially less nonviable tissue present. The anterior tibial wound is a little bit larger, however with some nonviable tissue present. Integumentary (Hair, Skin) Wound #1 status is Open. Original cause of wound was Gradually Appeared. The date acquired was: 12/07/2021. The wound has been in treatment 30 [name]. The wound is located on the Left,Dorsal Foot. The wound measures 0.7cm length x 0.7cm width x 0.1cm depth; 0.385cm^2 area and 0.038cm^3 volume. There is Fat Layer (Subcutaneous Tissue) exposed. There is no tunneling or undermining noted. There is a medium amount of serous drainage noted. The wound margin is flat and intact. There  is no granulation within the wound bed. There is a large (67-100%) amount of necrotic tissue within the wound bed including Adherent Slough. The periwound skin appearance exhibited: Hemosiderin Staining. The periwound skin appearance did not exhibit: Excoriation, Dry/Scaly, Maceration, Rubor, Erythema. Periwound temperature was noted as Hot. The periwound has tenderness on palpation. Wound #3 status is Open. Original cause of wound was Gradually Appeared. The date acquired was: 12/07/2021. The wound has been in treatment 30 [name]. The wound is located on the Right,Medial Lower Leg. The wound measures 3.2cm length x 6cm width x 0.6cm depth; 15.08cm^2 area and 9.048cm^3 volume. There is Fat Layer (Subcutaneous Tissue) exposed. There is no tunneling or undermining noted. There is a medium amount of  serosanguineous drainage noted. The wound margin is distinct with the outline attached to the wound base. There is medium (34-66%) red granulation within the wound bed. There is a medium LEAN, VALLIERE ([id number]) [id number] Page 12 of 16 (34-66%) amount of necrotic tissue within the wound bed including Adherent Slough. The periwound skin appearance exhibited: Excoriation, Hemosiderin Staining. The periwound skin appearance did not exhibit: Dry/Scaly, Maceration, Rubor, Erythema. Periwound temperature was noted as No Abnormality. The periwound has tenderness on palpation. Wound #6 status is Open. Original cause of wound was Gradually Appeared. The date acquired was: 05/12/2022. The wound has been in treatment 17 [name]. The wound is located on the Right,Anterior Lower Leg. The wound measures 0.3cm length x 0.3cm width x 0.1cm depth; 0.071cm^2 area and 0.007cm^3 volume. There is Fat Layer (Subcutaneous Tissue) exposed. There is no tunneling or undermining noted. There is a medium amount of serosanguineous drainage noted. The wound margin is epibole. There is large (67-100%) red  granulation within the wound bed. There is a small (1-33%) amount of necrotic tissue within the wound bed including Adherent Slough. The periwound skin appearance had no abnormalities noted for texture. The periwound skin appearance had no abnormalities noted for moisture. The periwound skin appearance exhibited: Hemosiderin Staining. The periwound skin appearance did not exhibit: Atrophie Blanche, Cyanosis, Ecchymosis, Mottled, Pallor, Rubor, Erythema. Periwound temperature was noted as No Abnormality. 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 There was a Selective/Open Wound Non-Viable Tissue Debridement with a total area of 0.49 sq cm performed by [name] Maudlin, MD. With the following instrument(s): Curette to remove Non-Viable tissue/material. Material removed includes [facility] after achieving pain control using Lidocaine 4% Topical Solution. No specimens were taken. A time out was conducted at 13:00, prior to the start of the procedure. A Minimum amount of bleeding was controlled with Pressure. The procedure was tolerated well with a pain level of 0 throughout and a pain level of 0 following the procedure. Post Debridement Measurements: 0.7cm length x 0.7cm width x 0.1cm depth; 0.038cm^3 volume. Character of Wound/Ulcer Post Debridement is improved. Post procedure Diagnosis Wound #1: Same as Pre-Procedure General Notes: Scribed for Dr. Celine [name] by [name] Gouty,  RN. Pre-procedure diagnosis of Wound #1 is a Lymphedema located on the Left,Dorsal Foot . There was a Four Layer Compression Therapy Procedure by [name] Gouty, RN. Post procedure Diagnosis Wound #1: Same as Pre-Procedure Wound #3 Pre-procedure diagnosis of Wound #3 is a Lymphedema located on the Right,Medial Lower Leg . There was a Excisional Skin/Subcutaneous Tissue Debridement with a total area of 19.2 sq cm performed by [name] Maudlin, MD. With the following instrument(s): Curette to remove Viable and Non-Viable tissue/material. Material removed includes Subcutaneous Tissue, Slough, and Other: calcium deposits after achieving pain control using Lidocaine 4% Topical Solution. No specimens were taken. A time out was conducted at 13:00, prior to the start of the procedure. A Minimum amount of bleeding was controlled with Pressure. The procedure was tolerated well with a pain level of 4 throughout and a pain level of 0 following the procedure. Post Debridement Measurements: 3.2cm length x  6cm width x 0.6cm depth; 9.048cm^3 volume. Character of Wound/Ulcer Post Debridement is improved. Post procedure Diagnosis Wound #3: Same as Pre-Procedure General Notes: Scribed for Dr. Celine [name] by [name] Gouty, RN. Pre-procedure diagnosis of Wound #3 is a Lymphedema located on the Right,Medial Lower Leg . There was a Four Layer Compression Therapy Procedure by [name] Gouty, RN. 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 There was a Selective/Open Wound Non-Viable Tissue Debridement with a total area of 0.09 sq cm performed by [name] Maudlin, MD. With the following instrument(s): Curette to remove Non-Viable tissue/material. Material removed includes [facility] after achieving pain control using Lidocaine 4% Topical Solution. No specimens were taken. A time out was conducted at 13:00, prior  to the start of the procedure. A Minimum amount of bleeding was controlled with Pressure. The procedure was tolerated well with a pain level of 0 throughout and a pain level of 0 following the procedure. Post Debridement Measurements: 0.3cm length x 0.3cm width x 0.1cm depth; 0.007cm^3 volume. Character of Wound/Ulcer Post Debridement is improved. Post procedure Diagnosis Wound #6: Same as Pre-Procedure General Notes: Scribed for Dr. Celine [name] by [name] Gouty, RN. Plan Follow-up Appointments: Return Appointment in 1 week. - Dr. Celine [name] Rm 1 Wednesday 2/21 @ 1230 pm Anesthetic: Wound #1 Left,Dorsal Foot: [name] ([id number]) [id number] Page 13 of 16 (In clinic) Topical Lidocaine 4% applied to wound bed Wound #3 Right,Medial Lower Leg: (In clinic) Topical Lidocaine 4% applied to wound bed Wound #6 Right,Anterior Lower Leg: (In clinic) Topical Lidocaine 4% applied to wound bed Bathing/ Shower/ Hygiene: May shower with protection but do not get wound dressing(s) wet. Protect dressing(s) with water repellant cover (for example, large plastic bag) or a cast cover and may then take shower. Edema Control - Lymphedema / SCD / Other: Lymphedema Pumps. Use Lymphedema pumps on leg(s) 2-3 times a day for 45-60 minutes. If wearing any wraps or hose, do not remove them. Continue exercising as instructed. - use 1-2 times per day over wraps Elevate legs to the level of the heart or above for 30 minutes daily and/or when sitting for 3-4 times a day throughout the day. - throughout the day Avoid standing for long periods of time. Exercise regularly Home Health: New wound care orders this week; continue Home Health for wound care. May utilize formulary equivalent dressing for wound treatment orders unless otherwise specified. Dressing changes to be completed by [place] on Monday / Wednesday / Friday except when patient has scheduled visit at [facility]. - pt seen  in clinic on Wednesday Other [place] Orders/Instructions: - [facility] WOUND #1: - Foot Wound Laterality: Dorsal, Left Peri-Wound Care: Zinc Oxide Ointment 30g tube 3 x Per Week/30 Days Discharge Instructions: Apply Zinc Oxide to periwound with each dressing change as needed for excoriation Peri-Wound Care: Sween Lotion (Moisturizing lotion) 3 x Per Week/30 Days Discharge Instructions: Apply moisturizing lotion as directed Prim Dressing: Sorbalgon AG Dressing, 4x4 (in/in) (Generic) 3 x Per Week/30 Days ary Discharge Instructions: or equivalent silver alginate. Apply to wound bed as instructed Secondary Dressing: ABD Pad, 5x9 (Generic) 3 x Per Week/30 Days Discharge Instructions: Apply over primary dressing as directed. Secondary Dressing: Woven Gauze Sponge, Non-Sterile 4x4 in (Generic) 3 x Per Week/30 Days Discharge Instructions: Apply over primary dressing as directed. Com pression Wrap: FourPress (4 layer compression wrap) (Generic) 3 x Per Week/30 Days Discharge Instructions: Apply four  layer compression as directed. May also use Urgo K2 compression system as alternative. WOUND #3: - Lower Leg Wound Laterality: Right, Medial Peri-Wound Care: Triamcinolone 15 (g) 1 x Per Week/30 Days Discharge Instructions: Use triamcinolone 15 (g) as directed Peri-Wound Care: Zinc Oxide Ointment 30g tube 1 x Per Week/30 Days Discharge Instructions: Apply Zinc Oxide to excoriated periwound with each dressing change as needed Peri-Wound Care: Sween Lotion (Moisturizing lotion) 1 x Per Week/30 Days Discharge Instructions: Apply moisturizing lotion as directed Prim Dressing: Santyl Ointment 1 x Per Week/30 Days ary Discharge Instructions: to wound bed mixed 1:1 with sodium thiosulfate ointment Prim Dressing: sodium thiosulfate ointment 1 x Per Week/30 Days ary Discharge Instructions: 1:1 mix with santyl to wound bed Secondary Dressing: ABD Pad, 8x10 (Generic) 1 x Per Week/30 Days Discharge  Instructions: Apply over primary dressing as directed. Secondary Dressing: Woven Gauze Sponge, Non-Sterile 4x4 in (Generic) 1 x Per Week/30 Days Discharge Instructions: Apply over primary dressing as directed. Com pression Wrap: FourPress (4 layer compression wrap) (Generic) 1 x Per Week/30 Days Discharge Instructions: Apply four layer compression as directed. May also use Urgo K2 compression system as alternative. WOUND #6: - Lower Leg Wound Laterality: Right, Anterior Peri-Wound Care: Triamcinolone 15 (g) 3 x Per Week/30 Days Discharge Instructions: Use triamcinolone 15 (g) ( in clinic) Peri-Wound Care: Zinc Oxide Ointment 30g tube 3 x Per Week/30 Days Discharge Instructions: Apply Zinc Oxide to periwound with each dressing change as needed for excoriation Peri-Wound Care: Sween Lotion (Moisturizing lotion) 3 x Per Week/30 Days Discharge Instructions: Apply moisturizing lotion as directed Prim Dressing: sodium thiosulfate ointment 3 x Per Week/30 Days ary Discharge Instructions: to wound bed with each dressing change Secondary Dressing: ABD Pad, 5x9 (Generic) 3 x Per Week/30 Days Discharge Instructions: Apply over primary dressing as directed. Secondary Dressing: Woven Gauze Sponge, Non-Sterile 4x4 in (Generic) 3 x Per Week/30 Days Discharge Instructions: Apply over primary dressing as directed. Com pression Wrap: FourPress (4 layer compression wrap) (Generic) 3 x Per Week/30 Days Discharge Instructions: Apply four layer compression as directed. May also use Urgo K2 compression system as alternative. 09/08/2022: We have had success in controlling his drainage. The tissue maceration has improved significantly and his swelling has come down quite markedly. The medial right leg wound is much more superficial and has substantially less nonviable tissue present. The anterior tibial wound is a little bit larger, however with some nonviable tissue present. I used a curette to debride slough from  the dorsal foot wound and slough and nonviable subcutaneous tissue, along with some chunks of calcium deposits from the anterior tibial wound and right medial leg wound. We will continue topical sodium thiosulfate to both of these wounds and Santyl to the medial leg wound in addition to the sodium thiosulfate. Continue silver alginate to the dorsal foot wound. Continue bilateral 4-layer compression. Follow-up in 1 week. Electronic Signature(s) Signed: 09/08/2022 1:26:28 PM By: [name] Maudlin MD FACS Entered By: [name] on 09/08/2022 13:26:27 [name] ([id number] Page 14 of 16 -------------------------------------------------------------------------------- HxROS Details Patient Name: Date of Service: KYRILLOS, KALAFUT 09/08/2022 12:30 PM Medical Record Number: [id number] Patient Account Number: [id number] Date of Birth/Sex: Treating RN: 01/25/1951 (72 y.o. M) Primary Care Provider: PA [name], NO Other Clinician: Referring Provider: Treating Provider/Extender: [name] in Treatment: 30 Information Obtained From Patient Chart Constitutional Symptoms (General Health) Medical History: Past Medical History Notes: obesity Eyes Medical History: Positive for: Cataracts - right eye removed Negative for: Glaucoma; Optic  Neuritis Ear/Nose/Mouth/Throat Medical History: Negative for: Chronic sinus problems/congestion; Middle ear problems Past Medical History Notes: hard of hearing Hematologic/Lymphatic Medical History: Positive for: Anemia - macrocytic; Lymphedema Respiratory Medical History: Past Medical History Notes: frozen diaphragm right Cardiovascular Medical History: Positive for: Congestive Heart Failure; Deep Vein Thrombosis - left leg; Hypertension; Peripheral Venous Disease Past Medical History Notes: hyperlipidemia Endocrine Medical History: Negative for: Type I Diabetes; Type II  Diabetes Genitourinary Medical History: Negative for: End Stage Renal Disease Integumentary (Skin) Medical History: Negative for: History of Burn Musculoskeletal Medical History: Past Medical History Notes: myositis, lumbar DDD, spinal stenosis, cervical facet joint syndrome Oncologic Medical HistoryJYQUEZ, VENEGAS ([id number]) [id number] Page 15 of 16 Negative for: Received Chemotherapy; Received Radiation Psychiatric Medical History: Negative for: Anorexia/bulimia; Confinement Anxiety HBO Extended History Items Eyes: Cataracts Immunizations Pneumococcal Vaccine: Received Pneumococcal Vaccination: Yes Received Pneumococcal Vaccination On or After 60th Birthday: Yes Implantable Devices No devices added Hospitalization / Surgery History Type of Hospitalization/Surgery cervical fusion lumbar surgery coronary stent placement lasik eye surgery hydrocele excision Family and Social History Cancer: Yes - Mother; Diabetes: No; Heart Disease: Yes - Father,Mother; Hereditary Spherocytosis: No; Hypertension: Yes - Father,Siblings; Kidney Disease: No; Lung Disease: Yes - Siblings; Seizures: No; Stroke: No; Thyroid Problems: No; Tuberculosis: No; Former smoker - quit 1991; Marital Status - Married; Alcohol Use: Moderate; Drug Use: No History; Caffeine Use: Daily - coffee; Financial Concerns: No; Food, Clothing or Shelter Needs: No; Support System Lacking: No; Transportation Concerns: No Electronic Signature(s) Signed: 09/08/2022 1:52:57 PM By: [name] Maudlin MD FACS Entered By: [name] on 09/08/2022 13:24:57 -------------------------------------------------------------------------------- SuperBill Details Patient Name: Date of Service: LOGIC, HOOCK 09/08/2022 Medical Record Number: [id number] Patient Account Number: [id number] Date of Birth/Sex: Treating RN: 1951/01/29 (72 y.o. M) Primary Care Provider: PA [name], NO Other Clinician: Referring  Provider: Treating Provider/Extender: [name] in Treatment: 30 Diagnosis Coding ICD-10 Codes Code Description [phone] Non-pressure chronic ulcer of other part of right lower leg with fat layer exposed L97.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) I10 Essential (primary) hypertension I25.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 I89.0 Lymphedema, not elsewhere classified Z86.718 Personal history of other venous thrombosis and embolism Facility Procedures : SHYKEEM, MALNAR Code: [id number] RT ([id number]) IC L L Description: 11042 - DEB SUBQ TISSUE 20 SQ CM/< [id number] D-10 Diagnosis Description 97.812 Non-pressure chronic ulcer of other part of right lower leg with fat layer exposed 94.2 Calcinosis cutis Modifier: [id number]. Quantity: 1 pdf Page 16 of 16 : 12 CPT4 Code: [id number] IC L Description: 53 - DEBRIDE WOUND 1ST 20 SQ CM OR < D-10 Diagnosis Description 97.522 Non-pressure chronic ulcer of other part of left foot with fat layer exposed Modifier: 1 Quantity: Physician Procedures : CPT4 Code Description Modifier [id number] - WC PHYS LEVEL 4 - EST PT 25 ICD-10 Diagnosis Description 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ed L97.522 Non-pressure chronic ulcer of other part of left  foot with fat layer exposed 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.0 Lymphedema, not elsewhere classified Quantity: 1 : [id number] 11042 - WC PHYS SUBQ TISS 20 SQ CM ICD-10 Diagnosis Description 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 Quantity: 1 : [id number] - WC PHYS DEBR WO ANESTH 20 SQ CM ICD-10 Diagnosis Description L97.522 Non-pressure chronic ulcer  of other part of left foot with fat layer  exposed Quantity: 1 Electronic Signature(s) Signed: 09/08/2022 1:26:55 PM By: [name] Maudlin MD FACS Entered By: [name] on 09/08/2022 13:26:55

## 2022-09-10 DIAGNOSIS — L942 Calcinosis cutis: Secondary | ICD-10-CM | POA: Diagnosis not present

## 2022-09-10 DIAGNOSIS — L97522 Non-pressure chronic ulcer of other part of left foot with fat layer exposed: Secondary | ICD-10-CM | POA: Diagnosis not present

## 2022-09-10 DIAGNOSIS — L97812 Non-pressure chronic ulcer of other part of right lower leg with fat layer exposed: Secondary | ICD-10-CM | POA: Diagnosis not present

## 2022-09-10 DIAGNOSIS — L03116 Cellulitis of left lower limb: Secondary | ICD-10-CM | POA: Diagnosis not present

## 2022-09-10 DIAGNOSIS — I872 Venous insufficiency (chronic) (peripheral): Secondary | ICD-10-CM | POA: Diagnosis not present

## 2022-09-10 DIAGNOSIS — I10 Essential (primary) hypertension: Secondary | ICD-10-CM | POA: Diagnosis not present

## 2022-09-13 DIAGNOSIS — L03116 Cellulitis of left lower limb: Secondary | ICD-10-CM | POA: Diagnosis not present

## 2022-09-13 DIAGNOSIS — L97522 Non-pressure chronic ulcer of other part of left foot with fat layer exposed: Secondary | ICD-10-CM | POA: Diagnosis not present

## 2022-09-13 DIAGNOSIS — I872 Venous insufficiency (chronic) (peripheral): Secondary | ICD-10-CM | POA: Diagnosis not present

## 2022-09-13 DIAGNOSIS — I10 Essential (primary) hypertension: Secondary | ICD-10-CM | POA: Diagnosis not present

## 2022-09-13 DIAGNOSIS — L942 Calcinosis cutis: Secondary | ICD-10-CM | POA: Diagnosis not present

## 2022-09-13 DIAGNOSIS — L97812 Non-pressure chronic ulcer of other part of right lower leg with fat layer exposed: Secondary | ICD-10-CM | POA: Diagnosis not present

## 2022-09-15 ENCOUNTER — Encounter (HOSPITAL_BASED_OUTPATIENT_CLINIC_OR_DEPARTMENT_OTHER): Payer: Medicare Other | Admitting: General Surgery

## 2022-09-15 DIAGNOSIS — S81801A Unspecified open wound, right lower leg, initial encounter: Secondary | ICD-10-CM | POA: Diagnosis not present

## 2022-09-15 DIAGNOSIS — I739 Peripheral vascular disease, unspecified: Secondary | ICD-10-CM | POA: Diagnosis not present

## 2022-09-15 DIAGNOSIS — L97522 Non-pressure chronic ulcer of other part of left foot with fat layer exposed: Secondary | ICD-10-CM | POA: Diagnosis not present

## 2022-09-15 DIAGNOSIS — I872 Venous insufficiency (chronic) (peripheral): Secondary | ICD-10-CM | POA: Diagnosis not present

## 2022-09-15 DIAGNOSIS — L97812 Non-pressure chronic ulcer of other part of right lower leg with fat layer exposed: Secondary | ICD-10-CM | POA: Diagnosis not present

## 2022-09-15 DIAGNOSIS — I890 Lymphedema, not elsewhere classified: Secondary | ICD-10-CM | POA: Diagnosis not present

## 2022-09-15 DIAGNOSIS — I110 Hypertensive heart disease with heart failure: Secondary | ICD-10-CM | POA: Diagnosis not present

## 2022-09-15 DIAGNOSIS — L942 Calcinosis cutis: Secondary | ICD-10-CM | POA: Diagnosis not present

## 2022-09-16 NOTE — Progress Notes (Signed)
[name] ([id number]) [id number] Page 1 of 15 Visit Report for 09/15/2022 Chief Complaint Document Details Patient Name: Date of Service: [name], [name] 09/15/2022 12:30 PM Medical Record Number: [id number] Patient Account Number: [id number] Date of Birth/Sex: Treating RN: September 17, 1950 (72 y.o. M) Primary Care Provider: PA [name], NO Other Clinician: Referring Provider: Treating Provider/Extender: [name] in Treatment: 31 Information Obtained from: Patient Chief Complaint Patient seen for complaints of Non-Healing Wounds. Electronic Signature(s) Signed: 09/15/2022 1:18:06 PM By: [name] Maudlin MD FACS Entered By: [name] on 09/15/2022 13:18:06 -------------------------------------------------------------------------------- Debridement Details Patient Name: Date of Service: [name], [name] 09/15/2022 12:30 PM Medical Record Number: [id number] Patient Account Number: [id number] Date of Birth/Sex: Treating RN: 04-15-1951 (72 y.o. [name] Primary Care Provider: PA [name], [place] Other Clinician: Referring Provider: Treating Provider/Extender: [name] in Treatment: 31 Debridement Performed for Assessment: Wound #6 Right,Anterior Lower Leg Performed By: Physician [name] Maudlin, MD Debridement Type: Debridement Level of Consciousness (Pre-procedure): Awake and Alert Pre-procedure Verification/Time Out Yes - 13:05 Taken: Start Time: 13:07 Pain Control: Lidocaine 4% T opical Solution T Area Debrided (L x W): otal 0.8 (cm) x 0.6 (cm) = 0.48 (cm) Tissue and other material debrided: Viable, Non-Viable, Slough, Subcutaneous, Slough Level: Skin/Subcutaneous Tissue Debridement Description: Excisional Instrument: Curette Bleeding: Minimum Hemostasis Achieved: Pressure Procedural Pain: 3 Post Procedural Pain: 1 Response to Treatment: Procedure was tolerated well Level of Consciousness  (Post- Awake and Alert procedure): Post Debridement Measurements of Total Wound Length: (cm) 0.8 Width: (cm) 0.6 Depth: (cm) 0.2 Volume: (cm) 0.075 Character of Wound/Ulcer Post Debridement: Improved Post Procedure Diagnosis Same as [name] ([id number] Page 2 of 15 Notes Scribed for Dr. Celine [name] by [name] Gouty, RN Electronic Signature(s) Signed: 09/15/2022 2:07:52 PM By: [name] Maudlin MD FACS Signed: 09/15/2022 5:50:47 PM By: [name] Gouty RN, BSN Entered By: [name] on 09/15/2022 13:10:41 -------------------------------------------------------------------------------- Debridement Details Patient Name: Date of Service: [name], [name] 09/15/2022 12:30 PM Medical Record Number: [id number] Patient Account Number: [id number] Date of Birth/Sex: Treating RN: 10-Dec-1950 (72 y.o. [name] Primary Care Provider: PA [name], [place] Other Clinician: Referring Provider: Treating Provider/Extender: [name] in Treatment: 31 Debridement Performed for Assessment: Wound #3 Right,Medial Lower Leg Performed By: Physician [name] Maudlin, MD Debridement Type: Debridement Level of Consciousness (Pre-procedure): Awake and Alert Pre-procedure Verification/Time Out Yes - 13:05 Taken: Start Time: 13:07 Pain Control: Lidocaine 4% T opical Solution T Area Debrided (L x W): otal 4 (cm) x 5.5 (cm) = 22 (cm) Tissue and other material debrided: Viable, Non-Viable, Slough, Subcutaneous, Slough Level: Skin/Subcutaneous Tissue Debridement Description: Excisional Instrument: Curette Bleeding: Minimum Hemostasis Achieved: Pressure Procedural Pain: 3 Post Procedural Pain: 2 Response to Treatment: Procedure was tolerated well Level of Consciousness (Post- Awake and Alert procedure): Post Debridement Measurements of Total Wound Length: (cm) 4 Width: (cm) 5.5 Depth: (cm) 0.6 Volume: (cm)  10.367 Character of Wound/Ulcer Post Debridement: Improved Post Procedure Diagnosis Same as Pre-procedure Notes Scribed for Dr. Celine [name] by [name] Gouty, RN Electronic Signature(s) Signed: 09/15/2022 2:07:52 PM By: [name] Maudlin MD FACS Signed: 09/15/2022 5:50:47 PM By: [name] Gouty RN, BSN Entered By: [name] on 09/15/2022 13:11:36 HPI Details -------------------------------------------------------------------------------- [name] ([id number] Page 3 of 15 Patient Name: Date of Service: [name], [name] 09/15/2022 12:30 PM Medical Record Number: [id number] Patient Account Number: [id number] Date of Birth/Sex: Treating RN: 1950/11/07 (72 y.o. M) Primary Care Provider: PA [name], NO Other Clinician: Referring Provider: Treating Provider/Extender: [name]  [name] Weeks in Treatment: 31 History of Present Illness HPI Description: ADMISSION 02/09/2022 This is a 72 year old man who recently moved to [place] from [place]. He has a history of of coronary artery disease and prior left popliteal vein DVT . He is not diabetic and does not smoke. 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 He has had multiple spinal surgeries and has limited mobility. He primarily uses a motorized wheelchair. He has had lymphedema for many years and does have lymphedema pumps although he says he does not use them frequently. He has wounds on his bilateral lower extremities. He was receiving care in [place] for these prior to his move. They were apparently packing his wounds with Betadine soaked gauze. He has worn compression wraps in the past but not recently. He did say that they helped tremendously. In clinic today, his right ABI is noncompressible but has good signals; the left ABI was 1.17. No formal vascular studies have been performed. On his left dorsal foot, there is a circular lesion that exposes the fat layer. There is  fairly heavy slough accumulation within the wound, but no significant odor. On his right medial calf, he has multiple pinholes that have slough buildup in them and probe for about 2 to 4 mm in depth. They are draining clear fluid. On his right lateral midfoot, he has ulceration consistent with venous stasis. It is geographic and has slough accumulation. 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 02/17/2022: All of the wounds are little bit larger today and have more slough accumulation. Today, the medial calf openings are larger and probe to something that feels calcified. There is odor coming from his wounds. His vascular studies are scheduled for August 9 and 10. 02-24-2022 upon evaluation today patient presents for follow-up concerning his ongoing issues here with his lower extremities. With that being said he does have significant problems with what appears to be cellulitis unfortunately the PCR culture that was sent last week got crushed by UPS in transit. With that being said we are going to reobtain a culture today although I am actually to do it on the right medial leg as this seems to be the most inflamed area today where there is some questionable drainage as well. I am going to remove some of the calcium deposits which are loosening obtain a deeper culture from this area. Fortunately I do not see any evidence of active infection systemically which is good news but locally I think we are having a bigger issue here. He does have his appointment next Thursday with vascular. Patient has also been referred to [facility] for further evaluation and treatment of the calcinosis for possible sulfate injections. 03/04/2022: The culture that was performed last week grew out multiple species including Klebsiella, Morganella, and Pseudomonas aeruginosa. He had been prescribed Bactrim but this was not adequate to cover  all of the species and levofloxacin was recommended. He completed the Bactrim but did not pick up the levofloxacin and begin taking it until yesterday. 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 He had arterial studies performed today. He does have evidence of peripheral arterial disease. Results copied here: +-------+-----------+-----------+------------+------------+ ABI/TBIT oday's ABIT oday's TBIPrevious ABIPrevious TBI +-------+-----------+-----------+------------+------------+ Right [place] 0.75    +-------+-----------+-----------+------------+------------+ Left  [place] 0.57    +-------+-----------+-----------+------------+------------+ Arterial wall calcification precludes accurate ankle pressures and ABIs. Summary: Right: Resting right ankle-brachial index indicates noncompressible right lower extremity arteries. The right toe-brachial index is normal. Left: Resting left ankle-brachial index indicates noncompressible left lower extremity arteries. The left toe-brachial index is abnormal. He continues to have further tissue breakdown at the right medial calf and there is ample slough accumulation along with necrotic subcutaneous tissue. The left dorsal foot wound has built up slough, as well. The right medial foot wound is nearly closed with just a light layer of eschar over the surface. The right dorsal lateral foot wound has also accumulated a fair amount of slough and remains quite tender. 03/10/2022: He has been taking the prescribed levofloxacin and has about 3 days left of this. The erythema and induration on his right leg has improved. He continues to experience further breakdown of the right medial calf wound. There is extensive slough and debris accumulation there. There is heavy slough on his left dorsal  foot wound, but no odor or purulent drainage. The right medial foot wound appears to be closed. The right dorsal lateral foot wound also has a fair amount of slough but it is less tender than at our last visit. He still does not have an appointment with dermatology. 03/22/2022: The right anterior tibial wound has closed. The right lateral foot wound is nearly closed with just a little bit of slough. The left dorsal foot wound is smaller and continues to have some slough buildup but less so than at prior visits. There is good granulation tissue underlying the slough. Unfortunately the right medial calf wound continues to deteriorate. It has a foul odor today and a lot of necrotic tissue, the surrounding skin is also erythematous and moist. 03/30/2022: The right lateral foot wound continues to contract and just has a little bit of slough and eschar present. The left dorsal foot wound is also smaller with slough overlying good granulation tissue. The right medial calf wound actually looks quite a bit better today; there is no odor and there is much less necrotic tissue although some remains present. We have been using topical gentamicin and he has been taking oral Augmentin. 04/07/2022: The patient saw dermatology at [facility] last week. Unfortunately, it appears that they did not read any of the documentation that was provided. 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 Overall, it was a highly unsatisfactory consultation. In the meantime, however, we were able to identify compounding pharmacy who could create a topical sodium thiosulfate ointment. The patient has that with him today. Both of the right foot wounds are now closed. The left  dorsal foot wound has contracted but still has a layer of slough on the surface. The medial calf wounds on the right all look a little bit better. They still have heavy slough along with the chunks of calcium. Everything is stained purple. 04/14/2022: The wound on his dorsal left foot is a little bit smaller again today. There is still slough accumulation on the surface. The medial calf wounds have quite a bit less slough accumulation today. His right leg, however, is warm and erythematous, concerning for cellulitis. JEREMEY, DIGUGLIELMO ([id number]) [id number] Page 4 of 15 04/14/2022: He completed his course  of Augmentin yesterday. The medial calf wound sites are much cleaner today and shallower. There is less fragmented calcium in the wounds. He has a lot of lymphedema fluid-like drainage from these wounds, but no purulent discharge or malodor. The wound on his dorsal left foot has also contracted and is shallower. There is a layer of slough over healthy granulation tissue. 05/05/2022: The left dorsal foot wound is smaller today with less slough and more granulation tissue. The right medial calf wounds are shallower, but have extensive slough and some fat necrosis. The drainage has diminished considerably, however. 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 05/12/2022: The left dorsal foot wound continues to contract and fill with granulation tissue. There is slough on the wound surface. The right medial calf wounds also continue to fill with healthy tissue, but there is remaining slough and fat necrosis present. He had a significant increase in his drainage this week and it was a bright yellow-green color. He also had a new wound open over his right anterior tibial surface associated with the spike of cutaneous calcium. The leg is more red, as well, concerning for  infection. 05/19/2022: His culture returned positive for Pseudomonas. He is currently taking a course of oral levofloxacin. We have also been using topical gentamicin mixed in with his sodium thiosulfate. All of the wounds look better this week. 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 There is slough and nonviable subcutaneous tissue still present. The new anterior tibial wound just has a light layer of slough. The dorsal foot wound also has some slough present and appears a little dry today. 05/26/2022: The right medial leg wound continues to improve. It is feeling with granulation tissue, but still accumulates a fairly thick layer of slough on the surface. The more recent anterior tibial wound has remained quite small, but also has some slough on the surface. The left dorsal foot wound has contracted somewhat and has perimeter epithelialization. He completed his oral levofloxacin. 06/02/2022: The left dorsal foot wound continues to improve significantly. There is just a little slough on the wound surface. The right medial leg wound had black drainage, but it looks like silver alginate was applied last week and I theorized that silver sulfide was created with a combination of the silver alginate and the sodium thiosulfate. It did, however, have a bit of an odor to it and extensive slough accumulation. The small anterior tibial wound also had a bit of slough and nonviable subcutaneous tissue present. The skin of his leg was rather macerated, as well. 06/09/2022: The left dorsal foot wound is smaller again this week with just a light layer of slough on the surface. The right medial leg wound looks substantially better. The leg and periwound skin are no longer red and macerated. There is good granulation tissue forming with less slough and nonviable tissue. The anterior tibial wound just has a small amount of slough accumulation. 06/16/2022: The left dorsal foot wound  continues to contract. His wrap slipped a little bit, however, and his foot is more swollen. The right medial leg wound continues to improve. The underlying tissue is more vital-appearing and there is less slough. He does have substantial drainage. The small anterior tibial wound has a bit of slough accumulation. 06/23/2022: The left dorsal foot wound is about half the size as it was last week. There is  just some light slough on the surface and some eschar buildup around the edges. The right anterior leg wound is small with a little slough on the surface. Unfortunately, the right medial leg wound deteriorated fairly substantially. It is malodorous with gray/green/black discoloration. 06/30/2022: The left dorsal foot wound continues to contract. It is nearly closed with just a light layer of slough present. The right anterior leg wound is about the same size and has a small amount of slough on the surface. The right medial leg wound looks a lot better than it did last week. The odor has abated and the tissue is less pulpy and necrotic-looking. There is still a fairly thick layer of slough present. 12/13; the left dorsal foot wound looks very healthy. There was no need to change the dressing here and no debridement. He has a small area on the right anterior lower leg apparently had not changed much in size. The area on the medial leg is the most problematic area this is large with some depth and a completely nonviable surface today. 07/14/2022: The left dorsal foot wound is smaller today with just a little slough and eschar present. The right anterior lower leg wound is about the same without any significant change. The medial leg wound has a black and green surface and is malodorous. No purulent drainage and the periwound skin is intact. Edema control is suboptimal. 07/21/2022: The dorsal foot wound continues to contract and is nearly closed with just a little bit of slough and eschar on the surface.  The right anterior lower leg wound is shallower today but otherwise the dimensions are unchanged. The medial leg wound looks significantly better although it still has a nonviable surface; the odor and black/green surface has resolved. His culture came back with multiple species sensitive to Augmentin, which was prescribed and a very low level of Pseudomonas, which should be handled by the topical gentamicin we have been using. 07/28/2022: The dorsal foot wound is down to just a very tiny opening with a little eschar on the surface. The right anterior tibial wound is about the same with some slough buildup. The right medial leg wound looks quite a bit better this week. There is thick rubbery slough on the surface, but the odor and drainage has improved significantly. 08/04/2022: The dorsal foot wound is almost healed, but the periwound skin has broken down. This appears to be secondary to moisture and adhesive from the absorbent pad. The right anterior tibial wound is shallower. The right medial leg wound continues to improve. It is smaller, still with rubbery slough on the surface. No malodor or purulent drainage. 08/11/2022: The dorsal foot wound is about the same size. It is a little bit dry with some slough accumulation. The right anterior tibial wound is also unchanged. The right medial leg wound is filling in further with good granulation tissue. Still with some slough buildup on the surface. 08/18/2022: His left leg is bright red, hot, and swollen with cellulitis. The dorsal foot wound actually looks quite good and is superficial with just a little bit of slough accumulation. The right anterior tibial wound is unchanged. The right medial leg wound has filled in with good granulation tissue but has copious amounts of drainage. 08/25/2022: His cellulitis has responded nicely to the oral antibiotics. His dorsal foot wound is smaller but has a fair amount of slough buildup. The right anterior tibial  wound remains stable and unchanged. The right medial leg wound has more granulation tissue under  a layer of slough, but continues to have copious drainage. The drainage is actually causing skin irritation and threatens to result in breakdown. 09/01/2022: He continues to have profuse drainage from his wounds which is resulting in tissue maceration on both the dorsum of his left foot and the periwound distal to his medial leg wound. The dorsal foot wound is nearly healed, despite this. The medial right leg wound is filling in with good granulation tissue. We are working on getting home health assistance to change his dressings more frequently to try and control the drainage better. 09/08/2022: We have had success in controlling his drainage. The tissue maceration has improved significantly and his swelling has come down quite markedly. The medial right leg wound is much more superficial and has substantially less nonviable tissue present. The anterior tibial wound is a little bit larger, however with some nonviable tissue present. 09/15/2022: The dorsal foot wound is nearly closed. The anterior tibial wound measures slightly larger today. The medial right leg wound continues to improve quite dramatically with less nonviable tissue and more robust-looking granulation. Edema control is significantly better. Electronic Signature(s) Signed: 09/15/2022 1:19:05 PM By: [name] Maudlin MD FACS Entered By: [name] on 09/15/2022 13:19:05 [name] ([id number] Page 5 of 15 -------------------------------------------------------------------------------- Physical Exam Details Patient Name: Date of Service: [name], [name] 09/15/2022 12:30 PM Medical Record Number: [id number] Patient Account Number: [id number] Date of Birth/Sex: Treating RN: [birth year] (72 y.o. M) Primary Care Provider: PA [name], NO Other Clinician: Referring Provider: Treating Provider/Extender:  [name], [name] in Treatment: 31 Constitutional . . . . no acute distress. Respiratory Normal work of breathing on room air. Notes 09/15/2022: The dorsal foot wound is nearly closed. The anterior tibial wound measures slightly larger today. The medial right leg wound continues to improve quite dramatically with less nonviable tissue and more robust-looking granulation. Edema control is significantly better. Electronic Signature(s) Signed: 09/15/2022 1:20:29 PM By: [name] Maudlin MD FACS Entered By: [name] on 09/15/2022 13:20:29 -------------------------------------------------------------------------------- Physician Orders Details Patient Name: Date of Service: [name], [name] 09/15/2022 12:30 PM Medical Record Number: [id number] Patient Account Number: [id number] Date of Birth/Sex: Treating RN: 11-07-1950 (72 y.o. [name] Primary Care Provider: PA [name], [place] Other Clinician: Referring Provider: Treating Provider/Extender: [name] in Treatment: 14 Verbal / Phone Orders: No Diagnosis Coding ICD-10 Coding Code Description [phone] Non-pressure chronic ulcer of other part of right lower leg with fat layer exposed L97.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) I10 Essential (primary) hypertension I25.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 - Dr. Celine [name] Rm 1 Return A Wednesday 2/28 @ 1230 pm Anesthetic Wound #1 Left,Dorsal Foot (In clinic) Topical Lidocaine 4% applied to wound bed Wound #3 Right,Medial Lower Leg (In clinic) Topical Lidocaine 4% applied to wound bed Wound #6 Right,Anterior Lower Leg [name], [name]  ([id number] Page 6 of 15 (In clinic) Topical Lidocaine 4% applied to wound bed Bathing/ Shower/ Hygiene May shower with protection but do not get wound dressing(s) wet. Protect dressing(s) with water repellant cover (for example, large plastic bag) or a cast cover and may then take shower. Edema Control - Lymphedema / SCD / Other Lymphedema Pumps. Use Lymphedema pumps on leg(s) 2-3 times a day for 45-60 minutes.  If wearing any wraps or hose, do not remove them. Continue exercising as instructed. - use 1-2 times per day over wraps Elevate legs to the level of the heart or above for 30 minutes daily and/or when sitting for 3-4 times a day throughout the day. - throughout the day Avoid standing for long periods of time. Exercise regularly Home Health No change in wound care orders this week; continue Home Health for wound care. May utilize formulary equivalent dressing for wound treatment orders unless otherwise specified. Dressing changes to be completed by [facility] on Monday / Wednesday / Friday except when patient has scheduled visit at [facility]. - pt seen in clinic on Wednesday Other [place] Orders/Instructions: - [facility] Wound Treatment Wound #1 - Foot Wound Laterality: Dorsal, Left Peri-Wound Care: Zinc Oxide Ointment 30g tube 3 x Per Week/30 Days Discharge Instructions: Apply Zinc Oxide to periwound with each dressing change as needed for excoriation Peri-Wound Care: Sween Lotion (Moisturizing lotion) 3 x Per Week/30 Days Discharge Instructions: Apply moisturizing lotion as directed Prim Dressing: Sorbalgon AG Dressing, 4x4 (in/in) (Generic) 3 x Per Week/30 Days ary Discharge Instructions: or equivalent silver alginate. Apply to wound bed as instructed Secondary Dressing: ABD Pad, 5x9 (Generic) 3 x Per Week/30 Days Discharge Instructions: Apply over primary dressing as directed. Secondary Dressing: Woven Gauze Sponge,  Non-Sterile 4x4 in (Generic) 3 x Per Week/30 Days Discharge Instructions: Apply over primary dressing as directed. Compression Wrap: FourPress (4 layer compression wrap) (Generic) 3 x Per Week/30 Days Discharge Instructions: Apply four layer compression as directed. May also use Urgo K2 compression system as alternative. Wound #3 - Lower Leg Wound Laterality: Right, Medial Peri-Wound Care: Triamcinolone 15 (g) 1 x Per Week/30 Days Discharge Instructions: Use triamcinolone 15 (g) as directed Peri-Wound Care: Zinc Oxide Ointment 30g tube 1 x Per Week/30 Days Discharge Instructions: Apply Zinc Oxide to excoriated periwound with each dressing change as needed Peri-Wound Care: Sween Lotion (Moisturizing lotion) 1 x Per Week/30 Days Discharge Instructions: Apply moisturizing lotion as directed Prim Dressing: Santyl Ointment 1 x Per Week/30 Days ary Discharge Instructions: to wound bed mixed 1:1 with sodium thiosulfate ointment Prim Dressing: sodium thiosulfate ointment 1 x Per Week/30 Days ary Discharge Instructions: 1:1 mix with santyl to wound bed Secondary Dressing: ABD Pad, 8x10 (Generic) 1 x Per Week/30 Days Discharge Instructions: Apply over primary dressing as directed. Secondary Dressing: Woven Gauze Sponge, Non-Sterile 4x4 in (Generic) 1 x Per Week/30 Days Discharge Instructions: Apply over primary dressing as directed. Compression Wrap: FourPress (4 layer compression wrap) (Generic) 1 x Per Week/30 Days Discharge Instructions: Apply four layer compression as directed. May also use Urgo K2 compression system as alternative. Wound #6 - Lower Leg Wound Laterality: Right, Anterior Peri-Wound Care: Triamcinolone 15 (g) 3 x Per Week/30 Days Discharge Instructions: Use triamcinolone 15 (g) ( in clinic) Peri-Wound Care: Zinc Oxide Ointment 30g tube 3 x Per Week/30 Days Discharge Instructions: Apply Zinc Oxide to periwound with each dressing change as needed for excoriation Peri-Wound Care:  Sween Lotion (Moisturizing lotion) 3 x Per Week/30 Days Discharge Instructions: Apply moisturizing lotion as directed Prim Dressing: sodium thiosulfate ointment 3 x Per Week/30 Days ary Discharge Instructions: to wound bed with each dressing change RAYEN, SIRI ([id number].pdf Page 7 of 15 Secondary Dressing: ABD Pad, 5x9 (Generic) 3 x Per Week/30 Days Discharge Instructions: Apply over primary dressing as directed. Secondary Dressing: Woven Gauze Sponge, Non-Sterile 4x4 in (Generic) 3 x Per Week/30 Days Discharge Instructions: Apply over  primary dressing as directed. Compression Wrap: FourPress (4 layer compression wrap) (Generic) 3 x Per Week/30 Days Discharge Instructions: Apply four layer compression as directed. May also use Urgo K2 compression system as alternative. Electronic Signature(s) Signed: 09/15/2022 2:07:52 PM By: [name] Maudlin MD FACS Entered By: [name] on 09/15/2022 13:20:46 -------------------------------------------------------------------------------- Problem List Details Patient Name: Date of Service: AYAANSH, JURAS 09/15/2022 12:30 PM Medical Record Number: [id number] Patient Account Number: [id number] Date of Birth/Sex: Treating RN: [birth year] (72 y.o. 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) 02/09/2022 No Yes I10 Essential (primary) hypertension 02/09/2022 No Yes I25.10 Atherosclerotic heart disease of native  coronary artery without angina pectoris 02/09/2022 No Yes I89.0 Lymphedema, not elsewhere classified 02/09/2022 No Yes Z86.718 Personal history of other venous thrombosis and embolism 02/09/2022 No Yes Inactive Problems ICD-10 [name], [name] ([id number].pdf Page 8 of 15 Code Description Active Date Inactive Date L97.512 Non-pressure chronic ulcer of other part of right foot with fat layer exposed 02/17/2022 02/17/2022 Resolved Problems Electronic Signature(s) Signed: 09/15/2022 1:16:52 PM By: [name] Maudlin MD FACS Entered By: [name] on 09/15/2022 13:16:52 -------------------------------------------------------------------------------- Progress Note Details Patient Name: Date of Service: [name] 09/15/2022 12:30 PM Medical Record Number: [id number] Patient Account Number: [id number] Date of Birth/Sex: Treating RN: 12-22-1950 (72 y.o. M) Primary Care Provider: PA [name], NO Other Clinician: Referring Provider: Treating Provider/Extender: [name] in Treatment: 31 Subjective Chief Complaint Information obtained from Patient Patient seen for complaints of Non-Healing Wounds. History of Present Illness (HPI) ADMISSION 02/09/2022 This is a 72 year old man who recently moved to [place] from [place]. He has a history of of coronary artery disease and prior left popliteal vein DVT . He is not diabetic and does not smoke. 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 He has had multiple spinal surgeries and has limited mobility. He primarily uses a motorized wheelchair. He has had lymphedema for many years and does have lymphedema pumps although he says he does not use them frequently. He has wounds on his bilateral lower extremities. He was receiving care in [place] for these prior to his move. They were apparently packing his wounds with Betadine soaked gauze. He has worn compression wraps in the past but  not recently. He did say that they helped tremendously. In clinic today, his right ABI is noncompressible but has good signals; the left ABI was 1.17. No formal vascular studies have been performed. On his left dorsal foot, there is a circular lesion that exposes the fat layer. There is fairly heavy slough accumulation within the wound, but no significant odor. On his right medial calf, he has multiple pinholes that have slough buildup in them and probe for about 2 to 4 mm in depth. They are draining clear fluid. On his right lateral midfoot, he has ulceration consistent with venous stasis. It is geographic and has slough accumulation. 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 02/17/2022: All of the wounds are little bit larger today and have  more slough accumulation. Today, the medial calf openings are larger and probe to something that feels calcified. There is odor coming from his wounds. His vascular studies are scheduled for August 9 and 10. 02-24-2022 upon evaluation today patient presents for follow-up concerning his ongoing issues here with his lower extremities. With that being said he does have significant problems with what appears to be cellulitis unfortunately the PCR culture that was sent last week got crushed by UPS in transit. With that being said we are going to reobtain a culture today although I am actually to do it on the right medial leg as this seems to be the most inflamed area today where there is some questionable drainage as well. I am going to remove some of the calcium deposits which are loosening obtain a deeper culture from this area. Fortunately I do not see any evidence of active infection systemically which is good news but locally I think we are having a bigger issue here. He does have his appointment next Thursday with vascular. Patient has also been referred to [facility] for  further evaluation and treatment of the calcinosis for possible sulfate injections. 03/04/2022: The culture that was performed last week grew out multiple species including Klebsiella, Morganella, and Pseudomonas aeruginosa. He had been prescribed Bactrim but this was not adequate to cover all of the species and levofloxacin was recommended. He completed the Bactrim but did not pick up the levofloxacin and begin taking it until yesterday. 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 He had arterial studies performed today. He does have evidence of peripheral arterial disease. Results copied here: +-------+-----------+-----------+------------+------------+ ABI/TBIT oday's ABIT oday's TBIPrevious ABIPrevious TBI +-------+-----------+-----------+------------+------------+ Right [place] 0.75    +-------+-----------+-----------+------------+------------+ Left [place] 0.57    +-------+-----------+-----------+------------+------------+ Arterial wall calcification precludes accurate ankle pressures and ABIs. Summary: Right: Resting right ankle-brachial index indicates noncompressible right lower extremity arteries. The right toe-brachial index is normal. Fawver, Berle ([id number]) [id number].pdf Page 9 of 15 Left: Resting left ankle-brachial index indicates noncompressible left lower extremity arteries. The left toe-brachial index is abnormal. He continues to have further tissue breakdown at the right medial calf and there is ample slough accumulation along with necrotic subcutaneous tissue. The left dorsal foot wound has built up slough, as well. The right medial foot wound is nearly closed with just a light layer of eschar over the surface. The right dorsal lateral foot wound has also accumulated a fair  amount of slough and remains quite tender. 03/10/2022: He has been taking the prescribed levofloxacin and has about 3 days left of this. The erythema and induration on his right leg has improved. He continues to experience further breakdown of the right medial calf wound. There is extensive slough and debris accumulation there. There is heavy slough on his left dorsal foot wound, but no odor or purulent drainage. The right medial foot wound appears to be closed. The right dorsal lateral foot wound also has a fair amount of slough but it is less tender than at our last visit. He still does not have an appointment with dermatology. 03/22/2022: The right anterior tibial wound has closed. The right lateral foot wound is nearly closed with just a little bit of slough. The left dorsal foot wound is smaller and continues to have some slough buildup but less so than at prior visits. There is good granulation tissue  underlying the slough. Unfortunately the right medial calf wound continues to deteriorate. It has a foul odor today and a lot of necrotic tissue, the surrounding skin is also erythematous and moist. 03/30/2022: The right lateral foot wound continues to contract and just has a little bit of slough and eschar present. The left dorsal foot wound is also smaller with slough overlying good granulation tissue. The right medial calf wound actually looks quite a bit better today; there is no odor and there is much less necrotic tissue although some remains present. We have been using topical gentamicin and he has been taking oral Augmentin. 04/07/2022: The patient saw dermatology at [facility] last week. Unfortunately, it appears that they did not read any of the documentation that was provided. 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 Overall, it was a highly unsatisfactory consultation. In the meantime, however, we were able to identify compounding pharmacy who could create a topical sodium thiosulfate ointment. The patient has that with him today. Both of the right foot wounds are now closed. The left dorsal foot wound has contracted but still has a layer of slough on the surface. The medial calf wounds on the right all look a little bit better. They still have heavy slough along with the chunks of calcium. Everything is stained purple. 04/14/2022: The wound on his dorsal left foot is a little bit smaller again today. There is still slough accumulation on the surface. The medial calf wounds have quite a bit less slough accumulation today. His right leg, however, is warm and erythematous, concerning for cellulitis. 04/14/2022: He completed his course of Augmentin yesterday. The medial calf wound sites are much cleaner today and shallower. There is less fragmented calcium in the wounds. He has a lot of lymphedema fluid-like drainage from these wounds, but no purulent discharge or malodor. The wound on his dorsal left foot has also contracted and is shallower. There is a layer of slough over healthy granulation tissue. 05/05/2022: The left dorsal foot wound is smaller today with less slough and more granulation tissue. The right medial calf wounds are shallower, but have extensive slough and some fat necrosis. The drainage has diminished considerably, however. 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 05/12/2022: The left dorsal foot wound continues to contract and fill with granulation tissue. There is slough on the wound surface. The right medial calf wounds also continue to fill with healthy tissue, but there is  remaining slough and fat necrosis present. He had a significant increase in his drainage this week and it was a bright yellow-green color. He also had a new wound open over his right anterior tibial surface associated with the spike of cutaneous calcium. The leg is more red, as well, concerning for infection. 05/19/2022: His culture returned positive for Pseudomonas. He is currently taking a course of oral levofloxacin. We have also been using topical gentamicin mixed in with his sodium thiosulfate. All of the wounds look better this week. 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 There is slough and nonviable subcutaneous tissue still present. The new anterior tibial wound just has a  light layer of slough. The dorsal foot wound also has some slough present and appears a little dry today. 05/26/2022: The right medial leg wound continues to improve. It is feeling with granulation tissue, but still accumulates a fairly thick layer of slough on the surface. The more recent anterior tibial wound has remained quite small, but also has some slough on the surface. The left dorsal foot wound has contracted somewhat and has perimeter epithelialization. He completed his oral levofloxacin. 06/02/2022: The left dorsal foot wound continues to improve significantly. There is just a little slough on the wound surface. The right medial leg wound had black drainage, but it looks like silver alginate was applied last week and I theorized that silver sulfide was created with a combination of the silver alginate and the sodium thiosulfate. It did, however, have a bit of an odor to it and extensive slough accumulation. The small anterior tibial wound also had a bit of slough and nonviable subcutaneous tissue present. The skin of his leg was rather macerated, as well. 06/09/2022: The left dorsal foot wound is smaller again this week with just a light layer of slough on the surface. The right  medial leg wound looks substantially better. The leg and periwound skin are no longer red and macerated. There is good granulation tissue forming with less slough and nonviable tissue. The anterior tibial wound just has a small amount of slough accumulation. 06/16/2022: The left dorsal foot wound continues to contract. His wrap slipped a little bit, however, and his foot is more swollen. The right medial leg wound continues to improve. The underlying tissue is more vital-appearing and there is less slough. He does have substantial drainage. The small anterior tibial wound has a bit of slough accumulation. 06/23/2022: The left dorsal foot wound is about half the size as it was last week. There is just some light slough on the surface and some eschar buildup around the edges. The right anterior leg wound is small with a little slough on the surface. Unfortunately, the right medial leg wound deteriorated fairly substantially. It is malodorous with gray/green/black discoloration. 06/30/2022: The left dorsal foot wound continues to contract. It is nearly closed with just a light layer of slough present. The right anterior leg wound is about the same size and has a small amount of slough on the surface. The right medial leg wound looks a lot better than it did last week. The odor has abated and the tissue is less pulpy and necrotic-looking. There is still a fairly thick layer of slough present. 12/13; the left dorsal foot wound looks very healthy. There was no need to change the dressing here and no debridement. He has a small area on the right anterior lower leg apparently had not changed much in size. The area on the medial leg is the most problematic area this is large with some depth and a completely nonviable surface today. 07/14/2022: The left dorsal foot wound is smaller today with just a little slough and eschar present. The right anterior lower leg wound is about the same without any significant  change. The medial leg wound has a black and green surface and is malodorous. No purulent drainage and the periwound skin is intact. Edema control is suboptimal. 07/21/2022: The dorsal foot wound continues to contract and is nearly closed with just a little bit of slough and eschar on the surface. The right anterior lower leg wound is shallower today but otherwise the dimensions are unchanged. The medial  leg wound looks significantly better although it still has a nonviable surface; the odor and black/green surface has resolved. His culture came back with multiple species sensitive to Augmentin, which was prescribed and a very low level of Pseudomonas, which should be handled by the topical gentamicin we have been using. 07/28/2022: The dorsal foot wound is down to just a very tiny opening with a little eschar on the surface. The right anterior tibial wound is about the same with some slough buildup. The right medial leg wound looks quite a bit better this week. There is thick rubbery slough on the surface, but the odor and drainage has improved significantly. [name], [name] ([id number]) [id number] Page 10 of 15 08/04/2022: The dorsal foot wound is almost healed, but the periwound skin has broken down. This appears to be secondary to moisture and adhesive from the absorbent pad. The right anterior tibial wound is shallower. The right medial leg wound continues to improve. It is smaller, still with rubbery slough on the surface. No malodor or purulent drainage. 08/11/2022: The dorsal foot wound is about the same size. It is a little bit dry with some slough accumulation. The right anterior tibial wound is also unchanged. The right medial leg wound is filling in further with good granulation tissue. Still with some slough buildup on the surface. 08/18/2022: His left leg is bright red, hot, and swollen with cellulitis. The dorsal foot wound actually looks quite good and is  superficial with just a little bit of slough accumulation. The right anterior tibial wound is unchanged. The right medial leg wound has filled in with good granulation tissue but has copious amounts of drainage. 08/25/2022: His cellulitis has responded nicely to the oral antibiotics. His dorsal foot wound is smaller but has a fair amount of slough buildup. The right anterior tibial wound remains stable and unchanged. The right medial leg wound has more granulation tissue under a layer of slough, but continues to have copious drainage. The drainage is actually causing skin irritation and threatens to result in breakdown. 09/01/2022: He continues to have profuse drainage from his wounds which is resulting in tissue maceration on both the dorsum of his left foot and the periwound distal to his medial leg wound. The dorsal foot wound is nearly healed, despite this. The medial right leg wound is filling in with good granulation tissue. We are working on getting home health assistance to change his dressings more frequently to try and control the drainage better. 09/08/2022: We have had success in controlling his drainage. The tissue maceration has improved significantly and his swelling has come down quite markedly. The medial right leg wound is much more superficial and has substantially less nonviable tissue present. The anterior tibial wound is a little bit larger, however with some nonviable tissue present. 09/15/2022: The dorsal foot wound is nearly closed. The anterior tibial wound measures slightly larger today. The medial right leg wound continues to improve quite dramatically with less nonviable tissue and more robust-looking granulation. Edema control is significantly better. Patient History Information obtained from Patient, Chart. Family History Cancer - Mother, Heart Disease - Father,Mother, Hypertension - Father,Siblings, Lung Disease - Siblings, No family history of Diabetes, Hereditary  Spherocytosis, Kidney Disease, Seizures, Stroke, Thyroid Problems, Tuberculosis. Social History Former smoker - quit 1991, Marital Status - Married, Alcohol Use - Moderate, Drug Use - No History, Caffeine Use - Daily - coffee. Medical History Eyes Patient has history of Cataracts - right eye removed Denies history of Glaucoma, Optic Neuritis  Ear/Nose/Mouth/Throat Denies history of Chronic sinus problems/congestion, Middle ear problems Hematologic/Lymphatic Patient has history of Anemia - macrocytic, Lymphedema Cardiovascular Patient has history of Congestive Heart Failure, Deep Vein Thrombosis - left leg, Hypertension, Peripheral Venous Disease Endocrine Denies history of Type I Diabetes, Type II Diabetes Genitourinary Denies history of End Stage Renal Disease Integumentary (Skin) Denies history of History of Burn Oncologic Denies history of Received Chemotherapy, Received Radiation Psychiatric Denies history of Anorexia/bulimia, Confinement Anxiety Hospitalization/Surgery History - cervical fusion. - lumbar surgery. - coronary stent placement. - lasik eye surgery. - hydrocele excision. Medical A Surgical History Notes nd Constitutional Symptoms (General Health) obesity Ear/Nose/Mouth/Throat hard of hearing Respiratory frozen diaphragm right Cardiovascular hyperlipidemia Musculoskeletal myositis, lumbar DDD, spinal stenosis, cervical facet joint syndrome Objective Constitutional no acute distress. Vitals Time Taken: 12:39 PM, Height: 71 in, Weight: 267 lbs, BMI: 37.2, Temperature: 97.6 F, Pulse: 67 bpm, Respiratory Rate: 20 breaths/min, Blood Pressure: 132/78 mmHg. RAKIM, ZHAI ([id number]) [id number] Page 11 of 15 Respiratory Normal work of breathing on room air. General Notes: 09/15/2022: The dorsal foot wound is nearly closed. The anterior tibial wound measures slightly larger today. The medial right leg wound continues to improve quite  dramatically with less nonviable tissue and more robust-looking granulation. Edema control is significantly better. Integumentary (Hair, Skin) Wound #1 status is Open. Original cause of wound was Gradually Appeared. The date acquired was: 12/07/2021. The wound has been in treatment 31 weeks. The wound is located on the Left,Dorsal Foot. The wound measures 0.5cm length x 0.4cm width x 0.1cm depth; 0.157cm^2 area and 0.016cm^3 volume. There is Fat Layer (Subcutaneous Tissue) exposed. There is no tunneling or undermining noted. There is a medium amount of serous drainage noted. The wound margin is distinct with the outline attached to the wound base. There is large (67-100%) pink granulation within the wound bed. There is a small (1-33%) amount of necrotic tissue within the wound bed including Adherent Slough. The periwound skin appearance exhibited: Dry/Scaly, Hemosiderin Staining. The periwound skin appearance did not exhibit: Excoriation, Maceration, Rubor, Erythema. Periwound temperature was noted as No Abnormality. The periwound has tenderness on palpation. Wound #3 status is Open. Original cause of wound was Gradually Appeared. The date acquired was: 12/07/2021. The wound has been in treatment 31 weeks. The wound is located on the Right,Medial Lower Leg. The wound measures 4cm length x 5.5cm width x 0.6cm depth; 17.279cm^2 area and 10.367cm^3 volume. There is Fat Layer (Subcutaneous Tissue) exposed. There is no tunneling or undermining noted. There is a medium amount of serosanguineous drainage noted. The wound margin is distinct with the outline attached to the wound base. There is medium (34-66%) red granulation within the wound bed. There is a medium (34-66%) amount of necrotic tissue within the wound bed including Adherent Slough. The periwound skin appearance had no abnormalities noted for texture. The periwound skin appearance exhibited: Hemosiderin Staining. The periwound skin appearance did  not exhibit: Dry/Scaly, Maceration, Rubor, Erythema. Periwound temperature was noted as No Abnormality. The periwound has tenderness on palpation. General Notes: can probe to calcifications Wound #6 status is Open. Original cause of wound was Gradually Appeared. The date acquired was: 05/12/2022. The wound has been in treatment 18 weeks. The wound is located on the Right,Anterior Lower Leg. The wound measures 0.8cm length x 0.6cm width x 0.5cm depth; 0.377cm^2 area and 0.188cm^3 volume. There is Fat Layer (Subcutaneous Tissue) exposed. There is no tunneling or undermining noted. There is a medium amount of purulent drainage noted. The wound margin  is distinct with the outline attached to the wound base. There is no granulation within the wound bed. There is a large (67-100%) amount of necrotic tissue within the wound bed including Adherent Slough. The periwound skin appearance had no abnormalities noted for texture. The periwound skin appearance had no abnormalities noted for moisture. The periwound skin appearance exhibited: Hemosiderin Staining. The periwound skin appearance did not exhibit: Atrophie Blanche, Cyanosis, Ecchymosis, Mottled, Pallor, Rubor, Erythema. Periwound temperature was noted as No Abnormality. 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 There was a Excisional Skin/Subcutaneous Tissue Debridement with a total area of 22 sq cm performed by [name] Maudlin, MD. With the following instrument(s): Curette to remove Viable and Non-Viable tissue/material. Material removed includes Subcutaneous Tissue and Slough and after achieving pain control using Lidocaine 4% T opical Solution. No specimens were taken. A time out was conducted at 13:05, prior to the start of the procedure. A Minimum amount of bleeding was controlled with Pressure. The procedure was tolerated well with a pain level of 3 throughout and a pain level of 2 following the procedure. Post Debridement Measurements: 4cm length x 5.5cm width x 0.6cm depth; 10.367cm^3 volume. Character of Wound/Ulcer Post Debridement is improved. Post procedure Diagnosis Wound #3: Same as Pre-Procedure General Notes: Scribed for Dr. Celine [name] by [name] Gouty, RN. Pre-procedure diagnosis of Wound #3 is a Lymphedema located on the Right,Medial Lower Leg . There was a Four Layer Compression Therapy Procedure by [name] Gouty, RN. 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 There was a Excisional Skin/Subcutaneous Tissue Debridement with a total area of 0.48 sq cm performed by [name] Maudlin, MD. With the following instrument(s): Curette to remove Viable and Non-Viable tissue/material. Material removed includes Subcutaneous Tissue and Slough and after achieving pain control using Lidocaine 4% T opical Solution. No specimens were taken. A time out was conducted at 13:05, prior to the start of the procedure. A Minimum amount of bleeding was controlled with Pressure. The procedure was tolerated well with a pain level of 3 throughout and a pain level of 1 following the procedure. Post Debridement Measurements: 0.8cm length x 0.6cm width x 0.2cm depth; 0.075cm^3 volume. Character of Wound/Ulcer Post Debridement is improved. Post procedure Diagnosis Wound #6: Same as Pre-Procedure General Notes:  Scribed for Dr. Celine [name] by [name] Gouty, RN. Wound #1 Pre-procedure diagnosis of Wound #1 is a Lymphedema located on the Left,Dorsal Foot . There was a Four Layer Compression Therapy Procedure by [name] Gouty, RN. Post procedure Diagnosis Wound #1: Same as Pre-Procedure [name], [name] ([id number]) [id number] Page 12 of 15 Plan Follow-up Appointments: Return Appointment in 1 week. - Dr. Celine [name] Rm 1 Wednesday 2/28 @ 1230 pm Anesthetic: Wound #1 Left,Dorsal Foot: (In clinic) Topical Lidocaine 4% applied to wound bed Wound #3 Right,Medial Lower Leg: (In clinic) Topical Lidocaine 4% applied to wound bed Wound #6 Right,Anterior Lower Leg: (In clinic) Topical Lidocaine 4% applied to wound bed Bathing/ Shower/ Hygiene: May shower with protection but do  not get wound dressing(s) wet. Protect dressing(s) with water repellant cover (for example, large plastic bag) or a cast cover and may then take shower. Edema Control - Lymphedema / SCD / Other: Lymphedema Pumps. Use Lymphedema pumps on leg(s) 2-3 times a day for 45-60 minutes. If wearing any wraps or hose, do not remove them. Continue exercising as instructed. - use 1-2 times per day over wraps Elevate legs to the level of the heart or above for 30 minutes daily and/or when sitting for 3-4 times a day throughout the day. - throughout the day Avoid standing for long periods of time. Exercise regularly Home Health: No change in wound care orders this week; continue Home Health for wound care. May utilize formulary equivalent dressing for wound treatment orders unless otherwise specified. Dressing changes to be completed by [facility] on Monday / Wednesday / Friday except when patient has scheduled visit at [facility]. - pt seen in clinic on Wednesday Other [place] Orders/Instructions: - [facility] WOUND #1: - Foot Wound Laterality: Dorsal, Left Peri-Wound Care: Zinc Oxide Ointment 30g tube 3 x Per Week/30  Days Discharge Instructions: Apply Zinc Oxide to periwound with each dressing change as needed for excoriation Peri-Wound Care: Sween Lotion (Moisturizing lotion) 3 x Per Week/30 Days Discharge Instructions: Apply moisturizing lotion as directed Prim Dressing: Sorbalgon AG Dressing, 4x4 (in/in) (Generic) 3 x Per Week/30 Days ary Discharge Instructions: or equivalent silver alginate. Apply to wound bed as instructed Secondary Dressing: ABD Pad, 5x9 (Generic) 3 x Per Week/30 Days Discharge Instructions: Apply over primary dressing as directed. Secondary Dressing: Woven Gauze Sponge, Non-Sterile 4x4 in (Generic) 3 x Per Week/30 Days Discharge Instructions: Apply over primary dressing as directed. Com pression Wrap: FourPress (4 layer compression wrap) (Generic) 3 x Per Week/30 Days Discharge Instructions: Apply four layer compression as directed. May also use Urgo K2 compression system as alternative. WOUND #3: - Lower Leg Wound Laterality: Right, Medial Peri-Wound Care: Triamcinolone 15 (g) 1 x Per Week/30 Days Discharge Instructions: Use triamcinolone 15 (g) as directed Peri-Wound Care: Zinc Oxide Ointment 30g tube 1 x Per Week/30 Days Discharge Instructions: Apply Zinc Oxide to excoriated periwound with each dressing change as needed Peri-Wound Care: Sween Lotion (Moisturizing lotion) 1 x Per Week/30 Days Discharge Instructions: Apply moisturizing lotion as directed Prim Dressing: Santyl Ointment 1 x Per Week/30 Days ary Discharge Instructions: to wound bed mixed 1:1 with sodium thiosulfate ointment Prim Dressing: sodium thiosulfate ointment 1 x Per Week/30 Days ary Discharge Instructions: 1:1 mix with santyl to wound bed Secondary Dressing: ABD Pad, 8x10 (Generic) 1 x Per Week/30 Days Discharge Instructions: Apply over primary dressing as directed. Secondary Dressing: Woven Gauze Sponge, Non-Sterile 4x4 in (Generic) 1 x Per Week/30 Days Discharge Instructions: Apply over primary  dressing as directed. Com pression Wrap: FourPress (4 layer compression wrap) (Generic) 1 x Per Week/30 Days Discharge Instructions: Apply four layer compression as directed. May also use Urgo K2 compression system as alternative. WOUND #6: - Lower Leg Wound Laterality: Right, Anterior Peri-Wound Care: Triamcinolone 15 (g) 3 x Per Week/30 Days Discharge Instructions: Use triamcinolone 15 (g) ( in clinic) Peri-Wound Care: Zinc Oxide Ointment 30g tube 3 x Per Week/30 Days Discharge Instructions: Apply Zinc Oxide to periwound with each dressing change as needed for excoriation Peri-Wound Care: Sween Lotion (Moisturizing lotion) 3 x Per Week/30 Days Discharge Instructions: Apply moisturizing lotion as directed Prim Dressing: sodium thiosulfate ointment 3 x Per Week/30 Days ary Discharge Instructions: to wound bed with each  dressing change Secondary Dressing: ABD Pad, 5x9 (Generic) 3 x Per Week/30 Days Discharge Instructions: Apply over primary dressing as directed. Secondary Dressing: Woven Gauze Sponge, Non-Sterile 4x4 in (Generic) 3 x Per Week/30 Days Discharge Instructions: Apply over primary dressing as directed. Com pression Wrap: FourPress (4 layer compression wrap) (Generic) 3 x Per Week/30 Days Discharge Instructions: Apply four layer compression as directed. May also use Urgo K2 compression system as alternative. 09/15/2022: The dorsal foot wound is nearly closed. The anterior tibial wound measures slightly larger today. The medial right leg wound continues to improve quite dramatically with less nonviable tissue and more robust-looking granulation. Edema control is significantly better. The left dorsal foot wound did not require debridement. I debrided slough and nonviable subcutaneous tissue from both right leg wounds. We will continue Santyl with compounded sodium thiosulfate on the right leg and silver alginate on the left foot. Continue bilateral 4-layer compression. Follow-up in 1  week. [name], [name] ([id number]) [id number] Page 13 of 15 Electronic Signature(s) Signed: 09/15/2022 1:21:36 PM By: [name] Maudlin MD FACS Entered By: [name] on 09/15/2022 13:21:35 -------------------------------------------------------------------------------- HxROS Details Patient Name: Date of Service: NITESH, KRETZER [name] 09/15/2022 12:30 PM Medical Record Number: [id number] Patient Account Number: [id number] Date of Birth/Sex: Treating RN: 07-28-1950 (72 y.o. M) Primary Care Provider: PA [name], NO Other Clinician: Referring Provider: Treating Provider/Extender: [name] in Treatment: 31 Information Obtained From Patient Chart Constitutional Symptoms (General Health) Medical History: Past Medical History Notes: obesity Eyes Medical History: Positive for: Cataracts - right eye removed Negative for: Glaucoma; Optic Neuritis Ear/Nose/Mouth/Throat Medical History: Negative for: Chronic sinus problems/congestion; Middle ear problems Past Medical History Notes: hard of hearing Hematologic/Lymphatic Medical History: Positive for: Anemia - macrocytic; Lymphedema Respiratory Medical History: Past Medical History Notes: frozen diaphragm right Cardiovascular Medical History: Positive for: Congestive Heart Failure; Deep Vein Thrombosis - left leg; Hypertension; Peripheral Venous Disease Past Medical History Notes: hyperlipidemia Endocrine Medical History: Negative for: Type I Diabetes; Type II Diabetes Genitourinary Medical History: Negative for: End Stage Renal Disease Integumentary (Skin) Medical History: Negative for: History of Burn Musculoskeletal WYLEY, YUNG ([id number] Page 14 of 15 Medical History: Past Medical History Notes: myositis, lumbar DDD, spinal stenosis, cervical facet joint syndrome Oncologic Medical History: Negative for: Received Chemotherapy;  Received Radiation Psychiatric Medical History: Negative for: Anorexia/bulimia; Confinement Anxiety HBO Extended History Items Eyes: Cataracts Immunizations Pneumococcal Vaccine: Received Pneumococcal Vaccination: Yes Received Pneumococcal Vaccination On or After 60th Birthday: Yes Implantable Devices No devices added Hospitalization / Surgery History Type of Hospitalization/Surgery cervical fusion lumbar surgery coronary stent placement lasik eye surgery hydrocele excision Family and Social History Cancer: Yes - Mother; Diabetes: No; Heart Disease: Yes - Father,Mother; Hereditary Spherocytosis: No; Hypertension: Yes - Father,Siblings; Kidney Disease: No; Lung Disease: Yes - Siblings; Seizures: No; Stroke: No; Thyroid Problems: No; Tuberculosis: No; Former smoker - quit 1991; Marital Status - Married; Alcohol Use: Moderate; Drug Use: No History; Caffeine Use: Daily - coffee; Financial Concerns: No; Food, Clothing or Shelter Needs: No; Support System Lacking: No; Transportation Concerns: No Electronic Signature(s) Signed: 09/15/2022 2:07:52 PM By: [name] Maudlin MD FACS Entered By: [name] on 09/15/2022 13:20:06 -------------------------------------------------------------------------------- SuperBill Details Patient Name: Date of Service: DYWAYNE, GAMEROS 09/15/2022 Medical Record Number: [id number] Patient Account Number: [id number] Date of Birth/Sex: Treating RN: 04/17/1951 (72 y.o. M) Primary Care Provider: PA Darnelle Spangle Other Clinician: Referring Provider: Treating Provider/Extender: [name] in Treatment: 31 Diagnosis Coding ICD-10 Codes Code Description [id number] Non-pressure chronic ulcer  of other part of right lower leg with fat layer exposed L97.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) I10 Essential (primary)  hypertension I25.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 Depascale, [name] ([id number] Page 15 of 15 I89.0 Lymphedema, not elsewhere classified Z86.718 Personal history of other venous thrombosis and embolism Facility Procedures : CPT4 Code: [id number] Description: [id number] - DEB SUBQ TISSUE 20 SQ CM/< ICD-10 Diagnosis Description [id number] 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 Modifier: Quantity: 1 : CPT4 Code: [id number] Description: [id number] - DEB SUBQ TISS EA ADDL 20CM ICD-10 Diagnosis Description [id number] 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 Modifier: Quantity: 1 : CPT4 Code: [id number] Description: (Facility Use Only) [id number] - [place] COMPRS LWR LT LEG Modifier: 59 Quantity: 1 Physician Procedures : CPT4 Code Description Modifier [id number] - WC PHYS LEVEL 4 - EST PT 25 ICD-10 Diagnosis Description [id number] Non-pressure chronic ulcer of other part of right lower leg with fat layer exposed L97.522 Non-pressure chronic ulcer of other part of left  foot with fat layer exposed 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) Quantity: 1 : [id number] - WC PHYS SUBQ TISS 20 SQ CM ICD-10 Diagnosis Description [id number] 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 Quantity: 1 : [id number] - WC PHYS SUBQ TISS EA ADDL 20 CM ICD-10 Diagnosis Description [id number] 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 Quantity: 1 Electronic Signature(s) Signed: 09/15/2022 2:07:52 PM By: [name] Maudlin MD FACS Signed: 09/15/2022 5:50:47 PM By: [name] Gouty RN, BSN Previous Signature: 09/15/2022 1:22:25 PM Version By: [name] Maudlin MD  FACS Entered By: [name] on 09/15/2022 13:37:49

## 2022-09-16 NOTE — Progress Notes (Signed)
[name], [name] ([id number]) [id number] Page 1 of 11 Visit Report for 09/15/2022 Arrival Information Details Patient Name: Date of Service: [name], [name] 09/15/2022 12:30 PM Medical Record Number: [id number] Patient Account Number: [id number] Date of Birth/Sex: Treating RN: May 27, 1951 (72 y.o. [name] Primary Care Atzel Mccambridge: PA [name], [place] Other Clinician: Referring Aysha Livecchi: Treating Faiz Weber/Extender: [name] in Treatment: 62 Visit Information History Since Last Visit Added or deleted any medications: No Patient Arrived: Wheel Chair Any new allergies or adverse reactions: No Arrival Time: 12:18 Had a fall or experienced change in No Accompanied By: self activities of daily living that may affect Transfer Assistance: None risk of falls: Patient Identification Verified: Yes Signs or symptoms of abuse/neglect since last visito No Secondary Verification Process Completed: Yes Hospitalized since last visit: No Patient Requires Transmission-Based Precautions: No Implantable device outside of the clinic excluding No Patient Has Alerts: Yes cellular tissue based products placed in the center Patient Alerts: R ABI= N/C, TBI= .75 since last visit: L ABI =N/C, TBI = .57 Has Dressing in Place as Prescribed: Yes Has Compression in Place as Prescribed: Yes Pain Present Now: No Electronic Signature(s) Signed: 09/15/2022 5:50:47 PM By: Baruch Gouty RN, BSN Entered By: Baruch Gouty on 09/15/2022 12:19:05 -------------------------------------------------------------------------------- Compression Therapy Details Patient Name: Date of Service: [name], [name] 09/15/2022 12:30 PM Medical Record Number: [id number] Patient Account Number: [id number] Date of Birth/Sex: Treating RN: 13-Jan-1951 (72 y.o. [name] Primary Care Lori Popowski: PA [name], [place] Other Clinician: Referring Denai Caba: Treating Aryianna Earwood/Extender: [name] in Treatment: 31 Compression Therapy Performed for Wound Assessment: Wound #3 Right,Medial Lower Leg Performed By: Clinician Baruch Gouty, RN Compression Type: Four Layer Post Procedure Diagnosis Same as Pre-procedure Electronic Signature(s) Signed: 09/15/2022 5:50:47 PM By: Baruch Gouty RN, BSN Entered By: Baruch Gouty on 09/15/2022 13:02:55 [name] ([id number] Page 2 of 11 -------------------------------------------------------------------------------- Compression Therapy Details Patient Name: Date of Service: [name], [name] 09/15/2022 12:30 PM Medical Record Number: [id number] Patient Account Number: [id number] Date of Birth/Sex: Treating RN: 07/26/1951 (72 y.o. [name] Primary Care Graciano Batson: PA [name], [place] Other Clinician: Referring Joas Motton: Treating Johnson Arizola/Extender: [name] in Treatment: 31 Compression Therapy Performed for Wound Assessment: Wound #1 Left,Dorsal Foot Performed By: Clinician Baruch Gouty, RN Compression Type: Four Layer Post Procedure Diagnosis Same as Pre-procedure Electronic Signature(s) Signed: 09/15/2022 5:50:47 PM By: Baruch Gouty RN, BSN Entered By: Baruch Gouty on 09/15/2022 13:02:55 -------------------------------------------------------------------------------- Encounter Discharge Information Details Patient Name: Date of Service: [name], [name] 09/15/2022 12:30 PM Medical Record Number: [id number] Patient Account Number: [id number] Date of Birth/Sex: Treating RN: 23-Jun-1951 (72 y.o. [name] Primary Care Reene Harlacher: PA [name], [place] Other Clinician: Referring Marquetta Weiskopf: Treating Min Collymore/Extender: [name] in Treatment: 31 Encounter Discharge Information Items Post Procedure Vitals Discharge Condition: Stable Temperature (F): 97.6 Ambulatory Status: Wheelchair Pulse (bpm): 67 Discharge  Destination: Home Respiratory Rate (breaths/min): 18 Transportation: Private Auto Blood Pressure (mmHg): 132/78 Accompanied By: self Schedule Follow-up Appointment: Yes Clinical Summary of Care: Patient Declined Electronic Signature(s) Signed: 09/15/2022 5:50:47 PM By: Baruch Gouty RN, BSN Entered By: Baruch Gouty on 09/15/2022 13:38:48 -------------------------------------------------------------------------------- Lower Extremity Assessment Details Patient Name: Date of Service: [name], [name] 09/15/2022 12:30 PM Medical Record Number: [id number] Patient Account Number: [id number] Date of Birth/Sex: Treating RN: 1951-05-27 (72 y.o. [name] Primary Care Charlaine Utsey: PA [name], [place] Other Clinician: Referring Breylen Agyeman: Treating Diva Lemberger/Extender: Valeria Batman Weeks in Treatment: 31 Edema Assessment Assessed: [Left:  No] [Right: No] Edema: [Left: Yes] [Right: Yes] Calf EMIEL, LEONG ([id number]) [id number] Page 3 of 11 Left: Right: Point of Measurement: From Medial Instep 40.5 cm 42.4 cm Ankle Left: Right: Point of Measurement: From Medial Instep 23 cm 25.3 cm Vascular Assessment Pulses: Dorsalis Pedis Palpable: [Left:Yes] [Right:Yes] Electronic Signature(s) Signed: 09/15/2022 5:50:47 PM By: Baruch Gouty RN, BSN Entered By: Baruch Gouty on 09/15/2022 12:46:31 -------------------------------------------------------------------------------- Multi Wound Chart Details Patient Name: Date of Service: [name] 09/15/2022 12:30 PM Medical Record Number: [id number] Patient Account Number: [id number] Date of Birth/Sex: Treating RN: 19-Aug-1950 (72 y.o. M) Primary Care Trayce Caravello: PA [name], [place] Other Clinician: Referring Emalyn Schou: Treating Brooke Payes/Extender: [name], [name] in Treatment: 31 Vital Signs Height(in): 71 Pulse(bpm): 47 Weight(lbs): 30 Blood Pressure(mmHg): 132/78 Body Mass Index(BMI):  37.2 Temperature(F): 97.6 Respiratory Rate(breaths/min): 20 [1:Photos:] [3:No Photos] Left, Dorsal Foot Right, Medial Lower Leg Right, Anterior Lower Leg Wound Location: Gradually Appeared Gradually Appeared Gradually Appeared Wounding Event: Lymphedema Lymphedema Calcinosis Primary Etiology: Cataracts, Anemia, Lymphedema, Cataracts, Anemia, Lymphedema, Cataracts, Anemia, Lymphedema, Comorbid History: Congestive Heart Failure, Deep Vein Congestive Heart Failure, Deep Vein Congestive Heart Failure, Deep Vein Thrombosis, Hypertension, Peripheral Thrombosis, Hypertension, Peripheral Thrombosis, Hypertension, Peripheral Venous Disease Venous Disease Venous Disease 12/07/2021 12/07/2021 05/12/2022 Date Acquired: 31 31 18 $ Weeks of Treatment: Open Open Open Wound Status: No No No Wound Recurrence: 0.5x0.4x0.1 4x5.5x0.6 0.8x0.6x0.5 Measurements L x W x D (cm) 0.157 17.279 0.377 A (cm) : rea 0.016 10.367 0.188 Volume (cm) : 95.90% -223.50% -59.70% % Reduction in Area: 98.90% -385.30% -683.30% % Reduction in Volume: Full Thickness Without Exposed Full Thickness With Exposed Support Full Thickness Without Exposed Classification: Support Structures Structures Support Structures Medium Medium Medium Exudate Amount: Serous Serosanguineous Purulent Exudate Type: amber red, brown yellow, brown, green Exudate Color: Distinct, outline attached Distinct, outline attached Distinct, outline attached Wound Margin: Large (67-100%) Medium (34-66%) None Present (0%) Granulation AmountFENRIS, WOODY ([id number] Page 4 of 11 Pink Red N/A Granulation Quality: Small (1-33%) Medium (34-66%) Large (67-100%) Necrotic Amount: Fat Layer (Subcutaneous Tissue): Yes Fat Layer (Subcutaneous Tissue): Yes Fat Layer (Subcutaneous Tissue): Yes Exposed Structures: Fascia: No Fascia: No Fascia: No Tendon: No Tendon: No Tendon: No Muscle: No Muscle: No Muscle:  No Joint: No Joint: No Joint: No Bone: No Bone: No Bone: No Small (1-33%) Small (1-33%) None Epithelialization: N/A Debridement - Excisional Debridement - Excisional Debridement: Pre-procedure Verification/Time Out N/A 13:05 13:05 Taken: N/A Lidocaine 4% Topical Solution Lidocaine 4% Topical Solution Pain Control: N/A Subcutaneous, Slough Subcutaneous, Slough Tissue Debrided: N/A Skin/Subcutaneous Tissue Skin/Subcutaneous Tissue Level: N/A 22 0.48 Debridement A (sq cm): rea N/A Curette Curette Instrument: N/A Minimum Minimum Bleeding: N/A Pressure Pressure Hemostasis A chieved: N/A 3 3 Procedural Pain: N/A 2 1 Post Procedural Pain: N/A Procedure was tolerated well Procedure was tolerated well Debridement Treatment Response: N/A 4x5.5x0.6 0.8x0.6x0.2 Post Debridement Measurements L x W x D (cm) N/A 10.367 0.075 Post Debridement Volume: (cm) Excoriation: No Excoriation: No Excoriation: No Periwound Skin Texture: Induration: No Callus: No Crepitus: No Rash: No Scarring: No Dry/Scaly: Yes Maceration: No Maceration: No Periwound Skin Moisture: Maceration: No Dry/Scaly: No Dry/Scaly: No Hemosiderin Staining: Yes Hemosiderin Staining: Yes Hemosiderin Staining: Yes Periwound Skin Color: Erythema: No Erythema: No Atrophie Blanche: No Rubor: No Rubor: No Cyanosis: No Ecchymosis: No Erythema: No Mottled: No Pallor: No Rubor: No No Abnormality No Abnormality No Abnormality Temperature: Yes Yes N/A Tenderness on Palpation: N/A can probe to calcifications N/A Assessment Notes: Compression Therapy Compression Therapy Debridement  Procedures Performed: Debridement Treatment Notes Electronic Signature(s) Signed: 09/15/2022 1:16:57 PM By: Fredirick Maudlin MD FACS Entered By: Fredirick Maudlin on 09/15/2022 13:16:57 -------------------------------------------------------------------------------- Multi-Disciplinary Care Plan Details Patient Name: Date of  Service: THEORDORE, VOLBRECHT 09/15/2022 12:30 PM Medical Record Number: [id number] Patient Account Number: [id number] Date of Birth/Sex: Treating RN: Jul 13, 1951 (72 y.o. [name] Primary Care Takoda Siedlecki: PA [name], [place] Other Clinician: Referring [place] Ice: Treating Davonda Ausley/Extender: [name] in Treatment: [place] reviewed with physician Active Inactive Venous Leg Ulcer Nursing Diagnoses: Knowledge deficit related to disease process and management Potential for venous Insuffiency (use before diagnosis confirmed) RETT, SINGLETON ([id number]) [id number] Page 5 of 11 Goals: Patient will maintain optimal edema control Date Initiated: 02/17/2022 Target Resolution Date: 09/29/2022 Goal Status: Active Interventions: Assess peripheral edema status every visit. Compression as ordered Provide education on venous insufficiency Treatment Activities: Non-invasive vascular studies : 02/17/2022 Therapeutic compression applied : 02/17/2022 Notes: Wound/Skin Impairment Nursing Diagnoses: Impaired tissue integrity Knowledge deficit related to ulceration/compromised skin integrity Goals: Patient/caregiver will verbalize understanding of skin care regimen Date Initiated: 02/17/2022 Target Resolution Date: 09/29/2022 Goal Status: Active Ulcer/skin breakdown will have a volume reduction of 30% by week 4 Date Initiated: 02/17/2022 Date Inactivated: 03/10/2022 Target Resolution Date: 03/10/2022 Unmet Reason: infection being treated, Goal Status: Unmet waiting on derm appte Ulcer/skin breakdown will have a volume reduction of 50% by week 8 Date Initiated: 03/10/2022 Date Inactivated: 04/14/2022 Target Resolution Date: 04/07/2022 Goal Status: Unmet Unmet Reason: calcinosis Ulcer/skin breakdown will have a volume reduction of 80% by week 12 Date Initiated: 04/14/2022 Date Inactivated: 05/05/2022 Target Resolution Date:  05/05/2022 Unmet Reason: infection, chronic Goal Status: Unmet condition Interventions: Assess patient/caregiver ability to obtain necessary supplies Assess patient/caregiver ability to perform ulcer/skin care regimen upon admission and as needed Assess ulceration(s) every visit Provide education on ulcer and skin care Treatment Activities: Skin care regimen initiated : 02/17/2022 Topical wound management initiated : 02/17/2022 Notes: Electronic Signature(s) Signed: 09/15/2022 5:50:47 PM By: Baruch Gouty RN, BSN Entered By: Baruch Gouty on 09/15/2022 12:19:41 -------------------------------------------------------------------------------- Pain Assessment Details Patient Name: Date of Service: CARLE, LAQUE 09/15/2022 12:30 PM Medical Record Number: [id number] Patient Account Number: [id number] Date of Birth/Sex: Treating RN: [birth year] (72 y.o. [name] Primary Care Kynzee Devinney: PA [name], [place] Other Clinician: Referring Nickalus Thornsberry: Treating Jonpaul Lumm/Extender: [name] in Treatment: 31 Active Problems Location of Pain Severity and Description of Pain Patient Has Paino No SHONDALE, SLAVINSKI ([id number]) [id number] Page 6 of 11 Patient Has Paino No Site Locations Rate the pain. Current Pain Level: 0 Pain Management and Medication Current Pain Management: Electronic Signature(s) Signed: 09/15/2022 5:50:47 PM By: Baruch Gouty RN, BSN Entered By: Baruch Gouty on 09/15/2022 12:40:07 -------------------------------------------------------------------------------- Patient/Caregiver Education Details Patient Name: Date of Service: PATIENCE, BENION 2/21/2024andnbsp12:30 PM Medical Record Number: [id number] Patient Account Number: [id number] Date of Birth/Gender: Treating RN: October 10, 1950 (72 y.o. [name] Primary Care Physician: PA [name], [place] Other Clinician: Referring Physician: Treating Physician/Extender: [name] in Treatment: 31 Education Assessment Education Provided To: Patient Education Topics Provided Venous: Methods: Explain/Verbal Responses: Reinforcements needed, State content correctly Wound/Skin Impairment: Methods: Explain/Verbal Responses: Reinforcements needed, State content correctly Electronic Signature(s) Signed: 09/15/2022 5:50:47 PM By: Baruch Gouty RN, BSN Entered By: Baruch Gouty on 09/15/2022 12:20:15 [name] ([id number] Page 7 of 11 -------------------------------------------------------------------------------- Wound Assessment Details Patient Name: Date of Service: ARIELLE, HOLLINS 09/15/2022 12:30 PM Medical Record Number: [id number] Patient Account Number: [id number] Date of Birth/Sex:  Treating RN: 05-07-1951 (72 y.o. [name] Primary Care Jemarcus Dougal: PA [name], [place] Other Clinician: Referring Collyn Ribas: Treating Joao Mccurdy/Extender: [name] in Treatment: 31 Wound Status Wound Number: 1 Primary Lymphedema Etiology: Wound Location: Left, Dorsal Foot Wound Open Wounding Event: Gradually Appeared Status: Date Acquired: 12/07/2021 Comorbid Cataracts, Anemia, Lymphedema, Congestive Heart Failure, Deep Weeks Of Treatment: 31 History: Vein Thrombosis, Hypertension, Peripheral Venous Disease Clustered Wound: No Photos Wound Measurements Length: (cm) 0.5 Width: (cm) 0.4 Depth: (cm) 0.1 Area: (cm) 0.157 Volume: (cm) 0.016 % Reduction in Area: 95.9% % Reduction in Volume: 98.9% Epithelialization: Small (1-33%) Tunneling: No Undermining: No Wound Description Classification: Full Thickness Without Exposed Suppor Wound Margin: Distinct, outline attached Exudate Amount: Medium Exudate Type: Serous Exudate Color: amber t Structures Foul Odor After Cleansing: No Slough/Fibrino Yes Wound Bed Granulation Amount: Large (67-100%) Exposed Structure Granulation  Quality: Pink Fascia Exposed: No Necrotic Amount: Small (1-33%) Fat Layer (Subcutaneous Tissue) Exposed: Yes Necrotic Quality: Adherent Slough Tendon Exposed: No Muscle Exposed: No Joint Exposed: No Bone Exposed: No Periwound Skin Texture Texture Color No Abnormalities Noted: No No Abnormalities Noted: No Excoriation: No Erythema: No Hemosiderin Staining: Yes Moisture Rubor: No No Abnormalities Noted: No Dry / Scaly: Yes Temperature / Pain Maceration: No Temperature: No Abnormality Tenderness on Palpation: Yes Treatment Notes Wound #1 (Foot) Wound Laterality: Dorsal, Left Cleanser Peri-Wound Care Zinc Oxide Ointment 30g tube Discharge Instruction: Apply Zinc Oxide to periwound with each dressing change as needed for excoriation THAWNG, MARCHANT ([id number] Page 8 of 11 Sween Lotion (Moisturizing lotion) Discharge Instruction: Apply moisturizing lotion as directed Topical Primary Dressing Sorbalgon AG Dressing, 4x4 (in/in) Discharge Instruction: or equivalent silver alginate. Apply to wound bed as instructed Secondary Dressing ABD Pad, 5x9 Discharge Instruction: Apply over primary dressing as directed. Woven Gauze Sponge, Non-Sterile 4x4 in Discharge Instruction: Apply over primary dressing as directed. Secured With Compression Wrap FourPress (4 layer compression wrap) Discharge Instruction: Apply four layer compression as directed. May also use Urgo K2 compression system as alternative. Compression Stockings Add-Ons Electronic Signature(s) Signed: 09/15/2022 5:50:47 PM By: Baruch Gouty RN, BSN Entered By: Baruch Gouty on 09/15/2022 13:00:40 -------------------------------------------------------------------------------- Wound Assessment Details Patient Name: Date of Service: OLUWATONI, MELENDREZ 09/15/2022 12:30 PM Medical Record Number: [id number] Patient Account Number: [id number] Date of Birth/Sex: Treating RN: 07/31/1950 (72  y.o. [name] Primary Care Jerri Glauser: PA [name], [place] Other Clinician: Referring Jenne Sellinger: Treating Adianna Darwin/Extender: [name] in Treatment: 31 Wound Status Wound Number: 3 Primary Lymphedema Etiology: Wound Location: Right, Medial Lower Leg Wound Open Wounding Event: Gradually Appeared Status: Date Acquired: 12/07/2021 Comorbid Cataracts, Anemia, Lymphedema, Congestive Heart Failure, Deep Weeks Of Treatment: 31 History: Vein Thrombosis, Hypertension, Peripheral Venous Disease Clustered Wound: No Wound Measurements Length: (cm) 4 Width: (cm) 5.5 Depth: (cm) 0.6 Area: (cm) 17.279 Volume: (cm) 10.367 % Reduction in Area: -223.5% % Reduction in Volume: -385.3% Epithelialization: Small (1-33%) Tunneling: No Undermining: No Wound Description Classification: Full Thickness With Exposed Suppo Wound Margin: Distinct, outline attached Exudate Amount: Medium Exudate Type: Serosanguineous Exudate Color: red, brown rt Structures Foul Odor After Cleansing: No Slough/Fibrino Yes Wound Bed Granulation Amount: Medium (34-66%) Exposed Structure Granulation Quality: Red Fascia Exposed: No Necrotic Amount: Medium (34-66%) Fat Layer (Subcutaneous Tissue) Exposed: Yes Necrotic Quality: Adherent Slough Tendon Exposed: No Muscle Exposed: No Petrak, [name] ([id number] Page 9 of 11 Joint Exposed: No Bone Exposed: No Periwound Skin Texture Texture Color No Abnormalities Noted: Yes No Abnormalities Noted: No Erythema: No Moisture Hemosiderin  Staining: Yes No Abnormalities Noted: No Rubor: No Dry / Scaly: No Maceration: No Temperature / Pain Temperature: No Abnormality Tenderness on Palpation: Yes Assessment Notes can probe to calcifications Treatment Notes Wound #3 (Lower Leg) Wound Laterality: Right, Medial Cleanser Peri-Wound Care Triamcinolone 15 (g) Discharge Instruction: Use triamcinolone 15 (g) as  directed Zinc Oxide Ointment 30g tube Discharge Instruction: Apply Zinc Oxide to excoriated periwound with each dressing change as needed Sween Lotion (Moisturizing lotion) Discharge Instruction: Apply moisturizing lotion as directed Topical Primary Dressing Santyl Ointment Discharge Instruction: to wound bed mixed 1:1 with sodium thiosulfate ointment sodium thiosulfate ointment Discharge Instruction: 1:1 mix with santyl to wound bed Secondary Dressing ABD Pad, 8x10 Discharge Instruction: Apply over primary dressing as directed. Woven Gauze Sponge, Non-Sterile 4x4 in Discharge Instruction: Apply over primary dressing as directed. Secured With Compression Wrap FourPress (4 layer compression wrap) Discharge Instruction: Apply four layer compression as directed. May also use Urgo K2 compression system as alternative. Compression Stockings Add-Ons Electronic Signature(s) Signed: 09/15/2022 5:50:47 PM By: Baruch Gouty RN, BSN Entered By: Baruch Gouty on 09/15/2022 13:01:27 -------------------------------------------------------------------------------- Wound Assessment Details Patient Name: Date of Service: DELLON, REVETTE 09/15/2022 12:30 PM Medical Record Number: [id number] Patient Account Number: [id number] Date of Birth/Sex: Treating RN: 1950-12-10 (72 y.o. [name] Primary Care Salar Molden: PA [name], [place] Other Clinician: Referring Estefani Bateson: Treating Francisca Langenderfer/Extender: [name] in Treatment: [address] [place] ([id number]) [id number] Page 10 of 11 Wound Status Wound Number: 6 Primary Calcinosis Etiology: Wound Location: Right, Anterior Lower Leg Wound Open Wounding Event: Gradually Appeared Status: Date Acquired: 05/12/2022 Comorbid Cataracts, Anemia, Lymphedema, Congestive Heart Failure, Deep Weeks Of Treatment: 18 History: Vein Thrombosis, Hypertension, Peripheral Venous Disease Clustered Wound:  No Photos Wound Measurements Length: (cm) 0.8 Width: (cm) 0.6 Depth: (cm) 0.5 Area: (cm) 0.377 Volume: (cm) 0.188 % Reduction in Area: -59.7% % Reduction in Volume: -683.3% Epithelialization: None Tunneling: No Undermining: No Wound Description Classification: Full Thickness Without Exposed Suppor Wound Margin: Distinct, outline attached Exudate Amount: Medium Exudate Type: Purulent Exudate Color: yellow, brown, green t Structures Foul Odor After Cleansing: No Slough/Fibrino Yes Wound Bed Granulation Amount: None Present (0%) Exposed Structure Necrotic Amount: Large (67-100%) Fascia Exposed: No Necrotic Quality: Adherent Slough Fat Layer (Subcutaneous Tissue) Exposed: Yes Tendon Exposed: No Muscle Exposed: No Joint Exposed: No Bone Exposed: No Periwound Skin Texture Texture Color No Abnormalities Noted: Yes No Abnormalities Noted: No Atrophie Blanche: No Moisture Cyanosis: No No Abnormalities Noted: Yes Ecchymosis: No Erythema: No Hemosiderin Staining: Yes Mottled: No Pallor: No Rubor: No Temperature / Pain Temperature: No Abnormality Treatment Notes Wound #6 (Lower Leg) Wound Laterality: Right, Anterior Cleanser Peri-Wound Care Triamcinolone 15 (g) Discharge Instruction: Use triamcinolone 15 (g) ( in clinic) Zinc Oxide Ointment 30g tube Discharge Instruction: Apply Zinc Oxide to periwound with each dressing change as needed for excoriation Sween Lotion (Moisturizing lotion) Discharge Instruction: Apply moisturizing lotion as directed JAMARR, PERSAD ([id number] Page 11 of 11 Topical Primary Dressing sodium thiosulfate ointment Discharge Instruction: to wound bed with each dressing change Secondary Dressing ABD Pad, 5x9 Discharge Instruction: Apply over primary dressing as directed. Woven Gauze Sponge, Non-Sterile 4x4 in Discharge Instruction: Apply over primary dressing as directed. Secured With Compression  Wrap FourPress (4 layer compression wrap) Discharge Instruction: Apply four layer compression as directed. May also use Urgo K2 compression system as alternative. Compression Stockings Add-Ons Electronic Signature(s) Signed: 09/15/2022 5:50:47 PM By: Baruch Gouty RN, BSN Entered By: Baruch Gouty on 09/15/2022 13:02:05 -------------------------------------------------------------------------------- Vitals Details  Patient Name: Date of Service: BRAXON, JOICE 09/15/2022 12:30 PM Medical Record Number: [id number] Patient Account Number: [id number] Date of Birth/Sex: Treating RN: [birth year] (71 y.o. [name] Primary Care Katryna Tschirhart: PA [name], [place] Other Clinician: Referring Feiga Nadel: Treating Tabria Steines/Extender: [name] in Treatment: 31 Vital Signs Time Taken: 12:39 Temperature (F): 97.6 Height (in): 71 Pulse (bpm): 67 Weight (lbs): 267 Respiratory Rate (breaths/min): 20 Body Mass Index (BMI): 37.2 Blood Pressure (mmHg): 132/78 Reference Range: 80 - 120 mg / dl Electronic Signature(s) Signed: 09/15/2022 5:50:47 PM By: Baruch Gouty RN, BSN Entered By: Baruch Gouty on 09/15/2022 12:39:53

## 2022-09-17 DIAGNOSIS — I872 Venous insufficiency (chronic) (peripheral): Secondary | ICD-10-CM | POA: Diagnosis not present

## 2022-09-17 DIAGNOSIS — L942 Calcinosis cutis: Secondary | ICD-10-CM | POA: Diagnosis not present

## 2022-09-17 DIAGNOSIS — I10 Essential (primary) hypertension: Secondary | ICD-10-CM | POA: Diagnosis not present

## 2022-09-17 DIAGNOSIS — L03116 Cellulitis of left lower limb: Secondary | ICD-10-CM | POA: Diagnosis not present

## 2022-09-17 DIAGNOSIS — L97522 Non-pressure chronic ulcer of other part of left foot with fat layer exposed: Secondary | ICD-10-CM | POA: Diagnosis not present

## 2022-09-17 DIAGNOSIS — L97812 Non-pressure chronic ulcer of other part of right lower leg with fat layer exposed: Secondary | ICD-10-CM | POA: Diagnosis not present

## 2022-09-22 ENCOUNTER — Encounter (HOSPITAL_BASED_OUTPATIENT_CLINIC_OR_DEPARTMENT_OTHER): Payer: Medicare Other | Admitting: General Surgery

## 2022-09-22 DIAGNOSIS — L97522 Non-pressure chronic ulcer of other part of left foot with fat layer exposed: Secondary | ICD-10-CM | POA: Diagnosis not present

## 2022-09-22 DIAGNOSIS — I739 Peripheral vascular disease, unspecified: Secondary | ICD-10-CM | POA: Diagnosis not present

## 2022-09-22 DIAGNOSIS — L942 Calcinosis cutis: Secondary | ICD-10-CM | POA: Diagnosis not present

## 2022-09-22 DIAGNOSIS — L97812 Non-pressure chronic ulcer of other part of right lower leg with fat layer exposed: Secondary | ICD-10-CM | POA: Diagnosis not present

## 2022-09-22 DIAGNOSIS — I872 Venous insufficiency (chronic) (peripheral): Secondary | ICD-10-CM | POA: Diagnosis not present

## 2022-09-22 DIAGNOSIS — I890 Lymphedema, not elsewhere classified: Secondary | ICD-10-CM | POA: Diagnosis not present

## 2022-09-22 DIAGNOSIS — I110 Hypertensive heart disease with heart failure: Secondary | ICD-10-CM | POA: Diagnosis not present

## 2022-09-23 NOTE — Progress Notes (Signed)
[name] ([id number]) [id number] Page 1 of 15 Visit Report for 09/22/2022 Chief Complaint Document Details Patient Name: Date of Service: [name], [name] 09/22/2022 12:30 PM Medical Record Number: [id number] Patient Account Number: [id number] Date of Birth/Sex: Treating RN: March 29, 1951 (72 y.o. M) Primary Care Provider: PA [name], NO Other Clinician: Referring Provider: Treating Provider/Extender: [name] in Treatment: 32 Information Obtained from: Patient Chief Complaint Patient seen for complaints of Non-Healing Wounds. Electronic Signature(s) Signed: 09/22/2022 12:56:30 PM By: [name] Maudlin MD FACS Entered By: [name] on 09/22/2022 12:56:30 -------------------------------------------------------------------------------- Debridement Details Patient Name: Date of Service: [name], [name] 09/22/2022 12:30 PM Medical Record Number: [id number] Patient Account Number: [id number] Date of Birth/Sex: Treating RN: [birth year] (72 y.o. [name] Primary Care Provider: PA [name], [place] Other Clinician: Referring Provider: Treating Provider/Extender: [name] in Treatment: 32 Debridement Performed for Assessment: Wound #3 Right,Medial Lower Leg Performed By: Physician [name] Maudlin, MD Debridement Type: Debridement Level of Consciousness (Pre-procedure): Awake and Alert Pre-procedure Verification/Time Out Yes - 12:50 Taken: Start Time: 12:50 Pain Control: Lidocaine 4% T opical Solution T Area Debrided (L x W): otal 3.5 (cm) x 5.3 (cm) = 18.55 (cm) Tissue and other material debrided: Viable, Non-Viable, Slough, Subcutaneous, Slough Level: Skin/Subcutaneous Tissue Debridement Description: Excisional Instrument: Curette Bleeding: Minimum Hemostasis Achieved: Pressure Procedural Pain: 3 Post Procedural Pain: 1 Response to Treatment: Procedure was tolerated well Level of Consciousness  (Post- Awake and Alert procedure): Post Debridement Measurements of Total Wound Length: (cm) 4.1 Width: (cm) 5.3 Depth: (cm) 0.7 Volume: (cm) 11.947 Character of Wound/Ulcer Post Debridement: Improved Post Procedure Diagnosis Same as Pre-procedure Notes Scribed for Dr. Celine [name] by [name] Gouty, RN Electronic Signature(s) Signed: 09/22/2022 4:05:52 PM By: [name] Gouty RN, BSN Signed: 09/22/2022 4:13:19 PM By: [name] Maudlin MD FACS Entered By: [name] on 09/22/2022 12:54:09 [name] ([id number] Page 2 of 15 -------------------------------------------------------------------------------- Debridement Details Patient Name: Date of Service: [name], [name] 09/22/2022 12:30 PM Medical Record Number: [id number] Patient Account Number: [id number] Date of Birth/Sex: Treating RN: Mar 12, 1951 (72 y.o. [name] Primary Care Provider: PA [name], [place] Other Clinician: Referring Provider: Treating Provider/Extender: [name] in Treatment: 32 Debridement Performed for Assessment: Wound #6 Right,Anterior Lower Leg Performed By: Physician [name] Maudlin, MD Debridement Type: Debridement Level of Consciousness (Pre-procedure): Awake and Alert Pre-procedure Verification/Time Out Yes - 12:50 Taken: Start Time: 12:50 Pain Control: Lidocaine 4% T opical Solution T Area Debrided (L x W): otal 0.7 (cm) x 0.7 (cm) = 0.49 (cm) Tissue and other material debrided: Viable, Non-Viable, Slough, Subcutaneous, Slough Level: Skin/Subcutaneous Tissue Debridement Description: Excisional Instrument: Curette Bleeding: Minimum Hemostasis Achieved: Pressure Procedural Pain: 3 Post Procedural Pain: 1 Response to Treatment: Procedure was tolerated well Level of Consciousness (Post- Awake and Alert procedure): Post Debridement Measurements of Total Wound Length: (cm) 0.7 Width: (cm) 0.7 Depth: (cm) 0.1 Volume: (cm)  0.038 Character of Wound/Ulcer Post Debridement: Improved Post Procedure Diagnosis Same as Pre-procedure Notes Scribed for Dr. Celine [name] by [name] Gouty, RN Electronic Signature(s) Signed: 09/22/2022 4:05:52 PM By: [name] Gouty RN, BSN Signed: 09/22/2022 4:13:19 PM By: [name] Maudlin MD FACS Entered By: [name] on 09/22/2022 12:54:40 -------------------------------------------------------------------------------- Debridement Details Patient Name: Date of Service: [name], [name] 09/22/2022 12:30 PM Medical Record Number: [id number] Patient Account Number: [id number] Date of Birth/Sex: Treating RN: 1951/05/29 (72 y.o. [name] Primary Care Provider: PA [name], [place] Other Clinician: Referring Provider: Treating Provider/Extender: [name] in Treatment: [phone]  Debridement Performed for Assessment: Wound #1 Left,Dorsal Foot Performed By: Physician [name] Maudlin, MD Debridement Type: Debridement Level of Consciousness (Pre-procedure): Awake and Alert Pre-procedure Verification/Time Out Yes - 12:50 Taken: Start Time: 12:50 Pain Control: Lidocaine 4% T opical Solution T Area Debrided (L x W): otal 0.3 (cm) x 0.3 (cm) = 0.09 (cm) Tissue and other material debrided: Non-Viable, Slough, Slough Level: Non-Viable Tissue Debridement Description: Selective/Open Wound Instrument: Curette Bleeding: Minimum [name] ([id number] Page 3 of 15 Hemostasis Achieved: Pressure Procedural Pain: 3 Post Procedural Pain: 1 Response to Treatment: Procedure was tolerated well Level of Consciousness (Post- Awake and Alert procedure): Post Debridement Measurements of Total Wound Length: (cm) 0.3 Width: (cm) 0.3 Depth: (cm) 0.1 Volume: (cm) 0.007 Character of Wound/Ulcer Post Debridement: Improved Post Procedure Diagnosis Same as Pre-procedure Notes Scribed for Dr. Celine [name] by [name] Gouty, RN Electronic  Signature(s) Signed: 09/22/2022 4:05:52 PM By: [name] Gouty RN, BSN Signed: 09/22/2022 4:13:19 PM By: [name] Maudlin MD FACS Entered By: [name] on 09/22/2022 12:55:32 -------------------------------------------------------------------------------- HPI Details Patient Name: Date of Service: [name], [name] 09/22/2022 12:30 PM Medical Record Number: [id number] Patient Account Number: [id number] Date of Birth/Sex: Treating RN: April 08, 1951 (72 y.o. M) Primary Care Provider: PA [name], NO Other Clinician: Referring Provider: Treating Provider/Extender: [name] in Treatment: 32 History of Present Illness HPI Description: ADMISSION 02/09/2022 This is a 72 year old man who recently moved to [place] from [place]. He has a history of of coronary artery disease and prior left popliteal vein DVT . He is not diabetic and does not smoke. 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 He has had multiple spinal surgeries and has limited mobility. He primarily uses a motorized wheelchair. He has had lymphedema for many years and does have lymphedema pumps although he says he does not use them frequently. He has wounds on his bilateral lower extremities. He was receiving care in [place] for these prior to his move. They were apparently packing his wounds with Betadine soaked gauze. He has worn compression wraps in the past but not recently. He did say that they helped tremendously. In clinic today, his right ABI is noncompressible but has good signals; the left ABI was 1.17. No formal vascular studies have been performed. On his left dorsal foot, there is a circular lesion that exposes the fat layer. There is fairly heavy slough accumulation within the wound, but no significant odor. On his right medial calf, he has multiple pinholes that have slough buildup in them and probe for about 2 to 4 mm in depth. They are draining clear fluid. On his right lateral  midfoot, he has ulceration consistent with venous stasis. It is geographic and has slough accumulation. 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 02/17/2022: All of the wounds are little bit larger today and have more slough accumulation. Today, the medial calf openings are larger and probe to something that feels calcified. There is odor coming from his wounds. His vascular studies are scheduled for August 9 and 10. 02-24-2022 upon evaluation today patient presents for follow-up concerning his ongoing issues here with his lower extremities. With that being said he does have significant problems with what appears to be cellulitis unfortunately the PCR culture that was sent last week got crushed by UPS in transit. With that being said we are going to reobtain a culture today although I am actually to do  it on the right medial leg as this seems to be the most inflamed area today where there is some questionable drainage as well. I am going to remove some of the calcium deposits which are loosening obtain a deeper culture from this area. Fortunately I do not see any evidence of active infection systemically which is good news but locally I think we are having a bigger issue here. He does have his appointment next Thursday with vascular. Patient has also been referred to [facility] for further evaluation and treatment of the calcinosis for possible sulfate injections. 03/04/2022: The culture that was performed last week grew out multiple species including Klebsiella, Morganella, and Pseudomonas aeruginosa. He had been prescribed Bactrim but this was not adequate to cover all of the species and levofloxacin was recommended. He completed the Bactrim but did not pick up the levofloxacin and begin taking it until yesterday. 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 He had arterial studies performed today. He does have evidence of peripheral arterial disease. Results copied here: +-------+-----------+-----------+------------+------------+ ABI/TBIT oday's ABIT oday's TBIPrevious ABIPrevious TBI +-------+-----------+-----------+------------+------------+ Right Demorest 0.75    +-------+-----------+-----------+------------+------------+ Left Turrell 0.57    +-------+-----------+-----------+------------+------------+ [name] ([id number]) [id number].pdf Page 4 of 15 Arterial wall calcification precludes accurate ankle pressures and ABIs. Summary: Right: Resting right ankle-brachial index indicates noncompressible right lower extremity arteries. The right toe-brachial index is normal. Left: Resting left ankle-brachial index indicates noncompressible left lower extremity arteries. The left toe-brachial index is abnormal. He continues to have further tissue breakdown at the right medial calf and there is ample slough accumulation along with necrotic subcutaneous tissue. The left dorsal foot wound has built up slough, as well. The right medial foot wound is nearly closed with just a light layer of eschar over the surface. The right dorsal lateral foot wound has also accumulated a fair amount of slough and remains quite tender. 03/10/2022: He has been taking the prescribed levofloxacin and has about 3 days left of this. The erythema and induration on his right leg has improved. He continues to experience further breakdown of the right medial calf wound. There is extensive slough and debris accumulation there. There is heavy slough on his left dorsal foot wound, but no odor or purulent drainage. The right medial foot wound appears to be closed. The right dorsal lateral foot wound also has a fair amount of slough but it is  less tender than at our last visit. He still does not have an appointment with dermatology. 03/22/2022: The right anterior tibial wound has closed. The right lateral foot wound is nearly closed with just a little bit of slough. The left dorsal foot wound is smaller and continues to have some slough buildup but less so than at prior visits. There is good granulation tissue underlying the slough. Unfortunately the right medial calf wound continues to deteriorate. It has a foul odor today and a lot of necrotic tissue, the surrounding skin is also erythematous and moist. 03/30/2022: The right lateral foot wound continues to contract and just has a little bit of slough and eschar present. The left dorsal foot wound is also smaller with slough overlying good granulation tissue. The right medial calf wound actually looks quite a bit better today; there is no odor and there is much less necrotic tissue although some remains present. We have been using topical gentamicin  and he has been taking oral Augmentin. 04/07/2022: The patient saw dermatology at [facility] last week. Unfortunately, it appears that they did not read any of the documentation that was provided. 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 Overall, it was a highly unsatisfactory consultation. In the meantime, however, we were able to identify compounding pharmacy who could create a topical sodium thiosulfate ointment. The patient has that with him today. Both of the right foot wounds are now closed. The left dorsal foot wound has contracted but still has a layer of slough on the surface. The medial calf wounds on the right all look a little bit better. They still have heavy slough  along with the chunks of calcium. Everything is stained purple. 04/14/2022: The wound on his dorsal left foot is a little bit smaller again today. There is still slough accumulation on the surface. The medial calf wounds have quite a bit less slough accumulation today. His right leg, however, is warm and erythematous, concerning for cellulitis. 04/14/2022: He completed his course of Augmentin yesterday. The medial calf wound sites are much cleaner today and shallower. There is less fragmented calcium in the wounds. He has a lot of lymphedema fluid-like drainage from these wounds, but no purulent discharge or malodor. The wound on his dorsal left foot has also contracted and is shallower. There is a layer of slough over healthy granulation tissue. 05/05/2022: The left dorsal foot wound is smaller today with less slough and more granulation tissue. The right medial calf wounds are shallower, but have extensive slough and some fat necrosis. The drainage has diminished considerably, however. 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 05/12/2022: The left dorsal foot wound continues to contract and fill with granulation tissue. There is slough on the wound surface. The right medial calf wounds also continue to fill with healthy tissue, but there is remaining slough and fat necrosis present. He had a significant increase in his drainage this week and it was a bright yellow-green color. He also had a new wound open over his right anterior tibial surface associated with the spike of cutaneous calcium. The leg is more red, as well, concerning for infection. 05/19/2022: His culture returned positive for Pseudomonas. He is currently taking a course of oral levofloxacin. We have also been using topical gentamicin mixed in with his sodium thiosulfate. All of the wounds look better this week. The ones  associated with his calcinosis cutis have filled in substantially. There is slough and nonviable subcutaneous tissue still present. The new anterior tibial wound just has a light layer of slough. The dorsal foot wound also has some slough present and appears a little dry today. 05/26/2022: The right medial leg wound continues to improve. It is feeling with granulation tissue, but still accumulates a fairly thick layer of slough on the surface. The more recent anterior tibial wound has remained quite small, but also has some slough on the surface. The left dorsal foot wound has contracted somewhat and has perimeter epithelialization. He completed his oral levofloxacin. 06/02/2022: The left dorsal foot wound continues to improve significantly. There is just a little slough on the  wound surface. The right medial leg wound had black drainage, but it looks like silver alginate was applied last week and I theorized that silver sulfide was created with a combination of the silver alginate and the sodium thiosulfate. It did, however, have a bit of an odor to it and extensive slough accumulation. The small anterior tibial wound also had a bit of slough and nonviable subcutaneous tissue present. The skin of his leg was rather macerated, as well. 06/09/2022: The left dorsal foot wound is smaller again this week with just a light layer of slough on the surface. The right medial leg wound looks substantially better. The leg and periwound skin are no longer red and macerated. There is good granulation tissue forming with less slough and nonviable tissue. The anterior tibial wound just has a small amount of slough accumulation. 06/16/2022: The left dorsal foot wound continues to contract. His wrap slipped a little bit, however, and his foot is more swollen. The right medial leg wound continues to improve. The underlying tissue is more vital-appearing and there is less slough. He does have substantial drainage. The  small anterior tibial wound has a bit of slough accumulation. 06/23/2022: The left dorsal foot wound is about half the size as it was last week. There is just some light slough on the surface and some eschar buildup around the edges. The right anterior leg wound is small with a little slough on the surface. Unfortunately, the right medial leg wound deteriorated fairly substantially. It is malodorous with gray/green/black discoloration. 06/30/2022: The left dorsal foot wound continues to contract. It is nearly closed with just a light layer of slough present. The right anterior leg wound is about the same size and has a small amount of slough on the surface. The right medial leg wound looks a lot better than it did last week. The odor has abated and the tissue is less pulpy and necrotic-looking. There is still a fairly thick layer of slough present. 12/13; the left dorsal foot wound looks very healthy. There was no need to change the dressing here and no debridement. He has a small area on the right anterior lower leg apparently had not changed much in size. The area on the medial leg is the most problematic area this is large with some depth and a completely nonviable surface today. 07/14/2022: The left dorsal foot wound is smaller today with just a little slough and eschar present. The right anterior lower leg wound is about the same without any significant change. The medial leg wound has a black and green surface and is malodorous. No purulent drainage and the periwound skin is intact. Edema control is suboptimal. 07/21/2022: The dorsal foot wound continues to contract and is nearly closed with just a little bit of slough and eschar on the surface. The right anterior lower leg wound is shallower today but otherwise the dimensions are unchanged. The medial leg wound looks significantly better although it still has a nonviable TU, [name] ([id number]) [id number] Page 5 of  15 surface; the odor and black/green surface has resolved. His culture came back with multiple species sensitive to Augmentin, which was prescribed and a very low level of Pseudomonas, which should be handled by the topical gentamicin we have been using. 07/28/2022: The dorsal foot wound is down to just a very tiny opening with a little eschar on the surface. The right anterior tibial wound is about the same with some slough buildup. The right medial leg wound looks quite  a bit better this week. There is thick rubbery slough on the surface, but the odor and drainage has improved significantly. 08/04/2022: The dorsal foot wound is almost healed, but the periwound skin has broken down. This appears to be secondary to moisture and adhesive from the absorbent pad. The right anterior tibial wound is shallower. The right medial leg wound continues to improve. It is smaller, still with rubbery slough on the surface. No malodor or purulent drainage. 08/11/2022: The dorsal foot wound is about the same size. It is a little bit dry with some slough accumulation. The right anterior tibial wound is also unchanged. The right medial leg wound is filling in further with good granulation tissue. Still with some slough buildup on the surface. 08/18/2022: His left leg is bright red, hot, and swollen with cellulitis. The dorsal foot wound actually looks quite good and is superficial with just a little bit of slough accumulation. The right anterior tibial wound is unchanged. The right medial leg wound has filled in with good granulation tissue but has copious amounts of drainage. 08/25/2022: His cellulitis has responded nicely to the oral antibiotics. His dorsal foot wound is smaller but has a fair amount of slough buildup. The right anterior tibial wound remains stable and unchanged. The right medial leg wound has more granulation tissue under a layer of slough, but continues to have copious drainage. The drainage is actually  causing skin irritation and threatens to result in breakdown. 09/01/2022: He continues to have profuse drainage from his wounds which is resulting in tissue maceration on both the dorsum of his left foot and the periwound distal to his medial leg wound. The dorsal foot wound is nearly healed, despite this. The medial right leg wound is filling in with good granulation tissue. We are working on getting home health assistance to change his dressings more frequently to try and control the drainage better. 09/08/2022: We have had success in controlling his drainage. The tissue maceration has improved significantly and his swelling has come down quite markedly. The medial right leg wound is much more superficial and has substantially less nonviable tissue present. The anterior tibial wound is a little bit larger, however with some nonviable tissue present. 09/15/2022: The dorsal foot wound is nearly closed. The anterior tibial wound measures slightly larger today. The medial right leg wound continues to improve quite dramatically with less nonviable tissue and more robust-looking granulation. Edema control is significantly better. 09/22/2022: The left dorsal foot wound remains open, albeit quite tiny. The anterior tibial wound is a little bit larger again today. The medial right leg wound continues to improve with an increase in robust and healthy granulation tissue and less accumulation of nonviable tissue. Electronic Signature(s) Signed: 09/22/2022 12:57:42 PM By: [name] Maudlin MD FACS Entered By: [name] on 09/22/2022 12:57:42 -------------------------------------------------------------------------------- Physical Exam Details Patient Name: Date of Service: [name], [name] 09/22/2022 12:30 PM Medical Record Number: [id number] Patient Account Number: [id number] Date of Birth/Sex: Treating RN: May 17, 1951 (72 y.o. M) Primary Care Provider: PA [name], NO Other Clinician: Referring  Provider: Treating Provider/Extender: [name], [name] in Treatment: 32 Constitutional . . . . no acute distress. Respiratory Normal work of breathing on room air. Notes 09/22/2022: The left dorsal foot wound remains open, albeit quite tiny. The anterior tibial wound is a little bit larger again today. The medial right leg wound continues to improve with an increase in robust and healthy granulation tissue and less accumulation of nonviable tissue. Electronic Signature(s) Signed:  09/22/2022 1:01:11 PM By: [name] Maudlin MD FACS Entered By: [name] on 09/22/2022 13:01:10 -------------------------------------------------------------------------------- Physician Orders Details Patient Name: Date of Service: [name], [name] 09/22/2022 12:30 PM Medical Record Number: [id number] Patient Account Number: [id number] Date of Birth/Sex: Treating RN: 1951-03-03 (72 y.o. [name] Primary Care Provider: PA [name], [place] Other Clinician: Referring Provider: Treating Provider/Extender: [name] in Treatment: [address] 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 - Dr. Celine [name] Rm 1 Return A Wednesday 3/6 @ 1230 pm Anesthetic Wound #1 Left,Dorsal Foot (In clinic) Topical Lidocaine 4% applied to wound bed Wound #3  Right,Medial Lower Leg (In clinic) Topical Lidocaine 4% applied to wound bed Wound #6 Right,Anterior Lower Leg (In clinic) Topical Lidocaine 4% applied to wound bed Bathing/ Shower/ Hygiene May shower with protection but do not get wound dressing(s) wet. Protect dressing(s) with water repellant cover (for example, large plastic bag) or a cast cover and may then take shower. Edema Control - Lymphedema / SCD / Other Lymphedema Pumps. Use Lymphedema pumps on leg(s) 2-3 times a day for 45-60 minutes. If wearing any wraps or hose, do not remove them. Continue exercising as instructed. - use 1-2 times per day over wraps Elevate legs to the level of the heart or above for 30 minutes daily and/or when sitting for 3-4 times a day throughout the day. - throughout the day Avoid standing for long periods of time. Exercise regularly Home Health No change in wound care orders this week; continue Home Health for wound care. May utilize formulary equivalent dressing for wound treatment orders unless otherwise specified. Dressing changes to be completed by [place] on Monday / Wednesday / Friday except when patient has scheduled visit at [facility]. - pt seen in clinic on Wednesday Other [place] Orders/Instructions: - [facility] Wound Treatment Wound #1 - Foot Wound Laterality: Dorsal, Left Peri-Wound Care: Zinc Oxide Ointment 30g tube 3 x Per Week/30 Days Discharge Instructions: Apply Zinc Oxide to periwound with each dressing change as needed for excoriation Peri-Wound Care: Sween Lotion (Moisturizing lotion) 3 x Per Week/30 Days Discharge Instructions: Apply moisturizing lotion as directed Prim Dressing: Sorbalgon AG Dressing, 4x4 (in/in) (Generic) 3 x Per Week/30 Days ary Discharge Instructions: or equivalent silver alginate. Apply to wound bed as instructed Secondary Dressing: ABD Pad, 5x9 (Generic) 3 x Per Week/30 Days Discharge Instructions: Apply over primary dressing as  directed. Secondary Dressing: Woven Gauze Sponge, Non-Sterile 4x4 in (Generic) 3 x Per Week/30 Days Discharge Instructions: Apply over primary dressing as directed. Compression Wrap: FourPress (4 layer compression wrap) (Generic) 3 x Per Week/30 Days Discharge Instructions: Apply four layer compression as directed. May also use Urgo K2 compression system as alternative. Wound #3 - Lower Leg Wound Laterality: Right, Medial Peri-Wound Care: Triamcinolone 15 (g) 1 x Per Week/30 Days Discharge Instructions: Use triamcinolone 15 (g) as directed Peri-Wound Care: Zinc Oxide Ointment 30g tube 1 x Per Week/30 Days Discharge Instructions: Apply Zinc Oxide to excoriated periwound with each dressing change  as needed Peri-Wound Care: Sween Lotion (Moisturizing lotion) 1 x Per Week/30 Days Discharge Instructions: Apply moisturizing lotion as directed [name], [name] ([id number]) [id number] Page 7 of 15 Prim Dressing: Santyl Ointment 1 x Per Week/30 Days ary Discharge Instructions: to wound bed mixed 1:1 with sodium thiosulfate ointment Prim Dressing: sodium thiosulfate ointment 1 x Per Week/30 Days ary Discharge Instructions: 1:1 mix with santyl to wound bed Secondary Dressing: ABD Pad, 8x10 (Generic) 1 x Per Week/30 Days Discharge Instructions: Apply over primary dressing as directed. Secondary Dressing: Woven Gauze Sponge, Non-Sterile 4x4 in (Generic) 1 x Per Week/30 Days Discharge Instructions: Apply over primary dressing as directed. Compression Wrap: FourPress (4 layer compression wrap) (Generic) 1 x Per Week/30 Days Discharge Instructions: Apply four layer compression as directed. May also use Urgo K2 compression system as alternative. Wound #6 - Lower Leg Wound Laterality: Right, Anterior Peri-Wound Care: Triamcinolone 15 (g) 3 x Per Week/30 Days Discharge Instructions: Use triamcinolone 15 (g) ( in clinic) Peri-Wound Care: Zinc Oxide Ointment 30g tube 3 x Per Week/30  Days Discharge Instructions: Apply Zinc Oxide to periwound with each dressing change as needed for excoriation Peri-Wound Care: Sween Lotion (Moisturizing lotion) 3 x Per Week/30 Days Discharge Instructions: Apply moisturizing lotion as directed Prim Dressing: sodium thiosulfate ointment 3 x Per Week/30 Days ary Discharge Instructions: to wound bed with each dressing change Secondary Dressing: ABD Pad, 5x9 (Generic) 3 x Per Week/30 Days Discharge Instructions: Apply over primary dressing as directed. Secondary Dressing: Woven Gauze Sponge, Non-Sterile 4x4 in (Generic) 3 x Per Week/30 Days Discharge Instructions: Apply over primary dressing as directed. Compression Wrap: FourPress (4 layer compression wrap) (Generic) 3 x Per Week/30 Days Discharge Instructions: Apply four layer compression as directed. May also use Urgo K2 compression system as alternative. Electronic Signature(s) Signed: 09/22/2022 4:13:19 PM By: [name] Maudlin MD FACS Entered By: [name] on 09/22/2022 13:01:39 -------------------------------------------------------------------------------- Problem List Details Patient Name: Date of Service: LEBRANDON, HUSKA [name] 09/22/2022 12:30 PM Medical Record Number: [id number] Patient Account Number: [id number] Date of Birth/Sex: Treating RN: 1950/12/04 (72 y.o. 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 02/09/2022 No Yes 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) 02/09/2022 No Yes Edler, Ayyub ([id number] Page 8 of 15 I10 Essential (primary) hypertension 02/09/2022 No Yes I25.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 02/09/2022 No Yes I89.0 Lymphedema, not elsewhere classified 02/09/2022 No Yes Z86.718 Personal history of other venous thrombosis and embolism 02/09/2022 No Yes Inactive Problems ICD-10 Code Description Active Date Inactive Date L97.512 Non-pressure chronic ulcer of other part of right foot with fat layer exposed 02/17/2022 02/17/2022 Resolved Problems Electronic Signature(s) Signed: 09/22/2022 12:56:16 PM By: [name] Maudlin MD FACS Entered By: [name] on 09/22/2022 12:56:16 -------------------------------------------------------------------------------- Progress Note Details Patient Name: Date of Service: [name] 09/22/2022 12:30 PM Medical Record Number: [id number] Patient Account Number: [id number] Date of Birth/Sex: Treating RN: 1951/06/10 (72 y.o. M) Primary Care Provider: PA [name], NO Other Clinician: Referring Provider: Treating Provider/Extender: [name] in Treatment: 32 Subjective Chief Complaint Information obtained from Patient Patient seen for complaints of Non-Healing Wounds. History of Present  Illness (HPI) ADMISSION 02/09/2022 This is a 72 year old man who recently moved to [place] from [place]. He has a history of of coronary artery disease and prior left popliteal vein DVT . He is not diabetic and does not smoke. 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 He has had multiple spinal surgeries and has limited mobility. He primarily uses a motorized wheelchair. He has had lymphedema for many years and does have lymphedema pumps although he says he does not use them frequently. He has wounds on his bilateral lower extremities. He was receiving care in [place] for these prior to his move. They were apparently packing his  wounds with Betadine soaked gauze. He has worn compression wraps in the past but not recently. He did say that they helped tremendously. In clinic today, his right ABI is noncompressible but has good signals; the left ABI was 1.17. No formal vascular studies have been performed. On his left dorsal foot, there is a circular lesion that exposes the fat layer. There is fairly heavy slough accumulation within the wound, but no significant odor. On his right medial calf, he has multiple pinholes that have slough buildup in them and probe for about 2 to 4 mm in depth. They are draining clear fluid. On his right lateral midfoot, he has ulceration consistent with venous stasis. It is geographic and has slough accumulation. 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 02/17/2022: All of the wounds are little bit larger today and have more slough accumulation. Today, the medial calf openings are larger and probe to something that feels calcified. There is odor coming from his wounds. His vascular studies are scheduled for August 9 and 10. 02-24-2022 upon evaluation today patient presents for follow-up concerning his ongoing issues here with his lower extremities. With that being said he does have significant problems with what appears to be cellulitis unfortunately the PCR culture that was sent last week got crushed by UPS in transit. With that being said we are going to reobtain a culture today although I am actually to do it on the right medial leg as this seems to be the most inflamed area today where there is some questionable drainage as well. I am going to remove some of the calcium deposits which are loosening obtain a deeper culture from this area. Fortunately I do not see any evidence of active infection systemically which is good news but locally I think we are having a bigger issue here. He does have his appointment  next Thursday with vascular. Patient has also been referred to [facility] for further evaluation and treatment of the calcinosis for possible sulfate injections. 03/04/2022: The culture that was performed last week grew out multiple species including Klebsiella, Morganella, and Pseudomonas aeruginosa. He had been [name], [name] ([id number]) [id number] Page 9 of 15 prescribed Bactrim but this was not adequate to cover all of the species and levofloxacin was recommended. He completed the Bactrim but did not pick up the levofloxacin and begin taking it until yesterday. 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 He had arterial studies performed today. He does have evidence of peripheral arterial disease. Results copied here: +-------+-----------+-----------+------------+------------+ ABI/TBIT oday's ABIT oday's TBIPrevious ABIPrevious TBI +-------+-----------+-----------+------------+------------+ Right [place] 0.75    +-------+-----------+-----------+------------+------------+ Left [place] 0.57    +-------+-----------+-----------+------------+------------+  Arterial wall calcification precludes accurate ankle pressures and ABIs. Summary: Right: Resting right ankle-brachial index indicates noncompressible right lower extremity arteries. The right toe-brachial index is normal. Left: Resting left ankle-brachial index indicates noncompressible left lower extremity arteries. The left toe-brachial index is abnormal. He continues to have further tissue breakdown at the right medial calf and there is ample slough accumulation along with necrotic subcutaneous tissue. The left dorsal foot wound has built up slough, as well. The right medial foot wound is nearly closed with just a light layer of eschar over  the surface. The right dorsal lateral foot wound has also accumulated a fair amount of slough and remains quite tender. 03/10/2022: He has been taking the prescribed levofloxacin and has about 3 days left of this. The erythema and induration on his right leg has improved. He continues to experience further breakdown of the right medial calf wound. There is extensive slough and debris accumulation there. There is heavy slough on his left dorsal foot wound, but no odor or purulent drainage. The right medial foot wound appears to be closed. The right dorsal lateral foot wound also has a fair amount of slough but it is less tender than at our last visit. He still does not have an appointment with dermatology. 03/22/2022: The right anterior tibial wound has closed. The right lateral foot wound is nearly closed with just a little bit of slough. The left dorsal foot wound is smaller and continues to have some slough buildup but less so than at prior visits. There is good granulation tissue underlying the slough. Unfortunately the right medial calf wound continues to deteriorate. It has a foul odor today and a lot of necrotic tissue, the surrounding skin is also erythematous and moist. 03/30/2022: The right lateral foot wound continues to contract and just has a little bit of slough and eschar present. The left dorsal foot wound is also smaller with slough overlying good granulation tissue. The right medial calf wound actually looks quite a bit better today; there is no odor and there is much less necrotic tissue although some remains present. We have been using topical gentamicin and he has been taking oral Augmentin. 04/07/2022: The patient saw dermatology at [facility] last week. Unfortunately, it appears that they did not read any of the documentation that was provided. 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 Overall, it was a highly unsatisfactory consultation. In the meantime, however, we were able to identify compounding pharmacy who could create a topical sodium thiosulfate ointment. The patient has that with him today. Both of the right foot wounds are now closed. The left dorsal foot wound has contracted but still has a layer of slough on the surface. The medial calf wounds on the right all look a little bit better. They still have heavy slough along with the chunks of calcium. Everything is stained purple. 04/14/2022: The wound on his dorsal left foot is a little bit smaller again today. There is still slough accumulation on the surface. The medial calf wounds have quite a bit less slough accumulation today. His right leg, however, is warm and erythematous, concerning for cellulitis. 04/14/2022: He completed his course of Augmentin yesterday. The medial calf wound sites are much cleaner today and shallower.  There is less fragmented calcium in the wounds. He has a lot of lymphedema fluid-like drainage from these wounds, but no purulent discharge or malodor. The wound on his dorsal left foot has also contracted and is shallower. There is a layer of slough over healthy granulation tissue. 05/05/2022: The left dorsal foot wound is smaller today with less slough and more granulation tissue. The right medial calf wounds are shallower, but have extensive slough and some fat necrosis. The drainage has diminished considerably, however. 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 05/12/2022: The left dorsal foot wound continues to contract and fill with granulation tissue. There is slough on the wound surface. The right  medial calf wounds also continue to fill with healthy tissue, but there is remaining slough and fat necrosis present. He had a significant increase in his drainage this week and it was a bright yellow-green color. He also had a new wound open over his right anterior tibial surface associated with the spike of cutaneous calcium. The leg is more red, as well, concerning for infection. 05/19/2022: His culture returned positive for Pseudomonas. He is currently taking a course of oral levofloxacin. We have also been using topical gentamicin mixed in with his sodium thiosulfate. All of the wounds look better this week. 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 There is slough and nonviable subcutaneous tissue still present. The new anterior tibial wound just has a light layer of slough. The dorsal foot wound also has some slough present and appears a little dry today. 05/26/2022: The right medial leg wound continues to improve. It is feeling with granulation tissue, but still accumulates a fairly thick layer of slough on the surface. The more recent anterior tibial wound has remained quite small, but also has some slough on the surface. The left dorsal foot wound has contracted somewhat and has perimeter epithelialization. He completed his oral levofloxacin. 06/02/2022: The left dorsal foot wound continues to improve significantly. There is just a little slough on the wound surface. The right medial leg wound had black drainage, but it looks like silver alginate was applied last week and I theorized that silver sulfide was created with a combination of the silver alginate and the sodium thiosulfate. It did, however, have a bit of an odor to it and extensive slough accumulation. The small anterior tibial wound also had a bit of slough and nonviable subcutaneous tissue present. The skin of his leg was rather macerated, as well. 06/09/2022: The left dorsal foot wound is smaller  again this week with just a light layer of slough on the surface. The right medial leg wound looks substantially better. The leg and periwound skin are no longer red and macerated. There is good granulation tissue forming with less slough and nonviable tissue. The anterior tibial wound just has a small amount of slough accumulation. 06/16/2022: The left dorsal foot wound continues to contract. His wrap slipped a little bit, however, and his foot is more swollen. The right medial leg wound continues to improve. The underlying tissue is more vital-appearing and there is less slough. He does have substantial drainage. The small anterior tibial wound has a bit of slough accumulation. 06/23/2022: The left dorsal foot wound is about half the size as it was last week. There is just some light slough on the surface and some eschar buildup around the edges.  The right anterior leg wound is small with a little slough on the surface. Unfortunately, the right medial leg wound deteriorated fairly substantially. It is malodorous with gray/green/black discoloration. [name], [name] ([id number]) [id number] Page 10 of 15 06/30/2022: The left dorsal foot wound continues to contract. It is nearly closed with just a light layer of slough present. The right anterior leg wound is about the same size and has a small amount of slough on the surface. The right medial leg wound looks a lot better than it did last week. The odor has abated and the tissue is less pulpy and necrotic-looking. There is still a fairly thick layer of slough present. 12/13; the left dorsal foot wound looks very healthy. There was no need to change the dressing here and no debridement. He has a small area on the right anterior lower leg apparently had not changed much in size. The area on the medial leg is the most problematic area this is large with some depth and a completely nonviable surface today. 07/14/2022: The left  dorsal foot wound is smaller today with just a little slough and eschar present. The right anterior lower leg wound is about the same without any significant change. The medial leg wound has a black and green surface and is malodorous. No purulent drainage and the periwound skin is intact. Edema control is suboptimal. 07/21/2022: The dorsal foot wound continues to contract and is nearly closed with just a little bit of slough and eschar on the surface. The right anterior lower leg wound is shallower today but otherwise the dimensions are unchanged. The medial leg wound looks significantly better although it still has a nonviable surface; the odor and black/green surface has resolved. His culture came back with multiple species sensitive to Augmentin, which was prescribed and a very low level of Pseudomonas, which should be handled by the topical gentamicin we have been using. 07/28/2022: The dorsal foot wound is down to just a very tiny opening with a little eschar on the surface. The right anterior tibial wound is about the same with some slough buildup. The right medial leg wound looks quite a bit better this week. There is thick rubbery slough on the surface, but the odor and drainage has improved significantly. 08/04/2022: The dorsal foot wound is almost healed, but the periwound skin has broken down. This appears to be secondary to moisture and adhesive from the absorbent pad. The right anterior tibial wound is shallower. The right medial leg wound continues to improve. It is smaller, still with rubbery slough on the surface. No malodor or purulent drainage. 08/11/2022: The dorsal foot wound is about the same size. It is a little bit dry with some slough accumulation. The right anterior tibial wound is also unchanged. The right medial leg wound is filling in further with good granulation tissue. Still with some slough buildup on the surface. 08/18/2022: His left leg is bright red, hot, and swollen  with cellulitis. The dorsal foot wound actually looks quite good and is superficial with just a little bit of slough accumulation. The right anterior tibial wound is unchanged. The right medial leg wound has filled in with good granulation tissue but has copious amounts of drainage. 08/25/2022: His cellulitis has responded nicely to the oral antibiotics. His dorsal foot wound is smaller but has a fair amount of slough buildup. The right anterior tibial wound remains stable and unchanged. The right medial leg wound has more granulation tissue under a layer of slough, but continues  to have copious drainage. The drainage is actually causing skin irritation and threatens to result in breakdown. 09/01/2022: He continues to have profuse drainage from his wounds which is resulting in tissue maceration on both the dorsum of his left foot and the periwound distal to his medial leg wound. The dorsal foot wound is nearly healed, despite this. The medial right leg wound is filling in with good granulation tissue. We are working on getting home health assistance to change his dressings more frequently to try and control the drainage better. 09/08/2022: We have had success in controlling his drainage. The tissue maceration has improved significantly and his swelling has come down quite markedly. The medial right leg wound is much more superficial and has substantially less nonviable tissue present. The anterior tibial wound is a little bit larger, however with some nonviable tissue present. 09/15/2022: The dorsal foot wound is nearly closed. The anterior tibial wound measures slightly larger today. The medial right leg wound continues to improve quite dramatically with less nonviable tissue and more robust-looking granulation. Edema control is significantly better. 09/22/2022: The left dorsal foot wound remains open, albeit quite tiny. The anterior tibial wound is a little bit larger again today. The medial right leg  wound continues to improve with an increase in robust and healthy granulation tissue and less accumulation of nonviable tissue. Patient History Information obtained from Patient, Chart. Family History Cancer - Mother, Heart Disease - Father,Mother, Hypertension - Father,Siblings, Lung Disease - Siblings, No family history of Diabetes, Hereditary Spherocytosis, Kidney Disease, Seizures, Stroke, Thyroid Problems, Tuberculosis. Social History Former smoker - quit 1991, Marital Status - Married, Alcohol Use - Moderate, Drug Use - No History, Caffeine Use - Daily - coffee. Medical History Eyes Patient has history of Cataracts - right eye removed Denies history of Glaucoma, Optic Neuritis Ear/Nose/Mouth/Throat Denies history of Chronic sinus problems/congestion, Middle ear problems Hematologic/Lymphatic Patient has history of Anemia - macrocytic, Lymphedema Cardiovascular Patient has history of Congestive Heart Failure, Deep Vein Thrombosis - left leg, Hypertension, Peripheral Venous Disease Endocrine Denies history of Type I Diabetes, Type II Diabetes Genitourinary Denies history of End Stage Renal Disease Integumentary (Skin) Denies history of History of Burn Oncologic Denies history of Received Chemotherapy, Received Radiation Psychiatric Denies history of Anorexia/bulimia, Confinement Anxiety Hospitalization/Surgery History - cervical fusion. - lumbar surgery. - coronary stent placement. - lasik eye surgery. - hydrocele excision. 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 Vitals Time Taken: 12:25 PM, Height: 71 in, Weight: 267  lbs, BMI: 37.2, Temperature: 98 F, Pulse: 67 bpm, Respiratory Rate: 18 breaths/min, Blood Pressure: 130/75 mmHg. Respiratory Normal work of breathing on room air. General Notes: 09/22/2022: The left dorsal foot wound remains open, albeit quite tiny. The anterior tibial wound is a little bit larger again today. The medial right leg wound continues to improve with an increase in robust and healthy granulation tissue and less accumulation of nonviable tissue. Integumentary (Hair, Skin) Wound #1 status is Open. Original cause of wound was Gradually Appeared. The date acquired was: 12/07/2021. The wound has been in treatment 32 weeks. The wound is located on the Left,Dorsal Foot. The wound measures 0.3cm length x 0.3cm width x 0.1cm depth; 0.071cm^2 area and 0.007cm^3 volume. There is Fat Layer (Subcutaneous Tissue) exposed. There is no tunneling or  undermining noted. There is a medium amount of serous drainage noted. The wound margin is distinct with the outline attached to the wound base. There is medium (34-66%) pink granulation within the wound bed. There is a medium (34-66%) amount of necrotic tissue within the wound bed including Adherent Slough. The periwound skin appearance exhibited: Dry/Scaly, Hemosiderin Staining. The periwound skin appearance did not exhibit: Excoriation, Maceration, Rubor, Erythema. Periwound temperature was noted as No Abnormality. The periwound has tenderness on palpation. Wound #3 status is Open. Original cause of wound was Gradually Appeared. The date acquired was: 12/07/2021. The wound has been in treatment 32 weeks. The wound is located on the Right,Medial Lower Leg. The wound measures 4.1cm length x 5.3cm width x 0.7cm depth; 17.067cm^2 area and 11.947cm^3 volume. There is Fat Layer (Subcutaneous Tissue) exposed. There is no tunneling or undermining noted. There is a medium amount of serosanguineous drainage noted. The wound margin is distinct with the outline attached  to the wound base. There is medium (34-66%) red granulation within the wound bed. There is a medium (34-66%) amount of necrotic tissue within the wound bed including Adherent Slough. The periwound skin appearance exhibited: Excoriation, Hemosiderin Staining. The periwound skin appearance did not exhibit: Dry/Scaly, Maceration, Rubor, Erythema. Periwound temperature was noted as No Abnormality. The periwound has tenderness on palpation. Wound #6 status is Open. Original cause of wound was Gradually Appeared. The date acquired was: 05/12/2022. The wound has been in treatment 19 weeks. The wound is located on the Right,Anterior Lower Leg. The wound measures 0.7cm length x 0.7cm width x 0.2cm depth; 0.385cm^2 area and 0.077cm^3 volume. There is Fat Layer (Subcutaneous Tissue) exposed. There is no tunneling or undermining noted. There is a medium amount of serosanguineous drainage noted. The wound margin is epibole. There is medium (34-66%) red granulation within the wound bed. There is a medium (34-66%) amount of necrotic tissue within the wound bed including Adherent Slough. The periwound skin appearance had no abnormalities noted for texture. The periwound skin appearance had no abnormalities noted for moisture. The periwound skin appearance exhibited: Hemosiderin Staining. The periwound skin appearance did not exhibit: Atrophie Blanche, Cyanosis, Ecchymosis, Mottled, Pallor, Rubor, Erythema. Periwound temperature was noted as No Abnormality. The periwound has tenderness on palpation. 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 There was a Selective/Open Wound Non-Viable Tissue Debridement with a total area of 0.09 sq cm performed by [name] Maudlin, MD. With the following instrument(s): Curette to remove Non-Viable tissue/material. Material removed includes [facility] after achieving pain control using Lidocaine 4% Topical Solution. No specimens were taken. A time out was conducted at 12:50, prior to the start of the procedure. A Minimum amount of bleeding was controlled with Pressure. The procedure was tolerated well with a pain level of 3 throughout and a pain level of 1 following the procedure. Post Debridement Measurements: 0.3cm length x 0.3cm width x 0.1cm depth; 0.007cm^3 volume. Character of Wound/Ulcer Post Debridement is improved. BERDELL, PETRELLA ([id number]) [id number] Page 12 of 15 Post procedure Diagnosis Wound #1: Same as Pre-Procedure General Notes: Scribed for Dr. Celine [name] by  [name] Gouty, RN. Pre-procedure diagnosis of Wound #1 is a Lymphedema located on the Left,Dorsal Foot . There was a Four Layer Compression Therapy Procedure by [name] Gouty, RN. Post procedure Diagnosis Wound #1: Same as Pre-Procedure Wound #3 Pre-procedure diagnosis of Wound #3 is a Lymphedema located on the Right,Medial Lower Leg . There was a Excisional Skin/Subcutaneous Tissue Debridement with a total area of 18.55 sq cm performed by [name] Maudlin, MD. With the following instrument(s): Curette to remove Viable and Non-Viable tissue/material. Material removed includes Subcutaneous Tissue and Slough and after achieving pain control using Lidocaine 4% T opical Solution. No specimens were taken. A time out was conducted at 12:50, prior to the start of the procedure. A Minimum amount of bleeding was controlled with Pressure. The procedure was tolerated  well with a pain level of 3 throughout and a pain level of 1 following the procedure. Post Debridement Measurements: 4.1cm length x 5.3cm width x 0.7cm depth; 11.947cm^3 volume. Character of Wound/Ulcer Post Debridement is improved. Post procedure Diagnosis Wound #3: Same as Pre-Procedure General Notes: Scribed for Dr. Celine [name] by [name] Gouty, RN. Pre-procedure diagnosis of Wound #3 is a Lymphedema located on the Right,Medial Lower Leg . There was a Four Layer Compression Therapy Procedure by [name] Gouty, RN. 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 There was a Excisional Skin/Subcutaneous Tissue Debridement with a total area of 0.49 sq cm performed by [name] Maudlin, MD. With the following instrument(s): Curette to remove Viable and Non-Viable tissue/material. Material removed includes Subcutaneous Tissue and Slough and after achieving pain control using Lidocaine 4% T opical Solution. No specimens were taken. A time out was conducted at 12:50, prior to the start of the procedure. A Minimum amount of bleeding was controlled with Pressure. The procedure was tolerated well with a pain level of 3 throughout and a pain level of 1 following the procedure. Post Debridement Measurements: 0.7cm length x 0.7cm width x 0.1cm depth; 0.038cm^3 volume. Character of Wound/Ulcer Post Debridement is improved. Post procedure Diagnosis Wound #6: Same as Pre-Procedure General Notes: Scribed for Dr. Celine [name] by [name] Gouty, RN. Plan Follow-up Appointments: Return Appointment in 1 week. - Dr. Celine [name] Rm 1 Wednesday 3/6 @ 1230 pm Anesthetic: Wound #1 Left,Dorsal Foot: (In clinic) Topical Lidocaine 4% applied to wound bed Wound #3 Right,Medial Lower Leg: (In clinic) Topical Lidocaine 4% applied to wound bed Wound #6 Right,Anterior Lower Leg: (In clinic) Topical Lidocaine 4% applied to  wound bed Bathing/ Shower/ Hygiene: May shower with protection but do not get wound dressing(s) wet. Protect dressing(s) with water repellant cover (for example, large plastic bag) or a cast cover and may then take shower. Edema Control - Lymphedema / SCD / Other: Lymphedema Pumps. Use Lymphedema pumps on leg(s) 2-3 times a day for 45-60 minutes. If wearing any wraps or hose, do not remove them. Continue exercising as instructed. - use 1-2 times per day over wraps Elevate legs to the level of the heart or above for 30 minutes daily and/or when sitting for 3-4 times a day throughout the day. - throughout the day Avoid standing for long periods of time. Exercise regularly Home Health: No change in wound care orders this week; continue Home Health for wound care. May utilize formulary equivalent dressing for wound treatment orders unless otherwise specified. Dressing changes to be completed by Home Health on Monday / Wednesday / Friday except when patient has  scheduled visit at [facility]. - pt seen in clinic on Wednesday Other [place] #1: - Foot Wound Laterality: Dorsal, Left Peri-Wound Care: Zinc Oxide Ointment 30g tube 3 x Per Week/30 Days Discharge Instructions: Apply Zinc Oxide to periwound with each dressing change as needed for excoriation Peri-Wound Care: Sween Lotion (Moisturizing lotion) 3 x Per Week/30 Days Discharge Instructions: Apply moisturizing lotion as directed Prim Dressing: Sorbalgon AG Dressing, 4x4 (in/in) (Generic) 3 x Per Week/30 Days ary Discharge Instructions: or equivalent silver alginate. Apply to wound bed as instructed Secondary Dressing: ABD Pad, 5x9 (Generic) 3 x Per Week/30 Days Discharge Instructions: Apply over primary dressing as directed. Secondary Dressing: Woven Gauze Sponge, Non-Sterile 4x4 in (Generic) 3 x Per Week/30 Days Discharge Instructions: Apply over primary dressing as directed. Com pression  Wrap: FourPress (4 layer compression wrap) (Generic) 3 x Per Week/30 Days Discharge Instructions: Apply four layer compression as directed. May also use Urgo K2 compression system as alternative. WOUND #3: - Lower Leg Wound Laterality: Right, Medial Peri-Wound Care: Triamcinolone 15 (g) 1 x Per Week/30 Days Discharge Instructions: Use triamcinolone 15 (g) as directed Peri-Wound Care: Zinc Oxide Ointment 30g tube 1 x Per Week/30 Days Discharge Instructions: Apply Zinc Oxide to excoriated periwound with each dressing change as needed Peri-Wound Care: Sween Lotion (Moisturizing lotion) 1 x Per Week/30 Days Discharge Instructions: Apply moisturizing lotion as directed Prim Dressing: Santyl Ointment 1 x Per Week/30 Days ary Discharge Instructions: to wound bed mixed 1:1 with sodium thiosulfate ointment Prim Dressing: sodium thiosulfate ointment 1 x Per Week/30 Days ary Discharge Instructions: 1:1 mix with santyl to wound bed Secondary Dressing: ABD Pad, 8x10 (Generic) 1 x Per Week/30 Days Discharge Instructions: Apply over primary dressing as directed. LULA, PORTOCARRERO ([id number]) [id number] Page 13 of 15 Secondary Dressing: Woven Gauze Sponge, Non-Sterile 4x4 in (Generic) 1 x Per Week/30 Days Discharge Instructions: Apply over primary dressing as directed. Com pression Wrap: FourPress (4 layer compression wrap) (Generic) 1 x Per Week/30 Days Discharge Instructions: Apply four layer compression as directed. May also use Urgo K2 compression system as alternative. WOUND #6: - Lower Leg Wound Laterality: Right, Anterior Peri-Wound Care: Triamcinolone 15 (g) 3 x Per Week/30 Days Discharge Instructions: Use triamcinolone 15 (g) ( in clinic) Peri-Wound Care: Zinc Oxide Ointment 30g tube 3 x Per Week/30 Days Discharge Instructions: Apply Zinc Oxide to periwound with each dressing change as needed for excoriation Peri-Wound Care: Sween Lotion (Moisturizing lotion) 3 x Per  Week/30 Days Discharge Instructions: Apply moisturizing lotion as directed Prim Dressing: sodium thiosulfate ointment 3 x Per Week/30 Days ary Discharge Instructions: to wound bed with each dressing change Secondary Dressing: ABD Pad, 5x9 (Generic) 3 x Per Week/30 Days Discharge Instructions: Apply over primary dressing as directed. Secondary Dressing: Woven Gauze Sponge, Non-Sterile 4x4 in (Generic) 3 x Per Week/30 Days Discharge Instructions: Apply over primary dressing as directed. Com pression Wrap: FourPress (4 layer compression wrap) (Generic) 3 x Per Week/30 Days Discharge Instructions: Apply four layer compression as directed. May also use Urgo K2 compression system as alternative. 09/22/2022: The left dorsal foot wound remains open, albeit quite tiny. The anterior tibial wound is a little bit larger again today. The medial right leg wound continues to improve with an increase in robust and healthy granulation tissue and less accumulation of nonviable tissue. I used a curette to debride slough from the left dorsal foot wound. I debrided slough and subcutaneous tissue from both right sided leg wounds. We  will continue sodium thiosulfate to the anterior leg wound, sent in ferrous sulfate mixed with Santyl to the medial right leg wound, and silver alginate to the left dorsal foot wound. Bilateral 4-layer compression. Follow-up in 1 week. Electronic Signature(s) Signed: 09/22/2022 1:02:38 PM By: [name] Maudlin MD FACS Entered By: [name] on 09/22/2022 13:02:38 -------------------------------------------------------------------------------- HxROS Details Patient Name: Date of Service: BAYRON, CARAVALHO [name] 09/22/2022 12:30 PM Medical Record Number: [id number] Patient Account Number: [id number] Date of Birth/Sex: Treating RN: Aug 29, 1950 (72 y.o. M) Primary Care Provider: PA [name], NO Other Clinician: Referring Provider: Treating Provider/Extender: [name] in  Treatment: 32 Information Obtained From Patient Chart Constitutional Symptoms (General Health) Medical History: Past Medical History Notes: obesity Eyes Medical History: Positive for: Cataracts - right eye removed Negative for: Glaucoma; Optic Neuritis Ear/Nose/Mouth/Throat Medical History: Negative for: Chronic sinus problems/congestion; Middle ear problems Past Medical History Notes: hard of hearing Hematologic/Lymphatic Medical History: Positive for: Anemia - macrocytic; Lymphedema Respiratory Medical History: Past Medical History Notes: frozen diaphragm right VONTRELL, STENDAHL ([id number]) [phone].pdf Page 14 of 15 Cardiovascular Medical History: Positive for: Congestive Heart Failure; Deep Vein Thrombosis - left leg; Hypertension; Peripheral Venous Disease Past Medical History Notes: hyperlipidemia Endocrine Medical History: Negative for: Type I Diabetes; Type II Diabetes Genitourinary Medical History: Negative for: End Stage Renal Disease Integumentary (Skin) Medical History: Negative for: History of Burn Musculoskeletal Medical History: Past Medical History Notes: myositis, lumbar DDD, spinal stenosis, cervical facet joint syndrome Oncologic Medical History: Negative for: Received Chemotherapy; Received Radiation Psychiatric Medical History: Negative for: Anorexia/bulimia; Confinement Anxiety HBO Extended History Items Eyes: Cataracts Immunizations Pneumococcal Vaccine: Received Pneumococcal Vaccination: Yes Received Pneumococcal Vaccination On or After 60th Birthday: Yes Implantable Devices No devices added Hospitalization / Surgery History Type of Hospitalization/Surgery cervical fusion lumbar surgery coronary stent placement lasik eye surgery hydrocele excision Family and Social History Cancer: Yes - Mother; Diabetes: No; Heart Disease: Yes - Father,Mother; Hereditary Spherocytosis: No; Hypertension: Yes -  Father,Siblings; Kidney Disease: No; Lung Disease: Yes - Siblings; Seizures: No; Stroke: No; Thyroid Problems: No; Tuberculosis: No; Former smoker - quit 1991; Marital Status - Married; Alcohol Use: Moderate; Drug Use: No History; Caffeine Use: Daily - coffee; Financial Concerns: No; Food, Clothing or Shelter Needs: No; Support System Lacking: No; Transportation Concerns: No Electronic Signature(s) Signed: 09/22/2022 4:13:19 PM By: [name] Maudlin MD FACS Entered By: [name] on 09/22/2022 13:00:46 -------------------------------------------------------------------------------- SuperBill Details Patient Name: Date of Service: Fare, [name] 09/22/2022 Medical Record Number: [id number] Patient Account Number: [id number] CORINTHIAN, FAYNE ([id number]) [id number] Page 15 of 15 Date of Birth/Sex: Treating RN: 1951-03-24 (72 y.o. M) Primary Care Provider: PA [name], NO Other Clinician: Referring Provider: Treating Provider/Extender: [name] in Treatment: 32 Diagnosis Coding ICD-10 Codes Code Description [phone] Non-pressure chronic ulcer of other part of right lower leg with fat layer exposed L97.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) I10 Essential (primary) hypertension I25.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 I89.0 Lymphedema, not elsewhere classified Z86.718 Personal history of other venous thrombosis and embolism Facility Procedures : CPT4 Code: [id number] Description: 11042 - DEB SUBQ TISSUE 20 SQ CM/< ICD-10 Diagnosis Description 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 Modifier: ed Quantity: 1 : CPT4 Code: [id number] Description: [id number] - DEBRIDE WOUND 1ST 20 SQ CM OR < ICD-10 Diagnosis Description  L97.522 Non-pressure  chronic ulcer of other part of left foot with fat layer exposed Modifier: Quantity: 1 Physician Procedures : CPT4 Code Description Modifier [id number] - WC PHYS LEVEL 4 - EST PT 25 ICD-10 Diagnosis Description [id number] Non-pressure chronic ulcer of other part of right lower leg with fat layer exposed L97.522 Non-pressure chronic ulcer of other part of left  foot with fat layer exposed 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) Quantity: 1 : [id number] 11042 - WC PHYS SUBQ TISS 20 SQ CM ICD-10 Diagnosis Description 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 Quantity: 1 : [id number] - WC PHYS DEBR WO ANESTH 20 SQ CM ICD-10 Diagnosis Description L97.522 Non-pressure chronic ulcer of other part of left foot with fat layer exposed Quantity: 1 Electronic Signature(s) Signed: 09/22/2022 1:03:06 PM By: [name] Maudlin MD FACS Entered By: [name] on 09/22/2022 13:03:05

## 2022-09-23 NOTE — Progress Notes (Signed)
[name] ([id number]) [id number] Page 1 of 11 Visit Report for 09/22/2022 Arrival Information Details Patient Name: Date of Service: [name], [name] 09/22/2022 12:30 PM Medical Record Number: [id number] Patient Account Number: [id number] Date of Birth/Sex: Treating RN: Feb 24, 1951 (72 y.o. [name] Primary Care [name]: PA [name], [place] Other Clinician: Referring [name]: Treating [name]/Extender: [name] in Treatment: 70 Visit Information History Since Last Visit Added or deleted any medications: No Patient Arrived: Wheel Chair Any new allergies or adverse reactions: No Arrival Time: 12:24 Had a fall or experienced change in No Accompanied By: self activities of daily living that may affect Transfer Assistance: None risk of falls: Patient Identification Verified: Yes Signs or symptoms of abuse/neglect since last visito No Secondary Verification Process Completed: Yes Hospitalized since last visit: No Patient Requires Transmission-Based Precautions: No Implantable device outside of the clinic excluding No Patient Has Alerts: Yes cellular tissue based products placed in the center Patient Alerts: R ABI= N/C, TBI= .75 since last visit: L ABI =N/C, TBI = .57 Has Dressing in Place as Prescribed: Yes Has Compression in Place as Prescribed: Yes Pain Present Now: No Electronic Signature(s) Signed: 09/22/2022 4:05:52 PM By: Baruch Gouty RN, BSN Entered By: Baruch Gouty on 09/22/2022 12:24:59 -------------------------------------------------------------------------------- Compression Therapy Details Patient Name: Date of Service: [name], [name] 09/22/2022 12:30 PM Medical Record Number: [id number] Patient Account Number: [id number] Date of Birth/Sex: Treating RN: [birth year] (72 y.o. [name] Primary Care [name]: PA [name], [place] Other Clinician: Referring [name]: Treating [name]/Extender: [name] in Treatment: 32 Compression Therapy Performed for Wound Assessment: Wound #1 Left,Dorsal Foot Performed By: Clinician Baruch Gouty, RN Compression Type: Four Layer Post Procedure Diagnosis Same as Pre-procedure Electronic Signature(s) Signed: 09/22/2022 4:05:52 PM By: Baruch Gouty RN, BSN Entered By: Baruch Gouty on 09/22/2022 12:51:29 -------------------------------------------------------------------------------- Compression Therapy Details Patient Name: Date of Service: [name], [name] 09/22/2022 12:30 PM Medical Record Number: [id number] Patient Account Number: [id number] Date of Birth/Sex: Treating RN: 09-30-1950 (72 y.o. [name] Primary Care [name]: PA [name], [place] Other Clinician: Referring Carry Weesner: Treating Camrin Gearheart/Extender: [name] in Treatment: 32 Compression Therapy Performed for Wound Assessment: Wound #3 Right,Medial Lower Leg Performed By: Clinician Baruch Gouty, RN Compression Type: Four Layer Post Procedure Diagnosis Same as [name] ([id number].pdf Page 2 of 11 Electronic Signature(s) Signed: 09/22/2022 4:05:52 PM By: Baruch Gouty RN, BSN Entered By: Baruch Gouty on 09/22/2022 12:51:29 -------------------------------------------------------------------------------- Encounter Discharge Information Details Patient Name: Date of Service: [name], [name] 09/22/2022 12:30 PM Medical Record Number: [id number] Patient Account Number: [id number] Date of Birth/Sex: Treating RN: 09/21/1950 (72 y.o. [name] Primary Care [name]: PA [name], [place] Other Clinician: Referring [name]: Treating [name]/Extender: [name] in Treatment: 32 Encounter Discharge Information Items Post Procedure Vitals Discharge Condition: Stable Temperature (F): 98 Ambulatory Status: Wheelchair Pulse (bpm): 67 Discharge  Destination: Home Respiratory Rate (breaths/min): 18 Transportation: Private Auto Blood Pressure (mmHg): 130/75 Accompanied By: self Schedule Follow-up Appointment: Yes Clinical Summary of Care: Patient Declined Electronic Signature(s) Signed: 09/22/2022 4:05:52 PM By: Baruch Gouty RN, BSN Entered By: Baruch Gouty on 09/22/2022 13:20:16 -------------------------------------------------------------------------------- Lower Extremity Assessment Details Patient Name: Date of Service: [name], [name] 09/22/2022 12:30 PM Medical Record Number: [id number] Patient Account Number: [id number] Date of Birth/Sex: Treating RN: 10-30-1950 (72 y.o. [name] Primary Care Kennett Symes: PA [name], [place] Other Clinician: Referring Gillie Fleites: Treating [name]/Extender: [name] in Treatment: 32 Edema Assessment Assessed: [Left:  No] [Right: No] Edema: [Left: Yes] [Right: Yes] Calf Left: Right: Point of Measurement: From Medial Instep 39 cm 42 cm Ankle Left: Right: Point of Measurement: From Medial Instep 23.7 cm 25 cm Vascular Assessment Pulses: Dorsalis Pedis Palpable: [Left:Yes] [Right:Yes] Electronic Signature(s) Signed: 09/22/2022 4:05:52 PM By: Baruch Gouty RN, BSN Entered By: Baruch Gouty on 09/22/2022 12:34:01 Multi Wound Chart Details -------------------------------------------------------------------------------- [name] ([id number] Page 3 of 11 Patient Name: Date of Service: [name], [name] 09/22/2022 12:30 PM Medical Record Number: [id number] Patient Account Number: [id number] Date of Birth/Sex: Treating RN: 03/11/1951 (72 y.o. M) Primary Care [name]: PA [name], NO Other Clinician: Referring [name]: Treating [name]/Extender: [name], [name] in Treatment: 32 Vital Signs Height(in): 71 Pulse(bpm): 87 Weight(lbs): 70 Blood Pressure(mmHg): 130/75 Body Mass Index(BMI):  37.2 Temperature(F): 98 Respiratory Rate(breaths/min): 18 Wound Assessments Wound Number: '1 3 6 '$ Photos: Left, Dorsal Foot Right, Medial Lower Leg Right, Anterior Lower Leg Wound Location: Gradually Appeared Gradually Appeared Gradually Appeared Wounding Event: Lymphedema Lymphedema Calcinosis Primary Etiology: Cataracts, Anemia, Lymphedema, Cataracts, Anemia, Lymphedema, Cataracts, Anemia, Lymphedema, Comorbid History: Congestive Heart Failure, Deep Vein Congestive Heart Failure, Deep Vein Congestive Heart Failure, Deep Vein Thrombosis, Hypertension, Peripheral Thrombosis, Hypertension, Peripheral Thrombosis, Hypertension, Peripheral Venous Disease Venous Disease Venous Disease 12/07/2021 12/07/2021 05/12/2022 Date Acquired: 32 32 19 [name] of Treatment: Open Open Open Wound Status: No No No Wound Recurrence: 0.3x0.3x0.1 4.1x5.3x0.7 0.7x0.7x0.2 Measurements L x W x D (cm) 0.071 17.067 0.385 A (cm) : rea 0.007 11.947 0.077 Volume (cm) : 98.10% -219.50% -63.10% % Reduction in A rea: 99.50% -459.30% -220.80% % Reduction in Volume: Full Thickness Without Exposed Full Thickness With Exposed Support Full Thickness Without Exposed Classification: Support Structures Structures Support Structures Medium Medium Medium Exudate A mount: Serous Serosanguineous Serosanguineous Exudate Type: amber red, brown red, brown Exudate Color: Distinct, outline attached Distinct, outline attached Epibole Wound Margin: Medium (34-66%) Medium (34-66%) Medium (34-66%) Granulation A mount: Pink Red Red Granulation Quality: Medium (34-66%) Medium (34-66%) Medium (34-66%) Necrotic A mount: Fat Layer (Subcutaneous Tissue): Yes Fat Layer (Subcutaneous Tissue): Yes Fat Layer (Subcutaneous Tissue): Yes Exposed Structures: Fascia: No Fascia: No Fascia: No Tendon: No Tendon: No Tendon: No Muscle: No Muscle: No Muscle: No Joint: No Joint: No Joint: No Bone: No Bone: No Bone:  No Medium (34-66%) Small (1-33%) None Epithelialization: Debridement - Selective/Open Wound Debridement - Excisional Debridement - Excisional Debridement: Pre-procedure Verification/Time Out 12:50 12:50 12:50 Taken: Lidocaine 4% Topical Solution Lidocaine 4% Topical Solution Lidocaine 4% Topical Solution Pain Control: Slough Subcutaneous, Slough Subcutaneous, Slough Tissue Debrided: Non-Viable Tissue Skin/Subcutaneous Tissue Skin/Subcutaneous Tissue Level: 0.09 18.55 0.49 Debridement A (sq cm): rea Curette Curette Curette Instrument: Minimum Minimum Minimum Bleeding: Pressure Pressure Pressure Hemostasis A chieved: '3 3 3 '$ Procedural Pain: '1 1 1 '$ Post Procedural Pain: Procedure was tolerated well Procedure was tolerated well Procedure was tolerated well Debridement Treatment Response: 0.3x0.3x0.1 4.1x5.3x0.7 0.7x0.7x0.1 Post Debridement Measurements L x W x D (cm) 0.007 11.947 0.038 Post Debridement Volume: (cm) Excoriation: No Excoriation: Yes Excoriation: No Periwound Skin Texture: Induration: No Callus: No Crepitus: No Rash: No Scarring: No Dry/Scaly: Yes Maceration: No Maceration: No Periwound Skin Moisture: Maceration: No Dry/Scaly: No Dry/Scaly: No Hemosiderin Staining: Yes Hemosiderin Staining: Yes Hemosiderin Staining: Yes Periwound Skin ColorCAYDON, BALA ([id number] Page 4 of 11 Erythema: No Erythema: No Atrophie Blanche: No Rubor: No Rubor: No Cyanosis: No Ecchymosis: No Erythema: No Mottled: No Pallor: No Rubor: No No Abnormality No Abnormality No Abnormality Temperature: Yes Yes Yes Tenderness  on Palpation: Compression Therapy Compression Therapy Debridement Procedures Performed: Debridement Debridement Treatment Notes Electronic Signature(s) Signed: 09/22/2022 12:56:24 PM By: Fredirick Maudlin MD FACS Entered By: Fredirick Maudlin on 09/22/2022  12:56:24 -------------------------------------------------------------------------------- Multi-Disciplinary Care Plan Details Patient Name: Date of Service: [name], [name] 09/22/2022 12:30 PM Medical Record Number: [id number] Patient Account Number: [id number] Date of Birth/Sex: Treating RN: Dec 16, 1950 (72 y.o. [name] Primary Care Blain Hunsucker: PA [name], [place] Other Clinician: Referring Esthefany Herrig: Treating Jahzier Villalon/Extender: [name] in Treatment: 59 Multidisciplinary Care Plan reviewed with physician Active Inactive Venous Leg Ulcer Nursing Diagnoses: Knowledge deficit related to disease process and management Potential for venous Insuffiency (use before diagnosis confirmed) Goals: Patient will maintain optimal edema control Date Initiated: 02/17/2022 Target Resolution Date: 09/29/2022 Goal Status: Active Interventions: Assess peripheral edema status every visit. Compression as ordered Provide education on venous insufficiency Treatment Activities: Non-invasive vascular studies : 02/17/2022 Therapeutic compression applied : 02/17/2022 Notes: Wound/Skin Impairment Nursing Diagnoses: Impaired tissue integrity Knowledge deficit related to ulceration/compromised skin integrity Goals: Patient/caregiver will verbalize understanding of skin care regimen Date Initiated: 02/17/2022 Target Resolution Date: 09/29/2022 Goal Status: Active Ulcer/skin breakdown will have a volume reduction of 30% by week 4 Date Initiated: 02/17/2022 Date Inactivated: 03/10/2022 Target Resolution Date: 03/10/2022 Unmet Reason: infection being treated, Goal Status: Unmet waiting on derm appte Ulcer/skin breakdown will have a volume reduction of 50% by week 8 Date Initiated: 03/10/2022 Date Inactivated: 04/14/2022 Target Resolution Date: 04/07/2022 Goal Status: Unmet Unmet Reason: calcinosis Ulcer/skin breakdown will have a volume reduction of 80% by week 12 Date Initiated:  04/14/2022 Date Inactivated: 05/05/2022 Target Resolution Date: 05/05/2022 EARNIE, STALL ([id number]) [phone].pdf Page 5 of 11 Unmet Reason: infection, chronic Goal Status: Unmet condition Interventions: Assess patient/caregiver ability to obtain necessary supplies Assess patient/caregiver ability to perform ulcer/skin care regimen upon admission and as needed Assess ulceration(s) every visit Provide education on ulcer and skin care Treatment Activities: Skin care regimen initiated : 02/17/2022 Topical wound management initiated : 02/17/2022 Notes: Electronic Signature(s) Signed: 09/22/2022 4:05:52 PM By: Baruch Gouty RN, BSN Entered By: Baruch Gouty on 09/22/2022 12:47:45 -------------------------------------------------------------------------------- Pain Assessment Details Patient Name: Date of Service: [name], [name] 09/22/2022 12:30 PM Medical Record Number: [id number] Patient Account Number: [id number] Date of Birth/Sex: Treating RN: 1951-03-25 (72 y.o. [name] Primary Care Glynn Freas: PA [name], [place] Other Clinician: Referring Cianni Manny: Treating Dawnell Bryant/Extender: [name] in Treatment: 32 Active Problems Location of Pain Severity and Description of Pain Patient Has Paino Yes Site Locations Pain Location: Pain in Ulcers With Dressing Change: Yes Duration of the Pain. Constant / Intermittento Intermittent Rate the pain. Current Pain Level: 0 Worst Pain Level: 7 Least Pain Level: 0 Character of Pain Describe the Pain: Aching Pain Management and Medication Current Pain Management: Other: loosened wrap Is the Current Pain Management Adequate: Adequate How does your wound impact your activities of daily livingo Sleep: Yes Bathing: No Appetite: No Relationship With Others: No Bladder Continence: No Emotions: No Bowel Continence: No Work: No Toileting: No Drive: No Dressing: No Hobbies: No Electronic  Signature(s) Signed: 09/22/2022 4:05:52 PM By: Baruch Gouty RN, BSN Entered By: Baruch Gouty on 09/22/2022 12:26:29 [name] ([id number].pdf Page 6 of 11 -------------------------------------------------------------------------------- Patient/Caregiver Education Details Patient Name: Date of Service: [name], [name] 2/28/2024andnbsp12:30 PM Medical Record Number: [id number] Patient Account Number: [id number] Date of Birth/Gender: Treating RN: 07-14-1951 (72 y.o. [name] Primary Care Physician: PA [name], [place] Other Clinician: Referring Physician: Treating Physician/Extender: [name]  [name] in Treatment: 32 Education Assessment Education Provided To: Patient Education Topics Provided Venous: Methods: Explain/Verbal Responses: Reinforcements needed, State content correctly Electronic Signature(s) Signed: 09/22/2022 4:05:52 PM By: Baruch Gouty RN, BSN Entered By: Baruch Gouty on 09/22/2022 12:48:02 -------------------------------------------------------------------------------- Wound Assessment Details Patient Name: Date of Service: [name], [name] 09/22/2022 12:30 PM Medical Record Number: [id number] Patient Account Number: [id number] Date of Birth/Sex: Treating RN: 1951/05/31 (72 y.o. [name] Primary Care Anamaria Dusenbury: PA [name], [place] Other Clinician: Referring Natnael Biederman: Treating Sarthak Rubenstein/Extender: [name] in Treatment: 32 Wound Status Wound Number: 1 Primary Lymphedema Etiology: Wound Location: Left, Dorsal Foot Wound Open Wounding Event: Gradually Appeared Status: Date Acquired: 12/07/2021 Comorbid Cataracts, Anemia, Lymphedema, Congestive Heart Failure, Deep [name] Of Treatment: 32 History: Vein Thrombosis, Hypertension, Peripheral Venous Disease Clustered Wound: No Photos Wound Measurements Length: (cm) 0.3 Width: (cm) 0.3 Depth: (cm) 0.1 Area: (cm)  0.071 Volume: (cm) 0.007 % Reduction in Area: 98.1% % Reduction in Volume: 99.5% Epithelialization: Medium (34-66%) Tunneling: No Undermining: No Wound Description Classification: Full Thickness Without Exposed Support Structures Wound Margin: Distinct, outline attached Kaas, Lamin ([id number]) Exudate Amount: Medium Exudate Type: Serous Exudate Color: amber Foul Odor After Cleansing: No Slough/Fibrino Yes [id number] Page 7 of 11 Wound Bed Granulation Amount: Medium (34-66%) Exposed Structure Granulation Quality: Pink Fascia Exposed: No Necrotic Amount: Medium (34-66%) Fat Layer (Subcutaneous Tissue) Exposed: Yes Necrotic Quality: Adherent Slough Tendon Exposed: No Muscle Exposed: No Joint Exposed: No Bone Exposed: No Periwound Skin Texture Texture Color No Abnormalities Noted: No No Abnormalities Noted: No Excoriation: No Erythema: No Hemosiderin Staining: Yes Moisture Rubor: No No Abnormalities Noted: No Dry / Scaly: Yes Temperature / Pain Maceration: No Temperature: No Abnormality Tenderness on Palpation: Yes Treatment Notes Wound #1 (Foot) Wound Laterality: Dorsal, Left Cleanser Peri-Wound Care Zinc Oxide Ointment 30g tube Discharge Instruction: Apply Zinc Oxide to periwound with each dressing change as needed for excoriation Sween Lotion (Moisturizing lotion) Discharge Instruction: Apply moisturizing lotion as directed Topical Primary Dressing Sorbalgon AG Dressing, 4x4 (in/in) Discharge Instruction: or equivalent silver alginate. Apply to wound bed as instructed Secondary Dressing ABD Pad, 5x9 Discharge Instruction: Apply over primary dressing as directed. Woven Gauze Sponge, Non-Sterile 4x4 in Discharge Instruction: Apply over primary dressing as directed. Secured With Compression Wrap FourPress (4 layer compression wrap) Discharge Instruction: Apply four layer compression as directed. May also use Urgo K2 compression  system as alternative. Compression Stockings Add-Ons Electronic Signature(s) Signed: 09/22/2022 4:05:52 PM By: Baruch Gouty RN, BSN Entered By: Baruch Gouty on 09/22/2022 12:46:10 -------------------------------------------------------------------------------- Wound Assessment Details Patient Name: Date of Service: [name], [name] 09/22/2022 12:30 PM Medical Record Number: [id number] Patient Account Number: [id number] Date of Birth/Sex: Treating RN: 1951/06/21 (72 y.o. [name] Primary Care Marcella Charlson: PA [name], [place] Other Clinician: Referring Yuliana Vandrunen: Treating Azarias Chiou/Extender: [name] in Treatment: [address] SEYDOU, ASANO ([id number]) [id number] Page 8 of 11 Wound Number: 3 Primary Lymphedema Etiology: Wound Location: Right, Medial Lower Leg Wound Open Wounding Event: Gradually Appeared Status: Date Acquired: 12/07/2021 Comorbid Cataracts, Anemia, Lymphedema, Congestive Heart Failure, Deep [name] Of Treatment: 32 History: Vein Thrombosis, Hypertension, Peripheral Venous Disease Clustered Wound: No Photos Wound Measurements Length: (cm) 4.1 Width: (cm) 5.3 Depth: (cm) 0.7 Area: (cm) 17.067 Volume: (cm) 11.947 % Reduction in Area: -219.5% % Reduction in Volume: -459.3% Epithelialization: Small (1-33%) Tunneling: No Undermining: No Wound Description Classification: Full Thickness With Exposed Suppo Wound Margin: Distinct, outline attached Exudate Amount: Medium Exudate Type: Serosanguineous Exudate Color: red, brown  rt Structures Foul Odor After Cleansing: No Slough/Fibrino Yes Wound Bed Granulation Amount: Medium (34-66%) Exposed Structure Granulation Quality: Red Fascia Exposed: No Necrotic Amount: Medium (34-66%) Fat Layer (Subcutaneous Tissue) Exposed: Yes Necrotic Quality: Adherent Slough Tendon Exposed: No Muscle Exposed: No Joint Exposed: No Bone Exposed: No Periwound Skin  Texture Texture Color No Abnormalities Noted: No No Abnormalities Noted: No Excoriation: Yes Erythema: No Hemosiderin Staining: Yes Moisture Rubor: No No Abnormalities Noted: No Dry / Scaly: No Temperature / Pain Maceration: No Temperature: No Abnormality Tenderness on Palpation: Yes Treatment Notes Wound #3 (Lower Leg) Wound Laterality: Right, Medial Cleanser Peri-Wound Care Triamcinolone 15 (g) Discharge Instruction: Use triamcinolone 15 (g) as directed Zinc Oxide Ointment 30g tube Discharge Instruction: Apply Zinc Oxide to excoriated periwound with each dressing change as needed Sween Lotion (Moisturizing lotion) Discharge Instruction: Apply moisturizing lotion as directed Topical Primary Dressing Santyl Ointment Discharge Instruction: to wound bed mixed 1:1 with sodium thiosulfate ointment Yamada, Decorey ([id number] Page 9 of 11 sodium thiosulfate ointment Discharge Instruction: 1:1 mix with santyl to wound bed Secondary Dressing ABD Pad, 8x10 Discharge Instruction: Apply over primary dressing as directed. Woven Gauze Sponge, Non-Sterile 4x4 in Discharge Instruction: Apply over primary dressing as directed. Secured With Compression Wrap FourPress (4 layer compression wrap) Discharge Instruction: Apply four layer compression as directed. May also use Urgo K2 compression system as alternative. Compression Stockings Add-Ons Electronic Signature(s) Signed: 09/22/2022 4:05:52 PM By: Baruch Gouty RN, BSN Entered By: Baruch Gouty on 09/22/2022 12:46:47 -------------------------------------------------------------------------------- Wound Assessment Details Patient Name: Date of Service: [name], [name] 09/22/2022 12:30 PM Medical Record Number: [id number] Patient Account Number: [id number] Date of Birth/Sex: Treating RN: Apr 14, 1951 (72 y.o. [name] Primary Care Kristell Wooding: PA [name], [place] Other Clinician: Referring  Jennipher Weatherholtz: Treating Maleka Contino/Extender: [name] in Treatment: 32 Wound Status Wound Number: 6 Primary Calcinosis Etiology: Wound Location: Right, Anterior Lower Leg Wound Open Wounding Event: Gradually Appeared Status: Date Acquired: 05/12/2022 Comorbid Cataracts, Anemia, Lymphedema, Congestive Heart Failure, Deep [name] Of Treatment: 19 History: Vein Thrombosis, Hypertension, Peripheral Venous Disease Clustered Wound: No Photos Wound Measurements Length: (cm) 0.7 Width: (cm) 0.7 Depth: (cm) 0.2 Area: (cm) 0.385 Volume: (cm) 0.077 % Reduction in Area: -63.1% % Reduction in Volume: -220.8% Epithelialization: None Tunneling: No Undermining: No Wound Description Classification: Full Thickness Without Exposed Support Structures Wound Margin: Epibole Exudate Amount: Medium Exudate Type: Serosanguineous Exudate Color: red, brown Foul Odor After Cleansing: No Slough/Fibrino Yes Wound Bed Granulation Amount: Medium (34-66%) Exposed Structure Granulation Quality: Red Fascia Exposed: No Brawley, Jamie ([id number] Page 10 of 11 Necrotic Amount: Medium (34-66%) Fat Layer (Subcutaneous Tissue) Exposed: Yes Necrotic Quality: Adherent Slough Tendon Exposed: No Muscle Exposed: No Joint Exposed: No Bone Exposed: No Periwound Skin Texture Texture Color No Abnormalities Noted: Yes No Abnormalities Noted: No Atrophie Blanche: No Moisture Cyanosis: No No Abnormalities Noted: Yes Ecchymosis: No Erythema: No Hemosiderin Staining: Yes Mottled: No Pallor: No Rubor: No Temperature / Pain Temperature: No Abnormality Tenderness on Palpation: Yes Treatment Notes Wound #6 (Lower Leg) Wound Laterality: Right, Anterior Cleanser Peri-Wound Care Triamcinolone 15 (g) Discharge Instruction: Use triamcinolone 15 (g) ( in clinic) Zinc Oxide Ointment 30g tube Discharge Instruction: Apply Zinc Oxide to periwound with each  dressing change as needed for excoriation Sween Lotion (Moisturizing lotion) Discharge Instruction: Apply moisturizing lotion as directed Topical Primary Dressing sodium thiosulfate ointment Discharge Instruction: to wound bed with each dressing change Secondary Dressing ABD Pad, 5x9 Discharge Instruction: Apply over primary dressing as directed.  Woven Gauze Sponge, Non-Sterile 4x4 in Discharge Instruction: Apply over primary dressing as directed. Secured With Compression Wrap FourPress (4 layer compression wrap) Discharge Instruction: Apply four layer compression as directed. May also use Urgo K2 compression system as alternative. Compression Stockings Add-Ons Electronic Signature(s) Signed: 09/22/2022 4:05:52 PM By: Baruch Gouty RN, BSN Entered By: Baruch Gouty on 09/22/2022 12:47:26 -------------------------------------------------------------------------------- Vitals Details Patient Name: Date of Service: [name] Grove [name] 09/22/2022 12:30 PM Medical Record Number: [id number] Patient Account Number: [id number] Date of Birth/Sex: Treating RN: [birth year] (72 y.o. [name] Primary Care Locklyn Henriquez: PA [name], [place] Other Clinician: Referring Miran Kautzman: Treating Verdene Creson/Extender: [name] in Treatment: [address] [place], [name] ([id number]) [id number] Page 11 of 11 Time Taken: 12:25 Temperature (F): 98 Height (in): 71 Pulse (bpm): 67 Weight (lbs): 267 Respiratory Rate (breaths/min): 18 Body Mass Index (BMI): 37.2 Blood Pressure (mmHg): 130/75 Reference Range: 80 - 120 mg / dl Electronic Signature(s) Signed: 09/22/2022 4:05:52 PM By: Baruch Gouty RN, BSN Entered By: Baruch Gouty on 09/22/2022 12:25:29

## 2022-09-24 DIAGNOSIS — L97812 Non-pressure chronic ulcer of other part of right lower leg with fat layer exposed: Secondary | ICD-10-CM | POA: Diagnosis not present

## 2022-09-24 DIAGNOSIS — L03116 Cellulitis of left lower limb: Secondary | ICD-10-CM | POA: Diagnosis not present

## 2022-09-24 DIAGNOSIS — L97522 Non-pressure chronic ulcer of other part of left foot with fat layer exposed: Secondary | ICD-10-CM | POA: Diagnosis not present

## 2022-09-24 DIAGNOSIS — I872 Venous insufficiency (chronic) (peripheral): Secondary | ICD-10-CM | POA: Diagnosis not present

## 2022-09-24 DIAGNOSIS — I10 Essential (primary) hypertension: Secondary | ICD-10-CM | POA: Diagnosis not present

## 2022-09-24 DIAGNOSIS — L942 Calcinosis cutis: Secondary | ICD-10-CM | POA: Diagnosis not present

## 2022-09-27 DIAGNOSIS — H9190 Unspecified hearing loss, unspecified ear: Secondary | ICD-10-CM | POA: Diagnosis not present

## 2022-09-27 DIAGNOSIS — Z6837 Body mass index (BMI) 37.0-37.9, adult: Secondary | ICD-10-CM | POA: Diagnosis not present

## 2022-09-27 DIAGNOSIS — Z7901 Long term (current) use of anticoagulants: Secondary | ICD-10-CM | POA: Diagnosis not present

## 2022-09-27 DIAGNOSIS — M5382 Other specified dorsopathies, cervical region: Secondary | ICD-10-CM | POA: Diagnosis not present

## 2022-09-27 DIAGNOSIS — I10 Essential (primary) hypertension: Secondary | ICD-10-CM | POA: Diagnosis not present

## 2022-09-27 DIAGNOSIS — Z87891 Personal history of nicotine dependence: Secondary | ICD-10-CM | POA: Diagnosis not present

## 2022-09-27 DIAGNOSIS — L97812 Non-pressure chronic ulcer of other part of right lower leg with fat layer exposed: Secondary | ICD-10-CM | POA: Diagnosis not present

## 2022-09-27 DIAGNOSIS — E669 Obesity, unspecified: Secondary | ICD-10-CM | POA: Diagnosis not present

## 2022-09-27 DIAGNOSIS — M4800 Spinal stenosis, site unspecified: Secondary | ICD-10-CM | POA: Diagnosis not present

## 2022-09-27 DIAGNOSIS — Z86718 Personal history of other venous thrombosis and embolism: Secondary | ICD-10-CM | POA: Diagnosis not present

## 2022-09-27 DIAGNOSIS — L03116 Cellulitis of left lower limb: Secondary | ICD-10-CM | POA: Diagnosis not present

## 2022-09-27 DIAGNOSIS — Z955 Presence of coronary angioplasty implant and graft: Secondary | ICD-10-CM | POA: Diagnosis not present

## 2022-09-27 DIAGNOSIS — E785 Hyperlipidemia, unspecified: Secondary | ICD-10-CM | POA: Diagnosis not present

## 2022-09-27 DIAGNOSIS — I872 Venous insufficiency (chronic) (peripheral): Secondary | ICD-10-CM | POA: Diagnosis not present

## 2022-09-27 DIAGNOSIS — M5136 Other intervertebral disc degeneration, lumbar region: Secondary | ICD-10-CM | POA: Diagnosis not present

## 2022-09-27 DIAGNOSIS — I251 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: Secondary | ICD-10-CM | POA: Diagnosis not present

## 2022-09-27 DIAGNOSIS — Z981 Arthrodesis status: Secondary | ICD-10-CM | POA: Diagnosis not present

## 2022-09-27 DIAGNOSIS — M609 Myositis, unspecified: Secondary | ICD-10-CM | POA: Diagnosis not present

## 2022-09-27 DIAGNOSIS — L942 Calcinosis cutis: Secondary | ICD-10-CM | POA: Diagnosis not present

## 2022-09-27 DIAGNOSIS — I890 Lymphedema, not elsewhere classified: Secondary | ICD-10-CM | POA: Diagnosis not present

## 2022-09-27 DIAGNOSIS — L97522 Non-pressure chronic ulcer of other part of left foot with fat layer exposed: Secondary | ICD-10-CM | POA: Diagnosis not present

## 2022-09-27 DIAGNOSIS — D539 Nutritional anemia, unspecified: Secondary | ICD-10-CM | POA: Diagnosis not present

## 2022-09-29 ENCOUNTER — Encounter (HOSPITAL_BASED_OUTPATIENT_CLINIC_OR_DEPARTMENT_OTHER): Payer: Medicare Other | Attending: General Surgery | Admitting: General Surgery

## 2022-09-29 DIAGNOSIS — I110 Hypertensive heart disease with heart failure: Secondary | ICD-10-CM | POA: Diagnosis not present

## 2022-09-29 DIAGNOSIS — E785 Hyperlipidemia, unspecified: Secondary | ICD-10-CM | POA: Diagnosis not present

## 2022-09-29 DIAGNOSIS — L97812 Non-pressure chronic ulcer of other part of right lower leg with fat layer exposed: Secondary | ICD-10-CM | POA: Insufficient documentation

## 2022-09-29 DIAGNOSIS — I509 Heart failure, unspecified: Secondary | ICD-10-CM | POA: Insufficient documentation

## 2022-09-29 DIAGNOSIS — I890 Lymphedema, not elsewhere classified: Secondary | ICD-10-CM | POA: Insufficient documentation

## 2022-09-29 DIAGNOSIS — I251 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: Secondary | ICD-10-CM | POA: Insufficient documentation

## 2022-09-29 DIAGNOSIS — E669 Obesity, unspecified: Secondary | ICD-10-CM | POA: Insufficient documentation

## 2022-09-29 DIAGNOSIS — I739 Peripheral vascular disease, unspecified: Secondary | ICD-10-CM | POA: Diagnosis not present

## 2022-09-29 DIAGNOSIS — I872 Venous insufficiency (chronic) (peripheral): Secondary | ICD-10-CM | POA: Insufficient documentation

## 2022-09-29 DIAGNOSIS — L97522 Non-pressure chronic ulcer of other part of left foot with fat layer exposed: Secondary | ICD-10-CM | POA: Insufficient documentation

## 2022-09-29 DIAGNOSIS — L942 Calcinosis cutis: Secondary | ICD-10-CM | POA: Insufficient documentation

## 2022-09-29 DIAGNOSIS — Z86718 Personal history of other venous thrombosis and embolism: Secondary | ICD-10-CM | POA: Diagnosis not present

## 2022-10-01 DIAGNOSIS — I10 Essential (primary) hypertension: Secondary | ICD-10-CM | POA: Diagnosis not present

## 2022-10-01 DIAGNOSIS — L03116 Cellulitis of left lower limb: Secondary | ICD-10-CM | POA: Diagnosis not present

## 2022-10-01 DIAGNOSIS — I872 Venous insufficiency (chronic) (peripheral): Secondary | ICD-10-CM | POA: Diagnosis not present

## 2022-10-01 DIAGNOSIS — L942 Calcinosis cutis: Secondary | ICD-10-CM | POA: Diagnosis not present

## 2022-10-01 DIAGNOSIS — L97522 Non-pressure chronic ulcer of other part of left foot with fat layer exposed: Secondary | ICD-10-CM | POA: Diagnosis not present

## 2022-10-01 DIAGNOSIS — L97812 Non-pressure chronic ulcer of other part of right lower leg with fat layer exposed: Secondary | ICD-10-CM | POA: Diagnosis not present

## 2022-10-01 NOTE — Progress Notes (Signed)
[name] ([id number]) [id number] Page 1 of 15 Visit Report for 09/29/2022 Chief Complaint Document Details Patient Name: Date of Service: [name], [name] 09/29/2022 12:30 PM Medical Record Number: [id number] Patient Account Number: [id number] Date of Birth/Sex: Treating RN: 1950-09-12 (72 y.o. M) Primary Care Provider: PA [name], NO Other Clinician: Referring Provider: Treating Provider/Extender: [name] in Treatment: 33 Information Obtained from: Patient Chief Complaint Patient seen for complaints of Non-Healing Wounds. Electronic Signature(s) Signed: 09/29/2022 1:56:40 PM By: Fredirick Maudlin MD FACS Entered By: Fredirick Maudlin on 09/29/2022 13:56:40 -------------------------------------------------------------------------------- Debridement Details Patient Name: Date of Service: [name], [name] 09/29/2022 12:30 PM Medical Record Number: [id number] Patient Account Number: [id number] Date of Birth/Sex: Treating RN: 1950/08/09 (72 y.o. [name] Primary Care Provider: PA [name], [place] Other Clinician: Referring Provider: Treating Provider/Extender: [name] in Treatment: 33 Debridement Performed for Assessment: Wound #1 Left,Dorsal Foot Performed By: Physician Fredirick Maudlin, MD Debridement Type: Debridement Level of Consciousness (Pre-procedure): Awake and Alert Pre-procedure Verification/Time Out Yes - 13:10 Taken: Start Time: 13:11 Pain Control: Lidocaine 4% T opical Solution T Area Debrided (L x W): otal 0.4 (cm) x 0.6 (cm) = 0.24 (cm) Tissue and other material debrided: Non-Viable, Eschar, Slough, Slough Level: Non-Viable Tissue Debridement Description: Selective/Open Wound Instrument: Curette Bleeding: Minimum Hemostasis Achieved: Pressure Procedural Pain: 0 Post Procedural Pain: 0 Response to Treatment: Procedure was tolerated well Level of Consciousness (Post- Awake and  Alert procedure): Post Debridement Measurements of Total Wound Length: (cm) 0.4 Width: (cm) 0.6 Depth: (cm) 0.1 Volume: (cm) 0.019 Character of Wound/Ulcer Post Debridement: Improved Post Procedure Diagnosis Same as Pre-procedure Notes Scribed for Dr. [name] by Baruch Gouty, RN Electronic Signature(s) Signed: 09/29/2022 5:27:13 PM By: Fredirick Maudlin MD FACS Signed: 09/29/2022 5:28:33 PM By: Baruch Gouty RN, BSN Entered By: Baruch Gouty on 09/29/2022 13:13:09 [name] ([id number]) [id number] Page 2 of 15 -------------------------------------------------------------------------------- Debridement Details Patient Name: Date of Service: [name], [name] 09/29/2022 12:30 PM Medical Record Number: [id number] Patient Account Number: [id number] Date of Birth/Sex: Treating RN: 03-10-1951 (72 y.o. [name] Primary Care Provider: PA [name], [place] Other Clinician: Referring Provider: Treating Provider/Extender: [name] in Treatment: 33 Debridement Performed for Assessment: Wound #6 Right,Anterior Lower Leg Performed By: Physician Fredirick Maudlin, MD Debridement Type: Debridement Level of Consciousness (Pre-procedure): Awake and Alert Pre-procedure Verification/Time Out Yes - 13:10 Taken: Start Time: 13:11 Pain Control: Lidocaine 4% T opical Solution T Area Debrided (L x W): otal 0.5 (cm) x 0.5 (cm) = 0.25 (cm) Tissue and other material debrided: Non-Viable, Slough, Slough Level: Non-Viable Tissue Debridement Description: Selective/Open Wound Instrument: Curette Bleeding: Minimum Hemostasis Achieved: Pressure Procedural Pain: 0 Post Procedural Pain: 0 Response to Treatment: Procedure was tolerated well Level of Consciousness (Post- Awake and Alert procedure): Post Debridement Measurements of Total Wound Length: (cm) 0.5 Width: (cm) 0.5 Depth: (cm) 0.2 Volume: (cm) 0.039 Character of Wound/Ulcer Post  Debridement: Improved Post Procedure Diagnosis Same as Pre-procedure Notes Scribed for Dr. [name] by Baruch Gouty, RN Electronic Signature(s) Signed: 09/29/2022 5:27:13 PM By: Fredirick Maudlin MD FACS Signed: 09/29/2022 5:28:33 PM By: Baruch Gouty RN, BSN Entered By: Baruch Gouty on 09/29/2022 13:13:40 -------------------------------------------------------------------------------- Debridement Details Patient Name: Date of Service: [name] 09/29/2022 12:30 PM Medical Record Number: [id number] Patient Account Number: [id number] Date of Birth/Sex: Treating RN: 07-18-1951 (72 y.o. [name] Primary Care Provider: PA [name], [place] Other Clinician: Referring Provider: Treating Provider/Extender: [name] in Treatment: 33 Debridement Performed  for Assessment: Wound #3 Right,Medial Lower Leg Performed By: Physician Fredirick Maudlin, MD Debridement Type: Debridement Level of Consciousness (Pre-procedure): Awake and Alert Pre-procedure Verification/Time Out Yes - 13:10 Taken: Start Time: 13:11 Pain Control: Lidocaine 4% T opical Solution T Area Debrided (L x W): otal 2.8 (cm) x 5 (cm) = 14 (cm) Tissue and other material debrided: Viable, Non-Viable, Slough, Subcutaneous, Slough Level: Skin/Subcutaneous Tissue Debridement Description: Excisional Instrument: Curette Bleeding: Minimum SYLAR, FOXX ([id number]) [id number] Page 3 of 15 Hemostasis Achieved: Pressure Procedural Pain: 0 Post Procedural Pain: 0 Response to Treatment: Procedure was tolerated well Level of Consciousness (Post- Awake and Alert procedure): Post Debridement Measurements of Total Wound Length: (cm) 2.8 Width: (cm) 5 Depth: (cm) 0.5 Volume: (cm) 5.498 Character of Wound/Ulcer Post Debridement: Improved Post Procedure Diagnosis Same as Pre-procedure Notes Scribed for Dr. [name] by Baruch Gouty, RN Electronic Signature(s) Signed:  09/29/2022 5:27:13 PM By: Fredirick Maudlin MD FACS Signed: 09/29/2022 5:28:33 PM By: Baruch Gouty RN, BSN Entered By: Baruch Gouty on 09/29/2022 13:14:25 -------------------------------------------------------------------------------- HPI Details Patient Name: Date of Service: [name] 09/29/2022 12:30 PM Medical Record Number: [id number] Patient Account Number: [id number] Date of Birth/Sex: Treating RN: [birth year] (72 y.o. M) Primary Care Provider: PA [name], NO Other Clinician: Referring Provider: Treating Provider/Extender: [name] in Treatment: 33 History of Present Illness HPI Description: ADMISSION 02/09/2022 This is a 72 year old man who recently moved to [place] from [place]. He has a history of of coronary artery disease and prior left popliteal vein DVT . He is not diabetic and does not smoke. 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 He has had multiple spinal surgeries and has limited mobility. He primarily uses a motorized wheelchair. He has had lymphedema for many years and does have lymphedema pumps although he says he does not use them frequently. He has wounds on his bilateral lower extremities. He was receiving care in [place] for these prior to his move. They were apparently packing his wounds with Betadine soaked gauze. He has worn compression wraps in the past but not recently. He did say that they helped tremendously. In clinic today, his right ABI is noncompressible but has good signals; the left ABI was 1.17. No formal vascular studies have been performed. On his left dorsal foot, there is a circular lesion that exposes the fat layer. There is fairly heavy slough accumulation within the wound, but no significant odor. On his right medial calf, he has multiple pinholes that have slough buildup in them and probe for about 2 to 4 mm in depth. They are draining clear fluid. On his right lateral midfoot, he has ulceration  consistent with venous stasis. It is geographic and has slough accumulation. 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 02/17/2022: All of the wounds are little bit larger today and have more slough accumulation. Today, the medial calf openings are larger and probe to something that feels calcified. There is odor coming from his wounds. His vascular studies are scheduled for August 9 and 10. 02-24-2022 upon evaluation today patient presents for follow-up concerning his ongoing issues here with his lower extremities. With that being said he does have significant problems with what appears to be cellulitis unfortunately the PCR culture that was sent last week got crushed by [employer] in transit. With that being said we are going to reobtain a culture today although I am actually to do  it on the right medial leg as this seems to be the most inflamed area today where there is some questionable drainage as well. I am going to remove some of the calcium deposits which are loosening obtain a deeper culture from this area. Fortunately I do not see any evidence of active infection systemically which is good news but locally I think we are having a bigger issue here. He does have his appointment next Thursday with vascular. Patient has also been referred to [facility] for further evaluation and treatment of the calcinosis for possible sulfate injections. 03/04/2022: The culture that was performed last week grew out multiple species including Klebsiella, Morganella, and Pseudomonas aeruginosa. He had been prescribed Bactrim but this was not adequate to cover all of the species and levofloxacin was recommended. He completed the Bactrim but did not pick up the levofloxacin and begin taking it until yesterday. 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 He had arterial studies performed today. He does have evidence of peripheral arterial disease. Results copied here: +-------+-----------+-----------+------------+------------+ ABI/TBIT oday's ABIT oday's TBIPrevious ABIPrevious TBI +-------+-----------+-----------+------------+------------+ Right [place] 0.75    +-------+-----------+-----------+------------+------------+ Left [place] 0.57    +-------+-----------+-----------+------------+------------+ [name] ([id number]) [id number] Page 4 of 15 Arterial wall calcification precludes accurate ankle pressures and ABIs. Summary: Right: Resting right ankle-brachial index indicates noncompressible right lower extremity arteries. The right toe-brachial index is normal. Left: Resting left ankle-brachial index indicates noncompressible left lower extremity arteries. The left toe-brachial index is abnormal. He continues to have further tissue breakdown at the right medial calf and there is ample slough accumulation along with necrotic subcutaneous tissue. The left dorsal foot wound has built up slough, as well. The right medial foot wound is nearly closed with just a light layer of eschar over the surface. The right dorsal lateral foot wound has also accumulated a fair amount of slough and remains quite tender. 03/10/2022: He has been taking the prescribed levofloxacin and has about 3 days left of this. The erythema and induration on his right leg has improved. He continues to experience further breakdown of the right medial calf wound. There is extensive slough and debris accumulation there. There is heavy slough on his left dorsal foot wound, but no odor or purulent drainage. The right medial foot wound appears to be closed. The right dorsal lateral foot wound also has a fair amount of slough but it is less tender than at our last  visit. He still does not have an appointment with dermatology. 03/22/2022: The right anterior tibial wound has closed. The right lateral foot wound is nearly closed with just a little bit of slough. The left dorsal foot wound is smaller and continues to have some slough buildup but less so than at prior visits. There is good granulation tissue underlying the slough. Unfortunately the right medial calf wound continues to deteriorate. It has a foul odor today and a lot of necrotic tissue, the surrounding skin is also erythematous and moist. 03/30/2022: The right lateral foot wound continues to contract and just has a little bit of slough and eschar present. The left dorsal foot wound is also smaller with slough overlying good granulation tissue. The right medial calf wound actually looks quite a bit better today; there is no odor and there is much less necrotic tissue although some remains present. We have been using topical gentamicin  and he has been taking oral Augmentin. 04/07/2022: The patient saw dermatology at [facility] last week. Unfortunately, it appears that they did not read any of the documentation that was provided. 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 Overall, it was a highly unsatisfactory consultation. In the meantime, however, we were able to identify compounding pharmacy who could create a topical sodium thiosulfate ointment. The patient has that with him today. Both of the right foot wounds are now closed. The left dorsal foot wound has contracted but still has a layer of slough on the surface. The medial calf wounds on the right all look a little bit better. They still have heavy slough along with the chunks of  calcium. Everything is stained purple. 04/14/2022: The wound on his dorsal left foot is a little bit smaller again today. There is still slough accumulation on the surface. The medial calf wounds have quite a bit less slough accumulation today. His right leg, however, is warm and erythematous, concerning for cellulitis. 04/14/2022: He completed his course of Augmentin yesterday. The medial calf wound sites are much cleaner today and shallower. There is less fragmented calcium in the wounds. He has a lot of lymphedema fluid-like drainage from these wounds, but no purulent discharge or malodor. The wound on his dorsal left foot has also contracted and is shallower. There is a layer of slough over healthy granulation tissue. 05/05/2022: The left dorsal foot wound is smaller today with less slough and more granulation tissue. The right medial calf wounds are shallower, but have extensive slough and some fat necrosis. The drainage has diminished considerably, however. 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 05/12/2022: The left dorsal foot wound continues to contract and fill with granulation tissue. There is slough on the wound surface. The right medial calf wounds also continue to fill with healthy tissue, but there is remaining slough and fat necrosis present. He had a significant increase in his drainage this week and it was a bright yellow-green color. He also had a new wound open over his right anterior tibial surface associated with the spike of cutaneous calcium. The leg is more red, as well, concerning for infection. 05/19/2022: His culture returned positive for Pseudomonas. He is currently taking a course of oral levofloxacin. We have also been using topical gentamicin mixed in with his sodium thiosulfate. All of the wounds look better this week. The ones associated with his  calcinosis cutis have filled in substantially. There is slough and nonviable subcutaneous tissue still present. The new anterior tibial wound just has a light layer of slough. The dorsal foot wound also has some slough present and appears a little dry today. 05/26/2022: The right medial leg wound continues to improve. It is feeling with granulation tissue, but still accumulates a fairly thick layer of slough on the surface. The more recent anterior tibial wound has remained quite small, but also has some slough on the surface. The left dorsal foot wound has contracted somewhat and has perimeter epithelialization. He completed his oral levofloxacin. 06/02/2022: The left dorsal foot wound continues to improve significantly. There is just a little slough on the  wound surface. The right medial leg wound had black drainage, but it looks like silver alginate was applied last week and I theorized that silver sulfide was created with a combination of the silver alginate and the sodium thiosulfate. It did, however, have a bit of an odor to it and extensive slough accumulation. The small anterior tibial wound also had a bit of slough and nonviable subcutaneous tissue present. The skin of his leg was rather macerated, as well. 06/09/2022: The left dorsal foot wound is smaller again this week with just a light layer of slough on the surface. The right medial leg wound looks substantially better. The leg and periwound skin are no longer red and macerated. There is good granulation tissue forming with less slough and nonviable tissue. The anterior tibial wound just has a small amount of slough accumulation. 06/16/2022: The left dorsal foot wound continues to contract. His wrap slipped a little bit, however, and his foot is more swollen. The right medial leg wound continues to improve. The underlying tissue is more vital-appearing and there is less slough. He does have substantial drainage. The small anterior tibial  wound has a bit of slough accumulation. 06/23/2022: The left dorsal foot wound is about half the size as it was last week. There is just some light slough on the surface and some eschar buildup around the edges. The right anterior leg wound is small with a little slough on the surface. Unfortunately, the right medial leg wound deteriorated fairly substantially. It is malodorous with gray/green/black discoloration. 06/30/2022: The left dorsal foot wound continues to contract. It is nearly closed with just a light layer of slough present. The right anterior leg wound is about the same size and has a small amount of slough on the surface. The right medial leg wound looks a lot better than it did last week. The odor has abated and the tissue is less pulpy and necrotic-looking. There is still a fairly thick layer of slough present. 12/13; the left dorsal foot wound looks very healthy. There was no need to change the dressing here and no debridement. He has a small area on the right anterior lower leg apparently had not changed much in size. The area on the medial leg is the most problematic area this is large with some depth and a completely nonviable surface today. 07/14/2022: The left dorsal foot wound is smaller today with just a little slough and eschar present. The right anterior lower leg wound is about the same without any significant change. The medial leg wound has a black and green surface and is malodorous. No purulent drainage and the periwound skin is intact. Edema control is suboptimal. 07/21/2022: The dorsal foot wound continues to contract and is nearly closed with just a little bit of slough and eschar on the surface. The right anterior lower leg wound is shallower today but otherwise the dimensions are unchanged. The medial leg wound looks significantly better although it still has a nonviable EVERTTE, UNKEFER ([id number]) [id number] Page 5 of 15 surface; the odor  and black/green surface has resolved. His culture came back with multiple species sensitive to Augmentin, which was prescribed and a very low level of Pseudomonas, which should be handled by the topical gentamicin we have been using. 07/28/2022: The dorsal foot wound is down to just a very tiny opening with a little eschar on the surface. The right anterior tibial wound is about the same with some slough buildup. The right medial leg wound looks quite  a bit better this week. There is thick rubbery slough on the surface, but the odor and drainage has improved significantly. 08/04/2022: The dorsal foot wound is almost healed, but the periwound skin has broken down. This appears to be secondary to moisture and adhesive from the absorbent pad. The right anterior tibial wound is shallower. The right medial leg wound continues to improve. It is smaller, still with rubbery slough on the surface. No malodor or purulent drainage. 08/11/2022: The dorsal foot wound is about the same size. It is a little bit dry with some slough accumulation. The right anterior tibial wound is also unchanged. The right medial leg wound is filling in further with good granulation tissue. Still with some slough buildup on the surface. 08/18/2022: His left leg is bright red, hot, and swollen with cellulitis. The dorsal foot wound actually looks quite good and is superficial with just a little bit of slough accumulation. The right anterior tibial wound is unchanged. The right medial leg wound has filled in with good granulation tissue but has copious amounts of drainage. 08/25/2022: His cellulitis has responded nicely to the oral antibiotics. His dorsal foot wound is smaller but has a fair amount of slough buildup. The right anterior tibial wound remains stable and unchanged. The right medial leg wound has more granulation tissue under a layer of slough, but continues to have copious drainage. The drainage is actually causing skin  irritation and threatens to result in breakdown. 09/01/2022: He continues to have profuse drainage from his wounds which is resulting in tissue maceration on both the dorsum of his left foot and the periwound distal to his medial leg wound. The dorsal foot wound is nearly healed, despite this. The medial right leg wound is filling in with good granulation tissue. We are working on getting home health assistance to change his dressings more frequently to try and control the drainage better. 09/08/2022: We have had success in controlling his drainage. The tissue maceration has improved significantly and his swelling has come down quite markedly. The medial right leg wound is much more superficial and has substantially less nonviable tissue present. The anterior tibial wound is a little bit larger, however with some nonviable tissue present. 09/15/2022: The dorsal foot wound is nearly closed. The anterior tibial wound measures slightly larger today. The medial right leg wound continues to improve quite dramatically with less nonviable tissue and more robust-looking granulation. Edema control is significantly better. 09/22/2022: The left dorsal foot wound remains open, albeit quite tiny. The anterior tibial wound is a little bit larger again today. The medial right leg wound continues to improve with an increase in robust and healthy granulation tissue and less accumulation of nonviable tissue. 09/29/2022: His home health providers wrapped his left leg extremely tightly and he ended up having to cut the wrap off of his foot. This resulted in his foot being very swollen and that the dorsal foot wound is a little bit larger today. The anterior tibial wound is about the same size with a little slough on the surface. The medial right leg wound continues to fill in. There are very few areas with any significant depth. There is still slough accumulation. Electronic Signature(s) Signed: 09/29/2022 2:06:05 PM By: Fredirick Maudlin MD FACS Entered By: Fredirick Maudlin on 09/29/2022 14:06:04 -------------------------------------------------------------------------------- Physical Exam Details Patient Name: Date of Service: [name], [name] 09/29/2022 12:30 PM Medical Record Number: [id number] Patient Account Number: [id number] Date of Birth/Sex: Treating RN: May 06, 1951 (72 y.o. M) Primary Care  Provider: PA Darnelle Spangle Other Clinician: Referring Provider: Treating Provider/Extender: [name], [name] Constitutional . . . . no acute distress. Respiratory Normal work of breathing on room air. Notes 09/29/2022: The dorsal foot wound is a little bit larger today. The anterior tibial wound is about the same size with a little slough on the surface. The medial right leg wound continues to fill in. There are very few areas with any significant depth. There is still slough accumulation. Electronic Signature(s) Signed: 09/29/2022 2:06:50 PM By: Fredirick Maudlin MD FACS Entered By: Fredirick Maudlin on 09/29/2022 14:06:50 -------------------------------------------------------------------------------- Physician Orders Details Patient Name: Date of Service: KENETH, ETCHELLS [name] 09/29/2022 12:30 PM Medical Record Number: [id number] Patient Account Number: [id number] Date of Birth/Sex: Treating RN: 01/08/1951 (72 y.o. 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 - Dr. [name] Rm 1 Return A Wednesday 3/13 @ 1230 pm Anesthetic Wound #1 Left,Dorsal Foot (In clinic) Topical Lidocaine 4% applied to wound bed Wound #3 Right,Medial Lower Leg (In clinic) Topical Lidocaine 4% applied to wound bed Wound #6 Right,Anterior Lower Leg (In clinic) Topical Lidocaine 4% applied to wound bed Bathing/ Shower/ Hygiene May shower with protection but do not get wound dressing(s) wet. Protect dressing(s) with water repellant cover (for example, large plastic bag) or a cast cover and may then take shower. Edema Control - Lymphedema / SCD / Other Lymphedema Pumps. Use Lymphedema pumps on leg(s) 2-3 times a day for 45-60 minutes. If wearing any wraps or hose, do not remove them. Continue exercising as instructed. - use 1-2 times per day over wraps Elevate legs to the level of the heart or above for 30 minutes daily and/or when sitting for 3-4 times a day throughout the day. - throughout the day Avoid standing for long periods of time. Exercise regularly [place] wound care orders this week; continue Home Health for wound care. May utilize formulary equivalent dressing for wound treatment orders unless otherwise specified. - Change right leg dressing only!! Dressing changes to be completed by [place] on Monday / Wednesday / Friday except when patient has scheduled visit at [facility]. - pt seen in clinic on Wednesday Other Donna Orders/Instructions: - [facility] Wound Treatment Wound #1 - Foot Wound Laterality: Dorsal, Left Peri-Wound Care: Zinc Oxide Ointment 30g tube 1 x Per Week/30 Days Discharge Instructions: Apply Zinc Oxide to periwound with each dressing change as needed for  excoriation Peri-Wound Care: Sween Lotion (Moisturizing lotion) 1 x Per Week/30 Days Discharge Instructions: Apply moisturizing lotion as directed Prim Dressing: Sorbalgon [employer] Dressing, 4x4 (in/in) (Generic) 1 x Per Week/30 Days ary Discharge Instructions: or equivalent silver alginate. Apply to wound bed as instructed Secondary Dressing: ABD Pad, 5x9 (Generic) 1 x Per Week/30 Days Discharge Instructions: Apply over primary dressing as directed. Secondary Dressing: Woven Gauze Sponge, Non-Sterile 4x4 in (Generic) 1 x  Per Week/30 Days Discharge Instructions: Apply over primary dressing as directed. Compression Wrap: FourPress (4 layer compression wrap) (Generic) 1 x Per Week/30 Days Discharge Instructions: Apply four layer compression as directed. May also use Urgo K2 compression system as alternative. Wound #3 - Lower Leg Wound Laterality: Right, Medial Peri-Wound Care: Triamcinolone 15 (g) 3 x Per Week/30 Days Discharge Instructions: Use triamcinolone 15 (g) as directed Peri-Wound Care: Zinc Oxide Ointment 30g tube 3 x Per Week/30 Days Discharge Instructions: Apply Zinc Oxide to excoriated periwound with each dressing change as needed LARWRENCE, BRAZZEL ([id number]) [id number] Page 7 of 15 Peri-Wound Care: Sween Lotion (Moisturizing lotion) 3 x Per Week/30 Days Discharge Instructions: Apply moisturizing lotion as directed Prim Dressing: Santyl Ointment 3 x Per Week/30 Days ary Discharge Instructions: to wound bed mixed 1:1 with sodium thiosulfate ointment Prim Dressing: sodium thiosulfate ointment 3 x Per Week/30 Days ary Discharge Instructions: 1:1 mix with santyl to wound bed Secondary Dressing: ABD Pad, 8x10 (Generic) 3 x Per Week/30 Days Discharge Instructions: Apply over primary dressing as directed. Secondary Dressing: Woven Gauze Sponge, Non-Sterile 4x4 in (Generic) 3 x Per Week/30 Days Discharge Instructions: Apply over primary dressing as  directed. Compression Wrap: FourPress (4 layer compression wrap) (Generic) 3 x Per Week/30 Days Discharge Instructions: Apply four layer compression as directed. May also use Urgo K2 compression system as alternative. Wound #6 - Lower Leg Wound Laterality: Right, Anterior Peri-Wound Care: Triamcinolone 15 (g) 3 x Per Week/30 Days Discharge Instructions: Use triamcinolone 15 (g) ( in clinic) Peri-Wound Care: Zinc Oxide Ointment 30g tube 3 x Per Week/30 Days Discharge Instructions: Apply Zinc Oxide to periwound with each dressing change as needed for excoriation Peri-Wound Care: Sween Lotion (Moisturizing lotion) 3 x Per Week/30 Days Discharge Instructions: Apply moisturizing lotion as directed Prim Dressing: sodium thiosulfate ointment 3 x Per Week/30 Days ary Discharge Instructions: to wound bed with each dressing change Secondary Dressing: ABD Pad, 5x9 (Generic) 3 x Per Week/30 Days Discharge Instructions: Apply over primary dressing as directed. Secondary Dressing: Woven Gauze Sponge, Non-Sterile 4x4 in (Generic) 3 x Per Week/30 Days Discharge Instructions: Apply over primary dressing as directed. Compression Wrap: FourPress (4 layer compression wrap) (Generic) 3 x Per Week/30 Days Discharge Instructions: Apply four layer compression as directed. May also use Urgo K2 compression system as alternative. Electronic Signature(s) Signed: 09/29/2022 5:27:13 PM By: Fredirick Maudlin MD FACS Entered By: Fredirick Maudlin on 09/29/2022 14:07:41 -------------------------------------------------------------------------------- Problem List Details Patient Name: Date of Service: [name], [name] 09/29/2022 12:30 PM Medical Record Number: [id number] Patient Account Number: [id number] Date of Birth/Sex: Treating RN: 1950/12/23 (72 y.o. 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 02/09/2022 No Yes EDO, GRZYB ([id number]) [id number] Page 8 of 15 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) 02/09/2022 No Yes I10 Essential (primary) hypertension 02/09/2022 No Yes I25.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 02/09/2022 No Yes I89.0 Lymphedema, not elsewhere classified 02/09/2022 No Yes Z86.718 Personal history of other venous thrombosis and embolism 02/09/2022 No Yes Inactive Problems ICD-10 Code Description Active Date Inactive Date L97.512 Non-pressure chronic ulcer of other part of right foot with fat layer  exposed 02/17/2022 02/17/2022 Resolved Problems Electronic Signature(s) Signed: 09/29/2022 1:56:24 PM By: Fredirick Maudlin MD FACS Entered By: Fredirick Maudlin on 09/29/2022 13:56:24 -------------------------------------------------------------------------------- Progress Note Details Patient Name: Date of Service: [name], [name] 09/29/2022 12:30 PM Medical Record Number: [id number] Patient Account Number: [id number] Date of Birth/Sex: Treating RN: [birth year] (72 y.o. M) Primary Care Provider: PA [name], [place] Other Clinician: Referring Provider: Treating Provider/Extender: [name] in Treatment: 33 Subjective Chief Complaint Information obtained from Patient Patient seen for complaints of Non-Healing Wounds. History of Present Illness (HPI) ADMISSION 02/09/2022 This is a 72 year old man who recently moved to [place] from [place]. He has a history of of coronary artery disease and prior left popliteal vein DVT . He is not diabetic and does not smoke.  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 He has had multiple spinal surgeries and has limited mobility. He primarily uses a motorized wheelchair. He has had lymphedema for many years and does have lymphedema pumps although he says he does not use them frequently. He has wounds on his bilateral lower extremities. He was receiving care in [place] for these prior to his move. They were apparently packing his wounds with Betadine soaked gauze. He has worn compression wraps in the past but not recently. He did say that they helped tremendously. In clinic today, his right ABI is noncompressible but has good signals; the left ABI was 1.17. No formal vascular studies have been performed. On his left dorsal foot, there is a circular lesion that exposes the fat layer. There is fairly heavy slough accumulation within the wound, but no significant odor. On his right medial calf, he has multiple pinholes that have slough buildup in them and probe for about 2 to 4 mm in depth. They are draining clear fluid. On his right lateral midfoot, he has ulceration consistent with venous stasis. It is geographic and has slough accumulation. 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 02/17/2022: All of the wounds are little bit larger today and have more slough accumulation. Today, the medial calf openings are larger and probe to something that feels calcified. There is odor coming from his wounds. His vascular studies are scheduled for August 9 and 10. 02-24-2022 upon evaluation today patient presents for follow-up concerning his ongoing issues here with his lower extremities. With that being said he does have significant problems with what appears to be cellulitis unfortunately the PCR culture that was sent last week got crushed by [employer] in transit. With that being said we are going to reobtain a culture today although I am  actually to do it on the right medial leg as this seems to be the most inflamed area today where there is some questionable drainage as well. I am going to remove some of the calcium deposits which are loosening obtain a deeper culture from this area. Fortunately I do not see any evidence of active infection systemically which is good news but locally I think we are having a bigger issue here. He does have his appointment next Thursday with vascular. Patient has also been referred to [facility] for further evaluation and treatment of the calcinosis for possible SAVIEL, WAPLE ([id number]) [id number] Page 9 of 15 sulfate injections. 03/04/2022: The culture that was performed last week grew out multiple species including Klebsiella, Morganella, and Pseudomonas aeruginosa. He had been prescribed Bactrim but this was not adequate to cover all  of the species and levofloxacin was recommended. He completed the Bactrim but did not pick up the levofloxacin and begin taking it until yesterday. We referred him to dermatology at 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 He had arterial studies performed today. He does have evidence of peripheral arterial disease. Results copied here: +-------+-----------+-----------+------------+------------+ ABI/TBIT oday's ABIT oday's TBIPrevious ABIPrevious TBI +-------+-----------+-----------+------------+------------+ Right [place] 0.75    +-------+-----------+-----------+------------+------------+ Left [place] 0.57    +-------+-----------+-----------+------------+------------+ Arterial wall calcification precludes accurate ankle pressures and ABIs. Summary: Right: Resting right ankle-brachial index indicates noncompressible right lower extremity arteries. The right toe-brachial index is normal. Left:  Resting left ankle-brachial index indicates noncompressible left lower extremity arteries. The left toe-brachial index is abnormal. He continues to have further tissue breakdown at the right medial calf and there is ample slough accumulation along with necrotic subcutaneous tissue. The left dorsal foot wound has built up slough, as well. The right medial foot wound is nearly closed with just a light layer of eschar over the surface. The right dorsal lateral foot wound has also accumulated a fair amount of slough and remains quite tender. 03/10/2022: He has been taking the prescribed levofloxacin and has about 3 days left of this. The erythema and induration on his right leg has improved. He continues to experience further breakdown of the right medial calf wound. There is extensive slough and debris accumulation there. There is heavy slough on his left dorsal foot wound, but no odor or purulent drainage. The right medial foot wound appears to be closed. The right dorsal lateral foot wound also has a fair amount of slough but it is less tender than at our last visit. He still does not have an appointment with dermatology. 03/22/2022: The right anterior tibial wound has closed. The right lateral foot wound is nearly closed with just a little bit of slough. The left dorsal foot wound is smaller and continues to have some slough buildup but less so than at prior visits. There is good granulation tissue underlying the slough. Unfortunately the right medial calf wound continues to deteriorate. It has a foul odor today and a lot of necrotic tissue, the surrounding skin is also erythematous and moist. 03/30/2022: The right lateral foot wound continues to contract and just has a little bit of slough and eschar present. The left dorsal foot wound is also smaller with slough overlying good granulation tissue. The right medial calf wound actually looks quite a bit better today; there is no odor and there is much less  necrotic tissue although some remains present. We have been using topical gentamicin and he has been taking oral Augmentin. 04/07/2022: The patient saw dermatology at [facility] last week. Unfortunately, it appears that they did not read any of the documentation that was provided. 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 Overall, it was a highly unsatisfactory consultation. In the meantime, however, we were able to identify compounding pharmacy who could create a topical sodium thiosulfate ointment. The patient has that with him today. Both of the right foot wounds are now closed. The left dorsal foot wound has contracted but still has a layer of slough  on the surface. The medial calf wounds on the right all look a little bit better. They still have heavy slough along with the chunks of calcium. Everything is stained purple. 04/14/2022: The wound on his dorsal left foot is a little bit smaller again today. There is still slough accumulation on the surface. The medial calf wounds have quite a bit less slough accumulation today. His right leg, however, is warm and erythematous, concerning for cellulitis. 04/14/2022: He completed his course of Augmentin yesterday. The medial calf wound sites are much cleaner today and shallower. There is less fragmented calcium in the wounds. He has a lot of lymphedema fluid-like drainage from these wounds, but no purulent discharge or malodor. The wound on his dorsal left foot has also contracted and is shallower. There is a layer of slough over healthy granulation tissue. 05/05/2022: The left dorsal foot wound is smaller today with less slough and more granulation tissue. The right medial calf wounds are  shallower, but have extensive slough and some fat necrosis. The drainage has diminished considerably, however. 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 05/12/2022: The left dorsal foot wound continues to contract and fill with granulation tissue. There is slough on the wound surface. The right medial calf wounds also continue to fill with healthy tissue, but there is remaining slough and fat necrosis present. He had a significant increase in his drainage this week and it was a bright yellow-green color. He also had a new wound open over his right anterior tibial surface associated with the spike of cutaneous calcium. The leg is more red, as well, concerning for infection. 05/19/2022: His culture returned positive for Pseudomonas. He is currently taking a course of oral levofloxacin. We have also been using topical gentamicin mixed in with his sodium thiosulfate. All of the wounds look better this week. 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 There is slough and nonviable subcutaneous tissue still present. The new anterior tibial wound just has a light layer of slough. The dorsal foot wound also has some slough present and appears a little dry today. 05/26/2022: The right medial leg wound continues to improve. It is feeling with granulation tissue, but still accumulates a fairly thick layer of slough on the surface. The more recent anterior tibial wound has remained quite small, but also has some slough on the surface. The left dorsal foot wound has contracted somewhat and has perimeter epithelialization. He completed his oral levofloxacin. 06/02/2022: The left dorsal foot wound continues to improve significantly. There is just a little slough on the wound surface. The right medial leg wound had black drainage, but it looks like silver alginate was  applied last week and I theorized that silver sulfide was created with a combination of the silver alginate and the sodium thiosulfate. It did, however, have a bit of an odor to it and extensive slough accumulation. The small anterior tibial wound also had a bit of slough and nonviable subcutaneous tissue present. The skin of his leg was rather macerated, as well. 06/09/2022: The left dorsal foot wound is smaller again this week with just a light layer of slough on the surface. The right medial leg wound looks substantially better. The leg and periwound skin are no longer red and macerated. There is good granulation tissue forming with less slough and nonviable tissue. The anterior tibial wound just  has a small amount of slough accumulation. 06/16/2022: The left dorsal foot wound continues to contract. His wrap slipped a little bit, however, and his foot is more swollen. The right medial leg wound continues to improve. The underlying tissue is more vital-appearing and there is less slough. He does have substantial drainage. The small anterior tibial wound has a bit of slough accumulation. JAMAIR, NOFTZ ([id number]) [id number] Page 10 of 15 06/23/2022: The left dorsal foot wound is about half the size as it was last week. There is just some light slough on the surface and some eschar buildup around the edges. The right anterior leg wound is small with a little slough on the surface. Unfortunately, the right medial leg wound deteriorated fairly substantially. It is malodorous with gray/green/black discoloration. 06/30/2022: The left dorsal foot wound continues to contract. It is nearly closed with just a light layer of slough present. The right anterior leg wound is about the same size and has a small amount of slough on the surface. The right medial leg wound looks a lot better than it did last week. The odor has abated and the tissue is less pulpy and necrotic-looking. There  is still a fairly thick layer of slough present. 12/13; the left dorsal foot wound looks very healthy. There was no need to change the dressing here and no debridement. He has a small area on the right anterior lower leg apparently had not changed much in size. The area on the medial leg is the most problematic area this is large with some depth and a completely nonviable surface today. 07/14/2022: The left dorsal foot wound is smaller today with just a little slough and eschar present. The right anterior lower leg wound is about the same without any significant change. The medial leg wound has a black and green surface and is malodorous. No purulent drainage and the periwound skin is intact. Edema control is suboptimal. 07/21/2022: The dorsal foot wound continues to contract and is nearly closed with just a little bit of slough and eschar on the surface. The right anterior lower leg wound is shallower today but otherwise the dimensions are unchanged. The medial leg wound looks significantly better although it still has a nonviable surface; the odor and black/green surface has resolved. His culture came back with multiple species sensitive to Augmentin, which was prescribed and a very low level of Pseudomonas, which should be handled by the topical gentamicin we have been using. 07/28/2022: The dorsal foot wound is down to just a very tiny opening with a little eschar on the surface. The right anterior tibial wound is about the same with some slough buildup. The right medial leg wound looks quite a bit better this week. There is thick rubbery slough on the surface, but the odor and drainage has improved significantly. 08/04/2022: The dorsal foot wound is almost healed, but the periwound skin has broken down. This appears to be secondary to moisture and adhesive from the absorbent pad. The right anterior tibial wound is shallower. The right medial leg wound continues to improve. It is smaller, still with  rubbery slough on the surface. No malodor or purulent drainage. 08/11/2022: The dorsal foot wound is about the same size. It is a little bit dry with some slough accumulation. The right anterior tibial wound is also unchanged. The right medial leg wound is filling in further with good granulation tissue. Still with some slough buildup on the surface. 08/18/2022: His left leg is bright red, hot, and  swollen with cellulitis. The dorsal foot wound actually looks quite good and is superficial with just a little bit of slough accumulation. The right anterior tibial wound is unchanged. The right medial leg wound has filled in with good granulation tissue but has copious amounts of drainage. 08/25/2022: His cellulitis has responded nicely to the oral antibiotics. His dorsal foot wound is smaller but has a fair amount of slough buildup. The right anterior tibial wound remains stable and unchanged. The right medial leg wound has more granulation tissue under a layer of slough, but continues to have copious drainage. The drainage is actually causing skin irritation and threatens to result in breakdown. 09/01/2022: He continues to have profuse drainage from his wounds which is resulting in tissue maceration on both the dorsum of his left foot and the periwound distal to his medial leg wound. The dorsal foot wound is nearly healed, despite this. The medial right leg wound is filling in with good granulation tissue. We are working on getting home health assistance to change his dressings more frequently to try and control the drainage better. 09/08/2022: We have had success in controlling his drainage. The tissue maceration has improved significantly and his swelling has come down quite markedly. The medial right leg wound is much more superficial and has substantially less nonviable tissue present. The anterior tibial wound is a little bit larger, however with some nonviable tissue present. 09/15/2022: The dorsal foot  wound is nearly closed. The anterior tibial wound measures slightly larger today. The medial right leg wound continues to improve quite dramatically with less nonviable tissue and more robust-looking granulation. Edema control is significantly better. 09/22/2022: The left dorsal foot wound remains open, albeit quite tiny. The anterior tibial wound is a little bit larger again today. The medial right leg wound continues to improve with an increase in robust and healthy granulation tissue and less accumulation of nonviable tissue. 09/29/2022: His home health providers wrapped his left leg extremely tightly and he ended up having to cut the wrap off of his foot. This resulted in his foot being very swollen and that the dorsal foot wound is a little bit larger today. The anterior tibial wound is about the same size with a little slough on the surface. The medial right leg wound continues to fill in. There are very few areas with any significant depth. There is still slough accumulation. Patient History Information obtained from Patient, Chart. Family History Cancer - Mother, Heart Disease - Father,Mother, Hypertension - Father,Siblings, Lung Disease - Siblings, No family history of Diabetes, Hereditary Spherocytosis, Kidney Disease, Seizures, Stroke, Thyroid Problems, Tuberculosis. Social History Former smoker - quit 1991, Marital Status - Married, Alcohol Use - Moderate, Drug Use - No History, Caffeine Use - Daily - coffee. Medical History Eyes Patient has history of Cataracts - right eye removed Denies history of Glaucoma, Optic Neuritis Ear/Nose/Mouth/Throat Denies history of Chronic sinus problems/congestion, Middle ear problems Hematologic/Lymphatic Patient has history of Anemia - macrocytic, Lymphedema Cardiovascular Patient has history of Congestive Heart Failure, Deep Vein Thrombosis - left leg, Hypertension, Peripheral Venous Disease Endocrine Denies history of Type I Diabetes, Type II  Diabetes Genitourinary Denies history of End Stage Renal Disease Integumentary (Skin) Denies history of History of Burn Oncologic Denies history of Received Chemotherapy, Received Radiation Psychiatric Denies history of Anorexia/bulimia, Confinement Anxiety Hospitalization/Surgery History - cervical fusion. - lumbar surgery. - coronary stent placement. - lasik eye surgery. - hydrocele excision. BASHAN, CUTRONE ([id number]) [id number] Page 11 of 15 Medical A Surgical  History Notes nd Constitutional Symptoms (General Health) obesity Ear/Nose/Mouth/Throat hard of hearing Respiratory frozen diaphragm right Cardiovascular hyperlipidemia Musculoskeletal myositis, lumbar DDD, spinal stenosis, cervical facet joint syndrome Objective Constitutional no acute distress. Vitals Time Taken: 12:39 PM, Height: 71 in, Weight: 267 lbs, BMI: 37.2, Temperature: 97.9 F, Pulse: 69 bpm, Respiratory Rate: 18 breaths/min, Blood Pressure: 131/75 mmHg. Respiratory Normal work of breathing on room air. General Notes: 09/29/2022: The dorsal foot wound is a little bit larger today. The anterior tibial wound is about the same size with a little slough on the surface. The medial right leg wound continues to fill in. There are very few areas with any significant depth. There is still slough accumulation. Integumentary (Hair, Skin) Wound #1 status is Open. Original cause of wound was Gradually Appeared. The date acquired was: 12/07/2021. The wound has been in treatment 33 weeks. The wound is located on the Left,Dorsal Foot. The wound measures 0.4cm length x 0.6cm width x 0.1cm depth; 0.188cm^2 area and 0.019cm^3 volume. There is Fat Layer (Subcutaneous Tissue) exposed. There is no tunneling or undermining noted. There is a medium amount of serous drainage noted. The wound margin is distinct with the outline attached to the wound base. There is large (67-100%) red granulation within the  wound bed. There is a small (1-33%) amount of necrotic tissue within the wound bed including Adherent Slough. The periwound skin appearance exhibited: Dry/Scaly, Hemosiderin Staining. The periwound skin appearance did not exhibit: Excoriation, Maceration, Rubor, Erythema. Periwound temperature was noted as No Abnormality. The periwound has tenderness on palpation. Wound #3 status is Open. Original cause of wound was Gradually Appeared. The date acquired was: 12/07/2021. The wound has been in treatment 33 weeks. The wound is located on the Right,Medial Lower Leg. The wound measures 2.8cm length x 5cm width x 0.5cm depth; 10.996cm^2 area and 5.498cm^3 volume. There is Fat Layer (Subcutaneous Tissue) exposed. There is no tunneling or undermining noted. There is a medium amount of serosanguineous drainage noted. The wound margin is distinct with the outline attached to the wound base. There is medium (34-66%) red granulation within the wound bed. There is a medium (34-66%) amount of necrotic tissue within the wound bed including Adherent Slough. The periwound skin appearance had no abnormalities noted for texture. The periwound skin appearance exhibited: Hemosiderin Staining. The periwound skin appearance did not exhibit: Dry/Scaly, Maceration, Rubor, Erythema. Periwound temperature was noted as No Abnormality. The periwound has tenderness on palpation. Wound #6 status is Open. Original cause of wound was Gradually Appeared. The date acquired was: 05/12/2022. The wound has been in treatment 20 weeks. The wound is located on the Right,Anterior Lower Leg. The wound measures 0.5cm length x 0.5cm width x 0.2cm depth; 0.196cm^2 area and 0.039cm^3 volume. There is Fat Layer (Subcutaneous Tissue) exposed. There is no tunneling or undermining noted. There is a medium amount of serosanguineous drainage noted. The wound margin is epibole. There is small (1-33%) red granulation within the wound bed. There is a small  (1-33%) amount of necrotic tissue within the wound bed including Adherent Slough. The periwound skin appearance had no abnormalities noted for texture. The periwound skin appearance had no abnormalities noted for moisture. The periwound skin appearance exhibited: Hemosiderin Staining. The periwound skin appearance did not exhibit: Atrophie Blanche, Cyanosis, Ecchymosis, Mottled, Pallor, Rubor, Erythema. Periwound temperature was noted as No Abnormality. The periwound has tenderness on palpation. 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 There was a Selective/Open Wound Non-Viable Tissue Debridement with a total area of 0.24 sq cm performed by Fredirick Maudlin, MD. With the following instrument(s): Curette to remove Non-Viable tissue/material. Material removed includes Eschar and Slough and after achieving pain control using Lidocaine 4% T opical Solution. No specimens were taken. A time out was conducted at 13:10, prior to the start of the procedure. A Minimum amount of bleeding was controlled with Pressure. The procedure was tolerated well with a pain level of 0 throughout and a pain level of 0 following the procedure. Post Debridement Measurements: 0.4cm length x 0.6cm width x 0.1cm depth; 0.019cm^3 volume. Character of Wound/Ulcer Post Debridement is improved. Post procedure Diagnosis Wound #1:  Same as Pre-Procedure General Notes: Scribed for Dr. [name] by Baruch Gouty, RN. Pre-procedure diagnosis of Wound #1 is a Lymphedema located on the Left,Dorsal Foot . There was a Four Layer Compression Therapy Procedure by Baruch Gouty, RN. Post procedure Diagnosis Wound #1: Same as Pre-Procedure Wound #3 Pre-procedure diagnosis of Wound #3 is a Lymphedema located on the Right,Medial Lower Leg . There was a Excisional Skin/Subcutaneous Tissue Debridement with a total area of 14 sq cm performed by Fredirick Maudlin, MD. With the following instrument(s): Curette to remove Viable and Non-Viable tissue/material. Material removed includes Subcutaneous Tissue and Slough and after achieving pain control using Lidocaine 4% T opical Solution. No specimens were taken. A time out was conducted at 13:10, prior to the start of the procedure. A Minimum amount of bleeding was controlled with Pressure. The procedure was tolerated well with a pain level of 0 throughout and a pain level of 0 following the procedure. Post Debridement Measurements: 2.8cm length x 5cm width x 0.5cm depth; 5.498cm^3 volume. Character of Wound/Ulcer Post Debridement is improved. Post procedure Diagnosis Wound #3: Same as Pre-Procedure General Notes: Scribed for Dr. [name] by Baruch Gouty, RN. Pre-procedure diagnosis of Wound #3 is a Lymphedema located on the Right,Medial Lower Leg . There was a Four Layer Compression Therapy Procedure by Baruch Gouty, RN. 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 There was a Selective/Open Wound Non-Viable Tissue Debridement with a total area of 0.25 sq cm performed by Fredirick Maudlin, MD. With the following instrument(s): Curette to remove Non-Viable tissue/material. Material removed includes [facility] after achieving pain control using Lidocaine 4% Topical Solution. No  specimens were taken. A time out was conducted at 13:10, prior to the start of the procedure. A Minimum amount of bleeding was controlled with Pressure. The procedure was tolerated well with a pain level of 0 throughout and a pain level of 0 following the procedure. Post Debridement Measurements: 0.5cm length x 0.5cm width x 0.2cm depth; 0.039cm^3 volume. Character of Wound/Ulcer Post Debridement is improved. Post procedure Diagnosis Wound #6: Same as Pre-Procedure General Notes: Scribed for Dr. [name] by Baruch Gouty, RN. Plan Follow-up Appointments: Return Appointment in 1 week. - Dr. [name] Rm 1 Wednesday 3/13 @ 1230 pm Anesthetic: Wound #1 Left,Dorsal Foot: (In clinic) Topical Lidocaine 4% applied to wound  bed Wound #3 Right,Medial Lower Leg: (In clinic) Topical Lidocaine 4% applied to wound bed Wound #6 Right,Anterior Lower Leg: (In clinic) Topical Lidocaine 4% applied to wound bed Bathing/ Shower/ Hygiene: May shower with protection but do not get wound dressing(s) wet. Protect dressing(s) with water repellant cover (for example, large plastic bag) or a cast cover and may then take shower. Edema Control - Lymphedema / SCD / Other: Lymphedema Pumps. Use Lymphedema pumps on leg(s) 2-3 times a day for 45-60 minutes. If wearing any wraps or hose, do not remove them. Continue exercising as instructed. - use 1-2 times per day over wraps Elevate legs to the level of the heart or above for 30 minutes daily and/or when sitting for 3-4 times a day throughout the day. - throughout the day Avoid standing for long periods of time. Exercise regularly Home Health: New wound care orders this week; continue Home Health for wound care. May utilize formulary equivalent dressing for wound treatment orders unless otherwise specified. - Change right leg dressing only!! Dressing changes to be completed by [facility] on Monday / Wednesday / Friday except when patient has scheduled visit at [facility]. - pt seen in clinic on Wednesday Other [facility] Orders/Instructions: - [facility] WOUND #1: - Foot Wound Laterality: Dorsal, Left Peri-Wound Care: Zinc Oxide Ointment 30g tube 1 x Per Week/30 Days Discharge Instructions: Apply Zinc Oxide to periwound with each dressing change as needed for excoriation Peri-Wound Care: Sween Lotion (Moisturizing lotion) 1 x Per Week/30 Days Discharge Instructions: Apply moisturizing lotion as directed Prim Dressing: Sorbalgon AG Dressing, 4x4 (in/in) (Generic) 1 x Per Week/30 Days ary Discharge Instructions: or equivalent silver alginate. Apply to wound bed as instructed Secondary Dressing: ABD Pad, 5x9 (Generic) 1 x Per Week/30 Days Discharge Instructions: Apply over primary dressing as directed. Secondary Dressing: Woven Gauze Sponge, Non-Sterile 4x4 in (Generic) 1 x Per Week/30 Days Discharge Instructions: Apply over primary dressing as directed. Com pression Wrap: FourPress (4 layer compression wrap) (Generic) 1 x Per Week/30 Days Discharge Instructions: Apply four layer compression as directed. May also use Urgo K2 compression system as alternative. WOUND #3: - Lower Leg Wound Laterality: Right, Medial Peri-Wound Care: Triamcinolone 15 (g) 3 x Per Week/30 Days Discharge Instructions: Use triamcinolone 15 (g) as directed Peri-Wound Care: Zinc Oxide Ointment 30g tube 3 x Per Week/30 Days Discharge Instructions: Apply Zinc Oxide to excoriated periwound with each dressing change as needed MOHAMEDAMIN, RIEDEMANN ([id number]) [id number] Page 13 of 15 Peri-Wound Care: Sween Lotion (Moisturizing lotion) 3 x Per Week/30 Days Discharge Instructions: Apply moisturizing lotion as directed Prim Dressing: Santyl Ointment 3 x Per Week/30 Days ary Discharge Instructions: to wound bed mixed 1:1 with sodium thiosulfate ointment Prim Dressing: sodium thiosulfate ointment 3 x Per Week/30 Days ary Discharge Instructions: 1:1 mix with santyl to  wound bed Secondary Dressing: ABD Pad, 8x10 (Generic) 3 x Per Week/30 Days Discharge Instructions: Apply over primary dressing as directed. Secondary Dressing: Woven Gauze Sponge, Non-Sterile 4x4 in (Generic) 3 x Per Week/30 Days Discharge Instructions: Apply over primary dressing as directed. Com pression Wrap: FourPress (4 layer compression wrap) (Generic) 3 x Per Week/30 Days Discharge Instructions: Apply four layer compression as directed. May also use Urgo K2 compression system as alternative. WOUND #6: - Lower Leg Wound Laterality: Right, Anterior Peri-Wound Care: Triamcinolone 15 (g) 3 x Per Week/30 Days Discharge Instructions: Use triamcinolone 15 (g) ( in clinic) Peri-Wound Care: Zinc Oxide Ointment 30g tube 3 x Per Week/30 Days Discharge  Instructions: Apply Zinc Oxide to periwound with each dressing change as needed for excoriation Peri-Wound Care: Sween Lotion (Moisturizing lotion) 3 x Per Week/30 Days Discharge Instructions: Apply moisturizing lotion as directed Prim Dressing: sodium thiosulfate ointment 3 x Per Week/30 Days ary Discharge Instructions: to wound bed with each dressing change Secondary Dressing: ABD Pad, 5x9 (Generic) 3 x Per Week/30 Days Discharge Instructions: Apply over primary dressing as directed. Secondary Dressing: Woven Gauze Sponge, Non-Sterile 4x4 in (Generic) 3 x Per Week/30 Days Discharge Instructions: Apply over primary dressing as directed. Com pression Wrap: FourPress (4 layer compression wrap) (Generic) 3 x Per Week/30 Days Discharge Instructions: Apply four layer compression as directed. May also use Urgo K2 compression system as alternative. 09/29/2022: The dorsal foot wound is a little bit larger today. The anterior tibial wound is about the same size with a little slough on the surface. The medial right leg wound continues to fill in. There are very few areas with any significant depth. There is still slough accumulation. I used a curette to  debride slough from the left dorsal foot wound and the right anterior tibial wound. I debrided slough and subcutaneous tissue from the medial right leg wound. We will continue silver alginate to the left dorsal foot. Continue sodium thiosulfate compound to the right anterior tibial wound. Continue sodium thiosulfate compound along with Santyl to the right medial leg wound. Continue bilateral 4-layer compression. Follow-up in 1 week. Electronic Signature(s) Signed: 09/29/2022 2:15:53 PM By: Fredirick Maudlin MD FACS Entered By: Fredirick Maudlin on 09/29/2022 14:15:52 -------------------------------------------------------------------------------- HxROS Details Patient Name: Date of Service: KAIKEA, [name] 09/29/2022 12:30 PM Medical Record Number: [id number] Patient Account Number: [id number] Date of Birth/Sex: Treating RN: [birth year] (72 y.o. M) Primary Care Provider: PA [name], NO Other Clinician: Referring Provider: Treating Provider/Extender: [name] in Treatment: 33 Information Obtained From Patient Chart Constitutional Symptoms (General Health) Medical History: Past Medical History Notes: obesity Eyes Medical History: Positive for: Cataracts - right eye removed Negative for: Glaucoma; Optic Neuritis Ear/Nose/Mouth/Throat Medical History: Negative for: Chronic sinus problems/congestion; Middle ear problems Past Medical History Notes: hard of hearing Hematologic/Lymphatic Medical History: Positive for: Anemia - macrocytic; Lymphedema VALLEN, LINTHICUM ([id number]) [id number] Page 14 of 15 Respiratory Medical History: Past Medical History Notes: frozen diaphragm right Cardiovascular Medical History: Positive for: Congestive Heart Failure; Deep Vein Thrombosis - left leg; Hypertension; Peripheral Venous Disease Past Medical History Notes: hyperlipidemia Endocrine Medical History: Negative for: Type I Diabetes; Type II  Diabetes Genitourinary Medical History: Negative for: End Stage Renal Disease Integumentary (Skin) Medical History: Negative for: History of Burn Musculoskeletal Medical History: Past Medical History Notes: myositis, lumbar DDD, spinal stenosis, cervical facet joint syndrome Oncologic Medical History: Negative for: Received Chemotherapy; Received Radiation Psychiatric Medical History: Negative for: Anorexia/bulimia; Confinement Anxiety HBO Extended History Items Eyes: Cataracts Immunizations Pneumococcal Vaccine: Received Pneumococcal Vaccination: Yes Received Pneumococcal Vaccination On or After 60th Birthday: Yes Implantable Devices No devices added Hospitalization / Surgery History Type of Hospitalization/Surgery cervical fusion lumbar surgery coronary stent placement lasik eye surgery hydrocele excision Family and Social History Cancer: Yes - Mother; Diabetes: No; Heart Disease: Yes - Father,Mother; Hereditary Spherocytosis: No; Hypertension: Yes - Father,Siblings; Kidney Disease: No; Lung Disease: Yes - Siblings; Seizures: No; Stroke: No; Thyroid Problems: No; Tuberculosis: No; Former smoker - quit 1991; Marital Status - Married; Alcohol Use: Moderate; Drug Use: No History; Caffeine Use: Daily - coffee; Financial Concerns: No; Food, Clothing or Shelter Needs: No; Support System Lacking: No; Transportation Concerns: No  Electronic Signature(s) Signed: 09/29/2022 5:27:13 PM By: Fredirick Maudlin MD FACS Entered By: Fredirick Maudlin on 09/29/2022 14:06:14 [name] ([id number]) [id number] Page 15 of 15 -------------------------------------------------------------------------------- SuperBill Details Patient Name: Date of Service: QUINCEY, EILERTSON 09/29/2022 Medical Record Number: [id number] Patient Account Number: [id number] Date of Birth/Sex: Treating RN: March 05, 1951 (72 y.o. M) Primary Care Provider: PA [name], NO Other Clinician: Referring  Provider: Treating Provider/Extender: [name] in Treatment: 33 Diagnosis Coding ICD-10 Codes Code Description [phone] Non-pressure chronic ulcer of other part of right lower leg with fat layer exposed L97.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) I10 Essential (primary) hypertension I25.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 I89.0 Lymphedema, not elsewhere classified Z86.718 Personal history of other venous thrombosis and embolism Facility Procedures : CPT4 Code: [id number] Description: [id number] - DEB SUBQ TISSUE 20 SQ CM/< ICD-10 Diagnosis Description 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 Modifier: ed Quantity: 1 : CPT4 Code: [id number] Description: [id number] - DEBRIDE WOUND 1ST 20 SQ CM OR < ICD-10 Diagnosis Description L97.522 Non-pressure chronic ulcer of other part of left foot with fat layer exposed Modifier: Quantity: 1 Physician Procedures : CPT4 Code Description Modifier [id number] - WC PHYS LEVEL 4 - EST PT 25 ICD-10 Diagnosis Description 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ed L97.522 Non-pressure chronic ulcer of other part of left  foot with fat layer exposed 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) Quantity: 1 : [id number] - WC PHYS SUBQ TISS 20 SQ CM ICD-10 Diagnosis Description 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 Quantity: 1 : [id number] - WC PHYS DEBR WO ANESTH 20 SQ CM ICD-10 Diagnosis Description L97.522 Non-pressure chronic ulcer of other part of left foot with fat layer exposed Quantity: 1 Electronic Signature(s) Signed: 09/29/2022 2:16:30 PM By: Fredirick Maudlin MD FACS Entered By: Fredirick Maudlin on 09/29/2022 14:16:29

## 2022-10-01 NOTE — Progress Notes (Signed)
DEEGAN, MABEN ([id number]) [id number] Page 1 of 12 Visit Report for 09/29/2022 Arrival Information Details Patient Name: Date of Service: [name], [name] 09/29/2022 12:30 PM Medical Record Number: [id number] Patient Account Number: [id number] Date of Birth/Sex: Treating RN: [birth year] (72 y.o. [name] Primary Care [name] Reasons: PA [name], [place] Other Clinician: Referring [name]: Treating [name]/Extender: [name] in Treatment: 33 Visit Information History Since Last Visit Added or deleted any medications: No Patient Arrived: Walker Any new allergies or adverse reactions: No Arrival Time: 12:39 Had a fall or experienced change in No Accompanied By: self activities of daily living that may affect Transfer Assistance: None risk of falls: Patient Identification Verified: Yes Signs or symptoms of abuse/neglect since last visito No Secondary Verification Process Completed: Yes Hospitalized since last visit: No Patient Requires Transmission-Based Precautions: No Implantable device outside of the clinic excluding No Patient Has Alerts: Yes cellular tissue based products placed in the center Patient Alerts: R ABI= N/C, TBI= .75 since last visit: L ABI =N/C, TBI = .57 Has Dressing in Place as Prescribed: Yes Has Compression in Place as Prescribed: Yes Pain Present Now: Yes Electronic Signature(s) Signed: 09/29/2022 5:28:33 PM By: Baruch Gouty RN, BSN Entered By: Baruch Gouty on 09/29/2022 12:39:29 -------------------------------------------------------------------------------- Compression Therapy Details Patient Name: Date of Service: [name], [name] 09/29/2022 12:30 PM Medical Record Number: [id number] Patient Account Number: [id number] Date of Birth/Sex: Treating RN: 03/24/1951 (72 y.o. [name] Primary Care [name]: PA [name], [place] Other Clinician: Referring [name]: Treating [name]/Extender: [name] in Treatment: 33 Compression Therapy Performed for Wound Assessment: Wound #1 Left,Dorsal Foot Performed By: Clinician Baruch Gouty, RN Compression Type: Four Layer Post Procedure Diagnosis Same as Pre-procedure Electronic Signature(s) Signed: 09/29/2022 5:28:33 PM By: Baruch Gouty RN, BSN Entered By: Baruch Gouty on 09/29/2022 13:10:39 -------------------------------------------------------------------------------- Compression Therapy Details Patient Name: Date of Service: [name], [name] 09/29/2022 12:30 PM Medical Record Number: [id number] Patient Account Number: [id number] Date of Birth/Sex: Treating RN: April 03, 1951 (72 y.o. [name] Primary Care [name]: PA [name], [place] Other Clinician: Referring Kamonte Mcmichen: Treating Naliya Gish/Extender: [name] in Treatment: 33 Compression Therapy Performed for Wound Assessment: Wound #3 Right,Medial Lower Leg Performed By: Clinician Baruch Gouty, RN Compression Type: Four Layer Post Procedure Diagnosis Same as [name] ([id number]) [id number] Page 2 of 12 Electronic Signature(s) Signed: 09/29/2022 5:28:33 PM By: Baruch Gouty RN, BSN Entered By: Baruch Gouty on 09/29/2022 13:10:39 -------------------------------------------------------------------------------- Encounter Discharge Information Details Patient Name: Date of Service: [name], [name] 09/29/2022 12:30 PM Medical Record Number: [id number] Patient Account Number: [id number] Date of Birth/Sex: Treating RN: Feb 04, 1951 (72 y.o. [name] Primary Care [name]: PA [name], [place] Other Clinician: Referring [name]: Treating [name]/Extender: [name] in Treatment: 33 Encounter Discharge Information Items Post Procedure Vitals Discharge Condition: Stable Temperature (F): 97.9 Ambulatory Status: Walker Pulse (bpm): 69 Discharge Destination: Home Respiratory  Rate (breaths/min): 20 Transportation: Private Auto Blood Pressure (mmHg): 131/75 Accompanied By: self Schedule Follow-up Appointment: Yes Clinical Summary of Care: Patient Declined Electronic Signature(s) Signed: 09/29/2022 5:28:33 PM By: Baruch Gouty RN, BSN Entered By: Baruch Gouty on 09/29/2022 13:42:54 -------------------------------------------------------------------------------- Lower Extremity Assessment Details Patient Name: Date of Service: [name], [name] 09/29/2022 12:30 PM Medical Record Number: [id number] Patient Account Number: [id number] Date of Birth/Sex: Treating RN: 10-16-1950 (72 y.o. [name] Primary Care [name]: PA [name], [place] Other Clinician: Referring [name]: Treating [name]/Extender: [name] in Treatment: 33 Edema Assessment Assessed: [Left: No] [  Right: No] Edema: [Left: Yes] [Right: Yes] Calf Left: Right: Point of Measurement: From Medial Instep 42 cm 40.3 cm Ankle Left: Right: Point of Measurement: From Medial Instep 23.4 cm 26.3 cm Vascular Assessment Pulses: Dorsalis Pedis Palpable: [Left:Yes] [Right:Yes] Electronic Signature(s) Signed: 09/29/2022 5:28:33 PM By: Baruch Gouty RN, BSN Entered By: Baruch Gouty on 09/29/2022 12:48:49 Multi Wound Chart Details -------------------------------------------------------------------------------- [name] ([id number]) [id number] Page 3 of 12 Patient Name: Date of Service: [name], [name] 09/29/2022 12:30 PM Medical Record Number: [id number] Patient Account Number: [id number] Date of Birth/Sex: Treating RN: Dec 20, 1950 (72 y.o. M) Primary Care [name]: PA [name], [place] Other Clinician: Referring Terriona Horlacher: Treating [name]/Extender: [name], [name] in Treatment: 33 Vital Signs Height(in): 71 Pulse(bpm): 26 Weight(lbs): 71 Blood Pressure(mmHg): 131/75 Body Mass Index(BMI): 37.2 Temperature(F):  97.9 Respiratory Rate(breaths/min): 18 Wound Assessments Wound Number: '1 3 6 '$ Photos: Left, Dorsal Foot Right, Medial Lower Leg Right, Anterior Lower Leg Wound Location: Gradually Appeared Gradually Appeared Gradually Appeared Wounding Event: Lymphedema Lymphedema Calcinosis Primary Etiology: Cataracts, Anemia, Lymphedema, Cataracts, Anemia, Lymphedema, Cataracts, Anemia, Lymphedema, Comorbid History: Congestive Heart Failure, Deep Vein Congestive Heart Failure, Deep Vein Congestive Heart Failure, Deep Vein Thrombosis, Hypertension, Peripheral Thrombosis, Hypertension, Peripheral Thrombosis, Hypertension, Peripheral Venous Disease Venous Disease Venous Disease 12/07/2021 12/07/2021 05/12/2022 Date Acquired: 33 33 20 Weeks of Treatment: Open Open Open Wound Status: No No No Wound Recurrence: 0.4x0.6x0.1 2.8x5x0.5 0.5x0.5x0.2 Measurements L x W x D (cm) 0.188 10.996 0.196 A (cm) : rea 0.019 5.498 0.039 Volume (cm) : 95.00% -105.90% 16.90% % Reduction in A rea: 98.70% -157.40% -62.50% % Reduction in Volume: Full Thickness Without Exposed Full Thickness With Exposed Support Full Thickness Without Exposed Classification: Support Structures Structures Support Structures Medium Medium Medium Exudate A mount: Serous Serosanguineous Serosanguineous Exudate Type: amber red, brown red, brown Exudate Color: Distinct, outline attached Distinct, outline attached Epibole Wound Margin: Large (67-100%) Medium (34-66%) Small (1-33%) Granulation A mount: Red Red Red Granulation Quality: Small (1-33%) Medium (34-66%) Small (1-33%) Necrotic A mount: Fat Layer (Subcutaneous Tissue): Yes Fat Layer (Subcutaneous Tissue): Yes Fat Layer (Subcutaneous Tissue): Yes Exposed Structures: Fascia: No Fascia: No Fascia: No Tendon: No Tendon: No Tendon: No Muscle: No Muscle: No Muscle: No Joint: No Joint: No Joint: No Bone: No Bone: No Bone: No Small (1-33%) Small (1-33%)  None Epithelialization: Debridement - Selective/Open Wound Debridement - Excisional Debridement - Selective/Open Wound Debridement: Pre-procedure Verification/Time Out 13:10 13:10 13:10 Taken: Lidocaine 4% Topical Solution Lidocaine 4% Topical Solution Lidocaine 4% Topical Solution Pain Control: Necrotic/Eschar, Slough Subcutaneous, [employer] Tissue Debrided: Non-Viable Tissue Skin/Subcutaneous Tissue Non-Viable Tissue Level: 0.24 14 0.25 Debridement A (sq cm): rea Curette Curette Curette Instrument: Minimum Minimum Minimum Bleeding: Pressure Pressure Pressure Hemostasis A chieved: 0 0 0 Procedural Pain: 0 0 0 Post Procedural Pain: Procedure was tolerated well Procedure was tolerated well Procedure was tolerated well Debridement Treatment Response: 0.4x0.6x0.1 2.8x5x0.5 0.5x0.5x0.2 Post Debridement Measurements L x W x D (cm) 0.019 5.498 0.039 Post Debridement Volume: (cm) Excoriation: No Excoriation: No Excoriation: No Periwound Skin Texture: Induration: No Callus: No Crepitus: No Rash: No Scarring: No Dry/Scaly: Yes Maceration: No Maceration: No Periwound Skin Moisture: Maceration: No Dry/Scaly: No Dry/Scaly: No Hemosiderin Staining: Yes Hemosiderin Staining: Yes Hemosiderin Staining: Yes Periwound Skin ColorNEHAL, WERLEY ([id number]) [id number] Page 4 of 12 Erythema: No Erythema: No Atrophie Blanche: No Rubor: No Rubor: No Cyanosis: No Ecchymosis: No Erythema: No Mottled: No Pallor: No Rubor: No No Abnormality No Abnormality No Abnormality Temperature: Yes Yes Yes Tenderness  on Palpation: Compression Therapy Compression Therapy Debridement Procedures Performed: Debridement Debridement Treatment Notes Wound #1 (Foot) Wound Laterality: Dorsal, Left Cleanser Peri-Wound Care Zinc Oxide Ointment 30g tube Discharge Instruction: Apply Zinc Oxide to periwound with each dressing change as needed for  excoriation Sween Lotion (Moisturizing lotion) Discharge Instruction: Apply moisturizing lotion as directed Topical Primary Dressing Sorbalgon AG Dressing, 4x4 (in/in) Discharge Instruction: or equivalent silver alginate. Apply to wound bed as instructed Secondary Dressing ABD Pad, 5x9 Discharge Instruction: Apply over primary dressing as directed. Woven Gauze Sponge, Non-Sterile 4x4 in Discharge Instruction: Apply over primary dressing as directed. Secured With Compression Wrap FourPress (4 layer compression wrap) Discharge Instruction: Apply four layer compression as directed. May also use Urgo K2 compression system as alternative. Compression Stockings Add-Ons Wound #3 (Lower Leg) Wound Laterality: Right, Medial Cleanser Peri-Wound Care Triamcinolone 15 (g) Discharge Instruction: Use triamcinolone 15 (g) as directed Zinc Oxide Ointment 30g tube Discharge Instruction: Apply Zinc Oxide to excoriated periwound with each dressing change as needed Sween Lotion (Moisturizing lotion) Discharge Instruction: Apply moisturizing lotion as directed Topical Primary Dressing Santyl Ointment Discharge Instruction: to wound bed mixed 1:1 with sodium thiosulfate ointment sodium thiosulfate ointment Discharge Instruction: 1:1 mix with santyl to wound bed Secondary Dressing ABD Pad, 8x10 Discharge Instruction: Apply over primary dressing as directed. Woven Gauze Sponge, Non-Sterile 4x4 in Discharge Instruction: Apply over primary dressing as directed. Secured With Compression Wrap FourPress (4 layer compression wrap) [name], [name] ([id number]) [id number] Page 5 of 12 Discharge Instruction: Apply four layer compression as directed. May also use Urgo K2 compression system as alternative. Compression Stockings Add-Ons Wound #6 (Lower Leg) Wound Laterality: Right, Anterior Cleanser Peri-Wound Care Triamcinolone 15 (g) Discharge Instruction: Use triamcinolone 15  (g) ( in clinic) Zinc Oxide Ointment 30g tube Discharge Instruction: Apply Zinc Oxide to periwound with each dressing change as needed for excoriation Sween Lotion (Moisturizing lotion) Discharge Instruction: Apply moisturizing lotion as directed Topical Primary Dressing sodium thiosulfate ointment Discharge Instruction: to wound bed with each dressing change Secondary Dressing ABD Pad, 5x9 Discharge Instruction: Apply over primary dressing as directed. Woven Gauze Sponge, Non-Sterile 4x4 in Discharge Instruction: Apply over primary dressing as directed. Secured With Compression Wrap FourPress (4 layer compression wrap) Discharge Instruction: Apply four layer compression as directed. May also use Urgo K2 compression system as alternative. Compression Stockings Add-Ons Electronic Signature(s) Signed: 09/29/2022 1:56:34 PM By: Fredirick Maudlin MD FACS Entered By: Fredirick Maudlin on 09/29/2022 13:56:33 -------------------------------------------------------------------------------- Multi-Disciplinary Care Plan Details Patient Name: Date of Service: [name], [name] 09/29/2022 12:30 PM Medical Record Number: [id number] Patient Account Number: [id number] Date of Birth/Sex: Treating RN: [birth year] (72 y.o. [name] Primary Care Kamali Nephew: PA [name], [place] Other Clinician: Referring Tuyet Bader: Treating Lyndia Bury/Extender: [name] in Treatment: [place] reviewed with physician Active Inactive Venous Leg Ulcer Nursing Diagnoses: Knowledge deficit related to disease process and management Potential for venous Insuffiency (use before diagnosis confirmed) Goals: Patient will maintain optimal edema control Date Initiated: 02/17/2022 Target Resolution Date: 10/27/2022 Goal Status: Active Interventions: TREYVONNE, FELIPE ([id number]) [id number] Page 6 of 12 Assess peripheral edema status every visit. Compression as  ordered Provide education on venous insufficiency Treatment Activities: Non-invasive vascular studies : 02/17/2022 Therapeutic compression applied : 02/17/2022 Notes: Wound/Skin Impairment Nursing Diagnoses: Impaired tissue integrity Knowledge deficit related to ulceration/compromised skin integrity Goals: Patient/caregiver will verbalize understanding of skin care regimen Date Initiated: 02/17/2022 Target Resolution Date: 10/27/2022 Goal Status: Active Ulcer/skin breakdown will have a volume reduction of 30% by  week 4 Date Initiated: 02/17/2022 Date Inactivated: 03/10/2022 Target Resolution Date: 03/10/2022 Unmet Reason: infection being treated, Goal Status: Unmet waiting on derm appte Ulcer/skin breakdown will have a volume reduction of 50% by week 8 Date Initiated: 03/10/2022 Date Inactivated: 04/14/2022 Target Resolution Date: 04/07/2022 Goal Status: Unmet Unmet Reason: calcinosis Ulcer/skin breakdown will have a volume reduction of 80% by week 12 Date Initiated: 04/14/2022 Date Inactivated: 05/05/2022 Target Resolution Date: 05/05/2022 Unmet Reason: infection, chronic Goal Status: Unmet condition Interventions: Assess patient/caregiver ability to obtain necessary supplies Assess patient/caregiver ability to perform ulcer/skin care regimen upon admission and as needed Assess ulceration(s) every visit Provide education on ulcer and skin care Treatment Activities: Skin care regimen initiated : 02/17/2022 Topical wound management initiated : 02/17/2022 Notes: Electronic Signature(s) Signed: 09/29/2022 5:28:33 PM By: Baruch Gouty RN, BSN Entered By: Baruch Gouty on 09/29/2022 13:04:38 -------------------------------------------------------------------------------- Pain Assessment Details Patient Name: Date of Service: [name], [name] 09/29/2022 12:30 PM Medical Record Number: [id number] Patient Account Number: [id number] Date of Birth/Sex: Treating RN: Dec 20, 1950 (72 y.o. [name] Primary Care Anglia Blakley: PA [name], [place] Other Clinician: Referring Khalaya Mcgurn: Treating Etienne Mowers/Extender: [name] in Treatment: 33 Active Problems Location of Pain Severity and Description of Pain Patient Has Paino Yes Site Locations Pain LocationJAEL, KRAUSE ([id number]) [id number] Page 7 of 12 Pain Location: Generalized Pain, Pain in Ulcers With Dressing Change: Yes Duration of the Pain. Constant / Intermittento Intermittent Rate the pain. Current Pain Level: 0 Worst Pain Level: 7 Least Pain Level: 0 Character of Pain Describe the Pain: Aching Pain Management and Medication Current Pain Management: Medication: Yes Activity: Yes How does your wound impact your activities of daily livingo Sleep: Yes Notes reports pain in feet while in bed Electronic Signature(s) Signed: 09/29/2022 5:28:33 PM By: Baruch Gouty RN, BSN Entered By: Baruch Gouty on 09/29/2022 12:41:13 -------------------------------------------------------------------------------- Patient/Caregiver Education Details Patient Name: Date of Service: [name] 3/6/2024andnbsp12:30 PM Medical Record Number: [id number] Patient Account Number: [id number] Date of Birth/Gender: Treating RN: 11-20-1950 (72 y.o. [name] Primary Care Physician: PA [name], [place] Other Clinician: Referring Physician: Treating Physician/Extender: [name] in Treatment: 58 Education Assessment Education Provided To: Patient Education Topics Provided Venous: Methods: Explain/Verbal Responses: Reinforcements needed, State content correctly Wound/Skin Impairment: Methods: Explain/Verbal Responses: Reinforcements needed, State content correctly Electronic Signature(s) Signed: 09/29/2022 5:28:33 PM By: Baruch Gouty RN, BSN Entered By: Baruch Gouty on 09/29/2022 13:04:59 [name] ([id number])  [id number] Page 8 of 12 -------------------------------------------------------------------------------- Wound Assessment Details Patient Name: Date of Service: [name], [name] 09/29/2022 12:30 PM Medical Record Number: [id number] Patient Account Number: [id number] Date of Birth/Sex: Treating RN: 10/24/1950 (72 y.o. [name] Primary Care Hendricks Schwandt: PA [name], [place] Other Clinician: Referring Shante Archambeault: Treating Lashonda Sonneborn/Extender: [name] in Treatment: 33 Wound Status Wound Number: 1 Primary Lymphedema Etiology: Wound Location: Left, Dorsal Foot Wound Open Wounding Event: Gradually Appeared Status: Date Acquired: 12/07/2021 Comorbid Cataracts, Anemia, Lymphedema, Congestive Heart Failure, Deep Weeks Of Treatment: 33 History: Vein Thrombosis, Hypertension, Peripheral Venous Disease Clustered Wound: No Photos Wound Measurements Length: (cm) 0.4 Width: (cm) 0.6 Depth: (cm) 0.1 Area: (cm) 0.188 Volume: (cm) 0.019 % Reduction in Area: 95% % Reduction in Volume: 98.7% Epithelialization: Small (1-33%) Tunneling: No Undermining: No Wound Description Classification: Full Thickness Without Exposed Suppor Wound Margin: Distinct, outline attached Exudate Amount: Medium Exudate Type: Serous Exudate Color: amber t Structures Foul Odor After Cleansing: No Slough/Fibrino Yes Wound Bed Granulation Amount: Large (67-100%) Exposed Structure Granulation  Quality: Red Fascia Exposed: No Necrotic Amount: Small (1-33%) Fat Layer (Subcutaneous Tissue) Exposed: Yes Necrotic Quality: Adherent Slough Tendon Exposed: No Muscle Exposed: No Joint Exposed: No Bone Exposed: No Periwound Skin Texture Texture Color No Abnormalities Noted: No No Abnormalities Noted: No Excoriation: No Erythema: No Hemosiderin Staining: Yes Moisture Rubor: No No Abnormalities Noted: No Dry / Scaly: Yes Temperature / Pain Maceration: No Temperature:  No Abnormality Tenderness on Palpation: Yes Treatment Notes Wound #1 (Foot) Wound Laterality: Dorsal, Left Cleanser Peri-Wound Care Zinc Oxide Ointment 30g tube Discharge Instruction: Apply Zinc Oxide to periwound with each dressing change as needed for excoriation AGGIE, HOUSEHOLDER ([id number]) [id number] Page 9 of 12 Sween Lotion (Moisturizing lotion) Discharge Instruction: Apply moisturizing lotion as directed Topical Primary Dressing Sorbalgon AG Dressing, 4x4 (in/in) Discharge Instruction: or equivalent silver alginate. Apply to wound bed as instructed Secondary Dressing ABD Pad, 5x9 Discharge Instruction: Apply over primary dressing as directed. Woven Gauze Sponge, Non-Sterile 4x4 in Discharge Instruction: Apply over primary dressing as directed. Secured With Compression Wrap FourPress (4 layer compression wrap) Discharge Instruction: Apply four layer compression as directed. May also use Urgo K2 compression system as alternative. Compression Stockings Add-Ons Electronic Signature(s) Signed: 09/29/2022 5:28:33 PM By: Baruch Gouty RN, BSN Entered By: Baruch Gouty on 09/29/2022 13:02:29 -------------------------------------------------------------------------------- Wound Assessment Details Patient Name: Date of Service: [name], [name] 09/29/2022 12:30 PM Medical Record Number: [id number] Patient Account Number: [id number] Date of Birth/Sex: Treating RN: 11/14/1950 (72 y.o. [name] Primary Care Virgil Lightner: PA [name], [place] Other Clinician: Referring Jorja Empie: Treating Zea Kostka/Extender: [name] in Treatment: 33 Wound Status Wound Number: 3 Primary Lymphedema Etiology: Wound Location: Right, Medial Lower Leg Wound Open Wounding Event: Gradually Appeared Status: Date Acquired: 12/07/2021 Comorbid Cataracts, Anemia, Lymphedema, Congestive Heart Failure, Deep Weeks Of Treatment: 33 History: Vein Thrombosis,  Hypertension, Peripheral Venous Disease Clustered Wound: No Photos Wound Measurements Length: (cm) 2.8 Width: (cm) 5 Depth: (cm) 0.5 Area: (cm) 10.996 Volume: (cm) 5.498 % Reduction in Area: -105.9% % Reduction in Volume: -157.4% Epithelialization: Small (1-33%) Tunneling: No Undermining: No Wound Description Classification: Full Thickness With Exposed Support Structures Wound Margin: Distinct, outline attached Vassell, [name] ([id number]) Exudate Amount: Medium Exudate Type: Serosanguineous Exudate Color: red, brown Foul Odor After Cleansing: No Slough/Fibrino Yes [id number] Page 10 of 12 Wound Bed Granulation Amount: Medium (34-66%) Exposed Structure Granulation Quality: Red Fascia Exposed: No Necrotic Amount: Medium (34-66%) Fat Layer (Subcutaneous Tissue) Exposed: Yes Necrotic Quality: Adherent Slough Tendon Exposed: No Muscle Exposed: No Joint Exposed: No Bone Exposed: No Periwound Skin Texture Texture Color No Abnormalities Noted: Yes No Abnormalities Noted: No Erythema: No Moisture Hemosiderin Staining: Yes No Abnormalities Noted: No Rubor: No Dry / Scaly: No Maceration: No Temperature / Pain Temperature: No Abnormality Tenderness on Palpation: Yes Treatment Notes Wound #3 (Lower Leg) Wound Laterality: Right, Medial Cleanser Peri-Wound Care Triamcinolone 15 (g) Discharge Instruction: Use triamcinolone 15 (g) as directed Zinc Oxide Ointment 30g tube Discharge Instruction: Apply Zinc Oxide to excoriated periwound with each dressing change as needed Sween Lotion (Moisturizing lotion) Discharge Instruction: Apply moisturizing lotion as directed Topical Primary Dressing Santyl Ointment Discharge Instruction: to wound bed mixed 1:1 with sodium thiosulfate ointment sodium thiosulfate ointment Discharge Instruction: 1:1 mix with santyl to wound bed Secondary Dressing ABD Pad, 8x10 Discharge Instruction: Apply over primary  dressing as directed. Woven Gauze Sponge, Non-Sterile 4x4 in Discharge Instruction: Apply over primary dressing as directed. Secured With Compression Wrap FourPress (4 layer compression wrap) Discharge Instruction: Apply four layer  compression as directed. May also use Urgo K2 compression system as alternative. Compression Stockings Add-Ons Electronic Signature(s) Signed: 09/29/2022 5:28:33 PM By: Baruch Gouty RN, BSN Entered By: Baruch Gouty on 09/29/2022 13:03:23 -------------------------------------------------------------------------------- Wound Assessment Details Patient Name: Date of Service: [name], KILLINGER 09/29/2022 12:30 PM Medical Record Number: [id number] Patient Account Number: [id number] Date of Birth/Sex: Treating RN: 29-Sep-1950 (72 y.o. [name] [place], [place] ([id number]) [id number] Page 11 of 12 Primary Care Mckynzi Cammon: PA [name], [place] Other Clinician: Referring Lindzy Rupert: Treating Zamiya Dillard/Extender: [name] in Treatment: 33 Wound Status Wound Number: 6 Primary Calcinosis Etiology: Wound Location: Right, Anterior Lower Leg Wound Open Wounding Event: Gradually Appeared Status: Date Acquired: 05/12/2022 Comorbid Cataracts, Anemia, Lymphedema, Congestive Heart Failure, Deep Weeks Of Treatment: 20 History: Vein Thrombosis, Hypertension, Peripheral Venous Disease Clustered Wound: No Photos Wound Measurements Length: (cm) 0.5 Width: (cm) 0.5 Depth: (cm) 0.2 Area: (cm) 0.196 Volume: (cm) 0.039 % Reduction in Area: 16.9% % Reduction in Volume: -62.5% Epithelialization: None Tunneling: No Undermining: No Wound Description Classification: Full Thickness Without Exposed Suppor Wound Margin: Epibole Exudate Amount: Medium Exudate Type: Serosanguineous Exudate Color: red, brown t Structures Foul Odor After Cleansing: No Slough/Fibrino Yes Wound Bed Granulation Amount: Small (1-33%) Exposed  Structure Granulation Quality: Red Fascia Exposed: No Necrotic Amount: Small (1-33%) Fat Layer (Subcutaneous Tissue) Exposed: Yes Necrotic Quality: Adherent Slough Tendon Exposed: No Muscle Exposed: No Joint Exposed: No Bone Exposed: No Periwound Skin Texture Texture Color No Abnormalities Noted: Yes No Abnormalities Noted: No Atrophie Blanche: No Moisture Cyanosis: No No Abnormalities Noted: Yes Ecchymosis: No Erythema: No Hemosiderin Staining: Yes Mottled: No Pallor: No Rubor: No Temperature / Pain Temperature: No Abnormality Tenderness on Palpation: Yes Treatment Notes Wound #6 (Lower Leg) Wound Laterality: Right, Anterior Cleanser Peri-Wound Care Triamcinolone 15 (g) Discharge Instruction: Use triamcinolone 15 (g) ( in clinic) [name] ([id number]) [id number] Page 12 of 12 Zinc Oxide Ointment 30g tube Discharge Instruction: Apply Zinc Oxide to periwound with each dressing change as needed for excoriation Sween Lotion (Moisturizing lotion) Discharge Instruction: Apply moisturizing lotion as directed Topical Primary Dressing sodium thiosulfate ointment Discharge Instruction: to wound bed with each dressing change Secondary Dressing ABD Pad, 5x9 Discharge Instruction: Apply over primary dressing as directed. Woven Gauze Sponge, Non-Sterile 4x4 in Discharge Instruction: Apply over primary dressing as directed. Secured With Compression Wrap FourPress (4 layer compression wrap) Discharge Instruction: Apply four layer compression as directed. May also use Urgo K2 compression system as alternative. Compression Stockings Add-Ons Electronic Signature(s) Signed: 09/29/2022 5:28:33 PM By: Baruch Gouty RN, BSN Entered By: Baruch Gouty on 09/29/2022 13:03:55 -------------------------------------------------------------------------------- Vitals Details Patient Name: Date of Service: [name] 09/29/2022 12:30 PM Medical Record  Number: [id number] Patient Account Number: [id number] Date of Birth/Sex: Treating RN: 06-Jul-1951 (72 y.o. [name] Primary Care Chayson Charters: PA [name], [place] Other Clinician: Referring Mayo Owczarzak: Treating Jkwon Treptow/Extender: [name] in Treatment: 33 Vital Signs Time Taken: 12:39 Temperature (F): 97.9 Height (in): 71 Pulse (bpm): 69 Weight (lbs): 267 Respiratory Rate (breaths/min): 18 Body Mass Index (BMI): 37.2 Blood Pressure (mmHg): 131/75 Reference Range: 80 - 120 mg / dl Electronic Signature(s) Signed: 09/29/2022 5:28:33 PM By: Baruch Gouty RN, BSN Entered By: Baruch Gouty on 09/29/2022 12:40:02

## 2022-10-04 DIAGNOSIS — L942 Calcinosis cutis: Secondary | ICD-10-CM | POA: Diagnosis not present

## 2022-10-04 DIAGNOSIS — I872 Venous insufficiency (chronic) (peripheral): Secondary | ICD-10-CM | POA: Diagnosis not present

## 2022-10-04 DIAGNOSIS — L03116 Cellulitis of left lower limb: Secondary | ICD-10-CM | POA: Diagnosis not present

## 2022-10-04 DIAGNOSIS — I10 Essential (primary) hypertension: Secondary | ICD-10-CM | POA: Diagnosis not present

## 2022-10-04 DIAGNOSIS — L97812 Non-pressure chronic ulcer of other part of right lower leg with fat layer exposed: Secondary | ICD-10-CM | POA: Diagnosis not present

## 2022-10-04 DIAGNOSIS — L97522 Non-pressure chronic ulcer of other part of left foot with fat layer exposed: Secondary | ICD-10-CM | POA: Diagnosis not present

## 2022-10-06 ENCOUNTER — Encounter (HOSPITAL_BASED_OUTPATIENT_CLINIC_OR_DEPARTMENT_OTHER): Payer: Medicare Other | Admitting: Internal Medicine

## 2022-10-06 DIAGNOSIS — I872 Venous insufficiency (chronic) (peripheral): Secondary | ICD-10-CM | POA: Diagnosis not present

## 2022-10-06 DIAGNOSIS — L942 Calcinosis cutis: Secondary | ICD-10-CM | POA: Diagnosis not present

## 2022-10-06 DIAGNOSIS — L97522 Non-pressure chronic ulcer of other part of left foot with fat layer exposed: Secondary | ICD-10-CM | POA: Diagnosis not present

## 2022-10-06 DIAGNOSIS — L97812 Non-pressure chronic ulcer of other part of right lower leg with fat layer exposed: Secondary | ICD-10-CM

## 2022-10-06 DIAGNOSIS — I251 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: Secondary | ICD-10-CM | POA: Diagnosis not present

## 2022-10-06 DIAGNOSIS — I890 Lymphedema, not elsewhere classified: Secondary | ICD-10-CM | POA: Diagnosis not present

## 2022-10-06 NOTE — Progress Notes (Addendum)
[name] ([id number]) [id number] Page 1 of 15 Visit Report for 10/06/2022 Chief Complaint Document Details Patient Name: Date of Service: [name], [name] 10/06/2022 12:30 PM Medical Record Number: [id number] Patient Account Number: [id number] Date of Birth/Sex: Treating RN: Apr 14, 1951 (72 y.o. M) Primary Care Provider: PA [name], NO Other Clinician: Referring Provider: Treating Provider/Extender: Sandra Cockayne in Treatment: 34 Information Obtained from: Patient Chief Complaint Patient seen for complaints of Non-Healing Wounds. Electronic Signature(s) Signed: 10/07/2022 3:34:03 PM By: [name] DO Entered By: [name] on 10/06/2022 13:40:26 -------------------------------------------------------------------------------- Debridement Details Patient Name: Date of Service: [name], [name] 10/06/2022 12:30 PM Medical Record Number: [id number] Patient Account Number: [id number] Date of Birth/Sex: Treating RN: 1951-03-03 (72 y.o. [name] Primary Care Provider: PA [name], NO Other Clinician: Referring Provider: Treating Provider/Extender: Sandra Cockayne in Treatment: 34 Debridement Performed for Assessment: Wound #3 Right,Medial Lower Leg Performed By: Physician [name], DO Debridement Type: Debridement Level of Consciousness (Pre-procedure): Awake and Alert Pre-procedure Verification/Time Out Yes - 13:05 Taken: Start Time: 13:05 Pain Control: Lidocaine 4% T opical Solution T Area Debrided (L x W): otal 2.5 (cm) x 5.6 (cm) = 14 (cm) Tissue and other material debrided: Viable, Non-Viable, Slough, Subcutaneous, Slough Level: Skin/Subcutaneous Tissue Debridement Description: Excisional Instrument: Curette Bleeding: Minimum Hemostasis Achieved: Pressure Procedural Pain: 0 Post Procedural Pain: 0 Response to Treatment: Procedure was tolerated well Level of Consciousness (Post- Awake  and Alert procedure): Post Debridement Measurements of Total Wound Length: (cm) 5.2 Width: (cm) 5.6 Depth: (cm) 0.4 Volume: (cm) 9.148 Character of Wound/Ulcer Post Debridement: Stable Post Procedure Diagnosis Same as Pre-procedure Electronic Signature(s) Signed: 10/07/2022 3:34:03 PM By: [name] DO Signed: 11/11/2022 1:55:51 PM By: Brenton Grills Entered By: Brenton Grills on 10/06/2022 13:08:44 [name] ([id number]) [id number] Page 2 of 15 -------------------------------------------------------------------------------- Debridement Details Patient Name: Date of Service: [name], [name] 10/06/2022 12:30 PM Medical Record Number: [id number] Patient Account Number: [id number] Date of Birth/Sex: Treating RN: November 08, 1950 (72 y.o. [name] Primary Care Provider: PA [name], NO Other Clinician: Referring Provider: Treating Provider/Extender: Sandra Cockayne in Treatment: 34 Debridement Performed for Assessment: Wound #1 Left,Dorsal Foot Performed By: Physician [name], DO Debridement Type: Debridement Level of Consciousness (Pre-procedure): Awake and Alert Pre-procedure Verification/Time Out Yes - 13:05 Taken: Start Time: 13:05 Pain Control: Lidocaine 4% T opical Solution T Area Debrided (L x W): otal 0.5 (cm) x 0.6 (cm) = 0.3 (cm) Tissue and other material debrided: Viable, Non-Viable, Slough, Subcutaneous, Slough Level: Skin/Subcutaneous Tissue Debridement Description: Excisional Instrument: Curette Bleeding: Minimum Hemostasis Achieved: Pressure Procedural Pain: 0 Post Procedural Pain: 0 Response to Treatment: Procedure was tolerated well Level of Consciousness (Post- Awake and Alert procedure): Post Debridement Measurements of Total Wound Length: (cm) 0.5 Width: (cm) 0.6 Depth: (cm) 0.1 Volume: (cm) 0.024 Character of Wound/Ulcer Post Debridement: Stable Post Procedure Diagnosis Same as  Pre-procedure Electronic Signature(s) Signed: 10/07/2022 3:34:03 PM By: [name] DO Signed: 11/11/2022 1:55:51 PM By: Brenton Grills Entered By: Brenton Grills on 10/06/2022 13:09:42 -------------------------------------------------------------------------------- Debridement Details Patient Name: Date of Service: [name], [name] 10/06/2022 12:30 PM Medical Record Number: [id number] Patient Account Number: [id number] Date of Birth/Sex: Treating RN: [birth year] (72 y.o. [name] Primary Care Provider: PA [name], NO Other Clinician: Referring Provider: Treating Provider/Extender: Sandra Cockayne in Treatment: 34 Debridement Performed for Assessment: Wound #6 Right,Anterior Lower Leg Performed By: Physician [name], DO Debridement Type: Debridement Level of Consciousness (Pre-procedure): Awake and Alert Pre-procedure  Verification/Time Out Yes - 13:05 Taken: Start Time: 13:05 Pain Control: Lidocaine 4% T opical Solution T Area Debrided (L x W): otal 0.6 (cm) x 0.6 (cm) = 0.36 (cm) Tissue and other material debrided: Viable, Non-Viable, Slough, Subcutaneous, Slough Level: Skin/Subcutaneous Tissue Debridement Description: Excisional Instrument: Curette Bleeding: Minimum Hemostasis Achieved: Pressure Procedural Pain: 0 Post Procedural Pain: 0 Response to Treatment: Procedure was tolerated well Level of Consciousness (Post- Awake and Alert [name] ([id number]) [id number] Page 3 of 15 Awake and Alert procedure): Post Debridement Measurements of Total Wound Length: (cm) 0.6 Width: (cm) 0.6 Depth: (cm) 0.3 Volume: (cm) 0.085 Character of Wound/Ulcer Post Debridement: Stable Post Procedure Diagnosis Same as Pre-procedure Electronic Signature(s) Signed: 10/07/2022 3:34:03 PM By: [name] DO Signed: 11/11/2022 1:55:51 PM By: Brenton Grills Entered By: Brenton Grills on 10/06/2022  13:10:34 -------------------------------------------------------------------------------- HPI Details Patient Name: Date of Service: [name] 10/06/2022 12:30 PM Medical Record Number: [id number] Patient Account Number: [id number] Date of Birth/Sex: Treating RN: 07-29-1950 (72 y.o. M) Primary Care Provider: PA [name], NO Other Clinician: Referring Provider: Treating Provider/Extender: Sandra Cockayne in Treatment: 68 History of Present Illness HPI Description: ADMISSION 02/09/2022 This is a 72 year old man who recently moved to [place] from [place]. He has a history of of coronary artery disease and prior left popliteal vein DVT . He is not diabetic and does not smoke. 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 He has had multiple spinal surgeries and has limited mobility. He primarily uses a motorized wheelchair. He has had lymphedema for many years and does have lymphedema pumps although he says he does not use them frequently. He has wounds on his bilateral lower extremities. He was receiving care in [place] for these prior to his move. They were apparently packing his wounds with Betadine soaked gauze. He has worn compression wraps in the past but not recently. He did say that they helped tremendously. In clinic today, his right ABI is noncompressible but has good signals; the left ABI was 1.17. No formal vascular studies have been performed. On his left dorsal foot, there is a circular lesion that exposes the fat layer. There is fairly heavy slough accumulation within the wound, but no significant odor. On his right medial calf, he has multiple pinholes that have slough buildup in them and probe for about 2 to 4 mm in depth. They are draining clear fluid. On his right lateral midfoot, he has ulceration consistent with venous stasis. It is geographic and has slough accumulation. 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 02/17/2022: All of the wounds are little bit larger today and have more slough accumulation. Today, the medial calf openings are larger and probe to something that feels calcified. There is odor coming from his wounds. His vascular studies are scheduled for August 9 and 10. 02-24-2022 upon evaluation today patient presents for follow-up concerning his ongoing issues here with his lower extremities. With that being said he does have significant problems with what appears to be cellulitis unfortunately the PCR culture that was sent last week got crushed by [employer] in transit. With that being said we are going to reobtain a culture today although I am actually to do it on the right medial leg as this seems to be the most inflamed area today where there is some questionable drainage as well. I am going to remove some of the calcium  deposits which are loosening obtain a deeper culture from this area. Fortunately I do not see any evidence of active infection systemically which is good news but locally I think we are having a bigger issue here. He does have his appointment next Thursday with vascular. Patient has also been referred to [facility] for further evaluation and treatment of the calcinosis for possible sulfate injections. 03/04/2022: The culture that was performed last week grew out multiple species including Klebsiella, Morganella, and Pseudomonas aeruginosa. He had been prescribed Bactrim but this was not adequate to cover all of the species and levofloxacin was recommended. He completed the Bactrim but did not pick up the levofloxacin and begin taking it until yesterday. 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 He had arterial studies performed today. He does  have evidence of peripheral arterial disease. Results copied here: +-------+-----------+-----------+------------+------------+ ABI/TBIT oday's ABIT oday's TBIPrevious ABIPrevious TBI +-------+-----------+-----------+------------+------------+ Right [place] 0.75    +-------+-----------+-----------+------------+------------+ Left [place] 0.57    +-------+-----------+-----------+------------+------------+ Arterial wall calcification precludes accurate ankle pressures and ABIs. Summary: Right: Resting right ankle-brachial index indicates noncompressible right lower extremity arteries. The right toe-brachial index is normal. Left: Resting left ankle-brachial index indicates noncompressible left RAJOHN, HENERY ([id number]) [id number] Page 4 of 15 lower extremity arteries. The left toe-brachial index is abnormal. He continues to have further tissue breakdown at the right medial calf and there is ample slough accumulation along with necrotic subcutaneous tissue. The left dorsal foot wound has built up slough, as well. The right medial foot wound is nearly closed with just a light layer of eschar over the surface. The right dorsal lateral foot wound has also accumulated a fair amount of slough and remains quite tender. 03/10/2022: He has been taking the prescribed levofloxacin and has about 3 days left of this. The erythema and induration on his right leg has improved. He continues to experience further breakdown of the right medial calf wound. There is extensive slough and debris accumulation there. There is heavy slough on his left dorsal foot wound, but no odor or purulent drainage. The right medial foot wound appears to be closed. The right dorsal lateral foot wound also has a fair amount of slough but it is less tender than at our last visit. He still does not have an appointment with dermatology. 03/22/2022: The right anterior tibial wound has closed. The right  lateral foot wound is nearly closed with just a little bit of slough. The left dorsal foot wound is smaller and continues to have some slough buildup but less so than at prior visits. There is good granulation tissue underlying the slough. Unfortunately the right medial calf wound continues to deteriorate. It has a foul odor today and a lot of necrotic tissue, the surrounding skin is also erythematous and moist. 03/30/2022: The right lateral foot wound continues to contract and just has a little bit of slough and eschar present. The left dorsal foot wound is also smaller with slough overlying good granulation tissue. The right medial calf wound actually looks quite a bit better today; there is no odor and there is much less necrotic tissue although some remains present. We have been using topical gentamicin and he has been taking oral Augmentin. 04/07/2022: The patient saw dermatology at [facility] last week. Unfortunately, it appears that they did not read any of the documentation that  was provided. 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 Overall, it was a highly unsatisfactory consultation. In the meantime, however, we were able to identify compounding pharmacy who could create a topical sodium thiosulfate ointment. The patient has that with him today. Both of the right foot wounds are now closed. The left dorsal foot wound has contracted but still has a layer of slough on the surface. The medial calf wounds on the right all look a little bit better. They still have heavy slough along with the chunks of calcium. Everything is stained purple. 04/14/2022: The wound on his dorsal left foot is a little bit smaller again today. There is  still slough accumulation on the surface. The medial calf wounds have quite a bit less slough accumulation today. His right leg, however, is warm and erythematous, concerning for cellulitis. 04/14/2022: He completed his course of Augmentin yesterday. The medial calf wound sites are much cleaner today and shallower. There is less fragmented calcium in the wounds. He has a lot of lymphedema fluid-like drainage from these wounds, but no purulent discharge or malodor. The wound on his dorsal left foot has also contracted and is shallower. There is a layer of slough over healthy granulation tissue. 05/05/2022: The left dorsal foot wound is smaller today with less slough and more granulation tissue. The right medial calf wounds are shallower, but have extensive slough and some fat necrosis. The drainage has diminished considerably, however. 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 05/12/2022: The left dorsal foot wound continues to contract and fill with granulation tissue. There is slough on the wound surface. The right medial calf wounds also continue to fill with healthy tissue, but there is remaining slough and fat necrosis present. He had a significant increase in his drainage this week and it was a bright yellow-green color. He also had a new wound open over his right anterior tibial surface associated with the spike of cutaneous calcium. The leg is more red, as well, concerning for infection. 05/19/2022: His culture returned positive for Pseudomonas. He is currently taking a course of oral levofloxacin. We have also been using topical gentamicin mixed in with his sodium thiosulfate. All of the wounds look better this week. 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 There is slough and nonviable subcutaneous tissue still present. The new anterior tibial  wound just has a light layer of slough. The dorsal foot wound also has some slough present and appears a little dry today. 05/26/2022: The right medial leg wound continues to improve. It is feeling with granulation tissue, but still accumulates a fairly thick layer of slough on the surface. The more recent anterior tibial wound has remained quite small, but also has some slough on the surface. The left dorsal foot wound has contracted somewhat and has perimeter epithelialization. He completed his oral levofloxacin. 06/02/2022: The left dorsal foot wound continues to improve significantly. There is just a little slough on the wound surface. The right medial leg wound had black drainage, but it looks like silver alginate was applied last week and I theorized that silver sulfide was created with a combination of the  silver alginate and the sodium thiosulfate. It did, however, have a bit of an odor to it and extensive slough accumulation. The small anterior tibial wound also had a bit of slough and nonviable subcutaneous tissue present. The skin of his leg was rather macerated, as well. 06/09/2022: The left dorsal foot wound is smaller again this week with just a light layer of slough on the surface. The right medial leg wound looks substantially better. The leg and periwound skin are no longer red and macerated. There is good granulation tissue forming with less slough and nonviable tissue. The anterior tibial wound just has a small amount of slough accumulation. 06/16/2022: The left dorsal foot wound continues to contract. His wrap slipped a little bit, however, and his foot is more swollen. The right medial leg wound continues to improve. The underlying tissue is more vital-appearing and there is less slough. He does have substantial drainage. The small anterior tibial wound has a bit of slough accumulation. 06/23/2022: The left dorsal foot wound is about half the size as it was last week. There is just  some light slough on the surface and some eschar buildup around the edges. The right anterior leg wound is small with a little slough on the surface. Unfortunately, the right medial leg wound deteriorated fairly substantially. It is malodorous with gray/green/black discoloration. 06/30/2022: The left dorsal foot wound continues to contract. It is nearly closed with just a light layer of slough present. The right anterior leg wound is about the same size and has a small amount of slough on the surface. The right medial leg wound looks a lot better than it did last week. The odor has abated and the tissue is less pulpy and necrotic-looking. There is still a fairly thick layer of slough present. 12/13; the left dorsal foot wound looks very healthy. There was no need to change the dressing here and no debridement. He has a small area on the right anterior lower leg apparently had not changed much in size. The area on the medial leg is the most problematic area this is large with some depth and a completely nonviable surface today. 07/14/2022: The left dorsal foot wound is smaller today with just a little slough and eschar present. The right anterior lower leg wound is about the same without any significant change. The medial leg wound has a black and green surface and is malodorous. No purulent drainage and the periwound skin is intact. Edema control is suboptimal. 07/21/2022: The dorsal foot wound continues to contract and is nearly closed with just a little bit of slough and eschar on the surface. The right anterior lower leg wound is shallower today but otherwise the dimensions are unchanged. The medial leg wound looks significantly better although it still has a nonviable surface; the odor and black/green surface has resolved. His culture came back with multiple species sensitive to Augmentin, which was prescribed and a very low level of Pseudomonas, which should be handled by the topical gentamicin we  have been using. 07/28/2022: The dorsal foot wound is down to just a very tiny opening with a little eschar on the surface. The right anterior tibial wound is about the same with some slough buildup. The right medial leg wound looks quite a bit better this week. There is thick rubbery slough on the surface, but the odor and drainage has improved significantly. 08/04/2022: The dorsal foot wound is almost healed, but the periwound skin has broken down. This appears to be secondary  to moisture and adhesive from the absorbent pad. The right anterior tibial wound is shallower. The right medial leg wound continues to improve. It is smaller, still with rubbery slough on the DELTON, STELLE ([id number]) [id number] Page 5 of 15 surface. No malodor or purulent drainage. 08/11/2022: The dorsal foot wound is about the same size. It is a little bit dry with some slough accumulation. The right anterior tibial wound is also unchanged. The right medial leg wound is filling in further with good granulation tissue. Still with some slough buildup on the surface. 08/18/2022: His left leg is bright red, hot, and swollen with cellulitis. The dorsal foot wound actually looks quite good and is superficial with just a little bit of slough accumulation. The right anterior tibial wound is unchanged. The right medial leg wound has filled in with good granulation tissue but has copious amounts of drainage. 08/25/2022: His cellulitis has responded nicely to the oral antibiotics. His dorsal foot wound is smaller but has a fair amount of slough buildup. The right anterior tibial wound remains stable and unchanged. The right medial leg wound has more granulation tissue under a layer of slough, but continues to have copious drainage. The drainage is actually causing skin irritation and threatens to result in breakdown. 09/01/2022: He continues to have profuse drainage from his wounds which is resulting in tissue  maceration on both the dorsum of his left foot and the periwound distal to his medial leg wound. The dorsal foot wound is nearly healed, despite this. The medial right leg wound is filling in with good granulation tissue. We are working on getting home health assistance to change his dressings more frequently to try and control the drainage better. 09/08/2022: We have had success in controlling his drainage. The tissue maceration has improved significantly and his swelling has come down quite markedly. The medial right leg wound is much more superficial and has substantially less nonviable tissue present. The anterior tibial wound is a little bit larger, however with some nonviable tissue present. 09/15/2022: The dorsal foot wound is nearly closed. The anterior tibial wound measures slightly larger today. The medial right leg wound continues to improve quite dramatically with less nonviable tissue and more robust-looking granulation. Edema control is significantly better. 09/22/2022: The left dorsal foot wound remains open, albeit quite tiny. The anterior tibial wound is a little bit larger again today. The medial right leg wound continues to improve with an increase in robust and healthy granulation tissue and less accumulation of nonviable tissue. 09/29/2022: His home health providers wrapped his left leg extremely tightly and he ended up having to cut the wrap off of his foot. This resulted in his foot being very swollen and that the dorsal foot wound is a little bit larger today. The anterior tibial wound is about the same size with a little slough on the surface. The medial right leg wound continues to fill in. There are very few areas with any significant depth. There is still slough accumulation. 3/13; Patient has a left dorsal foot wound along with a right anterior tibial wound and right medial wound. We are using silver alginate to the left dorsal foot wound, Santyl and sodium thiosulfate compound  ointment to the right lower extremity wounds under 4 layer compression. Patient has no issues or complaints today. Electronic Signature(s) Signed: 10/07/2022 3:34:03 PM By: [name] DO Entered By: [name] on 10/06/2022 13:42:02 -------------------------------------------------------------------------------- Physical Exam Details Patient Name: Date of Service: Annice Needy, [name] 10/06/2022 12:30 PM  Medical Record Number: [id number] Patient Account Number: [id number] Date of Birth/Sex: Treating RN: [birth year] (72 y.o. M) Primary Care Provider: PA [name], NO Other Clinician: Referring Provider: Treating Provider/Extender: Jana Hakim, Edwena Felty in Treatment: 34 Constitutional respirations regular, non-labored and within target range for patient.. Cardiovascular 2+ dorsalis pedis/posterior tibialis pulses. Psychiatric pleasant and cooperative. Notes Left dorsal foot wound with small open wound that has granulation tissue and slough. T the right medial leg wound there is an open wound with granulation o tissue and slough accumulation with varying depths throughout the wound bed. The anterior tibial wound is small and has increased depth however does not probe to bone. No signs of surrounding infection to any of the wound beds. 2+ pitting edema to the knees. Electronic Signature(s) Signed: 10/07/2022 3:34:03 PM By: [name] DO Entered By: [name] on 10/06/2022 13:43:35 -------------------------------------------------------------------------------- Physician Orders Details Patient Name: Date of Service: VARD, WAYNER [name] 10/06/2022 12:30 PM Medical Record Number: [id number] Patient Account Number: [id number] Date of Birth/Sex: Treating RN: 1950-10-01 (72 y.o. 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 - Dr. [name] Rm 1 Return A Wednesday 3/20 @ 1:15 pm Anesthetic Wound #1 Left,Dorsal Foot (In clinic) Topical Lidocaine 4% applied to wound bed Wound #3 Right,Medial Lower Leg (In clinic) Topical Lidocaine 4% applied to wound bed Wound #6 Right,Anterior Lower Leg (In clinic) Topical Lidocaine 4% applied to wound bed Bathing/ Shower/ Hygiene May shower with protection but do not get wound dressing(s) wet. Protect dressing(s) with water repellant cover (for example, large plastic bag) or a cast cover and may then take shower. Edema Control - Lymphedema / SCD / Other Lymphedema Pumps. Use Lymphedema pumps on leg(s) 2-3 times a day for 45-60 minutes. If wearing any wraps or hose, do not remove them. Continue exercising as instructed. - use 1-2 times per day over wraps Elevate legs to the level of the heart or above for 30 minutes daily and/or when sitting for 3-4 times a day throughout the day. - throughout the day Avoid standing for long periods of time. Exercise regularly Home Health New wound care orders this week; continue Home Health for wound care. May utilize formulary equivalent dressing for wound treatment orders unless otherwise specified. - Change right leg dressing only!! Dressing changes to be completed by Home Health  on Monday / Wednesday / Friday except when patient has scheduled visit at [facility]. - pt seen in clinic on Wednesday Other Home Health Orders/Instructions: - [facility] Wound Treatment Wound #1 - Foot Wound Laterality: Dorsal, Left Peri-Wound Care: Zinc Oxide Ointment 30g tube 1 x Per Week/30 Days Discharge Instructions: Apply Zinc Oxide to periwound with each dressing change as needed for excoriation Peri-Wound Care: Sween Lotion (Moisturizing lotion) 1 x Per Week/30 Days Discharge Instructions: Apply moisturizing lotion as directed Prim Dressing: Sorbalgon [employer] Dressing, 4x4 (in/in) (Generic) 1 x Per Week/30  Days ary Discharge Instructions: or equivalent silver alginate. Apply to wound bed as instructed Secondary Dressing: ABD Pad, 5x9 (Generic) 1 x Per Week/30 Days Discharge Instructions: Apply over primary dressing as directed. Secondary Dressing: Woven Gauze Sponge, Non-Sterile 4x4 in (Generic) 1 x Per Week/30 Days Discharge Instructions: Apply over primary dressing as directed. Compression Wrap: FourPress (4 layer compression wrap) (Generic) 1 x Per Week/30 Days Discharge Instructions: Apply four layer compression as directed. May also use Urgo K2 compression system as alternative. Wound #3 - Lower Leg Wound Laterality: Right, Medial Peri-Wound Care: Triamcinolone 15 (g) 3 x Per Week/30 Days Discharge Instructions: Use triamcinolone 15 (g) as directed Peri-Wound Care: Zinc Oxide Ointment 30g tube 3 x Per Week/30 Days Discharge Instructions: Apply Zinc Oxide to excoriated periwound with each dressing change as needed Peri-Wound Care: Sween Lotion (Moisturizing lotion) 3 x Per Week/30 Days ARDITH, LEWMAN ([id number]) [id number] Page 7 of 15 Discharge Instructions: Apply moisturizing lotion as directed Prim Dressing: Santyl Ointment 3 x Per Week/30 Days ary Discharge Instructions: to wound bed mixed 1:1 with sodium thiosulfate ointment Prim Dressing:  sodium thiosulfate ointment 3 x Per Week/30 Days ary Discharge Instructions: 1:1 mix with santyl to wound bed Secondary Dressing: ABD Pad, 8x10 (Generic) 3 x Per Week/30 Days Discharge Instructions: Apply over primary dressing as directed. Secondary Dressing: Woven Gauze Sponge, Non-Sterile 4x4 in (Generic) 3 x Per Week/30 Days Discharge Instructions: Apply over primary dressing as directed. Compression Wrap: FourPress (4 layer compression wrap) (Generic) 3 x Per Week/30 Days Discharge Instructions: Apply four layer compression as directed. May also use Urgo K2 compression system as alternative. Wound #6 - Lower Leg Wound Laterality: Right, Anterior Peri-Wound Care: Triamcinolone 15 (g) 3 x Per Week/30 Days Discharge Instructions: Use triamcinolone 15 (g) ( in clinic) Peri-Wound Care: Zinc Oxide Ointment 30g tube 3 x Per Week/30 Days Discharge Instructions: Apply Zinc Oxide to periwound with each dressing change as needed for excoriation Peri-Wound Care: Sween Lotion (Moisturizing lotion) 3 x Per Week/30 Days Discharge Instructions: Apply moisturizing lotion as directed Prim Dressing: sodium thiosulfate ointment 3 x Per Week/30 Days ary Discharge Instructions: to wound bed with each dressing change Secondary Dressing: ABD Pad, 5x9 (Generic) 3 x Per Week/30 Days Discharge Instructions: Apply over primary dressing as directed. Secondary Dressing: Woven Gauze Sponge, Non-Sterile 4x4 in (Generic) 3 x Per Week/30 Days Discharge Instructions: Apply over primary dressing as directed. Compression Wrap: FourPress (4 layer compression wrap) (Generic) 3 x Per Week/30 Days Discharge Instructions: Apply four layer compression as directed. May also use Urgo K2 compression system as alternative. Electronic Signature(s) Signed: 10/07/2022 3:34:03 PM By: [name] DO Entered By: [name] on 10/06/2022  13:43:44 -------------------------------------------------------------------------------- Problem List Details Patient Name: Date of Service: [name], [name] 10/06/2022 12:30 PM Medical Record Number: [id number] Patient Account Number: [id number] Date of Birth/Sex: Treating RN: Mar 31, 1951 (72 y.o. 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) 02/09/2022 No Yes I10 Essential (primary) hypertension 02/09/2022 No Yes I25.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 02/09/2022 No  Yes I89.0 Lymphedema, not elsewhere classified 02/09/2022 No Yes Z86.718 Personal history of other venous thrombosis and embolism 02/09/2022 No Yes Inactive Problems ICD-10 Code Description Active Date Inactive Date L97.512 Non-pressure chronic ulcer of other part of right foot with fat layer exposed 02/17/2022 02/17/2022 Resolved Problems Electronic Signature(s) Signed: 10/07/2022 3:34:03 PM By: [name] DO Previous Signature: 10/06/2022 11:58:37 AM Version By: [name] DO Previous Signature: 10/06/2022 11:58:58 AM Version By: Zenaida Deed RN, BSN Entered By: [name] on 10/06/2022 13:39:48 -------------------------------------------------------------------------------- Progress Note Details Patient Name: Date of Service: [name] 10/06/2022 12:30 PM Medical Record Number:  [id number] Patient Account Number: [id number] Date of Birth/Sex: Treating RN: 1951/04/23 (72 y.o. M) Primary Care Provider: PA [name], NO Other Clinician: Referring Provider: Treating Provider/Extender: Sandra Cockayne in Treatment: 34 Subjective Chief Complaint Information obtained from Patient Patient seen for complaints of Non-Healing Wounds. History of Present Illness (HPI) ADMISSION 02/09/2022 This is a 72 year old man who recently moved to [place] from [place]. He has a history of of coronary artery disease and prior left popliteal vein DVT . He is not diabetic and does not smoke. 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 He has had multiple spinal surgeries and has limited mobility. He primarily uses a motorized wheelchair. He has had lymphedema for many years and does have lymphedema pumps although he says he does not use them frequently. He has wounds on his bilateral lower extremities. He was receiving care in [place] for these prior to his move. They were apparently packing his wounds with Betadine soaked gauze. He has worn compression wraps in the past but not recently. He did say that they helped tremendously. In clinic today, his right ABI is noncompressible but has good signals; the left ABI was 1.17. No formal vascular studies have been performed. On his left dorsal foot, there is a circular lesion that exposes the fat layer. There is fairly heavy slough accumulation within the wound, but no significant odor. On his right medial calf, he has multiple pinholes that have slough buildup in them and probe for about 2 to 4 mm in depth. They are draining clear fluid. On his right lateral midfoot, he has ulceration consistent with venous stasis. It is geographic and has slough accumulation. 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 02/17/2022: All of the  wounds are little bit larger today and have more slough accumulation. Today, the medial calf openings are larger and probe to something that feels calcified. There is odor coming from his wounds. His vascular studies are scheduled for August 9 and 10. 02-24-2022 upon evaluation today patient presents for follow-up concerning his ongoing issues here with his lower extremities. With that being said he does have significant problems with what appears to be cellulitis unfortunately the PCR culture that was sent last week got crushed by [employer] in transit. With that being said we are going to reobtain a culture today although I am actually to do it on the right medial leg as this seems to be the most inflamed area today where there is some questionable drainage as well. I am going to remove some of the calcium deposits which are loosening obtain a deeper culture from this area. Fortunately I do not see any evidence of active infection systemically which is good news but locally I think we are having a bigger issue here. He does have his  appointment next Thursday with vascular. Patient has also been referred to [facility] for further evaluation and treatment of the calcinosis for possible JARRELL, ARMOND ([id number]) [id number] Page 9 of 15 sulfate injections. 03/04/2022: The culture that was performed last week grew out multiple species including Klebsiella, Morganella, and Pseudomonas aeruginosa. He had been prescribed Bactrim but this was not adequate to cover all of the species and levofloxacin was recommended. He completed the Bactrim but did not pick up the levofloxacin and begin taking it until yesterday. 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 He had arterial studies performed today. He does have evidence of  peripheral arterial disease. Results copied here: +-------+-----------+-----------+------------+------------+ ABI/TBIT oday's ABIT oday's TBIPrevious ABIPrevious TBI +-------+-----------+-----------+------------+------------+ Right [place] 0.75    +-------+-----------+-----------+------------+------------+ Left [place] 0.57    +-------+-----------+-----------+------------+------------+ Arterial wall calcification precludes accurate ankle pressures and ABIs. Summary: Right: Resting right ankle-brachial index indicates noncompressible right lower extremity arteries. The right toe-brachial index is normal. Left: Resting left ankle-brachial index indicates noncompressible left lower extremity arteries. The left toe-brachial index is abnormal. He continues to have further tissue breakdown at the right medial calf and there is ample slough accumulation along with necrotic subcutaneous tissue. The left dorsal foot wound has built up slough, as well. The right medial foot wound is nearly closed with just a light layer of eschar over the surface. The right dorsal lateral foot wound has also accumulated a fair amount of slough and remains quite tender. 03/10/2022: He has been taking the prescribed levofloxacin and has about 3 days left of this. The erythema and induration on his right leg has improved. He continues to experience further breakdown of the right medial calf wound. There is extensive slough and debris accumulation there. There is heavy slough on his left dorsal foot wound, but no odor or purulent drainage. The right medial foot wound appears to be closed. The right dorsal lateral foot wound also has a fair amount of slough but it is less tender than at our last visit. He still does not have an appointment with dermatology. 03/22/2022: The right anterior tibial wound has closed. The right lateral foot wound is nearly closed with just a little bit of slough. The left dorsal foot wound  is smaller and continues to have some slough buildup but less so than at prior visits. There is good granulation tissue underlying the slough. Unfortunately the right medial calf wound continues to deteriorate. It has a foul odor today and a lot of necrotic tissue, the surrounding skin is also erythematous and moist. 03/30/2022: The right lateral foot wound continues to contract and just has a little bit of slough and eschar present. The left dorsal foot wound is also smaller with slough overlying good granulation tissue. The right medial calf wound actually looks quite a bit better today; there is no odor and there is much less necrotic tissue although some remains present. We have been using topical gentamicin and he has been taking oral Augmentin. 04/07/2022: The patient saw dermatology at [facility] last week. Unfortunately, it appears that they did not read any of the documentation that was provided. 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 Overall, it was a highly unsatisfactory consultation. In the meantime, however, we were able to identify compounding pharmacy who could create a topical sodium thiosulfate ointment. The patient has that with him today. Both of the right foot wounds are now closed. The left dorsal foot wound has contracted but still has a layer of slough on the surface. The medial calf wounds on the right all look a little bit better. They still have heavy slough along with the chunks of calcium. Everything is stained purple. 04/14/2022: The wound on his dorsal left foot is a little bit smaller again today. There is still slough accumulation on the surface. The medial calf wounds have quite a bit less slough  accumulation today. His right leg, however, is warm and erythematous, concerning for cellulitis. 04/14/2022: He completed his course of Augmentin yesterday. The medial calf wound sites are much cleaner today and shallower. There is less fragmented calcium in the wounds. He has a lot of lymphedema fluid-like drainage from these wounds, but no purulent discharge or malodor. The wound on his dorsal left foot has also contracted and is shallower. There is a layer of slough over healthy granulation tissue. 05/05/2022: The left dorsal foot wound is smaller today with less slough and more granulation tissue. The right medial calf wounds are shallower, but have extensive slough and some fat necrosis. The drainage has diminished considerably, however. 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 05/12/2022: The left dorsal foot wound continues to contract and fill with granulation tissue. There is slough on the wound surface. The right medial calf wounds also continue to fill with healthy tissue, but there is remaining slough and fat necrosis present. He had a significant increase in his drainage this week and it was a bright yellow-green color. He also had a new wound open over his right anterior tibial surface associated with the spike of cutaneous calcium. The leg is more red, as well, concerning for infection. 05/19/2022: His culture returned positive for Pseudomonas. He is currently taking a course of oral levofloxacin. We have also been using topical gentamicin mixed in with his sodium thiosulfate. All of the wounds look better this week. 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 There is slough and nonviable subcutaneous tissue still present. The new anterior tibial wound just has a light layer of slough. The dorsal foot wound also has some slough present  and appears a little dry today. 05/26/2022: The right medial leg wound continues to improve. It is feeling with granulation tissue, but still accumulates a fairly thick layer of slough on the surface. The more recent anterior tibial wound has remained quite small, but also has some slough on the surface. The left dorsal foot wound has contracted somewhat and has perimeter epithelialization. He completed his oral levofloxacin. 06/02/2022: The left dorsal foot wound continues to improve significantly. There is just a little slough on the wound surface. The right medial leg wound had black drainage, but it looks like silver alginate was applied last week and I theorized that silver sulfide was created with a combination of the silver alginate and the sodium thiosulfate. It did, however, have a bit of an odor to it and extensive slough accumulation. The small anterior tibial wound also had a bit of slough and nonviable subcutaneous tissue present. The skin of his  leg was rather macerated, as well. 06/09/2022: The left dorsal foot wound is smaller again this week with just a light layer of slough on the surface. The right medial leg wound looks substantially better. The leg and periwound skin are no longer red and macerated. There is good granulation tissue forming with less slough and nonviable tissue. The anterior tibial wound just has a small amount of slough accumulation. 06/16/2022: The left dorsal foot wound continues to contract. His wrap slipped a little bit, however, and his foot is more swollen. The right medial leg wound continues to improve. The underlying tissue is more vital-appearing and there is less slough. He does have substantial drainage. The small anterior tibial wound has a bit of slough accumulation. JACOBEY, GURA ([id number]) [id number] Page 10 of 15 06/23/2022: The left dorsal foot wound is about half the size as it was last week. There is just some light  slough on the surface and some eschar buildup around the edges. The right anterior leg wound is small with a little slough on the surface. Unfortunately, the right medial leg wound deteriorated fairly substantially. It is malodorous with gray/green/black discoloration. 06/30/2022: The left dorsal foot wound continues to contract. It is nearly closed with just a light layer of slough present. The right anterior leg wound is about the same size and has a small amount of slough on the surface. The right medial leg wound looks a lot better than it did last week. The odor has abated and the tissue is less pulpy and necrotic-looking. There is still a fairly thick layer of slough present. 12/13; the left dorsal foot wound looks very healthy. There was no need to change the dressing here and no debridement. He has a small area on the right anterior lower leg apparently had not changed much in size. The area on the medial leg is the most problematic area this is large with some depth and a completely nonviable surface today. 07/14/2022: The left dorsal foot wound is smaller today with just a little slough and eschar present. The right anterior lower leg wound is about the same without any significant change. The medial leg wound has a black and green surface and is malodorous. No purulent drainage and the periwound skin is intact. Edema control is suboptimal. 07/21/2022: The dorsal foot wound continues to contract and is nearly closed with just a little bit of slough and eschar on the surface. The right anterior lower leg wound is shallower today but otherwise the dimensions are unchanged. The medial leg wound looks significantly better although it still has a nonviable surface; the odor and black/green surface has resolved. His culture came back with multiple species sensitive to Augmentin, which was prescribed and a very low level of Pseudomonas, which should be handled by the topical gentamicin we have been  using. 07/28/2022: The dorsal foot wound is down to just a very tiny opening with a little eschar on the surface. The right anterior tibial wound is about the same with some slough buildup. The right medial leg wound looks quite a bit better this week. There is thick rubbery slough on the surface, but the odor and drainage has improved significantly. 08/04/2022: The dorsal foot wound is almost healed, but the periwound skin has broken down. This appears to be secondary to moisture and adhesive from the absorbent pad. The right anterior tibial wound is shallower. The right medial leg wound continues to improve. It is smaller, still with rubbery slough on the surface.  No malodor or purulent drainage. 08/11/2022: The dorsal foot wound is about the same size. It is a little bit dry with some slough accumulation. The right anterior tibial wound is also unchanged. The right medial leg wound is filling in further with good granulation tissue. Still with some slough buildup on the surface. 08/18/2022: His left leg is bright red, hot, and swollen with cellulitis. The dorsal foot wound actually looks quite good and is superficial with just a little bit of slough accumulation. The right anterior tibial wound is unchanged. The right medial leg wound has filled in with good granulation tissue but has copious amounts of drainage. 08/25/2022: His cellulitis has responded nicely to the oral antibiotics. His dorsal foot wound is smaller but has a fair amount of slough buildup. The right anterior tibial wound remains stable and unchanged. The right medial leg wound has more granulation tissue under a layer of slough, but continues to have copious drainage. The drainage is actually causing skin irritation and threatens to result in breakdown. 09/01/2022: He continues to have profuse drainage from his wounds which is resulting in tissue maceration on both the dorsum of his left foot and the periwound distal to his medial leg  wound. The dorsal foot wound is nearly healed, despite this. The medial right leg wound is filling in with good granulation tissue. We are working on getting home health assistance to change his dressings more frequently to try and control the drainage better. 09/08/2022: We have had success in controlling his drainage. The tissue maceration has improved significantly and his swelling has come down quite markedly. The medial right leg wound is much more superficial and has substantially less nonviable tissue present. The anterior tibial wound is a little bit larger, however with some nonviable tissue present. 09/15/2022: The dorsal foot wound is nearly closed. The anterior tibial wound measures slightly larger today. The medial right leg wound continues to improve quite dramatically with less nonviable tissue and more robust-looking granulation. Edema control is significantly better. 09/22/2022: The left dorsal foot wound remains open, albeit quite tiny. The anterior tibial wound is a little bit larger again today. The medial right leg wound continues to improve with an increase in robust and healthy granulation tissue and less accumulation of nonviable tissue. 09/29/2022: His home health providers wrapped his left leg extremely tightly and he ended up having to cut the wrap off of his foot. This resulted in his foot being very swollen and that the dorsal foot wound is a little bit larger today. The anterior tibial wound is about the same size with a little slough on the surface. The medial right leg wound continues to fill in. There are very few areas with any significant depth. There is still slough accumulation. 3/13; Patient has a left dorsal foot wound along with a right anterior tibial wound and right medial wound. We are using silver alginate to the left dorsal foot wound, Santyl and sodium thiosulfate compound ointment to the right lower extremity wounds under 4 layer compression. Patient has no  issues or complaints today. Patient History Information obtained from Patient, Chart. Family History Cancer - Mother, Heart Disease - Father,Mother, Hypertension - Father,Siblings, Lung Disease - Siblings, No family history of Diabetes, Hereditary Spherocytosis, Kidney Disease, Seizures, Stroke, Thyroid Problems, Tuberculosis. Social History Former smoker - quit 1991, Marital Status - Married, Alcohol Use - Moderate, Drug Use - No History, Caffeine Use - Daily - coffee. Medical History Eyes Patient has history of Cataracts - right  eye removed Denies history of Glaucoma, Optic Neuritis Ear/Nose/Mouth/Throat Denies history of Chronic sinus problems/congestion, Middle ear problems Hematologic/Lymphatic Patient has history of Anemia - macrocytic, Lymphedema Cardiovascular Patient has history of Congestive Heart Failure, Deep Vein Thrombosis - left leg, Hypertension, Peripheral Venous Disease Endocrine Denies history of Type I Diabetes, Type II Diabetes Genitourinary Denies history of End Stage Renal Disease Integumentary (Skin) Denies history of History of Burn Oncologic Denies history of Received Chemotherapy, Received Radiation Psychiatric [name], [name] ([id number]) [id number] Page 11 of 15 Denies history of Anorexia/bulimia, Confinement Anxiety Hospitalization/Surgery History - cervical fusion. - lumbar surgery. - coronary stent placement. - lasik eye surgery. - hydrocele excision. Medical A Surgical History Notes nd Constitutional Symptoms (General Health) obesity Ear/Nose/Mouth/Throat hard of hearing Respiratory frozen diaphragm right Cardiovascular hyperlipidemia Musculoskeletal myositis, lumbar DDD, spinal stenosis, cervical facet joint syndrome Objective Constitutional respirations regular, non-labored and within target range for patient.. Vitals Time Taken: 12:30 PM, Height: 71 in, Weight: 267 lbs, BMI: 37.2, Temperature: 98 F, Pulse: 96  bpm, Respiratory Rate: 18 breaths/min, Blood Pressure: 153/80 mmHg. Cardiovascular 2+ dorsalis pedis/posterior tibialis pulses. Psychiatric pleasant and cooperative. General Notes: Left dorsal foot wound with small open wound that has granulation tissue and slough. T the right medial leg wound there is an open wound with o granulation tissue and slough accumulation with varying depths throughout the wound bed. The anterior tibial wound is small and has increased depth however does not probe to bone. No signs of surrounding infection to any of the wound beds. 2+ pitting edema to the knees. Integumentary (Hair, Skin) Wound #1 status is Open. Original cause of wound was Gradually Appeared. The date acquired was: 12/07/2021. The wound has been in treatment 34 weeks. The wound is located on the Left,Dorsal Foot. The wound measures 0.5cm length x 0.6cm width x 0.1cm depth; 0.236cm^2 area and 0.024cm^3 volume. There is Fat Layer (Subcutaneous Tissue) exposed. There is no tunneling or undermining noted. There is a small amount of serous drainage noted. The wound margin is distinct with the outline attached to the wound base. There is large (67-100%) red granulation within the wound bed. There is a small (1-33%) amount of necrotic tissue within the wound bed including Adherent Slough. The periwound skin appearance exhibited: Hemosiderin Staining. The periwound skin appearance did not exhibit: Excoriation, Dry/Scaly, Maceration, Rubor, Erythema. Periwound temperature was noted as No Abnormality. The periwound has tenderness on palpation. Wound #3 status is Open. Original cause of wound was Gradually Appeared. The date acquired was: 12/07/2021. The wound has been in treatment 34 weeks. The wound is located on the Right,Medial Lower Leg. The wound measures 5.2cm length x 5.6cm width x 0.4cm depth; 22.871cm^2 area and 9.148cm^3 volume. There is Fat Layer (Subcutaneous Tissue) exposed. There is no tunneling or  undermining noted. There is a medium amount of serosanguineous drainage noted. The wound margin is distinct with the outline attached to the wound base. There is medium (34-66%) red granulation within the wound bed. There is a medium (34-66%) amount of necrotic tissue within the wound bed including Adherent Slough. The periwound skin appearance had no abnormalities noted for texture. The periwound skin appearance exhibited: Hemosiderin Staining. The periwound skin appearance did not exhibit: Dry/Scaly, Maceration, Rubor, Erythema. Periwound temperature was noted as No Abnormality. The periwound has tenderness on palpation. Wound #6 status is Open. Original cause of wound was Gradually Appeared. The date acquired was: 05/12/2022. The wound has been in treatment 21 weeks. The wound is located on the Right,Anterior Lower  Leg. The wound measures 0.6cm length x 0.6cm width x 0.3cm depth; 0.283cm^2 area and 0.085cm^3 volume. There is Fat Layer (Subcutaneous Tissue) exposed. There is a medium amount of serosanguineous drainage noted. The wound margin is epibole. There is small (1-33%) red granulation within the wound bed. There is a small (1-33%) amount of necrotic tissue within the wound bed including Adherent Slough. The periwound skin appearance had no abnormalities noted for texture. The periwound skin appearance had no abnormalities noted for moisture. The periwound skin appearance exhibited: Hemosiderin Staining. The periwound skin appearance did not exhibit: Atrophie Blanche, Cyanosis, Ecchymosis, Mottled, Pallor, Rubor, Erythema. Periwound temperature was noted as No Abnormality. The periwound has tenderness on palpation. 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 The left dorsal foot wound has improved in size) since last clinic visit. I debrided nonviable tissue. I recommended continuing the course with silver alginate under 4-layer compression to the left lower extremity and Santyl with sodium thiosulfate under 4-layer compression to the right lower extremity. Follow-up in 1 week. Procedures Wound #1 Pre-procedure diagnosis of Wound #1 is a Lymphedema located on the Left,Dorsal Foot . There was a Excisional Skin/Subcutaneous Tissue Debridement with a total area of 0.3 sq cm performed by [name], DO. With the following instrument(s): Curette to remove Viable and Non-Viable tissue/material. Material removed includes Subcutaneous Tissue and Slough and after achieving pain control using Lidocaine 4% T opical Solution. No specimens were taken. A time out was conducted at 13:05, prior to the start of the procedure. A Minimum amount of bleeding was controlled with Pressure. The procedure was tolerated well with a pain level of 0 throughout and a pain level of 0 following the procedure. Post Debridement Measurements: 0.5cm length x 0.6cm width x 0.1cm depth; 0.024cm^3 volume. Character of Wound/Ulcer Post Debridement is stable. Post procedure Diagnosis Wound #1: Same as Pre-Procedure Pre-procedure diagnosis of Wound #1 is a Lymphedema located on the Left,Dorsal Foot . There was a Four Layer Compression Therapy Procedure by Zenaida Deed, RN. Post procedure Diagnosis Wound #1: Same as Pre-Procedure Wound #3 Pre-procedure diagnosis of Wound #3 is a Lymphedema located on the Right,Medial Lower Leg . There was a Excisional Skin/Subcutaneous  Tissue Debridement with a total area of 14 sq cm performed by [name], DO. With the following instrument(s): Curette to remove Viable and Non-Viable tissue/material. Material removed includes Subcutaneous Tissue and Slough and after achieving pain control using Lidocaine 4% T opical Solution. No specimens were taken. A time out was conducted at 13:05, prior to the start of the procedure. A Minimum amount of bleeding was controlled with Pressure. The procedure was tolerated well with a pain level of 0 throughout and a pain level of 0 following the procedure. Post Debridement Measurements: 5.2cm length x 5.6cm width x 0.4cm depth; 9.148cm^3 volume. Character of Wound/Ulcer Post Debridement is stable. Post procedure Diagnosis Wound #3: Same as Pre-Procedure Pre-procedure diagnosis of Wound #3 is a Lymphedema located on the Right,Medial Lower Leg . There was a Four Layer Compression Therapy Procedure by Zenaida Deed, RN. Post procedure Diagnosis Wound #3: Same as  Pre-Procedure Wound #6 Pre-procedure diagnosis of Wound #6 is a Calcinosis located on the Right,Anterior Lower Leg . There was a Excisional Skin/Subcutaneous Tissue Debridement with a total area of 0.36 sq cm performed by [name], DO. With the following instrument(s): Curette to remove Viable and Non-Viable tissue/material. Material removed includes Subcutaneous Tissue and Slough and after achieving pain control using Lidocaine 4% T opical Solution. No specimens were taken. A time out was conducted at 13:05, prior to the start of the procedure. A Minimum amount of bleeding was controlled with Pressure. The procedure was tolerated well with a pain level of 0 throughout and a pain level of 0 following the procedure. Post Debridement Measurements: 0.6cm length x 0.6cm width x 0.3cm depth; 0.085cm^3 volume. Character of Wound/Ulcer Post Debridement is stable. Post procedure Diagnosis Wound #6: Same as  Pre-Procedure Plan Follow-up Appointments: Return Appointment in 1 week. - Dr. [name] Rm 1 Wednesday 3/20 @ 1:15 pm Anesthetic: Wound #1 Left,Dorsal Foot: (In clinic) Topical Lidocaine 4% applied to wound bed Wound #3 Right,Medial Lower Leg: (In clinic) Topical Lidocaine 4% applied to wound bed Wound #6 Right,Anterior Lower Leg: (In clinic) Topical Lidocaine 4% applied to wound bed Bathing/ Shower/ Hygiene: May shower with protection but do not get wound dressing(s) wet. Protect dressing(s) with water repellant cover (for example, large plastic bag) or a cast cover and may then take shower. Edema Control - Lymphedema / SCD / Other: Lymphedema Pumps. Use Lymphedema pumps on leg(s) 2-3 times a day for 45-60 minutes. If wearing any wraps or hose, do not remove them. Continue exercising as instructed. - use 1-2 times per day over wraps Elevate legs to the level of the heart or above for 30 minutes daily and/or when sitting for 3-4 times a day throughout the day. - throughout the day Avoid standing for long periods of time. Exercise regularly Home Health: New wound care orders this week; continue Home Health for wound care. May utilize formulary equivalent dressing for wound treatment orders unless otherwise specified. - Change right leg dressing only!! Dressing changes to be completed by Home Health on Monday / Wednesday / Friday except when patient has scheduled visit at [facility]. - pt seen in clinic on Wednesday Other Home Health Orders/Instructions: - [facility] WOUND #1: - Foot Wound Laterality: Dorsal, Left Peri-Wound Care: Zinc Oxide Ointment 30g tube 1 x Per Week/30 Days Discharge Instructions: Apply Zinc Oxide to periwound with each dressing change as needed for excoriation Peri-Wound Care: Sween Lotion (Moisturizing lotion) 1 x Per Week/30 Days Discharge Instructions: Apply moisturizing lotion as directed Prim Dressing: Sorbalgon [employer] Dressing, 4x4 (in/in) (Generic) 1 x Per  Week/30 Days ary Discharge Instructions: or equivalent silver alginate. Apply to wound bed as instructed [name], [name] ([id number]) [id number] Page 13 of 15 Secondary Dressing: ABD Pad, 5x9 (Generic) 1 x Per Week/30 Days Discharge Instructions: Apply over primary dressing as directed. Secondary Dressing: Woven Gauze Sponge, Non-Sterile 4x4 in (Generic) 1 x Per Week/30 Days Discharge Instructions: Apply over primary dressing as directed. Com pression Wrap: FourPress (4 layer compression wrap) (Generic) 1 x Per Week/30 Days Discharge Instructions: Apply four layer compression as directed. May also use Urgo K2 compression system as alternative. WOUND #3: - Lower Leg Wound Laterality: Right, Medial Peri-Wound Care: Triamcinolone 15 (g) 3 x Per Week/30 Days Discharge Instructions: Use triamcinolone 15 (g) as directed Peri-Wound Care: Zinc Oxide Ointment 30g tube 3 x Per Week/30 Days Discharge Instructions: Apply Zinc Oxide to excoriated periwound with each dressing change  as needed Peri-Wound Care: Sween Lotion (Moisturizing lotion) 3 x Per Week/30 Days Discharge Instructions: Apply moisturizing lotion as directed Prim Dressing: Santyl Ointment 3 x Per Week/30 Days ary Discharge Instructions: to wound bed mixed 1:1 with sodium thiosulfate ointment Prim Dressing: sodium thiosulfate ointment 3 x Per Week/30 Days ary Discharge Instructions: 1:1 mix with santyl to wound bed Secondary Dressing: ABD Pad, 8x10 (Generic) 3 x Per Week/30 Days Discharge Instructions: Apply over primary dressing as directed. Secondary Dressing: Woven Gauze Sponge, Non-Sterile 4x4 in (Generic) 3 x Per Week/30 Days Discharge Instructions: Apply over primary dressing as directed. Com pression Wrap: FourPress (4 layer compression wrap) (Generic) 3 x Per Week/30 Days Discharge Instructions: Apply four layer compression as directed. May also use Urgo K2 compression system as alternative. WOUND #6: -  Lower Leg Wound Laterality: Right, Anterior Peri-Wound Care: Triamcinolone 15 (g) 3 x Per Week/30 Days Discharge Instructions: Use triamcinolone 15 (g) ( in clinic) Peri-Wound Care: Zinc Oxide Ointment 30g tube 3 x Per Week/30 Days Discharge Instructions: Apply Zinc Oxide to periwound with each dressing change as needed for excoriation Peri-Wound Care: Sween Lotion (Moisturizing lotion) 3 x Per Week/30 Days Discharge Instructions: Apply moisturizing lotion as directed Prim Dressing: sodium thiosulfate ointment 3 x Per Week/30 Days ary Discharge Instructions: to wound bed with each dressing change Secondary Dressing: ABD Pad, 5x9 (Generic) 3 x Per Week/30 Days Discharge Instructions: Apply over primary dressing as directed. Secondary Dressing: Woven Gauze Sponge, Non-Sterile 4x4 in (Generic) 3 x Per Week/30 Days Discharge Instructions: Apply over primary dressing as directed. Com pression Wrap: FourPress (4 layer compression wrap) (Generic) 3 x Per Week/30 Days Discharge Instructions: Apply four layer compression as directed. May also use Urgo K2 compression system as alternative. 1. In office sharp debridement 2. Santyl and sodium thiosulfate under 4-layer compression to the right lower extremity 3. Silver alginate to the dorsal left foot wound under 4-layer compression to the left lower extremity 4. Follow-up in 1 week Electronic Signature(s) Signed: 10/07/2022 3:34:03 PM By: [name] DO Entered By: [name] on 10/06/2022 13:45:25 -------------------------------------------------------------------------------- HxROS Details Patient Name: Date of Service: [name] 10/06/2022 12:30 PM Medical Record Number: [id number] Patient Account Number: [id number] Date of Birth/Sex: Treating RN: September 30, 1950 (72 y.o. M) Primary Care Provider: PA [name], NO Other Clinician: Referring Provider: Treating Provider/Extender: Sandra Cockayne in Treatment:  34 Information Obtained From Patient Chart Constitutional Symptoms (General Health) Medical History: Past Medical History Notes: obesity Eyes Medical History: Positive for: Cataracts - right eye removed Negative for: Glaucoma; Optic Neuritis Ear/Nose/Mouth/Throat Medical History: Negative for: Chronic sinus problems/congestion; Middle ear problems Past Medical History NotesXAYVIER, [name] ([id number]) [id number] Page 14 of 15 hard of hearing Hematologic/Lymphatic Medical History: Positive for: Anemia - macrocytic; Lymphedema Respiratory Medical History: Past Medical History Notes: frozen diaphragm right Cardiovascular Medical History: Positive for: Congestive Heart Failure; Deep Vein Thrombosis - left leg; Hypertension; Peripheral Venous Disease Past Medical History Notes: hyperlipidemia Endocrine Medical History: Negative for: Type I Diabetes; Type II Diabetes Genitourinary Medical History: Negative for: End Stage Renal Disease Integumentary (Skin) Medical History: Negative for: History of Burn Musculoskeletal Medical History: Past Medical History Notes: myositis, lumbar DDD, spinal stenosis, cervical facet joint syndrome Oncologic Medical History: Negative for: Received Chemotherapy; Received Radiation Psychiatric Medical History: Negative for: Anorexia/bulimia; Confinement Anxiety HBO Extended History Items Eyes: Cataracts Immunizations Pneumococcal Vaccine: Received Pneumococcal Vaccination: Yes Received Pneumococcal Vaccination On or After 60th Birthday: Yes Implantable Devices No devices added Hospitalization / Surgery History  Type of Hospitalization/Surgery cervical fusion lumbar surgery coronary stent placement lasik eye surgery hydrocele excision Family and Social History Cancer: Yes - Mother; Diabetes: No; Heart Disease: Yes - Father,Mother; Hereditary Spherocytosis: No; Hypertension: Yes - Father,Siblings;  Kidney Disease: No; Lung Disease: Yes - Siblings; Seizures: No; Stroke: No; Thyroid Problems: No; Tuberculosis: No; Former smoker - quit 1991; Marital Status - Married; Alcohol Use: Moderate; Drug Use: No History; Caffeine Use: Daily - coffee; Financial Concerns: No; Food, Clothing or Shelter Needs: No; Support System Lacking: No; Transportation Concerns: No STORMY, SABOL ([id number]) [id number] Page 15 of 15 Electronic Signature(s) Signed: 10/07/2022 3:34:03 PM By: [name] DO Entered By: [name] on 10/06/2022 13:42:06 -------------------------------------------------------------------------------- SuperBill Details Patient Name: Date of Service: [name], [name] 10/06/2022 Medical Record Number: [id number] Patient Account Number: [id number] Date of Birth/Sex: Treating RN: [birth year] (72 y.o. M) Primary Care Provider: PA [name], NO Other Clinician: Referring Provider: Treating Provider/Extender: Sandra Cockayne in Treatment: 34 Diagnosis Coding ICD-10 Codes Code Description [phone] Non-pressure chronic ulcer of other part of right lower leg with fat layer exposed L97.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) I10 Essential (primary) hypertension I25.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 I89.0 Lymphedema, not elsewhere classified Z86.718 Personal history of other venous thrombosis and embolism Facility Procedures : CPT4 Code: [id number] Description: 11042 - DEB SUBQ TISSUE 20 SQ CM/< ICD-10 Diagnosis Description L97.812 Non-pressure chronic ulcer of other part of right lower leg with fat layer exp L97.522 Non-pressure chronic ulcer of other part of left foot with fat layer exposed I87.2  Venous insufficiency (chronic) (peripheral) Modifier: osed Quantity: 1 Physician  Procedures : CPT4 Code Description Modifier [id number] - WC PHYS LEVEL 3 - EST PT ICD-10 Diagnosis Description 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ed I89.0 Lymphedema, not elsewhere classified L94.2 Calcinosis  cutis 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) Quantity: 1 : [id number] 11042 - WC PHYS SUBQ TISS 20 SQ CM ICD-10 Diagnosis Description 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ed L97.522 Non-pressure chronic ulcer of other part of left foot with fat layer exposed  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) Quantity: 1 Electronic Signature(s) Signed: 10/07/2022 3:34:03 PM By: [name] DO Entered By: [name] on 10/06/2022 13:46:00

## 2022-10-06 NOTE — Progress Notes (Addendum)
[name] ([id number]) [id number] Page 1 of 11 Visit Report for 10/06/2022 Arrival Information Details Patient Name: Date of Service: [name], [name] 10/06/2022 12:30 PM Medical Record Number: [id number] Patient Account Number: [id number] Date of Birth/Sex: Treating RN: 25-May-1951 (72 y.o. [name] Primary Care Ervie Mccard: PA [name], NO Other Clinician: Referring Tristine Langi: Treating Malli Falotico/Extender: Sandra Cockayne in Treatment: 34 Visit Information History Since Last Visit All ordered tests and consults were completed: Yes Patient Arrived: Wheel Chair Added or deleted any medications: No Arrival Time: 12:31 Any new allergies or adverse reactions: No Accompanied By: self Had a fall or experienced change in No Transfer Assistance: None activities of daily living that may affect Patient Identification Verified: Yes risk of falls: Secondary Verification Process Completed: Yes Signs or symptoms of abuse/neglect since last visito No Patient Requires Transmission-Based Precautions: No Hospitalized since last visit: No Patient Has Alerts: Yes Has Dressing in Place as Prescribed: Yes Patient Alerts: R ABI= N/C, TBI= .75 Pain Present Now: No L ABI =N/C, TBI = .57 Electronic Signature(s) Signed: 11/11/2022 1:55:51 PM By: Brenton Grills Entered By: Brenton Grills on 10/06/2022 12:34:21 -------------------------------------------------------------------------------- Compression Therapy Details Patient Name: Date of Service: [name], [name] 10/06/2022 12:30 PM Medical Record Number: [id number] Patient Account Number: [id number] Date of Birth/Sex: Treating RN: 14-Jan-1951 (72 y.o. [name] Primary Care Ayannah Faddis: PA [name], [place] Other Clinician: Referring Coleston Dirosa: Treating Chantavia Bazzle/Extender: Sandra Cockayne in Treatment: 34 Compression Therapy Performed for Wound Assessment: Wound #1 Left,Dorsal Foot Performed  By: Clinician Zenaida Deed, RN Compression Type: Four Layer Post Procedure Diagnosis Same as Pre-procedure Electronic Signature(s) Signed: 10/06/2022 5:03:32 PM By: Zenaida Deed RN, BSN Entered By: Zenaida Deed on 10/06/2022 12:58:28 -------------------------------------------------------------------------------- Compression Therapy Details Patient Name: Date of Service: [name], [name] 10/06/2022 12:30 PM Medical Record Number: [id number] Patient Account Number: [id number] Date of Birth/Sex: Treating RN: April 01, 1951 (72 y.o. [name] Primary Care Iosefa Weintraub: PA [name], [place] Other Clinician: Referring Lalani Winkles: Treating Ettamae Barkett/Extender: Sandra Cockayne in Treatment: 34 Compression Therapy Performed for Wound Assessment: Wound #3 Right,Medial Lower Leg Performed By: Clinician Zenaida Deed, RN Compression Type: Four Layer Post Procedure Diagnosis Same as [name] ([id number]) [id number] Page 2 of 11 Electronic Signature(s) Signed: 10/06/2022 5:03:32 PM By: Zenaida Deed RN, BSN Entered By: Zenaida Deed on 10/06/2022 12:58:28 -------------------------------------------------------------------------------- Encounter Discharge Information Details Patient Name: Date of Service: [name], [name] 10/06/2022 12:30 PM Medical Record Number: [id number] Patient Account Number: [id number] Date of Birth/Sex: Treating RN: 1950/11/01 (72 y.o. [name] Primary Care [name]: PA [name], NO Other Clinician: Referring Mikaele Stecher: Treating Samaia Iwata/Extender: Sandra Cockayne in Treatment: 28 Encounter Discharge Information Items Post Procedure Vitals Discharge Condition: Stable Temperature (F): 98 Ambulatory Status: Wheelchair Pulse (bpm): 96 Discharge Destination: Home Respiratory Rate (breaths/min): 18 Transportation: Private Auto Blood Pressure (mmHg): 153/80 Accompanied By:  self Schedule Follow-up Appointment: Yes Clinical Summary of Care: Patient Declined Electronic Signature(s) Signed: 11/11/2022 1:55:51 PM By: Brenton Grills Entered By: Brenton Grills on 10/06/2022 13:29:59 -------------------------------------------------------------------------------- Lower Extremity Assessment Details Patient Name: Date of Service: [name] 10/06/2022 12:30 PM Medical Record Number: [id number] Patient Account Number: [id number] Date of Birth/Sex: Treating RN: 06-Sep-1950 (72 y.o. [name] Primary Care Thao Vanover: PA [name], NO Other Clinician: Referring Rasaan Brotherton: Treating Devetta Hagenow/Extender: Sal[place] April Weeks in Treatment: 34 Edema Assessment Assessed: [Left: No] [Right: No] Edema: [Left: Yes] [Right: Yes] Calf Left: Right: Point of Measurement: From Medial Instep 40.5 cm 39.2 cm Ankle  Left: Right: Point of Measurement: From Medial Instep 23.7 cm 26.5 cm Vascular Assessment Pulses: Dorsalis Pedis Palpable: [Left:Yes] [Right:Yes] Electronic Signature(s) Signed: 11/11/2022 1:55:51 PM By: Brenton Grills Entered By: Brenton Grills on 10/06/2022 12:38:48 -------------------------------------------------------------------------------- Multi Wound Chart Details Patient Name: Date of Service: [name] 10/06/2022 12:30 PM Medical Record Number: [id number] Patient Account Number: [id number] [name] ([id number]) [id number] Page 3 of 11 Date of Birth/Sex: Treating RN: 25-Jan-1951 (72 y.o. M) Primary Care Sharan Mcenaney: Other Clinician: PA [name], NO Referring Abundio Teuscher: Treating Mima Cranmore/Extender: Sandra Cockayne in Treatment: 34 Vital Signs Height(in): 71 Pulse(bpm): 96 Weight(lbs): 267 Blood Pressure(mmHg): 153/80 Body Mass Index(BMI): 37.2 Temperature(F): 98 Respiratory Rate(breaths/min): 18 [1:Photos:] Left, Dorsal Foot Right, Medial Lower Leg Right, Anterior Lower Leg Wound  Location: Gradually Appeared Gradually Appeared Gradually Appeared Wounding Event: Lymphedema Lymphedema Calcinosis Primary Etiology: Cataracts, Anemia, Lymphedema, Cataracts, Anemia, Lymphedema, Cataracts, Anemia, Lymphedema, Comorbid History: Congestive Heart Failure, Deep Vein Congestive Heart Failure, Deep Vein Congestive Heart Failure, Deep Vein Thrombosis, Hypertension, Peripheral Thrombosis, Hypertension, Peripheral Thrombosis, Hypertension, Peripheral Venous Disease Venous Disease Venous Disease 12/07/2021 12/07/2021 05/12/2022 Date Acquired: 34 34 21 Weeks of Treatment: Open Open Open Wound Status: No No No Wound Recurrence: 0.5x0.6x0.1 5.2x5.6x0.4 0.6x0.6x0.3 Measurements L x W x D (cm) 0.236 22.871 0.283 A (cm) : rea 0.024 9.148 0.085 Volume (cm) : 93.80% -328.20% -19.90% % Reduction in A rea: 98.40% -328.30% -254.20% % Reduction in Volume: Full Thickness Without Exposed Full Thickness With Exposed Support Full Thickness Without Exposed Classification: Support Structures Structures Support Structures Small Medium Medium Exudate A mount: Serous Serosanguineous Serosanguineous Exudate Type: amber red, brown red, brown Exudate Color: Distinct, outline attached Distinct, outline attached Epibole Wound Margin: Large (67-100%) Medium (34-66%) Small (1-33%) Granulation A mount: Red Red Red Granulation Quality: Small (1-33%) Medium (34-66%) Small (1-33%) Necrotic A mount: Fat Layer (Subcutaneous Tissue): Yes Fat Layer (Subcutaneous Tissue): Yes Fat Layer (Subcutaneous Tissue): Yes Exposed Structures: Fascia: No Fascia: No Fascia: No Tendon: No Tendon: No Tendon: No Muscle: No Muscle: No Muscle: No Joint: No Joint: No Joint: No Bone: No Bone: No Bone: No Small (1-33%) Small (1-33%) None Epithelialization: Debridement - Excisional Debridement - Excisional Debridement - Excisional Debridement: Pre-procedure Verification/Time Out 13:05 13:05  13:05 Taken: Lidocaine 4% Topical Solution Lidocaine 4% Topical Solution Lidocaine 4% Topical Solution Pain Control: Subcutaneous, Slough Subcutaneous, Slough Subcutaneous, Slough Tissue Debrided: Skin/Subcutaneous Tissue Skin/Subcutaneous Tissue Skin/Subcutaneous Tissue Level: 0.3 14 0.36 Debridement A (sq cm): rea Curette Curette Curette Instrument: Minimum Minimum Minimum Bleeding: Pressure Pressure Pressure Hemostasis A chieved: 0 0 0 Procedural Pain: 0 0 0 Post Procedural Pain: Procedure was tolerated well Procedure was tolerated well Procedure was tolerated well Debridement Treatment Response: 0.5x0.6x0.1 5.2x5.6x0.4 0.6x0.6x0.3 Post Debridement Measurements L x W x D (cm) 0.024 9.148 0.085 Post Debridement Volume: (cm) Excoriation: No Excoriation: No Excoriation: No Periwound Skin Texture: Induration: No Callus: No Crepitus: No Rash: No Scarring: No Maceration: No Maceration: No Maceration: No Periwound Skin Moisture: Dry/Scaly: No Dry/Scaly: No Dry/Scaly: No Hemosiderin Staining: Yes Hemosiderin Staining: Yes Hemosiderin Staining: Yes Periwound Skin Color: Erythema: No Erythema: No Atrophie Blanche: No Rubor: No Rubor: No Cyanosis: No DAVINDER, HAFF ([id number]) [id number] Page 4 of 11 Ecchymosis: No Erythema: No Mottled: No Pallor: No Rubor: No No Abnormality No Abnormality No Abnormality Temperature: Yes Yes Yes Tenderness on Palpation: Compression Therapy Compression Therapy Debridement Procedures Performed: Debridement Debridement Treatment Notes Wound #1 (Foot) Wound Laterality: Dorsal, Left Cleanser Peri-Wound Care Zinc Oxide Ointment 30g tube Discharge Instruction: Apply  Zinc Oxide to periwound with each dressing change as needed for excoriation Sween Lotion (Moisturizing lotion) Discharge Instruction: Apply moisturizing lotion as directed Topical Primary Dressing Sorbalgon AG Dressing, 4x4  (in/in) Discharge Instruction: or equivalent silver alginate. Apply to wound bed as instructed Secondary Dressing ABD Pad, 5x9 Discharge Instruction: Apply over primary dressing as directed. Woven Gauze Sponge, Non-Sterile 4x4 in Discharge Instruction: Apply over primary dressing as directed. Secured With Compression Wrap FourPress (4 layer compression wrap) Discharge Instruction: Apply four layer compression as directed. May also use Urgo K2 compression system as alternative. Compression Stockings Add-Ons Wound #3 (Lower Leg) Wound Laterality: Right, Medial Cleanser Peri-Wound Care Triamcinolone 15 (g) Discharge Instruction: Use triamcinolone 15 (g) as directed Zinc Oxide Ointment 30g tube Discharge Instruction: Apply Zinc Oxide to excoriated periwound with each dressing change as needed Sween Lotion (Moisturizing lotion) Discharge Instruction: Apply moisturizing lotion as directed Topical Primary Dressing Santyl Ointment Discharge Instruction: to wound bed mixed 1:1 with sodium thiosulfate ointment sodium thiosulfate ointment Discharge Instruction: 1:1 mix with santyl to wound bed Secondary Dressing ABD Pad, 8x10 Discharge Instruction: Apply over primary dressing as directed. Woven Gauze Sponge, Non-Sterile 4x4 in Discharge Instruction: Apply over primary dressing as directed. Secured With Compression Wrap FourPress (4 layer compression wrap) Discharge Instruction: Apply four layer compression as directed. May also use Urgo K2 compression system as alternative. ANDRIEL, VICINI ([id number]) [id number] Page 5 of 11 Compression Stockings Add-Ons Wound #6 (Lower Leg) Wound Laterality: Right, Anterior Cleanser Peri-Wound Care Triamcinolone 15 (g) Discharge Instruction: Use triamcinolone 15 (g) ( in clinic) Zinc Oxide Ointment 30g tube Discharge Instruction: Apply Zinc Oxide to periwound with each dressing change as needed for excoriation Sween Lotion  (Moisturizing lotion) Discharge Instruction: Apply moisturizing lotion as directed Topical Primary Dressing sodium thiosulfate ointment Discharge Instruction: to wound bed with each dressing change Secondary Dressing ABD Pad, 5x9 Discharge Instruction: Apply over primary dressing as directed. Woven Gauze Sponge, Non-Sterile 4x4 in Discharge Instruction: Apply over primary dressing as directed. Secured With Compression Wrap FourPress (4 layer compression wrap) Discharge Instruction: Apply four layer compression as directed. May also use Urgo K2 compression system as alternative. Compression Stockings Add-Ons Electronic Signature(s) Signed: 10/07/2022 3:34:03 PM By: [name] DO Entered By: [name] on 10/06/2022 13:39:57 -------------------------------------------------------------------------------- Multi-Disciplinary Care Plan Details Patient Name: Date of Service: RANKIN, NOVEMBER 10/06/2022 12:30 PM Medical Record Number: [id number] Patient Account Number: [id number] Date of Birth/Sex: Treating RN: 07/26/1951 (72 y.o. [name] Primary Care Denson Niccoli: PA [name], [place] Other Clinician: Referring Garyn Waguespack: Treating Havier Deeb/Extender: Sandra Cockayne in Treatment: 80 Multidisciplinary Care Plan reviewed with physician Active Inactive Venous Leg Ulcer Nursing Diagnoses: Knowledge deficit related to disease process and management Potential for venous Insuffiency (use before diagnosis confirmed) Goals: Patient will maintain optimal edema control Date Initiated: 02/17/2022 Target Resolution Date: 10/27/2022 Goal Status: Active Interventions: Assess peripheral edema status every visit. Compression as ordered KUNTE, CARDA ([id number]) [id number] Page 6 of 11 Provide education on venous insufficiency Treatment Activities: Non-invasive vascular studies : 02/17/2022 Therapeutic compression applied :  02/17/2022 Notes: Wound/Skin Impairment Nursing Diagnoses: Impaired tissue integrity Knowledge deficit related to ulceration/compromised skin integrity Goals: Patient/caregiver will verbalize understanding of skin care regimen Date Initiated: 02/17/2022 Target Resolution Date: 10/27/2022 Goal Status: Active Ulcer/skin breakdown will have a volume reduction of 30% by week 4 Date Initiated: 02/17/2022 Date Inactivated: 03/10/2022 Target Resolution Date: 03/10/2022 Unmet Reason: infection being treated, Goal Status: Unmet waiting on derm appte Ulcer/skin breakdown will have a volume reduction of  50% by week 8 Date Initiated: 03/10/2022 Date Inactivated: 04/14/2022 Target Resolution Date: 04/07/2022 Goal Status: Unmet Unmet Reason: calcinosis Ulcer/skin breakdown will have a volume reduction of 80% by week 12 Date Initiated: 04/14/2022 Date Inactivated: 05/05/2022 Target Resolution Date: 05/05/2022 Unmet Reason: infection, chronic Goal Status: Unmet condition Interventions: Assess patient/caregiver ability to obtain necessary supplies Assess patient/caregiver ability to perform ulcer/skin care regimen upon admission and as needed Assess ulceration(s) every visit Provide education on ulcer and skin care Treatment Activities: Skin care regimen initiated : 02/17/2022 Topical wound management initiated : 02/17/2022 Notes: Electronic Signature(s) Signed: 10/06/2022 11:58:58 AM By: Zenaida Deed RN, BSN Entered By: Zenaida Deed on 10/06/2022 11:41:05 -------------------------------------------------------------------------------- Pain Assessment Details Patient Name: Date of Service: [name], [name] 10/06/2022 12:30 PM Medical Record Number: [id number] Patient Account Number: [id number] Date of Birth/Sex: Treating RN: October 23, 1950 (72 y.o. [name] Primary Care Yukiko Minnich: PA [name], NO Other Clinician: Referring Valiant Dills: Treating Sibley Rolison/Extender: Sal[place] April Weeks in Treatment: 34 Active Problems Location of Pain Severity and Description of Pain Patient Has Paino No Site Locations [place], [place] ([id number]) [id number] Page 7 of 11 Pain Management and Medication Current Pain Management: Electronic Signature(s) Signed: 11/11/2022 1:55:51 PM By: Brenton Grills Entered By: Brenton Grills on 10/06/2022 12:35:15 -------------------------------------------------------------------------------- Patient/Caregiver Education Details Patient Name: Date of Service: [name], [name] 3/13/2024andnbsp12:30 PM Medical Record Number: [id number] Patient Account Number: [id number] Date of Birth/Gender: Treating RN: Feb 14, 1951 (72 y.o. [name] Primary Care Physician: PA [name], [place] Other Clinician: Referring Physician: Treating Physician/Extender: Sandra Cockayne in Treatment: 60 Education Assessment Education Provided To: Patient Education Topics Provided Venous: Methods: Explain/Verbal Responses: Reinforcements needed, State content correctly Wound/Skin Impairment: Methods: Explain/Verbal Responses: Reinforcements needed, State content correctly Electronic Signature(s) Signed: 10/06/2022 11:58:58 AM By: Zenaida Deed RN, BSN Entered By: Zenaida Deed on 10/06/2022 11:41:26 -------------------------------------------------------------------------------- Wound Assessment Details Patient Name: Date of Service: [name], [name] 10/06/2022 12:30 PM Medical Record Number: [id number] Patient Account Number: [id number] Date of Birth/Sex: Treating RN: 1951-06-01 (72 y.o. [name] Primary Care Rakim Moone: PA [name], [place] Other Clinician: Referring Ameia Morency: Treating Alonia Dibuono/Extender: [place] April Weeks in Treatment: 34 Wound Status Wound Number: 1 Primary Lymphedema Etiology: Wound Location: Left, Dorsal Foot EANN, CLELAND ([id number]) [id number] Page  8 of 11 Wound Open Wounding Event: Gradually Appeared Status: Date Acquired: 12/07/2021 Comorbid Cataracts, Anemia, Lymphedema, Congestive Heart Failure, Deep Weeks Of Treatment: 34 History: Vein Thrombosis, Hypertension, Peripheral Venous Disease Clustered Wound: No Photos Wound Measurements Length: (cm) 0.5 Width: (cm) 0.6 Depth: (cm) 0.1 Area: (cm) 0.236 Volume: (cm) 0.024 % Reduction in Area: 93.8% % Reduction in Volume: 98.4% Epithelialization: Small (1-33%) Tunneling: No Undermining: No Wound Description Classification: Full Thickness Without Exposed Suppor Wound Margin: Distinct, outline attached Exudate Amount: Small Exudate Type: Serous Exudate Color: amber t Structures Foul Odor After Cleansing: No Slough/Fibrino Yes Wound Bed Granulation Amount: Large (67-100%) Exposed Structure Granulation Quality: Red Fascia Exposed: No Necrotic Amount: Small (1-33%) Fat Layer (Subcutaneous Tissue) Exposed: Yes Necrotic Quality: Adherent Slough Tendon Exposed: No Muscle Exposed: No Joint Exposed: No Bone Exposed: No Periwound Skin Texture Texture Color No Abnormalities Noted: No No Abnormalities Noted: No Excoriation: No Erythema: No Hemosiderin Staining: Yes Moisture Rubor: No No Abnormalities Noted: No Dry / Scaly: No Temperature / Pain Maceration: No Temperature: No Abnormality Tenderness on Palpation: Yes Electronic Signature(s) Signed: 10/06/2022 5:03:32 PM By: Zenaida Deed RN, BSN Signed: 11/11/2022 1:55:51 PM By: Brenton Grills Entered By: Zenaida Deed on 10/06/2022 12:50:53 -------------------------------------------------------------------------------- Wound  Assessment Details Patient Name: Date of Service: [name], [name] 10/06/2022 12:30 PM Medical Record Number: [id number] Patient Account Number: [id number] Date of Birth/Sex: Treating RN: [birth year] (72 y.o. [name] Primary Care Kirin Brandenburger: PA [name], NO Other Clinician: Referring  Rad Gramling: Treating Rukiya Hodgkins/Extender: Sal[place] April Weeks in Treatment: 34 Wound Status Wound Number: 3 Primary Lymphedema Etiology: Wound Location: Right, Medial Lower Leg Wound Open YOMAR, MEJORADO ([id number]) [id number] Page 9 of 11 Wound Open Wounding Event: Gradually Appeared Status: Date Acquired: 12/07/2021 Comorbid Cataracts, Anemia, Lymphedema, Congestive Heart Failure, Deep Weeks Of Treatment: 34 History: Vein Thrombosis, Hypertension, Peripheral Venous Disease Clustered Wound: No Photos Wound Measurements Length: (cm) 5.2 Width: (cm) 5.6 Depth: (cm) 0.4 Area: (cm) 22.871 Volume: (cm) 9.148 % Reduction in Area: -328.2% % Reduction in Volume: -328.3% Epithelialization: Small (1-33%) Tunneling: No Undermining: No Wound Description Classification: Full Thickness With Exposed Support Structures Wound Margin: Distinct, outline attached Exudate Amount: Medium Exudate Type: Serosanguineous Exudate Color: red, brown Foul Odor After Cleansing: No Slough/Fibrino Yes Wound Bed Granulation Amount: Medium (34-66%) Exposed Structure Granulation Quality: Red Fascia Exposed: No Necrotic Amount: Medium (34-66%) Fat Layer (Subcutaneous Tissue) Exposed: Yes Necrotic Quality: Adherent Slough Tendon Exposed: No Muscle Exposed: No Joint Exposed: No Bone Exposed: No Periwound Skin Texture Texture Color No Abnormalities Noted: Yes No Abnormalities Noted: No Erythema: No Moisture Hemosiderin Staining: Yes No Abnormalities Noted: No Rubor: No Dry / Scaly: No Maceration: No Temperature / Pain Temperature: No Abnormality Tenderness on Palpation: Yes Electronic Signature(s) Signed: 10/06/2022 5:03:32 PM By: Zenaida Deed RN, BSN Signed: 11/11/2022 1:55:51 PM By: Brenton Grills Entered By: Zenaida Deed on 10/06/2022 12:52:25 -------------------------------------------------------------------------------- Wound Assessment  Details Patient Name: Date of Service: [name], [name] 10/06/2022 12:30 PM Medical Record Number: [id number] Patient Account Number: [id number] Date of Birth/Sex: Treating RN: Dec 12, 1950 (72 y.o. [name] Primary Care Aariyana Manz: PA [name], NO Other Clinician: Referring Yanilen Adamik: Treating Paras Kreider/Extender: [place] April Weeks in Treatment: 34 Wound Status Wound Number: 6 Primary Calcinosis Etiology: Wound Location: Right, Anterior Lower Leg Wound Open Wounding Event: Gradually AMBER, GUTHRIDGE ([id number]) [id number] Page 10 of 11 Wounding Event: Gradually Appeared Status: Date Acquired: 05/12/2022 Comorbid Cataracts, Anemia, Lymphedema, Congestive Heart Failure, Deep Weeks Of Treatment: 21 History: Vein Thrombosis, Hypertension, Peripheral Venous Disease Clustered Wound: No Photos Wound Measurements Length: (cm) 0.6 Width: (cm) 0.6 Depth: (cm) 0.3 Area: (cm) 0.283 Volume: (cm) 0.085 % Reduction in Area: -19.9% % Reduction in Volume: -254.2% Epithelialization: None Wound Description Classification: Full Thickness Without Exposed Suppor Wound Margin: Epibole Exudate Amount: Medium Exudate Type: Serosanguineous Exudate Color: red, brown t Structures Foul Odor After Cleansing: No Slough/Fibrino Yes Wound Bed Granulation Amount: Small (1-33%) Exposed Structure Granulation Quality: Red Fascia Exposed: No Necrotic Amount: Small (1-33%) Fat Layer (Subcutaneous Tissue) Exposed: Yes Necrotic Quality: Adherent Slough Tendon Exposed: No Muscle Exposed: No Joint Exposed: No Bone Exposed: No Periwound Skin Texture Texture Color No Abnormalities Noted: Yes No Abnormalities Noted: No Atrophie Blanche: No Moisture Cyanosis: No No Abnormalities Noted: Yes Ecchymosis: No Erythema: No Hemosiderin Staining: Yes Mottled: No Pallor: No Rubor: No Temperature / Pain Temperature: No Abnormality Tenderness on Palpation:  Yes Electronic Signature(s) Signed: 10/06/2022 5:03:32 PM By: Zenaida Deed RN, BSN Signed: 11/11/2022 1:55:51 PM By: Brenton Grills Entered By: Zenaida Deed on 10/06/2022 12:53:01 -------------------------------------------------------------------------------- Vitals Details Patient Name: Date of Service: [name], [name] 10/06/2022 12:30 PM Medical Record Number: [id number] Patient Account Number: [id number] Date of Birth/Sex: Treating RN: 07-10-1951 (72 y.o. [name] Primary  Care Melyssa Signor: PA [name], [place] Other Clinician: Referring Chanz Cahall: Treating Kina Shiffman/Extender: Sandra Cockayne in Treatment: [address] [place] ([id number]) [id number] Page 11 of 11 Vital Signs Time Taken: 12:30 Temperature (F): 98 Height (in): 71 Pulse (bpm): 96 Weight (lbs): 267 Respiratory Rate (breaths/min): 18 Body Mass Index (BMI): 37.2 Blood Pressure (mmHg): 153/80 Reference Range: 80 - 120 mg / dl Electronic Signature(s) Signed: 11/11/2022 1:55:51 PM By: Brenton Grills Entered By: Brenton Grills on 10/06/2022 12:34:54

## 2022-10-08 DIAGNOSIS — L942 Calcinosis cutis: Secondary | ICD-10-CM | POA: Diagnosis not present

## 2022-10-08 DIAGNOSIS — I872 Venous insufficiency (chronic) (peripheral): Secondary | ICD-10-CM | POA: Diagnosis not present

## 2022-10-08 DIAGNOSIS — L97522 Non-pressure chronic ulcer of other part of left foot with fat layer exposed: Secondary | ICD-10-CM | POA: Diagnosis not present

## 2022-10-08 DIAGNOSIS — I10 Essential (primary) hypertension: Secondary | ICD-10-CM | POA: Diagnosis not present

## 2022-10-08 DIAGNOSIS — L03116 Cellulitis of left lower limb: Secondary | ICD-10-CM | POA: Diagnosis not present

## 2022-10-08 DIAGNOSIS — L97812 Non-pressure chronic ulcer of other part of right lower leg with fat layer exposed: Secondary | ICD-10-CM | POA: Diagnosis not present

## 2022-10-11 DIAGNOSIS — L97522 Non-pressure chronic ulcer of other part of left foot with fat layer exposed: Secondary | ICD-10-CM | POA: Diagnosis not present

## 2022-10-11 DIAGNOSIS — L942 Calcinosis cutis: Secondary | ICD-10-CM | POA: Diagnosis not present

## 2022-10-11 DIAGNOSIS — I10 Essential (primary) hypertension: Secondary | ICD-10-CM | POA: Diagnosis not present

## 2022-10-11 DIAGNOSIS — L03116 Cellulitis of left lower limb: Secondary | ICD-10-CM | POA: Diagnosis not present

## 2022-10-11 DIAGNOSIS — L97812 Non-pressure chronic ulcer of other part of right lower leg with fat layer exposed: Secondary | ICD-10-CM | POA: Diagnosis not present

## 2022-10-11 DIAGNOSIS — I872 Venous insufficiency (chronic) (peripheral): Secondary | ICD-10-CM | POA: Diagnosis not present

## 2022-10-13 ENCOUNTER — Encounter (HOSPITAL_BASED_OUTPATIENT_CLINIC_OR_DEPARTMENT_OTHER): Payer: Medicare Other | Admitting: General Surgery

## 2022-10-13 DIAGNOSIS — I872 Venous insufficiency (chronic) (peripheral): Secondary | ICD-10-CM | POA: Diagnosis not present

## 2022-10-13 DIAGNOSIS — L97522 Non-pressure chronic ulcer of other part of left foot with fat layer exposed: Secondary | ICD-10-CM | POA: Diagnosis not present

## 2022-10-13 DIAGNOSIS — I890 Lymphedema, not elsewhere classified: Secondary | ICD-10-CM | POA: Diagnosis not present

## 2022-10-13 DIAGNOSIS — L97812 Non-pressure chronic ulcer of other part of right lower leg with fat layer exposed: Secondary | ICD-10-CM | POA: Diagnosis not present

## 2022-10-13 DIAGNOSIS — I251 Atherosclerotic 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: Secondary | ICD-10-CM | POA: Diagnosis not present

## 2022-10-13 DIAGNOSIS — L942 Calcinosis cutis: Secondary | ICD-10-CM | POA: Diagnosis not present

## 2022-10-14 NOTE — Progress Notes (Signed)
[name] ([id number]) [id number] Page 1 of 11 Visit Report for 10/13/2022 Arrival Information Details Patient Name: Date of Service: [name], [name] 10/13/2022 1:15 PM Medical Record Number: [id number] Patient Account Number: [id number] Date of Birth/Sex: Treating RN: [name] (72 y.o. M) Primary Care [name]: PA [name], NO Other Clinician: Referring Hamna Asa: Treating [name]/Extender: [name] in Treatment: 58 Visit Information History Since Last Visit All ordered tests and consults were completed: No Patient Arrived: Wheel Chair Added or deleted any medications: No Arrival Time: 13:22 Any new allergies or adverse reactions: No Accompanied By: self Had a fall or experienced change in No Transfer Assistance: None activities of daily living that may affect Patient Identification Verified: Yes risk of falls: Secondary Verification Process Completed: Yes Signs or symptoms of abuse/neglect since last visito No Patient Requires Transmission-Based Precautions: No Hospitalized since last visit: No Patient Has Alerts: Yes Implantable device outside of the clinic excluding No Patient Alerts: R ABI= N/C, TBI= .75 cellular tissue based products placed in the center L ABI =N/C, TBI = .57 since last visit: Has Dressing in Place as Prescribed: Yes Has Compression in Place as Prescribed: Yes Pain Present Now: No Electronic Signature(s) Signed: 10/13/2022 5:57:17 PM By: [name] Gouty RN, BSN Entered By: [name] on 10/13/2022 13:45:49 -------------------------------------------------------------------------------- Compression Therapy Details Patient Name: Date of Service: [name], [name] 10/13/2022 1:15 PM Medical Record Number: [id number] Patient Account Number: [id number] Date of Birth/Sex: Treating RN: 1951/07/26 (72 y.o. [name] Primary Care [name]: PA [name], [place] Other Clinician: Referring [name]: Treating  [name]/Extender: [name] in Treatment: 35 Compression Therapy Performed for Wound Assessment: Wound #1 Left,Dorsal Foot Performed By: Clinician [name] Gouty, RN Compression Type: Double Layer Post Procedure Diagnosis Same as Pre-procedure Electronic Signature(s) Signed: 10/13/2022 5:57:17 PM By: [name] Gouty RN, BSN Entered By: [name] on 10/13/2022 14:02:37 -------------------------------------------------------------------------------- Compression Therapy Details Patient Name: Date of Service: [name], [name] 10/13/2022 1:15 PM Medical Record Number: [id number] Patient Account Number: [id number] Date of Birth/Sex: Treating RN: [name][birth year] (72 y.o. [name] Primary Care [name]: PA [name], [place] Other Clinician: Referring [name]: Treating [name]/Extender: [name] in Treatment: 35 Compression Therapy Performed for Wound Assessment: Wound #3 Right,Medial Lower Leg Performed By: Clinician [name] Gouty, RN Compression Type: Double Layer Post Procedure Diagnosis [name] ([id number] Page 2 of 11 Same as Pre-procedure Electronic Signature(s) Signed: 10/13/2022 5:57:17 PM By: [name] Gouty RN, BSN Entered By: [name] on 10/13/2022 14:02:37 -------------------------------------------------------------------------------- Encounter Discharge Information Details Patient Name: Date of Service: [name], [name] 10/13/2022 1:15 PM Medical Record Number: [id number] Patient Account Number: [id number] Date of Birth/Sex: Treating RN: [name][birth year] (72 y.o. [name] Primary Care [name]: PA [name], [place] Other Clinician: Referring [name]: Treating [name]: [name] in Treatment: 35 Encounter Discharge Information Items Post Procedure Vitals Discharge Condition: Stable Temperature (F): 98.6 Ambulatory Status:  Wheelchair Pulse (bpm): 74 Discharge Destination: Home Respiratory Rate (breaths/min): 18 Transportation: Private Auto Blood Pressure (mmHg): 149/54 Accompanied By: self Schedule Follow-up Appointment: Yes Clinical Summary of Care: Patient Declined Electronic Signature(s) Signed: 10/13/2022 5:57:17 PM By: [name] Gouty RN, BSN Entered By: [name] on 10/13/2022 16:49:01 -------------------------------------------------------------------------------- Lower Extremity Assessment Details Patient Name: Date of Service: [name], [name] 10/13/2022 1:15 PM Medical Record Number: [id number] Patient Account Number: [id number] Date of Birth/Sex: Treating RN: [name] (72 y.o. [name] Primary Care [name]: PA [name], [place] Other Clinician: Referring [name]: Treating [name]/Extender: [name], [name] in  Treatment: 35 Edema Assessment Assessed: [Left: No] [Right: No] Edema: [Left: Yes] [Right: Yes] Calf Left: Right: [name] of Measurement: From Medial Instep 41 cm 41.5 cm Ankle Left: Right: [name] of Measurement: From Medial Instep 24 cm 25.5 cm Vascular Assessment Pulses: Dorsalis Pedis Palpable: [Left:Yes] [Right:Yes] Electronic Signature(s) Signed: 10/13/2022 5:57:17 PM By: [name] Gouty RN, BSN Entered By: [name] on 10/13/2022 13:48:14 [name] ([id number] Page 3 of 11 -------------------------------------------------------------------------------- Multi Wound Chart Details Patient Name: Date of Service: [name], [name] 10/13/2022 1:15 PM Medical Record Number: [id number] Patient Account Number: [id number] Date of Birth/Sex: Treating RN: [birth year]-Oct-[name] (72 y.o. M) Primary Care [name]: PA [name], NO Other Clinician: Referring [name]: Treating [name]/Extender: [name], [name] in Treatment: 35 Vital Signs Height(in): 71 Pulse(bpm): 74 Weight(lbs): 78 Blood Pressure(mmHg):  149/54 Body Mass Index(BMI): 37.2 Temperature(F): 98.6 Respiratory Rate(breaths/min): 20 [1:Photos:] Left, Dorsal Foot Right, Medial Lower Leg Right, Anterior Lower Leg Wound Location: Gradually Appeared Gradually Appeared Gradually Appeared Wounding Event: Lymphedema Lymphedema Calcinosis Primary Etiology: Cataracts, Anemia, Lymphedema, Cataracts, Anemia, Lymphedema, Cataracts, Anemia, Lymphedema, Comorbid History: Congestive Heart Failure, Deep Vein Congestive Heart Failure, Deep Vein Congestive Heart Failure, Deep Vein Thrombosis, Hypertension, Peripheral Thrombosis, Hypertension, Peripheral Thrombosis, Hypertension, Peripheral Venous Disease Venous Disease Venous Disease 12/07/2021 12/07/2021 05/12/2022 Date Acquired: 53 35 22 Weeks of Treatment: Open Open Open Wound Status: No No No Wound Recurrence: 0.8x1x0.1 5.5x5x0.5 0.4x0.4x0.3 Measurements L x W x D (cm) 0.628 21.598 0.126 A (cm) : rea 0.063 10.799 0.038 Volume (cm) : 83.40% -304.40% 46.60% % Reduction in A rea: 95.80% -405.60% -58.30% % Reduction in Volume: Full Thickness Without Exposed Full Thickness With Exposed Support Full Thickness Without Exposed Classification: Support Structures Structures Support Structures Medium Medium Medium Exudate A mount: Serous Serosanguineous Serosanguineous Exudate Type: amber red, brown red, brown Exudate Color: Distinct, outline attached Distinct, outline attached Epibole Wound Margin: Large (67-100%) Medium (34-66%) Small (1-33%) Granulation A mount: Red Red Red Granulation Quality: Small (1-33%) Medium (34-66%) Small (1-33%) Necrotic A mount: Fat Layer (Subcutaneous Tissue): Yes Fat Layer (Subcutaneous Tissue): Yes Fat Layer (Subcutaneous Tissue): Yes Exposed Structures: Fascia: No Fascia: No Fascia: No Tendon: No Tendon: No Tendon: No Muscle: No Muscle: No Muscle: No Joint: No Joint: No Joint: No Bone: No Bone: No Bone: No Small (1-33%) Small  (1-33%) None Epithelialization: Debridement - Selective/Open Wound Debridement - Excisional Debridement - Selective/Open Wound Debridement: Pre-procedure Verification/Time Out 14:00 14:00 14:00 Taken: N/A Lidocaine 4% Topical Solution Lidocaine 4% Topical Solution Pain Control: Necrotic/Eschar Subcutaneous, [employer] Tissue Debrided: Skin/Epidermis Skin/Subcutaneous Tissue Non-Viable Tissue Level: 0.8 27.5 0.16 Debridement A (sq cm): rea Curette Curette Curette Instrument: Minimum Minimum Minimum Bleeding: Pressure Pressure Pressure Hemostasis A chieved: 0 0 0 Procedural Pain: 0 0 0 Post Procedural Pain: Procedure was tolerated well Procedure was tolerated well Procedure was tolerated well Debridement Treatment Response: 0.8x1x0.1 5.5x5x0.5 0.4x0.4x0.3 Post Debridement Measurements L x W x D (cm) 0.063 10.799 0.038 Post Debridement Volume: (cm) Excoriation: No Excoriation: No Excoriation: No Periwound Skin Texture: Induration: No Callus: No Crepitus: No Rash: No Scarring: No AMAURIS, [name] ([id number]) [id number].pdf Page 4 of 11 Dry/Scaly: Yes Maceration: No Maceration: No Periwound Skin Moisture: Maceration: No Dry/Scaly: No Dry/Scaly: No Hemosiderin Staining: Yes Hemosiderin Staining: Yes Hemosiderin Staining: Yes Periwound Skin Color: Erythema: No Erythema: No Atrophie Blanche: No Rubor: No Rubor: No Cyanosis: No Ecchymosis: No Erythema: No Mottled: No Pallor: No Rubor: No No Abnormality No Abnormality No Abnormality Temperature: Yes Yes Yes Tenderness on Palpation: Compression Therapy Compression  Therapy Debridement Procedures Performed: Debridement Debridement Treatment Notes Electronic Signature(s) Signed: 10/13/2022 2:25:28 PM By: Fredirick Maudlin MD FACS Entered By: Fredirick Maudlin on 10/13/2022 14:25:28 -------------------------------------------------------------------------------- Multi-Disciplinary Care  Plan Details Patient Name: Date of Service: [name], [name] 10/13/2022 1:15 PM Medical Record Number: [id number] Patient Account Number: [id number] Date of Birth/Sex: Treating RN: [birth year]-[name] (72 y.o. [name] Primary Care Caedyn Raygoza: PA [name], [place] Other Clinician: Referring Sarahbeth Cashin: Treating Brendi Mccarroll/Extender: [name] in Treatment: 29 Multidisciplinary Care Plan reviewed with physician Active Inactive Venous Leg Ulcer Nursing Diagnoses: Knowledge deficit related to disease process and management Potential for venous Insuffiency (use before diagnosis confirmed) Goals: Patient will maintain optimal edema control Date Initiated: 02/17/2022 Target Resolution Date: 10/27/2022 Goal Status: Active Interventions: Assess peripheral edema status every visit. Compression as ordered Provide education on venous insufficiency Treatment Activities: Non-invasive vascular studies : 02/17/2022 Therapeutic compression applied : 02/17/2022 Notes: Wound/Skin Impairment Nursing Diagnoses: Impaired tissue integrity Knowledge deficit related to ulceration/compromised skin integrity Goals: Patient/caregiver will verbalize understanding of skin care regimen Date Initiated: 02/17/2022 Target Resolution Date: 10/27/2022 Goal Status: Active Ulcer/skin breakdown will have a volume reduction of 30% by week 4 Date Initiated: 02/17/2022 Date Inactivated: 03/10/2022 Target Resolution Date: 03/10/2022 Unmet Reason: infection being treated, Goal Status: Unmet waiting on derm appte Ulcer/skin breakdown will have a volume reduction of 50% by week 8 Date Initiated: 03/10/2022 Date Inactivated: 04/14/2022 Target Resolution Date: 04/07/2022 MERREL, FERRAIOLI ([id number]) [phone].pdf Page 5 of 11 Goal Status: Unmet Unmet Reason: calcinosis Ulcer/skin breakdown will have a volume reduction of 80% by week 12 Date Initiated: 04/14/2022 Date Inactivated:  05/05/2022 Target Resolution Date: 05/05/2022 Unmet Reason: infection, chronic Goal Status: Unmet condition Interventions: Assess patient/caregiver ability to obtain necessary supplies Assess patient/caregiver ability to perform ulcer/skin care regimen upon admission and as needed Assess ulceration(s) every visit Provide education on ulcer and skin care Treatment Activities: Skin care regimen initiated : 02/17/2022 Topical wound management initiated : 02/17/2022 Notes: Electronic Signature(s) Signed: 10/13/2022 5:57:17 PM By: [name] Gouty RN, BSN Entered By: [name] on 10/13/2022 13:55:02 -------------------------------------------------------------------------------- Pain Assessment Details Patient Name: Date of Service: [name], [name] 10/13/2022 1:15 PM Medical Record Number: [id number] Patient Account Number: [id number] Date of Birth/Sex: Treating RN: [birth year] [name] (72 y.o. M) Primary Care Johnnetta Holstine: PA [name], [place] Other Clinician: Referring Doss Cybulski: Treating Cy Bresee/Extender: [name] in Treatment: 35 Active Problems Location of Pain Severity and Description of Pain Patient Has Paino No Site Locations Rate the pain. Current Pain Level: 0 Pain Management and Medication Current Pain Management: Electronic Signature(s) Signed: 10/13/2022 5:57:17 PM By: [name] Gouty RN, BSN Entered By: [name] on 10/13/2022 13:46:20 -------------------------------------------------------------------------------- Patient/Caregiver Education Details Patient Name: Date of Service: [name], [name] 3/20/2024andnbsp1:15 PM Medical Record Number: [id number] Patient Account Number: [id number] Date of Birth/Gender: Treating RN: [birth year] [name] (72 y.o. [name] Primary Care Physician: [address] [place] Other Clinician: ZABDI, KASAL ([id number]) [id number] Page 6 of 11 Referring Physician: Treating Physician/Extender: [name] in Treatment: 35 Education Assessment Education Provided To: Patient Education Topics Provided Venous: Methods: Explain/Verbal Responses: Reinforcements needed, State content correctly Wound/Skin Impairment: Methods: Explain/Verbal Responses: Reinforcements needed, State content correctly Electronic Signature(s) Signed: 10/13/2022 5:57:17 PM By: [name] Gouty RN, BSN Entered By: [name] on 10/13/2022 13:55:31 -------------------------------------------------------------------------------- Wound Assessment Details Patient Name: Date of Service: [name], [name] 10/13/2022 1:15 PM Medical Record Number: [id number] Patient Account Number: [id number] Date of Birth/Sex: Treating RN: [birth year]-May-[name] (72 y.o. M) Primary Care Almedia Cordell: PA [name],  NO Other Clinician: Referring Alena Blankenbeckler: Treating Dash Cardarelli/Extender: [name], [name] in Treatment: 35 Wound Status Wound Number: 1 Primary Lymphedema Etiology: Wound Location: Left, Dorsal Foot Wound Open Wounding Event: Gradually Appeared Status: Date Acquired: 12/07/2021 Comorbid Cataracts, Anemia, Lymphedema, Congestive Heart Failure, Deep Weeks Of Treatment: 35 History: Vein Thrombosis, Hypertension, Peripheral Venous Disease Clustered Wound: No Photos Wound Measurements Length: (cm) 0.8 Width: (cm) 1 Depth: (cm) 0.1 Area: (cm) 0.628 Volume: (cm) 0.063 % Reduction in Area: 83.4% % Reduction in Volume: 95.8% Epithelialization: Small (1-33%) Tunneling: No Undermining: No Wound Description Classification: Full Thickness Without Exposed Suppor Wound Margin: Distinct, outline attached Exudate Amount: Medium Exudate Type: Serous Exudate Color: amber t Structures Foul Odor After Cleansing: No Slough/Fibrino Yes Wound Bed [place], [name] ([id number] Page 7 of 11 Granulation Amount: Large (67-100%) Exposed Structure Granulation Quality:  Red Fascia Exposed: No Necrotic Amount: Small (1-33%) Fat Layer (Subcutaneous Tissue) Exposed: Yes Necrotic Quality: Adherent Slough Tendon Exposed: No Muscle Exposed: No Joint Exposed: No Bone Exposed: No Periwound Skin Texture Texture Color No Abnormalities Noted: Yes No Abnormalities Noted: No Erythema: No Moisture Hemosiderin Staining: Yes No Abnormalities Noted: No Rubor: No Dry / Scaly: Yes Maceration: No Temperature / Pain Temperature: No Abnormality Tenderness on Palpation: Yes Treatment Notes Wound #1 (Foot) Wound Laterality: Dorsal, Left Cleanser Peri-Wound Care Zinc Oxide Ointment 30g tube Discharge Instruction: Apply Zinc Oxide to periwound with each dressing change as needed for excoriation Sween Lotion (Moisturizing lotion) Discharge Instruction: Apply moisturizing lotion as directed Topical Primary Dressing Sorbal[place] AG Dressing, 4x4 (in/in) Discharge Instruction: or equivalent silver alginate. Apply to wound bed as instructed Secondary Dressing ABD Pad, 5x9 Discharge Instruction: Apply over primary dressing as directed. Woven Gauze Sponge, Non-Sterile 4x4 in Discharge Instruction: Apply over primary dressing as directed. Secured With Compression Wrap FourPress (4 layer compression wrap) Discharge Instruction: Apply four layer compression as directed. May also use Urgo K2 compression system as alternative. Compression Stockings Add-Ons Electronic Signature(s) Signed: 10/13/2022 5:57:17 PM By: [name] Gouty RN, BSN Entered By: [name] on 10/13/2022 13:50:16 -------------------------------------------------------------------------------- Wound Assessment Details Patient Name: Date of Service: [name], [name] 10/13/2022 1:15 PM Medical Record Number: [id number] Patient Account Number: [id number] Date of Birth/Sex: Treating RN: 16-Dec-[name] (72 y.o. M) Primary Care Calton [place]: PA [name], [place] Other Clinician: Referring Cylinda Santoli: Treating  Gleen Ripberger/Extender: [name] in Treatment: 35 Wound Status Wound Number: 3 Primary Lymphedema Etiology: Wound Location: Right, Medial Lower Leg Wound Open Wounding Event: Gradually Appeared Status: Date Acquired: 12/07/2021 Comorbid Cataracts, Anemia, Lymphedema, Congestive Heart Failure, Deep Weeks Of Treatment: 35 History: Vein Thrombosis, Hypertension, Peripheral Venous Disease [place], Jodeci ([id number].pdf Page 8 of 11 History: Vein Thrombosis, Hypertension, Peripheral Venous Disease Clustered Wound: No Photos Wound Measurements Length: (cm) 5.5 Width: (cm) 5 Depth: (cm) 0.5 Area: (cm) 21.598 Volume: (cm) 10.799 % Reduction in Area: -304.4% % Reduction in Volume: -405.6% Epithelialization: Small (1-33%) Tunneling: No Undermining: No Wound Description Classification: Full Thickness With Exposed Suppo Wound Margin: Distinct, outline attached Exudate Amount: Medium Exudate Type: Serosanguineous Exudate Color: red, brown rt Structures Foul Odor After Cleansing: No Slough/Fibrino Yes Wound Bed Granulation Amount: Medium (34-66%) Exposed Structure Granulation Quality: Red Fascia Exposed: No Necrotic Amount: Medium (34-66%) Fat Layer (Subcutaneous Tissue) Exposed: Yes Necrotic Quality: Adherent Slough Tendon Exposed: No Muscle Exposed: No Joint Exposed: No Bone Exposed: No Periwound Skin Texture Texture Color No Abnormalities Noted: Yes No Abnormalities Noted: No Erythema: No Moisture Hemosiderin Staining: Yes No Abnormalities Noted: No Rubor: No Dry /  Scaly: No Maceration: No Temperature / Pain Temperature: No Abnormality Tenderness on Palpation: Yes Treatment Notes Wound #3 (Lower Leg) Wound Laterality: Right, Medial Cleanser Peri-Wound Care Triamcinolone 15 (g) Discharge Instruction: Use triamcinolone 15 (g) as directed Zinc Oxide Ointment 30g tube Discharge Instruction: Apply Zinc Oxide to  excoriated periwound with each dressing change as needed Sween Lotion (Moisturizing lotion) Discharge Instruction: Apply moisturizing lotion as directed Topical Primary Dressing Santyl Ointment Discharge Instruction: to wound bed mixed 1:1 with sodium thiosulfate ointment sodium thiosulfate ointment Discharge Instruction: 1:1 mix with santyl to wound bed Secondary Dressing ABSALON, MANNIS ([id number]) [id number] Page 9 of 11 ABD Pad, 8x10 Discharge Instruction: Apply over primary dressing as directed. Woven Gauze Sponge, Non-Sterile 4x4 in Discharge Instruction: Apply over primary dressing as directed. Secured With Compression Wrap FourPress (4 layer compression wrap) Discharge Instruction: Apply four layer compression as directed. May also use Urgo K2 compression system as alternative. Compression Stockings Add-Ons Electronic Signature(s) Signed: 10/13/2022 5:57:17 PM By: [name] Gouty RN, BSN Entered By: [name] on 10/13/2022 13:52:14 -------------------------------------------------------------------------------- Wound Assessment Details Patient Name: Date of Service: [name], [name] 10/13/2022 1:15 PM Medical Record Number: [id number] Patient Account Number: [id number] Date of Birth/Sex: Treating RN: [birth year][name] (72 y.o. [name] Primary Care Anthonymichael Munday: PA [name], [place] Other Clinician: Referring Rainier Feuerborn: Treating Sailor Haughn/Extender: [name] in Treatment: 35 Wound Status Wound Number: 6 Primary Calcinosis Etiology: Wound Location: Right, Anterior Lower Leg Wound Open Wounding Event: Gradually Appeared Status: Date Acquired: 05/12/2022 Comorbid Cataracts, Anemia, Lymphedema, Congestive Heart Failure, Deep Weeks Of Treatment: 22 History: Vein Thrombosis, Hypertension, Peripheral Venous Disease Clustered Wound: No Photos Wound Measurements Length: (cm) 0.4 Width: (cm) 0.4 Depth: (cm) 0.3 Area: (cm)  0.126 Volume: (cm) 0.038 % Reduction in Area: 46.6% % Reduction in Volume: -58.3% Epithelialization: None Tunneling: No Undermining: No Wound Description Classification: Full Thickness Without Exposed Suppor Wound Margin: Epibole Exudate Amount: Medium Exudate Type: Serosanguineous Exudate Color: red, brown t Structures Foul Odor After Cleansing: No Slough/Fibrino Yes Wound Bed Granulation Amount: Small (1-33%) Exposed Structure Granulation Quality: Red Fascia Exposed: No Necrotic Amount: Small (1-33%) Fat Layer (Subcutaneous Tissue) Exposed: Yes Necrotic Quality: Adherent Slough Tendon Exposed: No Muscle Exposed: No Joint Exposed: No [place], [name] ([id number].pdf Page 10 of 11 Bone Exposed: No Periwound Skin Texture Texture Color No Abnormalities Noted: Yes No Abnormalities Noted: No Atrophie Blanche: No Moisture Cyanosis: No No Abnormalities Noted: Yes Ecchymosis: No Erythema: No Hemosiderin Staining: Yes Mottled: No Pallor: No Rubor: No Temperature / Pain Temperature: No Abnormality Tenderness on Palpation: Yes Treatment Notes Wound #6 (Lower Leg) Wound Laterality: Right, Anterior Cleanser Peri-Wound Care Triamcinolone 15 (g) Discharge Instruction: Use triamcinolone 15 (g) ( in clinic) Zinc Oxide Ointment 30g tube Discharge Instruction: Apply Zinc Oxide to periwound with each dressing change as needed for excoriation Sween Lotion (Moisturizing lotion) Discharge Instruction: Apply moisturizing lotion as directed Topical Primary Dressing sodium thiosulfate ointment Discharge Instruction: to wound bed with each dressing change Secondary Dressing ABD Pad, 5x9 Discharge Instruction: Apply over primary dressing as directed. Woven Gauze Sponge, Non-Sterile 4x4 in Discharge Instruction: Apply over primary dressing as directed. Secured With Compression Wrap FourPress (4 layer compression wrap) Discharge Instruction: Apply  four layer compression as directed. May also use Urgo K2 compression system as alternative. Compression Stockings Add-Ons Electronic Signature(s) Signed: 10/13/2022 5:57:17 PM By: [name] Gouty RN, BSN Entered By: [name] on 10/13/2022 13:58:06 -------------------------------------------------------------------------------- Vitals Details Patient Name: Date of Service: [name] 10/13/2022 1:15 PM Medical Record  Number: [id number] Patient Account Number: [id number] Date of Birth/Sex: Treating RN: [birth year] (72 y.o. M) Primary Care Kunio Cummiskey: PA [name], [place] Other Clinician: Referring Danel Studzinski: Treating Kamoni Gentles/Extender: [name] in Treatment: 35 Vital Signs Time Taken: 01:23 Temperature (F): 98.6 Height (in): 71 Pulse (bpm): 74 Weight (lbs): 267 Respiratory Rate (breaths/min): 20 Lemay, Andriel ([id number]) [id number].pdf Page 11 of 11 Body Mass Index (BMI): 37.2 Blood Pressure (mmHg): 149/54 Reference Range: 80 - 120 mg / dl Electronic Signature(s) Signed: 10/13/2022 2:37:33 PM By: Worthy Rancher Entered By: Worthy Rancher on 10/13/2022 13:23:20

## 2022-10-14 NOTE — Progress Notes (Signed)
JACORION, KLEM ([id number]) [id number] Page 1 of 16 Visit Report for 10/13/2022 Chief Complaint Document Details Patient Name: Date of Service: [name], [name] 10/13/2022 1:15 PM Medical Record Number: [id number] Patient Account Number: [id number] Date of Birth/Sex: Treating RN: 02/06/1951 (72 y.o. M) Primary Care Provider: PA [name], NO Other Clinician: Referring Provider: Treating Provider/Extender: [name] in Treatment: 35 Information Obtained from: Patient Chief Complaint Patient seen for complaints of Non-Healing Wounds. Electronic Signature(s) Signed: 10/13/2022 2:25:38 PM By: Fredirick Maudlin MD FACS Entered By: Fredirick Maudlin on 10/13/2022 14:25:38 -------------------------------------------------------------------------------- Debridement Details Patient Name: Date of Service: [name], [name] 10/13/2022 1:15 PM Medical Record Number: [id number] Patient Account Number: [id number] Date of Birth/Sex: Treating RN: Nov 18, 1950 (72 y.o. [name] Primary Care Provider: PA [name], [place] Other Clinician: Referring Provider: Treating Provider/Extender: [name] in Treatment: 35 Debridement Performed for Assessment: Wound #1 Left,Dorsal Foot Performed By: Physician Fredirick Maudlin, MD Debridement Type: Debridement Level of Consciousness (Pre-procedure): Awake and Alert Pre-procedure Verification/Time Out Yes - 14:00 Taken: Start Time: 14:01 T Area Debrided (L x W): otal 0.8 (cm) x 1 (cm) = 0.8 (cm) Tissue and other material debrided: Non-Viable, Eschar, Skin: Epidermis Level: Skin/Epidermis Debridement Description: Selective/Open Wound Instrument: Curette Bleeding: Minimum Hemostasis Achieved: Pressure Procedural Pain: 0 Post Procedural Pain: 0 Response to Treatment: Procedure was tolerated well Level of Consciousness (Post- Awake and Alert procedure): Post Debridement Measurements of Total  Wound Length: (cm) 0.8 Width: (cm) 1 Depth: (cm) 0.1 Volume: (cm) 0.063 Character of Wound/Ulcer Post Debridement: Improved Post Procedure Diagnosis Same as Pre-procedure Notes scribed by Baruch Gouty, RN for Dr. [name] Electronic Signature(s) Signed: 10/13/2022 3:48:41 PM By: Fredirick Maudlin MD FACS Signed: 10/13/2022 5:57:17 PM By: Baruch Gouty RN, BSN Entered By: Baruch Gouty on 10/13/2022 14:04:28 [name] ([id number]) [id number] Page 2 of 16 -------------------------------------------------------------------------------- Debridement Details Patient Name: Date of Service: [name], [name] 10/13/2022 1:15 PM Medical Record Number: [id number] Patient Account Number: [id number] Date of Birth/Sex: Treating RN: Dec 03, 1950 (72 y.o. [name] Primary Care Provider: PA [name], [place] Other Clinician: Referring Provider: Treating Provider/Extender: [name] in Treatment: 35 Debridement Performed for Assessment: Wound #6 Right,Anterior Lower Leg Performed By: Physician Fredirick Maudlin, MD Debridement Type: Debridement Level of Consciousness (Pre-procedure): Awake and Alert Pre-procedure Verification/Time Out Yes - 14:00 Taken: Start Time: 14:01 Pain Control: Lidocaine 4% T opical Solution T Area Debrided (L x W): otal 0.4 (cm) x 0.4 (cm) = 0.16 (cm) Tissue and other material debrided: Non-Viable, Slough, Slough Level: Non-Viable Tissue Debridement Description: Selective/Open Wound Instrument: Curette Bleeding: Minimum Hemostasis Achieved: Pressure Procedural Pain: 0 Post Procedural Pain: 0 Response to Treatment: Procedure was tolerated well Level of Consciousness (Post- Awake and Alert procedure): Post Debridement Measurements of Total Wound Length: (cm) 0.4 Width: (cm) 0.4 Depth: (cm) 0.3 Volume: (cm) 0.038 Character of Wound/Ulcer Post Debridement: Improved Post Procedure Diagnosis Same as  Pre-procedure Notes scribed for Dr. [name] by Baruch Gouty, RN Electronic Signature(s) Signed: 10/13/2022 3:48:41 PM By: Fredirick Maudlin MD FACS Signed: 10/13/2022 5:57:17 PM By: Baruch Gouty RN, BSN Entered By: Baruch Gouty on 10/13/2022 14:05:40 -------------------------------------------------------------------------------- Debridement Details Patient Name: Date of Service: [name], [name] 10/13/2022 1:15 PM Medical Record Number: [id number] Patient Account Number: [id number] Date of Birth/Sex: Treating RN: 01/30/1951 (72 y.o. [name] Primary Care Provider: PA [name], [place] Other Clinician: Referring Provider: Treating Provider/Extender: [name] in Treatment: 35 Debridement Performed for Assessment: Wound #3 Right,Medial Lower Leg Performed  By: Physician Fredirick Maudlin, MD Debridement Type: Debridement Level of Consciousness (Pre-procedure): Awake and Alert Pre-procedure Verification/Time Out Yes - 14:00 Taken: Start Time: 14:01 Pain Control: Lidocaine 4% T opical Solution T Area Debrided (L x W): otal 5.5 (cm) x 5 (cm) = 27.5 (cm) Tissue and other material debrided: Viable, Non-Viable, Slough, Subcutaneous, Slough Level: Skin/Subcutaneous Tissue Debridement Description: Excisional Instrument: Curette Bleeding: Minimum Hemostasis Achieved: Pressure Wiegert, Booker ([id number]) [phone] Page 3 of 16 Procedural Pain: 0 Post Procedural Pain: 0 Response to Treatment: Procedure was tolerated well Level of Consciousness (Post- Awake and Alert procedure): Post Debridement Measurements of Total Wound Length: (cm) 5.5 Width: (cm) 5 Depth: (cm) 0.5 Volume: (cm) 10.799 Character of Wound/Ulcer Post Debridement: Improved Post Procedure Diagnosis Same as Pre-procedure Notes scribed for Dr. [name] by Baruch Gouty, RN Electronic Signature(s) Signed: 10/13/2022 3:48:41 PM By: Fredirick Maudlin MD FACS Signed:  10/13/2022 5:57:17 PM By: Baruch Gouty RN, BSN Entered By: Baruch Gouty on 10/13/2022 14:06:39 -------------------------------------------------------------------------------- HPI Details Patient Name: Date of Service: [name], [name] 10/13/2022 1:15 PM Medical Record Number: [id number] Patient Account Number: [id number] Date of Birth/Sex: Treating RN: May 10, 1951 (72 y.o. M) Primary Care Provider: PA [name], NO Other Clinician: Referring Provider: Treating Provider/Extender: [name] in Treatment: 35 History of Present Illness HPI Description: ADMISSION 02/09/2022 This is a 72 year old man who recently moved to [place] from [place]. He has a history of of coronary artery disease and prior left popliteal vein DVT . He is not diabetic and does not smoke. 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 He has had multiple spinal surgeries and has limited mobility. He primarily uses a motorized wheelchair. He has had lymphedema for many years and does have lymphedema pumps although he says he does not use them frequently. He has wounds on his bilateral lower extremities. He was receiving care in [place] for these prior to his move. They were apparently packing his wounds with Betadine soaked gauze. He has worn compression wraps in the past but not recently. He did say that they helped tremendously. In clinic today, his right ABI is noncompressible but has good signals; the left ABI was 1.17. No formal vascular studies have been performed. On his left dorsal foot, there is a circular lesion that exposes the fat layer. There is fairly heavy slough accumulation within the wound, but no significant odor. On his right medial calf, he has multiple pinholes that have slough buildup in them and probe for about 2 to 4 mm in depth. They are draining clear fluid. On his right lateral midfoot, he has ulceration consistent with venous stasis. It is geographic and has  slough accumulation. 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 02/17/2022: All of the wounds are little bit larger today and have more slough accumulation. Today, the medial calf openings are larger and probe to something that feels calcified. There is odor coming from his wounds. His vascular studies are scheduled for August 9 and 10. 02-24-2022 upon evaluation today patient presents for follow-up concerning his ongoing issues here with his lower extremities. With that being said he does have significant problems with what appears to be cellulitis unfortunately the PCR culture that was sent last week got crushed by [employer] in transit. With that being said we are going to reobtain a culture today although I am actually to do it on the right medial leg as this  seems to be the most inflamed area today where there is some questionable drainage as well. I am going to remove some of the calcium deposits which are loosening obtain a deeper culture from this area. Fortunately I do not see any evidence of active infection systemically which is good news but locally I think we are having a bigger issue here. He does have his appointment next Thursday with vascular. Patient has also been referred to [facility] for further evaluation and treatment of the calcinosis for possible sulfate injections. 03/04/2022: The culture that was performed last week grew out multiple species including Klebsiella, Morganella, and Pseudomonas aeruginosa. He had been prescribed Bactrim but this was not adequate to cover all of the species and levofloxacin was recommended. He completed the Bactrim but did not pick up the levofloxacin and begin taking it until yesterday. 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 He had arterial studies performed today. He does have evidence of peripheral arterial disease. Results copied here: +-------+-----------+-----------+------------+------------+ ABI/TBIT oday's ABIT oday's TBIPrevious ABIPrevious TBI +-------+-----------+-----------+------------+------------+ Right Hardy 0.75    +-------+-----------+-----------+------------+------------+ Left Girard 0.57    +-------+-----------+-----------+------------+------------+ [name] ([id number]) [phone].pdf Page 4 of 16 Arterial wall calcification precludes accurate ankle pressures and ABIs. Summary: Right: Resting right ankle-brachial index indicates noncompressible right lower extremity arteries. The right toe-brachial index is normal. Left: Resting left ankle-brachial index indicates noncompressible left lower extremity arteries. The left toe-brachial index is abnormal. He continues to have further tissue breakdown at the right medial calf and there is ample slough accumulation along with necrotic subcutaneous tissue. The left dorsal foot wound has built up slough, as well. The right medial foot wound is nearly closed with just a light layer of eschar over the surface. The right dorsal lateral foot wound has also accumulated a fair amount of slough and remains quite tender. 03/10/2022: He has been taking the prescribed levofloxacin and has about 3 days left of this. The erythema and induration on his right leg has improved. He continues to experience further breakdown of the right medial calf wound. There is extensive slough and debris accumulation there. There is heavy slough on his left dorsal foot wound, but no odor or purulent drainage. The right medial foot wound appears to be closed. The right dorsal lateral foot wound also has a fair amount of slough but it is less tender than at our last visit. He still does not have an appointment with  dermatology. 03/22/2022: The right anterior tibial wound has closed. The right lateral foot wound is nearly closed with just a little bit of slough. The left dorsal foot wound is smaller and continues to have some slough buildup but less so than at prior visits. There is good granulation tissue underlying the slough. Unfortunately the right medial calf wound continues to deteriorate. It has a foul odor today and a lot of necrotic tissue, the surrounding skin is also erythematous and moist. 03/30/2022: The right lateral foot wound continues to contract and just has a little bit of slough and eschar present. The left dorsal foot wound is also smaller with slough overlying good granulation tissue. The right medial calf wound actually looks quite a bit better today; there is no odor and there is much less necrotic tissue although some remains present. We have been using topical gentamicin and he has been taking oral Augmentin. 04/07/2022:  The patient saw dermatology at [facility] last week. Unfortunately, it appears that they did not read any of the documentation that was provided. 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 Overall, it was a highly unsatisfactory consultation. In the meantime, however, we were able to identify compounding pharmacy who could create a topical sodium thiosulfate ointment. The patient has that with him today. Both of the right foot wounds are now closed. The left dorsal foot wound has contracted but still has a layer of slough on the surface. The medial calf wounds on the right all look a little bit better. They still have heavy slough along with the chunks of calcium. Everything is stained purple. 04/14/2022: The  wound on his dorsal left foot is a little bit smaller again today. There is still slough accumulation on the surface. The medial calf wounds have quite a bit less slough accumulation today. His right leg, however, is warm and erythematous, concerning for cellulitis. 04/14/2022: He completed his course of Augmentin yesterday. The medial calf wound sites are much cleaner today and shallower. There is less fragmented calcium in the wounds. He has a lot of lymphedema fluid-like drainage from these wounds, but no purulent discharge or malodor. The wound on his dorsal left foot has also contracted and is shallower. There is a layer of slough over healthy granulation tissue. 05/05/2022: The left dorsal foot wound is smaller today with less slough and more granulation tissue. The right medial calf wounds are shallower, but have extensive slough and some fat necrosis. The drainage has diminished considerably, however. 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 05/12/2022: The left dorsal foot wound continues to contract and fill with granulation tissue. There is slough on the wound surface. The right medial calf wounds also continue to fill with healthy tissue, but there is remaining slough and fat necrosis present. He had a significant increase in his drainage this week and it was a bright yellow-green color. He also had a new wound open over his right anterior tibial surface associated with the spike of cutaneous calcium. The leg is more red, as well, concerning for infection. 05/19/2022: His culture returned positive for Pseudomonas. He is currently taking a course of oral levofloxacin. We have also been using topical gentamicin mixed in with his sodium thiosulfate. All of the wounds look better this week. 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 There  is slough and nonviable subcutaneous tissue still present. The new anterior tibial wound just has a light layer of slough. The dorsal foot wound also has some slough present and appears a little dry today. 05/26/2022: The right medial leg wound continues to improve. It is feeling with granulation tissue, but still accumulates a fairly thick layer of slough on the surface. The more recent anterior tibial wound has remained quite small, but also has some slough on the surface. The left dorsal foot wound has contracted somewhat and has perimeter epithelialization. He completed his oral levofloxacin. 06/02/2022: The left dorsal foot wound continues to improve significantly. There is just a little slough on the wound surface. The right medial leg wound had  black drainage, but it looks like silver alginate was applied last week and I theorized that silver sulfide was created with a combination of the silver alginate and the sodium thiosulfate. It did, however, have a bit of an odor to it and extensive slough accumulation. The small anterior tibial wound also had a bit of slough and nonviable subcutaneous tissue present. The skin of his leg was rather macerated, as well. 06/09/2022: The left dorsal foot wound is smaller again this week with just a light layer of slough on the surface. The right medial leg wound looks substantially better. The leg and periwound skin are no longer red and macerated. There is good granulation tissue forming with less slough and nonviable tissue. The anterior tibial wound just has a small amount of slough accumulation. 06/16/2022: The left dorsal foot wound continues to contract. His wrap slipped a little bit, however, and his foot is more swollen. The right medial leg wound continues to improve. The underlying tissue is more vital-appearing and there is less slough. He does have substantial drainage. The small anterior tibial wound has a bit of slough accumulation. 06/23/2022:  The left dorsal foot wound is about half the size as it was last week. There is just some light slough on the surface and some eschar buildup around the edges. The right anterior leg wound is small with a little slough on the surface. Unfortunately, the right medial leg wound deteriorated fairly substantially. It is malodorous with gray/green/black discoloration. 06/30/2022: The left dorsal foot wound continues to contract. It is nearly closed with just a light layer of slough present. The right anterior leg wound is about the same size and has a small amount of slough on the surface. The right medial leg wound looks a lot better than it did last week. The odor has abated and the tissue is less pulpy and necrotic-looking. There is still a fairly thick layer of slough present. 12/13; the left dorsal foot wound looks very healthy. There was no need to change the dressing here and no debridement. He has a small area on the right anterior lower leg apparently had not changed much in size. The area on the medial leg is the most problematic area this is large with some depth and a completely nonviable surface today. 07/14/2022: The left dorsal foot wound is smaller today with just a little slough and eschar present. The right anterior lower leg wound is about the same without any significant change. The medial leg wound has a black and green surface and is malodorous. No purulent drainage and the periwound skin is intact. Edema control is suboptimal. 07/21/2022: The dorsal foot wound continues to contract and is nearly closed with just a little bit of slough and eschar on the surface. The right anterior lower leg wound is shallower today but otherwise the dimensions are unchanged. The medial leg wound looks significantly better although it still has a nonviable surface; the odor and black/green surface has resolved. His culture came back with multiple species sensitive to Augmentin, which was prescribed and a  very AKIA, DINARDI ([id number]) [id number] Page 5 of 16 low level of Pseudomonas, which should be handled by the topical gentamicin we have been using. 07/28/2022: The dorsal foot wound is down to just a very tiny opening with a little eschar on the surface. The right anterior tibial wound is about the same with some slough buildup. The right medial leg wound looks quite a bit better this week. There is thick  rubbery slough on the surface, but the odor and drainage has improved significantly. 08/04/2022: The dorsal foot wound is almost healed, but the periwound skin has broken down. This appears to be secondary to moisture and adhesive from the absorbent pad. The right anterior tibial wound is shallower. The right medial leg wound continues to improve. It is smaller, still with rubbery slough on the surface. No malodor or purulent drainage. 08/11/2022: The dorsal foot wound is about the same size. It is a little bit dry with some slough accumulation. The right anterior tibial wound is also unchanged. The right medial leg wound is filling in further with good granulation tissue. Still with some slough buildup on the surface. 08/18/2022: His left leg is bright red, hot, and swollen with cellulitis. The dorsal foot wound actually looks quite good and is superficial with just a little bit of slough accumulation. The right anterior tibial wound is unchanged. The right medial leg wound has filled in with good granulation tissue but has copious amounts of drainage. 08/25/2022: His cellulitis has responded nicely to the oral antibiotics. His dorsal foot wound is smaller but has a fair amount of slough buildup. The right anterior tibial wound remains stable and unchanged. The right medial leg wound has more granulation tissue under a layer of slough, but continues to have copious drainage. The drainage is actually causing skin irritation and threatens to result in breakdown. 09/01/2022: He  continues to have profuse drainage from his wounds which is resulting in tissue maceration on both the dorsum of his left foot and the periwound distal to his medial leg wound. The dorsal foot wound is nearly healed, despite this. The medial right leg wound is filling in with good granulation tissue. We are working on getting home health assistance to change his dressings more frequently to try and control the drainage better. 09/08/2022: We have had success in controlling his drainage. The tissue maceration has improved significantly and his swelling has come down quite markedly. The medial right leg wound is much more superficial and has substantially less nonviable tissue present. The anterior tibial wound is a little bit larger, however with some nonviable tissue present. 09/15/2022: The dorsal foot wound is nearly closed. The anterior tibial wound measures slightly larger today. The medial right leg wound continues to improve quite dramatically with less nonviable tissue and more robust-looking granulation. Edema control is significantly better. 09/22/2022: The left dorsal foot wound remains open, albeit quite tiny. The anterior tibial wound is a little bit larger again today. The medial right leg wound continues to improve with an increase in robust and healthy granulation tissue and less accumulation of nonviable tissue. 09/29/2022: His home health providers wrapped his left leg extremely tightly and he ended up having to cut the wrap off of his foot. This resulted in his foot being very swollen and that the dorsal foot wound is a little bit larger today. The anterior tibial wound is about the same size with a little slough on the surface. The medial right leg wound continues to fill in. There are very few areas with any significant depth. There is still slough accumulation. 3/13; Patient has a left dorsal foot wound along with a right anterior tibial wound and right medial wound. We are using silver  alginate to the left dorsal foot wound, Santyl and sodium thiosulfate compound ointment to the right lower extremity wounds under 4 layer compression. Patient has no issues or complaints today. 10/13/2022: The left dorsal foot wound is  a little bit bigger today, but his foot is also a little bit more swollen. The right medial leg wound has filled in more and has substantially less depth. There is still some nonviable tissue present. The small anterior lower leg wound looks about the same. Electronic Signature(s) Signed: 10/13/2022 2:26:31 PM By: Fredirick Maudlin MD FACS Entered By: Fredirick Maudlin on 10/13/2022 14:26:31 -------------------------------------------------------------------------------- Physical Exam Details Patient Name: Date of Service: [name], [name] 10/13/2022 1:15 PM Medical Record Number: [id number] Patient Account Number: [id number] Date of Birth/Sex: Treating RN: [birth year] (72 y.o. M) Primary Care Provider: PA [name], NO Other Clinician: Referring Provider: Treating Provider/Extender: [name], [name] in Treatment: 35 Constitutional Slightly hypertensive. . . . no acute distress. Respiratory Normal work of breathing on room air. Notes 10/13/2022: The left dorsal foot wound is a little bit bigger today, but his foot is also a little bit more swollen. The right medial leg wound has filled in more and has substantially less depth. There is still some nonviable tissue present. The small anterior lower leg wound looks about the same. Electronic Signature(s) Signed: 10/13/2022 2:27:01 PM By: Fredirick Maudlin MD FACS Entered By: Fredirick Maudlin on 10/13/2022 14:27:01 Physician Orders Details -------------------------------------------------------------------------------- [name] ([id number]) [id number] Page 6 of 16 Patient Name: Date of Service: [name], [name] 10/13/2022 1:15 PM Medical Record Number: [id number] Patient  Account Number: [id number] Date of Birth/Sex: Treating RN: 1951-07-13 (72 y.o. 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 - Dr. [name] Rm 1 Return A Wednesday 3/27 @ 12:30 pm Anesthetic Wound #1 Left,Dorsal Foot (In clinic) Topical Lidocaine 4% applied to wound bed Wound #3 Right,Medial Lower Leg (In clinic) Topical Lidocaine 4% applied to wound bed Wound #6 Right,Anterior Lower Leg (In clinic) Topical Lidocaine 4% applied to wound bed Bathing/ Shower/ Hygiene May shower with protection but do not get wound dressing(s) wet. Protect dressing(s) with water repellant cover (for example, large plastic bag) or a cast cover and may then take shower. Edema Control - Lymphedema / SCD / Other Lymphedema Pumps. Use Lymphedema pumps on leg(s) 2-3 times a day for 45-60 minutes. If wearing any wraps or hose, do not remove them. Continue exercising as instructed. - use 1-2 times per day over wraps Elevate legs to the level of the heart or above for 30 minutes daily and/or when sitting for 3-4 times a day throughout the day. - throughout the day Avoid standing for long periods of time. Exercise regularly [place] wound care orders this week; continue Home Health for  wound care. May utilize formulary equivalent dressing for wound treatment orders unless otherwise specified. - Change right leg dressing only!! Dressing changes to be completed by [place] on Monday / Wednesday / Friday except when patient has scheduled visit at [facility] - pt seen in clinic on Wednesday Other [place] Orders/Instructions: - [facility] Wound Treatment Wound #1 - Foot Wound Laterality: Dorsal,  Left Peri-Wound Care: Zinc Oxide Ointment 30g tube 1 x Per Week/30 Days Discharge Instructions: Apply Zinc Oxide to periwound with each dressing change as needed for excoriation Peri-Wound Care: Sween Lotion (Moisturizing lotion) 1 x Per Week/30 Days Discharge Instructions: Apply moisturizing lotion as directed Prim Dressing: Sorbalgon AG Dressing, 4x4 (in/in) (Generic) 1 x Per Week/30 Days ary Discharge Instructions: or equivalent silver alginate. Apply to wound bed as instructed Secondary Dressing: ABD Pad, 5x9 (Generic) 1 x Per Week/30 Days Discharge Instructions: Apply over primary dressing as directed. Secondary Dressing: Woven Gauze Sponge, Non-Sterile 4x4 in (Generic) 1 x Per Week/30 Days Discharge Instructions: Apply over primary dressing as directed. Compression Wrap: FourPress (4 layer compression wrap) (Generic) 1 x Per Week/30 Days Discharge Instructions: Apply four layer compression as directed. May also use Urgo K2 compression system as alternative. Wound #3 - Lower Leg Wound Laterality: Right, Medial Peri-Wound Care: Triamcinolone 15 (g) 3 x Per Week/30 Days ROREY, RAUF ([id number]) [id number] Page 7 of 16 Discharge Instructions: Use triamcinolone 15 (g) as directed Peri-Wound Care: Zinc Oxide Ointment 30g tube 3 x Per Week/30 Days Discharge Instructions: Apply Zinc Oxide to excoriated periwound with each dressing change as needed Peri-Wound Care: Sween Lotion (Moisturizing lotion) 3 x Per Week/30 Days Discharge Instructions:  Apply moisturizing lotion as directed Prim Dressing: Santyl Ointment 3 x Per Week/30 Days ary Discharge Instructions: to wound bed mixed 1:1 with sodium thiosulfate ointment Prim Dressing: sodium thiosulfate ointment 3 x Per Week/30 Days ary Discharge Instructions: 1:1 mix with santyl to wound bed Secondary Dressing: ABD Pad, 8x10 (Generic) 3 x Per Week/30 Days Discharge Instructions: Apply over primary dressing as directed. Secondary Dressing: Woven Gauze Sponge, Non-Sterile 4x4 in (Generic) 3 x Per Week/30 Days Discharge Instructions: Apply over primary dressing as directed. Compression Wrap: FourPress (4 layer compression wrap) (Generic) 3 x Per Week/30 Days Discharge Instructions: Apply four layer compression as directed. May also use Urgo K2 compression system as alternative. Wound #6 - Lower Leg Wound Laterality: Right, Anterior Peri-Wound Care: Triamcinolone 15 (g) 3 x Per Week/30 Days Discharge Instructions: Use triamcinolone 15 (g) ( in clinic) Peri-Wound Care: Zinc Oxide Ointment 30g tube 3 x Per Week/30 Days Discharge Instructions: Apply Zinc Oxide to periwound with each dressing change as needed for excoriation Peri-Wound Care: Sween Lotion (Moisturizing lotion) 3 x Per Week/30 Days Discharge Instructions: Apply moisturizing lotion as directed Prim Dressing: sodium thiosulfate ointment 3 x Per Week/30 Days ary Discharge Instructions: to wound bed with each dressing change Secondary Dressing: ABD Pad, 5x9 (Generic) 3 x Per Week/30 Days Discharge Instructions: Apply over primary dressing as directed. Secondary Dressing: Woven Gauze Sponge, Non-Sterile 4x4 in (Generic) 3 x Per Week/30 Days Discharge Instructions: Apply over primary dressing as directed. Compression Wrap: FourPress (4 layer compression wrap) (Generic) 3 x Per Week/30 Days Discharge Instructions: Apply four layer compression as directed. May also use Urgo K2 compression system as alternative. Electronic  Signature(s) Signed: 10/13/2022 3:48:41 PM By: Fredirick Maudlin MD FACS Entered By: Fredirick Maudlin on 10/13/2022 14:27:26 -------------------------------------------------------------------------------- Problem List Details Patient Name: Date of Service: [name], [name] 10/13/2022 1:15 PM Medical Record Number: [id number] Patient Account Number: [id number] Date of Birth/Sex: Treating RN: July 24, 1951 (72 y.o. 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) 02/09/2022 No Yes I10 Essential (primary) hypertension 02/09/2022 No Yes I25.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 angina pectoris 02/09/2022 No Yes I89.0 Lymphedema, not elsewhere classified 02/09/2022 No Yes Z86.718 Personal history of other venous thrombosis and embolism 02/09/2022 No Yes Inactive Problems ICD-10 Code Description Active Date Inactive Date L97.512 Non-pressure chronic ulcer of other part of right foot with fat layer exposed 02/17/2022 02/17/2022 Resolved Problems Electronic Signature(s) Signed: 10/13/2022 2:23:39 PM By: Fredirick Maudlin MD FACS Entered By: Fredirick Maudlin on 10/13/2022 14:23:39 -------------------------------------------------------------------------------- Progress Note Details Patient Name: Date of Service: [name], [name] 10/13/2022 1:15 PM Medical Record Number: [id number] Patient Account Number:  [id number] Date of Birth/Sex: Treating RN: 1950-12-08 (72 y.o. M) Primary Care Provider: PA [name], [place] Other Clinician: Referring Provider: Treating Provider/Extender: [name] in Treatment: 35 Subjective Chief Complaint Information obtained from Patient Patient seen for complaints of Non-Healing Wounds. History of Present Illness (HPI) ADMISSION 02/09/2022 This is a 72 year old man who recently moved to [place] from [place]. He has a history of of coronary artery disease and prior left popliteal vein DVT . He is not diabetic and does not smoke. 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 He has had multiple spinal surgeries and has limited mobility. He primarily uses a motorized wheelchair. He has had lymphedema for many years and does have lymphedema pumps although he says he does not use them frequently. He has wounds on his bilateral lower extremities. He was receiving care in [place] for these prior to his move. They were apparently packing his wounds with Betadine soaked gauze. He has worn compression wraps in the past but not recently. He did say that they helped tremendously. In clinic today, his right ABI is noncompressible but has good signals; the left ABI was 1.17. No formal vascular studies have been performed. On his left dorsal foot, there is a circular lesion that exposes the fat layer. There is fairly heavy slough accumulation within the wound, but no significant odor. On his right medial calf, he has multiple pinholes that have slough buildup in them and probe for about 2 to 4 mm in depth. They are draining clear fluid. On his right lateral midfoot, he has ulceration consistent with venous stasis. It is geographic and has slough accumulation. 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 02/17/2022: All of the wounds are little bit larger today and  have more slough accumulation. Today, the medial calf openings are larger and probe to something that feels calcified. There is odor coming from his wounds. His vascular studies are scheduled for August 9 and 10. [name], [name] ([id number]) [id number] Page 9 of 16 02-24-2022 upon evaluation today patient presents for follow-up concerning his ongoing issues here with his lower extremities. With that being said he does have significant problems with what appears to be cellulitis unfortunately the PCR culture that was sent last week got crushed by [employer] in transit. With that being said we are going to reobtain a culture today although I am actually to do it on the right medial leg as this seems to be the most inflamed area today where there is some questionable drainage as well. I am going  to remove some of the calcium deposits which are loosening obtain a deeper culture from this area. Fortunately I do not see any evidence of active infection systemically which is good news but locally I think we are having a bigger issue here. He does have his appointment next Thursday with vascular. Patient has also been referred to [facility] for further evaluation and treatment of the calcinosis for possible sulfate injections. 03/04/2022: The culture that was performed last week grew out multiple species including Klebsiella, Morganella, and Pseudomonas aeruginosa. He had been prescribed Bactrim but this was not adequate to cover all of the species and levofloxacin was recommended. He completed the Bactrim but did not pick up the levofloxacin and begin taking it until yesterday. 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 He had arterial studies performed today. He does have evidence of peripheral arterial disease. Results copied  here: +-------+-----------+-----------+------------+------------+ ABI/TBIT oday's ABIT oday's TBIPrevious ABIPrevious TBI +-------+-----------+-----------+------------+------------+ Right [place] 0.75    +-------+-----------+-----------+------------+------------+ Left [place] 0.57    +-------+-----------+-----------+------------+------------+ Arterial wall calcification precludes accurate ankle pressures and ABIs. Summary: Right: Resting right ankle-brachial index indicates noncompressible right lower extremity arteries. The right toe-brachial index is normal. Left: Resting left ankle-brachial index indicates noncompressible left lower extremity arteries. The left toe-brachial index is abnormal. He continues to have further tissue breakdown at the right medial calf and there is ample slough accumulation along with necrotic subcutaneous tissue. The left dorsal foot wound has built up slough, as well. The right medial foot wound is nearly closed with just a light layer of eschar over the surface. The right dorsal lateral foot wound has also accumulated a fair amount of slough and remains quite tender. 03/10/2022: He has been taking the prescribed levofloxacin and has about 3 days left of this. The erythema and induration on his right leg has improved. He continues to experience further breakdown of the right medial calf wound. There is extensive slough and debris accumulation there. There is heavy slough on his left dorsal foot wound, but no odor or purulent drainage. The right medial foot wound appears to be closed. The right dorsal lateral foot wound also has a fair amount of slough but it is less tender than at our last visit. He still does not have an appointment with dermatology. 03/22/2022: The right anterior tibial wound has closed. The right lateral foot wound is nearly closed with just a little bit of slough. The left dorsal foot wound is smaller and continues to have some slough  buildup but less so than at prior visits. There is good granulation tissue underlying the slough. Unfortunately the right medial calf wound continues to deteriorate. It has a foul odor today and a lot of necrotic tissue, the surrounding skin is also erythematous and moist. 03/30/2022: The right lateral foot wound continues to contract and just has a little bit of slough and eschar present. The left dorsal foot wound is also smaller with slough overlying good granulation tissue. The right medial calf wound actually looks quite a bit better today; there is no odor and there is much less necrotic tissue although some remains present. We have been using topical gentamicin and he has been taking oral Augmentin. 04/07/2022: The patient saw dermatology at [facility] last week. Unfortunately, it appears that they did not read any of the documentation that was provided.  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 Overall, it was a highly unsatisfactory consultation. In the meantime, however, we were able to identify compounding pharmacy who could create a topical sodium thiosulfate ointment. The patient has that with him today. Both of the right foot wounds are now closed. The left dorsal foot wound has contracted but still has a layer of slough on the surface. The medial calf wounds on the right all look a little bit better. They still have heavy slough along with the chunks of calcium. Everything is stained purple. 04/14/2022: The wound on his dorsal left foot is a little bit smaller again today. There is still slough accumulation on the surface. The medial calf wounds have quite a bit less slough accumulation today. His right leg, however, is warm  and erythematous, concerning for cellulitis. 04/14/2022: He completed his course of Augmentin yesterday. The medial calf wound sites are much cleaner today and shallower. There is less fragmented calcium in the wounds. He has a lot of lymphedema fluid-like drainage from these wounds, but no purulent discharge or malodor. The wound on his dorsal left foot has also contracted and is shallower. There is a layer of slough over healthy granulation tissue. 05/05/2022: The left dorsal foot wound is smaller today with less slough and more granulation tissue. The right medial calf wounds are shallower, but have extensive slough and some fat necrosis. The drainage has diminished considerably, however. 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 05/12/2022: The left dorsal foot wound continues to contract and fill with granulation tissue. There is slough on the wound surface. The right medial calf wounds also continue to fill with healthy tissue, but there is remaining slough and fat necrosis present. He had a significant increase in his drainage this week and it was a bright yellow-green color. He also had a new wound open over his right anterior tibial surface associated with the spike of cutaneous calcium. The leg is more red, as well, concerning for infection. 05/19/2022: His culture returned positive for Pseudomonas. He is currently taking a course of oral levofloxacin. We have also been using topical gentamicin mixed in with his sodium thiosulfate. All of the wounds look better this week. 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 There is slough and nonviable subcutaneous tissue still present. The new anterior tibial wound just has a light layer of slough. The dorsal foot wound also has some slough present and appears a little dry today. 05/26/2022: The right  medial leg wound continues to improve. It is feeling with granulation tissue, but still accumulates a fairly thick layer of slough on the surface. The more recent anterior tibial wound has remained quite small, but also has some slough on the surface. The left dorsal foot wound has contracted somewhat and has perimeter epithelialization. He completed his oral levofloxacin. 06/02/2022: The left dorsal foot wound continues to improve significantly. There is just a little slough on the wound surface. The right medial leg wound had black drainage, but it looks like silver alginate was applied last week and I theorized that silver sulfide was created with a combination of the silver alginate  and the sodium thiosulfate. It did, however, have a bit of an odor to it and extensive slough accumulation. The small anterior tibial wound also had a bit of slough and nonviable subcutaneous tissue present. The skin of his leg was rather macerated, as well. 06/09/2022: The left dorsal foot wound is smaller again this week with just a light layer of slough on the surface. The right medial leg wound looks substantially better. The leg and periwound skin are no longer red and macerated. There is good granulation tissue forming with less slough and nonviable tissue. The anterior [name], [name] ([id number]) [id number] Page 10 of 16 tibial wound just has a small amount of slough accumulation. 06/16/2022: The left dorsal foot wound continues to contract. His wrap slipped a little bit, however, and his foot is more swollen. The right medial leg wound continues to improve. The underlying tissue is more vital-appearing and there is less slough. He does have substantial drainage. The small anterior tibial wound has a bit of slough accumulation. 06/23/2022: The left dorsal foot wound is about half the size as it was last week. There is just some light slough on the surface and some eschar buildup  around the edges. The right anterior leg wound is small with a little slough on the surface. Unfortunately, the right medial leg wound deteriorated fairly substantially. It is malodorous with gray/green/black discoloration. 06/30/2022: The left dorsal foot wound continues to contract. It is nearly closed with just a light layer of slough present. The right anterior leg wound is about the same size and has a small amount of slough on the surface. The right medial leg wound looks a lot better than it did last week. The odor has abated and the tissue is less pulpy and necrotic-looking. There is still a fairly thick layer of slough present. 12/13; the left dorsal foot wound looks very healthy. There was no need to change the dressing here and no debridement. He has a small area on the right anterior lower leg apparently had not changed much in size. The area on the medial leg is the most problematic area this is large with some depth and a completely nonviable surface today. 07/14/2022: The left dorsal foot wound is smaller today with just a little slough and eschar present. The right anterior lower leg wound is about the same without any significant change. The medial leg wound has a black and green surface and is malodorous. No purulent drainage and the periwound skin is intact. Edema control is suboptimal. 07/21/2022: The dorsal foot wound continues to contract and is nearly closed with just a little bit of slough and eschar on the surface. The right anterior lower leg wound is shallower today but otherwise the dimensions are unchanged. The medial leg wound looks significantly better although it still has a nonviable surface; the odor and black/green surface has resolved. His culture came back with multiple species sensitive to Augmentin, which was prescribed and a very low level of Pseudomonas, which should be handled by the topical gentamicin we have been using. 07/28/2022: The dorsal foot wound is  down to just a very tiny opening with a little eschar on the surface. The right anterior tibial wound is about the same with some slough buildup. The right medial leg wound looks quite a bit better this week. There is thick rubbery slough on the surface, but the odor and drainage has improved significantly. 08/04/2022: The dorsal foot wound is almost healed, but the periwound skin has broken  down. This appears to be secondary to moisture and adhesive from the absorbent pad. The right anterior tibial wound is shallower. The right medial leg wound continues to improve. It is smaller, still with rubbery slough on the surface. No malodor or purulent drainage. 08/11/2022: The dorsal foot wound is about the same size. It is a little bit dry with some slough accumulation. The right anterior tibial wound is also unchanged. The right medial leg wound is filling in further with good granulation tissue. Still with some slough buildup on the surface. 08/18/2022: His left leg is bright red, hot, and swollen with cellulitis. The dorsal foot wound actually looks quite good and is superficial with just a little bit of slough accumulation. The right anterior tibial wound is unchanged. The right medial leg wound has filled in with good granulation tissue but has copious amounts of drainage. 08/25/2022: His cellulitis has responded nicely to the oral antibiotics. His dorsal foot wound is smaller but has a fair amount of slough buildup. The right anterior tibial wound remains stable and unchanged. The right medial leg wound has more granulation tissue under a layer of slough, but continues to have copious drainage. The drainage is actually causing skin irritation and threatens to result in breakdown. 09/01/2022: He continues to have profuse drainage from his wounds which is resulting in tissue maceration on both the dorsum of his left foot and the periwound distal to his medial leg wound. The dorsal foot wound is nearly  healed, despite this. The medial right leg wound is filling in with good granulation tissue. We are working on getting home health assistance to change his dressings more frequently to try and control the drainage better. 09/08/2022: We have had success in controlling his drainage. The tissue maceration has improved significantly and his swelling has come down quite markedly. The medial right leg wound is much more superficial and has substantially less nonviable tissue present. The anterior tibial wound is a little bit larger, however with some nonviable tissue present. 09/15/2022: The dorsal foot wound is nearly closed. The anterior tibial wound measures slightly larger today. The medial right leg wound continues to improve quite dramatically with less nonviable tissue and more robust-looking granulation. Edema control is significantly better. 09/22/2022: The left dorsal foot wound remains open, albeit quite tiny. The anterior tibial wound is a little bit larger again today. The medial right leg wound continues to improve with an increase in robust and healthy granulation tissue and less accumulation of nonviable tissue. 09/29/2022: His home health providers wrapped his left leg extremely tightly and he ended up having to cut the wrap off of his foot. This resulted in his foot being very swollen and that the dorsal foot wound is a little bit larger today. The anterior tibial wound is about the same size with a little slough on the surface. The medial right leg wound continues to fill in. There are very few areas with any significant depth. There is still slough accumulation. 3/13; Patient has a left dorsal foot wound along with a right anterior tibial wound and right medial wound. We are using silver alginate to the left dorsal foot wound, Santyl and sodium thiosulfate compound ointment to the right lower extremity wounds under 4 layer compression. Patient has no issues or complaints today. 10/13/2022:  The left dorsal foot wound is a little bit bigger today, but his foot is also a little bit more swollen. The right medial leg wound has filled in more and has substantially  less depth. There is still some nonviable tissue present. The small anterior lower leg wound looks about the same. Patient History Information obtained from Patient, Chart. Family History Cancer - Mother, Heart Disease - Father,Mother, Hypertension - Father,Siblings, Lung Disease - Siblings, No family history of Diabetes, Hereditary Spherocytosis, Kidney Disease, Seizures, Stroke, Thyroid Problems, Tuberculosis. Social History Former smoker - quit 1991, Marital Status - Married, Alcohol Use - Moderate, Drug Use - No History, Caffeine Use - Daily - coffee. Medical History Eyes Patient has history of Cataracts - right eye removed Denies history of Glaucoma, Optic Neuritis Ear/Nose/Mouth/Throat Denies history of Chronic sinus problems/congestion, Middle ear problems Hematologic/Lymphatic Patient has history of Anemia - macrocytic, Lymphedema Cardiovascular Patient has history of Congestive Heart Failure, Deep Vein Thrombosis - left leg, Hypertension, Peripheral Venous Disease Phang, Ryken ([id number].pdf Page 11 of 16 Endocrine Denies history of Type I Diabetes, Type II Diabetes Genitourinary Denies history of End Stage Renal Disease Integumentary (Skin) Denies history of History of Burn Oncologic Denies history of Received Chemotherapy, Received Radiation Psychiatric Denies history of Anorexia/bulimia, Confinement Anxiety Hospitalization/Surgery History - cervical fusion. - lumbar surgery. - coronary stent placement. - lasik eye surgery. - hydrocele excision. Medical A Surgical History Notes nd Constitutional Symptoms (General Health) obesity Ear/Nose/Mouth/Throat hard of hearing Respiratory frozen diaphragm right Cardiovascular hyperlipidemia Musculoskeletal myositis,  lumbar DDD, spinal stenosis, cervical facet joint syndrome Objective Constitutional Slightly hypertensive. no acute distress. Vitals Time Taken: 1:23 AM, Height: 71 in, Weight: 267 lbs, BMI: 37.2, Temperature: 98.6 F, Pulse: 74 bpm, Respiratory Rate: 20 breaths/min, Blood Pressure: 149/54 mmHg. Respiratory Normal work of breathing on room air. General Notes: 10/13/2022: The left dorsal foot wound is a little bit bigger today, but his foot is also a little bit more swollen. The right medial leg wound has filled in more and has substantially less depth. There is still some nonviable tissue present. The small anterior lower leg wound looks about the same. Integumentary (Hair, Skin) Wound #1 status is Open. Original cause of wound was Gradually Appeared. The date acquired was: 12/07/2021. The wound has been in treatment 35 weeks. The wound is located on the Left,Dorsal Foot. The wound measures 0.8cm length x 1cm width x 0.1cm depth; 0.628cm^2 area and 0.063cm^3 volume. There is Fat Layer (Subcutaneous Tissue) exposed. There is no tunneling or undermining noted. There is a medium amount of serous drainage noted. The wound margin is distinct with the outline attached to the wound base. There is large (67-100%) red granulation within the wound bed. There is a small (1-33%) amount of necrotic tissue within the wound bed including Adherent Slough. The periwound skin appearance had no abnormalities noted for texture. The periwound skin appearance exhibited: Dry/Scaly, Hemosiderin Staining. The periwound skin appearance did not exhibit: Maceration, Rubor, Erythema. Periwound temperature was noted as No Abnormality. The periwound has tenderness on palpation. Wound #3 status is Open. Original cause of wound was Gradually Appeared. The date acquired was: 12/07/2021. The wound has been in treatment 35 weeks. The wound is located on the Right,Medial Lower Leg. The wound measures 5.5cm length x 5cm width x 0.5cm  depth; 21.598cm^2 area and 10.799cm^3 volume. There is Fat Layer (Subcutaneous Tissue) exposed. There is no tunneling or undermining noted. There is a medium amount of serosanguineous drainage noted. The wound margin is distinct with the outline attached to the wound base. There is medium (34-66%) red granulation within the wound bed. There is a medium (34-66%) amount of necrotic tissue within the wound bed including  Adherent Slough. The periwound skin appearance had no abnormalities noted for texture. The periwound skin appearance exhibited: Hemosiderin Staining. The periwound skin appearance did not exhibit: Dry/Scaly, Maceration, Rubor, Erythema. Periwound temperature was noted as No Abnormality. The periwound has tenderness on palpation. Wound #6 status is Open. Original cause of wound was Gradually Appeared. The date acquired was: 05/12/2022. The wound has been in treatment 22 weeks. The wound is located on the Right,Anterior Lower Leg. The wound measures 0.4cm length x 0.4cm width x 0.3cm depth; 0.126cm^2 area and 0.038cm^3 volume. There is Fat Layer (Subcutaneous Tissue) exposed. There is no tunneling or undermining noted. There is a medium amount of serosanguineous drainage noted. The wound margin is epibole. There is small (1-33%) red granulation within the wound bed. There is a small (1-33%) amount of necrotic tissue within the wound bed including Adherent Slough. The periwound skin appearance had no abnormalities noted for texture. The periwound skin appearance had no abnormalities noted for moisture. The periwound skin appearance exhibited: Hemosiderin Staining. The periwound skin appearance did not exhibit: Atrophie Blanche, Cyanosis, Ecchymosis, Mottled, Pallor, Rubor, Erythema. Periwound temperature was noted as No Abnormality. The periwound has tenderness on palpation. 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 There was a Selective/Open Wound Skin/Epidermis Debridement with a total area of 0.8 sq cm performed by Fredirick Maudlin, MD. With the following instrument(s): Curette to remove Non-Viable tissue/material. Material removed includes Eschar and Skin: Epidermis and. No specimens were taken. A time out was conducted at 14:00, prior to the start of the procedure. A Minimum amount of bleeding was controlled with Pressure. The procedure was tolerated well with a pain level of 0 throughout and a pain level of 0 following the procedure. Post Debridement Measurements: 0.8cm length x 1cm width x 0.1cm depth; 0.063cm^3 volume. Character of Wound/Ulcer Post Debridement is improved. Post procedure Diagnosis Wound #1: Same as Pre-Procedure General Notes: scribed by Baruch Gouty, RN for Dr. [name]. Pre-procedure diagnosis of Wound #1 is a Lymphedema located on the Left,Dorsal Foot . There was a Double Layer Compression Therapy Procedure by Baruch Gouty, RN. Post procedure Diagnosis Wound #1: Same as Pre-Procedure Wound #3 Pre-procedure diagnosis of Wound #3 is a Lymphedema located on the Right,Medial Lower Leg . There was a Excisional Skin/Subcutaneous Tissue Debridement with a total area of 27.5 sq cm performed by Fredirick Maudlin, MD. With the following instrument(s): Curette to remove Viable and Non-Viable tissue/material. Material removed includes Subcutaneous Tissue and  Slough and after achieving pain control using Lidocaine 4% T opical Solution. No specimens were taken. A time out was conducted at 14:00, prior to the start of the procedure. A Minimum amount of bleeding was controlled with Pressure. The procedure was tolerated well with a pain level of 0 throughout and a pain level of 0 following the procedure. Post Debridement Measurements: 5.5cm length x 5cm width x 0.5cm depth; 10.799cm^3 volume. Character of Wound/Ulcer Post Debridement is improved. Post procedure Diagnosis Wound #3: Same as Pre-Procedure General Notes: scribed for Dr. [name] by Baruch Gouty, RN.  Pre-procedure diagnosis of Wound #3 is a Lymphedema located on the Right,Medial Lower Leg . There was a Double Layer Compression Therapy Procedure by Baruch Gouty, RN. 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 There was a Selective/Open Wound Non-Viable Tissue Debridement with a total area of 0.16 sq cm performed by Fredirick Maudlin, MD. With the following instrument(s): Curette to remove Non-Viable tissue/material. Material removed includes [facility] after achieving pain control using Lidocaine 4% Topical Solution. No specimens were taken. A time out was conducted at 14:00, prior to the start of the procedure. A Minimum amount of bleeding was controlled with Pressure. The procedure was tolerated well with a pain level of 0 throughout and a pain level of 0 following the procedure. Post Debridement Measurements: 0.4cm length x 0.4cm width x 0.3cm depth; 0.038cm^3 volume. Character of Wound/Ulcer Post Debridement is improved. Post procedure Diagnosis Wound #6: Same as Pre-Procedure General Notes: scribed for Dr. [name] by Baruch Gouty, RN. Plan Follow-up Appointments: Return Appointment in 1 week. - Dr. [name] Rm 1 Wednesday 3/27 @ 12:30 pm Anesthetic: Wound #1 Left,Dorsal  Foot: (In clinic) Topical Lidocaine 4% applied to wound bed Wound #3 Right,Medial Lower Leg: (In clinic) Topical Lidocaine 4% applied to wound bed Wound #6 Right,Anterior Lower Leg: (In clinic) Topical Lidocaine 4% applied to wound bed Bathing/ Shower/ Hygiene: May shower with protection but do not get wound dressing(s) wet. Protect dressing(s) with water repellant cover (for example, large plastic bag) or a cast cover and may then take shower. Edema Control - Lymphedema / SCD / Other: Lymphedema Pumps. Use Lymphedema pumps on leg(s) 2-3 times a day for 45-60 minutes. If wearing any wraps or hose, do not remove them. Continue exercising as instructed. - use 1-2 times per day over wraps Elevate legs to the level of the heart or above for 30 minutes daily and/or when sitting for 3-4 times a day throughout the day. - throughout the day Avoid standing for long periods of time. Exercise regularly Home Health: New wound care orders this week; continue Home Health for wound care. May utilize formulary equivalent dressing for wound treatment orders unless otherwise specified. - Change right leg dressing only!! Dressing changes to be completed by [place] on Monday / Wednesday / Friday except when patient has scheduled visit at [facility]. - pt seen in clinic on Wednesday Other [place] Orders/Instructions: - [facility] WOUND #1: - Foot Wound Laterality: Dorsal, Left Peri-Wound Care: Zinc Oxide Ointment 30g tube 1 x Per Week/30 Days Discharge Instructions: Apply Zinc Oxide to periwound with each dressing change as needed for excoriation Peri-Wound Care: Sween Lotion (Moisturizing lotion) 1 x Per Week/30 Days Discharge Instructions: Apply moisturizing lotion as directed Prim Dressing: Sorbalgon AG Dressing, 4x4 (in/in) (Generic) 1 x Per Week/30 Days ary Discharge Instructions: or equivalent silver alginate. Apply to wound bed as instructed [name], [name] ([id number])  [id number] Page 13 of 16 Secondary Dressing: ABD Pad, 5x9 (Generic) 1 x Per Week/30 Days Discharge Instructions: Apply over primary dressing as directed. Secondary Dressing: Woven Gauze Sponge, Non-Sterile 4x4 in (Generic) 1 x Per Week/30 Days Discharge Instructions: Apply over primary dressing as directed. Com pression Wrap: FourPress (4 layer compression wrap) (Generic) 1 x Per Week/30 Days Discharge Instructions: Apply four layer compression as directed. May also use Urgo K2 compression system as alternative. WOUND #3: - Lower Leg Wound Laterality: Right, Medial Peri-Wound Care: Triamcinolone 15 (  g) 3 x Per Week/30 Days Discharge Instructions: Use triamcinolone 15 (g) as directed Peri-Wound Care: Zinc Oxide Ointment 30g tube 3 x Per Week/30 Days Discharge Instructions: Apply Zinc Oxide to excoriated periwound with each dressing change as needed Peri-Wound Care: Sween Lotion (Moisturizing lotion) 3 x Per Week/30 Days Discharge Instructions: Apply moisturizing lotion as directed Prim Dressing: Santyl Ointment 3 x Per Week/30 Days ary Discharge Instructions: to wound bed mixed 1:1 with sodium thiosulfate ointment Prim Dressing: sodium thiosulfate ointment 3 x Per Week/30 Days ary Discharge Instructions: 1:1 mix with santyl to wound bed Secondary Dressing: ABD Pad, 8x10 (Generic) 3 x Per Week/30 Days Discharge Instructions: Apply over primary dressing as directed. Secondary Dressing: Woven Gauze Sponge, Non-Sterile 4x4 in (Generic) 3 x Per Week/30 Days Discharge Instructions: Apply over primary dressing as directed. Com pression Wrap: FourPress (4 layer compression wrap) (Generic) 3 x Per Week/30 Days Discharge Instructions: Apply four layer compression as directed. May also use Urgo K2 compression system as alternative. WOUND #6: - Lower Leg Wound Laterality: Right, Anterior Peri-Wound Care: Triamcinolone 15 (g) 3 x Per Week/30 Days Discharge Instructions: Use  triamcinolone 15 (g) ( in clinic) Peri-Wound Care: Zinc Oxide Ointment 30g tube 3 x Per Week/30 Days Discharge Instructions: Apply Zinc Oxide to periwound with each dressing change as needed for excoriation Peri-Wound Care: Sween Lotion (Moisturizing lotion) 3 x Per Week/30 Days Discharge Instructions: Apply moisturizing lotion as directed Prim Dressing: sodium thiosulfate ointment 3 x Per Week/30 Days ary Discharge Instructions: to wound bed with each dressing change Secondary Dressing: ABD Pad, 5x9 (Generic) 3 x Per Week/30 Days Discharge Instructions: Apply over primary dressing as directed. Secondary Dressing: Woven Gauze Sponge, Non-Sterile 4x4 in (Generic) 3 x Per Week/30 Days Discharge Instructions: Apply over primary dressing as directed. Com pression Wrap: FourPress (4 layer compression wrap) (Generic) 3 x Per Week/30 Days Discharge Instructions: Apply four layer compression as directed. May also use Urgo K2 compression system as alternative. 10/13/2022: The left dorsal foot wound is a little bit bigger today, but his foot is also a little bit more swollen. The right medial leg wound has filled in more and has substantially less depth. There is still some nonviable tissue present. The small anterior lower leg wound looks about the same. I used a curette to debride some eschar of the left dorsal foot and slough off of the right anterior tibial wound. I debrided slough and subcutaneous tissue off of the right medial leg wound. We will continue compounded sodium thiosulfate to the right leg wounds and silver alginate to the left foot wound. Continue bilateral 4-layer compression. Follow-up in 1 week. Electronic Signature(s) Signed: 10/13/2022 2:28:12 PM By: Fredirick Maudlin MD FACS Entered By: Fredirick Maudlin on 10/13/2022 14:28:12 -------------------------------------------------------------------------------- [name] Details Patient Name: Date of Service: YASUO, PAFFORD 10/13/2022 1:15  PM Medical Record Number: [id number] Patient Account Number: [id number] Date of Birth/Sex: Treating RN: 04/27/1951 (72 y.o. M) Primary Care Provider: PA [name], NO Other Clinician: Referring Provider: Treating Provider/Extender: [name] in Treatment: 35 Information Obtained From Patient Chart Constitutional Symptoms (General Health) Medical History: Past Medical History Notes: obesity Eyes Medical History: Positive for: Cataracts - right eye removed Negative for: Glaucoma; Optic Neuritis Ear/Nose/Mouth/Throat TOMASZ, FORNOFF ([id number]) [id number] Page 14 of 16 Medical History: Negative for: Chronic sinus problems/congestion; Middle ear problems Past Medical History Notes: hard of hearing Hematologic/Lymphatic Medical History: Positive for: Anemia - macrocytic; Lymphedema Respiratory Medical History: Past Medical History Notes: frozen diaphragm right Cardiovascular Medical  History: Positive for: Congestive Heart Failure; Deep Vein Thrombosis - left leg; Hypertension; Peripheral Venous Disease Past Medical History Notes: hyperlipidemia Endocrine Medical History: Negative for: Type I Diabetes; Type II Diabetes Genitourinary Medical History: Negative for: End Stage Renal Disease Integumentary (Skin) Medical History: Negative for: History of Burn Musculoskeletal Medical History: Past Medical History Notes: myositis, lumbar DDD, spinal stenosis, cervical facet joint syndrome Oncologic Medical History: Negative for: Received Chemotherapy; Received Radiation Psychiatric Medical History: Negative for: Anorexia/bulimia; Confinement Anxiety HBO Extended History Items Eyes: Cataracts Immunizations Pneumococcal Vaccine: Received Pneumococcal Vaccination: Yes Received Pneumococcal Vaccination On or After 60th Birthday: Yes Implantable Devices No devices added Hospitalization / Surgery History Type of  Hospitalization/Surgery cervical fusion lumbar surgery coronary stent placement lasik eye surgery hydrocele excision Family and Social History Cancer: Yes - Mother; Diabetes: No; Heart Disease: Yes - Father,Mother; Hereditary Spherocytosis: No; Hypertension: Yes Emileo, Schadler ([id number]) [id number] Page 15 of 16 Disease: No; Lung Disease: Yes - Siblings; Seizures: No; Stroke: No; Thyroid Problems: No; Tuberculosis: No; Former smoker - quit 1991; Marital Status - Married; Alcohol Use: Moderate; Drug Use: No History; Caffeine Use: Daily - coffee; Financial Concerns: No; Food, Clothing or Shelter Needs: No; Support System Lacking: No; Transportation Concerns: No Electronic Signature(s) Signed: 10/13/2022 3:48:41 PM By: Fredirick Maudlin MD FACS Entered By: Fredirick Maudlin on 10/13/2022 14:26:37 -------------------------------------------------------------------------------- SuperBill Details Patient Name: Date of Service: NATHENIAL, [place] 10/13/2022 Medical Record Number: [id number] Patient Account Number: [id number] Date of Birth/Sex: Treating RN: Feb 13, 1951 (72 y.o. M) Primary Care Provider: PA [name], NO Other Clinician: Referring Provider: Treating Provider/Extender: [name] in Treatment: 35 Diagnosis Coding ICD-10 Codes Code Description [phone] Non-pressure chronic ulcer of other part of right lower leg with fat layer exposed L97.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) I10 Essential (primary) hypertension I25.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 angina pectoris I89.0 Lymphedema, not elsewhere classified Z86.718 Personal history of other venous thrombosis and embolism Facility Procedures : CPT4 Code: [id number] Description: 11042 - DEB SUBQ TISSUE 20 SQ CM/<  ICD-10 Diagnosis Description 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 Modifier: ed Quantity: 1 : CPT4 Code: [id number] Description: 11045 - DEB SUBQ TISS EA ADDL 20CM ICD-10 Diagnosis Description 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 Modifier: ed Quantity: 1 : CPT4 Code: [id number] Description: [id number] - DEBRIDE WOUND 1ST 20 SQ CM OR < ICD-10 Diagnosis Description L97.522 Non-pressure chronic ulcer of other part of left foot with fat layer exposed Modifier: Quantity: 1 Physician Procedures : CPT4 Code Description Modifier [id number] - WC PHYS LEVEL 4 - EST PT 25 ICD-10 Diagnosis Description 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ed L97.522 Non-pressure chronic ulcer of other part of left  foot with fat layer exposed 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) Quantity: 1 : [id number] 11042 - WC PHYS SUBQ TISS 20 SQ CM ICD-10 Diagnosis Description 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 Quantity: 1 : [id number] 11045 - WC PHYS SUBQ TISS EA ADDL 20 CM ICD-10 Diagnosis Description 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 KAYDN, SHIM ([id number]) [phone] Quantity:  1 7.pdf Page 16 of 16 : [id number] - WC PHYS DEBR WO ANESTH 20 SQ CM 1 ICD-10 Diagnosis Description L97.522 Non-pressure chronic ulcer of other part of left foot with fat layer exposed Quantity: Electronic Signature(s) Signed: 10/13/2022 2:29:52 PM By: Fredirick Maudlin MD FACS Entered By: Fredirick Maudlin on 10/13/2022 14:29:52

## 2022-10-15 DIAGNOSIS — I10 Essential (primary) hypertension: Secondary | ICD-10-CM | POA: Diagnosis not present

## 2022-10-15 DIAGNOSIS — L03116 Cellulitis of left lower limb: Secondary | ICD-10-CM | POA: Diagnosis not present

## 2022-10-15 DIAGNOSIS — L942 Calcinosis cutis: Secondary | ICD-10-CM | POA: Diagnosis not present

## 2022-10-15 DIAGNOSIS — L97522 Non-pressure chronic ulcer of other part of left foot with fat layer exposed: Secondary | ICD-10-CM | POA: Diagnosis not present

## 2022-10-15 DIAGNOSIS — I872 Venous insufficiency (chronic) (peripheral): Secondary | ICD-10-CM | POA: Diagnosis not present

## 2022-10-15 DIAGNOSIS — L97812 Non-pressure chronic ulcer of other part of right lower leg with fat layer exposed: Secondary | ICD-10-CM | POA: Diagnosis not present

## 2022-10-18 DIAGNOSIS — L942 Calcinosis cutis: Secondary | ICD-10-CM | POA: Diagnosis not present

## 2022-10-18 DIAGNOSIS — L97812 Non-pressure chronic ulcer of other part of right lower leg with fat layer exposed: Secondary | ICD-10-CM | POA: Diagnosis not present

## 2022-10-18 DIAGNOSIS — L97522 Non-pressure chronic ulcer of other part of left foot with fat layer exposed: Secondary | ICD-10-CM | POA: Diagnosis not present

## 2022-10-18 DIAGNOSIS — I10 Essential (primary) hypertension: Secondary | ICD-10-CM | POA: Diagnosis not present

## 2022-10-18 DIAGNOSIS — I872 Venous insufficiency (chronic) (peripheral): Secondary | ICD-10-CM | POA: Diagnosis not present

## 2022-10-18 DIAGNOSIS — L03116 Cellulitis of left lower limb: Secondary | ICD-10-CM | POA: Diagnosis not present

## 2022-10-20 ENCOUNTER — Encounter (HOSPITAL_BASED_OUTPATIENT_CLINIC_OR_DEPARTMENT_OTHER): Payer: Medicare Other | Admitting: General Surgery

## 2022-10-20 DIAGNOSIS — I890 Lymphedema, not elsewhere classified: Secondary | ICD-10-CM | POA: Diagnosis not present

## 2022-10-20 DIAGNOSIS — I872 Venous insufficiency (chronic) (peripheral): Secondary | ICD-10-CM | POA: Diagnosis not present

## 2022-10-20 DIAGNOSIS — L942 Calcinosis cutis: Secondary | ICD-10-CM | POA: Diagnosis not present

## 2022-10-20 DIAGNOSIS — L97522 Non-pressure chronic ulcer of other part of left foot with fat layer exposed: Secondary | ICD-10-CM | POA: Diagnosis not present

## 2022-10-20 DIAGNOSIS — L97812 Non-pressure chronic ulcer of other part of right lower leg with fat layer exposed: Secondary | ICD-10-CM | POA: Diagnosis not present

## 2022-10-20 DIAGNOSIS — I2510 Atherosclerotic heart disease of native coronary artery without angina pectoris: Secondary | ICD-10-CM | POA: Diagnosis not present

## 2022-10-21 NOTE — Progress Notes (Signed)
RIDLEY, PELOSI ([id number]) [id number] Page 1 of 11 Visit Report for 10/20/2022 Arrival Information Details Patient Name: Date of Service: [name], [name] 10/20/2022 12:30 PM Medical Record Number: [id number] Patient Account Number: [id number] Date of Birth/Sex: Treating RN: Apr 02, 1951 (71 y.o. [name] Primary Care [name]: PA [name], [place] Other Clinician: Referring [name]: Treating [name]/Extender: [name] in Treatment: 36 Visit Information History Since Last Visit Added or deleted any medications: No Patient Arrived: Walker Any new allergies or adverse reactions: No Arrival Time: 12:36 Had a fall or experienced change in No Accompanied By: self activities of daily living that may affect Transfer Assistance: None risk of falls: Patient Identification Verified: Yes Signs or symptoms of abuse/neglect since last visito No Secondary Verification Process Completed: Yes Hospitalized since last visit: No Patient Requires Transmission-Based Precautions: No Implantable device outside of the clinic excluding No Patient Has Alerts: Yes cellular tissue based products placed in the center Patient Alerts: R ABI= N/C, TBI= .75 since last visit: L ABI =N/C, TBI = .57 Has Dressing in Place as Prescribed: Yes Has Compression in Place as Prescribed: Yes Pain Present Now: No Electronic Signature(s) Signed: 10/20/2022 5:17:40 PM By: [name] Gouty RN, BSN Entered By: [name] on 10/20/2022 12:37:09 -------------------------------------------------------------------------------- Compression Therapy Details Patient Name: Date of Service: [name], [name] 10/20/2022 12:30 PM Medical Record Number: [id number] Patient Account Number: [id number] Date of Birth/Sex: Treating RN: 03-Nov-1950 (72 y.o. [name] Primary Care [name]: PA [name], [place] Other Clinician: Referring [name]: Treating [name]/Extender: [name] in Treatment: 36 Compression Therapy Performed for Wound Assessment: Wound #1 Left,Dorsal Foot Performed By: Clinician [name] Gouty, RN Compression Type: Double Layer Post Procedure Diagnosis Same as Pre-procedure Electronic Signature(s) Signed: 10/20/2022 5:17:40 PM By: [name] Gouty RN, BSN Entered By: [name] on 10/20/2022 13:03:02 -------------------------------------------------------------------------------- Compression Therapy Details Patient Name: Date of Service: [name], [name] 10/20/2022 12:30 PM Medical Record Number: [id number] Patient Account Number: [id number] Date of Birth/Sex: Treating RN: Jun 22, 1951 (72 y.o. [name] Primary Care Charli Halle: PA [name], [place] Other Clinician: Referring [name]: Treating [name]/Extender: [name] in Treatment: 36 Compression Therapy Performed for Wound Assessment: Wound #3 Right,Medial Lower Leg Performed By: Clinician [name] Gouty, RN Compression Type: Double Layer Post Procedure Diagnosis Same as [name] ([id number]) [id number] Page 2 of 11 Electronic Signature(s) Signed: 10/20/2022 5:17:40 PM By: [name] Gouty RN, BSN Entered By: [name] on 10/20/2022 13:03:02 -------------------------------------------------------------------------------- Encounter Discharge Information Details Patient Name: Date of Service: [name], [name] 10/20/2022 12:30 PM Medical Record Number: [id number] Patient Account Number: [id number] Date of Birth/Sex: Treating RN: 1950-09-01 (72 y.o. [name] Primary Care [name]: PA [name], [place] Other Clinician: Referring Rosemae Mcquown: Treating [name]/Extender: [name] in Treatment: 36 Encounter Discharge Information Items Post Procedure Vitals Discharge Condition: Stable Temperature (F): 98.3 Ambulatory Status: Walker Pulse (bpm): 74 Discharge  Destination: Home Respiratory Rate (breaths/min): 18 Transportation: Private Auto Blood Pressure (mmHg): 179/77 Accompanied By: self Schedule Follow-up Appointment: Yes Clinical Summary of Care: Patient Declined Electronic Signature(s) Signed: 10/20/2022 5:17:40 PM By: [name] Gouty RN, BSN Entered By: [name] on 10/20/2022 13:38:52 -------------------------------------------------------------------------------- Lower Extremity Assessment Details Patient Name: Date of Service: [name], [name] 10/20/2022 12:30 PM Medical Record Number: [id number] Patient Account Number: [id number] Date of Birth/Sex: Treating RN: 20-May-1951 (72 y.o. [name] Primary Care [name]: PA [name], [place] Other Clinician: Referring [name]: Treating [name]/Extender: [name] in Treatment: 36 Edema Assessment Assessed: [Left: No] [  Right: No] Edema: [Left: Yes] [Right: Yes] Calf Left: Right: Point of Measurement: From Medial Instep 40 cm 41.3 cm Ankle Left: Right: Point of Measurement: From Medial Instep 23 cm 25.5 cm Vascular Assessment Pulses: Dorsalis Pedis Palpable: [Left:Yes] [Right:Yes] Electronic Signature(s) Signed: 10/20/2022 5:17:40 PM By: [name] Gouty RN, BSN Entered By: [name] on 10/20/2022 12:42:47 Multi Wound Chart Details -------------------------------------------------------------------------------- [name] ([id number].pdf Page 3 of 11 Patient Name: Date of Service: [name], [name] 10/20/2022 12:30 PM Medical Record Number: [id number] Patient Account Number: [id number] Date of Birth/Sex: Treating RN: 04-14-1951 (72 y.o. [name] Primary Care [name]: PA [name], [place] Other Clinician: Referring [name]: Treating [name]/Extender: [name] in Treatment: 36 Vital Signs Height(in): 71 Pulse(bpm): 92 Weight(lbs): 27 Blood Pressure(mmHg): 179/77 Body Mass  Index(BMI): 37.2 Temperature(F): 98.3 Respiratory Rate(breaths/min): 18 Wound Assessments Wound Number: 1 3 6  Photos: Left, Dorsal Foot Right, Medial Lower Leg Right, Anterior Lower Leg Wound Location: Gradually Appeared Gradually Appeared Gradually Appeared Wounding Event: Lymphedema Lymphedema Calcinosis Primary Etiology: Cataracts, Anemia, Lymphedema, Cataracts, Anemia, Lymphedema, Cataracts, Anemia, Lymphedema, Comorbid History: Congestive Heart Failure, Deep Vein Congestive Heart Failure, Deep Vein Congestive Heart Failure, Deep Vein Thrombosis, Hypertension, Peripheral Thrombosis, Hypertension, Peripheral Thrombosis, Hypertension, Peripheral Venous Disease Venous Disease Venous Disease 12/07/2021 12/07/2021 05/12/2022 Date Acquired: 36 36 23 Weeks of Treatment: Open Open Open Wound Status: No No No Wound Recurrence: 0.4x0.5x0.1 5.5x5.3x0.6 0.8x0.6x0.5 Measurements L x W x D (cm) 0.157 22.894 0.377 A (cm) : rea 0.016 13.737 0.188 Volume (cm) : 95.90% -328.60% -59.70% % Reduction in A rea: 98.90% -543.10% -683.30% % Reduction in Volume: Full Thickness Without Exposed Full Thickness With Exposed Support Full Thickness Without Exposed Classification: Support Structures Structures Support Structures Medium Large Medium Exudate A mount: Serous Serous Serous Exudate Type: Geophysical data processor Exudate Color: Distinct, outline attached Distinct, outline attached Epibole Wound Margin: Large (67-100%) Large (67-100%) None Present (0%) Granulation A mount: Pink, Pale Red N/A Granulation Quality: Small (1-33%) Small (1-33%) Small (1-33%) Necrotic A mount: Fat Layer (Subcutaneous Tissue): Yes Fat Layer (Subcutaneous Tissue): Yes Fat Layer (Subcutaneous Tissue): Yes Exposed Structures: Fascia: No Fascia: No Fascia: No Tendon: No Tendon: No Tendon: No Muscle: No Muscle: No Muscle: No Joint: No Joint: No Joint: No Bone: No Bone: No Bone: No Medium (34-66%)  Small (1-33%) Small (1-33%) Epithelialization: Debridement - Selective/Open Wound Debridement - Excisional Debridement - Excisional Debridement: Pre-procedure Verification/Time Out 13:05 13:05 13:05 Taken: Lidocaine 4% Topical Solution Lidocaine 4% Topical Solution Lidocaine 4% Topical Solution Pain Control: Necrotic/Eschar Subcutaneous, Slough Subcutaneous, Slough Tissue Debrided: Non-Viable Tissue Skin/Subcutaneous Tissue Skin/Subcutaneous Tissue Level: 0.2 15.9 0.48 Debridement A (sq cm): rea Curette Curette Curette Instrument: Minimum Minimum Minimum Bleeding: Pressure Pressure Pressure Hemostasis A chieved: 1 1 1  Procedural Pain: 0 0 0 Post Procedural Pain: Procedure was tolerated well Procedure was tolerated well Procedure was tolerated well Debridement Treatment Response: 0.4x0.5x0.1 5.5x5.3x0.6 0.8x0.6x0.5 Post Debridement Measurements L x W x D (cm) 0.016 13.737 0.188 Post Debridement Volume: (cm) Excoriation: No Excoriation: Yes Excoriation: No Periwound Skin Texture: Induration: No Callus: No Crepitus: No Rash: No Scarring: No Dry/Scaly: Yes Maceration: No Maceration: No Periwound Skin Moisture: Maceration: No Dry/Scaly: No Dry/Scaly: No Hemosiderin Staining: Yes Erythema: Yes Hemosiderin Staining: Yes Periwound Skin ColorRITHWIK, [name] ([id number]) [phone].pdf Page 4 of 11 Erythema: No Hemosiderin Staining: Yes Atrophie Blanche: No Rubor: No Rubor: No Cyanosis: No Ecchymosis: No Erythema: No Mottled: No Pallor: No Rubor: No No Abnormality No Abnormality No Abnormality Temperature: Yes Yes  Yes Tenderness on Palpation: Compression Therapy Compression Therapy Debridement Procedures Performed: Debridement Debridement Treatment Notes Electronic Signature(s) Signed: 10/20/2022 1:28:04 PM By: Fredirick Maudlin MD FACS Signed: 10/20/2022 5:17:40 PM By: [name] Gouty RN, BSN Entered By: Fredirick Maudlin on  10/20/2022 13:28:04 -------------------------------------------------------------------------------- Multi-Disciplinary Care Plan Details Patient Name: Date of Service: [name], [name] 10/20/2022 12:30 PM Medical Record Number: [id number] Patient Account Number: [id number] Date of Birth/Sex: Treating RN: 25-Nov-1950 (72 y.o. [name] Primary Care Petr Bontempo: PA [name], [place] Other Clinician: Referring Trayden Brandy: Treating Jim Lundin/Extender: [name] in Treatment: 36 Multidisciplinary Care Plan reviewed with physician Active Inactive Venous Leg Ulcer Nursing Diagnoses: Knowledge deficit related to disease process and management Potential for venous Insuffiency (use before diagnosis confirmed) Goals: Patient will maintain optimal edema control Date Initiated: 02/17/2022 Target Resolution Date: 10/27/2022 Goal Status: Active Interventions: Assess peripheral edema status every visit. Compression as ordered Provide education on venous insufficiency Treatment Activities: Non-invasive vascular studies : 02/17/2022 Therapeutic compression applied : 02/17/2022 Notes: Wound/Skin Impairment Nursing Diagnoses: Impaired tissue integrity Knowledge deficit related to ulceration/compromised skin integrity Goals: Patient/caregiver will verbalize understanding of skin care regimen Date Initiated: 02/17/2022 Target Resolution Date: 10/27/2022 Goal Status: Active Ulcer/skin breakdown will have a volume reduction of 30% by week 4 Date Initiated: 02/17/2022 Date Inactivated: 03/10/2022 Target Resolution Date: 03/10/2022 Unmet Reason: infection being treated, Goal Status: Unmet waiting on derm appte Ulcer/skin breakdown will have a volume reduction of 50% by week 8 Date Initiated: 03/10/2022 Date Inactivated: 04/14/2022 Target Resolution Date: 04/07/2022 Goal Status: Unmet Unmet Reason: calcinosis Ulcer/skin breakdown will have a volume reduction of 80% by week [place],  [place] ([id number]) [phone].pdf Page 5 of 11 Date Initiated: 04/14/2022 Date Inactivated: 05/05/2022 Target Resolution Date: 05/05/2022 Unmet Reason: infection, chronic Goal Status: Unmet condition Interventions: Assess patient/caregiver ability to obtain necessary supplies Assess patient/caregiver ability to perform ulcer/skin care regimen upon admission and as needed Assess ulceration(s) every visit Provide education on ulcer and skin care Treatment Activities: Skin care regimen initiated : 02/17/2022 Topical wound management initiated : 02/17/2022 Notes: Electronic Signature(s) Signed: 10/20/2022 5:17:40 PM By: [name] Gouty RN, BSN Entered By: [name] on 10/20/2022 12:59:09 -------------------------------------------------------------------------------- Pain Assessment Details Patient Name: Date of Service: [name], [name] 10/20/2022 12:30 PM Medical Record Number: [id number] Patient Account Number: [id number] Date of Birth/Sex: Treating RN: Apr 10, 1951 (72 y.o. [name] Primary Care Millan Legan: PA [name], [place] Other Clinician: Referring Monserrath Junio: Treating Rishi Vicario/Extender: [name] in Treatment: 36 Active Problems Location of Pain Severity and Description of Pain Patient Has Paino No Site Locations Rate the pain. Current Pain Level: 0 Pain Management and Medication Current Pain Management: Electronic Signature(s) Signed: 10/20/2022 5:17:40 PM By: [name] Gouty RN, BSN Entered By: [name] on 10/20/2022 12:37:47 -------------------------------------------------------------------------------- Patient/Caregiver Education Details Patient Name: Date of Service: [name], [name] 3/27/2024andnbsp12:30 PM Medical Record Number: [id number] Patient Account Number: [id number] Date of Birth/Gender: Treating RN: 12-28-1950 (71 y.o. [name] Primary Care Physician: PA [name], [place] Other Clinician: Referring  Physician: Treating Physician/Extender: [name] in Treatment: [address], [place] ([id number]) [id number] Page 6 of 11 Education Assessment Education Provided To: Patient Education Topics Provided Venous: Methods: Explain/Verbal Responses: Reinforcements needed, State content correctly Wound/Skin Impairment: Methods: Explain/Verbal Responses: Reinforcements needed, State content correctly Electronic Signature(s) Signed: 10/20/2022 5:17:40 PM By: [name] Gouty RN, BSN Entered By: [name] on 10/20/2022 12:59:34 -------------------------------------------------------------------------------- Wound Assessment Details Patient Name: Date of Service: [name], [name] 10/20/2022 12:30 PM Medical Record Number: [id number]  Patient Account Number: [id number] Date of Birth/Sex: Treating RN: August 16, 1950 (72 y.o. [name] Primary Care Jaryd Drew: PA [name], [place] Other Clinician: Referring Adriel Kessen: Treating Scottie Metayer/Extender: [name] in Treatment: 36 Wound Status Wound Number: 1 Primary Lymphedema Etiology: Wound Location: Left, Dorsal Foot Wound Open Wounding Event: Gradually Appeared Status: Date Acquired: 12/07/2021 Comorbid Cataracts, Anemia, Lymphedema, Congestive Heart Failure, Deep Weeks Of Treatment: 36 History: Vein Thrombosis, Hypertension, Peripheral Venous Disease Clustered Wound: No Photos Wound Measurements Length: (cm) 0.4 Width: (cm) 0.5 Depth: (cm) 0.1 Area: (cm) 0.157 Volume: (cm) 0.016 % Reduction in Area: 95.9% % Reduction in Volume: 98.9% Epithelialization: Medium (34-66%) Tunneling: No Undermining: No Wound Description Classification: Full Thickness Without Exposed Suppor Wound Margin: Distinct, outline attached Exudate Amount: Medium Exudate Type: Serous Exudate Color: amber t Structures Foul Odor After Cleansing: No Slough/Fibrino Yes Wound Bed Granulation  Amount: Large (67-100%) Exposed Structure Granulation Quality: Pink, Pale Fascia Exposed: No ROEL, SIVERS ([id number] Page 7 of 11 Necrotic Amount: Small (1-33%) Fat Layer (Subcutaneous Tissue) Exposed: Yes Necrotic Quality: Adherent Slough Tendon Exposed: No Muscle Exposed: No Joint Exposed: No Bone Exposed: No Periwound Skin Texture Texture Color No Abnormalities Noted: Yes No Abnormalities Noted: No Erythema: No Moisture Hemosiderin Staining: Yes No Abnormalities Noted: No Rubor: No Dry / Scaly: Yes Maceration: No Temperature / Pain Temperature: No Abnormality Tenderness on Palpation: Yes Treatment Notes Wound #1 (Foot) Wound Laterality: Dorsal, Left Cleanser Peri-Wound Care Zinc Oxide Ointment 30g tube Discharge Instruction: Apply Zinc Oxide to periwound with each dressing change as needed for excoriation Sween Lotion (Moisturizing lotion) Discharge Instruction: Apply moisturizing lotion as directed Topical Primary Dressing Sorbalgon AG Dressing, 4x4 (in/in) Discharge Instruction: or equivalent silver alginate. Apply to wound bed as instructed Secondary Dressing Woven Gauze Sponge, Non-Sterile 4x4 in Discharge Instruction: Apply over primary dressing as directed. Secured With Compression Wrap FourPress (4 layer compression wrap) Discharge Instruction: Apply four layer compression as directed. May also use Urgo K2 compression system as alternative. Compression Stockings Add-Ons Electronic Signature(s) Signed: 10/20/2022 5:17:40 PM By: [name] Gouty RN, BSN Entered By: [name] on 10/20/2022 12:54:43 -------------------------------------------------------------------------------- Wound Assessment Details Patient Name: Date of Service: [name], [name] 10/20/2022 12:30 PM Medical Record Number: [id number] Patient Account Number: [id number] Date of Birth/Sex: Treating RN: 06-16-1951 (72 y.o. [name] Primary Care  Velvie Thomaston: PA [name], [place] Other Clinician: Referring Amil Bouwman: Treating Damyia Strider/Extender: Valeria Batman Weeks in Treatment: 36 Wound Status Wound Number: 3 Primary Lymphedema Etiology: Wound Location: Right, Medial Lower Leg Wound Open Wounding Event: Gradually Appeared Status: Date Acquired: 12/07/2021 Comorbid Cataracts, Anemia, Lymphedema, Congestive Heart Failure, Deep Weeks Of Treatment: 36 History: Vein Thrombosis, Hypertension, Peripheral Venous Disease Clustered Wound: No Photos KYALL, WESBY ([id number]) [id number] Page 8 of 11 Wound Measurements Length: (cm) 5.5 Width: (cm) 5.3 Depth: (cm) 0.6 Area: (cm) 22.894 Volume: (cm) 13.737 % Reduction in Area: -328.6% % Reduction in Volume: -543.1% Epithelialization: Small (1-33%) Tunneling: No Undermining: No Wound Description Classification: Full Thickness With Exposed Suppo Wound Margin: Distinct, outline attached Exudate Amount: Large Exudate Type: Serous Exudate Color: amber rt Structures Foul Odor After Cleansing: No Slough/Fibrino Yes Wound Bed Granulation Amount: Large (67-100%) Exposed Structure Granulation Quality: Red Fascia Exposed: No Necrotic Amount: Small (1-33%) Fat Layer (Subcutaneous Tissue) Exposed: Yes Necrotic Quality: Adherent Slough Tendon Exposed: No Muscle Exposed: No Joint Exposed: No Bone Exposed: No Periwound Skin Texture Texture Color No Abnormalities Noted: No No Abnormalities Noted: No Excoriation: Yes Erythema: Yes Hemosiderin Staining: Yes Moisture Rubor:  No No Abnormalities Noted: No Dry / Scaly: No Temperature / Pain Maceration: No Temperature: No Abnormality Tenderness on Palpation: Yes Treatment Notes Wound #3 (Lower Leg) Wound Laterality: Right, Medial Cleanser Peri-Wound Care Triamcinolone 15 (g) Discharge Instruction: Use triamcinolone 15 (g) as directed Zinc Oxide Ointment 30g tube Discharge Instruction: Apply Zinc  Oxide to excoriated periwound with each dressing change as needed Sween Lotion (Moisturizing lotion) Discharge Instruction: Apply moisturizing lotion as directed Topical Primary Dressing Santyl Ointment Discharge Instruction: to wound bed mixed 1:1 with sodium thiosulfate ointment sodium thiosulfate ointment Discharge Instruction: 1:1 mix with santyl to wound bed Secondary Dressing ABD Pad, 8x10 Discharge Instruction: Apply over primary dressing as directed. Drawtex 4x4 in Discharge Instruction: Apply over primary dressing as directed. ANTONIA, DITTUS ([id number]) [id number] Page 9 of 11 Woven Gauze Sponge, Non-Sterile 4x4 in Discharge Instruction: Apply over primary dressing as directed. Zetuvit Plus 4x8 in Discharge Instruction: Apply over primary dressing as directed. Secured With Compression Wrap FourPress (4 layer compression wrap) Discharge Instruction: Apply four layer compression as directed. May also use Urgo K2 compression system as alternative. Compression Stockings Add-Ons Electronic Signature(s) Signed: 10/20/2022 5:17:40 PM By: [name] Gouty RN, BSN Entered By: [name] on 10/20/2022 12:55:51 -------------------------------------------------------------------------------- Wound Assessment Details Patient Name: Date of Service: KHALIK, HAYLEY 10/20/2022 12:30 PM Medical Record Number: [id number] Patient Account Number: [id number] Date of Birth/Sex: Treating RN: 05/10/1951 (72 y.o. [name] Primary Care Lundy Cozart: PA [name], [place] Other Clinician: Referring November Sypher: Treating Kasir Hallenbeck/Extender: [name] in Treatment: 36 Wound Status Wound Number: 6 Primary Calcinosis Etiology: Wound Location: Right, Anterior Lower Leg Wound Open Wounding Event: Gradually Appeared Status: Date Acquired: 05/12/2022 Comorbid Cataracts, Anemia, Lymphedema, Congestive Heart Failure, Deep Weeks Of Treatment: 23 History:  Vein Thrombosis, Hypertension, Peripheral Venous Disease Clustered Wound: No Photos Wound Measurements Length: (cm) 0.8 Width: (cm) 0.6 Depth: (cm) 0.5 Area: (cm) 0.377 Volume: (cm) 0.188 % Reduction in Area: -59.7% % Reduction in Volume: -683.3% Epithelialization: Small (1-33%) Tunneling: No Undermining: No Wound Description Classification: Full Thickness Without Exposed Support Structures Wound Margin: Epibole Exudate Amount: Medium Exudate Type: Serous Exudate Color: amber Foul Odor After Cleansing: No Slough/Fibrino Yes Wound Bed Granulation Amount: None Present (0%) Exposed Structure Necrotic Amount: Small (1-33%) Fascia Exposed: No Necrotic Quality: Adherent Slough Fat Layer (Subcutaneous Tissue) Exposed: Yes Tendon Exposed: No Muscle Exposed: No [place], [name] ([id number] Page 10 of 11 Joint Exposed: No Bone Exposed: No Periwound Skin Texture Texture Color No Abnormalities Noted: Yes No Abnormalities Noted: No Atrophie Blanche: No Moisture Cyanosis: No No Abnormalities Noted: Yes Ecchymosis: No Erythema: No Hemosiderin Staining: Yes Mottled: No Pallor: No Rubor: No Temperature / Pain Temperature: No Abnormality Tenderness on Palpation: Yes Treatment Notes Wound #6 (Lower Leg) Wound Laterality: Right, Anterior Cleanser Peri-Wound Care Triamcinolone 15 (g) Discharge Instruction: Use triamcinolone 15 (g) ( in clinic) Zinc Oxide Ointment 30g tube Discharge Instruction: Apply Zinc Oxide to periwound with each dressing change as needed for excoriation Sween Lotion (Moisturizing lotion) Discharge Instruction: Apply moisturizing lotion as directed Topical Primary Dressing sodium thiosulfate ointment Discharge Instruction: to wound bed with each dressing change Secondary Dressing ABD Pad, 5x9 Discharge Instruction: Apply over primary dressing as directed. Woven Gauze Sponge, Non-Sterile 4x4 in Discharge  Instruction: Apply over primary dressing as directed. Secured With Compression Wrap FourPress (4 layer compression wrap) Discharge Instruction: Apply four layer compression as directed. May also use Urgo K2 compression system as alternative. Compression Stockings Add-Ons Electronic Signature(s) Signed: 10/20/2022 5:17:40 PM By:  [name] Gouty RN, BSN Entered By: [name] on 10/20/2022 12:56:51 -------------------------------------------------------------------------------- [place] Details Patient Name: Date of Service: TOREN, SASSER 10/20/2022 12:30 PM Medical Record Number: [id number] Patient Account Number: [id number] Date of Birth/Sex: Treating RN: 08-25-1950 (72 y.o. [name] Primary Care Khala Tarte: PA [name], [place] Other Clinician: Referring Woodford Strege: Treating Eleonor Ocon/Extender: [name] in Treatment: 36 Vital Signs Time Taken: 12:37 Temperature (F): 98.3 Height (in): 71 Pulse (bpm): Leando, [place] ([id number]) [phone].pdf Page 11 of 11 Weight (lbs): 267 Respiratory Rate (breaths/min): 18 Body Mass Index (BMI): 37.2 Blood Pressure (mmHg): 179/77 Reference Range: 80 - 120 mg / dl Electronic Signature(s) Signed: 10/20/2022 5:17:40 PM By: [name] Gouty RN, BSN Entered By: [name] on 10/20/2022 12:37:39

## 2022-10-21 NOTE — Progress Notes (Signed)
SAMIN, HADSELL ([id number]) [id number] Page 1 of 16 Visit Report for 10/20/2022 Chief Complaint Document Details Patient Name: Date of Service: [name], [name] 10/20/2022 12:30 PM Medical Record Number: [id number] Patient Account Number: [id number] Date of Birth/Sex: Treating RN: 08-25-1950 (72 y.o. [name] Primary Care Provider: PA [name], [place] Other Clinician: Referring Provider: Treating Provider/Extender: [name] in Treatment: 36 Information Obtained from: Patient Chief Complaint Patient seen for complaints of Non-Healing Wounds. Electronic Signature(s) Signed: 10/20/2022 1:28:30 PM By: Fredirick Maudlin MD FACS Previous Signature: 10/20/2022 1:28:14 PM Version By: Fredirick Maudlin MD FACS Entered By: Fredirick Maudlin on 10/20/2022 13:28:30 -------------------------------------------------------------------------------- Debridement Details Patient Name: Date of Service: [name], [name] 10/20/2022 12:30 PM Medical Record Number: [id number] Patient Account Number: [id number] Date of Birth/Sex: Treating RN: [birth year] (72 y.o. [name] Primary Care Provider: PA [name], [place] Other Clinician: Referring Provider: Treating Provider/Extender: [name] in Treatment: 36 Debridement Performed for Assessment: Wound #3 Right,Medial Lower Leg Performed By: Physician Fredirick Maudlin, MD Debridement Type: Debridement Level of Consciousness (Pre-procedure): Awake and Alert Pre-procedure Verification/Time Out Yes - 13:05 Taken: Start Time: 13:07 Pain Control: Lidocaine 4% T opical Solution T Area Debrided (L x W): otal 3 (cm) x 5.3 (cm) = 15.9 (cm) Tissue and other material debrided: Viable, Non-Viable, Slough, Subcutaneous, Slough Level: Skin/Subcutaneous Tissue Debridement Description: Excisional Instrument: Curette Bleeding: Minimum Hemostasis Achieved: Pressure Procedural Pain: 1 Post Procedural  Pain: 0 Response to Treatment: Procedure was tolerated well Level of Consciousness (Post- Awake and Alert procedure): Post Debridement Measurements of Total Wound Length: (cm) 5.5 Width: (cm) 5.3 Depth: (cm) 0.6 Volume: (cm) 13.737 Character of Wound/Ulcer Post Debridement: Stable Post Procedure Diagnosis Same as Pre-procedure Notes Scribed for Dr. [name] by Baruch Gouty, RN Electronic Signature(s) Signed: 10/20/2022 4:43:02 PM By: Fredirick Maudlin MD FACS Signed: 10/20/2022 5:17:40 PM By: Baruch Gouty RN, BSN [name], [name] ([id number]) [id number] Page 2 of 16 Entered By: Baruch Gouty on 10/20/2022 13:11:24 -------------------------------------------------------------------------------- Debridement Details Patient Name: Date of Service: [name], [name] 10/20/2022 12:30 PM Medical Record Number: [id number] Patient Account Number: [id number] Date of Birth/Sex: Treating RN: 20-May-1951 (72 y.o. [name] Primary Care Provider: PA [name], [place] Other Clinician: Referring Provider: Treating Provider/Extender: [name] in Treatment: 36 Debridement Performed for Assessment: Wound #6 Right,Anterior Lower Leg Performed By: Physician Fredirick Maudlin, MD Debridement Type: Debridement Level of Consciousness (Pre-procedure): Awake and Alert Pre-procedure Verification/Time Out Yes - 13:05 Taken: Start Time: 13:07 Pain Control: Lidocaine 4% T opical Solution T Area Debrided (L x W): otal 0.8 (cm) x 0.6 (cm) = 0.48 (cm) Tissue and other material debrided: Viable, Non-Viable, Slough, Subcutaneous, Slough Level: Skin/Subcutaneous Tissue Debridement Description: Excisional Instrument: Curette Bleeding: Minimum Hemostasis Achieved: Pressure Procedural Pain: 1 Post Procedural Pain: 0 Response to Treatment: Procedure was tolerated well Level of Consciousness (Post- Awake and Alert procedure): Post Debridement Measurements of  Total Wound Length: (cm) 0.8 Width: (cm) 0.6 Depth: (cm) 0.5 Volume: (cm) 0.188 Character of Wound/Ulcer Post Debridement: Stable Post Procedure Diagnosis Same as Pre-procedure Notes Scribed for Dr. [name] by Baruch Gouty, RN Electronic Signature(s) Signed: 10/20/2022 4:43:02 PM By: Fredirick Maudlin MD FACS Signed: 10/20/2022 5:17:40 PM By: Baruch Gouty RN, BSN Entered By: Baruch Gouty on 10/20/2022 13:12:05 -------------------------------------------------------------------------------- Debridement Details Patient Name: Date of Service: [name], [name] 10/20/2022 12:30 PM Medical Record Number: [id number] Patient Account Number: [id number] Date of Birth/Sex: Treating RN: [birth year] (72 y.o. [name] Primary Care Provider: PA [place], [place] Other  Clinician: Referring Provider: Treating Provider/Extender: [name] in Treatment: 36 Debridement Performed for Assessment: Wound #1 Left,Dorsal Foot Performed By: Physician Fredirick Maudlin, MD Debridement Type: Debridement Level of Consciousness (Pre-procedure): Awake and Alert Pre-procedure Verification/Time Out Yes - 13:05 Taken: Start Time: 13:07 Pain Control: Lidocaine 4% T opical Solution T Area Debrided (L x W): otal 0.4 (cm) x 0.5 (cm) = 0.2 (cm) Tissue and other material debrided: Non-Viable, Eschar Level: Non-Viable Tissue Debridement Description: Selective/Open Wound [name], [name] ([id number]) [phone].pdf Page 3 of 16 Instrument: Curette Bleeding: Minimum Hemostasis Achieved: Pressure Procedural Pain: 1 Post Procedural Pain: 0 Response to Treatment: Procedure was tolerated well Level of Consciousness (Post- Awake and Alert procedure): Post Debridement Measurements of Total Wound Length: (cm) 0.4 Width: (cm) 0.5 Depth: (cm) 0.1 Volume: (cm) 0.016 Character of Wound/Ulcer Post Debridement: Stable Post Procedure Diagnosis Same as  Pre-procedure Notes Scribed for Dr. [name] by Baruch Gouty, RN Electronic Signature(s) Signed: 10/20/2022 4:43:02 PM By: Fredirick Maudlin MD FACS Signed: 10/20/2022 5:17:40 PM By: Baruch Gouty RN, BSN Entered By: Baruch Gouty on 10/20/2022 13:12:58 -------------------------------------------------------------------------------- HPI Details Patient Name: Date of Service: YUE, ORAMAS 10/20/2022 12:30 PM Medical Record Number: [id number] Patient Account Number: [id number] Date of Birth/Sex: Treating RN: 1950-09-05 (72 y.o. [name] Primary Care Provider: PA [name], [place] Other Clinician: Referring Provider: Treating Provider/Extender: [name] in Treatment: 36 History of Present Illness HPI Description: ADMISSION 02/09/2022 This is a 72 year old man who recently moved to [place] from [place]. He has a history of of coronary artery disease and prior left popliteal vein DVT . He is not diabetic and does not smoke. 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 He has had multiple spinal surgeries and has limited mobility. He primarily uses a motorized wheelchair. He has had lymphedema for many years and does have lymphedema pumps although he says he does not use them frequently. He has wounds on his bilateral lower extremities. He was receiving care in [place] for these prior to his move. They were apparently packing his wounds with Betadine soaked gauze. He has worn compression wraps in the past but not recently. He did say that they helped tremendously. In clinic today, his right ABI is noncompressible but has good signals; the left ABI was 1.17. No formal vascular studies have been performed. On his left dorsal foot, there is a circular lesion that exposes the fat layer. There is fairly heavy slough accumulation within the wound, but no significant odor. On his right medial calf, he has multiple pinholes that have slough buildup in them  and probe for about 2 to 4 mm in depth. They are draining clear fluid. On his right lateral midfoot, he has ulceration consistent with venous stasis. It is geographic and has slough accumulation. 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 02/17/2022: All of the wounds are little bit larger today and have more slough accumulation. Today, the medial calf openings are larger and probe to something that feels calcified. There is odor coming from his wounds. His vascular studies are scheduled for August 9 and 10. 02-24-2022 upon evaluation today patient presents for follow-up concerning his ongoing issues here with his lower extremities. With that being said he does have significant problems with what appears to be cellulitis unfortunately the PCR culture that was sent last week got crushed by [employer] in transit. With that being said  we are going to reobtain a culture today although I am actually to do it on the right medial leg as this seems to be the most inflamed area today where there is some questionable drainage as well. I am going to remove some of the calcium deposits which are loosening obtain a deeper culture from this area. Fortunately I do not see any evidence of active infection systemically which is good news but locally I think we are having a bigger issue here. He does have his appointment next Thursday with vascular. Patient has also been referred to [facility] for further evaluation and treatment of the calcinosis for possible sulfate injections. 03/04/2022: The culture that was performed last week grew out multiple species including Klebsiella, Morganella, and Pseudomonas aeruginosa. He had been prescribed Bactrim but this was not adequate to cover all of the species and levofloxacin was recommended. He completed the Bactrim but did not pick up the levofloxacin and begin taking it until yesterday. 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 He had arterial studies performed today. He does have evidence of peripheral arterial disease. Results copied here: +-------+-----------+-----------+------------+------------+ ABI/TBIT oday's ABIT oday's TBIPrevious ABIPrevious TBI +-------+-----------+-----------+------------+------------+ Right [place] 0.75    +-------+-----------+-----------+------------+------------+ Left  0.57    +-------+-----------+-----------+------------+------------+ [name] ([id number]) [id number].pdf Page 4 of 16 Arterial wall calcification precludes accurate ankle pressures and ABIs. Summary: Right: Resting right ankle-brachial index indicates noncompressible right lower extremity arteries. The right toe-brachial index is normal. Left: Resting left ankle-brachial index indicates noncompressible left lower extremity arteries. The left toe-brachial index is abnormal. He continues to have further tissue breakdown at the right medial calf and there is ample slough accumulation along with necrotic subcutaneous tissue. The left dorsal foot wound has built up slough, as well. The right medial foot wound is nearly closed with just a light layer of eschar over the surface. The right dorsal lateral foot wound has also accumulated a fair amount of slough and remains quite tender. 03/10/2022: He has been taking the prescribed levofloxacin and has about 3 days left of this. The erythema and induration on his right leg has improved. He continues to experience further breakdown of the right medial calf wound. There is extensive slough and debris accumulation there. There is heavy slough on his left dorsal foot wound, but no odor or purulent drainage. The right medial foot wound  appears to be closed. The right dorsal lateral foot wound also has a fair amount of slough but it is less tender than at our last visit. He still does not have an appointment with dermatology. 03/22/2022: The right anterior tibial wound has closed. The right lateral foot wound is nearly closed with just a little bit of slough. The left dorsal foot wound is smaller and continues to have some slough buildup but less so than at prior visits. There is good granulation tissue underlying the slough. Unfortunately the right medial calf wound continues to deteriorate. It has a foul odor today and a lot of necrotic tissue, the surrounding skin is also erythematous and moist. 03/30/2022: The right lateral foot wound continues to contract and just has a little bit of slough and eschar present. The left dorsal foot wound is also smaller with slough overlying good granulation tissue. The right medial calf wound actually looks quite a bit better today; there is no odor and there is  much less necrotic tissue although some remains present. We have been using topical gentamicin and he has been taking oral Augmentin. 04/07/2022: The patient saw dermatology at [facility] last week. Unfortunately, it appears that they did not read any of the documentation that was provided. 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 Overall, it was a highly unsatisfactory consultation. In the meantime, however, we were able to identify compounding pharmacy who could create a topical sodium thiosulfate ointment. The patient has that with him today. Both of the right foot wounds are now closed. The left dorsal foot wound has contracted but still has a layer of slough on the  surface. The medial calf wounds on the right all look a little bit better. They still have heavy slough along with the chunks of calcium. Everything is stained purple. 04/14/2022: The wound on his dorsal left foot is a little bit smaller again today. There is still slough accumulation on the surface. The medial calf wounds have quite a bit less slough accumulation today. His right leg, however, is warm and erythematous, concerning for cellulitis. 04/14/2022: He completed his course of Augmentin yesterday. The medial calf wound sites are much cleaner today and shallower. There is less fragmented calcium in the wounds. He has a lot of lymphedema fluid-like drainage from these wounds, but no purulent discharge or malodor. The wound on his dorsal left foot has also contracted and is shallower. There is a layer of slough over healthy granulation tissue. 05/05/2022: The left dorsal foot wound is smaller today with less slough and more granulation tissue. The right medial calf wounds are shallower, but have extensive slough and some fat necrosis. The drainage has diminished considerably, however. 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 05/12/2022: The left dorsal foot wound continues to contract and fill with granulation tissue. There is slough on the wound surface. The right medial calf wounds also continue to fill with healthy tissue, but there is remaining slough and fat necrosis present. He had a significant increase in his drainage this week and it was a bright yellow-green color. He also had a new wound open over his right anterior tibial surface associated with the spike of cutaneous calcium. The leg is more red, as well, concerning for infection. 05/19/2022: His culture returned positive for Pseudomonas. He is currently taking a course of oral levofloxacin. We have also been using  topical gentamicin mixed in with his sodium thiosulfate. All of the wounds look better this week. 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 There is slough and nonviable subcutaneous tissue still present. The new anterior tibial wound just has a light layer of slough. The dorsal foot wound also has some slough present and appears a little dry today. 05/26/2022: The right medial leg wound continues to improve. It is feeling with granulation tissue, but still accumulates a fairly thick layer of slough on the surface. The more recent anterior tibial wound has remained quite small, but also has some slough on the surface. The left dorsal foot wound has contracted somewhat and has perimeter epithelialization. He completed his oral levofloxacin. 06/02/2022: The left dorsal  foot wound continues to improve significantly. There is just a little slough on the wound surface. The right medial leg wound had black drainage, but it looks like silver alginate was applied last week and I theorized that silver sulfide was created with a combination of the silver alginate and the sodium thiosulfate. It did, however, have a bit of an odor to it and extensive slough accumulation. The small anterior tibial wound also had a bit of slough and nonviable subcutaneous tissue present. The skin of his leg was rather macerated, as well. 06/09/2022: The left dorsal foot wound is smaller again this week with just a light layer of slough on the surface. The right medial leg wound looks substantially better. The leg and periwound skin are no longer red and macerated. There is good granulation tissue forming with less slough and nonviable tissue. The anterior tibial wound just has a small amount of slough accumulation. 06/16/2022: The left dorsal foot wound continues to contract. His wrap slipped a little bit, however, and his foot is more swollen. The right medial leg wound continues to improve. The  underlying tissue is more vital-appearing and there is less slough. He does have substantial drainage. The small anterior tibial wound has a bit of slough accumulation. 06/23/2022: The left dorsal foot wound is about half the size as it was last week. There is just some light slough on the surface and some eschar buildup around the edges. The right anterior leg wound is small with a little slough on the surface. Unfortunately, the right medial leg wound deteriorated fairly substantially. It is malodorous with gray/green/black discoloration. 06/30/2022: The left dorsal foot wound continues to contract. It is nearly closed with just a light layer of slough present. The right anterior leg wound is about the same size and has a small amount of slough on the surface. The right medial leg wound looks a lot better than it did last week. The odor has abated and the tissue is less pulpy and necrotic-looking. There is still a fairly thick layer of slough present. 12/13; the left dorsal foot wound looks very healthy. There was no need to change the dressing here and no debridement. He has a small area on the right anterior lower leg apparently had not changed much in size. The area on the medial leg is the most problematic area this is large with some depth and a completely nonviable surface today. 07/14/2022: The left dorsal foot wound is smaller today with just a little slough and eschar present. The right anterior lower leg wound is about the same without any significant change. The medial leg wound has a black and green surface and is malodorous. No purulent drainage and the periwound skin is intact. Edema control is suboptimal. DESTIN, JONASSON ([id number]) [id number] Page 5 of 16 07/21/2022: The dorsal foot wound continues to contract and is nearly closed with just a little bit of slough and eschar on the surface. The right anterior lower leg wound is shallower today but otherwise  the dimensions are unchanged. The medial leg wound looks significantly better although it still has a nonviable surface; the odor and black/green surface has resolved. His culture came back with multiple species sensitive to Augmentin, which was prescribed and a very low level of Pseudomonas, which should be handled by the topical gentamicin we have been using. 07/28/2022: The dorsal foot wound is down to just a very tiny opening with a little eschar on the surface. The right anterior tibial wound is  about the same with some slough buildup. The right medial leg wound looks quite a bit better this week. There is thick rubbery slough on the surface, but the odor and drainage has improved significantly. 08/04/2022: The dorsal foot wound is almost healed, but the periwound skin has broken down. This appears to be secondary to moisture and adhesive from the absorbent pad. The right anterior tibial wound is shallower. The right medial leg wound continues to improve. It is smaller, still with rubbery slough on the surface. No malodor or purulent drainage. 08/11/2022: The dorsal foot wound is about the same size. It is a little bit dry with some slough accumulation. The right anterior tibial wound is also unchanged. The right medial leg wound is filling in further with good granulation tissue. Still with some slough buildup on the surface. 08/18/2022: His left leg is bright red, hot, and swollen with cellulitis. The dorsal foot wound actually looks quite good and is superficial with just a little bit of slough accumulation. The right anterior tibial wound is unchanged. The right medial leg wound has filled in with good granulation tissue but has copious amounts of drainage. 08/25/2022: His cellulitis has responded nicely to the oral antibiotics. His dorsal foot wound is smaller but has a fair amount of slough buildup. The right anterior tibial wound remains stable and unchanged. The right medial leg wound has more  granulation tissue under a layer of slough, but continues to have copious drainage. The drainage is actually causing skin irritation and threatens to result in breakdown. 09/01/2022: He continues to have profuse drainage from his wounds which is resulting in tissue maceration on both the dorsum of his left foot and the periwound distal to his medial leg wound. The dorsal foot wound is nearly healed, despite this. The medial right leg wound is filling in with good granulation tissue. We are working on getting home health assistance to change his dressings more frequently to try and control the drainage better. 09/08/2022: We have had success in controlling his drainage. The tissue maceration has improved significantly and his swelling has come down quite markedly. The medial right leg wound is much more superficial and has substantially less nonviable tissue present. The anterior tibial wound is a little bit larger, however with some nonviable tissue present. 09/15/2022: The dorsal foot wound is nearly closed. The anterior tibial wound measures slightly larger today. The medial right leg wound continues to improve quite dramatically with less nonviable tissue and more robust-looking granulation. Edema control is significantly better. 09/22/2022: The left dorsal foot wound remains open, albeit quite tiny. The anterior tibial wound is a little bit larger again today. The medial right leg wound continues to improve with an increase in robust and healthy granulation tissue and less accumulation of nonviable tissue. 09/29/2022: His home health providers wrapped his left leg extremely tightly and he ended up having to cut the wrap off of his foot. This resulted in his foot being very swollen and that the dorsal foot wound is a little bit larger today. The anterior tibial wound is about the same size with a little slough on the surface. The medial right leg wound continues to fill in. There are very few areas with  any significant depth. There is still slough accumulation. 3/13; Patient has a left dorsal foot wound along with a right anterior tibial wound and right medial wound. We are using silver alginate to the left dorsal foot wound, Santyl and sodium thiosulfate compound ointment to the  right lower extremity wounds under 4 layer compression. Patient has no issues or complaints today. 10/13/2022: The left dorsal foot wound is a little bit bigger today, but his foot is also a little bit more swollen. The right medial leg wound has filled in more and has substantially less depth. There is still some nonviable tissue present. The small anterior lower leg wound looks about the same. 10/20/2022: The left dorsal foot wound is epithelializing. There does not appear to be much open at this site. There is some eschar around the edges. The right medial leg wound continues to fill in with granulation tissue. The small anterior lower leg wound is a little bit bigger and there is a chunk of calcium protruding. He continues to have fairly copious drainage from the right leg. Electronic Signature(s) Signed: 10/20/2022 1:29:41 PM By: Fredirick Maudlin MD FACS Entered By: Fredirick Maudlin on 10/20/2022 13:29:41 -------------------------------------------------------------------------------- Physical Exam Details Patient Name: Date of Service: [name], [name] 10/20/2022 12:30 PM Medical Record Number: [id number] Patient Account Number: [id number] Date of Birth/Sex: Treating RN: 03-11-1951 (72 y.o. [name] Primary Care Provider: PA [name], [place] Other Clinician: Referring Provider: Treating Provider/Extender: [name] in Treatment: 36 Constitutional Hypertensive, asymptomatic. . . . no acute distress. Respiratory Normal work of breathing on room air. Notes 10/20/2022: The left dorsal foot wound is epithelializing. There does not appear to be much open at this site. There is some eschar  around the edges. The right medial leg wound continues to fill in with granulation tissue. The small anterior lower leg wound is a little bit bigger and there is a chunk of calcium protruding. He continues to have fairly copious drainage from the right leg. Electronic Signature(s) Signed: 10/20/2022 1:30:11 PM By: Fredirick Maudlin MD [place], [name] ([id number]) [id number] Page 6 of 16 Entered By: Fredirick Maudlin on 10/20/2022 13:30:11 -------------------------------------------------------------------------------- Physician Orders Details Patient Name: Date of Service: [name], [name] 10/20/2022 12:30 PM Medical Record Number: [id number] Patient Account Number: [id number] Date of Birth/Sex: Treating RN: July 21, 1951 (72 y.o. 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 - Dr. [name] Rm 1 Return A Wednesday 4/3 @ 12:30 pm Anesthetic Wound #1 Left,Dorsal Foot (In clinic) Topical Lidocaine 4% applied to wound bed Wound #3 Right,Medial Lower Leg (In clinic) Topical Lidocaine 4% applied to wound bed Wound #6 Right,Anterior Lower Leg (In clinic) Topical Lidocaine 4% applied to wound bed Bathing/ Shower/ Hygiene May shower with protection but  do not get wound dressing(s) wet. Protect dressing(s) with water repellant cover (for example, large plastic bag) or a cast cover and may then take shower. Edema Control - Lymphedema / SCD / Other Lymphedema Pumps. Use Lymphedema pumps on leg(s) 2-3 times a day for 45-60 minutes. If wearing any wraps or hose, do not remove them. Continue exercising as instructed. - use 1-2 times per day over wraps Elevate legs to the level of the heart or  above for 30 minutes daily and/or when sitting for 3-4 times a day throughout the day. - throughout the day Avoid standing for long periods of time. Exercise regularly Home Health No change in wound care orders this week; continue Home Health for wound care. May utilize formulary equivalent dressing for wound treatment orders unless otherwise specified. Dressing changes to be completed by [place] on Monday / Wednesday / Friday except when patient has scheduled visit at [facility]. - pt seen in clinic on Wednesday Other [place] Orders/Instructions: - [facility] Wound Treatment Wound #1 - Foot Wound Laterality: Dorsal, Left Peri-Wound Care: Zinc Oxide Ointment 30g tube 1 x Per Week/30 Days Discharge Instructions: Apply Zinc Oxide to periwound with each dressing change as needed for excoriation Peri-Wound Care: Sween Lotion (Moisturizing lotion) 1 x Per Week/30 Days Discharge Instructions: Apply moisturizing lotion as directed Prim Dressing: Sorbalgon AG Dressing, 4x4 (in/in) (Generic) 1 x Per Week/30 Days ary Discharge Instructions: or equivalent silver alginate. Apply to wound bed as instructed Secondary Dressing: Woven Gauze Sponge, Non-Sterile 4x4 in (Generic) 1 x Per Week/30 Days Discharge Instructions: Apply over primary dressing as directed. Compression Wrap: FourPress (4 layer compression wrap) (Generic) 1 x Per Week/30 Days Discharge Instructions: Apply four layer compression as directed. May also use Urgo K2 compression system as  alternative. [name], [name] ([id number]) [id number] Page 7 of 16 Wound #3 - Lower Leg Wound Laterality: Right, Medial Peri-Wound Care: Triamcinolone 15 (g) 3 x Per Week/30 Days Discharge Instructions: Use triamcinolone 15 (g) as directed Peri-Wound Care: Zinc Oxide Ointment 30g tube 3 x Per Week/30 Days Discharge Instructions: Apply Zinc Oxide to excoriated periwound with each dressing change as needed Peri-Wound Care: Sween Lotion (Moisturizing lotion) 3 x Per Week/30 Days Discharge Instructions: Apply moisturizing lotion as directed Prim Dressing: Santyl Ointment 3 x Per Week/30 Days ary Discharge Instructions: to wound bed mixed 1:1 with sodium thiosulfate ointment Prim Dressing: sodium thiosulfate ointment 3 x Per Week/30 Days ary Discharge Instructions: 1:1 mix with santyl to wound bed Secondary Dressing: ABD Pad, 8x10 (Generic) 3 x Per Week/30 Days Discharge Instructions: Apply over primary dressing as directed. Secondary Dressing: Drawtex 4x4 in 3 x Per Week/30 Days Discharge Instructions: Apply over primary dressing as directed. Secondary Dressing: Woven Gauze Sponge, Non-Sterile 4x4 in (Generic) 3 x Per Week/30 Days Discharge Instructions: Apply over primary dressing as directed. Secondary Dressing: Zetuvit Plus 4x8 in 3 x Per Week/30 Days Discharge Instructions: Apply over primary dressing as directed. Compression Wrap: FourPress (4 layer compression wrap) (Generic) 3 x Per Week/30 Days Discharge Instructions: Apply four layer compression as directed. May also use Urgo K2 compression system as alternative. Wound #6 - Lower Leg Wound Laterality: Right, Anterior Peri-Wound Care: Triamcinolone 15 (g) 3 x Per Week/30 Days Discharge Instructions: Use triamcinolone 15 (g) ( in clinic) Peri-Wound Care: Zinc Oxide Ointment 30g tube 3 x Per Week/30 Days Discharge Instructions: Apply Zinc Oxide to periwound with each dressing change as needed for  excoriation Peri-Wound Care: Sween Lotion (Moisturizing lotion) 3 x Per Week/30 Days Discharge Instructions: Apply moisturizing lotion as directed Prim Dressing: sodium thiosulfate ointment 3 x Per Week/30 Days ary Discharge Instructions: to wound bed with each dressing change Secondary Dressing: ABD Pad, 5x9 (Generic) 3 x Per Week/30 Days Discharge Instructions: Apply over primary dressing as directed. Secondary Dressing: Woven Gauze Sponge, Non-Sterile 4x4 in (Generic) 3 x Per Week/30 Days Discharge Instructions: Apply over primary dressing as directed. Compression Wrap: FourPress (4 layer compression wrap) (Generic) 3  x Per Week/30 Days Discharge Instructions: Apply four layer compression as directed. May also use Urgo K2 compression system as alternative. Electronic Signature(s) Signed: 10/20/2022 4:43:02 PM By: Fredirick Maudlin MD FACS Entered By: Fredirick Maudlin on 10/20/2022 13:30:24 -------------------------------------------------------------------------------- Problem List Details Patient Name: Date of Service: [name], [name] 10/20/2022 12:30 PM Medical Record Number: [id number] Patient Account Number: [id number] Date of Birth/Sex: Treating RN: 05/08/1951 (72 y.o. 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) 02/09/2022 No Yes I10 Essential (primary) hypertension 02/09/2022 No Yes I25.10 Atherosclerotic  heart disease of native coronary artery without angina pectoris 02/09/2022 No Yes I89.0 Lymphedema, not elsewhere classified 02/09/2022 No Yes Z86.718 Personal history of other venous thrombosis and embolism 02/09/2022 No Yes Inactive Problems ICD-10 Code Description Active Date Inactive Date L97.512 Non-pressure chronic ulcer of other part of right foot with fat layer exposed 02/17/2022 02/17/2022 Resolved Problems Electronic Signature(s) Signed: 10/20/2022 1:27:51 PM By: Fredirick Maudlin MD FACS Entered By: Fredirick Maudlin on 10/20/2022 13:27:51 -------------------------------------------------------------------------------- Progress Note Details Patient Name: Date of Service: [name] 10/20/2022 12:30 PM Medical Record Number: [id number] Patient Account Number: [id number] Date of Birth/Sex: Treating RN: [birth year] (72 y.o. [name] Primary Care Provider: PA [name], [place] Other Clinician: Referring Provider: Treating Provider/Extender: [name] in Treatment: 36 Subjective Chief Complaint Information obtained from Patient Patient seen for complaints of Non-Healing Wounds. History of Present Illness (HPI) ADMISSION 02/09/2022 This is a 72 year old man who recently moved to [place] from [place]. He has a history of of coronary artery disease and prior left popliteal vein DVT. He is not diabetic and does not smoke. 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 He has had multiple spinal surgeries and has limited mobility. He primarily uses a motorized wheelchair. He has had lymphedema for many years and does have lymphedema pumps although he says he does not use [name], [name] ([id number]) [id number] Page 9 of 16 them frequently. He has wounds on his bilateral lower extremities. He was receiving care in [place] for these prior to his move. They were apparently packing his wounds with Betadine soaked gauze. He has  worn compression wraps in the past but not recently. He did say that they helped tremendously. In clinic today, his right ABI is noncompressible but has good signals; the left ABI was 1.17. No formal vascular studies have been performed. On his left dorsal foot, there is a circular lesion that exposes the fat layer. There is fairly heavy slough accumulation within the wound, but no significant odor. On his right medial calf, he has multiple pinholes that have slough buildup in them and probe for about 2 to 4 mm in depth. They are draining clear fluid. On his right lateral midfoot, he has ulceration consistent with venous stasis. It is geographic and has slough accumulation. 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 02/17/2022: All of the wounds are little bit larger today and have more slough accumulation. Today,  the medial calf openings are larger and probe to something that feels calcified. There is odor coming from his wounds. His vascular studies are scheduled for August 9 and 10. 02-24-2022 upon evaluation today patient presents for follow-up concerning his ongoing issues here with his lower extremities. With that being said he does have significant problems with what appears to be cellulitis unfortunately the PCR culture that was sent last week got crushed by UPS in transit. With that being said we are going to reobtain a culture today although I am actually to do it on the right medial leg as this seems to be the most inflamed area today where there is some questionable drainage as well. I am going to remove some of the calcium deposits which are loosening obtain a deeper culture from this area. Fortunately I do not see any evidence of active infection systemically which is good news but locally I think we are having a bigger issue here. He does have his appointment next Thursday with vascular. Patient has also  been referred to [facility] for further evaluation and treatment of the calcinosis for possible sulfate injections. 03/04/2022: The culture that was performed last week grew out multiple species including Klebsiella, Morganella, and Pseudomonas aeruginosa. He had been prescribed Bactrim but this was not adequate to cover all of the species and levofloxacin was recommended. He completed the Bactrim but did not pick up the levofloxacin and begin taking it until yesterday. 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 He had arterial studies performed today. He does have evidence of peripheral arterial disease. Results copied here: +-------+-----------+-----------+------------+------------+ ABI/TBIT oday's ABIT oday's TBIPrevious ABIPrevious TBI +-------+-----------+-----------+------------+------------+ Right [place] 0.75    +-------+-----------+-----------+------------+------------+ Left [place] 0.57    +-------+-----------+-----------+------------+------------+ Arterial wall calcification precludes accurate ankle pressures and ABIs. Summary: Right: Resting right ankle-brachial index indicates noncompressible right lower extremity arteries. The right toe-brachial index is normal. Left: Resting left ankle-brachial index indicates noncompressible left lower extremity arteries. The left toe-brachial index is abnormal. He continues to have further tissue breakdown at the right medial calf and there is ample slough accumulation along with necrotic subcutaneous tissue. The left dorsal foot wound has built up slough, as well. The right medial foot wound is nearly closed with just a light layer of eschar over the surface. The right dorsal lateral foot wound has also accumulated a fair amount of slough and remains quite  tender. 03/10/2022: He has been taking the prescribed levofloxacin and has about 3 days left of this. The erythema and induration on his right leg has improved. He continues to experience further breakdown of the right medial calf wound. There is extensive slough and debris accumulation there. There is heavy slough on his left dorsal foot wound, but no odor or purulent drainage. The right medial foot wound appears to be closed. The right dorsal lateral foot wound also has a fair amount of slough but it is less tender than at our last visit. He still does not have an appointment with dermatology. 03/22/2022: The right anterior tibial wound has closed. The right lateral foot wound is nearly closed with just a little bit of slough. The left dorsal foot wound is smaller and continues to have some slough buildup but less so than at prior visits. There is good granulation tissue underlying the slough. Unfortunately the right medial calf wound continues to deteriorate.  It has a foul odor today and a lot of necrotic tissue, the surrounding skin is also erythematous and moist. 03/30/2022: The right lateral foot wound continues to contract and just has a little bit of slough and eschar present. The left dorsal foot wound is also smaller with slough overlying good granulation tissue. The right medial calf wound actually looks quite a bit better today; there is no odor and there is much less necrotic tissue although some remains present. We have been using topical gentamicin and he has been taking oral Augmentin. 04/07/2022: The patient saw dermatology at [facility] last week. Unfortunately, it appears that they did not read any of the documentation that was provided. 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 Overall, it was a highly unsatisfactory consultation. In the meantime, however, we were able to identify compounding pharmacy who could create a topical sodium thiosulfate ointment. The patient has that with him today. Both of the right foot wounds are now closed. The left dorsal foot wound has contracted but still has a layer of slough on the surface. The medial calf wounds on the right all look a little bit better. They still have heavy slough along with the chunks of calcium. Everything is stained purple. 04/14/2022: The wound on his dorsal left foot is a little bit smaller again today. There is still slough accumulation on the surface. The medial calf wounds have quite a bit less slough accumulation today. His right leg, however, is warm and erythematous, concerning for cellulitis. 04/14/2022: He completed his course of Augmentin yesterday. The medial calf wound sites are much cleaner today and shallower. There is less fragmented calcium in the wounds. He has a lot of lymphedema fluid-like drainage from these wounds, but no purulent discharge or malodor. The wound on his dorsal left foot has also contracted and is shallower. There is a layer of slough over healthy granulation tissue. 05/05/2022: The left dorsal foot wound is smaller today with less slough and more granulation tissue. The right medial calf wounds are shallower, but have extensive slough and some fat necrosis. The drainage has diminished considerably, however. 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 05/12/2022: The left dorsal foot wound continues to contract and fill with granulation tissue. There is slough on the wound surface. The right medial calf wounds also continue to fill with healthy tissue, but there is remaining slough and fat necrosis present. He  had a significant increase in his drainage this week and it was a bright yellow-green color. He also had a new wound open over his right anterior tibial surface associated with the spike of cutaneous calcium. The leg is more red, as well, concerning for infection. 05/19/2022: His culture returned positive for Pseudomonas. He is currently taking a course of oral levofloxacin. We have also been using topical gentamicin mixed in with his sodium thiosulfate. All of the wounds look better this week. 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 There is slough and nonviable subcutaneous tissue still present. The new anterior tibial wound just has a light layer of slough. The dorsal foot wound also has some slough  present and appears a little dry today. [name], [name] ([id number]) [id number] Page 10 of 16 05/26/2022: The right medial leg wound continues to improve. It is feeling with granulation tissue, but still accumulates a fairly thick layer of slough on the surface. The more recent anterior tibial wound has remained quite small, but also has some slough on the surface. The left dorsal foot wound has contracted somewhat and has perimeter epithelialization. He completed his oral levofloxacin. 06/02/2022: The left dorsal foot wound continues to improve significantly. There is just a little slough on the wound surface. The right medial leg wound had black drainage, but it looks like silver alginate was applied last week and I theorized that silver sulfide was created with a combination of the silver alginate and the sodium thiosulfate. It did, however, have a bit of an odor to it and extensive slough accumulation. The small anterior tibial wound also had a bit of slough and nonviable subcutaneous tissue present. The skin of his leg was rather macerated, as well. 06/09/2022: The left dorsal foot wound is smaller again this week with just a light layer of  slough on the surface. The right medial leg wound looks substantially better. The leg and periwound skin are no longer red and macerated. There is good granulation tissue forming with less slough and nonviable tissue. The anterior tibial wound just has a small amount of slough accumulation. 06/16/2022: The left dorsal foot wound continues to contract. His wrap slipped a little bit, however, and his foot is more swollen. The right medial leg wound continues to improve. The underlying tissue is more vital-appearing and there is less slough. He does have substantial drainage. The small anterior tibial wound has a bit of slough accumulation. 06/23/2022: The left dorsal foot wound is about half the size as it was last week. There is just some light slough on the surface and some eschar buildup around the edges. The right anterior leg wound is small with a little slough on the surface. Unfortunately, the right medial leg wound deteriorated fairly substantially. It is malodorous with gray/green/black discoloration. 06/30/2022: The left dorsal foot wound continues to contract. It is nearly closed with just a light layer of slough present. The right anterior leg wound is about the same size and has a small amount of slough on the surface. The right medial leg wound looks a lot better than it did last week. The odor has abated and the tissue is less pulpy and necrotic-looking. There is still a fairly thick layer of slough present. 12/13; the left dorsal foot wound looks very healthy. There was no need to change the dressing here and no debridement. He has a small area on the right anterior lower leg apparently had not changed much in size. The area on the medial leg is the most problematic area this is large with some depth and a completely nonviable surface today. 07/14/2022: The left dorsal foot wound is smaller today with just a little slough and eschar present. The right anterior lower leg wound is about the  same without any significant change. The medial leg wound has a black and green surface and is malodorous. No purulent drainage and the periwound skin is intact. Edema control is suboptimal. 07/21/2022: The dorsal foot wound continues to contract and is nearly closed with just a little bit of slough and eschar on the surface. The right anterior lower leg wound is shallower today but otherwise the dimensions are unchanged. The medial leg wound looks significantly  better although it still has a nonviable surface; the odor and black/green surface has resolved. His culture came back with multiple species sensitive to Augmentin, which was prescribed and a very low level of Pseudomonas, which should be handled by the topical gentamicin we have been using. 07/28/2022: The dorsal foot wound is down to just a very tiny opening with a little eschar on the surface. The right anterior tibial wound is about the same with some slough buildup. The right medial leg wound looks quite a bit better this week. There is thick rubbery slough on the surface, but the odor and drainage has improved significantly. 08/04/2022: The dorsal foot wound is almost healed, but the periwound skin has broken down. This appears to be secondary to moisture and adhesive from the absorbent pad. The right anterior tibial wound is shallower. The right medial leg wound continues to improve. It is smaller, still with rubbery slough on the surface. No malodor or purulent drainage. 08/11/2022: The dorsal foot wound is about the same size. It is a little bit dry with some slough accumulation. The right anterior tibial wound is also unchanged. The right medial leg wound is filling in further with good granulation tissue. Still with some slough buildup on the surface. 08/18/2022: His left leg is bright red, hot, and swollen with cellulitis. The dorsal foot wound actually looks quite good and is superficial with just a little bit of slough accumulation.  The right anterior tibial wound is unchanged. The right medial leg wound has filled in with good granulation tissue but has copious amounts of drainage. 08/25/2022: His cellulitis has responded nicely to the oral antibiotics. His dorsal foot wound is smaller but has a fair amount of slough buildup. The right anterior tibial wound remains stable and unchanged. The right medial leg wound has more granulation tissue under a layer of slough, but continues to have copious drainage. The drainage is actually causing skin irritation and threatens to result in breakdown. 09/01/2022: He continues to have profuse drainage from his wounds which is resulting in tissue maceration on both the dorsum of his left foot and the periwound distal to his medial leg wound. The dorsal foot wound is nearly healed, despite this. The medial right leg wound is filling in with good granulation tissue. We are working on getting home health assistance to change his dressings more frequently to try and control the drainage better. 09/08/2022: We have had success in controlling his drainage. The tissue maceration has improved significantly and his swelling has come down quite markedly. The medial right leg wound is much more superficial and has substantially less nonviable tissue present. The anterior tibial wound is a little bit larger, however with some nonviable tissue present. 09/15/2022: The dorsal foot wound is nearly closed. The anterior tibial wound measures slightly larger today. The medial right leg wound continues to improve quite dramatically with less nonviable tissue and more robust-looking granulation. Edema control is significantly better. 09/22/2022: The left dorsal foot wound remains open, albeit quite tiny. The anterior tibial wound is a little bit larger again today. The medial right leg wound continues to improve with an increase in robust and healthy granulation tissue and less accumulation of nonviable  tissue. 09/29/2022: His home health providers wrapped his left leg extremely tightly and he ended up having to cut the wrap off of his foot. This resulted in his foot being very swollen and that the dorsal foot wound is a little bit larger today. The anterior tibial wound  is about the same size with a little slough on the surface. The medial right leg wound continues to fill in. There are very few areas with any significant depth. There is still slough accumulation. 3/13; Patient has a left dorsal foot wound along with a right anterior tibial wound and right medial wound. We are using silver alginate to the left dorsal foot wound, Santyl and sodium thiosulfate compound ointment to the right lower extremity wounds under 4 layer compression. Patient has no issues or complaints today. 10/13/2022: The left dorsal foot wound is a little bit bigger today, but his foot is also a little bit more swollen. The right medial leg wound has filled in more and has substantially less depth. There is still some nonviable tissue present. The small anterior lower leg wound looks about the same. 10/20/2022: The left dorsal foot wound is epithelializing. There does not appear to be much open at this site. There is some eschar around the edges. The right medial leg wound continues to fill in with granulation tissue. The small anterior lower leg wound is a little bit bigger and there is a chunk of calcium protruding. He continues to have fairly copious drainage from the right leg. Patient History Information obtained from Patient, Chart. Family History MACKLEN, WHIDBEE ([id number]) [id number] Page 25 of 39 Cancer - Mother, Heart Disease - Father,Mother, Hypertension - Father,Siblings, Lung Disease - Siblings, No family history of Diabetes, Hereditary Spherocytosis, Kidney Disease, Seizures, Stroke, Thyroid Problems, Tuberculosis. Social History Former smoker - quit 1991, Marital Status - Married,  Alcohol Use - Moderate, Drug Use - No History, Caffeine Use - Daily - coffee. Medical History Eyes Patient has history of Cataracts - right eye removed Denies history of Glaucoma, Optic Neuritis Ear/Nose/Mouth/Throat Denies history of Chronic sinus problems/congestion, Middle ear problems Hematologic/Lymphatic Patient has history of Anemia - macrocytic, Lymphedema Cardiovascular Patient has history of Congestive Heart Failure, Deep Vein Thrombosis - left leg, Hypertension, Peripheral Venous Disease Endocrine Denies history of Type I Diabetes, Type II Diabetes Genitourinary Denies history of End Stage Renal Disease Integumentary (Skin) Denies history of History of Burn Oncologic Denies history of Received Chemotherapy, Received Radiation Psychiatric Denies history of Anorexia/bulimia, Confinement Anxiety Hospitalization/Surgery History - cervical fusion. - lumbar surgery. - coronary stent placement. - lasik eye surgery. - hydrocele excision. Medical A Surgical History Notes nd Constitutional Symptoms (General Health) obesity Ear/Nose/Mouth/Throat hard of hearing Respiratory frozen diaphragm right Cardiovascular hyperlipidemia Musculoskeletal myositis, lumbar DDD, spinal stenosis, cervical facet joint syndrome Objective Constitutional Hypertensive, asymptomatic. no acute distress. Vitals Time Taken: 12:37 PM, Height: 71 in, Weight: 267 lbs, BMI: 37.2, Temperature: 98.3 F, Pulse: 74 bpm, Respiratory Rate: 18 breaths/min, Blood Pressure: 179/77 mmHg. Respiratory Normal work of breathing on room air. General Notes: 10/20/2022: The left dorsal foot wound is epithelializing. There does not appear to be much open at this site. There is some eschar around the edges. The right medial leg wound continues to fill in with granulation tissue. The small anterior lower leg wound is a little bit bigger and there is a chunk of calcium protruding. He continues to have fairly copious drainage  from the right leg. Integumentary (Hair, Skin) Wound #1 status is Open. Original cause of wound was Gradually Appeared. The date acquired was: 12/07/2021. The wound has been in treatment 36 weeks. The wound is located on the Left,Dorsal Foot. The wound measures 0.4cm length x 0.5cm width x 0.1cm depth; 0.157cm^2 area and 0.016cm^3 volume. There is Fat Layer (Subcutaneous Tissue) exposed. There  is no tunneling or undermining noted. There is a medium amount of serous drainage noted. The wound margin is distinct with the outline attached to the wound base. There is large (67-100%) pink, pale granulation within the wound bed. There is a small (1-33%) amount of necrotic tissue within the wound bed including Adherent Slough. The periwound skin appearance had no abnormalities noted for texture. The periwound skin appearance exhibited: Dry/Scaly, Hemosiderin Staining. The periwound skin appearance did not exhibit: Maceration, Rubor, Erythema. Periwound temperature was noted as No Abnormality. The periwound has tenderness on palpation. Wound #3 status is Open. Original cause of wound was Gradually Appeared. The date acquired was: 12/07/2021. The wound has been in treatment 36 weeks. The wound is located on the Right,Medial Lower Leg. The wound measures 5.5cm length x 5.3cm width x 0.6cm depth; 22.894cm^2 area and 13.737cm^3 volume. There is Fat Layer (Subcutaneous Tissue) exposed. There is no tunneling or undermining noted. There is a large amount of serous drainage noted. The wound margin is distinct with the outline attached to the wound base. There is large (67-100%) red granulation within the wound bed. There is a small (1-33%) amount of necrotic tissue within the wound bed including Adherent Slough. The periwound skin appearance exhibited: Excoriation, Hemosiderin Staining, Erythema. The periwound skin appearance did not exhibit: Dry/Scaly, Maceration, Rubor. The surrounding wound skin color is noted with  erythema. Periwound temperature was noted as No Abnormality. The periwound has tenderness on palpation. Wound #6 status is Open. Original cause of wound was Gradually Appeared. The date acquired was: 05/12/2022. The wound has been in treatment 23 weeks. The wound is located on the Right,Anterior Lower Leg. The wound measures 0.8cm length x 0.6cm width x 0.5cm depth; 0.377cm^2 area and 0.188cm^3 volume. There is Fat Layer (Subcutaneous Tissue) exposed. There is no tunneling or undermining noted. There is a medium amount of serous drainage noted. The wound margin is epibole. There is no granulation within the wound bed. There is a small (1-33%) amount of necrotic tissue within the wound bed including Adherent Slough. The periwound skin appearance had no abnormalities noted for texture. The periwound skin appearance had no abnormalities noted for moisture. The periwound skin appearance exhibited: Hemosiderin Staining. The periwound skin appearance did not exhibit: Lindel, Vanasten ([id number]) [id number] Page 12 of 16 Ecchymosis, Mottled, Pallor, Rubor, Erythema. Periwound temperature was noted as No Abnormality. The periwound has tenderness on palpation. 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 There was a Selective/Open Wound Non-Viable Tissue Debridement with a total area of 0.2 sq cm performed by Fredirick Maudlin, MD. With  the following instrument(s): Curette to remove Non-Viable tissue/material. Material removed includes Eschar after achieving pain control using Lidocaine 4% T opical Solution. No specimens were taken. A time out was conducted at 13:05, prior to the start of the procedure. A Minimum amount of bleeding was controlled with Pressure. The procedure was tolerated well with a pain level of 1 throughout and a pain level of 0 following the procedure. Post Debridement Measurements: 0.4cm length x 0.5cm width x 0.1cm depth; 0.016cm^3 volume.  Character of Wound/Ulcer Post Debridement is stable. Post procedure Diagnosis Wound #1: Same as Pre-Procedure General Notes: Scribed for Dr. [name] by Baruch Gouty, RN. Pre-procedure diagnosis of Wound #1 is a Lymphedema located on the Left,Dorsal Foot . There was a Double Layer Compression Therapy Procedure by Baruch Gouty, RN. Post procedure Diagnosis Wound #1: Same as Pre-Procedure Wound #3 Pre-procedure diagnosis of Wound #3 is a Lymphedema located on the Right,Medial Lower Leg . There was a Excisional Skin/Subcutaneous Tissue Debridement with a total area of 15.9 sq cm performed by Fredirick Maudlin, MD. With the following instrument(s): Curette to remove Viable and Non-Viable tissue/material. Material removed includes Subcutaneous Tissue and Slough and after achieving pain control using Lidocaine 4% T opical Solution. No specimens were taken. A time out was conducted at 13:05, prior to the start of the procedure. A Minimum amount of bleeding was controlled with Pressure. The procedure was tolerated well with a pain level of 1 throughout and a pain level of 0 following the procedure. Post Debridement Measurements: 5.5cm length x 5.3cm width x 0.6cm depth; 13.737cm^3 volume. Character of Wound/Ulcer Post Debridement is stable. Post procedure Diagnosis Wound #3: Same as Pre-Procedure General Notes: Scribed for Dr. [name] by Baruch Gouty, RN. Pre-procedure  diagnosis of Wound #3 is a Lymphedema located on the Right,Medial Lower Leg . There was a Double Layer Compression Therapy Procedure by Baruch Gouty, RN. 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 There was a Excisional Skin/Subcutaneous Tissue Debridement with a total area of 0.48 sq cm performed by Fredirick Maudlin, MD. With the following instrument(s): Curette to remove Viable and Non-Viable tissue/material. Material removed includes Subcutaneous Tissue and Slough and after achieving pain control using Lidocaine 4% T opical Solution. No specimens were taken. A time out was conducted at 13:05, prior to the start of the procedure. A Minimum amount of bleeding was controlled with Pressure. The procedure was tolerated well with a pain level of 1 throughout and a pain level of 0 following the procedure. Post Debridement Measurements: 0.8cm length x 0.6cm width x 0.5cm depth; 0.188cm^3 volume. Character of Wound/Ulcer Post Debridement is stable. Post procedure Diagnosis Wound #6: Same as Pre-Procedure General Notes: Scribed for Dr. [name] by Baruch Gouty, RN. Plan Follow-up Appointments: Return Appointment in 1 week. - Dr. [name] Rm 1 Wednesday 4/3 @ 12:30 pm Anesthetic: Wound #1 Left,Dorsal Foot: (In clinic) Topical Lidocaine 4% applied to wound bed Wound #3 Right,Medial Lower Leg: (In clinic) Topical Lidocaine 4% applied to wound bed Wound #6 Right,Anterior Lower Leg: (In clinic) Topical Lidocaine 4% applied to wound bed Bathing/ Shower/ Hygiene: May shower with protection but do not get wound dressing(s) wet. Protect dressing(s) with water repellant cover (for example, large plastic bag) or a cast cover and may then take shower. Edema Control - Lymphedema / SCD / Other: Lymphedema Pumps. Use Lymphedema pumps on leg(s) 2-3 times a day for 45-60 minutes. If wearing any  wraps or hose, do not remove them. MARTRELL, RUMMLER ([id number]) [id number] Page 13 of 16 exercising as instructed. - use 1-2 times per day over wraps Elevate legs to the level of the heart or above for 30 minutes daily and/or when sitting for 3-4 times a day throughout the day. - throughout the day Avoid standing for long periods of time. Exercise regularly Home Health: No change in wound care orders this week; continue Home Health for wound care.  May utilize formulary equivalent dressing for wound treatment orders unless otherwise specified. Dressing changes to be completed by [facility] on Monday / Wednesday / Friday except when patient has scheduled visit at [facility]. - pt seen in clinic on Wednesday Other [place] Orders/Instructions: - [facility] WOUND #1: - Foot Wound Laterality: Dorsal, Left Peri-Wound Care: Zinc Oxide Ointment 30g tube 1 x Per Week/30 Days Discharge Instructions: Apply Zinc Oxide to periwound with each dressing change as needed for excoriation Peri-Wound Care: Sween Lotion (Moisturizing lotion) 1 x Per Week/30 Days Discharge Instructions: Apply moisturizing lotion as directed Prim Dressing: Sorbal[employer] Dressing, 4x4 (in/in) (Generic) 1 x Per Week/30 Days ary Discharge Instructions: or equivalent silver alginate. Apply to wound bed as instructed Secondary Dressing: Woven Gauze Sponge, Non-Sterile 4x4 in (Generic) 1 x Per Week/30 Days Discharge Instructions: Apply over primary dressing as directed. Com pression Wrap: FourPress (4 layer compression wrap) (Generic) 1 x Per Week/30 Days Discharge Instructions: Apply four layer compression as directed. May also use Urgo K2 compression system as alternative. WOUND #3: - Lower Leg Wound Laterality: Right, Medial Peri-Wound Care: Triamcinolone 15 (g) 3 x Per Week/30 Days Discharge Instructions: Use triamcinolone 15 (g) as directed Peri-Wound Care: Zinc Oxide Ointment 30g tube 3  x Per Week/30 Days Discharge Instructions: Apply Zinc Oxide to excoriated periwound with each dressing change as needed Peri-Wound Care: Sween Lotion (Moisturizing lotion) 3 x Per Week/30 Days Discharge Instructions: Apply moisturizing lotion as directed Prim Dressing: Santyl Ointment 3 x Per Week/30 Days ary Discharge Instructions: to wound bed mixed 1:1 with sodium thiosulfate ointment Prim Dressing: sodium thiosulfate ointment 3 x Per Week/30 Days ary Discharge Instructions: 1:1 mix with santyl to wound bed Secondary Dressing: ABD Pad, 8x10 (Generic) 3 x Per Week/30 Days Discharge Instructions: Apply over primary dressing as directed. Secondary Dressing: Drawtex 4x4 in 3 x Per Week/30 Days Discharge Instructions: Apply over primary dressing as directed. Secondary Dressing: Woven Gauze Sponge, Non-Sterile 4x4 in (Generic) 3 x Per Week/30 Days Discharge Instructions: Apply over primary dressing as directed. Secondary Dressing: Zetuvit Plus 4x8 in 3 x Per Week/30 Days Discharge Instructions: Apply over primary dressing as directed. Com pression Wrap: FourPress (4 layer compression wrap) (Generic) 3 x Per Week/30 Days Discharge Instructions: Apply four layer compression as directed. May also use Urgo K2 compression system as alternative. WOUND #6: - Lower Leg Wound Laterality: Right, Anterior Peri-Wound Care: Triamcinolone 15 (g) 3 x Per Week/30 Days Discharge Instructions: Use triamcinolone 15 (g) ( in clinic) Peri-Wound Care: Zinc Oxide Ointment 30g tube 3 x Per Week/30 Days Discharge Instructions: Apply Zinc Oxide to periwound with each dressing change as needed for excoriation Peri-Wound Care: Sween Lotion (Moisturizing lotion) 3 x Per Week/30 Days Discharge Instructions: Apply moisturizing lotion as directed Prim Dressing: sodium thiosulfate ointment 3 x Per Week/30 Days ary Discharge Instructions: to wound bed with each dressing change Secondary Dressing: ABD Pad, 5x9 (Generic) 3 x  Per Week/30 Days Discharge Instructions: Apply over primary dressing as directed. Secondary Dressing: Woven Gauze Sponge, Non-Sterile 4x4 in (Generic) 3 x Per Week/30 Days Discharge Instructions: Apply over primary dressing as directed. Com pression Wrap: FourPress (4 layer compression wrap) (Generic) 3 x Per Week/30 Days Discharge Instructions: Apply four layer compression as directed. May also use Urgo K2 compression system as alternative. 10/20/2022: The left dorsal foot wound is epithelializing. There does not appear to be much open at this site. There is some eschar around the edges. The right medial leg wound  continues to fill in with granulation tissue. The small anterior lower leg wound is a little bit bigger and there is a chunk of calcium protruding. He continues to have fairly copious drainage from the right leg. I used a curette to debride slough and subcutaneous tissue from both of the right sided leg wounds. I debrided eschar off of the dorsal foot wound. We will continue Santyl and compounded sodium thiosulfate to both of the right sided leg wounds. We will add additional absorbent layers including drawtex and zetuvit pads. Continue silver alginate to the left dorsal foot wound. Continue bilateral 4-layer compression. Follow-up in 1 week. Electronic Signature(s) Signed: 10/20/2022 1:31:42 PM By: Fredirick Maudlin MD FACS Entered By: Fredirick Maudlin on 10/20/2022 13:31:42 -------------------------------------------------------------------------------- [facility] Details Patient Name: Date of Service: NYSHAUN, JAQUET [name] 10/20/2022 12:30 PM Medical Record Number: [id number] Patient Account Number: [id number] Date of Birth/Sex: Treating RN: 22-Jun-1951 (72 y.o. [name] Primary Care Provider: PA [name], [place] Other Clinician: Referring Provider: Treating Provider/Extender: [name] in Treatment: [address], [place] ([id number]) [id number]  Page 14 of 16 Information Obtained From Patient Chart Constitutional Symptoms (General Health) Medical History: Past Medical History Notes: obesity Eyes Medical History: Positive for: Cataracts - right eye removed Negative for: Glaucoma; Optic Neuritis Ear/Nose/Mouth/Throat Medical History: Negative for: Chronic sinus problems/congestion; Middle ear problems Past Medical History Notes: hard of hearing Hematologic/Lymphatic Medical History: Positive for: Anemia - macrocytic; Lymphedema Respiratory Medical History: Past Medical History Notes: frozen diaphragm right Cardiovascular Medical History: Positive for: Congestive Heart Failure; Deep Vein Thrombosis - left leg; Hypertension; Peripheral Venous Disease Past Medical History Notes: hyperlipidemia Endocrine Medical History: Negative for: Type I Diabetes; Type II Diabetes Genitourinary Medical History: Negative for: End Stage Renal Disease Integumentary (Skin) Medical History: Negative for: History of Burn Musculoskeletal Medical History: Past Medical History Notes: myositis, lumbar DDD, spinal stenosis, cervical facet joint syndrome Oncologic Medical History: Negative for: Received Chemotherapy; Received Radiation Psychiatric Medical History: Negative for: Anorexia/bulimia; Confinement Anxiety HBO Extended History Items Eyes: Cataracts Immunizations Pneumococcal Vaccine: Received Pneumococcal VaccinationKIYAAN, BLANKENBILLER ([id number]) [id number] Page 15 of 16 Received Pneumococcal Vaccination On or After 60th Birthday: Yes Implantable Devices No devices added Hospitalization / Surgery History Type of Hospitalization/Surgery cervical fusion lumbar surgery coronary stent placement lasik eye surgery hydrocele excision Family and Social History Cancer: Yes - Mother; Diabetes: No; Heart Disease: Yes - Father,Mother; Hereditary Spherocytosis: No; Hypertension: Yes -  Father,Siblings; Kidney Disease: No; Lung Disease: Yes - Siblings; Seizures: No; Stroke: No; Thyroid Problems: No; Tuberculosis: No; Former smoker - quit 1991; Marital Status - Married; Alcohol Use: Moderate; Drug Use: No History; Caffeine Use: Daily - coffee; Financial Concerns: No; Food, Clothing or Shelter Needs: No; Support System Lacking: No; Transportation Concerns: No Electronic Signature(s) Signed: 10/20/2022 4:43:02 PM By: Fredirick Maudlin MD FACS Signed: 10/20/2022 5:17:40 PM By: Baruch Gouty RN, BSN Entered By: Fredirick Maudlin on 10/20/2022 13:29:48 -------------------------------------------------------------------------------- [place] Details Patient Name: Date of Service: CHALES, UMEMOTO 10/20/2022 Medical Record Number: [id number] Patient Account Number: [id number] Date of Birth/Sex: Treating RN: 07-13-1951 (72 y.o. [name] Primary Care Provider: PA [name], [place] Other Clinician: Referring Provider: Treating Provider/Extender: [name] in Treatment: 36 Diagnosis Coding ICD-10 Codes Code Description [phone] Non-pressure chronic ulcer of other part of right lower leg with fat layer exposed L97.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) I10 Essential (primary) hypertension I25.10 Atherosclerotic heart  disease of native coronary artery without angina pectoris I89.0 Lymphedema, not elsewhere classified Z86.718 Personal history of other venous thrombosis and embolism Facility Procedures : CPT4 Code: [id number] Description: 11042 - DEB SUBQ TISSUE 20 SQ CM/< ICD-10 Diagnosis Description 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 Modifier: ed Quantity: 1 : CPT4 Code: [id number] Description: [id number] - DEBRIDE WOUND 1ST 20 SQ CM OR < ICD-10 Diagnosis Description L97.522  Non-pressure chronic ulcer of other part of left foot with fat layer exposed I89.0 Lymphedema, not elsewhere classified Modifier: Quantity: 1 Physician Procedures : CPT4 Code Description Modifier [id number] - WC PHYS LEVEL 4 - EST PT 25 ICD-10 Diagnosis Description [id number] Non-pressure chronic ulcer of other part of right lower leg with fat layer exposed L97.522 Non-pressure chronic ulcer of other part of left  foot with fat layer exposed 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.0 Lymphedema, not elsewhere classified LOUI, ROLNICK ([id number]) [phone] Quantity: 1 7.pdf Page 16 of 16 : [id number] 11042 - WC PHYS SUBQ TISS 20 SQ CM 1 ICD-10 Diagnosis Description 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 Quantity: : [id number] - WC PHYS DEBR WO ANESTH 20 SQ CM 1 ICD-10 Diagnosis Description L97.522 Non-pressure chronic ulcer of other part of left foot with fat layer exposed I89.0 Lymphedema, not elsewhere classified Quantity: Electronic Signature(s) Signed: 10/20/2022 1:32:11 PM By: Fredirick Maudlin MD FACS Entered By: Fredirick Maudlin on 10/20/2022 13:32:10

## 2022-10-27 ENCOUNTER — Encounter (HOSPITAL_BASED_OUTPATIENT_CLINIC_OR_DEPARTMENT_OTHER): Payer: Medicare Other | Attending: General Surgery | Admitting: General Surgery

## 2022-10-27 DIAGNOSIS — M5136 Other intervertebral disc degeneration, lumbar region: Secondary | ICD-10-CM | POA: Diagnosis not present

## 2022-10-27 DIAGNOSIS — E785 Hyperlipidemia, unspecified: Secondary | ICD-10-CM | POA: Diagnosis not present

## 2022-10-27 DIAGNOSIS — L97812 Non-pressure chronic ulcer of other part of right lower leg with fat layer exposed: Secondary | ICD-10-CM | POA: Insufficient documentation

## 2022-10-27 DIAGNOSIS — I10 Essential (primary) hypertension: Secondary | ICD-10-CM | POA: Insufficient documentation

## 2022-10-27 DIAGNOSIS — I890 Lymphedema, not elsewhere classified: Secondary | ICD-10-CM | POA: Insufficient documentation

## 2022-10-27 DIAGNOSIS — Z7901 Long term (current) use of anticoagulants: Secondary | ICD-10-CM | POA: Diagnosis not present

## 2022-10-27 DIAGNOSIS — M609 Myositis, unspecified: Secondary | ICD-10-CM | POA: Diagnosis not present

## 2022-10-27 DIAGNOSIS — Z981 Arthrodesis status: Secondary | ICD-10-CM | POA: Diagnosis not present

## 2022-10-27 DIAGNOSIS — Z86718 Personal history of other venous thrombosis and embolism: Secondary | ICD-10-CM | POA: Insufficient documentation

## 2022-10-27 DIAGNOSIS — H9190 Unspecified hearing loss, unspecified ear: Secondary | ICD-10-CM | POA: Diagnosis not present

## 2022-10-27 DIAGNOSIS — I872 Venous insufficiency (chronic) (peripheral): Secondary | ICD-10-CM | POA: Insufficient documentation

## 2022-10-27 DIAGNOSIS — L97522 Non-pressure chronic ulcer of other part of left foot with fat layer exposed: Secondary | ICD-10-CM | POA: Diagnosis not present

## 2022-10-27 DIAGNOSIS — L942 Calcinosis cutis: Secondary | ICD-10-CM | POA: Diagnosis not present

## 2022-10-27 DIAGNOSIS — Z87891 Personal history of nicotine dependence: Secondary | ICD-10-CM | POA: Diagnosis not present

## 2022-10-27 DIAGNOSIS — M4800 Spinal stenosis, site unspecified: Secondary | ICD-10-CM | POA: Diagnosis not present

## 2022-10-27 DIAGNOSIS — D539 Nutritional anemia, unspecified: Secondary | ICD-10-CM | POA: Diagnosis not present

## 2022-10-27 DIAGNOSIS — I2510 Atherosclerotic heart disease of native coronary artery without angina pectoris: Secondary | ICD-10-CM | POA: Insufficient documentation

## 2022-10-27 DIAGNOSIS — Z6837 Body mass index (BMI) 37.0-37.9, adult: Secondary | ICD-10-CM | POA: Diagnosis not present

## 2022-10-27 DIAGNOSIS — Z955 Presence of coronary angioplasty implant and graft: Secondary | ICD-10-CM | POA: Diagnosis not present

## 2022-10-27 DIAGNOSIS — E669 Obesity, unspecified: Secondary | ICD-10-CM | POA: Diagnosis not present

## 2022-10-27 DIAGNOSIS — M5382 Other specified dorsopathies, cervical region: Secondary | ICD-10-CM | POA: Diagnosis not present

## 2022-10-28 NOTE — Progress Notes (Signed)
[name] ([id number]) [id number] Page 1 of 16 Visit Report for 10/27/2022 Chief Complaint Document Details Patient Name: Date of Service: [name], [name] 10/27/2022 12:30 PM Medical Record Number: [id number] Patient Account Number: [id number] Date of Birth/Sex: Treating RN: Oct 09, 1950 (72 y.o. M) Primary Care Provider: PA [name], NO Other Clinician: Referring Provider: Treating Provider/Extender: [name] in Treatment: 37 Information Obtained from: Patient Chief Complaint Patient seen for complaints of Non-Healing Wounds. Electronic Signature(s) Signed: 10/27/2022 1:16:05 PM By: Fredirick Maudlin MD FACS Entered By: Fredirick Maudlin on 10/27/2022 13:16:04 -------------------------------------------------------------------------------- Debridement Details Patient Name: Date of Service: [name], [name] 10/27/2022 12:30 PM Medical Record Number: [id number] Patient Account Number: [id number] Date of Birth/Sex: Treating RN: [birth year] (72 y.o. [name] Primary Care Provider: PA [name], [place] Other Clinician: Referring Provider: Treating Provider/Extender: [name] in Treatment: 37 Debridement Performed for Assessment: Wound #1 Left,Dorsal Foot Performed By: Physician Fredirick Maudlin, MD Debridement Type: Debridement Level of Consciousness (Pre-procedure): Awake and Alert Pre-procedure Verification/Time Out Yes - 13:05 Taken: Start Time: 13:06 Pain Control: Lidocaine 4% T opical Solution T Area Debrided (L x W): otal 0.5 (cm) x 0.5 (cm) = 0.25 (cm) Tissue and other material debrided: Non-Viable, Eschar Level: Non-Viable Tissue Debridement Description: Selective/Open Wound Instrument: Curette Bleeding: Minimum Hemostasis Achieved: Pressure Procedural Pain: 0 Post Procedural Pain: 0 Response to Treatment: Procedure was tolerated well Level of Consciousness (Post- Awake and Alert procedure): Post  Debridement Measurements of Total Wound Length: (cm) 0.2 Width: (cm) 0.2 Depth: (cm) 0.1 Volume: (cm) 0.003 Character of Wound/Ulcer Post Debridement: Improved Post Procedure Diagnosis Same as Pre-procedure Notes Scribed for Dr. [name] by Baruch Gouty, RN Electronic Signature(s) Signed: 10/27/2022 1:40:07 PM By: Fredirick Maudlin MD FACS Signed: 10/27/2022 6:19:38 PM By: Baruch Gouty RN, BSN Entered By: Baruch Gouty on 10/27/2022 13:09:29 [name] ([id number]) [id number] Page 2 of 16 -------------------------------------------------------------------------------- Debridement Details Patient Name: Date of Service: [name], [name] 10/27/2022 12:30 PM Medical Record Number: [id number] Patient Account Number: [id number] Date of Birth/Sex: Treating RN: 02-12-1951 (72 y.o. [name] Primary Care Provider: PA [name], [place] Other Clinician: Referring Provider: Treating Provider/Extender: [name] in Treatment: 37 Debridement Performed for Assessment: Wound #3 Right,Medial Lower Leg Performed By: Physician Fredirick Maudlin, MD Debridement Type: Debridement Level of Consciousness (Pre-procedure): Awake and Alert Pre-procedure Verification/Time Out Yes - 13:05 Taken: Start Time: 13:06 Pain Control: Lidocaine 4% T opical Solution T Area Debrided (L x W): otal 4 (cm) x 4 (cm) = 16 (cm) Tissue and other material debrided: Viable, Non-Viable, Slough, Subcutaneous, Slough Level: Skin/Subcutaneous Tissue Debridement Description: Excisional Instrument: Curette Bleeding: Minimum Hemostasis Achieved: Pressure Procedural Pain: 0 Post Procedural Pain: 0 Response to Treatment: Procedure was tolerated well Level of Consciousness (Post- Awake and Alert procedure): Post Debridement Measurements of Total Wound Length: (cm) 6 Width: (cm) 5.8 Depth: (cm) 0.6 Volume: (cm) 16.399 Character of Wound/Ulcer Post Debridement:  Improved Post Procedure Diagnosis Same as Pre-procedure Notes Scribed for Dr. [name] by Baruch Gouty, RN Electronic Signature(s) Signed: 10/27/2022 1:40:07 PM By: Fredirick Maudlin MD FACS Signed: 10/27/2022 6:19:38 PM By: Baruch Gouty RN, BSN Entered By: Baruch Gouty on 10/27/2022 13:11:46 -------------------------------------------------------------------------------- Debridement Details Patient Name: Date of Service: [name], [name] 10/27/2022 12:30 PM Medical Record Number: [id number] Patient Account Number: [id number] Date of Birth/Sex: Treating RN: [birth year] (72 y.o. [name] Primary Care Provider: PA [name], [place] Other Clinician: Referring Provider: Treating Provider/Extender: [name] in Treatment: 37 Debridement Performed for  Assessment: Wound #6 Right,Anterior Lower Leg Performed By: Physician Fredirick Maudlin, MD Debridement Type: Debridement Level of Consciousness (Pre-procedure): Awake and Alert Pre-procedure Verification/Time Out Yes - 13:05 Taken: Start Time: 13:06 Pain Control: Lidocaine 4% T opical Solution T Area Debrided (L x W): otal 0.8 (cm) x 0.6 (cm) = 0.48 (cm) Tissue and other material debrided: Non-Viable, Slough, Subcutaneous, Slough Level: Skin/Subcutaneous Tissue Debridement Description: Excisional Instrument: Curette Bleeding: Minimum [name], [name] ([id number]) [id number] Page 3 of 16 Hemostasis Achieved: Pressure Procedural Pain: 0 Post Procedural Pain: 0 Response to Treatment: Procedure was tolerated well Level of Consciousness (Post- Awake and Alert procedure): Post Debridement Measurements of Total Wound Length: (cm) 0.8 Width: (cm) 0.6 Depth: (cm) 0.4 Volume: (cm) 0.151 Character of Wound/Ulcer Post Debridement: Improved Post Procedure Diagnosis Same as Pre-procedure Notes Scribed for Dr. [name] by Baruch Gouty, RN Electronic Signature(s) Signed: 10/27/2022 1:40:07 PM  By: Fredirick Maudlin MD FACS Signed: 10/27/2022 6:19:38 PM By: Baruch Gouty RN, BSN Entered By: Baruch Gouty on 10/27/2022 13:13:13 -------------------------------------------------------------------------------- HPI Details Patient Name: Date of Service: [name], [name] 10/27/2022 12:30 PM Medical Record Number: [id number] Patient Account Number: [id number] Date of Birth/Sex: Treating RN: 1950-09-13 (72 y.o. M) Primary Care Provider: PA [name], [place] Other Clinician: Referring Provider: Treating Provider/Extender: [name] in Treatment: 37 History of Present Illness HPI Description: ADMISSION 02/09/2022 This is a 72 year old man who recently moved to [place] from [place]. He has a history of of coronary artery disease and prior left popliteal vein DVT . He is not diabetic and does not smoke. 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 He has had multiple spinal surgeries and has limited mobility. He primarily uses a motorized wheelchair. He has had lymphedema for many years and does have lymphedema pumps although he says he does not use them frequently. He has wounds on his bilateral lower extremities. He was receiving care in [place] for these prior to his move. They were apparently packing his wounds with Betadine soaked gauze. He has worn compression wraps in the past but not recently. He did say that they helped tremendously. In clinic today, his right ABI is noncompressible but has good signals; the left ABI was 1.17. No formal vascular studies have been performed. On his left dorsal foot, there is a circular lesion that exposes the fat layer. There is fairly heavy slough accumulation within the wound, but no significant odor. On his right medial calf, he has multiple pinholes that have slough buildup in them and probe for about 2 to 4 mm in depth. They are draining clear fluid. On his right lateral midfoot, he has ulceration consistent with  venous stasis. It is geographic and has slough accumulation. 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 02/17/2022: All of the wounds are little bit larger today and have more slough accumulation. Today, the medial calf openings are larger and probe to something that feels calcified. There is odor coming from his wounds. His vascular studies are scheduled for August 9 and 10. 02-24-2022 upon evaluation today patient presents for follow-up concerning his ongoing issues here with his lower extremities. With that being said he does have significant problems with what appears to be cellulitis unfortunately the PCR culture that was sent last week got crushed by [employer] in transit. With that being said we are going to reobtain a culture today although I am actually to do it on  the right medial leg as this seems to be the most inflamed area today where there is some questionable drainage as well. I am going to remove some of the calcium deposits which are loosening obtain a deeper culture from this area. Fortunately I do not see any evidence of active infection systemically which is good news but locally I think we are having a bigger issue here. He does have his appointment next Thursday with vascular. Patient has also been referred to [facility] for further evaluation and treatment of the calcinosis for possible sulfate injections. 03/04/2022: The culture that was performed last week grew out multiple species including Klebsiella, Morganella, and Pseudomonas aeruginosa. He had been prescribed Bactrim but this was not adequate to cover all of the species and levofloxacin was recommended. He completed the Bactrim but did not pick up the levofloxacin and begin taking it until yesterday. 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 He had arterial studies performed today. He does have evidence of peripheral arterial disease. Results copied here: +-------+-----------+-----------+------------+------------+ ABI/TBIT oday's ABIT oday's TBIPrevious ABIPrevious TBI +-------+-----------+-----------+------------+------------+ Right [place] 0.75    +-------+-----------+-----------+------------+------------+ Left [place] 0.57    +-------+-----------+-----------+------------+------------+ [name] ([id number]) [id number] Page 4 of 16 Arterial wall calcification precludes accurate ankle pressures and ABIs. Summary: Right: Resting right ankle-brachial index indicates noncompressible right lower extremity arteries. The right toe-brachial index is normal. Left: Resting left ankle-brachial index indicates noncompressible left lower extremity arteries. The left toe-brachial index is abnormal. He continues to have further tissue breakdown at the right medial calf and there is ample slough accumulation along with necrotic subcutaneous tissue. The left dorsal foot wound has built up slough, as well. The right medial foot wound is nearly closed with just a light layer of eschar over the surface. The right dorsal lateral foot wound has also accumulated a fair amount of slough and remains quite tender. 03/10/2022: He has been taking the prescribed levofloxacin and has about 3 days left of this. The erythema and induration on his right leg has improved. He continues to experience further breakdown of the right medial calf wound. There is extensive slough and debris accumulation there. There is heavy slough on his left dorsal foot wound, but no odor or purulent drainage. The right medial foot wound appears to be closed. The right dorsal lateral foot wound also has a fair amount of slough but it is less tender than at our last visit. He  still does not have an appointment with dermatology. 03/22/2022: The right anterior tibial wound has closed. The right lateral foot wound is nearly closed with just a little bit of slough. The left dorsal foot wound is smaller and continues to have some slough buildup but less so than at prior visits. There is good granulation tissue underlying the slough. Unfortunately the right medial calf wound continues to deteriorate. It has a foul odor today and a lot of necrotic tissue, the surrounding skin is also erythematous and moist. 03/30/2022: The right lateral foot wound continues to contract and just has a little bit of slough and eschar present. The left dorsal foot wound is also smaller with slough overlying good granulation tissue. The right medial calf wound actually looks quite a bit better today; there is no odor and there is much less necrotic tissue although some remains present. We have been using topical gentamicin and he  has been taking oral Augmentin. 04/07/2022: The patient saw dermatology at [facility] last week. Unfortunately, it appears that they did not read any of the documentation that was provided. 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 Overall, it was a highly unsatisfactory consultation. In the meantime, however, we were able to identify compounding pharmacy who could create a topical sodium thiosulfate ointment. The patient has that with him today. Both of the right foot wounds are now closed. The left dorsal foot wound has contracted but still has a layer of slough on the surface. The medial calf wounds on the right all look a little bit better. They still have heavy slough along with the chunks of calcium.  Everything is stained purple. 04/14/2022: The wound on his dorsal left foot is a little bit smaller again today. There is still slough accumulation on the surface. The medial calf wounds have quite a bit less slough accumulation today. His right leg, however, is warm and erythematous, concerning for cellulitis. 04/14/2022: He completed his course of Augmentin yesterday. The medial calf wound sites are much cleaner today and shallower. There is less fragmented calcium in the wounds. He has a lot of lymphedema fluid-like drainage from these wounds, but no purulent discharge or malodor. The wound on his dorsal left foot has also contracted and is shallower. There is a layer of slough over healthy granulation tissue. 05/05/2022: The left dorsal foot wound is smaller today with less slough and more granulation tissue. The right medial calf wounds are shallower, but have extensive slough and some fat necrosis. The drainage has diminished considerably, however. 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 05/12/2022: The left dorsal foot wound continues to contract and fill with granulation tissue. There is slough on the wound surface. The right medial calf wounds also continue to fill with healthy tissue, but there is remaining slough and fat necrosis present. He had a significant increase in his drainage this week and it was a bright yellow-green color. He also had a new wound open over his right anterior tibial surface associated with the spike of cutaneous calcium. The leg is more red, as well, concerning for infection. 05/19/2022: His culture returned positive for Pseudomonas. He is currently taking a course of oral levofloxacin. We have also been using topical gentamicin mixed in with his sodium thiosulfate. All of the wounds look better this week. The ones associated with his calcinosis  cutis have filled in substantially. There is slough and nonviable subcutaneous tissue still present. The new anterior tibial wound just has a light layer of slough. The dorsal foot wound also has some slough present and appears a little dry today. 05/26/2022: The right medial leg wound continues to improve. It is feeling with granulation tissue, but still accumulates a fairly thick layer of slough on the surface. The more recent anterior tibial wound has remained quite small, but also has some slough on the surface. The left dorsal foot wound has contracted somewhat and has perimeter epithelialization. He completed his oral levofloxacin. 06/02/2022: The left dorsal foot wound continues to improve significantly. There is just a little slough on the wound surface.  The right medial leg wound had black drainage, but it looks like silver alginate was applied last week and I theorized that silver sulfide was created with a combination of the silver alginate and the sodium thiosulfate. It did, however, have a bit of an odor to it and extensive slough accumulation. The small anterior tibial wound also had a bit of slough and nonviable subcutaneous tissue present. The skin of his leg was rather macerated, as well. 06/09/2022: The left dorsal foot wound is smaller again this week with just a light layer of slough on the surface. The right medial leg wound looks substantially better. The leg and periwound skin are no longer red and macerated. There is good granulation tissue forming with less slough and nonviable tissue. The anterior tibial wound just has a small amount of slough accumulation. 06/16/2022: The left dorsal foot wound continues to contract. His wrap slipped a little bit, however, and his foot is more swollen. The right medial leg wound continues to improve. The underlying tissue is more vital-appearing and there is less slough. He does have substantial drainage. The small anterior tibial wound has a  bit of slough accumulation. 06/23/2022: The left dorsal foot wound is about half the size as it was last week. There is just some light slough on the surface and some eschar buildup around the edges. The right anterior leg wound is small with a little slough on the surface. Unfortunately, the right medial leg wound deteriorated fairly substantially. It is malodorous with gray/green/black discoloration. 06/30/2022: The left dorsal foot wound continues to contract. It is nearly closed with just a light layer of slough present. The right anterior leg wound is about the same size and has a small amount of slough on the surface. The right medial leg wound looks a lot better than it did last week. The odor has abated and the tissue is less pulpy and necrotic-looking. There is still a fairly thick layer of slough present. 12/13; the left dorsal foot wound looks very healthy. There was no need to change the dressing here and no debridement. He has a small area on the right anterior lower leg apparently had not changed much in size. The area on the medial leg is the most problematic area this is large with some depth and a completely nonviable surface today. 07/14/2022: The left dorsal foot wound is smaller today with just a little slough and eschar present. The right anterior lower leg wound is about the same without any significant change. The medial leg wound has a black and green surface and is malodorous. No purulent drainage and the periwound skin is intact. Edema control is suboptimal. 07/21/2022: The dorsal foot wound continues to contract and is nearly closed with just a little bit of slough and eschar on the surface. The right anterior lower leg wound is shallower today but otherwise the dimensions are unchanged. The medial leg wound looks significantly better although it still has a nonviable SLATER, GETTLER ([id number]) [id number] Page 5 of 16 surface; the odor and  black/green surface has resolved. His culture came back with multiple species sensitive to Augmentin, which was prescribed and a very low level of Pseudomonas, which should be handled by the topical gentamicin we have been using. 07/28/2022: The dorsal foot wound is down to just a very tiny opening with a little eschar on the surface. The right anterior tibial wound is about the same with some slough buildup. The right medial leg wound looks quite a bit  better this week. There is thick rubbery slough on the surface, but the odor and drainage has improved significantly. 08/04/2022: The dorsal foot wound is almost healed, but the periwound skin has broken down. This appears to be secondary to moisture and adhesive from the absorbent pad. The right anterior tibial wound is shallower. The right medial leg wound continues to improve. It is smaller, still with rubbery slough on the surface. No malodor or purulent drainage. 08/11/2022: The dorsal foot wound is about the same size. It is a little bit dry with some slough accumulation. The right anterior tibial wound is also unchanged. The right medial leg wound is filling in further with good granulation tissue. Still with some slough buildup on the surface. 08/18/2022: His left leg is bright red, hot, and swollen with cellulitis. The dorsal foot wound actually looks quite good and is superficial with just a little bit of slough accumulation. The right anterior tibial wound is unchanged. The right medial leg wound has filled in with good granulation tissue but has copious amounts of drainage. 08/25/2022: His cellulitis has responded nicely to the oral antibiotics. His dorsal foot wound is smaller but has a fair amount of slough buildup. The right anterior tibial wound remains stable and unchanged. The right medial leg wound has more granulation tissue under a layer of slough, but continues to have copious drainage. The drainage is actually causing skin irritation  and threatens to result in breakdown. 09/01/2022: He continues to have profuse drainage from his wounds which is resulting in tissue maceration on both the dorsum of his left foot and the periwound distal to his medial leg wound. The dorsal foot wound is nearly healed, despite this. The medial right leg wound is filling in with good granulation tissue. We are working on getting home health assistance to change his dressings more frequently to try and control the drainage better. 09/08/2022: We have had success in controlling his drainage. The tissue maceration has improved significantly and his swelling has come down quite markedly. The medial right leg wound is much more superficial and has substantially less nonviable tissue present. The anterior tibial wound is a little bit larger, however with some nonviable tissue present. 09/15/2022: The dorsal foot wound is nearly closed. The anterior tibial wound measures slightly larger today. The medial right leg wound continues to improve quite dramatically with less nonviable tissue and more robust-looking granulation. Edema control is significantly better. 09/22/2022: The left dorsal foot wound remains open, albeit quite tiny. The anterior tibial wound is a little bit larger again today. The medial right leg wound continues to improve with an increase in robust and healthy granulation tissue and less accumulation of nonviable tissue. 09/29/2022: His home health providers wrapped his left leg extremely tightly and he ended up having to cut the wrap off of his foot. This resulted in his foot being very swollen and that the dorsal foot wound is a little bit larger today. The anterior tibial wound is about the same size with a little slough on the surface. The medial right leg wound continues to fill in. There are very few areas with any significant depth. There is still slough accumulation. 3/13; Patient has a left dorsal foot wound along with a right anterior  tibial wound and right medial wound. We are using silver alginate to the left dorsal foot wound, Santyl and sodium thiosulfate compound ointment to the right lower extremity wounds under 4 layer compression. Patient has no issues or complaints today. 10/13/2022:  The left dorsal foot wound is a little bit bigger today, but his foot is also a little bit more swollen. The right medial leg wound has filled in more and has substantially less depth. There is still some nonviable tissue present. The small anterior lower leg wound looks about the same. 10/20/2022: The left dorsal foot wound is epithelializing. There does not appear to be much open at this site. There is some eschar around the edges. The right medial leg wound continues to fill in with granulation tissue. The small anterior lower leg wound is a little bit bigger and there is a chunk of calcium protruding. He continues to have fairly copious drainage from the right leg. 10/27/2022: His home health agency failed to show up at all this past week and as a result, the excessive drainage from his right leg wound has resulted in tissue breakdown. His leg is red and irritated. The wounds themselves look about the same. The wound on his left dorsal foot is nearly closed underneath some eschar. Electronic Signature(s) Signed: 10/27/2022 1:17:27 PM By: Fredirick Maudlin MD FACS Entered By: Fredirick Maudlin on 10/27/2022 13:17:26 -------------------------------------------------------------------------------- Physical Exam Details Patient Name: Date of Service: [name], [name] 10/27/2022 12:30 PM Medical Record Number: [id number] Patient Account Number: [id number] Date of Birth/Sex: Treating RN: 11-Aug-1950 (72 y.o. M) Primary Care Provider: PA [name], NO Other Clinician: Referring Provider: Treating Provider/Extender: [name], [name] in Treatment: 48 Constitutional Slightly hypertensive. . . . no acute distress. Respiratory Normal work  of breathing on room air. Notes 10/27/2022: The excessive drainage from his right leg wound has resulted in tissue breakdown. His leg is red and irritated. The wounds themselves look about the same. The wound on his left dorsal foot is nearly closed underneath some eschar. Electronic Signature(s) Signed: 10/27/2022 1:19:55 PM By: Fredirick Maudlin MD [place], [name] ([id number]) [id number] Page 6 of 16 Signed: 10/27/2022 1:19:55 PM By: Fredirick Maudlin MD FACS Entered By: Fredirick Maudlin on 10/27/2022 13:19:55 -------------------------------------------------------------------------------- Physician Orders Details Patient Name: Date of Service: [name], [name] 10/27/2022 12:30 PM Medical Record Number: [id number] Patient Account Number: [id number] Date of Birth/Sex: Treating RN: Feb 13, 1951 (72 y.o. 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 - Dr. [name] Rm 1 Return A Wednesday 4/10 @ 12:30 pm Anesthetic Wound #1 Left,Dorsal Foot (In clinic) Topical Lidocaine 4% applied to wound bed Wound #3 Right,Medial Lower Leg (In clinic) Topical Lidocaine 4% applied to wound bed Wound #6 Right,Anterior Lower Leg (In clinic) Topical Lidocaine 4% applied  to wound bed Bathing/ Shower/ Hygiene May shower with protection but do not get wound dressing(s) wet. Protect dressing(s) with water repellant cover (for example, large plastic bag) or a cast cover and may then take shower. Edema Control - Lymphedema / SCD / Other Lymphedema Pumps. Use Lymphedema pumps on leg(s) 2-3 times a day for 45-60 minutes. If wearing any wraps or hose, do not remove them. Continue  exercising as instructed. - use 1-2 times per day over wraps Elevate legs to the level of the heart or above for 30 minutes daily and/or when sitting for 3-4 times a day throughout the day. - throughout the day Avoid standing for long periods of time. Exercise regularly Home Health No change in wound care orders this week; continue Home Health for wound care. May utilize formulary equivalent dressing for wound treatment orders unless otherwise specified. Dressing changes to be completed by [facility] on Monday / Wednesday / Friday except when patient has scheduled visit at [facility]. - pt seen in clinic on Wednesday, home health to see on Monday and Friday Other [facility] Orders/Instructions: - [facility] Wound Treatment Wound #1 - Foot Wound Laterality: Dorsal, Left Peri-Wound Care: Zinc Oxide Ointment 30g tube 1 x Per Week/30 Days Discharge Instructions: Apply Zinc Oxide to periwound with each dressing change as needed for excoriation Peri-Wound Care: Sween Lotion (Moisturizing lotion) 1 x Per Week/30 Days Discharge Instructions: Apply moisturizing lotion as directed Prim Dressing: Sorbalgon AG Dressing, 4x4 (in/in) (Generic) 1 x Per Week/30 Days ary Discharge Instructions: or equivalent silver alginate. Apply to wound bed as instructed Secondary Dressing: Woven Gauze Sponge, Non-Sterile 4x4 in (Generic) 1 x Per Week/30 Days Discharge Instructions: Apply over primary dressing as directed. Compression Wrap: FourPress (4 layer compression wrap) (Generic) 1 x Per Week/30  Days NAIEM, EVERIST ([id number]) [phone] Page 7 of 16 Discharge Instructions: Apply four layer compression as directed. May also use Urgo K2 compression system as alternative. Wound #3 - Lower Leg Wound Laterality: Right, Medial Peri-Wound Care: Triamcinolone 15 (g) 3 x Per Week/30 Days Discharge Instructions: Use triamcinolone 15 (g) as directed Peri-Wound Care: Zinc Oxide Ointment 30g tube 3 x Per Week/30 Days Discharge Instructions: Apply Zinc Oxide to excoriated periwound with each dressing change as needed Peri-Wound Care: Sween Lotion (Moisturizing lotion) 3 x Per Week/30 Days Discharge Instructions: Apply moisturizing lotion as directed Prim Dressing: sodium thiosulfate ointment 3 x Per Week/30 Days ary Discharge Instructions: 1:1 mix with santyl to wound bed Secondary Dressing: ABD Pad, 8x10 (Generic) 3 x Per Week/30 Days Discharge Instructions: Apply over primary dressing as directed. Secondary Dressing: Drawtex 4x4 in 3 x Per Week/30 Days Discharge Instructions: Apply over primary dressing as directed. Secondary Dressing: Woven Gauze Sponge, Non-Sterile 4x4 in (Generic) 3 x Per Week/30 Days Discharge Instructions: Apply over primary dressing as directed. Secondary Dressing: Zetuvit Plus 4x8 in 3 x Per Week/30 Days Discharge Instructions: Apply over primary dressing as directed. Compression Wrap: FourPress (4 layer compression wrap) (Generic) 3 x Per Week/30 Days Discharge Instructions: Apply four layer compression as directed. May also use Urgo K2 compression system as alternative. Wound #6 - Lower Leg Wound Laterality: Right, Anterior Peri-Wound Care: Triamcinolone 15 (g) 3 x Per Week/30 Days Discharge Instructions: Use triamcinolone 15 (g) ( in clinic) Peri-Wound Care: Zinc Oxide Ointment 30g tube 3 x Per Week/30 Days Discharge Instructions: Apply Zinc Oxide to periwound with each dressing change as needed for excoriation Peri-Wound Care: Sween  Lotion (Moisturizing lotion) 3 x Per Week/30 Days Discharge Instructions: Apply moisturizing lotion as directed Prim Dressing: sodium thiosulfate ointment 3 x Per Week/30 Days ary Discharge Instructions: to wound bed with each dressing change Secondary Dressing: ABD Pad, 5x9 (Generic) 3 x Per Week/30 Days Discharge Instructions: Apply over primary dressing as directed. Secondary Dressing: Woven Gauze Sponge, Non-Sterile 4x4 in (Generic) 3 x Per Week/30 Days Discharge Instructions: Apply over primary dressing as directed. Compression Wrap:  FourPress (4 layer compression wrap) (Generic) 3 x Per Week/30 Days Discharge Instructions: Apply four layer compression as directed. May also use Urgo K2 compression system as alternative. Electronic Signature(s) Signed: 10/27/2022 1:40:07 PM By: Fredirick Maudlin MD FACS Entered By: Fredirick Maudlin on 10/27/2022 13:20:09 -------------------------------------------------------------------------------- Problem List Details Patient Name: Date of Service: [name], [name] 10/27/2022 12:30 PM Medical Record Number: [id number] Patient Account Number: [id number] Date of Birth/Sex: Treating RN: 10-27-1950 (72 y.o. 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 02/09/2022 No Yes 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) 02/09/2022 No Yes I10 Essential (primary) hypertension 02/09/2022 No Yes I25.10 Atherosclerotic  heart disease of native coronary artery without angina pectoris 02/09/2022 No Yes I89.0 Lymphedema, not elsewhere classified 02/09/2022 No Yes Z86.718 Personal history of other venous thrombosis and embolism 02/09/2022 No Yes Inactive Problems ICD-10 Code Description Active Date Inactive Date L97.512 Non-pressure chronic ulcer of other part of right foot with fat layer exposed 02/17/2022 02/17/2022 Resolved Problems Electronic Signature(s) Signed: 10/27/2022 1:15:51 PM By: Fredirick Maudlin MD FACS Entered By: Fredirick Maudlin on 10/27/2022 13:15:51 -------------------------------------------------------------------------------- Progress Note Details Patient Name: Date of Service: [name] 10/27/2022 12:30 PM Medical Record Number: [id number] Patient Account Number: [id number] Date of Birth/Sex: Treating RN: 20-Jan-1951 (72 y.o. M) Primary Care Provider: PA [name], [place] Other Clinician: Referring Provider: Treating Provider/Extender: [name] in Treatment: 37 Subjective Chief Complaint Information obtained from Patient Patient seen for complaints of Non-Healing Wounds. History of Present Illness (HPI) ADMISSION 02/09/2022 This is a 72 year old man who recently moved to [place] from [place]. He has a history of of coronary artery disease and prior left popliteal vein DVT . He is not diabetic and does not smoke. 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 He has had multiple spinal surgeries and has limited mobility. He primarily uses a motorized wheelchair. He has had lymphedema for many years and does have lymphedema pumps although he says he does not use them frequently. He has wounds on his bilateral lower extremities. He was receiving care in [place] for these prior to his move. They were apparently packing his wounds with Betadine soaked gauze. He has worn compression wraps in the past but not recently. He did say that they helped tremendously.  In clinic today, his right ABI is noncompressible but has good signals; the left ABI was 1.17. No formal vascular studies have been performed. BRALLAN, DRATH ([id number]) [id number] Page 9 of 16 On his left dorsal foot, there is a circular lesion that exposes the fat layer. There is fairly heavy slough accumulation within the wound, but no significant odor. On his right medial calf, he has multiple pinholes that have slough buildup in them and probe for about 2 to 4 mm in depth. They are draining clear fluid. On his right lateral midfoot, he has ulceration consistent with venous stasis. It is geographic and has slough accumulation. 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 02/17/2022: All of the wounds  are little bit larger today and have more slough accumulation. Today, the medial calf openings are larger and probe to something that feels calcified. There is odor coming from his wounds. His vascular studies are scheduled for August 9 and 10. 02-24-2022 upon evaluation today patient presents for follow-up concerning his ongoing issues here with his lower extremities. With that being said he does have significant problems with what appears to be cellulitis unfortunately the PCR culture that was sent last week got crushed by UPS in transit. With that being said we are going to reobtain a culture today although I am actually to do it on the right medial leg as this seems to be the most inflamed area today where there is some questionable drainage as well. I am going to remove some of the calcium deposits which are loosening obtain a deeper culture from this area. Fortunately I do not see any evidence of active infection systemically which is good news but locally I think we are having a bigger issue here. He does have his appointment next Thursday with vascular. Patient has also been referred  to [facility] for further evaluation and treatment of the calcinosis for possible sulfate injections. 03/04/2022: The culture that was performed last week grew out multiple species including Klebsiella, Morganella, and Pseudomonas aeruginosa. He had been prescribed Bactrim but this was not adequate to cover all of the species and levofloxacin was recommended. He completed the Bactrim but did not pick up the levofloxacin and begin taking it until yesterday. 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 He had arterial studies performed today. He does have evidence of peripheral arterial disease. Results copied here: +-------+-----------+-----------+------------+------------+ ABI/TBIT oday's ABIT oday's TBIPrevious ABIPrevious TBI +-------+-----------+-----------+------------+------------+ Right Davison 0.75    +-------+-----------+-----------+------------+------------+ Left Laurel 0.57    +-------+-----------+-----------+------------+------------+ Arterial wall calcification precludes accurate ankle pressures and ABIs. Summary: Right: Resting right ankle-brachial index indicates noncompressible right lower extremity arteries. The right toe-brachial index is normal. Left: Resting left ankle-brachial index indicates noncompressible left lower extremity arteries. The left toe-brachial index is abnormal. He continues to have further tissue breakdown at the right medial calf and there is ample slough accumulation along with necrotic subcutaneous tissue. The left dorsal foot wound has built up slough, as well. The right medial foot wound is nearly closed with just a light layer of eschar over the surface. The right dorsal lateral foot wound has also accumulated a fair amount of slough and remains quite tender. 03/10/2022: He has  been taking the prescribed levofloxacin and has about 3 days left of this. The erythema and induration on his right leg has improved. He continues to experience further breakdown of the right medial calf wound. There is extensive slough and debris accumulation there. There is heavy slough on his left dorsal foot wound, but no odor or purulent drainage. The right medial foot wound appears to be closed. The right dorsal lateral foot wound also has a fair amount of slough but it is less tender than at our last visit. He still does not have an appointment with dermatology. 03/22/2022: The right anterior tibial wound has closed. The right lateral foot wound is nearly closed with just a little bit of slough. The left dorsal foot wound is smaller and continues to have some slough buildup but less so than at prior visits. There is good granulation tissue underlying  the slough. Unfortunately the right medial calf wound continues to deteriorate. It has a foul odor today and a lot of necrotic tissue, the surrounding skin is also erythematous and moist. 03/30/2022: The right lateral foot wound continues to contract and just has a little bit of slough and eschar present. The left dorsal foot wound is also smaller with slough overlying good granulation tissue. The right medial calf wound actually looks quite a bit better today; there is no odor and there is much less necrotic tissue although some remains present. We have been using topical gentamicin and he has been taking oral Augmentin. 04/07/2022: The patient saw dermatology at [facility] last week. Unfortunately, it appears that they did not read any of the documentation that was provided. 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 Overall, it was a highly unsatisfactory consultation. In the meantime, however, we were able to identify compounding pharmacy who could create a topical sodium thiosulfate ointment. The patient has that with him today. Both of the right foot wounds are now closed. The left dorsal foot wound has contracted but still has a layer of slough on the surface. The medial calf wounds on the right all look a little bit better. They still have heavy slough along with the chunks of calcium. Everything is stained purple. 04/14/2022: The wound on his dorsal left foot is a little bit smaller again today. There is still slough accumulation on the surface. The medial calf wounds have quite a bit less slough accumulation today. His right leg, however, is warm and erythematous, concerning for cellulitis. 04/14/2022: He completed his course of Augmentin yesterday. The medial calf wound sites are much cleaner today and shallower. There is less fragmented calcium in the wounds. He has a lot of lymphedema fluid-like drainage from these wounds, but no purulent discharge or malodor. The wound on his dorsal left foot has also contracted and is shallower. There is a layer of slough over healthy granulation tissue. 05/05/2022: The left dorsal foot wound is smaller today with less slough and more granulation tissue. The right medial calf wounds are shallower, but have extensive slough and some fat necrosis. The drainage has diminished considerably, however. 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 05/12/2022: The left dorsal foot wound continues to contract and fill with granulation tissue. There is slough on the wound surface. The right medial calf wounds also continue to fill with healthy tissue, but there is remaining slough and fat necrosis present. He had a significant  increase in his drainage this week and it was a bright yellow-green color. He also had a new wound open over his right anterior tibial surface associated with the spike of cutaneous calcium. The leg is more red, as well, concerning for infection. 05/19/2022: His culture returned positive for Pseudomonas. He is currently taking a course of oral levofloxacin. We have also been using topical gentamicin mixed in with his sodium thiosulfate. All of the wounds look better this week. 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 There is slough and nonviable subcutaneous tissue still present. The new anterior tibial wound just has a light  layer of slough. The dorsal foot wound also has some slough present and appears a little dry today. 05/26/2022: The right medial leg wound continues to improve. It is feeling with granulation tissue, but still accumulates a fairly thick layer of slough on the surface. The more recent anterior tibial wound has remained quite small, but also has some slough on the surface. The left dorsal foot wound has contracted JASE, VOLKOV ([id number]) [id number] Page 10 of 16 somewhat and has perimeter epithelialization. He completed his oral levofloxacin. 06/02/2022: The left dorsal foot wound continues to improve significantly. There is just a little slough on the wound surface. The right medial leg wound had black drainage, but it looks like silver alginate was applied last week and I theorized that silver sulfide was created with a combination of the silver alginate and the sodium thiosulfate. It did, however, have a bit of an odor to it and extensive slough accumulation. The small anterior tibial wound also had a bit of slough and nonviable subcutaneous tissue present. The skin of his leg was rather macerated, as well. 06/09/2022: The left dorsal foot wound is smaller again this week with just a light layer of slough on the  surface. The right medial leg wound looks substantially better. The leg and periwound skin are no longer red and macerated. There is good granulation tissue forming with less slough and nonviable tissue. The anterior tibial wound just has a small amount of slough accumulation. 06/16/2022: The left dorsal foot wound continues to contract. His wrap slipped a little bit, however, and his foot is more swollen. The right medial leg wound continues to improve. The underlying tissue is more vital-appearing and there is less slough. He does have substantial drainage. The small anterior tibial wound has a bit of slough accumulation. 06/23/2022: The left dorsal foot wound is about half the size as it was last week. There is just some light slough on the surface and some eschar buildup around the edges. The right anterior leg wound is small with a little slough on the surface. Unfortunately, the right medial leg wound deteriorated fairly substantially. It is malodorous with gray/green/black discoloration. 06/30/2022: The left dorsal foot wound continues to contract. It is nearly closed with just a light layer of slough present. The right anterior leg wound is about the same size and has a small amount of slough on the surface. The right medial leg wound looks a lot better than it did last week. The odor has abated and the tissue is less pulpy and necrotic-looking. There is still a fairly thick layer of slough present. 12/13; the left dorsal foot wound looks very healthy. There was no need to change the dressing here and no debridement. He has a small area on the right anterior lower leg apparently had not changed much in size. The area on the medial leg is the most problematic area this is large with some depth and a completely nonviable surface today. 07/14/2022: The left dorsal foot wound is smaller today with just a little slough and eschar present. The right anterior lower leg wound is about the same  without any significant change. The medial leg wound has a black and green surface and is malodorous. No purulent drainage and the periwound skin is intact. Edema control is suboptimal. 07/21/2022: The dorsal foot wound continues to contract and is nearly closed with just a little bit of slough and eschar on the surface. The right anterior lower leg wound is shallower today but  otherwise the dimensions are unchanged. The medial leg wound looks significantly better although it still has a nonviable surface; the odor and black/green surface has resolved. His culture came back with multiple species sensitive to Augmentin, which was prescribed and a very low level of Pseudomonas, which should be handled by the topical gentamicin we have been using. 07/28/2022: The dorsal foot wound is down to just a very tiny opening with a little eschar on the surface. The right anterior tibial wound is about the same with some slough buildup. The right medial leg wound looks quite a bit better this week. There is thick rubbery slough on the surface, but the odor and drainage has improved significantly. 08/04/2022: The dorsal foot wound is almost healed, but the periwound skin has broken down. This appears to be secondary to moisture and adhesive from the absorbent pad. The right anterior tibial wound is shallower. The right medial leg wound continues to improve. It is smaller, still with rubbery slough on the surface. No malodor or purulent drainage. 08/11/2022: The dorsal foot wound is about the same size. It is a little bit dry with some slough accumulation. The right anterior tibial wound is also unchanged. The right medial leg wound is filling in further with good granulation tissue. Still with some slough buildup on the surface. 08/18/2022: His left leg is bright red, hot, and swollen with cellulitis. The dorsal foot wound actually looks quite good and is superficial with just a little bit of slough accumulation. The  right anterior tibial wound is unchanged. The right medial leg wound has filled in with good granulation tissue but has copious amounts of drainage. 08/25/2022: His cellulitis has responded nicely to the oral antibiotics. His dorsal foot wound is smaller but has a fair amount of slough buildup. The right anterior tibial wound remains stable and unchanged. The right medial leg wound has more granulation tissue under a layer of slough, but continues to have copious drainage. The drainage is actually causing skin irritation and threatens to result in breakdown. 09/01/2022: He continues to have profuse drainage from his wounds which is resulting in tissue maceration on both the dorsum of his left foot and the periwound distal to his medial leg wound. The dorsal foot wound is nearly healed, despite this. The medial right leg wound is filling in with good granulation tissue. We are working on getting home health assistance to change his dressings more frequently to try and control the drainage better. 09/08/2022: We have had success in controlling his drainage. The tissue maceration has improved significantly and his swelling has come down quite markedly. The medial right leg wound is much more superficial and has substantially less nonviable tissue present. The anterior tibial wound is a little bit larger, however with some nonviable tissue present. 09/15/2022: The dorsal foot wound is nearly closed. The anterior tibial wound measures slightly larger today. The medial right leg wound continues to improve quite dramatically with less nonviable tissue and more robust-looking granulation. Edema control is significantly better. 09/22/2022: The left dorsal foot wound remains open, albeit quite tiny. The anterior tibial wound is a little bit larger again today. The medial right leg wound continues to improve with an increase in robust and healthy granulation tissue and less accumulation of nonviable tissue. 09/29/2022:  His home health providers wrapped his left leg extremely tightly and he ended up having to cut the wrap off of his foot. This resulted in his foot being very swollen and that the dorsal foot  wound is a little bit larger today. The anterior tibial wound is about the same size with a little slough on the surface. The medial right leg wound continues to fill in. There are very few areas with any significant depth. There is still slough accumulation. 3/13; Patient has a left dorsal foot wound along with a right anterior tibial wound and right medial wound. We are using silver alginate to the left dorsal foot wound, Santyl and sodium thiosulfate compound ointment to the right lower extremity wounds under 4 layer compression. Patient has no issues or complaints today. 10/13/2022: The left dorsal foot wound is a little bit bigger today, but his foot is also a little bit more swollen. The right medial leg wound has filled in more and has substantially less depth. There is still some nonviable tissue present. The small anterior lower leg wound looks about the same. 10/20/2022: The left dorsal foot wound is epithelializing. There does not appear to be much open at this site. There is some eschar around the edges. The right medial leg wound continues to fill in with granulation tissue. The small anterior lower leg wound is a little bit bigger and there is a chunk of calcium protruding. He continues to have fairly copious drainage from the right leg. 10/27/2022: His home health agency failed to show up at all this past week and as a result, the excessive drainage from his right leg wound has resulted in tissue breakdown. His leg is red and irritated. The wounds themselves look about the same. The wound on his left dorsal foot is nearly closed underneath some eschar. Patient History Information obtained from Patient, Chart. ALVIS, LUBA ([id number]) [id number] Page 11 of 16 Family  History Cancer - Mother, Heart Disease - Father,Mother, Hypertension - Father,Siblings, Lung Disease - Siblings, No family history of Diabetes, Hereditary Spherocytosis, Kidney Disease, Seizures, Stroke, Thyroid Problems, Tuberculosis. Social History Former smoker - quit 1991, Marital Status - Married, Alcohol Use - Moderate, Drug Use - No History, Caffeine Use - Daily - coffee. Medical History Eyes Patient has history of Cataracts - right eye removed Denies history of Glaucoma, Optic Neuritis Ear/Nose/Mouth/Throat Denies history of Chronic sinus problems/congestion, Middle ear problems Hematologic/Lymphatic Patient has history of Anemia - macrocytic, Lymphedema Cardiovascular Patient has history of Congestive Heart Failure, Deep Vein Thrombosis - left leg, Hypertension, Peripheral Venous Disease Endocrine Denies history of Type I Diabetes, Type II Diabetes Genitourinary Denies history of End Stage Renal Disease Integumentary (Skin) Denies history of History of Burn Oncologic Denies history of Received Chemotherapy, Received Radiation Psychiatric Denies history of Anorexia/bulimia, Confinement Anxiety Hospitalization/Surgery History - cervical fusion. - lumbar surgery. - coronary stent placement. - lasik eye surgery. - hydrocele excision. Medical A Surgical History Notes nd Constitutional Symptoms (General Health) obesity Ear/Nose/Mouth/Throat hard of hearing Respiratory frozen diaphragm right Cardiovascular hyperlipidemia Musculoskeletal myositis, lumbar DDD, spinal stenosis, cervical facet joint syndrome Objective Constitutional Slightly hypertensive. no acute distress. Vitals Time Taken: 12:41 PM, Height: 71 in, Weight: 267 lbs, BMI: 37.2, Temperature: 98 F, Pulse: 60 bpm, Respiratory Rate: 20 breaths/min, Blood Pressure: 149/79 mmHg. Respiratory Normal work of breathing on room air. General Notes: 10/27/2022: The excessive drainage from his right leg wound has  resulted in tissue breakdown. His leg is red and irritated. The wounds themselves look about the same. The wound on his left dorsal foot is nearly closed underneath some eschar. Integumentary (Hair, Skin) Wound #1 status is Open. Original cause of wound was Gradually Appeared. The date acquired was: 12/07/2021.  The wound has been in treatment 37 weeks. The wound is located on the Left,Dorsal Foot. The wound measures 0.1cm length x 0.1cm width x 0.1cm depth; 0.008cm^2 area and 0.001cm^3 volume. There is Fat Layer (Subcutaneous Tissue) exposed. There is no tunneling or undermining noted. There is a small amount of serous drainage noted. The wound margin is distinct with the outline attached to the wound base. There is no granulation within the wound bed. There is a large (67-100%) amount of necrotic tissue within the wound bed including Eschar. The periwound skin appearance had no abnormalities noted for texture. The periwound skin appearance exhibited: Dry/Scaly, Hemosiderin Staining. The periwound skin appearance did not exhibit: Maceration, Rubor, Erythema. Periwound temperature was noted as No Abnormality. The periwound has tenderness on palpation. Wound #3 status is Open. Original cause of wound was Gradually Appeared. The date acquired was: 12/07/2021. The wound has been in treatment 37 weeks. The wound is located on the Right,Medial Lower Leg. The wound measures 6cm length x 5.8cm width x 0.6cm depth; 27.332cm^2 area and 16.399cm^3 volume. There is Fat Layer (Subcutaneous Tissue) exposed. There is no tunneling or undermining noted. There is a large amount of serosanguineous drainage noted. The wound margin is distinct with the outline attached to the wound base. There is large (67-100%) red granulation within the wound bed. There is a small (1-33%) amount of necrotic tissue within the wound bed. The periwound skin appearance exhibited: Excoriation, Hemosiderin Staining, Erythema. The periwound  skin appearance did not exhibit: Dry/Scaly, Maceration, Rubor. The surrounding wound skin color is noted with erythema. Periwound temperature was noted as No Abnormality. The periwound has tenderness on palpation. Wound #6 status is Open. Original cause of wound was Gradually Appeared. The date acquired was: 05/12/2022. The wound has been in treatment 24 weeks. The wound is located on the Right,Anterior Lower Leg. The wound measures 0.8cm length x 0.6cm width x 0.4cm depth; 0.377cm^2 area and 0.151cm^3 volume. There is Fat Layer (Subcutaneous Tissue) exposed. There is no tunneling or undermining noted. There is a medium amount of serosanguineous drainage noted. The wound margin is epibole. There is small (1-33%) granulation within the wound bed. There is a large (67-100%) amount of necrotic tissue within the wound bed including Adherent Slough. The periwound skin appearance had no abnormalities noted for texture. The periwound skin appearance had no abnormalities noted for moisture. The periwound skin appearance exhibited: Hemosiderin Staining. The periwound skin appearance did not exhibit: Atrophie Blanche, Cyanosis, Ecchymosis, Mottled, Pallor, Rubor, Erythema. Periwound temperature was noted as No Abnormality. The periwound has tenderness on [name], GLISAN ([id number]) [phone].pdf Page 12 of 16 palpation. 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  There was a Selective/Open Wound Non-Viable Tissue Debridement with a total area of 0.25 sq cm performed by Fredirick Maudlin, MD. With the following instrument(s): Curette to remove Non-Viable tissue/material. Material removed includes Eschar after achieving pain control using Lidocaine 4% T opical Solution. No specimens were taken. A time out was conducted at 13:05, prior to the start of the procedure. A Minimum amount of bleeding was controlled with Pressure.  The procedure was tolerated well with a pain level of 0 throughout and a pain level of 0 following the procedure. Post Debridement Measurements: 0.2cm length x 0.2cm width x 0.1cm depth; 0.003cm^3 volume. Character of Wound/Ulcer Post Debridement is improved. Post procedure Diagnosis Wound #1: Same as Pre-Procedure General Notes: Scribed for Dr. [name] by Baruch Gouty, RN. Pre-procedure diagnosis of Wound #1 is a Lymphedema located on the Left,Dorsal Foot . There was a Double Layer Compression Therapy Procedure by Baruch Gouty, RN. Post procedure Diagnosis Wound #1: Same as Pre-Procedure Notes: Urgo. Wound #3 Pre-procedure diagnosis of Wound #3 is a Lymphedema located on the Right,Medial Lower Leg . There was a Excisional Skin/Subcutaneous Tissue Debridement with a total area of 16 sq cm performed by Fredirick Maudlin, MD. With the following instrument(s): Curette to remove Viable and Non-Viable tissue/material. Material removed includes Subcutaneous Tissue and Slough and after achieving pain control using Lidocaine 4% T opical Solution. No specimens were taken. A time out was conducted at 13:05, prior to the start of the procedure. A Minimum amount of bleeding was controlled with Pressure. The procedure was tolerated well with a pain level of 0 throughout and a pain level of 0 following the procedure. Post Debridement Measurements: 6cm length x 5.8cm width x 0.6cm depth; 16.399cm^3 volume. Character of Wound/Ulcer Post Debridement is  improved. Post procedure Diagnosis Wound #3: Same as Pre-Procedure General Notes: Scribed for Dr. [name] by Baruch Gouty, RN. Wound #6 Pre-procedure diagnosis of Wound #6 is a Calcinosis located on the Right,Anterior Lower Leg . There was a Excisional Skin/Subcutaneous Tissue Debridement with a total area of 0.48 sq cm performed by Fredirick Maudlin, MD. With the following instrument(s): Curette to remove Non-Viable tissue/material. Material removed includes Subcutaneous Tissue and Slough and after achieving pain control using Lidocaine 4% T opical Solution. No specimens were taken. A time out was conducted at 13:05, prior to the start of the procedure. A Minimum amount of bleeding was controlled with Pressure. The procedure was tolerated well with a pain level of 0 throughout and a pain level of 0 following the procedure. Post Debridement Measurements: 0.8cm length x 0.6cm width x 0.4cm depth; 0.151cm^3 volume. Character of Wound/Ulcer Post Debridement is improved. Post procedure Diagnosis Wound #6: Same as Pre-Procedure General Notes: Scribed for Dr. [name] by Baruch Gouty, RN. 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 There was a Double Layer Compression Therapy Procedure by Baruch Gouty, RN. Post procedure Diagnosis Wound #6: Same as Pre-Procedure Notes: Urgo. Plan Follow-up Appointments: Return Appointment in 1 week. - Dr. [name] Rm 1 Wednesday 4/10 @ 12:30 pm Anesthetic: Wound #1 Left,Dorsal Foot: (In clinic) Topical Lidocaine 4% applied to wound bed Wound #3 Right,Medial Lower Leg: (In clinic) Topical Lidocaine 4% applied to wound bed Wound #6 Right,Anterior Lower Leg: (In clinic) Topical Lidocaine 4% applied to wound bed Bathing/ Shower/ Hygiene: May shower with protection but do not get wound dressing(s) wet. Protect dressing(s) with water repellant cover (for example, large plastic bag) or a cast cover and may then take  shower. Edema Control - Lymphedema / SCD / Other: Lymphedema Pumps. Use Lymphedema pumps on leg(s) 2-3 times a day for 45-60 minutes. If wearing any wraps or hose, do not remove them. JAROLD, CACAL ([id number]) [id number] Page 13 of 16 exercising as instructed. - use 1-2 times per day over wraps Elevate legs to the level of the heart or above for 30 minutes daily and/or when sitting for  3-4 times a day throughout the day. - throughout the day Avoid standing for long periods of time. Exercise regularly Home Health: No change in wound care orders this week; continue Home Health for wound care. May utilize formulary equivalent dressing for wound treatment orders unless otherwise specified. Dressing changes to be completed by [facility] on Monday / Wednesday / Friday except when patient has scheduled visit at [facility]. - pt seen in clinic on Wednesday, home health to see on Monday and Friday Other [facility] Orders/Instructions: - [facility] WOUND #1: - Foot Wound Laterality: Dorsal, Left Peri-Wound Care: Zinc Oxide Ointment 30g tube 1 x Per Week/30 Days Discharge Instructions: Apply Zinc Oxide to periwound with each dressing change as needed for excoriation Peri-Wound Care: Sween Lotion (Moisturizing lotion) 1 x Per Week/30 Days Discharge Instructions: Apply moisturizing lotion as directed Prim Dressing: Sorbalgon AG Dressing, 4x4 (in/in) (Generic) 1 x Per Week/30 Days ary Discharge Instructions: or equivalent silver alginate. Apply to wound bed as instructed Secondary Dressing: Woven Gauze Sponge, Non-Sterile 4x4 in (Generic) 1 x Per Week/30 Days Discharge Instructions: Apply over primary dressing as directed. Com pression Wrap: FourPress (4 layer compression wrap) (Generic) 1 x Per Week/30 Days Discharge Instructions: Apply four layer compression as directed. May also use Urgo K2 compression system as alternative. WOUND #3: - Lower Leg Wound  Laterality: Right, Medial Peri-Wound Care: Triamcinolone 15 (g) 3 x Per Week/30 Days Discharge Instructions: Use triamcinolone 15 (g) as directed Peri-Wound Care: Zinc Oxide Ointment 30g tube 3 x Per Week/30 Days Discharge Instructions: Apply Zinc Oxide to excoriated periwound with each dressing change as needed Peri-Wound Care: Sween Lotion (Moisturizing lotion) 3 x Per Week/30 Days Discharge Instructions: Apply moisturizing lotion as directed Prim Dressing: sodium thiosulfate ointment 3 x Per Week/30 Days ary Discharge Instructions: 1:1 mix with santyl to wound bed Secondary Dressing: ABD Pad, 8x10 (Generic) 3 x Per Week/30 Days Discharge Instructions: Apply over primary dressing as directed. Secondary Dressing: Drawtex 4x4 in 3 x Per Week/30 Days Discharge Instructions: Apply over primary dressing as directed. Secondary Dressing: Woven Gauze Sponge, Non-Sterile 4x4 in (Generic) 3 x Per Week/30 Days Discharge Instructions: Apply over primary dressing as directed. Secondary Dressing: Zetuvit Plus 4x8 in 3 x Per Week/30 Days Discharge Instructions: Apply over primary dressing as directed. Com pression Wrap: FourPress (4 layer compression wrap) (Generic) 3 x Per Week/30 Days Discharge Instructions: Apply four layer compression as directed. May also use Urgo K2 compression system as alternative. WOUND #6: - Lower Leg Wound Laterality: Right, Anterior Peri-Wound Care: Triamcinolone 15 (g) 3 x Per Week/30 Days Discharge Instructions: Use triamcinolone 15 (g) ( in clinic) Peri-Wound Care: Zinc Oxide Ointment 30g tube 3 x Per Week/30 Days Discharge Instructions: Apply Zinc Oxide to periwound with each dressing change as needed for excoriation Peri-Wound Care: Sween Lotion (Moisturizing lotion) 3 x Per Week/30 Days Discharge Instructions: Apply moisturizing lotion as directed Prim Dressing: sodium thiosulfate ointment 3 x Per Week/30 Days ary Discharge Instructions: to wound bed with each  dressing change Secondary Dressing: ABD Pad, 5x9 (Generic) 3 x Per Week/30 Days Discharge Instructions: Apply over primary dressing as directed. Secondary Dressing: Woven Gauze Sponge, Non-Sterile 4x4 in (Generic) 3 x Per Week/30 Days Discharge Instructions: Apply over primary dressing as directed. Com pression Wrap: FourPress (4 layer compression wrap) (Generic) 3 x Per Week/30 Days Discharge Instructions: Apply four layer compression as directed. May also use Urgo K2 compression system as alternative. 10/27/2022: The excessive drainage from his right leg  wound has resulted in tissue breakdown. His leg is red and irritated. The wounds themselves look about the same. The wound on his left dorsal foot is nearly closed underneath some eschar. I used a curette to debride slough and subcutaneous tissue from both of the right leg wounds. I debrided eschar off of the left dorsal foot wound. Continue compounded sodium thiosulfate (prescription drug) to right leg wounds, continue silver alginate to left dorsal foot wound. Continue bilateral 4-layer compression. We will contact the patient's home health agency and discuss the issues with his care and the fact that he is being compromised by their lack of services. Follow-up in 1 week. Electronic Signature(s) Signed: 10/27/2022 1:31:55 PM By: Fredirick Maudlin MD FACS Previous Signature: 10/27/2022 1:20:20 PM Version By: Fredirick Maudlin MD FACS Entered By: Fredirick Maudlin on 10/27/2022 13:31:55 -------------------------------------------------------------------------------- HxROS Details Patient Name: Date of Service: [name], [name] 10/27/2022 12:30 PM Medical Record Number: [id number] Patient Account Number: [id number] Date of Birth/Sex: Treating RN: 1950-09-04 (72 y.o. M) Primary Care Provider: PA Darnelle Spangle Other Clinician: Referring Provider: Treating Provider/Extender: [name] in Treatment: [place] ([id number])  [id number] Page 14 of 16 Information Obtained From Patient Chart Constitutional Symptoms (General Health) Medical History: Past Medical History Notes: obesity Eyes Medical History: Positive for: Cataracts - right eye removed Negative for: Glaucoma; Optic Neuritis Ear/Nose/Mouth/Throat Medical History: Negative for: Chronic sinus problems/congestion; Middle ear problems Past Medical History Notes: hard of hearing Hematologic/Lymphatic Medical History: Positive for: Anemia - macrocytic; Lymphedema Respiratory Medical History: Past Medical History Notes: frozen diaphragm right Cardiovascular Medical History: Positive for: Congestive Heart Failure; Deep Vein Thrombosis - left leg; Hypertension; Peripheral Venous Disease Past Medical History Notes: hyperlipidemia Endocrine Medical History: Negative for: Type I Diabetes; Type II Diabetes Genitourinary Medical History: Negative for: End Stage Renal Disease Integumentary (Skin) Medical History: Negative for: History of Burn Musculoskeletal Medical History: Past Medical History Notes: myositis, lumbar DDD, spinal stenosis, cervical facet joint syndrome Oncologic Medical History: Negative for: Received Chemotherapy; Received Radiation Psychiatric Medical History: Negative for: Anorexia/bulimia; Confinement Anxiety HBO Extended History Items Eyes: Cataracts Immunizations Pneumococcal Vaccine: Received Pneumococcal VaccinationJARRYD, CIESLINSKI ([id number]) [id number] Page 15 of 16 Received Pneumococcal Vaccination On or After 60th Birthday: Yes Implantable Devices No devices added Hospitalization / Surgery History Type of Hospitalization/Surgery cervical fusion lumbar surgery coronary stent placement lasik eye surgery hydrocele excision Family and Social History Cancer: Yes - Mother; Diabetes: No; Heart Disease: Yes - Father,Mother; Hereditary  Spherocytosis: No; Hypertension: Yes - Father,Siblings; Kidney Disease: No; Lung Disease: Yes - Siblings; Seizures: No; Stroke: No; Thyroid Problems: No; Tuberculosis: No; Former smoker - quit 1991; Marital Status - Married; Alcohol Use: Moderate; Drug Use: No History; Caffeine Use: Daily - coffee; Financial Concerns: No; Food, Clothing or Shelter Needs: No; Support System Lacking: No; Transportation Concerns: No Electronic Signature(s) Signed: 10/27/2022 1:40:07 PM By: Fredirick Maudlin MD FACS Entered By: Fredirick Maudlin on 10/27/2022 13:17:33 -------------------------------------------------------------------------------- SuperBill Details Patient Name: Date of Service: BURNS, SKLUZACEK 10/27/2022 Medical Record Number: [id number] Patient Account Number: [id number] Date of Birth/Sex: Treating RN: 1951/06/16 (72 y.o. M) Primary Care Provider: PA [name], NO Other Clinician: Referring Provider: Treating Provider/Extender: [name] in Treatment: 37 Diagnosis Coding ICD-10 Codes Code Description [phone] Non-pressure chronic ulcer of other part of right lower leg with fat layer exposed L97.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) I10 Essential (primary) hypertension  I25.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 angina pectoris I89.0 Lymphedema, not elsewhere classified Z86.718 Personal history of other venous thrombosis and embolism Facility Procedures : CPT4 Code: [id number] Description: 11042 - DEB SUBQ TISSUE 20 SQ CM/< ICD-10 Diagnosis Description 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 Modifier: ed Quantity: 1 : CPT4 Code: [id number] Description: [id number] - DEBRIDE WOUND 1ST 20 SQ CM OR < ICD-10 Diagnosis Description L97.522 Non-pressure chronic ulcer of other  part of left foot with fat layer exposed I89.0 Lymphedema, not elsewhere classified Modifier: Quantity: 1 Physician Procedures : CPT4 Code Description Modifier [id number] - WC PHYS LEVEL 4 - EST PT 25 ICD-10 Diagnosis Description [id number] Non-pressure chronic ulcer of other part of right lower leg with fat layer exposed L97.522 Non-pressure chronic ulcer of other part of left  foot with fat layer exposed 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.0 Lymphedema, not elsewhere classified SUJAN, BAZER ([id number] Quantity: 1 7.pdf Page 16 of 16 : [id number] 11042 - WC PHYS SUBQ TISS 20 SQ CM 1 ICD-10 Diagnosis Description [id number] 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 Quantity: : [id number] - WC PHYS DEBR WO ANESTH 20 SQ CM 1 ICD-10 Diagnosis Description L97.522 Non-pressure chronic ulcer of other part of left foot with fat layer exposed 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

## 2022-10-28 NOTE — Progress Notes (Addendum)
JADD, GASIOR ([id number]) [id number] Page 1 of 11 Visit Report for 10/27/2022 Arrival Information Details Patient Name: Date of Service: [name], [name] 10/27/2022 12:30 PM Medical Record Number: [id number] Patient Account Number: [id number] Date of Birth/Sex: Treating RN: 1950/08/03 (72 y.o. [name] Primary Care Fedora Knisely: PA [name], [place] Other Clinician: Referring Rome Echavarria: Treating Tashe Purdon/Extender: [name] in Treatment: 37 Visit Information History Since Last Visit Added or deleted any medications: No Patient Arrived: Wheel Chair Any new allergies or adverse reactions: No Arrival Time: 12:40 Had a fall or experienced change in No Accompanied By: self activities of daily living that may affect Transfer Assistance: None risk of falls: Patient Identification Verified: Yes Signs or symptoms of abuse/neglect since last visito No Secondary Verification Process Completed: Yes Hospitalized since last visit: No Patient Requires Transmission-Based Precautions: No Implantable device outside of the clinic excluding No Patient Has Alerts: Yes cellular tissue based products placed in the center Patient Alerts: R ABI= N/C, TBI= .75 since last visit: L ABI =N/C, TBI = .57 Has Dressing in Place as Prescribed: Yes Has Compression in Place as Prescribed: Yes Pain Present Now: No Electronic Signature(s) Signed: 10/27/2022 6:19:38 PM By: Zenaida Deed RN, BSN Entered By: Zenaida Deed on 10/27/2022 12:41:19 -------------------------------------------------------------------------------- Complex / Palliative Patient Assessment Details Patient Name: Date of Service: [name] 10/27/2022 12:30 PM Medical Record Number: [id number] Patient Account Number: [id number] Date of Birth/Sex: Treating RN: 06-Nov-1950 (72 y.o. Tammy Sours Primary Care Lawanna Cecere: PA Fidela [place] Other Clinician: Referring Avish Torry: Treating Avamae Dehaan/Extender:  [name] in Treatment: 37 Complex Wound Management Criteria Patient has remarkable or complex co-morbidities requiring medications or treatments that extend wound healing times. Examples: Diabetes mellitus with chronic renal failure or end stage renal disease requiring dialysis Advanced or poorly controlled rheumatoid arthritis Diabetes mellitus and end stage chronic obstructive pulmonary disease Active cancer with current chemo- or radiation therapy lymphedema, anemia, CHF, Hx DVT HTN, PVD, spinal stenosis, DDD-Lumbar,cervical facet joint syndrome , Palliative Wound Management Criteria Care Approach Wound Care Plan: Complex Wound Management Electronic Signature(s) Signed: 10/28/2022 4:34:09 PM By: Shawn Stall RN, BSN Signed: 10/29/2022 7:57:18 AM By: Duanne Guess MD FACS Entered By: Shawn Stall on 10/28/2022 16:34:08 -------------------------------------------------------------------------------- Compression Therapy Details Patient Name: Date of Service: [name] 10/27/2022 12:30 PM Medical Record Number: [id number] Patient Account Number: [id number] Date of Birth/Sex: Treating RN: [birth year] (72 y.o. [name] Primary Care Ahamed Hofland: PA [name], [place] Other Clinician: Referring Amalio Loe: Treating Samarie Pinder/Extender: [name] [place], [name] ([id number]) [id number] Page 2 of 11 Weeks in Treatment: 37 Compression Therapy Performed for Wound Assessment: Wound #6 Right,Anterior Lower Leg Performed By: Clinician Zenaida Deed, RN Compression Type: Double Layer Post Procedure Diagnosis Same as Pre-procedure Notes Urgo Electronic Signature(s) Signed: 10/27/2022 6:19:38 PM By: Zenaida Deed RN, BSN Entered By: Zenaida Deed on 10/27/2022 13:08:01 -------------------------------------------------------------------------------- Compression Therapy Details Patient Name: Date of Service: [name]  10/27/2022 12:30 PM Medical Record Number: [id number] Patient Account Number: [id number] Date of Birth/Sex: Treating RN: Aug 30, 1950 (72 y.o. [name] Primary Care Almer Bushey: PA [name], [place] Other Clinician: Referring Leiya Keesey: Treating Newell Frater/Extender: [name] in Treatment: 37 Compression Therapy Performed for Wound Assessment: Wound #1 Left,Dorsal Foot Performed By: Clinician Zenaida Deed, RN Compression Type: Double Layer Post Procedure Diagnosis Same as Pre-procedure Notes Urgo Electronic Signature(s) Signed: 10/27/2022 6:19:38 PM By: Zenaida Deed RN, BSN Entered By: Zenaida Deed on 10/27/2022 13:08:01 -------------------------------------------------------------------------------- Encounter Discharge Information Details Patient Name: Date  of Service: [name] 10/27/2022 12:30 PM Medical Record Number: [id number] Patient Account Number: [id number] Date of Birth/Sex: Treating RN: 1951/06/29 (72 y.o. [name] Primary Care Darrell Hauk: PA [name], [place] Other Clinician: Referring Nazir Hacker: Treating Mina Carlisi/Extender: [name] in Treatment: 37 Encounter Discharge Information Items Post Procedure Vitals Discharge Condition: Stable Temperature (F): 98 Ambulatory Status: Wheelchair Pulse (bpm): 60 Discharge Destination: Home Respiratory Rate (breaths/min): 18 Transportation: Private Auto Blood Pressure (mmHg): 149/79 Accompanied By: self Schedule Follow-up Appointment: Yes Clinical Summary of Care: Patient Declined Electronic Signature(s) Signed: 10/27/2022 6:19:38 PM By: Zenaida Deed RN, BSN Entered By: Zenaida Deed on 10/27/2022 14:09:33 -------------------------------------------------------------------------------- Lower Extremity Assessment Details Patient Name: Date of Service: [name] 10/27/2022 12:30 PM [name] ([id number]) [id number] Page 3 of 11 Medical  Record Number: [id number] Patient Account Number: [id number] Date of Birth/Sex: Treating RN: 1951/07/07 (72 y.o. [name] Primary Care Leylani Duley: PA [name], [place] Other Clinician: Referring Charlita Brian: Treating Jayden Kratochvil/Extender: [name] Weeks in Treatment: 37 Edema Assessment Assessed: [Left: No] [Right: No] Edema: [Left: Yes] [Right: Yes] Calf Left: Right: Point of Measurement: From Medial Instep 40.5 cm 41 cm Ankle Left: Right: Point of Measurement: From Medial Instep 23 cm 26 cm Vascular Assessment Pulses: Dorsalis Pedis Palpable: [Left:No] [Right:No] Electronic Signature(s) Signed: 10/27/2022 6:19:38 PM By: Zenaida Deed RN, BSN Entered By: Zenaida Deed on 10/27/2022 12:54:37 -------------------------------------------------------------------------------- Multi Wound Chart Details Patient Name: Date of Service: [name] 10/27/2022 12:30 PM Medical Record Number: [id number] Patient Account Number: [id number] Date of Birth/Sex: Treating RN: 1950/12/27 (72 y.o. M) Primary Care Marlita Keil: PA [name], NO Other Clinician: Referring Rahiem Schellinger: Treating Kirsten Spearing/Extender: Luvenia Redden, Edwena Felty in Treatment: 37 Vital Signs Height(in): 71 Pulse(bpm): 60 Weight(lbs): 267 Blood Pressure(mmHg): 149/79 Body Mass Index(BMI): 37.2 Temperature(F): 98 Respiratory Rate(breaths/min): 20 [1:Photos:] Left, Dorsal Foot Right, Medial Lower Leg Right, Anterior Lower Leg Wound Location: Gradually Appeared Gradually Appeared Gradually Appeared Wounding Event: Lymphedema Lymphedema Calcinosis Primary Etiology: Cataracts, Anemia, Lymphedema, Cataracts, Anemia, Lymphedema, Cataracts, Anemia, Lymphedema, Comorbid History: Congestive Heart Failure, Deep Vein Congestive Heart Failure, Deep Vein Congestive Heart Failure, Deep Vein Thrombosis, Hypertension, Peripheral Thrombosis, Hypertension, Peripheral Thrombosis, Hypertension, Peripheral Venous  Disease Venous Disease Venous Disease 12/07/2021 12/07/2021 05/12/2022 Date Acquired: 37 37 24 Weeks of Treatment: Open Open Open Wound Status: No No No Wound Recurrence: 0.1x0.1x0.1 6x5.8x0.6 0.8x0.6x0.4 Measurements L x W x D (cm) 0.008 27.332 0.377 A (cm) : rea 0.001 16.399 0.151 Volume (cm) : 99.80% -411.70% -59.70% % Reduction in AreaLINELL, SHAWN ([id number]) [id number] Page 4 of 11 99.90% -667.70% -529.20% % Reduction in Volume: Full Thickness Without Exposed Full Thickness With Exposed Support Full Thickness Without Exposed Classification: Support Structures Structures Support Structures Small Large Medium Exudate A mount: Serous Serosanguineous Serosanguineous Exudate Type: amber red, brown red, brown Exudate Color: Distinct, outline attached Distinct, outline attached Epibole Wound Margin: None Present (0%) Large (67-100%) Small (1-33%) Granulation A mount: N/A Red N/A Granulation Quality: Large (67-100%) Small (1-33%) Large (67-100%) Necrotic A mount: Eschar N/A Adherent Slough Necrotic Tissue: Fat Layer (Subcutaneous Tissue): Yes Fat Layer (Subcutaneous Tissue): Yes Fat Layer (Subcutaneous Tissue): Yes Exposed Structures: Fascia: No Fascia: No Fascia: No Tendon: No Tendon: No Tendon: No Muscle: No Muscle: No Muscle: No Joint: No Joint: No Joint: No Bone: No Bone: No Bone: No Large (67-100%) Small (1-33%) Small (1-33%) Epithelialization: Debridement - Selective/Open Wound Debridement - Excisional Debridement - Excisional Debridement: Pre-procedure Verification/Time Out 13:05 13:05 13:05 Taken: Lidocaine 4% Topical Solution Lidocaine  4% Topical Solution Lidocaine 4% Topical Solution Pain Control: Necrotic/Eschar Subcutaneous, Slough Subcutaneous, Slough Tissue Debrided: Non-Viable Tissue Skin/Subcutaneous Tissue Skin/Subcutaneous Tissue Level: 0.25 16 0.48 Debridement A (sq cm): rea Curette Curette  Curette Instrument: Minimum Minimum Minimum Bleeding: Pressure Pressure Pressure Hemostasis A chieved: 0 0 0 Procedural Pain: 0 0 0 Post Procedural Pain: Procedure was tolerated well Procedure was tolerated well Procedure was tolerated well Debridement Treatment Response: 0.2x0.2x0.1 6x5.8x0.6 0.8x0.6x0.4 Post Debridement Measurements L x W x D (cm) 0.003 16.399 0.151 Post Debridement Volume: (cm) Excoriation: No Excoriation: Yes Excoriation: No Periwound Skin Texture: Induration: No Callus: No Crepitus: No Rash: No Scarring: No Dry/Scaly: Yes Maceration: No Maceration: No Periwound Skin Moisture: Maceration: No Dry/Scaly: No Dry/Scaly: No Hemosiderin Staining: Yes Erythema: Yes Hemosiderin Staining: Yes Periwound Skin Color: Erythema: No Hemosiderin Staining: Yes Atrophie Blanche: No Rubor: No Rubor: No Cyanosis: No Ecchymosis: No Erythema: No Mottled: No Pallor: No Rubor: No N/A Increased N/A Erythema Change: No Abnormality No Abnormality No Abnormality Temperature: Yes Yes Yes Tenderness on Palpation: Compression Therapy Debridement Compression Therapy Procedures Performed: Debridement Debridement Treatment Notes Electronic Signature(s) Signed: 10/27/2022 1:15:57 PM By: Duanne Guess MD FACS Entered By: Duanne Guess on 10/27/2022 13:15:56 -------------------------------------------------------------------------------- Multi-Disciplinary Care Plan Details Patient Name: Date of Service: ZI, [place] 10/27/2022 12:30 PM Medical Record Number: [id number] Patient Account Number: [id number] Date of Birth/Sex: Treating RN: 25-Mar-1951 (72 y.o. [name] Primary Care Inaara Tye: PA [name], [place] Other Clinician: Referring Jaydeen Odor: Treating Bartlomiej Jenkinson/Extender: [name] in Treatment: 37 Multidisciplinary Care Plan reviewed with physician Active Inactive Venous Leg Ulcer LAURICE, KIMMONS ([id number])  [id number] Page 5 of 11 Nursing Diagnoses: Knowledge deficit related to disease process and management Potential for venous Insuffiency (use before diagnosis confirmed) Goals: Patient will maintain optimal edema control Date Initiated: 02/17/2022 Target Resolution Date: 11/24/2022 Goal Status: Active Interventions: Assess peripheral edema status every visit. Compression as ordered Provide education on venous insufficiency Treatment Activities: Non-invasive vascular studies : 02/17/2022 Therapeutic compression applied : 02/17/2022 Notes: Wound/Skin Impairment Nursing Diagnoses: Impaired tissue integrity Knowledge deficit related to ulceration/compromised skin integrity Goals: Patient/caregiver will verbalize understanding of skin care regimen Date Initiated: 02/17/2022 Target Resolution Date: 11/24/2022 Goal Status: Active Ulcer/skin breakdown will have a volume reduction of 30% by week 4 Date Initiated: 02/17/2022 Date Inactivated: 03/10/2022 Target Resolution Date: 03/10/2022 Unmet Reason: infection being treated, Goal Status: Unmet waiting on derm appte Ulcer/skin breakdown will have a volume reduction of 50% by week 8 Date Initiated: 03/10/2022 Date Inactivated: 04/14/2022 Target Resolution Date: 04/07/2022 Goal Status: Unmet Unmet Reason: calcinosis Ulcer/skin breakdown will have a volume reduction of 80% by week 12 Date Initiated: 04/14/2022 Date Inactivated: 05/05/2022 Target Resolution Date: 05/05/2022 Unmet Reason: infection, chronic Goal Status: Unmet condition Interventions: Assess patient/caregiver ability to obtain necessary supplies Assess patient/caregiver ability to perform ulcer/skin care regimen upon admission and as needed Assess ulceration(s) every visit Provide education on ulcer and skin care Treatment Activities: Skin care regimen initiated : 02/17/2022 Topical wound management initiated : 02/17/2022 Notes: Electronic  Signature(s) Signed: 10/27/2022 6:19:38 PM By: Zenaida Deed RN, BSN Entered By: Zenaida Deed on 10/27/2022 13:02:32 -------------------------------------------------------------------------------- Pain Assessment Details Patient Name: Date of Service: TATE, ZAGAL 10/27/2022 12:30 PM Medical Record Number: [id number] Patient Account Number: [id number] Date of Birth/Sex: Treating RN: 10/22/1950 (72 y.o. [name] Primary Care Keionna Kinnaird: PA [name], [place] Other Clinician: Referring Akyia Borelli: Treating Rheta Hemmelgarn/Extender: [name] in Treatment: 37 Active Problems Location of Pain Severity and Description of Pain  Patient Has Paino No Site Locations [place], [place] ([id number]) [id number] Page 6 of 11 Rate the pain. Current Pain Level: 0 Pain Management and Medication Current Pain Management: Electronic Signature(s) Signed: 10/27/2022 6:19:38 PM By: Zenaida Deed RN, BSN Entered By: Zenaida Deed on 10/27/2022 12:41:48 -------------------------------------------------------------------------------- Patient/Caregiver Education Details Patient Name: Date of Service: JEREMI, LOSITO 4/3/2024andnbsp12:30 PM Medical Record Number: [id number] Patient Account Number: [id number] Date of Birth/Gender: Treating RN: 1951-04-23 (72 y.o. [name] Primary Care Physician: PA [name], [place] Other Clinician: Referring Physician: Treating Physician/Extender: [name] in Treatment: 37 Education Assessment Education Provided To: Patient Education Topics Provided Venous: Methods: Explain/Verbal Responses: Reinforcements needed, State content correctly Electronic Signature(s) Signed: 10/27/2022 6:19:38 PM By: Zenaida Deed RN, BSN Entered By: Zenaida Deed on 10/27/2022 13:03:15 -------------------------------------------------------------------------------- Wound Assessment Details Patient Name: Date of  Service: SHINE, MIKES 10/27/2022 12:30 PM Medical Record Number: [id number] Patient Account Number: [id number] Date of Birth/Sex: Treating RN: [birth year] (72 y.o. [name] Primary Care Zeven Kocak: PA [name], [place] Other Clinician: Referring Sydell Prowell: Treating Kayd Launer/Extender: [name] Weeks in Treatment: 37 Wound Status Wound Number: 1 Primary Lymphedema Etiology: Wound Location: Left, Dorsal Foot Wound Open Wounding Event: Gradually Appeared Status: Date Acquired: 12/07/2021 Comorbid Cataracts, Anemia, Lymphedema, Congestive Heart Failure, Deep Weeks Of Treatment: 37 History: Vein Thrombosis, Hypertension, Peripheral Venous Disease Clustered Wound: No Bernardini, Melvin ([id number]) [id number] Page 7 of 11 Photos Wound Measurements Length: (cm) 0.1 Width: (cm) 0.1 Depth: (cm) 0.1 Area: (cm) 0.008 Volume: (cm) 0.001 % Reduction in Area: 99.8% % Reduction in Volume: 99.9% Epithelialization: Large (67-100%) Tunneling: No Undermining: No Wound Description Classification: Full Thickness Without Exposed Suppor Wound Margin: Distinct, outline attached Exudate Amount: Small Exudate Type: Serous Exudate Color: amber t Structures Foul Odor After Cleansing: No Slough/Fibrino Yes Wound Bed Granulation Amount: None Present (0%) Exposed Structure Necrotic Amount: Large (67-100%) Fascia Exposed: No Necrotic Quality: Eschar Fat Layer (Subcutaneous Tissue) Exposed: Yes Tendon Exposed: No Muscle Exposed: No Joint Exposed: No Bone Exposed: No Periwound Skin Texture Texture Color No Abnormalities Noted: Yes No Abnormalities Noted: No Erythema: No Moisture Hemosiderin Staining: Yes No Abnormalities Noted: No Rubor: No Dry / Scaly: Yes Maceration: No Temperature / Pain Temperature: No Abnormality Tenderness on Palpation: Yes Treatment Notes Wound #1 (Foot) Wound Laterality: Dorsal, Left Cleanser Peri-Wound Care Zinc Oxide  Ointment 30g tube Discharge Instruction: Apply Zinc Oxide to periwound with each dressing change as needed for excoriation Sween Lotion (Moisturizing lotion) Discharge Instruction: Apply moisturizing lotion as directed Topical Primary Dressing Sorbalgon AG Dressing, 4x4 (in/in) Discharge Instruction: or equivalent silver alginate. Apply to wound bed as instructed Secondary Dressing Woven Gauze Sponge, Non-Sterile 4x4 in Discharge Instruction: Apply over primary dressing as directed. Secured With Compression Wrap FourPress (4 layer compression wrap) MAYSEN, SUDOL ([id number]) [id number] Page 8 of 11 Discharge Instruction: Apply four layer compression as directed. May also use Urgo K2 compression system as alternative. Compression Stockings Add-Ons Electronic Signature(s) Signed: 10/27/2022 6:19:38 PM By: Zenaida Deed RN, BSN Entered By: Zenaida Deed on 10/27/2022 13:00:12 -------------------------------------------------------------------------------- Wound Assessment Details Patient Name: Date of Service: BRADEY, LUZIER 10/27/2022 12:30 PM Medical Record Number: [id number] Patient Account Number: [id number] Date of Birth/Sex: Treating RN: 1951/03/14 (72 y.o. [name] Primary Care Marek Nghiem: PA [name], [place] Other Clinician: Referring Caridad Silveira: Treating Maysoon Lozada/Extender: [name] Weeks in Treatment: 37 Wound Status Wound Number: 3 Primary Lymphedema Etiology: Wound Location: Right, Medial Lower Leg Wound Open Wounding Event: Gradually Appeared Status: Date Acquired: 12/07/2021  Comorbid Cataracts, Anemia, Lymphedema, Congestive Heart Failure, Deep Weeks Of Treatment: 37 History: Vein Thrombosis, Hypertension, Peripheral Venous Disease Clustered Wound: No Photos Wound Measurements Length: (cm) 6 Width: (cm) 5.8 Depth: (cm) 0.6 Area: (cm) 27.332 Volume: (cm) 16.399 % Reduction in Area: -411.7% % Reduction in Volume:  -667.7% Epithelialization: Small (1-33%) Tunneling: No Undermining: No Wound Description Classification: Full Thickness With Exposed Suppo Wound Margin: Distinct, outline attached Exudate Amount: Large Exudate Type: Serosanguineous Exudate Color: red, brown rt Structures Foul Odor After Cleansing: No Slough/Fibrino Yes Wound Bed Granulation Amount: Large (67-100%) Exposed Structure Granulation Quality: Red Fascia Exposed: No Necrotic Amount: Small (1-33%) Fat Layer (Subcutaneous Tissue) Exposed: Yes Tendon Exposed: No Muscle Exposed: No Joint Exposed: No Bone Exposed: No Periwound Skin Texture Texture Color No Abnormalities Noted: No No Abnormalities Noted: No Excoriation: Yes Erythema: Yes Erythema Change: Increased Moisture Hemosiderin Staining: Yes No Abnormalities Noted: No Rubor: No DONTAVIOUS, DOUYON ([id number]) [id number] Page 9 of 11 Rubor: No Dry / Scaly: No Maceration: No Temperature / Pain Temperature: No Abnormality Tenderness on Palpation: Yes Treatment Notes Wound #3 (Lower Leg) Wound Laterality: Right, Medial Cleanser Peri-Wound Care Triamcinolone 15 (g) Discharge Instruction: Use triamcinolone 15 (g) as directed Zinc Oxide Ointment 30g tube Discharge Instruction: Apply Zinc Oxide to excoriated periwound with each dressing change as needed Sween Lotion (Moisturizing lotion) Discharge Instruction: Apply moisturizing lotion as directed Topical Primary Dressing sodium thiosulfate ointment Discharge Instruction: 1:1 mix with santyl to wound bed Secondary Dressing ABD Pad, 8x10 Discharge Instruction: Apply over primary dressing as directed. Drawtex 4x4 in Discharge Instruction: Apply over primary dressing as directed. Woven Gauze Sponge, Non-Sterile 4x4 in Discharge Instruction: Apply over primary dressing as directed. Zetuvit Plus 4x8 in Discharge Instruction: Apply over primary dressing as directed. Secured  With Compression Wrap FourPress (4 layer compression wrap) Discharge Instruction: Apply four layer compression as directed. May also use Urgo K2 compression system as alternative. Compression Stockings Add-Ons Electronic Signature(s) Signed: 10/27/2022 6:19:38 PM By: Zenaida Deed RN, BSN Entered By: Zenaida Deed on 10/27/2022 13:11:57 -------------------------------------------------------------------------------- Wound Assessment Details Patient Name: Date of Service: KINNIE, LAMBERTI 10/27/2022 12:30 PM Medical Record Number: [id number] Patient Account Number: [id number] Date of Birth/Sex: Treating RN: 03/31/1951 (72 y.o. [name] Primary Care Greydis Stlouis: PA [name], [place] Other Clinician: Referring Marton Malizia: Treating Nasir Bright/Extender: [name] Weeks in Treatment: 37 Wound Status Wound Number: 6 Primary Calcinosis Etiology: Wound Location: Right, Anterior Lower Leg Wound Open Wounding Event: Gradually Appeared Status: Date Acquired: 05/12/2022 Comorbid Cataracts, Anemia, Lymphedema, Congestive Heart Failure, Deep Weeks Of Treatment: 24 History: Vein Thrombosis, Hypertension, Peripheral Venous Disease Clustered Wound: No Photos DAYSEAN, DIGGINS ([id number]) [id number].pdf Page 10 of 11 Wound Measurements Length: (cm) 0.8 Width: (cm) 0.6 Depth: (cm) 0.4 Area: (cm) 0.377 Volume: (cm) 0.151 % Reduction in Area: -59.7% % Reduction in Volume: -529.2% Epithelialization: Small (1-33%) Tunneling: No Undermining: No Wound Description Classification: Full Thickness Without Exposed Suppor Wound Margin: Epibole Exudate Amount: Medium Exudate Type: Serosanguineous Exudate Color: red, brown t Structures Foul Odor After Cleansing: No Slough/Fibrino Yes Wound Bed Granulation Amount: Small (1-33%) Exposed Structure Necrotic Amount: Large (67-100%) Fascia Exposed: No Necrotic Quality: Adherent Slough Fat Layer (Subcutaneous Tissue)  Exposed: Yes Tendon Exposed: No Muscle Exposed: No Joint Exposed: No Bone Exposed: No Periwound Skin Texture Texture Color No Abnormalities Noted: Yes No Abnormalities Noted: No Atrophie Blanche: No Moisture Cyanosis: No No Abnormalities Noted: Yes Ecchymosis: No Erythema: No Hemosiderin Staining: Yes Mottled: No Pallor: No Rubor: No Temperature / Pain Temperature:  No Abnormality Tenderness on Palpation: Yes Treatment Notes Wound #6 (Lower Leg) Wound Laterality: Right, Anterior Cleanser Peri-Wound Care Triamcinolone 15 (g) Discharge Instruction: Use triamcinolone 15 (g) ( in clinic) Zinc Oxide Ointment 30g tube Discharge Instruction: Apply Zinc Oxide to periwound with each dressing change as needed for excoriation Sween Lotion (Moisturizing lotion) Discharge Instruction: Apply moisturizing lotion as directed Topical Primary Dressing sodium thiosulfate ointment Discharge Instruction: to wound bed with each dressing change Secondary Dressing ABD Pad, 5x9 Discharge Instruction: Apply over primary dressing as directed. AKSHAJ, SPARHAWK ([id number]) [id number] Page 11 of 11 Woven Gauze Sponge, Non-Sterile 4x4 in Discharge Instruction: Apply over primary dressing as directed. Secured With Compression Wrap FourPress (4 layer compression wrap) Discharge Instruction: Apply four layer compression as directed. May also use Urgo K2 compression system as alternative. Compression Stockings Add-Ons Electronic Signature(s) Signed: 10/27/2022 6:19:38 PM By: Zenaida Deed RN, BSN Entered By: Zenaida Deed on 10/27/2022 13:02:12 -------------------------------------------------------------------------------- Vitals Details Patient Name: Date of Service: DELONTE, NEDEAU [name] 10/27/2022 12:30 PM Medical Record Number: [id number] Patient Account Number: [id number] Date of Birth/Sex: Treating RN: 11/10/1950 (72 y.o. [name] Primary Care Inda Mcglothen: PA [name],  [place] Other Clinician: Referring Keisi Eckford: Treating Caeson Filippi/Extender: [name] in Treatment: 37 Vital Signs Time Taken: 12:41 Temperature (F): 98 Height (in): 71 Pulse (bpm): 60 Weight (lbs): 267 Respiratory Rate (breaths/min): 20 Body Mass Index (BMI): 37.2 Blood Pressure (mmHg): 149/79 Reference Range: 80 - 120 mg / dl Electronic Signature(s) Signed: 10/27/2022 6:19:38 PM By: Zenaida Deed RN, BSN Entered By: Zenaida Deed on 10/27/2022 12:41:39

## 2022-10-29 DIAGNOSIS — I872 Venous insufficiency (chronic) (peripheral): Secondary | ICD-10-CM | POA: Diagnosis not present

## 2022-10-29 DIAGNOSIS — L97522 Non-pressure chronic ulcer of other part of left foot with fat layer exposed: Secondary | ICD-10-CM | POA: Diagnosis not present

## 2022-10-29 DIAGNOSIS — L97812 Non-pressure chronic ulcer of other part of right lower leg with fat layer exposed: Secondary | ICD-10-CM | POA: Diagnosis not present

## 2022-10-29 DIAGNOSIS — I890 Lymphedema, not elsewhere classified: Secondary | ICD-10-CM | POA: Diagnosis not present

## 2022-10-29 DIAGNOSIS — L942 Calcinosis cutis: Secondary | ICD-10-CM | POA: Diagnosis not present

## 2022-10-29 DIAGNOSIS — I10 Essential (primary) hypertension: Secondary | ICD-10-CM | POA: Diagnosis not present

## 2022-11-01 DIAGNOSIS — L97522 Non-pressure chronic ulcer of other part of left foot with fat layer exposed: Secondary | ICD-10-CM | POA: Diagnosis not present

## 2022-11-01 DIAGNOSIS — I890 Lymphedema, not elsewhere classified: Secondary | ICD-10-CM | POA: Diagnosis not present

## 2022-11-01 DIAGNOSIS — I872 Venous insufficiency (chronic) (peripheral): Secondary | ICD-10-CM | POA: Diagnosis not present

## 2022-11-01 DIAGNOSIS — L97812 Non-pressure chronic ulcer of other part of right lower leg with fat layer exposed: Secondary | ICD-10-CM | POA: Diagnosis not present

## 2022-11-01 DIAGNOSIS — L942 Calcinosis cutis: Secondary | ICD-10-CM | POA: Diagnosis not present

## 2022-11-01 DIAGNOSIS — I10 Essential (primary) hypertension: Secondary | ICD-10-CM | POA: Diagnosis not present

## 2022-11-03 ENCOUNTER — Encounter (HOSPITAL_BASED_OUTPATIENT_CLINIC_OR_DEPARTMENT_OTHER): Payer: Medicare Other | Admitting: General Surgery

## 2022-11-03 DIAGNOSIS — I10 Essential (primary) hypertension: Secondary | ICD-10-CM | POA: Diagnosis not present

## 2022-11-03 DIAGNOSIS — L942 Calcinosis cutis: Secondary | ICD-10-CM | POA: Diagnosis not present

## 2022-11-03 DIAGNOSIS — I890 Lymphedema, not elsewhere classified: Secondary | ICD-10-CM | POA: Diagnosis not present

## 2022-11-03 DIAGNOSIS — I872 Venous insufficiency (chronic) (peripheral): Secondary | ICD-10-CM | POA: Diagnosis not present

## 2022-11-03 DIAGNOSIS — L97522 Non-pressure chronic ulcer of other part of left foot with fat layer exposed: Secondary | ICD-10-CM | POA: Diagnosis not present

## 2022-11-03 DIAGNOSIS — I2510 Atherosclerotic heart disease of native coronary artery without angina pectoris: Secondary | ICD-10-CM | POA: Diagnosis not present

## 2022-11-03 DIAGNOSIS — L97812 Non-pressure chronic ulcer of other part of right lower leg with fat layer exposed: Secondary | ICD-10-CM | POA: Diagnosis not present

## 2022-11-04 NOTE — Progress Notes (Signed)
[name] ([id number]) [id number] Page 1 of 15 Visit Report for 11/03/2022 Chief Complaint Document Details Patient Name: Date of Service: [name], [name] 11/03/2022 12:30 PM Medical Record Number: [id number] Patient Account Number: [id number] Date of Birth/Sex: Treating RN: 1951/02/22 (72 y.o. M) Primary Care Provider: PA [name], NO Other Clinician: Referring Provider: Treating Provider/Extender: [name] in Treatment: 38 Information Obtained from: Patient Chief Complaint Patient seen for complaints of Non-Healing Wounds. Electronic Signature(s) Signed: 11/03/2022 3:14:53 PM By: [name] Guess MD FACS Entered By: [name] on 11/03/2022 15:14:53 -------------------------------------------------------------------------------- Debridement Details Patient Name: Date of Service: [name], [name] 11/03/2022 12:30 PM Medical Record Number: [id number] Patient Account Number: [id number] Date of Birth/Sex: Treating RN: 1950/10/09 (72 y.o. [name] Primary Care Provider: PA [name], [place] Other Clinician: Referring Provider: Treating Provider/Extender: [name] in Treatment: 38 Debridement Performed for Assessment: Wound #3 Right,Medial Lower Leg Performed By: Physician [name] Guess, MD Debridement Type: Debridement Level of Consciousness (Pre-procedure): Awake and Alert Pre-procedure Verification/Time Out Yes - 13:15 Taken: Start Time: 13:15 Pain Control: Lidocaine 4% T opical Solution T Area Debrided (L x W): otal 6 (cm) x 7.2 (cm) = 43.2 (cm) Tissue and other material debrided: Viable, Non-Viable, Slough, Subcutaneous, Slough Level: Skin/Subcutaneous Tissue Debridement Description: Excisional Instrument: Curette Bleeding: Minimum Hemostasis Achieved: Pressure Procedural Pain: 5 Post Procedural Pain: 3 Response to Treatment: Procedure was tolerated well Level of Consciousness (Post-  Awake and Alert procedure): Post Debridement Measurements of Total Wound Length: (cm) 10.3 Width: (cm) 7.2 Depth: (cm) 0.6 Volume: (cm) 34.947 Character of Wound/Ulcer Post Debridement: Requires Further Debridement Post Procedure Diagnosis Same as Pre-procedure Notes Scribed for Dr. [name] by [name] Deed, RN Electronic Signature(s) Signed: 11/03/2022 4:32:21 PM By: [name] Guess MD FACS Signed: 11/03/2022 5:09:22 PM By: [name] Deed RN, BSN Entered By: [name] on 11/03/2022 13:20:18 [name] ([id number]) [id number] Page 2 of 15 -------------------------------------------------------------------------------- Debridement Details Patient Name: Date of Service: [name], [name] 11/03/2022 12:30 PM Medical Record Number: [id number] Patient Account Number: [id number] Date of Birth/Sex: Treating RN: 07-Sep-1950 (72 y.o. [name] Primary Care Provider: PA [name], [place] Other Clinician: Referring Provider: Treating Provider/Extender: [name] in Treatment: 38 Debridement Performed for Assessment: Wound #6 Right,Anterior Lower Leg Performed By: Physician [name] Guess, MD Debridement Type: Debridement Level of Consciousness (Pre-procedure): Awake and Alert Pre-procedure Verification/Time Out Yes - 13:15 Taken: Start Time: 13:15 Pain Control: Lidocaine 4% T opical Solution T Area Debrided (L x W): otal 0.7 (cm) x 0.5 (cm) = 0.35 (cm) Tissue and other material debrided: Non-Viable, Slough, Slough Level: Non-Viable Tissue Debridement Description: Selective/Open Wound Instrument: Curette Bleeding: Minimum Hemostasis Achieved: Pressure Procedural Pain: 2 Post Procedural Pain: 0 Response to Treatment: Procedure was tolerated well Level of Consciousness (Post- Awake and Alert procedure): Post Debridement Measurements of Total Wound Length: (cm) 0.7 Width: (cm) 0.5 Depth: (cm) 0.5 Volume: (cm)  0.137 Character of Wound/Ulcer Post Debridement: Improved Post Procedure Diagnosis Same as Pre-procedure Notes Scribed for Dr. [name] by [name] Deed, RN Electronic Signature(s) Signed: 11/03/2022 4:32:21 PM By: [name] Guess MD FACS Signed: 11/03/2022 5:09:22 PM By: [name] Deed RN, BSN Entered By: [name] on 11/03/2022 13:21:11 -------------------------------------------------------------------------------- HPI Details Patient Name: Date of Service: [name], [name] 11/03/2022 12:30 PM Medical Record Number: [id number] Patient Account Number: [id number] Date of Birth/Sex: Treating RN: 08/19/1950 (72 y.o. M) Primary Care Provider: PA [name], NO Other Clinician: Referring Provider: Treating Provider/Extender: Luvenia Redden, Edwena Felty in Treatment: 38 History  of Present Illness HPI Description: ADMISSION 02/09/2022 This is a 72 year old man who recently moved to [place] from [place]. He has a history of of coronary artery disease and prior left popliteal vein DVT . He is not diabetic and does not smoke. 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 He has had multiple spinal surgeries and has limited mobility. He primarily uses a motorized wheelchair. He has had lymphedema for many years and does have lymphedema pumps although he says he does not use them frequently. He has wounds on his bilateral lower extremities. He was receiving care in [place] for these prior to his move. They were apparently packing his wounds with Betadine soaked gauze. He has worn compression wraps in the past but not recently. He did say that they helped tremendously. In clinic today, his right ABI is noncompressible but has good signals; the left ABI was 1.17. No formal vascular studies have been performed. On his left dorsal foot, there is a circular lesion that exposes the fat layer. There is fairly heavy slough accumulation within the wound, but no significant odor. [name], [name] ([id number]) [id number] Page 3 of 15 On his right medial calf, he has multiple pinholes that have slough buildup in them and probe for about 2 to 4 mm in depth. They are draining clear fluid. On his right lateral midfoot, he has ulceration consistent with venous stasis. It is geographic and has slough accumulation. 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 02/17/2022: All of the wounds are little bit larger today and have more slough accumulation. Today, the medial calf openings are larger and probe to something that feels calcified. There is odor coming from his wounds. His vascular studies are scheduled for August 9 and 10. 02-24-2022 upon evaluation today patient presents for follow-up concerning his ongoing issues here with his lower extremities. With that being said he does have significant problems with what appears to be cellulitis unfortunately the PCR culture that was sent last week got crushed by [employer] in transit. With that being said we are going to reobtain a culture today although I am actually to do it on the right medial leg as this seems to be the most inflamed area today where there is some questionable drainage as well. I am going to remove some of the calcium deposits which are loosening obtain a deeper culture from this area. Fortunately I do not see any evidence of active infection systemically which is good news but locally I think we are having a bigger issue here. He does have his appointment next Thursday with vascular. Patient has also been referred to [facility] for further evaluation and treatment of the calcinosis for possible sulfate injections. 03/04/2022: The culture that was performed last week grew out multiple species including Klebsiella, Morganella, and Pseudomonas aeruginosa. He had been prescribed Bactrim but this was not adequate to cover all  of the species and levofloxacin was recommended. He completed the Bactrim but did not pick up the levofloxacin and begin taking it until yesterday. 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 He had arterial studies performed today. He does have evidence of peripheral arterial disease. Results copied here: +-------+-----------+-----------+------------+------------+ ABI/TBIT oday's ABIT oday's TBIPrevious ABIPrevious TBI +-------+-----------+-----------+------------+------------+ Right Freeborn 0.75    +-------+-----------+-----------+------------+------------+  Left Bement 0.57    +-------+-----------+-----------+------------+------------+ Arterial wall calcification precludes accurate ankle pressures and ABIs. Summary: Right: Resting right ankle-brachial index indicates noncompressible right lower extremity arteries. The right toe-brachial index is normal. Left: Resting left ankle-brachial index indicates noncompressible left lower extremity arteries. The left toe-brachial index is abnormal. He continues to have further tissue breakdown at the right medial calf and there is ample slough accumulation along with necrotic subcutaneous tissue. The left dorsal foot wound has built up slough, as well. The right medial foot wound is nearly closed with just a light layer of eschar over the surface. The right dorsal lateral foot wound has also accumulated a fair amount of slough and remains quite tender. 03/10/2022: He has been taking the prescribed levofloxacin and has about 3 days left of this. The erythema and induration on his right leg has improved. He continues to experience further breakdown of the right medial calf wound. There is extensive slough and debris accumulation there. There is heavy slough on his left dorsal foot  wound, but no odor or purulent drainage. The right medial foot wound appears to be closed. The right dorsal lateral foot wound also has a fair amount of slough but it is less tender than at our last visit. He still does not have an appointment with dermatology. 03/22/2022: The right anterior tibial wound has closed. The right lateral foot wound is nearly closed with just a little bit of slough. The left dorsal foot wound is smaller and continues to have some slough buildup but less so than at prior visits. There is good granulation tissue underlying the slough. Unfortunately the right medial calf wound continues to deteriorate. It has a foul odor today and a lot of necrotic tissue, the surrounding skin is also erythematous and moist. 03/30/2022: The right lateral foot wound continues to contract and just has a little bit of slough and eschar present. The left dorsal foot wound is also smaller with slough overlying good granulation tissue. The right medial calf wound actually looks quite a bit better today; there is no odor and there is much less necrotic tissue although some remains present. We have been using topical gentamicin and he has been taking oral Augmentin. 04/07/2022: The patient saw dermatology at [facility] last week. Unfortunately, it appears that they did not read any of the documentation that was provided. 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 Overall, it was a highly unsatisfactory consultation. In the meantime, however, we were able to identify compounding pharmacy who could create a topical sodium thiosulfate ointment. The patient has that with him today. Both of the right foot wounds are now closed. The left dorsal  foot wound has contracted but still has a layer of slough on the surface. The medial calf wounds on the right all look a little bit better. They still have heavy slough along with the chunks of calcium. Everything is stained purple. 04/14/2022: The wound on his dorsal left foot is a little bit smaller again today. There is still slough accumulation on the surface. The medial calf wounds have quite a bit less slough accumulation today. His right leg, however, is warm and erythematous, concerning for cellulitis. 04/14/2022: He completed his course of Augmentin yesterday. The medial calf wound  sites are much cleaner today and shallower. There is less fragmented calcium in the wounds. He has a lot of lymphedema fluid-like drainage from these wounds, but no purulent discharge or malodor. The wound on his dorsal left foot has also contracted and is shallower. There is a layer of slough over healthy granulation tissue. 05/05/2022: The left dorsal foot wound is smaller today with less slough and more granulation tissue. The right medial calf wounds are shallower, but have extensive slough and some fat necrosis. The drainage has diminished considerably, however. 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 05/12/2022: The left dorsal foot wound continues to contract and fill with granulation tissue. There is slough on the wound surface. The right medial calf wounds also continue to fill with healthy tissue, but there is remaining slough and fat necrosis present. He had a significant increase in his drainage this week and it was a bright yellow-green color. He also had a new wound open over his right anterior tibial surface associated with the spike of cutaneous calcium. The leg is more red, as well, concerning for infection. 05/19/2022: His culture returned positive for Pseudomonas. He is currently  taking a course of oral levofloxacin. We have also been using topical gentamicin mixed in with his sodium thiosulfate. All of the wounds look better this week. 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 There is slough and nonviable subcutaneous tissue still present. The new anterior tibial wound just has a light layer of slough. The dorsal foot wound also has some slough present and appears a little dry today. 05/26/2022: The right medial leg wound continues to improve. It is feeling with granulation tissue, but still accumulates a fairly thick layer of slough on the surface. The more recent anterior tibial wound has remained quite small, but also has some slough on the surface. The left dorsal foot wound has contracted somewhat and has perimeter epithelialization. He completed his oral levofloxacin. [name], [name] ([id number]) [id number] Page 4 of 15 06/02/2022: The left dorsal foot wound continues to improve significantly. There is just a little slough on the wound surface. The right medial leg wound had black drainage, but it looks like silver alginate was applied last week and I theorized that silver sulfide was created with a combination of the silver alginate and the sodium thiosulfate. It did, however, have a bit of an odor to it and extensive slough accumulation. The small anterior tibial wound also had a bit of slough and nonviable subcutaneous tissue present. The skin of his leg was rather macerated, as well. 06/09/2022: The left dorsal foot wound is smaller again this week with just a light layer of slough on the surface. The right medial leg wound looks substantially better. The leg and periwound skin are no longer red and macerated. There is good granulation tissue forming with less slough and nonviable tissue. The anterior tibial wound just has a small amount of slough accumulation. 06/16/2022: The left dorsal foot wound continues  to contract. His wrap slipped a little bit, however, and his foot is more swollen. The right medial leg wound continues to improve. The underlying tissue is more vital-appearing and there is less slough. He does have substantial drainage. The small anterior tibial wound has a bit of slough accumulation. 06/23/2022: The left dorsal foot wound is about half the size as it was last week. There  is just some light slough on the surface and some eschar buildup around the edges. The right anterior leg wound is small with a little slough on the surface. Unfortunately, the right medial leg wound deteriorated fairly substantially. It is malodorous with gray/green/black discoloration. 06/30/2022: The left dorsal foot wound continues to contract. It is nearly closed with just a light layer of slough present. The right anterior leg wound is about the same size and has a small amount of slough on the surface. The right medial leg wound looks a lot better than it did last week. The odor has abated and the tissue is less pulpy and necrotic-looking. There is still a fairly thick layer of slough present. 12/13; the left dorsal foot wound looks very healthy. There was no need to change the dressing here and no debridement. He has a small area on the right anterior lower leg apparently had not changed much in size. The area on the medial leg is the most problematic area this is large with some depth and a completely nonviable surface today. 07/14/2022: The left dorsal foot wound is smaller today with just a little slough and eschar present. The right anterior lower leg wound is about the same without any significant change. The medial leg wound has a black and green surface and is malodorous. No purulent drainage and the periwound skin is intact. Edema control is suboptimal. 07/21/2022: The dorsal foot wound continues to contract and is nearly closed with just a little bit of slough and eschar on the surface. The right  anterior lower leg wound is shallower today but otherwise the dimensions are unchanged. The medial leg wound looks significantly better although it still has a nonviable surface; the odor and black/green surface has resolved. His culture came back with multiple species sensitive to Augmentin, which was prescribed and a very low level of Pseudomonas, which should be handled by the topical gentamicin we have been using. 07/28/2022: The dorsal foot wound is down to just a very tiny opening with a little eschar on the surface. The right anterior tibial wound is about the same with some slough buildup. The right medial leg wound looks quite a bit better this week. There is thick rubbery slough on the surface, but the odor and drainage has improved significantly. 08/04/2022: The dorsal foot wound is almost healed, but the periwound skin has broken down. This appears to be secondary to moisture and adhesive from the absorbent pad. The right anterior tibial wound is shallower. The right medial leg wound continues to improve. It is smaller, still with rubbery slough on the surface. No malodor or purulent drainage. 08/11/2022: The dorsal foot wound is about the same size. It is a little bit dry with some slough accumulation. The right anterior tibial wound is also unchanged. The right medial leg wound is filling in further with good granulation tissue. Still with some slough buildup on the surface. 08/18/2022: His left leg is bright red, hot, and swollen with cellulitis. The dorsal foot wound actually looks quite good and is superficial with just a little bit of slough accumulation. The right anterior tibial wound is unchanged. The right medial leg wound has filled in with good granulation tissue but has copious amounts of drainage. 08/25/2022: His cellulitis has responded nicely to the oral antibiotics. His dorsal foot wound is smaller but has a fair amount of slough buildup. The right anterior tibial wound remains  stable and unchanged. The right medial leg wound has more granulation tissue  under a layer of slough, but continues to have copious drainage. The drainage is actually causing skin irritation and threatens to result in breakdown. 09/01/2022: He continues to have profuse drainage from his wounds which is resulting in tissue maceration on both the dorsum of his left foot and the periwound distal to his medial leg wound. The dorsal foot wound is nearly healed, despite this. The medial right leg wound is filling in with good granulation tissue. We are working on getting home health assistance to change his dressings more frequently to try and control the drainage better. 09/08/2022: We have had success in controlling his drainage. The tissue maceration has improved significantly and his swelling has come down quite markedly. The medial right leg wound is much more superficial and has substantially less nonviable tissue present. The anterior tibial wound is a little bit larger, however with some nonviable tissue present. 09/15/2022: The dorsal foot wound is nearly closed. The anterior tibial wound measures slightly larger today. The medial right leg wound continues to improve quite dramatically with less nonviable tissue and more robust-looking granulation. Edema control is significantly better. 09/22/2022: The left dorsal foot wound remains open, albeit quite tiny. The anterior tibial wound is a little bit larger again today. The medial right leg wound continues to improve with an increase in robust and healthy granulation tissue and less accumulation of nonviable tissue. 09/29/2022: His home health providers wrapped his left leg extremely tightly and he ended up having to cut the wrap off of his foot. This resulted in his foot being very swollen and that the dorsal foot wound is a little bit larger today. The anterior tibial wound is about the same size with a little slough on the surface. The medial right leg  wound continues to fill in. There are very few areas with any significant depth. There is still slough accumulation. 3/13; Patient has a left dorsal foot wound along with a right anterior tibial wound and right medial wound. We are using silver alginate to the left dorsal foot wound, Santyl and sodium thiosulfate compound ointment to the right lower extremity wounds under 4 layer compression. Patient has no issues or complaints today. 10/13/2022: The left dorsal foot wound is a little bit bigger today, but his foot is also a little bit more swollen. The right medial leg wound has filled in more and has substantially less depth. There is still some nonviable tissue present. The small anterior lower leg wound looks about the same. 10/20/2022: The left dorsal foot wound is epithelializing. There does not appear to be much open at this site. There is some eschar around the edges. The right medial leg wound continues to fill in with granulation tissue. The small anterior lower leg wound is a little bit bigger and there is a chunk of calcium protruding. He continues to have fairly copious drainage from the right leg. 10/27/2022: His home health agency failed to show up at all this past week and as a result, the excessive drainage from his right leg wound has resulted in tissue breakdown. His leg is red and irritated. The wounds themselves look about the same. The wound on his left dorsal foot is nearly closed underneath some eschar. 11/03/2022: We continue to have difficulty with his home health agency. He continues to have significant drainage from his right leg wound which has resulted in even further tissue breakdown. He also was not taking his Lasix as prescribed and I think much of the drainage has been  his lymphedema fluid choosing the path of least resistance. The wound on his left dorsal foot is closed, but in the process of cleaning the site, dry skin was pulled off and he has a new small superficial  wound just adjacent to where the dorsal foot wound was. ZVI, DUPLANTIS ([id number]) [id number] Page 5 of 15 Electronic Signature(s) Signed: 11/03/2022 3:15:54 PM By: [name] Guess MD FACS Entered By: [name] on 11/03/2022 15:15:53 -------------------------------------------------------------------------------- Physical Exam Details Patient Name: Date of Service: [name], [name] 11/03/2022 12:30 PM Medical Record Number: [id number] Patient Account Number: [id number] Date of Birth/Sex: Treating RN: 1951-06-02 (72 y.o. M) Primary Care Provider: PA [name], [place] Other Clinician: Referring Provider: Treating Provider/Extender: [name] in Treatment: 38 Constitutional Hypertensive, asymptomatic. . . . no acute distress. Respiratory Normal work of breathing on room air. Notes 11/03/2022: He continues to have further tissue breakdown from the copious drainage coming from his right leg wound. There is a small superficial wound adjacent to where his left dorsal foot wound had been. There is slough and nonviable tissue present on both right leg wounds. Electronic Signature(s) Signed: 11/03/2022 3:18:29 PM By: [name] Guess MD FACS Entered By: [name] on 11/03/2022 15:18:29 -------------------------------------------------------------------------------- Physician Orders Details Patient Name: Date of Service: [name], [name] 11/03/2022 12:30 PM Medical Record Number: [id number] Patient Account Number: [id number] Date of Birth/Sex: Treating RN: October 27, 1950 (72 y.o. 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 - Dr. [name] Rm 1 Return A Friday 4/19 @ 12:30 pm Nurse Visit: - Friday 4/12 @ 2:45 pm RM 2 Tuesday 4/16 @ 0745 am RM 2 Anesthetic Wound #1 Left,Dorsal Foot (In clinic) Topical Lidocaine 4% applied to wound bed Wound #3 Right,Medial Lower Leg (In clinic) Topical Lidocaine 4% applied to wound bed Wound #6 Right,Anterior Lower Leg (In clinic) Topical Lidocaine 4% applied to wound bed [name], QUINNEY ([id number]) [id number] Page 6 of 15 Bathing/ Shower/ Hygiene May shower with protection but do not get wound dressing(s) wet. Protect dressing(s) with water repellant cover (for example, large plastic bag) or a cast cover and may then take shower. Edema Control - Lymphedema / SCD / Other Lymphedema Pumps. Use Lymphedema pumps on leg(s) 2-3 times a day for 45-60 minutes. If wearing any wraps or hose, do not remove them. Continue exercising as instructed. - use 1-2 times per day over wraps Elevate legs to the level of the heart or above for 30 minutes daily and/or when sitting for 3-4 times a day throughout the day. - throughout the day Avoid standing for long periods of time. Exercise regularly Home Health Discontinue home health for wound care. - patient to be seen in wound clinic 2 times per week Other Home Health Orders/Instructions: - [facility] Wound Treatment Wound #1 - Foot Wound Laterality: Dorsal, Left Peri-Wound Care: Zinc Oxide Ointment 30g tube 2 x Per Week/30 Days Discharge Instructions: Apply Zinc Oxide to periwound with each dressing  change as needed  for excoriation Peri-Wound Care: Sween Lotion (Moisturizing lotion) 2 x Per Week/30 Days Discharge Instructions: Apply moisturizing lotion as directed Prim Dressing: Sorbalgon AG Dressing, 4x4 (in/in) (Generic) 2 x Per Week/30 Days ary Discharge Instructions: or equivalent silver alginate. Apply to wound bed as instructed Secondary Dressing: Woven Gauze Sponge, Non-Sterile 4x4 in (Generic) 2 x Per Week/30 Days Discharge Instructions: Apply over primary dressing as directed. Compression Wrap: FourPress (4 layer compression wrap) (Generic) 2 x Per Week/30 Days Discharge Instructions: Apply four layer compression as directed. May also use Urgo K2 compression system as alternative. Wound #3 - Lower Leg Wound Laterality: Right, Medial Peri-Wound Care: Triamcinolone 15 (g) 2 x Per Week/30 Days Discharge Instructions: Use triamcinolone 15 (g) as directed Peri-Wound Care: Zinc Oxide Ointment 30g tube 2 x Per Week/30 Days Discharge Instructions: Apply Zinc Oxide to excoriated periwound with each dressing change as needed Peri-Wound Care: Sween Lotion (Moisturizing lotion) 2 x Per Week/30 Days Discharge Instructions: Apply moisturizing lotion as directed Prim Dressing: sodium thiosulfate ointment 2 x Per Week/30 Days ary Discharge Instructions: 1:1 mix with santyl to wound bed Secondary Dressing: ABD Pad, 8x10 (Generic) 2 x Per Week/30 Days Discharge Instructions: Apply over primary dressing as directed. Secondary Dressing: Drawtex 4x4 in 2 x Per Week/30 Days Discharge Instructions: Apply over primary dressing as directed. Secondary Dressing: Woven Gauze Sponge, Non-Sterile 4x4 in (Generic) 2 x Per Week/30 Days Discharge Instructions: Apply over primary dressing as directed. Secondary Dressing: Zetuvit Plus 4x8 in 2 x Per Week/30 Days Discharge Instructions: Apply over primary dressing as directed. Compression Wrap: FourPress (4 layer compression wrap) (Generic) 2 x Per Week/30 Days Discharge  Instructions: Apply four layer compression as directed. May also use Urgo K2 compression system as alternative. Wound #6 - Lower Leg Wound Laterality: Right, Anterior Peri-Wound Care: Triamcinolone 15 (g) 2 x Per Week/30 Days Discharge Instructions: Use triamcinolone 15 (g) ( in clinic) Peri-Wound Care: Zinc Oxide Ointment 30g tube 2 x Per Week/30 Days Discharge Instructions: Apply Zinc Oxide to periwound with each dressing change as needed for excoriation Peri-Wound Care: Sween Lotion (Moisturizing lotion) 2 x Per Week/30 Days Discharge Instructions: Apply moisturizing lotion as directed Prim Dressing: sodium thiosulfate ointment 2 x Per Week/30 Days ary Discharge Instructions: to wound bed with each dressing change Secondary Dressing: ABD Pad, 5x9 (Generic) 2 x Per Week/30 Days Discharge Instructions: Apply over primary dressing as directed. Secondary Dressing: Woven Gauze Sponge, Non-Sterile 4x4 in (Generic) 2 x Per Week/30 Days GERHART, RUGGIERI ([id number]) [id number] Page 7 of 15 Discharge Instructions: Apply over primary dressing as directed. Compression Wrap: FourPress (4 layer compression wrap) (Generic) 2 x Per Week/30 Days Discharge Instructions: Apply four layer compression as directed. May also use Urgo K2 compression system as alternative. Electronic Signature(s) Signed: 11/03/2022 4:32:21 PM By: [name] Guess MD FACS Entered By: [name] on 11/03/2022 15:18:47 -------------------------------------------------------------------------------- Problem List Details Patient Name: Date of Service: [name], [name] 11/03/2022 12:30 PM Medical Record Number: [id number] Patient Account Number: [id number] Date of Birth/Sex: Treating RN: 1951/04/01 (72 y.o. 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 02/09/2022 No Yes I87.2 Venous  insufficiency (chronic) (peripheral) 02/09/2022 No Yes I10 Essential (primary) hypertension 02/09/2022 No Yes I25.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 angina pectoris 02/09/2022 No Yes I89.0 Lymphedema, not elsewhere classified 02/09/2022 No Yes Z86.718 Personal history of other venous thrombosis and embolism 02/09/2022 No Yes Inactive Problems ICD-10 Code Description Active Date Inactive Date L97.512 Non-pressure chronic ulcer of other part of right foot with fat layer exposed 02/17/2022 02/17/2022 Resolved Problems Electronic Signature(s) IDA, UPPAL ([id number]) [id number] Page 8 of 15 Signed: 11/03/2022 3:14:20 PM By: [name] Guess MD FACS Entered By: [name] on 11/03/2022 15:14:19 -------------------------------------------------------------------------------- Progress Note Details Patient Name: Date of Service: [name], [name] 11/03/2022 12:30 PM Medical Record Number: [id number] Patient Account Number: [id number] Date of Birth/Sex: Treating RN: Jan 05, 1951 (72 y.o. M) Primary Care Provider: PA [name], [place] Other Clinician: Referring Provider: Treating Provider/Extender: [name] in Treatment: 38 Subjective Chief Complaint Information obtained from Patient Patient seen for complaints of Non-Healing Wounds. History of Present Illness (HPI) ADMISSION 02/09/2022 This is a 72 year old man who recently moved to [place] from [place]. He has a history of of coronary artery disease and prior left popliteal vein DVT . He is not diabetic and does not smoke. 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 He has had multiple spinal surgeries and has limited mobility. He primarily uses a motorized wheelchair. He has had lymphedema for many years and does have lymphedema pumps although he says he does not use them frequently. He has wounds on his bilateral lower extremities. He was receiving care in [place] for these prior to his move. They were apparently packing his wounds with Betadine soaked gauze. He has worn compression wraps in the past but not recently. He did say that they helped tremendously. In clinic today, his right ABI is noncompressible but has good signals; the left ABI was 1.17. No formal vascular studies have been performed. On his left dorsal foot, there is a circular lesion that exposes the fat layer. There is fairly heavy slough accumulation within the wound, but no significant odor. On his right medial calf, he has multiple pinholes that have slough buildup in them and probe for about 2 to 4 mm in depth. They are draining clear fluid. On his right lateral midfoot, he has ulceration consistent with venous stasis. It is geographic and has slough accumulation. 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 02/17/2022: All of the wounds are little bit larger today and have more slough accumulation. Today, the medial calf openings are larger and probe to something that feels calcified. There is odor coming from his wounds. His vascular studies are scheduled for August 9 and 10. 02-24-2022 upon evaluation today patient presents for follow-up concerning his ongoing issues here with his lower extremities. With that being said he does have significant problems with what appears to be cellulitis unfortunately the PCR culture that was sent last week got crushed by UPS in transit. With that being said we are going to reobtain a culture today although I am actually to  do it on the right medial leg as this seems to be the most inflamed area today where there is some questionable drainage as well. I am going to remove some of the calcium deposits which are loosening obtain a deeper culture from this area. Fortunately I do not see any evidence of active infection systemically which is good news but  locally I think we are having a bigger issue here. He does have his appointment next Thursday with vascular. Patient has also been referred to [facility] for further evaluation and treatment of the calcinosis for possible sulfate injections. 03/04/2022: The culture that was performed last week grew out multiple species including Klebsiella, Morganella, and Pseudomonas aeruginosa. He had been prescribed Bactrim but this was not adequate to cover all of the species and levofloxacin was recommended. He completed the Bactrim but did not pick up the levofloxacin and begin taking it until yesterday. 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 He had arterial studies performed today. He does have evidence of peripheral arterial disease. Results copied here: +-------+-----------+-----------+------------+------------+ ABI/TBIT oday's ABIT oday's TBIPrevious ABIPrevious TBI +-------+-----------+-----------+------------+------------+ Right [place] 0.75    +-------+-----------+-----------+------------+------------+ Left [place] 0.57    +-------+-----------+-----------+------------+------------+ Arterial wall calcification precludes accurate ankle pressures and ABIs. Summary: Right: Resting right ankle-brachial index indicates noncompressible right lower extremity arteries. The right toe-brachial index is normal. Left: Resting left ankle-brachial index indicates noncompressible left lower extremity  arteries. The left toe-brachial index is abnormal. He continues to have further tissue breakdown at the right medial calf and there is ample slough accumulation along with necrotic subcutaneous tissue. The left dorsal foot wound has built up slough, as well. The right medial foot wound is nearly closed with just a light layer of eschar over the surface. The right dorsal lateral foot wound has also accumulated a fair amount of slough and remains quite tender. 03/10/2022: He has been taking the prescribed levofloxacin and has about 3 days left of this. The erythema and induration on his right leg has improved. He continues to experience further breakdown of the right medial calf wound. There is extensive slough and debris accumulation there. There is heavy slough on his left dorsal foot wound, but no odor or purulent drainage. The right medial foot wound appears to be closed. The right dorsal lateral foot wound also has a fair amount of slough but it is less tender than at our last visit. He still does not have an appointment with dermatology. 03/22/2022: The right anterior tibial wound has closed. The right lateral foot wound is nearly closed with just a little bit of slough. The left dorsal foot wound is smaller and continues to have some slough buildup but less so than at prior visits. There is good granulation tissue underlying the slough. Unfortunately the right medial calf wound continues to deteriorate. It has a foul odor today and a lot of necrotic tissue, the surrounding skin is also erythematous and moist. [name], [name] ([id number]) [phone].pdf Page 9 of 15 03/30/2022: The right lateral foot wound continues to contract and just has a little bit of slough and eschar present. The left dorsal foot wound is also smaller with slough overlying good granulation tissue. The right medial calf wound actually looks quite a bit better today; there is no odor and there is much less  necrotic tissue although some remains present. We have been using topical gentamicin and he has been taking oral Augmentin. 04/07/2022: The patient saw dermatology at [facility] last week. Unfortunately, it appears that they did not read any of the documentation that was provided. 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 Overall, it was a highly unsatisfactory consultation. In the meantime, however, we were able to identify compounding pharmacy who could create a topical sodium thiosulfate ointment. The patient has that with him today. Both of the right foot wounds are now closed. The left dorsal foot wound has contracted but still has a layer of slough on the surface. The medial calf wounds on the right all look a little bit better. They still have heavy slough along with the chunks of calcium. Everything is stained purple. 04/14/2022: The wound on his dorsal left foot is a little bit smaller again today. There is still slough accumulation on the surface. The medial calf wounds have quite a bit less slough accumulation today. His right leg, however, is warm and erythematous, concerning for cellulitis. 04/14/2022: He completed his course of Augmentin yesterday. The medial calf wound sites are much cleaner today and shallower. There is less fragmented calcium in the wounds. He has a lot of lymphedema fluid-like drainage from these wounds, but no purulent discharge or malodor. The wound on his dorsal left foot has also contracted and is shallower. There is a layer of slough over healthy granulation tissue. 05/05/2022: The left dorsal foot wound is smaller today with less slough and more granulation tissue. The right medial calf wounds are  shallower, but have extensive slough and some fat necrosis. The drainage has diminished considerably, however. 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 05/12/2022: The left dorsal foot wound continues to contract and fill with granulation tissue. There is slough on the wound surface. The right medial calf wounds also continue to fill with healthy tissue, but there is remaining slough and fat necrosis present. He had a significant increase in his drainage this week and it was a bright yellow-green color. He also had a new wound open over his right anterior tibial surface associated with the spike of cutaneous calcium. The leg is more red, as well, concerning for infection. 05/19/2022: His culture returned positive for Pseudomonas. He is currently taking a course of oral levofloxacin. We have also been using topical gentamicin mixed in with his sodium thiosulfate. All of the wounds look better this week. 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 There is slough and nonviable subcutaneous tissue still present. The new anterior tibial wound just has a light layer of slough. The dorsal foot wound also has some slough present and appears a little dry today. 05/26/2022: The right medial leg wound continues to improve. It is feeling with granulation tissue, but still accumulates a fairly thick layer of slough on the surface. The more recent anterior tibial wound has remained quite small, but also has some slough on the surface. The left dorsal foot wound has contracted somewhat and has perimeter epithelialization. He completed his oral levofloxacin. 06/02/2022: The left dorsal foot wound continues to improve significantly. There is just a little slough on the wound surface. The right medial leg wound had black drainage, but it looks like silver alginate was  applied last week and I theorized that silver sulfide was created with a combination of the silver alginate and the sodium thiosulfate. It did, however, have a bit of an odor to it and extensive slough accumulation. The small anterior tibial wound also  had a bit of slough and nonviable subcutaneous tissue present. The skin of his leg was rather macerated, as well. 06/09/2022: The left dorsal foot wound is smaller again this week with just a light layer of slough on the surface. The right medial leg wound looks substantially better. The leg and periwound skin are no longer red and macerated. There is good granulation tissue forming with less slough and nonviable tissue. The anterior tibial wound just has a small amount of slough accumulation. 06/16/2022: The left dorsal foot wound continues to contract. His wrap slipped a little bit, however, and his foot is more swollen. The right medial leg wound continues to improve. The underlying tissue is more vital-appearing and there is less slough. He does have substantial drainage. The small anterior tibial wound has a bit of slough accumulation. 06/23/2022: The left dorsal foot wound is about half the size as it was last week. There is just some light slough on the surface and some eschar buildup around the edges. The right anterior leg wound is small with a little slough on the surface. Unfortunately, the right medial leg wound deteriorated fairly substantially. It is malodorous with gray/green/black discoloration. 06/30/2022: The left dorsal foot wound continues to contract. It is nearly closed with just a light layer of slough present. The right anterior leg wound is about the same size and has a small amount of slough on the surface. The right medial leg wound looks a lot better than it did last week. The odor has abated and the tissue is less pulpy and necrotic-looking. There is still a fairly thick layer of slough present. 12/13; the left dorsal foot  wound looks very healthy. There was no need to change the dressing here and no debridement. He has a small area on the right anterior lower leg apparently had not changed much in size. The area on the medial leg is the most problematic area this is large with some depth and a completely nonviable surface today. 07/14/2022: The left dorsal foot wound is smaller today with just a little slough and eschar present. The right anterior lower leg wound is about the same without any significant change. The medial leg wound has a black and green surface and is malodorous. No purulent drainage and the periwound skin is intact. Edema control is suboptimal. 07/21/2022: The dorsal foot wound continues to contract and is nearly closed with just a little bit of slough and eschar on the surface. The right anterior lower leg wound is shallower today but otherwise the dimensions are unchanged. The medial leg wound looks significantly better although it still has a nonviable surface; the odor and black/green surface has resolved. His culture came back with multiple species sensitive to Augmentin, which was prescribed and a very low level of Pseudomonas, which should be handled by the topical gentamicin we have been using. 07/28/2022: The dorsal foot wound is down to just a very tiny opening with a little eschar on the surface. The right anterior tibial wound is about the same with some slough buildup. The right medial leg wound looks quite a bit better this week. There is thick rubbery slough on the surface, but the odor and drainage has improved significantly. 08/04/2022: The dorsal foot wound is almost healed, but the periwound skin has broken down. This appears to be secondary to moisture and adhesive from the absorbent pad. The right anterior tibial wound is shallower. The right medial leg wound continues to improve. It is smaller, still with  rubbery slough on the surface. No malodor or purulent drainage. 08/11/2022:  The dorsal foot wound is about the same size. It is a little bit dry with some slough accumulation. The right anterior tibial wound is also unchanged. The right medial leg wound is filling in further with good granulation tissue. Still with some slough buildup on the surface. 08/18/2022: His left leg is bright red, hot, and swollen with cellulitis. The dorsal foot wound actually looks quite good and is superficial with just a little bit of slough accumulation. The right anterior tibial wound is unchanged. The right medial leg wound has filled in with good granulation tissue but has copious amounts of drainage. 08/25/2022: His cellulitis has responded nicely to the oral antibiotics. His dorsal foot wound is smaller but has a fair amount of slough buildup. The right anterior tibial wound remains stable and unchanged. The right medial leg wound has more granulation tissue under a layer of slough, but continues to have copious drainage. The drainage is actually causing skin irritation and threatens to result in breakdown. 09/01/2022: He continues to have profuse drainage from his wounds which is resulting in tissue maceration on both the dorsum of his left foot and the periwound distal to his medial leg wound. The dorsal foot wound is nearly healed, despite this. The medial right leg wound is filling in with good granulation tissue. We are XHAIDEN, COOMBS ([id number]) [id number] Page 10 of 15 working on getting home health assistance to change his dressings more frequently to try and control the drainage better. 09/08/2022: We have had success in controlling his drainage. The tissue maceration has improved significantly and his swelling has come down quite markedly. The medial right leg wound is much more superficial and has substantially less nonviable tissue present. The anterior tibial wound is a little bit larger, however with some nonviable tissue present. 09/15/2022: The dorsal  foot wound is nearly closed. The anterior tibial wound measures slightly larger today. The medial right leg wound continues to improve quite dramatically with less nonviable tissue and more robust-looking granulation. Edema control is significantly better. 09/22/2022: The left dorsal foot wound remains open, albeit quite tiny. The anterior tibial wound is a little bit larger again today. The medial right leg wound continues to improve with an increase in robust and healthy granulation tissue and less accumulation of nonviable tissue. 09/29/2022: His home health providers wrapped his left leg extremely tightly and he ended up having to cut the wrap off of his foot. This resulted in his foot being very swollen and that the dorsal foot wound is a little bit larger today. The anterior tibial wound is about the same size with a little slough on the surface. The medial right leg wound continues to fill in. There are very few areas with any significant depth. There is still slough accumulation. 3/13; Patient has a left dorsal foot wound along with a right anterior tibial wound and right medial wound. We are using silver alginate to the left dorsal foot wound, Santyl and sodium thiosulfate compound ointment to the right lower extremity wounds under 4 layer compression. Patient has no issues or complaints today. 10/13/2022: The left dorsal foot wound is a little bit bigger today, but his foot is also a little bit more swollen. The right medial leg wound has filled in more and has substantially less depth. There is still some nonviable tissue present. The small anterior lower leg wound looks about the same. 10/20/2022: The left dorsal foot wound is  epithelializing. There does not appear to be much open at this site. There is some eschar around the edges. The right medial leg wound continues to fill in with granulation tissue. The small anterior lower leg wound is a little bit bigger and there is a chunk of calcium  protruding. He continues to have fairly copious drainage from the right leg. 10/27/2022: His home health agency failed to show up at all this past week and as a result, the excessive drainage from his right leg wound has resulted in tissue breakdown. His leg is red and irritated. The wounds themselves look about the same. The wound on his left dorsal foot is nearly closed underneath some eschar. 11/03/2022: We continue to have difficulty with his home health agency. He continues to have significant drainage from his right leg wound which has resulted in even further tissue breakdown. He also was not taking his Lasix as prescribed and I think much of the drainage has been his lymphedema fluid choosing the path of least resistance. The wound on his left dorsal foot is closed, but in the process of cleaning the site, dry skin was pulled off and he has a new small superficial wound just adjacent to where the dorsal foot wound was. Patient History Information obtained from Patient, Chart. Family History Cancer - Mother, Heart Disease - Father,Mother, Hypertension - Father,Siblings, Lung Disease - Siblings, No family history of Diabetes, Hereditary Spherocytosis, Kidney Disease, Seizures, Stroke, Thyroid Problems, Tuberculosis. Social History Former smoker - quit 1991, Marital Status - Married, Alcohol Use - Moderate, Drug Use - No History, Caffeine Use - Daily - coffee. Medical History Eyes Patient has history of Cataracts - right eye removed Denies history of Glaucoma, Optic Neuritis Ear/Nose/Mouth/Throat Denies history of Chronic sinus problems/congestion, Middle ear problems Hematologic/Lymphatic Patient has history of Anemia - macrocytic, Lymphedema Cardiovascular Patient has history of Congestive Heart Failure, Deep Vein Thrombosis - left leg, Hypertension, Peripheral Venous Disease Endocrine Denies history of Type I Diabetes, Type II Diabetes Genitourinary Denies history of End Stage Renal  Disease Integumentary (Skin) Denies history of History of Burn Oncologic Denies history of Received Chemotherapy, Received Radiation Psychiatric Denies history of Anorexia/bulimia, Confinement Anxiety Hospitalization/Surgery History - cervical fusion. - lumbar surgery. - coronary stent placement. - lasik eye surgery. - hydrocele excision. Medical A Surgical History Notes nd Constitutional Symptoms (General Health) obesity Ear/Nose/Mouth/Throat hard of hearing Respiratory frozen diaphragm right Cardiovascular hyperlipidemia Musculoskeletal myositis, lumbar DDD, spinal stenosis, cervical facet joint syndrome [name], [name] ([id number]) [id number] Page 11 of 15 Objective Constitutional Hypertensive, asymptomatic. no acute distress. Vitals Time Taken: 12:43 PM, Height: 71 in, Weight: 267 lbs, BMI: 37.2, Temperature: 97.9 F, Pulse: 72 bpm, Respiratory Rate: 20 breaths/min, Blood Pressure: 165/66 mmHg. Respiratory Normal work of breathing on room air. General Notes: 11/03/2022: He continues to have further tissue breakdown from the copious drainage coming from his right leg wound. There is a small superficial wound adjacent to where his left dorsal foot wound had been. There is slough and nonviable tissue present on both right leg wounds. Integumentary (Hair, Skin) Wound #1 status is Open. Original cause of wound was Gradually Appeared. The date acquired was: 12/07/2021. The wound has been in treatment 38 weeks. The wound is located on the Left,Dorsal Foot. The wound measures 0.1cm length x 0.1cm width x 0.1cm depth; 0.008cm^2 area and 0.001cm^3 volume. There is Fat Layer (Subcutaneous Tissue) exposed. There is no tunneling or undermining noted. There is a none present amount of drainage noted. The wound  margin is distinct with the outline attached to the wound base. There is no granulation within the wound bed. There is no necrotic tissue within the wound bed.  The periwound skin appearance had no abnormalities noted for texture. The periwound skin appearance exhibited: Dry/Scaly, Hemosiderin Staining. The periwound skin appearance did not exhibit: Maceration, Rubor, Erythema. Periwound temperature was noted as No Abnormality. The periwound has tenderness on palpation. Wound #3 status is Open. Original cause of wound was Gradually Appeared. The date acquired was: 12/07/2021. The wound has been in treatment 38 weeks. The wound is located on the Right,Medial Lower Leg. The wound measures 10.3cm length x 7.2cm width x 0.6cm depth; 58.245cm^2 area and 34.947cm^3 volume. There is Fat Layer (Subcutaneous Tissue) exposed. There is no tunneling or undermining noted. There is a large amount of serosanguineous drainage noted. Foul odor after cleansing was noted. The wound margin is distinct with the outline attached to the wound base. There is large (67-100%) red granulation within the wound bed. There is a small (1-33%) amount of necrotic tissue within the wound bed including Adherent Slough. The periwound skin appearance exhibited: Excoriation, Hemosiderin Staining, Erythema. The periwound skin appearance did not exhibit: Dry/Scaly, Maceration, Rubor. The surrounding wound skin color is noted with erythema. Periwound temperature was noted as No Abnormality. The periwound has tenderness on palpation. General Notes: periwound blister present Wound #6 status is Open. Original cause of wound was Gradually Appeared. The date acquired was: 05/12/2022. The wound has been in treatment 25 weeks. The wound is located on the Right,Anterior Lower Leg. The wound measures 0.7cm length x 0.5cm width x 0.5cm depth; 0.275cm^2 area and 0.137cm^3 volume. There is Fat Layer (Subcutaneous Tissue) exposed. There is no tunneling or undermining noted. There is a medium amount of serosanguineous drainage noted. The wound margin is epibole. There is medium (34-66%) red granulation within the  wound bed. There is a medium (34-66%) amount of necrotic tissue within the wound bed including Adherent Slough. The periwound skin appearance had no abnormalities noted for texture. The periwound skin appearance had no abnormalities noted for moisture. The periwound skin appearance exhibited: Hemosiderin Staining. The periwound skin appearance did not exhibit: Atrophie Blanche, Cyanosis, Ecchymosis, Mottled, Pallor, Rubor, Erythema. Periwound temperature was noted as No Abnormality. The periwound has tenderness on palpation. 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 There was a Excisional Skin/Subcutaneous Tissue Debridement with a total area of 43.2 sq cm performed by [name] Guess, MD. With the following instrument(s): Curette to remove Viable and Non-Viable tissue/material. Material removed includes Subcutaneous Tissue and Slough and after achieving pain control using Lidocaine 4% T opical Solution. No specimens were taken. A time out was conducted at 13:15, prior to the start of the procedure. A Minimum amount of bleeding was controlled with Pressure. The procedure was tolerated well with a pain level of 5 throughout and a pain level of 3 following the procedure. Post Debridement Measurements: 10.3cm length x 7.2cm width x 0.6cm depth; 34.947cm^3 volume. Character of Wound/Ulcer Post Debridement requires further debridement. Post procedure Diagnosis Wound #3:  Same as  Pre-Procedure General Notes: Scribed for Dr. [name] by [name] Deed, RN. Pre-procedure diagnosis of Wound #3 is a Lymphedema located on the Right,Medial Lower Leg . There was a Double Layer Compression Therapy Procedure by [name] Deed, RN. Post procedure Diagnosis Wound #3: Same as Pre-Procedure Notes: Urgo. Wound #6 Pre-procedure diagnosis of Wound #6 is a Calcinosis located on the Right,Anterior Lower Leg . There was a Selective/Open Wound Non-Viable Tissue Debridement with a total area of 0.35 sq cm performed by [name] Guess, MD. With the following instrument(s): Curette to remove Non-Viable tissue/material. Material removed includes [facility] after achieving pain control using Lidocaine 4% Topical Solution. No specimens were taken. A time out was conducted at 13:15, prior to the start of the procedure. A Minimum amount of bleeding was controlled with Pressure. The procedure was tolerated well with a BRAIDAN, RICCIARDI ([id number]) [id number] Page 12 of 15 pain level of 2 throughout and a pain level of 0 following the procedure. Post Debridement Measurements: 0.7cm length x 0.5cm width x 0.5cm depth; 0.137cm^3 volume. Character of Wound/Ulcer Post Debridement is improved. Post procedure Diagnosis Wound #6: Same as Pre-Procedure General Notes: Scribed for Dr. [name] by [name] Deed, RN. Wound #1 Pre-procedure diagnosis of Wound #1 is a Lymphedema located on the Left,Dorsal Foot . There was a Double Layer Compression Therapy Procedure by [name] Deed, RN. Post procedure Diagnosis Wound #1: Same as Pre-Procedure Notes: Urgo. Plan Follow-up Appointments: Return Appointment in 1 week. - Dr. [name] Rm 1 Friday 4/19 @ 12:30 pm Nurse Visit: - Friday 4/12 @ 2:45 pm RM 2 Tuesday 4/16 @ 0745 am RM 2 Anesthetic: Wound #1 Left,Dorsal Foot: (In clinic) Topical Lidocaine 4% applied to wound bed Wound #3 Right,Medial Lower Leg: (In clinic) Topical Lidocaine  4% applied to wound bed Wound #6 Right,Anterior Lower Leg: (In clinic) Topical Lidocaine 4% applied to wound bed Bathing/ Shower/ Hygiene: May shower with protection but do not get wound dressing(s) wet. Protect dressing(s) with water repellant cover (for example, large plastic bag) or a cast cover and may then take shower. Edema Control - Lymphedema / SCD / Other: Lymphedema Pumps. Use Lymphedema pumps on leg(s) 2-3 times a day for 45-60 minutes. If wearing any wraps or hose, do not remove them. Continue exercising as instructed. - use 1-2 times per day over wraps Elevate legs to the level of the heart or above for 30 minutes daily and/or when sitting for 3-4 times a day throughout the day. - throughout the day Avoid standing for long periods of time. Exercise regularly Home Health: Discontinue home health for wound care. - patient to be seen in wound clinic 2 times per week Other Home Health Orders/Instructions: - [facility] WOUND #1: - Foot Wound Laterality: Dorsal, Left Peri-Wound Care: Zinc Oxide Ointment 30g tube 2 x Per Week/30 Days Discharge Instructions: Apply Zinc Oxide to periwound with each dressing change as needed for excoriation Peri-Wound Care: Sween Lotion (Moisturizing lotion) 2 x Per Week/30 Days Discharge Instructions: Apply moisturizing lotion as directed Prim Dressing: Sorbalgon [employer] Dressing, 4x4 (in/in) (Generic) 2 x Per Week/30 Days ary Discharge Instructions: or equivalent silver alginate. Apply to wound bed as instructed Secondary Dressing: Woven Gauze Sponge, Non-Sterile 4x4 in (Generic) 2 x Per Week/30 Days Discharge Instructions: Apply over primary dressing as directed. Com pression Wrap: FourPress (4 layer compression wrap) (Generic) 2 x Per Week/30 Days Discharge Instructions: Apply four layer compression as directed. May also use Urgo K2 compression system as alternative. WOUND #3: - Lower Leg Wound Laterality: Right,  Medial Peri-Wound Care: Triamcinolone 15  (g) 2 x Per Week/30 Days Discharge Instructions: Use triamcinolone 15 (g) as directed Peri-Wound Care: Zinc Oxide Ointment 30g tube 2 x Per Week/30 Days Discharge Instructions: Apply Zinc Oxide to excoriated periwound with each dressing change as needed Peri-Wound Care: Sween Lotion (Moisturizing lotion) 2 x Per Week/30 Days Discharge Instructions: Apply moisturizing lotion as directed Prim Dressing: sodium thiosulfate ointment 2 x Per Week/30 Days ary Discharge Instructions: 1:1 mix with santyl to wound bed Secondary Dressing: ABD Pad, 8x10 (Generic) 2 x Per Week/30 Days Discharge Instructions: Apply over primary dressing as directed. Secondary Dressing: Drawtex 4x4 in 2 x Per Week/30 Days Discharge Instructions: Apply over primary dressing as directed. Secondary Dressing: Woven Gauze Sponge, Non-Sterile 4x4 in (Generic) 2 x Per Week/30 Days Discharge Instructions: Apply over primary dressing as directed. Secondary Dressing: Zetuvit Plus 4x8 in 2 x Per Week/30 Days Discharge Instructions: Apply over primary dressing as directed. Com pression Wrap: FourPress (4 layer compression wrap) (Generic) 2 x Per Week/30 Days Discharge Instructions: Apply four layer compression as directed. May also use Urgo K2 compression system as alternative. WOUND #6: - Lower Leg Wound Laterality: Right, Anterior Peri-Wound Care: Triamcinolone 15 (g) 2 x Per Week/30 Days Discharge Instructions: Use triamcinolone 15 (g) ( in clinic) Peri-Wound Care: Zinc Oxide Ointment 30g tube 2 x Per Week/30 Days Discharge Instructions: Apply Zinc Oxide to periwound with each dressing change as needed for excoriation Peri-Wound Care: Sween Lotion (Moisturizing lotion) 2 x Per Week/30 Days Discharge Instructions: Apply moisturizing lotion as directed Prim Dressing: sodium thiosulfate ointment 2 x Per Week/30 Days ary Discharge Instructions: to wound bed with each dressing change Secondary Dressing: ABD Pad, 5x9 (Generic) 2 x  Per Week/30 Days Discharge Instructions: Apply over primary dressing as directed. Secondary Dressing: Woven Gauze Sponge, Non-Sterile 4x4 in (Generic) 2 x Per Week/30 Days Discharge Instructions: Apply over primary dressing as directed. Com pression Wrap: FourPress (4 layer compression wrap) (Generic) 2 x Per Week/30 Days Discharge Instructions: Apply four layer compression as directed. May also use Urgo K2 compression system as alternative. 11/03/2022: He continues to have further tissue breakdown from the copious drainage coming from his right leg wound. There is a small superficial wound adjacent to where his left dorsal foot wound had been. There is slough and nonviable tissue present on both right leg wounds. MICHEAUX, MATHESON ([id number]) [id number] Page 13 of 15 I used a curette to debride slough and slough and subcutaneous tissue from the right leg wounds. Will continue to use his compounded topical sodium thiosulfate to both of these wounds, which are associated with calcinosis cutis. At this point, I think his home health agency has not been the least bit helpful and his wound is actually deteriorated. We will instead have him come for nurse visits to have his wraps and dressings changed. Continue silver alginate to the left dorsal foot with bilateral 4-layer compression equivalent. Follow-up on Tuesday for a nurse visit. Electronic Signature(s) Signed: 11/03/2022 3:20:46 PM By: [name] Guess MD FACS Entered By: [name] on 11/03/2022 15:20:46 -------------------------------------------------------------------------------- HxROS Details Patient Name: Date of Service: YUSEI, BILL [name] 11/03/2022 12:30 PM Medical Record Number: [id number] Patient Account Number: [id number] Date of Birth/Sex: Treating RN: 09-Mar-1951 (72 y.o. M) Primary Care Provider: PA [name], NO Other Clinician: Referring Provider: Treating Provider/Extender: [name] in Treatment: 38 Information Obtained From Patient Chart Constitutional Symptoms (General Health) Medical History: Past Medical History Notes: obesity Eyes Medical History: Positive for: Cataracts -  right eye removed Negative for: Glaucoma; Optic Neuritis Ear/Nose/Mouth/Throat Medical History: Negative for: Chronic sinus problems/congestion; Middle ear problems Past Medical History Notes: hard of hearing Hematologic/Lymphatic Medical History: Positive for: Anemia - macrocytic; Lymphedema Respiratory Medical History: Past Medical History Notes: frozen diaphragm right Cardiovascular Medical History: Positive for: Congestive Heart Failure; Deep Vein Thrombosis - left leg; Hypertension; Peripheral Venous Disease Past Medical History Notes: hyperlipidemia Endocrine Medical History: Negative for: Type I Diabetes; Type II Diabetes Genitourinary Medical History: Negative for: End Stage Renal Disease Integumentary (Skin) Medical History: Negative for: History of Burn AMARRION, PASTORINO ([id number]) [id number] Page 14 of 15 Musculoskeletal Medical History: Past Medical History Notes: myositis, lumbar DDD, spinal stenosis, cervical facet joint syndrome Oncologic Medical History: Negative for: Received Chemotherapy; Received Radiation Psychiatric Medical History: Negative for: Anorexia/bulimia; Confinement Anxiety HBO Extended History Items Eyes: Cataracts Immunizations Pneumococcal Vaccine: Received Pneumococcal Vaccination: Yes Received Pneumococcal Vaccination On or After 60th Birthday: Yes Implantable Devices No devices added Hospitalization / Surgery History Type of Hospitalization/Surgery cervical fusion lumbar surgery coronary stent placement lasik eye surgery hydrocele excision Family and Social History Cancer: Yes - Mother; Diabetes: No; Heart Disease: Yes - Father,Mother; Hereditary Spherocytosis: No; Hypertension: Yes  - Father,Siblings; Kidney Disease: No; Lung Disease: Yes - Siblings; Seizures: No; Stroke: No; Thyroid Problems: No; Tuberculosis: No; Former smoker - quit 1991; Marital Status - Married; Alcohol Use: Moderate; Drug Use: No History; Caffeine Use: Daily - coffee; Financial Concerns: No; Food, Clothing or Shelter Needs: No; Support System Lacking: No; Transportation Concerns: No Electronic Signature(s) Signed: 11/03/2022 4:32:21 PM By: [name] Guess MD FACS Entered By: [name] on 11/03/2022 15:16:01 -------------------------------------------------------------------------------- SuperBill Details Patient Name: Date of Service: YOUSUF, AGER 11/03/2022 Medical Record Number: [id number] Patient Account Number: [id number] Date of Birth/Sex: Treating RN: 01-Apr-1951 (72 y.o. M) Primary Care Provider: PA [name], NO Other Clinician: Referring Provider: Treating Provider/Extender: [name] in Treatment: 38 Diagnosis Coding ICD-10 Codes Code Description [phone] Non-pressure chronic ulcer of other part of right lower leg with fat layer exposed L97.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) I10 Essential (primary) hypertension I25.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 angina pectoris I89.0 Lymphedema, not elsewhere classified Z86.718 Personal history of other venous thrombosis and embolism WYNN, ALLDREDGE ([id number]) [id number] Page 15 of 15 Facility Procedures : CPT4 Code: 46962952 Description: 11042 - DEB SUBQ TISSUE 20 SQ CM/< ICD-10 Diagnosis Description 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 Modifier: Quantity: 1 : CPT4 Code: 84132440 Description: 11045 - DEB SUBQ TISS EA ADDL 20CM ICD-10 Diagnosis Description  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 Modifier: Quantity: 2 : CPT4 Code: 10272536 Description: 97597 - DEBRIDE WOUND 1ST 20 SQ CM OR < ICD-10 Diagnosis Description 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 Modifier: Quantity: 1 : CPT4 Code: 64403474 Description: (Facility Use Only) 29581LT - APPLY MULTLAY COMPRS LWR LT LEG Modifier: 59 Quantity: 1 Physician Procedures : CPT4 Code Description Modifier [id number] 99214 - WC PHYS LEVEL 4 - EST PT 25 ICD-10 Diagnosis Description 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ed L97.522 Non-pressure chronic ulcer of other part of left  foot with fat layer exposed 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 Venous  insufficiency (chronic) (peripheral) Quantity: 1 : [id number] 11042 - WC PHYS SUBQ TISS 20 SQ CM ICD-10 Diagnosis Description 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 Quantity: 1 : [id number] 11045 - WC PHYS SUBQ TISS EA ADDL 20 CM ICD-10 Diagnosis Description 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 Quantity: 2 : 1914782 97597 - WC PHYS DEBR WO ANESTH 20 SQ CM ICD-10 Diagnosis Description 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 Quantity: 1 Electronic Signature(s) Signed: 11/03/2022 4:32:21 PM By: [name] Guess MD FACS Signed: 11/03/2022 5:09:22 PM By: [name] Deed RN, BSN Previous Signature: 11/03/2022 3:21:26 PM Version By: [name] Guess MD FACS Entered By: [name] on 11/03/2022 16:11:01

## 2022-11-04 NOTE — Progress Notes (Signed)
[name] LatinoBRYK, Dvante ([id number]) [id number] Page 1 of 11 Visit Report for 11/03/2022 Arrival Information Details Patient Name: Date of Service: [name], [name] 11/03/2022 12:30 PM Medical Record Number: [id number] Patient Account Number: [id number] Date of Birth/Sex: Treating RN: 1951-07-20 (72 y.o. Damaris SchoonerM) Boehlein, Linda Primary Care Leonarda Leis: PA Zenovia [place], [place] Other Clinician: Referring Erla Bacchi: Treating Sharicka Pogorzelski/Extender: Jennell Cornerannon, Jennifer Conte, [name] in Treatment: 38 Visit Information History Since Last Visit Added or deleted any medications: No Patient Arrived: Walker Any new allergies or adverse reactions: No Arrival Time: 12:41 Had a fall or experienced change in No Accompanied By: self activities of daily living that may affect Transfer Assistance: None risk of falls: Patient Identification Verified: Yes Signs or symptoms of abuse/neglect since last visito No Secondary Verification Process Completed: Yes Hospitalized since last visit: No Patient Requires Transmission-Based Precautions: No Implantable device outside of the clinic excluding No Patient Has Alerts: Yes cellular tissue based products placed in the center Patient Alerts: R ABI= N/C, TBI= .75 since last visit: L ABI =N/C, TBI = .57 Has Dressing in Place as Prescribed: Yes Has Compression in Place as Prescribed: Yes Pain Present Now: No Electronic Signature(s) Signed: 11/03/2022 5:09:22 PM By: Zenaida DeedBoehlein, Linda RN, BSN Entered By: Zenaida DeedBoehlein, Linda on 11/03/2022 12:43:05 -------------------------------------------------------------------------------- Compression Therapy Details Patient Name: Date of Service: [name], [name] 11/03/2022 12:30 PM Medical Record Number: [id number] Patient Account Number: [id number] Date of Birth/Sex: Treating RN: 1951-07-20 (72 y.o. Damaris SchoonerM) Boehlein, Linda Primary Care Gradie Ohm: PA Zenovia [place], [place] Other Clinician: Referring Lady Wisham: Treating Cosimo Schertzer/Extender: Jennell Cornerannon,  Jennifer Conte, [name] in Treatment: 38 Compression Therapy Performed for Wound Assessment: Wound #1 Left,Dorsal Foot Performed By: Clinician Zenaida DeedBoehlein, Linda, RN Compression Type: Double Layer Post Procedure Diagnosis Same as Pre-procedure Notes Urgo Electronic Signature(s) Signed: 11/03/2022 5:09:22 PM By: Zenaida DeedBoehlein, Linda RN, BSN Entered By: Zenaida DeedBoehlein, Linda on 11/03/2022 13:15:54 -------------------------------------------------------------------------------- Compression Therapy Details Patient Name: Date of Service: [name], [name] 11/03/2022 12:30 PM Medical Record Number: [id number] Patient Account Number: [id number] Date of Birth/Sex: Treating RN: 1951-07-20 (72 y.o. Damaris SchoonerM) Boehlein, Linda Primary Care Shavone Nevers: PA Zenovia [place], [place] Other Clinician: Referring Reo Portela: Treating Erastus Bartolomei/Extender: Jennell Cornerannon, Jennifer Conte, [name] in Treatment: 38 Compression Therapy Performed for Wound Assessment: Wound #3 Right,Medial Lower Leg Performed By: Clinician Zenaida DeedBoehlein, Linda, RN Compression TypePerrin Maltese: Double Pershing CoxLayer [place]omburg, Kaisei ([id number]) [id number] Page 2 of 11 Post Procedure Diagnosis Same as Pre-procedure Notes Programmer, multimediaUrgo Electronic Signature(s) Signed: 11/03/2022 5:09:22 PM By: Zenaida DeedBoehlein, Linda RN, BSN Entered By: Zenaida DeedBoehlein, Linda on 11/03/2022 13:15:54 -------------------------------------------------------------------------------- Encounter Discharge Information Details Patient Name: Date of Service: [name], [name] 11/03/2022 12:30 PM Medical Record Number: [id number] Patient Account Number: [id number] Date of Birth/Sex: Treating RN: 1951-07-20 (72 y.o. Damaris SchoonerM) Boehlein, Linda Primary Care Jendaya Gossett: PA Zenovia JordanIENT, [place] Other Clinician: Referring Terreon Ekholm: Treating Verne Cove/Extender: Jennell Cornerannon, Jennifer Conte, [name] in Treatment: 38 Encounter Discharge Information Items Post Procedure Vitals Discharge Condition: Stable Temperature (F): 97.9 Ambulatory Status: Walker Pulse  (bpm): 72 Discharge Destination: Home Respiratory Rate (breaths/min): 18 Transportation: Private Auto Blood Pressure (mmHg): 165/66 Accompanied By: self Schedule Follow-up Appointment: Yes Clinical Summary of Care: Patient Declined Electronic Signature(s) Signed: 11/03/2022 4:12:19 PM By: Zenaida DeedBoehlein, Linda RN, BSN Entered By: Zenaida DeedBoehlein, Linda on 11/03/2022 16:12:18 -------------------------------------------------------------------------------- Lower Extremity Assessment Details Patient Name: Date of Service: [name], [name] 11/03/2022 12:30 PM Medical Record Number: [id number] Patient Account Number: [id number] Date of Birth/Sex: Treating RN: 1951-07-20 (72 y.o. Damaris SchoonerM) Boehlein, Linda Primary Care Dom Haverland: PA Zenovia JordanIENT, [place] Other Clinician: Referring Pavneet Markwood: Treating Shoshanah Dapper/Extender: Jennell Cornerannon, Jennifer Conte, [name] in Treatment: 38 Edema  Assessment Assessed: [Left: No] [Right: No] Edema: [Left: Yes] [Right: Yes] Calf Left: Right: Point of Measurement: From Medial Instep 39 cm 41 cm Ankle Left: Right: Point of Measurement: From Medial Instep 22.7 cm 26.5 cm Vascular Assessment Pulses: Dorsalis Pedis Palpable: [Left:Yes] [Right:No] Electronic Signature(s) Signed: 11/03/2022 5:09:22 PM By: Zenaida Deed RN, BSN Entered By: Zenaida Deed on 11/03/2022 12:51:18 [name] ([id number]) [id number] Page 3 of 11 -------------------------------------------------------------------------------- Multi Wound Chart Details Patient Name: Date of Service: ETHEN, BANNAN 11/03/2022 12:30 PM Medical Record Number: [id number] Patient Account Number: [id number] Date of Birth/Sex: Treating RN: [birth year] (72 y.o. M) Primary Care Dariana Garbett: PA [name], NO Other Clinician: Referring Aleah Ahlgrim: Treating Zarielle Cea/Extender: Luvenia Redden, Edwena Felty in Treatment: 38 Vital Signs Height(in): 71 Pulse(bpm): 72 Weight(lbs): 267 Blood Pressure(mmHg): 165/66 Body Mass  Index(BMI): 37.2 Temperature(F): 97.9 Respiratory Rate(breaths/min): 20 [1:Photos:] Left, Dorsal Foot Right, Medial Lower Leg Right, Anterior Lower Leg Wound Location: Gradually Appeared Gradually Appeared Gradually Appeared Wounding Event: Lymphedema Lymphedema Calcinosis Primary Etiology: Cataracts, Anemia, Lymphedema, Cataracts, Anemia, Lymphedema, Cataracts, Anemia, Lymphedema, Comorbid History: Congestive Heart Failure, Deep Vein Congestive Heart Failure, Deep Vein Congestive Heart Failure, Deep Vein Thrombosis, Hypertension, Peripheral Thrombosis, Hypertension, Peripheral Thrombosis, Hypertension, Peripheral Venous Disease Venous Disease Venous Disease 12/07/2021 12/07/2021 05/12/2022 Date Acquired: 38 38 25 Weeks of Treatment: Open Open Open Wound Status: No No No Wound Recurrence: 0.1x0.1x0.1 10.3x7.2x0.6 0.7x0.5x0.5 Measurements L x W x D (cm) 0.008 58.245 0.275 A (cm) : rea 0.001 34.947 0.137 Volume (cm) : 99.80% -990.50% -16.50% % Reduction in Area: 99.90% -1536.10% -470.80% % Reduction in Volume: Full Thickness Without Exposed Full Thickness With Exposed Support Full Thickness Without Exposed Classification: Support Structures Structures Support Structures None Present Large Medium Exudate Amount: N/A Serosanguineous Serosanguineous Exudate Type: N/A red, brown red, brown Exudate Color: No Yes No Foul Odor A Cleansing: fter N/A No N/A Odor Anticipated Due to Product Use: Distinct, outline attached Distinct, outline attached Epibole Wound Margin: None Present (0%) Large (67-100%) Medium (34-66%) Granulation A mount: N/A Red Red Granulation Quality: None Present (0%) Small (1-33%) Medium (34-66%) Necrotic Amount: Fat Layer (Subcutaneous Tissue): Yes Fat Layer (Subcutaneous Tissue): Yes Fat Layer (Subcutaneous Tissue): Yes Exposed Structures: Fascia: No Fascia: No Fascia: No Tendon: No Tendon: No Tendon: No Muscle: No Muscle: No Muscle:  No Joint: No Joint: No Joint: No Bone: No Bone: No Bone: No Large (67-100%) Small (1-33%) Small (1-33%) Epithelialization: N/A Debridement - Excisional Debridement - Selective/Open Wound Debridement: Pre-procedure Verification/Time Out N/A 13:15 13:15 Taken: N/A Lidocaine 4% Topical Solution Lidocaine 4% Topical Solution Pain Control: N/A Subcutaneous, [employer] Tissue Debrided: N/A Skin/Subcutaneous Tissue Non-Viable Tissue Level: N/A 43.2 0.35 Debridement A (sq cm): rea N/A Curette Curette Instrument: N/A Minimum Minimum Bleeding: N/A Pressure Pressure Hemostasis A chieved: N/A 5 2 Procedural Pain: N/A 3 0 Post Procedural Pain: N/A Procedure was tolerated well Procedure was tolerated well Debridement Treatment Response: N/A 10.3x7.2x0.6 0.7x0.5x0.5 Post Debridement Measurements L x W x D (cm) N/A 34.947 0.137 Post Debridement Volume: (cm) CHAYSEN, TILLMAN ([id number]) [phone].pdf Page 4 of 11 Excoriation: No Excoriation: Yes Excoriation: No Periwound Skin Texture: Induration: No Callus: No Crepitus: No Rash: No Scarring: No Dry/Scaly: Yes Maceration: No Maceration: No Periwound Skin Moisture: Maceration: No Dry/Scaly: No Dry/Scaly: No Hemosiderin Staining: Yes Erythema: Yes Hemosiderin Staining: Yes Periwound Skin Color: Erythema: No Hemosiderin Staining: Yes Atrophie Blanche: No Rubor: No Rubor: No Cyanosis: No Ecchymosis: No Erythema: No Mottled: No Pallor: No Rubor: No N/A Increased N/A Erythema Change: No Abnormality  No Abnormality No Abnormality Temperature: Yes Yes Yes Tenderness on Palpation: N/A periwound blister present N/A Assessment Notes: Compression Therapy Compression Therapy Debridement Procedures Performed: Debridement Treatment Notes Electronic Signature(s) Signed: 11/03/2022 3:14:45 PM By: Duanne Guess MD FACS Entered By: Duanne Guess on 11/03/2022  15:14:45 -------------------------------------------------------------------------------- Multi-Disciplinary Care Plan Details Patient Name: Date of Service: DIMAS, SCHECK 11/03/2022 12:30 PM Medical Record Number: [id number] Patient Account Number: [id number] Date of Birth/Sex: Treating RN: [birth year] (72 y.o. [name] Primary Care Amear Strojny: PA [name], [place] Other Clinician: Referring Kiffany Schelling: Treating Babara Buffalo/Extender: [name] in Treatment: 38 Multidisciplinary Care Plan reviewed with physician Active Inactive Venous Leg Ulcer Nursing Diagnoses: Knowledge deficit related to disease process and management Potential for venous Insuffiency (use before diagnosis confirmed) Goals: Patient will maintain optimal edema control Date Initiated: 02/17/2022 Target Resolution Date: 11/24/2022 Goal Status: Active Interventions: Assess peripheral edema status every visit. Compression as ordered Provide education on venous insufficiency Treatment Activities: Non-invasive vascular studies : 02/17/2022 Therapeutic compression applied : 02/17/2022 Notes: Wound/Skin Impairment Nursing Diagnoses: Impaired tissue integrity Knowledge deficit related to ulceration/compromised skin integrity Goals: Patient/caregiver will verbalize understanding of skin care regimen Date Initiated: 02/17/2022 Target Resolution Date: 11/24/2022 CAINAN, TRULL ([id number]) [id number] Page 5 of 11 Goal Status: Active Ulcer/skin breakdown will have a volume reduction of 30% by week 4 Date Initiated: 02/17/2022 Date Inactivated: 03/10/2022 Target Resolution Date: 03/10/2022 Unmet Reason: infection being treated, Goal Status: Unmet waiting on derm appte Ulcer/skin breakdown will have a volume reduction of 50% by week 8 Date Initiated: 03/10/2022 Date Inactivated: 04/14/2022 Target Resolution Date: 04/07/2022 Goal Status: Unmet Unmet Reason: calcinosis Ulcer/skin  breakdown will have a volume reduction of 80% by week 12 Date Initiated: 04/14/2022 Date Inactivated: 05/05/2022 Target Resolution Date: 05/05/2022 Unmet Reason: infection, chronic Goal Status: Unmet condition Interventions: Assess patient/caregiver ability to obtain necessary supplies Assess patient/caregiver ability to perform ulcer/skin care regimen upon admission and as needed Assess ulceration(s) every visit Provide education on ulcer and skin care Treatment Activities: Skin care regimen initiated : 02/17/2022 Topical wound management initiated : 02/17/2022 Notes: Electronic Signature(s) Signed: 11/03/2022 5:09:22 PM By: Zenaida Deed RN, BSN Entered By: Zenaida Deed on 11/03/2022 13:06:50 -------------------------------------------------------------------------------- Pain Assessment Details Patient Name: Date of Service: [name], [name] 11/03/2022 12:30 PM Medical Record Number: [id number] Patient Account Number: [id number] Date of Birth/Sex: Treating RN: [birth year] (72 y.o. [name] Primary Care Veronda Gabor: PA [name], [place] Other Clinician: Referring Sherley Leser: Treating Nacole Fluhr/Extender: [name] in Treatment: 38 Active Problems Location of Pain Severity and Description of Pain Patient Has Paino No Site Locations Rate the pain. Current Pain Level: 0 Pain Management and Medication Current Pain Management: Electronic Signature(s) Signed: 11/03/2022 5:09:22 PM By: Zenaida Deed RN, BSN Entered By: Zenaida Deed on 11/03/2022 12:43:41 [name] ([id number]) [id number] Page 6 of 11 -------------------------------------------------------------------------------- Patient/Caregiver Education Details Patient Name: Date of Service: [name], [name] 4/10/2024andnbsp12:30 PM Medical Record Number: [id number] Patient Account Number: [id number] Date of Birth/Gender: Treating RN: 10-01-1950 (72 y.o. [name] Primary Care Physician: PA [name], [place] Other Clinician: Referring Physician: Treating Physician/Extender: [name] in Treatment: 18 Education Assessment Education Provided To: Patient Education Topics Provided Venous: Methods: Explain/Verbal Responses: Reinforcements needed, State content correctly Electronic Signature(s) Signed: 11/03/2022 5:09:22 PM By: Zenaida Deed RN, BSN Entered By: Zenaida Deed on 11/03/2022 13:07:15 -------------------------------------------------------------------------------- Wound Assessment Details Patient Name: Date of Service: GIFFORD, BALLON 11/03/2022 12:30 PM Medical Record Number: [id number] Patient Account Number: [id number] Date of  Birth/Sex: Treating RN: 1951/05/09 (72 y.o. [name] Primary Care Percy Winterrowd: PA [name], [place] Other Clinician: Referring Yichen Gilardi: Treating Resha Filippone/Extender: [name] in Treatment: 38 Wound Status Wound Number: 1 Primary Lymphedema Etiology: Wound Location: Left, Dorsal Foot Wound Open Wounding Event: Gradually Appeared Status: Date Acquired: 12/07/2021 Comorbid Cataracts, Anemia, Lymphedema, Congestive Heart Failure, Deep Weeks Of Treatment: 38 History: Vein Thrombosis, Hypertension, Peripheral Venous Disease Clustered Wound: No Photos Wound Measurements Length: (cm) 0.1 Width: (cm) 0.1 Depth: (cm) 0.1 Area: (cm) 0.008 Volume: (cm) 0.001 % Reduction in Area: 99.8% % Reduction in Volume: 99.9% Epithelialization: Large (67-100%) Tunneling: No Undermining: No Wound Description Classification: Full Thickness Without Exposed Support Structures Wound Margin: Distinct, outline attached Exudate Amount: None Present Hearns, Amair ([id number]) Wound Bed Granulation Amount: None Present (0%) Necrotic Amount: None Present (0%) Foul Odor After Cleansing: No Slough/Fibrino No [phone].pdf Page 7 of 11 Exposed  Structure Fascia Exposed: No Fat Layer (Subcutaneous Tissue) Exposed: Yes Tendon Exposed: No Muscle Exposed: No Joint Exposed: No Bone Exposed: No Periwound Skin Texture Texture Color No Abnormalities Noted: Yes No Abnormalities Noted: No Erythema: No Moisture Hemosiderin Staining: Yes No Abnormalities Noted: No Rubor: No Dry / Scaly: Yes Maceration: No Temperature / Pain Temperature: No Abnormality Tenderness on Palpation: Yes Treatment Notes Wound #1 (Foot) Wound Laterality: Dorsal, Left Cleanser Peri-Wound Care Zinc Oxide Ointment 30g tube Discharge Instruction: Apply Zinc Oxide to periwound with each dressing change as needed for excoriation Sween Lotion (Moisturizing lotion) Discharge Instruction: Apply moisturizing lotion as directed Topical Primary Dressing Sorbalgon AG Dressing, 4x4 (in/in) Discharge Instruction: or equivalent silver alginate. Apply to wound bed as instructed Secondary Dressing Woven Gauze Sponge, Non-Sterile 4x4 in Discharge Instruction: Apply over primary dressing as directed. Secured With Compression Wrap FourPress (4 layer compression wrap) Discharge Instruction: Apply four layer compression as directed. May also use Urgo K2 compression system as alternative. Compression Stockings Add-Ons Electronic Signature(s) Signed: 11/03/2022 5:09:22 PM By: Zenaida Deed RN, BSN Entered By: Zenaida Deed on 11/03/2022 13:04:46 -------------------------------------------------------------------------------- Wound Assessment Details Patient Name: Date of Service: MAGED, MOCZYGEMBA 11/03/2022 12:30 PM Medical Record Number: [id number] Patient Account Number: [id number] Date of Birth/Sex: Treating RN: 1950/11/10 (72 y.o. [name] Primary Care Isaac Dubie: PA [name], [place] Other Clinician: Referring Samayra Hebel: Treating Ilyse Tremain/Extender: [name] Weeks in Treatment: 38 Wound Status Wound Number: 3 Primary  Lymphedema Etiology: Wound Location: Right, Medial Lower Leg Wound Open Wounding Event: Gradually Appeared Status: Date Acquired: 12/07/2021 Comorbid Cataracts, Anemia, Lymphedema, Congestive Heart Failure, Deep Weeks Of Treatment: 38 History: Vein Thrombosis, Hypertension, Peripheral Venous Disease Clustered Wound: No LEMOYNE, CHARPING ([id number]) [id number] Page 8 of 11 Photos Wound Measurements Length: (cm) 10.3 Width: (cm) 7.2 Depth: (cm) 0.6 Area: (cm) 58.245 Volume: (cm) 34.947 % Reduction in Area: -990.5% % Reduction in Volume: -1536.1% Epithelialization: Small (1-33%) Tunneling: No Undermining: No Wound Description Classification: Full Thickness With Exposed Suppo Wound Margin: Distinct, outline attached Exudate Amount: Large Exudate Type: Serosanguineous Exudate Color: red, brown rt Structures Foul Odor After Cleansing: Yes Due to Product Use: No Slough/Fibrino Yes Wound Bed Granulation Amount: Large (67-100%) Exposed Structure Granulation Quality: Red Fascia Exposed: No Necrotic Amount: Small (1-33%) Fat Layer (Subcutaneous Tissue) Exposed: Yes Necrotic Quality: Adherent Slough Tendon Exposed: No Muscle Exposed: No Joint Exposed: No Bone Exposed: No Periwound Skin Texture Texture Color No Abnormalities Noted: No No Abnormalities Noted: No Excoriation: Yes Erythema: Yes Erythema Change: Increased Moisture Hemosiderin Staining: Yes No Abnormalities Noted: No Rubor: No Dry / Scaly: No  Maceration: No Temperature / Pain Temperature: No Abnormality Tenderness on Palpation: Yes Assessment Notes periwound blister present Treatment Notes Wound #3 (Lower Leg) Wound Laterality: Right, Medial Cleanser Peri-Wound Care Triamcinolone 15 (g) Discharge Instruction: Use triamcinolone 15 (g) as directed Zinc Oxide Ointment 30g tube Discharge Instruction: Apply Zinc Oxide to excoriated periwound with each dressing change as  needed Sween Lotion (Moisturizing lotion) Discharge Instruction: Apply moisturizing lotion as directed Topical Primary Dressing sodium thiosulfate ointment Discharge Instruction: 1:1 mix with santyl to wound bed Secondary Dressing ABD Pad, 8x10 AYDENN, [name] ([id number]) [id number] Page 9 of 11 Discharge Instruction: Apply over primary dressing as directed. Drawtex 4x4 in Discharge Instruction: Apply over primary dressing as directed. Woven Gauze Sponge, Non-Sterile 4x4 in Discharge Instruction: Apply over primary dressing as directed. Zetuvit Plus 4x8 in Discharge Instruction: Apply over primary dressing as directed. Secured With Compression Wrap FourPress (4 layer compression wrap) Discharge Instruction: Apply four layer compression as directed. May also use Urgo K2 compression system as alternative. Compression Stockings Add-Ons Electronic Signature(s) Signed: 11/03/2022 5:09:22 PM By: Zenaida Deed RN, BSN Entered By: Zenaida Deed on 11/03/2022 13:05:55 -------------------------------------------------------------------------------- Wound Assessment Details Patient Name: Date of Service: JAYMES, HANG 11/03/2022 12:30 PM Medical Record Number: [id number] Patient Account Number: [id number] Date of Birth/Sex: Treating RN: [birth year] (72 y.o. [name] Primary Care Dalaysia Harms: PA [name], [place] Other Clinician: Referring Adelynne Joerger: Treating Koron Godeaux/Extender: [name] in Treatment: 38 Wound Status Wound Number: 6 Primary Calcinosis Etiology: Wound Location: Right, Anterior Lower Leg Wound Open Wounding Event: Gradually Appeared Status: Date Acquired: 05/12/2022 Comorbid Cataracts, Anemia, Lymphedema, Congestive Heart Failure, Deep Weeks Of Treatment: 25 History: Vein Thrombosis, Hypertension, Peripheral Venous Disease Clustered Wound: No Photos Wound Measurements Length: (cm) 0.7 Width: (cm) 0.5 Depth: (cm)  0.5 Area: (cm) 0.275 Volume: (cm) 0.137 % Reduction in Area: -16.5% % Reduction in Volume: -470.8% Epithelialization: Small (1-33%) Tunneling: No Undermining: No Wound Description Classification: Full Thickness Without Exposed Support Structures Wound Margin: Epibole Exudate Amount: Medium Exudate Type: Serosanguineous Exudate Color: red, brown Foul Odor After Cleansing: No Slough/Fibrino Yes Wound Bed Granulation Amount: Medium (34-66%) Exposed Structure Granulation Quality: Red Fascia Exposed: No Lipschutz, Jashon ([id number]) [id number] Page 10 of 11 Necrotic Amount: Medium (34-66%) Fat Layer (Subcutaneous Tissue) Exposed: Yes Necrotic Quality: Adherent Slough Tendon Exposed: No Muscle Exposed: No Joint Exposed: No Bone Exposed: No Periwound Skin Texture Texture Color No Abnormalities Noted: Yes No Abnormalities Noted: No Atrophie Blanche: No Moisture Cyanosis: No No Abnormalities Noted: Yes Ecchymosis: No Erythema: No Hemosiderin Staining: Yes Mottled: No Pallor: No Rubor: No Temperature / Pain Temperature: No Abnormality Tenderness on Palpation: Yes Treatment Notes Wound #6 (Lower Leg) Wound Laterality: Right, Anterior Cleanser Peri-Wound Care Triamcinolone 15 (g) Discharge Instruction: Use triamcinolone 15 (g) ( in clinic) Zinc Oxide Ointment 30g tube Discharge Instruction: Apply Zinc Oxide to periwound with each dressing change as needed for excoriation Sween Lotion (Moisturizing lotion) Discharge Instruction: Apply moisturizing lotion as directed Topical Primary Dressing sodium thiosulfate ointment Discharge Instruction: to wound bed with each dressing change Secondary Dressing ABD Pad, 5x9 Discharge Instruction: Apply over primary dressing as directed. Woven Gauze Sponge, Non-Sterile 4x4 in Discharge Instruction: Apply over primary dressing as directed. Secured With Compression Wrap FourPress (4 layer compression  wrap) Discharge Instruction: Apply four layer compression as directed. May also use Urgo K2 compression system as alternative. Compression Stockings Add-Ons Electronic Signature(s) Signed: 11/03/2022 5:09:22 PM By: Zenaida Deed RN, BSN Entered By: Zenaida Deed on 11/03/2022 13:06:34 -------------------------------------------------------------------------------- Vitals Details  Patient Name: Date of Service: KAIRI, OCASIO 11/03/2022 12:30 PM Medical Record Number: [id number] Patient Account Number: [id number] Date of Birth/Sex: Treating RN: 1951-07-22 (72 y.o. [name] Primary Care Sabeen Piechocki: PA [name], [place] Other Clinician: Referring Tea Collums: Treating Betrice Wanat/Extender: [name] in Treatment: [address] KAITLIN, POORMAN ([id number]) [id number] Page 11 of 11 Time Taken: 12:43 Temperature (F): 97.9 Height (in): 71 Pulse (bpm): 72 Weight (lbs): 267 Respiratory Rate (breaths/min): 20 Body Mass Index (BMI): 37.2 Blood Pressure (mmHg): 165/66 Reference Range: 80 - 120 mg / dl Electronic Signature(s) Signed: 11/03/2022 5:09:22 PM By: Zenaida Deed RN, BSN Entered By: Zenaida Deed on 11/03/2022 12:43:28

## 2022-11-05 ENCOUNTER — Encounter (HOSPITAL_BASED_OUTPATIENT_CLINIC_OR_DEPARTMENT_OTHER): Payer: Medicare Other | Admitting: General Surgery

## 2022-11-05 DIAGNOSIS — I2510 Atherosclerotic heart disease of native coronary artery without angina pectoris: Secondary | ICD-10-CM | POA: Diagnosis not present

## 2022-11-05 DIAGNOSIS — L942 Calcinosis cutis: Secondary | ICD-10-CM | POA: Diagnosis not present

## 2022-11-05 DIAGNOSIS — I10 Essential (primary) hypertension: Secondary | ICD-10-CM | POA: Diagnosis not present

## 2022-11-05 DIAGNOSIS — I872 Venous insufficiency (chronic) (peripheral): Secondary | ICD-10-CM | POA: Diagnosis not present

## 2022-11-05 DIAGNOSIS — L97522 Non-pressure chronic ulcer of other part of left foot with fat layer exposed: Secondary | ICD-10-CM | POA: Diagnosis not present

## 2022-11-05 DIAGNOSIS — L97812 Non-pressure chronic ulcer of other part of right lower leg with fat layer exposed: Secondary | ICD-10-CM | POA: Diagnosis not present

## 2022-11-09 ENCOUNTER — Encounter (HOSPITAL_BASED_OUTPATIENT_CLINIC_OR_DEPARTMENT_OTHER): Payer: Medicare Other | Admitting: General Surgery

## 2022-11-09 DIAGNOSIS — L942 Calcinosis cutis: Secondary | ICD-10-CM | POA: Diagnosis not present

## 2022-11-09 DIAGNOSIS — L97522 Non-pressure chronic ulcer of other part of left foot with fat layer exposed: Secondary | ICD-10-CM | POA: Diagnosis not present

## 2022-11-09 DIAGNOSIS — I10 Essential (primary) hypertension: Secondary | ICD-10-CM | POA: Diagnosis not present

## 2022-11-09 DIAGNOSIS — I872 Venous insufficiency (chronic) (peripheral): Secondary | ICD-10-CM | POA: Diagnosis not present

## 2022-11-09 DIAGNOSIS — I2510 Atherosclerotic heart disease of native coronary artery without angina pectoris: Secondary | ICD-10-CM | POA: Diagnosis not present

## 2022-11-09 DIAGNOSIS — L97812 Non-pressure chronic ulcer of other part of right lower leg with fat layer exposed: Secondary | ICD-10-CM | POA: Diagnosis not present

## 2022-11-09 NOTE — Progress Notes (Signed)
[name], [name] ([id number]) [id number] Page 1 of 6 Visit Report for 11/09/2022 Arrival Information Details Patient Name: Date of Service: [name], [name] 11/09/2022 7:45 A M Medical Record Number: [id number] Patient Account Number: [id number] Date of Birth/Sex: Treating RN: 1951-03-20 (72 y.o. [name] Primary Care [name]: PA [name], NO Other Clinician: Referring [name]: Treating [name]/Extender: [name] in Treatment: 39 Visit Information History Since Last Visit Added or deleted any medications: No Patient Arrived: Wheel Chair Any new allergies or adverse reactions: No Arrival Time: 08:04 Had a fall or experienced change in No Accompanied By: self activities of daily living that may affect Transfer Assistance: None risk of falls: Patient Identification Verified: Yes Signs or symptoms of abuse/neglect since last visito No Secondary Verification Process Completed: Yes Hospitalized since last visit: No Patient Requires Transmission-Based Precautions: No Implantable device outside of the clinic excluding No Patient Has Alerts: Yes cellular tissue based products placed in the center Patient Alerts: R ABI= N/C, TBI= .75 since last visit: L ABI =N/C, TBI = .57 Has Dressing in Place as Prescribed: Yes Has Compression in Place as Prescribed: Yes Pain Present Now: No Electronic Signature(s) Signed: 11/09/2022 3:54:21 PM By: [name] Entered By: [name] on 11/09/2022 08:04:23 -------------------------------------------------------------------------------- Compression Therapy Details Patient Name: Date of Service: [name], [name] 11/09/2022 7:45 A M Medical Record Number: [id number] Patient Account Number: [id number] Date of Birth/Sex: Treating RN: 1950/10/22 (72 y.o. [name] Primary Care Khyan Oats: PA [name], NO Other Clinician: Referring Warrene Kapfer: Treating Laticha Ferrucci/Extender: [name] in Treatment: 39 Compression Therapy Performed for Wound Assessment: Wound #6 Right,Anterior Lower Leg Performed By: Clinician [name] Bruin, RN Compression Type: Double Layer Electronic Signature(s) Signed: 11/09/2022 3:54:21 PM By: [name] By: [name] on 11/09/2022 08:05:04 -------------------------------------------------------------------------------- Encounter Discharge Information Details Patient Name: Date of Service: [name] 11/09/2022 7:45 A M Medical Record Number: [id number] Patient Account Number: [id number] Date of Birth/Sex: Treating RN: [birth year] (72 y.o. [name] Primary Care Cosima Prentiss: PA [name], NO Other Clinician: Referring Sanjit Mcmichael: Treating Riaz Onorato/Extender: [name] in Treatment: 39 Encounter Discharge Information Items Discharge Condition: Stable Ambulatory Status: Wheelchair Discharge Destination: Home Transportation: Private Auto Accompanied By: self Schedule Follow-up Appointment: Yes Clinical Summary of Care: Patient [name], [name] ([id number]) [id number] Page 2 of 6 Electronic Signature(s) Signed: 11/09/2022 3:54:21 PM By: [name] By: [name] on 11/09/2022 08:05:34 -------------------------------------------------------------------------------- Patient/Caregiver Education Details Patient Name: Date of Service: [name] 4/16/2024andnbsp7:45 A M Medical Record Number: [id number] Patient Account Number: [id number] Date of Birth/Gender: Treating RN: [birth year] (72 y.o. [name] Primary Care Physician: PA [name], [place] Other Clinician: Referring Physician: Treating Physician/Extender: [name] in Treatment: 62 Education Assessment Education Provided To: Patient Education Topics Provided Safety: Methods: Explain/Verbal Responses:  Reinforcements needed, State content correctly Electronic Signature(s) Signed: 11/09/2022 3:54:21 PM By: [name] Entered By: [name] on 11/09/2022 08:05:24 -------------------------------------------------------------------------------- Wound Assessment Details Patient Name: Date of Service: [name], [name] 11/09/2022 7:45 A M Medical Record Number: [id number] Patient Account Number: [id number] Date of Birth/Sex: Treating RN: July 23, 1951 (72 y.o. [name] Primary Care Philip Eckersley: PA [name], NO Other Clinician: Referring Amadu Schlageter: Treating [name]/Extender: [name] in Treatment: 39 Wound Status Wound Number: 1 Primary Lymphedema Etiology: Wound Location: Left, Dorsal Foot Wound Open Wounding Event: Gradually Appeared Status: Date Acquired: 12/07/2021 Comorbid Cataracts, Anemia, Lymphedema, Congestive Heart Failure, Deep Weeks Of Treatment: 39 History: Vein Thrombosis, Hypertension, Peripheral  Venous Disease Clustered Wound: No Wound Measurements Length: (cm) 0.1 Width: (cm) 0.1 Depth: (cm) 0.1 Area: (cm) 0.008 Volume: (cm) 0.001 % Reduction in Area: 99.8% % Reduction in Volume: 99.9% Epithelialization: Large (67-100%) Wound Description Classification: Full Thickness Without Exposed Support S Wound Margin: Distinct, outline attached Exudate Amount: None Present tructures Foul Odor After Cleansing: No Slough/Fibrino No Wound Bed Granulation Amount: None Present (0%) Exposed Structure Necrotic Amount: None Present (0%) Fascia Exposed: No Fat Layer (Subcutaneous Tissue) Exposed: Yes Tendon Exposed: No Muscle Exposed: No [name], [name] ([id number]) [id number] Page 3 of 6 Joint Exposed: No Bone Exposed: No Periwound Skin Texture Texture Color No Abnormalities Noted: Yes No Abnormalities Noted: No Erythema: No Moisture Hemosiderin Staining: Yes No Abnormalities Noted: No Rubor: No Dry  / Scaly: Yes Maceration: No Temperature / Pain Temperature: No Abnormality Tenderness on Palpation: Yes Treatment Notes Wound #1 (Foot) Wound Laterality: Dorsal, Left Cleanser Peri-Wound Care Zinc Oxide Ointment 30g tube Discharge Instruction: Apply Zinc Oxide to periwound with each dressing change as needed for excoriation Sween Lotion (Moisturizing lotion) Discharge Instruction: Apply moisturizing lotion as directed Topical Primary Dressing Sorbalgon AG Dressing, 4x4 (in/in) Discharge Instruction: or equivalent silver alginate. Apply to wound bed as instructed Secondary Dressing Woven Gauze Sponge, Non-Sterile 4x4 in Discharge Instruction: Apply over primary dressing as directed. Secured With Compression Wrap FourPress (4 layer compression wrap) Discharge Instruction: Apply four layer compression as directed. May also use Urgo K2 compression system as alternative. Compression Stockings Add-Ons Electronic Signature(s) Signed: 11/09/2022 3:54:21 PM By: [name] Entered By: [name] on 11/09/2022 08:04:43 -------------------------------------------------------------------------------- Wound Assessment Details Patient Name: Date of Service: [name], [name] 11/09/2022 7:45 A M Medical Record Number: [id number] Patient Account Number: [id number] Date of Birth/Sex: Treating RN: March 02, 1951 (72 y.o. [name] Primary Care Willette Mudry: PA [name], NO Other Clinician: Referring Carvel Huskins: Treating De Libman/Extender: [name] in Treatment: 39 Wound Status Wound Number: 3 Primary Lymphedema Etiology: Wound Location: Right, Medial Lower Leg Wound Open Wounding Event: Gradually Appeared Status: Date Acquired: 12/07/2021 Comorbid Cataracts, Anemia, Lymphedema, Congestive Heart Failure, Deep Weeks Of Treatment: 39 History: Vein Thrombosis, Hypertension, Peripheral Venous Disease Clustered Wound: No Wound Measurements Length: (cm)  10.3 Width: (cm) 7.2 Depth: (cm) 0.6 Area: (cm) 58.245 Llanas, Kendel ([id number]) Volume: (cm) 34.947 % Reduction in Area: -990.5% % Reduction in Volume: -1536.1% Epithelialization: Small (1-33%) [id number] Page 4 of 6 Wound Description Classification: Full Thickness With Exposed Suppo Wound Margin: Distinct, outline attached Exudate Amount: Large Exudate Type: Serosanguineous Exudate Color: red, brown rt Structures Foul Odor After Cleansing: Yes Due to Product Use: No Slough/Fibrino Yes Wound Bed Granulation Amount: Large (67-100%) Exposed Structure Granulation Quality: Red Fascia Exposed: No Necrotic Amount: Small (1-33%) Fat Layer (Subcutaneous Tissue) Exposed: Yes Necrotic Quality: Adherent Slough Tendon Exposed: No Muscle Exposed: No Joint Exposed: No Bone Exposed: No Periwound Skin Texture Texture Color No Abnormalities Noted: No No Abnormalities Noted: No Excoriation: Yes Erythema: Yes Erythema Change: Increased Moisture Hemosiderin Staining: Yes No Abnormalities Noted: No Rubor: No Dry / Scaly: No Maceration: No Temperature / Pain Temperature: No Abnormality Tenderness on Palpation: Yes Treatment Notes Wound #3 (Lower Leg) Wound Laterality: Right, Medial Cleanser Peri-Wound Care Triamcinolone 15 (g) Discharge Instruction: Use triamcinolone 15 (g) as directed Zinc Oxide Ointment 30g tube Discharge Instruction: Apply Zinc Oxide to excoriated periwound with each dressing change as needed Sween Lotion (Moisturizing lotion) Discharge Instruction: Apply moisturizing lotion as directed Topical Primary Dressing sodium thiosulfate ointment Discharge Instruction: 1:1 mix with santyl to  wound bed Secondary Dressing ABD Pad, 8x10 Discharge Instruction: Apply over primary dressing as directed. Drawtex 4x4 in Discharge Instruction: Apply over primary dressing as directed. Woven Gauze Sponge, Non-Sterile 4x4 in Discharge  Instruction: Apply over primary dressing as directed. Zetuvit Plus 4x8 in Discharge Instruction: Apply over primary dressing as directed. Secured With Compression Wrap FourPress (4 layer compression wrap) Discharge Instruction: Apply four layer compression as directed. May also use Urgo K2 compression system as alternative. Compression Stockings Add-Ons Electronic Signature(s) Signed: 11/09/2022 3:54:21 PM By: [name] Entered By: [name] on 11/09/2022 08:04:48 [name] ([id number]) [id number] Page 5 of 6 -------------------------------------------------------------------------------- Wound Assessment Details Patient Name: Date of Service: [name], [name] 11/09/2022 7:45 A M Medical Record Number: [id number] Patient Account Number: [id number] Date of Birth/Sex: Treating RN: Nov 02, 1950 (72 y.o. [name] Primary Care Kvon Mcilhenny: PA [name], NO Other Clinician: Referring Raeden Schippers: Treating Shantia Sanford/Extender: [name] in Treatment: 39 Wound Status Wound Number: 6 Primary Calcinosis Etiology: Wound Location: Right, Anterior Lower Leg Wound Open Wounding Event: Gradually Appeared Status: Date Acquired: 05/12/2022 Comorbid Cataracts, Anemia, Lymphedema, Congestive Heart Failure, Deep Weeks Of Treatment: 25 History: Vein Thrombosis, Hypertension, Peripheral Venous Disease Clustered Wound: No Wound Measurements Length: (cm) 0.7 Width: (cm) 0.5 Depth: (cm) 0.5 Area: (cm) 0.275 Volume: (cm) 0.137 % Reduction in Area: -16.5% % Reduction in Volume: -470.8% Epithelialization: Small (1-33%) Wound Description Classification: Full Thickness Without Exposed Support Structures Wound Margin: Epibole Exudate Amount: Medium Exudate Type: Serosanguineous Exudate Color: red, brown Foul Odor After Cleansing: No Slough/Fibrino Yes Wound Bed Granulation Amount: Medium (34-66%) Exposed Structure Granulation  Quality: Red Fascia Exposed: No Necrotic Amount: Medium (34-66%) Fat Layer (Subcutaneous Tissue) Exposed: Yes Necrotic Quality: Adherent Slough Tendon Exposed: No Muscle Exposed: No Joint Exposed: No Bone Exposed: No Periwound Skin Texture Texture Color No Abnormalities Noted: Yes No Abnormalities Noted: No Atrophie Blanche: No Moisture Cyanosis: No No Abnormalities Noted: Yes Ecchymosis: No Erythema: No Hemosiderin Staining: Yes Mottled: No Pallor: No Rubor: No Temperature / Pain Temperature: No Abnormality Tenderness on Palpation: Yes Treatment Notes Wound #6 (Lower Leg) Wound Laterality: Right, Anterior Cleanser Peri-Wound Care Triamcinolone 15 (g) Discharge Instruction: Use triamcinolone 15 (g) ( in clinic) Zinc Oxide Ointment 30g tube Discharge Instruction: Apply Zinc Oxide to periwound with each dressing change as needed for excoriation Sween Lotion (Moisturizing lotion) Discharge Instruction: Apply moisturizing lotion as directed Topical [name], [name] ([id number]) [id number] Page 6 of 6 Primary Dressing sodium thiosulfate ointment Discharge Instruction: to wound bed with each dressing change Secondary Dressing ABD Pad, 5x9 Discharge Instruction: Apply over primary dressing as directed. Woven Gauze Sponge, Non-Sterile 4x4 in Discharge Instruction: Apply over primary dressing as directed. Secured With Compression Wrap FourPress (4 layer compression wrap) Discharge Instruction: Apply four layer compression as directed. May also use Urgo K2 compression system as alternative. Compression Stockings Add-Ons Electronic Signature(s) Signed: 11/09/2022 3:54:21 PM By: [name] Entered By: [name] on 11/09/2022 08:04:54

## 2022-11-09 NOTE — Progress Notes (Signed)
MERVIL, WACKER ([id number]) [id number] Page 1 of 1 Visit Report for 11/09/2022 SuperBill Details Patient Name: Date of Service: [name], [name] 11/09/2022 Medical Record Number: [id number] Patient Account Number: [id number] Date of Birth/Sex: Treating RN: March 27, 1951 (72 y.o. 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) I10 Essential (primary) hypertension I25.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 angina pectoris I89.0 Lymphedema, not elsewhere classified Z86.718 Personal history of other venous thrombosis and embolism Facility Procedures CPT4 Code Description Modifier Quantity [id number] (Facility Use Only) [id number] - APPLY MULTLAY COMPRS LWR RT LEG 1 ICD-10 Diagnosis Description 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ed Electronic Signature(s) Signed: 11/09/2022 10:57:43 AM By: Duanne Guess MD FACS Signed: 11/09/2022 3:54:21 PM By: [name] Entered By: [name] on 11/09/2022 08:05:42

## 2022-11-10 ENCOUNTER — Encounter (HOSPITAL_BASED_OUTPATIENT_CLINIC_OR_DEPARTMENT_OTHER): Payer: Medicare Other | Admitting: General Surgery

## 2022-11-11 NOTE — Progress Notes (Signed)
[name], [name] ([id number]) [id number] Page 1 of 5 Visit Report for 11/05/2022 Arrival Information Details Patient Name: Date of Service: [name], [name] 11/05/2022 2:45 PM Medical Record Number: [id number] Patient Account Number: [id number] Date of Birth/Sex: Treating RN: Nov 30, 1950 (72 y.o. [name] Primary Care [name]: PA [name], [place] Other Clinician: Referring [name]: Treating [name]/Extender: Jennell [name] in Treatment: 38 Visit Information History Since Last Visit Added or deleted any medications: No Patient Arrived: Wheel Chair Any new allergies or adverse reactions: No Arrival Time: 14:43 Had a fall or experienced change in No Accompanied By: self activities of daily living that may affect Transfer Assistance: None risk of falls: Patient Identification Verified: Yes Signs or symptoms of abuse/neglect since last visito No Patient Requires Transmission-Based Precautions: No Hospitalized since last visit: No Patient Has Alerts: Yes Implantable device outside of the clinic excluding No Patient Alerts: R ABI= N/C, TBI= .75 cellular tissue based products placed in the center L ABI =N/C, TBI = .57 since last visit: Has Dressing in Place as Prescribed: Yes Pain Present Now: Yes Electronic Signature(s) Signed: 11/05/2022 3:43:45 PM By: [name] Schwalbe RN Entered By: [name] on 11/05/2022 14:44:11 -------------------------------------------------------------------------------- Compression Therapy Details Patient Name: Date of Service: [name], [name] 11/05/2022 2:45 PM Medical Record Number: [id number] Patient Account Number: [id number] Date of Birth/Sex: Treating RN: 1951-05-27 (72 y.o. [name] Primary Care [name]: PA [name], [place] Other Clinician: Referring [name]: Treating [name]/Extender: [name] Weeks in Treatment: 38 Compression Therapy Performed for Wound Assessment: Wound #3  Right,Medial Lower Leg Performed By: [name] Parody, RN Compression Type: Double Layer Electronic Signature(s) Signed: 11/11/2022 1:56:54 PM By: [name] Entered By: [name] on 11/05/2022 15:04:36 -------------------------------------------------------------------------------- Compression Therapy Details Patient Name: Date of Service: [name], [name] 11/05/2022 2:45 PM Medical Record Number: [id number] Patient Account Number: [id number] Date of Birth/Sex: Treating RN: 1950-12-05 (72 y.o. [name] Primary Care [name]: PA [name], [place] Other Clinician: Referring [name]: Treating [name]/Extender: [name] Weeks in Treatment: 38 Compression Therapy Performed for Wound Assessment: Wound #6 Right,Anterior Lower Leg Performed By: [name] Parody, RN Compression Type: Double Layer Electronic Signature(s) Signed: 11/11/2022 1:56:54 PM By: [name] Entered By: [name] on 11/05/2022 15:04:52 [name] ([id number]) [id number] Page 2 of 5 -------------------------------------------------------------------------------- Encounter Discharge Information Details Patient Name: Date of Service: [name], [name] 11/05/2022 2:45 PM Medical Record Number: [id number] Patient Account Number: [id number] Date of Birth/Sex: Treating RN: 1951/02/19 (72 y.o. [name] Primary Care [name]: PA [name], [place] Other Clinician: Referring [name]: Treating [name]/Extender: Jennell [name] in Treatment: 4 Encounter Discharge Information Items Discharge Condition: Stable Ambulatory Status: Wheelchair Discharge Destination: Home Transportation: Private Auto Accompanied By: self Schedule Follow-up Appointment: Yes Clinical Summary of Care: Patient Declined Electronic Signature(s) Signed: 11/11/2022 1:56:54 PM By: [name] Entered By: [name] on 11/05/2022  15:06:42 -------------------------------------------------------------------------------- Patient/Caregiver Education Details Patient Name: Date of Service: [name], [name] 4/12/2024andnbsp2:45 PM Medical Record Number: [id number] Patient Account Number: [id number] Date of Birth/Gender: Treating RN: [birth year] (72 y.o. [name] Primary Care Physician: PA [name], [place] Other Clinician: Referring Physician: Treating Physician/Extender: Jennell [name] in Treatment: 51 Education Assessment Education Provided To: Patient Education Topics Provided Wound/Skin Impairment: Methods: Explain/Verbal Responses: State content correctly Electronic Signature(s) Signed: 11/11/2022 1:56:54 PM By: [name] Entered By: [name] on 11/05/2022 15:06:16 -------------------------------------------------------------------------------- Wound Assessment Details Patient Name: Date of Service: [name], [name] 11/05/2022 2:45 PM Medical Record Number: [id number] Patient  Account Number: [id number] Date of Birth/Sex: Treating RN: June 24, 1951 (72 y.o. [name] Primary Care [name]: PA [name], [place] Other Clinician: Referring [name]: Treating [name]/Extender: Jennell [name] in Treatment: 38 Wound Status Wound Number: 1 Primary Lymphedema Etiology: Wound Location: Left, Dorsal Foot Wound Open Wounding Event: Gradually Appeared Status: Date Acquired: 12/07/2021 Comorbid Cataracts, Anemia, Lymphedema, Congestive Heart Failure, Deep Weeks Of Treatment: 38 History: Vein Thrombosis, Hypertension, Peripheral Venous Disease Clustered Wound: No Wound Measurements [name] ([id number]) Length: (cm) 0.1 Width: (cm) 0.1 Depth: (cm) 0.1 Area: (cm) 0.008 Volume: (cm) 0.001 [id number] Page 3 of 5 % Reduction in Area: 99.8% % Reduction in Volume: 99.9% Epithelialization: Large (67-100%) Tunneling: No Undermining:  No Wound Description Classification: Full Thickness Without Exposed Support Structures Wound Margin: Distinct, outline attached Exudate Amount: None Present Foul Odor After Cleansing: No Slough/Fibrino No Wound Bed Granulation Amount: None Present (0%) Exposed Structure Necrotic Amount: None Present (0%) Fascia Exposed: No Fat Layer (Subcutaneous Tissue) Exposed: Yes Tendon Exposed: No Muscle Exposed: No Joint Exposed: No Bone Exposed: No Periwound Skin Texture Texture Color No Abnormalities Noted: Yes No Abnormalities Noted: No Erythema: No Moisture Hemosiderin Staining: Yes No Abnormalities Noted: No Rubor: No Dry / Scaly: Yes Maceration: No Temperature / Pain Temperature: No Abnormality Tenderness on Palpation: Yes Electronic Signature(s) Signed: 11/05/2022 3:43:45 PM By: [name] Schwalbe RN Entered By: [name] on 11/05/2022 14:46:04 -------------------------------------------------------------------------------- Wound Assessment Details Patient Name: Date of Service: [name], [name] 11/05/2022 2:45 PM Medical Record Number: [id number] Patient Account Number: [id number] Date of Birth/Sex: Treating RN: [birth year] (72 y.o. [name] Primary Care Jadalynn Burr: PA [name], [place] Other Clinician: Referring Meshulem Onorato: Treating Rosalba Totty/Extender: Jennell [name] in Treatment: 38 Wound Status Wound Number: 3 Primary Lymphedema Etiology: Wound Location: Right, Medial Lower Leg Wound Open Wounding Event: Gradually Appeared Status: Date Acquired: 12/07/2021 Comorbid Cataracts, Anemia, Lymphedema, Congestive Heart Failure, Deep Weeks Of Treatment: 38 History: Vein Thrombosis, Hypertension, Peripheral Venous Disease Clustered Wound: No Wound Measurements Length: (cm) 10.3 Width: (cm) 7.2 Depth: (cm) 0.6 Area: (cm) 58.245 Volume: (cm) 34.947 % Reduction in Area: -990.5% % Reduction in Volume: -1536.1% Epithelialization: Small  (1-33%) Tunneling: No Undermining: No Wound Description Classification: Full Thickness With Exposed Suppo Wound Margin: Distinct, outline attached Exudate Amount: Large Exudate Type: Serosanguineous Exudate Color: red, brown rt Structures Foul Odor After Cleansing: Yes Due to Product Use: No Slough/Fibrino Yes Wound Bed Granulation Amount: Large (67-100%) Exposed Structure Granulation Quality: Red Fascia Exposed: No Necrotic Amount: Small (1-33%) Fat Layer (Subcutaneous Tissue) ExposedLAZARO, ISENHOWER ([id number]) [id number] Page 4 of 5 Necrotic Quality: Adherent Slough Tendon Exposed: No Muscle Exposed: No Joint Exposed: No Bone Exposed: No Periwound Skin Texture Texture Color No Abnormalities Noted: No No Abnormalities Noted: No Excoriation: Yes Erythema: Yes Erythema Change: Increased Moisture Hemosiderin Staining: Yes No Abnormalities Noted: No Rubor: No Dry / Scaly: No Maceration: No Temperature / Pain Temperature: No Abnormality Tenderness on Palpation: Yes Electronic Signature(s) Signed: 11/05/2022 3:43:45 PM By: [name] Schwalbe RN Entered By: [name] on 11/05/2022 14:46:38 -------------------------------------------------------------------------------- Wound Assessment Details Patient Name: Date of Service: [name], [name] 11/05/2022 2:45 PM Medical Record Number: [id number] Patient Account Number: [id number] Date of Birth/Sex: Treating RN: 1950-08-13 (72 y.o. [name] Primary Care Marcel [name]: PA [name], [place] Other Clinician: Referring Millissa Deese: Treating Charle Clear/Extender: Jennell [name] in Treatment: 38 Wound Status Wound Number: 6 Primary Calcinosis Etiology: Wound Location: Right, Anterior Lower Leg Wound Open Wounding Event: Gradually Appeared Status: Date Acquired:  05/12/2022 Comorbid Cataracts, Anemia, Lymphedema, Congestive Heart Failure, Deep Weeks Of Treatment: 25 History: Vein  Thrombosis, Hypertension, Peripheral Venous Disease Clustered Wound: No Wound Measurements Length: (cm) 0.7 Width: (cm) 0.5 Depth: (cm) 0.5 Area: (cm) 0.275 Volume: (cm) 0.137 % Reduction in Area: -16.5% % Reduction in Volume: -470.8% Epithelialization: Small (1-33%) Tunneling: No Undermining: No Wound Description Classification: Full Thickness Without Exposed Suppo Wound Margin: Epibole Exudate Amount: Medium Exudate Type: Serosanguineous Exudate Color: red, brown rt Structures Foul Odor After Cleansing: No Slough/Fibrino Yes Wound Bed Granulation Amount: Medium (34-66%) Exposed Structure Granulation Quality: Red Fascia Exposed: No Necrotic Amount: Medium (34-66%) Fat Layer (Subcutaneous Tissue) Exposed: Yes Necrotic Quality: Adherent Slough Tendon Exposed: No Muscle Exposed: No Joint Exposed: No Bone Exposed: No Periwound Skin Texture Texture Color No Abnormalities Noted: Yes No Abnormalities Noted: No Atrophie Blanche: No Moisture Cyanosis: No No Abnormalities Noted: Yes Ecchymosis: No Erythema: No Hemosiderin Staining: Yes Mottled: No Pallor: No [name], [name] ([id number]) [id number] Page 5 of 5 Rubor: No Temperature / Pain Temperature: No Abnormality Tenderness on Palpation: Yes Electronic Signature(s) Signed: 11/05/2022 3:43:45 PM By: [name] Schwalbe RN Entered By: [name] on 11/05/2022 14:47:06 -------------------------------------------------------------------------------- Vitals Details Patient Name: Date of Service: [name] 11/05/2022 2:45 PM Medical Record Number: [id number] Patient Account Number: [id number] Date of Birth/Sex: Treating RN: Oct 29, 1950 (72 y.o. [name] Primary Care Iokepa Geffre: PA [name], [place] Other Clinician: Referring Celia Gibbons: Treating Saraphina Lauderbaugh/Extender: Jennell [name] in Treatment: 38 Vital Signs Time Taken: 14:44 Temperature (F): 98.6 Height (in):  71 Pulse (bpm): 71 Weight (lbs): 267 Respiratory Rate (breaths/min): 16 Body Mass Index (BMI): 37.2 Blood Pressure (mmHg): 157/72 Reference Range: 80 - 120 mg / dl Electronic Signature(s) Signed: 11/05/2022 3:43:45 PM By: [name] Schwalbe RN Entered By: [name] on 11/05/2022 14:45:41

## 2022-11-11 NOTE — Progress Notes (Signed)
[name], [name] ([id number]) [id number] Page 1 of 1 Visit Report for 11/05/2022 SuperBill Details Patient Name: Date of Service: [name], [name] 11/05/2022 Medical Record Number: [id number] Patient Account Number: [id number] Date of Birth/Sex: Treating RN: 1950-09-18 (72 y.o. 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) I10 Essential (primary) hypertension I25.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 angina pectoris I89.0 Lymphedema, not elsewhere classified Z86.718 Personal history of other venous thrombosis and embolism Facility Procedures CPT4 Code Description Modifier Quantity [phone] (Facility Use Only) [phone] - APPLY MULTLAY COMPRS LWR RT LEG 1 ICD-10 Diagnosis Description 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ed Electronic Signature(s) Signed: 11/08/2022 8:53:14 AM By: Duanne Guess MD FACS Signed: 11/11/2022 1:56:54 PM By: Brenton Grills Entered By: Brenton Grills on 11/05/2022 15:14:08

## 2022-11-12 ENCOUNTER — Encounter (HOSPITAL_BASED_OUTPATIENT_CLINIC_OR_DEPARTMENT_OTHER): Payer: Medicare Other | Admitting: General Surgery

## 2022-11-12 DIAGNOSIS — I2510 Atherosclerotic heart disease of native coronary artery without angina pectoris: Secondary | ICD-10-CM | POA: Diagnosis not present

## 2022-11-12 DIAGNOSIS — L97522 Non-pressure chronic ulcer of other part of left foot with fat layer exposed: Secondary | ICD-10-CM | POA: Diagnosis not present

## 2022-11-12 DIAGNOSIS — I10 Essential (primary) hypertension: Secondary | ICD-10-CM | POA: Diagnosis not present

## 2022-11-12 DIAGNOSIS — I872 Venous insufficiency (chronic) (peripheral): Secondary | ICD-10-CM | POA: Diagnosis not present

## 2022-11-12 DIAGNOSIS — L97812 Non-pressure chronic ulcer of other part of right lower leg with fat layer exposed: Secondary | ICD-10-CM | POA: Diagnosis not present

## 2022-11-12 DIAGNOSIS — I890 Lymphedema, not elsewhere classified: Secondary | ICD-10-CM | POA: Diagnosis not present

## 2022-11-12 DIAGNOSIS — L942 Calcinosis cutis: Secondary | ICD-10-CM | POA: Diagnosis not present

## 2022-11-13 NOTE — Progress Notes (Signed)
BRONDON, WANN ([id number]) [id number] Page 1 of 14 Visit Report for 11/12/2022 Chief Complaint Document Details Patient Name: Date of Service: [name], HANNAN 11/12/2022 12:30 PM Medical Record Number: [id number] Patient Account Number: [id number] Date of Birth/Sex: Treating RN: 1951/06/11 (72 y.o. M) Primary Care Provider: PA [name], NO Other Clinician: Referring Provider: Treating Provider/Extender: [name] in Treatment: 39 Information Obtained from: Patient Chief Complaint Patient seen for complaints of Non-Healing Wounds. Electronic Signature(s) Signed: 11/12/2022 1:35:56 PM By: Duanne Guess MD FACS Entered By: Duanne Guess on 11/12/2022 13:35:56 -------------------------------------------------------------------------------- Debridement Details Patient Name: Date of Service: MADISON, [name] [name] 11/12/2022 12:30 PM Medical Record Number: [id number] Patient Account Number: [id number] Date of Birth/Sex: Treating RN: August 24, 1950 (72 y.o. [name] Primary Care Provider: PA [name], [place] Other Clinician: Referring Provider: Treating Provider/Extender: [name] in Treatment: 39 Debridement Performed for Assessment: Wound #3 Right,Medial Lower Leg Performed By: Physician Duanne Guess, MD Debridement Type: Debridement Level of Consciousness (Pre-procedure): Awake and Alert Pre-procedure Verification/Time Out Yes - 13:05 Taken: Start Time: 13:07 Pain Control: Lidocaine 5% topical ointment T Area Debrided (L x W): otal 7 (cm) x 8.5 (cm) = 59.5 (cm) Tissue and other material debrided: Viable, Non-Viable, Slough, Subcutaneous, Slough Level: Skin/Subcutaneous Tissue Debridement Description: Excisional Instrument: Curette Specimen: Tissue Culture Number of Specimens T aken: 1 Bleeding: Minimum Hemostasis Achieved: Pressure Procedural Pain: 4 Post Procedural Pain: 2 Response to Treatment:  Procedure was tolerated well Level of Consciousness (Post- Awake and Alert procedure): Post Debridement Measurements of Total Wound Length: (cm) 13.1 Width: (cm) 8.5 Depth: (cm) 0.6 Volume: (cm) 52.472 Character of Wound/Ulcer Post Debridement: Requires Further Debridement Post Procedure Diagnosis Same as Pre-procedure [name], RN Electronic Signature(s) Signed: 11/12/2022 1:59:44 PM By: Duanne Guess MD FACS [name] ([id number]) [id number] Page 2 of 14 Signed: 11/12/2022 5:46:14 PM By: Zenaida Deed RN, BSN Entered By: Zenaida Deed on 11/12/2022 13:13:38 -------------------------------------------------------------------------------- HPI Details Patient Name: Date of Service: [name], [name] [name] 11/12/2022 12:30 PM Medical Record Number: [id number] Patient Account Number: [id number] Date of Birth/Sex: Treating RN: 1951-02-16 (72 y.o. M) Primary Care Provider: PA [name], NO Other Clinician: Referring Provider: Treating Provider/Extender: [name] in Treatment: 39 History of Present Illness HPI Description: ADMISSION 02/09/2022 This is a 72 year old man who recently moved to [place] from [place]. He has a history of of coronary artery disease and prior left popliteal vein DVT . He is not diabetic and does not smoke. 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 He has had multiple spinal surgeries and has limited mobility. He primarily uses a motorized wheelchair. He has had lymphedema for many years and does have lymphedema pumps although he says he does not use them frequently. He has wounds on his bilateral lower extremities. He was receiving care in [place] for these prior to his move. They were apparently packing his wounds with Betadine soaked gauze. He has worn compression wraps in the past but not recently. He did say that they helped tremendously. In  clinic today, his right ABI is noncompressible but has good signals; the left ABI was 1.17. No formal vascular studies have been performed. On his left dorsal foot, there is a circular lesion that exposes the fat layer. There is fairly heavy slough accumulation within the wound, but no significant odor. On his right medial calf, he has multiple pinholes that have slough buildup in them and probe for about 2  to 4 mm in depth. They are draining clear fluid. On his right lateral midfoot, he has ulceration consistent with venous stasis. It is geographic and has slough accumulation. 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 02/17/2022: All of the wounds are little bit larger today and have more slough accumulation. Today, the medial calf openings are larger and probe to something that feels calcified. There is odor coming from his wounds. His vascular studies are scheduled for August 9 and 10. 02-24-2022 upon evaluation today patient presents for follow-up concerning his ongoing issues here with his lower extremities. With that being said he does have significant problems with what appears to be cellulitis unfortunately the PCR culture that was sent last week got crushed by [employer] in transit. With that being said we are going to reobtain a culture today although I am actually to do it on the right medial leg as this seems to be the most inflamed area today where there is some questionable drainage as well. I am going to remove some of the calcium deposits which are loosening obtain a deeper culture from this area. Fortunately I do not see any evidence of active infection systemically which is good news but locally I think we are having a bigger issue here. He does have his appointment next Thursday with vascular. Patient has also been referred to [facility] for further evaluation and treatment of the calcinosis for  possible sulfate injections. 03/04/2022: The culture that was performed last week grew out multiple species including Klebsiella, Morganella, and Pseudomonas aeruginosa. He had been prescribed Bactrim but this was not adequate to cover all of the species and levofloxacin was recommended. He completed the Bactrim but did not pick up the levofloxacin and begin taking it until yesterday. 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 He had arterial studies performed today. He does have evidence of peripheral arterial disease. Results copied here: +-------+-----------+-----------+------------+------------+ ABI/TBIT oday's ABIT oday's TBIPrevious ABIPrevious TBI +-------+-----------+-----------+------------+------------+ Right [place] 0.75    +-------+-----------+-----------+------------+------------+ Left [place] 0.57    +-------+-----------+-----------+------------+------------+ Arterial wall calcification precludes accurate ankle pressures and ABIs. Summary: Right: Resting right ankle-brachial index indicates noncompressible right lower extremity arteries. The right toe-brachial index is normal. Left: Resting left ankle-brachial index indicates noncompressible left lower extremity arteries. The left toe-brachial index is abnormal. He continues to have further tissue breakdown at the right medial calf and there is ample slough accumulation along with necrotic subcutaneous tissue. The left dorsal foot wound has built up slough, as well. The right medial foot wound is nearly closed with just a light layer of eschar over the surface. The right dorsal lateral foot wound has also accumulated a fair amount of slough and remains quite tender. 03/10/2022: He has been taking the prescribed levofloxacin and has about 3 days left of this.  The erythema and induration on his right leg has improved. He continues to experience further breakdown of the right medial calf wound. There is extensive slough and debris accumulation there. There is heavy slough on his left dorsal foot wound, but no odor or purulent drainage. The right medial foot wound appears to be closed. The right dorsal lateral foot wound also has a fair amount of slough but it is less tender than at our last visit. He still does not have  an appointment with dermatology. 03/22/2022: The right anterior tibial wound has closed. The right lateral foot wound is nearly closed with just a little bit of slough. The left dorsal foot wound is smaller and continues to have some slough buildup but less so than at prior visits. There is good granulation tissue underlying the slough. Unfortunately the right medial calf wound continues to deteriorate. It has a foul odor today and a lot of necrotic tissue, the surrounding skin is also erythematous and moist. 03/30/2022: The right lateral foot wound continues to contract and just has a little bit of slough and eschar present. The left dorsal foot wound is also smaller with slough overlying good granulation tissue. The right medial calf wound actually looks quite a bit better today; there is no odor and there is much less necrotic tissue although some remains present. We have been using topical gentamicin and he has been taking oral Augmentin. ERYN, KREJCI ([id number]) [id number] Page 3 of 14 04/07/2022: The patient saw dermatology at [facility] last week. Unfortunately, it appears that they did not read any of the documentation that was provided. 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 Overall, it was a highly unsatisfactory consultation. In the meantime, however, we were able to identify compounding pharmacy who could create a topical sodium thiosulfate ointment. The patient has that with him today. Both of the right foot wounds are now closed. The left dorsal foot wound has contracted but still has a layer of slough on the surface. The medial calf wounds on the right all look a little bit better. They still have heavy slough along with the chunks of calcium. Everything is stained purple. 04/14/2022: The wound on his dorsal left foot is a little bit smaller again today. There is still slough accumulation on the surface. The medial calf wounds have quite a bit less slough accumulation today. His right leg, however, is warm and erythematous, concerning for cellulitis. 04/14/2022: He completed his course of Augmentin yesterday. The medial calf wound sites are much cleaner today and shallower. There is less fragmented calcium in the wounds. He has a lot of lymphedema fluid-like drainage from these wounds, but no purulent discharge or malodor. The wound on his dorsal left foot has also contracted and is shallower. There is a layer of slough over healthy granulation tissue. 05/05/2022: The left dorsal foot wound is smaller today with less slough and more granulation tissue. The right medial calf wounds are shallower, but have extensive slough and some fat necrosis. The drainage has diminished considerably, however. 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 05/12/2022: The left dorsal foot wound continues to contract and fill with granulation tissue. There is slough on the wound surface. The right medial calf wounds also continue to fill with healthy tissue, but there is remaining slough and fat necrosis present. He had a significant  increase in his drainage this week and it was a bright yellow-green color. He also had a new wound open over his right anterior tibial surface associated with the spike of cutaneous calcium. The leg is more red, as well, concerning for infection. 05/19/2022: His culture returned  positive for Pseudomonas. He is currently taking a course of oral levofloxacin. We have also been using topical gentamicin mixed in with his sodium thiosulfate. All of the wounds look better this week. 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 There is slough and nonviable subcutaneous tissue still present. The new anterior tibial wound just has a light layer of slough. The dorsal foot wound also has some slough present and appears a little dry today. 05/26/2022: The right medial leg wound continues to improve. It is feeling with granulation tissue, but still accumulates a fairly thick layer of slough on the surface. The more recent anterior tibial wound has remained quite small, but also has some slough on the surface. The left dorsal foot wound has contracted somewhat and has perimeter epithelialization. He completed his oral levofloxacin. 06/02/2022: The left dorsal foot wound continues to improve significantly. There is just a little slough on the wound surface. The right medial leg wound had black drainage, but it looks like silver alginate was applied last week and I theorized that silver sulfide was created with a combination of the silver alginate and the sodium thiosulfate. It did, however, have a bit of an odor to it and extensive slough accumulation. The small anterior tibial wound also had a bit of slough and nonviable subcutaneous tissue present. The skin of his leg was rather macerated, as well. 06/09/2022: The left dorsal foot wound is smaller again this week with just a light layer of slough on the surface. The right medial leg wound looks substantially better. The leg and  periwound skin are no longer red and macerated. There is good granulation tissue forming with less slough and nonviable tissue. The anterior tibial wound just has a small amount of slough accumulation. 06/16/2022: The left dorsal foot wound continues to contract. His wrap slipped a little bit, however, and his foot is more swollen. The right medial leg wound continues to improve. The underlying tissue is more vital-appearing and there is less slough. He does have substantial drainage. The small anterior tibial wound has a bit of slough accumulation. 06/23/2022: The left dorsal foot wound is about half the size as it was last week. There is just some light slough on the surface and some eschar buildup around the edges. The right anterior leg wound is small with a little slough on the surface. Unfortunately, the right medial leg wound deteriorated fairly substantially. It is malodorous with gray/green/black discoloration. 06/30/2022: The left dorsal foot wound continues to contract. It is nearly closed with just a light layer of slough present. The right anterior leg wound is about the same size and has a small amount of slough on the surface. The right medial leg wound looks a lot better than it did last week. The odor has abated and the tissue is less pulpy and necrotic-looking. There is still a fairly thick layer of slough present. 12/13; the left dorsal foot wound looks very healthy. There was no need to change the dressing here and no debridement. He has a small area on the right anterior lower leg apparently had not changed much in size. The area on the medial leg is the most problematic area this is large with some depth and a completely nonviable surface today. 07/14/2022: The left dorsal foot wound is smaller today with just a little slough and eschar present. The right anterior lower leg wound is about the same without any significant change. The medial leg wound has a black  and green surface  and is malodorous. No purulent drainage and the periwound skin is intact. Edema control is suboptimal. 07/21/2022: The dorsal foot wound continues to contract and is nearly closed with just a little bit of slough and eschar on the surface. The right anterior lower leg wound is shallower today but otherwise the dimensions are unchanged. The medial leg wound looks significantly better although it still has a nonviable surface; the odor and black/green surface has resolved. His culture came back with multiple species sensitive to Augmentin, which was prescribed and a very low level of Pseudomonas, which should be handled by the topical gentamicin we have been using. 07/28/2022: The dorsal foot wound is down to just a very tiny opening with a little eschar on the surface. The right anterior tibial wound is about the same with some slough buildup. The right medial leg wound looks quite a bit better this week. There is thick rubbery slough on the surface, but the odor and drainage has improved significantly. 08/04/2022: The dorsal foot wound is almost healed, but the periwound skin has broken down. This appears to be secondary to moisture and adhesive from the absorbent pad. The right anterior tibial wound is shallower. The right medial leg wound continues to improve. It is smaller, still with rubbery slough on the surface. No malodor or purulent drainage. 08/11/2022: The dorsal foot wound is about the same size. It is a little bit dry with some slough accumulation. The right anterior tibial wound is also unchanged. The right medial leg wound is filling in further with good granulation tissue. Still with some slough buildup on the surface. 08/18/2022: His left leg is bright red, hot, and swollen with cellulitis. The dorsal foot wound actually looks quite good and is superficial with just a little bit of slough accumulation. The right anterior tibial wound is unchanged. The right medial leg wound has filled in  with good granulation tissue but has copious amounts of drainage. 08/25/2022: His cellulitis has responded nicely to the oral antibiotics. His dorsal foot wound is smaller but has a fair amount of slough buildup. The right anterior tibial wound remains stable and unchanged. The right medial leg wound has more granulation tissue under a layer of slough, but continues to have copious drainage. The drainage is actually causing skin irritation and threatens to result in breakdown. 09/01/2022: He continues to have profuse drainage from his wounds which is resulting in tissue maceration on both the dorsum of his left foot and the periwound distal to his medial leg wound. The dorsal foot wound is nearly healed, despite this. The medial right leg wound is filling in with good granulation tissue. We are working on getting home health assistance to change his dressings more frequently to try and control the drainage better. 09/08/2022: We have had success in controlling his drainage. The tissue maceration has improved significantly and his swelling has come down quite markedly. The medial right leg wound is much more superficial and has substantially less nonviable tissue present. The anterior tibial wound is a little bit larger, however JONHATAN, [name] ([id number]) [id number] Page 4 of 14 with some nonviable tissue present. 09/15/2022: The dorsal foot wound is nearly closed. The anterior tibial wound measures slightly larger today. The medial right leg wound continues to improve quite dramatically with less nonviable tissue and more robust-looking granulation. Edema control is significantly better. 09/22/2022: The left dorsal foot wound remains open, albeit quite tiny. The anterior tibial wound is a little bit larger again  today. The medial right leg wound continues to improve with an increase in robust and healthy granulation tissue and less accumulation of nonviable tissue. 09/29/2022: His  home health providers wrapped his left leg extremely tightly and he ended up having to cut the wrap off of his foot. This resulted in his foot being very swollen and that the dorsal foot wound is a little bit larger today. The anterior tibial wound is about the same size with a little slough on the surface. The medial right leg wound continues to fill in. There are very few areas with any significant depth. There is still slough accumulation. 3/13; Patient has a left dorsal foot wound along with a right anterior tibial wound and right medial wound. We are using silver alginate to the left dorsal foot wound, Santyl and sodium thiosulfate compound ointment to the right lower extremity wounds under 4 layer compression. Patient has no issues or complaints today. 10/13/2022: The left dorsal foot wound is a little bit bigger today, but his foot is also a little bit more swollen. The right medial leg wound has filled in more and has substantially less depth. There is still some nonviable tissue present. The small anterior lower leg wound looks about the same. 10/20/2022: The left dorsal foot wound is epithelializing. There does not appear to be much open at this site. There is some eschar around the edges. The right medial leg wound continues to fill in with granulation tissue. The small anterior lower leg wound is a little bit bigger and there is a chunk of calcium protruding. He continues to have fairly copious drainage from the right leg. 10/27/2022: His home health agency failed to show up at all this past week and as a result, the excessive drainage from his right leg wound has resulted in tissue breakdown. His leg is red and irritated. The wounds themselves look about the same. The wound on his left dorsal foot is nearly closed underneath some eschar. 11/03/2022: We continue to have difficulty with his home health agency. He continues to have significant drainage from his right leg wound which has  resulted in even further tissue breakdown. He also was not taking his Lasix as prescribed and I think much of the drainage has been his lymphedema fluid choosing the path of least resistance. The wound on his left dorsal foot is closed, but in the process of cleaning the site, dry skin was pulled off and he has a new small superficial wound just adjacent to where the dorsal foot wound was. 11/12/2022: His left dorsal foot is completely healed. The right medial leg continues to deteriorate. He is not taking his Lasix as prescribed, nor is he using his lymphedema pumps at all. As a result, he has massive amounts of drainage saturating his dressings and causing further tissue breakdown on his medial leg. Today, his dressings had a foul odor, as well. Electronic Signature(s) Signed: 11/12/2022 1:37:29 PM By: Duanne Guess MD FACS Entered By: Duanne Guess on 11/12/2022 13:37:29 -------------------------------------------------------------------------------- Physical Exam Details Patient Name: Date of Service: [name], [name] [name] 11/12/2022 12:30 PM Medical Record Number: [id number] Patient Account Number: [id number] Date of Birth/Sex: Treating RN: 07/17/1951 (72 y.o. M) Primary Care Provider: PA [name], NO Other Clinician: Referring Provider: Treating Provider/Extender: Luvenia Redden, Edwena Felty in Treatment: 39 Constitutional . . . . no acute distress. Respiratory Normal work of breathing on room air. Notes 11/12/2022: His left dorsal foot is completely healed. The right medial leg continues to deteriorate;  he has massive amounts of drainage saturating his dressings and causing further tissue breakdown on his medial leg. T oday, his dressings had a foul odor, as well. Electronic Signature(s) Signed: 11/12/2022 1:38:20 PM By: Duanne Guess MD FACS Entered By: Duanne Guess on 11/12/2022  13:38:20 -------------------------------------------------------------------------------- Physician Orders Details Patient Name: Date of Service: TYREKE, KAESER [name] 11/12/2022 12:30 PM Medical Record Number: [id number] Patient Account Number: [id number] Date of Birth/Sex: Treating RN: January 17, 1951 (72 y.o. [name] Primary Care Provider: PA [name], [place] Other Clinician: Referring Provider: Treating Provider/Extender: [name] in Treatment: [address] / Phone Orders: No [name], ALVIAR ([id number]) [id number] Page 5 of 14 Diagnosis Coding ICD-10 Coding Code Description [id number] Non-pressure chronic ulcer of other part of right lower leg with fat layer exposed L97.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) I10 Essential (primary) hypertension I25.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 - Dr. [name] Rm 1 Return A Friday 4/26 @ 12:30 pm Nurse Visit: - Tuesday 4/23 @ 11:15 am Anesthetic Wound #3 Right,Medial Lower Leg (In clinic) Topical Lidocaine 4% applied to wound bed Wound #6 Right,Anterior Lower Leg (In clinic) Topical Lidocaine 4% applied to wound bed Bathing/ Shower/ Hygiene May shower with protection but do not get wound dressing(s) wet. Protect dressing(s) with water repellant cover (for example, large plastic bag) or a cast cover and may then take shower. Edema Control - Lymphedema / SCD / Other Lymphedema Pumps. Use Lymphedema pumps on leg(s) 2-3 times a day for 45-60 minutes. If wearing any wraps or hose, do not remove them. Continue exercising as instructed. - use 1-2 times per day over wraps Elevate legs to the level  of the heart or above for 30 minutes daily and/or when sitting for 3-4 times a day throughout the day. - throughout the day Avoid standing for long periods of time. Exercise regularly Home Health Discontinue home health for wound care. - patient to be seen in wound clinic 2 times per week Other Home Health Orders/Instructions: - [facility] Wound Treatment Wound #3 - Lower Leg Wound Laterality: Right, Medial Peri-Wound Care: Triamcinolone 15 (g) 2 x Per Week/30 Days Discharge Instructions: Use triamcinolone 15 (g) as directed Peri-Wound Care: Zinc Oxide Ointment 30g tube 2 x Per Week/30 Days Discharge Instructions: Apply Zinc Oxide to excoriated periwound with each dressing change as needed Peri-Wound Care: Sween Lotion (Moisturizing lotion) 2 x Per Week/30 Days Discharge Instructions: Apply moisturizing lotion as directed Topical: Gentamicin 2 x Per Week/30 Days Discharge Instructions: mix 50:50 with mupirocin to wound bed Topical: Mupirocin Ointment 2 x Per Week/30 Days Discharge Instructions: Apply Mupirocin (Bactroban) as instructed Prim Dressing: Maxorb Extra CMC/Alginate Dressing, 4x4 (in/in) 2 x Per Week/30 Days ary Discharge Instructions: Apply to wound bed as instructed Secondary Dressing: ABD Pad, 8x10 (Generic) 2 x Per Week/30 Days Discharge Instructions: Apply over primary dressing as directed. Secondary Dressing: Drawtex 4x4 in 2 x Per Week/30 Days Discharge Instructions: Apply over primary dressing as directed. Secondary Dressing: Woven Gauze Sponge, Non-Sterile 4x4 in (Generic) 2 x Per Week/30 Days Discharge Instructions: Apply over primary dressing as directed. Secondary Dressing: Zetuvit Plus 4x8 in 2 x Per Week/30 Days Discharge Instructions: Apply over primary dressing as directed. Compression Wrap: Urgo K2, two layer compression system, regular 2 x Per  Week/30 Days Discharge Instructions: Apply Urgo K2 as directed (alternative to 4 layer compression). DENILSON, SALMINEN  ([id number]) [id number] Page 6 of 14 Wound #6 - Lower Leg Wound Laterality: Right, Anterior Peri-Wound Care: Triamcinolone 15 (g) 2 x Per Week/30 Days Discharge Instructions: Use triamcinolone 15 (g) as directed Peri-Wound Care: Zinc Oxide Ointment 30g tube 2 x Per Week/30 Days Discharge Instructions: Apply Zinc Oxide to excoriated periwound with each dressing change as needed Peri-Wound Care: Sween Lotion (Moisturizing lotion) 2 x Per Week/30 Days Discharge Instructions: Apply moisturizing lotion as directed Topical: Gentamicin 2 x Per Week/30 Days Discharge Instructions: mix 50:50 with mupirocin to wound bed Topical: Mupirocin Ointment 2 x Per Week/30 Days Discharge Instructions: Apply Mupirocin (Bactroban) as instructed Prim Dressing: Maxorb Extra CMC/Alginate Dressing, 4x4 (in/in) 2 x Per Week/30 Days ary Discharge Instructions: Apply to wound bed as instructed Secondary Dressing: Woven Gauze Sponge, Non-Sterile 4x4 in (Generic) 2 x Per Week/30 Days Discharge Instructions: Apply over primary dressing as directed. Secondary Dressing: Zetuvit Plus 4x8 in 2 x Per Week/30 Days Discharge Instructions: Apply over primary dressing as directed. Compression Wrap: Urgo K2, two layer compression system, regular 2 x Per Week/30 Days Discharge Instructions: Apply Urgo K2 as directed (alternative to 4 layer compression). Laboratory naerobe culture (MICRO) - right leg Bacteria identified in Unspecified specimen by A LOINC Code: 635-3 Convenience Name: Anaerobic culture Electronic Signature(s) Signed: 11/12/2022 1:59:44 PM By: Duanne Guess MD FACS Entered By: Duanne Guess on 11/12/2022 13:40:06 -------------------------------------------------------------------------------- Problem List Details Patient Name: Date of Service: TYGER, WICHMAN 11/12/2022 12:30 PM Medical Record Number: [id number] Patient Account Number: [id number] Date of Birth/Sex: Treating  RN: March 28, 1951 (72 y.o. 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) 02/09/2022 No Yes EMITT, MAGLIONE ([id number]) [id number] Page 7 of 14 I10 Essential (primary) hypertension 02/09/2022 No Yes I25.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 02/09/2022 No Yes I89.0 Lymphedema, not elsewhere classified 02/09/2022 No Yes Z86.718 Personal history of other venous thrombosis and embolism 02/09/2022 No Yes Inactive Problems ICD-10 Code Description Active Date Inactive Date L97.512 Non-pressure chronic ulcer of other part of right foot with fat layer exposed 02/17/2022 02/17/2022 Resolved Problems Electronic Signature(s) Signed: 11/12/2022 1:35:37 PM By: Duanne Guess MD FACS Entered By: Duanne Guess on 11/12/2022 13:35:37 -------------------------------------------------------------------------------- Progress Note Details Patient Name: Date of Service: [name] [name] [name] 11/12/2022 12:30 PM Medical Record Number: [id number] Patient Account Number: [id number] Date of Birth/Sex: Treating RN: January 17, 1951 (72 y.o. M) Primary Care Provider: PA [name], NO Other Clinician: Referring Provider: Treating Provider/Extender: [name] in Treatment: 39 Subjective Chief Complaint Information obtained from Patient Patient seen for complaints of Non-Healing Wounds. History of Present Illness  (HPI) ADMISSION 02/09/2022 This is a 72 year old man who recently moved to [place] from [place]. He has a history of of coronary artery disease and prior left popliteal vein DVT . He is not diabetic and does not smoke. 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 He has had multiple spinal surgeries and has limited mobility. He primarily uses a motorized wheelchair. He  has had lymphedema for many years and does have lymphedema pumps although he says he does not use them frequently. He has wounds on his bilateral lower extremities. He was receiving care in [place] for these prior to his move. They were apparently packing his wounds with Betadine soaked gauze. He has worn compression wraps in the past but not recently. He did say that they helped tremendously. In clinic today, his right ABI is noncompressible but has good signals; the left ABI was 1.17. No formal vascular studies have been performed. On his left dorsal foot, there is a circular lesion that exposes the fat layer. There is fairly heavy slough accumulation within the wound, but no significant odor. On his right medial calf, he has multiple pinholes that have slough buildup in them and probe for about 2 to 4 mm in depth. They are draining clear fluid. On his right lateral midfoot, he has ulceration consistent with venous stasis. It is geographic and has slough accumulation. 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 02/17/2022: All of the wounds are little bit larger today and have more slough accumulation. Today, the medial calf openings are larger and probe to something that feels calcified. There is odor coming from his wounds. His vascular studies are scheduled for August 9 and 10. 02-24-2022 upon evaluation today patient presents for follow-up concerning his ongoing issues here with his lower extremities. With that being said he does  have significant problems with what appears to be cellulitis unfortunately the PCR culture that was sent last week got crushed by [employer] in transit. With that being said we are going to reobtain a culture today although I am actually to do it on the right medial leg as this seems to be the most inflamed area today where there is some questionable drainage as well. I am going to remove some of the calcium deposits which are loosening obtain a deeper culture from this area. Fortunately I do not see any evidence of active infection systemically which is good news but locally I think we are having a bigger issue here. He does have his appointment next Thursday with vascular. Patient has also been referred to [facility] for further evaluation and treatment of the calcinosis for possible sulfate injections. 03/04/2022: The culture that was performed last week grew out multiple species including Klebsiella, Morganella, and Pseudomonas aeruginosa. He had been prescribed Bactrim but this was not adequate to cover all of the species and levofloxacin was recommended. He completed the Bactrim but did not pick up the levofloxacin and begin taking it until yesterday. 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 He had arterial studies performed today. He does have MARQUEE, FUCHS ([id number]) [id number] Page 8 of 14 evidence of peripheral arterial disease. Results copied here: +-------+-----------+-----------+------------+------------+ ABI/TBIT oday's ABIT oday's TBIPrevious ABIPrevious TBI +-------+-----------+-----------+------------+------------+ Right [place] 0.75    +-------+-----------+-----------+------------+------------+ Left [place] 0.57     +-------+-----------+-----------+------------+------------+ Arterial wall calcification precludes accurate ankle pressures and ABIs. Summary: Right: Resting right ankle-brachial index indicates noncompressible right lower extremity arteries. The right toe-brachial index is normal. Left: Resting left ankle-brachial index indicates noncompressible left lower extremity arteries. The left toe-brachial index is abnormal. He continues to have further tissue breakdown at the right medial calf and there is ample slough accumulation along with necrotic  subcutaneous tissue. The left dorsal foot wound has built up slough, as well. The right medial foot wound is nearly closed with just a light layer of eschar over the surface. The right dorsal lateral foot wound has also accumulated a fair amount of slough and remains quite tender. 03/10/2022: He has been taking the prescribed levofloxacin and has about 3 days left of this. The erythema and induration on his right leg has improved. He continues to experience further breakdown of the right medial calf wound. There is extensive slough and debris accumulation there. There is heavy slough on his left dorsal foot wound, but no odor or purulent drainage. The right medial foot wound appears to be closed. The right dorsal lateral foot wound also has a fair amount of slough but it is less tender than at our last visit. He still does not have an appointment with dermatology. 03/22/2022: The right anterior tibial wound has closed. The right lateral foot wound is nearly closed with just a little bit of slough. The left dorsal foot wound is smaller and continues to have some slough buildup but less so than at prior visits. There is good granulation tissue underlying the slough. Unfortunately the right medial calf wound continues to deteriorate. It has a foul odor today and a lot of necrotic tissue, the surrounding skin is also erythematous and moist. 03/30/2022: The right  lateral foot wound continues to contract and just has a little bit of slough and eschar present. The left dorsal foot wound is also smaller with slough overlying good granulation tissue. The right medial calf wound actually looks quite a bit better today; there is no odor and there is much less necrotic tissue although some remains present. We have been using topical gentamicin and he has been taking oral Augmentin. 04/07/2022: The patient saw dermatology at [facility] last week. Unfortunately, it appears that they did not read any of the documentation that was provided. 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 Overall, it was a highly unsatisfactory consultation. In the meantime, however, we were able to identify compounding pharmacy who could create a topical sodium thiosulfate ointment. The patient has that with him today. Both of the right foot wounds are now closed. The left dorsal foot wound has contracted but still has a layer of slough on the surface. The medial calf wounds on the right all look a little bit better. They still have heavy slough along with the chunks of calcium. Everything is stained purple. 04/14/2022: The wound on his dorsal left foot is a little bit smaller again today. There is still slough accumulation on the surface. The medial calf wounds have quite a bit less slough accumulation today. His right leg, however, is warm and erythematous, concerning for cellulitis. 04/14/2022: He completed his course of Augmentin yesterday. The medial calf wound sites are much cleaner today and shallower. There is less fragmented calcium in the wounds. He has a lot of lymphedema fluid-like drainage from these wounds, but no  purulent discharge or malodor. The wound on his dorsal left foot has also contracted and is shallower. There is a layer of slough over healthy granulation tissue. 05/05/2022: The left dorsal foot wound is smaller today with less slough and more granulation tissue. The  right medial calf wounds are shallower, but have extensive slough and some fat necrosis. The drainage has diminished considerably, however. 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 05/12/2022: The left dorsal foot wound continues to contract and fill with granulation tissue. There is slough on the wound surface. The right medial calf wounds also continue to fill with healthy tissue, but there is remaining slough and fat necrosis present. He had a significant increase in his drainage this week and it was a bright yellow-green color. He also had a new wound open over his right anterior tibial surface associated with the spike of cutaneous calcium. The leg is more red, as well, concerning for infection. 05/19/2022: His culture returned positive for Pseudomonas. He is currently taking a course of oral levofloxacin. We have also been using topical gentamicin mixed in with his sodium thiosulfate. All of the wounds look better this week. 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 There is slough and nonviable subcutaneous tissue still present. The new anterior tibial wound just has a light layer of slough. The dorsal foot wound also has some slough present and appears a little dry today. 05/26/2022: The right medial leg wound continues to improve. It is feeling with granulation tissue, but still accumulates a fairly thick layer of slough on the surface. The more recent anterior tibial wound has remained quite small, but also has some slough on the surface. The left dorsal foot wound has  contracted somewhat and has perimeter epithelialization. He completed his oral levofloxacin. 06/02/2022: The left dorsal foot wound continues to improve significantly. There is just a little slough on the wound surface. The right medial leg wound had black drainage, but it looks like silver alginate was applied last week and I theorized that silver sulfide was created with a combination of the silver alginate and the sodium thiosulfate. It did, however, have a bit of an odor to it and extensive slough accumulation. The small anterior tibial wound also had a bit of slough and nonviable subcutaneous tissue present. The skin of his leg was rather macerated, as well. 06/09/2022: The left dorsal foot wound is smaller again this week with just a light layer of slough on the surface. The right medial leg wound looks substantially better. The leg and periwound skin are no longer red and macerated. There is good granulation tissue forming with less slough and nonviable tissue. The anterior tibial wound just has a small amount of slough accumulation. 06/16/2022: The left dorsal foot wound continues to contract. His wrap slipped a little bit, however, and his foot is more swollen. The right medial leg wound continues to improve. The underlying tissue is more vital-appearing and there is less slough. He does have substantial drainage. The small anterior tibial wound has a bit of slough accumulation. 06/23/2022: The left dorsal foot wound is about half the size as it was last week. There is just some light slough on the surface and some eschar buildup around the edges. The right anterior leg wound is small with a little slough on the surface. Unfortunately, the right medial leg wound deteriorated fairly substantially. It is malodorous with gray/green/black discoloration. 06/30/2022: The left dorsal foot wound continues to contract. It is nearly closed with just a light layer of slough present. The right anterior leg  wound is about the same size and has a small amount  of slough on the surface. The right medial leg wound looks a lot better than it did last week. The odor has abated and the IKER, NUTTALL ([id number]) [id number] Page 9 of 14 tissue is less pulpy and necrotic-looking. There is still a fairly thick layer of slough present. 12/13; the left dorsal foot wound looks very healthy. There was no need to change the dressing here and no debridement. He has a small area on the right anterior lower leg apparently had not changed much in size. The area on the medial leg is the most problematic area this is large with some depth and a completely nonviable surface today. 07/14/2022: The left dorsal foot wound is smaller today with just a little slough and eschar present. The right anterior lower leg wound is about the same without any significant change. The medial leg wound has a black and green surface and is malodorous. No purulent drainage and the periwound skin is intact. Edema control is suboptimal. 07/21/2022: The dorsal foot wound continues to contract and is nearly closed with just a little bit of slough and eschar on the surface. The right anterior lower leg wound is shallower today but otherwise the dimensions are unchanged. The medial leg wound looks significantly better although it still has a nonviable surface; the odor and black/green surface has resolved. His culture came back with multiple species sensitive to Augmentin, which was prescribed and a very low level of Pseudomonas, which should be handled by the topical gentamicin we have been using. 07/28/2022: The dorsal foot wound is down to just a very tiny opening with a little eschar on the surface. The right anterior tibial wound is about the same with some slough buildup. The right medial leg wound looks quite a bit better this week. There is thick rubbery slough on the surface, but the odor and drainage has improved  significantly. 08/04/2022: The dorsal foot wound is almost healed, but the periwound skin has broken down. This appears to be secondary to moisture and adhesive from the absorbent pad. The right anterior tibial wound is shallower. The right medial leg wound continues to improve. It is smaller, still with rubbery slough on the surface. No malodor or purulent drainage. 08/11/2022: The dorsal foot wound is about the same size. It is a little bit dry with some slough accumulation. The right anterior tibial wound is also unchanged. The right medial leg wound is filling in further with good granulation tissue. Still with some slough buildup on the surface. 08/18/2022: His left leg is bright red, hot, and swollen with cellulitis. The dorsal foot wound actually looks quite good and is superficial with just a little bit of slough accumulation. The right anterior tibial wound is unchanged. The right medial leg wound has filled in with good granulation tissue but has copious amounts of drainage. 08/25/2022: His cellulitis has responded nicely to the oral antibiotics. His dorsal foot wound is smaller but has a fair amount of slough buildup. The right anterior tibial wound remains stable and unchanged. The right medial leg wound has more granulation tissue under a layer of slough, but continues to have copious drainage. The drainage is actually causing skin irritation and threatens to result in breakdown. 09/01/2022: He continues to have profuse drainage from his wounds which is resulting in tissue maceration on both the dorsum of his left foot and the periwound distal to his medial leg wound. The dorsal foot wound is nearly healed, despite this. The medial right leg wound is filling  in with good granulation tissue. We are working on getting home health assistance to change his dressings more frequently to try and control the drainage better. 09/08/2022: We have had success in controlling his drainage. The tissue  maceration has improved significantly and his swelling has come down quite markedly. The medial right leg wound is much more superficial and has substantially less nonviable tissue present. The anterior tibial wound is a little bit larger, however with some nonviable tissue present. 09/15/2022: The dorsal foot wound is nearly closed. The anterior tibial wound measures slightly larger today. The medial right leg wound continues to improve quite dramatically with less nonviable tissue and more robust-looking granulation. Edema control is significantly better. 09/22/2022: The left dorsal foot wound remains open, albeit quite tiny. The anterior tibial wound is a little bit larger again today. The medial right leg wound continues to improve with an increase in robust and healthy granulation tissue and less accumulation of nonviable tissue. 09/29/2022: His home health providers wrapped his left leg extremely tightly and he ended up having to cut the wrap off of his foot. This resulted in his foot being very swollen and that the dorsal foot wound is a little bit larger today. The anterior tibial wound is about the same size with a little slough on the surface. The medial right leg wound continues to fill in. There are very few areas with any significant depth. There is still slough accumulation. 3/13; Patient has a left dorsal foot wound along with a right anterior tibial wound and right medial wound. We are using silver alginate to the left dorsal foot wound, Santyl and sodium thiosulfate compound ointment to the right lower extremity wounds under 4 layer compression. Patient has no issues or complaints today. 10/13/2022: The left dorsal foot wound is a little bit bigger today, but his foot is also a little bit more swollen. The right medial leg wound has filled in more and has substantially less depth. There is still some nonviable tissue present. The small anterior lower leg wound looks about the  same. 10/20/2022: The left dorsal foot wound is epithelializing. There does not appear to be much open at this site. There is some eschar around the edges. The right medial leg wound continues to fill in with granulation tissue. The small anterior lower leg wound is a little bit bigger and there is a chunk of calcium protruding. He continues to have fairly copious drainage from the right leg. 10/27/2022: His home health agency failed to show up at all this past week and as a result, the excessive drainage from his right leg wound has resulted in tissue breakdown. His leg is red and irritated. The wounds themselves look about the same. The wound on his left dorsal foot is nearly closed underneath some eschar. 11/03/2022: We continue to have difficulty with his home health agency. He continues to have significant drainage from his right leg wound which has resulted in even further tissue breakdown. He also was not taking his Lasix as prescribed and I think much of the drainage has been his lymphedema fluid choosing the path of least resistance. The wound on his left dorsal foot is closed, but in the process of cleaning the site, dry skin was pulled off and he has a new small superficial wound just adjacent to where the dorsal foot wound was. 11/12/2022: His left dorsal foot is completely healed. The right medial leg continues to deteriorate. He is not taking his Lasix as prescribed, nor  is he using his lymphedema pumps at all. As a result, he has massive amounts of drainage saturating his dressings and causing further tissue breakdown on his medial leg. Today, his dressings had a foul odor, as well. Patient History Information obtained from Patient, Chart. Family History Cancer - Mother, Heart Disease - Father,Mother, Hypertension - Father,Siblings, Lung Disease - Siblings, No family history of Diabetes, Hereditary Spherocytosis, Kidney Disease, Seizures, Stroke, Thyroid Problems, Tuberculosis. Social  History Former smoker - quit 1991, Marital Status - Married, Alcohol Use - Moderate, Drug Use - No History, Caffeine Use - Daily - coffee. Medical History Eyes Patient has history of Cataracts - right eye removed Denies history of Glaucoma, Optic Neuritis Ear/Nose/Mouth/Throat GARREN, GREENMAN ([id number]) [id number] Page 10 of 14 Denies history of Chronic sinus problems/congestion, Middle ear problems Hematologic/Lymphatic Patient has history of Anemia - macrocytic, Lymphedema Cardiovascular Patient has history of Congestive Heart Failure, Deep Vein Thrombosis - left leg, Hypertension, Peripheral Venous Disease Endocrine Denies history of Type I Diabetes, Type II Diabetes Genitourinary Denies history of End Stage Renal Disease Integumentary (Skin) Denies history of History of Burn Oncologic Denies history of Received Chemotherapy, Received Radiation Psychiatric Denies history of Anorexia/bulimia, Confinement Anxiety Hospitalization/Surgery History - cervical fusion. - lumbar surgery. - coronary stent placement. - lasik eye surgery. - hydrocele excision. Medical A Surgical History Notes nd Constitutional Symptoms (General Health) obesity Ear/Nose/Mouth/Throat hard of hearing Respiratory frozen diaphragm right Cardiovascular hyperlipidemia Musculoskeletal myositis, lumbar DDD, spinal stenosis, cervical facet joint syndrome Objective Constitutional no acute distress. Vitals Time Taken: 12:41 PM, Height: 71 in, Weight: 267 lbs, BMI: 37.2, Temperature: 97.8 F, Pulse: 66 bpm, Respiratory Rate: 20 breaths/min, Blood Pressure: 138/55 mmHg. Respiratory Normal work of breathing on room air. General Notes: 11/12/2022: His left dorsal foot is completely healed. The right medial leg continues to deteriorate; he has massive amounts of drainage saturating his dressings and causing further tissue breakdown on his medial leg. Today, his dressings had a foul odor,  as well. Integumentary (Hair, Skin) Wound #1 status is Healed - Epithelialized. Original cause of wound was Gradually Appeared. The date acquired was: 12/07/2021. The wound has been in treatment 39 weeks. The wound is located on the Left,Dorsal Foot. The wound measures 0cm length x 0cm width x 0cm depth; 0cm^2 area and 0cm^3 volume. There is no tunneling or undermining noted. There is a none present amount of drainage noted. There is no granulation within the wound bed. There is no necrotic tissue within the wound bed. The periwound skin appearance had no abnormalities noted for texture. The periwound skin appearance exhibited: Dry/Scaly, Hemosiderin Staining. The periwound skin appearance did not exhibit: Maceration, Rubor, Erythema. Periwound temperature was noted as No Abnormality. The periwound has tenderness on palpation. Wound #3 status is Open. Original cause of wound was Gradually Appeared. The date acquired was: 12/07/2021. The wound has been in treatment 39 weeks. The wound is located on the Right,Medial Lower Leg. The wound measures 13.1cm length x 8.5cm width x 0.6cm depth; 87.454cm^2 area and 52.472cm^3 volume. There is Fat Layer (Subcutaneous Tissue) exposed. There is no tunneling or undermining noted. There is a large amount of serosanguineous drainage noted. The wound margin is distinct with the outline attached to the wound base. There is large (67-100%) red granulation within the wound bed. There is a small (1-33%) amount of necrotic tissue within the wound bed including Adherent Slough. The periwound skin appearance exhibited: Excoriation, Hemosiderin Staining, Erythema. The periwound skin appearance did not exhibit: Dry/Scaly, Maceration, Rubor.  The surrounding wound skin color is noted with erythema. Periwound temperature was noted as No Abnormality. The periwound has tenderness on palpation. Wound #6 status is Open. Original cause of wound was Gradually Appeared. The date acquired  was: 05/12/2022. The wound has been in treatment 26 weeks. The wound is located on the Right,Anterior Lower Leg. The wound measures 2.7cm length x 0.7cm width x 0.6cm depth; 1.484cm^2 area and 0.891cm^3 volume. There is Fat Layer (Subcutaneous Tissue) exposed. There is no tunneling or undermining noted. There is a medium amount of serosanguineous drainage noted. The wound margin is epibole. There is medium (34-66%) red granulation within the wound bed. There is a medium (34-66%) amount of necrotic tissue within the wound bed including Adherent Slough. The periwound skin appearance had no abnormalities noted for texture. The periwound skin appearance had no abnormalities noted for moisture. The periwound skin appearance exhibited: Hemosiderin Staining. The periwound skin appearance did not exhibit: Atrophie Blanche, Cyanosis, Ecchymosis, Mottled, Pallor, Rubor, Erythema. Periwound temperature was noted as No Abnormality. The periwound has tenderness on palpation. 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 There was a Excisional Skin/Subcutaneous Tissue Debridement with a total area of 59.5 sq cm performed by Duanne Guess, MD. With the following instrument(s): Curette to remove Viable and Non-Viable tissue/material. Material removed includes Subcutaneous Tissue and Slough and after achieving  pain control using Lidocaine 5% topical ointment. 1 specimen was taken by a Tissue Culture and sent to the lab per facility protocol. A time out was conducted at 13:05, prior to the start of the procedure. A Minimum amount of bleeding was controlled with Pressure. The procedure was tolerated well with a pain level of 4 throughout and a pain level of 2 following the procedure. Post Debridement Measurements: 13.1cm length x 8.5cm width x 0.6cm depth; 52.472cm^3 volume. Character of Wound/Ulcer Post Debridement requires further debridement. Post procedure Diagnosis Wound #3: Same as Pre-Procedure General Notes: Scribed for Dr. [name] by Zenaida Deed, RN. Pre-procedure diagnosis of Wound #3 is a Lymphedema located on the Right,Medial Lower Leg . There was a Double Layer Compression Therapy Procedure by Zenaida Deed, RN. Post procedure Diagnosis Wound #3: Same as Pre-Procedure Notes: URGO. Plan Follow-up Appointments: Return Appointment in 1 week. - Dr. [name] Rm 1 Friday 4/26 @ 12:30 pm Nurse Visit: - Tuesday 4/23 @ 11:15 am Anesthetic: Wound #3 Right,Medial Lower Leg: (In clinic) Topical Lidocaine 4% applied to wound bed Wound #6 Right,Anterior Lower Leg: (In clinic) Topical Lidocaine 4% applied to wound bed Bathing/ Shower/ Hygiene: May shower with protection but do not get wound dressing(s) wet. Protect dressing(s) with water repellant cover (for example, large plastic bag) or a cast cover and may then take shower. Edema Control - Lymphedema / SCD / Other: Lymphedema Pumps. Use Lymphedema pumps on leg(s) 2-3 times a day for 45-60 minutes. If wearing any wraps or hose, do not remove them. Continue exercising as instructed. - use 1-2 times per day over wraps Elevate legs to the level of the heart or above for 30  minutes daily and/or when sitting for 3-4 times a day throughout the day. - throughout the day Avoid standing for long periods of time. Exercise regularly Home  Health: Discontinue home health for wound care. - patient to be seen in wound clinic 2 times per week Other Home Health Orders/Instructions: - [facility] Laboratory ordered were: Anaerobic culture - right leg WOUND #3: - Lower Leg Wound Laterality: Right, Medial Peri-Wound Care: Triamcinolone 15 (g) 2 x Per Week/30 Days Discharge Instructions: Use triamcinolone 15 (g) as directed Peri-Wound Care: Zinc Oxide Ointment 30g tube 2 x Per Week/30 Days Discharge Instructions: Apply Zinc Oxide to excoriated periwound with each dressing change as needed Peri-Wound Care: Sween Lotion (Moisturizing lotion) 2 x Per Week/30 Days Discharge Instructions: Apply moisturizing lotion as directed Topical: Gentamicin 2 x Per Week/30 Days Discharge Instructions: mix 50:50 with mupirocin to wound bed Topical: Mupirocin Ointment 2 x Per Week/30 Days Discharge Instructions: Apply Mupirocin (Bactroban) as instructed Prim Dressing: Maxorb Extra CMC/Alginate Dressing, 4x4 (in/in) 2 x Per Week/30 Days ary Discharge Instructions: Apply to wound bed as instructed Secondary Dressing: ABD Pad, 8x10 (Generic) 2 x Per Week/30 Days Discharge Instructions: Apply over primary dressing as directed. Secondary Dressing: Drawtex 4x4 in 2 x Per Week/30 Days Discharge Instructions: Apply over primary dressing as directed. Secondary Dressing: Woven Gauze Sponge, Non-Sterile 4x4 in (Generic) 2 x Per Week/30 Days Discharge Instructions: Apply over primary dressing as directed. Secondary Dressing: Zetuvit Plus 4x8 in 2 x Per Week/30 Days Discharge Instructions: Apply over primary dressing as directed. Com pression Wrap: Urgo K2, two layer compression system, regular 2 x Per Week/30 Days Discharge Instructions: Apply Urgo K2 as directed (alternative to 4 layer compression). WOUND #6: - Lower Leg Wound Laterality: Right, Anterior Peri-Wound Care: Triamcinolone 15 (g) 2 x Per Week/30 Days Discharge Instructions: Use triamcinolone 15 (g)  as directed Peri-Wound Care: Zinc Oxide Ointment 30g tube 2 x Per Week/30 Days Discharge Instructions: Apply Zinc Oxide to excoriated periwound with each dressing change as needed Peri-Wound Care: Sween Lotion (Moisturizing lotion) 2 x Per Week/30 Days Discharge Instructions: Apply moisturizing lotion as directed Topical: Gentamicin 2 x Per Week/30 Days RONNEL, [name] ([id number]) [id number] Page 12 of 14 Discharge Instructions: mix 50:50 with mupirocin to wound bed Topical: Mupirocin Ointment 2 x Per Week/30 Days Discharge Instructions: Apply Mupirocin (Bactroban) as instructed Prim Dressing: Maxorb Extra CMC/Alginate Dressing, 4x4 (in/in) 2 x Per Week/30 Days ary Discharge Instructions: Apply to wound bed as instructed Secondary Dressing: Woven Gauze Sponge, Non-Sterile 4x4 in (Generic) 2 x Per Week/30 Days Discharge Instructions: Apply over primary dressing as directed. Secondary Dressing: Zetuvit Plus 4x8 in 2 x Per Week/30 Days Discharge Instructions: Apply over primary dressing as directed. Com pression Wrap: Urgo K2, two layer compression system, regular 2 x Per Week/30 Days Discharge Instructions: Apply Urgo K2 as directed (alternative to 4 layer compression). 11/12/2022: His left dorsal foot is completely healed. The right medial leg continues to deteriorate. He is not taking his Lasix as prescribed, nor is he using his lymphedema pumps at all. As a result, he has massive amounts of drainage saturating his dressings and causing further tissue breakdown on his medial leg. Today, his dressings had a foul odor, as well. I used a curette to debride slough and subcutaneous tissue from his medial leg wound. I debrided slough from the small anterior leg wound. I then took a culture due to the foul odor and ongoing deterioration. We will apply a mixture of topical gentamicin and mupirocin  to the wound while we are awaiting culture data. I suspect I will need to put  him on systemic antibiotics once I have those data available. Will apply silver alginate and 4-layer compression. He will wear his juxta lite stocking on the left leg. He will have a nurse visit early in the week to change his dressing and follow-up with me next Friday. Electronic Signature(s) Signed: 11/12/2022 1:43:25 PM By: Duanne Guess MD FACS Entered By: Duanne Guess on 11/12/2022 13:43:24 -------------------------------------------------------------------------------- HxROS Details Patient Name: Date of Service: ROMMIE, DUNN [name] 11/12/2022 12:30 PM Medical Record Number: [id number] Patient Account Number: [id number] Date of Birth/Sex: Treating RN: [birth year] (72 y.o. M) Primary Care Provider: PA [name], NO Other Clinician: Referring Provider: Treating Provider/Extender: [name] in Treatment: 39 Information Obtained From Patient Chart Constitutional Symptoms (General Health) Medical History: Past Medical History Notes: obesity Eyes Medical History: Positive for: Cataracts - right eye removed Negative for: Glaucoma; Optic Neuritis Ear/Nose/Mouth/Throat Medical History: Negative for: Chronic sinus problems/congestion; Middle ear problems Past Medical History Notes: hard of hearing Hematologic/Lymphatic Medical History: Positive for: Anemia - macrocytic; Lymphedema Respiratory Medical History: Past Medical History Notes: frozen diaphragm right Cardiovascular Medical History: Positive for: Congestive Heart Failure; Deep Vein Thrombosis - left leg; Hypertension; Peripheral Venous Disease Past Medical History Notes: hyperlipidemia NEILAN, [name] ([id number]) [id number] Page 13 of 14 Endocrine Medical History: Negative for: Type I Diabetes; Type II Diabetes Genitourinary Medical History: Negative for: End Stage Renal Disease Integumentary (Skin) Medical History: Negative for: History of  Burn Musculoskeletal Medical History: Past Medical History Notes: myositis, lumbar DDD, spinal stenosis, cervical facet joint syndrome Oncologic Medical History: Negative for: Received Chemotherapy; Received Radiation Psychiatric Medical History: Negative for: Anorexia/bulimia; Confinement Anxiety HBO Extended History Items Eyes: Cataracts Immunizations Pneumococcal Vaccine: Received Pneumococcal Vaccination: Yes Received Pneumococcal Vaccination On or After 60th Birthday: Yes Implantable Devices No devices added Hospitalization / Surgery History Type of Hospitalization/Surgery cervical fusion lumbar surgery coronary stent placement lasik eye surgery hydrocele excision Family and Social History Cancer: Yes - Mother; Diabetes: No; Heart Disease: Yes - Father,Mother; Hereditary Spherocytosis: No; Hypertension: Yes - Father,Siblings; Kidney Disease: No; Lung Disease: Yes - Siblings; Seizures: No; Stroke: No; Thyroid Problems: No; Tuberculosis: No; Former smoker - quit 1991; Marital Status - Married; Alcohol Use: Moderate; Drug Use: No History; Caffeine Use: Daily - coffee; Financial Concerns: No; Food, Clothing or Shelter Needs: No; Support System Lacking: No; Transportation Concerns: No Electronic Signature(s) Signed: 11/12/2022 1:59:44 PM By: Duanne Guess MD FACS Entered By: Duanne Guess on 11/12/2022 13:37:36 -------------------------------------------------------------------------------- SuperBill Details Patient Name: Date of Service: [name], [name] 11/12/2022 Medical Record Number: [id number] Patient Account Number: [id number] Date of Birth/Sex: Treating RN: Feb 03, 1951 (72 y.o. M) Primary Care Provider: PA Fidela [place] Other Clinician: Referring Provider: Treating Provider/Extender: [name] in Treatment: [address] [place] ([id number]) [id number] Page 14 of 14 Diagnosis Coding ICD-10 Codes Code  Description [phone] Non-pressure chronic ulcer of other part of right lower leg with fat layer exposed L97.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) I10 Essential (primary) hypertension I25.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 angina pectoris I89.0 Lymphedema, not elsewhere classified Z86.718 Personal history of other venous thrombosis and embolism Facility Procedures : CPT4 Code: [id number] Description: 11042 - DEB SUBQ TISSUE 20 SQ CM/< ICD-10 Diagnosis Description L97.812 Non-pressure chronic ulcer of other part of right lower leg with fat layer exp Modifier:  osed Quantity: 1 : CPT4 Code: [id number] Description: 11045 - DEB SUBQ TISS EA ADDL 20CM ICD-10 Diagnosis Description L97.812 Non-pressure chronic ulcer of other part of right lower leg with fat layer exp Modifier: osed Quantity: 2 Physician Procedures : CPT4 Code Description Modifier [id number] 99214 - WC PHYS LEVEL 4 - EST PT 25 ICD-10 Diagnosis Description 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) I89.0 Lymphedema, not elsewhere classified Quantity: 1 : [id number] 11042 - WC PHYS SUBQ TISS 20 SQ CM ICD-10 Diagnosis Description 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ed Quantity: 1 : [id number] 11045 - WC PHYS SUBQ TISS EA ADDL 20 CM ICD-10 Diagnosis Description 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ed Quantity: 2 Electronic Signature(s) Signed: 11/12/2022 1:43:47 PM By: Duanne Guess MD FACS Entered By: Duanne Guess on 11/12/2022 13:43:47

## 2022-11-13 NOTE — Progress Notes (Signed)
[name] ([id number]) [id number] Page 1 of 9 Visit Report for 11/12/2022 Arrival Information Details Patient Name: Date of Service: [name], [name] 11/12/2022 12:30 PM Medical Record Number: [id number] Patient Account Number: [id number] Date of Birth/Sex: Treating RN: 06-Jan-1951 (72 y.o. [name] Primary Care Lamonta Cypress: PA [name], [place] Other Clinician: Referring Alisen Marsiglia: Treating Zaiya Annunziato/Extender: [name] in Treatment: 39 Visit Information History Since Last Visit Added or deleted any medications: No Patient Arrived: Wheel Chair Any new allergies or adverse reactions: No Arrival Time: 12:41 Had a fall or experienced change in No Accompanied By: self activities of daily living that may affect Transfer Assistance: None risk of falls: Patient Identification Verified: Yes Signs or symptoms of abuse/neglect since last visito No Secondary Verification Process Completed: Yes Hospitalized since last visit: No Patient Requires Transmission-Based Precautions: No Implantable device outside of the clinic excluding No Patient Has Alerts: Yes cellular tissue based products placed in the center Patient Alerts: R ABI= N/C, TBI= .75 since last visit: L ABI =N/C, TBI = .57 Has Dressing in Place as Prescribed: Yes Has Compression in Place as Prescribed: Yes Pain Present Now: No Electronic Signature(s) Signed: 11/12/2022 5:46:14 PM By: Zenaida Deed RN, BSN Entered By: Zenaida Deed on 11/12/2022 12:41:56 -------------------------------------------------------------------------------- Compression Therapy Details Patient Name: Date of Service: [name], [name] 11/12/2022 12:30 PM Medical Record Number: [id number] Patient Account Number: [id number] Date of Birth/Sex: Treating RN: 07/26/1951 (73 y.o. [name] Primary Care Telma Pyeatt: PA [name], [place] Other Clinician: Referring Zaryiah Barz: Treating Dolan Xia/Extender: [name] in Treatment: 39 Compression Therapy Performed for Wound Assessment: Wound #3 Right,Medial Lower Leg Performed By: Clinician Zenaida Deed, RN Compression Type: Double Layer Post Procedure Diagnosis Same as Pre-procedure Notes [facility] Electronic Signature(s) Signed: 11/12/2022 5:46:14 PM By: Zenaida Deed RN, BSN Entered By: Zenaida Deed on 11/12/2022 13:03:56 -------------------------------------------------------------------------------- Lower Extremity Assessment Details Patient Name: Date of Service: [name] 11/12/2022 12:30 PM Medical Record Number: [id number] Patient Account Number: [id number] Date of Birth/Sex: Treating RN: 1951/03/18 (72 y.o. [name] Primary Care Duyen Beckom: PA [name], [place] Other Clinician: Referring Aolani Piggott: Treating Jabier Deese/Extender: [name] Weeks in Treatment: 39 Edema Assessment B[Left: ED, [name]] Franne Forts: [id number] Page 2 of 9] Assessed: [Left: No] [Right: No] Edema: [Left: Yes] [Right: Yes] Calf Left: Right: Point of Measurement: From Medial Instep 40 cm 40 cm Ankle Left: Right: Point of Measurement: From Medial Instep 23 cm 27 cm Vascular Assessment Pulses: Dorsalis Pedis Palpable: [Left:No] [Right:Yes] Electronic Signature(s) Signed: 11/12/2022 5:46:14 PM By: Zenaida Deed RN, BSN Entered By: Zenaida Deed on 11/12/2022 12:48:02 -------------------------------------------------------------------------------- Multi Wound Chart Details Patient Name: Date of Service: [name] 11/12/2022 12:30 PM Medical Record Number: [id number] Patient Account Number: [id number] Date of Birth/Sex: Treating RN: 29-Mar-1951 (72 y.o. M) Primary Care Deetta Siegmann: PA [name], [place] Other Clinician: Referring Shereka Lafortune: Treating Kadarius Cuffe/Extender: Luvenia Redden, Edwena Felty in Treatment: 39 Vital Signs Height(in): 71 Pulse(bpm): 66 Weight(lbs):  267 Blood Pressure(mmHg): 138/55 Body Mass Index(BMI): 37.2 Temperature(F): 97.8 Respiratory Rate(breaths/min): 20 [1:Photos:] Left, Dorsal Foot Right, Medial Lower Leg Right, Anterior Lower Leg Wound Location: Gradually Appeared Gradually Appeared Gradually Appeared Wounding Event: Lymphedema Lymphedema Calcinosis Primary Etiology: Cataracts, Anemia, Lymphedema, Cataracts, Anemia, Lymphedema, Cataracts, Anemia, Lymphedema, Comorbid History: Congestive Heart Failure, Deep Vein Congestive Heart Failure, Deep Vein Congestive Heart Failure, Deep Vein Thrombosis, Hypertension, Peripheral Thrombosis, Hypertension, Peripheral Thrombosis, Hypertension, Peripheral Venous Disease Venous Disease Venous Disease 12/07/2021 12/07/2021 05/12/2022 Date Acquired: 39 39 26 Weeks of Treatment: Healed -  Epithelialized Open Open Wound Status: No No No Wound Recurrence: No No Yes Clustered Wound: N/A N/A 2 Clustered Quantity: 0x0x0 13.1x8.5x0.6 2.7x0.7x0.6 Measurements L x W x D (cm) 0 87.454 1.484 A (cm) : rea 0 52.472 0.891 Volume (cm) : 100.00% -1537.40% -528.80% % Reduction in Area: 100.00% -2356.60% -3612.50% % Reduction in Volume: Full Thickness Without Exposed Full Thickness With Exposed Support Full Thickness Without Exposed Classification: Support Structures Structures Support Structures None Present Large Medium Exudate Amount: N/A Serosanguineous Serosanguineous Exudate Type: N/A red, brown red, brown Exudate Color: N/A Distinct, outline attached Epibole Wound MarginJEFTE, [name] ([id number]) [id number] Page 3 of 9 None Present (0%) Large (67-100%) Medium (34-66%) Granulation Amount: N/A Red Red Granulation Quality: None Present (0%) Small (1-33%) Medium (34-66%) Necrotic Amount: Fascia: No Fat Layer (Subcutaneous Tissue): Yes Fat Layer (Subcutaneous Tissue): Yes Exposed Structures: Fat Layer (Subcutaneous Tissue): No Fascia:  No Fascia: No Tendon: No Tendon: No Tendon: No Muscle: No Muscle: No Muscle: No Joint: No Joint: No Joint: No Bone: No Bone: No Bone: No Large (67-100%) Small (1-33%) Small (1-33%) Epithelialization: N/A Debridement - Excisional N/A Debridement: Pre-procedure Verification/Time Out N/A 13:05 N/A Taken: N/A Lidocaine 5% topical ointment N/A Pain Control: N/A Subcutaneous, Slough N/A Tissue Debrided: N/A Skin/Subcutaneous Tissue N/A Level: N/A 59.5 N/A Debridement A (sq cm): rea N/A Curette N/A Instrument: N/A Minimum N/A Bleeding: N/A Pressure N/A Hemostasis A chieved: N/A 4 N/A Procedural Pain: N/A 2 N/A Post Procedural Pain: N/A Procedure was tolerated well N/A Debridement Treatment Response: N/A 13.1x8.5x0.6 N/A Post Debridement Measurements L x W x D (cm) N/A 52.472 N/A Post Debridement Volume: (cm) Excoriation: No Excoriation: Yes Excoriation: No Periwound Skin Texture: Induration: No Callus: No Crepitus: No Rash: No Scarring: No Dry/Scaly: Yes Maceration: No Maceration: No Periwound Skin Moisture: Maceration: No Dry/Scaly: No Dry/Scaly: No Hemosiderin Staining: Yes Erythema: Yes Hemosiderin Staining: Yes Periwound Skin Color: Erythema: No Hemosiderin Staining: Yes Atrophie Blanche: No Rubor: No Rubor: No Cyanosis: No Ecchymosis: No Erythema: No Mottled: No Pallor: No Rubor: No N/A Increased N/A Erythema Change: No Abnormality No Abnormality No Abnormality Temperature: Yes Yes Yes Tenderness on Palpation: N/A Compression Therapy N/A Procedures Performed: Debridement Treatment Notes Electronic Signature(s) Signed: 11/12/2022 1:35:48 PM By: Duanne Guess MD FACS Entered By: Duanne Guess on 11/12/2022 13:35:47 -------------------------------------------------------------------------------- Multi-Disciplinary Care Plan Details Patient Name: Date of Service: MELITON, SAMAD 11/12/2022 12:30 PM Medical Record Number:  [id number] Patient Account Number: [id number] Date of Birth/Sex: Treating RN: 1951-06-25 (72 y.o. [name] Primary Care Eufemia Prindle: PA [name], [place] Other Clinician: Referring [name] Waldschmidt: Treating Ty Oshima/Extender: [name] in Treatment: 39 Multidisciplinary Care Plan reviewed with physician Active Inactive Venous Leg Ulcer Nursing Diagnoses: Knowledge deficit related to disease process and management Potential for venous Insuffiency (use before diagnosis confirmed) Goals: Patient will maintain optimal edema control Date Initiated: 02/17/2022 Target Resolution Date: 11/24/2022 JARRYN, ALTLAND ([id number]) [id number] Page 4 of 9 Goal Status: Active Interventions: Assess peripheral edema status every visit. Compression as ordered Provide education on venous insufficiency Treatment Activities: Non-invasive vascular studies : 02/17/2022 Therapeutic compression applied : 02/17/2022 Notes: Wound/Skin Impairment Nursing Diagnoses: Impaired tissue integrity Knowledge deficit related to ulceration/compromised skin integrity Goals: Patient/caregiver will verbalize understanding of skin care regimen Date Initiated: 02/17/2022 Target Resolution Date: 11/24/2022 Goal Status: Active Ulcer/skin breakdown will have a volume reduction of 30% by week 4 Date Initiated: 02/17/2022 Date Inactivated: 03/10/2022 Target Resolution Date: 03/10/2022 Unmet Reason: infection being treated, Goal Status: Unmet waiting on derm appte Ulcer/skin  breakdown will have a volume reduction of 50% by week 8 Date Initiated: 03/10/2022 Date Inactivated: 04/14/2022 Target Resolution Date: 04/07/2022 Goal Status: Unmet Unmet Reason: calcinosis Ulcer/skin breakdown will have a volume reduction of 80% by week 12 Date Initiated: 04/14/2022 Date Inactivated: 05/05/2022 Target Resolution Date: 05/05/2022 Unmet Reason: infection, chronic Goal Status: Unmet  condition Interventions: Assess patient/caregiver ability to obtain necessary supplies Assess patient/caregiver ability to perform ulcer/skin care regimen upon admission and as needed Assess ulceration(s) every visit Provide education on ulcer and skin care Treatment Activities: Skin care regimen initiated : 02/17/2022 Topical wound management initiated : 02/17/2022 Notes: Electronic Signature(s) Signed: 11/12/2022 5:46:14 PM By: Zenaida Deed RN, BSN Entered By: Zenaida Deed on 11/12/2022 13:00:53 -------------------------------------------------------------------------------- Pain Assessment Details Patient Name: Date of Service: [name], [name] 11/12/2022 12:30 PM Medical Record Number: [id number] Patient Account Number: [id number] Date of Birth/Sex: Treating RN: [birth year] (72 y.o. [name] Primary Care Benedict Kue: PA [name], [place] Other Clinician: Referring Lilliane Sposito: Treating Rogue Pautler/Extender: [name] in Treatment: 39 Active Problems Location of Pain Severity and Description of Pain Patient Has Paino No Site Locations Rate the pain. RUSTY, VILLELLA ([id number]) [id number] Page 5 of 9 Rate the pain. Current Pain Level: 0 Character of Pain Describe the Pain: Tender Pain Management and Medication Current Pain Management: Electronic Signature(s) Signed: 11/12/2022 5:46:14 PM By: Zenaida Deed RN, BSN Entered By: Zenaida Deed on 11/12/2022 12:42:28 -------------------------------------------------------------------------------- Patient/Caregiver Education Details Patient Name: Date of Service: KAVAUGHN, FAUCETT 4/19/2024andnbsp12:30 PM Medical Record Number: [id number] Patient Account Number: [id number] Date of Birth/Gender: Treating RN: 27-Sep-1950 (72 y.o. [name] Primary Care Physician: PA [name], [place] Other Clinician: Referring Physician: Treating Physician/Extender: [name] in  Treatment: 1 Education Assessment Education Provided To: Patient Education Topics Provided Infection: Methods: Explain/Verbal Responses: Reinforcements needed, State content correctly Venous: Methods: Explain/Verbal Responses: Reinforcements needed, State content correctly Wound/Skin Impairment: Methods: Explain/Verbal Responses: Reinforcements needed, State content correctly Electronic Signature(s) Signed: 11/12/2022 5:46:14 PM By: Zenaida Deed RN, BSN Entered By: Zenaida Deed on 11/12/2022 13:01:25 -------------------------------------------------------------------------------- Wound Assessment Details Patient Name: Date of Service: [name], [name] 11/12/2022 12:30 PM Medical Record Number: [id number] Patient Account Number: [id number] Date of Birth/Sex: Treating RN: 12-28-1950 (72 y.o. [name] [place], Maud ([id number]) [id number] Page 6 of 9 Primary Care Tyshawn Ciullo: PA [name], [place] Other Clinician: Referring Ebbie Sorenson: Treating Leslyn Monda/Extender: [name] in Treatment: 39 Wound Status Wound Number: 1 Primary Lymphedema Etiology: Wound Location: Left, Dorsal Foot Wound Healed - Epithelialized Wounding Event: Gradually Appeared Status: Date Acquired: 12/07/2021 Comorbid Cataracts, Anemia, Lymphedema, Congestive Heart Failure, Deep Weeks Of Treatment: 39 History: Vein Thrombosis, Hypertension, Peripheral Venous Disease Clustered Wound: No Photos Wound Measurements Length: (cm) Width: (cm) Depth: (cm) Area: (cm) Volume: (cm) 0 % Reduction in Area: 100% 0 % Reduction in Volume: 100% 0 Epithelialization: Large (67-100%) 0 Tunneling: No 0 Undermining: No Wound Description Classification: Full Thickness Without Exposed Support Exudate Amount: None Present Structures Foul Odor After Cleansing: No Slough/Fibrino No Wound Bed Granulation Amount: None Present (0%) Exposed Structure Necrotic Amount: None  Present (0%) Fascia Exposed: No Fat Layer (Subcutaneous Tissue) Exposed: No Tendon Exposed: No Muscle Exposed: No Joint Exposed: No Bone Exposed: No Periwound Skin Texture Texture Color No Abnormalities Noted: Yes No Abnormalities Noted: No Erythema: No Moisture Hemosiderin Staining: Yes No Abnormalities Noted: No Rubor: No Dry / Scaly: Yes Maceration: No Temperature / Pain Temperature: No Abnormality Tenderness on Palpation: Yes Electronic Signature(s) Signed: 11/12/2022 5:46:14 PM By:  Zenaida Deed RN, BSN Entered By: Zenaida Deed on 11/12/2022 12:58:21 -------------------------------------------------------------------------------- Wound Assessment Details Patient Name: Date of Service: [name], [name] 11/12/2022 12:30 PM Medical Record Number: [id number] Patient Account Number: [id number] Date of Birth/Sex: Treating RN: 09-Oct-1950 (72 y.o. [name] Primary Care Isobel Eisenhuth: PA [name], [place] Other Clinician: Referring Cozette Braggs: Treating Fortunato Nordin/Extender: [name] in Treatment: [address], [place] ([id number]) [id number] Page 7 of 9 Wound Status Wound Number: 3 Primary Lymphedema Etiology: Wound Location: Right, Medial Lower Leg Wound Open Wounding Event: Gradually Appeared Status: Date Acquired: 12/07/2021 Comorbid Cataracts, Anemia, Lymphedema, Congestive Heart Failure, Deep Weeks Of Treatment: 39 History: Vein Thrombosis, Hypertension, Peripheral Venous Disease Clustered Wound: No Photos Wound Measurements Length: (cm) 13.1 Width: (cm) 8.5 Depth: (cm) 0.6 Area: (cm) 87.454 Volume: (cm) 52.472 % Reduction in Area: -1537.4% % Reduction in Volume: -2356.6% Epithelialization: Small (1-33%) Tunneling: No Undermining: No Wound Description Classification: Full Thickness With Exposed Support Structures Wound Margin: Distinct, outline attached Exudate Amount: Large Exudate Type: Serosanguineous Exudate  Color: red, brown Foul Odor After Cleansing: No Slough/Fibrino Yes Wound Bed Granulation Amount: Large (67-100%) Exposed Structure Granulation Quality: Red Fascia Exposed: No Necrotic Amount: Small (1-33%) Fat Layer (Subcutaneous Tissue) Exposed: Yes Necrotic Quality: Adherent Slough Tendon Exposed: No Muscle Exposed: No Joint Exposed: No Bone Exposed: No Periwound Skin Texture Texture Color No Abnormalities Noted: No No Abnormalities Noted: No Excoriation: Yes Erythema: Yes Erythema Change: Increased Moisture Hemosiderin Staining: Yes No Abnormalities Noted: No Rubor: No Dry / Scaly: No Maceration: No Temperature / Pain Temperature: No Abnormality Tenderness on Palpation: Yes Electronic Signature(s) Signed: 11/12/2022 5:46:14 PM By: Zenaida Deed RN, BSN Entered By: Zenaida Deed on 11/12/2022 13:00:19 -------------------------------------------------------------------------------- Wound Assessment Details Patient Name: Date of Service: [name], [name] 11/12/2022 12:30 PM Medical Record Number: [id number] Patient Account Number: [id number] Date of Birth/Sex: Treating RN: 02-14-1951 (72 y.o. [name] Primary Care Elinor Kleine: PA [name], [place] Other Clinician: Referring Markan Cazarez: Treating Adarius Tigges/Extender: [name] in Treatment: [address] [place] ([id number]) [id number] Page 8 of 9 Wound Status Wound Number: 6 Primary Calcinosis Etiology: Wound Location: Right, Anterior Lower Leg Wound Open Wounding Event: Gradually Appeared Status: Date Acquired: 05/12/2022 Comorbid Cataracts, Anemia, Lymphedema, Congestive Heart Failure, Deep Weeks Of Treatment: 26 History: Vein Thrombosis, Hypertension, Peripheral Venous Disease Clustered Wound: Yes Photos Wound Measurements Length: (cm) Width: (cm) Depth: (cm) Clustered Quantity: Area: (cm) Volume: (cm) 2.7 % Reduction in Area: -528.8% 0.7 % Reduction in  Volume: -3612.5% 0.6 Epithelialization: Small (1-33%) 2 Tunneling: No 1.484 Undermining: No 0.891 Wound Description Classification: Full Thickness Without Exposed Sup Wound Margin: Epibole Exudate Amount: Medium Exudate Type: Serosanguineous Exudate Color: red, brown port Structures Foul Odor After Cleansing: No Slough/Fibrino Yes Wound Bed Granulation Amount: Medium (34-66%) Exposed Structure Granulation Quality: Red Fascia Exposed: No Necrotic Amount: Medium (34-66%) Fat Layer (Subcutaneous Tissue) Exposed: Yes Necrotic Quality: Adherent Slough Tendon Exposed: No Muscle Exposed: No Joint Exposed: No Bone Exposed: No Periwound Skin Texture Texture Color No Abnormalities Noted: Yes No Abnormalities Noted: No Atrophie Blanche: No Moisture Cyanosis: No No Abnormalities Noted: Yes Ecchymosis: No Erythema: No Hemosiderin Staining: Yes Mottled: No Pallor: No Rubor: No Temperature / Pain Temperature: No Abnormality Tenderness on Palpation: Yes Electronic Signature(s) Signed: 11/12/2022 5:46:14 PM By: Zenaida Deed RN, BSN Entered By: Zenaida Deed on 11/12/2022 12:57:44 -------------------------------------------------------------------------------- Vitals Details Patient Name: Date of Service: [name] 11/12/2022 12:30 PM Medical Record Number: [id number] Patient Account Number: [id number] [name] ([id number]) [id number] Page 9 of  9 Date of Birth/Sex: Treating RN: 01/23/1951 (72 y.o. [name] Primary Care Anndrea Mihelich: Other Clinician: PA Fidela Juneau Referring Olanrewaju Osborn: Treating Thelmer Legler/Extender: [name] in Treatment: 39 Vital Signs Time Taken: 12:41 Temperature (F): 97.8 Height (in): 71 Pulse (bpm): 66 Weight (lbs): 267 Respiratory Rate (breaths/min): 20 Body Mass Index (BMI): 37.2 Blood Pressure (mmHg): 138/55 Reference Range: 80 - 120 mg / dl Electronic Signature(s) Signed:  11/12/2022 5:46:14 PM By: Zenaida Deed RN, BSN Entered By: Zenaida Deed on 11/12/2022 12:42:16

## 2022-11-16 ENCOUNTER — Encounter: Payer: Self-pay | Admitting: Internal Medicine

## 2022-11-16 ENCOUNTER — Encounter (HOSPITAL_BASED_OUTPATIENT_CLINIC_OR_DEPARTMENT_OTHER): Payer: Medicare Other | Admitting: General Surgery

## 2022-11-16 DIAGNOSIS — I10 Essential (primary) hypertension: Secondary | ICD-10-CM | POA: Diagnosis not present

## 2022-11-16 DIAGNOSIS — L97812 Non-pressure chronic ulcer of other part of right lower leg with fat layer exposed: Secondary | ICD-10-CM | POA: Diagnosis not present

## 2022-11-16 DIAGNOSIS — L97522 Non-pressure chronic ulcer of other part of left foot with fat layer exposed: Secondary | ICD-10-CM | POA: Diagnosis not present

## 2022-11-16 DIAGNOSIS — L942 Calcinosis cutis: Secondary | ICD-10-CM | POA: Diagnosis not present

## 2022-11-16 DIAGNOSIS — I2510 Atherosclerotic heart disease of native coronary artery without angina pectoris: Secondary | ICD-10-CM | POA: Diagnosis not present

## 2022-11-16 DIAGNOSIS — I872 Venous insufficiency (chronic) (peripheral): Secondary | ICD-10-CM | POA: Diagnosis not present

## 2022-11-16 MED ORDER — FUROSEMIDE 40 MG PO TABS: 40.0000 mg | ORAL_TABLET | Freq: Every day | ORAL | 1 refills | Status: DC | PRN

## 2022-11-17 ENCOUNTER — Ambulatory Visit (HOSPITAL_BASED_OUTPATIENT_CLINIC_OR_DEPARTMENT_OTHER): Payer: Medicare Other | Admitting: General Surgery

## 2022-11-17 NOTE — Progress Notes (Signed)
JACQUIS, PAXTON ([id number]) [id number] Page 1 of 1 Visit Report for 11/16/2022 SuperBill Details Patient Name: Date of Service: [name], [name] 11/16/2022 Medical Record Number: [id number] Patient Account Number: [id number] Date of Birth/Sex: Treating RN: [birth year] (72 y.o. 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

## 2022-11-17 NOTE — Progress Notes (Signed)
[name], [name] ([id number]) [id number] Page 1 of 5 Visit Report for 11/16/2022 Arrival Information Details Patient Name: Date of Service: [name], [name] 11/16/2022 11:15 A M Medical Record Number: [id number] Patient Account Number: [id number] Date of Birth/Sex: Treating RN: 1951/03/14 (72 y.o. [name] Primary Care Sherri Levenhagen: PA [name], [place] Other Clinician: Referring Johnnae Impastato: Treating Demeshia Sherburne/Extender: [name] in Treatment: 40 Visit Information History Since Last Visit Added or deleted any medications: No Patient Arrived: Wheel Chair Any new allergies or adverse reactions: No Arrival Time: 10:59 Had a fall or experienced change in No Accompanied By: self activities of daily living that may affect Transfer Assistance: None risk of falls: Patient Identification Verified: Yes Signs or symptoms of abuse/neglect since last visito No Secondary Verification Process Completed: Yes Hospitalized since last visit: No Patient Requires Transmission-Based Precautions: No Implantable device outside of the clinic excluding No Patient Has Alerts: Yes cellular tissue based products placed in the center Patient Alerts: R ABI= N/C, TBI= .75 since last visit: L ABI =N/C, TBI = .57 Has Dressing in Place as Prescribed: Yes Has Compression in Place as Prescribed: Yes Pain Present Now: No Electronic Signature(s) Signed: 11/16/2022 4:38:05 PM By: Zenaida Deed RN, BSN Entered By: Zenaida Deed on 11/16/2022 11:05:12 -------------------------------------------------------------------------------- Compression Therapy Details Patient Name: Date of Service: [name] 11/16/2022 11:15 A M Medical Record Number: [id number] Patient Account Number: [id number] Date of Birth/Sex: Treating RN: 1951-03-14 (72 y.o. [name] Primary Care Nero Sawatzky: PA [name], [place] Other Clinician: Referring Secily Walthour: Treating Khaleem Burchill/Extender: [name] in Treatment: 40 Compression Therapy Performed for Wound Assessment: Wound #3 Right,Medial Lower Leg Performed By: Clinician Zenaida Deed, RN Compression Type: Double Layer Notes [name] Electronic Signature(s) Signed: 11/16/2022 4:38:05 PM By: Zenaida Deed RN, BSN Entered By: Zenaida Deed on 11/16/2022 11:40:25 -------------------------------------------------------------------------------- Encounter Discharge Information Details Patient Name: Date of Service: [name], [name] 11/16/2022 11:15 A M Medical Record Number: [id number] Patient Account Number: [id number] Date of Birth/Sex: Treating RN: 1951-04-06 (72 y.o. [name] Primary Care Shalisa Mcquade: PA [name], [place] Other Clinician: Referring Kamille Toomey: Treating Ricca Melgarejo/Extender: [name] in Treatment: [phone] Encounter Discharge Information Items Discharge Condition: Stable Ambulatory Status: Wheelchair Discharge Destination: Home Transportation: Private Auto Accompanied By: self [name] ([id number]) [id number] Page 2 of 5 Schedule Follow-up Appointment: Yes Clinical Summary of Care: Patient Declined Electronic Signature(s) Signed: 11/16/2022 4:38:05 PM By: Zenaida Deed RN, BSN Entered By: Zenaida Deed on 11/16/2022 11:41:45 -------------------------------------------------------------------------------- Patient/Caregiver Education Details Patient Name: Date of Service: [name] 4/23/2024andnbsp11:15 A M Medical Record Number: [id number] Patient Account Number: [id number] Date of Birth/Gender: Treating RN: March 17, 1951 (72 y.o. [name] Primary Care Physician: PA [name], [place] Other Clinician: Referring Physician: Treating Physician/Extender: [name] in Treatment: 40 Education Assessment Education Provided To: Patient Education Topics Provided Infection: Methods: Explain/Verbal Responses:  Reinforcements needed, State content correctly Venous: Methods: Explain/Verbal Responses: Reinforcements needed, State content correctly Electronic Signature(s) Signed: 11/16/2022 4:38:05 PM By: Zenaida Deed RN, BSN Entered By: Zenaida Deed on 11/16/2022 11:41:24 -------------------------------------------------------------------------------- Wound Assessment Details Patient Name: Date of Service: [name], [name] 11/16/2022 11:15 A M Medical Record Number: [id number] Patient Account Number: [id number] Date of Birth/Sex: Treating RN: 1951-03-06 (72 y.o. [name] Primary Care Somer Trotter: PA [name], [place] Other Clinician: Referring Raylee Adamec: Treating Radley Teston/Extender: [name] Weeks in Treatment: 40 Wound Status Wound Number: 3 Primary Lymphedema Etiology: Wound Location: Right, Medial Lower Leg Wound Open Wounding Event: Gradually Appeared Status:  Date Acquired: 12/07/2021 Comorbid Cataracts, Anemia, Lymphedema, Congestive Heart Failure, Deep Weeks Of Treatment: 40 History: Vein Thrombosis, Hypertension, Peripheral Venous Disease Clustered Wound: No Wound Measurements Length: (cm) 13.1 Width: (cm) 8.5 Depth: (cm) 0.6 Area: (cm) 87.454 Volume: (cm) 52.472 % Reduction in Area: -1537.4% % Reduction in Volume: -2356.6% Epithelialization: None Tunneling: No Undermining: No Wound Description Classification: Full Thickness With Exposed Support Structures Wound Margin: Distinct, outline attached Exudate Amount: Large Exudate Type: Serosanguineous Exudate Color: red, brown [name] ([id number]) Foul Odor After Cleansing: No Slough/Fibrino Yes [id number] Page 3 of 5 Wound Bed Granulation Amount: Large (67-100%) Exposed Structure Granulation Quality: Red, Friable Fascia Exposed: No Necrotic Amount: Small (1-33%) Fat Layer (Subcutaneous Tissue) Exposed: Yes Necrotic Quality: Adherent Slough Tendon Exposed:  No Muscle Exposed: No Joint Exposed: No Bone Exposed: No Periwound Skin Texture Texture Color No Abnormalities Noted: No No Abnormalities Noted: No Excoriation: Yes Erythema: Yes Erythema Change: Increased Moisture Hemosiderin Staining: Yes No Abnormalities Noted: No Rubor: No Dry / Scaly: No Maceration: No Temperature / Pain Temperature: No Abnormality Tenderness on Palpation: Yes Treatment Notes Wound #3 (Lower Leg) Wound Laterality: Right, Medial Cleanser Peri-Wound Care Triamcinolone 15 (g) Discharge Instruction: Use triamcinolone 15 (g) as directed Zinc Oxide Ointment 30g tube Discharge Instruction: Apply Zinc Oxide to excoriated periwound with each dressing change as needed Sween Lotion (Moisturizing lotion) Discharge Instruction: Apply moisturizing lotion as directed Topical Gentamicin Discharge Instruction: mix 50:50 with mupirocin to wound bed Mupirocin Ointment Discharge Instruction: Apply Mupirocin (Bactroban) as instructed Primary Dressing Secondary Dressing ABD Pad, 8x10 Discharge Instruction: Apply over primary dressing as directed. Drawtex 4x4 in Discharge Instruction: Apply over primary dressing as directed. Woven Gauze Sponge, Non-Sterile 4x4 in Discharge Instruction: Apply over primary dressing as directed. Zetuvit Plus 4x8 in Discharge Instruction: Apply over primary dressing as directed. Secured With Compression Wrap Urgo K2, two layer compression system, regular Discharge Instruction: Apply Urgo K2 as directed (alternative to 4 layer compression). Compression Stockings Add-Ons Electronic Signature(s) Signed: 11/16/2022 4:38:05 PM By: Zenaida Deed RN, BSN Entered By: Zenaida Deed on 11/16/2022 11:39:52 Wound Assessment Details -------------------------------------------------------------------------------- [name] ([id number]) [id number] Page 4 of 5 Patient Name: Date of Service: [name], [name] 11/16/2022  11:15 A M Medical Record Number: [id number] Patient Account Number: [id number] Date of Birth/Sex: Treating RN: 03-12-1951 (72 y.o. [name] Primary Care Stanford Strauch: PA [name], [place] Other Clinician: Referring Kmari Halter: Treating Kuper Rennels/Extender: [name] in Treatment: 40 Wound Status Wound Number: 6 Primary Calcinosis Etiology: Wound Location: Right, Anterior Lower Leg Wound Open Wounding Event: Gradually Appeared Status: Date Acquired: 05/12/2022 Comorbid Cataracts, Anemia, Lymphedema, Congestive Heart Failure, Deep Weeks Of Treatment: 26 History: Vein Thrombosis, Hypertension, Peripheral Venous Disease Clustered Wound: Yes Wound Measurements Length: (cm) 2.7 % Reduction in Area: -528.8% Width: (cm) 0.7 % Reduction in Volume: -3612.5% Depth: (cm) 0.6 Epithelialization: Small (1-33%) Clustered Quantity: 2 Tunneling: No Area: (cm) 1.484 Undermining: No Volume: (cm) 0.891 Wound Description Classification: Full Thickness Without Exposed Support Structures Foul Odor After Cleansing: No Wound Margin: Epibole Slough/Fibrino Yes Exudate Amount: Medium Exudate Type: Serosanguineous Exudate Color: red, brown Wound Bed Granulation Amount: Medium (34-66%) Exposed Structure Granulation Quality: Red Fascia Exposed: No Necrotic Amount: Medium (34-66%) Fat Layer (Subcutaneous Tissue) Exposed: Yes Necrotic Quality: Adherent Slough Tendon Exposed: No Muscle Exposed: No Joint Exposed: No Bone Exposed: No Periwound Skin Texture Texture Color No Abnormalities Noted: Yes No Abnormalities Noted: No Atrophie Blanche: No Moisture Cyanosis: No No Abnormalities Noted: Yes Ecchymosis: No Erythema: No Hemosiderin Staining:  Yes Mottled: No Pallor: No Rubor: No Temperature / Pain Temperature: No Abnormality Tenderness on Palpation: Yes Treatment Notes Wound #6 (Lower Leg) Wound Laterality: Right, Anterior Cleanser Peri-Wound Care Triamcinolone  15 (g) Discharge Instruction: Use triamcinolone 15 (g) as directed Zinc Oxide Ointment 30g tube Discharge Instruction: Apply Zinc Oxide to excoriated periwound with each dressing change as needed Sween Lotion (Moisturizing lotion) Discharge Instruction: Apply moisturizing lotion as directed Topical Gentamicin Discharge Instruction: mix 50:50 with mupirocin to wound bed Mupirocin Ointment Discharge Instruction: Apply Mupirocin (Bactroban) as instructed [name], [name] ([id number]) [id number] Page 5 of 5 Primary Dressing Secondary Dressing Drawtex 4x4 in Discharge Instruction: Apply over primary dressing as directed. Woven Gauze Sponge, Non-Sterile 4x4 in Discharge Instruction: Apply over primary dressing as directed. Zetuvit Plus 4x8 in Discharge Instruction: Apply over primary dressing as directed. Secured With Compression Wrap Urgo K2, two layer compression system, regular Discharge Instruction: Apply Urgo K2 as directed (alternative to 4 layer compression). Compression Stockings Add-Ons Electronic Signature(s) Signed: 11/16/2022 4:38:05 PM By: Zenaida Deed RN, BSN Entered By: Zenaida Deed on 11/16/2022 11:40:05 -------------------------------------------------------------------------------- Vitals Details Patient Name: Date of Service: [name] 11/16/2022 11:15 A M Medical Record Number: [id number] Patient Account Number: [id number] Date of Birth/Sex: Treating RN: 03-07-1951 (72 y.o. [name] Primary Care Azana Kiesler: PA [name], [place] Other Clinician: Referring Kyleigh Nannini: Treating Tracyann Duffell/Extender: [name] in Treatment: 40 Vital Signs Time Taken: 11:05 Temperature (F): 97.8 Height (in): 71 Pulse (bpm): 65 Weight (lbs): 267 Respiratory Rate (breaths/min): 20 Body Mass Index (BMI): 37.2 Blood Pressure (mmHg): 141/70 Reference Range: 80 - 120 mg / dl Electronic Signature(s) Signed: 11/16/2022 4:38:05 PM  By: Zenaida Deed RN, BSN Entered By: Zenaida Deed on 11/16/2022 11:05:41

## 2022-11-19 ENCOUNTER — Encounter (HOSPITAL_BASED_OUTPATIENT_CLINIC_OR_DEPARTMENT_OTHER): Payer: Medicare Other | Admitting: General Surgery

## 2022-11-19 DIAGNOSIS — I872 Venous insufficiency (chronic) (peripheral): Secondary | ICD-10-CM | POA: Diagnosis not present

## 2022-11-19 DIAGNOSIS — I890 Lymphedema, not elsewhere classified: Secondary | ICD-10-CM | POA: Diagnosis not present

## 2022-11-19 DIAGNOSIS — L97522 Non-pressure chronic ulcer of other part of left foot with fat layer exposed: Secondary | ICD-10-CM | POA: Diagnosis not present

## 2022-11-19 DIAGNOSIS — L942 Calcinosis cutis: Secondary | ICD-10-CM | POA: Diagnosis not present

## 2022-11-19 DIAGNOSIS — L97812 Non-pressure chronic ulcer of other part of right lower leg with fat layer exposed: Secondary | ICD-10-CM | POA: Diagnosis not present

## 2022-11-19 DIAGNOSIS — I10 Essential (primary) hypertension: Secondary | ICD-10-CM | POA: Diagnosis not present

## 2022-11-19 DIAGNOSIS — I2510 Atherosclerotic heart disease of native coronary artery without angina pectoris: Secondary | ICD-10-CM | POA: Diagnosis not present

## 2022-11-20 NOTE — Progress Notes (Signed)
[name], [name] ([id number]) [id number] Page 1 of 14 Visit Report for 11/19/2022 Chief Complaint Document Details Patient Name: Date of Service: [name] 11/19/2022 12:30 PM Medical Record Number: [id number] Patient Account Number: [id number] Date of Birth/Sex: Treating RN: 10/14/1950 (72 y.o. M) Primary Care Provider: PA [name], NO Other Clinician: Referring Provider: Treating Provider/Extender: [name] in Treatment: 40 Information Obtained from: Patient Chief Complaint Patient seen for complaints of Non-Healing Wounds. Electronic Signature(s) Signed: 11/19/2022 1:49:15 PM By: Duanne Guess MD FACS Entered By: Duanne Guess on 11/19/2022 13:49:15 -------------------------------------------------------------------------------- Debridement Details Patient Name: Date of Service: [name], [name] 11/19/2022 12:30 PM Medical Record Number: [id number] Patient Account Number: [id number] Date of Birth/Sex: Treating RN: Mar 31, 1951 (72 y.o. [name] Primary Care Provider: PA [name], [place] Other Clinician: Referring Provider: Treating Provider/Extender: [name] in Treatment: 40 Debridement Performed for Assessment: Wound #3 Right,Medial Lower Leg Performed By: Physician Duanne Guess, MD Debridement Type: Debridement Level of Consciousness (Pre-procedure): Awake and Alert Pre-procedure Verification/Time Out Yes - 13:15 Taken: Start Time: 13:18 Pain Control: Lidocaine 4% T opical Solution Percent of Wound Bed Debrided: 10% T Area Debrided (cm): otal 10.36 Tissue and other material debrided: Non-Viable, Slough, [employer], Other: calcium deposits Level: Non-Viable Tissue Debridement Description: Selective/Open Wound Instrument: Curette Bleeding: Minimum Hemostasis Achieved: Pressure Procedural Pain: 3 Post Procedural Pain: 2 Response to Treatment: Procedure was tolerated well Level of  Consciousness (Post- Awake and Alert procedure): Post Debridement Measurements of Total Wound Length: (cm) 13.2 Width: (cm) 10 Depth: (cm) 0.4 Volume: (cm) 41.469 Character of Wound/Ulcer Post Debridement: Stable Post Procedure Diagnosis Same as Pre-procedure Notes : Scribed for Dr. [name] by Zenaida Deed, RN Electronic Signature(s) Signed: 11/19/2022 4:46:59 PM By: Duanne Guess MD FACS Signed: 11/19/2022 4:53:34 PM By: Zenaida Deed RN, BSN [place], [name] ([id number]) [id number] Page 2 of 14 Entered By: Zenaida Deed on 11/19/2022 13:27:23 -------------------------------------------------------------------------------- HPI Details Patient Name: Date of Service: [name], [name] 11/19/2022 12:30 PM Medical Record Number: [id number] Patient Account Number: [id number] Date of Birth/Sex: Treating RN: [birth year] (72 y.o. M) Primary Care Provider: PA [name], NO Other Clinician: Referring Provider: Treating Provider/Extender: [name] in Treatment: 40 History of Present Illness HPI Description: ADMISSION 02/09/2022 This is a 72 year old man who recently moved to [place] from [place]. He has a history of of coronary artery disease and prior left popliteal vein DVT . He is not diabetic and does not smoke. 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 He has had multiple spinal surgeries and has limited mobility. He primarily uses a motorized wheelchair. He has had lymphedema for many years and does have lymphedema pumps although he says he does not use them frequently. He has wounds on his bilateral lower extremities. He was receiving care in [place] for these prior to his move. They were apparently packing his wounds with Betadine soaked gauze. He has worn compression wraps in the past but not recently. He did say that they helped tremendously. In clinic today, his right ABI is noncompressible but has good signals;  the left ABI was 1.17. No formal vascular studies have been performed. On his left dorsal foot, there is a circular lesion that exposes the fat layer. There is fairly heavy slough accumulation within the wound, but no significant odor. On his right medial calf, he has multiple pinholes that have slough buildup in them and probe for about 2 to 4 mm in depth. They are draining clear fluid.  On his right lateral midfoot, he has ulceration consistent with venous stasis. It is geographic and has slough accumulation. 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 02/17/2022: All of the wounds are little bit larger today and have more slough accumulation. Today, the medial calf openings are larger and probe to something that feels calcified. There is odor coming from his wounds. His vascular studies are scheduled for August 9 and 10. 02-24-2022 upon evaluation today patient presents for follow-up concerning his ongoing issues here with his lower extremities. With that being said he does have significant problems with what appears to be cellulitis unfortunately the PCR culture that was sent last week got crushed by [employer] in transit. With that being said we are going to reobtain a culture today although I am actually to do it on the right medial leg as this seems to be the most inflamed area today where there is some questionable drainage as well. I am going to remove some of the calcium deposits which are loosening obtain a deeper culture from this area. Fortunately I do not see any evidence of active infection systemically which is good news but locally I think we are having a bigger issue here. He does have his appointment next Thursday with vascular. Patient has also been referred to [facility] for further evaluation and treatment of the calcinosis for possible sulfate injections. 03/04/2022: The culture that was performed last  week grew out multiple species including Klebsiella, Morganella, and Pseudomonas aeruginosa. He had been prescribed Bactrim but this was not adequate to cover all of the species and levofloxacin was recommended. He completed the Bactrim but did not pick up the levofloxacin and begin taking it until yesterday. 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 He had arterial studies performed today. He does have evidence of peripheral arterial disease. Results copied here: +-------+-----------+-----------+------------+------------+ ABI/TBIT oday's ABIT oday's TBIPrevious ABIPrevious TBI +-------+-----------+-----------+------------+------------+ Right [place] 0.75    +-------+-----------+-----------+------------+------------+ Left [place] 0.57    +-------+-----------+-----------+------------+------------+ Arterial wall calcification precludes accurate ankle pressures and ABIs. Summary: Right: Resting right ankle-brachial index indicates noncompressible right lower extremity arteries. The right toe-brachial index is normal. Left: Resting left ankle-brachial index indicates noncompressible left lower extremity arteries. The left toe-brachial index is abnormal. He continues to have further tissue breakdown at the right medial calf and there is ample slough accumulation along with necrotic subcutaneous tissue. The left dorsal foot wound has built up slough, as well. The right medial foot wound is nearly closed with just a light layer of eschar over the surface. The right dorsal lateral foot wound has also accumulated a fair amount of slough and remains quite tender. 03/10/2022: He has been taking the prescribed levofloxacin and has about 3 days left of this. The erythema and induration on his right leg has improved. He continues to  experience further breakdown of the right medial calf wound. There is extensive slough and debris accumulation there. There is heavy slough on his left dorsal foot wound, but no odor or purulent drainage. The right medial foot wound appears to be closed. The right dorsal lateral foot wound also has a fair amount of slough but it is less tender than at our last visit. He still does not have an appointment with dermatology. 03/22/2022: The right anterior tibial wound  has closed. The right lateral foot wound is nearly closed with just a little bit of slough. The left dorsal foot wound is smaller and continues to have some slough buildup but less so than at prior visits. There is good granulation tissue underlying the slough. Unfortunately the right medial calf wound continues to deteriorate. It has a foul odor today and a lot of necrotic tissue, the surrounding skin is also erythematous and moist. 03/30/2022: The right lateral foot wound continues to contract and just has a little bit of slough and eschar present. The left dorsal foot wound is also smaller with slough overlying good granulation tissue. The right medial calf wound actually looks quite a bit better today; there is no odor and there is much less necrotic tissue although some remains present. We have been using topical gentamicin and he has been taking oral Augmentin. 04/07/2022: The patient saw dermatology at [facility] last week. Unfortunately, it appears that they did not read any of the documentation that was provided. 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 Overall, it was a highly unsatisfactory  consultation. In the meantime, however, we were able to identify compounding pharmacy who could create a topical sodium thiosulfate ointment. The patient has that with him today. Both of the right foot wounds are now closed. The left dorsal foot wound has contracted but still has a layer of slough on the surface. The medial calf wounds on the right all look a little bit better. They still have heavy slough along with the chunks of calcium. Everything is stained purple. 04/14/2022: The wound on his dorsal left foot is a little bit smaller again today. There is still slough accumulation on the surface. The medial calf wounds have quite a bit less slough accumulation today. His right leg, however, is warm and erythematous, concerning for cellulitis. 04/14/2022: He completed his course of Augmentin yesterday. The medial calf wound sites are much cleaner today and shallower. There is less fragmented calcium in the wounds. He has a lot of lymphedema fluid-like drainage from these wounds, but no purulent discharge or malodor. The wound on his dorsal left foot has also contracted and is shallower. There is a layer of slough over healthy granulation tissue. 05/05/2022: The left dorsal foot wound is smaller today with less slough and more granulation tissue. The right medial calf wounds are shallower, but have extensive slough and some fat necrosis. The drainage has diminished considerably, however. 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 05/12/2022: The left dorsal foot wound continues to contract and fill with granulation tissue. There is slough on the wound surface. The right medial calf wounds also continue to fill with healthy tissue, but there is remaining slough and fat necrosis present. He had a significant increase in his drainage this week and it was a bright yellow-green color. He  also had a new wound open over his right anterior tibial surface associated with the spike of cutaneous calcium. The leg is more red, as well, concerning for infection. 05/19/2022: His culture returned positive for Pseudomonas. He is currently taking a course of  oral levofloxacin. We have also been using topical gentamicin mixed in with his sodium thiosulfate. All of the wounds look better this week. 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 There is slough and nonviable subcutaneous tissue still present. The new anterior tibial wound just has a light layer of slough. The dorsal foot wound also has some slough present and appears a little dry today. 05/26/2022: The right medial leg wound continues to improve. It is feeling with granulation tissue, but still accumulates a fairly thick layer of slough on the surface. The more recent anterior tibial wound has remained quite small, but also has some slough on the surface. The left dorsal foot wound has contracted somewhat and has perimeter epithelialization. He completed his oral levofloxacin. 06/02/2022: The left dorsal foot wound continues to improve significantly. There is just a little slough on the wound surface. The right medial leg wound had black drainage, but it looks like silver alginate was applied last week and I theorized that silver sulfide was created with a combination of the silver alginate and the sodium thiosulfate. It did, however, have a bit of an odor to it and extensive slough accumulation. The small anterior tibial wound also had a bit of slough and nonviable subcutaneous tissue present. The skin of his leg was rather macerated, as well. 06/09/2022: The left dorsal foot wound is smaller again this week with just a light layer of slough on the surface. The right medial leg wound looks substantially better. The leg and periwound skin are no longer red and macerated. There is good granulation tissue  forming with less slough and nonviable tissue. The anterior tibial wound just has a small amount of slough accumulation. 06/16/2022: The left dorsal foot wound continues to contract. His wrap slipped a little bit, however, and his foot is more swollen. The right medial leg wound continues to improve. The underlying tissue is more vital-appearing and there is less slough. He does have substantial drainage. The small anterior tibial wound has a bit of slough accumulation. 06/23/2022: The left dorsal foot wound is about half the size as it was last week. There is just some light slough on the surface and some eschar buildup around the edges. The right anterior leg wound is small with a little slough on the surface. Unfortunately, the right medial leg wound deteriorated fairly substantially. It is malodorous with gray/green/black discoloration. 06/30/2022: The left dorsal foot wound continues to contract. It is nearly closed with just a light layer of slough present. The right anterior leg wound is about the same size and has a small amount of slough on the surface. The right medial leg wound looks a lot better than it did last week. The odor has abated and the tissue is less pulpy and necrotic-looking. There is still a fairly thick layer of slough present. 12/13; the left dorsal foot wound looks very healthy. There was no need to change the dressing here and no debridement. He has a small area on the right anterior lower leg apparently had not changed much in size. The area on the medial leg is the most problematic area this is large with some depth and a completely nonviable surface today. 07/14/2022: The left dorsal foot wound is smaller today with just a little slough and eschar present. The right anterior lower leg wound is about the same without any significant change. The medial leg wound has a black and green surface and is malodorous. No purulent drainage and  the periwound skin is intact.  Edema control is suboptimal. 07/21/2022: The dorsal foot wound continues to contract and is nearly closed with just a little bit of slough and eschar on the surface. The right anterior lower leg wound is shallower today but otherwise the dimensions are unchanged. The medial leg wound looks significantly better although it still has a nonviable surface; the odor and black/green surface has resolved. His culture came back with multiple species sensitive to Augmentin, which was prescribed and a very low level of Pseudomonas, which should be handled by the topical gentamicin we have been using. 07/28/2022: The dorsal foot wound is down to just a very tiny opening with a little eschar on the surface. The right anterior tibial wound is about the same with some slough buildup. The right medial leg wound looks quite a bit better this week. There is thick rubbery slough on the surface, but the odor and drainage has improved significantly. 08/04/2022: The dorsal foot wound is almost healed, but the periwound skin has broken down. This appears to be secondary to moisture and adhesive from the absorbent pad. The right anterior tibial wound is shallower. The right medial leg wound continues to improve. It is smaller, still with rubbery slough on the surface. No malodor or purulent drainage. 08/11/2022: The dorsal foot wound is about the same size. It is a little bit dry with some slough accumulation. The right anterior tibial wound is also unchanged. The right medial leg wound is filling in further with good granulation tissue. Still with some slough buildup on the surface. 08/18/2022: His left leg is bright red, hot, and swollen with cellulitis. The dorsal foot wound actually looks quite good and is superficial with just a little bit of slough accumulation. The right anterior tibial wound is unchanged. The right medial leg wound has filled in with good granulation tissue but has copious amounts of  drainage. 08/25/2022: His cellulitis has responded nicely to the oral antibiotics. His dorsal foot wound is smaller but has a fair amount of slough buildup. The right anterior tibial wound remains stable and unchanged. The right medial leg wound has more granulation tissue under a layer of slough, but continues to have copious drainage. The drainage is actually causing skin irritation and threatens to result in breakdown. 09/01/2022: He continues to have profuse drainage from his wounds which is resulting in tissue maceration on both the dorsum of his left foot and the periwound distal to his medial leg wound. The dorsal foot wound is nearly healed, despite this. The medial right leg wound is filling in with good granulation tissue. We are working on getting home health assistance to change his dressings more frequently to try and control the drainage better. 09/08/2022: We have had success in controlling his drainage. The tissue maceration has improved significantly and his swelling has come down quite markedly. The medial right leg wound is much more superficial and has substantially less nonviable tissue present. The anterior tibial wound is a little bit larger, however with some nonviable tissue present. ZOLTON, DOWSON ([id number]) [id number] Page 4 of 14 09/15/2022: The dorsal foot wound is nearly closed. The anterior tibial wound measures slightly larger today. The medial right leg wound continues to improve quite dramatically with less nonviable tissue and more robust-looking granulation. Edema control is significantly better. 09/22/2022: The left dorsal foot wound remains open, albeit quite tiny. The anterior tibial wound is a little bit larger again today. The medial right leg wound continues to improve with  an increase in robust and healthy granulation tissue and less accumulation of nonviable tissue. 09/29/2022: His home health providers wrapped his left leg extremely  tightly and he ended up having to cut the wrap off of his foot. This resulted in his foot being very swollen and that the dorsal foot wound is a little bit larger today. The anterior tibial wound is about the same size with a little slough on the surface. The medial right leg wound continues to fill in. There are very few areas with any significant depth. There is still slough accumulation. 3/13; Patient has a left dorsal foot wound along with a right anterior tibial wound and right medial wound. We are using silver alginate to the left dorsal foot wound, Santyl and sodium thiosulfate compound ointment to the right lower extremity wounds under 4 layer compression. Patient has no issues or complaints today. 10/13/2022: The left dorsal foot wound is a little bit bigger today, but his foot is also a little bit more swollen. The right medial leg wound has filled in more and has substantially less depth. There is still some nonviable tissue present. The small anterior lower leg wound looks about the same. 10/20/2022: The left dorsal foot wound is epithelializing. There does not appear to be much open at this site. There is some eschar around the edges. The right medial leg wound continues to fill in with granulation tissue. The small anterior lower leg wound is a little bit bigger and there is a chunk of calcium protruding. He continues to have fairly copious drainage from the right leg. 10/27/2022: His home health agency failed to show up at all this past week and as a result, the excessive drainage from his right leg wound has resulted in tissue breakdown. His leg is red and irritated. The wounds themselves look about the same. The wound on his left dorsal foot is nearly closed underneath some eschar. 11/03/2022: We continue to have difficulty with his home health agency. He continues to have significant drainage from his right leg wound which has resulted in even further tissue breakdown. He also was not  taking his Lasix as prescribed and I think much of the drainage has been his lymphedema fluid choosing the path of least resistance. The wound on his left dorsal foot is closed, but in the process of cleaning the site, dry skin was pulled off and he has a new small superficial wound just adjacent to where the dorsal foot wound was. 11/12/2022: His left dorsal foot is completely healed. The right medial leg continues to deteriorate. He is not taking his Lasix as prescribed, nor is he using his lymphedema pumps at all. As a result, he has massive amounts of drainage saturating his dressings and causing further tissue breakdown on his medial leg. Today, his dressings had a foul odor, as well. 11/19/2022: His right medial leg looks even worse today. There is more erythema and moisture related tissue breakdown. The 2 anterior wounds look a little bit better. The culture that I took last week did produce Pseudomonas and he is currently taking levofloxacin for this. Electronic Signature(s) Signed: 11/19/2022 1:51:24 PM By: Duanne Guess MD FACS Entered By: Duanne Guess on 11/19/2022 13:51:24 -------------------------------------------------------------------------------- Physical Exam Details Patient Name: Date of Service: KAHLEEL, FADELEY 11/19/2022 12:30 PM Medical Record Number: [id number] Patient Account Number: [id number] Date of Birth/Sex: Treating RN: 1951/04/13 (72 y.o. M) Primary Care Provider: PA [name], [place] Other Clinician: Referring Provider: Treating Provider/Extender: Luvenia Redden, Edwena Felty  in Treatment: 40 Constitutional . . . . no acute distress. Respiratory Normal work of breathing on room air. Notes 11/19/2022: His right medial leg looks even worse today. There is more erythema and moisture related tissue breakdown. The 2 anterior wounds look a little bit better. Electronic Signature(s) Signed: 11/19/2022 1:52:49 PM By: Duanne Guess MD FACS Entered By: Duanne Guess on 11/19/2022 13:52:49 -------------------------------------------------------------------------------- Physician Orders Details Patient Name: Date of Service: RUBEL, HECKARD [name] 11/19/2022 12:30 PM Medical Record Number: [id number] Patient Account Number: [id number] Date of Birth/Sex: Treating RN: 1950/11/04 (72 y.o. [name] Primary Care Provider: PA [name], [place] Other Clinician: Referring Provider: Treating Provider/Extender: [name] in Treatment: 29 Verbal / Phone Orders: No KADAR, CHANCE ([id number]) [id number] Page 5 of 14 Diagnosis Coding ICD-10 Coding Code Description [phone] Non-pressure chronic ulcer of other part of right lower leg with fat layer exposed L97.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) I10 Essential (primary) hypertension I25.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 - Dr. [name] Rm 1 Return A Friday 5/3 @ 12:30 pm Nurse Visit: - Tuesday 4/30 @ 11:15 am Anesthetic Wound #3 Right,Medial Lower Leg (In clinic) Topical Lidocaine 4% applied to wound bed Wound #6 Right,Anterior Lower Leg (In clinic) Topical Lidocaine 4% applied to wound bed Bathing/ Shower/ Hygiene May shower with protection but do not get wound dressing(s) wet. Protect dressing(s) with water repellant cover (for example, large plastic bag) or a cast cover and may then take shower. Edema Control - Lymphedema / SCD / Other Lymphedema Pumps. Use Lymphedema pumps on leg(s) 2-3 times a day for 45-60 minutes. If wearing any wraps or hose, do not remove them. Continue exercising as instructed. - use 1-2 times per day over  wraps Elevate legs to the level of the heart or above for 30 minutes daily and/or when sitting for 3-4 times a day throughout the day. - throughout the day Avoid standing for long periods of time. Exercise regularly Wound Treatment Wound #3 - Lower Leg Wound Laterality: Right, Medial Peri-Wound Care: Triamcinolone 15 (g) 2 x Per Week/30 Days Discharge Instructions: Use triamcinolone 15 (g) as directed Peri-Wound Care: Zinc Oxide Ointment 30g tube 2 x Per Week/30 Days Discharge Instructions: Apply Zinc Oxide to excoriated periwound with each dressing change as needed Peri-Wound Care: Sween Lotion (Moisturizing lotion) 2 x Per Week/30 Days Discharge Instructions: Apply moisturizing lotion as directed Topical: Gentamicin 2 x Per Week/30 Days Discharge Instructions: mix 50:50 with mupirocin to wound bed Prim Dressing: Cutimed Sorbact Swab 2 x Per Week/30 Days ary Discharge Instructions: Apply to wound bed as instructed Secondary Dressing: ABD Pad, 8x10 (Generic) 2 x Per Week/30 Days Discharge Instructions: Apply over primary dressing as directed. Secondary Dressing: Zetuvit Plus 4x8 in 2 x Per Week/30 Days Discharge Instructions: Apply over primary dressing as directed. Compression Wrap: ThreePress (3 layer compression wrap) 2 x Per Week/30 Days Discharge Instructions: Apply three layer compression as directed. Wound #6 - Lower Leg Wound Laterality: Right, Anterior Peri-Wound Care: Triamcinolone 15 (g) 2 x Per Week/30 Days Discharge Instructions: Use triamcinolone 15 (g) as directed Peri-Wound Care: Zinc Oxide Ointment 30g tube 2 x Per Week/30 Days Discharge Instructions: Apply Zinc Oxide to excoriated periwound with each dressing change as needed Peri-Wound Care: Donnal Moat (  Moisturizing lotion) 2 x Per Week/30 Days Discharge Instructions: Apply moisturizing lotion as directed Topical: Gentamicin 2 x Per Week/30 Days Discharge Instructions: mix 50:50 with mupirocin to wound bed ISHAAQ, PENNA ([id number]) [id number] Page 6 of 14 Prim Dressing: Cutimed Sorbact Swab 2 x Per Week/30 Days ary Discharge Instructions: Apply to wound bed as instructed Secondary Dressing: ABD Pad, 8x10 (Generic) 2 x Per Week/30 Days Discharge Instructions: Apply over primary dressing as directed. Secondary Dressing: Zetuvit Plus 4x8 in 2 x Per Week/30 Days Discharge Instructions: Apply over primary dressing as directed. Compression Wrap: ThreePress (3 layer compression wrap) 2 x Per Week/30 Days Discharge Instructions: Apply three layer compression as directed. Electronic Signature(s) Signed: 11/19/2022 4:46:59 PM By: Duanne Guess MD FACS Entered By: Duanne Guess on 11/19/2022 13:54:09 -------------------------------------------------------------------------------- Problem List Details Patient Name: Date of Service: [name], [name] 11/19/2022 12:30 PM Medical Record Number: [id number] Patient Account Number: [id number] Date of Birth/Sex: Treating RN: [birth year] (72 y.o. 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) 02/09/2022 No Yes I10 Essential (primary) hypertension 02/09/2022 No Yes I25.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 angina pectoris 02/09/2022 No Yes I89.0 Lymphedema, not elsewhere classified 02/09/2022 No Yes Z86.718 Personal history of other venous thrombosis and embolism 02/09/2022 No Yes Inactive Problems ICD-10 Code  Description Active Date Inactive Date L97.512 Non-pressure chronic ulcer of other part of right foot with fat layer exposed 02/17/2022 02/17/2022 [name] ([id number]) [id number] Page 7 of 14 Resolved Problems Electronic Signature(s) Signed: 11/19/2022 1:48:53 PM By: Duanne Guess MD FACS Entered By: Duanne Guess on 11/19/2022 13:48:52 -------------------------------------------------------------------------------- Progress Note Details Patient Name: Date of Service: URHO, RIO [name] 11/19/2022 12:30 PM Medical Record Number: [id number] Patient Account Number: [id number] Date of Birth/Sex: Treating RN: 06-Apr-1951 (72 y.o. M) Primary Care Provider: PA [name], NO Other Clinician: Referring Provider: Treating Provider/Extender: [name] in Treatment: 40 Subjective Chief Complaint Information obtained from Patient Patient seen for complaints of Non-Healing Wounds. History of Present Illness (HPI) ADMISSION 02/09/2022 This is a 72 year old man who recently moved to [place] from [place]. He has a history of of coronary artery disease and prior left popliteal vein DVT . He is not diabetic and does not smoke. 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 He has had multiple spinal surgeries and has limited mobility. He primarily uses a motorized wheelchair. He has had lymphedema for many years and does have lymphedema pumps although he says he does not use them frequently. He has wounds on his bilateral lower extremities. He was receiving care in [place] for these prior to his move. They were apparently packing his wounds with Betadine soaked gauze. He has worn compression wraps in the past but not recently. He did say that they helped tremendously. In clinic today, his right ABI is noncompressible but has good signals; the left ABI was 1.17. No formal vascular studies have been performed. On his left dorsal foot, there  is a circular lesion that exposes the fat layer. There is fairly heavy slough accumulation within the wound, but no significant  odor. On his right medial calf, he has multiple pinholes that have slough buildup in them and probe for about 2 to 4 mm in depth. They are draining clear fluid. On his right lateral midfoot, he has ulceration consistent with venous stasis. It is geographic and has slough accumulation. 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 02/17/2022: All of the wounds are little bit larger today and have more slough accumulation. Today, the medial calf openings are larger and probe to something that feels calcified. There is odor coming from his wounds. His vascular studies are scheduled for August 9 and 10. 02-24-2022 upon evaluation today patient presents for follow-up concerning his ongoing issues here with his lower extremities. With that being said he does have significant problems with what appears to be cellulitis unfortunately the PCR culture that was sent last week got crushed by [employer] in transit. With that being said we are going to reobtain a culture today although I am actually to do it on the right medial leg as this seems to be the most inflamed area today where there is some questionable drainage as well. I am going to remove some of the calcium deposits which are loosening obtain a deeper culture from this area. Fortunately I do not see any evidence of active infection systemically which is good news but locally I think we are having a bigger issue here. He does have his appointment next Thursday with vascular. Patient has also been referred to [facility] for further evaluation and treatment of the calcinosis for possible sulfate injections. 03/04/2022: The culture that was performed last week grew out multiple species including Klebsiella, Morganella, and Pseudomonas aeruginosa. He had  been prescribed Bactrim but this was not adequate to cover all of the species and levofloxacin was recommended. He completed the Bactrim but did not pick up the levofloxacin and begin taking it until yesterday. 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 He had arterial studies performed today. He does have evidence of peripheral arterial disease. Results copied here: +-------+-----------+-----------+------------+------------+ ABI/TBIT oday's ABIT oday's TBIPrevious ABIPrevious TBI +-------+-----------+-----------+------------+------------+ Right [place] 0.75    +-------+-----------+-----------+------------+------------+ Left [place] 0.57    +-------+-----------+-----------+------------+------------+ Arterial wall calcification precludes accurate ankle pressures and ABIs. Summary: Right: Resting right ankle-brachial index indicates noncompressible right lower extremity arteries. The right toe-brachial index is normal. Left: Resting left ankle-brachial index indicates noncompressible left lower extremity arteries. The left toe-brachial index is abnormal. He continues to have further tissue breakdown at the right medial calf and there is ample slough accumulation along with necrotic subcutaneous tissue. The left dorsal foot wound has built up slough, as well. The right medial foot wound is nearly closed with just a light layer of eschar over the surface. The right dorsal lateral foot wound has also accumulated a fair amount of slough and remains quite tender. HOSEY, BURMESTER ([id number]) [id number] Page 8 of 14 03/10/2022: He has been taking the prescribed levofloxacin and has about 3 days left of this. The erythema and induration on his right leg has improved. He continues to experience further  breakdown of the right medial calf wound. There is extensive slough and debris accumulation there. There is heavy slough on his left dorsal foot wound, but no odor or purulent drainage. The right medial foot wound appears to be  closed. The right dorsal lateral foot wound also has a fair amount of slough but it is less tender than at our last visit. He still does not have an appointment with dermatology. 03/22/2022: The right anterior tibial wound has closed. The right lateral foot wound is nearly closed with just a little bit of slough. The left dorsal foot wound is smaller and continues to have some slough buildup but less so than at prior visits. There is good granulation tissue underlying the slough. Unfortunately the right medial calf wound continues to deteriorate. It has a foul odor today and a lot of necrotic tissue, the surrounding skin is also erythematous and moist. 03/30/2022: The right lateral foot wound continues to contract and just has a little bit of slough and eschar present. The left dorsal foot wound is also smaller with slough overlying good granulation tissue. The right medial calf wound actually looks quite a bit better today; there is no odor and there is much less necrotic tissue although some remains present. We have been using topical gentamicin and he has been taking oral Augmentin. 04/07/2022: The patient saw dermatology at [facility] last week. Unfortunately, it appears that they did not read any of the documentation that was provided. 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 Overall, it was a highly unsatisfactory consultation. In the meantime, however, we were able to identify compounding pharmacy who  could create a topical sodium thiosulfate ointment. The patient has that with him today. Both of the right foot wounds are now closed. The left dorsal foot wound has contracted but still has a layer of slough on the surface. The medial calf wounds on the right all look a little bit better. They still have heavy slough along with the chunks of calcium. Everything is stained purple. 04/14/2022: The wound on his dorsal left foot is a little bit smaller again today. There is still slough accumulation on the surface. The medial calf wounds have quite a bit less slough accumulation today. His right leg, however, is warm and erythematous, concerning for cellulitis. 04/14/2022: He completed his course of Augmentin yesterday. The medial calf wound sites are much cleaner today and shallower. There is less fragmented calcium in the wounds. He has a lot of lymphedema fluid-like drainage from these wounds, but no purulent discharge or malodor. The wound on his dorsal left foot has also contracted and is shallower. There is a layer of slough over healthy granulation tissue. 05/05/2022: The left dorsal foot wound is smaller today with less slough and more granulation tissue. The right medial calf wounds are shallower, but have extensive slough and some fat necrosis. The drainage has diminished considerably, however. 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 05/12/2022: The left dorsal foot wound continues to contract and fill with granulation tissue. There is slough on the wound surface. The right medial calf wounds also continue to fill with healthy tissue, but there is remaining slough and fat necrosis present. He had a significant increase in his drainage this week and it was a bright yellow-green color. He also had a new wound open over his right anterior tibial surface associated  with the spike of  cutaneous calcium. The leg is more red, as well, concerning for infection. 05/19/2022: His culture returned positive for Pseudomonas. He is currently taking a course of oral levofloxacin. We have also been using topical gentamicin mixed in with his sodium thiosulfate. All of the wounds look better this week. 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 There is slough and nonviable subcutaneous tissue still present. The new anterior tibial wound just has a light layer of slough. The dorsal foot wound also has some slough present and appears a little dry today. 05/26/2022: The right medial leg wound continues to improve. It is feeling with granulation tissue, but still accumulates a fairly thick layer of slough on the surface. The more recent anterior tibial wound has remained quite small, but also has some slough on the surface. The left dorsal foot wound has contracted somewhat and has perimeter epithelialization. He completed his oral levofloxacin. 06/02/2022: The left dorsal foot wound continues to improve significantly. There is just a little slough on the wound surface. The right medial leg wound had black drainage, but it looks like silver alginate was applied last week and I theorized that silver sulfide was created with a combination of the silver alginate and the sodium thiosulfate. It did, however, have a bit of an odor to it and extensive slough accumulation. The small anterior tibial wound also had a bit of slough and nonviable subcutaneous tissue present. The skin of his leg was rather macerated, as well. 06/09/2022: The left dorsal foot wound is smaller again this week with just a light layer of slough on the surface. The right medial leg wound looks substantially better. The leg and periwound skin are no longer red and macerated. There is good granulation tissue forming with less slough and nonviable tissue. The anterior tibial wound just has a small amount  of slough accumulation. 06/16/2022: The left dorsal foot wound continues to contract. His wrap slipped a little bit, however, and his foot is more swollen. The right medial leg wound continues to improve. The underlying tissue is more vital-appearing and there is less slough. He does have substantial drainage. The small anterior tibial wound has a bit of slough accumulation. 06/23/2022: The left dorsal foot wound is about half the size as it was last week. There is just some light slough on the surface and some eschar buildup around the edges. The right anterior leg wound is small with a little slough on the surface. Unfortunately, the right medial leg wound deteriorated fairly substantially. It is malodorous with gray/green/black discoloration. 06/30/2022: The left dorsal foot wound continues to contract. It is nearly closed with just a light layer of slough present. The right anterior leg wound is about the same size and has a small amount of slough on the surface. The right medial leg wound looks a lot better than it did last week. The odor has abated and the tissue is less pulpy and necrotic-looking. There is still a fairly thick layer of slough present. 12/13; the left dorsal foot wound looks very healthy. There was no need to change the dressing here and no debridement. He has a small area on the right anterior lower leg apparently had not changed much in size. The area on the medial leg is the most problematic area this is large with some depth and a completely nonviable surface today. 07/14/2022: The left dorsal foot wound is smaller today with just a little slough and eschar present.  The right anterior lower leg wound is about the same without any significant change. The medial leg wound has a black and green surface and is malodorous. No purulent drainage and the periwound skin is intact. Edema control is suboptimal. 07/21/2022: The dorsal foot wound continues to contract and is nearly  closed with just a little bit of slough and eschar on the surface. The right anterior lower leg wound is shallower today but otherwise the dimensions are unchanged. The medial leg wound looks significantly better although it still has a nonviable surface; the odor and black/green surface has resolved. His culture came back with multiple species sensitive to Augmentin, which was prescribed and a very low level of Pseudomonas, which should be handled by the topical gentamicin we have been using. 07/28/2022: The dorsal foot wound is down to just a very tiny opening with a little eschar on the surface. The right anterior tibial wound is about the same with some slough buildup. The right medial leg wound looks quite a bit better this week. There is thick rubbery slough on the surface, but the odor and drainage has improved significantly. 08/04/2022: The dorsal foot wound is almost healed, but the periwound skin has broken down. This appears to be secondary to moisture and adhesive from the absorbent pad. The right anterior tibial wound is shallower. The right medial leg wound continues to improve. It is smaller, still with rubbery slough on the surface. No malodor or purulent drainage. 08/11/2022: The dorsal foot wound is about the same size. It is a little bit dry with some slough accumulation. The right anterior tibial wound is also unchanged. The right medial leg wound is filling in further with good granulation tissue. Still with some slough buildup on the surface. 08/18/2022: His left leg is bright red, hot, and swollen with cellulitis. The dorsal foot wound actually looks quite good and is superficial with just a little bit of GREY, RAKESTRAW ([id number]) [id number] Page 9 of 14 slough accumulation. The right anterior tibial wound is unchanged. The right medial leg wound has filled in with good granulation tissue but has copious amounts of drainage. 08/25/2022: His cellulitis has  responded nicely to the oral antibiotics. His dorsal foot wound is smaller but has a fair amount of slough buildup. The right anterior tibial wound remains stable and unchanged. The right medial leg wound has more granulation tissue under a layer of slough, but continues to have copious drainage. The drainage is actually causing skin irritation and threatens to result in breakdown. 09/01/2022: He continues to have profuse drainage from his wounds which is resulting in tissue maceration on both the dorsum of his left foot and the periwound distal to his medial leg wound. The dorsal foot wound is nearly healed, despite this. The medial right leg wound is filling in with good granulation tissue. We are working on getting home health assistance to change his dressings more frequently to try and control the drainage better. 09/08/2022: We have had success in controlling his drainage. The tissue maceration has improved significantly and his swelling has come down quite markedly. The medial right leg wound is much more superficial and has substantially less nonviable tissue present. The anterior tibial wound is a little bit larger, however with some nonviable tissue present. 09/15/2022: The dorsal foot wound is nearly closed. The anterior tibial wound measures slightly larger today. The medial right leg wound continues to improve quite dramatically with less nonviable tissue and more robust-looking granulation. Edema control is significantly better.  09/22/2022: The left dorsal foot wound remains open, albeit quite tiny. The anterior tibial wound is a little bit larger again today. The medial right leg wound continues to improve with an increase in robust and healthy granulation tissue and less accumulation of nonviable tissue. 09/29/2022: His home health providers wrapped his left leg extremely tightly and he ended up having to cut the wrap off of his foot. This resulted in his foot being very swollen and that the  dorsal foot wound is a little bit larger today. The anterior tibial wound is about the same size with a little slough on the surface. The medial right leg wound continues to fill in. There are very few areas with any significant depth. There is still slough accumulation. 3/13; Patient has a left dorsal foot wound along with a right anterior tibial wound and right medial wound. We are using silver alginate to the left dorsal foot wound, Santyl and sodium thiosulfate compound ointment to the right lower extremity wounds under 4 layer compression. Patient has no issues or complaints today. 10/13/2022: The left dorsal foot wound is a little bit bigger today, but his foot is also a little bit more swollen. The right medial leg wound has filled in more and has substantially less depth. There is still some nonviable tissue present. The small anterior lower leg wound looks about the same. 10/20/2022: The left dorsal foot wound is epithelializing. There does not appear to be much open at this site. There is some eschar around the edges. The right medial leg wound continues to fill in with granulation tissue. The small anterior lower leg wound is a little bit bigger and there is a chunk of calcium protruding. He continues to have fairly copious drainage from the right leg. 10/27/2022: His home health agency failed to show up at all this past week and as a result, the excessive drainage from his right leg wound has resulted in tissue breakdown. His leg is red and irritated. The wounds themselves look about the same. The wound on his left dorsal foot is nearly closed underneath some eschar. 11/03/2022: We continue to have difficulty with his home health agency. He continues to have significant drainage from his right leg wound which has resulted in even further tissue breakdown. He also was not taking his Lasix as prescribed and I think much of the drainage has been his lymphedema fluid choosing the path of least  resistance. The wound on his left dorsal foot is closed, but in the process of cleaning the site, dry skin was pulled off and he has a new small superficial wound just adjacent to where the dorsal foot wound was. 11/12/2022: His left dorsal foot is completely healed. The right medial leg continues to deteriorate. He is not taking his Lasix as prescribed, nor is he using his lymphedema pumps at all. As a result, he has massive amounts of drainage saturating his dressings and causing further tissue breakdown on his medial leg. Today, his dressings had a foul odor, as well. 11/19/2022: His right medial leg looks even worse today. There is more erythema and moisture related tissue breakdown. The 2 anterior wounds look a little bit better. The culture that I took last week did produce Pseudomonas and he is currently taking levofloxacin for this. Patient History Information obtained from Patient, Chart. Family History Cancer - Mother, Heart Disease - Father,Mother, Hypertension - Father,Siblings, Lung Disease - Siblings, No family history of Diabetes, Hereditary Spherocytosis, Kidney Disease, Seizures, Stroke, Thyroid Problems,  Tuberculosis. Social History Former smoker - quit 1991, Marital Status - Married, Alcohol Use - Moderate, Drug Use - No History, Caffeine Use - Daily - coffee. Medical History Eyes Patient has history of Cataracts - right eye removed Denies history of Glaucoma, Optic Neuritis Ear/Nose/Mouth/Throat Denies history of Chronic sinus problems/congestion, Middle ear problems Hematologic/Lymphatic Patient has history of Anemia - macrocytic, Lymphedema Cardiovascular Patient has history of Congestive Heart Failure, Deep Vein Thrombosis - left leg, Hypertension, Peripheral Venous Disease Endocrine Denies history of Type I Diabetes, Type II Diabetes Genitourinary Denies history of End Stage Renal Disease Integumentary (Skin) Denies history of History of Burn Oncologic Denies  history of Received Chemotherapy, Received Radiation Psychiatric Denies history of Anorexia/bulimia, Confinement Anxiety Hospitalization/Surgery History - cervical fusion. - lumbar surgery. - coronary stent placement. - lasik eye surgery. - hydrocele excision. Medical A Surgical History Notes nd Constitutional Symptoms (General Health) obesity Ear/Nose/Mouth/Throat hard of hearing Respiratory RANDIE, TALLARICO ([id number]) [id number] Page 10 of 14 frozen diaphragm right Cardiovascular hyperlipidemia Musculoskeletal myositis, lumbar DDD, spinal stenosis, cervical facet joint syndrome Objective Constitutional no acute distress. Vitals Time Taken: 12:39 PM, Height: 71 in, Weight: 267 lbs, BMI: 37.2, Temperature: 98.2 F, Pulse: 66 bpm, Respiratory Rate: 20 breaths/min, Blood Pressure: 139/79 mmHg. Respiratory Normal work of breathing on room air. General Notes: 11/19/2022: His right medial leg looks even worse today. There is more erythema and moisture related tissue breakdown. The 2 anterior wounds look a little bit better. Integumentary (Hair, Skin) Wound #3 status is Open. Original cause of wound was Gradually Appeared. The date acquired was: 12/07/2021. The wound has been in treatment 40 weeks. The wound is located on the Right,Medial Lower Leg. The wound measures 13.2cm length x 10cm width x 0.4cm depth; 103.673cm^2 area and 41.469cm^3 volume. There is Fat Layer (Subcutaneous Tissue) exposed. There is no tunneling or undermining noted. There is a large amount of sanguinous drainage noted. The wound margin is distinct with the outline attached to the wound base. There is large (67-100%) red, friable granulation within the wound bed. There is a small (1-33%) amount of necrotic tissue within the wound bed including Adherent Slough. The periwound skin appearance exhibited: Excoriation, Hemosiderin Staining, Erythema. The periwound skin appearance did not exhibit:  Dry/Scaly, Maceration, Rubor. The surrounding wound skin color is noted with erythema. Periwound temperature was noted as No Abnormality. The periwound has tenderness on palpation. Wound #6 status is Open. Original cause of wound was Gradually Appeared. The date acquired was: 05/12/2022. The wound has been in treatment 27 weeks. The wound is located on the Right,Anterior Lower Leg. The wound measures 0.9cm length x 0.4cm width x 0.5cm depth; 0.283cm^2 area and 0.141cm^3 volume. There is Fat Layer (Subcutaneous Tissue) exposed. There is no tunneling or undermining noted. There is a medium amount of serosanguineous drainage noted. The wound margin is epibole. There is medium (34-66%) red granulation within the wound bed. There is a medium (34-66%) amount of necrotic tissue within the wound bed including Adherent Slough. The periwound skin appearance had no abnormalities noted for texture. The periwound skin appearance had no abnormalities noted for moisture. The periwound skin appearance exhibited: Hemosiderin Staining. The periwound skin appearance did not exhibit: Atrophie Blanche, Cyanosis, Ecchymosis, Mottled, Pallor, Rubor, Erythema. Periwound temperature was noted as No Abnormality. The periwound has tenderness on palpation. 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 There was a Selective/Open Wound Non-Viable Tissue Debridement with a total area of 10.36 sq cm  performed by Duanne Guess, MD. With the following instrument(s): Curette to remove Non-Viable tissue/material. Material removed includes [facility] and Other: calcium deposits after achieving pain control using Lidocaine 4% Topical Solution. No specimens were taken. A time out was conducted at 13:15, prior to the start of the procedure. A Minimum amount of bleeding was controlled with Pressure. The procedure was tolerated well with a pain level of 3 throughout and a pain level of 2 following the procedure. Post Debridement Measurements: 13.2cm length x 10cm width x 0.4cm depth; 41.469cm^3 volume. Character of Wound/Ulcer Post Debridement is stable. Post procedure Diagnosis Wound #3: Same as Pre-Procedure General Notes: : Scribed for Dr. [name] by Zenaida Deed, RN. Pre-procedure diagnosis of Wound #3 is a Lymphedema located on the Right,Medial Lower Leg . There was a Three Layer Compression Therapy Procedure by Zenaida Deed, RN. Post procedure Diagnosis Wound #3: Same as Pre-Procedure CELIA, GIBBONS ([id number]) [id number] Page 11 of 14 Plan Follow-up Appointments: Return Appointment in 1 week. - Dr. [name] Rm 1 Friday 5/3 @ 12:30 pm Nurse Visit: - Tuesday 4/30 @ 11:15 am Anesthetic: Wound #3 Right,Medial Lower Leg: (In clinic) Topical Lidocaine 4% applied to wound bed Wound #6 Right,Anterior Lower Leg: (In clinic) Topical Lidocaine 4% applied to wound bed Bathing/ Shower/ Hygiene: May shower with protection but do not get wound dressing(s) wet. Protect dressing(s) with water repellant cover (for example, large plastic bag) or a cast cover and may then take shower. Edema Control - Lymphedema / SCD / Other: Lymphedema Pumps. Use Lymphedema pumps on leg(s) 2-3 times a day for 45-60 minutes. If wearing any wraps or hose, do not remove them. Continue exercising as instructed. - use 1-2 times per day over wraps Elevate legs to the level of the heart or above for 30  minutes daily and/or when sitting for 3-4 times a day throughout the day. - throughout the day Avoid standing for long periods of time. Exercise regularly WOUND #3: - Lower Leg Wound Laterality: Right, Medial Peri-Wound Care: Triamcinolone 15 (g) 2 x Per Week/30 Days Discharge Instructions: Use triamcinolone 15 (g) as directed Peri-Wound Care: Zinc Oxide Ointment 30g tube 2 x Per Week/30 Days Discharge Instructions: Apply Zinc Oxide to excoriated periwound with each dressing change as needed Peri-Wound Care: Sween Lotion (Moisturizing lotion) 2 x Per Week/30 Days Discharge Instructions: Apply moisturizing lotion as directed Topical: Gentamicin 2 x Per Week/30 Days Discharge Instructions: mix 50:50 with mupirocin to wound bed Prim Dressing: Cutimed Sorbact Swab 2 x Per Week/30 Days ary Discharge Instructions: Apply to wound bed as instructed Secondary Dressing: ABD Pad, 8x10 (Generic) 2 x Per Week/30 Days Discharge Instructions: Apply over primary dressing as directed. Secondary Dressing: Zetuvit Plus 4x8 in 2 x Per Week/30 Days Discharge Instructions: Apply over primary dressing as directed. Com pression Wrap: ThreePress (3 layer compression wrap) 2 x Per Week/30 Days Discharge Instructions: Apply three layer compression as directed. WOUND #6: - Lower Leg Wound Laterality: Right, Anterior Peri-Wound Care: Triamcinolone 15 (g) 2 x Per Week/30 Days Discharge Instructions: Use triamcinolone 15 (g) as directed Peri-Wound Care: Zinc Oxide Ointment 30g tube 2 x Per  Week/30 Days Discharge Instructions: Apply Zinc Oxide to excoriated periwound with each dressing change as needed Peri-Wound Care: Sween Lotion (Moisturizing lotion) 2 x Per Week/30 Days Discharge Instructions: Apply moisturizing lotion as directed Topical: Gentamicin 2 x Per Week/30 Days Discharge Instructions: mix 50:50 with mupirocin to wound bed Prim Dressing: Cutimed Sorbact Swab 2 x Per Week/30 Days ary Discharge  Instructions: Apply to wound bed as instructed Secondary Dressing: ABD Pad, 8x10 (Generic) 2 x Per Week/30 Days Discharge Instructions: Apply over primary dressing as directed. Secondary Dressing: Zetuvit Plus 4x8 in 2 x Per Week/30 Days Discharge Instructions: Apply over primary dressing as directed. Com pression Wrap: ThreePress (3 layer compression wrap) 2 x Per Week/30 Days Discharge Instructions: Apply three layer compression as directed. 11/19/2022: His right medial leg looks even worse today. There is more erythema and moisture related tissue breakdown. The 2 anterior wounds look a little bit better. I used a curette to debride slough from his wounds. We will continue topical gentamicin but discontinue topical mupirocin. I'm going to change his contact layer to Sorbact superabsorbent. He also complained that his wrap was too tight so we will drop back to 3 layer compression equivalent. He will complete the course of oral levofloxacin that I prescribed. He will have a nurse visit early in the week and see me a week from today. Electronic Signature(s) Signed: 11/19/2022 2:15:33 PM By: Duanne Guess MD FACS Previous Signature: 11/19/2022 1:55:30 PM Version By: Duanne Guess MD FACS Entered By: Duanne Guess on 11/19/2022 14:15:33 -------------------------------------------------------------------------------- HxROS Details Patient Name: Date of Service: [name], [name] [name] 11/19/2022 12:30 PM Medical Record Number: [id number] Patient Account Number: [id number] Date of Birth/Sex: Treating RN: 20-Dec-1950 (72 y.o. M) Primary Care Provider: PA Fidela Juneau Other Clinician: Referring Provider: Treating Provider/Extender: [name] [place], [name] ([id number]) [id number] Page 12 of 14 Weeks in Treatment: 40 Information Obtained From Patient Chart Constitutional Symptoms (General Health) Medical History: Past Medical History  Notes: obesity Eyes Medical History: Positive for: Cataracts - right eye removed Negative for: Glaucoma; Optic Neuritis Ear/Nose/Mouth/Throat Medical History: Negative for: Chronic sinus problems/congestion; Middle ear problems Past Medical History Notes: hard of hearing Hematologic/Lymphatic Medical History: Positive for: Anemia - macrocytic; Lymphedema Respiratory Medical History: Past Medical History Notes: frozen diaphragm right Cardiovascular Medical History: Positive for: Congestive Heart Failure; Deep Vein Thrombosis - left leg; Hypertension; Peripheral Venous Disease Past Medical History Notes: hyperlipidemia Endocrine Medical History: Negative for: Type I Diabetes; Type II Diabetes Genitourinary Medical History: Negative for: End Stage Renal Disease Integumentary (Skin) Medical History: Negative for: History of Burn Musculoskeletal Medical History: Past Medical History Notes: myositis, lumbar DDD, spinal stenosis, cervical facet joint syndrome Oncologic Medical History: Negative for: Received Chemotherapy; Received Radiation Psychiatric Medical History: Negative for: Anorexia/bulimia; Confinement Anxiety HBO Extended History Items Eyes: Cataracts Immunizations [name], [name] ([id number]) [id number] Page 13 of 14 Pneumococcal Vaccine: Received Pneumococcal Vaccination: Yes Received Pneumococcal Vaccination On or After 60th Birthday: Yes Implantable Devices No devices added Hospitalization / Surgery History Type of Hospitalization/Surgery cervical fusion lumbar surgery coronary stent placement lasik eye surgery hydrocele excision Family and Social History Cancer: Yes - Mother; Diabetes: No; Heart Disease: Yes - Father,Mother; Hereditary Spherocytosis: No; Hypertension: Yes - Father,Siblings; Kidney Disease: No; Lung Disease: Yes - Siblings; Seizures: No; Stroke: No; Thyroid Problems: No; Tuberculosis: No; Former smoker -  quit 1991; Marital Status - Married; Alcohol Use: Moderate; Drug Use: No History; Caffeine Use: Daily - coffee; Financial Concerns: No; Food, Clothing or Shelter Needs: No; Support System  Lacking: No; Transportation Concerns: No Electronic Signature(s) Signed: 11/19/2022 4:46:59 PM By: Duanne Guess MD FACS Entered By: Duanne Guess on 11/19/2022 13:51:43 -------------------------------------------------------------------------------- SuperBill Details Patient Name: Date of Service: [name], [name] 11/19/2022 Medical Record Number: [id number] Patient Account Number: [id number] Date of Birth/Sex: Treating RN: 08-19-1950 (72 y.o. M) Primary Care Provider: PA [name], NO Other Clinician: Referring Provider: Treating Provider/Extender: Luvenia Redden, Edwena Felty in Treatment: 40 Diagnosis Coding ICD-10 Codes Code Description [phone] Non-pressure chronic ulcer of other part of right lower leg with fat layer exposed L97.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) I10 Essential (primary) hypertension I25.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 angina pectoris I89.0 Lymphedema, not elsewhere classified Z86.718 Personal history of other venous thrombosis and embolism Facility Procedures : CPT4 Code: [id number] Description: 97597 - DEBRIDE WOUND 1ST 20 SQ CM OR < ICD-10 Diagnosis Description 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 Modifier: ed Quantity: 1 Physician Procedures : CPT4 Code Description Modifier [id number] 99214 - WC PHYS LEVEL 4 - EST PT 25 ICD-10 Diagnosis Description 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) I89.0 Lymphedema, not elsewhere classified Quantity: 1 :  [id number] 97597 - WC PHYS DEBR WO ANESTH 20 SQ CM ICD-10 Diagnosis Description 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ed Carbine, Eleanor ([id number]) [id number] Quantity: 1 Page 14 of 14 Electronic Signature(s) Signed: 11/19/2022 2:15:55 PM By: Duanne Guess MD FACS Entered By: Duanne Guess on 11/19/2022 14:15:55

## 2022-11-20 NOTE — Progress Notes (Signed)
[name] ([id number]) [id number] Page 1 of 9 Visit Report for 11/19/2022 Arrival Information Details Patient Name: Date of Service: [name], [name] 11/19/2022 12:30 PM Medical Record Number: [id number] Patient Account Number: [id number] Date of Birth/Sex: Treating RN: [birth year] (72 y.o. M) Primary Care [name]: PA [name], NO Other Clinician: Referring [name]: Treating [name]/Extender: [name] in Treatment: 40 Visit Information History Since Last Visit All ordered tests and consults were completed: No Patient Arrived: Wheel Chair Added or deleted any medications: No Arrival Time: 12:39 Any new allergies or adverse reactions: No Accompanied By: self Had a fall or experienced change in No Transfer Assistance: None activities of daily living that may affect Patient Identification Verified: Yes risk of falls: Secondary Verification Process Completed: Yes Signs or symptoms of abuse/neglect since last visito No Patient Requires Transmission-Based Precautions: No Hospitalized since last visit: No Patient Has Alerts: Yes Has Dressing in Place as Prescribed: Yes Patient Alerts: R ABI= N/C, TBI= .75 Has Compression in Place as Prescribed: Yes L ABI =N/C, TBI = .57 Pain Present Now: No Electronic Signature(s) Signed: 11/19/2022 4:53:34 PM By: Zenaida Deed RN, BSN Entered By: Zenaida Deed on 11/19/2022 13:45:58 -------------------------------------------------------------------------------- Compression Therapy Details Patient Name: Date of Service: [name], [name] 11/19/2022 12:30 PM Medical Record Number: [id number] Patient Account Number: [id number] Date of Birth/Sex: Treating RN: Jul 12, 1951 (72 y.o. [name] Primary Care [name]: PA [name], [place] Other Clinician: Referring Emilee Market: Treating [name]/Extender: [name] in Treatment: 40 Compression Therapy Performed for Wound Assessment:  Wound #3 Right,Medial Lower Leg Performed By: Clinician Zenaida Deed, RN Compression Type: Three Layer Post Procedure Diagnosis Same as Pre-procedure Electronic Signature(s) Signed: 11/19/2022 4:53:34 PM By: Zenaida Deed RN, BSN Entered By: Zenaida Deed on 11/19/2022 13:26:57 -------------------------------------------------------------------------------- Encounter Discharge Information Details Patient Name: Date of Service: [name], [name] 11/19/2022 12:30 PM Medical Record Number: [id number] Patient Account Number: [id number] Date of Birth/Sex: Treating RN: 05-01-1951 (72 y.o. [name] Primary Care Luverne Farone: PA [name], [place] Other Clinician: Referring Rubylee Zamarripa: Treating [name]/Extender: [name] in Treatment: 42 Encounter Discharge Information Items Post Procedure Vitals Discharge Condition: Stable Temperature (F): 98.2 Ambulatory Status: Wheelchair Pulse (bpm): 66 Discharge Destination: Home Respiratory Rate (breaths/min): 18 Transportation: Private Auto Blood Pressure (mmHg): 139/79 Accompanied By: self Schedule Follow-up Appointment: [name] ([id number]) [id number] Page 2 of 9 Clinical Summary of Care: Patient Declined Electronic Signature(s) Signed: 11/19/2022 4:53:34 PM By: Zenaida Deed RN, BSN Entered By: Zenaida Deed on 11/19/2022 13:58:47 -------------------------------------------------------------------------------- Lower Extremity Assessment Details Patient Name: Date of Service: [name], [name] 11/19/2022 12:30 PM Medical Record Number: [id number] Patient Account Number: [id number] Date of Birth/Sex: Treating RN: [birth year] (72 y.o. [name] Primary Care Amdrew Oboyle: PA [name], [place] Other Clinician: Referring [name]: Treating [name]/Extender: [name] in Treatment: 40 Edema Assessment Assessed: [Left: No] [Right: No] Edema: [Left: Ye] [Right:  s] Calf Left: Right: Point of Measurement: From Medial Instep 43 cm Ankle Left: Right: Point of Measurement: From Medial Instep 26 cm Vascular Assessment Pulses: Dorsalis Pedis Palpable: [Right:Yes] Electronic Signature(s) Signed: 11/19/2022 4:53:34 PM By: Zenaida Deed RN, BSN Entered By: Zenaida Deed on 11/19/2022 12:51:45 -------------------------------------------------------------------------------- Multi Wound Chart Details Patient Name: Date of Service: [name] 11/19/2022 12:30 PM Medical Record Number: [id number] Patient Account Number: [id number] Date of Birth/Sex: Treating RN: 10/26/1950 (72 y.o. M) Primary Care Leyani Gargus: PA [name], NO Other Clinician: Referring [name]: Treating [name]/Extender: [name], [name] in Treatment: 40 Vital Signs Height(in):  71 Pulse(bpm): 66 Weight(lbs): 267 Blood Pressure(mmHg): 139/79 Body Mass Index(BMI): 37.2 Temperature(F): 98.2 Respiratory Rate(breaths/min): 20 [3:Photos:] [N/A:N/A] [name] ([id number]) [3:Right, Medial Lower Leg Wound Location: Gradually Appeared Wounding Event: Lymphedema Primary Etiology: Cataracts, Anemia, Lymphedema, Comorbid History: Congestive Heart Failure, Deep Vein Congestive Heart Failure, Deep Vein Thrombosis, Hypertension,  Peripheral Thrombosis, Hypertension, Peripheral Venous Disease 12/07/2021 Date Acquired: 40 [name] of Treatment: Open Wound Status: No Wound Recurrence: No Clustered Wound: N/A Clustered Quantity: 13.2x10x0.4 Measurements L x W x D (cm) 103.673 A (cm) :  rea 41.469 Volume (cm) : -1841.10% % Reduction in A rea: -1841.40% % Reduction in Volume: Full Thickness With Exposed Support Full Thickness Without Exposed Classification: Structures Large Exudate A mount: Sanguinous Exudate Type: red Exudate Color:  Distinct, outline attached Wound Margin: Large (67-100%) Granulation A mount: Red, Friable Granulation Quality: Small (1-33%) Necrotic A mount: Fat  Layer (Subcutaneous Tissue): Yes Fat Layer (Subcutaneous Tissue): Yes N/A Exposed Structures: Fascia: No  Tendon: No Muscle: No Joint: No Bone: No None Epithelialization: Debridement - Selective/Open Wound N/A Debridement: Pre-procedure Verification/Time Out 13:15 Taken: Lidocaine 4% Topical Solution Pain Control: Other, Slough Tissue Debrided: Non-Viable  Tissue Level: 10.36 Debridement A (sq cm): rea Curette Instrument: Minimum Bleeding: Pressure Hemostasis A chieved: 3 Procedural Pain: 2 Post Procedural Pain: Procedure was tolerated well Debridement Treatment Response: 13.2x10x0.4 Post Debridement  Measurements L x W x D (cm) 41.469 Post Debridement Volume: (cm) Excoriation: Yes Periwound Skin Texture: Maceration: No Periwound Skin Moisture: Dry/Scaly: No Erythema: Yes Periwound Skin Color: Hemosiderin Staining: Yes Rubor: No Increased Erythema  Change: No Abnormality Temperature: Yes Tenderness on Palpation: Compression Therapy Procedures Performed: Debridement] [6:Right, Anterior Lower Leg Gradually Appeared Calcinosis Cataracts, Anemia, Lymphedema, Venous Disease 05/12/2022 27 Open No Yes 2  0.9x0.4x0.5 0.283 0.141 -19.90% -487.50% Support Structures Medium Serosanguineous red, brown Epibole Medium (34-66%) Red Medium (34-66%) Fascia: No Tendon: No Muscle: No Joint: No Bone: No Medium (34-66%) N/A N/A N/A N/A N/A N/A N/A N/A N/A N/A N/A N/A  N/A Excoriation: No Induration: No Callus: No Crepitus: No Rash: No Scarring: No Maceration: No Dry/Scaly: No Hemosiderin Staining: Yes Atrophie Blanche: No Cyanosis: No Ecchymosis: No Erythema: No Mottled: No Pallor: No Rubor: No N/A No Abnormality Yes  N/A] [N/A:[id number] Page 3 of 9 N/A N/A N/A N/A N/A N/A N/A N/A N/A N/A N/A N/A N/A N/A N/A N/A N/A N/A N/A N/A N/A N/A N/A N/A N/A N/A N/A N/A N/A N/A N/A N/A N/A N/A N/A N/A N/A N/A N/A N/A N/A N/A N/A N/A N/A] Treatment Notes Electronic Signature(s) Signed: 11/19/2022 1:49:01 PM By:  Duanne Guess MD FACS Entered By: Duanne Guess on 11/19/2022 13:49:00 Multi-Disciplinary Care Plan Details -------------------------------------------------------------------------------- [name] ([id number]) [id number] Page 4 of 9 Patient Name: Date of Service: [name], [name] 11/19/2022 12:30 PM Medical Record Number: [id number] Patient Account Number: [id number] Date of Birth/Sex: Treating RN: [birth year] (72 y.o. [name] Primary Care Destine Zirkle: PA [name], [place] Other Clinician: Referring Mikhai Bienvenue: Treating Ilyana Manuele/Extender: [name] in Treatment: 40 Multidisciplinary Care Plan reviewed with physician Active Inactive Venous Leg Ulcer Nursing Diagnoses: Knowledge deficit related to disease process and management Potential for venous Insuffiency (use before diagnosis confirmed) Goals: Patient will maintain optimal edema control Date Initiated: 02/17/2022 Target Resolution Date: 11/24/2022 Goal Status: Active Interventions: Assess peripheral edema status every visit. Compression as ordered Provide education on venous insufficiency Treatment Activities: Non-invasive vascular studies : 02/17/2022 Therapeutic compression applied : 02/17/2022 Notes: Wound/Skin Impairment Nursing Diagnoses: Impaired tissue integrity Knowledge  deficit related to ulceration/compromised skin integrity Goals: Patient/caregiver will verbalize understanding of skin care regimen Date Initiated: 02/17/2022 Target Resolution Date: 11/24/2022 Goal Status: Active Ulcer/skin breakdown will have a volume reduction of 30% by week 4 Date Initiated: 02/17/2022 Date Inactivated: 03/10/2022 Target Resolution Date: 03/10/2022 Unmet Reason: infection being treated, Goal Status: Unmet waiting on derm appte Ulcer/skin breakdown will have a volume reduction of 50% by week 8 Date Initiated: 03/10/2022 Date Inactivated: 04/14/2022 Target Resolution Date:  04/07/2022 Goal Status: Unmet Unmet Reason: calcinosis Ulcer/skin breakdown will have a volume reduction of 80% by week 12 Date Initiated: 04/14/2022 Date Inactivated: 05/05/2022 Target Resolution Date: 05/05/2022 Unmet Reason: infection, chronic Goal Status: Unmet condition Interventions: Assess patient/caregiver ability to obtain necessary supplies Assess patient/caregiver ability to perform ulcer/skin care regimen upon admission and as needed Assess ulceration(s) every visit Provide education on ulcer and skin care Treatment Activities: Skin care regimen initiated : 02/17/2022 Topical wound management initiated : 02/17/2022 Notes: Electronic Signature(s) Signed: 11/19/2022 4:53:34 PM By: Zenaida Deed RN, BSN Entered By: Zenaida Deed on 11/19/2022 13:00:49 [name] ([id number]) [id number] Page 5 of 9 -------------------------------------------------------------------------------- Pain Assessment Details Patient Name: Date of Service: [name], [name] 11/19/2022 12:30 PM Medical Record Number: [id number] Patient Account Number: [id number] Date of Birth/Sex: Treating RN: May 26, 1951 (72 y.o. M) Primary Care Meagon Duskin: PA [name], NO Other Clinician: Referring Amor Hyle: Treating Trystin Hargrove/Extender: [name] in Treatment: 40 Active Problems Location of Pain Severity and Description of Pain Patient Has Paino Yes Site Locations Pain Location: Pain in Ulcers With Dressing Change: Yes Duration of the Pain. Constant / Intermittento Constant Rate the pain. Current Pain Level: 5 Worst Pain Level: 8 Least Pain Level: 0 Tolerable Pain Level: 3 Character of Pain Describe the Pain: Burning, Throbbing Pain Management and Medication Current Pain Management: Medication: Yes Is the Current Pain Management Adequate: Adequate Rest: Yes How does your wound impact your activities of daily livingo Sleep: Yes Bathing: No Appetite:  No Relationship With Others: No Bladder Continence: No Emotions: Yes Bowel Continence: No Drive: No Toileting: No Hobbies: No Dressing: No Electronic Signature(s) Signed: 11/19/2022 4:53:34 PM By: Zenaida Deed RN, BSN Entered By: Zenaida Deed on 11/19/2022 12:50:39 -------------------------------------------------------------------------------- Patient/Caregiver Education Details Patient Name: Date of Service: [name] 4/26/2024andnbsp12:30 PM Medical Record Number: [id number] Patient Account Number: [id number] Date of Birth/Gender: Treating RN: 14-May-1951 (72 y.o. [name] Primary Care Physician: PA [name], [place] Other Clinician: Referring Physician: Treating Physician/Extender: [name] in Treatment: 49 Education Assessment Education Provided To: Patient Education Topics Provided Infection: Methods: Explain/Verbal MAMORU, TAKESHITA ([id number]) [id number] Page 6 of 9 Responses: Reinforcements needed, State content correctly Venous: Methods: Explain/Verbal Responses: Reinforcements needed, State content correctly Electronic Signature(s) Signed: 11/19/2022 4:53:34 PM By: Zenaida Deed RN, BSN Entered By: Zenaida Deed on 11/19/2022 13:01:19 -------------------------------------------------------------------------------- Wound Assessment Details Patient Name: Date of Service: [name], [name] 11/19/2022 12:30 PM Medical Record Number: [id number] Patient Account Number: [id number] Date of Birth/Sex: Treating RN: [birth year] (72 y.o. [name] Primary Care Suriah Peragine: PA [name], [place] Other Clinician: Referring Tennis Mckinnon: Treating Marleni Gallardo/Extender: [name] in Treatment: 40 Wound Status Wound Number: 3 Primary Lymphedema Etiology: Wound Location: Right, Medial Lower Leg Wound Open Wounding Event: Gradually Appeared Status: Date Acquired: 12/07/2021 Comorbid Cataracts, Anemia,  Lymphedema, Congestive Heart Failure, Deep [name] Of Treatment: 40 History: Vein Thrombosis, Hypertension, Peripheral Venous Disease Clustered Wound: No Photos Wound Measurements Length: (cm) 13.2 Width: (cm) 10 Depth: (cm) 0.4 Area: (cm) 103.673 Volume: (cm)  41.469 % Reduction in Area: -1841.1% % Reduction in Volume: -1841.4% Epithelialization: None Tunneling: No Undermining: No Wound Description Classification: Full Thickness With Exposed Suppo Wound Margin: Distinct, outline attached Exudate Amount: Large Exudate Type: Sanguinous Exudate Color: red rt Structures Foul Odor After Cleansing: No Slough/Fibrino Yes Wound Bed Granulation Amount: Large (67-100%) Exposed Structure Granulation Quality: Red, Friable Fascia Exposed: No Necrotic Amount: Small (1-33%) Fat Layer (Subcutaneous Tissue) Exposed: Yes Necrotic Quality: Adherent Slough Tendon Exposed: No Muscle Exposed: No Joint Exposed: No Bone Exposed: No Periwound Skin Texture Texture Color No Abnormalities Noted: No No Abnormalities Noted: No Excoriation: Yes Erythema: Yes Erythema Change: Increased [name], [name] ([id number]) [id number] Page 7 of 9 Erythema Change: Increased Moisture Hemosiderin Staining: Yes No Abnormalities Noted: No Rubor: No Dry / Scaly: No Maceration: No Temperature / Pain Temperature: No Abnormality Tenderness on Palpation: Yes Treatment Notes Wound #3 (Lower Leg) Wound Laterality: Right, Medial Cleanser Peri-Wound Care Triamcinolone 15 (g) Discharge Instruction: Use triamcinolone 15 (g) as directed Zinc Oxide Ointment 30g tube Discharge Instruction: Apply Zinc Oxide to excoriated periwound with each dressing change as needed Sween Lotion (Moisturizing lotion) Discharge Instruction: Apply moisturizing lotion as directed Topical Gentamicin Discharge Instruction: mix 50:50 with mupirocin to wound bed Primary Dressing Cutimed Sorbact Swab Discharge  Instruction: Apply to wound bed as instructed Secondary Dressing ABD Pad, 8x10 Discharge Instruction: Apply over primary dressing as directed. Zetuvit Plus 4x8 in Discharge Instruction: Apply over primary dressing as directed. Secured With Compression Wrap ThreePress (3 layer compression wrap) Discharge Instruction: Apply three layer compression as directed. Compression Stockings Add-Ons Electronic Signature(s) Signed: 11/19/2022 4:53:34 PM By: Zenaida Deed RN, BSN Entered By: Zenaida Deed on 11/19/2022 12:57:33 -------------------------------------------------------------------------------- Wound Assessment Details Patient Name: Date of Service: [name], [name] 11/19/2022 12:30 PM Medical Record Number: [id number] Patient Account Number: [id number] Date of Birth/Sex: Treating RN: [birth year] (72 y.o. [name] Primary Care Tava Peery: PA [name], [place] Other Clinician: Referring Jessikah Dicker: Treating Cung Masterson/Extender: [name] in Treatment: 40 Wound Status Wound Number: 6 Primary Calcinosis Etiology: Wound Location: Right, Anterior Lower Leg Wound Open Wounding Event: Gradually Appeared Status: Date Acquired: 05/12/2022 Comorbid Cataracts, Anemia, Lymphedema, Congestive Heart Failure, Deep [name] Of Treatment: 27 History: Vein Thrombosis, Hypertension, Peripheral Venous Disease Clustered Wound: Yes Photos [name], [name] ([id number]) [id number] Page 8 of 9 Wound Measurements Length: (cm) Width: (cm) Depth: (cm) Clustered Quantity: Area: (cm) Volume: (cm) 0.9 % Reduction in Area: -19.9% 0.4 % Reduction in Volume: -487.5% 0.5 Epithelialization: Medium (34-66%) 2 Tunneling: No 0.283 Undermining: No 0.141 Wound Description Classification: Full Thickness Without Exposed Sup Wound Margin: Epibole Exudate Amount: Medium Exudate Type: Serosanguineous Exudate Color: red, brown port Structures Foul Odor After  Cleansing: No Slough/Fibrino Yes Wound Bed Granulation Amount: Medium (34-66%) Exposed Structure Granulation Quality: Red Fascia Exposed: No Necrotic Amount: Medium (34-66%) Fat Layer (Subcutaneous Tissue) Exposed: Yes Necrotic Quality: Adherent Slough Tendon Exposed: No Muscle Exposed: No Joint Exposed: No Bone Exposed: No Periwound Skin Texture Texture Color No Abnormalities Noted: Yes No Abnormalities Noted: No Atrophie Blanche: No Moisture Cyanosis: No No Abnormalities Noted: Yes Ecchymosis: No Erythema: No Hemosiderin Staining: Yes Mottled: No Pallor: No Rubor: No Temperature / Pain Temperature: No Abnormality Tenderness on Palpation: Yes Treatment Notes Wound #6 (Lower Leg) Wound Laterality: Right, Anterior Cleanser Peri-Wound Care Triamcinolone 15 (g) Discharge Instruction: Use triamcinolone 15 (g) as directed Zinc Oxide Ointment 30g tube Discharge Instruction: Apply Zinc Oxide to excoriated periwound with each dressing change as needed Sween Lotion (Moisturizing lotion) Discharge Instruction:  Apply moisturizing lotion as directed Topical Gentamicin Discharge Instruction: mix 50:50 with mupirocin to wound bed Primary Dressing Cutimed Sorbact Swab Discharge Instruction: Apply to wound bed as instructed [name], [name] ([id number]) [phone].pdf Page 9 of 9 Secondary Dressing ABD Pad, 8x10 Discharge Instruction: Apply over primary dressing as directed. Zetuvit Plus 4x8 in Discharge Instruction: Apply over primary dressing as directed. Secured With Compression Wrap ThreePress (3 layer compression wrap) Discharge Instruction: Apply three layer compression as directed. Compression Stockings Add-Ons Electronic Signature(s) Signed: 11/19/2022 4:53:34 PM By: Zenaida Deed RN, BSN Entered By: Zenaida Deed on 11/19/2022 12:58:03 -------------------------------------------------------------------------------- Vitals Details Patient  Name: Date of Service: [name] 11/19/2022 12:30 PM Medical Record Number: [id number] Patient Account Number: [id number] Date of Birth/Sex: Treating RN: 1951-04-05 (72 y.o. M) Primary Care Margurette Brener: PA [name], NO Other Clinician: Referring Emersyn Wyss: Treating Myisha Pickerel/Extender: [name] in Treatment: 40 Vital Signs Time Taken: 12:39 Temperature (F): 98.2 Height (in): 71 Pulse (bpm): 66 Weight (lbs): 267 Respiratory Rate (breaths/min): 20 Body Mass Index (BMI): 37.2 Blood Pressure (mmHg): 139/79 Reference Range: 80 - 120 mg / dl Electronic Signature(s) Signed: 11/19/2022 3:42:38 PM By: Dayton Scrape Entered By: Dayton Scrape on 11/19/2022 12:40:09

## 2022-11-23 ENCOUNTER — Encounter (HOSPITAL_BASED_OUTPATIENT_CLINIC_OR_DEPARTMENT_OTHER): Payer: Medicare Other | Admitting: General Surgery

## 2022-11-23 DIAGNOSIS — I2510 Atherosclerotic heart disease of native coronary artery without angina pectoris: Secondary | ICD-10-CM | POA: Diagnosis not present

## 2022-11-23 DIAGNOSIS — L97522 Non-pressure chronic ulcer of other part of left foot with fat layer exposed: Secondary | ICD-10-CM | POA: Diagnosis not present

## 2022-11-23 DIAGNOSIS — L942 Calcinosis cutis: Secondary | ICD-10-CM | POA: Diagnosis not present

## 2022-11-23 DIAGNOSIS — L97812 Non-pressure chronic ulcer of other part of right lower leg with fat layer exposed: Secondary | ICD-10-CM | POA: Diagnosis not present

## 2022-11-23 DIAGNOSIS — I872 Venous insufficiency (chronic) (peripheral): Secondary | ICD-10-CM | POA: Diagnosis not present

## 2022-11-23 DIAGNOSIS — I10 Essential (primary) hypertension: Secondary | ICD-10-CM | POA: Diagnosis not present

## 2022-11-24 ENCOUNTER — Ambulatory Visit (HOSPITAL_BASED_OUTPATIENT_CLINIC_OR_DEPARTMENT_OTHER): Payer: Medicare Other | Admitting: General Surgery

## 2022-11-24 NOTE — Progress Notes (Signed)
[name], [name] ([id number]) [id number] Page 1 of 1 Visit Report for 11/23/2022 SuperBill Details Patient Name: Date of Service: [name], [name] 11/23/2022 Medical Record Number: [id number] Patient Account Number: [id number] Date of Birth/Sex: Treating RN: Apr 09, 1951 (72 y.o. 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

## 2022-11-24 NOTE — Progress Notes (Signed)
[name], [name] ([id number]) [id number] Page 1 of 5 Visit Report for 11/23/2022 Arrival Information Details Patient Name: Date of Service: [name], [name] 11/23/2022 11:15 A M Medical Record Number: [id number] Patient Account Number: [id number] Date of Birth/Sex: Treating RN: 10-22-1950 (72 y.o. M) Primary Care Madelene Kaatz: PA [name], NO Other Clinician: Referring Jakeia Carreras: Treating Delyla Sandeen/Extender: [name] in Treatment: 41 Visit Information History Since Last Visit All ordered tests and consults were completed: No Patient Arrived: Wheel Chair Added or deleted any medications: No Arrival Time: 11:18 Any new allergies or adverse reactions: No Accompanied By: self Had a fall or experienced change in No Transfer Assistance: None activities of daily living that may affect Patient Identification Verified: Yes risk of falls: Secondary Verification Process Completed: Yes Signs or symptoms of abuse/neglect since last visito No Patient Requires Transmission-Based Precautions: No Hospitalized since last visit: No Patient Has Alerts: Yes Implantable device outside of the clinic excluding No Patient Alerts: R ABI= N/C, TBI= .75 cellular tissue based products placed in the center L ABI =N/C, TBI = .57 since last visit: Pain Present Now: No Electronic Signature(s) Signed: 11/23/2022 11:32:44 AM By: Dayton Scrape Entered By: Dayton Scrape on 11/23/2022 11:19:06 -------------------------------------------------------------------------------- Compression Therapy Details Patient Name: Date of Service: [name], [name] 11/23/2022 11:15 A M Medical Record Number: [id number] Patient Account Number: [id number] Date of Birth/Sex: Treating RN: 1951/06/30 (72 y.o. [name] Primary Care Persis Graffius: PA [name], [place] Other Clinician: Referring Sirius Woodford: Treating Jamale Spangler/Extender: [name] in Treatment: 41 Compression Therapy  Performed for Wound Assessment: Wound #3 Right,Medial Lower Leg Performed By: Clinician Zenaida Deed, RN Compression Type: Four Layer Electronic Signature(s) Signed: 11/23/2022 4:43:48 PM By: Zenaida Deed RN, BSN Entered By: Zenaida Deed on 11/23/2022 11:58:54 -------------------------------------------------------------------------------- Encounter Discharge Information Details Patient Name: Date of Service: [name], [name] 11/23/2022 11:15 A M Medical Record Number: [id number] Patient Account Number: [id number] Date of Birth/Sex: Treating RN: 12/11/1950 (72 y.o. [name] Primary Care Peaches Vanoverbeke: PA [name], [place] Other Clinician: Referring Nikesh Teschner: Treating Zyia Kaneko/Extender: [name] in Treatment: 60 Encounter Discharge Information Items Discharge Condition: Stable Ambulatory Status: Wheelchair Discharge Destination: Home Transportation: Private Auto Accompanied By: self Schedule Follow-up Appointment: Yes Clinical Summary of Care: Patient [name], [name] ([id number]) [id number] Page 2 of 5 Electronic Signature(s) Signed: 11/23/2022 4:43:48 PM By: Zenaida Deed RN, BSN Entered By: Zenaida Deed on 11/23/2022 12:00:13 -------------------------------------------------------------------------------- Patient/Caregiver Education Details Patient Name: Date of Service: [name], [name] 4/30/2024andnbsp11:15 A M Medical Record Number: [id number] Patient Account Number: [id number] Date of Birth/Gender: Treating RN: 1950-10-10 (72 y.o. [name] Primary Care Physician: PA [name], [place] Other Clinician: Referring Physician: Treating Physician/Extender: [name] in Treatment: 59 Education Assessment Education Provided To: Patient Education Topics Provided Infection: Methods: Explain/Verbal Responses: Reinforcements needed, State content correctly Venous: Methods:  Explain/Verbal Responses: Reinforcements needed, State content correctly Electronic Signature(s) Signed: 11/23/2022 4:43:48 PM By: Zenaida Deed RN, BSN Entered By: Zenaida Deed on 11/23/2022 11:59:55 -------------------------------------------------------------------------------- Wound Assessment Details Patient Name: Date of Service: [name], [name] 11/23/2022 11:15 A M Medical Record Number: [id number] Patient Account Number: [id number] Date of Birth/Sex: Treating RN: 1951/06/08 (72 y.o. [name] Primary Care Keylor Rands: PA [name], [place] Other Clinician: Referring Emree Locicero: Treating Lemuel Boodram/Extender: [name] in Treatment: 41 Wound Status Wound Number: 3 Primary Lymphedema Etiology: Wound Location: Right, Medial Lower Leg Wound Open Wounding Event: Gradually Appeared Status: Date Acquired: 12/07/2021 Comorbid Cataracts, Anemia, Lymphedema, Congestive Heart Failure, Deep Weeks  Of Treatment: 41 History: Vein Thrombosis, Hypertension, Peripheral Venous Disease Clustered Wound: No Wound Measurements Length: (cm) 13.2 Width: (cm) 10 Depth: (cm) 0.4 Area: (cm) 103.673 Volume: (cm) 41.469 % Reduction in Area: -1841.1% % Reduction in Volume: -1841.4% Epithelialization: Small (1-33%) Tunneling: No Undermining: No Wound Description Classification: Full Thickness With Exposed Suppor Wound Margin: Distinct, outline attached Exudate Amount: Large Exudate Type: Serosanguineous Exudate Color: red, brown t Structures Foul Odor After Cleansing: No Slough/Fibrino Yes Wound Bed Granulation Amount: Large (67-100%) Exposed RAIDYN, WASSINK ([id number]) [id number] Page 3 of 5 Granulation Quality: Red Fascia Exposed: No Necrotic Amount: Small (1-33%) Fat Layer (Subcutaneous Tissue) Exposed: Yes Necrotic Quality: Adherent Slough Tendon Exposed: No Muscle Exposed: No Joint Exposed: No Bone Exposed: No Periwound Skin  Texture Texture Color No Abnormalities Noted: Yes No Abnormalities Noted: No Erythema: Yes Moisture Erythema Change: Decreased No Abnormalities Noted: No Hemosiderin Staining: Yes Dry / Scaly: No Rubor: No Maceration: No Temperature / Pain Temperature: No Abnormality Tenderness on Palpation: Yes Treatment Notes Wound #3 (Lower Leg) Wound Laterality: Right, Medial Cleanser Peri-Wound Care Zinc Oxide Ointment 30g tube Discharge Instruction: Apply Zinc Oxide to excoriated periwound with each dressing change as needed Sween Lotion (Moisturizing lotion) Discharge Instruction: Apply moisturizing lotion as directed Topical Gentamicin Discharge Instruction: mix 50:50 with mupirocin to wound bed Primary Dressing Cutimed Sorbact Swab Discharge Instruction: Apply to wound bed as instructed Secondary Dressing ABD Pad, 8x10 Discharge Instruction: Apply over primary dressing as directed. Secured With Compression Wrap ThreePress (3 layer compression wrap) Discharge Instruction: Apply three layer compression as directed. Compression Stockings Add-Ons Electronic Signature(s) Signed: 11/23/2022 4:43:48 PM By: Zenaida Deed RN, BSN Entered By: Zenaida Deed on 11/23/2022 11:58:15 -------------------------------------------------------------------------------- Wound Assessment Details Patient Name: Date of Service: ACE, [name] 11/23/2022 11:15 A M Medical Record Number: [id number] Patient Account Number: [id number] Date of Birth/Sex: Treating RN: [birth year] (72 y.o. [name] Primary Care Nikyah Lackman: PA [name], [place] Other Clinician: Referring Dayven Linsley: Treating Adonias Demore/Extender: [name] in Treatment: 41 Wound Status Wound Number: 6 Primary Calcinosis Etiology: Wound Location: Right, Anterior Lower Leg Wound Open Wounding Event: Gradually Appeared Status: Date Acquired: 05/12/2022 Comorbid Cataracts, Anemia, Lymphedema, Congestive Heart  Failure, Deep Weeks Of Treatment: 27 History: Vein Thrombosis, Hypertension, Peripheral Venous Disease Sarria, Devine ([id number]) [id number] Page 4 of 5 History: Vein Thrombosis, Hypertension, Peripheral Venous Disease Clustered Wound: Yes Wound Measurements Length: (cm) Width: (cm) Depth: (cm) Clustered Quantity: Area: (cm) Volume: (cm) 0.9 % Reduction in Area: -19.9% 0.4 % Reduction in Volume: -487.5% 0.5 Epithelialization: Medium (34-66%) 2 Tunneling: No 0.283 Undermining: No 0.141 Wound Description Classification: Full Thickness Without Exposed Sup Wound Margin: Epibole Exudate Amount: Medium Exudate Type: Purulent Exudate Color: yellow, brown, green port Structures Foul Odor After Cleansing: No Slough/Fibrino Yes Wound Bed Granulation Amount: Medium (34-66%) Exposed Structure Granulation Quality: Red Fascia Exposed: No Necrotic Amount: Medium (34-66%) Fat Layer (Subcutaneous Tissue) Exposed: Yes Necrotic Quality: Adherent Slough Tendon Exposed: No Muscle Exposed: No Joint Exposed: No Bone Exposed: No Periwound Skin Texture Texture Color No Abnormalities Noted: Yes No Abnormalities Noted: No Atrophie Blanche: No Moisture Cyanosis: No No Abnormalities Noted: Yes Ecchymosis: No Erythema: No Hemosiderin Staining: Yes Mottled: No Pallor: No Rubor: No Temperature / Pain Temperature: No Abnormality Tenderness on Palpation: Yes Treatment Notes Wound #6 (Lower Leg) Wound Laterality: Right, Anterior Cleanser Peri-Wound Care Triamcinolone 15 (g) Discharge Instruction: Use triamcinolone 15 (g) as directed Zinc Oxide Ointment 30g tube Discharge Instruction: Apply Zinc Oxide to excoriated periwound with each  dressing change as needed Sween Lotion (Moisturizing lotion) Discharge Instruction: Apply moisturizing lotion as directed Topical Gentamicin Discharge Instruction: mix 50:50 with mupirocin to wound bed Primary Dressing Cutimed  Sorbact Swab Discharge Instruction: Apply to wound bed as instructed Secondary Dressing ABD Pad, 8x10 Discharge Instruction: Apply over primary dressing as directed. Secured With Compression Wrap ThreePress (3 layer compression wrap) Discharge Instruction: Apply three layer compression as directed. JOFFRE, LUCKS ([id number]) [id number] Page 5 of 5 Compression Stockings Add-Ons Electronic Signature(s) Signed: 11/23/2022 4:43:48 PM By: Zenaida Deed RN, BSN Entered By: Zenaida Deed on 11/23/2022 11:58:39 -------------------------------------------------------------------------------- Vitals Details Patient Name: Date of Service: [name] 11/23/2022 11:15 A M Medical Record Number: [id number] Patient Account Number: [id number] Date of Birth/Sex: Treating RN: 1950-08-24 (72 y.o. M) Primary Care Simrin Vegh: PA [name], NO Other Clinician: Referring Agnieszka Newhouse: Treating Star Resler/Extender: [name] in Treatment: 41 Vital Signs Time Taken: 11:19 Temperature (F): 98.0 Height (in): 71 Pulse (bpm): 65 Weight (lbs): 267 Respiratory Rate (breaths/min): 20 Body Mass Index (BMI): 37.2 Blood Pressure (mmHg): 121/65 Reference Range: 80 - 120 mg / dl Electronic Signature(s) Signed: 11/23/2022 11:32:44 AM By: Dayton Scrape Entered By: Dayton Scrape on 11/23/2022 11:19:24

## 2022-11-26 ENCOUNTER — Encounter (HOSPITAL_BASED_OUTPATIENT_CLINIC_OR_DEPARTMENT_OTHER): Payer: Medicare Other | Attending: General Surgery | Admitting: General Surgery

## 2022-11-26 DIAGNOSIS — L97812 Non-pressure chronic ulcer of other part of right lower leg with fat layer exposed: Secondary | ICD-10-CM | POA: Diagnosis not present

## 2022-11-26 DIAGNOSIS — I2510 Atherosclerotic heart disease of native coronary artery without angina pectoris: Secondary | ICD-10-CM | POA: Insufficient documentation

## 2022-11-26 DIAGNOSIS — I872 Venous insufficiency (chronic) (peripheral): Secondary | ICD-10-CM | POA: Insufficient documentation

## 2022-11-26 DIAGNOSIS — M609 Myositis, unspecified: Secondary | ICD-10-CM | POA: Insufficient documentation

## 2022-11-26 DIAGNOSIS — I110 Hypertensive heart disease with heart failure: Secondary | ICD-10-CM | POA: Insufficient documentation

## 2022-11-26 DIAGNOSIS — Z86718 Personal history of other venous thrombosis and embolism: Secondary | ICD-10-CM | POA: Insufficient documentation

## 2022-11-26 DIAGNOSIS — I509 Heart failure, unspecified: Secondary | ICD-10-CM | POA: Diagnosis not present

## 2022-11-26 DIAGNOSIS — L97522 Non-pressure chronic ulcer of other part of left foot with fat layer exposed: Secondary | ICD-10-CM | POA: Insufficient documentation

## 2022-11-26 DIAGNOSIS — L942 Calcinosis cutis: Secondary | ICD-10-CM | POA: Insufficient documentation

## 2022-11-26 DIAGNOSIS — Z87891 Personal history of nicotine dependence: Secondary | ICD-10-CM | POA: Insufficient documentation

## 2022-11-26 DIAGNOSIS — I890 Lymphedema, not elsewhere classified: Secondary | ICD-10-CM | POA: Insufficient documentation

## 2022-11-27 NOTE — Progress Notes (Signed)
BOND, GOLDFINE ([id number]) [id number] Page 1 of 14 Visit Report for 11/26/2022 Chief Complaint Document Details Patient Name: Date of Service: [name], [name] 11/26/2022 12:30 PM Medical Record Number: [id number] Patient Account Number: [id number] Date of Birth/Sex: Treating RN: 10-14-1950 (72 y.o. M) Primary Care Provider: PA [name], NO Other Clinician: Referring Provider: Treating Provider/Extender: [name] in Treatment: 41 Information Obtained from: Patient Chief Complaint Patient seen for complaints of Non-Healing Wounds. Electronic Signature(s) Signed: 11/26/2022 3:53:11 PM By: Duanne Guess MD FACS Entered By: Duanne Guess on 11/26/2022 15:53:11 -------------------------------------------------------------------------------- Debridement Details Patient Name: Date of Service: [name], [name] 11/26/2022 12:30 PM Medical Record Number: [id number] Patient Account Number: [id number] Date of Birth/Sex: Treating RN: [birth year] (72 y.o. [name] Primary Care Provider: PA [name], [place] Other Clinician: Referring Provider: Treating Provider/Extender: [name] in Treatment: 41 Debridement Performed for Assessment: Wound #3 Right,Medial Lower Leg Performed By: Physician Duanne Guess, MD Debridement Type: Debridement Level of Consciousness (Pre-procedure): Awake and Alert Pre-procedure Verification/Time Out Yes - 13:00 Taken: Start Time: 13:01 Pain Control: Lidocaine 4% T opical Solution Percent of Wound Bed Debrided: 80% T Area Debrided (cm): otal 62.05 Tissue and other material debrided: Viable, Non-Viable, Slough, Subcutaneous, Slough Level: Skin/Subcutaneous Tissue Debridement Description: Excisional Instrument: Curette Bleeding: Minimum Hemostasis Achieved: Pressure Procedural Pain: 3 Post Procedural Pain: 2 Response to Treatment: Procedure was tolerated well Level of Consciousness  (Post- Awake and Alert procedure): Post Debridement Measurements of Total Wound Length: (cm) 10.4 Width: (cm) 9.5 Depth: (cm) 0.4 Volume: (cm) 31.039 Character of Wound/Ulcer Post Debridement: Improved Post Procedure Diagnosis Same as Pre-procedure [name], RN Electronic Signature(s) Signed: 11/26/2022 2:54:46 PM By: Zenaida Deed RN, BSN Signed: 11/26/2022 5:53:18 PM By: Duanne Guess MD FACS [name] ([id number]) [id number] Page 2 of 14 Entered By: Zenaida Deed on 11/26/2022 13:07:59 -------------------------------------------------------------------------------- HPI Details Patient Name: Date of Service: [name], [name] 11/26/2022 12:30 PM Medical Record Number: [id number] Patient Account Number: [id number] Date of Birth/Sex: Treating RN: 1950-08-12 (72 y.o. M) Primary Care Provider: PA [name], NO Other Clinician: Referring Provider: Treating Provider/Extender: [name] in Treatment: 41 History of Present Illness HPI Description: ADMISSION 02/09/2022 This is a 72 year old man who recently moved to [place] from [place]. He has a history of of coronary artery disease and prior left popliteal vein DVT . He is not diabetic and does not smoke. 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 He has had multiple spinal surgeries and has limited mobility. He primarily uses a motorized wheelchair. He has had lymphedema for many years and does have lymphedema pumps although he says he does not use them frequently. He has wounds on his bilateral lower extremities. He was receiving care in [place] for these prior to his move. They were apparently packing his wounds with Betadine soaked gauze. He has worn compression wraps in the past but not recently. He did say that they helped tremendously. In clinic today, his right ABI is noncompressible but has good signals; the left ABI was  1.17. No formal vascular studies have been performed. On his left dorsal foot, there is a circular lesion that exposes the fat layer. There is fairly heavy slough accumulation within the wound, but no significant odor. On his right medial calf, he has multiple pinholes that have slough buildup in them and probe for about 2 to 4 mm in depth. They are draining clear fluid. On his right  lateral midfoot, he has ulceration consistent with venous stasis. It is geographic and has slough accumulation. 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 02/17/2022: All of the wounds are little bit larger today and have more slough accumulation. Today, the medial calf openings are larger and probe to something that feels calcified. There is odor coming from his wounds. His vascular studies are scheduled for August 9 and 10. 02-24-2022 upon evaluation today patient presents for follow-up concerning his ongoing issues here with his lower extremities. With that being said he does have significant problems with what appears to be cellulitis unfortunately the PCR culture that was sent last week got crushed by [employer] in transit. With that being said we are going to reobtain a culture today although I am actually to do it on the right medial leg as this seems to be the most inflamed area today where there is some questionable drainage as well. I am going to remove some of the calcium deposits which are loosening obtain a deeper culture from this area. Fortunately I do not see any evidence of active infection systemically which is good news but locally I think we are having a bigger issue here. He does have his appointment next Thursday with vascular. Patient has also been referred to [facility] for further evaluation and treatment of the calcinosis for possible sulfate injections. 03/04/2022: The culture that was performed last week grew out  multiple species including Klebsiella, Morganella, and Pseudomonas aeruginosa. He had been prescribed Bactrim but this was not adequate to cover all of the species and levofloxacin was recommended. He completed the Bactrim but did not pick up the levofloxacin and begin taking it until yesterday. 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 He had arterial studies performed today. He does have evidence of peripheral arterial disease. Results copied here: +-------+-----------+-----------+------------+------------+ ABI/TBIT oday's ABIT oday's TBIPrevious ABIPrevious TBI +-------+-----------+-----------+------------+------------+ Right Trinity 0.75    +-------+-----------+-----------+------------+------------+ Left [place] 0.57    +-------+-----------+-----------+------------+------------+ Arterial wall calcification precludes accurate ankle pressures and ABIs. Summary: Right: Resting right ankle-brachial index indicates noncompressible right lower extremity arteries. The right toe-brachial index is normal. Left: Resting left ankle-brachial index indicates noncompressible left lower extremity arteries. The left toe-brachial index is abnormal. He continues to have further tissue breakdown at the right medial calf and there is ample slough accumulation along with necrotic subcutaneous tissue. The left dorsal foot wound has built up slough, as well. The right medial foot wound is nearly closed with just a light layer of eschar over the surface. The right dorsal lateral foot wound has also accumulated a fair amount of slough and remains quite tender. 03/10/2022: He has been taking the prescribed levofloxacin and has about 3 days left of this. The erythema and induration on his right leg has improved. He continues to experience  further breakdown of the right medial calf wound. There is extensive slough and debris accumulation there. There is heavy slough on his left dorsal foot wound, but no odor or purulent drainage. The right medial foot wound appears to be closed. The right dorsal lateral foot wound also has a fair amount of slough but it is less tender than at our last visit. He still does not have an appointment with dermatology. 03/22/2022: The right anterior tibial wound has closed. The  right lateral foot wound is nearly closed with just a little bit of slough. The left dorsal foot wound is smaller and continues to have some slough buildup but less so than at prior visits. There is good granulation tissue underlying the slough. Unfortunately the right medial calf wound continues to deteriorate. It has a foul odor today and a lot of necrotic tissue, the surrounding skin is also erythematous and moist. 03/30/2022: The right lateral foot wound continues to contract and just has a little bit of slough and eschar present. The left dorsal foot wound is also smaller with slough overlying good granulation tissue. The right medial calf wound actually looks quite a bit better today; there is no odor and there is much less necrotic tissue although some remains present. We have been using topical gentamicin and he has been taking oral Augmentin. 04/07/2022: The patient saw dermatology at [facility] last week. Unfortunately, it appears that they did not read any of the documentation that was provided. 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 Overall, it was a highly unsatisfactory  consultation. In the meantime, however, we were able to identify compounding pharmacy who could create a topical sodium thiosulfate ointment. The patient has that with him today. Both of the right foot wounds are now closed. The left dorsal foot wound has contracted but still has a layer of slough on the surface. The medial calf wounds on the right all look a little bit better. They still have heavy slough along with the chunks of calcium. Everything is stained purple. 04/14/2022: The wound on his dorsal left foot is a little bit smaller again today. There is still slough accumulation on the surface. The medial calf wounds have quite a bit less slough accumulation today. His right leg, however, is warm and erythematous, concerning for cellulitis. 04/14/2022: He completed his course of Augmentin yesterday. The medial calf wound sites are much cleaner today and shallower. There is less fragmented calcium in the wounds. He has a lot of lymphedema fluid-like drainage from these wounds, but no purulent discharge or malodor. The wound on his dorsal left foot has also contracted and is shallower. There is a layer of slough over healthy granulation tissue. 05/05/2022: The left dorsal foot wound is smaller today with less slough and more granulation tissue. The right medial calf wounds are shallower, but have extensive slough and some fat necrosis. The drainage has diminished considerably, however. 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 05/12/2022: The left dorsal foot wound continues to contract and fill with granulation tissue. There is slough on the wound surface. The right medial calf wounds also continue to fill with healthy tissue, but there is remaining slough and fat necrosis present. He had a significant increase in his drainage this week and it was a bright yellow-green color. He  also had a new wound open over his right anterior tibial surface associated with the spike of cutaneous calcium. The leg is more red, as well, concerning for infection. 05/19/2022: His culture returned positive for Pseudomonas. He is currently taking a course of oral levofloxacin. We  have also been using topical gentamicin mixed in with his sodium thiosulfate. All of the wounds look better this week. 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 There is slough and nonviable subcutaneous tissue still present. The new anterior tibial wound just has a light layer of slough. The dorsal foot wound also has some slough present and appears a little dry today. 05/26/2022: The right medial leg wound continues to improve. It is feeling with granulation tissue, but still accumulates a fairly thick layer of slough on the surface. The more recent anterior tibial wound has remained quite small, but also has some slough on the surface. The left dorsal foot wound has contracted somewhat and has perimeter epithelialization. He completed his oral levofloxacin. 06/02/2022: The left dorsal foot wound continues to improve significantly. There is just a little slough on the wound surface. The right medial leg wound had black drainage, but it looks like silver alginate was applied last week and I theorized that silver sulfide was created with a combination of the silver alginate and the sodium thiosulfate. It did, however, have a bit of an odor to it and extensive slough accumulation. The small anterior tibial wound also had a bit of slough and nonviable subcutaneous tissue present. The skin of his leg was rather macerated, as well. 06/09/2022: The left dorsal foot wound is smaller again this week with just a light layer of slough on the surface. The right medial leg wound looks substantially better. The leg and periwound skin are no longer red and macerated. There is good granulation tissue  forming with less slough and nonviable tissue. The anterior tibial wound just has a small amount of slough accumulation. 06/16/2022: The left dorsal foot wound continues to contract. His wrap slipped a little bit, however, and his foot is more swollen. The right medial leg wound continues to improve. The underlying tissue is more vital-appearing and there is less slough. He does have substantial drainage. The small anterior tibial wound has a bit of slough accumulation. 06/23/2022: The left dorsal foot wound is about half the size as it was last week. There is just some light slough on the surface and some eschar buildup around the edges. The right anterior leg wound is small with a little slough on the surface. Unfortunately, the right medial leg wound deteriorated fairly substantially. It is malodorous with gray/green/black discoloration. 06/30/2022: The left dorsal foot wound continues to contract. It is nearly closed with just a light layer of slough present. The right anterior leg wound is about the same size and has a small amount of slough on the surface. The right medial leg wound looks a lot better than it did last week. The odor has abated and the tissue is less pulpy and necrotic-looking. There is still a fairly thick layer of slough present. 12/13; the left dorsal foot wound looks very healthy. There was no need to change the dressing here and no debridement. He has a small area on the right anterior lower leg apparently had not changed much in size. The area on the medial leg is the most problematic area this is large with some depth and a completely nonviable surface today. 07/14/2022: The left dorsal foot wound is smaller today with just a little slough and eschar present. The right anterior lower leg wound is about the same without any significant change. The medial leg wound has a black and green surface and is malodorous. No purulent drainage and the periwound skin  is intact.  Edema control is suboptimal. 07/21/2022: The dorsal foot wound continues to contract and is nearly closed with just a little bit of slough and eschar on the surface. The right anterior lower leg wound is shallower today but otherwise the dimensions are unchanged. The medial leg wound looks significantly better although it still has a nonviable surface; the odor and black/green surface has resolved. His culture came back with multiple species sensitive to Augmentin, which was prescribed and a very low level of Pseudomonas, which should be handled by the topical gentamicin we have been using. 07/28/2022: The dorsal foot wound is down to just a very tiny opening with a little eschar on the surface. The right anterior tibial wound is about the same with some slough buildup. The right medial leg wound looks quite a bit better this week. There is thick rubbery slough on the surface, but the odor and drainage has improved significantly. 08/04/2022: The dorsal foot wound is almost healed, but the periwound skin has broken down. This appears to be secondary to moisture and adhesive from the absorbent pad. The right anterior tibial wound is shallower. The right medial leg wound continues to improve. It is smaller, still with rubbery slough on the surface. No malodor or purulent drainage. 08/11/2022: The dorsal foot wound is about the same size. It is a little bit dry with some slough accumulation. The right anterior tibial wound is also unchanged. The right medial leg wound is filling in further with good granulation tissue. Still with some slough buildup on the surface. 08/18/2022: His left leg is bright red, hot, and swollen with cellulitis. The dorsal foot wound actually looks quite good and is superficial with just a little bit of slough accumulation. The right anterior tibial wound is unchanged. The right medial leg wound has filled in with good granulation tissue but has copious amounts of  drainage. 08/25/2022: His cellulitis has responded nicely to the oral antibiotics. His dorsal foot wound is smaller but has a fair amount of slough buildup. The right anterior tibial wound remains stable and unchanged. The right medial leg wound has more granulation tissue under a layer of slough, but continues to have copious drainage. The drainage is actually causing skin irritation and threatens to result in breakdown. 09/01/2022: He continues to have profuse drainage from his wounds which is resulting in tissue maceration on both the dorsum of his left foot and the periwound distal to his medial leg wound. The dorsal foot wound is nearly healed, despite this. The medial right leg wound is filling in with good granulation tissue. We are working on getting home health assistance to change his dressings more frequently to try and control the drainage better. 09/08/2022: We have had success in controlling his drainage. The tissue maceration has improved significantly and his swelling has come down quite markedly. The medial right leg wound is much more superficial and has substantially less nonviable tissue present. The anterior tibial wound is a little bit larger, however with some nonviable tissue present. VALIN, HARTSEL ([id number]) [id number] Page 4 of 14 09/15/2022: The dorsal foot wound is nearly closed. The anterior tibial wound measures slightly larger today. The medial right leg wound continues to improve quite dramatically with less nonviable tissue and more robust-looking granulation. Edema control is significantly better. 09/22/2022: The left dorsal foot wound remains open, albeit quite tiny. The anterior tibial wound is a little bit larger again today. The medial right leg wound continues to improve with an increase in  robust and healthy granulation tissue and less accumulation of nonviable tissue. 09/29/2022: His home health providers wrapped his left leg extremely  tightly and he ended up having to cut the wrap off of his foot. This resulted in his foot being very swollen and that the dorsal foot wound is a little bit larger today. The anterior tibial wound is about the same size with a little slough on the surface. The medial right leg wound continues to fill in. There are very few areas with any significant depth. There is still slough accumulation. 3/13; Patient has a left dorsal foot wound along with a right anterior tibial wound and right medial wound. We are using silver alginate to the left dorsal foot wound, Santyl and sodium thiosulfate compound ointment to the right lower extremity wounds under 4 layer compression. Patient has no issues or complaints today. 10/13/2022: The left dorsal foot wound is a little bit bigger today, but his foot is also a little bit more swollen. The right medial leg wound has filled in more and has substantially less depth. There is still some nonviable tissue present. The small anterior lower leg wound looks about the same. 10/20/2022: The left dorsal foot wound is epithelializing. There does not appear to be much open at this site. There is some eschar around the edges. The right medial leg wound continues to fill in with granulation tissue. The small anterior lower leg wound is a little bit bigger and there is a chunk of calcium protruding. He continues to have fairly copious drainage from the right leg. 10/27/2022: His home health agency failed to show up at all this past week and as a result, the excessive drainage from his right leg wound has resulted in tissue breakdown. His leg is red and irritated. The wounds themselves look about the same. The wound on his left dorsal foot is nearly closed underneath some eschar. 11/03/2022: We continue to have difficulty with his home health agency. He continues to have significant drainage from his right leg wound which has resulted in even further tissue breakdown. He also was not  taking his Lasix as prescribed and I think much of the drainage has been his lymphedema fluid choosing the path of least resistance. The wound on his left dorsal foot is closed, but in the process of cleaning the site, dry skin was pulled off and he has a new small superficial wound just adjacent to where the dorsal foot wound was. 11/12/2022: His left dorsal foot is completely healed. The right medial leg continues to deteriorate. He is not taking his Lasix as prescribed, nor is he using his lymphedema pumps at all. As a result, he has massive amounts of drainage saturating his dressings and causing further tissue breakdown on his medial leg. Today, his dressings had a foul odor, as well. 11/19/2022: His right medial leg looks even worse today. There is more erythema and moisture related tissue breakdown. The 2 anterior wounds look a little bit better. The culture that I took last week did produce Pseudomonas and he is currently taking levofloxacin for this. 11/26/2022: There has been significant improvement to his right medial leg. It is far less raw and many of the superficial open areas have epithelialized. He continues to cut the foot portion of his wrap and as result his foot is bulbous and swollen compared to the rest of his leg, where edema control is good. The smaller of the 2 anterior tibial wound is closed, while the larger is  contracting somewhat with a bit of slough at the base. Electronic Signature(s) Signed: 11/26/2022 3:56:06 PM By: Duanne Guess MD FACS Entered By: Duanne Guess on 11/26/2022 15:56:06 -------------------------------------------------------------------------------- Physical Exam Details Patient Name: Date of Service: PAO, GHAN 11/26/2022 12:30 PM Medical Record Number: [id number] Patient Account Number: [id number] Date of Birth/Sex: Treating RN: 1951-06-22 (72 y.o. M) Primary Care Provider: PA [name], NO Other Clinician: Referring Provider: Treating  Provider/Extender: Luvenia Redden, Edwena Felty in Treatment: 41 Constitutional Slightly hypertensive. . . . no acute distress. Respiratory Normal work of breathing on room air. Notes 11/26/2022: There has been significant improvement to his right medial leg. It is far less raw and many of the superficial open areas have epithelialized. He continues to cut the foot portion of his wrap and as result his foot is bulbous and swollen compared to the rest of his leg, where edema control is good. The smaller of the 2 anterior tibial wound is closed, while the larger is contracting somewhat with a bit of slough at the base. Electronic Signature(s) Signed: 11/26/2022 3:56:33 PM By: Duanne Guess MD FACS Entered By: Duanne Guess on 11/26/2022 15:56:33 -------------------------------------------------------------------------------- Physician Orders Details Patient Name: Date of Service: ANIELLO, SERVI [name] 11/26/2022 12:30 PM Medical Record Number: [id number] Patient Account Number: [id number] Date of Birth/Sex: Treating RN: 04-24-1951 (72 y.o. 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 - Dr.  [name] Rm 1 Return A Friday 5/10 @ 12:30 pm Nurse Visit: - Tuesday 5/7 @ 11:15 am Anesthetic Wound #3 Right,Medial Lower Leg (In clinic) Topical Lidocaine 4% applied to wound bed Wound #6 Right,Anterior Lower Leg (In clinic) Topical Lidocaine 4% applied to wound bed Bathing/ Shower/ Hygiene May shower with protection but do not get wound dressing(s) wet. Protect dressing(s) with water repellant cover (for example, large plastic bag) or a cast cover and may then take shower. Edema Control - Lymphedema / SCD / Other Lymphedema Pumps. Use Lymphedema pumps on leg(s) 2-3 times a day for 45-60 minutes. If wearing any wraps or hose, do not remove them. Continue exercising as instructed. - use 1-2 times per day over wraps Elevate legs to the level of the heart or above for 30 minutes daily and/or when sitting for 3-4 times a day throughout the day. - throughout the day Avoid standing for long periods of time. Exercise regularly Wound Treatment Wound #3 - Lower Leg Wound Laterality: Right, Medial Peri-Wound Care: Triamcinolone 15 (g) 2 x Per Week/30 Days Discharge Instructions: Use triamcinolone 15 (g) as directed Peri-Wound Care: Zinc Oxide Ointment 30g tube 2 x Per Week/30 Days Discharge Instructions: Apply Zinc Oxide to excoriated periwound with each dressing change as needed Peri-Wound Care: Sween Lotion (Moisturizing lotion) 2 x Per Week/30 Days Discharge Instructions: Apply moisturizing lotion as directed Topical: Gentamicin 2 x Per Week/30 Days Discharge Instructions: mix 50:50 with mupirocin to wound bed Prim Dressing: Cutimed  Sorbact Swab 2 x Per Week/30 Days ary Discharge Instructions: Apply to wound bed as instructed Secondary Dressing: ABD Pad, 8x10 (Generic) 2 x Per Week/30 Days Discharge Instructions: Apply over primary dressing as directed. Secondary Dressing: Zetuvit Plus 4x8 in 2 x Per Week/30 Days Discharge Instructions: Apply over primary dressing as  directed. Compression Wrap: ThreePress (3 layer compression wrap) 2 x Per Week/30 Days Discharge Instructions: Apply three layer compression as directed. Wound #6 - Lower Leg Wound Laterality: Right, Anterior Peri-Wound Care: Triamcinolone 15 (g) 2 x Per Week/30 Days Discharge Instructions: Use triamcinolone 15 (g) as directed Peri-Wound Care: Zinc Oxide Ointment 30g tube 2 x Per Week/30 Days Discharge Instructions: Apply Zinc Oxide to excoriated periwound with each dressing change as needed Peri-Wound Care: Sween Lotion (Moisturizing lotion) 2 x Per Week/30 Days JAISON, [name] ([id number]) [id number] Page 6 of 14 Discharge Instructions: Apply moisturizing lotion as directed Topical: Gentamicin 2 x Per Week/30 Days Discharge Instructions: mix 50:50 with mupirocin to wound bed Prim Dressing: Cutimed Sorbact Swab 2 x Per Week/30 Days ary Discharge Instructions: Apply to wound bed as instructed Secondary Dressing: ABD Pad, 8x10 (Generic) 2 x Per Week/30 Days Discharge Instructions: Apply over primary dressing as directed. Secondary Dressing: Zetuvit Plus 4x8 in 2 x Per Week/30 Days Discharge Instructions: Apply over primary dressing as directed. Compression Wrap: ThreePress (3 layer compression wrap) 2 x Per Week/30 Days Discharge Instructions: Apply three layer compression as directed. Electronic Signature(s) Signed: 11/26/2022 5:53:18 PM By: Duanne Guess MD FACS Previous Signature: 11/26/2022 2:54:46 PM Version By: Zenaida Deed RN, BSN Entered By: Duanne Guess on 11/26/2022 15:57:11 -------------------------------------------------------------------------------- Problem List Details Patient Name: Date of Service: TRAMAIN, [name] 11/26/2022 12:30 PM Medical Record Number: [id number] Patient Account Number: [id number] Date of Birth/Sex: Treating RN: September 27, 1950 (73 y.o. 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) 02/09/2022 No Yes I10 Essential (primary) hypertension 02/09/2022 No Yes I25.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 angina pectoris 02/09/2022 No Yes I89.0 Lymphedema, not elsewhere classified 02/09/2022 No Yes Z86.718 Personal history of other venous thrombosis and embolism 02/09/2022 No Yes [name], [name] ([id number]) [id number] Page 7 of 14 Inactive Problems ICD-10 Code Description Active Date Inactive Date L97.512 Non-pressure chronic ulcer of other part of right foot with fat layer exposed 02/17/2022 02/17/2022 Resolved Problems Electronic Signature(s) Signed: 11/26/2022 3:50:55 PM By: Duanne Guess MD FACS Previous Signature: 11/26/2022 2:54:46 PM Version By: Zenaida Deed RN, BSN Entered By: Duanne Guess on 11/26/2022 15:50:54 -------------------------------------------------------------------------------- Progress Note Details Patient Name: Date of Service: NASEAN, HIGINBOTHAM 11/26/2022 12:30 PM Medical Record Number: [id number] Patient Account Number: [id number] Date of Birth/Sex: Treating RN: 04/22/1951 (72 y.o. M) Primary Care Provider: PA [name], NO Other Clinician: Referring Provider: Treating Provider/Extender: [name] in Treatment: 41 Subjective Chief Complaint Information obtained from Patient Patient seen for complaints of Non-Healing Wounds. History of Present Illness (HPI) ADMISSION 02/09/2022 This is a 72 year old man who  recently moved to [place] from [place]. He has a history of of coronary artery disease and prior left popliteal vein DVT . He is  not diabetic and does not smoke. 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 He has had multiple spinal surgeries and has limited mobility. He primarily uses a motorized wheelchair. He has had lymphedema for many years and does have lymphedema pumps although he says he does not use them frequently. He has wounds on his bilateral lower extremities. He was receiving care in [place] for these prior to his move. They were apparently packing his wounds with Betadine soaked gauze. He has worn compression wraps in the past but not recently. He did say that they helped tremendously. In clinic today, his right ABI is noncompressible but has good signals; the left ABI was 1.17. No formal vascular studies have been performed. On his left dorsal foot, there is a circular lesion that exposes the fat layer. There is fairly heavy slough accumulation within the wound, but no significant odor. On his right medial calf, he has multiple pinholes that have slough buildup in them and probe for about 2 to 4 mm in depth. They are draining clear fluid. On his right lateral midfoot, he has ulceration consistent with venous stasis. It is geographic and has slough accumulation. 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 02/17/2022: All of the wounds are little bit larger today and have more slough accumulation. Today, the medial calf openings are larger and probe to something that feels calcified. There is odor coming from his wounds. His vascular studies are scheduled for August 9 and 10. 02-24-2022 upon evaluation today patient presents for follow-up concerning his ongoing issues here with his lower extremities. With that being said he does have significant problems with what appears to be  cellulitis unfortunately the PCR culture that was sent last week got crushed by [employer] in transit. With that being said we are going to reobtain a culture today although I am actually to do it on the right medial leg as this seems to be the most inflamed area today where there is some questionable drainage as well. I am going to remove some of the calcium deposits which are loosening obtain a deeper culture from this area. Fortunately I do not see any evidence of active infection systemically which is good news but locally I think we are having a bigger issue here. He does have his appointment next Thursday with vascular. Patient has also been referred to [facility] for further evaluation and treatment of the calcinosis for possible sulfate injections. 03/04/2022: The culture that was performed last week grew out multiple species including Klebsiella, Morganella, and Pseudomonas aeruginosa. He had been prescribed Bactrim but this was not adequate to cover all of the species and levofloxacin was recommended. He completed the Bactrim but did not pick up the levofloxacin and begin taking it until yesterday. 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 He had arterial studies performed today. He does have evidence of peripheral arterial disease. Results copied here: +-------+-----------+-----------+------------+------------+ ABI/TBIT oday's ABIT oday's TBIPrevious ABIPrevious TBI +-------+-----------+-----------+------------+------------+ Right [place] 0.75    +-------+-----------+-----------+------------+------------+ Left [place] 0.57    +-------+-----------+-----------+------------+------------+ Arterial wall calcification precludes accurate ankle pressures and ABIs. Summary: Right: Resting right ankle-brachial index indicates  noncompressible right lower extremity arteries. The right toe-brachial index is normal. JARMARCUS, YSAGUIRRE ([id number]) [id number] Page 8 of 14 Left: Resting left ankle-brachial  index indicates noncompressible left lower extremity arteries. The left toe-brachial index is abnormal. He continues to have further tissue breakdown at the right medial calf and there is ample slough accumulation along with necrotic subcutaneous tissue. The left dorsal foot wound has built up slough, as well. The right medial foot wound is nearly closed with just a light layer of eschar over the surface. The right dorsal lateral foot wound has also accumulated a fair amount of slough and remains quite tender. 03/10/2022: He has been taking the prescribed levofloxacin and has about 3 days left of this. The erythema and induration on his right leg has improved. He continues to experience further breakdown of the right medial calf wound. There is extensive slough and debris accumulation there. There is heavy slough on his left dorsal foot wound, but no odor or purulent drainage. The right medial foot wound appears to be closed. The right dorsal lateral foot wound also has a fair amount of slough but it is less tender than at our last visit. He still does not have an appointment with dermatology. 03/22/2022: The right anterior tibial wound has closed. The right lateral foot wound is nearly closed with just a little bit of slough. The left dorsal foot wound is smaller and continues to have some slough buildup but less so than at prior visits. There is good granulation tissue underlying the slough. Unfortunately the right medial calf wound continues to deteriorate. It has a foul odor today and a lot of necrotic tissue, the surrounding skin is also erythematous and moist. 03/30/2022: The right lateral foot wound continues to contract and just has a little bit of slough and eschar present. The left dorsal foot wound  is also smaller with slough overlying good granulation tissue. The right medial calf wound actually looks quite a bit better today; there is no odor and there is much less necrotic tissue although some remains present. We have been using topical gentamicin and he has been taking oral Augmentin. 04/07/2022: The patient saw dermatology at [facility] last week. Unfortunately, it appears that they did not read any of the documentation that was provided. 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 Overall, it was a highly unsatisfactory consultation. In the meantime, however, we were able to identify compounding pharmacy who could create a topical sodium thiosulfate ointment. The patient has that with him today. Both of the right foot wounds are now closed. The left dorsal foot wound has contracted but still has a layer of slough on the surface. The medial calf wounds on the right all look a little bit better. They still have heavy slough along with the chunks of calcium. Everything is stained purple. 04/14/2022: The wound on his dorsal left foot is a little bit smaller again today. There is still slough accumulation on the surface. The medial calf wounds have quite a bit less slough accumulation today. His right leg, however, is warm and erythematous, concerning for cellulitis. 04/14/2022: He completed his course of Augmentin yesterday. The medial calf wound sites are much cleaner today and shallower. There is less fragmented calcium in the wounds. He has a lot of lymphedema fluid-like drainage from these wounds, but no purulent discharge or malodor. The wound on his dorsal left foot  has also contracted and is shallower. There is a layer of  slough over healthy granulation tissue. 05/05/2022: The left dorsal foot wound is smaller today with less slough and more granulation tissue. The right medial calf wounds are shallower, but have extensive slough and some fat necrosis. The drainage has diminished considerably, however. 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 05/12/2022: The left dorsal foot wound continues to contract and fill with granulation tissue. There is slough on the wound surface. The right medial calf wounds also continue to fill with healthy tissue, but there is remaining slough and fat necrosis present. He had a significant increase in his drainage this week and it was a bright yellow-green color. He also had a new wound open over his right anterior tibial surface associated with the spike of cutaneous calcium. The leg is more red, as well, concerning for infection. 05/19/2022: His culture returned positive for Pseudomonas. He is currently taking a course of oral levofloxacin. We have also been using topical gentamicin mixed in with his sodium thiosulfate. All of the wounds look better this week. 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 There is slough and nonviable subcutaneous tissue still present. The new anterior tibial wound just has a light layer of slough. The dorsal foot wound also has some slough present and appears a little dry today. 05/26/2022: The right medial leg wound continues to improve. It is feeling with granulation tissue, but still accumulates a fairly thick layer of slough on the surface. The more recent anterior tibial wound has remained quite small, but also has some slough on the surface. The left dorsal foot wound has contracted somewhat and has perimeter epithelialization. He completed his oral levofloxacin. 06/02/2022: The left dorsal foot wound  continues to improve significantly. There is just a little slough on the wound surface. The right medial leg wound had black drainage, but it looks like silver alginate was applied last week and I theorized that silver sulfide was created with a combination of the silver alginate and the sodium thiosulfate. It did, however, have a bit of an odor to it and extensive slough accumulation. The small anterior tibial wound also had a bit of slough and nonviable subcutaneous tissue present. The skin of his leg was rather macerated, as well. 06/09/2022: The left dorsal foot wound is smaller again this week with just a light layer of slough on the surface. The right medial leg wound looks substantially better. The leg and periwound skin are no longer red and macerated. There is good granulation tissue forming with less slough and nonviable tissue. The anterior tibial wound just has a small amount of slough accumulation. 06/16/2022: The left dorsal foot wound continues to contract. His wrap slipped a little bit, however, and his foot is more swollen. The right medial leg wound continues to improve. The underlying tissue is more vital-appearing and there is less slough. He does have substantial drainage. The small anterior tibial wound has a bit of slough accumulation. 06/23/2022: The left dorsal foot wound is about half the size as it was last week. There is just some light slough on the surface and some eschar buildup around the edges. The right anterior leg wound is small with a little slough on the surface. Unfortunately, the right medial leg wound deteriorated fairly substantially. It is malodorous with gray/green/black discoloration. 06/30/2022: The left  dorsal foot wound continues to contract. It is nearly closed with just a light layer of slough present. The right anterior leg wound is about the same size and has a small amount of slough on the surface. The right medial leg wound looks a lot better than it  did last week. The odor has abated and the tissue is less pulpy and necrotic-looking. There is still a fairly thick layer of slough present. 12/13; the left dorsal foot wound looks very healthy. There was no need to change the dressing here and no debridement. He has a small area on the right anterior lower leg apparently had not changed much in size. The area on the medial leg is the most problematic area this is large with some depth and a completely nonviable surface today. 07/14/2022: The left dorsal foot wound is smaller today with just a little slough and eschar present. The right anterior lower leg wound is about the same without any significant change. The medial leg wound has a black and green surface and is malodorous. No purulent drainage and the periwound skin is intact. Edema control is suboptimal. 07/21/2022: The dorsal foot wound continues to contract and is nearly closed with just a little bit of slough and eschar on the surface. The right anterior lower leg wound is shallower today but otherwise the dimensions are unchanged. The medial leg wound looks significantly better although it still has a nonviable surface; the odor and black/green surface has resolved. His culture came back with multiple species sensitive to Augmentin, which was prescribed and a very low level of Pseudomonas, which should be handled by the topical gentamicin we have been using. 07/28/2022: The dorsal foot wound is down to just a very tiny opening with a little eschar on the surface. The right anterior tibial wound is about the same with some slough buildup. The right medial leg wound looks quite a bit better this week. There is thick rubbery slough on the surface, but the odor and drainage has improved significantly. [name], [name] ([id number]) [id number] Page 9 of 14 08/04/2022: The dorsal foot wound is almost healed, but the periwound skin has broken down. This appears to be secondary  to moisture and adhesive from the absorbent pad. The right anterior tibial wound is shallower. The right medial leg wound continues to improve. It is smaller, still with rubbery slough on the surface. No malodor or purulent drainage. 08/11/2022: The dorsal foot wound is about the same size. It is a little bit dry with some slough accumulation. The right anterior tibial wound is also unchanged. The right medial leg wound is filling in further with good granulation tissue. Still with some slough buildup on the surface. 08/18/2022: His left leg is bright red, hot, and swollen with cellulitis. The dorsal foot wound actually looks quite good and is superficial with just a little bit of slough accumulation. The right anterior tibial wound is unchanged. The right medial leg wound has filled in with good granulation tissue but has copious amounts of drainage. 08/25/2022: His cellulitis has responded nicely to the oral antibiotics. His dorsal foot wound is smaller but has a fair amount of slough buildup. The right anterior tibial wound remains stable and unchanged. The right medial leg wound has more granulation tissue under a layer of slough, but continues to have copious drainage. The drainage is actually causing skin irritation and threatens to result in breakdown. 09/01/2022: He continues to have profuse drainage from his wounds which is resulting in tissue  maceration on both the dorsum of his left foot and the periwound distal to his medial leg wound. The dorsal foot wound is nearly healed, despite this. The medial right leg wound is filling in with good granulation tissue. We are working on getting home health assistance to change his dressings more frequently to try and control the drainage better. 09/08/2022: We have had success in controlling his drainage. The tissue maceration has improved significantly and his swelling has come down quite markedly. The medial right leg wound is much more superficial and  has substantially less nonviable tissue present. The anterior tibial wound is a little bit larger, however with some nonviable tissue present. 09/15/2022: The dorsal foot wound is nearly closed. The anterior tibial wound measures slightly larger today. The medial right leg wound continues to improve quite dramatically with less nonviable tissue and more robust-looking granulation. Edema control is significantly better. 09/22/2022: The left dorsal foot wound remains open, albeit quite tiny. The anterior tibial wound is a little bit larger again today. The medial right leg wound continues to improve with an increase in robust and healthy granulation tissue and less accumulation of nonviable tissue. 09/29/2022: His home health providers wrapped his left leg extremely tightly and he ended up having to cut the wrap off of his foot. This resulted in his foot being very swollen and that the dorsal foot wound is a little bit larger today. The anterior tibial wound is about the same size with a little slough on the surface. The medial right leg wound continues to fill in. There are very few areas with any significant depth. There is still slough accumulation. 3/13; Patient has a left dorsal foot wound along with a right anterior tibial wound and right medial wound. We are using silver alginate to the left dorsal foot wound, Santyl and sodium thiosulfate compound ointment to the right lower extremity wounds under 4 layer compression. Patient has no issues or complaints today. 10/13/2022: The left dorsal foot wound is a little bit bigger today, but his foot is also a little bit more swollen. The right medial leg wound has filled in more and has substantially less depth. There is still some nonviable tissue present. The small anterior lower leg wound looks about the same. 10/20/2022: The left dorsal foot wound is epithelializing. There does not appear to be much open at this site. There is some eschar around the edges.  The right medial leg wound continues to fill in with granulation tissue. The small anterior lower leg wound is a little bit bigger and there is a chunk of calcium protruding. He continues to have fairly copious drainage from the right leg. 10/27/2022: His home health agency failed to show up at all this past week and as a result, the excessive drainage from his right leg wound has resulted in tissue breakdown. His leg is red and irritated. The wounds themselves look about the same. The wound on his left dorsal foot is nearly closed underneath some eschar. 11/03/2022: We continue to have difficulty with his home health agency. He continues to have significant drainage from his right leg wound which has resulted in even further tissue breakdown. He also was not taking his Lasix as prescribed and I think much of the drainage has been his lymphedema fluid choosing the path of least resistance. The wound on his left dorsal foot is closed, but in the process of cleaning the site, dry skin was pulled off and he has a new small superficial  wound just adjacent to where the dorsal foot wound was. 11/12/2022: His left dorsal foot is completely healed. The right medial leg continues to deteriorate. He is not taking his Lasix as prescribed, nor is he using his lymphedema pumps at all. As a result, he has massive amounts of drainage saturating his dressings and causing further tissue breakdown on his medial leg. Today, his dressings had a foul odor, as well. 11/19/2022: His right medial leg looks even worse today. There is more erythema and moisture related tissue breakdown. The 2 anterior wounds look a little bit better. The culture that I took last week did produce Pseudomonas and he is currently taking levofloxacin for this. 11/26/2022: There has been significant improvement to his right medial leg. It is far less raw and many of the superficial open areas have epithelialized. He continues to cut the foot portion of  his wrap and as result his foot is bulbous and swollen compared to the rest of his leg, where edema control is good. The smaller of the 2 anterior tibial wound is closed, while the larger is contracting somewhat with a bit of slough at the base. Patient History Information obtained from Patient, Chart. Family History Cancer - Mother, Heart Disease - Father,Mother, Hypertension - Father,Siblings, Lung Disease - Siblings, No family history of Diabetes, Hereditary Spherocytosis, Kidney Disease, Seizures, Stroke, Thyroid Problems, Tuberculosis. Social History Former smoker - quit 1991, Marital Status - Married, Alcohol Use - Moderate, Drug Use - No History, Caffeine Use - Daily - coffee. Medical History Eyes Patient has history of Cataracts - right eye removed Denies history of Glaucoma, Optic Neuritis Ear/Nose/Mouth/Throat Denies history of Chronic sinus problems/congestion, Middle ear problems Hematologic/Lymphatic Patient has history of Anemia - macrocytic, Lymphedema Cardiovascular Patient has history of Congestive Heart Failure, Deep Vein Thrombosis - left leg, Hypertension, Peripheral Venous Disease Endocrine Denies history of Type I Diabetes, Type II Diabetes Genitourinary Denies history of End Stage Renal Disease Integumentary (Skin) Denies history of History of Burn Oncologic [name], [name] ([id number]) [id number] Page 10 of 14 Denies history of Received Chemotherapy, Received Radiation Psychiatric Denies history of Anorexia/bulimia, Confinement Anxiety Hospitalization/Surgery History - cervical fusion. - lumbar surgery. - coronary stent placement. - lasik eye surgery. - hydrocele excision. Medical A Surgical History Notes nd Constitutional Symptoms (General Health) obesity Ear/Nose/Mouth/Throat hard of hearing Respiratory frozen diaphragm right Cardiovascular hyperlipidemia Musculoskeletal myositis, lumbar DDD, spinal stenosis, cervical facet  joint syndrome Objective Constitutional Slightly hypertensive. no acute distress. Vitals Time Taken: 12:36 PM, Height: 71 in, Weight: 267 lbs, BMI: 37.2, Temperature: 97.7 F, Pulse: 73 bpm, Respiratory Rate: 20 breaths/min, Blood Pressure: 142/71 mmHg. Respiratory Normal work of breathing on room air. General Notes: 11/26/2022: There has been significant improvement to his right medial leg. It is far less raw and many of the superficial open areas have epithelialized. He continues to cut the foot portion of his wrap and as result his foot is bulbous and swollen compared to the rest of his leg, where edema control is good. The smaller of the 2 anterior tibial wound is closed, while the larger is contracting somewhat with a bit of slough at the base. Integumentary (Hair, Skin) Wound #3 status is Open. Original cause of wound was Gradually Appeared. The date acquired was: 12/07/2021. The wound has been in treatment 41 weeks. The wound is located on the Right,Medial Lower Leg. The wound measures 10.4cm length x 9.5cm width x 0.4cm depth; 77.597cm^2 area and 31.039cm^3 volume. There is Fat Layer (Subcutaneous Tissue) exposed.  There is no tunneling or undermining noted. There is a large amount of serosanguineous drainage noted. The wound margin is distinct with the outline attached to the wound base. There is medium (34-66%) red granulation within the wound bed. There is a medium (34-66%) amount of necrotic tissue within the wound bed including Adherent Slough. The periwound skin appearance had no abnormalities noted for texture. The periwound skin appearance exhibited: Hemosiderin Staining. The periwound skin appearance did not exhibit: Dry/Scaly, Maceration, Rubor, Erythema. Periwound temperature was noted as No Abnormality. The periwound has tenderness on palpation. Wound #6 status is Open. Original cause of wound was Gradually Appeared. The date acquired was: 05/12/2022. The wound has been in  treatment 28 weeks. The wound is located on the Right,Anterior Lower Leg. The wound measures 0.7cm length x 0.4cm width x 0.3cm depth; 0.22cm^2 area and 0.066cm^3 volume. There is Fat Layer (Subcutaneous Tissue) exposed. There is no tunneling or undermining noted. There is a medium amount of purulent drainage noted. The wound margin is epibole. There is medium (34-66%) red granulation within the wound bed. There is a small (1-33%) amount of necrotic tissue within the wound bed including Adherent Slough. The periwound skin appearance had no abnormalities noted for texture. The periwound skin appearance had no abnormalities noted for moisture. The periwound skin appearance exhibited: Hemosiderin Staining. The periwound skin appearance did not exhibit: Atrophie Blanche, Cyanosis, Ecchymosis, Mottled, Pallor, Rubor, Erythema. Periwound temperature was noted as No Abnormality. The periwound has tenderness on palpation. 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 There was a Excisional Skin/Subcutaneous Tissue JACAIDEN, DUXBURY ([id number]) [id number] Page 11 of 14 Debridement with a total area of 62.05 sq cm performed by Duanne Guess, MD. With the following instrument(s): Curette to remove Viable and Non-Viable tissue/material. Material removed includes Subcutaneous Tissue and Slough and after achieving pain control using Lidocaine 4% T opical  Solution. No specimens were taken. A time out was conducted at 13:00, prior to the start of the procedure. A Minimum amount of bleeding was controlled with Pressure. The procedure was tolerated well with a pain level of 3 throughout and a pain level of 2 following the procedure. Post Debridement Measurements: 10.4cm length x 9.5cm width x 0.4cm depth; 31.039cm^3 volume. Character of Wound/Ulcer Post Debridement is improved. Post procedure Diagnosis Wound #3: Same as Pre-Procedure General Notes: scribed for Dr. [name] by Zenaida Deed, RN. Pre-procedure diagnosis of Wound #3 is a Lymphedema located on the Right,Medial Lower Leg . There was a Three Layer Compression Therapy Procedure by Zenaida Deed, RN. Post procedure Diagnosis Wound #3: Same as Pre-Procedure Plan Follow-up Appointments: Return Appointment in 1 week. - Dr. [name] Rm 1 Friday 5/10 @ 12:30 pm Nurse Visit: - Tuesday 5/7 @ 11:15 am Anesthetic: Wound #3 Right,Medial Lower Leg: (In clinic) Topical Lidocaine 4% applied to wound bed Wound #6 Right,Anterior Lower Leg: (In clinic) Topical Lidocaine 4% applied to wound bed Bathing/ Shower/ Hygiene: May shower with protection but do not get wound dressing(s) wet. Protect dressing(s) with water repellant cover (for example, large plastic bag) or a cast cover and  may then take shower. Edema Control - Lymphedema / SCD / Other: Lymphedema Pumps. Use Lymphedema pumps on leg(s) 2-3 times a day for 45-60 minutes. If wearing any wraps or hose, do not remove them. Continue exercising as instructed. - use 1-2 times per day over wraps Elevate legs to the level of the heart or above for 30 minutes daily and/or when sitting for 3-4 times a day throughout the day. - throughout the day Avoid standing for long periods of time. Exercise regularly WOUND #3: - Lower Leg Wound Laterality: Right, Medial Peri-Wound Care: Triamcinolone 15 (g) 2 x Per Week/30 Days Discharge Instructions: Use  triamcinolone 15 (g) as directed Peri-Wound Care: Zinc Oxide Ointment 30g tube 2 x Per Week/30 Days Discharge Instructions: Apply Zinc Oxide to excoriated periwound with each dressing change as needed Peri-Wound Care: Sween Lotion (Moisturizing lotion) 2 x Per Week/30 Days Discharge Instructions: Apply moisturizing lotion as directed Topical: Gentamicin 2 x Per Week/30 Days Discharge Instructions: mix 50:50 with mupirocin to wound bed Prim Dressing: Cutimed Sorbact Swab 2 x Per Week/30 Days ary Discharge Instructions: Apply to wound bed as instructed Secondary Dressing: ABD Pad, 8x10 (Generic) 2 x Per Week/30 Days Discharge Instructions: Apply over primary dressing as directed. Secondary Dressing: Zetuvit Plus 4x8 in 2 x Per Week/30 Days Discharge Instructions: Apply over primary dressing as directed. Com pression Wrap: ThreePress (3 layer compression wrap) 2 x Per Week/30 Days Discharge Instructions: Apply three layer compression as directed. WOUND #6: - Lower Leg Wound Laterality: Right, Anterior Peri-Wound Care: Triamcinolone 15 (g) 2 x Per Week/30 Days Discharge Instructions: Use triamcinolone 15 (g) as directed Peri-Wound Care: Zinc Oxide Ointment 30g tube 2 x Per Week/30 Days Discharge Instructions: Apply Zinc Oxide to excoriated periwound with each dressing change as needed Peri-Wound Care: Sween Lotion (Moisturizing lotion) 2 x Per Week/30 Days Discharge Instructions: Apply moisturizing lotion as directed Topical: Gentamicin 2 x Per Week/30 Days Discharge Instructions: mix 50:50 with mupirocin to wound bed Prim Dressing: Cutimed Sorbact Swab 2 x Per Week/30 Days ary Discharge Instructions: Apply to wound bed as instructed Secondary Dressing: ABD Pad, 8x10 (Generic) 2 x Per Week/30 Days Discharge Instructions: Apply over primary dressing as directed. Secondary Dressing: Zetuvit Plus 4x8 in 2 x Per Week/30 Days Discharge Instructions: Apply over primary dressing as directed. Com  pression Wrap: ThreePress (3 layer compression wrap) 2 x Per Week/30 Days Discharge Instructions: Apply three layer compression as directed. 11/26/2022: There has been significant improvement to his right medial leg. It is far less raw and many of the superficial open areas have epithelialized. He continues to cut the foot portion of his wrap and as result his foot is bulbous and swollen compared to the rest of his leg, where edema control is good. The smaller of the 2 anterior tibial wound is closed, while the larger is contracting somewhat with a bit of slough at the base. I used a curette to debride subcutaneous tissue and slough from his wounds. We will continue topical gentamicin with Sorbact superabsorbent pad under 3 layer compression. He will have a nurse visit early in the week to change his dressing and follow-up with me a week from today. Electronic Signature(s) Signed: 11/26/2022 3:59:02 PM By: Duanne Guess MD FACS Entered By: Duanne Guess on 11/26/2022 15:59:02 [name] ([id number]) [id number] Page 12 of 14 -------------------------------------------------------------------------------- HxROS Details Patient Name: Date of Service: [name], [name] 11/26/2022 12:30 PM Medical Record Number: [id number] Patient Account Number: [id number] Date of Birth/Sex: Treating RN: 03-02-1951 (  72 y.o. M) Primary Care Provider: PA [name], NO Other Clinician: Referring Provider: Treating Provider/Extender: [name] in Treatment: 41 Information Obtained From Patient Chart Constitutional Symptoms (General Health) Medical History: Past Medical History Notes: obesity Eyes Medical History: Positive for: Cataracts - right eye removed Negative for: Glaucoma; Optic Neuritis Ear/Nose/Mouth/Throat Medical History: Negative for: Chronic sinus problems/congestion; Middle ear problems Past Medical History Notes: hard of  hearing Hematologic/Lymphatic Medical History: Positive for: Anemia - macrocytic; Lymphedema Respiratory Medical History: Past Medical History Notes: frozen diaphragm right Cardiovascular Medical History: Positive for: Congestive Heart Failure; Deep Vein Thrombosis - left leg; Hypertension; Peripheral Venous Disease Past Medical History Notes: hyperlipidemia Endocrine Medical History: Negative for: Type I Diabetes; Type II Diabetes Genitourinary Medical History: Negative for: End Stage Renal Disease Integumentary (Skin) Medical History: Negative for: History of Burn Musculoskeletal Medical History: Past Medical History Notes: myositis, lumbar DDD, spinal stenosis, cervical facet joint syndrome Oncologic Medical History: Negative for: Received Chemotherapy; Received Radiation [name], [name] ([id number]) [id number] Page 13 of 14 Psychiatric Medical History: Negative for: Anorexia/bulimia; Confinement Anxiety HBO Extended History Items Eyes: Cataracts Immunizations Pneumococcal Vaccine: Received Pneumococcal Vaccination: Yes Received Pneumococcal Vaccination On or After 60th Birthday: Yes Implantable Devices No devices added Hospitalization / Surgery History Type of Hospitalization/Surgery cervical fusion lumbar surgery coronary stent placement lasik eye surgery hydrocele excision Family and Social History Cancer: Yes - Mother; Diabetes: No; Heart Disease: Yes - Father,Mother; Hereditary Spherocytosis: No; Hypertension: Yes - Father,Siblings; Kidney Disease: No; Lung Disease: Yes - Siblings; Seizures: No; Stroke: No; Thyroid Problems: No; Tuberculosis: No; Former smoker - quit 1991; Marital Status - Married; Alcohol Use: Moderate; Drug Use: No History; Caffeine Use: Daily - coffee; Financial Concerns: No; Food, Clothing or Shelter Needs: No; Support System Lacking: No; Transportation Concerns: No Electronic Signature(s) Signed: 11/26/2022  5:53:18 PM By: Duanne Guess MD FACS Entered By: Duanne Guess on 11/26/2022 15:56:12 -------------------------------------------------------------------------------- SuperBill Details Patient Name: Date of Service: ALBIE, CORDRAY 11/26/2022 Medical Record Number: [id number] Patient Account Number: [id number] Date of Birth/Sex: Treating RN: [birth year] (72 y.o. M) Primary Care Provider: PA [name], NO Other Clinician: Referring Provider: Treating Provider/Extender: [name] in Treatment: 41 Diagnosis Coding ICD-10 Codes Code Description [phone] Non-pressure chronic ulcer of other part of right lower leg with fat layer exposed L97.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) I10 Essential (primary) hypertension I25.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 angina pectoris I89.0 Lymphedema, not elsewhere classified Z86.718 Personal history of other venous thrombosis and embolism Facility Procedures : CPT4 Code: [id number] Description: 11042 - DEB SUBQ TISSUE 20 SQ CM/< ICD-10 Diagnosis Description L97.812 Non-pressure chronic ulcer of other part of right lower leg with fat layer exp Modifier: osed Quantity: 1 : Mowrer, RO CPT4 Code: [id number] [name] ([id number]) Description: 11045 - DEB SUBQ TISS EA ADDL 20CM ICD-10 Diagnosis Description L97.812 Non-pressure chronic ulcer of other part of right lower leg with fat layer exp [phone] Modifier: osed [id number] Quantity: 3 7.pdf Page 14 of 14 Physician Procedures : CPT4 Code Description Modifier [id number] - WC PHYS LEVEL 4 - EST PT 25 ICD-10 Diagnosis Description 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) I89.0 Lymphedema, not elsewhere  classified Quantity: 1 : [id number] 11042 - WC PHYS SUBQ TISS 20 SQ CM ICD-10 Diagnosis Description L97.812 Non-pressure chronic  ulcer of other part of right lower leg with fat layer exposed Quantity: 1 : [id number] 11045 - WC PHYS SUBQ TISS EA ADDL 20 CM ICD-10 Diagnosis Description 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ed Quantity: 3 Electronic Signature(s) Signed: 11/26/2022 3:59:33 PM By: Duanne Guess MD FACS Entered By: Duanne Guess on 11/26/2022 15:59:32

## 2022-11-27 NOTE — Progress Notes (Addendum)
[name] ([id number]) [id number] Page 1 of 10 Visit Report for 11/26/2022 Arrival Information Details Patient Name: Date of Service: [name], [name] 11/26/2022 12:30 PM Medical Record Number: [id number] Patient Account Number: [id number] Date of Birth/Sex: Treating RN: 10/10/1950 (72 y.o. [name] Primary Care Floria Brandau: PA [name], [place] Other Clinician: Referring Jessica Checketts: Treating Nevyn Bossman/Extender: [name] in Treatment: 17 Visit Information History Since Last Visit Added or deleted any medications: No Patient Arrived: Wheel Chair Any new allergies or adverse reactions: No Arrival Time: 12:35 Had a fall or experienced change in No Accompanied By: self activities of daily living that may affect Transfer Assistance: None risk of falls: Patient Identification Verified: Yes Signs or symptoms of abuse/neglect since last visito No Secondary Verification Process Completed: Yes Hospitalized since last visit: No Patient Requires Transmission-Based Precautions: No Implantable device outside of the clinic excluding No Patient Has Alerts: Yes cellular tissue based products placed in the center Patient Alerts: R ABI= N/C, TBI= .75 since last visit: L ABI =N/C, TBI = .57 Has Dressing in Place as Prescribed: Yes Has Compression in Place as Prescribed: Yes Pain Present Now: Yes Electronic Signature(s) Signed: 11/26/2022 2:54:46 PM By: Zenaida Deed RN, BSN Entered By: Zenaida Deed on 11/26/2022 12:35:58 -------------------------------------------------------------------------------- Compression Therapy Details Patient Name: Date of Service: [name], [name] 11/26/2022 12:30 PM Medical Record Number: [id number] Patient Account Number: [id number] Date of Birth/Sex: Treating RN: [birth year] (72 y.o. [name] Primary Care Philomena Buttermore: PA [name], [place] Other Clinician: Referring Verniece Encarnacion: Treating Devonte Migues/Extender: [name] in Treatment: 41 Compression Therapy Performed for Wound Assessment: Wound #3 Right,Medial Lower Leg Performed By: Clinician Zenaida Deed, RN Compression Type: Three Layer Post Procedure Diagnosis Same as Pre-procedure Electronic Signature(s) Signed: 11/26/2022 2:54:46 PM By: Zenaida Deed RN, BSN Entered By: Zenaida Deed on 11/26/2022 13:02:21 -------------------------------------------------------------------------------- Encounter Discharge Information Details Patient Name: Date of Service: [name], [name] 11/26/2022 12:30 PM Medical Record Number: [id number] Patient Account Number: [id number] Date of Birth/Sex: Treating RN: Apr 03, 1951 (72 y.o. [name] Primary Care Jahniah Pallas: PA [name], [place] Other Clinician: Referring Kyllie Pettijohn: Treating Kwabena Strutz/Extender: [name] in Treatment: 39 Encounter Discharge Information Items Post Procedure Vitals Discharge Condition: Stable Temperature (F): 97.7 Ambulatory Status: Wheelchair Pulse (bpm): 73 Discharge Destination: Home Respiratory Rate (breaths/min): 20 Transportation: Private Auto Blood Pressure (mmHg): 142/71 [name] ([id number]) [id number] Page 2 of 10 Accompanied By: self Schedule Follow-up Appointment: Yes Clinical Summary of Care: Patient Declined Electronic Signature(s) Signed: 11/26/2022 2:54:46 PM By: Zenaida Deed RN, BSN Entered By: Zenaida Deed on 11/26/2022 13:28:31 -------------------------------------------------------------------------------- Lower Extremity Assessment Details Patient Name: Date of Service: [name], [name] 11/26/2022 12:30 PM Medical Record Number: [id number] Patient Account Number: [id number] Date of Birth/Sex: Treating RN: 01-28-1951 (72 y.o. [name] Primary Care Suraya Vidrine: PA [name], [place] Other Clinician: Referring Rosalba Totty: Treating Sheron Tallman/Extender: [name] in  Treatment: 41 Edema Assessment Assessed: [Left: No] [Right: No] Edema: [Left: Ye] [Right: s] Calf Left: Right: Point of Measurement: From Medial Instep 40.5 cm Ankle Left: Right: Point of Measurement: From Medial Instep 26 cm Vascular Assessment Pulses: Dorsalis Pedis Palpable: [Right:No] Electronic Signature(s) Signed: 11/26/2022 2:54:46 PM By: Zenaida Deed RN, BSN Entered By: Zenaida Deed on 11/26/2022 12:41:23 -------------------------------------------------------------------------------- Multi Wound Chart Details Patient Name: Date of Service: [name] 11/26/2022 12:30 PM Medical Record Number: [id number] Patient Account Number: [id number] Date of Birth/Sex: Treating RN: May 19, 1951 (72 y.o. M) Primary Care Shani Fitch: PA [name], [place] Other Clinician: Referring Juel Ripley:  Treating Dondi Burandt/Extender: [name] Weeks in Treatment: 41 Vital Signs Height(in): 71 Pulse(bpm): 73 Weight(lbs): 267 Blood Pressure(mmHg): 142/71 Body Mass Index(BMI): 37.2 Temperature(F): 97.7 Respiratory Rate(breaths/min): 20 [3:Photos:] [N/A:N/A [id number] Page 3 of 10] Right, Medial Lower Leg Right, Anterior Lower Leg N/A Wound Location: Gradually Appeared Gradually Appeared N/A Wounding Event: Lymphedema Calcinosis N/A Primary Etiology: Cataracts, Anemia, Lymphedema, Cataracts, Anemia, Lymphedema, N/A Comorbid History: Congestive Heart Failure, Deep Vein Congestive Heart Failure, Deep Vein Thrombosis, Hypertension, Peripheral Thrombosis, Hypertension, Peripheral Venous Disease Venous Disease 12/07/2021 05/12/2022 N/A Date Acquired: 57 28 N/A Weeks of Treatment: Open Open N/A Wound Status: No No N/A Wound Recurrence: No Yes N/A Clustered Wound: N/A 2 N/A Clustered Quantity: 10.4x9.5x0.4 0.7x0.4x0.3 N/A Measurements L x W x D (cm) 77.597 0.22 N/A A (cm) : rea 31.039 0.066 N/A Volume (cm) : -1352.90% 6.80% N/A % Reduction  in A rea: -1353.10% -175.00% N/A % Reduction in Volume: Full Thickness With Exposed Support Full Thickness Without Exposed N/A Classification: Structures Support Structures Large Medium N/A Exudate A mount: Serosanguineous Purulent N/A Exudate Type: red, brown yellow, brown, green N/A Exudate Color: Distinct, outline attached Epibole N/A Wound Margin: Medium (34-66%) Medium (34-66%) N/A Granulation A mount: Red Red N/A Granulation Quality: Medium (34-66%) Small (1-33%) N/A Necrotic A mount: Fat Layer (Subcutaneous Tissue): Yes Fat Layer (Subcutaneous Tissue): Yes N/A Exposed Structures: Fascia: No Fascia: No Tendon: No Tendon: No Muscle: No Muscle: No Joint: No Joint: No Bone: No Bone: No Small (1-33%) Medium (34-66%) N/A Epithelialization: Debridement - Excisional N/A N/A Debridement: Pre-procedure Verification/Time Out 13:00 N/A N/A Taken: Lidocaine 4% Topical Solution N/A N/A Pain Control: Subcutaneous, Slough N/A N/A Tissue Debrided: Skin/Subcutaneous Tissue N/A N/A Level: 62.05 N/A N/A Debridement A (sq cm): rea Curette N/A N/A Instrument: Minimum N/A N/A Bleeding: Pressure N/A N/A Hemostasis A chieved: 3 N/A N/A Procedural Pain: 2 N/A N/A Post Procedural Pain: Procedure was tolerated well N/A N/A Debridement Treatment Response: 10.4x9.5x0.4 N/A N/A Post Debridement Measurements L x W x D (cm) 31.039 N/A N/A Post Debridement Volume: (cm) Excoriation: No Excoriation: No N/A Periwound Skin Texture: Induration: No Callus: No Crepitus: No Rash: No Scarring: No Maceration: No Maceration: No N/A Periwound Skin Moisture: Dry/Scaly: No Dry/Scaly: No Hemosiderin Staining: Yes Hemosiderin Staining: Yes N/A Periwound Skin Color: Erythema: No Atrophie Blanche: No Rubor: No Cyanosis: No Ecchymosis: No Erythema: No Mottled: No Pallor: No Rubor: No No Abnormality No Abnormality N/A Temperature: Yes Yes N/A Tenderness on  Palpation: Compression Therapy N/A N/A Procedures Performed: Debridement Treatment Notes Wound #3 (Lower Leg) Wound Laterality: Right, Medial Cleanser Peri-Wound Care Triamcinolone 15 (g) Discharge Instruction: Use triamcinolone 15 (g) as directed Zinc Oxide Ointment 30g tube Discharge Instruction: Apply Zinc Oxide to excoriated periwound with each dressing change as needed RITCHIE, PROIETTI ([id number]) [id number] Page 4 of 10 Sween Lotion (Moisturizing lotion) Discharge Instruction: Apply moisturizing lotion as directed Topical Gentamicin Discharge Instruction: mix 50:50 with mupirocin to wound bed Primary Dressing Cutimed Sorbact Swab Discharge Instruction: Apply to wound bed as instructed Secondary Dressing ABD Pad, 8x10 Discharge Instruction: Apply over primary dressing as directed. Zetuvit Plus 4x8 in Discharge Instruction: Apply over primary dressing as directed. Secured With Compression Wrap ThreePress (3 layer compression wrap) Discharge Instruction: Apply three layer compression as directed. Compression Stockings Add-Ons Wound #6 (Lower Leg) Wound Laterality: Right, Anterior Cleanser Peri-Wound Care Triamcinolone 15 (g) Discharge Instruction: Use triamcinolone 15 (g) as directed Zinc Oxide Ointment 30g tube Discharge Instruction: Apply Zinc Oxide to excoriated periwound with each dressing  change as needed Sween Lotion (Moisturizing lotion) Discharge Instruction: Apply moisturizing lotion as directed Topical Gentamicin Discharge Instruction: mix 50:50 with mupirocin to wound bed Primary Dressing Cutimed Sorbact Swab Discharge Instruction: Apply to wound bed as instructed Secondary Dressing ABD Pad, 8x10 Discharge Instruction: Apply over primary dressing as directed. Zetuvit Plus 4x8 in Discharge Instruction: Apply over primary dressing as directed. Secured With Compression Wrap ThreePress (3 layer compression wrap) Discharge  Instruction: Apply three layer compression as directed. Compression Stockings Add-Ons Electronic Signature(s) Signed: 11/26/2022 3:51:02 PM By: Duanne Guess MD FACS Entered By: Duanne Guess on 11/26/2022 15:51:02 Multi-Disciplinary Care Plan Details -------------------------------------------------------------------------------- [name] ([id number]) [id number] Page 5 of 10 Patient Name: Date of Service: [name], [name] 11/26/2022 12:30 PM Medical Record Number: [id number] Patient Account Number: [id number] Date of Birth/Sex: Treating RN: 12/16/1950 (72 y.o. [name] Primary Care Oshen Wlodarczyk: PA [name], [place] Other Clinician: Referring Henslee Lottman: Treating Manny Vitolo/Extender: [name] in Treatment: 25 Multidisciplinary Care Plan reviewed with physician Active Inactive Venous Leg Ulcer Nursing Diagnoses: Knowledge deficit related to disease process and management Potential for venous Insuffiency (use before diagnosis confirmed) Goals: Patient will maintain optimal edema control Date Initiated: 02/17/2022 Target Resolution Date: 12/22/2022 Goal Status: Active Interventions: Assess peripheral edema status every visit. Compression as ordered Provide education on venous insufficiency Treatment Activities: Non-invasive vascular studies : 02/17/2022 Therapeutic compression applied : 02/17/2022 Notes: Wound/Skin Impairment Nursing Diagnoses: Impaired tissue integrity Knowledge deficit related to ulceration/compromised skin integrity Goals: Patient/caregiver will verbalize understanding of skin care regimen Date Initiated: 02/17/2022 Target Resolution Date: 12/22/2022 Goal Status: Active Ulcer/skin breakdown will have a volume reduction of 30% by week 4 Date Initiated: 02/17/2022 Date Inactivated: 03/10/2022 Target Resolution Date: 03/10/2022 Unmet Reason: infection being treated, Goal Status: Unmet waiting on derm  appte Ulcer/skin breakdown will have a volume reduction of 50% by week 8 Date Initiated: 03/10/2022 Date Inactivated: 04/14/2022 Target Resolution Date: 04/07/2022 Goal Status: Unmet Unmet Reason: calcinosis Ulcer/skin breakdown will have a volume reduction of 80% by week 12 Date Initiated: 04/14/2022 Date Inactivated: 05/05/2022 Target Resolution Date: 05/05/2022 Unmet Reason: infection, chronic Goal Status: Unmet condition Interventions: Assess patient/caregiver ability to obtain necessary supplies Assess patient/caregiver ability to perform ulcer/skin care regimen upon admission and as needed Assess ulceration(s) every visit Provide education on ulcer and skin care Treatment Activities: Skin care regimen initiated : 02/17/2022 Topical wound management initiated : 02/17/2022 Notes: Electronic Signature(s) Signed: 11/26/2022 2:54:46 PM By: Zenaida Deed RN, BSN Entered By: Zenaida Deed on 11/26/2022 12:51:15 [name] ([id number]) [id number] Page 6 of 10 -------------------------------------------------------------------------------- Pain Assessment Details Patient Name: Date of Service: TAYARI, RAMOS 11/26/2022 12:30 PM Medical Record Number: [id number] Patient Account Number: [id number] Date of Birth/Sex: Treating RN: 14-Jul-1951 (72 y.o. [name] Primary Care Amadeus Oyama: PA [name], [place] Other Clinician: Referring Keerthi Hazell: Treating Jamilia Jacques/Extender: [name] in Treatment: 46 Active Problems Location of Pain Severity and Description of Pain Patient Has Paino Yes Site Locations Pain Location: Generalized Pain With Dressing Change: Yes Duration of the Pain. Constant / Intermittento Intermittent Rate the pain. Current Pain Level: 3 Worst Pain Level: 6 Least Pain Level: 0 Character of Pain Describe the Pain: Aching, Dull Pain Management and Medication Current Pain Management: Medication: Yes Is the Current  Pain Management Adequate: Adequate How does your wound impact your activities of daily livingo Sleep: No Bathing: No Appetite: No Relationship With Others: No Bladder Continence: No Emotions: No Bowel Continence: No Work: No Toileting: No Drive: No Dressing: No  Hobbies: No Notes reports general pain in right foot and leg Electronic Signature(s) Signed: 11/26/2022 2:54:46 PM By: Zenaida Deed RN, BSN Entered By: Zenaida Deed on 11/26/2022 12:37:26 -------------------------------------------------------------------------------- Patient/Caregiver Education Details Patient Name: Date of Service: GIRISH, [name] 5/3/2024andnbsp12:30 PM Medical Record Number: [id number] Patient Account Number: [id number] Date of Birth/Gender: Treating RN: 10/18/1950 (72 y.o. [name] Primary Care Physician: PA [name], [place] Other Clinician: Referring Physician: Treating Physician/Extender: [name] in Treatment: 84 Education Assessment Education Provided To: Patient Education Topics Provided AMIR, VONBERGEN ([id number]) [id number] Page 7 of 10 Infection: Methods: Explain/Verbal Responses: Reinforcements needed, State content correctly Venous: Methods: Explain/Verbal Responses: Reinforcements needed, State content correctly Electronic Signature(s) Signed: 11/26/2022 2:54:46 PM By: Zenaida Deed RN, BSN Entered By: Zenaida Deed on 11/26/2022 12:51:52 -------------------------------------------------------------------------------- Wound Assessment Details Patient Name: Date of Service: CHESTON, ROSENQUIST 11/26/2022 12:30 PM Medical Record Number: [id number] Patient Account Number: [id number] Date of Birth/Sex: Treating RN: Nov 10, 1950 (72 y.o. [name] Primary Care Luane Rochon: PA [name], [place] Other Clinician: Referring Aletheia Tangredi: Treating Kathyann Spaugh/Extender: [name] in Treatment: 41 Wound Status Wound  Number: 3 Primary Lymphedema Etiology: Wound Location: Right, Medial Lower Leg Wound Open Wounding Event: Gradually Appeared Status: Date Acquired: 12/07/2021 Comorbid Cataracts, Anemia, Lymphedema, Congestive Heart Failure, Deep Weeks Of Treatment: 41 History: Vein Thrombosis, Hypertension, Peripheral Venous Disease Clustered Wound: No Photos Wound Measurements Length: (cm) 10.4 Width: (cm) 9.5 Depth: (cm) 0.4 Area: (cm) 77.597 Volume: (cm) 31.039 % Reduction in Area: -1352.9% % Reduction in Volume: -1353.1% Epithelialization: Small (1-33%) Tunneling: No Undermining: No Wound Description Classification: Full Thickness With Exposed Suppo Wound Margin: Distinct, outline attached Exudate Amount: Large Exudate Type: Serosanguineous Exudate Color: red, brown rt Structures Foul Odor After Cleansing: No Slough/Fibrino Yes Wound Bed Granulation Amount: Medium (34-66%) Exposed Structure Granulation Quality: Red Fascia Exposed: No Necrotic Amount: Medium (34-66%) Fat Layer (Subcutaneous Tissue) Exposed: Yes Necrotic Quality: Adherent Slough Tendon Exposed: No Muscle Exposed: No Joint Exposed: No Bone Exposed: No [address] ELCHANAN, DALESANDRO ([id number]) [id number] Page 8 of 10 No Abnormalities Noted: Yes No Abnormalities Noted: No Erythema: No Moisture Hemosiderin Staining: Yes No Abnormalities Noted: No Rubor: No Dry / Scaly: No Maceration: No Temperature / Pain Temperature: No Abnormality Tenderness on Palpation: Yes Treatment Notes Wound #3 (Lower Leg) Wound Laterality: Right, Medial Cleanser Peri-Wound Care Triamcinolone 15 (g) Discharge Instruction: Use triamcinolone 15 (g) as directed Zinc Oxide Ointment 30g tube Discharge Instruction: Apply Zinc Oxide to excoriated periwound with each dressing change as needed Sween Lotion (Moisturizing lotion) Discharge Instruction: Apply moisturizing lotion as  directed Topical Gentamicin Discharge Instruction: mix 50:50 with mupirocin to wound bed Primary Dressing Cutimed Sorbact Swab Discharge Instruction: Apply to wound bed as instructed Secondary Dressing ABD Pad, 8x10 Discharge Instruction: Apply over primary dressing as directed. Zetuvit Plus 4x8 in Discharge Instruction: Apply over primary dressing as directed. Secured With Compression Wrap ThreePress (3 layer compression wrap) Discharge Instruction: Apply three layer compression as directed. Compression Stockings Add-Ons Electronic Signature(s) Signed: 11/26/2022 2:54:46 PM By: Zenaida Deed RN, BSN Entered By: Zenaida Deed on 11/26/2022 12:53:59 -------------------------------------------------------------------------------- Wound Assessment Details Patient Name: Date of Service: [name], [name] 11/26/2022 12:30 PM Medical Record Number: [id number] Patient Account Number: [id number] Date of Birth/Sex: Treating RN: [birth year] (72 y.o. [name] Primary Care Jamorian Dimaria: PA [name], [place] Other Clinician: Referring Kelli Robeck: Treating Jaycen Vercher/Extender: [name] in Treatment: 41 Wound Status Wound Number: 6 Primary Calcinosis Etiology: Wound Location: Right, Anterior  Lower Leg Wound Open Wounding Event: Gradually Appeared Status: Date Acquired: 05/12/2022 Comorbid Cataracts, Anemia, Lymphedema, Congestive Heart Failure, Deep Weeks Of Treatment: 28 History: Vein Thrombosis, Hypertension, Peripheral Venous Disease Clustered Wound: Yes Photos [name], [name] ([id number]) [id number] Page 9 of 10 Wound Measurements Length: (cm) Width: (cm) Depth: (cm) Clustered Quantity: Area: (cm) Volume: (cm) 0.7 % Reduction in Area: 6.8% 0.4 % Reduction in Volume: -175% 0.3 Epithelialization: Medium (34-66%) 2 Tunneling: No 0.22 Undermining: No 0.066 Wound Description Classification: Full Thickness Without Exposed Sup Wound  Margin: Epibole Exudate Amount: Medium Exudate Type: Purulent Exudate Color: yellow, brown, green port Structures Foul Odor After Cleansing: No Slough/Fibrino Yes Wound Bed Granulation Amount: Medium (34-66%) Exposed Structure Granulation Quality: Red Fascia Exposed: No Necrotic Amount: Small (1-33%) Fat Layer (Subcutaneous Tissue) Exposed: Yes Necrotic Quality: Adherent Slough Tendon Exposed: No Muscle Exposed: No Joint Exposed: No Bone Exposed: No Periwound Skin Texture Texture Color No Abnormalities Noted: Yes No Abnormalities Noted: No Atrophie Blanche: No Moisture Cyanosis: No No Abnormalities Noted: Yes Ecchymosis: No Erythema: No Hemosiderin Staining: Yes Mottled: No Pallor: No Rubor: No Temperature / Pain Temperature: No Abnormality Tenderness on Palpation: Yes Treatment Notes Wound #6 (Lower Leg) Wound Laterality: Right, Anterior Cleanser Peri-Wound Care Triamcinolone 15 (g) Discharge Instruction: Use triamcinolone 15 (g) as directed Zinc Oxide Ointment 30g tube Discharge Instruction: Apply Zinc Oxide to excoriated periwound with each dressing change as needed Sween Lotion (Moisturizing lotion) Discharge Instruction: Apply moisturizing lotion as directed Topical Gentamicin Discharge Instruction: mix 50:50 with mupirocin to wound bed Primary Dressing Cutimed Sorbact Swab Discharge Instruction: Apply to wound bed as instructed [name], [name] ([id number]) [id number] Page 10 of 10 Secondary Dressing ABD Pad, 8x10 Discharge Instruction: Apply over primary dressing as directed. Zetuvit Plus 4x8 in Discharge Instruction: Apply over primary dressing as directed. Secured With Compression Wrap ThreePress (3 layer compression wrap) Discharge Instruction: Apply three layer compression as directed. Compression Stockings Add-Ons Electronic Signature(s) Signed: 11/26/2022 2:54:46 PM By: Zenaida Deed RN, BSN Entered By: Zenaida Deed  on 11/26/2022 12:54:35 -------------------------------------------------------------------------------- Vitals Details Patient Name: Date of Service: [name], [name] 11/26/2022 12:30 PM Medical Record Number: [id number] Patient Account Number: [id number] Date of Birth/Sex: Treating RN: Nov 01, 1950 (71 y.o. [name] Primary Care Nikiesha Milford: PA [name], [place] Other Clinician: Referring Sheriece Jefcoat: Treating Ashlynd Michna/Extender: [name] in Treatment: 41 Vital Signs Time Taken: 12:36 Temperature (F): 97.7 Height (in): 71 Pulse (bpm): 73 Weight (lbs): 267 Respiratory Rate (breaths/min): 20 Body Mass Index (BMI): 37.2 Blood Pressure (mmHg): 142/71 Reference Range: 80 - 120 mg / dl Electronic Signature(s) Signed: 11/26/2022 2:54:46 PM By: Zenaida Deed RN, BSN Entered By: Zenaida Deed on 11/26/2022 12:36:17

## 2022-11-30 ENCOUNTER — Other Ambulatory Visit: Payer: Self-pay

## 2022-11-30 ENCOUNTER — Encounter (HOSPITAL_BASED_OUTPATIENT_CLINIC_OR_DEPARTMENT_OTHER): Payer: Medicare Other | Admitting: General Surgery

## 2022-11-30 DIAGNOSIS — L97812 Non-pressure chronic ulcer of other part of right lower leg with fat layer exposed: Secondary | ICD-10-CM | POA: Diagnosis not present

## 2022-11-30 DIAGNOSIS — I110 Hypertensive heart disease with heart failure: Secondary | ICD-10-CM | POA: Diagnosis not present

## 2022-11-30 DIAGNOSIS — I509 Heart failure, unspecified: Secondary | ICD-10-CM | POA: Diagnosis not present

## 2022-11-30 DIAGNOSIS — L97522 Non-pressure chronic ulcer of other part of left foot with fat layer exposed: Secondary | ICD-10-CM | POA: Diagnosis not present

## 2022-11-30 DIAGNOSIS — L942 Calcinosis cutis: Secondary | ICD-10-CM | POA: Diagnosis not present

## 2022-11-30 DIAGNOSIS — I872 Venous insufficiency (chronic) (peripheral): Secondary | ICD-10-CM | POA: Diagnosis not present

## 2022-12-01 NOTE — Progress Notes (Signed)
RUPERT, BAUMHARDT ([id number]) [id number] Page 1 of 4 Visit Report for 11/30/2022 Arrival Information Details Patient Name: Date of Service: [name], [name] 11/30/2022 11:15 A M Medical Record Number: [id number] Patient Account Number: [id number] Date of Birth/Sex: Treating RN: October 28, 1950 (72 y.o. [name] Primary Care Elbert Polyakov: PA [name], [place] Other Clinician: Referring Allee Busk: Treating Caliope Ruppert/Extender: [name] in Treatment: 42 Visit Information History Since Last Visit Added or deleted any medications: No Patient Arrived: Wheel Chair Any new allergies or adverse reactions: No Arrival Time: 14:47 Had a fall or experienced change in No Accompanied By: self activities of daily living that may affect Transfer Assistance: None risk of falls: Patient Identification Verified: Yes Signs or symptoms of abuse/neglect since last visito No Secondary Verification Process Completed: Yes Hospitalized since last visit: No Patient Requires Transmission-Based Precautions: No Implantable device outside of the clinic excluding No Patient Has Alerts: Yes cellular tissue based products placed in the center Patient Alerts: R ABI= N/C, TBI= .75 since last visit: L ABI =N/C, TBI = .57 Has Dressing in Place as Prescribed: Yes Has Compression in Place as Prescribed: Yes Pain Present Now: No Electronic Signature(s) Signed: 12/01/2022 4:26:39 PM By: Zenaida Deed RN, BSN Entered By: Zenaida Deed on 11/30/2022 14:47:39 -------------------------------------------------------------------------------- Compression Therapy Details Patient Name: Date of Service: [name] 11/30/2022 11:15 A M Medical Record Number: [id number] Patient Account Number: [id number] Date of Birth/Sex: Treating RN: 06-Dec-1950 (72 y.o. [name] Primary Care Jailee Jaquez: PA [name], [place] Other Clinician: Referring Eleora Sutherland: Treating Yenifer Saccente/Extender: [name] in Treatment: 42 Compression Therapy Performed for Wound Assessment: Wound #3 Right,Medial Lower Leg Performed By: Clinician Zenaida Deed, RN Compression Type: Four Layer Electronic Signature(s) Signed: 12/01/2022 4:26:39 PM By: Zenaida Deed RN, BSN Entered By: Zenaida Deed on 11/30/2022 16:11:56 -------------------------------------------------------------------------------- Encounter Discharge Information Details Patient Name: Date of Service: [name] 11/30/2022 11:15 A M Medical Record Number: [id number] Patient Account Number: [id number] Date of Birth/Sex: Treating RN: 1951/03/28 (73 y.o. [name] Primary Care Halley Kincer: PA [name], [place] Other Clinician: Referring Kameren Baade: Treating Jamarkis Branam/Extender: [name] in Treatment: 42 Encounter Discharge Information Items Discharge Condition: Stable Ambulatory Status: Wheelchair Discharge Destination: Home Transportation: Private Auto Accompanied By: self Schedule Follow-up Appointment: Yes Clinical Summary of Care: Patient [name], [name] ([id number]) [id number] Page 2 of 4 Electronic Signature(s) Signed: 12/01/2022 4:26:39 PM By: Zenaida Deed RN, BSN Entered By: Zenaida Deed on 11/30/2022 16:13:17 -------------------------------------------------------------------------------- Patient/Caregiver Education Details Patient Name: Date of Service: [name], [name] 5/7/2024andnbsp11:15 A M Medical Record Number: [id number] Patient Account Number: [id number] Date of Birth/Gender: Treating RN: 1950-12-28 (72 y.o. [name] Primary Care Physician: PA [name], [place] Other Clinician: Referring Physician: Treating Physician/Extender: [name] in Treatment: 13 Education Assessment Education Provided To: Patient Education Topics Provided Malignant/Atypical Wounds: Methods: Explain/Verbal Responses:  Reinforcements needed, State content correctly Venous: Methods: Explain/Verbal Responses: Reinforcements needed, State content correctly Electronic Signature(s) Signed: 12/01/2022 4:26:39 PM By: Zenaida Deed RN, BSN Entered By: Zenaida Deed on 11/30/2022 16:12:57 -------------------------------------------------------------------------------- Wound Assessment Details Patient Name: Date of Service: [name], [name] 11/30/2022 11:15 A M Medical Record Number: [id number] Patient Account Number: [id number] Date of Birth/Sex: Treating RN: [birth year] (72 y.o. [name] Primary Care Kadden Osterhout: PA [name], [place] Other Clinician: Referring Khy Pitre: Treating Nastashia Gallo/Extender: [name] in Treatment: 42 Wound Status Wound Number: 3 Primary Etiology: Lymphedema Wound Location: Right, Medial Lower Leg Wound Status: Open Wounding Event: Gradually Appeared Date  Acquired: 12/07/2021 Weeks Of Treatment: 42 Clustered Wound: No Wound Measurements Length: (cm) 10.4 Width: (cm) 9.5 Depth: (cm) 0.4 Area: (cm) 77.597 Volume: (cm) 31.039 % Reduction in Area: -1352.9% % Reduction in Volume: -1353.1% Wound Description Classification: Full Thickness With Exposed Support S Exudate Amount: Large Exudate Type: Serosanguineous Exudate Color: red, brown tructures [address] KLINTON, ALDRIDGE ([id number]) [id number] Page 3 of 4 Texture Color No Abnormalities Noted: No No Abnormalities Noted: No Moisture No Abnormalities Noted: No Treatment Notes Wound #3 (Lower Leg) Wound Laterality: Right, Medial Cleanser Peri-Wound Care Zinc Oxide Ointment 30g tube Discharge Instruction: Apply Zinc Oxide to excoriated periwound with each dressing change as needed Sween Lotion (Moisturizing lotion) Discharge Instruction: Apply moisturizing lotion as directed Topical Gentamicin Discharge Instruction: mix 50:50 with mupirocin to wound bed Primary  Dressing Cutimed Sorbact Swab Discharge Instruction: Apply to wound bed as instructed Secondary Dressing Zetuvit Plus 4x8 in Discharge Instruction: Apply over primary dressing as directed. Secured With Compression Wrap ThreePress (3 layer compression wrap) Discharge Instruction: Apply three layer compression as directed. Compression Stockings Add-Ons Electronic Signature(s) Signed: 12/01/2022 4:26:39 PM By: Zenaida Deed RN, BSN Entered By: Zenaida Deed on 11/30/2022 14:48:02 -------------------------------------------------------------------------------- Wound Assessment Details Patient Name: Date of Service: [name], [name] 11/30/2022 11:15 A M Medical Record Number: [id number] Patient Account Number: [id number] Date of Birth/Sex: Treating RN: March 28, 1951 (72 y.o. [name] Primary Care Jaston Havens: PA [name], [place] Other Clinician: Referring Scarlett Portlock: Treating Shakoya Gilmore/Extender: [name] in Treatment: 42 Wound Status Wound Number: 6 Primary Etiology: Calcinosis Wound Location: Right, Anterior Lower Leg Wound Status: Open Wounding Event: Gradually Appeared Date Acquired: 05/12/2022 Weeks Of Treatment: 28 Clustered Wound: Yes Wound Measurements Length: (cm) 0.7 Width: (cm) 0.4 Depth: (cm) 0.3 Area: (cm) 0.22 Volume: (cm) 0.066 % Reduction in Area: 6.8% % Reduction in Volume: -175% Wound Description Classification: Full Thickness Without Exposed Support Structures Exudate Amount: Medium Exudate Type: Purulent Zink, Jd ([id number]) Exudate Color: yellow, brown, green [id number] Page 4 of 4 Periwound Skin Texture Texture Color No Abnormalities Noted: No No Abnormalities Noted: No Moisture No Abnormalities Noted: No Treatment Notes Wound #6 (Lower Leg) Wound Laterality: Right, Anterior Cleanser Peri-Wound Care Triamcinolone 15 (g) Discharge Instruction: Use triamcinolone 15 (g) as directed Zinc Oxide  Ointment 30g tube Discharge Instruction: Apply Zinc Oxide to excoriated periwound with each dressing change as needed Sween Lotion (Moisturizing lotion) Discharge Instruction: Apply moisturizing lotion as directed Topical Gentamicin Discharge Instruction: mix 50:50 with mupirocin to wound bed Primary Dressing Cutimed Sorbact Swab Discharge Instruction: Apply to wound bed as instructed Secondary Dressing ABD Pad, 8x10 Discharge Instruction: Apply over primary dressing as directed. Zetuvit Plus 4x8 in Discharge Instruction: Apply over primary dressing as directed. Secured With Compression Wrap ThreePress (3 layer compression wrap) Discharge Instruction: Apply three layer compression as directed. Compression Stockings Add-Ons Electronic Signature(s) Signed: 12/01/2022 4:26:39 PM By: Zenaida Deed RN, BSN Entered By: Zenaida Deed on 11/30/2022 14:48:02

## 2022-12-01 NOTE — Progress Notes (Signed)
GREER, GABEHART ([id number]) [id number] Page 1 of 1 Visit Report for 11/30/2022 SuperBill Details Patient Name: Date of Service: [name], [name] 11/30/2022 Medical Record Number: [id number] Patient Account Number: [id number] Date of Birth/Sex: Treating RN: 04-Sep-1950 (72 y.o. 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

## 2022-12-03 ENCOUNTER — Encounter (HOSPITAL_BASED_OUTPATIENT_CLINIC_OR_DEPARTMENT_OTHER): Payer: Medicare Other | Admitting: General Surgery

## 2022-12-03 DIAGNOSIS — I872 Venous insufficiency (chronic) (peripheral): Secondary | ICD-10-CM | POA: Diagnosis not present

## 2022-12-03 DIAGNOSIS — I890 Lymphedema, not elsewhere classified: Secondary | ICD-10-CM | POA: Diagnosis not present

## 2022-12-03 DIAGNOSIS — I110 Hypertensive heart disease with heart failure: Secondary | ICD-10-CM | POA: Diagnosis not present

## 2022-12-03 DIAGNOSIS — I509 Heart failure, unspecified: Secondary | ICD-10-CM | POA: Diagnosis not present

## 2022-12-03 DIAGNOSIS — L97812 Non-pressure chronic ulcer of other part of right lower leg with fat layer exposed: Secondary | ICD-10-CM | POA: Diagnosis not present

## 2022-12-03 DIAGNOSIS — L97522 Non-pressure chronic ulcer of other part of left foot with fat layer exposed: Secondary | ICD-10-CM | POA: Diagnosis not present

## 2022-12-03 DIAGNOSIS — L942 Calcinosis cutis: Secondary | ICD-10-CM | POA: Diagnosis not present

## 2022-12-04 NOTE — Progress Notes (Signed)
[name], [name] ([id number]) [id number] Page 1 of 9 Visit Report for 12/03/2022 Arrival Information Details Patient Name: Date of Service: [name], [name] 12/03/2022 12:30 PM Medical Record Number: [id number] Patient Account Number: [id number] Date of Birth/Sex: Treating RN: 06-23-1951 (72 y.o. [name] Primary Care Bridney Guadarrama: PA [name], [place] Other Clinician: Referring Chaise Passarella: Treating Ricki Vanhandel/Extender: [name] in Treatment: 42 Visit Information History Since Last Visit Added or deleted any medications: No Patient Arrived: Wheel Chair Any new allergies or adverse reactions: No Arrival Time: 12:51 Had a fall or experienced change in No Accompanied By: self activities of daily living that may affect Transfer Assistance: None risk of falls: Patient Identification Verified: Yes Signs or symptoms of abuse/neglect since last visito No Secondary Verification Process Completed: Yes Hospitalized since last visit: No Patient Requires Transmission-Based Precautions: No Implantable device outside of the clinic excluding No Patient Has Alerts: Yes cellular tissue based products placed in the center Patient Alerts: R ABI= N/C, TBI= .75 since last visit: L ABI =N/C, TBI = .57 Has Dressing in Place as Prescribed: Yes Has Compression in Place as Prescribed: Yes Pain Present Now: No Electronic Signature(s) Signed: 12/03/2022 4:28:56 PM By: Zenaida Deed RN, BSN Entered By: Zenaida Deed on 12/03/2022 12:51:43 -------------------------------------------------------------------------------- Compression Therapy Details Patient Name: Date of Service: [name], [name] 12/03/2022 12:30 PM Medical Record Number: [id number] Patient Account Number: [id number] Date of Birth/Sex: Treating RN: [birth year] (72 y.o. [name] Primary Care Tory Septer: PA [name], [place] Other Clinician: Referring Hazelynn Mckenny: Treating Enis Leatherwood/Extender: [name] in Treatment: 42 Compression Therapy Performed for Wound Assessment: Wound #3 Right,Medial Lower Leg Performed By: Clinician Zenaida Deed, RN Compression Type: Three Layer Post Procedure Diagnosis Same as Pre-procedure Electronic Signature(s) Signed: 12/03/2022 4:28:56 PM By: Zenaida Deed RN, BSN Entered By: Zenaida Deed on 12/03/2022 13:26:56 -------------------------------------------------------------------------------- Encounter Discharge Information Details Patient Name: Date of Service: [name], [name] 12/03/2022 12:30 PM Medical Record Number: [id number] Patient Account Number: [id number] Date of Birth/Sex: Treating RN: [birth year] (72 y.o. [name] Primary Care Geriann Lafont: PA [name], [place] Other Clinician: Referring Jeilani Grupe: Treating Dijuan Sleeth/Extender: [name] in Treatment: 42 Encounter Discharge Information Items Post Procedure Vitals Discharge Condition: Stable Temperature (F): 97.8 Ambulatory Status: Wheelchair Pulse (bpm): 55 Discharge Destination: Home Respiratory Rate (breaths/min): 18 Transportation: Private Auto Blood Pressure (mmHg): 126/67 TOMMYE, DUBAY ([id number]) [id number] Page 2 of 9 Accompanied By: self Schedule Follow-up Appointment: Yes Clinical Summary of Care: Patient Declined Electronic Signature(s) Signed: 12/03/2022 4:28:56 PM By: Zenaida Deed RN, BSN Entered By: Zenaida Deed on 12/03/2022 14:44:21 -------------------------------------------------------------------------------- Lower Extremity Assessment Details Patient Name: Date of Service: [name], [name] 12/03/2022 12:30 PM Medical Record Number: [id number] Patient Account Number: [id number] Date of Birth/Sex: Treating RN: 1951-03-21 (72 y.o. [name] Primary Care Rozlynn Lippold: PA [name], [place] Other Clinician: Referring Karmah Potocki: Treating Story Conti/Extender: [name] Weeks  in Treatment: 42 Edema Assessment Assessed: [Left: No] [Right: No] Edema: [Left: Ye] [Right: s] Calf Left: Right: Point of Measurement: From Medial Instep 43.2 cm Ankle Left: Right: Point of Measurement: From Medial Instep 26 cm Vascular Assessment Pulses: Dorsalis Pedis Palpable: [Right:No] Electronic Signature(s) Signed: 12/03/2022 4:28:56 PM By: Zenaida Deed RN, BSN Entered By: Zenaida Deed on 12/03/2022 12:56:28 -------------------------------------------------------------------------------- Multi Wound Chart Details Patient Name: Date of Service: [name], [name] 12/03/2022 12:30 PM Medical Record Number: [id number] Patient Account Number: [id number] Date of Birth/Sex: Treating RN: 1950/10/21 (72 y.o. M) Primary Care Elzena Muston: PA [name], [place] Other Clinician: Referring Arneta Mahmood:  Treating Key Cen/Extender: [name] Weeks in Treatment: 42 Vital Signs Height(in): 71 Pulse(bpm): 55 Weight(lbs): 267 Blood Pressure(mmHg): 126/67 Body Mass Index(BMI): 37.2 Temperature(F): 97.8 Respiratory Rate(breaths/min): 20 [3:Photos:] [N/A:N/A [id number] Page 3 of 9] Right, Medial Lower Leg Right, Anterior Lower Leg N/A Wound Location: Gradually Appeared Gradually Appeared N/A Wounding Event: Lymphedema Calcinosis N/A Primary Etiology: Cataracts, Anemia, Lymphedema, Cataracts, Anemia, Lymphedema, N/A Comorbid History: Congestive Heart Failure, Deep Vein Congestive Heart Failure, Deep Vein Thrombosis, Hypertension, Peripheral Thrombosis, Hypertension, Peripheral Venous Disease Venous Disease 12/07/2021 05/12/2022 N/A Date Acquired: 51 29 N/A Weeks of Treatment: Open Open N/A Wound Status: No No N/A Wound Recurrence: No Yes N/A Clustered Wound: N/A 2 N/A Clustered Quantity: 5.5x9x0.2 2.2x0.5x0.5 N/A Measurements L x W x D (cm) 38.877 0.864 N/A A (cm) : rea 7.775 0.432 N/A Volume (cm) : -627.90% -266.10% N/A %  Reduction in A rea: -264.00% -1700.00% N/A % Reduction in Volume: Full Thickness With Exposed Support Full Thickness Without Exposed N/A Classification: Structures Support Structures Medium Medium N/A Exudate A mount: Serosanguineous Serosanguineous N/A Exudate Type: red, brown red, brown N/A Exudate Color: Indistinct, nonvisible Epibole N/A Wound Margin: Large (67-100%) Large (67-100%) N/A Granulation A mount: Red Red N/A Granulation Quality: Small (1-33%) Small (1-33%) N/A Necrotic A mount: Fat Layer (Subcutaneous Tissue): Yes Fat Layer (Subcutaneous Tissue): Yes N/A Exposed Structures: Fascia: No Fascia: No Tendon: No Tendon: No Muscle: No Muscle: No Joint: No Joint: No Bone: No Bone: No Small (1-33%) Small (1-33%) N/A Epithelialization: Debridement - Selective/Open Wound Debridement - Excisional N/A Debridement: Pre-procedure Verification/Time Out 13:20 13:20 N/A Taken: Lidocaine 4% Topical Solution Lidocaine 4% Topical Solution N/A Pain Control: Slough Subcutaneous, Slough N/A Tissue Debrided: Non-Viable Tissue Skin/Subcutaneous Tissue N/A Level: 36.91 0.43 N/A Debridement A (sq cm): rea Curette Curette N/A Instrument: Minimum Minimum N/A Bleeding: Pressure Pressure N/A Hemostasis A chieved: 4 4 N/A Procedural Pain: 3 3 N/A Post Procedural Pain: Procedure was tolerated well Procedure was tolerated well N/A Debridement Treatment Response: 5.5x9.5x0.2 2.2x0.5x0.5 N/A Post Debridement Measurements L x W x D (cm) 8.207 0.432 N/A Post Debridement Volume: (cm) No Abnormalities Noted No Abnormalities Noted N/A Periwound Skin Texture: No Abnormalities Noted No Abnormalities Noted N/A Periwound Skin Moisture: Hemosiderin Staining: Yes Hemosiderin Staining: Yes N/A Periwound Skin Color: N/A probes to calcium deposits N/A Assessment Notes: Compression Therapy Debridement N/A Procedures Performed: Debridement Treatment Notes Electronic  Signature(s) Signed: 12/03/2022 1:30:23 PM By: Duanne Guess MD FACS Entered By: Duanne Guess on 12/03/2022 13:30:22 -------------------------------------------------------------------------------- Multi-Disciplinary Care Plan Details Patient Name: Date of Service: [name], [name] 12/03/2022 12:30 PM Medical Record Number: [id number] Patient Account Number: [id number] Date of Birth/Sex: Treating RN: [birth year] (72 y.o. [name] Primary Care Adira Limburg: PA [name], [place] Other Clinician: Referring Daje Stark: Treating Haward Pope/Extender: [name] in Treatment: 42 Multidisciplinary Care Plan reviewed with physician ARCADIO, CRUTE ([id number]) [id number] Page 4 of 9 Active Inactive Venous Leg Ulcer Nursing Diagnoses: Knowledge deficit related to disease process and management Potential for venous Insuffiency (use before diagnosis confirmed) Goals: Patient will maintain optimal edema control Date Initiated: 02/17/2022 Target Resolution Date: 12/22/2022 Goal Status: Active Interventions: Assess peripheral edema status every visit. Compression as ordered Provide education on venous insufficiency Treatment Activities: Non-invasive vascular studies : 02/17/2022 Therapeutic compression applied : 02/17/2022 Notes: Wound/Skin Impairment Nursing Diagnoses: Impaired tissue integrity Knowledge deficit related to ulceration/compromised skin integrity Goals: Patient/caregiver will verbalize understanding of skin care regimen Date Initiated: 02/17/2022 Target Resolution Date: 12/22/2022 Goal Status: Active Ulcer/skin breakdown will have  a volume reduction of 30% by week 4 Date Initiated: 02/17/2022 Date Inactivated: 03/10/2022 Target Resolution Date: 03/10/2022 Unmet Reason: infection being treated, Goal Status: Unmet waiting on derm appte Ulcer/skin breakdown will have a volume reduction of 50% by week 8 Date Initiated: 03/10/2022 Date  Inactivated: 04/14/2022 Target Resolution Date: 04/07/2022 Goal Status: Unmet Unmet Reason: calcinosis Ulcer/skin breakdown will have a volume reduction of 80% by week 12 Date Initiated: 04/14/2022 Date Inactivated: 05/05/2022 Target Resolution Date: 05/05/2022 Unmet Reason: infection, chronic Goal Status: Unmet condition Interventions: Assess patient/caregiver ability to obtain necessary supplies Assess patient/caregiver ability to perform ulcer/skin care regimen upon admission and as needed Assess ulceration(s) every visit Provide education on ulcer and skin care Treatment Activities: Skin care regimen initiated : 02/17/2022 Topical wound management initiated : 02/17/2022 Notes: Electronic Signature(s) Signed: 12/03/2022 4:28:56 PM By: Zenaida Deed RN, BSN Entered By: Zenaida Deed on 12/03/2022 14:42:31 -------------------------------------------------------------------------------- Pain Assessment Details Patient Name: Date of Service: [name], [name] 12/03/2022 12:30 PM Medical Record Number: [id number] Patient Account Number: [id number] Date of Birth/Sex: Treating RN: May 31, 1951 (72 y.o. [name] Primary Care Vanity Larsson: PA [name], [place] Other Clinician: Referring Yoali Conry: Treating Narely Nobles/Extender: [name] in Treatment: [address] TYRUS, SINGLETON ([id number]) [id number] Page 5 of 9 Location of Pain Severity and Description of Pain Patient Has Paino Yes Site Locations Pain Location: Generalized Pain With Dressing Change: No Duration of the Pain. Constant / Intermittento Intermittent Rate the pain. Current Pain Level: 2 Worst Pain Level: 4 Least Pain Level: 0 Character of Pain Describe the Pain: Aching Pain Management and Medication Current Pain Management: Medication: Yes Is the Current Pain Management Adequate: Adequate How does your wound impact your activities of daily livingo Sleep:  No Bathing: No Appetite: No Relationship With Others: No Bladder Continence: No Emotions: No Bowel Continence: No Work: No Toileting: No Drive: No Dressing: No Hobbies: No Psychologist, prison and probation services) Signed: 12/03/2022 4:28:56 PM By: Zenaida Deed RN, BSN Entered By: Zenaida Deed on 12/03/2022 12:52:45 -------------------------------------------------------------------------------- Patient/Caregiver Education Details Patient Name: Date of Service: [name] 5/10/2024andnbsp12:30 PM Medical Record Number: [id number] Patient Account Number: [id number] Date of Birth/Gender: Treating RN: Oct 24, 1950 (72 y.o. [name] Primary Care Physician: PA [name], [place] Other Clinician: Referring Physician: Treating Physician/Extender: [name] in Treatment: 53 Education Assessment Education Provided To: Patient Education Topics Provided Venous: Methods: Explain/Verbal Responses: Reinforcements needed, State content correctly Wound/Skin Impairment: Methods: Explain/Verbal Responses: Reinforcements needed, State content correctly Electronic Signature(s) Signed: 12/03/2022 4:28:56 PM By: Zenaida Deed RN, BSN Entered By: Zenaida Deed on 12/03/2022 14:43:00 [name] Latino ([id number]) [id number] Page 6 of 9 -------------------------------------------------------------------------------- Wound Assessment Details Patient Name: Date of Service: [name], [name] 12/03/2022 12:30 PM Medical Record Number: [id number] Patient Account Number: [id number] Date of Birth/Sex: Treating RN: 09-15-1950 (72 y.o. [name] Primary Care Berneice Zettlemoyer: PA [name], [place] Other Clinician: Referring Lanique Gonzalo: Treating Yoshiye Kraft/Extender: [name] in Treatment: 42 Wound Status Wound Number: 3 Primary Lymphedema Etiology: Wound Location: Right, Medial Lower Leg Wound Open Wounding Event: Gradually Appeared Status: Date  Acquired: 12/07/2021 Comorbid Cataracts, Anemia, Lymphedema, Congestive Heart Failure, Deep Weeks Of Treatment: 42 History: Vein Thrombosis, Hypertension, Peripheral Venous Disease Clustered Wound: No Photos Wound Measurements Length: (cm) 5.5 Width: (cm) 9 Depth: (cm) 0.2 Area: (cm) 38.877 Volume: (cm) 7.775 % Reduction in Area: -627.9% % Reduction in Volume: -264% Epithelialization: Small (1-33%) Tunneling: No Undermining: No Wound Description Classification: Full Thickness With Exposed Suppo Wound Margin: Indistinct, nonvisible Exudate Amount:  Medium Exudate Type: Serosanguineous Exudate Color: red, brown rt Structures Foul Odor After Cleansing: No Slough/Fibrino Yes Wound Bed Granulation Amount: Large (67-100%) Exposed Structure Granulation Quality: Red Fascia Exposed: No Necrotic Amount: Small (1-33%) Fat Layer (Subcutaneous Tissue) Exposed: Yes Necrotic Quality: Adherent Slough Tendon Exposed: No Muscle Exposed: No Joint Exposed: No Bone Exposed: No Periwound Skin Texture Texture Color No Abnormalities Noted: Yes No Abnormalities Noted: No Hemosiderin Staining: Yes Moisture No Abnormalities Noted: Yes Treatment Notes Wound #3 (Lower Leg) Wound Laterality: Right, Medial Cleanser Peri-Wound Care Zinc Oxide Ointment 30g tube Discharge Instruction: Apply Zinc Oxide to excoriated periwound with each dressing change as needed DARRLYN, TROSCLAIR ([id number]) [id number] Page 7 of 9 Sween Lotion (Moisturizing lotion) Discharge Instruction: Apply moisturizing lotion as directed Topical Gentamicin Discharge Instruction: mix 50:50 with mupirocin to wound bed Primary Dressing Cutimed Sorbact Swab Discharge Instruction: Apply to wound bed as instructed Secondary Dressing ABD Pad, 8x10 Discharge Instruction: Apply over primary dressing as directed. Zetuvit Plus 4x8 in Discharge Instruction: Apply over primary dressing as directed. Secured  With Compression Wrap ThreePress (3 layer compression wrap) Discharge Instruction: Apply three layer compression as directed. Compression Stockings Add-Ons Electronic Signature(s) Signed: 12/03/2022 4:28:56 PM By: Zenaida Deed RN, BSN Entered By: Zenaida Deed on 12/03/2022 13:05:48 -------------------------------------------------------------------------------- Wound Assessment Details Patient Name: Date of Service: [name], [name] 12/03/2022 12:30 PM Medical Record Number: [id number] Patient Account Number: [id number] Date of Birth/Sex: Treating RN: [birth year] (72 y.o. [name] Primary Care Peaches Vanoverbeke: PA [name], [place] Other Clinician: Referring Laia Wiley: Treating Anaeli Cornwall/Extender: [name] in Treatment: 42 Wound Status Wound Number: 6 Primary Calcinosis Etiology: Wound Location: Right, Anterior Lower Leg Wound Open Wounding Event: Gradually Appeared Status: Date Acquired: 05/12/2022 Comorbid Cataracts, Anemia, Lymphedema, Congestive Heart Failure, Deep Weeks Of Treatment: 29 History: Vein Thrombosis, Hypertension, Peripheral Venous Disease Clustered Wound: Yes Photos Wound Measurements Length: (cm) Width: (cm) Depth: (cm) Clustered Quantity: Area: (cm) Volume: (cm) 2.2 % Reduction in Area: -266.1% 0.5 % Reduction in Volume: -1700% 0.5 Epithelialization: Small (1-33%) 2 Tunneling: No 0.864 Undermining: No 0.432 Wound Description Lacks, Quanta ([id number]) Classification: Full Thickness Without Exposed Support Structures Wound Margin: Epibole Exudate Amount: Medium Exudate Type: Serosanguineous Exudate Color: red, brown [phone].pdf Page 8 of 9 Foul Odor After Cleansing: No Slough/Fibrino Yes Wound Bed Granulation Amount: Large (67-100%) Exposed Structure Granulation Quality: Red Fascia Exposed: No Necrotic Amount: Small (1-33%) Fat Layer (Subcutaneous Tissue) Exposed: Yes Necrotic Quality:  Adherent Slough Tendon Exposed: No Muscle Exposed: No Joint Exposed: No Bone Exposed: No Periwound Skin Texture Texture Color No Abnormalities Noted: Yes No Abnormalities Noted: No Hemosiderin Staining: Yes Moisture No Abnormalities Noted: Yes Assessment Notes probes to calcium deposits Treatment Notes Wound #6 (Lower Leg) Wound Laterality: Right, Anterior Cleanser Peri-Wound Care Zinc Oxide Ointment 30g tube Discharge Instruction: Apply Zinc Oxide to excoriated periwound with each dressing change as needed Sween Lotion (Moisturizing lotion) Discharge Instruction: Apply moisturizing lotion as directed Topical Gentamicin Discharge Instruction: mix 50:50 with mupirocin to wound bed Primary Dressing Maxorb Extra Ag+ Alginate Dressing, 2x2 (in/in) Discharge Instruction: Apply to wound bed as instructed Secondary Dressing ABD Pad, 8x10 Discharge Instruction: Apply over primary dressing as directed. Zetuvit Plus 4x8 in Discharge Instruction: Apply over primary dressing as directed. Secured With Compression Wrap ThreePress (3 layer compression wrap) Discharge Instruction: Apply three layer compression as directed. Compression Stockings Add-Ons Electronic Signature(s) Signed: 12/03/2022 4:28:56 PM By: Zenaida Deed RN, BSN Entered By: Zenaida Deed on 12/03/2022 13:07:11 -------------------------------------------------------------------------------- Vitals Details Patient Name: Date  of Service: [name], [place] 12/03/2022 12:30 PM Medical Record Number: [id number] Patient Account Number: [id number] Date of Birth/Sex: Treating RN: [birth year] (72 y.o. [name] Primary Care Javante Nilsson: PA [name], [place] Other Clinician: Referring Tsugio Elison: Treating Sally-Anne Wamble/Extender: [name] [place], [name] ([id number]) [id number] Page 9 of 9 Weeks in Treatment: 42 Vital Signs Time Taken: 12:50 Temperature (F): 97.8 Height (in): 71 Pulse  (bpm): 55 Weight (lbs): 267 Respiratory Rate (breaths/min): 20 Body Mass Index (BMI): 37.2 Blood Pressure (mmHg): 126/67 Reference Range: 80 - 120 mg / dl Electronic Signature(s) Signed: 12/03/2022 4:28:56 PM By: Zenaida Deed RN, BSN Entered By: Zenaida Deed on 12/03/2022 12:52:15

## 2022-12-04 NOTE — Progress Notes (Signed)
JETLI, LAUBSCHER ([id number]) [id number] Page 1 of 15 Visit Report for 12/03/2022 Chief Complaint Document Details Patient Name: Date of Service: [name], [name] 12/03/2022 12:30 PM Medical Record Number: [id number] Patient Account Number: [id number] Date of Birth/Sex: Treating RN: 02-10-1951 (72 y.o. M) Primary Care Provider: PA [name], NO Other Clinician: Referring Provider: Treating Provider/Extender: [name] in Treatment: 42 Information Obtained from: Patient Chief Complaint Patient seen for complaints of Non-Healing Wounds. Electronic Signature(s) Signed: 12/03/2022 1:30:31 PM By: Duanne Guess MD FACS Entered By: Duanne Guess on 12/03/2022 13:30:30 -------------------------------------------------------------------------------- Debridement Details Patient Name: Date of Service: [name], [name] 12/03/2022 12:30 PM Medical Record Number: [id number] Patient Account Number: [id number] Date of Birth/Sex: Treating RN: October 09, 1950 (72 y.o. [name] Primary Care Provider: PA [name], [place] Other Clinician: Referring Provider: Treating Provider/Extender: [name] in Treatment: 42 Debridement Performed for Assessment: Wound #3 Right,Medial Lower Leg Performed By: Physician Duanne Guess, MD Debridement Type: Debridement Level of Consciousness (Pre-procedure): Awake and Alert Pre-procedure Verification/Time Out Yes - 13:20 Taken: Start Time: 13:22 Pain Control: Lidocaine 4% T opical Solution Percent of Wound Bed Debrided: 90% T Area Debrided (cm): otal 36.91 Tissue and other material debrided: Non-Viable, Slough, Slough Level: Non-Viable Tissue Debridement Description: Selective/Open Wound Instrument: Curette Bleeding: Minimum Hemostasis Achieved: Pressure Procedural Pain: 4 Post Procedural Pain: 3 Response to Treatment: Procedure was tolerated well Level of Consciousness (Post- Awake and  Alert procedure): Post Debridement Measurements of Total Wound Length: (cm) 5.5 Width: (cm) 9.5 Depth: (cm) 0.2 Volume: (cm) 8.207 Character of Wound/Ulcer Post Debridement: Improved Post Procedure Diagnosis Same as Pre-procedure [name], RN Electronic Signature(s) Signed: 12/03/2022 4:20:07 PM By: Duanne Guess MD FACS Signed: 12/03/2022 4:28:56 PM By: Zenaida Deed RN, BSN [place], [name] ([id number]) [id number] Page 2 of 15 Entered By: Zenaida Deed on 12/03/2022 13:25:47 -------------------------------------------------------------------------------- Debridement Details Patient Name: Date of Service: [name], [name] 12/03/2022 12:30 PM Medical Record Number: [id number] Patient Account Number: [id number] Date of Birth/Sex: Treating RN: 1951-03-25 (72 y.o. [name] Primary Care Provider: PA [name], [place] Other Clinician: Referring Provider: Treating Provider/Extender: [name] in Treatment: 42 Debridement Performed for Assessment: Wound #6 Right,Anterior Lower Leg Performed By: Physician Duanne Guess, MD Debridement Type: Debridement Level of Consciousness (Pre-procedure): Awake and Alert Pre-procedure Verification/Time Out Yes - 13:20 Taken: Start Time: 13:22 Pain Control: Lidocaine 4% T opical Solution Percent of Wound Bed Debrided: 50% T Area Debrided (cm): otal 0.43 Tissue and other material debrided: Viable, Non-Viable, Slough, Subcutaneous, Slough Level: Skin/Subcutaneous Tissue Debridement Description: Excisional Instrument: Curette Bleeding: Minimum Hemostasis Achieved: Pressure Procedural Pain: 4 Post Procedural Pain: 3 Response to Treatment: Procedure was tolerated well Level of Consciousness (Post- Awake and Alert procedure): Post Debridement Measurements of Total Wound Length: (cm) 2.2 Width: (cm) 0.5 Depth: (cm) 0.5 Volume: (cm) 0.432 Character  of Wound/Ulcer Post Debridement: Improved Post Procedure Diagnosis Same as Pre-procedure [name], RN Electronic Signature(s) Signed: 12/03/2022 4:20:07 PM By: Duanne Guess MD FACS Signed: 12/03/2022 4:28:56 PM By: Zenaida Deed RN, BSN Entered By: Zenaida Deed on 12/03/2022 13:26:36 -------------------------------------------------------------------------------- HPI Details Patient Name: Date of Service: [name], [name] 12/03/2022 12:30 PM Medical Record Number: [id number] Patient Account Number: [id number] Date of Birth/Sex: Treating RN: 14-Jun-1951 (72 y.o. M) Primary Care Provider: PA [name], NO Other Clinician: Referring Provider: Treating Provider/Extender: Luvenia Redden, Edwena Felty in Treatment: 42 History of Present Illness HPI Description: ADMISSION 02/09/2022 This  is a 72 year old man who recently moved to [place] from [place]. He has a history of of coronary artery disease and prior left popliteal vein DVT . He is not diabetic and does not smoke. 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 He has had multiple spinal surgeries and has limited mobility. He primarily uses a motorized wheelchair. He has had lymphedema for many years and does have lymphedema pumps although he says he does not use them frequently. He has wounds on his bilateral lower extremities. He was receiving care in [place] for these prior to his move. They were apparently packing his wounds with Betadine soaked gauze. He has worn compression wraps in the past but not recently. He did say that they helped tremendously. In clinic today, his right ABI is noncompressible but has good signals; the left ABI was 1.17. No formal vascular studies have been performed. [name], [name] ([id number]) [id number] Page 3 of 15 On his left dorsal foot, there is a circular lesion that exposes the fat layer. There is fairly heavy slough  accumulation within the wound, but no significant odor. On his right medial calf, he has multiple pinholes that have slough buildup in them and probe for about 2 to 4 mm in depth. They are draining clear fluid. On his right lateral midfoot, he has ulceration consistent with venous stasis. It is geographic and has slough accumulation. 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 02/17/2022: All of the wounds are little bit larger today and have more slough accumulation. Today, the medial calf openings are larger and probe to something that feels calcified. There is odor coming from his wounds. His vascular studies are scheduled for August 9 and 10. 02-24-2022 upon evaluation today patient presents for follow-up concerning his ongoing issues here with his lower extremities. With that being said he does have significant problems with what appears to be cellulitis unfortunately the PCR culture that was sent last week got crushed by [employer] in transit. With that being said we are going to reobtain a culture today although I am actually to do it on the right medial leg as this seems to be the most inflamed area today where there is some questionable drainage as well. I am going to remove some of the calcium deposits which are loosening obtain a deeper culture from this area. Fortunately I do not see any evidence of active infection systemically which is good news but locally I think we are having a bigger issue here. He does have his appointment next Thursday with vascular. Patient has also been referred to [facility] for further evaluation and treatment of the calcinosis for possible sulfate injections. 03/04/2022: The culture that was performed last week grew out multiple species including Klebsiella, Morganella, and Pseudomonas aeruginosa. He had been prescribed Bactrim but this was not adequate to cover all of the species  and levofloxacin was recommended. He completed the Bactrim but did not pick up the levofloxacin and begin taking it until yesterday. 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 He had arterial studies performed today. He does have evidence of peripheral arterial disease. Results copied here: +-------+-----------+-----------+------------+------------+ ABI/TBIT oday's ABIT oday's TBIPrevious ABIPrevious TBI +-------+-----------+-----------+------------+------------+ Right [place] 0.75    +-------+-----------+-----------+------------+------------+ Left [place] 0.57    +-------+-----------+-----------+------------+------------+ Arterial  wall calcification precludes accurate ankle pressures and ABIs. Summary: Right: Resting right ankle-brachial index indicates noncompressible right lower extremity arteries. The right toe-brachial index is normal. Left: Resting left ankle-brachial index indicates noncompressible left lower extremity arteries. The left toe-brachial index is abnormal. He continues to have further tissue breakdown at the right medial calf and there is ample slough accumulation along with necrotic subcutaneous tissue. The left dorsal foot wound has built up slough, as well. The right medial foot wound is nearly closed with just a light layer of eschar over the surface. The right dorsal lateral foot wound has also accumulated a fair amount of slough and remains quite tender. 03/10/2022: He has been taking the prescribed levofloxacin and has about 3 days left of this. The erythema and induration on his right leg has improved. He continues to experience further breakdown of the right medial calf wound. There is extensive slough and debris accumulation there. There is heavy slough on his left dorsal foot wound, but no  odor or purulent drainage. The right medial foot wound appears to be closed. The right dorsal lateral foot wound also has a fair amount of slough but it is less tender than at our last visit. He still does not have an appointment with dermatology. 03/22/2022: The right anterior tibial wound has closed. The right lateral foot wound is nearly closed with just a little bit of slough. The left dorsal foot wound is smaller and continues to have some slough buildup but less so than at prior visits. There is good granulation tissue underlying the slough. Unfortunately the right medial calf wound continues to deteriorate. It has a foul odor today and a lot of necrotic tissue, the surrounding skin is also erythematous and moist. 03/30/2022: The right lateral foot wound continues to contract and just has a little bit of slough and eschar present. The left dorsal foot wound is also smaller with slough overlying good granulation tissue. The right medial calf wound actually looks quite a bit better today; there is no odor and there is much less necrotic tissue although some remains present. We have been using topical gentamicin and he has been taking oral Augmentin. 04/07/2022: The patient saw dermatology at [facility] last week. Unfortunately, it appears that they did not read any of the documentation that was provided. 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 Overall, it was a highly unsatisfactory consultation. In the meantime, however, we were able to identify compounding pharmacy who could create a topical sodium thiosulfate ointment. The patient has that with him today. Both of the right foot wounds are now closed. The left dorsal foot wound  has contracted but still has a layer of slough on the surface. The medial calf wounds on the right all look a little bit better. They still have heavy slough along with the chunks of calcium. Everything is stained purple. 04/14/2022: The wound on his dorsal left foot is a little bit smaller again today. There is still slough accumulation on the surface. The medial calf wounds have quite a bit less slough accumulation today. His right leg, however, is warm and erythematous, concerning for cellulitis. 04/14/2022: He completed his course of Augmentin yesterday. The medial calf wound sites are much cleaner today and shallower. There  is less fragmented calcium in the wounds. He has a lot of lymphedema fluid-like drainage from these wounds, but no purulent discharge or malodor. The wound on his dorsal left foot has also contracted and is shallower. There is a layer of slough over healthy granulation tissue. 05/05/2022: The left dorsal foot wound is smaller today with less slough and more granulation tissue. The right medial calf wounds are shallower, but have extensive slough and some fat necrosis. The drainage has diminished considerably, however. 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 05/12/2022: The left dorsal foot wound continues to contract and fill with granulation tissue. There is slough on the wound surface. The right medial calf wounds also continue to fill with healthy tissue, but there is remaining slough and fat necrosis present. He had a significant increase in his drainage this week and it was a bright yellow-green color. He also had a new wound open over his right anterior tibial surface associated with the spike of cutaneous calcium. The leg is more red, as well, concerning for infection. 05/19/2022: His culture returned positive for Pseudomonas. He is currently taking a  course of oral levofloxacin. We have also been using topical gentamicin mixed in with his sodium thiosulfate. All of the wounds look better this week. 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 There is slough and nonviable subcutaneous tissue still present. The new anterior tibial wound just has a light layer of slough. The dorsal foot wound also has some slough present and appears a little dry today. 05/26/2022: The right medial leg wound continues to improve. It is feeling with granulation tissue, but still accumulates a fairly thick layer of slough on the surface. The more recent anterior tibial wound has remained quite small, but also has some slough on the surface. The left dorsal foot wound has contracted KRAIG, BONDI ([id number]) [id number] Page 4 of 15 somewhat and has perimeter epithelialization. He completed his oral levofloxacin. 06/02/2022: The left dorsal foot wound continues to improve significantly. There is just a little slough on the wound surface. The right medial leg wound had black drainage, but it looks like silver alginate was applied last week and I theorized that silver sulfide was created with a combination of the silver alginate and the sodium thiosulfate. It did, however, have a bit of an odor to it and extensive slough accumulation. The small anterior tibial wound also had a bit of slough and nonviable subcutaneous tissue present. The skin of his leg was rather macerated, as well. 06/09/2022: The left dorsal foot wound is smaller again this week with just a light layer of slough on the surface. The right medial leg wound looks substantially better. The leg and periwound skin are no longer red and macerated. There is good granulation tissue forming with less slough and nonviable tissue. The anterior tibial wound just has a small amount of slough accumulation. 06/16/2022: The left dorsal foot wound continues to  contract. His wrap slipped a little bit, however, and his foot is more swollen. The right medial leg wound continues to improve. The underlying tissue is more vital-appearing and there is less slough. He does have substantial drainage. The small anterior tibial wound has a bit of slough accumulation. 06/23/2022: The left dorsal foot wound is about half the size as it was last week. There is just some light slough on the surface  and some eschar buildup around the edges. The right anterior leg wound is small with a little slough on the surface. Unfortunately, the right medial leg wound deteriorated fairly substantially. It is malodorous with gray/green/black discoloration. 06/30/2022: The left dorsal foot wound continues to contract. It is nearly closed with just a light layer of slough present. The right anterior leg wound is about the same size and has a small amount of slough on the surface. The right medial leg wound looks a lot better than it did last week. The odor has abated and the tissue is less pulpy and necrotic-looking. There is still a fairly thick layer of slough present. 12/13; the left dorsal foot wound looks very healthy. There was no need to change the dressing here and no debridement. He has a small area on the right anterior lower leg apparently had not changed much in size. The area on the medial leg is the most problematic area this is large with some depth and a completely nonviable surface today. 07/14/2022: The left dorsal foot wound is smaller today with just a little slough and eschar present. The right anterior lower leg wound is about the same without any significant change. The medial leg wound has a black and green surface and is malodorous. No purulent drainage and the periwound skin is intact. Edema control is suboptimal. 07/21/2022: The dorsal foot wound continues to contract and is nearly closed with just a little bit of slough and eschar on the surface. The right  anterior lower leg wound is shallower today but otherwise the dimensions are unchanged. The medial leg wound looks significantly better although it still has a nonviable surface; the odor and black/green surface has resolved. His culture came back with multiple species sensitive to Augmentin, which was prescribed and a very low level of Pseudomonas, which should be handled by the topical gentamicin we have been using. 07/28/2022: The dorsal foot wound is down to just a very tiny opening with a little eschar on the surface. The right anterior tibial wound is about the same with some slough buildup. The right medial leg wound looks quite a bit better this week. There is thick rubbery slough on the surface, but the odor and drainage has improved significantly. 08/04/2022: The dorsal foot wound is almost healed, but the periwound skin has broken down. This appears to be secondary to moisture and adhesive from the absorbent pad. The right anterior tibial wound is shallower. The right medial leg wound continues to improve. It is smaller, still with rubbery slough on the surface. No malodor or purulent drainage. 08/11/2022: The dorsal foot wound is about the same size. It is a little bit dry with some slough accumulation. The right anterior tibial wound is also unchanged. The right medial leg wound is filling in further with good granulation tissue. Still with some slough buildup on the surface. 08/18/2022: His left leg is bright red, hot, and swollen with cellulitis. The dorsal foot wound actually looks quite good and is superficial with just a little bit of slough accumulation. The right anterior tibial wound is unchanged. The right medial leg wound has filled in with good granulation tissue but has copious amounts of drainage. 08/25/2022: His cellulitis has responded nicely to the oral antibiotics. His dorsal foot wound is smaller but has a fair amount of slough buildup. The right anterior tibial wound remains  stable and unchanged. The right medial leg wound has more granulation tissue under a layer of slough, but continues to  have copious drainage. The drainage is actually causing skin irritation and threatens to result in breakdown. 09/01/2022: He continues to have profuse drainage from his wounds which is resulting in tissue maceration on both the dorsum of his left foot and the periwound distal to his medial leg wound. The dorsal foot wound is nearly healed, despite this. The medial right leg wound is filling in with good granulation tissue. We are working on getting home health assistance to change his dressings more frequently to try and control the drainage better. 09/08/2022: We have had success in controlling his drainage. The tissue maceration has improved significantly and his swelling has come down quite markedly. The medial right leg wound is much more superficial and has substantially less nonviable tissue present. The anterior tibial wound is a little bit larger, however with some nonviable tissue present. 09/15/2022: The dorsal foot wound is nearly closed. The anterior tibial wound measures slightly larger today. The medial right leg wound continues to improve quite dramatically with less nonviable tissue and more robust-looking granulation. Edema control is significantly better. 09/22/2022: The left dorsal foot wound remains open, albeit quite tiny. The anterior tibial wound is a little bit larger again today. The medial right leg wound continues to improve with an increase in robust and healthy granulation tissue and less accumulation of nonviable tissue. 09/29/2022: His home health providers wrapped his left leg extremely tightly and he ended up having to cut the wrap off of his foot. This resulted in his foot being very swollen and that the dorsal foot wound is a little bit larger today. The anterior tibial wound is about the same size with a little slough on the surface. The medial right leg  wound continues to fill in. There are very few areas with any significant depth. There is still slough accumulation. 3/13; Patient has a left dorsal foot wound along with a right anterior tibial wound and right medial wound. We are using silver alginate to the left dorsal foot wound, Santyl and sodium thiosulfate compound ointment to the right lower extremity wounds under 4 layer compression. Patient has no issues or complaints today. 10/13/2022: The left dorsal foot wound is a little bit bigger today, but his foot is also a little bit more swollen. The right medial leg wound has filled in more and has substantially less depth. There is still some nonviable tissue present. The small anterior lower leg wound looks about the same. 10/20/2022: The left dorsal foot wound is epithelializing. There does not appear to be much open at this site. There is some eschar around the edges. The right medial leg wound continues to fill in with granulation tissue. The small anterior lower leg wound is a little bit bigger and there is a chunk of calcium protruding. He continues to have fairly copious drainage from the right leg. 10/27/2022: His home health agency failed to show up at all this past week and as a result, the excessive drainage from his right leg wound has resulted in tissue breakdown. His leg is red and irritated. The wounds themselves look about the same. The wound on his left dorsal foot is nearly closed underneath some eschar. 11/03/2022: We continue to have difficulty with his home health agency. He continues to have significant drainage from his right leg wound which has resulted in even further tissue breakdown. He also was not taking his Lasix as prescribed and I think much of the drainage has been his lymphedema fluid choosing the path of least  resistance. The wound on his left dorsal foot is closed, but in the process of cleaning the site, dry skin was pulled off and he has a new small superficial  wound just adjacent to where the dorsal foot wound was. AUGGIE, KOWALKE ([id number]) [id number] Page 5 of 15 11/12/2022: His left dorsal foot is completely healed. The right medial leg continues to deteriorate. He is not taking his Lasix as prescribed, nor is he using his lymphedema pumps at all. As a result, he has massive amounts of drainage saturating his dressings and causing further tissue breakdown on his medial leg. Today, his dressings had a foul odor, as well. 11/19/2022: His right medial leg looks even worse today. There is more erythema and moisture related tissue breakdown. The 2 anterior wounds look a little bit better. The culture that I took last week did produce Pseudomonas and he is currently taking levofloxacin for this. 11/26/2022: There has been significant improvement to his right medial leg. It is far less raw and many of the superficial open areas have epithelialized. He continues to cut the foot portion of his wrap and as result his foot is bulbous and swollen compared to the rest of his leg, where edema control is good. The smaller of the 2 anterior tibial wound is closed, while the larger is contracting somewhat with a bit of slough at the base. 12/03/2022: His leg continues to demonstrate improvement. There has been epithelialization of much of the denuded area. He has a new small anterior tibial wound that has opened around a nidus of calcium. Edema control is improved. Electronic Signature(s) Signed: 12/03/2022 1:31:26 PM By: Duanne Guess MD FACS Entered By: Duanne Guess on 12/03/2022 13:31:26 -------------------------------------------------------------------------------- Physical Exam Details Patient Name: Date of Service: HARLIS, CAFFEE 12/03/2022 12:30 PM Medical Record Number: [id number] Patient Account Number: [id number] Date of Birth/Sex: Treating RN: 1950/10/15 (72 y.o. M) Primary Care Provider: PA [name], NO Other Clinician: Referring  Provider: Treating Provider/Extender: Luvenia Redden, Edwena Felty in Treatment: 42 Constitutional . Slightly bradycardic. . . no acute distress. Respiratory Normal work of breathing on room air. Notes 12/03/2022: His leg continues to demonstrate improvement. There has been epithelialization of much of the denuded area. He has a new small anterior tibial wound that has opened around a nidus of calcium. Edema control is improved. Electronic Signature(s) Signed: 12/03/2022 1:49:47 PM By: Duanne Guess MD FACS Entered By: Duanne Guess on 12/03/2022 13:49:47 -------------------------------------------------------------------------------- Physician Orders Details Patient Name: Date of Service: AITHAN, BACCAM 12/03/2022 12:30 PM Medical Record Number: [id number] Patient Account Number: [id number] Date of Birth/Sex: Treating RN: 1950-09-15 (72 y.o. 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 - Dr. [name] Rm 1 Return A Friday 5/17 @ 12:30 pm [name] ([id number]) [id number] Page 6 of 15 Nurse Visit: - Tuesday 5/14 @ 11:15 am Anesthetic Wound #3 Right,Medial Lower Leg (In clinic) Topical Lidocaine 4% applied to wound bed Wound #6 Right,Anterior Lower Leg (In clinic) Topical Lidocaine 4% applied to wound  bed Bathing/ Shower/  Hygiene May shower with protection but do not get wound dressing(s) wet. Protect dressing(s) with water repellant cover (for example, large plastic bag) or a cast cover and may then take shower. Edema Control - Lymphedema / SCD / Other Lymphedema Pumps. Use Lymphedema pumps on leg(s) 2-3 times a day for 45-60 minutes. If wearing any wraps or hose, do not remove them. Continue exercising as instructed. - use 1-2 times per day over wraps Elevate legs to the level of the heart or above for 30 minutes daily and/or when sitting for 3-4 times a day throughout the day. - throughout the day Avoid standing for long periods of time. Exercise regularly Wound Treatment Wound #3 - Lower Leg Wound Laterality: Right, Medial Peri-Wound Care: Zinc Oxide Ointment 30g tube 2 x Per Week/30 Days Discharge Instructions: Apply Zinc Oxide to excoriated periwound with each dressing change as needed Peri-Wound Care: Sween Lotion (Moisturizing lotion) 2 x Per Week/30 Days Discharge Instructions: Apply moisturizing lotion as directed Topical: Gentamicin 2 x Per Week/30 Days Discharge Instructions: mix 50:50 with mupirocin to wound bed Prim Dressing: Cutimed Sorbact Swab 2 x Per Week/30 Days ary Discharge Instructions: Apply to wound bed as instructed Secondary Dressing: ABD Pad, 8x10 (Generic) 2 x Per Week/30 Days Discharge Instructions: Apply over primary dressing as directed. Secondary Dressing: Zetuvit Plus 4x8 in 2 x Per Week/30 Days Discharge Instructions: Apply over primary dressing as directed. Compression Wrap: ThreePress (3 layer compression wrap) 2 x Per Week/30 Days Discharge Instructions: Apply three layer compression as directed. Wound #6 - Lower Leg Wound Laterality: Right, Anterior Peri-Wound Care: Zinc Oxide Ointment 30g tube 2 x Per Week/30 Days Discharge Instructions: Apply Zinc Oxide to excoriated periwound with each dressing change as needed Peri-Wound Care: Sween Lotion  (Moisturizing lotion) 2 x Per Week/30 Days Discharge Instructions: Apply moisturizing lotion as directed Topical: Gentamicin 2 x Per Week/30 Days Discharge Instructions: mix 50:50 with mupirocin to wound bed Prim Dressing: Maxorb Extra Ag+ Alginate Dressing, 2x2 (in/in) 2 x Per Week/30 Days ary Discharge Instructions: Apply to wound bed as instructed Secondary Dressing: ABD Pad, 8x10 (Generic) 2 x Per Week/30 Days Discharge Instructions: Apply over primary dressing as directed. Secondary Dressing: Zetuvit Plus 4x8 in 2 x Per Week/30 Days Discharge Instructions: Apply over primary dressing as directed. Compression Wrap: ThreePress (3 layer compression wrap) 2 x Per Week/30 Days Discharge Instructions: Apply three layer compression as directed. Electronic Signature(s) Signed: 12/03/2022 4:20:07 PM By: Duanne Guess MD FACS Entered By: Duanne Guess on 12/03/2022 13:51:23 Problem List Details -------------------------------------------------------------------------------- [name] ([id number]) [id number] Page 7 of 15 Patient Name: Date of Service: LOTTIE, LINGREN 12/03/2022 12:30 PM Medical Record Number: [id number] Patient Account Number: [id number] Date of Birth/Sex: Treating RN: 1951-05-31 (72 y.o. 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) 02/09/2022 No Yes I10 Essential (primary) hypertension 02/09/2022 No Yes I25.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 angina pectoris 02/09/2022 No Yes I89.0 Lymphedema, not elsewhere classified 02/09/2022  No Yes Z86.718 Personal history of other venous thrombosis and embolism 02/09/2022 No Yes Inactive Problems ICD-10 Code Description Active Date Inactive Date L97.512 Non-pressure chronic ulcer of other part of right foot with fat layer exposed 02/17/2022 02/17/2022  Resolved Problems ICD-10 Code Description Active Date Resolved Date L97.522 Non-pressure chronic ulcer of other part of left foot with fat layer exposed 02/09/2022 02/09/2022 Electronic Signature(s) Signed: 12/03/2022 1:30:16 PM By: Duanne Guess MD FACS Entered By: Duanne Guess on 12/03/2022 13:30:15 -------------------------------------------------------------------------------- Progress Note Details Patient Name: Date of Service: TYNE, FINERTY [name] 12/03/2022 12:30 PM Medical Record Number: [id number] Patient Account Number: [id number] Date of Birth/Sex: Treating RN: Jan 21, 1951 (72 y.o. M) Primary Care Provider: PA Fidela Juneau Other Clinician: Referring Provider: Treating Provider/Extender: [name] in Treatment: [address], [place] ([id number]) [id number] Page 8 of 15 Subjective Chief Complaint Information obtained from Patient Patient seen for complaints of Non-Healing Wounds. History of Present Illness (HPI) ADMISSION 02/09/2022 This is a 72 year old man who recently moved to [place] from [place]. He has a history of of coronary artery disease and prior left popliteal vein DVT . He is not diabetic and does not smoke. 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 He has had multiple spinal surgeries and has limited mobility. He primarily uses a motorized wheelchair. He has had lymphedema for many years and does have lymphedema pumps although he says he does not use them frequently. He has wounds on his bilateral lower extremities. He was receiving care in [place] for these prior to his move. They were apparently packing his wounds with Betadine soaked  gauze. He has worn compression wraps in the past but not recently. He did say that they helped tremendously. In clinic today, his right ABI is noncompressible but has good signals; the left ABI was 1.17. No formal vascular studies have been performed. On his left dorsal foot, there is a circular lesion that exposes the fat layer. There is fairly heavy slough accumulation within the wound, but no significant odor. On his right medial calf, he has multiple pinholes that have slough buildup in them and probe for about 2 to 4 mm in depth. They are draining clear fluid. On his right lateral midfoot, he has ulceration consistent with venous stasis. It is geographic and has slough accumulation. 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 02/17/2022: All of the wounds are little bit larger today and have more slough accumulation. Today, the medial calf openings are larger and probe to something that feels calcified. There is odor coming from his wounds. His vascular studies are scheduled for August 9 and 10. 02-24-2022 upon evaluation today patient presents for follow-up concerning his ongoing issues here with his lower extremities. With that being said he does have significant problems with what appears to be cellulitis unfortunately the PCR culture that was sent last week got crushed by [employer] in transit. With that being said we are going to reobtain a culture today although I am actually to do it on the right medial leg as this seems to be the most inflamed area today where there is some questionable drainage as well. I am going to remove some of the calcium deposits which are loosening obtain a deeper culture from this area. Fortunately I do not see any evidence of active infection systemically which is good news but locally I think we are having a bigger issue here. He does have his appointment next Thursday with vascular.  Patient has also been referred to [facility] for further evaluation and treatment of the calcinosis for possible sulfate injections. 03/04/2022: The culture that was performed last week grew out multiple  species including Klebsiella, Morganella, and Pseudomonas aeruginosa. He had been prescribed Bactrim but this was not adequate to cover all of the species and levofloxacin was recommended. He completed the Bactrim but did not pick up the levofloxacin and begin taking it until yesterday. 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 He had arterial studies performed today. He does have evidence of peripheral arterial disease. Results copied here: +-------+-----------+-----------+------------+------------+ ABI/TBIT oday's ABIT oday's TBIPrevious ABIPrevious TBI +-------+-----------+-----------+------------+------------+ Right [place] 0.75    +-------+-----------+-----------+------------+------------+ Left [place] 0.57    +-------+-----------+-----------+------------+------------+ Arterial wall calcification precludes accurate ankle pressures and ABIs. Summary: Right: Resting right ankle-brachial index indicates noncompressible right lower extremity arteries. The right toe-brachial index is normal. Left: Resting left ankle-brachial index indicates noncompressible left lower extremity arteries. The left toe-brachial index is abnormal. He continues to have further tissue breakdown at the right medial calf and there is ample slough accumulation along with necrotic subcutaneous tissue. The left dorsal foot wound has built up slough, as well. The right medial foot wound is nearly closed with just a light layer of eschar over the surface. The right dorsal lateral foot wound has also accumulated a fair amount of slough and remains  quite tender. 03/10/2022: He has been taking the prescribed levofloxacin and has about 3 days left of this. The erythema and induration on his right leg has improved. He continues to experience further breakdown of the right medial calf wound. There is extensive slough and debris accumulation there. There is heavy slough on his left dorsal foot wound, but no odor or purulent drainage. The right medial foot wound appears to be closed. The right dorsal lateral foot wound also has a fair amount of slough but it is less tender than at our last visit. He still does not have an appointment with dermatology. 03/22/2022: The right anterior tibial wound has closed. The right lateral foot wound is nearly closed with just a little bit of slough. The left dorsal foot wound is smaller and continues to have some slough buildup but less so than at prior visits. There is good granulation tissue underlying the slough. Unfortunately the right medial calf wound continues to deteriorate. It has a foul odor today and a lot of necrotic tissue, the surrounding skin is also erythematous and moist. 03/30/2022: The right lateral foot wound continues to contract and just has a little bit of slough and eschar present. The left dorsal foot wound is also smaller with slough overlying good granulation tissue. The right medial calf wound actually looks quite a bit better today; there is no odor and there is much less necrotic tissue although some remains present. We have been using topical gentamicin and he has been taking oral Augmentin. 04/07/2022: The patient saw dermatology at [facility] last week. Unfortunately, it appears that they did not read any of the documentation that was provided. 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 Overall, it was a highly unsatisfactory consultation. In the meantime, however, we were able to identify compounding pharmacy who could create a topical sodium thiosulfate ointment. The patient has that with him today. Both of the  right foot wounds are now closed. The left dorsal foot wound has contracted but still has a layer of slough on the surface. The medial calf wounds on the right all look a little bit better. They still have heavy slough along with the chunks of calcium. Everything is stained purple. 04/14/2022: The wound on his dorsal left foot is a little bit smaller again today. There is still slough accumulation on the surface. The medial calf wounds have quite a bit less slough accumulation today. His right leg, however, is warm and erythematous, concerning for cellulitis. 04/14/2022: He completed his course of Augmentin yesterday. The medial calf wound sites are much cleaner today and shallower. There is less fragmented calcium in the wounds. He has a lot of lymphedema fluid-like drainage from these wounds, but no purulent discharge or malodor. The wound on his dorsal left foot has also contracted and is shallower. There is a layer of slough over healthy granulation tissue. NATURE, BRAVERMAN ([id number]) [id number] Page 9 of 15 05/05/2022: The left dorsal foot wound is smaller today with less slough and more granulation tissue. The right medial calf wounds are shallower, but have extensive slough and some fat necrosis. The drainage has diminished considerably, however. 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 05/12/2022: The left dorsal foot wound continues to contract and fill with granulation tissue. There is slough on the wound surface. The right medial calf wounds also continue to  fill with healthy tissue, but there is remaining slough and fat necrosis present. He had a significant increase in his drainage this week and it was a bright yellow-green color. He also had a new wound open over his right anterior tibial surface associated with the spike of cutaneous calcium. The leg is more red, as well, concerning for infection. 05/19/2022: His culture returned positive for Pseudomonas. He is currently taking a course of oral levofloxacin. We have also been using topical gentamicin mixed in with his sodium thiosulfate. All of the wounds look better this week. 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 There is slough and nonviable subcutaneous tissue still present. The new anterior tibial wound just has a light layer of slough. The dorsal foot wound also has some slough present and appears a little dry today. 05/26/2022: The right medial leg wound continues to improve. It is feeling with granulation tissue, but still accumulates a fairly thick layer of slough on the surface. The more recent anterior tibial wound has remained quite small, but also has some slough on the surface. The left dorsal foot wound has contracted somewhat and has perimeter epithelialization. He completed his oral levofloxacin. 06/02/2022: The left dorsal foot wound continues to improve significantly. There is just a little slough on the wound surface. The right medial leg wound had black drainage, but it looks like silver alginate was applied last week and I theorized that silver sulfide was created with a combination of the silver alginate and the sodium thiosulfate. It did, however, have a bit of an odor to it and extensive slough accumulation. The small anterior tibial wound also had a bit of slough and nonviable subcutaneous tissue present. The skin of his leg was rather macerated, as well. 06/09/2022: The left dorsal foot wound is smaller again this week with just a light  layer of slough on the surface. The right medial leg wound looks substantially better.  The leg and periwound skin are no longer red and macerated. There is good granulation tissue forming with less slough and nonviable tissue. The anterior tibial wound just has a small amount of slough accumulation. 06/16/2022: The left dorsal foot wound continues to contract. His wrap slipped a little bit, however, and his foot is more swollen. The right medial leg wound continues to improve. The underlying tissue is more vital-appearing and there is less slough. He does have substantial drainage. The small anterior tibial wound has a bit of slough accumulation. 06/23/2022: The left dorsal foot wound is about half the size as it was last week. There is just some light slough on the surface and some eschar buildup around the edges. The right anterior leg wound is small with a little slough on the surface. Unfortunately, the right medial leg wound deteriorated fairly substantially. It is malodorous with gray/green/black discoloration. 06/30/2022: The left dorsal foot wound continues to contract. It is nearly closed with just a light layer of slough present. The right anterior leg wound is about the same size and has a small amount of slough on the surface. The right medial leg wound looks a lot better than it did last week. The odor has abated and the tissue is less pulpy and necrotic-looking. There is still a fairly thick layer of slough present. 12/13; the left dorsal foot wound looks very healthy. There was no need to change the dressing here and no debridement. He has a small area on the right anterior lower leg apparently had not changed much in size. The area on the medial leg is the most problematic area this is large with some depth and a completely nonviable surface today. 07/14/2022: The left dorsal foot wound is smaller today with just a little slough and eschar present. The right anterior lower leg wound is  about the same without any significant change. The medial leg wound has a black and green surface and is malodorous. No purulent drainage and the periwound skin is intact. Edema control is suboptimal. 07/21/2022: The dorsal foot wound continues to contract and is nearly closed with just a little bit of slough and eschar on the surface. The right anterior lower leg wound is shallower today but otherwise the dimensions are unchanged. The medial leg wound looks significantly better although it still has a nonviable surface; the odor and black/green surface has resolved. His culture came back with multiple species sensitive to Augmentin, which was prescribed and a very low level of Pseudomonas, which should be handled by the topical gentamicin we have been using. 07/28/2022: The dorsal foot wound is down to just a very tiny opening with a little eschar on the surface. The right anterior tibial wound is about the same with some slough buildup. The right medial leg wound looks quite a bit better this week. There is thick rubbery slough on the surface, but the odor and drainage has improved significantly. 08/04/2022: The dorsal foot wound is almost healed, but the periwound skin has broken down. This appears to be secondary to moisture and adhesive from the absorbent pad. The right anterior tibial wound is shallower. The right medial leg wound continues to improve. It is smaller, still with rubbery slough on the surface. No malodor or purulent drainage. 08/11/2022: The dorsal foot wound is about the same size. It is a little bit dry with some slough accumulation. The right anterior tibial wound is also unchanged. The right medial leg wound is filling in further with good  granulation tissue. Still with some slough buildup on the surface. 08/18/2022: His left leg is bright red, hot, and swollen with cellulitis. The dorsal foot wound actually looks quite good and is superficial with just a little bit of slough  accumulation. The right anterior tibial wound is unchanged. The right medial leg wound has filled in with good granulation tissue but has copious amounts of drainage. 08/25/2022: His cellulitis has responded nicely to the oral antibiotics. His dorsal foot wound is smaller but has a fair amount of slough buildup. The right anterior tibial wound remains stable and unchanged. The right medial leg wound has more granulation tissue under a layer of slough, but continues to have copious drainage. The drainage is actually causing skin irritation and threatens to result in breakdown. 09/01/2022: He continues to have profuse drainage from his wounds which is resulting in tissue maceration on both the dorsum of his left foot and the periwound distal to his medial leg wound. The dorsal foot wound is nearly healed, despite this. The medial right leg wound is filling in with good granulation tissue. We are working on getting home health assistance to change his dressings more frequently to try and control the drainage better. 09/08/2022: We have had success in controlling his drainage. The tissue maceration has improved significantly and his swelling has come down quite markedly. The medial right leg wound is much more superficial and has substantially less nonviable tissue present. The anterior tibial wound is a little bit larger, however with some nonviable tissue present. 09/15/2022: The dorsal foot wound is nearly closed. The anterior tibial wound measures slightly larger today. The medial right leg wound continues to improve quite dramatically with less nonviable tissue and more robust-looking granulation. Edema control is significantly better. 09/22/2022: The left dorsal foot wound remains open, albeit quite tiny. The anterior tibial wound is a little bit larger again today. The medial right leg wound continues to improve with an increase in robust and healthy granulation tissue and less accumulation of nonviable  tissue. 09/29/2022: His home health providers wrapped his left leg extremely tightly and he ended up having to cut the wrap off of his foot. This resulted in his foot being very swollen and that the dorsal foot wound is a little bit larger today. The anterior tibial wound is about the same size with a little slough on the surface. The medial right leg wound continues to fill in. There are very few areas with any significant depth. There is still slough accumulation. 3/13; Patient has a left dorsal foot wound along with a right anterior tibial wound and right medial wound. We are using silver alginate to the left dorsal foot wound, Santyl and sodium thiosulfate compound ointment to the right lower extremity wounds under 4 layer compression. Patient has no issues or complaints NIGIL, BARILE ([id number]) [id number] Page 10 of 15 today. 10/13/2022: The left dorsal foot wound is a little bit bigger today, but his foot is also a little bit more swollen. The right medial leg wound has filled in more and has substantially less depth. There is still some nonviable tissue present. The small anterior lower leg wound looks about the same. 10/20/2022: The left dorsal foot wound is epithelializing. There does not appear to be much open at this site. There is some eschar around the edges. The right medial leg wound continues to fill in with granulation tissue. The small anterior lower leg wound is a little bit bigger and there is a chunk of calcium  protruding. He continues to have fairly copious drainage from the right leg. 10/27/2022: His home health agency failed to show up at all this past week and as a result, the excessive drainage from his right leg wound has resulted in tissue breakdown. His leg is red and irritated. The wounds themselves look about the same. The wound on his left dorsal foot is nearly closed underneath some eschar. 11/03/2022: We continue to have difficulty with his home  health agency. He continues to have significant drainage from his right leg wound which has resulted in even further tissue breakdown. He also was not taking his Lasix as prescribed and I think much of the drainage has been his lymphedema fluid choosing the path of least resistance. The wound on his left dorsal foot is closed, but in the process of cleaning the site, dry skin was pulled off and he has a new small superficial wound just adjacent to where the dorsal foot wound was. 11/12/2022: His left dorsal foot is completely healed. The right medial leg continues to deteriorate. He is not taking his Lasix as prescribed, nor is he using his lymphedema pumps at all. As a result, he has massive amounts of drainage saturating his dressings and causing further tissue breakdown on his medial leg. Today, his dressings had a foul odor, as well. 11/19/2022: His right medial leg looks even worse today. There is more erythema and moisture related tissue breakdown. The 2 anterior wounds look a little bit better. The culture that I took last week did produce Pseudomonas and he is currently taking levofloxacin for this. 11/26/2022: There has been significant improvement to his right medial leg. It is far less raw and many of the superficial open areas have epithelialized. He continues to cut the foot portion of his wrap and as result his foot is bulbous and swollen compared to the rest of his leg, where edema control is good. The smaller of the 2 anterior tibial wound is closed, while the larger is contracting somewhat with a bit of slough at the base. 12/03/2022: His leg continues to demonstrate improvement. There has been epithelialization of much of the denuded area. He has a new small anterior tibial wound that has opened around a nidus of calcium. Edema control is improved. Patient History Information obtained from Patient, Chart. Family History Cancer - Mother, Heart Disease - Father,Mother, Hypertension -  Father,Siblings, Lung Disease - Siblings, No family history of Diabetes, Hereditary Spherocytosis, Kidney Disease, Seizures, Stroke, Thyroid Problems, Tuberculosis. Social History Former smoker - quit 1991, Marital Status - Married, Alcohol Use - Moderate, Drug Use - No History, Caffeine Use - Daily - coffee. Medical History Eyes Patient has history of Cataracts - right eye removed Denies history of Glaucoma, Optic Neuritis Ear/Nose/Mouth/Throat Denies history of Chronic sinus problems/congestion, Middle ear problems Hematologic/Lymphatic Patient has history of Anemia - macrocytic, Lymphedema Cardiovascular Patient has history of Congestive Heart Failure, Deep Vein Thrombosis - left leg, Hypertension, Peripheral Venous Disease Endocrine Denies history of Type I Diabetes, Type II Diabetes Genitourinary Denies history of End Stage Renal Disease Integumentary (Skin) Denies history of History of Burn Oncologic Denies history of Received Chemotherapy, Received Radiation Psychiatric Denies history of Anorexia/bulimia, Confinement Anxiety Hospitalization/Surgery History - cervical fusion. - lumbar surgery. - coronary stent placement. - lasik eye surgery. - hydrocele excision. Medical A Surgical History Notes nd Constitutional Symptoms (General Health) obesity Ear/Nose/Mouth/Throat hard of hearing Respiratory frozen diaphragm right Cardiovascular hyperlipidemia Musculoskeletal myositis, lumbar DDD, spinal stenosis, cervical facet joint syndrome  Objective Constitutional KENNITH, ABILA ([id number]) [id number] Page 11 of 15 Slightly bradycardic. no acute distress. Vitals Time Taken: 12:50 PM, Height: 71 in, Weight: 267 lbs, BMI: 37.2, Temperature: 97.8 F, Pulse: 55 bpm, Respiratory Rate: 20 breaths/min, Blood Pressure: 126/67 mmHg. Respiratory Normal work of breathing on room air. General Notes: 12/03/2022: His leg continues to demonstrate improvement.  There has been epithelialization of much of the denuded area. He has a new small anterior tibial wound that has opened around a nidus of calcium. Edema control is improved. Integumentary (Hair, Skin) Wound #3 status is Open. Original cause of wound was Gradually Appeared. The date acquired was: 12/07/2021. The wound has been in treatment 42 weeks. The wound is located on the Right,Medial Lower Leg. The wound measures 5.5cm length x 9cm width x 0.2cm depth; 38.877cm^2 area and 7.775cm^3 volume. There is Fat Layer (Subcutaneous Tissue) exposed. There is no tunneling or undermining noted. There is a medium amount of serosanguineous drainage noted. The wound margin is indistinct and nonvisible. There is large (67-100%) red granulation within the wound bed. There is a small (1-33%) amount of necrotic tissue within the wound bed including Adherent Slough. The periwound skin appearance had no abnormalities noted for texture. The periwound skin appearance had no abnormalities noted for moisture. The periwound skin appearance exhibited: Hemosiderin Staining. Wound #6 status is Open. Original cause of wound was Gradually Appeared. The date acquired was: 05/12/2022. The wound has been in treatment 29 weeks. The wound is located on the Right,Anterior Lower Leg. The wound measures 2.2cm length x 0.5cm width x 0.5cm depth; 0.864cm^2 area and 0.432cm^3 volume. There is Fat Layer (Subcutaneous Tissue) exposed. There is no tunneling or undermining noted. There is a medium amount of serosanguineous drainage noted. The wound margin is epibole. There is large (67-100%) red granulation within the wound bed. There is a small (1-33%) amount of necrotic tissue within the wound bed including Adherent Slough. The periwound skin appearance had no abnormalities noted for texture. The periwound skin appearance had no abnormalities noted for moisture. The periwound skin appearance exhibited: Hemosiderin Staining. 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 There was a Selective/Open Wound Non-Viable Tissue Debridement with a total area of 36.91 sq cm performed by Duanne Guess, MD. With the following instrument(s): Curette to remove Non-Viable tissue/material. Material removed includes [facility] after achieving pain control using Lidocaine 4% Topical Solution. No specimens were taken. A time out was conducted at 13:20, prior to the start of the procedure. A Minimum amount of bleeding was controlled with Pressure. The procedure was tolerated well with a pain level of 4 throughout and a pain level of 3 following the procedure. Post Debridement Measurements: 5.5cm length x 9.5cm width x 0.2cm depth; 8.207cm^3 volume. Character of Wound/Ulcer Post Debridement is improved. Post procedure Diagnosis Wound #3: Same as Pre-Procedure General Notes: scribed for Dr. [name] by Zenaida Deed, RN. Pre-procedure diagnosis of Wound #3 is a Lymphedema located on the Right,Medial Lower Leg . There was a Three Layer Compression Therapy Procedure by Zenaida Deed, RN. Post procedure Diagnosis Wound #3: Same as Pre-Procedure Wound #6 Pre-procedure diagnosis of  Wound #6 is a Calcinosis located on the Right,Anterior Lower Leg . There was a Excisional Skin/Subcutaneous Tissue Debridement with a total area of 0.43 sq cm performed by Duanne Guess, MD. With the following instrument(s): Curette to remove Viable and Non-Viable  tissue/material. Material removed includes Subcutaneous Tissue and Slough and after achieving pain control using Lidocaine 4% T opical Solution. No specimens were taken. A time out was conducted at 13:20, prior to the start of the procedure. A Minimum amount of bleeding was controlled with Pressure. The procedure was tolerated well with a pain level of 4 throughout and a pain level of 3 following the procedure. Post Debridement Measurements: 2.2cm length x 0.5cm width x 0.5cm depth; 0.432cm^3 volume. Character of Wound/Ulcer Post Debridement is improved. Post procedure Diagnosis Wound #6: Same as Pre-Procedure General Notes: scribed for Dr. [name] by Zenaida Deed, RN. Plan Follow-up Appointments: Return Appointment in 1 week. - Dr. [name] Rm 1 Friday 5/17 @ 12:30 pm Nurse Visit: - Tuesday 5/14 @ 11:15 am ULYESS, BOICE ([id number]) [id number] Page 12 of 15 Anesthetic: Wound #3 Right,Medial Lower Leg: (In clinic) Topical Lidocaine 4% applied to wound bed Wound #6 Right,Anterior Lower Leg: (In clinic) Topical Lidocaine 4% applied to wound bed Bathing/ Shower/ Hygiene: May shower with protection but do not get wound dressing(s) wet. Protect dressing(s) with water repellant cover (for example, large plastic bag) or a cast cover and may then take shower. Edema Control - Lymphedema / SCD / Other: Lymphedema Pumps. Use Lymphedema pumps on leg(s) 2-3 times a day for 45-60 minutes. If wearing any wraps or hose, do not remove them. Continue exercising as instructed. - use 1-2 times per day over wraps Elevate legs to the level of the heart or above for 30 minutes daily and/or when sitting for 3-4 times a day throughout the day. - throughout the day Avoid standing for long periods of time. Exercise regularly WOUND #3: - Lower Leg Wound Laterality: Right, Medial Peri-Wound Care: Zinc Oxide Ointment 30g tube 2 x Per Week/30 Days Discharge Instructions: Apply Zinc Oxide to  excoriated periwound with each dressing change as needed Peri-Wound Care: Sween Lotion (Moisturizing lotion) 2 x Per Week/30 Days Discharge Instructions: Apply moisturizing lotion as directed Topical: Gentamicin 2 x Per Week/30 Days Discharge Instructions: mix 50:50 with mupirocin to wound bed Prim Dressing: Cutimed Sorbact Swab 2 x Per Week/30 Days ary Discharge Instructions: Apply to wound bed as instructed Secondary Dressing: ABD Pad, 8x10 (Generic) 2 x Per Week/30 Days Discharge Instructions: Apply over primary dressing as directed. Secondary Dressing: Zetuvit Plus 4x8 in 2 x Per Week/30 Days Discharge Instructions: Apply over primary dressing as directed. Com pression Wrap: ThreePress (3 layer compression wrap) 2 x Per Week/30 Days Discharge Instructions: Apply three layer compression as directed. WOUND #6: - Lower Leg Wound Laterality: Right, Anterior Peri-Wound Care: Zinc Oxide Ointment 30g tube 2 x Per Week/30 Days Discharge Instructions: Apply Zinc Oxide to excoriated periwound with each dressing change as needed Peri-Wound Care: Sween Lotion (Moisturizing lotion) 2 x Per Week/30 Days Discharge Instructions: Apply moisturizing lotion as directed Topical: Gentamicin 2 x Per Week/30 Days Discharge Instructions: mix 50:50 with mupirocin to wound bed Prim Dressing: Maxorb Extra Ag+ Alginate Dressing, 2x2 (in/in) 2 x Per Week/30 Days ary Discharge Instructions: Apply to wound bed as instructed Secondary Dressing: ABD Pad, 8x10 (Generic) 2 x Per Week/30 Days Discharge Instructions: Apply over primary dressing as directed. Secondary Dressing: Zetuvit Plus 4x8 in 2 x Per Week/30 Days Discharge Instructions: Apply  over primary dressing as directed. Com pression Wrap: ThreePress (3 layer compression wrap) 2 x Per Week/30 Days Discharge Instructions: Apply three layer compression as directed. 12/03/2022: His leg continues to demonstrate improvement. There has been epithelialization of much  of the denuded area. He has a new small anterior tibial wound that has opened around a nidus of calcium. Edema control is improved. I used a curette to debride slough from the medial leg wound and slough and subcutaneous tissue from the anterior tibial wound. We will continue topical gentamicin with Sorbact to the medial leg and silver alginate to the anterior leg. Continue periwound zinc oxide. Continue 3 layer compression or equivalent. He will have a nurse visit midweek to have his wrap changed and follow-up with me a week from today. Electronic Signature(s) Signed: 12/03/2022 1:54:09 PM By: Duanne Guess MD FACS Entered By: Duanne Guess on 12/03/2022 13:54:09 -------------------------------------------------------------------------------- HxROS Details Patient Name: Date of Service: JAVIONE, MATHERS [name] 12/03/2022 12:30 PM Medical Record Number: [id number] Patient Account Number: [id number] Date of Birth/Sex: Treating RN: 09-02-1950 (72 y.o. M) Primary Care Provider: PA Fidela [place] Other Clinician: Referring Provider: Treating Provider/Extender: [name] in Treatment: 42 Information Obtained From Patient Chart Constitutional Symptoms (General Health) Medical History: Past Medical History Notes: obesity Eyes Medical HistoryMOIR, BANEY ([id number]) [id number] Page 13 of 15 Positive for: Cataracts - right eye removed Negative for: Glaucoma; Optic Neuritis Ear/Nose/Mouth/Throat Medical History: Negative for: Chronic sinus problems/congestion; Middle ear problems Past Medical History Notes: hard of hearing Hematologic/Lymphatic Medical History: Positive for: Anemia - macrocytic; Lymphedema Respiratory Medical History: Past Medical History Notes: frozen diaphragm right Cardiovascular Medical History: Positive for: Congestive Heart Failure; Deep Vein Thrombosis - left leg; Hypertension; Peripheral Venous Disease Past  Medical History Notes: hyperlipidemia Endocrine Medical History: Negative for: Type I Diabetes; Type II Diabetes Genitourinary Medical History: Negative for: End Stage Renal Disease Integumentary (Skin) Medical History: Negative for: History of Burn Musculoskeletal Medical History: Past Medical History Notes: myositis, lumbar DDD, spinal stenosis, cervical facet joint syndrome Oncologic Medical History: Negative for: Received Chemotherapy; Received Radiation Psychiatric Medical History: Negative for: Anorexia/bulimia; Confinement Anxiety HBO Extended History Items Eyes: Cataracts Immunizations Pneumococcal Vaccine: Received Pneumococcal Vaccination: Yes Received Pneumococcal Vaccination On or After 60th Birthday: Yes Implantable Devices No devices added Hospitalization / Surgery History Type of Hospitalization/Surgery cervical fusion lumbar surgery coronary stent placement lasik eye surgery DAQWAN, CHIONG ([id number]) [id number] Page 14 of 15 hydrocele excision Family and Social History Cancer: Yes - Mother; Diabetes: No; Heart Disease: Yes - Father,Mother; Hereditary Spherocytosis: No; Hypertension: Yes - Father,Siblings; Kidney Disease: No; Lung Disease: Yes - Siblings; Seizures: No; Stroke: No; Thyroid Problems: No; Tuberculosis: No; Former smoker - quit 1991; Marital Status - Married; Alcohol Use: Moderate; Drug Use: No History; Caffeine Use: Daily - coffee; Financial Concerns: No; Food, Clothing or Shelter Needs: No; Support System Lacking: No; Transportation Concerns: No Electronic Signature(s) Signed: 12/03/2022 4:20:07 PM By: Duanne Guess MD FACS Entered By: Duanne Guess on 12/03/2022 13:49:26 -------------------------------------------------------------------------------- SuperBill Details Patient Name: Date of Service: KETRICK, PRZYWARA 12/03/2022 Medical Record Number: [id number] Patient Account Number: [id number] Date of  Birth/Sex: Treating RN: Aug 26, 1950 (72 y.o. M) Primary Care Provider: PA [name], NO Other Clinician: Referring Provider: Treating Provider/Extender: [name] in Treatment: 42 Diagnosis Coding ICD-10 Codes Code Description [phone] 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) I10 Essential (primary) hypertension I25.10 Atherosclerotic heart disease  of native coronary artery without angina pectoris I89.0 Lymphedema, not elsewhere classified Z86.718 Personal history of other venous thrombosis and embolism Facility Procedures : CPT4 Code: 16109604 Description: 11042 - DEB SUBQ TISSUE 20 SQ CM/< ICD-10 Diagnosis Description 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 Modifier: d Quantity: 1 : CPT4 Code: [id number] Description: 97597 - DEBRIDE WOUND 1ST 20 SQ CM OR < ICD-10 Diagnosis Description 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 Modifier: d Quantity: 1 : CPT4 Code: [id number] Description: 97598 - DEBRIDE WOUND EA ADDL 20 SQ CM ICD-10 Diagnosis Description 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 Modifier: d Quantity: 1 Physician Procedures : CPT4 Code Description Modifier [id number] 99214 - WC PHYS LEVEL 4 - EST PT 25 ICD-10 Diagnosis Description 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) I89.0 Lymphedema, not elsewhere classified Quantity: 1 : [id number] 11042 - WC PHYS SUBQ TISS 20 SQ CM ICD-10 Diagnosis Description 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ed Quantity: 1 : [id number] 97597 - WC PHYS DEBR WO ANESTH 20 SQ CM ICD-10 Diagnosis  Description 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ed Terris, Jakhari ([id number]) [id number] 97598 - WC  PHYS DEBR WO ANESTH EA ADD 20 CM 1 ICD-10 Diagnosis Description 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ed Quantity: 1 df Page 15 of 15 Electronic Signature(s) Signed: 12/03/2022 1:54:39 PM By: Duanne Guess MD FACS Entered By: Duanne Guess on 12/03/2022 13:54:39

## 2022-12-06 ENCOUNTER — Encounter: Payer: Self-pay | Admitting: Family Medicine

## 2022-12-07 ENCOUNTER — Encounter (HOSPITAL_BASED_OUTPATIENT_CLINIC_OR_DEPARTMENT_OTHER): Payer: Medicare Other | Admitting: General Surgery

## 2022-12-07 DIAGNOSIS — I872 Venous insufficiency (chronic) (peripheral): Secondary | ICD-10-CM | POA: Diagnosis not present

## 2022-12-07 DIAGNOSIS — I509 Heart failure, unspecified: Secondary | ICD-10-CM | POA: Diagnosis not present

## 2022-12-07 DIAGNOSIS — I110 Hypertensive heart disease with heart failure: Secondary | ICD-10-CM | POA: Diagnosis not present

## 2022-12-07 DIAGNOSIS — L97522 Non-pressure chronic ulcer of other part of left foot with fat layer exposed: Secondary | ICD-10-CM | POA: Diagnosis not present

## 2022-12-07 DIAGNOSIS — L942 Calcinosis cutis: Secondary | ICD-10-CM | POA: Diagnosis not present

## 2022-12-07 DIAGNOSIS — L97812 Non-pressure chronic ulcer of other part of right lower leg with fat layer exposed: Secondary | ICD-10-CM | POA: Diagnosis not present

## 2022-12-07 NOTE — Progress Notes (Signed)
ERZA, YOFFE ([id number]) [id number] Page 1 of 1 Visit Report for 12/07/2022 SuperBill Details Patient Name: Date of Service: [name], [name] 12/07/2022 Medical Record Number: [id number] Patient Account Number: [id number] Date of Birth/Sex: Treating RN: [birth year] (72 y.o. [name] Primary Care Provider: PA [name], [place] Other Clinician: Referring Provider: Treating Provider/Extender: [name] in Treatment: 43 Diagnosis Coding ICD-10 Codes Code Description [phone] 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) I10 Essential (primary) hypertension I25.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 angina pectoris I89.0 Lymphedema, not elsewhere classified Z86.718 Personal history of other venous thrombosis and embolism Facility Procedures CPT4 Code Description Modifier Quantity [id number] (Facility Use Only) [phone] - APPLY MULTLAY COMPRS LWR RT LEG 1 Electronic Signature(s) Signed: 12/07/2022 11:41:09 AM By: Duanne Guess MD FACS Signed: 12/07/2022 4:05:01 PM By: Zenaida Deed RN, BSN Entered By: Zenaida Deed on 12/07/2022 11:36:41

## 2022-12-09 ENCOUNTER — Encounter: Payer: Self-pay | Admitting: Family Medicine

## 2022-12-09 ENCOUNTER — Ambulatory Visit (INDEPENDENT_AMBULATORY_CARE_PROVIDER_SITE_OTHER): Payer: Medicare Other | Admitting: Family Medicine

## 2022-12-09 VITALS — BP 115/68 | HR 68 | Temp 98.0°F | Ht 69.5 in | Wt 284.6 lb

## 2022-12-09 DIAGNOSIS — R600 Localized edema: Secondary | ICD-10-CM | POA: Diagnosis not present

## 2022-12-09 DIAGNOSIS — I10 Essential (primary) hypertension: Secondary | ICD-10-CM | POA: Diagnosis not present

## 2022-12-09 DIAGNOSIS — G7241 Inclusion body myositis [IBM]: Secondary | ICD-10-CM | POA: Diagnosis not present

## 2022-12-09 DIAGNOSIS — E7800 Pure hypercholesterolemia, unspecified: Secondary | ICD-10-CM

## 2022-12-09 NOTE — Progress Notes (Signed)
Office Note 12/10/2022  CC:  Chief Complaint  Patient presents with   Establish Care    Covid vaccines x 3; [facility] [place] Pneumonia within the past 5 years Tdap unable to recall date Shingrix -unable to recall  AWV- not within the past year, pt will schedule at a later date.    HPI:  [name] is a 72 y.o. White adult who is here to establish care and f/u HTN, hyperlipidemia, and bilat LE edema. Patient's most recent primary MD: none Old records were reviewed prior to or during today's visit.  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  He has no myalgias. He can ambulate approximately 50 steps.  He uses a power wheelchair everywhere he goes.  Blood pressure has been well-controlled. He has coronary artery disease and is followed by Dr. Tenny Craw in cardiology.  History of venous stasis ulcers, most recently the medial aspect of the right ankle.  This is improving.  He goes to the wound clinic 1-2 times per week.  Past Medical History:  Diagnosis Date   Adenomatous colon polyp - diminutive 06/2019   no recall needed   Bilateral lower extremity edema    Coronary artery disease    a. s/p BMS to LAD (1998) b. s/p DES to RCA (06/24/14)   DVT (deep venous thrombosis) (HCC)    12/2020 L femoral and poplitea.  Unprovoked.  hypercoag w/u NEG.   Eliquis.   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 ?inclusion body myositis? Power WC as of 2022   Macrocytic anemia    Estab with cone hem/onc 08/2022   Osteoarthritis, multiple sites    Palatal fistula    Palpable abd. aorta    Aortic u/s NO ANEURISM 2020   RBBB    Spinal stenosis    Dr Louanna Raw  surgery   Thrombocytopenia  [facility])     Past Surgical History:  Procedure Laterality Date   APPENDECTOMY  2010   CERVICAL FUSION  09/2009   CHOLECYSTECTOMY  1998   COLONOSCOPY     2020-->no recall   EYE SURGERY     Lasik   HYDROCELE EXCISION  2005   LEFT HEART CATHETERIZATION WITH CORONARY ANGIOGRAM N/A 06/24/2014   Procedure: LEFT HEART CATHETERIZATION WITH CORONARY ANGIOGRAM;  Surgeon: Lennette Bihari, MD;  Location: [facility] CATH LAB;  Service: Cardiovascular;  Laterality: N/A;   LUMBAR SPINE SURGERY  11/2011   MUSCLE BIOPSY Left 07/11/2018   Procedure: Left Vastus lateralus muscle biopsy;  Surgeon: Jadene Pierini, MD;  Location: [facility] OR;  Service: Neurosurgery;  Laterality: Left;  Left Vastus lateralus muscle biopsy   PERCUTANEOUS CORONARY STENT INTERVENTION (PCI-S)  06/24/2014   DES to RCA. Procedure: PERCUTANEOUS CORONARY STENT INTERVENTION (PCI-S);  Surgeon: Lennette Bihari, MD;  Location: [facility] CATH LAB;  Service: Cardiovascular;;  Distal RCA   TONSILLECTOMY AND ADENOIDECTOMY  2012   TOTAL HIP ARTHROPLASTY Right 2021   RIGHT   TRANSTHORACIC ECHOCARDIOGRAM     03/2019 normal    Family History  Problem Relation Age of Onset   Heart attack Mother    Esophageal cancer Mother  Aneurysm Father    Heart disease Father    Cystic fibrosis Brother        no prior health issues, rare form of cystic fibrosis of the lungs (aug 2016)   Stroke Neg Hx     Social History   Socioeconomic History   Marital status: Married    Spouse name: Not on file   Number of children: Not on file   Years of education: Not on file   Highest education level: Not on file  Occupational History   Not on file  Tobacco Use   Smoking status: Former    Packs/day: 0.75    Years: 23.00    Additional pack years: 0.00    Total pack years: 17.25    Types: Cigarettes    Quit date: 07/26/1989    Years since quitting: 33.3   Smokeless tobacco: Never  Vaping Use   Vaping Use: Never used  Substance and Sexual Activity   Alcohol use: Yes     Comment: 3-6 cans per week   Drug use: No   Sexual activity: Yes    Partners: Female    Birth control/protection: None    Comment: married  Other Topics Concern   Not on file  Social History Narrative   Married, 2 kids (one is Teacher, early years/pre at [employer]).   Educ: HS   Retired Dance movement psychotherapist   Patient is a former smoker, quit age 24   Alcohol use - yes, hx beer drinker, has approx 6 pack per week   Drug Use- no    caffeine 3 cups coffee daily   Enjoys motorcycles   Social Determinants of Health   Financial Resource Strain: Not on file  Food Insecurity: Not on file  Transportation Needs: Not on file  Physical Activity: Not on file  Stress: Not on file  Social Connections: Not on file  Intimate Partner Violence: Not on file    Outpatient Encounter Medications as of 12/09/2022  Medication Sig   acetaminophen (TYLENOL) 500 MG tablet Take by mouth as needed for moderate pain or mild pain.   apixaban (ELIQUIS) 2.5 MG TABS tablet Take 1 tablet by mouth twice daily   atenolol (TENORMIN) 25 MG tablet Take 1 tablet (25 mg total) by mouth 2 (two) times daily.   b complex vitamins tablet Take 1 tablet by mouth daily.   cholecalciferol (VITAMIN D) 1000 units tablet Take 1,000 Units by mouth daily.   Coenzyme Q10 (CO Q 10) 100 MG CAPS Take 100 mg by mouth daily.    ezetimibe (ZETIA) 10 MG tablet Take 1 tablet (10 mg total) by mouth daily.   folic acid (FOLVITE) 800 MCG tablet Take 800 mcg by mouth daily.    furosemide (LASIX) 40 MG tablet Take 1 tablet (40 mg total) by mouth daily as needed for edema.   glucosamine-chondroitin 500-400 MG tablet Take 2 tablets by mouth daily.    ibuprofen (ADVIL,MOTRIN) 800 MG tablet Take 800 mg by mouth daily.    Krill Oil 500 MG CAPS Take 500 mg by mouth daily.    lisinopril (ZESTRIL) 10 MG tablet Take 1 tablet (10 mg total) by mouth daily.   Melatonin 10 MG TABS Take 20 mg by mouth at bedtime.   potassium chloride (K-DUR) 10 MEQ tablet TAKE 1  TABLET BY MOUTH FOR 3 DAYS THEN TAKE ONLY AS NEEDED WHEN YOU TAKE A LASIX   rosuvastatin (CRESTOR) 5 MG tablet Take 1 tablet (5 mg total) by mouth daily.  Saw Palmetto, Serenoa repens, (SAW PALMETTO PO) Take 300 mg by mouth daily.   triamcinolone cream (KENALOG) 0.1 % Apply 1 application topically as directed.   [DISCONTINUED] sulfamethoxazole-trimethoprim (BACTRIM DS) 800-160 MG tablet Take 1 tablet by mouth 2 (two) times daily.   No facility-administered encounter medications on file as of 12/09/2022.    No Known Allergies  ROS: No headaches or dizziness, no fevers, no chest pain or shortness of breath.  No cough, no shortness of breath. No tremor or paresthesias.  No melena, hematochezia, nosebleeds, excessive bruising, or blood in urine.  PE; Blood pressure 115/68, pulse 68, temperature 98 F (36.7 C), height 5' 9.5" (1.765 m), weight 284 lb 9.6 oz (129.1 kg), SpO2 95 %.Body mass index is 41.43 kg/m.  Physical Exam  Gen: Alert, well appearing, sitting in wheelchair.  Patient is oriented to person, place, time, and situation. AFFECT: pleasant, lucid thought and speech. CV: RRR, no m/r/g.   LUNGS: CTA bilat, nonlabored resps, good aeration in all lung fields. Extremities: Compression stocking on left lower extremity, compression ace wrap on the right lower extremity.  These were not removed today.  He has no edema palpable through these wraps. All fingers of both hands are fixed in extension at the IP joints.  He can fully flex and extend at the MCP joints.  Pertinent labs:  Last CBC Lab Results  Component Value Date   WBC 5.0 09/03/2022   HGB 12.2 (L) 09/03/2022   HCT 36.3 (L) 09/03/2022   MCV 101.4 (H) 09/03/2022   MCH 34.1 (H) 09/03/2022   RDW 12.6 09/03/2022   PLT 107 (L) 09/03/2022   Lab Results  Component Value Date   IRON 48 05/16/2019   TIBC 256 05/16/2019   FERRITIN 531 (H) 05/16/2019   Last metabolic panel Lab Results  Component Value Date   GLUCOSE 108  (H) 09/03/2022   NA 139 09/03/2022   K 4.0 09/03/2022   CL 103 09/03/2022   CO2 31 09/03/2022   BUN 27 (H) 09/03/2022   CREATININE 1.01 09/03/2022   GFRNONAA >60 09/03/2022   CALCIUM 9.0 09/03/2022   PROT 6.5 09/03/2022   ALBUMIN 3.5 09/03/2022   LABGLOB 2.3 12/31/2020   AGRATIO 1.6 12/31/2020   BILITOT 0.3 09/03/2022   ALKPHOS 48 09/03/2022   AST 21 09/03/2022   ALT 15 09/03/2022   ANIONGAP 5 09/03/2022   Last lipids Lab Results  Component Value Date   CHOL 116 12/31/2020   HDL 28 (L) 12/31/2020   LDLCALC 70 12/31/2020   LDLDIRECT 65 05/16/2019   TRIG 92 12/31/2020   CHOLHDL 4.1 12/31/2020   Last hemoglobin A1c Lab Results  Component Value Date   HGBA1C 5.2 04/05/2022   Last thyroid functions Lab Results  Component Value Date   TSH 2.090 05/16/2019   Last vitamin B12 and Folate Lab Results  Component Value Date   VITAMINB12 315 03/12/2022   FOLATE 38.2 03/12/2022   Lab Results  Component Value Date   CKTOTAL 185 12/31/2020   ASSESSMENT AND PLAN:   New patient, establishing care.  1.  Inclusion body myositis. Diffuse weakness, only minimally ambulatory.  Power wheelchair use.  2.  Hypertension, well-controlled on atenolol 25 mg twice daily and lisinopril 10 mg daily. Renal function and electrolytes normal 3 months ago. No labs today--he will get these at his routine cardiology follow-up in July.  #3 hypercholesterolemia, stable on Zetia 10 mg a day and rosuvastatin 5 mg a day.  #4 history of  unprovoked DVT. Eliquis 2.5 mg twice daily indefinitely.  5.  Bilateral lower extremity edema/venous insufficiency. History of right venous stasis ulcer. This is improving with wound clinic management. Lasix 40 mg daily as needed.  An After Visit Summary was printed and given to the patient.  Return in about 6 months (around 06/11/2023) for routine chronic illness f/u.  Signed:  Santiago Bumpers, MD           12/10/2022

## 2022-12-10 ENCOUNTER — Encounter: Payer: Self-pay | Admitting: Family Medicine

## 2022-12-10 ENCOUNTER — Encounter (HOSPITAL_BASED_OUTPATIENT_CLINIC_OR_DEPARTMENT_OTHER): Payer: Medicare Other | Admitting: General Surgery

## 2022-12-10 DIAGNOSIS — I890 Lymphedema, not elsewhere classified: Secondary | ICD-10-CM | POA: Diagnosis not present

## 2022-12-10 DIAGNOSIS — I110 Hypertensive heart disease with heart failure: Secondary | ICD-10-CM | POA: Diagnosis not present

## 2022-12-10 DIAGNOSIS — L942 Calcinosis cutis: Secondary | ICD-10-CM | POA: Diagnosis not present

## 2022-12-10 DIAGNOSIS — L97812 Non-pressure chronic ulcer of other part of right lower leg with fat layer exposed: Secondary | ICD-10-CM | POA: Diagnosis not present

## 2022-12-10 DIAGNOSIS — I872 Venous insufficiency (chronic) (peripheral): Secondary | ICD-10-CM | POA: Diagnosis not present

## 2022-12-10 DIAGNOSIS — L97522 Non-pressure chronic ulcer of other part of left foot with fat layer exposed: Secondary | ICD-10-CM | POA: Diagnosis not present

## 2022-12-10 DIAGNOSIS — I509 Heart failure, unspecified: Secondary | ICD-10-CM | POA: Diagnosis not present

## 2022-12-14 ENCOUNTER — Encounter (HOSPITAL_BASED_OUTPATIENT_CLINIC_OR_DEPARTMENT_OTHER): Payer: Medicare Other | Admitting: General Surgery

## 2022-12-14 DIAGNOSIS — I110 Hypertensive heart disease with heart failure: Secondary | ICD-10-CM | POA: Diagnosis not present

## 2022-12-14 DIAGNOSIS — I509 Heart failure, unspecified: Secondary | ICD-10-CM | POA: Diagnosis not present

## 2022-12-14 DIAGNOSIS — L97522 Non-pressure chronic ulcer of other part of left foot with fat layer exposed: Secondary | ICD-10-CM | POA: Diagnosis not present

## 2022-12-14 DIAGNOSIS — L942 Calcinosis cutis: Secondary | ICD-10-CM | POA: Diagnosis not present

## 2022-12-14 DIAGNOSIS — I872 Venous insufficiency (chronic) (peripheral): Secondary | ICD-10-CM | POA: Diagnosis not present

## 2022-12-14 DIAGNOSIS — L97812 Non-pressure chronic ulcer of other part of right lower leg with fat layer exposed: Secondary | ICD-10-CM | POA: Diagnosis not present

## 2022-12-15 NOTE — Progress Notes (Signed)
NILS, HONOLD ([id number]) [id number] Page 1 of 1 Visit Report for 12/14/2022 SuperBill Details Patient Name: Date of Service: [name], [name] 12/14/2022 Medical Record Number: [id number] Patient Account Number: [id number] Date of Birth/Sex: Treating RN: February 24, 1951 (72 y.o. 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

## 2022-12-17 ENCOUNTER — Encounter (HOSPITAL_BASED_OUTPATIENT_CLINIC_OR_DEPARTMENT_OTHER): Payer: Medicare Other | Admitting: General Surgery

## 2022-12-17 DIAGNOSIS — L97812 Non-pressure chronic ulcer of other part of right lower leg with fat layer exposed: Secondary | ICD-10-CM | POA: Diagnosis not present

## 2022-12-17 DIAGNOSIS — L97522 Non-pressure chronic ulcer of other part of left foot with fat layer exposed: Secondary | ICD-10-CM | POA: Diagnosis not present

## 2022-12-17 DIAGNOSIS — L942 Calcinosis cutis: Secondary | ICD-10-CM | POA: Diagnosis not present

## 2022-12-17 DIAGNOSIS — I890 Lymphedema, not elsewhere classified: Secondary | ICD-10-CM | POA: Diagnosis not present

## 2022-12-17 DIAGNOSIS — I110 Hypertensive heart disease with heart failure: Secondary | ICD-10-CM | POA: Diagnosis not present

## 2022-12-17 DIAGNOSIS — I509 Heart failure, unspecified: Secondary | ICD-10-CM | POA: Diagnosis not present

## 2022-12-17 DIAGNOSIS — I872 Venous insufficiency (chronic) (peripheral): Secondary | ICD-10-CM | POA: Diagnosis not present

## 2022-12-21 ENCOUNTER — Encounter (HOSPITAL_BASED_OUTPATIENT_CLINIC_OR_DEPARTMENT_OTHER): Payer: Medicare Other | Admitting: General Surgery

## 2022-12-21 DIAGNOSIS — I890 Lymphedema, not elsewhere classified: Secondary | ICD-10-CM | POA: Diagnosis not present

## 2022-12-21 DIAGNOSIS — I509 Heart failure, unspecified: Secondary | ICD-10-CM | POA: Diagnosis not present

## 2022-12-21 DIAGNOSIS — L97522 Non-pressure chronic ulcer of other part of left foot with fat layer exposed: Secondary | ICD-10-CM | POA: Diagnosis not present

## 2022-12-21 DIAGNOSIS — I872 Venous insufficiency (chronic) (peripheral): Secondary | ICD-10-CM | POA: Diagnosis not present

## 2022-12-21 DIAGNOSIS — L97812 Non-pressure chronic ulcer of other part of right lower leg with fat layer exposed: Secondary | ICD-10-CM | POA: Diagnosis not present

## 2022-12-21 DIAGNOSIS — L942 Calcinosis cutis: Secondary | ICD-10-CM | POA: Diagnosis not present

## 2022-12-21 DIAGNOSIS — I110 Hypertensive heart disease with heart failure: Secondary | ICD-10-CM | POA: Diagnosis not present

## 2022-12-22 ENCOUNTER — Ambulatory Visit (INDEPENDENT_AMBULATORY_CARE_PROVIDER_SITE_OTHER): Payer: Medicare Other

## 2022-12-22 VITALS — Wt 284.0 lb

## 2022-12-22 DIAGNOSIS — Z0000 Encounter for general adult medical examination without abnormal findings: Secondary | ICD-10-CM | POA: Diagnosis not present

## 2022-12-22 NOTE — Progress Notes (Signed)
I connected with  [name] on 12/22/22 by a audio enabled telemedicine application and verified that I am speaking with the correct person using two identifiers.  Patient Location: Home  Provider Location: Home Office  I discussed the limitations of evaluation and management by telemedicine. The patient expressed understanding and agreed to proceed.   Subjective:   [name] is a 72 y.o. male who presents for Medicare Annual/Subsequent preventive examination.  Review of Systems     Cardiac Risk Factors include: advanced age (>67men, >98 women);male gender;obesity (BMI >30kg/m2)     Objective:    Today's Vitals   12/22/22 1002  Weight: 284 lb (128.8 kg)   Body mass index is 41.34 kg/m.     12/22/2022   10:08 AM 05/22/2019   10:21 AM 07/10/2018    8:16 AM 02/15/2018    3:34 PM 06/24/2014   10:45 AM  Advanced Directives  Does Patient Have a Medical Advance Directive? No No No No No  Would patient like information on creating a medical advance directive? No - Patient declined  No - Patient declined No - Patient declined No - patient declined information    Current Medications (verified) Outpatient Encounter Medications as of 12/22/2022  Medication Sig   acetaminophen (TYLENOL) 500 MG tablet Take by mouth as needed for moderate pain or mild pain.   apixaban (ELIQUIS) 2.5 MG TABS tablet Take 1 tablet by mouth twice daily   atenolol (TENORMIN) 25 MG tablet Take 1 tablet (25 mg total) by mouth 2 (two) times daily.   b complex vitamins tablet Take 1 tablet by mouth daily.   cholecalciferol (VITAMIN D) 1000 units tablet Take 1,000 Units by mouth daily.   ciprofloxacin (CIPRO) 500 MG tablet Take 500 mg by mouth 2 (two) times daily.   Coenzyme Q10 (CO Q 10) 100 MG CAPS Take 100 mg by mouth daily.    ezetimibe (ZETIA) 10 MG tablet Take 1 tablet (10 mg total) by mouth daily.   folic acid (FOLVITE) 800 MCG tablet Take 800 mcg by mouth daily.    furosemide (LASIX) 40 MG tablet  Take 1 tablet (40 mg total) by mouth daily as needed for edema.   glucosamine-chondroitin 500-400 MG tablet Take 2 tablets by mouth daily.    ibuprofen (ADVIL,MOTRIN) 800 MG tablet Take 800 mg by mouth daily.    Krill Oil 500 MG CAPS Take 500 mg by mouth daily.    lisinopril (ZESTRIL) 10 MG tablet Take 1 tablet (10 mg total) by mouth daily.   Melatonin 10 MG TABS Take 20 mg by mouth at bedtime.   potassium chloride (K-DUR) 10 MEQ tablet TAKE 1 TABLET BY MOUTH FOR 3 DAYS THEN TAKE ONLY AS NEEDED WHEN YOU TAKE A LASIX   rosuvastatin (CRESTOR) 5 MG tablet Take 1 tablet (5 mg total) by mouth daily.   Saw Palmetto, Serenoa repens, (SAW PALMETTO PO) Take 300 mg by mouth daily.   triamcinolone cream (KENALOG) 0.1 % Apply 1 application topically as directed. (Patient not taking: Reported on 12/22/2022)   No facility-administered encounter medications on file as of 12/22/2022.    Allergies (verified) Patient has no known allergies.   History: Past Medical History:  Diagnosis Date   Adenomatous colon polyp - diminutive 06/2019   no recall needed   Bilateral lower extremity edema    Coronary artery disease    a. s/p BMS to LAD (1998) b. s/p DES to RCA (06/24/14)   DVT (deep venous thrombosis) (HCC)  12/2020 L femoral and poplitea.  Unprovoked.  hypercoag w/u NEG.   Eliquis.   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 ?inclusion body myositis? Power WC as of 2022   Macrocytic anemia    Estab with [facility]c 08/2022   Osteoarthritis, multiple sites    Palatal fistula    Palpable abd. aorta    Aortic u/s NO ANEURISM 2020   RBBB    Spinal stenosis    Dr Louanna Raw  surgery   Thrombocytopenia [facility])    Past Surgical History:   Procedure Laterality Date   APPENDECTOMY  2010   CERVICAL FUSION  09/2009   CHOLECYSTECTOMY  1998   COLONOSCOPY     2020-->no recall   EYE SURGERY     Lasik   HYDROCELE EXCISION  2005   LEFT HEART CATHETERIZATION WITH CORONARY ANGIOGRAM N/A 06/24/2014   Procedure: LEFT HEART CATHETERIZATION WITH CORONARY ANGIOGRAM;  Surgeon: Lennette Bihari, MD;  Location: [facility] CATH LAB;  Service: Cardiovascular;  Laterality: N/A;   LUMBAR SPINE SURGERY  11/2011   MUSCLE BIOPSY Left 07/11/2018   Procedure: Left Vastus lateralus muscle biopsy;  Surgeon: Jadene Pierini, MD;  Location: [facility] OR;  Service: Neurosurgery;  Laterality: Left;  Left Vastus lateralus muscle biopsy   PERCUTANEOUS CORONARY STENT INTERVENTION (PCI-S)  06/24/2014   DES to RCA. Procedure: PERCUTANEOUS CORONARY STENT INTERVENTION (PCI-S);  Surgeon: Lennette Bihari, MD;  Location: [facility] CATH LAB;  Service: Cardiovascular;;  Distal RCA   TONSILLECTOMY AND ADENOIDECTOMY  2012   TOTAL HIP ARTHROPLASTY Right 2021   RIGHT   TRANSTHORACIC ECHOCARDIOGRAM     03/2019 normal   Family History  Problem Relation Age of Onset   Heart attack Mother    Esophageal cancer Mother    Aneurysm Father    Heart disease Father    Cystic fibrosis Brother        no prior health issues, rare form of cystic fibrosis of the lungs (aug 2016)   Stroke Neg Hx    Social History   Socioeconomic History   Marital status: Married    Spouse name: Not on file   Number of children: Not on file   Years of education: Not on file   Highest education level: Not on file  Occupational History   Not on file  Tobacco Use   Smoking status: Former    Packs/day: 0.75    Years: 23.00    Additional pack years: 0.00    Total pack years: 17.25    Types: Cigarettes    Quit date: 07/26/1989    Years since quitting: 33.4   Smokeless tobacco: Never  Vaping Use   Vaping Use: Never used  Substance and Sexual Activity   Alcohol use: Yes    Comment: 3-6 cans per week   Drug  use: No   Sexual activity: Yes    Partners: Female    Birth control/protection: None    Comment: married  Other Topics Concern   Not on file  Social History Narrative   Married, 2 kids (one is Teacher, early years/pre at [employer]).   Educ: HS   Retired Dance movement psychotherapist  Patient is a former smoker, quit age 57   Alcohol use - yes, hx beer drinker, has approx 6 pack per week   Drug Use- no    caffeine 3 cups coffee daily   Enjoys motorcycles   Social Determinants of Health   Financial Resource Strain: Low Risk  (12/22/2022)   Overall Financial Resource Strain (CARDIA)    Difficulty of Paying Living Expenses: Not hard at all  Food Insecurity: No Food Insecurity (12/22/2022)   Hunger Vital Sign    Worried About Running Out of Food in the Last Year: Never true    Ran Out of Food in the Last Year: Never true  Transportation Needs: No Transportation Needs (12/22/2022)   PRAPARE - Administrator, Civil Service (Medical): No    Lack of Transportation (Non-Medical): No  Physical Activity: Inactive (12/22/2022)   Exercise Vital Sign    Days of Exercise per Week: 0 days    Minutes of Exercise per Session: 0 min  Stress: No Stress Concern Present (12/22/2022)   [employer] of Occupational Health - Occupational Stress Questionnaire    Feeling of Stress : Not at all  Social Connections: Moderately Isolated (12/22/2022)   Social Connection and Isolation Panel [NHANES]    Frequency of Communication with Friends and Family: Once a week    Frequency of Social Gatherings with Friends and Family: Three times a week    Attends Religious Services: Never    Active Member of Clubs or Organizations: No    Attends Engineer, structural: Never    Marital Status: Married    Tobacco Counseling Counseling given: Not Answered   Clinical Intake:  Pre-visit preparation completed: Yes  Pain : No/denies pain     BMI - recorded: 41.34 Nutritional Status: BMI > 30  Obese Nutritional  Risks: None Diabetes: No  How often do you need to have someone help you when you read instructions, pamphlets, or other written materials from your doctor or pharmacy?: 1 - Never  Diabetic?no  Interpreter Needed?: No  Information entered by :: Lanier Ensign, LPN   Activities of Daily Living    12/22/2022   10:09 AM  In your present state of health, do you have any difficulty performing the following activities:  Hearing? 1  Comment wears hearing aids  Vision? 0  Difficulty concentrating or making decisions? 0  Walking or climbing stairs? 1  Comment weak from disease  Dressing or bathing? 0  Doing errands, shopping? 0  Preparing Food and eating ? N  Using the Toilet? N  In the past six months, have you accidently leaked urine? N  Do you have problems with loss of bowel control? N  Managing your Medications? N  Managing your Finances? N  Housekeeping or managing your Housekeeping? N    Patient Care Team: Jeoffrey Massed, MD as PCP - General (Family Medicine) Pricilla Riffle, MD as PCP - Cardiology (Cardiology) Pricilla Riffle, MD as Consulting Physician (Cardiology) [name] Lot, MD as Consulting Physician (Dermatology)  Indicate any recent Medical Services you may have received from other than [facility] providers in the past year (date may be approximate).     Assessment:   This is a routine wellness examination for Urho.  Hearing/Vision screen Hearing Screening - Comments:: Pt wears hearing aids  Vision Screening - Comments:: Encouraged to follow up with provider   Dietary issues and exercise activities discussed: Current Exercise Habits: The patient does not participate in regular exercise  at present   Goals Addressed             This Visit's Progress    Patient Stated       None at this time        Depression Screen    12/22/2022   10:07 AM 12/09/2022    1:22 PM 12/09/2022    1:14 PM 05/22/2019   10:21 AM 05/16/2019    1:29 PM 04/04/2019   10:39  AM 07/20/2018   10:30 AM  PHQ 2/9 Scores  PHQ - 2 Score 0 1 1 0 1 0 0  PHQ- 9 Score 0 4 4  6 3  0    Fall Risk    12/22/2022   10:09 AM 12/09/2022    1:21 PM 12/09/2022    1:14 PM 06/04/2019   11:20 AM 05/22/2019   10:21 AM  Fall Risk   Falls in the past year? 0 1 1 0 1  Number falls in past yr: 0 0 0  0  Injury with Fall? 0 0 0  0  Risk for fall due to : Impaired vision;Impaired mobility;Impaired balance/gait  No Fall Risks    Follow up Falls prevention discussed  Falls evaluation completed      FALL RISK PREVENTION PERTAINING TO THE HOME:  Any stairs in or around the home? Yes  If so, are there any without handrails? No  Home free of loose throw rugs in walkways, pet beds, electrical cords, etc? Yes  Adequate lighting in your home to reduce risk of falls? Yes   ASSISTIVE DEVICES UTILIZED TO PREVENT FALLS:  Life alert? No  Use of a cane, walker or w/c? Yes  Grab bars in the bathroom? Yes  Shower chair or bench in shower? Yes  Elevated toilet seat or a handicapped toilet? No   TIMED UP AND GO:  Was the test performed? No .  Cognitive Function:        12/22/2022   10:11 AM  6CIT Screen  What Year? 0 points  What month? 0 points  What time? 0 points  Count back from 20 0 points  Months in reverse 0 points  Repeat phrase 0 points  Total Score 0 points    Immunizations Immunization History  Administered Date(s) Administered   Fluad Quad(high Dose 65+) 05/16/2019   Influenza, High Dose Seasonal PF 04/27/2018   Influenza,inj,Quad PF,6+ Mos 06/25/2014, 04/26/2016    TDAP status: Due, Education has been provided regarding the importance of this vaccine. Advised may receive this vaccine at local pharmacy or Health Dept. Aware to provide a copy of the vaccination record if obtained from local pharmacy or Health Dept. Verbalized acceptance and understanding.  Flu Vaccine status: Due, Education has been provided regarding the importance of this vaccine. Advised may  receive this vaccine at local pharmacy or Health Dept. Aware to provide a copy of the vaccination record if obtained from local pharmacy or Health Dept. Verbalized acceptance and understanding.  Pneumococcal vaccine status: Due, Education has been provided regarding the importance of this vaccine. Advised may receive this vaccine at local pharmacy or Health Dept. Aware to provide a copy of the vaccination record if obtained from local pharmacy or Health Dept. Verbalized acceptance and understanding.  Covid-19 vaccine status: Information provided on how to obtain vaccines.   Qualifies for Shingles Vaccine? Yes   Zostavax completed No   Shingrix Completed?: No.    Education has been provided regarding the importance of this vaccine. Patient has  been advised to call insurance company to determine out of pocket expense if they have not yet received this vaccine. Advised may also receive vaccine at local pharmacy or Health Dept. Verbalized acceptance and understanding.  Screening Tests Health Maintenance  Topic Date Due   COVID-19 Vaccine (1) Never done   DTaP/Tdap/Td (1 - Tdap) Never done   Zoster Vaccines- Shingrix (1 of 2) Never done   Pneumonia Vaccine 34+ Years old (1 of 1 - PCV) Never done   DEXA SCAN  Never done   INFLUENZA VACCINE  02/24/2023   Medicare Annual Wellness (AWV)  12/22/2023   Colonoscopy  06/26/2029   Hepatitis C Screening  Completed   HPV VACCINES  Aged Out    Health Maintenance  Health Maintenance Due  Topic Date Due   COVID-19 Vaccine (1) Never done   DTaP/Tdap/Td (1 - Tdap) Never done   Zoster Vaccines- Shingrix (1 of 2) Never done   Pneumonia Vaccine 70+ Years old (1 of 1 - PCV) Never done   DEXA SCAN  Never done    Colorectal cancer screening: Type of screening: Colonoscopy. Completed 06/27/19. Repeat every 10 years  Additional Screening:  Hepatitis C Screening:  Completed 03/12/22  Vision Screening: Recommended annual ophthalmology exams for early  detection of glaucoma and other disorders of the eye. Is the patient up to date with their annual eye exam?  No  Who is the provider or what is the name of the office in which the patient attends annual eye exams? Encouraged to follow up with provider  If pt is not established with a provider, would they like to be referred to a provider to establish care? No .   Dental Screening: Recommended annual dental exams for proper oral hygiene  Community Resource Referral / Chronic Care Management: CRR required this visit?  No   CCM required this visit?  No      Plan:     I have personally reviewed and noted the following in the patient's chart:   Medical and social history Use of alcohol, tobacco or illicit drugs  Current medications and supplements including opioid prescriptions. Patient is not currently taking opioid prescriptions. Functional ability and status Nutritional status Physical activity Advanced directives List of other physicians Hospitalizations, surgeries, and ER visits in previous 12 months Vitals Screenings to include cognitive, depression, and falls Referrals and appointments  In addition, I have reviewed and discussed with patient certain preventive protocols, quality metrics, and best practice recommendations. A written personalized care plan for preventive services as well as general preventive health recommendations were provided to patient.     Marzella Schlein, LPN   6/96/2952   Nurse Notes: none

## 2022-12-22 NOTE — Patient Instructions (Signed)
[name] , Thank you for taking time to come for your Medicare Wellness Visit. I appreciate your ongoing commitment to your health goals. Please review the following plan we discussed and let me know if I can assist you in the future.   These are the goals we discussed:  Goals      Patient Stated     None at this time         This is a list of the screening recommended for you and due dates:  Health Maintenance  Topic Date Due   COVID-19 Vaccine (1) Never done   DTaP/Tdap/Td vaccine (1 - Tdap) Never done   Zoster (Shingles) Vaccine (1 of 2) Never done   Pneumonia Vaccine (1 of 1 - PCV) Never done   DEXA scan (bone density measurement)  Never done   Flu Shot  02/24/2023   Medicare Annual Wellness Visit  12/22/2023   Colon Cancer Screening  06/26/2029   Hepatitis C Screening  Completed   HPV Vaccine  Aged Out    Advanced directives: Advance directive discussed with you today. Even though you declined this today please call our office should you change your mind and we can give you the proper paperwork for you to fill out.  Conditions/risks identified: none at this time   Next appointment: Follow up in one year for your annual wellness visit.   Preventive Care 30 Years and Older, Male  Preventive care refers to lifestyle choices and visits with your health care provider that can promote health and wellness. What does preventive care include? A yearly physical exam. This is also called an annual well check. Dental exams once or twice a year. Routine eye exams. Ask your health care provider how often you should have your eyes checked. Personal lifestyle choices, including: Daily care of your teeth and gums. Regular physical activity. Eating a healthy diet. Avoiding tobacco and drug use. Limiting alcohol use. Practicing safe sex. Taking low doses of aspirin every day. Taking vitamin and mineral supplements as recommended by your health care provider. What happens during an  annual well check? The services and screenings done by your health care provider during your annual well check will depend on your age, overall health, lifestyle risk factors, and family history of disease. Counseling  Your health care provider may ask you questions about your: Alcohol use. Tobacco use. Drug use. Emotional well-being. Home and relationship well-being. Sexual activity. Eating habits. History of falls. Memory and ability to understand (cognition). Work and work Astronomer. Screening  You may have the following tests or measurements: Height, weight, and BMI. Blood pressure. Lipid and cholesterol levels. These may be checked every 5 years, or more frequently if you are over 61 years old. Skin check. Lung cancer screening. You may have this screening every year starting at age 61 if you have a 30-pack-year history of smoking and currently smoke or have quit within the past 15 years. Fecal occult blood test (FOBT) of the stool. You may have this test every year starting at age 52. Flexible sigmoidoscopy or colonoscopy. You may have a sigmoidoscopy every 5 years or a colonoscopy every 10 years starting at age 23. Prostate cancer screening. Recommendations will vary depending on your family history and other risks. Hepatitis C blood test. Hepatitis B blood test. Sexually transmitted disease (STD) testing. Diabetes screening. This is done by checking your blood sugar (glucose) after you have not eaten for a while (fasting). You may have this done every  1-3 years. Abdominal aortic aneurysm (AAA) screening. You may need this if you are a current or former smoker. Osteoporosis. You may be screened starting at age 66 if you are at high risk. Talk with your health care provider about your test results, treatment options, and if necessary, the need for more tests. Vaccines  Your health care provider may recommend certain vaccines, such as: Influenza vaccine. This is recommended  every year. Tetanus, diphtheria, and acellular pertussis (Tdap, Td) vaccine. You may need a Td booster every 10 years. Zoster vaccine. You may need this after age 81. Pneumococcal 13-valent conjugate (PCV13) vaccine. One dose is recommended after age 70. Pneumococcal polysaccharide (PPSV23) vaccine. One dose is recommended after age 58. Talk to your health care provider about which screenings and vaccines you need and how often you need them. This information is not intended to replace advice given to you by your health care provider. Make sure you discuss any questions you have with your health care provider. Document Released: 08/08/2015 Document Revised: 03/31/2016 Document Reviewed: 05/13/2015 Elsevier Interactive Patient Education  2017 [employer].  Fall Prevention in the Home Falls can cause injuries. They can happen to people of all ages. There are many things you can do to make your home safe and to help prevent falls. What can I do on the outside of my home? Regularly fix the edges of walkways and driveways and fix any cracks. Remove anything that might make you trip as you walk through a door, such as a raised step or threshold. Trim any bushes or trees on the path to your home. Use bright outdoor lighting. Clear any walking paths of anything that might make someone trip, such as rocks or tools. Regularly check to see if handrails are loose or broken. Make sure that both sides of any steps have handrails. Any raised decks and porches should have guardrails on the edges. Have any leaves, snow, or ice cleared regularly. Use sand or salt on walking paths during winter. Clean up any spills in your garage right away. This includes oil or grease spills. What can I do in the bathroom? Use night lights. Install grab bars by the toilet and in the tub and shower. Do not use towel bars as grab bars. Use non-skid mats or decals in the tub or shower. If you need to sit down in the shower,  use a plastic, non-slip stool. Keep the floor dry. Clean up any water that spills on the floor as soon as it happens. Remove soap buildup in the tub or shower regularly. Attach bath mats securely with double-sided non-slip rug tape. Do not have throw rugs and other things on the floor that can make you trip. What can I do in the bedroom? Use night lights. Make sure that you have a light by your bed that is easy to reach. Do not use any sheets or blankets that are too big for your bed. They should not hang down onto the floor. Have a firm chair that has side arms. You can use this for support while you get dressed. Do not have throw rugs and other things on the floor that can make you trip. What can I do in the kitchen? Clean up any spills right away. Avoid walking on wet floors. Keep items that you use a lot in easy-to-reach places. If you need to reach something above you, use a strong step stool that has a grab bar. Keep electrical cords out of the way.  Do not use floor polish or wax that makes floors slippery. If you must use wax, use non-skid floor wax. Do not have throw rugs and other things on the floor that can make you trip. What can I do with my stairs? Do not leave any items on the stairs. Make sure that there are handrails on both sides of the stairs and use them. Fix handrails that are broken or loose. Make sure that handrails are as long as the stairways. Check any carpeting to make sure that it is firmly attached to the stairs. Fix any carpet that is loose or worn. Avoid having throw rugs at the top or bottom of the stairs. If you do have throw rugs, attach them to the floor with carpet tape. Make sure that you have a light switch at the top of the stairs and the bottom of the stairs. If you do not have them, ask someone to add them for you. What else can I do to help prevent falls? Wear shoes that: Do not have high heels. Have rubber bottoms. Are comfortable and fit you  well. Are closed at the toe. Do not wear sandals. If you use a stepladder: Make sure that it is fully opened. Do not climb a closed stepladder. Make sure that both sides of the stepladder are locked into place. Ask someone to hold it for you, if possible. Clearly mark and make sure that you can see: Any grab bars or handrails. First and last steps. Where the edge of each step is. Use tools that help you move around (mobility aids) if they are needed. These include: Canes. Walkers. Scooters. Crutches. Turn on the lights when you go into a dark area. Replace any light bulbs as soon as they burn out. Set up your furniture so you have a clear path. Avoid moving your furniture around. If any of your floors are uneven, fix them. If there are any pets around you, be aware of where they are. Review your medicines with your doctor. Some medicines can make you feel dizzy. This can increase your chance of falling. Ask your doctor what other things that you can do to help prevent falls. This information is not intended to replace advice given to you by your health care provider. Make sure you discuss any questions you have with your health care provider. Document Released: 05/08/2009 Document Revised: 12/18/2015 Document Reviewed: 08/16/2014 Elsevier Interactive Patient Education  2017 [employer].

## 2022-12-24 ENCOUNTER — Encounter (HOSPITAL_BASED_OUTPATIENT_CLINIC_OR_DEPARTMENT_OTHER): Payer: Medicare Other | Admitting: General Surgery

## 2022-12-24 DIAGNOSIS — L97522 Non-pressure chronic ulcer of other part of left foot with fat layer exposed: Secondary | ICD-10-CM | POA: Diagnosis not present

## 2022-12-24 DIAGNOSIS — I890 Lymphedema, not elsewhere classified: Secondary | ICD-10-CM | POA: Diagnosis not present

## 2022-12-24 DIAGNOSIS — I110 Hypertensive heart disease with heart failure: Secondary | ICD-10-CM | POA: Diagnosis not present

## 2022-12-24 DIAGNOSIS — L97812 Non-pressure chronic ulcer of other part of right lower leg with fat layer exposed: Secondary | ICD-10-CM | POA: Diagnosis not present

## 2022-12-24 DIAGNOSIS — I872 Venous insufficiency (chronic) (peripheral): Secondary | ICD-10-CM | POA: Diagnosis not present

## 2022-12-24 DIAGNOSIS — I509 Heart failure, unspecified: Secondary | ICD-10-CM | POA: Diagnosis not present

## 2022-12-24 DIAGNOSIS — L942 Calcinosis cutis: Secondary | ICD-10-CM | POA: Diagnosis not present

## 2022-12-28 ENCOUNTER — Encounter (HOSPITAL_BASED_OUTPATIENT_CLINIC_OR_DEPARTMENT_OTHER): Payer: Medicare Other | Attending: General Surgery | Admitting: General Surgery

## 2022-12-28 DIAGNOSIS — L942 Calcinosis cutis: Secondary | ICD-10-CM | POA: Insufficient documentation

## 2022-12-28 DIAGNOSIS — Z86718 Personal history of other venous thrombosis and embolism: Secondary | ICD-10-CM | POA: Diagnosis not present

## 2022-12-28 DIAGNOSIS — Z87891 Personal history of nicotine dependence: Secondary | ICD-10-CM | POA: Insufficient documentation

## 2022-12-28 DIAGNOSIS — I509 Heart failure, unspecified: Secondary | ICD-10-CM | POA: Diagnosis not present

## 2022-12-28 DIAGNOSIS — I872 Venous insufficiency (chronic) (peripheral): Secondary | ICD-10-CM | POA: Diagnosis not present

## 2022-12-28 DIAGNOSIS — I110 Hypertensive heart disease with heart failure: Secondary | ICD-10-CM | POA: Insufficient documentation

## 2022-12-28 DIAGNOSIS — L97812 Non-pressure chronic ulcer of other part of right lower leg with fat layer exposed: Secondary | ICD-10-CM | POA: Insufficient documentation

## 2022-12-28 DIAGNOSIS — Z8249 Family history of ischemic heart disease and other diseases of the circulatory system: Secondary | ICD-10-CM | POA: Diagnosis not present

## 2022-12-28 DIAGNOSIS — I2510 Atherosclerotic heart disease of native coronary artery without angina pectoris: Secondary | ICD-10-CM | POA: Diagnosis not present

## 2022-12-28 DIAGNOSIS — I890 Lymphedema, not elsewhere classified: Secondary | ICD-10-CM | POA: Diagnosis not present

## 2022-12-29 NOTE — Progress Notes (Signed)
JIMMIE, TITO ([id number]) [id number] Page 1 of 1 Visit Report for 12/28/2022 SuperBill Details Patient Name: Date of Service: [name], [name] 12/28/2022 Medical Record Number: [id number] Patient Account Number: [id number] Date of Birth/Sex: Treating RN: 05-09-1951 (72 y.o. 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

## 2022-12-31 ENCOUNTER — Encounter (HOSPITAL_BASED_OUTPATIENT_CLINIC_OR_DEPARTMENT_OTHER): Payer: Medicare Other | Admitting: General Surgery

## 2022-12-31 DIAGNOSIS — I890 Lymphedema, not elsewhere classified: Secondary | ICD-10-CM | POA: Diagnosis not present

## 2022-12-31 DIAGNOSIS — I2510 Atherosclerotic heart disease of native coronary artery without angina pectoris: Secondary | ICD-10-CM | POA: Diagnosis not present

## 2022-12-31 DIAGNOSIS — I110 Hypertensive heart disease with heart failure: Secondary | ICD-10-CM | POA: Diagnosis not present

## 2022-12-31 DIAGNOSIS — I872 Venous insufficiency (chronic) (peripheral): Secondary | ICD-10-CM | POA: Diagnosis not present

## 2022-12-31 DIAGNOSIS — L942 Calcinosis cutis: Secondary | ICD-10-CM | POA: Diagnosis not present

## 2022-12-31 DIAGNOSIS — L97812 Non-pressure chronic ulcer of other part of right lower leg with fat layer exposed: Secondary | ICD-10-CM | POA: Diagnosis not present

## 2022-12-31 DIAGNOSIS — I509 Heart failure, unspecified: Secondary | ICD-10-CM | POA: Diagnosis not present

## 2023-01-04 ENCOUNTER — Encounter (HOSPITAL_BASED_OUTPATIENT_CLINIC_OR_DEPARTMENT_OTHER): Payer: Medicare Other | Admitting: General Surgery

## 2023-01-04 DIAGNOSIS — L942 Calcinosis cutis: Secondary | ICD-10-CM | POA: Diagnosis not present

## 2023-01-04 DIAGNOSIS — I872 Venous insufficiency (chronic) (peripheral): Secondary | ICD-10-CM | POA: Diagnosis not present

## 2023-01-04 DIAGNOSIS — I110 Hypertensive heart disease with heart failure: Secondary | ICD-10-CM | POA: Diagnosis not present

## 2023-01-04 DIAGNOSIS — L97812 Non-pressure chronic ulcer of other part of right lower leg with fat layer exposed: Secondary | ICD-10-CM | POA: Diagnosis not present

## 2023-01-04 DIAGNOSIS — I509 Heart failure, unspecified: Secondary | ICD-10-CM | POA: Diagnosis not present

## 2023-01-04 DIAGNOSIS — I2510 Atherosclerotic heart disease of native coronary artery without angina pectoris: Secondary | ICD-10-CM | POA: Diagnosis not present

## 2023-01-05 NOTE — Progress Notes (Signed)
KYROS, SALZWEDEL ([id number]) [id number] Page 1 of 1 Visit Report for 01/04/2023 SuperBill Details Patient Name: Date of Service: [name], [name] 01/04/2023 Medical Record Number: [id number] Patient Account Number: [id number] Date of Birth/Sex: Treating RN: 08/11/1950 (72 y.o. 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

## 2023-01-05 NOTE — Progress Notes (Signed)
[name] ([id number]) [id number] Page 1 of 4 Visit Report for 01/04/2023 Arrival Information Details Patient Name: Date of Service: [name], [name] 01/04/2023 11:15 A M Medical Record Number: [id number] Patient Account Number: [id number] Date of Birth/Sex: Treating RN: 1951/06/24 (72 y.o. [name] Primary Care Sarah Zerby: [name] Other Clinician: Referring Darlena Koval: Treating Bobi Daudelin/Extender: [name] in Treatment: 19 Visit Information History Since Last Visit Added or deleted any medications: No Patient Arrived: Wheel Chair Any new allergies or adverse reactions: No Arrival Time: 12:30 Had a fall or experienced change in No Accompanied By: self activities of daily living that may affect Transfer Assistance: None risk of falls: Patient Identification Verified: Yes Signs or symptoms of abuse/neglect since last visito No Secondary Verification Process Completed: Yes Hospitalized since last visit: No Patient Requires Transmission-Based Precautions: No Implantable device outside of the clinic excluding No Patient Has Alerts: Yes cellular tissue based products placed in the center Patient Alerts: R ABI= N/C, TBI= .75 since last visit: L ABI =N/C, TBI = .57 Has Dressing in Place as Prescribed: Yes Has Compression in Place as Prescribed: Yes Pain Present Now: No Electronic Signature(s) Signed: 01/04/2023 5:04:24 PM By: Zenaida Deed RN, BSN Entered By: Zenaida Deed on 01/04/2023 12:30:51 -------------------------------------------------------------------------------- Compression Therapy Details Patient Name: Date of Service: [name] 01/04/2023 11:15 A M Medical Record Number: [id number] Patient Account Number: [id number] Date of Birth/Sex: Treating RN: 03-Sep-1950 (72 y.o. [name] Primary Care Zaeden Lastinger: [name] Other Clinician: Referring Bryella Diviney: Treating Lianah Peed/Extender: [name] in Treatment: 10 Compression Therapy Performed for Wound Assessment: Wound #3 Right,Medial Lower Leg Performed By: Clinician Zenaida Deed, RN Compression Type: Three Emergency planning/management officer) Signed: 01/04/2023 5:04:24 PM By: Zenaida Deed RN, BSN Entered By: Zenaida Deed on 01/04/2023 12:31:45 -------------------------------------------------------------------------------- Encounter Discharge Information Details Patient Name: Date of Service: [name], [name] 01/04/2023 11:15 A M Medical Record Number: [id number] Patient Account Number: [id number] Date of Birth/Sex: Treating RN: Apr 13, 1951 (72 y.o. [name] Primary Care Daulton Harbaugh: [name] Other Clinician: Referring Klare Criss: Treating Wm Sahagun/Extender: [name] in Treatment: 57 Encounter Discharge Information Items Discharge Condition: Stable Ambulatory Status: Wheelchair Discharge Destination: Home Transportation: Private Auto Accompanied By: self Schedule Follow-up Appointment: Yes Clinical Summary of Care: Patient [name] ([id number]) [id number] Page 2 of 4 Electronic Signature(s) Signed: 01/04/2023 5:04:24 PM By: Zenaida Deed RN, BSN Entered By: Zenaida Deed on 01/04/2023 12:32:53 -------------------------------------------------------------------------------- Patient/Caregiver Education Details Patient Name: Date of Service: [name], [name] 6/11/2024andnbsp11:15 A M Medical Record Number: [id number] Patient Account Number: [id number] Date of Birth/Gender: Treating RN: May 15, 1951 (72 y.o. [name] Primary Care Physician: [name] Other Clinician: Referring Physician: Treating Physician/Extender: [name] in Treatment: 41 Education Assessment Education Provided To: Patient Education Topics Provided Infection: Methods: Explain/Verbal Responses:  Reinforcements needed, State content correctly Venous: Methods: Explain/Verbal Responses: Reinforcements needed, State content correctly Electronic Signature(s) Signed: 01/04/2023 5:04:24 PM By: Zenaida Deed RN, BSN Entered By: Zenaida Deed on 01/04/2023 12:32:37 -------------------------------------------------------------------------------- Wound Assessment Details Patient Name: Date of Service: [name], [name] 01/04/2023 11:15 A M Medical Record Number: [id number] Patient Account Number: [id number] Date of Birth/Sex: Treating RN: June 02, 1951 (72 y.o. [name] Primary Care Abe Schools: [name] Other Clinician: Referring Tomia Enlow: Treating Berdina Cheever/Extender: [name] in Treatment: 75 Wound Status Wound Number: 3 Primary Etiology: Lymphedema Wound Location: Right, Medial Lower Leg Wound Status: Open Wounding Event: Gradually Appeared Date Acquired: 12/07/2021 Weeks Of Treatment: 47  Clustered Wound: No Wound Measurements Length: (cm) 13.8 Width: (cm) 8.3 Depth: (cm) 0.4 Area: (cm) 89.96 Volume: (cm) 35.984 % Reduction in Area: -1584.3% % Reduction in Volume: -1584.6% Wound Description Classification: Full Thickness With Exposed Support S Exudate Amount: Large Exudate Type: Serosanguineous Exudate Color: red, brown tructures [address] [name], [name] ([id number]) [id number] Page 3 of 4 Texture Color No Abnormalities Noted: No No Abnormalities Noted: No Moisture No Abnormalities Noted: No Treatment Notes Wound #3 (Lower Leg) Wound Laterality: Right, Medial Cleanser Soap and Water Discharge Instruction: May shower and wash wound with dial antibacterial soap and water prior to dressing change. Vashe 5.8 (oz) Discharge Instruction: Cleanse the wound with Vashe prior to applying a clean dressing using gauze sponges, not tissue or cotton balls. Peri-Wound Care Zinc Oxide Ointment 30g  tube Discharge Instruction: Apply Zinc Oxide to periwound with each dressing change Sween Lotion (Moisturizing lotion) Discharge Instruction: Apply moisturizing lotion as directed Topical Gentamicin Discharge Instruction: mixed 50:50 with mupirocin to wound bed Mupirocin Ointment Discharge Instruction: Apply Mupirocin (Bactroban) as instructed Primary Dressing Maxorb Extra Ag+ Alginate Dressing, 4x4.75 (in/in) Discharge Instruction: Apply to wound bed until Urgo clean available UrgoClean AG 4 X 5 (in/in) Discharge Instruction: Apply directly to wound bed. Secondary Dressing ABD Pad, 8x10 Discharge Instruction: Apply over primary dressing as directed. Zetuvit Plus 4x8 in Discharge Instruction: Apply over primary dressing as directed. Secured With Compression Wrap ThreePress (3 layer compression wrap) Discharge Instruction: Apply three layer compression as directed. Compression Stockings Add-Ons Electronic Signature(s) Signed: 01/04/2023 5:04:24 PM By: Zenaida Deed RN, BSN Entered By: Zenaida Deed on 01/04/2023 12:31:31 -------------------------------------------------------------------------------- Wound Assessment Details Patient Name: Date of Service: [name], [name] 01/04/2023 11:15 A M Medical Record Number: [id number] Patient Account Number: [id number] Date of Birth/Sex: Treating RN: 10-Oct-1950 (72 y.o. [name] Primary Care Kynsie Falkner: [name] Other Clinician: Referring Teola Felipe: Treating Oiva Dibari/Extender: [name] in Treatment: 55 Wound Status Wound Number: 6 Primary Etiology: Calcinosis Wound Location: Right, Anterior Lower Leg Wound Status: Open Wounding Event: Gradually Appeared Date Acquired: 05/12/2022 Weeks Of Treatment: 33 Clustered Wound: Yes [name], [name] ([id number]) [id number] Page 4 of 4 Wound Measurements Length: (cm) 1.4 Width: (cm) 0.5 Depth: (cm) 0.7 Area: (cm)  0.55 Volume: (cm) 0.385 % Reduction in Area: -133.1% % Reduction in Volume: -1504.2% Wound Description Classification: Full Thickness Without Exposed Suppor Exudate Amount: Small Exudate Type: Serosanguineous Exudate Color: red, brown t Structures Periwound Skin Texture Texture Color No Abnormalities Noted: No No Abnormalities Noted: No Moisture No Abnormalities Noted: No Treatment Notes Wound #6 (Lower Leg) Wound Laterality: Right, Anterior Cleanser Peri-Wound Care Sween Lotion (Moisturizing lotion) Discharge Instruction: Apply moisturizing lotion as directed Topical Gentamicin Discharge Instruction: As directed by physician Primary Dressing Maxorb Extra Ag+ Alginate Dressing, 2x2 (in/in) Discharge Instruction: Apply to wound bed as instructed Secondary Dressing Woven Gauze Sponge, Non-Sterile 4x4 in Discharge Instruction: Apply over primary dressing as directed. Secured With Compression Wrap ThreePress (3 layer compression wrap) Discharge Instruction: Apply three layer compression as directed. Compression Stockings Add-Ons Electronic Signature(s) Signed: 01/04/2023 5:04:24 PM By: Zenaida Deed RN, BSN Entered By: Zenaida Deed on 01/04/2023 12:31:31

## 2023-01-07 ENCOUNTER — Encounter (HOSPITAL_BASED_OUTPATIENT_CLINIC_OR_DEPARTMENT_OTHER): Payer: Medicare Other | Admitting: General Surgery

## 2023-01-07 DIAGNOSIS — I890 Lymphedema, not elsewhere classified: Secondary | ICD-10-CM | POA: Diagnosis not present

## 2023-01-07 DIAGNOSIS — L942 Calcinosis cutis: Secondary | ICD-10-CM | POA: Diagnosis not present

## 2023-01-07 DIAGNOSIS — I2510 Atherosclerotic heart disease of native coronary artery without angina pectoris: Secondary | ICD-10-CM | POA: Diagnosis not present

## 2023-01-07 DIAGNOSIS — I509 Heart failure, unspecified: Secondary | ICD-10-CM | POA: Diagnosis not present

## 2023-01-07 DIAGNOSIS — L97812 Non-pressure chronic ulcer of other part of right lower leg with fat layer exposed: Secondary | ICD-10-CM | POA: Diagnosis not present

## 2023-01-07 DIAGNOSIS — I872 Venous insufficiency (chronic) (peripheral): Secondary | ICD-10-CM | POA: Diagnosis not present

## 2023-01-07 DIAGNOSIS — I110 Hypertensive heart disease with heart failure: Secondary | ICD-10-CM | POA: Diagnosis not present

## 2023-01-09 NOTE — Progress Notes (Signed)
TREASURE, LISONBEE ([id number]) [id number] Page 1 of 10 Visit Report for 01/07/2023 Arrival Information Details Patient Name: Date of Service: [name], [name] 01/07/2023 12:30 PM Medical Record Number: [id number] Patient Account Number: [id number] Date of Birth/Sex: Treating RN: 10-09-1950 (72 y.o. M) Primary Care Bracen Schum: [name] Other Clinician: Referring Mariama Saintvil: Treating Willamae Demby/Extender: [name] in Treatment: 59 Visit Information History Since Last Visit All ordered tests and consults were completed: No Patient Arrived: Wheel Chair Added or deleted any medications: No Arrival Time: 12:48 Any new allergies or adverse reactions: No Accompanied By: self Had a fall or experienced change in No Transfer Assistance: None activities of daily living that may affect Patient Identification Verified: Yes risk of falls: Secondary Verification Process Completed: Yes Signs or symptoms of abuse/neglect since last visito No Patient Requires Transmission-Based Precautions: No Hospitalized since last visit: No Patient Has Alerts: Yes Implantable device outside of the clinic excluding No Patient Alerts: R ABI= N/C, TBI= .75 cellular tissue based products placed in the center L ABI =N/C, TBI = .57 since last visit: Has Dressing in Place as Prescribed: Yes Has Compression in Place as Prescribed: Yes Pain Present Now: No Electronic Signature(s) Signed: 01/07/2023 4:22:01 PM By: Zenaida Deed RN, BSN Entered By: Zenaida Deed on 01/07/2023 13:00:46 -------------------------------------------------------------------------------- Compression Therapy Details Patient Name: Date of Service: [name], [name] 01/07/2023 12:30 PM Medical Record Number: [id number] Patient Account Number: [id number] Date of Birth/Sex: Treating RN: [birth year] (72 y.o. [name] Primary Care Kanani Mowbray: [name] Other Clinician: Referring  Axzel Rockhill: Treating Shoji Pertuit/Extender: [name] in Treatment: 08 Compression Therapy Performed for Wound Assessment: Wound #3 Right,Medial Lower Leg Performed By: Clinician Zenaida Deed, RN Compression Type: Three Layer Post Procedure Diagnosis Same as Pre-procedure Electronic Signature(s) Signed: 01/07/2023 4:22:01 PM By: Zenaida Deed RN, BSN Entered By: Zenaida Deed on 01/07/2023 13:14:34 [name] [name] ([id number]) [id number] Page 2 of 10 -------------------------------------------------------------------------------- Encounter Discharge Information Details Patient Name: Date of Service: [name], [name] 01/07/2023 12:30 PM Medical Record Number: [id number] Patient Account Number: [id number] Date of Birth/Sex: Treating RN: 07/04/1951 (72 y.o. [name] Primary Care Lee Kuang: [name] Other Clinician: Referring Sebron Mcmahill: Treating Vondra Aldredge/Extender: [name] in Treatment: 81 Encounter Discharge Information Items Post Procedure Vitals Discharge Condition: Stable Temperature (F): 98.4 Ambulatory Status: Wheelchair Pulse (bpm): 66 Discharge Destination: Home Respiratory Rate (breaths/min): 18 Transportation: Private Auto Blood Pressure (mmHg): 113/67 Accompanied By: self Schedule Follow-up Appointment: Yes Clinical Summary of Care: Patient Declined Electronic Signature(s) Signed: 01/07/2023 4:22:01 PM By: Zenaida Deed RN, BSN Entered By: Zenaida Deed on 01/07/2023 13:39:10 -------------------------------------------------------------------------------- Lower Extremity Assessment Details Patient Name: Date of Service: [name], [name] 01/07/2023 12:30 PM Medical Record Number: [id number] Patient Account Number: [id number] Date of Birth/Sex: Treating RN: [birth year] (72 y.o. [name] Primary Care Kanyon Bunn: [name] Other Clinician: Referring  Daxon Kyne: Treating Jalal Rauch/Extender: [name] in Treatment: 47 Edema Assessment Assessed: Kyra Searles: No] Franne Forts: No] Edema: [Left: Ye] [Right: s] Calf Left: Right: Point of Measurement: From Medial Instep 39 cm Ankle Left: Right: Point of Measurement: From Medial Instep 27 cm Vascular Assessment Pulses: Dorsalis Pedis Palpable: [Right:Yes] Electronic Signature(s) Signed: 01/07/2023 4:22:01 PM By: Zenaida Deed RN, BSN Entered By: Zenaida Deed on 01/07/2023 13:00:22 [name] [name] ([id number]) [id number] Page 3 of 10 -------------------------------------------------------------------------------- Multi Wound Chart Details Patient Name: Date of Service: [name], [name] 01/07/2023 12:30 PM Medical Record Number: [id number] Patient Account Number: [id number] Date of Birth/Sex: Treating RN: 1951/04/22 (72 y.o. M)  Primary Care Janne Faulk: [name] Other Clinician: Referring Bralen Wiltgen: Treating Brennen Camper/Extender: [name] in Treatment: 85 Vital Signs Height(in): 71 Pulse(bpm): 66 Weight(lbs): 267 Blood Pressure(mmHg): 113/67 Body Mass Index(BMI): 37.2 Temperature(F): 98.4 Respiratory Rate(breaths/min): 20 [3:Photos:] [N/A:N/A] Right, Medial Lower Leg Right, Anterior Lower Leg N/A Wound Location: Gradually Appeared Gradually Appeared N/A Wounding Event: Lymphedema Calcinosis N/A Primary Etiology: Cataracts, Anemia, Lymphedema, Cataracts, Anemia, Lymphedema, N/A Comorbid History: Congestive Heart Failure, Deep Vein Congestive Heart Failure, Deep Vein Thrombosis, Hypertension, Peripheral Thrombosis, Hypertension, Peripheral Venous Disease Venous Disease 12/07/2021 05/12/2022 N/A Date Acquired: 36 34 N/A Weeks of Treatment: Open Open N/A Wound Status: No No N/A Wound Recurrence: No Yes N/A Clustered Wound: N/A 2 N/A Clustered Quantity: 12.3x7.4x0.4 2x0.4x0.6 N/A Measurements L x W  x D (cm) 71.487 0.628 N/A A (cm) : rea 28.595 0.377 N/A Volume (cm) : -1238.50% -166.10% N/A % Reduction in A rea: -1238.70% -1470.80% N/A % Reduction in Volume: Full Thickness With Exposed Support Full Thickness Without Exposed N/A Classification: Structures Support Structures Large Medium N/A Exudate A mount: Serous Serous N/A Exudate Type: amber amber N/A Exudate Color: Flat and Intact Epibole N/A Wound Margin: Small (1-33%) Medium (34-66%) N/A Granulation A mount: Red Red N/A Granulation Quality: Large (67-100%) Small (1-33%) N/A Necrotic A mount: Fat Layer (Subcutaneous Tissue): Yes Fat Layer (Subcutaneous Tissue): Yes N/A Exposed Structures: Fascia: No Fascia: No Tendon: No Tendon: No Muscle: No Muscle: No Joint: No Joint: No Bone: No Bone: No Small (1-33%) Small (1-33%) N/A Epithelialization: Debridement - Selective/Open Wound N/A N/A Debridement: Pre-procedure Verification/Time Out 13:10 N/A N/A Taken: Lidocaine 4% Topical Solution N/A N/A Pain Control: Slough N/A N/A Tissue Debrided: Non-Viable Tissue N/A N/A Level: 53.59 N/A N/A Debridement A (sq cm): rea Curette N/A N/A Instrument: Minimum N/A N/A Bleeding: Pressure N/A N/A Hemostasis A chieved: 4 N/A N/A Procedural Pain: 2 N/A N/A Post Procedural Pain: Procedure was tolerated well N/A N/A Debridement Treatment ResponseARLEN, HAVRANEK ([id number]) [id number] Page 4 of 10 12.3x7.4x0.4 N/A N/A Post Debridement Measurements L x W x D (cm) 28.595 N/A N/A Post Debridement Volume: (cm) Excoriation: Yes No Abnormalities Noted N/A Periwound Skin Texture: No Abnormalities Noted No Abnormalities Noted N/A Periwound Skin Moisture: Hemosiderin Staining: Yes Hemosiderin Staining: Yes N/A Periwound Skin Color: No Abnormality No Abnormality N/A Temperature: Yes N/A N/A Tenderness on Palpation: Compression Therapy N/A N/A Procedures  Performed: Debridement Treatment Notes Electronic Signature(s) Signed: 01/07/2023 1:35:21 PM By: Duanne Guess MD FACS Entered By: Duanne Guess on 01/07/2023 13:35:21 -------------------------------------------------------------------------------- Multi-Disciplinary Care Plan Details Patient Name: Date of Service: [name], [name] 01/07/2023 12:30 PM Medical Record Number: [id number] Patient Account Number: [id number] Date of Birth/Sex: Treating RN: November 04, 1950 (72 y.o. [name] Primary Care Djeneba Barsch: [name] Other Clinician: Referring Kendrick Remigio: Treating Lasheba Stevens/Extender: [name] in Treatment: 7 Multidisciplinary Care Plan reviewed with physician Active Inactive Venous Leg Ulcer Nursing Diagnoses: Knowledge deficit related to disease process and management Potential for venous Insuffiency (use before diagnosis confirmed) Goals: Patient will maintain optimal edema control Date Initiated: 02/17/2022 Target Resolution Date: 02/23/2023 Goal Status: Active Interventions: Assess peripheral edema status every visit. Compression as ordered Provide education on venous insufficiency Treatment Activities: Non-invasive vascular studies : 02/17/2022 Therapeutic compression applied : 02/17/2022 Notes: Wound/Skin Impairment Nursing Diagnoses: Impaired tissue integrity Knowledge deficit related to ulceration/compromised skin integrity Goals: Patient/caregiver will verbalize understanding of skin care regimen Date Initiated: 02/17/2022 Target Resolution Date: 02/23/2023 Goal Status: Active Ulcer/skin breakdown will have a volume reduction of 30%  by week 4 Date Initiated: 02/17/2022 Date Inactivated: 03/10/2022 Target Resolution Date: 03/10/2022 Unmet Reason: infection being treated, Goal Status: Unmet waiting on derm appte DARREK, ROHLIK ([id number]) [id number] Page 5 of 10 Ulcer/skin breakdown will have a volume  reduction of 50% by week 8 Date Initiated: 03/10/2022 Date Inactivated: 04/14/2022 Target Resolution Date: 04/07/2022 Goal Status: Unmet Unmet Reason: calcinosis Ulcer/skin breakdown will have a volume reduction of 80% by week 12 Date Initiated: 04/14/2022 Date Inactivated: 05/05/2022 Target Resolution Date: 05/05/2022 Unmet Reason: infection, chronic Goal Status: Unmet condition Interventions: Assess patient/caregiver ability to obtain necessary supplies Assess patient/caregiver ability to perform ulcer/skin care regimen upon admission and as needed Assess ulceration(s) every visit Provide education on ulcer and skin care Treatment Activities: Skin care regimen initiated : 02/17/2022 Topical wound management initiated : 02/17/2022 Notes: Electronic Signature(s) Signed: 01/07/2023 4:22:01 PM By: Zenaida Deed RN, BSN Entered By: Zenaida Deed on 01/07/2023 12:48:48 -------------------------------------------------------------------------------- Pain Assessment Details Patient Name: Date of Service: [name], [name] 01/07/2023 12:30 PM Medical Record Number: [id number] Patient Account Number: [id number] Date of Birth/Sex: Treating RN: 06-11-1951 (72 y.o. M) Primary Care Mickle Campton: [name] Other Clinician: Referring Dartanyan Deasis: Treating Winn Muehl/Extender: [name] in Treatment: 56 Active Problems Location of Pain Severity and Description of Pain Patient Has Paino No Site Locations Rate the pain. Current Pain Level: 0 Character of Pain Describe the Pain: Tender Pain Management and Medication Current Pain Management: Electronic Signature(s) Signed: 01/07/2023 4:22:01 PM By: Zenaida Deed RN, BSN Entered By: Zenaida Deed on 01/07/2023 13:00:59 [name] [name] ([id number]) [id number] Page 6 of 10 -------------------------------------------------------------------------------- Patient/Caregiver Education  Details Patient Name: Date of Service: [name], [name] 6/14/2024andnbsp12:30 PM Medical Record Number: [id number] Patient Account Number: [id number] Date of Birth/Gender: Treating RN: [birth year] (72 y.o. [name] Primary Care Physician: [name] Other Clinician: Referring Physician: Treating Physician/Extender: [name] in Treatment: 61 Education Assessment Education Provided To: Patient Education Topics Provided Venous: Methods: Explain/Verbal Responses: Reinforcements needed, State content correctly Wound/Skin Impairment: Methods: Explain/Verbal Responses: Reinforcements needed, State content correctly Electronic Signature(s) Signed: 01/07/2023 4:22:01 PM By: Zenaida Deed RN, BSN Entered By: Zenaida Deed on 01/07/2023 12:49:12 -------------------------------------------------------------------------------- Wound Assessment Details Patient Name: Date of Service: [name], [name] 01/07/2023 12:30 PM Medical Record Number: [id number] Patient Account Number: [id number] Date of Birth/Sex: Treating RN: 1951-04-13 (72 y.o. M) Primary Care Golda Zavalza: [name] Other Clinician: Referring Giamarie Bueche: Treating Izaiha Lo/Extender: [name] in Treatment: 47 Wound Status Wound Number: 3 Primary Lymphedema Etiology: Wound Location: Right, Medial Lower Leg Wound Open Wounding Event: Gradually Appeared Status: Date Acquired: 12/07/2021 Comorbid Cataracts, Anemia, Lymphedema, Congestive Heart Failure, Deep Weeks Of Treatment: 47 History: Vein Thrombosis, Hypertension, Peripheral Venous Disease Clustered Wound: No Photos [name], [name] ([id number]) [id number] Page 7 of 10 Wound Measurements Length: (cm) 12.3 Width: (cm) 7.4 Depth: (cm) 0.4 Area: (cm) 71.487 Volume: (cm) 28.595 % Reduction in Area: -1238.5% % Reduction in Volume: -1238.7% Epithelialization: Small  (1-33%) Tunneling: No Undermining: No Wound Description Classification: Full Thickness With Exposed Support Structures Wound Margin: Flat and Intact Exudate Amount: Large Exudate Type: Serous Exudate Color: amber Foul Odor After Cleansing: No Slough/Fibrino Yes Wound Bed Granulation Amount: Small (1-33%) Exposed Structure Granulation Quality: Red Fascia Exposed: No Necrotic Amount: Large (67-100%) Fat Layer (Subcutaneous Tissue) Exposed: Yes Necrotic Quality: Adherent Slough Tendon Exposed: No Muscle Exposed: No Joint Exposed: No Bone Exposed: No Periwound Skin Texture Texture Color No Abnormalities Noted: No No Abnormalities Noted: No Excoriation: Yes Hemosiderin Staining: Yes Moisture  Temperature / Pain No Abnormalities Noted: Yes Temperature: No Abnormality Tenderness on Palpation: Yes Treatment Notes Wound #3 (Lower Leg) Wound Laterality: Right, Medial Cleanser Soap and Water Discharge Instruction: May shower and wash wound with dial antibacterial soap and water prior to dressing change. Vashe 5.8 (oz) Discharge Instruction: Cleanse the wound with Vashe prior to applying a clean dressing using gauze sponges, not tissue or cotton balls. Peri-Wound Care Zinc Oxide Ointment 30g tube Discharge Instruction: Apply Zinc Oxide to periwound with each dressing change Sween Lotion (Moisturizing lotion) Discharge Instruction: Apply moisturizing lotion as directed Topical Gentamicin Discharge Instruction: mixed 50:50 with mupirocin to wound bed Mupirocin Ointment Discharge Instruction: Apply Mupirocin (Bactroban) as instructed Primary Dressing UrgoClean AG 4 X 5 (in/in) Discharge Instruction: Apply directly to wound bed. Secondary Dressing ABD Pad, 8x10 PETE, CIOLLI ([id number]) [id number] Page 8 of 10 Discharge Instruction: Apply over primary dressing as directed. Zetuvit Plus 4x8 in Discharge Instruction: Apply over primary dressing as  directed. Secured With Compression Wrap ThreePress (3 layer compression wrap) Discharge Instruction: Apply three layer compression as directed. Compression Stockings Add-Ons Electronic Signature(s) Signed: 01/07/2023 4:22:01 PM By: Zenaida Deed RN, BSN Entered By: Zenaida Deed on 01/07/2023 13:03:39 -------------------------------------------------------------------------------- Wound Assessment Details Patient Name: Date of Service: [name], [name] 01/07/2023 12:30 PM Medical Record Number: [id number] Patient Account Number: [id number] Date of Birth/Sex: Treating RN: [birth year] (72 y.o. M) Primary Care Minnie Legros: [name] Other Clinician: Referring [name] Bertini: Treating Jaden Batchelder/Extender: [name] in Treatment: 47 Wound Status Wound Number: 6 Primary Calcinosis Etiology: Wound Location: Right, Anterior Lower Leg Wound Open Wounding Event: Gradually Appeared Status: Date Acquired: 05/12/2022 Comorbid Cataracts, Anemia, Lymphedema, Congestive Heart Failure, Deep Weeks Of Treatment: 34 History: Vein Thrombosis, Hypertension, Peripheral Venous Disease Clustered Wound: Yes Photos Wound Measurements Length: (cm) Width: (cm) Depth: (cm) Clustered Quantity: Area: (cm) Volume: (cm) 2 % Reduction in Area: -166.1% 0.4 % Reduction in Volume: -1470.8% 0.6 Epithelialization: Small (1-33%) 2 Tunneling: No 0.628 Undermining: No 0.377 Wound Description Classification: Full Thickness Without Exposed Sup Wound Margin: Epibole Exudate Amount: Medium Exudate Type: Serous Exudate Color: amber port Structures Foul Odor After Cleansing: No Slough/Fibrino Yes Wound Bed Granulation Amount: Medium (34-66%) Exposed [name], [name] ([id number]) [id number] Page 9 of 10 Granulation Quality: Red Fascia Exposed: No Necrotic Amount: Small (1-33%) Fat Layer (Subcutaneous Tissue) Exposed: Yes Necrotic Quality: Adherent  Slough Tendon Exposed: No Muscle Exposed: No Joint Exposed: No Bone Exposed: No Periwound Skin Texture Texture Color No Abnormalities Noted: Yes No Abnormalities Noted: No Hemosiderin Staining: Yes Moisture No Abnormalities Noted: Yes Temperature / Pain Temperature: No Abnormality Treatment Notes Wound #6 (Lower Leg) Wound Laterality: Right, Anterior Cleanser Peri-Wound Care Sween Lotion (Moisturizing lotion) Discharge Instruction: Apply moisturizing lotion as directed Topical Primary Dressing Maxorb Extra Ag+ Alginate Dressing, 2x2 (in/in) Discharge Instruction: Apply to wound bed as instructed Secondary Dressing Woven Gauze Sponge, Non-Sterile 4x4 in Discharge Instruction: Apply over primary dressing as directed. Secured With Compression Wrap ThreePress (3 layer compression wrap) Discharge Instruction: Apply three layer compression as directed. Compression Stockings Add-Ons Electronic Signature(s) Signed: 01/07/2023 4:22:01 PM By: Zenaida Deed RN, BSN Entered By: Zenaida Deed on 01/07/2023 13:06:07 -------------------------------------------------------------------------------- Vitals Details Patient Name: Date of Service: [name] 01/07/2023 12:30 PM Medical Record Number: [id number] Patient Account Number: [id number] Date of Birth/Sex: Treating RN: 20-Aug-1950 (72 y.o. M) Primary Care Treyshaun Keatts: [name] Other Clinician: Referring Celise Bazar: Treating Malikah Lakey/Extender: [name] in Treatment: 44 Vital Signs Time Taken: 12:49 Temperature (F): 98.4 Height (  in): 71 Pulse (bpm): 66 Weight (lbs): 267 Respiratory Rate (breaths/min): 20 Body Mass Index (BMI): 37.2 Blood Pressure (mmHg): 113/67 Reference Range: 80 - 120 mg / dl Electronic Signature(s) Signed: 01/07/2023 4:22:01 PM By: Zenaida Deed RN, BSN Limon, [name] ([id number]) [id number] Page 10 of 10 Signed: 01/07/2023 4:22:01 PM By:  Zenaida Deed RN, BSN Entered By: Zenaida Deed on 01/07/2023 13:00:51

## 2023-01-09 NOTE — Progress Notes (Signed)
[name], [name] ([id number]) [id number] Page 1 of 15 Visit Report for 01/07/2023 Chief Complaint Document Details Patient Name: Date of Service: [name] 01/07/2023 12:30 PM Medical Record Number: [id number] Patient Account Number: [id number] Date of Birth/Sex: Treating RN: 1951-04-15 (72 y.o. M) Primary Care Provider: [name] Other Clinician: Referring Provider: Treating Provider/Extender: [name] in Treatment: 88 Information Obtained from: Patient Chief Complaint Patient seen for complaints of Non-Healing Wounds. Electronic Signature(s) Signed: 01/07/2023 1:35:30 PM By: Duanne Guess MD FACS Entered By: Duanne Guess on 01/07/2023 13:35:30 -------------------------------------------------------------------------------- Debridement Details Patient Name: Date of Service: [name], [name] 01/07/2023 12:30 PM Medical Record Number: [id number] Patient Account Number: [id number] Date of Birth/Sex: Treating RN: Nov 28, 1950 (72 y.o. [name] Primary Care Provider: [name] Other Clinician: Referring Provider: Treating Provider/Extender: [name] in Treatment: 47 Debridement Performed for Assessment: Wound #3 Right,Medial Lower Leg Performed By: Physician Duanne Guess, MD Debridement Type: Debridement Level of Consciousness (Pre-procedure): Awake and Alert Pre-procedure Verification/Time Out Yes - 13:10 Taken: Start Time: 13:14 Pain Control: Lidocaine 4% T opical Solution Percent of Wound Bed Debrided: 75% T Area Debrided (cm): otal 53.59 Tissue and other material debrided: Non-Viable, Slough, Slough Level: Non-Viable Tissue Debridement Description: Selective/Open Wound Instrument: Curette Bleeding: Minimum Hemostasis Achieved: Pressure Procedural Pain: 4 Post Procedural Pain: 2 Response to Treatment: Procedure was tolerated well Level of Consciousness (Post-  Awake and Alert procedure): Post Debridement Measurements of Total Wound Length: (cm) 12.3 Width: (cm) 7.4 Depth: (cm) 0.4 Volume: (cm) 28.595 Character of Wound/Ulcer Post Debridement: Improved Post Procedure Diagnosis [name], [name] ([id number]) [id number] Page 2 of 15 Same as Pre-procedure [name], RN Electronic Signature(s) Signed: 01/07/2023 1:58:04 PM By: Duanne Guess MD FACS Signed: 01/07/2023 4:22:01 PM By: Zenaida Deed RN, BSN Entered By: Zenaida Deed on 01/07/2023 13:17:47 -------------------------------------------------------------------------------- HPI Details Patient Name: Date of Service: [name], [name] 01/07/2023 12:30 PM Medical Record Number: [id number] Patient Account Number: [id number] Date of Birth/Sex: Treating RN: [birth year] (72 y.o. M) Primary Care Provider: [name] Other Clinician: Referring Provider: Treating Provider/Extender: [name] in Treatment: 24 History of Present Illness HPI Description: ADMISSION 02/09/2022 This is a 72 year old man who recently moved to [place] from [place]. He has a history of of coronary artery disease and prior left popliteal vein DVT . He is not diabetic and does not smoke. 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 He has had multiple spinal surgeries and has limited mobility. He primarily uses a motorized wheelchair. He has had lymphedema for many years and does have lymphedema pumps although he says he does not use them frequently. He has wounds on his bilateral lower extremities. He was receiving care in [place] for these prior to his move. They were apparently packing his wounds with Betadine soaked gauze. He has worn compression wraps in the past but not recently. He did say that they helped tremendously. In clinic today, his right ABI is noncompressible but has good signals; the left ABI was  1.17. No formal vascular studies have been performed. On his left dorsal foot, there is a circular lesion that exposes the fat layer. There is fairly heavy slough accumulation within the wound, but no significant odor. On his right medial calf, he has multiple pinholes that have slough buildup in them and probe for about 2 to 4 mm in depth. They are draining clear fluid. On his right lateral midfoot, he has  ulceration consistent with venous stasis. It is geographic and has slough accumulation. 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 02/17/2022: All of the wounds are little bit larger today and have more slough accumulation. Today, the medial calf openings are larger and probe to something that feels calcified. There is odor coming from his wounds. His vascular studies are scheduled for August 9 and 10. 02-24-2022 upon evaluation today patient presents for follow-up concerning his ongoing issues here with his lower extremities. With that being said he does have significant problems with what appears to be cellulitis unfortunately the PCR culture that was sent last week got crushed by [employer] in transit. With that being said we are going to reobtain a culture today although I am actually to do it on the right medial leg as this seems to be the most inflamed area today where there is some questionable drainage as well. I am going to remove some of the calcium deposits which are loosening obtain a deeper culture from this area. Fortunately I do not see any evidence of active infection systemically which is good news but locally I think we are having a bigger issue here. He does have his appointment next Thursday with vascular. Patient has also been referred to [facility] for further evaluation and treatment of the calcinosis for possible sulfate injections. 03/04/2022: The culture that was performed last week grew out  multiple species including Klebsiella, Morganella, and Pseudomonas aeruginosa. He had been prescribed Bactrim but this was not adequate to cover all of the species and levofloxacin was recommended. He completed the Bactrim but did not pick up the levofloxacin and begin taking it until yesterday. 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 He had arterial studies performed today. He does have evidence of peripheral arterial disease. Results copied here: +-------+-----------+-----------+------------+------------+ ABI/TBIT oday's ABIT oday's TBIPrevious ABIPrevious TBI +-------+-----------+-----------+------------+------------+ Right [place] 0.75    +-------+-----------+-----------+------------+------------+ Left Arrey 0.57    +-------+-----------+-----------+------------+------------+ Arterial wall calcification precludes accurate ankle pressures and ABIs. Summary: Right: Resting right ankle-brachial index indicates noncompressible right lower extremity arteries. The right toe-brachial index is normal. Left: Resting left ankle-brachial index indicates noncompressible left lower extremity arteries. The left toe-brachial index is abnormal. He continues to have further tissue breakdown at the right medial calf and there is ample slough accumulation along with necrotic subcutaneous tissue. The left dorsal foot wound has built up slough, as well. The right medial foot wound is nearly closed with just a light layer of eschar over the surface. The right dorsal [name], [name] ([id number]) [id number] Page 3 of 15 lateral foot wound has also accumulated a fair amount of slough and remains quite tender. 03/10/2022: He has been taking the prescribed levofloxacin and has about 3 days left of this. The erythema  and induration on his right leg has improved. He continues to experience further breakdown of the right medial calf wound. There is extensive slough and debris accumulation there. There is heavy slough on his left dorsal foot wound, but no odor or purulent drainage. The right medial foot wound appears to be closed. The right dorsal lateral foot wound also has a fair amount of slough but it is less tender than at our last visit. He still does not have an appointment with dermatology. 03/22/2022: The right anterior tibial  wound has closed. The right lateral foot wound is nearly closed with just a little bit of slough. The left dorsal foot wound is smaller and continues to have some slough buildup but less so than at prior visits. There is good granulation tissue underlying the slough. Unfortunately the right medial calf wound continues to deteriorate. It has a foul odor today and a lot of necrotic tissue, the surrounding skin is also erythematous and moist. 03/30/2022: The right lateral foot wound continues to contract and just has a little bit of slough and eschar present. The left dorsal foot wound is also smaller with slough overlying good granulation tissue. The right medial calf wound actually looks quite a bit better today; there is no odor and there is much less necrotic tissue although some remains present. We have been using topical gentamicin and he has been taking oral Augmentin. 04/07/2022: The patient saw dermatology at [facility] last week. Unfortunately, it appears that they did not read any of the documentation that was provided. 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 Overall, it was a highly unsatisfactory consultation.  In the meantime, however, we were able to identify compounding pharmacy who could create a topical sodium thiosulfate ointment. The patient has that with him today. Both of the right foot wounds are now closed. The left dorsal foot wound has contracted but still has a layer of slough on the surface. The medial calf wounds on the right all look a little bit better. They still have heavy slough along with the chunks of calcium. Everything is stained purple. 04/14/2022: The wound on his dorsal left foot is a little bit smaller again today. There is still slough accumulation on the surface. The medial calf wounds have quite a bit less slough accumulation today. His right leg, however, is warm and erythematous, concerning for cellulitis. 04/14/2022: He completed his course of Augmentin yesterday. The medial calf wound sites are much cleaner today and shallower. There is less fragmented calcium in the wounds. He has a lot of lymphedema fluid-like drainage from these wounds, but no purulent discharge or malodor. The wound on his dorsal left foot has also contracted and is shallower. There is a layer of slough over healthy granulation tissue. 05/05/2022: The left dorsal foot wound is smaller today with less slough and more granulation tissue. The right medial calf wounds are shallower, but have extensive slough and some fat necrosis. The drainage has diminished considerably, however. 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 05/12/2022: The left dorsal foot wound continues to contract and fill with granulation tissue. There is slough on the wound surface. The right medial calf wounds also continue to fill with healthy tissue, but there is remaining slough and fat necrosis present. He had a significant increase in his drainage this week and it was a bright yellow-green color. He also had a new  wound open over his right anterior tibial surface associated with the spike of cutaneous calcium. The leg is more red, as well, concerning for infection. 05/19/2022: His culture returned positive for Pseudomonas. He is currently taking a course of oral levofloxacin. We have also been using  topical gentamicin mixed in with his sodium thiosulfate. All of the wounds look better this week. 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 There is slough and nonviable subcutaneous tissue still present. The new anterior tibial wound just has a light layer of slough. The dorsal foot wound also has some slough present and appears a little dry today. 05/26/2022: The right medial leg wound continues to improve. It is feeling with granulation tissue, but still accumulates a fairly thick layer of slough on the surface. The more recent anterior tibial wound has remained quite small, but also has some slough on the surface. The left dorsal foot wound has contracted somewhat and has perimeter epithelialization. He completed his oral levofloxacin. 06/02/2022: The left dorsal foot wound continues to improve significantly. There is just a little slough on the wound surface. The right medial leg wound had black drainage, but it looks like silver alginate was applied last week and I theorized that silver sulfide was created with a combination of the silver alginate and the sodium thiosulfate. It did, however, have a bit of an odor to it and extensive slough accumulation. The small anterior tibial wound also had a bit of slough and nonviable subcutaneous tissue present. The skin of his leg was rather macerated, as well. 06/09/2022: The left dorsal foot wound is smaller again this week with just a light layer of slough on the surface. The right medial leg wound looks substantially better. The leg and periwound skin are no longer red and macerated. There is good granulation tissue forming with less  slough and nonviable tissue. The anterior tibial wound just has a small amount of slough accumulation. 06/16/2022: The left dorsal foot wound continues to contract. His wrap slipped a little bit, however, and his foot is more swollen. The right medial leg wound continues to improve. The underlying tissue is more vital-appearing and there is less slough. He does have substantial drainage. The small anterior tibial wound has a bit of slough accumulation. 06/23/2022: The left dorsal foot wound is about half the size as it was last week. There is just some light slough on the surface and some eschar buildup around the edges. The right anterior leg wound is small with a little slough on the surface. Unfortunately, the right medial leg wound deteriorated fairly substantially. It is malodorous with gray/green/black discoloration. 06/30/2022: The left dorsal foot wound continues to contract. It is nearly closed with just a light layer of slough present. The right anterior leg wound is about the same size and has a small amount of slough on the surface. The right medial leg wound looks a lot better than it did last week. The odor has abated and the tissue is less pulpy and necrotic-looking. There is still a fairly thick layer of slough present. 12/13; the left dorsal foot wound looks very healthy. There was no need to change the dressing here and no debridement. He has a small area on the right anterior lower leg apparently had not changed much in size. The area on the medial leg is the most problematic area this is large with some depth and a completely nonviable surface today. 07/14/2022: The left dorsal foot wound is smaller today with just a little slough and eschar present. The right anterior lower leg wound is about the same without any significant change. The medial leg wound has a black and green surface and is malodorous. No purulent drainage and the periwound skin is intact. Edema control  is  suboptimal. 07/21/2022: The dorsal foot wound continues to contract and is nearly closed with just a little bit of slough and eschar on the surface. The right anterior lower leg wound is shallower today but otherwise the dimensions are unchanged. The medial leg wound looks significantly better although it still has a nonviable surface; the odor and black/green surface has resolved. His culture came back with multiple species sensitive to Augmentin, which was prescribed and a very low level of Pseudomonas, which should be handled by the topical gentamicin we have been using. 07/28/2022: The dorsal foot wound is down to just a very tiny opening with a little eschar on the surface. The right anterior tibial wound is about the same with some slough buildup. The right medial leg wound looks quite a bit better this week. There is thick rubbery slough on the surface, but the odor and drainage has improved significantly. 08/04/2022: The dorsal foot wound is almost healed, but the periwound skin has broken down. This appears to be secondary to moisture and adhesive from the absorbent pad. The right anterior tibial wound is shallower. The right medial leg wound continues to improve. It is smaller, still with rubbery slough on the surface. No malodor or purulent drainage. 08/11/2022: The dorsal foot wound is about the same size. It is a little bit dry with some slough accumulation. The right anterior tibial wound is also unchanged. The right medial leg wound is filling in further with good granulation tissue. Still with some slough buildup on the surface. RENATO, PENNICK ([id number]) [id number] Page 4 of 15 08/18/2022: His left leg is bright red, hot, and swollen with cellulitis. The dorsal foot wound actually looks quite good and is superficial with just a little bit of slough accumulation. The right anterior tibial wound is unchanged. The right medial leg wound has filled in with good  granulation tissue but has copious amounts of drainage. 08/25/2022: His cellulitis has responded nicely to the oral antibiotics. His dorsal foot wound is smaller but has a fair amount of slough buildup. The right anterior tibial wound remains stable and unchanged. The right medial leg wound has more granulation tissue under a layer of slough, but continues to have copious drainage. The drainage is actually causing skin irritation and threatens to result in breakdown. 09/01/2022: He continues to have profuse drainage from his wounds which is resulting in tissue maceration on both the dorsum of his left foot and the periwound distal to his medial leg wound. The dorsal foot wound is nearly healed, despite this. The medial right leg wound is filling in with good granulation tissue. We are working on getting home health assistance to change his dressings more frequently to try and control the drainage better. 09/08/2022: We have had success in controlling his drainage. The tissue maceration has improved significantly and his swelling has come down quite markedly. The medial right leg wound is much more superficial and has substantially less nonviable tissue present. The anterior tibial wound is a little bit larger, however with some nonviable tissue present. 09/15/2022: The dorsal foot wound is nearly closed. The anterior tibial wound measures slightly larger today. The medial right leg wound continues to improve quite dramatically with less nonviable tissue and more robust-looking granulation. Edema control is significantly better. 09/22/2022: The left dorsal foot wound remains open, albeit quite tiny. The anterior tibial wound is a little bit larger again today. The medial right leg wound continues to improve with an increase in robust and healthy granulation  tissue and less accumulation of nonviable tissue. 09/29/2022: His home health providers wrapped his left leg extremely tightly and he ended up having to  cut the wrap off of his foot. This resulted in his foot being very swollen and that the dorsal foot wound is a little bit larger today. The anterior tibial wound is about the same size with a little slough on the surface. The medial right leg wound continues to fill in. There are very few areas with any significant depth. There is still slough accumulation. 3/13; Patient has a left dorsal foot wound along with a right anterior tibial wound and right medial wound. We are using silver alginate to the left dorsal foot wound, Santyl and sodium thiosulfate compound ointment to the right lower extremity wounds under 4 layer compression. Patient has no issues or complaints today. 10/13/2022: The left dorsal foot wound is a little bit bigger today, but his foot is also a little bit more swollen. The right medial leg wound has filled in more and has substantially less depth. There is still some nonviable tissue present. The small anterior lower leg wound looks about the same. 10/20/2022: The left dorsal foot wound is epithelializing. There does not appear to be much open at this site. There is some eschar around the edges. The right medial leg wound continues to fill in with granulation tissue. The small anterior lower leg wound is a little bit bigger and there is a chunk of calcium protruding. He continues to have fairly copious drainage from the right leg. 10/27/2022: His home health agency failed to show up at all this past week and as a result, the excessive drainage from his right leg wound has resulted in tissue breakdown. His leg is red and irritated. The wounds themselves look about the same. The wound on his left dorsal foot is nearly closed underneath some eschar. 11/03/2022: We continue to have difficulty with his home health agency. He continues to have significant drainage from his right leg wound which has resulted in even further tissue breakdown. He also was not taking his Lasix as prescribed and I  think much of the drainage has been his lymphedema fluid choosing the path of least resistance. The wound on his left dorsal foot is closed, but in the process of cleaning the site, dry skin was pulled off and he has a new small superficial wound just adjacent to where the dorsal foot wound was. 11/12/2022: His left dorsal foot is completely healed. The right medial leg continues to deteriorate. He is not taking his Lasix as prescribed, nor is he using his lymphedema pumps at all. As a result, he has massive amounts of drainage saturating his dressings and causing further tissue breakdown on his medial leg. Today, his dressings had a foul odor, as well. 11/19/2022: His right medial leg looks even worse today. There is more erythema and moisture related tissue breakdown. The 2 anterior wounds look a little bit better. The culture that I took last week did produce Pseudomonas and he is currently taking levofloxacin for this. 11/26/2022: There has been significant improvement to his right medial leg. It is far less raw and many of the superficial open areas have epithelialized. He continues to cut the foot portion of his wrap and as result his foot is bulbous and swollen compared to the rest of his leg, where edema control is good. The smaller of the 2 anterior tibial wound is closed, while the larger is contracting somewhat with a  bit of slough at the base. 12/03/2022: His leg continues to demonstrate improvement. There has been epithelialization of much of the denuded area. He has a new small anterior tibial wound that has opened around a nidus of calcium. Edema control is improved. 12/10/2022: Continued improvement in the periwound skin. The anterior tibial wounds are about the same, but the medial leg wound has improved further. There is less nonviable tissue present. 12/17/2022: Between his visit on Tuesday with the nurse and today, he has had significantly more drainage which has resulted in some  periwound tissue maceration. I suspect this correlates somewhat with his diuretic use, as he says he takes his diuretic religiously on Saturday, Sunday, and Monday and then does not take it after Tuesday due to being out and about and not being close to easy restroom access. I suspect the excess fluid is just simply draining out of his leg. 12/21/2022: This was supposed to be a nurse visit, but his leg has broken down so significantly, that we have converted to a wound care visit. He did take his diuretics over the weekend, but despite this, he has had copious drainage and all of his tissues have broken down substantially from the right medial leg ulcer all the way down to his ankle and even wrapping posterior to this. There is blue-green staining on his dressing, suggesting ongoing presence of Pseudomonas aeruginosa. 12/24/2022: The wound already looks better than it did 2 days ago. He has been taking ciprofloxacin and the Urgo clean dressing we applied did a much better job of wicking the drainage away from his leg. There is slough on the entire surface of the medial leg wound, but the erythema and induration has improved in the past couple of days. 12/31/2022: Continued improvement of the wound. He says that the pain has basically abated since being on ciprofloxacin. Still with fairly heavy drainage and slough accumulation. 01/07/2023: Overall continued improvement. Still with heavy slough buildup, but less drainage. Electronic Signature(s) Signed: 01/07/2023 1:38:08 PM By: Duanne Guess MD FACS Entered By: Duanne Guess on 01/07/2023 13:38:08 [name] ([id number]) [id number] Page 5 of 15 -------------------------------------------------------------------------------- Physical Exam Details Patient Name: Date of Service: [name], [name] 01/07/2023 12:30 PM Medical Record Number: [id number] Patient Account Number: [id number] Date of Birth/Sex: Treating  RN: 17-May-1951 (72 y.o. M) Primary Care Provider: [name] Other Clinician: Referring Provider: Treating Provider/Extender: [name] in Treatment: 88 Constitutional . . . . no acute distress. Respiratory Normal work of breathing on room air. Notes 01/07/2023: Overall continued improvement. Still with heavy slough buildup, but less drainage. Electronic Signature(s) Signed: 01/07/2023 1:38:33 PM By: Duanne Guess MD FACS Entered By: Duanne Guess on 01/07/2023 13:38:33 -------------------------------------------------------------------------------- Physician Orders Details Patient Name: Date of Service: [name], [name] [name] 01/07/2023 12:30 PM Medical Record Number: [id number] Patient Account Number: [id number] Date of Birth/Sex: Treating RN: Jul 06, 1951 (72 y.o. 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 - Dr. [name] RM 1 Return A Friday 6/21 @ 12:30 pm Nurse Visit: -  Tuesday RM 1 11:15 am Anesthetic Wound #3 Right,Medial Lower Leg (In clinic) Topical Lidocaine 4% applied to wound bed Wound #6 Right,Anterior Lower Leg (In clinic) Topical Lidocaine 4% applied to wound bed Bathing/ Shower/ Hygiene May shower with protection but do not get wound dressing(s) wet. Protect dressing(s) with water repellant  cover (for example, large plastic CADE, SWAYZER ([id number]) [id number] Page 6 of 15 bag) or a cast cover and may then take shower. Edema Control - Lymphedema / SCD / Other Lymphedema Pumps. Use Lymphedema pumps on leg(s) 2-3 times a day for 45-60 minutes. If wearing any wraps or hose, do not remove them. Continue exercising as instructed. Elevate legs to the level of the heart or above for 30 minutes daily and/or when sitting for 3-4 times a day throughout the day. - throughout the day Avoid standing for long periods of time. Compression stocking or Garment 20-30 mm/Hg pressure to: - juxtalite left leg daily Wound Treatment Wound #3 - Lower Leg Wound Laterality: Right, Medial Cleanser: Soap and Water 2 x Per Week/30 Days Discharge Instructions: May shower and wash wound with dial antibacterial soap and water prior to dressing change. Cleanser: Vashe 5.8 (oz) 2 x Per Week/30 Days Discharge Instructions: Cleanse the wound with Vashe prior to applying a clean dressing using gauze sponges, not tissue or cotton balls. Peri-Wound Care: Zinc Oxide Ointment 30g tube 2 x Per Week/30 Days Discharge Instructions: Apply Zinc Oxide to periwound with each dressing change Peri-Wound Care: Sween Lotion (Moisturizing lotion) 2 x Per Week/30 Days Discharge Instructions: Apply moisturizing lotion as directed Topical: Gentamicin 2 x Per Week/30 Days Discharge Instructions: mixed 50:50 with mupirocin to wound bed Topical: Mupirocin Ointment 2 x Per Week/30 Days Discharge Instructions: Apply Mupirocin (Bactroban) as instructed Prim Dressing: UrgoClean AG 4 X 5 (in/in) 2 x Per Week/30 Days ary Discharge Instructions: Apply directly to wound bed. Secondary Dressing: ABD Pad, 8x10 2 x Per Week/30 Days Discharge Instructions: Apply over primary dressing as directed. Secondary Dressing: Zetuvit Plus 4x8 in 2 x Per Week/30 Days Discharge Instructions: Apply over primary dressing as  directed. Compression Wrap: ThreePress (3 layer compression wrap) 2 x Per Week/30 Days Discharge Instructions: Apply three layer compression as directed. Wound #6 - Lower Leg Wound Laterality: Right, Anterior Peri-Wound Care: Sween Lotion (Moisturizing lotion) 2 x Per Week/30 Days Discharge Instructions: Apply moisturizing lotion as directed Prim Dressing: Maxorb Extra Ag+ Alginate Dressing, 2x2 (in/in) 2 x Per Week/30 Days ary Discharge Instructions: Apply to wound bed as instructed Secondary Dressing: Woven Gauze Sponge, Non-Sterile 4x4 in 2 x Per Week/30 Days Discharge Instructions: Apply over primary dressing as directed. Compression Wrap: ThreePress (3 layer compression wrap) 2 x Per Week/30 Days Discharge Instructions: Apply three layer compression as directed. Electronic Signature(s) Signed: 01/07/2023 1:58:04 PM By: Duanne Guess MD FACS Entered By: Duanne Guess on 01/07/2023 13:38:42 -------------------------------------------------------------------------------- Problem List Details Patient Name: Date of Service: [name], [name] 01/07/2023 12:30 PM Medical Record Number: [id number] Patient Account Number: [id number] Date of Birth/Sex: Treating RN: 11/26/1950 (72 y.o. 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) 02/09/2022 No Yes I10 Essential (primary) hypertension 02/09/2022 No Yes I25.10  Atherosclerotic heart disease of native coronary artery without angina pectoris 02/09/2022 No Yes I89.0  Lymphedema, not elsewhere classified 02/09/2022 No Yes Z86.718 Personal history of other venous thrombosis and embolism 02/09/2022 No Yes Inactive Problems ICD-10 Code Description Active Date Inactive Date L97.512 Non-pressure chronic ulcer of other part of right foot with fat layer exposed 02/17/2022 02/17/2022 Resolved Problems ICD-10 Code Description Active Date Resolved Date L97.522 Non-pressure chronic ulcer of other part of left foot with fat layer exposed 02/09/2022 02/09/2022 Electronic Signature(s) Signed: 01/07/2023 1:35:15 PM By: Duanne Guess MD FACS Entered By: Duanne Guess on 01/07/2023 13:35:15 -------------------------------------------------------------------------------- Progress Note Details Patient Name: Date of Service: [name] Nakayama [name] 01/07/2023 12:30 PM Medical Record Number: [id number] Patient Account Number: [id number] Date of Birth/Sex: Treating RN: 1950/09/14 (72 y.o. M) Primary Care Provider: [name] Other Clinician: Referring Provider: Treating Provider/Extender: [name] in Treatment: [address] [place] ([id number]) [id number] Page 8 of 15 Chief Complaint Information obtained from Patient Patient seen for complaints of Non-Healing Wounds. History of Present Illness (HPI) ADMISSION 02/09/2022 This is a 72 year old man who recently moved to [place] from [place]. He has a history of of coronary artery disease and prior left popliteal vein DVT . He is not diabetic and does not smoke. 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 He has had multiple spinal surgeries and has limited mobility. He primarily uses a motorized wheelchair. He has had lymphedema for many years and does have lymphedema pumps although he says he does not use them frequently. He has wounds on his bilateral lower extremities. He was receiving care in [place] for these prior to his move. They were  apparently packing his wounds with Betadine soaked gauze. He has worn compression wraps in the past but not recently. He did say that they helped tremendously. In clinic today, his right ABI is noncompressible but has good signals; the left ABI was 1.17. No formal vascular studies have been performed. On his left dorsal foot, there is a circular lesion that exposes the fat layer. There is fairly heavy slough accumulation within the wound, but no significant odor. On his right medial calf, he has multiple pinholes that have slough buildup in them and probe for about 2 to 4 mm in depth. They are draining clear fluid. On his right lateral midfoot, he has ulceration consistent with venous stasis. It is geographic and has slough accumulation. 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 02/17/2022: All of the wounds are little bit larger today and have more slough accumulation. Today, the medial calf openings are larger and probe to something that feels calcified. There is odor coming from his wounds. His vascular studies are scheduled for August 9 and 10. 02-24-2022 upon evaluation today patient presents for follow-up concerning his ongoing issues here with his lower extremities. With that being said he does have significant problems with what appears to be cellulitis unfortunately the PCR culture that was sent last week got crushed by UPS in transit. With that being said we are going to reobtain a culture today although I am actually to do it on the right medial leg as this seems to be the most inflamed area today where there is some questionable drainage as well. I am going to remove some of the calcium deposits which are loosening obtain a deeper culture from this area. Fortunately I do not see any evidence of  active infection systemically which is good news but locally I think we are having a bigger issue here. He does  have his appointment next Thursday with vascular. Patient has also been referred to [facility] for further evaluation and treatment of the calcinosis for possible sulfate injections. 03/04/2022: The culture that was performed last week grew out multiple species including Klebsiella, Morganella, and Pseudomonas aeruginosa. He had been prescribed Bactrim but this was not adequate to cover all of the species and levofloxacin was recommended. He completed the Bactrim but did not pick up the levofloxacin and begin taking it until yesterday. 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 He had arterial studies performed today. He does have evidence of peripheral arterial disease. Results copied here: +-------+-----------+-----------+------------+------------+ ABI/TBIT oday's ABIT oday's TBIPrevious ABIPrevious TBI +-------+-----------+-----------+------------+------------+ Right [place] 0.75    +-------+-----------+-----------+------------+------------+ Left [place] 0.57    +-------+-----------+-----------+------------+------------+ Arterial wall calcification precludes accurate ankle pressures and ABIs. Summary: Right: Resting right ankle-brachial index indicates noncompressible right lower extremity arteries. The right toe-brachial index is normal. Left: Resting left ankle-brachial index indicates noncompressible left lower extremity arteries. The left toe-brachial index is abnormal. He continues to have further tissue breakdown at the right medial calf and there is ample slough accumulation along with necrotic subcutaneous tissue. The left dorsal foot wound has built up slough, as well. The right medial foot wound is nearly closed with just a light layer of eschar over the surface. The right dorsal lateral foot wound has also  accumulated a fair amount of slough and remains quite tender. 03/10/2022: He has been taking the prescribed levofloxacin and has about 3 days left of this. The erythema and induration on his right leg has improved. He continues to experience further breakdown of the right medial calf wound. There is extensive slough and debris accumulation there. There is heavy slough on his left dorsal foot wound, but no odor or purulent drainage. The right medial foot wound appears to be closed. The right dorsal lateral foot wound also has a fair amount of slough but it is less tender than at our last visit. He still does not have an appointment with dermatology. 03/22/2022: The right anterior tibial wound has closed. The right lateral foot wound is nearly closed with just a little bit of slough. The left dorsal foot wound is smaller and continues to have some slough buildup but less so than at prior visits. There is good granulation tissue underlying the slough. Unfortunately the right medial calf wound continues to deteriorate. It has a foul odor today and a lot of necrotic tissue, the surrounding skin is also erythematous and moist. 03/30/2022: The right lateral foot wound continues to contract and just has a little bit of slough and eschar present. The left dorsal foot wound is also smaller with slough overlying good granulation tissue. The right medial calf wound actually looks quite a bit better today; there is no odor and there is much less necrotic tissue although some remains present. We have been using topical gentamicin and he has been taking oral Augmentin. 04/07/2022: The patient saw dermatology at [facility] last week. Unfortunately, it appears that they did not read any of the documentation that was provided. 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 Overall, it was a highly unsatisfactory consultation. In the meantime, however, we were able to identify compounding pharmacy who could create a topical sodium thiosulfate ointment. The patient has that with him today. Both of the right foot wounds are now closed. The left dorsal foot wound has contracted but still has a layer of slough on the surface. The medial calf wounds on the right all look a little bit better. They still have heavy slough along with the chunks of calcium. Everything is stained purple. 04/14/2022: The wound on his dorsal left foot is a little bit smaller again today. There is still slough accumulation on the surface. The medial calf wounds have quite a bit less slough accumulation today. His right leg, however, is warm and erythematous, concerning for cellulitis. 04/14/2022: He completed his course of Augmentin yesterday. The medial calf wound sites are much cleaner today and shallower. There is less fragmented calcium in the wounds. He has a lot of lymphedema fluid-like drainage from these wounds, but no purulent discharge or malodor. The wound on his dorsal left foot has also contracted and is shallower. There is a layer of slough over healthy granulation tissue. 05/05/2022: The left dorsal foot wound is smaller today with less slough and more granulation tissue. The right medial calf wounds are shallower, but have KMARION, [name] ([id number]) [id number] Page 9 of 15 extensive slough and some fat necrosis. The drainage has diminished considerably, however. 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 05/12/2022: The left dorsal foot wound continues to contract and fill with granulation tissue. There is slough on the wound surface.  The right medial calf wounds also continue to fill with healthy tissue, but there is remaining slough and fat necrosis present. He had a significant increase in his drainage this week and it was a bright yellow-green color. He also had a new wound open over his right anterior tibial surface associated with the spike of cutaneous calcium. The leg is more red, as well, concerning for infection. 05/19/2022: His culture returned positive for Pseudomonas. He is currently taking a course of oral levofloxacin. We have also been using topical gentamicin mixed in with his sodium thiosulfate. All of the wounds look better this week. 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 There is slough and nonviable subcutaneous tissue still present. The new anterior tibial wound just has a light layer of slough. The dorsal foot wound also has some slough present and appears a little dry today. 05/26/2022: The right medial leg wound continues to improve. It is feeling with granulation tissue, but still accumulates a fairly thick layer of slough on the surface. The more recent anterior tibial wound has remained quite small, but also has some slough on the surface. The left dorsal foot wound has contracted somewhat and has perimeter epithelialization. He completed his oral levofloxacin. 06/02/2022: The left dorsal foot wound continues to improve significantly. There is just a little slough on the wound surface. The right medial leg wound had black drainage, but it looks like silver alginate was applied last week and I theorized that silver sulfide was created with a combination of the silver alginate and the sodium thiosulfate. It did, however, have a bit of an odor to it and extensive  slough accumulation. The small anterior tibial wound also had a bit of slough and nonviable subcutaneous tissue present. The skin of his leg was rather macerated, as well. 06/09/2022: The left dorsal foot wound  is smaller again this week with just a light layer of slough on the surface. The right medial leg wound looks substantially better. The leg and periwound skin are no longer red and macerated. There is good granulation tissue forming with less slough and nonviable tissue. The anterior tibial wound just has a small amount of slough accumulation. 06/16/2022: The left dorsal foot wound continues to contract. His wrap slipped a little bit, however, and his foot is more swollen. The right medial leg wound continues to improve. The underlying tissue is more vital-appearing and there is less slough. He does have substantial drainage. The small anterior tibial wound has a bit of slough accumulation. 06/23/2022: The left dorsal foot wound is about half the size as it was last week. There is just some light slough on the surface and some eschar buildup around the edges. The right anterior leg wound is small with a little slough on the surface. Unfortunately, the right medial leg wound deteriorated fairly substantially. It is malodorous with gray/green/black discoloration. 06/30/2022: The left dorsal foot wound continues to contract. It is nearly closed with just a light layer of slough present. The right anterior leg wound is about the same size and has a small amount of slough on the surface. The right medial leg wound looks a lot better than it did last week. The odor has abated and the tissue is less pulpy and necrotic-looking. There is still a fairly thick layer of slough present. 12/13; the left dorsal foot wound looks very healthy. There was no need to change the dressing here and no debridement. He has a small area on the right anterior lower leg apparently had not changed much in size. The area on the medial leg is the most problematic area this is large with some depth and a completely nonviable surface today. 07/14/2022: The left dorsal foot wound is smaller today with just a little slough and eschar  present. The right anterior lower leg wound is about the same without any significant change. The medial leg wound has a black and green surface and is malodorous. No purulent drainage and the periwound skin is intact. Edema control is suboptimal. 07/21/2022: The dorsal foot wound continues to contract and is nearly closed with just a little bit of slough and eschar on the surface. The right anterior lower leg wound is shallower today but otherwise the dimensions are unchanged. The medial leg wound looks significantly better although it still has a nonviable surface; the odor and black/green surface has resolved. His culture came back with multiple species sensitive to Augmentin, which was prescribed and a very low level of Pseudomonas, which should be handled by the topical gentamicin we have been using. 07/28/2022: The dorsal foot wound is down to just a very tiny opening with a little eschar on the surface. The right anterior tibial wound is about the same with some slough buildup. The right medial leg wound looks quite a bit better this week. There is thick rubbery slough on the surface, but the odor and drainage has improved significantly. 08/04/2022: The dorsal foot wound is almost healed, but the periwound skin has broken down. This appears to be secondary to moisture and adhesive from the absorbent pad. The right anterior tibial wound is shallower. The right medial leg  wound continues to improve. It is smaller, still with rubbery slough on the surface. No malodor or purulent drainage. 08/11/2022: The dorsal foot wound is about the same size. It is a little bit dry with some slough accumulation. The right anterior tibial wound is also unchanged. The right medial leg wound is filling in further with good granulation tissue. Still with some slough buildup on the surface. 08/18/2022: His left leg is bright red, hot, and swollen with cellulitis. The dorsal foot wound actually looks quite good and is  superficial with just a little bit of slough accumulation. The right anterior tibial wound is unchanged. The right medial leg wound has filled in with good granulation tissue but has copious amounts of drainage. 08/25/2022: His cellulitis has responded nicely to the oral antibiotics. His dorsal foot wound is smaller but has a fair amount of slough buildup. The right anterior tibial wound remains stable and unchanged. The right medial leg wound has more granulation tissue under a layer of slough, but continues to have copious drainage. The drainage is actually causing skin irritation and threatens to result in breakdown. 09/01/2022: He continues to have profuse drainage from his wounds which is resulting in tissue maceration on both the dorsum of his left foot and the periwound distal to his medial leg wound. The dorsal foot wound is nearly healed, despite this. The medial right leg wound is filling in with good granulation tissue. We are working on getting home health assistance to change his dressings more frequently to try and control the drainage better. 09/08/2022: We have had success in controlling his drainage. The tissue maceration has improved significantly and his swelling has come down quite markedly. The medial right leg wound is much more superficial and has substantially less nonviable tissue present. The anterior tibial wound is a little bit larger, however with some nonviable tissue present. 09/15/2022: The dorsal foot wound is nearly closed. The anterior tibial wound measures slightly larger today. The medial right leg wound continues to improve quite dramatically with less nonviable tissue and more robust-looking granulation. Edema control is significantly better. 09/22/2022: The left dorsal foot wound remains open, albeit quite tiny. The anterior tibial wound is a little bit larger again today. The medial right leg wound continues to improve with an increase in robust and healthy  granulation tissue and less accumulation of nonviable tissue. 09/29/2022: His home health providers wrapped his left leg extremely tightly and he ended up having to cut the wrap off of his foot. This resulted in his foot being very swollen and that the dorsal foot wound is a little bit larger today. The anterior tibial wound is about the same size with a little slough on the surface. The medial right leg wound continues to fill in. There are very few areas with any significant depth. There is still slough accumulation. 3/13; Patient has a left dorsal foot wound along with a right anterior tibial wound and right medial wound. We are using silver alginate to the left dorsal foot wound, Santyl and sodium thiosulfate compound ointment to the right lower extremity wounds under 4 layer compression. Patient has no issues or complaints today. JULEON, BOSLEY ([id number]) [id number] Page 10 of 15 10/13/2022: The left dorsal foot wound is a little bit bigger today, but his foot is also a little bit more swollen. The right medial leg wound has filled in more and has substantially less depth. There is still some nonviable tissue present. The small anterior lower leg wound looks about  the same. 10/20/2022: The left dorsal foot wound is epithelializing. There does not appear to be much open at this site. There is some eschar around the edges. The right medial leg wound continues to fill in with granulation tissue. The small anterior lower leg wound is a little bit bigger and there is a chunk of calcium protruding. He continues to have fairly copious drainage from the right leg. 10/27/2022: His home health agency failed to show up at all this past week and as a result, the excessive drainage from his right leg wound has resulted in tissue breakdown. His leg is red and irritated. The wounds themselves look about the same. The wound on his left dorsal foot is nearly closed underneath  some eschar. 11/03/2022: We continue to have difficulty with his home health agency. He continues to have significant drainage from his right leg wound which has resulted in even further tissue breakdown. He also was not taking his Lasix as prescribed and I think much of the drainage has been his lymphedema fluid choosing the path of least resistance. The wound on his left dorsal foot is closed, but in the process of cleaning the site, dry skin was pulled off and he has a new small superficial wound just adjacent to where the dorsal foot wound was. 11/12/2022: His left dorsal foot is completely healed. The right medial leg continues to deteriorate. He is not taking his Lasix as prescribed, nor is he using his lymphedema pumps at all. As a result, he has massive amounts of drainage saturating his dressings and causing further tissue breakdown on his medial leg. Today, his dressings had a foul odor, as well. 11/19/2022: His right medial leg looks even worse today. There is more erythema and moisture related tissue breakdown. The 2 anterior wounds look a little bit better. The culture that I took last week did produce Pseudomonas and he is currently taking levofloxacin for this. 11/26/2022: There has been significant improvement to his right medial leg. It is far less raw and many of the superficial open areas have epithelialized. He continues to cut the foot portion of his wrap and as result his foot is bulbous and swollen compared to the rest of his leg, where edema control is good. The smaller of the 2 anterior tibial wound is closed, while the larger is contracting somewhat with a bit of slough at the base. 12/03/2022: His leg continues to demonstrate improvement. There has been epithelialization of much of the denuded area. He has a new small anterior tibial wound that has opened around a nidus of calcium. Edema control is improved. 12/10/2022: Continued improvement in the periwound skin. The anterior  tibial wounds are about the same, but the medial leg wound has improved further. There is less nonviable tissue present. 12/17/2022: Between his visit on Tuesday with the nurse and today, he has had significantly more drainage which has resulted in some periwound tissue maceration. I suspect this correlates somewhat with his diuretic use, as he says he takes his diuretic religiously on Saturday, Sunday, and Monday and then does not take it after Tuesday due to being out and about and not being close to easy restroom access. I suspect the excess fluid is just simply draining out of his leg. 12/21/2022: This was supposed to be a nurse visit, but his leg has broken down so significantly, that we have converted to a wound care visit. He did take his diuretics over the weekend, but despite this, he has had  copious drainage and all of his tissues have broken down substantially from the right medial leg ulcer all the way down to his ankle and even wrapping posterior to this. There is blue-green staining on his dressing, suggesting ongoing presence of Pseudomonas aeruginosa. 12/24/2022: The wound already looks better than it did 2 days ago. He has been taking ciprofloxacin and the Urgo clean dressing we applied did a much better job of wicking the drainage away from his leg. There is slough on the entire surface of the medial leg wound, but the erythema and induration has improved in the past couple of days. 12/31/2022: Continued improvement of the wound. He says that the pain has basically abated since being on ciprofloxacin. Still with fairly heavy drainage and slough accumulation. 01/07/2023: Overall continued improvement. Still with heavy slough buildup, but less drainage. Patient History Information obtained from Patient, Chart. Family History Cancer - Mother, Heart Disease - Father,Mother, Hypertension - Father,Siblings, Lung Disease - Siblings, No family history of Diabetes, Hereditary Spherocytosis,  Kidney Disease, Seizures, Stroke, Thyroid Problems, Tuberculosis. Social History Former smoker - quit 1991, Marital Status - Married, Alcohol Use - Moderate, Drug Use - No History, Caffeine Use - Daily - coffee. Medical History Eyes Patient has history of Cataracts - right eye removed Denies history of Glaucoma, Optic Neuritis Ear/Nose/Mouth/Throat Denies history of Chronic sinus problems/congestion, Middle ear problems Hematologic/Lymphatic Patient has history of Anemia - macrocytic, Lymphedema Cardiovascular Patient has history of Congestive Heart Failure, Deep Vein Thrombosis - left leg, Hypertension, Peripheral Venous Disease Endocrine Denies history of Type I Diabetes, Type II Diabetes Genitourinary Denies history of End Stage Renal Disease Integumentary (Skin) Denies history of History of Burn Oncologic Denies history of Received Chemotherapy, Received Radiation Psychiatric Denies history of Anorexia/bulimia, Confinement Anxiety Hospitalization/Surgery History - cervical fusion. - lumbar surgery. - coronary stent placement. - lasik eye surgery. - hydrocele excision. Medical A Surgical History Notes nd Constitutional Symptoms (General Health) obesity Ear/Nose/Mouth/Throat hard of hearing Respiratory ADEKUNLE, MEECE ([id number]) [id number] Page 11 of 15 frozen diaphragm right Cardiovascular hyperlipidemia Musculoskeletal myositis, lumbar DDD, spinal stenosis, cervical facet joint syndrome Objective Constitutional no acute distress. Vitals Time Taken: 12:49 PM, Height: 71 in, Weight: 267 lbs, BMI: 37.2, Temperature: 98.4 F, Pulse: 66 bpm, Respiratory Rate: 20 breaths/min, Blood Pressure: 113/67 mmHg. Respiratory Normal work of breathing on room air. General Notes: 01/07/2023: Overall continued improvement. Still with heavy slough buildup, but less drainage. Integumentary (Hair, Skin) Wound #3 status is Open. Original cause of wound was  Gradually Appeared. The date acquired was: 12/07/2021. The wound has been in treatment 47 weeks. The wound is located on the Right,Medial Lower Leg. The wound measures 12.3cm length x 7.4cm width x 0.4cm depth; 71.487cm^2 area and 28.595cm^3 volume. There is Fat Layer (Subcutaneous Tissue) exposed. There is no tunneling or undermining noted. There is a large amount of serous drainage noted. The wound margin is flat and intact. There is small (1-33%) red granulation within the wound bed. There is a large (67-100%) amount of necrotic tissue within the wound bed including Adherent Slough. The periwound skin appearance had no abnormalities noted for moisture. The periwound skin appearance exhibited: Excoriation, Hemosiderin Staining. Periwound temperature was noted as No Abnormality. The periwound has tenderness on palpation. Wound #6 status is Open. Original cause of wound was Gradually Appeared. The date acquired was: 05/12/2022. The wound has been in treatment 34 weeks. The wound is located on the Right,Anterior Lower Leg. The wound measures 2cm length x 0.4cm width x 0.6cm  depth; 0.628cm^2 area and 0.377cm^3 volume. There is Fat Layer (Subcutaneous Tissue) exposed. There is no tunneling or undermining noted. There is a medium amount of serous drainage noted. The wound margin is epibole. There is medium (34-66%) red granulation within the wound bed. There is a small (1-33%) amount of necrotic tissue within the wound bed including Adherent Slough. The periwound skin appearance had no abnormalities noted for texture. The periwound skin appearance had no abnormalities noted for moisture. The periwound skin appearance exhibited: Hemosiderin Staining. Periwound temperature was noted as No Abnormality. 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 There was a Selective/Open Wound Non-Viable Tissue Debridement with a total area of 53.59 sq cm performed by Duanne Guess, MD. With the following instrument(s): Curette to remove Non-Viable tissue/material. Material removed includes [facility] after achieving pain control using Lidocaine 4% Topical Solution. No specimens were taken. A time out was conducted at 13:10, prior to the start of the procedure. A Minimum amount of bleeding was controlled with Pressure. The procedure was tolerated well with a pain level of 4 throughout and a pain level of 2 following the procedure. Post Debridement Measurements: 12.3cm length x 7.4cm width x 0.4cm depth; 28.595cm^3 volume. Character of Wound/Ulcer Post Debridement is improved. Post procedure Diagnosis Wound #3: Same as Pre-Procedure General Notes: Scribed for Dr. [name] by Zenaida Deed, RN. Pre-procedure diagnosis of Wound #3 is a Lymphedema located on the Right,Medial Lower Leg . There was a Three Layer Compression Therapy Procedure by Zenaida Deed, RN. Post procedure Diagnosis Wound #3: Same as Pre-Procedure JAQUAVION, [name] ([id number]) [id number] Page 12 of 15 Plan Follow-up Appointments: Return Appointment in 1 week. - Dr. [name] RM 1 Friday 6/21 @ 12:30 pm Nurse Visit: - Tuesday RM 1 11:15 am Anesthetic: Wound #3 Right,Medial Lower Leg: (In clinic) Topical Lidocaine 4% applied to wound bed Wound #6 Right,Anterior Lower Leg: (In clinic) Topical Lidocaine 4% applied to wound bed Bathing/ Shower/ Hygiene: May shower with protection but do not get wound dressing(s) wet. Protect dressing(s) with water repellant cover (for  example, large plastic bag) or a cast cover and may then take shower. Edema Control - Lymphedema / SCD / Other: Lymphedema Pumps. Use Lymphedema pumps on leg(s) 2-3 times a day for 45-60 minutes. If wearing any wraps or hose, do not remove them. Continue exercising as instructed. Elevate legs to the level of the heart or above for 30 minutes daily and/or when sitting for 3-4 times a day throughout the day. - throughout the day Avoid standing for long periods of time. Compression stocking or Garment 20-30 mm/Hg pressure to: - juxtalite left leg daily WOUND #3: - Lower Leg Wound Laterality: Right, Medial Cleanser: Soap and Water 2 x Per Week/30 Days Discharge Instructions: May shower and wash wound with dial antibacterial soap and water prior to dressing change. Cleanser: Vashe 5.8 (oz) 2 x Per Week/30 Days Discharge Instructions: Cleanse the wound with Vashe prior to applying a clean dressing using gauze sponges, not tissue or cotton balls. Peri-Wound Care: Zinc Oxide Ointment 30g tube 2 x Per Week/30 Days Discharge Instructions: Apply Zinc Oxide to periwound with each dressing change Peri-Wound Care: Manuela Schwartz  Lotion (Moisturizing lotion) 2 x Per Week/30 Days Discharge Instructions: Apply moisturizing lotion as directed Topical: Gentamicin 2 x Per Week/30 Days Discharge Instructions: mixed 50:50 with mupirocin to wound bed Topical: Mupirocin Ointment 2 x Per Week/30 Days Discharge Instructions: Apply Mupirocin (Bactroban) as instructed Prim Dressing: UrgoClean AG 4 X 5 (in/in) 2 x Per Week/30 Days ary Discharge Instructions: Apply directly to wound bed. Secondary Dressing: ABD Pad, 8x10 2 x Per Week/30 Days Discharge Instructions: Apply over primary dressing as directed. Secondary Dressing: Zetuvit Plus 4x8 in 2 x Per Week/30 Days Discharge Instructions: Apply over primary dressing as directed. Com pression Wrap: ThreePress (3 layer compression wrap) 2 x Per Week/30 Days Discharge  Instructions: Apply three layer compression as directed. WOUND #6: - Lower Leg Wound Laterality: Right, Anterior Peri-Wound Care: Sween Lotion (Moisturizing lotion) 2 x Per Week/30 Days Discharge Instructions: Apply moisturizing lotion as directed Prim Dressing: Maxorb Extra Ag+ Alginate Dressing, 2x2 (in/in) 2 x Per Week/30 Days ary Discharge Instructions: Apply to wound bed as instructed Secondary Dressing: Woven Gauze Sponge, Non-Sterile 4x4 in 2 x Per Week/30 Days Discharge Instructions: Apply over primary dressing as directed. Com pression Wrap: ThreePress (3 layer compression wrap) 2 x Per Week/30 Days Discharge Instructions: Apply three layer compression as directed. 01/07/2023: Overall continued improvement. Still with heavy slough buildup, but less drainage. I used a curette to debride the slough from all of his wounds. We will continue topical gentamicin and mupirocin to all sites. He will continue his course of oral levofloxacin. We are going to use the Urgo clean dressing as his contact layer. Continue standard 3 layer compression. Follow-up for nurse visit midweek and see me again a week from today. Electronic Signature(s) Signed: 01/07/2023 1:39:25 PM By: Duanne Guess MD FACS Entered By: Duanne Guess on 01/07/2023 13:39:24 -------------------------------------------------------------------------------- HxROS Details Patient Name: Date of Service: JAIMEE, BOSHER [name] 01/07/2023 12:30 PM Medical Record Number: [id number] Patient Account Number: [id number] Date of Birth/Sex: Treating RN: [birth year] (72 y.o. M) Primary Care Provider: [name] Other Clinician: Referring Provider: Treating Provider/Extender: [name] in Treatment: [address] Information Obtained From [place] ([id number]) [id number] Page 13 of 15 Patient Chart Constitutional Symptoms (General Health) Medical History: Past Medical History  Notes: obesity Eyes Medical History: Positive for: Cataracts - right eye removed Negative for: Glaucoma; Optic Neuritis Ear/Nose/Mouth/Throat Medical History: Negative for: Chronic sinus problems/congestion; Middle ear problems Past Medical History Notes: hard of hearing Hematologic/Lymphatic Medical History: Positive for: Anemia - macrocytic; Lymphedema Respiratory Medical History: Past Medical History Notes: frozen diaphragm right Cardiovascular Medical History: Positive for: Congestive Heart Failure; Deep Vein Thrombosis - left leg; Hypertension; Peripheral Venous Disease Past Medical History Notes: hyperlipidemia Endocrine Medical History: Negative for: Type I Diabetes; Type II Diabetes Genitourinary Medical History: Negative for: End Stage Renal Disease Integumentary (Skin) Medical History: Negative for: History of Burn Musculoskeletal Medical History: Past Medical History Notes: myositis, lumbar DDD, spinal stenosis, cervical facet joint syndrome Oncologic Medical History: Negative for: Received Chemotherapy; Received Radiation Psychiatric Medical History: Negative for: Anorexia/bulimia; Confinement Anxiety HBO Extended History Items Eyes: Cataracts Immunizations Pneumococcal Vaccine: Received Pneumococcal Vaccination: Yes Received Pneumococcal Vaccination On or After [address]SIDAK, [name] ([id number]) [id number] Page 14 of 15 Implantable Devices No devices added Hospitalization / Surgery History Type of Hospitalization/Surgery cervical fusion lumbar surgery coronary stent placement lasik eye surgery hydrocele excision Family and Social History Cancer: Yes - Mother; Diabetes: No; Heart Disease: Yes - Father,Mother; Hereditary Spherocytosis: No; Hypertension: Yes - Father,Siblings; Kidney Disease:  No; Lung Disease: Yes - Siblings; Seizures: No; Stroke: No; Thyroid Problems: No; Tuberculosis: No; Former smoker -  quit 1991; Marital Status - Married; Alcohol Use: Moderate; Drug Use: No History; Caffeine Use: Daily - coffee; Financial Concerns: No; Food, Clothing or Shelter Needs: No; Support System Lacking: No; Transportation Concerns: No Electronic Signature(s) Signed: 01/07/2023 1:58:04 PM By: Duanne Guess MD FACS Entered By: Duanne Guess on 01/07/2023 13:38:15 -------------------------------------------------------------------------------- SuperBill Details Patient Name: Date of Service: JERMARCUS, GRAUBERGER 01/07/2023 Medical Record Number: [id number] Patient Account Number: [id number] Date of Birth/Sex: Treating RN: 11/12/1950 (72 y.o. M) Primary Care Provider: [name] Other Clinician: Referring Provider: Treating Provider/Extender: [name] in Treatment: 47 Diagnosis Coding ICD-10 Codes Code Description [phone] 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) I10 Essential (primary) hypertension I25.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 angina pectoris I89.0 Lymphedema, not elsewhere classified Z86.718 Personal history of other venous thrombosis and embolism Facility Procedures : CPT4 Code: [id number] Description: 97597 - DEBRIDE WOUND 1ST 20 SQ CM OR < ICD-10 Diagnosis Description 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 Modifier: d Quantity: 1 : CPT4 Code: [id number] Description: 97598 - DEBRIDE WOUND EA ADDL 20 SQ CM ICD-10 Diagnosis Description 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 Modifier: d Quantity: 2 Physician Procedures : CPT4 Code Description Modifier [id number] 99214 - WC PHYS LEVEL 4 - EST PT 25 ICD-10 Diagnosis Description L97.812 Non-pressure chronic ulcer of other part of right lower leg with fat layer  exposed 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 Venous insufficiency  (chronic) (peripheral) I89.0 Lymphedema, not elsewhere classified ANZIO, OREJEL ([id number]) [id number] 97597 - WC PHYS DEBR WO ANESTH 20 SQ CM 1 ICD-10 Diagnosis Description L97.812 Non-pressure chronic ulcer of other part  of right lower leg with fat layer exposed Quantity: 1 .pdf Page 15 of 15 : [id number] 97598 - WC PHYS DEBR WO ANESTH EA ADD 20 CM 2 ICD-10 Diagnosis Description 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ed Quantity: Electronic Signature(s) Signed: 01/07/2023 1:39:47 PM By: Duanne Guess MD FACS Entered By: Duanne Guess on 01/07/2023 13:39:47

## 2023-01-11 ENCOUNTER — Encounter (HOSPITAL_BASED_OUTPATIENT_CLINIC_OR_DEPARTMENT_OTHER): Payer: Medicare Other | Admitting: General Surgery

## 2023-01-11 DIAGNOSIS — L97812 Non-pressure chronic ulcer of other part of right lower leg with fat layer exposed: Secondary | ICD-10-CM | POA: Diagnosis not present

## 2023-01-11 DIAGNOSIS — L942 Calcinosis cutis: Secondary | ICD-10-CM | POA: Diagnosis not present

## 2023-01-11 DIAGNOSIS — I509 Heart failure, unspecified: Secondary | ICD-10-CM | POA: Diagnosis not present

## 2023-01-11 DIAGNOSIS — I2510 Atherosclerotic heart disease of native coronary artery without angina pectoris: Secondary | ICD-10-CM | POA: Diagnosis not present

## 2023-01-11 DIAGNOSIS — I872 Venous insufficiency (chronic) (peripheral): Secondary | ICD-10-CM | POA: Diagnosis not present

## 2023-01-11 DIAGNOSIS — I110 Hypertensive heart disease with heart failure: Secondary | ICD-10-CM | POA: Diagnosis not present

## 2023-01-11 NOTE — Progress Notes (Signed)
[name], [name] ([id number]) [id number] Page 1 of 1 Visit Report for 01/11/2023 SuperBill Details Patient Name: Date of Service: [name], [name] 01/11/2023 Medical Record Number: [id number] Patient Account Number: [id number] Date of Birth/Sex: Treating RN: 1950-11-30 (72 y.o. [name] Primary Care Provider: [name] Other Clinician: Referring Provider: Treating Provider/Extender: [name] in Treatment: 48 Diagnosis Coding ICD-10 Codes Code Description [phone] 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) I10 Essential (primary) hypertension I25.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 angina pectoris I89.0 Lymphedema, not elsewhere classified Z86.718 Personal history of other venous thrombosis and embolism Facility Procedures CPT4 Code Description Modifier Quantity [phone] (Facility Use Only) [phone] - APPLY MULTLAY COMPRS LWR RT LEG 1 Electronic Signature(s) Signed: 01/11/2023 12:19:28 PM By: Duanne Guess MD FACS Signed: 01/11/2023 4:43:15 PM By: Zenaida Deed RN, BSN Entered By: Zenaida Deed on 01/11/2023 12:05:19

## 2023-01-11 NOTE — Progress Notes (Signed)
FOUNTAIN, LICEAGA ([id number]) [id number] Page 1 of 5 Visit Report for 01/11/2023 Arrival Information Details Patient Name: Date of Service: [name], [name] 01/11/2023 11:15 A M Medical Record Number: [id number] Patient Account Number: [id number] Date of Birth/Sex: Treating RN: 08-30-1950 (72 y.o. [name] Primary Care Braiden Presutti: [name] Other Clinician: Referring Oniyah Rohe: Treating Romen Yutzy/Extender: [name] in Treatment: 48 Visit Information History Since Last Visit Added or deleted any medications: No Patient Arrived: Wheel Chair Any new allergies or adverse reactions: No Arrival Time: 11:20 Had a fall or experienced change in No Accompanied By: self activities of daily living that may affect Transfer Assistance: None risk of falls: Patient Identification Verified: Yes Signs or symptoms of abuse/neglect since last visito No Secondary Verification Process Completed: Yes Hospitalized since last visit: No Patient Requires Transmission-Based Precautions: No Implantable device outside of the clinic excluding No Patient Has Alerts: Yes cellular tissue based products placed in the center Patient Alerts: R ABI= N/C, TBI= .75 since last visit: L ABI =N/C, TBI = .57 Has Dressing in Place as Prescribed: Yes Has Compression in Place as Prescribed: Yes Pain Present Now: No Electronic Signature(s) Signed: 01/11/2023 4:43:15 PM By: Zenaida Deed RN, BSN Entered By: Zenaida Deed on 01/11/2023 12:02:29 -------------------------------------------------------------------------------- Compression Therapy Details Patient Name: Date of Service: [name] 01/11/2023 11:15 A M Medical Record Number: [id number] Patient Account Number: [id number] Date of Birth/Sex: Treating RN: February 08, 1951 (72 y.o. [name] Primary Care Dsean Vantol: [name] Other Clinician: Referring Siearra Amberg: Treating Afton Lavalle/Extender: [name] in Treatment: 48 Compression Therapy Performed for Wound Assessment: Wound #3 Right,Medial Lower Leg Performed By: Clinician Zenaida Deed, RN Compression Type: Three Layer Electronic Signature(s) Signed: 01/11/2023 4:43:15 PM By: Zenaida Deed RN, BSN Entered By: Zenaida Deed on 01/11/2023 12:04:06 -------------------------------------------------------------------------------- Encounter Discharge Information Details Patient Name: Date of Service: [name] 01/11/2023 11:15 A M Medical Record Number: [id number] Patient Account Number: [id number] [name] ([id number]) [id number] Page 2 of 5 Date of Birth/Sex: Treating RN: 02/24/1951 (72 y.o. [name] Primary Care Donnalee Cellucci: Other Clinician: [name] Referring Abubakr Wieman: Treating Kaprice Kage/Extender: [name] in Treatment: 42 Encounter Discharge Information Items Discharge Condition: Stable Ambulatory Status: Wheelchair Discharge Destination: Home Transportation: Private Auto Accompanied By: self Schedule Follow-up Appointment: Yes Clinical Summary of Care: Patient Declined Electronic Signature(s) Signed: 01/11/2023 4:43:15 PM By: Zenaida Deed RN, BSN Entered By: Zenaida Deed on 01/11/2023 12:05:06 -------------------------------------------------------------------------------- Patient/Caregiver Education Details Patient Name: Date of Service: [name] 6/18/2024andnbsp11:15 A M Medical Record Number: [id number] Patient Account Number: [id number] Date of Birth/Gender: Treating RN: 1951/07/22 (72 y.o. [name] Primary Care Physician: [name] Other Clinician: Referring Physician: Treating Physician/Extender: [name] in Treatment: 23 Education Assessment Education Provided To: Patient Education Topics Provided Infection: Methods: Explain/Verbal Responses:  Reinforcements needed, State content correctly Venous: Methods: Explain/Verbal Responses: Reinforcements needed, State content correctly Electronic Signature(s) Signed: 01/11/2023 4:43:15 PM By: Zenaida Deed RN, BSN Entered By: Zenaida Deed on 01/11/2023 12:04:52 -------------------------------------------------------------------------------- Wound Assessment Details Patient Name: Date of Service: SHRI, ENFINGER 01/11/2023 11:15 A M Medical Record Number: [id number] Patient Account Number: [id number] Date of Birth/Sex: Treating RN: [birth year] (72 y.o. [name] Primary Care Hansen Carino: [name] Other Clinician: Referring Riata Ikeda: Treating Kissie Ziolkowski/Extender: [name] in Treatment: [address] [name] ([id number]) [id number] Page 3 of 5 Wound Number: 3 Primary Lymphedema Etiology: Wound Location: Right, Medial Lower Leg Wound Open Wounding Event: Gradually Appeared  Status: Date Acquired: 12/07/2021 Comorbid Cataracts, Anemia, Lymphedema, Congestive Heart Failure, Deep Weeks Of Treatment: 48 History: Vein Thrombosis, Hypertension, Peripheral Venous Disease Clustered Wound: No Wound Measurements Length: (cm) 12.3 Width: (cm) 7.4 Depth: (cm) 0.4 Area: (cm) 71.487 Volume: (cm) 28.595 % Reduction in Area: -1238.5% % Reduction in Volume: -1238.7% Epithelialization: Small (1-33%) Tunneling: No Undermining: No Wound Description Classification: Full Thickness With Exposed Suppo Wound Margin: Flat and Intact Exudate Amount: Large Exudate Type: Serous Exudate Color: amber rt Structures Foul Odor After Cleansing: No Slough/Fibrino Yes Wound Bed Granulation Amount: Medium (34-66%) Exposed Structure Granulation Quality: Red Fascia Exposed: No Necrotic Amount: Medium (34-66%) Fat Layer (Subcutaneous Tissue) Exposed: Yes Necrotic Quality: Adherent Slough Tendon Exposed: No Muscle Exposed:  No Joint Exposed: No Bone Exposed: No Periwound Skin Texture Texture Color No Abnormalities Noted: Yes No Abnormalities Noted: No Hemosiderin Staining: Yes Moisture No Abnormalities Noted: Yes Temperature / Pain Temperature: No Abnormality Tenderness on Palpation: Yes Treatment Notes Wound #3 (Lower Leg) Wound Laterality: Right, Medial Cleanser Soap and Water Discharge Instruction: May shower and wash wound with dial antibacterial soap and water prior to dressing change. Vashe 5.8 (oz) Discharge Instruction: Cleanse the wound with Vashe prior to applying a clean dressing using gauze sponges, not tissue or cotton balls. Peri-Wound Care Zinc Oxide Ointment 30g tube Discharge Instruction: Apply Zinc Oxide to periwound with each dressing change Sween Lotion (Moisturizing lotion) Discharge Instruction: Apply moisturizing lotion as directed Topical Gentamicin Discharge Instruction: mixed 50:50 with mupirocin to wound bed Mupirocin Ointment Discharge Instruction: Apply Mupirocin (Bactroban) as instructed Primary Dressing UrgoClean AG 4 X 5 (in/in) Discharge Instruction: Apply directly to wound bed. Secondary Dressing ABD Pad, 8x10 Discharge Instruction: Apply over primary dressing as directed. Zetuvit Plus 4x8 in Discharge Instruction: Apply over primary dressing as directed. Secured With DIEM, STINTON ([id number]) [id number] Page 4 of 5 Compression Wrap ThreePress (3 layer compression wrap) Discharge Instruction: Apply three layer compression as directed. Compression Stockings Add-Ons Electronic Signature(s) Signed: 01/11/2023 4:43:15 PM By: Zenaida Deed RN, BSN Entered By: Zenaida Deed on 01/11/2023 12:03:30 -------------------------------------------------------------------------------- Wound Assessment Details Patient Name: Date of Service: LYNDLE, VIRGEN [name] 01/11/2023 11:15 A M Medical Record Number: [id number] Patient Account Number:  [id number] Date of Birth/Sex: Treating RN: 05/20/1951 (72 y.o. [name] Primary Care Hibah Odonnell: [name] Other Clinician: Referring Osinachi Navarrette: Treating Belinda Schlichting/Extender: [name] in Treatment: 48 Wound Status Wound Number: 6 Primary Calcinosis Etiology: Wound Location: Right, Anterior Lower Leg Wound Open Wounding Event: Gradually Appeared Status: Date Acquired: 05/12/2022 Comorbid Cataracts, Anemia, Lymphedema, Congestive Heart Failure, Deep Weeks Of Treatment: 34 History: Vein Thrombosis, Hypertension, Peripheral Venous Disease Clustered Wound: Yes Wound Measurements Length: (cm) Width: (cm) Depth: (cm) Clustered Quantity: Area: (cm) Volume: (cm) 2.7 % Reduction in Area: -259.3% 0.4 % Reduction in Volume: -2020.8% 0.6 Epithelialization: Small (1-33%) 2 Tunneling: No 0.848 Undermining: No 0.509 Wound Description Classification: Full Thickness Without Exposed Sup Wound Margin: Epibole Exudate Amount: Medium Exudate Type: Serosanguineous Exudate Color: red, brown port Structures Foul Odor After Cleansing: No Slough/Fibrino Yes Wound Bed Granulation Amount: Medium (34-66%) Exposed Structure Granulation Quality: Red Fascia Exposed: No Necrotic Amount: Small (1-33%) Fat Layer (Subcutaneous Tissue) Exposed: Yes Necrotic Quality: Adherent Slough Tendon Exposed: No Muscle Exposed: No Joint Exposed: No Bone Exposed: No Periwound Skin Texture Texture Color No Abnormalities Noted: Yes No Abnormalities Noted: No Hemosiderin Staining: Yes Moisture No Abnormalities Noted: Yes Temperature / Pain Temperature: No Abnormality Treatment Notes Wound #6 (Lower Leg) Wound Laterality: Right, Anterior Cleanser Read,  [name] ([id number]) [id number] Page 5 of 5 Peri-Wound Care Sween Lotion (Moisturizing lotion) Discharge Instruction: Apply moisturizing lotion as directed Topical Primary Dressing Maxorb  Extra Ag+ Alginate Dressing, 2x2 (in/in) Discharge Instruction: Apply to wound bed as instructed Secondary Dressing Woven Gauze Sponge, Non-Sterile 4x4 in Discharge Instruction: Apply over primary dressing as directed. Secured With Compression Wrap ThreePress (3 layer compression wrap) Discharge Instruction: Apply three layer compression as directed. Compression Stockings Add-Ons Electronic Signature(s) Signed: 01/11/2023 4:43:15 PM By: Zenaida Deed RN, BSN Entered By: Zenaida Deed on 01/11/2023 12:03:50

## 2023-01-14 ENCOUNTER — Encounter (HOSPITAL_BASED_OUTPATIENT_CLINIC_OR_DEPARTMENT_OTHER): Payer: Medicare Other | Admitting: General Surgery

## 2023-01-14 DIAGNOSIS — L942 Calcinosis cutis: Secondary | ICD-10-CM | POA: Diagnosis not present

## 2023-01-14 DIAGNOSIS — I872 Venous insufficiency (chronic) (peripheral): Secondary | ICD-10-CM | POA: Diagnosis not present

## 2023-01-14 DIAGNOSIS — I2510 Atherosclerotic heart disease of native coronary artery without angina pectoris: Secondary | ICD-10-CM | POA: Diagnosis not present

## 2023-01-14 DIAGNOSIS — L97812 Non-pressure chronic ulcer of other part of right lower leg with fat layer exposed: Secondary | ICD-10-CM | POA: Diagnosis not present

## 2023-01-14 DIAGNOSIS — I110 Hypertensive heart disease with heart failure: Secondary | ICD-10-CM | POA: Diagnosis not present

## 2023-01-14 DIAGNOSIS — I509 Heart failure, unspecified: Secondary | ICD-10-CM | POA: Diagnosis not present

## 2023-01-14 DIAGNOSIS — I890 Lymphedema, not elsewhere classified: Secondary | ICD-10-CM | POA: Diagnosis not present

## 2023-01-14 NOTE — Progress Notes (Signed)
[name], [name] ([id number]) [id number] Page 1 of 9 Visit Report for 01/14/2023 Arrival Information Details Patient Name: Date of Service: [name], [name] 01/14/2023 12:30 PM Medical Record Number: [id number] Patient Account Number: [id number] Date of Birth/Sex: Treating RN: [birth year] (72 y.o. [name] Primary Care Lida Berkery: [name] Other Clinician: Referring Quinlan Mcfall: Treating Yanisa Goodgame/Extender: [name] in Treatment: 48 Visit Information History Since Last Visit Added or deleted any medications: No Patient Arrived: Wheel Chair Any new allergies or adverse reactions: No Arrival Time: 12:42 Had a fall or experienced change in No Accompanied By: self activities of daily living that may affect Transfer Assistance: None risk of falls: Patient Identification Verified: Yes Signs or symptoms of abuse/neglect since last visito No Secondary Verification Process Completed: Yes Hospitalized since last visit: No Patient Requires Transmission-Based Precautions: No Implantable device outside of the clinic excluding No Patient Has Alerts: Yes cellular tissue based products placed in the center Patient Alerts: R ABI= N/C, TBI= .75 since last visit: L ABI =N/C, TBI = .57 Has Dressing in Place as Prescribed: Yes Has Compression in Place as Prescribed: Yes Pain Present Now: No Electronic Signature(s) Signed: 01/14/2023 5:01:45 PM By: Zenaida Deed RN, BSN Entered By: Zenaida Deed on 01/14/2023 12:43:22 -------------------------------------------------------------------------------- Compression Therapy Details Patient Name: Date of Service: [name], [name] 01/14/2023 12:30 PM Medical Record Number: [id number] Patient Account Number: [id number] Date of Birth/Sex: Treating RN: 25-Jun-1951 (72 y.o. [name] Primary Care Lynden Carrithers: [name] Other Clinician: Referring Kaitrin Seybold: Treating Rehan Holness/Extender: [name] in Treatment: 48 Compression Therapy Performed for Wound Assessment: Wound #3 Right,Medial Lower Leg Performed By: Clinician Zenaida Deed, RN Compression Type: Three Layer Post Procedure Diagnosis Same as Pre-procedure Electronic Signature(s) Signed: 01/14/2023 5:01:45 PM By: Zenaida Deed RN, BSN Entered By: Zenaida Deed on 01/14/2023 13:07:08 [name] ([id number]) [id number] Page 2 of 9 -------------------------------------------------------------------------------- Encounter Discharge Information Details Patient Name: Date of Service: [name], [name] 01/14/2023 12:30 PM Medical Record Number: [id number] Patient Account Number: [id number] Date of Birth/Sex: Treating RN: 03-26-1951 (72 y.o. [name] Primary Care Mareta Chesnut: [name] Other Clinician: Referring Jakai Onofre: Treating Beauty Pless/Extender: [name] in Treatment: 40 Encounter Discharge Information Items Post Procedure Vitals Discharge Condition: Stable Temperature (F): 98.4 Ambulatory Status: Wheelchair Pulse (bpm): 60 Discharge Destination: Home Respiratory Rate (breaths/min): 20 Transportation: Private Auto Blood Pressure (mmHg): 134/73 Accompanied By: self Schedule Follow-up Appointment: Yes Clinical Summary of Care: Patient Declined Electronic Signature(s) Signed: 01/14/2023 5:01:45 PM By: Zenaida Deed RN, BSN Entered By: Zenaida Deed on 01/14/2023 13:41:08 -------------------------------------------------------------------------------- Lower Extremity Assessment Details Patient Name: Date of Service: [name], [name] 01/14/2023 12:30 PM Medical Record Number: [id number] Patient Account Number: [id number] Date of Birth/Sex: Treating RN: [birth year] (72 y.o. [name] Primary Care Nikoloz Huy: [name] Other Clinician: Referring Kenneisha Cochrane: Treating Evaluna Utke/Extender: [name] in Treatment: 48 Edema Assessment Assessed: Kyra Searles: No] Franne Forts: No] Edema: [Left: Ye] [Right: s] Calf Left: Right: Point of Measurement: From Medial Instep 42 cm Ankle Left: Right: Point of Measurement: From Medial Instep 27.5 cm Vascular Assessment Pulses: Dorsalis Pedis Palpable: [Right:Yes] Electronic Signature(s) Signed: 01/14/2023 5:01:45 PM By: Zenaida Deed RN, BSN Entered By: Zenaida Deed on 01/14/2023 12:51:46 Multi Wound Chart Details -------------------------------------------------------------------------------- [name] ([id number]) [id number] Page 3 of 9 Patient Name: Date of Service: [name], [name] 01/14/2023 12:30 PM Medical Record Number: [id number] Patient Account Number: [id number] Date of Birth/Sex: Treating RN: 1951-07-11 (72 y.o. M) Primary Care Shakinah Navis: [name] Other  Clinician: Referring Tatum Massman: Treating Tomasz Steeves/Extender: [name] in Treatment: 48 Vital Signs Height(in): 71 Pulse(bpm): 64 Weight(lbs): 267 Blood Pressure(mmHg): 134/73 Body Mass Index(BMI): 37.2 Temperature(F): 98.4 Respiratory Rate(breaths/min): 20 Wound Assessments Wound Number: 3 6 N/A Photos: No Photos N/A Right, Medial Lower Leg Right, Anterior Lower Leg N/A Wound Location: Gradually Appeared Gradually Appeared N/A Wounding Event: Lymphedema Calcinosis N/A Primary Etiology: Cataracts, Anemia, Lymphedema, Cataracts, Anemia, Lymphedema, N/A Comorbid History: Congestive Heart Failure, Deep Vein Congestive Heart Failure, Deep Vein Thrombosis, Hypertension, Peripheral Thrombosis, Hypertension, Peripheral Venous Disease Venous Disease 12/07/2021 05/12/2022 N/A Date Acquired: 48 35 N/A Weeks of Treatment: Open Open N/A Wound Status: No No N/A Wound Recurrence: No Yes N/A Clustered Wound: N/A 2 N/A Clustered Quantity: 9.4x7.5x0.4 1.5x0.4x0.7 N/A Measurements L x W x D (cm) 55.371  0.471 N/A A (cm) : rea 22.148 0.33 N/A Volume (cm) : -936.70% -99.60% N/A % Reduction in A rea: -936.90% -1275.00% N/A % Reduction in Volume: Full Thickness With Exposed Support Full Thickness Without Exposed N/A Classification: Structures Support Structures Large Medium N/A Exudate A mount: Serous Serosanguineous N/A Exudate Type: amber red, brown N/A Exudate Color: Flat and Intact Epibole N/A Wound Margin: Medium (34-66%) Medium (34-66%) N/A Granulation A mount: Red Red N/A Granulation Quality: Medium (34-66%) Small (1-33%) N/A Necrotic A mount: Fat Layer (Subcutaneous Tissue): Yes Fat Layer (Subcutaneous Tissue): Yes N/A Exposed Structures: Fascia: No Fascia: No Tendon: No Tendon: No Muscle: No Muscle: No Joint: No Joint: No Bone: No Bone: No Small (1-33%) Small (1-33%) N/A Epithelialization: Debridement - Excisional N/A N/A Debridement: Pre-procedure Verification/Time Out 13:10 N/A N/A Taken: Lidocaine 4% Topical Solution N/A N/A Pain Control: Subcutaneous, Slough N/A N/A Tissue Debrided: Skin/Subcutaneous Tissue N/A N/A Level: 38.74 N/A N/A Debridement A (sq cm): rea Curette N/A N/A Instrument: Minimum N/A N/A Bleeding: Pressure N/A N/A Hemostasis A chieved: 4 N/A N/A Procedural Pain: 3 N/A N/A Post Procedural Pain: Procedure was tolerated well N/A N/A Debridement Treatment Response: 9.4x7.5x0.4 N/A N/A Post Debridement Measurements L x W x D (cm) 22.148 N/A N/A Post Debridement Volume: (cm) Excoriation: Yes No Abnormalities Noted N/A Periwound Skin Texture: No Abnormalities Noted No Abnormalities Noted N/A Periwound Skin Moisture: Hemosiderin Staining: Yes Hemosiderin Staining: Yes N/A Periwound Skin Color: No Abnormality No Abnormality N/A Temperature: Yes N/A N/A Tenderness on Palpation: N/A probes to calcium deposits N/A Assessment NotesSARVESH, [name] ([id number]) [id number] Page 4 of 9 Compression  Therapy N/A N/A Procedures Performed: Debridement Treatment Notes Electronic Signature(s) Signed: 01/14/2023 1:25:10 PM By: Duanne Guess MD FACS Entered By: Duanne Guess on 01/14/2023 13:25:09 -------------------------------------------------------------------------------- Multi-Disciplinary Care Plan Details Patient Name: Date of Service: [name], [name] 01/14/2023 12:30 PM Medical Record Number: [id number] Patient Account Number: [id number] Date of Birth/Sex: Treating RN: 1951/03/29 (72 y.o. [name] Primary Care Elliotte Marsalis: [name] Other Clinician: Referring Breezy Hertenstein: Treating Shigeo Baugh/Extender: [name] in Treatment: 54 Multidisciplinary Care Plan reviewed with physician Active Inactive Venous Leg Ulcer Nursing Diagnoses: Knowledge deficit related to disease process and management Potential for venous Insuffiency (use before diagnosis confirmed) Goals: Patient will maintain optimal edema control Date Initiated: 02/17/2022 Target Resolution Date: 02/23/2023 Goal Status: Active Interventions: Assess peripheral edema status every visit. Compression as ordered Provide education on venous insufficiency Treatment Activities: Non-invasive vascular studies : 02/17/2022 Therapeutic compression applied : 02/17/2022 Notes: Wound/Skin Impairment Nursing Diagnoses: Impaired tissue integrity Knowledge deficit related to ulceration/compromised skin integrity Goals: Patient/caregiver will verbalize understanding of skin care regimen Date Initiated: 02/17/2022 Target Resolution Date: 02/23/2023  Goal Status: Active Ulcer/skin breakdown will have a volume reduction of 30% by week 4 Date Initiated: 02/17/2022 Date Inactivated: 03/10/2022 Target Resolution Date: 03/10/2022 Unmet Reason: infection being treated, Goal Status: Unmet waiting on derm appte Ulcer/skin breakdown will have a volume reduction of 50% by week 8 Date Initiated:  03/10/2022 Date Inactivated: 04/14/2022 Target Resolution Date: 04/07/2022 Goal Status: Unmet Unmet Reason: calcinosis Ulcer/skin breakdown will have a volume reduction of 80% by week 12 Date Initiated: 04/14/2022 Date Inactivated: 05/05/2022 Target Resolution Date: 05/05/2022 Unmet Reason: infection, chronic Goal Status: Unmet condition [name], [name] ([id number]) [id number] Page 5 of 9 Interventions: Assess patient/caregiver ability to obtain necessary supplies Assess patient/caregiver ability to perform ulcer/skin care regimen upon admission and as needed Assess ulceration(s) every visit Provide education on ulcer and skin care Treatment Activities: Skin care regimen initiated : 02/17/2022 Topical wound management initiated : 02/17/2022 Notes: Electronic Signature(s) Signed: 01/14/2023 5:01:45 PM By: Zenaida Deed RN, BSN Entered By: Zenaida Deed on 01/14/2023 13:02:50 -------------------------------------------------------------------------------- Pain Assessment Details Patient Name: Date of Service: [name], [name] 01/14/2023 12:30 PM Medical Record Number: [id number] Patient Account Number: [id number] Date of Birth/Sex: Treating RN: September 04, 1950 (72 y.o. [name] Primary Care Codi Kertz: [name] Other Clinician: Referring Lanyiah Brix: Treating Mel Tadros/Extender: [name] in Treatment: 48 Active Problems Location of Pain Severity and Description of Pain Patient Has Paino No Site Locations Rate the pain. Current Pain Level: 0 Pain Management and Medication Current Pain Management: Electronic Signature(s) Signed: 01/14/2023 5:01:45 PM By: Zenaida Deed RN, BSN Entered By: Zenaida Deed on 01/14/2023 12:48:24 Patient/Caregiver Education Details -------------------------------------------------------------------------------- [name] ([id number]) [id number] Page 6 of  9 Patient Name: Date of Service: [name], [name] 6/21/2024andnbsp12:30 PM Medical Record Number: [id number] Patient Account Number: [id number] Date of Birth/Gender: Treating RN: 1951-04-14 (72 y.o. [name] Primary Care Physician: [name] Other Clinician: Referring Physician: Treating Physician/Extender: [name] in Treatment: 29 Education Assessment Education Provided To: Patient Education Topics Provided Infection: Methods: Explain/Verbal Responses: Reinforcements needed, State content correctly Venous: Methods: Explain/Verbal Responses: Reinforcements needed, State content correctly Wound/Skin Impairment: Methods: Explain/Verbal Responses: Reinforcements needed, State content correctly Electronic Signature(s) Signed: 01/14/2023 5:01:45 PM By: Zenaida Deed RN, BSN Entered By: Zenaida Deed on 01/14/2023 13:03:30 -------------------------------------------------------------------------------- Wound Assessment Details Patient Name: Date of Service: [name], [name] 01/14/2023 12:30 PM Medical Record Number: [id number] Patient Account Number: [id number] Date of Birth/Sex: Treating RN: 1951-06-25 (72 y.o. [name] Primary Care Lenardo Westwood: [name] Other Clinician: Referring Malkia Nippert: Treating Elner Seifert/Extender: [name] in Treatment: 48 Wound Status Wound Number: 3 Primary Lymphedema Etiology: Wound Location: Right, Medial Lower Leg Wound Open Wounding Event: Gradually Appeared Status: Date Acquired: 12/07/2021 Comorbid Cataracts, Anemia, Lymphedema, Congestive Heart Failure, Deep Weeks Of Treatment: 48 History: Vein Thrombosis, Hypertension, Peripheral Venous Disease Clustered Wound: No Photos Wound Measurements Length: (cm) 9.4 Width: (cm) 7.5 [name], [name] ([id number]) Depth: (cm) 0.4 Area: (cm) 55 Volume: (cm) 22 % Reduction in Area: -936.7% % Reduction in Volume:  -936.9% [id number] Page 7 of 9 Epithelialization: Small (1-33%) .371 Tunneling: No .148 Undermining: No Wound Description Classification: Full Thickness With Exposed Support Structures Wound Margin: Flat and Intact Exudate Amount: Large Exudate Type: Serous Exudate Color: amber Foul Odor After Cleansing: No Slough/Fibrino Yes Wound Bed Granulation Amount: Medium (34-66%) Exposed Structure Granulation Quality: Red Fascia Exposed: No Necrotic Amount: Medium (34-66%) Fat Layer (Subcutaneous Tissue) Exposed: Yes Necrotic Quality: Adherent Slough Tendon Exposed: No Muscle Exposed: No Joint Exposed: No Bone Exposed:  No Periwound Skin Texture Texture Color No Abnormalities Noted: No No Abnormalities Noted: No Excoriation: Yes Hemosiderin Staining: Yes Moisture Temperature / Pain No Abnormalities Noted: Yes Temperature: No Abnormality Tenderness on Palpation: Yes Treatment Notes Wound #3 (Lower Leg) Wound Laterality: Right, Medial Cleanser Soap and Water Discharge Instruction: May shower and wash wound with dial antibacterial soap and water prior to dressing change. Vashe 5.8 (oz) Discharge Instruction: Cleanse the wound with Vashe prior to applying a clean dressing using gauze sponges, not tissue or cotton balls. Peri-Wound Care Ketoconazole Cream 2% Discharge Instruction: Apply Ketoconazole mixed with zinc to irritated periwound skin Zinc Oxide Ointment 30g tube Discharge Instruction: Apply Zinc Oxide to periwound with each dressing change Sween Lotion (Moisturizing lotion) Discharge Instruction: Apply moisturizing lotion as directed Topical Gentamicin Discharge Instruction: mixed 50:50 with mupirocin to wound bed Mupirocin Ointment Discharge Instruction: Apply Mupirocin (Bactroban) as instructed Primary Dressing Hydrofera Blue Ready Transfer Foam, 4x5 (in/in) Discharge Instruction: Apply to wound bed if urgo clean unavailable UrgoClean AG  4 X 5 (in/in) Discharge Instruction: Apply directly to wound bed. Secondary Dressing ABD Pad, 8x10 Discharge Instruction: Apply over primary dressing as directed. Zetuvit Plus 4x8 in Discharge Instruction: Apply over primary dressing as directed. Secured With Compression Wrap ThreePress (3 layer compression wrap) Discharge Instruction: Apply three layer compression as directed. Compression Stockings JAHEL, WAVRA ([id number]) [id number] Page 8 of 9 Add-Ons Electronic Signature(s) Signed: 01/14/2023 5:01:45 PM By: Zenaida Deed RN, BSN Entered By: Zenaida Deed on 01/14/2023 13:01:59 -------------------------------------------------------------------------------- Wound Assessment Details Patient Name: Date of Service: BRISTON, LAX 01/14/2023 12:30 PM Medical Record Number: [id number] Patient Account Number: [id number] Date of Birth/Sex: Treating RN: Nov 18, 1950 (72 y.o. [name] Primary Care Jazmin Ley: [name] Other Clinician: Referring Manley Fason: Treating Ceaser Ebeling/Extender: [name] in Treatment: 48 Wound Status Wound Number: 6 Primary Calcinosis Etiology: Wound Location: Right, Anterior Lower Leg Wound Open Wounding Event: Gradually Appeared Status: Date Acquired: 05/12/2022 Comorbid Cataracts, Anemia, Lymphedema, Congestive Heart Failure, Deep Weeks Of Treatment: 35 History: Vein Thrombosis, Hypertension, Peripheral Venous Disease Clustered Wound: Yes Wound Measurements Length: (cm) Width: (cm) Depth: (cm) Clustered Quantity: Area: (cm) Volume: (cm) 1.5 % Reduction in Area: -99.6% 0.4 % Reduction in Volume: -1275% 0.7 Epithelialization: Small (1-33%) 2 Tunneling: No 0.471 Undermining: No 0.33 Wound Description Classification: Full Thickness Without Exposed Sup Wound Margin: Epibole Exudate Amount: Medium Exudate Type: Serosanguineous Exudate Color: red, brown port Structures Foul Odor  After Cleansing: No Slough/Fibrino Yes Wound Bed Granulation Amount: Medium (34-66%) Exposed Structure Granulation Quality: Red Fascia Exposed: No Necrotic Amount: Small (1-33%) Fat Layer (Subcutaneous Tissue) Exposed: Yes Necrotic Quality: Adherent Slough Tendon Exposed: No Muscle Exposed: No Joint Exposed: No Bone Exposed: No Periwound Skin Texture Texture Color No Abnormalities Noted: Yes No Abnormalities Noted: No Hemosiderin Staining: Yes Moisture No Abnormalities Noted: Yes Temperature / Pain Temperature: No Abnormality Assessment Notes probes to calcium deposits Treatment Notes Wound #6 (Lower Leg) Wound Laterality: Right, Anterior Cleanser Peri-Wound Care Sween Lotion (Moisturizing lotion) Discharge Instruction: Apply moisturizing lotion as directed ZAFAR, DEBROSSE ([id number]) [id number] Page 9 of 9 Topical Primary Dressing Maxorb Extra Ag+ Alginate Dressing, 2x2 (in/in) Discharge Instruction: Apply to wound bed as instructed Secondary Dressing Woven Gauze Sponge, Non-Sterile 4x4 in Discharge Instruction: Apply over primary dressing as directed. Secured With Compression Wrap ThreePress (3 layer compression wrap) Discharge Instruction: Apply three layer compression as directed. Compression Stockings Add-Ons Electronic Signature(s) Signed: 01/14/2023 5:01:45 PM By: Zenaida Deed RN, BSN Entered By: Zenaida Deed on 01/14/2023 13:02:37 --------------------------------------------------------------------------------  Vitals Details Patient Name: Date of Service: PHILOPATER, MUCHA 01/14/2023 12:30 PM Medical Record Number: [id number] Patient Account Number: [id number] Date of Birth/Sex: Treating RN: 1951-01-04 (72 y.o. [name] Primary Care Tamyrah Burbage: [name] Other Clinician: Referring Ladon Vandenberghe: Treating Contrina Orona/Extender: [name] in Treatment: 48 Vital Signs Time Taken: 12:47 Temperature  (F): 98.4 Height (in): 71 Pulse (bpm): 64 Weight (lbs): 267 Respiratory Rate (breaths/min): 20 Body Mass Index (BMI): 37.2 Blood Pressure (mmHg): 134/73 Reference Range: 80 - 120 mg / dl Electronic Signature(s) Signed: 01/14/2023 5:01:45 PM By: Zenaida Deed RN, BSN Entered By: Zenaida Deed on 01/14/2023 12:48:17

## 2023-01-14 NOTE — Progress Notes (Signed)
[name] ([id number]) [id number] Page 1 of 15 Visit Report for 01/14/2023 Chief Complaint Document Details Patient Name: Date of Service: [name], [name] 01/14/2023 12:30 PM Medical Record Number: [id number] Patient Account Number: [id number] Date of Birth/Sex: Treating RN: 13-Mar-1951 (72 y.o. M) Primary Care Provider: [name] Other Clinician: Referring Provider: Treating Provider/Extender: [name] in Treatment: 48 Information Obtained from: Patient Chief Complaint Patient seen for complaints of Non-Healing Wounds. Electronic Signature(s) Signed: 01/14/2023 1:25:17 PM By: Duanne Guess MD FACS Entered By: Duanne Guess on 01/14/2023 13:25:17 -------------------------------------------------------------------------------- Debridement Details Patient Name: Date of Service: [name], [name] 01/14/2023 12:30 PM Medical Record Number: [id number] Patient Account Number: [id number] Date of Birth/Sex: Treating RN: August 30, 1950 (72 y.o. [name], [name] Primary Care Provider: [name] Other Clinician: Referring Provider: Treating Provider/Extender: [name] in Treatment: 48 Debridement Performed for Assessment: Wound #3 Right,Medial Lower Leg Performed By: Physician Duanne Guess, MD Debridement Type: Debridement Level of Consciousness (Pre-procedure): Awake and Alert Pre-procedure Verification/Time Out Yes - 13:10 Taken: Start Time: 13:12 Pain Control: Lidocaine 4% T opical Solution Percent of Wound Bed Debrided: 70% T Area Debrided (cm): otal 38.74 Tissue and other material debrided: Viable, Non-Viable, Slough, Subcutaneous, Slough Level: Skin/Subcutaneous Tissue Debridement Description: Excisional Instrument: Curette Bleeding: Minimum Hemostasis Achieved: Pressure Procedural Pain: 4 Post Procedural Pain: 3 Response to Treatment: Procedure was tolerated well Level of  Consciousness (Post- Awake and Alert procedure): Post Debridement Measurements of Total Wound Length: (cm) 9.4 Width: (cm) 7.5 Depth: (cm) 0.4 Volume: (cm) 22.148 Character of Wound/Ulcer Post Debridement: Stable Post Procedure Diagnosis [name], [name] ([id number]) [id number] Page 2 of 15 Same as Pre-procedure [name], RN Electronic Signature(s) Signed: 01/14/2023 1:44:33 PM By: Duanne Guess MD FACS Signed: 01/14/2023 5:01:45 PM By: Zenaida Deed RN, BSN Entered By: Zenaida Deed on 01/14/2023 13:20:55 -------------------------------------------------------------------------------- HPI Details Patient Name: Date of Service: [name], [name] 01/14/2023 12:30 PM Medical Record Number: [id number] Patient Account Number: [id number] Date of Birth/Sex: Treating RN: June 13, 1951 (72 y.o. M) Primary Care Provider: [name] Other Clinician: Referring Provider: Treating Provider/Extender: [name] in Treatment: 48 History of Present Illness HPI Description: ADMISSION 02/09/2022 This is a 72 year old man who recently moved to [place] from [place]. He has a history of of coronary artery disease and prior left popliteal vein DVT . He is not diabetic and does not smoke. 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 He has had multiple spinal surgeries and has limited mobility. He primarily uses a motorized wheelchair. He has had lymphedema for many years and does have lymphedema pumps although he says he does not use them frequently. He has wounds on his bilateral lower extremities. He was receiving care in [place] for these prior to his move. They were apparently packing his wounds with Betadine soaked gauze. He has worn compression wraps in the past but not recently. He did say that they helped tremendously. In clinic today, his right ABI is noncompressible but has good  signals; the left ABI was 1.17. No formal vascular studies have been performed. On his left dorsal foot, there is a circular lesion that exposes the fat layer. There is fairly heavy slough accumulation within the wound, but no significant odor. On his right medial calf, he has multiple pinholes that have slough buildup in them and probe for about 2 to 4 mm in depth. They are draining clear fluid. On his right lateral midfoot, he  has ulceration consistent with venous stasis. It is geographic and has slough accumulation. 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 02/17/2022: All of the wounds are little bit larger today and have more slough accumulation. Today, the medial calf openings are larger and probe to something that feels calcified. There is odor coming from his wounds. His vascular studies are scheduled for August 9 and 10. 02-24-2022 upon evaluation today patient presents for follow-up concerning his ongoing issues here with his lower extremities. With that being said he does have significant problems with what appears to be cellulitis unfortunately the PCR culture that was sent last week got crushed by [employer] in transit. With that being said we are going to reobtain a culture today although I am actually to do it on the right medial leg as this seems to be the most inflamed area today where there is some questionable drainage as well. I am going to remove some of the calcium deposits which are loosening obtain a deeper culture from this area. Fortunately I do not see any evidence of active infection systemically which is good news but locally I think we are having a bigger issue here. He does have his appointment next Thursday with vascular. Patient has also been referred to [facility] for further evaluation and treatment of the calcinosis for possible sulfate injections. 03/04/2022: The culture that was  performed last week grew out multiple species including Klebsiella, Morganella, and Pseudomonas aeruginosa. He had been prescribed Bactrim but this was not adequate to cover all of the species and levofloxacin was recommended. He completed the Bactrim but did not pick up the levofloxacin and begin taking it until yesterday. 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 He had arterial studies performed today. He does have evidence of peripheral arterial disease. Results copied here: +-------+-----------+-----------+------------+------------+ ABI/TBIT oday's ABIT oday's TBIPrevious ABIPrevious TBI +-------+-----------+-----------+------------+------------+ Right Cobb 0.75    +-------+-----------+-----------+------------+------------+ Left Agar 0.57    +-------+-----------+-----------+------------+------------+ Arterial wall calcification precludes accurate ankle pressures and ABIs. Summary: Right: Resting right ankle-brachial index indicates noncompressible right lower extremity arteries. The right toe-brachial index is normal. Left: Resting left ankle-brachial index indicates noncompressible left lower extremity arteries. The left toe-brachial index is abnormal. He continues to have further tissue breakdown at the right medial calf and there is ample slough accumulation along with necrotic subcutaneous tissue. The left dorsal foot wound has built up slough, as well. The right medial foot wound is nearly closed with just a light layer of eschar over the surface. The right dorsal KAISER, BELLUOMINI ([id number]) [id number] Page 3 of 15 lateral foot wound has also accumulated a fair amount of slough and remains quite tender. 03/10/2022: He has been taking the prescribed levofloxacin and has about 3 days  left of this. The erythema and induration on his right leg has improved. He continues to experience further breakdown of the right medial calf wound. There is extensive slough and debris accumulation there. There is heavy slough on his left dorsal foot wound, but no odor or purulent drainage. The right medial foot wound appears to be closed. The right dorsal lateral foot wound also has a fair amount of slough but it is less tender than at our last visit. He still does not have an appointment with dermatology. 03/22/2022: The right anterior  tibial wound has closed. The right lateral foot wound is nearly closed with just a little bit of slough. The left dorsal foot wound is smaller and continues to have some slough buildup but less so than at prior visits. There is good granulation tissue underlying the slough. Unfortunately the right medial calf wound continues to deteriorate. It has a foul odor today and a lot of necrotic tissue, the surrounding skin is also erythematous and moist. 03/30/2022: The right lateral foot wound continues to contract and just has a little bit of slough and eschar present. The left dorsal foot wound is also smaller with slough overlying good granulation tissue. The right medial calf wound actually looks quite a bit better today; there is no odor and there is much less necrotic tissue although some remains present. We have been using topical gentamicin and he has been taking oral Augmentin. 04/07/2022: The patient saw dermatology at [facility] last week. Unfortunately, it appears that they did not read any of the documentation that was provided. 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 Overall, it was a highly  unsatisfactory consultation. In the meantime, however, we were able to identify compounding pharmacy who could create a topical sodium thiosulfate ointment. The patient has that with him today. Both of the right foot wounds are now closed. The left dorsal foot wound has contracted but still has a layer of slough on the surface. The medial calf wounds on the right all look a little bit better. They still have heavy slough along with the chunks of calcium. Everything is stained purple. 04/14/2022: The wound on his dorsal left foot is a little bit smaller again today. There is still slough accumulation on the surface. The medial calf wounds have quite a bit less slough accumulation today. His right leg, however, is warm and erythematous, concerning for cellulitis. 04/14/2022: He completed his course of Augmentin yesterday. The medial calf wound sites are much cleaner today and shallower. There is less fragmented calcium in the wounds. He has a lot of lymphedema fluid-like drainage from these wounds, but no purulent discharge or malodor. The wound on his dorsal left foot has also contracted and is shallower. There is a layer of slough over healthy granulation tissue. 05/05/2022: The left dorsal foot wound is smaller today with less slough and more granulation tissue. The right medial calf wounds are shallower, but have extensive slough and some fat necrosis. The drainage has diminished considerably, however. 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 05/12/2022: The left dorsal foot wound continues to contract and fill with granulation tissue. There is slough on the wound surface. The right medial calf wounds also continue to fill with healthy tissue, but there is remaining slough and fat necrosis present. He had a significant increase in his drainage this week and it was a bright  yellow-green color. He also had a new wound open over his right anterior tibial surface associated with the spike of cutaneous calcium. The leg is more red, as well, concerning for infection. 05/19/2022: His culture returned positive for Pseudomonas. He is currently taking a course of oral levofloxacin. We have also been  using topical gentamicin mixed in with his sodium thiosulfate. All of the wounds look better this week. 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 There is slough and nonviable subcutaneous tissue still present. The new anterior tibial wound just has a light layer of slough. The dorsal foot wound also has some slough present and appears a little dry today. 05/26/2022: The right medial leg wound continues to improve. It is feeling with granulation tissue, but still accumulates a fairly thick layer of slough on the surface. The more recent anterior tibial wound has remained quite small, but also has some slough on the surface. The left dorsal foot wound has contracted somewhat and has perimeter epithelialization. He completed his oral levofloxacin. 06/02/2022: The left dorsal foot wound continues to improve significantly. There is just a little slough on the wound surface. The right medial leg wound had black drainage, but it looks like silver alginate was applied last week and I theorized that silver sulfide was created with a combination of the silver alginate and the sodium thiosulfate. It did, however, have a bit of an odor to it and extensive slough accumulation. The small anterior tibial wound also had a bit of slough and nonviable subcutaneous tissue present. The skin of his leg was rather macerated, as well. 06/09/2022: The left dorsal foot wound is smaller again this week with just a light layer of slough on the surface. The right medial leg wound looks substantially better. The leg and periwound skin are no longer red and macerated. There is good  granulation tissue forming with less slough and nonviable tissue. The anterior tibial wound just has a small amount of slough accumulation. 06/16/2022: The left dorsal foot wound continues to contract. His wrap slipped a little bit, however, and his foot is more swollen. The right medial leg wound continues to improve. The underlying tissue is more vital-appearing and there is less slough. He does have substantial drainage. The small anterior tibial wound has a bit of slough accumulation. 06/23/2022: The left dorsal foot wound is about half the size as it was last week. There is just some light slough on the surface and some eschar buildup around the edges. The right anterior leg wound is small with a little slough on the surface. Unfortunately, the right medial leg wound deteriorated fairly substantially. It is malodorous with gray/green/black discoloration. 06/30/2022: The left dorsal foot wound continues to contract. It is nearly closed with just a light layer of slough present. The right anterior leg wound is about the same size and has a small amount of slough on the surface. The right medial leg wound looks a lot better than it did last week. The odor has abated and the tissue is less pulpy and necrotic-looking. There is still a fairly thick layer of slough present. 12/13; the left dorsal foot wound looks very healthy. There was no need to change the dressing here and no debridement. He has a small area on the right anterior lower leg apparently had not changed much in size. The area on the medial leg is the most problematic area this is large with some depth and a completely nonviable surface today. 07/14/2022: The left dorsal foot wound is smaller today with just a little slough and eschar present. The right anterior lower leg wound is about the same without any significant change. The medial leg wound has a black and green surface and is malodorous. No purulent drainage and the periwound skin  is intact.  Edema control is suboptimal. 07/21/2022: The dorsal foot wound continues to contract and is nearly closed with just a little bit of slough and eschar on the surface. The right anterior lower leg wound is shallower today but otherwise the dimensions are unchanged. The medial leg wound looks significantly better although it still has a nonviable surface; the odor and black/green surface has resolved. His culture came back with multiple species sensitive to Augmentin, which was prescribed and a very low level of Pseudomonas, which should be handled by the topical gentamicin we have been using. 07/28/2022: The dorsal foot wound is down to just a very tiny opening with a little eschar on the surface. The right anterior tibial wound is about the same with some slough buildup. The right medial leg wound looks quite a bit better this week. There is thick rubbery slough on the surface, but the odor and drainage has improved significantly. 08/04/2022: The dorsal foot wound is almost healed, but the periwound skin has broken down. This appears to be secondary to moisture and adhesive from the absorbent pad. The right anterior tibial wound is shallower. The right medial leg wound continues to improve. It is smaller, still with rubbery slough on the surface. No malodor or purulent drainage. 08/11/2022: The dorsal foot wound is about the same size. It is a little bit dry with some slough accumulation. The right anterior tibial wound is also unchanged. The right medial leg wound is filling in further with good granulation tissue. Still with some slough buildup on the surface. [name], [name] ([id number]) [id number] Page 4 of 15 08/18/2022: His left leg is bright red, hot, and swollen with cellulitis. The dorsal foot wound actually looks quite good and is superficial with just a little bit of slough accumulation. The right anterior tibial wound is unchanged. The right medial leg wound  has filled in with good granulation tissue but has copious amounts of drainage. 08/25/2022: His cellulitis has responded nicely to the oral antibiotics. His dorsal foot wound is smaller but has a fair amount of slough buildup. The right anterior tibial wound remains stable and unchanged. The right medial leg wound has more granulation tissue under a layer of slough, but continues to have copious drainage. The drainage is actually causing skin irritation and threatens to result in breakdown. 09/01/2022: He continues to have profuse drainage from his wounds which is resulting in tissue maceration on both the dorsum of his left foot and the periwound distal to his medial leg wound. The dorsal foot wound is nearly healed, despite this. The medial right leg wound is filling in with good granulation tissue. We are working on getting home health assistance to change his dressings more frequently to try and control the drainage better. 09/08/2022: We have had success in controlling his drainage. The tissue maceration has improved significantly and his swelling has come down quite markedly. The medial right leg wound is much more superficial and has substantially less nonviable tissue present. The anterior tibial wound is a little bit larger, however with some nonviable tissue present. 09/15/2022: The dorsal foot wound is nearly closed. The anterior tibial wound measures slightly larger today. The medial right leg wound continues to improve quite dramatically with less nonviable tissue and more robust-looking granulation. Edema control is significantly better. 09/22/2022: The left dorsal foot wound remains open, albeit quite tiny. The anterior tibial wound is a little bit larger again today. The medial right leg wound continues to improve with an increase in robust and healthy  granulation tissue and less accumulation of nonviable tissue. 09/29/2022: His home health providers wrapped his left leg extremely tightly and  he ended up having to cut the wrap off of his foot. This resulted in his foot being very swollen and that the dorsal foot wound is a little bit larger today. The anterior tibial wound is about the same size with a little slough on the surface. The medial right leg wound continues to fill in. There are very few areas with any significant depth. There is still slough accumulation. 3/13; Patient has a left dorsal foot wound along with a right anterior tibial wound and right medial wound. We are using silver alginate to the left dorsal foot wound, Santyl and sodium thiosulfate compound ointment to the right lower extremity wounds under 4 layer compression. Patient has no issues or complaints today. 10/13/2022: The left dorsal foot wound is a little bit bigger today, but his foot is also a little bit more swollen. The right medial leg wound has filled in more and has substantially less depth. There is still some nonviable tissue present. The small anterior lower leg wound looks about the same. 10/20/2022: The left dorsal foot wound is epithelializing. There does not appear to be much open at this site. There is some eschar around the edges. The right medial leg wound continues to fill in with granulation tissue. The small anterior lower leg wound is a little bit bigger and there is a chunk of calcium protruding. He continues to have fairly copious drainage from the right leg. 10/27/2022: His home health agency failed to show up at all this past week and as a result, the excessive drainage from his right leg wound has resulted in tissue breakdown. His leg is red and irritated. The wounds themselves look about the same. The wound on his left dorsal foot is nearly closed underneath some eschar. 11/03/2022: We continue to have difficulty with his home health agency. He continues to have significant drainage from his right leg wound which has resulted in even further tissue breakdown. He also was not taking his  Lasix as prescribed and I think much of the drainage has been his lymphedema fluid choosing the path of least resistance. The wound on his left dorsal foot is closed, but in the process of cleaning the site, dry skin was pulled off and he has a new small superficial wound just adjacent to where the dorsal foot wound was. 11/12/2022: His left dorsal foot is completely healed. The right medial leg continues to deteriorate. He is not taking his Lasix as prescribed, nor is he using his lymphedema pumps at all. As a result, he has massive amounts of drainage saturating his dressings and causing further tissue breakdown on his medial leg. Today, his dressings had a foul odor, as well. 11/19/2022: His right medial leg looks even worse today. There is more erythema and moisture related tissue breakdown. The 2 anterior wounds look a little bit better. The culture that I took last week did produce Pseudomonas and he is currently taking levofloxacin for this. 11/26/2022: There has been significant improvement to his right medial leg. It is far less raw and many of the superficial open areas have epithelialized. He continues to cut the foot portion of his wrap and as result his foot is bulbous and swollen compared to the rest of his leg, where edema control is good. The smaller of the 2 anterior tibial wound is closed, while the larger is contracting somewhat with  a bit of slough at the base. 12/03/2022: His leg continues to demonstrate improvement. There has been epithelialization of much of the denuded area. He has a new small anterior tibial wound that has opened around a nidus of calcium. Edema control is improved. 12/10/2022: Continued improvement in the periwound skin. The anterior tibial wounds are about the same, but the medial leg wound has improved further. There is less nonviable tissue present. 12/17/2022: Between his visit on Tuesday with the nurse and today, he has had significantly more drainage which has  resulted in some periwound tissue maceration. I suspect this correlates somewhat with his diuretic use, as he says he takes his diuretic religiously on Saturday, Sunday, and Monday and then does not take it after Tuesday due to being out and about and not being close to easy restroom access. I suspect the excess fluid is just simply draining out of his leg. 12/21/2022: This was supposed to be a nurse visit, but his leg has broken down so significantly, that we have converted to a wound care visit. He did take his diuretics over the weekend, but despite this, he has had copious drainage and all of his tissues have broken down substantially from the right medial leg ulcer all the way down to his ankle and even wrapping posterior to this. There is blue-green staining on his dressing, suggesting ongoing presence of Pseudomonas aeruginosa. 12/24/2022: The wound already looks better than it did 2 days ago. He has been taking ciprofloxacin and the Urgo clean dressing we applied did a much better job of wicking the drainage away from his leg. There is slough on the entire surface of the medial leg wound, but the erythema and induration has improved in the past couple of days. 12/31/2022: Continued improvement of the wound. He says that the pain has basically abated since being on ciprofloxacin. Still with fairly heavy drainage and slough accumulation. 01/07/2023: Overall continued improvement. Still with heavy slough buildup, but less drainage. 01/14/2023: There is a little bit of improvement. The skin is still looking a bit excoriated. He has a few more days left of ciprofloxacin. Electronic Signature(s) Signed: 01/14/2023 1:26:08 PM By: Duanne Guess MD FACS Entered By: Duanne Guess on 01/14/2023 13:26:08 [name] ([id number]) [id number] Page 5 of 15 -------------------------------------------------------------------------------- Physical Exam Details Patient Name: Date  of Service: TIBERIUS, LOFTUS 01/14/2023 12:30 PM Medical Record Number: [id number] Patient Account Number: [id number] Date of Birth/Sex: Treating RN: 06-18-1951 (72 y.o. M) Primary Care Provider: [name] Other Clinician: Referring Provider: Treating Provider/Extender: [name] in Treatment: 48 Constitutional . . . . no acute distress. Respiratory Normal work of breathing on room air. Notes 01/14/2023: There is a little bit of improvement. The skin is still looking a bit excoriated. Electronic Signature(s) Signed: 01/14/2023 1:27:09 PM By: Duanne Guess MD FACS Entered By: Duanne Guess on 01/14/2023 13:27:08 -------------------------------------------------------------------------------- Physician Orders Details Patient Name: Date of Service: CRISTAN, SCHERZER [name] 01/14/2023 12:30 PM Medical Record Number: [id number] Patient Account Number: [id number] Date of Birth/Sex: Treating RN: 03/12/1951 (72 y.o. 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 - Dr. [name] RM 2 Return A Friday 6/28 @ 12:30 pm Nurse Visit: - Tuesday RM 1 11:15 am Anesthetic Wound #3 Right,Medial Lower Leg (In clinic) Topical Lidocaine 4% applied to wound bed Wound #6 Right,Anterior Lower Leg (In clinic) Topical Lidocaine 4% applied to  wound bed [address] [name], [name] ([id number]) [id number] Page 6 of 15 May shower with protection but do not get wound dressing(s) wet. Protect dressing(s) with water repellant cover (for example, large plastic bag) or a cast cover and may then take shower. Edema Control - Lymphedema / SCD / Other Lymphedema Pumps. Use Lymphedema pumps on leg(s) 2-3 times a day for 45-60 minutes. If wearing any wraps or hose, do not remove them. Continue exercising as instructed. Elevate legs to the level of the heart or above for 30 minutes daily and/or when sitting for 3-4 times a day throughout the day. - throughout the day Avoid standing for long periods of time. Compression stocking or Garment 20-30 mm/Hg pressure to: - juxtalite left leg daily Wound Treatment Wound #3 - Lower Leg Wound Laterality: Right, Medial Cleanser: Soap and Water 2 x Per Week/30 Days Discharge Instructions: May shower and wash wound with dial antibacterial soap and water prior to dressing change. Cleanser: Vashe 5.8 (oz) 2 x Per Week/30 Days Discharge Instructions: Cleanse the wound with Vashe prior to applying a clean dressing using gauze sponges, not tissue or cotton balls. Peri-Wound Care: Ketoconazole Cream 2% 2 x Per Week/30 Days Discharge Instructions: Apply Ketoconazole mixed with zinc to irritated periwound skin Peri-Wound Care: Zinc Oxide Ointment 30g tube 2 x Per Week/30 Days Discharge Instructions: Apply Zinc Oxide to periwound with each dressing change Peri-Wound Care: Sween Lotion (Moisturizing lotion) 2 x Per Week/30 Days Discharge Instructions: Apply moisturizing lotion as directed Topical: Gentamicin 2 x Per Week/30 Days Discharge Instructions: mixed 50:50 with mupirocin to wound bed Topical: Mupirocin Ointment 2 x Per Week/30 Days Discharge Instructions: Apply Mupirocin (Bactroban) as instructed Prim Dressing: Hydrofera Blue Ready Transfer Foam, 4x5 (in/in) 2 x Per  Week/30 Days ary Discharge Instructions: Apply to wound bed if urgo clean unavailable Prim Dressing: UrgoClean AG 4 X 5 (in/in) 2 x Per Week/30 Days ary Discharge Instructions: Apply directly to wound bed. Secondary Dressing: ABD Pad, 8x10 2 x Per Week/30 Days Discharge Instructions: Apply over primary dressing as directed. Secondary Dressing: Zetuvit Plus 4x8 in 2 x Per Week/30 Days Discharge Instructions: Apply over primary dressing as directed. Compression Wrap: ThreePress (3 layer compression wrap) 2 x Per Week/30 Days Discharge Instructions: Apply three layer compression as directed. Wound #6 - Lower Leg Wound Laterality: Right, Anterior Peri-Wound Care: Sween Lotion (Moisturizing lotion) 2 x Per Week/30 Days Discharge Instructions: Apply moisturizing lotion as directed Prim Dressing: Maxorb Extra Ag+ Alginate Dressing, 2x2 (in/in) 2 x Per Week/30 Days ary Discharge Instructions: Apply to wound bed as instructed Secondary Dressing: Woven Gauze Sponge, Non-Sterile 4x4 in 2 x Per Week/30 Days Discharge Instructions: Apply over primary dressing as directed. Compression Wrap: ThreePress (3 layer compression wrap) 2 x Per Week/30 Days Discharge Instructions: Apply three layer compression as directed. Electronic Signature(s) Signed: 01/14/2023 1:44:33 PM By: Duanne Guess MD FACS Entered By: Duanne Guess on 01/14/2023 13:27:19 Problem List Details -------------------------------------------------------------------------------- [name] ([id number]) [id number] Page 7 of 15 Patient Name: Date of Service: [name], [name] 01/14/2023 12:30 PM Medical Record Number: [id number] Patient Account Number: [id number] Date of Birth/Sex: Treating  RN: 11-18-1950 (72 y.o. 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) 02/09/2022 No Yes I10 Essential (primary) hypertension 02/09/2022 No Yes I25.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 angina pectoris 02/09/2022 No Yes I89.0 Lymphedema, not elsewhere classified 02/09/2022 No Yes Z86.718 Personal history of other venous thrombosis and embolism 02/09/2022 No Yes Inactive Problems ICD-10 Code Description Active Date Inactive Date L97.512 Non-pressure chronic ulcer of other part of right foot with fat layer exposed 02/17/2022 02/17/2022 Resolved Problems ICD-10 Code Description Active Date Resolved Date L97.522 Non-pressure chronic ulcer of other part of left foot with fat layer exposed 02/09/2022 02/09/2022 Electronic Signature(s) Signed: 01/14/2023 1:24:44 PM By: Duanne Guess MD FACS Entered By: Duanne Guess on 01/14/2023 13:24:44 -------------------------------------------------------------------------------- Progress Note Details Patient Name: Date of Service: [name] 01/14/2023 12:30 PM Medical Record Number: [id number] Patient Account Number: [id number] BRANDI, ARMATO ([id number]) [id number] Page 8 of 15 Date of Birth/Sex: Treating RN: Feb 08, 1951 (72 y.o. M) Primary Care Provider: Other Clinician: [name] Referring Provider: Treating Provider/Extender: [name] in Treatment: 48 Subjective Chief Complaint Information obtained from Patient Patient seen for complaints of Non-Healing Wounds. History of Present Illness (HPI) ADMISSION 02/09/2022 This is a 72 year old man who recently moved to [place] from [place]. He has a history of of coronary artery disease and prior left  popliteal vein DVT . He is not diabetic and does not smoke. 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 He has had multiple spinal surgeries and has limited mobility. He primarily uses a motorized wheelchair. He has had lymphedema for many years and does have lymphedema pumps although he says he does not use them frequently. He has wounds on his bilateral lower extremities. He was receiving care in [place] for these prior to his move. They were apparently packing his wounds with Betadine soaked gauze. He has worn compression wraps in the past but not recently. He did say that they helped tremendously. In clinic today, his right ABI is noncompressible but has good signals; the left ABI was 1.17. No formal vascular studies have been performed. On his left dorsal foot, there is a circular lesion that exposes the fat layer. There is fairly heavy slough accumulation within the wound, but no significant odor. On his right medial calf, he has multiple pinholes that have slough buildup in them and probe for about 2 to 4 mm in depth. They are draining clear fluid. On his right lateral midfoot, he has ulceration consistent with venous stasis. It is geographic and has slough accumulation. 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 02/17/2022: All of the wounds are little bit larger today and have more slough accumulation. Today, the medial calf openings are larger and probe to something that feels calcified. There is odor coming from his wounds. His vascular studies are scheduled for August 9 and 10. 02-24-2022 upon evaluation today patient presents for follow-up concerning his ongoing issues here with his lower extremities. With that being said he does have significant problems with what appears to be cellulitis unfortunately the PCR culture that was sent last week  got crushed by [employer] in transit. With that being  said we are going to reobtain a culture today although I am actually to do it on the right medial leg as this seems to be the most inflamed area today where there is some questionable drainage as well. I am going to remove some of the calcium deposits which are loosening obtain a deeper culture from this area. Fortunately I do not see any evidence of active infection systemically which is good news but locally I think we are having a bigger issue here. He does have his appointment next Thursday with vascular. Patient has also been referred to [facility] for further evaluation and treatment of the calcinosis for possible sulfate injections. 03/04/2022: The culture that was performed last week grew out multiple species including Klebsiella, Morganella, and Pseudomonas aeruginosa. He had been prescribed Bactrim but this was not adequate to cover all of the species and levofloxacin was recommended. He completed the Bactrim but did not pick up the levofloxacin and begin taking it until yesterday. 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 He had arterial studies performed today. He does have evidence of peripheral arterial disease. Results copied here: +-------+-----------+-----------+------------+------------+ ABI/TBIT oday's ABIT oday's TBIPrevious ABIPrevious TBI +-------+-----------+-----------+------------+------------+ Right [place] 0.75    +-------+-----------+-----------+------------+------------+ Left  0.57    +-------+-----------+-----------+------------+------------+ Arterial wall calcification precludes accurate ankle pressures and ABIs. Summary: Right: Resting right ankle-brachial index indicates noncompressible right lower extremity arteries. The right toe-brachial index is normal. Left: Resting left  ankle-brachial index indicates noncompressible left lower extremity arteries. The left toe-brachial index is abnormal. He continues to have further tissue breakdown at the right medial calf and there is ample slough accumulation along with necrotic subcutaneous tissue. The left dorsal foot wound has built up slough, as well. The right medial foot wound is nearly closed with just a light layer of eschar over the surface. The right dorsal lateral foot wound has also accumulated a fair amount of slough and remains quite tender. 03/10/2022: He has been taking the prescribed levofloxacin and has about 3 days left of this. The erythema and induration on his right leg has improved. He continues to experience further breakdown of the right medial calf wound. There is extensive slough and debris accumulation there. There is heavy slough on his left dorsal foot wound, but no odor or purulent drainage. The right medial foot wound appears to be closed. The right dorsal lateral foot wound also has a fair amount of slough but it is less tender than at our last visit. He still does not have an appointment with dermatology. 03/22/2022: The right anterior tibial wound has closed. The right lateral foot wound is nearly closed with just a little bit of slough. The left dorsal foot wound is smaller and continues to have some slough buildup but less so than at prior visits. There is good granulation tissue underlying the slough. Unfortunately the right medial calf wound continues to deteriorate. It has a foul odor today and a lot of necrotic tissue, the surrounding skin is also erythematous and moist. 03/30/2022: The right lateral foot wound continues to contract and just has a little bit of slough and eschar present. The left dorsal foot wound is also smaller with slough overlying good granulation tissue. The right medial calf wound actually looks quite a bit better today; there is no odor and  there is much less  necrotic tissue although some remains present. We have been using topical gentamicin and he has been taking oral Augmentin. 04/07/2022: The patient saw dermatology at [facility] last week. Unfortunately, it appears that they did not read any of the documentation that was provided. 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 Overall, it was a highly unsatisfactory consultation. In the meantime, however, we were able to identify compounding pharmacy who could create a topical sodium thiosulfate ointment. The patient has that with him today. Both of the right foot wounds are now closed. The left dorsal foot wound has contracted but still has a layer of slough on the surface. The medial calf wounds on the right all look a little bit better. They still have heavy slough along with the chunks of calcium. Everything is stained purple. [name], [name] ([id number]) [id number] Page 9 of 15 04/14/2022: The wound on his dorsal left foot is a little bit smaller again today. There is still slough accumulation on the surface. The medial calf wounds have quite a bit less slough accumulation today. His right leg, however, is warm and erythematous, concerning for cellulitis. 04/14/2022: He completed his course of Augmentin yesterday. The medial calf wound sites are much cleaner today and shallower. There is less fragmented calcium in the wounds. He has a lot of lymphedema fluid-like drainage from these wounds, but no purulent discharge or malodor. The wound on his dorsal left foot has also contracted and is shallower. There is a layer of slough over healthy granulation tissue. 05/05/2022: The left dorsal foot wound is smaller today  with less slough and more granulation tissue. The right medial calf wounds are shallower, but have extensive slough and some fat necrosis. The drainage has diminished considerably, however. 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 05/12/2022: The left dorsal foot wound continues to contract and fill with granulation tissue. There is slough on the wound surface. The right medial calf wounds also continue to fill with healthy tissue, but there is remaining slough and fat necrosis present. He had a significant increase in his drainage this week and it was a bright yellow-green color. He also had a new wound open over his right anterior tibial surface associated with the spike of cutaneous calcium. The leg is more red, as well, concerning for infection. 05/19/2022: His culture returned positive for Pseudomonas. He is currently taking a course of oral levofloxacin. We have also been using topical gentamicin mixed in with his sodium thiosulfate. All of the wounds look better this week. 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 There is slough and nonviable subcutaneous tissue still present. The new anterior tibial wound just has a light layer of slough. The dorsal foot wound also has some slough present and appears a little dry today. 05/26/2022: The right medial leg wound continues to improve. It is feeling with granulation tissue, but still accumulates a fairly thick layer of slough on the surface. The more recent anterior tibial wound has remained quite small, but also has some slough on the surface. The left dorsal foot wound has contracted somewhat and has perimeter  epithelialization. He completed his oral levofloxacin. 06/02/2022: The left dorsal foot wound continues to improve significantly. There is just a little slough on the wound surface. The right  medial leg wound had black drainage, but it looks like silver alginate was applied last week and I theorized that silver sulfide was created with a combination of the silver alginate and the sodium thiosulfate. It did, however, have a bit of an odor to it and extensive slough accumulation. The small anterior tibial wound also had a bit of slough and nonviable subcutaneous tissue present. The skin of his leg was rather macerated, as well. 06/09/2022: The left dorsal foot wound is smaller again this week with just a light layer of slough on the surface. The right medial leg wound looks substantially better. The leg and periwound skin are no longer red and macerated. There is good granulation tissue forming with less slough and nonviable tissue. The anterior tibial wound just has a small amount of slough accumulation. 06/16/2022: The left dorsal foot wound continues to contract. His wrap slipped a little bit, however, and his foot is more swollen. The right medial leg wound continues to improve. The underlying tissue is more vital-appearing and there is less slough. He does have substantial drainage. The small anterior tibial wound has a bit of slough accumulation. 06/23/2022: The left dorsal foot wound is about half the size as it was last week. There is just some light slough on the surface and some eschar buildup around the edges. The right anterior leg wound is small with a little slough on the surface. Unfortunately, the right medial leg wound deteriorated fairly substantially. It is malodorous with gray/green/black discoloration. 06/30/2022: The left dorsal foot wound continues to contract. It is nearly closed with just a light layer of slough present. The right anterior leg wound is about the same size and has a small amount of slough on the surface. The right medial leg wound looks a lot better than it did last week. The odor has abated and the tissue is less pulpy and necrotic-looking. There is  still a fairly thick layer of slough present. 12/13; the left dorsal foot wound looks very healthy. There was no need to change the dressing here and no debridement. He has a small area on the right anterior lower leg apparently had not changed much in size. The area on the medial leg is the most problematic area this is large with some depth and a completely nonviable surface today. 07/14/2022: The left dorsal foot wound is smaller today with just a little slough and eschar present. The right anterior lower leg wound is about the same without any significant change. The medial leg wound has a black and green surface and is malodorous. No purulent drainage and the periwound skin is intact. Edema control is suboptimal. 07/21/2022: The dorsal foot wound continues to contract and is nearly closed with just a little bit of slough and eschar on the surface. The right anterior lower leg wound is shallower today but otherwise the dimensions are unchanged. The medial leg wound looks significantly better although it still has a nonviable surface; the odor and black/green surface has resolved. His culture came back with multiple species sensitive to Augmentin, which was prescribed and a very low level of Pseudomonas, which should be handled by the topical gentamicin we have been using. 07/28/2022: The dorsal foot wound is down to just a very tiny opening with a little eschar on the surface. The right anterior tibial  wound is about the same with some slough buildup. The right medial leg wound looks quite a bit better this week. There is thick rubbery slough on the surface, but the odor and drainage has improved significantly. 08/04/2022: The dorsal foot wound is almost healed, but the periwound skin has broken down. This appears to be secondary to moisture and adhesive from the absorbent pad. The right anterior tibial wound is shallower. The right medial leg wound continues to improve. It is smaller, still with  rubbery slough on the surface. No malodor or purulent drainage. 08/11/2022: The dorsal foot wound is about the same size. It is a little bit dry with some slough accumulation. The right anterior tibial wound is also unchanged. The right medial leg wound is filling in further with good granulation tissue. Still with some slough buildup on the surface. 08/18/2022: His left leg is bright red, hot, and swollen with cellulitis. The dorsal foot wound actually looks quite good and is superficial with just a little bit of slough accumulation. The right anterior tibial wound is unchanged. The right medial leg wound has filled in with good granulation tissue but has copious amounts of drainage. 08/25/2022: His cellulitis has responded nicely to the oral antibiotics. His dorsal foot wound is smaller but has a fair amount of slough buildup. The right anterior tibial wound remains stable and unchanged. The right medial leg wound has more granulation tissue under a layer of slough, but continues to have copious drainage. The drainage is actually causing skin irritation and threatens to result in breakdown. 09/01/2022: He continues to have profuse drainage from his wounds which is resulting in tissue maceration on both the dorsum of his left foot and the periwound distal to his medial leg wound. The dorsal foot wound is nearly healed, despite this. The medial right leg wound is filling in with good granulation tissue. We are working on getting home health assistance to change his dressings more frequently to try and control the drainage better. 09/08/2022: We have had success in controlling his drainage. The tissue maceration has improved significantly and his swelling has come down quite markedly. The medial right leg wound is much more superficial and has substantially less nonviable tissue present. The anterior tibial wound is a little bit larger, however with some nonviable tissue present. 09/15/2022: The dorsal foot  wound is nearly closed. The anterior tibial wound measures slightly larger today. The medial right leg wound continues to improve quite dramatically with less nonviable tissue and more robust-looking granulation. Edema control is significantly better. 09/22/2022: The left dorsal foot wound remains open, albeit quite tiny. The anterior tibial wound is a little bit larger again today. The medial right leg wound continues to improve with an increase in robust and healthy granulation tissue and less accumulation of nonviable tissue. [name], [name] ([id number]) [id number] Page 10 of 15 09/29/2022: His home health providers wrapped his left leg extremely tightly and he ended up having to cut the wrap off of his foot. This resulted in his foot being very swollen and that the dorsal foot wound is a little bit larger today. The anterior tibial wound is about the same size with a little slough on the surface. The medial right leg wound continues to fill in. There are very few areas with any significant depth. There is still slough accumulation. 3/13; Patient has a left dorsal foot wound along with a right anterior tibial wound and right medial wound. We are using silver alginate to the left dorsal  foot wound, Santyl and sodium thiosulfate compound ointment to the right lower extremity wounds under 4 layer compression. Patient has no issues or complaints today. 10/13/2022: The left dorsal foot wound is a little bit bigger today, but his foot is also a little bit more swollen. The right medial leg wound has filled in more and has substantially less depth. There is still some nonviable tissue present. The small anterior lower leg wound looks about the same. 10/20/2022: The left dorsal foot wound is epithelializing. There does not appear to be much open at this site. There is some eschar around the edges. The right medial leg wound continues to fill in with granulation tissue. The small anterior  lower leg wound is a little bit bigger and there is a chunk of calcium protruding. He continues to have fairly copious drainage from the right leg. 10/27/2022: His home health agency failed to show up at all this past week and as a result, the excessive drainage from his right leg wound has resulted in tissue breakdown. His leg is red and irritated. The wounds themselves look about the same. The wound on his left dorsal foot is nearly closed underneath some eschar. 11/03/2022: We continue to have difficulty with his home health agency. He continues to have significant drainage from his right leg wound which has resulted in even further tissue breakdown. He also was not taking his Lasix as prescribed and I think much of the drainage has been his lymphedema fluid choosing the path of least resistance. The wound on his left dorsal foot is closed, but in the process of cleaning the site, dry skin was pulled off and he has a new small superficial wound just adjacent to where the dorsal foot wound was. 11/12/2022: His left dorsal foot is completely healed. The right medial leg continues to deteriorate. He is not taking his Lasix as prescribed, nor is he using his lymphedema pumps at all. As a result, he has massive amounts of drainage saturating his dressings and causing further tissue breakdown on his medial leg. Today, his dressings had a foul odor, as well. 11/19/2022: His right medial leg looks even worse today. There is more erythema and moisture related tissue breakdown. The 2 anterior wounds look a little bit better. The culture that I took last week did produce Pseudomonas and he is currently taking levofloxacin for this. 11/26/2022: There has been significant improvement to his right medial leg. It is far less raw and many of the superficial open areas have epithelialized. He continues to cut the foot portion of his wrap and as result his foot is bulbous and swollen compared to the rest of his leg, where  edema control is good. The smaller of the 2 anterior tibial wound is closed, while the larger is contracting somewhat with a bit of slough at the base. 12/03/2022: His leg continues to demonstrate improvement. There has been epithelialization of much of the denuded area. He has a new small anterior tibial wound that has opened around a nidus of calcium. Edema control is improved. 12/10/2022: Continued improvement in the periwound skin. The anterior tibial wounds are about the same, but the medial leg wound has improved further. There is less nonviable tissue present. 12/17/2022: Between his visit on Tuesday with the nurse and today, he has had significantly more drainage which has resulted in some periwound tissue maceration. I suspect this correlates somewhat with his diuretic use, as he says he takes his diuretic religiously on Saturday, Sunday, and  Monday and then does not take it after Tuesday due to being out and about and not being close to easy restroom access. I suspect the excess fluid is just simply draining out of his leg. 12/21/2022: This was supposed to be a nurse visit, but his leg has broken down so significantly, that we have converted to a wound care visit. He did take his diuretics over the weekend, but despite this, he has had copious drainage and all of his tissues have broken down substantially from the right medial leg ulcer all the way down to his ankle and even wrapping posterior to this. There is blue-green staining on his dressing, suggesting ongoing presence of Pseudomonas aeruginosa. 12/24/2022: The wound already looks better than it did 2 days ago. He has been taking ciprofloxacin and the Urgo clean dressing we applied did a much better job of wicking the drainage away from his leg. There is slough on the entire surface of the medial leg wound, but the erythema and induration has improved in the past couple of days. 12/31/2022: Continued improvement of the wound. He says that  the pain has basically abated since being on ciprofloxacin. Still with fairly heavy drainage and slough accumulation. 01/07/2023: Overall continued improvement. Still with heavy slough buildup, but less drainage. 01/14/2023: There is a little bit of improvement. The skin is still looking a bit excoriated. He has a few more days left of ciprofloxacin. Patient History Information obtained from Patient, Chart. Family History Cancer - Mother, Heart Disease - Father,Mother, Hypertension - Father,Siblings, Lung Disease - Siblings, No family history of Diabetes, Hereditary Spherocytosis, Kidney Disease, Seizures, Stroke, Thyroid Problems, Tuberculosis. Social History Former smoker - quit 1991, Marital Status - Married, Alcohol Use - Moderate, Drug Use - No History, Caffeine Use - Daily - coffee. Medical History Eyes Patient has history of Cataracts - right eye removed Denies history of Glaucoma, Optic Neuritis Ear/Nose/Mouth/Throat Denies history of Chronic sinus problems/congestion, Middle ear problems Hematologic/Lymphatic Patient has history of Anemia - macrocytic, Lymphedema Cardiovascular Patient has history of Congestive Heart Failure, Deep Vein Thrombosis - left leg, Hypertension, Peripheral Venous Disease Endocrine Denies history of Type I Diabetes, Type II Diabetes Genitourinary Denies history of End Stage Renal Disease Integumentary (Skin) Denies history of History of Burn Oncologic Denies history of Received Chemotherapy, Received Radiation Psychiatric [name], [name] ([id number]) [id number] Page 11 of 15 Denies history of Anorexia/bulimia, Confinement Anxiety Hospitalization/Surgery History - cervical fusion. - lumbar surgery. - coronary stent placement. - lasik eye surgery. - hydrocele excision. Medical A Surgical History Notes nd Constitutional Symptoms (General Health) obesity Ear/Nose/Mouth/Throat hard of hearing Respiratory frozen diaphragm  right Cardiovascular hyperlipidemia Musculoskeletal myositis, lumbar DDD, spinal stenosis, cervical facet joint syndrome Objective Constitutional no acute distress. Vitals Time Taken: 12:47 PM, Height: 71 in, Weight: 267 lbs, BMI: 37.2, Temperature: 98.4 F, Pulse: 64 bpm, Respiratory Rate: 20 breaths/min, Blood Pressure: 134/73 mmHg. Respiratory Normal work of breathing on room air. General Notes: 01/14/2023: There is a little bit of improvement. The skin is still looking a bit excoriated. Integumentary (Hair, Skin) Wound #3 status is Open. Original cause of wound was Gradually Appeared. The date acquired was: 12/07/2021. The wound has been in treatment 48 weeks. The wound is located on the Right,Medial Lower Leg. The wound measures 9.4cm length x 7.5cm width x 0.4cm depth; 55.371cm^2 area and 22.148cm^3 volume. There is Fat Layer (Subcutaneous Tissue) exposed. There is no tunneling or undermining noted. There is a large amount of serous drainage noted. The wound margin  is flat and intact. There is medium (34-66%) red granulation within the wound bed. There is a medium (34-66%) amount of necrotic tissue within the wound bed including Adherent Slough. The periwound skin appearance had no abnormalities noted for moisture. The periwound skin appearance exhibited: Excoriation, Hemosiderin Staining. Periwound temperature was noted as No Abnormality. The periwound has tenderness on palpation. Wound #6 status is Open. Original cause of wound was Gradually Appeared. The date acquired was: 05/12/2022. The wound has been in treatment 35 weeks. The wound is located on the Right,Anterior Lower Leg. The wound measures 1.5cm length x 0.4cm width x 0.7cm depth; 0.471cm^2 area and 0.33cm^3 volume. There is Fat Layer (Subcutaneous Tissue) exposed. There is no tunneling or undermining noted. There is a medium amount of serosanguineous drainage noted. The wound margin is epibole. There is medium (34-66%) red  granulation within the wound bed. There is a small (1-33%) amount of necrotic tissue within the wound bed including Adherent Slough. The periwound skin appearance had no abnormalities noted for texture. The periwound skin appearance had no abnormalities noted for moisture. The periwound skin appearance exhibited: Hemosiderin Staining. Periwound temperature was noted as No Abnormality. 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 There was a Excisional Skin/Subcutaneous Tissue Debridement with a total area of 38.74 sq cm performed by Duanne Guess, MD. With the following instrument(s): Curette to remove Viable and Non-Viable tissue/material. Material removed includes Subcutaneous Tissue and Slough and after achieving pain control using Lidocaine 4% T opical Solution. No specimens were taken. A time out was conducted at 13:10, prior to the start of the procedure. A Minimum amount of bleeding was controlled with Pressure. The procedure was tolerated well with a pain level of 4 throughout and a pain level of 3 following the procedure. Post Debridement Measurements: 9.4cm length x 7.5cm width x 0.4cm depth; 22.148cm^3 volume. Character of Wound/Ulcer Post Debridement is stable. AVAN, GULLETT ([id number]) [id number] Page 12 of 15 Post procedure Diagnosis Wound #3: Same as Pre-Procedure General Notes: scribed for Dr. [name] by Zenaida Deed, RN. Pre-procedure diagnosis of Wound #3  is a Lymphedema located on the Right,Medial Lower Leg . There was a Three Layer Compression Therapy Procedure by Zenaida Deed, RN. Post procedure Diagnosis Wound #3: Same as Pre-Procedure Plan Follow-up Appointments: Return Appointment in 1 week. - Dr. [name] RM 2 Friday 6/28 @ 12:30 pm Nurse Visit: - Tuesday RM 1 11:15 am Anesthetic: Wound #3 Right,Medial Lower Leg: (In clinic) Topical Lidocaine 4% applied to wound bed Wound #6 Right,Anterior Lower Leg: (In clinic) Topical Lidocaine 4% applied to wound bed Bathing/ Shower/ Hygiene: May shower with protection but do not get wound dressing(s) wet. Protect dressing(s) with water repellant cover (for example, large plastic bag) or a cast cover and may then take shower. Edema Control - Lymphedema / SCD / Other: Lymphedema Pumps. Use Lymphedema pumps on leg(s) 2-3 times a day for 45-60 minutes. If wearing any wraps or hose, do not remove them. Continue exercising as instructed. Elevate legs to the level of the heart or above for 30 minutes daily and/or when sitting for 3-4 times a day throughout  the day. - throughout the day Avoid standing for long periods of time. Compression stocking or Garment 20-30 mm/Hg pressure to: - juxtalite left leg daily WOUND #3: - Lower Leg Wound Laterality: Right, Medial Cleanser: Soap and Water 2 x Per Week/30 Days Discharge Instructions: May shower and wash wound with dial antibacterial soap and water prior to dressing change. Cleanser: Vashe 5.8 (oz) 2 x Per Week/30 Days Discharge Instructions: Cleanse the wound with Vashe prior to applying a clean dressing using gauze sponges, not tissue or cotton balls. Peri-Wound Care: Ketoconazole Cream 2% 2 x Per Week/30 Days Discharge Instructions: Apply Ketoconazole mixed with zinc to irritated periwound skin Peri-Wound Care: Zinc Oxide Ointment 30g tube 2 x Per Week/30 Days Discharge Instructions: Apply Zinc Oxide to periwound with each dressing change Peri-Wound  Care: Sween Lotion (Moisturizing lotion) 2 x Per Week/30 Days Discharge Instructions: Apply moisturizing lotion as directed Topical: Gentamicin 2 x Per Week/30 Days Discharge Instructions: mixed 50:50 with mupirocin to wound bed Topical: Mupirocin Ointment 2 x Per Week/30 Days Discharge Instructions: Apply Mupirocin (Bactroban) as instructed Prim Dressing: Hydrofera Blue Ready Transfer Foam, 4x5 (in/in) 2 x Per Week/30 Days ary Discharge Instructions: Apply to wound bed if urgo clean unavailable Prim Dressing: UrgoClean AG 4 X 5 (in/in) 2 x Per Week/30 Days ary Discharge Instructions: Apply directly to wound bed. Secondary Dressing: ABD Pad, 8x10 2 x Per Week/30 Days Discharge Instructions: Apply over primary dressing as directed. Secondary Dressing: Zetuvit Plus 4x8 in 2 x Per Week/30 Days Discharge Instructions: Apply over primary dressing as directed. Com pression Wrap: ThreePress (3 layer compression wrap) 2 x Per Week/30 Days Discharge Instructions: Apply three layer compression as directed. WOUND #6: - Lower Leg Wound Laterality: Right, Anterior Peri-Wound Care: Sween Lotion (Moisturizing lotion) 2 x Per Week/30 Days Discharge Instructions: Apply moisturizing lotion as directed Prim Dressing: Maxorb Extra Ag+ Alginate Dressing, 2x2 (in/in) 2 x Per Week/30 Days ary Discharge Instructions: Apply to wound bed as instructed Secondary Dressing: Woven Gauze Sponge, Non-Sterile 4x4 in 2 x Per Week/30 Days Discharge Instructions: Apply over primary dressing as directed. Com pression Wrap: ThreePress (3 layer compression wrap) 2 x Per Week/30 Days Discharge Instructions: Apply three layer compression as directed. 01/14/2023: There is a little bit of improvement. The skin is still looking a bit excoriated. I used a curette to debride slough and subcutaneous tissue from his wound. We will continue topical gentamicin and mupirocin to the open areas of his wound. I am going to add ketoconazole  to the zinc oxide that we are applying to the periwound skin. He did not bring his Urgo clean dressings with him today so we will use Hydrofera Blue ready foam. Continue 3 layer compression. He will have a nurse visit on Tuesday and see me again a week from today. Electronic Signature(s) Signed: 01/14/2023 1:28:54 PM By: Duanne Guess MD FACS Entered By: Duanne Guess on 01/14/2023 13:28:54 [name] ([id number]) [id number] Page 13 of 15 -------------------------------------------------------------------------------- HxROS Details Patient Name: Date of Service: [name], [name] 01/14/2023 12:30 PM Medical Record Number: [id number] Patient Account Number: [id number] Date of Birth/Sex: Treating RN: [birth year] (72 y.o. M) Primary Care Provider: [name] Other Clinician: Referring Provider: Treating Provider/Extender: [name] in Treatment: 48 Information Obtained From Patient Chart Constitutional Symptoms (General Health) Medical History: Past Medical History Notes: obesity Eyes Medical History: Positive for: Cataracts - right eye removed Negative for: Glaucoma; Optic Neuritis Ear/Nose/Mouth/Throat Medical History: Negative for: Chronic sinus problems/congestion; Middle ear problems  Past Medical History Notes: hard of hearing Hematologic/Lymphatic Medical History: Positive for: Anemia - macrocytic; Lymphedema Respiratory Medical History: Past Medical History Notes: frozen diaphragm right Cardiovascular Medical History: Positive for: Congestive Heart Failure; Deep Vein Thrombosis - left leg; Hypertension; Peripheral Venous Disease Past Medical History Notes: hyperlipidemia Endocrine Medical History: Negative for: Type I Diabetes; Type II Diabetes Genitourinary Medical History: Negative for: End Stage Renal Disease Integumentary (Skin) Medical History: Negative for: History of  Burn Musculoskeletal Medical History: Past Medical History Notes: myositis, lumbar DDD, spinal stenosis, cervical facet joint syndrome Oncologic Medical HistorySHIRL, LUDINGTON ([id number]) [id number] Page 14 of 15 Negative for: Received Chemotherapy; Received Radiation Psychiatric Medical History: Negative for: Anorexia/bulimia; Confinement Anxiety HBO Extended History Items Eyes: Cataracts Immunizations Pneumococcal Vaccine: Received Pneumococcal Vaccination: Yes Received Pneumococcal Vaccination On or After 60th Birthday: Yes Implantable Devices No devices added Hospitalization / Surgery History Type of Hospitalization/Surgery cervical fusion lumbar surgery coronary stent placement lasik eye surgery hydrocele excision Family and Social History Cancer: Yes - Mother; Diabetes: No; Heart Disease: Yes - Father,Mother; Hereditary Spherocytosis: No; Hypertension: Yes - Father,Siblings; Kidney Disease: No; Lung Disease: Yes - Siblings; Seizures: No; Stroke: No; Thyroid Problems: No; Tuberculosis: No; Former smoker - quit 1991; Marital Status - Married; Alcohol Use: Moderate; Drug Use: No History; Caffeine Use: Daily - coffee; Financial Concerns: No; Food, Clothing or Shelter Needs: No; Support System Lacking: No; Transportation Concerns: No Electronic Signature(s) Signed: 01/14/2023 1:44:33 PM By: Duanne Guess MD FACS Entered By: Duanne Guess on 01/14/2023 13:26:17 -------------------------------------------------------------------------------- SuperBill Details Patient Name: Date of Service: MALEKI, HIPPE 01/14/2023 Medical Record Number: [id number] Patient Account Number: [id number] Date of Birth/Sex: Treating RN: 01-05-1951 (72 y.o. M) Primary Care Provider: [name] Other Clinician: Referring Provider: Treating Provider/Extender: [name] in Treatment: 48 Diagnosis Coding ICD-10 Codes Code  Description [phone] 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) I10 Essential (primary) hypertension I25.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 angina pectoris I89.0 Lymphedema, not elsewhere classified Z86.718 Personal history of other venous thrombosis and embolism Facility Procedures : CADIN, LUKA Code: [id number] RT ([id number]) L97 Description: 11042 - DEB SUBQ TISSUE 20 SQ CM/< ICD-10 Diagnosis Description [phone] .812 Non-pressure chronic ulcer of other part of right lower leg with fat layer expose Modifier: [id number]. [id number] Quantity: 1 pdf Page 15 of 15 : 36 CPT4 Code: [phone] ICD- L97 Description: 5 - DEB SUBQ TISS EA ADDL 20CM 10 Diagnosis Description .812 Non-pressure chronic ulcer of other part of right lower leg with fat layer expose Modifier: 1 d Quantity: Physician Procedures : CPT4 Code Description Modifier [id number] 99214 - WC PHYS LEVEL 4 - EST PT 25 ICD-10 Diagnosis Description 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) I89.0 Lymphedema, not elsewhere classified Quantity: 1 : [id number] 11042 - WC PHYS SUBQ TISS 20 SQ CM ICD-10 Diagnosis Description 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ed Quantity: 1 : [id number] 11045 - WC PHYS SUBQ TISS EA ADDL 20 CM ICD-10 Diagnosis Description 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ed Quantity: 1 Electronic Signature(s) Signed: 01/14/2023 1:29:17 PM By: Duanne Guess MD FACS Entered By: Duanne Guess on 01/14/2023 13:29:17

## 2023-01-18 ENCOUNTER — Encounter (HOSPITAL_BASED_OUTPATIENT_CLINIC_OR_DEPARTMENT_OTHER): Payer: Medicare Other | Admitting: General Surgery

## 2023-01-18 DIAGNOSIS — I110 Hypertensive heart disease with heart failure: Secondary | ICD-10-CM | POA: Diagnosis not present

## 2023-01-18 DIAGNOSIS — L97812 Non-pressure chronic ulcer of other part of right lower leg with fat layer exposed: Secondary | ICD-10-CM | POA: Diagnosis not present

## 2023-01-18 DIAGNOSIS — L942 Calcinosis cutis: Secondary | ICD-10-CM | POA: Diagnosis not present

## 2023-01-18 DIAGNOSIS — I509 Heart failure, unspecified: Secondary | ICD-10-CM | POA: Diagnosis not present

## 2023-01-18 DIAGNOSIS — I2510 Atherosclerotic heart disease of native coronary artery without angina pectoris: Secondary | ICD-10-CM | POA: Diagnosis not present

## 2023-01-18 DIAGNOSIS — I872 Venous insufficiency (chronic) (peripheral): Secondary | ICD-10-CM | POA: Diagnosis not present

## 2023-01-18 NOTE — Progress Notes (Signed)
MAXIMILLIAN, HABIBI ([id number]) [id number] Page 1 of 1 Visit Report for 01/18/2023 SuperBill Details Patient Name: Date of Service: DANYAEL, ALIPIO 01/18/2023 Medical Record Number: [id number] Patient Account Number: [id number] Date of Birth/Sex: Treating RN: [birth year] (72 y.o. Cline Cools Primary Care Provider: [name] Other Clinician: Referring Provider: Treating Provider/Extender: [name] in Treatment: 49 Diagnosis Coding ICD-10 Codes Code Description [phone] 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) I10 Essential (primary) hypertension I25.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 angina pectoris I89.0 Lymphedema, not elsewhere classified Z86.718 Personal history of other venous thrombosis and embolism Facility Procedures CPT4 Code Description Modifier Quantity [id number] (Facility Use Only) [phone] - APPLY MULTLAY COMPRS LWR RT LEG 1 Electronic Signature(s) Signed: 01/18/2023 11:48:10 AM By: Duanne Guess MD FACS Signed: 01/18/2023 3:30:06 PM By: Redmond Pulling RN, BSN Entered By: Redmond Pulling on 01/18/2023 11:38:59

## 2023-01-18 NOTE — Progress Notes (Signed)
[name], [name] ([id number]) [id number] Page 1 of 5 Visit Report for 01/18/2023 Arrival Information Details Patient Name: Date of Service: [name], [name] 01/18/2023 11:15 A M Medical Record Number: [id number] Patient Account Number: [id number] Date of Birth/Sex: Treating RN: 1951-03-10 (72 y.o. Cline Cools Primary Care Ednamae Schiano: [name] Other Clinician: Referring Millie Shorb: Treating Tyler Robidoux/Extender: [name] in Treatment: 73 Visit Information History Since Last Visit Added or deleted any medications: No Patient Arrived: Wheel Chair Any new allergies or adverse reactions: No Arrival Time: 11:14 Had a fall or experienced change in No Accompanied By: self activities of daily living that may affect Transfer Assistance: None risk of falls: Patient Identification Verified: Yes Signs or symptoms of abuse/neglect since last visito No Secondary Verification Process Completed: Yes Hospitalized since last visit: No Patient Requires Transmission-Based Precautions: No Implantable device outside of the clinic excluding No Patient Has Alerts: Yes cellular tissue based products placed in the center Patient Alerts: R ABI= N/C, TBI= .75 since last visit: L ABI =N/C, TBI = .57 Pain Present Now: No Electronic Signature(s) Signed: 01/18/2023 3:30:06 PM By: Redmond Pulling RN, BSN Entered By: Redmond Pulling on 01/18/2023 11:15:02 -------------------------------------------------------------------------------- Compression Therapy Details Patient Name: Date of Service: [name] 01/18/2023 11:15 A M Medical Record Number: [id number] Patient Account Number: [id number] Date of Birth/Sex: Treating RN: [birth year] (72 y.o. Cline Cools Primary Care Cage Gupton: [name] Other Clinician: Referring Demontre Padin: Treating Sarina Robleto/Extender: [name] in Treatment: 27 Compression Therapy Performed for Wound  Assessment: Wound #3 Right,Medial Lower Leg Performed By: Clinician Redmond Pulling, RN Compression Type: Three Emergency planning/management officer) Signed: 01/18/2023 3:30:06 PM By: Redmond Pulling RN, BSN Entered By: Redmond Pulling on 01/18/2023 11:37:53 -------------------------------------------------------------------------------- Compression Therapy Details Patient Name: Date of Service: [name], [name] 01/18/2023 11:15 A M Medical Record Number: [id number] Patient Account Number: [id number] Date of Birth/Sex: Treating RN: [birth year] (72 y.o. Cline Cools Primary Care Komal Stangelo: [name] Other Clinician: [name] ([id number]) [id number] Page 2 of 5 Referring Prosperity Darrough: Treating Ben Sanz/Extender: [name] in Treatment: 49 Compression Therapy Performed for Wound Assessment: Wound #6 Right,Anterior Lower Leg Performed By: Clinician Redmond Pulling, RN Compression Type: Three Emergency planning/management officer) Signed: 01/18/2023 3:30:06 PM By: Redmond Pulling RN, BSN Entered By: Redmond Pulling on 01/18/2023 11:37:53 -------------------------------------------------------------------------------- Encounter Discharge Information Details Patient Name: Date of Service: [name], [name] [name] 01/18/2023 11:15 A M Medical Record Number: [id number] Patient Account Number: [id number] Date of Birth/Sex: Treating RN: [birth year] (72 y.o. Cline Cools Primary Care Chancey Ringel: [name] Other Clinician: Referring Arlynn Mcdermid: Treating Keairra Bardon/Extender: [name] in Treatment: 55 Encounter Discharge Information Items Discharge Condition: Stable Ambulatory Status: Wheelchair Discharge Destination: Home Transportation: Private Auto Accompanied By: self Schedule Follow-up Appointment: Yes Clinical Summary of Care: Patient Declined Electronic Signature(s) Signed: 01/18/2023 3:30:06 PM By: Redmond Pulling RN,  BSN Entered By: Redmond Pulling on 01/18/2023 11:38:41 -------------------------------------------------------------------------------- Patient/Caregiver Education Details Patient Name: Date of Service: [name], [name] 6/25/2024andnbsp11:15 A M Medical Record Number: [id number] Patient Account Number: [id number] Date of Birth/Gender: Treating RN: [birth year] (72 y.o. Cline Cools Primary Care Physician: [name] Other Clinician: Referring Physician: Treating Physician/Extender: [name] in Treatment: 65 Education Assessment Education Provided To: Patient Education Topics Provided Wound/Skin Impairment: Methods: Explain/Verbal Responses: State content correctly [employer]) Signed: 01/18/2023 3:30:06 PM By: Redmond Pulling RN, BSN [place], [name] ([id number]) [id number] Page 3 of 5 Entered By: Redmond Pulling on 01/18/2023 11:38:23 -------------------------------------------------------------------------------- Wound Assessment Details Patient  Name: Date of Service: [name], [name] 01/18/2023 11:15 A M Medical Record Number: [id number] Patient Account Number: [id number] Date of Birth/Sex: Treating RN: 1950/12/05 (72 y.o. Cline Cools Primary Care Aubreanna Percle: [name] Other Clinician: Referring Lonna Rabold: Treating Tyla Burgner/Extender: [name] in Treatment: 49 Wound Status Wound Number: 3 Primary Lymphedema Etiology: Wound Location: Right, Medial Lower Leg Wound Open Wounding Event: Gradually Appeared Status: Date Acquired: 12/07/2021 Comorbid Cataracts, Anemia, Lymphedema, Congestive Heart Failure, Deep Weeks Of Treatment: 49 History: Vein Thrombosis, Hypertension, Peripheral Venous Disease Clustered Wound: No Wound Measurements Length: (cm) 9.4 Width: (cm) 7.5 Depth: (cm) 0.4 Area: (cm) 55.371 Volume: (cm) 22.148 % Reduction in Area: -936.7% % Reduction in Volume:  -936.9% Epithelialization: Small (1-33%) Wound Description Classification: Full Thickness With Exposed Suppo Wound Margin: Flat and Intact Exudate Amount: Large Exudate Type: Serous Exudate Color: amber rt Structures Foul Odor After Cleansing: No Slough/Fibrino Yes Wound Bed Granulation Amount: Medium (34-66%) Exposed Structure Granulation Quality: Red Fascia Exposed: No Necrotic Amount: Medium (34-66%) Fat Layer (Subcutaneous Tissue) Exposed: Yes Necrotic Quality: Adherent Slough Tendon Exposed: No Muscle Exposed: No Joint Exposed: No Bone Exposed: No Periwound Skin Texture Texture Color No Abnormalities Noted: No No Abnormalities Noted: No Excoriation: Yes Hemosiderin Staining: Yes Moisture Temperature / Pain No Abnormalities Noted: Yes Temperature: No Abnormality Tenderness on Palpation: Yes Treatment Notes Wound #3 (Lower Leg) Wound Laterality: Right, Medial Cleanser Soap and Water Discharge Instruction: May shower and wash wound with dial antibacterial soap and water prior to dressing change. Vashe 5.8 (oz) Discharge Instruction: Cleanse the wound with Vashe prior to applying a clean dressing using gauze sponges, not tissue or cotton balls. Peri-Wound Care Ketoconazole Cream 2% Discharge Instruction: Apply Ketoconazole mixed with zinc to irritated periwound skin Zinc Oxide Ointment 30g tube Discharge Instruction: Apply Zinc Oxide to periwound with each dressing change SEBASTIN, PERLMUTTER ([id number]) [phone].pdf Page 4 of 5 Sween Lotion (Moisturizing lotion) Discharge Instruction: Apply moisturizing lotion as directed Topical Gentamicin Discharge Instruction: mixed 50:50 with mupirocin to wound bed Mupirocin Ointment Discharge Instruction: Apply Mupirocin (Bactroban) as instructed Primary Dressing Hydrofera Blue Ready Transfer Foam, 4x5 (in/in) Discharge Instruction: Apply to wound bed if urgo clean unavailable UrgoClean AG 4 X 5  (in/in) Discharge Instruction: Apply directly to wound bed. Secondary Dressing ABD Pad, 8x10 Discharge Instruction: Apply over primary dressing as directed. Zetuvit Plus 4x8 in Discharge Instruction: Apply over primary dressing as directed. Secured With Compression Wrap ThreePress (3 layer compression wrap) Discharge Instruction: Apply three layer compression as directed. Compression Stockings Add-Ons Electronic Signature(s) Signed: 01/18/2023 3:30:06 PM By: Redmond Pulling RN, BSN Entered By: Redmond Pulling on 01/18/2023 11:15:40 -------------------------------------------------------------------------------- Wound Assessment Details Patient Name: Date of Service: HENNING, EHLE 01/18/2023 11:15 A M Medical Record Number: [id number] Patient Account Number: [id number] Date of Birth/Sex: Treating RN: Feb 28, 1951 (72 y.o. Cline Cools Primary Care Copeland Lapier: [name] Other Clinician: Referring Julina Altmann: Treating Rihan Schueler/Extender: [name] in Treatment: 49 Wound Status Wound Number: 6 Primary Calcinosis Etiology: Wound Location: Right, Anterior Lower Leg Wound Open Wounding Event: Gradually Appeared Status: Date Acquired: 05/12/2022 Comorbid Cataracts, Anemia, Lymphedema, Congestive Heart Failure, Deep Weeks Of Treatment: 35 History: Vein Thrombosis, Hypertension, Peripheral Venous Disease Clustered Wound: Yes Wound Measurements Length: (cm) Width: (cm) Depth: (cm) Clustered Quantity: Area: (cm) Volume: (cm) 1.5 % Reduction in Area: -99.6% 0.4 % Reduction in Volume: -1275% 0.7 Epithelialization: Small (1-33%) 2 0.471 0.33 Wound Description Classification: Full Thickness Without Exposed Support Structures Wound Margin: Epibole Exudate Amount: Medium  Exudate Type: Serosanguineous Hinnant, Sadao ([id number]) Exudate Color: red, brown Foul Odor After Cleansing: No Slough/Fibrino Yes [id number].pdf Page 5 of  5 Wound Bed Granulation Amount: Medium (34-66%) Exposed Structure Granulation Quality: Red Fascia Exposed: No Necrotic Amount: Small (1-33%) Fat Layer (Subcutaneous Tissue) Exposed: Yes Necrotic Quality: Adherent Slough Tendon Exposed: No Muscle Exposed: No Joint Exposed: No Bone Exposed: No Periwound Skin Texture Texture Color No Abnormalities Noted: Yes No Abnormalities Noted: No Hemosiderin Staining: Yes Moisture No Abnormalities Noted: Yes Temperature / Pain Temperature: No Abnormality Treatment Notes Wound #6 (Lower Leg) Wound Laterality: Right, Anterior Cleanser Peri-Wound Care Sween Lotion (Moisturizing lotion) Discharge Instruction: Apply moisturizing lotion as directed Topical Primary Dressing Maxorb Extra Ag+ Alginate Dressing, 2x2 (in/in) Discharge Instruction: Apply to wound bed as instructed Secondary Dressing Woven Gauze Sponge, Non-Sterile 4x4 in Discharge Instruction: Apply over primary dressing as directed. Secured With Compression Wrap ThreePress (3 layer compression wrap) Discharge Instruction: Apply three layer compression as directed. Compression Stockings Add-Ons Electronic Signature(s) Signed: 01/18/2023 3:30:06 PM By: Redmond Pulling RN, BSN Entered By: Redmond Pulling on 01/18/2023 11:15:47

## 2023-01-21 ENCOUNTER — Encounter (HOSPITAL_BASED_OUTPATIENT_CLINIC_OR_DEPARTMENT_OTHER): Payer: Medicare Other | Admitting: General Surgery

## 2023-01-21 DIAGNOSIS — L97812 Non-pressure chronic ulcer of other part of right lower leg with fat layer exposed: Secondary | ICD-10-CM | POA: Diagnosis not present

## 2023-01-21 DIAGNOSIS — I890 Lymphedema, not elsewhere classified: Secondary | ICD-10-CM | POA: Diagnosis not present

## 2023-01-21 DIAGNOSIS — I872 Venous insufficiency (chronic) (peripheral): Secondary | ICD-10-CM | POA: Diagnosis not present

## 2023-01-21 DIAGNOSIS — I509 Heart failure, unspecified: Secondary | ICD-10-CM | POA: Diagnosis not present

## 2023-01-21 DIAGNOSIS — I2510 Atherosclerotic heart disease of native coronary artery without angina pectoris: Secondary | ICD-10-CM | POA: Diagnosis not present

## 2023-01-21 DIAGNOSIS — I110 Hypertensive heart disease with heart failure: Secondary | ICD-10-CM | POA: Diagnosis not present

## 2023-01-21 DIAGNOSIS — L942 Calcinosis cutis: Secondary | ICD-10-CM | POA: Diagnosis not present

## 2023-01-21 NOTE — Progress Notes (Addendum)
[name], [name] ([id number]) [id number] Page 1 of 16 Visit Report for 01/21/2023 Chief Complaint Document Details Patient Name: Date of Service: [name], [name] 01/21/2023 12:30 PM Medical Record Number: [id number] Patient Account Number: [id number] Date of Birth/Sex: Treating RN: 09-24-1950 (72 y.o. M) Primary Care Provider: [name] Other Clinician: Referring Provider: Treating Provider/Extender: [name] in Treatment: 8 Information Obtained from: Patient Chief Complaint Patient seen for complaints of Non-Healing Wounds. Electronic Signature(s) Signed: 01/21/2023 12:49:22 PM By: Duanne Guess MD FACS Entered By: Duanne Guess on 01/21/2023 12:49:22 -------------------------------------------------------------------------------- Debridement Details Patient Name: Date of Service: [name], [name] 01/21/2023 12:30 PM Medical Record Number: [id number] Patient Account Number: [id number] Date of Birth/Sex: Treating RN: 1951-05-17 (72 y.o. [name] Primary Care Provider: [name] Other Clinician: Referring Provider: Treating Provider/Extender: [name] in Treatment: 49 Debridement Performed for Assessment: Wound #3 Right,Medial Lower Leg Performed By: Physician Duanne Guess, MD Debridement Type: Debridement Level of Consciousness (Pre-procedure): Awake and Alert Pre-procedure Verification/Time Out Yes - 12:52 Taken: Start Time: 12:52 Pain Control: Lidocaine 4% T opical Solution Percent of Wound Bed Debrided: 75% T Area Debrided (cm): otal 33.21 Tissue and other material debrided: Non-Viable, Slough, Slough Level: Non-Viable Tissue Debridement Description: Selective/Open Wound Instrument: Curette Bleeding: Minimum Hemostasis Achieved: Pressure Response to Treatment: Procedure was tolerated well Level of Consciousness (Post- Awake and Alert procedure): Post  Debridement Measurements of Total Wound Length: (cm) 9.4 Width: (cm) 6 Depth: (cm) 0.2 Volume: (cm) 8.859 Character of Wound/Ulcer Post Debridement: Improved Post Procedure Diagnosis Same as [name] ([id number]) [phone].pdf Page 2 of 16 Notes Scribed for Dr [name] by [name], RN Electronic Signature(s) Signed: 01/21/2023 1:51:59 PM By: Duanne Guess MD FACS Signed: 01/21/2023 3:50:53 PM By: Gelene Mink By: [name] on 01/21/2023 12:56:00 -------------------------------------------------------------------------------- Debridement Details Patient Name: Date of Service: [name], [name] 01/21/2023 12:30 PM Medical Record Number: [id number] Patient Account Number: [id number] Date of Birth/Sex: Treating RN: 1950-10-20 (72 y.o. [name] Primary Care Provider: [name] Other Clinician: Referring Provider: Treating Provider/Extender: [name] in Treatment: 49 Debridement Performed for Assessment: Wound #6 Right,Anterior Lower Leg Performed By: Physician Duanne Guess, MD Debridement Type: Debridement Level of Consciousness (Pre-procedure): Awake and Alert Pre-procedure Verification/Time Out Yes - 12:52 Taken: Start Time: 12:52 Pain Control: Lidocaine 4% T opical Solution Percent of Wound Bed Debrided: 100% T Area Debrided (cm): otal 1.96 Tissue and other material debrided: Non-Viable, Slough, Slough Level: Non-Viable Tissue Debridement Description: Selective/Open Wound Instrument: Curette Bleeding: Minimum Hemostasis Achieved: Pressure Response to Treatment: Procedure was tolerated well Level of Consciousness (Post- Awake and Alert procedure): Post Debridement Measurements of Total Wound Length: (cm) 2.5 Width: (cm) 1 Depth: (cm) 0.5 Volume: (cm) 0.982 Character of Wound/Ulcer Post Debridement: Improved Post Procedure Diagnosis Same as  Pre-procedure Notes Scribed for Dr [name] by [name], RN Electronic Signature(s) Signed: 01/21/2023 1:51:59 PM By: Duanne Guess MD FACS Signed: 01/21/2023 3:50:53 PM By: [name] Entered By: [name] on 01/21/2023 12:56:25 -------------------------------------------------------------------------------- HPI Details Patient Name: Date of Service: [name] 01/21/2023 12:30 PM Medical Record Number: [id number] Patient Account Number: [id number] [name], [name] ([id number]) [id number] Page 3 of 16 Date of Birth/Sex: Treating RN: 09-17-1950 (72 y.o. M) Primary Care Provider: Other Clinician: [name] Referring Provider: Treating Provider/Extender: [name] in Treatment: 58 History of Present Illness HPI Description: ADMISSION 02/09/2022 This is a 72 year old man who recently moved to [place] from [place]. He has a  history of of coronary artery disease and prior left popliteal vein DVT . He is not diabetic and does not smoke. 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 He has had multiple spinal surgeries and has limited mobility. He primarily uses a motorized wheelchair. He has had lymphedema for many years and does have lymphedema pumps although he says he does not use them frequently. He has wounds on his bilateral lower extremities. He was receiving care in [place] for these prior to his move. They were apparently packing his wounds with Betadine soaked gauze. He has worn compression wraps in the past but not recently. He did say that they helped tremendously. In clinic today, his right ABI is noncompressible but has good signals; the left ABI was 1.17. No formal vascular studies have been performed. On his left dorsal foot, there is a circular lesion that exposes the fat layer. There is fairly heavy slough accumulation within the wound, but no significant odor. On his right  medial calf, he has multiple pinholes that have slough buildup in them and probe for about 2 to 4 mm in depth. They are draining clear fluid. On his right lateral midfoot, he has ulceration consistent with venous stasis. It is geographic and has slough accumulation. 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 02/17/2022: All of the wounds are little bit larger today and have more slough accumulation. Today, the medial calf openings are larger and probe to something that feels calcified. There is odor coming from his wounds. His vascular studies are scheduled for August 9 and 10. 02-24-2022 upon evaluation today patient presents for follow-up concerning his ongoing issues here with his lower extremities. With that being said he does have significant problems with what appears to be cellulitis unfortunately the PCR culture that was sent last week got crushed by UPS in transit. With that being said we are going to reobtain a culture today although I am actually to do it on the right medial leg as this seems to be the most inflamed area today where there is some questionable drainage as well. I am going to remove some of the calcium deposits which are loosening obtain a deeper culture from this area. Fortunately I do not see any evidence of active infection systemically which is good news but locally I think we are having a bigger issue here. He does have his appointment next Thursday with vascular. Patient has also been referred to [facility] for further evaluation and treatment of the calcinosis for possible sulfate injections. 03/04/2022: The culture that was performed last week grew out multiple species including Klebsiella, Morganella, and Pseudomonas aeruginosa. He had been prescribed Bactrim but this was not adequate to cover all of the species and levofloxacin was recommended. He completed the Bactrim but did not  pick up the levofloxacin and begin taking it until yesterday. 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 He had arterial studies performed today. He does have evidence of peripheral arterial disease. Results copied here: +-------+-----------+-----------+------------+------------+ ABI/TBIT oday's ABIT oday's TBIPrevious ABIPrevious TBI +-------+-----------+-----------+------------+------------+ Right [place] 0.75    +-------+-----------+-----------+------------+------------+ Left [place] 0.57    +-------+-----------+-----------+------------+------------+ Arterial wall calcification precludes accurate ankle pressures and ABIs. Summary: Right: Resting right ankle-brachial index indicates noncompressible right lower extremity arteries. The right toe-brachial  index is normal. Left: Resting left ankle-brachial index indicates noncompressible left lower extremity arteries. The left toe-brachial index is abnormal. He continues to have further tissue breakdown at the right medial calf and there is ample slough accumulation along with necrotic subcutaneous tissue. The left dorsal foot wound has built up slough, as well. The right medial foot wound is nearly closed with just a light layer of eschar over the surface. The right dorsal lateral foot wound has also accumulated a fair amount of slough and remains quite tender. 03/10/2022: He has been taking the prescribed levofloxacin and has about 3 days left of this. The erythema and induration on his right leg has improved. He continues to experience further breakdown of the right medial calf wound. There is extensive slough and debris accumulation there. There is heavy slough on his left dorsal foot wound, but no odor or purulent drainage. The right medial foot wound appears to be  closed. The right dorsal lateral foot wound also has a fair amount of slough but it is [name] tender than at our last visit. He still does not have an appointment with dermatology. 03/22/2022: The right anterior tibial wound has closed. The right lateral foot wound is nearly closed with just a little bit of slough. The left dorsal foot wound is smaller and continues to have some slough buildup but [name] so than at prior visits. There is good granulation tissue underlying the slough. Unfortunately the right medial calf wound continues to deteriorate. It has a foul odor today and a lot of necrotic tissue, the surrounding skin is also erythematous and moist. 03/30/2022: The right lateral foot wound continues to contract and just has a little bit of slough and eschar present. The left dorsal foot wound is also smaller with slough overlying good granulation tissue. The right medial calf wound actually looks quite a bit better today; there is no odor and there is much [name] necrotic tissue although some remains present. We have been using topical gentamicin and he has been taking oral Augmentin. 04/07/2022: The patient saw dermatology at [facility] last week. Unfortunately, it appears that they did not read any of the documentation that was provided. 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 Overall, it was a highly unsatisfactory consultation. In the meantime, however, we were able to identify compounding pharmacy who could create a topical sodium thiosulfate ointment. The patient has that with him today. Both of the right foot wounds are now closed. The left dorsal foot wound has contracted but still has a layer of slough on the surface. The  medial calf wounds on the right all look a little bit better. They still have heavy slough along with the chunks of calcium. Everything is stained purple. 04/14/2022: The wound on his dorsal left foot is a little bit smaller again today. There is still slough accumulation on the surface. The medial calf wounds have quite a bit [name] slough accumulation today. His right leg, however, is warm and erythematous, concerning for cellulitis. 04/14/2022: He completed his course of Augmentin yesterday. The medial calf wound sites are much cleaner today and shallower. There is [name] fragmented calcium in the wounds. He has a lot of lymphedema fluid-like drainage from these wounds, but no purulent discharge or  malodor. The wound on his dorsal left NADARIUS, SUNDERLIN ([id number]) [id number] Page 4 of 16 foot has also contracted and is shallower. There is a layer of slough over healthy granulation tissue. 05/05/2022: The left dorsal foot wound is smaller today with [name] slough and more granulation tissue. The right medial calf wounds are shallower, but have extensive slough and some fat necrosis. The drainage has diminished considerably, however. 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 05/12/2022: The left dorsal foot wound continues to contract and fill with granulation tissue. There is slough on the wound surface. The right medial calf wounds also continue to fill with healthy tissue, but there is remaining slough and fat necrosis present. He had a significant increase in his drainage this week and it was a bright yellow-green color. He also had a new wound open over his right anterior tibial surface associated with the spike of cutaneous calcium. The leg is more red, as well, concerning for infection. 05/19/2022: His culture returned positive for Pseudomonas. He is currently  taking a course of oral levofloxacin. We have also been using topical gentamicin mixed in with his sodium thiosulfate. All of the wounds look better this week. 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 There is slough and nonviable subcutaneous tissue still present. The new anterior tibial wound just has a light layer of slough. The dorsal foot wound also has some slough present and appears a little dry today. 05/26/2022: The right medial leg wound continues to improve. It is feeling with granulation tissue, but still accumulates a fairly thick layer of slough on the surface. The more recent anterior tibial wound has remained quite small, but also has some slough on the surface. The left dorsal foot wound has contracted somewhat and has perimeter epithelialization. He completed his oral levofloxacin. 06/02/2022: The left dorsal foot wound continues to improve significantly. There is just a little slough on the wound surface. The right medial leg wound had black drainage, but it looks like silver alginate was applied last week and I theorized that silver sulfide was created with a combination of the silver alginate and the sodium thiosulfate. It did, however, have a bit of an odor to it and extensive slough accumulation. The small anterior tibial wound also had a bit of slough and nonviable subcutaneous tissue present. The skin of his leg was rather macerated, as well. 06/09/2022: The left dorsal foot wound is smaller again this week with just a light layer of slough on the surface. The right medial leg wound looks substantially better. The leg and periwound skin are no longer red and macerated. There is good granulation tissue forming with [name] slough and nonviable tissue. The anterior tibial wound just has a small amount of slough accumulation. 06/16/2022: The left dorsal foot wound continues to contract. His wrap slipped a little bit, however, and his foot is more  swollen. The right medial leg wound continues to improve. The underlying tissue is more vital-appearing and there is [name] slough. He does have substantial drainage. The small anterior tibial wound has a bit of slough accumulation. 06/23/2022: The left dorsal foot wound is about half the size as it was last week. There is just some light slough on the surface and some eschar buildup around the edges. The right anterior leg wound is small with a little slough on the surface. Unfortunately, the  right medial leg wound deteriorated fairly substantially. It is malodorous with gray/green/black discoloration. 06/30/2022: The left dorsal foot wound continues to contract. It is nearly closed with just a light layer of slough present. The right anterior leg wound is about the same size and has a small amount of slough on the surface. The right medial leg wound looks a lot better than it did last week. The odor has abated and the tissue is [name] pulpy and necrotic-looking. There is still a fairly thick layer of slough present. 12/13; the left dorsal foot wound looks very healthy. There was no need to change the dressing here and no debridement. He has a small area on the right anterior lower leg apparently had not changed much in size. The area on the medial leg is the most problematic area this is large with some depth and a completely nonviable surface today. 07/14/2022: The left dorsal foot wound is smaller today with just a little slough and eschar present. The right anterior lower leg wound is about the same without any significant change. The medial leg wound has a black and green surface and is malodorous. No purulent drainage and the periwound skin is intact. Edema control is suboptimal. 07/21/2022: The dorsal foot wound continues to contract and is nearly closed with just a little bit of slough and eschar on the surface. The right anterior lower leg wound is shallower today but otherwise the dimensions  are unchanged. The medial leg wound looks significantly better although it still has a nonviable surface; the odor and black/green surface has resolved. His culture came back with multiple species sensitive to Augmentin, which was prescribed and a very low level of Pseudomonas, which should be handled by the topical gentamicin we have been using. 07/28/2022: The dorsal foot wound is down to just a very tiny opening with a little eschar on the surface. The right anterior tibial wound is about the same with some slough buildup. The right medial leg wound looks quite a bit better this week. There is thick rubbery slough on the surface, but the odor and drainage has improved significantly. 08/04/2022: The dorsal foot wound is almost healed, but the periwound skin has broken down. This appears to be secondary to moisture and adhesive from the absorbent pad. The right anterior tibial wound is shallower. The right medial leg wound continues to improve. It is smaller, still with rubbery slough on the surface. No malodor or purulent drainage. 08/11/2022: The dorsal foot wound is about the same size. It is a little bit dry with some slough accumulation. The right anterior tibial wound is also unchanged. The right medial leg wound is filling in further with good granulation tissue. Still with some slough buildup on the surface. 08/18/2022: His left leg is bright red, hot, and swollen with cellulitis. The dorsal foot wound actually looks quite good and is superficial with just a little bit of slough accumulation. The right anterior tibial wound is unchanged. The right medial leg wound has filled in with good granulation tissue but has copious amounts of drainage. 08/25/2022: His cellulitis has responded nicely to the oral antibiotics. His dorsal foot wound is smaller but has a fair amount of slough buildup. The right anterior tibial wound remains stable and unchanged. The right medial leg wound has more granulation  tissue under a layer of slough, but continues to have copious drainage. The drainage is actually causing skin irritation and threatens to result in breakdown. 09/01/2022: He continues to have profuse drainage  from his wounds which is resulting in tissue maceration on both the dorsum of his left foot and the periwound distal to his medial leg wound. The dorsal foot wound is nearly healed, despite this. The medial right leg wound is filling in with good granulation tissue. We are working on getting home health assistance to change his dressings more frequently to try and control the drainage better. 09/08/2022: We have had success in controlling his drainage. The tissue maceration has improved significantly and his swelling has come down quite markedly. The medial right leg wound is much more superficial and has substantially [name] nonviable tissue present. The anterior tibial wound is a little bit larger, however with some nonviable tissue present. 09/15/2022: The dorsal foot wound is nearly closed. The anterior tibial wound measures slightly larger today. The medial right leg wound continues to improve quite dramatically with [name] nonviable tissue and more robust-looking granulation. Edema control is significantly better. 09/22/2022: The left dorsal foot wound remains open, albeit quite tiny. The anterior tibial wound is a little bit larger again today. The medial right leg wound continues to improve with an increase in robust and healthy granulation tissue and [name] accumulation of nonviable tissue. 09/29/2022: His home health providers wrapped his left leg extremely tightly and he ended up having to cut the wrap off of his foot. This resulted in his foot being very swollen and that the dorsal foot wound is a little bit larger today. The anterior tibial wound is about the same size with a little slough on the surface. The medial right leg wound continues to fill in. There are very few areas with any  significant depth. There is still slough accumulation. 3/13; Patient has a left dorsal foot wound along with a right anterior tibial wound and right medial wound. We are using silver alginate to the left dorsal foot [name], [name] ([id number]) [id number] Page 5 of 16 wound, Santyl and sodium thiosulfate compound ointment to the right lower extremity wounds under 4 layer compression. Patient has no issues or complaints today. 10/13/2022: The left dorsal foot wound is a little bit bigger today, but his foot is also a little bit more swollen. The right medial leg wound has filled in more and has substantially [name] depth. There is still some nonviable tissue present. The small anterior lower leg wound looks about the same. 10/20/2022: The left dorsal foot wound is epithelializing. There does not appear to be much open at this site. There is some eschar around the edges. The right medial leg wound continues to fill in with granulation tissue. The small anterior lower leg wound is a little bit bigger and there is a chunk of calcium protruding. He continues to have fairly copious drainage from the right leg. 10/27/2022: His home health agency failed to show up at all this past week and as a result, the excessive drainage from his right leg wound has resulted in tissue breakdown. His leg is red and irritated. The wounds themselves look about the same. The wound on his left dorsal foot is nearly closed underneath some eschar. 11/03/2022: We continue to have difficulty with his home health agency. He continues to have significant drainage from his right leg wound which has resulted in even further tissue breakdown. He also was not taking his Lasix as prescribed and I think much of the drainage has been his lymphedema fluid choosing the path of least resistance. The wound on his left dorsal foot is closed, but in the process of  cleaning the site, dry skin was pulled off and he has a new  small superficial wound just adjacent to where the dorsal foot wound was. 11/12/2022: His left dorsal foot is completely healed. The right medial leg continues to deteriorate. He is not taking his Lasix as prescribed, nor is he using his lymphedema pumps at all. As a result, he has massive amounts of drainage saturating his dressings and causing further tissue breakdown on his medial leg. Today, his dressings had a foul odor, as well. 11/19/2022: His right medial leg looks even worse today. There is more erythema and moisture related tissue breakdown. The 2 anterior wounds look a little bit better. The culture that I took last week did produce Pseudomonas and he is currently taking levofloxacin for this. 11/26/2022: There has been significant improvement to his right medial leg. It is far [name] raw and many of the superficial open areas have epithelialized. He continues to cut the foot portion of his wrap and as result his foot is bulbous and swollen compared to the rest of his leg, where edema control is good. The smaller of the 2 anterior tibial wound is closed, while the larger is contracting somewhat with a bit of slough at the base. 12/03/2022: His leg continues to demonstrate improvement. There has been epithelialization of much of the denuded area. He has a new small anterior tibial wound that has opened around a nidus of calcium. Edema control is improved. 12/10/2022: Continued improvement in the periwound skin. The anterior tibial wounds are about the same, but the medial leg wound has improved further. There is [name] nonviable tissue present. 12/17/2022: Between his visit on Tuesday with the nurse and today, he has had significantly more drainage which has resulted in some periwound tissue maceration. I suspect this correlates somewhat with his diuretic use, as he says he takes his diuretic religiously on Saturday, Sunday, and Monday and then does not take it after Tuesday due to being out and about  and not being close to easy restroom access. I suspect the excess fluid is just simply draining out of his leg. 12/21/2022: This was supposed to be a nurse visit, but his leg has broken down so significantly, that we have converted to a wound care visit. He did take his diuretics over the weekend, but despite this, he has had copious drainage and all of his tissues have broken down substantially from the right medial leg ulcer all the way down to his ankle and even wrapping posterior to this. There is blue-green staining on his dressing, suggesting ongoing presence of Pseudomonas aeruginosa. 12/24/2022: The wound already looks better than it did 2 days ago. He has been taking ciprofloxacin and the Urgo clean dressing we applied did a much better job of wicking the drainage away from his leg. There is slough on the entire surface of the medial leg wound, but the erythema and induration has improved in the past couple of days. 12/31/2022: Continued improvement of the wound. He says that the pain has basically abated since being on ciprofloxacin. Still with fairly heavy drainage and slough accumulation. 01/07/2023: Overall continued improvement. Still with heavy slough buildup, but [name] drainage. 01/14/2023: There is a little bit of improvement. The skin is still looking a bit excoriated. He has a few more days left of ciprofloxacin. 01/21/2023: There has been significant improvement. The periwound skin is in much better condition. The ulcers are shallower and actually have some good granulation tissue present under some slough. Electronic  Signature(s) Signed: 01/21/2023 12:59:15 PM By: Duanne Guess MD FACS Entered By: Duanne Guess on 01/21/2023 12:59:15 -------------------------------------------------------------------------------- Physical Exam Details Patient Name: Date of Service: [name], [name] 01/21/2023 12:30 PM Medical Record Number: [id number] Patient Account Number: [id number] Date of  Birth/Sex: Treating RN: Feb 21, 1951 (72 y.o. M) Primary Care Provider: [name] Other Clinician: Referring Provider: Treating Provider/Extender: [name] in Treatment: 45 Constitutional Hypertensive, asymptomatic. . . . no acute distress. Respiratory [name], [name] ([id number]) [id number] Page 6 of 16 Normal work of breathing on room air. Notes 01/21/2023: There has been significant improvement. The periwound skin is in much better condition. The ulcers are shallower and actually have some good granulation tissue present under some slough. Electronic Signature(s) Signed: 01/21/2023 12:59:54 PM By: Duanne Guess MD FACS Entered By: Duanne Guess on 01/21/2023 12:59:54 -------------------------------------------------------------------------------- Physician Orders Details Patient Name: Date of Service: [name], [name] 01/21/2023 12:30 PM Medical Record Number: [id number] Patient Account Number: [id number] Date of Birth/Sex: Treating RN: Nov 18, 1950 (72 y.o. 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 - Dr. [name] RM 1 Return A Nurse Visit: - Tuesday RM 1 Anesthetic (In clinic) Topical Lidocaine 4% applied to wound bed Bathing/ Shower/ Hygiene May shower with protection  but do not get wound dressing(s) wet. Protect dressing(s) with water repellant cover (for example, large plastic bag) or a cast cover and may then take shower. Edema Control - Lymphedema / SCD / Other Lymphedema Pumps. Use Lymphedema pumps on leg(s) 2-3 times a day for 45-60 minutes. If wearing any wraps or hose, do not remove them. Continue exercising as instructed. Elevate legs to the level of the heart or above for 30 minutes daily and/or when sitting for 3-4 times a day throughout the day. - throughout the day Avoid standing for long periods of time. Compression stocking or Garment 20-30 mm/Hg pressure to: - juxtalite left leg daily Wound Treatment Wound #3 - Lower Leg Wound Laterality: Right, Medial Cleanser: Soap and Water 2 x Per Week/30 Days Discharge Instructions: May shower and wash wound with dial antibacterial soap and water prior to dressing change. Cleanser: Vashe 5.8 (oz) 2 x Per Week/30 Days Discharge Instructions: Cleanse the wound with Vashe prior to applying a clean dressing using gauze sponges, not tissue or cotton balls. Peri-Wound Care: Ketoconazole Cream 2% 2 x Per Week/30 Days Discharge Instructions: Apply Ketoconazole mixed with zinc to irritated periwound skin Peri-Wound Care: Zinc Oxide Ointment 30g tube 2 x Per Week/30 Days Discharge Instructions: Apply Zinc Oxide to periwound with each dressing change Peri-Wound Care: Sween Lotion (Moisturizing lotion) 2 x Per Week/30 Days Discharge Instructions: Apply moisturizing lotion as directed [name], [name] ([id number]) [id number] Page 7 of 16 Topical: Gentamicin 2 x Per Week/30 Days Discharge Instructions: mixed 50:50 with mupirocin to wound bed Topical: Mupirocin Ointment 2 x Per Week/30 Days Discharge Instructions: Apply Mupirocin (Bactroban) as instructed Prim Dressing: Hydrofera Blue Ready Transfer Foam, 4x5 (in/in) 2 x Per Week/30 Days ary Discharge Instructions: Apply to wound bed if  urgo clean  unavailable Prim Dressing: UrgoClean AG 4 X 5 (in/in) 2 x Per Week/30 Days ary Discharge Instructions: Apply directly to wound bed. Secondary Dressing: ABD Pad, 8x10 2 x Per Week/30 Days Discharge Instructions: Apply over primary dressing as directed. Secondary Dressing: Zetuvit Plus 4x8 in 2 x Per Week/30 Days Discharge Instructions: Apply over primary dressing as directed. Compression Wrap: ThreePress (3 layer compression wrap) 2 x Per Week/30 Days Discharge Instructions: Apply three layer compression as directed. Wound #6 - Lower Leg Wound Laterality: Right, Anterior Peri-Wound Care: Sween Lotion (Moisturizing lotion) 2 x Per Week/30 Days Discharge Instructions: Apply moisturizing lotion as directed Topical: Gentamicin 2 x Per Week/30 Days Discharge Instructions: As directed by physician Topical: Mupirocin Ointment 2 x Per Week/30 Days Discharge Instructions: Apply Mupirocin (Bactroban) as instructed Prim Dressing: UrgoClean AG 4 X 5 (in/in) 2 x Per Week/30 Days ary Discharge Instructions: Apply directly to wound bed. Secondary Dressing: Woven Gauze Sponge, Non-Sterile 4x4 in 2 x Per Week/30 Days Discharge Instructions: Apply over primary dressing as directed. Compression Wrap: ThreePress (3 layer compression wrap) 2 x Per Week/30 Days Discharge Instructions: Apply three layer compression as directed. Patient Medications llergies: No Known Allergies A Notifications Medication Indication Start End 01/21/2023 lidocaine DOSE topical 4 % cream - cream topical Electronic Signature(s) Signed: 01/21/2023 1:51:59 PM By: Duanne Guess MD FACS Entered By: Duanne Guess on 01/21/2023 13:05:53 -------------------------------------------------------------------------------- Problem List Details Patient Name: Date of Service: [name] 01/21/2023 12:30 PM Medical Record Number: [id number] Patient Account Number: [id number] Date of Birth/Sex: Treating RN: 23-Nov-1950 (72  y.o. M) Primary Care Provider: [name] Other Clinician: Referring Provider: Treating Provider/Extender: [name] in Treatment: [address] 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) 02/09/2022 No Yes I89.0 Lymphedema, not elsewhere classified 02/09/2022 No Yes I10 Essential (primary) hypertension 02/09/2022 No Yes I25.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 angina pectoris 02/09/2022 No Yes Z86.718 Personal history of other venous thrombosis and embolism 02/09/2022 No Yes Inactive Problems ICD-10 Code Description Active Date Inactive Date L97.512 Non-pressure chronic ulcer of other part of right foot with fat layer exposed 02/17/2022 02/17/2022 Resolved Problems ICD-10 Code Description Active Date Resolved Date L97.522 Non-pressure chronic ulcer of other part of left foot with fat layer exposed 02/09/2022 02/09/2022 Electronic Signature(s) Signed: 01/21/2023 12:48:59 PM By: Duanne Guess MD FACS Entered By: Duanne Guess on 01/21/2023 12:48:59 -------------------------------------------------------------------------------- Progress Note Details Patient Name: Date of Service: [name] 01/21/2023 12:30 PM Medical Record Number: [id number] Patient Account Number: [id number] Date of Birth/Sex: Treating RN: 28-Dec-1950 (72 y.o. M) Primary Care Provider: [name] Other Clinician: Referring Provider: Treating Provider/Extender: [name] in Treatment: 27 Subjective Chief Complaint Information obtained from Patient JUDIAH, PAWLEY ([id number]) [id number] Page 9 of  16 Patient seen for complaints of Non-Healing Wounds. History of Present Illness (HPI) ADMISSION 02/09/2022 This is a 72 year old man who recently moved to [place] from [place]. He has a history of of coronary artery disease and prior left popliteal vein DVT . He is not diabetic and does not smoke. 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 He has had multiple spinal surgeries and has limited mobility. He primarily uses a motorized wheelchair. He has had lymphedema for many years and  does have lymphedema pumps although he says he does not use them frequently. He has wounds on his bilateral lower extremities. He was receiving care in [place] for these prior to his move. They were apparently packing his wounds with Betadine soaked gauze. He has worn compression wraps in the past but not recently. He did say that they helped tremendously. In clinic today, his right ABI is noncompressible but has good signals; the left ABI was 1.17. No formal vascular studies have been performed. On his left dorsal foot, there is a circular lesion that exposes the fat layer. There is fairly heavy slough accumulation within the wound, but no significant odor. On his right medial calf, he has multiple pinholes that have slough buildup in them and probe for about 2 to 4 mm in depth. They are draining clear fluid. On his right lateral midfoot, he has ulceration consistent with venous stasis. It is geographic and has slough accumulation. 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 02/17/2022: All of the wounds are little bit larger today and have more slough accumulation. Today, the medial calf openings are larger and probe to something that feels calcified. There is odor coming from his wounds. His vascular studies are scheduled for August 9 and 10. 02-24-2022 upon evaluation today patient presents for follow-up  concerning his ongoing issues here with his lower extremities. With that being said he does have significant problems with what appears to be cellulitis unfortunately the PCR culture that was sent last week got crushed by [employer] in transit. With that being said we are going to reobtain a culture today although I am actually to do it on the right medial leg as this seems to be the most inflamed area today where there is some questionable drainage as well. I am going to remove some of the calcium deposits which are loosening obtain a deeper culture from this area. Fortunately I do not see any evidence of active infection systemically which is good news but locally I think we are having a bigger issue here. He does have his appointment next Thursday with vascular. Patient has also been referred to [facility] for further evaluation and treatment of the calcinosis for possible sulfate injections. 03/04/2022: The culture that was performed last week grew out multiple species including Klebsiella, Morganella, and Pseudomonas aeruginosa. He had been prescribed Bactrim but this was not adequate to cover all of the species and levofloxacin was recommended. He completed the Bactrim but did not pick up the levofloxacin and begin taking it until yesterday. 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 He had arterial studies performed today. He does have evidence of peripheral arterial disease. Results copied here: +-------+-----------+-----------+------------+------------+ ABI/TBIT oday's ABIT oday's TBIPrevious ABIPrevious TBI +-------+-----------+-----------+------------+------------+ Right [place] 0.75    +-------+-----------+-----------+------------+------------+ Left [place] 0.57     +-------+-----------+-----------+------------+------------+ Arterial wall calcification precludes accurate ankle pressures and ABIs. Summary: Right: Resting right ankle-brachial index indicates noncompressible right lower extremity arteries. The right toe-brachial index is normal. Left: Resting left ankle-brachial index indicates noncompressible left lower extremity arteries. The left toe-brachial index is abnormal. He continues to have further tissue breakdown at the right medial calf and there is ample slough accumulation along with necrotic subcutaneous tissue. The left dorsal foot wound has built up slough, as well. The right  medial foot wound is nearly closed with just a light layer of eschar over the surface. The right dorsal lateral foot wound has also accumulated a fair amount of slough and remains quite tender. 03/10/2022: He has been taking the prescribed levofloxacin and has about 3 days left of this. The erythema and induration on his right leg has improved. He continues to experience further breakdown of the right medial calf wound. There is extensive slough and debris accumulation there. There is heavy slough on his left dorsal foot wound, but no odor or purulent drainage. The right medial foot wound appears to be closed. The right dorsal lateral foot wound also has a fair amount of slough but it is [name] tender than at our last visit. He still does not have an appointment with dermatology. 03/22/2022: The right anterior tibial wound has closed. The right lateral foot wound is nearly closed with just a little bit of slough. The left dorsal foot wound is smaller and continues to have some slough buildup but [name] so than at prior visits. There is good granulation tissue underlying the slough. Unfortunately the right medial calf wound continues to deteriorate. It has a foul odor today and a lot of necrotic tissue, the surrounding skin is also erythematous and moist. 03/30/2022: The right  lateral foot wound continues to contract and just has a little bit of slough and eschar present. The left dorsal foot wound is also smaller with slough overlying good granulation tissue. The right medial calf wound actually looks quite a bit better today; there is no odor and there is much [name] necrotic tissue although some remains present. We have been using topical gentamicin and he has been taking oral Augmentin. 04/07/2022: The patient saw dermatology at [facility] last week. Unfortunately, it appears that they did not read any of the documentation that was provided. 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 Overall, it was a highly unsatisfactory consultation. In the meantime, however, we were able to identify compounding pharmacy who could create a topical sodium thiosulfate ointment. The patient has that with him today. Both of the right foot wounds are now closed. The left dorsal foot wound has contracted but still has a layer of slough on the surface. The medial calf wounds on the right all look a little bit better. They still have heavy slough along with the chunks of calcium. Everything is stained purple. 04/14/2022: The wound on his dorsal left foot is a little bit smaller again today. There is still slough accumulation on the surface. The medial calf wounds have quite a bit [name] slough accumulation today. His right leg, however, is warm and erythematous, concerning for cellulitis. 04/14/2022: He completed his course of Augmentin yesterday. The medial calf wound sites are much cleaner today and shallower. There is [name] fragmented calcium in the wounds. He has a lot of lymphedema fluid-like drainage from these wounds, but no  purulent discharge or malodor. The wound on his dorsal left foot has also contracted and is shallower. There is a layer of slough over healthy granulation tissue. 05/05/2022: The left dorsal foot wound is smaller today with [name] slough and more granulation tissue. The right medial calf wounds are shallower, but have extensive slough and some fat necrosis. The  drainage has diminished considerably, however. 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 MAZEN, PHO ([id number]) [id number] Page 10 of 16 05/12/2022: The left dorsal foot wound continues to contract and fill with granulation tissue. There is slough on the wound surface. The right medial calf wounds also continue to fill with healthy tissue, but there is remaining slough and fat necrosis present. He had a significant increase in his drainage this week and it was a bright yellow-green color. He also had a new wound open over his right anterior tibial surface associated with the spike of cutaneous calcium. The leg is more red, as well, concerning for infection. 05/19/2022: His culture returned positive for Pseudomonas. He is currently taking a course of oral levofloxacin. We have also been using topical gentamicin mixed in with his sodium thiosulfate. All of the wounds look better this week. 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 There is slough and nonviable subcutaneous tissue still present. The new anterior tibial wound just has a light layer of slough. The dorsal foot wound also has some slough present and appears a little dry today. 05/26/2022: The right medial leg wound continues to improve. It is feeling with granulation tissue, but still accumulates a fairly thick layer of slough on the surface. The more recent anterior tibial wound has remained quite small,  but also has some slough on the surface. The left dorsal foot wound has contracted somewhat and has perimeter epithelialization. He completed his oral levofloxacin. 06/02/2022: The left dorsal foot wound continues to improve significantly. There is just a little slough on the wound surface. The right medial leg wound had black drainage, but it looks like silver alginate was applied last week and I theorized that silver sulfide was created with a combination of the silver alginate and the sodium thiosulfate. It did, however, have a bit of an odor to it and extensive slough accumulation. The small anterior tibial wound also had a bit of slough and nonviable subcutaneous tissue present. The skin of his leg was rather macerated, as well. 06/09/2022: The left dorsal foot wound is smaller again this week with just a light layer of slough on the surface. The right medial leg wound looks substantially better. The leg and periwound skin are no longer red and macerated. There is good granulation tissue forming with [name] slough and nonviable tissue. The anterior tibial wound just has a small amount of slough accumulation. 06/16/2022: The left dorsal foot wound continues to contract. His wrap slipped a little bit, however, and his foot is more swollen. The right medial leg wound continues to improve. The underlying tissue is more vital-appearing and there is [name] slough. He does have substantial drainage. The small anterior tibial wound has a bit of slough accumulation. 06/23/2022: The left dorsal foot wound is about half the size as it was last week. There is just some light slough on the surface and some eschar buildup around the edges. The right anterior leg wound is small with a little slough on the surface. Unfortunately, the right medial leg wound deteriorated fairly substantially. It is malodorous with gray/green/black discoloration. 06/30/2022: The left dorsal foot wound continues to contract. It is nearly  closed with just a light layer of slough present. The right anterior leg wound is about the same size and has a small amount of slough on the surface. The right  medial leg wound looks a lot better than it did last week. The odor has abated and the tissue is [name] pulpy and necrotic-looking. There is still a fairly thick layer of slough present. 12/13; the left dorsal foot wound looks very healthy. There was no need to change the dressing here and no debridement. He has a small area on the right anterior lower leg apparently had not changed much in size. The area on the medial leg is the most problematic area this is large with some depth and a completely nonviable surface today. 07/14/2022: The left dorsal foot wound is smaller today with just a little slough and eschar present. The right anterior lower leg wound is about the same without any significant change. The medial leg wound has a black and green surface and is malodorous. No purulent drainage and the periwound skin is intact. Edema control is suboptimal. 07/21/2022: The dorsal foot wound continues to contract and is nearly closed with just a little bit of slough and eschar on the surface. The right anterior lower leg wound is shallower today but otherwise the dimensions are unchanged. The medial leg wound looks significantly better although it still has a nonviable surface; the odor and black/green surface has resolved. His culture came back with multiple species sensitive to Augmentin, which was prescribed and a very low level of Pseudomonas, which should be handled by the topical gentamicin we have been using. 07/28/2022: The dorsal foot wound is down to just a very tiny opening with a little eschar on the surface. The right anterior tibial wound is about the same with some slough buildup. The right medial leg wound looks quite a bit better this week. There is thick rubbery slough on the surface, but the odor and drainage has improved  significantly. 08/04/2022: The dorsal foot wound is almost healed, but the periwound skin has broken down. This appears to be secondary to moisture and adhesive from the absorbent pad. The right anterior tibial wound is shallower. The right medial leg wound continues to improve. It is smaller, still with rubbery slough on the surface. No malodor or purulent drainage. 08/11/2022: The dorsal foot wound is about the same size. It is a little bit dry with some slough accumulation. The right anterior tibial wound is also unchanged. The right medial leg wound is filling in further with good granulation tissue. Still with some slough buildup on the surface. 08/18/2022: His left leg is bright red, hot, and swollen with cellulitis. The dorsal foot wound actually looks quite good and is superficial with just a little bit of slough accumulation. The right anterior tibial wound is unchanged. The right medial leg wound has filled in with good granulation tissue but has copious amounts of drainage. 08/25/2022: His cellulitis has responded nicely to the oral antibiotics. His dorsal foot wound is smaller but has a fair amount of slough buildup. The right anterior tibial wound remains stable and unchanged. The right medial leg wound has more granulation tissue under a layer of slough, but continues to have copious drainage. The drainage is actually causing skin irritation and threatens to result in breakdown. 09/01/2022: He continues to have profuse drainage from his wounds which is resulting in tissue maceration on both the dorsum of his left foot and the periwound distal to his medial leg wound. The dorsal foot wound is nearly healed, despite this. The medial right leg wound is filling in with good granulation tissue. We are working on getting home health assistance to change  his dressings more frequently to try and control the drainage better. 09/08/2022: We have had success in controlling his drainage. The tissue  maceration has improved significantly and his swelling has come down quite markedly. The medial right leg wound is much more superficial and has substantially [name] nonviable tissue present. The anterior tibial wound is a little bit larger, however with some nonviable tissue present. 09/15/2022: The dorsal foot wound is nearly closed. The anterior tibial wound measures slightly larger today. The medial right leg wound continues to improve quite dramatically with [name] nonviable tissue and more robust-looking granulation. Edema control is significantly better. 09/22/2022: The left dorsal foot wound remains open, albeit quite tiny. The anterior tibial wound is a little bit larger again today. The medial right leg wound continues to improve with an increase in robust and healthy granulation tissue and [name] accumulation of nonviable tissue. 09/29/2022: His home health providers wrapped his left leg extremely tightly and he ended up having to cut the wrap off of his foot. This resulted in his foot being very swollen and that the dorsal foot wound is a little bit larger today. The anterior tibial wound is about the same size with a little slough on the surface. The medial right leg wound continues to fill in. There are very few areas with any significant depth. There is still slough accumulation. 3/13; Patient has a left dorsal foot wound along with a right anterior tibial wound and right medial wound. We are using silver alginate to the left dorsal foot wound, Santyl and sodium thiosulfate compound ointment to the right lower extremity wounds under 4 layer compression. Patient has no issues or complaints today. 10/13/2022: The left dorsal foot wound is a little bit bigger today, but his foot is also a little bit more swollen. The right medial leg wound has filled in more and has substantially [name] depth. There is still some nonviable tissue present. The small anterior lower leg wound looks about the same. DRAYK, PASSEY ([id number]) [id number] Page 11 of 16 10/20/2022: The left dorsal foot wound is epithelializing. There does not appear to be much open at this site. There is some eschar around the edges. The right medial leg wound continues to fill in with granulation tissue. The small anterior lower leg wound is a little bit bigger and there is a chunk of calcium protruding. He continues to have fairly copious drainage from the right leg. 10/27/2022: His home health agency failed to show up at all this past week and as a result, the excessive drainage from his right leg wound has resulted in tissue breakdown. His leg is red and irritated. The wounds themselves look about the same. The wound on his left dorsal foot is nearly closed underneath some eschar. 11/03/2022: We continue to have difficulty with his home health agency. He continues to have significant drainage from his right leg wound which has resulted in even further tissue breakdown. He also was not taking his Lasix as prescribed and I think much of the drainage has been his lymphedema fluid choosing the path of least resistance. The wound on his left dorsal foot is closed, but in the process of cleaning the site, dry skin was pulled off and he has a new small superficial wound just adjacent to where the dorsal foot wound was. 11/12/2022: His left dorsal foot is completely healed. The right medial leg continues to deteriorate. He is not taking his Lasix as prescribed, nor is he using his lymphedema pumps at  all. As a result, he has massive amounts of drainage saturating his dressings and causing further tissue breakdown on his medial leg. Today, his dressings had a foul odor, as well. 11/19/2022: His right medial leg looks even worse today. There is more erythema and moisture related tissue breakdown. The 2 anterior wounds look a little bit better. The culture that I took last week did produce Pseudomonas and he is currently  taking levofloxacin for this. 11/26/2022: There has been significant improvement to his right medial leg. It is far [name] raw and many of the superficial open areas have epithelialized. He continues to cut the foot portion of his wrap and as result his foot is bulbous and swollen compared to the rest of his leg, where edema control is good. The smaller of the 2 anterior tibial wound is closed, while the larger is contracting somewhat with a bit of slough at the base. 12/03/2022: His leg continues to demonstrate improvement. There has been epithelialization of much of the denuded area. He has a new small anterior tibial wound that has opened around a nidus of calcium. Edema control is improved. 12/10/2022: Continued improvement in the periwound skin. The anterior tibial wounds are about the same, but the medial leg wound has improved further. There is [name] nonviable tissue present. 12/17/2022: Between his visit on Tuesday with the nurse and today, he has had significantly more drainage which has resulted in some periwound tissue maceration. I suspect this correlates somewhat with his diuretic use, as he says he takes his diuretic religiously on Saturday, Sunday, and Monday and then does not take it after Tuesday due to being out and about and not being close to easy restroom access. I suspect the excess fluid is just simply draining out of his leg. 12/21/2022: This was supposed to be a nurse visit, but his leg has broken down so significantly, that we have converted to a wound care visit. He did take his diuretics over the weekend, but despite this, he has had copious drainage and all of his tissues have broken down substantially from the right medial leg ulcer all the way down to his ankle and even wrapping posterior to this. There is blue-green staining on his dressing, suggesting ongoing presence of Pseudomonas aeruginosa. 12/24/2022: The wound already looks better than it did 2 days ago. He has been  taking ciprofloxacin and the Urgo clean dressing we applied did a much better job of wicking the drainage away from his leg. There is slough on the entire surface of the medial leg wound, but the erythema and induration has improved in the past couple of days. 12/31/2022: Continued improvement of the wound. He says that the pain has basically abated since being on ciprofloxacin. Still with fairly heavy drainage and slough accumulation. 01/07/2023: Overall continued improvement. Still with heavy slough buildup, but [name] drainage. 01/14/2023: There is a little bit of improvement. The skin is still looking a bit excoriated. He has a few more days left of ciprofloxacin. 01/21/2023: There has been significant improvement. The periwound skin is in much better condition. The ulcers are shallower and actually have some good granulation tissue present under some slough. Patient History Information obtained from Patient, Chart. Family History Cancer - Mother, Heart Disease - Father,Mother, Hypertension - Father,Siblings, Lung Disease - Siblings, No family history of Diabetes, Hereditary Spherocytosis, Kidney Disease, Seizures, Stroke, Thyroid Problems, Tuberculosis. Social History Former smoker - quit 1991, Marital Status - Married, Alcohol Use - Moderate, Drug Use - No History,  Caffeine Use - Daily - coffee. Medical History Eyes Patient has history of Cataracts - right eye removed Denies history of Glaucoma, Optic Neuritis Ear/Nose/Mouth/Throat Denies history of Chronic sinus problems/congestion, Middle ear problems Hematologic/Lymphatic Patient has history of Anemia - macrocytic, Lymphedema Cardiovascular Patient has history of Congestive Heart Failure, Deep Vein Thrombosis - left leg, Hypertension, Peripheral Venous Disease Endocrine Denies history of Type I Diabetes, Type II Diabetes Genitourinary Denies history of End Stage Renal Disease Integumentary (Skin) Denies history of History of  Burn Oncologic Denies history of Received Chemotherapy, Received Radiation Psychiatric Denies history of Anorexia/bulimia, Confinement Anxiety Hospitalization/Surgery History - cervical fusion. - lumbar surgery. - coronary stent placement. - lasik eye surgery. - hydrocele excision. Medical A Surgical History Notes nd Constitutional Symptoms [facility]) obesity Ear/Nose/Mouth/Throat [name], [name] ([id number]) [id number] Page 12 of 16 hard of hearing Respiratory frozen diaphragm right Cardiovascular hyperlipidemia Musculoskeletal myositis, lumbar DDD, spinal stenosis, cervical facet joint syndrome Objective Constitutional Hypertensive, asymptomatic. no acute distress. Vitals Time Taken: 12:34 PM, Height: 71 in, Weight: 267 lbs, BMI: 37.2, Temperature: 99 F, Pulse: 68 bpm, Respiratory Rate: 18 breaths/min, Blood Pressure: 150/71 mmHg. Respiratory Normal work of breathing on room air. General Notes: 01/21/2023: There has been significant improvement. The periwound skin is in much better condition. The ulcers are shallower and actually have some good granulation tissue present under some slough. Integumentary (Hair, Skin) Wound #3 status is Open. Original cause of wound was Gradually Appeared. The date acquired was: 12/07/2021. The wound has been in treatment 49 weeks. The wound is located on the Right,Medial Lower Leg. The wound measures 9.4cm length x 6cm width x 0.2cm depth; 44.296cm^2 area and 8.859cm^3 volume. There is Fat Layer (Subcutaneous Tissue) exposed. There is no tunneling or undermining noted. There is a large amount of serous drainage noted. The wound margin is flat and intact. There is medium (34-66%) red granulation within the wound bed. There is a medium (34-66%) amount of necrotic tissue within the wound bed including Adherent Slough. The periwound skin appearance had no abnormalities noted for moisture. The periwound skin appearance  exhibited: Excoriation, Hemosiderin Staining. Periwound temperature was noted as No Abnormality. The periwound has tenderness on palpation. Wound #6 status is Open. Original cause of wound was Gradually Appeared. The date acquired was: 05/12/2022. The wound has been in treatment 36 weeks. The wound is located on the Right,Anterior Lower Leg. The wound measures 2.5cm length x 1cm width x 0.5cm depth; 1.963cm^2 area and 0.982cm^3 volume. There is Fat Layer (Subcutaneous Tissue) exposed. There is no tunneling or undermining noted. There is a medium amount of serosanguineous drainage noted. The wound margin is epibole. There is medium (34-66%) red granulation within the wound bed. There is a small (1-33%) amount of necrotic tissue within the wound bed including Adherent Slough. The periwound skin appearance had no abnormalities noted for texture. The periwound skin appearance had no abnormalities noted for moisture. The periwound skin appearance exhibited: Hemosiderin Staining. Periwound temperature was noted as No Abnormality. 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 There was a Selective/Open Wound Non-Viable Tissue Debridement with a total area of 33.21 sq cm performed by Duanne Guess,  MD. With the following instrument(s): Curette to remove Non-Viable tissue/material. Material removed includes [facility] after achieving pain control using Lidocaine 4% Topical Solution. No specimens were taken. A time out was conducted at 12:52, prior to the start  of the procedure. A Minimum amount of bleeding was controlled with Pressure. The procedure was tolerated well. Post Debridement Measurements: 9.4cm length x 6cm width x 0.2cm depth; 8.859cm^3 volume. Character of Wound/Ulcer Post Debridement is improved. Post procedure Diagnosis Wound #3: Same as Pre-Procedure General Notes: Scribed for Dr [name] by [name], RN. Pre-procedure diagnosis of Wound #3 is a Lymphedema located on the Right,Medial Lower Leg . There was a Three Layer Compression Therapy Procedure by [name], RN. Post procedure Diagnosis Wound #3: Same as Pre-Procedure Wound #6 ZERRICK, URWIN ([id number]) [id number] Page 13 of 16 Pre-procedure diagnosis of Wound #6 is a Calcinosis located on the Right,Anterior Lower Leg . There was a Selective/Open Wound Non-Viable Tissue Debridement with a total area of 1.96 sq cm performed by Duanne Guess, MD. With the following instrument(s): Curette to remove Non-Viable tissue/material. Material removed includes [facility] after achieving pain control using Lidocaine 4% Topical Solution. No specimens were taken. A time out was conducted at 12:52, prior to the start of the procedure. A Minimum amount of bleeding was controlled with Pressure. The procedure was tolerated well. Post Debridement Measurements: 2.5cm length x 1cm width x 0.5cm depth; 0.982cm^3 volume. Character of Wound/Ulcer Post Debridement is improved. Post procedure Diagnosis Wound #6: Same as Pre-Procedure General Notes: Scribed for Dr [name] by [name], RN. There was a Three Layer Compression Therapy Procedure by [name], RN. Post procedure Diagnosis Wound #: Same as Pre-Procedure Plan Follow-up Appointments: Return Appointment in 1 week. - Dr. [name] RM 1 Nurse Visit: - Tuesday RM 1 Anesthetic: (In clinic) Topical Lidocaine 4% applied to wound bed Bathing/ Shower/ Hygiene: May shower with protection but do not  get wound dressing(s) wet. Protect dressing(s) with water repellant cover (for example, large plastic bag) or a cast cover and may then take shower. Edema Control - Lymphedema / SCD / Other: Lymphedema Pumps. Use Lymphedema pumps on leg(s) 2-3 times a day for 45-60 minutes. If wearing any wraps or hose, do not remove them. Continue exercising as instructed. Elevate legs to the level of the heart or above for 30 minutes daily and/or when sitting for 3-4 times a day throughout the day. - throughout the day Avoid standing for long periods of time. Compression stocking or Garment 20-30 mm/Hg pressure to: - juxtalite left leg daily The following medication(s) was prescribed: lidocaine topical 4 % cream cream topical was prescribed at facility WOUND #3: - Lower Leg Wound Laterality: Right, Medial Cleanser: Soap and Water 2 x Per Week/30 Days Discharge Instructions: May shower and wash wound with dial antibacterial soap and water prior to dressing change. Cleanser: Vashe 5.8 (oz) 2 x Per Week/30 Days Discharge Instructions: Cleanse the wound with Vashe prior to applying a clean dressing using gauze sponges, not tissue or cotton balls. Peri-Wound Care: Ketoconazole Cream 2% 2 x Per Week/30 Days Discharge Instructions: Apply Ketoconazole mixed with zinc to irritated periwound skin Peri-Wound Care: Zinc Oxide Ointment 30g tube 2 x Per Week/30 Days Discharge Instructions: Apply Zinc Oxide to periwound with each dressing change Peri-Wound Care: Sween Lotion (Moisturizing lotion) 2 x Per Week/30 Days Discharge Instructions: Apply moisturizing lotion as directed Topical: Gentamicin 2 x Per Week/30 Days Discharge Instructions: mixed 50:50 with mupirocin to wound bed Topical: Mupirocin Ointment 2 x  Per Week/30 Days Discharge Instructions: Apply Mupirocin (Bactroban) as instructed Prim Dressing: Hydrofera Blue Ready Transfer Foam, 4x5 (in/in) 2 x Per Week/30 Days ary Discharge Instructions: Apply to wound  bed if urgo clean unavailable Prim Dressing: UrgoClean AG 4 X 5 (in/in) 2 x Per Week/30 Days ary Discharge Instructions: Apply directly to wound bed. Secondary Dressing: ABD Pad, 8x10 2 x Per Week/30 Days Discharge Instructions: Apply over primary dressing as directed. Secondary Dressing: Zetuvit Plus 4x8 in 2 x Per Week/30 Days Discharge Instructions: Apply over primary dressing as directed. Com pression Wrap: ThreePress (3 layer compression wrap) 2 x Per Week/30 Days Discharge Instructions: Apply three layer compression as directed. WOUND #6: - Lower Leg Wound Laterality: Right, Anterior Peri-Wound Care: Sween Lotion (Moisturizing lotion) 2 x Per Week/30 Days Discharge Instructions: Apply moisturizing lotion as directed Topical: Gentamicin 2 x Per Week/30 Days Discharge Instructions: As directed by physician Topical: Mupirocin Ointment 2 x Per Week/30 Days Discharge Instructions: Apply Mupirocin (Bactroban) as instructed Prim Dressing: UrgoClean AG 4 X 5 (in/in) 2 x Per Week/30 Days ary Discharge Instructions: Apply directly to wound bed. Secondary Dressing: Woven Gauze Sponge, Non-Sterile 4x4 in 2 x Per Week/30 Days Discharge Instructions: Apply over primary dressing as directed. Com pression Wrap: ThreePress (3 layer compression wrap) 2 x Per Week/30 Days Discharge Instructions: Apply three layer compression as directed. 01/21/2023: There has been significant improvement. The periwound skin is in much better condition. The ulcers are shallower and actually have some good granulation tissue present under some slough. I used a curette to debride slough from all of the wound surfaces. We will continue to apply a mixture of topical gentamicin and mupirocin to all of the wounds and a mixture of zinc oxide, TCA, and ketoconazole to the periwound. Continue Urgo clean as the contact layer with multiple absorbent layers and 3 layer compression equivalent. Electronic Signature(s) Signed:  01/21/2023 1:07:09 PM By: Duanne Guess MD FACS Binger, [name] ([id number]) [id number] Page 14 of 16 Entered By: Duanne Guess on 01/21/2023 13:07:09 -------------------------------------------------------------------------------- HxROS Details Patient Name: Date of Service: SPIROS, RUTIGLIANO 01/21/2023 12:30 PM Medical Record Number: [id number] Patient Account Number: [id number] Date of Birth/Sex: Treating RN: [birth year] (72 y.o. M) Primary Care Provider: [name] Other Clinician: Referring Provider: Treating Provider/Extender: [name] in Treatment: 37 Information Obtained From Patient Chart Constitutional Symptoms (General Health) Medical History: Past Medical History Notes: obesity Eyes Medical History: Positive for: Cataracts - right eye removed Negative for: Glaucoma; Optic Neuritis Ear/Nose/Mouth/Throat Medical History: Negative for: Chronic sinus problems/congestion; Middle ear problems Past Medical History Notes: hard of hearing Hematologic/Lymphatic Medical History: Positive for: Anemia - macrocytic; Lymphedema Respiratory Medical History: Past Medical History Notes: frozen diaphragm right Cardiovascular Medical History: Positive for: Congestive Heart Failure; Deep Vein Thrombosis - left leg; Hypertension; Peripheral Venous Disease Past Medical History Notes: hyperlipidemia Endocrine Medical History: Negative for: Type I Diabetes; Type II Diabetes Genitourinary Medical History: Negative for: End Stage Renal Disease Integumentary (Skin) Medical History: Negative for: History of Burn Musculoskeletal Medical History: Past Medical History Notes: myositis, lumbar DDD, spinal stenosis, cervical facet joint syndrome EDIE, BOTTI ([id number]) [id number] Page 15 of 16 Oncologic Medical History: Negative for: Received Chemotherapy; Received Radiation Psychiatric Medical  History: Negative for: Anorexia/bulimia; Confinement Anxiety HBO Extended History Items Eyes: Cataracts Immunizations Pneumococcal Vaccine: Received Pneumococcal Vaccination: Yes Received Pneumococcal Vaccination On or After 60th Birthday: Yes Implantable Devices No devices added Hospitalization / Surgery History Type of Hospitalization/Surgery cervical fusion lumbar surgery coronary stent placement  lasik eye surgery hydrocele excision Family and Social History Cancer: Yes - Mother; Diabetes: No; Heart Disease: Yes - Father,Mother; Hereditary Spherocytosis: No; Hypertension: Yes - Father,Siblings; Kidney Disease: No; Lung Disease: Yes - Siblings; Seizures: No; Stroke: No; Thyroid Problems: No; Tuberculosis: No; Former smoker - quit 1991; Marital Status - Married; Alcohol Use: Moderate; Drug Use: No History; Caffeine Use: Daily - coffee; Financial Concerns: No; Food, Clothing or Shelter Needs: No; Support System Lacking: No; Transportation Concerns: No Electronic Signature(s) Signed: 01/21/2023 1:51:59 PM By: Duanne Guess MD FACS Entered By: Duanne Guess on 01/21/2023 12:59:29 -------------------------------------------------------------------------------- SuperBill Details Patient Name: Date of Service: LANNY, VONWALD 01/21/2023 Medical Record Number: [id number] Patient Account Number: [id number] Date of Birth/Sex: Treating RN: 11-26-1950 (72 y.o. M) Primary Care Provider: [name] Other Clinician: Referring Provider: Treating Provider/Extender: [name] in Treatment: 49 Diagnosis Coding ICD-10 Codes Code Description [id number] 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) I89.0 Lymphedema, not elsewhere classified I10 Essential (primary) hypertension I25.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  angina pectoris Z86.718 Personal history of other venous thrombosis and embolism Facility Procedures LYNARD, BOITANO ([id number]): CPT4 Code Description [id number] [phone] - DEBRIDE WOUND 1ST 20 SQ CM OR < ICD-10 Diagnosis Description L97.812 Non-pressure chronic ulcer of other part of right lower leg with [id number] Page 16 of 16: Modifier Quantity 1 fat layer exposed WOODIE, LINDENMEYER ([id number]): [id number] - DEBRIDE WOUND EA ADDL 20 SQ CM ICD-10 Diagnosis Description L97.812 Non-pressure chronic ulcer of other part of right lower leg with [phone].pdf Page 16 of 16: 1 fat layer exposed JONTE, PARMAN ([id number]): [id number] (Facility Use Only) [phone] - APPLY MULTLAY COMPRS LWR LT LEG ICD-10 Diagnosis Description I89.0 Lymphedema, not elsewhere classified [id number] Page 16 of 16: 1 Physician Procedures : CPT4 Code Description Modifier [id number] - WC PHYS LEVEL 4 - EST PT 25 ICD-10 Diagnosis Description 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) I89.0 Lymphedema, not elsewhere classified Quantity: 1 : [id number] - WC PHYS DEBR WO ANESTH 20 SQ CM ICD-10 Diagnosis Description 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ed Quantity: 1 : [id number] 97598 - WC PHYS DEBR WO ANESTH EA ADD 20 CM ICD-10 Diagnosis Description 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ed Quantity: 1 Electronic Signature(s) Signed: 01/21/2023 1:51:59 PM By: Duanne Guess MD FACS Signed: 01/21/2023 3:50:53 PM By: [name] Previous Signature: 01/21/2023 1:07:32 PM Version By: Duanne Guess MD FACS Entered By: [name] on 01/21/2023 13:19:42

## 2023-01-21 NOTE — Progress Notes (Signed)
[name], [name] ([id number]) [id number] Page 1 of 10 Visit Report for 01/21/2023 Arrival Information Details Patient Name: Date of Service: [name], [name] 01/21/2023 12:30 PM Medical Record Number: [id number] Patient Account Number: [id number] Date of Birth/Sex: Treating RN: 14-Sep-1950 (72 y.o. [name] Primary Care Faiga Stones: [name] Other Clinician: Referring Kegan Mckeithan: Treating Melaney Tellefsen/Extender: [name] in Treatment: 63 Visit Information History Since Last Visit Added or deleted any medications: No Patient Arrived: Wheel Chair Any new allergies or adverse reactions: No Arrival Time: 12:34 Had a fall or experienced change in No Accompanied By: self activities of daily living that may affect Transfer Assistance: None risk of falls: Patient Identification Verified: Yes Signs or symptoms of abuse/neglect since last visito No Secondary Verification Process Completed: Yes Hospitalized since last visit: No Patient Requires Transmission-Based Precautions: No Implantable device outside of the clinic excluding No Patient Has Alerts: Yes cellular tissue based products placed in the center Patient Alerts: R ABI= N/C, TBI= .75 since last visit: L ABI =N/C, TBI = .57 Has Dressing in Place as Prescribed: Yes Has Compression in Place as Prescribed: Yes Pain Present Now: No Electronic Signature(s) Signed: 01/21/2023 3:50:53 PM By: [name] Entered By: [name] on 01/21/2023 12:34:52 -------------------------------------------------------------------------------- Compression Therapy Details Patient Name: Date of Service: [name], [name] 01/21/2023 12:30 PM Medical Record Number: [id number] Patient Account Number: [id number] Date of Birth/Sex: Treating RN: [birth year] (72 y.o. [name] Primary Care Aniayah Alaniz: [name] Other Clinician: Referring Aaminah Forrester: Treating Xylia Scherger/Extender:  [name] in Treatment: 49 Compression Therapy Performed for Wound Assessment: Wound #3 Right,Medial Lower Leg Performed By: Clinician [name], RN Compression Type: Three Layer Post Procedure Diagnosis Same as Pre-procedure Electronic Signature(s) Signed: 01/21/2023 3:50:53 PM By: [name] Entered By: [name] on 01/21/2023 12:52:47 [name] ([id number]) [id number] Page 2 of 10 -------------------------------------------------------------------------------- Compression Therapy Details Patient Name: Date of Service: [name], [name] 01/21/2023 12:30 PM Medical Record Number: [id number] Patient Account Number: [id number] Date of Birth/Sex: Treating RN: 1950-09-08 (72 y.o. [name] Primary Care Hersey [name]: [name] Other Clinician: Referring Delynn Olvera: Treating [name]/Extender: [name] in Treatment: 49 Compression Therapy Performed for Wound Assessment: NonWound Condition Lymphedema - Left Leg Performed By: Clinician [name], RN Compression Type: Three Layer Post Procedure Diagnosis Same as Pre-procedure Electronic Signature(s) Signed: 01/21/2023 3:50:53 PM By: [name] Entered By: [name] on 01/21/2023 12:57:15 -------------------------------------------------------------------------------- Encounter Discharge Information Details Patient Name: Date of Service: [name], [name] 01/21/2023 12:30 PM Medical Record Number: [id number] Patient Account Number: [id number] Date of Birth/Sex: Treating RN: 15-Dec-1950 (72 y.o. [name] Primary Care [name]: [name] Other Clinician: Referring [name]: Treating Mikeila Burgen/Extender: [name] in Treatment: 68 Encounter Discharge Information Items Post Procedure Vitals Discharge Condition: Stable Temperature (F): 99 Ambulatory  Status: Wheelchair Pulse (bpm): 68 Discharge Destination: Home Respiratory Rate (breaths/min): 18 Transportation: Private Auto Blood Pressure (mmHg): 150/71 Accompanied By: self Schedule Follow-up Appointment: Yes Clinical Summary of Care: Patient Declined Electronic Signature(s) Signed: 01/21/2023 3:50:53 PM By: [name] Entered By: [name] on 01/21/2023 13:19:22 -------------------------------------------------------------------------------- Lower Extremity Assessment Details Patient Name: Date of Service: [name], [name] 01/21/2023 12:30 PM Medical Record Number: [id number] Patient Account Number: [id number] Date of Birth/Sex: Treating RN: September 29, 1950 (72 y.o. [name] Primary Care Cougar Imel: [name] Other Clinician: Referring [name]: Treating [name]/Extender: [name] in Treatment: 49 Edema Assessment Assessed: [name]: No] [name]: No] Edema: [Left: Ye] [Right: s] Calf [name], [name] (  [id number]) [id number] Page 3 of 10 Left: Right: Point of Measurement: From Medial Instep 48.5 cm Ankle Left: Right: Point of Measurement: From Medial Instep 27.5 cm Vascular Assessment Pulses: Dorsalis Pedis Palpable: [Right:Yes] Electronic Signature(s) Signed: 01/21/2023 3:50:53 PM By: [name] Entered By: [name] on 01/21/2023 12:40:58 -------------------------------------------------------------------------------- Multi Wound Chart Details Patient Name: Date of Service: [name] 01/21/2023 12:30 PM Medical Record Number: [id number] Patient Account Number: [id number] Date of Birth/Sex: Treating RN: [birth year] (72 y.o. M) Primary Care Misha Antonini: [name] Other Clinician: Referring [name]: Treating [name]/Extender: [name] in Treatment: 54 Vital Signs Height(in): 71 Pulse(bpm): 68 Weight(lbs): 267 Blood Pressure(mmHg):  150/71 Body Mass Index(BMI): 37.2 Temperature(F): 99 Respiratory Rate(breaths/min): 18 [3:Photos:] [N/A:N/A] Right, Medial Lower Leg Right, Anterior Lower Leg N/A Wound Location: Gradually Appeared Gradually Appeared N/A Wounding Event: Lymphedema Calcinosis N/A Primary Etiology: Cataracts, Anemia, Lymphedema, Cataracts, Anemia, Lymphedema, N/A Comorbid History: Congestive Heart Failure, Deep Vein Congestive Heart Failure, Deep Vein Thrombosis, Hypertension, Peripheral Thrombosis, Hypertension, Peripheral Venous Disease Venous Disease 12/07/2021 05/12/2022 N/A Date Acquired: 13 36 N/A Weeks of Treatment: Open Open N/A Wound Status: No No N/A Wound Recurrence: No Yes N/A Clustered Wound: N/A 2 N/A Clustered Quantity: 9.4x6x0.2 2.5x1x0.5 N/A Measurements L x W x D (cm) 44.296 1.963 N/A A (cm) : rea 8.859 0.982 N/A Volume (cm) : -729.40% -731.80% N/A % Reduction in Area: -314.70% -3991.70% N/A % Reduction in Volume: Full Thickness With Exposed Support Full Thickness Without Exposed N/A Classification: Structures Support Structures Large Medium N/A Exudate Amount: Serous Serosanguineous N/A Exudate Type: amber red, brown N/A Exudate ColorSHANE, RIPPEON ([id number]) [id number] Page 4 of 10 Flat and Intact Epibole N/A Wound Margin: Medium (34-66%) Medium (34-66%) N/A Granulation Amount: Red Red N/A Granulation Quality: Medium (34-66%) Small (1-33%) N/A Necrotic Amount: Fat Layer (Subcutaneous Tissue): Yes Fat Layer (Subcutaneous Tissue): Yes N/A Exposed Structures: Fascia: No Fascia: No Tendon: No Tendon: No Muscle: No Muscle: No Joint: No Joint: No Bone: No Bone: No Small (1-33%) Small (1-33%) N/A Epithelialization: Excoriation: Yes No Abnormalities Noted N/A Periwound Skin Texture: No Abnormalities Noted No Abnormalities Noted N/A Periwound Skin Moisture: Hemosiderin Staining: Yes Hemosiderin Staining: Yes N/A Periwound  Skin Color: No Abnormality No Abnormality N/A Temperature: Yes N/A N/A Tenderness on Palpation: Treatment Notes Electronic Signature(s) Signed: 01/21/2023 12:49:12 PM By: Duanne Guess MD FACS Entered By: Duanne Guess on 01/21/2023 12:49:12 -------------------------------------------------------------------------------- Multi-Disciplinary Care Plan Details Patient Name: Date of Service: REEDER, PROA 01/21/2023 12:30 PM Medical Record Number: [id number] Patient Account Number: [id number] Date of Birth/Sex: Treating RN: 1951/03/04 (72 y.o. [name] Primary Care Allisa Einspahr: [name] Other Clinician: Referring Olin Gurski: Treating Baily Hovanec/Extender: [name] in Treatment: 39 Multidisciplinary Care Plan reviewed with physician Active Inactive Venous Leg Ulcer Nursing Diagnoses: Knowledge deficit related to disease process and management Potential for venous Insuffiency (use before diagnosis confirmed) Goals: Patient will maintain optimal edema control Date Initiated: 02/17/2022 Target Resolution Date: 02/23/2023 Goal Status: Active Interventions: Assess peripheral edema status every visit. Compression as ordered Provide education on venous insufficiency Treatment Activities: Non-invasive vascular studies : 02/17/2022 Therapeutic compression applied : 02/17/2022 Notes: Wound/Skin Impairment Nursing Diagnoses: Impaired tissue integrity Knowledge deficit related to ulceration/compromised skin integrity Goals: Patient/caregiver will verbalize understanding of skin care regimen Date Initiated: 02/17/2022 Target Resolution Date: 02/23/2023 GUADALUPE, NUSE ([id number]) [id number].pdf Page 5 of 10 Goal Status: Active Ulcer/skin breakdown will have a volume reduction of 30% by week 4 Date Initiated: 02/17/2022 Date Inactivated: 03/10/2022 Target Resolution Date:  03/10/2022 Unmet Reason: infection being treated, Goal  Status: Unmet waiting on derm appte Ulcer/skin breakdown will have a volume reduction of 50% by week 8 Date Initiated: 03/10/2022 Date Inactivated: 04/14/2022 Target Resolution Date: 04/07/2022 Goal Status: Unmet Unmet Reason: calcinosis Ulcer/skin breakdown will have a volume reduction of 80% by week 12 Date Initiated: 04/14/2022 Date Inactivated: 05/05/2022 Target Resolution Date: 05/05/2022 Unmet Reason: infection, chronic Goal Status: Unmet condition Interventions: Assess patient/caregiver ability to obtain necessary supplies Assess patient/caregiver ability to perform ulcer/skin care regimen upon admission and as needed Assess ulceration(s) every visit Provide education on ulcer and skin care Treatment Activities: Skin care regimen initiated : 02/17/2022 Topical wound management initiated : 02/17/2022 Notes: Electronic Signature(s) Signed: 01/21/2023 3:50:53 PM By: [name] Entered By: [name] on 01/21/2023 13:18:27 -------------------------------------------------------------------------------- Pain Assessment Details Patient Name: Date of Service: ADLER, LUDOVICO 01/21/2023 12:30 PM Medical Record Number: [id number] Patient Account Number: [id number] Date of Birth/Sex: Treating RN: 1951-04-23 (72 y.o. [name] Primary Care Mikle Sternberg: [name] Other Clinician: Referring Shearon Clonch: Treating Taytum Wheller/Extender: [name] in Treatment: 35 Active Problems Location of Pain Severity and Description of Pain Patient Has Paino No Site Locations Rate the pain. Current Pain Level: 0 Pain Management and Medication Current Pain Management: Electronic Signature(s) SOMA, ERICHSEN ([id number]) [id number] Page 6 of 10 Signed: 01/21/2023 3:50:53 PM By: Gelene Mink By: [name] on 01/21/2023  12:35:11 -------------------------------------------------------------------------------- Patient/Caregiver Education Details Patient Name: Date of Service: AAHAN, BARRESE 6/28/2024andnbsp12:30 PM Medical Record Number: [id number] Patient Account Number: [id number] Date of Birth/Gender: Treating RN: January 08, 1951 (72 y.o. [name] Primary Care Physician: [name] Other Clinician: Referring Physician: Treating Physician/Extender: [name] in Treatment: 33 Education Assessment Education Provided To: Patient Education Topics Provided Wound/Skin Impairment: Methods: Explain/Verbal Responses: Reinforcements needed, State content correctly Electronic Signature(s) Signed: 01/21/2023 3:50:53 PM By: [name] Entered By: [name] on 01/21/2023 13:18:45 -------------------------------------------------------------------------------- Wound Assessment Details Patient Name: Date of Service: [name], [name] 01/21/2023 12:30 PM Medical Record Number: [id number] Patient Account Number: [id number] Date of Birth/Sex: Treating RN: [birth year] (72 y.o. [name] Primary Care Kollen Armenti: [name] Other Clinician: Referring Jesper Stirewalt: Treating Zendaya Groseclose/Extender: [name] in Treatment: 49 Wound Status Wound Number: 3 Primary Lymphedema Etiology: Wound Location: Right, Medial Lower Leg Wound Open Wounding Event: Gradually Appeared Status: Date Acquired: 12/07/2021 Comorbid Cataracts, Anemia, Lymphedema, Congestive Heart Failure, Deep Weeks Of Treatment: 49 History: Vein Thrombosis, Hypertension, Peripheral Venous Disease Clustered Wound: No Photos [name], [name] ([id number]) [id number] Page 7 of 10 Wound Measurements Length: (cm) 9.4 Width: (cm) 6 Depth: (cm) 0.2 Area: (cm) 44.296 Volume: (cm) 8.859 % Reduction in Area: -729.4% % Reduction in Volume:  -314.7% Epithelialization: Small (1-33%) Tunneling: No Undermining: No Wound Description Classification: Full Thickness With Exposed Support Structures Wound Margin: Flat and Intact Exudate Amount: Large Exudate Type: Serous Exudate Color: amber Foul Odor After Cleansing: No Slough/Fibrino Yes Wound Bed Granulation Amount: Medium (34-66%) Exposed Structure Granulation Quality: Red Fascia Exposed: No Necrotic Amount: Medium (34-66%) Fat Layer (Subcutaneous Tissue) Exposed: Yes Necrotic Quality: Adherent Slough Tendon Exposed: No Muscle Exposed: No Joint Exposed: No Bone Exposed: No Periwound Skin Texture Texture Color No Abnormalities Noted: No No Abnormalities Noted: No Excoriation: Yes Hemosiderin Staining: Yes Moisture Temperature / Pain No Abnormalities Noted: Yes Temperature: No Abnormality Tenderness on Palpation: Yes Treatment Notes Wound #3 (Lower Leg) Wound Laterality: Right, Medial Cleanser Soap and Water Discharge Instruction: May shower and wash wound with dial antibacterial  soap and water prior to dressing change. Vashe 5.8 (oz) Discharge Instruction: Cleanse the wound with Vashe prior to applying a clean dressing using gauze sponges, not tissue or cotton balls. Peri-Wound Care Ketoconazole Cream 2% Discharge Instruction: Apply Ketoconazole mixed with zinc to irritated periwound skin Zinc Oxide Ointment 30g tube Discharge Instruction: Apply Zinc Oxide to periwound with each dressing change Sween Lotion (Moisturizing lotion) Discharge Instruction: Apply moisturizing lotion as directed Topical Gentamicin Discharge Instruction: mixed 50:50 with mupirocin to wound bed Mupirocin Ointment Discharge Instruction: Apply Mupirocin (Bactroban) as instructed Primary Dressing Hydrofera Blue Ready Transfer Foam, 4x5 (in/in) Discharge Instruction: Apply to wound bed if urgo clean unavailable UrgoClean AG 4 X 5 (in/in) Discharge Instruction: Apply directly to wound  bed. Secondary Dressing ABD Pad, 8x10 Discharge Instruction: Apply over primary dressing as directed. Zetuvit Plus 4x8 in Discharge Instruction: Apply over primary dressing as directed. Secured With [employer] ([id number]) [id number] Page 8 of 10 ThreePress (3 layer compression wrap) Discharge Instruction: Apply three layer compression as directed. Compression Stockings Add-Ons Electronic Signature(s) Signed: 01/21/2023 3:50:53 PM By: [name] Entered By: [name] on 01/21/2023 12:46:19 -------------------------------------------------------------------------------- Wound Assessment Details Patient Name: Date of Service: [name], [name] 01/21/2023 12:30 PM Medical Record Number: [id number] Patient Account Number: [id number] Date of Birth/Sex: Treating RN: 12-08-1950 (72 y.o. [name] Primary Care Tee Richeson: [name] Other Clinician: Referring Zedekiah Hinderman: Treating Yuepheng Schaller/Extender: [name] in Treatment: 49 Wound Status Wound Number: 6 Primary Calcinosis Etiology: Wound Location: Right, Anterior Lower Leg Wound Open Wounding Event: Gradually Appeared Status: Date Acquired: 05/12/2022 Comorbid Cataracts, Anemia, Lymphedema, Congestive Heart Failure, Deep Weeks Of Treatment: 36 History: Vein Thrombosis, Hypertension, Peripheral Venous Disease Clustered Wound: Yes Photos Wound Measurements Length: (cm) Width: (cm) Depth: (cm) Clustered Quantity: Area: (cm) Volume: (cm) 2.5 % Reduction in Area: -731.8% 1 % Reduction in Volume: -3991.7% 0.5 Epithelialization: Small (1-33%) 2 Tunneling: No 1.963 Undermining: No 0.982 Wound Description Classification: Full Thickness Without Exposed Sup Wound Margin: Epibole Exudate Amount: Medium Exudate Type: Serosanguineous Exudate Color: red, brown port Structures Foul Odor After Cleansing: No Slough/Fibrino Yes Wound  Bed Granulation Amount: Medium (34-66%) Exposed Structure Granulation Quality: Red Fascia Exposed: No Necrotic Amount: Small (1-33%) Fat Layer (Subcutaneous Tissue) Exposed: Yes Necrotic Quality: Adherent Slough Tendon Exposed: No Muscle Exposed: No Joint Exposed: No Bone Exposed: No [name], [name] ([id number]) [id number] Page 9 of 10 Periwound Skin Texture Texture Color No Abnormalities Noted: Yes No Abnormalities Noted: No Hemosiderin Staining: Yes Moisture No Abnormalities Noted: Yes Temperature / Pain Temperature: No Abnormality Treatment Notes Wound #6 (Lower Leg) Wound Laterality: Right, Anterior Cleanser Peri-Wound Care Sween Lotion (Moisturizing lotion) Discharge Instruction: Apply moisturizing lotion as directed Topical Gentamicin Discharge Instruction: As directed by physician Mupirocin Ointment Discharge Instruction: Apply Mupirocin (Bactroban) as instructed Primary Dressing UrgoClean AG 4 X 5 (in/in) Discharge Instruction: Apply directly to wound bed. Secondary Dressing Woven Gauze Sponge, Non-Sterile 4x4 in Discharge Instruction: Apply over primary dressing as directed. Secured With Compression Wrap ThreePress (3 layer compression wrap) Discharge Instruction: Apply three layer compression as directed. Compression Stockings Add-Ons Electronic Signature(s) Signed: 01/21/2023 3:50:53 PM By: [name] Entered By: [name] on 01/21/2023 12:46:43 -------------------------------------------------------------------------------- Vitals Details Patient Name: Date of Service: [name] 01/21/2023 12:30 PM Medical Record Number: [id number] Patient Account Number: [id number] Date of Birth/Sex: Treating RN: [birth year] (72 y.o. [name] Primary Care Tanita Palinkas: [name] Other Clinician: Referring Virgil Lightner: Treating Rolinda Impson/Extender: [name] in Treatment: 27 Vital  Signs Time Taken: 12:34 Temperature (F): 99 Height (in): 71 Pulse (bpm): 68 Weight (lbs): 267 Respiratory Rate (breaths/min): 18 Body Mass Index (BMI): 37.2 Blood Pressure (mmHg): 150/71 Reference Range: 80 - 120 mg / dl Electronic Signature(s) Signed: 01/21/2023 3:50:53 PM By: [name] Entered By: [name] on 01/21/2023 12:35:06 [name] ([id number]) [id number] Page 10 of 10

## 2023-01-25 ENCOUNTER — Encounter (HOSPITAL_BASED_OUTPATIENT_CLINIC_OR_DEPARTMENT_OTHER): Payer: Medicare Other | Attending: General Surgery | Admitting: General Surgery

## 2023-01-25 DIAGNOSIS — I509 Heart failure, unspecified: Secondary | ICD-10-CM | POA: Diagnosis not present

## 2023-01-25 DIAGNOSIS — L942 Calcinosis cutis: Secondary | ICD-10-CM | POA: Diagnosis not present

## 2023-01-25 DIAGNOSIS — I2510 Atherosclerotic heart disease of native coronary artery without angina pectoris: Secondary | ICD-10-CM | POA: Insufficient documentation

## 2023-01-25 DIAGNOSIS — I110 Hypertensive heart disease with heart failure: Secondary | ICD-10-CM | POA: Insufficient documentation

## 2023-01-25 DIAGNOSIS — I890 Lymphedema, not elsewhere classified: Secondary | ICD-10-CM | POA: Insufficient documentation

## 2023-01-25 DIAGNOSIS — L97812 Non-pressure chronic ulcer of other part of right lower leg with fat layer exposed: Secondary | ICD-10-CM | POA: Diagnosis not present

## 2023-01-25 DIAGNOSIS — Z86718 Personal history of other venous thrombosis and embolism: Secondary | ICD-10-CM | POA: Insufficient documentation

## 2023-01-25 DIAGNOSIS — Z87891 Personal history of nicotine dependence: Secondary | ICD-10-CM | POA: Diagnosis not present

## 2023-01-25 DIAGNOSIS — I872 Venous insufficiency (chronic) (peripheral): Secondary | ICD-10-CM | POA: Diagnosis not present

## 2023-01-25 NOTE — Progress Notes (Signed)
[name], [name] ([id number]) [id number] Page 1 of 5 Visit Report for 01/25/2023 Arrival Information Details Patient Name: Date of Service: [name], [name] 01/25/2023 11:15 A M Medical Record Number: [id number] Patient Account Number: [id number] Date of Birth/Sex: Treating RN: [birth year] (72 y.o. [name] Primary Care [name]: [name] Other Clinician: Referring [name]: Treating [name]/Extender: [name] in Treatment: 50 Visit Information History Since Last Visit Added or deleted any medications: No Patient Arrived: Wheel Chair Any new allergies or adverse reactions: No Arrival Time: 14:34 Had a fall or experienced change in No Accompanied By: self activities of daily living that may affect Transfer Assistance: None risk of falls: Patient Identification Verified: Yes Signs or symptoms of abuse/neglect since last visito No Secondary Verification Process Completed: Yes Hospitalized since last visit: No Patient Requires Transmission-Based Precautions: No Implantable device outside of the clinic excluding No Patient Has Alerts: Yes cellular tissue based products placed in the center Patient Alerts: R ABI= N/C, TBI= .75 since last visit: L ABI =N/C, TBI = .57 Has Dressing in Place as Prescribed: Yes Has Compression in Place as Prescribed: Yes Pain Present Now: No Electronic Signature(s) Signed: 01/25/2023 4:25:24 PM By: Redmond Pulling RN, BSN Entered By: Redmond Pulling on 01/25/2023 14:35:14 -------------------------------------------------------------------------------- Compression Therapy Details Patient Name: Date of Service: [name] 01/25/2023 11:15 A M Medical Record Number: [id number] Patient Account Number: [id number] Date of Birth/Sex: Treating RN: May 07, 1951 (72 y.o. [name] Primary Care [name]: [name] Other Clinician: Referring [name]: Treating [name]/Extender: [name] in Treatment: 50 Compression Therapy Performed for Wound Assessment: Wound #3 Right,Medial Lower Leg Performed By: Clinician Redmond Pulling, RN Compression Type: Four Layer Electronic Signature(s) Signed: 01/25/2023 4:25:24 PM By: Redmond Pulling RN, BSN Entered By: Redmond Pulling on 01/25/2023 14:35:57 -------------------------------------------------------------------------------- Compression Therapy Details Patient Name: Date of Service: [name] 01/25/2023 11:15 A M Medical Record Number: [id number] Patient Account Number: [id number] [name], [name] ([id number]) [id number] Page 2 of 5 Date of Birth/Sex: Treating RN: Sep 13, 1950 (72 y.o. [name] Primary Care [name]: Other Clinician: Nicoletta [name] Referring [name]: Treating [name]/Extender: [name] in Treatment: 50 Compression Therapy Performed for Wound Assessment: Wound #6 Right,Anterior Lower Leg Performed By: Clinician Redmond Pulling, RN Compression Type: Four Layer Electronic Signature(s) Signed: 01/25/2023 4:25:24 PM By: Redmond Pulling RN, BSN Entered By: Redmond Pulling on 01/25/2023 14:35:57 -------------------------------------------------------------------------------- Encounter Discharge Information Details Patient Name: Date of Service: [name] 01/25/2023 11:15 A M Medical Record Number: [id number] Patient Account Number: [id number] Date of Birth/Sex: Treating RN: Dec 13, 1950 (72 y.o. [name] Primary Care [name]: [name] Other Clinician: Referring [name]: Treating [name]/Extender: [name] in Treatment: 70 Encounter Discharge Information Items Discharge Condition: Stable Ambulatory Status: Wheelchair Discharge Destination: Home Transportation: Private Auto Accompanied By: self Schedule Follow-up Appointment: Yes Clinical Summary of Care: Patient  Declined Electronic Signature(s) Signed: 01/25/2023 4:25:24 PM By: Redmond Pulling RN, BSN Entered By: Redmond Pulling on 01/25/2023 14:36:53 -------------------------------------------------------------------------------- Patient/Caregiver Education Details Patient Name: Date of Service: [name], [name] 7/2/2024andnbsp11:15 A M Medical Record Number: [id number] Patient Account Number: [id number] Date of Birth/Gender: Treating RN: 1951-02-01 (72 y.o. [name] Primary Care Physician: [name] Other Clinician: Referring Physician: Treating Physician/Extender: [name] in Treatment: 77 Education Assessment Education Provided To: Patient Education Topics Provided Wound/Skin Impairment: Responses: State content correctly [employer]) Signed: 01/25/2023 4:25:24 PM By: Redmond Pulling RN, [name], [name] ([id number]) [id number] Page 3 of 5  Signed: 01/25/2023 4:25:24 PM By: Redmond Pulling RN, BSN Entered By: Redmond Pulling on 01/25/2023 14:36:25 -------------------------------------------------------------------------------- Wound Assessment Details Patient Name: Date of Service: [name], [name] 01/25/2023 11:15 A M Medical Record Number: [id number] Patient Account Number: [id number] Date of Birth/Sex: Treating RN: [birth year] (72 y.o. [name] Primary Care [name]: [name] Other Clinician: Referring [name]: Treating [name]/Extender: [name] in Treatment: 50 Wound Status Wound Number: 3 Primary Etiology: Lymphedema Wound Location: Right, Medial Lower Leg Wound Status: Open Wounding Event: Gradually Appeared Date Acquired: 12/07/2021 Weeks Of Treatment: 50 Clustered Wound: No Wound Measurements Length: (cm) 9.4 Width: (cm) 6 Depth: (cm) 0.2 Area: (cm) 44.296 Volume: (cm) 8.859 % Reduction in Area: -729.4% % Reduction in Volume: -314.7% Wound  Description Classification: Full Thickness With Exposed Support Exudate Amount: Large Exudate Type: Serous Exudate Color: amber Structures Periwound Skin Texture Texture Color No Abnormalities Noted: No No Abnormalities Noted: No Moisture No Abnormalities Noted: No Treatment Notes Wound #3 (Lower Leg) Wound Laterality: Right, Medial Cleanser Soap and Water Discharge Instruction: May shower and wash wound with dial antibacterial soap and water prior to dressing change. Vashe 5.8 (oz) Discharge Instruction: Cleanse the wound with Vashe prior to applying a clean dressing using gauze sponges, not tissue or cotton balls. Peri-Wound Care Ketoconazole Cream 2% Discharge Instruction: Apply Ketoconazole mixed with zinc to irritated periwound skin Zinc Oxide Ointment 30g tube Discharge Instruction: Apply Zinc Oxide to periwound with each dressing change Sween Lotion (Moisturizing lotion) Discharge Instruction: Apply moisturizing lotion as directed Topical Gentamicin Discharge Instruction: mixed 50:50 with mupirocin to wound bed Mupirocin Ointment Discharge Instruction: Apply Mupirocin (Bactroban) as instructed Primary Dressing [name], [name] ([id number]) [id number] Page 4 of 5 Hydrofera Blue Ready Transfer Foam, 4x5 (in/in) Discharge Instruction: Apply to wound bed if urgo clean unavailable UrgoClean AG 4 X 5 (in/in) Discharge Instruction: Apply directly to wound bed. Secondary Dressing ABD Pad, 8x10 Discharge Instruction: Apply over primary dressing as directed. Zetuvit Plus 4x8 in Discharge Instruction: Apply over primary dressing as directed. Secured With Compression Wrap ThreePress (3 layer compression wrap) Discharge Instruction: Apply three layer compression as directed. Compression Stockings Add-Ons Electronic Signature(s) Signed: 01/25/2023 4:25:24 PM By: Redmond Pulling RN, BSN Entered By: Redmond Pulling on 01/25/2023  14:35:33 -------------------------------------------------------------------------------- Wound Assessment Details Patient Name: Date of Service: [name], [name] 01/25/2023 11:15 A M Medical Record Number: [id number] Patient Account Number: [id number] Date of Birth/Sex: Treating RN: 1951-01-19 (72 y.o. [name] Primary Care Fox Salminen: [name] Other Clinician: Referring Aracelie Addis: Treating Lonnel Gjerde/Extender: [name] in Treatment: 50 Wound Status Wound Number: 6 Primary Etiology: Calcinosis Wound Location: Right, Anterior Lower Leg Wound Status: Open Wounding Event: Gradually Appeared Date Acquired: 05/12/2022 Weeks Of Treatment: 36 Clustered Wound: Yes Wound Measurements Length: (cm) 2.5 Width: (cm) 1 Depth: (cm) 0.5 Area: (cm) 1.963 Volume: (cm) 0.982 % Reduction in Area: -731.8% % Reduction in Volume: -3991.7% Wound Description Classification: Full Thickness Without Exposed Suppor Exudate Amount: Medium Exudate Type: Serosanguineous Exudate Color: red, brown t Structures Periwound Skin Texture Texture Color No Abnormalities Noted: No No Abnormalities Noted: No Moisture No Abnormalities Noted: No Treatment Notes Wound #6 (Lower Leg) Wound Laterality: Right, Anterior [name], [name] ([id number]) [id number] Page 5 of 5 Cleanser Peri-Wound Care Sween Lotion (Moisturizing lotion) Discharge Instruction: Apply moisturizing lotion as directed Topical Gentamicin Discharge Instruction: As directed by physician Mupirocin Ointment Discharge Instruction: Apply Mupirocin (Bactroban) as instructed Primary Dressing UrgoClean AG 4 X 5 (in/in) Discharge Instruction: Apply directly to wound  bed. Secondary Dressing Woven Gauze Sponge, Non-Sterile 4x4 in Discharge Instruction: Apply over primary dressing as directed. Secured With Compression Wrap ThreePress (3 layer compression wrap) Discharge Instruction: Apply  three layer compression as directed. Compression Stockings Add-Ons Electronic Signature(s) Signed: 01/25/2023 4:25:24 PM By: Redmond Pulling RN, BSN Entered By: Redmond Pulling on 01/25/2023 14:35:33

## 2023-01-25 NOTE — Progress Notes (Signed)
ARMANII, BOELENS ([id number]) [id number] Page 1 of 1 Visit Report for 01/25/2023 SuperBill Details Patient Name: Date of Service: [name], [name] 01/25/2023 Medical Record Number: [id number] Patient Account Number: [id number] Date of Birth/Sex: Treating RN: [birth year] (72 y.o. 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

## 2023-01-27 ENCOUNTER — Encounter: Payer: Self-pay | Admitting: Internal Medicine

## 2023-01-28 ENCOUNTER — Other Ambulatory Visit: Payer: Self-pay

## 2023-01-28 ENCOUNTER — Encounter (HOSPITAL_BASED_OUTPATIENT_CLINIC_OR_DEPARTMENT_OTHER): Payer: Medicare Other | Admitting: General Surgery

## 2023-01-28 DIAGNOSIS — I872 Venous insufficiency (chronic) (peripheral): Secondary | ICD-10-CM | POA: Diagnosis not present

## 2023-01-28 DIAGNOSIS — I509 Heart failure, unspecified: Secondary | ICD-10-CM | POA: Diagnosis not present

## 2023-01-28 DIAGNOSIS — L97822 Non-pressure chronic ulcer of other part of left lower leg with fat layer exposed: Secondary | ICD-10-CM | POA: Diagnosis not present

## 2023-01-28 DIAGNOSIS — L97812 Non-pressure chronic ulcer of other part of right lower leg with fat layer exposed: Secondary | ICD-10-CM | POA: Diagnosis not present

## 2023-01-28 DIAGNOSIS — I82499 Acute embolism and thrombosis of other specified deep vein of unspecified lower extremity: Secondary | ICD-10-CM

## 2023-01-28 DIAGNOSIS — I110 Hypertensive heart disease with heart failure: Secondary | ICD-10-CM | POA: Diagnosis not present

## 2023-01-28 DIAGNOSIS — L942 Calcinosis cutis: Secondary | ICD-10-CM | POA: Diagnosis not present

## 2023-01-28 DIAGNOSIS — I890 Lymphedema, not elsewhere classified: Secondary | ICD-10-CM | POA: Diagnosis not present

## 2023-01-28 MED ORDER — APIXABAN 2.5 MG PO TABS: 2.5000 mg | ORAL_TABLET | Freq: Two times a day (BID) | ORAL | 1 refills | Status: DC

## 2023-01-28 NOTE — Progress Notes (Signed)
PAYDEN, VALLELY ([id number]) [id number] Page 1 of 16 Visit Report for 01/28/2023 Chief Complaint Document Details Patient Name: Date of Service: [name], [name] 01/28/2023 1:00 PM Medical Record Number: [id number] Patient Account Number: [id number] Date of Birth/Sex: Treating RN: 12/29/1950 (72 y.o. M) Primary Care Provider: [name] Other Clinician: Referring Provider: Treating Provider/Extender: [name] in Treatment: 50 Information Obtained from: Patient Chief Complaint Patient seen for complaints of Non-Healing Wounds. Electronic Signature(s) Signed: 01/28/2023 1:47:10 PM By: Duanne Guess MD FACS Entered By: Duanne Guess on 01/28/2023 13:47:10 -------------------------------------------------------------------------------- Debridement Details Patient Name: Date of Service: [name] 01/28/2023 1:00 PM Medical Record Number: [id number] Patient Account Number: [id number] Date of Birth/Sex: Treating RN: 28-Jun-1951 (72 y.o. M) Primary Care Provider: [name] Other Clinician: Referring Provider: Treating Provider/Extender: [name] in Treatment: 50 Debridement Performed for Assessment: Wound #3 Right,Medial Lower Leg Performed By: Physician Duanne Guess, MD Debridement Type: Debridement Level of Consciousness (Pre-procedure): Awake and Alert Pre-procedure Verification/Time Out Yes - 13:39 Taken: Start Time: 13:40 Pain Control: Lidocaine 4% T opical Solution Percent of Wound Bed Debrided: 100% T Area Debrided (cm): otal 36.89 Tissue and other material debrided: Non-Viable, Slough, Slough Level: Non-Viable Tissue Debridement Description: Selective/Open Wound Instrument: Curette Bleeding: Minimum Hemostasis Achieved: Pressure End Time: 13:42 Procedural Pain: 0 Post Procedural Pain: 0 Response to Treatment: Procedure was tolerated well Level of Consciousness (Post- Awake  and Alert procedure): Post Debridement Measurements of Total Wound Length: (cm) 9.4 Width: (cm) 5 Depth: (cm) 0.2 Volume: (cm) 7.383 Character of Wound/Ulcer Post Debridement: Improved [name], [name] ([id number]) [phone].pdf Page 2 of 16 Post Procedure Diagnosis Same as Pre-procedure Notes Scribed for Dr [name] by Brenton Grills RN. Electronic Signature(s) Signed: 01/28/2023 1:46:49 PM By: Duanne Guess MD FACS Entered By: Duanne Guess on 01/28/2023 13:46:49 -------------------------------------------------------------------------------- Debridement Details Patient Name: Date of Service: [name] 01/28/2023 1:00 PM Medical Record Number: [id number] Patient Account Number: [id number] Date of Birth/Sex: Treating RN: 10/11/1950 (72 y.o. M) Primary Care Provider: [name] Other Clinician: Referring Provider: Treating Provider/Extender: [name] in Treatment: 50 Debridement Performed for Assessment: Wound #6 Right,Anterior Lower Leg Performed By: Physician Duanne Guess, MD Debridement Type: Debridement Level of Consciousness (Pre-procedure): Awake and Alert Pre-procedure Verification/Time Out Yes - 13:39 Taken: Start Time: 13:40 Pain Control: Lidocaine 4% T opical Solution Percent of Wound Bed Debrided: 100% T Area Debrided (cm): otal 0.19 Tissue and other material debrided: Non-Viable, Slough, Slough Level: Non-Viable Tissue Debridement Description: Selective/Open Wound Instrument: Curette Bleeding: Minimum Hemostasis Achieved: Pressure End Time: 13:42 Procedural Pain: 0 Post Procedural Pain: 0 Response to Treatment: Procedure was tolerated well Level of Consciousness (Post- Awake and Alert procedure): Post Debridement Measurements of Total Wound Length: (cm) 0.6 Width: (cm) 0.4 Depth: (cm) 0.1 Volume: (cm) 0.019 Character of Wound/Ulcer Post Debridement: Improved Post Procedure Diagnosis Same  as Pre-procedure Notes Scribed for Dr [name] by Brenton Grills RN. Electronic Signature(s) Signed: 01/28/2023 1:47:01 PM By: Duanne Guess MD FACS Entered By: Duanne Guess on 01/28/2023 13:47:01 [name] ([id number]) [id number] Page 3 of 16 -------------------------------------------------------------------------------- HPI Details Patient Name: Date of Service: [name], [name] 01/28/2023 1:00 PM Medical Record Number: [id number] Patient Account Number: [id number] Date of Birth/Sex: Treating RN: 07/24/1951 (72 y.o. M) Primary Care Provider: [name] Other Clinician: Referring Provider: Treating Provider/Extender: [name] in Treatment: 50 History of Present Illness HPI Description: ADMISSION 02/09/2022 This is a 72 year old man who recently moved to [place] from [place]. He  has a history of of coronary artery disease and prior left popliteal vein DVT . He is not diabetic and does not smoke. 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 He has had multiple spinal surgeries and has limited mobility. He primarily uses a motorized wheelchair. He has had lymphedema for many years and does have lymphedema pumps although he says he does not use them frequently. He has wounds on his bilateral lower extremities. He was receiving care in [place] for these prior to his move. They were apparently packing his wounds with Betadine soaked gauze. He has worn compression wraps in the past but not recently. He did say that they helped tremendously. In clinic today, his right ABI is noncompressible but has good signals; the left ABI was 1.17. No formal vascular studies have been performed. On his left dorsal foot, there is a circular lesion that exposes the fat layer. There is fairly heavy slough accumulation within the wound, but no significant odor. On his right medial calf, he has multiple pinholes that have slough  buildup in them and probe for about 2 to 4 mm in depth. They are draining clear fluid. On his right lateral midfoot, he has ulceration consistent with venous stasis. It is geographic and has slough accumulation. 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 02/17/2022: All of the wounds are little bit larger today and have more slough accumulation. Today, the medial calf openings are larger and probe to something that feels calcified. There is odor coming from his wounds. His vascular studies are scheduled for August 9 and 10. 02-24-2022 upon evaluation today patient presents for follow-up concerning his ongoing issues here with his lower extremities. With that being said he does have significant problems with what appears to be cellulitis unfortunately the PCR culture that was sent last week got crushed by [employer] in transit. With that being said we are going to reobtain a culture today although I am actually to do it on the right medial leg as this seems to be the most inflamed area today where there is some questionable drainage as well. I am going to remove some of the calcium deposits which are loosening obtain a deeper culture from this area. Fortunately I do not see any evidence of active infection systemically which is good news but locally I think we are having a bigger issue here. He does have his appointment next Thursday with vascular. Patient has also been referred to [facility] for further evaluation and treatment of the calcinosis for possible sulfate injections. 03/04/2022: The culture that was performed last week grew out multiple species including Klebsiella, Morganella, and Pseudomonas aeruginosa. He had been prescribed Bactrim but this was not adequate to cover all of the species and levofloxacin was recommended. He completed the Bactrim but did not pick up the levofloxacin and begin taking it until  yesterday. 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 He had arterial studies performed today. He does have evidence of peripheral arterial disease. Results copied here: +-------+-----------+-----------+------------+------------+ ABI/TBIT oday's ABIT oday's TBIPrevious ABIPrevious TBI +-------+-----------+-----------+------------+------------+ Right Pollard 0.75    +-------+-----------+-----------+------------+------------+ Left [place] 0.57    +-------+-----------+-----------+------------+------------+ Arterial wall calcification precludes accurate ankle pressures and ABIs. Summary: Right: Resting right ankle-brachial index indicates noncompressible right lower extremity arteries. The  right toe-brachial index is normal. Left: Resting left ankle-brachial index indicates noncompressible left lower extremity arteries. The left toe-brachial index is abnormal. He continues to have further tissue breakdown at the right medial calf and there is ample slough accumulation along with necrotic subcutaneous tissue. The left dorsal foot wound has built up slough, as well. The right medial foot wound is nearly closed with just a light layer of eschar over the surface. The right dorsal lateral foot wound has also accumulated a fair amount of slough and remains quite tender. 03/10/2022: He has been taking the prescribed levofloxacin and has about 3 days left of this. The erythema and induration on his right leg has improved. He continues to experience further breakdown of the right medial calf wound. There is extensive slough and debris accumulation there. There is heavy slough on his left dorsal foot wound, but no odor or purulent drainage. The right medial foot wound appears to be closed. The right dorsal lateral foot wound also has a  fair amount of slough but it is less tender than at our last visit. He still does not have an appointment with dermatology. 03/22/2022: The right anterior tibial wound has closed. The right lateral foot wound is nearly closed with just a little bit of slough. The left dorsal foot wound is smaller and continues to have some slough buildup but less so than at prior visits. There is good granulation tissue underlying the slough. Unfortunately the right medial calf wound continues to deteriorate. It has a foul odor today and a lot of necrotic tissue, the surrounding skin is also erythematous and moist. 03/30/2022: The right lateral foot wound continues to contract and just has a little bit of slough and eschar present. The left dorsal foot wound is also smaller with slough overlying good granulation tissue. The right medial calf wound actually looks quite a bit better today; there is no odor and there is much less necrotic tissue although some remains present. We have been using topical gentamicin and he has been taking oral Augmentin. 04/07/2022: The patient saw dermatology at [facility] last week. Unfortunately, it appears that they did not read any of the documentation that was provided. 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 Overall, it was a highly unsatisfactory consultation. In the meantime, however, we were able to identify compounding pharmacy who could create a topical sodium thiosulfate ointment. The patient has that with him today. Both of the right foot wounds are now closed. The left dorsal foot wound has contracted but still has a layer of slough on the surface. The medial calf wounds on the right all look a little bit  better. They still have heavy slough along with the chunks of calcium. Everything is stained purple. [name], [name] ([id number]) [id number] Page 4 of 16 04/14/2022: The wound on his dorsal left foot is a little bit smaller again today. There is still slough accumulation on the surface. The medial calf wounds have quite a bit less slough accumulation today. His right leg, however, is warm and erythematous, concerning for cellulitis. 04/14/2022: He completed his course of Augmentin yesterday. The medial calf wound sites are much cleaner today and shallower. There is less fragmented calcium in the wounds. He has a lot of lymphedema  fluid-like drainage from these wounds, but no purulent discharge or malodor. The wound on his dorsal left foot has also contracted and is shallower. There is a layer of slough over healthy granulation tissue. 05/05/2022: The left dorsal foot wound is smaller today with less slough and more granulation tissue. The right medial calf wounds are shallower, but have extensive slough and some fat necrosis. The drainage has diminished considerably, however. 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 05/12/2022: The left dorsal foot wound continues to contract and fill with granulation tissue. There is slough on the wound surface. The right medial calf wounds also continue to fill with healthy tissue, but there is remaining slough and fat necrosis present. He had a significant increase in his drainage this week and it was a bright yellow-green color. He also had a new wound open over his right anterior tibial surface associated with the spike of cutaneous calcium. The leg is more red, as well, concerning for infection. 05/19/2022: His culture returned positive for Pseudomonas. He is currently taking a course of oral levofloxacin. We have also  been using topical gentamicin mixed in with his sodium thiosulfate. All of the wounds look better this week. 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 There is slough and nonviable subcutaneous tissue still present. The new anterior tibial wound just has a light layer of slough. The dorsal foot wound also has some slough present and appears a little dry today. 05/26/2022: The right medial leg wound continues to improve. It is feeling with granulation tissue, but still accumulates a fairly thick layer of slough on the surface. The more recent anterior tibial wound has remained quite small, but also has some slough on the surface. The left dorsal foot wound has contracted somewhat and has perimeter epithelialization. He completed his oral levofloxacin. 06/02/2022: The left dorsal foot wound continues to improve significantly. There is just a little slough on the wound surface. The right medial leg wound had black drainage, but it looks like silver alginate was applied last week and I theorized that silver sulfide was created with a combination of the silver alginate and the sodium thiosulfate. It did, however, have a bit of an odor to it and extensive slough accumulation. The small anterior tibial wound also had a bit of slough and nonviable subcutaneous tissue present. The skin of his leg was rather macerated, as well. 06/09/2022: The left dorsal foot wound is smaller again this week with just a light layer of slough on the surface. The right medial leg wound looks substantially better. The leg and periwound skin are no longer red and macerated. There is good granulation tissue forming with less slough and nonviable tissue. The anterior tibial wound just has a small amount of slough accumulation. 06/16/2022: The left dorsal foot wound continues to contract. His wrap slipped a little bit, however, and his foot is more swollen. The right medial leg wound continues to  improve. The underlying tissue is more vital-appearing and there is less slough. He does have substantial drainage. The small anterior tibial wound has a bit of slough accumulation. 06/23/2022: The left dorsal foot wound is about half the size as it was last week. There is just some light slough on the surface and some eschar buildup around the edges. The right anterior leg wound is small with a little slough on the surface.  Unfortunately, the right medial leg wound deteriorated fairly substantially. It is malodorous with gray/green/black discoloration. 06/30/2022: The left dorsal foot wound continues to contract. It is nearly closed with just a light layer of slough present. The right anterior leg wound is about the same size and has a small amount of slough on the surface. The right medial leg wound looks a lot better than it did last week. The odor has abated and the tissue is less pulpy and necrotic-looking. There is still a fairly thick layer of slough present. 12/13; the left dorsal foot wound looks very healthy. There was no need to change the dressing here and no debridement. He has a small area on the right anterior lower leg apparently had not changed much in size. The area on the medial leg is the most problematic area this is large with some depth and a completely nonviable surface today. 07/14/2022: The left dorsal foot wound is smaller today with just a little slough and eschar present. The right anterior lower leg wound is about the same without any significant change. The medial leg wound has a black and green surface and is malodorous. No purulent drainage and the periwound skin is intact. Edema control is suboptimal. 07/21/2022: The dorsal foot wound continues to contract and is nearly closed with just a little bit of slough and eschar on the surface. The right anterior lower leg wound is shallower today but otherwise the dimensions are unchanged. The medial leg wound looks  significantly better although it still has a nonviable surface; the odor and black/green surface has resolved. His culture came back with multiple species sensitive to Augmentin, which was prescribed and a very low level of Pseudomonas, which should be handled by the topical gentamicin we have been using. 07/28/2022: The dorsal foot wound is down to just a very tiny opening with a little eschar on the surface. The right anterior tibial wound is about the same with some slough buildup. The right medial leg wound looks quite a bit better this week. There is thick rubbery slough on the surface, but the odor and drainage has improved significantly. 08/04/2022: The dorsal foot wound is almost healed, but the periwound skin has broken down. This appears to be secondary to moisture and adhesive from the absorbent pad. The right anterior tibial wound is shallower. The right medial leg wound continues to improve. It is smaller, still with rubbery slough on the surface. No malodor or purulent drainage. 08/11/2022: The dorsal foot wound is about the same size. It is a little bit dry with some slough accumulation. The right anterior tibial wound is also unchanged. The right medial leg wound is filling in further with good granulation tissue. Still with some slough buildup on the surface. 08/18/2022: His left leg is bright red, hot, and swollen with cellulitis. The dorsal foot wound actually looks quite good and is superficial with just a little bit of slough accumulation. The right anterior tibial wound is unchanged. The right medial leg wound has filled in with good granulation tissue but has copious amounts of drainage. 08/25/2022: His cellulitis has responded nicely to the oral antibiotics. His dorsal foot wound is smaller but has a fair amount of slough buildup. The right anterior tibial wound remains stable and unchanged. The right medial leg wound has more granulation tissue under a layer of slough, but continues  to have copious drainage. The drainage is actually causing skin irritation and threatens to result in breakdown. 09/01/2022: He continues to have  profuse drainage from his wounds which is resulting in tissue maceration on both the dorsum of his left foot and the periwound distal to his medial leg wound. The dorsal foot wound is nearly healed, despite this. The medial right leg wound is filling in with good granulation tissue. We are working on getting home health assistance to change his dressings more frequently to try and control the drainage better. 09/08/2022: We have had success in controlling his drainage. The tissue maceration has improved significantly and his swelling has come down quite markedly. The medial right leg wound is much more superficial and has substantially less nonviable tissue present. The anterior tibial wound is a little bit larger, however with some nonviable tissue present. 09/15/2022: The dorsal foot wound is nearly closed. The anterior tibial wound measures slightly larger today. The medial right leg wound continues to improve quite dramatically with less nonviable tissue and more robust-looking granulation. Edema control is significantly better. 09/22/2022: The left dorsal foot wound remains open, albeit quite tiny. The anterior tibial wound is a little bit larger again today. The medial right leg wound continues to improve with an increase in robust and healthy granulation tissue and less accumulation of nonviable tissue. [name], [name] ([id number]) [id number] Page 5 of 16 09/29/2022: His home health providers wrapped his left leg extremely tightly and he ended up having to cut the wrap off of his foot. This resulted in his foot being very swollen and that the dorsal foot wound is a little bit larger today. The anterior tibial wound is about the same size with a little slough on the surface. The medial right leg wound continues to fill in. There are  very few areas with any significant depth. There is still slough accumulation. 3/13; Patient has a left dorsal foot wound along with a right anterior tibial wound and right medial wound. We are using silver alginate to the left dorsal foot wound, Santyl and sodium thiosulfate compound ointment to the right lower extremity wounds under 4 layer compression. Patient has no issues or complaints today. 10/13/2022: The left dorsal foot wound is a little bit bigger today, but his foot is also a little bit more swollen. The right medial leg wound has filled in more and has substantially less depth. There is still some nonviable tissue present. The small anterior lower leg wound looks about the same. 10/20/2022: The left dorsal foot wound is epithelializing. There does not appear to be much open at this site. There is some eschar around the edges. The right medial leg wound continues to fill in with granulation tissue. The small anterior lower leg wound is a little bit bigger and there is a chunk of calcium protruding. He continues to have fairly copious drainage from the right leg. 10/27/2022: His home health agency failed to show up at all this past week and as a result, the excessive drainage from his right leg wound has resulted in tissue breakdown. His leg is red and irritated. The wounds themselves look about the same. The wound on his left dorsal foot is nearly closed underneath some eschar. 11/03/2022: We continue to have difficulty with his home health agency. He continues to have significant drainage from his right leg wound which has resulted in even further tissue breakdown. He also was not taking his Lasix as prescribed and I think much of the drainage has been his lymphedema fluid choosing the path of least resistance. The wound on his left dorsal foot is closed, but in the  process of cleaning the site, dry skin was pulled off and he has a new small superficial wound just adjacent to where the dorsal  foot wound was. 11/12/2022: His left dorsal foot is completely healed. The right medial leg continues to deteriorate. He is not taking his Lasix as prescribed, nor is he using his lymphedema pumps at all. As a result, he has massive amounts of drainage saturating his dressings and causing further tissue breakdown on his medial leg. Today, his dressings had a foul odor, as well. 11/19/2022: His right medial leg looks even worse today. There is more erythema and moisture related tissue breakdown. The 2 anterior wounds look a little bit better. The culture that I took last week did produce Pseudomonas and he is currently taking levofloxacin for this. 11/26/2022: There has been significant improvement to his right medial leg. It is far less raw and many of the superficial open areas have epithelialized. He continues to cut the foot portion of his wrap and as result his foot is bulbous and swollen compared to the rest of his leg, where edema control is good. The smaller of the 2 anterior tibial wound is closed, while the larger is contracting somewhat with a bit of slough at the base. 12/03/2022: His leg continues to demonstrate improvement. There has been epithelialization of much of the denuded area. He has a new small anterior tibial wound that has opened around a nidus of calcium. Edema control is improved. 12/10/2022: Continued improvement in the periwound skin. The anterior tibial wounds are about the same, but the medial leg wound has improved further. There is less nonviable tissue present. 12/17/2022: Between his visit on Tuesday with the nurse and today, he has had significantly more drainage which has resulted in some periwound tissue maceration. I suspect this correlates somewhat with his diuretic use, as he says he takes his diuretic religiously on Saturday, Sunday, and Monday and then does not take it after Tuesday due to being out and about and not being close to easy restroom access. I suspect the  excess fluid is just simply draining out of his leg. 12/21/2022: This was supposed to be a nurse visit, but his leg has broken down so significantly, that we have converted to a wound care visit. He did take his diuretics over the weekend, but despite this, he has had copious drainage and all of his tissues have broken down substantially from the right medial leg ulcer all the way down to his ankle and even wrapping posterior to this. There is blue-green staining on his dressing, suggesting ongoing presence of Pseudomonas aeruginosa. 12/24/2022: The wound already looks better than it did 2 days ago. He has been taking ciprofloxacin and the Urgo clean dressing we applied did a much better job of wicking the drainage away from his leg. There is slough on the entire surface of the medial leg wound, but the erythema and induration has improved in the past couple of days. 12/31/2022: Continued improvement of the wound. He says that the pain has basically abated since being on ciprofloxacin. Still with fairly heavy drainage and slough accumulation. 01/07/2023: Overall continued improvement. Still with heavy slough buildup, but less drainage. 01/14/2023: There is a little bit of improvement. The skin is still looking a bit excoriated. He has a few more days left of ciprofloxacin. 01/21/2023: There has been significant improvement. The periwound skin is in much better condition. The ulcers are shallower and actually have some good granulation tissue present under some  slough. 01/28/2023: The wound continues to improve. All of the open areas measured smaller today. There is slough accumulation on the surfaces. His drainage has decreased markedly. Electronic Signature(s) Signed: 01/28/2023 1:47:50 PM By: Duanne Guess MD FACS Entered By: Duanne Guess on 01/28/2023 13:47:49 -------------------------------------------------------------------------------- Physical Exam Details Patient Name: Date of  Service: AYMAN, VICARY [name] 01/28/2023 1:00 PM Medical Record Number: [id number] Patient Account Number: [id number] Date of Birth/Sex: Treating RN: 07/15/1951 (72 y.o. M) Primary Care Provider: [name] Other Clinician: Referring Provider: Treating Provider/Extender: Gilman, Cantey, [name] ([id number]) [id number] Page 6 of 16 Weeks in Treatment: 50 Constitutional . . . . no acute distress. Respiratory Normal work of breathing on room air. Notes 01/28/2023: The wound continues to improve. All of the open areas measured smaller today. There is slough accumulation on the surfaces. His drainage has decreased markedly. Electronic Signature(s) Signed: 01/28/2023 1:48:38 PM By: Duanne Guess MD FACS Entered By: Duanne Guess on 01/28/2023 13:48:38 -------------------------------------------------------------------------------- Physician Orders Details Patient Name: Date of Service: [name] 01/28/2023 1:00 PM Medical Record Number: [id number] Patient Account Number: [id number] Date of Birth/Sex: Treating RN: [birth year] (72 y.o. 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 - Dr. [name] RM 1 Return A Nurse Visit: - Tuesday RM 1 Anesthetic (In clinic) Topical Lidocaine 4% applied to wound  bed Bathing/ Shower/ Hygiene May shower with protection but do not get wound dressing(s) wet. Protect dressing(s) with water repellant cover (for example, large plastic bag) or a cast cover and may then take shower. Edema Control - Lymphedema / SCD / Other Lymphedema Pumps. Use Lymphedema pumps on leg(s) 2-3 times a day for 45-60 minutes. If wearing any wraps or hose, do not remove them. Continue exercising as instructed. Elevate legs to the level of the heart or above for 30 minutes daily and/or when sitting for 3-4 times a day throughout the day. - throughout the day Avoid standing for long periods of time. Compression stocking or Garment 20-30 mm/Hg pressure to: - juxtalite left leg daily Wound Treatment Wound #3 - Lower Leg Wound Laterality: Right, Medial Cleanser: Soap and Water 2 x Per Week/30 Days Discharge Instructions: May shower and wash wound with dial antibacterial soap and water prior to dressing change. Cleanser: Vashe 5.8 (oz) 2 x Per Week/30 Days Discharge Instructions: Cleanse the wound with Vashe prior to applying a clean dressing using gauze sponges, not tissue or cotton balls. Peri-Wound Care: Ketoconazole Cream 2% 2 x Per Week/30 Days [name], [name] ([id number]) [phone].pdf Page 7 of 16 Discharge Instructions: Apply Ketoconazole mixed with zinc to irritated periwound skin Peri-Wound Care: Zinc Oxide Ointment 30g tube 2 x Per Week/30 Days Discharge Instructions: Apply Zinc Oxide to periwound with each dressing change Peri-Wound Care: Sween Lotion (Moisturizing lotion) 2 x Per Week/30 Days Discharge Instructions: Apply moisturizing lotion as directed Topical: Gentamicin 2 x Per Week/30 Days Discharge Instructions: mixed 50:50 with mupirocin to wound bed Topical: Mupirocin Ointment 2 x Per Week/30 Days Discharge Instructions: Apply Mupirocin (Bactroban) as instructed Prim  Dressing: Hydrofera Blue Ready Transfer Foam, 4x5 (in/in) 2 x Per Week/30  Days ary Discharge Instructions: Apply to wound bed if urgo clean unavailable Prim Dressing: UrgoClean AG 4 X 5 (in/in) 2 x Per Week/30 Days ary Discharge Instructions: Apply directly to wound bed. Secondary Dressing: ABD Pad, 8x10 2 x Per Week/30 Days Discharge Instructions: Apply over primary dressing as directed. Secondary Dressing: Zetuvit Plus 4x8 in 2 x Per Week/30 Days Discharge Instructions: Apply over primary dressing as directed. Compression Wrap: ThreePress (3 layer compression wrap) 2 x Per Week/30 Days Discharge Instructions: Apply three layer compression as directed. Wound #6 - Lower Leg Wound Laterality: Right, Anterior Peri-Wound Care: Sween Lotion (Moisturizing lotion) 2 x Per Week/30 Days Discharge Instructions: Apply moisturizing lotion as directed Topical: Gentamicin 2 x Per Week/30 Days Discharge Instructions: As directed by physician Topical: Mupirocin Ointment 2 x Per Week/30 Days Discharge Instructions: Apply Mupirocin (Bactroban) as instructed Prim Dressing: UrgoClean AG 4 X 5 (in/in) 2 x Per Week/30 Days ary Discharge Instructions: Apply directly to wound bed. Secondary Dressing: Woven Gauze Sponge, Non-Sterile 4x4 in 2 x Per Week/30 Days Discharge Instructions: Apply over primary dressing as directed. Compression Wrap: ThreePress (3 layer compression wrap) 2 x Per Week/30 Days Discharge Instructions: Apply three layer compression as directed. Electronic Signature(s) Signed: 01/28/2023 1:57:36 PM By: Duanne Guess MD FACS Entered By: Duanne Guess on 01/28/2023 13:48:50 -------------------------------------------------------------------------------- Problem List Details Patient Name: Date of Service: [name] 01/28/2023 1:00 PM Medical Record Number: [id number] Patient Account Number: [id number] Date of Birth/Sex: Treating RN: July 12, 1951 (72 y.o. 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) 02/09/2022 No Yes I89.0 Lymphedema, not elsewhere classified 02/09/2022 No Yes I10 Essential (primary) hypertension 02/09/2022 No Yes I25.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 angina pectoris 02/09/2022 No Yes Z86.718 Personal history of other venous thrombosis and embolism 02/09/2022 No Yes Inactive Problems ICD-10 Code Description Active Date Inactive Date L97.512 Non-pressure chronic ulcer of other part of right foot with fat layer exposed 02/17/2022 02/17/2022 Resolved Problems ICD-10 Code Description Active Date Resolved Date L97.522 Non-pressure chronic ulcer of other part of left foot with fat layer exposed 02/09/2022 02/09/2022 Electronic Signature(s) Signed: 01/28/2023 1:45:22 PM By: Duanne Guess MD FACS Entered By: Duanne Guess on 01/28/2023 13:45:22 -------------------------------------------------------------------------------- Progress Note Details Patient Name: Date of Service: [name] 01/28/2023 1:00 PM Medical Record Number: [id number] Patient Account Number: [id number] Date of Birth/Sex: Treating RN: 1951/01/20 (72 y.o. M) Primary Care Provider: [name] Other Clinician: Referring Provider: Treating Provider/Extender: [name] in Treatment: 50 Subjective Chief Complaint Information obtained from Patient Patient seen for complaints of Non-Healing Wounds. ESTELLE, HERSTON ([id number]) [id number] Page 9 of 16 History  of Present Illness (HPI) ADMISSION 02/09/2022 This is a 72 year old man who recently moved to [place] from [place]. He has a history of of coronary artery disease and prior left popliteal vein DVT . He is not diabetic and does not smoke. 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 He has had multiple spinal surgeries and has limited mobility. He primarily uses a motorized  wheelchair. He has had lymphedema for many years and does have lymphedema pumps although he says he does not use them frequently. He has wounds on his bilateral lower extremities. He was receiving care in [place] for these prior to his move. They were apparently packing his wounds with Betadine soaked gauze. He has worn compression wraps in the past but not recently. He did say that they helped tremendously. In clinic today, his right ABI is noncompressible but has good signals; the left ABI was 1.17. No formal vascular studies have been performed. On his left dorsal foot, there is a circular lesion that exposes the fat layer. There is fairly heavy slough accumulation within the wound, but no significant odor. On his right medial calf, he has multiple pinholes that have slough buildup in them and probe for about 2 to 4 mm in depth. They are draining clear fluid. On his right lateral midfoot, he has ulceration consistent with venous stasis. It is geographic and has slough accumulation. 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 02/17/2022: All of the wounds are little bit larger today and have more slough accumulation. Today, the medial calf openings are larger and probe to something that feels calcified. There is odor coming from his wounds. His vascular studies are scheduled for August 9 and 10. 02-24-2022 upon evaluation today patient presents for follow-up concerning his ongoing issues here with his lower extremities. With that  being said he does have significant problems with what appears to be cellulitis unfortunately the PCR culture that was sent last week got crushed by [employer] in transit. With that being said we are going to reobtain a culture today although I am actually to do it on the right medial leg as this seems to be the most inflamed area today where there is some questionable drainage as well. I am going to remove some of the calcium deposits which are loosening obtain a deeper culture from this area. Fortunately I do not see any evidence of active infection systemically which is good news but locally I think we are having a bigger issue here. He does have his appointment next Thursday with vascular. Patient has also been referred to [facility] for further evaluation and treatment of the calcinosis for possible sulfate injections. 03/04/2022: The culture that was performed last week grew out multiple species including Klebsiella, Morganella, and Pseudomonas aeruginosa. He had been prescribed Bactrim but this was not adequate to cover all of the species and levofloxacin was recommended. He completed the Bactrim but did not pick up the levofloxacin and begin taking it until yesterday. 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 He had arterial studies performed today. He does have evidence of peripheral arterial disease. Results copied here: +-------+-----------+-----------+------------+------------+ ABI/TBIT oday's ABIT oday's TBIPrevious ABIPrevious TBI +-------+-----------+-----------+------------+------------+ Right [place] 0.75    +-------+-----------+-----------+------------+------------+ Left [place] 0.57    +-------+-----------+-----------+------------+------------+ Arterial wall calcification precludes accurate ankle pressures and  ABIs. Summary: Right: Resting right ankle-brachial index indicates noncompressible right lower extremity arteries. The right toe-brachial index is normal. Left: Resting left ankle-brachial index indicates noncompressible left lower extremity arteries. The left toe-brachial index is abnormal. He continues to have further tissue breakdown at the right medial calf and there is ample slough accumulation along with necrotic subcutaneous tissue. The left dorsal foot  wound has built up slough, as well. The right medial foot wound is nearly closed with just a light layer of eschar over the surface. The right dorsal lateral foot wound has also accumulated a fair amount of slough and remains quite tender. 03/10/2022: He has been taking the prescribed levofloxacin and has about 3 days left of this. The erythema and induration on his right leg has improved. He continues to experience further breakdown of the right medial calf wound. There is extensive slough and debris accumulation there. There is heavy slough on his left dorsal foot wound, but no odor or purulent drainage. The right medial foot wound appears to be closed. The right dorsal lateral foot wound also has a fair amount of slough but it is less tender than at our last visit. He still does not have an appointment with dermatology. 03/22/2022: The right anterior tibial wound has closed. The right lateral foot wound is nearly closed with just a little bit of slough. The left dorsal foot wound is smaller and continues to have some slough buildup but less so than at prior visits. There is good granulation tissue underlying the slough. Unfortunately the right medial calf wound continues to deteriorate. It has a foul odor today and a lot of necrotic tissue, the surrounding skin is also erythematous and moist. 03/30/2022: The right lateral foot wound continues to contract and just has a little bit of slough and eschar present. The left dorsal foot wound is also  smaller with slough overlying good granulation tissue. The right medial calf wound actually looks quite a bit better today; there is no odor and there is much less necrotic tissue although some remains present. We have been using topical gentamicin and he has been taking oral Augmentin. 04/07/2022: The patient saw dermatology at [facility] last week. Unfortunately, it appears that they did not read any of the documentation that was provided. 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 Overall, it was a highly unsatisfactory consultation. In the meantime, however, we were able to identify compounding pharmacy who could create a topical sodium thiosulfate ointment. The patient has that with him today. Both of the right foot wounds are now closed. The left dorsal foot wound has contracted but still has a layer of slough on the surface. The medial calf wounds on the right all look a little bit better. They still have heavy slough along with the chunks of calcium. Everything is stained purple. 04/14/2022: The wound on his dorsal left foot is a little bit smaller again today. There is still slough accumulation on the surface. The medial calf wounds have quite a bit less slough accumulation today. His right leg, however, is warm and erythematous, concerning for cellulitis. 04/14/2022: He completed his course of Augmentin yesterday. The medial calf wound sites are much cleaner today and shallower. There is less fragmented calcium in the wounds. He has a lot of lymphedema fluid-like drainage from these wounds, but no purulent discharge or malodor. The wound on his dorsal left foot has also contracted and is shallower. There is a layer of slough  over healthy granulation tissue. 05/05/2022: The left dorsal foot wound is smaller today with less slough and more granulation tissue. The right medial calf wounds are shallower,  but have extensive slough and some fat necrosis. The drainage has diminished considerably, however. 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 05/12/2022: The left dorsal foot wound continues to contract and fill with granulation tissue. There is slough on the wound surface. The right medial calf wounds also continue to fill with healthy tissue, but there is remaining slough and fat necrosis present. He had a significant increase in his drainage this week and it was a bright yellow-green color. He also had a new wound open over his right anterior tibial surface associated with the spike of cutaneous calcium. The leg is [name], [name] ([id number]) [id number] Page 10 of 16 more red, as well, concerning for infection. 05/19/2022: His culture returned positive for Pseudomonas. He is currently taking a course of oral levofloxacin. We have also been using topical gentamicin mixed in with his sodium thiosulfate. All of the wounds look better this week. 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 There is slough and nonviable subcutaneous tissue still present. The new anterior tibial wound just has a light layer of slough. The dorsal foot wound also has some slough present and appears a little dry today. 05/26/2022: The right medial leg wound continues to improve. It is feeling with granulation tissue, but still accumulates a fairly thick layer of slough on the surface. The more recent anterior tibial wound has remained quite small, but also has some slough on the surface. The left dorsal foot wound has contracted somewhat and has perimeter epithelialization. He  completed his oral levofloxacin. 06/02/2022: The left dorsal foot wound continues to improve significantly. There is just a little slough on the wound surface. The right medial leg wound had black drainage, but it looks like silver alginate was applied last week and I theorized that silver sulfide was created with a combination of the silver alginate and the sodium thiosulfate. It did, however, have a bit of an odor to it and extensive slough accumulation. The small anterior tibial wound also had a bit of slough and nonviable subcutaneous tissue present. The skin of his leg was rather macerated, as well. 06/09/2022: The left dorsal foot wound is smaller again this week with just a light layer of slough on the surface. The right medial leg wound looks substantially better. The leg and periwound skin are no longer red and macerated. There is good granulation tissue forming with less slough and nonviable tissue. The anterior tibial wound just has a small amount of slough accumulation. 06/16/2022: The left dorsal foot wound continues to contract. His wrap slipped a little bit, however, and his foot is more swollen. The right medial leg wound continues to improve. The underlying tissue is more vital-appearing and there is less slough. He does have substantial drainage. The small anterior tibial wound has a bit of slough accumulation. 06/23/2022: The left dorsal foot wound is about half the size as it was last week. There is just some light slough on the surface and some eschar buildup around the edges. The right anterior leg wound is small with a little slough on the surface. Unfortunately, the right medial leg wound deteriorated fairly substantially. It is malodorous with gray/green/black discoloration. 06/30/2022: The left dorsal foot wound continues to contract. It is nearly closed with just a light layer of slough present. The right anterior leg wound is about the same size and has a small  amount of  slough on the surface. The right medial leg wound looks a lot better than it did last week. The odor has abated and the tissue is less pulpy and necrotic-looking. There is still a fairly thick layer of slough present. 12/13; the left dorsal foot wound looks very healthy. There was no need to change the dressing here and no debridement. He has a small area on the right anterior lower leg apparently had not changed much in size. The area on the medial leg is the most problematic area this is large with some depth and a completely nonviable surface today. 07/14/2022: The left dorsal foot wound is smaller today with just a little slough and eschar present. The right anterior lower leg wound is about the same without any significant change. The medial leg wound has a black and green surface and is malodorous. No purulent drainage and the periwound skin is intact. Edema control is suboptimal. 07/21/2022: The dorsal foot wound continues to contract and is nearly closed with just a little bit of slough and eschar on the surface. The right anterior lower leg wound is shallower today but otherwise the dimensions are unchanged. The medial leg wound looks significantly better although it still has a nonviable surface; the odor and black/green surface has resolved. His culture came back with multiple species sensitive to Augmentin, which was prescribed and a very low level of Pseudomonas, which should be handled by the topical gentamicin we have been using. 07/28/2022: The dorsal foot wound is down to just a very tiny opening with a little eschar on the surface. The right anterior tibial wound is about the same with some slough buildup. The right medial leg wound looks quite a bit better this week. There is thick rubbery slough on the surface, but the odor and drainage has improved significantly. 08/04/2022: The dorsal foot wound is almost healed, but the periwound skin has broken down. This appears to be secondary  to moisture and adhesive from the absorbent pad. The right anterior tibial wound is shallower. The right medial leg wound continues to improve. It is smaller, still with rubbery slough on the surface. No malodor or purulent drainage. 08/11/2022: The dorsal foot wound is about the same size. It is a little bit dry with some slough accumulation. The right anterior tibial wound is also unchanged. The right medial leg wound is filling in further with good granulation tissue. Still with some slough buildup on the surface. 08/18/2022: His left leg is bright red, hot, and swollen with cellulitis. The dorsal foot wound actually looks quite good and is superficial with just a little bit of slough accumulation. The right anterior tibial wound is unchanged. The right medial leg wound has filled in with good granulation tissue but has copious amounts of drainage. 08/25/2022: His cellulitis has responded nicely to the oral antibiotics. His dorsal foot wound is smaller but has a fair amount of slough buildup. The right anterior tibial wound remains stable and unchanged. The right medial leg wound has more granulation tissue under a layer of slough, but continues to have copious drainage. The drainage is actually causing skin irritation and threatens to result in breakdown. 09/01/2022: He continues to have profuse drainage from his wounds which is resulting in tissue maceration on both the dorsum of his left foot and the periwound distal to his medial leg wound. The dorsal foot wound is nearly healed, despite this. The medial right leg wound is filling in with good granulation tissue. We  are working on getting home health assistance to change his dressings more frequently to try and control the drainage better. 09/08/2022: We have had success in controlling his drainage. The tissue maceration has improved significantly and his swelling has come down quite markedly. The medial right leg wound is much more superficial and  has substantially less nonviable tissue present. The anterior tibial wound is a little bit larger, however with some nonviable tissue present. 09/15/2022: The dorsal foot wound is nearly closed. The anterior tibial wound measures slightly larger today. The medial right leg wound continues to improve quite dramatically with less nonviable tissue and more robust-looking granulation. Edema control is significantly better. 09/22/2022: The left dorsal foot wound remains open, albeit quite tiny. The anterior tibial wound is a little bit larger again today. The medial right leg wound continues to improve with an increase in robust and healthy granulation tissue and less accumulation of nonviable tissue. 09/29/2022: His home health providers wrapped his left leg extremely tightly and he ended up having to cut the wrap off of his foot. This resulted in his foot being very swollen and that the dorsal foot wound is a little bit larger today. The anterior tibial wound is about the same size with a little slough on the surface. The medial right leg wound continues to fill in. There are very few areas with any significant depth. There is still slough accumulation. 3/13; Patient has a left dorsal foot wound along with a right anterior tibial wound and right medial wound. We are using silver alginate to the left dorsal foot wound, Santyl and sodium thiosulfate compound ointment to the right lower extremity wounds under 4 layer compression. Patient has no issues or complaints today. 10/13/2022: The left dorsal foot wound is a little bit bigger today, but his foot is also a little bit more swollen. The right medial leg wound has filled in more and has substantially less depth. There is still some nonviable tissue present. The small anterior lower leg wound looks about the same. 10/20/2022: The left dorsal foot wound is epithelializing. There does not appear to be much open at this site. There is some eschar around the edges.  The right medial leg wound continues to fill in with granulation tissue. The small anterior lower leg wound is a little bit bigger and there is a chunk of calcium protruding. He continues to have fairly copious drainage from the right leg. [name], [name] ([id number]) [id number] Page 11 of 16 10/27/2022: His home health agency failed to show up at all this past week and as a result, the excessive drainage from his right leg wound has resulted in tissue breakdown. His leg is red and irritated. The wounds themselves look about the same. The wound on his left dorsal foot is nearly closed underneath some eschar. 11/03/2022: We continue to have difficulty with his home health agency. He continues to have significant drainage from his right leg wound which has resulted in even further tissue breakdown. He also was not taking his Lasix as prescribed and I think much of the drainage has been his lymphedema fluid choosing the path of least resistance. The wound on his left dorsal foot is closed, but in the process of cleaning the site, dry skin was pulled off and he has a new small superficial wound just adjacent to where the dorsal foot wound was. 11/12/2022: His left dorsal foot is completely healed. The right medial leg continues to deteriorate. He is not taking his Lasix as  prescribed, nor is he using his lymphedema pumps at all. As a result, he has massive amounts of drainage saturating his dressings and causing further tissue breakdown on his medial leg. Today, his dressings had a foul odor, as well. 11/19/2022: His right medial leg looks even worse today. There is more erythema and moisture related tissue breakdown. The 2 anterior wounds look a little bit better. The culture that I took last week did produce Pseudomonas and he is currently taking levofloxacin for this. 11/26/2022: There has been significant improvement to his right medial leg. It is far less raw and many of the  superficial open areas have epithelialized. He continues to cut the foot portion of his wrap and as result his foot is bulbous and swollen compared to the rest of his leg, where edema control is good. The smaller of the 2 anterior tibial wound is closed, while the larger is contracting somewhat with a bit of slough at the base. 12/03/2022: His leg continues to demonstrate improvement. There has been epithelialization of much of the denuded area. He has a new small anterior tibial wound that has opened around a nidus of calcium. Edema control is improved. 12/10/2022: Continued improvement in the periwound skin. The anterior tibial wounds are about the same, but the medial leg wound has improved further. There is less nonviable tissue present. 12/17/2022: Between his visit on Tuesday with the nurse and today, he has had significantly more drainage which has resulted in some periwound tissue maceration. I suspect this correlates somewhat with his diuretic use, as he says he takes his diuretic religiously on Saturday, Sunday, and Monday and then does not take it after Tuesday due to being out and about and not being close to easy restroom access. I suspect the excess fluid is just simply draining out of his leg. 12/21/2022: This was supposed to be a nurse visit, but his leg has broken down so significantly, that we have converted to a wound care visit. He did take his diuretics over the weekend, but despite this, he has had copious drainage and all of his tissues have broken down substantially from the right medial leg ulcer all the way down to his ankle and even wrapping posterior to this. There is blue-green staining on his dressing, suggesting ongoing presence of Pseudomonas aeruginosa. 12/24/2022: The wound already looks better than it did 2 days ago. He has been taking ciprofloxacin and the Urgo clean dressing we applied did a much better job of wicking the drainage away from his leg. There is slough on  the entire surface of the medial leg wound, but the erythema and induration has improved in the past couple of days. 12/31/2022: Continued improvement of the wound. He says that the pain has basically abated since being on ciprofloxacin. Still with fairly heavy drainage and slough accumulation. 01/07/2023: Overall continued improvement. Still with heavy slough buildup, but less drainage. 01/14/2023: There is a little bit of improvement. The skin is still looking a bit excoriated. He has a few more days left of ciprofloxacin. 01/21/2023: There has been significant improvement. The periwound skin is in much better condition. The ulcers are shallower and actually have some good granulation tissue present under some slough. 01/28/2023: The wound continues to improve. All of the open areas measured smaller today. There is slough accumulation on the surfaces. His drainage has decreased markedly. Patient History Information obtained from Patient, Chart. Family History Cancer - Mother, Heart Disease - Father,Mother, Hypertension - Father,Siblings, Lung Disease -  Siblings, No family history of Diabetes, Hereditary Spherocytosis, Kidney Disease, Seizures, Stroke, Thyroid Problems, Tuberculosis. Social History Former smoker - quit 1991, Marital Status - Married, Alcohol Use - Moderate, Drug Use - No History, Caffeine Use - Daily - coffee. Medical History Eyes Patient has history of Cataracts - right eye removed Denies history of Glaucoma, Optic Neuritis Ear/Nose/Mouth/Throat Denies history of Chronic sinus problems/congestion, Middle ear problems Hematologic/Lymphatic Patient has history of Anemia - macrocytic, Lymphedema Cardiovascular Patient has history of Congestive Heart Failure, Deep Vein Thrombosis - left leg, Hypertension, Peripheral Venous Disease Endocrine Denies history of Type I Diabetes, Type II Diabetes Genitourinary Denies history of End Stage Renal Disease Integumentary (Skin) Denies  history of History of Burn Oncologic Denies history of Received Chemotherapy, Received Radiation Psychiatric Denies history of Anorexia/bulimia, Confinement Anxiety Hospitalization/Surgery History - cervical fusion. - lumbar surgery. - coronary stent placement. - lasik eye surgery. - hydrocele excision. Medical A Surgical History Notes nd Constitutional Symptoms [facility]) obesity Ear/Nose/Mouth/Throat [name], [name] ([id number]) [id number] Page 12 of 16 hard of hearing Respiratory frozen diaphragm right Cardiovascular hyperlipidemia Musculoskeletal myositis, lumbar DDD, spinal stenosis, cervical facet joint syndrome Objective Constitutional no acute distress. Vitals Time Taken: 12:54 PM, Height: 71 in, Weight: 267 lbs, BMI: 37.2, Temperature: 98.3 F, Pulse: 65 bpm, Respiratory Rate: 18 breaths/min, Blood Pressure: 119/71 mmHg. Respiratory Normal work of breathing on room air. General Notes: 01/28/2023: The wound continues to improve. All of the open areas measured smaller today. There is slough accumulation on the surfaces. His drainage has decreased markedly. Integumentary (Hair, Skin) Wound #3 status is Open. Original cause of wound was Gradually Appeared. The date acquired was: 12/07/2021. The wound has been in treatment 50 weeks. The wound is located on the Right,Medial Lower Leg. The wound measures 9.4cm length x 5cm width x 0.2cm depth; 36.914cm^2 area and 7.383cm^3 volume. There is no tunneling or undermining noted. There is a large amount of serous drainage noted. Wound #6 status is Open. Original cause of wound was Gradually Appeared. The date acquired was: 05/12/2022. The wound has been in treatment 37 weeks. The wound is located on the Right,Anterior Lower Leg. The wound measures 0.6cm length x 0.4cm width x 0.1cm depth; 0.188cm^2 area and 0.019cm^3 volume. There is no tunneling or undermining noted. There is a medium amount of  serosanguineous drainage noted. 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 There was a Selective/Open Wound Non-Viable Tissue Debridement with a total area of 36.89 sq cm performed by Duanne Guess, MD. With the following instrument(s): Curette to remove Non-Viable tissue/material. Material removed includes [facility] after achieving pain control using Lidocaine 4% Topical Solution. No specimens were taken. A time out was conducted at 13:39, prior to the start of the procedure. A Minimum amount of bleeding was controlled with Pressure. The procedure was tolerated well with a pain level of 0 throughout and a pain level of 0 following the procedure. Post Debridement Measurements: 9.4cm length x 5cm width x 0.2cm depth; 7.383cm^3 volume. Character of Wound/Ulcer Post Debridement is improved. Post procedure Diagnosis Wound #3: Same as Pre-Procedure General Notes: Scribed for Dr [name] by Brenton Grills RN.. Pre-procedure diagnosis of Wound #  3 is a Lymphedema located on the Right,Medial Lower Leg . There was a Three Layer Compression Therapy Procedure by Brenton Grills, RN. 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 There was a Selective/Open Wound Non-Viable Tissue Debridement with a total area of 0.19 sq cm performed by Duanne Guess, MD. With the following instrument(s): Curette to remove Non-Viable tissue/material. Material  removed includes [facility] after achieving pain control using Lidocaine 4% Topical Solution. No specimens were taken. A time out was conducted at 13:39, prior to the start of the procedure. A Minimum amount of bleeding was controlled with Pressure. The procedure was tolerated well with a pain level of 0 throughout and a pain level of 0 following the procedure. Post Debridement Measurements: 0.6cm length x 0.4cm width x 0.1cm depth; VASILY, STOLZMAN ([id number]) [id number].pdf Page 13 of 16 0.019cm^3 volume. Character of Wound/Ulcer Post Debridement is improved. Post procedure Diagnosis Wound #6: Same as Pre-Procedure General Notes: Scribed for Dr [name] by Brenton Grills RN.. 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 There was a Three Layer Compression Therapy Procedure by Brenton Grills, RN. Post procedure Diagnosis Wound #6: Same as Pre-Procedure Plan Follow-up Appointments: Return Appointment in 1 week. - Dr. [name] RM 1 Nurse Visit: - Tuesday RM 1 Anesthetic: (In clinic) Topical Lidocaine 4% applied to wound bed Bathing/ Shower/ Hygiene: May shower with protection but do not get wound dressing(s) wet. Protect dressing(s) with water repellant cover (for example, large plastic bag) or a cast cover and may then take shower. Edema Control - Lymphedema / SCD / Other: Lymphedema Pumps. Use Lymphedema pumps on leg(s) 2-3 times a day for 45-60 minutes. If wearing any wraps or hose, do not remove them. Continue exercising as instructed. Elevate legs to the level of the heart or above for 30 minutes daily and/or when sitting for 3-4 times a day throughout the day. - throughout the day Avoid standing for long periods of time. Compression stocking or Garment 20-30 mm/Hg pressure to: - juxtalite left leg daily WOUND #3: - Lower Leg Wound Laterality: Right, Medial Cleanser: Soap and Water 2 x Per Week/30 Days Discharge  Instructions: May shower and wash wound with dial antibacterial soap and water prior to dressing change. Cleanser: Vashe 5.8 (oz) 2 x Per Week/30 Days Discharge Instructions: Cleanse the wound with Vashe prior to applying a clean dressing using gauze sponges, not tissue or cotton balls. Peri-Wound Care: Ketoconazole Cream 2% 2 x Per Week/30 Days Discharge Instructions: Apply Ketoconazole mixed with zinc to irritated periwound skin Peri-Wound Care: Zinc Oxide Ointment 30g tube 2 x Per Week/30 Days Discharge Instructions: Apply Zinc Oxide to periwound with each dressing change Peri-Wound Care: Sween Lotion (Moisturizing lotion) 2 x Per Week/30 Days Discharge Instructions: Apply moisturizing lotion as directed Topical: Gentamicin 2 x Per Week/30 Days Discharge Instructions: mixed 50:50 with mupirocin to wound bed Topical: Mupirocin Ointment 2 x Per Week/30 Days Discharge Instructions: Apply Mupirocin (Bactroban) as instructed Prim Dressing: Hydrofera Blue Ready Transfer Foam, 4x5 (in/in) 2 x Per Week/30 Days ary Discharge Instructions: Apply to wound bed if urgo clean unavailable Prim Dressing: UrgoClean AG 4 X 5 (in/in) 2 x Per Week/30 Days ary Discharge Instructions: Apply directly to wound bed. Secondary Dressing: ABD Pad, 8x10 2 x Per Week/30 Days Discharge Instructions: Apply over primary dressing as directed. Secondary Dressing: Zetuvit Plus 4x8 in 2 x  Per Week/30 Days Discharge Instructions: Apply over primary dressing as directed. Com pression Wrap: ThreePress (3 layer compression wrap) 2 x Per Week/30 Days Discharge Instructions: Apply three layer compression as directed. WOUND #6: - Lower Leg Wound Laterality: Right, Anterior Peri-Wound Care: Sween Lotion (Moisturizing lotion) 2 x Per Week/30 Days Discharge Instructions: Apply moisturizing lotion as directed Topical: Gentamicin 2 x Per Week/30 Days Discharge Instructions: As directed by physician Topical: Mupirocin Ointment 2 x  Per Week/30 Days Discharge Instructions: Apply Mupirocin (Bactroban) as instructed Prim Dressing: UrgoClean AG 4 X 5 (in/in) 2 x Per Week/30 Days ary Discharge Instructions: Apply directly to wound bed. Secondary Dressing: Woven Gauze Sponge, Non-Sterile 4x4 in 2 x Per Week/30 Days Discharge Instructions: Apply over primary dressing as directed. Com pression Wrap: ThreePress (3 layer compression wrap) 2 x Per Week/30 Days Discharge Instructions: Apply three layer compression as directed. 01/28/2023: The wound continues to improve. All of the open areas measured smaller today. There is slough accumulation on the surfaces. His drainage has decreased markedly. I used a curette to debride slough from each of the wound surfaces. We will continue the mixture of topical gentamicin and mupirocin with Urgo clean and 3 layer compression. He will have a nurse visit midweek and follow-up with me a week from today. Electronic Signature(s) Signed: 01/28/2023 1:49:45 PM By: Duanne Guess MD FACS Entered By: Duanne Guess on 01/28/2023 13:49:44 [name] ([id number]) [id number] Page 14 of 16 -------------------------------------------------------------------------------- HxROS Details Patient Name: Date of Service: [name], [name] 01/28/2023 1:00 PM Medical Record Number: [id number] Patient Account Number: [id number] Date of Birth/Sex: Treating RN: 12/16/1950 (72 y.o. M) Primary Care Provider: [name] Other Clinician: Referring Provider: Treating Provider/Extender: [name] in Treatment: 50 Information Obtained From Patient Chart Constitutional Symptoms (General Health) Medical History: Past Medical History Notes: obesity Eyes Medical History: Positive for: Cataracts - right eye removed Negative for: Glaucoma; Optic Neuritis Ear/Nose/Mouth/Throat Medical History: Negative for: Chronic sinus problems/congestion; Middle ear  problems Past Medical History Notes: hard of hearing Hematologic/Lymphatic Medical History: Positive for: Anemia - macrocytic; Lymphedema Respiratory Medical History: Past Medical History Notes: frozen diaphragm right Cardiovascular Medical History: Positive for: Congestive Heart Failure; Deep Vein Thrombosis - left leg; Hypertension; Peripheral Venous Disease Past Medical History Notes: hyperlipidemia Endocrine Medical History: Negative for: Type I Diabetes; Type II Diabetes Genitourinary Medical History: Negative for: End Stage Renal Disease Integumentary (Skin) Medical History: Negative for: History of Burn Musculoskeletal Medical History: Past Medical History Notes: myositis, lumbar DDD, spinal stenosis, cervical facet joint syndrome Oncologic Medical HistorySANTINO, SCHIEFER ([id number]) [id number] Page 15 of 16 Negative for: Received Chemotherapy; Received Radiation Psychiatric Medical History: Negative for: Anorexia/bulimia; Confinement Anxiety HBO Extended History Items Eyes: Cataracts Immunizations Pneumococcal Vaccine: Received Pneumococcal Vaccination: Yes Received Pneumococcal Vaccination On or After 60th Birthday: Yes Implantable Devices No devices added Hospitalization / Surgery History Type of Hospitalization/Surgery cervical fusion lumbar surgery coronary stent placement lasik eye surgery hydrocele excision Family and Social History Cancer: Yes - Mother; Diabetes: No; Heart Disease: Yes - Father,Mother; Hereditary Spherocytosis: No; Hypertension: Yes - Father,Siblings; Kidney Disease: No; Lung Disease: Yes - Siblings; Seizures: No; Stroke: No; Thyroid Problems: No; Tuberculosis: No; Former smoker - quit 1991; Marital Status - Married; Alcohol Use: Moderate; Drug Use: No History; Caffeine Use: Daily - coffee; Financial Concerns: No; Food, Clothing or Shelter Needs: No; Support System Lacking: No; Transportation Concerns:  No Electronic Signature(s) Signed: 01/28/2023 1:57:36 PM By: Duanne Guess MD FACS Entered By: Duanne Guess on 01/28/2023  13:47:56 -------------------------------------------------------------------------------- SuperBill Details Patient Name: Date of Service: MCADOO, [place] 01/28/2023 Medical Record Number: [id number] Patient Account Number: [id number] Date of Birth/Sex: Treating RN: [birth year] (72 y.o. [name] Primary Care Provider: [name] Other Clinician: Referring Provider: Treating Provider/Extender: [name] in Treatment: 50 Diagnosis Coding ICD-10 Codes Code Description [phone] 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) I89.0 Lymphedema, not elsewhere classified I10 Essential (primary) hypertension I25.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 angina pectoris Z86.718 Personal history of other venous thrombosis and embolism Facility Procedures : ROHIT, CISNEY Code: [id number] RT ([id number]) L Description: 97597 - DEBRIDE WOUND 1ST 20 SQ CM OR < ICD-10 Diagnosis Description [phone] 97.812 Non-pressure chronic ulcer of other part of right lower leg with fat layer exposed Modifier: [id number]. Quantity: 1 pdf Page 16 of 16 : 76 CPT4 Code: 100127 975 IC L Description: 98 - DEBRIDE WOUND EA ADDL 20 SQ CM D-10 Diagnosis Description 97.812 Non-pressure chronic ulcer of other part of right lower leg with fat layer exposed Modifier: 1 Quantity: Physician Procedures : CPT4 Code Description Modifier [id number] - WC PHYS LEVEL 4 - EST PT 25 ICD-10 Diagnosis Description 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) I89.0 Lymphedema, not elsewhere  classified Quantity: 1 : [id number] 97597 - WC PHYS DEBR WO ANESTH 20 SQ CM ICD-10 Diagnosis Description 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ed Quantity: 1 : [id number] 97598 - WC PHYS DEBR WO ANESTH EA ADD 20 CM ICD-10 Diagnosis Description 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ed Quantity: 1 Electronic Signature(s) Signed: 01/28/2023 1:50:05 PM By: Duanne Guess MD FACS Entered By: Duanne Guess on 01/28/2023 13:50:05

## 2023-01-28 NOTE — Telephone Encounter (Signed)
Prescription refill request for Eliquis received. Indication:dvt Last office visit:9/23 Scr:1.01  2/24 Age: 72 Weight:128.8  kg  Prescription refilled

## 2023-02-01 ENCOUNTER — Encounter (HOSPITAL_BASED_OUTPATIENT_CLINIC_OR_DEPARTMENT_OTHER): Payer: Medicare Other | Admitting: General Surgery

## 2023-02-01 DIAGNOSIS — L97812 Non-pressure chronic ulcer of other part of right lower leg with fat layer exposed: Secondary | ICD-10-CM | POA: Diagnosis not present

## 2023-02-01 DIAGNOSIS — I509 Heart failure, unspecified: Secondary | ICD-10-CM | POA: Diagnosis not present

## 2023-02-01 DIAGNOSIS — I872 Venous insufficiency (chronic) (peripheral): Secondary | ICD-10-CM | POA: Diagnosis not present

## 2023-02-01 DIAGNOSIS — I890 Lymphedema, not elsewhere classified: Secondary | ICD-10-CM | POA: Diagnosis not present

## 2023-02-01 DIAGNOSIS — L942 Calcinosis cutis: Secondary | ICD-10-CM | POA: Diagnosis not present

## 2023-02-01 DIAGNOSIS — I110 Hypertensive heart disease with heart failure: Secondary | ICD-10-CM | POA: Diagnosis not present

## 2023-02-01 NOTE — Progress Notes (Signed)
NAGEE, PREY ([id number]) [id number] Page 1 of 1 Visit Report for 02/01/2023 SuperBill Details Patient Name: Date of Service: [name], [name] 02/01/2023 Medical Record Number: [id number] Patient Account Number: [id number] Date of Birth/Sex: Treating RN: 1950-08-05 (73 y.o. 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

## 2023-02-01 NOTE — Progress Notes (Signed)
[name], [name] ([id number]) [id number] Page 1 of 5 Visit Report for 02/01/2023 Arrival Information Details Patient Name: Date of Service: [name], [name] 02/01/2023 11:15 A M Medical Record Number: [id number] Patient Account Number: [id number] Date of Birth/Sex: Treating RN: Dec 21, 1950 (73 y.o. [name] Primary Care Roald Lukacs: [name] Other Clinician: Referring Marque Bango: Treating Jones Viviani/Extender: [name] in Treatment: 14 Visit Information History Since Last Visit Added or deleted any medications: No Patient Arrived: Wheel Chair Any new allergies or adverse reactions: No Arrival Time: 11:46 Had a fall or experienced change in No Accompanied By: self activities of daily living that may affect Transfer Assistance: None risk of falls: Patient Identification Verified: Yes Signs or symptoms of abuse/neglect since last visito No Secondary Verification Process Completed: Yes Hospitalized since last visit: No Patient Requires Transmission-Based Precautions: No Implantable device outside of the clinic excluding No Patient Has Alerts: Yes cellular tissue based products placed in the center Patient Alerts: R ABI= N/C, TBI= .75 since last visit: L ABI =N/C, TBI = .57 Has Dressing in Place as Prescribed: Yes Has Compression in Place as Prescribed: Yes Pain Present Now: No Electronic Signature(s) Signed: 02/01/2023 5:02:47 PM By: Zenaida Deed RN, BSN Entered By: Zenaida Deed on 02/01/2023 11:46:56 -------------------------------------------------------------------------------- Compression Therapy Details Patient Name: Date of Service: [name] 02/01/2023 11:15 A M Medical Record Number: [id number] Patient Account Number: [id number] Date of Birth/Sex: Treating RN: [birth year] (72 y.o. [name] Primary Care Gwynne Kemnitz: [name] Other Clinician: Referring Kao Berkheimer: Treating Hagen Bohorquez/Extender: [name] in Treatment: 61 Compression Therapy Performed for Wound Assessment: Wound #3 Right,Medial Lower Leg Performed By: Clinician Zenaida Deed, RN Compression Type: Three Emergency planning/management officer) Signed: 02/01/2023 5:02:47 PM By: Zenaida Deed RN, BSN Entered By: Zenaida Deed on 02/01/2023 11:47:43 -------------------------------------------------------------------------------- Encounter Discharge Information Details Patient Name: Date of Service: [name] 02/01/2023 11:15 A M Medical Record Number: [id number] Patient Account Number: [id number] [name], [name] ([id number]) [id number] Page 2 of 5 Date of Birth/Sex: Treating RN: 04-Nov-1950 (72 y.o. [name] Primary Care Masyn Fullam: Other Clinician: [name] Referring Barton Want: Treating Alicia Seib/Extender: [name] in Treatment: 68 Encounter Discharge Information Items Discharge Condition: Stable Ambulatory Status: Wheelchair Discharge Destination: Home Transportation: Private Auto Accompanied By: self Schedule Follow-up Appointment: Yes Clinical Summary of Care: Patient Declined Electronic Signature(s) Signed: 02/01/2023 5:02:47 PM By: Zenaida Deed RN, BSN Entered By: Zenaida Deed on 02/01/2023 11:49:01 -------------------------------------------------------------------------------- Patient/Caregiver Education Details Patient Name: Date of Service: [name], [name] 7/9/2024andnbsp11:15 A M Medical Record Number: [id number] Patient Account Number: [id number] Date of Birth/Gender: Treating RN: [birth year] (72 y.o. [name] Primary Care Physician: [name] Other Clinician: Referring Physician: Treating Physician/Extender: [name] in Treatment: 55 Education Assessment Education Provided To: Patient Education Topics Provided Venous: Methods: Explain/Verbal Responses:  Reinforcements needed, State content correctly [employer]) Signed: 02/01/2023 5:02:47 PM By: Zenaida Deed RN, BSN Entered By: Zenaida Deed on 02/01/2023 11:48:45 -------------------------------------------------------------------------------- Wound Assessment Details Patient Name: Date of Service: [name], [name] 02/01/2023 11:15 A M Medical Record Number: [id number] Patient Account Number: [id number] Date of Birth/Sex: Treating RN: May 14, 1951 (72 y.o. [name] Primary Care Margert Edsall: [name] Other Clinician: Referring Aroldo Galli: Treating Lukah Goswami/Extender: [name] in Treatment: 51 Wound Status Wound Number: 3 Primary Etiology: Lymphedema Wound Location: Right, Medial Lower Leg Wound Status: Open Wounding Event: Gradually Appeared Date Acquired: 12/07/2021 [name], [name] ([id number]) [id number] Page 3 of 5 Weeks Of Treatment: 51 Clustered  Wound: No Wound Measurements Length: (cm) 9.4 Width: (cm) 5 Depth: (cm) 0.2 Area: (cm) 36.914 Volume: (cm) 7.383 % Reduction in Area: -591.1% % Reduction in Volume: -245.6% Wound Description Classification: Full Thickness With Exposed Support Exudate Amount: Large Exudate Type: Serous Exudate Color: amber Structures Periwound Skin Texture Texture Color No Abnormalities Noted: No No Abnormalities Noted: No Moisture No Abnormalities Noted: No Treatment Notes Wound #3 (Lower Leg) Wound Laterality: Right, Medial Cleanser Soap and Water Discharge Instruction: May shower and wash wound with dial antibacterial soap and water prior to dressing change. Vashe 5.8 (oz) Discharge Instruction: Cleanse the wound with Vashe prior to applying a clean dressing using gauze sponges, not tissue or cotton balls. Peri-Wound Care Ketoconazole Cream 2% Discharge Instruction: Apply Ketoconazole mixed with zinc to irritated periwound skin Zinc Oxide Ointment 30g  tube Discharge Instruction: Apply Zinc Oxide to periwound with each dressing change Sween Lotion (Moisturizing lotion) Discharge Instruction: Apply moisturizing lotion as directed Topical Gentamicin Discharge Instruction: mixed 50:50 with mupirocin to wound bed Mupirocin Ointment Discharge Instruction: Apply Mupirocin (Bactroban) as instructed Primary Dressing Hydrofera Blue Ready Transfer Foam, 4x5 (in/in) Discharge Instruction: Apply to wound bed if urgo clean unavailable UrgoClean AG 4 X 5 (in/in) Discharge Instruction: Apply directly to wound bed. Secondary Dressing ABD Pad, 8x10 Discharge Instruction: Apply over primary dressing as directed. Zetuvit Plus 4x8 in Discharge Instruction: Apply over primary dressing as directed. Secured With Compression Wrap ThreePress (3 layer compression wrap) Discharge Instruction: Apply three layer compression as directed. Compression Stockings Add-Ons Electronic Signature(s) Signed: 02/01/2023 5:02:47 PM By: Zenaida Deed RN, BSN Entered By: Zenaida Deed on 02/01/2023 11:47:26 [name] ([id number]) [id number] Page 4 of 5 -------------------------------------------------------------------------------- Wound Assessment Details Patient Name: Date of Service: [name], [name] 02/01/2023 11:15 A M Medical Record Number: [id number] Patient Account Number: [id number] Date of Birth/Sex: Treating RN: Jul 02, 1951 (72 y.o. [name] Primary Care Miasha Emmons: [name] Other Clinician: Referring Marili Vader: Treating Mkayla Steele/Extender: [name] in Treatment: 51 Wound Status Wound Number: 6 Primary Etiology: Calcinosis Wound Location: Right, Anterior Lower Leg Wound Status: Open Wounding Event: Gradually Appeared Date Acquired: 05/12/2022 Weeks Of Treatment: 37 Clustered Wound: Yes Wound Measurements Length: (cm) 0.6 Width: (cm) 0.4 Depth: (cm) 0.1 Area: (cm) 0.188 Volume:  (cm) 0.019 % Reduction in Area: 20.3% % Reduction in Volume: 20.8% Wound Description Classification: Full Thickness Without Exposed Suppor Exudate Amount: Medium Exudate Type: Serosanguineous Exudate Color: red, brown t Structures Periwound Skin Texture Texture Color No Abnormalities Noted: No No Abnormalities Noted: No Moisture No Abnormalities Noted: No Treatment Notes Wound #6 (Lower Leg) Wound Laterality: Right, Anterior Cleanser Peri-Wound Care Sween Lotion (Moisturizing lotion) Discharge Instruction: Apply moisturizing lotion as directed Topical Gentamicin Discharge Instruction: As directed by physician Mupirocin Ointment Discharge Instruction: Apply Mupirocin (Bactroban) as instructed Primary Dressing UrgoClean AG 4 X 5 (in/in) Discharge Instruction: Apply directly to wound bed. Secondary Dressing Woven Gauze Sponge, Non-Sterile 4x4 in Discharge Instruction: Apply over primary dressing as directed. Secured With Compression Wrap ThreePress (3 layer compression wrap) Discharge Instruction: Apply three layer compression as directed. [name], [name] ([id number]) [id number] Page 5 of 5 Compression Stockings Add-Ons Electronic Signature(s) Signed: 02/01/2023 5:02:47 PM By: Zenaida Deed RN, BSN Entered By: Zenaida Deed on 02/01/2023 11:47:26

## 2023-02-02 NOTE — Progress Notes (Signed)
BRANKO, STEEVES ([id number]) [id number] Page 1 of 9 Visit Report for 01/28/2023 Arrival Information Details Patient Name: Date of Service: [name], [name] 01/28/2023 1:00 PM Medical Record Number: [id number] Patient Account Number: [id number] Date of Birth/Sex: Treating RN: 12-22-1950 (72 y.o. [name] Primary Care Collyn Ribas: [name] Other Clinician: Referring Viyan Rosamond: Treating Lamica Mccart/Extender: [name] in Treatment: 50 Visit Information History Since Last Visit All ordered tests and consults were completed: Yes Patient Arrived: Wheel Chair Added or deleted any medications: No Arrival Time: 12:48 Any new allergies or adverse reactions: No Accompanied By: self Had a fall or experienced change in No Transfer Assistance: None activities of daily living that may affect Patient Identification Verified: Yes risk of falls: Secondary Verification Process Completed: Yes Signs or symptoms of abuse/neglect since last visito No Patient Requires Transmission-Based Precautions: No Hospitalized since last visit: No Patient Has Alerts: Yes Implantable device outside of the clinic excluding No Patient Alerts: R ABI= N/C, TBI= .75 cellular tissue based products placed in the center L ABI =N/C, TBI = .57 since last visit: Has Dressing in Place as Prescribed: Yes Pain Present Now: No Electronic Signature(s) Signed: 02/02/2023 4:28:57 PM By: Brenton Grills Entered By: Brenton Grills on 01/28/2023 12:51:37 -------------------------------------------------------------------------------- Compression Therapy Details Patient Name: Date of Service: [name] 01/28/2023 1:00 PM Medical Record Number: [id number] Patient Account Number: [id number] Date of Birth/Sex: Treating RN: [birth year] (72 y.o. [name] Primary Care Berniece Abid: [name] Other Clinician: Referring Ameenah Prosser: Treating Kaulder Zahner/Extender: [name] in Treatment: 50 Compression Therapy Performed for Wound Assessment: Wound #3 Right,Medial Lower Leg Performed By: Clinician Brenton Grills, RN Compression Type: Three Layer Post Procedure Diagnosis Same as Pre-procedure Electronic Signature(s) Signed: 02/02/2023 4:28:57 PM By: Brenton Grills Entered By: Brenton Grills on 01/28/2023 13:41:43 [name] ([id number]) [id number] Page 2 of 9 -------------------------------------------------------------------------------- Compression Therapy Details Patient Name: Date of Service: [name], [name] 01/28/2023 1:00 PM Medical Record Number: [id number] Patient Account Number: [id number] Date of Birth/Sex: Treating RN: September 18, 1950 (72 y.o. [name] Primary Care Judd Mccubbin: [name] Other Clinician: Referring Stellan Vick: Treating Ikey Omary/Extender: [name] in Treatment: 50 Compression Therapy Performed for Wound Assessment: Wound #6 Right,Anterior Lower Leg Performed By: [name], RN Compression Type: Three Layer Post Procedure Diagnosis Same as Pre-procedure Electronic Signature(s) Signed: 02/02/2023 4:28:57 PM By: Brenton Grills Entered By: Brenton Grills on 01/28/2023 13:41:44 -------------------------------------------------------------------------------- Encounter Discharge Information Details Patient Name: Date of Service: [name] 01/28/2023 1:00 PM Medical Record Number: [id number] Patient Account Number: [id number] Date of Birth/Sex: Treating RN: [birth year] (72 y.o. [name] Primary Care Derrick Tiegs: [name] Other Clinician: Referring Joscelynn Brutus: Treating Yanilen Adamik/Extender: [name] in Treatment: 109 Encounter Discharge Information Items Post Procedure Vitals Discharge Condition: Stable Temperature (F): 97.7 Ambulatory Status: Wheelchair Pulse (bpm): 64 Discharge  Destination: Home Respiratory Rate (breaths/min): 18 Transportation: Private Auto Blood Pressure (mmHg): 152/69 Accompanied By: self Schedule Follow-up Appointment: Yes Clinical Summary of Care: Patient Declined Electronic Signature(s) Signed: 02/02/2023 4:28:57 PM By: Brenton Grills Entered By: Brenton Grills on 01/28/2023 14:11:39 -------------------------------------------------------------------------------- Lower Extremity Assessment Details Patient Name: Date of Service: [name], [name] 01/28/2023 1:00 PM Medical Record Number: [id number] Patient Account Number: [id number] Date of Birth/Sex: Treating RN: 07-Jun-1951 (72 y.o. [name] Primary Care Abbigael Detlefsen: [name] Other Clinician: Referring Miyeko Mahlum: Treating Toye Rouillard/Extender: [name] in Treatment: 50 Edema Assessment Assessed: Kyra Searles: No] Franne Forts: No] Edema: [Left: Ye] [Right: s] Calf Crosby, Deveion (  [id number]) [id number] Page 3 of 9 Left: Right: Point of Measurement: From Medial Instep 46.3 cm Ankle Left: Right: Point of Measurement: From Medial Instep 27 cm Vascular Assessment Pulses: Dorsalis Pedis Palpable: [Right:Yes] Electronic Signature(s) Signed: 02/02/2023 4:28:57 PM By: Brenton Grills Entered By: Brenton Grills on 01/28/2023 13:06:22 -------------------------------------------------------------------------------- Multi Wound Chart Details Patient Name: Date of Service: [name] 01/28/2023 1:00 PM Medical Record Number: [id number] Patient Account Number: [id number] Date of Birth/Sex: Treating RN: [birth year] (72 y.o. M) Primary Care Kelseigh Diver: [name] Other Clinician: Referring Yousra Ivens: Treating Madalee Altmann/Extender: [name] in Treatment: 50 Vital Signs Height(in): 71 Pulse(bpm): 65 Weight(lbs): 267 Blood Pressure(mmHg): 119/71 Body Mass Index(BMI): 37.2 Temperature(F): 98.3 Respiratory  Rate(breaths/min): 18 [3:Photos:] [N/A:N/A] Right, Medial Lower Leg Right, Anterior Lower Leg N/A Wound Location: Gradually Appeared Gradually Appeared N/A Wounding Event: Lymphedema Calcinosis N/A Primary Etiology: Cataracts, Anemia, Lymphedema, Cataracts, Anemia, Lymphedema, N/A Comorbid History: Congestive Heart Failure, Deep Vein Congestive Heart Failure, Deep Vein Thrombosis, Hypertension, Peripheral Thrombosis, Hypertension, Peripheral Venous Disease Venous Disease 12/07/2021 05/12/2022 N/A Date Acquired: 50 37 N/A Weeks of Treatment: Open Open N/A Wound Status: No No N/A Wound Recurrence: No Yes N/A Clustered Wound: 9.4x5x0.2 0.6x0.4x0.1 N/A Measurements L x W x D (cm) 36.914 0.188 N/A A (cm) : rea 7.383 0.019 N/A Volume (cm) : -591.10% 20.30% N/A % Reduction in Area: -245.60% 20.80% N/A % Reduction in Volume: Full Thickness With Exposed Support Full Thickness Without Exposed N/A Classification: Structures Support Structures Large Medium N/A Exudate Amount: Serous Serosanguineous N/A Exudate Type: amber red, brown N/A Exudate Color: Debridement - Selective/Open Wound Debridement - Selective/Open Wound N/A Debridement: [name] ([id number]) [id number] Page 4 of 9 13:39 13:39 N/A Pre-procedure Verification/Time Out Taken: Lidocaine 4% Topical Solution Lidocaine 4% Topical Solution N/A Pain Control: [facility] N/A Tissue Debrided: Non-Viable Tissue Non-Viable Tissue N/A Level: 36.89 0.19 N/A Debridement A (sq cm): rea Curette Curette N/A Instrument: Minimum Minimum N/A Bleeding: Pressure Pressure N/A Hemostasis A chieved: 0 0 N/A Procedural Pain: 0 0 N/A Post Procedural Pain: Procedure was tolerated well Procedure was tolerated well N/A Debridement Treatment Response: 9.4x5x0.2 0.6x0.4x0.1 N/A Post Debridement Measurements L x W x D (cm) 7.383 0.019 N/A Post Debridement Volume: (cm) Compression Therapy  Compression Therapy N/A Procedures Performed: Debridement Debridement Treatment Notes Electronic Signature(s) Signed: 01/28/2023 1:46:33 PM By: Duanne Guess MD FACS Entered By: Duanne Guess on 01/28/2023 13:46:33 -------------------------------------------------------------------------------- Multi-Disciplinary Care Plan Details Patient Name: Date of Service: [name] 01/28/2023 1:00 PM Medical Record Number: [id number] Patient Account Number: [id number] Date of Birth/Sex: Treating RN: [birth year] (72 y.o. [name] Primary Care Leola Fiore: [name] Other Clinician: Referring Morena Mckissack: Treating Valerian Jewel/Extender: [name] in Treatment: 50 Multidisciplinary Care Plan reviewed with physician Active Inactive Venous Leg Ulcer Nursing Diagnoses: Knowledge deficit related to disease process and management Potential for venous Insuffiency (use before diagnosis confirmed) Goals: Patient will maintain optimal edema control Date Initiated: 02/17/2022 Target Resolution Date: 02/23/2023 Goal Status: Active Interventions: Assess peripheral edema status every visit. Compression as ordered Provide education on venous insufficiency Treatment Activities: Non-invasive vascular studies : 02/17/2022 Therapeutic compression applied : 02/17/2022 Notes: Wound/Skin Impairment Nursing Diagnoses: Impaired tissue integrity Knowledge deficit related to ulceration/compromised skin integrity Goals: Patient/caregiver will verbalize understanding of skin care regimen [name], [name] ([id number]) [id number] Page 5 of 9 Date Initiated: 02/17/2022 Target Resolution Date: 02/23/2023 Goal Status: Active Ulcer/skin breakdown will have a volume reduction of 30% by week 4 Date Initiated: 02/17/2022 Date Inactivated: 03/10/2022 Target Resolution  Date: 03/10/2022 Unmet Reason: infection being treated, Goal Status: Unmet waiting on derm  appte Ulcer/skin breakdown will have a volume reduction of 50% by week 8 Date Initiated: 03/10/2022 Date Inactivated: 04/14/2022 Target Resolution Date: 04/07/2022 Goal Status: Unmet Unmet Reason: calcinosis Ulcer/skin breakdown will have a volume reduction of 80% by week 12 Date Initiated: 04/14/2022 Date Inactivated: 05/05/2022 Target Resolution Date: 05/05/2022 Unmet Reason: infection, chronic Goal Status: Unmet condition Interventions: Assess patient/caregiver ability to obtain necessary supplies Assess patient/caregiver ability to perform ulcer/skin care regimen upon admission and as needed Assess ulceration(s) every visit Provide education on ulcer and skin care Treatment Activities: Skin care regimen initiated : 02/17/2022 Topical wound management initiated : 02/17/2022 Notes: Electronic Signature(s) Signed: 02/02/2023 4:28:57 PM By: Brenton Grills Entered By: Brenton Grills on 01/28/2023 13:15:46 -------------------------------------------------------------------------------- Pain Assessment Details Patient Name: Date of Service: [name], [name] 01/28/2023 1:00 PM Medical Record Number: [id number] Patient Account Number: [id number] Date of Birth/Sex: Treating RN: [birth year] (72 y.o. [name] Primary Care Marik Sedore: [name] Other Clinician: Referring Chenita Ruda: Treating Harjot Zavadil/Extender: [name] in Treatment: 50 Active Problems Location of Pain Severity and Description of Pain Patient Has Paino No Site Locations Pain Management and Medication Current Pain Management: CARSTON, RIEDL ([id number]) [id number] Page 6 of 9 Electronic Signature(s) Signed: 02/02/2023 4:28:57 PM By: Brenton Grills Entered By: Brenton Grills on 01/28/2023 12:55:03 -------------------------------------------------------------------------------- Patient/Caregiver Education Details Patient Name: Date of Service: [name], [name]  7/5/2024andnbsp1:00 PM Medical Record Number: [id number] Patient Account Number: [id number] Date of Birth/Gender: Treating RN: 1950-11-21 (72 y.o. [name] Primary Care Physician: [name] Other Clinician: Referring Physician: Treating Physician/Extender: [name] in Treatment: 50 Education Assessment Education Provided To: Patient Education Topics Provided Wound/Skin Impairment: Methods: Explain/Verbal Responses: State content correctly Electronic Signature(s) Signed: 02/02/2023 4:28:57 PM By: Brenton Grills Entered By: Brenton Grills on 01/28/2023 13:16:10 -------------------------------------------------------------------------------- Wound Assessment Details Patient Name: Date of Service: ROMMEL, HOGSTON 01/28/2023 1:00 PM Medical Record Number: [id number] Patient Account Number: [id number] Date of Birth/Sex: Treating RN: 07/02/1951 (72 y.o. [name] Primary Care Ayliana Casciano: [name] Other Clinician: Referring Anabelen Kaminsky: Treating Nare Gaspari/Extender: [name] in Treatment: 50 Wound Status Wound Number: 3 Primary Lymphedema Etiology: Wound Location: Right, Medial Lower Leg Wound Open Wounding Event: Gradually Appeared Status: Date Acquired: 12/07/2021 Comorbid Cataracts, Anemia, Lymphedema, Congestive Heart Failure, Deep Weeks Of Treatment: 50 History: Vein Thrombosis, Hypertension, Peripheral Venous Disease Clustered Wound: No Photos [name], [name] ([id number]) [id number] Page 7 of 9 Wound Measurements Length: (cm) 9.4 Width: (cm) 5 Depth: (cm) 0.2 Area: (cm) 36.914 Volume: (cm) 7.383 % Reduction in Area: -591.1% % Reduction in Volume: -245.6% Tunneling: No Undermining: No Wound Description Classification: Full Thickness With Exposed Support Exudate Amount: Large Exudate Type: Serous Exudate Color: amber Structures Periwound Skin Texture Texture  Color No Abnormalities Noted: No No Abnormalities Noted: No Moisture No Abnormalities Noted: No Treatment Notes Wound #3 (Lower Leg) Wound Laterality: Right, Medial Cleanser Soap and Water Discharge Instruction: May shower and wash wound with dial antibacterial soap and water prior to dressing change. Vashe 5.8 (oz) Discharge Instruction: Cleanse the wound with Vashe prior to applying a clean dressing using gauze sponges, not tissue or cotton balls. Peri-Wound Care Ketoconazole Cream 2% Discharge Instruction: Apply Ketoconazole mixed with zinc to irritated periwound skin Zinc Oxide Ointment 30g tube Discharge Instruction: Apply Zinc Oxide to periwound with each dressing change Sween Lotion (Moisturizing lotion) Discharge Instruction: Apply moisturizing lotion as directed Topical Gentamicin Discharge Instruction:  mixed 50:50 with mupirocin to wound bed Mupirocin Ointment Discharge Instruction: Apply Mupirocin (Bactroban) as instructed Primary Dressing Hydrofera Blue Ready Transfer Foam, 4x5 (in/in) Discharge Instruction: Apply to wound bed if urgo clean unavailable UrgoClean AG 4 X 5 (in/in) Discharge Instruction: Apply directly to wound bed. Secondary Dressing ABD Pad, 8x10 Discharge Instruction: Apply over primary dressing as directed. Zetuvit Plus 4x8 in Discharge Instruction: Apply over primary dressing as directed. Secured With [employer] ([id number]) [id number] Page 8 of 9 ThreePress (3 layer compression wrap) Discharge Instruction: Apply three layer compression as directed. Compression Stockings Add-Ons Electronic Signature(s) Signed: 02/02/2023 4:28:57 PM By: Brenton Grills Entered By: Brenton Grills on 01/28/2023 13:12:21 -------------------------------------------------------------------------------- Wound Assessment Details Patient Name: Date of Service: [name], [name] 01/28/2023 1:00 PM Medical Record Number:  [id number] Patient Account Number: [id number] Date of Birth/Sex: Treating RN: [birth year] (72 y.o. [name] Primary Care Maurina Fawaz: [name] Other Clinician: Referring Ashleyanne Hemmingway: Treating Talene Glastetter/Extender: [name] in Treatment: 50 Wound Status Wound Number: 6 Primary Calcinosis Etiology: Wound Location: Right, Anterior Lower Leg Wound Open Wounding Event: Gradually Appeared Status: Date Acquired: 05/12/2022 Comorbid Cataracts, Anemia, Lymphedema, Congestive Heart Failure, Deep Weeks Of Treatment: 37 History: Vein Thrombosis, Hypertension, Peripheral Venous Disease Clustered Wound: Yes Photos Wound Measurements Length: (cm) 0.6 Width: (cm) 0.4 Depth: (cm) 0.1 Area: (cm) 0.188 Volume: (cm) 0.019 % Reduction in Area: 20.3% % Reduction in Volume: 20.8% Tunneling: No Undermining: No Wound Description Classification: Full Thickness Without Exposed Suppor Exudate Amount: Medium Exudate Type: Serosanguineous Exudate Color: red, brown t Structures Periwound Skin Texture Texture Color No Abnormalities Noted: No No Abnormalities Noted: No Moisture No Abnormalities Noted: No Treatment Notes Wound #6 (Lower Leg) Wound Laterality: Right, Anterior [name], [name] ([id number]) [id number] Page 9 of 9 Cleanser Peri-Wound Care Sween Lotion (Moisturizing lotion) Discharge Instruction: Apply moisturizing lotion as directed Topical Gentamicin Discharge Instruction: As directed by physician Mupirocin Ointment Discharge Instruction: Apply Mupirocin (Bactroban) as instructed Primary Dressing UrgoClean AG 4 X 5 (in/in) Discharge Instruction: Apply directly to wound bed. Secondary Dressing Woven Gauze Sponge, Non-Sterile 4x4 in Discharge Instruction: Apply over primary dressing as directed. Secured With Compression Wrap ThreePress (3 layer compression wrap) Discharge Instruction: Apply three layer compression as  directed. Compression Stockings Add-Ons Electronic Signature(s) Signed: 02/02/2023 4:28:57 PM By: Brenton Grills Entered By: Brenton Grills on 01/28/2023 13:12:54 -------------------------------------------------------------------------------- Vitals Details Patient Name: Date of Service: [name] 01/28/2023 1:00 PM Medical Record Number: [id number] Patient Account Number: [id number] Date of Birth/Sex: Treating RN: 07-03-1951 (72 y.o. [name] Primary Care Natilie Krabbenhoft: [name] Other Clinician: Referring Jazir Newey: Treating Phillippa Straub/Extender: [name] in Treatment: 50 Vital Signs Time Taken: 12:54 Temperature (F): 98.3 Height (in): 71 Pulse (bpm): 65 Weight (lbs): 267 Respiratory Rate (breaths/min): 18 Body Mass Index (BMI): 37.2 Blood Pressure (mmHg): 119/71 Reference Range: 80 - 120 mg / dl Electronic Signature(s) Signed: 02/02/2023 4:28:57 PM By: Brenton Grills Entered By: Brenton Grills on 01/28/2023 12:54:56

## 2023-02-04 ENCOUNTER — Encounter (HOSPITAL_BASED_OUTPATIENT_CLINIC_OR_DEPARTMENT_OTHER): Payer: Medicare Other | Admitting: General Surgery

## 2023-02-04 DIAGNOSIS — I872 Venous insufficiency (chronic) (peripheral): Secondary | ICD-10-CM | POA: Diagnosis not present

## 2023-02-04 DIAGNOSIS — L942 Calcinosis cutis: Secondary | ICD-10-CM | POA: Diagnosis not present

## 2023-02-04 DIAGNOSIS — I890 Lymphedema, not elsewhere classified: Secondary | ICD-10-CM | POA: Diagnosis not present

## 2023-02-04 DIAGNOSIS — I509 Heart failure, unspecified: Secondary | ICD-10-CM | POA: Diagnosis not present

## 2023-02-04 DIAGNOSIS — L97812 Non-pressure chronic ulcer of other part of right lower leg with fat layer exposed: Secondary | ICD-10-CM | POA: Diagnosis not present

## 2023-02-04 DIAGNOSIS — I110 Hypertensive heart disease with heart failure: Secondary | ICD-10-CM | POA: Diagnosis not present

## 2023-02-04 NOTE — Progress Notes (Signed)
[name], [name] ([id number]) [id number] Page 1 of 16 Visit Report for 02/04/2023 Chief Complaint Document Details Patient Name: Date of Service: [name] 02/04/2023 11:00 A M Medical Record Number: [id number] Patient Account Number: [id number] Date of Birth/Sex: Treating RN: [birth year] (72 y.o. M) Primary Care Provider: [name] Other Clinician: Referring Provider: Treating Provider/Extender: [name] in Treatment: 51 Information Obtained from: Patient Chief Complaint Patient seen for complaints of Non-Healing Wounds. Electronic Signature(s) Signed: 02/04/2023 11:47:58 AM By: Duanne Guess MD FACS Entered By: Duanne Guess on 02/04/2023 11:47:57 -------------------------------------------------------------------------------- Debridement Details Patient Name: Date of Service: [name] 02/04/2023 11:00 A M Medical Record Number: [id number] Patient Account Number: [id number] Date of Birth/Sex: Treating RN: 1951-04-23 (72 y.o. [name] Primary Care Provider: [name] Other Clinician: Referring Provider: Treating Provider/Extender: [name] in Treatment: 51 Debridement Performed for Assessment: Wound #3 Right,Medial Lower Leg Performed By: Physician Duanne Guess, MD Debridement Type: Debridement Level of Consciousness (Pre-procedure): Awake and Alert Pre-procedure Verification/Time Out Yes - 11:30 Taken: Start Time: 11:31 Pain Control: Lidocaine 4% T opical Solution Percent of Wound Bed Debrided: 40% T Area Debrided (cm): otal 12.15 Tissue and other material debrided: Non-Viable, Slough, Slough Level: Non-Viable Tissue Debridement Description: Selective/Open Wound Instrument: Curette Bleeding: Minimum Hemostasis Achieved: Pressure Procedural Pain: 3 Post Procedural Pain: 2 Response to Treatment: Procedure was tolerated well Level of Consciousness  (Post- Awake and Alert procedure): Post Debridement Measurements of Total Wound Length: (cm) 9 Width: (cm) 4.3 Depth: (cm) 0.4 Volume: (cm) 12.158 Character of Wound/Ulcer Post Debridement: Improved Post Procedure Diagnosis [name] ([id number]) [id number] Page 2 of 16 Same as Pre-procedure Notes Scribed for Dr [name] by Zenaida Deed, RN Electronic Signature(s) Signed: 02/04/2023 11:57:43 AM By: Duanne Guess MD FACS Signed: 02/04/2023 12:03:07 PM By: Zenaida Deed RN, BSN Entered By: Zenaida Deed on 02/04/2023 11:34:04 -------------------------------------------------------------------------------- Debridement Details Patient Name: Date of Service: [name], [name] 02/04/2023 11:00 A M Medical Record Number: [id number] Patient Account Number: [id number] Date of Birth/Sex: Treating RN: August 18, 1950 (72 y.o. [name] Primary Care Provider: [name] Other Clinician: Referring Provider: Treating Provider/Extender: [name] in Treatment: 51 Debridement Performed for Assessment: Wound #6 Right,Anterior Lower Leg Performed By: Physician Duanne Guess, MD Debridement Type: Debridement Level of Consciousness (Pre-procedure): Awake and Alert Pre-procedure Verification/Time Out Yes - 11:30 Taken: Start Time: 11:31 Pain Control: Lidocaine 4% T opical Solution Percent of Wound Bed Debrided: 25% T Area Debrided (cm): otal 0.09 Tissue and other material debrided: Non-Viable, Slough, Slough Level: Non-Viable Tissue Debridement Description: Selective/Open Wound Instrument: Curette Bleeding: Minimum Hemostasis Achieved: Pressure Procedural Pain: 3 Post Procedural Pain: 2 Response to Treatment: Procedure was tolerated well Level of Consciousness (Post- Awake and Alert procedure): Post Debridement Measurements of Total Wound Length: (cm) 1.5 Width: (cm) 0.3 Depth: (cm) 0.2 Volume: (cm)  0.071 Character of Wound/Ulcer Post Debridement: Improved Post Procedure Diagnosis Same as Pre-procedure Notes Scribed for Dr [name] by Zenaida Deed, RN Electronic Signature(s) Signed: 02/04/2023 12:03:07 PM By: Zenaida Deed RN, BSN Signed: 02/04/2023 12:16:33 PM By: Duanne Guess MD FACS Entered By: Zenaida Deed on 02/04/2023 12:01:07 [name] ([id number]) [id number] Page 3 of 16 -------------------------------------------------------------------------------- HPI Details Patient Name: Date of Service: [name] 02/04/2023 11:00 A M Medical Record Number: [id number] Patient Account Number: [id number] Date of Birth/Sex: Treating RN: 1951/05/07 (72 y.o. M) Primary Care Provider: [name] Other Clinician: Referring Provider: Treating Provider/Extender: [name] in Treatment: 103 History of  Present Illness HPI Description: ADMISSION 02/09/2022 This is a 72 year old man who recently moved to [place] from [place]. He has a history of of coronary artery disease and prior left popliteal vein DVT . He is not diabetic and does not smoke. 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 He has had multiple spinal surgeries and has limited mobility. He primarily uses a motorized wheelchair. He has had lymphedema for many years and does have lymphedema pumps although he says he does not use them frequently. He has wounds on his bilateral lower extremities. He was receiving care in [place] for these prior to his move. They were apparently packing his wounds with Betadine soaked gauze. He has worn compression wraps in the past but not recently. He did say that they helped tremendously. In clinic today, his right ABI is noncompressible but has good signals; the left ABI was 1.17. No formal vascular studies have been performed. On his left dorsal foot, there is a circular lesion that exposes the fat layer. There  is fairly heavy slough accumulation within the wound, but no significant odor. On his right medial calf, he has multiple pinholes that have slough buildup in them and probe for about 2 to 4 mm in depth. They are draining clear fluid. On his right lateral midfoot, he has ulceration consistent with venous stasis. It is geographic and has slough accumulation. 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 02/17/2022: All of the wounds are little bit larger today and have more slough accumulation. Today, the medial calf openings are larger and probe to something that feels calcified. There is odor coming from his wounds. His vascular studies are scheduled for August 9 and 10. 02-24-2022 upon evaluation today patient presents for follow-up concerning his ongoing issues here with his lower extremities. With that being said he does have significant problems with what appears to be cellulitis unfortunately the PCR culture that was sent last week got crushed by [employer] in transit. With that being said we are going to reobtain a culture today although I am actually to do it on the right medial leg as this seems to be the most inflamed area today where there is some questionable drainage as well. I am going to remove some of the calcium deposits which are loosening obtain a deeper culture from this area. Fortunately I do not see any evidence of active infection systemically which is good news but locally I think we are having a bigger issue here. He does have his appointment next Thursday with vascular. Patient has also been referred to [facility] for further evaluation and treatment of the calcinosis for possible sulfate injections. 03/04/2022: The culture that was performed last week grew out multiple species including Klebsiella, Morganella, and Pseudomonas aeruginosa. He had been prescribed Bactrim but this was not adequate to  cover all of the species and levofloxacin was recommended. He completed the Bactrim but did not pick up the levofloxacin and begin taking it until yesterday. 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 He had arterial studies performed today. He does have evidence of peripheral arterial disease. Results copied here: +-------+-----------+-----------+------------+------------+ ABI/TBIT oday's ABIT oday's TBIPrevious ABIPrevious TBI +-------+-----------+-----------+------------+------------+ Right [place] 0.75    +-------+-----------+-----------+------------+------------+ Left [place] 0.57    +-------+-----------+-----------+------------+------------+ Arterial wall  calcification precludes accurate ankle pressures and ABIs. Summary: Right: Resting right ankle-brachial index indicates noncompressible right lower extremity arteries. The right toe-brachial index is normal. Left: Resting left ankle-brachial index indicates noncompressible left lower extremity arteries. The left toe-brachial index is abnormal. He continues to have further tissue breakdown at the right medial calf and there is ample slough accumulation along with necrotic subcutaneous tissue. The left dorsal foot wound has built up slough, as well. The right medial foot wound is nearly closed with just a light layer of eschar over the surface. The right dorsal lateral foot wound has also accumulated a fair amount of slough and remains quite tender. 03/10/2022: He has been taking the prescribed levofloxacin and has about 3 days left of this. The erythema and induration on his right leg has improved. He continues to experience further breakdown of the right medial calf wound. There is extensive slough and debris accumulation there. There is heavy slough on his left  dorsal foot wound, but no odor or purulent drainage. The right medial foot wound appears to be closed. The right dorsal lateral foot wound also has a fair amount of slough but it is less tender than at our last visit. He still does not have an appointment with dermatology. 03/22/2022: The right anterior tibial wound has closed. The right lateral foot wound is nearly closed with just a little bit of slough. The left dorsal foot wound is smaller and continues to have some slough buildup but less so than at prior visits. There is good granulation tissue underlying the slough. Unfortunately the right medial calf wound continues to deteriorate. It has a foul odor today and a lot of necrotic tissue, the surrounding skin is also erythematous and moist. 03/30/2022: The right lateral foot wound continues to contract and just has a little bit of slough and eschar present. The left dorsal foot wound is also smaller with slough overlying good granulation tissue. The right medial calf wound actually looks quite a bit better today; there is no odor and there is much less necrotic tissue although some remains present. We have been using topical gentamicin and he has been taking oral Augmentin. 04/07/2022: The patient saw dermatology at [facility] last week. Unfortunately, it appears that they did not read any of the documentation that was provided. 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 Overall, it was a highly unsatisfactory consultation. In the meantime, however, we were able to identify compounding pharmacy who could create a topical sodium thiosulfate ointment. The patient has that with him today. Both of the right foot wounds are now closed. The  left dorsal foot wound has contracted but still has a layer of slough on the surface. The medial calf wounds on the right all look a little bit better. They still have heavy slough along with the chunks of calcium. Everything is stained purple. [name], [name] ([id number]) [id number] Page 4 of 16 04/14/2022: The wound on his dorsal left foot is a little bit smaller again today. There is still slough accumulation on the surface. The medial calf wounds have quite a bit less slough accumulation today. His right leg, however, is warm and erythematous, concerning for cellulitis. 04/14/2022: He completed his course of Augmentin yesterday. The medial calf wound sites  are much cleaner today and shallower. There is less fragmented calcium in the wounds. He has a lot of lymphedema fluid-like drainage from these wounds, but no purulent discharge or malodor. The wound on his dorsal left foot has also contracted and is shallower. There is a layer of slough over healthy granulation tissue. 05/05/2022: The left dorsal foot wound is smaller today with less slough and more granulation tissue. The right medial calf wounds are shallower, but have extensive slough and some fat necrosis. The drainage has diminished considerably, however. 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 05/12/2022: The left dorsal foot wound continues to contract and fill with granulation tissue. There is slough on the wound surface. The right medial calf wounds also continue to fill with healthy tissue, but there is remaining slough and fat necrosis present. He had a significant increase in his drainage this week and it was a bright yellow-green color. He also had a new wound open over his right anterior tibial surface associated with the spike of cutaneous calcium. The leg is more red, as well, concerning  for infection. 05/19/2022: His culture returned positive for Pseudomonas. He is currently taking a course of oral levofloxacin. We have also been using topical gentamicin mixed in with his sodium thiosulfate. All of the wounds look better this week. 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 There is slough and nonviable subcutaneous tissue still present. The new anterior tibial wound just has a light layer of slough. The dorsal foot wound also has some slough present and appears a little dry today. 05/26/2022: The right medial leg wound continues to improve. It is feeling with granulation tissue, but still accumulates a fairly thick layer of slough on the surface. The more recent anterior tibial wound has remained quite small, but also has some slough on the surface. The left dorsal foot wound has contracted somewhat and has perimeter epithelialization. He completed his oral levofloxacin. 06/02/2022: The left dorsal foot wound continues to improve significantly. There is just a little slough on the wound surface. The right medial leg wound had black drainage, but it looks like silver alginate was applied last week and I theorized that silver sulfide was created with a combination of the silver alginate and the sodium thiosulfate. It did, however, have a bit of an odor to it and extensive slough accumulation. The small anterior tibial wound also had a bit of slough and nonviable subcutaneous tissue present. The skin of his leg was rather macerated, as well. 06/09/2022: The left dorsal foot wound is smaller again this week with just a light layer of slough on the surface. The right medial leg wound looks substantially better. The leg and periwound skin are no longer red and macerated. There is good granulation tissue forming with less slough and nonviable tissue. The anterior tibial wound just has a small amount of slough accumulation. 06/16/2022: The left dorsal foot  wound continues to contract. His wrap slipped a little bit, however, and his foot is more swollen. The right medial leg wound continues to improve. The underlying tissue is more vital-appearing and there is less slough. He does have substantial drainage. The small anterior tibial wound has a bit of slough accumulation. 06/23/2022: The left dorsal foot wound is about half the size as it was last week. There is just some light slough on the surface and  some eschar buildup around the edges. The right anterior leg wound is small with a little slough on the surface. Unfortunately, the right medial leg wound deteriorated fairly substantially. It is malodorous with gray/green/black discoloration. 06/30/2022: The left dorsal foot wound continues to contract. It is nearly closed with just a light layer of slough present. The right anterior leg wound is about the same size and has a small amount of slough on the surface. The right medial leg wound looks a lot better than it did last week. The odor has abated and the tissue is less pulpy and necrotic-looking. There is still a fairly thick layer of slough present. 12/13; the left dorsal foot wound looks very healthy. There was no need to change the dressing here and no debridement. He has a small area on the right anterior lower leg apparently had not changed much in size. The area on the medial leg is the most problematic area this is large with some depth and a completely nonviable surface today. 07/14/2022: The left dorsal foot wound is smaller today with just a little slough and eschar present. The right anterior lower leg wound is about the same without any significant change. The medial leg wound has a black and green surface and is malodorous. No purulent drainage and the periwound skin is intact. Edema control is suboptimal. 07/21/2022: The dorsal foot wound continues to contract and is nearly closed with just a little bit of slough and eschar on the  surface. The right anterior lower leg wound is shallower today but otherwise the dimensions are unchanged. The medial leg wound looks significantly better although it still has a nonviable surface; the odor and black/green surface has resolved. His culture came back with multiple species sensitive to Augmentin, which was prescribed and a very low level of Pseudomonas, which should be handled by the topical gentamicin we have been using. 07/28/2022: The dorsal foot wound is down to just a very tiny opening with a little eschar on the surface. The right anterior tibial wound is about the same with some slough buildup. The right medial leg wound looks quite a bit better this week. There is thick rubbery slough on the surface, but the odor and drainage has improved significantly. 08/04/2022: The dorsal foot wound is almost healed, but the periwound skin has broken down. This appears to be secondary to moisture and adhesive from the absorbent pad. The right anterior tibial wound is shallower. The right medial leg wound continues to improve. It is smaller, still with rubbery slough on the surface. No malodor or purulent drainage. 08/11/2022: The dorsal foot wound is about the same size. It is a little bit dry with some slough accumulation. The right anterior tibial wound is also unchanged. The right medial leg wound is filling in further with good granulation tissue. Still with some slough buildup on the surface. 08/18/2022: His left leg is bright red, hot, and swollen with cellulitis. The dorsal foot wound actually looks quite good and is superficial with just a little bit of slough accumulation. The right anterior tibial wound is unchanged. The right medial leg wound has filled in with good granulation tissue but has copious amounts of drainage. 08/25/2022: His cellulitis has responded nicely to the oral antibiotics. His dorsal foot wound is smaller but has a fair amount of slough buildup. The right  anterior tibial wound remains stable and unchanged. The right medial leg wound has more granulation tissue under a layer of slough, but continues to have  copious drainage. The drainage is actually causing skin irritation and threatens to result in breakdown. 09/01/2022: He continues to have profuse drainage from his wounds which is resulting in tissue maceration on both the dorsum of his left foot and the periwound distal to his medial leg wound. The dorsal foot wound is nearly healed, despite this. The medial right leg wound is filling in with good granulation tissue. We are working on getting home health assistance to change his dressings more frequently to try and control the drainage better. 09/08/2022: We have had success in controlling his drainage. The tissue maceration has improved significantly and his swelling has come down quite markedly. The medial right leg wound is much more superficial and has substantially less nonviable tissue present. The anterior tibial wound is a little bit larger, however with some nonviable tissue present. 09/15/2022: The dorsal foot wound is nearly closed. The anterior tibial wound measures slightly larger today. The medial right leg wound continues to improve quite dramatically with less nonviable tissue and more robust-looking granulation. Edema control is significantly better. 09/22/2022: The left dorsal foot wound remains open, albeit quite tiny. The anterior tibial wound is a little bit larger again today. The medial right leg wound continues to improve with an increase in robust and healthy granulation tissue and less accumulation of nonviable tissue. [name], [name] ([id number]) [id number] Page 5 of 16 09/29/2022: His home health providers wrapped his left leg extremely tightly and he ended up having to cut the wrap off of his foot. This resulted in his foot being very swollen and that the dorsal foot wound is a little bit larger today.  The anterior tibial wound is about the same size with a little slough on the surface. The medial right leg wound continues to fill in. There are very few areas with any significant depth. There is still slough accumulation. 3/13; Patient has a left dorsal foot wound along with a right anterior tibial wound and right medial wound. We are using silver alginate to the left dorsal foot wound, Santyl and sodium thiosulfate compound ointment to the right lower extremity wounds under 4 layer compression. Patient has no issues or complaints today. 10/13/2022: The left dorsal foot wound is a little bit bigger today, but his foot is also a little bit more swollen. The right medial leg wound has filled in more and has substantially less depth. There is still some nonviable tissue present. The small anterior lower leg wound looks about the same. 10/20/2022: The left dorsal foot wound is epithelializing. There does not appear to be much open at this site. There is some eschar around the edges. The right medial leg wound continues to fill in with granulation tissue. The small anterior lower leg wound is a little bit bigger and there is a chunk of calcium protruding. He continues to have fairly copious drainage from the right leg. 10/27/2022: His home health agency failed to show up at all this past week and as a result, the excessive drainage from his right leg wound has resulted in tissue breakdown. His leg is red and irritated. The wounds themselves look about the same. The wound on his left dorsal foot is nearly closed underneath some eschar. 11/03/2022: We continue to have difficulty with his home health agency. He continues to have significant drainage from his right leg wound which has resulted in even further tissue breakdown. He also was not taking his Lasix as prescribed and I think much of the drainage has been his  lymphedema fluid choosing the path of least resistance. The wound on his left dorsal foot is  closed, but in the process of cleaning the site, dry skin was pulled off and he has a new small superficial wound just adjacent to where the dorsal foot wound was. 11/12/2022: His left dorsal foot is completely healed. The right medial leg continues to deteriorate. He is not taking his Lasix as prescribed, nor is he using his lymphedema pumps at all. As a result, he has massive amounts of drainage saturating his dressings and causing further tissue breakdown on his medial leg. Today, his dressings had a foul odor, as well. 11/19/2022: His right medial leg looks even worse today. There is more erythema and moisture related tissue breakdown. The 2 anterior wounds look a little bit better. The culture that I took last week did produce Pseudomonas and he is currently taking levofloxacin for this. 11/26/2022: There has been significant improvement to his right medial leg. It is far less raw and many of the superficial open areas have epithelialized. He continues to cut the foot portion of his wrap and as result his foot is bulbous and swollen compared to the rest of his leg, where edema control is good. The smaller of the 2 anterior tibial wound is closed, while the larger is contracting somewhat with a bit of slough at the base. 12/03/2022: His leg continues to demonstrate improvement. There has been epithelialization of much of the denuded area. He has a new small anterior tibial wound that has opened around a nidus of calcium. Edema control is improved. 12/10/2022: Continued improvement in the periwound skin. The anterior tibial wounds are about the same, but the medial leg wound has improved further. There is less nonviable tissue present. 12/17/2022: Between his visit on Tuesday with the nurse and today, he has had significantly more drainage which has resulted in some periwound tissue maceration. I suspect this correlates somewhat with his diuretic use, as he says he takes his diuretic religiously on  Saturday, Sunday, and Monday and then does not take it after Tuesday due to being out and about and not being close to easy restroom access. I suspect the excess fluid is just simply draining out of his leg. 12/21/2022: This was supposed to be a nurse visit, but his leg has broken down so significantly, that we have converted to a wound care visit. He did take his diuretics over the weekend, but despite this, he has had copious drainage and all of his tissues have broken down substantially from the right medial leg ulcer all the way down to his ankle and even wrapping posterior to this. There is blue-green staining on his dressing, suggesting ongoing presence of Pseudomonas aeruginosa. 12/24/2022: The wound already looks better than it did 2 days ago. He has been taking ciprofloxacin and the Urgo clean dressing we applied did a much better job of wicking the drainage away from his leg. There is slough on the entire surface of the medial leg wound, but the erythema and induration has improved in the past couple of days. 12/31/2022: Continued improvement of the wound. He says that the pain has basically abated since being on ciprofloxacin. Still with fairly heavy drainage and slough accumulation. 01/07/2023: Overall continued improvement. Still with heavy slough buildup, but less drainage. 01/14/2023: There is a little bit of improvement. The skin is still looking a bit excoriated. He has a few more days left of ciprofloxacin. 01/21/2023: There has been significant improvement. The periwound  skin is in much better condition. The ulcers are shallower and actually have some good granulation tissue present under some slough. 01/28/2023: The wound continues to improve. All of the open areas measured smaller today. There is slough accumulation on the surfaces. His drainage has decreased markedly. 02/04/2023: Continued improvement in the wound. Drainage remains significantly diminished. Slough accumulation is  present but to a lesser extent. Periwound skin continues to improve. Edema control is good. Electronic Signature(s) Signed: 02/04/2023 11:48:57 AM By: Duanne Guess MD FACS Entered By: Duanne Guess on 02/04/2023 11:48:57 -------------------------------------------------------------------------------- Physical Exam Details Patient Name: Date of Service: [name], [name] 02/04/2023 11:00 A M Medical Record Number: [id number] Patient Account Number: [id number] Date of Birth/Sex: Treating RN: May 25, 1951 (72 y.o. Barnie Del ([id number]) [id number] Page 6 of 16 Primary Care Provider: [name] Other Clinician: Referring Provider: Treating Provider/Extender: [name] in Treatment: 34 Constitutional Hypertensive, asymptomatic. . . . no acute distress. Respiratory Normal work of breathing on room air. Notes 02/04/2023: Continued improvement in the wound. Drainage remains significantly diminished. Slough accumulation is present but to a lesser extent. Periwound skin continues to improve. Edema control is good. Electronic Signature(s) Signed: 02/04/2023 11:50:14 AM By: Duanne Guess MD FACS Entered By: Duanne Guess on 02/04/2023 11:50:14 -------------------------------------------------------------------------------- Physician Orders Details Patient Name: Date of Service: [name], PARKMAN [name] 02/04/2023 11:00 A M Medical Record Number: [id number] Patient Account Number: [id number] Date of Birth/Sex: Treating RN: Dec 22, 1950 (72 y.o. 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 - Dr. [name] RM 2 Return A Friday 7/19 @ 10:30 am Nurse Visit: - Tuesday RM 1 Anesthetic (In clinic) Topical Lidocaine 4% applied to wound bed Bathing/ Shower/ Hygiene May shower with protection but do not get wound dressing(s) wet. Protect dressing(s) with water repellant cover (for example, large plastic bag) or a cast cover and may then take shower. Edema Control - Lymphedema / SCD / Other Lymphedema Pumps. Use Lymphedema pumps on leg(s) 2-3 times a day for 45-60 minutes. If wearing any wraps or hose, do not remove them. Continue exercising as instructed. Elevate legs to the level of the heart or above for 30 minutes daily and/or when sitting for 3-4 times a day throughout the day. - throughout the day Avoid standing for long periods of time. Compression stocking or Garment 20-30 mm/Hg pressure to: - juxtalite left leg daily Other Edema Control Orders/Instructions: - kerlix/coban wrap to left leg while wife recovers from surgery Wound Treatment Wound #3 - Lower Leg Wound Laterality: Right, Medial Cleanser: Soap and Water 2 x Per Week/30 Days Discharge Instructions: May shower and wash wound with dial antibacterial soap and water prior to dressing change. [name], [name] ([id number]) [id number] Page 7 of 16 Cleanser: Vashe 5.8 (oz) 2 x Per Week/30 Days Discharge Instructions: Cleanse the wound with Vashe prior to applying a clean dressing using gauze sponges, not tissue or cotton balls. Peri-Wound Care: Ketoconazole Cream 2% 2 x Per Week/30 Days Discharge Instructions: Apply Ketoconazole mixed with zinc to irritated periwound skin  Peri-Wound Care: Zinc Oxide  Ointment 30g tube 2 x Per Week/30 Days Discharge Instructions: Apply Zinc Oxide to periwound with each dressing change Peri-Wound Care: Sween Lotion (Moisturizing lotion) 2 x Per Week/30 Days Discharge Instructions: Apply moisturizing lotion as directed Topical: Gentamicin 2 x Per Week/30 Days Discharge Instructions: mixed 50:50 with mupirocin to wound bed Topical: Mupirocin Ointment 2 x Per Week/30 Days Discharge Instructions: Apply Mupirocin (Bactroban) as instructed Prim Dressing: Hydrofera Blue Ready Transfer Foam, 4x5 (in/in) 2 x Per Week/30 Days ary Discharge Instructions: Apply to wound bed if urgo clean unavailable Prim Dressing: UrgoClean AG 4 X 5 (in/in) 2 x Per Week/30 Days ary Discharge Instructions: Apply directly to wound bed. Secondary Dressing: ABD Pad, 8x10 2 x Per Week/30 Days Discharge Instructions: Apply over primary dressing as directed. Secondary Dressing: Zetuvit Plus 4x8 in 2 x Per Week/30 Days Discharge Instructions: Apply over primary dressing as directed. Compression Wrap: ThreePress (3 layer compression wrap) 2 x Per Week/30 Days Discharge Instructions: Apply three layer compression as directed. Wound #6 - Lower Leg Wound Laterality: Right, Anterior Peri-Wound Care: Sween Lotion (Moisturizing lotion) 2 x Per Week/30 Days Discharge Instructions: Apply moisturizing lotion as directed Topical: Gentamicin 2 x Per Week/30 Days Discharge Instructions: As directed by physician Topical: Mupirocin Ointment 2 x Per Week/30 Days Discharge Instructions: Apply Mupirocin (Bactroban) as instructed Prim Dressing: UrgoClean AG 4 X 5 (in/in) 2 x Per Week/30 Days ary Discharge Instructions: Apply directly to wound bed. Secondary Dressing: Woven Gauze Sponge, Non-Sterile 4x4 in 2 x Per Week/30 Days Discharge Instructions: Apply over primary dressing as directed. Compression Wrap: ThreePress (3 layer compression wrap) 2 x Per Week/30 Days Discharge Instructions: Apply three  layer compression as directed. Electronic Signature(s) Signed: 02/04/2023 11:57:43 AM By: Duanne Guess MD FACS Entered By: Duanne Guess on 02/04/2023 11:50:33 -------------------------------------------------------------------------------- Problem List Details Patient Name: Date of Service: [name] 02/04/2023 11:00 A M Medical Record Number: [id number] Patient Account Number: [id number] Date of Birth/Sex: Treating RN: Jul 17, 1951 (72 y.o. 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 02/09/2022 No Yes 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) 02/09/2022 No Yes I89.0 Lymphedema, not elsewhere classified 02/09/2022 No Yes I10 Essential (primary) hypertension 02/09/2022 No Yes I25.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 angina pectoris 02/09/2022 No Yes Z86.718 Personal history of other venous thrombosis and embolism 02/09/2022 No Yes Inactive Problems ICD-10 Code Description Active Date Inactive Date L97.512 Non-pressure chronic ulcer of other part of right foot with fat layer exposed 02/17/2022 02/17/2022 Resolved Problems ICD-10 Code Description Active Date Resolved Date L97.522 Non-pressure chronic ulcer of other part of left foot with fat layer exposed 02/09/2022 02/09/2022 Electronic Signature(s) Signed: 02/04/2023 11:46:20 AM By: Duanne Guess MD FACS Entered By: Duanne Guess on 02/04/2023 11:46:20 -------------------------------------------------------------------------------- Progress Note Details Patient Name: Date of  Service: [name] 02/04/2023 11:00 A M Medical Record Number: [id number] Patient Account Number: [id number] Date of Birth/Sex: Treating RN: Jun 28, 1951 (72 y.o. M) Primary Care Provider: [name] Other Clinician: Referring Provider: Treating Provider/Extender: [name] in Treatment: [address] [place] ([id number]) [id number] Page 9 of 16 Chief Complaint Information obtained from Patient Patient seen for complaints of Non-Healing Wounds.  History of Present Illness (HPI) ADMISSION 02/09/2022 This is a 72 year old man who recently moved to [place] from [place]. He has a history of of coronary artery disease and prior left popliteal vein DVT . He is not diabetic and does not smoke. 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 He has had multiple spinal surgeries and has limited mobility. He primarily uses a motorized wheelchair. He has had lymphedema for many years and does have lymphedema pumps although he says he does not use them frequently. He has wounds on his bilateral lower extremities. He was receiving care in [place] for these prior to his move. They were apparently packing his wounds with Betadine soaked gauze. He has worn compression wraps in the past but not recently. He did say that they helped tremendously. In clinic today, his right ABI is noncompressible but has good signals; the left ABI was 1.17. No formal vascular studies have been performed. On his left dorsal foot, there is a circular lesion that exposes the fat layer. There is fairly heavy slough accumulation within the wound, but no significant odor. On his right medial calf, he has multiple pinholes that have slough buildup in them and probe for about 2 to 4 mm in depth. They are draining clear fluid. On his right lateral midfoot, he has ulceration consistent with venous stasis. It is geographic and has slough accumulation. He has  changes in his lower extremities consistent with stasis dermatitis, but no hemosiderin deposition or thickening of the skin. 02/17/2022: All of the wounds are little bit larger today and have more slough accumulation. Today, the medial calf openings are larger and probe to something that feels calcified. There is odor coming from his wounds. His vascular studies are scheduled for August 9 and 10. 02-24-2022 upon evaluation today patient presents for follow-up concerning his ongoing issues here with his lower extremities. With that being said he does have significant problems with what appears to be cellulitis unfortunately the PCR culture that was sent last week got crushed by [employer] in transit. With that being said we are going to reobtain a culture today although I am actually to do it on the right medial leg as this seems to be the most inflamed area today where there is some questionable drainage as well. I am going to remove some of the calcium deposits which are loosening obtain a deeper culture from this area. Fortunately I do not see any evidence of active infection systemically which is good news but locally I think we are having a bigger issue here. He does have his appointment next Thursday with vascular. Patient has also been referred to [facility] for further evaluation and treatment of the calcinosis for possible sulfate injections. 03/04/2022: The culture that was performed last week grew out multiple species including Klebsiella, Morganella, and Pseudomonas aeruginosa. He had been prescribed Bactrim but this was not adequate to cover all of the species and levofloxacin was recommended. He completed the Bactrim but did not pick up the levofloxacin and begin taking it until yesterday. 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  He had arterial studies performed today. He does have evidence of peripheral arterial disease. Results copied here: +-------+-----------+-----------+------------+------------+ ABI/TBIT oday's ABIT oday's TBIPrevious ABIPrevious TBI +-------+-----------+-----------+------------+------------+ Right [place] 0.75    +-------+-----------+-----------+------------+------------+ Left [place] 0.57    +-------+-----------+-----------+------------+------------+ Arterial  wall calcification precludes accurate ankle pressures and ABIs. Summary: Right: Resting right ankle-brachial index indicates noncompressible right lower extremity arteries. The right toe-brachial index is normal. Left: Resting left ankle-brachial index indicates noncompressible left lower extremity arteries. The left toe-brachial index is abnormal. He continues to have further tissue breakdown at the right medial calf and there is ample slough accumulation along with necrotic subcutaneous tissue. The left dorsal foot wound has built up slough, as well. The right medial foot wound is nearly closed with just a light layer of eschar over the surface. The right dorsal lateral foot wound has also accumulated a fair amount of slough and remains quite tender. 03/10/2022: He has been taking the prescribed levofloxacin and has about 3 days left of this. The erythema and induration on his right leg has improved. He continues to experience further breakdown of the right medial calf wound. There is extensive slough and debris accumulation there. There is heavy slough on his left dorsal foot wound, but no odor or purulent drainage. The right medial foot wound appears to be closed. The right dorsal lateral foot wound also has a fair amount of slough but it is less tender than at our last visit. He still does not have an appointment with dermatology. 03/22/2022: The right anterior tibial wound has closed. The right lateral foot wound is nearly  closed with just a little bit of slough. The left dorsal foot wound is smaller and continues to have some slough buildup but less so than at prior visits. There is good granulation tissue underlying the slough. Unfortunately the right medial calf wound continues to deteriorate. It has a foul odor today and a lot of necrotic tissue, the surrounding skin is also erythematous and moist. 03/30/2022: The right lateral foot wound continues to contract and just has a little bit of slough and eschar present. The left dorsal foot wound is also smaller with slough overlying good granulation tissue. The right medial calf wound actually looks quite a bit better today; there is no odor and there is much less necrotic tissue although some remains present. We have been using topical gentamicin and he has been taking oral Augmentin. 04/07/2022: The patient saw dermatology at [facility] last week. Unfortunately, it appears that they did not read any of the documentation that was provided. 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 Overall, it was a highly unsatisfactory consultation. In the meantime, however, we were able to identify compounding pharmacy who could create a topical sodium thiosulfate ointment. The patient has that with him today. Both of the right foot wounds are now closed. The left dorsal foot wound has contracted but still has a layer of slough on the surface. The medial calf wounds on the right all look a little bit better. They still have heavy slough along with the chunks of calcium. Everything is stained purple. 04/14/2022: The wound on his dorsal left foot is a little bit smaller again today. There is still slough accumulation on the  surface. The medial calf wounds have quite a bit less slough accumulation today. His right leg, however, is warm and erythematous, concerning for cellulitis. 04/14/2022: He completed his course of Augmentin yesterday. The medial calf wound sites are much cleaner today and shallower. There  is less fragmented calcium in the wounds. He has a lot of lymphedema fluid-like drainage from these wounds, but no purulent discharge or malodor. The wound on his dorsal left foot has also contracted and is shallower. There is a layer of slough over healthy granulation tissue. 05/05/2022: The left dorsal foot wound is smaller today with less slough and more granulation tissue. The right medial calf wounds are shallower, but have extensive slough and some fat necrosis. The drainage has diminished considerably, however. 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 05/12/2022: The left dorsal foot wound continues to contract and fill with granulation tissue. There is slough on the wound surface. The right medial calf wounds also continue to fill with healthy tissue, but there is remaining slough and fat necrosis present. He had a significant increase in his drainage this week and it was a bright yellow-green color. He also had a new wound open over his right anterior tibial surface associated with the spike of cutaneous calcium. The leg is more red, as well, concerning for infection. 05/19/2022: His culture returned positive for Pseudomonas. He is currently taking a course of oral levofloxacin. We have also been using topical gentamicin mixed in with his sodium thiosulfate. All of the wounds look better this week. 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 There is slough and nonviable  subcutaneous tissue still present. The new anterior tibial wound just has a light layer of slough. The dorsal foot wound also has some slough present and appears a little dry today. 05/26/2022: The right medial leg wound continues to improve. It is feeling with granulation tissue, but still accumulates a fairly thick layer of slough on the surface. The more recent anterior tibial wound has remained quite small, but also has some slough on the surface. The left dorsal foot wound has contracted somewhat and has perimeter epithelialization. He completed his oral levofloxacin. 06/02/2022: The left dorsal foot wound continues to improve significantly. There is just a little slough on the wound surface. The right medial leg wound had black drainage, but it looks like silver alginate was applied last week and I theorized that silver sulfide was created with a combination of the silver alginate and the sodium thiosulfate. It did, however, have a bit of an odor to it and extensive slough accumulation. The small anterior tibial wound also had a bit of slough and nonviable subcutaneous tissue present. The skin of his leg was rather macerated, as well. 06/09/2022: The left dorsal foot wound is smaller again this week with just a light layer of slough on the surface. The right medial leg wound looks substantially better. The leg and periwound skin are no longer red and macerated. There is good granulation tissue forming with less slough and nonviable tissue. The anterior tibial wound just has a small amount of slough accumulation. 06/16/2022: The left dorsal foot wound continues to contract. His wrap slipped a little bit, however, and his foot is more swollen. The right medial leg wound continues to improve. The underlying tissue is more vital-appearing and there is less slough. He does have substantial drainage. The small anterior tibial wound has a bit of slough accumulation. 06/23/2022: The left dorsal foot wound  is about half the size as it was last week. There is just some light slough on the surface  and some eschar buildup around the edges. The right anterior leg wound is small with a little slough on the surface. Unfortunately, the right medial leg wound deteriorated fairly substantially. It is malodorous with gray/green/black discoloration. 06/30/2022: The left dorsal foot wound continues to contract. It is nearly closed with just a light layer of slough present. The right anterior leg wound is about the same size and has a small amount of slough on the surface. The right medial leg wound looks a lot better than it did last week. The odor has abated and the tissue is less pulpy and necrotic-looking. There is still a fairly thick layer of slough present. 12/13; the left dorsal foot wound looks very healthy. There was no need to change the dressing here and no debridement. He has a small area on the right anterior lower leg apparently had not changed much in size. The area on the medial leg is the most problematic area this is large with some depth and a completely nonviable surface today. 07/14/2022: The left dorsal foot wound is smaller today with just a little slough and eschar present. The right anterior lower leg wound is about the same without any significant change. The medial leg wound has a black and green surface and is malodorous. No purulent drainage and the periwound skin is intact. Edema control is suboptimal. 07/21/2022: The dorsal foot wound continues to contract and is nearly closed with just a little bit of slough and eschar on the surface. The right anterior lower leg wound is shallower today but otherwise the dimensions are unchanged. The medial leg wound looks significantly better although it still has a nonviable surface; the odor and black/green surface has resolved. His culture came back with multiple species sensitive to Augmentin, which was prescribed and a very low level of  Pseudomonas, which should be handled by the topical gentamicin we have been using. 07/28/2022: The dorsal foot wound is down to just a very tiny opening with a little eschar on the surface. The right anterior tibial wound is about the same with some slough buildup. The right medial leg wound looks quite a bit better this week. There is thick rubbery slough on the surface, but the odor and drainage has improved significantly. 08/04/2022: The dorsal foot wound is almost healed, but the periwound skin has broken down. This appears to be secondary to moisture and adhesive from the absorbent pad. The right anterior tibial wound is shallower. The right medial leg wound continues to improve. It is smaller, still with rubbery slough on the surface. No malodor or purulent drainage. 08/11/2022: The dorsal foot wound is about the same size. It is a little bit dry with some slough accumulation. The right anterior tibial wound is also unchanged. The right medial leg wound is filling in further with good granulation tissue. Still with some slough buildup on the surface. 08/18/2022: His left leg is bright red, hot, and swollen with cellulitis. The dorsal foot wound actually looks quite good and is superficial with just a little bit of slough accumulation. The right anterior tibial wound is unchanged. The right medial leg wound has filled in with good granulation tissue but has copious amounts of drainage. 08/25/2022: His cellulitis has responded nicely to the oral antibiotics. His dorsal foot wound is smaller but has a fair amount of slough buildup. The right anterior tibial wound remains stable and unchanged. The right medial leg wound has more granulation tissue under a layer of slough, but continues to have  copious drainage. The drainage is actually causing skin irritation and threatens to result in breakdown. 09/01/2022: He continues to have profuse drainage from his wounds which is resulting in tissue maceration on  both the dorsum of his left foot and the periwound distal to his medial leg wound. The dorsal foot wound is nearly healed, despite this. The medial right leg wound is filling in with good granulation tissue. We are working on getting home health assistance to change his dressings more frequently to try and control the drainage better. 09/08/2022: We have had success in controlling his drainage. The tissue maceration has improved significantly and his swelling has come down quite markedly. The medial right leg wound is much more superficial and has substantially less nonviable tissue present. The anterior tibial wound is a little bit larger, however with some nonviable tissue present. 09/15/2022: The dorsal foot wound is nearly closed. The anterior tibial wound measures slightly larger today. The medial right leg wound continues to improve quite dramatically with less nonviable tissue and more robust-looking granulation. Edema control is significantly better. 09/22/2022: The left dorsal foot wound remains open, albeit quite tiny. The anterior tibial wound is a little bit larger again today. The medial right leg wound continues to improve with an increase in robust and healthy granulation tissue and less accumulation of nonviable tissue. 09/29/2022: His home health providers wrapped his left leg extremely tightly and he ended up having to cut the wrap off of his foot. This resulted in his foot being very swollen and that the dorsal foot wound is a little bit larger today. The anterior tibial wound is about the same size with a little slough on the surface. The medial right leg wound continues to fill in. There are very few areas with any significant depth. There is still slough accumulation. 3/13; Patient has a left dorsal foot wound along with a right anterior tibial wound and right medial wound. We are using silver alginate to the left dorsal foot wound, Santyl and sodium thiosulfate compound ointment to  the right lower extremity wounds under 4 layer compression. Patient has no issues or complaints today. 10/13/2022: The left dorsal foot wound is a little bit bigger today, but his foot is also a little bit more swollen. The right medial leg wound has filled in more and [place], [name] ([id number]) [phone].pdf Page 11 of 16 has substantially less depth. There is still some nonviable tissue present. The small anterior lower leg wound looks about the same. 10/20/2022: The left dorsal foot wound is epithelializing. There does not appear to be much open at this site. There is some eschar around the edges. The right medial leg wound continues to fill in with granulation tissue. The small anterior lower leg wound is a little bit bigger and there is a chunk of calcium protruding. He continues to have fairly copious drainage from the right leg. 10/27/2022: His home health agency failed to show up at all this past week and as a result, the excessive drainage from his right leg wound has resulted in tissue breakdown. His leg is red and irritated. The wounds themselves look about the same. The wound on his left dorsal foot is nearly closed underneath some eschar. 11/03/2022: We continue to have difficulty with his home health agency. He continues to have significant drainage from his right leg wound which has resulted in even further tissue breakdown. He also was not taking his Lasix as prescribed and I think much of the drainage has been  his lymphedema fluid choosing the path of least resistance. The wound on his left dorsal foot is closed, but in the process of cleaning the site, dry skin was pulled off and he has a new small superficial wound just adjacent to where the dorsal foot wound was. 11/12/2022: His left dorsal foot is completely healed. The right medial leg continues to deteriorate. He is not taking his Lasix as prescribed, nor is he using his lymphedema pumps at all. As a result,  he has massive amounts of drainage saturating his dressings and causing further tissue breakdown on his medial leg. Today, his dressings had a foul odor, as well. 11/19/2022: His right medial leg looks even worse today. There is more erythema and moisture related tissue breakdown. The 2 anterior wounds look a little bit better. The culture that I took last week did produce Pseudomonas and he is currently taking levofloxacin for this. 11/26/2022: There has been significant improvement to his right medial leg. It is far less raw and many of the superficial open areas have epithelialized. He continues to cut the foot portion of his wrap and as result his foot is bulbous and swollen compared to the rest of his leg, where edema control is good. The smaller of the 2 anterior tibial wound is closed, while the larger is contracting somewhat with a bit of slough at the base. 12/03/2022: His leg continues to demonstrate improvement. There has been epithelialization of much of the denuded area. He has a new small anterior tibial wound that has opened around a nidus of calcium. Edema control is improved. 12/10/2022: Continued improvement in the periwound skin. The anterior tibial wounds are about the same, but the medial leg wound has improved further. There is less nonviable tissue present. 12/17/2022: Between his visit on Tuesday with the nurse and today, he has had significantly more drainage which has resulted in some periwound tissue maceration. I suspect this correlates somewhat with his diuretic use, as he says he takes his diuretic religiously on Saturday, Sunday, and Monday and then does not take it after Tuesday due to being out and about and not being close to easy restroom access. I suspect the excess fluid is just simply draining out of his leg. 12/21/2022: This was supposed to be a nurse visit, but his leg has broken down so significantly, that we have converted to a wound care visit. He did take  his diuretics over the weekend, but despite this, he has had copious drainage and all of his tissues have broken down substantially from the right medial leg ulcer all the way down to his ankle and even wrapping posterior to this. There is blue-green staining on his dressing, suggesting ongoing presence of Pseudomonas aeruginosa. 12/24/2022: The wound already looks better than it did 2 days ago. He has been taking ciprofloxacin and the Urgo clean dressing we applied did a much better job of wicking the drainage away from his leg. There is slough on the entire surface of the medial leg wound, but the erythema and induration has improved in the past couple of days. 12/31/2022: Continued improvement of the wound. He says that the pain has basically abated since being on ciprofloxacin. Still with fairly heavy drainage and slough accumulation. 01/07/2023: Overall continued improvement. Still with heavy slough buildup, but less drainage. 01/14/2023: There is a little bit of improvement. The skin is still looking a bit excoriated. He has a few more days left of ciprofloxacin. 01/21/2023: There has been significant improvement. The  periwound skin is in much better condition. The ulcers are shallower and actually have some good granulation tissue present under some slough. 01/28/2023: The wound continues to improve. All of the open areas measured smaller today. There is slough accumulation on the surfaces. His drainage has decreased markedly. 02/04/2023: Continued improvement in the wound. Drainage remains significantly diminished. Slough accumulation is present but to a lesser extent. Periwound skin continues to improve. Edema control is good. Patient History Information obtained from Patient, Chart. Family History Cancer - Mother, Heart Disease - Father,Mother, Hypertension - Father,Siblings, Lung Disease - Siblings, No family history of Diabetes, Hereditary Spherocytosis, Kidney Disease, Seizures, Stroke,  Thyroid Problems, Tuberculosis. Social History Former smoker - quit 1991, Marital Status - Married, Alcohol Use - Moderate, Drug Use - No History, Caffeine Use - Daily - coffee. Medical History Eyes Patient has history of Cataracts - right eye removed Denies history of Glaucoma, Optic Neuritis Ear/Nose/Mouth/Throat Denies history of Chronic sinus problems/congestion, Middle ear problems Hematologic/Lymphatic Patient has history of Anemia - macrocytic, Lymphedema Cardiovascular Patient has history of Congestive Heart Failure, Deep Vein Thrombosis - left leg, Hypertension, Peripheral Venous Disease Endocrine Denies history of Type I Diabetes, Type II Diabetes Genitourinary Denies history of End Stage Renal Disease Integumentary (Skin) Denies history of History of Burn Oncologic Denies history of Received Chemotherapy, Received Radiation Psychiatric [name], [name] ([id number]) [id number] Page 12 of 16 Denies history of Anorexia/bulimia, Confinement Anxiety Hospitalization/Surgery History - cervical fusion. - lumbar surgery. - coronary stent placement. - lasik eye surgery. - hydrocele excision. Medical A Surgical History Notes nd Constitutional Symptoms (General Health) obesity Ear/Nose/Mouth/Throat hard of hearing Respiratory frozen diaphragm right Cardiovascular hyperlipidemia Musculoskeletal myositis, lumbar DDD, spinal stenosis, cervical facet joint syndrome Objective Constitutional Hypertensive, asymptomatic. no acute distress. Vitals Time Taken: 10:52 AM, Height: 71 in, Weight: 267 lbs, BMI: 37.2, Temperature: 98.0 F, Pulse: 78 bpm, Respiratory Rate: 18 breaths/min, Blood Pressure: 159/79 mmHg. Respiratory Normal work of breathing on room air. General Notes: 02/04/2023: Continued improvement in the wound. Drainage remains significantly diminished. Slough accumulation is present but to a lesser extent. Periwound skin continues to improve. Edema  control is good. Integumentary (Hair, Skin) Wound #3 status is Open. Original cause of wound was Gradually Appeared. The date acquired was: 12/07/2021. The wound has been in treatment 51 weeks. The wound is located on the Right,Medial Lower Leg. The wound measures 9cm length x 4.3cm width x 0.4cm depth; 30.395cm^2 area and 12.158cm^3 volume. There is Fat Layer (Subcutaneous Tissue) exposed. There is no tunneling or undermining noted. There is a large amount of serosanguineous drainage noted. The wound margin is distinct with the outline attached to the wound base. There is small (1-33%) red granulation within the wound bed. There is a large (67-100%) amount of necrotic tissue within the wound bed including Adherent Slough. The periwound skin appearance had no abnormalities noted for texture. The periwound skin appearance had no abnormalities noted for moisture. The periwound skin appearance exhibited: Hemosiderin Staining, Erythema. The surrounding wound skin color is noted with erythema. Periwound temperature was noted as No Abnormality. Wound #6 status is Open. Original cause of wound was Gradually Appeared. The date acquired was: 05/12/2022. The wound has been in treatment 38 weeks. The wound is located on the Right,Anterior Lower Leg. The wound measures 1.5cm length x 0.5cm width x 0.3cm depth; 0.589cm^2 area and 0.177cm^3 volume. There is Fat Layer (Subcutaneous Tissue) exposed. There is no tunneling or undermining noted. There is a medium amount of serosanguineous drainage noted. The  wound margin is distinct with the outline attached to the wound base. There is small (1-33%) red granulation within the wound bed. There is a small (1-33%) amount of necrotic tissue within the wound bed including Adherent Slough. The periwound skin appearance had no abnormalities noted for texture. The periwound skin appearance had no abnormalities noted for moisture. The periwound skin appearance exhibited:  Hemosiderin Staining. Periwound temperature was noted as No Abnormality. 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 There was a Selective/Open Wound Non-Viable Tissue Debridement with a total area of 12.15 sq cm performed by Duanne Guess, MD. With the following instrument(s): Curette to remove Non-Viable tissue/material. Material removed includes [facility] after achieving pain control using Lidocaine 4% Topical Solution. No specimens were taken. A time out was conducted at 11:30, prior to the start of the procedure. A Minimum amount of bleeding was controlled with Pressure. The procedure was tolerated well with a pain level of 3 throughout and a pain level of 2 following the procedure. Post Debridement Measurements: 9cm length x 4.3cm width x 0.4cm depth; [name], [name] ([id number]) [phone].pdf Page 13 of 16 12.158cm^3 volume. Character of Wound/Ulcer Post Debridement is improved. Post procedure Diagnosis Wound #3: Same as Pre-Procedure General Notes: Scribed for Dr [name] by Zenaida Deed, RN. Pre-procedure diagnosis of Wound #3 is a Lymphedema located on the Right,Medial Lower Leg . There was a Three Layer Compression Therapy Procedure by Zenaida Deed, RN. 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 There was a Selective/Open Wound Non-Viable  Tissue Debridement with a total area of 0.09 sq cm performed by Duanne Guess, MD. With the following instrument(s): Curette to remove Non-Viable tissue/material. Material removed includes [facility] after achieving pain control using Lidocaine 4% Topical Solution. No specimens were taken. A time out was conducted at 11:30, prior to the start of the procedure. A Minimum amount of bleeding was controlled with Pressure. The procedure was tolerated well with a pain level of 3 throughout and a pain level of 2 following the procedure. Post Debridement Measurements: 1.5cm length x 0.3cm width x 0.2cm depth; 0.071cm^3 volume. Character of Wound/Ulcer Post Debridement is improved. Post procedure Diagnosis Wound #6: Same as Pre-Procedure General Notes: Scribed for Dr [name] by Zenaida Deed, RN. Plan Follow-up Appointments: Return Appointment in 1 week. - Dr. [name] RM 2 Friday 7/19 @ 10:30 am Nurse Visit: - Tuesday RM 1 Anesthetic: (In clinic) Topical Lidocaine 4% applied to wound bed Bathing/ Shower/ Hygiene: May shower with protection but do not get wound dressing(s) wet. Protect dressing(s) with water repellant cover (for example, large plastic bag) or a cast cover and may then take shower. Edema Control - Lymphedema / SCD / Other: Lymphedema Pumps. Use Lymphedema pumps on leg(s) 2-3 times a day for 45-60 minutes. If wearing any wraps or hose, do not remove them. Continue exercising as instructed. Elevate legs to the level of the heart or above for 30 minutes daily and/or when sitting for 3-4 times a day throughout the day. - throughout the day Avoid standing  for long periods of time. Compression stocking or Garment 20-30 mm/Hg pressure to: - juxtalite left leg daily Other Edema Control Orders/Instructions: - kerlix/coban wrap to left leg while wife recovers from surgery WOUND #3: - Lower Leg Wound Laterality: Right, Medial Cleanser: Soap and Water 2 x Per Week/30 Days Discharge Instructions:  May shower and wash wound with dial antibacterial soap and water prior to dressing change. Cleanser: Vashe 5.8 (oz) 2 x Per Week/30 Days Discharge Instructions: Cleanse the wound with Vashe prior to applying a clean dressing using gauze sponges, not tissue or cotton balls. Peri-Wound Care: Ketoconazole Cream 2% 2 x Per Week/30 Days Discharge Instructions: Apply Ketoconazole mixed with zinc to irritated periwound skin Peri-Wound Care: Zinc Oxide Ointment 30g tube 2 x Per Week/30 Days Discharge Instructions: Apply Zinc Oxide to periwound with each dressing change Peri-Wound Care: Sween Lotion (Moisturizing lotion) 2 x Per Week/30 Days Discharge Instructions: Apply moisturizing lotion as directed Topical: Gentamicin 2 x Per Week/30 Days Discharge Instructions: mixed 50:50 with mupirocin to wound bed Topical: Mupirocin Ointment 2 x Per Week/30 Days Discharge Instructions: Apply Mupirocin (Bactroban) as instructed Prim Dressing: Hydrofera Blue Ready Transfer Foam, 4x5 (in/in) 2 x Per Week/30 Days ary Discharge Instructions: Apply to wound bed if urgo clean unavailable Prim Dressing: UrgoClean AG 4 X 5 (in/in) 2 x Per Week/30 Days ary Discharge Instructions: Apply directly to wound bed. Secondary Dressing: ABD Pad, 8x10 2 x Per Week/30 Days Discharge Instructions: Apply over primary dressing as directed. Secondary Dressing: Zetuvit Plus 4x8 in 2 x Per Week/30 Days Discharge Instructions: Apply over primary dressing as directed. Com pression Wrap: ThreePress (3 layer compression wrap) 2 x Per Week/30 Days Discharge Instructions: Apply three layer compression as directed. WOUND #6: - Lower Leg Wound Laterality: Right, Anterior Peri-Wound Care: Sween Lotion (Moisturizing lotion) 2 x Per Week/30 Days Discharge Instructions: Apply moisturizing lotion as directed Topical: Gentamicin 2 x Per Week/30 Days Discharge Instructions: As directed by physician Topical: Mupirocin Ointment 2 x Per Week/30  Days Discharge Instructions: Apply Mupirocin (Bactroban) as instructed Prim Dressing: UrgoClean AG 4 X 5 (in/in) 2 x Per Week/30 Days ary Discharge Instructions: Apply directly to wound bed. Secondary Dressing: Woven Gauze Sponge, Non-Sterile 4x4 in 2 x Per Week/30 Days Discharge Instructions: Apply over primary dressing as directed. Com pression Wrap: ThreePress (3 layer compression wrap) 2 x Per Week/30 Days Discharge Instructions: Apply three layer compression as directed. 02/04/2023: Continued improvement in the wound. Drainage remains significantly diminished. Slough accumulation is present but to a lesser extent. Periwound skin continues to improve. Edema control is good. I used a curette to debride slough from all of the open wound surfaces. We will continue the mixture of topical gentamicin and mupirocin to the wounds with the mixture of ketoconazole, triamcinolone, and zinc oxide to the periwound. Continue Urgo clean for the contact layer and 3 layer compression or equivalent. [name], [name] ([id number]) [id number] Page 14 of 16 will see him back in a week. He will have a nurse visit earlier in the week for a dressing change. Electronic Signature(s) Signed: 02/04/2023 12:03:07 PM By: Zenaida Deed RN, BSN Signed: 02/04/2023 12:16:33 PM By: Duanne Guess MD FACS Previous Signature: 02/04/2023 11:51:45 AM Version By: Duanne Guess MD FACS Entered By: Zenaida Deed on 02/04/2023 12:01:37 -------------------------------------------------------------------------------- HxROS Details Patient Name: Date of Service: DAVIEON, MOREFIELD 02/04/2023 11:00 A M Medical Record Number: [id number] Patient Account Number: [id number] Date of Birth/Sex: Treating RN: 21-May-1951 (72 y.o. M) Primary Care Provider: [name]  Other Clinician: Referring Provider: Treating Provider/Extender: [name] in Treatment: 51 Information Obtained  From Patient Chart Constitutional Symptoms (General Health) Medical History: Past Medical History Notes: obesity Eyes Medical History: Positive for: Cataracts - right eye removed Negative for: Glaucoma; Optic Neuritis Ear/Nose/Mouth/Throat Medical History: Negative for: Chronic sinus problems/congestion; Middle ear problems Past Medical History Notes: hard of hearing Hematologic/Lymphatic Medical History: Positive for: Anemia - macrocytic; Lymphedema Respiratory Medical History: Past Medical History Notes: frozen diaphragm right Cardiovascular Medical History: Positive for: Congestive Heart Failure; Deep Vein Thrombosis - left leg; Hypertension; Peripheral Venous Disease Past Medical History Notes: hyperlipidemia Endocrine Medical History: Negative for: Type I Diabetes; Type II Diabetes Genitourinary Medical History: Negative for: End Stage Renal Disease Integumentary (Skin) Medical HistoryALANZO, GARRETTE ([id number]) [id number] Page 15 of 16 Negative for: History of Burn Musculoskeletal Medical History: Past Medical History Notes: myositis, lumbar DDD, spinal stenosis, cervical facet joint syndrome Oncologic Medical History: Negative for: Received Chemotherapy; Received Radiation Psychiatric Medical History: Negative for: Anorexia/bulimia; Confinement Anxiety HBO Extended History Items Eyes: Cataracts Immunizations Pneumococcal Vaccine: Received Pneumococcal Vaccination: Yes Received Pneumococcal Vaccination On or After 60th Birthday: Yes Implantable Devices No devices added Hospitalization / Surgery History Type of Hospitalization/Surgery cervical fusion lumbar surgery coronary stent placement lasik eye surgery hydrocele excision Family and Social History Cancer: Yes - Mother; Diabetes: No; Heart Disease: Yes - Father,Mother; Hereditary Spherocytosis: No; Hypertension: Yes - Father,Siblings; Kidney Disease: No; Lung  Disease: Yes - Siblings; Seizures: No; Stroke: No; Thyroid Problems: No; Tuberculosis: No; Former smoker - quit 1991; Marital Status - Married; Alcohol Use: Moderate; Drug Use: No History; Caffeine Use: Daily - coffee; Financial Concerns: No; Food, Clothing or Shelter Needs: No; Support System Lacking: No; Transportation Concerns: No Electronic Signature(s) Signed: 02/04/2023 11:57:43 AM By: Duanne Guess MD FACS Entered By: Duanne Guess on 02/04/2023 11:49:05 -------------------------------------------------------------------------------- SuperBill Details Patient Name: Date of Service: DAYSEAN, DIGGINS 02/04/2023 Medical Record Number: [id number] Patient Account Number: [id number] Date of Birth/Sex: Treating RN: 1950-07-28 (72 y.o. 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) Renly, Debaun Grey ([id number]) [id number].pdf Page 16 of 16 I89.0 Lymphedema, not elsewhere classified I10 Essential (primary) hypertension I25.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 angina pectoris Z86.718 Personal history of other venous thrombosis and embolism Facility Procedures : CPT4 Code: [id number] Description: 97597 - DEBRIDE WOUND 1ST 20 SQ CM OR < ICD-10 Diagnosis Description 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 Modifier: ed Quantity: 1 Physician Procedures : CPT4 Code Description Modifier [id number] - WC PHYS LEVEL 4 - EST PT 25 ICD-10 Diagnosis Description 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

## 2023-02-04 NOTE — Progress Notes (Signed)
[name] ([id number]) [id number] Page 1 of 9 Visit Report for 02/04/2023 Arrival Information Details Patient Name: Date of Service: [name], [name] 02/04/2023 11:00 A M Medical Record Number: [id number] Patient Account Number: [id number] Date of Birth/Sex: Treating RN: 11-Dec-1950 (72 y.o. M) Primary Care Jeda Pardue: [name] Other Clinician: Referring Shalina Norfolk: Treating Blaize Nipper/Extender: [name] in Treatment: 82 Visit Information History Since Last Visit All ordered tests and consults were completed: No Patient Arrived: Dan Humphreys Added or deleted any medications: No Arrival Time: 10:52 Any new allergies or adverse reactions: No Accompanied By: self Had a fall or experienced change in No Transfer Assistance: None activities of daily living that may affect Patient Identification Verified: Yes risk of falls: Secondary Verification Process Completed: Yes Signs or symptoms of abuse/neglect since last visito No Patient Requires Transmission-Based Precautions: No Hospitalized since last visit: No Patient Has Alerts: Yes Implantable device outside of the clinic excluding No Patient Alerts: R ABI= N/C, TBI= .75 cellular tissue based products placed in the center L ABI =N/C, TBI = .57 since last visit: Has Dressing in Place as Prescribed: Yes Has Compression in Place as Prescribed: Yes Pain Present Now: No Electronic Signature(s) Signed: 02/04/2023 12:03:07 PM By: Zenaida Deed RN, BSN Entered By: Zenaida Deed on 02/04/2023 11:16:11 -------------------------------------------------------------------------------- Compression Therapy Details Patient Name: Date of Service: [name] 02/04/2023 11:00 A M Medical Record Number: [id number] Patient Account Number: [id number] Date of Birth/Sex: Treating RN: 1950/10/09 (72 y.o. [name] Primary Care Krizia Flight: [name] Other Clinician: Referring  Lema Heinkel: Treating Gilverto Dileonardo/Extender: [name] in Treatment: 49 Compression Therapy Performed for Wound Assessment: Wound #3 Right,Medial Lower Leg Performed By: Clinician Zenaida Deed, RN Compression Type: Three Layer Post Procedure Diagnosis Same as Pre-procedure Electronic Signature(s) Signed: 02/04/2023 12:03:07 PM By: Zenaida Deed RN, BSN Entered By: Zenaida Deed on 02/04/2023 11:31:30 [name] ([id number]) [id number] Page 2 of 9 -------------------------------------------------------------------------------- Encounter Discharge Information Details Patient Name: Date of Service: [name] 02/04/2023 11:00 A M Medical Record Number: [id number] Patient Account Number: [id number] Date of Birth/Sex: Treating RN: 10/01/1950 (72 y.o. [name] Primary Care Licia Harl: [name] Other Clinician: Referring Moroni Nester: Treating Minami Arriaga/Extender: [name] in Treatment: 39 Encounter Discharge Information Items Post Procedure Vitals Discharge Condition: Stable Temperature (F): 98.6 Ambulatory Status: Walker Pulse (bpm): 78 Discharge Destination: Home Respiratory Rate (breaths/min): 18 Transportation: Private Auto Blood Pressure (mmHg): 159/79 Accompanied By: self Schedule Follow-up Appointment: Yes Clinical Summary of Care: Patient Declined Electronic Signature(s) Signed: 02/04/2023 12:03:07 PM By: Zenaida Deed RN, BSN Entered By: Zenaida Deed on 02/04/2023 12:02:38 -------------------------------------------------------------------------------- Lower Extremity Assessment Details Patient Name: Date of Service: [name] 02/04/2023 11:00 A M Medical Record Number: [id number] Patient Account Number: [id number] Date of Birth/Sex: Treating RN: Jul 12, 1951 (72 y.o. [name] Primary Care Nels Munn: [name] Other Clinician: Referring  Cordaryl Decelles: Treating Maliea Grandmaison/Extender: [name] in Treatment: 51 Edema Assessment Assessed: Kyra Searles: No] Franne Forts: No] Edema: [Left: Yes] [Right: Yes] Calf Left: Right: Point of Measurement: From Medial Instep 44 cm Ankle Left: Right: Point of Measurement: From Medial Instep 28 cm Vascular Assessment Pulses: Dorsalis Pedis Palpable: [Right:Yes] Extremity colors, hair growth, and conditions: Extremity Color: [Left:Hyperpigmented] [Right:Hyperpigmented] Hair Growth on Extremity: [Right:Yes] Temperature of Extremity: [Right:Warm < 3 seconds] Electronic Signature(s) Signed: 02/04/2023 12:03:07 PM By: Zenaida Deed RN, BSN Entered By: Zenaida Deed on 02/04/2023 11:21:32 [name] ([id number]) [id number] Page 3 of 9 -------------------------------------------------------------------------------- Multi Wound Chart Details Patient Name: Date  of Service: [name] 02/04/2023 11:00 A M Medical Record Number: [id number] Patient Account Number: [id number] Date of Birth/Sex: Treating RN: 09-Dec-1950 (72 y.o. M) Primary Care Lauris Serviss: [name] Other Clinician: Referring Camaria Gerald: Treating Deliliah Spranger/Extender: [name] in Treatment: 51 Vital Signs Height(in): 71 Pulse(bpm): 78 Weight(lbs): 267 Blood Pressure(mmHg): 159/79 Body Mass Index(BMI): 37.2 Temperature(F): 98.0 Respiratory Rate(breaths/min): 18 [3:Photos: No Photos Right, Medial Lower Leg Wound Location: Gradually Appeared Wounding Event: Lymphedema Primary Etiology: Cataracts, Anemia, Lymphedema, Comorbid History: Congestive Heart Failure, Deep Vein Congestive Heart Failure, Deep Vein Thrombosis,  Hypertension, Peripheral Thrombosis, Hypertension, Peripheral Venous Disease 12/07/2021 Date Acquired: 51 Weeks of Treatment: Open Wound Status: No Wound Recurrence: No Clustered Wound: N/A Clustered Quantity: 9x4.3x0.4 Measurements L x W x D  (cm) 30.395  A (cm) : rea 12.158 Volume (cm) : -469.10% % Reduction in A rea: -469.20% % Reduction in Volume: Full Thickness With Exposed Support Full Thickness Without Exposed Classification: Structures Large Exudate A mount: Serosanguineous Exudate Type: red, brown  Exudate Color: Distinct, outline attached Wound Margin: Small (1-33%) Granulation A mount: Red Granulation Quality: Large (67-100%) Necrotic A mount: Fat Layer (Subcutaneous Tissue): Yes Fat Layer (Subcutaneous Tissue): Yes N/A Exposed Structures:  Fascia: No Tendon: No Muscle: No Joint: No Bone: No Medium (34-66%) Epithelialization: Debridement - Selective/Open Wound Debridement - Selective/Open Wound N/A Debridement: Pre-procedure Verification/Time Out 11:30 Taken: Lidocaine 4% Topical Solution  Pain Control: Slough Tissue Debrided: Non-Viable Tissue Level: 12.15 Debridement A (sq cm): rea Curette Instrument: Minimum Bleeding: Pressure Hemostasis A chieved: 3 Procedural Pain: 2 Post Procedural Pain: Procedure was tolerated well Debridement  Treatment Response: 9x4.3x0.4 Post Debridement Measurements L x W x D (cm) 12.158 Post Debridement Volume: (cm) No Abnormalities Noted Periwound Skin Texture: No Abnormalities Noted Periwound Skin Moisture: Erythema: Yes Periwound Skin Color: Hemosiderin  Staining: Yes] [6:No Photos Right, Anterior Lower Leg Gradually Appeared Calcinosis Cataracts, Anemia, Lymphedema, Venous Disease 05/12/2022 38 Open No Yes 2 1.5x0.5x0.3 0.589 0.177 -149.60% -637.50% Support Structures Medium Serosanguineous red, brown  Distinct, outline attached Small (1-33%) Red Small (1-33%) Fascia: No Tendon: No Muscle: No Joint: No Bone: No Medium (34-66%) 11:30 Lidocaine 4% Topical Solution Slough Non-Viable Tissue 0.09 Curette Minimum Pressure 3 2 Procedure was tolerated well  1.5x0.3x0.2 0.071 No Abnormalities Noted No Abnormalities Noted] [N/A:N/A N/A N/A N/A N/A N/A N/A N/A N/A N/A N/A N/A N/A N/A N/A N/A N/A N/A N/A N/A N/A N/A  N/A N/A N/A N/A N/A N/A N/A N/A N/A N/A N/A N/A N/A N/A N/A N/A N/A N/A N/A] Glazebrook, Joshva ([id number]) [3:No Abnormality Temperature: N/A Assessment Notes: Compression Therapy Procedures Performed: Debridement] [6:No Abnormality probes to calcium deposits N/A] [[id number] Page 4 of 9 N/A N/A N/A] Treatment Notes Electronic Signature(s) Signed: 02/04/2023 11:47:50 AM By: Duanne Guess MD FACS Entered By: Duanne Guess on 02/04/2023 11:47:50 -------------------------------------------------------------------------------- Multi-Disciplinary Care Plan Details Patient Name: Date of Service: KENTRAVIOUS, BRUINSMA 02/04/2023 11:00 A M Medical Record Number: [id number] Patient Account Number: [id number] Date of Birth/Sex: Treating RN: 1951-07-21 (72 y.o. [name] Primary Care Julion Gatt: [name] Other Clinician: Referring Kwynn Schlotter: Treating Shawni Volkov/Extender: [name] in Treatment: 35 Multidisciplinary Care Plan reviewed with physician Active Inactive Venous Leg Ulcer Nursing Diagnoses: Knowledge deficit related to disease process and management Potential for venous Insuffiency (use before diagnosis confirmed) Goals: Patient will maintain optimal edema control Date Initiated: 02/17/2022 Target Resolution Date: 02/23/2023 Goal Status: Active Interventions: Assess peripheral edema status every visit. Compression as ordered Provide education on venous  insufficiency Treatment Activities: Non-invasive vascular studies : 02/17/2022 Therapeutic compression applied : 02/17/2022 Notes: Wound/Skin Impairment Nursing Diagnoses: Impaired tissue integrity Knowledge deficit related to ulceration/compromised skin integrity Goals: Patient/caregiver will verbalize understanding of skin care regimen Date Initiated: 02/17/2022 Target Resolution Date: 02/23/2023 Goal Status: Active Ulcer/skin breakdown will have a volume reduction of  30% by week 4 Date Initiated: 02/17/2022 Date Inactivated: 03/10/2022 Target Resolution Date: 03/10/2022 Unmet Reason: infection being treated, Goal Status: Unmet waiting on derm appte Ulcer/skin breakdown will have a volume reduction of 50% by week 8 Date Initiated: 03/10/2022 Date Inactivated: 04/14/2022 Target Resolution Date: 04/07/2022 Goal Status: Unmet Unmet Reason: calcinosis Ulcer/skin breakdown will have a volume reduction of 80% by week 12 Date Initiated: 04/14/2022 Date Inactivated: 05/05/2022 Target Resolution Date: 05/05/2022 Unmet Reason: sadler, malinak ([id number]) [id number] Page 5 of 9 Unmet Reason: infection, chronic Goal Status: Unmet condition Interventions: Assess patient/caregiver ability to obtain necessary supplies Assess patient/caregiver ability to perform ulcer/skin care regimen upon admission and as needed Assess ulceration(s) every visit Provide education on ulcer and skin care Treatment Activities: Skin care regimen initiated : 02/17/2022 Topical wound management initiated : 02/17/2022 Notes: Electronic Signature(s) Signed: 02/04/2023 12:03:07 PM By: Zenaida Deed RN, BSN Entered By: Zenaida Deed on 02/04/2023 11:27:21 -------------------------------------------------------------------------------- Pain Assessment Details Patient Name: Date of Service: RILLEY, WAITE 02/04/2023 11:00 A M Medical Record Number: [id number] Patient Account Number: [id number] Date of Birth/Sex: Treating RN: 1951/04/27 (72 y.o. M) Primary Care Waymond Meador: [name] Other Clinician: Referring Lissandro Dilorenzo: Treating Marylyn Appenzeller/Extender: [name] in Treatment: 35 Active Problems Location of Pain Severity and Description of Pain Patient Has Paino No Site Locations Rate the pain. Current Pain Level: 0 Pain Management and Medication Current Pain Management: Electronic Signature(s) Signed: 02/04/2023  12:03:07 PM By: Zenaida Deed RN, BSN Entered By: Zenaida Deed on 02/04/2023 11:17:56 [name] ([id number]) [id number] Page 6 of 9 -------------------------------------------------------------------------------- Patient/Caregiver Education Details Patient Name: Date of Service: SHANARD, DUTTA 7/12/2024andnbsp11:00 A M Medical Record Number: [id number] Patient Account Number: [id number] Date of Birth/Gender: Treating RN: 1951-01-01 (72 y.o. [name] Primary Care Physician: [name] Other Clinician: Referring Physician: Treating Physician/Extender: [name] in Treatment: 2 Education Assessment Education Provided To: Patient Education Topics Provided Venous: Methods: Explain/Verbal Responses: Reinforcements needed, State content correctly Wound/Skin Impairment: Methods: Explain/Verbal Responses: Reinforcements needed, State content correctly Electronic Signature(s) Signed: 02/04/2023 12:03:07 PM By: Zenaida Deed RN, BSN Entered By: Zenaida Deed on 02/04/2023 11:27:47 -------------------------------------------------------------------------------- Wound Assessment Details Patient Name: Date of Service: KATRIEL, HAILES 02/04/2023 11:00 A M Medical Record Number: [id number] Patient Account Number: [id number] Date of Birth/Sex: Treating RN: [birth year] (72 y.o. [name] Primary Care Barabara Motz: [name] Other Clinician: Referring Germaine Ripp: Treating Jaelle Campanile/Extender: [name] in Treatment: 51 Wound Status Wound Number: 3 Primary Lymphedema Etiology: Wound Location: Right, Medial Lower Leg Wound Open Wounding Event: Gradually Appeared Status: Date Acquired: 12/07/2021 Comorbid Cataracts, Anemia, Lymphedema, Congestive Heart Failure, Deep Weeks Of Treatment: 51 History: Vein Thrombosis, Hypertension, Peripheral Venous Disease Clustered Wound: No Wound  Measurements Length: (cm) 9 Width: (cm) 4.3 Depth: (cm) 0.4 Area: (cm) 30.395 Volume: (cm) 12.158 % Reduction in Area: -469.1% % Reduction in Volume: -469.2% Epithelialization: Medium (34-66%) Tunneling: No Undermining: No Wound Description Classification: Full Thickness With Exposed Suppo Wound Margin: Distinct, outline attached Exudate Amount: Large Exudate Type: Serosanguineous Exudate Color: red, brown rt Structures Foul Odor After Cleansing: No Slough/Fibrino Yes Wound Bed Granulation Amount: Small (1-33%) Exposed Structure  Granulation Quality: Red Fascia Exposed: No Necrotic Amount: Large (67-100%) Fat Layer (Subcutaneous Tissue) Exposed: Yes Necrotic Quality: Adherent Slough Tendon Exposed: No SYLYS, SOBIERAJ ([id number]) [id number] Page 7 of 9 Muscle Exposed: No Joint Exposed: No Bone Exposed: No Periwound Skin Texture Texture Color No Abnormalities Noted: Yes No Abnormalities Noted: No Erythema: Yes Moisture Hemosiderin Staining: Yes No Abnormalities Noted: Yes Temperature / Pain Temperature: No Abnormality Treatment Notes Wound #3 (Lower Leg) Wound Laterality: Right, Medial Cleanser Soap and Water Discharge Instruction: May shower and wash wound with dial antibacterial soap and water prior to dressing change. Vashe 5.8 (oz) Discharge Instruction: Cleanse the wound with Vashe prior to applying a clean dressing using gauze sponges, not tissue or cotton balls. Peri-Wound Care Ketoconazole Cream 2% Discharge Instruction: Apply Ketoconazole mixed with zinc to irritated periwound skin Zinc Oxide Ointment 30g tube Discharge Instruction: Apply Zinc Oxide to periwound with each dressing change Sween Lotion (Moisturizing lotion) Discharge Instruction: Apply moisturizing lotion as directed Topical Gentamicin Discharge Instruction: mixed 50:50 with mupirocin to wound bed Mupirocin Ointment Discharge Instruction: Apply Mupirocin  (Bactroban) as instructed Primary Dressing Hydrofera Blue Ready Transfer Foam, 4x5 (in/in) Discharge Instruction: Apply to wound bed if urgo clean unavailable UrgoClean AG 4 X 5 (in/in) Discharge Instruction: Apply directly to wound bed. Secondary Dressing ABD Pad, 8x10 Discharge Instruction: Apply over primary dressing as directed. Zetuvit Plus 4x8 in Discharge Instruction: Apply over primary dressing as directed. Secured With Compression Wrap ThreePress (3 layer compression wrap) Discharge Instruction: Apply three layer compression as directed. Compression Stockings Add-Ons Electronic Signature(s) Signed: 02/04/2023 12:03:07 PM By: Zenaida Deed RN, BSN Entered By: Zenaida Deed on 02/04/2023 11:25:09 Wound Assessment Details -------------------------------------------------------------------------------- [name] ([id number]) [id number] Page 8 of 9 Patient Name: Date of Service: CLARANCE, THAGGARD 02/04/2023 11:00 A M Medical Record Number: [id number] Patient Account Number: [id number] Date of Birth/Sex: Treating RN: Jan 10, 1951 (72 y.o. [name] Primary Care Hermina Barnard: [name] Other Clinician: Referring Cambell Rickenbach: Treating Ubaldo Daywalt/Extender: [name] in Treatment: 51 Wound Status Wound Number: 6 Primary Calcinosis Etiology: Wound Location: Right, Anterior Lower Leg Wound Open Wounding Event: Gradually Appeared Status: Date Acquired: 05/12/2022 Comorbid Cataracts, Anemia, Lymphedema, Congestive Heart Failure, Deep Weeks Of Treatment: 38 History: Vein Thrombosis, Hypertension, Peripheral Venous Disease Clustered Wound: Yes Wound Measurements Length: (cm) 1.5 % Reduction in Area: -149.6% Width: (cm) 0.5 % Reduction in Volume: -637.5% Depth: (cm) 0.3 Epithelialization: Medium (34-66%) Clustered Quantity: 2 Tunneling: No Area: (cm) 0.589 Undermining: No Volume: (cm) 0.177 Wound  Description Classification: Full Thickness Without Exposed Support Structures Wound Margin: Distinct, outline attached Exudate Amount: Medium Exudate Type: Serosanguineous Exudate Color: red, brown Wound Bed Granulation Amount: Small (1-33%) Exposed Structure Granulation Quality: Red Fascia Exposed: No Necrotic Amount: Small (1-33%) Fat Layer (Subcutaneous Tissue) Exposed: Yes Necrotic Quality: Adherent Slough Tendon Exposed: No Muscle Exposed: No Joint Exposed: No Bone Exposed: No Periwound Skin Texture Texture Color No Abnormalities Noted: Yes No Abnormalities Noted: No Hemosiderin Staining: Yes Moisture No Abnormalities Noted: Yes Temperature / Pain Temperature: No Abnormality Assessment Notes probes to calcium deposits Treatment Notes Wound #6 (Lower Leg) Wound Laterality: Right, Anterior Cleanser Peri-Wound Care Sween Lotion (Moisturizing lotion) Discharge Instruction: Apply moisturizing lotion as directed Topical Gentamicin Discharge Instruction: As directed by physician Mupirocin Ointment Discharge Instruction: Apply Mupirocin (Bactroban) as instructed Primary Dressing UrgoClean AG 4 X 5 (in/in) Discharge Instruction: Apply directly to wound bed. Secondary Dressing Woven Gauze Sponge, Non-Sterile 4x4 in Discharge Instruction: Apply over primary dressing as directed. Skorupski, Mykai (  [id number]) [id number] Page 9 of 9 Secured With Compression Wrap ThreePress (3 layer compression wrap) Discharge Instruction: Apply three layer compression as directed. Compression Stockings Add-Ons Electronic Signature(s) Signed: 02/04/2023 12:03:07 PM By: Zenaida Deed RN, BSN Entered By: Zenaida Deed on 02/04/2023 11:26:03 -------------------------------------------------------------------------------- Vitals Details Patient Name: Date of Service: [name] 02/04/2023 11:00 A M Medical Record Number: [id number] Patient Account Number:  [id number] Date of Birth/Sex: Treating RN: 1950-09-25 (72 y.o. M) Primary Care Rodrigo Mcgranahan: [name] Other Clinician: Referring Kishan Wachsmuth: Treating Lajeana Strough/Extender: [name] in Treatment: 51 Vital Signs Time Taken: 10:52 Temperature (F): 98.0 Height (in): 71 Pulse (bpm): 78 Weight (lbs): 267 Respiratory Rate (breaths/min): 18 Body Mass Index (BMI): 37.2 Blood Pressure (mmHg): 159/79 Reference Range: 80 - 120 mg / dl Electronic Signature(s) Signed: 02/04/2023 12:03:07 PM By: Zenaida Deed RN, BSN Entered By: Zenaida Deed on 02/04/2023 11:17:38

## 2023-02-08 ENCOUNTER — Encounter (HOSPITAL_BASED_OUTPATIENT_CLINIC_OR_DEPARTMENT_OTHER): Payer: Medicare Other | Admitting: General Surgery

## 2023-02-08 DIAGNOSIS — I872 Venous insufficiency (chronic) (peripheral): Secondary | ICD-10-CM | POA: Diagnosis not present

## 2023-02-08 DIAGNOSIS — I890 Lymphedema, not elsewhere classified: Secondary | ICD-10-CM | POA: Diagnosis not present

## 2023-02-08 DIAGNOSIS — I509 Heart failure, unspecified: Secondary | ICD-10-CM | POA: Diagnosis not present

## 2023-02-08 DIAGNOSIS — I110 Hypertensive heart disease with heart failure: Secondary | ICD-10-CM | POA: Diagnosis not present

## 2023-02-08 DIAGNOSIS — L97812 Non-pressure chronic ulcer of other part of right lower leg with fat layer exposed: Secondary | ICD-10-CM | POA: Diagnosis not present

## 2023-02-08 DIAGNOSIS — L942 Calcinosis cutis: Secondary | ICD-10-CM | POA: Diagnosis not present

## 2023-02-10 NOTE — Progress Notes (Signed)
AARIB, PULIDO ([id number]) [id number] Page 1 of 1 Visit Report for 02/08/2023 SuperBill Details Patient Name: Date of Service: [name], [name] 02/08/2023 Medical Record Number: [id number] Patient Account Number: [id number] Date of Birth/Sex: Treating RN: July 02, 1951 (72 y.o. 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

## 2023-02-10 NOTE — Progress Notes (Signed)
[name], [name] ([id number]) [id number] Page 1 of 5 Visit Report for 02/08/2023 Arrival Information Details Patient Name: Date of Service: [name], [name] 02/08/2023 11:15 A M Medical Record Number: [id number] Patient Account Number: [id number] Date of Birth/Sex: Treating RN: [birth year] (72 y.o. [name] Primary Care [name]: [name] Other Clinician: Referring [name]: Treating [name]/Extender: [name] in Treatment: 39 Visit Information History Since Last Visit Added or deleted any medications: No Patient Arrived: Wheel Chair Any new allergies or adverse reactions: No Arrival Time: 11:30 Had a fall or experienced change in No Accompanied By: self activities of daily living that may affect Transfer Assistance: None risk of falls: Patient Identification Verified: Yes Signs or symptoms of abuse/neglect since last visito No Secondary Verification Process Completed: Yes Hospitalized since last visit: No Patient Requires Transmission-Based Precautions: No Implantable device outside of the clinic excluding No Patient Has Alerts: Yes cellular tissue based products placed in the center Patient Alerts: R ABI= N/C, TBI= .75 since last visit: L ABI =N/C, TBI = .57 Has Dressing in Place as Prescribed: Yes Has Compression in Place as Prescribed: Yes Pain Present Now: No Electronic Signature(s) Signed: 02/09/2023 5:33:12 PM By: Zenaida Deed RN, BSN Entered By: Zenaida Deed on 02/08/2023 17:21:09 -------------------------------------------------------------------------------- Compression Therapy Details Patient Name: Date of Service: [name] 02/08/2023 11:15 A M Medical Record Number: [id number] Patient Account Number: [id number] Date of Birth/Sex: Treating RN: Feb 07, 1951 (73 y.o. [name] Primary Care Ely Ballen: [name] Other Clinician: Referring [name]: Treating [name]/Extender: [name] in Treatment: 52 Compression Therapy Performed for Wound Assessment: Wound #3 Right,Medial Lower Leg Performed By: Clinician Zenaida Deed, RN Compression Type: Three Emergency planning/management officer) Signed: 02/09/2023 5:33:12 PM By: Zenaida Deed RN, BSN Entered By: Zenaida Deed on 02/08/2023 17:21:56 -------------------------------------------------------------------------------- Encounter Discharge Information Details Patient Name: Date of Service: [name] 02/08/2023 11:15 A M Medical Record Number: [id number] Patient Account Number: [id number] [name], [name] ([id number]) [id number] Page 2 of 5 Date of Birth/Sex: Treating RN: Jan 17, 1951 (72 y.o. [name] Primary Care [name]: Other Clinician: Nicoletta [name] Referring [name]: Treating [name]/Extender: [name] in Treatment: 76 Encounter Discharge Information Items Discharge Condition: Stable Ambulatory Status: Wheelchair Discharge Destination: Home Transportation: Private Auto Accompanied By: self Schedule Follow-up Appointment: Yes Clinical Summary of Care: Patient Declined Electronic Signature(s) Signed: 02/09/2023 5:33:12 PM By: Zenaida Deed RN, BSN Entered By: Zenaida Deed on 02/08/2023 17:22:52 -------------------------------------------------------------------------------- Patient/Caregiver Education Details Patient Name: Date of Service: [name], [name] 7/16/2024andnbsp11:15 A M Medical Record Number: [id number] Patient Account Number: [id number] Date of Birth/Gender: Treating RN: May 24, 1951 (72 y.o. [name] Primary Care Physician: [name] Other Clinician: Referring Physician: Treating Physician/Extender: [name] in Treatment: 55 Education Assessment Education Provided To: Patient Education Topics Provided Venous: Methods: Explain/Verbal Responses:  Reinforcements needed, State content correctly Electronic Signature(s) Signed: 02/09/2023 5:33:12 PM By: Zenaida Deed RN, BSN Entered By: Zenaida Deed on 02/08/2023 17:22:36 -------------------------------------------------------------------------------- Wound Assessment Details Patient Name: Date of Service: [name], [name] 02/08/2023 11:15 A M Medical Record Number: [id number] Patient Account Number: [id number] Date of Birth/Sex: Treating RN: 12/21/1950 (72 y.o. [name] Primary Care [name]: [name] Other Clinician: Referring [name]: Treating [name]/Extender: [name] in Treatment: 52 Wound Status Wound Number: 3 Primary Etiology: Lymphedema Wound Location: Right, Medial Lower Leg Wound Status: Open Wounding Event: Gradually Appeared Date Acquired: 12/07/2021 [name], [name] ([id number]) [id number] Page 3 of 5 Weeks Of Treatment: 52 Clustered  Wound: No Wound Measurements Length: (cm) 9 Width: (cm) 4.3 Depth: (cm) 0.4 Area: (cm) 30.395 Volume: (cm) 12.158 % Reduction in Area: -469.1% % Reduction in Volume: -469.2% Wound Description Classification: Full Thickness With Exposed Support Exudate Amount: Large Exudate Type: Serosanguineous Exudate Color: red, brown Structures Periwound Skin Texture Texture Color No Abnormalities Noted: No No Abnormalities Noted: No Moisture No Abnormalities Noted: No Treatment Notes Wound #3 (Lower Leg) Wound Laterality: Right, Medial Cleanser Soap and Water Discharge Instruction: May shower and wash wound with dial antibacterial soap and water prior to dressing change. Vashe 5.8 (oz) Discharge Instruction: Cleanse the wound with Vashe prior to applying a clean dressing using gauze sponges, not tissue or cotton balls. Peri-Wound Care Ketoconazole Cream 2% Discharge Instruction: Apply Ketoconazole mixed with zinc to irritated periwound skin Zinc Oxide  Ointment 30g tube Discharge Instruction: Apply Zinc Oxide to periwound with each dressing change Sween Lotion (Moisturizing lotion) Discharge Instruction: Apply moisturizing lotion as directed Topical Gentamicin Discharge Instruction: mixed 50:50 with mupirocin to wound bed Mupirocin Ointment Discharge Instruction: Apply Mupirocin (Bactroban) as instructed Primary Dressing UrgoClean AG 4 X 5 (in/in) Discharge Instruction: Apply directly to wound bed. Secondary Dressing ABD Pad, 8x10 Discharge Instruction: Apply over primary dressing as directed. Zetuvit Plus 4x8 in Discharge Instruction: Apply over primary dressing as directed. Secured With Compression Wrap ThreePress (3 layer compression wrap) Discharge Instruction: Apply three layer compression as directed. Compression Stockings Add-Ons Electronic Signature(s) Signed: 02/09/2023 5:33:12 PM By: Zenaida Deed RN, BSN Entered By: Zenaida Deed on 02/08/2023 17:21:33 [name] ([id number]) [id number] Page 4 of 5 -------------------------------------------------------------------------------- Wound Assessment Details Patient Name: Date of Service: [name], [name] 02/08/2023 11:15 A M Medical Record Number: [id number] Patient Account Number: [id number] Date of Birth/Sex: Treating RN: 05-08-1951 (72 y.o. [name] Primary Care Anikin Prosser: [name] Other Clinician: Referring Lowen Mansouri: Treating Catherin Doorn/Extender: [name] in Treatment: 52 Wound Status Wound Number: 6 Primary Etiology: Calcinosis Wound Location: Right, Anterior Lower Leg Wound Status: Open Wounding Event: Gradually Appeared Date Acquired: 05/12/2022 Weeks Of Treatment: 38 Clustered Wound: Yes Wound Measurements Length: (cm) 1.5 Width: (cm) 0.5 Depth: (cm) 0.3 Area: (cm) 0.589 Volume: (cm) 0.177 % Reduction in Area: -149.6% % Reduction in Volume: -637.5% Wound  Description Classification: Full Thickness Without Exposed Suppor Exudate Amount: Medium Exudate Type: Serosanguineous Exudate Color: red, brown t Structures Periwound Skin Texture Texture Color No Abnormalities Noted: No No Abnormalities Noted: No Moisture No Abnormalities Noted: No Treatment Notes Wound #6 (Lower Leg) Wound Laterality: Right, Anterior Cleanser Peri-Wound Care Sween Lotion (Moisturizing lotion) Discharge Instruction: Apply moisturizing lotion as directed Topical Gentamicin Discharge Instruction: As directed by physician Mupirocin Ointment Discharge Instruction: Apply Mupirocin (Bactroban) as instructed Primary Dressing UrgoClean AG 4 X 5 (in/in) Discharge Instruction: Apply directly to wound bed. Secondary Dressing Woven Gauze Sponge, Non-Sterile 4x4 in Discharge Instruction: Apply over primary dressing as directed. Secured With Compression Wrap ThreePress (3 layer compression wrap) Discharge Instruction: Apply three layer compression as directed. Compression Stockings [name], [name] ([id number]) [id number] Page 5 of 5 Add-Ons Electronic Signature(s) Signed: 02/09/2023 5:33:12 PM By: Zenaida Deed RN, BSN Entered By: Zenaida Deed on 02/08/2023 17:21:33

## 2023-02-11 ENCOUNTER — Encounter (HOSPITAL_BASED_OUTPATIENT_CLINIC_OR_DEPARTMENT_OTHER): Payer: Medicare Other | Admitting: General Surgery

## 2023-02-11 DIAGNOSIS — L942 Calcinosis cutis: Secondary | ICD-10-CM | POA: Diagnosis not present

## 2023-02-11 DIAGNOSIS — I509 Heart failure, unspecified: Secondary | ICD-10-CM | POA: Diagnosis not present

## 2023-02-11 DIAGNOSIS — I872 Venous insufficiency (chronic) (peripheral): Secondary | ICD-10-CM | POA: Diagnosis not present

## 2023-02-11 DIAGNOSIS — I890 Lymphedema, not elsewhere classified: Secondary | ICD-10-CM | POA: Diagnosis not present

## 2023-02-11 DIAGNOSIS — L97812 Non-pressure chronic ulcer of other part of right lower leg with fat layer exposed: Secondary | ICD-10-CM | POA: Diagnosis not present

## 2023-02-11 DIAGNOSIS — I110 Hypertensive heart disease with heart failure: Secondary | ICD-10-CM | POA: Diagnosis not present

## 2023-02-14 NOTE — Progress Notes (Addendum)
TAVARES, LEVINSON ([id number]) [id number] Page 1 of 9 Visit Report for 02/11/2023 Arrival Information Details Patient Name: Date of Service: [name], [name] 02/11/2023 10:30 A M Medical Record Number: [id number] Patient Account Number: [id number] Date of Birth/Sex: Treating RN: June 29, 1951 (72 y.o. [name] Primary Care Tinika Bucknam: [name] Other Clinician: Referring [name]: Treating [name]/Extender: [name] in Treatment: 57 Visit Information History Since Last Visit Added or deleted any medications: No Patient Arrived: Wheel Chair Any new allergies or adverse reactions: No Arrival Time: 08:38 Had a fall or experienced change in No Accompanied By: Self activities of daily living that may affect Transfer Assistance: Manual risk of falls: Patient Identification Verified: Yes Signs or symptoms of abuse/neglect since last visito No Secondary Verification Process Completed: Yes Hospitalized since last visit: No Patient Requires Transmission-Based Precautions: No Implantable device outside of the clinic excluding No Patient Has Alerts: Yes cellular tissue based products placed in the center Patient Alerts: R ABI= N/C, TBI= .75 since last visit: L ABI =N/C, TBI = .57 Has Dressing in Place as Prescribed: Yes Has Compression in Place as Prescribed: Yes Pain Present Now: No Electronic Signature(s) Signed: 02/14/2023 4:45:25 PM By: [name] Entered By: [name] on 02/14/2023 08:39:12 -------------------------------------------------------------------------------- Compression Therapy Details Patient Name: Date of Service: [name] 02/11/2023 10:30 A M Medical Record Number: [id number] Patient Account Number: [id number] Date of Birth/Sex: Treating RN: 10/07/1950 (72 y.o. [name] Primary Care Lincy Belles: [name] Other Clinician: Referring [name]: Treating [name]/Extender:  [name] in Treatment: 52 Compression Therapy Performed for Wound Assessment: Wound #3 Right,Medial Lower Leg Performed By: Clinician [name] Bruin, RN Compression Type: Three Layer Post Procedure Diagnosis Same as Pre-procedure Electronic Signature(s) Signed: 02/14/2023 4:45:25 PM By: [name] Entered By: [name] on 02/14/2023 08:42:53 [name] ([id number]) [id number] Page 2 of 9 -------------------------------------------------------------------------------- Compression Therapy Details Patient Name: Date of Service: [name], [name] 02/11/2023 10:30 A M Medical Record Number: [id number] Patient Account Number: [id number] Date of Birth/Sex: Treating RN: 05-21-1951 (72 y.o. [name] Primary Care Dyanara Cozza: [name] Other Clinician: Referring Saamir [name]: Treating [name]/Extender: [name] in Treatment: 52 Compression Therapy Performed for Wound Assessment: NonWound Condition Lymphedema - Left Leg Performed By: Clinician [name] Bruin, RN Compression Type: Three Layer Post Procedure Diagnosis Same as Pre-procedure Electronic Signature(s) Signed: 02/14/2023 4:45:25 PM By: [name] Entered By: [name] on 02/14/2023 08:43:04 -------------------------------------------------------------------------------- Encounter Discharge Information Details Patient Name: Date of Service: [name] 02/11/2023 10:30 A M Medical Record Number: [id number] Patient Account Number: [id number] Date of Birth/Sex: Treating RN: 1950/10/16 (72 y.o. [name] Primary Care Naylene Foell: [name] Other Clinician: Referring Elizebath Wever: Treating Zadie [name]/Extender: [name] in Treatment: 77 Encounter Discharge Information Items Post Procedure Vitals Discharge Condition: Stable Temperature (F): 97.9 Ambulatory  Status: Wheelchair Pulse (bpm): 61 Discharge Destination: Home Respiratory Rate (breaths/min): 18 Transportation: Private Auto Blood Pressure (mmHg): 141/71 Accompanied By: Self Schedule Follow-up Appointment: Yes Clinical Summary of Care: Patient Declined Electronic Signature(s) Signed: 02/14/2023 4:45:25 PM By: [name] Entered By: [name] on 02/14/2023 08:48:24 -------------------------------------------------------------------------------- Lower Extremity Assessment Details Patient Name: Date of Service: [name], [name] 02/11/2023 10:30 A M Medical Record Number: [id number] Patient Account Number: [id number] Date of Birth/Sex: Treating RN: 1950-12-14 (72 y.o. [name] Primary Care Nakyia Dau: [name] Other Clinician: Referring Cullen Vanallen: Treating Abagale Boulos/Extender: [name] in Treatment: 52 Edema Assessment Assessed: Kyra Searles: No] Franne Forts: No] Edema: [Left: Yes] [  Right: Yes] Calf TIMOUTHY, GILARDI ([id number]) [id number] Page 3 of 9 Left: Right: Point of Measurement: From Medial Instep 41 cm 43.5 cm Ankle Left: Right: Point of Measurement: From Medial Instep 25 cm 27 cm Vascular Assessment Pulses: Dorsalis Pedis Palpable: [Left:Yes] [Right:Yes] Extremity colors, hair growth, and conditions: Extremity Color: [Left:Hyperpigmented] [Right:Hyperpigmented] Hair Growth on Extremity: [Right:Yes] Temperature of Extremity: [Right:Warm < 3 seconds] Electronic Signature(s) Signed: 02/14/2023 4:45:25 PM By: [name] Entered By: [name] on 02/14/2023 08:40:22 -------------------------------------------------------------------------------- Multi Wound Chart Details Patient Name: Date of Service: [name] 02/11/2023 10:30 A M Medical Record Number: [id number] Patient Account Number: [id number] Date of Birth/Sex: Treating RN: 1951-03-23 (72 y.o. M) Primary Care Aceyn Kathol:  [name] Other Clinician: Referring Clifton Kovacic: Treating Harrel Ferrone/Extender: [name] in Treatment: 52 Vital Signs Height(in): 71 Pulse(bpm): 61 Weight(lbs): 267 Blood Pressure(mmHg): 141/71 Body Mass Index(BMI): 37.2 Temperature(F): 97.9 Respiratory Rate(breaths/min): 18 [3:Photos: No Photos Right, Medial Lower Leg Wound Location: Gradually Appeared Wounding Event: Lymphedema Primary Etiology: Cataracts, Anemia, Lymphedema, Comorbid History: Congestive Heart Failure, Deep Vein Congestive Heart Failure, Deep Vein Thrombosis,  Hypertension, Peripheral Thrombosis, Hypertension, Peripheral Venous Disease 12/07/2021 Date Acquired: 20 Weeks of Treatment: Open Wound Status: No Wound Recurrence: No Clustered Wound: 9.5x7x0.3 Measurements L x W x D (cm) 52.229 A (cm) : rea 15.669  Volume (cm) : -877.90% % Reduction in Area: -633.60% % Reduction in Volume: Full Thickness With Exposed Support Full Thickness Without Exposed Classification: Structures Large Exudate Amount: Serosanguineous Exudate Type: red, brown Exudate Color:  Distinct, outline attached Wound Margin: Small (1-33%) Granulation Amount: Red Granulation Quality: Large (67-100%) Necrotic Amount: Fat Layer (Subcutaneous Tissue): Yes Fat Layer (Subcutaneous Tissue): Yes N/A Exposed Structures:] [6:No Photos Right,  Anterior Lower Leg Gradually Appeared Calcinosis Cataracts, Anemia, Lymphedema, Venous Disease 05/12/2022 39 Open No Yes 2x1x0.2 1.571 0.314 -565.70% -1208.30% Support Structures Medium Serosanguineous red, brown Distinct, outline attached Small (1-33%)  Red Large (67-100%)] [N/A:N/A N/A N/A N/A N/A N/A N/A N/A N/A N/A N/A N/A N/A N/A N/A N/A N/A N/A N/A N/A N/A N/A N/A] [name], [name] ([id number]) [3:Fascia: No Tendon: No Muscle: No Joint: No Bone: No Small (1-33%) Epithelialization: Debridement - Selective/Open Wound Debridement: Pre-procedure Verification/Time Out 10:50 Taken: Lidocaine 4% Topical  Solution Pain Control: Slough Tissue Debrided:  Non-Viable Tissue Level: 31.32 Debridement A (sq cm): rea Curette Instrument: Minimum Bleeding: Pressure Hemostasis A chieved: Procedure was tolerated well Debridement Treatment Response: 9.5x7x0.3 Post Debridement Measurements L x W x D (cm) 15.669 Post  Debridement Volume: (cm) Excoriation: Yes Periwound Skin Texture: Maceration: Yes Periwound Skin Moisture: Rubor: Yes Periwound Skin Color: No Abnormality Temperature: Compression Therapy Procedures Performed: Debridement] [6:Fascia: No Tendon: No  Muscle: No Joint: No Bone: No Small (1-33%) - Selective/Open Wound N/A 10:50 Lidocaine 4% Topical Solution Slough Non-Viable Tissue 0.78 Curette Minimum Pressure Procedure was tolerated well 2x1x0.2 0.314 Excoriation: Yes Maceration: Yes Dry/Scaly: No  Rubor: Yes No Abnormality] [N/A:[id number] Page 4 of 9 N/A N/A N/A N/A N/A N/A N/A N/A N/A N/A N/A N/A N/A N/A N/A N/A N/A] Treatment Notes Wound #3 (Lower Leg) Wound Laterality: Right, Medial Cleanser Soap and Water Discharge Instruction: May shower and wash wound with dial antibacterial soap and water prior to dressing change. Vashe 5.8 (oz) Discharge Instruction: Cleanse the wound with Vashe prior to applying a clean dressing using gauze sponges, not tissue or cotton balls. Peri-Wound Care Ketoconazole Cream 2% Discharge Instruction: Apply Ketoconazole mixed with zinc to irritated periwound skin Zinc Oxide Ointment 30g tube Discharge Instruction:  Apply Zinc Oxide to periwound with each dressing change Sween Lotion (Moisturizing lotion) Discharge Instruction: Apply moisturizing lotion as directed Topical Gentamicin Discharge Instruction: mixed 50:50 with mupirocin to wound bed Mupirocin Ointment Discharge Instruction: Apply Mupirocin (Bactroban) as instructed Primary Dressing Hydrofera Blue Ready Transfer Foam, 4x5 (in/in) Discharge Instruction: Apply to wound bed if urgo  clean unavailable Maxorb Extra Ag+ Alginate Dressing, 4x4.75 (in/in) Discharge Instruction: Apply to macerated Periwound as instructed UrgoClean AG 4 X 5 (in/in) Discharge Instruction: Apply directly to wound bed. Secondary Dressing ABD Pad, 8x10 Discharge Instruction: Apply over primary dressing as directed. Zetuvit Plus 4x8 in Discharge Instruction: Apply over primary dressing as directed. Secured With Compression Wrap ThreePress (3 layer compression wrap) Discharge Instruction: Apply three layer compression as directed. Compression Stockings Add-Ons [name], [name] ([id number]) [id number] Page 5 of 9 Wound #6 (Lower Leg) Wound Laterality: Right, Anterior Cleanser Peri-Wound Care Sween Lotion (Moisturizing lotion) Discharge Instruction: Apply moisturizing lotion as directed Topical Gentamicin Discharge Instruction: As directed by physician Mupirocin Ointment Discharge Instruction: Apply Mupirocin (Bactroban) as instructed Primary Dressing Maxorb Extra Ag+ Alginate Dressing, 4x4.75 (in/in) Discharge Instruction: Apply to macerated periwound as instructed UrgoClean AG 4 X 5 (in/in) Discharge Instruction: Apply directly to wound bed. Secondary Dressing Woven Gauze Sponge, Non-Sterile 4x4 in Discharge Instruction: Apply over primary dressing as directed. Secured With Compression Wrap ThreePress (3 layer compression wrap) Discharge Instruction: Apply three layer compression as directed. Compression Stockings Add-Ons Electronic Signature(s) Signed: 02/21/2023 8:32:58 AM By: Duanne Guess MD FACS Entered By: Duanne Guess on 02/21/2023 08:32:57 -------------------------------------------------------------------------------- Multi-Disciplinary Care Plan Details Patient Name: Date of Service: GIANLUCAS, EVENSON [name] 02/11/2023 10:30 A M Medical Record Number: [id number] Patient Account Number: [id number] Date of Birth/Sex: Treating RN: [birth year] (72 y.o. [name] Primary Care Chaun Uemura: [name] Other Clinician: Referring Brei Pociask: Treating Younis Mathey/Extender: [name] in Treatment: 35 Multidisciplinary Care Plan reviewed with physician Active Inactive Venous Leg Ulcer Nursing Diagnoses: Knowledge deficit related to disease process and management Potential for venous Insuffiency (use before diagnosis confirmed) Goals: Patient will maintain optimal edema control Date Initiated: 02/17/2022 Target Resolution Date: 02/23/2023 Goal Status: Active Interventions: Assess peripheral edema status every visit. EBER, FERRUFINO ([id number]) [id number] Page 6 of 9 Compression as ordered Provide education on venous insufficiency Treatment Activities: Non-invasive vascular studies : 02/17/2022 Therapeutic compression applied : 02/17/2022 Notes: Wound/Skin Impairment Nursing Diagnoses: Impaired tissue integrity Knowledge deficit related to ulceration/compromised skin integrity Goals: Patient/caregiver will verbalize understanding of skin care regimen Date Initiated: 02/17/2022 Target Resolution Date: 02/23/2023 Goal Status: Active Ulcer/skin breakdown will have a volume reduction of 30% by week 4 Date Initiated: 02/17/2022 Date Inactivated: 03/10/2022 Target Resolution Date: 03/10/2022 Unmet Reason: infection being treated, Goal Status: Unmet waiting on derm appte Ulcer/skin breakdown will have a volume reduction of 50% by week 8 Date Initiated: 03/10/2022 Date Inactivated: 04/14/2022 Target Resolution Date: 04/07/2022 Goal Status: Unmet Unmet Reason: calcinosis Ulcer/skin breakdown will have a volume reduction of 80% by week 12 Date Initiated: 04/14/2022 Date Inactivated: 05/05/2022 Target Resolution Date: 05/05/2022 Unmet Reason: infection, chronic Goal Status: Unmet condition Interventions: Assess patient/caregiver ability to obtain necessary supplies Assess  patient/caregiver ability to perform ulcer/skin care regimen upon admission and as needed Assess ulceration(s) every visit Provide education on ulcer and skin care Treatment Activities: Skin care regimen initiated : 02/17/2022 Topical wound management initiated : 02/17/2022 Notes: Electronic Signature(s) Signed: 02/14/2023 4:45:25 PM By: [name] Entered By: [name] on 02/14/2023 08:47:09 -------------------------------------------------------------------------------- Pain Assessment Details Patient Name: Date of Service:  [name], [name] 02/11/2023 10:30 A M Medical Record Number: [id number] Patient Account Number: [id number] Date of Birth/Sex: Treating RN: Nov 26, 1950 (72 y.o. [name] Primary Care Kariana Wiles: [name] Other Clinician: Referring Channelle Bottger: Treating Verdie Wilms/Extender: [name] in Treatment: 52 Active Problems Location of Pain Severity and Description of Pain Patient Has Paino No Site Locations Rate the pain. KEEDAN, SAMPLE ([id number]) [id number] Page 7 of 9 Rate the pain. Current Pain Level: 0 Pain Management and Medication Current Pain Management: Electronic Signature(s) Signed: 02/14/2023 4:45:25 PM By: [name] Entered By: [name] on 02/14/2023 08:39:59 -------------------------------------------------------------------------------- Patient/Caregiver Education Details Patient Name: Date of Service: [name], [name] 7/19/2024andnbsp10:30 A M Medical Record Number: [id number] Patient Account Number: [id number] Date of Birth/Gender: Treating RN: 1951/06/28 (72 y.o. [name] Primary Care Physician: [name] Other Clinician: Referring Physician: Treating Physician/Extender: [name] in Treatment: 25 Education Assessment Education Provided To: Patient Education Topics Provided Wound/Skin  Impairment: Methods: Explain/Verbal Responses: Reinforcements needed, State content correctly Electronic Signature(s) Signed: 02/14/2023 4:45:25 PM By: [name] Entered By: [name] on 02/14/2023 08:47:21 -------------------------------------------------------------------------------- Wound Assessment Details Patient Name: Date of Service: [name], [name] 02/11/2023 10:30 A M Medical Record Number: [id number] Patient Account Number: [id number] Date of Birth/Sex: Treating RN: 07-02-1951 (72 y.o. [name] Primary Care Rosemary Mossbarger: [name] Other Clinician: Referring Anntonette Madewell: Treating Delayla Hoffmaster/Extender: Roni, Scow, [name] ([id number]) [id number] Page 8 of 9 Weeks in Treatment: 52 Wound Status Wound Number: 3 Primary Lymphedema Etiology: Wound Location: Right, Medial Lower Leg Wound Open Wounding Event: Gradually Appeared Status: Date Acquired: 12/07/2021 Comorbid Cataracts, Anemia, Lymphedema, Congestive Heart Failure, Deep Weeks Of Treatment: 52 History: Vein Thrombosis, Hypertension, Peripheral Venous Disease Clustered Wound: No Wound Measurements Length: (cm) 9.5 Width: (cm) 7 Depth: (cm) 0.3 Area: (cm) 52.229 Volume: (cm) 15.669 % Reduction in Area: -877.9% % Reduction in Volume: -633.6% Epithelialization: Small (1-33%) Tunneling: No Undermining: No Wound Description Classification: Full Thickness With Exposed Support Structures Wound Margin: Distinct, outline attached Exudate Amount: Large Exudate Type: Serosanguineous Exudate Color: red, brown Foul Odor After Cleansing: No Slough/Fibrino Yes Wound Bed Granulation Amount: Small (1-33%) Exposed Structure Granulation Quality: Red Fascia Exposed: No Necrotic Amount: Large (67-100%) Fat Layer (Subcutaneous Tissue) Exposed: Yes Necrotic Quality: Adherent Slough Tendon Exposed: No Muscle Exposed: No Joint Exposed: No Bone  Exposed: No Periwound Skin Texture Texture Color No Abnormalities Noted: No No Abnormalities Noted: No Excoriation: Yes Rubor: Yes Moisture Temperature / Pain No Abnormalities Noted: No Temperature: No Abnormality Maceration: Yes Electronic Signature(s) Signed: 02/14/2023 4:45:25 PM By: [name] Entered By: [name] on 02/14/2023 08:41:58 -------------------------------------------------------------------------------- Wound Assessment Details Patient Name: Date of Service: [name], [name] 02/11/2023 10:30 A M Medical Record Number: [id number] Patient Account Number: [id number] Date of Birth/Sex: Treating RN: [birth year] (72 y.o. [name] Primary Care [place] Stell: [name] Other Clinician: Referring Sandee Bernath: Treating Schneider Warchol/Extender: [name] in Treatment: 52 Wound Status Wound Number: 6 Primary Calcinosis Etiology: Wound Location: Right, Anterior Lower Leg Wound Open Wounding Event: Gradually Appeared Status: Date Acquired: 05/12/2022 Comorbid Cataracts, Anemia, Lymphedema, Congestive Heart Failure, Deep Weeks Of Treatment: 39 History: Vein Thrombosis, Hypertension, Peripheral Venous Disease Clustered Wound: Yes Wound Measurements Length: (cm) 2 [name], [name] ([id number]) Width: (cm) 1 Depth: (cm) 0.2 Area: (cm) 1.571 Volume: (cm) 0.314 % Reduction in Area: -565.7% [id number] Page 9 of 9 % Reduction in Volume: -1208.3% Epithelialization: Small (1-33%) Tunneling: No Undermining: No Wound Description Classification: Full Thickness Without Exposed  Support Structures Wound Margin: Distinct, outline attached Exudate Amount: Medium Exudate Type: Serosanguineous Exudate Color: red, brown Foul Odor After Cleansing: No Slough/Fibrino Yes Wound Bed Granulation Amount: Small (1-33%) Exposed Structure Granulation Quality: Red Fascia Exposed: No Necrotic Amount: Large  (67-100%) Fat Layer (Subcutaneous Tissue) Exposed: Yes Necrotic Quality: Adherent Slough Tendon Exposed: No Muscle Exposed: No Joint Exposed: No Bone Exposed: No Periwound Skin Texture Texture Color No Abnormalities Noted: No No Abnormalities Noted: No Excoriation: Yes Rubor: Yes Moisture Temperature / Pain No Abnormalities Noted: No Temperature: No Abnormality Dry / Scaly: No Maceration: Yes Electronic Signature(s) Signed: 02/14/2023 4:45:25 PM By: [name] Entered By: [name] on 02/14/2023 08:42:34 -------------------------------------------------------------------------------- Vitals Details Patient Name: Date of Service: [name] 02/11/2023 10:30 A M Medical Record Number: [id number] Patient Account Number: [id number] Date of Birth/Sex: Treating RN: 1950/11/05 (72 y.o. [name] Primary Care Tavon Corriher: [name] Other Clinician: Referring Mozell Haber: Treating Omauri Boeve/Extender: [name] in Treatment: 52 Vital Signs Time Taken: 10:45 Temperature (F): 97.9 Height (in): 71 Pulse (bpm): 61 Weight (lbs): 267 Respiratory Rate (breaths/min): 18 Body Mass Index (BMI): 37.2 Blood Pressure (mmHg): 141/71 Reference Range: 80 - 120 mg / dl Electronic Signature(s) Signed: 02/14/2023 4:45:25 PM By: [name] Entered By: [name] on 02/14/2023 08:39:51

## 2023-02-14 NOTE — Progress Notes (Addendum)
[name], [name] ([id number]) [id number] Page 1 of 16 Visit Report for 02/11/2023 Chief Complaint Document Details Patient Name: Date of Service: [name] 02/11/2023 10:30 A M Medical Record Number: [id number] Patient Account Number: [id number] Date of Birth/Sex: Treating RN: 03/31/1951 (72 y.o. M) Primary Care Provider: [name] Other Clinician: Referring Provider: Treating Provider/Extender: [name] in Treatment: 52 Information Obtained from: Patient Chief Complaint Patient seen for complaints of Non-Healing Wounds. Electronic Signature(s) Signed: 02/21/2023 8:33:04 AM By: [name] Guess MD FACS Entered By: [name] on 02/21/2023 08:33:04 -------------------------------------------------------------------------------- Debridement Details Patient Name: Date of Service: [name] 02/11/2023 10:30 A M Medical Record Number: [id number] Patient Account Number: [id number] Date of Birth/Sex: Treating RN: 08-14-1950 (72 y.o. [name] Primary Care Provider: [name] Other Clinician: Referring Provider: Treating Provider/Extender: [name] in Treatment: 52 Debridement Performed for Assessment: Wound #3 Right,Medial Lower Leg Performed By: Physician [name] Guess, MD Debridement Type: Debridement Level of Consciousness (Pre-procedure): Awake and Alert Pre-procedure Verification/Time Out Yes - 10:50 Taken: Start Time: 10:50 Pain Control: Lidocaine 4% T opical Solution Percent of Wound Bed Debrided: 60% T Area Debrided (cm): otal 31.32 Tissue and other material debrided: Non-Viable, Slough, Slough Level: Non-Viable Tissue Debridement Description: Selective/Open Wound Instrument: Curette Bleeding: Minimum Hemostasis Achieved: Pressure Response to Treatment: Procedure was tolerated well Level of Consciousness (Post- Awake and Alert procedure): Post  Debridement Measurements of Total Wound Length: (cm) 9.5 Width: (cm) 7 Depth: (cm) 0.3 Volume: (cm) 15.669 Character of Wound/Ulcer Post Debridement: Improved Post Procedure Diagnosis Same as [name] ([id number]) [id number] Page 2 of 16 Notes Scribed for Dr. Lady [name] by [name] Bruin, RN Electronic Signature(s) Signed: 02/14/2023 11:13:49 AM By: [name] Guess MD FACS Signed: 02/14/2023 4:45:25 PM By: Gelene Mink By: [name] on 02/14/2023 08:44:27 -------------------------------------------------------------------------------- Debridement Details Patient Name: Date of Service: [name] 02/11/2023 10:30 A M Medical Record Number: [id number] Patient Account Number: [id number] Date of Birth/Sex: Treating RN: Apr 17, 1951 (72 y.o. [name] Primary Care Provider: [name] Other Clinician: Referring Provider: Treating Provider/Extender: [name] in Treatment: 52 Debridement Performed for Assessment: Wound #6 Right,Anterior Lower Leg Performed By: Physician [name] Guess, MD Debridement Type: Debridement Level of Consciousness (Pre-procedure): Awake and Alert Pre-procedure Verification/Time Out Yes - 10:50 Taken: Start Time: 10:50 Pain Control: Lidocaine 4% T opical Solution Percent of Wound Bed Debrided: 50% T Area Debrided (cm): otal 0.78 Tissue and other material debrided: Non-Viable, Slough, Slough Level: Non-Viable Tissue Debridement Description: Selective/Open Wound Instrument: Curette Bleeding: Minimum Hemostasis Achieved: Pressure Response to Treatment: Procedure was tolerated well Level of Consciousness (Post- Awake and Alert procedure): Post Debridement Measurements of Total Wound Length: (cm) 2 Width: (cm) 1 Depth: (cm) 0.2 Volume: (cm) 0.314 Character of Wound/Ulcer Post Debridement: Improved Post Procedure Diagnosis Same as  Pre-procedure Notes Scribed for Dr. Lady [name] by [name] Bruin, RN Electronic Signature(s) Signed: 02/14/2023 11:13:49 AM By: [name] Guess MD FACS Signed: 02/14/2023 4:45:25 PM By: [name] Entered By: [name] on 02/14/2023 08:45:16 -------------------------------------------------------------------------------- HPI Details Patient Name: Date of Service: [name] 02/11/2023 10:30 A M Medical Record Number: [id number] Patient Account Number: [id number] [name] ([id number]) [id number] Page 3 of 16 Date of Birth/Sex: Treating RN: Sep 03, 1950 (72 y.o. M) Primary Care Provider: Other Clinician: [name] Referring Provider: Treating Provider/Extender: [name] in Treatment: 19 History of Present Illness HPI Description: ADMISSION 02/09/2022 This is a 72 year old man who recently moved to [place] from  [place]. He has a history of of coronary artery disease and prior left popliteal vein DVT . He is not diabetic and does not smoke. 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 He has had multiple spinal surgeries and has limited mobility. He primarily uses a motorized wheelchair. He has had lymphedema for many years and does have lymphedema pumps although he says he does not use them frequently. He has wounds on his bilateral lower extremities. He was receiving care in [place] for these prior to his move. They were apparently packing his wounds with Betadine soaked gauze. He has worn compression wraps in the past but not recently. He did say that they helped tremendously. In clinic today, his right ABI is noncompressible but has good signals; the left ABI was 1.17. No formal vascular studies have been performed. On his left dorsal foot, there is a circular lesion that exposes the fat layer. There is fairly heavy slough accumulation within the wound, but no significant odor. On his  right medial calf, he has multiple pinholes that have slough buildup in them and probe for about 2 to 4 mm in depth. They are draining clear fluid. On his right lateral midfoot, he has ulceration consistent with venous stasis. It is geographic and has slough accumulation. 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 02/17/2022: All of the wounds are little bit larger today and have more slough accumulation. Today, the medial calf openings are larger and probe to something that feels calcified. There is odor coming from his wounds. His vascular studies are scheduled for August 9 and 10. 02-24-2022 upon evaluation today patient presents for follow-up concerning his ongoing issues here with his lower extremities. With that being said he does have significant problems with what appears to be cellulitis unfortunately the PCR culture that was sent last week got crushed by UPS in transit. With that being said we are going to reobtain a culture today although I am actually to do it on the right medial leg as this seems to be the most inflamed area today where there is some questionable drainage as well. I am going to remove some of the calcium deposits which are loosening obtain a deeper culture from this area. Fortunately I do not see any evidence of active infection systemically which is good news but locally I think we are having a bigger issue here. He does have his appointment next Thursday with vascular. Patient has also been referred to [facility] for further evaluation and treatment of the calcinosis for possible sulfate injections. 03/04/2022: The culture that was performed last week grew out multiple species including Klebsiella, Morganella, and Pseudomonas aeruginosa. He had been prescribed Bactrim but this was not adequate to cover all of the species and levofloxacin was recommended. He completed the Bactrim but  did not pick up the levofloxacin and begin taking it until yesterday. 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 He had arterial studies performed today. He does have evidence of peripheral arterial disease. Results copied here: +-------+-----------+-----------+------------+------------+ ABI/TBIT oday's ABIT oday's TBIPrevious ABIPrevious TBI +-------+-----------+-----------+------------+------------+ Right [place] 0.75    +-------+-----------+-----------+------------+------------+ Left [place] 0.57    +-------+-----------+-----------+------------+------------+ Arterial wall calcification precludes accurate ankle pressures and ABIs. Summary: Right: Resting right ankle-brachial index indicates noncompressible right lower extremity  arteries. The right toe-brachial index is normal. Left: Resting left ankle-brachial index indicates noncompressible left lower extremity arteries. The left toe-brachial index is abnormal. He continues to have further tissue breakdown at the right medial calf and there is ample slough accumulation along with necrotic subcutaneous tissue. The left dorsal foot wound has built up slough, as well. The right medial foot wound is nearly closed with just a light layer of eschar over the surface. The right dorsal lateral foot wound has also accumulated a fair amount of slough and remains quite tender. 03/10/2022: He has been taking the prescribed levofloxacin and has about 3 days left of this. The erythema and induration on his right leg has improved. He continues to experience further breakdown of the right medial calf wound. There is extensive slough and debris accumulation there. There is heavy slough on his left dorsal foot wound, but no odor or purulent drainage. The right medial foot wound appears to  be closed. The right dorsal lateral foot wound also has a fair amount of slough but it is less tender than at our last visit. He still does not have an appointment with dermatology. 03/22/2022: The right anterior tibial wound has closed. The right lateral foot wound is nearly closed with just a little bit of slough. The left dorsal foot wound is smaller and continues to have some slough buildup but less so than at prior visits. There is good granulation tissue underlying the slough. Unfortunately the right medial calf wound continues to deteriorate. It has a foul odor today and a lot of necrotic tissue, the surrounding skin is also erythematous and moist. 03/30/2022: The right lateral foot wound continues to contract and just has a little bit of slough and eschar present. The left dorsal foot wound is also smaller with slough overlying good granulation tissue. The right medial calf wound actually looks quite a bit better today; there is no odor and there is much less necrotic tissue although some remains present. We have been using topical gentamicin and he has been taking oral Augmentin. 04/07/2022: The patient saw dermatology at [facility] last week. Unfortunately, it appears that they did not read any of the documentation that was provided. 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 Overall, it was a highly unsatisfactory consultation. In the meantime, however, we were able to identify compounding pharmacy who could create a topical sodium thiosulfate ointment. The patient has that with him today. Both of the right foot wounds are now closed. The left dorsal foot wound has contracted but still has a layer of slough on the surface. The  medial calf wounds on the right all look a little bit better. They still have heavy slough along with the chunks of calcium. Everything is stained purple. 04/14/2022: The wound on his dorsal left foot is a little bit smaller again today. There is still slough accumulation on the surface. The medial calf wounds have quite a bit less slough accumulation today. His right leg, however, is warm and erythematous, concerning for cellulitis. 04/14/2022: He completed his course of Augmentin yesterday. The medial calf wound sites are much cleaner today and shallower. There is less fragmented calcium in the wounds. He has a lot of lymphedema fluid-like drainage from these wounds, but  no purulent discharge or malodor. The wound on his dorsal left [name], [name] ([id number]) [id number] Page 4 of 16 foot has also contracted and is shallower. There is a layer of slough over healthy granulation tissue. 05/05/2022: The left dorsal foot wound is smaller today with less slough and more granulation tissue. The right medial calf wounds are shallower, but have extensive slough and some fat necrosis. The drainage has diminished considerably, however. 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 05/12/2022: The left dorsal foot wound continues to contract and fill with granulation tissue. There is slough on the wound surface. The right medial calf wounds also continue to fill with healthy tissue, but there is remaining slough and fat necrosis present. He had a significant increase in his drainage this week and it was a bright yellow-green color. He also had a new wound open over his right anterior tibial surface associated with the spike of cutaneous calcium. The leg is more red, as well, concerning for infection. 05/19/2022: His culture returned positive for Pseudomonas. He is currently  taking a course of oral levofloxacin. We have also been using topical gentamicin mixed in with his sodium thiosulfate. All of the wounds look better this week. 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 There is slough and nonviable subcutaneous tissue still present. The new anterior tibial wound just has a light layer of slough. The dorsal foot wound also has some slough present and appears a little dry today. 05/26/2022: The right medial leg wound continues to improve. It is feeling with granulation tissue, but still accumulates a fairly thick layer of slough on the surface. The more recent anterior tibial wound has remained quite small, but also has some slough on the surface. The left dorsal foot wound has contracted somewhat and has perimeter epithelialization. He completed his oral levofloxacin. 06/02/2022: The left dorsal foot wound continues to improve significantly. There is just a little slough on the wound surface. The right medial leg wound had black drainage, but it looks like silver alginate was applied last week and I theorized that silver sulfide was created with a combination of the silver alginate and the sodium thiosulfate. It did, however, have a bit of an odor to it and extensive slough accumulation. The small anterior tibial wound also had a bit of slough and nonviable subcutaneous tissue present. The skin of his leg was rather macerated, as well. 06/09/2022: The left dorsal foot wound is smaller again this week with just a light layer of slough on the surface. The right medial leg wound looks substantially better. The leg and periwound skin are no longer red and macerated. There is good granulation tissue forming with less slough and nonviable tissue. The anterior tibial wound just has a small amount of slough accumulation. 06/16/2022: The left dorsal foot wound continues to contract. His wrap slipped a little bit, however, and his foot is more  swollen. The right medial leg wound continues to improve. The underlying tissue is more vital-appearing and there is less slough. He does have substantial drainage. The small anterior tibial wound has a bit of slough accumulation. 06/23/2022: The left dorsal foot wound is about half the size as it was last week. There is just some light slough on the surface and some eschar buildup around the edges. The right anterior leg wound is small with a little slough on  the surface. Unfortunately, the right medial leg wound deteriorated fairly substantially. It is malodorous with gray/green/black discoloration. 06/30/2022: The left dorsal foot wound continues to contract. It is nearly closed with just a light layer of slough present. The right anterior leg wound is about the same size and has a small amount of slough on the surface. The right medial leg wound looks a lot better than it did last week. The odor has abated and the tissue is less pulpy and necrotic-looking. There is still a fairly thick layer of slough present. 12/13; the left dorsal foot wound looks very healthy. There was no need to change the dressing here and no debridement. He has a small area on the right anterior lower leg apparently had not changed much in size. The area on the medial leg is the most problematic area this is large with some depth and a completely nonviable surface today. 07/14/2022: The left dorsal foot wound is smaller today with just a little slough and eschar present. The right anterior lower leg wound is about the same without any significant change. The medial leg wound has a black and green surface and is malodorous. No purulent drainage and the periwound skin is intact. Edema control is suboptimal. 07/21/2022: The dorsal foot wound continues to contract and is nearly closed with just a little bit of slough and eschar on the surface. The right anterior lower leg wound is shallower today but otherwise the dimensions  are unchanged. The medial leg wound looks significantly better although it still has a nonviable surface; the odor and black/green surface has resolved. His culture came back with multiple species sensitive to Augmentin, which was prescribed and a very low level of Pseudomonas, which should be handled by the topical gentamicin we have been using. 07/28/2022: The dorsal foot wound is down to just a very tiny opening with a little eschar on the surface. The right anterior tibial wound is about the same with some slough buildup. The right medial leg wound looks quite a bit better this week. There is thick rubbery slough on the surface, but the odor and drainage has improved significantly. 08/04/2022: The dorsal foot wound is almost healed, but the periwound skin has broken down. This appears to be secondary to moisture and adhesive from the absorbent pad. The right anterior tibial wound is shallower. The right medial leg wound continues to improve. It is smaller, still with rubbery slough on the surface. No malodor or purulent drainage. 08/11/2022: The dorsal foot wound is about the same size. It is a little bit dry with some slough accumulation. The right anterior tibial wound is also unchanged. The right medial leg wound is filling in further with good granulation tissue. Still with some slough buildup on the surface. 08/18/2022: His left leg is bright red, hot, and swollen with cellulitis. The dorsal foot wound actually looks quite good and is superficial with just a little bit of slough accumulation. The right anterior tibial wound is unchanged. The right medial leg wound has filled in with good granulation tissue but has copious amounts of drainage. 08/25/2022: His cellulitis has responded nicely to the oral antibiotics. His dorsal foot wound is smaller but has a fair amount of slough buildup. The right anterior tibial wound remains stable and unchanged. The right medial leg wound has more granulation  tissue under a layer of slough, but continues to have copious drainage. The drainage is actually causing skin irritation and threatens to result in breakdown. 09/01/2022: He continues  to have profuse drainage from his wounds which is resulting in tissue maceration on both the dorsum of his left foot and the periwound distal to his medial leg wound. The dorsal foot wound is nearly healed, despite this. The medial right leg wound is filling in with good granulation tissue. We are working on getting home health assistance to change his dressings more frequently to try and control the drainage better. 09/08/2022: We have had success in controlling his drainage. The tissue maceration has improved significantly and his swelling has come down quite markedly. The medial right leg wound is much more superficial and has substantially less nonviable tissue present. The anterior tibial wound is a little bit larger, however with some nonviable tissue present. 09/15/2022: The dorsal foot wound is nearly closed. The anterior tibial wound measures slightly larger today. The medial right leg wound continues to improve quite dramatically with less nonviable tissue and more robust-looking granulation. Edema control is significantly better. 09/22/2022: The left dorsal foot wound remains open, albeit quite tiny. The anterior tibial wound is a little bit larger again today. The medial right leg wound continues to improve with an increase in robust and healthy granulation tissue and less accumulation of nonviable tissue. 09/29/2022: His home health providers wrapped his left leg extremely tightly and he ended up having to cut the wrap off of his foot. This resulted in his foot being very swollen and that the dorsal foot wound is a little bit larger today. The anterior tibial wound is about the same size with a little slough on the surface. The medial right leg wound continues to fill in. There are very few areas with any  significant depth. There is still slough accumulation. 3/13; Patient has a left dorsal foot wound along with a right anterior tibial wound and right medial wound. We are using silver alginate to the left dorsal foot [name], [name] ([id number]) [id number] Page 5 of 16 wound, Santyl and sodium thiosulfate compound ointment to the right lower extremity wounds under 4 layer compression. Patient has no issues or complaints today. 10/13/2022: The left dorsal foot wound is a little bit bigger today, but his foot is also a little bit more swollen. The right medial leg wound has filled in more and has substantially less depth. There is still some nonviable tissue present. The small anterior lower leg wound looks about the same. 10/20/2022: The left dorsal foot wound is epithelializing. There does not appear to be much open at this site. There is some eschar around the edges. The right medial leg wound continues to fill in with granulation tissue. The small anterior lower leg wound is a little bit bigger and there is a chunk of calcium protruding. He continues to have fairly copious drainage from the right leg. 10/27/2022: His home health agency failed to show up at all this past week and as a result, the excessive drainage from his right leg wound has resulted in tissue breakdown. His leg is red and irritated. The wounds themselves look about the same. The wound on his left dorsal foot is nearly closed underneath some eschar. 11/03/2022: We continue to have difficulty with his home health agency. He continues to have significant drainage from his right leg wound which has resulted in [name] further tissue breakdown. He also was not taking his Lasix as prescribed and I think much of the drainage has been his lymphedema fluid choosing the path of least resistance. The wound on his left dorsal foot is closed, but  in the process of cleaning the site, dry skin was pulled off and he has a new  small superficial wound just adjacent to where the dorsal foot wound was. 11/12/2022: His left dorsal foot is completely healed. The right medial leg continues to deteriorate. He is not taking his Lasix as prescribed, nor is he using his lymphedema pumps at all. As a result, he has massive amounts of drainage saturating his dressings and causing further tissue breakdown on his medial leg. Today, his dressings had a foul odor, as well. 11/19/2022: His right medial leg looks [name] worse today. There is more erythema and moisture related tissue breakdown. The 2 anterior wounds look a little bit better. The culture that I took last week did produce Pseudomonas and he is currently taking levofloxacin for this. 11/26/2022: There has been significant improvement to his right medial leg. It is far less raw and many of the superficial open areas have epithelialized. He continues to cut the foot portion of his wrap and as result his foot is bulbous and swollen compared to the rest of his leg, where edema control is good. The smaller of the 2 anterior tibial wound is closed, while the larger is contracting somewhat with a bit of slough at the base. 12/03/2022: His leg continues to demonstrate improvement. There has been epithelialization of much of the denuded area. He has a new small anterior tibial wound that has opened around a nidus of calcium. Edema control is improved. 12/10/2022: Continued improvement in the periwound skin. The anterior tibial wounds are about the same, but the medial leg wound has improved further. There is less nonviable tissue present. 12/17/2022: Between his visit on Tuesday with the nurse and today, he has had significantly more drainage which has resulted in some periwound tissue maceration. I suspect this correlates somewhat with his diuretic use, as he says he takes his diuretic religiously on Saturday, Sunday, and Monday and then does not take it after Tuesday due to being out and about  and not being close to easy restroom access. I suspect the excess fluid is just simply draining out of his leg. 12/21/2022: This was supposed to be a nurse visit, but his leg has broken down so significantly, that we have converted to a wound care visit. He did take his diuretics over the weekend, but despite this, he has had copious drainage and all of his tissues have broken down substantially from the right medial leg ulcer all the way down to his ankle and [name] wrapping posterior to this. There is blue-green staining on his dressing, suggesting ongoing presence of Pseudomonas aeruginosa. 12/24/2022: The wound already looks better than it did 2 days ago. He has been taking ciprofloxacin and the Urgo clean dressing we applied did a much better job of wicking the drainage away from his leg. There is slough on the entire surface of the medial leg wound, but the erythema and induration has improved in the past couple of days. 12/31/2022: Continued improvement of the wound. He says that the pain has basically abated since being on ciprofloxacin. Still with fairly heavy drainage and slough accumulation. 01/07/2023: Overall continued improvement. Still with heavy slough buildup, but less drainage. 01/14/2023: There is a little bit of improvement. The skin is still looking a bit excoriated. He has a few more days left of ciprofloxacin. 01/21/2023: There has been significant improvement. The periwound skin is in much better condition. The ulcers are shallower and actually have some good granulation tissue present  under some slough. 01/28/2023: The wound continues to improve. All of the open areas measured smaller today. There is slough accumulation on the surfaces. His drainage has decreased markedly. 02/04/2023: Continued improvement in the wound. Drainage remains significantly diminished. Slough accumulation is present but to a lesser extent. Periwound skin continues to improve. Edema control is good. 02/11/23:  Had lots of doctor visits this week, didn't take diuretic increased drainage from wound. Electronic Signature(s) Signed: 02/21/2023 8:33:22 AM By: [name] Guess MD FACS Previous Signature: 02/18/2023 4:29:54 PM Version By: Thayer Dallas Entered By: [name] on 02/21/2023 08:33:21 -------------------------------------------------------------------------------- Physical Exam Details Patient Name: Date of Service: KABE, MCKOY [name] 02/11/2023 10:30 A M Medical Record Number: [id number] Patient Account Number: [id number] Date of Birth/Sex: Treating RN: May 18, 1951 (73 y.o. M) Primary Care Provider: [name] Other Clinician: Jackson [name] ([id number]) [id number] Page 6 of 16 Referring Provider: Treating Provider/Extender: [name] in Treatment: 52 Notes 02/11/23: Moisture-related skin excoriation with extensive breakdown slough on all surfaces. Electronic Signature(s) Signed: 02/21/2023 8:33:34 AM By: [name] Guess MD FACS Previous Signature: 02/18/2023 4:29:54 PM Version By: Thayer Dallas Entered By: [name] on 02/21/2023 08:33:33 -------------------------------------------------------------------------------- Physician Orders Details Patient Name: Date of Service: [name] 02/11/2023 10:30 A M Medical Record Number: [id number] Patient Account Number: [id number] Date of Birth/Sex: Treating RN: July 26, 1951 (72 y.o. 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 - Dr. Lady [name] RM 1 Return A Nurse Visit: - Tuesday RM 1 Anesthetic (In clinic) Topical Lidocaine 4% applied to wound bed Bathing/ Shower/ Hygiene May shower with protection but do not get wound dressing(s) wet. Protect dressing(s) with water repellant cover (for example, large plastic bag) or a cast cover and may then take shower. Edema Control - Lymphedema / SCD / Other Lymphedema Pumps. Use Lymphedema pumps on leg(s) 2-3 times a day for 45-60 minutes. If wearing any wraps or hose, do not remove them. Continue exercising as instructed. Elevate legs to the level of the heart or above for 30 minutes daily and/or when sitting for 3-4 times a day throughout the day. - throughout the day Avoid standing for long periods of time. Compression stocking or Garment 20-30 mm/Hg pressure to: - juxtalite left leg daily Other Edema Control Orders/Instructions: - kerlix/coban wrap to left leg while wife recovers from surgery Wound Treatment Wound #3 - Lower Leg Wound Laterality: Right, Medial Cleanser: Soap and Water 2 x Per Week/30 Days Discharge Instructions: May shower and wash wound with dial antibacterial soap and water prior to dressing change. Cleanser: Vashe 5.8 (oz) 2 x Per Week/30 Days Discharge Instructions: Cleanse the wound with Vashe prior to applying a clean dressing using gauze sponges, not tissue or cotton balls. Peri-Wound Care: Ketoconazole Cream 2% 2 x Per Week/30 Days Discharge Instructions: Apply Ketoconazole mixed with zinc to irritated periwound skin Peri-Wound Care: Zinc Oxide Ointment 30g tube 2 x Per Week/30 Days Discharge Instructions: Apply Zinc Oxide to periwound with each dressing change JAIREN, GOLDFARB ([id number]) [id number] Page 7 of  16  Peri-Wound Care: Sween Lotion (Moisturizing lotion) 2 x Per Week/30 Days Discharge Instructions: Apply moisturizing lotion as directed Topical: Gentamicin 2 x Per Week/30 Days Discharge Instructions: mixed 50:50 with mupirocin to wound bed Topical: Mupirocin Ointment 2 x Per Week/30 Days Discharge Instructions: Apply Mupirocin (Bactroban) as instructed Prim Dressing: Hydrofera Blue Ready Transfer Foam, 4x5 (in/in) 2 x Per Week/30 Days ary Discharge Instructions: Apply to wound bed if urgo clean unavailable Prim Dressing: Maxorb Extra Ag+ Alginate Dressing, 4x4.75 (in/in) 2 x Per Week/30 Days ary Discharge Instructions: Apply to macerated Periwound as instructed Prim Dressing: UrgoClean AG 4 X 5 (in/in) 2 x Per Week/30 Days ary Discharge Instructions: Apply directly to wound bed. Secondary Dressing: ABD Pad, 8x10 2 x Per Week/30 Days Discharge Instructions: Apply over primary dressing as directed. Secondary Dressing: Zetuvit Plus 4x8 in 2 x Per Week/30 Days Discharge Instructions: Apply over primary dressing as directed. Compression Wrap: ThreePress (3 layer compression wrap) 2 x Per Week/30 Days Discharge Instructions: Apply three layer compression as directed. Wound #6 - Lower Leg Wound Laterality: Right, Anterior Peri-Wound Care: Sween Lotion (Moisturizing lotion) 2 x Per Week/30 Days Discharge Instructions: Apply moisturizing lotion as directed Topical: Gentamicin 2 x Per Week/30 Days Discharge Instructions: As directed by physician Topical: Mupirocin Ointment 2 x Per Week/30 Days Discharge Instructions: Apply Mupirocin (Bactroban) as instructed Prim Dressing: Maxorb Extra Ag+ Alginate Dressing, 4x4.75 (in/in) 2 x Per Week/30 Days ary Discharge Instructions: Apply to macerated periwound as instructed Prim Dressing: UrgoClean AG 4 X 5 (in/in) 2 x Per Week/30 Days ary Discharge Instructions: Apply directly to wound bed. Secondary Dressing: Woven Gauze Sponge, Non-Sterile 4x4 in  2 x Per Week/30 Days Discharge Instructions: Apply over primary dressing as directed. Compression Wrap: ThreePress (3 layer compression wrap) 2 x Per Week/30 Days Discharge Instructions: Apply three layer compression as directed. Patient Medications llergies: No Known Allergies A Notifications Medication Indication Start End 02/11/2023 lidocaine DOSE topical 4 % cream - cream topical Electronic Signature(s) Signed: 02/21/2023 10:26:58 AM By: [name] Guess MD FACS Previous Signature: 02/14/2023 11:13:49 AM Version By: [name] Guess MD FACS Previous Signature: 02/14/2023 4:45:25 PM Version By: [name] Entered By: [name] on 02/21/2023 08:33:47 Problem List Details -------------------------------------------------------------------------------- [name] ([id number]) [id number] Page 8 of 16 Patient Name: Date of Service: [name], [name] 02/11/2023 10:30 A M Medical Record Number: [id number] Patient Account Number: [id number] Date of Birth/Sex: Treating RN: 1951-02-12 (72 y.o. 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) 02/09/2022 No Yes I89.0 Lymphedema, not elsewhere classified 02/09/2022 No Yes I10 Essential (primary) hypertension 02/09/2022 No Yes I25.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 angina pectoris 02/09/2022 No Yes Z86.718 Personal history of other venous thrombosis and embolism 02/09/2022 No Yes Inactive Problems ICD-10 Code Description Active Date Inactive Date L97.512 Non-pressure chronic ulcer of other  part of right foot with fat layer exposed 02/17/2022 02/17/2022 Resolved Problems ICD-10 Code Description Active Date Resolved Date L97.522 Non-pressure chronic ulcer of other part of left foot with fat layer exposed 02/09/2022 02/09/2022 Electronic Signature(s) Signed: 02/21/2023 8:32:52 AM By: [name] Guess MD FACS Previous Signature: 02/14/2023 12:09:41 PM Version By: [name] Guess MD FACS Entered By: [name] on 02/21/2023 08:32:52 --------------------------------------------------------------------------------  Progress Note Details Patient Name: Date of Service: [name], [name] 02/11/2023 10:30 A [name], [name] ([id number]) [id number] Page 9 of 16 Medical Record Number: [id number] Patient Account Number: [id number] Date of Birth/Sex: Treating RN: 05-24-1951 (72 y.o. M) Primary Care Provider: [name] Other Clinician: Referring Provider: Treating Provider/Extender: [name] in Treatment: 41 Subjective Chief Complaint Information obtained from Patient Patient seen for complaints of Non-Healing Wounds. History of Present Illness (HPI) ADMISSION 02/09/2022 This is a 72 year old man who recently moved to [place] from [place]. He has a history of of coronary artery disease and prior left popliteal vein DVT . He is not diabetic and does not smoke. 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 He has had multiple spinal surgeries and has limited mobility. He primarily uses a motorized wheelchair. He has had lymphedema for many years and does have lymphedema pumps although he says he does not use them frequently. He has wounds on his bilateral lower extremities. He was receiving care in [place] for these prior to his move. They were apparently packing his wounds with Betadine soaked gauze. He has worn compression wraps in the past but not recently. He did say that they helped tremendously. In  clinic today, his right ABI is noncompressible but has good signals; the left ABI was 1.17. No formal vascular studies have been performed. On his left dorsal foot, there is a circular lesion that exposes the fat layer. There is fairly heavy slough accumulation within the wound, but no significant odor. On his right medial calf, he has multiple pinholes that have slough buildup in them and probe for about 2 to 4 mm in depth. They are draining clear fluid. On his right lateral midfoot, he has ulceration consistent with venous stasis. It is geographic and has slough accumulation. 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 02/17/2022: All of the wounds are little bit larger today and have more slough accumulation. Today, the medial calf openings are larger and probe to something that feels calcified. There is odor coming from his wounds. His vascular studies are scheduled for August 9 and 10. 02-24-2022 upon evaluation today patient presents for follow-up concerning his ongoing issues here with his lower extremities. With that being said he does have significant problems with what appears to be cellulitis unfortunately the PCR culture that was sent last week got crushed by UPS in transit. With that being said we are going to reobtain a culture today although I am actually to do it on the right medial leg as this seems to be the most inflamed area today where there is some questionable drainage as well. I am going to remove some of the calcium deposits which are loosening obtain a deeper culture from this area. Fortunately I do not see any evidence of active infection systemically which is good news but locally I think we are having a bigger issue here. He does have his appointment next Thursday with vascular. Patient has also been referred to [facility] for further evaluation and treatment of the calcinosis for  possible sulfate injections. 03/04/2022: The culture that was performed last week grew out multiple species including Klebsiella, Morganella, and Pseudomonas aeruginosa. He had been prescribed Bactrim but this was not adequate to cover all of the species and levofloxacin was recommended. He completed the Bactrim but did not pick up the levofloxacin and begin taking it until yesterday.  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 He had arterial studies performed today. He does have evidence of peripheral arterial disease. Results copied here: +-------+-----------+-----------+------------+------------+ ABI/TBIT oday's ABIT oday's TBIPrevious ABIPrevious TBI +-------+-----------+-----------+------------+------------+ Right [place] 0.75    +-------+-----------+-----------+------------+------------+ Left  0.57    +-------+-----------+-----------+------------+------------+ Arterial wall calcification precludes accurate ankle pressures and ABIs. Summary: Right: Resting right ankle-brachial index indicates noncompressible right lower extremity arteries. The right toe-brachial index is normal. Left: Resting left ankle-brachial index indicates noncompressible left lower extremity arteries. The left toe-brachial index is abnormal. He continues to have further tissue breakdown at the right medial calf and there is ample slough accumulation along with necrotic subcutaneous tissue. The left dorsal foot wound has built up slough, as well. The right medial foot wound is nearly closed with just a light layer of eschar over the surface. The right dorsal lateral foot wound has also accumulated a fair amount of slough and remains quite tender. 03/10/2022: He has been taking the prescribed levofloxacin and has about 3 days left of this.  The erythema and induration on his right leg has improved. He continues to experience further breakdown of the right medial calf wound. There is extensive slough and debris accumulation there. There is heavy slough on his left dorsal foot wound, but no odor or purulent drainage. The right medial foot wound appears to be closed. The right dorsal lateral foot wound also has a fair amount of slough but it is less tender than at our last visit. He still does not have an appointment with dermatology. 03/22/2022: The right anterior tibial wound has closed. The right lateral foot wound is nearly closed with just a little bit of slough. The left dorsal foot wound is smaller and continues to have some slough buildup but less so than at prior visits. There is good granulation tissue underlying the slough. Unfortunately the right medial calf wound continues to deteriorate. It has a foul odor today and a lot of necrotic tissue, the surrounding skin is also erythematous and moist. 03/30/2022: The right lateral foot wound continues to contract and just has a little bit of slough and eschar present. The left dorsal foot wound is also smaller with slough overlying good granulation tissue. The right medial calf wound actually looks quite a bit better today; there is no odor and there is much less necrotic tissue although some remains present. We have been using topical gentamicin and he has been taking oral Augmentin. 04/07/2022: The patient saw dermatology at [facility] last week. Unfortunately, it appears that they did not read any of the documentation that was provided. 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 Overall, it was a highly unsatisfactory  consultation. In the meantime, however, we were able to identify compounding pharmacy who could create a topical sodium thiosulfate ointment. The patient has that with him today. Both of the right foot wounds are now closed. The left dorsal foot wound has contracted but still has a layer of slough on the surface. The medial calf wounds on the right all look a little bit better. They still have heavy slough along with the  chunks of calcium. Everything is stained purple. [name], [name] ([id number]) [id number] Page 10 of 16 04/14/2022: The wound on his dorsal left foot is a little bit smaller again today. There is still slough accumulation on the surface. The medial calf wounds have quite a bit less slough accumulation today. His right leg, however, is warm and erythematous, concerning for cellulitis. 04/14/2022: He completed his course of Augmentin yesterday. The medial calf wound sites are much cleaner today and shallower. There is less fragmented calcium in the wounds. He has a lot of lymphedema fluid-like drainage from these wounds, but no purulent discharge or malodor. The wound on his dorsal left foot has also contracted and is shallower. There is a layer of slough over healthy granulation tissue. 05/05/2022: The left dorsal foot wound is smaller today with less slough and more granulation tissue. The right medial calf wounds are shallower, but have extensive slough and some fat necrosis. The drainage has diminished considerably, however. 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 05/12/2022: The left dorsal foot wound continues to contract and fill with granulation tissue. There is slough on the wound surface. The right medial calf wounds also continue to fill with healthy tissue, but there is remaining slough and fat necrosis present. He had a significant  increase in his drainage this week and it was a bright yellow-green color. He also had a new wound open over his right anterior tibial surface associated with the spike of cutaneous calcium. The leg is more red, as well, concerning for infection. 05/19/2022: His culture returned positive for Pseudomonas. He is currently taking a course of oral levofloxacin. We have also been using topical gentamicin mixed in with his sodium thiosulfate. All of the wounds look better this week. 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 There is slough and nonviable subcutaneous tissue still present. The new anterior tibial wound just has a light layer of slough. The dorsal foot wound also has some slough present and appears a little dry today. 05/26/2022: The right medial leg wound continues to improve. It is feeling with granulation tissue, but still accumulates a fairly thick layer of slough on the surface. The more recent anterior tibial wound has remained quite small, but also has some slough on the surface. The left dorsal foot wound has contracted somewhat and has perimeter epithelialization. He completed his oral levofloxacin. 06/02/2022: The left dorsal foot wound continues to improve significantly. There is just a little slough on the wound surface. The right medial leg wound had black drainage, but it looks like silver alginate was applied last week and I theorized that silver sulfide was created with a combination of the silver alginate and the sodium thiosulfate. It did, however, have a bit of an odor to it and extensive slough accumulation. The small anterior tibial wound also had a bit of slough and nonviable subcutaneous tissue present. The skin of his leg was rather macerated, as well. 06/09/2022: The left dorsal foot wound is smaller again this week with just a light layer of slough on the surface. The right medial leg wound looks substantially better. The leg and  periwound skin are no longer red and macerated. There is good granulation tissue forming with less slough and nonviable tissue. The anterior tibial wound just has a small amount of slough accumulation. 06/16/2022: The left dorsal foot wound continues to contract.  His wrap slipped a little bit, however, and his foot is more swollen. The right medial leg wound continues to improve. The underlying tissue is more vital-appearing and there is less slough. He does have substantial drainage. The small anterior tibial wound has a bit of slough accumulation. 06/23/2022: The left dorsal foot wound is about half the size as it was last week. There is just some light slough on the surface and some eschar buildup around the edges. The right anterior leg wound is small with a little slough on the surface. Unfortunately, the right medial leg wound deteriorated fairly substantially. It is malodorous with gray/green/black discoloration. 06/30/2022: The left dorsal foot wound continues to contract. It is nearly closed with just a light layer of slough present. The right anterior leg wound is about the same size and has a small amount of slough on the surface. The right medial leg wound looks a lot better than it did last week. The odor has abated and the tissue is less pulpy and necrotic-looking. There is still a fairly thick layer of slough present. 12/13; the left dorsal foot wound looks very healthy. There was no need to change the dressing here and no debridement. He has a small area on the right anterior lower leg apparently had not changed much in size. The area on the medial leg is the most problematic area this is large with some depth and a completely nonviable surface today. 07/14/2022: The left dorsal foot wound is smaller today with just a little slough and eschar present. The right anterior lower leg wound is about the same without any significant change. The medial leg wound has a black and green surface  and is malodorous. No purulent drainage and the periwound skin is intact. Edema control is suboptimal. 07/21/2022: The dorsal foot wound continues to contract and is nearly closed with just a little bit of slough and eschar on the surface. The right anterior lower leg wound is shallower today but otherwise the dimensions are unchanged. The medial leg wound looks significantly better although it still has a nonviable surface; the odor and black/green surface has resolved. His culture came back with multiple species sensitive to Augmentin, which was prescribed and a very low level of Pseudomonas, which should be handled by the topical gentamicin we have been using. 07/28/2022: The dorsal foot wound is down to just a very tiny opening with a little eschar on the surface. The right anterior tibial wound is about the same with some slough buildup. The right medial leg wound looks quite a bit better this week. There is thick rubbery slough on the surface, but the odor and drainage has improved significantly. 08/04/2022: The dorsal foot wound is almost healed, but the periwound skin has broken down. This appears to be secondary to moisture and adhesive from the absorbent pad. The right anterior tibial wound is shallower. The right medial leg wound continues to improve. It is smaller, still with rubbery slough on the surface. No malodor or purulent drainage. 08/11/2022: The dorsal foot wound is about the same size. It is a little bit dry with some slough accumulation. The right anterior tibial wound is also unchanged. The right medial leg wound is filling in further with good granulation tissue. Still with some slough buildup on the surface. 08/18/2022: His left leg is bright red, hot, and swollen with cellulitis. The dorsal foot wound actually looks quite good and is superficial with just a little bit of slough accumulation. The right anterior  tibial wound is unchanged. The right medial leg wound has filled in  with good granulation tissue but has copious amounts of drainage. 08/25/2022: His cellulitis has responded nicely to the oral antibiotics. His dorsal foot wound is smaller but has a fair amount of slough buildup. The right anterior tibial wound remains stable and unchanged. The right medial leg wound has more granulation tissue under a layer of slough, but continues to have copious drainage. The drainage is actually causing skin irritation and threatens to result in breakdown. 09/01/2022: He continues to have profuse drainage from his wounds which is resulting in tissue maceration on both the dorsum of his left foot and the periwound distal to his medial leg wound. The dorsal foot wound is nearly healed, despite this. The medial right leg wound is filling in with good granulation tissue. We are working on getting home health assistance to change his dressings more frequently to try and control the drainage better. 09/08/2022: We have had success in controlling his drainage. The tissue maceration has improved significantly and his swelling has come down quite markedly. The medial right leg wound is much more superficial and has substantially less nonviable tissue present. The anterior tibial wound is a little bit larger, however with some nonviable tissue present. 09/15/2022: The dorsal foot wound is nearly closed. The anterior tibial wound measures slightly larger today. The medial right leg wound continues to improve quite dramatically with less nonviable tissue and more robust-looking granulation. Edema control is significantly better. 09/22/2022: The left dorsal foot wound remains open, albeit quite tiny. The anterior tibial wound is a little bit larger again today. The medial right leg wound continues to improve with an increase in robust and healthy granulation tissue and less accumulation of nonviable tissue. [name], [name] ([id number]) [id number] Page 11 of 16 09/29/2022: His  home health providers wrapped his left leg extremely tightly and he ended up having to cut the wrap off of his foot. This resulted in his foot being very swollen and that the dorsal foot wound is a little bit larger today. The anterior tibial wound is about the same size with a little slough on the surface. The medial right leg wound continues to fill in. There are very few areas with any significant depth. There is still slough accumulation. 3/13; Patient has a left dorsal foot wound along with a right anterior tibial wound and right medial wound. We are using silver alginate to the left dorsal foot wound, Santyl and sodium thiosulfate compound ointment to the right lower extremity wounds under 4 layer compression. Patient has no issues or complaints today. 10/13/2022: The left dorsal foot wound is a little bit bigger today, but his foot is also a little bit more swollen. The right medial leg wound has filled in more and has substantially less depth. There is still some nonviable tissue present. The small anterior lower leg wound looks about the same. 10/20/2022: The left dorsal foot wound is epithelializing. There does not appear to be much open at this site. There is some eschar around the edges. The right medial leg wound continues to fill in with granulation tissue. The small anterior lower leg wound is a little bit bigger and there is a chunk of calcium protruding. He continues to have fairly copious drainage from the right leg. 10/27/2022: His home health agency failed to show up at all this past week and as a result, the excessive drainage from his right leg wound has resulted in tissue breakdown. His  leg is red and irritated. The wounds themselves look about the same. The wound on his left dorsal foot is nearly closed underneath some eschar. 11/03/2022: We continue to have difficulty with his home health agency. He continues to have significant drainage from his right leg wound which has  resulted in [name] further tissue breakdown. He also was not taking his Lasix as prescribed and I think much of the drainage has been his lymphedema fluid choosing the path of least resistance. The wound on his left dorsal foot is closed, but in the process of cleaning the site, dry skin was pulled off and he has a new small superficial wound just adjacent to where the dorsal foot wound was. 11/12/2022: His left dorsal foot is completely healed. The right medial leg continues to deteriorate. He is not taking his Lasix as prescribed, nor is he using his lymphedema pumps at all. As a result, he has massive amounts of drainage saturating his dressings and causing further tissue breakdown on his medial leg. Today, his dressings had a foul odor, as well. 11/19/2022: His right medial leg looks [name] worse today. There is more erythema and moisture related tissue breakdown. The 2 anterior wounds look a little bit better. The culture that I took last week did produce Pseudomonas and he is currently taking levofloxacin for this. 11/26/2022: There has been significant improvement to his right medial leg. It is far less raw and many of the superficial open areas have epithelialized. He continues to cut the foot portion of his wrap and as result his foot is bulbous and swollen compared to the rest of his leg, where edema control is good. The smaller of the 2 anterior tibial wound is closed, while the larger is contracting somewhat with a bit of slough at the base. 12/03/2022: His leg continues to demonstrate improvement. There has been epithelialization of much of the denuded area. He has a new small anterior tibial wound that has opened around a nidus of calcium. Edema control is improved. 12/10/2022: Continued improvement in the periwound skin. The anterior tibial wounds are about the same, but the medial leg wound has improved further. There is less nonviable tissue present. 12/17/2022: Between his visit on Tuesday  with the nurse and today, he has had significantly more drainage which has resulted in some periwound tissue maceration. I suspect this correlates somewhat with his diuretic use, as he says he takes his diuretic religiously on Saturday, Sunday, and Monday and then does not take it after Tuesday due to being out and about and not being close to easy restroom access. I suspect the excess fluid is just simply draining out of his leg. 12/21/2022: This was supposed to be a nurse visit, but his leg has broken down so significantly, that we have converted to a wound care visit. He did take his diuretics over the weekend, but despite this, he has had copious drainage and all of his tissues have broken down substantially from the right medial leg ulcer all the way down to his ankle and [name] wrapping posterior to this. There is blue-green staining on his dressing, suggesting ongoing presence of Pseudomonas aeruginosa. 12/24/2022: The wound already looks better than it did 2 days ago. He has been taking ciprofloxacin and the Urgo clean dressing we applied did a much better job of wicking the drainage away from his leg. There is slough on the entire surface of the medial leg wound, but the erythema and induration has improved in the past  couple of days. 12/31/2022: Continued improvement of the wound. He says that the pain has basically abated since being on ciprofloxacin. Still with fairly heavy drainage and slough accumulation. 01/07/2023: Overall continued improvement. Still with heavy slough buildup, but less drainage. 01/14/2023: There is a little bit of improvement. The skin is still looking a bit excoriated. He has a few more days left of ciprofloxacin. 01/21/2023: There has been significant improvement. The periwound skin is in much better condition. The ulcers are shallower and actually have some good granulation tissue present under some slough. 01/28/2023: The wound continues to improve. All of the open areas  measured smaller today. There is slough accumulation on the surfaces. His drainage has decreased markedly. 02/04/2023: Continued improvement in the wound. Drainage remains significantly diminished. Slough accumulation is present but to a lesser extent. Periwound skin continues to improve. Edema control is good. 02/11/23: Had lots of doctor visits this week, didn't take diuretic increased drainage from wound. Patient History Information obtained from Patient, Chart. Family History Cancer - Mother, Heart Disease - Father,Mother, Hypertension - Father,Siblings, Lung Disease - Siblings, No family history of Diabetes, Hereditary Spherocytosis, Kidney Disease, Seizures, Stroke, Thyroid Problems, Tuberculosis. Social History Former smoker - quit 1991, Marital Status - Married, Alcohol Use - Moderate, Drug Use - No History, Caffeine Use - Daily - coffee. Medical History Eyes Patient has history of Cataracts - right eye removed Denies history of Glaucoma, Optic Neuritis Ear/Nose/Mouth/Throat Denies history of Chronic sinus problems/congestion, Middle ear problems Hematologic/Lymphatic [name], [name] ([id number]) [id number] Page 12 of 16 Patient has history of Anemia - macrocytic, Lymphedema Cardiovascular Patient has history of Congestive Heart Failure, Deep Vein Thrombosis - left leg, Hypertension, Peripheral Venous Disease Endocrine Denies history of Type I Diabetes, Type II Diabetes Genitourinary Denies history of End Stage Renal Disease Integumentary (Skin) Denies history of History of Burn Oncologic Denies history of Received Chemotherapy, Received Radiation Psychiatric Denies history of Anorexia/bulimia, Confinement Anxiety Hospitalization/Surgery History - cervical fusion. - lumbar surgery. - coronary stent placement. - lasik eye surgery. - hydrocele excision. Medical A Surgical History Notes nd Constitutional Symptoms (General  Health) obesity Ear/Nose/Mouth/Throat hard of hearing Respiratory frozen diaphragm right Cardiovascular hyperlipidemia Musculoskeletal myositis, lumbar DDD, spinal stenosis, cervical facet joint syndrome Objective Constitutional Vitals Time Taken: 10:45 AM, Height: 71 in, Weight: 267 lbs, BMI: 37.2, Temperature: 97.9 F, Pulse: 61 bpm, Respiratory Rate: 18 breaths/min, Blood Pressure: 141/71 mmHg. Integumentary (Hair, Skin) Wound #3 status is Open. Original cause of wound was Gradually Appeared. The date acquired was: 12/07/2021. The wound has been in treatment 52 weeks. The wound is located on the Right,Medial Lower Leg. The wound measures 9.5cm length x 7cm width x 0.3cm depth; 52.229cm^2 area and 15.669cm^3 volume. There is Fat Layer (Subcutaneous Tissue) exposed. There is no tunneling or undermining noted. There is a large amount of serosanguineous drainage noted. The wound margin is distinct with the outline attached to the wound base. There is small (1-33%) red granulation within the wound bed. There is a large (67-100%) amount of necrotic tissue within the wound bed including Adherent Slough. The periwound skin appearance exhibited: Excoriation, Maceration, Rubor. Periwound temperature was noted as No Abnormality. Wound #6 status is Open. Original cause of wound was Gradually Appeared. The date acquired was: 05/12/2022. The wound has been in treatment 39 weeks. The wound is located on the Right,Anterior Lower Leg. The wound measures 2cm length x 1cm width x 0.2cm depth; 1.571cm^2 area and 0.314cm^3 volume. There is Fat Layer (Subcutaneous Tissue) exposed. There  is no tunneling or undermining noted. There is a medium amount of serosanguineous drainage noted. The wound margin is distinct with the outline attached to the wound base. There is small (1-33%) red granulation within the wound bed. There is a large (67-100%) amount of necrotic tissue within the wound bed including Adherent  Slough. The periwound skin appearance exhibited: Excoriation, Maceration, Rubor. The periwound skin appearance did not exhibit: Dry/Scaly. Periwound temperature was noted as No Abnormality. 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 - Dr. Lady [name] RM 1 Nurse Visit: - Tuesday RM 1 Anesthetic: (In clinic) Topical Lidocaine 4% applied to wound bed [name], [name] ([id number]) [id number] Page 13 of 16 Bathing/ Shower/ Hygiene: May shower with protection but do not get wound dressing(s) wet. Protect dressing(s) with water repellant cover (for example, large plastic bag) or a cast cover and may then take shower. Edema Control - Lymphedema / SCD / Other: Lymphedema Pumps. Use Lymphedema pumps on leg(s) 2-3 times a day for 45-60 minutes. If wearing any wraps or hose, do not remove them. Continue exercising as instructed. Elevate legs to the level of the heart or above for 30 minutes daily and/or when sitting for 3-4 times a day throughout the day. - throughout the day Avoid standing for long periods of time. Compression stocking or Garment 20-30 mm/Hg pressure to: - juxtalite left leg daily Other Edema Control Orders/Instructions: - kerlix/coban wrap to left leg while wife recovers from surgery The following medication(s) was prescribed: lidocaine topical 4 % cream cream topical was prescribed at facility WOUND #3: - Lower Leg Wound Laterality: Right, Medial Cleanser: Soap and Water 2 x Per Week/30 Days Discharge Instructions: May shower and wash wound with dial antibacterial  soap and water prior to dressing change. Cleanser: Vashe 5.8 (oz) 2 x Per Week/30 Days Discharge Instructions: Cleanse the wound with Vashe prior to applying a clean dressing using gauze sponges, not tissue or cotton balls. Peri-Wound Care: Ketoconazole Cream 2% 2 x Per Week/30 Days Discharge Instructions: Apply Ketoconazole mixed with zinc to irritated periwound skin Peri-Wound Care: Zinc Oxide Ointment 30g tube 2 x Per Week/30 Days Discharge Instructions: Apply Zinc Oxide to periwound with each dressing change Peri-Wound Care: Sween Lotion (Moisturizing lotion) 2 x Per Week/30 Days Discharge Instructions: Apply moisturizing lotion as directed Topical: Gentamicin 2 x Per Week/30 Days Discharge Instructions: mixed 50:50 with mupirocin to wound bed Topical: Mupirocin Ointment 2 x Per Week/30 Days Discharge Instructions: Apply Mupirocin (Bactroban) as instructed Prim Dressing: Hydrofera Blue Ready Transfer Foam, 4x5 (in/in) 2 x Per Week/30 Days ary Discharge Instructions: Apply to wound bed if urgo clean unavailable Prim Dressing: Maxorb Extra Ag+ Alginate Dressing, 4x4.75 (in/in) 2 x Per Week/30 Days ary Discharge Instructions: Apply to macerated Periwound as instructed Prim Dressing: UrgoClean AG 4 X 5 (in/in) 2 x Per Week/30 Days ary Discharge Instructions: Apply directly to wound bed. Secondary Dressing: ABD Pad, 8x10 2 x Per Week/30 Days Discharge Instructions: Apply over primary dressing as directed. Secondary Dressing: Zetuvit Plus 4x8 in 2 x Per Week/30 Days Discharge Instructions: Apply over primary dressing as directed. Com pression Wrap: ThreePress (3 layer compression wrap) 2 x Per Week/30 Days Discharge Instructions: Apply three layer compression as directed.  WOUND #6: - Lower Leg Wound Laterality: Right, Anterior Peri-Wound Care: Sween Lotion (Moisturizing lotion) 2 x Per Week/30 Days Discharge Instructions: Apply moisturizing lotion as directed Topical: Gentamicin 2 x Per  Week/30 Days Discharge Instructions: As directed by physician Topical: Mupirocin Ointment 2 x Per Week/30 Days Discharge Instructions: Apply Mupirocin (Bactroban) as instructed Prim Dressing: Maxorb Extra Ag+ Alginate Dressing, 4x4.75 (in/in) 2 x Per Week/30 Days ary Discharge Instructions: Apply to macerated periwound as instructed Prim Dressing: UrgoClean AG 4 X 5 (in/in) 2 x Per Week/30 Days ary Discharge Instructions: Apply directly to wound bed. Secondary Dressing: Woven Gauze Sponge, Non-Sterile 4x4 in 2 x Per Week/30 Days Discharge Instructions: Apply over primary dressing as directed. Com pression Wrap: ThreePress (3 layer compression wrap) 2 x Per Week/30 Days Discharge Instructions: Apply three layer compression as directed. Increased drainage likely due to decreased diuretic use, wounds stable 1. Debrided slough from all open wound surfaces. 2. Continue topical gentamicin to wound surfaces with urgo clean. 3. Periwound zinc/keto and TCA for itching. 4. Urgo/ four layer wrap. 5. RN visit early in week; follow up with me one week. Electronic Signature(s) Signed: 02/21/2023 8:34:09 AM By: [name] Guess MD FACS Previous Signature: 02/18/2023 4:29:54 PM Version By: Thayer Dallas Entered By: [name] on 02/21/2023 08:34:09 -------------------------------------------------------------------------------- HxROS Details Patient Name: Date of Service: [name] 02/11/2023 10:30 A M Medical Record Number: [id number] Patient Account Number: [id number] Date of Birth/Sex: Treating RN: 02-04-1951 (72 y.o. M) Primary Care Provider: [name] Other Clinician: Referring Provider: Treating Provider/Extender: [name] in Treatment: [address], [place] ([id number]) [id number] Page 14 of 16 Information Obtained From Patient Chart Constitutional Symptoms (General Health) Medical History: Past Medical History  Notes: obesity Eyes Medical History: Positive for: Cataracts - right eye removed Negative for: Glaucoma; Optic Neuritis Ear/Nose/Mouth/Throat Medical History: Negative for: Chronic sinus problems/congestion; Middle ear problems Past Medical History Notes: hard of hearing Hematologic/Lymphatic Medical History: Positive for: Anemia - macrocytic; Lymphedema Respiratory Medical History: Past Medical History Notes: frozen diaphragm right Cardiovascular Medical History: Positive for: Congestive Heart Failure; Deep Vein Thrombosis - left leg; Hypertension; Peripheral Venous Disease Past Medical History Notes: hyperlipidemia Endocrine Medical History: Negative for: Type I Diabetes; Type II Diabetes Genitourinary Medical History: Negative for: End Stage Renal Disease Integumentary (Skin) Medical History: Negative for: History of Burn Musculoskeletal Medical History: Past Medical History Notes: myositis, lumbar DDD, spinal stenosis, cervical facet joint syndrome Oncologic Medical History: Negative for: Received Chemotherapy; Received Radiation Psychiatric Medical History: Negative for: Anorexia/bulimia; Confinement Anxiety HBO Extended History Items Eyes: Cataracts Immunizations Pneumococcal VaccineCRYSTIAN, [name] ([id number]) [id number] Page 15 of 16 Received Pneumococcal Vaccination: Yes Received Pneumococcal Vaccination On or After 60th Birthday: Yes Implantable Devices No devices added Hospitalization / Surgery History Type of Hospitalization/Surgery cervical fusion lumbar surgery coronary stent placement lasik eye surgery hydrocele excision Family and Social History Cancer: Yes - Mother; Diabetes: No; Heart Disease: Yes - Father,Mother; Hereditary Spherocytosis: No; Hypertension: Yes - Father,Siblings; Kidney Disease: No; Lung Disease: Yes - Siblings; Seizures: No; Stroke: No; Thyroid Problems: No; Tuberculosis: No; Former smoker -  quit 1991; Marital Status - Married; Alcohol Use: Moderate; Drug Use: No History; Caffeine Use: Daily - coffee; Financial Concerns: No; Food, Clothing or Shelter Needs: No; Support System Lacking: No; Transportation Concerns: No Electronic Signature(s) Signed: 02/21/2023 10:26:58 AM By: [name] Guess MD FACS Entered By: [name] on 02/21/2023 08:33:27 -------------------------------------------------------------------------------- SuperBill Details Patient Name: Date of Service: JACQUISE, RARICK 02/11/2023 Medical Record Number: [id number] Patient  Account Number: [id number] Date of Birth/Sex: Treating RN: 1951/04/21 (72 y.o. [name] Primary Care Provider: [name] Other Clinician: Referring Provider: Treating Provider/Extender: [name] in Treatment: 52 Diagnosis Coding ICD-10 Codes Code Description [phone] 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) I89.0 Lymphedema, not elsewhere classified I10 Essential (primary) hypertension I25.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 angina pectoris Z86.718 Personal history of other venous thrombosis and embolism Facility Procedures : CPT4 Code: 84132440 Description: 97597 - DEBRIDE WOUND 1ST 20 SQ CM OR < ICD-10 Diagnosis Description 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ed Modifier: Quantity: 1 : CPT4 Code: 10272536 Description: 97598 - DEBRIDE WOUND EA ADDL 20 SQ CM ICD-10 Diagnosis Description 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ed Modifier: Quantity: 1 : CPT4 Code: 64403474 Description: (Facility Use Only) 29581LT - APPLY MULTLAY COMPRS LWR LT LEG ICD-10 Diagnosis Description I89.0 Lymphedema, not elsewhere classified Modifier: Quantity:  1 Physician Procedures : CPT4 Code Description Modifier TREVANTE, TENNELL ([id number]) [id number] 99214 - WC PHYS LEVEL 4 - EST PT 25 1 ICD-10 Diagnosis Description L97.812 Non-pressure chronic ulcer of other part of right lower leg with fat layer  exposed 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) I89.0 Lymphedema, not elsewhere classified Quantity: f Page 16 of 16 : [id number] 97597 - WC PHYS DEBR WO ANESTH 20 SQ CM 1 ICD-10 Diagnosis Description 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ed Quantity: : [id number] 97598 - WC PHYS DEBR WO ANESTH EA ADD 20 CM 1 ICD-10 Diagnosis Description 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ed Quantity: Electronic Signature(s) Signed: 02/21/2023 8:34:16 AM By: [name] Guess MD FACS Previous Signature: 02/14/2023 12:10:13 PM Version By: [name] Guess MD FACS Previous Signature: 02/14/2023 11:13:49 AM Version By: [name] Guess MD FACS Entered By: [name] on 02/21/2023 08:34:15

## 2023-02-15 ENCOUNTER — Encounter (HOSPITAL_BASED_OUTPATIENT_CLINIC_OR_DEPARTMENT_OTHER): Payer: Medicare Other | Admitting: General Surgery

## 2023-02-15 DIAGNOSIS — I110 Hypertensive heart disease with heart failure: Secondary | ICD-10-CM | POA: Diagnosis not present

## 2023-02-15 DIAGNOSIS — I872 Venous insufficiency (chronic) (peripheral): Secondary | ICD-10-CM | POA: Diagnosis not present

## 2023-02-15 DIAGNOSIS — L942 Calcinosis cutis: Secondary | ICD-10-CM | POA: Diagnosis not present

## 2023-02-15 DIAGNOSIS — L97812 Non-pressure chronic ulcer of other part of right lower leg with fat layer exposed: Secondary | ICD-10-CM | POA: Diagnosis not present

## 2023-02-15 DIAGNOSIS — I890 Lymphedema, not elsewhere classified: Secondary | ICD-10-CM | POA: Diagnosis not present

## 2023-02-15 DIAGNOSIS — I509 Heart failure, unspecified: Secondary | ICD-10-CM | POA: Diagnosis not present

## 2023-02-15 NOTE — Progress Notes (Signed)
HUTSON, LUFT ([id number]) [id number] Page 1 of 1 Visit Report for 02/15/2023 SuperBill Details Patient Name: Date of Service: [name], [name] 02/15/2023 Medical Record Number: [id number] Patient Account Number: [id number] Date of Birth/Sex: Treating RN: 1951/04/17 (72 y.o. 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

## 2023-02-15 NOTE — Progress Notes (Signed)
[name], [name] ([id number]) [id number] Page 1 of 5 Visit Report for 02/15/2023 Arrival Information Details Patient Name: Date of Service: [name], [name] 02/15/2023 11:15 A M Medical Record Number: [id number] Patient Account Number: [id number] Date of Birth/Sex: Treating RN: April 13, 1951 (72 y.o. [name] Primary Care [name]: [name] [name]: Referring [name]: Treating [name]/Extender: [name] in Treatment: 9 Visit Information History Since Last Visit Added or deleted any medications: No Patient Arrived: Wheel Chair Any new allergies or adverse reactions: No Arrival Time: 11:35 Had a fall or experienced change in No Accompanied By: self activities of daily living that may affect Transfer Assistance: None risk of falls: Patient Identification Verified: Yes Signs or symptoms of abuse/neglect since last visito No Secondary Verification Process Completed: Yes Hospitalized since last visit: No Patient Requires Transmission-Based Precautions: No Implantable device outside of the clinic excluding No Patient Has Alerts: Yes cellular tissue based products placed in the center Patient Alerts: R ABI= N/C, TBI= .75 since last visit: L ABI =N/C, TBI = .57 Has Dressing in Place as Prescribed: Yes Has Compression in Place as Prescribed: Yes Pain Present Now: No Electronic Signature(s) Signed: 02/15/2023 5:07:22 PM By: [name] Entered By: [name] on 02/15/2023 11:36:23 -------------------------------------------------------------------------------- Compression Therapy Details Patient Name: Date of Service: [name] 02/15/2023 11:15 A M Medical Record Number: [id number] Patient Account Number: [id number] Date of Birth/Sex: Treating RN: 11-05-1950 (72 y.o. [name] Primary Care [name]: [name] [name]: Referring [name]: Treating [name]/Extender: [name] in Treatment: 34 Compression Therapy Performed for Wound Assessment: Wound #3 Right,Medial Lower Leg Performed By: [name] [name] Deed, RN Compression Type: Three Emergency planning/management officer) Signed: 02/15/2023 5:07:22 PM By: [name] Entered By: [name] on 02/15/2023 11:43:56 -------------------------------------------------------------------------------- Encounter Discharge Information Details Patient Name: Date of Service: [name] 02/15/2023 11:15 A M Medical Record Number: [id number] Patient Account Number: [id number] [name], [name] ([id number]) [id number] Page 2 of 5 Date of Birth/Sex: Treating RN: [birth year] (72 y.o. [name] Primary Care [name]: [name]: [name] Referring Aaryan Essman: Treating [name]/Extender: [name] in Treatment: 33 Encounter Discharge Information Items Discharge Condition: Stable Ambulatory Status: Wheelchair Discharge Destination: Home Transportation: Private Auto Accompanied By: self Schedule Follow-up Appointment: Yes Clinical Summary of Care: Patient Declined Electronic Signature(s) Signed: 02/15/2023 5:07:22 PM By: [name] Entered By: [name] on 02/15/2023 11:44:48 -------------------------------------------------------------------------------- Patient/Caregiver Education Details Patient Name: Date of Service: [name], [name] 7/23/2024andnbsp11:15 A M Medical Record Number: [id number] Patient Account Number: [id number] Date of Birth/Gender: Treating RN: 08-04-1950 (72 y.o. [name] Primary Care Physician: [name] [name]: Referring Physician: Treating Physician/Extender: [name] in Treatment: 24 Education Assessment Education Provided To: Patient Education Topics Provided Infection: Methods: Explain/Verbal Responses:  Reinforcements needed, State content correctly Venous: Methods: Explain/Verbal Responses: Reinforcements needed, State content correctly Electronic Signature(s) Signed: 02/15/2023 5:07:22 PM By: [name] Entered By: [name] on 02/15/2023 11:44:32 -------------------------------------------------------------------------------- Wound Assessment Details Patient Name: Date of Service: [name], [name] 02/15/2023 11:15 A M Medical Record Number: [id number] Patient Account Number: [id number] Date of Birth/Sex: Treating RN: 1951-01-09 (72 y.o. [name] Primary Care [name]: [name] [name]: Referring [name]: Treating [name]/Extender: [name] in Treatment: [address] [name] ([id number]) [id number] Page 3 of 5 Wound Number: 3 Primary Etiology: Lymphedema Wound Location: Right, Medial Lower Leg Wound Status: Open Wounding Event: Gradually  Appeared Date Acquired: 12/07/2021 Weeks Of Treatment: 53 Clustered Wound: No Wound Measurements Length: (cm) 9.5 Width: (cm) 7 Depth: (cm) 0.3 Area: (cm) 52.229 Volume: (cm) 15.669 % Reduction in Area: -877.9% % Reduction in Volume: -633.6% Wound Description Classification: Full Thickness With Exposed Support Exudate Amount: Large Exudate Type: Serosanguineous Exudate Color: red, brown Structures Periwound Skin Texture Texture Color No Abnormalities Noted: No No Abnormalities Noted: No Moisture No Abnormalities Noted: No Treatment Notes Wound #3 (Lower Leg) Wound Laterality: Right, Medial Cleanser Soap and Water Discharge Instruction: May shower and wash wound with dial antibacterial soap and water prior to dressing change. Vashe 5.8 (oz) Discharge Instruction: Cleanse the wound with Vashe prior to applying a clean dressing using gauze sponges, not tissue or cotton balls. Peri-Wound Care Ketoconazole Cream 2% Discharge  Instruction: Apply Ketoconazole mixed with zinc to irritated periwound skin Zinc Oxide Ointment 30g tube Discharge Instruction: Apply Zinc Oxide to periwound with each dressing change Sween Lotion (Moisturizing lotion) Discharge Instruction: Apply moisturizing lotion as directed Topical Gentamicin Discharge Instruction: mixed 50:50 with mupirocin to wound bed Mupirocin Ointment Discharge Instruction: Apply Mupirocin (Bactroban) as instructed Primary Dressing Hydrofera Blue Ready Transfer Foam, 4x5 (in/in) Discharge Instruction: Apply to wound bed if urgo clean unavailable Maxorb Extra Ag+ Alginate Dressing, 4x4.75 (in/in) Discharge Instruction: Apply to macerated Periwound as instructed UrgoClean AG 4 X 5 (in/in) Discharge Instruction: Apply directly to wound bed. Secondary Dressing ABD Pad, 8x10 Discharge Instruction: Apply over primary dressing as directed. Zetuvit Plus 4x8 in Discharge Instruction: Apply over primary dressing as directed. Secured With Compression Wrap ThreePress (3 layer compression wrap) Discharge Instruction: Apply three layer compression as directed. [name], [name] ([id number]) [id number] Page 4 of 5 Compression Stockings Add-Ons Electronic Signature(s) Signed: 02/15/2023 5:07:22 PM By: [name] Entered By: [name] on 02/15/2023 11:36:41 -------------------------------------------------------------------------------- Wound Assessment Details Patient Name: Date of Service: [name], [name] 02/15/2023 11:15 A M Medical Record Number: [id number] Patient Account Number: [id number] Date of Birth/Sex: Treating RN: [birth year] (72 y.o. [name] Primary Care Ronn Smolinsky: [name] [name]: Referring Juma Oxley: Treating Apphia Cropley/Extender: [name] in Treatment: 80 Wound Status Wound Number: 6 Primary Etiology: Calcinosis Wound Location: Right, Anterior Lower Leg Wound  Status: Open Wounding Event: Gradually Appeared Date Acquired: 05/12/2022 Weeks Of Treatment: 39 Clustered Wound: Yes Wound Measurements Length: (cm) 2 Width: (cm) 1 Depth: (cm) 0.2 Area: (cm) 1.571 Volume: (cm) 0.314 % Reduction in Area: -565.7% % Reduction in Volume: -1208.3% Wound Description Classification: Full Thickness Without Exposed Suppor Exudate Amount: Medium Exudate Type: Serosanguineous Exudate Color: red, brown t Structures Periwound Skin Texture Texture Color No Abnormalities Noted: No No Abnormalities Noted: No Moisture No Abnormalities Noted: No Treatment Notes Wound #6 (Lower Leg) Wound Laterality: Right, Anterior Cleanser Peri-Wound Care Sween Lotion (Moisturizing lotion) Discharge Instruction: Apply moisturizing lotion as directed Topical Gentamicin Discharge Instruction: As directed by physician Mupirocin Ointment Discharge Instruction: Apply Mupirocin (Bactroban) as instructed Primary Dressing Maxorb Extra Ag+ Alginate Dressing, 4x4.75 (in/in) Discharge Instruction: Apply to macerated periwound as instructed UrgoClean AG 4 X 5 (in/in) Discharge Instruction: Apply directly to wound bed. [name], [name] ([id number]) [id number] Page 5 of 5 Secondary Dressing Woven Gauze Sponge, Non-Sterile 4x4 in Discharge Instruction: Apply over primary dressing as directed. Secured With Compression Wrap ThreePress (3 layer compression wrap) Discharge Instruction: Apply three layer compression as directed. Compression Stockings Add-Ons Electronic Signature(s) Signed: 02/15/2023 5:07:22 PM By: [name] Entered By: [name] on 02/15/2023 11:36:41

## 2023-02-18 ENCOUNTER — Encounter (HOSPITAL_BASED_OUTPATIENT_CLINIC_OR_DEPARTMENT_OTHER): Payer: Medicare Other | Admitting: General Surgery

## 2023-02-18 DIAGNOSIS — I872 Venous insufficiency (chronic) (peripheral): Secondary | ICD-10-CM | POA: Diagnosis not present

## 2023-02-18 DIAGNOSIS — I890 Lymphedema, not elsewhere classified: Secondary | ICD-10-CM | POA: Diagnosis not present

## 2023-02-18 DIAGNOSIS — L942 Calcinosis cutis: Secondary | ICD-10-CM | POA: Diagnosis not present

## 2023-02-18 DIAGNOSIS — I110 Hypertensive heart disease with heart failure: Secondary | ICD-10-CM | POA: Diagnosis not present

## 2023-02-18 DIAGNOSIS — L97812 Non-pressure chronic ulcer of other part of right lower leg with fat layer exposed: Secondary | ICD-10-CM | POA: Diagnosis not present

## 2023-02-18 DIAGNOSIS — I509 Heart failure, unspecified: Secondary | ICD-10-CM | POA: Diagnosis not present

## 2023-02-18 NOTE — Progress Notes (Signed)
KISHAN, DODDRIDGE ([id number]) [id number] Page 1 of 15 Visit Report for 02/18/2023 Chief Complaint Document Details Patient Name: Date of Service: [name], [name] 02/18/2023 11:00 A M Medical Record Number: [id number] Patient Account Number: [id number] Date of Birth/Sex: Treating RN: 20-Oct-1950 (72 y.o. M) Primary Care Provider: [name] Other Clinician: Referring Provider: Treating Provider/Extender: [name] in Treatment: 107 Information Obtained from: Patient Chief Complaint Patient seen for complaints of Non-Healing Wounds. Electronic Signature(s) Signed: 02/18/2023 11:45:43 AM By: Duanne Guess MD FACS Entered By: Duanne Guess on 02/18/2023 11:45:43 -------------------------------------------------------------------------------- Debridement Details Patient Name: Date of Service: [name] 02/18/2023 11:00 A M Medical Record Number: [id number] Patient Account Number: [id number] Date of Birth/Sex: Treating RN: [birth year] (72 y.o. [name], [name] Primary Care Provider: [name] Other Clinician: Referring Provider: Treating Provider/Extender: [name] in Treatment: 53 Debridement Performed for Assessment: Wound #3 Right,Medial Lower Leg Performed By: Physician Duanne Guess, MD Debridement Type: Debridement Level of Consciousness (Pre-procedure): Awake and Alert Pre-procedure Verification/Time Out Yes - 11:20 Taken: Start Time: 11:21 Pain Control: Lidocaine 4% T opical Solution Percent of Wound Bed Debrided: 40% T Area Debrided (cm): otal 16.39 Tissue and other material debrided: Non-Viable, Slough, Slough Level: Non-Viable Tissue Debridement Description: Selective/Open Wound Instrument: Curette Specimen: Tissue Culture Number of Specimens T aken: 1 Bleeding: Minimum Hemostasis Achieved: Pressure Procedural Pain: 2 Post Procedural Pain: 0 Response to Treatment:  Procedure was tolerated well Level of Consciousness (Post- Awake and Alert procedure): Post Debridement Measurements of Total Wound Length: (cm) 9 Width: (cm) 5.8 Depth: (cm) 0.3 Volume: (cm) 12.299 Character of Wound/Ulcer Post Debridement: Requires Further Debridement [name] ([id number]) [id number] Page 2 of 15 Post Procedure Diagnosis Same as Pre-procedure Notes : Scribed for Dr. [name] by Zenaida Deed, RN Electronic Signature(s) Signed: 02/18/2023 11:50:23 AM By: Duanne Guess MD FACS Signed: 02/18/2023 12:12:40 PM By: Zenaida Deed RN, BSN Entered By: Zenaida Deed on 02/18/2023 11:27:16 -------------------------------------------------------------------------------- HPI Details Patient Name: Date of Service: [name] 02/18/2023 11:00 A M Medical Record Number: [id number] Patient Account Number: [id number] Date of Birth/Sex: Treating RN: 10/14/1950 (72 y.o. M) Primary Care Provider: [name] Other Clinician: Referring Provider: Treating Provider/Extender: [name] in Treatment: 25 History of Present Illness HPI Description: ADMISSION 02/09/2022 This is a 72 year old man who recently moved to [place] from [place]. He has a history of of coronary artery disease and prior left popliteal vein DVT . He is not diabetic and does not smoke. 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 He has had multiple spinal surgeries and has limited mobility. He primarily uses a motorized wheelchair. He has had lymphedema for many years and does have lymphedema pumps although he says he does not use them frequently. He has wounds on his bilateral lower extremities. He was receiving care in [place] for these prior to his move. They were apparently packing his wounds with Betadine soaked gauze. He has worn compression wraps in the past but not recently. He did say that they helped tremendously. In  clinic today, his right ABI is noncompressible but has good signals; the left ABI was 1.17. No formal vascular studies have been performed. On his left dorsal foot, there is a circular lesion that exposes the fat layer. There is fairly heavy slough accumulation within the wound, but no significant odor. On his right medial calf, he has multiple pinholes that have slough buildup in them and probe for about 2 to 4  mm in depth. They are draining clear fluid. On his right lateral midfoot, he has ulceration consistent with venous stasis. It is geographic and has slough accumulation. 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 02/17/2022: All of the wounds are little bit larger today and have more slough accumulation. Today, the medial calf openings are larger and probe to something that feels calcified. There is odor coming from his wounds. His vascular studies are scheduled for August 9 and 10. 02-24-2022 upon evaluation today patient presents for follow-up concerning his ongoing issues here with his lower extremities. With that being said he does have significant problems with what appears to be cellulitis unfortunately the PCR culture that was sent last week got crushed by [employer] in transit. With that being said we are going to reobtain a culture today although I am actually to do it on the right medial leg as this seems to be the most inflamed area today where there is some questionable drainage as well. I am going to remove some of the calcium deposits which are loosening obtain a deeper culture from this area. Fortunately I do not see any evidence of active infection systemically which is good news but locally I think we are having a bigger issue here. He does have his appointment next Thursday with vascular. Patient has also been referred to [facility] for further evaluation and treatment of the calcinosis for  possible sulfate injections. 03/04/2022: The culture that was performed last week grew out multiple species including Klebsiella, Morganella, and Pseudomonas aeruginosa. He had been prescribed Bactrim but this was not adequate to cover all of the species and levofloxacin was recommended. He completed the Bactrim but did not pick up the levofloxacin and begin taking it until yesterday. 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 He had arterial studies performed today. He does have evidence of peripheral arterial disease. Results copied here: +-------+-----------+-----------+------------+------------+ ABI/TBIT oday's ABIT oday's TBIPrevious ABIPrevious TBI +-------+-----------+-----------+------------+------------+ Right Port Reading 0.75    +-------+-----------+-----------+------------+------------+ Left Scio 0.57    +-------+-----------+-----------+------------+------------+ Arterial wall calcification precludes accurate ankle pressures and ABIs. Summary: Right: Resting right ankle-brachial index indicates noncompressible right lower extremity arteries. The right toe-brachial index is normal. Left: Resting left ankle-brachial index indicates noncompressible left lower extremity arteries. The left toe-brachial index is abnormal. [name], [name] ([id number]) [id number] Page 3 of 15 He continues to have further tissue breakdown at the right medial calf and there is ample slough accumulation along with necrotic subcutaneous tissue. The left dorsal foot wound has built up slough, as well. The right medial foot wound is nearly closed with just a light layer of eschar over the surface. The right dorsal lateral foot wound has also accumulated a fair amount of slough and remains quite tender. 03/10/2022: He  has been taking the prescribed levofloxacin and has about 3 days left of this. The erythema and induration on his right leg has improved. He continues to experience further breakdown of the right medial calf wound. There is extensive slough and debris accumulation there. There is heavy slough on his left dorsal foot wound, but no odor or purulent drainage. The right medial foot wound appears to be closed. The right dorsal lateral foot wound also has a fair amount of slough but it is less tender than at our last  visit. He still does not have an appointment with dermatology. 03/22/2022: The right anterior tibial wound has closed. The right lateral foot wound is nearly closed with just a little bit of slough. The left dorsal foot wound is smaller and continues to have some slough buildup but less so than at prior visits. There is good granulation tissue underlying the slough. Unfortunately the right medial calf wound continues to deteriorate. It has a foul odor today and a lot of necrotic tissue, the surrounding skin is also erythematous and moist. 03/30/2022: The right lateral foot wound continues to contract and just has a little bit of slough and eschar present. The left dorsal foot wound is also smaller with slough overlying good granulation tissue. The right medial calf wound actually looks quite a bit better today; there is no odor and there is much less necrotic tissue although some remains present. We have been using topical gentamicin and he has been taking oral Augmentin. 04/07/2022: The patient saw dermatology at [facility] last week. Unfortunately, it appears that they did not read any of the documentation that was provided. 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 Overall, it was a highly unsatisfactory consultation. In the meantime, however, we were able to identify compounding pharmacy who could create a topical sodium thiosulfate ointment. The patient has that with him today. Both of the right foot wounds are now closed. The left dorsal foot wound has contracted but still has a layer of slough on the surface. The medial calf wounds on the right all look a little bit better. They still have heavy slough along with the chunks of calcium. Everything is stained purple. 04/14/2022: The wound on his dorsal left foot is a little bit smaller again today. There is still slough accumulation on the surface. The medial calf wounds have quite a bit less slough accumulation today. His right leg, however, is warm and erythematous, concerning for cellulitis. 04/14/2022: He completed his course of Augmentin yesterday. The medial calf wound sites are much cleaner today and shallower. There is less fragmented calcium in the wounds. He has a lot of lymphedema fluid-like drainage from these wounds, but no purulent discharge or malodor. The wound on his dorsal left foot has also contracted and is shallower. There is a layer of slough over healthy granulation tissue. 05/05/2022: The left dorsal foot wound is smaller today with less slough and more granulation tissue. The right medial calf wounds are shallower, but have extensive slough and some fat necrosis. The drainage has diminished considerably, however. 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 05/12/2022: The left dorsal foot wound continues to contract and fill with granulation tissue. There is slough on the wound surface. The right medial calf wounds also continue to fill with healthy tissue, but there is remaining slough and fat necrosis present. He had a significant  increase in his drainage this week and it was a bright yellow-green color. He also had a new wound open over his right anterior tibial surface associated with the spike of cutaneous calcium. The leg is more red, as well, concerning for infection. 05/19/2022: His culture returned positive for  Pseudomonas. He is currently taking a course of oral levofloxacin. We have also been using topical gentamicin mixed in with his sodium thiosulfate. All of the wounds look better this week. 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 There is slough and nonviable subcutaneous tissue still present. The new anterior tibial wound just has a light layer of slough. The dorsal foot wound also has some slough present and appears a little dry today. 05/26/2022: The right medial leg wound continues to improve. It is feeling with granulation tissue, but still accumulates a fairly thick layer of slough on the surface. The more recent anterior tibial wound has remained quite small, but also has some slough on the surface. The left dorsal foot wound has contracted somewhat and has perimeter epithelialization. He completed his oral levofloxacin. 06/02/2022: The left dorsal foot wound continues to improve significantly. There is just a little slough on the wound surface. The right medial leg wound had black drainage, but it looks like silver alginate was applied last week and I theorized that silver sulfide was created with a combination of the silver alginate and the sodium thiosulfate. It did, however, have a bit of an odor to it and extensive slough accumulation. The small anterior tibial wound also had a bit of slough and nonviable subcutaneous tissue present. The skin of his leg was rather macerated, as well. 06/09/2022: The left dorsal foot wound is smaller again this week with just a light layer of slough on the surface. The right medial leg wound looks substantially better. The leg and  periwound skin are no longer red and macerated. There is good granulation tissue forming with less slough and nonviable tissue. The anterior tibial wound just has a small amount of slough accumulation. 06/16/2022: The left dorsal foot wound continues to contract. His wrap slipped a little bit, however, and his foot is more swollen. The right medial leg wound continues to improve. The underlying tissue is more vital-appearing and there is less slough. He does have substantial drainage. The small anterior tibial wound has a bit of slough accumulation. 06/23/2022: The left dorsal foot wound is about half the size as it was last week. There is just some light slough on the surface and some eschar buildup around the edges. The right anterior leg wound is small with a little slough on the surface. Unfortunately, the right medial leg wound deteriorated fairly substantially. It is malodorous with gray/green/black discoloration. 06/30/2022: The left dorsal foot wound continues to contract. It is nearly closed with just a light layer of slough present. The right anterior leg wound is about the same size and has a small amount of slough on the surface. The right medial leg wound looks a lot better than it did last week. The odor has abated and the tissue is less pulpy and necrotic-looking. There is still a fairly thick layer of slough present. 12/13; the left dorsal foot wound looks very healthy. There was no need to change the dressing here and no debridement. He has a small area on the right anterior lower leg apparently had not changed much in size. The area on the medial leg is the most problematic area this is large with some depth and a completely nonviable surface today. 07/14/2022: The left dorsal foot wound is smaller today with just a little slough and eschar present. The right anterior lower leg wound is about the same without any significant change. The medial leg wound has a black and green  surface  and is malodorous. No purulent drainage and the periwound skin is intact. Edema control is suboptimal. 07/21/2022: The dorsal foot wound continues to contract and is nearly closed with just a little bit of slough and eschar on the surface. The right anterior lower leg wound is shallower today but otherwise the dimensions are unchanged. The medial leg wound looks significantly better although it still has a nonviable surface; the odor and black/green surface has resolved. His culture came back with multiple species sensitive to Augmentin, which was prescribed and a very low level of Pseudomonas, which should be handled by the topical gentamicin we have been using. 07/28/2022: The dorsal foot wound is down to just a very tiny opening with a little eschar on the surface. The right anterior tibial wound is about the same with some slough buildup. The right medial leg wound looks quite a bit better this week. There is thick rubbery slough on the surface, but the odor and drainage has improved significantly. 08/04/2022: The dorsal foot wound is almost healed, but the periwound skin has broken down. This appears to be secondary to moisture and adhesive from the absorbent pad. The right anterior tibial wound is shallower. The right medial leg wound continues to improve. It is smaller, still with rubbery slough on the surface. No malodor or purulent drainage. [name], [name] ([id number]) [id number] Page 4 of 15 08/11/2022: The dorsal foot wound is about the same size. It is a little bit dry with some slough accumulation. The right anterior tibial wound is also unchanged. The right medial leg wound is filling in further with good granulation tissue. Still with some slough buildup on the surface. 08/18/2022: His left leg is bright red, hot, and swollen with cellulitis. The dorsal foot wound actually looks quite good and is superficial with just a little bit of slough accumulation. The right  anterior tibial wound is unchanged. The right medial leg wound has filled in with good granulation tissue but has copious amounts of drainage. 08/25/2022: His cellulitis has responded nicely to the oral antibiotics. His dorsal foot wound is smaller but has a fair amount of slough buildup. The right anterior tibial wound remains stable and unchanged. The right medial leg wound has more granulation tissue under a layer of slough, but continues to have copious drainage. The drainage is actually causing skin irritation and threatens to result in breakdown. 09/01/2022: He continues to have profuse drainage from his wounds which is resulting in tissue maceration on both the dorsum of his left foot and the periwound distal to his medial leg wound. The dorsal foot wound is nearly healed, despite this. The medial right leg wound is filling in with good granulation tissue. We are working on getting home health assistance to change his dressings more frequently to try and control the drainage better. 09/08/2022: We have had success in controlling his drainage. The tissue maceration has improved significantly and his swelling has come down quite markedly. The medial right leg wound is much more superficial and has substantially less nonviable tissue present. The anterior tibial wound is a little bit larger, however with some nonviable tissue present. 09/15/2022: The dorsal foot wound is nearly closed. The anterior tibial wound measures slightly larger today. The medial right leg wound continues to improve quite dramatically with less nonviable tissue and more robust-looking granulation. Edema control is significantly better. 09/22/2022: The left dorsal foot wound remains open, albeit quite tiny. The anterior tibial wound is a little bit larger again today. The  medial right leg wound continues to improve with an increase in robust and healthy granulation tissue and less accumulation of nonviable tissue. 09/29/2022: His  home health providers wrapped his left leg extremely tightly and he ended up having to cut the wrap off of his foot. This resulted in his foot being very swollen and that the dorsal foot wound is a little bit larger today. The anterior tibial wound is about the same size with a little slough on the surface. The medial right leg wound continues to fill in. There are very few areas with any significant depth. There is still slough accumulation. 3/13; Patient has a left dorsal foot wound along with a right anterior tibial wound and right medial wound. We are using silver alginate to the left dorsal foot wound, Santyl and sodium thiosulfate compound ointment to the right lower extremity wounds under 4 layer compression. Patient has no issues or complaints today. 10/13/2022: The left dorsal foot wound is a little bit bigger today, but his foot is also a little bit more swollen. The right medial leg wound has filled in more and has substantially less depth. There is still some nonviable tissue present. The small anterior lower leg wound looks about the same. 10/20/2022: The left dorsal foot wound is epithelializing. There does not appear to be much open at this site. There is some eschar around the edges. The right medial leg wound continues to fill in with granulation tissue. The small anterior lower leg wound is a little bit bigger and there is a chunk of calcium protruding. He continues to have fairly copious drainage from the right leg. 10/27/2022: His home health agency failed to show up at all this past week and as a result, the excessive drainage from his right leg wound has resulted in tissue breakdown. His leg is red and irritated. The wounds themselves look about the same. The wound on his left dorsal foot is nearly closed underneath some eschar. 11/03/2022: We continue to have difficulty with his home health agency. He continues to have significant drainage from his right leg wound which has  resulted in even further tissue breakdown. He also was not taking his Lasix as prescribed and I think much of the drainage has been his lymphedema fluid choosing the path of least resistance. The wound on his left dorsal foot is closed, but in the process of cleaning the site, dry skin was pulled off and he has a new small superficial wound just adjacent to where the dorsal foot wound was. 11/12/2022: His left dorsal foot is completely healed. The right medial leg continues to deteriorate. He is not taking his Lasix as prescribed, nor is he using his lymphedema pumps at all. As a result, he has massive amounts of drainage saturating his dressings and causing further tissue breakdown on his medial leg. Today, his dressings had a foul odor, as well. 11/19/2022: His right medial leg looks even worse today. There is more erythema and moisture related tissue breakdown. The 2 anterior wounds look a little bit better. The culture that I took last week did produce Pseudomonas and he is currently taking levofloxacin for this. 11/26/2022: There has been significant improvement to his right medial leg. It is far less raw and many of the superficial open areas have epithelialized. He continues to cut the foot portion of his wrap and as result his foot is bulbous and swollen compared to the rest of his leg, where edema control is good. The smaller of  the 2 anterior tibial wound is closed, while the larger is contracting somewhat with a bit of slough at the base. 12/03/2022: His leg continues to demonstrate improvement. There has been epithelialization of much of the denuded area. He has a new small anterior tibial wound that has opened around a nidus of calcium. Edema control is improved. 12/10/2022: Continued improvement in the periwound skin. The anterior tibial wounds are about the same, but the medial leg wound has improved further. There is less nonviable tissue present. 12/17/2022: Between his visit on Tuesday  with the nurse and today, he has had significantly more drainage which has resulted in some periwound tissue maceration. I suspect this correlates somewhat with his diuretic use, as he says he takes his diuretic religiously on Saturday, Sunday, and Monday and then does not take it after Tuesday due to being out and about and not being close to easy restroom access. I suspect the excess fluid is just simply draining out of his leg. 12/21/2022: This was supposed to be a nurse visit, but his leg has broken down so significantly, that we have converted to a wound care visit. He did take his diuretics over the weekend, but despite this, he has had copious drainage and all of his tissues have broken down substantially from the right medial leg ulcer all the way down to his ankle and even wrapping posterior to this. There is blue-green staining on his dressing, suggesting ongoing presence of Pseudomonas aeruginosa. 12/24/2022: The wound already looks better than it did 2 days ago. He has been taking ciprofloxacin and the Urgo clean dressing we applied did a much better job of wicking the drainage away from his leg. There is slough on the entire surface of the medial leg wound, but the erythema and induration has improved in the past couple of days. 12/31/2022: Continued improvement of the wound. He says that the pain has basically abated since being on ciprofloxacin. Still with fairly heavy drainage and slough accumulation. 01/07/2023: Overall continued improvement. Still with heavy slough buildup, but less drainage. 01/14/2023: There is a little bit of improvement. The skin is still looking a bit excoriated. He has a few more days left of ciprofloxacin. 01/21/2023: There has been significant improvement. The periwound skin is in much better condition. The ulcers are shallower and actually have some good granulation tissue present under some slough. 01/28/2023: The wound continues to improve. All of the open areas  measured smaller today. There is slough accumulation on the surfaces. His drainage has decreased markedly. [name], [name] ([id number]) [id number] Page 5 of 15 02/04/2023: Continued improvement in the wound. Drainage remains significantly diminished. Slough accumulation is present but to a lesser extent. Periwound skin continues to improve. Edema control is good. 02/18/2023: The heavy drainage persists, although it is was not as significant today as it was at his nurse visit on Tuesday. The drainage is yellow and does not have the blue-green discoloration suggestive of Pseudomonas. The actual wounds on his leg are getting smaller, but the periwound skin is quite excoriated. Electronic Signature(s) Signed: 02/18/2023 11:46:48 AM By: Duanne Guess MD FACS Entered By: Duanne Guess on 02/18/2023 11:46:48 -------------------------------------------------------------------------------- Physical Exam Details Patient Name: Date of Service: [name], [name] 02/18/2023 11:00 A M Medical Record Number: [id number] Patient Account Number: [id number] Date of Birth/Sex: Treating RN: [birth year] (72 y.o. M) Primary Care Provider: [name] Other Clinician: Referring Provider: Treating Provider/Extender: [name] in Treatment: 67 Constitutional Hypertensive, asymptomatic. . . . no acute  distress. Respiratory Normal work of breathing on room air. Notes 02/18/2023: The heavy drainage persists, although it is was not as significant today as it was at his nurse visit on Tuesday. The drainage is yellow and does not have the blue-green discoloration suggestive of Pseudomonas. The actual wounds on his leg are getting smaller, but the periwound skin is quite excoriated. Electronic Signature(s) Signed: 02/18/2023 11:47:51 AM By: Duanne Guess MD FACS Entered By: Duanne Guess on 02/18/2023  11:47:51 -------------------------------------------------------------------------------- Physician Orders Details Patient Name: Date of Service: [name] 02/18/2023 11:00 A M Medical Record Number: [id number] Patient Account Number: [id number] Date of Birth/Sex: Treating RN: 06-Jun-1951 (72 y.o. 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 - Dr. [name] RM 1 Return A Friday 8/2 @ 09:30 am Nurse Visit: - Tuesday RM 2 7/30 @ 3:00 pm Anesthetic (In clinic) Topical Lidocaine 4% applied to wound bed Bathing/ Shower/ Hygiene May shower with protection but do not get wound dressing(s) wet. Protect dressing(s) with water repellant cover (for example, large plastic bag) or a cast cover and may then take shower. Edema Control - Lymphedema / SCD / Other Lymphedema Pumps. Use Lymphedema pumps on leg(s) 2-3 times a day for 45-60 minutes. If wearing any wraps or hose, do not remove them. Continue exercising as instructed. Elevate legs to the level of the heart or above for 30 minutes daily and/or when sitting for 3-4 times a day throughout the day. - throughout the day Avoid standing for long periods of time. Compression stocking or Garment 20-30 mm/Hg pressure to: -  juxtalite left leg daily Other Edema Control Orders/Instructions: - kerlix/coban wrap to left leg while wife recovers from surgery Wound Treatment Wound #3 - Lower Leg Wound Laterality: Right, Medial Cleanser: Soap and Water 2 x Per Week/30 Days Discharge Instructions: May shower and wash wound with dial antibacterial soap and water prior to dressing change. Cleanser: Vashe 5.8 (oz) 2 x Per Week/30 Days Discharge Instructions: Cleanse the wound with Vashe prior to applying a clean dressing using gauze sponges, not tissue or cotton balls. Peri-Wound Care: Zinc Oxide Ointment 30g tube 2 x Per Week/30 Days Discharge Instructions: Apply Zinc Oxide to periwound with each dressing change Peri-Wound Care: Sween Lotion (Moisturizing lotion) 2 x Per Week/30 Days Discharge Instructions: Apply moisturizing lotion as directed Topical: Gentamicin 2 x Per Week/30 Days Discharge Instructions: mixed 50:50 with mupirocin to wound bed Topical: Mupirocin Ointment 2 x Per Week/30 Days Discharge Instructions: Apply Mupirocin (Bactroban) as instructed Prim Dressing: Hydrofera Blue Ready Transfer Foam, 4x5 (in/in) 2 x Per Week/30 Days ary Discharge Instructions: Apply to wound bed if urgo clean unavailable Prim Dressing: Maxorb Extra Ag+ Alginate Dressing, 4x4.75 (in/in) 2 x Per Week/30 Days ary Discharge Instructions: Apply to macerated Periwound as instructed Prim Dressing: UrgoClean AG 4 X 5 (in/in) 2 x Per Week/30 Days ary Discharge Instructions: Apply directly to wound bed. Secondary Dressing: ABD Pad, 8x10 2 x Per Week/30 Days Discharge Instructions: Apply  over primary dressing as directed. Secondary Dressing: Zetuvit Plus 4x8 in 2 x Per Week/30 Days Discharge Instructions: Apply over primary dressing as directed. Compression Wrap: ThreePress (3 layer compression wrap) 2 x Per Week/30 Days Discharge Instructions: Apply three layer compression as directed. Wound #6 - Lower Leg Wound Laterality: Right,  Anterior Peri-Wound Care: Sween Lotion (Moisturizing lotion) 2 x Per Week/30 Days Discharge Instructions: Apply moisturizing lotion as directed Topical: Gentamicin 2 x Per Week/30 Days Discharge Instructions: As directed by physician Topical: Mupirocin Ointment 2 x Per Week/30 Days Discharge Instructions: Apply Mupirocin (Bactroban) as instructed Prim Dressing: Maxorb Extra Ag+ Alginate Dressing, 4x4.75 (in/in) 2 x Per Week/30 Days ary Discharge Instructions: Apply to macerated periwound as instructed Prim Dressing: UrgoClean AG 4 X 5 (in/in) 2 x Per Week/30 Days ary Discharge Instructions: Apply directly to wound bed. Secondary Dressing: Woven Gauze Sponge, Non-Sterile 4x4 in 2 x Per Week/30 Days Discharge Instructions: Apply over primary dressing as directed. [name], [name] ([id number]) [id number] Page 7 of 15 Compression Wrap: ThreePress (3 layer compression wrap) 2 x Per Week/30 Days Discharge Instructions: Apply three layer compression as directed. Laboratory naerobe culture (MICRO) - right medial lower leg Bacteria identified in Unspecified specimen by A LOINC Code: 635-3 Convenience Name: Anaerobic culture Electronic Signature(s) Signed: 02/18/2023 11:50:23 AM By: Duanne Guess MD FACS Entered By: Duanne Guess on 02/18/2023 11:48:15 -------------------------------------------------------------------------------- Problem List Details Patient Name: Date of Service: [name] 02/18/2023 11:00 A M Medical Record Number: [id number] Patient Account Number: [id number] Date of Birth/Sex: Treating RN: 1951-03-10 (72 y.o. 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) 02/09/2022 No Yes I89.0 Lymphedema, not elsewhere classified 02/09/2022 No Yes I10 Essential (primary) hypertension 02/09/2022 No Yes I25.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 angina pectoris 02/09/2022 No Yes Z86.718 Personal history of other venous thrombosis and embolism 02/09/2022 No Yes Inactive Problems ICD-10 Code Description Active Date Inactive Date L97.512 Non-pressure chronic ulcer of other part of right foot with fat layer exposed 02/17/2022 02/17/2022 [name] ([id number]) [id number] Page 8 of 15 Resolved Problems ICD-10 Code Description Active Date Resolved Date L97.522 Non-pressure chronic ulcer of other part of left foot with fat layer exposed 02/09/2022 02/09/2022 Electronic Signature(s) Signed: 02/18/2023 11:45:30 AM By: Duanne Guess MD FACS Entered By: Duanne Guess on 02/18/2023 11:45:30 -------------------------------------------------------------------------------- Progress Note Details Patient Name: Date of Service: [name] 02/18/2023 11:00 A M Medical Record Number: [id number] Patient Account Number: [id number] Date of Birth/Sex: Treating RN: [birth year] (72 y.o. M) Primary Care Provider: [name] Other Clinician: Referring Provider: Treating Provider/Extender: [name] in Treatment: 66 Subjective Chief Complaint Information obtained from Patient Patient seen for complaints of Non-Healing Wounds. History of Present Illness (HPI) ADMISSION 02/09/2022 This is a 72 year old man who recently moved to [place] from [place]. He has a history of of coronary artery disease and prior left popliteal vein DVT . He is not diabetic and does not smoke. 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 He has had multiple spinal surgeries  and has limited mobility. He primarily uses a motorized wheelchair. He has had lymphedema for many years and does have lymphedema pumps although he  says he does not use them frequently. He has wounds on his bilateral lower extremities. He was receiving care in [place] for these prior to his move. They were apparently packing his wounds with Betadine soaked gauze. He has worn compression wraps in the past but not recently. He did say that they helped tremendously. In clinic today, his right ABI is noncompressible but has good signals; the left ABI was 1.17. No formal vascular studies have been performed. On his left dorsal foot, there is a circular lesion that exposes the fat layer. There is fairly heavy slough accumulation within the wound, but no significant odor. On his right medial calf, he has multiple pinholes that have slough buildup in them and probe for about 2 to 4 mm in depth. They are draining clear fluid. On his right lateral midfoot, he has ulceration consistent with venous stasis. It is geographic and has slough accumulation. 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 02/17/2022: All of the wounds are little bit larger today and have more slough accumulation. Today, the medial calf openings are larger and probe to something that feels calcified. There is odor coming from his wounds. His vascular studies are scheduled for August 9 and 10. 02-24-2022 upon evaluation today patient presents for follow-up concerning his ongoing issues here with his lower extremities. With that being said he does have significant problems with what appears to be cellulitis unfortunately the PCR culture that was sent last week got crushed by [employer] in transit. With that being said we are going to reobtain a culture today although I am actually to do it on the right medial leg as this seems to be the most inflamed area today where  there is some questionable drainage as well. I am going to remove some of the calcium deposits which are loosening obtain a deeper culture from this area. Fortunately I do not see any evidence of active infection systemically which is good news but locally I think we are having a bigger issue here. He does have his appointment next Thursday with vascular. Patient has also been referred to [facility] for further evaluation and treatment of the calcinosis for possible sulfate injections. 03/04/2022: The culture that was performed last week grew out multiple species including Klebsiella, Morganella, and Pseudomonas aeruginosa. He had been prescribed Bactrim but this was not adequate to cover all of the species and levofloxacin was recommended. He completed the Bactrim but did not pick up the levofloxacin and begin taking it until yesterday. 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 He had arterial studies performed today. He does have evidence of peripheral arterial disease. Results copied here: +-------+-----------+-----------+------------+------------+ ABI/TBIT oday's ABIT oday's TBIPrevious ABIPrevious TBI +-------+-----------+-----------+------------+------------+ Right Goth[place] 0.75    +-------+-----------+-----------+------------+------------+ Left Hiko 0.57    +-------+-----------+-----------+------------+------------+ Arterial wall calcification precludes accurate ankle pressures and ABIs. [name], [name] ([id number]) [id number] Page 9 of 15 Summary: Right: Resting right ankle-brachial index indicates noncompressible right lower extremity arteries. The right toe-brachial index is normal. Left: Resting left ankle-brachial index indicates noncompressible left lower extremity arteries. The  left toe-brachial index is abnormal. He continues to have further tissue breakdown at the right medial calf and there is ample slough accumulation along with necrotic subcutaneous tissue. The left dorsal foot wound has built up slough, as well.  The right medial foot wound is nearly closed with just a light layer of eschar over the surface. The right dorsal lateral foot wound has also accumulated a fair amount of slough and remains quite tender. 03/10/2022: He has been taking the prescribed levofloxacin and has about 3 days left of this. The erythema and induration on his right leg has improved. He continues to experience further breakdown of the right medial calf wound. There is extensive slough and debris accumulation there. There is heavy slough on his left dorsal foot wound, but no odor or purulent drainage. The right medial foot wound appears to be closed. The right dorsal lateral foot wound also has a fair amount of slough but it is less tender than at our last visit. He still does not have an appointment with dermatology. 03/22/2022: The right anterior tibial wound has closed. The right lateral foot wound is nearly closed with just a little bit of slough. The left dorsal foot wound is smaller and continues to have some slough buildup but less so than at prior visits. There is good granulation tissue underlying the slough. Unfortunately the right medial calf wound continues to deteriorate. It has a foul odor today and a lot of necrotic tissue, the surrounding skin is also erythematous and moist. 03/30/2022: The right lateral foot wound continues to contract and just has a little bit of slough and eschar present. The left dorsal foot wound is also smaller with slough overlying good granulation tissue. The right medial calf wound actually looks quite a bit better today; there is no odor and there is much less necrotic tissue although some remains present. We have been using topical gentamicin and he has  been taking oral Augmentin. 04/07/2022: The patient saw dermatology at [facility] last week. Unfortunately, it appears that they did not read any of the documentation that was provided. 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 Overall, it was a highly unsatisfactory consultation. In the meantime, however, we were able to identify compounding pharmacy who could create a topical sodium thiosulfate ointment. The patient has that with him today. Both of the right foot wounds are now closed. The left dorsal foot wound has contracted but still has a layer of slough on the surface. The medial calf wounds on the right all look a little bit better. They still have heavy slough along with the chunks of calcium. Everything is stained purple. 04/14/2022: The wound on his dorsal left foot is a little bit smaller again today. There is still slough accumulation on the surface. The medial calf wounds have quite a bit less slough accumulation today. His right leg, however, is warm and erythematous, concerning for cellulitis. 04/14/2022: He completed his course of Augmentin yesterday. The medial calf wound sites are much cleaner today and shallower. There is less fragmented calcium in the wounds. He has a lot of lymphedema fluid-like drainage from these wounds, but no purulent discharge or malodor. The wound on his dorsal left foot has also contracted and is shallower. There is a layer of slough over healthy granulation tissue. 05/05/2022: The left dorsal foot wound is smaller today with less slough and more granulation tissue. The right medial calf wounds are shallower, but have extensive slough and some fat necrosis.  The drainage has diminished  considerably, however. 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 05/12/2022: The left dorsal foot wound continues to contract and fill with granulation tissue. There is slough on the wound surface. The right medial calf wounds also continue to fill with healthy tissue, but there is remaining slough and fat necrosis present. He had a significant increase in his drainage this week and it was a bright yellow-green color. He also had a new wound open over his right anterior tibial surface associated with the spike of cutaneous calcium. The leg is more red, as well, concerning for infection. 05/19/2022: His culture returned positive for Pseudomonas. He is currently taking a course of oral levofloxacin. We have also been using topical gentamicin mixed in with his sodium thiosulfate. All of the wounds look better this week. 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 There is slough and nonviable subcutaneous tissue still present. The new anterior tibial wound just has a light layer of slough. The dorsal foot wound also has some slough present and appears a little dry today. 05/26/2022: The right medial leg wound continues to improve. It is feeling with granulation tissue, but still accumulates a fairly thick layer of slough on the surface. The more recent anterior tibial wound has remained quite small, but also has some slough on the surface. The left dorsal foot wound has contracted somewhat and has perimeter epithelialization. He completed his oral levofloxacin. 06/02/2022: The left dorsal foot wound continues to improve significantly. There is just a little slough on the wound surface. The right medial leg wound had black drainage, but it looks like silver alginate was applied last week and I theorized that silver sulfide was created with a combination of the  silver alginate and the sodium thiosulfate. It did, however, have a bit of an odor to it and extensive slough accumulation. The small anterior tibial wound also had a bit of slough and nonviable subcutaneous tissue present. The skin of his leg was rather macerated, as well. 06/09/2022: The left dorsal foot wound is smaller again this week with just a light layer of slough on the surface. The right medial leg wound looks substantially better. The leg and periwound skin are no longer red and macerated. There is good granulation tissue forming with less slough and nonviable tissue. The anterior tibial wound just has a small amount of slough accumulation. 06/16/2022: The left dorsal foot wound continues to contract. His wrap slipped a little bit, however, and his foot is more swollen. The right medial leg wound continues to improve. The underlying tissue is more vital-appearing and there is less slough. He does have substantial drainage. The small anterior tibial wound has a bit of slough accumulation. 06/23/2022: The left dorsal foot wound is about half the size as it was last week. There is just some light slough on the surface and some eschar buildup around the edges. The right anterior leg wound is small with a little slough on the surface. Unfortunately, the right medial leg wound deteriorated fairly substantially. It is malodorous with gray/green/black discoloration. 06/30/2022: The left dorsal foot wound continues to contract. It is nearly closed with just a light layer of slough present. The right anterior leg wound is about the same size and has a small amount of slough on the surface. The right medial leg wound looks a lot better  than it did last week. The odor has abated and the tissue is less pulpy and necrotic-looking. There is still a fairly thick layer of slough present. 12/13; the left dorsal foot wound looks very healthy. There was no need to change the dressing here and no debridement. He  has a small area on the right anterior lower leg apparently had not changed much in size. The area on the medial leg is the most problematic area this is large with some depth and a completely nonviable surface today. 07/14/2022: The left dorsal foot wound is smaller today with just a little slough and eschar present. The right anterior lower leg wound is about the same without any significant change. The medial leg wound has a black and green surface and is malodorous. No purulent drainage and the periwound skin is intact. Edema control is suboptimal. 07/21/2022: The dorsal foot wound continues to contract and is nearly closed with just a little bit of slough and eschar on the surface. The right anterior lower leg wound is shallower today but otherwise the dimensions are unchanged. The medial leg wound looks significantly better although it still has a nonviable surface; the odor and black/green surface has resolved. His culture came back with multiple species sensitive to Augmentin, which was prescribed and a very low level of Pseudomonas, which should be handled by the topical gentamicin we have been using. 07/28/2022: The dorsal foot wound is down to just a very tiny opening with a little eschar on the surface. The right anterior tibial wound is about the same with [name], [name] ([id number]) [id number] Page 10 of 15 some slough buildup. The right medial leg wound looks quite a bit better this week. There is thick rubbery slough on the surface, but the odor and drainage has improved significantly. 08/04/2022: The dorsal foot wound is almost healed, but the periwound skin has broken down. This appears to be secondary to moisture and adhesive from the absorbent pad. The right anterior tibial wound is shallower. The right medial leg wound continues to improve. It is smaller, still with rubbery slough on the surface. No malodor or purulent drainage. 08/11/2022: The dorsal foot  wound is about the same size. It is a little bit dry with some slough accumulation. The right anterior tibial wound is also unchanged. The right medial leg wound is filling in further with good granulation tissue. Still with some slough buildup on the surface. 08/18/2022: His left leg is bright red, hot, and swollen with cellulitis. The dorsal foot wound actually looks quite good and is superficial with just a little bit of slough accumulation. The right anterior tibial wound is unchanged. The right medial leg wound has filled in with good granulation tissue but has copious amounts of drainage. 08/25/2022: His cellulitis has responded nicely to the oral antibiotics. His dorsal foot wound is smaller but has a fair amount of slough buildup. The right anterior tibial wound remains stable and unchanged. The right medial leg wound has more granulation tissue under a layer of slough, but continues to have copious drainage. The drainage is actually causing skin irritation and threatens to result in breakdown. 09/01/2022: He continues to have profuse drainage from his wounds which is resulting in tissue maceration on both the dorsum of his left foot and the periwound distal to his medial leg wound. The dorsal foot wound is nearly healed, despite this. The medial right leg wound is filling in with good granulation tissue. We are working on getting home health assistance  to change his dressings more frequently to try and control the drainage better. 09/08/2022: We have had success in controlling his drainage. The tissue maceration has improved significantly and his swelling has come down quite markedly. The medial right leg wound is much more superficial and has substantially less nonviable tissue present. The anterior tibial wound is a little bit larger, however with some nonviable tissue present. 09/15/2022: The dorsal foot wound is nearly closed. The anterior tibial wound measures slightly larger today. The medial  right leg wound continues to improve quite dramatically with less nonviable tissue and more robust-looking granulation. Edema control is significantly better. 09/22/2022: The left dorsal foot wound remains open, albeit quite tiny. The anterior tibial wound is a little bit larger again today. The medial right leg wound continues to improve with an increase in robust and healthy granulation tissue and less accumulation of nonviable tissue. 09/29/2022: His home health providers wrapped his left leg extremely tightly and he ended up having to cut the wrap off of his foot. This resulted in his foot being very swollen and that the dorsal foot wound is a little bit larger today. The anterior tibial wound is about the same size with a little slough on the surface. The medial right leg wound continues to fill in. There are very few areas with any significant depth. There is still slough accumulation. 3/13; Patient has a left dorsal foot wound along with a right anterior tibial wound and right medial wound. We are using silver alginate to the left dorsal foot wound, Santyl and sodium thiosulfate compound ointment to the right lower extremity wounds under 4 layer compression. Patient has no issues or complaints today. 10/13/2022: The left dorsal foot wound is a little bit bigger today, but his foot is also a little bit more swollen. The right medial leg wound has filled in more and has substantially less depth. There is still some nonviable tissue present. The small anterior lower leg wound looks about the same. 10/20/2022: The left dorsal foot wound is epithelializing. There does not appear to be much open at this site. There is some eschar around the edges. The right medial leg wound continues to fill in with granulation tissue. The small anterior lower leg wound is a little bit bigger and there is a chunk of calcium protruding. He continues to have fairly copious drainage from the right leg. 10/27/2022: His home  health agency failed to show up at all this past week and as a result, the excessive drainage from his right leg wound has resulted in tissue breakdown. His leg is red and irritated. The wounds themselves look about the same. The wound on his left dorsal foot is nearly closed underneath some eschar. 11/03/2022: We continue to have difficulty with his home health agency. He continues to have significant drainage from his right leg wound which has resulted in even further tissue breakdown. He also was not taking his Lasix as prescribed and I think much of the drainage has been his lymphedema fluid choosing the path of least resistance. The wound on his left dorsal foot is closed, but in the process of cleaning the site, dry skin was pulled off and he has a new small superficial wound just adjacent to where the dorsal foot wound was. 11/12/2022: His left dorsal foot is completely healed. The right medial leg continues to deteriorate. He is not taking his Lasix as prescribed, nor is he using his lymphedema pumps at all. As a result, he has  massive amounts of drainage saturating his dressings and causing further tissue breakdown on his medial leg. Today, his dressings had a foul odor, as well. 11/19/2022: His right medial leg looks even worse today. There is more erythema and moisture related tissue breakdown. The 2 anterior wounds look a little bit better. The culture that I took last week did produce Pseudomonas and he is currently taking levofloxacin for this. 11/26/2022: There has been significant improvement to his right medial leg. It is far less raw and many of the superficial open areas have epithelialized. He continues to cut the foot portion of his wrap and as result his foot is bulbous and swollen compared to the rest of his leg, where edema control is good. The smaller of the 2 anterior tibial wound is closed, while the larger is contracting somewhat with a bit of slough at the base. 12/03/2022: His  leg continues to demonstrate improvement. There has been epithelialization of much of the denuded area. He has a new small anterior tibial wound that has opened around a nidus of calcium. Edema control is improved. 12/10/2022: Continued improvement in the periwound skin. The anterior tibial wounds are about the same, but the medial leg wound has improved further. There is less nonviable tissue present. 12/17/2022: Between his visit on Tuesday with the nurse and today, he has had significantly more drainage which has resulted in some periwound tissue maceration. I suspect this correlates somewhat with his diuretic use, as he says he takes his diuretic religiously on Saturday, Sunday, and Monday and then does not take it after Tuesday due to being out and about and not being close to easy restroom access. I suspect the excess fluid is just simply draining out of his leg. 12/21/2022: This was supposed to be a nurse visit, but his leg has broken down so significantly, that we have converted to a wound care visit. He did take his diuretics over the weekend, but despite this, he has had copious drainage and all of his tissues have broken down substantially from the right medial leg ulcer all the way down to his ankle and even wrapping posterior to this. There is blue-green staining on his dressing, suggesting ongoing presence of Pseudomonas aeruginosa. 12/24/2022: The wound already looks better than it did 2 days ago. He has been taking ciprofloxacin and the Urgo clean dressing we applied did a much better job of wicking the drainage away from his leg. There is slough on the entire surface of the medial leg wound, but the erythema and induration has improved in the past couple of days. 12/31/2022: Continued improvement of the wound. He says that the pain has basically abated since being on ciprofloxacin. Still with fairly heavy drainage and slough accumulation. 01/07/2023: Overall continued improvement. Still  with heavy slough buildup, but less drainage. [name], [name] ([id number]) [id number] Page 11 of 15 01/14/2023: There is a little bit of improvement. The skin is still looking a bit excoriated. He has a few more days left of ciprofloxacin. 01/21/2023: There has been significant improvement. The periwound skin is in much better condition. The ulcers are shallower and actually have some good granulation tissue present under some slough. 01/28/2023: The wound continues to improve. All of the open areas measured smaller today. There is slough accumulation on the surfaces. His drainage has decreased markedly. 02/04/2023: Continued improvement in the wound. Drainage remains significantly diminished. Slough accumulation is present but to a lesser extent. Periwound skin continues to improve. Edema control is good. 02/18/2023:  The heavy drainage persists, although it is was not as significant today as it was at his nurse visit on Tuesday. The drainage is yellow and does not have the blue-green discoloration suggestive of Pseudomonas. The actual wounds on his leg are getting smaller, but the periwound skin is quite excoriated. Patient History Information obtained from Patient, Chart. Family History Cancer - Mother, Heart Disease - Father,Mother, Hypertension - Father,Siblings, Lung Disease - Siblings, No family history of Diabetes, Hereditary Spherocytosis, Kidney Disease, Seizures, Stroke, Thyroid Problems, Tuberculosis. Social History Former smoker - quit 1991, Marital Status - Married, Alcohol Use - Moderate, Drug Use - No History, Caffeine Use - Daily - coffee. Medical History Eyes Patient has history of Cataracts - right eye removed Denies history of Glaucoma, Optic Neuritis Ear/Nose/Mouth/Throat Denies history of Chronic sinus problems/congestion, Middle ear problems Hematologic/Lymphatic Patient has history of Anemia - macrocytic, Lymphedema Cardiovascular Patient has  history of Congestive Heart Failure, Deep Vein Thrombosis - left leg, Hypertension, Peripheral Venous Disease Endocrine Denies history of Type I Diabetes, Type II Diabetes Genitourinary Denies history of End Stage Renal Disease Integumentary (Skin) Denies history of History of Burn Oncologic Denies history of Received Chemotherapy, Received Radiation Psychiatric Denies history of Anorexia/bulimia, Confinement Anxiety Hospitalization/Surgery History - cervical fusion. - lumbar surgery. - coronary stent placement. - lasik eye surgery. - hydrocele excision. Medical A Surgical History Notes nd Constitutional Symptoms (General Health) obesity Ear/Nose/Mouth/Throat hard of hearing Respiratory frozen diaphragm right Cardiovascular hyperlipidemia Musculoskeletal myositis, lumbar DDD, spinal stenosis, cervical facet joint syndrome Objective Constitutional Hypertensive, asymptomatic. no acute distress. Vitals Time Taken: 10:52 AM, Height: 71 in, Weight: 267 lbs, BMI: 37.2, Temperature: 97.5 F, Pulse: 83 bpm, Respiratory Rate: 20 breaths/min, Blood Pressure: 168/81 mmHg. Respiratory Normal work of breathing on room air. General Notes: 02/18/2023: The heavy drainage persists, although it is was not as significant today as it was at his nurse visit on Tuesday. The drainage is yellow and does not have the blue-green discoloration suggestive of Pseudomonas. The actual wounds on his leg are getting smaller, but the periwound skin is quite excoriated. Integumentary (Hair, Skin) Wound #3 status is Open. Original cause of wound was Gradually Appeared. The date acquired was: 12/07/2021. The wound has been in treatment 53 weeks. The wound is located on the Right,Medial Lower Leg. The wound measures 9cm length x 5.8cm width x 0.3cm depth; 40.998cm^2 area and 12.299cm^3 volume. There is Fat Layer (Subcutaneous Tissue) exposed. There is no tunneling or undermining noted. There is a medium amount of  serosanguineous drainage noted. [name], [name] ([id number]) [id number] Page 12 of 15 The wound margin is indistinct and nonvisible. There is large (67-100%) red granulation within the wound bed. There is a small (1-33%) amount of necrotic tissue within the wound bed including Adherent Slough. The periwound skin appearance had no abnormalities noted for moisture. The periwound skin appearance exhibited: Excoriation, Erythema. The surrounding wound skin color is noted with erythema which is circumferential. Periwound temperature was noted as No Abnormality. Wound #6 status is Open. Original cause of wound was Gradually Appeared. The date acquired was: 05/12/2022. The wound has been in treatment 40 weeks. The wound is located on the Right,Anterior Lower Leg. The wound measures 1.8cm length x 0.4cm width x 0.2cm depth; 0.565cm^2 area and 0.113cm^3 volume. There is Fat Layer (Subcutaneous Tissue) exposed. There is no tunneling or undermining noted. There is a medium amount of serous drainage noted. The wound margin is distinct with the outline attached to the wound base. There is medium (  34-66%) pink granulation within the wound bed. There is a medium (34- 66%) amount of necrotic tissue within the wound bed including Adherent Slough. The periwound skin appearance exhibited: Excoriation, Erythema. The surrounding wound skin color is noted with erythema. Periwound temperature was noted as No Abnormality. 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 There was a Selective/Open Wound Non-Viable Tissue Debridement with a total area of 16.39 sq cm performed by Duanne Guess, MD. With the following instrument(s): Curette to remove Non-Viable tissue/material. Material removed includes [facility] after achieving pain control using Lidocaine 4% T opical Solution. 1 specimen was taken by a Tissue Culture and sent to the lab per facility protocol. A time out was conducted at 11:20, prior to the start of the procedure. A Minimum amount of bleeding was controlled with Pressure. The procedure was tolerated well with a pain level of 2 throughout and a pain level of 0 following the procedure. Post Debridement Measurements: 9cm length x 5.8cm width x 0.3cm depth; 12.299cm^3 volume. Character of Wound/Ulcer Post Debridement requires further debridement. Post procedure Diagnosis Wound #3: Same as Pre-Procedure General Notes: : Scribed for Dr. [name] by Zenaida Deed, RN. Pre-procedure diagnosis of Wound #3 is a Lymphedema located on the Right,Medial Lower Leg . There was a Three Layer Compression Therapy Procedure by Zenaida Deed, RN. Post procedure Diagnosis Wound #3: Same as Pre-Procedure Plan Follow-up Appointments: Return Appointment in 1 week. - Dr. [name] RM 1 Friday 8/2 @ 09:30 am Nurse Visit: - Tuesday RM 2 7/30 @ 3:00 pm Anesthetic: (In clinic) Topical Lidocaine 4% applied to wound bed Bathing/ Shower/ Hygiene: May shower with protection but do not get wound dressing(s) wet. Protect dressing(s) with water repellant cover (for example, large plastic bag) or a cast cover and may then take shower. Edema Control - Lymphedema / SCD / Other: Lymphedema Pumps. Use Lymphedema pumps on leg(s) 2-3 times a day for 45-60 minutes. If wearing any wraps or hose, do not remove them. Continue exercising as instructed. Elevate legs to the level of the heart or above for 30 minutes daily and/or  when sitting for 3-4 times a day throughout the day. - throughout the day Avoid standing for long periods of time. Compression stocking or Garment 20-30 mm/Hg pressure to: - juxtalite left leg daily Other Edema Control Orders/Instructions: - kerlix/coban wrap to left leg while wife recovers from surgery Laboratory ordered were: Anaerobic culture - right medial lower leg WOUND #3: - Lower Leg Wound Laterality: Right, Medial Cleanser: Soap and Water 2 x Per Week/30 Days Discharge Instructions: May shower and wash wound with dial antibacterial soap and water prior to dressing change. Cleanser: Vashe 5.8 (oz) 2 x Per Week/30 Days Discharge Instructions: Cleanse the wound with Vashe prior to applying a clean dressing using gauze sponges, not tissue or cotton balls. Peri-Wound Care: Zinc Oxide Ointment 30g tube 2 x Per Week/30 Days Discharge Instructions: Apply Zinc Oxide to periwound with each dressing change Peri-Wound Care: Sween Lotion (Moisturizing lotion) 2 x Per Week/30 Days Discharge Instructions: Apply moisturizing lotion as directed Topical: Gentamicin 2 x Per Week/30 Days Discharge Instructions: mixed 50:50  with mupirocin to wound bed Topical: Mupirocin Ointment 2 x Per Week/30 Days Discharge Instructions: Apply Mupirocin (Bactroban) as instructed TAYVIAN, SALGUEIRO ([id number]) [id number] Page 13 of 15 Prim Dressing: Hydrofera Blue Ready Transfer Foam, 4x5 (in/in) 2 x Per Week/30 Days ary Discharge Instructions: Apply to wound bed if urgo clean unavailable Prim Dressing: Maxorb Extra Ag+ Alginate Dressing, 4x4.75 (in/in) 2 x Per Week/30 Days ary Discharge Instructions: Apply to macerated Periwound as instructed Prim Dressing: UrgoClean AG 4 X 5 (in/in) 2 x Per Week/30 Days ary Discharge Instructions: Apply directly to wound bed. Secondary Dressing: ABD Pad, 8x10 2 x Per Week/30 Days Discharge Instructions: Apply over primary dressing as directed. Secondary  Dressing: Zetuvit Plus 4x8 in 2 x Per Week/30 Days Discharge Instructions: Apply over primary dressing as directed. Com pression Wrap: ThreePress (3 layer compression wrap) 2 x Per Week/30 Days Discharge Instructions: Apply three layer compression as directed. WOUND #6: - Lower Leg Wound Laterality: Right, Anterior Peri-Wound Care: Sween Lotion (Moisturizing lotion) 2 x Per Week/30 Days Discharge Instructions: Apply moisturizing lotion as directed Topical: Gentamicin 2 x Per Week/30 Days Discharge Instructions: As directed by physician Topical: Mupirocin Ointment 2 x Per Week/30 Days Discharge Instructions: Apply Mupirocin (Bactroban) as instructed Prim Dressing: Maxorb Extra Ag+ Alginate Dressing, 4x4.75 (in/in) 2 x Per Week/30 Days ary Discharge Instructions: Apply to macerated periwound as instructed Prim Dressing: UrgoClean AG 4 X 5 (in/in) 2 x Per Week/30 Days ary Discharge Instructions: Apply directly to wound bed. Secondary Dressing: Woven Gauze Sponge, Non-Sterile 4x4 in 2 x Per Week/30 Days Discharge Instructions: Apply over primary dressing as directed. Com pression Wrap: ThreePress (3 layer compression wrap) 2 x Per Week/30 Days Discharge Instructions: Apply three layer compression as directed. 02/18/2023: The heavy drainage persists, although it is was not as significant today as it was at his nurse visit on Tuesday. The drainage is yellow and does not have the blue-green discoloration suggestive of Pseudomonas. The actual wounds on his leg are getting smaller, but the periwound skin is quite excoriated. I used a curette to debride slough from the open wound sites. We are going to hold off on the topical antibiotic ointments this week to see if perhaps he is getting some irritation from them. We will apply silver alginate to the excoriated skin and continue with the Urgo clean on the open wound site at the top of the area. I did take a culture today, because the increase in  drainage seem to start up at the same time that he stopped taking ciprofloxacin. I will follow-up the culture result and determine if any therapy is warranted. He will have a nurse visit on Tuesday and see next Friday. Electronic Signature(s) Signed: 02/18/2023 11:49:42 AM By: Duanne Guess MD FACS Entered By: Duanne Guess on 02/18/2023 11:49:42 -------------------------------------------------------------------------------- HxROS Details Patient Name: Date of Service: [name] 02/18/2023 11:00 A M Medical Record Number: [id number] Patient Account Number: [id number] Date of Birth/Sex: Treating RN: 05/07/1951 (72 y.o. M) Primary Care Provider: [name] Other Clinician: Referring Provider: Treating Provider/Extender: [name] in Treatment: 81 Information Obtained From Patient Chart Constitutional Symptoms (General Health) Medical History: Past Medical History Notes: obesity Eyes Medical History: Positive for: Cataracts - right eye removed Negative for: Glaucoma; Optic Neuritis Ear/Nose/Mouth/Throat Medical History: Negative for: Chronic sinus problems/congestion; Middle ear problems Past Medical History Notes: hard of hearing JADELL, VELTRI ([id number]) [id number] Page 14 of 15 Hematologic/Lymphatic Medical History: Positive for: Anemia - macrocytic; Lymphedema Respiratory Medical History: Past  Medical History Notes: frozen diaphragm right Cardiovascular Medical History: Positive for: Congestive Heart Failure; Deep Vein Thrombosis - left leg; Hypertension; Peripheral Venous Disease Past Medical History Notes: hyperlipidemia Endocrine Medical History: Negative for: Type I Diabetes; Type II Diabetes Genitourinary Medical History: Negative for: End Stage Renal Disease Integumentary (Skin) Medical History: Negative for: History of Burn Musculoskeletal Medical History: Past Medical History  Notes: myositis, lumbar DDD, spinal stenosis, cervical facet joint syndrome Oncologic Medical History: Negative for: Received Chemotherapy; Received Radiation Psychiatric Medical History: Negative for: Anorexia/bulimia; Confinement Anxiety HBO Extended History Items Eyes: Cataracts Immunizations Pneumococcal Vaccine: Received Pneumococcal Vaccination: Yes Received Pneumococcal Vaccination On or After 60th Birthday: Yes Implantable Devices No devices added Hospitalization / Surgery History Type of Hospitalization/Surgery cervical fusion lumbar surgery coronary stent placement lasik eye surgery hydrocele excision Family and Social History Cancer: Yes - Mother; Diabetes: No; Heart Disease: Yes - Father,Mother; Hereditary Spherocytosis: No; Hypertension: Yes - Father,Siblings; Kidney Disease: No; Lung Disease: Yes - Siblings; Seizures: No; Stroke: No; Thyroid Problems: No; Tuberculosis: No; Former smoker - quit 1991; Marital Status - Married; Alcohol Use: Moderate; Drug Use: No History; Caffeine Use: Daily - coffee; Financial Concerns: No; Food, Clothing or Shelter Needs: No; Support System Lacking: No; Transportation Concerns: No SONJA, STIEG ([id number]) [id number] Page 15 of 15 Electronic Signature(s) Signed: 02/18/2023 11:50:23 AM By: Duanne Guess MD FACS Entered By: Duanne Guess on 02/18/2023 11:47:22 -------------------------------------------------------------------------------- SuperBill Details Patient Name: Date of Service: LUCAH, DIAMANT 02/18/2023 Medical Record Number: [id number] Patient Account Number: [id number] Date of Birth/Sex: Treating RN: [birth year] (72 y.o. M) Primary Care Provider: [name] Other Clinician: Referring Provider: Treating Provider/Extender: [name] in Treatment: 53 Diagnosis Coding ICD-10 Codes Code Description [phone] Non-pressure chronic ulcer of other part of right  lower leg with fat layer exposed 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) I89.0 Lymphedema, not elsewhere classified I10 Essential (primary) hypertension I25.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 angina pectoris Z86.718 Personal history of other venous thrombosis and embolism Facility Procedures : CPT4 Code: [id number] Description: 97597 - DEBRIDE WOUND 1ST 20 SQ CM OR < ICD-10 Diagnosis Description 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 Modifier: ed Quantity: 1 Physician Procedures : CPT4 Code Description Modifier [id number] - WC PHYS LEVEL 3 - EST PT 25 ICD-10 Diagnosis Description 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) I89.0 Lymphedema, not elsewhere classified Quantity: 1 : [id number] - WC PHYS DEBR WO ANESTH 20 SQ CM ICD-10 Diagnosis Description 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ed Quantity: 1 Electronic Signature(s) Signed: 02/18/2023 11:50:02 AM By: Duanne Guess MD FACS Entered By: Duanne Guess on 02/18/2023 11:50:02

## 2023-02-18 NOTE — Progress Notes (Signed)
SRIRAM, RHINEHART ([id number]) [id number] Page 1 of 10 Visit Report for 02/18/2023 Arrival Information Details Patient Name: Date of Service: [name], [name] 02/18/2023 11:00 A M Medical Record Number: [id number] Patient Account Number: [id number] Date of Birth/Sex: Treating RN: [birth year] (72 y.o. [name] Primary Care Alajah Witman: [name] Other Clinician: Referring Greysyn Vanderberg: Treating Dodge Ator/Extender: [name] in Treatment: 59 Visit Information History Since Last Visit Added or deleted any medications: No Patient Arrived: Wheel Chair Any new allergies or adverse reactions: No Arrival Time: 10:48 Had a fall or experienced change in No Accompanied By: self activities of daily living that may affect Transfer Assistance: None risk of falls: Patient Identification Verified: Yes Signs or symptoms of abuse/neglect since last visito No Secondary Verification Process Completed: Yes Hospitalized since last visit: No Patient Requires Transmission-Based Precautions: No Implantable device outside of the clinic excluding No Patient Has Alerts: Yes cellular tissue based products placed in the center Patient Alerts: R ABI= N/C, TBI= .75 since last visit: L ABI =N/C, TBI = .57 Has Dressing in Place as Prescribed: Yes Has Compression in Place as Prescribed: Yes Pain Present Now: Yes Electronic Signature(s) Signed: 02/18/2023 12:12:40 PM By: Zenaida Deed RN, BSN Entered By: Zenaida Deed on 02/18/2023 10:52:48 -------------------------------------------------------------------------------- Compression Therapy Details Patient Name: Date of Service: [name] 02/18/2023 11:00 A M Medical Record Number: [id number] Patient Account Number: [id number] Date of Birth/Sex: Treating RN: 1951-02-12 (72 y.o. [name] Primary Care Shad Ledvina: [name] Other Clinician: Referring Domonique Cothran: Treating Ethelbert Thain/Extender:  [name] in Treatment: 08 Compression Therapy Performed for Wound Assessment: Wound #3 Right,Medial Lower Leg Performed By: Clinician Zenaida Deed, RN Compression Type: Three Layer Post Procedure Diagnosis Same as Pre-procedure Electronic Signature(s) Signed: 02/18/2023 12:12:40 PM By: Zenaida Deed RN, BSN Entered By: Zenaida Deed on 02/18/2023 11:28:56 [name] ([id number]) [id number] Page 2 of 10 -------------------------------------------------------------------------------- Encounter Discharge Information Details Patient Name: Date of Service: [name], [name] 02/18/2023 11:00 A M Medical Record Number: [id number] Patient Account Number: [id number] Date of Birth/Sex: Treating RN: 1950/10/30 (72 y.o. [name] Primary Care Chrysta Fulcher: [name] Other Clinician: Referring Hermie Reagor: Treating Jaylynn Siefert/Extender: [name] in Treatment: 9 Encounter Discharge Information Items Post Procedure Vitals Discharge Condition: Stable Temperature (F): 97.5 Ambulatory Status: Wheelchair Pulse (bpm): 83 Discharge Destination: Home Respiratory Rate (breaths/min): 20 Transportation: Private Auto Blood Pressure (mmHg): 168/81 Accompanied By: self Schedule Follow-up Appointment: Yes Clinical Summary of Care: Patient Declined Electronic Signature(s) Signed: 02/18/2023 12:12:40 PM By: Zenaida Deed RN, BSN Entered By: Zenaida Deed on 02/18/2023 12:07:48 -------------------------------------------------------------------------------- Lower Extremity Assessment Details Patient Name: Date of Service: [name], [name] 02/18/2023 11:00 A M Medical Record Number: [id number] Patient Account Number: [id number] Date of Birth/Sex: Treating RN: 06-03-1951 (72 y.o. [name] Primary Care Janna Oak: [name] Other Clinician: Referring Takeila Thayne: Treating Bowie Delia/Extender: [name] in Treatment: 53 Edema Assessment Assessed: Kyra Searles: No] Franne Forts: No] Edema: [Left: Yes] [Right: Yes] Calf Left: Right: Point of Measurement: From Medial Instep 41.5 cm 41 cm Ankle Left: Right: Point of Measurement: From Medial Instep 24.5 cm 24 cm Vascular Assessment Pulses: Dorsalis Pedis Palpable: [Left:Yes] [Right:Yes] Extremity colors, hair growth, and conditions: Extremity Color: [Left:Hyperpigmented] [Right:Hyperpigmented] Hair Growth on Extremity: [Left:Yes] [Right:Yes] Temperature of Extremity: [Left:Warm < 3 seconds] [Right:Warm < 3 seconds] Electronic Signature(s) Signed: 02/18/2023 12:12:40 PM By: Zenaida Deed RN, BSN Entered By: Zenaida Deed on 02/18/2023 10:59:29 [name] ([id number]) [id number] Page 3 of 10 -------------------------------------------------------------------------------- Multi Wound  Chart Details Patient Name: Date of Service: [name], [name] 02/18/2023 11:00 A M Medical Record Number: [id number] Patient Account Number: [id number] Date of Birth/Sex: Treating RN: [birth year] (72 y.o. M) Primary Care Anylah Scheib: [name] Other Clinician: Referring Jerrel Tiberio: Treating Evagelia Knack/Extender: [name] in Treatment: 2 Vital Signs Height(in): 71 Pulse(bpm): 83 Weight(lbs): 267 Blood Pressure(mmHg): 168/81 Body Mass Index(BMI): 37.2 Temperature(F): 97.5 Respiratory Rate(breaths/min): 20 [3:Photos:] [N/A:N/A] Right, Medial Lower Leg Right, Anterior Lower Leg N/A Wound Location: Gradually Appeared Gradually Appeared N/A Wounding Event: Lymphedema Calcinosis N/A Primary Etiology: Cataracts, Anemia, Lymphedema, Cataracts, Anemia, Lymphedema, N/A Comorbid History: Congestive Heart Failure, Deep Vein Congestive Heart Failure, Deep Vein Thrombosis, Hypertension, Peripheral Thrombosis, Hypertension, Peripheral Venous Disease Venous Disease 12/07/2021 05/12/2022  N/A Date Acquired: 10 40 N/A Weeks of Treatment: Open Open N/A Wound Status: No No N/A Wound Recurrence: No Yes N/A Clustered Wound: N/A 2 N/A Clustered Quantity: 9x5.8x0.3 1.8x0.4x0.2 N/A Measurements L x W x D (cm) 40.998 0.565 N/A A (cm) : rea 12.299 0.113 N/A Volume (cm) : -667.60% -139.40% N/A % Reduction in A rea: -475.80% -370.80% N/A % Reduction in Volume: Full Thickness With Exposed Support Full Thickness Without Exposed N/A Classification: Structures Support Structures Medium Medium N/A Exudate A mount: Serosanguineous Serous N/A Exudate Type: red, brown amber N/A Exudate Color: Indistinct, nonvisible Distinct, outline attached N/A Wound Margin: Large (67-100%) Medium (34-66%) N/A Granulation A mount: Red Pink N/A Granulation Quality: Small (1-33%) Medium (34-66%) N/A Necrotic A mount: Fat Layer (Subcutaneous Tissue): Yes Fat Layer (Subcutaneous Tissue): Yes N/A Exposed Structures: Fascia: No Fascia: No Tendon: No Tendon: No Muscle: No Muscle: No Joint: No Joint: No Bone: No Bone: No Small (1-33%) Large (67-100%) N/A Epithelialization: Debridement - Selective/Open Wound N/A N/A Debridement: Pre-procedure Verification/Time Out 11:20 N/A N/A Taken: Lidocaine 4% Topical Solution N/A N/A Pain Control: Slough N/A N/A Tissue Debrided: Non-Viable Tissue N/A N/A Level: 16.39 N/A N/A Debridement A (sq cm): rea Curette N/A N/A Instrument: Minimum N/A N/A Bleeding: Pressure N/A N/A Hemostasis A chieved: 2 N/A N/A Procedural Pain: 0 N/A N/A Post Procedural Pain: Procedure was tolerated well N/A N/A Debridement Treatment Response: 9x5.8x0.3 N/A N/A Post Debridement Measurements L x W x D (cm) 12.299 N/A N/A Post Debridement Volume: (cm) [name] ([id number]) [id number] Page 4 of 10 Excoriation: Yes Excoriation: Yes N/A Periwound Skin Texture: No Abnormalities Noted N/A Periwound Skin  Moisture: Erythema: Yes Erythema: Yes N/A Periwound Skin Color: Circumferential N/A N/A Erythema Location: No Abnormality No Abnormality N/A Temperature: N/A probes to calcium deposits N/A Assessment Notes: Compression Therapy N/A N/A Procedures Performed: Debridement Treatment Notes Electronic Signature(s) Signed: 02/18/2023 11:45:37 AM By: Duanne Guess MD FACS Entered By: Duanne Guess on 02/18/2023 11:45:37 -------------------------------------------------------------------------------- Multi-Disciplinary Care Plan Details Patient Name: Date of Service: [name] 02/18/2023 11:00 A M Medical Record Number: [id number] Patient Account Number: [id number] Date of Birth/Sex: Treating RN: 31-Mar-1951 (72 y.o. [name] Primary Care Katherine Tout: [name] Other Clinician: Referring Briggette Najarian: Treating Kijana Estock/Extender: [name] in Treatment: 60 Multidisciplinary Care Plan reviewed with physician Active Inactive Venous Leg Ulcer Nursing Diagnoses: Knowledge deficit related to disease process and management Potential for venous Insuffiency (use before diagnosis confirmed) Goals: Patient will maintain optimal edema control Date Initiated: 02/17/2022 Target Resolution Date: 02/23/2023 Goal Status: Active Interventions: Assess peripheral edema status every visit. Compression as ordered Provide education on venous insufficiency Treatment Activities: Non-invasive vascular studies : 02/17/2022 Therapeutic compression applied : 02/17/2022 Notes: Wound/Skin Impairment Nursing Diagnoses: Impaired tissue integrity  Knowledge deficit related to ulceration/compromised skin integrity Goals: Patient/caregiver will verbalize understanding of skin care regimen Date Initiated: 02/17/2022 Target Resolution Date: 02/23/2023 Goal Status: Active Ulcer/skin breakdown will have a volume reduction of 30% by week 4 Date Initiated: 02/17/2022 Date  Inactivated: 03/10/2022 Target Resolution Date: 03/10/2022 Unmet Reason: infection being treated, Goal Status: Unmet waiting on derm appte Ulcer/skin breakdown will have a volume reduction of 50% by week 8 Date Initiated: 03/10/2022 Date Inactivated: 04/14/2022 Target Resolution Date: 04/07/2022 DEVARI, KETTLEWELL ([id number]) [id number] Page 5 of 10 Goal Status: Unmet Unmet Reason: calcinosis Ulcer/skin breakdown will have a volume reduction of 80% by week 12 Date Initiated: 04/14/2022 Date Inactivated: 05/05/2022 Target Resolution Date: 05/05/2022 Unmet Reason: infection, chronic Goal Status: Unmet condition Interventions: Assess patient/caregiver ability to obtain necessary supplies Assess patient/caregiver ability to perform ulcer/skin care regimen upon admission and as needed Assess ulceration(s) every visit Provide education on ulcer and skin care Treatment Activities: Skin care regimen initiated : 02/17/2022 Topical wound management initiated : 02/17/2022 Notes: Electronic Signature(s) Signed: 02/18/2023 12:12:40 PM By: Zenaida Deed RN, BSN Entered By: Zenaida Deed on 02/18/2023 11:15:10 -------------------------------------------------------------------------------- Pain Assessment Details Patient Name: Date of Service: [name] 02/18/2023 11:00 A M Medical Record Number: [id number] Patient Account Number: [id number] Date of Birth/Sex: Treating RN: Dec 03, 1950 (72 y.o. [name] Primary Care Shatika Grinnell: [name] Other Clinician: Referring Aswad Wandrey: Treating Eleanor Dimichele/Extender: [name] in Treatment: 29 Active Problems Location of Pain Severity and Description of Pain Patient Has Paino Yes Site Locations Pain Location: Pain in Ulcers With Dressing Change: Yes Duration of the Pain. Constant / Intermittento Intermittent Rate the pain. Current Pain Level: 0 Worst Pain Level: 3 Least Pain Level:  0 Character of Pain Describe the Pain: Throbbing Pain Management and Medication Current Pain Management: Other: tolerable Is the Current Pain Management Adequate: Adequate How does your wound impact your activities of daily livingo Sleep: No Bathing: No Appetite: No Relationship With Others: No Bladder Continence: No Emotions: No Bowel Continence: No Work: No DENNISON, LABOMBARD ([id number]) [id number] Page 6 of 10 Toileting: No Drive: No Dressing: No Hobbies: No Electronic Signature(s) Signed: 02/18/2023 12:12:40 PM By: Zenaida Deed RN, BSN Entered By: Zenaida Deed on 02/18/2023 10:53:50 -------------------------------------------------------------------------------- Patient/Caregiver Education Details Patient Name: Date of Service: [name], [name] 7/26/2024andnbsp11:00 A M Medical Record Number: [id number] Patient Account Number: [id number] Date of Birth/Gender: Treating RN: 1950-08-03 (72 y.o. [name] Primary Care Physician: [name] Other Clinician: Referring Physician: Treating Physician/Extender: [name] in Treatment: 27 Education Assessment Education Provided To: Patient Education Topics Provided Venous: Methods: Explain/Verbal Responses: Reinforcements needed, State content correctly Wound/Skin Impairment: Methods: Explain/Verbal Responses: Reinforcements needed, State content correctly Electronic Signature(s) Signed: 02/18/2023 12:12:40 PM By: Zenaida Deed RN, BSN Entered By: Zenaida Deed on 02/18/2023 11:15:32 -------------------------------------------------------------------------------- Wound Assessment Details Patient Name: Date of Service: [name], [name] 02/18/2023 11:00 A M Medical Record Number: [id number] Patient Account Number: [id number] Date of Birth/Sex: Treating RN: 1951/07/23 (72 y.o. [name] Primary Care Ermon Sagan: [name] Other Clinician: Referring  Otoniel Myhand: Treating Myldred Raju/Extender: [name] in Treatment: 79 Wound Status Wound Number: 3 Primary Lymphedema Etiology: Wound Location: Right, Medial Lower Leg Wound Open Wounding Event: Gradually Appeared Status: Date Acquired: 12/07/2021 Comorbid Cataracts, Anemia, Lymphedema, Congestive Heart Failure, Deep Weeks Of Treatment: 53 History: Vein Thrombosis, Hypertension, Peripheral Venous Disease Clustered Wound: No Photos EFOSA, SIRCY ([id number]) [id number] Page 7 of 10 Wound Measurements Length: (cm) 9 Width: (cm) 5.8 Depth: (  cm) 0.3 Area: (cm) 40.998 Volume: (cm) 12.299 % Reduction in Area: -667.6% % Reduction in Volume: -475.8% Epithelialization: Small (1-33%) Tunneling: No Undermining: No Wound Description Classification: Full Thickness With Exposed Support Structures Wound Margin: Indistinct, nonvisible Exudate Amount: Medium Exudate Type: Serosanguineous Exudate Color: red, brown Foul Odor After Cleansing: No Slough/Fibrino Yes Wound Bed Granulation Amount: Large (67-100%) Exposed Structure Granulation Quality: Red Fascia Exposed: No Necrotic Amount: Small (1-33%) Fat Layer (Subcutaneous Tissue) Exposed: Yes Necrotic Quality: Adherent Slough Tendon Exposed: No Muscle Exposed: No Joint Exposed: No Bone Exposed: No Periwound Skin Texture Texture Color No Abnormalities Noted: No No Abnormalities Noted: No Excoriation: Yes Erythema: Yes Erythema Location: Circumferential Moisture No Abnormalities Noted: Yes Temperature / Pain Temperature: No Abnormality Treatment Notes Wound #3 (Lower Leg) Wound Laterality: Right, Medial Cleanser Soap and Water Discharge Instruction: May shower and wash wound with dial antibacterial soap and water prior to dressing change. Vashe 5.8 (oz) Discharge Instruction: Cleanse the wound with Vashe prior to applying a clean dressing using gauze sponges, not tissue or  cotton balls. Peri-Wound Care Zinc Oxide Ointment 30g tube Discharge Instruction: Apply Zinc Oxide to periwound with each dressing change Sween Lotion (Moisturizing lotion) Discharge Instruction: Apply moisturizing lotion as directed Topical Primary Dressing Maxorb Extra Ag+ Alginate Dressing, 4x4.75 (in/in) Discharge Instruction: Apply to macerated Periwound as instructed UrgoClean AG 4 X 5 (in/in) Discharge Instruction: Apply directly to wound bed. Secondary Dressing ABD Pad, 8x10 Discharge Instruction: Apply over primary dressing as directed. Zetuvit Plus 4x8 in [place] ([id number]) [id number] Page 8 of 10 Discharge Instruction: Apply over primary dressing as directed. Secured With Compression Wrap ThreePress (3 layer compression wrap) Discharge Instruction: Apply three layer compression as directed. Compression Stockings Add-Ons Electronic Signature(s) Signed: 02/18/2023 12:12:40 PM By: Zenaida Deed RN, BSN Entered By: Zenaida Deed on 02/18/2023 11:13:31 -------------------------------------------------------------------------------- Wound Assessment Details Patient Name: Date of Service: [name], [name] 02/18/2023 11:00 A M Medical Record Number: [id number] Patient Account Number: [id number] Date of Birth/Sex: Treating RN: 07-01-1951 (72 y.o. [name] Primary Care Danaly Bari: [name] Other Clinician: Referring Arlando Leisinger: Treating Tahlor Berenguer/Extender: [name] in Treatment: 29 Wound Status Wound Number: 6 Primary Calcinosis Etiology: Wound Location: Right, Anterior Lower Leg Wound Open Wounding Event: Gradually Appeared Status: Date Acquired: 05/12/2022 Comorbid Cataracts, Anemia, Lymphedema, Congestive Heart Failure, Deep Weeks Of Treatment: 40 History: Vein Thrombosis, Hypertension, Peripheral Venous Disease Clustered Wound: Yes Photos Wound Measurements Length: (cm) Width:  (cm) Depth: (cm) Clustered Quantity: Area: (cm) Volume: (cm) 1.8 % Reduction in Area: -139.4% 0.4 % Reduction in Volume: -370.8% 0.2 Epithelialization: Large (67-100%) 2 Tunneling: No 0.565 Undermining: No 0.113 Wound Description Classification: Full Thickness Without Exposed Sup Wound Margin: Distinct, outline attached Exudate Amount: Medium Exudate Type: Serous Exudate Color: amber port Structures Foul Odor After Cleansing: No Slough/Fibrino Yes Wound Bed Granulation Amount: Medium (34-66%) Exposed Structure Granulation Quality: Pink Fascia Exposed: No Necrotic Amount: Medium (34-66%) Fat Layer (Subcutaneous Tissue) ExposedZENNON, FREET ([id number]) [id number] Page 9 of 10 Necrotic Quality: Adherent Slough Tendon Exposed: No Muscle Exposed: No Joint Exposed: No Bone Exposed: No Periwound Skin Texture Texture Color No Abnormalities Noted: No No Abnormalities Noted: No Excoriation: Yes Erythema: Yes Moisture Temperature / Pain No Abnormalities Noted: No Temperature: No Abnormality Assessment Notes probes to calcium deposits Treatment Notes Wound #6 (Lower Leg) Wound Laterality: Right, Anterior Cleanser Peri-Wound Care Sween Lotion (Moisturizing lotion) Discharge Instruction: Apply moisturizing lotion as directed Topical Primary Dressing UrgoClean AG 4 X 5 (in/in) Discharge Instruction:  Apply directly to wound bed. Secondary Dressing Woven Gauze Sponge, Non-Sterile 4x4 in Discharge Instruction: Apply over primary dressing as directed. Secured With Compression Wrap ThreePress (3 layer compression wrap) Discharge Instruction: Apply three layer compression as directed. Compression Stockings Add-Ons Electronic Signature(s) Signed: 02/18/2023 12:12:40 PM By: Zenaida Deed RN, BSN Entered By: Zenaida Deed on 02/18/2023 11:14:37 -------------------------------------------------------------------------------- Vitals  Details Patient Name: Date of Service: [name] 02/18/2023 11:00 A M Medical Record Number: [id number] Patient Account Number: [id number] Date of Birth/Sex: Treating RN: 03/13/1951 (72 y.o. [name] Primary Care Tezra Mahr: [name] Other Clinician: Referring Mivaan Corbitt: Treating Coral Soler/Extender: [name] in Treatment: 56 Vital Signs Time Taken: 10:52 Temperature (F): 97.5 Height (in): 71 Pulse (bpm): 83 Weight (lbs): 267 Respiratory Rate (breaths/min): 20 Body Mass Index (BMI): 37.2 Blood Pressure (mmHg): 168/81 Reference Range: 80 - 120 mg / dl Electronic Signature(s) [name], [name] ([id number]) [id number] Page 10 of 10 Signed: 02/18/2023 12:12:40 PM By: Zenaida Deed RN, BSN Entered By: Zenaida Deed on 02/18/2023 10:53:09

## 2023-02-22 ENCOUNTER — Encounter (HOSPITAL_BASED_OUTPATIENT_CLINIC_OR_DEPARTMENT_OTHER): Payer: Medicare Other | Admitting: General Surgery

## 2023-02-22 DIAGNOSIS — I872 Venous insufficiency (chronic) (peripheral): Secondary | ICD-10-CM | POA: Diagnosis not present

## 2023-02-22 DIAGNOSIS — L942 Calcinosis cutis: Secondary | ICD-10-CM | POA: Diagnosis not present

## 2023-02-22 DIAGNOSIS — I890 Lymphedema, not elsewhere classified: Secondary | ICD-10-CM | POA: Diagnosis not present

## 2023-02-22 DIAGNOSIS — I509 Heart failure, unspecified: Secondary | ICD-10-CM | POA: Diagnosis not present

## 2023-02-22 DIAGNOSIS — I110 Hypertensive heart disease with heart failure: Secondary | ICD-10-CM | POA: Diagnosis not present

## 2023-02-22 DIAGNOSIS — L97812 Non-pressure chronic ulcer of other part of right lower leg with fat layer exposed: Secondary | ICD-10-CM | POA: Diagnosis not present

## 2023-02-22 NOTE — Progress Notes (Signed)
EHAN, COSNER ([id number]) [id number] Page 1 of 5 Visit Report for 02/22/2023 Arrival Information Details Patient Name: Date of Service: [name], [name] 02/22/2023 3:00 PM Medical Record Number: [id number] Patient Account Number: [id number] Date of Birth/Sex: Treating RN: Jun 03, 1951 (72 y.o. [name] Primary Care Bayla Mcgovern: [name] Other Clinician: Referring Kamill Fulbright: Treating Priya Matsen/Extender: [name] in Treatment: 58 Visit Information History Since Last Visit Added or deleted any medications: No Patient Arrived: Wheel Chair Any new allergies or adverse reactions: No Arrival Time: 15:33 Had a fall or experienced change in No Accompanied By: self activities of daily living that may affect Transfer Assistance: None risk of falls: Patient Identification Verified: Yes Signs or symptoms of abuse/neglect since last visito No Secondary Verification Process Completed: Yes Hospitalized since last visit: No Patient Requires Transmission-Based Precautions: No Implantable device outside of the clinic excluding No Patient Has Alerts: Yes cellular tissue based products placed in the center Patient Alerts: R ABI= N/C, TBI= .75 since last visit: L ABI =N/C, TBI = .57 Has Dressing in Place as Prescribed: Yes Has Compression in Place as Prescribed: Yes Pain Present Now: No Electronic Signature(s) Signed: 02/22/2023 4:02:14 PM By: [name] Entered By: [name] on 02/22/2023 15:33:45 -------------------------------------------------------------------------------- Compression Therapy Details Patient Name: Date of Service: [name] 02/22/2023 3:00 PM Medical Record Number: [id number] Patient Account Number: [id number] Date of Birth/Sex: Treating RN: 1950/12/11 (72 y.o. [name] Primary Care Shatori Bertucci: [name] Other Clinician: Referring Akya Fiorello: Treating Radhika Dershem/Extender: [name] in Treatment: 72 Compression Therapy Performed for Wound Assessment: Wound #3 Right,Medial Lower Leg Performed By: Clinician [name], RN Compression Type: Double Layer Electronic Signature(s) Signed: 02/22/2023 4:02:14 PM By: Gelene Mink By: [name] on 02/22/2023 15:35:23 -------------------------------------------------------------------------------- Encounter Discharge Information Details Patient Name: Date of Service: [name] 02/22/2023 3:00 PM Medical Record Number: [id number] Patient Account Number: [id number] [name], [name] ([id number]) [id number] Page 2 of 5 Date of Birth/Sex: Treating RN: 13-May-1951 (72 y.o. [name] Primary Care Fredrick Geoghegan: Other Clinician: [name] Referring Clella Mckeel: Treating Toi Stelly/Extender: [name] in Treatment: 64 Encounter Discharge Information Items Discharge Condition: Stable Ambulatory Status: Wheelchair Discharge Destination: Home Transportation: Private Auto Accompanied By: self Schedule Follow-up Appointment: Yes Clinical Summary of Care: Patient Declined Electronic Signature(s) Signed: 02/22/2023 4:02:14 PM By: Gelene Mink By: [name] on 02/22/2023 15:36:27 -------------------------------------------------------------------------------- Patient/Caregiver Education Details Patient Name: Date of Service: [name] 7/30/2024andnbsp3:00 PM Medical Record Number: [id number] Patient Account Number: [id number] Date of Birth/Gender: Treating RN: 1950-07-30 (72 y.o. [name] Primary Care Physician: [name] Other Clinician: Referring Physician: Treating Physician/Extender: [name] in Treatment: 58 Education Assessment Education Provided To: Patient Education Topics Provided Wound/Skin Impairment: Methods:  Explain/Verbal Responses: Reinforcements needed, State content correctly [employer]) Signed: 02/22/2023 4:02:14 PM By: [name] Entered By: [name] on 02/22/2023 15:36:12 -------------------------------------------------------------------------------- Wound Assessment Details Patient Name: Date of Service: [name] 02/22/2023 3:00 PM Medical Record Number: [id number] Patient Account Number: [id number] Date of Birth/Sex: Treating RN: 12-29-1950 (72 y.o. [name] Primary Care Bayan Kushnir: [name] Other Clinician: Referring Simpson Paulos: Treating Ritika Hellickson/Extender: [name] in Treatment: 54 Wound Status Wound Number: 3 Primary Lymphedema Etiology: Wound Location: Right, Medial Lower Leg Wound Open Wounding Event: Gradually Appeared Status: Date Acquired: 12/07/2021 YASH, LASSMAN ([id number]) [phone].pdf Page 3 of 5 Date Acquired: 12/07/2021 Comorbid Cataracts, Anemia, Lymphedema, Congestive Heart Failure, Deep Weeks Of Treatment: 54 History: Vein  Thrombosis, Hypertension, Peripheral Venous Disease Clustered Wound: No Wound Measurements Length: (cm) 9 Width: (cm) 5.8 Depth: (cm) 0.3 Area: (cm) 40.998 Volume: (cm) 12.299 % Reduction in Area: -667.6% % Reduction in Volume: -475.8% Epithelialization: Small (1-33%) Wound Description Classification: Full Thickness With Exposed Suppo Wound Margin: Indistinct, nonvisible Exudate Amount: Medium Exudate Type: Serosanguineous Exudate Color: red, brown rt Structures Foul Odor After Cleansing: No Slough/Fibrino Yes Wound Bed Granulation Amount: Large (67-100%) Exposed Structure Granulation Quality: Red Fascia Exposed: No Necrotic Amount: Small (1-33%) Fat Layer (Subcutaneous Tissue) Exposed: Yes Necrotic Quality: Adherent Slough Tendon Exposed: No Muscle Exposed: No Joint Exposed: No Bone Exposed: No Periwound Skin  Texture Texture Color No Abnormalities Noted: No No Abnormalities Noted: No Excoriation: Yes Erythema: Yes Erythema Location: Circumferential Moisture No Abnormalities Noted: Yes Temperature / Pain Temperature: No Abnormality Treatment Notes Wound #3 (Lower Leg) Wound Laterality: Right, Medial Cleanser Soap and Water Discharge Instruction: May shower and wash wound with dial antibacterial soap and water prior to dressing change. Vashe 5.8 (oz) Discharge Instruction: Cleanse the wound with Vashe prior to applying a clean dressing using gauze sponges, not tissue or cotton balls. Peri-Wound Care Zinc Oxide Ointment 30g tube Discharge Instruction: Apply Zinc Oxide to periwound with each dressing change Sween Lotion (Moisturizing lotion) Discharge Instruction: Apply moisturizing lotion as directed Topical Gentamicin Discharge Instruction: mixed 50:50 with mupirocin to wound bed Mupirocin Ointment Discharge Instruction: Apply Mupirocin (Bactroban) as instructed Primary Dressing Hydrofera Blue Ready Transfer Foam, 4x5 (in/in) Discharge Instruction: Apply to wound bed if urgo clean unavailable Maxorb Extra Ag+ Alginate Dressing, 4x4.75 (in/in) Discharge Instruction: Apply to macerated Periwound as instructed UrgoClean AG 4 X 5 (in/in) Discharge Instruction: Apply directly to wound bed. Secondary Dressing ABD Pad, 8x10 Discharge Instruction: Apply over primary dressing as directed. Zetuvit Plus 4x8 in Discharge Instruction: Apply over primary dressing as directed. CURTIES, SASSAMAN ([id number]) [id number] Page 4 of 5 Secured With Compression Wrap ThreePress (3 layer compression wrap) Discharge Instruction: Apply three layer compression as directed. Compression Stockings Add-Ons Electronic Signature(s) Signed: 02/22/2023 4:02:14 PM By: [name] Entered By: [name] on 02/22/2023  15:34:56 -------------------------------------------------------------------------------- Wound Assessment Details Patient Name: Date of Service: DAMYN, SHUSTERMAN 02/22/2023 3:00 PM Medical Record Number: [id number] Patient Account Number: [id number] Date of Birth/Sex: Treating RN: Mar 01, 1951 (72 y.o. [name] Primary Care Letonia Stead: [name] Other Clinician: Referring Stehanie Ekstrom: Treating Nick Stults/Extender: [name] in Treatment: 54 Wound Status Wound Number: 6 Primary Calcinosis Etiology: Wound Location: Right, Anterior Lower Leg Wound Open Wounding Event: Gradually Appeared Status: Date Acquired: 05/12/2022 Comorbid Cataracts, Anemia, Lymphedema, Congestive Heart Failure, Deep Weeks Of Treatment: 40 History: Vein Thrombosis, Hypertension, Peripheral Venous Disease Clustered Wound: Yes Wound Measurements Length: (cm) Width: (cm) Depth: (cm) Clustered Quantity: Area: (cm) Volume: (cm) 1.8 % Reduction in Area: -139.4% 0.4 % Reduction in Volume: -370.8% 0.2 Epithelialization: Large (67-100%) 2 0.565 0.113 Wound Description Classification: Full Thickness Without Exposed Sup Wound Margin: Distinct, outline attached Exudate Amount: Medium Exudate Type: Serous Exudate Color: amber port Structures Foul Odor After Cleansing: No Slough/Fibrino Yes Wound Bed Granulation Amount: Medium (34-66%) Exposed Structure Granulation Quality: Pink Fascia Exposed: No Necrotic Amount: Medium (34-66%) Fat Layer (Subcutaneous Tissue) Exposed: Yes Necrotic Quality: Adherent Slough Tendon Exposed: No Muscle Exposed: No Joint Exposed: No Bone Exposed: No Periwound Skin Texture Texture Color No Abnormalities Noted: No No Abnormalities Noted: No Excoriation: Yes Erythema: Yes Moisture Temperature / Pain No Abnormalities Noted: No Temperature: No Abnormality Treatment Notes Wound #6 (Lower Leg) Wound  Laterality: Right, Anterior JOMO, BOGDANOWICZ ([id number]) [id number] Page 5 of 5 Cleanser Peri-Wound Care Sween Lotion (Moisturizing lotion) Discharge Instruction: Apply moisturizing lotion as directed Topical Gentamicin Discharge Instruction: As directed by physician Mupirocin Ointment Discharge Instruction: Apply Mupirocin (Bactroban) as instructed Primary Dressing Maxorb Extra Ag+ Alginate Dressing, 4x4.75 (in/in) Discharge Instruction: Apply to macerated periwound as instructed UrgoClean AG 4 X 5 (in/in) Discharge Instruction: Apply directly to wound bed. Secondary Dressing Woven Gauze Sponge, Non-Sterile 4x4 in Discharge Instruction: Apply over primary dressing as directed. Secured With Compression Wrap ThreePress (3 layer compression wrap) Discharge Instruction: Apply three layer compression as directed. Compression Stockings Add-Ons Electronic Signature(s) Signed: 02/22/2023 4:02:14 PM By: [name] Entered By: [name] on 02/22/2023 15:35:07

## 2023-02-23 NOTE — Progress Notes (Signed)
LIPA, GLEW ([id number]) [id number] Page 1 of 1 Visit Report for 02/22/2023 SuperBill Details Patient Name: Date of Service: [name], [name] 02/22/2023 Medical Record Number: [id number] Patient Account Number: [id number] Date of Birth/Sex: Treating RN: Nov 28, 1950 (72 y.o. 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) I89.0 Lymphedema, not elsewhere classified I10 Essential (primary) hypertension I25.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 angina pectoris Z86.718 Personal history of other venous thrombosis and embolism Facility Procedures CPT4 Code Description Modifier Quantity [phone] (Facility Use Only) [phone] - APPLY MULTLAY COMPRS LWR RT LEG 1 ICD-10 Diagnosis Description 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ed Electronic Signature(s) Signed: 02/22/2023 4:02:14 PM By: [name] Signed: 02/23/2023 7:48:01 AM By: Duanne Guess MD FACS Entered By: [name] on 02/22/2023 15:36:39

## 2023-02-25 ENCOUNTER — Encounter (HOSPITAL_BASED_OUTPATIENT_CLINIC_OR_DEPARTMENT_OTHER): Payer: Medicare Other | Attending: General Surgery | Admitting: General Surgery

## 2023-02-25 DIAGNOSIS — L98492 Non-pressure chronic ulcer of skin of other sites with fat layer exposed: Secondary | ICD-10-CM | POA: Diagnosis not present

## 2023-02-25 DIAGNOSIS — I110 Hypertensive heart disease with heart failure: Secondary | ICD-10-CM | POA: Diagnosis not present

## 2023-02-25 DIAGNOSIS — I872 Venous insufficiency (chronic) (peripheral): Secondary | ICD-10-CM | POA: Diagnosis not present

## 2023-02-25 DIAGNOSIS — B961 Klebsiella pneumoniae [K. pneumoniae] as the cause of diseases classified elsewhere: Secondary | ICD-10-CM | POA: Diagnosis not present

## 2023-02-25 DIAGNOSIS — L97812 Non-pressure chronic ulcer of other part of right lower leg with fat layer exposed: Secondary | ICD-10-CM | POA: Insufficient documentation

## 2023-02-25 DIAGNOSIS — L97521 Non-pressure chronic ulcer of other part of left foot limited to breakdown of skin: Secondary | ICD-10-CM | POA: Diagnosis not present

## 2023-02-25 DIAGNOSIS — B9689 Other specified bacterial agents as the cause of diseases classified elsewhere: Secondary | ICD-10-CM | POA: Diagnosis not present

## 2023-02-25 DIAGNOSIS — Z86718 Personal history of other venous thrombosis and embolism: Secondary | ICD-10-CM | POA: Diagnosis not present

## 2023-02-25 DIAGNOSIS — B965 Pseudomonas (aeruginosa) (mallei) (pseudomallei) as the cause of diseases classified elsewhere: Secondary | ICD-10-CM | POA: Insufficient documentation

## 2023-02-25 DIAGNOSIS — I890 Lymphedema, not elsewhere classified: Secondary | ICD-10-CM | POA: Insufficient documentation

## 2023-02-25 DIAGNOSIS — I2510 Atherosclerotic heart disease of native coronary artery without angina pectoris: Secondary | ICD-10-CM | POA: Insufficient documentation

## 2023-02-25 DIAGNOSIS — L97821 Non-pressure chronic ulcer of other part of left lower leg limited to breakdown of skin: Secondary | ICD-10-CM | POA: Insufficient documentation

## 2023-02-25 DIAGNOSIS — I509 Heart failure, unspecified: Secondary | ICD-10-CM | POA: Insufficient documentation

## 2023-02-25 DIAGNOSIS — L942 Calcinosis cutis: Secondary | ICD-10-CM | POA: Insufficient documentation

## 2023-02-25 NOTE — Progress Notes (Signed)
[name], [name] ([id number]) [id number] Page 1 of 10 Visit Report for 02/25/2023 Arrival Information Details Patient Name: Date of Service: [name], [name] 02/25/2023 9:30 A M Medical Record Number: [id number] Patient Account Number: [id number] Date of Birth/Sex: Treating RN: May 23, 1951 (72 y.o. M) Primary Care Mysti Haley: [name] Other Clinician: Referring Sanai Frick: Treating Keniel Ralston/Extender: [name] in Treatment: 73 Visit Information History Since Last Visit All ordered tests and consults were completed: No Patient Arrived: Wheel Chair Added or deleted any medications: No Arrival Time: 09:21 Any new allergies or adverse reactions: No Accompanied By: self Had a fall or experienced change in No Transfer Assistance: None activities of daily living that may affect Patient Identification Verified: Yes risk of falls: Secondary Verification Process Completed: Yes Signs or symptoms of abuse/neglect since last visito No Patient Requires Transmission-Based Precautions: No Hospitalized since last visit: No Patient Has Alerts: Yes Implantable device outside of the clinic excluding No Patient Alerts: R ABI= N/C, TBI= .75 cellular tissue based products placed in the center L ABI =N/C, TBI = .57 since last visit: Has Dressing in Place as Prescribed: Yes Has Compression in Place as Prescribed: Yes Pain Present Now: No Electronic Signature(s) Signed: 02/25/2023 1:18:06 PM By: Zenaida Deed RN, BSN Entered By: Zenaida Deed on 02/25/2023 09:24:09 -------------------------------------------------------------------------------- Compression Therapy Details Patient Name: Date of Service: [name] 02/25/2023 9:30 A M Medical Record Number: [id number] Patient Account Number: [id number] Date of Birth/Sex: Treating RN: 20-Dec-1950 (72 y.o. [name] Primary Care Maliq Pilley: [name] Other Clinician: Referring  Hailey Stormer: Treating Teyanna Thielman/Extender: [name] in Treatment: 8 Compression Therapy Performed for Wound Assessment: Wound #3 Right,Medial Lower Leg Performed By: Clinician Zenaida Deed, RN Compression Type: Henriette Combs Post Procedure Diagnosis Same as Pre-procedure Notes coflex zinc Electronic Signature(s) Signed: 02/25/2023 1:18:06 PM By: Zenaida Deed RN, BSN Entered By: Zenaida Deed on 02/25/2023 12:19:53 [name] ([id number]) [id number] Page 2 of 10 -------------------------------------------------------------------------------- Compression Therapy Details Patient Name: Date of Service: [name], [name] 02/25/2023 9:30 A M Medical Record Number: [id number] Patient Account Number: [id number] Date of Birth/Sex: Treating RN: March 07, 1951 (72 y.o. [name] Primary Care Hykeem Ojeda: [name] Other Clinician: Referring Savir Blanke: Treating Nikira Kushnir/Extender: [name] in Treatment: 64 Compression Therapy Performed for Wound Assessment: NonWound Condition Lymphedema - Left Leg Performed By: Clinician Zenaida Deed, RN Compression Type: Three Layer Post Procedure Diagnosis Same as Pre-procedure Electronic Signature(s) Signed: 02/25/2023 1:18:06 PM By: Zenaida Deed RN, BSN Entered By: Zenaida Deed on 02/25/2023 12:22:11 -------------------------------------------------------------------------------- Encounter Discharge Information Details Patient Name: Date of Service: [name] 02/25/2023 9:30 A M Medical Record Number: [id number] Patient Account Number: [id number] Date of Birth/Sex: Treating RN: 08/30/1950 (72 y.o. [name] Primary Care Alrick Cubbage: [name] Other Clinician: Referring Nahun Kronberg: Treating Jayelle Page/Extender: [name] in Treatment: 5 Encounter Discharge Information Items Post Procedure Vitals Discharge Condition:  Stable Temperature (F): 97.9 Ambulatory Status: Wheelchair Pulse (bpm): 70 Discharge Destination: Home Respiratory Rate (breaths/min): 18 Transportation: Private Auto Blood Pressure (mmHg): 144/73 Accompanied By: self Schedule Follow-up Appointment: Yes Clinical Summary of Care: Patient Declined Electronic Signature(s) Signed: 02/25/2023 1:18:06 PM By: Zenaida Deed RN, BSN Entered By: Zenaida Deed on 02/25/2023 12:24:13 -------------------------------------------------------------------------------- Lower Extremity Assessment Details Patient Name: Date of Service: [name], [name] 02/25/2023 9:30 A M Medical Record Number: [id number] Patient Account Number: [id number] Date of Birth/Sex: Treating RN: 1951-07-26 (72 y.o. [name] Primary Care Contrell Ballentine: [name] Other Clinician: Referring Stepfon Rawles: Treating Syvanna Ciolino/Extender: [name],  Tawni Pummel, Philip Weeks in Treatment: 54 Edema Assessment Assessed: [Left: No] [Right: No] B[Left: RYK, Ambrose ([id number])] [Right: [id number] Page 3 of 10] Edema: [Left: Yes] [Right: Yes] Calf Left: Right: Point of Measurement: From Medial Instep 41.5 cm 41.8 cm Ankle Left: Right: Point of Measurement: From Medial Instep 24.5 cm 27.8 cm Vascular Assessment Pulses: Dorsalis Pedis Palpable: [Left:No] [Right:Yes] Extremity colors, hair growth, and conditions: Extremity Color: [Left:Hyperpigmented] [Right:Hyperpigmented] Hair Growth on Extremity: [Left:Yes] [Right:Yes] Temperature of Extremity: [Left:Warm < 3 seconds] [Right:Warm < 3 seconds] Electronic Signature(s) Signed: 02/25/2023 1:18:06 PM By: Zenaida Deed RN, BSN Entered By: Zenaida Deed on 02/25/2023 09:37:30 -------------------------------------------------------------------------------- Multi Wound Chart Details Patient Name: Date of Service: [name] 02/25/2023 9:30 A M Medical Record Number: [id number] Patient Account Number:  [id number] Date of Birth/Sex: Treating RN: 1950/12/01 (72 y.o. [name] Primary Care Teala Daffron: [name] Other Clinician: Referring Bayan Hedstrom: Treating Eyleen Rawlinson/Extender: [name] in Treatment: 110 Vital Signs Height(in): 71 Pulse(bpm): 70 Weight(lbs): 267 Blood Pressure(mmHg): 144/73 Body Mass Index(BMI): 37.2 Temperature(F): 97.9 Respiratory Rate(breaths/min): 20 [3:Photos:] [N/A:N/A] Right, Medial Lower Leg Right, Anterior Lower Leg N/A Wound Location: Gradually Appeared Gradually Appeared N/A Wounding Event: Lymphedema Calcinosis N/A Primary Etiology: Cataracts, Anemia, Lymphedema, Cataracts, Anemia, Lymphedema, N/A Comorbid History: Congestive Heart Failure, Deep Vein Congestive Heart Failure, Deep Vein Thrombosis, Hypertension, Peripheral Thrombosis, Hypertension, Peripheral Venous Disease Venous Disease 12/07/2021 05/12/2022 N/A Date Acquired: 42 41 N/A Weeks of Treatment: Open Open N/A Wound Status: No No N/A Wound Recurrence: No Yes N/A Clustered Wound: N/A 2 N/A Clustered Quantity: 9x6x0.4 1.5x0.4x0.3 N/A Measurements L x W x D (cm) [name], [name] ([id number]) [id number] Page 4 of 10 42.412 0.471 N/A A (cm) : rea 16.965 0.141 N/A Volume (cm) : -694.10% -99.60% N/A % Reduction in Area: -694.20% -487.50% N/A % Reduction in Volume: Full Thickness With Exposed Support Full Thickness Without Exposed N/A Classification: Structures Support Structures Large Medium N/A Exudate A mount: Serous Serous N/A Exudate Type: amber amber N/A Exudate Color: Indistinct, nonvisible Distinct, outline attached N/A Wound Margin: Large (67-100%) Small (1-33%) N/A Granulation A mount: Red Pink N/A Granulation Quality: Small (1-33%) Small (1-33%) N/A Necrotic A mount: Fat Layer (Subcutaneous Tissue): Yes Fat Layer (Subcutaneous Tissue): Yes N/A Exposed Structures: Fascia: No Fascia: No Tendon:  No Tendon: No Muscle: No Muscle: No Joint: No Joint: No Bone: No Bone: No Small (1-33%) Large (67-100%) N/A Epithelialization: Debridement - Selective/Open Wound N/A N/A Debridement: Pre-procedure Verification/Time Out 09:50 N/A N/A Taken: Lidocaine 4% Topical Solution N/A N/A Pain Control: Slough N/A N/A Tissue Debrided: Non-Viable Tissue N/A N/A Level: 12.72 N/A N/A Debridement A (sq cm): rea Curette N/A N/A Instrument: Minimum N/A N/A Bleeding: Pressure N/A N/A Hemostasis A chieved: 1 N/A N/A Procedural Pain: 0 N/A N/A Post Procedural Pain: Procedure was tolerated well N/A N/A Debridement Treatment Response: 9x6x0.4 N/A N/A Post Debridement Measurements L x W x D (cm) 16.965 N/A N/A Post Debridement Volume: (cm) Excoriation: Yes Excoriation: Yes N/A Periwound Skin Texture: No Abnormalities Noted N/A Periwound Skin Moisture: Erythema: Yes Erythema: Yes N/A Periwound Skin Color: Circumferential N/A N/A Erythema Location: No Abnormality No Abnormality N/A Temperature: N/A probes to calcium deposits N/A Assessment Notes: Compression Therapy N/A N/A Procedures Performed: Debridement Treatment Notes Electronic Signature(s) Signed: 02/25/2023 10:03:44 AM By: Duanne Guess MD FACS Signed: 02/25/2023 1:18:06 PM By: Zenaida Deed RN, BSN Entered By: Duanne Guess on 02/25/2023 10:03:44 -------------------------------------------------------------------------------- Multi-Disciplinary Care Plan Details Patient Name: Date of Service: [name] 02/25/2023 9:30 A  M Medical Record Number: [id number] Patient Account Number: [id number] Date of Birth/Sex: Treating RN: 1950/12/05 (72 y.o. [name] Primary Care Junah Yam: [name] Other Clinician: Referring Graelyn Bihl: Treating Tayen Narang/Extender: [name] in Treatment: 34 Multidisciplinary Care Plan reviewed with physician Active Inactive Venous Leg  Ulcer Nursing Diagnoses: Knowledge deficit related to disease process and management Potential for venous Insuffiency (use before diagnosis confirmed) ZYSHONNE, MALECHA ([id number]) [id number] Page 5 of 10 Goals: Patient will maintain optimal edema control Date Initiated: 02/17/2022 Target Resolution Date: 03/23/2023 Goal Status: Active Interventions: Assess peripheral edema status every visit. Compression as ordered Provide education on venous insufficiency Treatment Activities: Non-invasive vascular studies : 02/17/2022 Therapeutic compression applied : 02/17/2022 Notes: Wound/Skin Impairment Nursing Diagnoses: Impaired tissue integrity Knowledge deficit related to ulceration/compromised skin integrity Goals: Patient/caregiver will verbalize understanding of skin care regimen Date Initiated: 02/17/2022 Target Resolution Date: 03/23/2023 Goal Status: Active Ulcer/skin breakdown will have a volume reduction of 30% by week 4 Date Initiated: 02/17/2022 Date Inactivated: 03/10/2022 Target Resolution Date: 03/10/2022 Unmet Reason: infection being treated, Goal Status: Unmet waiting on derm appte Ulcer/skin breakdown will have a volume reduction of 50% by week 8 Date Initiated: 03/10/2022 Date Inactivated: 04/14/2022 Target Resolution Date: 04/07/2022 Goal Status: Unmet Unmet Reason: calcinosis Ulcer/skin breakdown will have a volume reduction of 80% by week 12 Date Initiated: 04/14/2022 Date Inactivated: 05/05/2022 Target Resolution Date: 05/05/2022 Unmet Reason: infection, chronic Goal Status: Unmet condition Interventions: Assess patient/caregiver ability to obtain necessary supplies Assess patient/caregiver ability to perform ulcer/skin care regimen upon admission and as needed Assess ulceration(s) every visit Provide education on ulcer and skin care Treatment Activities: Skin care regimen initiated : 02/17/2022 Topical wound management initiated :  02/17/2022 Notes: Electronic Signature(s) Signed: 02/25/2023 1:18:06 PM By: Zenaida Deed RN, BSN Entered By: Zenaida Deed on 02/25/2023 09:07:15 -------------------------------------------------------------------------------- Pain Assessment Details Patient Name: Date of Service: [name] 02/25/2023 9:30 A M Medical Record Number: [id number] Patient Account Number: [id number] Date of Birth/Sex: Treating RN: [birth year] (72 y.o. M) Primary Care Arisha Gervais: [name] Other Clinician: Referring Kasyn Rolph: Treating Ledon Weihe/Extender: [name] in Treatment: 79 Active Problems Location of Pain Severity and Description of Pain Patient Has Paino No [name], [name] ([id number]) [id number] Page 6 of 10 Patient Has Paino No Site Locations Rate the pain. Current Pain Level: 0 Pain Management and Medication Current Pain Management: Electronic Signature(s) Signed: 02/25/2023 1:18:06 PM By: Zenaida Deed RN, BSN Entered By: Zenaida Deed on 02/25/2023 09:24:34 -------------------------------------------------------------------------------- Patient/Caregiver Education Details Patient Name: Date of Service: Santarelli, [name] 8/2/2024andnbsp9:30 A M Medical Record Number: [id number] Patient Account Number: [id number] Date of Birth/Gender: Treating RN: [birth year] (72 y.o. [name] Primary Care Physician: [name] Other Clinician: Referring Physician: Treating Physician/Extender: [name] in Treatment: 64 Education Assessment Education Provided To: Patient Education Topics Provided Venous: Methods: Explain/Verbal Responses: Reinforcements needed, State content correctly Wound/Skin Impairment: Methods: Explain/Verbal Responses: Reinforcements needed, State content correctly Electronic Signature(s) Signed: 02/25/2023 1:18:06 PM By: Zenaida Deed RN, BSN Entered By: Zenaida Deed on  02/25/2023 09:07:39 [name] ([id number]) [id number] Page 7 of 10 -------------------------------------------------------------------------------- Wound Assessment Details Patient Name: Date of Service: [name], [name] 02/25/2023 9:30 A M Medical Record Number: [id number] Patient Account Number: [id number] Date of Birth/Sex: Treating RN: 21-Apr-1951 (72 y.o. [name] Primary Care Atleigh Gruen: [name] Other Clinician: Referring Kayn Haymore: Treating Jaysha Lasure/Extender: [name] in Treatment: 54 Wound Status Wound Number: 3 Primary Lymphedema Etiology: Wound Location: Right, Medial Lower  Leg Wound Open Wounding Event: Gradually Appeared Status: Date Acquired: 12/07/2021 Comorbid Cataracts, Anemia, Lymphedema, Congestive Heart Failure, Deep Weeks Of Treatment: 54 History: Vein Thrombosis, Hypertension, Peripheral Venous Disease Clustered Wound: No Photos Wound Measurements Length: (cm) 9 Width: (cm) 6 Depth: (cm) 0.4 Area: (cm) 42.412 Volume: (cm) 16.965 % Reduction in Area: -694.1% % Reduction in Volume: -694.2% Epithelialization: Small (1-33%) Tunneling: No Undermining: No Wound Description Classification: Full Thickness With Exposed Support Structures Wound Margin: Indistinct, nonvisible Exudate Amount: Large Exudate Type: Serous Exudate Color: amber Foul Odor After Cleansing: No Slough/Fibrino Yes Wound Bed Granulation Amount: Large (67-100%) Exposed Structure Granulation Quality: Red Fascia Exposed: No Necrotic Amount: Small (1-33%) Fat Layer (Subcutaneous Tissue) Exposed: Yes Necrotic Quality: Adherent Slough Tendon Exposed: No Muscle Exposed: No Joint Exposed: No Bone Exposed: No Periwound Skin Texture Texture Color No Abnormalities Noted: No No Abnormalities Noted: No Excoriation: Yes Erythema: Yes Erythema Location: Circumferential Moisture No Abnormalities Noted: Yes Temperature /  Pain Temperature: No Abnormality Treatment Notes Wound #3 (Lower Leg) Wound Laterality: Right, Medial Cleanser Soap and Water Discharge Instruction: May shower and wash wound with dial antibacterial soap and water prior to dressing change. Vashe 5.8 (oz) Discharge Instruction: Cleanse the wound with Vashe prior to applying a clean dressing using gauze sponges, not tissue or cotton balls. VALGENE, [name] ([id number]) [id number] Page 8 of 10 Peri-Wound Care Zinc Oxide Ointment 30g tube Discharge Instruction: Apply Zinc Oxide to periwound with each dressing change Sween Lotion (Moisturizing lotion) Discharge Instruction: Apply moisturizing lotion as directed Topical Gentamicin Discharge Instruction: mixed 50:50 with mupirocin to wound bed Mupirocin Ointment Discharge Instruction: Apply Mupirocin (Bactroban) as instructed Primary Dressing UrgoClean AG 4 X 5 (in/in) Discharge Instruction: Apply directly to wound bed on proximal part of wound area Secondary Dressing ABD Pad, 8x10 Discharge Instruction: Apply over primary dressing as directed. Zetuvit Plus 4x8 in Discharge Instruction: Apply over primary dressing as directed. Secured With Compression Wrap CoFlex Zinc [employer], 4 x 6 (in/yd) Discharge Instruction: Apply Coflex [employer]inc [employer] as directed. Compression Stockings Add-Ons Electronic Signature(s) Signed: 02/25/2023 1:18:06 PM By: Zenaida Deed RN, BSN Entered By: Zenaida Deed on 02/25/2023 09:44:12 -------------------------------------------------------------------------------- Wound Assessment Details Patient Name: Date of Service: [name], [name] 02/25/2023 9:30 A M Medical Record Number: [id number] Patient Account Number: [id number] Date of Birth/Sex: Treating RN: [birth year] (72 y.o. [name] Primary Care Carissa Musick: [name] Other Clinician: Referring Wenceslaus Gist: Treating Jen Benedict/Extender: [name]  in Treatment: 54 Wound Status Wound Number: 6 Primary Calcinosis Etiology: Wound Location: Right, Anterior Lower Leg Wound Open Wounding Event: Gradually Appeared Status: Date Acquired: 05/12/2022 Comorbid Cataracts, Anemia, Lymphedema, Congestive Heart Failure, Deep Weeks Of Treatment: 41 History: Vein Thrombosis, Hypertension, Peripheral Venous Disease Clustered Wound: Yes Photos [name], [name] ([id number]) [id number] Page 9 of 10 Wound Measurements Length: (cm) Width: (cm) Depth: (cm) Clustered Quantity: Area: (cm) Volume: (cm) 1.5 % Reduction in Area: -99.6% 0.4 % Reduction in Volume: -487.5% 0.3 Epithelialization: Large (67-100%) 2 Tunneling: No 0.471 Undermining: No 0.141 Wound Description Classification: Full Thickness Without Exposed Sup Wound Margin: Distinct, outline attached Exudate Amount: Medium Exudate Type: Serous Exudate Color: amber port Structures Foul Odor After Cleansing: No Slough/Fibrino Yes Wound Bed Granulation Amount: Small (1-33%) Exposed Structure Granulation Quality: Pink Fascia Exposed: No Necrotic Amount: Small (1-33%) Fat Layer (Subcutaneous Tissue) Exposed: Yes Necrotic Quality: Adherent Slough Tendon Exposed: No Muscle Exposed: No Joint Exposed: No Bone Exposed: No Periwound Skin Texture Texture Color No Abnormalities Noted: No No Abnormalities Noted: No  Excoriation: Yes Erythema: Yes Moisture Temperature / Pain No Abnormalities Noted: No Temperature: No Abnormality Assessment Notes probes to calcium deposits Treatment Notes Wound #6 (Lower Leg) Wound Laterality: Right, Anterior Cleanser Peri-Wound Care Sween Lotion (Moisturizing lotion) Discharge Instruction: Apply moisturizing lotion as directed Topical Gentamicin Discharge Instruction: As directed by physician Mupirocin Ointment Discharge Instruction: Apply Mupirocin (Bactroban) as instructed Primary Dressing UrgoClean AG 4 X 5  (in/in) Discharge Instruction: Apply directly to wound bed. Secondary Dressing Woven Gauze Sponge, Non-Sterile 4x4 in Discharge Instruction: Apply over primary dressing as directed. Secured With Compression Wrap CoFlex Zinc [employer], 4 x 6 (in/yd) Discharge Instruction: Apply Coflex Zinc [employer] as directed. Compression Stockings Add-Ons Electronic Signature(s) Signed: 02/25/2023 1:18:06 PM By: Zenaida Deed RN, BSN Entered By: Zenaida Deed on 02/25/2023 09:45:11 [name] ([id number]) [id number] Page 10 of 10 -------------------------------------------------------------------------------- Vitals Details Patient Name: Date of Service: DONNELL, WION 02/25/2023 9:30 A M Medical Record Number: [id number] Patient Account Number: [id number] Date of Birth/Sex: Treating RN: 1950-09-25 (72 y.o. M) Primary Care Aella Ronda: [name] Other Clinician: Referring Tavon Magnussen: Treating Aundria Bitterman/Extender: [name] in Treatment: 55 Vital Signs Time Taken: 09:21 Temperature (F): 97.9 Height (in): 71 Pulse (bpm): 70 Weight (lbs): 267 Respiratory Rate (breaths/min): 20 Body Mass Index (BMI): 37.2 Blood Pressure (mmHg): 144/73 Reference Range: 80 - 120 mg / dl Electronic Signature(s) Signed: 02/25/2023 1:18:06 PM By: Zenaida Deed RN, BSN Entered By: Zenaida Deed on 02/25/2023 09:24:27

## 2023-02-28 NOTE — Progress Notes (Signed)
[name], [name] ([id number]) [id number] Page 1 of 15 Visit Report for 02/25/2023 Chief Complaint Document Details Patient Name: Date of Service: [name], [name] 02/25/2023 9:30 A M Medical Record Number: [id number] Patient Account Number: [id number] Date of Birth/Sex: Treating RN: 04-30-1951 (72 y.o. [name] Primary Care Provider: Nicoletta [name] Other Clinician: Referring Provider: Treating Provider/Extender: [name] in Treatment: 83 Information Obtained from: Patient Chief Complaint Patient seen for complaints of Non-Healing Wounds. Electronic Signature(s) Signed: 02/25/2023 10:04:31 AM By: [name] Guess MD FACS Entered By: [name] on 02/25/2023 10:04:31 -------------------------------------------------------------------------------- Debridement Details Patient Name: Date of Service: [name] 02/25/2023 9:30 A M Medical Record Number: [id number] Patient Account Number: [id number] Date of Birth/Sex: Treating RN: 12-01-1950 (71 y.o. [name] Primary Care Provider: Nicoletta [name] Other Clinician: Referring Provider: Treating Provider/Extender: [name] in Treatment: 54 Debridement Performed for Assessment: Wound #3 Right,Medial Lower Leg Performed By: Physician [name] Guess, MD Debridement Type: Debridement Level of Consciousness (Pre-procedure): Awake and Alert Pre-procedure Verification/Time Out Yes - 09:50 Taken: Start Time: 09:52 Pain Control: Lidocaine 4% T opical Solution Percent of Wound Bed Debrided: 30% T Area Debrided (cm): otal 12.72 Tissue and other material debrided: Non-Viable, Slough, Slough Level: Non-Viable Tissue Debridement Description: Selective/Open Wound Instrument: Curette Bleeding: Minimum Hemostasis Achieved: Pressure Procedural Pain: 1 Post Procedural Pain: 0 Response to Treatment: Procedure was tolerated well Level of  Consciousness (Post- Awake and Alert procedure): Post Debridement Measurements of Total Wound Length: (cm) 9 Width: (cm) 6 Depth: (cm) 0.4 Volume: (cm) 16.965 Character of Wound/Ulcer Post Debridement: Stable Post Procedure Diagnosis [name], [name] ([id number]) [id number] Page 2 of 15 Same as Pre-procedure Notes Scribed for Dr. [name] by [name] Deed, RN Electronic Signature(s) Signed: 02/25/2023 10:22:54 AM By: [name] Guess MD FACS Signed: 02/25/2023 1:18:06 PM By: [name] Deed RN, BSN Entered By: [name] on 02/25/2023 09:56:10 -------------------------------------------------------------------------------- HPI Details Patient Name: Date of Service: [name] 02/25/2023 9:30 A M Medical Record Number: [id number] Patient Account Number: [id number] Date of Birth/Sex: Treating RN: 12/19/1950 (72 y.o. [name] Primary Care Provider: Nicoletta [name] Other Clinician: Referring Provider: Treating Provider/Extender: [name] in Treatment: 61 History of Present Illness HPI Description: ADMISSION 02/09/2022 This is a 72 year old man who recently moved to [place] from [place]. He has a history of of coronary artery disease and prior left popliteal vein DVT . He is not diabetic and does not smoke. 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 He has had multiple spinal surgeries and has limited mobility. He primarily uses a motorized wheelchair. He has had lymphedema for many years and does have lymphedema pumps although he says he does not use them frequently. He has wounds on his bilateral lower extremities. He was receiving care in [place] for these prior to his move. They were apparently packing his wounds with Betadine soaked gauze. He has worn compression wraps in the past but not recently. He did say that they helped tremendously. In clinic today, his right ABI is noncompressible but has  good signals; the left ABI was 1.17. No formal vascular studies have been performed. On his left dorsal foot, there is a circular lesion that exposes the fat layer. There is fairly heavy slough accumulation within the wound, but no significant odor. On his right medial calf, he has multiple pinholes that have slough buildup in them and probe for about 2 to 4 mm in depth. They are draining clear fluid.  On his right lateral midfoot, he has ulceration consistent with venous stasis. It is geographic and has slough accumulation. 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 02/17/2022: All of the wounds are little bit larger today and have more slough accumulation. Today, the medial calf openings are larger and probe to something that feels calcified. There is odor coming from his wounds. His vascular studies are scheduled for August 9 and 10. 02-24-2022 upon evaluation today patient presents for follow-up concerning his ongoing issues here with his lower extremities. With that being said he does have significant problems with what appears to be cellulitis unfortunately the PCR culture that was sent last week got crushed by UPS in transit. With that being said we are going to reobtain a culture today although I am actually to do it on the right medial leg as this seems to be the most inflamed area today where there is some questionable drainage as well. I am going to remove some of the calcium deposits which are loosening obtain a deeper culture from this area. Fortunately I do not see any evidence of active infection systemically which is good news but locally I think we are having a bigger issue here. He does have his appointment next Thursday with vascular. Patient has also been referred to [facility] for further evaluation and treatment of the calcinosis for possible sulfate injections. 03/04/2022: The culture that was  performed last week grew out multiple species including Klebsiella, Morganella, and Pseudomonas aeruginosa. He had been prescribed Bactrim but this was not adequate to cover all of the species and levofloxacin was recommended. He completed the Bactrim but did not pick up the levofloxacin and begin taking it until yesterday. 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 He had arterial studies performed today. He does have evidence of peripheral arterial disease. Results copied here: +-------+-----------+-----------+------------+------------+ ABI/TBIT oday's ABIT oday's TBIPrevious ABIPrevious TBI +-------+-----------+-----------+------------+------------+ Right [place] 0.75    +-------+-----------+-----------+------------+------------+ Left [place] 0.57    +-------+-----------+-----------+------------+------------+ Arterial wall calcification precludes accurate ankle pressures and ABIs. Summary: Right: Resting right ankle-brachial index indicates noncompressible right lower extremity arteries. The right toe-brachial index is normal. Left: Resting left ankle-brachial index indicates noncompressible left lower extremity arteries. The left toe-brachial index is abnormal. He continues to have further tissue breakdown at the right medial calf and there is ample slough accumulation along with necrotic subcutaneous tissue. The left dorsal foot wound has built up slough, as well. The right medial foot wound is nearly closed with just a light layer of eschar over the surface. The right dorsal [name], [name] ([id number]) [id number] Page 3 of 15 lateral foot wound has also accumulated a fair amount of slough and remains quite tender. 03/10/2022: He has been taking the prescribed levofloxacin and has about 3 days  left of this. The erythema and induration on his right leg has improved. He continues to experience further breakdown of the right medial calf wound. There is extensive slough and debris accumulation there. There is heavy slough on his left dorsal foot wound, but no odor or purulent drainage. The right medial foot wound appears to be closed. The right dorsal lateral foot wound also has a fair amount of slough but it is less tender than at our last visit. He still does not have an appointment  with dermatology. 03/22/2022: The right anterior tibial wound has closed. The right lateral foot wound is nearly closed with just a little bit of slough. The left dorsal foot wound is smaller and continues to have some slough buildup but less so than at prior visits. There is good granulation tissue underlying the slough. Unfortunately the right medial calf wound continues to deteriorate. It has a foul odor today and a lot of necrotic tissue, the surrounding skin is also erythematous and moist. 03/30/2022: The right lateral foot wound continues to contract and just has a little bit of slough and eschar present. The left dorsal foot wound is also smaller with slough overlying good granulation tissue. The right medial calf wound actually looks quite a bit better today; there is no odor and there is much less necrotic tissue although some remains present. We have been using topical gentamicin and he has been taking oral Augmentin. 04/07/2022: The patient saw dermatology at [facility] last week. Unfortunately, it appears that they did not read any of the documentation that was provided. 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 Overall, it was a highly  unsatisfactory consultation. In the meantime, however, we were able to identify compounding pharmacy who could create a topical sodium thiosulfate ointment. The patient has that with him today. Both of the right foot wounds are now closed. The left dorsal foot wound has contracted but still has a layer of slough on the surface. The medial calf wounds on the right all look a little bit better. They still have heavy slough along with the chunks of calcium. Everything is stained purple. 04/14/2022: The wound on his dorsal left foot is a little bit smaller again today. There is still slough accumulation on the surface. The medial calf wounds have quite a bit less slough accumulation today. His right leg, however, is warm and erythematous, concerning for cellulitis. 04/14/2022: He completed his course of Augmentin yesterday. The medial calf wound sites are much cleaner today and shallower. There is less fragmented calcium in the wounds. He has a lot of lymphedema fluid-like drainage from these wounds, but no purulent discharge or malodor. The wound on his dorsal left foot has also contracted and is shallower. There is a layer of slough over healthy granulation tissue. 05/05/2022: The left dorsal foot wound is smaller today with less slough and more granulation tissue. The right medial calf wounds are shallower, but have extensive slough and some fat necrosis. The drainage has diminished considerably, however. 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 05/12/2022: The left dorsal foot wound continues to contract and fill with granulation tissue. There is slough on the wound surface. The right medial calf wounds also continue to fill with healthy tissue, but there is remaining slough and fat necrosis present. He had a significant increase in his drainage this week and it was a bright  yellow-green color. He also had a new wound open over his right anterior tibial surface associated with the spike of cutaneous calcium. The leg is more red, as well, concerning for infection. 05/19/2022: His culture returned positive for Pseudomonas. He is currently taking a course of  oral levofloxacin. We have also been using topical gentamicin mixed in with his sodium thiosulfate. All of the wounds look better this week. 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 There is slough and nonviable subcutaneous tissue still present. The new anterior tibial wound just has a light layer of slough. The dorsal foot wound also has some slough present and appears a little dry today. 05/26/2022: The right medial leg wound continues to improve. It is feeling with granulation tissue, but still accumulates a fairly thick layer of slough on the surface. The more recent anterior tibial wound has remained quite small, but also has some slough on the surface. The left dorsal foot wound has contracted somewhat and has perimeter epithelialization. He completed his oral levofloxacin. 06/02/2022: The left dorsal foot wound continues to improve significantly. There is just a little slough on the wound surface. The right medial leg wound had black drainage, but it looks like silver alginate was applied last week and I theorized that silver sulfide was created with a combination of the silver alginate and the sodium thiosulfate. It did, however, have a bit of an odor to it and extensive slough accumulation. The small anterior tibial wound also had a bit of slough and nonviable subcutaneous tissue present. The skin of his leg was rather macerated, as well. 06/09/2022: The left dorsal foot wound is smaller again this week with just a light layer of slough on the surface. The right medial leg wound looks substantially better. The leg and periwound skin are no longer red and macerated. There is good  granulation tissue forming with less slough and nonviable tissue. The anterior tibial wound just has a small amount of slough accumulation. 06/16/2022: The left dorsal foot wound continues to contract. His wrap slipped a little bit, however, and his foot is more swollen. The right medial leg wound continues to improve. The underlying tissue is more vital-appearing and there is less slough. He does have substantial drainage. The small anterior tibial wound has a bit of slough accumulation. 06/23/2022: The left dorsal foot wound is about half the size as it was last week. There is just some light slough on the surface and some eschar buildup around the edges. The right anterior leg wound is small with a little slough on the surface. Unfortunately, the right medial leg wound deteriorated fairly substantially. It is malodorous with gray/green/black discoloration. 06/30/2022: The left dorsal foot wound continues to contract. It is nearly closed with just a light layer of slough present. The right anterior leg wound is about the same size and has a small amount of slough on the surface. The right medial leg wound looks a lot better than it did last week. The odor has abated and the tissue is less pulpy and necrotic-looking. There is still a fairly thick layer of slough present. 12/13; the left dorsal foot wound looks very healthy. There was no need to change the dressing here and no debridement. He has a small area on the right anterior lower leg apparently had not changed much in size. The area on the medial leg is the most problematic area this is large with some depth and a completely nonviable surface today. 07/14/2022: The left dorsal foot wound is smaller today with just a little slough and eschar present. The right anterior lower leg wound is about the same without any significant change. The medial leg wound has a black and green surface and is malodorous. No purulent drainage and  the periwound skin  is intact. Edema control is suboptimal. 07/21/2022: The dorsal foot wound continues to contract and is nearly closed with just a little bit of slough and eschar on the surface. The right anterior lower leg wound is shallower today but otherwise the dimensions are unchanged. The medial leg wound looks significantly better although it still has a nonviable surface; the odor and black/green surface has resolved. His culture came back with multiple species sensitive to Augmentin, which was prescribed and a very low level of Pseudomonas, which should be handled by the topical gentamicin we have been using. 07/28/2022: The dorsal foot wound is down to just a very tiny opening with a little eschar on the surface. The right anterior tibial wound is about the same with some slough buildup. The right medial leg wound looks quite a bit better this week. There is thick rubbery slough on the surface, but the odor and drainage has improved significantly. 08/04/2022: The dorsal foot wound is almost healed, but the periwound skin has broken down. This appears to be secondary to moisture and adhesive from the absorbent pad. The right anterior tibial wound is shallower. The right medial leg wound continues to improve. It is smaller, still with rubbery slough on the surface. No malodor or purulent drainage. 08/11/2022: The dorsal foot wound is about the same size. It is a little bit dry with some slough accumulation. The right anterior tibial wound is also unchanged. The right medial leg wound is filling in further with good granulation tissue. Still with some slough buildup on the surface. NIMIT, MELNIKOV ([id number]) [id number] Page 4 of 15 08/18/2022: His left leg is bright red, hot, and swollen with cellulitis. The dorsal foot wound actually looks quite good and is superficial with just a little bit of slough accumulation. The right anterior tibial wound is unchanged. The right medial leg wound  has filled in with good granulation tissue but has copious amounts of drainage. 08/25/2022: His cellulitis has responded nicely to the oral antibiotics. His dorsal foot wound is smaller but has a fair amount of slough buildup. The right anterior tibial wound remains stable and unchanged. The right medial leg wound has more granulation tissue under a layer of slough, but continues to have copious drainage. The drainage is actually causing skin irritation and threatens to result in breakdown. 09/01/2022: He continues to have profuse drainage from his wounds which is resulting in tissue maceration on both the dorsum of his left foot and the periwound distal to his medial leg wound. The dorsal foot wound is nearly healed, despite this. The medial right leg wound is filling in with good granulation tissue. We are working on getting home health assistance to change his dressings more frequently to try and control the drainage better. 09/08/2022: We have had success in controlling his drainage. The tissue maceration has improved significantly and his swelling has come down quite markedly. The medial right leg wound is much more superficial and has substantially less nonviable tissue present. The anterior tibial wound is a little bit larger, however with some nonviable tissue present. 09/15/2022: The dorsal foot wound is nearly closed. The anterior tibial wound measures slightly larger today. The medial right leg wound continues to improve quite dramatically with less nonviable tissue and more robust-looking granulation. Edema control is significantly better. 09/22/2022: The left dorsal foot wound remains open, albeit quite tiny. The anterior tibial wound is a little bit larger again today. The medial right leg wound continues to improve with  an increase in robust and healthy granulation tissue and less accumulation of nonviable tissue. 09/29/2022: His home health providers wrapped his left leg extremely tightly and  he ended up having to cut the wrap off of his foot. This resulted in his foot being very swollen and that the dorsal foot wound is a little bit larger today. The anterior tibial wound is about the same size with a little slough on the surface. The medial right leg wound continues to fill in. There are very few areas with any significant depth. There is still slough accumulation. 3/13; Patient has a left dorsal foot wound along with a right anterior tibial wound and right medial wound. We are using silver alginate to the left dorsal foot wound, Santyl and sodium thiosulfate compound ointment to the right lower extremity wounds under 4 layer compression. Patient has no issues or complaints today. 10/13/2022: The left dorsal foot wound is a little bit bigger today, but his foot is also a little bit more swollen. The right medial leg wound has filled in more and has substantially less depth. There is still some nonviable tissue present. The small anterior lower leg wound looks about the same. 10/20/2022: The left dorsal foot wound is epithelializing. There does not appear to be much open at this site. There is some eschar around the edges. The right medial leg wound continues to fill in with granulation tissue. The small anterior lower leg wound is a little bit bigger and there is a chunk of calcium protruding. He continues to have fairly copious drainage from the right leg. 10/27/2022: His home health agency failed to show up at all this past week and as a result, the excessive drainage from his right leg wound has resulted in tissue breakdown. His leg is red and irritated. The wounds themselves look about the same. The wound on his left dorsal foot is nearly closed underneath some eschar. 11/03/2022: We continue to have difficulty with his home health agency. He continues to have significant drainage from his right leg wound which has resulted in even further tissue breakdown. He also was not taking his  Lasix as prescribed and I think much of the drainage has been his lymphedema fluid choosing the path of least resistance. The wound on his left dorsal foot is closed, but in the process of cleaning the site, dry skin was pulled off and he has a new small superficial wound just adjacent to where the dorsal foot wound was. 11/12/2022: His left dorsal foot is completely healed. The right medial leg continues to deteriorate. He is not taking his Lasix as prescribed, nor is he using his lymphedema pumps at all. As a result, he has massive amounts of drainage saturating his dressings and causing further tissue breakdown on his medial leg. Today, his dressings had a foul odor, as well. 11/19/2022: His right medial leg looks even worse today. There is more erythema and moisture related tissue breakdown. The 2 anterior wounds look a little bit better. The culture that I took last week did produce Pseudomonas and he is currently taking levofloxacin for this. 11/26/2022: There has been significant improvement to his right medial leg. It is far less raw and many of the superficial open areas have epithelialized. He continues to cut the foot portion of his wrap and as result his foot is bulbous and swollen compared to the rest of his leg, where edema control is good. The smaller of the 2 anterior tibial wound is closed, while  the larger is contracting somewhat with a bit of slough at the base. 12/03/2022: His leg continues to demonstrate improvement. There has been epithelialization of much of the denuded area. He has a new small anterior tibial wound that has opened around a nidus of calcium. Edema control is improved. 12/10/2022: Continued improvement in the periwound skin. The anterior tibial wounds are about the same, but the medial leg wound has improved further. There is less nonviable tissue present. 12/17/2022: Between his visit on Tuesday with the nurse and today, he has had significantly more drainage which has  resulted in some periwound tissue maceration. I suspect this correlates somewhat with his diuretic use, as he says he takes his diuretic religiously on Saturday, Sunday, and Monday and then does not take it after Tuesday due to being out and about and not being close to easy restroom access. I suspect the excess fluid is just simply draining out of his leg. 12/21/2022: This was supposed to be a nurse visit, but his leg has broken down so significantly, that we have converted to a wound care visit. He did take his diuretics over the weekend, but despite this, he has had copious drainage and all of his tissues have broken down substantially from the right medial leg ulcer all the way down to his ankle and even wrapping posterior to this. There is blue-green staining on his dressing, suggesting ongoing presence of Pseudomonas aeruginosa. 12/24/2022: The wound already looks better than it did 2 days ago. He has been taking ciprofloxacin and the Urgo clean dressing we applied did a much better job of wicking the drainage away from his leg. There is slough on the entire surface of the medial leg wound, but the erythema and induration has improved in the past couple of days. 12/31/2022: Continued improvement of the wound. He says that the pain has basically abated since being on ciprofloxacin. Still with fairly heavy drainage and slough accumulation. 01/07/2023: Overall continued improvement. Still with heavy slough buildup, but less drainage. 01/14/2023: There is a little bit of improvement. The skin is still looking a bit excoriated. He has a few more days left of ciprofloxacin. 01/21/2023: There has been significant improvement. The periwound skin is in much better condition. The ulcers are shallower and actually have some good granulation tissue present under some slough. 01/28/2023: The wound continues to improve. All of the open areas measured smaller today. There is slough accumulation on the surfaces. His  drainage has decreased markedly. 02/04/2023: Continued improvement in the wound. Drainage remains significantly diminished. Slough accumulation is present but to a lesser extent. [name], DEMO ([id number]) [id number] Page 5 of 15 skin continues to improve. Edema control is good. 02/18/2023: The heavy drainage persists, although it is was not as significant today as it was at his nurse visit on Tuesday. The drainage is yellow and does not have the blue-green discoloration suggestive of Pseudomonas. The actual wounds on his leg are getting smaller, but the periwound skin is quite excoriated. 02/25/2023: His periwound skin is quite excoriated and he has a lot of clear yellow drainage. The main wounds are filling in further and epithelializing. Electronic Signature(s) Signed: 02/25/2023 10:06:00 AM By: [name] Guess MD FACS Entered By: [name] on 02/25/2023 10:06:00 -------------------------------------------------------------------------------- Physical Exam Details Patient Name: Date of Service: [name], [name] 02/25/2023 9:30 A M Medical Record Number: [id number] Patient Account Number: [id number] Date of Birth/Sex: Treating RN: 1951/06/27 (72 y.o. [name] Primary Care Provider: Nicoletta [name] Other Clinician: Referring  Provider: Treating Provider/Extender: [name] in Treatment: 8 Constitutional Slightly hypertensive. . . . no acute distress. Respiratory Normal work of breathing on room air. Notes 02/25/2023: His periwound skin is quite excoriated and he has a lot of clear yellow drainage. The main wounds are filling in further and epithelializing. Electronic Signature(s) Signed: 02/25/2023 10:08:11 AM By: [name] Guess MD FACS Entered By: [name] on 02/25/2023 10:08:11 -------------------------------------------------------------------------------- Physician Orders Details Patient Name: Date of  Service: [name] 02/25/2023 9:30 A M Medical Record Number: [id number] Patient Account Number: [id number] Date of Birth/Sex: Treating RN: July 23, 1951 (72 y.o. 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 - Dr. [name] RM 1 Return A Friday 8/9 @ 11:00 am Nurse Visit: - Tuesday RM 2 8/6 @ 11:30 am Anesthetic (In clinic) Topical Lidocaine 4% applied to wound bed Bathing/ Shower/ Hygiene May shower with protection but do not get wound dressing(s) wet. Protect dressing(s) with water repellant cover (for example, large plastic bag) or a cast cover and may then take shower. Edema Control - Lymphedema / SCD / Other Lymphedema Pumps. Use Lymphedema pumps on leg(s) 2-3 times a day for 45-60 minutes. If wearing any wraps or hose, do not remove them. Continue exercising as instructed. Elevate legs to the level of the heart or above for 30 minutes daily and/or when sitting for 3-4 times a day throughout the day. - throughout the day Avoid standing for long periods of time. Compression stocking or Garment 20-30 mm/Hg pressure to: - juxtalite left leg daily Other Edema Control Orders/Instructions: - kerlix/coban wrap to left leg while wife recovers from surgery Wound  Treatment Wound #3 - Lower Leg Wound Laterality: Right, Medial Cleanser: Soap and Water 2 x Per Week/30 Days Discharge Instructions: May shower and wash wound with dial antibacterial soap and water prior to dressing change. Cleanser: Vashe 5.8 (oz) 2 x Per Week/30 Days Discharge Instructions: Cleanse the wound with Vashe prior to applying a clean dressing using gauze sponges, not tissue or cotton balls. Peri-Wound Care: Zinc Oxide Ointment 30g tube 2 x Per Week/30 Days Discharge Instructions: Apply Zinc Oxide to periwound with each dressing change Peri-Wound Care: Sween Lotion (Moisturizing lotion) 2 x Per Week/30 Days Discharge Instructions: Apply moisturizing lotion as directed Topical: Gentamicin 2 x Per Week/30 Days Discharge Instructions: mixed 50:50 with mupirocin to wound bed Topical: Mupirocin Ointment 2 x Per Week/30 Days Discharge Instructions: Apply Mupirocin (Bactroban) as instructed Prim Dressing: UrgoClean AG 4 X 5 (in/in) 2 x Per Week/30 Days ary Discharge Instructions: Apply directly to wound bed on proximal part of wound area Secondary Dressing: ABD Pad, 8x10 2 x Per Week/30 Days Discharge Instructions: Apply over primary dressing as directed. Secondary Dressing: Zetuvit Plus 4x8 in 2 x Per Week/30 Days Discharge Instructions: Apply over primary dressing as directed. Compression Wrap: CoFlex Zinc Unna Boot, 4 x 6 (in/yd) 2 x Per Week/30 Days Discharge Instructions: Apply Coflex Zinc [employer] as directed. Wound #6 -  Lower Leg Wound Laterality: Right, Anterior Peri-Wound Care: Sween Lotion (Moisturizing lotion) 2 x Per Week/30 Days Discharge Instructions: Apply moisturizing lotion as directed Topical: Gentamicin 2 x Per Week/30 Days Discharge Instructions: As directed by physician Topical: Mupirocin Ointment 2 x Per Week/30 Days Discharge Instructions: Apply Mupirocin (Bactroban) as instructed Prim Dressing: UrgoClean AG 4 X 5 (in/in) 2 x Per Week/30 Days ary Discharge  Instructions: Apply directly to wound bed. Secondary Dressing: Woven Gauze Sponge, Non-Sterile 4x4 in 2 x Per Week/30 Days Discharge Instructions: Apply over primary dressing as directed. Compression Wrap: CoFlex Zinc Unna Boot, 4 x 6 (in/yd) 2 x Per Week/30 Days Discharge Instructions: Apply Coflex Zinc [employer] as directed. Electronic Signature(s) Signed: 02/25/2023 1:18:06 PM By: [name] Deed RN, BSN Signed: 02/28/2023 7:45:42 AM By: [name] Guess MD FACS Previous Signature: 02/25/2023 10:22:54 AM Version By: [name] Guess MD FACS [name] ([id number]) [id number] Page 7 of 15 Entered By: [name] on 02/25/2023 12:19:25 -------------------------------------------------------------------------------- Problem List Details Patient Name: Date of Service: [name], [name] 02/25/2023 9:30 A M Medical Record Number: [id number] Patient Account Number: [id number] Date of Birth/Sex: Treating RN: 1950-12-13 (72 y.o. 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) 02/09/2022 No Yes I89.0 Lymphedema, not elsewhere classified 02/09/2022 No Yes I10 Essential (primary) hypertension 02/09/2022 No Yes I25.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 angina pectoris 02/09/2022 No Yes Z86.718 Personal history of other venous thrombosis and embolism 02/09/2022 No Yes Inactive Problems ICD-10 Code Description Active Date Inactive Date L97.512 Non-pressure chronic ulcer of other part of right foot with fat layer exposed 02/17/2022 02/17/2022 Resolved  Problems ICD-10 Code Description Active Date Resolved Date L97.522 Non-pressure chronic ulcer of other part of left foot with fat layer exposed 02/09/2022 02/09/2022 Electronic Signature(s) Signed: 02/25/2023 10:03:19 AM By: [name] Guess MD FACS Entered By: [name] on 02/25/2023 10:03:19 [name] ([id number]) [id number] Page 8 of 15 -------------------------------------------------------------------------------- Progress Note Details Patient Name: Date of Service: [name], [name] 02/25/2023 9:30 A M Medical Record Number: [id number] Patient Account Number: [id number] Date of Birth/Sex: Treating RN: [birth year] (72 y.o. [name] Primary Care Provider: Nicoletta [name] Other Clinician: Referring Provider: Treating Provider/Extender: [name] in Treatment: 35 Subjective Chief Complaint Information obtained from Patient Patient seen for complaints of Non-Healing Wounds. History of Present Illness (HPI) ADMISSION 02/09/2022 This is a 72 year old man who recently moved to [place] from [place]. He has a history of of coronary artery disease and prior left popliteal vein DVT . He is not diabetic and does not smoke. 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 He has had multiple spinal surgeries and has limited mobility. He primarily uses a motorized wheelchair. He has had lymphedema for many years and does have lymphedema pumps although he says he does not use them frequently. He has wounds on his bilateral lower extremities. He was receiving care in [place] for these prior to his move. They were apparently packing his wounds with Betadine soaked gauze. He has worn compression wraps in the past but not recently. He did say that they helped tremendously. In clinic today, his right ABI is noncompressible but has good signals; the  left ABI was 1.17. No formal vascular studies have been performed. On his  left dorsal foot, there is a circular lesion that exposes the fat layer. There is fairly heavy slough accumulation within the wound, but no significant odor. On his right medial calf, he has multiple pinholes that have slough buildup in them and probe for about 2 to 4 mm in depth. They are draining clear fluid. On his right lateral midfoot, he has ulceration consistent with venous stasis. It is geographic and has slough accumulation. 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 02/17/2022: All of the wounds are little bit larger today and have more slough accumulation. Today, the medial calf openings are larger and probe to something that feels calcified. There is odor coming from his wounds. His vascular studies are scheduled for August 9 and 10. 02-24-2022 upon evaluation today patient presents for follow-up concerning his ongoing issues here with his lower extremities. With that being said he does have significant problems with what appears to be cellulitis unfortunately the PCR culture that was sent last week got crushed by UPS in transit. With that being said we are going to reobtain a culture today although I am actually to do it on the right medial leg as this seems to be the most inflamed area today where there is some questionable drainage as well. I am going to remove some of the calcium deposits which are loosening obtain a deeper culture from this area. Fortunately I do not see any evidence of active infection systemically which is good news but locally I think we are having a bigger issue here. He does have his appointment next Thursday with vascular. Patient has also been referred to [facility] for further evaluation and treatment of the calcinosis for possible sulfate injections. 03/04/2022: The culture that was performed last week grew out multiple species including Klebsiella, Morganella, and  Pseudomonas aeruginosa. He had been prescribed Bactrim but this was not adequate to cover all of the species and levofloxacin was recommended. He completed the Bactrim but did not pick up the levofloxacin and begin taking it until yesterday. 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 He had arterial studies performed today. He does have evidence of peripheral arterial disease. Results copied here: +-------+-----------+-----------+------------+------------+ ABI/TBIT oday's ABIT oday's TBIPrevious ABIPrevious TBI +-------+-----------+-----------+------------+------------+ Right [place] 0.75    +-------+-----------+-----------+------------+------------+ Left [place] 0.57    +-------+-----------+-----------+------------+------------+ Arterial wall calcification precludes accurate ankle pressures and ABIs. Summary: Right: Resting right ankle-brachial index indicates noncompressible right lower extremity arteries. The right toe-brachial index is normal. Left: Resting left ankle-brachial index indicates noncompressible left lower extremity arteries. The left toe-brachial index is abnormal. He continues to have further tissue breakdown at the right medial calf and there is ample slough accumulation along with necrotic subcutaneous tissue. The left dorsal foot wound has built up slough, as well. The right medial foot wound is nearly closed with just a light layer of eschar over the surface. The right dorsal lateral foot wound has also accumulated a fair amount of slough and remains quite tender. 03/10/2022: He has been taking the prescribed levofloxacin and has about 3 days left of this. The erythema and induration on his right leg has improved. He continues to experience further breakdown of the right medial calf wound. There is  extensive slough and debris accumulation there. There is heavy slough on his left dorsal foot wound, but no odor or purulent drainage. The right medial foot wound appears to be closed. The right dorsal lateral foot wound also has a fair amount of slough but it is less tender than at our last visit. He still does not have an appointment with dermatology. 03/22/2022: The right anterior tibial wound has closed. The right lateral foot wound is nearly closed with just a little bit of slough. The left dorsal foot wound is smaller and continues to have some slough buildup but less so than at prior visits. There is good granulation tissue underlying the slough. Unfortunately the LATRELL, BONDY ([id number]) [id number] Page 9 of 15 right medial calf wound continues to deteriorate. It has a foul odor today and a lot of necrotic tissue, the surrounding skin is also erythematous and moist. 03/30/2022: The right lateral foot wound continues to contract and just has a little bit of slough and eschar present. The left dorsal foot wound is also smaller with slough overlying good granulation tissue. The right medial calf wound actually looks quite a bit better today; there is no odor and there is much less necrotic tissue although some remains present. We have been using topical gentamicin and he has been taking oral Augmentin. 04/07/2022: The patient saw dermatology at [facility] last week. Unfortunately, it appears that they did not read any of the documentation that was provided. 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 Overall, it was a highly unsatisfactory consultation. In the meantime, however, we were able to identify  compounding pharmacy who could create a topical sodium thiosulfate ointment. The patient has that with him today. Both of the right foot wounds are now closed. The left dorsal foot wound has contracted but still has a layer of slough on the surface. The medial calf wounds on the right all look a little bit better. They still have heavy slough along with the chunks of calcium. Everything is stained purple. 04/14/2022: The wound on his dorsal left foot is a little bit smaller again today. There is still slough accumulation on the surface. The medial calf wounds have quite a bit less slough accumulation today. His right leg, however, is warm and erythematous, concerning for cellulitis. 04/14/2022: He completed his course of Augmentin yesterday. The medial calf wound sites are much cleaner today and shallower. There is less fragmented calcium in the wounds. He has a lot of lymphedema fluid-like drainage from these wounds, but no purulent discharge or malodor. The wound on his dorsal left foot has also contracted and is shallower. There is a layer of slough over healthy granulation tissue. 05/05/2022: The left dorsal foot wound is smaller today with less slough and more granulation tissue. The right medial calf wounds are shallower, but have extensive slough and some fat necrosis. The drainage has diminished considerably, however. 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 05/12/2022: The left dorsal foot wound continues to contract and fill with granulation tissue. There is slough on the wound surface. The right medial calf wounds also continue to fill with healthy tissue, but there  is remaining slough and fat necrosis present. He had a significant increase in his drainage this week and it was a bright yellow-green color. He also had a new wound open over his right anterior tibial surface  associated with the spike of cutaneous calcium. The leg is more red, as well, concerning for infection. 05/19/2022: His culture returned positive for Pseudomonas. He is currently taking a course of oral levofloxacin. We have also been using topical gentamicin mixed in with his sodium thiosulfate. All of the wounds look better this week. 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 There is slough and nonviable subcutaneous tissue still present. The new anterior tibial wound just has a light layer of slough. The dorsal foot wound also has some slough present and appears a little dry today. 05/26/2022: The right medial leg wound continues to improve. It is feeling with granulation tissue, but still accumulates a fairly thick layer of slough on the surface. The more recent anterior tibial wound has remained quite small, but also has some slough on the surface. The left dorsal foot wound has contracted somewhat and has perimeter epithelialization. He completed his oral levofloxacin. 06/02/2022: The left dorsal foot wound continues to improve significantly. There is just a little slough on the wound surface. The right medial leg wound had black drainage, but it looks like silver alginate was applied last week and I theorized that silver sulfide was created with a combination of the silver alginate and the sodium thiosulfate. It did, however, have a bit of an odor to it and extensive slough accumulation. The small anterior tibial wound also had a bit of slough and nonviable subcutaneous tissue present. The skin of his leg was rather macerated, as well. 06/09/2022: The left dorsal foot wound is smaller again this week with just a light layer of slough on the surface. The right medial leg wound looks substantially better. The leg and periwound skin are no longer red and macerated. There is good granulation tissue forming with less slough and nonviable tissue. The anterior tibial  wound just has a small amount of slough accumulation. 06/16/2022: The left dorsal foot wound continues to contract. His wrap slipped a little bit, however, and his foot is more swollen. The right medial leg wound continues to improve. The underlying tissue is more vital-appearing and there is less slough. He does have substantial drainage. The small anterior tibial wound has a bit of slough accumulation. 06/23/2022: The left dorsal foot wound is about half the size as it was last week. There is just some light slough on the surface and some eschar buildup around the edges. The right anterior leg wound is small with a little slough on the surface. Unfortunately, the right medial leg wound deteriorated fairly substantially. It is malodorous with gray/green/black discoloration. 06/30/2022: The left dorsal foot wound continues to contract. It is nearly closed with just a light layer of slough present. The right anterior leg wound is about the same size and has a small amount of slough on the surface. The right medial leg wound looks a lot better than it did last week. The odor has abated and the tissue is less pulpy and necrotic-looking. There is still a fairly thick layer of slough present. 12/13; the left dorsal foot wound looks very healthy. There was no need to change the dressing here and no debridement. He has a small area on the right anterior lower leg apparently had not changed much in size. The area  on the medial leg is the most problematic area this is large with some depth and a completely nonviable surface today. 07/14/2022: The left dorsal foot wound is smaller today with just a little slough and eschar present. The right anterior lower leg wound is about the same without any significant change. The medial leg wound has a black and green surface and is malodorous. No purulent drainage and the periwound skin is intact. Edema control is suboptimal. 07/21/2022: The dorsal foot wound continues  to contract and is nearly closed with just a little bit of slough and eschar on the surface. The right anterior lower leg wound is shallower today but otherwise the dimensions are unchanged. The medial leg wound looks significantly better although it still has a nonviable surface; the odor and black/green surface has resolved. His culture came back with multiple species sensitive to Augmentin, which was prescribed and a very low level of Pseudomonas, which should be handled by the topical gentamicin we have been using. 07/28/2022: The dorsal foot wound is down to just a very tiny opening with a little eschar on the surface. The right anterior tibial wound is about the same with some slough buildup. The right medial leg wound looks quite a bit better this week. There is thick rubbery slough on the surface, but the odor and drainage has improved significantly. 08/04/2022: The dorsal foot wound is almost healed, but the periwound skin has broken down. This appears to be secondary to moisture and adhesive from the absorbent pad. The right anterior tibial wound is shallower. The right medial leg wound continues to improve. It is smaller, still with rubbery slough on the surface. No malodor or purulent drainage. 08/11/2022: The dorsal foot wound is about the same size. It is a little bit dry with some slough accumulation. The right anterior tibial wound is also unchanged. The right medial leg wound is filling in further with good granulation tissue. Still with some slough buildup on the surface. 08/18/2022: His left leg is bright red, hot, and swollen with cellulitis. The dorsal foot wound actually looks quite good and is superficial with just a little bit of slough accumulation. The right anterior tibial wound is unchanged. The right medial leg wound has filled in with good granulation tissue but has copious amounts of drainage. 08/25/2022: His cellulitis has responded nicely to the oral antibiotics. His dorsal  foot wound is smaller but has a fair amount of slough buildup. The right anterior tibial wound remains stable and unchanged. The right medial leg wound has more granulation tissue under a layer of slough, but continues to have copious drainage. The drainage is actually causing skin irritation and threatens to result in breakdown. [name], [name] ([id number]) [id number] Page 10 of 15 09/01/2022: He continues to have profuse drainage from his wounds which is resulting in tissue maceration on both the dorsum of his left foot and the periwound distal to his medial leg wound. The dorsal foot wound is nearly healed, despite this. The medial right leg wound is filling in with good granulation tissue. We are working on getting home health assistance to change his dressings more frequently to try and control the drainage better. 09/08/2022: We have had success in controlling his drainage. The tissue maceration has improved significantly and his swelling has come down quite markedly. The medial right leg wound is much more superficial and has substantially less nonviable tissue present. The anterior tibial wound is a little bit larger, however with some nonviable tissue present. 09/15/2022:  The dorsal foot wound is nearly closed. The anterior tibial wound measures slightly larger today. The medial right leg wound continues to improve quite dramatically with less nonviable tissue and more robust-looking granulation. Edema control is significantly better. 09/22/2022: The left dorsal foot wound remains open, albeit quite tiny. The anterior tibial wound is a little bit larger again today. The medial right leg wound continues to improve with an increase in robust and healthy granulation tissue and less accumulation of nonviable tissue. 09/29/2022: His home health providers wrapped his left leg extremely tightly and he ended up having to cut the wrap off of his foot. This resulted in his foot being  very swollen and that the dorsal foot wound is a little bit larger today. The anterior tibial wound is about the same size with a little slough on the surface. The medial right leg wound continues to fill in. There are very few areas with any significant depth. There is still slough accumulation. 3/13; Patient has a left dorsal foot wound along with a right anterior tibial wound and right medial wound. We are using silver alginate to the left dorsal foot wound, Santyl and sodium thiosulfate compound ointment to the right lower extremity wounds under 4 layer compression. Patient has no issues or complaints today. 10/13/2022: The left dorsal foot wound is a little bit bigger today, but his foot is also a little bit more swollen. The right medial leg wound has filled in more and has substantially less depth. There is still some nonviable tissue present. The small anterior lower leg wound looks about the same. 10/20/2022: The left dorsal foot wound is epithelializing. There does not appear to be much open at this site. There is some eschar around the edges. The right medial leg wound continues to fill in with granulation tissue. The small anterior lower leg wound is a little bit bigger and there is a chunk of calcium protruding. He continues to have fairly copious drainage from the right leg. 10/27/2022: His home health agency failed to show up at all this past week and as a result, the excessive drainage from his right leg wound has resulted in tissue breakdown. His leg is red and irritated. The wounds themselves look about the same. The wound on his left dorsal foot is nearly closed underneath some eschar. 11/03/2022: We continue to have difficulty with his home health agency. He continues to have significant drainage from his right leg wound which has resulted in even further tissue breakdown. He also was not taking his Lasix as prescribed and I think much of the drainage has been his lymphedema fluid  choosing the path of least resistance. The wound on his left dorsal foot is closed, but in the process of cleaning the site, dry skin was pulled off and he has a new small superficial wound just adjacent to where the dorsal foot wound was. 11/12/2022: His left dorsal foot is completely healed. The right medial leg continues to deteriorate. He is not taking his Lasix as prescribed, nor is he using his lymphedema pumps at all. As a result, he has massive amounts of drainage saturating his dressings and causing further tissue breakdown on his medial leg. Today, his dressings had a foul odor, as well. 11/19/2022: His right medial leg looks even worse today. There is more erythema and moisture related tissue breakdown. The 2 anterior wounds look a little bit better. The culture that I took last week did produce Pseudomonas and he is currently taking levofloxacin  for this. 11/26/2022: There has been significant improvement to his right medial leg. It is far less raw and many of the superficial open areas have epithelialized. He continues to cut the foot portion of his wrap and as result his foot is bulbous and swollen compared to the rest of his leg, where edema control is good. The smaller of the 2 anterior tibial wound is closed, while the larger is contracting somewhat with a bit of slough at the base. 12/03/2022: His leg continues to demonstrate improvement. There has been epithelialization of much of the denuded area. He has a new small anterior tibial wound that has opened around a nidus of calcium. Edema control is improved. 12/10/2022: Continued improvement in the periwound skin. The anterior tibial wounds are about the same, but the medial leg wound has improved further. There is less nonviable tissue present. 12/17/2022: Between his visit on Tuesday with the nurse and today, he has had significantly more drainage which has resulted in some periwound tissue maceration. I suspect this correlates somewhat  with his diuretic use, as he says he takes his diuretic religiously on Saturday, Sunday, and Monday and then does not take it after Tuesday due to being out and about and not being close to easy restroom access. I suspect the excess fluid is just simply draining out of his leg. 12/21/2022: This was supposed to be a nurse visit, but his leg has broken down so significantly, that we have converted to a wound care visit. He did take his diuretics over the weekend, but despite this, he has had copious drainage and all of his tissues have broken down substantially from the right medial leg ulcer all the way down to his ankle and even wrapping posterior to this. There is blue-green staining on his dressing, suggesting ongoing presence of Pseudomonas aeruginosa. 12/24/2022: The wound already looks better than it did 2 days ago. He has been taking ciprofloxacin and the Urgo clean dressing we applied did a much better job of wicking the drainage away from his leg. There is slough on the entire surface of the medial leg wound, but the erythema and induration has improved in the past couple of days. 12/31/2022: Continued improvement of the wound. He says that the pain has basically abated since being on ciprofloxacin. Still with fairly heavy drainage and slough accumulation. 01/07/2023: Overall continued improvement. Still with heavy slough buildup, but less drainage. 01/14/2023: There is a little bit of improvement. The skin is still looking a bit excoriated. He has a few more days left of ciprofloxacin. 01/21/2023: There has been significant improvement. The periwound skin is in much better condition. The ulcers are shallower and actually have some good granulation tissue present under some slough. 01/28/2023: The wound continues to improve. All of the open areas measured smaller today. There is slough accumulation on the surfaces. His drainage has decreased markedly. 02/04/2023: Continued improvement in the wound.  Drainage remains significantly diminished. Slough accumulation is present but to a lesser extent. Periwound skin continues to improve. Edema control is good. 02/18/2023: The heavy drainage persists, although it is was not as significant today as it was at his nurse visit on Tuesday. The drainage is yellow and does not have the blue-green discoloration suggestive of Pseudomonas. The actual wounds on his leg are getting smaller, but the periwound skin is quite excoriated. 02/25/2023: His periwound skin is quite excoriated and he has a lot of clear yellow drainage. The main wounds are filling in further and epithelializing.  Patient History [name], [name] ([id number]) [id number] Page 11 of 15 Information obtained from Patient, Chart. Family History Cancer - Mother, Heart Disease - Father,Mother, Hypertension - Father,Siblings, Lung Disease - Siblings, No family history of Diabetes, Hereditary Spherocytosis, Kidney Disease, Seizures, Stroke, Thyroid Problems, Tuberculosis. Social History Former smoker - quit 1991, Marital Status - Married, Alcohol Use - Moderate, Drug Use - No History, Caffeine Use - Daily - coffee. Medical History Eyes Patient has history of Cataracts - right eye removed Denies history of Glaucoma, Optic Neuritis Ear/Nose/Mouth/Throat Denies history of Chronic sinus problems/congestion, Middle ear problems Hematologic/Lymphatic Patient has history of Anemia - macrocytic, Lymphedema Cardiovascular Patient has history of Congestive Heart Failure, Deep Vein Thrombosis - left leg, Hypertension, Peripheral Venous Disease Endocrine Denies history of Type I Diabetes, Type II Diabetes Genitourinary Denies history of End Stage Renal Disease Integumentary (Skin) Denies history of History of Burn Oncologic Denies history of Received Chemotherapy, Received Radiation Psychiatric Denies history of Anorexia/bulimia, Confinement Anxiety Hospitalization/Surgery  History - cervical fusion. - lumbar surgery. - coronary stent placement. - lasik eye surgery. - hydrocele excision. Medical A Surgical History Notes nd Constitutional Symptoms (General Health) obesity Ear/Nose/Mouth/Throat hard of hearing Respiratory frozen diaphragm right Cardiovascular hyperlipidemia Musculoskeletal myositis, lumbar DDD, spinal stenosis, cervical facet joint syndrome Objective Constitutional Slightly hypertensive. no acute distress. Vitals Time Taken: 9:21 AM, Height: 71 in, Weight: 267 lbs, BMI: 37.2, Temperature: 97.9 F, Pulse: 70 bpm, Respiratory Rate: 20 breaths/min, Blood Pressure: 144/73 mmHg. Respiratory Normal work of breathing on room air. General Notes: 02/25/2023: His periwound skin is quite excoriated and he has a lot of clear yellow drainage. The main wounds are filling in further and epithelializing. Integumentary (Hair, Skin) Wound #3 status is Open. Original cause of wound was Gradually Appeared. The date acquired was: 12/07/2021. The wound has been in treatment 54 weeks. The wound is located on the Right,Medial Lower Leg. The wound measures 9cm length x 6cm width x 0.4cm depth; 42.412cm^2 area and 16.965cm^3 volume. There is Fat Layer (Subcutaneous Tissue) exposed. There is no tunneling or undermining noted. There is a large amount of serous drainage noted. The wound margin is indistinct and nonvisible. There is large (67-100%) red granulation within the wound bed. There is a small (1-33%) amount of necrotic tissue within the wound bed including Adherent Slough. The periwound skin appearance had no abnormalities noted for moisture. The periwound skin appearance exhibited: Excoriation, Erythema. The surrounding wound skin color is noted with erythema which is circumferential. Periwound temperature was noted as No Abnormality. Wound #6 status is Open. Original cause of wound was Gradually Appeared. The date acquired was: 05/12/2022. The wound has been in  treatment 41 weeks. The wound is located on the Right,Anterior Lower Leg. The wound measures 1.5cm length x 0.4cm width x 0.3cm depth; 0.471cm^2 area and 0.141cm^3 volume. There is Fat Layer (Subcutaneous Tissue) exposed. There is no tunneling or undermining noted. There is a medium amount of serous drainage noted. The wound margin is distinct with the outline attached to the wound base. There is small (1-33%) pink granulation within the wound bed. There is a small (1-33%) amount of necrotic tissue within the wound bed including Adherent Slough. The periwound skin appearance exhibited: Excoriation, Erythema. The surrounding wound skin color is noted with erythema. Periwound temperature was noted as No Abnormality. 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 There was a Selective/Open Wound Non-Viable Tissue Debridement with a total area of 12.72 sq cm performed by [name] Guess, MD. With the following instrument(s): Curette to remove Non-Viable tissue/material. Material removed includes [facility] after achieving pain control using Lidocaine 4% Topical Solution. No specimens were taken. A time out was conducted at 09:50, prior to the start of the procedure. A Minimum amount of bleeding was controlled with Pressure. The procedure was tolerated well with a pain level of 1 throughout and a pain level of 0 following the procedure.  Post Debridement Measurements: 9cm length x 6cm width x 0.4cm depth; 16.965cm^3 volume. Character of Wound/Ulcer Post Debridement is stable. Post procedure Diagnosis Wound #3: Same as Pre-Procedure General Notes: Scribed for Dr. [name] by [name] Deed, RN. Pre-procedure diagnosis of Wound #3 is a Lymphedema located on the Right,Medial Lower Leg . There was a Radio broadcast assistant Compression Therapy Procedure by [name] Deed, RN. Post procedure Diagnosis Wound #3: Same as Pre-Procedure Notes: coflex zinc. There was a Three Layer Compression Therapy Procedure by [name] Deed, RN. Post procedure Diagnosis Wound #: Same as Pre-Procedure Plan Follow-up Appointments: Return Appointment in 1 week. - Dr. [name] RM 1 Friday 8/9 @ 11:00 am Nurse Visit: - Tuesday RM 2 8/6 @ 11:30 am Anesthetic: (In clinic) Topical Lidocaine 4% applied to wound bed Bathing/ Shower/ Hygiene: May shower with protection but do not get wound dressing(s) wet. Protect dressing(s) with water repellant cover (for example, large plastic bag) or a cast cover and may then take shower. Edema Control - Lymphedema / SCD / Other: Lymphedema Pumps. Use Lymphedema pumps on leg(s) 2-3 times a day for 45-60 minutes. If wearing any wraps or hose, do not remove them. Continue exercising as instructed. Elevate legs to the level of the heart or above for 30 minutes daily and/or when sitting for 3-4 times a day throughout the day. - throughout the day Avoid standing for long periods of time. Compression stocking or Garment 20-30 mm/Hg pressure to: - juxtalite left leg daily Other Edema Control Orders/Instructions: - kerlix/coban wrap to left leg while wife recovers from surgery WOUND #3: - Lower Leg Wound Laterality: Right, Medial Cleanser: Soap and Water 2 x Per Week/30 Days Discharge Instructions: May shower and wash wound with dial antibacterial soap and water prior to dressing change. Cleanser: Vashe 5.8 (oz) 2 x Per Week/30  Days Discharge Instructions: Cleanse the wound with Vashe prior to applying a clean dressing using gauze sponges, not tissue or cotton balls. Peri-Wound Care: Zinc Oxide Ointment 30g tube 2 x Per Week/30 Days Discharge Instructions: Apply Zinc Oxide to periwound with each dressing change Peri-Wound Care: Sween Lotion (Moisturizing lotion) 2 x Per Week/30 Days Discharge Instructions: Apply moisturizing lotion as directed Topical: Gentamicin 2 x Per Week/30 Days Discharge Instructions: mixed 50:50 with mupirocin to wound bed Topical: Mupirocin Ointment 2 x Per Week/30 Days Discharge Instructions: Apply Mupirocin (Bactroban) as instructed Prim Dressing: UrgoClean AG 4 X 5 (in/in) 2 x Per Week/30 Days ary Discharge Instructions: Apply directly to wound bed on proximal part of wound area Secondary Dressing: ABD Pad, 8x10 2 x Per Week/30 Days Discharge Instructions: Apply over primary dressing as directed. Secondary Dressing: Zetuvit Plus 4x8 in 2 x  Per Week/30 Days Discharge Instructions: Apply over primary dressing as directed. Com pression Wrap: CoFlex Zinc Unna Boot, 4 x 6 (in/yd) 2 x Per Week/30 Days Discharge Instructions: Apply Coflex Zinc [employer] as directed. WOUND #6: - Lower Leg Wound Laterality: Right, Anterior Peri-Wound Care: Sween Lotion (Moisturizing lotion) 2 x Per Week/30 Days Discharge Instructions: Apply moisturizing lotion as directed Topical: Gentamicin 2 x Per Week/30 Days Discharge Instructions: As directed by physician Topical: Mupirocin Ointment 2 x Per Week/30 Days Discharge Instructions: Apply Mupirocin (Bactroban) as instructed Prim Dressing: UrgoClean AG 4 X 5 (in/in) 2 x Per Week/30 Days ary Discharge Instructions: Apply directly to wound bed. Secondary Dressing: Woven Gauze Sponge, Non-Sterile 4x4 in 2 x Per Week/30 Days [name], [name] ([id number]) [id number] Page 13 of 15 Discharge Instructions: Apply over primary dressing as  directed. Compression Wrap: CoFlex Zinc Unna Boot, 4 x 6 (in/yd) 2 x Per Week/30 Days Discharge Instructions: Apply Coflex Zinc [employer] as directed. 02/25/2023: His periwound skin is quite excoriated and he has a lot of clear yellow drainage. The main wounds are filling in further and epithelializing. I used a curette to debride slough from the open wound surfaces. We are going to try wrapping his leg with zinc-based Unna boot over topical gentamicin and mupirocin with Urgo clean over the main area of the wound and just absorbent layers over the areas of excoriation. He is currently completing a course of Augmentin for the culture that was obtained last week. He will have a nurse visit next Tuesday where we can assess the results of this change. Follow-up with me a week from today. Electronic Signature(s) Signed: 02/25/2023 1:18:06 PM By: [name] Deed RN, BSN Signed: 02/28/2023 7:45:42 AM By: [name] Guess MD FACS Previous Signature: 02/25/2023 10:11:12 AM Version By: [name] Guess MD FACS Entered By: [name] on 02/25/2023 12:22:57 -------------------------------------------------------------------------------- HxROS Details Patient Name: Date of Service: [name], [name] 02/25/2023 9:30 A M Medical Record Number: [id number] Patient Account Number: [id number] Date of Birth/Sex: Treating RN: Feb 06, 1951 (72 y.o. [name] Primary Care Provider: Nicoletta [name] Other Clinician: Referring Provider: Treating Provider/Extender: [name] in Treatment: 41 Information Obtained From Patient Chart Constitutional Symptoms (General Health) Medical History: Past Medical History Notes: obesity Eyes Medical History: Positive for: Cataracts - right eye removed Negative for: Glaucoma; Optic Neuritis Ear/Nose/Mouth/Throat Medical History: Negative for: Chronic sinus problems/congestion; Middle ear problems Past Medical History Notes: hard of  hearing Hematologic/Lymphatic Medical History: Positive for: Anemia - macrocytic; Lymphedema Respiratory Medical History: Past Medical History Notes: frozen diaphragm right Cardiovascular Medical History: Positive for: Congestive Heart Failure; Deep Vein Thrombosis - left leg; Hypertension; Peripheral Venous Disease Past Medical History Notes: hyperlipidemia ESSAM, PARKMAN ([id number]) [id number] Page 14 of 15 Endocrine Medical History: Negative for: Type I Diabetes; Type II Diabetes Genitourinary Medical History: Negative for: End Stage Renal Disease Integumentary (Skin) Medical History: Negative for: History of Burn Musculoskeletal Medical History: Past Medical History Notes: myositis, lumbar DDD, spinal stenosis, cervical facet joint syndrome Oncologic Medical History: Negative for: Received Chemotherapy; Received Radiation Psychiatric Medical History: Negative for: Anorexia/bulimia; Confinement Anxiety HBO Extended History Items Eyes: Cataracts Immunizations Pneumococcal Vaccine: Received Pneumococcal Vaccination: Yes Received Pneumococcal Vaccination On or After 60th Birthday: Yes Implantable Devices No devices added Hospitalization / Surgery History Type of Hospitalization/Surgery cervical fusion lumbar surgery coronary stent placement lasik eye surgery hydrocele excision Family and Social History Cancer: Yes - Mother; Diabetes: No; Heart Disease: Yes - Father,Mother; Hereditary Spherocytosis: No; Hypertension: Yes - Father,Siblings;  Kidney Disease: No; Lung Disease: Yes - Siblings; Seizures: No; Stroke: No; Thyroid Problems: No; Tuberculosis: No; Former smoker - quit 1991; Marital Status - Married; Alcohol Use: Moderate; Drug Use: No History; Caffeine Use: Daily - coffee; Financial Concerns: No; Food, Clothing or Shelter Needs: No; Support System Lacking: No; Transportation Concerns: No Electronic Signature(s) Signed: 02/25/2023  10:22:54 AM By: [name] Guess MD FACS Signed: 02/25/2023 1:18:06 PM By: [name] Deed RN, BSN Entered By: [name] on 02/25/2023 10:06:07 -------------------------------------------------------------------------------- SuperBill Details Patient Name: Date of Service: [name], [name] 02/25/2023 Medical Record Number: [id number] Patient Account Number: [id number] Date of Birth/Sex: Treating RN: 1950-09-02 (72 y.o. [name] [place], Alicia ([id number]) [id number] Page 15 of 15 Primary Care Provider: Nicoletta [name] Other Clinician: Referring Provider: Treating Provider/Extender: [name] in Treatment: 54 Diagnosis Coding ICD-10 Codes Code Description [phone] 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) I89.0 Lymphedema, not elsewhere classified I10 Essential (primary) hypertension I25.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 angina pectoris Z86.718 Personal history of other venous thrombosis and embolism Facility Procedures : CPT4 Code: [id number] Description: 97597 - DEBRIDE WOUND 1ST 20 SQ CM OR < ICD-10 Diagnosis Description 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ed Modifier: Quantity: 1 : CPT4 Code: [id number] Description: (Facility Use Only) 29581LT - APPLY MULTLAY COMPRS LWR LT LEG Modifier: 59 Quantity: 1 Physician Procedures : CPT4 Code Description Modifier [id number] - WC PHYS LEVEL 4 - EST PT 25 ICD-10 Diagnosis Description 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) I89.0 Lymphedema, not elsewhere classified Quantity: 1 : [id number] 97597 - WC PHYS DEBR WO ANESTH 20 SQ CM ICD-10 Diagnosis Description  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ed Quantity: 1 Electronic Signature(s) Signed: 02/25/2023 1:18:06 PM By: [name] Deed RN, BSN Signed: 02/28/2023 7:45:42 AM By: [name] Guess MD FACS Previous Signature: 02/25/2023 10:11:50 AM Version By: [name] Guess MD FACS Entered By: [name] on 02/25/2023 12:23:16

## 2023-03-01 ENCOUNTER — Encounter (HOSPITAL_BASED_OUTPATIENT_CLINIC_OR_DEPARTMENT_OTHER): Payer: Medicare Other | Admitting: General Surgery

## 2023-03-01 DIAGNOSIS — L942 Calcinosis cutis: Secondary | ICD-10-CM | POA: Diagnosis not present

## 2023-03-01 DIAGNOSIS — I872 Venous insufficiency (chronic) (peripheral): Secondary | ICD-10-CM | POA: Diagnosis not present

## 2023-03-01 DIAGNOSIS — L97812 Non-pressure chronic ulcer of other part of right lower leg with fat layer exposed: Secondary | ICD-10-CM | POA: Diagnosis not present

## 2023-03-01 DIAGNOSIS — L97521 Non-pressure chronic ulcer of other part of left foot limited to breakdown of skin: Secondary | ICD-10-CM | POA: Diagnosis not present

## 2023-03-01 DIAGNOSIS — L98492 Non-pressure chronic ulcer of skin of other sites with fat layer exposed: Secondary | ICD-10-CM | POA: Diagnosis not present

## 2023-03-01 DIAGNOSIS — L97821 Non-pressure chronic ulcer of other part of left lower leg limited to breakdown of skin: Secondary | ICD-10-CM | POA: Diagnosis not present

## 2023-03-01 NOTE — Progress Notes (Signed)
JERUSALEM, MUNKRES ([id number]) [id number] Page 1 of 5 Visit Report for 03/01/2023 Arrival Information Details Patient Name: Date of Service: DARSHAUN, HEIBEL 03/01/2023 11:30 A M Medical Record Number: [id number] Patient Account Number: [id number] Date of Birth/Sex: Treating RN: Feb 15, 1951 (72 y.o. [name] Primary Care Vihaan Gloss: [name] Other Clinician: Referring Kue Fox: Treating Traver Meckes/Extender: [name] in Treatment: 98 Visit Information History Since Last Visit Added or deleted any medications: No Patient Arrived: Wheel Chair Any new allergies or adverse reactions: No Arrival Time: 12:04 Had a fall or experienced change in No Accompanied By: self activities of daily living that may affect Transfer Assistance: None risk of falls: Patient Identification Verified: Yes Signs or symptoms of abuse/neglect since last visito No Secondary Verification Process Completed: Yes Hospitalized since last visit: No Patient Requires Transmission-Based Precautions: No Implantable device outside of the clinic excluding No Patient Has Alerts: Yes cellular tissue based products placed in the center Patient Alerts: R ABI= N/C, TBI= .75 since last visit: L ABI =N/C, TBI = .57 Has Dressing in Place as Prescribed: Yes Has Compression in Place as Prescribed: Yes Pain Present Now: No Electronic Signature(s) Signed: 03/01/2023 4:14:43 PM By: [name] Entered By: [name] on 03/01/2023 12:04:40 -------------------------------------------------------------------------------- Compression Therapy Details Patient Name: Date of Service: REMER, STANGO 03/01/2023 11:30 A M Medical Record Number: [id number] Patient Account Number: [id number] Date of Birth/Sex: Treating RN: 19-Oct-1950 (72 y.o. [name] Primary Care Briona Korpela: [name] Other Clinician: Referring Cartha Rotert: Treating Stephaniemarie Stoffel/Extender: [name] in Treatment: 55 Compression Therapy Performed for Wound Assessment: Wound #3 Right,Medial Lower Leg Performed By: Clinician [name], RN Compression Type: Three Layer Electronic Signature(s) Signed: 03/01/2023 4:14:43 PM By: [name] Entered By: [name] on 03/01/2023 12:05:23 -------------------------------------------------------------------------------- Encounter Discharge Information Details Patient Name: Date of Service: [name] 03/01/2023 11:30 A M Medical Record Number: [id number] Patient Account Number: [id number] HIYAB, EVERARD ([id number]) [phone].pdf Page 2 of 5 Date of Birth/Sex: Treating RN: April 28, 1951 (72 y.o. [name] Primary Care Tamira Ryland: Other Clinician: [name] Referring Christophor Eick: Treating  Shellhammer/Extender: [name] in Treatment: 39 Encounter Discharge Information Items Discharge Condition: Stable Ambulatory Status: Wheelchair Discharge Destination: Home Transportation: Private Auto Accompanied By: self Schedule Follow-up Appointment: Yes Clinical Summary of Care: Patient Declined Electronic Signature(s) Signed: 03/01/2023 4:14:43 PM By: Gelene Mink By: [name] on 03/01/2023 12:05:50 -------------------------------------------------------------------------------- Patient/Caregiver Education Details Patient Name: Date of Service: Wenger, [name] 8/6/2024andnbsp11:30 A M Medical Record Number: [id number] Patient Account Number: [id number] Date of Birth/Gender: Treating RN: 1950-12-14 (72 y.o. [name] Primary Care Physician: [name] Other Clinician: Referring Physician: Treating Physician/Extender: [name] in Treatment: 18 Education Assessment Education Provided To: Patient Education Topics Provided Wound/Skin Impairment: Methods:  Explain/Verbal Responses: Reinforcements needed, State content correctly Electronic Signature(s) Signed: 03/01/2023 4:14:43 PM By: [name] Entered By: [name] on 03/01/2023 12:05:41 -------------------------------------------------------------------------------- Wound Assessment Details Patient Name: Date of Service: ODAS, BLASKE 03/01/2023 11:30 A M Medical Record Number: [id number] Patient Account Number: [id number] Date of Birth/Sex: Treating RN: 1951/03/05 (72 y.o. [name] Primary Care Norrine Ballester: [name] Other Clinician: Referring Saga Balthazar: Treating Brendalyn Vallely/Extender: [name] in Treatment: 55 Wound Status Wound Number: 3 Primary Lymphedema Etiology: Wound Location: Right, Medial Lower Leg Wound Open Wounding Event: Gradually Appeared Status: Date Acquired: 12/07/2021 TREYSHON, TIMLIN ([id number]) [phone].pdf Page 3 of 5 Date Acquired: 12/07/2021 Comorbid Cataracts, Anemia, Lymphedema, Congestive Heart Failure, Deep Weeks  Of Treatment: 55 History: Vein Thrombosis, Hypertension, Peripheral Venous Disease Clustered Wound: No Wound Measurements Length: (cm) 9 Width: (cm) 6 Depth: (cm) 0.4 Area: (cm) 42.412 Volume: (cm) 16.965 % Reduction in Area: -694.1% % Reduction in Volume: -694.2% Epithelialization: Small (1-33%) Wound Description Classification: Full Thickness With Exposed Suppo Wound Margin: Indistinct, nonvisible Exudate Amount: Large Exudate Type: Serous Exudate Color: amber rt Structures Foul Odor After Cleansing: No Slough/Fibrino Yes Wound Bed Granulation Amount: Large (67-100%) Exposed Structure Granulation Quality: Red Fascia Exposed: No Necrotic Amount: Small (1-33%) Fat Layer (Subcutaneous Tissue) Exposed: Yes Necrotic Quality: Adherent Slough Tendon Exposed: No Muscle Exposed: No Joint Exposed: No Bone Exposed: No Periwound Skin Texture Texture Color No  Abnormalities Noted: No No Abnormalities Noted: No Excoriation: Yes Erythema: Yes Erythema Location: Circumferential Moisture No Abnormalities Noted: Yes Temperature / Pain Temperature: No Abnormality Treatment Notes Wound #3 (Lower Leg) Wound Laterality: Right, Medial Cleanser Soap and Water Discharge Instruction: May shower and wash wound with dial antibacterial soap and water prior to dressing change. Vashe 5.8 (oz) Discharge Instruction: Cleanse the wound with Vashe prior to applying a clean dressing using gauze sponges, not tissue or cotton balls. Peri-Wound Care Zinc Oxide Ointment 30g tube Discharge Instruction: Apply Zinc Oxide to periwound with each dressing change Sween Lotion (Moisturizing lotion) Discharge Instruction: Apply moisturizing lotion as directed Topical Gentamicin Discharge Instruction: mixed 50:50 with mupirocin to wound bed Mupirocin Ointment Discharge Instruction: Apply Mupirocin (Bactroban) as instructed Primary Dressing UrgoClean AG 4 X 5 (in/in) Discharge Instruction: Apply directly to wound bed on proximal part of wound area Secondary Dressing ABD Pad, 8x10 Discharge Instruction: Apply over primary dressing as directed. Zetuvit Plus 4x8 in Discharge Instruction: Apply over primary dressing as directed. Secured With Compression Wrap CoFlex Zinc [employer], 4 x 6 (in/yd) Discharge Instruction: Apply Coflex Zinc [employer] as directed. TRITON, ALTICE ([id number]) [id number] Page 4 of 5 Compression Stockings Add-Ons Electronic Signature(s) Signed: 03/01/2023 4:14:43 PM By: [name] Entered By: [name] on 03/01/2023 12:05:04 -------------------------------------------------------------------------------- Wound Assessment Details Patient Name: Date of Service: LAWSEN, PATA 03/01/2023 11:30 A M Medical Record Number: [id number] Patient Account Number: [id number] Date of Birth/Sex: Treating RN: 1951/06/18 (72  y.o. [name] Primary Care Chadley Dziedzic: [name] Other Clinician: Referring Kaitlynn Tramontana: Treating Vivaan Helseth/Extender: [name] in Treatment: 55 Wound Status Wound Number: 6 Primary Calcinosis Etiology: Wound Location: Right, Anterior Lower Leg Wound Open Wounding Event: Gradually Appeared Status: Date Acquired: 05/12/2022 Comorbid Cataracts, Anemia, Lymphedema, Congestive Heart Failure, Deep Weeks Of Treatment: 41 History: Vein Thrombosis, Hypertension, Peripheral Venous Disease Clustered Wound: Yes Wound Measurements Length: (cm) Width: (cm) Depth: (cm) Clustered Quantity: Area: (cm) Volume: (cm) 1.5 % Reduction in Area: -99.6% 0.4 % Reduction in Volume: -487.5% 0.3 Epithelialization: Large (67-100%) 2 0.471 0.141 Wound Description Classification: Full Thickness Without Exposed Sup Wound Margin: Distinct, outline attached Exudate Amount: Medium Exudate Type: Serous Exudate Color: amber port Structures Foul Odor After Cleansing: No Slough/Fibrino Yes Wound Bed Granulation Amount: Small (1-33%) Exposed Structure Granulation Quality: Pink Fascia Exposed: No Necrotic Amount: Small (1-33%) Fat Layer (Subcutaneous Tissue) Exposed: Yes Necrotic Quality: Adherent Slough Tendon Exposed: No Muscle Exposed: No Joint Exposed: No Bone Exposed: No Periwound Skin Texture Texture Color No Abnormalities Noted: No No Abnormalities Noted: No Excoriation: Yes Erythema: Yes Moisture Temperature / Pain No Abnormalities Noted: No Temperature: No Abnormality Treatment Notes Wound #6 (Lower Leg) Wound Laterality: Right, Anterior Cleanser Peri-Wound Care Sween Lotion (Moisturizing lotion) Discharge Instruction: Apply moisturizing lotion as directed  CALHOUN, TROXELL ([id number]) [id number] Page 5 of 5 Topical Gentamicin Discharge Instruction: As directed by physician Mupirocin Ointment Discharge Instruction:  Apply Mupirocin (Bactroban) as instructed Primary Dressing UrgoClean AG 4 X 5 (in/in) Discharge Instruction: Apply directly to wound bed. Secondary Dressing Woven Gauze Sponge, Non-Sterile 4x4 in Discharge Instruction: Apply over primary dressing as directed. Secured With Compression Wrap CoFlex Zinc [employer], 4 x 6 (in/yd) Discharge Instruction: Apply Coflex Zinc [employer] as directed. Compression Stockings Add-Ons Electronic Signature(s) Signed: 03/01/2023 4:14:43 PM By: [name] Entered By: [name] on 03/01/2023 12:05:11

## 2023-03-01 NOTE — Progress Notes (Signed)
LOWE, MCBRATNEY ([id number]) [id number] Page 1 of 1 Visit Report for 03/01/2023 SuperBill Details Patient Name: Date of Service: [name], [name] 03/01/2023 Medical Record Number: [id number] Patient Account Number: [id number] Date of Birth/Sex: Treating RN: [birth year] (72 y.o. 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) I89.0 Lymphedema, not elsewhere classified I10 Essential (primary) hypertension I25.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 angina pectoris Z86.718 Personal history of other venous thrombosis and embolism Facility Procedures CPT4 Code Description Modifier Quantity [phone] (Facility Use Only) [phone] - APPLY MULTLAY COMPRS LWR RT LEG 1 ICD-10 Diagnosis Description 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ed Electronic Signature(s) Signed: 03/01/2023 12:07:14 PM By: Duanne Guess MD FACS Signed: 03/01/2023 4:14:43 PM By: [name] Entered By: [name] on 03/01/2023 12:05:59

## 2023-03-04 ENCOUNTER — Other Ambulatory Visit: Payer: Self-pay

## 2023-03-04 ENCOUNTER — Encounter (HOSPITAL_BASED_OUTPATIENT_CLINIC_OR_DEPARTMENT_OTHER): Payer: Medicare Other | Admitting: General Surgery

## 2023-03-04 ENCOUNTER — Other Ambulatory Visit: Payer: Self-pay | Admitting: Hematology and Oncology

## 2023-03-04 ENCOUNTER — Inpatient Hospital Stay: Payer: Medicare Other | Attending: Hematology and Oncology | Admitting: Hematology and Oncology

## 2023-03-04 ENCOUNTER — Inpatient Hospital Stay: Payer: Medicare Other

## 2023-03-04 VITALS — BP 153/64 | HR 78 | Temp 97.3°F | Resp 16 | Wt 289.7 lb

## 2023-03-04 DIAGNOSIS — D539 Nutritional anemia, unspecified: Secondary | ICD-10-CM | POA: Diagnosis not present

## 2023-03-04 DIAGNOSIS — L97812 Non-pressure chronic ulcer of other part of right lower leg with fat layer exposed: Secondary | ICD-10-CM | POA: Diagnosis not present

## 2023-03-04 DIAGNOSIS — D696 Thrombocytopenia, unspecified: Secondary | ICD-10-CM | POA: Diagnosis not present

## 2023-03-04 DIAGNOSIS — L942 Calcinosis cutis: Secondary | ICD-10-CM | POA: Diagnosis not present

## 2023-03-04 DIAGNOSIS — L97521 Non-pressure chronic ulcer of other part of left foot limited to breakdown of skin: Secondary | ICD-10-CM | POA: Diagnosis not present

## 2023-03-04 DIAGNOSIS — Z7901 Long term (current) use of anticoagulants: Secondary | ICD-10-CM | POA: Diagnosis not present

## 2023-03-04 DIAGNOSIS — Z86718 Personal history of other venous thrombosis and embolism: Secondary | ICD-10-CM | POA: Diagnosis not present

## 2023-03-04 DIAGNOSIS — I872 Venous insufficiency (chronic) (peripheral): Secondary | ICD-10-CM | POA: Diagnosis not present

## 2023-03-04 DIAGNOSIS — L97821 Non-pressure chronic ulcer of other part of left lower leg limited to breakdown of skin: Secondary | ICD-10-CM | POA: Diagnosis not present

## 2023-03-04 DIAGNOSIS — L98492 Non-pressure chronic ulcer of skin of other sites with fat layer exposed: Secondary | ICD-10-CM | POA: Diagnosis not present

## 2023-03-04 DIAGNOSIS — I890 Lymphedema, not elsewhere classified: Secondary | ICD-10-CM | POA: Diagnosis not present

## 2023-03-04 LAB — CMP (CANCER CENTER ONLY)
ALT: 18 U/L (ref 0–44)
AST: 23 U/L (ref 15–41)
Albumin: 3.7 g/dL (ref 3.5–5.0)
Alkaline Phosphatase: 56 U/L (ref 38–126)
Anion gap: 5 (ref 5–15)
BUN: 18 mg/dL (ref 8–23)
CO2: 32 mmol/L (ref 22–32)
Calcium: 8.9 mg/dL (ref 8.9–10.3)
Chloride: 104 mmol/L (ref 98–111)
Creatinine: 0.96 mg/dL (ref 0.61–1.24)
GFR, Estimated: >60 mL/min (ref >60)
Glucose, Bld: 128 mg/dL — ABNORMAL HIGH (ref 70–99)
Potassium: 4 mmol/L (ref 3.5–5.1)
Sodium: 141 mmol/L (ref 135–145)
Total Bilirubin: 0.4 mg/dL (ref 0.3–1.2)
Total Protein: 6.7 g/dL (ref 6.5–8.1)

## 2023-03-04 LAB — CBC WITH DIFFERENTIAL (CANCER CENTER ONLY)
Abs Immature Granulocytes: 0.03 10*3/uL (ref 0.00–0.07)
Basophils Absolute: 0 10*3/uL (ref 0.0–0.1)
Basophils Relative: 0 %
Eosinophils Absolute: 0.3 10*3/uL (ref 0.0–0.5)
Eosinophils Relative: 5 %
HCT: 39.6 % (ref 39.0–52.0)
Hemoglobin: 12.7 g/dL — ABNORMAL LOW (ref 13.0–17.0)
Immature Granulocytes: 1 %
Lymphocytes Relative: 18 %
Lymphs Abs: 1 10*3/uL (ref 0.7–4.0)
MCH: 33.2 pg (ref 26.0–34.0)
MCHC: 32.1 g/dL (ref 30.0–36.0)
MCV: 103.7 fL — ABNORMAL HIGH (ref 80.0–100.0)
Monocytes Absolute: 0.4 10*3/uL (ref 0.1–1.0)
Monocytes Relative: 8 %
Neutro Abs: 3.9 10*3/uL (ref 1.7–7.7)
Neutrophils Relative %: 68 %
Platelet Count: 111 10*3/uL — ABNORMAL LOW (ref 150–400)
RBC: 3.82 MIL/uL — ABNORMAL LOW (ref 4.22–5.81)
RDW: 13.3 % (ref 11.5–15.5)
WBC Count: 5.7 10*3/uL (ref 4.0–10.5)
nRBC: 0 % (ref 0.0–0.2)

## 2023-03-04 LAB — LACTATE DEHYDROGENASE: LDH: 171 U/L (ref 98–192)

## 2023-03-04 NOTE — Progress Notes (Addendum)
[name], [name] ([id number]) [id number] Page 1 of 9 Visit Report for 03/04/2023 Arrival Information Details Patient Name: Date of Service: [name], [name] 03/04/2023 11:00 A M Medical Record Number: [id number] Patient Account Number: [id number] Date of Birth/Sex: Treating RN: [birth year] (72 y.o. M) Primary Care Emma Birchler: [name] Other Clinician: Referring Korvin Valentine: Treating Nikodem Leadbetter/Extender: [name] in Treatment: 66 Visit Information History Since Last Visit All ordered tests and consults were completed: No Patient Arrived: Wheel Chair Added or deleted any medications: No Arrival Time: 11:06 Any new allergies or adverse reactions: No Accompanied By: self Had a fall or experienced change in No Transfer Assistance: None activities of daily living that may affect Patient Identification Verified: Yes risk of falls: Secondary Verification Process Completed: Yes Signs or symptoms of abuse/neglect since last visito No Patient Requires Transmission-Based Precautions: No Hospitalized since last visit: No Patient Has Alerts: Yes Implantable device outside of the clinic excluding No Patient Alerts: R ABI= N/C, TBI= .75 cellular tissue based products placed in the center L ABI =N/C, TBI = .57 since last visit: Has Dressing in Place as Prescribed: Yes Has Compression in Place as Prescribed: Yes Pain Present Now: No Electronic Signature(s) Signed: 03/04/2023 12:54:25 PM By: Zenaida Deed RN, BSN Previous Signature: 03/04/2023 11:28:00 AM Version By: Dayton Scrape Entered By: Zenaida Deed on 03/04/2023 11:42:57 -------------------------------------------------------------------------------- Compression Therapy Details Patient Name: Date of Service: [name] 03/04/2023 11:00 A M Medical Record Number: [id number] Patient Account Number: [id number] Date of Birth/Sex: Treating RN: [birth year] (72 y.o. [name] Primary Care  Berdina Cheever: [name] Other Clinician: Referring Winford Hehn: Treating An Schnabel/Extender: [name] in Treatment: 96 Compression Therapy Performed for Wound Assessment: NonWound Condition Lymphedema - Left Leg Performed By: Clinician Zenaida Deed, RN Compression Type: Three Layer Post Procedure Diagnosis Same as Pre-procedure Electronic Signature(s) Signed: 03/04/2023 12:54:25 PM By: Zenaida Deed RN, BSN Entered By: Zenaida Deed on 03/04/2023 11:56:15 [name] ([id number]) [id number] Page 2 of 9 -------------------------------------------------------------------------------- Compression Therapy Details Patient Name: Date of Service: DRAZEN, ECKERT 03/04/2023 11:00 A M Medical Record Number: [id number] Patient Account Number: [id number] Date of Birth/Sex: Treating RN: [birth year] (72 y.o. [name] Primary Care Erina Hamme: [name] Other Clinician: Referring Darra Rosa: Treating Jobeth Pangilinan/Extender: [name] in Treatment: 65 Compression Therapy Performed for Wound Assessment: Wound #3 Right,Medial Lower Leg Performed By: Clinician Zenaida Deed, RN Compression Type: Three Layer Post Procedure Diagnosis Same as Pre-procedure Electronic Signature(s) Signed: 03/04/2023 12:54:25 PM By: Zenaida Deed RN, BSN Entered By: Zenaida Deed on 03/04/2023 11:56:44 -------------------------------------------------------------------------------- Encounter Discharge Information Details Patient Name: Date of Service: [name] 03/04/2023 11:00 A M Medical Record Number: [id number] Patient Account Number: [id number] Date of Birth/Sex: Treating RN: Oct 27, 1950 (72 y.o. [name] Primary Care Shaquela Weichert: [name] Other Clinician: Referring Cydnie Deason: Treating Michael Ventresca/Extender: [name] in Treatment: 66 Encounter Discharge Information Items Post  Procedure Vitals Discharge Condition: Stable Temperature (F): 97.8 Ambulatory Status: Wheelchair Pulse (bpm): 73 Discharge Destination: Home Respiratory Rate (breaths/min): 18 Transportation: Private Auto Blood Pressure (mmHg): 143/70 Accompanied By: self Schedule Follow-up Appointment: Yes Clinical Summary of Care: Patient Declined Electronic Signature(s) Signed: 03/04/2023 12:54:25 PM By: Zenaida Deed RN, BSN Entered By: Zenaida Deed on 03/04/2023 12:47:34 -------------------------------------------------------------------------------- Lower Extremity Assessment Details Patient Name: Date of Service: [name], [name] 03/04/2023 11:00 A M Medical Record Number: [id number] Patient Account Number: [id number] Date of Birth/Sex: Treating RN: Sep 08, 1950 (72 y.o. [name] Primary Care Solash Tullo: [name] Other  Clinician: Referring Selma Mink: Treating Essynce Munsch/Extender: [name] in Treatment: 55 Edema Assessment Left: [Left: Right] [Right: :] Assessed: [Left: No] [Right: No] Edema: [Left: Yes] [Right: Yes] Calf Left: Right: Point of Measurement: From Medial Instep 40 cm 41.8 cm Ankle Left: Right: Point of Measurement: From Medial Instep 27 cm 27.8 cm Vascular Assessment Pulses: Dorsalis Pedis Palpable: [Left:Yes] [Right:No] Extremity colors, hair growth, and conditions: Extremity Color: [Left:Hyperpigmented] [Right:Hyperpigmented] Hair Growth on Extremity: [Left:Yes] [Right:Yes] Temperature of Extremity: [Left:Warm < 3 seconds] [Right:Warm < 3 seconds] Electronic Signature(s) Signed: 03/04/2023 12:54:25 PM By: Zenaida Deed RN, BSN Entered By: Zenaida Deed on 03/04/2023 11:45:43 -------------------------------------------------------------------------------- Multi Wound Chart Details Patient Name: Date of Service: [name] 03/04/2023 11:00 A M Medical Record Number: [id number] Patient Account Number: [id number] Date of  Birth/Sex: Treating RN: 1951/06/26 (72 y.o. [name] Primary Care Joleen Stuckert: [name] Other Clinician: Referring Benno Brensinger: Treating Nayson Traweek/Extender: [name] in Treatment: 81 Vital Signs Height(in): 71 Pulse(bpm): 73 Weight(lbs): 267 Blood Pressure(mmHg): 143/70 Body Mass Index(BMI): 37.2 Temperature(F): 97.8 Respiratory Rate(breaths/min): 20 [3:Photos: No Photos Right, Medial Lower Leg Wound Location: Gradually Appeared Wounding Event: Lymphedema Primary Etiology: Cataracts, Anemia, Lymphedema, Comorbid History: Congestive Heart Failure, Deep Vein Thrombosis, Hypertension, Peripheral Venous Disease  12/07/2021 Date Acquired: 55 Weeks of Treatment: Open Wound Status: No Wound Recurrence: No Clustered Wound: N/A Clustered Quantity: 10x6.3x0.4 Measurements L x W x D (cm) 49.48 A (cm) : rea 19.792 Volume (cm) : -826.40% % Reduction in Area: -826.60% %  Reduction in Volume: Full Thickness With Exposed Support Classification: Structures Large Exudate Amount:] [6:No Photos Right, Anterior Lower Leg Gradually Appeared Calcinosis Cataracts, Anemia, Lymphedema, Congestive Heart Failure, Deep Vein Thrombosis,  Hypertension, Peripheral Venous Disease 05/12/2022 42 Open No Yes 2 1.6x0.4x0.2 0.503 0.101 -113.10% -320.80% Full Thickness Without Exposed Support Structures Medium] [N/A:N/A N/A N/A N/A N/A N/A N/A N/A N/A N/A N/A N/A N/A N/A N/A N/A N/A N/A] [name], [name] ([id number]) [3:Serosanguineous Exudate Type: red, brown Exudate Color: Indistinct, nonvisible Wound Margin: Large (67-100%) Granulation Amount: Red Granulation Quality: Small (1-33%) Necrotic Amount: Fat Layer (Subcutaneous Tissue): Yes Fat Layer (Subcutaneous  Tissue): Yes N/A Exposed Structures: Fascia: No Tendon: No Muscle: No Joint: No Bone: No Small (1-33%) Epithelialization: Debridement - Selective/Open Wound N/A Debridement: Pre-procedure Verification/Time Out 11:55 Taken: Lidocaine 4%  Topical Solution  Pain Control: Slough Tissue Debrided: Non-Viable Tissue Level: 14.84 Debridement A (sq cm): rea Curette Instrument: Minimum Bleeding: Pressure Hemostasis A chieved: 0 Procedural Pain: 0 Post Procedural Pain: Procedure was tolerated well Debridement  Treatment Response: 10x6.3x0.4 Post Debridement Measurements L x W x D (cm) 19.792 Post Debridement Volume: (cm) Excoriation: No Periwound Skin Texture: No Abnormalities Noted Periwound Skin Moisture: Erythema: Yes Periwound Skin Color: Circumferential  Erythema Location: No Abnormality Temperature: Compression Therapy Procedures Performed: Debridement] [6:Serous amber Distinct, outline attached Small (1-33%) Pink Small (1-33%) Fascia: No Tendon: No Muscle: No Joint: No Bone: No Large (67-100%) N/A N/A  N/A N/A N/A N/A N/A N/A N/A N/A N/A N/A N/A Excoriation: No No Abnormalities Noted Rubor: Yes Erythema: No N/A No Abnormality N/A] [N/A:[id number] Page 4 of 9 N/A N/A N/A N/A N/A N/A N/A N/A N/A N/A N/A N/A N/A N/A N/A N/A N/A  N/A N/A N/A N/A N/A N/A N/A N/A N/A N/A] Treatment Notes Electronic Signature(s) Signed: 03/04/2023 12:18:17 PM By: Duanne Guess MD FACS Signed: 03/04/2023 12:54:25 PM By: Zenaida Deed RN, BSN Entered By: Duanne Guess on 03/04/2023 12:18:17 -------------------------------------------------------------------------------- Multi-Disciplinary Care Plan Details Patient Name:  Date of Service: [name], [name] 03/04/2023 11:00 A M Medical Record Number: [id number] Patient Account Number: [id number] Date of Birth/Sex: Treating RN: 12/25/1950 (72 y.o. [name] Primary Care Regginald Pask: [name] Other Clinician: Referring Samule Life: Treating Bobie Kistler/Extender: [name] in Treatment: 6 Multidisciplinary Care Plan reviewed with physician Active Inactive Venous Leg Ulcer Nursing Diagnoses: Knowledge deficit related to disease process and  management Potential for venous Insuffiency (use before diagnosis confirmed) Goals: Patient will maintain optimal edema control Date Initiated: 02/17/2022 Target Resolution Date: 03/23/2023 Goal Status: Active Interventions: ORELL, PALOMARES ([id number]) [id number] Page 5 of 9 Assess peripheral edema status every visit. Compression as ordered Provide education on venous insufficiency Treatment Activities: Non-invasive vascular studies : 02/17/2022 Therapeutic compression applied : 02/17/2022 Notes: Wound/Skin Impairment Nursing Diagnoses: Impaired tissue integrity Knowledge deficit related to ulceration/compromised skin integrity Goals: Patient/caregiver will verbalize understanding of skin care regimen Date Initiated: 02/17/2022 Target Resolution Date: 03/23/2023 Goal Status: Active Ulcer/skin breakdown will have a volume reduction of 30% by week 4 Date Initiated: 02/17/2022 Date Inactivated: 03/10/2022 Target Resolution Date: 03/10/2022 Unmet Reason: infection being treated, Goal Status: Unmet waiting on derm appte Ulcer/skin breakdown will have a volume reduction of 50% by week 8 Date Initiated: 03/10/2022 Date Inactivated: 04/14/2022 Target Resolution Date: 04/07/2022 Goal Status: Unmet Unmet Reason: calcinosis Ulcer/skin breakdown will have a volume reduction of 80% by week 12 Date Initiated: 04/14/2022 Date Inactivated: 05/05/2022 Target Resolution Date: 05/05/2022 Unmet Reason: infection, chronic Goal Status: Unmet condition Interventions: Assess patient/caregiver ability to obtain necessary supplies Assess patient/caregiver ability to perform ulcer/skin care regimen upon admission and as needed Assess ulceration(s) every visit Provide education on ulcer and skin care Treatment Activities: Skin care regimen initiated : 02/17/2022 Topical wound management initiated : 02/17/2022 Notes: Electronic Signature(s) Signed: 03/04/2023 12:54:25 PM By:  Zenaida Deed RN, BSN Entered By: Zenaida Deed on 03/04/2023 11:49:27 -------------------------------------------------------------------------------- Pain Assessment Details Patient Name: Date of Service: [name] 03/04/2023 11:00 A M Medical Record Number: [id number] Patient Account Number: [id number] Date of Birth/Sex: Treating RN: 08-24-1950 (72 y.o. M) Primary Care Cebert Dettmann: [name] Other Clinician: Referring Lindsay Straka: Treating Beyounce Dickens/Extender: [name] in Treatment: 32 Active Problems Location of Pain Severity and Description of Pain Patient Has Paino No Site Locations Rate the pain. [name], [name] ([id number]) [id number] Page 6 of 9 Rate the pain. Current Pain Level: 0 Pain Management and Medication Current Pain Management: Electronic Signature(s) Signed: 03/04/2023 12:54:25 PM By: Zenaida Deed RN, BSN Previous Signature: 03/04/2023 11:28:00 AM Version By: [place] Scrape Entered By: Zenaida Deed on 03/04/2023 11:43:25 -------------------------------------------------------------------------------- Patient/Caregiver Education Details Patient Name: Date of Service: [name], [name] 8/9/2024andnbsp11:00 A M Medical Record Number: [id number] Patient Account Number: [id number] Date of Birth/Gender: Treating RN: January 14, 1951 (72 y.o. [name] Primary Care Physician: [name] Other Clinician: Referring Physician: Treating Physician/Extender: [name] in Treatment: 13 Education Assessment Education Provided To: Patient Education Topics Provided Venous: Methods: Explain/Verbal Responses: Reinforcements needed, State content correctly Wound/Skin Impairment: Methods: Explain/Verbal Responses: Reinforcements needed, State content correctly Electronic Signature(s) Signed: 03/04/2023 12:54:25 PM By: Zenaida Deed RN, BSN Entered By: Zenaida Deed on 03/04/2023  11:50:07 Wound Assessment Details -------------------------------------------------------------------------------- [name] ([id number]) [id number] Page 7 of 9 Patient Name: Date of Service: [name], [name] 03/04/2023 11:00 A M Medical Record Number: [id number] Patient Account Number: [id number] Date of Birth/Sex: Treating RN: 1951/03/22 (72 y.o. [name] Primary Care Kalan Rinn: [name] Other Clinician: Referring Xaden Kaufman: Treating Joshuwa Vecchio/Extender: Laure Kidney  Weeks in Treatment: 55 Wound Status Wound Number: 3 Primary Lymphedema Etiology: Wound Location: Right, Medial Lower Leg Wound Open Wounding Event: Gradually Appeared Status: Date Acquired: 12/07/2021 Comorbid Cataracts, Anemia, Lymphedema, Congestive Heart Failure, Deep Weeks Of Treatment: 55 History: Vein Thrombosis, Hypertension, Peripheral Venous Disease Clustered Wound: No Wound Measurements Length: (cm) 10 % Reduction in Area: -826.4% Width: (cm) 6.3 % Reduction in Volume: -826.6% Depth: (cm) 0.4 Epithelialization: Small (1-33%) Area: (cm) 49.48 Tunneling: No Volume: (cm) 19.792 Undermining: No Wound Description Classification: Full Thickness With Exposed Support Structures Foul Odor After Cleansing: No Wound Margin: Indistinct, nonvisible Slough/Fibrino Yes Exudate Amount: Large Exudate Type: Serosanguineous Exudate Color: red, brown Wound Bed Granulation Amount: Large (67-100%) Exposed Structure Granulation Quality: Red Fascia Exposed: No Necrotic Amount: Small (1-33%) Fat Layer (Subcutaneous Tissue) Exposed: Yes Necrotic Quality: Adherent Slough Tendon Exposed: No Muscle Exposed: No Joint Exposed: No Bone Exposed: No Periwound Skin Texture Texture Color No Abnormalities Noted: No No Abnormalities Noted: No Excoriation: No Erythema: Yes Erythema Location: Circumferential Moisture No Abnormalities Noted: Yes Temperature /  Pain Temperature: No Abnormality Treatment Notes Wound #3 (Lower Leg) Wound Laterality: Right, Medial Cleanser Soap and Water Discharge Instruction: May shower and wash wound with dial antibacterial soap and water prior to dressing change. Vashe 5.8 (oz) Discharge Instruction: Cleanse the wound with Vashe prior to applying a clean dressing using gauze sponges, not tissue or cotton balls. Peri-Wound Care Sween Lotion (Moisturizing lotion) Discharge Instruction: Apply moisturizing lotion as directed zinc unna Discharge Instruction: cut strips to place on excoriated areas Topical Gentamicin Discharge Instruction: mixed 50:50 with mupirocin to wound bed Mupirocin Ointment Discharge Instruction: Apply Mupirocin (Bactroban) as instructed Primary Dressing UrgoClean AG 4 X 5 (in/in) Discharge Instruction: Apply directly to wound bed on proximal part of wound area [place], [name] ([id number]) [id number] Page 8 of 9 Secondary Dressing ABD Pad, 8x10 Discharge Instruction: Apply over primary dressing as directed. Zetuvit Plus 4x8 in Discharge Instruction: Apply over primary dressing as directed. Secured With Compression Wrap ThreePress (3 layer compression wrap) Discharge Instruction: Apply three layer compression as directed. Compression Stockings Add-Ons Electronic Signature(s) Signed: 03/04/2023 12:54:25 PM By: Zenaida Deed RN, BSN Previous Signature: 03/04/2023 11:28:00 AM Version By: [place] Scrape Entered By: Zenaida Deed on 03/04/2023 11:48:40 -------------------------------------------------------------------------------- Wound Assessment Details Patient Name: Date of Service: [name], [name] 03/04/2023 11:00 A M Medical Record Number: [id number] Patient Account Number: [id number] Date of Birth/Sex: Treating RN: 1951-01-17 (72 y.o. [name] Primary Care Ane Conerly: [name] Other Clinician: Referring Doxie Augenstein: Treating Alexander Mcauley/Extender:  [name] in Treatment: 55 Wound Status Wound Number: 6 Primary Calcinosis Etiology: Wound Location: Right, Anterior Lower Leg Wound Open Wounding Event: Gradually Appeared Status: Date Acquired: 05/12/2022 Comorbid Cataracts, Anemia, Lymphedema, Congestive Heart Failure, Deep Weeks Of Treatment: 42 History: Vein Thrombosis, Hypertension, Peripheral Venous Disease Clustered Wound: Yes Wound Measurements Length: (cm) Width: (cm) Depth: (cm) Clustered Quantity: Area: (cm) Volume: (cm) 1.6 % Reduction in Area: -113.1% 0.4 % Reduction in Volume: -320.8% 0.2 Epithelialization: Large (67-100%) 2 Tunneling: No 0.503 Undermining: No 0.101 Wound Description Classification: Full Thickness Without Exposed Sup Wound Margin: Distinct, outline attached Exudate Amount: Medium Exudate Type: Serous Exudate Color: amber port Structures Foul Odor After Cleansing: No Slough/Fibrino Yes Wound Bed Granulation Amount: Small (1-33%) Exposed Structure Granulation Quality: Pink Fascia Exposed: No Necrotic Amount: Small (1-33%) Fat Layer (Subcutaneous Tissue) Exposed: Yes Tendon Exposed: No Muscle Exposed: No Joint Exposed: No Bone Exposed: No Periwound Skin Texture Texture Color No Abnormalities Noted: Yes  No Abnormalities Noted: No Erythema: No Moisture SHMAYA, MACEK ([id number]) [id number] Page 9 of 9 Rubor: Yes No Abnormalities Noted: Yes Temperature / Pain Temperature: No Abnormality Treatment Notes Wound #6 (Lower Leg) Wound Laterality: Right, Anterior Cleanser Peri-Wound Care Sween Lotion (Moisturizing lotion) Discharge Instruction: Apply moisturizing lotion as directed Topical Gentamicin Discharge Instruction: As directed by physician Mupirocin Ointment Discharge Instruction: Apply Mupirocin (Bactroban) as instructed Primary Dressing UrgoClean AG 4 X 5 (in/in) Discharge Instruction: Apply directly to wound  bed. Secondary Dressing Woven Gauze Sponge, Non-Sterile 4x4 in Discharge Instruction: Apply over primary dressing as directed. Secured With Compression Wrap ThreePress (3 layer compression wrap) Discharge Instruction: Apply three layer compression as directed. Compression Stockings Add-Ons Electronic Signature(s) Signed: 03/04/2023 12:54:25 PM By: Zenaida Deed RN, BSN Previous Signature: 03/04/2023 11:28:00 AM Version By: [place] Scrape Entered By: Zenaida Deed on 03/04/2023 11:49:10 -------------------------------------------------------------------------------- Vitals Details Patient Name: Date of Service: [name] 03/04/2023 11:00 A M Medical Record Number: [id number] Patient Account Number: [id number] Date of Birth/Sex: Treating RN: 04/17/1951 (72 y.o. M) Primary Care Deseree Zemaitis: [name] Other Clinician: Referring Chris Cripps: Treating Leslie Jester/Extender: [name] in Treatment: 62 Vital Signs Time Taken: 11:06 Temperature (F): 97.8 Height (in): 71 Pulse (bpm): 73 Weight (lbs): 267 Respiratory Rate (breaths/min): 20 Body Mass Index (BMI): 37.2 Blood Pressure (mmHg): 143/70 Reference Range: 80 - 120 mg / dl Electronic Signature(s) Signed: 03/04/2023 12:54:25 PM By: Zenaida Deed RN, BSN Previous Signature: 03/04/2023 11:28:00 AM Version By: [place] Scrape Entered By: Zenaida Deed on 03/04/2023 11:43:03

## 2023-03-04 NOTE — Progress Notes (Signed)
[facility] [phone]   [phone]  PROGRESS NOTE  Patient Care Team: Jeoffrey Massed, MD as PCP - General (Family Medicine) Pricilla Riffle, MD as PCP - Cardiology (Cardiology) Pricilla Riffle, MD as Consulting Physician (Cardiology) [name] Lot, MD as Consulting Physician (Dermatology)  Hematological/Oncological History 1) 01/02/2021: Presented to ED in [place] due to swelling and redness of the legs x 8 days.  Doppler US revealed nonocclusive deep venous thrombus involving the left common femoral vein, proximal femoral vein and popliteal vein extending to the calf trifurcation. Stared Eliquis therapy  2) 01/29/2022: Hypercoagulable panel ordered and did not show underlying clotting disorder.   3) 03/03/2022: Establish care with [facility] Hematology with Dr. Jeanie Sewer and Georga Kaufmann PA-C  CHIEF COMPLAINTS/PURPOSE OF CONSULTATION:  Hx of DVT currently on Eliquis  HISTORY OF PRESENTING ILLNESS:  [name] 72 y.o. adult returns for a follow up for history of DVT and macrocytic anemia/thrombocytopenia. He is unaccompanied for this visit.   On exam today, [name] reports he has been feeling "crummy" in the interim since her last visit.  He notes this is mostly been due to his muscle disease, the inclusion body myositis.  He reports that there is no treatment or no cure for this.  He reports that he has some difficulty with mechanical tasks including holding a fork and walking.  He is not having any lightheadedness, dizziness, or shortness of breath.  He denies any overt signs of bleeding such as nosebleeds, gum bleeding, or dark stools.  He reports he is eating well.  He notes that he has not had any signs or symptoms concerning for a recurrent blood clot.  He denies any chest pain, shortness of breath, or leg swelling/leg pain.  He has he continues taking his Eliquis 2.5 mg twice daily with no difficulty.  He notes that his energy levels are generally pretty  good he recently started taking Benadryl in order to help himself sleep better.  He denies fevers, chills, night sweats, shortness of breath, chest pain or cough. He has no other complaints. Rest of the 10 point ROS is below.    MEDICAL HISTORY:  Past Medical History:  Diagnosis Date   Adenomatous colon polyp - diminutive 06/2019   no recall needed   Bilateral lower extremity edema    Coronary artery disease    a. s/p BMS to LAD (1998) b. s/p DES to RCA (06/24/14)   DVT (deep venous thrombosis) (HCC)    12/2020 L femoral and poplitea.  Unprovoked.  hypercoag w/u NEG.   Eliquis.   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 ?inclusion body myositis? Power WC as of 2022   Macrocytic anemia    Estab with cone hem/onc 08/2022   Osteoarthritis, multiple sites    Palatal fistula    Palpable abd. aorta    Aortic u/s NO ANEURISM 2020   RBBB    Spinal stenosis    Dr Louanna Raw  surgery   Thrombocytopenia [facility])     SURGICAL HISTORY: Past Surgical History:  Procedure Laterality Date   APPENDECTOMY  2010   CERVICAL FUSION  09/2009   CHOLECYSTECTOMY  1998   COLONOSCOPY     2020-->no recall   EYE SURGERY     Lasik   HYDROCELE EXCISION  2005   LEFT HEART CATHETERIZATION WITH CORONARY ANGIOGRAM N/A 06/24/2014   Procedure: LEFT HEART CATHETERIZATION WITH CORONARY ANGIOGRAM;  Surgeon: Lennette Bihari, MD;  Location: [facility] CATH LAB;  Service: Cardiovascular;  Laterality: N/A;   LUMBAR SPINE SURGERY  11/2011   MUSCLE BIOPSY Left 07/11/2018   Procedure: Left Vastus lateralus muscle biopsy;  Surgeon: Jadene Pierini, MD;  Location: [facility] OR;  Service: Neurosurgery;  Laterality: Left;  Left Vastus lateralus muscle biopsy   PERCUTANEOUS CORONARY  STENT INTERVENTION (PCI-S)  06/24/2014   DES to RCA. Procedure: PERCUTANEOUS CORONARY STENT INTERVENTION (PCI-S);  Surgeon: Lennette Bihari, MD;  Location: [facility] CATH LAB;  Service: Cardiovascular;;  Distal RCA   TONSILLECTOMY AND ADENOIDECTOMY  2012   TOTAL HIP ARTHROPLASTY Right 2021   RIGHT   TRANSTHORACIC ECHOCARDIOGRAM     03/2019 normal    SOCIAL HISTORY: Social History   Socioeconomic History   Marital status: Married    Spouse name: Not on file   Number of children: Not on file   Years of education: Not on file   Highest education level: Not on file  Occupational History   Not on file  Tobacco Use   Smoking status: Former    Current packs/day: 0.00    Average packs/day: 0.8 packs/day for 23.0 years (17.3 ttl pk-yrs)    Types: Cigarettes    Start date: 07/26/1966    Quit date: 07/26/1989    Years since quitting: 33.6   Smokeless tobacco: Never  Vaping Use   Vaping status: Never Used  Substance and Sexual Activity   Alcohol use: Yes    Comment: 3-6 cans per week   Drug use: No   Sexual activity: Yes    Partners: Female    Birth control/protection: None    Comment: married  Other Topics Concern   Not on file  Social History Narrative   Married, 2 kids (one is Teacher, early years/pre at [employer]).   Educ: HS   Retired Dance movement psychotherapist   Patient is a former smoker, quit age 26   Alcohol use - yes, hx beer drinker, has approx 6 pack per week   Drug Use- no    caffeine 3 cups coffee daily   Enjoys motorcycles   Social Determinants of Health   Financial Resource Strain: Low Risk  (12/22/2022)   Overall Financial Resource Strain (CARDIA)    Difficulty of Paying Living Expenses: Not hard at all  Food Insecurity: No Food Insecurity (12/22/2022)   Hunger Vital Sign    Worried About Running Out of Food in the Last Year: Never true    Ran Out of Food in the Last Year: Never true  Transportation Needs: No Transportation Needs (12/22/2022)   PRAPARE - Scientist, research (physical sciences) (Medical): No    Lack of Transportation (Non-Medical): No  Physical Activity: Inactive (12/22/2022)   Exercise Vital Sign    Days of Exercise per Week: 0 days    Minutes of Exercise per Session: 0 min  Stress: No Stress Concern Present (12/22/2022)   [employer] of Occupational Health - Occupational Stress Questionnaire    Feeling of Stress : Not at all  Social Connections: Moderately Isolated (12/22/2022)   Social Connection and Isolation Panel [NHANES]    Frequency of Communication with Friends and Family: Once a  week    Frequency of Social Gatherings with Friends and Family: Three times a week    Attends Religious Services: Never    Active Member of Clubs or Organizations: No    Attends Banker Meetings: Never    Marital Status: Married  Catering manager Violence: Not At Risk (12/22/2022)   Humiliation, Afraid, Rape, and Kick questionnaire    Fear of Current or Ex-Partner: No    Emotionally Abused: No    Physically Abused: No    Sexually Abused: No    FAMILY HISTORY: Family History  Problem Relation Age of Onset   Heart attack Mother    Esophageal cancer Mother    Aneurysm Father    Heart disease Father    Cystic fibrosis Brother        no prior health issues, rare form of cystic fibrosis of the lungs (aug 2016)   Stroke Neg Hx     ALLERGIES:  has No Known Allergies.  MEDICATIONS:  Current Outpatient Medications  Medication Sig Dispense Refill   acetaminophen (TYLENOL) 500 MG tablet Take by mouth as needed for moderate pain or mild pain.     apixaban (ELIQUIS) 2.5 MG TABS tablet Take 1 tablet (2.5 mg total) by mouth 2 (two) times daily. 180 tablet 1   atenolol (TENORMIN) 25 MG tablet Take 1 tablet (25 mg total) by mouth 2 (two) times daily. 180 tablet 3   b complex vitamins tablet Take 1 tablet by mouth daily.     cholecalciferol (VITAMIN D) 1000 units tablet Take 1,000 Units by mouth daily.     ciprofloxacin (CIPRO) 500 MG tablet Take  500 mg by mouth 2 (two) times daily.     Coenzyme Q10 (CO Q 10) 100 MG CAPS Take 100 mg by mouth daily.      ezetimibe (ZETIA) 10 MG tablet Take 1 tablet (10 mg total) by mouth daily. 90 tablet 3   folic acid (FOLVITE) 800 MCG tablet Take 800 mcg by mouth daily.      furosemide (LASIX) 40 MG tablet Take 1 tablet (40 mg total) by mouth daily as needed for edema. 90 tablet 1   glucosamine-chondroitin 500-400 MG tablet Take 2 tablets by mouth daily.      ibuprofen (ADVIL,MOTRIN) 800 MG tablet Take 800 mg by mouth daily.      Krill Oil 500 MG CAPS Take 500 mg by mouth daily.      lisinopril (ZESTRIL) 10 MG tablet Take 1 tablet (10 mg total) by mouth daily. 90 tablet 3   Melatonin 10 MG TABS Take 20 mg by mouth at bedtime.     potassium chloride (K-DUR) 10 MEQ tablet TAKE 1 TABLET BY MOUTH FOR 3 DAYS THEN TAKE ONLY AS NEEDED WHEN YOU TAKE A LASIX 90 tablet 3   rosuvastatin (CRESTOR) 5 MG tablet Take 1 tablet (5 mg total) by mouth daily. 90 tablet 3   Saw Palmetto, Serenoa repens, (SAW PALMETTO PO) Take 300 mg by mouth daily.     triamcinolone cream (KENALOG) 0.1 % Apply 1 application topically as directed. (Patient not taking: Reported on 12/22/2022) 30 g 3   No current facility-administered medications for this visit.    REVIEW OF SYSTEMS:   Constitutional: ( - ) fevers, ( - )  chills , ( - ) night sweats Eyes: ( - ) blurriness of vision, ( - ) double vision, ( - ) watery eyes Ears, nose, mouth, throat, and face: ( - ) mucositis, ( - )  sore throat Respiratory: ( - ) cough, ( - ) dyspnea, ( - ) wheezes Cardiovascular: ( - ) palpitation, ( - ) chest discomfort, ( + ) lower extremity swelling Gastrointestinal:  ( - ) nausea, ( - ) heartburn, ( - ) change in bowel habits Skin: ( - ) abnormal skin rashes Lymphatics: ( - ) new lymphadenopathy, ( - ) easy bruising Neurological: ( - ) numbness, ( - ) tingling, ( - ) new weaknesses Behavioral/Psych: ( - ) mood change, ( - ) new changes  All other  systems were reviewed with the patient and are negative.  PHYSICAL EXAMINATION: ECOG PERFORMANCE STATUS: 1 - Symptomatic but completely ambulatory  There were no vitals filed for this visit.  There were no vitals filed for this visit.   GENERAL: well appearing male in NAD  SKIN: skin color, texture, turgor are normal, no rashes or significant lesions EYES: conjunctiva are pink and non-injected, sclera clear LUNGS: clear to auscultation and percussion with normal breathing effort HEART: regular rate & rhythm and no murmurs. Bilateral lower extremity edema, wrapped so unable to assess ulcerations.  ABDOMEN: soft, non-tender, non-distended, normal bowel sounds Musculoskeletal: no cyanosis of digits and no clubbing  PSYCH: alert & oriented x 3, fluent speech NEURO: no focal motor/sensory deficits  LABORATORY DATA:  I have reviewed the data as listed    Latest Ref Rng & Units 09/03/2022    2:15 PM 03/12/2022   10:57 AM 03/03/2022   12:19 PM  CBC  WBC 4.0 - 10.5 K/uL 5.0  5.8  5.2   Hemoglobin 13.0 - 17.0 g/dL 81.1  91.4  78.2   Hematocrit 39.0 - 52.0 % 36.3  37.5  36.8   Platelets 150 - 400 K/uL 107  115  104        Latest Ref Rng & Units 09/03/2022    2:15 PM 04/05/2022   12:25 PM 03/12/2022   10:57 AM  CMP  Glucose 70 - 99 mg/dL 956  68  87   BUN 8 - 23 mg/dL 27  21  37   Creatinine 0.61 - 1.24 mg/dL 2.13  0.86  5.78   Sodium 135 - 145 mmol/L 139  139  138   Potassium 3.5 - 5.1 mmol/L 4.0  4.7  4.8   Chloride 98 - 111 mmol/L 103  102  104   CO2 22 - 32 mmol/L 31  24  31    Calcium 8.9 - 10.3 mg/dL 9.0  9.3  9.2   Total Protein 6.5 - 8.1 g/dL 6.5   6.4   Total Bilirubin 0.3 - 1.2 mg/dL 0.3   0.5   Alkaline Phos 38 - 126 U/L 48   45   AST 15 - 41 U/L 21   18   ALT 0 - 44 U/L 15   13     RADIOGRAPHIC STUDIES: I have personally reviewed the radiological images as listed and agreed with the findings in the report. No results found.  ASSESSMENT & PLAN [name] is a 72 y.o.  adult returns for a follow up for history of DVT while on Eliquis therapy.   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 Recommend to continue --patient denies any bleeding, bruising, or dark stools on this medication. It is well tolerated. No difficulties accessing/affording the medication --RTC in 6 months' time with strict return precautions for overt signs of bleeding.   #  Macrocytic anemia #Thrombocytopenia: --Stable for the last 2-3 years. --Workup from 03/12/2022 ruled out nutritional deficiencies, hepatitis B/C, HIV -- labs today show WBC 5.7, Hgb 12.7, MCV 103.7, Plt 111 --Abdominal US from 03/25/2022 showed mild fatty liver otherwise no other liver disease or splenomegaly --Discussed monitoring versus proceeding with bone marrow biopsy.Patient would like to monitor for now since levels have been stable for some time. --Consider bone marrow biopsy if Hgb <10 or Plt <100K  No orders of the defined types were placed in this encounter.   All questions were answered. The patient knows to call the clinic with any problems, questions or concerns.  I have spent a total of 30 minutes minutes of face-to-face and non-face-to-face time, preparing to see the patient, obtaining and/or reviewing separately obtained history, performing a medically appropriate examination, counseling and educating the patient, documenting clinical information in the electronic health record and care coordination.   Ulysees Barns, MD Department of Hematology/Oncology [facility] at [facility] Phone: [phone] Pager: [phone] Email: [email or web address]

## 2023-03-08 ENCOUNTER — Encounter (HOSPITAL_BASED_OUTPATIENT_CLINIC_OR_DEPARTMENT_OTHER): Payer: Medicare Other | Admitting: Internal Medicine

## 2023-03-08 DIAGNOSIS — L97821 Non-pressure chronic ulcer of other part of left lower leg limited to breakdown of skin: Secondary | ICD-10-CM | POA: Diagnosis not present

## 2023-03-08 DIAGNOSIS — L97812 Non-pressure chronic ulcer of other part of right lower leg with fat layer exposed: Secondary | ICD-10-CM | POA: Diagnosis not present

## 2023-03-08 DIAGNOSIS — L942 Calcinosis cutis: Secondary | ICD-10-CM | POA: Diagnosis not present

## 2023-03-08 DIAGNOSIS — L97521 Non-pressure chronic ulcer of other part of left foot limited to breakdown of skin: Secondary | ICD-10-CM | POA: Diagnosis not present

## 2023-03-08 DIAGNOSIS — I872 Venous insufficiency (chronic) (peripheral): Secondary | ICD-10-CM | POA: Diagnosis not present

## 2023-03-08 DIAGNOSIS — L98492 Non-pressure chronic ulcer of skin of other sites with fat layer exposed: Secondary | ICD-10-CM | POA: Diagnosis not present

## 2023-03-08 NOTE — Progress Notes (Signed)
CALIB, WEGLEY ([id number]) [id number] Page 1 of 1 Visit Report for 03/08/2023 SuperBill Details Patient Name: Date of Service: [name], [name] 03/08/2023 Medical Record Number: [id number] Patient Account Number: [id number] Date of Birth/Sex: Treating RN: [birth year] (72 y.o. 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

## 2023-03-08 NOTE — Progress Notes (Signed)
GENEVIEVE, VIZZINI ([id number]) [id number] Page 1 of 5 Visit Report for 03/08/2023 Arrival Information Details Patient Name: Date of Service: ELMORE, STOPHEL 03/08/2023 11:00 A M Medical Record Number: [id number] Patient Account Number: [id number] Date of Birth/Sex: Treating RN: 1951/05/28 (72 y.o. [name] Primary Care Aniruddh Ciavarella: [name] Other Clinician: Referring Jyra Lagares: Treating Deshay Kirstein/Extender: Debe Coder in Treatment: 63 Visit Information History Since Last Visit Added or deleted any medications: No Patient Arrived: Wheel Chair Any new allergies or adverse reactions: No Arrival Time: 11:43 Had a fall or experienced change in Yes Accompanied By: self activities of daily living that may affect Transfer Assistance: None risk of falls: Patient Identification Verified: Yes Signs or symptoms of abuse/neglect since last visito No Secondary Verification Process Completed: Yes Hospitalized since last visit: No Patient Requires Transmission-Based Precautions: No Implantable device outside of the clinic excluding No Patient Has Alerts: Yes cellular tissue based products placed in the center Patient Alerts: R ABI= N/C, TBI= .75 since last visit: L ABI =N/C, TBI = .57 Has Dressing in Place as Prescribed: Yes Has Compression in Place as Prescribed: Yes Pain Present Now: No Electronic Signature(s) Signed: 03/08/2023 4:35:59 PM By: Zenaida Deed RN, BSN Entered By: Zenaida Deed on 03/08/2023 11:43:42 -------------------------------------------------------------------------------- Compression Therapy Details Patient Name: Date of Service: [name] 03/08/2023 11:00 A M Medical Record Number: [id number] Patient Account Number: [id number] Date of Birth/Sex: Treating RN: 10/17/1950 (72 y.o. [name] Primary Care Jalynn Waddell: [name] Other Clinician: Referring Mariangel Ringley: Treating Karlye Ihrig/Extender:  Debe Coder in Treatment: 33 Compression Therapy Performed for Wound Assessment: Wound #3 Right,Medial Lower Leg Performed By: Clinician Zenaida Deed, RN Compression Type: Three Emergency planning/management officer) Signed: 03/08/2023 4:35:59 PM By: Zenaida Deed RN, BSN Entered By: Zenaida Deed on 03/08/2023 11:46:14 -------------------------------------------------------------------------------- Encounter Discharge Information Details Patient Name: Date of Service: [name] 03/08/2023 11:00 A M Medical Record Number: [id number] Patient Account Number: [id number] DAYLIN, VOGLER ([id number]) [phone].pdf Page 2 of 5 Date of Birth/Sex: Treating RN: 24-Jul-1951 (72 y.o. [name] Primary Care Erric Machnik: Other Clinician: [name] Referring [name] [name]: Treating Vannie Hochstetler/Extender: Debe Coder in Treatment: 3 Encounter Discharge Information Items Discharge Condition: Stable Ambulatory Status: Wheelchair Discharge Destination: Home Transportation: Private Auto Accompanied By: self Schedule Follow-up Appointment: Yes Clinical Summary of Care: Patient Declined Electronic Signature(s) Signed: 03/08/2023 4:35:59 PM By: Zenaida Deed RN, BSN Entered By: Zenaida Deed on 03/08/2023 11:48:41 -------------------------------------------------------------------------------- Patient/Caregiver Education Details Patient Name: Date of Service: Fielder, [name] 8/13/2024andnbsp11:00 A M Medical Record Number: [id number] Patient Account Number: [id number] Date of Birth/Gender: Treating RN: [birth year] (72 y.o. [name] Primary Care Physician: [name] Other Clinician: Referring Physician: Treating Physician/Extender: Debe Coder in Treatment: 34 Education Assessment Education Provided To: Patient Education Topics Provided Infection: Methods:  Explain/Verbal Responses: Reinforcements needed, State content correctly Venous: Methods: Explain/Verbal Responses: Reinforcements needed, State content correctly Wound/Skin Impairment: Methods: Explain/Verbal Responses: Reinforcements needed, State content correctly Electronic Signature(s) Signed: 03/08/2023 4:35:59 PM By: Zenaida Deed RN, BSN Entered By: Zenaida Deed on 03/08/2023 11:48:19 -------------------------------------------------------------------------------- Wound Assessment Details Patient Name: Date of Service: [name] 03/08/2023 11:00 A M Medical Record Number: [id number] Patient Account Number: [id number] Date of Birth/Sex: Treating RN: 1951/07/17 (72 y.o. [name] Primary Care Jacquese Cassarino: [name] Other Clinician: [name] ([id number]) [id number] Page 3 of 5 Referring Illias Pantano: Treating Jerris Fleer/Extender: Debe Coder in Treatment: 56 Wound Status Wound Number: 3 Primary Lymphedema Etiology: Wound Location:  Right, Medial Lower Leg Wound Open Wounding Event: Gradually Appeared Status: Date Acquired: 12/07/2021 Comorbid Cataracts, Anemia, Lymphedema, Congestive Heart Failure, Deep Weeks Of Treatment: 56 History: Vein Thrombosis, Hypertension, Peripheral Venous Disease Clustered Wound: No Wound Measurements Length: (cm) 10.4 Width: (cm) 6.3 Depth: (cm) 0.4 Area: (cm) 51.459 Volume: (cm) 20.584 % Reduction in Area: -863.5% % Reduction in Volume: -863.7% Epithelialization: Small (1-33%) Tunneling: No Undermining: No Wound Description Classification: Full Thickness With Exposed Suppo Wound Margin: Indistinct, nonvisible Exudate Amount: Large Exudate Type: Serous Exudate Color: amber rt Structures Foul Odor After Cleansing: No Slough/Fibrino Yes Wound Bed Granulation Amount: Large (67-100%) Exposed Structure Granulation Quality: Red Fascia Exposed: No Necrotic Amount:  Small (1-33%) Fat Layer (Subcutaneous Tissue) Exposed: Yes Necrotic Quality: Adherent Slough Tendon Exposed: No Muscle Exposed: No Joint Exposed: No Bone Exposed: No Periwound Skin Texture Texture Color No Abnormalities Noted: No No Abnormalities Noted: No Excoriation: Yes Erythema: No Moisture Temperature / Pain No Abnormalities Noted: Yes Temperature: No Abnormality Assessment Notes yellow/green drainage Treatment Notes Wound #3 (Lower Leg) Wound Laterality: Right, Medial Cleanser Soap and Water Discharge Instruction: May shower and wash wound with dial antibacterial soap and water prior to dressing change. Vashe 5.8 (oz) Discharge Instruction: Cleanse the wound with Vashe prior to applying a clean dressing using gauze sponges, not tissue or cotton balls. Peri-Wound Care Ketoconazole Cream 2% Discharge Instruction: Apply Ketoconazole as directed Triamcinolone 15 (g) Discharge Instruction: Use triamcinolone 15 (g) as directed Sween Lotion (Moisturizing lotion) Discharge Instruction: Apply moisturizing lotion as directed Topical Gentamicin Discharge Instruction: mixed 50:50 with mupirocin to wound bed Mupirocin Ointment Discharge Instruction: Apply Mupirocin (Bactroban) as instructed Primary Dressing Maxorb Extra Ag+ Alginate Dressing, 4x4.75 (in/in) Discharge Instruction: Apply to wound bed as instructed AZAVION, BROMMER ([id number]) [id number] Page 4 of 5 UrgoClean AG 4 X 5 (in/in) Discharge Instruction: Apply directly to wound bed on proximal part of wound area Secondary Dressing ABD Pad, 8x10 Discharge Instruction: Apply over primary dressing as directed. Zetuvit Plus 4x8 in Discharge Instruction: Apply over primary dressing as directed. Secured With Compression Wrap ThreePress (3 layer compression wrap) Discharge Instruction: Apply three layer compression as directed. Compression Stockings Add-Ons Electronic Signature(s) Signed:  03/08/2023 4:35:59 PM By: Zenaida Deed RN, BSN Entered By: Zenaida Deed on 03/08/2023 11:45:06 -------------------------------------------------------------------------------- Wound Assessment Details Patient Name: Date of Service: KAILE, KYGER 03/08/2023 11:00 A M Medical Record Number: [id number] Patient Account Number: [id number] Date of Birth/Sex: Treating RN: [birth year] (72 y.o. [name] Primary Care Chiquitta Matty: [name] Other Clinician: Referring Fronnie Urton: Treating Beaumont Austad/Extender: Debe Coder in Treatment: 56 Wound Status Wound Number: 6 Primary Calcinosis Etiology: Wound Location: Right, Anterior Lower Leg Wound Open Wounding Event: Gradually Appeared Status: Date Acquired: 05/12/2022 Comorbid Cataracts, Anemia, Lymphedema, Congestive Heart Failure, Deep Weeks Of Treatment: 42 History: Vein Thrombosis, Hypertension, Peripheral Venous Disease Clustered Wound: Yes Wound Measurements Length: (cm) Width: (cm) Depth: (cm) Clustered Quantity: Area: (cm) Volume: (cm) 1.6 % Reduction in Area: -113.1% 0.4 % Reduction in Volume: -320.8% 0.2 Epithelialization: Large (67-100%) 2 Tunneling: No 0.503 Undermining: No 0.101 Wound Description Classification: Full Thickness Without Exposed Sup Wound Margin: Distinct, outline attached Exudate Amount: Medium Exudate Type: Serous Exudate Color: amber port Structures Foul Odor After Cleansing: No Slough/Fibrino Yes Wound Bed Granulation Amount: Small (1-33%) Exposed Structure Granulation Quality: Pink Fascia Exposed: No Necrotic Amount: Small (1-33%) Fat Layer (Subcutaneous Tissue) Exposed: Yes Necrotic Quality: Adherent Slough Tendon Exposed: No Muscle Exposed: No Joint Exposed: No Bone Exposed: No Periwound Skin Texture  [address] KAELEB, PURNELL ([id number]) [id number] Page 5 of 5 No Abnormalities Noted: Yes No Abnormalities Noted:  No Erythema: No Moisture Rubor: No No Abnormalities Noted: Yes Temperature / Pain Temperature: No Abnormality Treatment Notes Wound #6 (Lower Leg) Wound Laterality: Right, Anterior Cleanser Peri-Wound Care Sween Lotion (Moisturizing lotion) Discharge Instruction: Apply moisturizing lotion as directed Topical Primary Dressing UrgoClean AG 4 X 5 (in/in) Discharge Instruction: Apply directly to wound bed. Secondary Dressing Woven Gauze Sponge, Non-Sterile 4x4 in Discharge Instruction: Apply over primary dressing as directed. Secured With Compression Wrap ThreePress (3 layer compression wrap) Discharge Instruction: Apply three layer compression as directed. Compression Stockings Add-Ons Electronic Signature(s) Signed: 03/08/2023 4:35:59 PM By: Zenaida Deed RN, BSN Entered By: Zenaida Deed on 03/08/2023 11:45:38

## 2023-03-09 NOTE — Progress Notes (Signed)
ROMUALD, SHA ([id number]) [id number] Page 1 of 15 Visit Report for 03/04/2023 Chief Complaint Document Details Patient Name: Date of Service: RUTH, ENGELHARD 03/04/2023 11:00 A M Medical Record Number: [id number] Patient Account Number: [id number] Date of Birth/Sex: Treating RN: 1951/07/03 (72 y.o. [name] Primary Care Provider: [name] Other Clinician: Referring Provider: Treating Provider/Extender: [name] in Treatment: 65 Information Obtained from: Patient Chief Complaint Patient seen for complaints of Non-Healing Wounds. Electronic Signature(s) Signed: 03/04/2023 12:18:24 PM By: Duanne Guess MD FACS Entered By: Duanne Guess on 03/04/2023 12:18:24 -------------------------------------------------------------------------------- Debridement Details Patient Name: Date of Service: [name] 03/04/2023 11:00 A M Medical Record Number: [id number] Patient Account Number: [id number] Date of Birth/Sex: Treating RN: 08-Feb-1951 (71 y.o. [name], [name] Primary Care Provider: [name] Other Clinician: Referring Provider: Treating Provider/Extender: [name] in Treatment: 55 Debridement Performed for Assessment: Wound #3 Right,Medial Lower Leg Performed By: Physician Duanne Guess, MD Debridement Type: Debridement Level of Consciousness (Pre-procedure): Awake and Alert Pre-procedure Verification/Time Out Yes - 11:55 Taken: Start Time: 11:55 Pain Control: Lidocaine 4% T opical Solution Percent of Wound Bed Debrided: 30% T Area Debrided (cm): otal 14.84 Tissue and other material debrided: Non-Viable, Slough, Slough Level: Non-Viable Tissue Debridement Description: Selective/Open Wound Instrument: Curette Bleeding: Minimum Hemostasis Achieved: Pressure Procedural Pain: 0 Post Procedural Pain: 0 Response to Treatment: Procedure was tolerated well Level of  Consciousness (Post- Awake and Alert procedure): Post Debridement Measurements of Total Wound Length: (cm) 10 Width: (cm) 6.3 Depth: (cm) 0.4 Volume: (cm) 19.792 Character of Wound/Ulcer Post Debridement: Improved Post Procedure Diagnosis SASAN, BUNYARD ([id number]) [id number] Page 2 of 15 Same as Pre-procedure [name], RN Electronic Signature(s) Signed: 03/04/2023 12:32:47 PM By: Duanne Guess MD FACS Signed: 03/04/2023 12:54:25 PM By: Zenaida Deed RN, BSN Entered By: Zenaida Deed on 03/04/2023 12:01:07 -------------------------------------------------------------------------------- HPI Details Patient Name: Date of Service: ROMANDO, MELAHN [name] 03/04/2023 11:00 A M Medical Record Number: [id number] Patient Account Number: [id number] Date of Birth/Sex: Treating RN: 23-Nov-1950 (72 y.o. [name] Primary Care Provider: [name] Other Clinician: Referring Provider: Treating Provider/Extender: [name] in Treatment: 21 History of Present Illness HPI Description: ADMISSION 02/09/2022 This is a 72 year old man who recently moved to [place] from [place]. He has a history of of coronary artery disease and prior left popliteal vein DVT . He is not diabetic and does not smoke. 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 He has had multiple spinal surgeries and has limited mobility. He primarily uses a motorized wheelchair. He has had lymphedema for many years and does have lymphedema pumps although he says he does not use them frequently. He has wounds on his bilateral lower extremities. He was receiving care in [place] for these prior to his move. They were apparently packing his wounds with Betadine soaked gauze. He has worn compression wraps in the past but not recently. He did say that they helped tremendously. In clinic today, his right ABI is noncompressible but  has good signals; the left ABI was 1.17. No formal vascular studies have been performed. On his left dorsal foot, there is a circular lesion that exposes the fat layer. There is fairly heavy slough accumulation within the wound, but no significant odor. On his right medial calf, he has multiple pinholes that have slough buildup in them and probe for about 2 to 4 mm in depth. They are draining clear fluid.  On his right lateral midfoot, he has ulceration consistent with venous stasis. It is geographic and has slough accumulation. 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 02/17/2022: All of the wounds are little bit larger today and have more slough accumulation. Today, the medial calf openings are larger and probe to something that feels calcified. There is odor coming from his wounds. His vascular studies are scheduled for August 9 and 10. 02-24-2022 upon evaluation today patient presents for follow-up concerning his ongoing issues here with his lower extremities. With that being said he does have significant problems with what appears to be cellulitis unfortunately the PCR culture that was sent last week got crushed by [employer] in transit. With that being said we are going to reobtain a culture today although I am actually to do it on the right medial leg as this seems to be the most inflamed area today where there is some questionable drainage as well. I am going to remove some of the calcium deposits which are loosening obtain a deeper culture from this area. Fortunately I do not see any evidence of active infection systemically which is good news but locally I think we are having a bigger issue here. He does have his appointment next Thursday with vascular. Patient has also been referred to [facility] for further evaluation and treatment of the calcinosis for possible sulfate injections. 03/04/2022: The culture that  was performed last week grew out multiple species including Klebsiella, Morganella, and Pseudomonas aeruginosa. He had been prescribed Bactrim but this was not adequate to cover all of the species and levofloxacin was recommended. He completed the Bactrim but did not pick up the levofloxacin and begin taking it until yesterday. 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 He had arterial studies performed today. He does have evidence of peripheral arterial disease. Results copied here: +-------+-----------+-----------+------------+------------+ ABI/TBIT oday's ABIT oday's TBIPrevious ABIPrevious TBI +-------+-----------+-----------+------------+------------+ Right [place] 0.75    +-------+-----------+-----------+------------+------------+ Left [place] 0.57    +-------+-----------+-----------+------------+------------+ Arterial wall calcification precludes accurate ankle pressures and ABIs. Summary: Right: Resting right ankle-brachial index indicates noncompressible right lower extremity arteries. The right toe-brachial index is normal. Left: Resting left ankle-brachial index indicates noncompressible left lower extremity arteries. The left toe-brachial index is abnormal. He continues to have further tissue breakdown at the right medial calf and there is ample slough accumulation along with necrotic subcutaneous tissue. The left dorsal foot wound has built up slough, as well. The right medial foot wound is nearly closed with just a light layer of eschar over the surface. The right dorsal ZAIAH, RUA ([id number]) [id number] Page 3 of 15 lateral foot wound has also accumulated a fair amount of slough and remains quite tender. 03/10/2022: He has been taking the prescribed levofloxacin and has about 3  days left of this. The erythema and induration on his right leg has improved. He continues to experience further breakdown of the right medial calf wound. There is extensive slough and debris accumulation there. There is heavy slough on his left dorsal foot wound, but no odor or purulent drainage. The right medial foot wound appears to be closed. The right dorsal lateral foot wound also has a fair amount of slough but it is less tender than at our last visit. He still does not have an appointment  with dermatology. 03/22/2022: The right anterior tibial wound has closed. The right lateral foot wound is nearly closed with just a little bit of slough. The left dorsal foot wound is smaller and continues to have some slough buildup but less so than at prior visits. There is good granulation tissue underlying the slough. Unfortunately the right medial calf wound continues to deteriorate. It has a foul odor today and a lot of necrotic tissue, the surrounding skin is also erythematous and moist. 03/30/2022: The right lateral foot wound continues to contract and just has a little bit of slough and eschar present. The left dorsal foot wound is also smaller with slough overlying good granulation tissue. The right medial calf wound actually looks quite a bit better today; there is no odor and there is much less necrotic tissue although some remains present. We have been using topical gentamicin and he has been taking oral Augmentin. 04/07/2022: The patient saw dermatology at [facility] last week. Unfortunately, it appears that they did not read any of the documentation that was provided. 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 Overall, it was a  highly unsatisfactory consultation. In the meantime, however, we were able to identify compounding pharmacy who could create a topical sodium thiosulfate ointment. The patient has that with him today. Both of the right foot wounds are now closed. The left dorsal foot wound has contracted but still has a layer of slough on the surface. The medial calf wounds on the right all look a little bit better. They still have heavy slough along with the chunks of calcium. Everything is stained purple. 04/14/2022: The wound on his dorsal left foot is a little bit smaller again today. There is still slough accumulation on the surface. The medial calf wounds have quite a bit less slough accumulation today. His right leg, however, is warm and erythematous, concerning for cellulitis. 04/14/2022: He completed his course of Augmentin yesterday. The medial calf wound sites are much cleaner today and shallower. There is less fragmented calcium in the wounds. He has a lot of lymphedema fluid-like drainage from these wounds, but no purulent discharge or malodor. The wound on his dorsal left foot has also contracted and is shallower. There is a layer of slough over healthy granulation tissue. 05/05/2022: The left dorsal foot wound is smaller today with less slough and more granulation tissue. The right medial calf wounds are shallower, but have extensive slough and some fat necrosis. The drainage has diminished considerably, however. 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 05/12/2022: The left dorsal foot wound continues to contract and fill with granulation tissue. There is slough on the wound surface. The right medial calf wounds also continue to fill with healthy tissue, but there is remaining slough and fat necrosis present. He had a significant increase in his drainage this week and it was a bright  yellow-green color. He also had a new wound open over his right anterior tibial surface associated with the spike of cutaneous calcium. The leg is more red, as well, concerning for infection. 05/19/2022: His culture returned positive for Pseudomonas. He is currently taking a course of  oral levofloxacin. We have also been using topical gentamicin mixed in with his sodium thiosulfate. All of the wounds look better this week. 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 There is slough and nonviable subcutaneous tissue still present. The new anterior tibial wound just has a light layer of slough. The dorsal foot wound also has some slough present and appears a little dry today. 05/26/2022: The right medial leg wound continues to improve. It is feeling with granulation tissue, but still accumulates a fairly thick layer of slough on the surface. The more recent anterior tibial wound has remained quite small, but also has some slough on the surface. The left dorsal foot wound has contracted somewhat and has perimeter epithelialization. He completed his oral levofloxacin. 06/02/2022: The left dorsal foot wound continues to improve significantly. There is just a little slough on the wound surface. The right medial leg wound had black drainage, but it looks like silver alginate was applied last week and I theorized that silver sulfide was created with a combination of the silver alginate and the sodium thiosulfate. It did, however, have a bit of an odor to it and extensive slough accumulation. The small anterior tibial wound also had a bit of slough and nonviable subcutaneous tissue present. The skin of his leg was rather macerated, as well. 06/09/2022: The left dorsal foot wound is smaller again this week with just a light layer of slough on the surface. The right medial leg wound looks substantially better. The leg and periwound skin are no longer red and macerated. There is good  granulation tissue forming with less slough and nonviable tissue. The anterior tibial wound just has a small amount of slough accumulation. 06/16/2022: The left dorsal foot wound continues to contract. His wrap slipped a little bit, however, and his foot is more swollen. The right medial leg wound continues to improve. The underlying tissue is more vital-appearing and there is less slough. He does have substantial drainage. The small anterior tibial wound has a bit of slough accumulation. 06/23/2022: The left dorsal foot wound is about half the size as it was last week. There is just some light slough on the surface and some eschar buildup around the edges. The right anterior leg wound is small with a little slough on the surface. Unfortunately, the right medial leg wound deteriorated fairly substantially. It is malodorous with gray/green/black discoloration. 06/30/2022: The left dorsal foot wound continues to contract. It is nearly closed with just a light layer of slough present. The right anterior leg wound is about the same size and has a small amount of slough on the surface. The right medial leg wound looks a lot better than it did last week. The odor has abated and the tissue is less pulpy and necrotic-looking. There is still a fairly thick layer of slough present. 12/13; the left dorsal foot wound looks very healthy. There was no need to change the dressing here and no debridement. He has a small area on the right anterior lower leg apparently had not changed much in size. The area on the medial leg is the most problematic area this is large with some depth and a completely nonviable surface today. 07/14/2022: The left dorsal foot wound is smaller today with just a little slough and eschar present. The right anterior lower leg wound is about the same without any significant change. The medial leg wound has a black and green surface and is malodorous. No purulent drainage and  the periwound skin  is intact. Edema control is suboptimal. 07/21/2022: The dorsal foot wound continues to contract and is nearly closed with just a little bit of slough and eschar on the surface. The right anterior lower leg wound is shallower today but otherwise the dimensions are unchanged. The medial leg wound looks significantly better although it still has a nonviable surface; the odor and black/green surface has resolved. His culture came back with multiple species sensitive to Augmentin, which was prescribed and a very low level of Pseudomonas, which should be handled by the topical gentamicin we have been using. 07/28/2022: The dorsal foot wound is down to just a very tiny opening with a little eschar on the surface. The right anterior tibial wound is about the same with some slough buildup. The right medial leg wound looks quite a bit better this week. There is thick rubbery slough on the surface, but the odor and drainage has improved significantly. 08/04/2022: The dorsal foot wound is almost healed, but the periwound skin has broken down. This appears to be secondary to moisture and adhesive from the absorbent pad. The right anterior tibial wound is shallower. The right medial leg wound continues to improve. It is smaller, still with rubbery slough on the surface. No malodor or purulent drainage. 08/11/2022: The dorsal foot wound is about the same size. It is a little bit dry with some slough accumulation. The right anterior tibial wound is also unchanged. The right medial leg wound is filling in further with good granulation tissue. Still with some slough buildup on the surface. DARSHAWN, STEWART ([id number]) [id number] Page 4 of 15 08/18/2022: His left leg is bright red, hot, and swollen with cellulitis. The dorsal foot wound actually looks quite good and is superficial with just a little bit of slough accumulation. The right anterior tibial wound is unchanged. The right medial leg wound  has filled in with good granulation tissue but has copious amounts of drainage. 08/25/2022: His cellulitis has responded nicely to the oral antibiotics. His dorsal foot wound is smaller but has a fair amount of slough buildup. The right anterior tibial wound remains stable and unchanged. The right medial leg wound has more granulation tissue under a layer of slough, but continues to have copious drainage. The drainage is actually causing skin irritation and threatens to result in breakdown. 09/01/2022: He continues to have profuse drainage from his wounds which is resulting in tissue maceration on both the dorsum of his left foot and the periwound distal to his medial leg wound. The dorsal foot wound is nearly healed, despite this. The medial right leg wound is filling in with good granulation tissue. We are working on getting home health assistance to change his dressings more frequently to try and control the drainage better. 09/08/2022: We have had success in controlling his drainage. The tissue maceration has improved significantly and his swelling has come down quite markedly. The medial right leg wound is much more superficial and has substantially less nonviable tissue present. The anterior tibial wound is a little bit larger, however with some nonviable tissue present. 09/15/2022: The dorsal foot wound is nearly closed. The anterior tibial wound measures slightly larger today. The medial right leg wound continues to improve quite dramatically with less nonviable tissue and more robust-looking granulation. Edema control is significantly better. 09/22/2022: The left dorsal foot wound remains open, albeit quite tiny. The anterior tibial wound is a little bit larger again today. The medial right leg wound continues to improve with  an increase in robust and healthy granulation tissue and less accumulation of nonviable tissue. 09/29/2022: His home health providers wrapped his left leg extremely tightly and  he ended up having to cut the wrap off of his foot. This resulted in his foot being very swollen and that the dorsal foot wound is a little bit larger today. The anterior tibial wound is about the same size with a little slough on the surface. The medial right leg wound continues to fill in. There are very few areas with any significant depth. There is still slough accumulation. 3/13; Patient has a left dorsal foot wound along with a right anterior tibial wound and right medial wound. We are using silver alginate to the left dorsal foot wound, Santyl and sodium thiosulfate compound ointment to the right lower extremity wounds under 4 layer compression. Patient has no issues or complaints today. 10/13/2022: The left dorsal foot wound is a little bit bigger today, but his foot is also a little bit more swollen. The right medial leg wound has filled in more and has substantially less depth. There is still some nonviable tissue present. The small anterior lower leg wound looks about the same. 10/20/2022: The left dorsal foot wound is epithelializing. There does not appear to be much open at this site. There is some eschar around the edges. The right medial leg wound continues to fill in with granulation tissue. The small anterior lower leg wound is a little bit bigger and there is a chunk of calcium protruding. He continues to have fairly copious drainage from the right leg. 10/27/2022: His home health agency failed to show up at all this past week and as a result, the excessive drainage from his right leg wound has resulted in tissue breakdown. His leg is red and irritated. The wounds themselves look about the same. The wound on his left dorsal foot is nearly closed underneath some eschar. 11/03/2022: We continue to have difficulty with his home health agency. He continues to have significant drainage from his right leg wound which has resulted in even further tissue breakdown. He also was not taking his  Lasix as prescribed and I think much of the drainage has been his lymphedema fluid choosing the path of least resistance. The wound on his left dorsal foot is closed, but in the process of cleaning the site, dry skin was pulled off and he has a new small superficial wound just adjacent to where the dorsal foot wound was. 11/12/2022: His left dorsal foot is completely healed. The right medial leg continues to deteriorate. He is not taking his Lasix as prescribed, nor is he using his lymphedema pumps at all. As a result, he has massive amounts of drainage saturating his dressings and causing further tissue breakdown on his medial leg. Today, his dressings had a foul odor, as well. 11/19/2022: His right medial leg looks even worse today. There is more erythema and moisture related tissue breakdown. The 2 anterior wounds look a little bit better. The culture that I took last week did produce Pseudomonas and he is currently taking levofloxacin for this. 11/26/2022: There has been significant improvement to his right medial leg. It is far less raw and many of the superficial open areas have epithelialized. He continues to cut the foot portion of his wrap and as result his foot is bulbous and swollen compared to the rest of his leg, where edema control is good. The smaller of the 2 anterior tibial wound is closed, while  the larger is contracting somewhat with a bit of slough at the base. 12/03/2022: His leg continues to demonstrate improvement. There has been epithelialization of much of the denuded area. He has a new small anterior tibial wound that has opened around a nidus of calcium. Edema control is improved. 12/10/2022: Continued improvement in the periwound skin. The anterior tibial wounds are about the same, but the medial leg wound has improved further. There is less nonviable tissue present. 12/17/2022: Between his visit on Tuesday with the nurse and today, he has had significantly more drainage which has  resulted in some periwound tissue maceration. I suspect this correlates somewhat with his diuretic use, as he says he takes his diuretic religiously on Saturday, Sunday, and Monday and then does not take it after Tuesday due to being out and about and not being close to easy restroom access. I suspect the excess fluid is just simply draining out of his leg. 12/21/2022: This was supposed to be a nurse visit, but his leg has broken down so significantly, that we have converted to a wound care visit. He did take his diuretics over the weekend, but despite this, he has had copious drainage and all of his tissues have broken down substantially from the right medial leg ulcer all the way down to his ankle and even wrapping posterior to this. There is blue-green staining on his dressing, suggesting ongoing presence of Pseudomonas aeruginosa. 12/24/2022: The wound already looks better than it did 2 days ago. He has been taking ciprofloxacin and the Urgo clean dressing we applied did a much better job of wicking the drainage away from his leg. There is slough on the entire surface of the medial leg wound, but the erythema and induration has improved in the past couple of days. 12/31/2022: Continued improvement of the wound. He says that the pain has basically abated since being on ciprofloxacin. Still with fairly heavy drainage and slough accumulation. 01/07/2023: Overall continued improvement. Still with heavy slough buildup, but less drainage. 01/14/2023: There is a little bit of improvement. The skin is still looking a bit excoriated. He has a few more days left of ciprofloxacin. 01/21/2023: There has been significant improvement. The periwound skin is in much better condition. The ulcers are shallower and actually have some good granulation tissue present under some slough. 01/28/2023: The wound continues to improve. All of the open areas measured smaller today. There is slough accumulation on the surfaces. His  drainage has decreased markedly. 02/04/2023: Continued improvement in the wound. Drainage remains significantly diminished. Slough accumulation is present but to a lesser extent. NYSHAUN, JAQUET ([id number]) [id number] Page 5 of 15 skin continues to improve. Edema control is good. 02/18/2023: The heavy drainage persists, although it is was not as significant today as it was at his nurse visit on Tuesday. The drainage is yellow and does not have the blue-green discoloration suggestive of Pseudomonas. The actual wounds on his leg are getting smaller, but the periwound skin is quite excoriated. 02/25/2023: His periwound skin is quite excoriated and he has a lot of clear yellow drainage. The main wounds are filling in further and epithelializing. 03/04/2023: The excoriation of the periwound skin has improved. Most of that area has dried up. The main wounds have less slough in them today. Electronic Signature(s) Signed: 03/04/2023 12:20:25 PM By: Duanne Guess MD FACS Entered By: Duanne Guess on 03/04/2023 12:20:25 -------------------------------------------------------------------------------- Physical Exam Details Patient Name: Date of Service: JONLUKE, JINKINS [name] 03/04/2023 11:00 A M Medical Record  Number: [id number] Patient Account Number: [id number] Date of Birth/Sex: Treating RN: [birth year] (72 y.o. [name] Primary Care Provider: [name] Other Clinician: Referring Provider: Treating Provider/Extender: [name] in Treatment: 56 Constitutional Slightly hypertensive. . . . no acute distress. Respiratory Normal work of breathing on room air. Notes 03/04/2023: The excoriation of the periwound skin has improved. Most of that area has dried up. The main wounds have less slough in them today. Electronic Signature(s) Signed: 03/04/2023 12:21:07 PM By: Duanne Guess MD FACS Entered By: Duanne Guess on 03/04/2023  12:21:07 -------------------------------------------------------------------------------- Physician Orders Details Patient Name: Date of Service: [name] 03/04/2023 11:00 A M Medical Record Number: [id number] Patient Account Number: [id number] Date of Birth/Sex: Treating RN: [birth year] (72 y.o. 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 - Dr. [name] RM 1 Return A Friday 8/16 @ 11:15 am Nurse Visit: - Tuesday RM 2 8/13 @ 11:00 am Anesthetic (In clinic) Topical Lidocaine 4% applied to wound bed Bathing/ Shower/ Hygiene May shower with protection but do not get wound dressing(s) wet. Protect dressing(s) with water repellant cover (for example, large plastic bag) or a cast cover and may then take shower. Edema Control - Lymphedema / SCD / Other Lymphedema Pumps. Use Lymphedema pumps on leg(s) 2-3 times a day for 45-60 minutes. If wearing any wraps or hose, do not remove them. Continue exercising as instructed. Elevate legs to the level of the heart or above for 30 minutes daily and/or when sitting for 3-4 times a day throughout the day. - throughout the day Avoid standing for long periods of time. Compression stocking or Garment 20-30 mm/Hg pressure to: -  juxtalite left leg daily Other Edema Control Orders/Instructions: - kerlix/coban wrap to left leg while wife recovers from surgery Wound Treatment Wound #3 - Lower Leg Wound Laterality: Right, Medial Cleanser: Soap and Water 2 x Per Week/30 Days Discharge Instructions: May shower and wash wound with dial antibacterial soap and water prior to dressing change. Cleanser: Vashe 5.8 (oz) 2 x Per Week/30 Days Discharge Instructions: Cleanse the wound with Vashe prior to applying a clean dressing using gauze sponges, not tissue or cotton balls. Peri-Wound Care: Sween Lotion (Moisturizing lotion) 2 x Per Week/30 Days Discharge Instructions: Apply moisturizing lotion as directed Peri-Wound Care: zinc unna 2 x Per Week/30 Days Discharge Instructions: cut strips to place on excoriated areas Topical: Gentamicin 2 x Per Week/30 Days Discharge Instructions: mixed 50:50 with mupirocin to wound bed Topical: Mupirocin Ointment 2 x Per Week/30 Days Discharge Instructions: Apply Mupirocin (Bactroban) as instructed Prim Dressing: UrgoClean AG 4 X 5 (in/in) 2 x Per Week/30 Days ary Discharge Instructions: Apply directly to wound bed on proximal part of wound area Secondary Dressing: ABD Pad, 8x10 2 x Per Week/30 Days Discharge Instructions: Apply over primary dressing as directed. Secondary Dressing: Zetuvit Plus 4x8 in 2 x Per Week/30 Days Discharge Instructions: Apply over primary dressing as directed. Compression Wrap: ThreePress (3 layer compression  wrap) 2 x Per Week/30 Days Discharge Instructions: Apply three layer compression as directed. Wound #6 - Lower Leg Wound Laterality: Right, Anterior Peri-Wound Care: Sween Lotion (Moisturizing lotion) 2 x Per Week/30 Days Discharge Instructions: Apply moisturizing lotion as directed Topical: Gentamicin 2 x Per Week/30 Days Discharge Instructions: As directed by physician Topical: Mupirocin Ointment 2 x Per Week/30 Days Discharge Instructions: Apply  Mupirocin (Bactroban) as instructed Prim Dressing: UrgoClean AG 4 X 5 (in/in) 2 x Per Week/30 Days ary Discharge Instructions: Apply directly to wound bed. Secondary Dressing: Woven Gauze Sponge, Non-Sterile 4x4 in 2 x Per Week/30 Days Discharge Instructions: Apply over primary dressing as directed. Compression Wrap: ThreePress (3 layer compression wrap) 2 x Per Week/30 Days Discharge Instructions: Apply three layer compression as directed. Electronic Signature(s) Signed: 03/04/2023 12:32:47 PM By: Duanne Guess MD FACS Entered By: Duanne Guess on 03/04/2023 12:21:47 [name] ([id number]) [id number] Page 7 of 15 -------------------------------------------------------------------------------- Problem List Details Patient Name: Date of Service: TREVER, BOTT 03/04/2023 11:00 A M Medical Record Number: [id number] Patient Account Number: [id number] Date of Birth/Sex: Treating RN: 06/11/1951 (72 y.o. 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) 02/09/2022 No Yes I89.0 Lymphedema, not elsewhere classified 02/09/2022 No Yes I10 Essential (primary) hypertension 02/09/2022 No Yes I25.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 angina pectoris 02/09/2022 No Yes Z86.718 Personal history of other venous thrombosis and embolism 02/09/2022 No Yes Inactive Problems ICD-10 Code Description Active Date Inactive Date L97.512 Non-pressure chronic ulcer of other part of right foot with fat layer exposed 02/17/2022 02/17/2022 Resolved  Problems ICD-10 Code Description Active Date Resolved Date L97.522 Non-pressure chronic ulcer of 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 [name] Primary Care Provider: [name] Other Clinician: Referring Provider: Treating Provider/Extender: [name] in Treatment: 55 Subjective Chief Complaint Information obtained from Patient Patient seen for complaints of Non-Healing Wounds. History of Present Illness (HPI) ADMISSION 02/09/2022 This is a 72 year old man who recently moved to [place] from [place]. He has a history of of coronary artery disease and prior left popliteal vein DVT . He is not diabetic and does not smoke. 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 He has had multiple spinal surgeries and has limited mobility. He primarily uses a motorized wheelchair. He has had lymphedema for many years and does have lymphedema pumps although he says he does not use them frequently. He has wounds on his bilateral lower extremities. He was receiving care in [place] for these prior to his move. They were apparently packing his wounds with Betadine soaked gauze. He has worn compression wraps in the past but not recently. He did say that they helped tremendously. In clinic today, his right ABI is noncompressible but has good signals; the left ABI was 1.17. No formal vascular  studies have been performed. On his  left dorsal foot, there is a circular lesion that exposes the fat layer. There is fairly heavy slough accumulation within the wound, but no significant odor. On his right medial calf, he has multiple pinholes that have slough buildup in them and probe for about 2 to 4 mm in depth. They are draining clear fluid. On his right lateral midfoot, he has ulceration consistent with venous stasis. It is geographic and has slough accumulation. 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 02/17/2022: All of the wounds are little bit larger today and have more slough accumulation. Today, the medial calf openings are larger and probe to something that feels calcified. There is odor coming from his wounds. His vascular studies are scheduled for August 9 and 10. 02-24-2022 upon evaluation today patient presents for follow-up concerning his ongoing issues here with his lower extremities. With that being said he does have significant problems with what appears to be cellulitis unfortunately the PCR culture that was sent last week got crushed by [employer] in transit. With that being said we are going to reobtain a culture today although I am actually to do it on the right medial leg as this seems to be the most inflamed area today where there is some questionable drainage as well. I am going to remove some of the calcium deposits which are loosening obtain a deeper culture from this area. Fortunately I do not see any evidence of active infection systemically which is good news but locally I think we are having a bigger issue here. He does have his appointment next Thursday with vascular. Patient has also been referred to [facility] for further evaluation and treatment of the calcinosis for possible sulfate injections. 03/04/2022: The culture that was performed last week grew out multiple species including Klebsiella, Morganella, and  Pseudomonas aeruginosa. He had been prescribed Bactrim but this was not adequate to cover all of the species and levofloxacin was recommended. He completed the Bactrim but did not pick up the levofloxacin and begin taking it until yesterday. 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 He had arterial studies performed today. He does have evidence of peripheral arterial disease. Results copied here: +-------+-----------+-----------+------------+------------+ ABI/TBIT oday's ABIT oday's TBIPrevious ABIPrevious TBI +-------+-----------+-----------+------------+------------+ Right [place] 0.75    +-------+-----------+-----------+------------+------------+ Left [place] 0.57    +-------+-----------+-----------+------------+------------+ Arterial wall calcification precludes accurate ankle pressures and ABIs. Summary: Right: Resting right ankle-brachial index indicates noncompressible right lower extremity arteries. The right toe-brachial index is normal. Left: Resting left ankle-brachial index indicates noncompressible left lower extremity arteries. The left toe-brachial index is abnormal. He continues to have further tissue breakdown at the right medial calf and there is ample slough accumulation along with necrotic subcutaneous tissue. The left dorsal foot wound has built up slough, as well. The right medial foot wound is nearly closed with just a light layer of eschar over the surface. The right dorsal lateral foot wound has also accumulated a fair amount of slough and remains quite tender. 03/10/2022: He has been taking the prescribed levofloxacin and has about 3 days left of this. The erythema and induration on his right leg has improved. He continues to experience further breakdown of the right medial calf wound. There is  extensive slough and debris accumulation there.  There is heavy slough on his left dorsal foot wound, but no odor or purulent drainage. The right medial foot wound appears to be closed. The right dorsal lateral foot wound also has a fair amount of slough but it is less tender than at our last visit. He still does not have an appointment with dermatology. 03/22/2022: The right anterior tibial wound has closed. The right lateral foot wound is nearly closed with just a little bit of slough. The left dorsal foot wound is smaller and continues to have some slough buildup but less so than at prior visits. There is good granulation tissue underlying the slough. Unfortunately the right medial calf wound continues to deteriorate. It has a foul odor today and a lot of necrotic tissue, the surrounding skin is also erythematous and moist. BENJERMAN, BOEHRINGER ([id number]) [id number] Page 9 of 15 03/30/2022: The right lateral foot wound continues to contract and just has a little bit of slough and eschar present. The left dorsal foot wound is also smaller with slough overlying good granulation tissue. The right medial calf wound actually looks quite a bit better today; there is no odor and there is much less necrotic tissue although some remains present. We have been using topical gentamicin and he has been taking oral Augmentin. 04/07/2022: The patient saw dermatology at [facility] last week. Unfortunately, it appears that they did not read any of the documentation that was provided. 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 Overall, it was a highly unsatisfactory consultation. In the meantime, however, we were able to identify  compounding pharmacy who could create a topical sodium thiosulfate ointment. The patient has that with him today. Both of the right foot wounds are now closed. The left dorsal foot wound has contracted but still has a layer of slough on the surface. The medial calf wounds on the right all look a little bit better. They still have heavy slough along with the chunks of calcium. Everything is stained purple. 04/14/2022: The wound on his dorsal left foot is a little bit smaller again today. There is still slough accumulation on the surface. The medial calf wounds have quite a bit less slough accumulation today. His right leg, however, is warm and erythematous, concerning for cellulitis. 04/14/2022: He completed his course of Augmentin yesterday. The medial calf wound sites are much cleaner today and shallower. There is less fragmented calcium in the wounds. He has a lot of lymphedema fluid-like drainage from these wounds, but no purulent discharge or malodor. The wound on his dorsal left foot has also contracted and is shallower. There is a layer of slough over healthy granulation tissue. 05/05/2022: The left dorsal foot wound is smaller today with less slough and more granulation tissue. The right medial calf wounds are shallower, but have extensive slough and some fat necrosis. The drainage has diminished considerably, however. 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 05/12/2022: The left dorsal foot wound continues to contract and fill with granulation tissue. There is slough on the wound surface. The right medial calf wounds also continue to fill with healthy tissue, but there is remaining slough and fat necrosis  present. He had a significant increase in his drainage this week and it was a bright yellow-green color. He also had a new wound open over his right anterior tibial surface  associated with the spike of cutaneous calcium. The leg is more red, as well, concerning for infection. 05/19/2022: His culture returned positive for Pseudomonas. He is currently taking a course of oral levofloxacin. We have also been using topical gentamicin mixed in with his sodium thiosulfate. All of the wounds look better this week. 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 There is slough and nonviable subcutaneous tissue still present. The new anterior tibial wound just has a light layer of slough. The dorsal foot wound also has some slough present and appears a little dry today. 05/26/2022: The right medial leg wound continues to improve. It is feeling with granulation tissue, but still accumulates a fairly thick layer of slough on the surface. The more recent anterior tibial wound has remained quite small, but also has some slough on the surface. The left dorsal foot wound has contracted somewhat and has perimeter epithelialization. He completed his oral levofloxacin. 06/02/2022: The left dorsal foot wound continues to improve significantly. There is just a little slough on the wound surface. The right medial leg wound had black drainage, but it looks like silver alginate was applied last week and I theorized that silver sulfide was created with a combination of the silver alginate and the sodium thiosulfate. It did, however, have a bit of an odor to it and extensive slough accumulation. The small anterior tibial wound also had a bit of slough and nonviable subcutaneous tissue present. The skin of his leg was rather macerated, as well. 06/09/2022: The left dorsal foot wound is smaller again this week with just a light layer of slough on the surface. The right medial leg wound looks substantially better. The leg and periwound skin are no longer red and macerated. There is good granulation tissue forming with less slough and nonviable tissue. The anterior tibial  wound just has a small amount of slough accumulation. 06/16/2022: The left dorsal foot wound continues to contract. His wrap slipped a little bit, however, and his foot is more swollen. The right medial leg wound continues to improve. The underlying tissue is more vital-appearing and there is less slough. He does have substantial drainage. The small anterior tibial wound has a bit of slough accumulation. 06/23/2022: The left dorsal foot wound is about half the size as it was last week. There is just some light slough on the surface and some eschar buildup around the edges. The right anterior leg wound is small with a little slough on the surface. Unfortunately, the right medial leg wound deteriorated fairly substantially. It is malodorous with gray/green/black discoloration. 06/30/2022: The left dorsal foot wound continues to contract. It is nearly closed with just a light layer of slough present. The right anterior leg wound is about the same size and has a small amount of slough on the surface. The right medial leg wound looks a lot better than it did last week. The odor has abated and the tissue is less pulpy and necrotic-looking. There is still a fairly thick layer of slough present. 12/13; the left dorsal foot wound looks very healthy. There was no need to change the dressing here and no debridement. He has a small area on the right anterior lower leg apparently had not changed much in size. The area on the medial leg is the  most problematic area this is large with some depth and a completely nonviable surface today. 07/14/2022: The left dorsal foot wound is smaller today with just a little slough and eschar present. The right anterior lower leg wound is about the same without any significant change. The medial leg wound has a black and green surface and is malodorous. No purulent drainage and the periwound skin is intact. Edema control is suboptimal. 07/21/2022: The dorsal foot wound continues  to contract and is nearly closed with just a little bit of slough and eschar on the surface. The right anterior lower leg wound is shallower today but otherwise the dimensions are unchanged. The medial leg wound looks significantly better although it still has a nonviable surface; the odor and black/green surface has resolved. His culture came back with multiple species sensitive to Augmentin, which was prescribed and a very low level of Pseudomonas, which should be handled by the topical gentamicin we have been using. 07/28/2022: The dorsal foot wound is down to just a very tiny opening with a little eschar on the surface. The right anterior tibial wound is about the same with some slough buildup. The right medial leg wound looks quite a bit better this week. There is thick rubbery slough on the surface, but the odor and drainage has improved significantly. 08/04/2022: The dorsal foot wound is almost healed, but the periwound skin has broken down. This appears to be secondary to moisture and adhesive from the absorbent pad. The right anterior tibial wound is shallower. The right medial leg wound continues to improve. It is smaller, still with rubbery slough on the surface. No malodor or purulent drainage. 08/11/2022: The dorsal foot wound is about the same size. It is a little bit dry with some slough accumulation. The right anterior tibial wound is also unchanged. The right medial leg wound is filling in further with good granulation tissue. Still with some slough buildup on the surface. 08/18/2022: His left leg is bright red, hot, and swollen with cellulitis. The dorsal foot wound actually looks quite good and is superficial with just a little bit of slough accumulation. The right anterior tibial wound is unchanged. The right medial leg wound has filled in with good granulation tissue but has copious amounts of drainage. 08/25/2022: His cellulitis has responded nicely to the oral antibiotics. His dorsal  foot wound is smaller but has a fair amount of slough buildup. The right anterior tibial wound remains stable and unchanged. The right medial leg wound has more granulation tissue under a layer of slough, but continues to have copious drainage. The drainage is actually causing skin irritation and threatens to result in breakdown. 09/01/2022: He continues to have profuse drainage from his wounds which is resulting in tissue maceration on both the dorsum of his left foot and the periwound QUI, SIT ([id number]) [id number] Page 10 of 15 distal to his medial leg wound. The dorsal foot wound is nearly healed, despite this. The medial right leg wound is filling in with good granulation tissue. We are working on getting home health assistance to change his dressings more frequently to try and control the drainage better. 09/08/2022: We have had success in controlling his drainage. The tissue maceration has improved significantly and his swelling has come down quite markedly. The medial right leg wound is much more superficial and has substantially less nonviable tissue present. The anterior tibial wound is a little bit larger, however with some nonviable tissue present. 09/15/2022: The dorsal foot wound is nearly  closed. The anterior tibial wound measures slightly larger today. The medial right leg wound continues to improve quite dramatically with less nonviable tissue and more robust-looking granulation. Edema control is significantly better. 09/22/2022: The left dorsal foot wound remains open, albeit quite tiny. The anterior tibial wound is a little bit larger again today. The medial right leg wound continues to improve with an increase in robust and healthy granulation tissue and less accumulation of nonviable tissue. 09/29/2022: His home health providers wrapped his left leg extremely tightly and he ended up having to cut the wrap off of his foot. This resulted in his foot being  very swollen and that the dorsal foot wound is a little bit larger today. The anterior tibial wound is about the same size with a little slough on the surface. The medial right leg wound continues to fill in. There are very few areas with any significant depth. There is still slough accumulation. 3/13; Patient has a left dorsal foot wound along with a right anterior tibial wound and right medial wound. We are using silver alginate to the left dorsal foot wound, Santyl and sodium thiosulfate compound ointment to the right lower extremity wounds under 4 layer compression. Patient has no issues or complaints today. 10/13/2022: The left dorsal foot wound is a little bit bigger today, but his foot is also a little bit more swollen. The right medial leg wound has filled in more and has substantially less depth. There is still some nonviable tissue present. The small anterior lower leg wound looks about the same. 10/20/2022: The left dorsal foot wound is epithelializing. There does not appear to be much open at this site. There is some eschar around the edges. The right medial leg wound continues to fill in with granulation tissue. The small anterior lower leg wound is a little bit bigger and there is a chunk of calcium protruding. He continues to have fairly copious drainage from the right leg. 10/27/2022: His home health agency failed to show up at all this past week and as a result, the excessive drainage from his right leg wound has resulted in tissue breakdown. His leg is red and irritated. The wounds themselves look about the same. The wound on his left dorsal foot is nearly closed underneath some eschar. 11/03/2022: We continue to have difficulty with his home health agency. He continues to have significant drainage from his right leg wound which has resulted in even further tissue breakdown. He also was not taking his Lasix as prescribed and I think much of the drainage has been his lymphedema fluid  choosing the path of least resistance. The wound on his left dorsal foot is closed, but in the process of cleaning the site, dry skin was pulled off and he has a new small superficial wound just adjacent to where the dorsal foot wound was. 11/12/2022: His left dorsal foot is completely healed. The right medial leg continues to deteriorate. He is not taking his Lasix as prescribed, nor is he using his lymphedema pumps at all. As a result, he has massive amounts of drainage saturating his dressings and causing further tissue breakdown on his medial leg. Today, his dressings had a foul odor, as well. 11/19/2022: His right medial leg looks even worse today. There is more erythema and moisture related tissue breakdown. The 2 anterior wounds look a little bit better. The culture that I took last week did produce Pseudomonas and he is currently taking levofloxacin for this. 11/26/2022: There has been  significant improvement to his right medial leg. It is far less raw and many of the superficial open areas have epithelialized. He continues to cut the foot portion of his wrap and as result his foot is bulbous and swollen compared to the rest of his leg, where edema control is good. The smaller of the 2 anterior tibial wound is closed, while the larger is contracting somewhat with a bit of slough at the base. 12/03/2022: His leg continues to demonstrate improvement. There has been epithelialization of much of the denuded area. He has a new small anterior tibial wound that has opened around a nidus of calcium. Edema control is improved. 12/10/2022: Continued improvement in the periwound skin. The anterior tibial wounds are about the same, but the medial leg wound has improved further. There is less nonviable tissue present. 12/17/2022: Between his visit on Tuesday with the nurse and today, he has had significantly more drainage which has resulted in some periwound tissue maceration. I suspect this correlates somewhat  with his diuretic use, as he says he takes his diuretic religiously on Saturday, Sunday, and Monday and then does not take it after Tuesday due to being out and about and not being close to easy restroom access. I suspect the excess fluid is just simply draining out of his leg. 12/21/2022: This was supposed to be a nurse visit, but his leg has broken down so significantly, that we have converted to a wound care visit. He did take his diuretics over the weekend, but despite this, he has had copious drainage and all of his tissues have broken down substantially from the right medial leg ulcer all the way down to his ankle and even wrapping posterior to this. There is blue-green staining on his dressing, suggesting ongoing presence of Pseudomonas aeruginosa. 12/24/2022: The wound already looks better than it did 2 days ago. He has been taking ciprofloxacin and the Urgo clean dressing we applied did a much better job of wicking the drainage away from his leg. There is slough on the entire surface of the medial leg wound, but the erythema and induration has improved in the past couple of days. 12/31/2022: Continued improvement of the wound. He says that the pain has basically abated since being on ciprofloxacin. Still with fairly heavy drainage and slough accumulation. 01/07/2023: Overall continued improvement. Still with heavy slough buildup, but less drainage. 01/14/2023: There is a little bit of improvement. The skin is still looking a bit excoriated. He has a few more days left of ciprofloxacin. 01/21/2023: There has been significant improvement. The periwound skin is in much better condition. The ulcers are shallower and actually have some good granulation tissue present under some slough. 01/28/2023: The wound continues to improve. All of the open areas measured smaller today. There is slough accumulation on the surfaces. His drainage has decreased markedly. 02/04/2023: Continued improvement in the wound.  Drainage remains significantly diminished. Slough accumulation is present but to a lesser extent. Periwound skin continues to improve. Edema control is good. 02/18/2023: The heavy drainage persists, although it is was not as significant today as it was at his nurse visit on Tuesday. The drainage is yellow and does not have the blue-green discoloration suggestive of Pseudomonas. The actual wounds on his leg are getting smaller, but the periwound skin is quite excoriated. 02/25/2023: His periwound skin is quite excoriated and he has a lot of clear yellow drainage. The main wounds are filling in further and epithelializing. 03/04/2023: The excoriation of the periwound  skin has improved. Most of that area has dried up. The main wounds have less slough in them today. PRAYAG, STUECK ([id number]) [id number] Page 11 of 15 Patient History Information obtained from Patient, Chart. Family History Cancer - Mother, Heart Disease - Father,Mother, Hypertension - Father,Siblings, Lung Disease - Siblings, No family history of Diabetes, Hereditary Spherocytosis, Kidney Disease, Seizures, Stroke, Thyroid Problems, Tuberculosis. Social History Former smoker - quit 1991, Marital Status - Married, Alcohol Use - Moderate, Drug Use - No History, Caffeine Use - Daily - coffee. Medical History Eyes Patient has history of Cataracts - right eye removed Denies history of Glaucoma, Optic Neuritis Ear/Nose/Mouth/Throat Denies history of Chronic sinus problems/congestion, Middle ear problems Hematologic/Lymphatic Patient has history of Anemia - macrocytic, Lymphedema Cardiovascular Patient has history of Congestive Heart Failure, Deep Vein Thrombosis - left leg, Hypertension, Peripheral Venous Disease Endocrine Denies history of Type I Diabetes, Type II Diabetes Genitourinary Denies history of End Stage Renal Disease Integumentary (Skin) Denies history of History of Burn Oncologic Denies history  of Received Chemotherapy, Received Radiation Psychiatric Denies history of Anorexia/bulimia, Confinement Anxiety Hospitalization/Surgery History - cervical fusion. - lumbar surgery. - coronary stent placement. - lasik eye surgery. - hydrocele excision. Medical A Surgical History Notes nd Constitutional Symptoms (General Health) obesity Ear/Nose/Mouth/Throat hard of hearing Respiratory frozen diaphragm right Cardiovascular hyperlipidemia Musculoskeletal myositis, lumbar DDD, spinal stenosis, cervical facet joint syndrome Objective Constitutional Slightly hypertensive. no acute distress. Vitals Time Taken: 11:06 AM, Height: 71 in, Weight: 267 lbs, BMI: 37.2, Temperature: 97.8 F, Pulse: 73 bpm, Respiratory Rate: 20 breaths/min, Blood Pressure: 143/70 mmHg. Respiratory Normal work of breathing on room air. General Notes: 03/04/2023: The excoriation of the periwound skin has improved. Most of that area has dried up. The main wounds have less slough in them today. Integumentary (Hair, Skin) Wound #3 status is Open. Original cause of wound was Gradually Appeared. The date acquired was: 12/07/2021. The wound has been in treatment 55 weeks. The wound is located on the Right,Medial Lower Leg. The wound measures 10cm length x 6.3cm width x 0.4cm depth; 49.48cm^2 area and 19.792cm^3 volume. There is Fat Layer (Subcutaneous Tissue) exposed. There is no tunneling or undermining noted. There is a large amount of serosanguineous drainage noted. The wound margin is indistinct and nonvisible. There is large (67-100%) red granulation within the wound bed. There is a small (1-33%) amount of necrotic tissue within the wound bed including Adherent Slough. The periwound skin appearance had no abnormalities noted for moisture. The periwound skin appearance exhibited: Erythema. The periwound skin appearance did not exhibit: Excoriation. The surrounding wound skin color is noted with erythema which  is circumferential. Periwound temperature was noted as No Abnormality. Wound #6 status is Open. Original cause of wound was Gradually Appeared. The date acquired was: 05/12/2022. The wound has been in treatment 42 weeks. The wound is located on the Right,Anterior Lower Leg. The wound measures 1.6cm length x 0.4cm width x 0.2cm depth; 0.503cm^2 area and 0.101cm^3 volume. There is Fat Layer (Subcutaneous Tissue) exposed. There is no tunneling or undermining noted. There is a medium amount of serous drainage noted. The wound margin is distinct with the outline attached to the wound base. There is small (1-33%) pink granulation within the wound bed. There is a small (1-33%) amount of necrotic tissue within the wound bed. The periwound skin appearance had no abnormalities noted for texture. The periwound skin appearance had no abnormalities noted for moisture. The periwound skin appearance exhibited: Rubor. The periwound skin appearance did not  exhibit: Erythema. Periwound temperature was noted as No Abnormality. 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 There was a Selective/Open Wound Non-Viable Tissue Debridement with a total area of 14.84 sq cm performed by Duanne Guess, MD. With the following instrument(s): Curette to remove Non-Viable tissue/material. Material removed includes [facility] after achieving pain control using Lidocaine 4% Topical Solution. No specimens  were taken. A time out was conducted at 11:55, prior to the start of the procedure. A Minimum amount of bleeding was controlled with Pressure. The procedure was tolerated well with a pain level of 0 throughout and a pain level of 0 following the procedure. Post Debridement Measurements: 10cm length x 6.3cm width x 0.4cm depth; 19.792cm^3 volume. Character of Wound/Ulcer Post Debridement is improved. Post procedure Diagnosis Wound #3: Same as Pre-Procedure General Notes: Scribed for Dr. [name] by Zenaida Deed, RN. Pre-procedure diagnosis of Wound #3 is a Lymphedema located on the Right,Medial Lower Leg . There was a Three Layer Compression Therapy Procedure by Zenaida Deed, RN. Post procedure Diagnosis Wound #3: Same as Pre-Procedure There was a Three Layer Compression Therapy Procedure by Zenaida Deed, RN. Post procedure Diagnosis Wound #: Same as Pre-Procedure Plan Follow-up Appointments: Return Appointment in 1 week. - Dr. [name] RM 1 Friday 8/16 @ 11:15 am Nurse Visit: - Tuesday RM 2 8/13 @ 11:00 am Anesthetic: (In clinic) Topical Lidocaine 4% applied to wound bed Bathing/ Shower/ Hygiene: May shower with protection but do not get wound dressing(s) wet. Protect dressing(s) with water repellant cover (for example, large plastic bag) or a cast cover and may then take shower. Edema Control - Lymphedema / SCD / Other: Lymphedema Pumps. Use Lymphedema pumps on leg(s) 2-3 times a day for 45-60 minutes. If wearing any wraps or hose, do not remove them. Continue exercising as instructed. Elevate legs to the level of the heart or above for 30 minutes daily and/or when sitting for 3-4 times a day throughout the day. - throughout the day Avoid standing for long periods of time. Compression stocking or Garment 20-30 mm/Hg pressure to: - juxtalite left leg daily Other Edema Control Orders/Instructions: - kerlix/coban wrap to left leg while wife recovers from surgery WOUND #3: - Lower Leg  Wound Laterality: Right, Medial Cleanser: Soap and Water 2 x Per Week/30 Days Discharge Instructions: May shower and wash wound with dial antibacterial soap and water prior to dressing change. Cleanser: Vashe 5.8 (oz) 2 x Per Week/30 Days Discharge Instructions: Cleanse the wound with Vashe prior to applying a clean dressing using gauze sponges, not tissue or cotton balls. Peri-Wound Care: Sween Lotion (Moisturizing lotion) 2 x Per Week/30 Days Discharge Instructions: Apply moisturizing lotion as directed Peri-Wound Care: zinc unna 2 x Per Week/30 Days Discharge Instructions: cut strips to place on excoriated areas Topical: Gentamicin 2 x Per Week/30 Days Discharge Instructions: mixed 50:50 with mupirocin to wound bed Topical: Mupirocin Ointment 2 x Per Week/30 Days Discharge Instructions: Apply Mupirocin (Bactroban) as instructed Prim Dressing: UrgoClean AG 4 X 5 (in/in) 2 x Per Week/30 Days ary Discharge Instructions: Apply directly to wound bed on proximal part of wound  area Secondary Dressing: ABD Pad, 8x10 2 x Per Week/30 Days Discharge Instructions: Apply over primary dressing as directed. Secondary Dressing: Zetuvit Plus 4x8 in 2 x Per Week/30 Days Discharge Instructions: Apply over primary dressing as directed. Com pression Wrap: ThreePress (3 layer compression wrap) 2 x Per Week/30 Days Discharge Instructions: Apply three layer compression as directed. WOUND #6: - Lower Leg Wound Laterality: Right, Anterior Peri-Wound Care: Sween Lotion (Moisturizing lotion) 2 x Per Week/30 Days Discharge Instructions: Apply moisturizing lotion as directed Topical: Gentamicin 2 x Per Week/30 Days Discharge Instructions: As directed by physician Topical: Mupirocin Ointment 2 x Per Week/30 Days Discharge Instructions: Apply Mupirocin (Bactroban) as instructed REINOLD, FOGLEMAN ([id number]) [id number] Page 13 of 15 Prim Dressing: UrgoClean AG 4 X 5 (in/in) 2 x Per Week/30  Days ary Discharge Instructions: Apply directly to wound bed. Secondary Dressing: Woven Gauze Sponge, Non-Sterile 4x4 in 2 x Per Week/30 Days Discharge Instructions: Apply over primary dressing as directed. Com pression Wrap: ThreePress (3 layer compression wrap) 2 x Per Week/30 Days Discharge Instructions: Apply three layer compression as directed. 03/04/2023: The excoriation of the periwound skin has improved. Most of that area has dried up. The main wounds have less slough in them today. I used a curette to debride the slough from the open wound sites. We will continue the mixture of topical gentamicin and mupirocin with the ergo clean dressing. We will also apply strips of zinc Unna boot to the excoriated areas of his skin. Continue 3 layer compression wrap. He may be a good candidate for the new peel and place wound VAC system from [employer], so we will run his insurance to see if it might be covered. He will follow-up for a nurse visit early next week and see me again a week from today. Electronic Signature(s) Signed: 03/04/2023 12:23:34 PM By: Duanne Guess MD FACS Entered By: Duanne Guess on 03/04/2023 12:23:34 -------------------------------------------------------------------------------- HxROS Details Patient Name: Date of Service: [name] 03/04/2023 11:00 A M Medical Record Number: [id number] Patient Account Number: [id number] Date of Birth/Sex: Treating RN: 01-14-1951 (72 y.o. [name] Primary Care Provider: [name] Other Clinician: Referring Provider: Treating Provider/Extender: [name] in Treatment: 73 Information Obtained From Patient Chart Constitutional Symptoms (General Health) Medical History: Past Medical History Notes: obesity Eyes Medical History: Positive for: Cataracts - right eye removed Negative for: Glaucoma; Optic Neuritis Ear/Nose/Mouth/Throat Medical History: Negative for: Chronic sinus  problems/congestion; Middle ear problems Past Medical History Notes: hard of hearing Hematologic/Lymphatic Medical History: Positive for: Anemia - macrocytic; Lymphedema Respiratory Medical History: Past Medical History Notes: frozen diaphragm right Cardiovascular Medical History: Positive for: Congestive Heart Failure; Deep Vein Thrombosis - left leg; Hypertension; Peripheral Venous Disease Past Medical History Notes: hyperlipidemia BAYLIE, STRUMPF ([id number]) [id number] Page 14 of 15 Endocrine Medical History: Negative for: Type I Diabetes; Type II Diabetes Genitourinary Medical History: Negative for: End Stage Renal Disease Integumentary (Skin) Medical History: Negative for: History of Burn Musculoskeletal Medical History: Past Medical History Notes: myositis, lumbar DDD, spinal stenosis, cervical facet joint syndrome Oncologic Medical History: Negative for: Received Chemotherapy; Received Radiation Psychiatric Medical History: Negative for: Anorexia/bulimia; Confinement Anxiety HBO Extended History Items Eyes: Cataracts Immunizations Pneumococcal Vaccine: Received Pneumococcal Vaccination: Yes Received Pneumococcal Vaccination On or After 60th Birthday: Yes Implantable Devices No devices added Hospitalization / Surgery History Type of Hospitalization/Surgery cervical fusion lumbar surgery coronary stent placement lasik eye surgery hydrocele excision Family and Social History Cancer: Yes - Mother; Diabetes: No; Heart Disease: Yes -  Father,Mother; Hereditary Spherocytosis: No; Hypertension: Yes - Father,Siblings; Kidney Disease: No; Lung Disease: Yes - Siblings; Seizures: No; Stroke: No; Thyroid Problems: No; Tuberculosis: No; Former smoker - quit 1991; Marital Status - Married; Alcohol Use: Moderate; Drug Use: No History; Caffeine Use: Daily - coffee; Financial Concerns: No; Food, Clothing or Shelter Needs: No; Support System  Lacking: No; Transportation Concerns: No Psychologist, prison and probation services) Signed: 03/04/2023 12:32:47 PM By: Duanne Guess MD FACS Signed: 03/04/2023 12:54:25 PM By: Zenaida Deed RN, BSN Entered By: Duanne Guess on 03/04/2023 12:20:43 -------------------------------------------------------------------------------- SuperBill Details Patient Name: Date of Service: LANDYN, LAPRISE 03/04/2023 Medical Record Number: [id number] Patient Account Number: [id number] Date of Birth/Sex: Treating RN: 04-Aug-1950 (72 y.o. [name] [place], [place] ([id number]) [id number] Page 15 of 15 Primary Care Provider: [name] Other Clinician: Referring Provider: Treating Provider/Extender: [name] in Treatment: 55 Diagnosis Coding ICD-10 Codes Code Description [phone] 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) I89.0 Lymphedema, not elsewhere classified I10 Essential (primary) hypertension I25.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 angina pectoris Z86.718 Personal history of other venous thrombosis and embolism Facility Procedures : CPT4 Code: 33295188 Description: 97597 - DEBRIDE WOUND 1ST 20 SQ CM OR < ICD-10 Diagnosis Description 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ed Modifier: Quantity: 1 : CPT4 Code: 41660630 Description: (Facility Use Only) 29581LT - APPLY MULTLAY COMPRS LWR LT LEG Modifier: 59 Quantity: 1 Physician Procedures : CPT4 Code Description Modifier [id number] - WC PHYS LEVEL 3 - EST PT 25 ICD-10 Diagnosis Description 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) I89.0 Lymphedema, not elsewhere  classified Quantity: 1 : [id number] 97597 - WC PHYS DEBR WO ANESTH 20 SQ CM ICD-10 Diagnosis Description 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ed Quantity: 1 Electronic Signature(s) Signed: 03/04/2023 12:54:25 PM By: Zenaida Deed RN, BSN Signed: 03/09/2023 7:39:52 AM By: Duanne Guess MD FACS Previous Signature: 03/04/2023 12:23:55 PM Version By: Duanne Guess MD FACS Entered By: Zenaida Deed on 03/04/2023 12:48:07

## 2023-03-11 ENCOUNTER — Encounter (HOSPITAL_BASED_OUTPATIENT_CLINIC_OR_DEPARTMENT_OTHER): Payer: Medicare Other | Admitting: General Surgery

## 2023-03-11 DIAGNOSIS — L97812 Non-pressure chronic ulcer of other part of right lower leg with fat layer exposed: Secondary | ICD-10-CM | POA: Diagnosis not present

## 2023-03-11 DIAGNOSIS — L98492 Non-pressure chronic ulcer of skin of other sites with fat layer exposed: Secondary | ICD-10-CM | POA: Diagnosis not present

## 2023-03-11 DIAGNOSIS — I872 Venous insufficiency (chronic) (peripheral): Secondary | ICD-10-CM | POA: Diagnosis not present

## 2023-03-11 DIAGNOSIS — L942 Calcinosis cutis: Secondary | ICD-10-CM | POA: Diagnosis not present

## 2023-03-11 DIAGNOSIS — L97821 Non-pressure chronic ulcer of other part of left lower leg limited to breakdown of skin: Secondary | ICD-10-CM | POA: Diagnosis not present

## 2023-03-11 DIAGNOSIS — I890 Lymphedema, not elsewhere classified: Secondary | ICD-10-CM | POA: Diagnosis not present

## 2023-03-11 DIAGNOSIS — L97521 Non-pressure chronic ulcer of other part of left foot limited to breakdown of skin: Secondary | ICD-10-CM | POA: Diagnosis not present

## 2023-03-11 NOTE — Progress Notes (Signed)
[name], [name] ([id number]) [id number] Page 1 of 17 Visit Report for 03/11/2023 Chief Complaint Document Details Patient Name: Date of Service: [name], [name] 03/11/2023 11:15 A M Medical Record Number: [id number] Patient Account Number: [id number] Date of Birth/Sex: Treating RN: 06-29-1951 (72 y.o. M) Primary Care Provider: [name] Other Clinician: Referring Provider: Treating Provider/Extender: [name] in Treatment: 72 Information Obtained from: Patient Chief Complaint Patient seen for complaints of Non-Healing Wounds. Electronic Signature(s) Signed: 03/11/2023 11:31:24 AM By: Duanne Guess MD FACS Entered By: Duanne Guess on 03/11/2023 11:31:24 -------------------------------------------------------------------------------- Debridement Details Patient Name: Date of Service: [name] 03/11/2023 11:15 A M Medical Record Number: [id number] Patient Account Number: [id number] Date of Birth/Sex: Treating RN: 1951/05/16 (72 y.o. [name] Primary Care Provider: [name] Other Clinician: Referring Provider: Treating Provider/Extender: [name] in Treatment: 72 Debridement Performed for Assessment: Wound #3 Right,Medial Lower Leg Performed By: Physician Duanne Guess, MD Debridement Type: Debridement Level of Consciousness (Pre-procedure): Awake and Alert Pre-procedure Verification/Time Out Yes - 11:18 Taken: Start Time: 11:18 Pain Control: Lidocaine 4% T opical Solution Percent of Wound Bed Debrided: 70% T Area Debrided (cm): otal 49.46 Tissue and other material debrided: Non-Viable, Slough, Slough Level: Non-Viable Tissue Debridement Description: Selective/Open Wound Instrument: Curette Bleeding: Minimum Hemostasis Achieved: Pressure Response to Treatment: Procedure was tolerated well Level of Consciousness (Post- Awake and Alert procedure): Post  Debridement Measurements of Total Wound Length: (cm) 10 Width: (cm) 9 Depth: (cm) 0.4 Volume: (cm) 28.274 Character of Wound/Ulcer Post Debridement: Improved Post Procedure Diagnosis Same as [name], [name] ([id number]) [id number] Page 2 of 17 Notes scribed for Dr. [name] by [name], RN Electronic Signature(s) Signed: 03/11/2023 11:50:25 AM By: [name] Signed: 03/11/2023 11:55:54 AM By: Duanne Guess MD FACS Entered By: [name] on 03/11/2023 11:18:42 -------------------------------------------------------------------------------- Debridement Details Patient Name: Date of Service: [name], [name] 03/11/2023 11:15 A M Medical Record Number: [id number] Patient Account Number: [id number] Date of Birth/Sex: Treating RN: [birth year] (72 y.o. [name] Primary Care Provider: [name] Other Clinician: Referring Provider: Treating Provider/Extender: [name] in Treatment: 72 Debridement Performed for Assessment: Wound #6 Right,Anterior Lower Leg Performed By: Physician Duanne Guess, MD Debridement Type: Debridement Level of Consciousness (Pre-procedure): Awake and Alert Pre-procedure Verification/Time Out Yes - 11:18 Taken: Start Time: 11:18 Pain Control: Lidocaine 4% T opical Solution Percent of Wound Bed Debrided: 100% T Area Debrided (cm): otal 1.18 Tissue and other material debrided: Non-Viable, Slough, Slough Level: Non-Viable Tissue Debridement Description: Selective/Open Wound Instrument: Curette Bleeding: Minimum Hemostasis Achieved: Pressure Response to Treatment: Procedure was tolerated well Level of Consciousness (Post- Awake and Alert procedure): Post Debridement Measurements of Total Wound Length: (cm) 1 Width: (cm) 1.5 Depth: (cm) 0.2 Volume: (cm) 0.236 Character of Wound/Ulcer Post Debridement: Improved Post Procedure Diagnosis Same as  Pre-procedure Notes scribed for Dr. [name] by [name], RN Electronic Signature(s) Signed: 03/11/2023 11:50:25 AM By: [name] Signed: 03/11/2023 11:55:54 AM By: Duanne Guess MD FACS Entered By: [name] on 03/11/2023 11:19:11 -------------------------------------------------------------------------------- HPI Details Patient Name: Date of Service: [name] 03/11/2023 11:15 A M Medical Record Number: [id number] Patient Account Number: [id number] [name], [name] ([id number]) [id number] Page 3 of 17 Date of Birth/Sex: Treating RN: 02/16/1951 (72 y.o. M) Primary Care Provider: Other Clinician: [name] Referring Provider: Treating Provider/Extender: [name] in Treatment: 70 History of Present Illness HPI Description: ADMISSION 02/09/2022 This is a 72 year old man who recently moved to [place] from  [place]. He has a history of of coronary artery disease and prior left popliteal vein DVT . He is not diabetic and does not smoke. 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 He has had multiple spinal surgeries and has limited mobility. He primarily uses a motorized wheelchair. He has had lymphedema for many years and does have lymphedema pumps although he says he does not use them frequently. He has wounds on his bilateral lower extremities. He was receiving care in [place] for these prior to his move. They were apparently packing his wounds with Betadine soaked gauze. He has worn compression wraps in the past but not recently. He did say that they helped tremendously. In clinic today, his right ABI is noncompressible but has good signals; the left ABI was 1.17. No formal vascular studies have been performed. On his left dorsal foot, there is a circular lesion that exposes the fat layer. There is fairly heavy slough accumulation within the wound, but no significant odor. On his  right medial calf, he has multiple pinholes that have slough buildup in them and probe for about 2 to 4 mm in depth. They are draining clear fluid. On his right lateral midfoot, he has ulceration consistent with venous stasis. It is geographic and has slough accumulation. 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 02/17/2022: All of the wounds are little bit larger today and have more slough accumulation. Today, the medial calf openings are larger and probe to something that feels calcified. There is odor coming from his wounds. His vascular studies are scheduled for August 9 and 10. 02-24-2022 upon evaluation today patient presents for follow-up concerning his ongoing issues here with his lower extremities. With that being said he does have significant problems with what appears to be cellulitis unfortunately the PCR culture that was sent last week got crushed by [employer] in transit. With that being said we are going to reobtain a culture today although I am actually to do it on the right medial leg as this seems to be the most inflamed area today where there is some questionable drainage as well. I am going to remove some of the calcium deposits which are loosening obtain a deeper culture from this area. Fortunately I do not see any evidence of active infection systemically which is good news but locally I think we are having a bigger issue here. He does have his appointment next Thursday with vascular. Patient has also been referred to [facility] for further evaluation and treatment of the calcinosis for possible sulfate injections. 03/04/2022: The culture that was performed last week grew out multiple species including Klebsiella, Morganella, and Pseudomonas aeruginosa. He had been prescribed Bactrim but this was not adequate to cover all of the species and levofloxacin was recommended. He completed the Bactrim but  did not pick up the levofloxacin and begin taking it until yesterday. 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 He had arterial studies performed today. He does have evidence of peripheral arterial disease. Results copied here: +-------+-----------+-----------+------------+------------+ ABI/TBIT oday's ABIT oday's TBIPrevious ABIPrevious TBI +-------+-----------+-----------+------------+------------+ Right [place] 0.75    +-------+-----------+-----------+------------+------------+ Left  0.57    +-------+-----------+-----------+------------+------------+ Arterial wall calcification precludes accurate ankle pressures and ABIs. Summary: Right: Resting right ankle-brachial index indicates noncompressible right lower extremity  arteries. The right toe-brachial index is normal. Left: Resting left ankle-brachial index indicates noncompressible left lower extremity arteries. The left toe-brachial index is abnormal. He continues to have further tissue breakdown at the right medial calf and there is ample slough accumulation along with necrotic subcutaneous tissue. The left dorsal foot wound has built up slough, as well. The right medial foot wound is nearly closed with just a light layer of eschar over the surface. The right dorsal lateral foot wound has also accumulated a fair amount of slough and remains quite tender. 03/10/2022: He has been taking the prescribed levofloxacin and has about 3 days left of this. The erythema and induration on his right leg has improved. He continues to experience further breakdown of the right medial calf wound. There is extensive slough and debris accumulation there. There is heavy slough on his left dorsal foot wound, but no odor or purulent drainage. The right medial foot wound appears to  be closed. The right dorsal lateral foot wound also has a fair amount of slough but it is less tender than at our last visit. He still does not have an appointment with dermatology. 03/22/2022: The right anterior tibial wound has closed. The right lateral foot wound is nearly closed with just a little bit of slough. The left dorsal foot wound is smaller and continues to have some slough buildup but less so than at prior visits. There is good granulation tissue underlying the slough. Unfortunately the right medial calf wound continues to deteriorate. It has a foul odor today and a lot of necrotic tissue, the surrounding skin is also erythematous and moist. 03/30/2022: The right lateral foot wound continues to contract and just has a little bit of slough and eschar present. The left dorsal foot wound is also smaller with slough overlying good granulation tissue. The right medial calf wound actually looks quite a bit better today; there is no odor and there is much less necrotic tissue although some remains present. We have been using topical gentamicin and he has been taking oral Augmentin. 04/07/2022: The patient saw dermatology at [facility] last week. Unfortunately, it appears that they did not read any of the documentation that was provided. 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 Overall, it was a highly unsatisfactory consultation. In the meantime, however, we were able to identify compounding pharmacy who could create a topical sodium thiosulfate ointment. The patient has that with him today. Both of the right foot wounds are now closed. The left dorsal foot wound has contracted but still has a layer of slough on the surface. The  medial calf wounds on the right all look a little bit better. They still have heavy slough along with the chunks of calcium. Everything is stained purple. 04/14/2022: The wound on his dorsal left foot is a little bit smaller again today. There is still slough accumulation on the surface. The medial calf wounds have quite a bit less slough accumulation today. His right leg, however, is warm and erythematous, concerning for cellulitis. 04/14/2022: He completed his course of Augmentin yesterday. The medial calf wound sites are much cleaner today and shallower. There is less fragmented calcium in the wounds. He has a lot of lymphedema fluid-like drainage from these wounds, but  no purulent discharge or malodor. The wound on his dorsal left [name], [name] ([id number]) [id number] Page 4 of 17 foot has also contracted and is shallower. There is a layer of slough over healthy granulation tissue. 05/05/2022: The left dorsal foot wound is smaller today with less slough and more granulation tissue. The right medial calf wounds are shallower, but have extensive slough and some fat necrosis. The drainage has diminished considerably, however. 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 05/12/2022: The left dorsal foot wound continues to contract and fill with granulation tissue. There is slough on the wound surface. The right medial calf wounds also continue to fill with healthy tissue, but there is remaining slough and fat necrosis present. He had a significant increase in his drainage this week and it was a bright yellow-green color. He also had a new wound open over his right anterior tibial surface associated with the spike of cutaneous calcium. The leg is more red, as well, concerning for infection. 05/19/2022: His culture returned positive for Pseudomonas. He is currently  taking a course of oral levofloxacin. We have also been using topical gentamicin mixed in with his sodium thiosulfate. All of the wounds look better this week. 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 There is slough and nonviable subcutaneous tissue still present. The new anterior tibial wound just has a light layer of slough. The dorsal foot wound also has some slough present and appears a little dry today. 05/26/2022: The right medial leg wound continues to improve. It is feeling with granulation tissue, but still accumulates a fairly thick layer of slough on the surface. The more recent anterior tibial wound has remained quite small, but also has some slough on the surface. The left dorsal foot wound has contracted somewhat and has perimeter epithelialization. He completed his oral levofloxacin. 06/02/2022: The left dorsal foot wound continues to improve significantly. There is just a little slough on the wound surface. The right medial leg wound had black drainage, but it looks like silver alginate was applied last week and I theorized that silver sulfide was created with a combination of the silver alginate and the sodium thiosulfate. It did, however, have a bit of an odor to it and extensive slough accumulation. The small anterior tibial wound also had a bit of slough and nonviable subcutaneous tissue present. The skin of his leg was rather macerated, as well. 06/09/2022: The left dorsal foot wound is smaller again this week with just a light layer of slough on the surface. The right medial leg wound looks substantially better. The leg and periwound skin are no longer red and macerated. There is good granulation tissue forming with less slough and nonviable tissue. The anterior tibial wound just has a small amount of slough accumulation. 06/16/2022: The left dorsal foot wound continues to contract. His wrap slipped a little bit, however, and his foot is more  swollen. The right medial leg wound continues to improve. The underlying tissue is more vital-appearing and there is less slough. He does have substantial drainage. The small anterior tibial wound has a bit of slough accumulation. 06/23/2022: The left dorsal foot wound is about half the size as it was last week. There is just some light slough on the surface and some eschar buildup around the edges. The right anterior leg wound is small with a little slough on  the surface. Unfortunately, the right medial leg wound deteriorated fairly substantially. It is malodorous with gray/green/black discoloration. 06/30/2022: The left dorsal foot wound continues to contract. It is nearly closed with just a light layer of slough present. The right anterior leg wound is about the same size and has a small amount of slough on the surface. The right medial leg wound looks a lot better than it did last week. The odor has abated and the tissue is less pulpy and necrotic-looking. There is still a fairly thick layer of slough present. 12/13; the left dorsal foot wound looks very healthy. There was no need to change the dressing here and no debridement. He has a small area on the right anterior lower leg apparently had not changed much in size. The area on the medial leg is the most problematic area this is large with some depth and a completely nonviable surface today. 07/14/2022: The left dorsal foot wound is smaller today with just a little slough and eschar present. The right anterior lower leg wound is about the same without any significant change. The medial leg wound has a black and green surface and is malodorous. No purulent drainage and the periwound skin is intact. Edema control is suboptimal. 07/21/2022: The dorsal foot wound continues to contract and is nearly closed with just a little bit of slough and eschar on the surface. The right anterior lower leg wound is shallower today but otherwise the dimensions  are unchanged. The medial leg wound looks significantly better although it still has a nonviable surface; the odor and black/green surface has resolved. His culture came back with multiple species sensitive to Augmentin, which was prescribed and a very low level of Pseudomonas, which should be handled by the topical gentamicin we have been using. 07/28/2022: The dorsal foot wound is down to just a very tiny opening with a little eschar on the surface. The right anterior tibial wound is about the same with some slough buildup. The right medial leg wound looks quite a bit better this week. There is thick rubbery slough on the surface, but the odor and drainage has improved significantly. 08/04/2022: The dorsal foot wound is almost healed, but the periwound skin has broken down. This appears to be secondary to moisture and adhesive from the absorbent pad. The right anterior tibial wound is shallower. The right medial leg wound continues to improve. It is smaller, still with rubbery slough on the surface. No malodor or purulent drainage. 08/11/2022: The dorsal foot wound is about the same size. It is a little bit dry with some slough accumulation. The right anterior tibial wound is also unchanged. The right medial leg wound is filling in further with good granulation tissue. Still with some slough buildup on the surface. 08/18/2022: His left leg is bright red, hot, and swollen with cellulitis. The dorsal foot wound actually looks quite good and is superficial with just a little bit of slough accumulation. The right anterior tibial wound is unchanged. The right medial leg wound has filled in with good granulation tissue but has copious amounts of drainage. 08/25/2022: His cellulitis has responded nicely to the oral antibiotics. His dorsal foot wound is smaller but has a fair amount of slough buildup. The right anterior tibial wound remains stable and unchanged. The right medial leg wound has more granulation  tissue under a layer of slough, but continues to have copious drainage. The drainage is actually causing skin irritation and threatens to result in breakdown. 09/01/2022: He continues  to have profuse drainage from his wounds which is resulting in tissue maceration on both the dorsum of his left foot and the periwound distal to his medial leg wound. The dorsal foot wound is nearly healed, despite this. The medial right leg wound is filling in with good granulation tissue. We are working on getting home health assistance to change his dressings more frequently to try and control the drainage better. 09/08/2022: We have had success in controlling his drainage. The tissue maceration has improved significantly and his swelling has come down quite markedly. The medial right leg wound is much more superficial and has substantially less nonviable tissue present. The anterior tibial wound is a little bit larger, however with some nonviable tissue present. 09/15/2022: The dorsal foot wound is nearly closed. The anterior tibial wound measures slightly larger today. The medial right leg wound continues to improve quite dramatically with less nonviable tissue and more robust-looking granulation. Edema control is significantly better. 09/22/2022: The left dorsal foot wound remains open, albeit quite tiny. The anterior tibial wound is a little bit larger again today. The medial right leg wound continues to improve with an increase in robust and healthy granulation tissue and less accumulation of nonviable tissue. 09/29/2022: His home health providers wrapped his left leg extremely tightly and he ended up having to cut the wrap off of his foot. This resulted in his foot being very swollen and that the dorsal foot wound is a little bit larger today. The anterior tibial wound is about the same size with a little slough on the surface. The medial right leg wound continues to fill in. There are very few areas with any  significant depth. There is still slough accumulation. 3/13; Patient has a left dorsal foot wound along with a right anterior tibial wound and right medial wound. We are using silver alginate to the left dorsal foot QUINNTEN, BITER ([id number]) [id number] Page 5 of 17 wound, Santyl and sodium thiosulfate compound ointment to the right lower extremity wounds under 4 layer compression. Patient has no issues or complaints today. 10/13/2022: The left dorsal foot wound is a little bit bigger today, but his foot is also a little bit more swollen. The right medial leg wound has filled in more and has substantially less depth. There is still some nonviable tissue present. The small anterior lower leg wound looks about the same. 10/20/2022: The left dorsal foot wound is epithelializing. There does not appear to be much open at this site. There is some eschar around the edges. The right medial leg wound continues to fill in with granulation tissue. The small anterior lower leg wound is a little bit bigger and there is a chunk of calcium protruding. He continues to have fairly copious drainage from the right leg. 10/27/2022: His home health agency failed to show up at all this past week and as a result, the excessive drainage from his right leg wound has resulted in tissue breakdown. His leg is red and irritated. The wounds themselves look about the same. The wound on his left dorsal foot is nearly closed underneath some eschar. 11/03/2022: We continue to have difficulty with his home health agency. He continues to have significant drainage from his right leg wound which has resulted in even further tissue breakdown. He also was not taking his Lasix as prescribed and I think much of the drainage has been his lymphedema fluid choosing the path of least resistance. The wound on his left dorsal foot is closed, but  in the process of cleaning the site, dry skin was pulled off and he has a new  small superficial wound just adjacent to where the dorsal foot wound was. 11/12/2022: His left dorsal foot is completely healed. The right medial leg continues to deteriorate. He is not taking his Lasix as prescribed, nor is he using his lymphedema pumps at all. As a result, he has massive amounts of drainage saturating his dressings and causing further tissue breakdown on his medial leg. Today, his dressings had a foul odor, as well. 11/19/2022: His right medial leg looks even worse today. There is more erythema and moisture related tissue breakdown. The 2 anterior wounds look a little bit better. The culture that I took last week did produce Pseudomonas and he is currently taking levofloxacin for this. 11/26/2022: There has been significant improvement to his right medial leg. It is far less raw and many of the superficial open areas have epithelialized. He continues to cut the foot portion of his wrap and as result his foot is bulbous and swollen compared to the rest of his leg, where edema control is good. The smaller of the 2 anterior tibial wound is closed, while the larger is contracting somewhat with a bit of slough at the base. 12/03/2022: His leg continues to demonstrate improvement. There has been epithelialization of much of the denuded area. He has a new small anterior tibial wound that has opened around a nidus of calcium. Edema control is improved. 12/10/2022: Continued improvement in the periwound skin. The anterior tibial wounds are about the same, but the medial leg wound has improved further. There is less nonviable tissue present. 12/17/2022: Between his visit on Tuesday with the nurse and today, he has had significantly more drainage which has resulted in some periwound tissue maceration. I suspect this correlates somewhat with his diuretic use, as he says he takes his diuretic religiously on Saturday, Sunday, and Monday and then does not take it after Tuesday due to being out and about  and not being close to easy restroom access. I suspect the excess fluid is just simply draining out of his leg. 12/21/2022: This was supposed to be a nurse visit, but his leg has broken down so significantly, that we have converted to a wound care visit. He did take his diuretics over the weekend, but despite this, he has had copious drainage and all of his tissues have broken down substantially from the right medial leg ulcer all the way down to his ankle and even wrapping posterior to this. There is blue-green staining on his dressing, suggesting ongoing presence of Pseudomonas aeruginosa. 12/24/2022: The wound already looks better than it did 2 days ago. He has been taking ciprofloxacin and the Urgo clean dressing we applied did a much better job of wicking the drainage away from his leg. There is slough on the entire surface of the medial leg wound, but the erythema and induration has improved in the past couple of days. 12/31/2022: Continued improvement of the wound. He says that the pain has basically abated since being on ciprofloxacin. Still with fairly heavy drainage and slough accumulation. 01/07/2023: Overall continued improvement. Still with heavy slough buildup, but less drainage. 01/14/2023: There is a little bit of improvement. The skin is still looking a bit excoriated. He has a few more days left of ciprofloxacin. 01/21/2023: There has been significant improvement. The periwound skin is in much better condition. The ulcers are shallower and actually have some good granulation tissue present  under some slough. 01/28/2023: The wound continues to improve. All of the open areas measured smaller today. There is slough accumulation on the surfaces. His drainage has decreased markedly. 02/04/2023: Continued improvement in the wound. Drainage remains significantly diminished. Slough accumulation is present but to a lesser extent. Periwound skin continues to improve. Edema control is  good. 02/18/2023: The heavy drainage persists, although it is was not as significant today as it was at his nurse visit on Tuesday. The drainage is yellow and does not have the blue-green discoloration suggestive of Pseudomonas. The actual wounds on his leg are getting smaller, but the periwound skin is quite excoriated. 02/25/2023: His periwound skin is quite excoriated and he has a lot of clear yellow drainage. The main wounds are filling in further and epithelializing. 03/04/2023: The excoriation of the periwound skin has improved. Most of that area has dried up. The main wounds have less slough in them today. 03/11/2023: The main wounds have accumulated a little bit of slough. The periwound excoriation continues to improve. Unfortunately, he suffered a fall over the previous weekend and has abrasions on his knees bilaterally and on his right wrist and upper arm. In addition, the skin on his left lower leg apparently blistered and opened, leaving several superficial wounds on this extremity. He has been approved for the [facility] and Press wound VAC and has the equipment with him today. Electronic Signature(s) Signed: 03/11/2023 11:33:06 AM By: Duanne Guess MD FACS Entered By: Duanne Guess on 03/11/2023 11:33:06 [name] ([id number]) [id number] Page 6 of 17 -------------------------------------------------------------------------------- Physical Exam Details Patient Name: Date of Service: YASSIN, KRAVETS 03/11/2023 11:15 A M Medical Record Number: [id number] Patient Account Number: [id number] Date of Birth/Sex: Treating RN: May 31, 1951 (72 y.o. M) Primary Care Provider: [name] Other Clinician: Referring Provider: Treating Provider/Extender: [name] in Treatment: 52 Constitutional Slightly hypertensive. . . . no acute distress. Respiratory Normal work of breathing on room air. Notes 03/11/2023: The main wounds have  accumulated a little bit of slough. The periwound excoriation continues to improve. Unfortunately, he suffered a fall over the previous weekend and has abrasions on his knees bilaterally and on his right wrist and upper arm. In addition, the skin on his left lower leg apparently blistered and opened, leaving several superficial wounds on this extremity. Electronic Signature(s) Signed: 03/11/2023 11:34:24 AM By: Duanne Guess MD FACS Entered By: Duanne Guess on 03/11/2023 11:34:24 -------------------------------------------------------------------------------- Physician Orders Details Patient Name: Date of Service: [name] 03/11/2023 11:15 A M Medical Record Number: [id number] Patient Account Number: [id number] Date of Birth/Sex: Treating RN: 1951/03/05 (72 y.o. 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 - Dr. [name] RM 1 Return A Anesthetic (In clinic) Topical Lidocaine 4% applied to wound bed Bathing/ Shower/ Hygiene May shower with protection but do not get wound dressing(s) wet. Protect dressing(s)  with water repellant cover (for example, large  plastic bag) or a cast cover and may then take shower. JARRELL, SFERRAZZA ([id number]) [id number] Page 7 of 17 Negative Presssure Wound Therapy Wound Vac to wound continuously at 168mm/hg pressure Black Foam Edema Control - Lymphedema / SCD / Other Lymphedema Pumps. Use Lymphedema pumps on leg(s) 2-3 times a day for 45-60 minutes. If wearing any wraps or hose, do not remove them. Continue exercising as instructed. Elevate legs to the level of the heart or above for 30 minutes daily and/or when sitting for 3-4 times a day throughout the day. - throughout the day Avoid standing for long periods of time. Compression stocking or Garment 20-30 mm/Hg pressure to: - juxtalite left leg daily Other Edema Control Orders/Instructions: - kerlix/coban wrap to left leg while wife recovers from surgery Wound Treatment Wound #3 - Lower Leg Wound Laterality: Right, Medial Peri-Wound Care: Sween Lotion (Moisturizing lotion) 1 x Per Week/30 Days Discharge Instructions: Apply moisturizing lotion as directed Prim Dressing: peel and place vac ary 1 x Per Week/30 Days Compression Wrap: ThreePress (3 layer compression wrap) 1 x Per Week/30 Days Discharge Instructions: Apply three layer compression as directed. Wound #6 - Lower Leg Wound Laterality: Right, Anterior Peri-Wound Care: Sween Lotion (Moisturizing lotion) 1 x Per Week/30 Days Discharge Instructions: Apply moisturizing lotion as directed Prim Dressing: peel and place vac ary 1 x Per Week/30 Days Compression Wrap: ThreePress (3 layer compression wrap) 1 x Per Week/30 Days Discharge Instructions: Apply three layer compression as directed. Wound #7 - Lower Leg Wound Laterality: Left, Anterior Cleanser: Soap and Water 1 x Per Week/30 Days Discharge Instructions: May shower and wash wound with dial antibacterial soap and water prior to dressing change. Peri-Wound Care: Sween Lotion (Moisturizing lotion) 1 x Per Week/30  Days Discharge Instructions: Apply moisturizing lotion as directed Prim Dressing: Maxorb Extra Ag+ Alginate Dressing, 2x2 (in/in) 1 x Per Week/30 Days ary Discharge Instructions: Apply to wound bed as instructed Secondary Dressing: ABD Pad, 8x10 1 x Per Week/30 Days Discharge Instructions: Apply over primary dressing as directed. Compression Wrap: ThreePress (3 layer compression wrap) 1 x Per Week/30 Days Discharge Instructions: Apply three layer compression as directed. Patient Medications llergies: No Known Allergies A Notifications Medication Indication Start End 03/11/2023 lidocaine DOSE 0 - topical 4 % cream - 0 cream topical Electronic Signature(s) Signed: 03/11/2023 11:50:25 AM By: [name] Signed: 03/11/2023 11:55:54 AM By: Duanne Guess MD FACS Entered By: [name] on 03/11/2023 11:39:13 -------------------------------------------------------------------------------- Problem List Details Patient Name: Date of Service: [name] 03/11/2023 11:15 A M Medical Record Number: [id number] Patient Account Number: [id number] Date of Birth/Sex: Treating RN: 1950-09-06 (72 y.o. [name] ([id number]) [id number] Page 8 of 17 Primary Care Provider: [name] Other Clinician: Referring Provider: Treating Provider/Extender: [name] in Treatment: 56 Active Problems ICD-10 Encounter Code Description Active Date MDM Diagnosis L97.812 Non-pressure chronic ulcer of other part of right lower leg with fat layer 02/09/2022 No Yes exposed L97.821 Non-pressure chronic ulcer of other part of left lower leg limited to breakdown 03/11/2023 No Yes of skin L98.492 Non-pressure chronic ulcer of skin of other sites with fat layer exposed 03/11/2023 No Yes 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 02/09/2022 No Yes 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) 02/09/2022 No Yes I89.0 Lymphedema, not elsewhere classified  02/09/2022 No Yes I10 Essential (primary) hypertension 02/09/2022 No Yes I25.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 angina pectoris 02/09/2022 No Yes Z86.718 Personal history of other venous thrombosis  and embolism 02/09/2022 No Yes Inactive Problems ICD-10 Code Description Active Date Inactive Date L97.512 Non-pressure chronic ulcer of other part of right foot with fat layer exposed 02/17/2022 02/17/2022 Resolved Problems ICD-10 Code Description Active Date Resolved Date L97.522 Non-pressure chronic ulcer of other part of left foot with fat layer exposed 02/09/2022 02/09/2022 Electronic Signature(s) Signed: 03/11/2023 11:30:34 AM By: Duanne Guess MD FACS Entered By: Duanne Guess on 03/11/2023 11:30:33 [name] ([id number]) [id number] Page 9 of 17 -------------------------------------------------------------------------------- Progress Note Details Patient Name: Date of Service: [name], [name] 03/11/2023 11:15 A M Medical Record Number: [id number] Patient Account Number: [id number] Date of Birth/Sex: Treating RN: 10-20-1950 (72 y.o. M) Primary Care Provider: [name] Other Clinician: Referring Provider: Treating Provider/Extender: [name] in Treatment: 11 Subjective Chief Complaint Information obtained from Patient Patient seen for complaints of Non-Healing Wounds. History of Present Illness (HPI) ADMISSION 02/09/2022 This is a 72 year old man who recently moved to [place] from [place]. He has a history of of coronary artery disease and prior left popliteal vein DVT . He is not diabetic and does not smoke. 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 He has had multiple spinal surgeries and has limited mobility. He primarily uses a motorized wheelchair. He has had lymphedema for many years and does have lymphedema pumps although he says he does not use them frequently.  He has wounds on his bilateral lower extremities. He was receiving care in [place] for these prior to his move. They were apparently packing his wounds with Betadine soaked gauze. He has worn compression wraps in the past but not recently. He did say that they helped tremendously. In clinic today, his right ABI is noncompressible but has good signals; the left ABI was 1.17. No formal vascular studies have been performed. On his left dorsal foot, there is a circular lesion that exposes the fat layer. There is fairly heavy slough accumulation within the wound, but no significant odor. On his right medial calf, he has multiple pinholes that have slough buildup in them and probe for about 2 to 4 mm in depth. They are draining clear fluid. On his right lateral midfoot, he has ulceration consistent with venous stasis. It is geographic and has slough accumulation. 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 02/17/2022: All of the wounds are little bit larger today and have more slough accumulation. Today, the medial calf openings are larger and probe to something that feels calcified. There is odor coming from his wounds. His vascular studies are scheduled for August 9 and 10. 02-24-2022 upon evaluation today patient presents for follow-up concerning his ongoing issues here with his lower extremities. With that being said he does have significant problems with what appears to be cellulitis unfortunately the PCR culture that was sent last week got crushed by [employer] in transit. With that being said we are going to reobtain a culture today although I am actually to do it on the right medial leg as this seems to be the most inflamed area today where there is some questionable drainage as well. I am going to remove some of the calcium deposits which are loosening obtain a deeper culture from this area. Fortunately I do not see any  evidence of active infection systemically which is good news but locally I think we are having a bigger issue here. He does have his appointment next Thursday with vascular.  Patient has also been referred to [facility] for further evaluation and treatment of the calcinosis for possible sulfate injections. 03/04/2022: The culture that was performed last week grew out multiple species including Klebsiella, Morganella, and Pseudomonas aeruginosa. He had been prescribed Bactrim but this was not adequate to cover all of the species and levofloxacin was recommended. He completed the Bactrim but did not pick up the levofloxacin and begin taking it until yesterday. 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 He had arterial studies performed today. He does have evidence of peripheral arterial disease. Results copied here: +-------+-----------+-----------+------------+------------+ ABI/TBIT oday's ABIT oday's TBIPrevious ABIPrevious TBI +-------+-----------+-----------+------------+------------+ Right [place] 0.75    +-------+-----------+-----------+------------+------------+ Left [place] 0.57    +-------+-----------+-----------+------------+------------+ Arterial wall calcification precludes accurate ankle pressures and ABIs. Summary: Right: Resting right ankle-brachial index indicates noncompressible right lower extremity arteries. The right toe-brachial index is normal. Left: Resting left ankle-brachial index indicates noncompressible left lower extremity arteries. The left toe-brachial index is abnormal. He continues to have further tissue breakdown at the right medial calf and there is ample slough accumulation along with necrotic subcutaneous tissue. The left dorsal foot wound has built up slough, as well. The right medial  foot wound is nearly closed with just a light layer of eschar over the surface. The right dorsal lateral foot wound has also accumulated a fair amount of slough and remains quite tender. 03/10/2022: He has been taking the prescribed levofloxacin and has about 3 days left of this. The erythema and induration on his right leg has improved. He continues to experience further breakdown of the right medial calf wound. There is extensive slough and debris accumulation there. There is heavy slough on his left dorsal foot wound, but no odor or purulent drainage. The right medial foot wound appears to be closed. The right dorsal lateral foot wound also has a fair amount of slough but it is less tender than at our last visit. He still does not have an appointment with dermatology. 03/22/2022: The right anterior tibial wound has closed. The right lateral foot wound is nearly closed with just a little bit of slough. The left dorsal foot wound is smaller and continues to have some slough buildup but less so than at prior visits. There is good granulation tissue underlying the slough. Unfortunately the right medial calf wound continues to deteriorate. It has a foul odor today and a lot of necrotic tissue, the surrounding skin is also erythematous and moist. [name], [name] ([id number]) [id number] Page 10 of 17 03/30/2022: The right lateral foot wound continues to contract and just has a little bit of slough and eschar present. The left dorsal foot wound is also smaller with slough overlying good granulation tissue. The right medial calf wound actually looks quite a bit better today; there is no odor and there is much less necrotic tissue although some remains present. We have been using topical gentamicin and he has been taking oral Augmentin. 04/07/2022: The patient saw dermatology at [facility] last week. Unfortunately, it appears that they did not read any of  the documentation that was provided. 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 Overall, it was a highly unsatisfactory consultation. In the meantime, however, we were able to identify compounding pharmacy who could create a topical sodium thiosulfate ointment. The patient has that with him today. Both of the right foot wounds are now closed. The left dorsal foot wound has contracted but still has a layer of slough on the surface. The medial calf wounds on the right all look a little bit better. They still have heavy slough along with the chunks of calcium. Everything is stained purple. 04/14/2022: The wound on his dorsal left foot is a little bit smaller again today. There is still slough accumulation on the surface. The medial calf wounds have quite a bit less slough accumulation today. His right leg, however, is warm and erythematous, concerning for cellulitis. 04/14/2022: He completed his course of Augmentin yesterday. The medial calf wound sites are much cleaner today and shallower. There is less fragmented calcium in the wounds. He has a lot of lymphedema fluid-like drainage from these wounds, but no purulent discharge or malodor. The wound on his dorsal left foot has also contracted and is shallower. There is a layer of slough over healthy granulation tissue. 05/05/2022: The left dorsal foot wound is smaller today with less slough and more granulation tissue. The right medial calf wounds are shallower, but have extensive slough and some fat necrosis. The drainage has diminished considerably, however. 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 05/12/2022: The left dorsal foot wound continues to contract and fill with granulation tissue. There is slough on the wound surface. The right medial calf wounds also continue to fill with healthy tissue, but there is remaining slough and fat necrosis present. He had a significant increase in his drainage this week and it was a bright yellow-green color. He also had a new wound open over his right anterior tibial surface associated with the spike of cutaneous calcium. The leg is more red, as well, concerning for infection. 05/19/2022: His culture returned positive for Pseudomonas. He is currently taking a course of oral levofloxacin. We have also been using topical gentamicin mixed in with his sodium thiosulfate. All of the wounds look better this week. 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 There is slough and nonviable subcutaneous tissue still present. The new anterior tibial wound just has a light layer of slough. The dorsal foot wound also has some slough present and appears a little dry today. 05/26/2022: The right medial leg wound continues to improve. It is feeling with granulation tissue, but still accumulates a fairly thick layer of slough on the surface. The more recent anterior tibial wound has remained quite small, but also has some slough on the surface. The left dorsal foot wound has contracted somewhat and has perimeter epithelialization. He completed his oral levofloxacin. 06/02/2022: The left dorsal foot wound continues to improve significantly. There is just a little slough on the wound surface. The right medial leg wound had black drainage, but it looks like silver alginate was applied last week and I theorized that silver sulfide was created with a combination of the silver alginate and the sodium thiosulfate. It did, however, have a bit of an odor to it and extensive slough accumulation. The small anterior tibial wound also had a bit of  slough and nonviable subcutaneous tissue present. The skin of his leg was rather macerated, as  well. 06/09/2022: The left dorsal foot wound is smaller again this week with just a light layer of slough on the surface. The right medial leg wound looks substantially better. The leg and periwound skin are no longer red and macerated. There is good granulation tissue forming with less slough and nonviable tissue. The anterior tibial wound just has a small amount of slough accumulation. 06/16/2022: The left dorsal foot wound continues to contract. His wrap slipped a little bit, however, and his foot is more swollen. The right medial leg wound continues to improve. The underlying tissue is more vital-appearing and there is less slough. He does have substantial drainage. The small anterior tibial wound has a bit of slough accumulation. 06/23/2022: The left dorsal foot wound is about half the size as it was last week. There is just some light slough on the surface and some eschar buildup around the edges. The right anterior leg wound is small with a little slough on the surface. Unfortunately, the right medial leg wound deteriorated fairly substantially. It is malodorous with gray/green/black discoloration. 06/30/2022: The left dorsal foot wound continues to contract. It is nearly closed with just a light layer of slough present. The right anterior leg wound is about the same size and has a small amount of slough on the surface. The right medial leg wound looks a lot better than it did last week. The odor has abated and the tissue is less pulpy and necrotic-looking. There is still a fairly thick layer of slough present. 12/13; the left dorsal foot wound looks very healthy. There was no need to change the dressing here and no debridement. He has a small area on the right anterior lower leg apparently had not changed much in size. The area on the medial leg is the most problematic area this is large with some  depth and a completely nonviable surface today. 07/14/2022: The left dorsal foot wound is smaller today with just a little slough and eschar present. The right anterior lower leg wound is about the same without any significant change. The medial leg wound has a black and green surface and is malodorous. No purulent drainage and the periwound skin is intact. Edema control is suboptimal. 07/21/2022: The dorsal foot wound continues to contract and is nearly closed with just a little bit of slough and eschar on the surface. The right anterior lower leg wound is shallower today but otherwise the dimensions are unchanged. The medial leg wound looks significantly better although it still has a nonviable surface; the odor and black/green surface has resolved. His culture came back with multiple species sensitive to Augmentin, which was prescribed and a very low level of Pseudomonas, which should be handled by the topical gentamicin we have been using. 07/28/2022: The dorsal foot wound is down to just a very tiny opening with a little eschar on the surface. The right anterior tibial wound is about the same with some slough buildup. The right medial leg wound looks quite a bit better this week. There is thick rubbery slough on the surface, but the odor and drainage has improved significantly. 08/04/2022: The dorsal foot wound is almost healed, but the periwound skin has broken down. This appears to be secondary to moisture and adhesive from the absorbent pad. The right anterior tibial wound is shallower. The right medial leg wound continues to improve. It is smaller, still with rubbery slough on the surface. No malodor or purulent drainage. 08/11/2022: The dorsal foot wound is about the same  size. It is a little bit dry with some slough accumulation. The right anterior tibial wound is also unchanged. The right medial leg wound is filling in further with good granulation tissue. Still with some slough buildup on  the surface. 08/18/2022: His left leg is bright red, hot, and swollen with cellulitis. The dorsal foot wound actually looks quite good and is superficial with just a little bit of slough accumulation. The right anterior tibial wound is unchanged. The right medial leg wound has filled in with good granulation tissue but has copious amounts of drainage. 08/25/2022: His cellulitis has responded nicely to the oral antibiotics. His dorsal foot wound is smaller but has a fair amount of slough buildup. The right anterior tibial wound remains stable and unchanged. The right medial leg wound has more granulation tissue under a layer of slough, but continues to have copious drainage. The drainage is actually causing skin irritation and threatens to result in breakdown. 09/01/2022: He continues to have profuse drainage from his wounds which is resulting in tissue maceration on both the dorsum of his left foot and the periwound distal to his medial leg wound. The dorsal foot wound is nearly healed, despite this. The medial right leg wound is filling in with good granulation tissue. We are JABORIS, [name] ([id number]) [id number] Page 11 of 17 working on getting home health assistance to change his dressings more frequently to try and control the drainage better. 09/08/2022: We have had success in controlling his drainage. The tissue maceration has improved significantly and his swelling has come down quite markedly. The medial right leg wound is much more superficial and has substantially less nonviable tissue present. The anterior tibial wound is a little bit larger, however with some nonviable tissue present. 09/15/2022: The dorsal foot wound is nearly closed. The anterior tibial wound measures slightly larger today. The medial right leg wound continues to improve quite dramatically with less nonviable tissue and more robust-looking granulation. Edema control is significantly  better. 09/22/2022: The left dorsal foot wound remains open, albeit quite tiny. The anterior tibial wound is a little bit larger again today. The medial right leg wound continues to improve with an increase in robust and healthy granulation tissue and less accumulation of nonviable tissue. 09/29/2022: His home health providers wrapped his left leg extremely tightly and he ended up having to cut the wrap off of his foot. This resulted in his foot being very swollen and that the dorsal foot wound is a little bit larger today. The anterior tibial wound is about the same size with a little slough on the surface. The medial right leg wound continues to fill in. There are very few areas with any significant depth. There is still slough accumulation. 3/13; Patient has a left dorsal foot wound along with a right anterior tibial wound and right medial wound. We are using silver alginate to the left dorsal foot wound, Santyl and sodium thiosulfate compound ointment to the right lower extremity wounds under 4 layer compression. Patient has no issues or complaints today. 10/13/2022: The left dorsal foot wound is a little bit bigger today, but his foot is also a little bit more swollen. The right medial leg wound has filled in more and has substantially less depth. There is still some nonviable tissue present. The small anterior lower leg wound looks about the same. 10/20/2022: The left dorsal foot wound is epithelializing. There does not appear to be much open at this site. There is some eschar around the edges.  The right medial leg wound continues to fill in with granulation tissue. The small anterior lower leg wound is a little bit bigger and there is a chunk of calcium protruding. He continues to have fairly copious drainage from the right leg. 10/27/2022: His home health agency failed to show up at all this past week and as a result, the excessive drainage from his right leg wound has resulted in tissue breakdown.  His leg is red and irritated. The wounds themselves look about the same. The wound on his left dorsal foot is nearly closed underneath some eschar. 11/03/2022: We continue to have difficulty with his home health agency. He continues to have significant drainage from his right leg wound which has resulted in even further tissue breakdown. He also was not taking his Lasix as prescribed and I think much of the drainage has been his lymphedema fluid choosing the path of least resistance. The wound on his left dorsal foot is closed, but in the process of cleaning the site, dry skin was pulled off and he has a new small superficial wound just adjacent to where the dorsal foot wound was. 11/12/2022: His left dorsal foot is completely healed. The right medial leg continues to deteriorate. He is not taking his Lasix as prescribed, nor is he using his lymphedema pumps at all. As a result, he has massive amounts of drainage saturating his dressings and causing further tissue breakdown on his medial leg. Today, his dressings had a foul odor, as well. 11/19/2022: His right medial leg looks even worse today. There is more erythema and moisture related tissue breakdown. The 2 anterior wounds look a little bit better. The culture that I took last week did produce Pseudomonas and he is currently taking levofloxacin for this. 11/26/2022: There has been significant improvement to his right medial leg. It is far less raw and many of the superficial open areas have epithelialized. He continues to cut the foot portion of his wrap and as result his foot is bulbous and swollen compared to the rest of his leg, where edema control is good. The smaller of the 2 anterior tibial wound is closed, while the larger is contracting somewhat with a bit of slough at the base. 12/03/2022: His leg continues to demonstrate improvement. There has been epithelialization of much of the denuded area. He has a new small anterior tibial wound that has  opened around a nidus of calcium. Edema control is improved. 12/10/2022: Continued improvement in the periwound skin. The anterior tibial wounds are about the same, but the medial leg wound has improved further. There is less nonviable tissue present. 12/17/2022: Between his visit on Tuesday with the nurse and today, he has had significantly more drainage which has resulted in some periwound tissue maceration. I suspect this correlates somewhat with his diuretic use, as he says he takes his diuretic religiously on Saturday, Sunday, and Monday and then does not take it after Tuesday due to being out and about and not being close to easy restroom access. I suspect the excess fluid is just simply draining out of his leg. 12/21/2022: This was supposed to be a nurse visit, but his leg has broken down so significantly, that we have converted to a wound care visit. He did take his diuretics over the weekend, but despite this, he has had copious drainage and all of his tissues have broken down substantially from the right medial leg ulcer all the way down to his ankle and even wrapping posterior  to this. There is blue-green staining on his dressing, suggesting ongoing presence of Pseudomonas aeruginosa. 12/24/2022: The wound already looks better than it did 2 days ago. He has been taking ciprofloxacin and the Urgo clean dressing we applied did a much better job of wicking the drainage away from his leg. There is slough on the entire surface of the medial leg wound, but the erythema and induration has improved in the past couple of days. 12/31/2022: Continued improvement of the wound. He says that the pain has basically abated since being on ciprofloxacin. Still with fairly heavy drainage and slough accumulation. 01/07/2023: Overall continued improvement. Still with heavy slough buildup, but less drainage. 01/14/2023: There is a little bit of improvement. The skin is still looking a bit excoriated. He has a few more  days left of ciprofloxacin. 01/21/2023: There has been significant improvement. The periwound skin is in much better condition. The ulcers are shallower and actually have some good granulation tissue present under some slough. 01/28/2023: The wound continues to improve. All of the open areas measured smaller today. There is slough accumulation on the surfaces. His drainage has decreased markedly. 02/04/2023: Continued improvement in the wound. Drainage remains significantly diminished. Slough accumulation is present but to a lesser extent. Periwound skin continues to improve. Edema control is good. 02/18/2023: The heavy drainage persists, although it is was not as significant today as it was at his nurse visit on Tuesday. The drainage is yellow and does not have the blue-green discoloration suggestive of Pseudomonas. The actual wounds on his leg are getting smaller, but the periwound skin is quite excoriated. 02/25/2023: His periwound skin is quite excoriated and he has a lot of clear yellow drainage. The main wounds are filling in further and epithelializing. 03/04/2023: The excoriation of the periwound skin has improved. Most of that area has dried up. The main wounds have less slough in them today. 03/11/2023: The main wounds have accumulated a little bit of slough. The periwound excoriation continues to improve. Unfortunately, he suffered a fall over the previous weekend and has abrasions on his knees bilaterally and on his right wrist and upper arm. In addition, the skin on his left lower leg apparently blistered DEONTRAE, FOLINO ([id number]) [id number] Page 12 of 17 and opened, leaving several superficial wounds on this extremity. He has been approved for the [facility] wound VAC and has the equipment with him today. Patient History Information obtained from Patient, Chart. Family History Cancer - Mother, Heart Disease - Father,Mother, Hypertension - Father,Siblings, Lung  Disease - Siblings, No family history of Diabetes, Hereditary Spherocytosis, Kidney Disease, Seizures, Stroke, Thyroid Problems, Tuberculosis. Social History Former smoker - quit 1991, Marital Status - Married, Alcohol Use - Moderate, Drug Use - No History, Caffeine Use - Daily - coffee. Medical History Eyes Patient has history of Cataracts - right eye removed Denies history of Glaucoma, Optic Neuritis Ear/Nose/Mouth/Throat Denies history of Chronic sinus problems/congestion, Middle ear problems Hematologic/Lymphatic Patient has history of Anemia - macrocytic, Lymphedema Cardiovascular Patient has history of Congestive Heart Failure, Deep Vein Thrombosis - left leg, Hypertension, Peripheral Venous Disease Endocrine Denies history of Type I Diabetes, Type II Diabetes Genitourinary Denies history of End Stage Renal Disease Integumentary (Skin) Denies history of History of Burn Oncologic Denies history of Received Chemotherapy, Received Radiation Psychiatric Denies history of Anorexia/bulimia, Confinement Anxiety Hospitalization/Surgery History - cervical fusion. - lumbar surgery. - coronary stent placement. - lasik eye surgery. - hydrocele excision. Medical A Surgical History Notes nd Constitutional Symptoms (  General Health) obesity Ear/Nose/Mouth/Throat hard of hearing Respiratory frozen diaphragm right Cardiovascular hyperlipidemia Musculoskeletal myositis, lumbar DDD, spinal stenosis, cervical facet joint syndrome Objective Constitutional Slightly hypertensive. no acute distress. Vitals Time Taken: 11:02 AM, Height: 71 in, Weight: 267 lbs, BMI: 37.2, Temperature: 98.1 F, Pulse: 66 bpm, Respiratory Rate: 18 breaths/min, Blood Pressure: 145/87 mmHg. Respiratory Normal work of breathing on room air. General Notes: 03/11/2023: The main wounds have accumulated a little bit of slough. The periwound excoriation continues to improve. Unfortunately, he suffered a fall over the  previous weekend and has abrasions on his knees bilaterally and on his right wrist and upper arm. In addition, the skin on his left lower leg apparently blistered and opened, leaving several superficial wounds on this extremity. Integumentary (Hair, Skin) Wound #3 status is Open. Original cause of wound was Gradually Appeared. The date acquired was: 12/07/2021. The wound has been in treatment 56 weeks. The wound is located on the Right,Medial Lower Leg. The wound measures 10cm length x 9cm width x 0.4cm depth; 70.686cm^2 area and 28.274cm^3 volume. There is Fat Layer (Subcutaneous Tissue) exposed. There is no tunneling or undermining noted. There is a large amount of serosanguineous drainage noted. The wound margin is indistinct and nonvisible. There is medium (34-66%) red, pink granulation within the wound bed. There is a medium (34-66%) amount of necrotic tissue within the wound bed including Adherent Slough. The periwound skin appearance had no abnormalities noted for moisture. The periwound skin appearance exhibited: Excoriation. The periwound skin appearance did not exhibit: Erythema. Periwound temperature was noted as No Abnormality. Wound #6 status is Open. Original cause of wound was Gradually Appeared. The date acquired was: 05/12/2022. The wound has been in treatment 43 weeks. The wound is located on the Right,Anterior Lower Leg. The wound measures 1cm length x 1.5cm width x 0.2cm depth; 1.178cm^2 area and 0.236cm^3 volume. There is Fat Layer (Subcutaneous Tissue) exposed. There is no tunneling or undermining noted. There is a medium amount of serous drainage noted. The wound margin is distinct with the outline attached to the wound base. There is small (1-33%) pink granulation within the wound bed. There is a large (67-100%) amount of necrotic tissue within the wound bed including Adherent Slough. The periwound skin appearance had no abnormalities noted for texture. The periwound skin  appearance had no abnormalities noted for moisture. The periwound skin appearance did not exhibit: Rubor, Erythema. Periwound temperature was noted as No Abnormality. [name], RATHGEB ([id number]) [id number] Page 13 of 17 Wound #7 status is Open. Original cause of wound was Blister. The date acquired was: 03/09/2023. The wound is located on the Left,Anterior Lower Leg. The wound measures 1.5cm length x 3cm width x 0.1cm depth; 3.534cm^2 area and 0.353cm^3 volume. There is no tunneling or undermining noted. There is a medium amount of serous drainage noted. The wound margin is distinct with the outline attached to the wound base. There is large (67-100%) red granulation within the wound bed. There is no necrotic tissue within the wound bed. The periwound skin appearance had no abnormalities noted for texture. The periwound skin appearance had no abnormalities noted for moisture. The periwound skin appearance had no abnormalities noted for color. Periwound temperature was noted as No Abnormality. 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 There was a Selective/Open Wound Non-Viable Tissue Debridement with a total area of 49.46 sq cm performed by Duanne Guess, MD. With the following instrument(s): Curette to remove Non-Viable tissue/material. Material removed  includes [facility] after achieving pain control using Lidocaine 4% Topical Solution. No specimens were taken. A time out was conducted at 11:18, prior to the start of the procedure. A Minimum amount of bleeding was controlled with Pressure. The procedure was tolerated well. Post Debridement Measurements: 10cm length x 9cm width x 0.4cm depth; 28.274cm^3 volume. Character of Wound/Ulcer Post Debridement is improved. Post procedure Diagnosis Wound #3: Same as Pre-Procedure General Notes: scribed for Dr. [name] by [name], RN. Pre-procedure diagnosis of Wound #3 is a Lymphedema located on the Right,Medial Lower Leg . There was a Three Layer Compression Therapy Procedure by [name], RN. 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 There was a Selective/Open Wound Non-Viable Tissue Debridement with a total area of 1.18 sq cm performed by Duanne Guess, MD. With the following instrument(s): Curette to remove Non-Viable tissue/material. Material removed includes [facility] after achieving pain control using Lidocaine 4% Topical Solution. No specimens were taken. A time out was conducted at 11:18, prior to the start of the procedure. A Minimum amount of bleeding was controlled with Pressure. The procedure was tolerated well. Post Debridement Measurements: 1cm length x 1.5cm width x 0.2cm depth; 0.236cm^3 volume. Character of Wound/Ulcer Post Debridement is improved. Post procedure Diagnosis Wound #6: Same as Pre-Procedure General Notes: scribed for Dr. [name] by [name], RN. Wound #7 Pre-procedure diagnosis of Wound #7 is a Lymphedema located on the Left,Anterior Lower Leg . There was a Three Layer Compression Therapy Procedure by [name], RN. Post procedure Diagnosis Wound #7: Same as Pre-Procedure Plan Follow-up Appointments: Return Appointment in  1 week. - Dr. [name] RM 1 Anesthetic: (In clinic) Topical Lidocaine 4% applied to wound bed Bathing/ Shower/ Hygiene: May shower with protection but do not get wound dressing(s) wet. Protect dressing(s) with water repellant cover (for example, large plastic bag) or a cast cover and may then take shower. Negative Presssure Wound Therapy: Wound Vac to wound continuously at 112mm/hg pressure Black Foam Edema Control - Lymphedema / SCD / Other: Lymphedema Pumps. Use Lymphedema pumps on leg(s) 2-3 times a day for 45-60 minutes. If wearing any wraps or hose, do not remove them. Continue exercising as instructed. Elevate legs to the level of the heart or above for 30 minutes daily and/or when sitting for 3-4 times a day throughout the day. - throughout the day Avoid standing for long periods of time. Compression stocking or Garment 20-30 mm/Hg pressure to: - juxtalite left leg daily Other Edema Control Orders/Instructions: - kerlix/coban wrap to left leg while wife recovers from surgery The following medication(s) was prescribed: lidocaine topical 4 % cream 0 0 cream topical was prescribed at facility ULISSES, RABINOWITZ ([id number]) [id number] Page 14 of 17 WOUND #3: - Lower Leg Wound Laterality: Right, Medial Peri-Wound Care: Sween Lotion (Moisturizing lotion) 1 x Per Week/30 Days Discharge Instructions: Apply moisturizing lotion as directed Prim Dressing: peel and place vac 1 x Per Week/30 Days ary  Com pression Wrap: ThreePress (3 layer compression wrap) 1 x Per Week/30 Days Discharge Instructions: Apply three layer compression as directed. WOUND #6: - Lower Leg Wound Laterality: Right, Anterior Peri-Wound Care: Sween Lotion (Moisturizing lotion) 1 x Per Week/30 Days Discharge Instructions: Apply moisturizing lotion as directed Prim Dressing: peel and place vac 1 x Per Week/30 Days ary Com pression Wrap: ThreePress (3 layer compression wrap) 1 x Per Week/30  Days Discharge Instructions: Apply three layer compression as directed. WOUND #7: - Lower Leg Wound Laterality: Left, Anterior Cleanser: Soap and Water 1 x Per Week/30 Days Discharge Instructions: May shower and wash wound with dial antibacterial soap and water prior to dressing change. Peri-Wound Care: Sween Lotion (Moisturizing lotion) 1 x Per Week/30 Days Discharge Instructions: Apply moisturizing lotion as directed Prim Dressing: Maxorb Extra Ag+ Alginate Dressing, 2x2 (in/in) 1 x Per Week/30 Days ary Discharge Instructions: Apply to wound bed as instructed Secondary Dressing: ABD Pad, 8x10 1 x Per Week/30 Days Discharge Instructions: Apply over primary dressing as directed. Com pression Wrap: ThreePress (3 layer compression wrap) 1 x Per Week/30 Days Discharge Instructions: Apply three layer compression as directed. 03/11/2023: The main wounds have accumulated a little bit of slough. The periwound excoriation continues to improve. Unfortunately, he suffered a fall over the previous weekend and has abrasions on his knees bilaterally and on his right wrist and upper arm. In addition, the skin on his left lower leg apparently blistered and opened, leaving several superficial wounds on this extremity. The abrasions he sustained in a small did not require any debridement today. We will apply silver alginate and foam border dressings to these areas. The new wounds on his left anterior tibial surface are also clean and did not require debridement. I used a curette to debride slough from the wounds on his right leg. We are going to apply the peel and press wound VAC to the right leg to see if this helps address the profuse drainage. Continue 3 layer compression wrap on this leg. We will apply silver alginate to the new wounds on his left anterior tibial surface and also use 3 layer compression on this leg. He will follow-up in 1 week. Electronic Signature(s) Signed: 03/13/2023 10:48:24 AM By:  Shawn Stall RN, BSN Signed: 03/14/2023 8:35:27 AM By: Duanne Guess MD FACS Previous Signature: 03/11/2023 11:36:22 AM Version By: Duanne Guess MD FACS Entered By: Shawn Stall on 03/13/2023 10:40:38 -------------------------------------------------------------------------------- HxROS Details Patient Name: Date of Service: BABACAR, BELL 03/11/2023 11:15 A M Medical Record Number: [id number] Patient Account Number: [id number] Date of Birth/Sex: Treating RN: [birth year] (72 y.o. M) Primary Care Provider: [name] Other Clinician: Referring Provider: Treating Provider/Extender: [name] in Treatment: 54 Information Obtained From Patient Chart Constitutional Symptoms (General Health) Medical History: Past Medical History Notes: obesity Eyes Medical History: Positive for: Cataracts - right eye removed Negative for: Glaucoma; Optic Neuritis Ear/Nose/Mouth/Throat Medical History: Negative for: Chronic sinus problems/congestion; Middle ear problems Past Medical History Notes: hard of hearing JEDIAH, GALLIS ([id number]) [id number] Page 15 of 17 Hematologic/Lymphatic Medical History: Positive for: Anemia - macrocytic; Lymphedema Respiratory Medical History: Past Medical History Notes: frozen diaphragm right Cardiovascular Medical History: Positive for: Congestive Heart Failure; Deep Vein Thrombosis - left leg; Hypertension; Peripheral Venous Disease Past Medical History Notes: hyperlipidemia Endocrine Medical History: Negative for: Type I Diabetes; Type II Diabetes Genitourinary Medical History: Negative for: End Stage Renal Disease Integumentary (Skin) Medical History: Negative for: History of Burn Musculoskeletal Medical History: Past Medical History  Notes: myositis, lumbar DDD, spinal stenosis, cervical facet joint syndrome Oncologic Medical History: Negative for: Received Chemotherapy; Received  Radiation Psychiatric Medical History: Negative for: Anorexia/bulimia; Confinement Anxiety HBO Extended History Items Eyes: Cataracts Immunizations Pneumococcal Vaccine: Received Pneumococcal Vaccination: Yes Received Pneumococcal Vaccination On or After 60th Birthday: Yes Implantable Devices No devices added Hospitalization / Surgery History Type of Hospitalization/Surgery cervical fusion lumbar surgery coronary stent placement lasik eye surgery hydrocele excision Family and Social History Cancer: Yes - Mother; Diabetes: No; Heart Disease: Yes - Father,Mother; Hereditary Spherocytosis: No; Hypertension: Yes - Father,Siblings; Kidney Disease: No; Lung Disease: Yes - Siblings; Seizures: No; Stroke: No; Thyroid Problems: No; Tuberculosis: No; Former smoker - quit 1991; Marital Status - Married; Alcohol Use: Moderate; Drug Use: No History; Caffeine Use: Daily - coffee; Financial Concerns: No; Food, Clothing or Shelter Needs: No; Support System Lacking: No; Transportation Concerns: No KERRY, BARKLOW ([id number]) [id number] Page 16 of 17 Electronic Signature(s) Signed: 03/11/2023 11:55:54 AM By: Duanne Guess MD FACS Entered By: Duanne Guess on 03/11/2023 11:34:01 -------------------------------------------------------------------------------- SuperBill Details Patient Name: Date of Service: RAAHIL, BUB 03/11/2023 Medical Record Number: [id number] Patient Account Number: [id number] Date of Birth/Sex: Treating RN: 07-06-1951 (72 y.o. 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) I89.0 Lymphedema, not elsewhere classified I10 Essential (primary) hypertension I25.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 angina pectoris Z86.718 Personal history of other venous thrombosis and embolism Facility Procedures : CPT4 Code: 44010272 Description: 97597 - DEBRIDE WOUND 1ST 20 SQ CM OR < ICD-10 Diagnosis Description 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ed Modifier: Quantity: 1 : CPT4 Code: 53664403 Description: 97598 - DEBRIDE WOUND EA ADDL 20 SQ CM ICD-10 Diagnosis Description 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ed Modifier: Quantity: 2 : CPT4 Code: [id number] Description: (Facility Use Only) [id number] - APPLY MULTLAY COMPRS LWR LT LEG ICD-10 Diagnosis Description L97.821 Non-pressure chronic ulcer of other part of left lower leg limited to breakdown of ski Modifier: 59 n Quantity: 1 Physician Procedures : CPT4 Code Description Modifier [id number] 99214 - WC PHYS LEVEL 4 - EST PT 25 ICD-10 Diagnosis Description 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ed L97.821 Non-pressure chronic ulcer of other part of left  lower leg limited to breakdown of skin L98.492 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

## 2023-03-15 ENCOUNTER — Ambulatory Visit (HOSPITAL_BASED_OUTPATIENT_CLINIC_OR_DEPARTMENT_OTHER): Payer: Medicare Other | Admitting: General Surgery

## 2023-03-18 ENCOUNTER — Encounter (HOSPITAL_BASED_OUTPATIENT_CLINIC_OR_DEPARTMENT_OTHER): Payer: Medicare Other | Admitting: General Surgery

## 2023-03-18 DIAGNOSIS — L97812 Non-pressure chronic ulcer of other part of right lower leg with fat layer exposed: Secondary | ICD-10-CM | POA: Diagnosis not present

## 2023-03-18 DIAGNOSIS — L97821 Non-pressure chronic ulcer of other part of left lower leg limited to breakdown of skin: Secondary | ICD-10-CM | POA: Diagnosis not present

## 2023-03-18 DIAGNOSIS — L942 Calcinosis cutis: Secondary | ICD-10-CM | POA: Diagnosis not present

## 2023-03-18 DIAGNOSIS — I890 Lymphedema, not elsewhere classified: Secondary | ICD-10-CM | POA: Diagnosis not present

## 2023-03-18 DIAGNOSIS — L98492 Non-pressure chronic ulcer of skin of other sites with fat layer exposed: Secondary | ICD-10-CM | POA: Diagnosis not present

## 2023-03-18 DIAGNOSIS — I872 Venous insufficiency (chronic) (peripheral): Secondary | ICD-10-CM | POA: Diagnosis not present

## 2023-03-18 DIAGNOSIS — L97521 Non-pressure chronic ulcer of other part of left foot limited to breakdown of skin: Secondary | ICD-10-CM | POA: Diagnosis not present

## 2023-03-18 NOTE — Progress Notes (Signed)
CHANDARA, RORRER ([id number]) [id number] Page 1 of 18 Visit Report for 03/18/2023 Chief Complaint Document Details Patient Name: Date of Service: [name], [name] 03/18/2023 11:00 A M Medical Record Number: [id number] Patient Account Number: [id number] Date of Birth/Sex: Treating RN: 09/18/1950 (72 y.o. M) Primary Care Provider: [name] Other Clinician: Referring Provider: Treating Provider/Extender: [name] in Treatment: 1 Information Obtained from: Patient Chief Complaint Patient seen for complaints of Non-Healing Wounds. Electronic Signature(s) Signed: 03/18/2023 12:26:14 PM By: Duanne Guess MD FACS Entered By: Duanne Guess on 03/18/2023 09:26:14 -------------------------------------------------------------------------------- Debridement Details Patient Name: Date of Service: [name] 03/18/2023 11:00 A M Medical Record Number: [id number] Patient Account Number: [id number] Date of Birth/Sex: Treating RN: [birth year] (72 y.o. [name] Primary Care Provider: [name] Other Clinician: Referring Provider: Treating Provider/Extender: [name] in Treatment: 57 Debridement Performed for Assessment: Wound #7 Left,Anterior Lower Leg Performed By: Physician Duanne Guess, MD Debridement Type: Debridement Level of Consciousness (Pre-procedure): Awake and Alert Pre-procedure Verification/Time Out Yes - 11:45 Taken: Start Time: 11:46 Pain Control: Lidocaine 4% T opical Solution Percent of Wound Bed Debrided: 100% T Area Debrided (cm): otal 2.36 Tissue and other material debrided: Non-Viable, Slough, Skin: Epidermis, Slough Level: Skin/Epidermis Debridement Description: Selective/Open Wound Instrument: Curette Bleeding: Minimum Hemostasis Achieved: Pressure Procedural Pain: 0 Post Procedural Pain: 0 Response to Treatment: Procedure was tolerated well Level of  Consciousness (Post- Awake and Alert procedure): Post Debridement Measurements of Total Wound Length: (cm) 2 Width: (cm) 1.5 Depth: (cm) 0.1 Volume: (cm) 0.236 Character of Wound/Ulcer Post Debridement: Improved Post Procedure Diagnosis MAYANK, BARDER ([id number]) [id number] Page 2 of 18 Same as Pre-procedure Electronic Signature(s) Signed: 03/18/2023 12:34:12 PM By: Zenaida Deed RN, BSN Signed: 03/18/2023 12:35:56 PM By: Duanne Guess MD FACS Entered By: Zenaida Deed on 03/18/2023 08:49:07 -------------------------------------------------------------------------------- Debridement Details Patient Name: Date of Service: [name], [name] 03/18/2023 11:00 A M Medical Record Number: [id number] Patient Account Number: [id number] Date of Birth/Sex: Treating RN: 03/08/1951 (72 y.o. [name] Primary Care Provider: [name] Other Clinician: Referring Provider: Treating Provider/Extender: [name] in Treatment: 57 Debridement Performed for Assessment: Wound #8 Left,Dorsal Foot Performed By: Physician Duanne Guess, MD Debridement Type: Debridement Level of Consciousness (Pre-procedure): Awake and Alert Pre-procedure Verification/Time Out Yes - 11:45 Taken: Start Time: 11:46 Pain Control: Lidocaine 4% T opical Solution Percent of Wound Bed Debrided: 100% T Area Debrided (cm): otal 0.2 Tissue and other material debrided: Non-Viable, Slough, Slough Level: Non-Viable Tissue Debridement Description: Selective/Open Wound Instrument: Curette Bleeding: Minimum Hemostasis Achieved: Pressure Procedural Pain: 0 Post Procedural Pain: 0 Response to Treatment: Procedure was tolerated well Level of Consciousness (Post- Awake and Alert procedure): Post Debridement Measurements of Total Wound Length: (cm) 0.5 Width: (cm) 0.5 Depth: (cm) 0.1 Volume: (cm) 0.02 Character of Wound/Ulcer Post Debridement:  Improved Post Procedure Diagnosis Same as Pre-procedure Electronic Signature(s) Signed: 03/18/2023 12:34:12 PM By: Zenaida Deed RN, BSN Signed: 03/18/2023 12:35:56 PM By: Duanne Guess MD FACS Entered By: Zenaida Deed on 03/18/2023 08:49:51 -------------------------------------------------------------------------------- Debridement Details Patient Name: Date of Service: [name] 03/18/2023 11:00 A M Medical Record Number: [id number] Patient Account Number: [id number] Date of Birth/Sex: Treating RN: [birth year] (72 y.o. [name] Primary Care Provider: [name] Other Clinician: DILLON, HILLIE ([id number]) [id number] Page 3 of 18 Referring Provider: Treating Provider/Extender: [name] in Treatment: 57 Debridement Performed for Assessment: Wound #3 Right,Medial Lower Leg Performed By: Physician Duanne Guess, MD Debridement Type: Debridement  Level of Consciousness (Pre-procedure): Awake and Alert Pre-procedure Verification/Time Out Yes - 11:45 Taken: Start Time: 11:46 Pain Control: Lidocaine 4% T opical Solution Percent of Wound Bed Debrided: 80% T Area Debrided (cm): otal 50.24 Tissue and other material debrided: Non-Viable, Slough, Slough Level: Non-Viable Tissue Debridement Description: Selective/Open Wound Instrument: Curette Bleeding: Minimum Hemostasis Achieved: Pressure Procedural Pain: 0 Post Procedural Pain: 0 Response to Treatment: Procedure was tolerated well Level of Consciousness (Post- Awake and Alert procedure): Post Debridement Measurements of Total Wound Length: (cm) 10 Width: (cm) 8 Depth: (cm) 0.2 Volume: (cm) 12.566 Character of Wound/Ulcer Post Debridement: Improved Post Procedure Diagnosis Same as Pre-procedure Electronic Signature(s) Signed: 03/18/2023 12:34:12 PM By: Zenaida Deed RN, BSN Signed: 03/18/2023 12:35:56 PM By: Duanne Guess MD FACS Entered By:  Zenaida Deed on 03/18/2023 08:51:02 -------------------------------------------------------------------------------- HPI Details Patient Name: Date of Service: [name] 03/18/2023 11:00 A M Medical Record Number: [id number] Patient Account Number: [id number] Date of Birth/Sex: Treating RN: 1951-07-07 (72 y.o. M) Primary Care Provider: [name] Other Clinician: Referring Provider: Treating Provider/Extender: [name] in Treatment: 33 History of Present Illness HPI Description: ADMISSION 02/09/2022 This is a 72 year old man who recently moved to [place] from [place]. He has a history of of coronary artery disease and prior left popliteal vein DVT . He is not diabetic and does not smoke. 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 He has had multiple spinal surgeries and has limited mobility. He primarily uses a motorized wheelchair. He has had lymphedema for many years and does have lymphedema pumps although he says he does not use them frequently. He has wounds on his bilateral lower extremities. He was receiving care in [place] for these prior to his move. They were apparently packing his wounds with Betadine soaked gauze. He has worn compression wraps in the past but not recently. He did say that they helped tremendously. In clinic today, his right ABI is noncompressible but has good signals; the left ABI was 1.17. No formal vascular studies have been performed. On his left dorsal foot, there is a circular lesion that exposes the fat layer. There is fairly heavy slough accumulation within the wound, but no significant odor. On his right medial calf, he has multiple pinholes that have slough buildup in them and probe for about 2 to 4 mm in depth. They are draining clear fluid. On his right lateral midfoot, he has ulceration consistent with venous stasis. It is geographic and has slough accumulation. He has changes in his lower  extremities consistent with stasis dermatitis, but no hemosiderin deposition or thickening of the skin. 02/17/2022: All of the wounds are little bit larger today and have more slough accumulation. Today, the medial calf openings are larger and probe to something that feels calcified. There is odor coming from his wounds. His vascular studies are scheduled for August 9 and 10. 02-24-2022 upon evaluation today patient presents for follow-up concerning his ongoing issues here with his lower extremities. With that being said he does have significant problems with what appears to be cellulitis unfortunately the PCR culture that was sent last week got crushed by [employer] in transit. With that being said DU, TEPLY ([id number]) [id number] Page 4 of 18 we are going to reobtain a culture today although I am actually to do it on the right medial leg as this seems to be the most inflamed area today where there is some questionable drainage as well. I am going to remove  some of the calcium deposits which are loosening obtain a deeper culture from this area. Fortunately I do not see any evidence of active infection systemically which is good news but locally I think we are having a bigger issue here. He does have his appointment next Thursday with vascular. Patient has also been referred to [facility] for further evaluation and treatment of the calcinosis for possible sulfate injections. 03/04/2022: The culture that was performed last week grew out multiple species including Klebsiella, Morganella, and Pseudomonas aeruginosa. He had been prescribed Bactrim but this was not adequate to cover all of the species and levofloxacin was recommended. He completed the Bactrim but did not pick up the levofloxacin and begin taking it until yesterday. 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 He had arterial studies performed today. He does have evidence of peripheral arterial disease. Results copied here: +-------+-----------+-----------+------------+------------+ ABI/TBIT oday's ABIT oday's TBIPrevious ABIPrevious TBI +-------+-----------+-----------+------------+------------+ Right [place] 0.75    +-------+-----------+-----------+------------+------------+ Left [place] 0.57    +-------+-----------+-----------+------------+------------+ Arterial wall calcification precludes accurate ankle pressures and ABIs. Summary: Right: Resting right ankle-brachial index indicates noncompressible right lower extremity arteries. The right toe-brachial index is normal. Left: Resting left ankle-brachial index indicates noncompressible left lower extremity arteries. The left toe-brachial index is abnormal. He continues to have further tissue breakdown at the right medial calf and there is ample slough accumulation along with necrotic subcutaneous tissue. The left dorsal foot wound has built up slough, as well. The right medial foot wound is nearly closed with just a light layer of eschar over the surface. The right dorsal lateral foot wound has also accumulated a fair amount of slough and remains quite tender. 03/10/2022: He has been taking the prescribed levofloxacin and has about 3 days left of this. The erythema and induration on his right leg has improved. He continues to experience further breakdown of the right medial calf wound. There is extensive slough and debris accumulation there. There is heavy slough on his left dorsal foot wound, but no odor or purulent drainage. The right medial foot wound appears to be closed. The right dorsal lateral foot wound also has a fair amount of slough but it is less tender than at our last visit. He still does not have an appointment with dermatology. 03/22/2022: The right anterior tibial  wound has closed. The right lateral foot wound is nearly closed with just a little bit of slough. The left dorsal foot wound is smaller and continues to have some slough buildup but less so than at prior visits. There is good granulation tissue underlying the slough. Unfortunately the right medial calf wound continues to deteriorate. It has a foul odor today and a lot of necrotic tissue, the surrounding skin is also erythematous and moist. 03/30/2022: The right lateral foot wound continues to contract and just has a little bit of slough and eschar present. The left dorsal foot wound is also smaller with slough overlying good granulation tissue. The right medial calf wound actually looks quite a bit better today; there is no odor and there is much less necrotic tissue although some remains present. We have been using topical gentamicin and he has been taking oral Augmentin. 04/07/2022: The patient saw dermatology at [facility] last week. Unfortunately, it appears that they did not read any of the documentation that was provided. 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 Overall, it was a highly unsatisfactory consultation. In the meantime, however, we were able to identify compounding pharmacy who could create a topical sodium thiosulfate ointment. The patient has that with him today. Both of the right foot wounds are now closed. The left dorsal foot wound has contracted but still has a layer of slough on the surface. The medial calf wounds on the right all look a little bit better. They still have heavy slough along with the chunks of calcium. Everything is stained purple. 04/14/2022: The wound on his dorsal left foot is a little bit  smaller again today. There is still slough accumulation on the surface. The medial calf wounds have quite a bit less slough accumulation today. His right leg, however, is warm and erythematous, concerning for cellulitis. 04/14/2022: He completed his course of Augmentin yesterday. The medial calf wound sites are much cleaner today and shallower. There is less fragmented calcium in the wounds. He has a lot of lymphedema fluid-like drainage from these wounds, but no purulent discharge or malodor. The wound on his dorsal left foot has also contracted and is shallower. There is a layer of slough over healthy granulation tissue. 05/05/2022: The left dorsal foot wound is smaller today with less slough and more granulation tissue. The right medial calf wounds are shallower, but have extensive slough and some fat necrosis. The drainage has diminished considerably, however. 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 05/12/2022: The left dorsal foot wound continues to contract and fill with granulation tissue. There is slough on the wound surface. The right medial calf wounds also continue to fill with healthy tissue, but there is remaining slough and fat necrosis present. He had a significant increase in his drainage this week and it was a bright yellow-green color. He also had a new wound open over his right anterior tibial surface associated with the spike of cutaneous calcium. The leg is more red, as well, concerning for infection. 05/19/2022: His culture returned positive for Pseudomonas. He is currently taking a course of oral levofloxacin. We have also been using topical gentamicin mixed in with his sodium thiosulfate. All of the wounds look better this week. 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 There is slough and nonviable subcutaneous tissue still  present. The new anterior tibial wound just has a light layer of slough. The dorsal foot wound also has some slough present and appears a little dry today. 05/26/2022: The right medial leg wound continues to improve. It is feeling with granulation tissue, but still accumulates a fairly thick layer of slough on the surface. The more recent anterior tibial wound has remained quite small, but also has some slough on the surface. The left dorsal foot wound has contracted somewhat and has perimeter epithelialization. He completed his oral levofloxacin. 06/02/2022: The left dorsal foot wound continues to improve significantly. There is just a little slough on the wound surface. The right medial leg wound had black drainage, but it looks like silver alginate was applied last week and I theorized that silver sulfide was created with a combination of the silver alginate and the  sodium thiosulfate. It did, however, have a bit of an odor to it and extensive slough accumulation. The small anterior tibial wound also had a bit of slough and nonviable subcutaneous tissue present. The skin of his leg was rather macerated, as well. 06/09/2022: The left dorsal foot wound is smaller again this week with just a light layer of slough on the surface. The right medial leg wound looks substantially better. The leg and periwound skin are no longer red and macerated. There is good granulation tissue forming with less slough and nonviable tissue. The anterior tibial wound just has a small amount of slough accumulation. MARISELA, SALTARELLI ([id number]) [id number] Page 5 of 18 06/16/2022: The left dorsal foot wound continues to contract. His wrap slipped a little bit, however, and his foot is more swollen. The right medial leg wound continues to improve. The underlying tissue is more vital-appearing and there is less slough. He does have substantial drainage. The small anterior tibial wound has a bit of slough  accumulation. 06/23/2022: The left dorsal foot wound is about half the size as it was last week. There is just some light slough on the surface and some eschar buildup around the edges. The right anterior leg wound is small with a little slough on the surface. Unfortunately, the right medial leg wound deteriorated fairly substantially. It is malodorous with gray/green/black discoloration. 06/30/2022: The left dorsal foot wound continues to contract. It is nearly closed with just a light layer of slough present. The right anterior leg wound is about the same size and has a small amount of slough on the surface. The right medial leg wound looks a lot better than it did last week. The odor has abated and the tissue is less pulpy and necrotic-looking. There is still a fairly thick layer of slough present. 12/13; the left dorsal foot wound looks very healthy. There was no need to change the dressing here and no debridement. He has a small area on the right anterior lower leg apparently had not changed much in size. The area on the medial leg is the most problematic area this is large with some depth and a completely nonviable surface today. 07/14/2022: The left dorsal foot wound is smaller today with just a little slough and eschar present. The right anterior lower leg wound is about the same without any significant change. The medial leg wound has a black and green surface and is malodorous. No purulent drainage and the periwound skin is intact. Edema control is suboptimal. 07/21/2022: The dorsal foot wound continues to contract and is nearly closed with just a little bit of slough and eschar on the surface. The right anterior lower leg wound is shallower today but otherwise the dimensions are unchanged. The medial leg wound looks significantly better although it still has a nonviable surface; the odor and black/green surface has resolved. His culture came back with multiple species sensitive to Augmentin,  which was prescribed and a very low level of Pseudomonas, which should be handled by the topical gentamicin we have been using. 07/28/2022: The dorsal foot wound is down to just a very tiny opening with a little eschar on the surface. The right anterior tibial wound is about the same with some slough buildup. The right medial leg wound looks quite a bit better this week. There is thick rubbery slough on the surface, but the odor and drainage has improved significantly. 08/04/2022: The dorsal foot wound is almost healed, but the periwound skin has broken down. This  appears to be secondary to moisture and adhesive from the absorbent pad. The right anterior tibial wound is shallower. The right medial leg wound continues to improve. It is smaller, still with rubbery slough on the surface. No malodor or purulent drainage. 08/11/2022: The dorsal foot wound is about the same size. It is a little bit dry with some slough accumulation. The right anterior tibial wound is also unchanged. The right medial leg wound is filling in further with good granulation tissue. Still with some slough buildup on the surface. 08/18/2022: His left leg is bright red, hot, and swollen with cellulitis. The dorsal foot wound actually looks quite good and is superficial with just a little bit of slough accumulation. The right anterior tibial wound is unchanged. The right medial leg wound has filled in with good granulation tissue but has copious amounts of drainage. 08/25/2022: His cellulitis has responded nicely to the oral antibiotics. His dorsal foot wound is smaller but has a fair amount of slough buildup. The right anterior tibial wound remains stable and unchanged. The right medial leg wound has more granulation tissue under a layer of slough, but continues to have copious drainage. The drainage is actually causing skin irritation and threatens to result in breakdown. 09/01/2022: He continues to have profuse drainage from his wounds  which is resulting in tissue maceration on both the dorsum of his left foot and the periwound distal to his medial leg wound. The dorsal foot wound is nearly healed, despite this. The medial right leg wound is filling in with good granulation tissue. We are working on getting home health assistance to change his dressings more frequently to try and control the drainage better. 09/08/2022: We have had success in controlling his drainage. The tissue maceration has improved significantly and his swelling has come down quite markedly. The medial right leg wound is much more superficial and has substantially less nonviable tissue present. The anterior tibial wound is a little bit larger, however with some nonviable tissue present. 09/15/2022: The dorsal foot wound is nearly closed. The anterior tibial wound measures slightly larger today. The medial right leg wound continues to improve quite dramatically with less nonviable tissue and more robust-looking granulation. Edema control is significantly better. 09/22/2022: The left dorsal foot wound remains open, albeit quite tiny. The anterior tibial wound is a little bit larger again today. The medial right leg wound continues to improve with an increase in robust and healthy granulation tissue and less accumulation of nonviable tissue. 09/29/2022: His home health providers wrapped his left leg extremely tightly and he ended up having to cut the wrap off of his foot. This resulted in his foot being very swollen and that the dorsal foot wound is a little bit larger today. The anterior tibial wound is about the same size with a little slough on the surface. The medial right leg wound continues to fill in. There are very few areas with any significant depth. There is still slough accumulation. 3/13; Patient has a left dorsal foot wound along with a right anterior tibial wound and right medial wound. We are using silver alginate to the left dorsal foot wound, Santyl and  sodium thiosulfate compound ointment to the right lower extremity wounds under 4 layer compression. Patient has no issues or complaints today. 10/13/2022: The left dorsal foot wound is a little bit bigger today, but his foot is also a little bit more swollen. The right medial leg wound has filled in more and has substantially less depth.  There is still some nonviable tissue present. The small anterior lower leg wound looks about the same. 10/20/2022: The left dorsal foot wound is epithelializing. There does not appear to be much open at this site. There is some eschar around the edges. The right medial leg wound continues to fill in with granulation tissue. The small anterior lower leg wound is a little bit bigger and there is a chunk of calcium protruding. He continues to have fairly copious drainage from the right leg. 10/27/2022: His home health agency failed to show up at all this past week and as a result, the excessive drainage from his right leg wound has resulted in tissue breakdown. His leg is red and irritated. The wounds themselves look about the same. The wound on his left dorsal foot is nearly closed underneath some eschar. 11/03/2022: We continue to have difficulty with his home health agency. He continues to have significant drainage from his right leg wound which has resulted in even further tissue breakdown. He also was not taking his Lasix as prescribed and I think much of the drainage has been his lymphedema fluid choosing the path of least resistance. The wound on his left dorsal foot is closed, but in the process of cleaning the site, dry skin was pulled off and he has a new small superficial wound just adjacent to where the dorsal foot wound was. 11/12/2022: His left dorsal foot is completely healed. The right medial leg continues to deteriorate. He is not taking his Lasix as prescribed, nor is he using his lymphedema pumps at all. As a result, he has massive amounts of drainage  saturating his dressings and causing further tissue breakdown on his medial leg. Today, his dressings had a foul odor, as well. 11/19/2022: His right medial leg looks even worse today. There is more erythema and moisture related tissue breakdown. The 2 anterior wounds look a little bit better. The culture that I took last week did produce Pseudomonas and he is currently taking levofloxacin for this. 11/26/2022: There has been significant improvement to his right medial leg. It is far less raw and many of the superficial open areas have epithelialized. He continues to cut the foot portion of his wrap and as result his foot is bulbous and swollen compared to the rest of his leg, where edema control is good. The smaller of the 2 anterior tibial wound is closed, while the larger is contracting somewhat with a bit of slough at the base. JONELL, TRICKETT ([id number]) [id number] Page 6 of 18 12/03/2022: His leg continues to demonstrate improvement. There has been epithelialization of much of the denuded area. He has a new small anterior tibial wound that has opened around a nidus of calcium. Edema control is improved. 12/10/2022: Continued improvement in the periwound skin. The anterior tibial wounds are about the same, but the medial leg wound has improved further. There is less nonviable tissue present. 12/17/2022: Between his visit on Tuesday with the nurse and today, he has had significantly more drainage which has resulted in some periwound tissue maceration. I suspect this correlates somewhat with his diuretic use, as he says he takes his diuretic religiously on Saturday, Sunday, and Monday and then does not take it after Tuesday due to being out and about and not being close to easy restroom access. I suspect the excess fluid is just simply draining out of his leg. 12/21/2022: This was supposed to be a nurse visit, but his leg has broken down so significantly, that  we have converted  to a wound care visit. He did take his diuretics over the weekend, but despite this, he has had copious drainage and all of his tissues have broken down substantially from the right medial leg ulcer all the way down to his ankle and even wrapping posterior to this. There is blue-green staining on his dressing, suggesting ongoing presence of Pseudomonas aeruginosa. 12/24/2022: The wound already looks better than it did 2 days ago. He has been taking ciprofloxacin and the Urgo clean dressing we applied did a much better job of wicking the drainage away from his leg. There is slough on the entire surface of the medial leg wound, but the erythema and induration has improved in the past couple of days. 12/31/2022: Continued improvement of the wound. He says that the pain has basically abated since being on ciprofloxacin. Still with fairly heavy drainage and slough accumulation. 01/07/2023: Overall continued improvement. Still with heavy slough buildup, but less drainage. 01/14/2023: There is a little bit of improvement. The skin is still looking a bit excoriated. He has a few more days left of ciprofloxacin. 01/21/2023: There has been significant improvement. The periwound skin is in much better condition. The ulcers are shallower and actually have some good granulation tissue present under some slough. 01/28/2023: The wound continues to improve. All of the open areas measured smaller today. There is slough accumulation on the surfaces. His drainage has decreased markedly. 02/04/2023: Continued improvement in the wound. Drainage remains significantly diminished. Slough accumulation is present but to a lesser extent. Periwound skin continues to improve. Edema control is good. 02/18/2023: The heavy drainage persists, although it is was not as significant today as it was at his nurse visit on Tuesday. The drainage is yellow and does not have the blue-green discoloration suggestive of Pseudomonas. The actual wounds  on his leg are getting smaller, but the periwound skin is quite excoriated. 02/25/2023: His periwound skin is quite excoriated and he has a lot of clear yellow drainage. The main wounds are filling in further and epithelializing. 03/04/2023: The excoriation of the periwound skin has improved. Most of that area has dried up. The main wounds have less slough in them today. 03/11/2023: The main wounds have accumulated a little bit of slough. The periwound excoriation continues to improve. Unfortunately, he suffered a fall over the previous weekend and has abrasions on his knees bilaterally and on his right wrist and upper arm. In addition, the skin on his left lower leg apparently blistered and opened, leaving several superficial wounds on this extremity. He has been approved for the [facility] wound VAC and has the equipment with him today. 03/18/2023: He has extensive excoriation to the periwound skin on the right medial lower leg. The actual wounds themselves are smaller with slough accumulation. The top of his left foot has begun weeping again. Using the [facility] wound VAC, he says that he through 4 canisters. He seems to be very fluid overloaded, without any significant dyspnea. Electronic Signature(s) Signed: 03/18/2023 12:28:12 PM By: Duanne Guess MD FACS Entered By: Duanne Guess on 03/18/2023 09:28:11 -------------------------------------------------------------------------------- Physical Exam Details Patient Name: Date of Service: [name], [name] 03/18/2023 11:00 A M Medical Record Number: [id number] Patient Account Number: [id number] Date of Birth/Sex: Treating RN: Jan 04, 1951 (72 y.o. M) Primary Care Provider: [name] Other Clinician: Referring Provider: Treating Provider/Extender: [name] in Treatment: 70 Constitutional Hypertensive, asymptomatic. . . . no acute distress. Respiratory Normal work of breathing on  room  air. Notes 03/18/2023: He has extensive excoriation to the periwound skin on the right medial lower leg. The actual wounds themselves are smaller with slough accumulation. The top of his left foot has begun weeping again. IMER, ANTONETTI ([id number]) [id number] Page 7 of 18 Electronic Signature(s) Signed: 03/18/2023 12:29:58 PM By: Duanne Guess MD FACS Entered By: Duanne Guess on 03/18/2023 09:29:58 -------------------------------------------------------------------------------- Physician Orders Details Patient Name: Date of Service: CAVION, RIDLEY [name] 03/18/2023 11:00 A M Medical Record Number: [id number] Patient Account Number: [id number] Date of Birth/Sex: Treating RN: Jan 21, 1951 (72 y.o. 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 - Dr. [name] RM 1 Return A Friday 8/30 @ 11:00 am Nurse Visit: - Tues. 8/27 @ 11:15 am Anesthetic (In clinic) Topical Lidocaine 4% applied to wound bed Bathing/ Shower/ Hygiene May shower with protection but do not get wound dressing(s) wet. Protect dressing(s) with water repellant cover (for example, large  plastic bag) or a cast cover and may then take shower. Negative Presssure Wound Therapy Wound Vac to wound continuously at 115mm/hg pressure Black Foam Edema Control - Lymphedema / SCD / Other Lymphedema Pumps. Use Lymphedema pumps on leg(s) 2-3 times a day for 45-60 minutes. If wearing any wraps or hose, do not remove them. Continue exercising as instructed. Elevate legs to the level of the heart or above for 30 minutes daily and/or when sitting for 3-4 times a day throughout the day. - throughout the day Avoid standing for long periods of time. Compression stocking or Garment 20-30 mm/Hg pressure to: - juxtalite left leg daily Other Edema Control Orders/Instructions: - kerlix/coban wrap to left leg while wife recovers from surgery Wound Treatment Wound #3 - Lower Leg Wound Laterality: Right, Medial Peri-Wound Care: Skin Prep 1 x Per Week/30 Days Discharge Instructions: cavelon to macerated wound margins Peri-Wound Care: Sween Lotion (Moisturizing lotion) 1 x Per Week/30 Days Discharge Instructions: Apply moisturizing lotion as directed Prim Dressing: peel and place vac ary 1 x Per Week/30 Days Compression Wrap: ThreePress (3 layer compression wrap) 1 x Per Week/30 Days Discharge Instructions: Apply three layer compression as directed. KALIQ, [place] ([id number]) [id number] Page 8 of 18 Wound #6 - Lower Leg Wound Laterality: Right, Anterior Peri-Wound Care: Sween Lotion (Moisturizing lotion) 1 x Per Week/30 Days Discharge Instructions: Apply moisturizing lotion as directed Prim Dressing: peel and place vac ary 1 x Per Week/30 Days Compression Wrap: ThreePress (3 layer compression wrap) 1 x Per Week/30 Days Discharge Instructions: Apply three layer compression as directed. Wound #7 - Lower Leg Wound Laterality: Left, Anterior Cleanser: Soap and Water 1 x Per Week/30 Days Discharge Instructions: May shower and wash wound with dial antibacterial soap and water  prior to dressing change. Peri-Wound Care: Sween Lotion (Moisturizing lotion) 1 x Per Week/30 Days Discharge Instructions: Apply moisturizing lotion as directed Prim Dressing: Maxorb Extra Ag+ Alginate Dressing,  2x2 (in/in) 1 x Per Week/30 Days ary Discharge Instructions: Apply to wound bed as instructed Secondary Dressing: ABD Pad, 8x10 1 x Per Week/30 Days Discharge Instructions: Apply over primary dressing as directed. Compression Wrap: ThreePress (3 layer compression wrap) 1 x Per Week/30 Days Discharge Instructions: Apply three layer compression as directed. Wound #8 - Foot Wound Laterality: Dorsal, Left Cleanser: Soap and Water 1 x Per Week/30 Days Discharge Instructions: May shower and wash wound with dial antibacterial soap and water prior to dressing change. Peri-Wound Care: Sween Lotion (Moisturizing lotion) 1 x Per Week/30 Days Discharge Instructions: Apply moisturizing lotion as directed Prim Dressing: Maxorb Extra Ag+ Alginate Dressing, 2x2 (in/in) 1 x Per Week/30 Days ary Discharge Instructions: Apply to wound bed as instructed Secondary Dressing: ABD Pad, 8x10 1 x Per Week/30 Days Discharge Instructions: Apply over primary dressing as directed. Compression Wrap: ThreePress (3 layer compression wrap) 1 x Per Week/30 Days Discharge Instructions: Apply three layer compression as directed. Electronic Signature(s) Signed: 03/18/2023 12:35:56 PM By: Duanne Guess MD FACS Entered By: Duanne Guess on 03/18/2023 09:30:41 -------------------------------------------------------------------------------- Problem List Details Patient Name: Date of Service: [name] 03/18/2023 11:00 A M Medical Record Number: [id number] Patient Account Number: [id number] Date of Birth/Sex: Treating RN: [birth year] (72 y.o. [name] Primary Care Provider: [name] Other Clinician: Referring Provider: Treating Provider/Extender: [name] in  Treatment: 67 Active Problems ICD-10 Encounter Code Description Active Date MDM Diagnosis L97.812 Non-pressure chronic ulcer of other part of right lower leg with fat layer 02/09/2022 No Yes exposed L97.821 Non-pressure chronic ulcer of other part of left lower leg limited to breakdown 03/11/2023 No Yes JADELL, [name] ([id number]) [id number] Page 9 of 18 of skin L97.521 Non-pressure chronic ulcer of other part of left foot limited to breakdown of 03/18/2023 No Yes skin 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 02/09/2022 No Yes 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) 02/09/2022 No Yes I89.0 Lymphedema, not elsewhere classified 02/09/2022 No Yes I10 Essential (primary) hypertension 02/09/2022 No Yes I25.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 angina pectoris 02/09/2022 No Yes Z86.718 Personal history of other venous thrombosis and embolism 02/09/2022 No Yes Inactive Problems ICD-10 Code Description Active Date Inactive Date L97.512 Non-pressure chronic ulcer of other part of right foot with fat layer exposed 02/17/2022 02/17/2022 Resolved Problems ICD-10 Code Description Active Date Resolved Date L98.492 Non-pressure chronic ulcer of skin of other sites with fat layer exposed 03/11/2023 03/11/2023 Electronic Signature(s) Signed: 03/18/2023 12:25:41 PM By: Duanne Guess MD FACS Entered By: Duanne Guess on 03/18/2023 09:25:41 -------------------------------------------------------------------------------- Progress Note Details Patient Name: Date of Service: [name] 03/18/2023 11:00 A M Medical Record Number: [id number] Patient Account Number: [id number] Date of Birth/Sex: Treating RN: [birth year] (72 y.o. M) Primary Care Provider: [name] Other Clinician: Referring Provider: Treating Provider/Extender: [name] in Treatment: [address] Subjective Chief Complaint NYSHAUN, JAQUET ([id number])  [id number] Page 10 of 18 Information obtained from Patient Patient seen for complaints of Non-Healing Wounds. History of Present Illness (HPI) ADMISSION 02/09/2022 This is a 72 year old man who recently moved to [place] from [place]. He has a history of of coronary artery disease and prior left popliteal vein DVT . He is not diabetic and does not smoke. 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 He has had multiple spinal surgeries and has limited mobility. He primarily uses a motorized wheelchair. He has had lymphedema for many years and does have lymphedema pumps although he says he does not use  them frequently. He has wounds on his bilateral lower extremities. He was receiving care in [place] for these prior to his move. They were apparently packing his wounds with Betadine soaked gauze. He has worn compression wraps in the past but not recently. He did say that they helped tremendously. In clinic today, his right ABI is noncompressible but has good signals; the left ABI was 1.17. No formal vascular studies have been performed. On his left dorsal foot, there is a circular lesion that exposes the fat layer. There is fairly heavy slough accumulation within the wound, but no significant odor. On his right medial calf, he has multiple pinholes that have slough buildup in them and probe for about 2 to 4 mm in depth. They are draining clear fluid. On his right lateral midfoot, he has ulceration consistent with venous stasis. It is geographic and has slough accumulation. 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 02/17/2022: All of the wounds are little bit larger today and have more slough accumulation. Today, the medial calf openings are larger and probe to something that feels calcified. There is odor coming from his wounds. His vascular studies are scheduled for  August 9 and 10. 02-24-2022 upon evaluation today patient presents for follow-up concerning his ongoing issues here with his lower extremities. With that being said he does have significant problems with what appears to be cellulitis unfortunately the PCR culture that was sent last week got crushed by UPS in transit. With that being said we are going to reobtain a culture today although I am actually to do it on the right medial leg as this seems to be the most inflamed area today where there is some questionable drainage as well. I am going to remove some of the calcium deposits which are loosening obtain a deeper culture from this area. Fortunately I do not see any evidence of active infection systemically which is good news but locally I think we are having a bigger issue here. He does have his appointment next Thursday with vascular. Patient has also been referred to [facility] for further evaluation and treatment of the calcinosis for possible sulfate injections. 03/04/2022: The culture that was performed last week grew out multiple species including Klebsiella, Morganella, and Pseudomonas aeruginosa. He had been prescribed Bactrim but this was not adequate to cover all of the species and levofloxacin was recommended. He completed the Bactrim but did not pick up the levofloxacin and begin taking it until yesterday. We referred him to dermatology at 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 He had arterial studies performed today. He does have evidence of peripheral arterial disease. Results copied here: +-------+-----------+-----------+------------+------------+ ABI/TBIT oday's ABIT oday's TBIPrevious ABIPrevious TBI +-------+-----------+-----------+------------+------------+ Right [place] 0.75     +-------+-----------+-----------+------------+------------+ Left [place] 0.57    +-------+-----------+-----------+------------+------------+ Arterial wall calcification precludes accurate ankle pressures and ABIs. Summary: Right: Resting right ankle-brachial index indicates noncompressible right lower extremity arteries. The right toe-brachial index is normal. Left: Resting left ankle-brachial index indicates noncompressible left lower extremity arteries. The left toe-brachial index is abnormal. He continues to have further tissue breakdown at the right medial calf and there is ample slough accumulation along with necrotic subcutaneous tissue. The left dorsal foot wound has built up slough, as well. The right medial foot wound is nearly closed with just a light layer  of eschar over the surface. The right dorsal lateral foot wound has also accumulated a fair amount of slough and remains quite tender. 03/10/2022: He has been taking the prescribed levofloxacin and has about 3 days left of this. The erythema and induration on his right leg has improved. He continues to experience further breakdown of the right medial calf wound. There is extensive slough and debris accumulation there. There is heavy slough on his left dorsal foot wound, but no odor or purulent drainage. The right medial foot wound appears to be closed. The right dorsal lateral foot wound also has a fair amount of slough but it is less tender than at our last visit. He still does not have an appointment with dermatology. 03/22/2022: The right anterior tibial wound has closed. The right lateral foot wound is nearly closed with just a little bit of slough. The left dorsal foot wound is smaller and continues to have some slough buildup but less so than at prior visits. There is good granulation tissue underlying the slough. Unfortunately the right medial calf wound continues to deteriorate. It has a foul odor today and a lot of necrotic  tissue, the surrounding skin is also erythematous and moist. 03/30/2022: The right lateral foot wound continues to contract and just has a little bit of slough and eschar present. The left dorsal foot wound is also smaller with slough overlying good granulation tissue. The right medial calf wound actually looks quite a bit better today; there is no odor and there is much less necrotic tissue although some remains present. We have been using topical gentamicin and he has been taking oral Augmentin. 04/07/2022: The patient saw dermatology at [facility] last week. Unfortunately, it appears that they did not read any of the documentation that was provided. 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 Overall, it was a highly unsatisfactory consultation. In the meantime, however, we were able to identify compounding pharmacy who could create a topical sodium thiosulfate ointment. The patient has that with him today. Both of the right foot wounds are now closed. The left dorsal foot wound has contracted but still has a layer of slough on the surface. The medial calf wounds on the right all look a little bit better. They still have heavy slough along with the chunks of calcium. Everything is stained purple. 04/14/2022: The wound on his dorsal left foot is a little bit smaller again today. There is still slough accumulation on the surface. The medial calf wounds have quite a bit less slough accumulation today. His right leg, however, is warm and erythematous, concerning for cellulitis. 04/14/2022: He completed his course of Augmentin yesterday. The medial calf wound sites are much cleaner today and shallower. There is less fragmented calcium in the  wounds. He has a lot of lymphedema fluid-like drainage from these wounds, but no purulent discharge or malodor. The wound on his dorsal left foot has also contracted and is shallower. There is a layer of slough over healthy granulation tissue. 05/05/2022: The left dorsal foot wound is smaller today with less slough and more granulation tissue. The right medial calf wounds are shallower, but have extensive slough and some fat necrosis. The drainage has diminished considerably, however. 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 LATANYA, LAVENTURE ([id number]) [id number] Page 11 of 18 05/12/2022: The left dorsal foot wound continues to contract and fill with granulation tissue. There is slough on the wound surface. The right medial calf wounds also continue to fill with healthy tissue, but there is remaining slough and fat necrosis present. He had a significant increase in his drainage this week and it was a bright yellow-green color. He also had a new wound open over his right anterior tibial surface associated with the spike of cutaneous calcium. The leg is more red, as well, concerning for infection. 05/19/2022: His culture returned positive for Pseudomonas. He is currently taking a course of oral levofloxacin. We have also been using topical gentamicin mixed in with his sodium thiosulfate. All of the wounds look better this week. 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 There is slough and nonviable subcutaneous tissue still present. The new anterior tibial wound just has a light layer of slough. The dorsal foot wound also has some slough present and appears a little dry today. 05/26/2022: The right medial leg wound continues to improve. It is feeling with granulation tissue, but still accumulates a fairly thick layer of slough  on the surface. The more recent anterior tibial wound has remained quite small, but also has some slough on the surface. The left dorsal foot wound has contracted somewhat and has perimeter epithelialization. He completed his oral levofloxacin. 06/02/2022: The left dorsal foot wound continues to improve significantly. There is just a little slough on the wound surface. The right medial leg wound had black drainage, but it looks like silver alginate was applied last week and I theorized that silver sulfide was created with a combination of the silver alginate and the sodium thiosulfate. It did, however, have a bit of an odor to it and extensive slough accumulation. The small anterior tibial wound also had a bit of slough and nonviable subcutaneous tissue present. The skin of his leg was rather macerated, as well. 06/09/2022: The left dorsal foot wound is smaller again this week with just a light layer of slough on the surface. The right medial leg wound looks substantially better. The leg and periwound skin are no longer red and macerated. There is good granulation tissue forming with less slough and nonviable tissue. The anterior tibial wound just has a small amount of slough accumulation. 06/16/2022: The left dorsal foot wound continues to contract. His wrap slipped a little bit, however, and his foot is more swollen. The right medial leg wound continues to improve. The underlying tissue is more vital-appearing and there is less slough. He does have substantial drainage. The small anterior tibial wound has a bit of slough accumulation. 06/23/2022: The left dorsal foot wound is about half the size as it was last week. There is just some light slough on the surface and some eschar buildup around the edges. The right anterior leg wound is small with a little slough on the surface. Unfortunately, the right medial leg wound deteriorated fairly substantially. It is malodorous with gray/green/black  discoloration. 06/30/2022: The left dorsal foot wound continues to contract. It is nearly closed with just a light layer of slough present. The right anterior leg wound is about the same size and has a small amount of slough on the surface. The right medial leg wound looks a lot better than it did last  week. The odor has abated and the tissue is less pulpy and necrotic-looking. There is still a fairly thick layer of slough present. 12/13; the left dorsal foot wound looks very healthy. There was no need to change the dressing here and no debridement. He has a small area on the right anterior lower leg apparently had not changed much in size. The area on the medial leg is the most problematic area this is large with some depth and a completely nonviable surface today. 07/14/2022: The left dorsal foot wound is smaller today with just a little slough and eschar present. The right anterior lower leg wound is about the same without any significant change. The medial leg wound has a black and green surface and is malodorous. No purulent drainage and the periwound skin is intact. Edema control is suboptimal. 07/21/2022: The dorsal foot wound continues to contract and is nearly closed with just a little bit of slough and eschar on the surface. The right anterior lower leg wound is shallower today but otherwise the dimensions are unchanged. The medial leg wound looks significantly better although it still has a nonviable surface; the odor and black/green surface has resolved. His culture came back with multiple species sensitive to Augmentin, which was prescribed and a very low level of Pseudomonas, which should be handled by the topical gentamicin we have been using. 07/28/2022: The dorsal foot wound is down to just a very tiny opening with a little eschar on the surface. The right anterior tibial wound is about the same with some slough buildup. The right medial leg wound looks quite a bit better this week.  There is thick rubbery slough on the surface, but the odor and drainage has improved significantly. 08/04/2022: The dorsal foot wound is almost healed, but the periwound skin has broken down. This appears to be secondary to moisture and adhesive from the absorbent pad. The right anterior tibial wound is shallower. The right medial leg wound continues to improve. It is smaller, still with rubbery slough on the surface. No malodor or purulent drainage. 08/11/2022: The dorsal foot wound is about the same size. It is a little bit dry with some slough accumulation. The right anterior tibial wound is also unchanged. The right medial leg wound is filling in further with good granulation tissue. Still with some slough buildup on the surface. 08/18/2022: His left leg is bright red, hot, and swollen with cellulitis. The dorsal foot wound actually looks quite good and is superficial with just a little bit of slough accumulation. The right anterior tibial wound is unchanged. The right medial leg wound has filled in with good granulation tissue but has copious amounts of drainage. 08/25/2022: His cellulitis has responded nicely to the oral antibiotics. His dorsal foot wound is smaller but has a fair amount of slough buildup. The right anterior tibial wound remains stable and unchanged. The right medial leg wound has more granulation tissue under a layer of slough, but continues to have copious drainage. The drainage is actually causing skin irritation and threatens to result in breakdown. 09/01/2022: He continues to have profuse drainage from his wounds which is resulting in tissue maceration on both the dorsum of his left foot and the periwound distal to his medial leg wound. The dorsal foot wound is nearly healed, despite this. The medial right leg wound is filling in with good granulation tissue. We are working on getting home health assistance to change his dressings more frequently to try and control the drainage  better. 09/08/2022: We have had success in controlling his drainage. The tissue maceration has improved significantly and his swelling has come down quite markedly. The medial right leg wound is much more superficial and has substantially less nonviable tissue present. The anterior tibial wound is a little bit larger, however with some nonviable tissue present. 09/15/2022: The dorsal foot wound is nearly closed. The anterior tibial wound measures slightly larger today. The medial right leg wound continues to improve quite dramatically with less nonviable tissue and more robust-looking granulation. Edema control is significantly better. 09/22/2022: The left dorsal foot wound remains open, albeit quite tiny. The anterior tibial wound is a little bit larger again today. The medial right leg wound continues to improve with an increase in robust and healthy granulation tissue and less accumulation of nonviable tissue. 09/29/2022: His home health providers wrapped his left leg extremely tightly and he ended up having to cut the wrap off of his foot. This resulted in his foot being very swollen and that the dorsal foot wound is a little bit larger today. The anterior tibial wound is about the same size with a little slough on the surface. The medial right leg wound continues to fill in. There are very few areas with any significant depth. There is still slough accumulation. 3/13; Patient has a left dorsal foot wound along with a right anterior tibial wound and right medial wound. We are using silver alginate to the left dorsal foot wound, Santyl and sodium thiosulfate compound ointment to the right lower extremity wounds under 4 layer compression. Patient has no issues or complaints today. 10/13/2022: The left dorsal foot wound is a little bit bigger today, but his foot is also a little bit more swollen. The right medial leg wound has filled in more and has substantially less depth. There is still some nonviable  tissue present. The small anterior lower leg wound looks about the same. [name], [name] ([id number]) [id number] Page 12 of 18 10/20/2022: The left dorsal foot wound is epithelializing. There does not appear to be much open at this site. There is some eschar around the edges. The right medial leg wound continues to fill in with granulation tissue. The small anterior lower leg wound is a little bit bigger and there is a chunk of calcium protruding. He continues to have fairly copious drainage from the right leg. 10/27/2022: His home health agency failed to show up at all this past week and as a result, the excessive drainage from his right leg wound has resulted in tissue breakdown. His leg is red and irritated. The wounds themselves look about the same. The wound on his left dorsal foot is nearly closed underneath some eschar. 11/03/2022: We continue to have difficulty with his home health agency. He continues to have significant drainage from his right leg wound which has resulted in even further tissue breakdown. He also was not taking his Lasix as prescribed and I think much of the drainage has been his lymphedema fluid choosing the path of least resistance. The wound on his left dorsal foot is closed, but in the process of cleaning the site, dry skin was pulled off and he has a new small superficial wound just adjacent to where the dorsal foot wound was. 11/12/2022: His left dorsal foot is completely healed. The right medial leg continues to deteriorate. He is not taking his Lasix as prescribed, nor is he using his lymphedema pumps at all. As a result, he has massive amounts of drainage saturating  his dressings and causing further tissue breakdown on his medial leg. Today, his dressings had a foul odor, as well. 11/19/2022: His right medial leg looks even worse today. There is more erythema and moisture related tissue breakdown. The 2 anterior wounds look a little bit better.  The culture that I took last week did produce Pseudomonas and he is currently taking levofloxacin for this. 11/26/2022: There has been significant improvement to his right medial leg. It is far less raw and many of the superficial open areas have epithelialized. He continues to cut the foot portion of his wrap and as result his foot is bulbous and swollen compared to the rest of his leg, where edema control is good. The smaller of the 2 anterior tibial wound is closed, while the larger is contracting somewhat with a bit of slough at the base. 12/03/2022: His leg continues to demonstrate improvement. There has been epithelialization of much of the denuded area. He has a new small anterior tibial wound that has opened around a nidus of calcium. Edema control is improved. 12/10/2022: Continued improvement in the periwound skin. The anterior tibial wounds are about the same, but the medial leg wound has improved further. There is less nonviable tissue present. 12/17/2022: Between his visit on Tuesday with the nurse and today, he has had significantly more drainage which has resulted in some periwound tissue maceration. I suspect this correlates somewhat with his diuretic use, as he says he takes his diuretic religiously on Saturday, Sunday, and Monday and then does not take it after Tuesday due to being out and about and not being close to easy restroom access. I suspect the excess fluid is just simply draining out of his leg. 12/21/2022: This was supposed to be a nurse visit, but his leg has broken down so significantly, that we have converted to a wound care visit. He did take his diuretics over the weekend, but despite this, he has had copious drainage and all of his tissues have broken down substantially from the right medial leg ulcer all the way down to his ankle and even wrapping posterior to this. There is blue-green staining on his dressing, suggesting ongoing presence of  Pseudomonas aeruginosa. 12/24/2022: The wound already looks better than it did 2 days ago. He has been taking ciprofloxacin and the Urgo clean dressing we applied did a much better job of wicking the drainage away from his leg. There is slough on the entire surface of the medial leg wound, but the erythema and induration has improved in the past couple of days. 12/31/2022: Continued improvement of the wound. He says that the pain has basically abated since being on ciprofloxacin. Still with fairly heavy drainage and slough accumulation. 01/07/2023: Overall continued improvement. Still with heavy slough buildup, but less drainage. 01/14/2023: There is a little bit of improvement. The skin is still looking a bit excoriated. He has a few more days left of ciprofloxacin. 01/21/2023: There has been significant improvement. The periwound skin is in much better condition. The ulcers are shallower and actually have some good granulation tissue present under some slough. 01/28/2023: The wound continues to improve. All of the open areas measured smaller today. There is slough accumulation on the surfaces. His drainage has decreased markedly. 02/04/2023: Continued improvement in the wound. Drainage remains significantly diminished. Slough accumulation is present but to a lesser extent. Periwound skin continues to improve. Edema control is good. 02/18/2023: The heavy drainage persists, although it is was not as significant today as  it was at his nurse visit on Tuesday. The drainage is yellow and does not have the blue-green discoloration suggestive of Pseudomonas. The actual wounds on his leg are getting smaller, but the periwound skin is quite excoriated. 02/25/2023: His periwound skin is quite excoriated and he has a lot of clear yellow drainage. The main wounds are filling in further and epithelializing. 03/04/2023: The excoriation of the periwound skin has improved. Most of that area has dried up. The main wounds have  less slough in them today. 03/11/2023: The main wounds have accumulated a little bit of slough. The periwound excoriation continues to improve. Unfortunately, he suffered a fall over the previous weekend and has abrasions on his knees bilaterally and on his right wrist and upper arm. In addition, the skin on his left lower leg apparently blistered and opened, leaving several superficial wounds on this extremity. He has been approved for the [facility] wound VAC and has the equipment with him today. 03/18/2023: He has extensive excoriation to the periwound skin on the right medial lower leg. The actual wounds themselves are smaller with slough accumulation. The top of his left foot has begun weeping again. Using the [facility] wound VAC, he says that he through 4 canisters. He seems to be very fluid overloaded, without any significant dyspnea. Patient History Information obtained from Patient, Chart. Family History Cancer - Mother, Heart Disease - Father,Mother, Hypertension - Father,Siblings, Lung Disease - Siblings, No family history of Diabetes, Hereditary Spherocytosis, Kidney Disease, Seizures, Stroke, Thyroid Problems, Tuberculosis. Social History Former smoker - quit 1991, Marital Status - Married, Alcohol Use - Moderate, Drug Use - No History, Caffeine Use - Daily - coffee. Medical History Eyes Patient has history of Cataracts - right eye removed Denies history of Glaucoma, Optic Neuritis [name], [name] ([id number]) [id number] Page 13 of 18 Ear/Nose/Mouth/Throat Denies history of Chronic sinus problems/congestion, Middle ear problems Hematologic/Lymphatic Patient has history of Anemia - macrocytic, Lymphedema Cardiovascular Patient has history of Congestive Heart Failure, Deep Vein Thrombosis - left leg, Hypertension, Peripheral Venous Disease Endocrine Denies history of Type I Diabetes, Type II Diabetes Genitourinary Denies history of End Stage  Renal Disease Integumentary (Skin) Denies history of History of Burn Oncologic Denies history of Received Chemotherapy, Received Radiation Psychiatric Denies history of Anorexia/bulimia, Confinement Anxiety Hospitalization/Surgery History - cervical fusion. - lumbar surgery. - coronary stent placement. - lasik eye surgery. - hydrocele excision. Medical A Surgical History Notes nd Constitutional Symptoms (General Health) obesity Ear/Nose/Mouth/Throat hard of hearing Respiratory frozen diaphragm right Cardiovascular hyperlipidemia Musculoskeletal myositis, lumbar DDD, spinal stenosis, cervical facet joint syndrome Objective Constitutional Hypertensive, asymptomatic. no acute distress. Vitals Time Taken: 11:06 AM, Height: 71 in, Weight: 267 lbs, BMI: 37.2, Temperature: 98.0 F, Pulse: 72 bpm, Respiratory Rate: 18 breaths/min, Blood Pressure: 156/76 mmHg. Respiratory Normal work of breathing on room air. General Notes: 03/18/2023: He has extensive excoriation to the periwound skin on the right medial lower leg. The actual wounds themselves are smaller with slough accumulation. The top of his left foot has begun weeping again. Integumentary (Hair, Skin) Wound #3 status is Open. Original cause of wound was Gradually Appeared. The date acquired was: 12/07/2021. The wound has been in treatment 57 weeks. The wound is located on the Right,Medial Lower Leg. The wound measures 10cm length x 8cm width x 0.2cm depth; 62.832cm^2 area and 12.566cm^3 volume. There is Fat Layer (Subcutaneous Tissue) exposed. There is no tunneling or undermining noted. There is a large amount of serous drainage noted. The  wound margin is indistinct and nonvisible. There is medium (34-66%) red, pink granulation within the wound bed. There is a medium (34-66%) amount of necrotic tissue within the wound bed including Adherent Slough. The periwound skin appearance had no abnormalities noted for moisture. The periwound skin  appearance exhibited: Excoriation, Hemosiderin Staining, Erythema. The surrounding wound skin color is noted with erythema. Periwound temperature was noted as No Abnormality. Wound #6 status is Open. Original cause of wound was Gradually Appeared. The date acquired was: 05/12/2022. The wound has been in treatment 44 weeks. The wound is located on the Right,Anterior Lower Leg. The wound measures 0.8cm length x 1cm width x 0.2cm depth; 0.628cm^2 area and 0.126cm^3 volume. There is Fat Layer (Subcutaneous Tissue) exposed. There is no tunneling or undermining noted. There is a medium amount of serous drainage noted. The wound margin is distinct with the outline attached to the wound base. There is small (1-33%) pink granulation within the wound bed. There is a large (67-100%) amount of necrotic tissue within the wound bed including Adherent Slough. The periwound skin appearance had no abnormalities noted for texture. The periwound skin appearance had no abnormalities noted for moisture. The periwound skin appearance exhibited: Erythema. The periwound skin appearance did not exhibit: Rubor. The surrounding wound skin color is noted with erythema. Periwound temperature was noted as No Abnormality. Wound #7 status is Open. Original cause of wound was Blister. The date acquired was: 03/09/2023. The wound has been in treatment 1 weeks. The wound is located on the Left,Anterior Lower Leg. The wound measures 1.5cm length x 2cm width x 0.1cm depth; 2.356cm^2 area and 0.236cm^3 volume. There is Fat Layer (Subcutaneous Tissue) exposed. There is no tunneling or undermining noted. There is a medium amount of serous drainage noted. The wound margin is distinct with the outline attached to the wound base. There is large (67-100%) red granulation within the wound bed. There is a small (1-33%) amount of necrotic tissue within the wound bed including Adherent Slough. The periwound skin appearance had no abnormalities noted  for texture. The periwound skin appearance had no abnormalities noted for moisture. The periwound skin appearance had no abnormalities noted for color. Periwound temperature was noted as No Abnormality. Wound #8 status is Open. Original cause of wound was Gradually Appeared. The date acquired was: 03/18/2023. The wound is located on the Left,Dorsal Foot. The wound measures 0.5cm length x 0.5cm width x 0.1cm depth; 0.196cm^2 area and 0.02cm^3 volume. There is Fat Layer (Subcutaneous Tissue) exposed. There is no tunneling or undermining noted. There is a medium amount of serous drainage noted. The wound margin is flat and intact. There is no granulation within the wound bed. There is a large (67-100%) amount of necrotic tissue within the wound bed including Adherent Slough. The periwound skin appearance had no abnormalities noted for texture. The periwound skin appearance had no abnormalities noted for moisture. The periwound skin appearance had no abnormalities noted for color. Periwound temperature was noted as No Abnormality. 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 There was a Selective/Open Wound Non-Viable Tissue Debridement with a total area of 50.24 sq cm performed by Duanne Guess, MD. With  the following instrument(s): Curette to remove Non-Viable tissue/material. Material removed includes [facility] after achieving pain control using Lidocaine 4% Topical Solution. No specimens were taken. A time out was conducted at 11:45, prior to the start of the procedure. A Minimum amount of bleeding was controlled with Pressure. The procedure was tolerated well with a pain level of 0 throughout and a pain level of 0 following the procedure. Post Debridement Measurements: 10cm length x 8cm width x 0.2cm depth; 12.566cm^3 volume. Character of Wound/Ulcer Post Debridement is improved. Post procedure Diagnosis Wound #3: Same as Pre-Procedure Pre-procedure diagnosis of Wound #3 is a Lymphedema located on the Right,Medial Lower Leg . There was a Three Layer Compression Therapy Procedure by Zenaida Deed, RN. Post procedure Diagnosis Wound #3: Same as Pre-Procedure Wound #7 Pre-procedure diagnosis of Wound #7 is a Lymphedema located on the Left,Anterior Lower Leg . There was a Selective/Open Wound Skin/Epidermis Debridement with a total area of 2.36 sq cm performed by Duanne Guess, MD. With the following instrument(s): Curette to remove Non-Viable tissue/material. Material removed includes [facility] and Skin: Epidermis and after achieving pain control using Lidocaine 4% T opical Solution. No specimens were taken. A time out was conducted at 11:45, prior to the start of the procedure. A Minimum amount of bleeding was controlled with Pressure. The procedure was tolerated well with a pain level of 0 throughout and a pain level of 0 following the procedure. Post Debridement Measurements: 2cm length x 1.5cm width x 0.1cm depth; 0.236cm^3 volume. Character of Wound/Ulcer Post Debridement is improved. Post procedure Diagnosis Wound #7: Same as Pre-Procedure Pre-procedure diagnosis of Wound #7 is a Lymphedema located on the Left,Anterior Lower Leg . There was a Three Layer Compression Therapy Procedure  by Zenaida Deed, RN. Post procedure Diagnosis Wound #7: Same as Pre-Procedure Wound #8 Pre-procedure diagnosis of Wound #8 is a Lymphedema located on the Left,Dorsal Foot . There was a Selective/Open Wound Non-Viable Tissue Debridement with a total area of 0.2 sq cm performed by Duanne Guess, MD. With the following instrument(s): Curette to remove Non-Viable tissue/material. Material removed includes [facility] after achieving pain control using Lidocaine 4% Topical Solution. No specimens were taken. A time out was conducted at 11:45, prior to the start of the procedure. A Minimum amount of bleeding was controlled with Pressure. The procedure was tolerated well with a pain level of 0 throughout and a pain level of 0 following the procedure. Post Debridement Measurements: 0.5cm length x 0.5cm width x 0.1cm depth; 0.02cm^3 volume. Character of Wound/Ulcer Post Debridement is improved. Post procedure Diagnosis Wound #8: Same as Pre-Procedure Plan Follow-up Appointments: Return Appointment in 1 week. - Dr. [name] RM 1 Friday 8/30 @ 11:00 am Nurse Visit: - Tues. 8/27 @ 11:15 am Anesthetic: (In clinic) Topical Lidocaine 4% applied to wound bed Bathing/ Shower/ Hygiene: May shower with protection but do not get wound dressing(s) wet. Protect dressing(s) with water repellant cover (for example, large plastic bag) or a cast cover and may then take shower. Negative Presssure Wound Therapy: Wound Vac to wound continuously at 1106mm/hg pressure Black Foam Edema Control - Lymphedema / SCD / Other: Lymphedema Pumps. Use Lymphedema pumps on leg(s) 2-3 times a day for 45-60 minutes. If wearing any wraps or hose, do not remove them. Continue exercising  as instructed. Elevate legs to the level of the heart or above for 30 minutes daily and/or when sitting for 3-4 times a day throughout the day. - throughout the day Avoid standing for long periods of time. Compression stocking or Garment 20-30 mm/Hg  pressure to: - juxtalite left leg daily [name], [name] ([id number]) [id number] Page 15 of 18 Other Edema Control Orders/Instructions: - kerlix/coban wrap to left leg while wife recovers from surgery WOUND #3: - Lower Leg Wound Laterality: Right, Medial Peri-Wound Care: Skin Prep 1 x Per Week/30 Days Discharge Instructions: cavelon to macerated wound margins Peri-Wound Care: Sween Lotion (Moisturizing lotion) 1 x Per Week/30 Days Discharge Instructions: Apply moisturizing lotion as directed Prim Dressing: peel and place vac 1 x Per Week/30 Days ary Com pression Wrap: ThreePress (3 layer compression wrap) 1 x Per Week/30 Days Discharge Instructions: Apply three layer compression as directed. WOUND #6: - Lower Leg Wound Laterality: Right, Anterior Peri-Wound Care: Sween Lotion (Moisturizing lotion) 1 x Per Week/30 Days Discharge Instructions: Apply moisturizing lotion as directed Prim Dressing: peel and place vac 1 x Per Week/30 Days ary Com pression Wrap: ThreePress (3 layer compression wrap) 1 x Per Week/30 Days Discharge Instructions: Apply three layer compression as directed. WOUND #7: - Lower Leg Wound Laterality: Left, Anterior Cleanser: Soap and Water 1 x Per Week/30 Days Discharge Instructions: May shower and wash wound with dial antibacterial soap and water prior to dressing change. Peri-Wound Care: Sween Lotion (Moisturizing lotion) 1 x Per Week/30 Days Discharge Instructions: Apply moisturizing lotion as directed Prim Dressing: Maxorb Extra Ag+ Alginate Dressing, 2x2 (in/in) 1 x Per Week/30 Days ary Discharge Instructions: Apply to wound bed as instructed Secondary Dressing: ABD Pad, 8x10 1 x Per Week/30 Days Discharge Instructions: Apply over primary dressing as directed. Com pression Wrap: ThreePress (3 layer compression wrap) 1 x Per Week/30 Days Discharge Instructions: Apply three layer compression as directed. WOUND #8: - Foot Wound Laterality:  Dorsal, Left Cleanser: Soap and Water 1 x Per Week/30 Days Discharge Instructions: May shower and wash wound with dial antibacterial soap and water prior to dressing change. Peri-Wound Care: Sween Lotion (Moisturizing lotion) 1 x Per Week/30 Days Discharge Instructions: Apply moisturizing lotion as directed Prim Dressing: Maxorb Extra Ag+ Alginate Dressing, 2x2 (in/in) 1 x Per Week/30 Days ary Discharge Instructions: Apply to wound bed as instructed Secondary Dressing: ABD Pad, 8x10 1 x Per Week/30 Days Discharge Instructions: Apply over primary dressing as directed. Com pression Wrap: ThreePress (3 layer compression wrap) 1 x Per Week/30 Days Discharge Instructions: Apply three layer compression as directed. 03/18/2023: He has extensive excoriation to the periwound skin on the right medial lower leg. The actual wounds themselves are smaller with slough accumulation. The top of his left foot has begun weeping again. Using the [facility] and Press wound VAC, he says that he through 4 canisters. He seems to be very fluid overloaded, without any significant dyspnea. I used a curette to debride slough from the wounds on his right medial lower leg. I also debrided slough from the top of his left foot and slough and skin from the left anterior tibial wound. We discussed some of the problems that we noted with the new wound VAC with the representative from [facility]. Some suggestions were offered which we will try. This includes applying silver alginate to the periwound and increasing suction from 125 to 150 mmHg. We will continue bilateral 3 layer compression wraps. Continue silver alginate to the other open wounds. We will bring him  back to clinic on Tuesday next week, to ascertain whether or not our interventions are helping. Electronic Signature(s) Signed: 03/18/2023 12:33:50 PM By: Duanne Guess MD FACS Entered By: Duanne Guess on 03/18/2023  09:33:50 -------------------------------------------------------------------------------- HxROS Details Patient Name: Date of Service: [name] 03/18/2023 11:00 A M Medical Record Number: [id number] Patient Account Number: [id number] Date of Birth/Sex: Treating RN: Feb 09, 1951 (72 y.o. M) Primary Care Provider: [name] Other Clinician: Referring Provider: Treating Provider/Extender: [name] in Treatment: 76 Information Obtained From Patient Chart Constitutional Symptoms (General Health) Medical History: Past Medical History Notes: obesity [name], [name] ([id number]) [id number] Page 16 of 18 Eyes Medical History: Positive for: Cataracts - right eye removed Negative for: Glaucoma; Optic Neuritis Ear/Nose/Mouth/Throat Medical History: Negative for: Chronic sinus problems/congestion; Middle ear problems Past Medical History Notes: hard of hearing Hematologic/Lymphatic Medical History: Positive for: Anemia - macrocytic; Lymphedema Respiratory Medical History: Past Medical History Notes: frozen diaphragm right Cardiovascular Medical History: Positive for: Congestive Heart Failure; Deep Vein Thrombosis - left leg; Hypertension; Peripheral Venous Disease Past Medical History Notes: hyperlipidemia Endocrine Medical History: Negative for: Type I Diabetes; Type II Diabetes Genitourinary Medical History: Negative for: End Stage Renal Disease Integumentary (Skin) Medical History: Negative for: History of Burn Musculoskeletal Medical History: Past Medical History Notes: myositis, lumbar DDD, spinal stenosis, cervical facet joint syndrome Oncologic Medical History: Negative for: Received Chemotherapy; Received Radiation Psychiatric Medical History: Negative for: Anorexia/bulimia; Confinement Anxiety HBO Extended History Items Eyes: Cataracts Immunizations Pneumococcal Vaccine: Received Pneumococcal  Vaccination: Yes Received Pneumococcal Vaccination On or After 60th Birthday: Yes Implantable Devices No devices added Hospitalization / Surgery History Type of Hospitalization/Surgery cervical fusion [name], [name] ([id number]) [id number] Page 17 of 18 lumbar surgery coronary stent placement lasik eye surgery hydrocele excision Family and Social History Cancer: Yes - Mother; Diabetes: No; Heart Disease: Yes - Father,Mother; Hereditary Spherocytosis: No; Hypertension: Yes - Father,Siblings; Kidney Disease: No; Lung Disease: Yes - Siblings; Seizures: No; Stroke: No; Thyroid Problems: No; Tuberculosis: No; Former smoker - quit 1991; Marital Status - Married; Alcohol Use: Moderate; Drug Use: No History; Caffeine Use: Daily - coffee; Financial Concerns: No; Food, Clothing or Shelter Needs: No; Support System Lacking: No; Transportation Concerns: No Electronic Signature(s) Signed: 03/18/2023 12:35:56 PM By: Duanne Guess MD FACS Entered By: Duanne Guess on 03/18/2023 09:29:13 -------------------------------------------------------------------------------- SuperBill Details Patient Name: Date of Service: JAXZON, KIESS 03/18/2023 Medical Record Number: [id number] Patient Account Number: [id number] Date of Birth/Sex: Treating RN: 08-04-1950 (72 y.o. 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)  (peripheral) I89.0 Lymphedema, not elsewhere classified I10 Essential (primary) hypertension I25.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 angina pectoris Facility Procedures : CPT4 Code: [id number] Description: 97597 - DEBRIDE WOUND 1ST 20 SQ CM OR < ICD-10 Diagnosis Description 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 L97.821 Non-pressure chronic ulcer of other part of left lower leg limited to  breakdown o L97.521 Non-pressure chronic ulcer of other part of left foot limited to breakdown of ski  Modifier: d f skin n Quantity: 1 : CPT4 Code: 78295621 Description: 30865 - DEBRIDE WOUND EA ADDL 20 SQ CM ICD-10 Diagnosis Description 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 L97.821 Non-pressure chronic ulcer of other part of left lower leg limited to  breakdown o L97.521 Non-pressure chronic ulcer of other part of left foot limited to breakdown of ski Modifier: d f skin n Quantity: 2 Physician Procedures : CPT4 Code Description Modifier [id number] 99214 - WC PHYS LEVEL 4 - EST PT 25 ICD-10 Diagnosis Description 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ed L97.821 Non-pressure chronic ulcer of other part of left  lower leg limited to breakdown of skin L97.521 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 CREDENCE, GIANNOTTI ([id number]) [id number] 97597 - WC PHYS DEBR WO ANESTH 20  SQ CM 1 ICD-10 Diagnosis Description 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ed L97.821 Non-pressure chronic ulcer of other part of left lower leg limited to breakdown of skin L97.521 Non-pressure chronic  ulcer of other part of left foot limited to breakdown of skin Quantity: 1 7.pdf Page 18 of 18 : [id number] 97598 - WC PHYS DEBR  WO ANESTH EA ADD 20 CM 2 ICD-10 Diagnosis Description 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ed L97.821 Non-pressure chronic ulcer of other part of left lower leg limited to  breakdown of skin L97.521 Non-pressure chronic ulcer of other part of left foot limited to breakdown of skin Quantity: Electronic Signature(s) Signed: 03/18/2023 12:34:35 PM By: Duanne Guess MD FACS Entered By: Duanne Guess on 03/18/2023 09:34:35

## 2023-03-22 ENCOUNTER — Encounter (HOSPITAL_BASED_OUTPATIENT_CLINIC_OR_DEPARTMENT_OTHER): Payer: Medicare Other | Admitting: General Surgery

## 2023-03-22 DIAGNOSIS — L942 Calcinosis cutis: Secondary | ICD-10-CM | POA: Diagnosis not present

## 2023-03-22 DIAGNOSIS — L97821 Non-pressure chronic ulcer of other part of left lower leg limited to breakdown of skin: Secondary | ICD-10-CM | POA: Diagnosis not present

## 2023-03-22 DIAGNOSIS — L98492 Non-pressure chronic ulcer of skin of other sites with fat layer exposed: Secondary | ICD-10-CM | POA: Diagnosis not present

## 2023-03-22 DIAGNOSIS — I872 Venous insufficiency (chronic) (peripheral): Secondary | ICD-10-CM | POA: Diagnosis not present

## 2023-03-22 DIAGNOSIS — L97812 Non-pressure chronic ulcer of other part of right lower leg with fat layer exposed: Secondary | ICD-10-CM | POA: Diagnosis not present

## 2023-03-22 DIAGNOSIS — L97521 Non-pressure chronic ulcer of other part of left foot limited to breakdown of skin: Secondary | ICD-10-CM | POA: Diagnosis not present

## 2023-03-22 NOTE — Progress Notes (Signed)
[name], [name] ([id number]) [id number] Page 1 of 13 Visit Report for 03/18/2023 Arrival Information Details Patient Name: Date of Service: [name], [name] 03/18/2023 11:00 A M Medical Record Number: [id number] Patient Account Number: [id number] Date of Birth/Sex: Treating RN: Feb 26, 1951 (72 y.o. M) Primary Care Filomeno Cromley: [name] Other Clinician: Referring Wallie Lagrand: Treating Kord Monette/Extender: [name] in Treatment: 37 Visit Information History Since Last Visit Added or deleted any medications: No Patient Arrived: Wheel Chair Any new allergies or adverse reactions: No Arrival Time: 11:05 Had a fall or experienced change in No Accompanied By: self activities of daily living that may affect Transfer Assistance: None risk of falls: Patient Identification Verified: Yes Signs or symptoms of abuse/neglect since last visito No Secondary Verification Process Completed: Yes Hospitalized since last visit: No Patient Requires Transmission-Based Precautions: No Implantable device outside of the clinic excluding No Patient Has Alerts: Yes cellular tissue based products placed in the center Patient Alerts: R ABI= N/C, TBI= .75 since last visit: L ABI =N/C, TBI = .57 Has Dressing in Place as Prescribed: Yes Pain Present Now: No Electronic Signature(s) Signed: 03/18/2023 12:34:12 PM By: Zenaida Deed RN, BSN Entered By: Zenaida Deed on 03/18/2023 42:59:56 -------------------------------------------------------------------------------- Compression Therapy Details Patient Name: Date of Service: [name] 03/18/2023 11:00 A M Medical Record Number: [id number] Patient Account Number: [id number] Date of Birth/Sex: Treating RN: 05/27/1951 (72 y.o. [name] Primary Care Harlem Thresher: [name] Other Clinician: Referring Tiziana Cislo: Treating Adeli Frost/Extender: [name] in Treatment:  88 Compression Therapy Performed for Wound Assessment: Wound #3 Right,Medial Lower Leg Performed By: Clinician Zenaida Deed, RN Compression Type: Three Layer Post Procedure Diagnosis Same as Pre-procedure Electronic Signature(s) Signed: 03/18/2023 12:34:12 PM By: Zenaida Deed RN, BSN Entered By: Zenaida Deed on 03/18/2023 08:51:30 [name] ([id number]) [id number] Page 2 of 13 -------------------------------------------------------------------------------- Compression Therapy Details Patient Name: Date of Service: [name], [name] 03/18/2023 11:00 A M Medical Record Number: [id number] Patient Account Number: [id number] Date of Birth/Sex: Treating RN: 1951/05/21 (72 y.o. [name] Primary Care Billye Nydam: [name] Other Clinician: Referring Clayten Allcock: Treating Phillippe Orlick/Extender: [name] in Treatment: 49 Compression Therapy Performed for Wound Assessment: Wound #7 Left,Anterior Lower Leg Performed By: Clinician Zenaida Deed, RN Compression Type: Three Layer Post Procedure Diagnosis Same as Pre-procedure Electronic Signature(s) Signed: 03/18/2023 12:34:12 PM By: Zenaida Deed RN, BSN Entered By: Zenaida Deed on 03/18/2023 08:51:30 -------------------------------------------------------------------------------- Encounter Discharge Information Details Patient Name: Date of Service: [name], [name] 03/18/2023 11:00 A M Medical Record Number: [id number] Patient Account Number: [id number] Date of Birth/Sex: Treating RN: [birth year] (72 y.o. [name] Primary Care Jishnu Jenniges: [name] Other Clinician: Referring Stefana Lodico: Treating Evon Dejarnett/Extender: [name] in Treatment: 29 Encounter Discharge Information Items Post Procedure Vitals Discharge Condition: Stable Temperature (F): 98.3 Ambulatory Status: Wheelchair Pulse (bpm): 72 Discharge Destination:  Home Respiratory Rate (breaths/min): 18 Transportation: Private Auto Blood Pressure (mmHg): 153/76 Accompanied By: self Schedule Follow-up Appointment: Yes Clinical Summary of Care: Patient Declined Electronic Signature(s) Signed: 03/18/2023 12:34:12 PM By: Zenaida Deed RN, BSN Entered By: Zenaida Deed on 03/18/2023 20:25:42 -------------------------------------------------------------------------------- Lower Extremity Assessment Details Patient Name: Date of Service: [name], [name] 03/18/2023 11:00 A M Medical Record Number: [id number] Patient Account Number: [id number] Date of Birth/Sex: Treating RN: 1951-07-20 (72 y.o. [name] Primary Care Eliane Hammersmith: [name] Other Clinician: Referring Oluwatobiloba [name]: Treating Ellason Segar/Extender: [name] in Treatment: 54 Edema Assessment Assessed: Kyra Searles: No] [Right: No] Edema: [Left: Yes] [Right: Yes]  Calf ELLARD, ROSA ([id number]) [id number] Page 3 of 13 Left: Right: Point of Measurement: From Medial Instep 41.5 cm 41 cm Ankle Left: Right: Point of Measurement: From Medial Instep 23 cm 26.5 cm Vascular Assessment Extremity colors, hair growth, and conditions: Extremity Color: [Left:Hyperpigmented] [Right:Hyperpigmented] Hair Growth on Extremity: [Left:Yes] [Right:Yes] Temperature of Extremity: [Left:Warm < 3 seconds] [Right:Warm < 3 seconds] Electronic Signature(s) Signed: 03/18/2023 12:34:12 PM By: Zenaida Deed RN, BSN Entered By: Zenaida Deed on 03/18/2023 08:32:24 -------------------------------------------------------------------------------- Multi Wound Chart Details Patient Name: Date of Service: [name] 03/18/2023 11:00 A M Medical Record Number: [id number] Patient Account Number: [id number] Date of Birth/Sex: Treating RN: 06/06/1951 (72 y.o. M) Primary Care Nickayla Mcinnis: [name] Other Clinician: Referring Nhung Danko: Treating [name]/Extender:  [name] in Treatment: 14 Vital Signs Height(in): 71 Pulse(bpm): 72 Weight(lbs): 267 Blood Pressure(mmHg): 156/76 Body Mass Index(BMI): 37.2 Temperature(F): 98.0 Respiratory Rate(breaths/min): 18 [3:Photos:] Right, Medial Lower Leg Right, Anterior Lower Leg Left, Anterior Lower Leg Wound Location: Gradually Appeared Gradually Appeared Blister Wounding Event: Lymphedema Calcinosis Lymphedema Primary Etiology: Cataracts, Anemia, Lymphedema, Cataracts, Anemia, Lymphedema, Cataracts, Anemia, Lymphedema, Comorbid History: Congestive Heart Failure, Deep Vein Congestive Heart Failure, Deep Vein Congestive Heart Failure, Deep Vein Thrombosis, Hypertension, Peripheral Thrombosis, Hypertension, Peripheral Thrombosis, Hypertension, Peripheral Venous Disease Venous Disease Venous Disease 12/07/2021 05/12/2022 03/09/2023 Date Acquired: 57 44 1 Weeks of Treatment: Open Open Open Wound Status: No No No Wound Recurrence: No Yes No Clustered Wound: N/A 2 N/A Clustered Quantity: 10x8x0.2 0.8x1x0.2 1.5x2x0.1 Measurements L x W x D (cm) 62.832 0.628 2.356 A (cm) : rea 12.566 0.126 0.236 Volume (cm) : -1076.40% -166.10% 33.30% % Reduction in Area: -488.30% -425.00% 33.10% % Reduction in Volume: Full Thickness With Exposed Support Full Thickness Without Exposed Full Thickness Without Exposed Classification: Structures Support Structures Support Structures Large Medium Medium Exudate AmountSHAMMAH, BORNT ([id number]) [id number] Page 4 of 13 Serous Serous Serous Exudate Type: amber amber amber Exudate Color: Indistinct, nonvisible Distinct, outline attached Distinct, outline attached Wound Margin: Medium (34-66%) Small (1-33%) Large (67-100%) Granulation Amount: Red, Pink Pink Red Granulation Quality: Medium (34-66%) Large (67-100%) Small (1-33%) Necrotic Amount: Fat Layer (Subcutaneous Tissue): Yes Fat Layer (Subcutaneous  Tissue): Yes Fat Layer (Subcutaneous Tissue): Yes Exposed Structures: Fascia: No Fascia: No Fascia: No Tendon: No Tendon: No Tendon: No Muscle: No Muscle: No Muscle: No Joint: No Joint: No Joint: No Bone: No Bone: No Bone: No Small (1-33%) Large (67-100%) Medium (34-66%) Epithelialization: Debridement - Selective/Open Wound N/A Debridement - Selective/Open Wound Debridement: Pre-procedure Verification/Time Out 11:45 N/A 11:45 Taken: Lidocaine 4% Topical Solution N/A Lidocaine 4% Topical Solution Pain Control: Slough N/A Slough Tissue Debrided: Non-Viable Tissue N/A Skin/Epidermis Level: 50.24 N/A 2.36 Debridement A (sq cm): rea Curette N/A Curette Instrument: Minimum N/A Minimum Bleeding: Pressure N/A Pressure Hemostasis A chieved: 0 N/A 0 Procedural Pain: 0 N/A 0 Post Procedural Pain: Procedure was tolerated well N/A Procedure was tolerated well Debridement Treatment Response: 10x8x0.2 N/A 2x1.5x0.1 Post Debridement Measurements L x W x D (cm) 12.566 N/A 0.236 Post Debridement Volume: (cm) Excoriation: Yes Excoriation: No No Abnormalities Noted Periwound Skin Texture: No Abnormalities Noted No Abnormalities Noted No Abnormalities Noted Periwound Skin Moisture: Erythema: Yes Erythema: Yes No Abnormalities Noted Periwound Skin Color: Hemosiderin Staining: Yes Rubor: No No Abnormality No Abnormality No Abnormality Temperature: Compression Therapy N/A Compression Therapy Procedures Performed: Debridement Debridement Wound Number: 8 N/A N/A Photos: N/A N/A Left, Dorsal Foot N/A N/A Wound Location: Gradually Appeared N/A N/A Wounding Event: Lymphedema N/A N/A Primary  Etiology: Cataracts, Anemia, Lymphedema, N/A N/A Comorbid History: Congestive Heart Failure, Deep Vein Thrombosis, Hypertension, Peripheral Venous Disease 03/18/2023 N/A N/A Date Acquired: 0 N/A N/A Weeks of Treatment: Open N/A N/A Wound Status: No N/A N/A Wound  Recurrence: No N/A N/A Clustered Wound: N/A N/A N/A Clustered Quantity: 0.5x0.5x0.1 N/A N/A Measurements L x W x D (cm) 0.196 N/A N/A A (cm) : rea 0.02 N/A N/A Volume (cm) : N/A N/A N/A % Reduction in A rea: N/A N/A N/A % Reduction in Volume: Full Thickness Without Exposed N/A N/A Classification: Support Structures Medium N/A N/A Exudate A mount: Serous N/A N/A Exudate Type: amber N/A N/A Exudate Color: Flat and Intact N/A N/A Wound Margin: None Present (0%) N/A N/A Granulation A mount: N/A N/A N/A Granulation Quality: Large (67-100%) N/A N/A Necrotic A mount: Fat Layer (Subcutaneous Tissue): Yes N/A N/A Exposed Structures: Fascia: No Tendon: No Muscle: No Joint: No Bone: No Small (1-33%) N/A N/A Epithelialization: Debridement - Selective/Open Wound N/A N/A Debridement: Pre-procedure Verification/Time Out 11:45 N/A N/A Taken: Lidocaine 4% Topical Solution N/A N/A Pain ControlBAYRO, TORELL ([id number]) [id number] Page 5 of [address] N/A N/A Tissue Debrided: Non-Viable Tissue N/A N/A Level: 0.2 N/A N/A Debridement A (sq cm): rea Curette N/A N/A Instrument: Minimum N/A N/A Bleeding: Pressure N/A N/A Hemostasis A chieved: 0 N/A N/A Procedural Pain: 0 N/A N/A Post Procedural Pain: Procedure was tolerated well N/A N/A Debridement Treatment Response: 0.5x0.5x0.1 N/A N/A Post Debridement Measurements L x W x D (cm) 0.02 N/A N/A Post Debridement Volume: (cm) No Abnormalities Noted N/A N/A Periwound Skin Texture: No Abnormalities Noted N/A N/A Periwound Skin Moisture: No Abnormalities Noted N/A N/A Periwound Skin Color: No Abnormality N/A N/A Temperature: Debridement N/A N/A Procedures Performed: Treatment Notes Electronic Signature(s) Signed: 03/18/2023 12:25:55 PM By: Duanne Guess MD FACS Entered By: Duanne Guess on 03/18/2023  09:25:55 -------------------------------------------------------------------------------- Multi-Disciplinary Care Plan Details Patient Name: Date of Service: [name] 03/18/2023 11:00 A M Medical Record Number: [id number] Patient Account Number: [id number] Date of Birth/Sex: Treating RN: [birth year] (72 y.o. [name] Primary Care Kawehi Hostetter: [name] Other Clinician: Referring Dayja Loveridge: Treating Nazeer Romney/Extender: [name] in Treatment: 56 Multidisciplinary Care Plan reviewed with physician Active Inactive Venous Leg Ulcer Nursing Diagnoses: Knowledge deficit related to disease process and management Potential for venous Insuffiency (use before diagnosis confirmed) Goals: Patient will maintain optimal edema control Date Initiated: 02/17/2022 Target Resolution Date: 03/23/2023 Goal Status: Active Interventions: Assess peripheral edema status every visit. Compression as ordered Provide education on venous insufficiency Treatment Activities: Non-invasive vascular studies : 02/17/2022 Therapeutic compression applied : 02/17/2022 Notes: Wound/Skin Impairment Nursing Diagnoses: Impaired tissue integrity Knowledge deficit related to ulceration/compromised skin integrity Goals: Patient/caregiver will verbalize understanding of skin care regimen REINALD, TORELLO ([id number]) [id number] Page 6 of 13 Date Initiated: 02/17/2022 Target Resolution Date: 03/23/2023 Goal Status: Active Ulcer/skin breakdown will have a volume reduction of 30% by week 4 Date Initiated: 02/17/2022 Date Inactivated: 03/10/2022 Target Resolution Date: 03/10/2022 Unmet Reason: infection being treated, Goal Status: Unmet waiting on derm appte Ulcer/skin breakdown will have a volume reduction of 50% by week 8 Date Initiated: 03/10/2022 Date Inactivated: 04/14/2022 Target Resolution Date: 04/07/2022 Goal Status: Unmet Unmet Reason:  calcinosis Ulcer/skin breakdown will have a volume reduction of 80% by week 12 Date Initiated: 04/14/2022 Date Inactivated: 05/05/2022 Target Resolution Date: 05/05/2022 Unmet Reason: infection, chronic Goal Status: Unmet condition Interventions: Assess patient/caregiver ability to obtain necessary supplies Assess patient/caregiver ability to perform ulcer/skin care regimen upon  admission and as needed Assess ulceration(s) every visit Provide education on ulcer and skin care Treatment Activities: Skin care regimen initiated : 02/17/2022 Topical wound management initiated : 02/17/2022 Notes: Electronic Signature(s) Signed: 03/18/2023 12:34:12 PM By: Zenaida Deed RN, BSN Entered By: Zenaida Deed on 03/18/2023 08:38:49 -------------------------------------------------------------------------------- Pain Assessment Details Patient Name: Date of Service: [name] 03/18/2023 11:00 A M Medical Record Number: [id number] Patient Account Number: [id number] Date of Birth/Sex: Treating RN: 1951/04/12 (72 y.o. M) Primary Care Eian Vandervelden: [name] Other Clinician: Referring Carely Nappier: Treating Hinton Luellen/Extender: [name] in Treatment: 29 Active Problems Location of Pain Severity and Description of Pain Patient Has Paino No Site Locations Rate the pain. Current Pain Level: 0 Pain Management and Medication Current Pain Management: RANY, TENESACA ([id number]) [id number] Page 7 of 13 Electronic Signature(s) Signed: 03/18/2023 12:34:12 PM By: Zenaida Deed RN, BSN Entered By: Zenaida Deed on 03/18/2023 08:23:41 -------------------------------------------------------------------------------- Patient/Caregiver Education Details Patient Name: Date of Service: Eschmann, [name] 8/23/2024andnbsp11:00 A M Medical Record Number: [id number] Patient Account Number: [id number] Date of Birth/Gender: Treating RN: 1950-08-01 (72 y.o. [name] Primary Care Physician: [name] Other Clinician: Referring Physician: Treating Physician/Extender: [name] in Treatment: 62 Education Assessment Education Provided To: Patient Education Topics Provided Venous: Methods: Explain/Verbal Responses: Reinforcements needed, State content correctly Wound/Skin Impairment: Methods: Explain/Verbal Responses: Reinforcements needed, State content correctly Electronic Signature(s) Signed: 03/18/2023 12:34:12 PM By: Zenaida Deed RN, BSN Entered By: Zenaida Deed on 03/18/2023 08:39:23 -------------------------------------------------------------------------------- Wound Assessment Details Patient Name: Date of Service: [name], [name] 03/18/2023 11:00 A M Medical Record Number: [id number] Patient Account Number: [id number] Date of Birth/Sex: Treating RN: 01-23-1951 (72 y.o. M) Primary Care Edgel Degnan: [name] Other Clinician: Referring Tyvon Eggenberger: Treating Sonnet Rizor/Extender: [name] in Treatment: 36 Wound Status Wound Number: 3 Primary Lymphedema Etiology: Wound Location: Right, Medial Lower Leg Wound Open Wounding Event: Gradually Appeared Status: Date Acquired: 12/07/2021 Comorbid Cataracts, Anemia, Lymphedema, Congestive Heart Failure, Deep Weeks Of Treatment: 57 History: Vein Thrombosis, Hypertension, Peripheral Venous Disease Clustered Wound: No Photos [name], [name] ([id number]) [id number] Page 8 of 13 Wound Measurements Length: (cm) 10 Width: (cm) 8 Depth: (cm) 0.2 Area: (cm) 62.832 Volume: (cm) 12.566 % Reduction in Area: -1076.4% % Reduction in Volume: -488.3% Epithelialization: Small (1-33%) Tunneling: No Undermining: No Wound Description Classification: Full Thickness With Exposed Suppo Wound Margin: Indistinct, nonvisible Exudate Amount: Large Exudate Type: Serous Exudate Color: amber rt Structures  Foul Odor After Cleansing: No Slough/Fibrino Yes Wound Bed Granulation Amount: Medium (34-66%) Exposed Structure Granulation Quality: Red, Pink Fascia Exposed: No Necrotic Amount: Medium (34-66%) Fat Layer (Subcutaneous Tissue) Exposed: Yes Necrotic Quality: Adherent Slough Tendon Exposed: No Muscle Exposed: No Joint Exposed: No Bone Exposed: No Periwound Skin Texture Texture Color No Abnormalities Noted: No No Abnormalities Noted: No Excoriation: Yes Erythema: Yes Hemosiderin Staining: Yes Moisture No Abnormalities Noted: Yes Temperature / Pain Temperature: No Abnormality Treatment Notes Wound #3 (Lower Leg) Wound Laterality: Right, Medial Cleanser Peri-Wound Care Skin Prep Discharge Instruction: cavelon to macerated wound margins Sween Lotion (Moisturizing lotion) Discharge Instruction: Apply moisturizing lotion as directed Topical Primary Dressing peel and place vac Secondary Dressing Secured With Compression Wrap ThreePress (3 layer compression wrap) Discharge Instruction: Apply three layer compression as directed. Compression Stockings Add-Ons Electronic Signature(s) Signed: 03/18/2023 12:34:12 PM By: Zenaida Deed RN, BSN [name], [name] ([id number]) [id number] Page 9 of 13 Entered By: Zenaida Deed on 03/18/2023 08:36:18 -------------------------------------------------------------------------------- Wound Assessment Details Patient Name: Date of Service: [place], [place]  03/18/2023 11:00 A M Medical Record Number: [id number] Patient Account Number: [id number] Date of Birth/Sex: Treating RN: 09-21-1950 (72 y.o. M) Primary Care Kalaya Infantino: [name] Other Clinician: Referring Lavonne Cass: Treating Tashera Montalvo/Extender: [name] in Treatment: 57 Wound Status Wound Number: 6 Primary Calcinosis Etiology: Wound Location: Right, Anterior Lower Leg Wound Open Wounding Event: Gradually Appeared Status: Date  Acquired: 05/12/2022 Comorbid Cataracts, Anemia, Lymphedema, Congestive Heart Failure, Deep Weeks Of Treatment: 44 History: Vein Thrombosis, Hypertension, Peripheral Venous Disease Clustered Wound: Yes Photos Wound Measurements Length: (cm) Width: (cm) Depth: (cm) Clustered Quantity: Area: (cm) Volume: (cm) 0.8 % Reduction in Area: -166.1% 1 % Reduction in Volume: -425% 0.2 Epithelialization: Large (67-100%) 2 Tunneling: No 0.628 Undermining: No 0.126 Wound Description Classification: Full Thickness Without Exposed Sup Wound Margin: Distinct, outline attached Exudate Amount: Medium Exudate Type: Serous Exudate Color: amber port Structures Foul Odor After Cleansing: No Slough/Fibrino Yes Wound Bed Granulation Amount: Small (1-33%) Exposed Structure Granulation Quality: Pink Fascia Exposed: No Necrotic Amount: Large (67-100%) Fat Layer (Subcutaneous Tissue) Exposed: Yes Necrotic Quality: Adherent Slough Tendon Exposed: No Muscle Exposed: No Joint Exposed: No Bone Exposed: No Periwound Skin Texture Texture Color No Abnormalities Noted: Yes No Abnormalities Noted: No Erythema: Yes Moisture Rubor: No No Abnormalities Noted: Yes Temperature / Pain Temperature: No Abnormality Dudzinski, Django ([id number]) [id number] Page 10 of 13 Treatment Notes Wound #6 (Lower Leg) Wound Laterality: Right, Anterior Cleanser Peri-Wound Care Sween Lotion (Moisturizing lotion) Discharge Instruction: Apply moisturizing lotion as directed Topical Primary Dressing peel and place vac Secondary Dressing Secured With Compression Wrap ThreePress (3 layer compression wrap) Discharge Instruction: Apply three layer compression as directed. Compression Stockings Add-Ons Electronic Signature(s) Signed: 03/18/2023 12:34:12 PM By: Zenaida Deed RN, BSN Entered By: Zenaida Deed on 03/18/2023  08:37:25 -------------------------------------------------------------------------------- Wound Assessment Details Patient Name: Date of Service: [name], [name] 03/18/2023 11:00 A M Medical Record Number: [id number] Patient Account Number: [id number] Date of Birth/Sex: Treating RN: June 26, 1951 (72 y.o. M) Primary Care Elani Delph: [name] Other Clinician: Referring Naaman Curro: Treating Corey Caulfield/Extender: [name] in Treatment: 57 Wound Status Wound Number: 7 Primary Lymphedema Etiology: Wound Location: Left, Anterior Lower Leg Wound Open Wounding Event: Blister Status: Date Acquired: 03/09/2023 Comorbid Cataracts, Anemia, Lymphedema, Congestive Heart Failure, Deep Weeks Of Treatment: 1 History: Vein Thrombosis, Hypertension, Peripheral Venous Disease Clustered Wound: No Photos Wound Measurements Length: (cm) 1.5 Width: (cm) 2 Depth: (cm) 0.1 Area: (cm) 2.356 Volume: (cm) 0.236 ETON, SENTZ ([id number]) Wound Description Classification: Full Thickness Without Exposed Suppor Wound Margin: Distinct, outline attached Exudate Amount: Medium Exudate Type: Serous Exudate Color: amber t Structures Foul Odor After Cleansing: Slough/Fibrino % Reduction in Area: 33.3% % Reduction in Volume: 33.1% Epithelialization: Medium (34-66%) Tunneling: No Undermining: No [id number] Page 11 of 13 No No Wound Bed Granulation Amount: Large (67-100%) Exposed Structure Granulation Quality: Red Fascia Exposed: No Necrotic Amount: Small (1-33%) Fat Layer (Subcutaneous Tissue) Exposed: Yes Necrotic Quality: Adherent Slough Tendon Exposed: No Muscle Exposed: No Joint Exposed: No Bone Exposed: No Periwound Skin Texture Texture Color No Abnormalities Noted: Yes No Abnormalities Noted: Yes Moisture Temperature / Pain No Abnormalities Noted: Yes Temperature: No Abnormality Treatment Notes Wound #7 (Lower Leg) Wound Laterality:  Left, Anterior Cleanser Soap and Water Discharge Instruction: May shower and wash wound with dial antibacterial soap and water prior to dressing change. Peri-Wound Care Sween Lotion (Moisturizing lotion) Discharge Instruction: Apply moisturizing lotion as directed Topical Primary Dressing Maxorb Extra Ag+ Alginate Dressing, 2x2 (in/in) Discharge Instruction: Apply to wound bed  as instructed Secondary Dressing ABD Pad, 8x10 Discharge Instruction: Apply over primary dressing as directed. Secured With Compression Wrap ThreePress (3 layer compression wrap) Discharge Instruction: Apply three layer compression as directed. Compression Stockings Add-Ons Electronic Signature(s) Signed: 03/18/2023 12:34:12 PM By: Zenaida Deed RN, BSN Entered By: Zenaida Deed on 03/18/2023 08:38:14 -------------------------------------------------------------------------------- Wound Assessment Details Patient Name: Date of Service: [name], [name] 03/18/2023 11:00 A M Medical Record Number: [id number] Patient Account Number: [id number] Date of Birth/Sex: Treating RN: [birth year] (72 y.o. [name] Primary Care Leronda Lewers: [name] Other Clinician: Referring Amarissa Koerner: Treating Sharlena Kristensen/Extender: [name] in Treatment: [address] [place] ([id number]) [id number] Page 12 of 13 Wound Status Wound Number: 8 Primary Lymphedema Etiology: Wound Location: Left, Dorsal Foot Wound Open Wounding Event: Gradually Appeared Status: Date Acquired: 03/18/2023 Comorbid Cataracts, Anemia, Lymphedema, Congestive Heart Failure, Deep Weeks Of Treatment: 0 History: Vein Thrombosis, Hypertension, Peripheral Venous Disease Clustered Wound: No Photos Wound Measurements Length: (cm) 0.5 Width: (cm) 0.5 Depth: (cm) 0.1 Area: (cm) 0.196 Volume: (cm) 0.02 % Reduction in Area: % Reduction in Volume: Epithelialization: Small (1-33%) Tunneling:  No Undermining: No Wound Description Classification: Full Thickness Without Exposed Support Structures Wound Margin: Flat and Intact Exudate Amount: Medium Exudate Type: Serous Exudate Color: amber Foul Odor After Cleansing: No Slough/Fibrino Yes Wound Bed Granulation Amount: None Present (0%) Exposed Structure Necrotic Amount: Large (67-100%) Fascia Exposed: No Necrotic Quality: Adherent Slough Fat Layer (Subcutaneous Tissue) Exposed: Yes Tendon Exposed: No Muscle Exposed: No Joint Exposed: No Bone Exposed: No Periwound Skin Texture Texture Color No Abnormalities Noted: Yes No Abnormalities Noted: Yes Moisture Temperature / Pain No Abnormalities Noted: Yes Temperature: No Abnormality Treatment Notes Wound #8 (Foot) Wound Laterality: Dorsal, Left Cleanser Soap and Water Discharge Instruction: May shower and wash wound with dial antibacterial soap and water prior to dressing change. Peri-Wound Care Sween Lotion (Moisturizing lotion) Discharge Instruction: Apply moisturizing lotion as directed Topical Primary Dressing Maxorb Extra Ag+ Alginate Dressing, 2x2 (in/in) Discharge Instruction: Apply to wound bed as instructed Secondary Dressing ABD Pad, 8x10 Discharge Instruction: Apply over primary dressing as directed. OLAMIPOSI, TOPP ([id number]) [id number] Page 13 of 13 Secured With Compression Wrap Compression Stockings Facilities manager) Signed: 03/18/2023 12:34:12 PM By: Zenaida Deed RN, BSN Signed: 03/22/2023 1:39:28 PM By: Karl Ito Entered By: Karl Ito on 03/18/2023 08:34:53 -------------------------------------------------------------------------------- Vitals Details Patient Name: Date of Service: [name] 03/18/2023 11:00 A M Medical Record Number: [id number] Patient Account Number: [id number] Date of Birth/Sex: Treating RN: 08/04/1950 (72 y.o. M) Primary Care Damyen Knoll: [name] Other  Clinician: Referring Bianco Cange: Treating Jeni Duling/Extender: [name] in Treatment: 34 Vital Signs Time Taken: 11:06 Temperature (F): 98.0 Height (in): 71 Pulse (bpm): 72 Weight (lbs): 267 Respiratory Rate (breaths/min): 18 Body Mass Index (BMI): 37.2 Blood Pressure (mmHg): 156/76 Reference Range: 80 - 120 mg / dl Electronic Signature(s) Signed: 03/18/2023 12:34:12 PM By: Zenaida Deed RN, BSN Entered By: Zenaida Deed on 03/18/2023 08:23:34

## 2023-03-22 NOTE — Progress Notes (Signed)
[name] ([id number]) [id number] Page 1 of 10 Visit Report for 03/11/2023 Arrival Information Details Patient Name: Date of Service: [name] 03/11/2023 11:15 A M Medical Record Number: [id number] Patient Account Number: [id number] Date of Birth/Sex: Treating RN: 1950-08-03 (72 y.o. [name] Primary Care Lawrencia Mauney: [name] Other Clinician: Referring Rosibel Giacobbe: Treating Dyesha Henault/Extender: [name] in Treatment: 65 Visit Information History Since Last Visit Added or deleted any medications: No Patient Arrived: Wheel Chair Any new allergies or adverse reactions: No Arrival Time: 11:01 Had a fall or experienced change in No Accompanied By: self activities of daily living that may affect Transfer Assistance: Manual risk of falls: Patient Identification Verified: Yes Signs or symptoms of abuse/neglect since last visito No Secondary Verification Process Completed: Yes Hospitalized since last visit: No Patient Requires Transmission-Based Precautions: No Implantable device outside of the clinic excluding No Patient Has Alerts: Yes cellular tissue based products placed in the center Patient Alerts: R ABI= N/C, TBI= .75 since last visit: L ABI =N/C, TBI = .57 Has Dressing in Place as Prescribed: Yes Has Compression in Place as Prescribed: Yes Pain Present Now: No Electronic Signature(s) Signed: 03/11/2023 11:50:25 AM By: [name] Entered By: [name] on 03/11/2023 11:02:15 -------------------------------------------------------------------------------- Compression Therapy Details Patient Name: Date of Service: [name], [name] 03/11/2023 11:15 A M Medical Record Number: [id number] Patient Account Number: [id number] Date of Birth/Sex: Treating RN: 1950/11/21 (72 y.o. [name] Primary Care Wynonia Medero: [name] Other Clinician: Referring Randell Detter: Treating Juliahna Wiswell/Extender:  [name] in Treatment: 56 Compression Therapy Performed for Wound Assessment: Wound #3 Right,Medial Lower Leg Performed By: Clinician [name], RN Compression Type: Three Layer Post Procedure Diagnosis Same as Pre-procedure Electronic Signature(s) Signed: 03/11/2023 11:50:25 AM By: [name] Entered By: [name] on 03/11/2023 11:20:05 [name] ([id number]) [id number] Page 2 of 10 -------------------------------------------------------------------------------- Compression Therapy Details Patient Name: Date of Service: [name] 03/11/2023 11:15 A M Medical Record Number: [id number] Patient Account Number: [id number] Date of Birth/Sex: Treating RN: 05/28/1951 (72 y.o. [name] Primary Care Herron Fero: [name] Other Clinician: Referring Stephie Xu: Treating Ikesha Siller/Extender: [name] in Treatment: 56 Compression Therapy Performed for Wound Assessment: Wound #7 Left,Anterior Lower Leg Performed By: Clinician [name], RN Compression Type: Three Layer Post Procedure Diagnosis Same as Pre-procedure Electronic Signature(s) Signed: 03/11/2023 11:50:25 AM By: [name] Entered By: [name] on 03/11/2023 11:20:05 -------------------------------------------------------------------------------- Encounter Discharge Information Details Patient Name: Date of Service: [name] 03/11/2023 11:15 A M Medical Record Number: [id number] Patient Account Number: [id number] Date of Birth/Sex: Treating RN: 1950-10-09 (72 y.o. [name] Primary Care Emon Miggins: [name] Other Clinician: Referring Judge Duque: Treating Vianne Grieshop/Extender: [name] in Treatment: 75 Encounter Discharge Information Items Post Procedure Vitals Discharge Condition: Stable Temperature (F): 98.1 Ambulatory Status:  Wheelchair Pulse (bpm): 66 Discharge Destination: Home Respiratory Rate (breaths/min): 18 Transportation: Private Auto Blood Pressure (mmHg): 145/87 Accompanied By: self Schedule Follow-up Appointment: Yes Clinical Summary of Care: Patient Declined Electronic Signature(s) Signed: 03/11/2023 11:50:25 AM By: [name] Entered By: [name] on 03/11/2023 11:46:19 -------------------------------------------------------------------------------- Lower Extremity Assessment Details Patient Name: Date of Service: [name], [name] 03/11/2023 11:15 A M Medical Record Number: [id number] Patient Account Number: [id number] Date of Birth/Sex: Treating RN: 1951-06-18 (72 y.o. [name] Primary Care Arleta Ostrum: [name] Other Clinician: Referring [name]: Treating Lavaya Defreitas/Extender: [name] in Treatment: 56 Edema Assessment Assessed: Kyra Searles: No] Franne Forts: No] Edema: [Left: Yes] [Right:  Yes] [name] ([id number]) [id number] Page 3 of 10 Left: Right: Point of Measurement: From Medial Instep 40 cm 41.5 cm Ankle Left: Right: Point of Measurement: From Medial Instep 27 cm 24.8 cm Vascular Assessment Pulses: Dorsalis Pedis Palpable: [Left:Yes] [Right:Yes] Extremity colors, hair growth, and conditions: Extremity Color: [Left:Hyperpigmented] [Right:Hyperpigmented] Hair Growth on Extremity: [Left:Yes] [Right:Yes] Temperature of Extremity: [Left:Warm < 3 seconds] [Right:Warm < 3 seconds] Electronic Signature(s) Signed: 03/11/2023 11:50:25 AM By: [name] Entered By: [name] on 03/11/2023 11:05:48 -------------------------------------------------------------------------------- Multi Wound Chart Details Patient Name: Date of Service: [name] 03/11/2023 11:15 A M Medical Record Number: [id number] Patient Account Number: [id number] Date of Birth/Sex: Treating RN: [birth year] (72 y.o.  M) Primary Care Scotti Kosta: [name] Other Clinician: Referring Jaileen Janelle: Treating Evangelyn Crouse/Extender: [name] in Treatment: 37 Vital Signs Height(in): 71 Pulse(bpm): 66 Weight(lbs): 267 Blood Pressure(mmHg): 145/87 Body Mass Index(BMI): 37.2 Temperature(F): 98.1 Respiratory Rate(breaths/min): 18 [3:Photos:] Right, Medial Lower Leg Right, Anterior Lower Leg Left, Anterior Lower Leg Wound Location: Gradually Appeared Gradually Appeared Blister Wounding Event: Lymphedema Calcinosis Lymphedema Primary Etiology: Cataracts, Anemia, Lymphedema, Cataracts, Anemia, Lymphedema, Cataracts, Anemia, Lymphedema, Comorbid History: Congestive Heart Failure, Deep Vein Congestive Heart Failure, Deep Vein Congestive Heart Failure, Deep Vein Thrombosis, Hypertension, Peripheral Thrombosis, Hypertension, Peripheral Thrombosis, Hypertension, Peripheral Venous Disease Venous Disease Venous Disease 12/07/2021 05/12/2022 03/09/2023 Date Acquired: 56 43 0 Weeks of Treatment: Open Open Open Wound Status: No No No Wound Recurrence: No Yes No Clustered Wound: N/A 2 N/A Clustered Quantity: 10x9x0.4 1x1.5x0.2 1.5x3x0.1 Measurements L x W x D (cm) 70.686 1.178 3.534 A (cm) : rea 28.274 0.236 0.353 Volume (cm) : -1223.50% -399.20% N/A % Reduction in Area: -1223.70% -883.30% N/A % Reduction in VolumeDREGAN, PALUBICKI ([id number]) [id number] Page 4 of 10 Full Thickness With Exposed Support Full Thickness Without Exposed Full Thickness Without Exposed Classification: Structures Support Structures Support Structures Large Medium Medium Exudate A mount: Serosanguineous Serous Serous Exudate Type: red, brown amber amber Exudate Color: Indistinct, nonvisible Distinct, outline attached Distinct, outline attached Wound Margin: Medium (34-66%) Small (1-33%) Large (67-100%) Granulation A mount: Red, Pink Pink Red Granulation  Quality: Medium (34-66%) Large (67-100%) None Present (0%) Necrotic A mount: Fat Layer (Subcutaneous Tissue): Yes Fat Layer (Subcutaneous Tissue): Yes Fascia: No Exposed Structures: Fascia: No Fascia: No Fat Layer (Subcutaneous Tissue): No Tendon: No Tendon: No Tendon: No Muscle: No Muscle: No Muscle: No Joint: No Joint: No Joint: No Bone: No Bone: No Bone: No Small (1-33%) Large (67-100%) None Epithelialization: Debridement - Selective/Open Wound Debridement - Selective/Open Wound N/A Debridement: Pre-procedure Verification/Time Out 11:18 11:18 N/A Taken: Lidocaine 4% Topical Solution Lidocaine 4% Topical Solution N/A Pain Control: [employer] N/A Tissue Debrided: Non-Viable Tissue Non-Viable Tissue N/A Level: 49.46 1.18 N/A Debridement A (sq cm): rea Curette Curette N/A Instrument: Minimum Minimum N/A Bleeding: Pressure Pressure N/A Hemostasis A chieved: Procedure was tolerated well Procedure was tolerated well N/A Debridement Treatment Response: 10x9x0.4 1x1.5x0.2 N/A Post Debridement Measurements L x W x D (cm) 28.274 0.236 N/A Post Debridement Volume: (cm) Excoriation: Yes Excoriation: No No Abnormalities Noted Periwound Skin Texture: No Abnormalities Noted No Abnormalities Noted No Abnormalities Noted Periwound Skin Moisture: Erythema: No Erythema: No No Abnormalities Noted Periwound Skin Color: Rubor: No No Abnormality No Abnormality No Abnormality Temperature: Compression Therapy Debridement Compression Therapy Procedures Performed: Debridement Negative Pressure Wound Therapy Application (NPWT) Treatment Notes Electronic Signature(s) Signed: 03/11/2023 11:30:46 AM By: Duanne Guess MD FACS Entered By: Duanne Guess on 03/11/2023 11:30:45 -------------------------------------------------------------------------------- Multi-Disciplinary Care Plan  Details Patient Name: Date of Service: [name], [name] 03/11/2023 11:15 A M Medical  Record Number: [id number] Patient Account Number: [id number] Date of Birth/Sex: Treating RN: Mar 11, 1951 (72 y.o. [name] Primary Care Raha Tennison: [name] Other Clinician: Referring Quill Grinder: Treating Alvey Brockel/Extender: [name] in Treatment: 66 Multidisciplinary Care Plan reviewed with physician Active Inactive Venous Leg Ulcer Nursing Diagnoses: Knowledge deficit related to disease process and management Potential for venous Insuffiency (use before diagnosis confirmed) Goals: Patient will maintain optimal edema control Date Initiated: 02/17/2022 Target Resolution Date: 03/23/2023 Goal Status: Active KAPENA, WALLA ([id number]) [id number] Page 5 of 10 Interventions: Assess peripheral edema status every visit. Compression as ordered Provide education on venous insufficiency Treatment Activities: Non-invasive vascular studies : 02/17/2022 Therapeutic compression applied : 02/17/2022 Notes: Wound/Skin Impairment Nursing Diagnoses: Impaired tissue integrity Knowledge deficit related to ulceration/compromised skin integrity Goals: Patient/caregiver will verbalize understanding of skin care regimen Date Initiated: 02/17/2022 Target Resolution Date: 03/23/2023 Goal Status: Active Ulcer/skin breakdown will have a volume reduction of 30% by week 4 Date Initiated: 02/17/2022 Date Inactivated: 03/10/2022 Target Resolution Date: 03/10/2022 Unmet Reason: infection being treated, Goal Status: Unmet waiting on derm appte Ulcer/skin breakdown will have a volume reduction of 50% by week 8 Date Initiated: 03/10/2022 Date Inactivated: 04/14/2022 Target Resolution Date: 04/07/2022 Goal Status: Unmet Unmet Reason: calcinosis Ulcer/skin breakdown will have a volume reduction of 80% by week 12 Date Initiated: 04/14/2022 Date Inactivated: 05/05/2022 Target Resolution Date: 05/05/2022 Unmet Reason: infection, chronic Goal  Status: Unmet condition Interventions: Assess patient/caregiver ability to obtain necessary supplies Assess patient/caregiver ability to perform ulcer/skin care regimen upon admission and as needed Assess ulceration(s) every visit Provide education on ulcer and skin care Treatment Activities: Skin care regimen initiated : 02/17/2022 Topical wound management initiated : 02/17/2022 Notes: Electronic Signature(s) Signed: 03/11/2023 11:50:25 AM By: [name] Entered By: [name] on 03/11/2023 11:44:56 -------------------------------------------------------------------------------- Negative Pressure Wound Therapy Application (NPWT) Details Patient Name: Date of Service: [name], [name] 03/11/2023 11:15 A M Medical Record Number: [id number] Patient Account Number: [id number] Date of Birth/Sex: Treating RN: Jun 25, 1951 (72 y.o. [name] Primary Care Youa Deloney: [name] Other Clinician: Referring Maven Rosander: Treating Silver Parkey/Extender: [name] in Treatment: 56 NPWT Application Performed for: Wound #3 Right, Medial Lower Leg Additional Injuries Covered: Yes Additional Injuries: Wound#6 Right, Anterior Lower Leg Performed By: [name], RN Type: VAC System Coverage Size (sq cm): 91.5 Pressure Type: Constant Pressure Setting: 125 mmHG Drain Type: None Primary Contact: Other Quantity of Sponges/Gauze InsertedHANEEF, ITEN ([id number]) [id number] Page 6 of 10 Sponge/Dressing Type: Foam- Black Date Initiated: 03/11/2023 Response to Treatment: tolerated well Post Procedure Diagnosis Same as Pre-procedure Electronic Signature(s) Signed: 03/11/2023 11:50:25 AM By: [name] Entered By: [name] on 03/11/2023 11:19:52 -------------------------------------------------------------------------------- Pain Assessment Details Patient Name: Date of Service: [name], [name] 03/11/2023  11:15 A M Medical Record Number: [id number] Patient Account Number: [id number] Date of Birth/Sex: Treating RN: 1951/06/13 (72 y.o. [name] Primary Care Amran Malter: [name] Other Clinician: Referring Mella Inclan: Treating Alicen Donalson/Extender: [name] in Treatment: 56 Active Problems Location of Pain Severity and Description of Pain Patient Has Paino No Site Locations Rate the pain. Current Pain Level: 0 Pain Management and Medication Current Pain Management: Electronic Signature(s) Signed: 03/11/2023 11:50:25 AM By: [name] Entered By: [name] on 03/11/2023 11:02:39 -------------------------------------------------------------------------------- Patient/Caregiver Education Details Patient Name: Date of Service: [place], [name] 8/16/2024andnbsp11:15 A M Medical Record Number: [id number] Patient Account Number: [id number] Date  of Birth/Gender: Treating RN: 02/17/1951 (72 y.o. [name] Primary Care Physician: [name] Other Clinician: Referring Physician: Treating Physician/Extender: Kaizen, Brutus, [name] ([id number]) [id number] Page 7 of 10 Weeks in Treatment: 28 Education Assessment Education Provided To: Patient Education Topics Provided Wound/Skin Impairment: Methods: Explain/Verbal Responses: Reinforcements needed, State content correctly Electronic Signature(s) Signed: 03/11/2023 11:50:25 AM By: [name] Entered By: [name] on 03/11/2023 11:45:26 -------------------------------------------------------------------------------- Wound Assessment Details Patient Name: Date of Service: [name], [name] 03/11/2023 11:15 A M Medical Record Number: [id number] Patient Account Number: [id number] Date of Birth/Sex: Treating RN: January 14, 1951 (72 y.o. [name] Primary Care Jermayne Sweeney: [name] Other  Clinician: Referring Willistine Ferrall: Treating Parlee Amescua/Extender: [name] in Treatment: 56 Wound Status Wound Number: 3 Primary Lymphedema Etiology: Wound Location: Right, Medial Lower Leg Wound Open Wounding Event: Gradually Appeared Status: Date Acquired: 12/07/2021 Comorbid Cataracts, Anemia, Lymphedema, Congestive Heart Failure, Deep Weeks Of Treatment: 56 History: Vein Thrombosis, Hypertension, Peripheral Venous Disease Clustered Wound: No Photos Wound Measurements Length: (cm) 10 Width: (cm) 9 Depth: (cm) 0.4 Area: (cm) 70.686 Volume: (cm) 28.274 % Reduction in Area: -1223.5% % Reduction in Volume: -1223.7% Epithelialization: Small (1-33%) Tunneling: No Undermining: No Wound Description Classification: Full Thickness With Exposed Support Wound Margin: Indistinct, nonvisible Exudate Amount: Large Exudate Type: Serosanguineous Exudate Color: red, brown Structures Foul Odor After Cleansing: No Slough/Fibrino Yes Wound Bed [name], [name] ([id number]) [id number] Page 8 of 10 Granulation Amount: Medium (34-66%) Exposed Structure Granulation Quality: Red, Pink Fascia Exposed: No Necrotic Amount: Medium (34-66%) Fat Layer (Subcutaneous Tissue) Exposed: Yes Necrotic Quality: Adherent Slough Tendon Exposed: No Muscle Exposed: No Joint Exposed: No Bone Exposed: No Periwound Skin Texture Texture Color No Abnormalities Noted: No No Abnormalities Noted: No Excoriation: Yes Erythema: No Moisture Temperature / Pain No Abnormalities Noted: Yes Temperature: No Abnormality Electronic Signature(s) Signed: 03/11/2023 11:50:25 AM By: [name] Signed: 03/22/2023 2:03:55 PM By: Brenton Grills Entered By: Brenton Grills on 03/11/2023 11:10:06 -------------------------------------------------------------------------------- Wound Assessment Details Patient Name: Date of Service: [name] 03/11/2023 11:15 A  M Medical Record Number: [id number] Patient Account Number: [id number] Date of Birth/Sex: Treating RN: [birth year] (72 y.o. [name] Primary Care Avelina Mcclurkin: [name] Other Clinician: Referring Alexyia Guarino: Treating Rosenda Geffrard/Extender: [name] in Treatment: 56 Wound Status Wound Number: 6 Primary Calcinosis Etiology: Wound Location: Right, Anterior Lower Leg Wound Open Wounding Event: Gradually Appeared Status: Date Acquired: 05/12/2022 Comorbid Cataracts, Anemia, Lymphedema, Congestive Heart Failure, Deep Weeks Of Treatment: 43 History: Vein Thrombosis, Hypertension, Peripheral Venous Disease Clustered Wound: Yes Photos Wound Measurements Length: (cm) Width: (cm) Depth: (cm) Clustered Quantity: Area: (cm) Volume: (cm) 1 % Reduction in Area: -399.2% 1.5 % Reduction in Volume: -883.3% 0.2 Epithelialization: Large (67-100%) 2 Tunneling: No 1.178 Undermining: No 0.236 Wound Description Classification: Full Thickness Without Exposed Support Structures Wound Margin: Distinct, outline attached Exudate Amount: Medium Exudate Type: Serous Exudate Color: amber [name], [name] ([id number]) Foul Odor After Cleansing: No Slough/Fibrino Yes [id number].pdf Page 9 of 10 Wound Bed Granulation Amount: Small (1-33%) Exposed Structure Granulation Quality: Pink Fascia Exposed: No Necrotic Amount: Large (67-100%) Fat Layer (Subcutaneous Tissue) Exposed: Yes Necrotic Quality: Adherent Slough Tendon Exposed: No Muscle Exposed: No Joint Exposed: No Bone Exposed: No Periwound Skin Texture Texture Color No Abnormalities Noted: Yes No Abnormalities Noted: No Erythema: No Moisture Rubor: No No Abnormalities Noted: Yes Temperature / Pain Temperature: No Abnormality Electronic Signature(s) Signed: 03/11/2023 11:50:25 AM By: [name] Signed: 03/22/2023 2:03:55 PM By: Brenton Grills Entered  By: Brenton Grills on  03/11/2023 11:10:27 -------------------------------------------------------------------------------- Wound Assessment Details Patient Name: Date of Service: [name], [name] 03/11/2023 11:15 A M Medical Record Number: [id number] Patient Account Number: [id number] Date of Birth/Sex: Treating RN: [birth year] (72 y.o. [name] Primary Care Sander Remedios: [name] Other Clinician: Referring Iram Astorino: Treating Cyenna Rebello/Extender: [name] in Treatment: 56 Wound Status Wound Number: 7 Primary Lymphedema Etiology: Wound Location: Left, Anterior Lower Leg Wound Open Wounding Event: Blister Status: Date Acquired: 03/09/2023 Comorbid Cataracts, Anemia, Lymphedema, Congestive Heart Failure, Deep Weeks Of Treatment: 0 History: Vein Thrombosis, Hypertension, Peripheral Venous Disease Clustered Wound: No Photos Wound Measurements Length: (cm) 1.5 Width: (cm) 3 Depth: (cm) 0.1 Area: (cm) 3.534 Volume: (cm) 0.353 % Reduction in Area: % Reduction in Volume: Epithelialization: None Tunneling: No Undermining: No Wound Description Classification: Full Thickness Without Exposed Support Structures Wound Margin: Distinct, outline attached Exudate Amount: Medium [name], [name] ([id number]) Exudate Type: Serous Exudate Color: amber Foul Odor After Cleansing: No Slough/Fibrino No [id number] Page 10 of 10 Wound Bed Granulation Amount: Large (67-100%) Exposed Structure Granulation Quality: Red Fascia Exposed: No Necrotic Amount: None Present (0%) Fat Layer (Subcutaneous Tissue) Exposed: No Tendon Exposed: No Muscle Exposed: No Joint Exposed: No Bone Exposed: No Periwound Skin Texture Texture Color No Abnormalities Noted: Yes No Abnormalities Noted: Yes Moisture Temperature / Pain No Abnormalities Noted: Yes Temperature: No Abnormality Electronic Signature(s) Signed: 03/11/2023 11:50:25 AM By: [name] Signed:  03/22/2023 2:03:55 PM By: Brenton Grills Entered By: Brenton Grills on 03/11/2023 11:10:52 -------------------------------------------------------------------------------- Vitals Details Patient Name: Date of Service: [name] 03/11/2023 11:15 A M Medical Record Number: [id number] Patient Account Number: [id number] Date of Birth/Sex: Treating RN: 1950-11-06 (72 y.o. [name] Primary Care Sadie Hazelett: [name] Other Clinician: Referring Reeda Soohoo: Treating Makya Phillis/Extender: [name] in Treatment: 56 Vital Signs Time Taken: 11:02 Temperature (F): 98.1 Height (in): 71 Pulse (bpm): 66 Weight (lbs): 267 Respiratory Rate (breaths/min): 18 Body Mass Index (BMI): 37.2 Blood Pressure (mmHg): 145/87 Reference Range: 80 - 120 mg / dl Electronic Signature(s) Signed: 03/11/2023 11:50:25 AM By: [name] Entered By: [name] on 03/11/2023 11:02:32

## 2023-03-22 NOTE — Progress Notes (Signed)
CHANTZ, LAHAIE ([id number]) [id number] Page 1 of 5 Visit Report for 03/22/2023 Arrival Information Details Patient Name: Date of Service: [name], SURRENCY 03/22/2023 11:15 A M Medical Record Number: [id number] Patient Account Number: [id number] Date of Birth/Sex: Treating RN: May 06, 1951 (72 y.o. [name] Primary Care Ayesha Markwell: [name] Other Clinician: Referring Wakeelah Solan: Treating Joshu Furukawa/Extender: [name] in Treatment: 83 Visit Information History Since Last Visit Added or deleted any medications: No Patient Arrived: Wheel Chair Any new allergies or adverse reactions: No Arrival Time: 11:15 Had a fall or experienced change in No Accompanied By: self activities of daily living that may affect Transfer Assistance: None risk of falls: Patient Identification Verified: Yes Signs or symptoms of abuse/neglect since last visito No Secondary Verification Process Completed: Yes Hospitalized since last visit: No Patient Requires Transmission-Based Precautions: No Implantable device outside of the clinic excluding No Patient Has Alerts: Yes cellular tissue based products placed in the center Patient Alerts: R ABI= N/C, TBI= .75 since last visit: L ABI =N/C, TBI = .57 Has Dressing in Place as Prescribed: Yes Has Compression in Place as Prescribed: Yes Pain Present Now: No Electronic Signature(s) Signed: 03/22/2023 4:18:27 PM By: Zenaida Deed RN, BSN Entered By: Zenaida Deed on 03/22/2023 12:10:13 -------------------------------------------------------------------------------- Compression Therapy Details Patient Name: Date of Service: [name] 03/22/2023 11:15 A M Medical Record Number: [id number] Patient Account Number: [id number] Date of Birth/Sex: Treating RN: February 26, 1951 (72 y.o. [name] Primary Care Brittni Hult: [name] Other Clinician: Referring Jonel Weldon: Treating Trevian Hayashida/Extender: [name] in Treatment: 68 Compression Therapy Performed for Wound Assessment: Wound #3 Right,Medial Lower Leg Performed By: Clinician Zenaida Deed, RN Compression Type: Three Emergency planning/management officer) Signed: 03/22/2023 4:18:27 PM By: Zenaida Deed RN, BSN Entered By: Zenaida Deed on 03/22/2023 12:15:12 -------------------------------------------------------------------------------- Encounter Discharge Information Details Patient Name: Date of Service: [name] 03/22/2023 11:15 A M Medical Record Number: [id number] Patient Account Number: [id number] [name] ([id number]) [id number] Page 2 of 5 Date of Birth/Sex: Treating RN: 10-19-1950 (72 y.o. [name] Primary Care Caral Whan: Other Clinician: [name] Referring Renato Spellman: Treating Elster Corbello/Extender: [name] in Treatment: 34 Encounter Discharge Information Items Discharge Condition: Stable Ambulatory Status: Wheelchair Discharge Destination: Home Transportation: Private Auto Accompanied By: self Schedule Follow-up Appointment: Yes Clinical Summary of Care: Electronic Signature(s) Signed: 03/22/2023 4:18:27 PM By: Zenaida Deed RN, BSN Entered By: Zenaida Deed on 03/22/2023 12:17:00 -------------------------------------------------------------------------------- Negative Pressure Wound Therapy Maintenance (NPWT) Details Patient Name: Date of Service: [name] 03/22/2023 11:15 A M Medical Record Number: [id number] Patient Account Number: [id number] Date of Birth/Sex: Treating RN: 06/06/1951 (72 y.o. [name] Primary Care Angela Vazguez: [name] Other Clinician: Referring Bernhardt Riemenschneider: Treating Rosalie Gelpi/Extender: [name] in Treatment: 58 NPWT Maintenance Performed for: Wound #3 Right, Medial Lower Leg Additional Injuries Covered: Yes Additional Injuries: Wound #6  Right, Anterior Lower Leg Performed By: Zenaida Deed, RN Type: VAC System Coverage Size (sq cm): 84 Pressure Type: Constant Pressure Setting: 125 mmHG Drain Type: None Primary Contact: None Sponge/Dressing Type: Foam- Black Date Initiated: 03/11/2023 Dressing Removed: Yes Quantity of Sponges/Gauze Removed: 1 Canister Changed: No Dressing Reapplied: Yes Quantity of Sponges/Gauze Inserted: 1 Respones T Treatment: o good Days On NPWT : 12 Notes peel and place dressing, pt changed canister x2 since Friday Electronic Signature(s) Signed: 03/22/2023 4:18:27 PM By: Zenaida Deed RN, BSN Entered By: Zenaida Deed on 03/22/2023 12:14:09 -------------------------------------------------------------------------------- Patient/Caregiver Education Details Patient Name: Date of Service: [name] Nakayama  [name] 8/27/2024andnbsp11:15 A M Medical Record Number: [id number] Patient Account Number: [id number] Date of Birth/Gender: Treating RN: [birth year] (72 y.o. [name] [place], Grant ([id number]) [id number] Page 3 of 5 Primary Care Physician: [name] Other Clinician: Referring Physician: Treating Physician/Extender: [name] in Treatment: 43 Education Assessment Education Provided To: Patient Education Topics Provided Venous: Methods: Explain/Verbal Responses: Reinforcements needed, State content correctly Electronic Signature(s) Signed: 03/22/2023 4:18:27 PM By: Zenaida Deed RN, BSN Entered By: Zenaida Deed on 03/22/2023 12:16:31 -------------------------------------------------------------------------------- Wound Assessment Details Patient Name: Date of Service: [name] 03/22/2023 11:15 A M Medical Record Number: [id number] Patient Account Number: [id number] Date of Birth/Sex: Treating RN: [birth year] (72 y.o. [name] Primary Care Maryann Mccall: [name] Other Clinician: Referring  Geisha Abernathy: Treating Kaena Santori/Extender: [name] in Treatment: 58 Wound Status Wound Number: 3 Primary Etiology: Lymphedema Wound Location: Right, Medial Lower Leg Wound Status: Open Wounding Event: Gradually Appeared Date Acquired: 12/07/2021 Weeks Of Treatment: 58 Clustered Wound: No Wound Measurements Length: (cm) 10.4 Width: (cm) 8 Depth: (cm) 0.2 Area: (cm) 65.345 Volume: (cm) 13.069 % Reduction in Area: -1123.5% % Reduction in Volume: -511.8% Wound Description Classification: Full Thickness With Exposed Support Exudate Amount: Large Exudate Type: Serous Exudate Color: amber Structures Periwound Skin Texture Texture Color No Abnormalities Noted: No No Abnormalities Noted: No Moisture No Abnormalities Noted: No Treatment Notes Wound #3 (Lower Leg) Wound Laterality: Right, Medial Cleanser Peri-Wound Care Skin Prep Discharge Instruction: cavelon to macerated wound margins [name] ([id number]) [id number] Page 4 of 5 Sween Lotion (Moisturizing lotion) Discharge Instruction: Apply moisturizing lotion as directed Topical Primary Dressing peel and place vac Secondary Dressing Secured With Compression Wrap ThreePress (3 layer compression wrap) Discharge Instruction: Apply three layer compression as directed. Compression Stockings Add-Ons Electronic Signature(s) Signed: 03/22/2023 4:18:27 PM By: Zenaida Deed RN, BSN Entered By: Zenaida Deed on 03/22/2023 12:11:36 -------------------------------------------------------------------------------- Wound Assessment Details Patient Name: Date of Service: AAQIB, FREUDENBERG 03/22/2023 11:15 A M Medical Record Number: [id number] Patient Account Number: [id number] Date of Birth/Sex: Treating RN: 1951-01-02 (72 y.o. [name] Primary Care Callan Yontz: [name] Other Clinician: Referring Amere Iott: Treating Kiran Lapine/Extender: [name] in Treatment: 58 Wound Status Wound Number: 6 Primary Etiology: Calcinosis Wound Location: Right, Anterior Lower Leg Wound Status: Open Wounding Event: Gradually Appeared Date Acquired: 05/12/2022 Weeks Of Treatment: 44 Clustered Wound: Yes Wound Measurements Length: (cm) 0.8 Width: (cm) 1 Depth: (cm) 0.2 Area: (cm) 0.628 Volume: (cm) 0.126 % Reduction in Area: -166.1% % Reduction in Volume: -425% Wound Description Classification: Full Thickness Without Exposed Suppor Exudate Amount: Medium Exudate Type: Serous Exudate Color: amber t Structures Periwound Skin Texture Texture Color No Abnormalities Noted: No No Abnormalities Noted: No Moisture No Abnormalities Noted: No Treatment Notes Wound #6 (Lower Leg) Wound Laterality: Right, Anterior Cleanser Peri-Wound Care KENDAL, GAW ([id number]) [id number] Page 5 of 5 Sween Lotion (Moisturizing lotion) Discharge Instruction: Apply moisturizing lotion as directed Topical Primary Dressing peel and place vac Secondary Dressing Secured With Compression Wrap ThreePress (3 layer compression wrap) Discharge Instruction: Apply three layer compression as directed. Compression Stockings Add-Ons Electronic Signature(s) Signed: 03/22/2023 4:18:27 PM By: Zenaida Deed RN, BSN Entered By: Zenaida Deed on 03/22/2023 12:11:36

## 2023-03-22 NOTE — Progress Notes (Signed)
LATRAY, SURRENCY ([id number]) [id number] Page 1 of 1 Visit Report for 03/22/2023 SuperBill Details Patient Name: Date of Service: [name], [name] 03/22/2023 Medical Record Number: [id number] Patient Account Number: [id number] Date of Birth/Sex: Treating RN: 1951/02/18 (72 y.o. 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) I89.0 Lymphedema, not elsewhere classified I10 Essential (primary) hypertension I25.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 angina pectoris Facility Procedures CPT4 Code Description Modifier Quantity [id number] 97606 - WOUND VAC-GREATER TH 50 SQ CM 1 [id number] (Facility Use Only) 36644IH - APPLY MULTLAY COMPRS LWR RT LEG 59 1 Electronic Signature(s) Signed: 03/22/2023 12:43:24 PM By: Duanne Guess MD FACS Signed: 03/22/2023 4:18:27 PM By: Zenaida Deed RN, BSN Entered By: Zenaida Deed on 03/22/2023 12:17:23

## 2023-03-25 ENCOUNTER — Encounter (HOSPITAL_BASED_OUTPATIENT_CLINIC_OR_DEPARTMENT_OTHER): Payer: Medicare Other | Admitting: General Surgery

## 2023-03-25 DIAGNOSIS — L97521 Non-pressure chronic ulcer of other part of left foot limited to breakdown of skin: Secondary | ICD-10-CM | POA: Diagnosis not present

## 2023-03-25 DIAGNOSIS — L97812 Non-pressure chronic ulcer of other part of right lower leg with fat layer exposed: Secondary | ICD-10-CM | POA: Diagnosis not present

## 2023-03-25 DIAGNOSIS — L942 Calcinosis cutis: Secondary | ICD-10-CM | POA: Diagnosis not present

## 2023-03-25 DIAGNOSIS — L98492 Non-pressure chronic ulcer of skin of other sites with fat layer exposed: Secondary | ICD-10-CM | POA: Diagnosis not present

## 2023-03-25 DIAGNOSIS — I890 Lymphedema, not elsewhere classified: Secondary | ICD-10-CM | POA: Diagnosis not present

## 2023-03-25 DIAGNOSIS — I2510 Atherosclerotic heart disease of native coronary artery without angina pectoris: Secondary | ICD-10-CM | POA: Diagnosis not present

## 2023-03-25 DIAGNOSIS — I872 Venous insufficiency (chronic) (peripheral): Secondary | ICD-10-CM | POA: Diagnosis not present

## 2023-03-25 DIAGNOSIS — L97821 Non-pressure chronic ulcer of other part of left lower leg limited to breakdown of skin: Secondary | ICD-10-CM | POA: Diagnosis not present

## 2023-03-25 NOTE — Progress Notes (Signed)
AHMIR, [name] [name] ([id number]) [id number] Page 1 of 16 Visit Report for 03/25/2023 Chief Complaint Document Details Patient Name: Date of Service: [name] [name], [name] [name] 03/25/2023 11:00 A M Medical Record Number: [id number] Patient Account Number: [id number] Date of Birth/Sex: Treating RN: Jul 18, 1951 (72 y.o. M) Primary Care Provider: [name] Other Clinician: Referring Provider: Treating Provider/Extender: [name] in Treatment: 36 Information Obtained from: Patient Chief Complaint Patient seen for complaints of Non-Healing Wounds. Electronic Signature(s) Signed: 03/25/2023 11:39:11 AM By: Duanne Guess MD FACS Entered By: Duanne Guess on 03/25/2023 08:39:11 -------------------------------------------------------------------------------- Debridement Details Patient Name: Date of Service: [name] [name] 03/25/2023 11:00 A M Medical Record Number: [id number] Patient Account Number: [id number] Date of Birth/Sex: Treating RN: 11/05/1950 (72 y.o. [name] [name] Primary Care Provider: [name] Other Clinician: Referring Provider: Treating Provider/Extender: [name] in Treatment: 58 Debridement Performed for Assessment: Wound #3 Right,Medial Lower Leg Performed By: Physician Duanne Guess, MD Debridement Type: Debridement Level of Consciousness (Pre-procedure): Awake and Alert Pre-procedure Verification/Time Out Yes - 11:30 Taken: Start Time: 11:31 Pain Control: Lidocaine 4% T opical Solution Percent of Wound Bed Debrided: 80% T Area Debrided (cm): otal 31.09 Tissue and other material debrided: Non-Viable, Slough, Slough Level: Non-Viable Tissue Debridement Description: Selective/Open Wound Instrument: Curette Bleeding: Minimum Hemostasis Achieved: Pressure Procedural Pain: 3 Post Procedural Pain: 2 Response to Treatment: Procedure was tolerated well Level of Consciousness  (Post- Awake and Alert procedure): Post Debridement Measurements of Total Wound Length: (cm) 9 Width: (cm) 5.5 Depth: (cm) 0.3 Volume: (cm) 11.663 Character of Wound/Ulcer Post Debridement: Requires Further Debridement Post Procedure Diagnosis [name], [name] ([id number]) [id number] Page 2 of 16 Same as Pre-procedure Notes scribed for [name] [name] by [name] Deed, RN Electronic Signature(s) Signed: 03/25/2023 12:16:34 PM By: [name] Deed RN, BSN Signed: 03/25/2023 12:18:18 PM By: Duanne Guess MD FACS Entered By: [name] [name] on 03/25/2023 08:32:11 -------------------------------------------------------------------------------- HPI Details Patient Name: Date of Service: [name] [name] 03/25/2023 11:00 A M Medical Record Number: [id number] Patient Account Number: [id number] Date of Birth/Sex: Treating RN: 1951-07-20 (72 y.o. M) Primary Care Provider: [name] Other Clinician: Referring Provider: Treating Provider/Extender: [name] in Treatment: 53 History of Present Illness HPI Description: ADMISSION 02/09/2022 This is a 72 year old man who recently moved to [place] from [place]. He has a history of of coronary artery disease and prior left popliteal vein DVT . He is not diabetic and does not smoke. 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 He has had multiple spinal surgeries and has limited mobility. He primarily uses a motorized wheelchair. He has had lymphedema for many years and does have lymphedema pumps although he says he does not use them frequently. He has wounds on his bilateral lower extremities. He was receiving care in [place] for these prior to his move. They were apparently packing his wounds with Betadine soaked gauze. He has worn compression wraps in the past but not recently. He did say that they helped tremendously. In clinic today, his right ABI is noncompressible but has  good signals; the left ABI was 1.17. No formal vascular studies have been performed. On his left dorsal foot, there is a circular lesion that exposes the fat layer. There is fairly heavy slough accumulation within the wound, but no significant odor. On his right medial calf, he has multiple pinholes that have slough buildup in them and probe for about 2 to 4 mm in depth. They are draining clear fluid. On his  right lateral midfoot, he has ulceration consistent with venous stasis. It is geographic and has slough accumulation. 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 02/17/2022: All of the wounds are little bit larger today and have more slough accumulation. Today, the medial calf openings are larger and probe to something that feels calcified. There is odor coming from his wounds. His vascular studies are scheduled for August 9 and 10. 02-24-2022 upon evaluation today patient presents for follow-up concerning his ongoing issues here with his lower extremities. With that being said he does have significant problems with what appears to be cellulitis unfortunately the PCR culture that was sent last week got crushed by [employer] in transit. With that being said we are going to reobtain a culture today although I am actually to do it on the right medial leg as this seems to be the most inflamed area today where there is some questionable drainage as well. I am going to remove some of the calcium deposits which are loosening obtain a deeper culture from this area. Fortunately I do not see any evidence of active infection systemically which is good news but locally I think we are having a bigger issue here. He does have his appointment next Thursday with vascular. Patient has also been referred to [facility] for further evaluation and treatment of the calcinosis for possible sulfate injections. 03/04/2022: The culture that was  performed last week grew out multiple species including Klebsiella, Morganella, and Pseudomonas aeruginosa. He had been prescribed Bactrim but this was not adequate to cover all of the species and levofloxacin was recommended. He completed the Bactrim but did not pick up the levofloxacin and begin taking it until yesterday. 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 He had arterial studies performed today. He does have evidence of peripheral arterial disease. Results copied here: +-------+-----------+-----------+------------+------------+ ABI/TBIT oday's ABIT oday's TBIPrevious ABIPrevious TBI +-------+-----------+-----------+------------+------------+ Right [place] 0.75    +-------+-----------+-----------+------------+------------+ Left [place] 0.57    +-------+-----------+-----------+------------+------------+ Arterial wall calcification precludes accurate ankle pressures and ABIs. Summary: Right: Resting right ankle-brachial index indicates noncompressible right lower extremity arteries. The right toe-brachial index is normal. Left: Resting left ankle-brachial index indicates noncompressible left lower extremity arteries. The left toe-brachial index is abnormal. He continues to have further tissue breakdown at the right medial calf and there is ample slough accumulation along with necrotic subcutaneous tissue. The left dorsal foot wound has built up slough, as well. The right medial foot wound is nearly closed with just a light layer of eschar over the surface. The right dorsal EVERHETT, SALSMAN ([id number]) [id number] Page 3 of 16 lateral foot wound has also accumulated a fair amount of slough and remains quite tender. 03/10/2022: He has been taking the prescribed levofloxacin and has about 3 days  left of this. The erythema and induration on his right leg has improved. He continues to experience further breakdown of the right medial calf wound. There is extensive slough and debris accumulation there. There is heavy slough on his left dorsal foot wound, but no odor or purulent drainage. The right medial foot wound appears to be closed. The right dorsal lateral foot wound also has a fair amount of slough but it is less tender than at our last visit. He still does not have an appointment with dermatology.  03/22/2022: The right anterior tibial wound has closed. The right lateral foot wound is nearly closed with just a little bit of slough. The left dorsal foot wound is smaller and continues to have some slough buildup but less so than at prior visits. There is good granulation tissue underlying the slough. Unfortunately the right medial calf wound continues to deteriorate. It has a foul odor today and a lot of necrotic tissue, the surrounding skin is also erythematous and moist. 03/30/2022: The right lateral foot wound continues to contract and just has a little bit of slough and eschar present. The left dorsal foot wound is also smaller with slough overlying good granulation tissue. The right medial calf wound actually looks quite a bit better today; there is no odor and there is much less necrotic tissue although some remains present. We have been using topical gentamicin and he has been taking oral Augmentin. 04/07/2022: The patient saw dermatology at [facility] last week. Unfortunately, it appears that they did not read any of the documentation that was provided. 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 Overall, it was a highly  unsatisfactory consultation. In the meantime, however, we were able to identify compounding pharmacy who could create a topical sodium thiosulfate ointment. The patient has that with him today. Both of the right foot wounds are now closed. The left dorsal foot wound has contracted but still has a layer of slough on the surface. The medial calf wounds on the right all look a little bit better. They still have heavy slough along with the chunks of calcium. Everything is stained purple. 04/14/2022: The wound on his dorsal left foot is a little bit smaller again today. There is still slough accumulation on the surface. The medial calf wounds have quite a bit less slough accumulation today. His right leg, however, is warm and erythematous, concerning for cellulitis. 04/14/2022: He completed his course of Augmentin yesterday. The medial calf wound sites are much cleaner today and shallower. There is less fragmented calcium in the wounds. He has a lot of lymphedema fluid-like drainage from these wounds, but no purulent discharge or malodor. The wound on his dorsal left foot has also contracted and is shallower. There is a layer of slough over healthy granulation tissue. 05/05/2022: The left dorsal foot wound is smaller today with less slough and more granulation tissue. The right medial calf wounds are shallower, but have extensive slough and some fat necrosis. The drainage has diminished considerably, however. 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 05/12/2022: The left dorsal foot wound continues to contract and fill with granulation tissue. There is slough on the wound surface. The right medial calf wounds also continue to fill with healthy tissue, but there is remaining slough and fat necrosis present. He had a significant increase in his drainage this week and it was a bright  yellow-green color. He also had a new wound open over his right anterior tibial surface associated with the spike of cutaneous calcium. The leg is more red, as well, concerning for infection. 05/19/2022: His culture returned positive for Pseudomonas. He is currently taking a course of oral levofloxacin.  We have also been using topical gentamicin mixed in with his sodium thiosulfate. All of the wounds look better this week. 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 There is slough and nonviable subcutaneous tissue still present. The new anterior tibial wound just has a light layer of slough. The dorsal foot wound also has some slough present and appears a little dry today. 05/26/2022: The right medial leg wound continues to improve. It is feeling with granulation tissue, but still accumulates a fairly thick layer of slough on the surface. The more recent anterior tibial wound has remained quite small, but also has some slough on the surface. The left dorsal foot wound has contracted somewhat and has perimeter epithelialization. He completed his oral levofloxacin. 06/02/2022: The left dorsal foot wound continues to improve significantly. There is just a little slough on the wound surface. The right medial leg wound had black drainage, but it looks like silver alginate was applied last week and I theorized that silver sulfide was created with a combination of the silver alginate and the sodium thiosulfate. It did, however, have a bit of an odor to it and extensive slough accumulation. The small anterior tibial wound also had a bit of slough and nonviable subcutaneous tissue present. The skin of his leg was rather macerated, as well. 06/09/2022: The left dorsal foot wound is smaller again this week with just a light layer of slough on the surface. The right medial leg wound looks substantially better. The leg and periwound skin are no longer red and macerated. There is good  granulation tissue forming with less slough and nonviable tissue. The anterior tibial wound just has a small amount of slough accumulation. 06/16/2022: The left dorsal foot wound continues to contract. His wrap slipped a little bit, however, and his foot is more swollen. The right medial leg wound continues to improve. The underlying tissue is more vital-appearing and there is less slough. He does have substantial drainage. The small anterior tibial wound has a bit of slough accumulation. 06/23/2022: The left dorsal foot wound is about half the size as it was last week. There is just some light slough on the surface and some eschar buildup around the edges. The right anterior leg wound is small with a little slough on the surface. Unfortunately, the right medial leg wound deteriorated fairly substantially. It is malodorous with gray/green/black discoloration. 06/30/2022: The left dorsal foot wound continues to contract. It is nearly closed with just a light layer of slough present. The right anterior leg wound is about the same size and has a small amount of slough on the surface. The right medial leg wound looks a lot better than it did last week. The odor has abated and the tissue is less pulpy and necrotic-looking. There is still a fairly thick layer of slough present. 12/13; the left dorsal foot wound looks very healthy. There was no need to change the dressing here and no debridement. He has a small area on the right anterior lower leg apparently had not changed much in size. The area on the medial leg is the most problematic area this is large with some depth and a completely nonviable surface today. 07/14/2022: The left dorsal foot wound is smaller today with just a little slough and eschar present. The right anterior lower leg wound is about the same without any significant change. The medial leg wound has a black and green surface and is malodorous. No purulent drainage and the periwound  skin  is intact. Edema control is suboptimal. 07/21/2022: The dorsal foot wound continues to contract and is nearly closed with just a little bit of slough and eschar on the surface. The right anterior lower leg wound is shallower today but otherwise the dimensions are unchanged. The medial leg wound looks significantly better although it still has a nonviable surface; the odor and black/green surface has resolved. His culture came back with multiple species sensitive to Augmentin, which was prescribed and a very low level of Pseudomonas, which should be handled by the topical gentamicin we have been using. 07/28/2022: The dorsal foot wound is down to just a very tiny opening with a little eschar on the surface. The right anterior tibial wound is about the same with some slough buildup. The right medial leg wound looks quite a bit better this week. There is thick rubbery slough on the surface, but the odor and drainage has improved significantly. 08/04/2022: The dorsal foot wound is almost healed, but the periwound skin has broken down. This appears to be secondary to moisture and adhesive from the absorbent pad. The right anterior tibial wound is shallower. The right medial leg wound continues to improve. It is smaller, still with rubbery slough on the surface. No malodor or purulent drainage. 08/11/2022: The dorsal foot wound is about the same size. It is a little bit dry with some slough accumulation. The right anterior tibial wound is also unchanged. The right medial leg wound is filling in further with good granulation tissue. Still with some slough buildup on the surface. DODGE, FRIEDRICHS ([id number]) [id number] Page 4 of 16 08/18/2022: His left leg is bright red, hot, and swollen with cellulitis. The dorsal foot wound actually looks quite good and is superficial with just a little bit of slough accumulation. The right anterior tibial wound is unchanged. The right medial leg wound  has filled in with good granulation tissue but has copious amounts of drainage. 08/25/2022: His cellulitis has responded nicely to the oral antibiotics. His dorsal foot wound is smaller but has a fair amount of slough buildup. The right anterior tibial wound remains stable and unchanged. The right medial leg wound has more granulation tissue under a layer of slough, but continues to have copious drainage. The drainage is actually causing skin irritation and threatens to result in breakdown. 09/01/2022: He continues to have profuse drainage from his wounds which is resulting in tissue maceration on both the dorsum of his left foot and the periwound distal to his medial leg wound. The dorsal foot wound is nearly healed, despite this. The medial right leg wound is filling in with good granulation tissue. We are working on getting home health assistance to change his dressings more frequently to try and control the drainage better. 09/08/2022: We have had success in controlling his drainage. The tissue maceration has improved significantly and his swelling has come down quite markedly. The medial right leg wound is much more superficial and has substantially less nonviable tissue present. The anterior tibial wound is a little bit larger, however with some nonviable tissue present. 09/15/2022: The dorsal foot wound is nearly closed. The anterior tibial wound measures slightly larger today. The medial right leg wound continues to improve quite dramatically with less nonviable tissue and more robust-looking granulation. Edema control is significantly better. 09/22/2022: The left dorsal foot wound remains open, albeit quite tiny. The anterior tibial wound is a little bit larger again today. The medial right leg wound continues to improve with an increase  in robust and healthy granulation tissue and less accumulation of nonviable tissue. 09/29/2022: His home health providers wrapped his left leg extremely tightly and  he ended up having to cut the wrap off of his foot. This resulted in his foot being very swollen and that the dorsal foot wound is a little bit larger today. The anterior tibial wound is about the same size with a little slough on the surface. The medial right leg wound continues to fill in. There are very few areas with any significant depth. There is still slough accumulation. 3/13; Patient has a left dorsal foot wound along with a right anterior tibial wound and right medial wound. We are using silver alginate to the left dorsal foot wound, Santyl and sodium thiosulfate compound ointment to the right lower extremity wounds under 4 layer compression. Patient has no issues or complaints today. 10/13/2022: The left dorsal foot wound is a little bit bigger today, but his foot is also a little bit more swollen. The right medial leg wound has filled in more and has substantially less depth. There is still some nonviable tissue present. The small anterior lower leg wound looks about the same. 10/20/2022: The left dorsal foot wound is epithelializing. There does not appear to be much open at this site. There is some eschar around the edges. The right medial leg wound continues to fill in with granulation tissue. The small anterior lower leg wound is a little bit bigger and there is a chunk of calcium protruding. He continues to have fairly copious drainage from the right leg. 10/27/2022: His home health agency failed to show up at all this past week and as a result, the excessive drainage from his right leg wound has resulted in tissue breakdown. His leg is red and irritated. The wounds themselves look about the same. The wound on his left dorsal foot is nearly closed underneath some eschar. 11/03/2022: We continue to have difficulty with his home health agency. He continues to have significant drainage from his right leg wound which has resulted in even further tissue breakdown. He also was not taking his  Lasix as prescribed and I think much of the drainage has been his lymphedema fluid choosing the path of least resistance. The wound on his left dorsal foot is closed, but in the process of cleaning the site, dry skin was pulled off and he has a new small superficial wound just adjacent to where the dorsal foot wound was. 11/12/2022: His left dorsal foot is completely healed. The right medial leg continues to deteriorate. He is not taking his Lasix as prescribed, nor is he using his lymphedema pumps at all. As a result, he has massive amounts of drainage saturating his dressings and causing further tissue breakdown on his medial leg. Today, his dressings had a foul odor, as well. 11/19/2022: His right medial leg looks even worse today. There is more erythema and moisture related tissue breakdown. The 2 anterior wounds look a little bit better. The culture that I took last week did produce Pseudomonas and he is currently taking levofloxacin for this. 11/26/2022: There has been significant improvement to his right medial leg. It is far less raw and many of the superficial open areas have epithelialized. He continues to cut the foot portion of his wrap and as result his foot is bulbous and swollen compared to the rest of his leg, where edema control is good. The smaller of the 2 anterior tibial wound is closed, while the larger  is contracting somewhat with a bit of slough at the base. 12/03/2022: His leg continues to demonstrate improvement. There has been epithelialization of much of the denuded area. He has a new small anterior tibial wound that has opened around a nidus of calcium. Edema control is improved. 12/10/2022: Continued improvement in the periwound skin. The anterior tibial wounds are about the same, but the medial leg wound has improved further. There is less nonviable tissue present. 12/17/2022: Between his visit on Tuesday with the nurse and today, he has had significantly more drainage which has  resulted in some periwound tissue maceration. I suspect this correlates somewhat with his diuretic use, as he says he takes his diuretic religiously on Saturday, Sunday, and Monday and then does not take it after Tuesday due to being out and about and not being close to easy restroom access. I suspect the excess fluid is just simply draining out of his leg. 12/21/2022: This was supposed to be a nurse visit, but his leg has broken down so significantly, that we have converted to a wound care visit. He did take his diuretics over the weekend, but despite this, he has had copious drainage and all of his tissues have broken down substantially from the right medial leg ulcer all the way down to his ankle and even wrapping posterior to this. There is blue-green staining on his dressing, suggesting ongoing presence of Pseudomonas aeruginosa. 12/24/2022: The wound already looks better than it did 2 days ago. He has been taking ciprofloxacin and the Urgo clean dressing we applied did a much better job of wicking the drainage away from his leg. There is slough on the entire surface of the medial leg wound, but the erythema and induration has improved in the past couple of days. 12/31/2022: Continued improvement of the wound. He says that the pain has basically abated since being on ciprofloxacin. Still with fairly heavy drainage and slough accumulation. 01/07/2023: Overall continued improvement. Still with heavy slough buildup, but less drainage. 01/14/2023: There is a little bit of improvement. The skin is still looking a bit excoriated. He has a few more days left of ciprofloxacin. 01/21/2023: There has been significant improvement. The periwound skin is in much better condition. The ulcers are shallower and actually have some good granulation tissue present under some slough. 01/28/2023: The wound continues to improve. All of the open areas measured smaller today. There is slough accumulation on the surfaces. His  drainage has decreased markedly. 02/04/2023: Continued improvement in the wound. Drainage remains significantly diminished. Slough accumulation is present but to a lesser extent. ROSAIRE, [name] ([id number]) [id number] Page 5 of 16 skin continues to improve. Edema control is good. 02/18/2023: The heavy drainage persists, although it is was not as significant today as it was at his nurse visit on Tuesday. The drainage is yellow and does not have the blue-green discoloration suggestive of Pseudomonas. The actual wounds on his leg are getting smaller, but the periwound skin is quite excoriated. 02/25/2023: His periwound skin is quite excoriated and he has a lot of clear yellow drainage. The main wounds are filling in further and epithelializing. 03/04/2023: The excoriation of the periwound skin has improved. Most of that area has dried up. The main wounds have less slough in them today. 03/11/2023: The main wounds have accumulated a little bit of slough. The periwound excoriation continues to improve. Unfortunately, he suffered a fall over the previous weekend and has abrasions on his knees bilaterally and on his right wrist  and upper arm. In addition, the skin on his left lower leg apparently blistered and opened, leaving several superficial wounds on this extremity. He has been approved for the [facility] wound VAC and has the equipment with him today. 03/18/2023: He has extensive excoriation to the periwound skin on the right medial lower leg. The actual wounds themselves are smaller with slough accumulation. The top of his left foot has begun weeping again. Using the [facility] wound VAC, he says that he through 4 canisters. He seems to be very fluid overloaded, without any significant dyspnea. 03/25/2023: The wound to his left anterior tibial surface has healed. The left dorsal foot wound has gotten smaller and has a small area of hypertrophic granulation tissue  present. The periwound skin on the right medial lower leg looks slightly better, but still appears excoriated. The actual wounds continue to contract but are still accumulating quite a bit of slough. The tiny right anterior leg wounds are contracting. Electronic Signature(s) Signed: 03/25/2023 11:40:27 AM By: Duanne Guess MD FACS Entered By: Duanne Guess on 03/25/2023 08:40:27 -------------------------------------------------------------------------------- Chemical Cauterization Details Patient Name: Date of Service: [name], MCFARLEN 03/25/2023 11:00 A M Medical Record Number: [id number] Patient Account Number: [id number] Date of Birth/Sex: Treating RN: Oct 15, 1950 (72 y.o. [name] [name] Primary Care Provider: [name] Other Clinician: Referring Provider: Treating Provider/Extender: [name] in Treatment: 58 Procedure Performed for: Wound #8 Left,Dorsal Foot Performed By: Physician Duanne Guess, MD Post Procedure Diagnosis Same as Pre-procedure Notes used silver nitrate stick Electronic Signature(s) Signed: 03/25/2023 12:16:34 PM By: [name] Deed RN, BSN Signed: 03/25/2023 12:18:18 PM By: Duanne Guess MD FACS Entered By: [name] [name] on 03/25/2023 08:29:57 -------------------------------------------------------------------------------- Physical Exam Details Patient Name: Date of Service: [name], [name] 03/25/2023 11:00 A M Medical Record Number: [id number] Patient Account Number: [id number] Date of Birth/Sex: Treating RN: 1950-12-22 (72 y.o. M) Primary Care Provider: [name] Other Clinician: Referring Provider: Treating Provider/Extender: [name] in Treatment: 65 Constitutional . . . . no acute distress. DEANTONIO, TIMS ([id number]) [id number] Page 6 of 16 Respiratory Normal work of breathing on room air. Notes 03/25/2023: The wound to his left anterior tibial  surface has healed. The left dorsal foot wound has gotten smaller and has a small area of hypertrophic granulation tissue present. The periwound skin on the right medial lower leg looks slightly better, but still appears excoriated. The actual wounds continue to contract but are still accumulating quite a bit of slough. The tiny right anterior leg wounds are contracting. Electronic Signature(s) Signed: 03/25/2023 11:41:54 AM By: Duanne Guess MD FACS Entered By: Duanne Guess on 03/25/2023 08:41:54 -------------------------------------------------------------------------------- Physician Orders Details Patient Name: Date of Service: MADDOC, [name] [name] 03/25/2023 11:00 A M Medical Record Number: [id number] Patient Account Number: [id number] Date of Birth/Sex: Treating RN: Sep 03, 1950 (72 y.o. 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 - [name] [name] RM 1 Return A Friday 9.6 @ 10:15 am Nurse Visit: - Tues. 9/3 @ 3:30 pm Anesthetic (In clinic) Topical Lidocaine 4% applied to wound bed Bathing/ Shower/ Hygiene May shower with protection but do not get wound  dressing(s) wet. Protect dressing(s) with water repellant cover (for example, large plastic bag) or a cast cover and may then take shower. Negative Presssure Wound Therapy Wound Vac to wound continuously at 128mm/hg pressure Black Foam Edema Control - Lymphedema / SCD / Other Lymphedema Pumps. Use Lymphedema pumps on leg(s) 2-3 times a day for 45-60 minutes. If wearing any wraps or hose, do not remove them. Continue exercising as instructed. Elevate legs to the level of the heart or above for 30 minutes daily and/or when sitting for 3-4 times a day throughout the day. - throughout the day Avoid standing for long periods of time. Wound Treatment Wound #3 - Lower Leg Wound Laterality: Right, Medial Peri-Wound Care: Skin Prep 1 x Per Week/30 Days Discharge Instructions: cavelon to macerated wound margins Peri-Wound Care: Sween Lotion (Moisturizing lotion) 1 x Per Week/30 Days Discharge Instructions: Apply moisturizing lotion as directed OCIE, [name] ([id number]) [id number] Page 7 of 16 Prim Dressing: Maxorb Extra Ag+ Alginate Dressing, 4x4.75 (in/in) 1 x Per Week/30 Days ary Discharge Instructions: Apply toexcoriated weeping areas Prim Dressing: peel and place vac ary 1 x Per Week/30 Days Compression Wrap: ThreePress (3 layer compression wrap) 1 x Per Week/30 Days Discharge Instructions: Apply three layer compression as directed. Wound #6 - Lower Leg Wound Laterality: Right, Anterior Peri-Wound Care: Sween Lotion (Moisturizing lotion) 1 x Per Week/30 Days Discharge Instructions: Apply moisturizing lotion as directed Prim Dressing: peel and place vac ary 1 x Per Week/30 Days Compression Wrap: ThreePress (3 layer compression wrap) 1 x Per Week/30 Days Discharge Instructions: Apply three layer compression as directed. Wound #8 - Foot Wound Laterality: Dorsal, Left Cleanser: Soap and Water 1 x Per Week/30 Days Discharge Instructions: May shower and wash wound  with dial antibacterial soap and water prior to dressing change. Peri-Wound Care: Sween Lotion (Moisturizing lotion) 1 x Per Week/30 Days Discharge Instructions: Apply moisturizing lotion as directed Prim Dressing: Maxorb Extra Ag+ Alginate Dressing, 2x2 (in/in) 1 x Per Week/30 Days ary Discharge Instructions: Apply to wound bed as instructed Secondary Dressing: Woven Gauze Sponge, Non-Sterile 4x4 in 1 x Per Week/30 Days Discharge Instructions: Apply over primary dressing as directed. Compression Wrap: ThreePress (3 layer compression wrap) 1 x Per Week/30 Days Discharge Instructions: Apply three layer compression as directed. Electronic Signature(s) Signed: 03/25/2023 12:18:18 PM By: Duanne Guess MD FACS Entered By: Duanne Guess on 03/25/2023 08:43:13 -------------------------------------------------------------------------------- Problem List Details Patient Name: Date of Service: JAETHAN, DUEL [name] 03/25/2023 11:00 A M Medical Record Number: [id number] Patient Account Number: [id number] Date of Birth/Sex: Treating RN: 1951/04/16 (72 y.o. [name] [name] Primary Care Provider: [name] Other Clinician: Referring Provider: Treating Provider/Extender: [name] in Treatment: 11 Active Problems ICD-10 Encounter Code Description Active Date MDM Diagnosis L97.812 Non-pressure chronic ulcer of other part of right lower leg with fat layer 02/09/2022 No Yes exposed Z86.718 Personal history of other venous thrombosis and embolism 02/09/2022 No Yes L97.521 Non-pressure chronic ulcer of other part of left foot limited to breakdown of 03/18/2023 No Yes skin BILLYRAY, GRUDZIEN ([id number]) [id number] Page 8 of 16 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 02/09/2022 No Yes 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) 02/09/2022 No Yes I89.0 Lymphedema, not elsewhere classified  02/09/2022 No Yes I10 Essential (primary) hypertension 02/09/2022 No  Yes I25.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 angina pectoris 02/09/2022 No Yes Inactive Problems ICD-10 Code Description Active Date Inactive Date L97.512 Non-pressure chronic ulcer of other part of right foot with fat layer exposed 02/17/2022 02/17/2022 Resolved Problems ICD-10 Code Description Active Date Resolved Date L98.492 Non-pressure chronic ulcer of skin of other sites with fat layer exposed 03/11/2023 03/11/2023 L97.821 Non-pressure chronic ulcer of other part of left lower leg limited to breakdown of skin 03/11/2023 03/11/2023 Electronic Signature(s) Signed: 03/25/2023 11:38:20 AM By: Duanne Guess MD FACS Previous Signature: 03/25/2023 11:20:44 AM Version By: Duanne Guess MD FACS Entered By: Duanne Guess on 03/25/2023 08:38:20 -------------------------------------------------------------------------------- Progress Note Details Patient Name: Date of Service: [name] [name] 03/25/2023 11:00 A M Medical Record Number: [id number] Patient Account Number: [id number] Date of Birth/Sex: Treating RN: 07/12/1951 (72 y.o. M) Primary Care Provider: [name] Other Clinician: Referring Provider: Treating Provider/Extender: [name] in Treatment: 16 Subjective Chief Complaint Information obtained from Patient Patient seen for complaints of Non-Healing Wounds. History of Present Illness (HPI) ADMISSION 02/09/2022 This is a 72 year old man who recently moved to [place] from [place]. He has a history of of coronary artery disease and prior left popliteal vein DVT. He is not diabetic and does not smoke. 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 He has had multiple spinal surgeries and has limited mobility. He primarily uses a motorized wheelchair. He has had lymphedema for many years and does have lymphedema pumps although he says he does not use OMIR, GRIFFIN ([id number])  [id number] Page 9 of 16 them frequently. He has wounds on his bilateral lower extremities. He was receiving care in [place] for these prior to his move. They were apparently packing his wounds with Betadine soaked gauze. He has worn compression wraps in the past but not recently. He did say that they helped tremendously. In clinic today, his right ABI is noncompressible but has good signals; the left ABI was 1.17. No formal vascular studies have been performed. On his left dorsal foot, there is a circular lesion that exposes the fat layer. There is fairly heavy slough accumulation within the wound, but no significant odor. On his right medial calf, he has multiple pinholes that have slough buildup in them and probe for about 2 to 4 mm in depth. They are draining clear fluid. On his right lateral midfoot, he has ulceration consistent with venous stasis. It is geographic and has slough accumulation. 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 02/17/2022: All of the wounds are little bit larger today and have more slough accumulation. Today, the medial calf openings are larger and probe to something that feels calcified. There is odor coming from his wounds. His vascular studies are scheduled for August 9 and 10. 02-24-2022 upon evaluation today patient presents for follow-up concerning his ongoing issues here with his lower extremities. With that being said he does have significant problems with what appears to be cellulitis unfortunately the PCR culture that was sent last week got crushed by [employer] in transit. With that being said we are going to reobtain a culture today although I am actually to do it on the right medial leg as this seems to be the most inflamed area today where there is some questionable drainage as well. I am going to remove some of the calcium  deposits which are loosening  obtain a deeper culture from this area. Fortunately I do not see any evidence of active infection systemically which is good news but locally I think we are having a bigger issue here. He does have his appointment next Thursday with vascular. Patient has also been referred to [facility] for further evaluation and treatment of the calcinosis for possible sulfate injections. 03/04/2022: The culture that was performed last week grew out multiple species including Klebsiella, Morganella, and Pseudomonas aeruginosa. He had been prescribed Bactrim but this was not adequate to cover all of the species and levofloxacin was recommended. He completed the Bactrim but did not pick up the levofloxacin and begin taking it until yesterday. 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 He had arterial studies performed today. He does have evidence of peripheral arterial disease. Results copied here: +-------+-----------+-----------+------------+------------+ ABI/TBIT oday's ABIT oday's TBIPrevious ABIPrevious TBI +-------+-----------+-----------+------------+------------+ Right [place] 0.75    +-------+-----------+-----------+------------+------------+ Left [place] 0.57    +-------+-----------+-----------+------------+------------+ Arterial wall calcification precludes accurate ankle pressures and ABIs. Summary: Right: Resting right ankle-brachial index indicates noncompressible right lower extremity arteries. The right toe-brachial index is normal. Left: Resting left ankle-brachial index indicates noncompressible left lower extremity arteries. The left toe-brachial index is abnormal. He continues to have further tissue breakdown at the right medial calf and there is ample slough accumulation along with necrotic subcutaneous tissue. The  left dorsal foot wound has built up slough, as well. The right medial foot wound is nearly closed with just a light layer of eschar over the surface. The right dorsal lateral foot wound has also accumulated a fair amount of slough and remains quite tender. 03/10/2022: He has been taking the prescribed levofloxacin and has about 3 days left of this. The erythema and induration on his right leg has improved. He continues to experience further breakdown of the right medial calf wound. There is extensive slough and debris accumulation there. There is heavy slough on his left dorsal foot wound, but no odor or purulent drainage. The right medial foot wound appears to be closed. The right dorsal lateral foot wound also has a fair amount of slough but it is less tender than at our last visit. He still does not have an appointment with dermatology. 03/22/2022: The right anterior tibial wound has closed. The right lateral foot wound is nearly closed with just a little bit of slough. The left dorsal foot wound is smaller and continues to have some slough buildup but less so than at prior visits. There is good granulation tissue underlying the slough. Unfortunately the right medial calf wound continues to deteriorate. It has a foul odor today and a lot of necrotic tissue, the surrounding skin is also erythematous and moist. 03/30/2022: The right lateral foot wound continues to contract and just has a little bit of slough and eschar present. The left dorsal foot wound is also smaller with slough overlying good granulation tissue. The right medial calf wound actually looks quite a bit better today; there is no odor and there is much less necrotic tissue although some remains present. We have been using topical gentamicin and he has been taking oral Augmentin. 04/07/2022: The patient saw dermatology at [facility] last week. Unfortunately, it appears that they did not read any of the documentation  that was provided. 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 Overall, it was a highly unsatisfactory consultation. In the meantime, however, we were able to identify compounding pharmacy who could create a topical sodium thiosulfate ointment. The patient has that with him today. Both of the right foot wounds are now closed. The left dorsal foot wound has contracted but still has a layer of slough on the surface. The medial calf wounds on the right all look a little bit better. They still have heavy slough along with the chunks of calcium. Everything is stained purple. 04/14/2022: The wound on his dorsal left foot is a little bit smaller again today. There is still slough accumulation on the surface. The medial calf wounds have quite a bit less slough accumulation today. His right leg, however, is warm and erythematous, concerning for cellulitis. 04/14/2022: He completed his course of Augmentin yesterday. The medial calf wound sites are much cleaner today and shallower. There is less fragmented calcium in the wounds. He has a lot of lymphedema fluid-like drainage from these wounds, but no purulent discharge or malodor. The wound on his dorsal left foot has also contracted and is shallower. There is a layer of slough over healthy granulation tissue. 05/05/2022: The left dorsal foot wound is smaller today with less slough and more granulation tissue. The right medial calf wounds are shallower, but have extensive slough and some fat necrosis. The drainage has diminished considerably, however. 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 05/12/2022:  The left dorsal foot wound continues to contract and fill with granulation tissue. There is slough on the wound surface. The right medial calf wounds also continue to fill with healthy tissue, but there is remaining slough and fat necrosis present. He had a significant increase in his drainage this week and it was a bright yellow-green color. He also had a new wound open over his right anterior tibial surface associated with the spike of cutaneous calcium. The leg is more red, as well, concerning for infection. 05/19/2022: His culture returned positive for Pseudomonas. He is currently taking a course of oral levofloxacin. We have also been using topical gentamicin mixed in with his sodium thiosulfate. All of the wounds look better this week. 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 There is slough and nonviable subcutaneous tissue still present. The new anterior tibial wound just has a light layer of slough. The dorsal foot wound also has some slough present and appears a little dry today. [name], MAKRIS ([id number]) [id number] Page 10 of 16 05/26/2022: The right medial leg wound continues to improve. It is feeling with granulation tissue, but still accumulates a fairly thick layer of slough on the surface. The more recent anterior tibial wound has remained quite small, but also has some slough on the surface. The left dorsal foot wound has contracted somewhat and has perimeter epithelialization. He completed his oral levofloxacin. 06/02/2022: The left dorsal foot wound continues to improve significantly. There is just a little slough on the wound surface. The right medial leg wound had black drainage, but it looks like silver alginate was applied last week and I theorized that silver sulfide was created with a combination of the  silver alginate and the sodium thiosulfate. It did, however, have a bit of an odor to it and extensive slough  accumulation. The small anterior tibial wound also had a bit of slough and nonviable subcutaneous tissue present. The skin of his leg was rather macerated, as well. 06/09/2022: The left dorsal foot wound is smaller again this week with just a light layer of slough on the surface. The right medial leg wound looks substantially better. The leg and periwound skin are no longer red and macerated. There is good granulation tissue forming with less slough and nonviable tissue. The anterior tibial wound just has a small amount of slough accumulation. 06/16/2022: The left dorsal foot wound continues to contract. His wrap slipped a little bit, however, and his foot is more swollen. The right medial leg wound continues to improve. The underlying tissue is more vital-appearing and there is less slough. He does have substantial drainage. The small anterior tibial wound has a bit of slough accumulation. 06/23/2022: The left dorsal foot wound is about half the size as it was last week. There is just some light slough on the surface and some eschar buildup around the edges. The right anterior leg wound is small with a little slough on the surface. Unfortunately, the right medial leg wound deteriorated fairly substantially. It is malodorous with gray/green/black discoloration. 06/30/2022: The left dorsal foot wound continues to contract. It is nearly closed with just a light layer of slough present. The right anterior leg wound is about the same size and has a small amount of slough on the surface. The right medial leg wound looks a lot better than it did last week. The odor has abated and the tissue is less pulpy and necrotic-looking. There is still a fairly thick layer of slough present. 12/13; the left dorsal foot wound looks very healthy. There was no need to change the dressing here and no debridement. He has a small area on the right anterior lower leg apparently had not changed much in size. The area on the  medial leg is the most problematic area this is large with some depth and a completely nonviable surface today. 07/14/2022: The left dorsal foot wound is smaller today with just a little slough and eschar present. The right anterior lower leg wound is about the same without any significant change. The medial leg wound has a black and green surface and is malodorous. No purulent drainage and the periwound skin is intact. Edema control is suboptimal. 07/21/2022: The dorsal foot wound continues to contract and is nearly closed with just a little bit of slough and eschar on the surface. The right anterior lower leg wound is shallower today but otherwise the dimensions are unchanged. The medial leg wound looks significantly better although it still has a nonviable surface; the odor and black/green surface has resolved. His culture came back with multiple species sensitive to Augmentin, which was prescribed and a very low level of Pseudomonas, which should be handled by the topical gentamicin we have been using. 07/28/2022: The dorsal foot wound is down to just a very tiny opening with a little eschar on the surface. The right anterior tibial wound is about the same with some slough buildup. The right medial leg wound looks quite a bit better this week. There is thick rubbery slough on the surface, but the odor and drainage has improved significantly. 08/04/2022: The dorsal foot wound is almost healed, but the periwound skin has broken down. This appears to be secondary  to moisture and adhesive from the absorbent pad. The right anterior tibial wound is shallower. The right medial leg wound continues to improve. It is smaller, still with rubbery slough on the surface. No malodor or purulent drainage. 08/11/2022: The dorsal foot wound is about the same size. It is a little bit dry with some slough accumulation. The right anterior tibial wound is also unchanged. The right medial leg wound is filling in further  with good granulation tissue. Still with some slough buildup on the surface. 08/18/2022: His left leg is bright red, hot, and swollen with cellulitis. The dorsal foot wound actually looks quite good and is superficial with just a little bit of slough accumulation. The right anterior tibial wound is unchanged. The right medial leg wound has filled in with good granulation tissue but has copious amounts of drainage. 08/25/2022: His cellulitis has responded nicely to the oral antibiotics. His dorsal foot wound is smaller but has a fair amount of slough buildup. The right anterior tibial wound remains stable and unchanged. The right medial leg wound has more granulation tissue under a layer of slough, but continues to have copious drainage. The drainage is actually causing skin irritation and threatens to result in breakdown. 09/01/2022: He continues to have profuse drainage from his wounds which is resulting in tissue maceration on both the dorsum of his left foot and the periwound distal to his medial leg wound. The dorsal foot wound is nearly healed, despite this. The medial right leg wound is filling in with good granulation tissue. We are working on getting home health assistance to change his dressings more frequently to try and control the drainage better. 09/08/2022: We have had success in controlling his drainage. The tissue maceration has improved significantly and his swelling has come down quite markedly. The medial right leg wound is much more superficial and has substantially less nonviable tissue present. The anterior tibial wound is a little bit larger, however with some nonviable tissue present. 09/15/2022: The dorsal foot wound is nearly closed. The anterior tibial wound measures slightly larger today. The medial right leg wound continues to improve quite dramatically with less nonviable tissue and more robust-looking granulation. Edema control is significantly better. 09/22/2022: The left  dorsal foot wound remains open, albeit quite tiny. The anterior tibial wound is a little bit larger again today. The medial right leg wound continues to improve with an increase in robust and healthy granulation tissue and less accumulation of nonviable tissue. 09/29/2022: His home health providers wrapped his left leg extremely tightly and he ended up having to cut the wrap off of his foot. This resulted in his foot being very swollen and that the dorsal foot wound is a little bit larger today. The anterior tibial wound is about the same size with a little slough on the surface. The medial right leg wound continues to fill in. There are very few areas with any significant depth. There is still slough accumulation. 3/13; Patient has a left dorsal foot wound along with a right anterior tibial wound and right medial wound. We are using silver alginate to the left dorsal foot wound, Santyl and sodium thiosulfate compound ointment to the right lower extremity wounds under 4 layer compression. Patient has no issues or complaints today. 10/13/2022: The left dorsal foot wound is a little bit bigger today, but his foot is also a little bit more swollen. The right medial leg wound has filled in more and has substantially less depth. There is still some  nonviable tissue present. The small anterior lower leg wound looks about the same. 10/20/2022: The left dorsal foot wound is epithelializing. There does not appear to be much open at this site. There is some eschar around the edges. The right medial leg wound continues to fill in with granulation tissue. The small anterior lower leg wound is a little bit bigger and there is a chunk of calcium protruding. He continues to have fairly copious drainage from the right leg. 10/27/2022: His home health agency failed to show up at all this past week and as a result, the excessive drainage from his right leg wound has resulted in tissue breakdown. His leg is red and irritated.  The wounds themselves look about the same. The wound on his left dorsal foot is nearly closed underneath some eschar. 11/03/2022: We continue to have difficulty with his home health agency. He continues to have significant drainage from his right leg wound which has resulted WEI, [name] ([id number]) [id number] Page 11 of 16 in even further tissue breakdown. He also was not taking his Lasix as prescribed and I think much of the drainage has been his lymphedema fluid choosing the path of least resistance. The wound on his left dorsal foot is closed, but in the process of cleaning the site, dry skin was pulled off and he has a new small superficial wound just adjacent to where the dorsal foot wound was. 11/12/2022: His left dorsal foot is completely healed. The right medial leg continues to deteriorate. He is not taking his Lasix as prescribed, nor is he using his lymphedema pumps at all. As a result, he has massive amounts of drainage saturating his dressings and causing further tissue breakdown on his medial leg. Today, his dressings had a foul odor, as well. 11/19/2022: His right medial leg looks even worse today. There is more erythema and moisture related tissue breakdown. The 2 anterior wounds look a little bit better. The culture that I took last week did produce Pseudomonas and he is currently taking levofloxacin for this. 11/26/2022: There has been significant improvement to his right medial leg. It is far less raw and many of the superficial open areas have epithelialized. He continues to cut the foot portion of his wrap and as result his foot is bulbous and swollen compared to the rest of his leg, where edema control is good. The smaller of the 2 anterior tibial wound is closed, while the larger is contracting somewhat with a bit of slough at the base. 12/03/2022: His leg continues to demonstrate improvement. There has been epithelialization of much of the denuded area.  He has a new small anterior tibial wound that has opened around a nidus of calcium. Edema control is improved. 12/10/2022: Continued improvement in the periwound skin. The anterior tibial wounds are about the same, but the medial leg wound has improved further. There is less nonviable tissue present. 12/17/2022: Between his visit on Tuesday with the nurse and today, he has had significantly more drainage which has resulted in some periwound tissue maceration. I suspect this correlates somewhat with his diuretic use, as he says he takes his diuretic religiously on Saturday, Sunday, and Monday and then does not take it after Tuesday due to being out and about and not being close to easy restroom access. I suspect the excess fluid is just simply draining out of his leg. 12/21/2022: This was supposed to be a nurse visit, but his leg has broken down so significantly, that we have converted  to a wound care visit. He did take his diuretics over the weekend, but despite this, he has had copious drainage and all of his tissues have broken down substantially from the right medial leg ulcer all the way down to his ankle and even wrapping posterior to this. There is blue-green staining on his dressing, suggesting ongoing presence of Pseudomonas aeruginosa. 12/24/2022: The wound already looks better than it did 2 days ago. He has been taking ciprofloxacin and the Urgo clean dressing we applied did a much better job of wicking the drainage away from his leg. There is slough on the entire surface of the medial leg wound, but the erythema and induration has improved in the past couple of days. 12/31/2022: Continued improvement of the wound. He says that the pain has basically abated since being on ciprofloxacin. Still with fairly heavy drainage and slough accumulation. 01/07/2023: Overall continued improvement. Still with heavy slough buildup, but less drainage. 01/14/2023: There is a little bit of improvement. The skin  is still looking a bit excoriated. He has a few more days left of ciprofloxacin. 01/21/2023: There has been significant improvement. The periwound skin is in much better condition. The ulcers are shallower and actually have some good granulation tissue present under some slough. 01/28/2023: The wound continues to improve. All of the open areas measured smaller today. There is slough accumulation on the surfaces. His drainage has decreased markedly. 02/04/2023: Continued improvement in the wound. Drainage remains significantly diminished. Slough accumulation is present but to a lesser extent. Periwound skin continues to improve. Edema control is good. 02/18/2023: The heavy drainage persists, although it is was not as significant today as it was at his nurse visit on Tuesday. The drainage is yellow and does not have the blue-green discoloration suggestive of Pseudomonas. The actual wounds on his leg are getting smaller, but the periwound skin is quite excoriated. 02/25/2023: His periwound skin is quite excoriated and he has a lot of clear yellow drainage. The main wounds are filling in further and epithelializing. 03/04/2023: The excoriation of the periwound skin has improved. Most of that area has dried up. The main wounds have less slough in them today. 03/11/2023: The main wounds have accumulated a little bit of slough. The periwound excoriation continues to improve. Unfortunately, he suffered a fall over the previous weekend and has abrasions on his knees bilaterally and on his right wrist and upper arm. In addition, the skin on his left lower leg apparently blistered and opened, leaving several superficial wounds on this extremity. He has been approved for the [facility] wound VAC and has the equipment with him today. 03/18/2023: He has extensive excoriation to the periwound skin on the right medial lower leg. The actual wounds themselves are smaller with slough accumulation. The top of his left foot has  begun weeping again. Using the [facility] and Press wound VAC, he says that he through 4 canisters. He seems to be very fluid overloaded, without any significant dyspnea. 03/25/2023: The wound to his left anterior tibial surface has healed. The left dorsal foot wound has gotten smaller and has a small area of hypertrophic granulation tissue present. The periwound skin on the right medial lower leg looks slightly better, but still appears excoriated. The actual wounds continue to contract but are still accumulating quite a bit of slough. The tiny right anterior leg wounds are contracting. Patient History Information obtained from Patient, Chart. Family History Cancer - Mother, Heart Disease - Father,Mother, Hypertension - Father,Siblings, Lung  Disease - Siblings, No family history of Diabetes, Hereditary Spherocytosis, Kidney Disease, Seizures, Stroke, Thyroid Problems, Tuberculosis. Social History Former smoker - quit 1991, Marital Status - Married, Alcohol Use - Moderate, Drug Use - No History, Caffeine Use - Daily - coffee. Medical History Eyes Patient has history of Cataracts - right eye removed Denies history of Glaucoma, Optic Neuritis Ear/Nose/Mouth/Throat Denies history of Chronic sinus problems/congestion, Middle ear problems Hematologic/Lymphatic Patient has history of Anemia - macrocytic, Lymphedema Cardiovascular Patient has history of Congestive Heart Failure, Deep Vein Thrombosis - left leg, Hypertension, Peripheral Venous Disease [name], Wrangler ([id number]) [id number] Page 12 of 16 Endocrine Denies history of Type I Diabetes, Type II Diabetes Genitourinary Denies history of End Stage Renal Disease Integumentary (Skin) Denies history of History of Burn Oncologic Denies history of Received Chemotherapy, Received Radiation Psychiatric Denies history of Anorexia/bulimia, Confinement Anxiety Hospitalization/Surgery History - cervical fusion. - lumbar  surgery. - coronary stent placement. - lasik eye surgery. - hydrocele excision. Medical A Surgical History Notes nd Constitutional Symptoms (General Health) obesity Ear/Nose/Mouth/Throat hard of hearing Respiratory frozen diaphragm right Cardiovascular hyperlipidemia Musculoskeletal myositis, lumbar DDD, spinal stenosis, cervical facet joint syndrome Objective Constitutional no acute distress. Vitals Time Taken: 10:52 AM, Height: 71 in, Weight: 267 lbs, BMI: 37.2, Temperature: 97.9 F, Pulse: 62 bpm, Respiratory Rate: 18 breaths/min, Blood Pressure: 130/80 mmHg. Respiratory Normal work of breathing on room air. General Notes: 03/25/2023: The wound to his left anterior tibial surface has healed. The left dorsal foot wound has gotten smaller and has a small area of hypertrophic granulation tissue present. The periwound skin on the right medial lower leg looks slightly better, but still appears excoriated. The actual wounds continue to contract but are still accumulating quite a bit of slough. The tiny right anterior leg wounds are contracting. Integumentary (Hair, Skin) Wound #3 status is Open. Original cause of wound was Gradually Appeared. The date acquired was: 12/07/2021. The wound has been in treatment 58 weeks. The wound is located on the Right,Medial Lower Leg. The wound measures 9cm length x 5.5cm width x 0.3cm depth; 38.877cm^2 area and 11.663cm^3 volume. There is Fat Layer (Subcutaneous Tissue) exposed. There is no tunneling or undermining noted. There is a large amount of serous drainage noted. The wound margin is indistinct and nonvisible. There is medium (34-66%) red granulation within the wound bed. There is a medium (34-66%) amount of necrotic tissue within the wound bed including Adherent Slough. The periwound skin appearance had no abnormalities noted for moisture. The periwound skin appearance exhibited: Excoriation, Erythema. The surrounding wound skin color is noted with  erythema. Periwound temperature was noted as No Abnormality. Wound #6 status is Open. Original cause of wound was Gradually Appeared. The date acquired was: 05/12/2022. The wound has been in treatment 45 weeks. The wound is located on the Right,Anterior Lower Leg. The wound measures 1.4cm length x 0.2cm width x 0.1cm depth; 0.22cm^2 area and 0.022cm^3 volume. There is Fat Layer (Subcutaneous Tissue) exposed. There is no tunneling or undermining noted. There is a small amount of serous drainage noted. The wound margin is epibole. There is no granulation within the wound bed. There is no necrotic tissue within the wound bed. The periwound skin appearance had no abnormalities noted for texture. The periwound skin appearance had no abnormalities noted for moisture. The periwound skin appearance had no abnormalities noted for color. Periwound temperature was noted as No Abnormality. Wound #7 status is Healed - Epithelialized. Original cause of wound was Blister. The date acquired was:  03/09/2023. The wound has been in treatment 2 weeks. The wound is located on the Left,Anterior Lower Leg. The wound measures 0cm length x 0cm width x 0cm depth; 0cm^2 area and 0cm^3 volume. There is no tunneling or undermining noted. There is a none present amount of drainage noted. There is no granulation within the wound bed. There is no necrotic tissue within the wound bed. The periwound skin appearance had no abnormalities noted for texture. The periwound skin appearance had no abnormalities noted for moisture. The periwound skin appearance exhibited: Hemosiderin Staining. Periwound temperature was noted as No Abnormality. Wound #8 status is Open. Original cause of wound was Gradually Appeared. The date acquired was: 03/18/2023. The wound has been in treatment 1 weeks. The wound is located on the Left,Dorsal Foot. The wound measures 0.3cm length x 0.5cm width x 0.1cm depth; 0.118cm^2 area and 0.012cm^3 volume. There is Fat  Layer (Subcutaneous Tissue) exposed. There is no tunneling or undermining noted. There is a medium amount of serous drainage noted. The wound margin is flat and intact. There is large (67-100%) red granulation within the wound bed. There is a small (1-33%) amount of necrotic tissue within the wound bed including Adherent Slough. The periwound skin appearance had no abnormalities noted for texture. The periwound skin appearance had no abnormalities noted for moisture. The periwound skin appearance had no abnormalities noted for color. Periwound temperature was noted as No Abnormality. Assessment Active Problems ICD-10 Non-pressure chronic ulcer of other part of right lower leg with fat layer exposed Personal history of other venous thrombosis and embolism Non-pressure chronic ulcer of other part of left foot limited to breakdown of skin KASHIEF, NOVEMBER ([id number]) [id number] Page 13 of 16 Calcinosis cutis Venous insufficiency (chronic) (peripheral) Lymphedema, not elsewhere classified Essential (primary) hypertension Atherosclerotic heart disease of native coronary artery without angina pectoris Procedures Wound #3 Pre-procedure diagnosis of Wound #3 is a Lymphedema located on the Right,Medial Lower Leg . There was a Selective/Open Wound Non-Viable Tissue Debridement with a total area of 31.09 sq cm performed by Duanne Guess, MD. With the following instrument(s): Curette to remove Non-Viable tissue/material. Material removed includes [facility] after achieving pain control using Lidocaine 4% Topical Solution. No specimens were taken. A time out was conducted at 11:30, prior to the start of the procedure. A Minimum amount of bleeding was controlled with Pressure. The procedure was tolerated well with a pain level of 3 throughout and a pain level of 2 following the procedure. Post Debridement Measurements: 9cm length x 5.5cm width x 0.3cm depth; 11.663cm^3  volume. Character of Wound/Ulcer Post Debridement requires further debridement. Post procedure Diagnosis Wound #3: Same as Pre-Procedure General Notes: scribed for [name] [name] by [name] Deed, RN. Pre-procedure diagnosis of Wound #3 is a Lymphedema located on the Right,Medial Lower Leg . There was a Three Layer Compression Therapy Procedure by [name] Deed, RN. Post procedure Diagnosis Wound #3: Same as Pre-Procedure Wound #8 Pre-procedure diagnosis of Wound #8 is a Lymphedema located on the Left,Dorsal Foot . There was a Three Layer Compression Therapy Procedure by [name] Deed, RN. Post procedure Diagnosis Wound #8: Same as Pre-Procedure Pre-procedure diagnosis of Wound #8 is a Lymphedema located on the Left,Dorsal Foot . An Chemical Cauterization procedure was performed by Duanne Guess, MD. Post procedure Diagnosis Wound #8: Same as Pre-Procedure Notes: used silver nitrate stick Plan Follow-up Appointments: Return Appointment in 1 week. - [name] [name] RM 1 Friday 9.6 @ 10:15 am Nurse Visit: - Tues. 9/3 @ 3:30 pm Anesthetic: (In  clinic) Topical Lidocaine 4% applied to wound bed Bathing/ Shower/ Hygiene: May shower with protection but do not get wound dressing(s) wet. Protect dressing(s) with water repellant cover (for example, large plastic bag) or a cast cover and may then take shower. Negative Presssure Wound Therapy: Wound Vac to wound continuously at 172mm/hg pressure Black Foam Edema Control - Lymphedema / SCD / Other: Lymphedema Pumps. Use Lymphedema pumps on leg(s) 2-3 times a day for 45-60 minutes. If wearing any wraps or hose, do not remove them. Continue exercising as instructed. Elevate legs to the level of the heart or above for 30 minutes daily and/or when sitting for 3-4 times a day throughout the day. - throughout the day Avoid standing for long periods of time. WOUND #3: - Lower Leg Wound Laterality: Right, Medial Peri-Wound Care: Skin Prep 1 x Per Week/30  Days Discharge Instructions: cavelon to macerated wound margins Peri-Wound Care: Sween Lotion (Moisturizing lotion) 1 x Per Week/30 Days Discharge Instructions: Apply moisturizing lotion as directed Prim Dressing: Maxorb Extra Ag+ Alginate Dressing, 4x4.75 (in/in) 1 x Per Week/30 Days ary Discharge Instructions: Apply toexcoriated weeping areas Prim Dressing: peel and place vac 1 x Per Week/30 Days ary Com pression Wrap: ThreePress (3 layer compression wrap) 1 x Per Week/30 Days Discharge Instructions: Apply three layer compression as directed. WOUND #6: - Lower Leg Wound Laterality: Right, Anterior Peri-Wound Care: Sween Lotion (Moisturizing lotion) 1 x Per Week/30 Days Discharge Instructions: Apply moisturizing lotion as directed Prim Dressing: peel and place vac 1 x Per Week/30 Days ary Com pression Wrap: ThreePress (3 layer compression wrap) 1 x Per Week/30 Days Discharge Instructions: Apply three layer compression as directed. WOUND #8: - Foot Wound Laterality: Dorsal, Left Cleanser: Soap and Water 1 x Per Week/30 Days Discharge Instructions: May shower and wash wound with dial antibacterial soap and water prior to dressing change. Peri-Wound Care: Sween Lotion (Moisturizing lotion) 1 x Per Week/30 Days Discharge Instructions: Apply moisturizing lotion as directed Prim Dressing: Maxorb Extra Ag+ Alginate Dressing, 2x2 (in/in) 1 x Per Week/30 Days ary Discharge Instructions: Apply to wound bed as instructed Secondary Dressing: Woven Gauze Sponge, Non-Sterile 4x4 in 1 x Per Week/30 Days Discharge Instructions: Apply over primary dressing as directed. Com pression Wrap: ThreePress (3 layer compression wrap) 1 x Per Week/30 Days Discharge Instructions: Apply three layer compression as directed. QUALIN, VAZIRI ([id number]) [id number] Page 14 of 16 03/25/2023: The wound to his left anterior tibial surface has healed. The left dorsal foot wound has gotten smaller  and has a small area of hypertrophic granulation tissue present. The periwound skin on the right medial lower leg looks slightly better, but still appears excoriated. The actual wounds continue to contract but are still accumulating quite a bit of slough. The tiny right anterior leg wounds are contracting. I chemically cauterized the left dorsal foot wound with silver nitrate. I debrided slough from the open areas on the right leg. We are going to continue to try and use the [facility] wound VAC here to manage his drainage, but will apply silver alginate underneath the drape to absorb drainage from the excoriated tissue. Continue silver alginate to the dorsal foot with bilateral Urgo lite wraps. Follow-up in 1 week Electronic Signature(s) Signed: 03/25/2023 11:50:38 AM By: Duanne Guess MD FACS Previous Signature: 03/25/2023 11:43:25 AM Version By: Duanne Guess MD FACS Entered By: Duanne Guess on 03/25/2023 08:50:38 -------------------------------------------------------------------------------- HxROS Details Patient Name: Date of Service: [name], ALMONTE [name] 03/25/2023 11:00 A M Medical Record  Number: [id number] Patient Account Number: [id number] Date of Birth/Sex: Treating RN: May 01, 1951 (72 y.o. M) Primary Care Provider: [name] Other Clinician: Referring Provider: Treating Provider/Extender: [name] in Treatment: 23 Information Obtained From Patient Chart Constitutional Symptoms (General Health) Medical History: Past Medical History Notes: obesity Eyes Medical History: Positive for: Cataracts - right eye removed Negative for: Glaucoma; Optic Neuritis Ear/Nose/Mouth/Throat Medical History: Negative for: Chronic sinus problems/congestion; Middle ear problems Past Medical History Notes: hard of hearing Hematologic/Lymphatic Medical History: Positive for: Anemia - macrocytic; Lymphedema Respiratory Medical History: Past Medical  History Notes: frozen diaphragm right Cardiovascular Medical History: Positive for: Congestive Heart Failure; Deep Vein Thrombosis - left leg; Hypertension; Peripheral Venous Disease Past Medical History Notes: hyperlipidemia Endocrine Medical History: Negative for: Type I Diabetes; Type II Diabetes RUAN, ATONDO ([id number]) [id number] Page 15 of 16 Genitourinary Medical History: Negative for: End Stage Renal Disease Integumentary (Skin) Medical History: Negative for: History of Burn Musculoskeletal Medical History: Past Medical History Notes: myositis, lumbar DDD, spinal stenosis, cervical facet joint syndrome Oncologic Medical History: Negative for: Received Chemotherapy; Received Radiation Psychiatric Medical History: Negative for: Anorexia/bulimia; Confinement Anxiety HBO Extended History Items Eyes: Cataracts Immunizations Pneumococcal Vaccine: Received Pneumococcal Vaccination: Yes Received Pneumococcal Vaccination On or After 60th Birthday: Yes Implantable Devices No devices added Hospitalization / Surgery History Type of Hospitalization/Surgery cervical fusion lumbar surgery coronary stent placement lasik eye surgery hydrocele excision Family and Social History Cancer: Yes - Mother; Diabetes: No; Heart Disease: Yes - Father,Mother; Hereditary Spherocytosis: No; Hypertension: Yes - Father,Siblings; Kidney Disease: No; Lung Disease: Yes - Siblings; Seizures: No; Stroke: No; Thyroid Problems: No; Tuberculosis: No; Former smoker - quit 1991; Marital Status - Married; Alcohol Use: Moderate; Drug Use: No History; Caffeine Use: Daily - coffee; Financial Concerns: No; Food, Clothing or Shelter Needs: No; Support System Lacking: No; Transportation Concerns: No Psychologist, prison and probation services) Signed: 03/25/2023 12:18:18 PM By: Duanne Guess MD FACS Entered By: Duanne Guess on 03/25/2023  08:40:33 -------------------------------------------------------------------------------- SuperBill Details Patient Name: Date of Service: KOLBI, SHETTY 03/25/2023 Medical Record Number: [id number] Patient Account Number: [id number] Date of Birth/Sex: Treating RN: 25-Jun-1951 (72 y.o. 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) I89.0 Lymphedema, not elsewhere classified I10 Essential (primary) hypertension I25.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 angina pectoris Facility Procedures : CPT4 Code: 64403474 Description: 97597 - DEBRIDE WOUND 1ST 20 SQ CM OR < ICD-10 Diagnosis Description 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 Modifier: ed Quantity: 1 : CPT4 Code: 25956387 Description: 56433 - DEBRIDE WOUND EA ADDL 20 SQ CM ICD-10 Diagnosis Description 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 Modifier: ed Quantity: 1 : CPT4 Code: 29518841 Description: 66063 - WOUND VAC-50 SQ CM OR LESS Modifier: Quantity: 1 : CPT4 Code: 01601093 Description: 17250 - CHEM CAUT GRANULATION TISS ICD-10 Diagnosis Description L97.521 Non-pressure chronic ulcer of other part of left foot limited to breakdown of sk Modifier: in Quantity: 1 Physician Procedures : CPT4 Code Description Modifier [id number] 20254 -  WC PHYS LEVEL  4 - EST PT 25 ICD-10 Diagnosis Description 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ed L97.521 Non-pressure chronic ulcer of other part of left  foot limited to breakdown of skin 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.0 Lymphedema, not elsewhere classified Quantity: 1 : [id number] 97597 - WC PHYS DEBR WO ANESTH 20 SQ CM ICD-10 Diagnosis Description 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ed Quantity: 1 : [id number] 97598 - WC PHYS DEBR WO ANESTH EA ADD 20 CM ICD-10 Diagnosis Description 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ed Quantity: 1 : 2440102 17250 - WC PHYS CHEM CAUT GRAN TISSUE ICD-10 Diagnosis Description L97.521 Non-pressure chronic ulcer of other part of left foot limited to breakdown of skin Quantity: 1 Electronic Signature(s) Signed: 03/25/2023 11:52:00 AM By: Duanne Guess MD FACS Entered By: Duanne Guess on 03/25/2023 08:51:59

## 2023-03-25 NOTE — Progress Notes (Signed)
[name], [name] ([id number]) [id number] Page 1 of 13 Visit Report for 03/25/2023 Arrival Information Details Patient Name: Date of Service: [name] [name] 03/25/2023 11:00 A M Medical Record Number: [id number] Patient Account Number: [id number] Date of Birth/Sex: Treating RN: Oct 22, 1950 (72 y.o. [name] Primary Care Annaliza Zia: [name] Other Clinician: Referring Marlow Berenguer: Treating Oshay Stranahan/Extender: [name] in Treatment: 5 Visit Information History Since Last Visit Added or deleted any medications: No Patient Arrived: Wheel Chair Any new allergies or adverse reactions: No Arrival Time: 10:51 Had a fall or experienced change in No Accompanied By: self activities of daily living that may affect Transfer Assistance: None risk of falls: Patient Identification Verified: Yes Signs or symptoms of abuse/neglect since last visito No Secondary Verification Process Completed: Yes Hospitalized since last visit: No Patient Requires Transmission-Based Precautions: No Implantable device outside of the clinic excluding No Patient Has Alerts: Yes cellular tissue based products placed in the center Patient Alerts: R ABI= N/C, TBI= .75 since last visit: L ABI =N/C, TBI = .57 Has Dressing in Place as Prescribed: Yes Has Compression in Place as Prescribed: Yes Pain Present Now: No Electronic Signature(s) Signed: 03/25/2023 12:16:34 PM By: Zenaida Deed RN, BSN Entered By: Zenaida Deed on 03/25/2023 07:52:08 -------------------------------------------------------------------------------- Compression Therapy Details Patient Name: Date of Service: [name] 03/25/2023 11:00 A M Medical Record Number: [id number] Patient Account Number: [id number] Date of Birth/Sex: Treating RN: [birth year] (72 y.o. [name] Primary Care Chastin Riesgo: [name] Other Clinician: Referring Anouk Critzer: Treating Marcelino Campos/Extender:  [name] in Treatment: 6 Compression Therapy Performed for Wound Assessment: Wound #3 Right,Medial Lower Leg Performed By: Clinician Zenaida Deed, RN Compression Type: Three Layer Post Procedure Diagnosis Same as Pre-procedure Electronic Signature(s) Signed: 03/25/2023 12:16:34 PM By: Zenaida Deed RN, BSN Entered By: Zenaida Deed on 03/25/2023 08:37:17 [name] ([id number]) [id number] Page 2 of 13 -------------------------------------------------------------------------------- Compression Therapy Details Patient Name: Date of Service: [name] 03/25/2023 11:00 A M Medical Record Number: [id number] Patient Account Number: [id number] Date of Birth/Sex: Treating RN: Sep 19, 1950 (72 y.o. [name] Primary Care Conor Lata: [name] Other Clinician: Referring Joeann Steppe: Treating Coal Nearhood/Extender: [name] in Treatment: 75 Compression Therapy Performed for Wound Assessment: Wound #8 Left,Dorsal Foot Performed By: Clinician Zenaida Deed, RN Compression Type: Three Layer Post Procedure Diagnosis Same as Pre-procedure Electronic Signature(s) Signed: 03/25/2023 12:16:34 PM By: Zenaida Deed RN, BSN Entered By: Zenaida Deed on 03/25/2023 08:37:17 -------------------------------------------------------------------------------- Encounter Discharge Information Details Patient Name: Date of Service: [name] [name] 03/25/2023 11:00 A M Medical Record Number: [id number] Patient Account Number: [id number] Date of Birth/Sex: Treating RN: [birth year] (72 y.o. [name] Primary Care [name]: [name] Other Clinician: Referring [name]: Treating Deveron [name]/Extender: [name] in Treatment: 35 Encounter Discharge Information Items Post Procedure Vitals Discharge Condition: Stable Temperature (F): 97.9 Ambulatory Status:  Wheelchair Pulse (bpm): 62 Discharge Destination: Home Respiratory Rate (breaths/min): 18 Transportation: Private Auto Blood Pressure (mmHg): 130/80 Accompanied By: self Schedule Follow-up Appointment: Yes Clinical Summary of Care: Patient Declined Electronic Signature(s) Signed: 03/25/2023 12:16:34 PM By: Zenaida Deed RN, BSN Entered By: Zenaida Deed on 03/25/2023 09:16:03 -------------------------------------------------------------------------------- Lower Extremity Assessment Details Patient Name: Date of Service: [name] 03/25/2023 11:00 A M Medical Record Number: [id number] Patient Account Number: [id number] Date of Birth/Sex: Treating RN: 05/25/1951 (72 y.o. [name] Primary Care [name]: [name] Other Clinician: Referring [name]: Treating [name]/Extender: [name] in Treatment: 58 Edema Assessment Assessed: [Left:  No] [Right: No] Edema: [Left: Yes] [Right: Yes] Calf BAHE, SANKEY ([id number]) [id number] Page 3 of 13 Left: Right: Point of Measurement: From Medial Instep 41.5 cm 42 cm Ankle Left: Right: Point of Measurement: From Medial Instep 26.5 cm 26.5 cm Vascular Assessment Pulses: Dorsalis Pedis Palpable: [Left:Yes] [Right:Yes] Extremity colors, hair growth, and conditions: Extremity Color: [Left:Hyperpigmented] [Right:Hyperpigmented] Hair Growth on Extremity: [Left:Yes] [Right:Yes] Temperature of Extremity: [Left:Warm < 3 seconds] [Right:Warm < 3 seconds] Electronic Signature(s) Signed: 03/25/2023 12:16:34 PM By: Zenaida Deed RN, BSN Entered By: Zenaida Deed on 03/25/2023 08:02:02 -------------------------------------------------------------------------------- Multi Wound Chart Details Patient Name: Date of Service: [name] 03/25/2023 11:00 A M Medical Record Number: [id number] Patient Account Number: [id number] Date of Birth/Sex: Treating RN: [birth year] (72  y.o. M) Primary Care Bisma Klett: [name] Other Clinician: Referring [name]: Treating [name]/Extender: [name] in Treatment: 68 Vital Signs Height(in): 71 Pulse(bpm): 62 Weight(lbs): 267 Blood Pressure(mmHg): 130/80 Body Mass Index(BMI): 37.2 Temperature(F): 97.9 Respiratory Rate(breaths/min): 18 [3:Photos:] Right, Medial Lower Leg Right, Anterior Lower Leg Left, Anterior Lower Leg Wound Location: Gradually Appeared Gradually Appeared Blister Wounding Event: Lymphedema Calcinosis Lymphedema Primary Etiology: Cataracts, Anemia, Lymphedema, Cataracts, Anemia, Lymphedema, Cataracts, Anemia, Lymphedema, Comorbid History: Congestive Heart Failure, Deep Vein Congestive Heart Failure, Deep Vein Congestive Heart Failure, Deep Vein Thrombosis, Hypertension, Peripheral Thrombosis, Hypertension, Peripheral Thrombosis, Hypertension, Peripheral Venous Disease Venous Disease Venous Disease 12/07/2021 05/12/2022 03/09/2023 Date Acquired: 58 45 2 Weeks of Treatment: Open Open Healed - Epithelialized Wound Status: No No No Wound Recurrence: No Yes No Clustered Wound: 9x5.5x0.3 1.4x0.2x0.1 0x0x0 Measurements L x W x D (cm) 38.877 0.22 0 A (cm) : rea 11.663 0.022 0 Volume (cm) : -627.90% 6.80% 100.00% % Reduction in Area: -446.00% 8.30% 100.00% % Reduction in Volume: Full Thickness With Exposed Support Full Thickness Without Exposed Full Thickness Without Exposed ClassificationARLING, MISKIN ([id number]) [id number] Page 4 of 13 Structures Support Structures Support Structures Patent attorney None Present Exudate Amount: Serous Serous N/A Exudate Type: Media planner N/A Exudate Color: Indistinct, nonvisible Epibole N/A Wound Margin: Medium (34-66%) None Present (0%) None Present (0%) Granulation Amount: Red N/A N/A Granulation Quality: Medium (34-66%) None Present (0%) None Present (0%) Necrotic Amount: Fat Layer  (Subcutaneous Tissue): Yes Fat Layer (Subcutaneous Tissue): Yes Fascia: No Exposed Structures: Fascia: No Fascia: No Fat Layer (Subcutaneous Tissue): No Tendon: No Tendon: No Tendon: No Muscle: No Muscle: No Muscle: No Joint: No Joint: No Joint: No Bone: No Bone: No Bone: No Small (1-33%) Large (67-100%) Large (67-100%) Epithelialization: Debridement - Selective/Open Wound N/A N/A Debridement: Pre-procedure Verification/Time Out 11:30 N/A N/A Taken: Lidocaine 4% Topical Solution N/A N/A Pain Control: Slough N/A N/A Tissue Debrided: Non-Viable Tissue N/A N/A Level: 31.09 N/A N/A Debridement A (sq cm): rea Curette N/A N/A Instrument: Minimum N/A N/A Bleeding: Pressure N/A N/A Hemostasis A chieved: 3 N/A N/A Procedural Pain: 2 N/A N/A Post Procedural Pain: Procedure was tolerated well N/A N/A Debridement Treatment Response: 9x5.5x0.3 N/A N/A Post Debridement Measurements L x W x D (cm) 11.663 N/A N/A Post Debridement Volume: (cm) Excoriation: Yes No Abnormalities Noted No Abnormalities Noted Periwound Skin Texture: No Abnormalities Noted No Abnormalities Noted No Abnormalities Noted Periwound Skin Moisture: Erythema: Yes No Abnormalities Noted Hemosiderin Staining: Yes Periwound Skin Color: No Abnormality No Abnormality No Abnormality Temperature: Compression Therapy N/A N/A Procedures Performed: Debridement Negative Pressure Wound Therapy Maintenance (NPWT) Wound Number: 8 N/A N/A Photos: N/A N/A Left, Dorsal Foot N/A N/A Wound Location: Gradually Appeared N/A N/A Wounding Event: Lymphedema  N/A N/A Primary Etiology: Cataracts, Anemia, Lymphedema, N/A N/A Comorbid History: Congestive Heart Failure, Deep Vein Thrombosis, Hypertension, Peripheral Venous Disease 03/18/2023 N/A N/A Date Acquired: 1 N/A N/A Weeks of Treatment: Open N/A N/A Wound Status: No N/A N/A Wound Recurrence: No N/A N/A Clustered Wound: 0.3x0.5x0.1 N/A  N/A Measurements L x W x D (cm) 0.118 N/A N/A A (cm) : rea 0.012 N/A N/A Volume (cm) : 39.80% N/A N/A % Reduction in Area: 40.00% N/A N/A % Reduction in Volume: Full Thickness Without Exposed N/A N/A Classification: Support Structures Medium N/A N/A Exudate Amount: Serous N/A N/A Exudate Type: amber N/A N/A Exudate Color: Flat and Intact N/A N/A Wound Margin: Large (67-100%) N/A N/A Granulation Amount: Red N/A N/A Granulation Quality: Small (1-33%) N/A N/A Necrotic Amount: Fat Layer (Subcutaneous Tissue): Yes N/A N/A Exposed Structures: Fascia: No Tendon: No Muscle: No Joint: No Bone: No Medium (34-66%) N/A N/A Epithelialization: N/A N/A N/A Debridement: N/A N/A N/A Pain ControlGIOVANNY, [name] ([id number]) [id number] Page 5 of 13 N/A N/A N/A Tissue Debrided: N/A N/A N/A Level: N/A N/A N/A Debridement A (sq cm): rea N/A N/A N/A Instrument: N/A N/A N/A Bleeding: N/A N/A N/A Hemostasis A chieved: N/A N/A N/A Procedural Pain: N/A N/A N/A Post Procedural Pain: N/A N/A N/A Debridement Treatment Response: N/A N/A N/A Post Debridement Measurements L x W x D (cm) N/A N/A N/A Post Debridement Volume: (cm) No Abnormalities Noted N/A N/A Periwound Skin Texture: No Abnormalities Noted N/A N/A Periwound Skin Moisture: No Abnormalities Noted N/A N/A Periwound Skin Color: No Abnormality N/A N/A Temperature: Chemical Cauterization N/A N/A Procedures Performed: Compression Therapy Treatment Notes Electronic Signature(s) Signed: 03/25/2023 11:38:29 AM By: Duanne Guess MD FACS Entered By: Duanne Guess on 03/25/2023 08:38:29 -------------------------------------------------------------------------------- Multi-Disciplinary Care Plan Details Patient Name: Date of Service: [name] 03/25/2023 11:00 A M Medical Record Number: [id number] Patient Account Number: [id number] Date of Birth/Sex: Treating RN: [birth year] (72  y.o. [name] Primary Care Remiel Corti: [name] Other Clinician: Referring Kalee Mcclenathan: Treating Aasia Peavler/Extender: [name] in Treatment: 27 Multidisciplinary Care Plan reviewed with physician Active Inactive Venous Leg Ulcer Nursing Diagnoses: Knowledge deficit related to disease process and management Potential for venous Insuffiency (use before diagnosis confirmed) Goals: Patient will maintain optimal edema control Date Initiated: 02/17/2022 Target Resolution Date: 04/22/2023 Goal Status: Active Interventions: Assess peripheral edema status every visit. Compression as ordered Provide education on venous insufficiency Treatment Activities: Non-invasive vascular studies : 02/17/2022 Therapeutic compression applied : 02/17/2022 Notes: Wound/Skin Impairment Nursing Diagnoses: Impaired tissue integrity Knowledge deficit related to ulceration/compromised skin integrity GoalsSQUARE, ENSINGER ([id number]) [id number] Page 6 of 13 Patient/caregiver will verbalize understanding of skin care regimen Date Initiated: 02/17/2022 Target Resolution Date: 04/22/2023 Goal Status: Active Ulcer/skin breakdown will have a volume reduction of 30% by week 4 Date Initiated: 02/17/2022 Date Inactivated: 03/10/2022 Target Resolution Date: 03/10/2022 Unmet Reason: infection being treated, Goal Status: Unmet waiting on derm appte Ulcer/skin breakdown will have a volume reduction of 50% by week 8 Date Initiated: 03/10/2022 Date Inactivated: 04/14/2022 Target Resolution Date: 04/07/2022 Goal Status: Unmet Unmet Reason: calcinosis Ulcer/skin breakdown will have a volume reduction of 80% by week 12 Date Initiated: 04/14/2022 Date Inactivated: 05/05/2022 Target Resolution Date: 05/05/2022 Unmet Reason: infection, chronic Goal Status: Unmet condition Interventions: Assess patient/caregiver ability to obtain necessary supplies Assess  patient/caregiver ability to perform ulcer/skin care regimen upon admission and as needed Assess ulceration(s) every visit Provide education on ulcer and skin care Treatment Activities: Skin care regimen initiated :  02/17/2022 Topical wound management initiated : 02/17/2022 Notes: Electronic Signature(s) Signed: 03/25/2023 12:16:34 PM By: Zenaida Deed RN, BSN Entered By: Zenaida Deed on 03/25/2023 08:14:48 -------------------------------------------------------------------------------- Negative Pressure Wound Therapy Maintenance (NPWT) Details Patient Name: Date of Service: [name] 03/25/2023 11:00 A M Medical Record Number: [id number] Patient Account Number: [id number] Date of Birth/Sex: Treating RN: 27-Feb-1951 (72 y.o. [name] Primary Care Abrar Bilton: [name] Other Clinician: Referring Ashten Prats: Treating Olar Santini/Extender: [name] in Treatment: 58 NPWT Maintenance Performed for: Wound #3 Right, Medial Lower Leg Additional Injuries Covered: Yes Additional Injuries: Wound #6 Right, Anterior Lower Leg Performed By: Zenaida Deed, RN Type: VAC System Coverage Size (sq cm): 49.78 Pressure Type: Constant Pressure Setting: 125 mmHG Drain Type: None Primary Contact: None Sponge/Dressing Type: Foam- Black Date Initiated: 03/11/2023 Dressing Removed: Yes Quantity of Sponges/Gauze Removed: 1 Canister Changed: Yes Canister Exudate Volume: 250 Dressing Reapplied: Yes Quantity of Sponges/Gauze Inserted: 1 Respones T Treatment: o good Days On NPWT : 15 Post Procedure Diagnosis Same as Pre-procedure Notes Peel and Place dressing used Electronic Signature(sTAJA, GABEL ([id number]) [id number] Page 7 of 13 Signed: 03/25/2023 12:16:34 PM By: Zenaida Deed RN, BSN Entered By: Zenaida Deed on 03/25/2023 08:37:02 -------------------------------------------------------------------------------- Pain  Assessment Details Patient Name: Date of Service: IYON, HOWES 03/25/2023 11:00 A M Medical Record Number: [id number] Patient Account Number: [id number] Date of Birth/Sex: Treating RN: 03/15/1951 (72 y.o. [name] Primary Care Ndea Kilroy: [name] Other Clinician: Referring Xayla Puzio: Treating Ayo Guarino/Extender: [name] in Treatment: 68 Active Problems Location of Pain Severity and Description of Pain Patient Has Paino No Site Locations Rate the pain. Current Pain Level: 0 Pain Management and Medication Current Pain Management: Electronic Signature(s) Signed: 03/25/2023 12:16:34 PM By: Zenaida Deed RN, BSN Entered By: Zenaida Deed on 03/25/2023 07:53:10 -------------------------------------------------------------------------------- Patient/Caregiver Education Details Patient Name: Date of Service: Blitzer, [name] 8/30/2024andnbsp11:00 A M Medical Record Number: [id number] Patient Account Number: [id number] Date of Birth/Gender: Treating RN: 14-Mar-1951 (72 y.o. [name] Primary Care Physician: [name] Other Clinician: Referring Physician: Treating Physician/Extender: [name] in Treatment: 13 Education Assessment Education Provided To: Patient TAISHI, SLUSSER ([id number]) [id number] Page 8 of 13 Education Topics Provided Infection: Methods: Explain/Verbal Responses: Reinforcements needed, State content correctly Venous: Methods: Explain/Verbal Responses: Reinforcements needed, State content correctly Electronic Signature(s) Signed: 03/25/2023 12:16:34 PM By: Zenaida Deed RN, BSN Entered By: Zenaida Deed on 03/25/2023 08:15:27 -------------------------------------------------------------------------------- Wound Assessment Details Patient Name: Date of Service: [name] [name] 03/25/2023 11:00 A M Medical Record Number: [id number] Patient Account  Number: [id number] Date of Birth/Sex: Treating RN: 1951-05-14 (72 y.o. [name] Primary Care Shamonica Schadt: [name] Other Clinician: Referring Lendell Gallick: Treating Danilo Cappiello/Extender: [name] in Treatment: 58 Wound Status Wound Number: 3 Primary Lymphedema Etiology: Wound Location: Right, Medial Lower Leg Wound Open Wounding Event: Gradually Appeared Status: Date Acquired: 12/07/2021 Comorbid Cataracts, Anemia, Lymphedema, Congestive Heart Failure, Deep Weeks Of Treatment: 58 History: Vein Thrombosis, Hypertension, Peripheral Venous Disease Clustered Wound: No Photos Wound Measurements Length: (cm) 9 Width: (cm) 5.5 Depth: (cm) 0.3 Area: (cm) 38.877 Volume: (cm) 11.663 % Reduction in Area: -627.9% % Reduction in Volume: -446% Epithelialization: Small (1-33%) Tunneling: No Undermining: No Wound Description Classification: Full Thickness With Exposed Suppo Wound Margin: Indistinct, nonvisible Exudate Amount: Large Exudate Type: Serous Exudate Color: amber rt Structures Foul Odor After Cleansing: No Slough/Fibrino Yes Wound Bed Granulation Amount: Medium (34-66%) Exposed Structure Granulation Quality: Red Fascia Exposed: No Necrotic  Amount: Medium (34-66%) Fat Layer (Subcutaneous Tissue) Exposed: Yes Necrotic Quality: Adherent Slough Tendon Exposed: No Muscle Exposed: No THAI, MACKILLOP ([id number]) [id number] Page 9 of 13 Joint Exposed: No Bone Exposed: No Periwound Skin Texture Texture Color No Abnormalities Noted: No No Abnormalities Noted: No Excoriation: Yes Erythema: Yes Moisture Temperature / Pain No Abnormalities Noted: Yes Temperature: No Abnormality Treatment Notes Wound #3 (Lower Leg) Wound Laterality: Right, Medial Cleanser Peri-Wound Care Skin Prep Discharge Instruction: cavelon to macerated wound margins Sween Lotion (Moisturizing lotion) Discharge Instruction: Apply  moisturizing lotion as directed Topical Primary Dressing Maxorb Extra Ag+ Alginate Dressing, 4x4.75 (in/in) Discharge Instruction: Apply toexcoriated weeping areas peel and place vac Secondary Dressing Secured With Compression Wrap ThreePress (3 layer compression wrap) Discharge Instruction: Apply three layer compression as directed. Compression Stockings Add-Ons Electronic Signature(s) Signed: 03/25/2023 12:16:34 PM By: Zenaida Deed RN, BSN Entered By: Zenaida Deed on 03/25/2023 08:11:15 -------------------------------------------------------------------------------- Wound Assessment Details Patient Name: Date of Service: [name] 03/25/2023 11:00 A M Medical Record Number: [id number] Patient Account Number: [id number] Date of Birth/Sex: Treating RN: 02/09/1951 (72 y.o. [name] Primary Care Joyce Heitman: [name] Other Clinician: Referring Nikia Levels: Treating Sumner Boesch/Extender: [name] in Treatment: 58 Wound Status Wound Number: 6 Primary Calcinosis Etiology: Wound Location: Right, Anterior Lower Leg Wound Open Wounding Event: Gradually Appeared Status: Date Acquired: 05/12/2022 Comorbid Cataracts, Anemia, Lymphedema, Congestive Heart Failure, Deep Weeks Of Treatment: 45 History: Vein Thrombosis, Hypertension, Peripheral Venous Disease Clustered Wound: Yes Photos OC, LEMMEN ([id number]) [id number] Page 10 of 13 Wound Measurements Length: (cm) 1.4 Width: (cm) 0.2 Depth: (cm) 0.1 Area: (cm) 0.22 Volume: (cm) 0.022 % Reduction in Area: 6.8% % Reduction in Volume: 8.3% Epithelialization: Large (67-100%) Tunneling: No Undermining: No Wound Description Classification: Full Thickness Without Exposed Suppor Wound Margin: Epibole Exudate Amount: Small Exudate Type: Serous Exudate Color: amber t Structures Foul Odor After Cleansing: No Slough/Fibrino No Wound Bed Granulation Amount:  None Present (0%) Exposed Structure Necrotic Amount: None Present (0%) Fascia Exposed: No Fat Layer (Subcutaneous Tissue) Exposed: Yes Tendon Exposed: No Muscle Exposed: No Joint Exposed: No Bone Exposed: No Periwound Skin Texture Texture Color No Abnormalities Noted: Yes No Abnormalities Noted: Yes Moisture Temperature / Pain No Abnormalities Noted: Yes Temperature: No Abnormality Treatment Notes Wound #6 (Lower Leg) Wound Laterality: Right, Anterior Cleanser Peri-Wound Care Sween Lotion (Moisturizing lotion) Discharge Instruction: Apply moisturizing lotion as directed Topical Primary Dressing peel and place vac Secondary Dressing Secured With Compression Wrap ThreePress (3 layer compression wrap) Discharge Instruction: Apply three layer compression as directed. Compression Stockings Add-Ons Electronic Signature(s) Signed: 03/25/2023 12:16:34 PM By: Zenaida Deed RN, BSN Entered By: Zenaida Deed on 03/25/2023 08:12:30 [name] ([id number]) [id number] Page 11 of 13 -------------------------------------------------------------------------------- Wound Assessment Details Patient Name: Date of Service: LUISMANUEL, [name] 03/25/2023 11:00 A M Medical Record Number: [id number] Patient Account Number: [id number] Date of Birth/Sex: Treating RN: [birth year] (72 y.o. [name] Primary Care Lennette Fader: [name] Other Clinician: Referring Viney Acocella: Treating Olar Santini/Extender: [name] in Treatment: 58 Wound Status Wound Number: 7 Primary Lymphedema Etiology: Wound Location: Left, Anterior Lower Leg Wound Healed - Epithelialized Wounding Event: Blister Status: Date Acquired: 03/09/2023 Comorbid Cataracts, Anemia, Lymphedema, Congestive Heart Failure, Deep Weeks Of Treatment: 2 History: Vein Thrombosis, Hypertension, Peripheral Venous Disease Clustered Wound: No Photos Wound Measurements Length:  (cm) Width: (cm) Depth: (cm) Area: (cm) Volume: (cm) 0 % Reduction in Area: 100% 0 % Reduction in Volume: 100% 0 Epithelialization: Large (67-100%) 0 Tunneling: No  0 Undermining: No Wound Description Classification: Full Thickness Without Exposed Support Exudate Amount: None Present Structures Foul Odor After Cleansing: No Slough/Fibrino No Wound Bed Granulation Amount: None Present (0%) Exposed Structure Necrotic Amount: None Present (0%) Fascia Exposed: No Fat Layer (Subcutaneous Tissue) Exposed: No Tendon Exposed: No Muscle Exposed: No Joint Exposed: No Bone Exposed: No Periwound Skin Texture Texture Color No Abnormalities Noted: Yes No Abnormalities Noted: No Hemosiderin Staining: Yes Moisture No Abnormalities Noted: Yes Temperature / Pain Temperature: No Abnormality Electronic Signature(s) Signed: 03/25/2023 12:16:34 PM By: Zenaida Deed RN, BSN Entered By: Zenaida Deed on 03/25/2023 08:13:09 [name] ([id number]) [id number] Page 12 of 13 -------------------------------------------------------------------------------- Wound Assessment Details Patient Name: Date of Service: [name], [name] 03/25/2023 11:00 A M Medical Record Number: [id number] Patient Account Number: [id number] Date of Birth/Sex: Treating RN: 06-Nov-1950 (72 y.o. [name] Primary Care Saagar Tortorella: [name] Other Clinician: Referring Emonni Depasquale: Treating Sharika Mosquera/Extender: [name] in Treatment: 58 Wound Status Wound Number: 8 Primary Lymphedema Etiology: Wound Location: Left, Dorsal Foot Wound Open Wounding Event: Gradually Appeared Status: Date Acquired: 03/18/2023 Comorbid Cataracts, Anemia, Lymphedema, Congestive Heart Failure, Deep Weeks Of Treatment: 1 History: Vein Thrombosis, Hypertension, Peripheral Venous Disease Clustered Wound: No Photos Wound Measurements Length: (cm) 0.3 Width: (cm) 0.5 Depth: (cm)  0.1 Area: (cm) 0.118 Volume: (cm) 0.012 % Reduction in Area: 39.8% % Reduction in Volume: 40% Epithelialization: Medium (34-66%) Tunneling: No Undermining: No Wound Description Classification: Full Thickness Without Exposed Suppor Wound Margin: Flat and Intact Exudate Amount: Medium Exudate Type: Serous Exudate Color: amber t Structures Foul Odor After Cleansing: No Slough/Fibrino Yes Wound Bed Granulation Amount: Large (67-100%) Exposed Structure Granulation Quality: Red Fascia Exposed: No Necrotic Amount: Small (1-33%) Fat Layer (Subcutaneous Tissue) Exposed: Yes Necrotic Quality: Adherent Slough Tendon Exposed: No Muscle Exposed: No Joint Exposed: No Bone Exposed: No Periwound Skin Texture Texture Color No Abnormalities Noted: Yes No Abnormalities Noted: Yes Moisture Temperature / Pain No Abnormalities Noted: Yes Temperature: No Abnormality Treatment Notes Wound #8 (Foot) Wound Laterality: Dorsal, Left Cleanser Soap and Water Discharge Instruction: May shower and wash wound with dial antibacterial soap and water prior to dressing change. GANNON, NICCUM ([id number]) [id number] Page 13 of 13 Peri-Wound Care Sween Lotion (Moisturizing lotion) Discharge Instruction: Apply moisturizing lotion as directed Topical Primary Dressing Maxorb Extra Ag+ Alginate Dressing, 2x2 (in/in) Discharge Instruction: Apply to wound bed as instructed Secondary Dressing Woven Gauze Sponge, Non-Sterile 4x4 in Discharge Instruction: Apply over primary dressing as directed. Secured With Compression Wrap ThreePress (3 layer compression wrap) Discharge Instruction: Apply three layer compression as directed. Compression Stockings Add-Ons Electronic Signature(s) Signed: 03/25/2023 12:16:34 PM By: Zenaida Deed RN, BSN Entered By: Zenaida Deed on 03/25/2023 08:14:16 -------------------------------------------------------------------------------- Vitals  Details Patient Name: Date of Service: [name] 03/25/2023 11:00 A M Medical Record Number: [id number] Patient Account Number: [id number] Date of Birth/Sex: Treating RN: 27-Jul-1950 (72 y.o. [name] Primary Care Essense Bousquet: [name] Other Clinician: Referring Jaysun Wessels: Treating Amirrah Quigley/Extender: [name] in Treatment: 63 Vital Signs Time Taken: 10:52 Temperature (F): 97.9 Height (in): 71 Pulse (bpm): 62 Weight (lbs): 267 Respiratory Rate (breaths/min): 18 Body Mass Index (BMI): 37.2 Blood Pressure (mmHg): 130/80 Reference Range: 80 - 120 mg / dl Electronic Signature(s) Signed: 03/25/2023 12:16:34 PM By: Zenaida Deed RN, BSN Entered By: Zenaida Deed on 03/25/2023 07:52:37

## 2023-03-29 ENCOUNTER — Encounter (HOSPITAL_BASED_OUTPATIENT_CLINIC_OR_DEPARTMENT_OTHER): Payer: Medicare Other | Attending: General Surgery | Admitting: General Surgery

## 2023-03-29 DIAGNOSIS — I890 Lymphedema, not elsewhere classified: Secondary | ICD-10-CM | POA: Insufficient documentation

## 2023-03-29 DIAGNOSIS — M609 Myositis, unspecified: Secondary | ICD-10-CM | POA: Insufficient documentation

## 2023-03-29 DIAGNOSIS — I110 Hypertensive heart disease with heart failure: Secondary | ICD-10-CM | POA: Diagnosis not present

## 2023-03-29 DIAGNOSIS — I2510 Atherosclerotic heart disease of native coronary artery without angina pectoris: Secondary | ICD-10-CM | POA: Diagnosis not present

## 2023-03-29 DIAGNOSIS — Z86718 Personal history of other venous thrombosis and embolism: Secondary | ICD-10-CM | POA: Diagnosis not present

## 2023-03-29 DIAGNOSIS — Z87891 Personal history of nicotine dependence: Secondary | ICD-10-CM | POA: Diagnosis not present

## 2023-03-29 DIAGNOSIS — L942 Calcinosis cutis: Secondary | ICD-10-CM | POA: Insufficient documentation

## 2023-03-29 DIAGNOSIS — I872 Venous insufficiency (chronic) (peripheral): Secondary | ICD-10-CM | POA: Insufficient documentation

## 2023-03-29 DIAGNOSIS — L97812 Non-pressure chronic ulcer of other part of right lower leg with fat layer exposed: Secondary | ICD-10-CM | POA: Insufficient documentation

## 2023-03-29 DIAGNOSIS — L97521 Non-pressure chronic ulcer of other part of left foot limited to breakdown of skin: Secondary | ICD-10-CM | POA: Diagnosis not present

## 2023-03-29 DIAGNOSIS — I509 Heart failure, unspecified: Secondary | ICD-10-CM | POA: Insufficient documentation

## 2023-03-29 NOTE — Progress Notes (Signed)
JOHNAVAN, DOEDEN ([id number]) [id number] Page 1 of 6 Visit Report for 03/29/2023 Arrival Information Details Patient Name: Date of Service: [name], [name] 03/29/2023 3:30 PM Medical Record Number: [id number] Patient Account Number: [id number] Date of Birth/Sex: Treating RN: [birth year] (72 y.o. [name] Primary Care Ramia Sidney: [name] Other Clinician: Referring Cierra Rothgeb: Treating Desiree Daise/Extender: [name] in Treatment: 62 Visit Information History Since Last Visit Added or deleted any medications: No Patient Arrived: Wheel Chair Any new allergies or adverse reactions: No Arrival Time: 15:15 Had a fall or experienced change in No Accompanied By: self activities of daily living that may affect Transfer Assistance: None risk of falls: Patient Identification Verified: Yes Signs or symptoms of abuse/neglect since last visito No Secondary Verification Process Completed: Yes Hospitalized since last visit: No Patient Requires Transmission-Based Precautions: No Implantable device outside of the clinic excluding No Patient Has Alerts: Yes cellular tissue based products placed in the center Patient Alerts: R ABI= N/C, TBI= .75 since last visit: L ABI =N/C, TBI = .57 Has Dressing in Place as Prescribed: Yes Has Compression in Place as Prescribed: Yes Pain Present Now: No Electronic Signature(s) Signed: 03/29/2023 5:02:17 PM By: Zenaida Deed RN, BSN Entered By: Zenaida Deed on 03/29/2023 15:51:56 -------------------------------------------------------------------------------- Compression Therapy Details Patient Name: Date of Service: [name] 03/29/2023 3:30 PM Medical Record Number: [id number] Patient Account Number: [id number] Date of Birth/Sex: Treating RN: 10-Sep-1950 (72 y.o. [name] Primary Care Starletta Houchin: [name] Other Clinician: Referring Jorita Bohanon: Treating Cassi Jenne/Extender: [name] in Treatment: 31 Compression Therapy Performed for Wound Assessment: Wound #3 Right,Medial Lower Leg Performed By: Clinician Zenaida Deed, RN Compression Type: Three Emergency planning/management officer) Signed: 03/29/2023 5:02:17 PM By: Zenaida Deed RN, BSN Entered By: Zenaida Deed on 03/29/2023 15:58:26 -------------------------------------------------------------------------------- Encounter Discharge Information Details Patient Name: Date of Service: [name] 03/29/2023 3:30 PM Medical Record Number: [id number] Patient Account Number: [id number] [name], [name] ([id number]) [id number] Page 2 of 6 Date of Birth/Sex: Treating RN: [birth year] (72 y.o. [name] Primary Care Arrin Ishler: Other Clinician: [name] Referring Elania Crowl: Treating Lasean Rahming/Extender: [name] in Treatment: 34 Encounter Discharge Information Items Discharge Condition: Stable Ambulatory Status: Wheelchair Discharge Destination: Home Transportation: Private Auto Accompanied By: self Schedule Follow-up Appointment: Yes Clinical Summary of Care: Patient Declined Electronic Signature(s) Signed: 03/29/2023 5:02:17 PM By: Zenaida Deed RN, BSN Entered By: Zenaida Deed on 03/29/2023 16:37:29 -------------------------------------------------------------------------------- Negative Pressure Wound Therapy Maintenance (NPWT) Details Patient Name: Date of Service: [name], [name] 03/29/2023 3:30 PM Medical Record Number: [id number] Patient Account Number: [id number] Date of Birth/Sex: Treating RN: 1950/08/24 (72 y.o. [name] Primary Care Airiel Oblinger: [name] Other Clinician: Referring Davionne Mastrangelo: Treating Syanna Remmert/Extender: [name] in Treatment: 59 NPWT Maintenance Performed for: Wound #3 Right, Medial Lower Leg Additional Injuries Covered: Yes Additional Injuries: Wound  #6 Right, Anterior Lower Leg Performed By: Zenaida Deed, RN Coverage Size (sq cm): 49.78 Pressure Type: Constant Pressure Setting: 125 mmHG Drain Type: None Primary Contact: None Sponge/Dressing Type: Foam- Black Date Initiated: 03/11/2023 Dressing Removed: Yes Quantity of Sponges/Gauze Removed: 1 Canister Changed: No Canister Exudate Volume: 100 Dressing Reapplied: Yes Quantity of Sponges/Gauze Inserted: 1 Respones T Treatment: o good Days On NPWT : 19 Electronic Signature(s) Signed: 03/29/2023 5:02:17 PM By: Zenaida Deed RN, BSN Entered By: Zenaida Deed on 03/29/2023 15:56:51 -------------------------------------------------------------------------------- Patient/Caregiver Education Details Patient Name: Date of Service: [name] 9/3/2024andnbsp3:30 PM Medical Record Number: [id number] Patient Account Number: [id number] Date  of Birth/Gender: Treating RN: 11-07-1950 (72 y.o. [name] Primary Care Physician: [name] Other Clinician: Referring Physician: Treating Physician/Extender: [name] in Treatment: [address], [place] ([id number]) [id number] Page 3 of 6 Education Assessment Education Provided To: Patient Education Topics Provided Venous: Methods: Explain/Verbal Responses: Reinforcements needed, State content correctly Electronic Signature(s) Signed: 03/29/2023 5:02:17 PM By: Zenaida Deed RN, BSN Entered By: Zenaida Deed on 03/29/2023 16:36:47 -------------------------------------------------------------------------------- Wound Assessment Details Patient Name: Date of Service: [name], [name] 03/29/2023 3:30 PM Medical Record Number: [id number] Patient Account Number: [id number] Date of Birth/Sex: Treating RN: 1951-05-14 (72 y.o. [name] Primary Care Lisett Dirusso: [name] Other Clinician: Referring Anesia Blackwell: Treating Jelena Malicoat/Extender: [name] in Treatment: 46 Wound Status Wound Number: 3 Primary Etiology: Lymphedema Wound Location: Right, Medial Lower Leg Wound Status: Open Wounding Event: Gradually Appeared Date Acquired: 12/07/2021 Weeks Of Treatment: 59 Clustered Wound: No Wound Measurements Length: (cm) 9 Width: (cm) 5.5 Depth: (cm) 0.3 Area: (cm) 38.877 Volume: (cm) 11.663 % Reduction in Area: -627.9% % Reduction in Volume: -446% Wound Description Classification: Full Thickness With Exposed Support Exudate Amount: Large Exudate Type: Serous Exudate Color: amber Structures Periwound Skin Texture Texture Color No Abnormalities Noted: No No Abnormalities Noted: No Moisture No Abnormalities Noted: No Treatment Notes Wound #3 (Lower Leg) Wound Laterality: Right, Medial Cleanser Peri-Wound Care Skin Prep Discharge Instruction: cavelon to macerated wound margins Sween Lotion (Moisturizing lotion) Discharge Instruction: Apply moisturizing lotion as directed MYKOL, GOYTIA ([id number]) [id number] Page 4 of 6 Topical Primary Dressing Maxorb Extra Ag+ Alginate Dressing, 4x4.75 (in/in) Discharge Instruction: Apply toexcoriated weeping areas peel and place vac Secondary Dressing Secured With Compression Wrap ThreePress (3 layer compression wrap) Discharge Instruction: Apply three layer compression as directed. Compression Stockings Add-Ons Electronic Signature(s) Signed: 03/29/2023 5:02:17 PM By: Zenaida Deed RN, BSN Entered By: Zenaida Deed on 03/29/2023 15:53:33 -------------------------------------------------------------------------------- Wound Assessment Details Patient Name: Date of Service: [name], [name] 03/29/2023 3:30 PM Medical Record Number: [id number] Patient Account Number: [id number] Date of Birth/Sex: Treating RN: 10-Jun-1951 (72 y.o. [name] Primary Care Kelwin Gibler: [name] Other Clinician: Referring Benyamin Jeff: Treating  Julietta Batterman/Extender: [name] in Treatment: 59 Wound Status Wound Number: 6 Primary Etiology: Calcinosis Wound Location: Right, Anterior Lower Leg Wound Status: Open Wounding Event: Gradually Appeared Date Acquired: 05/12/2022 Weeks Of Treatment: 45 Clustered Wound: Yes Wound Measurements Length: (cm) 1.4 Width: (cm) 0.2 Depth: (cm) 0.1 Area: (cm) 0.22 Volume: (cm) 0.022 % Reduction in Area: 6.8% % Reduction in Volume: 8.3% Wound Description Classification: Full Thickness Without Exposed Suppor Exudate Amount: Small Exudate Type: Serous Exudate Color: amber t Structures Periwound Skin Texture Texture Color No Abnormalities Noted: No No Abnormalities Noted: No Moisture No Abnormalities Noted: No Treatment Notes Wound #6 (Lower Leg) Wound Laterality: Right, Anterior Cleanser Peri-Wound Care Sween Lotion (Moisturizing lotion) [name], [name] ([id number]) [id number] Page 5 of 6 Discharge Instruction: Apply moisturizing lotion as directed Topical Primary Dressing peel and place vac Secondary Dressing Secured With Compression Wrap ThreePress (3 layer compression wrap) Discharge Instruction: Apply three layer compression as directed. Compression Stockings Add-Ons Electronic Signature(s) Signed: 03/29/2023 5:02:17 PM By: Zenaida Deed RN, BSN Entered By: Zenaida Deed on 03/29/2023 15:53:33 -------------------------------------------------------------------------------- Wound Assessment Details Patient Name: Date of Service: [name], [name] 03/29/2023 3:30 PM Medical Record Number: [id number] Patient Account Number: [id number] Date of Birth/Sex: Treating RN: 05-Feb-1951 (72 y.o. [name] Primary Care Aradia Estey: [name] Other Clinician: Referring Findlay Dagher: Treating Donaldo Teegarden/Extender: [name]  in Treatment: 59 Wound Status Wound Number: 8 Primary Etiology:  Lymphedema Wound Location: Left, Dorsal Foot Wound Status: Open Wounding Event: Gradually Appeared Date Acquired: 03/18/2023 Weeks Of Treatment: 1 Clustered Wound: No Wound Measurements Length: (cm) 0.3 Width: (cm) 0.5 Depth: (cm) 0.1 Area: (cm) 0.118 Volume: (cm) 0.012 % Reduction in Area: 39.8% % Reduction in Volume: 40% Wound Description Classification: Full Thickness Without Exposed Suppor Exudate Amount: Medium Exudate Type: Serous Exudate Color: amber t Structures Periwound Skin Texture Texture Color No Abnormalities Noted: No No Abnormalities Noted: No Moisture No Abnormalities Noted: No Treatment Notes Wound #8 (Foot) Wound Laterality: Dorsal, Left Cleanser Soap and Water Discharge Instruction: May shower and wash wound with dial antibacterial soap and water prior to dressing change. [name], [name] ([id number]) [id number] Page 6 of 6 Peri-Wound Care Sween Lotion (Moisturizing lotion) Discharge Instruction: Apply moisturizing lotion as directed Topical Primary Dressing Maxorb Extra Ag+ Alginate Dressing, 2x2 (in/in) Discharge Instruction: Apply to wound bed as instructed Secondary Dressing Woven Gauze Sponge, Non-Sterile 4x4 in Discharge Instruction: Apply over primary dressing as directed. Secured With Compression Wrap ThreePress (3 layer compression wrap) Discharge Instruction: Apply three layer compression as directed. Compression Stockings Add-Ons Electronic Signature(s) Signed: 03/29/2023 5:02:17 PM By: Zenaida Deed RN, BSN Entered By: Zenaida Deed on 03/29/2023 15:53:33

## 2023-03-30 NOTE — Progress Notes (Signed)
AAHAAN, [name] ([id number]) [id number] Page 1 of 1 Visit Report for 03/29/2023 SuperBill Details Patient Name: Date of Service: [name], [name] 03/29/2023 Medical Record Number: [id number] Patient Account Number: [id number] Date of Birth/Sex: Treating RN: Aug 13, 1950 (72 y.o. 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

## 2023-04-01 ENCOUNTER — Encounter (HOSPITAL_BASED_OUTPATIENT_CLINIC_OR_DEPARTMENT_OTHER): Payer: Medicare Other | Admitting: General Surgery

## 2023-04-01 DIAGNOSIS — I872 Venous insufficiency (chronic) (peripheral): Secondary | ICD-10-CM | POA: Diagnosis not present

## 2023-04-01 DIAGNOSIS — L97521 Non-pressure chronic ulcer of other part of left foot limited to breakdown of skin: Secondary | ICD-10-CM | POA: Diagnosis not present

## 2023-04-01 DIAGNOSIS — M609 Myositis, unspecified: Secondary | ICD-10-CM | POA: Diagnosis not present

## 2023-04-01 DIAGNOSIS — I890 Lymphedema, not elsewhere classified: Secondary | ICD-10-CM | POA: Diagnosis not present

## 2023-04-01 DIAGNOSIS — L942 Calcinosis cutis: Secondary | ICD-10-CM | POA: Diagnosis not present

## 2023-04-01 DIAGNOSIS — L97812 Non-pressure chronic ulcer of other part of right lower leg with fat layer exposed: Secondary | ICD-10-CM | POA: Diagnosis not present

## 2023-04-01 NOTE — Progress Notes (Signed)
VALEN, LANDWEHR ([id number]) [id number] Page 1 of 13 Visit Report for 04/01/2023 Arrival Information Details Patient Name: Date of Service: [name], [name] 04/01/2023 10:15 A M Medical Record Number: [id number] Patient Account Number: [id number] Date of Birth/Sex: Treating RN: October 30, 1950 (72 y.o. [name] Primary Care Brynleigh Sequeira: [name] Other Clinician: Referring Vernestine Brodhead: Treating Aune Adami/Extender: [name] in Treatment: 28 Visit Information History Since Last Visit Added or deleted any medications: No Patient Arrived: Wheel Chair Any new allergies or adverse reactions: No Arrival Time: 10:20 Had a fall or experienced change in No Accompanied By: self activities of daily living that may affect Transfer Assistance: None risk of falls: Patient Identification Verified: Yes Signs or symptoms of abuse/neglect since last visito No Secondary Verification Process Completed: Yes Hospitalized since last visit: No Patient Requires Transmission-Based Precautions: No Implantable device outside of the clinic excluding No Patient Has Alerts: Yes cellular tissue based products placed in the center Patient Alerts: R ABI= N/C, TBI= .75 since last visit: L ABI =N/C, TBI = .57 Has Dressing in Place as Prescribed: Yes Has Compression in Place as Prescribed: Yes Pain Present Now: No Electronic Signature(s) Signed: 04/01/2023 1:19:41 PM By: Zenaida Deed RN, BSN Entered By: Zenaida Deed on 04/01/2023 07:29:47 -------------------------------------------------------------------------------- Compression Therapy Details Patient Name: Date of Service: [name] 04/01/2023 10:15 A M Medical Record Number: [id number] Patient Account Number: [id number] Date of Birth/Sex: Treating RN: [birth year] (72 y.o. [name] Primary Care Basil Blakesley: [name] Other Clinician: Referring Ece Cumberland: Treating Yulia Ulrich/Extender: [name] in Treatment: 77 Compression Therapy Performed for Wound Assessment: Wound #3 Right,Medial Lower Leg Performed By: Clinician Zenaida Deed, RN Compression Type: Three Layer Post Procedure Diagnosis Same as Pre-procedure Electronic Signature(s) Signed: 04/01/2023 1:19:41 PM By: Zenaida Deed RN, BSN Entered By: Zenaida Deed on 04/01/2023 08:03:13 [name] ([id number]) [id number] Page 2 of 13 -------------------------------------------------------------------------------- Encounter Discharge Information Details Patient Name: Date of Service: [name], [name] 04/01/2023 10:15 A M Medical Record Number: [id number] Patient Account Number: [id number] Date of Birth/Sex: Treating RN: 1951-03-04 (72 y.o. [name] Primary Care Kolson Chovanec: [name] Other Clinician: Referring Leontine Radman: Treating Hosteen Kienast/Extender: [name] in Treatment: 69 Encounter Discharge Information Items Post Procedure Vitals Discharge Condition: Stable Temperature (F): 97.8 Ambulatory Status: Wheelchair Pulse (bpm): 62 Discharge Destination: Home Respiratory Rate (breaths/min): 18 Transportation: Private Auto Blood Pressure (mmHg): 154/80 Accompanied By: self Schedule Follow-up Appointment: Yes Clinical Summary of Care: Patient Declined Electronic Signature(s) Signed: 04/01/2023 1:19:41 PM By: Zenaida Deed RN, BSN Entered By: Zenaida Deed on 04/01/2023 09:07:51 -------------------------------------------------------------------------------- Lower Extremity Assessment Details Patient Name: Date of Service: [name], [name] 04/01/2023 10:15 A M Medical Record Number: [id number] Patient Account Number: [id number] Date of Birth/Sex: Treating RN: 27-Feb-1951 (72 y.o. [name] Primary Care Marylene Masek: [name] Other Clinician: Referring Joseangel Nettleton: Treating Oreta Soloway/Extender: [name] in Treatment: 59 Edema Assessment Assessed: Kyra Searles: No] [Right: No] Edema: [Left: Yes] [Right: Yes] Calf Left: Right: Point of Measurement: From Medial Instep 39.5 cm 40.5 cm Ankle Left: Right: Point of Measurement: From Medial Instep 24 cm 27.5 cm Vascular Assessment Pulses: Dorsalis Pedis Palpable: [Left:Yes] [Right:Yes] Extremity colors, hair growth, and conditions: Extremity Color: [Left:Hyperpigmented] [Right:Red] Hair Growth on Extremity: [Left:Yes] [Right:Yes] Temperature of Extremity: [Left:Warm < 3 seconds] [Right:Warm < 3 seconds] Electronic Signature(s) Signed: 04/01/2023 1:19:41 PM By: Zenaida Deed RN, BSN Entered By: Zenaida Deed on 04/01/2023 07:46:04 [name] ([id number]) [id number] Page 3 of 13 -------------------------------------------------------------------------------- Multi Wound  Chart Details Patient Name: Date of Service: [name], [name] 04/01/2023 10:15 A M Medical Record Number: [id number] Patient Account Number: [id number] Date of Birth/Sex: Treating RN: 16-Jun-1951 (72 y.o. [name] Primary Care Markale Birdsell: [name] Other Clinician: Referring Rai Severns: Treating Liddy Deam/Extender: [name] in Treatment: 22 Vital Signs Height(in): 71 Pulse(bpm): 62 Weight(lbs): 267 Blood Pressure(mmHg): 154/80 Body Mass Index(BMI): 37.2 Temperature(F): 97.8 Respiratory Rate(breaths/min): 18 [3:Photos:] Right, Medial Lower Leg Right, Anterior Lower Leg Left, Dorsal Foot Wound Location: Gradually Appeared Gradually Appeared Gradually Appeared Wounding Event: Lymphedema Calcinosis Lymphedema Primary Etiology: Cataracts, Anemia, Lymphedema, Cataracts, Anemia, Lymphedema, Cataracts, Anemia, Lymphedema, Comorbid History: Congestive Heart Failure, Deep Vein Congestive Heart Failure, Deep Vein Congestive Heart Failure, Deep Vein Thrombosis, Hypertension, Peripheral Thrombosis,  Hypertension, Peripheral Thrombosis, Hypertension, Peripheral Venous Disease Venous Disease Venous Disease 12/07/2021 05/12/2022 03/18/2023 Date Acquired: 59 46 2 Weeks of Treatment: Open Open Open Wound Status: No No No Wound Recurrence: No Yes No Clustered Wound: N/A 2 N/A Clustered Quantity: 8.9x5.8x0.3 1.4x0.3x0.2 1.1x1.3x0.1 Measurements L x W x D (cm) 40.542 0.33 1.123 A (cm) : rea 12.163 0.066 0.112 Volume (cm) : -659.10% -39.80% -473.00% % Reduction in Area: -469.40% -175.00% -460.00% % Reduction in Volume: Full Thickness With Exposed Support Full Thickness Without Exposed Full Thickness Without Exposed Classification: Structures Support Structures Support Structures Large Small Medium Exudate Amount: Serous Serous Serosanguineous Exudate Type: amber amber red, brown Exudate Color: Yes No No Foul Odor A Cleansing: fter No N/A N/A Odor Anticipated Due to Product Use: Indistinct, nonvisible Epibole Flat and Intact Wound Margin: Medium (34-66%) Small (1-33%) Medium (34-66%) Granulation A mount: Red Red Red Granulation Quality: Medium (34-66%) Large (67-100%) Medium (34-66%) Necrotic Amount: Fat Layer (Subcutaneous Tissue): Yes Fat Layer (Subcutaneous Tissue): Yes Fat Layer (Subcutaneous Tissue): Yes Exposed Structures: Fascia: No Fascia: No Fascia: No Tendon: No Tendon: No Tendon: No Muscle: No Muscle: No Muscle: No Joint: No Joint: No Joint: No Bone: No Bone: No Bone: No Small (1-33%) Large (67-100%) Medium (34-66%) Epithelialization: Debridement - Excisional Debridement - Selective/Open Wound Debridement - Selective/Open Wound Debridement: Pre-procedure Verification/Time Out 11:20 11:20 11:20 Taken: Lidocaine 4% Topical Solution Lidocaine 4% Topical Solution Lidocaine 4% Topical Solution Pain Control: Other, Subcutaneous, Principal Financial Tissue Debrided: Skin/Subcutaneous Tissue Non-Viable Tissue Non-Viable Tissue Level: 30.39  0.16 1.12 Debridement A (sq cm): rea Curette Curette Curette Instrument: Minimum Minimum Minimum Bleeding: Pressure Pressure Pressure Hemostasis A chieved: 3 0 3 Procedural Pain: 2 0 2 Post Procedural Pain: Procedure was tolerated well Procedure was tolerated well Procedure was tolerated well Debridement Treatment ResponseSIMS, [name] ([id number]) [id number] Page 4 of 13 8.9x5.8x0.4 1.4x0.3x0.2 1.1x1.3x0.1 Post Debridement Measurements L x W x D (cm) 16.217 0.066 0.112 Post Debridement Volume: (cm) Excoriation: Yes No Abnormalities Noted No Abnormalities Noted Periwound Skin Texture: No Abnormalities Noted No Abnormalities Noted No Abnormalities Noted Periwound Skin Moisture: Erythema: Yes Hemosiderin Staining: Yes No Abnormalities Noted Periwound Skin Color: Circumferential N/A N/A Erythema Location: No Abnormality No Abnormality No Abnormality Temperature: Compression Therapy Debridement Debridement Procedures Performed: Debridement Negative Pressure Wound Therapy Maintenance (NPWT) Wound Number: 9 N/A N/A Photos: N/A N/A Left, Lateral Lower Leg N/A N/A Wound Location: Gradually Appeared N/A N/A Wounding Event: Lymphedema N/A N/A Primary Etiology: Cataracts, Anemia, Lymphedema, N/A N/A Comorbid History: Congestive Heart Failure, Deep Vein Thrombosis, Hypertension, Peripheral Venous Disease 03/22/2023 N/A N/A Date Acquired: 0 N/A N/A Weeks of Treatment: Open N/A N/A Wound Status: No N/A N/A Wound Recurrence: No N/A N/A Clustered Wound: N/A N/A N/A  Clustered Quantity: 2x1.8x0.1 N/A N/A Measurements L x W x D (cm) 2.827 N/A N/A A (cm) : rea 0.283 N/A N/A Volume (cm) : N/A N/A N/A % Reduction in Area: N/A N/A N/A % Reduction in Volume: Full Thickness Without Exposed N/A N/A Classification: Support Structures Medium N/A N/A Exudate Amount: Purulent N/A N/A Exudate Type: yellow, brown, green N/A N/A Exudate  Color: No N/A N/A Foul Odor A Cleansing: fter N/A N/A N/A Odor Anticipated Due to Product Use: Flat and Intact N/A N/A Wound Margin: Medium (34-66%) N/A N/A Granulation A mount: Red N/A N/A Granulation Quality: Medium (34-66%) N/A N/A Necrotic Amount: Fat Layer (Subcutaneous Tissue): Yes N/A N/A Exposed Structures: Fascia: No Tendon: No Muscle: No Joint: No Bone: No Small (1-33%) N/A N/A Epithelialization: N/A N/A N/A Debridement: N/A N/A N/A Pain Control: N/A N/A N/A Tissue Debrided: N/A N/A N/A Level: N/A N/A N/A Debridement A (sq cm): rea N/A N/A N/A Instrument: N/A N/A N/A Bleeding: N/A N/A N/A Hemostasis A chieved: N/A N/A N/A Procedural Pain: N/A N/A N/A Post Procedural Pain: Debridement Treatment Response: N/A N/A N/A Post Debridement Measurements L x N/A N/A N/A W x D (cm) N/A N/A N/A Post Debridement Volume: (cm) No Abnormalities Noted N/A N/A Periwound Skin Texture: No Abnormalities Noted N/A N/A Periwound Skin Moisture: Erythema: Yes N/A N/A Periwound Skin Color: Circumferential N/A N/A Erythema Location: No Abnormality N/A N/A Temperature: N/A N/A N/A Procedures Performed: Treatment Notes [name], [name] ([id number]) [id number] Page 5 of 13 Electronic Signature(s) Signed: 04/01/2023 11:58:50 AM By: Duanne Guess MD FACS Signed: 04/01/2023 1:19:41 PM By: Zenaida Deed RN, BSN Entered By: Duanne Guess on 04/01/2023 08:58:50 -------------------------------------------------------------------------------- Multi-Disciplinary Care Plan Details Patient Name: Date of Service: JAYLEEN, JOAS 04/01/2023 10:15 A M Medical Record Number: [id number] Patient Account Number: [id number] Date of Birth/Sex: Treating RN: 1951-07-25 (72 y.o. [name] Primary Care Mylan Schwarz: [name] Other Clinician: Referring Candis Kabel: Treating Symphany Fleissner/Extender: [name] in Treatment:  12 Multidisciplinary Care Plan reviewed with physician Active Inactive Venous Leg Ulcer Nursing Diagnoses: Knowledge deficit related to disease process and management Potential for venous Insuffiency (use before diagnosis confirmed) Goals: Patient will maintain optimal edema control Date Initiated: 02/17/2022 Target Resolution Date: 04/22/2023 Goal Status: Active Interventions: Assess peripheral edema status every visit. Compression as ordered Provide education on venous insufficiency Treatment Activities: Non-invasive vascular studies : 02/17/2022 Therapeutic compression applied : 02/17/2022 Notes: Wound/Skin Impairment Nursing Diagnoses: Impaired tissue integrity Knowledge deficit related to ulceration/compromised skin integrity Goals: Patient/caregiver will verbalize understanding of skin care regimen Date Initiated: 02/17/2022 Target Resolution Date: 04/22/2023 Goal Status: Active Ulcer/skin breakdown will have a volume reduction of 30% by week 4 Date Initiated: 02/17/2022 Date Inactivated: 03/10/2022 Target Resolution Date: 03/10/2022 Unmet Reason: infection being treated, Goal Status: Unmet waiting on derm appte Ulcer/skin breakdown will have a volume reduction of 50% by week 8 Date Initiated: 03/10/2022 Date Inactivated: 04/14/2022 Target Resolution Date: 04/07/2022 Goal Status: Unmet Unmet Reason: calcinosis Ulcer/skin breakdown will have a volume reduction of 80% by week 12 Date Initiated: 04/14/2022 Date Inactivated: 05/05/2022 Target Resolution Date: 05/05/2022 Unmet Reason: infection, chronic Goal Status: Unmet condition Interventions: Assess patient/caregiver ability to obtain necessary supplies Assess patient/caregiver ability to perform ulcer/skin care regimen upon admission and as needed Assess ulceration(s) every visit Provide education on ulcer and skin care [name], [name] ([id number]) [id number] Page 6 of 13 Treatment  Activities: Skin care regimen initiated : 02/17/2022 Topical wound management initiated : 02/17/2022 Notes: Electronic Signature(s) Signed: 04/01/2023 1:19:41 PM  By: Zenaida Deed RN, BSN Entered By: Zenaida Deed on 04/01/2023 07:59:19 -------------------------------------------------------------------------------- Negative Pressure Wound Therapy Maintenance (NPWT) Details Patient Name: Date of Service: [name], [name] 04/01/2023 10:15 A M Medical Record Number: [id number] Patient Account Number: [id number] Date of Birth/Sex: Treating RN: Jan 30, 1951 (72 y.o. [name] Primary Care Maloni Musleh: [name] Other Clinician: Referring Hendrix Console: Treating Iva Montelongo/Extender: [name] in Treatment: 59 NPWT Maintenance Performed for: Wound #3 Right, Medial Lower Leg Additional Injuries Covered: Yes Additional Injuries: Wound #6 Right, Anterior Lower Leg Performed By: Zenaida Deed, RN Type: VAC System Coverage Size (sq cm): 52.04 Pressure Type: Constant Pressure Setting: 125 mmHG Drain Type: None Primary Contact: None Sponge/Dressing Type: Foam- Black Date Initiated: 03/11/2023 Dressing Removed: Yes Quantity of Sponges/Gauze Removed: 1 Canister Changed: No Canister Exudate Volume: 100 Dressing Reapplied: Yes Quantity of Sponges/Gauze Inserted: 1 Respones T Treatment: o good Days On NPWT : 22 Post Procedure Diagnosis Same as Pre-procedure Notes peel and press dressing Electronic Signature(s) Signed: 04/01/2023 1:19:41 PM By: Zenaida Deed RN, BSN Entered By: Zenaida Deed on 04/01/2023 08:05:13 -------------------------------------------------------------------------------- Pain Assessment Details Patient Name: Date of Service: [name] 04/01/2023 10:15 A M Medical Record Number: [id number] Patient Account Number: [id number] Date of Birth/Sex: Treating RN: 1950/10/28 (72 y.o. [name] Primary Care Tamea Bai: [name]  Other Clinician: Referring Blimi Godby: Treating Randol Zumstein/Extender: [name] in Treatment: [address] [place] ([id number]) [id number] Page 7 of 13 Active Problems Location of Pain Severity and Description of Pain Patient Has Paino No Site Locations Rate the pain. Current Pain Level: 0 Pain Management and Medication Current Pain Management: Electronic Signature(s) Signed: 04/01/2023 1:19:41 PM By: Zenaida Deed RN, BSN Entered By: Zenaida Deed on 04/01/2023 07:30:18 -------------------------------------------------------------------------------- Patient/Caregiver Education Details Patient Name: Date of Service: Roemmich, [name] 9/6/2024andnbsp10:15 A M Medical Record Number: [id number] Patient Account Number: [id number] Date of Birth/Gender: Treating RN: August 06, 1950 (72 y.o. [name] Primary Care Physician: [name] Other Clinician: Referring Physician: Treating Physician/Extender: [name] in Treatment: 5 Education Assessment Education Provided To: Patient Education Topics Provided Venous: Methods: Explain/Verbal Responses: Reinforcements needed, State content correctly Wound/Skin Impairment: Methods: Explain/Verbal Responses: Reinforcements needed, State content correctly Electronic Signature(s) Signed: 04/01/2023 1:19:41 PM By: Zenaida Deed RN, BSN Entered By: Zenaida Deed on 04/01/2023 07:59:41 [name] ([id number]) [id number] Page 8 of 13 -------------------------------------------------------------------------------- Wound Assessment Details Patient Name: Date of Service: SRIVATSA, MASHEK 04/01/2023 10:15 A M Medical Record Number: [id number] Patient Account Number: [id number] Date of Birth/Sex: Treating RN: August 14, 1950 (72 y.o. [name] Primary Care Eugina Row: [name] Other Clinician: Referring Melora Menon: Treating  Armiyah Capron/Extender: [name] in Treatment: 59 Wound Status Wound Number: 3 Primary Lymphedema Etiology: Wound Location: Right, Medial Lower Leg Wound Open Wounding Event: Gradually Appeared Status: Date Acquired: 12/07/2021 Comorbid Cataracts, Anemia, Lymphedema, Congestive Heart Failure, Deep Weeks Of Treatment: 59 History: Vein Thrombosis, Hypertension, Peripheral Venous Disease Clustered Wound: No Photos Wound Measurements Length: (cm) 8.9 Width: (cm) 5.8 Depth: (cm) 0.3 Area: (cm) 40.542 Volume: (cm) 12.163 % Reduction in Area: -659.1% % Reduction in Volume: -469.4% Epithelialization: Small (1-33%) Tunneling: No Undermining: No Wound Description Classification: Full Thickness With Exposed Suppo Wound Margin: Indistinct, nonvisible Exudate Amount: Large Exudate Type: Serous Exudate Color: amber rt Structures Foul Odor After Cleansing: Yes Due to Product Use: No Slough/Fibrino Yes Wound Bed Granulation Amount: Medium (34-66%) Exposed Structure Granulation Quality: Red Fascia Exposed: No Necrotic Amount: Medium (34-66%) Fat Layer (Subcutaneous Tissue) Exposed: Yes  Necrotic Quality: Adherent Slough Tendon Exposed: No Muscle Exposed: No Joint Exposed: No Bone Exposed: No Periwound Skin Texture Texture Color No Abnormalities Noted: No No Abnormalities Noted: No Excoriation: Yes Erythema: Yes Erythema Location: Circumferential Moisture No Abnormalities Noted: Yes Temperature / Pain Temperature: No Abnormality Treatment Notes Wound #3 (Lower Leg) Wound Laterality: Right, Medial TOXIE, SIMONINI ([id number]) [id number] Page 9 of 13 Peri-Wound Care Skin Prep Discharge Instruction: cavelon to macerated wound margins Sween Lotion (Moisturizing lotion) Discharge Instruction: Apply moisturizing lotion as directed Topical Gentamicin Discharge Instruction: in lager holes only Primary Dressing Maxorb  Extra Ag+ Alginate Dressing, 4x4.75 (in/in) Discharge Instruction: Apply toexcoriated weeping areas peel and place vac Secondary Dressing Secured With Compression Wrap ThreePress (3 layer compression wrap) Discharge Instruction: Apply three layer compression as directed. Compression Stockings Add-Ons Electronic Signature(s) Signed: 04/01/2023 1:19:41 PM By: Zenaida Deed RN, BSN Entered By: Zenaida Deed on 04/01/2023 07:54:06 -------------------------------------------------------------------------------- Wound Assessment Details Patient Name: Date of Service: [name], [name] 04/01/2023 10:15 A M Medical Record Number: [id number] Patient Account Number: [id number] Date of Birth/Sex: Treating RN: [birth year] (72 y.o. [name] Primary Care Odie Rauen: [name] Other Clinician: Referring Dariana Garbett: Treating [name]/Extender: [name] in Treatment: 59 Wound Status Wound Number: 6 Primary Calcinosis Etiology: Wound Location: Right, Anterior Lower Leg Wound Open Wounding Event: Gradually Appeared Status: Date Acquired: 05/12/2022 Comorbid Cataracts, Anemia, Lymphedema, Congestive Heart Failure, Deep Weeks Of Treatment: 46 History: Vein Thrombosis, Hypertension, Peripheral Venous Disease Clustered Wound: Yes Photos Wound Measurements Length: (cm) 1.4 Width: (cm) 0.3 Depth: (cm) 0.2 Clustered Quantity: 2 Cowie, Benancio ([id number]) Area: (cm) 0.33 Volume: (cm) 0.066 % Reduction in Area: -39.8% % Reduction in Volume: -175% Epithelialization: Large (67-100%) Tunneling: No [id number] Page 10 of 13 Undermining: No Wound Description Classification: Full Thickness Without Exposed Suppor Wound Margin: Epibole Exudate Amount: Small Exudate Type: Serous Exudate Color: amber t Structures Foul Odor After Cleansing: No Slough/Fibrino Yes Wound Bed Granulation Amount: Small (1-33%) Exposed Structure Granulation  Quality: Red Fascia Exposed: No Necrotic Amount: Large (67-100%) Fat Layer (Subcutaneous Tissue) Exposed: Yes Necrotic Quality: Adherent Slough Tendon Exposed: No Muscle Exposed: No Joint Exposed: No Bone Exposed: No Periwound Skin Texture Texture Color No Abnormalities Noted: Yes No Abnormalities Noted: No Hemosiderin Staining: Yes Moisture No Abnormalities Noted: Yes Temperature / Pain Temperature: No Abnormality Treatment Notes Wound #6 (Lower Leg) Wound Laterality: Right, Anterior Cleanser Peri-Wound Care Sween Lotion (Moisturizing lotion) Discharge Instruction: Apply moisturizing lotion as directed Topical Primary Dressing peel and place vac Secondary Dressing Secured With Compression Wrap ThreePress (3 layer compression wrap) Discharge Instruction: Apply three layer compression as directed. Compression Stockings Add-Ons Electronic Signature(s) Signed: 04/01/2023 1:19:41 PM By: Zenaida Deed RN, BSN Entered By: Zenaida Deed on 04/01/2023 07:55:37 -------------------------------------------------------------------------------- Wound Assessment Details Patient Name: Date of Service: [name], [name] 04/01/2023 10:15 A M Medical Record Number: [id number] Patient Account Number: [id number] Date of Birth/Sex: Treating RN: [birth year] (72 y.o. [name] Primary Care Josimar Corning: [name] Other Clinician: Referring Siya Flurry: Treating Amedio Bowlby/Extender: [name] in Treatment: 59 Wound Status Wound Number: 8 Primary Lymphedema [name], [name] ([id number]) [id number] Page 11 of 13 Etiology: Wound Location: Left, Dorsal Foot Wound Open Wounding Event: Gradually Appeared Status: Date Acquired: 03/18/2023 Comorbid Cataracts, Anemia, Lymphedema, Congestive Heart Failure, Deep Weeks Of Treatment: 2 History: Vein Thrombosis, Hypertension, Peripheral Venous Disease Clustered Wound: No Photos Wound  Measurements Length: (cm) 1.1 Width: (cm) 1.3 Depth: (cm) 0.1 Area: (cm) 1.123 Volume: (cm) 0.112 % Reduction in Area: -  473% % Reduction in Volume: -460% Epithelialization: Medium (34-66%) Tunneling: No Undermining: No Wound Description Classification: Full Thickness Without Exposed Suppor Wound Margin: Flat and Intact Exudate Amount: Medium Exudate Type: Serosanguineous Exudate Color: red, brown t Structures Foul Odor After Cleansing: No Slough/Fibrino Yes Wound Bed Granulation Amount: Medium (34-66%) Exposed Structure Granulation Quality: Red Fascia Exposed: No Necrotic Amount: Medium (34-66%) Fat Layer (Subcutaneous Tissue) Exposed: Yes Necrotic Quality: Adherent Slough Tendon Exposed: No Muscle Exposed: No Joint Exposed: No Bone Exposed: No Periwound Skin Texture Texture Color No Abnormalities Noted: Yes No Abnormalities Noted: Yes Moisture Temperature / Pain No Abnormalities Noted: Yes Temperature: No Abnormality Treatment Notes Wound #8 (Foot) Wound Laterality: Dorsal, Left Cleanser Soap and Water Discharge Instruction: May shower and wash wound with dial antibacterial soap and water prior to dressing change. Peri-Wound Care Sween Lotion (Moisturizing lotion) Discharge Instruction: Apply moisturizing lotion as directed Topical Primary Dressing Maxorb Extra Ag+ Alginate Dressing, 2x2 (in/in) Discharge Instruction: Apply to wound bed as instructed Secondary Dressing Woven Gauze Sponge, Non-Sterile 4x4 in Discharge Instruction: Apply over primary dressing as directed. Secured With [employer] ([id number]) [id number] Page 12 of 13 Compression Wrap ThreePress (3 layer compression wrap) Discharge Instruction: Apply three layer compression as directed. Compression Stockings Add-Ons Electronic Signature(s) Signed: 04/01/2023 1:19:41 PM By: Zenaida Deed RN, BSN Entered By: Zenaida Deed on 04/01/2023  07:56:34 -------------------------------------------------------------------------------- Wound Assessment Details Patient Name: Date of Service: MAXIMIANO, SCHIPANI 04/01/2023 10:15 A M Medical Record Number: [id number] Patient Account Number: [id number] Date of Birth/Sex: Treating RN: [birth year] (72 y.o. [name] Primary Care Jenette Rayson: [name] Other Clinician: Referring Muscab Brenneman: Treating Jaser Fullen/Extender: [name] in Treatment: 59 Wound Status Wound Number: [id number] Primary Lymphedema Etiology: Wound Location: Left, Lateral Lower Leg Wound Open Wounding Event: Gradually Appeared Status: Date Acquired: 03/22/2023 Comorbid Cataracts, Anemia, Lymphedema, Congestive Heart Failure, Deep Weeks Of Treatment: 0 History: Vein Thrombosis, Hypertension, Peripheral Venous Disease Clustered Wound: No Photos Wound Measurements Length: (cm) 2 Width: (cm) 1.8 Depth: (cm) 0.1 Area: (cm) 2.827 Volume: (cm) 0.283 % Reduction in Area: % Reduction in Volume: Epithelialization: Small (1-33%) Tunneling: No Undermining: No Wound Description Classification: Full Thickness Without Exposed Support Structures Wound Margin: Flat and Intact Exudate Amount: Medium Exudate Type: Purulent Exudate Color: yellow, brown, green Foul Odor After Cleansing: No Slough/Fibrino Yes Wound Bed Granulation Amount: Medium (34-66%) Exposed Structure Granulation Quality: Red Fascia Exposed: No Necrotic Amount: Medium (34-66%) Fat Layer (Subcutaneous Tissue) Exposed: Yes Tendon Exposed: No Muscle Exposed: No Joint Exposed: No Bone Exposed: No LUCCA, BAKSHI ([id number]) [id number] Page 13 of 13 Periwound Skin Texture Texture Color No Abnormalities Noted: Yes No Abnormalities Noted: No Erythema: Yes Moisture Erythema Location: Circumferential No Abnormalities Noted: Yes Temperature / Pain Temperature: No Abnormality Electronic  Signature(s) Signed: 04/01/2023 1:19:41 PM By: Zenaida Deed RN, BSN Entered By: Zenaida Deed on 04/01/2023 07:58:22 -------------------------------------------------------------------------------- Vitals Details Patient Name: Date of Service: [name] 04/01/2023 10:15 A M Medical Record Number: [id number] Patient Account Number: [id number] Date of Birth/Sex: Treating RN: Mar 15, 1951 (72 y.o. [name] Primary Care Pamila Mendibles: [name] Other Clinician: Referring Keyuna Cuthrell: Treating Leandra Vanderweele/Extender: [name] in Treatment: 60 Vital Signs Time Taken: 10:29 Temperature (F): 97.8 Height (in): 71 Pulse (bpm): 62 Weight (lbs): 267 Respiratory Rate (breaths/min): 18 Body Mass Index (BMI): 37.2 Blood Pressure (mmHg): 154/80 Reference Range: 80 - 120 mg / dl Electronic Signature(s) Signed: 04/01/2023 1:19:41 PM By: Zenaida Deed RN, BSN Entered By: Zenaida Deed on 04/01/2023 07:30:10

## 2023-04-01 NOTE — Progress Notes (Signed)
[name], [name] ([id number]) [id number] Page 1 of 18 Visit Report for 04/01/2023 Chief Complaint Document Details Patient Name: Date of Service: [name], [name] 04/01/2023 10:15 A M Medical Record Number: [id number] Patient Account Number: [id number] Date of Birth/Sex: Treating RN: [birth year] (72 y.o. [name] Primary Care Provider: Nicoletta [name] Other Clinician: Referring Provider: Treating Provider/Extender: [name] in Treatment: 26 Information Obtained from: Patient Chief Complaint Patient seen for complaints of Non-Healing Wounds. Electronic Signature(s) Signed: 04/01/2023 11:58:59 AM By: [name] Guess MD FACS Entered By: [name] on 04/01/2023 08:58:59 -------------------------------------------------------------------------------- Debridement Details Patient Name: Date of Service: [name] 04/01/2023 10:15 A M Medical Record Number: [id number] Patient Account Number: [id number] Date of Birth/Sex: Treating RN: 1951-05-03 (72 y.o. [name] Primary Care Provider: Nicoletta [name] Other Clinician: Referring Provider: Treating Provider/Extender: [name] in Treatment: 59 Debridement Performed for Assessment: Wound #3 Right,Medial Lower Leg Performed By: Physician [name] Guess, MD Debridement Type: Debridement Level of Consciousness (Pre-procedure): Awake and Alert Pre-procedure Verification/Time Out Yes - 11:20 Taken: Start Time: 11:22 Pain Control: Lidocaine 4% T opical Solution Percent of Wound Bed Debrided: 75% T Area Debrided (cm): otal 30.39 Tissue and other material debrided: Viable, Non-Viable, Slough, Subcutaneous, Slough, Other: calcium deposit Level: Skin/Subcutaneous Tissue Debridement Description: Excisional Instrument: Curette Bleeding: Minimum Hemostasis Achieved: Pressure Procedural Pain: 3 Post Procedural Pain: 2 Response to Treatment:  Procedure was tolerated well Level of Consciousness (Post- Awake and Alert procedure): Post Debridement Measurements of Total Wound Length: (cm) 8.9 Width: (cm) 5.8 Depth: (cm) 0.4 Volume: (cm) 16.217 Character of Wound/Ulcer Post Debridement: Improved Post Procedure Diagnosis [name], [name] ([id number]) [id number] Page 2 of 18 Same as Pre-procedure Notes scribed for Dr. [name] by [name] Deed, RN Electronic Signature(s) Signed: 04/01/2023 12:16:08 PM By: [name] Guess MD FACS Signed: 04/01/2023 1:19:41 PM By: [name] Deed RN, BSN Entered By: [name] on 04/01/2023 08:27:45 -------------------------------------------------------------------------------- Debridement Details Patient Name: Date of Service: [name], [name] 04/01/2023 10:15 A M Medical Record Number: [id number] Patient Account Number: [id number] Date of Birth/Sex: Treating RN: [birth year] (72 y.o. [name] Primary Care Provider: Nicoletta [name] Other Clinician: Referring Provider: Treating Provider/Extender: [name] in Treatment: 59 Debridement Performed for Assessment: Wound #6 Right,Anterior Lower Leg Performed By: Physician [name] Guess, MD Debridement Type: Debridement Level of Consciousness (Pre-procedure): Awake and Alert Pre-procedure Verification/Time Out Yes - 11:20 Taken: Start Time: 11:22 Pain Control: Lidocaine 4% T opical Solution Percent of Wound Bed Debrided: 50% T Area Debrided (cm): otal 0.16 Tissue and other material debrided: Non-Viable, Slough, Slough Level: Non-Viable Tissue Debridement Description: Selective/Open Wound Instrument: Curette Bleeding: Minimum Hemostasis Achieved: Pressure Procedural Pain: 0 Post Procedural Pain: 0 Response to Treatment: Procedure was tolerated well Level of Consciousness (Post- Awake and Alert procedure): Post Debridement Measurements of Total Wound Length: (cm) 1.4 Width:  (cm) 0.3 Depth: (cm) 0.2 Volume: (cm) 0.066 Character of Wound/Ulcer Post Debridement: Improved Post Procedure Diagnosis Same as Pre-procedure Notes scribed for Dr. [name] by [name] Deed, RN Electronic Signature(s) Signed: 04/01/2023 12:16:08 PM By: [name] Guess MD FACS Signed: 04/01/2023 1:19:41 PM By: [name] Deed RN, BSN Entered By: [name] on 04/01/2023 08:29:00 [name] ([id number]) [id number] Page 3 of 18 -------------------------------------------------------------------------------- Debridement Details Patient Name: Date of Service: [name], [name] 04/01/2023 10:15 A M Medical Record Number: [id number] Patient Account Number: [id number] Date of Birth/Sex: Treating RN: 12-Apr-1951 (72 y.o. [name] Primary Care Provider: Nicoletta [name] Other Clinician: Referring Provider: Treating Provider/Extender: [name]  [name] in Treatment: 59 Debridement Performed for Assessment: Wound #8 Left,Dorsal Foot Performed By: Physician [name] Guess, MD Debridement Type: Debridement Level of Consciousness (Pre-procedure): Awake and Alert Pre-procedure Verification/Time Out Yes - 11:20 Taken: Start Time: 11:22 Pain Control: Lidocaine 4% T opical Solution Percent of Wound Bed Debrided: 100% T Area Debrided (cm): otal 1.12 Tissue and other material debrided: Non-Viable, Slough, Slough Level: Non-Viable Tissue Debridement Description: Selective/Open Wound Instrument: Curette Bleeding: Minimum Hemostasis Achieved: Pressure Procedural Pain: 3 Post Procedural Pain: 2 Response to Treatment: Procedure was tolerated well Level of Consciousness (Post- Awake and Alert procedure): Post Debridement Measurements of Total Wound Length: (cm) 1.1 Width: (cm) 1.3 Depth: (cm) 0.1 Volume: (cm) 0.112 Character of Wound/Ulcer Post Debridement: Improved Post Procedure Diagnosis Same as Pre-procedure Notes scribed for Dr.  [name] by [name] Deed, RN Electronic Signature(s) Signed: 04/01/2023 12:16:08 PM By: [name] Guess MD FACS Signed: 04/01/2023 1:19:41 PM By: [name] Deed RN, BSN Entered By: [name] on 04/01/2023 08:31:50 -------------------------------------------------------------------------------- HPI Details Patient Name: Date of Service: [name] 04/01/2023 10:15 A M Medical Record Number: [id number] Patient Account Number: [id number] Date of Birth/Sex: Treating RN: 07-10-1951 (72 y.o. [name] Primary Care Provider: Nicoletta [name] Other Clinician: Referring Provider: Treating Provider/Extender: [name] in Treatment: 51 History of Present Illness HPI Description: ADMISSION 02/09/2022 This is a 72 year old man who recently moved to [place] from [place]. He has a history of of coronary artery disease and prior left popliteal vein DVT . He is not diabetic and does not smoke. 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 He has had multiple spinal surgeries and has limited mobility. He primarily uses a motorized wheelchair. He has had lymphedema for many years and does have lymphedema pumps although he says he does not use them frequently. He has wounds on his bilateral lower extremities. He was receiving care in [place] for these prior to his move. They were apparently packing his wounds with Betadine soaked gauze. He has worn compression wraps in the past but not recently. He did say that they helped tremendously. In clinic today, his right ABI is noncompressible but has good signals; the left ABI was 1.17. No formal vascular studies have been performed. FLINT, ETTER ([id number]) [id number] Page 4 of 18 On his left dorsal foot, there is a circular lesion that exposes the fat layer. There is fairly heavy slough accumulation within the wound, but no significant odor. On his right medial calf, he has  multiple pinholes that have slough buildup in them and probe for about 2 to 4 mm in depth. They are draining clear fluid. On his right lateral midfoot, he has ulceration consistent with venous stasis. It is geographic and has slough accumulation. 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 02/17/2022: All of the wounds are little bit larger today and have more slough accumulation. Today, the medial calf openings are larger and probe to something that feels calcified. There is odor coming from his wounds. His vascular studies are scheduled for August 9 and 10. 02-24-2022 upon evaluation today patient presents for follow-up concerning his ongoing issues here with his lower extremities. With that being said he does have significant problems with what appears to be cellulitis unfortunately the PCR culture that was sent last week got crushed by UPS in transit. With that being said we are going to reobtain a culture today although  I am actually to do it on the right medial leg as this seems to be the most inflamed area today where there is some questionable drainage as well. I am going to remove some of the calcium deposits which are loosening obtain a deeper culture from this area. Fortunately I do not see any evidence of active infection systemically which is good news but locally I think we are having a bigger issue here. He does have his appointment next Thursday with vascular. Patient has also been referred to [facility] for further evaluation and treatment of the calcinosis for possible sulfate injections. 03/04/2022: The culture that was performed last week grew out multiple species including Klebsiella, Morganella, and Pseudomonas aeruginosa. He had been prescribed Bactrim but this was not adequate to cover all of the species and levofloxacin was recommended. He completed the Bactrim but did not pick up  the levofloxacin and begin taking it until yesterday. 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 He had arterial studies performed today. He does have evidence of peripheral arterial disease. Results copied here: +-------+-----------+-----------+------------+------------+ ABI/TBIT oday's ABIT oday's TBIPrevious ABIPrevious TBI +-------+-----------+-----------+------------+------------+ Right [place] 0.75    +-------+-----------+-----------+------------+------------+ Left [place] 0.57    +-------+-----------+-----------+------------+------------+ Arterial wall calcification precludes accurate ankle pressures and ABIs. Summary: Right: Resting right ankle-brachial index indicates noncompressible right lower extremity arteries. The right toe-brachial index is normal. Left: Resting left ankle-brachial index indicates noncompressible left lower extremity arteries. The left toe-brachial index is abnormal. He continues to have further tissue breakdown at the right medial calf and there is ample slough accumulation along with necrotic subcutaneous tissue. The left dorsal foot wound has built up slough, as well. The right medial foot wound is nearly closed with just a light layer of eschar over the surface. The right dorsal lateral foot wound has also accumulated a fair amount of slough and remains quite tender. 03/10/2022: He has been taking the prescribed levofloxacin and has about 3 days left of this. The erythema and induration on his right leg has improved. He continues to experience further breakdown of the right medial calf wound. There is extensive slough and debris accumulation there. There is heavy slough on his left dorsal foot wound, but no odor or purulent drainage. The right medial foot wound appears to be closed. The  right dorsal lateral foot wound also has a fair amount of slough but it is less tender than at our last visit. He still does not have an appointment with dermatology. 03/22/2022: The right anterior tibial wound has closed. The right lateral foot wound is nearly closed with just a little bit of slough. The left dorsal foot wound is smaller and continues to have some slough buildup but less so than at prior visits. There is good granulation tissue underlying the slough. Unfortunately the right medial calf wound continues to deteriorate. It has a foul odor today and a lot of necrotic tissue, the surrounding skin is also erythematous and moist. 03/30/2022: The right lateral foot wound continues to contract and just has a little bit of slough and eschar present. The left dorsal foot wound is also smaller with slough overlying good granulation tissue. The right medial calf wound actually looks quite a bit better today; there is no odor and there is much less necrotic tissue although some remains present. We have been using topical gentamicin and he has  been taking oral Augmentin. 04/07/2022: The patient saw dermatology at [facility] last week. Unfortunately, it appears that they did not read any of the documentation that was provided. 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 Overall, it was a highly unsatisfactory consultation. In the meantime, however, we were able to identify compounding pharmacy who could create a topical sodium thiosulfate ointment. The patient has that with him today. Both of the right foot wounds are now closed. The left dorsal foot wound has contracted but still has a layer of slough on the surface. The medial calf  wounds on the right all look a little bit better. They still have heavy slough along with the chunks of calcium. Everything is stained purple. 04/14/2022: The wound on his dorsal left foot is a little bit smaller again today. There is still slough accumulation on the surface. The medial calf wounds have quite a bit less slough accumulation today. His right leg, however, is warm and erythematous, concerning for cellulitis. 04/14/2022: He completed his course of Augmentin yesterday. The medial calf wound sites are much cleaner today and shallower. There is less fragmented calcium in the wounds. He has a lot of lymphedema fluid-like drainage from these wounds, but no purulent discharge or malodor. The wound on his dorsal left foot has also contracted and is shallower. There is a layer of slough over healthy granulation tissue. 05/05/2022: The left dorsal foot wound is smaller today with less slough and more granulation tissue. The right medial calf wounds are shallower, but have extensive slough and some fat necrosis. The drainage has diminished considerably, however. 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 05/12/2022: The left dorsal foot wound continues to contract and fill with granulation tissue. There is slough on the wound surface. The right medial calf wounds also continue to fill with healthy tissue, but there is remaining slough and fat necrosis present. He had a significant increase in his drainage this week and it was a bright yellow-green color. He also had a new wound open over his right anterior tibial surface associated with the spike of cutaneous calcium. The leg is more red, as well, concerning for infection. 05/19/2022: His culture returned positive for Pseudomonas. He is currently taking a course of oral levofloxacin. We have also been using topical gentamicin mixed in  with his sodium thiosulfate. All of the wounds look better this week. 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 There is slough and nonviable subcutaneous tissue still present. The new anterior tibial wound just has a light layer of slough. The dorsal foot wound also has some slough present and appears a little dry today. 05/26/2022: The right medial leg wound continues to improve. It is feeling with granulation tissue, but still accumulates a fairly thick layer of slough on the surface. The more recent anterior tibial wound has remained quite small, but also has some slough on the surface. The left dorsal foot wound has contracted [name], [name] ([id number]) [id number] Page 5 of 18 somewhat and has perimeter epithelialization. He completed his oral levofloxacin. 06/02/2022: The left dorsal foot wound continues to improve significantly. There is just  a little slough on the wound surface. The right medial leg wound had black drainage, but it looks like silver alginate was applied last week and I theorized that silver sulfide was created with a combination of the silver alginate and the sodium thiosulfate. It did, however, have a bit of an odor to it and extensive slough accumulation. The small anterior tibial wound also had a bit of slough and nonviable subcutaneous tissue present. The skin of his leg was rather macerated, as well. 06/09/2022: The left dorsal foot wound is smaller again this week with just a light layer of slough on the surface. The right medial leg wound looks substantially better. The leg and periwound skin are no longer red and macerated. There is good granulation tissue forming with less slough and nonviable tissue. The anterior tibial wound just has a small amount of slough accumulation. 06/16/2022: The left dorsal foot wound continues to contract. His wrap slipped a little bit, however, and his foot is more swollen. The  right medial leg wound continues to improve. The underlying tissue is more vital-appearing and there is less slough. He does have substantial drainage. The small anterior tibial wound has a bit of slough accumulation. 06/23/2022: The left dorsal foot wound is about half the size as it was last week. There is just some light slough on the surface and some eschar buildup around the edges. The right anterior leg wound is small with a little slough on the surface. Unfortunately, the right medial leg wound deteriorated fairly substantially. It is malodorous with gray/green/black discoloration. 06/30/2022: The left dorsal foot wound continues to contract. It is nearly closed with just a light layer of slough present. The right anterior leg wound is about the same size and has a small amount of slough on the surface. The right medial leg wound looks a lot better than it did last week. The odor has abated and the tissue is less pulpy and necrotic-looking. There is still a fairly thick layer of slough present. 12/13; the left dorsal foot wound looks very healthy. There was no need to change the dressing here and no debridement. He has a small area on the right anterior lower leg apparently had not changed much in size. The area on the medial leg is the most problematic area this is large with some depth and a completely nonviable surface today. 07/14/2022: The left dorsal foot wound is smaller today with just a little slough and eschar present. The right anterior lower leg wound is about the same without any significant change. The medial leg wound has a black and green surface and is malodorous. No purulent drainage and the periwound skin is intact. Edema control is suboptimal. 07/21/2022: The dorsal foot wound continues to contract and is nearly closed with just a little bit of slough and eschar on the surface. The right anterior lower leg wound is shallower today but otherwise the dimensions are unchanged.  The medial leg wound looks significantly better although it still has a nonviable surface; the odor and black/green surface has resolved. His culture came back with multiple species sensitive to Augmentin, which was prescribed and a very low level of Pseudomonas, which should be handled by the topical gentamicin we have been using. 07/28/2022: The dorsal foot wound is down to just a very tiny opening with a little eschar on the surface. The right anterior tibial wound is about the same with some slough buildup. The right medial leg wound looks quite a bit better  this week. There is thick rubbery slough on the surface, but the odor and drainage has improved significantly. 08/04/2022: The dorsal foot wound is almost healed, but the periwound skin has broken down. This appears to be secondary to moisture and adhesive from the absorbent pad. The right anterior tibial wound is shallower. The right medial leg wound continues to improve. It is smaller, still with rubbery slough on the surface. No malodor or purulent drainage. 08/11/2022: The dorsal foot wound is about the same size. It is a little bit dry with some slough accumulation. The right anterior tibial wound is also unchanged. The right medial leg wound is filling in further with good granulation tissue. Still with some slough buildup on the surface. 08/18/2022: His left leg is bright red, hot, and swollen with cellulitis. The dorsal foot wound actually looks quite good and is superficial with just a little bit of slough accumulation. The right anterior tibial wound is unchanged. The right medial leg wound has filled in with good granulation tissue but has copious amounts of drainage. 08/25/2022: His cellulitis has responded nicely to the oral antibiotics. His dorsal foot wound is smaller but has a fair amount of slough buildup. The right anterior tibial wound remains stable and unchanged. The right medial leg wound has more granulation tissue under a  layer of slough, but continues to have copious drainage. The drainage is actually causing skin irritation and threatens to result in breakdown. 09/01/2022: He continues to have profuse drainage from his wounds which is resulting in tissue maceration on both the dorsum of his left foot and the periwound distal to his medial leg wound. The dorsal foot wound is nearly healed, despite this. The medial right leg wound is filling in with good granulation tissue. We are working on getting home health assistance to change his dressings more frequently to try and control the drainage better. 09/08/2022: We have had success in controlling his drainage. The tissue maceration has improved significantly and his swelling has come down quite markedly. The medial right leg wound is much more superficial and has substantially less nonviable tissue present. The anterior tibial wound is a little bit larger, however with some nonviable tissue present. 09/15/2022: The dorsal foot wound is nearly closed. The anterior tibial wound measures slightly larger today. The medial right leg wound continues to improve quite dramatically with less nonviable tissue and more robust-looking granulation. Edema control is significantly better. 09/22/2022: The left dorsal foot wound remains open, albeit quite tiny. The anterior tibial wound is a little bit larger again today. The medial right leg wound continues to improve with an increase in robust and healthy granulation tissue and less accumulation of nonviable tissue. 09/29/2022: His home health providers wrapped his left leg extremely tightly and he ended up having to cut the wrap off of his foot. This resulted in his foot being very swollen and that the dorsal foot wound is a little bit larger today. The anterior tibial wound is about the same size with a little slough on the surface. The medial right leg wound continues to fill in. There are very few areas with any significant depth. There  is still slough accumulation. 3/13; Patient has a left dorsal foot wound along with a right anterior tibial wound and right medial wound. We are using silver alginate to the left dorsal foot wound, Santyl and sodium thiosulfate compound ointment to the right lower extremity wounds under 4 layer compression. Patient has no issues or complaints today. 10/13/2022: The  left dorsal foot wound is a little bit bigger today, but his foot is also a little bit more swollen. The right medial leg wound has filled in more and has substantially less depth. There is still some nonviable tissue present. The small anterior lower leg wound looks about the same. 10/20/2022: The left dorsal foot wound is epithelializing. There does not appear to be much open at this site. There is some eschar around the edges. The right medial leg wound continues to fill in with granulation tissue. The small anterior lower leg wound is a little bit bigger and there is a chunk of calcium protruding. He continues to have fairly copious drainage from the right leg. 10/27/2022: His home health agency failed to show up at all this past week and as a result, the excessive drainage from his right leg wound has resulted in tissue breakdown. His leg is red and irritated. The wounds themselves look about the same. The wound on his left dorsal foot is nearly closed underneath some eschar. 11/03/2022: We continue to have difficulty with his home health agency. He continues to have significant drainage from his right leg wound which has resulted in even further tissue breakdown. He also was not taking his Lasix as prescribed and I think much of the drainage has been his lymphedema fluid choosing the path of least resistance. The wound on his left dorsal foot is closed, but in the process of cleaning the site, dry skin was pulled off and he has a new small superficial wound just adjacent to where the dorsal foot wound was. [name], [name] ([id number])  [id number] Page 6 of 18 11/12/2022: His left dorsal foot is completely healed. The right medial leg continues to deteriorate. He is not taking his Lasix as prescribed, nor is he using his lymphedema pumps at all. As a result, he has massive amounts of drainage saturating his dressings and causing further tissue breakdown on his medial leg. Today, his dressings had a foul odor, as well. 11/19/2022: His right medial leg looks even worse today. There is more erythema and moisture related tissue breakdown. The 2 anterior wounds look a little bit better. The culture that I took last week did produce Pseudomonas and he is currently taking levofloxacin for this. 11/26/2022: There has been significant improvement to his right medial leg. It is far less raw and many of the superficial open areas have epithelialized. He continues to cut the foot portion of his wrap and as result his foot is bulbous and swollen compared to the rest of his leg, where edema control is good. The smaller of the 2 anterior tibial wound is closed, while the larger is contracting somewhat with a bit of slough at the base. 12/03/2022: His leg continues to demonstrate improvement. There has been epithelialization of much of the denuded area. He has a new small anterior tibial wound that has opened around a nidus of calcium. Edema control is improved. 12/10/2022: Continued improvement in the periwound skin. The anterior tibial wounds are about the same, but the medial leg wound has improved further. There is less nonviable tissue present. 12/17/2022: Between his visit on Tuesday with the nurse and today, he has had significantly more drainage which has resulted in some periwound tissue maceration. I suspect this correlates somewhat with his diuretic use, as he says he takes his diuretic religiously on Saturday, Sunday, and Monday and then does not take it after Tuesday due to being out and about and not being close  to easy restroom access. I suspect the excess fluid is just simply draining out of his leg. 12/21/2022: This was supposed to be a nurse visit, but his leg has broken down so significantly, that we have converted to a wound care visit. He did take his diuretics over the weekend, but despite this, he has had copious drainage and all of his tissues have broken down substantially from the right medial leg ulcer all the way down to his ankle and even wrapping posterior to this. There is blue-green staining on his dressing, suggesting ongoing presence of Pseudomonas aeruginosa. 12/24/2022: The wound already looks better than it did 2 days ago. He has been taking ciprofloxacin and the Urgo clean dressing we applied did a much better job of wicking the drainage away from his leg. There is slough on the entire surface of the medial leg wound, but the erythema and induration has improved in the past couple of days. 12/31/2022: Continued improvement of the wound. He says that the pain has basically abated since being on ciprofloxacin. Still with fairly heavy drainage and slough accumulation. 01/07/2023: Overall continued improvement. Still with heavy slough buildup, but less drainage. 01/14/2023: There is a little bit of improvement. The skin is still looking a bit excoriated. He has a few more days left of ciprofloxacin. 01/21/2023: There has been significant improvement. The periwound skin is in much better condition. The ulcers are shallower and actually have some good granulation tissue present under some slough. 01/28/2023: The wound continues to improve. All of the open areas measured smaller today. There is slough accumulation on the surfaces. His drainage has decreased markedly. 02/04/2023: Continued improvement in the wound. Drainage remains significantly diminished. Slough accumulation is present but to a lesser extent. Periwound skin continues to improve. Edema control is good. 02/18/2023: The heavy drainage  persists, although it is was not as significant today as it was at his nurse visit on Tuesday. The drainage is yellow and does not have the blue-green discoloration suggestive of Pseudomonas. The actual wounds on his leg are getting smaller, but the periwound skin is quite excoriated. 02/25/2023: His periwound skin is quite excoriated and he has a lot of clear yellow drainage. The main wounds are filling in further and epithelializing. 03/04/2023: The excoriation of the periwound skin has improved. Most of that area has dried up. The main wounds have less slough in them today. 03/11/2023: The main wounds have accumulated a little bit of slough. The periwound excoriation continues to improve. Unfortunately, he suffered a fall over the previous weekend and has abrasions on his knees bilaterally and on his right wrist and upper arm. In addition, the skin on his left lower leg apparently blistered and opened, leaving several superficial wounds on this extremity. He has been approved for the [facility] and Press wound VAC and has the equipment with him today. 03/18/2023: He has extensive excoriation to the periwound skin on the right medial lower leg. The actual wounds themselves are smaller with slough accumulation. The top of his left foot has begun weeping again. Using the [facility] and Press wound VAC, he says that he through 4 canisters. He seems to be very fluid overloaded, without any significant dyspnea. 03/25/2023: The wound to his left anterior tibial surface has healed. The left dorsal foot wound has gotten smaller and has a small area of hypertrophic granulation tissue present. The periwound skin on the right medial lower leg looks slightly better, but still appears excoriated. The actual wounds continue to contract  but are still accumulating quite a bit of slough. The tiny right anterior leg wounds are contracting. 04/01/2023: The left dorsal foot wound got a little bit bigger this week. There is slough  accumulation. The periwound skin on the right medial lower leg continues to improve. The main medial right lower leg wounds have a dark gray discoloration and there is a strong odor coming from them. The back of his right lower leg just above the ankle also has a little bit of new breakdown where he is draining edema fluid. The small anterior leg wounds are stable. Electronic Signature(s) Signed: 04/01/2023 12:00:18 PM By: [name] Guess MD FACS Entered By: [name] on 04/01/2023 09:00:18 -------------------------------------------------------------------------------- Physical Exam Details Patient Name: Date of Service: [name] 04/01/2023 10:15 A M Medical Record Number: [id number] Patient Account Number: [id number] UMUT, KIZZIRE ([id number]) [id number] Page 7 of 18 Date of Birth/Sex: Treating RN: 1951/01/23 (72 y.o. [name] Primary Care Provider: Other Clinician: Nicoletta [name] Referring Provider: Treating Provider/Extender: [name] in Treatment: 24 Constitutional Hypertensive, asymptomatic. . . . no acute distress. Respiratory Normal work of breathing on room air. Notes 04/01/2023: The left dorsal foot wound got a little bit bigger this week. There is slough accumulation. The periwound skin on the right medial lower leg continues to improve. The main medial right lower leg wounds have a dark gray discoloration and there is a strong odor coming from them. The back of his right lower leg just above the ankle also has a little bit of new breakdown where he is draining edema fluid. The small anterior leg wounds are stable. Electronic Signature(s) Signed: 04/01/2023 12:01:22 PM By: [name] Guess MD FACS Entered By: [name] on 04/01/2023 09:01:22 -------------------------------------------------------------------------------- Physician Orders Details Patient Name: Date of Service: [name]  04/01/2023 10:15 A M Medical Record Number: [id number] Patient Account Number: [id number] Date of Birth/Sex: Treating RN: 13-Feb-1951 (72 y.o. 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 - Dr. [name] RM 1 Return A Friday 9/13 @ 11:45 am Nurse Visit: - Tues. 9/10 @ 3:00 pm Anesthetic (In clinic) Topical Lidocaine 4% applied to wound bed Bathing/ Shower/ Hygiene May shower with protection but do not get wound dressing(s) wet. Protect dressing(s) with water repellant cover (for example, large plastic bag) or a cast cover and may then take shower. Negative Presssure Wound Therapy Wound Vac to wound continuously at 143mm/hg pressure Black Foam - peel and place Edema Control - Lymphedema / SCD / Other Lymphedema Pumps. Use Lymphedema pumps on leg(s) 2-3 times a day for 45-60 minutes. If wearing any wraps or hose, do not remove them. Continue exercising as instructed. Elevate legs to the level of the heart or above for 30 minutes daily and/or when sitting for 3-4 times a day throughout the day. - throughout the day Avoid standing for long periods of time. Exercise regularly [name], [name] ([id number]) [id number] Page 8 of  18 Wound  Treatment Wound #3 - Lower Leg Wound Laterality: Right, Medial Peri-Wound Care: Skin Prep 1 x Per Week/30 Days Discharge Instructions: cavelon to macerated wound margins Peri-Wound Care: Sween Lotion (Moisturizing lotion) 1 x Per Week/30 Days Discharge Instructions: Apply moisturizing lotion as directed Topical: Gentamicin 1 x Per Week/30 Days Discharge Instructions: in lager holes only Prim Dressing: Maxorb Extra Ag+ Alginate Dressing, 4x4.75 (in/in) 1 x Per Week/30 Days ary Discharge Instructions: Apply toexcoriated weeping areas Prim Dressing: peel and place vac ary 1 x Per Week/30 Days Compression Wrap: ThreePress (3 layer compression wrap) 1 x Per Week/30 Days Discharge Instructions: Apply three layer compression as directed. Wound #6 - Lower Leg Wound Laterality: Right, Anterior Peri-Wound Care: Sween Lotion (Moisturizing lotion) 1 x Per Week/30 Days Discharge Instructions: Apply moisturizing lotion as directed Prim Dressing: peel and place vac ary 1 x Per Week/30 Days Compression Wrap: ThreePress (3 layer compression wrap) 1 x Per Week/30 Days Discharge Instructions: Apply three layer compression as directed. Wound #8 - Foot Wound Laterality: Dorsal, Left Cleanser: Soap and Water 1 x Per Week/30 Days Discharge Instructions: May shower and wash wound with dial antibacterial soap and water prior to dressing change. Peri-Wound Care: Sween Lotion (Moisturizing lotion) 1 x Per Week/30 Days Discharge Instructions: Apply moisturizing lotion as directed Prim Dressing: Maxorb Extra Ag+ Alginate Dressing, 2x2 (in/in) 1 x Per Week/30 Days ary Discharge Instructions: Apply to wound bed as instructed Secondary Dressing: Woven Gauze Sponge, Non-Sterile 4x4 in 1 x Per Week/30 Days Discharge Instructions: Apply over primary dressing as directed. Compression Wrap: ThreePress (3 layer compression wrap) 1 x Per Week/30 Days Discharge Instructions: Apply three layer compression as  directed. Wound #9 - Lower Leg Wound Laterality: Right, Lateral Cleanser: Soap and Water 1 x Per Week/30 Days Discharge Instructions: May shower and wash wound with dial antibacterial soap and water prior to dressing change. Peri-Wound Care: Sween Lotion (Moisturizing lotion) 1 x Per Week/30 Days Discharge Instructions: Apply moisturizing lotion as directed Prim Dressing: Maxorb Extra Ag+ Alginate Dressing, 2x2 (in/in) 1 x Per Week/30 Days ary Discharge Instructions: Apply to wound bed as instructed Secondary Dressing: Woven Gauze Sponge, Non-Sterile 4x4 in 1 x Per Week/30 Days Discharge Instructions: Apply over primary dressing as directed. Compression Wrap: ThreePress (3 layer compression wrap) 1 x Per Week/30 Days Discharge Instructions: Apply three layer compression as directed. Patient Medications llergies: No Known Allergies A Notifications Medication Indication Start End 04/01/2023 levofloxacin DOSE oral 750 mg tablet - 1 tab p.o. daily x 14 days Electronic Signature(s) Signed: 04/01/2023 12:16:08 PM By: [name] Guess MD FACS Previous Signature: 04/01/2023 12:02:50 PM Version By: [name] Guess MD FACS [place], [name] ([id number]) [id number] Page 9 of 18 Entered By: [name] on 04/01/2023 09:03:05 -------------------------------------------------------------------------------- Problem List Details Patient Name: Date of Service: [name], [name] 04/01/2023 10:15 A M Medical Record Number: [id number] Patient Account Number: [id number] Date of Birth/Sex: Treating RN: 20-Jan-1951 (72 y.o. 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) 02/09/2022 No Yes I89.0 Lymphedema, not  elsewhere classified 02/09/2022 No Yes I10 Essential (primary) hypertension 02/09/2022 No Yes I25.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 angina pectoris 02/09/2022 No Yes Inactive Problems ICD-10 Code Description Active Date Inactive Date L97.512 Non-pressure chronic ulcer of other part of right foot with fat layer exposed 02/17/2022 02/17/2022 Resolved Problems ICD-10 Code Description Active Date Resolved Date L97.821 Non-pressure chronic ulcer of other part of left lower leg limited to breakdown of skin 03/11/2023 03/11/2023 L98.492 Non-pressure chronic ulcer of skin of other sites with fat layer exposed 03/11/2023 03/11/2023 [name] ([id number]) [id number] Page 10 of 18 Electronic Signature(s) Signed: 04/01/2023 11:58:40 AM By: [name] Guess MD FACS Entered By: [name] on 04/01/2023 08:58:40 -------------------------------------------------------------------------------- Progress Note Details Patient Name: Date of Service: [name] 04/01/2023 10:15 A M Medical Record Number: [id number] Patient Account Number: [id number] Date of Birth/Sex: Treating RN: 1951-02-25 (72 y.o. [name] Primary Care Provider: Nicoletta [name] Other Clinician: Referring Provider: Treating Provider/Extender: [name] in Treatment: 49 Subjective Chief Complaint Information obtained from Patient Patient seen for complaints of Non-Healing Wounds. History of Present Illness (HPI) ADMISSION 02/09/2022 This is a 72 year old man who recently moved to [place] from [place].  He has a history of of coronary artery disease and prior left popliteal vein DVT . He is not diabetic and does not smoke. 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 He has had multiple spinal surgeries and has limited mobility. He primarily uses a motorized wheelchair. He has had lymphedema for many years and does have lymphedema pumps although he says he does not use them frequently. He has wounds on his bilateral lower extremities. He was receiving care in [place] for these prior to his move. They were apparently packing his wounds with Betadine soaked gauze. He has worn compression wraps in the past but not recently. He did say that they helped tremendously. In clinic today, his right ABI is noncompressible but has good signals; the left ABI was 1.17. No formal vascular studies have been performed. On his left dorsal foot, there is a circular lesion that exposes the fat layer. There is fairly heavy slough accumulation within the wound, but no significant odor. On his right medial calf, he has multiple pinholes that have slough buildup in them and probe for about 2 to 4 mm in depth. They are draining clear fluid. On his right lateral midfoot, he has ulceration consistent with venous stasis. It is geographic and has slough accumulation. 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 02/17/2022: All of the wounds are little bit larger today and have more slough accumulation. Today, the medial calf openings are larger and probe to something that feels calcified. There is odor coming from his wounds. His vascular studies are scheduled for August 9 and 10. 02-24-2022 upon evaluation today patient presents for follow-up concerning his ongoing issues here with his lower extremities. With that being said he does have significant problems with what appears to be cellulitis unfortunately the PCR culture that was sent  last week got crushed by [employer] in transit. With that being said we are going to reobtain a culture today although I am actually to do it on the right medial leg as this seems to be the most inflamed area today where there is some questionable drainage as well. I am going to remove some of the calcium deposits which are loosening obtain a  deeper culture from this area. Fortunately I do not see any evidence of active infection systemically which is good news but locally I think we are having a bigger issue here. He does have his appointment next Thursday with vascular. Patient has also been referred to [facility] for further evaluation and treatment of the calcinosis for possible sulfate injections. 03/04/2022: The culture that was performed last week grew out multiple species including Klebsiella, Morganella, and Pseudomonas aeruginosa. He had been prescribed Bactrim but this was not adequate to cover all of the species and levofloxacin was recommended. He completed the Bactrim but did not pick up the levofloxacin and begin taking it until yesterday. 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 He had arterial studies performed today. He does have evidence of peripheral arterial disease. Results copied here: +-------+-----------+-----------+------------+------------+ ABI/TBIT oday's ABIT oday's TBIPrevious ABIPrevious TBI +-------+-----------+-----------+------------+------------+ Right [place] 0.75    +-------+-----------+-----------+------------+------------+ Left [place] 0.57    +-------+-----------+-----------+------------+------------+ Arterial wall calcification precludes accurate ankle pressures and ABIs. Summary: Right: Resting right ankle-brachial index indicates noncompressible right lower extremity arteries. The  right toe-brachial index is normal. Left: Resting left ankle-brachial index indicates noncompressible left lower extremity arteries. The left toe-brachial index is abnormal. He continues to have further tissue breakdown at the right medial calf and there is ample slough accumulation along with necrotic subcutaneous tissue. The left dorsal foot wound has built up slough, as well. The right medial foot wound is nearly closed with just a light layer of eschar over the surface. The right dorsal JAWON, ALVIDREZ ([id number]) [id number] Page 11 of 18 lateral foot wound has also accumulated a fair amount of slough and remains quite tender. 03/10/2022: He has been taking the prescribed levofloxacin and has about 3 days left of this. The erythema and induration on his right leg has improved. He continues to experience further breakdown of the right medial calf wound. There is extensive slough and debris accumulation there. There is heavy slough on his left dorsal foot wound, but no odor or purulent drainage. The right medial foot wound appears to be closed. The right dorsal lateral foot wound also has a fair amount of slough but it is less tender than at our last visit. He still does not have an appointment with dermatology. 03/22/2022: The right anterior tibial wound has closed. The right lateral foot wound is nearly closed with just a little bit of slough. The left dorsal foot wound is smaller and continues to have some slough buildup but less so than at prior visits. There is good granulation tissue underlying the slough. Unfortunately the right medial calf wound continues to deteriorate. It has a foul odor today and a lot of necrotic tissue, the surrounding skin is also erythematous and moist. 03/30/2022: The right lateral foot wound continues to contract and just has a little bit of slough and eschar present. The left dorsal foot wound is also smaller with slough overlying good  granulation tissue. The right medial calf wound actually looks quite a bit better today; there is no odor and there is much less necrotic tissue although some remains present. We have been using topical gentamicin and he has been taking oral Augmentin. 04/07/2022: The patient saw dermatology at [facility] last week. Unfortunately, it appears that they did not read any of the documentation that was provided. 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 Overall, it was a highly unsatisfactory consultation. In the meantime, however, we were able to identify compounding pharmacy who could create a topical sodium thiosulfate ointment. The patient has that with him today. Both of the right foot wounds are now closed. The left dorsal foot wound has contracted but still has a layer of slough on the surface. The medial calf wounds on the right all look a little bit better. They still have heavy slough along with the chunks of calcium. Everything is stained purple. 04/14/2022: The wound on his dorsal left foot is a little bit smaller again today. There is still slough accumulation on the surface. The medial calf wounds have quite a bit less slough accumulation today. His right leg, however, is warm and erythematous, concerning for cellulitis. 04/14/2022: He completed his course of Augmentin yesterday. The medial calf wound sites are much cleaner today and shallower. There is less fragmented calcium in the wounds. He has a lot of lymphedema fluid-like drainage from these wounds, but no purulent discharge or malodor. The wound on his dorsal left foot has also contracted and is shallower. There is a layer of slough over healthy granulation  tissue. 05/05/2022: The left dorsal foot wound is smaller today with less slough and more granulation tissue. The right medial calf wounds are shallower, but have extensive slough and some fat necrosis. The drainage has diminished considerably, however. 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 05/12/2022: The left dorsal foot wound continues to contract and fill with granulation tissue. There is slough on the wound surface. The right medial calf wounds also continue to fill with healthy tissue, but there is remaining slough and fat necrosis present. He had a significant increase in his drainage this week and it was a bright yellow-green color. He also had a new wound open over his right anterior tibial surface associated with the spike of cutaneous calcium. The leg is more red, as well, concerning for infection. 05/19/2022: His culture returned positive for Pseudomonas. He is currently taking a course of oral levofloxacin. We have also been using topical gentamicin mixed in with his sodium thiosulfate. All of the wounds look better this week. 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 There is slough and nonviable subcutaneous tissue still present. The new anterior tibial wound just has a light layer of slough. The dorsal foot wound also has some slough present and appears a little dry today. 05/26/2022: The right medial leg wound continues to improve. It is feeling with granulation tissue, but still accumulates a fairly thick layer of slough on the surface. The more recent anterior tibial wound has remained quite small, but also has some slough on the surface. The left dorsal foot wound has contracted somewhat and has perimeter epithelialization. He completed his oral levofloxacin. 06/02/2022: The left dorsal foot wound continues to improve  significantly. There is just a little slough on the wound surface. The right medial leg wound had black drainage, but it looks like silver alginate was applied last week and I theorized that silver sulfide was created with a combination of the silver alginate and the sodium thiosulfate.  It did, however, have a bit of an odor to it and extensive slough accumulation. The small anterior tibial wound also had a bit of slough and nonviable subcutaneous tissue present. The skin of his leg was rather macerated, as well. 06/09/2022: The left dorsal foot wound is smaller again this week with just a light layer of slough on the surface. The right medial leg wound looks substantially better. The leg and periwound skin are no longer red and macerated. There is good granulation tissue forming with less slough and nonviable tissue. The anterior tibial wound just has a small amount of slough accumulation. 06/16/2022: The left dorsal foot wound continues to contract. His wrap slipped a little bit, however, and his foot is more swollen. The right medial leg wound continues to improve. The underlying tissue is more vital-appearing and there is less slough. He does have substantial drainage. The small anterior tibial wound has a bit of slough accumulation. 06/23/2022: The left dorsal foot wound is about half the size as it was last week. There is just some light slough on the surface and some eschar buildup around the edges. The right anterior leg wound is small with a little slough on the surface. Unfortunately, the right medial leg wound deteriorated fairly substantially. It is malodorous with gray/green/black discoloration. 06/30/2022: The left dorsal foot wound continues to contract. It is nearly closed with just a light layer of slough present. The right anterior leg wound is about the same size and has a small amount of slough on the surface. The right medial leg wound looks a lot better than it did last week. The  odor has abated and the tissue is less pulpy and necrotic-looking. There is still a fairly thick layer of slough present. 12/13; the left dorsal foot wound looks very healthy. There was no need to change the dressing here and no debridement. He has a small area on the right anterior lower leg apparently had not changed much in size. The area on the medial leg is the most problematic area this is large with some depth and a completely nonviable surface today. 07/14/2022: The left dorsal foot wound is smaller today with just a little slough and eschar present. The right anterior lower leg wound is about the same without any significant change. The medial leg wound has a black and green surface and is malodorous. No purulent drainage and the periwound skin is intact. Edema control is suboptimal. 07/21/2022: The dorsal foot wound continues to contract and is nearly closed with just a little bit of slough and eschar on the surface. The right anterior lower leg wound is shallower today but otherwise the dimensions are unchanged. The medial leg wound looks significantly better although it still has a nonviable surface; the odor and black/green surface has resolved. His culture came back with multiple species sensitive to Augmentin, which was prescribed and a very low level of Pseudomonas, which should be handled by the topical gentamicin we have been using. 07/28/2022: The dorsal foot wound is down to just a very tiny opening with a little eschar on the surface. The right anterior tibial wound is about the same with some slough buildup. The right medial leg wound looks quite a bit better this week. There is thick rubbery slough on the surface, but the odor and drainage has improved significantly. 08/04/2022: The dorsal foot wound is almost healed, but the periwound skin has broken down. This appears to be secondary to moisture and adhesive from the absorbent  pad. The right anterior tibial wound is shallower.  The right medial leg wound continues to improve. It is smaller, still with rubbery slough on the surface. No malodor or purulent drainage. 08/11/2022: The dorsal foot wound is about the same size. It is a little bit dry with some slough accumulation. The right anterior tibial wound is also unchanged. The right medial leg wound is filling in further with good granulation tissue. Still with some slough buildup on the surface. [name], [name] ([id number]) [id number] Page 12 of 18 08/18/2022: His left leg is bright red, hot, and swollen with cellulitis. The dorsal foot wound actually looks quite good and is superficial with just a little bit of slough accumulation. The right anterior tibial wound is unchanged. The right medial leg wound has filled in with good granulation tissue but has copious amounts of drainage. 08/25/2022: His cellulitis has responded nicely to the oral antibiotics. His dorsal foot wound is smaller but has a fair amount of slough buildup. The right anterior tibial wound remains stable and unchanged. The right medial leg wound has more granulation tissue under a layer of slough, but continues to have copious drainage. The drainage is actually causing skin irritation and threatens to result in breakdown. 09/01/2022: He continues to have profuse drainage from his wounds which is resulting in tissue maceration on both the dorsum of his left foot and the periwound distal to his medial leg wound. The dorsal foot wound is nearly healed, despite this. The medial right leg wound is filling in with good granulation tissue. We are working on getting home health assistance to change his dressings more frequently to try and control the drainage better. 09/08/2022: We have had success in controlling his drainage. The tissue maceration has improved significantly and his swelling has come down quite markedly. The medial right leg wound is much more superficial and has substantially  less nonviable tissue present. The anterior tibial wound is a little bit larger, however with some nonviable tissue present. 09/15/2022: The dorsal foot wound is nearly closed. The anterior tibial wound measures slightly larger today. The medial right leg wound continues to improve quite dramatically with less nonviable tissue and more robust-looking granulation. Edema control is significantly better. 09/22/2022: The left dorsal foot wound remains open, albeit quite tiny. The anterior tibial wound is a little bit larger again today. The medial right leg wound continues to improve with an increase in robust and healthy granulation tissue and less accumulation of nonviable tissue. 09/29/2022: His home health providers wrapped his left leg extremely tightly and he ended up having to cut the wrap off of his foot. This resulted in his foot being very swollen and that the dorsal foot wound is a little bit larger today. The anterior tibial wound is about the same size with a little slough on the surface. The medial right leg wound continues to fill in. There are very few areas with any significant depth. There is still slough accumulation. 3/13; Patient has a left dorsal foot wound along with a right anterior tibial wound and right medial wound. We are using silver alginate to the left dorsal foot wound, Santyl and sodium thiosulfate compound ointment to the right lower extremity wounds under 4 layer compression. Patient has no issues or complaints today. 10/13/2022: The left dorsal foot wound is a little bit bigger today, but his foot is also a little bit more swollen. The right medial leg wound has filled in more and has substantially less depth. There is still  some nonviable tissue present. The small anterior lower leg wound looks about the same. 10/20/2022: The left dorsal foot wound is epithelializing. There does not appear to be much open at this site. There is some eschar around the edges. The right medial  leg wound continues to fill in with granulation tissue. The small anterior lower leg wound is a little bit bigger and there is a chunk of calcium protruding. He continues to have fairly copious drainage from the right leg. 10/27/2022: His home health agency failed to show up at all this past week and as a result, the excessive drainage from his right leg wound has resulted in tissue breakdown. His leg is red and irritated. The wounds themselves look about the same. The wound on his left dorsal foot is nearly closed underneath some eschar. 11/03/2022: We continue to have difficulty with his home health agency. He continues to have significant drainage from his right leg wound which has resulted in even further tissue breakdown. He also was not taking his Lasix as prescribed and I think much of the drainage has been his lymphedema fluid choosing the path of least resistance. The wound on his left dorsal foot is closed, but in the process of cleaning the site, dry skin was pulled off and he has a new small superficial wound just adjacent to where the dorsal foot wound was. 11/12/2022: His left dorsal foot is completely healed. The right medial leg continues to deteriorate. He is not taking his Lasix as prescribed, nor is he using his lymphedema pumps at all. As a result, he has massive amounts of drainage saturating his dressings and causing further tissue breakdown on his medial leg. Today, his dressings had a foul odor, as well. 11/19/2022: His right medial leg looks even worse today. There is more erythema and moisture related tissue breakdown. The 2 anterior wounds look a little bit better. The culture that I took last week did produce Pseudomonas and he is currently taking levofloxacin for this. 11/26/2022: There has been significant improvement to his right medial leg. It is far less raw and many of the superficial open areas have epithelialized. He continues to cut the foot portion of his wrap and as  result his foot is bulbous and swollen compared to the rest of his leg, where edema control is good. The smaller of the 2 anterior tibial wound is closed, while the larger is contracting somewhat with a bit of slough at the base. 12/03/2022: His leg continues to demonstrate improvement. There has been epithelialization of much of the denuded area. He has a new small anterior tibial wound that has opened around a nidus of calcium. Edema control is improved. 12/10/2022: Continued improvement in the periwound skin. The anterior tibial wounds are about the same, but the medial leg wound has improved further. There is less nonviable tissue present. 12/17/2022: Between his visit on Tuesday with the nurse and today, he has had significantly more drainage which has resulted in some periwound tissue maceration. I suspect this correlates somewhat with his diuretic use, as he says he takes his diuretic religiously on Saturday, Sunday, and Monday and then does not take it after Tuesday due to being out and about and not being close to easy restroom access. I suspect the excess fluid is just simply draining out of his leg. 12/21/2022: This was supposed to be a nurse visit, but his leg has broken down so significantly, that we have converted to a wound care visit. He did  take his diuretics over the weekend, but despite this, he has had copious drainage and all of his tissues have broken down substantially from the right medial leg ulcer all the way down to his ankle and even wrapping posterior to this. There is blue-green staining on his dressing, suggesting ongoing presence of Pseudomonas aeruginosa. 12/24/2022: The wound already looks better than it did 2 days ago. He has been taking ciprofloxacin and the Urgo clean dressing we applied did a much better job of wicking the drainage away from his leg. There is slough on the entire surface of the medial leg wound, but the erythema and induration has improved in the past  couple of days. 12/31/2022: Continued improvement of the wound. He says that the pain has basically abated since being on ciprofloxacin. Still with fairly heavy drainage and slough accumulation. 01/07/2023: Overall continued improvement. Still with heavy slough buildup, but less drainage. 01/14/2023: There is a little bit of improvement. The skin is still looking a bit excoriated. He has a few more days left of ciprofloxacin. 01/21/2023: There has been significant improvement. The periwound skin is in much better condition. The ulcers are shallower and actually have some good granulation tissue present under some slough. 01/28/2023: The wound continues to improve. All of the open areas measured smaller today. There is slough accumulation on the surfaces. His drainage has decreased markedly. 02/04/2023: Continued improvement in the wound. Drainage remains significantly diminished. Slough accumulation is present but to a lesser extent. [name], [name] ([id number]) [id number] Page 13 of 18 skin continues to improve. Edema control is good. 02/18/2023: The heavy drainage persists, although it is was not as significant today as it was at his nurse visit on Tuesday. The drainage is yellow and does not have the blue-green discoloration suggestive of Pseudomonas. The actual wounds on his leg are getting smaller, but the periwound skin is quite excoriated. 02/25/2023: His periwound skin is quite excoriated and he has a lot of clear yellow drainage. The main wounds are filling in further and epithelializing. 03/04/2023: The excoriation of the periwound skin has improved. Most of that area has dried up. The main wounds have less slough in them today. 03/11/2023: The main wounds have accumulated a little bit of slough. The periwound excoriation continues to improve. Unfortunately, he suffered a fall over the previous weekend and has abrasions on his knees bilaterally and on his right wrist  and upper arm. In addition, the skin on his left lower leg apparently blistered and opened, leaving several superficial wounds on this extremity. He has been approved for the [facility] wound V[facility] and has the equipment with him today. 03/18/2023: He has extensive excoriation to the periwound skin on the right medial lower leg. The actual wounds themselves are smaller with slough accumulation. The top of his left foot has begun weeping again. Using the [facility] wound VAC, he says that he through 4 canisters. He seems to be very fluid overloaded, without any significant dyspnea. 03/25/2023: The wound to his left anterior tibial surface has healed. The left dorsal foot wound has gotten smaller and has a small area of hypertrophic granulation tissue present. The periwound skin on the right medial lower leg looks slightly better, but still appears excoriated. The actual wounds continue to contract but are still accumulating quite a bit of slough. The tiny right anterior leg wounds are contracting. 04/01/2023: The left dorsal foot wound got a little bit bigger this week. There is slough accumulation. The periwound  skin on the right medial lower leg continues to improve. The main medial right lower leg wounds have a dark gray discoloration and there is a strong odor coming from them. The back of his right lower leg just above the ankle also has a little bit of new breakdown where he is draining edema fluid. The small anterior leg wounds are stable. Patient History Information obtained from Patient, Chart. Family History Cancer - Mother, Heart Disease - Father,Mother, Hypertension - Father,Siblings, Lung Disease - Siblings, No family history of Diabetes, Hereditary Spherocytosis, Kidney Disease, Seizures, Stroke, Thyroid Problems, Tuberculosis. Social History Former smoker - quit 1991, Marital Status - Married, Alcohol Use - Moderate, Drug Use - No History, Caffeine Use - Daily - coffee. Medical  History Eyes Patient has history of Cataracts - right eye removed Denies history of Glaucoma, Optic Neuritis Ear/Nose/Mouth/Throat Denies history of Chronic sinus problems/congestion, Middle ear problems Hematologic/Lymphatic Patient has history of Anemia - macrocytic, Lymphedema Cardiovascular Patient has history of Congestive Heart Failure, Deep Vein Thrombosis - left leg, Hypertension, Peripheral Venous Disease Endocrine Denies history of Type I Diabetes, Type II Diabetes Genitourinary Denies history of End Stage Renal Disease Integumentary (Skin) Denies history of History of Burn Oncologic Denies history of Received Chemotherapy, Received Radiation Psychiatric Denies history of Anorexia/bulimia, Confinement Anxiety Hospitalization/Surgery History - cervical fusion. - lumbar surgery. - coronary stent placement. - lasik eye surgery. - hydrocele excision. Medical A Surgical History Notes nd Constitutional Symptoms (General Health) obesity Ear/Nose/Mouth/Throat hard of hearing Respiratory frozen diaphragm right Cardiovascular hyperlipidemia Musculoskeletal myositis, lumbar DDD, spinal stenosis, cervical facet joint syndrome Objective Constitutional Hypertensive, asymptomatic. no acute distress. Vitals Time Taken: 10:29 AM, Height: 71 in, Weight: 267 lbs, BMI: 37.2, Temperature: 97.8 F, Pulse: 62 bpm, Respiratory Rate: 18 breaths/min, Blood Pressure: 154/80 mmHg. [name], [name] ([id number]) [id number] Page 14 of 18 Respiratory Normal work of breathing on room air. General Notes: 04/01/2023: The left dorsal foot wound got a little bit bigger this week. There is slough accumulation. The periwound skin on the right medial lower leg continues to improve. The main medial right lower leg wounds have a dark gray discoloration and there is a strong odor coming from them. The back of his right lower leg just above the ankle also has a little bit of new  breakdown where he is draining edema fluid. The small anterior leg wounds are stable. Integumentary (Hair, Skin) Wound #3 status is Open. Original cause of wound was Gradually Appeared. The date acquired was: 12/07/2021. The wound has been in treatment 59 weeks. The wound is located on the Right,Medial Lower Leg. The wound measures 8.9cm length x 5.8cm width x 0.3cm depth; 40.542cm^2 area and 12.163cm^3 volume. There is Fat Layer (Subcutaneous Tissue) exposed. There is no tunneling or undermining noted. There is a large amount of serous drainage noted. Foul odor after cleansing was noted. The wound margin is indistinct and nonvisible. There is medium (34-66%) red granulation within the wound bed. There is a medium (34-66%) amount of necrotic tissue within the wound bed including Adherent Slough. The periwound skin appearance had no abnormalities noted for moisture. The periwound skin appearance exhibited: Excoriation, Erythema. The surrounding wound skin color is noted with erythema which is circumferential. Periwound temperature was noted as No Abnormality. Wound #6 status is Open. Original cause of wound was Gradually Appeared. The date acquired was: 05/12/2022. The wound has been in treatment 46 weeks. The wound is located on the Right,Anterior Lower Leg. The wound measures 1.4cm length x 0.3cm  width x 0.2cm depth; 0.33cm^2 area and 0.066cm^3 volume. There is Fat Layer (Subcutaneous Tissue) exposed. There is no tunneling or undermining noted. There is a small amount of serous drainage noted. The wound margin is epibole. There is small (1-33%) red granulation within the wound bed. There is a large (67-100%) amount of necrotic tissue within the wound bed including Adherent Slough. The periwound skin appearance had no abnormalities noted for texture. The periwound skin appearance had no abnormalities noted for moisture. The periwound skin appearance exhibited: Hemosiderin Staining. Periwound temperature  was noted as No Abnormality. Wound #8 status is Open. Original cause of wound was Gradually Appeared. The date acquired was: 03/18/2023. The wound has been in treatment 2 weeks. The wound is located on the Left,Dorsal Foot. The wound measures 1.1cm length x 1.3cm width x 0.1cm depth; 1.123cm^2 area and 0.112cm^3 volume. There is Fat Layer (Subcutaneous Tissue) exposed. There is no tunneling or undermining noted. There is a medium amount of serosanguineous drainage noted. The wound margin is flat and intact. There is medium (34-66%) red granulation within the wound bed. There is a medium (34-66%) amount of necrotic tissue within the wound bed including Adherent Slough. The periwound skin appearance had no abnormalities noted for texture. The periwound skin appearance had no abnormalities noted for moisture. The periwound skin appearance had no abnormalities noted for color. Periwound temperature was noted as No Abnormality. Wound #9 status is Open. Original cause of wound was Gradually Appeared. The date acquired was: 03/22/2023. The wound is located on the Right,Lateral Lower Leg. The wound measures 2cm length x 1.8cm width x 0.1cm depth; 2.827cm^2 area and 0.283cm^3 volume. There is Fat Layer (Subcutaneous Tissue) exposed. There is no tunneling or undermining noted. There is a medium amount of purulent drainage noted. The wound margin is flat and intact. There is medium (34-66%) red granulation within the wound bed. There is a medium (34-66%) amount of necrotic tissue within the wound bed. The periwound skin appearance had no abnormalities noted for texture. The periwound skin appearance had no abnormalities noted for moisture. The periwound skin appearance exhibited: Erythema. The surrounding wound skin color is noted with erythema which is circumferential. Periwound temperature was noted as No Abnormality. 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 There was a Excisional Skin/Subcutaneous Tissue Debridement with a total area of 30.39 sq cm performed by [name] Guess, MD. With the following instrument(s): Curette to remove Viable and Non-Viable tissue/material. Material removed includes Subcutaneous Tissue, Slough, and Other: calcium deposit after achieving pain control using Lidocaine 4% T opical Solution. No specimens were taken. A time out was conducted at 11:20, prior to the start of the procedure. A Minimum amount of bleeding was controlled with Pressure. The procedure was tolerated well with a pain level of 3 throughout and a pain level of 2 following the procedure. Post Debridement Measurements: 8.9cm length x 5.8cm width x 0.4cm depth; 16.217cm^3 volume. Character of Wound/Ulcer Post Debridement is improved. Post procedure Diagnosis Wound #3: Same as Pre-Procedure General Notes: scribed for Dr. [name] by [name] Deed, RN. Pre-procedure  diagnosis of Wound #3 is a Lymphedema located on the Right,Medial Lower Leg . There was a Three Layer Compression Therapy Procedure by [name] Deed, RN. 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 There was a Selective/Open Wound Non-Viable Tissue Debridement with a total area of 0.16 sq cm performed by [name] Guess, MD. With the following instrument(s): Curette to remove  Non-Viable tissue/material. Material removed includes [facility] and Other: calcium deposit after achieving pain control using Lidocaine 4% Topical Solution. No specimens were taken. A time out was conducted at 11:20, prior to the start of the procedure. A Minimum amount of bleeding was controlled with Pressure. The procedure was tolerated well with a pain level of 0 throughout and a pain level of 0 following the procedure. Post Debridement Measurements: 1.4cm length x 0.3cm width x 0.2cm depth; 0.066cm^3 volume. Character of Wound/Ulcer Post Debridement is improved. Post procedure Diagnosis Wound #6: Same as Pre-Procedure General Notes: scribed for Dr. [name] by [name] Deed, RN. Wound #8 [name], [name] ([id number]) [id number] Page 15 of 18 Pre-procedure diagnosis of Wound #8 is a Lymphedema located on the Left,Dorsal Foot . There was a Selective/Open Wound Non-Viable Tissue Debridement with a total area of 1.12 sq cm performed by [name] Guess, MD. With the following instrument(s): Curette to remove Non-Viable tissue/material. Material removed includes [facility] and Other: calcium deposit after achieving pain control using Lidocaine 4% T opical Solution. No specimens were taken. A time out was conducted at 11:20, prior to the start of the procedure. A Minimum amount of bleeding was controlled with Pressure. The procedure was tolerated well with a pain level of 3 throughout and a pain level of 2 following the procedure. Post Debridement Measurements: 1.1cm length x 1.3cm width x 0.1cm depth; 0.112cm^3 volume. Character of Wound/Ulcer Post Debridement is improved. Post procedure Diagnosis Wound #8: Same as Pre-Procedure General Notes: scribed for Dr. [name] by [name] Deed, RN. Plan Follow-up Appointments: Return Appointment in 1 week. - Dr. [name] RM 1 Friday 9/13 @ 11:45 am Nurse Visit: - Tues. 9/10 @ 3:00 pm Anesthetic: (In clinic) Topical Lidocaine 4% applied to  wound bed Bathing/ Shower/ Hygiene: May shower with protection but do not get wound dressing(s) wet. Protect dressing(s) with water repellant cover (for example, large plastic bag) or a cast cover and may then take shower. Negative Presssure Wound Therapy: Wound Vac to wound continuously at 182mm/hg pressure Black Foam - peel and place Edema Control - Lymphedema / SCD / Other: Lymphedema Pumps. Use Lymphedema pumps on leg(s) 2-3 times a day for 45-60 minutes. If wearing any wraps or hose, do not remove them. Continue exercising as instructed. Elevate legs to the level of the heart or above for 30 minutes daily and/or when sitting for 3-4 times a day throughout the day. - throughout the day Avoid standing for long periods of time. Exercise regularly The following medication(s) was prescribed: levofloxacin oral 750 mg tablet 1 tab p.o. daily x 14 days starting 04/01/2023 WOUND #3: - Lower Leg Wound Laterality: Right, Medial Peri-Wound Care: Skin Prep 1 x Per Week/30 Days Discharge Instructions: cavelon to macerated wound margins Peri-Wound Care: Sween Lotion (Moisturizing lotion) 1 x Per Week/30 Days Discharge Instructions: Apply moisturizing lotion as directed Topical: Gentamicin 1 x Per Week/30 Days Discharge Instructions: in lager holes only Prim Dressing: Maxorb Extra Ag+ Alginate Dressing, 4x4.75 (in/in) 1 x Per Week/30 Days ary Discharge Instructions: Apply toexcoriated  weeping areas Prim Dressing: peel and place vac 1 x Per Week/30 Days ary Com pression Wrap: ThreePress (3 layer compression wrap) 1 x Per Week/30 Days Discharge Instructions: Apply three layer compression as directed. WOUND #6: - Lower Leg Wound Laterality: Right, Anterior Peri-Wound Care: Sween Lotion (Moisturizing lotion) 1 x Per Week/30 Days Discharge Instructions: Apply moisturizing lotion as directed Prim Dressing: peel and place vac 1 x Per Week/30 Days ary Com pression Wrap: ThreePress (3 layer compression  wrap) 1 x Per Week/30 Days Discharge Instructions: Apply three layer compression as directed. WOUND #8: - Foot Wound Laterality: Dorsal, Left Cleanser: Soap and Water 1 x Per Week/30 Days Discharge Instructions: May shower and wash wound with dial antibacterial soap and water prior to dressing change. Peri-Wound Care: Sween Lotion (Moisturizing lotion) 1 x Per Week/30 Days Discharge Instructions: Apply moisturizing lotion as directed Prim Dressing: Maxorb Extra Ag+ Alginate Dressing, 2x2 (in/in) 1 x Per Week/30 Days ary Discharge Instructions: Apply to wound bed as instructed Secondary Dressing: Woven Gauze Sponge, Non-Sterile 4x4 in 1 x Per Week/30 Days Discharge Instructions: Apply over primary dressing as directed. Com pression Wrap: ThreePress (3 layer compression wrap) 1 x Per Week/30 Days Discharge Instructions: Apply three layer compression as directed. WOUND #9: - Lower Leg Wound Laterality: Right, Lateral Cleanser: Soap and Water 1 x Per Week/30 Days Discharge Instructions: May shower and wash wound with dial antibacterial soap and water prior to dressing change. Peri-Wound Care: Sween Lotion (Moisturizing lotion) 1 x Per Week/30 Days Discharge Instructions: Apply moisturizing lotion as directed Prim Dressing: Maxorb Extra Ag+ Alginate Dressing, 2x2 (in/in) 1 x Per Week/30 Days ary Discharge Instructions: Apply to wound bed as instructed Secondary Dressing: Woven Gauze Sponge, Non-Sterile 4x4 in 1 x Per Week/30 Days Discharge Instructions: Apply over primary dressing as directed. Com pression Wrap: ThreePress (3 layer compression wrap) 1 x Per Week/30 Days Discharge Instructions: Apply three layer compression as directed. 04/01/2023: The left dorsal foot wound got a little bit bigger this week. There is slough accumulation. The periwound skin on the right medial lower leg continues to improve. The main medial right lower leg wounds have a dark gray discoloration and there is a  strong odor coming from them. The back of his right lower leg just above the ankle also has a little bit of new breakdown where he is draining edema fluid. The small anterior leg wounds are stable. I used a curette to debride slough from the left dorsal foot wound. The excoriations on his medial right lower leg, as well as the small punctate wounds on the anterior tibial surface. The new area of skin breakdown on the back of his leg did not require debridement. I debrided slough and subcutaneous tissue from the large main right medial leg wounds. Given that every time he gets this gray discoloration, he grows out Pseudomonas, I went ahead and prescribed him another course of oral levofloxacin and we will put topical gentamicin just in these 2 areas, rather than on the entire leg. We will continue to use negative pressure wound therapy to the large medial leg wounds along with silver alginate and bilateral compression wraps everywhere else. He will have a nurse visit on Tuesday and see me a week from today. JAELYNN, HAILEY ([id number]) [id number] Page 16 of 18 Electronic Signature(s) Signed: 04/01/2023 12:05:26 PM By: [name] Guess MD FACS Entered By: [name] on 04/01/2023 09:05:26 -------------------------------------------------------------------------------- HxROS Details Patient Name: Date of Service: Annice Needy, [name] 04/01/2023 10:15 A M Medical  Record Number: [id number] Patient Account Number: [id number] Date of Birth/Sex: Treating RN: 11/05/1950 (72 y.o. [name] Primary Care Provider: Nicoletta [name] Other Clinician: Referring Provider: Treating Provider/Extender: [name] in Treatment: 31 Information Obtained From Patient Chart Constitutional Symptoms (General Health) Medical History: Past Medical History Notes: obesity Eyes Medical History: Positive for: Cataracts - right eye removed Negative for: Glaucoma;  Optic Neuritis Ear/Nose/Mouth/Throat Medical History: Negative for: Chronic sinus problems/congestion; Middle ear problems Past Medical History Notes: hard of hearing Hematologic/Lymphatic Medical History: Positive for: Anemia - macrocytic; Lymphedema Respiratory Medical History: Past Medical History Notes: frozen diaphragm right Cardiovascular Medical History: Positive for: Congestive Heart Failure; Deep Vein Thrombosis - left leg; Hypertension; Peripheral Venous Disease Past Medical History Notes: hyperlipidemia Endocrine Medical History: Negative for: Type I Diabetes; Type II Diabetes Genitourinary Medical History: Negative for: End Stage Renal Disease Integumentary (Skin) Medical History: Negative for: History of Burn PARAG, BASOM ([id number]) [id number] Page 17 of 18 Musculoskeletal Medical History: Past Medical History Notes: myositis, lumbar DDD, spinal stenosis, cervical facet joint syndrome Oncologic Medical History: Negative for: Received Chemotherapy; Received Radiation Psychiatric Medical History: Negative for: Anorexia/bulimia; Confinement Anxiety HBO Extended History Items Eyes: Cataracts Immunizations Pneumococcal Vaccine: Received Pneumococcal Vaccination: Yes Received Pneumococcal Vaccination On or After 60th Birthday: Yes Implantable Devices No devices added Hospitalization / Surgery History Type of Hospitalization/Surgery cervical fusion lumbar surgery coronary stent placement lasik eye surgery hydrocele excision Family and Social History Cancer: Yes - Mother; Diabetes: No; Heart Disease: Yes - Father,Mother; Hereditary Spherocytosis: No; Hypertension: Yes - Father,Siblings; Kidney Disease: No; Lung Disease: Yes - Siblings; Seizures: No; Stroke: No; Thyroid Problems: No; Tuberculosis: No; Former smoker - quit 1991; Marital Status - Married; Alcohol Use: Moderate; Drug Use: No History; Caffeine Use: Daily - coffee;  Financial Concerns: No; Food, Clothing or Shelter Needs: No; Support System Lacking: No; Transportation Concerns: No Electronic Signature(s) Signed: 04/01/2023 12:16:08 PM By: [name] Guess MD FACS Signed: 04/01/2023 1:19:41 PM By: [name] Deed RN, BSN Entered By: [name] on 04/01/2023 09:00:52 -------------------------------------------------------------------------------- SuperBill Details Patient Name: Date of Service: YOSHIAKI, HEWLETT 04/01/2023 Medical Record Number: [id number] Patient Account Number: [id number] Date of Birth/Sex: Treating RN: [birth year] (72 y.o. 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 Venous insufficiency (chronic) (peripheral) Chalfin, Chia ([id number]) [id number] Page 18 of 18 I89.0 Lymphedema, not elsewhere classified I10 Essential (primary) hypertension I25.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 angina pectoris Facility Procedures : CPT4 Code: 20254270 Description: 11042 - DEB SUBQ TISSUE 20 SQ CM/< ICD-10 Diagnosis Description 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 Modifier: ed Quantity: 1 : CPT4 Code: 62376283 Description: 11045 - DEB SUBQ TISS EA ADDL 20CM ICD-10 Diagnosis Description 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 Modifier: ed Quantity: 1 : CPT4 Code: 15176160 Description: 97597 - DEBRIDE WOUND 1ST 20 SQ CM OR < ICD-10  Diagnosis Description L97.521 Non-pressure chronic ulcer of other part of left foot limited to breakdown of sk Modifier: in Quantity: 1 : CPT4 Code: 73710626 Description: 97606 - WOUND VAC-GREATER TH  50 SQ CM Modifier: 59 Quantity: 1 Physician Procedures : CPT4 Code Description Modifier 1610960 99214 - WC PHYS LEVEL 4 - EST PT 25 ICD-10 Diagnosis Description 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ed L97.521 Non-pressure chronic ulcer of other part of left  foot limited to breakdown of skin 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.0 Lymphedema, not elsewhere classified Quantity: 1 : [id number] 11042 - WC PHYS SUBQ TISS 20 SQ CM ICD-10 Diagnosis Description 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ed Quantity: 1 : 1914782 11045 - WC PHYS SUBQ TISS EA ADDL 20 CM ICD-10 Diagnosis Description 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ed Quantity: 1 : 9562130 97597 - WC PHYS DEBR WO ANESTH 20 SQ CM ICD-10 Diagnosis Description L97.521 Non-pressure chronic ulcer of other part of left foot limited to breakdown of skin Quantity: 1 Electronic Signature(s) Signed: 04/01/2023 12:16:08 PM By: [name] Guess MD FACS Signed: 04/01/2023 1:19:41 PM By: [name] Deed RN, BSN Previous Signature: 04/01/2023 12:06:16 PM Version By: [name] Guess MD FACS Entered By: [name] on 04/01/2023 09:09:45

## 2023-04-05 ENCOUNTER — Encounter (HOSPITAL_BASED_OUTPATIENT_CLINIC_OR_DEPARTMENT_OTHER): Payer: Medicare Other | Admitting: General Surgery

## 2023-04-05 DIAGNOSIS — L97812 Non-pressure chronic ulcer of other part of right lower leg with fat layer exposed: Secondary | ICD-10-CM | POA: Diagnosis not present

## 2023-04-05 DIAGNOSIS — L97521 Non-pressure chronic ulcer of other part of left foot limited to breakdown of skin: Secondary | ICD-10-CM | POA: Diagnosis not present

## 2023-04-05 DIAGNOSIS — I890 Lymphedema, not elsewhere classified: Secondary | ICD-10-CM | POA: Diagnosis not present

## 2023-04-05 DIAGNOSIS — M609 Myositis, unspecified: Secondary | ICD-10-CM | POA: Diagnosis not present

## 2023-04-05 DIAGNOSIS — L942 Calcinosis cutis: Secondary | ICD-10-CM | POA: Diagnosis not present

## 2023-04-05 DIAGNOSIS — I872 Venous insufficiency (chronic) (peripheral): Secondary | ICD-10-CM | POA: Diagnosis not present

## 2023-04-05 NOTE — Progress Notes (Signed)
DULCE, WOJTAS ([id number]) [id number] Page 1 of 1 Visit Report for 04/05/2023 SuperBill Details Patient Name: Date of Service: [name], [name] 04/05/2023 Medical Record Number: [id number] Patient Account Number: [id number] Date of Birth/Sex: Treating RN: [birth year] (72 y.o. 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 Venous insufficiency (chronic) (peripheral) I89.0 Lymphedema, not elsewhere classified I10 Essential (primary) hypertension I25.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 angina pectoris Facility Procedures CPT4 Code Description Modifier Quantity [id number] 97606 - WOUND VAC-GREATER TH 50 SQ CM 1 ICD-10 Diagnosis Description 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ed [id number] (Facility Use Only) 30160FU - APPLY MULTLAY COMPRS LWR RT LEG 1 ICD-10 Diagnosis Description 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ed Physician Procedures Quantity CPT4 Code Description Modifier [id number] 97606 - WC PHYS TX WOUND VAC>50 SQ CM 1 ICD-10 Diagnosis Description 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ed Electronic Signature(s) Signed: 04/05/2023 3:31:30 PM By: [name] Signed: 04/05/2023 3:58:21 PM By: Duanne Guess MD FACS Entered By: [name] on 04/05/2023 15:31:12

## 2023-04-05 NOTE — Progress Notes (Signed)
RAMIZ, SPANNAGEL ([id number]) [id number] Page 1 of 8 Visit Report for 04/05/2023 Arrival Information Details Patient Name: Date of Service: [name], [name] 04/05/2023 3:00 PM Medical Record Number: [id number] Patient Account Number: [id number] Date of Birth/Sex: Treating RN: 07-27-1950 (72 y.o. [name] Primary Care Laray Rivkin: [name] Other Clinician: Referring [name]: Treating [name]/Extender: [name] in Treatment: 60 Visit Information History Since Last Visit Added or deleted any medications: No Patient Arrived: Wheel Chair Any new allergies or adverse reactions: No Arrival Time: 15:17 Had a fall or experienced change in No Accompanied By: self activities of daily living that may affect Transfer Assistance: Manual risk of falls: Patient Identification Verified: Yes Signs or symptoms of abuse/neglect since last visito No Secondary Verification Process Completed: Yes Hospitalized since last visit: No Patient Requires Transmission-Based Precautions: No Implantable device outside of the clinic excluding No Patient Has Alerts: Yes cellular tissue based products placed in the center Patient Alerts: R ABI= N/C, TBI= .75 since last visit: L ABI =N/C, TBI = .57 Has Dressing in Place as Prescribed: Yes Has Compression in Place as Prescribed: Yes Pain Present Now: No Electronic Signature(s) Signed: 04/05/2023 3:31:30 PM By: [name] Entered By: [name] on 04/05/2023 12:18:14 -------------------------------------------------------------------------------- Compression Therapy Details Patient Name: Date of Service: [name], WALT [name] 04/05/2023 3:00 PM Medical Record Number: [id number] Patient Account Number: [id number] Date of Birth/Sex: Treating RN: Oct 27, 1950 (72 y.o. [name] Primary Care [name]: [name] Other Clinician: Referring [name]: Treating [name]/Extender:  [name] in Treatment: 60 Compression Therapy Performed for Wound Assessment: Wound #3 Right,Medial Lower Leg Performed By: Clinician [name] Bruin, RN Compression Type: Three Layer Electronic Signature(s) Signed: 04/05/2023 3:31:30 PM By: [name] Entered By: [name] on 04/05/2023 12:19:51 -------------------------------------------------------------------------------- Encounter Discharge Information Details Patient Name: Date of Service: [name] 04/05/2023 3:00 PM Medical Record Number: [id number] Patient Account Number: [id number] [name], [name] ([id number]) [id number] Page 2 of 8 Date of Birth/Sex: Treating RN: [birth year] (72 y.o. [name] Primary Care Shady Bradish: Other Clinician: Nicoletta [name] Referring Nathalia Wismer: Treating [name]/Extender: [name] in Treatment: 78 Encounter Discharge Information Items Discharge Condition: Stable Ambulatory Status: Wheelchair Discharge Destination: Home Transportation: Private Auto Accompanied By: self Schedule Follow-up Appointment: Yes Clinical Summary of Care: Patient Declined Electronic Signature(s) Signed: 04/05/2023 3:31:30 PM By: [name] Entered By: [name] on 04/05/2023 12:30:07 -------------------------------------------------------------------------------- Negative Pressure Wound Therapy Maintenance (NPWT) Details Patient Name: Date of Service: [name], [name] 04/05/2023 3:00 PM Medical Record Number: [id number] Patient Account Number: [id number] Date of Birth/Sex: Treating RN: [birth year] (72 y.o. [name] Primary Care [name]: [name] Other Clinician: Referring [name]: Treating [name]/Extender: [name] in Treatment: 60 NPWT Maintenance Performed for: Wound #3 Right, Medial Lower Leg Additional Injuries Covered:  Yes Additional Injuries: Wound #6 Right, Anterior Lower Leg Performed By: [name] Bruin, RN Type: VAC System Coverage Size (sq cm): 52.04 Pressure Type: Constant Pressure Setting: 125 mmHG Drain Type: None Primary Contact: Other : Sponge/Dressing Type: Foam- Black Date Initiated: 03/11/2023 Dressing Removed: Yes Quantity of Sponges/Gauze Removed: 1 Canister Changed: No Dressing Reapplied: Yes Quantity of Sponges/Gauze Inserted: 1 Days On NPWT : 26 Electronic Signature(s) Signed: 04/05/2023 3:31:30 PM By: [name] Entered By: [name] on 04/05/2023 12:20:22 -------------------------------------------------------------------------------- Patient/Caregiver Education Details Patient Name: Date of Service: [name] 9/10/2024andnbsp3:00 PM Medical Record Number: [id number] Patient Account Number: [id number] Date of Birth/Gender: Treating RN: [birth year] (72 y.o. [name] Primary Care Physician:  [name] Other Clinician: Referring Physician: Treating Physician/Extender: [name] in Treatment: [address], [place] ([id number]) [id number] Page 3 of 8 Education Assessment Education Provided To: Patient Education Topics Provided Wound/Skin Impairment: Methods: Explain/Verbal Responses: Reinforcements needed, State content correctly Electronic Signature(s) Signed: 04/05/2023 3:31:30 PM By: [name] Entered By: [name] on 04/05/2023 12:29:53 -------------------------------------------------------------------------------- Wound Assessment Details Patient Name: Date of Service: [name], [name] 04/05/2023 3:00 PM Medical Record Number: [id number] Patient Account Number: [id number] Date of Birth/Sex: Treating RN: [birth year] (72 y.o. [name] Primary Care [name]: [name] Other Clinician: Referring [name]: Treating [name]/Extender: [name] in Treatment: 60 Wound Status Wound Number: 3 Primary Lymphedema Etiology: Wound Location: Right, Medial Lower Leg Wound Open Wounding Event: Gradually Appeared Status: Date Acquired: 12/07/2021 Comorbid Cataracts, Anemia, Lymphedema, Congestive Heart Failure, Deep Weeks Of Treatment: 60 History: Vein Thrombosis, Hypertension, Peripheral Venous Disease Clustered Wound: No Wound Measurements Length: (cm) 8.9 Width: (cm) 5.8 Depth: (cm) 0.3 Area: (cm) 40.542 Volume: (cm) 12.163 % Reduction in Area: -659.1% % Reduction in Volume: -469.4% Epithelialization: Small (1-33%) Wound Description Classification: Full Thickness With Exposed Support Structures Wound Margin: Indistinct, nonvisible Exudate Amount: Large Exudate Type: Serous Exudate Color: amber Foul Odor After Cleansing: Yes Due to Product Use: No Slough/Fibrino Yes Wound Bed Granulation Amount: Medium (34-66%) Exposed Structure Granulation Quality: Red Fascia Exposed: No Necrotic Amount: Medium (34-66%) Fat Layer (Subcutaneous Tissue) Exposed: Yes Necrotic Quality: Adherent Slough Tendon Exposed: No Muscle Exposed: No Joint Exposed: No Bone Exposed: No Periwound Skin Texture Texture Color No Abnormalities Noted: No No Abnormalities Noted: No Excoriation: Yes Erythema: Yes Erythema Location: Circumferential Moisture No Abnormalities Noted: Yes Temperature / Pain Temperature: No Abnormality Fabro, Johnthan ([id number]) [id number] Page 4 of 8 Treatment Notes Wound #3 (Lower Leg) Wound Laterality: Right, Medial Cleanser Peri-Wound Care Skin Prep Discharge Instruction: cavelon to macerated wound margins Sween Lotion (Moisturizing lotion) Discharge Instruction: Apply moisturizing lotion as directed Topical Gentamicin Discharge Instruction: in lager holes only Primary Dressing Maxorb Extra Ag+ Alginate Dressing, 4x4.75 (in/in) Discharge Instruction:  Apply toexcoriated weeping areas peel and place vac Secondary Dressing Secured With Compression Wrap ThreePress (3 layer compression wrap) Discharge Instruction: Apply three layer compression as directed. Compression Stockings Add-Ons Electronic Signature(s) Signed: 04/05/2023 3:31:30 PM By: [name] Entered By: [name] on 04/05/2023 12:18:56 -------------------------------------------------------------------------------- Wound Assessment Details Patient Name: Date of Service: [name], [name] 04/05/2023 3:00 PM Medical Record Number: [id number] Patient Account Number: [id number] Date of Birth/Sex: Treating RN: 13-Mar-1951 (72 y.o. [name] Primary Care Jacey Pelc: [name] Other Clinician: Referring Wilhemenia Camba: Treating [facility]/Extender: [name] in Treatment: 60 Wound Status Wound Number: 6 Primary Calcinosis Etiology: Wound Location: Right, Anterior Lower Leg Wound Open Wounding Event: Gradually Appeared Status: Date Acquired: 05/12/2022 Comorbid Cataracts, Anemia, Lymphedema, Congestive Heart Failure, Deep Weeks Of Treatment: 46 History: Vein Thrombosis, Hypertension, Peripheral Venous Disease Clustered Wound: Yes Wound Measurements Length: (cm) Width: (cm) Depth: (cm) Clustered Quantity: Area: (cm) Volume: (cm) 1.4 % Reduction in Area: -39.8% 0.3 % Reduction in Volume: -175% 0.2 Epithelialization: Large (67-100%) 2 0.33 0.066 Wound Description Classification: Full Thickness Without Exposed Support Structures Wound Margin: Epibole Exudate Amount: Small Exudate Type: Serous Exudate Color: amber Kaupp, [name] ([id number]) Foul Odor After Cleansing: No Slough/Fibrino Yes [id number] Page 5 of 8 Wound Bed Granulation Amount: Small (1-33%) Exposed Structure Granulation Quality: Red Fascia Exposed: No Necrotic Amount: Large (67-100%) Fat Layer (Subcutaneous Tissue)  Exposed: Yes Necrotic Quality: Adherent Slough Tendon Exposed:  No Muscle Exposed: No Joint Exposed: No Bone Exposed: No Periwound Skin Texture Texture Color No Abnormalities Noted: Yes No Abnormalities Noted: No Hemosiderin Staining: Yes Moisture No Abnormalities Noted: Yes Temperature / Pain Temperature: No Abnormality Treatment Notes Wound #6 (Lower Leg) Wound Laterality: Right, Anterior Cleanser Peri-Wound Care Sween Lotion (Moisturizing lotion) Discharge Instruction: Apply moisturizing lotion as directed Topical Primary Dressing peel and place vac Secondary Dressing Secured With Compression Wrap ThreePress (3 layer compression wrap) Discharge Instruction: Apply three layer compression as directed. Compression Stockings Add-Ons Electronic Signature(s) Signed: 04/05/2023 3:31:30 PM By: [name] Entered By: [name] on 04/05/2023 12:19:07 -------------------------------------------------------------------------------- Wound Assessment Details Patient Name: Date of Service: [name], MANAS [name] 04/05/2023 3:00 PM Medical Record Number: [id number] Patient Account Number: [id number] Date of Birth/Sex: Treating RN: 1950-09-12 (72 y.o. [name] Primary Care Venecia Mehl: [name] Other Clinician: Referring Neftaly Swiss: Treating Caelen Reierson/Extender: [name] in Treatment: 60 Wound Status Wound Number: 8 Primary Lymphedema Etiology: Wound Location: Left, Dorsal Foot Wound Open Wounding Event: Gradually Appeared Status: Date Acquired: 03/18/2023 Comorbid Cataracts, Anemia, Lymphedema, Congestive Heart Failure, Deep Weeks Of Treatment: 2 History: Vein Thrombosis, Hypertension, Peripheral Venous Disease Clustered Wound: No Wound Measurements Length: (cm) 1.1 Width: (cm) 1.3 [name], [name] ([id number]) Depth: (cm) 0.1 Area: (cm) 1.123 Volume: (cm) 0.112 % Reduction in Area: -473% % Reduction in Volume:  -460% [id number] Page 6 of 8 Epithelialization: Medium (34-66%) Wound Description Classification: Full Thickness Without Exposed Suppor Wound Margin: Flat and Intact Exudate Amount: Medium Exudate Type: Serosanguineous Exudate Color: red, brown t Structures Foul Odor After Cleansing: No Slough/Fibrino Yes Wound Bed Granulation Amount: Medium (34-66%) Exposed Structure Granulation Quality: Red Fascia Exposed: No Necrotic Amount: Medium (34-66%) Fat Layer (Subcutaneous Tissue) Exposed: Yes Necrotic Quality: Adherent Slough Tendon Exposed: No Muscle Exposed: No Joint Exposed: No Bone Exposed: No Periwound Skin Texture Texture Color No Abnormalities Noted: Yes No Abnormalities Noted: Yes Moisture Temperature / Pain No Abnormalities Noted: Yes Temperature: No Abnormality Treatment Notes Wound #8 (Foot) Wound Laterality: Dorsal, Left Cleanser Soap and Water Discharge Instruction: May shower and wash wound with dial antibacterial soap and water prior to dressing change. Peri-Wound Care Sween Lotion (Moisturizing lotion) Discharge Instruction: Apply moisturizing lotion as directed Topical Primary Dressing Maxorb Extra Ag+ Alginate Dressing, 2x2 (in/in) Discharge Instruction: Apply to wound bed as instructed Secondary Dressing Woven Gauze Sponge, Non-Sterile 4x4 in Discharge Instruction: Apply over primary dressing as directed. Secured With Compression Wrap ThreePress (3 layer compression wrap) Discharge Instruction: Apply three layer compression as directed. Compression Stockings Add-Ons Electronic Signature(s) Signed: 04/05/2023 3:31:30 PM By: [name] Entered By: [name] on 04/05/2023 12:19:17 -------------------------------------------------------------------------------- Wound Assessment Details Patient Name: Date of Service: [name], [name] 04/05/2023 3:00 PM Medical Record Number: [id number] Patient Account Number:  [id number] Date of Birth/Sex: Treating RN: 1950-11-09 (72 y.o. [name] [place], [name] ([id number]) [id number] Page 7 of 8 Primary Care Amor Hyle: [name] Other Clinician: Referring Shonya Sumida: Treating Arrin Ishler/Extender: [name] in Treatment: 60 Wound Status Wound Number: 9 Primary Lymphedema Etiology: Wound Location: Right, Lateral Lower Leg Wound Open Wounding Event: Gradually Appeared Status: Date Acquired: 03/22/2023 Comorbid Cataracts, Anemia, Lymphedema, Congestive Heart Failure, Deep Weeks Of Treatment: 0 History: Vein Thrombosis, Hypertension, Peripheral Venous Disease Clustered Wound: No Wound Measurements Length: (cm) 2.8 Width: (cm) 1.8 Depth: (cm) 0.1 Area: (cm) 3.958 Volume: (cm) 0.396 % Reduction in Area: -40% % Reduction in Volume: -39.9% Epithelialization: Small (1-33%) Wound Description Classification: Full Thickness Without Exposed Support Structures Wound  Margin: Flat and Intact Exudate Amount: Medium Exudate Type: Purulent Exudate Color: yellow, brown, green Foul Odor After Cleansing: No Slough/Fibrino Yes Wound Bed Granulation Amount: Medium (34-66%) Exposed Structure Granulation Quality: Red Fascia Exposed: No Necrotic Amount: Medium (34-66%) Fat Layer (Subcutaneous Tissue) Exposed: Yes Tendon Exposed: No Muscle Exposed: No Joint Exposed: No Bone Exposed: No Periwound Skin Texture Texture Color No Abnormalities Noted: Yes No Abnormalities Noted: No Erythema: Yes Moisture Erythema Location: Circumferential No Abnormalities Noted: Yes Temperature / Pain Temperature: No Abnormality Treatment Notes Wound #9 (Lower Leg) Wound Laterality: Right, Lateral Cleanser Soap and Water Discharge Instruction: May shower and wash wound with dial antibacterial soap and water prior to dressing change. Peri-Wound Care Sween Lotion (Moisturizing lotion) Discharge Instruction: Apply  moisturizing lotion as directed Topical Primary Dressing Maxorb Extra Ag+ Alginate Dressing, 2x2 (in/in) Discharge Instruction: Apply to wound bed as instructed Secondary Dressing Woven Gauze Sponge, Non-Sterile 4x4 in Discharge Instruction: Apply over primary dressing as directed. Secured With Compression Wrap ThreePress (3 layer compression wrap) Discharge Instruction: Apply three layer compression as directed. Compression Stockings Add-Ons BLEU, REICHEL ([id number]) [id number] Page 8 of 8 Electronic Signature(s) Signed: 04/05/2023 3:31:30 PM By: [name] Entered By: [name] on 04/05/2023 12:19:28

## 2023-04-07 ENCOUNTER — Encounter: Payer: Self-pay | Admitting: Internal Medicine

## 2023-04-08 ENCOUNTER — Encounter (HOSPITAL_BASED_OUTPATIENT_CLINIC_OR_DEPARTMENT_OTHER): Payer: Medicare Other | Admitting: General Surgery

## 2023-04-08 DIAGNOSIS — L97812 Non-pressure chronic ulcer of other part of right lower leg with fat layer exposed: Secondary | ICD-10-CM | POA: Diagnosis not present

## 2023-04-08 DIAGNOSIS — L97521 Non-pressure chronic ulcer of other part of left foot limited to breakdown of skin: Secondary | ICD-10-CM | POA: Diagnosis not present

## 2023-04-08 DIAGNOSIS — M609 Myositis, unspecified: Secondary | ICD-10-CM | POA: Diagnosis not present

## 2023-04-08 DIAGNOSIS — L942 Calcinosis cutis: Secondary | ICD-10-CM | POA: Diagnosis not present

## 2023-04-08 DIAGNOSIS — I890 Lymphedema, not elsewhere classified: Secondary | ICD-10-CM | POA: Diagnosis not present

## 2023-04-08 DIAGNOSIS — I872 Venous insufficiency (chronic) (peripheral): Secondary | ICD-10-CM | POA: Diagnosis not present

## 2023-04-08 DIAGNOSIS — L97522 Non-pressure chronic ulcer of other part of left foot with fat layer exposed: Secondary | ICD-10-CM | POA: Diagnosis not present

## 2023-04-08 NOTE — Progress Notes (Signed)
Interventions: Assess patient/caregiver ability to obtain necessary supplies Assess patient/caregiver ability to perform ulcer/skin care regimen upon admission and as needed Assess ulceration(s) every visit Provide education on ulcer and skin care Treatment Activities: Skin care regimen initiated : 02/17/2022 Topical wound management initiated : 02/17/2022 Notes: Electronic Signature(s) Signed: 04/08/2023 11:43:52 AM By: Zenaida Deed RN, BSN Entered By: Zenaida Deed on 04/08/2023 07:45:56 -------------------------------------------------------------------------------- Negative Pressure Wound Therapy Maintenance (NPWT) Details Patient Name: Date of Service: MAKI, ENSLEN 04/08/2023 8:00 A M Medical Record Number: [id number] Patient Account Number: [id number] Date of Birth/Sex: Treating RN: 08/15/1950 (72 y.o. [name] Primary Care Nyles Mitton: [name] Other Clinician: Referring Yamina Lenis: Treating Rajinder Mesick/Extender: [name] in Treatment: 60 NPWT Maintenance Performed for: Wound #3 Right, Medial Lower Leg Additional Injuries Covered: Yes Additional Injuries: Wound #6 Right, Anterior Lower Leg Performed By: Zenaida Deed, RN Type: VAC System Coverage Size (sq cm): 49.91 Pressure Type: Constant Pressure Setting: 125 mmHG Drain Type: None Primary Contact: None Sponge/Dressing Type: Foam- Black Date Initiated: 03/11/2023 Dressing Removed: Yes Quantity of Sponges/Gauze Removed: 1 Canister Changed: No Dressing Reapplied: Yes Quantity of Sponges/Gauze Inserted: 1 Respones T Treatment: o good Days On NPWT : 29 Post Procedure Diagnosis Same as Pre-procedure LOVE, LAPOLE ([id number])  [id number] Page 7 of 14 peel and place dressing, pt filling 1 canister per day Electronic Signature(s) Signed: 04/08/2023 11:43:52 AM By: Zenaida Deed RN, BSN Entered By: Zenaida Deed on 04/08/2023 08:41:10 -------------------------------------------------------------------------------- Pain Assessment Details Patient Name: Date of Service: [name] 04/08/2023 8:00 A M Medical Record Number: [id number] Patient Account Number: [id number] Date of Birth/Sex: Treating RN: 10-27-1950 (72 y.o. [name] Primary Care Berish Bohman: [name] Other Clinician: Referring Yen Wandell: Treating Alfredo Collymore/Extender: [name] in Treatment: 60 Active Problems Location of Pain Severity and Description of Pain Patient Has Paino No Site Locations Rate the pain. Current Pain Level: 0 Pain Management and Medication Current Pain Management: Electronic Signature(s) Signed: 04/08/2023 11:43:52 AM By: Zenaida Deed RN, BSN Entered By: Zenaida Deed on 04/08/2023 08:03:36 -------------------------------------------------------------------------------- Patient/Caregiver Education Details Patient Name: Date of Service: Saxby, [name] 9/13/2024andnbsp8:00 A M Medical Record Number: [id number] Patient Account Number: [id number] Date of Birth/Gender: Treating RN: Jun 12, 1951 (72 y.o. [name] Primary Care Physician: [name] Other Clinician: Referring Physician: Treating Physician/Extender: [name] in Treatment: 56 Education Assessment Education Provided To: [name] ([id number]) [id number] Page 8 of 14 Patient Education Topics Provided Infection: Methods: Explain/Verbal Responses: Reinforcements needed, State content correctly Venous: Methods: Explain/Verbal Responses: Reinforcements needed, State content correctly Wound/Skin Impairment: Methods:  Explain/Verbal Responses: Reinforcements needed, State content correctly Electronic Signature(s) Signed: 04/08/2023 11:43:52 AM By: Zenaida Deed RN, BSN Entered By: Zenaida Deed on 04/08/2023 07:46:47 -------------------------------------------------------------------------------- Wound Assessment Details Patient Name: Date of Service: [name] 04/08/2023 8:00 A M Medical Record Number: [id number] Patient Account Number: [id number] Date of Birth/Sex: Treating RN: 11-04-1950 (72 y.o. [name] Primary Care Ashyia Schraeder: [name] Other Clinician: Referring Gerlene Glassburn: Treating Millie Forde/Extender: [name] in Treatment: 60 Wound Status Wound Number: 3 Primary Lymphedema Etiology: Wound Location: Right, Medial Lower Leg Wound Open Wounding Event: Gradually Appeared Status: Date Acquired: 12/07/2021 Comorbid Cataracts, Anemia, Lymphedema, Congestive Heart Failure, Deep Weeks Of Treatment: 60 History: Vein Thrombosis, Hypertension, Peripheral Venous Disease Clustered Wound: No Photos Wound Measurements Length: (cm) 8.9 Width: (cm) 5.5 Depth: (cm) 0.3 Area: (cm) 38.445 Volume: (cm) 11.534 % Reduction in Area: -619.8% % Reduction in Volume: -440% Epithelialization: Small (  Interventions: Assess patient/caregiver ability to obtain necessary supplies Assess patient/caregiver ability to perform ulcer/skin care regimen upon admission and as needed Assess ulceration(s) every visit Provide education on ulcer and skin care Treatment Activities: Skin care regimen initiated : 02/17/2022 Topical wound management initiated : 02/17/2022 Notes: Electronic Signature(s) Signed: 04/08/2023 11:43:52 AM By: Zenaida Deed RN, BSN Entered By: Zenaida Deed on 04/08/2023 07:45:56 -------------------------------------------------------------------------------- Negative Pressure Wound Therapy Maintenance (NPWT) Details Patient Name: Date of Service: MAKI, ENSLEN 04/08/2023 8:00 A M Medical Record Number: [id number] Patient Account Number: [id number] Date of Birth/Sex: Treating RN: 08/15/1950 (72 y.o. [name] Primary Care Nyles Mitton: [name] Other Clinician: Referring Yamina Lenis: Treating Rajinder Mesick/Extender: [name] in Treatment: 60 NPWT Maintenance Performed for: Wound #3 Right, Medial Lower Leg Additional Injuries Covered: Yes Additional Injuries: Wound #6 Right, Anterior Lower Leg Performed By: Zenaida Deed, RN Type: VAC System Coverage Size (sq cm): 49.91 Pressure Type: Constant Pressure Setting: 125 mmHG Drain Type: None Primary Contact: None Sponge/Dressing Type: Foam- Black Date Initiated: 03/11/2023 Dressing Removed: Yes Quantity of Sponges/Gauze Removed: 1 Canister Changed: No Dressing Reapplied: Yes Quantity of Sponges/Gauze Inserted: 1 Respones T Treatment: o good Days On NPWT : 29 Post Procedure Diagnosis Same as Pre-procedure LOVE, LAPOLE ([id number])  [id number] Page 7 of 14 peel and place dressing, pt filling 1 canister per day Electronic Signature(s) Signed: 04/08/2023 11:43:52 AM By: Zenaida Deed RN, BSN Entered By: Zenaida Deed on 04/08/2023 08:41:10 -------------------------------------------------------------------------------- Pain Assessment Details Patient Name: Date of Service: [name] 04/08/2023 8:00 A M Medical Record Number: [id number] Patient Account Number: [id number] Date of Birth/Sex: Treating RN: 10-27-1950 (72 y.o. [name] Primary Care Berish Bohman: [name] Other Clinician: Referring Yen Wandell: Treating Alfredo Collymore/Extender: [name] in Treatment: 60 Active Problems Location of Pain Severity and Description of Pain Patient Has Paino No Site Locations Rate the pain. Current Pain Level: 0 Pain Management and Medication Current Pain Management: Electronic Signature(s) Signed: 04/08/2023 11:43:52 AM By: Zenaida Deed RN, BSN Entered By: Zenaida Deed on 04/08/2023 08:03:36 -------------------------------------------------------------------------------- Patient/Caregiver Education Details Patient Name: Date of Service: Saxby, [name] 9/13/2024andnbsp8:00 A M Medical Record Number: [id number] Patient Account Number: [id number] Date of Birth/Gender: Treating RN: Jun 12, 1951 (72 y.o. [name] Primary Care Physician: [name] Other Clinician: Referring Physician: Treating Physician/Extender: [name] in Treatment: 56 Education Assessment Education Provided To: [name] ([id number]) [id number] Page 8 of 14 Patient Education Topics Provided Infection: Methods: Explain/Verbal Responses: Reinforcements needed, State content correctly Venous: Methods: Explain/Verbal Responses: Reinforcements needed, State content correctly Wound/Skin Impairment: Methods:  Explain/Verbal Responses: Reinforcements needed, State content correctly Electronic Signature(s) Signed: 04/08/2023 11:43:52 AM By: Zenaida Deed RN, BSN Entered By: Zenaida Deed on 04/08/2023 07:46:47 -------------------------------------------------------------------------------- Wound Assessment Details Patient Name: Date of Service: [name] 04/08/2023 8:00 A M Medical Record Number: [id number] Patient Account Number: [id number] Date of Birth/Sex: Treating RN: 11-04-1950 (72 y.o. [name] Primary Care Ashyia Schraeder: [name] Other Clinician: Referring Gerlene Glassburn: Treating Millie Forde/Extender: [name] in Treatment: 60 Wound Status Wound Number: 3 Primary Lymphedema Etiology: Wound Location: Right, Medial Lower Leg Wound Open Wounding Event: Gradually Appeared Status: Date Acquired: 12/07/2021 Comorbid Cataracts, Anemia, Lymphedema, Congestive Heart Failure, Deep Weeks Of Treatment: 60 History: Vein Thrombosis, Hypertension, Peripheral Venous Disease Clustered Wound: No Photos Wound Measurements Length: (cm) 8.9 Width: (cm) 5.5 Depth: (cm) 0.3 Area: (cm) 38.445 Volume: (cm) 11.534 % Reduction in Area: -619.8% % Reduction in Volume: -440% Epithelialization: Small (  1-33%) Tunneling: No Undermining: No Wound Description Classification: Full Thickness With Exposed Support S Wound Margin: Indistinct, nonvisible Exudate Amount: Large Exudate Type: Serous Exudate Color: amber Jinkins, Aadarsh ([id number]) Wound Bed Granulation Amount: Small (1-33 Granulation Quality: Red Necrotic Amount: Large (67-1 Necrotic Quality: Adherent Sl tructures Foul Odor After Cleansing: No Slough/Fibrino Yes [id number] Page 9 of 14 %) Exposed Structure Fascia Exposed: No 00%) Fat Layer (Subcutaneous Tissue) Exposed: Yes ough Tendon Exposed: No Muscle Exposed: No Joint Exposed: No Bone Exposed: No Periwound Skin  Texture Texture Color No Abnormalities Noted: No No Abnormalities Noted: No Excoriation: No Erythema: Yes Erythema Location: Circumferential Moisture Erythema Change: Decreased No Abnormalities Noted: No Maceration: Yes Temperature / Pain Temperature: No Abnormality Treatment Notes Wound #3 (Lower Leg) Wound Laterality: Right, Medial Cleanser Peri-Wound Care Skin Prep Discharge Instruction: cavelon to macerated wound margins Sween Lotion (Moisturizing lotion) Discharge Instruction: Apply moisturizing lotion as directed Topical Gentamicin Discharge Instruction: in larger holes only Primary Dressing Maxorb Extra Ag+ Alginate Dressing, 4x4.75 (in/in) Discharge Instruction: Apply to excoriated weeping areas peel and place vac Secondary Dressing Secured With Compression Wrap ThreePress (3 layer compression wrap) Discharge Instruction: Apply three layer compression as directed. Compression Stockings Add-Ons Electronic Signature(s) Signed: 04/08/2023 11:43:52 AM By: Zenaida Deed RN, BSN Entered By: Zenaida Deed on 04/08/2023 10:27:25 -------------------------------------------------------------------------------- Wound Assessment Details Patient Name: Date of Service: [name] 04/08/2023 8:00 A M Medical Record Number: [id number] Patient Account Number: [id number] Date of Birth/Sex: Treating RN: [birth year] (72 y.o. [name] Primary Care Johnel Yielding: [name] Other Clinician: Referring Keyuna Cuthrell: Treating Bradford Cazier/Extender: [name] in Treatment: 60 Wound Status Wound Number: 6 Primary Calcinosis Etiology: Wound Location: Right, Anterior Lower Leg ELIZER, SZYMBORSKI ([id number]) [id number] Page 10 of 14 Wound Open Wounding Event: Gradually Appeared Status: Date Acquired: 05/12/2022 Comorbid Cataracts, Anemia, Lymphedema, Congestive Heart Failure, Deep Weeks Of Treatment: 47 History: Vein  Thrombosis, Hypertension, Peripheral Venous Disease Clustered Wound: Yes Photos Wound Measurements Length: (cm) Width: (cm) Depth: (cm) Clustered Quantity: Area: (cm) Volume: (cm) 2.4 % Reduction in Area: -219.5% 0.4 % Reduction in Volume: -2100% 0.7 Epithelialization: Large (67-100%) 3 Tunneling: No 0.754 Undermining: No 0.528 Wound Description Classification: Full Thickness Without Exposed Sup Wound Margin: Epibole Exudate Amount: Medium Exudate Type: Serous Exudate Color: amber port Structures Foul Odor After Cleansing: No Slough/Fibrino Yes Wound Bed Granulation Amount: Small (1-33%) Exposed Structure Granulation Quality: Red Fascia Exposed: No Necrotic Amount: Medium (34-66%) Fat Layer (Subcutaneous Tissue) Exposed: Yes Necrotic Quality: Adherent Slough Tendon Exposed: No Muscle Exposed: No Joint Exposed: No Bone Exposed: No Periwound Skin Texture Texture Color No Abnormalities Noted: Yes No Abnormalities Noted: No Hemosiderin Staining: Yes Moisture No Abnormalities Noted: Yes Temperature / Pain Temperature: No Abnormality Assessment Notes probes to calcium deposits Treatment Notes Wound #6 (Lower Leg) Wound Laterality: Right, Anterior Cleanser Peri-Wound Care Sween Lotion (Moisturizing lotion) Discharge Instruction: Apply moisturizing lotion as directed Topical Primary Dressing peel and place vac Secondary Dressing Secured With Compression Wrap ThreePress (3 layer compression wrap) Discharge Instruction: Apply three layer compression as directed. ZAMARRION, SEGARRA ([id number]) [id number] Page 11 of 14 Compression Stockings Add-Ons Electronic Signature(s) Signed: 04/08/2023 11:43:52 AM By: Zenaida Deed RN, BSN Entered By: Zenaida Deed on 04/08/2023 46:27:03 -------------------------------------------------------------------------------- Wound Assessment Details Patient Name: Date of Service: [name] 04/08/2023  8:00 A M Medical Record Number: [id number] Patient Account Number: [id number] Date of Birth/Sex: Treating RN: January 20, 1951 (72 y.o. [name] Primary Care Albertha Beattie: [name] Other Clinician: Referring Raejean Swinford: Treating Deigo Alonso/Extender: [name],  JILBERTO, LABINE ([id number]) [id number] Page 1 of 14 Visit Report for 04/08/2023 Arrival Information Details Patient Name: Date of Service: JADIAN, DROZ 04/08/2023 8:00 A M Medical Record Number: [id number] Patient Account Number: [id number] Date of Birth/Sex: Treating RN: 03-15-1951 (72 y.o. [name] Primary Care Corvette Orser: [name] Other Clinician: Referring Cortney Mckinney: Treating Ronak Duquette/Extender: [name] in Treatment: 60 Visit Information History Since Last Visit Added or deleted any medications: No Patient Arrived: Wheel Chair Any new allergies or adverse reactions: No Arrival Time: 08:02 Had a fall or experienced change in No Accompanied By: self activities of daily living that may affect Transfer Assistance: None risk of falls: Patient Identification Verified: Yes Signs or symptoms of abuse/neglect since last visito No Secondary Verification Process Completed: Yes Hospitalized since last visit: No Patient Requires Transmission-Based Precautions: No Implantable device outside of the clinic excluding No Patient Has Alerts: Yes cellular tissue based products placed in the center Patient Alerts: R ABI= N/C, TBI= .75 since last visit: L ABI =N/C, TBI = .57 Has Dressing in Place as Prescribed: Yes Has Compression in Place as Prescribed: Yes Pain Present Now: No Electronic Signature(s) Signed: 04/08/2023 11:43:52 AM By: Zenaida Deed RN, BSN Entered By: Zenaida Deed on 04/08/2023 08:02:56 -------------------------------------------------------------------------------- Compression Therapy Details Patient Name: Date of Service: [name] 04/08/2023 8:00 A M Medical Record Number: [id number] Patient Account Number: [id number] Date of Birth/Sex: Treating RN: 04-30-1951 (72 y.o. [name] Primary Care Imonie Tuch: [name] Other Clinician: Referring Ahley Bulls: Treating Calista Crain/Extender: [name] in Treatment: 60 Compression Therapy Performed for Wound Assessment: Wound #3 Right,Medial Lower Leg Performed By: Clinician Zenaida Deed, RN Compression Type: Three Layer Post Procedure Diagnosis Same as Pre-procedure Electronic Signature(s) Signed: 04/08/2023 11:43:52 AM By: Zenaida Deed RN, BSN Entered By: Zenaida Deed on 04/08/2023 08:34:33 [name] ([id number]) [id number] Page 2 of 14 -------------------------------------------------------------------------------- Compression Therapy Details Patient Name: Date of Service: ABUBAKR, JAMERSON 04/08/2023 8:00 A M Medical Record Number: [id number] Patient Account Number: [id number] Date of Birth/Sex: Treating RN: [birth year] (72 y.o. [name] Primary Care Kye Hedden: [name] Other Clinician: Referring Mateus Rewerts: Treating Alphonse Asbridge/Extender: [name] in Treatment: 60 Compression Therapy Performed for Wound Assessment: Wound #8 Left,Dorsal Foot Performed By: Clinician Zenaida Deed, RN Compression Type: Three Layer Post Procedure Diagnosis Same as Pre-procedure Electronic Signature(s) Signed: 04/08/2023 11:43:52 AM By: Zenaida Deed RN, BSN Entered By: Zenaida Deed on 04/08/2023 08:39:31 -------------------------------------------------------------------------------- Encounter Discharge Information Details Patient Name: Date of Service: [name] 04/08/2023 8:00 A M Medical Record Number: [id number] Patient Account Number: [id number] Date of Birth/Sex: Treating RN: 1950/11/13 (72 y.o. [name] Primary Care Alania Overholt: [name] Other Clinician: Referring Hafiz Irion: Treating Ernestine Rohman/Extender: [name] in Treatment: 81 Encounter Discharge Information Items Post Procedure Vitals Discharge Condition: Stable Temperature (F): 97.8 Ambulatory Status: Wheelchair Pulse (bpm):  70 Discharge Destination: Home Respiratory Rate (breaths/min): 18 Transportation: Private Auto Blood Pressure (mmHg): 151/84 Accompanied By: self Schedule Follow-up Appointment: Yes Clinical Summary of Care: Patient Declined Electronic Signature(s) Signed: 04/08/2023 11:43:52 AM By: Zenaida Deed RN, BSN Entered By: Zenaida Deed on 04/08/2023 09:44:58 -------------------------------------------------------------------------------- Lower Extremity Assessment Details Patient Name: Date of Service: MAICOL, BOBICK [name] 04/08/2023 8:00 A M Medical Record Number: [id number] Patient Account Number: [id number] Date of Birth/Sex: Treating RN: Jun 11, 1951 (72 y.o. [name] Primary Care Nusayba Cadenas: [name] Other Clinician: Referring Ioma Chismar: Treating Jilliana Burkes/Extender: [name] in Treatment: 60 Edema Assessment Assessed: [Left:  Wound Therapy Maintenance (NPWT) Wound Number: 9 N/A N/A Photos: N/A N/A Right, Lateral Lower Leg N/A N/A Wound Location: Gradually Appeared N/A N/A Wounding Event: Lymphedema N/A N/A Primary Etiology: Cataracts, Anemia, Lymphedema, N/A N/A Comorbid History: Congestive Heart Failure, Deep  Vein Thrombosis, Hypertension, Peripheral Venous Disease 03/22/2023 N/A N/A Date Acquired: 1 N/A N/A Weeks of Treatment: Open N/A N/A Wound Status: No N/A N/A Wound Recurrence: No N/A N/A Clustered Wound: N/A N/A N/A Clustered Quantity: 1.1x1.1x0.1 N/A N/A Measurements L x W x D (cm) 0.95 N/A N/A A (cm) : rea 0.095 N/A N/A Volume (cm) : 66.40% N/A N/A % Reduction in Area: 66.40% N/A N/A % Reduction in Volume: Full Thickness Without Exposed N/A N/A Classification: Support Structures Medium N/A N/A Exudate Amount: Serous N/A N/A Exudate Type: amber N/A N/A Exudate Color: Flat and Intact N/A N/A Wound Margin: Large (67-100%) N/A N/A Granulation Amount: Red N/A N/A Granulation Quality: None Present (0%) N/A N/A Necrotic Amount: Fat Layer (Subcutaneous Tissue): Yes N/A N/A Exposed Structures: Fascia: No Tendon: No Muscle: No Joint: No JOHNTAVIOUS, BRICKLER ([id number]) [id number] Page 5 of 14 Bone: No Medium (34-66%) N/A N/A Epithelialization: N/A N/A N/A Debridement: N/A N/A N/A Pain Control: N/A N/A N/A Tissue Debrided: N/A N/A N/A Level: N/A N/A N/A Debridement A (sq cm): rea N/A N/A N/A Instrument: N/A N/A N/A Bleeding: N/A N/A N/A Procedural Pain: N/A N/A N/A Post Procedural Pain: N/A N/A N/A Debridement Treatment Response: N/A N/A N/A Post Debridement Measurements L x W x D (cm) N/A N/A N/A Post Debridement Volume: (cm) No Abnormalities Noted N/A N/A Periwound Skin Texture: No Abnormalities Noted N/A N/A Periwound Skin Moisture: Erythema: No N/A N/A Periwound Skin Color: N/A N/A N/A Erythema Location: N/A N/A N/A Erythema Change: No Abnormality N/A N/A Temperature: N/A N/A N/A Assessment Notes: N/A N/A N/A Procedures Performed: Treatment Notes Electronic Signature(s) Signed: 04/08/2023 8:53:45 AM By: Duanne Guess MD FACS Entered By: Duanne Guess on 04/08/2023  08:53:45 -------------------------------------------------------------------------------- Multi-Disciplinary Care Plan Details Patient Name: Date of Service: [name] 04/08/2023 8:00 A M Medical Record Number: [id number] Patient Account Number: [id number] Date of Birth/Sex: Treating RN: [birth year] (72 y.o. [name] Primary Care Ader Fritze: [name] Other Clinician: Referring Mirl Hillery: Treating Rahkeem Senft/Extender: [name] in Treatment: 60 Multidisciplinary Care Plan reviewed with physician Active Inactive Venous Leg Ulcer Nursing Diagnoses: Knowledge deficit related to disease process and management Potential for venous Insuffiency (use before diagnosis confirmed) Goals: Patient will maintain optimal edema control Date Initiated: 02/17/2022 Target Resolution Date: 04/22/2023 Goal Status: Active Interventions: Assess peripheral edema status every visit. Compression as ordered Provide education on venous insufficiency Treatment Activities: Non-invasive vascular studies : 02/17/2022 Therapeutic compression applied : 02/17/2022 Notes: Wound/Skin Impairment Nursing Diagnoses: ITSUKI, RIPOLL ([id number]) [id number] Page 6 of 14 Impaired tissue integrity Knowledge deficit related to ulceration/compromised skin integrity Goals: Patient/caregiver will verbalize understanding of skin care regimen Date Initiated: 02/17/2022 Target Resolution Date: 04/22/2023 Goal Status: Active Ulcer/skin breakdown will have a volume reduction of 30% by week 4 Date Initiated: 02/17/2022 Date Inactivated: 03/10/2022 Target Resolution Date: 03/10/2022 Unmet Reason: infection being treated, Goal Status: Unmet waiting on derm appte Ulcer/skin breakdown will have a volume reduction of 50% by week 8 Date Initiated: 03/10/2022 Date Inactivated: 04/14/2022 Target Resolution Date: 04/07/2022 Goal Status: Unmet Unmet Reason:  calcinosis Ulcer/skin breakdown will have a volume reduction of 80% by week 12 Date Initiated: 04/14/2022 Date Inactivated: 05/05/2022 Target Resolution Date: 05/05/2022 Unmet Reason: infection, chronic Goal Status: Unmet condition  Interventions: Assess patient/caregiver ability to obtain necessary supplies Assess patient/caregiver ability to perform ulcer/skin care regimen upon admission and as needed Assess ulceration(s) every visit Provide education on ulcer and skin care Treatment Activities: Skin care regimen initiated : 02/17/2022 Topical wound management initiated : 02/17/2022 Notes: Electronic Signature(s) Signed: 04/08/2023 11:43:52 AM By: Zenaida Deed RN, BSN Entered By: Zenaida Deed on 04/08/2023 07:45:56 -------------------------------------------------------------------------------- Negative Pressure Wound Therapy Maintenance (NPWT) Details Patient Name: Date of Service: MAKI, ENSLEN 04/08/2023 8:00 A M Medical Record Number: [id number] Patient Account Number: [id number] Date of Birth/Sex: Treating RN: 08/15/1950 (72 y.o. [name] Primary Care Nyles Mitton: [name] Other Clinician: Referring Yamina Lenis: Treating Rajinder Mesick/Extender: [name] in Treatment: 60 NPWT Maintenance Performed for: Wound #3 Right, Medial Lower Leg Additional Injuries Covered: Yes Additional Injuries: Wound #6 Right, Anterior Lower Leg Performed By: Zenaida Deed, RN Type: VAC System Coverage Size (sq cm): 49.91 Pressure Type: Constant Pressure Setting: 125 mmHG Drain Type: None Primary Contact: None Sponge/Dressing Type: Foam- Black Date Initiated: 03/11/2023 Dressing Removed: Yes Quantity of Sponges/Gauze Removed: 1 Canister Changed: No Dressing Reapplied: Yes Quantity of Sponges/Gauze Inserted: 1 Respones T Treatment: o good Days On NPWT : 29 Post Procedure Diagnosis Same as Pre-procedure LOVE, LAPOLE ([id number])  [id number] Page 7 of 14 peel and place dressing, pt filling 1 canister per day Electronic Signature(s) Signed: 04/08/2023 11:43:52 AM By: Zenaida Deed RN, BSN Entered By: Zenaida Deed on 04/08/2023 08:41:10 -------------------------------------------------------------------------------- Pain Assessment Details Patient Name: Date of Service: [name] 04/08/2023 8:00 A M Medical Record Number: [id number] Patient Account Number: [id number] Date of Birth/Sex: Treating RN: 10-27-1950 (72 y.o. [name] Primary Care Berish Bohman: [name] Other Clinician: Referring Yen Wandell: Treating Alfredo Collymore/Extender: [name] in Treatment: 60 Active Problems Location of Pain Severity and Description of Pain Patient Has Paino No Site Locations Rate the pain. Current Pain Level: 0 Pain Management and Medication Current Pain Management: Electronic Signature(s) Signed: 04/08/2023 11:43:52 AM By: Zenaida Deed RN, BSN Entered By: Zenaida Deed on 04/08/2023 08:03:36 -------------------------------------------------------------------------------- Patient/Caregiver Education Details Patient Name: Date of Service: Saxby, [name] 9/13/2024andnbsp8:00 A M Medical Record Number: [id number] Patient Account Number: [id number] Date of Birth/Gender: Treating RN: Jun 12, 1951 (72 y.o. [name] Primary Care Physician: [name] Other Clinician: Referring Physician: Treating Physician/Extender: [name] in Treatment: 56 Education Assessment Education Provided To: [name] ([id number]) [id number] Page 8 of 14 Patient Education Topics Provided Infection: Methods: Explain/Verbal Responses: Reinforcements needed, State content correctly Venous: Methods: Explain/Verbal Responses: Reinforcements needed, State content correctly Wound/Skin Impairment: Methods:  Explain/Verbal Responses: Reinforcements needed, State content correctly Electronic Signature(s) Signed: 04/08/2023 11:43:52 AM By: Zenaida Deed RN, BSN Entered By: Zenaida Deed on 04/08/2023 07:46:47 -------------------------------------------------------------------------------- Wound Assessment Details Patient Name: Date of Service: [name] 04/08/2023 8:00 A M Medical Record Number: [id number] Patient Account Number: [id number] Date of Birth/Sex: Treating RN: 11-04-1950 (72 y.o. [name] Primary Care Ashyia Schraeder: [name] Other Clinician: Referring Gerlene Glassburn: Treating Millie Forde/Extender: [name] in Treatment: 60 Wound Status Wound Number: 3 Primary Lymphedema Etiology: Wound Location: Right, Medial Lower Leg Wound Open Wounding Event: Gradually Appeared Status: Date Acquired: 12/07/2021 Comorbid Cataracts, Anemia, Lymphedema, Congestive Heart Failure, Deep Weeks Of Treatment: 60 History: Vein Thrombosis, Hypertension, Peripheral Venous Disease Clustered Wound: No Photos Wound Measurements Length: (cm) 8.9 Width: (cm) 5.5 Depth: (cm) 0.3 Area: (cm) 38.445 Volume: (cm) 11.534 % Reduction in Area: -619.8% % Reduction in Volume: -440% Epithelialization: Small (  JILBERTO, LABINE ([id number]) [id number] Page 1 of 14 Visit Report for 04/08/2023 Arrival Information Details Patient Name: Date of Service: JADIAN, DROZ 04/08/2023 8:00 A M Medical Record Number: [id number] Patient Account Number: [id number] Date of Birth/Sex: Treating RN: 03-15-1951 (72 y.o. [name] Primary Care Corvette Orser: [name] Other Clinician: Referring Cortney Mckinney: Treating Ronak Duquette/Extender: [name] in Treatment: 60 Visit Information History Since Last Visit Added or deleted any medications: No Patient Arrived: Wheel Chair Any new allergies or adverse reactions: No Arrival Time: 08:02 Had a fall or experienced change in No Accompanied By: self activities of daily living that may affect Transfer Assistance: None risk of falls: Patient Identification Verified: Yes Signs or symptoms of abuse/neglect since last visito No Secondary Verification Process Completed: Yes Hospitalized since last visit: No Patient Requires Transmission-Based Precautions: No Implantable device outside of the clinic excluding No Patient Has Alerts: Yes cellular tissue based products placed in the center Patient Alerts: R ABI= N/C, TBI= .75 since last visit: L ABI =N/C, TBI = .57 Has Dressing in Place as Prescribed: Yes Has Compression in Place as Prescribed: Yes Pain Present Now: No Electronic Signature(s) Signed: 04/08/2023 11:43:52 AM By: Zenaida Deed RN, BSN Entered By: Zenaida Deed on 04/08/2023 08:02:56 -------------------------------------------------------------------------------- Compression Therapy Details Patient Name: Date of Service: [name] 04/08/2023 8:00 A M Medical Record Number: [id number] Patient Account Number: [id number] Date of Birth/Sex: Treating RN: 04-30-1951 (72 y.o. [name] Primary Care Imonie Tuch: [name] Other Clinician: Referring Ahley Bulls: Treating Calista Crain/Extender: [name] in Treatment: 60 Compression Therapy Performed for Wound Assessment: Wound #3 Right,Medial Lower Leg Performed By: Clinician Zenaida Deed, RN Compression Type: Three Layer Post Procedure Diagnosis Same as Pre-procedure Electronic Signature(s) Signed: 04/08/2023 11:43:52 AM By: Zenaida Deed RN, BSN Entered By: Zenaida Deed on 04/08/2023 08:34:33 [name] ([id number]) [id number] Page 2 of 14 -------------------------------------------------------------------------------- Compression Therapy Details Patient Name: Date of Service: ABUBAKR, JAMERSON 04/08/2023 8:00 A M Medical Record Number: [id number] Patient Account Number: [id number] Date of Birth/Sex: Treating RN: [birth year] (72 y.o. [name] Primary Care Kye Hedden: [name] Other Clinician: Referring Mateus Rewerts: Treating Alphonse Asbridge/Extender: [name] in Treatment: 60 Compression Therapy Performed for Wound Assessment: Wound #8 Left,Dorsal Foot Performed By: Clinician Zenaida Deed, RN Compression Type: Three Layer Post Procedure Diagnosis Same as Pre-procedure Electronic Signature(s) Signed: 04/08/2023 11:43:52 AM By: Zenaida Deed RN, BSN Entered By: Zenaida Deed on 04/08/2023 08:39:31 -------------------------------------------------------------------------------- Encounter Discharge Information Details Patient Name: Date of Service: [name] 04/08/2023 8:00 A M Medical Record Number: [id number] Patient Account Number: [id number] Date of Birth/Sex: Treating RN: 1950/11/13 (72 y.o. [name] Primary Care Alania Overholt: [name] Other Clinician: Referring Hafiz Irion: Treating Ernestine Rohman/Extender: [name] in Treatment: 81 Encounter Discharge Information Items Post Procedure Vitals Discharge Condition: Stable Temperature (F): 97.8 Ambulatory Status: Wheelchair Pulse (bpm):  70 Discharge Destination: Home Respiratory Rate (breaths/min): 18 Transportation: Private Auto Blood Pressure (mmHg): 151/84 Accompanied By: self Schedule Follow-up Appointment: Yes Clinical Summary of Care: Patient Declined Electronic Signature(s) Signed: 04/08/2023 11:43:52 AM By: Zenaida Deed RN, BSN Entered By: Zenaida Deed on 04/08/2023 09:44:58 -------------------------------------------------------------------------------- Lower Extremity Assessment Details Patient Name: Date of Service: MAICOL, BOBICK [name] 04/08/2023 8:00 A M Medical Record Number: [id number] Patient Account Number: [id number] Date of Birth/Sex: Treating RN: Jun 11, 1951 (72 y.o. [name] Primary Care Nusayba Cadenas: [name] Other Clinician: Referring Ioma Chismar: Treating Jilliana Burkes/Extender: [name] in Treatment: 60 Edema Assessment Assessed: [Left:

## 2023-04-08 NOTE — Progress Notes (Signed)
Medical Record Number: [id number] Patient Account Number: [id number] Date of Birth/Sex: Treating RN: [birth year] (72 y.o. M) Primary Care Provider: [name] Other Clinician: Referring Provider: Treating Provider/Extender: [name] in Treatment: 60 Constitutional Hypertensive, asymptomatic. . . . no acute distress. Respiratory Normal work of breathing on room air. Notes 04/08/2023: Since starting antibiotics last week, there has been tremendous improvement. The left dorsal foot wound is nearly closed with just a little bit  of eschar on the surface. The right medial lower leg is less excoriated and red and he has had substantially less drainage. The amount of slough on the open areas of his wounds has decreased markedly and there is no longer any odor. The back of his right lower leg that was newly open last week has dried up, but is not completely closed yet. Electronic Signature(s) Signed: 04/08/2023 9:07:36 AM By: Duanne Guess MD FACS Previous Signature: 04/08/2023 8:59:03 AM Version By: Duanne Guess MD FACS Entered By: Duanne Guess on 04/08/2023 09:07:36 -------------------------------------------------------------------------------- Physician Orders Details Patient Name: Date of Service: [name] 04/08/2023 8:00 A M Medical Record Number: [id number] Patient Account Number: [id number] Date of Birth/Sex: Treating RN: March 11, 1951 (72 y.o. 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 - Dr. [name] RM 1 Return A Friday 9/20 @ 11:00 am Nurse Visit: - Tues. 9/17 @ 3:00  pm Anesthetic (In clinic) Topical Lidocaine 4% applied to wound bed Bathing/ Shower/ Hygiene May shower with protection but do not get wound dressing(s) wet. Protect dressing(s) with water repellant cover (for example, large plastic bag) or a cast cover and may then take shower. Negative Presssure Wound Therapy Wound Vac to wound continuously at 140mm/hg pressure Black Foam - peel and place Edema Control - Lymphedema / SCD / Other Lymphedema Pumps. Use Lymphedema pumps on leg(s) 2-3 times a day for 45-60 minutes. If wearing any wraps or hose, do not remove them. Continue exercising as instructed. Elevate legs to the level of the heart or above for 30 minutes daily and/or when sitting for 3-4 times a day throughout the day. - throughout the day Avoid standing for long periods of time. Exercise regularly Wound Treatment Wound #3 - Lower Leg Wound Laterality: Right, Medial Peri-Wound Care: Skin Prep 1 x Per Week/30 Days Discharge Instructions: cavelon to macerated wound margins Peri-Wound Care: Sween Lotion (Moisturizing lotion) 1 x Per Week/30 Days Discharge Instructions: Apply moisturizing lotion as directed Topical: Gentamicin 1 x Per Week/30 Days Discharge Instructions: in larger holes only Prim Dressing: Maxorb Extra Ag+ Alginate Dressing, 4x4.75 (in/in) 1 x Per Week/30 Days ary Discharge Instructions: Apply to excoriated weeping areas Prim Dressing: peel and place vac ary 1 x Per Week/30 Days Compression Wrap: ThreePress (3 layer compression wrap) 1 x Per Week/30 Days Discharge Instructions: Apply three layer  DECORY, FRENI ([id number]) [id number] Page 1 of 17 Visit Report for 04/08/2023 Chief Complaint Document Details Patient Name: Date of Service: WYLLIAM, HANEK 04/08/2023 8:00 A M Medical Record Number: [id number] Patient Account Number: [id number] Date of Birth/Sex: Treating RN: [birth year] (72 y.o. M) Primary Care Provider: [name] Other Clinician: Referring Provider: Treating Provider/Extender: [name] in Treatment: 60 Information Obtained from: Patient Chief Complaint Patient seen for complaints of Non-Healing Wounds. Electronic Signature(s) Signed: 04/08/2023 8:54:43 AM By: Duanne Guess MD FACS Entered By: Duanne Guess on 04/08/2023 08:54:43 -------------------------------------------------------------------------------- Debridement Details Patient Name: Date of Service: [name] 04/08/2023 8:00 A M Medical Record Number: [id number] Patient Account Number: [id number] Date of Birth/Sex: Treating RN: November 15, 1950 (72 y.o. [name] Primary Care Provider: [name] Other Clinician: Referring Provider: Treating Provider/Extender: [name] in Treatment: 60 Debridement Performed for Assessment: Wound #8 Left,Dorsal Foot Performed By: Physician Duanne Guess, MD The following information was scribed by: Zenaida Deed The information was scribed for: Duanne Guess Debridement Type: Debridement Level of Consciousness (Pre-procedure): Awake and Alert Pre-procedure Verification/Time Out Yes - 08:35 Taken: Start Time: 08:35 Pain Control: Lidocaine 4% Topical Solution Percent of Wound Bed Debrided: 200% T Area Debrided (cm): otal 0.19 Tissue and other material debrided: Non-Viable, Eschar Level: Non-Viable Tissue Debridement Description: Selective/Open Wound Instrument: Curette Bleeding: None Procedural Pain: 0 Post Procedural Pain: 0 Response to Treatment:  Procedure was tolerated well Level of Consciousness (Post- Awake and Alert procedure): Post Debridement Measurements of Total Wound Length: (cm) 0.4 Width: (cm) 0.3 Depth: (cm) 0.1 Volume: (cm) 0.009 Character of Wound/Ulcer Post Debridement: Improved TRAYSHAUN, CLERE ([id number]) [id number] Page 2 of 17 Post Procedure Diagnosis Same as Pre-procedure Electronic Signature(s) Signed: 04/08/2023 10:39:27 AM By: Duanne Guess MD FACS Signed: 04/08/2023 11:43:52 AM By: Zenaida Deed RN, BSN Entered By: Zenaida Deed on 04/08/2023 08:36:13 -------------------------------------------------------------------------------- Debridement Details Patient Name: Date of Service: [name] 04/08/2023 8:00 A M Medical Record Number: [id number] Patient Account Number: [id number] Date of Birth/Sex: Treating RN: June 28, 1951 (72 y.o. [name] Primary Care Provider: [name] Other Clinician: Referring Provider: Treating Provider/Extender: [name] in Treatment: 60 Debridement Performed for Assessment: Wound #3 Right,Medial Lower Leg Performed By: Physician Duanne Guess, MD The following information was scribed by: Zenaida Deed The information was scribed for: Duanne Guess Debridement Type: Debridement Level of Consciousness (Pre-procedure): Awake and Alert Pre-procedure Verification/Time Out Yes - 08:35 Taken: Start Time: 08:35 Pain Control: Lidocaine 4% T opical Solution Percent of Wound Bed Debrided: 75% T Area Debrided (cm): otal 28.82 Tissue and other material debrided: Non-Viable, Slough, Slough Level: Non-Viable Tissue Debridement Description: Selective/Open Wound Instrument: Curette Bleeding: None Procedural Pain: 0 Post Procedural Pain: 0 Response to Treatment: Procedure was tolerated well Level of Consciousness (Post- Awake and Alert procedure): Post Debridement Measurements of Total  Wound Length: (cm) 8.9 Width: (cm) 5.5 Depth: (cm) 0.3 Volume: (cm) 11.534 Character of Wound/Ulcer Post Debridement: Improved Post Procedure Diagnosis Same as Pre-procedure Electronic Signature(s) Signed: 04/08/2023 10:39:27 AM By: Duanne Guess MD FACS Signed: 04/08/2023 11:43:52 AM By: Zenaida Deed RN, BSN Entered By: Zenaida Deed on 04/08/2023 08:37:51 -------------------------------------------------------------------------------- HPI Details Patient Name: Date of Service: [name] 04/08/2023 8:00 A Barnie Del ([id number]) [id number] Page 3 of 17 Medical Record Number: [id number] Patient Account Number: [id number] Date of Birth/Sex: Treating RN: Mar 13, 1951 (72 y.o. M) Primary Care Provider: [name] Other Clinician: Referring Provider: Treating Provider/Extender: [name] in Treatment:  compression as directed. Wound #6 - Lower Leg Wound Laterality: Right, Anterior Peri-Wound Care: Sween Lotion (Moisturizing lotion) 1 x Per Week/30 Days Discharge Instructions: Apply moisturizing lotion as directed Prim Dressing: peel and place vac ary 1 x Per Week/30 Days Compression Wrap: ThreePress (3 layer compression wrap) 1 x Per Week/30 Days Discharge Instructions:  Apply three layer compression as directed. Wound #8 - Foot Wound Laterality: Dorsal, Left Cleanser: Soap and Water 1 x Per Week/30 Days Discharge Instructions: May shower and wash wound with dial antibacterial soap and water prior to dressing change. Peri-Wound Care: Sween Lotion (Moisturizing lotion) 1 x Per Week/30 Days Discharge Instructions: Apply moisturizing lotion as directed Prim Dressing: Maxorb Extra Ag+ Alginate Dressing, 2x2 (in/in) 1 x Per Week/30 Days ary Discharge Instructions: Apply to wound bed as instructed Secondary Dressing: Woven Gauze Sponge, Non-Sterile 4x4 in 1 x Per Week/30 Days Discharge Instructions: Apply over primary dressing as directed. Compression Wrap: ThreePress (3 layer compression wrap) 1 x Per Week/30 Days Discharge Instructions: Apply three layer compression as directed. Wound #9 - Lower Leg Wound Laterality: Right, Lateral Cleanser: Soap and Water 1 x Per Week/30 Days Discharge Instructions: May shower and wash wound with dial antibacterial soap and water prior to dressing change. Peri-Wound Care: Sween Lotion (Moisturizing lotion) 1 x Per Week/30 Days Discharge Instructions: Apply moisturizing lotion as directed VINAL, NOSBISCH ([id number]) [id number] Page 8 of 17 Prim Dressing: Maxorb Extra Ag+ Alginate Dressing, 2x2 (in/in) 1 x Per Week/30 Days ary Discharge Instructions: Apply to wound bed as instructed Secondary Dressing: Woven Gauze Sponge, Non-Sterile 4x4 in 1 x Per Week/30 Days Discharge Instructions: Apply over primary dressing as directed. Compression Wrap: ThreePress (3 layer compression wrap) 1 x Per Week/30 Days Discharge Instructions: Apply three layer compression as directed. Electronic Signature(s) Signed: 04/08/2023 10:39:27 AM By: Duanne Guess MD FACS Entered By: Duanne Guess on 04/08/2023 09:08:06 -------------------------------------------------------------------------------- Problem List  Details Patient Name: Date of Service: [name] 04/08/2023 8:00 A M Medical Record Number: [id number] Patient Account Number: [id number] Date of Birth/Sex: Treating RN: 12-05-1950 (72 y.o. 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 Venous insufficiency (chronic) (peripheral) 02/09/2022 No Yes I89.0 Lymphedema, not elsewhere classified 02/09/2022 No Yes I10 Essential (primary) hypertension 02/09/2022 No Yes I25.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 angina pectoris 02/09/2022 No Yes Inactive Problems ICD-10 JAIRIUS, ARSENEAU ([id number]) [id number] Page 9 of 17 Code Description Active Date Inactive Date L97.512 Non-pressure chronic ulcer of other part of right foot with fat layer exposed 02/17/2022 02/17/2022 Resolved Problems ICD-10 Code Description Active Date Resolved Date L97.821 Non-pressure chronic ulcer of other part of left lower leg limited to breakdown of skin 03/11/2023 03/11/2023 L98.492 Non-pressure chronic ulcer of skin of other sites with fat layer exposed 03/11/2023 03/11/2023 Electronic Signature(s) Signed: 04/08/2023 8:39:12 AM By: Duanne Guess MD FACS Entered By: Duanne Guess on 04/08/2023 08:39:12 -------------------------------------------------------------------------------- Progress Note Details Patient Name: Date of Service: [name] 04/08/2023 8:00 A  M Medical Record Number: [id number] Patient Account Number: [id number] Date of Birth/Sex: Treating  compression as directed. Wound #6 - Lower Leg Wound Laterality: Right, Anterior Peri-Wound Care: Sween Lotion (Moisturizing lotion) 1 x Per Week/30 Days Discharge Instructions: Apply moisturizing lotion as directed Prim Dressing: peel and place vac ary 1 x Per Week/30 Days Compression Wrap: ThreePress (3 layer compression wrap) 1 x Per Week/30 Days Discharge Instructions:  Apply three layer compression as directed. Wound #8 - Foot Wound Laterality: Dorsal, Left Cleanser: Soap and Water 1 x Per Week/30 Days Discharge Instructions: May shower and wash wound with dial antibacterial soap and water prior to dressing change. Peri-Wound Care: Sween Lotion (Moisturizing lotion) 1 x Per Week/30 Days Discharge Instructions: Apply moisturizing lotion as directed Prim Dressing: Maxorb Extra Ag+ Alginate Dressing, 2x2 (in/in) 1 x Per Week/30 Days ary Discharge Instructions: Apply to wound bed as instructed Secondary Dressing: Woven Gauze Sponge, Non-Sterile 4x4 in 1 x Per Week/30 Days Discharge Instructions: Apply over primary dressing as directed. Compression Wrap: ThreePress (3 layer compression wrap) 1 x Per Week/30 Days Discharge Instructions: Apply three layer compression as directed. Wound #9 - Lower Leg Wound Laterality: Right, Lateral Cleanser: Soap and Water 1 x Per Week/30 Days Discharge Instructions: May shower and wash wound with dial antibacterial soap and water prior to dressing change. Peri-Wound Care: Sween Lotion (Moisturizing lotion) 1 x Per Week/30 Days Discharge Instructions: Apply moisturizing lotion as directed VINAL, NOSBISCH ([id number]) [id number] Page 8 of 17 Prim Dressing: Maxorb Extra Ag+ Alginate Dressing, 2x2 (in/in) 1 x Per Week/30 Days ary Discharge Instructions: Apply to wound bed as instructed Secondary Dressing: Woven Gauze Sponge, Non-Sterile 4x4 in 1 x Per Week/30 Days Discharge Instructions: Apply over primary dressing as directed. Compression Wrap: ThreePress (3 layer compression wrap) 1 x Per Week/30 Days Discharge Instructions: Apply three layer compression as directed. Electronic Signature(s) Signed: 04/08/2023 10:39:27 AM By: Duanne Guess MD FACS Entered By: Duanne Guess on 04/08/2023 09:08:06 -------------------------------------------------------------------------------- Problem List  Details Patient Name: Date of Service: [name] 04/08/2023 8:00 A M Medical Record Number: [id number] Patient Account Number: [id number] Date of Birth/Sex: Treating RN: 12-05-1950 (72 y.o. 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 Venous insufficiency (chronic) (peripheral) 02/09/2022 No Yes I89.0 Lymphedema, not elsewhere classified 02/09/2022 No Yes I10 Essential (primary) hypertension 02/09/2022 No Yes I25.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 angina pectoris 02/09/2022 No Yes Inactive Problems ICD-10 JAIRIUS, ARSENEAU ([id number]) [id number] Page 9 of 17 Code Description Active Date Inactive Date L97.512 Non-pressure chronic ulcer of other part of right foot with fat layer exposed 02/17/2022 02/17/2022 Resolved Problems ICD-10 Code Description Active Date Resolved Date L97.821 Non-pressure chronic ulcer of other part of left lower leg limited to breakdown of skin 03/11/2023 03/11/2023 L98.492 Non-pressure chronic ulcer of skin of other sites with fat layer exposed 03/11/2023 03/11/2023 Electronic Signature(s) Signed: 04/08/2023 8:39:12 AM By: Duanne Guess MD FACS Entered By: Duanne Guess on 04/08/2023 08:39:12 -------------------------------------------------------------------------------- Progress Note Details Patient Name: Date of Service: [name] 04/08/2023 8:00 A  M Medical Record Number: [id number] Patient Account Number: [id number] Date of Birth/Sex: Treating  compression as directed. Wound #6 - Lower Leg Wound Laterality: Right, Anterior Peri-Wound Care: Sween Lotion (Moisturizing lotion) 1 x Per Week/30 Days Discharge Instructions: Apply moisturizing lotion as directed Prim Dressing: peel and place vac ary 1 x Per Week/30 Days Compression Wrap: ThreePress (3 layer compression wrap) 1 x Per Week/30 Days Discharge Instructions:  Apply three layer compression as directed. Wound #8 - Foot Wound Laterality: Dorsal, Left Cleanser: Soap and Water 1 x Per Week/30 Days Discharge Instructions: May shower and wash wound with dial antibacterial soap and water prior to dressing change. Peri-Wound Care: Sween Lotion (Moisturizing lotion) 1 x Per Week/30 Days Discharge Instructions: Apply moisturizing lotion as directed Prim Dressing: Maxorb Extra Ag+ Alginate Dressing, 2x2 (in/in) 1 x Per Week/30 Days ary Discharge Instructions: Apply to wound bed as instructed Secondary Dressing: Woven Gauze Sponge, Non-Sterile 4x4 in 1 x Per Week/30 Days Discharge Instructions: Apply over primary dressing as directed. Compression Wrap: ThreePress (3 layer compression wrap) 1 x Per Week/30 Days Discharge Instructions: Apply three layer compression as directed. Wound #9 - Lower Leg Wound Laterality: Right, Lateral Cleanser: Soap and Water 1 x Per Week/30 Days Discharge Instructions: May shower and wash wound with dial antibacterial soap and water prior to dressing change. Peri-Wound Care: Sween Lotion (Moisturizing lotion) 1 x Per Week/30 Days Discharge Instructions: Apply moisturizing lotion as directed VINAL, NOSBISCH ([id number]) [id number] Page 8 of 17 Prim Dressing: Maxorb Extra Ag+ Alginate Dressing, 2x2 (in/in) 1 x Per Week/30 Days ary Discharge Instructions: Apply to wound bed as instructed Secondary Dressing: Woven Gauze Sponge, Non-Sterile 4x4 in 1 x Per Week/30 Days Discharge Instructions: Apply over primary dressing as directed. Compression Wrap: ThreePress (3 layer compression wrap) 1 x Per Week/30 Days Discharge Instructions: Apply three layer compression as directed. Electronic Signature(s) Signed: 04/08/2023 10:39:27 AM By: Duanne Guess MD FACS Entered By: Duanne Guess on 04/08/2023 09:08:06 -------------------------------------------------------------------------------- Problem List  Details Patient Name: Date of Service: [name] 04/08/2023 8:00 A M Medical Record Number: [id number] Patient Account Number: [id number] Date of Birth/Sex: Treating RN: 12-05-1950 (72 y.o. 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 Venous insufficiency (chronic) (peripheral) 02/09/2022 No Yes I89.0 Lymphedema, not elsewhere classified 02/09/2022 No Yes I10 Essential (primary) hypertension 02/09/2022 No Yes I25.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 angina pectoris 02/09/2022 No Yes Inactive Problems ICD-10 JAIRIUS, ARSENEAU ([id number]) [id number] Page 9 of 17 Code Description Active Date Inactive Date L97.512 Non-pressure chronic ulcer of other part of right foot with fat layer exposed 02/17/2022 02/17/2022 Resolved Problems ICD-10 Code Description Active Date Resolved Date L97.821 Non-pressure chronic ulcer of other part of left lower leg limited to breakdown of skin 03/11/2023 03/11/2023 L98.492 Non-pressure chronic ulcer of skin of other sites with fat layer exposed 03/11/2023 03/11/2023 Electronic Signature(s) Signed: 04/08/2023 8:39:12 AM By: Duanne Guess MD FACS Entered By: Duanne Guess on 04/08/2023 08:39:12 -------------------------------------------------------------------------------- Progress Note Details Patient Name: Date of Service: [name] 04/08/2023 8:00 A  M Medical Record Number: [id number] Patient Account Number: [id number] Date of Birth/Sex: Treating  DECORY, FRENI ([id number]) [id number] Page 1 of 17 Visit Report for 04/08/2023 Chief Complaint Document Details Patient Name: Date of Service: WYLLIAM, HANEK 04/08/2023 8:00 A M Medical Record Number: [id number] Patient Account Number: [id number] Date of Birth/Sex: Treating RN: [birth year] (72 y.o. M) Primary Care Provider: [name] Other Clinician: Referring Provider: Treating Provider/Extender: [name] in Treatment: 60 Information Obtained from: Patient Chief Complaint Patient seen for complaints of Non-Healing Wounds. Electronic Signature(s) Signed: 04/08/2023 8:54:43 AM By: Duanne Guess MD FACS Entered By: Duanne Guess on 04/08/2023 08:54:43 -------------------------------------------------------------------------------- Debridement Details Patient Name: Date of Service: [name] 04/08/2023 8:00 A M Medical Record Number: [id number] Patient Account Number: [id number] Date of Birth/Sex: Treating RN: November 15, 1950 (72 y.o. [name] Primary Care Provider: [name] Other Clinician: Referring Provider: Treating Provider/Extender: [name] in Treatment: 60 Debridement Performed for Assessment: Wound #8 Left,Dorsal Foot Performed By: Physician Duanne Guess, MD The following information was scribed by: Zenaida Deed The information was scribed for: Duanne Guess Debridement Type: Debridement Level of Consciousness (Pre-procedure): Awake and Alert Pre-procedure Verification/Time Out Yes - 08:35 Taken: Start Time: 08:35 Pain Control: Lidocaine 4% Topical Solution Percent of Wound Bed Debrided: 200% T Area Debrided (cm): otal 0.19 Tissue and other material debrided: Non-Viable, Eschar Level: Non-Viable Tissue Debridement Description: Selective/Open Wound Instrument: Curette Bleeding: None Procedural Pain: 0 Post Procedural Pain: 0 Response to Treatment:  Procedure was tolerated well Level of Consciousness (Post- Awake and Alert procedure): Post Debridement Measurements of Total Wound Length: (cm) 0.4 Width: (cm) 0.3 Depth: (cm) 0.1 Volume: (cm) 0.009 Character of Wound/Ulcer Post Debridement: Improved TRAYSHAUN, CLERE ([id number]) [id number] Page 2 of 17 Post Procedure Diagnosis Same as Pre-procedure Electronic Signature(s) Signed: 04/08/2023 10:39:27 AM By: Duanne Guess MD FACS Signed: 04/08/2023 11:43:52 AM By: Zenaida Deed RN, BSN Entered By: Zenaida Deed on 04/08/2023 08:36:13 -------------------------------------------------------------------------------- Debridement Details Patient Name: Date of Service: [name] 04/08/2023 8:00 A M Medical Record Number: [id number] Patient Account Number: [id number] Date of Birth/Sex: Treating RN: June 28, 1951 (72 y.o. [name] Primary Care Provider: [name] Other Clinician: Referring Provider: Treating Provider/Extender: [name] in Treatment: 60 Debridement Performed for Assessment: Wound #3 Right,Medial Lower Leg Performed By: Physician Duanne Guess, MD The following information was scribed by: Zenaida Deed The information was scribed for: Duanne Guess Debridement Type: Debridement Level of Consciousness (Pre-procedure): Awake and Alert Pre-procedure Verification/Time Out Yes - 08:35 Taken: Start Time: 08:35 Pain Control: Lidocaine 4% T opical Solution Percent of Wound Bed Debrided: 75% T Area Debrided (cm): otal 28.82 Tissue and other material debrided: Non-Viable, Slough, Slough Level: Non-Viable Tissue Debridement Description: Selective/Open Wound Instrument: Curette Bleeding: None Procedural Pain: 0 Post Procedural Pain: 0 Response to Treatment: Procedure was tolerated well Level of Consciousness (Post- Awake and Alert procedure): Post Debridement Measurements of Total  Wound Length: (cm) 8.9 Width: (cm) 5.5 Depth: (cm) 0.3 Volume: (cm) 11.534 Character of Wound/Ulcer Post Debridement: Improved Post Procedure Diagnosis Same as Pre-procedure Electronic Signature(s) Signed: 04/08/2023 10:39:27 AM By: Duanne Guess MD FACS Signed: 04/08/2023 11:43:52 AM By: Zenaida Deed RN, BSN Entered By: Zenaida Deed on 04/08/2023 08:37:51 -------------------------------------------------------------------------------- HPI Details Patient Name: Date of Service: [name] 04/08/2023 8:00 A Barnie Del ([id number]) [id number] Page 3 of 17 Medical Record Number: [id number] Patient Account Number: [id number] Date of Birth/Sex: Treating RN: Mar 13, 1951 (72 y.o. M) Primary Care Provider: [name] Other Clinician: Referring Provider: Treating Provider/Extender: [name] in Treatment:  with dial antibacterial soap and water prior to dressing change. Peri-Wound Care: Sween Lotion (Moisturizing lotion) 1 x Per Week/30 Days Discharge Instructions: Apply moisturizing lotion as directed Prim Dressing: Maxorb Extra Ag+ Alginate Dressing, 2x2 (in/in) 1 x Per Week/30 Days ary Discharge Instructions: Apply to wound bed as instructed Secondary Dressing: Woven Gauze Sponge, Non-Sterile 4x4 in 1 x Per Week/30 Days Discharge Instructions: Apply over primary dressing as directed. Com pression Wrap: ThreePress (3 layer compression wrap) 1 x Per Week/30 Days Discharge Instructions: Apply three layer compression as directed. MATTISON, SEMENOV ([id number]) [id number] Page 15 of 17 04/08/2023: Since starting antibiotics last week, there has been tremendous improvement. The left dorsal foot wound is nearly closed with just a little bit of eschar on the surface. The right medial lower leg is less excoriated and red and he has had substantially less drainage. The amount of slough on the open areas of  his wounds has decreased markedly and there is no longer any odor. The back of his right lower leg that was newly open last week has dried up, but is not completely closed yet. I used a curette to debride eschar off of the left dorsal foot wound and slough from the large right medial leg wound. We will continue to apply topical gentamicin to the larger open areas of this wound. Silver alginate to the excoriated skin around the wound and to the back of his leg and to the dorsal foot. The Peel and Place wound VAC was applied. Continue bilateral 3 layer compression wraps. Follow-up in 1 week. Electronic Signature(s) Signed: 04/08/2023 9:10:07 AM By: Duanne Guess MD FACS Entered By: Duanne Guess on 04/08/2023 09:10:06 -------------------------------------------------------------------------------- HxROS Details Patient Name: Date of Service: Annice Needy, [name] 04/08/2023 8:00 A M Medical Record Number: [id number] Patient Account Number: [id number] Date of Birth/Sex: Treating RN: 05/09/1951 (72 y.o. M) Primary Care Provider: [name] Other Clinician: Referring Provider: Treating Provider/Extender: [name] in Treatment: 60 Information Obtained From Patient Chart Constitutional Symptoms (General Health) Medical History: Past Medical History Notes: obesity Eyes Medical History: Positive for: Cataracts - right eye removed Negative for: Glaucoma; Optic Neuritis Ear/Nose/Mouth/Throat Medical History: Negative for: Chronic sinus problems/congestion; Middle ear problems Past Medical History Notes: hard of hearing Hematologic/Lymphatic Medical History: Positive for: Anemia - macrocytic; Lymphedema Respiratory Medical History: Past Medical History Notes: frozen diaphragm right Cardiovascular Medical History: Positive for: Congestive Heart Failure; Deep Vein Thrombosis - left leg; Hypertension; Peripheral Venous Disease Past Medical History  Notes: hyperlipidemia Endocrine Medical History: Negative for: Type I Diabetes; Type II Diabetes MUJTABA, DYCK ([id number]) [id number] Page 16 of 17 Genitourinary Medical History: Negative for: End Stage Renal Disease Integumentary (Skin) Medical History: Negative for: History of Burn Musculoskeletal Medical History: Past Medical History Notes: myositis, lumbar DDD, spinal stenosis, cervical facet joint syndrome Oncologic Medical History: Negative for: Received Chemotherapy; Received Radiation Psychiatric Medical History: Negative for: Anorexia/bulimia; Confinement Anxiety HBO Extended History Items Eyes: Cataracts Immunizations Pneumococcal Vaccine: Received Pneumococcal Vaccination: Yes Received Pneumococcal Vaccination On or After 60th Birthday: Yes Implantable Devices No devices added Hospitalization / Surgery History Type of Hospitalization/Surgery cervical fusion lumbar surgery coronary stent placement lasik eye surgery hydrocele excision Family and Social History Cancer: Yes - Mother; Diabetes: No; Heart Disease: Yes - Father,Mother; Hereditary Spherocytosis: No; Hypertension: Yes - Father,Siblings; Kidney Disease: No; Lung Disease: Yes - Siblings; Seizures: No; Stroke: No; Thyroid Problems: No; Tuberculosis: No; Former smoker - quit 1991; Marital Status - Married; Alcohol Use: Moderate; Drug Use: No History;  compression as directed. Wound #6 - Lower Leg Wound Laterality: Right, Anterior Peri-Wound Care: Sween Lotion (Moisturizing lotion) 1 x Per Week/30 Days Discharge Instructions: Apply moisturizing lotion as directed Prim Dressing: peel and place vac ary 1 x Per Week/30 Days Compression Wrap: ThreePress (3 layer compression wrap) 1 x Per Week/30 Days Discharge Instructions:  Apply three layer compression as directed. Wound #8 - Foot Wound Laterality: Dorsal, Left Cleanser: Soap and Water 1 x Per Week/30 Days Discharge Instructions: May shower and wash wound with dial antibacterial soap and water prior to dressing change. Peri-Wound Care: Sween Lotion (Moisturizing lotion) 1 x Per Week/30 Days Discharge Instructions: Apply moisturizing lotion as directed Prim Dressing: Maxorb Extra Ag+ Alginate Dressing, 2x2 (in/in) 1 x Per Week/30 Days ary Discharge Instructions: Apply to wound bed as instructed Secondary Dressing: Woven Gauze Sponge, Non-Sterile 4x4 in 1 x Per Week/30 Days Discharge Instructions: Apply over primary dressing as directed. Compression Wrap: ThreePress (3 layer compression wrap) 1 x Per Week/30 Days Discharge Instructions: Apply three layer compression as directed. Wound #9 - Lower Leg Wound Laterality: Right, Lateral Cleanser: Soap and Water 1 x Per Week/30 Days Discharge Instructions: May shower and wash wound with dial antibacterial soap and water prior to dressing change. Peri-Wound Care: Sween Lotion (Moisturizing lotion) 1 x Per Week/30 Days Discharge Instructions: Apply moisturizing lotion as directed VINAL, NOSBISCH ([id number]) [id number] Page 8 of 17 Prim Dressing: Maxorb Extra Ag+ Alginate Dressing, 2x2 (in/in) 1 x Per Week/30 Days ary Discharge Instructions: Apply to wound bed as instructed Secondary Dressing: Woven Gauze Sponge, Non-Sterile 4x4 in 1 x Per Week/30 Days Discharge Instructions: Apply over primary dressing as directed. Compression Wrap: ThreePress (3 layer compression wrap) 1 x Per Week/30 Days Discharge Instructions: Apply three layer compression as directed. Electronic Signature(s) Signed: 04/08/2023 10:39:27 AM By: Duanne Guess MD FACS Entered By: Duanne Guess on 04/08/2023 09:08:06 -------------------------------------------------------------------------------- Problem List  Details Patient Name: Date of Service: [name] 04/08/2023 8:00 A M Medical Record Number: [id number] Patient Account Number: [id number] Date of Birth/Sex: Treating RN: 12-05-1950 (72 y.o. 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 Venous insufficiency (chronic) (peripheral) 02/09/2022 No Yes I89.0 Lymphedema, not elsewhere classified 02/09/2022 No Yes I10 Essential (primary) hypertension 02/09/2022 No Yes I25.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 angina pectoris 02/09/2022 No Yes Inactive Problems ICD-10 JAIRIUS, ARSENEAU ([id number]) [id number] Page 9 of 17 Code Description Active Date Inactive Date L97.512 Non-pressure chronic ulcer of other part of right foot with fat layer exposed 02/17/2022 02/17/2022 Resolved Problems ICD-10 Code Description Active Date Resolved Date L97.821 Non-pressure chronic ulcer of other part of left lower leg limited to breakdown of skin 03/11/2023 03/11/2023 L98.492 Non-pressure chronic ulcer of skin of other sites with fat layer exposed 03/11/2023 03/11/2023 Electronic Signature(s) Signed: 04/08/2023 8:39:12 AM By: Duanne Guess MD FACS Entered By: Duanne Guess on 04/08/2023 08:39:12 -------------------------------------------------------------------------------- Progress Note Details Patient Name: Date of Service: [name] 04/08/2023 8:00 A  M Medical Record Number: [id number] Patient Account Number: [id number] Date of Birth/Sex: Treating  Medical Record Number: [id number] Patient Account Number: [id number] Date of Birth/Sex: Treating RN: [birth year] (72 y.o. M) Primary Care Provider: [name] Other Clinician: Referring Provider: Treating Provider/Extender: [name] in Treatment: 60 Constitutional Hypertensive, asymptomatic. . . . no acute distress. Respiratory Normal work of breathing on room air. Notes 04/08/2023: Since starting antibiotics last week, there has been tremendous improvement. The left dorsal foot wound is nearly closed with just a little bit  of eschar on the surface. The right medial lower leg is less excoriated and red and he has had substantially less drainage. The amount of slough on the open areas of his wounds has decreased markedly and there is no longer any odor. The back of his right lower leg that was newly open last week has dried up, but is not completely closed yet. Electronic Signature(s) Signed: 04/08/2023 9:07:36 AM By: Duanne Guess MD FACS Previous Signature: 04/08/2023 8:59:03 AM Version By: Duanne Guess MD FACS Entered By: Duanne Guess on 04/08/2023 09:07:36 -------------------------------------------------------------------------------- Physician Orders Details Patient Name: Date of Service: [name] 04/08/2023 8:00 A M Medical Record Number: [id number] Patient Account Number: [id number] Date of Birth/Sex: Treating RN: March 11, 1951 (72 y.o. 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 - Dr. [name] RM 1 Return A Friday 9/20 @ 11:00 am Nurse Visit: - Tues. 9/17 @ 3:00  pm Anesthetic (In clinic) Topical Lidocaine 4% applied to wound bed Bathing/ Shower/ Hygiene May shower with protection but do not get wound dressing(s) wet. Protect dressing(s) with water repellant cover (for example, large plastic bag) or a cast cover and may then take shower. Negative Presssure Wound Therapy Wound Vac to wound continuously at 140mm/hg pressure Black Foam - peel and place Edema Control - Lymphedema / SCD / Other Lymphedema Pumps. Use Lymphedema pumps on leg(s) 2-3 times a day for 45-60 minutes. If wearing any wraps or hose, do not remove them. Continue exercising as instructed. Elevate legs to the level of the heart or above for 30 minutes daily and/or when sitting for 3-4 times a day throughout the day. - throughout the day Avoid standing for long periods of time. Exercise regularly Wound Treatment Wound #3 - Lower Leg Wound Laterality: Right, Medial Peri-Wound Care: Skin Prep 1 x Per Week/30 Days Discharge Instructions: cavelon to macerated wound margins Peri-Wound Care: Sween Lotion (Moisturizing lotion) 1 x Per Week/30 Days Discharge Instructions: Apply moisturizing lotion as directed Topical: Gentamicin 1 x Per Week/30 Days Discharge Instructions: in larger holes only Prim Dressing: Maxorb Extra Ag+ Alginate Dressing, 4x4.75 (in/in) 1 x Per Week/30 Days ary Discharge Instructions: Apply to excoriated weeping areas Prim Dressing: peel and place vac ary 1 x Per Week/30 Days Compression Wrap: ThreePress (3 layer compression wrap) 1 x Per Week/30 Days Discharge Instructions: Apply three layer  compression as directed. Wound #6 - Lower Leg Wound Laterality: Right, Anterior Peri-Wound Care: Sween Lotion (Moisturizing lotion) 1 x Per Week/30 Days Discharge Instructions: Apply moisturizing lotion as directed Prim Dressing: peel and place vac ary 1 x Per Week/30 Days Compression Wrap: ThreePress (3 layer compression wrap) 1 x Per Week/30 Days Discharge Instructions:  Apply three layer compression as directed. Wound #8 - Foot Wound Laterality: Dorsal, Left Cleanser: Soap and Water 1 x Per Week/30 Days Discharge Instructions: May shower and wash wound with dial antibacterial soap and water prior to dressing change. Peri-Wound Care: Sween Lotion (Moisturizing lotion) 1 x Per Week/30 Days Discharge Instructions: Apply moisturizing lotion as directed Prim Dressing: Maxorb Extra Ag+ Alginate Dressing, 2x2 (in/in) 1 x Per Week/30 Days ary Discharge Instructions: Apply to wound bed as instructed Secondary Dressing: Woven Gauze Sponge, Non-Sterile 4x4 in 1 x Per Week/30 Days Discharge Instructions: Apply over primary dressing as directed. Compression Wrap: ThreePress (3 layer compression wrap) 1 x Per Week/30 Days Discharge Instructions: Apply three layer compression as directed. Wound #9 - Lower Leg Wound Laterality: Right, Lateral Cleanser: Soap and Water 1 x Per Week/30 Days Discharge Instructions: May shower and wash wound with dial antibacterial soap and water prior to dressing change. Peri-Wound Care: Sween Lotion (Moisturizing lotion) 1 x Per Week/30 Days Discharge Instructions: Apply moisturizing lotion as directed VINAL, NOSBISCH ([id number]) [id number] Page 8 of 17 Prim Dressing: Maxorb Extra Ag+ Alginate Dressing, 2x2 (in/in) 1 x Per Week/30 Days ary Discharge Instructions: Apply to wound bed as instructed Secondary Dressing: Woven Gauze Sponge, Non-Sterile 4x4 in 1 x Per Week/30 Days Discharge Instructions: Apply over primary dressing as directed. Compression Wrap: ThreePress (3 layer compression wrap) 1 x Per Week/30 Days Discharge Instructions: Apply three layer compression as directed. Electronic Signature(s) Signed: 04/08/2023 10:39:27 AM By: Duanne Guess MD FACS Entered By: Duanne Guess on 04/08/2023 09:08:06 -------------------------------------------------------------------------------- Problem List  Details Patient Name: Date of Service: [name] 04/08/2023 8:00 A M Medical Record Number: [id number] Patient Account Number: [id number] Date of Birth/Sex: Treating RN: 12-05-1950 (72 y.o. 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 Venous insufficiency (chronic) (peripheral) 02/09/2022 No Yes I89.0 Lymphedema, not elsewhere classified 02/09/2022 No Yes I10 Essential (primary) hypertension 02/09/2022 No Yes I25.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 angina pectoris 02/09/2022 No Yes Inactive Problems ICD-10 JAIRIUS, ARSENEAU ([id number]) [id number] Page 9 of 17 Code Description Active Date Inactive Date L97.512 Non-pressure chronic ulcer of other part of right foot with fat layer exposed 02/17/2022 02/17/2022 Resolved Problems ICD-10 Code Description Active Date Resolved Date L97.821 Non-pressure chronic ulcer of other part of left lower leg limited to breakdown of skin 03/11/2023 03/11/2023 L98.492 Non-pressure chronic ulcer of skin of other sites with fat layer exposed 03/11/2023 03/11/2023 Electronic Signature(s) Signed: 04/08/2023 8:39:12 AM By: Duanne Guess MD FACS Entered By: Duanne Guess on 04/08/2023 08:39:12 -------------------------------------------------------------------------------- Progress Note Details Patient Name: Date of Service: [name] 04/08/2023 8:00 A  M Medical Record Number: [id number] Patient Account Number: [id number] Date of Birth/Sex: Treating  with dial antibacterial soap and water prior to dressing change. Peri-Wound Care: Sween Lotion (Moisturizing lotion) 1 x Per Week/30 Days Discharge Instructions: Apply moisturizing lotion as directed Prim Dressing: Maxorb Extra Ag+ Alginate Dressing, 2x2 (in/in) 1 x Per Week/30 Days ary Discharge Instructions: Apply to wound bed as instructed Secondary Dressing: Woven Gauze Sponge, Non-Sterile 4x4 in 1 x Per Week/30 Days Discharge Instructions: Apply over primary dressing as directed. Com pression Wrap: ThreePress (3 layer compression wrap) 1 x Per Week/30 Days Discharge Instructions: Apply three layer compression as directed. MATTISON, SEMENOV ([id number]) [id number] Page 15 of 17 04/08/2023: Since starting antibiotics last week, there has been tremendous improvement. The left dorsal foot wound is nearly closed with just a little bit of eschar on the surface. The right medial lower leg is less excoriated and red and he has had substantially less drainage. The amount of slough on the open areas of  his wounds has decreased markedly and there is no longer any odor. The back of his right lower leg that was newly open last week has dried up, but is not completely closed yet. I used a curette to debride eschar off of the left dorsal foot wound and slough from the large right medial leg wound. We will continue to apply topical gentamicin to the larger open areas of this wound. Silver alginate to the excoriated skin around the wound and to the back of his leg and to the dorsal foot. The Peel and Place wound VAC was applied. Continue bilateral 3 layer compression wraps. Follow-up in 1 week. Electronic Signature(s) Signed: 04/08/2023 9:10:07 AM By: Duanne Guess MD FACS Entered By: Duanne Guess on 04/08/2023 09:10:06 -------------------------------------------------------------------------------- HxROS Details Patient Name: Date of Service: Annice Needy, [name] 04/08/2023 8:00 A M Medical Record Number: [id number] Patient Account Number: [id number] Date of Birth/Sex: Treating RN: 05/09/1951 (72 y.o. M) Primary Care Provider: [name] Other Clinician: Referring Provider: Treating Provider/Extender: [name] in Treatment: 60 Information Obtained From Patient Chart Constitutional Symptoms (General Health) Medical History: Past Medical History Notes: obesity Eyes Medical History: Positive for: Cataracts - right eye removed Negative for: Glaucoma; Optic Neuritis Ear/Nose/Mouth/Throat Medical History: Negative for: Chronic sinus problems/congestion; Middle ear problems Past Medical History Notes: hard of hearing Hematologic/Lymphatic Medical History: Positive for: Anemia - macrocytic; Lymphedema Respiratory Medical History: Past Medical History Notes: frozen diaphragm right Cardiovascular Medical History: Positive for: Congestive Heart Failure; Deep Vein Thrombosis - left leg; Hypertension; Peripheral Venous Disease Past Medical History  Notes: hyperlipidemia Endocrine Medical History: Negative for: Type I Diabetes; Type II Diabetes MUJTABA, DYCK ([id number]) [id number] Page 16 of 17 Genitourinary Medical History: Negative for: End Stage Renal Disease Integumentary (Skin) Medical History: Negative for: History of Burn Musculoskeletal Medical History: Past Medical History Notes: myositis, lumbar DDD, spinal stenosis, cervical facet joint syndrome Oncologic Medical History: Negative for: Received Chemotherapy; Received Radiation Psychiatric Medical History: Negative for: Anorexia/bulimia; Confinement Anxiety HBO Extended History Items Eyes: Cataracts Immunizations Pneumococcal Vaccine: Received Pneumococcal Vaccination: Yes Received Pneumococcal Vaccination On or After 60th Birthday: Yes Implantable Devices No devices added Hospitalization / Surgery History Type of Hospitalization/Surgery cervical fusion lumbar surgery coronary stent placement lasik eye surgery hydrocele excision Family and Social History Cancer: Yes - Mother; Diabetes: No; Heart Disease: Yes - Father,Mother; Hereditary Spherocytosis: No; Hypertension: Yes - Father,Siblings; Kidney Disease: No; Lung Disease: Yes - Siblings; Seizures: No; Stroke: No; Thyroid Problems: No; Tuberculosis: No; Former smoker - quit 1991; Marital Status - Married; Alcohol Use: Moderate; Drug Use: No History;  with dial antibacterial soap and water prior to dressing change. Peri-Wound Care: Sween Lotion (Moisturizing lotion) 1 x Per Week/30 Days Discharge Instructions: Apply moisturizing lotion as directed Prim Dressing: Maxorb Extra Ag+ Alginate Dressing, 2x2 (in/in) 1 x Per Week/30 Days ary Discharge Instructions: Apply to wound bed as instructed Secondary Dressing: Woven Gauze Sponge, Non-Sterile 4x4 in 1 x Per Week/30 Days Discharge Instructions: Apply over primary dressing as directed. Com pression Wrap: ThreePress (3 layer compression wrap) 1 x Per Week/30 Days Discharge Instructions: Apply three layer compression as directed. MATTISON, SEMENOV ([id number]) [id number] Page 15 of 17 04/08/2023: Since starting antibiotics last week, there has been tremendous improvement. The left dorsal foot wound is nearly closed with just a little bit of eschar on the surface. The right medial lower leg is less excoriated and red and he has had substantially less drainage. The amount of slough on the open areas of  his wounds has decreased markedly and there is no longer any odor. The back of his right lower leg that was newly open last week has dried up, but is not completely closed yet. I used a curette to debride eschar off of the left dorsal foot wound and slough from the large right medial leg wound. We will continue to apply topical gentamicin to the larger open areas of this wound. Silver alginate to the excoriated skin around the wound and to the back of his leg and to the dorsal foot. The Peel and Place wound VAC was applied. Continue bilateral 3 layer compression wraps. Follow-up in 1 week. Electronic Signature(s) Signed: 04/08/2023 9:10:07 AM By: Duanne Guess MD FACS Entered By: Duanne Guess on 04/08/2023 09:10:06 -------------------------------------------------------------------------------- HxROS Details Patient Name: Date of Service: Annice Needy, [name] 04/08/2023 8:00 A M Medical Record Number: [id number] Patient Account Number: [id number] Date of Birth/Sex: Treating RN: 05/09/1951 (72 y.o. M) Primary Care Provider: [name] Other Clinician: Referring Provider: Treating Provider/Extender: [name] in Treatment: 60 Information Obtained From Patient Chart Constitutional Symptoms (General Health) Medical History: Past Medical History Notes: obesity Eyes Medical History: Positive for: Cataracts - right eye removed Negative for: Glaucoma; Optic Neuritis Ear/Nose/Mouth/Throat Medical History: Negative for: Chronic sinus problems/congestion; Middle ear problems Past Medical History Notes: hard of hearing Hematologic/Lymphatic Medical History: Positive for: Anemia - macrocytic; Lymphedema Respiratory Medical History: Past Medical History Notes: frozen diaphragm right Cardiovascular Medical History: Positive for: Congestive Heart Failure; Deep Vein Thrombosis - left leg; Hypertension; Peripheral Venous Disease Past Medical History  Notes: hyperlipidemia Endocrine Medical History: Negative for: Type I Diabetes; Type II Diabetes MUJTABA, DYCK ([id number]) [id number] Page 16 of 17 Genitourinary Medical History: Negative for: End Stage Renal Disease Integumentary (Skin) Medical History: Negative for: History of Burn Musculoskeletal Medical History: Past Medical History Notes: myositis, lumbar DDD, spinal stenosis, cervical facet joint syndrome Oncologic Medical History: Negative for: Received Chemotherapy; Received Radiation Psychiatric Medical History: Negative for: Anorexia/bulimia; Confinement Anxiety HBO Extended History Items Eyes: Cataracts Immunizations Pneumococcal Vaccine: Received Pneumococcal Vaccination: Yes Received Pneumococcal Vaccination On or After 60th Birthday: Yes Implantable Devices No devices added Hospitalization / Surgery History Type of Hospitalization/Surgery cervical fusion lumbar surgery coronary stent placement lasik eye surgery hydrocele excision Family and Social History Cancer: Yes - Mother; Diabetes: No; Heart Disease: Yes - Father,Mother; Hereditary Spherocytosis: No; Hypertension: Yes - Father,Siblings; Kidney Disease: No; Lung Disease: Yes - Siblings; Seizures: No; Stroke: No; Thyroid Problems: No; Tuberculosis: No; Former smoker - quit 1991; Marital Status - Married; Alcohol Use: Moderate; Drug Use: No History;  with dial antibacterial soap and water prior to dressing change. Peri-Wound Care: Sween Lotion (Moisturizing lotion) 1 x Per Week/30 Days Discharge Instructions: Apply moisturizing lotion as directed Prim Dressing: Maxorb Extra Ag+ Alginate Dressing, 2x2 (in/in) 1 x Per Week/30 Days ary Discharge Instructions: Apply to wound bed as instructed Secondary Dressing: Woven Gauze Sponge, Non-Sterile 4x4 in 1 x Per Week/30 Days Discharge Instructions: Apply over primary dressing as directed. Com pression Wrap: ThreePress (3 layer compression wrap) 1 x Per Week/30 Days Discharge Instructions: Apply three layer compression as directed. MATTISON, SEMENOV ([id number]) [id number] Page 15 of 17 04/08/2023: Since starting antibiotics last week, there has been tremendous improvement. The left dorsal foot wound is nearly closed with just a little bit of eschar on the surface. The right medial lower leg is less excoriated and red and he has had substantially less drainage. The amount of slough on the open areas of  his wounds has decreased markedly and there is no longer any odor. The back of his right lower leg that was newly open last week has dried up, but is not completely closed yet. I used a curette to debride eschar off of the left dorsal foot wound and slough from the large right medial leg wound. We will continue to apply topical gentamicin to the larger open areas of this wound. Silver alginate to the excoriated skin around the wound and to the back of his leg and to the dorsal foot. The Peel and Place wound VAC was applied. Continue bilateral 3 layer compression wraps. Follow-up in 1 week. Electronic Signature(s) Signed: 04/08/2023 9:10:07 AM By: Duanne Guess MD FACS Entered By: Duanne Guess on 04/08/2023 09:10:06 -------------------------------------------------------------------------------- HxROS Details Patient Name: Date of Service: Annice Needy, [name] 04/08/2023 8:00 A M Medical Record Number: [id number] Patient Account Number: [id number] Date of Birth/Sex: Treating RN: 05/09/1951 (72 y.o. M) Primary Care Provider: [name] Other Clinician: Referring Provider: Treating Provider/Extender: [name] in Treatment: 60 Information Obtained From Patient Chart Constitutional Symptoms (General Health) Medical History: Past Medical History Notes: obesity Eyes Medical History: Positive for: Cataracts - right eye removed Negative for: Glaucoma; Optic Neuritis Ear/Nose/Mouth/Throat Medical History: Negative for: Chronic sinus problems/congestion; Middle ear problems Past Medical History Notes: hard of hearing Hematologic/Lymphatic Medical History: Positive for: Anemia - macrocytic; Lymphedema Respiratory Medical History: Past Medical History Notes: frozen diaphragm right Cardiovascular Medical History: Positive for: Congestive Heart Failure; Deep Vein Thrombosis - left leg; Hypertension; Peripheral Venous Disease Past Medical History  Notes: hyperlipidemia Endocrine Medical History: Negative for: Type I Diabetes; Type II Diabetes MUJTABA, DYCK ([id number]) [id number] Page 16 of 17 Genitourinary Medical History: Negative for: End Stage Renal Disease Integumentary (Skin) Medical History: Negative for: History of Burn Musculoskeletal Medical History: Past Medical History Notes: myositis, lumbar DDD, spinal stenosis, cervical facet joint syndrome Oncologic Medical History: Negative for: Received Chemotherapy; Received Radiation Psychiatric Medical History: Negative for: Anorexia/bulimia; Confinement Anxiety HBO Extended History Items Eyes: Cataracts Immunizations Pneumococcal Vaccine: Received Pneumococcal Vaccination: Yes Received Pneumococcal Vaccination On or After 60th Birthday: Yes Implantable Devices No devices added Hospitalization / Surgery History Type of Hospitalization/Surgery cervical fusion lumbar surgery coronary stent placement lasik eye surgery hydrocele excision Family and Social History Cancer: Yes - Mother; Diabetes: No; Heart Disease: Yes - Father,Mother; Hereditary Spherocytosis: No; Hypertension: Yes - Father,Siblings; Kidney Disease: No; Lung Disease: Yes - Siblings; Seizures: No; Stroke: No; Thyroid Problems: No; Tuberculosis: No; Former smoker - quit 1991; Marital Status - Married; Alcohol Use: Moderate; Drug Use: No History;  DECORY, FRENI ([id number]) [id number] Page 1 of 17 Visit Report for 04/08/2023 Chief Complaint Document Details Patient Name: Date of Service: WYLLIAM, HANEK 04/08/2023 8:00 A M Medical Record Number: [id number] Patient Account Number: [id number] Date of Birth/Sex: Treating RN: [birth year] (72 y.o. M) Primary Care Provider: [name] Other Clinician: Referring Provider: Treating Provider/Extender: [name] in Treatment: 60 Information Obtained from: Patient Chief Complaint Patient seen for complaints of Non-Healing Wounds. Electronic Signature(s) Signed: 04/08/2023 8:54:43 AM By: Duanne Guess MD FACS Entered By: Duanne Guess on 04/08/2023 08:54:43 -------------------------------------------------------------------------------- Debridement Details Patient Name: Date of Service: [name] 04/08/2023 8:00 A M Medical Record Number: [id number] Patient Account Number: [id number] Date of Birth/Sex: Treating RN: November 15, 1950 (72 y.o. [name] Primary Care Provider: [name] Other Clinician: Referring Provider: Treating Provider/Extender: [name] in Treatment: 60 Debridement Performed for Assessment: Wound #8 Left,Dorsal Foot Performed By: Physician Duanne Guess, MD The following information was scribed by: Zenaida Deed The information was scribed for: Duanne Guess Debridement Type: Debridement Level of Consciousness (Pre-procedure): Awake and Alert Pre-procedure Verification/Time Out Yes - 08:35 Taken: Start Time: 08:35 Pain Control: Lidocaine 4% Topical Solution Percent of Wound Bed Debrided: 200% T Area Debrided (cm): otal 0.19 Tissue and other material debrided: Non-Viable, Eschar Level: Non-Viable Tissue Debridement Description: Selective/Open Wound Instrument: Curette Bleeding: None Procedural Pain: 0 Post Procedural Pain: 0 Response to Treatment:  Procedure was tolerated well Level of Consciousness (Post- Awake and Alert procedure): Post Debridement Measurements of Total Wound Length: (cm) 0.4 Width: (cm) 0.3 Depth: (cm) 0.1 Volume: (cm) 0.009 Character of Wound/Ulcer Post Debridement: Improved TRAYSHAUN, CLERE ([id number]) [id number] Page 2 of 17 Post Procedure Diagnosis Same as Pre-procedure Electronic Signature(s) Signed: 04/08/2023 10:39:27 AM By: Duanne Guess MD FACS Signed: 04/08/2023 11:43:52 AM By: Zenaida Deed RN, BSN Entered By: Zenaida Deed on 04/08/2023 08:36:13 -------------------------------------------------------------------------------- Debridement Details Patient Name: Date of Service: [name] 04/08/2023 8:00 A M Medical Record Number: [id number] Patient Account Number: [id number] Date of Birth/Sex: Treating RN: June 28, 1951 (72 y.o. [name] Primary Care Provider: [name] Other Clinician: Referring Provider: Treating Provider/Extender: [name] in Treatment: 60 Debridement Performed for Assessment: Wound #3 Right,Medial Lower Leg Performed By: Physician Duanne Guess, MD The following information was scribed by: Zenaida Deed The information was scribed for: Duanne Guess Debridement Type: Debridement Level of Consciousness (Pre-procedure): Awake and Alert Pre-procedure Verification/Time Out Yes - 08:35 Taken: Start Time: 08:35 Pain Control: Lidocaine 4% T opical Solution Percent of Wound Bed Debrided: 75% T Area Debrided (cm): otal 28.82 Tissue and other material debrided: Non-Viable, Slough, Slough Level: Non-Viable Tissue Debridement Description: Selective/Open Wound Instrument: Curette Bleeding: None Procedural Pain: 0 Post Procedural Pain: 0 Response to Treatment: Procedure was tolerated well Level of Consciousness (Post- Awake and Alert procedure): Post Debridement Measurements of Total  Wound Length: (cm) 8.9 Width: (cm) 5.5 Depth: (cm) 0.3 Volume: (cm) 11.534 Character of Wound/Ulcer Post Debridement: Improved Post Procedure Diagnosis Same as Pre-procedure Electronic Signature(s) Signed: 04/08/2023 10:39:27 AM By: Duanne Guess MD FACS Signed: 04/08/2023 11:43:52 AM By: Zenaida Deed RN, BSN Entered By: Zenaida Deed on 04/08/2023 08:37:51 -------------------------------------------------------------------------------- HPI Details Patient Name: Date of Service: [name] 04/08/2023 8:00 A Barnie Del ([id number]) [id number] Page 3 of 17 Medical Record Number: [id number] Patient Account Number: [id number] Date of Birth/Sex: Treating RN: Mar 13, 1951 (72 y.o. M) Primary Care Provider: [name] Other Clinician: Referring Provider: Treating Provider/Extender: [name] in Treatment:  Medical Record Number: [id number] Patient Account Number: [id number] Date of Birth/Sex: Treating RN: [birth year] (72 y.o. M) Primary Care Provider: [name] Other Clinician: Referring Provider: Treating Provider/Extender: [name] in Treatment: 60 Constitutional Hypertensive, asymptomatic. . . . no acute distress. Respiratory Normal work of breathing on room air. Notes 04/08/2023: Since starting antibiotics last week, there has been tremendous improvement. The left dorsal foot wound is nearly closed with just a little bit  of eschar on the surface. The right medial lower leg is less excoriated and red and he has had substantially less drainage. The amount of slough on the open areas of his wounds has decreased markedly and there is no longer any odor. The back of his right lower leg that was newly open last week has dried up, but is not completely closed yet. Electronic Signature(s) Signed: 04/08/2023 9:07:36 AM By: Duanne Guess MD FACS Previous Signature: 04/08/2023 8:59:03 AM Version By: Duanne Guess MD FACS Entered By: Duanne Guess on 04/08/2023 09:07:36 -------------------------------------------------------------------------------- Physician Orders Details Patient Name: Date of Service: [name] 04/08/2023 8:00 A M Medical Record Number: [id number] Patient Account Number: [id number] Date of Birth/Sex: Treating RN: March 11, 1951 (72 y.o. 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 - Dr. [name] RM 1 Return A Friday 9/20 @ 11:00 am Nurse Visit: - Tues. 9/17 @ 3:00  pm Anesthetic (In clinic) Topical Lidocaine 4% applied to wound bed Bathing/ Shower/ Hygiene May shower with protection but do not get wound dressing(s) wet. Protect dressing(s) with water repellant cover (for example, large plastic bag) or a cast cover and may then take shower. Negative Presssure Wound Therapy Wound Vac to wound continuously at 140mm/hg pressure Black Foam - peel and place Edema Control - Lymphedema / SCD / Other Lymphedema Pumps. Use Lymphedema pumps on leg(s) 2-3 times a day for 45-60 minutes. If wearing any wraps or hose, do not remove them. Continue exercising as instructed. Elevate legs to the level of the heart or above for 30 minutes daily and/or when sitting for 3-4 times a day throughout the day. - throughout the day Avoid standing for long periods of time. Exercise regularly Wound Treatment Wound #3 - Lower Leg Wound Laterality: Right, Medial Peri-Wound Care: Skin Prep 1 x Per Week/30 Days Discharge Instructions: cavelon to macerated wound margins Peri-Wound Care: Sween Lotion (Moisturizing lotion) 1 x Per Week/30 Days Discharge Instructions: Apply moisturizing lotion as directed Topical: Gentamicin 1 x Per Week/30 Days Discharge Instructions: in larger holes only Prim Dressing: Maxorb Extra Ag+ Alginate Dressing, 4x4.75 (in/in) 1 x Per Week/30 Days ary Discharge Instructions: Apply to excoriated weeping areas Prim Dressing: peel and place vac ary 1 x Per Week/30 Days Compression Wrap: ThreePress (3 layer compression wrap) 1 x Per Week/30 Days Discharge Instructions: Apply three layer  DECORY, FRENI ([id number]) [id number] Page 1 of 17 Visit Report for 04/08/2023 Chief Complaint Document Details Patient Name: Date of Service: WYLLIAM, HANEK 04/08/2023 8:00 A M Medical Record Number: [id number] Patient Account Number: [id number] Date of Birth/Sex: Treating RN: [birth year] (72 y.o. M) Primary Care Provider: [name] Other Clinician: Referring Provider: Treating Provider/Extender: [name] in Treatment: 60 Information Obtained from: Patient Chief Complaint Patient seen for complaints of Non-Healing Wounds. Electronic Signature(s) Signed: 04/08/2023 8:54:43 AM By: Duanne Guess MD FACS Entered By: Duanne Guess on 04/08/2023 08:54:43 -------------------------------------------------------------------------------- Debridement Details Patient Name: Date of Service: [name] 04/08/2023 8:00 A M Medical Record Number: [id number] Patient Account Number: [id number] Date of Birth/Sex: Treating RN: November 15, 1950 (72 y.o. [name] Primary Care Provider: [name] Other Clinician: Referring Provider: Treating Provider/Extender: [name] in Treatment: 60 Debridement Performed for Assessment: Wound #8 Left,Dorsal Foot Performed By: Physician Duanne Guess, MD The following information was scribed by: Zenaida Deed The information was scribed for: Duanne Guess Debridement Type: Debridement Level of Consciousness (Pre-procedure): Awake and Alert Pre-procedure Verification/Time Out Yes - 08:35 Taken: Start Time: 08:35 Pain Control: Lidocaine 4% Topical Solution Percent of Wound Bed Debrided: 200% T Area Debrided (cm): otal 0.19 Tissue and other material debrided: Non-Viable, Eschar Level: Non-Viable Tissue Debridement Description: Selective/Open Wound Instrument: Curette Bleeding: None Procedural Pain: 0 Post Procedural Pain: 0 Response to Treatment:  Procedure was tolerated well Level of Consciousness (Post- Awake and Alert procedure): Post Debridement Measurements of Total Wound Length: (cm) 0.4 Width: (cm) 0.3 Depth: (cm) 0.1 Volume: (cm) 0.009 Character of Wound/Ulcer Post Debridement: Improved TRAYSHAUN, CLERE ([id number]) [id number] Page 2 of 17 Post Procedure Diagnosis Same as Pre-procedure Electronic Signature(s) Signed: 04/08/2023 10:39:27 AM By: Duanne Guess MD FACS Signed: 04/08/2023 11:43:52 AM By: Zenaida Deed RN, BSN Entered By: Zenaida Deed on 04/08/2023 08:36:13 -------------------------------------------------------------------------------- Debridement Details Patient Name: Date of Service: [name] 04/08/2023 8:00 A M Medical Record Number: [id number] Patient Account Number: [id number] Date of Birth/Sex: Treating RN: June 28, 1951 (72 y.o. [name] Primary Care Provider: [name] Other Clinician: Referring Provider: Treating Provider/Extender: [name] in Treatment: 60 Debridement Performed for Assessment: Wound #3 Right,Medial Lower Leg Performed By: Physician Duanne Guess, MD The following information was scribed by: Zenaida Deed The information was scribed for: Duanne Guess Debridement Type: Debridement Level of Consciousness (Pre-procedure): Awake and Alert Pre-procedure Verification/Time Out Yes - 08:35 Taken: Start Time: 08:35 Pain Control: Lidocaine 4% T opical Solution Percent of Wound Bed Debrided: 75% T Area Debrided (cm): otal 28.82 Tissue and other material debrided: Non-Viable, Slough, Slough Level: Non-Viable Tissue Debridement Description: Selective/Open Wound Instrument: Curette Bleeding: None Procedural Pain: 0 Post Procedural Pain: 0 Response to Treatment: Procedure was tolerated well Level of Consciousness (Post- Awake and Alert procedure): Post Debridement Measurements of Total  Wound Length: (cm) 8.9 Width: (cm) 5.5 Depth: (cm) 0.3 Volume: (cm) 11.534 Character of Wound/Ulcer Post Debridement: Improved Post Procedure Diagnosis Same as Pre-procedure Electronic Signature(s) Signed: 04/08/2023 10:39:27 AM By: Duanne Guess MD FACS Signed: 04/08/2023 11:43:52 AM By: Zenaida Deed RN, BSN Entered By: Zenaida Deed on 04/08/2023 08:37:51 -------------------------------------------------------------------------------- HPI Details Patient Name: Date of Service: [name] 04/08/2023 8:00 A Barnie Del ([id number]) [id number] Page 3 of 17 Medical Record Number: [id number] Patient Account Number: [id number] Date of Birth/Sex: Treating RN: Mar 13, 1951 (72 y.o. M) Primary Care Provider: [name] Other Clinician: Referring Provider: Treating Provider/Extender: [name] in Treatment:  DECORY, FRENI ([id number]) [id number] Page 1 of 17 Visit Report for 04/08/2023 Chief Complaint Document Details Patient Name: Date of Service: WYLLIAM, HANEK 04/08/2023 8:00 A M Medical Record Number: [id number] Patient Account Number: [id number] Date of Birth/Sex: Treating RN: [birth year] (72 y.o. M) Primary Care Provider: [name] Other Clinician: Referring Provider: Treating Provider/Extender: [name] in Treatment: 60 Information Obtained from: Patient Chief Complaint Patient seen for complaints of Non-Healing Wounds. Electronic Signature(s) Signed: 04/08/2023 8:54:43 AM By: Duanne Guess MD FACS Entered By: Duanne Guess on 04/08/2023 08:54:43 -------------------------------------------------------------------------------- Debridement Details Patient Name: Date of Service: [name] 04/08/2023 8:00 A M Medical Record Number: [id number] Patient Account Number: [id number] Date of Birth/Sex: Treating RN: November 15, 1950 (72 y.o. [name] Primary Care Provider: [name] Other Clinician: Referring Provider: Treating Provider/Extender: [name] in Treatment: 60 Debridement Performed for Assessment: Wound #8 Left,Dorsal Foot Performed By: Physician Duanne Guess, MD The following information was scribed by: Zenaida Deed The information was scribed for: Duanne Guess Debridement Type: Debridement Level of Consciousness (Pre-procedure): Awake and Alert Pre-procedure Verification/Time Out Yes - 08:35 Taken: Start Time: 08:35 Pain Control: Lidocaine 4% Topical Solution Percent of Wound Bed Debrided: 200% T Area Debrided (cm): otal 0.19 Tissue and other material debrided: Non-Viable, Eschar Level: Non-Viable Tissue Debridement Description: Selective/Open Wound Instrument: Curette Bleeding: None Procedural Pain: 0 Post Procedural Pain: 0 Response to Treatment:  Procedure was tolerated well Level of Consciousness (Post- Awake and Alert procedure): Post Debridement Measurements of Total Wound Length: (cm) 0.4 Width: (cm) 0.3 Depth: (cm) 0.1 Volume: (cm) 0.009 Character of Wound/Ulcer Post Debridement: Improved TRAYSHAUN, CLERE ([id number]) [id number] Page 2 of 17 Post Procedure Diagnosis Same as Pre-procedure Electronic Signature(s) Signed: 04/08/2023 10:39:27 AM By: Duanne Guess MD FACS Signed: 04/08/2023 11:43:52 AM By: Zenaida Deed RN, BSN Entered By: Zenaida Deed on 04/08/2023 08:36:13 -------------------------------------------------------------------------------- Debridement Details Patient Name: Date of Service: [name] 04/08/2023 8:00 A M Medical Record Number: [id number] Patient Account Number: [id number] Date of Birth/Sex: Treating RN: June 28, 1951 (72 y.o. [name] Primary Care Provider: [name] Other Clinician: Referring Provider: Treating Provider/Extender: [name] in Treatment: 60 Debridement Performed for Assessment: Wound #3 Right,Medial Lower Leg Performed By: Physician Duanne Guess, MD The following information was scribed by: Zenaida Deed The information was scribed for: Duanne Guess Debridement Type: Debridement Level of Consciousness (Pre-procedure): Awake and Alert Pre-procedure Verification/Time Out Yes - 08:35 Taken: Start Time: 08:35 Pain Control: Lidocaine 4% T opical Solution Percent of Wound Bed Debrided: 75% T Area Debrided (cm): otal 28.82 Tissue and other material debrided: Non-Viable, Slough, Slough Level: Non-Viable Tissue Debridement Description: Selective/Open Wound Instrument: Curette Bleeding: None Procedural Pain: 0 Post Procedural Pain: 0 Response to Treatment: Procedure was tolerated well Level of Consciousness (Post- Awake and Alert procedure): Post Debridement Measurements of Total  Wound Length: (cm) 8.9 Width: (cm) 5.5 Depth: (cm) 0.3 Volume: (cm) 11.534 Character of Wound/Ulcer Post Debridement: Improved Post Procedure Diagnosis Same as Pre-procedure Electronic Signature(s) Signed: 04/08/2023 10:39:27 AM By: Duanne Guess MD FACS Signed: 04/08/2023 11:43:52 AM By: Zenaida Deed RN, BSN Entered By: Zenaida Deed on 04/08/2023 08:37:51 -------------------------------------------------------------------------------- HPI Details Patient Name: Date of Service: [name] 04/08/2023 8:00 A Barnie Del ([id number]) [id number] Page 3 of 17 Medical Record Number: [id number] Patient Account Number: [id number] Date of Birth/Sex: Treating RN: Mar 13, 1951 (72 y.o. M) Primary Care Provider: [name] Other Clinician: Referring Provider: Treating Provider/Extender: [name] in Treatment:  with dial antibacterial soap and water prior to dressing change. Peri-Wound Care: Sween Lotion (Moisturizing lotion) 1 x Per Week/30 Days Discharge Instructions: Apply moisturizing lotion as directed Prim Dressing: Maxorb Extra Ag+ Alginate Dressing, 2x2 (in/in) 1 x Per Week/30 Days ary Discharge Instructions: Apply to wound bed as instructed Secondary Dressing: Woven Gauze Sponge, Non-Sterile 4x4 in 1 x Per Week/30 Days Discharge Instructions: Apply over primary dressing as directed. Com pression Wrap: ThreePress (3 layer compression wrap) 1 x Per Week/30 Days Discharge Instructions: Apply three layer compression as directed. MATTISON, SEMENOV ([id number]) [id number] Page 15 of 17 04/08/2023: Since starting antibiotics last week, there has been tremendous improvement. The left dorsal foot wound is nearly closed with just a little bit of eschar on the surface. The right medial lower leg is less excoriated and red and he has had substantially less drainage. The amount of slough on the open areas of  his wounds has decreased markedly and there is no longer any odor. The back of his right lower leg that was newly open last week has dried up, but is not completely closed yet. I used a curette to debride eschar off of the left dorsal foot wound and slough from the large right medial leg wound. We will continue to apply topical gentamicin to the larger open areas of this wound. Silver alginate to the excoriated skin around the wound and to the back of his leg and to the dorsal foot. The Peel and Place wound VAC was applied. Continue bilateral 3 layer compression wraps. Follow-up in 1 week. Electronic Signature(s) Signed: 04/08/2023 9:10:07 AM By: Duanne Guess MD FACS Entered By: Duanne Guess on 04/08/2023 09:10:06 -------------------------------------------------------------------------------- HxROS Details Patient Name: Date of Service: Annice Needy, [name] 04/08/2023 8:00 A M Medical Record Number: [id number] Patient Account Number: [id number] Date of Birth/Sex: Treating RN: 05/09/1951 (72 y.o. M) Primary Care Provider: [name] Other Clinician: Referring Provider: Treating Provider/Extender: [name] in Treatment: 60 Information Obtained From Patient Chart Constitutional Symptoms (General Health) Medical History: Past Medical History Notes: obesity Eyes Medical History: Positive for: Cataracts - right eye removed Negative for: Glaucoma; Optic Neuritis Ear/Nose/Mouth/Throat Medical History: Negative for: Chronic sinus problems/congestion; Middle ear problems Past Medical History Notes: hard of hearing Hematologic/Lymphatic Medical History: Positive for: Anemia - macrocytic; Lymphedema Respiratory Medical History: Past Medical History Notes: frozen diaphragm right Cardiovascular Medical History: Positive for: Congestive Heart Failure; Deep Vein Thrombosis - left leg; Hypertension; Peripheral Venous Disease Past Medical History  Notes: hyperlipidemia Endocrine Medical History: Negative for: Type I Diabetes; Type II Diabetes MUJTABA, DYCK ([id number]) [id number] Page 16 of 17 Genitourinary Medical History: Negative for: End Stage Renal Disease Integumentary (Skin) Medical History: Negative for: History of Burn Musculoskeletal Medical History: Past Medical History Notes: myositis, lumbar DDD, spinal stenosis, cervical facet joint syndrome Oncologic Medical History: Negative for: Received Chemotherapy; Received Radiation Psychiatric Medical History: Negative for: Anorexia/bulimia; Confinement Anxiety HBO Extended History Items Eyes: Cataracts Immunizations Pneumococcal Vaccine: Received Pneumococcal Vaccination: Yes Received Pneumococcal Vaccination On or After 60th Birthday: Yes Implantable Devices No devices added Hospitalization / Surgery History Type of Hospitalization/Surgery cervical fusion lumbar surgery coronary stent placement lasik eye surgery hydrocele excision Family and Social History Cancer: Yes - Mother; Diabetes: No; Heart Disease: Yes - Father,Mother; Hereditary Spherocytosis: No; Hypertension: Yes - Father,Siblings; Kidney Disease: No; Lung Disease: Yes - Siblings; Seizures: No; Stroke: No; Thyroid Problems: No; Tuberculosis: No; Former smoker - quit 1991; Marital Status - Married; Alcohol Use: Moderate; Drug Use: No History;  compression as directed. Wound #6 - Lower Leg Wound Laterality: Right, Anterior Peri-Wound Care: Sween Lotion (Moisturizing lotion) 1 x Per Week/30 Days Discharge Instructions: Apply moisturizing lotion as directed Prim Dressing: peel and place vac ary 1 x Per Week/30 Days Compression Wrap: ThreePress (3 layer compression wrap) 1 x Per Week/30 Days Discharge Instructions:  Apply three layer compression as directed. Wound #8 - Foot Wound Laterality: Dorsal, Left Cleanser: Soap and Water 1 x Per Week/30 Days Discharge Instructions: May shower and wash wound with dial antibacterial soap and water prior to dressing change. Peri-Wound Care: Sween Lotion (Moisturizing lotion) 1 x Per Week/30 Days Discharge Instructions: Apply moisturizing lotion as directed Prim Dressing: Maxorb Extra Ag+ Alginate Dressing, 2x2 (in/in) 1 x Per Week/30 Days ary Discharge Instructions: Apply to wound bed as instructed Secondary Dressing: Woven Gauze Sponge, Non-Sterile 4x4 in 1 x Per Week/30 Days Discharge Instructions: Apply over primary dressing as directed. Compression Wrap: ThreePress (3 layer compression wrap) 1 x Per Week/30 Days Discharge Instructions: Apply three layer compression as directed. Wound #9 - Lower Leg Wound Laterality: Right, Lateral Cleanser: Soap and Water 1 x Per Week/30 Days Discharge Instructions: May shower and wash wound with dial antibacterial soap and water prior to dressing change. Peri-Wound Care: Sween Lotion (Moisturizing lotion) 1 x Per Week/30 Days Discharge Instructions: Apply moisturizing lotion as directed VINAL, NOSBISCH ([id number]) [id number] Page 8 of 17 Prim Dressing: Maxorb Extra Ag+ Alginate Dressing, 2x2 (in/in) 1 x Per Week/30 Days ary Discharge Instructions: Apply to wound bed as instructed Secondary Dressing: Woven Gauze Sponge, Non-Sterile 4x4 in 1 x Per Week/30 Days Discharge Instructions: Apply over primary dressing as directed. Compression Wrap: ThreePress (3 layer compression wrap) 1 x Per Week/30 Days Discharge Instructions: Apply three layer compression as directed. Electronic Signature(s) Signed: 04/08/2023 10:39:27 AM By: Duanne Guess MD FACS Entered By: Duanne Guess on 04/08/2023 09:08:06 -------------------------------------------------------------------------------- Problem List  Details Patient Name: Date of Service: [name] 04/08/2023 8:00 A M Medical Record Number: [id number] Patient Account Number: [id number] Date of Birth/Sex: Treating RN: 12-05-1950 (72 y.o. 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 Venous insufficiency (chronic) (peripheral) 02/09/2022 No Yes I89.0 Lymphedema, not elsewhere classified 02/09/2022 No Yes I10 Essential (primary) hypertension 02/09/2022 No Yes I25.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 angina pectoris 02/09/2022 No Yes Inactive Problems ICD-10 JAIRIUS, ARSENEAU ([id number]) [id number] Page 9 of 17 Code Description Active Date Inactive Date L97.512 Non-pressure chronic ulcer of other part of right foot with fat layer exposed 02/17/2022 02/17/2022 Resolved Problems ICD-10 Code Description Active Date Resolved Date L97.821 Non-pressure chronic ulcer of other part of left lower leg limited to breakdown of skin 03/11/2023 03/11/2023 L98.492 Non-pressure chronic ulcer of skin of other sites with fat layer exposed 03/11/2023 03/11/2023 Electronic Signature(s) Signed: 04/08/2023 8:39:12 AM By: Duanne Guess MD FACS Entered By: Duanne Guess on 04/08/2023 08:39:12 -------------------------------------------------------------------------------- Progress Note Details Patient Name: Date of Service: [name] 04/08/2023 8:00 A  M Medical Record Number: [id number] Patient Account Number: [id number] Date of Birth/Sex: Treating  Medical Record Number: [id number] Patient Account Number: [id number] Date of Birth/Sex: Treating RN: [birth year] (72 y.o. M) Primary Care Provider: [name] Other Clinician: Referring Provider: Treating Provider/Extender: [name] in Treatment: 60 Constitutional Hypertensive, asymptomatic. . . . no acute distress. Respiratory Normal work of breathing on room air. Notes 04/08/2023: Since starting antibiotics last week, there has been tremendous improvement. The left dorsal foot wound is nearly closed with just a little bit  of eschar on the surface. The right medial lower leg is less excoriated and red and he has had substantially less drainage. The amount of slough on the open areas of his wounds has decreased markedly and there is no longer any odor. The back of his right lower leg that was newly open last week has dried up, but is not completely closed yet. Electronic Signature(s) Signed: 04/08/2023 9:07:36 AM By: Duanne Guess MD FACS Previous Signature: 04/08/2023 8:59:03 AM Version By: Duanne Guess MD FACS Entered By: Duanne Guess on 04/08/2023 09:07:36 -------------------------------------------------------------------------------- Physician Orders Details Patient Name: Date of Service: [name] 04/08/2023 8:00 A M Medical Record Number: [id number] Patient Account Number: [id number] Date of Birth/Sex: Treating RN: March 11, 1951 (72 y.o. 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 - Dr. [name] RM 1 Return A Friday 9/20 @ 11:00 am Nurse Visit: - Tues. 9/17 @ 3:00  pm Anesthetic (In clinic) Topical Lidocaine 4% applied to wound bed Bathing/ Shower/ Hygiene May shower with protection but do not get wound dressing(s) wet. Protect dressing(s) with water repellant cover (for example, large plastic bag) or a cast cover and may then take shower. Negative Presssure Wound Therapy Wound Vac to wound continuously at 140mm/hg pressure Black Foam - peel and place Edema Control - Lymphedema / SCD / Other Lymphedema Pumps. Use Lymphedema pumps on leg(s) 2-3 times a day for 45-60 minutes. If wearing any wraps or hose, do not remove them. Continue exercising as instructed. Elevate legs to the level of the heart or above for 30 minutes daily and/or when sitting for 3-4 times a day throughout the day. - throughout the day Avoid standing for long periods of time. Exercise regularly Wound Treatment Wound #3 - Lower Leg Wound Laterality: Right, Medial Peri-Wound Care: Skin Prep 1 x Per Week/30 Days Discharge Instructions: cavelon to macerated wound margins Peri-Wound Care: Sween Lotion (Moisturizing lotion) 1 x Per Week/30 Days Discharge Instructions: Apply moisturizing lotion as directed Topical: Gentamicin 1 x Per Week/30 Days Discharge Instructions: in larger holes only Prim Dressing: Maxorb Extra Ag+ Alginate Dressing, 4x4.75 (in/in) 1 x Per Week/30 Days ary Discharge Instructions: Apply to excoriated weeping areas Prim Dressing: peel and place vac ary 1 x Per Week/30 Days Compression Wrap: ThreePress (3 layer compression wrap) 1 x Per Week/30 Days Discharge Instructions: Apply three layer  with dial antibacterial soap and water prior to dressing change. Peri-Wound Care: Sween Lotion (Moisturizing lotion) 1 x Per Week/30 Days Discharge Instructions: Apply moisturizing lotion as directed Prim Dressing: Maxorb Extra Ag+ Alginate Dressing, 2x2 (in/in) 1 x Per Week/30 Days ary Discharge Instructions: Apply to wound bed as instructed Secondary Dressing: Woven Gauze Sponge, Non-Sterile 4x4 in 1 x Per Week/30 Days Discharge Instructions: Apply over primary dressing as directed. Com pression Wrap: ThreePress (3 layer compression wrap) 1 x Per Week/30 Days Discharge Instructions: Apply three layer compression as directed. MATTISON, SEMENOV ([id number]) [id number] Page 15 of 17 04/08/2023: Since starting antibiotics last week, there has been tremendous improvement. The left dorsal foot wound is nearly closed with just a little bit of eschar on the surface. The right medial lower leg is less excoriated and red and he has had substantially less drainage. The amount of slough on the open areas of  his wounds has decreased markedly and there is no longer any odor. The back of his right lower leg that was newly open last week has dried up, but is not completely closed yet. I used a curette to debride eschar off of the left dorsal foot wound and slough from the large right medial leg wound. We will continue to apply topical gentamicin to the larger open areas of this wound. Silver alginate to the excoriated skin around the wound and to the back of his leg and to the dorsal foot. The Peel and Place wound VAC was applied. Continue bilateral 3 layer compression wraps. Follow-up in 1 week. Electronic Signature(s) Signed: 04/08/2023 9:10:07 AM By: Duanne Guess MD FACS Entered By: Duanne Guess on 04/08/2023 09:10:06 -------------------------------------------------------------------------------- HxROS Details Patient Name: Date of Service: Annice Needy, [name] 04/08/2023 8:00 A M Medical Record Number: [id number] Patient Account Number: [id number] Date of Birth/Sex: Treating RN: 05/09/1951 (72 y.o. M) Primary Care Provider: [name] Other Clinician: Referring Provider: Treating Provider/Extender: [name] in Treatment: 60 Information Obtained From Patient Chart Constitutional Symptoms (General Health) Medical History: Past Medical History Notes: obesity Eyes Medical History: Positive for: Cataracts - right eye removed Negative for: Glaucoma; Optic Neuritis Ear/Nose/Mouth/Throat Medical History: Negative for: Chronic sinus problems/congestion; Middle ear problems Past Medical History Notes: hard of hearing Hematologic/Lymphatic Medical History: Positive for: Anemia - macrocytic; Lymphedema Respiratory Medical History: Past Medical History Notes: frozen diaphragm right Cardiovascular Medical History: Positive for: Congestive Heart Failure; Deep Vein Thrombosis - left leg; Hypertension; Peripheral Venous Disease Past Medical History  Notes: hyperlipidemia Endocrine Medical History: Negative for: Type I Diabetes; Type II Diabetes MUJTABA, DYCK ([id number]) [id number] Page 16 of 17 Genitourinary Medical History: Negative for: End Stage Renal Disease Integumentary (Skin) Medical History: Negative for: History of Burn Musculoskeletal Medical History: Past Medical History Notes: myositis, lumbar DDD, spinal stenosis, cervical facet joint syndrome Oncologic Medical History: Negative for: Received Chemotherapy; Received Radiation Psychiatric Medical History: Negative for: Anorexia/bulimia; Confinement Anxiety HBO Extended History Items Eyes: Cataracts Immunizations Pneumococcal Vaccine: Received Pneumococcal Vaccination: Yes Received Pneumococcal Vaccination On or After 60th Birthday: Yes Implantable Devices No devices added Hospitalization / Surgery History Type of Hospitalization/Surgery cervical fusion lumbar surgery coronary stent placement lasik eye surgery hydrocele excision Family and Social History Cancer: Yes - Mother; Diabetes: No; Heart Disease: Yes - Father,Mother; Hereditary Spherocytosis: No; Hypertension: Yes - Father,Siblings; Kidney Disease: No; Lung Disease: Yes - Siblings; Seizures: No; Stroke: No; Thyroid Problems: No; Tuberculosis: No; Former smoker - quit 1991; Marital Status - Married; Alcohol Use: Moderate; Drug Use: No History;  with dial antibacterial soap and water prior to dressing change. Peri-Wound Care: Sween Lotion (Moisturizing lotion) 1 x Per Week/30 Days Discharge Instructions: Apply moisturizing lotion as directed Prim Dressing: Maxorb Extra Ag+ Alginate Dressing, 2x2 (in/in) 1 x Per Week/30 Days ary Discharge Instructions: Apply to wound bed as instructed Secondary Dressing: Woven Gauze Sponge, Non-Sterile 4x4 in 1 x Per Week/30 Days Discharge Instructions: Apply over primary dressing as directed. Com pression Wrap: ThreePress (3 layer compression wrap) 1 x Per Week/30 Days Discharge Instructions: Apply three layer compression as directed. MATTISON, SEMENOV ([id number]) [id number] Page 15 of 17 04/08/2023: Since starting antibiotics last week, there has been tremendous improvement. The left dorsal foot wound is nearly closed with just a little bit of eschar on the surface. The right medial lower leg is less excoriated and red and he has had substantially less drainage. The amount of slough on the open areas of  his wounds has decreased markedly and there is no longer any odor. The back of his right lower leg that was newly open last week has dried up, but is not completely closed yet. I used a curette to debride eschar off of the left dorsal foot wound and slough from the large right medial leg wound. We will continue to apply topical gentamicin to the larger open areas of this wound. Silver alginate to the excoriated skin around the wound and to the back of his leg and to the dorsal foot. The Peel and Place wound VAC was applied. Continue bilateral 3 layer compression wraps. Follow-up in 1 week. Electronic Signature(s) Signed: 04/08/2023 9:10:07 AM By: Duanne Guess MD FACS Entered By: Duanne Guess on 04/08/2023 09:10:06 -------------------------------------------------------------------------------- HxROS Details Patient Name: Date of Service: Annice Needy, [name] 04/08/2023 8:00 A M Medical Record Number: [id number] Patient Account Number: [id number] Date of Birth/Sex: Treating RN: 05/09/1951 (72 y.o. M) Primary Care Provider: [name] Other Clinician: Referring Provider: Treating Provider/Extender: [name] in Treatment: 60 Information Obtained From Patient Chart Constitutional Symptoms (General Health) Medical History: Past Medical History Notes: obesity Eyes Medical History: Positive for: Cataracts - right eye removed Negative for: Glaucoma; Optic Neuritis Ear/Nose/Mouth/Throat Medical History: Negative for: Chronic sinus problems/congestion; Middle ear problems Past Medical History Notes: hard of hearing Hematologic/Lymphatic Medical History: Positive for: Anemia - macrocytic; Lymphedema Respiratory Medical History: Past Medical History Notes: frozen diaphragm right Cardiovascular Medical History: Positive for: Congestive Heart Failure; Deep Vein Thrombosis - left leg; Hypertension; Peripheral Venous Disease Past Medical History  Notes: hyperlipidemia Endocrine Medical History: Negative for: Type I Diabetes; Type II Diabetes MUJTABA, DYCK ([id number]) [id number] Page 16 of 17 Genitourinary Medical History: Negative for: End Stage Renal Disease Integumentary (Skin) Medical History: Negative for: History of Burn Musculoskeletal Medical History: Past Medical History Notes: myositis, lumbar DDD, spinal stenosis, cervical facet joint syndrome Oncologic Medical History: Negative for: Received Chemotherapy; Received Radiation Psychiatric Medical History: Negative for: Anorexia/bulimia; Confinement Anxiety HBO Extended History Items Eyes: Cataracts Immunizations Pneumococcal Vaccine: Received Pneumococcal Vaccination: Yes Received Pneumococcal Vaccination On or After 60th Birthday: Yes Implantable Devices No devices added Hospitalization / Surgery History Type of Hospitalization/Surgery cervical fusion lumbar surgery coronary stent placement lasik eye surgery hydrocele excision Family and Social History Cancer: Yes - Mother; Diabetes: No; Heart Disease: Yes - Father,Mother; Hereditary Spherocytosis: No; Hypertension: Yes - Father,Siblings; Kidney Disease: No; Lung Disease: Yes - Siblings; Seizures: No; Stroke: No; Thyroid Problems: No; Tuberculosis: No; Former smoker - quit 1991; Marital Status - Married; Alcohol Use: Moderate; Drug Use: No History;

## 2023-04-12 ENCOUNTER — Encounter (HOSPITAL_BASED_OUTPATIENT_CLINIC_OR_DEPARTMENT_OTHER): Payer: Medicare Other | Admitting: General Surgery

## 2023-04-12 DIAGNOSIS — I872 Venous insufficiency (chronic) (peripheral): Secondary | ICD-10-CM | POA: Diagnosis not present

## 2023-04-12 DIAGNOSIS — L942 Calcinosis cutis: Secondary | ICD-10-CM | POA: Diagnosis not present

## 2023-04-12 DIAGNOSIS — L97521 Non-pressure chronic ulcer of other part of left foot limited to breakdown of skin: Secondary | ICD-10-CM | POA: Diagnosis not present

## 2023-04-12 DIAGNOSIS — M609 Myositis, unspecified: Secondary | ICD-10-CM | POA: Diagnosis not present

## 2023-04-12 DIAGNOSIS — I890 Lymphedema, not elsewhere classified: Secondary | ICD-10-CM | POA: Diagnosis not present

## 2023-04-12 DIAGNOSIS — L97812 Non-pressure chronic ulcer of other part of right lower leg with fat layer exposed: Secondary | ICD-10-CM | POA: Diagnosis not present

## 2023-04-12 NOTE — Progress Notes (Signed)
ZAKKAI, ZAPPA ([id number]) [id number] Page 1 of 1 Visit Report for 04/12/2023 SuperBill Details Patient Name: Date of Service: NYCERE, RENY 04/12/2023 Medical Record Number: [id number] Patient Account Number: [id number] Date of Birth/Sex: Treating RN: September 15, 1950 (72 y.o. 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) (peripheral) I89.0 Lymphedema, not elsewhere classified I10 Essential (primary) hypertension I25.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 angina pectoris Facility Procedures CPT4 Code Description Modifier Quantity [phone] - WOUND VAC-50 SQ CM OR LESS 1 ICD-10 Diagnosis Description 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ed [id number] (Facility Use Only) [id number] - APPLY MULTLAY COMPRS LWR RT LEG 1 ICD-10 Diagnosis Description 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ed Physician Procedures Quantity CPT4 Code Description Modifier [id number] - WC PHYS TX WOUND VAC < 50 SQ CM 1 ICD-10 Diagnosis Description 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ed Electronic Signature(s) Signed: 04/12/2023 3:33:31 PM By: [name] Signed: 04/12/2023 3:57:57 PM By: Duanne Guess MD FACS Entered By: [name] on 04/12/2023 12:33:08

## 2023-04-12 NOTE — Progress Notes (Signed)
DAESON, [name] ([id number]) [id number] Page 1 of 8 Visit Report for 04/12/2023 Arrival Information Details Patient Name: Date of Service: [name], [name] 04/12/2023 3:00 PM Medical Record Number: [id number] Patient Account Number: [id number] Date of Birth/Sex: Treating RN: Jul 13, 1951 (72 y.o. [name] Primary Care Katryn Plummer: [name] Other Clinician: Referring Prateek Knipple: Treating Braeson Rupe/Extender: [name] in Treatment: 89 Visit Information History Since Last Visit Added or deleted any medications: No Patient Arrived: Dan Humphreys Any new allergies or adverse reactions: No Arrival Time: 15:29 Had a fall or experienced change in No Accompanied By: self activities of daily living that may affect Transfer Assistance: None risk of falls: Patient Identification Verified: Yes Signs or symptoms of abuse/neglect since last visito No Secondary Verification Process Completed: Yes Hospitalized since last visit: No Patient Requires Transmission-Based Precautions: No Implantable device outside of the clinic excluding No Patient Has Alerts: Yes cellular tissue based products placed in the center Patient Alerts: R ABI= N/C, TBI= .75 since last visit: L ABI =N/C, TBI = .57 Has Dressing in Place as Prescribed: Yes Has Compression in Place as Prescribed: Yes Pain Present Now: No Electronic Signature(s) Signed: 04/12/2023 3:33:31 PM By: [name] Entered By: [name] on 04/12/2023 12:30:13 -------------------------------------------------------------------------------- Compression Therapy Details Patient Name: Date of Service: [name] 04/12/2023 3:00 PM Medical Record Number: [id number] Patient Account Number: [id number] Date of Birth/Sex: Treating RN: 09/21/1950 (72 y.o. [name] Primary Care Asuncion Shibata: [name] Other Clinician: Referring Izek Corvino: Treating Ekaterini Capitano/Extender: [name] in Treatment: 61 Compression Therapy Performed for Wound Assessment: Wound #3 Right,Medial Lower Leg Performed By: Clinician [name], RN Compression Type: Three Layer Electronic Signature(s) Signed: 04/12/2023 3:33:31 PM By: [name] Entered By: [name] on 04/12/2023 12:32:27 -------------------------------------------------------------------------------- Encounter Discharge Information Details Patient Name: Date of Service: [name] 04/12/2023 3:00 PM Medical Record Number: [id number] Patient Account Number: [id number] KEEYON, KUDRICK ([id number]) [id number] Page 2 of 8 Date of Birth/Sex: Treating RN: Sep 01, 1950 (72 y.o. [name] Primary Care Dianne Bady: Other Clinician: [name] Referring Mukhtar Shams: Treating Reznor Ferrando/Extender: [name] in Treatment: 13 Encounter Discharge Information Items Discharge Condition: Stable Ambulatory Status: Walker Discharge Destination: Home Transportation: Private Auto Accompanied By: self Schedule Follow-up Appointment: Yes Clinical Summary of Care: Patient Declined Electronic Signature(s) Signed: 04/12/2023 3:33:31 PM By: [name] Entered By: [name] on 04/12/2023 12:32:55 -------------------------------------------------------------------------------- Negative Pressure Wound Therapy Maintenance (NPWT) Details Patient Name: Date of Service: [name] 04/12/2023 3:00 PM Medical Record Number: [id number] Patient Account Number: [id number] Date of Birth/Sex: Treating RN: 1951-06-27 (72 y.o. [name] Primary Care Johnelle Tafolla: [name] Other Clinician: Referring Tiaria Biby: Treating Oneka Parada/Extender: [name] in Treatment: 61 NPWT Maintenance Performed for: Wound #3 Right, Medial Lower Leg Additional Injuries Covered: Yes Additional Injuries:  Wound #6 Right, Anterior Lower Leg Performed By: [name], RN Type: VAC System Coverage Size (sq cm): 49.91 Pressure Type: Constant Pressure Setting: 125 mmHG Drain Type: None Primary Contact: Other : Sponge/Dressing Type: Foam- Black Date Initiated: 03/11/2023 Dressing Removed: Yes Quantity of Sponges/Gauze Removed: 1 Canister Changed: No Dressing Reapplied: Yes Quantity of Sponges/Gauze Inserted: 1 Days On NPWT : 33 Electronic Signature(s) Signed: 04/12/2023 3:33:31 PM By: [name] Entered By: [name] on 04/12/2023 12:32:08 -------------------------------------------------------------------------------- Patient/Caregiver Education Details Patient Name: Date of Service: [name] 9/17/2024andnbsp3:00 PM Medical Record Number: [id number] Patient Account Number: [id number] Date of Birth/Gender: Treating RN: 111/16/1952 (72 y.o. [name] Primary Care Physician: Milinda Cave,  Loistine Chance Other Clinician: Referring Physician: Treating Physician/Extender: [name] in Treatment: [address] [place] ([id number]) [id number] Page 3 of 8 Education Assessment Education Provided To: Patient Education Topics Provided Wound/Skin Impairment: Methods: Explain/Verbal Responses: Reinforcements needed, State content correctly Electronic Signature(s) Signed: 04/12/2023 3:33:31 PM By: [name] Entered By: [name] on 04/12/2023 12:32:43 -------------------------------------------------------------------------------- Wound Assessment Details Patient Name: Date of Service: [name] 04/12/2023 3:00 PM Medical Record Number: [id number] Patient Account Number: [id number] Date of Birth/Sex: Treating RN: Jul 08, 1951 (72 y.o. [name] Primary Care Sabra Sessler: [name] Other Clinician: Referring Euva Rundell: Treating Timoth Schara/Extender: [name]  in Treatment: 61 Wound Status Wound Number: 3 Primary Lymphedema Etiology: Wound Location: Right, Medial Lower Leg Wound Open Wounding Event: Gradually Appeared Status: Date Acquired: 12/07/2021 Comorbid Cataracts, Anemia, Lymphedema, Congestive Heart Failure, Deep Weeks Of Treatment: 61 History: Vein Thrombosis, Hypertension, Peripheral Venous Disease Clustered Wound: No Wound Measurements Length: (cm) 8.9 Width: (cm) 5.5 Depth: (cm) 0.3 Area: (cm) 38.445 Volume: (cm) 11.534 % Reduction in Area: -619.8% % Reduction in Volume: -440% Epithelialization: Small (1-33%) Wound Description Classification: Full Thickness With Exposed Support Structures Wound Margin: Indistinct, nonvisible Exudate Amount: Large Exudate Type: Serous Exudate Color: amber Foul Odor After Cleansing: No Slough/Fibrino Yes Wound Bed Granulation Amount: Small (1-33%) Exposed Structure Granulation Quality: Red Fascia Exposed: No Necrotic Amount: Large (67-100%) Fat Layer (Subcutaneous Tissue) Exposed: Yes Necrotic Quality: Adherent Slough Tendon Exposed: No Muscle Exposed: No Joint Exposed: No Bone Exposed: No Periwound Skin Texture Texture Color No Abnormalities Noted: No No Abnormalities Noted: No Excoriation: No Erythema: Yes Erythema Location: Circumferential Moisture Erythema Change: Decreased No Abnormalities Noted: No Maceration: Yes Temperature / Pain Temperature: No Abnormality Contino, Tilak ([id number]) [id number] Page 4 of 8 Treatment Notes Wound #3 (Lower Leg) Wound Laterality: Right, Medial Cleanser Peri-Wound Care Skin Prep Discharge Instruction: cavelon to macerated wound margins Sween Lotion (Moisturizing lotion) Discharge Instruction: Apply moisturizing lotion as directed Topical Gentamicin Discharge Instruction: in larger holes only Primary Dressing Maxorb Extra Ag+ Alginate Dressing, 4x4.75 (in/in) Discharge Instruction: Apply to excoriated  weeping areas peel and place vac Secondary Dressing Secured With Compression Wrap ThreePress (3 layer compression wrap) Discharge Instruction: Apply three layer compression as directed. Compression Stockings Add-Ons Electronic Signature(s) Signed: 04/12/2023 3:33:31 PM By: [name] Entered By: [name] on 04/12/2023 12:30:54 -------------------------------------------------------------------------------- Wound Assessment Details Patient Name: Date of Service: [name], SUPPES 04/12/2023 3:00 PM Medical Record Number: [id number] Patient Account Number: [id number] Date of Birth/Sex: Treating RN: Jul 27, 1950 (72 y.o. [name] Primary Care Saanvi Hakala: [name] Other Clinician: Referring Kylo Gavin: Treating Alanya Vukelich/Extender: [name] in Treatment: 61 Wound Status Wound Number: 6 Primary Calcinosis Etiology: Wound Location: Right, Anterior Lower Leg Wound Open Wounding Event: Gradually Appeared Status: Date Acquired: 05/12/2022 Comorbid Cataracts, Anemia, Lymphedema, Congestive Heart Failure, Deep Weeks Of Treatment: 47 History: Vein Thrombosis, Hypertension, Peripheral Venous Disease Clustered Wound: Yes Wound Measurements Length: (cm) Width: (cm) Depth: (cm) Clustered Quantity: Area: (cm) Volume: (cm) 2.4 % Reduction in Area: -219.5% 0.4 % Reduction in Volume: -2100% 0.7 Epithelialization: Large (67-100%) 3 0.754 0.528 Wound Description Classification: Full Thickness Without Exposed Support Structures Wound Margin: Epibole Exudate Amount: Medium Droessler, Akshath ([id number]) Exudate Type: Serous Exudate Color: amber Foul Odor After Cleansing: No Slough/Fibrino Yes [id number] Page 5 of 8 Wound Bed Granulation Amount: Small (1-33%) Exposed Structure Granulation Quality: Red Fascia Exposed: No Necrotic Amount: Medium (34-66%) Fat Layer (Subcutaneous Tissue) Exposed: Yes Necrotic  Quality: Adherent Slough Tendon Exposed:  DAESON, [name] ([id number]) [id number] Page 1 of 8 Visit Report for 04/12/2023 Arrival Information Details Patient Name: Date of Service: [name], [name] 04/12/2023 3:00 PM Medical Record Number: [id number] Patient Account Number: [id number] Date of Birth/Sex: Treating RN: Jul 13, 1951 (72 y.o. [name] Primary Care Katryn Plummer: [name] Other Clinician: Referring Prateek Knipple: Treating Braeson Rupe/Extender: [name] in Treatment: 89 Visit Information History Since Last Visit Added or deleted any medications: No Patient Arrived: Dan Humphreys Any new allergies or adverse reactions: No Arrival Time: 15:29 Had a fall or experienced change in No Accompanied By: self activities of daily living that may affect Transfer Assistance: None risk of falls: Patient Identification Verified: Yes Signs or symptoms of abuse/neglect since last visito No Secondary Verification Process Completed: Yes Hospitalized since last visit: No Patient Requires Transmission-Based Precautions: No Implantable device outside of the clinic excluding No Patient Has Alerts: Yes cellular tissue based products placed in the center Patient Alerts: R ABI= N/C, TBI= .75 since last visit: L ABI =N/C, TBI = .57 Has Dressing in Place as Prescribed: Yes Has Compression in Place as Prescribed: Yes Pain Present Now: No Electronic Signature(s) Signed: 04/12/2023 3:33:31 PM By: [name] Entered By: [name] on 04/12/2023 12:30:13 -------------------------------------------------------------------------------- Compression Therapy Details Patient Name: Date of Service: [name] 04/12/2023 3:00 PM Medical Record Number: [id number] Patient Account Number: [id number] Date of Birth/Sex: Treating RN: 09/21/1950 (72 y.o. [name] Primary Care Asuncion Shibata: [name] Other Clinician: Referring Izek Corvino: Treating Ekaterini Capitano/Extender: [name] in Treatment: 61 Compression Therapy Performed for Wound Assessment: Wound #3 Right,Medial Lower Leg Performed By: Clinician [name], RN Compression Type: Three Layer Electronic Signature(s) Signed: 04/12/2023 3:33:31 PM By: [name] Entered By: [name] on 04/12/2023 12:32:27 -------------------------------------------------------------------------------- Encounter Discharge Information Details Patient Name: Date of Service: [name] 04/12/2023 3:00 PM Medical Record Number: [id number] Patient Account Number: [id number] KEEYON, KUDRICK ([id number]) [id number] Page 2 of 8 Date of Birth/Sex: Treating RN: Sep 01, 1950 (72 y.o. [name] Primary Care Dianne Bady: Other Clinician: [name] Referring Mukhtar Shams: Treating Reznor Ferrando/Extender: [name] in Treatment: 13 Encounter Discharge Information Items Discharge Condition: Stable Ambulatory Status: Walker Discharge Destination: Home Transportation: Private Auto Accompanied By: self Schedule Follow-up Appointment: Yes Clinical Summary of Care: Patient Declined Electronic Signature(s) Signed: 04/12/2023 3:33:31 PM By: [name] Entered By: [name] on 04/12/2023 12:32:55 -------------------------------------------------------------------------------- Negative Pressure Wound Therapy Maintenance (NPWT) Details Patient Name: Date of Service: [name] 04/12/2023 3:00 PM Medical Record Number: [id number] Patient Account Number: [id number] Date of Birth/Sex: Treating RN: 1951-06-27 (72 y.o. [name] Primary Care Johnelle Tafolla: [name] Other Clinician: Referring Tiaria Biby: Treating Oneka Parada/Extender: [name] in Treatment: 61 NPWT Maintenance Performed for: Wound #3 Right, Medial Lower Leg Additional Injuries Covered: Yes Additional Injuries:  Wound #6 Right, Anterior Lower Leg Performed By: [name], RN Type: VAC System Coverage Size (sq cm): 49.91 Pressure Type: Constant Pressure Setting: 125 mmHG Drain Type: None Primary Contact: Other : Sponge/Dressing Type: Foam- Black Date Initiated: 03/11/2023 Dressing Removed: Yes Quantity of Sponges/Gauze Removed: 1 Canister Changed: No Dressing Reapplied: Yes Quantity of Sponges/Gauze Inserted: 1 Days On NPWT : 33 Electronic Signature(s) Signed: 04/12/2023 3:33:31 PM By: [name] Entered By: [name] on 04/12/2023 12:32:08 -------------------------------------------------------------------------------- Patient/Caregiver Education Details Patient Name: Date of Service: [name] 9/17/2024andnbsp3:00 PM Medical Record Number: [id number] Patient Account Number: [id number] Date of Birth/Gender: Treating RN: 111/16/1952 (72 y.o. [name] Primary Care Physician: Milinda Cave,  DAESON, [name] ([id number]) [id number] Page 1 of 8 Visit Report for 04/12/2023 Arrival Information Details Patient Name: Date of Service: [name], [name] 04/12/2023 3:00 PM Medical Record Number: [id number] Patient Account Number: [id number] Date of Birth/Sex: Treating RN: Jul 13, 1951 (72 y.o. [name] Primary Care Katryn Plummer: [name] Other Clinician: Referring Prateek Knipple: Treating Braeson Rupe/Extender: [name] in Treatment: 89 Visit Information History Since Last Visit Added or deleted any medications: No Patient Arrived: Dan Humphreys Any new allergies or adverse reactions: No Arrival Time: 15:29 Had a fall or experienced change in No Accompanied By: self activities of daily living that may affect Transfer Assistance: None risk of falls: Patient Identification Verified: Yes Signs or symptoms of abuse/neglect since last visito No Secondary Verification Process Completed: Yes Hospitalized since last visit: No Patient Requires Transmission-Based Precautions: No Implantable device outside of the clinic excluding No Patient Has Alerts: Yes cellular tissue based products placed in the center Patient Alerts: R ABI= N/C, TBI= .75 since last visit: L ABI =N/C, TBI = .57 Has Dressing in Place as Prescribed: Yes Has Compression in Place as Prescribed: Yes Pain Present Now: No Electronic Signature(s) Signed: 04/12/2023 3:33:31 PM By: [name] Entered By: [name] on 04/12/2023 12:30:13 -------------------------------------------------------------------------------- Compression Therapy Details Patient Name: Date of Service: [name] 04/12/2023 3:00 PM Medical Record Number: [id number] Patient Account Number: [id number] Date of Birth/Sex: Treating RN: 09/21/1950 (72 y.o. [name] Primary Care Asuncion Shibata: [name] Other Clinician: Referring Izek Corvino: Treating Ekaterini Capitano/Extender: [name] in Treatment: 61 Compression Therapy Performed for Wound Assessment: Wound #3 Right,Medial Lower Leg Performed By: Clinician [name], RN Compression Type: Three Layer Electronic Signature(s) Signed: 04/12/2023 3:33:31 PM By: [name] Entered By: [name] on 04/12/2023 12:32:27 -------------------------------------------------------------------------------- Encounter Discharge Information Details Patient Name: Date of Service: [name] 04/12/2023 3:00 PM Medical Record Number: [id number] Patient Account Number: [id number] KEEYON, KUDRICK ([id number]) [id number] Page 2 of 8 Date of Birth/Sex: Treating RN: Sep 01, 1950 (72 y.o. [name] Primary Care Dianne Bady: Other Clinician: [name] Referring Mukhtar Shams: Treating Reznor Ferrando/Extender: [name] in Treatment: 13 Encounter Discharge Information Items Discharge Condition: Stable Ambulatory Status: Walker Discharge Destination: Home Transportation: Private Auto Accompanied By: self Schedule Follow-up Appointment: Yes Clinical Summary of Care: Patient Declined Electronic Signature(s) Signed: 04/12/2023 3:33:31 PM By: [name] Entered By: [name] on 04/12/2023 12:32:55 -------------------------------------------------------------------------------- Negative Pressure Wound Therapy Maintenance (NPWT) Details Patient Name: Date of Service: [name] 04/12/2023 3:00 PM Medical Record Number: [id number] Patient Account Number: [id number] Date of Birth/Sex: Treating RN: 1951-06-27 (72 y.o. [name] Primary Care Johnelle Tafolla: [name] Other Clinician: Referring Tiaria Biby: Treating Oneka Parada/Extender: [name] in Treatment: 61 NPWT Maintenance Performed for: Wound #3 Right, Medial Lower Leg Additional Injuries Covered: Yes Additional Injuries:  Wound #6 Right, Anterior Lower Leg Performed By: [name], RN Type: VAC System Coverage Size (sq cm): 49.91 Pressure Type: Constant Pressure Setting: 125 mmHG Drain Type: None Primary Contact: Other : Sponge/Dressing Type: Foam- Black Date Initiated: 03/11/2023 Dressing Removed: Yes Quantity of Sponges/Gauze Removed: 1 Canister Changed: No Dressing Reapplied: Yes Quantity of Sponges/Gauze Inserted: 1 Days On NPWT : 33 Electronic Signature(s) Signed: 04/12/2023 3:33:31 PM By: [name] Entered By: [name] on 04/12/2023 12:32:08 -------------------------------------------------------------------------------- Patient/Caregiver Education Details Patient Name: Date of Service: [name] 9/17/2024andnbsp3:00 PM Medical Record Number: [id number] Patient Account Number: [id number] Date of Birth/Gender: Treating RN: 1108/16/1952 (72 y.o. [name] Primary Care Physician: Milinda Cave,

## 2023-04-15 ENCOUNTER — Encounter (HOSPITAL_BASED_OUTPATIENT_CLINIC_OR_DEPARTMENT_OTHER): Payer: Medicare Other | Admitting: General Surgery

## 2023-04-15 DIAGNOSIS — I890 Lymphedema, not elsewhere classified: Secondary | ICD-10-CM | POA: Diagnosis not present

## 2023-04-15 DIAGNOSIS — M609 Myositis, unspecified: Secondary | ICD-10-CM | POA: Diagnosis not present

## 2023-04-15 DIAGNOSIS — L97521 Non-pressure chronic ulcer of other part of left foot limited to breakdown of skin: Secondary | ICD-10-CM | POA: Diagnosis not present

## 2023-04-15 DIAGNOSIS — L942 Calcinosis cutis: Secondary | ICD-10-CM | POA: Diagnosis not present

## 2023-04-15 DIAGNOSIS — L97812 Non-pressure chronic ulcer of other part of right lower leg with fat layer exposed: Secondary | ICD-10-CM | POA: Diagnosis not present

## 2023-04-15 DIAGNOSIS — I872 Venous insufficiency (chronic) (peripheral): Secondary | ICD-10-CM | POA: Diagnosis not present

## 2023-04-15 NOTE — Progress Notes (Signed)
cm) 5.5 Depth: (cm) 0.4 Area: (cm) 36.717 Volume: (cm) 14.687 % Reduction in Area: -587.5% % Reduction in Volume: -587.6% Epithelialization: Medium (34-66%) Tunneling: No Undermining: No Wound Description Classification: Full Thickness With Exposed Suppo Wound Margin: Indistinct, nonvisible Exudate Amount: Medium Exudate Type: Serous Exudate Color: amber rt Structures Foul Odor After Cleansing: No Slough/Fibrino Yes Wound Bed Granulation Amount: Medium (34-66%) Exposed Structure Granulation Quality: Red Fascia Exposed: No Necrotic Amount: Medium (34-66%) Fat Layer (Subcutaneous Tissue) Exposed: Yes Necrotic Quality: Adherent Slough Tendon Exposed: No Muscle Exposed: No Joint Exposed: No Bone Exposed: No Periwound Skin Texture Texture Color No Abnormalities Noted: Yes No Abnormalities Noted: No Erythema: No Moisture Rubor: Yes No Abnormalities Noted: Yes Temperature / Pain Temperature: No Abnormality Tenderness  on Palpation: Yes Treatment Notes Wound #3 (Lower Leg) Wound Laterality: Right, Medial Cleanser Peri-Wound Care Skin Prep Discharge Instruction: cavelon to macerated wound margins Sween Lotion (Moisturizing lotion) Discharge Instruction: Apply moisturizing lotion as directed Topical Gentamicin Discharge Instruction: in larger holes only Primary Dressing Maxorb Extra Ag+ Alginate Dressing, 4x4.75 (in/in) Discharge Instruction: Apply to excoriated weeping areas peel and place vac Secondary Dressing Secured With Compression Wrap ThreePress (3 layer compression wrap) Discharge Instruction: Apply three layer compression as directed. Compression Stockings Add-Ons Electronic Signature(s) Signed: 04/15/2023 12:45:54 PM By: Hunt Oris Entered By: Hunt Oris on 04/15/2023 08:35:54 [name] ([id number]) [id number] Page 10 of 14 -------------------------------------------------------------------------------- Wound Assessment Details Patient Name: Date of Service: [name], [name] 04/15/2023 11:00 A M Medical Record Number: [id number] Patient Account Number: [id number] Date of Birth/Sex: Treating RN: Sep 22, 1950 (72 y.o. [name], [name] Primary Care Jermiah Howton: [name] Other Clinician: Referring Anaiza Behrens: Treating Gaynor Ferreras/Extender: [name] in Treatment: 61 Wound Status Wound Number: 6 Primary Calcinosis Etiology: Wound Location: Right, Anterior Lower Leg Wound Open Wounding Event: Gradually Appeared Status: Date Acquired: 05/12/2022 Comorbid Cataracts, Anemia, Lymphedema, Congestive Heart Failure, Deep Weeks Of Treatment: 48 History: Vein Thrombosis, Hypertension, Peripheral Venous Disease Clustered Wound: Yes Photos Wound Measurements Length: (cm) Width: (cm) Depth: (cm) Clustered Quantity: Area: (cm) Volume: (cm) 1.8 % Reduction in Area: -139.4% 0.4 % Reduction in Volume: -1550% 0.7 Epithelialization: Large  (67-100%) 2 Tunneling: No 0.565 Undermining: No 0.396 Wound Description Classification: Full Thickness Without Exposed Sup Wound Margin: Epibole Exudate Amount: Medium Exudate Type: Serosanguineous Exudate Color: red, brown port Structures Foul Odor After Cleansing: No Slough/Fibrino Yes Wound Bed Granulation Amount: Large (67-100%) Exposed Structure Granulation Quality: Red Fascia Exposed: No Necrotic Amount: Small (1-33%) Fat Layer (Subcutaneous Tissue) Exposed: Yes Necrotic Quality: Adherent Slough Tendon Exposed: No Muscle Exposed: No Joint Exposed: No Bone Exposed: No Periwound Skin Texture Texture Color No Abnormalities Noted: Yes No Abnormalities Noted: No Hemosiderin Staining: Yes Moisture No Abnormalities Noted: Yes Temperature / Pain Temperature: No Abnormality Assessment Notes Probes to Calcium deposits Treatment Notes [name], [name] ([id number]) [id number] Page 11 of 14 Wound #6 (Lower Leg) Wound Laterality: Right, Anterior Cleanser Peri-Wound Care Sween Lotion (Moisturizing lotion) Discharge Instruction: Apply moisturizing lotion as directed Topical Primary Dressing peel and place vac Secondary Dressing Secured With Compression Wrap ThreePress (3 layer compression wrap) Discharge Instruction: Apply three layer compression as directed. Compression Stockings Add-Ons Electronic Signature(s) Signed: 04/15/2023 12:45:54 PM By: Hunt Oris Entered By: Hunt Oris on 04/15/2023 08:38:08 -------------------------------------------------------------------------------- Wound Assessment Details Patient Name: Date of Service: [name], [name] 04/15/2023 11:00 A M Medical Record Number: [id number] Patient Account Number: [id number] Date of Birth/Sex: Treating RN: 27-Dec-1950 (72 y.o. [name] Primary Care Tamlyn Sides: [name] Other Clinician: Referring  Bone: No Large (67-100%) N/A N/A N/A N/A N/A N/A N/A N/A N/A N/A N/A N/A N/A No Abnormalities Noted No Abnormalities Noted No Abnormalities Noted No Abnormality N/A N/A Compression Therapy N/A  N/A] Right, Lateral Lower Leg N/A N/A Wound Location: Gradually Appeared N/A N/A Wounding Event: Lymphedema N/A N/A Primary Etiology: Cataracts, Anemia, Lymphedema, N/A N/A Comorbid History: Congestive Heart Failure, Deep Vein Thrombosis, Hypertension, Peripheral Venous Disease 03/22/2023 N/A N/A Date Acquired: 2 N/A N/A Weeks  of Treatment: Healed - Epithelialized N/A N/A Wound Status: No N/A N/A Wound Recurrence: No N/A N/A Clustered Wound: N/A N/A N/A Clustered Quantity: 0x0x0 N/A N/A Measurements L x W x D (cm) 0 N/A N/A A (cm) : rea 0 N/A N/A Volume (cm) [name] ([id number]) [id number] Page 5 of 14 100.00% N/A N/A % Reduction in Area: 100.00% N/A N/A % Reduction in Volume: Full Thickness Without Exposed N/A N/A Classification: Support Structures None Present N/A N/A Exudate A mount: N/A N/A N/A Exudate Type: N/A N/A N/A Exudate Color: N/A N/A N/A Wound Margin: None Present (0%) N/A N/A Granulation A mount: N/A N/A N/A Granulation Quality: None Present (0%) N/A N/A Necrotic A mount: Fascia: No N/A N/A Exposed Structures: Fat Layer (Subcutaneous Tissue): No Tendon: No Muscle: No Joint: No Bone: No Large (67-100%) N/A N/A Epithelialization: N/A N/A N/A Debridement: N/A N/A N/A Pain Control: N/A N/A N/A Tissue Debrided: N/A N/A N/A Level: N/A N/A N/A Debridement A (sq cm): rea N/A N/A N/A Instrument: N/A N/A N/A Bleeding: N/A N/A N/A Hemostasis A chieved: N/A N/A N/A Procedural Pain: Debridement Treatment Response: N/A N/A N/A Post Debridement Measurements L x N/A N/A N/A W x D (cm) N/A N/A N/A Post Debridement Volume: (cm) No Abnormalities Noted N/A N/A Periwound Skin Texture: No Abnormalities Noted N/A N/A Periwound Skin Moisture: Erythema: No N/A N/A Periwound Skin Color: No Abnormality N/A N/A Temperature: N/A N/A N/A Assessment Notes: N/A N/A N/A Procedures Performed: Treatment Notes Electronic Signature(s) Signed: 04/15/2023 11:54:16 AM By: Duanne Guess MD FACS Entered By: Duanne Guess on 04/15/2023 08:54:15 -------------------------------------------------------------------------------- Multi-Disciplinary Care Plan Details Patient Name: Date of Service: [name] 04/15/2023 11:00 A M Medical Record  Number: [id number] Patient Account Number: [id number] Date of Birth/Sex: Treating RN: 26-Sep-1950 (72 y.o. [name] Primary Care Adorian Gwynne: [name] Other Clinician: Referring Malakie Balis: Treating Simran Mannis/Extender: [name] in Treatment: 65 Multidisciplinary Care Plan reviewed with physician Active Inactive Venous Leg Ulcer Nursing Diagnoses: Knowledge deficit related to disease process and management Potential for venous Insuffiency (use before diagnosis confirmed) Goals: Patient will maintain optimal edema control Date Initiated: 02/17/2022 Target Resolution Date: 04/22/2023 Goal Status: Active Interventions: GEORDY, LEVE ([id number]) [id number] Page 6 of 14 Assess peripheral edema status every visit. Compression as ordered Provide education on venous insufficiency Treatment Activities: Non-invasive vascular studies : 02/17/2022 Therapeutic compression applied : 02/17/2022 Notes: Wound/Skin Impairment Nursing Diagnoses: Impaired tissue integrity Knowledge deficit related to ulceration/compromised skin integrity Goals: Patient/caregiver will verbalize understanding of skin care regimen Date Initiated: 02/17/2022 Target Resolution Date: 04/22/2023 Goal Status: Active Ulcer/skin breakdown will have a volume reduction of 30% by week 4 Date Initiated: 02/17/2022 Date Inactivated: 03/10/2022 Target Resolution Date: 03/10/2022 Unmet Reason: infection being treated, Goal Status: Unmet waiting on derm appte Ulcer/skin breakdown will have a volume reduction of 50% by week 8 Date Initiated: 03/10/2022 Date Inactivated: 04/14/2022 Target Resolution Date: 04/07/2022 Goal Status: Unmet Unmet Reason: calcinosis Ulcer/skin breakdown will have a volume reduction of 80% by week 12 Date  Bone: No Large (67-100%) N/A N/A N/A N/A N/A N/A N/A N/A N/A N/A N/A N/A N/A No Abnormalities Noted No Abnormalities Noted No Abnormalities Noted No Abnormality N/A N/A Compression Therapy N/A  N/A] Right, Lateral Lower Leg N/A N/A Wound Location: Gradually Appeared N/A N/A Wounding Event: Lymphedema N/A N/A Primary Etiology: Cataracts, Anemia, Lymphedema, N/A N/A Comorbid History: Congestive Heart Failure, Deep Vein Thrombosis, Hypertension, Peripheral Venous Disease 03/22/2023 N/A N/A Date Acquired: 2 N/A N/A Weeks  of Treatment: Healed - Epithelialized N/A N/A Wound Status: No N/A N/A Wound Recurrence: No N/A N/A Clustered Wound: N/A N/A N/A Clustered Quantity: 0x0x0 N/A N/A Measurements L x W x D (cm) 0 N/A N/A A (cm) : rea 0 N/A N/A Volume (cm) [name] ([id number]) [id number] Page 5 of 14 100.00% N/A N/A % Reduction in Area: 100.00% N/A N/A % Reduction in Volume: Full Thickness Without Exposed N/A N/A Classification: Support Structures None Present N/A N/A Exudate A mount: N/A N/A N/A Exudate Type: N/A N/A N/A Exudate Color: N/A N/A N/A Wound Margin: None Present (0%) N/A N/A Granulation A mount: N/A N/A N/A Granulation Quality: None Present (0%) N/A N/A Necrotic A mount: Fascia: No N/A N/A Exposed Structures: Fat Layer (Subcutaneous Tissue): No Tendon: No Muscle: No Joint: No Bone: No Large (67-100%) N/A N/A Epithelialization: N/A N/A N/A Debridement: N/A N/A N/A Pain Control: N/A N/A N/A Tissue Debrided: N/A N/A N/A Level: N/A N/A N/A Debridement A (sq cm): rea N/A N/A N/A Instrument: N/A N/A N/A Bleeding: N/A N/A N/A Hemostasis A chieved: N/A N/A N/A Procedural Pain: Debridement Treatment Response: N/A N/A N/A Post Debridement Measurements L x N/A N/A N/A W x D (cm) N/A N/A N/A Post Debridement Volume: (cm) No Abnormalities Noted N/A N/A Periwound Skin Texture: No Abnormalities Noted N/A N/A Periwound Skin Moisture: Erythema: No N/A N/A Periwound Skin Color: No Abnormality N/A N/A Temperature: N/A N/A N/A Assessment Notes: N/A N/A N/A Procedures Performed: Treatment Notes Electronic Signature(s) Signed: 04/15/2023 11:54:16 AM By: Duanne Guess MD FACS Entered By: Duanne Guess on 04/15/2023 08:54:15 -------------------------------------------------------------------------------- Multi-Disciplinary Care Plan Details Patient Name: Date of Service: [name] 04/15/2023 11:00 A M Medical Record  Number: [id number] Patient Account Number: [id number] Date of Birth/Sex: Treating RN: 26-Sep-1950 (72 y.o. [name] Primary Care Adorian Gwynne: [name] Other Clinician: Referring Malakie Balis: Treating Simran Mannis/Extender: [name] in Treatment: 65 Multidisciplinary Care Plan reviewed with physician Active Inactive Venous Leg Ulcer Nursing Diagnoses: Knowledge deficit related to disease process and management Potential for venous Insuffiency (use before diagnosis confirmed) Goals: Patient will maintain optimal edema control Date Initiated: 02/17/2022 Target Resolution Date: 04/22/2023 Goal Status: Active Interventions: GEORDY, LEVE ([id number]) [id number] Page 6 of 14 Assess peripheral edema status every visit. Compression as ordered Provide education on venous insufficiency Treatment Activities: Non-invasive vascular studies : 02/17/2022 Therapeutic compression applied : 02/17/2022 Notes: Wound/Skin Impairment Nursing Diagnoses: Impaired tissue integrity Knowledge deficit related to ulceration/compromised skin integrity Goals: Patient/caregiver will verbalize understanding of skin care regimen Date Initiated: 02/17/2022 Target Resolution Date: 04/22/2023 Goal Status: Active Ulcer/skin breakdown will have a volume reduction of 30% by week 4 Date Initiated: 02/17/2022 Date Inactivated: 03/10/2022 Target Resolution Date: 03/10/2022 Unmet Reason: infection being treated, Goal Status: Unmet waiting on derm appte Ulcer/skin breakdown will have a volume reduction of 50% by week 8 Date Initiated: 03/10/2022 Date Inactivated: 04/14/2022 Target Resolution Date: 04/07/2022 Goal Status: Unmet Unmet Reason: calcinosis Ulcer/skin breakdown will have a volume reduction of 80% by week 12 Date  Initiated: 04/14/2022 Date Inactivated: 05/05/2022 Target Resolution Date: 05/05/2022 Unmet Reason: infection, chronic Goal Status:  Unmet condition Interventions: Assess patient/caregiver ability to obtain necessary supplies Assess patient/caregiver ability to perform ulcer/skin care regimen upon admission and as needed Assess ulceration(s) every visit Provide education on ulcer and skin care Treatment Activities: Skin care regimen initiated : 02/17/2022 Topical wound management initiated : 02/17/2022 Notes: Electronic Signature(s) Signed: 04/15/2023 12:43:41 PM By: Zenaida Deed RN, BSN Signed: 04/15/2023 12:45:54 PM By: Hunt Oris Entered By: Hunt Oris on 04/15/2023 08:54:40 -------------------------------------------------------------------------------- Negative Pressure Wound Therapy Maintenance (NPWT) Details Patient Name: Date of Service: JIOVONNI, GOGGINS 04/15/2023 11:00 A M Medical Record Number: [id number] Patient Account Number: [id number] Date of Birth/Sex: Treating RN: March 13, 1951 (72 y.o. [name] Primary Care Pearson Picou: [name] Other Clinician: Referring Aniza Shor: Treating Matvey Llanas/Extender: [name] in Treatment: 61 NPWT Maintenance Performed for: Wound #3 Right, Medial Lower Leg Additional Injuries Covered: Yes Additional Injuries: Wound #6 Right, Anterior Lower Leg Performed By: Zenaida Deed, RN Type: VAC System Coverage Size (sq cm): 47.47 Pressure Type: Constant Pressure Setting: 125 mmHG Drain Type: None Primary Contact: None Sponge/Dressing Type: [place] Arace, [name] ([id number]) [id number] Page 7 of 14 Date Initiated: 03/11/2023 Dressing Removed: Yes Quantity of Sponges/Gauze Removed: 1 Canister Changed: No Canister Exudate Volume: 100 Dressing Reapplied: Yes Quantity of Sponges/Gauze Inserted: 1 Respones T Treatment: o good Days On NPWT : 36 Post Procedure Diagnosis Same as Pre-procedure Notes peel and place dressing Electronic Signature(s) Signed: 04/15/2023 12:43:41 PM By: Zenaida Deed RN,  BSN Entered By: Zenaida Deed on 04/15/2023 09:35:14 -------------------------------------------------------------------------------- Pain Assessment Details Patient Name: Date of Service: [name] 04/15/2023 11:00 A M Medical Record Number: [id number] Patient Account Number: [id number] Date of Birth/Sex: Treating RN: 1950-12-03 (72 y.o. [name] Primary Care Casy Brunetto: [name] Other Clinician: Referring Kein Carlberg: Treating Smrithi Pigford/Extender: [name] in Treatment: 48 Active Problems Location of Pain Severity and Description of Pain Patient Has Paino No Site Locations With Dressing Change: No Rate the pain. Current Pain Level: 0 Pain Management and Medication Current Pain Management: Electronic Signature(s) Signed: 04/15/2023 12:45:54 PM By: Hunt Oris Entered By: Hunt Oris on 04/15/2023 08:16:03 [name] ([id number]) [id number] Page 8 of 14 -------------------------------------------------------------------------------- Patient/Caregiver Education Details Patient Name: Date of Service: [name], [name] 9/20/2024andnbsp11:00 A M Medical Record Number: [id number] Patient Account Number: [id number] Date of Birth/Gender: Treating RN: 05-Jun-1951 (72 y.o. [name] Primary Care Physician: [name] Other Clinician: Referring Physician: Treating Physician/Extender: [name] in Treatment: 3 Education Assessment Education Provided To: Patient Education Topics Provided Infection: Methods: Explain/Verbal Responses: Reinforcements needed, State content correctly Venous: Methods: Explain/Verbal Responses: Reinforcements needed, State content correctly Electronic Signature(s) Signed: 04/15/2023 12:43:41 PM By: Zenaida Deed RN, BSN Entered By: Zenaida Deed on 04/15/2023  07:43:50 -------------------------------------------------------------------------------- Wound Assessment Details Patient Name: Date of Service: [name], [name] 04/15/2023 11:00 A M Medical Record Number: [id number] Patient Account Number: [id number] Date of Birth/Sex: Treating RN: 04/07/1951 (72 y.o. [name] Primary Care Ronne Savoia: [name] Other Clinician: Referring Hassell Patras: Treating Charvez Voorhies/Extender: [name] in Treatment: 61 Wound Status Wound Number: 3 Primary Lymphedema Etiology: Wound Location: Right, Medial Lower Leg Wound Open Wounding Event: Gradually Appeared Status: Date Acquired: 12/07/2021 Comorbid Cataracts, Anemia, Lymphedema, Congestive Heart Failure, Deep Weeks Of Treatment: 61 History: Vein Thrombosis, Hypertension, Peripheral Venous Disease Clustered Wound: No Photos XZAYVION, FLAIM ([id number]) [id number] Page 9 of 14 Wound Measurements Length: (cm) 8.5 Width: (  [name], [name] ([id number]) [id number] Page 1 of 14 Visit Report for 04/15/2023 Arrival Information Details Patient Name: Date of Service: [name], [name] 04/15/2023 11:00 A M Medical Record Number: [id number] Patient Account Number: [id number] Date of Birth/Sex: Treating RN: 1951/07/01 (72 y.o. [name], [name] Primary Care Najir Roop: [name] Other Clinician: Referring Maren Wiesen: Treating Kyrese Gartman/Extender: [name] in Treatment: 55 Visit Information History Since Last Visit All ordered tests and consults were completed: No Patient Arrived: Wheel Chair Added or deleted any medications: No Arrival Time: 11:13 Any new allergies or adverse reactions: No Accompanied By: self Had a fall or experienced change in No Transfer Assistance: None activities of daily living that may affect Patient Identification Verified: Yes risk of falls: Secondary Verification Process Completed: Yes Signs or symptoms of abuse/neglect since last visito No Patient Requires Transmission-Based Precautions: No Hospitalized since last visit: No Patient Has Alerts: Yes Implantable device outside of the clinic excluding No Patient Alerts: R ABI= N/C, TBI= .75 cellular tissue based products placed in the center L ABI =N/C, TBI = .57 since last visit: Has Dressing in Place as Prescribed: Yes Has Compression in Place as Prescribed: Yes Pain Present Now: No Electronic Signature(s) Signed: 04/15/2023 12:45:54 PM By: Hunt Oris Entered By: Hunt Oris on 04/15/2023 08:14:40 -------------------------------------------------------------------------------- Compression Therapy Details Patient Name: Date of Service: [name], [name] 04/15/2023 11:00 A M Medical Record Number: [id number] Patient Account Number: [id number] Date of Birth/Sex: Treating RN: [birth year] (72 y.o. [name] Primary Care Luman Holway: [name] Other Clinician: Referring  Doyal Saric: Treating Libbi Towner/Extender: [name] in Treatment: 61 Compression Therapy Performed for Wound Assessment: Wound #8 Left,Dorsal Foot Performed By: Vanna Scotland, RN Compression Type: Three Layer Post Procedure Diagnosis Same as Pre-procedure Electronic Signature(s) Signed: 04/15/2023 12:45:54 PM By: Hunt Oris Entered By: Hunt Oris on 04/15/2023 08:50:55 [name] ([id number]) [id number] Page 2 of 14 -------------------------------------------------------------------------------- Compression Therapy Details Patient Name: Date of Service: [name], [name] 04/15/2023 11:00 A M Medical Record Number: [id number] Patient Account Number: [id number] Date of Birth/Sex: Treating RN: May 02, 1951 (72 y.o. [name] Primary Care Gudrun Axe: [name] Other Clinician: Referring Allante Whitmire: Treating Abbigail Anstey/Extender: [name] in Treatment: 61 Compression Therapy Performed for Wound Assessment: Wound #3 Right,Medial Lower Leg Performed By: Vanna Scotland, RN Compression Type: Three Layer Post Procedure Diagnosis Same as Pre-procedure Electronic Signature(s) Signed: 04/15/2023 12:45:54 PM By: Hunt Oris Entered By: Hunt Oris on 04/15/2023 08:50:56 -------------------------------------------------------------------------------- Encounter Discharge Information Details Patient Name: Date of Service: [name] 04/15/2023 11:00 A M Medical Record Number: [id number] Patient Account Number: [id number] Date of Birth/Sex: Treating RN: [birth year] (72 y.o. [name] Primary Care Courtez Twaddle: [name] Other Clinician: Referring Khyleigh Furney: Treating Ikaika Showers/Extender: [name] in Treatment: 26 Encounter Discharge Information Items Post Procedure Vitals Discharge Condition: Stable Temperature (F): 97.7 Ambulatory Status: Wheelchair Pulse (bpm):  66 Discharge Destination: Home Respiratory Rate (breaths/min): 18 Transportation: Private Auto Blood Pressure (mmHg): 125/69 Accompanied By: self Schedule Follow-up Appointment: Yes Clinical Summary of Care: Patient Declined Electronic Signature(s) Signed: 04/15/2023 12:45:54 PM By: Hunt Oris Entered By: Hunt Oris on 04/15/2023 09:23:04 -------------------------------------------------------------------------------- Lower Extremity Assessment Details Patient Name: Date of Service: [name], [name] 04/15/2023 11:00 A M Medical Record Number: [id number] Patient Account Number: [id number] Date of Birth/Sex: Treating RN: [birth year] (72 y.o. [name] Primary Care Kaci Freel: [name] Other Clinician: Referring Brittnei Jagiello: Treating Lamont Glasscock/Extender: [name] in Treatment: 61 Edema Assessment Left: [Left:  cm) 5.5 Depth: (cm) 0.4 Area: (cm) 36.717 Volume: (cm) 14.687 % Reduction in Area: -587.5% % Reduction in Volume: -587.6% Epithelialization: Medium (34-66%) Tunneling: No Undermining: No Wound Description Classification: Full Thickness With Exposed Suppo Wound Margin: Indistinct, nonvisible Exudate Amount: Medium Exudate Type: Serous Exudate Color: amber rt Structures Foul Odor After Cleansing: No Slough/Fibrino Yes Wound Bed Granulation Amount: Medium (34-66%) Exposed Structure Granulation Quality: Red Fascia Exposed: No Necrotic Amount: Medium (34-66%) Fat Layer (Subcutaneous Tissue) Exposed: Yes Necrotic Quality: Adherent Slough Tendon Exposed: No Muscle Exposed: No Joint Exposed: No Bone Exposed: No Periwound Skin Texture Texture Color No Abnormalities Noted: Yes No Abnormalities Noted: No Erythema: No Moisture Rubor: Yes No Abnormalities Noted: Yes Temperature / Pain Temperature: No Abnormality Tenderness  on Palpation: Yes Treatment Notes Wound #3 (Lower Leg) Wound Laterality: Right, Medial Cleanser Peri-Wound Care Skin Prep Discharge Instruction: cavelon to macerated wound margins Sween Lotion (Moisturizing lotion) Discharge Instruction: Apply moisturizing lotion as directed Topical Gentamicin Discharge Instruction: in larger holes only Primary Dressing Maxorb Extra Ag+ Alginate Dressing, 4x4.75 (in/in) Discharge Instruction: Apply to excoriated weeping areas peel and place vac Secondary Dressing Secured With Compression Wrap ThreePress (3 layer compression wrap) Discharge Instruction: Apply three layer compression as directed. Compression Stockings Add-Ons Electronic Signature(s) Signed: 04/15/2023 12:45:54 PM By: Hunt Oris Entered By: Hunt Oris on 04/15/2023 08:35:54 [name] ([id number]) [id number] Page 10 of 14 -------------------------------------------------------------------------------- Wound Assessment Details Patient Name: Date of Service: [name], [name] 04/15/2023 11:00 A M Medical Record Number: [id number] Patient Account Number: [id number] Date of Birth/Sex: Treating RN: Sep 22, 1950 (72 y.o. [name], [name] Primary Care Jermiah Howton: [name] Other Clinician: Referring Anaiza Behrens: Treating Gaynor Ferreras/Extender: [name] in Treatment: 61 Wound Status Wound Number: 6 Primary Calcinosis Etiology: Wound Location: Right, Anterior Lower Leg Wound Open Wounding Event: Gradually Appeared Status: Date Acquired: 05/12/2022 Comorbid Cataracts, Anemia, Lymphedema, Congestive Heart Failure, Deep Weeks Of Treatment: 48 History: Vein Thrombosis, Hypertension, Peripheral Venous Disease Clustered Wound: Yes Photos Wound Measurements Length: (cm) Width: (cm) Depth: (cm) Clustered Quantity: Area: (cm) Volume: (cm) 1.8 % Reduction in Area: -139.4% 0.4 % Reduction in Volume: -1550% 0.7 Epithelialization: Large  (67-100%) 2 Tunneling: No 0.565 Undermining: No 0.396 Wound Description Classification: Full Thickness Without Exposed Sup Wound Margin: Epibole Exudate Amount: Medium Exudate Type: Serosanguineous Exudate Color: red, brown port Structures Foul Odor After Cleansing: No Slough/Fibrino Yes Wound Bed Granulation Amount: Large (67-100%) Exposed Structure Granulation Quality: Red Fascia Exposed: No Necrotic Amount: Small (1-33%) Fat Layer (Subcutaneous Tissue) Exposed: Yes Necrotic Quality: Adherent Slough Tendon Exposed: No Muscle Exposed: No Joint Exposed: No Bone Exposed: No Periwound Skin Texture Texture Color No Abnormalities Noted: Yes No Abnormalities Noted: No Hemosiderin Staining: Yes Moisture No Abnormalities Noted: Yes Temperature / Pain Temperature: No Abnormality Assessment Notes Probes to Calcium deposits Treatment Notes [name], [name] ([id number]) [id number] Page 11 of 14 Wound #6 (Lower Leg) Wound Laterality: Right, Anterior Cleanser Peri-Wound Care Sween Lotion (Moisturizing lotion) Discharge Instruction: Apply moisturizing lotion as directed Topical Primary Dressing peel and place vac Secondary Dressing Secured With Compression Wrap ThreePress (3 layer compression wrap) Discharge Instruction: Apply three layer compression as directed. Compression Stockings Add-Ons Electronic Signature(s) Signed: 04/15/2023 12:45:54 PM By: Hunt Oris Entered By: Hunt Oris on 04/15/2023 08:38:08 -------------------------------------------------------------------------------- Wound Assessment Details Patient Name: Date of Service: [name], [name] 04/15/2023 11:00 A M Medical Record Number: [id number] Patient Account Number: [id number] Date of Birth/Sex: Treating RN: 27-Dec-1950 (72 y.o. [name] Primary Care Tamlyn Sides: [name] Other Clinician: Referring  [name], [name] ([id number]) [id number] Page 1 of 14 Visit Report for 04/15/2023 Arrival Information Details Patient Name: Date of Service: [name], [name] 04/15/2023 11:00 A M Medical Record Number: [id number] Patient Account Number: [id number] Date of Birth/Sex: Treating RN: 1951/07/01 (72 y.o. [name], [name] Primary Care Najir Roop: [name] Other Clinician: Referring Maren Wiesen: Treating Kyrese Gartman/Extender: [name] in Treatment: 55 Visit Information History Since Last Visit All ordered tests and consults were completed: No Patient Arrived: Wheel Chair Added or deleted any medications: No Arrival Time: 11:13 Any new allergies or adverse reactions: No Accompanied By: self Had a fall or experienced change in No Transfer Assistance: None activities of daily living that may affect Patient Identification Verified: Yes risk of falls: Secondary Verification Process Completed: Yes Signs or symptoms of abuse/neglect since last visito No Patient Requires Transmission-Based Precautions: No Hospitalized since last visit: No Patient Has Alerts: Yes Implantable device outside of the clinic excluding No Patient Alerts: R ABI= N/C, TBI= .75 cellular tissue based products placed in the center L ABI =N/C, TBI = .57 since last visit: Has Dressing in Place as Prescribed: Yes Has Compression in Place as Prescribed: Yes Pain Present Now: No Electronic Signature(s) Signed: 04/15/2023 12:45:54 PM By: Hunt Oris Entered By: Hunt Oris on 04/15/2023 08:14:40 -------------------------------------------------------------------------------- Compression Therapy Details Patient Name: Date of Service: [name], [name] 04/15/2023 11:00 A M Medical Record Number: [id number] Patient Account Number: [id number] Date of Birth/Sex: Treating RN: [birth year] (72 y.o. [name] Primary Care Luman Holway: [name] Other Clinician: Referring  Doyal Saric: Treating Libbi Towner/Extender: [name] in Treatment: 61 Compression Therapy Performed for Wound Assessment: Wound #8 Left,Dorsal Foot Performed By: Vanna Scotland, RN Compression Type: Three Layer Post Procedure Diagnosis Same as Pre-procedure Electronic Signature(s) Signed: 04/15/2023 12:45:54 PM By: Hunt Oris Entered By: Hunt Oris on 04/15/2023 08:50:55 [name] ([id number]) [id number] Page 2 of 14 -------------------------------------------------------------------------------- Compression Therapy Details Patient Name: Date of Service: [name], [name] 04/15/2023 11:00 A M Medical Record Number: [id number] Patient Account Number: [id number] Date of Birth/Sex: Treating RN: May 02, 1951 (72 y.o. [name] Primary Care Gudrun Axe: [name] Other Clinician: Referring Allante Whitmire: Treating Abbigail Anstey/Extender: [name] in Treatment: 61 Compression Therapy Performed for Wound Assessment: Wound #3 Right,Medial Lower Leg Performed By: Vanna Scotland, RN Compression Type: Three Layer Post Procedure Diagnosis Same as Pre-procedure Electronic Signature(s) Signed: 04/15/2023 12:45:54 PM By: Hunt Oris Entered By: Hunt Oris on 04/15/2023 08:50:56 -------------------------------------------------------------------------------- Encounter Discharge Information Details Patient Name: Date of Service: [name] 04/15/2023 11:00 A M Medical Record Number: [id number] Patient Account Number: [id number] Date of Birth/Sex: Treating RN: [birth year] (72 y.o. [name] Primary Care Courtez Twaddle: [name] Other Clinician: Referring Khyleigh Furney: Treating Ikaika Showers/Extender: [name] in Treatment: 26 Encounter Discharge Information Items Post Procedure Vitals Discharge Condition: Stable Temperature (F): 97.7 Ambulatory Status: Wheelchair Pulse (bpm):  66 Discharge Destination: Home Respiratory Rate (breaths/min): 18 Transportation: Private Auto Blood Pressure (mmHg): 125/69 Accompanied By: self Schedule Follow-up Appointment: Yes Clinical Summary of Care: Patient Declined Electronic Signature(s) Signed: 04/15/2023 12:45:54 PM By: Hunt Oris Entered By: Hunt Oris on 04/15/2023 09:23:04 -------------------------------------------------------------------------------- Lower Extremity Assessment Details Patient Name: Date of Service: [name], [name] 04/15/2023 11:00 A M Medical Record Number: [id number] Patient Account Number: [id number] Date of Birth/Sex: Treating RN: [birth year] (72 y.o. [name] Primary Care Kaci Freel: [name] Other Clinician: Referring Brittnei Jagiello: Treating Lamont Glasscock/Extender: [name] in Treatment: 61 Edema Assessment Left: [Left:

## 2023-04-15 NOTE — Progress Notes (Signed)
System Lacking: No; Transportation Concerns: No Electronic Signature(s) Signed: 04/15/2023 12:02:24 PM By: [name] Guess MD FACS Entered By: [name] on 04/15/2023 08:55:22 -------------------------------------------------------------------------------- SuperBill Details Patient Name: Date of Service: [name], [name] 04/15/2023 Medical Record Number: [id number] Patient Account Number: [id number] Date of Birth/Sex: Treating RN: Sep 26, 1950 (72 y.o. M) Primary Care Provider: Nicoletta [name] Other Clinician: Referring Provider: Treating Provider/Extender: [name] in Treatment: [address] 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) I89.0 Lymphedema, not elsewhere classified I10 Essential (primary) hypertension I25.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 angina pectoris Facility Procedures : CPT4 Code: 25366440 Description: 97597 - DEBRIDE WOUND 1ST 20 SQ CM OR < ICD-10 Diagnosis Description 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ed Modifier: Quantity: 1 : CPT4 Code: 34742595 Description: (Facility Use Only) 29581LT - APPLY MULTLAY COMPRS LWR LT LEG Modifier: 59 Quantity: 1 Physician Procedures : CPT4 Code Description Modifier [id number] 99214 - WC PHYS LEVEL 4 - EST PT 25 ICD-10 Diagnosis Description 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  A M Medical Record Number: [id number] Patient Account Number: [id number] Date of Birth/Sex: Treating RN: 22-Feb-1951 (72 y.o. 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/09/2022 No Yes I87.2 Venous insufficiency (chronic) (peripheral) 02/09/2022 No Yes I89.0 Lymphedema, not elsewhere classified 02/09/2022 No Yes [name], [name] ([id number]) [id number] Page 8 of 16 I10 Essential (primary) hypertension 02/09/2022 No Yes I25.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 angina pectoris 02/09/2022 No Yes Inactive Problems ICD-10 Code Description Active Date Inactive Date L97.512 Non-pressure chronic ulcer of other part of right foot with fat layer exposed 02/17/2022 02/17/2022 Resolved Problems ICD-10 Code Description Active Date Resolved Date L97.821 Non-pressure chronic ulcer of other part of left lower leg limited to breakdown of skin 03/11/2023 03/11/2023 L98.492 Non-pressure chronic ulcer of skin of other sites with fat layer exposed 03/11/2023 03/11/2023 Electronic Signature(s) Signed: 04/15/2023 11:54:02 AM By: [name] Guess MD FACS Entered By: [name] on 04/15/2023 08:54:02 -------------------------------------------------------------------------------- Progress Note Details Patient Name: Date of Service: [name] 04/15/2023 11:00 A M Medical Record Number: [id number] Patient Account Number: [id number] Date of Birth/Sex: Treating RN: 04-04-1951 (72 y.o. M) Primary Care Provider: Nicoletta [name] Other Clinician: Referring Provider: Treating Provider/Extender: [name] in Treatment: 35 Subjective Chief Complaint Information obtained from Patient Patient seen for complaints of Non-Healing Wounds. History of Present Illness (HPI) ADMISSION 02/09/2022 This is a 72 year old man who recently moved to  [place] from [place]. He has a history of of coronary artery disease and prior left popliteal vein DVT . He is not diabetic and does not smoke. 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 He has had multiple spinal surgeries and has limited mobility. He primarily uses a motorized wheelchair. He has had lymphedema for many years and does have lymphedema pumps although he says he does not use them frequently. He has wounds on his bilateral lower extremities. He was receiving care in [place] for these prior to his move. They were apparently packing his wounds with Betadine soaked gauze. He has worn compression wraps in the past but not recently. He did say that they helped tremendously. In clinic today, his right ABI is noncompressible but has good signals; the left ABI was 1.17. No formal vascular studies have been performed. On his left dorsal foot, there is a circular lesion that exposes the fat layer. There is fairly heavy slough accumulation within the wound, but no significant odor. On his right medial calf, he has multiple pinholes that have slough buildup in them and probe for about 2 to 4 mm in depth. They are draining clear fluid. On his right lateral midfoot, he has ulceration consistent with venous stasis. It is geographic and has slough accumulation. 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 02/17/2022: All of the wounds are little bit larger today and have more slough accumulation. Today, the medial calf openings are larger and probe to something that feels calcified. There is odor coming from his wounds. His  prior to dressing change. Peri-Wound Care: Sween Lotion (Moisturizing lotion) 1 x Per Week/30 Days Discharge Instructions: Apply moisturizing lotion as directed Prim Dressing: Maxorb Extra Ag+ Alginate Dressing, 2x2 (in/in) 1 x Per Week/30 Days ary Discharge Instructions: Apply to wound bed as instructed Secondary Dressing: Woven Gauze Sponge, Non-Sterile 4x4 in 1 x Per Week/30 Days Discharge Instructions: Apply over primary dressing as directed. Com pression Wrap: ThreePress (3 layer compression wrap) 1 x Per Week/30 Days Discharge Instructions: Apply three layer compression as directed. [name], [name] ([id number]) [id number] Page 14 of 16 04/15/2023: The improvement continues with significantly less drainage, resulting in less excoriation and irritation. The back of his right lower leg is closed, as is the left dorsal foot wound. The open areas on the right medial leg are smaller. I used a curette to debride slough from the right medial leg wound. We will continue topical gentamicin to the open areas, silver alginate to the periwound excoriated skin and negative pressure wound therapy focused on the large open areas. Continue 3 layer compression/equivalent. As he did not bring his juxta lite stockings with him  today, we will wrap his left leg, as well. As he seems to only improve while he is on levofloxacin, I have sent in another prescription for an additional 2 weeks of therapy. He will have a nurse visit early in the week and follow-up with me a week from today. Electronic Signature(s) Signed: 04/15/2023 12:01:31 PM By: [name] Guess MD FACS Previous Signature: 04/15/2023 11:57:33 AM Version By: [name] Guess MD FACS Entered By: [name] on 04/15/2023 09:01:31 -------------------------------------------------------------------------------- HxROS Details Patient Name: Date of Service: [name] 04/15/2023 11:00 A M Medical Record Number: [id number] Patient Account Number: [id number] Date of Birth/Sex: Treating RN: Jan 03, 1951 (72 y.o. M) Primary Care Provider: Nicoletta [name] Other Clinician: Referring Provider: Treating Provider/Extender: [name] in Treatment: 32 Information Obtained From Patient Chart Constitutional Symptoms (General Health) Medical History: Past Medical History Notes: obesity Eyes Medical History: Positive for: Cataracts - right eye removed Negative for: Glaucoma; Optic Neuritis Ear/Nose/Mouth/Throat Medical History: Negative for: Chronic sinus problems/congestion; Middle ear problems Past Medical History Notes: hard of hearing Hematologic/Lymphatic Medical History: Positive for: Anemia - macrocytic; Lymphedema Respiratory Medical History: Past Medical History Notes: frozen diaphragm right Cardiovascular Medical History: Positive for: Congestive Heart Failure; Deep Vein Thrombosis - left leg; Hypertension; Peripheral Venous Disease Past Medical History Notes: hyperlipidemia Endocrine Medical History: Negative for: Type I Diabetes; Type II Diabetes LAVEL, ATLEE ([id number]) [id number] Page 15 of 16 Medical History: Negative for: End Stage Renal Disease Integumentary  (Skin) Medical History: Negative for: History of Burn Musculoskeletal Medical History: Past Medical History Notes: myositis, lumbar DDD, spinal stenosis, cervical facet joint syndrome Oncologic Medical History: Negative for: Received Chemotherapy; Received Radiation Psychiatric Medical History: Negative for: Anorexia/bulimia; Confinement Anxiety HBO Extended History Items Eyes: Cataracts Immunizations Pneumococcal Vaccine: Received Pneumococcal Vaccination: Yes Received Pneumococcal Vaccination On or After 60th Birthday: Yes Implantable Devices No devices added Hospitalization / Surgery History Type of Hospitalization/Surgery cervical fusion lumbar surgery coronary stent placement lasik eye surgery hydrocele excision Family and Social History Cancer: Yes - Mother; Diabetes: No; Heart Disease: Yes - Father,Mother; Hereditary Spherocytosis: No; Hypertension: Yes - Father,Siblings; Kidney Disease: No; Lung Disease: Yes - Siblings; Seizures: No; Stroke: No; Thyroid Problems: No; Tuberculosis: No; Former smoker - quit 1991; Marital Status - Married; Alcohol Use: Moderate; Drug Use: No History; Caffeine Use: Daily - coffee; Financial Concerns: No; Food, Clothing or Shelter Needs: No; Support  System Lacking: No; Transportation Concerns: No Electronic Signature(s) Signed: 04/15/2023 12:02:24 PM By: [name] Guess MD FACS Entered By: [name] on 04/15/2023 08:55:22 -------------------------------------------------------------------------------- SuperBill Details Patient Name: Date of Service: [name], [name] 04/15/2023 Medical Record Number: [id number] Patient Account Number: [id number] Date of Birth/Sex: Treating RN: Sep 26, 1950 (72 y.o. M) Primary Care Provider: Nicoletta [name] Other Clinician: Referring Provider: Treating Provider/Extender: [name] in Treatment: [address] 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) I89.0 Lymphedema, not elsewhere classified I10 Essential (primary) hypertension I25.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 angina pectoris Facility Procedures : CPT4 Code: 25366440 Description: 97597 - DEBRIDE WOUND 1ST 20 SQ CM OR < ICD-10 Diagnosis Description 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ed Modifier: Quantity: 1 : CPT4 Code: 34742595 Description: (Facility Use Only) 29581LT - APPLY MULTLAY COMPRS LWR LT LEG Modifier: 59 Quantity: 1 Physician Procedures : CPT4 Code Description Modifier [id number] 99214 - WC PHYS LEVEL 4 - EST PT 25 ICD-10 Diagnosis Description 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 Peri-Wound Care: Sween Lotion (Moisturizing lotion) 1 x Per Week/30 Days Discharge Instructions: Apply moisturizing lotion as directed Prim Dressing: Maxorb Extra Ag+ Alginate Dressing, 2x2 (in/in) 1 x Per Week/30 Days ary Discharge Instructions: Apply to wound bed as instructed Secondary Dressing: Woven Gauze Sponge, Non-Sterile 4x4 in 1 x Per Week/30 Days Discharge Instructions: Apply over primary dressing as directed. Com pression Wrap: ThreePress (3 layer compression wrap) 1 x Per Week/30 Days Discharge Instructions: Apply three layer compression as directed. [name], [name] ([id number]) [id number] Page 14 of 16 04/15/2023: The improvement continues with significantly less drainage, resulting in less excoriation and irritation. The back of his right lower leg is closed, as is the left dorsal foot wound. The open areas on the right medial leg are smaller. I used a curette to debride slough from the right medial leg wound. We will continue topical gentamicin to the open areas, silver alginate to the periwound excoriated skin and negative pressure wound therapy focused on the large open areas. Continue 3 layer compression/equivalent. As he did not bring his juxta lite stockings with him  today, we will wrap his left leg, as well. As he seems to only improve while he is on levofloxacin, I have sent in another prescription for an additional 2 weeks of therapy. He will have a nurse visit early in the week and follow-up with me a week from today. Electronic Signature(s) Signed: 04/15/2023 12:01:31 PM By: [name] Guess MD FACS Previous Signature: 04/15/2023 11:57:33 AM Version By: [name] Guess MD FACS Entered By: [name] on 04/15/2023 09:01:31 -------------------------------------------------------------------------------- HxROS Details Patient Name: Date of Service: [name] 04/15/2023 11:00 A M Medical Record Number: [id number] Patient Account Number: [id number] Date of Birth/Sex: Treating RN: Jan 03, 1951 (72 y.o. M) Primary Care Provider: Nicoletta [name] Other Clinician: Referring Provider: Treating Provider/Extender: [name] in Treatment: 32 Information Obtained From Patient Chart Constitutional Symptoms (General Health) Medical History: Past Medical History Notes: obesity Eyes Medical History: Positive for: Cataracts - right eye removed Negative for: Glaucoma; Optic Neuritis Ear/Nose/Mouth/Throat Medical History: Negative for: Chronic sinus problems/congestion; Middle ear problems Past Medical History Notes: hard of hearing Hematologic/Lymphatic Medical History: Positive for: Anemia - macrocytic; Lymphedema Respiratory Medical History: Past Medical History Notes: frozen diaphragm right Cardiovascular Medical History: Positive for: Congestive Heart Failure; Deep Vein Thrombosis - left leg; Hypertension; Peripheral Venous Disease Past Medical History Notes: hyperlipidemia Endocrine Medical History: Negative for: Type I Diabetes; Type II Diabetes LAVEL, ATLEE ([id number]) [id number] Page 15 of 16 Medical History: Negative for: End Stage Renal Disease Integumentary  (Skin) Medical History: Negative for: History of Burn Musculoskeletal Medical History: Past Medical History Notes: myositis, lumbar DDD, spinal stenosis, cervical facet joint syndrome Oncologic Medical History: Negative for: Received Chemotherapy; Received Radiation Psychiatric Medical History: Negative for: Anorexia/bulimia; Confinement Anxiety HBO Extended History Items Eyes: Cataracts Immunizations Pneumococcal Vaccine: Received Pneumococcal Vaccination: Yes Received Pneumococcal Vaccination On or After 60th Birthday: Yes Implantable Devices No devices added Hospitalization / Surgery History Type of Hospitalization/Surgery cervical fusion lumbar surgery coronary stent placement lasik eye surgery hydrocele excision Family and Social History Cancer: Yes - Mother; Diabetes: No; Heart Disease: Yes - Father,Mother; Hereditary Spherocytosis: No; Hypertension: Yes - Father,Siblings; Kidney Disease: No; Lung Disease: Yes - Siblings; Seizures: No; Stroke: No; Thyroid Problems: No; Tuberculosis: No; Former smoker - quit 1991; Marital Status - Married; Alcohol Use: Moderate; Drug Use: No History; Caffeine Use: Daily - coffee; Financial Concerns: No; Food, Clothing or Shelter Needs: No; Support  prior to dressing change. Peri-Wound Care: Sween Lotion (Moisturizing lotion) 1 x Per Week/30 Days Discharge Instructions: Apply moisturizing lotion as directed Prim Dressing: Maxorb Extra Ag+ Alginate Dressing, 2x2 (in/in) 1 x Per Week/30 Days ary Discharge Instructions: Apply to wound bed as instructed Secondary Dressing: Woven Gauze Sponge, Non-Sterile 4x4 in 1 x Per Week/30 Days Discharge Instructions: Apply over primary dressing as directed. Com pression Wrap: ThreePress (3 layer compression wrap) 1 x Per Week/30 Days Discharge Instructions: Apply three layer compression as directed. [name], [name] ([id number]) [id number] Page 14 of 16 04/15/2023: The improvement continues with significantly less drainage, resulting in less excoriation and irritation. The back of his right lower leg is closed, as is the left dorsal foot wound. The open areas on the right medial leg are smaller. I used a curette to debride slough from the right medial leg wound. We will continue topical gentamicin to the open areas, silver alginate to the periwound excoriated skin and negative pressure wound therapy focused on the large open areas. Continue 3 layer compression/equivalent. As he did not bring his juxta lite stockings with him  today, we will wrap his left leg, as well. As he seems to only improve while he is on levofloxacin, I have sent in another prescription for an additional 2 weeks of therapy. He will have a nurse visit early in the week and follow-up with me a week from today. Electronic Signature(s) Signed: 04/15/2023 12:01:31 PM By: [name] Guess MD FACS Previous Signature: 04/15/2023 11:57:33 AM Version By: [name] Guess MD FACS Entered By: [name] on 04/15/2023 09:01:31 -------------------------------------------------------------------------------- HxROS Details Patient Name: Date of Service: [name] 04/15/2023 11:00 A M Medical Record Number: [id number] Patient Account Number: [id number] Date of Birth/Sex: Treating RN: Jan 03, 1951 (72 y.o. M) Primary Care Provider: Nicoletta [name] Other Clinician: Referring Provider: Treating Provider/Extender: [name] in Treatment: 32 Information Obtained From Patient Chart Constitutional Symptoms (General Health) Medical History: Past Medical History Notes: obesity Eyes Medical History: Positive for: Cataracts - right eye removed Negative for: Glaucoma; Optic Neuritis Ear/Nose/Mouth/Throat Medical History: Negative for: Chronic sinus problems/congestion; Middle ear problems Past Medical History Notes: hard of hearing Hematologic/Lymphatic Medical History: Positive for: Anemia - macrocytic; Lymphedema Respiratory Medical History: Past Medical History Notes: frozen diaphragm right Cardiovascular Medical History: Positive for: Congestive Heart Failure; Deep Vein Thrombosis - left leg; Hypertension; Peripheral Venous Disease Past Medical History Notes: hyperlipidemia Endocrine Medical History: Negative for: Type I Diabetes; Type II Diabetes LAVEL, ATLEE ([id number]) [id number] Page 15 of 16 Medical History: Negative for: End Stage Renal Disease Integumentary  (Skin) Medical History: Negative for: History of Burn Musculoskeletal Medical History: Past Medical History Notes: myositis, lumbar DDD, spinal stenosis, cervical facet joint syndrome Oncologic Medical History: Negative for: Received Chemotherapy; Received Radiation Psychiatric Medical History: Negative for: Anorexia/bulimia; Confinement Anxiety HBO Extended History Items Eyes: Cataracts Immunizations Pneumococcal Vaccine: Received Pneumococcal Vaccination: Yes Received Pneumococcal Vaccination On or After 60th Birthday: Yes Implantable Devices No devices added Hospitalization / Surgery History Type of Hospitalization/Surgery cervical fusion lumbar surgery coronary stent placement lasik eye surgery hydrocele excision Family and Social History Cancer: Yes - Mother; Diabetes: No; Heart Disease: Yes - Father,Mother; Hereditary Spherocytosis: No; Hypertension: Yes - Father,Siblings; Kidney Disease: No; Lung Disease: Yes - Siblings; Seizures: No; Stroke: No; Thyroid Problems: No; Tuberculosis: No; Former smoker - quit 1991; Marital Status - Married; Alcohol Use: Moderate; Drug Use: No History; Caffeine Use: Daily - coffee; Financial Concerns: No; Food, Clothing or Shelter Needs: No; Support  System Lacking: No; Transportation Concerns: No Electronic Signature(s) Signed: 04/15/2023 12:02:24 PM By: [name] Guess MD FACS Entered By: [name] on 04/15/2023 08:55:22 -------------------------------------------------------------------------------- SuperBill Details Patient Name: Date of Service: [name], [name] 04/15/2023 Medical Record Number: [id number] Patient Account Number: [id number] Date of Birth/Sex: Treating RN: Sep 26, 1950 (72 y.o. M) Primary Care Provider: Nicoletta [name] Other Clinician: Referring Provider: Treating Provider/Extender: [name] in Treatment: [address] 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) I89.0 Lymphedema, not elsewhere classified I10 Essential (primary) hypertension I25.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 angina pectoris Facility Procedures : CPT4 Code: 25366440 Description: 97597 - DEBRIDE WOUND 1ST 20 SQ CM OR < ICD-10 Diagnosis Description 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ed Modifier: Quantity: 1 : CPT4 Code: 34742595 Description: (Facility Use Only) 29581LT - APPLY MULTLAY COMPRS LWR LT LEG Modifier: 59 Quantity: 1 Physician Procedures : CPT4 Code Description Modifier [id number] 99214 - WC PHYS LEVEL 4 - EST PT 25 ICD-10 Diagnosis Description 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  A M Medical Record Number: [id number] Patient Account Number: [id number] Date of Birth/Sex: Treating RN: 22-Feb-1951 (72 y.o. 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/09/2022 No Yes I87.2 Venous insufficiency (chronic) (peripheral) 02/09/2022 No Yes I89.0 Lymphedema, not elsewhere classified 02/09/2022 No Yes [name], [name] ([id number]) [id number] Page 8 of 16 I10 Essential (primary) hypertension 02/09/2022 No Yes I25.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 angina pectoris 02/09/2022 No Yes Inactive Problems ICD-10 Code Description Active Date Inactive Date L97.512 Non-pressure chronic ulcer of other part of right foot with fat layer exposed 02/17/2022 02/17/2022 Resolved Problems ICD-10 Code Description Active Date Resolved Date L97.821 Non-pressure chronic ulcer of other part of left lower leg limited to breakdown of skin 03/11/2023 03/11/2023 L98.492 Non-pressure chronic ulcer of skin of other sites with fat layer exposed 03/11/2023 03/11/2023 Electronic Signature(s) Signed: 04/15/2023 11:54:02 AM By: [name] Guess MD FACS Entered By: [name] on 04/15/2023 08:54:02 -------------------------------------------------------------------------------- Progress Note Details Patient Name: Date of Service: [name] 04/15/2023 11:00 A M Medical Record Number: [id number] Patient Account Number: [id number] Date of Birth/Sex: Treating RN: 04-04-1951 (72 y.o. M) Primary Care Provider: Nicoletta [name] Other Clinician: Referring Provider: Treating Provider/Extender: [name] in Treatment: 35 Subjective Chief Complaint Information obtained from Patient Patient seen for complaints of Non-Healing Wounds. History of Present Illness (HPI) ADMISSION 02/09/2022 This is a 72 year old man who recently moved to  [place] from [place]. He has a history of of coronary artery disease and prior left popliteal vein DVT . He is not diabetic and does not smoke. 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 He has had multiple spinal surgeries and has limited mobility. He primarily uses a motorized wheelchair. He has had lymphedema for many years and does have lymphedema pumps although he says he does not use them frequently. He has wounds on his bilateral lower extremities. He was receiving care in [place] for these prior to his move. They were apparently packing his wounds with Betadine soaked gauze. He has worn compression wraps in the past but not recently. He did say that they helped tremendously. In clinic today, his right ABI is noncompressible but has good signals; the left ABI was 1.17. No formal vascular studies have been performed. On his left dorsal foot, there is a circular lesion that exposes the fat layer. There is fairly heavy slough accumulation within the wound, but no significant odor. On his right medial calf, he has multiple pinholes that have slough buildup in them and probe for about 2 to 4 mm in depth. They are draining clear fluid. On his right lateral midfoot, he has ulceration consistent with venous stasis. It is geographic and has slough accumulation. 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 02/17/2022: All of the wounds are little bit larger today and have more slough accumulation. Today, the medial calf openings are larger and probe to something that feels calcified. There is odor coming from his wounds. His  A M Medical Record Number: [id number] Patient Account Number: [id number] Date of Birth/Sex: Treating RN: 22-Feb-1951 (72 y.o. 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/09/2022 No Yes I87.2 Venous insufficiency (chronic) (peripheral) 02/09/2022 No Yes I89.0 Lymphedema, not elsewhere classified 02/09/2022 No Yes [name], [name] ([id number]) [id number] Page 8 of 16 I10 Essential (primary) hypertension 02/09/2022 No Yes I25.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 angina pectoris 02/09/2022 No Yes Inactive Problems ICD-10 Code Description Active Date Inactive Date L97.512 Non-pressure chronic ulcer of other part of right foot with fat layer exposed 02/17/2022 02/17/2022 Resolved Problems ICD-10 Code Description Active Date Resolved Date L97.821 Non-pressure chronic ulcer of other part of left lower leg limited to breakdown of skin 03/11/2023 03/11/2023 L98.492 Non-pressure chronic ulcer of skin of other sites with fat layer exposed 03/11/2023 03/11/2023 Electronic Signature(s) Signed: 04/15/2023 11:54:02 AM By: [name] Guess MD FACS Entered By: [name] on 04/15/2023 08:54:02 -------------------------------------------------------------------------------- Progress Note Details Patient Name: Date of Service: [name] 04/15/2023 11:00 A M Medical Record Number: [id number] Patient Account Number: [id number] Date of Birth/Sex: Treating RN: 04-04-1951 (72 y.o. M) Primary Care Provider: Nicoletta [name] Other Clinician: Referring Provider: Treating Provider/Extender: [name] in Treatment: 35 Subjective Chief Complaint Information obtained from Patient Patient seen for complaints of Non-Healing Wounds. History of Present Illness (HPI) ADMISSION 02/09/2022 This is a 72 year old man who recently moved to  [place] from [place]. He has a history of of coronary artery disease and prior left popliteal vein DVT . He is not diabetic and does not smoke. 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 He has had multiple spinal surgeries and has limited mobility. He primarily uses a motorized wheelchair. He has had lymphedema for many years and does have lymphedema pumps although he says he does not use them frequently. He has wounds on his bilateral lower extremities. He was receiving care in [place] for these prior to his move. They were apparently packing his wounds with Betadine soaked gauze. He has worn compression wraps in the past but not recently. He did say that they helped tremendously. In clinic today, his right ABI is noncompressible but has good signals; the left ABI was 1.17. No formal vascular studies have been performed. On his left dorsal foot, there is a circular lesion that exposes the fat layer. There is fairly heavy slough accumulation within the wound, but no significant odor. On his right medial calf, he has multiple pinholes that have slough buildup in them and probe for about 2 to 4 mm in depth. They are draining clear fluid. On his right lateral midfoot, he has ulceration consistent with venous stasis. It is geographic and has slough accumulation. 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 02/17/2022: All of the wounds are little bit larger today and have more slough accumulation. Today, the medial calf openings are larger and probe to something that feels calcified. There is odor coming from his wounds. His  A M Medical Record Number: [id number] Patient Account Number: [id number] Date of Birth/Sex: Treating RN: 22-Feb-1951 (72 y.o. 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/09/2022 No Yes I87.2 Venous insufficiency (chronic) (peripheral) 02/09/2022 No Yes I89.0 Lymphedema, not elsewhere classified 02/09/2022 No Yes [name], [name] ([id number]) [id number] Page 8 of 16 I10 Essential (primary) hypertension 02/09/2022 No Yes I25.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 angina pectoris 02/09/2022 No Yes Inactive Problems ICD-10 Code Description Active Date Inactive Date L97.512 Non-pressure chronic ulcer of other part of right foot with fat layer exposed 02/17/2022 02/17/2022 Resolved Problems ICD-10 Code Description Active Date Resolved Date L97.821 Non-pressure chronic ulcer of other part of left lower leg limited to breakdown of skin 03/11/2023 03/11/2023 L98.492 Non-pressure chronic ulcer of skin of other sites with fat layer exposed 03/11/2023 03/11/2023 Electronic Signature(s) Signed: 04/15/2023 11:54:02 AM By: [name] Guess MD FACS Entered By: [name] on 04/15/2023 08:54:02 -------------------------------------------------------------------------------- Progress Note Details Patient Name: Date of Service: [name] 04/15/2023 11:00 A M Medical Record Number: [id number] Patient Account Number: [id number] Date of Birth/Sex: Treating RN: 04-04-1951 (72 y.o. M) Primary Care Provider: Nicoletta [name] Other Clinician: Referring Provider: Treating Provider/Extender: [name] in Treatment: 35 Subjective Chief Complaint Information obtained from Patient Patient seen for complaints of Non-Healing Wounds. History of Present Illness (HPI) ADMISSION 02/09/2022 This is a 72 year old man who recently moved to  [place] from [place]. He has a history of of coronary artery disease and prior left popliteal vein DVT . He is not diabetic and does not smoke. 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 He has had multiple spinal surgeries and has limited mobility. He primarily uses a motorized wheelchair. He has had lymphedema for many years and does have lymphedema pumps although he says he does not use them frequently. He has wounds on his bilateral lower extremities. He was receiving care in [place] for these prior to his move. They were apparently packing his wounds with Betadine soaked gauze. He has worn compression wraps in the past but not recently. He did say that they helped tremendously. In clinic today, his right ABI is noncompressible but has good signals; the left ABI was 1.17. No formal vascular studies have been performed. On his left dorsal foot, there is a circular lesion that exposes the fat layer. There is fairly heavy slough accumulation within the wound, but no significant odor. On his right medial calf, he has multiple pinholes that have slough buildup in them and probe for about 2 to 4 mm in depth. They are draining clear fluid. On his right lateral midfoot, he has ulceration consistent with venous stasis. It is geographic and has slough accumulation. 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 02/17/2022: All of the wounds are little bit larger today and have more slough accumulation. Today, the medial calf openings are larger and probe to something that feels calcified. There is odor coming from his wounds. His  prior to dressing change. Peri-Wound Care: Sween Lotion (Moisturizing lotion) 1 x Per Week/30 Days Discharge Instructions: Apply moisturizing lotion as directed Prim Dressing: Maxorb Extra Ag+ Alginate Dressing, 2x2 (in/in) 1 x Per Week/30 Days ary Discharge Instructions: Apply to wound bed as instructed Secondary Dressing: Woven Gauze Sponge, Non-Sterile 4x4 in 1 x Per Week/30 Days Discharge Instructions: Apply over primary dressing as directed. Com pression Wrap: ThreePress (3 layer compression wrap) 1 x Per Week/30 Days Discharge Instructions: Apply three layer compression as directed. [name], [name] ([id number]) [id number] Page 14 of 16 04/15/2023: The improvement continues with significantly less drainage, resulting in less excoriation and irritation. The back of his right lower leg is closed, as is the left dorsal foot wound. The open areas on the right medial leg are smaller. I used a curette to debride slough from the right medial leg wound. We will continue topical gentamicin to the open areas, silver alginate to the periwound excoriated skin and negative pressure wound therapy focused on the large open areas. Continue 3 layer compression/equivalent. As he did not bring his juxta lite stockings with him  today, we will wrap his left leg, as well. As he seems to only improve while he is on levofloxacin, I have sent in another prescription for an additional 2 weeks of therapy. He will have a nurse visit early in the week and follow-up with me a week from today. Electronic Signature(s) Signed: 04/15/2023 12:01:31 PM By: [name] Guess MD FACS Previous Signature: 04/15/2023 11:57:33 AM Version By: [name] Guess MD FACS Entered By: [name] on 04/15/2023 09:01:31 -------------------------------------------------------------------------------- HxROS Details Patient Name: Date of Service: [name] 04/15/2023 11:00 A M Medical Record Number: [id number] Patient Account Number: [id number] Date of Birth/Sex: Treating RN: Jan 03, 1951 (72 y.o. M) Primary Care Provider: Nicoletta [name] Other Clinician: Referring Provider: Treating Provider/Extender: [name] in Treatment: 32 Information Obtained From Patient Chart Constitutional Symptoms (General Health) Medical History: Past Medical History Notes: obesity Eyes Medical History: Positive for: Cataracts - right eye removed Negative for: Glaucoma; Optic Neuritis Ear/Nose/Mouth/Throat Medical History: Negative for: Chronic sinus problems/congestion; Middle ear problems Past Medical History Notes: hard of hearing Hematologic/Lymphatic Medical History: Positive for: Anemia - macrocytic; Lymphedema Respiratory Medical History: Past Medical History Notes: frozen diaphragm right Cardiovascular Medical History: Positive for: Congestive Heart Failure; Deep Vein Thrombosis - left leg; Hypertension; Peripheral Venous Disease Past Medical History Notes: hyperlipidemia Endocrine Medical History: Negative for: Type I Diabetes; Type II Diabetes LAVEL, ATLEE ([id number]) [id number] Page 15 of 16 Medical History: Negative for: End Stage Renal Disease Integumentary  (Skin) Medical History: Negative for: History of Burn Musculoskeletal Medical History: Past Medical History Notes: myositis, lumbar DDD, spinal stenosis, cervical facet joint syndrome Oncologic Medical History: Negative for: Received Chemotherapy; Received Radiation Psychiatric Medical History: Negative for: Anorexia/bulimia; Confinement Anxiety HBO Extended History Items Eyes: Cataracts Immunizations Pneumococcal Vaccine: Received Pneumococcal Vaccination: Yes Received Pneumococcal Vaccination On or After 60th Birthday: Yes Implantable Devices No devices added Hospitalization / Surgery History Type of Hospitalization/Surgery cervical fusion lumbar surgery coronary stent placement lasik eye surgery hydrocele excision Family and Social History Cancer: Yes - Mother; Diabetes: No; Heart Disease: Yes - Father,Mother; Hereditary Spherocytosis: No; Hypertension: Yes - Father,Siblings; Kidney Disease: No; Lung Disease: Yes - Siblings; Seizures: No; Stroke: No; Thyroid Problems: No; Tuberculosis: No; Former smoker - quit 1991; Marital Status - Married; Alcohol Use: Moderate; Drug Use: No History; Caffeine Use: Daily - coffee; Financial Concerns: No; Food, Clothing or Shelter Needs: No; Support  prior to dressing change. Peri-Wound Care: Sween Lotion (Moisturizing lotion) 1 x Per Week/30 Days Discharge Instructions: Apply moisturizing lotion as directed Prim Dressing: Maxorb Extra Ag+ Alginate Dressing, 2x2 (in/in) 1 x Per Week/30 Days ary Discharge Instructions: Apply to wound bed as instructed Secondary Dressing: Woven Gauze Sponge, Non-Sterile 4x4 in 1 x Per Week/30 Days Discharge Instructions: Apply over primary dressing as directed. Com pression Wrap: ThreePress (3 layer compression wrap) 1 x Per Week/30 Days Discharge Instructions: Apply three layer compression as directed. [name], [name] ([id number]) [id number] Page 14 of 16 04/15/2023: The improvement continues with significantly less drainage, resulting in less excoriation and irritation. The back of his right lower leg is closed, as is the left dorsal foot wound. The open areas on the right medial leg are smaller. I used a curette to debride slough from the right medial leg wound. We will continue topical gentamicin to the open areas, silver alginate to the periwound excoriated skin and negative pressure wound therapy focused on the large open areas. Continue 3 layer compression/equivalent. As he did not bring his juxta lite stockings with him  today, we will wrap his left leg, as well. As he seems to only improve while he is on levofloxacin, I have sent in another prescription for an additional 2 weeks of therapy. He will have a nurse visit early in the week and follow-up with me a week from today. Electronic Signature(s) Signed: 04/15/2023 12:01:31 PM By: [name] Guess MD FACS Previous Signature: 04/15/2023 11:57:33 AM Version By: [name] Guess MD FACS Entered By: [name] on 04/15/2023 09:01:31 -------------------------------------------------------------------------------- HxROS Details Patient Name: Date of Service: [name] 04/15/2023 11:00 A M Medical Record Number: [id number] Patient Account Number: [id number] Date of Birth/Sex: Treating RN: Jan 03, 1951 (72 y.o. M) Primary Care Provider: Nicoletta [name] Other Clinician: Referring Provider: Treating Provider/Extender: [name] in Treatment: 32 Information Obtained From Patient Chart Constitutional Symptoms (General Health) Medical History: Past Medical History Notes: obesity Eyes Medical History: Positive for: Cataracts - right eye removed Negative for: Glaucoma; Optic Neuritis Ear/Nose/Mouth/Throat Medical History: Negative for: Chronic sinus problems/congestion; Middle ear problems Past Medical History Notes: hard of hearing Hematologic/Lymphatic Medical History: Positive for: Anemia - macrocytic; Lymphedema Respiratory Medical History: Past Medical History Notes: frozen diaphragm right Cardiovascular Medical History: Positive for: Congestive Heart Failure; Deep Vein Thrombosis - left leg; Hypertension; Peripheral Venous Disease Past Medical History Notes: hyperlipidemia Endocrine Medical History: Negative for: Type I Diabetes; Type II Diabetes LAVEL, ATLEE ([id number]) [id number] Page 15 of 16 Medical History: Negative for: End Stage Renal Disease Integumentary  (Skin) Medical History: Negative for: History of Burn Musculoskeletal Medical History: Past Medical History Notes: myositis, lumbar DDD, spinal stenosis, cervical facet joint syndrome Oncologic Medical History: Negative for: Received Chemotherapy; Received Radiation Psychiatric Medical History: Negative for: Anorexia/bulimia; Confinement Anxiety HBO Extended History Items Eyes: Cataracts Immunizations Pneumococcal Vaccine: Received Pneumococcal Vaccination: Yes Received Pneumococcal Vaccination On or After 60th Birthday: Yes Implantable Devices No devices added Hospitalization / Surgery History Type of Hospitalization/Surgery cervical fusion lumbar surgery coronary stent placement lasik eye surgery hydrocele excision Family and Social History Cancer: Yes - Mother; Diabetes: No; Heart Disease: Yes - Father,Mother; Hereditary Spherocytosis: No; Hypertension: Yes - Father,Siblings; Kidney Disease: No; Lung Disease: Yes - Siblings; Seizures: No; Stroke: No; Thyroid Problems: No; Tuberculosis: No; Former smoker - quit 1991; Marital Status - Married; Alcohol Use: Moderate; Drug Use: No History; Caffeine Use: Daily - coffee; Financial Concerns: No; Food, Clothing or Shelter Needs: No; Support  prior to dressing change. Peri-Wound Care: Sween Lotion (Moisturizing lotion) 1 x Per Week/30 Days Discharge Instructions: Apply moisturizing lotion as directed Prim Dressing: Maxorb Extra Ag+ Alginate Dressing, 2x2 (in/in) 1 x Per Week/30 Days ary Discharge Instructions: Apply to wound bed as instructed Secondary Dressing: Woven Gauze Sponge, Non-Sterile 4x4 in 1 x Per Week/30 Days Discharge Instructions: Apply over primary dressing as directed. Com pression Wrap: ThreePress (3 layer compression wrap) 1 x Per Week/30 Days Discharge Instructions: Apply three layer compression as directed. [name], [name] ([id number]) [id number] Page 14 of 16 04/15/2023: The improvement continues with significantly less drainage, resulting in less excoriation and irritation. The back of his right lower leg is closed, as is the left dorsal foot wound. The open areas on the right medial leg are smaller. I used a curette to debride slough from the right medial leg wound. We will continue topical gentamicin to the open areas, silver alginate to the periwound excoriated skin and negative pressure wound therapy focused on the large open areas. Continue 3 layer compression/equivalent. As he did not bring his juxta lite stockings with him  today, we will wrap his left leg, as well. As he seems to only improve while he is on levofloxacin, I have sent in another prescription for an additional 2 weeks of therapy. He will have a nurse visit early in the week and follow-up with me a week from today. Electronic Signature(s) Signed: 04/15/2023 12:01:31 PM By: [name] Guess MD FACS Previous Signature: 04/15/2023 11:57:33 AM Version By: [name] Guess MD FACS Entered By: [name] on 04/15/2023 09:01:31 -------------------------------------------------------------------------------- HxROS Details Patient Name: Date of Service: [name] 04/15/2023 11:00 A M Medical Record Number: [id number] Patient Account Number: [id number] Date of Birth/Sex: Treating RN: Jan 03, 1951 (72 y.o. M) Primary Care Provider: Nicoletta [name] Other Clinician: Referring Provider: Treating Provider/Extender: [name] in Treatment: 32 Information Obtained From Patient Chart Constitutional Symptoms (General Health) Medical History: Past Medical History Notes: obesity Eyes Medical History: Positive for: Cataracts - right eye removed Negative for: Glaucoma; Optic Neuritis Ear/Nose/Mouth/Throat Medical History: Negative for: Chronic sinus problems/congestion; Middle ear problems Past Medical History Notes: hard of hearing Hematologic/Lymphatic Medical History: Positive for: Anemia - macrocytic; Lymphedema Respiratory Medical History: Past Medical History Notes: frozen diaphragm right Cardiovascular Medical History: Positive for: Congestive Heart Failure; Deep Vein Thrombosis - left leg; Hypertension; Peripheral Venous Disease Past Medical History Notes: hyperlipidemia Endocrine Medical History: Negative for: Type I Diabetes; Type II Diabetes LAVEL, ATLEE ([id number]) [id number] Page 15 of 16 Medical History: Negative for: End Stage Renal Disease Integumentary  (Skin) Medical History: Negative for: History of Burn Musculoskeletal Medical History: Past Medical History Notes: myositis, lumbar DDD, spinal stenosis, cervical facet joint syndrome Oncologic Medical History: Negative for: Received Chemotherapy; Received Radiation Psychiatric Medical History: Negative for: Anorexia/bulimia; Confinement Anxiety HBO Extended History Items Eyes: Cataracts Immunizations Pneumococcal Vaccine: Received Pneumococcal Vaccination: Yes Received Pneumococcal Vaccination On or After 60th Birthday: Yes Implantable Devices No devices added Hospitalization / Surgery History Type of Hospitalization/Surgery cervical fusion lumbar surgery coronary stent placement lasik eye surgery hydrocele excision Family and Social History Cancer: Yes - Mother; Diabetes: No; Heart Disease: Yes - Father,Mother; Hereditary Spherocytosis: No; Hypertension: Yes - Father,Siblings; Kidney Disease: No; Lung Disease: Yes - Siblings; Seizures: No; Stroke: No; Thyroid Problems: No; Tuberculosis: No; Former smoker - quit 1991; Marital Status - Married; Alcohol Use: Moderate; Drug Use: No History; Caffeine Use: Daily - coffee; Financial Concerns: No; Food, Clothing or Shelter Needs: No; Support  prior to dressing change. Peri-Wound Care: Sween Lotion (Moisturizing lotion) 1 x Per Week/30 Days Discharge Instructions: Apply moisturizing lotion as directed Prim Dressing: Maxorb Extra Ag+ Alginate Dressing, 2x2 (in/in) 1 x Per Week/30 Days ary Discharge Instructions: Apply to wound bed as instructed Secondary Dressing: Woven Gauze Sponge, Non-Sterile 4x4 in 1 x Per Week/30 Days Discharge Instructions: Apply over primary dressing as directed. Com pression Wrap: ThreePress (3 layer compression wrap) 1 x Per Week/30 Days Discharge Instructions: Apply three layer compression as directed. [name], [name] ([id number]) [id number] Page 14 of 16 04/15/2023: The improvement continues with significantly less drainage, resulting in less excoriation and irritation. The back of his right lower leg is closed, as is the left dorsal foot wound. The open areas on the right medial leg are smaller. I used a curette to debride slough from the right medial leg wound. We will continue topical gentamicin to the open areas, silver alginate to the periwound excoriated skin and negative pressure wound therapy focused on the large open areas. Continue 3 layer compression/equivalent. As he did not bring his juxta lite stockings with him  today, we will wrap his left leg, as well. As he seems to only improve while he is on levofloxacin, I have sent in another prescription for an additional 2 weeks of therapy. He will have a nurse visit early in the week and follow-up with me a week from today. Electronic Signature(s) Signed: 04/15/2023 12:01:31 PM By: [name] Guess MD FACS Previous Signature: 04/15/2023 11:57:33 AM Version By: [name] Guess MD FACS Entered By: [name] on 04/15/2023 09:01:31 -------------------------------------------------------------------------------- HxROS Details Patient Name: Date of Service: [name] 04/15/2023 11:00 A M Medical Record Number: [id number] Patient Account Number: [id number] Date of Birth/Sex: Treating RN: Jan 03, 1951 (72 y.o. M) Primary Care Provider: Nicoletta [name] Other Clinician: Referring Provider: Treating Provider/Extender: [name] in Treatment: 32 Information Obtained From Patient Chart Constitutional Symptoms (General Health) Medical History: Past Medical History Notes: obesity Eyes Medical History: Positive for: Cataracts - right eye removed Negative for: Glaucoma; Optic Neuritis Ear/Nose/Mouth/Throat Medical History: Negative for: Chronic sinus problems/congestion; Middle ear problems Past Medical History Notes: hard of hearing Hematologic/Lymphatic Medical History: Positive for: Anemia - macrocytic; Lymphedema Respiratory Medical History: Past Medical History Notes: frozen diaphragm right Cardiovascular Medical History: Positive for: Congestive Heart Failure; Deep Vein Thrombosis - left leg; Hypertension; Peripheral Venous Disease Past Medical History Notes: hyperlipidemia Endocrine Medical History: Negative for: Type I Diabetes; Type II Diabetes LAVEL, ATLEE ([id number]) [id number] Page 15 of 16 Medical History: Negative for: End Stage Renal Disease Integumentary  (Skin) Medical History: Negative for: History of Burn Musculoskeletal Medical History: Past Medical History Notes: myositis, lumbar DDD, spinal stenosis, cervical facet joint syndrome Oncologic Medical History: Negative for: Received Chemotherapy; Received Radiation Psychiatric Medical History: Negative for: Anorexia/bulimia; Confinement Anxiety HBO Extended History Items Eyes: Cataracts Immunizations Pneumococcal Vaccine: Received Pneumococcal Vaccination: Yes Received Pneumococcal Vaccination On or After 60th Birthday: Yes Implantable Devices No devices added Hospitalization / Surgery History Type of Hospitalization/Surgery cervical fusion lumbar surgery coronary stent placement lasik eye surgery hydrocele excision Family and Social History Cancer: Yes - Mother; Diabetes: No; Heart Disease: Yes - Father,Mother; Hereditary Spherocytosis: No; Hypertension: Yes - Father,Siblings; Kidney Disease: No; Lung Disease: Yes - Siblings; Seizures: No; Stroke: No; Thyroid Problems: No; Tuberculosis: No; Former smoker - quit 1991; Marital Status - Married; Alcohol Use: Moderate; Drug Use: No History; Caffeine Use: Daily - coffee; Financial Concerns: No; Food, Clothing or Shelter Needs: No; Support  prior to dressing change. Peri-Wound Care: Sween Lotion (Moisturizing lotion) 1 x Per Week/30 Days Discharge Instructions: Apply moisturizing lotion as directed Prim Dressing: Maxorb Extra Ag+ Alginate Dressing, 2x2 (in/in) 1 x Per Week/30 Days ary Discharge Instructions: Apply to wound bed as instructed Secondary Dressing: Woven Gauze Sponge, Non-Sterile 4x4 in 1 x Per Week/30 Days Discharge Instructions: Apply over primary dressing as directed. Com pression Wrap: ThreePress (3 layer compression wrap) 1 x Per Week/30 Days Discharge Instructions: Apply three layer compression as directed. [name], [name] ([id number]) [id number] Page 14 of 16 04/15/2023: The improvement continues with significantly less drainage, resulting in less excoriation and irritation. The back of his right lower leg is closed, as is the left dorsal foot wound. The open areas on the right medial leg are smaller. I used a curette to debride slough from the right medial leg wound. We will continue topical gentamicin to the open areas, silver alginate to the periwound excoriated skin and negative pressure wound therapy focused on the large open areas. Continue 3 layer compression/equivalent. As he did not bring his juxta lite stockings with him  today, we will wrap his left leg, as well. As he seems to only improve while he is on levofloxacin, I have sent in another prescription for an additional 2 weeks of therapy. He will have a nurse visit early in the week and follow-up with me a week from today. Electronic Signature(s) Signed: 04/15/2023 12:01:31 PM By: [name] Guess MD FACS Previous Signature: 04/15/2023 11:57:33 AM Version By: [name] Guess MD FACS Entered By: [name] on 04/15/2023 09:01:31 -------------------------------------------------------------------------------- HxROS Details Patient Name: Date of Service: [name] 04/15/2023 11:00 A M Medical Record Number: [id number] Patient Account Number: [id number] Date of Birth/Sex: Treating RN: Jan 03, 1951 (72 y.o. M) Primary Care Provider: Nicoletta [name] Other Clinician: Referring Provider: Treating Provider/Extender: [name] in Treatment: 32 Information Obtained From Patient Chart Constitutional Symptoms (General Health) Medical History: Past Medical History Notes: obesity Eyes Medical History: Positive for: Cataracts - right eye removed Negative for: Glaucoma; Optic Neuritis Ear/Nose/Mouth/Throat Medical History: Negative for: Chronic sinus problems/congestion; Middle ear problems Past Medical History Notes: hard of hearing Hematologic/Lymphatic Medical History: Positive for: Anemia - macrocytic; Lymphedema Respiratory Medical History: Past Medical History Notes: frozen diaphragm right Cardiovascular Medical History: Positive for: Congestive Heart Failure; Deep Vein Thrombosis - left leg; Hypertension; Peripheral Venous Disease Past Medical History Notes: hyperlipidemia Endocrine Medical History: Negative for: Type I Diabetes; Type II Diabetes LAVEL, ATLEE ([id number]) [id number] Page 15 of 16 Medical History: Negative for: End Stage Renal Disease Integumentary  (Skin) Medical History: Negative for: History of Burn Musculoskeletal Medical History: Past Medical History Notes: myositis, lumbar DDD, spinal stenosis, cervical facet joint syndrome Oncologic Medical History: Negative for: Received Chemotherapy; Received Radiation Psychiatric Medical History: Negative for: Anorexia/bulimia; Confinement Anxiety HBO Extended History Items Eyes: Cataracts Immunizations Pneumococcal Vaccine: Received Pneumococcal Vaccination: Yes Received Pneumococcal Vaccination On or After 60th Birthday: Yes Implantable Devices No devices added Hospitalization / Surgery History Type of Hospitalization/Surgery cervical fusion lumbar surgery coronary stent placement lasik eye surgery hydrocele excision Family and Social History Cancer: Yes - Mother; Diabetes: No; Heart Disease: Yes - Father,Mother; Hereditary Spherocytosis: No; Hypertension: Yes - Father,Siblings; Kidney Disease: No; Lung Disease: Yes - Siblings; Seizures: No; Stroke: No; Thyroid Problems: No; Tuberculosis: No; Former smoker - quit 1991; Marital Status - Married; Alcohol Use: Moderate; Drug Use: No History; Caffeine Use: Daily - coffee; Financial Concerns: No; Food, Clothing or Shelter Needs: No; Support  prior to dressing change. Peri-Wound Care: Sween Lotion (Moisturizing lotion) 1 x Per Week/30 Days Discharge Instructions: Apply moisturizing lotion as directed Prim Dressing: Maxorb Extra Ag+ Alginate Dressing, 2x2 (in/in) 1 x Per Week/30 Days ary Discharge Instructions: Apply to wound bed as instructed Secondary Dressing: Woven Gauze Sponge, Non-Sterile 4x4 in 1 x Per Week/30 Days Discharge Instructions: Apply over primary dressing as directed. Com pression Wrap: ThreePress (3 layer compression wrap) 1 x Per Week/30 Days Discharge Instructions: Apply three layer compression as directed. [name], [name] ([id number]) [id number] Page 14 of 16 04/15/2023: The improvement continues with significantly less drainage, resulting in less excoriation and irritation. The back of his right lower leg is closed, as is the left dorsal foot wound. The open areas on the right medial leg are smaller. I used a curette to debride slough from the right medial leg wound. We will continue topical gentamicin to the open areas, silver alginate to the periwound excoriated skin and negative pressure wound therapy focused on the large open areas. Continue 3 layer compression/equivalent. As he did not bring his juxta lite stockings with him  today, we will wrap his left leg, as well. As he seems to only improve while he is on levofloxacin, I have sent in another prescription for an additional 2 weeks of therapy. He will have a nurse visit early in the week and follow-up with me a week from today. Electronic Signature(s) Signed: 04/15/2023 12:01:31 PM By: [name] Guess MD FACS Previous Signature: 04/15/2023 11:57:33 AM Version By: [name] Guess MD FACS Entered By: [name] on 04/15/2023 09:01:31 -------------------------------------------------------------------------------- HxROS Details Patient Name: Date of Service: [name] 04/15/2023 11:00 A M Medical Record Number: [id number] Patient Account Number: [id number] Date of Birth/Sex: Treating RN: Jan 03, 1951 (72 y.o. M) Primary Care Provider: Nicoletta [name] Other Clinician: Referring Provider: Treating Provider/Extender: [name] in Treatment: 32 Information Obtained From Patient Chart Constitutional Symptoms (General Health) Medical History: Past Medical History Notes: obesity Eyes Medical History: Positive for: Cataracts - right eye removed Negative for: Glaucoma; Optic Neuritis Ear/Nose/Mouth/Throat Medical History: Negative for: Chronic sinus problems/congestion; Middle ear problems Past Medical History Notes: hard of hearing Hematologic/Lymphatic Medical History: Positive for: Anemia - macrocytic; Lymphedema Respiratory Medical History: Past Medical History Notes: frozen diaphragm right Cardiovascular Medical History: Positive for: Congestive Heart Failure; Deep Vein Thrombosis - left leg; Hypertension; Peripheral Venous Disease Past Medical History Notes: hyperlipidemia Endocrine Medical History: Negative for: Type I Diabetes; Type II Diabetes LAVEL, ATLEE ([id number]) [id number] Page 15 of 16 Medical History: Negative for: End Stage Renal Disease Integumentary  (Skin) Medical History: Negative for: History of Burn Musculoskeletal Medical History: Past Medical History Notes: myositis, lumbar DDD, spinal stenosis, cervical facet joint syndrome Oncologic Medical History: Negative for: Received Chemotherapy; Received Radiation Psychiatric Medical History: Negative for: Anorexia/bulimia; Confinement Anxiety HBO Extended History Items Eyes: Cataracts Immunizations Pneumococcal Vaccine: Received Pneumococcal Vaccination: Yes Received Pneumococcal Vaccination On or After 60th Birthday: Yes Implantable Devices No devices added Hospitalization / Surgery History Type of Hospitalization/Surgery cervical fusion lumbar surgery coronary stent placement lasik eye surgery hydrocele excision Family and Social History Cancer: Yes - Mother; Diabetes: No; Heart Disease: Yes - Father,Mother; Hereditary Spherocytosis: No; Hypertension: Yes - Father,Siblings; Kidney Disease: No; Lung Disease: Yes - Siblings; Seizures: No; Stroke: No; Thyroid Problems: No; Tuberculosis: No; Former smoker - quit 1991; Marital Status - Married; Alcohol Use: Moderate; Drug Use: No History; Caffeine Use: Daily - coffee; Financial Concerns: No; Food, Clothing or Shelter Needs: No; Support  prior to dressing change. Peri-Wound Care: Sween Lotion (Moisturizing lotion) 1 x Per Week/30 Days Discharge Instructions: Apply moisturizing lotion as directed Prim Dressing: Maxorb Extra Ag+ Alginate Dressing, 2x2 (in/in) 1 x Per Week/30 Days ary Discharge Instructions: Apply to wound bed as instructed Secondary Dressing: Woven Gauze Sponge, Non-Sterile 4x4 in 1 x Per Week/30 Days Discharge Instructions: Apply over primary dressing as directed. Com pression Wrap: ThreePress (3 layer compression wrap) 1 x Per Week/30 Days Discharge Instructions: Apply three layer compression as directed. [name], [name] ([id number]) [id number] Page 14 of 16 04/15/2023: The improvement continues with significantly less drainage, resulting in less excoriation and irritation. The back of his right lower leg is closed, as is the left dorsal foot wound. The open areas on the right medial leg are smaller. I used a curette to debride slough from the right medial leg wound. We will continue topical gentamicin to the open areas, silver alginate to the periwound excoriated skin and negative pressure wound therapy focused on the large open areas. Continue 3 layer compression/equivalent. As he did not bring his juxta lite stockings with him  today, we will wrap his left leg, as well. As he seems to only improve while he is on levofloxacin, I have sent in another prescription for an additional 2 weeks of therapy. He will have a nurse visit early in the week and follow-up with me a week from today. Electronic Signature(s) Signed: 04/15/2023 12:01:31 PM By: [name] Guess MD FACS Previous Signature: 04/15/2023 11:57:33 AM Version By: [name] Guess MD FACS Entered By: [name] on 04/15/2023 09:01:31 -------------------------------------------------------------------------------- HxROS Details Patient Name: Date of Service: [name] 04/15/2023 11:00 A M Medical Record Number: [id number] Patient Account Number: [id number] Date of Birth/Sex: Treating RN: Jan 03, 1951 (72 y.o. M) Primary Care Provider: Nicoletta [name] Other Clinician: Referring Provider: Treating Provider/Extender: [name] in Treatment: 32 Information Obtained From Patient Chart Constitutional Symptoms (General Health) Medical History: Past Medical History Notes: obesity Eyes Medical History: Positive for: Cataracts - right eye removed Negative for: Glaucoma; Optic Neuritis Ear/Nose/Mouth/Throat Medical History: Negative for: Chronic sinus problems/congestion; Middle ear problems Past Medical History Notes: hard of hearing Hematologic/Lymphatic Medical History: Positive for: Anemia - macrocytic; Lymphedema Respiratory Medical History: Past Medical History Notes: frozen diaphragm right Cardiovascular Medical History: Positive for: Congestive Heart Failure; Deep Vein Thrombosis - left leg; Hypertension; Peripheral Venous Disease Past Medical History Notes: hyperlipidemia Endocrine Medical History: Negative for: Type I Diabetes; Type II Diabetes LAVEL, ATLEE ([id number]) [id number] Page 15 of 16 Medical History: Negative for: End Stage Renal Disease Integumentary  (Skin) Medical History: Negative for: History of Burn Musculoskeletal Medical History: Past Medical History Notes: myositis, lumbar DDD, spinal stenosis, cervical facet joint syndrome Oncologic Medical History: Negative for: Received Chemotherapy; Received Radiation Psychiatric Medical History: Negative for: Anorexia/bulimia; Confinement Anxiety HBO Extended History Items Eyes: Cataracts Immunizations Pneumococcal Vaccine: Received Pneumococcal Vaccination: Yes Received Pneumococcal Vaccination On or After 60th Birthday: Yes Implantable Devices No devices added Hospitalization / Surgery History Type of Hospitalization/Surgery cervical fusion lumbar surgery coronary stent placement lasik eye surgery hydrocele excision Family and Social History Cancer: Yes - Mother; Diabetes: No; Heart Disease: Yes - Father,Mother; Hereditary Spherocytosis: No; Hypertension: Yes - Father,Siblings; Kidney Disease: No; Lung Disease: Yes - Siblings; Seizures: No; Stroke: No; Thyroid Problems: No; Tuberculosis: No; Former smoker - quit 1991; Marital Status - Married; Alcohol Use: Moderate; Drug Use: No History; Caffeine Use: Daily - coffee; Financial Concerns: No; Food, Clothing or Shelter Needs: No; Support  A M Medical Record Number: [id number] Patient Account Number: [id number] Date of Birth/Sex: Treating RN: 22-Feb-1951 (72 y.o. 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/09/2022 No Yes I87.2 Venous insufficiency (chronic) (peripheral) 02/09/2022 No Yes I89.0 Lymphedema, not elsewhere classified 02/09/2022 No Yes [name], [name] ([id number]) [id number] Page 8 of 16 I10 Essential (primary) hypertension 02/09/2022 No Yes I25.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 angina pectoris 02/09/2022 No Yes Inactive Problems ICD-10 Code Description Active Date Inactive Date L97.512 Non-pressure chronic ulcer of other part of right foot with fat layer exposed 02/17/2022 02/17/2022 Resolved Problems ICD-10 Code Description Active Date Resolved Date L97.821 Non-pressure chronic ulcer of other part of left lower leg limited to breakdown of skin 03/11/2023 03/11/2023 L98.492 Non-pressure chronic ulcer of skin of other sites with fat layer exposed 03/11/2023 03/11/2023 Electronic Signature(s) Signed: 04/15/2023 11:54:02 AM By: [name] Guess MD FACS Entered By: [name] on 04/15/2023 08:54:02 -------------------------------------------------------------------------------- Progress Note Details Patient Name: Date of Service: [name] 04/15/2023 11:00 A M Medical Record Number: [id number] Patient Account Number: [id number] Date of Birth/Sex: Treating RN: 04-04-1951 (72 y.o. M) Primary Care Provider: Nicoletta [name] Other Clinician: Referring Provider: Treating Provider/Extender: [name] in Treatment: 35 Subjective Chief Complaint Information obtained from Patient Patient seen for complaints of Non-Healing Wounds. History of Present Illness (HPI) ADMISSION 02/09/2022 This is a 72 year old man who recently moved to  [place] from [place]. He has a history of of coronary artery disease and prior left popliteal vein DVT . He is not diabetic and does not smoke. 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 He has had multiple spinal surgeries and has limited mobility. He primarily uses a motorized wheelchair. He has had lymphedema for many years and does have lymphedema pumps although he says he does not use them frequently. He has wounds on his bilateral lower extremities. He was receiving care in [place] for these prior to his move. They were apparently packing his wounds with Betadine soaked gauze. He has worn compression wraps in the past but not recently. He did say that they helped tremendously. In clinic today, his right ABI is noncompressible but has good signals; the left ABI was 1.17. No formal vascular studies have been performed. On his left dorsal foot, there is a circular lesion that exposes the fat layer. There is fairly heavy slough accumulation within the wound, but no significant odor. On his right medial calf, he has multiple pinholes that have slough buildup in them and probe for about 2 to 4 mm in depth. They are draining clear fluid. On his right lateral midfoot, he has ulceration consistent with venous stasis. It is geographic and has slough accumulation. 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 02/17/2022: All of the wounds are little bit larger today and have more slough accumulation. Today, the medial calf openings are larger and probe to something that feels calcified. There is odor coming from his wounds. His  prior to dressing change. Peri-Wound Care: Sween Lotion (Moisturizing lotion) 1 x Per Week/30 Days Discharge Instructions: Apply moisturizing lotion as directed Prim Dressing: Maxorb Extra Ag+ Alginate Dressing, 2x2 (in/in) 1 x Per Week/30 Days ary Discharge Instructions: Apply to wound bed as instructed Secondary Dressing: Woven Gauze Sponge, Non-Sterile 4x4 in 1 x Per Week/30 Days Discharge Instructions: Apply over primary dressing as directed. Com pression Wrap: ThreePress (3 layer compression wrap) 1 x Per Week/30 Days Discharge Instructions: Apply three layer compression as directed. [name], [name] ([id number]) [id number] Page 14 of 16 04/15/2023: The improvement continues with significantly less drainage, resulting in less excoriation and irritation. The back of his right lower leg is closed, as is the left dorsal foot wound. The open areas on the right medial leg are smaller. I used a curette to debride slough from the right medial leg wound. We will continue topical gentamicin to the open areas, silver alginate to the periwound excoriated skin and negative pressure wound therapy focused on the large open areas. Continue 3 layer compression/equivalent. As he did not bring his juxta lite stockings with him  today, we will wrap his left leg, as well. As he seems to only improve while he is on levofloxacin, I have sent in another prescription for an additional 2 weeks of therapy. He will have a nurse visit early in the week and follow-up with me a week from today. Electronic Signature(s) Signed: 04/15/2023 12:01:31 PM By: [name] Guess MD FACS Previous Signature: 04/15/2023 11:57:33 AM Version By: [name] Guess MD FACS Entered By: [name] on 04/15/2023 09:01:31 -------------------------------------------------------------------------------- HxROS Details Patient Name: Date of Service: [name] 04/15/2023 11:00 A M Medical Record Number: [id number] Patient Account Number: [id number] Date of Birth/Sex: Treating RN: Jan 03, 1951 (72 y.o. M) Primary Care Provider: Nicoletta [name] Other Clinician: Referring Provider: Treating Provider/Extender: [name] in Treatment: 32 Information Obtained From Patient Chart Constitutional Symptoms (General Health) Medical History: Past Medical History Notes: obesity Eyes Medical History: Positive for: Cataracts - right eye removed Negative for: Glaucoma; Optic Neuritis Ear/Nose/Mouth/Throat Medical History: Negative for: Chronic sinus problems/congestion; Middle ear problems Past Medical History Notes: hard of hearing Hematologic/Lymphatic Medical History: Positive for: Anemia - macrocytic; Lymphedema Respiratory Medical History: Past Medical History Notes: frozen diaphragm right Cardiovascular Medical History: Positive for: Congestive Heart Failure; Deep Vein Thrombosis - left leg; Hypertension; Peripheral Venous Disease Past Medical History Notes: hyperlipidemia Endocrine Medical History: Negative for: Type I Diabetes; Type II Diabetes LAVEL, ATLEE ([id number]) [id number] Page 15 of 16 Medical History: Negative for: End Stage Renal Disease Integumentary  (Skin) Medical History: Negative for: History of Burn Musculoskeletal Medical History: Past Medical History Notes: myositis, lumbar DDD, spinal stenosis, cervical facet joint syndrome Oncologic Medical History: Negative for: Received Chemotherapy; Received Radiation Psychiatric Medical History: Negative for: Anorexia/bulimia; Confinement Anxiety HBO Extended History Items Eyes: Cataracts Immunizations Pneumococcal Vaccine: Received Pneumococcal Vaccination: Yes Received Pneumococcal Vaccination On or After 60th Birthday: Yes Implantable Devices No devices added Hospitalization / Surgery History Type of Hospitalization/Surgery cervical fusion lumbar surgery coronary stent placement lasik eye surgery hydrocele excision Family and Social History Cancer: Yes - Mother; Diabetes: No; Heart Disease: Yes - Father,Mother; Hereditary Spherocytosis: No; Hypertension: Yes - Father,Siblings; Kidney Disease: No; Lung Disease: Yes - Siblings; Seizures: No; Stroke: No; Thyroid Problems: No; Tuberculosis: No; Former smoker - quit 1991; Marital Status - Married; Alcohol Use: Moderate; Drug Use: No History; Caffeine Use: Daily - coffee; Financial Concerns: No; Food, Clothing or Shelter Needs: No; Support  A M Medical Record Number: [id number] Patient Account Number: [id number] Date of Birth/Sex: Treating RN: 22-Feb-1951 (72 y.o. 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/09/2022 No Yes I87.2 Venous insufficiency (chronic) (peripheral) 02/09/2022 No Yes I89.0 Lymphedema, not elsewhere classified 02/09/2022 No Yes [name], [name] ([id number]) [id number] Page 8 of 16 I10 Essential (primary) hypertension 02/09/2022 No Yes I25.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 angina pectoris 02/09/2022 No Yes Inactive Problems ICD-10 Code Description Active Date Inactive Date L97.512 Non-pressure chronic ulcer of other part of right foot with fat layer exposed 02/17/2022 02/17/2022 Resolved Problems ICD-10 Code Description Active Date Resolved Date L97.821 Non-pressure chronic ulcer of other part of left lower leg limited to breakdown of skin 03/11/2023 03/11/2023 L98.492 Non-pressure chronic ulcer of skin of other sites with fat layer exposed 03/11/2023 03/11/2023 Electronic Signature(s) Signed: 04/15/2023 11:54:02 AM By: [name] Guess MD FACS Entered By: [name] on 04/15/2023 08:54:02 -------------------------------------------------------------------------------- Progress Note Details Patient Name: Date of Service: [name] 04/15/2023 11:00 A M Medical Record Number: [id number] Patient Account Number: [id number] Date of Birth/Sex: Treating RN: 04-04-1951 (72 y.o. M) Primary Care Provider: Nicoletta [name] Other Clinician: Referring Provider: Treating Provider/Extender: [name] in Treatment: 35 Subjective Chief Complaint Information obtained from Patient Patient seen for complaints of Non-Healing Wounds. History of Present Illness (HPI) ADMISSION 02/09/2022 This is a 72 year old man who recently moved to  [place] from [place]. He has a history of of coronary artery disease and prior left popliteal vein DVT . He is not diabetic and does not smoke. 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 He has had multiple spinal surgeries and has limited mobility. He primarily uses a motorized wheelchair. He has had lymphedema for many years and does have lymphedema pumps although he says he does not use them frequently. He has wounds on his bilateral lower extremities. He was receiving care in [place] for these prior to his move. They were apparently packing his wounds with Betadine soaked gauze. He has worn compression wraps in the past but not recently. He did say that they helped tremendously. In clinic today, his right ABI is noncompressible but has good signals; the left ABI was 1.17. No formal vascular studies have been performed. On his left dorsal foot, there is a circular lesion that exposes the fat layer. There is fairly heavy slough accumulation within the wound, but no significant odor. On his right medial calf, he has multiple pinholes that have slough buildup in them and probe for about 2 to 4 mm in depth. They are draining clear fluid. On his right lateral midfoot, he has ulceration consistent with venous stasis. It is geographic and has slough accumulation. 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 02/17/2022: All of the wounds are little bit larger today and have more slough accumulation. Today, the medial calf openings are larger and probe to something that feels calcified. There is odor coming from his wounds. His  System Lacking: No; Transportation Concerns: No Electronic Signature(s) Signed: 04/15/2023 12:02:24 PM By: [name] Guess MD FACS Entered By: [name] on 04/15/2023 08:55:22 -------------------------------------------------------------------------------- SuperBill Details Patient Name: Date of Service: [name], [name] 04/15/2023 Medical Record Number: [id number] Patient Account Number: [id number] Date of Birth/Sex: Treating RN: Sep 26, 1950 (72 y.o. M) Primary Care Provider: Nicoletta [name] Other Clinician: Referring Provider: Treating Provider/Extender: [name] in Treatment: [address] 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) I89.0 Lymphedema, not elsewhere classified I10 Essential (primary) hypertension I25.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 angina pectoris Facility Procedures : CPT4 Code: [id number] Description: 97597 - DEBRIDE WOUND 1ST 20 SQ CM OR < ICD-10 Diagnosis Description 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ed Modifier: Quantity: 1 : CPT4 Code: 34742595 Description: (Facility Use Only) 29581LT - APPLY MULTLAY COMPRS LWR LT LEG Modifier: 59 Quantity: 1 Physician Procedures : CPT4 Code Description Modifier [id number] - WC PHYS LEVEL 4 - EST PT 25 ICD-10 Diagnosis Description 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

## 2023-04-17 ENCOUNTER — Encounter: Payer: Self-pay | Admitting: Internal Medicine

## 2023-04-18 MED ORDER — ROSUVASTATIN CALCIUM 5 MG PO TABS: 5.0000 mg | ORAL_TABLET | Freq: Every day | ORAL | 0 refills | Status: DC

## 2023-04-18 MED ORDER — EZETIMIBE 10 MG PO TABS: 10.0000 mg | ORAL_TABLET | Freq: Every day | ORAL | 0 refills | Status: DC

## 2023-04-18 NOTE — Addendum Note (Signed)
Addended by: Margaret Pyle D on: 04/18/2023 11:49 AM   Modules accepted: Orders

## 2023-04-19 ENCOUNTER — Ambulatory Visit (HOSPITAL_BASED_OUTPATIENT_CLINIC_OR_DEPARTMENT_OTHER): Payer: Medicare Other | Admitting: General Surgery

## 2023-04-22 ENCOUNTER — Encounter (HOSPITAL_BASED_OUTPATIENT_CLINIC_OR_DEPARTMENT_OTHER): Payer: Medicare Other | Admitting: General Surgery

## 2023-04-22 DIAGNOSIS — I872 Venous insufficiency (chronic) (peripheral): Secondary | ICD-10-CM | POA: Diagnosis not present

## 2023-04-22 DIAGNOSIS — M609 Myositis, unspecified: Secondary | ICD-10-CM | POA: Diagnosis not present

## 2023-04-22 DIAGNOSIS — L97521 Non-pressure chronic ulcer of other part of left foot limited to breakdown of skin: Secondary | ICD-10-CM | POA: Diagnosis not present

## 2023-04-22 DIAGNOSIS — I890 Lymphedema, not elsewhere classified: Secondary | ICD-10-CM | POA: Diagnosis not present

## 2023-04-22 DIAGNOSIS — L942 Calcinosis cutis: Secondary | ICD-10-CM | POA: Diagnosis not present

## 2023-04-22 DIAGNOSIS — L97812 Non-pressure chronic ulcer of other part of right lower leg with fat layer exposed: Secondary | ICD-10-CM | POA: Diagnosis not present

## 2023-04-22 NOTE — Progress Notes (Signed)
[name], [name] ([id number]) [id number] Page 1 of 11 Visit Report for 04/22/2023 Arrival Information Details Patient Name: Date of Service: [name], [name] 04/22/2023 11:00 A M Medical Record Number: [id number] Patient Account Number: [id number] Date of Birth/Sex: Treating RN: [birth year] (72 y.o. [name] Primary Care Idona Stach: [name] Other Clinician: Referring Brinklee Cisse: Treating Jalik Gellatly/Extender: [name] in Treatment: 15 Visit Information History Since Last Visit Added or deleted any medications: No Patient Arrived: Walker Any new allergies or adverse reactions: No Arrival Time: 10:51 Had a fall or experienced change in No Accompanied By: self activities of daily living that may affect Transfer Assistance: None risk of falls: Patient Identification Verified: Yes Signs or symptoms of abuse/neglect since last visito No Secondary Verification Process Completed: Yes Hospitalized since last visit: No Patient Requires Transmission-Based Precautions: No Implantable device outside of the clinic excluding No Patient Has Alerts: Yes cellular tissue based products placed in the center Patient Alerts: R ABI= N/C, TBI= .75 since last visit: L ABI =N/C, TBI = .57 Has Dressing in Place as Prescribed: Yes Has Compression in Place as Prescribed: Yes Pain Present Now: No Electronic Signature(s) Signed: 04/22/2023 12:02:45 PM By: Zenaida Deed RN, BSN Entered By: Zenaida Deed on 04/22/2023 07:53:08 -------------------------------------------------------------------------------- Compression Therapy Details Patient Name: Date of Service: [name] 04/22/2023 11:00 A M Medical Record Number: [id number] Patient Account Number: [id number] Date of Birth/Sex: Treating RN: [birth year] (72 y.o. [name] Primary Care Talton Delpriore: [name] Other Clinician: Referring Lenell Mcconnell: Treating Hobie Kohles/Extender: [name] in Treatment: 62 Compression Therapy Performed for Wound Assessment: Wound #3 Right,Medial Lower Leg Performed By: Clinician Zenaida Deed, RN Compression Type: Three Layer Post Procedure Diagnosis Same as Pre-procedure Electronic Signature(s) Signed: 04/22/2023 12:02:45 PM By: Zenaida Deed RN, BSN Entered By: Zenaida Deed on 04/22/2023 08:18:12 [name] ([id number]) [id number] Page 2 of 11 -------------------------------------------------------------------------------- Compression Therapy Details Patient Name: Date of Service: [name], [name] 04/22/2023 11:00 A M Medical Record Number: [id number] Patient Account Number: [id number] Date of Birth/Sex: Treating RN: 09-May-1951 (72 y.o. [name] Primary Care Margaret Cockerill: [name] Other Clinician: Referring Davinity Fanara: Treating Clarissa Laird/Extender: [name] in Treatment: 72 Compression Therapy Performed for Wound Assessment: Wound #6 Right,Anterior Lower Leg Performed By: Clinician Zenaida Deed, RN Compression Type: Three Layer Post Procedure Diagnosis Same as Pre-procedure Electronic Signature(s) Signed: 04/22/2023 12:02:45 PM By: Zenaida Deed RN, BSN Entered By: Zenaida Deed on 04/22/2023 08:18:12 -------------------------------------------------------------------------------- Encounter Discharge Information Details Patient Name: Date of Service: [name], [name] 04/22/2023 11:00 A M Medical Record Number: [id number] Patient Account Number: [id number] Date of Birth/Sex: Treating RN: 1950-11-26 (72 y.o. [name] Primary Care Anay Walter: [name] Other Clinician: Referring Naseem Varden: Treating Ollie Esty/Extender: [name] in Treatment: 39 Encounter Discharge Information Items Post Procedure Vitals Discharge Condition: Stable Temperature (F): 98.8 Ambulatory Status: Walker Pulse  (bpm): 69 Discharge Destination: Home Respiratory Rate (breaths/min): 18 Transportation: Private Auto Blood Pressure (mmHg): 129/81 Accompanied By: self Schedule Follow-up Appointment: Yes Clinical Summary of Care: Patient Declined Electronic Signature(s) Signed: 04/22/2023 12:02:45 PM By: Zenaida Deed RN, BSN Entered By: Zenaida Deed on 04/22/2023 09:00:24 -------------------------------------------------------------------------------- Lower Extremity Assessment Details Patient Name: Date of Service: [name], [name] 04/22/2023 11:00 A M Medical Record Number: [id number] Patient Account Number: [id number] Date of Birth/Sex: Treating RN: 02-17-1951 (72 y.o. [name] Primary Care Dianna Deshler: [name] Other Clinician: Referring Annabelle Rexroad: Treating Lemya Greenwell/Extender: [name] in Treatment: 62 Edema Assessment Assessed: [Left:  No] [Right: No] Edema: [Left: Yes] [Right: Yes] Calf [name], [name] ([id number]) [id number] Page 3 of 11 Left: Right: Point of Measurement: From Medial Instep 41 cm 42 cm Ankle Left: Right: Point of Measurement: From Medial Instep 25 cm 27.5 cm Vascular Assessment Pulses: Dorsalis Pedis Palpable: [Left:Yes] [Right:Yes] Extremity colors, hair growth, and conditions: Extremity Color: [Left:Hyperpigmented] [Right:Hyperpigmented] Hair Growth on Extremity: [Left:Yes] [Right:Yes] Temperature of Extremity: [Left:Warm] [Right:Warm] Capillary Refill: [Left:< 3 seconds] [Right:< 3 seconds] Dependent Rubor: [Left:No No] [Right:No No] Electronic Signature(s) Signed: 04/22/2023 12:02:45 PM By: Zenaida Deed RN, BSN Entered By: Zenaida Deed on 04/22/2023 07:58:32 -------------------------------------------------------------------------------- Multi Wound Chart Details Patient Name: Date of Service: [name] 04/22/2023 11:00 A M Medical Record Number: [id number] Patient Account Number:  [id number] Date of Birth/Sex: Treating RN: 09-Aug-1950 (72 y.o. M) Primary Care Bethann Qualley: [name] Other Clinician: Referring Macky Galik: Treating Juddson Cobern/Extender: [name] in Treatment: 68 Vital Signs Height(in): 71 Pulse(bpm): 69 Weight(lbs): 267 Blood Pressure(mmHg): 129/81 Body Mass Index(BMI): 37.2 Temperature(F): 98.8 Respiratory Rate(breaths/min): 18 [3:Photos:] Right, Medial Lower Leg Right, Anterior Lower Leg Left, Dorsal Foot Wound Location: Gradually Appeared Gradually Appeared Gradually Appeared Wounding Event: Lymphedema Calcinosis Lymphedema Primary Etiology: Cataracts, Anemia, Lymphedema, Cataracts, Anemia, Lymphedema, Cataracts, Anemia, Lymphedema, Comorbid History: Congestive Heart Failure, Deep Vein Congestive Heart Failure, Deep Vein Congestive Heart Failure, Deep Vein Thrombosis, Hypertension, Peripheral Thrombosis, Hypertension, Peripheral Thrombosis, Hypertension, Peripheral Venous Disease Venous Disease Venous Disease 12/07/2021 05/12/2022 03/18/2023 Date Acquired: 62 49 5 Weeks of Treatment: Open Open Healed - Epithelialized Wound Status: No No No Wound Recurrence: No Yes No Clustered Wound: N/A 2 N/A Clustered Quantity: 8.6x6.3x0.3 1.5x0.4x0.2 0x0x0 Measurements L x W x D (cm) 42.553 0.471 0 A (cm) : rea 12.766 0.094 0 Volume (cm) : [name] ([id number]) [id number] Page 4 of 11 -696.70% -99.60% 100.00% % Reduction in Area: -497.70% -291.70% 100.00% % Reduction in Volume: Full Thickness With Exposed Support Full Thickness Without Exposed Full Thickness Without Exposed Classification: Structures Support Structures Support Structures Medium Medium None Present Exudate A mount: Serosanguineous Serosanguineous N/A Exudate Type: red, brown red, brown N/A Exudate Color: Indistinct, nonvisible Epibole Flat and Intact Wound Margin: Medium (34-66%) None Present (0%) None Present  (0%) Granulation A mount: Red N/A N/A Granulation Quality: Medium (34-66%) None Present (0%) None Present (0%) Necrotic A mount: Fat Layer (Subcutaneous Tissue): Yes Fat Layer (Subcutaneous Tissue): Yes Fascia: No Exposed Structures: Fascia: No Fascia: No Fat Layer (Subcutaneous Tissue): No Tendon: No Tendon: No Tendon: No Muscle: No Muscle: No Muscle: No Joint: No Joint: No Joint: No Bone: No Bone: No Bone: No Small (1-33%) Large (67-100%) Large (67-100%) Epithelialization: Debridement - Selective/Open Wound N/A N/A Debridement: Pre-procedure Verification/Time Out 11:20 N/A N/A Taken: Lidocaine 4% Topical Solution N/A N/A Pain Control: Slough N/A N/A Tissue Debrided: Non-Viable Tissue N/A N/A Level: 21.27 N/A N/A Debridement A (sq cm): rea Curette N/A N/A Instrument: Minimum N/A N/A Bleeding: Pressure N/A N/A Hemostasis A chieved: 0 N/A N/A Procedural Pain: 0 N/A N/A Post Procedural Pain: Procedure was tolerated well N/A N/A Debridement Treatment Response: 8.6x6.3x0.4 N/A N/A Post Debridement Measurements L x W x D (cm) 17.021 N/A N/A Post Debridement Volume: (cm) Excoriation: No No Abnormalities Noted No Abnormalities Noted Periwound Skin Texture: Maceration: Yes No Abnormalities Noted No Abnormalities Noted Periwound Skin Moisture: Rubor: Yes Hemosiderin Staining: Yes No Abnormalities Noted Periwound Skin Color: Erythema: No No Abnormality No Abnormality No Abnormality Temperature: Yes N/A N/A Tenderness on Palpation: Compression Therapy Compression Therapy N/A Procedures Performed: Debridement  Treatment Notes Electronic Signature(s) Signed: 04/22/2023 11:37:27 AM By: Duanne Guess MD FACS Entered By: Duanne Guess on 04/22/2023 08:37:27 -------------------------------------------------------------------------------- Multi-Disciplinary Care Plan Details Patient Name: Date of Service: [name], [name] 04/22/2023 11:00 A M Medical Record  Number: [id number] Patient Account Number: [id number] Date of Birth/Sex: Treating RN: 03/24/1951 (72 y.o. [name] Primary Care Taylin Mans: [name] Other Clinician: Referring Linnie Delgrande: Treating Minnah Llamas/Extender: [name] in Treatment: 48 Multidisciplinary Care Plan reviewed with physician Active Inactive Venous Leg Ulcer Nursing Diagnoses: Knowledge deficit related to disease process and management Potential for venous Insuffiency (use before diagnosis confirmed) Goals: Patient will maintain optimal edema control [name], [name] ([id number]) [phone] Page 5 of 11 Date Initiated: 02/17/2022 Target Resolution Date: 05/20/2023 Goal Status: Active Interventions: Assess peripheral edema status every visit. Compression as ordered Provide education on venous insufficiency Treatment Activities: Non-invasive vascular studies : 02/17/2022 Therapeutic compression applied : 02/17/2022 Notes: Wound/Skin Impairment Nursing Diagnoses: Impaired tissue integrity Knowledge deficit related to ulceration/compromised skin integrity Goals: Patient/caregiver will verbalize understanding of skin care regimen Date Initiated: 02/17/2022 Target Resolution Date: 05/20/2023 Goal Status: Active Ulcer/skin breakdown will have a volume reduction of 30% by week 4 Date Initiated: 02/17/2022 Date Inactivated: 03/10/2022 Target Resolution Date: 03/10/2022 Unmet Reason: infection being treated, Goal Status: Unmet waiting on derm appte Ulcer/skin breakdown will have a volume reduction of 50% by week 8 Date Initiated: 03/10/2022 Date Inactivated: 04/14/2022 Target Resolution Date: 04/07/2022 Goal Status: Unmet Unmet Reason: calcinosis Ulcer/skin breakdown will have a volume reduction of 80% by week 12 Date Initiated: 04/14/2022 Date Inactivated: 05/05/2022 Target Resolution Date: 05/05/2022 Unmet Reason: infection, chronic Goal Status: Unmet  condition Interventions: Assess patient/caregiver ability to obtain necessary supplies Assess patient/caregiver ability to perform ulcer/skin care regimen upon admission and as needed Assess ulceration(s) every visit Provide education on ulcer and skin care Treatment Activities: Skin care regimen initiated : 02/17/2022 Topical wound management initiated : 02/17/2022 Notes: Electronic Signature(s) Signed: 04/22/2023 12:02:45 PM By: Zenaida Deed RN, BSN Entered By: Zenaida Deed on 04/22/2023 08:14:45 -------------------------------------------------------------------------------- Negative Pressure Wound Therapy Maintenance (NPWT) Details Patient Name: Date of Service: [name], [name] 04/22/2023 11:00 A M Medical Record Number: [id number] Patient Account Number: [id number] Date of Birth/Sex: Treating RN: 10-07-1950 (72 y.o. [name] Primary Care Kyri Shader: [name] Other Clinician: Referring Shemiah Rosch: Treating Osman Calzadilla/Extender: [name] in Treatment: 62 NPWT Maintenance Performed for: Wound #3 Right, Medial Lower Leg Additional Injuries Covered: No Performed By: Zenaida Deed, RN Type: VAC System Coverage Size (sq cm): 54.78 Pressure Type: Constant Pressure Setting: 125 mmHG Drain Type: None [name], [name] ([id number]) [id number] Page 6 of 11 Primary Contact: None Sponge/Dressing Type: Foam- Black Date Initiated: 03/11/2023 Dressing Removed: Yes Quantity of Sponges/Gauze Removed: 1 Canister Changed: No Canister Exudate Volume: 100 Dressing Reapplied: Yes Quantity of Sponges/Gauze Inserted: 1 Respones T Treatment: o good Days On NPWT : 43 Post Procedure Diagnosis Same as Pre-procedure Notes peel and place dressing Electronic Signature(s) Signed: 04/22/2023 12:02:45 PM By: Zenaida Deed RN, BSN Entered By: Zenaida Deed on 04/22/2023  09:02:01 -------------------------------------------------------------------------------- Pain Assessment Details Patient Name: Date of Service: [name] 04/22/2023 11:00 A M Medical Record Number: [id number] Patient Account Number: [id number] Date of Birth/Sex: Treating RN: [birth year] (72 y.o. [name] Primary Care Sherrye Puga: [name] Other Clinician: Referring Areg Bialas: Treating Ameshia Pewitt/Extender: [name] in Treatment: 44 Active Problems Location of Pain Severity and Description of Pain Patient Has Paino No Site Locations Rate the pain. Current Pain  Level: 0 Pain Management and Medication Current Pain Management: Electronic Signature(s) Signed: 04/22/2023 12:02:45 PM By: Zenaida Deed RN, BSN Entered By: Zenaida Deed on 04/22/2023 07:53:22 [name] ([id number]) [id number] Page 7 of 11 -------------------------------------------------------------------------------- Patient/Caregiver Education Details Patient Name: Date of Service: KHIRY, PASQUARIELLO 9/27/2024andnbsp11:00 A M Medical Record Number: [id number] Patient Account Number: [id number] Date of Birth/Gender: Treating RN: 06-02-1951 (72 y.o. [name] Primary Care Physician: [name] Other Clinician: Referring Physician: Treating Physician/Extender: [name] in Treatment: 59 Education Assessment Education Provided To: Patient Education Topics Provided Venous: Methods: Explain/Verbal Responses: Reinforcements needed, State content correctly Wound/Skin Impairment: Methods: Explain/Verbal Responses: Reinforcements needed, State content correctly Electronic Signature(s) Signed: 04/22/2023 12:02:45 PM By: Zenaida Deed RN, BSN Entered By: Zenaida Deed on 04/22/2023 08:16:02 -------------------------------------------------------------------------------- Wound Assessment Details Patient  Name: Date of Service: JORIAN, WILLHOITE 04/22/2023 11:00 A M Medical Record Number: [id number] Patient Account Number: [id number] Date of Birth/Sex: Treating RN: [birth year] (72 y.o. [name] Primary Care Tamarah Bhullar: [name] Other Clinician: Referring Rasheena Talmadge: Treating Tarig Zimmers/Extender: [name] in Treatment: 62 Wound Status Wound Number: 3 Primary Lymphedema Etiology: Wound Location: Right, Medial Lower Leg Wound Open Wounding Event: Gradually Appeared Status: Date Acquired: 12/07/2021 Comorbid Cataracts, Anemia, Lymphedema, Congestive Heart Failure, Deep Weeks Of Treatment: 62 History: Vein Thrombosis, Hypertension, Peripheral Venous Disease Clustered Wound: No Photos [name], [name] ([id number]) [id number] Page 8 of 11 Wound Measurements Length: (cm) 8.6 Width: (cm) 6.3 Depth: (cm) 0.3 Area: (cm) 42.553 Volume: (cm) 12.766 % Reduction in Area: -696.7% % Reduction in Volume: -497.7% Epithelialization: Small (1-33%) Tunneling: No Undermining: No Wound Description Classification: Full Thickness With Exposed Suppo Wound Margin: Indistinct, nonvisible Exudate Amount: Medium Exudate Type: Serosanguineous Exudate Color: red, brown rt Structures Foul Odor After Cleansing: No Slough/Fibrino Yes Wound Bed Granulation Amount: Medium (34-66%) Exposed Structure Granulation Quality: Red Fascia Exposed: No Necrotic Amount: Medium (34-66%) Fat Layer (Subcutaneous Tissue) Exposed: Yes Necrotic Quality: Adherent Slough Tendon Exposed: No Muscle Exposed: No Joint Exposed: No Bone Exposed: No Periwound Skin Texture Texture Color No Abnormalities Noted: Yes No Abnormalities Noted: No Erythema: No Moisture Rubor: Yes No Abnormalities Noted: Yes Temperature / Pain Temperature: No Abnormality Tenderness on Palpation: Yes Treatment Notes Wound #3 (Lower Leg) Wound Laterality: Right, Medial Cleanser Peri-Wound  Care Skin Prep Discharge Instruction: cavelon to macerated wound margins Sween Lotion (Moisturizing lotion) Discharge Instruction: Apply moisturizing lotion as directed Topical Gentamicin Discharge Instruction: to open areas Mupirocin Ointment Discharge Instruction: Apply Mupirocin (Bactroban) as instructed Primary Dressing Maxorb Extra Ag+ Alginate Dressing, 4x4.75 (in/in) Discharge Instruction: Apply to excoriated weeping areas peel and place vac Secondary Dressing Secured With Compression Wrap ThreePress (3 layer compression wrap) Discharge Instruction: Apply three layer compression as directed. Compression Stockings Add-Ons Electronic Signature(s) Signed: 04/22/2023 12:02:45 PM By: Zenaida Deed RN, BSN Entered By: Zenaida Deed on 04/22/2023 08:11:04 [name] ([id number]) [id number] Page 9 of 11 -------------------------------------------------------------------------------- Wound Assessment Details Patient Name: Date of Service: LIONELL, MATUSZAK 04/22/2023 11:00 A M Medical Record Number: [id number] Patient Account Number: [id number] Date of Birth/Sex: Treating RN: Jan 12, 1951 (72 y.o. [name] Primary Care Patricio Popwell: [name] Other Clinician: Referring Raschelle Wisenbaker: Treating Veer Elamin/Extender: [name] in Treatment: 62 Wound Status Wound Number: 6 Primary Calcinosis Etiology: Wound Location: Right, Anterior Lower Leg Wound Open Wounding Event: Gradually Appeared Status: Date Acquired: 05/12/2022 Comorbid Cataracts, Anemia, Lymphedema, Congestive Heart Failure, Deep Weeks Of Treatment: 49 History: Vein Thrombosis, Hypertension, Peripheral Venous Disease Clustered Wound: Yes Photos  Wound Measurements Length: (cm) Width: (cm) Depth: (cm) Clustered Quantity: Area: (cm) Volume: (cm) 1.5 % Reduction in Area: -99.6% 0.4 % Reduction in Volume: -291.7% 0.2 Epithelialization: Large (67-100%) 2  Tunneling: No 0.471 Undermining: No 0.094 Wound Description Classification: Full Thickness Without Exposed Sup Wound Margin: Epibole Exudate Amount: Medium Exudate Type: Serosanguineous Exudate Color: red, brown port Structures Foul Odor After Cleansing: No Slough/Fibrino Yes Wound Bed Granulation Amount: None Present (0%) Exposed Structure Necrotic Amount: None Present (0%) Fascia Exposed: No Fat Layer (Subcutaneous Tissue) Exposed: Yes Tendon Exposed: No Muscle Exposed: No Joint Exposed: No Bone Exposed: No Periwound Skin Texture Texture Color No Abnormalities Noted: Yes No Abnormalities Noted: No Hemosiderin Staining: Yes Moisture No Abnormalities Noted: Yes Temperature / Pain Temperature: No Abnormality Treatment Notes Wound #6 (Lower Leg) Wound Laterality: Right, Anterior KAELAN, AMBLE ([id number]) [id number] Page 10 of 11 Peri-Wound Care Sween Lotion (Moisturizing lotion) Discharge Instruction: Apply moisturizing lotion as directed Topical Primary Dressing peel and place vac Secondary Dressing Secured With Compression Wrap ThreePress (3 layer compression wrap) Discharge Instruction: Apply three layer compression as directed. Compression Stockings Add-Ons Electronic Signature(s) Signed: 04/22/2023 12:02:45 PM By: Zenaida Deed RN, BSN Entered By: Zenaida Deed on 04/22/2023 08:12:54 -------------------------------------------------------------------------------- Wound Assessment Details Patient Name: Date of Service: ELISON, WORREL 04/22/2023 11:00 A M Medical Record Number: [id number] Patient Account Number: [id number] Date of Birth/Sex: Treating RN: 1951/02/13 (72 y.o. [name] Primary Care Koran Seabrook: [name] Other Clinician: Referring Simar Pothier: Treating Mechell Girgis/Extender: [name] in Treatment: 62 Wound Status Wound Number: 8 Primary Lymphedema Etiology: Wound  Location: Left, Dorsal Foot Wound Healed - Epithelialized Wounding Event: Gradually Appeared Status: Date Acquired: 03/18/2023 Comorbid Cataracts, Anemia, Lymphedema, Congestive Heart Failure, Deep Weeks Of Treatment: 5 History: Vein Thrombosis, Hypertension, Peripheral Venous Disease Clustered Wound: No Photos Wound Measurements Length: (cm) Width: (cm) Depth: (cm) Area: (cm) Volume: (cm) 0 % Reduction in Area: 100% 0 % Reduction in Volume: 100% 0 Epithelialization: Large (67-100%) 0 Tunneling: No 0 Undermining: No Wound Description Classification: Full Thickness Without Exposed Support Structures Wound Margin: Flat and Intact Exudate Amount: None Present [name], Demir ([id number]) Wound Bed Granulation Amount: None Present (0%) Necrotic Amount: None Present (0%) Foul Odor After Cleansing: No Slough/Fibrino No [id number] Page 11 of 11 Exposed Structure Fascia Exposed: No Fat Layer (Subcutaneous Tissue) Exposed: No Tendon Exposed: No Muscle Exposed: No Joint Exposed: No Bone Exposed: No Periwound Skin Texture Texture Color No Abnormalities Noted: Yes No Abnormalities Noted: Yes Moisture Temperature / Pain No Abnormalities Noted: Yes Temperature: No Abnormality Electronic Signature(s) Signed: 04/22/2023 12:02:45 PM By: Zenaida Deed RN, BSN Entered By: Zenaida Deed on 04/22/2023 08:13:30 -------------------------------------------------------------------------------- Vitals Details Patient Name: Date of Service: [name] 04/22/2023 11:00 A M Medical Record Number: [id number] Patient Account Number: [id number] Date of Birth/Sex: Treating RN: June 11, 1951 (72 y.o. [name] Primary Care Arsema Tusing: [name] Other Clinician: Referring Meili Kleckley: Treating Brennley Curtice/Extender: [name] in Treatment: 82 Vital Signs Time Taken: 11:06 Temperature (F): 98.8 Height (in): 71 Pulse (bpm):  69 Weight (lbs): 267 Respiratory Rate (breaths/min): 18 Body Mass Index (BMI): 37.2 Blood Pressure (mmHg): 129/81 Reference Range: 80 - 120 mg / dl Electronic Signature(s) Signed: 04/22/2023 12:02:45 PM By: Zenaida Deed RN, BSN Entered By: Zenaida Deed on 04/22/2023 08:07:13

## 2023-04-25 NOTE — Progress Notes (Signed)
[name] ([id number]) [id number] Page 1 of 16 Visit Report for 04/22/2023 Chief Complaint Document Details Patient Name: Date of Service: [name], [name] 04/22/2023 11:00 A M Medical Record Number: [id number] Patient Account Number: [id number] Date of Birth/Sex: Treating RN: [birth year] (72 y.o. M) Primary Care Provider: [name] Other Clinician: Referring Provider: Treating Provider/Extender: [name] in Treatment: 33 Information Obtained from: Patient Chief Complaint Patient seen for complaints of Non-Healing Wounds. Electronic Signature(s) Signed: 04/22/2023 11:37:38 AM By: Duanne Guess MD FACS Entered By: Duanne Guess on 04/22/2023 08:37:38 -------------------------------------------------------------------------------- Debridement Details Patient Name: Date of Service: [name] 04/22/2023 11:00 A M Medical Record Number: [id number] Patient Account Number: [id number] Date of Birth/Sex: Treating RN: Jan 02, 1951 (72 y.o. [name] Primary Care Provider: [name] Other Clinician: Referring Provider: Treating Provider/Extender: [name] in Treatment: 62 Debridement Performed for Assessment: Wound #3 Right,Medial Lower Leg Performed By: Physician Duanne Guess, MD The following information was scribed by: Zenaida Deed The information was scribed for: Duanne Guess Debridement Type: Debridement Level of Consciousness (Pre-procedure): Awake and Alert Pre-procedure Verification/Time Out Yes - 11:20 Taken: Start Time: 11:21 Pain Control: Lidocaine 4% T opical Solution Percent of Wound Bed Debrided: 50% T Area Debrided (cm): otal 21.27 Tissue and other material debrided: Non-Viable, Slough, Slough Level: Non-Viable Tissue Debridement Description: Selective/Open Wound Instrument: Curette Bleeding: Minimum Hemostasis Achieved: Pressure Procedural Pain:  0 Post Procedural Pain: 0 Response to Treatment: Procedure was tolerated well Level of Consciousness (Post- Awake and Alert procedure): Post Debridement Measurements of Total Wound Length: (cm) 8.6 Width: (cm) 6.3 Depth: (cm) 0.4 Volume: (cm) 17.021 Character of Wound/Ulcer Post Debridement: Improved [name], [name] ([id number]) [id number] Page 2 of 16 Post Procedure Diagnosis Same as Pre-procedure Electronic Signature(s) Signed: 04/22/2023 12:02:45 PM By: Zenaida Deed RN, BSN Signed: 04/25/2023 9:44:25 AM By: Duanne Guess MD FACS Entered By: Zenaida Deed on 04/22/2023 32:95:18 -------------------------------------------------------------------------------- HPI Details Patient Name: Date of Service: [name] 04/22/2023 11:00 A M Medical Record Number: [id number] Patient Account Number: [id number] Date of Birth/Sex: Treating RN: Oct 01, 1950 (72 y.o. M) Primary Care Provider: [name] Other Clinician: Referring Provider: Treating Provider/Extender: [name] in Treatment: 38 History of Present Illness HPI Description: ADMISSION 02/09/2022 This is a 72 year old man who recently moved to [place] from [place]. He has a history of of coronary artery disease and prior left popliteal vein DVT . He is not diabetic and does not smoke. 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 He has had multiple spinal surgeries and has limited mobility. He primarily uses a motorized wheelchair. He has had lymphedema for many years and does have lymphedema pumps although he says he does not use them frequently. He has wounds on his bilateral lower extremities. He was receiving care in [place] for these prior to his move. They were apparently packing his wounds with Betadine soaked gauze. He has worn compression wraps in the past but not recently. He did say that they helped tremendously. In clinic today, his right  ABI is noncompressible but has good signals; the left ABI was 1.17. No formal vascular studies have been performed. On his left dorsal foot, there is a circular lesion that exposes the fat layer. There is fairly heavy slough accumulation within the wound, but no significant odor. On his right medial calf, he has multiple pinholes that have slough buildup in them and probe for about 2 to 4 mm in depth. They are draining  clear fluid. On his right lateral midfoot, he has ulceration consistent with venous stasis. It is geographic and has slough accumulation. 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 02/17/2022: All of the wounds are little bit larger today and have more slough accumulation. Today, the medial calf openings are larger and probe to something that feels calcified. There is odor coming from his wounds. His vascular studies are scheduled for August 9 and 10. 02-24-2022 upon evaluation today patient presents for follow-up concerning his ongoing issues here with his lower extremities. With that being said he does have significant problems with what appears to be cellulitis unfortunately the PCR culture that was sent last week got crushed by [employer] in transit. With that being said we are going to reobtain a culture today although I am actually to do it on the right medial leg as this seems to be the most inflamed area today where there is some questionable drainage as well. I am going to remove some of the calcium deposits which are loosening obtain a deeper culture from this area. Fortunately I do not see any evidence of active infection systemically which is good news but locally I think we are having a bigger issue here. He does have his appointment next Thursday with vascular. Patient has also been referred to [facility] for further evaluation and treatment of the calcinosis for possible sulfate  injections. 03/04/2022: The culture that was performed last week grew out multiple species including Klebsiella, Morganella, and Pseudomonas aeruginosa. He had been prescribed Bactrim but this was not adequate to cover all of the species and levofloxacin was recommended. He completed the Bactrim but did not pick up the levofloxacin and begin taking it until yesterday. 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 He had arterial studies performed today. He does have evidence of peripheral arterial disease. Results copied here: +-------+-----------+-----------+------------+------------+ ABI/TBIT oday's ABIT oday's TBIPrevious ABIPrevious TBI +-------+-----------+-----------+------------+------------+ Right [place] 0.75    +-------+-----------+-----------+------------+------------+ Left [place] 0.57    +-------+-----------+-----------+------------+------------+ Arterial wall calcification precludes accurate ankle pressures and ABIs. Summary: Right: Resting right ankle-brachial index indicates noncompressible right lower extremity arteries. The right toe-brachial index is normal. Left: Resting left ankle-brachial index indicates noncompressible left lower extremity arteries. The left toe-brachial index is abnormal. He continues to have further tissue breakdown at the right medial calf and there is ample slough accumulation along with necrotic subcutaneous tissue. The left dorsal foot wound has built up slough, as well. The right medial foot wound is nearly closed with just a light layer of eschar over the surface. The right dorsal lateral foot wound has also accumulated a fair amount of slough and remains quite tender. [name], [name] ([id number]) [id number] Page 3 of 16 03/10/2022: He has been taking the  prescribed levofloxacin and has about 3 days left of this. The erythema and induration on his right leg has improved. He continues to experience further breakdown of the right medial calf wound. There is extensive slough and debris accumulation there. There is heavy slough on his left dorsal foot wound, but no odor or purulent drainage. The right medial foot wound appears to be closed. The right dorsal lateral foot wound also has a fair amount of slough but it is less tender than at our last visit. He still does not have  an appointment with dermatology. 03/22/2022: The right anterior tibial wound has closed. The right lateral foot wound is nearly closed with just a little bit of slough. The left dorsal foot wound is smaller and continues to have some slough buildup but less so than at prior visits. There is good granulation tissue underlying the slough. Unfortunately the right medial calf wound continues to deteriorate. It has a foul odor today and a lot of necrotic tissue, the surrounding skin is also erythematous and moist. 03/30/2022: The right lateral foot wound continues to contract and just has a little bit of slough and eschar present. The left dorsal foot wound is also smaller with slough overlying good granulation tissue. The right medial calf wound actually looks quite a bit better today; there is no odor and there is much less necrotic tissue although some remains present. We have been using topical gentamicin and he has been taking oral Augmentin. 04/07/2022: The patient saw dermatology at [facility] last week. Unfortunately, it appears that they did not read any of the documentation that was provided. 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 Overall, it was a highly unsatisfactory consultation. In the meantime, however, we were able to identify compounding pharmacy who could create a topical sodium thiosulfate ointment. The patient has that with him today. Both of the right foot wounds are now closed. The left dorsal foot wound has contracted but still has a layer of slough on the surface. The medial calf wounds on the right all look a little bit better. They still have heavy slough along with the chunks of calcium. Everything is stained purple. 04/14/2022: The wound on his dorsal left foot is a little bit smaller again today. There is still slough accumulation on the surface. The medial calf wounds have quite a bit less slough accumulation today. His right leg, however, is warm and erythematous, concerning for cellulitis. 04/14/2022: He completed his course of Augmentin yesterday. The medial calf wound sites are much cleaner today and shallower. There is less fragmented calcium in the wounds. He has a lot of lymphedema fluid-like drainage from these wounds, but no purulent discharge or malodor. The wound on his dorsal left foot has also contracted and is shallower. There is a layer of slough over healthy granulation tissue. 05/05/2022: The left dorsal foot wound is smaller today with less slough and more granulation tissue. The right medial calf wounds are shallower, but have extensive slough and some fat necrosis. The drainage has diminished considerably, however. 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 05/12/2022: The left dorsal foot wound continues to contract and fill with granulation tissue. There is slough on the wound surface. The right medial calf wounds also continue to fill with healthy tissue, but there is remaining slough and fat necrosis present. He had a significant increase in his  drainage this week and it was a bright yellow-green color. He also had a new wound open over his right anterior tibial surface associated with the spike of cutaneous calcium. The leg is more red, as well, concerning for infection. 05/19/2022: His culture returned positive for Pseudomonas. He is currently taking a  course of oral levofloxacin. We have also been using topical gentamicin mixed in with his sodium thiosulfate. All of the wounds look better this week. 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 There is slough and nonviable subcutaneous tissue still present. The new anterior tibial wound just has a light layer of slough. The dorsal foot wound also has some slough present and appears a little dry today. 05/26/2022: The right medial leg wound continues to improve. It is feeling with granulation tissue, but still accumulates a fairly thick layer of slough on the surface. The more recent anterior tibial wound has remained quite small, but also has some slough on the surface. The left dorsal foot wound has contracted somewhat and has perimeter epithelialization. He completed his oral levofloxacin. 06/02/2022: The left dorsal foot wound continues to improve significantly. There is just a little slough on the wound surface. The right medial leg wound had black drainage, but it looks like silver alginate was applied last week and I theorized that silver sulfide was created with a combination of the silver alginate and the sodium thiosulfate. It did, however, have a bit of an odor to it and extensive slough accumulation. The small anterior tibial wound also had a bit of slough and nonviable subcutaneous tissue present. The skin of his leg was rather macerated, as well. 06/09/2022: The left dorsal foot wound is smaller again this week with just a light layer of slough on the surface. The right medial leg wound looks substantially better. The leg and periwound skin are no  longer red and macerated. There is good granulation tissue forming with less slough and nonviable tissue. The anterior tibial wound just has a small amount of slough accumulation. 06/16/2022: The left dorsal foot wound continues to contract. His wrap slipped a little bit, however, and his foot is more swollen. The right medial leg wound continues to improve. The underlying tissue is more vital-appearing and there is less slough. He does have substantial drainage. The small anterior tibial wound has a bit of slough accumulation. 06/23/2022: The left dorsal foot wound is about half the size as it was last week. There is just some light slough on the surface and some eschar buildup around the edges. The right anterior leg wound is small with a little slough on the surface. Unfortunately, the right medial leg wound deteriorated fairly substantially. It is malodorous with gray/green/black discoloration. 06/30/2022: The left dorsal foot wound continues to contract. It is nearly closed with just a light layer of slough present. The right anterior leg wound is about the same size and has a small amount of slough on the surface. The right medial leg wound looks a lot better than it did last week. The odor has abated and the tissue is less pulpy and necrotic-looking. There is still a fairly thick layer of slough present. 12/13; the left dorsal foot wound looks very healthy. There was no need to change the dressing here and no debridement. He has a small area on the right anterior lower leg apparently had not changed much in size. The area on the medial leg is the most problematic area this is large with some depth and a completely nonviable surface today. 07/14/2022: The left dorsal foot wound is smaller today with just a little slough and eschar present. The right anterior lower leg wound is about the same without any significant change. The medial leg wound has a black and green surface and is malodorous. No  purulent drainage and the periwound skin is intact. Edema control is suboptimal. 07/21/2022: The dorsal foot wound continues to contract and is nearly closed with just a little bit of slough and eschar on the surface. The right anterior lower leg wound is shallower today but otherwise the dimensions are unchanged. The medial leg wound looks significantly better although it still has a nonviable surface; the odor and black/green surface has resolved. His culture came back with multiple species sensitive to Augmentin, which was prescribed and a very low level of Pseudomonas, which should be handled by the topical gentamicin we have been using. 07/28/2022: The dorsal foot wound is down to just a very tiny opening with a little eschar on the surface. The right anterior tibial wound is about the same with some slough buildup. The right medial leg wound looks quite a bit better this week. There is thick rubbery slough on the surface, but the odor and drainage has improved significantly. 08/04/2022: The dorsal foot wound is almost healed, but the periwound skin has broken down. This appears to be secondary to moisture and adhesive from the absorbent pad. The right anterior tibial wound is shallower. The right medial leg wound continues to improve. It is smaller, still with rubbery slough on the surface. No malodor or purulent drainage. 08/11/2022: The dorsal foot wound is about the same size. It is a little bit dry with some slough accumulation. The right anterior tibial wound is also unchanged. The right medial leg wound is filling in further with good granulation tissue. Still with some slough buildup on the surface. [name], [name] ([id number]) [id number] Page 4 of 16 08/18/2022: His left leg is bright red, hot, and swollen with cellulitis. The dorsal foot wound actually looks quite good and is superficial with just a little bit of slough accumulation. The right anterior tibial wound  is unchanged. The right medial leg wound has filled in with good granulation tissue but has copious amounts of drainage. 08/25/2022: His cellulitis has responded nicely to the oral antibiotics. His dorsal foot wound is smaller but has a fair amount of slough buildup. The right anterior tibial wound remains stable and unchanged. The right medial leg wound has more granulation tissue under a layer of slough, but continues to have copious drainage. The drainage is actually causing skin irritation and threatens to result in breakdown. 09/01/2022: He continues to have profuse drainage from his wounds which is resulting in tissue maceration on both the dorsum of his left foot and the periwound distal to his medial leg wound. The dorsal foot wound is nearly healed, despite this. The medial right leg wound is filling in with good granulation tissue. We are working on getting home health assistance to change his dressings more frequently to try and control the drainage better. 09/08/2022: We have had success in controlling his drainage. The tissue maceration has improved significantly and his swelling has come down quite markedly. The medial right leg wound is much more superficial and has substantially less nonviable tissue present. The anterior tibial wound is a little bit larger, however with some nonviable tissue present. 09/15/2022: The dorsal foot wound is nearly closed. The anterior tibial wound measures slightly larger today. The medial right leg wound continues to improve quite dramatically with less nonviable tissue and more robust-looking granulation. Edema control is significantly better. 09/22/2022: The left dorsal foot wound remains open, albeit quite tiny. The anterior tibial wound is a little bit larger again today. The medial right leg wound continues to  improve with an increase in robust and healthy granulation tissue and less accumulation of nonviable tissue. 09/29/2022: His home health providers  wrapped his left leg extremely tightly and he ended up having to cut the wrap off of his foot. This resulted in his foot being very swollen and that the dorsal foot wound is a little bit larger today. The anterior tibial wound is about the same size with a little slough on the surface. The medial right leg wound continues to fill in. There are very few areas with any significant depth. There is still slough accumulation. 3/13; Patient has a left dorsal foot wound along with a right anterior tibial wound and right medial wound. We are using silver alginate to the left dorsal foot wound, Santyl and sodium thiosulfate compound ointment to the right lower extremity wounds under 4 layer compression. Patient has no issues or complaints today. 10/13/2022: The left dorsal foot wound is a little bit bigger today, but his foot is also a little bit more swollen. The right medial leg wound has filled in more and has substantially less depth. There is still some nonviable tissue present. The small anterior lower leg wound looks about the same. 10/20/2022: The left dorsal foot wound is epithelializing. There does not appear to be much open at this site. There is some eschar around the edges. The right medial leg wound continues to fill in with granulation tissue. The small anterior lower leg wound is a little bit bigger and there is a chunk of calcium protruding. He continues to have fairly copious drainage from the right leg. 10/27/2022: His home health agency failed to show up at all this past week and as a result, the excessive drainage from his right leg wound has resulted in tissue breakdown. His leg is red and irritated. The wounds themselves look about the same. The wound on his left dorsal foot is nearly closed underneath some eschar. 11/03/2022: We continue to have difficulty with his home health agency. He continues to have significant drainage from his right leg wound which has resulted in even further tissue  breakdown. He also was not taking his Lasix as prescribed and I think much of the drainage has been his lymphedema fluid choosing the path of least resistance. The wound on his left dorsal foot is closed, but in the process of cleaning the site, dry skin was pulled off and he has a new small superficial wound just adjacent to where the dorsal foot wound was. 11/12/2022: His left dorsal foot is completely healed. The right medial leg continues to deteriorate. He is not taking his Lasix as prescribed, nor is he using his lymphedema pumps at all. As a result, he has massive amounts of drainage saturating his dressings and causing further tissue breakdown on his medial leg. Today, his dressings had a foul odor, as well. 11/19/2022: His right medial leg looks even worse today. There is more erythema and moisture related tissue breakdown. The 2 anterior wounds look a little bit better. The culture that I took last week did produce Pseudomonas and he is currently taking levofloxacin for this. 11/26/2022: There has been significant improvement to his right medial leg. It is far less raw and many of the superficial open areas have epithelialized. He continues to cut the foot portion of his wrap and as result his foot is bulbous and swollen compared to the rest of his leg, where edema control is good. The smaller of the 2 anterior tibial wound is  closed, while the larger is contracting somewhat with a bit of slough at the base. 12/03/2022: His leg continues to demonstrate improvement. There has been epithelialization of much of the denuded area. He has a new small anterior tibial wound that has opened around a nidus of calcium. Edema control is improved. 12/10/2022: Continued improvement in the periwound skin. The anterior tibial wounds are about the same, but the medial leg wound has improved further. There is less nonviable tissue present. 12/17/2022: Between his visit on Tuesday with the nurse and today, he has had  significantly more drainage which has resulted in some periwound tissue maceration. I suspect this correlates somewhat with his diuretic use, as he says he takes his diuretic religiously on Saturday, Sunday, and Monday and then does not take it after Tuesday due to being out and about and not being close to easy restroom access. I suspect the excess fluid is just simply draining out of his leg. 12/21/2022: This was supposed to be a nurse visit, but his leg has broken down so significantly, that we have converted to a wound care visit. He did take his diuretics over the weekend, but despite this, he has had copious drainage and all of his tissues have broken down substantially from the right medial leg ulcer all the way down to his ankle and even wrapping posterior to this. There is blue-green staining on his dressing, suggesting ongoing presence of Pseudomonas aeruginosa. 12/24/2022: The wound already looks better than it did 2 days ago. He has been taking ciprofloxacin and the Urgo clean dressing we applied did a much better job of wicking the drainage away from his leg. There is slough on the entire surface of the medial leg wound, but the erythema and induration has improved in the past couple of days. 12/31/2022: Continued improvement of the wound. He says that the pain has basically abated since being on ciprofloxacin. Still with fairly heavy drainage and slough accumulation. 01/07/2023: Overall continued improvement. Still with heavy slough buildup, but less drainage. 01/14/2023: There is a little bit of improvement. The skin is still looking a bit excoriated. He has a few more days left of ciprofloxacin. 01/21/2023: There has been significant improvement. The periwound skin is in much better condition. The ulcers are shallower and actually have some good granulation tissue present under some slough. 01/28/2023: The wound continues to improve. All of the open areas measured smaller today. There is  slough accumulation on the surfaces. His drainage has decreased markedly. 02/04/2023: Continued improvement in the wound. Drainage remains significantly diminished. Slough accumulation is present but to a lesser extent. Periwound skin continues to improve. Edema control is good. YASHAS, CAMILLI ([id number]) [id number] Page 5 of 16 02/18/2023: The heavy drainage persists, although it is was not as significant today as it was at his nurse visit on Tuesday. The drainage is yellow and does not have the blue-green discoloration suggestive of Pseudomonas. The actual wounds on his leg are getting smaller, but the periwound skin is quite excoriated. 02/25/2023: His periwound skin is quite excoriated and he has a lot of clear yellow drainage. The main wounds are filling in further and epithelializing. 03/04/2023: The excoriation of the periwound skin has improved. Most of that area has dried up. The main wounds have less slough in them today. 03/11/2023: The main wounds have accumulated a little bit of slough. The periwound excoriation continues to improve. Unfortunately, he suffered a fall over the previous weekend and has abrasions on his knees bilaterally and  on his right wrist and upper arm. In addition, the skin on his left lower leg apparently blistered and opened, leaving several superficial wounds on this extremity. He has been approved for the [facility] and Press wound VAC and has the equipment with him today. 03/18/2023: He has extensive excoriation to the periwound skin on the right medial lower leg. The actual wounds themselves are smaller with slough accumulation. The top of his left foot has begun weeping again. Using the [facility] and Press wound VAC, he says that he through 4 canisters. He seems to be very fluid overloaded, without any significant dyspnea. 03/25/2023: The wound to his left anterior tibial surface has healed. The left dorsal foot wound has gotten smaller and has a small  area of hypertrophic granulation tissue present. The periwound skin on the right medial lower leg looks slightly better, but still appears excoriated. The actual wounds continue to contract but are still accumulating quite a bit of slough. The tiny right anterior leg wounds are contracting. 04/01/2023: The left dorsal foot wound got a little bit bigger this week. There is slough accumulation. The periwound skin on the right medial lower leg continues to improve. The main medial right lower leg wounds have a dark gray discoloration and there is a strong odor coming from them. The back of his right lower leg just above the ankle also has a little bit of new breakdown where he is draining edema fluid. The small anterior leg wounds are stable. 04/08/2023: Since starting antibiotics last week, there has been tremendous improvement. The left dorsal foot wound is nearly closed with just a little bit of eschar on the surface. The right medial lower leg is less excoriated and red and he has had substantially less drainage. The amount of slough on the open areas of his wounds has decreased markedly and there is no longer any odor. The back of his right lower leg that was newly open last week has dried up, but is not completely closed yet. 04/15/2023: The improvement continues with significantly less drainage, resulting in less excoriation and irritation. The back of his right lower leg is closed, as is the left dorsal foot wound. The open areas on the right medial leg are smaller. 04/22/2023: His drainage has diminished significantly. The tissue maceration and excoriation surrounding the right medial leg wound has also improved substantially. There is slough on all of the open wound surfaces. He does have a bit of an odor coming from his wound today, but there is no purulent drainage nor any significant discoloration of the drainage in his VAC. Electronic Signature(s) Signed: 04/22/2023 11:39:05 AM By: Duanne Guess MD FACS Entered By: Duanne Guess on 04/22/2023 08:39:05 -------------------------------------------------------------------------------- Physical Exam Details Patient Name: Date of Service: [name], [name] 04/22/2023 11:00 A M Medical Record Number: [id number] Patient Account Number: [id number] Date of Birth/Sex: Treating RN: 1951/05/10 (72 y.o. M) Primary Care Provider: [name] Other Clinician: Referring Provider: Treating Provider/Extender: [name] in Treatment: 54 Constitutional . . . . no acute distress. Respiratory Normal work of breathing on room air. Notes 04/22/2023: His drainage has diminished significantly. The tissue maceration and excoriation surrounding the right medial leg wound has also improved substantially. There is slough on all of the open wound surfaces. He does have a bit of an odor coming from his wound today, but there is no purulent drainage nor any significant discoloration of the drainage in his VAC. Electronic Signature(s) Signed: 04/22/2023 11:43:23 AM By: [name],  Victorino Dike MD FACS Entered By: Duanne Guess on 04/22/2023 08:43:22 [name] ([id number]) [id number] Page 6 of 16 -------------------------------------------------------------------------------- Physician Orders Details Patient Name: Date of Service: HIKARU, DELORENZO 04/22/2023 11:00 A M Medical Record Number: [id number] Patient Account Number: [id number] Date of Birth/Sex: Treating RN: Apr 14, 1951 (72 y.o. 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 - Dr. [name] RM 1 Return A Friday 10/4 @ 11:00 am Nurse Visit: - Tuesday 10/1 @ 3:00 pm Anesthetic (In clinic) Topical Lidocaine 4% applied to wound bed Bathing/ Shower/ Hygiene May shower with protection but do not get wound dressing(s) wet. Protect dressing(s) with water repellant cover (for example, large plastic bag) or a cast cover and may then take shower. Negative Presssure Wound Therapy Wound Vac to wound continuously at 187mm/hg pressure Black Foam - peel and place Edema Control - Lymphedema / SCD / Other Lymphedema Pumps. Use Lymphedema pumps on leg(s) 2-3 times a day for 45-60 minutes. If wearing any wraps or hose, do not remove them. Continue exercising as instructed. Elevate legs to the level of the heart or above for 30 minutes daily and/or when sitting for 3-4 times a day throughout the day. - throughout the day Avoid standing for long periods of time. Exercise regularly Wound Treatment Wound #3 - Lower Leg Wound Laterality: Right, Medial Peri-Wound Care: Skin Prep 1 x Per Week/30 Days Discharge Instructions: cavelon to macerated wound margins Peri-Wound Care: Sween Lotion (Moisturizing lotion) 1 x Per Week/30 Days Discharge Instructions: Apply moisturizing lotion as directed Topical: Gentamicin 1 x Per Week/30 Days Discharge Instructions: to open areas Topical: Mupirocin Ointment 1 x Per Week/30 Days Discharge Instructions: Apply Mupirocin (Bactroban) as instructed Prim Dressing: Maxorb Extra  Ag+ Alginate Dressing, 4x4.75 (in/in) 1 x Per Week/30 Days ary Discharge Instructions: Apply to excoriated weeping areas Prim Dressing: peel and place vac ary 1 x Per Week/30 Days Compression Wrap: ThreePress (3 layer compression wrap) 1 x Per Week/30 Days Discharge Instructions: Apply three layer compression as directed. Wound #6 - Lower Leg Wound Laterality: Right, Anterior Peri-Wound Care: Sween Lotion (Moisturizing lotion) 1 x Per Week/30 Days Discharge Instructions: Apply moisturizing lotion as directed CLEBURNE, SAVINI ([id number]) [id number] Page 7 of 16 Prim Dressing: peel and place vac ary 1 x Per Week/30 Days Compression Wrap: ThreePress (3 layer compression wrap) 1 x Per Week/30 Days Discharge Instructions: Apply three layer compression as directed. Electronic Signature(s) Signed: 04/25/2023 9:44:25 AM By: Duanne Guess MD FACS Entered By: Duanne Guess on 04/22/2023 08:43:45 -------------------------------------------------------------------------------- Problem List Details Patient Name: Date of Service: [name] 04/22/2023 11:00 A M Medical Record Number: [id number] Patient Account Number: [id number] Date of Birth/Sex: Treating RN: [birth year] (72 y.o. 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) (peripheral) 02/09/2022 No Yes I89.0 Lymphedema, not elsewhere classified 02/09/2022 No Yes Z86.718 Personal history of other venous thrombosis and embolism 02/09/2022 No Yes I10 Essential (primary) hypertension 02/09/2022 No Yes I25.10 Atherosclerotic  heart disease of native coronary artery without angina pectoris 02/09/2022 No Yes Inactive Problems ICD-10 Code Description Active Date Inactive Date L97.512 Non-pressure chronic ulcer of other part of right foot with fat layer exposed 02/17/2022 02/17/2022 Resolved Problems ICD-10 [name], [name] ([id number]) [id number] Page 8 of 16 Code Description Active Date Resolved Date L97.821 Non-pressure chronic ulcer of other part of left lower leg limited to breakdown of skin 03/11/2023 03/11/2023 L98.492 Non-pressure chronic ulcer of skin of other sites with fat layer exposed 03/11/2023 03/11/2023 L97.521 Non-pressure chronic ulcer of other part of left foot limited to breakdown of skin 03/18/2023 02/09/2022 Electronic Signature(s) Signed: 04/22/2023 11:36:48 AM By: Duanne Guess MD FACS Entered By: Duanne Guess on 04/22/2023 08:36:48 -------------------------------------------------------------------------------- Progress Note Details Patient Name: Date of Service: [name] 04/22/2023 11:00 A M Medical Record Number: [id number] Patient Account Number: [id number] Date of Birth/Sex: Treating RN: [birth year] (72 y.o. M) Primary Care Provider: [name] Other Clinician: Referring Provider: Treating Provider/Extender: [name] in Treatment: 22 Subjective Chief Complaint Information obtained from Patient Patient seen for complaints of Non-Healing Wounds. History of Present Illness (HPI) ADMISSION 02/09/2022 This is a 72 year old man who recently moved to [place] from [place]. He has a history of of coronary artery disease and prior left popliteal vein DVT . He is not diabetic and does not smoke. 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 He has had multiple spinal surgeries and has limited mobility. He primarily uses a motorized wheelchair. He has had lymphedema for many years and does have lymphedema pumps  although he says he does not use them frequently. He has wounds on his bilateral lower extremities. He was receiving care in [place] for these prior to his move. They were apparently packing his wounds with Betadine soaked gauze. He has worn compression wraps in the past but not recently. He did say that they helped tremendously. In clinic today, his right ABI is noncompressible but has good signals; the left ABI was 1.17. No formal vascular studies have been performed. On his left dorsal foot, there is a circular lesion that exposes the fat layer. There is fairly heavy slough accumulation within the wound, but no significant odor. On his right medial calf, he has multiple pinholes that have slough buildup in them and probe for about 2 to 4 mm in depth. They are draining clear fluid. On his right lateral midfoot, he has ulceration consistent with venous stasis. It is geographic and has slough accumulation. 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 02/17/2022: All of the wounds are little bit larger today and have more slough accumulation. Today, the medial calf openings are larger and probe to something that feels calcified. There is odor coming from his wounds. His vascular studies are scheduled for August 9 and 10. 02-24-2022 upon evaluation today patient presents for follow-up concerning his ongoing issues here with his lower extremities. With that being said he does have significant problems with what appears to be cellulitis unfortunately the PCR culture that was  sent last week got crushed by [employer] in transit. With that being said we are going to reobtain a culture today although I am actually to do it on the right medial leg as this seems to be the most inflamed area today where there is some questionable drainage as well. I am going to remove some of the calcium deposits which are loosening obtain a deeper  culture from this area. Fortunately I do not see any evidence of active infection systemically which is good news but locally I think we are having a bigger issue here. He does have his appointment next Thursday with vascular. Patient has also been referred to [facility] for further evaluation and treatment of the calcinosis for possible sulfate injections. 03/04/2022: The culture that was performed last week grew out multiple species including Klebsiella, Morganella, and Pseudomonas aeruginosa. He had been prescribed Bactrim but this was not adequate to cover all of the species and levofloxacin was recommended. He completed the Bactrim but did not pick up the levofloxacin and begin taking it until yesterday. 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 He had arterial studies performed today. He does have evidence of peripheral arterial disease. Results copied here: +-------+-----------+-----------+------------+------------+ ABI/TBIT oday's ABIT oday's TBIPrevious ABIPrevious TBI +-------+-----------+-----------+------------+------------+ Right [place] 0.75    +-------+-----------+-----------+------------+------------+ Left [place] 0.57    +-------+-----------+-----------+------------+------------+ [name] ([id number]) [id number] Page 9 of 16 Arterial wall calcification precludes accurate ankle pressures and ABIs. Summary: Right: Resting right ankle-brachial index indicates noncompressible right lower extremity arteries. The right toe-brachial index is normal. Left: Resting left ankle-brachial index indicates noncompressible left lower extremity arteries. The left toe-brachial index is abnormal. He continues to have further tissue breakdown at the right medial calf and there is ample  slough accumulation along with necrotic subcutaneous tissue. The left dorsal foot wound has built up slough, as well. The right medial foot wound is nearly closed with just a light layer of eschar over the surface. The right dorsal lateral foot wound has also accumulated a fair amount of slough and remains quite tender. 03/10/2022: He has been taking the prescribed levofloxacin and has about 3 days left of this. The erythema and induration on his right leg has improved. He continues to experience further breakdown of the right medial calf wound. There is extensive slough and debris accumulation there. There is heavy slough on his left dorsal foot wound, but no odor or purulent drainage. The right medial foot wound appears to be closed. The right dorsal lateral foot wound also has a fair amount of slough but it is less tender than at our last visit. He still does not have an appointment with dermatology. 03/22/2022: The right anterior tibial wound has closed. The right lateral foot wound is nearly closed with just a little bit of slough. The left dorsal foot wound is smaller and continues to have some slough buildup but less so than at prior visits. There is good granulation tissue underlying the slough. Unfortunately the right medial calf wound continues to deteriorate. It has a foul odor today and a lot of necrotic tissue, the surrounding skin is also erythematous and moist. 03/30/2022: The right lateral foot wound continues to contract and just has a little bit of slough and eschar present. The left dorsal foot wound is also smaller with slough overlying good granulation tissue. The right medial calf wound actually  looks quite a bit better today; there is no odor and there is much less necrotic tissue although some remains present. We have been using topical gentamicin and he has been taking oral Augmentin. 04/07/2022: The patient saw dermatology at [facility] last week.  Unfortunately, it appears that they did not read any of the documentation that was provided. 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 Overall, it was a highly unsatisfactory consultation. In the meantime, however, we were able to identify compounding pharmacy who could create a topical sodium thiosulfate ointment. The patient has that with him today. Both of the right foot wounds are now closed. The left dorsal foot wound has contracted but still has a layer of slough on the surface. The medial calf wounds on the right all look a little bit better. They still have heavy slough along with the chunks of calcium. Everything is stained purple. 04/14/2022: The wound on his dorsal left foot is a little bit smaller again today. There is still slough accumulation on the surface. The medial calf wounds have quite a bit less slough accumulation today. His right leg, however, is warm and erythematous, concerning for cellulitis. 04/14/2022: He completed his course of Augmentin yesterday. The medial calf wound sites are much cleaner today and shallower. There is less fragmented calcium in the wounds. He has a lot of lymphedema fluid-like drainage from these wounds, but no purulent discharge or malodor. The wound on his dorsal left foot has also contracted and is shallower. There is a layer of slough over healthy granulation tissue. 05/05/2022: The left dorsal foot wound is smaller today with less slough and more granulation tissue. The right medial calf wounds are shallower, but have extensive slough and some fat necrosis. The drainage has diminished considerably, however. 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 05/12/2022: The left dorsal foot wound continues to contract and fill with granulation tissue. There is slough on the wound surface. The right medial calf wounds also continue to fill with healthy tissue, but there is remaining slough and fat necrosis present. He had a significant increase in his drainage this week and it was a bright yellow-green color. He also had a new wound open over his right anterior tibial surface associated with the spike of cutaneous calcium. The leg is more red, as well, concerning for infection. 05/19/2022: His culture returned positive for Pseudomonas. He is currently taking a course of oral levofloxacin. We have also been using topical gentamicin mixed in with his sodium thiosulfate. All of the wounds look better this week. 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 There is slough and nonviable subcutaneous tissue still present. The new anterior tibial wound just has a light layer of slough. The dorsal foot wound also has some slough present and appears a little dry today. 05/26/2022: The right medial leg wound continues to improve. It is feeling with granulation tissue, but still accumulates a fairly thick layer of slough on the surface. The more recent anterior tibial wound has remained quite small, but also has some slough on the surface. The left dorsal foot wound has contracted somewhat  and has perimeter epithelialization. He completed his oral levofloxacin. 06/02/2022: The left dorsal foot wound continues to improve significantly. There is just a little slough on the wound surface. The right medial leg wound had black drainage, but it looks like silver alginate was applied last week and I theorized that silver sulfide was created with a combination of the silver alginate and the sodium thiosulfate. It did, however, have a bit of an odor to it and extensive slough  accumulation. The small anterior tibial wound also had a bit of slough and nonviable subcutaneous tissue present. The skin of his leg was rather macerated, as well. 06/09/2022: The left dorsal foot wound is smaller again this week with just a light layer of slough on the surface. The right medial leg wound looks substantially better. The leg and periwound skin are no longer red and macerated. There is good granulation tissue forming with less slough and nonviable tissue. The anterior tibial wound just has a small amount of slough accumulation. 06/16/2022: The left dorsal foot wound continues to contract. His wrap slipped a little bit, however, and his foot is more swollen. The right medial leg wound continues to improve. The underlying tissue is more vital-appearing and there is less slough. He does have substantial drainage. The small anterior tibial wound has a bit of slough accumulation. 06/23/2022: The left dorsal foot wound is about half the size as it was last week. There is just some light slough on the surface and some eschar buildup around the edges. The right anterior leg wound is small with a little slough on the surface. Unfortunately, the right medial leg wound deteriorated fairly substantially. It is malodorous with gray/green/black discoloration. 06/30/2022: The left dorsal foot wound continues to contract. It is nearly closed with just a light layer of slough present. The right anterior leg wound is about the same size and has a small amount of slough on the surface. The right medial leg wound looks a lot better than it did last week. The odor has abated and the tissue is less pulpy and necrotic-looking. There is still a fairly thick layer of slough present. 12/13; the left dorsal foot wound looks very healthy. There was no need to change the dressing here and no debridement. He has a small area on the right anterior lower leg apparently had not changed much in size. The area on the  medial leg is the most problematic area this is large with some depth and a completely nonviable surface today. 07/14/2022: The left dorsal foot wound is smaller today with just a little slough and eschar present. The right anterior lower leg wound is about the same without any significant change. The medial leg wound has a black and green surface and is malodorous. No purulent drainage and the periwound skin is intact. Edema control is suboptimal. 07/21/2022: The dorsal foot wound continues to contract and is nearly closed with just a little bit of slough and eschar on the surface. The right anterior lower leg wound is shallower today but otherwise the dimensions are unchanged. The medial leg wound looks significantly better although it still has a nonviable surface; the odor and black/green surface has resolved. His culture came back with multiple species sensitive to Augmentin, which was prescribed and a very low level of Pseudomonas, which should be handled by the topical gentamicin we have been using. [name], [name] ([id number]) [id number] Page 10 of 16 07/28/2022: The dorsal foot wound is down to just a very tiny opening  with a little eschar on the surface. The right anterior tibial wound is about the same with some slough buildup. The right medial leg wound looks quite a bit better this week. There is thick rubbery slough on the surface, but the odor and drainage has improved significantly. 08/04/2022: The dorsal foot wound is almost healed, but the periwound skin has broken down. This appears to be secondary to moisture and adhesive from the absorbent pad. The right anterior tibial wound is shallower. The right medial leg wound continues to improve. It is smaller, still with rubbery slough on the surface. No malodor or purulent drainage. 08/11/2022: The dorsal foot wound is about the same size. It is a little bit dry with some slough accumulation. The right anterior  tibial wound is also unchanged. The right medial leg wound is filling in further with good granulation tissue. Still with some slough buildup on the surface. 08/18/2022: His left leg is bright red, hot, and swollen with cellulitis. The dorsal foot wound actually looks quite good and is superficial with just a little bit of slough accumulation. The right anterior tibial wound is unchanged. The right medial leg wound has filled in with good granulation tissue but has copious amounts of drainage. 08/25/2022: His cellulitis has responded nicely to the oral antibiotics. His dorsal foot wound is smaller but has a fair amount of slough buildup. The right anterior tibial wound remains stable and unchanged. The right medial leg wound has more granulation tissue under a layer of slough, but continues to have copious drainage. The drainage is actually causing skin irritation and threatens to result in breakdown. 09/01/2022: He continues to have profuse drainage from his wounds which is resulting in tissue maceration on both the dorsum of his left foot and the periwound distal to his medial leg wound. The dorsal foot wound is nearly healed, despite this. The medial right leg wound is filling in with good granulation tissue. We are working on getting home health assistance to change his dressings more frequently to try and control the drainage better. 09/08/2022: We have had success in controlling his drainage. The tissue maceration has improved significantly and his swelling has come down quite markedly. The medial right leg wound is much more superficial and has substantially less nonviable tissue present. The anterior tibial wound is a little bit larger, however with some nonviable tissue present. 09/15/2022: The dorsal foot wound is nearly closed. The anterior tibial wound measures slightly larger today. The medial right leg wound continues to improve quite dramatically with less nonviable tissue and more  robust-looking granulation. Edema control is significantly better. 09/22/2022: The left dorsal foot wound remains open, albeit quite tiny. The anterior tibial wound is a little bit larger again today. The medial right leg wound continues to improve with an increase in robust and healthy granulation tissue and less accumulation of nonviable tissue. 09/29/2022: His home health providers wrapped his left leg extremely tightly and he ended up having to cut the wrap off of his foot. This resulted in his foot being very swollen and that the dorsal foot wound is a little bit larger today. The anterior tibial wound is about the same size with a little slough on the surface. The medial right leg wound continues to fill in. There are very few areas with any significant depth. There is still slough accumulation. 3/13; Patient has a left dorsal foot wound along with a right anterior tibial wound and right medial wound. We are using silver alginate to  the left dorsal foot wound, Santyl and sodium thiosulfate compound ointment to the right lower extremity wounds under 4 layer compression. Patient has no issues or complaints today. 10/13/2022: The left dorsal foot wound is a little bit bigger today, but his foot is also a little bit more swollen. The right medial leg wound has filled in more and has substantially less depth. There is still some nonviable tissue present. The small anterior lower leg wound looks about the same. 10/20/2022: The left dorsal foot wound is epithelializing. There does not appear to be much open at this site. There is some eschar around the edges. The right medial leg wound continues to fill in with granulation tissue. The small anterior lower leg wound is a little bit bigger and there is a chunk of calcium protruding. He continues to have fairly copious drainage from the right leg. 10/27/2022: His home health agency failed to show up at all this past week and as a result, the excessive drainage  from his right leg wound has resulted in tissue breakdown. His leg is red and irritated. The wounds themselves look about the same. The wound on his left dorsal foot is nearly closed underneath some eschar. 11/03/2022: We continue to have difficulty with his home health agency. He continues to have significant drainage from his right leg wound which has resulted in even further tissue breakdown. He also was not taking his Lasix as prescribed and I think much of the drainage has been his lymphedema fluid choosing the path of least resistance. The wound on his left dorsal foot is closed, but in the process of cleaning the site, dry skin was pulled off and he has a new small superficial wound just adjacent to where the dorsal foot wound was. 11/12/2022: His left dorsal foot is completely healed. The right medial leg continues to deteriorate. He is not taking his Lasix as prescribed, nor is he using his lymphedema pumps at all. As a result, he has massive amounts of drainage saturating his dressings and causing further tissue breakdown on his medial leg. Today, his dressings had a foul odor, as well. 11/19/2022: His right medial leg looks even worse today. There is more erythema and moisture related tissue breakdown. The 2 anterior wounds look a little bit better. The culture that I took last week did produce Pseudomonas and he is currently taking levofloxacin for this. 11/26/2022: There has been significant improvement to his right medial leg. It is far less raw and many of the superficial open areas have epithelialized. He continues to cut the foot portion of his wrap and as result his foot is bulbous and swollen compared to the rest of his leg, where edema control is good. The smaller of the 2 anterior tibial wound is closed, while the larger is contracting somewhat with a bit of slough at the base. 12/03/2022: His leg continues to demonstrate improvement. There has been epithelialization of much of the  denuded area. He has a new small anterior tibial wound that has opened around a nidus of calcium. Edema control is improved. 12/10/2022: Continued improvement in the periwound skin. The anterior tibial wounds are about the same, but the medial leg wound has improved further. There is less nonviable tissue present. 12/17/2022: Between his visit on Tuesday with the nurse and today, he has had significantly more drainage which has resulted in some periwound tissue maceration. I suspect this correlates somewhat with his diuretic use, as he says he takes his diuretic religiously on  Saturday, Sunday, and Monday and then does not take it after Tuesday due to being out and about and not being close to easy restroom access. I suspect the excess fluid is just simply draining out of his leg. 12/21/2022: This was supposed to be a nurse visit, but his leg has broken down so significantly, that we have converted to a wound care visit. He did take his diuretics over the weekend, but despite this, he has had copious drainage and all of his tissues have broken down substantially from the right medial leg ulcer all the way down to his ankle and even wrapping posterior to this. There is blue-green staining on his dressing, suggesting ongoing presence of Pseudomonas aeruginosa. 12/24/2022: The wound already looks better than it did 2 days ago. He has been taking ciprofloxacin and the Urgo clean dressing we applied did a much better job of wicking the drainage away from his leg. There is slough on the entire surface of the medial leg wound, but the erythema and induration has improved in the past couple of days. 12/31/2022: Continued improvement of the wound. He says that the pain has basically abated since being on ciprofloxacin. Still with fairly heavy drainage and slough accumulation. [name], [name] ([id number]) [id number] Page 11 of 16 01/07/2023: Overall continued improvement. Still with heavy  slough buildup, but less drainage. 01/14/2023: There is a little bit of improvement. The skin is still looking a bit excoriated. He has a few more days left of ciprofloxacin. 01/21/2023: There has been significant improvement. The periwound skin is in much better condition. The ulcers are shallower and actually have some good granulation tissue present under some slough. 01/28/2023: The wound continues to improve. All of the open areas measured smaller today. There is slough accumulation on the surfaces. His drainage has decreased markedly. 02/04/2023: Continued improvement in the wound. Drainage remains significantly diminished. Slough accumulation is present but to a lesser extent. Periwound skin continues to improve. Edema control is good. 02/18/2023: The heavy drainage persists, although it is was not as significant today as it was at his nurse visit on Tuesday. The drainage is yellow and does not have the blue-green discoloration suggestive of Pseudomonas. The actual wounds on his leg are getting smaller, but the periwound skin is quite excoriated. 02/25/2023: His periwound skin is quite excoriated and he has a lot of clear yellow drainage. The main wounds are filling in further and epithelializing. 03/04/2023: The excoriation of the periwound skin has improved. Most of that area has dried up. The main wounds have less slough in them today. 03/11/2023: The main wounds have accumulated a little bit of slough. The periwound excoriation continues to improve. Unfortunately, he suffered a fall over the previous weekend and has abrasions on his knees bilaterally and on his right wrist and upper arm. In addition, the skin on his left lower leg apparently blistered and opened, leaving several superficial wounds on this extremity. He has been approved for the [facility] and Press wound VAC and has the equipment with him today. 03/18/2023: He has extensive excoriation to the periwound skin on the right medial lower leg.  The actual wounds themselves are smaller with slough accumulation. The top of his left foot has begun weeping again. Using the [facility] and Press wound VAC, he says that he through 4 canisters. He seems to be very fluid overloaded, without any significant dyspnea. 03/25/2023: The wound to his left anterior tibial surface has healed. The left dorsal foot wound has gotten smaller and  has a small area of hypertrophic granulation tissue present. The periwound skin on the right medial lower leg looks slightly better, but still appears excoriated. The actual wounds continue to contract but are still accumulating quite a bit of slough. The tiny right anterior leg wounds are contracting. 04/01/2023: The left dorsal foot wound got a little bit bigger this week. There is slough accumulation. The periwound skin on the right medial lower leg continues to improve. The main medial right lower leg wounds have a dark gray discoloration and there is a strong odor coming from them. The back of his right lower leg just above the ankle also has a little bit of new breakdown where he is draining edema fluid. The small anterior leg wounds are stable. 04/08/2023: Since starting antibiotics last week, there has been tremendous improvement. The left dorsal foot wound is nearly closed with just a little bit of eschar on the surface. The right medial lower leg is less excoriated and red and he has had substantially less drainage. The amount of slough on the open areas of his wounds has decreased markedly and there is no longer any odor. The back of his right lower leg that was newly open last week has dried up, but is not completely closed yet. 04/15/2023: The improvement continues with significantly less drainage, resulting in less excoriation and irritation. The back of his right lower leg is closed, as is the left dorsal foot wound. The open areas on the right medial leg are smaller. 04/22/2023: His drainage has diminished  significantly. The tissue maceration and excoriation surrounding the right medial leg wound has also improved substantially. There is slough on all of the open wound surfaces. He does have a bit of an odor coming from his wound today, but there is no purulent drainage nor any significant discoloration of the drainage in his VAC. Patient History Information obtained from Patient, Chart. Family History Cancer - Mother, Heart Disease - Father,Mother, Hypertension - Father,Siblings, Lung Disease - Siblings, No family history of Diabetes, Hereditary Spherocytosis, Kidney Disease, Seizures, Stroke, Thyroid Problems, Tuberculosis. Social History Former smoker - quit 1991, Marital Status - Married, Alcohol Use - Moderate, Drug Use - No History, Caffeine Use - Daily - coffee. Medical History Eyes Patient has history of Cataracts - right eye removed Denies history of Glaucoma, Optic Neuritis Ear/Nose/Mouth/Throat Denies history of Chronic sinus problems/congestion, Middle ear problems Hematologic/Lymphatic Patient has history of Anemia - macrocytic, Lymphedema Cardiovascular Patient has history of Congestive Heart Failure, Deep Vein Thrombosis - left leg, Hypertension, Peripheral Venous Disease Endocrine Denies history of Type I Diabetes, Type II Diabetes Genitourinary Denies history of End Stage Renal Disease Integumentary (Skin) Denies history of History of Burn Oncologic Denies history of Received Chemotherapy, Received Radiation Psychiatric Denies history of Anorexia/bulimia, Confinement Anxiety Hospitalization/Surgery History - cervical fusion. - lumbar surgery. - coronary stent placement. - lasik eye surgery. - hydrocele excision. Medical A Surgical History Notes nd Constitutional Symptoms (General Health) obesity Ear/Nose/Mouth/Throat hard of hearing Respiratory frozen diaphragm right Cardiovascular hyperlipidemia [name], [name] ([id number]) [id number]  Page 12 of 16 Musculoskeletal myositis, lumbar DDD, spinal stenosis, cervical facet joint syndrome Objective Constitutional no acute distress. Vitals Time Taken: 11:06 AM, Height: 71 in, Weight: 267 lbs, BMI: 37.2, Temperature: 98.8 F, Pulse: 69 bpm, Respiratory Rate: 18 breaths/min, Blood Pressure: 129/81 mmHg. Respiratory Normal work of breathing on room air. General Notes: 04/22/2023: His drainage has diminished significantly. The tissue maceration and excoriation surrounding the right medial leg wound has also improved  substantially. There is slough on all of the open wound surfaces. He does have a bit of an odor coming from his wound today, but there is no purulent drainage nor any significant discoloration of the drainage in his VAC. Integumentary (Hair, Skin) Wound #3 status is Open. Original cause of wound was Gradually Appeared. The date acquired was: 12/07/2021. The wound has been in treatment 62 weeks. The wound is located on the Right,Medial Lower Leg. The wound measures 8.6cm length x 6.3cm width x 0.3cm depth; 42.553cm^2 area and 12.766cm^3 volume. There is Fat Layer (Subcutaneous Tissue) exposed. There is no tunneling or undermining noted. There is a medium amount of serosanguineous drainage noted. The wound margin is indistinct and nonvisible. There is medium (34-66%) red granulation within the wound bed. There is a medium (34-66%) amount of necrotic tissue within the wound bed including Adherent Slough. The periwound skin appearance had no abnormalities noted for texture. The periwound skin appearance had no abnormalities noted for moisture. The periwound skin appearance exhibited: Rubor. The periwound skin appearance did not exhibit: Erythema. Periwound temperature was noted as No Abnormality. The periwound has tenderness on palpation. Wound #6 status is Open. Original cause of wound was Gradually Appeared. The date acquired was: 05/12/2022. The wound has been in treatment 49  weeks. The wound is located on the Right,Anterior Lower Leg. The wound measures 1.5cm length x 0.4cm width x 0.2cm depth; 0.471cm^2 area and 0.094cm^3 volume. There is Fat Layer (Subcutaneous Tissue) exposed. There is no tunneling or undermining noted. There is a medium amount of serosanguineous drainage noted. The wound margin is epibole. There is no granulation within the wound bed. There is no necrotic tissue within the wound bed. The periwound skin appearance had no abnormalities noted for texture. The periwound skin appearance had no abnormalities noted for moisture. The periwound skin appearance exhibited: Hemosiderin Staining. Periwound temperature was noted as No Abnormality. Wound #8 status is Healed - Epithelialized. Original cause of wound was Gradually Appeared. The date acquired was: 03/18/2023. The wound has been in treatment 5 weeks. The wound is located on the Left,Dorsal Foot. The wound measures 0cm length x 0cm width x 0cm depth; 0cm^2 area and 0cm^3 volume. There is no tunneling or undermining noted. There is a none present amount of drainage noted. The wound margin is flat and intact. There is no granulation within the wound bed. There is no necrotic tissue within the wound bed. The periwound skin appearance had no abnormalities noted for texture. The periwound skin appearance had no abnormalities noted for moisture. The periwound skin appearance had no abnormalities noted for color. Periwound temperature was noted as No Abnormality. 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 There was a Selective/Open Wound Non-Viable Tissue Debridement with a total area of 21.27 sq cm performed by Duanne Guess, MD. With the following instrument(s): Curette to remove Non-Viable tissue/material. Material removed includes [facility] after achieving pain control using Lidocaine 4% Topical Solution. No specimens were taken. A time out was conducted at 11:20, prior to the start of the procedure. A Minimum amount of bleeding was controlled with Pressure. The procedure was tolerated well with a pain  level of 0 throughout and a pain level of 0 following the procedure. Post Debridement Measurements: 8.6cm length x 6.3cm width x 0.4cm depth; 17.021cm^3 volume. Character of Wound/Ulcer Post Debridement is improved. Post procedure Diagnosis Wound #3: Same as Pre-Procedure Pre-procedure diagnosis of Wound #3 is a Lymphedema located on the Right,Medial Lower Leg . There was a Three Layer Compression Therapy Procedure by [name] ([id number]) [id number] Page [address] of [address], [place]. 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 There was a Three Layer Compression Therapy Procedure by Zenaida Deed, RN. Post procedure Diagnosis Wound #6: Same as Pre-Procedure Plan Follow-up Appointments: Return Appointment in 1 week. - Dr. [name] RM 1 Friday 10/4 @ 11:00 am Nurse Visit: - Tuesday 10/1 @ 3:00 pm Anesthetic: (In clinic) Topical Lidocaine 4% applied to wound bed Bathing/ Shower/ Hygiene: May shower with protection but do not get wound dressing(s) wet. Protect dressing(s) with water repellant cover (for example, large plastic bag) or a cast cover and may then take shower. Negative Presssure Wound Therapy: Wound Vac to wound continuously at 129mm/hg pressure Black Foam - peel and place Edema  Control - Lymphedema / SCD / Other: Lymphedema Pumps. Use Lymphedema pumps on leg(s) 2-3 times a day for 45-60 minutes. If wearing any wraps or hose, do not remove them. Continue exercising as instructed. Elevate legs to the level of the heart or above for 30 minutes daily and/or when sitting for 3-4 times a day throughout the day. - throughout the day Avoid standing for long periods of time. Exercise regularly WOUND #3: - Lower Leg Wound Laterality: Right, Medial Peri-Wound Care: Skin Prep 1 x Per Week/30 Days Discharge Instructions: cavelon to macerated wound margins Peri-Wound Care: Sween Lotion (Moisturizing lotion) 1 x Per Week/30 Days Discharge Instructions: Apply moisturizing lotion as directed Topical: Gentamicin 1 x Per Week/30 Days Discharge Instructions: to open areas Topical: Mupirocin Ointment 1 x Per Week/30 Days Discharge Instructions: Apply Mupirocin (Bactroban) as instructed Prim Dressing: Maxorb Extra Ag+ Alginate Dressing, 4x4.75 (in/in) 1 x Per Week/30 Days ary Discharge Instructions: Apply to excoriated weeping areas Prim Dressing: peel and place vac 1 x Per Week/30 Days ary Com pression Wrap: ThreePress (3 layer compression wrap) 1 x Per Week/30 Days Discharge Instructions: Apply three layer compression as directed. WOUND #6: - Lower Leg Wound Laterality: Right, Anterior Peri-Wound Care: Sween Lotion (Moisturizing lotion) 1 x Per Week/30 Days Discharge Instructions: Apply moisturizing lotion as directed Prim Dressing: peel and place vac 1 x Per Week/30 Days ary Com pression Wrap: ThreePress (3 layer compression wrap) 1 x Per Week/30 Days Discharge Instructions: Apply three layer compression as directed. 04/22/2023: His drainage has diminished significantly. The tissue maceration and excoriation surrounding the right medial leg wound has also improved substantially. There is slough on all of the open wound surfaces. He does have a bit of an odor coming from his wound  today, but there is no purulent drainage nor any significant discoloration of the drainage in his VAC. I used a curette to debride slough from all of the open wound surfaces. We will apply topical gentamicin and mupirocin to the open wounds and cover the periwound with silver alginate to absorb any drainage that escaped the wound VAC. Continue negative pressure wound therapy. He continues to take oral levofloxacin. Follow-up in one 1 week. Electronic Signature(s) Signed: 04/22/2023 11:44:39 AM By: Duanne Guess MD FACS Entered By:  Duanne Guess on 04/22/2023 08:44:38 -------------------------------------------------------------------------------- HxROS Details Patient Name: Date of Service: [name], [name] 04/22/2023 11:00 A M Medical Record Number: [id number] Patient Account Number: [id number] Date of Birth/Sex: Treating RN: 24-Sep-1950 (72 y.o. M) Primary Care Provider: [name] Other Clinician: Jackson [name] ([id number]) [id number] Page 14 of 16 Referring Provider: Treating Provider/Extender: [name] in Treatment: 62 Information Obtained From Patient Chart Constitutional Symptoms (General Health) Medical History: Past Medical History Notes: obesity Eyes Medical History: Positive for: Cataracts - right eye removed Negative for: Glaucoma; Optic Neuritis Ear/Nose/Mouth/Throat Medical History: Negative for: Chronic sinus problems/congestion; Middle ear problems Past Medical History Notes: hard of hearing Hematologic/Lymphatic Medical History: Positive for: Anemia - macrocytic; Lymphedema Respiratory Medical History: Past Medical History Notes: frozen diaphragm right Cardiovascular Medical History: Positive for: Congestive Heart Failure; Deep Vein Thrombosis - left leg; Hypertension; Peripheral Venous Disease Past Medical History Notes: hyperlipidemia Endocrine Medical History: Negative for: Type I  Diabetes; Type II Diabetes Genitourinary Medical History: Negative for: End Stage Renal Disease Integumentary (Skin) Medical History: Negative for: History of Burn Musculoskeletal Medical History: Past Medical History Notes: myositis, lumbar DDD, spinal stenosis, cervical facet joint syndrome Oncologic Medical History: Negative for: Received Chemotherapy; Received Radiation Psychiatric Medical History: Negative for: Anorexia/bulimia; Confinement Anxiety HBO Extended History Items Eyes: Cataracts [name], JARNIGAN ([id number]) [id number] Page 15 of 16 Immunizations Pneumococcal Vaccine: Received Pneumococcal Vaccination: Yes Received Pneumococcal Vaccination On or After 60th Birthday: Yes Implantable Devices No devices added Hospitalization / Surgery History Type of Hospitalization/Surgery cervical fusion lumbar surgery coronary stent placement lasik eye surgery hydrocele excision Family and Social History Cancer: Yes - Mother; Diabetes: No; Heart Disease: Yes - Father,Mother; Hereditary Spherocytosis: No; Hypertension: Yes - Father,Siblings; Kidney Disease: No; Lung Disease: Yes - Siblings; Seizures: No; Stroke: No; Thyroid Problems: No; Tuberculosis: No; Former smoker - quit 1991; Marital Status - Married; Alcohol Use: Moderate; Drug Use: No History; Caffeine Use: Daily - coffee; Financial Concerns: No; Food, Clothing or Shelter Needs: No; Support System Lacking: No; Transportation Concerns: No Electronic Signature(s) Signed: 04/25/2023 9:44:25 AM By: Duanne Guess MD FACS Entered By: Duanne Guess on 04/22/2023 08:41:37 -------------------------------------------------------------------------------- SuperBill Details Patient Name: Date of Service: MANCEL, LARDIZABAL 04/22/2023 Medical Record Number: [id number] Patient Account Number: [id number] Date of Birth/Sex: Treating RN: [birth year] (72 y.o. 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 Venous insufficiency (chronic) (peripheral) I89.0 Lymphedema, not elsewhere classified Z86.718 Personal history of other venous thrombosis and embolism I10 Essential (primary) hypertension I25.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 angina pectoris Facility Procedures : CPT4 Code: [id number] Description: 97597 - DEBRIDE WOUND 1ST 20 SQ CM OR < ICD-10 Diagnosis Description 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 Modifier: ed Quantity: 1 : CPT4 Code: [id number] Description: 97598 - DEBRIDE WOUND EA ADDL 20 SQ CM ICD-10 Diagnosis Description 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 Modifier: ed Quantity: 1 Physician Procedures : CPT4 Code Description Modifier [id number] 99214 - WC PHYS LEVEL 4 - EST PT 25 ICD-10 Diagnosis Description YOUCEF, KLAS ([id number]) [id number] L97.812 Non-pressure chronic ulcer of other part of right lower leg with fat layer  exposed 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 Venous insufficiency (chronic) (peripheral) I89.0 Lymphedema, not  elsewhere classified Quantity: 1 7.pdf Page 16 of 16 : [id number] 97597 - WC PHYS DEBR WO ANESTH 20 SQ CM 1 ICD-10 Diagnosis Description 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ed Quantity: : 2025427 97598 - WC PHYS DEBR WO ANESTH EA ADD 20 CM 1 ICD-10 Diagnosis Description 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ed Quantity: Electronic Signature(s) Signed: 04/22/2023 11:45:15 AM By: Duanne Guess MD FACS Entered By: Duanne Guess on 04/22/2023 08:45:14

## 2023-04-26 ENCOUNTER — Encounter (HOSPITAL_BASED_OUTPATIENT_CLINIC_OR_DEPARTMENT_OTHER): Payer: Medicare Other | Attending: General Surgery | Admitting: General Surgery

## 2023-04-26 DIAGNOSIS — L97812 Non-pressure chronic ulcer of other part of right lower leg with fat layer exposed: Secondary | ICD-10-CM | POA: Diagnosis not present

## 2023-04-26 DIAGNOSIS — I110 Hypertensive heart disease with heart failure: Secondary | ICD-10-CM | POA: Insufficient documentation

## 2023-04-26 DIAGNOSIS — I739 Peripheral vascular disease, unspecified: Secondary | ICD-10-CM | POA: Diagnosis not present

## 2023-04-26 DIAGNOSIS — E785 Hyperlipidemia, unspecified: Secondary | ICD-10-CM | POA: Diagnosis present

## 2023-04-26 DIAGNOSIS — I509 Heart failure, unspecified: Secondary | ICD-10-CM | POA: Diagnosis not present

## 2023-04-26 DIAGNOSIS — D649 Anemia, unspecified: Secondary | ICD-10-CM | POA: Diagnosis not present

## 2023-04-26 DIAGNOSIS — Z86718 Personal history of other venous thrombosis and embolism: Secondary | ICD-10-CM | POA: Diagnosis not present

## 2023-04-26 DIAGNOSIS — I2510 Atherosclerotic heart disease of native coronary artery without angina pectoris: Secondary | ICD-10-CM | POA: Diagnosis not present

## 2023-04-26 DIAGNOSIS — L97822 Non-pressure chronic ulcer of other part of left lower leg with fat layer exposed: Secondary | ICD-10-CM | POA: Insufficient documentation

## 2023-04-26 DIAGNOSIS — I890 Lymphedema, not elsewhere classified: Secondary | ICD-10-CM | POA: Diagnosis not present

## 2023-04-26 DIAGNOSIS — I872 Venous insufficiency (chronic) (peripheral): Secondary | ICD-10-CM | POA: Insufficient documentation

## 2023-04-26 NOTE — Progress Notes (Signed)
KESSLER, SOLLY ([id number]) [id number] Page 1 of 5 Visit Report for 04/26/2023 Arrival Information Details Patient Name: Date of Service: [name], [name] 04/26/2023 3:00 PM Medical Record Number: [id number] Patient Account Number: [id number] Date of Birth/Sex: Treating RN: February 13, 1951 (72 y.o. [name] Primary Care Laretha Luepke: [name] Other Clinician: Referring Lanie Schelling: Treating Amato Sevillano/Extender: [name] in Treatment: 58 Visit Information History Since Last Visit Added or deleted any medications: No Patient Arrived: Wheel Chair Any new allergies or adverse reactions: No Arrival Time: 15:04 Had a fall or experienced change in No Accompanied By: self activities of daily living that may affect Transfer Assistance: Manual risk of falls: Patient Identification Verified: Yes Signs or symptoms of abuse/neglect since last visito No Secondary Verification Process Completed: Yes Hospitalized since last visit: No Patient Requires Transmission-Based Precautions: No Implantable device outside of the clinic excluding No Patient Has Alerts: Yes cellular tissue based products placed in the center Patient Alerts: R ABI= N/C, TBI= .75 since last visit: L ABI =N/C, TBI = .57 Has Dressing in Place as Prescribed: Yes Has Compression in Place as Prescribed: Yes Pain Present Now: No Electronic Signature(s) Signed: 04/26/2023 3:42:47 PM By: [name] Entered By: [name] on 04/26/2023 15:05:11 -------------------------------------------------------------------------------- Compression Therapy Details Patient Name: Date of Service: [name], [name] 04/26/2023 3:00 PM Medical Record Number: [id number] Patient Account Number: [id number] Date of Birth/Sex: Treating RN: 05-Dec-1950 (72 y.o. [name] Primary Care Ripley Bogosian: [name] Other Clinician: Referring Jorryn Casagrande: Treating Daylan Boggess/Extender:  [name] in Treatment: 29 Compression Therapy Performed for Wound Assessment: Wound #3 Right,Medial Lower Leg Performed By: Clinician [name], RN Compression Type: Three Layer Electronic Signature(s) Signed: 04/26/2023 3:42:47 PM By: [name] Entered By: [name] on 04/26/2023 15:37:08 -------------------------------------------------------------------------------- Compression Therapy Details Patient Name: Date of Service: TAHEEM, FRICKE [name] 04/26/2023 3:00 PM Medical Record Number: [id number] Patient Account Number: [id number] [name], [name] ([id number]) [phone].pdf Page 2 of 5 Date of Birth/Sex: Treating RN: 09/15/1950 (73 y.o. [name] Primary Care Yumi Insalaco: Other Clinician: [name] Referring Ruchama Kubicek: Treating Shy Guallpa/Extender: [name] in Treatment: 29 Compression Therapy Performed for Wound Assessment: NonWound Condition Lymphedema - Left Leg Performed By: Clinician [name], RN Compression Type: Three Layer Electronic Signature(s) Signed: 04/26/2023 3:42:47 PM By: [name] Entered By: [name] on 04/26/2023 15:37:19 -------------------------------------------------------------------------------- Encounter Discharge Information Details Patient Name: Date of Service: [name], [name] [name] 04/26/2023 3:00 PM Medical Record Number: [id number] Patient Account Number: [id number] Date of Birth/Sex: Treating RN: 29-Oct-1950 (72 y.o. [name] Primary Care Idella Lamontagne: [name] Other Clinician: Referring Kacee Sukhu: Treating Hazael Olveda/Extender: [name] in Treatment: 79 Encounter Discharge Information Items Discharge Condition: Stable Ambulatory Status: Wheelchair Discharge Destination: Home Transportation: Private Auto Accompanied By: self Schedule Follow-up Appointment: Yes Clinical  Summary of Care: Patient Declined Electronic Signature(s) Signed: 04/26/2023 3:42:47 PM By: Gelene Mink By: [name] on 04/26/2023 15:37:51 -------------------------------------------------------------------------------- Patient/Caregiver Education Details Patient Name: Date of Service: [name] 10/1/2024andnbsp3:00 PM Medical Record Number: [id number] Patient Account Number: [id number] Date of Birth/Gender: Treating RN: 1950-09-28 (72 y.o. [name] Primary Care Physician: [name] Other Clinician: Referring Physician: Treating Physician/Extender: [name] in Treatment: 59 Education Assessment Education Provided To: Patient Education Topics Provided Wound/Skin Impairment: Methods: Explain/Verbal Responses: Reinforcements needed, State content correctly [employer]) Staley, [name] ([id number]) [id number] Page 3 of 5 Signed: 04/26/2023 3:42:47 PM By: [name] Entered By: [name] on 04/26/2023 15:37:38 -------------------------------------------------------------------------------- Wound Assessment  Details Patient Name: Date of Service: [name], [name] 04/26/2023 3:00 PM Medical Record Number: [id number] Patient Account Number: [id number] Date of Birth/Sex: Treating RN: [birth year] (72 y.o. [name] Primary Care Romana Deaton: [name] Other Clinician: Referring Shaunte Weissinger: Treating Tonee Silverstein/Extender: [name] in Treatment: 63 Wound Status Wound Number: 3 Primary Lymphedema Etiology: Wound Location: Right, Medial Lower Leg Wound Open Wounding Event: Gradually Appeared Status: Date Acquired: 12/07/2021 Comorbid Cataracts, Anemia, Lymphedema, Congestive Heart Failure, Deep Weeks Of Treatment: 63 History: Vein Thrombosis, Hypertension, Peripheral Venous Disease Clustered Wound: No Wound  Measurements Length: (cm) 8.6 Width: (cm) 6.3 Depth: (cm) 0.3 Area: (cm) 42.553 Volume: (cm) 12.766 % Reduction in Area: -696.7% % Reduction in Volume: -497.7% Epithelialization: Small (1-33%) Wound Description Classification: Full Thickness With Exposed Support Structures Wound Margin: Indistinct, nonvisible Exudate Amount: Medium Exudate Type: Serosanguineous Exudate Color: red, brown Foul Odor After Cleansing: No Slough/Fibrino Yes Wound Bed Granulation Amount: Medium (34-66%) Exposed Structure Granulation Quality: Red Fascia Exposed: No Necrotic Amount: Medium (34-66%) Fat Layer (Subcutaneous Tissue) Exposed: Yes Necrotic Quality: Adherent Slough Tendon Exposed: No Muscle Exposed: No Joint Exposed: No Bone Exposed: No Periwound Skin Texture Texture Color No Abnormalities Noted: Yes No Abnormalities Noted: No Erythema: No Moisture Rubor: Yes No Abnormalities Noted: Yes Temperature / Pain Temperature: No Abnormality Tenderness on Palpation: Yes Treatment Notes Wound #3 (Lower Leg) Wound Laterality: Right, Medial Cleanser Peri-Wound Care Skin Prep Discharge Instruction: cavelon to macerated wound margins Sween Lotion (Moisturizing lotion) Discharge Instruction: Apply moisturizing lotion as directed Topical DAVEON, ARPINO ([id number]) [id number] Page 4 of 5 Gentamicin Discharge Instruction: to open areas Mupirocin Ointment Discharge Instruction: Apply Mupirocin (Bactroban) as instructed Primary Dressing Maxorb Extra Ag+ Alginate Dressing, 4x4.75 (in/in) Discharge Instruction: Apply to excoriated weeping areas peel and place vac Secondary Dressing Secured With Compression Wrap ThreePress (3 layer compression wrap) Discharge Instruction: Apply three layer compression as directed. Compression Stockings Add-Ons Notes verbal order per Dr. [name] to wrap the left leg in a 3 layer compression wrap Electronic Signature(s) Signed:  04/26/2023 3:42:47 PM By: [name] Entered By: [name] on 04/26/2023 15:36:40 -------------------------------------------------------------------------------- Wound Assessment Details Patient Name: Date of Service: KENSHAWN, MACIOLEK 04/26/2023 3:00 PM Medical Record Number: [id number] Patient Account Number: [id number] Date of Birth/Sex: Treating RN: 17-Apr-1951 (72 y.o. [name] Primary Care Koston Hennes: [name] Other Clinician: Referring Jiyah Torpey: Treating Aili Casillas/Extender: [name] in Treatment: 63 Wound Status Wound Number: 6 Primary Calcinosis Etiology: Wound Location: Right, Anterior Lower Leg Wound Open Wounding Event: Gradually Appeared Status: Date Acquired: 05/12/2022 Comorbid Cataracts, Anemia, Lymphedema, Congestive Heart Failure, Deep Weeks Of Treatment: 49 History: Vein Thrombosis, Hypertension, Peripheral Venous Disease Clustered Wound: Yes Wound Measurements Length: (cm) Width: (cm) Depth: (cm) Clustered Quantity: Area: (cm) Volume: (cm) 1.5 % Reduction in Area: -99.6% 0.4 % Reduction in Volume: -291.7% 0.2 Epithelialization: Large (67-100%) 2 0.471 0.094 Wound Description Classification: Full Thickness Without Exposed Sup Wound Margin: Epibole Exudate Amount: Medium Exudate Type: Serosanguineous Exudate Color: red, brown port Structures Foul Odor After Cleansing: No Slough/Fibrino Yes Wound Bed Granulation Amount: None Present (0%) Exposed Structure Necrotic Amount: None Present (0%) Fascia Exposed: No Fat Layer (Subcutaneous Tissue) Exposed: Yes Tendon Exposed: No Muscle Exposed: No Dames, [name] ([id number]) [id number] Page 5 of 5 Joint Exposed: No Bone Exposed: No Periwound Skin Texture Texture Color No Abnormalities Noted: Yes No Abnormalities Noted: No Hemosiderin Staining: Yes Moisture No Abnormalities Noted: Yes Temperature / Pain Temperature:  No Abnormality Treatment Notes Wound #6 (Lower  Leg) Wound Laterality: Right, Anterior Cleanser Peri-Wound Care Sween Lotion (Moisturizing lotion) Discharge Instruction: Apply moisturizing lotion as directed Topical Primary Dressing peel and place vac Secondary Dressing Secured With Compression Wrap ThreePress (3 layer compression wrap) Discharge Instruction: Apply three layer compression as directed. Compression Stockings Add-Ons Notes verbal order per Dr. [name] to wrap the left leg in a 3 layer compression wrap Electronic Signature(s) Signed: 04/26/2023 3:42:47 PM By: [name] Entered By: [name] on 04/26/2023 15:36:50

## 2023-04-27 NOTE — Progress Notes (Signed)
[name], [name] ([id number]) [id number] Page 1 of 1 Visit Report for 04/26/2023 SuperBill Details Patient Name: Date of Service: [name], [name] 04/26/2023 Medical Record Number: [id number] Patient Account Number: [id number] Date of Birth/Sex: Treating RN: Dec 07, 1950 (72 y.o. 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 ICD-10 Diagnosis Description 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ed I89.0 Lymphedema, not elsewhere classified Electronic Signature(s) Signed: 04/26/2023 3:42:47 PM By: [name] Signed: 04/27/2023 9:26:20 AM By: Duanne Guess MD FACS Entered By: [name] on 04/26/2023 15:38:03

## 2023-04-29 ENCOUNTER — Encounter (HOSPITAL_BASED_OUTPATIENT_CLINIC_OR_DEPARTMENT_OTHER): Payer: Medicare Other | Admitting: General Surgery

## 2023-04-29 DIAGNOSIS — I110 Hypertensive heart disease with heart failure: Secondary | ICD-10-CM | POA: Diagnosis not present

## 2023-04-29 DIAGNOSIS — I739 Peripheral vascular disease, unspecified: Secondary | ICD-10-CM | POA: Diagnosis not present

## 2023-04-29 DIAGNOSIS — L97812 Non-pressure chronic ulcer of other part of right lower leg with fat layer exposed: Secondary | ICD-10-CM | POA: Diagnosis not present

## 2023-04-29 DIAGNOSIS — I890 Lymphedema, not elsewhere classified: Secondary | ICD-10-CM | POA: Diagnosis not present

## 2023-04-29 DIAGNOSIS — I509 Heart failure, unspecified: Secondary | ICD-10-CM | POA: Diagnosis not present

## 2023-04-29 DIAGNOSIS — L97822 Non-pressure chronic ulcer of other part of left lower leg with fat layer exposed: Secondary | ICD-10-CM | POA: Diagnosis not present

## 2023-04-29 DIAGNOSIS — D649 Anemia, unspecified: Secondary | ICD-10-CM | POA: Diagnosis not present

## 2023-04-29 NOTE — Progress Notes (Signed)
MICHOEL, [name] ([id number]) [id number] Page 1 of 17 Visit Report for 04/29/2023 Chief Complaint Document Details Patient Name: Date of Service: [name], [name] 04/29/2023 11:00 A M Medical Record Number: [id number] Patient Account Number: [id number] Date of Birth/Sex: Treating RN: 1951-02-28 (72 y.o. M) Primary Care Provider: [name] Other Clinician: Referring Provider: Treating Provider/Extender: [name] in Treatment: 44 Information Obtained from: Patient Chief Complaint Patient seen for complaints of Non-Healing Wounds. Electronic Signature(s) Signed: 04/29/2023 12:02:13 PM By: Duanne Guess MD FACS Entered By: Duanne Guess on 04/29/2023 12:02:13 -------------------------------------------------------------------------------- Debridement Details Patient Name: Date of Service: [name] [name] 04/29/2023 11:00 A M Medical Record Number: [id number] Patient Account Number: [id number] Date of Birth/Sex: Treating RN: 1951-07-12 (72 y.o. [name] Primary Care Provider: [name] Other Clinician: Referring Provider: Treating Provider/Extender: [name] in Treatment: 63 Debridement Performed for Assessment: Wound #10 Left,Anterior Lower Leg Performed By: Physician Duanne Guess, MD The following information was scribed by: Hunt Oris The information was scribed for: Duanne Guess Debridement Type: Debridement Level of Consciousness (Pre-procedure): Awake and Alert Pre-procedure Verification/Time Out Yes - 11:47 Taken: Start Time: 11:47 Pain Control: Lidocaine 4% T opical Solution Percent of Wound Bed Debrided: 100% T Area Debrided (cm): otal 19.34 Tissue and other material debrided: Non-Viable, Slough, Slough Level: Non-Viable Tissue Debridement Description: Selective/Open Wound Instrument: Curette Bleeding: Minimum Hemostasis Achieved: Pressure Procedural Pain:  0 Post Procedural Pain: 0 Response to Treatment: Procedure was tolerated well Level of Consciousness (Post- Awake and Alert procedure): Post Debridement Measurements of Total Wound Length: (cm) 5.6 Width: (cm) 4.4 Depth: (cm) 0.1 Volume: (cm) 1.935 Character of Wound/Ulcer Post Debridement: Improved [name], [name] ([id number]) [id number] Page 2 of 17 Post Procedure Diagnosis Same as Pre-procedure Electronic Signature(s) Signed: 04/29/2023 12:15:26 PM By: Duanne Guess MD FACS Signed: 04/29/2023 12:22:13 PM By: Hunt Oris Entered By: Hunt Oris on 04/29/2023 11:49:24 -------------------------------------------------------------------------------- Debridement Details Patient Name: Date of Service: [name], [name] 04/29/2023 11:00 A M Medical Record Number: [id number] Patient Account Number: [id number] Date of Birth/Sex: Treating RN: 10-24-1950 (72 y.o. [name] Primary Care Provider: [name] Other Clinician: Referring Provider: Treating Provider/Extender: [name] in Treatment: 63 Debridement Performed for Assessment: Wound #3 Right,Medial Lower Leg Performed By: Physician Duanne Guess, MD The following information was scribed by: Hunt Oris The information was scribed for: Duanne Guess Debridement Type: Debridement Level of Consciousness (Pre-procedure): Awake and Alert Pre-procedure Verification/Time Out Yes - 11:49 Taken: Start Time: 11:49 Pain Control: Lidocaine 4% T opical Solution Percent of Wound Bed Debrided: 25% T Area Debrided (cm): otal 7.8 Tissue and other material debrided: Non-Viable, Slough, Slough Level: Non-Viable Tissue Debridement Description: Selective/Open Wound Instrument: Curette Bleeding: Minimum Hemostasis Achieved: Pressure Procedural Pain: 3 Post Procedural Pain: 3 Response to Treatment: Procedure was tolerated well Level of Consciousness (Post- Awake and  Alert procedure): Post Debridement Measurements of Total Wound Length: (cm) 7.5 Width: (cm) 5.3 Depth: (cm) 0.5 Volume: (cm) 15.61 Character of Wound/Ulcer Post Debridement: Improved Post Procedure Diagnosis Same as Pre-procedure Electronic Signature(s) Signed: 04/29/2023 12:15:26 PM By: Duanne Guess MD FACS Signed: 04/29/2023 12:22:13 PM By: Hunt Oris Entered By: Hunt Oris on 04/29/2023 11:50:57 HPI Details -------------------------------------------------------------------------------- [name] ([id number]) [id number] Page 3 of 17 Patient Name: Date of Service: [name], [name] 04/29/2023 11:00 A M Medical Record Number: [id number] Patient Account Number: [id number] Date of Birth/Sex: Treating RN: [birth year] (72 y.o. M) Primary Care Provider: [name] Other Clinician: Referring Provider: Treating Provider/Extender: Duanne Guess  McGowenQuincy Simmonds in Treatment: 63 History of Present Illness HPI Description: ADMISSION 02/09/2022 This is a 72 year old man who recently moved to [place] from [place]. He has a history of of coronary artery disease and prior left popliteal vein DVT . He is not diabetic and does not smoke. 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 He has had multiple spinal surgeries and has limited mobility. He primarily uses a motorized wheelchair. He has had lymphedema for many years and does have lymphedema pumps although he says he does not use them frequently. He has wounds on his bilateral lower extremities. He was receiving care in [place] for these prior to his move. They were apparently packing his wounds with Betadine soaked gauze. He has worn compression wraps in the past but not recently. He did say that they helped tremendously. In clinic today, his right ABI is noncompressible but has good signals; the left ABI was 1.17. No formal vascular studies have been performed. On his left dorsal  foot, there is a circular lesion that exposes the fat layer. There is fairly heavy slough accumulation within the wound, but no significant odor. On his right medial calf, he has multiple pinholes that have slough buildup in them and probe for about 2 to 4 mm in depth. They are draining clear fluid. On his right lateral midfoot, he has ulceration consistent with venous stasis. It is geographic and has slough accumulation. 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 02/17/2022: All of the wounds are little bit larger today and have more slough accumulation. Today, the medial calf openings are larger and probe to something that feels calcified. There is odor coming from his wounds. His vascular studies are scheduled for August 9 and 10. 02-24-2022 upon evaluation today patient presents for follow-up concerning his ongoing issues here with his lower extremities. With that being said he does have significant problems with what appears to be cellulitis unfortunately the PCR culture that was sent last week got crushed by [employer] in transit. With that being said we are going to reobtain a culture today although I am actually to do it on the right medial leg as this seems to be the most inflamed area today where there is some questionable drainage as well. I am going to remove some of the calcium deposits which are loosening obtain a deeper culture from this area. Fortunately I do not see any evidence of active infection systemically which is good news but locally I think we are having a bigger issue here. He does have his appointment next Thursday with vascular. Patient has also been referred to [facility] for further evaluation and treatment of the calcinosis for possible sulfate injections. 03/04/2022: The culture that was performed last week grew out multiple species including Klebsiella, Morganella, and Pseudomonas  aeruginosa. He had been prescribed Bactrim but this was not adequate to cover all of the species and levofloxacin was recommended. He completed the Bactrim but did not pick up the levofloxacin and begin taking it until yesterday. 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 He had arterial studies performed today. He does have evidence of peripheral arterial disease. Results copied here: +-------+-----------+-----------+------------+------------+ ABI/TBIT oday's ABIT oday's TBIPrevious ABIPrevious TBI +-------+-----------+-----------+------------+------------+ Right  0.75    +-------+-----------+-----------+------------+------------+ Left  [place] 0.57    +-------+-----------+-----------+------------+------------+ Arterial wall calcification precludes accurate ankle pressures and ABIs. Summary: Right: Resting right ankle-brachial index indicates noncompressible right lower extremity arteries. The right toe-brachial index is normal. Left: Resting left ankle-brachial index indicates noncompressible left lower extremity arteries. The left toe-brachial index is abnormal. He continues to have further tissue breakdown at the right medial calf and there is ample slough accumulation along with necrotic subcutaneous tissue. The left dorsal foot wound has built up slough, as well. The right medial foot wound is nearly closed with just a light layer of eschar over the surface. The right dorsal lateral foot wound has also accumulated a fair amount of slough and remains quite tender. 03/10/2022: He has been taking the prescribed levofloxacin and has about 3 days left of this. The erythema and induration on his right leg has improved. He continues to experience further breakdown of the right medial calf wound. There is extensive  slough and debris accumulation there. There is heavy slough on his left dorsal foot wound, but no odor or purulent drainage. The right medial foot wound appears to be closed. The right dorsal lateral foot wound also has a fair amount of slough but it is less tender than at our last visit. He still does not have an appointment with dermatology. 03/22/2022: The right anterior tibial wound has closed. The right lateral foot wound is nearly closed with just a little bit of slough. The left dorsal foot wound is smaller and continues to have some slough buildup but less so than at prior visits. There is good granulation tissue underlying the slough. Unfortunately the right medial calf wound continues to deteriorate. It has a foul odor today and a lot of necrotic tissue, the surrounding skin is also erythematous and moist. 03/30/2022: The right lateral foot wound continues to contract and just has a little bit of slough and eschar present. The left dorsal foot wound is also smaller with slough overlying good granulation tissue. The right medial calf wound actually looks quite a bit better today; there is no odor and there is much less necrotic tissue although some remains present. We have been using topical gentamicin and he has been taking oral Augmentin. 04/07/2022: The patient saw dermatology at [facility] last week. Unfortunately, it appears that they did not read any of the documentation that was provided. 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 Overall, it was a highly unsatisfactory consultation. In the meantime, however, we were able to identify compounding pharmacy who could create a topical sodium thiosulfate ointment. The patient has  that with him today. Both of the right foot wounds are now closed. The left dorsal foot wound has contracted but still has a layer of slough on the surface. The medial calf wounds on the right all look a little bit better. They still have heavy slough along with the chunks of calcium. Everything is stained purple. 04/14/2022: The wound on his dorsal left foot is a little bit smaller again today. There is still slough accumulation on the surface. The medial calf wounds have quite a bit less slough accumulation today. His right leg, however, is warm and erythematous, concerning for cellulitis. [name], [name] ([id number]) [id number] Page 4 of 17 04/14/2022: He completed his course  of Augmentin yesterday. The medial calf wound sites are much cleaner today and shallower. There is less fragmented calcium in the wounds. He has a lot of lymphedema fluid-like drainage from these wounds, but no purulent discharge or malodor. The wound on his dorsal left foot has also contracted and is shallower. There is a layer of slough over healthy granulation tissue. 05/05/2022: The left dorsal foot wound is smaller today with less slough and more granulation tissue. The right medial calf wounds are shallower, but have extensive slough and some fat necrosis. The drainage has diminished considerably, however. 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 05/12/2022: The left dorsal foot wound continues to contract and fill with granulation tissue. There is slough on the wound surface. The right medial calf wounds also continue to fill with healthy tissue, but there is remaining slough and fat necrosis present. He had a significant increase in his drainage this week and it was a bright yellow-green color. He also had a new wound open over his right anterior tibial surface associated with  the spike of cutaneous calcium. The leg is more red, as well, concerning for infection. 05/19/2022: His culture returned positive for Pseudomonas. He is currently taking a course of oral levofloxacin. We have also been using topical gentamicin mixed in with his sodium thiosulfate. All of the wounds look better this week. 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 There is slough and nonviable subcutaneous tissue still present. The new anterior tibial wound just has a light layer of slough. The dorsal foot wound also has some slough present and appears a little dry today. 05/26/2022: The right medial leg wound continues to improve. It is feeling with granulation tissue, but still accumulates a fairly thick layer of slough on the surface. The more recent anterior tibial wound has remained quite small, but also has some slough on the surface. The left dorsal foot wound has contracted somewhat and has perimeter epithelialization. He completed his oral levofloxacin. 06/02/2022: The left dorsal foot wound continues to improve significantly. There is just a little slough on the wound surface. The right medial leg wound had black drainage, but it looks like silver alginate was applied last week and I theorized that silver sulfide was created with a combination of the silver alginate and the sodium thiosulfate. It did, however, have a bit of an odor to it and extensive slough accumulation. The small anterior tibial wound also had a bit of slough and nonviable subcutaneous tissue present. The skin of his leg was rather macerated, as well. 06/09/2022: The left dorsal foot wound is smaller again this week with just a light layer of slough on the surface. The right medial leg wound looks substantially better. The leg and periwound skin are no longer red and macerated. There is good granulation tissue forming with less slough and nonviable tissue. The anterior tibial wound just has a  small amount of slough accumulation. 06/16/2022: The left dorsal foot wound continues to contract. His wrap slipped a little bit, however, and his foot is more swollen. The right medial leg wound continues to improve. The underlying tissue is more vital-appearing and there is less slough. He does have substantial drainage. The small anterior tibial wound has a bit of slough accumulation. 06/23/2022: The left dorsal foot wound is about half the size as it was last week. There is  just some light slough on the surface and some eschar buildup around the edges. The right anterior leg wound is small with a little slough on the surface. Unfortunately, the right medial leg wound deteriorated fairly substantially. It is malodorous with gray/green/black discoloration. 06/30/2022: The left dorsal foot wound continues to contract. It is nearly closed with just a light layer of slough present. The right anterior leg wound is about the same size and has a small amount of slough on the surface. The right medial leg wound looks a lot better than it did last week. The odor has abated and the tissue is less pulpy and necrotic-looking. There is still a fairly thick layer of slough present. 12/13; the left dorsal foot wound looks very healthy. There was no need to change the dressing here and no debridement. He has a small area on the right anterior lower leg apparently had not changed much in size. The area on the medial leg is the most problematic area this is large with some depth and a completely nonviable surface today. 07/14/2022: The left dorsal foot wound is smaller today with just a little slough and eschar present. The right anterior lower leg wound is about the same without any significant change. The medial leg wound has a black and green surface and is malodorous. No purulent drainage and the periwound skin is intact. Edema control is suboptimal. 07/21/2022: The dorsal foot wound continues to contract and is  nearly closed with just a little bit of slough and eschar on the surface. The right anterior lower leg wound is shallower today but otherwise the dimensions are unchanged. The medial leg wound looks significantly better although it still has a nonviable surface; the odor and black/green surface has resolved. His culture came back with multiple species sensitive to Augmentin, which was prescribed and a very low level of Pseudomonas, which should be handled by the topical gentamicin we have been using. 07/28/2022: The dorsal foot wound is down to just a very tiny opening with a little eschar on the surface. The right anterior tibial wound is about the same with some slough buildup. The right medial leg wound looks quite a bit better this week. There is thick rubbery slough on the surface, but the odor and drainage has improved significantly. 08/04/2022: The dorsal foot wound is almost healed, but the periwound skin has broken down. This appears to be secondary to moisture and adhesive from the absorbent pad. The right anterior tibial wound is shallower. The right medial leg wound continues to improve. It is smaller, still with rubbery slough on the surface. No malodor or purulent drainage. 08/11/2022: The dorsal foot wound is about the same size. It is a little bit dry with some slough accumulation. The right anterior tibial wound is also unchanged. The right medial leg wound is filling in further with good granulation tissue. Still with some slough buildup on the surface. 08/18/2022: His left leg is bright red, hot, and swollen with cellulitis. The dorsal foot wound actually looks quite good and is superficial with just a little bit of slough accumulation. The right anterior tibial wound is unchanged. The right medial leg wound has filled in with good granulation tissue but has copious amounts of drainage. 08/25/2022: His cellulitis has responded nicely to the oral antibiotics. His dorsal foot wound is  smaller but has a fair amount of slough buildup. The right anterior tibial wound remains stable and unchanged. The right medial leg wound has more granulation tissue under  a layer of slough, but continues to have copious drainage. The drainage is actually causing skin irritation and threatens to result in breakdown. 09/01/2022: He continues to have profuse drainage from his wounds which is resulting in tissue maceration on both the dorsum of his left foot and the periwound distal to his medial leg wound. The dorsal foot wound is nearly healed, despite this. The medial right leg wound is filling in with good granulation tissue. We are working on getting home health assistance to change his dressings more frequently to try and control the drainage better. 09/08/2022: We have had success in controlling his drainage. The tissue maceration has improved significantly and his swelling has come down quite markedly. The medial right leg wound is much more superficial and has substantially less nonviable tissue present. The anterior tibial wound is a little bit larger, however with some nonviable tissue present. 09/15/2022: The dorsal foot wound is nearly closed. The anterior tibial wound measures slightly larger today. The medial right leg wound continues to improve quite dramatically with less nonviable tissue and more robust-looking granulation. Edema control is significantly better. 09/22/2022: The left dorsal foot wound remains open, albeit quite tiny. The anterior tibial wound is a little bit larger again today. The medial right leg wound continues to improve with an increase in robust and healthy granulation tissue and less accumulation of nonviable tissue. 09/29/2022: His home health providers wrapped his left leg extremely tightly and he ended up having to cut the wrap off of his foot. This resulted in his foot being very swollen and that the dorsal foot wound is a little bit larger today. The anterior tibial  wound is about the same size with a little slough on the surface. The medial right leg wound continues to fill in. There are very few areas with any significant depth. There is still slough accumulation. [name], [name] ([id number]) [id number] Page 5 of 17 3/13; Patient has a left dorsal foot wound along with a right anterior tibial wound and right medial wound. We are using silver alginate to the left dorsal foot wound, Santyl and sodium thiosulfate compound ointment to the right lower extremity wounds under 4 layer compression. Patient has no issues or complaints today. 10/13/2022: The left dorsal foot wound is a little bit bigger today, but his foot is also a little bit more swollen. The right medial leg wound has filled in more and has substantially less depth. There is still some nonviable tissue present. The small anterior lower leg wound looks about the same. 10/20/2022: The left dorsal foot wound is epithelializing. There does not appear to be much open at this site. There is some eschar around the edges. The right medial leg wound continues to fill in with granulation tissue. The small anterior lower leg wound is a little bit bigger and there is a chunk of calcium protruding. He continues to have fairly copious drainage from the right leg. 10/27/2022: His home health agency failed to show up at all this past week and as a result, the excessive drainage from his right leg wound has resulted in tissue breakdown. His leg is red and irritated. The wounds themselves look about the same. The wound on his left dorsal foot is nearly closed underneath some eschar. 11/03/2022: We continue to have difficulty with his home health agency. He continues to have significant drainage from his right leg wound which has resulted in even further tissue breakdown. He also was not taking his Lasix as prescribed and I  think much of the drainage has been his lymphedema fluid choosing the path of  least resistance. The wound on his left dorsal foot is closed, but in the process of cleaning the site, dry skin was pulled off and he has a new small superficial wound just adjacent to where the dorsal foot wound was. 11/12/2022: His left dorsal foot is completely healed. The right medial leg continues to deteriorate. He is not taking his Lasix as prescribed, nor is he using his lymphedema pumps at all. As a result, he has massive amounts of drainage saturating his dressings and causing further tissue breakdown on his medial leg. Today, his dressings had a foul odor, as well. 11/19/2022: His right medial leg looks even worse today. There is more erythema and moisture related tissue breakdown. The 2 anterior wounds look a little bit better. The culture that I took last week did produce Pseudomonas and he is currently taking levofloxacin for this. 11/26/2022: There has been significant improvement to his right medial leg. It is far less raw and many of the superficial open areas have epithelialized. He continues to cut the foot portion of his wrap and as result his foot is bulbous and swollen compared to the rest of his leg, where edema control is good. The smaller of the 2 anterior tibial wound is closed, while the larger is contracting somewhat with a bit of slough at the base. 12/03/2022: His leg continues to demonstrate improvement. There has been epithelialization of much of the denuded area. He has a new small anterior tibial wound that has opened around a nidus of calcium. Edema control is improved. 12/10/2022: Continued improvement in the periwound skin. The anterior tibial wounds are about the same, but the medial leg wound has improved further. There is less nonviable tissue present. 12/17/2022: Between his visit on Tuesday with the nurse and today, he has had significantly more drainage which has resulted in some periwound tissue maceration. I suspect this correlates somewhat with his diuretic use,  as he says he takes his diuretic religiously on Saturday, Sunday, and Monday and then does not take it after Tuesday due to being out and about and not being close to easy restroom access. I suspect the excess fluid is just simply draining out of his leg. 12/21/2022: This was supposed to be a nurse visit, but his leg has broken down so significantly, that we have converted to a wound care visit. He did take his diuretics over the weekend, but despite this, he has had copious drainage and all of his tissues have broken down substantially from the right medial leg ulcer all the way down to his ankle and even wrapping posterior to this. There is blue-green staining on his dressing, suggesting ongoing presence of Pseudomonas aeruginosa. 12/24/2022: The wound already looks better than it did 2 days ago. He has been taking ciprofloxacin and the Urgo clean dressing we applied did a much better job of wicking the drainage away from his leg. There is slough on the entire surface of the medial leg wound, but the erythema and induration has improved in the past couple of days. 12/31/2022: Continued improvement of the wound. He says that the pain has basically abated since being on ciprofloxacin. Still with fairly heavy drainage and slough accumulation. 01/07/2023: Overall continued improvement. Still with heavy slough buildup, but less drainage. 01/14/2023: There is a little bit of improvement. The skin is still looking a bit excoriated. He has a few more days left of ciprofloxacin.  01/21/2023: There has been significant improvement. The periwound skin is in much better condition. The ulcers are shallower and actually have some good granulation tissue present under some slough. 01/28/2023: The wound continues to improve. All of the open areas measured smaller today. There is slough accumulation on the surfaces. His drainage has decreased markedly. 02/04/2023: Continued improvement in the wound. Drainage remains  significantly diminished. Slough accumulation is present but to a lesser extent. Periwound skin continues to improve. Edema control is good. 02/18/2023: The heavy drainage persists, although it is was not as significant today as it was at his nurse visit on Tuesday. The drainage is yellow and does not have the blue-green discoloration suggestive of Pseudomonas. The actual wounds on his leg are getting smaller, but the periwound skin is quite excoriated. 02/25/2023: His periwound skin is quite excoriated and he has a lot of clear yellow drainage. The main wounds are filling in further and epithelializing. 03/04/2023: The excoriation of the periwound skin has improved. Most of that area has dried up. The main wounds have less slough in them today. 03/11/2023: The main wounds have accumulated a little bit of slough. The periwound excoriation continues to improve. Unfortunately, he suffered a fall over the previous weekend and has abrasions on his knees bilaterally and on his right wrist and upper arm. In addition, the skin on his left lower leg apparently blistered and opened, leaving several superficial wounds on this extremity. He has been approved for the [facility] and Press wound VAC and has the equipment with him today. 03/18/2023: He has extensive excoriation to the periwound skin on the right medial lower leg. The actual wounds themselves are smaller with slough accumulation. The top of his left foot has begun weeping again. Using the [facility] and Press wound VAC, he says that he through 4 canisters. He seems to be very fluid overloaded, without any significant dyspnea. 03/25/2023: The wound to his left anterior tibial surface has healed. The left dorsal foot wound has gotten smaller and has a small area of hypertrophic granulation tissue present. The periwound skin on the right medial lower leg looks slightly better, but still appears excoriated. The actual wounds continue to contract but are still accumulating  quite a bit of slough. The tiny right anterior leg wounds are contracting. 04/01/2023: The left dorsal foot wound got a little bit bigger this week. There is slough accumulation. The periwound skin on the right medial lower leg continues to improve. The main medial right lower leg wounds have a dark gray discoloration and there is a strong odor coming from them. The back of his right lower leg just above the ankle also has a little bit of new breakdown where he is draining edema fluid. The small anterior leg wounds are stable. [name], [name] ([id number]) [id number] Page 6 of 17 04/08/2023: Since starting antibiotics last week, there has been tremendous improvement. The left dorsal foot wound is nearly closed with just a little bit of eschar on the surface. The right medial lower leg is less excoriated and red and he has had substantially less drainage. The amount of slough on the open areas of his wounds has decreased markedly and there is no longer any odor. The back of his right lower leg that was newly open last week has dried up, but is not completely closed yet. 04/15/2023: The improvement continues with significantly less drainage, resulting in less excoriation and irritation. The back of his right lower leg is closed, as is the left dorsal  foot wound. The open areas on the right medial leg are smaller. 04/22/2023: His drainage has diminished significantly. The tissue maceration and excoriation surrounding the right medial leg wound has also improved substantially. There is slough on all of the open wound surfaces. He does have a bit of an odor coming from his wound today, but there is no purulent drainage nor any significant discoloration of the drainage in his VAC. 04/29/2023: He has a new wound on his left lower anterior tibial surface. This was a blister that came up and then ruptured. There is thin slough on the wound surface but no concern for infection. He has had  significantly diminished drainage from his right medial leg ulcers. He was previously going through 4 canisters a day secondary to drainage and this week, he has used only 1 for the entire week. The open areas on his right medial leg are smaller and fewer in number. There is slough on all of the surfaces. No malodor appreciated. Electronic Signature(s) Signed: 04/29/2023 12:04:07 PM By: Duanne Guess MD FACS Entered By: Duanne Guess on 04/29/2023 12:04:07 -------------------------------------------------------------------------------- Physical Exam Details Patient Name: Date of Service: [name], [name] [name] 04/29/2023 11:00 A M Medical Record Number: [id number] Patient Account Number: [id number] Date of Birth/Sex: Treating RN: 12/25/1950 (72 y.o. M) Primary Care Provider: [name] Other Clinician: Referring Provider: Treating Provider/Extender: [name] in Treatment: 61 Constitutional Slightly hypertensive. . . . no acute distress. Respiratory Normal work of breathing on room air. Notes 04/29/2023: He has a new wound on his left lower anterior tibial surface. This was a blister that came up and then ruptured. There is thin slough on the wound surface but no concern for infection. He has had significantly diminished drainage from his right medial leg ulcers. The open areas on his right medial leg are smaller and fewer in number. There is slough on all of the surfaces. No malodor appreciated. Electronic Signature(s) Signed: 04/29/2023 12:07:48 PM By: Duanne Guess MD FACS Entered By: Duanne Guess on 04/29/2023 12:07:48 -------------------------------------------------------------------------------- Physician Orders Details Patient Name: Date of Service: [name] [name] 04/29/2023 11:00 A M Medical Record Number: [id number] Patient Account Number: [id number] Date of Birth/Sex: Treating RN: November 12, 1950 (72 y.o. 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 - Dr. [name] RM 1 05/06/2023 @ 1100 Return A Nurse Visit: - Tuesday 10/08 @ 0315 pm Anesthetic (In clinic) Topical Lidocaine 4% applied to wound bed Bathing/ Shower/ Hygiene May shower with protection but do not get wound dressing(s) wet. Protect dressing(s) with water repellant cover (for example, large plastic bag) or a cast cover and may then take shower. Negative Presssure Wound Therapy Wound Vac to wound continuously at 158mm/hg pressure Black Foam - peel and place Edema Control - Lymphedema / SCD / Other Lymphedema  Pumps. Use Lymphedema pumps on leg(s) 2-3 times a day for 45-60 minutes. If wearing any wraps or hose, do not remove them. Continue exercising as instructed. Elevate legs to the level of the heart or above for 30 minutes daily and/or when sitting for 3-4 times a day throughout the day. - throughout the day Avoid standing for long periods of time. Exercise regularly Wound Treatment Wound #10 - Lower Leg Wound Laterality: Left, Anterior Peri-Wound Care:  Sween Lotion (Moisturizing lotion) 1 x Per Week/30 Days Discharge Instructions: Apply moisturizing lotion as directed Prim Dressing: Maxorb Extra Ag+ Alginate Dressing, 2x2 (in/in) 1 x Per Week/30 Days ary Discharge Instructions: Apply to wound bed as instructed Secondary Dressing: ABD Pad, 8x10 1 x Per Week/30 Days Discharge Instructions: Apply over primary dressing as directed. Compression Wrap: ThreePress (3 layer compression wrap) 1 x Per Week/30 Days Discharge Instructions: Apply three layer compression as directed. Wound #3 - Lower Leg Wound Laterality: Right, Medial Peri-Wound Care: Skin Prep 1 x Per Week/30 Days Discharge Instructions: cavelon to macerated wound margins Peri-Wound Care: Sween Lotion (Moisturizing lotion) 1 x Per Week/30 Days Discharge Instructions: Apply moisturizing lotion as directed Topical: Gentamicin 1 x Per Week/30 Days Discharge Instructions: to open areas Topical: Mupirocin Ointment 1 x Per Week/30 Days Discharge Instructions: Apply Mupirocin (Bactroban) as instructed Prim Dressing: Maxorb Extra Ag+ Alginate Dressing, 4x4.75 (in/in) 1 x Per Week/30 Days ary Discharge Instructions: Apply to excoriated weeping areas Secondary Dressing: Zetuvit Plus 4x8 in 1 x Per Week/30 Days Discharge Instructions: Apply over primary dressing as directed. Compression Wrap: ThreePress (3 layer compression wrap) 1 x Per Week/30 Days Discharge Instructions: Apply three layer compression as directed. Wound #6 - Lower Leg Wound Laterality: Right, Anterior Peri-Wound Care: Sween Lotion (Moisturizing lotion) 1 x Per Week/30 Days Discharge Instructions: Apply moisturizing lotion as directed Prim Dressing: Maxorb Extra Ag+ Alginate Dressing, 2x2 (in/in) 1 x Per Week/30 Days ary Discharge Instructions: Apply to wound bed as instructed [name], [name] ([id number]) [id number] Page 8 of 17 Secondary Dressing: ABD Pad, 8x10 1 x Per Week/30 Days Discharge  Instructions: Apply over primary dressing as directed. Compression Wrap: ThreePress (3 layer compression wrap) 1 x Per Week/30 Days Discharge Instructions: Apply three layer compression as directed. Patient Medications llergies: No Known Allergies A Notifications Medication Indication Start End 04/29/2023 levofloxacin DOSE oral 750 mg tablet - 1 tab PO daily Electronic Signature(s) Signed: 04/29/2023 12:15:26 PM By: Duanne Guess MD FACS Previous Signature: 04/29/2023 12:09:11 PM Version By: Duanne Guess MD FACS Entered By: Duanne Guess on 04/29/2023 12:09:36 -------------------------------------------------------------------------------- Problem List Details Patient Name: Date of Service: [name] [name] 04/29/2023 11:00 A M Medical Record Number: [id number] Patient Account Number: [id number] Date of Birth/Sex: Treating RN: Apr 02, 1951 (72 y.o. 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 02/09/2022 No Yes I89.0 Lymphedema, not elsewhere classified 02/09/2022 No Yes I87.2 Venous insufficiency (chronic) (peripheral) 02/09/2022 No Yes Z86.718 Personal history of other venous thrombosis and embolism 02/09/2022 No Yes I10 Essential (primary) hypertension 02/09/2022 No Yes I25.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 angina pectoris 02/09/2022 No Yes JORDIN, VICENCIO ([id number]) [id number] Page 9 of 17 Inactive  Problems ICD-10 Code Description Active Date Inactive Date L97.512 Non-pressure  chronic ulcer of other part of right foot with fat layer exposed 02/17/2022 02/17/2022 Resolved Problems ICD-10 Code Description Active Date Resolved Date L97.821 Non-pressure chronic ulcer of other part of left lower leg limited to breakdown of skin 03/11/2023 03/11/2023 L98.492 Non-pressure chronic ulcer of skin of other sites with fat layer exposed 03/11/2023 03/11/2023 L97.521 Non-pressure chronic ulcer of other part of left foot limited to breakdown of skin 03/18/2023 02/09/2022 Electronic Signature(s) Signed: 04/29/2023 12:01:54 PM By: Duanne Guess MD FACS Previous Signature: 04/29/2023 11:11:18 AM Version By: Duanne Guess MD FACS Entered By: Duanne Guess on 04/29/2023 12:01:54 -------------------------------------------------------------------------------- Progress Note Details Patient Name: Date of Service: [name] [name] 04/29/2023 11:00 A M Medical Record Number: [id number] Patient Account Number: [id number] Date of Birth/Sex: Treating RN: 1951-01-20 (72 y.o. M) Primary Care Provider: [name] Other Clinician: Referring Provider: Treating Provider/Extender: [name] in Treatment: 57 Subjective Chief Complaint Information obtained from Patient Patient seen for complaints of Non-Healing Wounds. History of Present Illness (HPI) ADMISSION 02/09/2022 This is a 72 year old man who recently moved to [place] from [place]. He has a history of of coronary artery disease and prior left popliteal vein DVT . He is not diabetic and does not smoke. 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 He has had multiple spinal surgeries and has limited mobility. He primarily uses a motorized wheelchair. He has had lymphedema for many years and does have lymphedema pumps although he says he does not use them frequently. He has wounds on his bilateral lower extremities. He was receiving care in [place] for these prior to his move. They were  apparently packing his wounds with Betadine soaked gauze. He has worn compression wraps in the past but not recently. He did say that they helped tremendously. In clinic today, his right ABI is noncompressible but has good signals; the left ABI was 1.17. No formal vascular studies have been performed. On his left dorsal foot, there is a circular lesion that exposes the fat layer. There is fairly heavy slough accumulation within the wound, but no significant odor. On his right medial calf, he has multiple pinholes that have slough buildup in them and probe for about 2 to 4 mm in depth. They are draining clear fluid. On his right lateral midfoot, he has ulceration consistent with venous stasis. It is geographic and has slough accumulation. 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 02/17/2022: All of the wounds are little bit larger today and have more slough accumulation. Today, the medial calf openings are larger and probe to something that feels calcified. There is odor coming from his wounds. His vascular studies are scheduled for August 9 and 10. 02-24-2022 upon evaluation today patient presents for follow-up concerning his ongoing issues here with his lower extremities. With that being said he does have significant problems with what appears to be cellulitis unfortunately the PCR culture that was sent last week got crushed by [employer] in transit. With that being said we are going to reobtain a culture today although I am actually to do it on the right medial leg as this seems to be the most inflamed area today where there is some questionable drainage as well. I am going to remove some of the calcium deposits which are loosening obtain a deeper culture from this area. Fortunately I do not  see any evidence of active infection systemically which is good news but locally I think we are having a bigger issue here. He does  have his appointment next Thursday with vascular. Patient has also been referred to [facility] for further evaluation and treatment of the calcinosis for possible sulfate injections. [name], [name] ([id number]) [id number] Page 10 of 17 03/04/2022: The culture that was performed last week grew out multiple species including Klebsiella, Morganella, and Pseudomonas aeruginosa. He had been prescribed Bactrim but this was not adequate to cover all of the species and levofloxacin was recommended. He completed the Bactrim but did not pick up the levofloxacin and begin taking it until yesterday. 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 He had arterial studies performed today. He does have evidence of peripheral arterial disease. Results copied here: +-------+-----------+-----------+------------+------------+ ABI/TBIT oday's ABIT oday's TBIPrevious ABIPrevious TBI +-------+-----------+-----------+------------+------------+ Right [place] 0.75    +-------+-----------+-----------+------------+------------+ Left [place] 0.57    +-------+-----------+-----------+------------+------------+ Arterial wall calcification precludes accurate ankle pressures and ABIs. Summary: Right: Resting right ankle-brachial index indicates noncompressible right lower extremity arteries. The right toe-brachial index is normal. Left: Resting left ankle-brachial index indicates noncompressible left lower extremity arteries. The left toe-brachial index is abnormal. He continues to have further tissue breakdown at the right medial calf and there is ample slough accumulation along with necrotic subcutaneous tissue. The left dorsal foot wound has built up slough, as well. The right medial foot wound is nearly closed with just a light  layer of eschar over the surface. The right dorsal lateral foot wound has also accumulated a fair amount of slough and remains quite tender. 03/10/2022: He has been taking the prescribed levofloxacin and has about 3 days left of this. The erythema and induration on his right leg has improved. He continues to experience further breakdown of the right medial calf wound. There is extensive slough and debris accumulation there. There is heavy slough on his left dorsal foot wound, but no odor or purulent drainage. The right medial foot wound appears to be closed. The right dorsal lateral foot wound also has a fair amount of slough but it is less tender than at our last visit. He still does not have an appointment with dermatology. 03/22/2022: The right anterior tibial wound has closed. The right lateral foot wound is nearly closed with just a little bit of slough. The left dorsal foot wound is smaller and continues to have some slough buildup but less so than at prior visits. There is good granulation tissue underlying the slough. Unfortunately the right medial calf wound continues to deteriorate. It has a foul odor today and a lot of necrotic tissue, the surrounding skin is also erythematous and moist. 03/30/2022: The right lateral foot wound continues to contract and just has a little bit of slough and eschar present. The left dorsal foot wound is also smaller with slough overlying good granulation tissue. The right medial calf wound actually looks quite a bit better today; there is no odor and there is much less necrotic tissue although some remains present. We have been using topical gentamicin and he has been taking oral Augmentin. 04/07/2022: The patient saw dermatology at [facility] last week. Unfortunately, it appears that they did not read any of the documentation that was provided. 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 Overall, it was a highly unsatisfactory consultation. In the meantime, however, we were able to identify compounding pharmacy who could create a topical sodium thiosulfate ointment. The patient has that with him today. Both of the right foot wounds are now closed. The left dorsal foot wound has contracted but still has a layer of slough on the surface. The medial calf wounds on the right all look a little bit better. They still have heavy slough along with the chunks of calcium. Everything is stained purple. 04/14/2022: The wound on his dorsal left foot is a little bit smaller again today. There is still slough accumulation on the surface. The medial calf wounds have quite a bit less slough accumulation today. His right leg, however, is warm and erythematous, concerning for cellulitis. 04/14/2022: He completed his course of Augmentin yesterday. The medial calf wound sites are much cleaner today and shallower. There is less fragmented calcium in the wounds. He has a lot of lymphedema fluid-like drainage from these wounds, but no purulent discharge or malodor. The wound on his dorsal left foot has also contracted and is shallower. There is a layer of slough over healthy granulation tissue. 05/05/2022: The left dorsal foot wound is smaller today with less slough and more granulation tissue. The right medial calf wounds are shallower, but have extensive slough and some fat necrosis. The drainage has diminished considerably, however. 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 05/12/2022: The left dorsal foot wound continues to contract and fill with granulation tissue. There is slough on the wound  surface. The right medial calf wounds also continue to fill with healthy tissue, but there is remaining slough and fat necrosis present. He had a significant increase in his drainage this week and it was a bright yellow-green color. He also had a new wound open over his right anterior tibial surface associated with the spike of cutaneous calcium. The leg is more red, as well, concerning for infection. 05/19/2022: His culture returned positive for Pseudomonas. He is currently taking a course of oral levofloxacin. We have also been using topical gentamicin mixed in with his sodium thiosulfate. All of the wounds look better this week. 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 There is slough and nonviable subcutaneous tissue still present. The new anterior tibial wound just has a light layer of slough. The dorsal foot wound also has some slough present and appears a little dry today. 05/26/2022: The right medial leg wound continues to improve. It is feeling with granulation tissue, but still accumulates a fairly thick layer of slough on the surface. The more recent anterior tibial wound has remained quite small, but also has some slough on the surface. The left dorsal foot wound has contracted somewhat and has perimeter epithelialization. He completed his oral levofloxacin. 06/02/2022: The left dorsal foot wound continues to improve significantly. There is just a little slough on the wound surface. The right medial leg wound had black drainage, but it looks like silver alginate was applied last week and I theorized that silver sulfide was created with a combination of the silver alginate and the sodium thiosulfate. It did, however, have a bit of an odor  to it and extensive slough accumulation. The small anterior tibial wound also had a bit of slough and nonviable subcutaneous tissue present. The skin of his leg was rather macerated, as well. 06/09/2022: The left dorsal  foot wound is smaller again this week with just a light layer of slough on the surface. The right medial leg wound looks substantially better. The leg and periwound skin are no longer red and macerated. There is good granulation tissue forming with less slough and nonviable tissue. The anterior tibial wound just has a small amount of slough accumulation. 06/16/2022: The left dorsal foot wound continues to contract. His wrap slipped a little bit, however, and his foot is more swollen. The right medial leg wound continues to improve. The underlying tissue is more vital-appearing and there is less slough. He does have substantial drainage. The small anterior tibial wound has a bit of slough accumulation. 06/23/2022: The left dorsal foot wound is about half the size as it was last week. There is just some light slough on the surface and some eschar buildup around [place], [name] ([id number]) [id number] Page 11 of 17 the edges. The right anterior leg wound is small with a little slough on the surface. Unfortunately, the right medial leg wound deteriorated fairly substantially. It is malodorous with gray/green/black discoloration. 06/30/2022: The left dorsal foot wound continues to contract. It is nearly closed with just a light layer of slough present. The right anterior leg wound is about the same size and has a small amount of slough on the surface. The right medial leg wound looks a lot better than it did last week. The odor has abated and the tissue is less pulpy and necrotic-looking. There is still a fairly thick layer of slough present. 12/13; the left dorsal foot wound looks very healthy. There was no need to change the dressing here and no debridement. He has a small area on the right anterior lower leg apparently had not changed much in size. The area on the medial leg is the most problematic area this is large with some depth and a completely nonviable surface  today. 07/14/2022: The left dorsal foot wound is smaller today with just a little slough and eschar present. The right anterior lower leg wound is about the same without any significant change. The medial leg wound has a black and green surface and is malodorous. No purulent drainage and the periwound skin is intact. Edema control is suboptimal. 07/21/2022: The dorsal foot wound continues to contract and is nearly closed with just a little bit of slough and eschar on the surface. The right anterior lower leg wound is shallower today but otherwise the dimensions are unchanged. The medial leg wound looks significantly better although it still has a nonviable surface; the odor and black/green surface has resolved. His culture came back with multiple species sensitive to Augmentin, which was prescribed and a very low level of Pseudomonas, which should be handled by the topical gentamicin we have been using. 07/28/2022: The dorsal foot wound is down to just a very tiny opening with a little eschar on the surface. The right anterior tibial wound is about the same with some slough buildup. The right medial leg wound looks quite a bit better this week. There is thick rubbery slough on the surface, but the odor and drainage has improved significantly. 08/04/2022: The dorsal foot wound is almost healed, but the periwound skin has broken down. This appears to be secondary to moisture and adhesive from the absorbent  pad. The right anterior tibial wound is shallower. The right medial leg wound continues to improve. It is smaller, still with rubbery slough on the surface. No malodor or purulent drainage. 08/11/2022: The dorsal foot wound is about the same size. It is a little bit dry with some slough accumulation. The right anterior tibial wound is also unchanged. The right medial leg wound is filling in further with good granulation tissue. Still with some slough buildup on the surface. 08/18/2022: His left leg is  bright red, hot, and swollen with cellulitis. The dorsal foot wound actually looks quite good and is superficial with just a little bit of slough accumulation. The right anterior tibial wound is unchanged. The right medial leg wound has filled in with good granulation tissue but has copious amounts of drainage. 08/25/2022: His cellulitis has responded nicely to the oral antibiotics. His dorsal foot wound is smaller but has a fair amount of slough buildup. The right anterior tibial wound remains stable and unchanged. The right medial leg wound has more granulation tissue under a layer of slough, but continues to have copious drainage. The drainage is actually causing skin irritation and threatens to result in breakdown. 09/01/2022: He continues to have profuse drainage from his wounds which is resulting in tissue maceration on both the dorsum of his left foot and the periwound distal to his medial leg wound. The dorsal foot wound is nearly healed, despite this. The medial right leg wound is filling in with good granulation tissue. We are working on getting home health assistance to change his dressings more frequently to try and control the drainage better. 09/08/2022: We have had success in controlling his drainage. The tissue maceration has improved significantly and his swelling has come down quite markedly. The medial right leg wound is much more superficial and has substantially less nonviable tissue present. The anterior tibial wound is a little bit larger, however with some nonviable tissue present. 09/15/2022: The dorsal foot wound is nearly closed. The anterior tibial wound measures slightly larger today. The medial right leg wound continues to improve quite dramatically with less nonviable tissue and more robust-looking granulation. Edema control is significantly better. 09/22/2022: The left dorsal foot wound remains open, albeit quite tiny. The anterior tibial wound is a little bit larger again  today. The medial right leg wound continues to improve with an increase in robust and healthy granulation tissue and less accumulation of nonviable tissue. 09/29/2022: His home health providers wrapped his left leg extremely tightly and he ended up having to cut the wrap off of his foot. This resulted in his foot being very swollen and that the dorsal foot wound is a little bit larger today. The anterior tibial wound is about the same size with a little slough on the surface. The medial right leg wound continues to fill in. There are very few areas with any significant depth. There is still slough accumulation. 3/13; Patient has a left dorsal foot wound along with a right anterior tibial wound and right medial wound. We are using silver alginate to the left dorsal foot wound, Santyl and sodium thiosulfate compound ointment to the right lower extremity wounds under 4 layer compression. Patient has no issues or complaints today. 10/13/2022: The left dorsal foot wound is a little bit bigger today, but his foot is also a little bit more swollen. The right medial leg wound has filled in more and has substantially less depth. There is still some nonviable tissue present. The small anterior lower  leg wound looks about the same. 10/20/2022: The left dorsal foot wound is epithelializing. There does not appear to be much open at this site. There is some eschar around the edges. The right medial leg wound continues to fill in with granulation tissue. The small anterior lower leg wound is a little bit bigger and there is a chunk of calcium protruding. He continues to have fairly copious drainage from the right leg. 10/27/2022: His home health agency failed to show up at all this past week and as a result, the excessive drainage from his right leg wound has resulted in tissue breakdown. His leg is red and irritated. The wounds themselves look about the same. The wound on his left dorsal foot is nearly closed underneath  some eschar. 11/03/2022: We continue to have difficulty with his home health agency. He continues to have significant drainage from his right leg wound which has resulted in even further tissue breakdown. He also was not taking his Lasix as prescribed and I think much of the drainage has been his lymphedema fluid choosing the path of least resistance. The wound on his left dorsal foot is closed, but in the process of cleaning the site, dry skin was pulled off and he has a new small superficial wound just adjacent to where the dorsal foot wound was. 11/12/2022: His left dorsal foot is completely healed. The right medial leg continues to deteriorate. He is not taking his Lasix as prescribed, nor is he using his lymphedema pumps at all. As a result, he has massive amounts of drainage saturating his dressings and causing further tissue breakdown on his medial leg. Today, his dressings had a foul odor, as well. 11/19/2022: His right medial leg looks even worse today. There is more erythema and moisture related tissue breakdown. The 2 anterior wounds look a little bit better. The culture that I took last week did produce Pseudomonas and he is currently taking levofloxacin for this. 11/26/2022: There has been significant improvement to his right medial leg. It is far less raw and many of the superficial open areas have epithelialized. He continues to cut the foot portion of his wrap and as result his foot is bulbous and swollen compared to the rest of his leg, where edema control is good. The smaller of the 2 anterior tibial wound is closed, while the larger is contracting somewhat with a bit of slough at the base. 12/03/2022: His leg continues to demonstrate improvement. There has been epithelialization of much of the denuded area. He has a new small anterior tibial wound that has opened around a nidus of calcium. Edema control is improved. 12/10/2022: Continued improvement in the periwound skin. The anterior  tibial wounds are about the same, but the medial leg wound has improved further. There RITVIK, MCZEAL ([id number]) [id number] Page 12 of 17 is less nonviable tissue present. 12/17/2022: Between his visit on Tuesday with the nurse and today, he has had significantly more drainage which has resulted in some periwound tissue maceration. I suspect this correlates somewhat with his diuretic use, as he says he takes his diuretic religiously on Saturday, Sunday, and Monday and then does not take it after Tuesday due to being out and about and not being close to easy restroom access. I suspect the excess fluid is just simply draining out of his leg. 12/21/2022: This was supposed to be a nurse visit, but his leg has broken down so significantly, that we have converted to a wound care visit. He did  take his diuretics over the weekend, but despite this, he has had copious drainage and all of his tissues have broken down substantially from the right medial leg ulcer all the way down to his ankle and even wrapping posterior to this. There is blue-green staining on his dressing, suggesting ongoing presence of Pseudomonas aeruginosa. 12/24/2022: The wound already looks better than it did 2 days ago. He has been taking ciprofloxacin and the Urgo clean dressing we applied did a much better job of wicking the drainage away from his leg. There is slough on the entire surface of the medial leg wound, but the erythema and induration has improved in the past couple of days. 12/31/2022: Continued improvement of the wound. He says that the pain has basically abated since being on ciprofloxacin. Still with fairly heavy drainage and slough accumulation. 01/07/2023: Overall continued improvement. Still with heavy slough buildup, but less drainage. 01/14/2023: There is a little bit of improvement. The skin is still looking a bit excoriated. He has a few more days left of ciprofloxacin. 01/21/2023: There has  been significant improvement. The periwound skin is in much better condition. The ulcers are shallower and actually have some good granulation tissue present under some slough. 01/28/2023: The wound continues to improve. All of the open areas measured smaller today. There is slough accumulation on the surfaces. His drainage has decreased markedly. 02/04/2023: Continued improvement in the wound. Drainage remains significantly diminished. Slough accumulation is present but to a lesser extent. Periwound skin continues to improve. Edema control is good. 02/18/2023: The heavy drainage persists, although it is was not as significant today as it was at his nurse visit on Tuesday. The drainage is yellow and does not have the blue-green discoloration suggestive of Pseudomonas. The actual wounds on his leg are getting smaller, but the periwound skin is quite excoriated. 02/25/2023: His periwound skin is quite excoriated and he has a lot of clear yellow drainage. The main wounds are filling in further and epithelializing. 03/04/2023: The excoriation of the periwound skin has improved. Most of that area has dried up. The main wounds have less slough in them today. 03/11/2023: The main wounds have accumulated a little bit of slough. The periwound excoriation continues to improve. Unfortunately, he suffered a fall over the previous weekend and has abrasions on his knees bilaterally and on his right wrist and upper arm. In addition, the skin on his left lower leg apparently blistered and opened, leaving several superficial wounds on this extremity. He has been approved for the [facility] wound VAC and has the equipment with him today. 03/18/2023: He has extensive excoriation to the periwound skin on the right medial lower leg. The actual wounds themselves are smaller with slough accumulation. The top of his left foot has begun weeping again. Using the [facility] wound VAC, he says that he through 4 canisters. He seems  to be very fluid overloaded, without any significant dyspnea. 03/25/2023: The wound to his left anterior tibial surface has healed. The left dorsal foot wound has gotten smaller and has a small area of hypertrophic granulation tissue present. The periwound skin on the right medial lower leg looks slightly better, but still appears excoriated. The actual wounds continue to contract but are still accumulating quite a bit of slough. The tiny right anterior leg wounds are contracting. 04/01/2023: The left dorsal foot wound got a little bit bigger this week. There is slough accumulation. The periwound skin on the right medial lower leg continues  to improve. The main medial right lower leg wounds have a dark gray discoloration and there is a strong odor coming from them. The back of his right lower leg just above the ankle also has a little bit of new breakdown where he is draining edema fluid. The small anterior leg wounds are stable. 04/08/2023: Since starting antibiotics last week, there has been tremendous improvement. The left dorsal foot wound is nearly closed with just a little bit of eschar on the surface. The right medial lower leg is less excoriated and red and he has had substantially less drainage. The amount of slough on the open areas of his wounds has decreased markedly and there is no longer any odor. The back of his right lower leg that was newly open last week has dried up, but is not completely closed yet. 04/15/2023: The improvement continues with significantly less drainage, resulting in less excoriation and irritation. The back of his right lower leg is closed, as is the left dorsal foot wound. The open areas on the right medial leg are smaller. 04/22/2023: His drainage has diminished significantly. The tissue maceration and excoriation surrounding the right medial leg wound has also improved substantially. There is slough on all of the open wound surfaces. He does have a bit of an odor  coming from his wound today, but there is no purulent drainage nor any significant discoloration of the drainage in his VAC. 04/29/2023: He has a new wound on his left lower anterior tibial surface. This was a blister that came up and then ruptured. There is thin slough on the wound surface but no concern for infection. He has had significantly diminished drainage from his right medial leg ulcers. He was previously going through 4 canisters a day secondary to drainage and this week, he has used only 1 for the entire week. The open areas on his right medial leg are smaller and fewer in number. There is slough on all of the surfaces. No malodor appreciated. Patient History Information obtained from Patient, Chart. Family History Cancer - Mother, Heart Disease - Father,Mother, Hypertension - Father,Siblings, Lung Disease - Siblings, No family history of Diabetes, Hereditary Spherocytosis, Kidney Disease, Seizures, Stroke, Thyroid Problems, Tuberculosis. Social History Former smoker - quit 1991, Marital Status - Married, Alcohol Use - Moderate, Drug Use - No History, Caffeine Use - Daily - coffee. Medical History Eyes Patient has history of Cataracts - right eye removed Denies history of Glaucoma, Optic Neuritis Ear/Nose/Mouth/Throat Denies history of Chronic sinus problems/congestion, Middle ear problems Hematologic/Lymphatic Patient has history of Anemia - macrocytic, Lymphedema Dilger, Tanis ([id number]) [id number] Page 13 of 17 Cardiovascular Patient has history of Congestive Heart Failure, Deep Vein Thrombosis - left leg, Hypertension, Peripheral Venous Disease Endocrine Denies history of Type I Diabetes, Type II Diabetes Genitourinary Denies history of End Stage Renal Disease Integumentary (Skin) Denies history of History of Burn Oncologic Denies history of Received Chemotherapy, Received Radiation Psychiatric Denies history of Anorexia/bulimia,  Confinement Anxiety Hospitalization/Surgery History - cervical fusion. - lumbar surgery. - coronary stent placement. - lasik eye surgery. - hydrocele excision. Medical A Surgical History Notes nd Constitutional Symptoms (General Health) obesity Ear/Nose/Mouth/Throat hard of hearing Respiratory frozen diaphragm right Cardiovascular hyperlipidemia Musculoskeletal myositis, lumbar DDD, spinal stenosis, cervical facet joint syndrome Objective Constitutional Slightly hypertensive. no acute distress. Vitals Time Taken: 10:58 AM, Height: 71 in, Weight: 267 lbs, BMI: 37.2, Temperature: 97.9 F, Pulse: 65 bpm, Respiratory Rate: 18 breaths/min, Blood Pressure: 148/88 mmHg. Respiratory Normal work of breathing on  room air. General Notes: 04/29/2023: He has a new wound on his left lower anterior tibial surface. This was a blister that came up and then ruptured. There is thin slough on the wound surface but no concern for infection. He has had significantly diminished drainage from his right medial leg ulcers. The open areas on his right medial leg are smaller and fewer in number. There is slough on all of the surfaces. No malodor appreciated. Integumentary (Hair, Skin) Wound #10 status is Open. Original cause of wound was Blister. The date acquired was: 04/26/2023. The wound is located on the Left,Anterior Lower Leg. The wound measures 5.6cm length x 4.4cm width x 0.1cm depth; 19.352cm^2 area and 1.935cm^3 volume. There is Fat Layer (Subcutaneous Tissue) exposed. There is no tunneling or undermining noted. There is a medium amount of serosanguineous drainage noted. The wound margin is flat and intact. There is large (67-100%) red granulation within the wound bed. There is no necrotic tissue within the wound bed. The periwound skin appearance had no abnormalities noted for texture. The periwound skin appearance had no abnormalities noted for color. The periwound skin appearance did not exhibit:  Dry/Scaly, Maceration. Periwound temperature was noted as No Abnormality. Wound #3 status is Open. Original cause of wound was Gradually Appeared. The date acquired was: 12/07/2021. The wound has been in treatment 63 weeks. The wound is located on the Right,Medial Lower Leg. The wound measures 7.5cm length x 5.3cm width x 0.5cm depth; 31.22cm^2 area and 15.61cm^3 volume. There is Fat Layer (Subcutaneous Tissue) exposed. There is no tunneling or undermining noted. There is a medium amount of serosanguineous drainage noted. The wound margin is flat and intact. There is small (1-33%) red granulation within the wound bed. There is a large (67-100%) amount of necrotic tissue within the wound bed including Adherent Slough. The periwound skin appearance had no abnormalities noted for texture. The periwound skin appearance had no abnormalities noted for moisture. The periwound skin appearance exhibited: Rubor. The periwound skin appearance did not exhibit: Erythema. Periwound temperature was noted as No Abnormality. The periwound has tenderness on palpation. Wound #6 status is Open. Original cause of wound was Gradually Appeared. The date acquired was: 05/12/2022. The wound has been in treatment 50 weeks. The wound is located on the Right,Anterior Lower Leg. The wound measures 1.4cm length x 0.2cm width x 0.3cm depth; 0.22cm^2 area and 0.066cm^3 volume. There is Fat Layer (Subcutaneous Tissue) exposed. There is no tunneling or undermining noted. There is a medium amount of serosanguineous drainage noted. The wound margin is epibole. There is no granulation within the wound bed. There is no necrotic tissue within the wound bed. The periwound skin appearance had no abnormalities noted for texture. The periwound skin appearance had no abnormalities noted for moisture. The periwound skin appearance exhibited: Hemosiderin Staining. Periwound temperature was noted as No Abnormality. 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 There was a Selective/Open Wound Non-Viable Tissue Debridement with a total area of 19.34 sq cm performed by Duanne Guess, MD.  With the following instrument(s): Curette to remove Non-Viable tissue/material. Material removed includes [facility] after achieving pain control using Lidocaine 4% Topical Solution. No specimens were taken. A time out was conducted at 11:47, prior to the start of the procedure. A Minimum amount of bleeding was controlled with Pressure. The procedure was tolerated well with a pain level of 0 throughout and a pain level of 0 following the procedure. Post Debridement Measurements: 5.6cm length x 4.4cm width x 0.1cm depth; 1.935cm^3 volume. Character of Wound/Ulcer Post Debridement is improved. Post procedure Diagnosis Wound #10: Same as Pre-Procedure Pre-procedure diagnosis of Wound #10 is a Lymphedema located on the Left,Anterior Lower Leg . There was a Three Layer Compression Therapy Procedure by Hunt Oris, RN. Post procedure Diagnosis Wound #10: Same as Pre-Procedure Wound #3 Pre-procedure diagnosis of Wound #3 is a Lymphedema located on the Right,Medial Lower Leg . There was a Selective/Open Wound Non-Viable Tissue Debridement with a total area of 7.8 sq cm performed by Duanne Guess, MD. With the following instrument(s):  Curette to remove Non-Viable tissue/material. Material removed includes [facility] after achieving pain control using Lidocaine 4% Topical Solution. A time out was conducted at 11:49, prior to the start of the procedure. A Minimum amount of bleeding was controlled with Pressure. The procedure was tolerated well with a pain level of 3 throughout and a pain level of 3 following the procedure. Post Debridement Measurements: 7.5cm length x 5.3cm width x 0.5cm depth; 15.61cm^3 volume. Character of Wound/Ulcer Post Debridement is improved. Post procedure Diagnosis Wound #3: Same as Pre-Procedure Pre-procedure diagnosis of Wound #3 is a Lymphedema located on the Right,Medial Lower Leg . There was a Three Layer Compression Therapy Procedure by Hunt Oris, RN. Post procedure Diagnosis Wound #3: Same as Pre-Procedure Plan Follow-up Appointments: Return Appointment in 1 week. - Dr. [name] RM 1 05/06/2023 @ 1100 Nurse Visit: - Tuesday 10/08 @ 0315 pm Anesthetic: (In clinic) Topical Lidocaine 4% applied to wound bed Bathing/ Shower/ Hygiene: May shower with protection but do not get wound dressing(s) wet. Protect dressing(s) with water repellant cover (for example, large plastic bag) or a cast cover and may then take shower. Negative Presssure Wound Therapy: Wound Vac to wound continuously at 195mm/hg pressure Black Foam - peel and place Edema Control - Lymphedema / SCD / Other: Lymphedema Pumps. Use Lymphedema pumps on leg(s) 2-3 times a day for 45-60 minutes. If wearing any wraps or hose, do not remove them. Continue exercising as instructed. Elevate legs to the level of the heart or above for 30 minutes daily and/or when sitting for 3-4 times a day throughout the day. - throughout the day Avoid standing for long periods of time. Exercise regularly The following medication(s) was prescribed: levofloxacin oral 750 mg tablet 1 tab PO daily starting 04/29/2023 WOUND #10: - Lower Leg Wound Laterality: Left,  Anterior Peri-Wound Care: Sween Lotion (Moisturizing lotion) 1 x Per Week/30 Days Discharge Instructions: Apply moisturizing lotion as directed Prim Dressing: Maxorb Extra Ag+ Alginate Dressing, 2x2 (in/in) 1 x Per Week/30 Days ary Discharge Instructions: Apply to wound bed as instructed Secondary Dressing: ABD Pad, 8x10 1 x Per Week/30 Days Discharge Instructions: Apply over primary dressing as directed. Com pression Wrap: ThreePress (3 layer compression wrap) 1 x Per Week/30 Days Discharge Instructions: Apply three layer compression as directed. WOUND #3: - Lower Leg Wound Laterality: Right, Medial Peri-Wound Care: Skin Prep 1 x Per Week/30 Days Discharge Instructions: cavelon to macerated wound margins Peri-Wound Care: Sween Lotion (Moisturizing lotion) 1 x  Per Week/30 Days Discharge Instructions: Apply moisturizing lotion as directed Topical: Gentamicin 1 x Per Week/30 Days Discharge Instructions: to open areas Topical: Mupirocin Ointment 1 x Per Week/30 Days Discharge Instructions: Apply Mupirocin (Bactroban) as instructed Prim Dressing: Maxorb Extra Ag+ Alginate Dressing, 4x4.75 (in/in) 1 x Per Week/30 Days ary Discharge Instructions: Apply to excoriated weeping areas Secondary Dressing: Zetuvit Plus 4x8 in 1 x Per Week/30 Days Discharge Instructions: Apply over primary dressing as directed. Com pression Wrap: ThreePress (3 layer compression wrap) 1 x Per Week/30 Days Discharge Instructions: Apply three layer compression as directed. TREVEON, BOURCIER ([id number]) [id number] Page 15 of 17 WOUND #6: - Lower Leg Wound Laterality: Right, Anterior Peri-Wound Care: Sween Lotion (Moisturizing lotion) 1 x Per Week/30 Days Discharge Instructions: Apply moisturizing lotion as directed Prim Dressing: Maxorb Extra Ag+ Alginate Dressing, 2x2 (in/in) 1 x Per Week/30 Days ary Discharge Instructions: Apply to wound bed as instructed Secondary Dressing: ABD Pad, 8x10 1  x Per Week/30 Days Discharge Instructions: Apply over primary dressing as directed. Com pression Wrap: ThreePress (3 layer compression wrap) 1 x Per Week/30 Days Discharge Instructions: Apply three layer compression as directed. 04/29/2023: He has a new wound on his left lower anterior tibial surface. This was a blister that came up and then ruptured. There is thin slough on the wound surface but no concern for infection. He has had significantly diminished drainage from his right medial leg ulcers. He was previously going through 4 canisters a day secondary to drainage and this week, he has used only 1 for the entire week. The open areas on his right medial leg are smaller and fewer in number. There is slough on all of the surfaces. No malodor appreciated. I used a curette to debride the slough from both the right and left leg ulcers. The patient would like to stay out of the [facility] this weekend to see how he does, since the drainage has decreased markedly. I think the main reason for this is that we are controlling the Pseudomonas infection, so I have extended his levofloxacin again. We will apply silver alginate to all sites and bilateral 3-layer compression wraps. Follow up for a nurse visit on Tuesday and see me in 1 week. Electronic Signature(s) Signed: 04/29/2023 12:34:56 PM By: Hunt Oris Signed: 04/29/2023 12:52:01 PM By: Duanne Guess MD FACS Previous Signature: 04/29/2023 12:13:51 PM Version By: Duanne Guess MD FACS Entered By: Hunt Oris on 04/29/2023 12:34:39 -------------------------------------------------------------------------------- HxROS Details Patient Name: Date of Service: NICHALAS, COIN [name] 04/29/2023 11:00 A M Medical Record Number: [id number] Patient Account Number: [id number] Date of Birth/Sex: Treating RN: 02/08/1951 (72 y.o. M) Primary Care Provider: [name] Other Clinician: Referring Provider: Treating Provider/Extender: [name] in Treatment: 37 Information Obtained From Patient Chart Constitutional Symptoms (General Health) Medical History: Past Medical History Notes: obesity Eyes Medical History: Positive for: Cataracts - right eye removed Negative for: Glaucoma; Optic Neuritis Ear/Nose/Mouth/Throat Medical History: Negative for: Chronic sinus problems/congestion; Middle ear problems Past Medical History Notes: hard of hearing Hematologic/Lymphatic Medical History: Positive for: Anemia - macrocytic; Lymphedema Respiratory Medical History: Past Medical History Notes: frozen diaphragm right NIKOLAS, CASHER ([id number]) [id number] Page 16 of 17 Cardiovascular Medical History: Positive for: Congestive Heart Failure; Deep Vein Thrombosis - left leg; Hypertension; Peripheral Venous Disease Past Medical History Notes: hyperlipidemia Endocrine Medical History: Negative for: Type I Diabetes; Type II Diabetes Genitourinary Medical History: Negative for: End Stage Renal Disease Integumentary (Skin) Medical History: Negative for: History of Burn Musculoskeletal Medical  History: Past Medical History Notes: myositis, lumbar DDD, spinal stenosis, cervical facet joint syndrome Oncologic Medical History: Negative for: Received Chemotherapy; Received Radiation Psychiatric Medical History: Negative for: Anorexia/bulimia; Confinement Anxiety HBO Extended History Items Eyes: Cataracts Immunizations Pneumococcal Vaccine: Received Pneumococcal Vaccination: Yes Received Pneumococcal Vaccination On or After 60th Birthday: Yes Implantable Devices No devices added Hospitalization / Surgery History Type of Hospitalization/Surgery cervical fusion lumbar surgery coronary stent placement lasik eye surgery hydrocele excision Family and Social History Cancer: Yes - Mother; Diabetes: No; Heart Disease: Yes - Father,Mother; Hereditary Spherocytosis: No; Hypertension: Yes  - Father,Siblings; Kidney Disease: No; Lung Disease: Yes - Siblings; Seizures: No; Stroke: No; Thyroid Problems: No; Tuberculosis: No; Former smoker - quit 1991; Marital Status - Married; Alcohol Use: Moderate; Drug Use: No History; Caffeine Use: Daily - coffee; Financial Concerns: No; Food, Clothing or Shelter Needs: No; Support System Lacking: No; Transportation Concerns: No Electronic Signature(s) Signed: 04/29/2023 12:15:26 PM By: Duanne Guess MD FACS Entered By: Duanne Guess on 04/29/2023 12:06:39 [name] ([id number]) [id number] Page 17 of 17 -------------------------------------------------------------------------------- SuperBill Details Patient Name: Date of Service: HUNTER, BACHAR 04/29/2023 Medical Record Number: [id number] Patient Account Number: [id number] Date of Birth/Sex: Treating RN: April 01, 1951 (72 y.o. 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 Lymphedema, not elsewhere classified I87.2 Venous insufficiency (chronic) (peripheral) Z86.718 Personal history of other venous thrombosis and embolism I10 Essential (primary) hypertension I25.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 angina pectoris Facility Procedures : CPT4 Code: [id number] Description: 97597 - DEBRIDE WOUND 1ST 20 SQ CM OR < ICD-10 Diagnosis Description 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 L97.822 Non-pressure chronic ulcer of other part of left lower leg with fat layer  exposed Modifier: d Quantity: 1 : CPT4 Code:  [id number] Description: 97598 - DEBRIDE WOUND EA ADDL 20 SQ CM ICD-10 Diagnosis Description 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 L97.822 Non-pressure chronic ulcer of other part of left lower leg with fat layer  exposed Modifier: d Quantity: 1 Physician Procedures : CPT4 Code Description Modifier [id number] - WC PHYS LEVEL 4 - EST PT 25 ICD-10 Diagnosis Description 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ed L97.822 Non-pressure chronic ulcer of other part of left  lower leg with fat layer exposed 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.0 Lymphedema, not elsewhere classified Quantity: 1 : [id number] 97597 - WC PHYS DEBR WO ANESTH 20 SQ CM ICD-10 Diagnosis Description 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ed L97.822 Non-pressure chronic ulcer of other part of left lower leg with fat layer  exposed Quantity: 1 : [id number] 97598 - WC PHYS DEBR WO ANESTH EA ADD 20 CM ICD-10 Diagnosis Description 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ed L97.822 Non-pressure chronic ulcer of other part of left lower leg with fat  layer exposed Quantity: 1 Electronic Signature(s) Signed: 04/29/2023 12:15:08 PM By: Duanne Guess MD FACS Entered By: Duanne Guess on 04/29/2023 12:15:08

## 2023-04-29 NOTE — Progress Notes (Signed)
WM, SAHAGUN ([id number]) [id number] Page 1 of 11 Visit Report for 04/29/2023 Arrival Information Details Patient Name: Date of Service: [name], [name] 04/29/2023 11:00 A M Medical Record Number: [id number] Patient Account Number: [id number] Date of Birth/Sex: Treating RN: Jun 04, 1951 (72 y.o. M) Primary Care Treasure Ochs: [name] Other Clinician: Referring Xian Apostol: Treating Emaline Karnes/Extender: [name] in Treatment: 68 Visit Information History Since Last Visit Added or deleted any medications: No Patient Arrived: Wheel Chair Any new allergies or adverse reactions: No Arrival Time: 10:57 Had a fall or experienced change in No Accompanied By: self activities of daily living that may affect Transfer Assistance: None risk of falls: Patient Identification Verified: Yes Signs or symptoms of abuse/neglect since last visito No Secondary Verification Process Completed: Yes Hospitalized since last visit: No Patient Requires Transmission-Based Precautions: No Implantable device outside of the clinic excluding No Patient Has Alerts: Yes cellular tissue based products placed in the center Patient Alerts: R ABI= N/C, TBI= .75 since last visit: L ABI =N/C, TBI = .57 Pain Present Now: No Electronic Signature(s) Signed: 04/29/2023 11:15:14 AM By: Dayton Scrape Entered By: Dayton Scrape on 04/29/2023 10:58:05 -------------------------------------------------------------------------------- Compression Therapy Details Patient Name: Date of Service: [name], [name] 04/29/2023 11:00 A M Medical Record Number: [id number] Patient Account Number: [id number] Date of Birth/Sex: Treating RN: 13-Aug-1950 (72 y.o. [name] Primary Care Idris Edmundson: [name] Other Clinician: Referring Alexica Schlossberg: Treating Zalen Sequeira/Extender: [name] in Treatment: 63 Compression Therapy Performed for Wound Assessment: Wound #10  Left,Anterior Lower Leg Performed By: Vanna Scotland, RN Compression Type: Three Layer Post Procedure Diagnosis Same as Pre-procedure Electronic Signature(s) Signed: 04/29/2023 12:34:56 PM By: Hunt Oris Entered By: Hunt Oris on 04/29/2023 12:25:55 Compression Therapy Details -------------------------------------------------------------------------------- [name] ([id number]) [id number] Page 2 of 11 Patient Name: Date of Service: [name], [name] 04/29/2023 11:00 A M Medical Record Number: [id number] Patient Account Number: [id number] Date of Birth/Sex: Treating RN: Dec 02, 1950 (72 y.o. [name] Primary Care Cameron Schwinn: [name] Other Clinician: Referring Margaretta Chittum: Treating Milany Geck/Extender: [name] in Treatment: 63 Compression Therapy Performed for Wound Assessment: Wound #3 Right,Medial Lower Leg Performed By: Vanna Scotland, RN Compression Type: Three Layer Post Procedure Diagnosis Same as Pre-procedure Electronic Signature(s) Signed: 04/29/2023 12:34:56 PM By: Hunt Oris Entered By: Hunt Oris on 04/29/2023 12:25:55 -------------------------------------------------------------------------------- Encounter Discharge Information Details Patient Name: Date of Service: [name] 04/29/2023 11:00 A M Medical Record Number: [id number] Patient Account Number: [id number] Date of Birth/Sex: Treating RN: 08/17/1950 (72 y.o. [name] Primary Care Elsia Lasota: [name] Other Clinician: Referring Gabryela Kimbrell: Treating Arien Morine/Extender: [name] in Treatment: 6 Encounter Discharge Information Items Post Procedure Vitals Discharge Condition: Stable Temperature (F): 97.9 Ambulatory Status: Wheelchair Pulse (bpm): 65 Discharge Destination: Home Respiratory Rate (breaths/min): 18 Transportation: Private Auto Blood Pressure (mmHg): 148/88 Accompanied By:  self Schedule Follow-up Appointment: Yes Clinical Summary of Care: Electronic Signature(s) Signed: 04/29/2023 12:22:13 PM By: Hunt Oris Entered By: Hunt Oris on 04/29/2023 12:12:31 -------------------------------------------------------------------------------- Lower Extremity Assessment Details Patient Name: Date of Service: [name], [name] 04/29/2023 11:00 A M Medical Record Number: [id number] Patient Account Number: [id number] Date of Birth/Sex: Treating RN: 05/04/1951 (72 y.o. [name] Primary Care Chrisann Melaragno: [name] Other Clinician: Referring Chayson Charters: Treating Oviya Ammar/Extender: [name] in Treatment: 63 Edema Assessment Assessed: Kyra Searles: No] Franne Forts: No] Edema: [Left: Yes] [Right: Yes] Calf Left: Right: Point of Measurement: From Medial Instep 40.8 cm 41 cm Alf, [place]hol[place] ([id number]) [id number].pdf  Page 3 of 11 Ankle Left: Right: Point of Measurement: From Medial Instep 25 cm 27 cm Vascular Assessment Pulses: Dorsalis Pedis Palpable: [Right:Yes] Extremity colors, hair growth, and conditions: Extremity Color: [Left:Hyperpigmented] [Right:Hyperpigmented] Hair Growth on Extremity: [Left:Yes] [Right:Yes] Temperature of Extremity: [Left:Warm] [Right:Warm] Capillary Refill: [Left:< 3 seconds] [Right:< 3 seconds] Dependent Rubor: [Left:No No] [Right:No No] Toe Nail Assessment Left: Right: Thick: No No Discolored: No No Deformed: No No Improper Length and Hygiene: No No Electronic Signature(s) Signed: 04/29/2023 12:22:13 PM By: Hunt Oris Entered By: Hunt Oris on 04/29/2023 11:24:59 -------------------------------------------------------------------------------- Multi Wound Chart Details Patient Name: Date of Service: [name] 04/29/2023 11:00 A M Medical Record Number: [id number] Patient Account Number: [id number] Date of Birth/Sex: Treating RN: [birth year] (72 y.o. M) Primary Care Norene Oliveri: [name] Other Clinician: Referring Aviance Cooperwood: Treating Keriana Sarsfield/Extender: [name] in Treatment: 43 Vital Signs Height(in): 71 Pulse(bpm): 65 Weight(lbs): 267 Blood Pressure(mmHg): 148/88 Body Mass Index(BMI): 37.2 Temperature(F): 97.9 Respiratory Rate(breaths/min): 18 [10:Photos:] Left, Anterior Lower Leg Right, Medial Lower Leg Right, Anterior Lower Leg Wound Location: Blister Gradually Appeared Gradually Appeared Wounding Event: Lymphedema Lymphedema Calcinosis Primary Etiology: Cataracts, Anemia, Lymphedema, Cataracts, Anemia, Lymphedema, Cataracts, Anemia, Lymphedema, Comorbid History: Congestive Heart Failure, Deep Vein Congestive Heart Failure, Deep Vein Congestive Heart Failure, Deep Vein Thrombosis, Hypertension, Peripheral Thrombosis, Hypertension, Peripheral Thrombosis, Hypertension, Peripheral Venous Disease Venous Disease Venous Disease 04/26/2023 12/07/2021 05/12/2022 Date Acquired: 0 63 50 Weeks of Treatment: Open Open Open Wound Status: No No No Wound Recurrence: [name], [name] ([id number]) [id number] Page 4 of 11 No No Yes Clustered Wound: N/A N/A 2 Clustered Quantity: 5.6x4.4x0.1 7.5x5.3x0.5 1.4x0.2x0.3 Measurements L x W x D (cm) 19.352 31.22 0.22 A (cm) : rea 1.935 15.61 0.066 Volume (cm) : N/A -484.50% 6.80% % Reduction in Area: N/A -630.80% -175.00% % Reduction in Volume: Full Thickness Without Exposed Full Thickness With Exposed Support Full Thickness Without Exposed Classification: Support Structures Structures Support Structures Medium Medium Medium Exudate A mount: Serosanguineous Serosanguineous Serosanguineous Exudate Type: red, brown red, brown red, brown Exudate Color: Flat and Intact Flat and Intact Epibole Wound Margin: Large (67-100%) Small (1-33%) None Present (0%) Granulation A mount: Red Red N/A Granulation Quality: None Present (0%) Large (67-100%) None Present  (0%) Necrotic A mount: Fat Layer (Subcutaneous Tissue): Yes Fat Layer (Subcutaneous Tissue): Yes Fat Layer (Subcutaneous Tissue): Yes Exposed Structures: Fascia: No Fascia: No Tendon: No Tendon: No Muscle: No Muscle: No Joint: No Joint: No Bone: No Bone: No Small (1-33%) Medium (34-66%) Large (67-100%) Epithelialization: Debridement - Selective/Open Wound Debridement - Selective/Open Wound N/A Debridement: Pre-procedure Verification/Time Out 11:47 11:49 N/A Taken: Lidocaine 4% Topical Solution Lidocaine 4% Topical Solution N/A Pain Control: [employer] N/A Tissue Debrided: Non-Viable Tissue Non-Viable Tissue N/A Level: 19.34 7.8 N/A Debridement A (sq cm): rea Curette Curette N/A Instrument: Minimum Minimum N/A Bleeding: Pressure Pressure N/A Hemostasis A chieved: 0 3 N/A Procedural Pain: 0 3 N/A Post Procedural Pain: Procedure was tolerated well Procedure was tolerated well N/A Debridement Treatment Response: 5.6x4.4x0.1 7.5x5.3x0.5 N/A Post Debridement Measurements L x W x D (cm) 1.935 15.61 N/A Post Debridement Volume: (cm) No Abnormalities Noted Excoriation: No No Abnormalities Noted Periwound Skin Texture: Maceration: No Maceration: Yes No Abnormalities Noted Periwound Skin Moisture: Dry/Scaly: No Atrophie Blanche: No Rubor: Yes Hemosiderin Staining: Yes Periwound Skin Color: Cyanosis: No Erythema: No Ecchymosis: No Erythema: No No Abnormality No Abnormality No Abnormality Temperature: N/A Yes N/A Tenderness on Palpation: Debridement Debridement N/A Procedures Performed: Treatment Notes Electronic Signature(s) Signed: 04/29/2023  12:02:06 PM By: Duanne Guess MD FACS Entered By: Duanne Guess on 04/29/2023 12:02:06 -------------------------------------------------------------------------------- Multi-Disciplinary Care Plan Details Patient Name: Date of Service: [name], [name] 04/29/2023 11:00 A M Medical Record Number:  [id number] Patient Account Number: [id number] Date of Birth/Sex: Treating RN: 12/15/1950 (72 y.o. [name] Primary Care Konrad Hoak: [name] Other Clinician: Referring Petra Sargeant: Treating Kinya Meine/Extender: [name] in Treatment: 71 Multidisciplinary Care Plan reviewed with physician Active Inactive Venous Leg Ulcer SAVAN, RUTA ([id number]) [id number] Page 5 of 11 Nursing Diagnoses: Knowledge deficit related to disease process and management Potential for venous Insuffiency (use before diagnosis confirmed) Goals: Patient will maintain optimal edema control Date Initiated: 02/17/2022 Target Resolution Date: 05/20/2023 Goal Status: Active Interventions: Assess peripheral edema status every visit. Compression as ordered Provide education on venous insufficiency Treatment Activities: Non-invasive vascular studies : 02/17/2022 Therapeutic compression applied : 02/17/2022 Notes: Wound/Skin Impairment Nursing Diagnoses: Impaired tissue integrity Knowledge deficit related to ulceration/compromised skin integrity Goals: Patient/caregiver will verbalize understanding of skin care regimen Date Initiated: 02/17/2022 Target Resolution Date: 05/20/2023 Goal Status: Active Ulcer/skin breakdown will have a volume reduction of 30% by week 4 Date Initiated: 02/17/2022 Date Inactivated: 03/10/2022 Target Resolution Date: 03/10/2022 Unmet Reason: infection being treated, Goal Status: Unmet waiting on derm appte Ulcer/skin breakdown will have a volume reduction of 50% by week 8 Date Initiated: 03/10/2022 Date Inactivated: 04/14/2022 Target Resolution Date: 04/07/2022 Goal Status: Unmet Unmet Reason: calcinosis Ulcer/skin breakdown will have a volume reduction of 80% by week 12 Date Initiated: 04/14/2022 Date Inactivated: 05/05/2022 Target Resolution Date: 05/05/2022 Unmet Reason: infection, chronic Goal Status:  Unmet condition Interventions: Assess patient/caregiver ability to obtain necessary supplies Assess patient/caregiver ability to perform ulcer/skin care regimen upon admission and as needed Assess ulceration(s) every visit Provide education on ulcer and skin care Treatment Activities: Skin care regimen initiated : 02/17/2022 Topical wound management initiated : 02/17/2022 Notes: Electronic Signature(s) Signed: 04/29/2023 12:22:13 PM By: Hunt Oris Signed: 04/29/2023 12:41:20 PM By: Zenaida Deed RN, BSN Entered By: Hunt Oris on 04/29/2023 12:05:00 -------------------------------------------------------------------------------- Pain Assessment Details Patient Name: Date of Service: [name] 04/29/2023 11:00 A M Medical Record Number: [id number] Patient Account Number: [id number] Date of Birth/Sex: Treating RN: [birth year] (72 y.o. M) Primary Care Emaly Boschert: [name] Other Clinician: Referring Eliot Bencivenga: Treating Devanee Pomplun/Extender: [name] in Treatment: [address], [place] ([id number]) [id number] Page 6 of 11 Active Problems Location of Pain Severity and Description of Pain Patient Has Paino No Site Locations Pain Management and Medication Current Pain Management: Electronic Signature(s) Signed: 04/29/2023 11:15:14 AM By: Dayton Scrape Entered By: Dayton Scrape on 04/29/2023 10:58:40 -------------------------------------------------------------------------------- Patient/Caregiver Education Details Patient Name: Date of Service: [name], [name] 10/4/2024andnbsp11:00 A M Medical Record Number: [id number] Patient Account Number: [id number] Date of Birth/Gender: Treating RN: [birth year] (72 y.o. [name] Primary Care Physician: [name] Other Clinician: Referring Physician: Treating Physician/Extender: [name] in Treatment: 4 Education Assessment Education Provided  To: Patient Education Topics Provided Infection: Methods: Explain/Verbal Responses: Reinforcements needed, State content correctly Venous: Methods: Explain/Verbal Responses: Reinforcements needed, State content correctly Wound/Skin Impairment: Methods: Explain/Verbal Responses: Reinforcements needed, State content correctly Electronic Signature(s) Signed: 04/29/2023 12:41:20 PM By: Zenaida Deed RN, BSN Entered By: Zenaida Deed on 04/29/2023 09:59:52 [name] ([id number]) [id number] Page 7 of 11 -------------------------------------------------------------------------------- Wound Assessment Details Patient Name: Date of Service: [name], [name] 04/29/2023 11:00 A M Medical Record Number: [id number] Patient Account Number: [id number] Date of Birth/Sex: Treating RN: [birth year] (72 y.o. [name],  Leah Primary Care Duyen Beckom: [name] Other Clinician: Referring Kimaya Whitlatch: Treating Tyashia Morrisette/Extender: [name] in Treatment: 57 Wound Status Wound Number: 10 Primary Lymphedema Etiology: Wound Location: Left, Anterior Lower Leg Wound Open Wounding Event: Blister Status: Date Acquired: 04/26/2023 Comorbid Cataracts, Anemia, Lymphedema, Congestive Heart Failure, Deep Weeks Of Treatment: 0 History: Vein Thrombosis, Hypertension, Peripheral Venous Disease Clustered Wound: No Photos Wound Measurements Length: (cm) 5.6 Width: (cm) 4.4 Depth: (cm) 0.1 Area: (cm) 19.352 Volume: (cm) 1.935 % Reduction in Area: % Reduction in Volume: Epithelialization: Small (1-33%) Tunneling: No Undermining: No Wound Description Classification: Full Thickness Without Exposed Suppor Wound Margin: Flat and Intact Exudate Amount: Medium Exudate Type: Serosanguineous Exudate Color: red, brown t Structures Foul Odor After Cleansing: No Slough/Fibrino Yes Wound Bed Granulation Amount: Large (67-100%) Exposed Structure Granulation  Quality: Red Fat Layer (Subcutaneous Tissue) Exposed: Yes Necrotic Amount: None Present (0%) Periwound Skin Texture Texture Color No Abnormalities Noted: Yes No Abnormalities Noted: Yes Moisture Temperature / Pain No Abnormalities Noted: No Temperature: No Abnormality Dry / Scaly: No Maceration: No Treatment Notes Wound #10 (Lower Leg) Wound Laterality: Left, Anterior Cleanser Peri-Wound Care Sween Lotion (Moisturizing lotion) GENEROSO, CROPPER ([id number]) [id number] Page 8 of 11 Discharge Instruction: Apply moisturizing lotion as directed Topical Primary Dressing Maxorb Extra Ag+ Alginate Dressing, 2x2 (in/in) Discharge Instruction: Apply to wound bed as instructed Secondary Dressing ABD Pad, 8x10 Discharge Instruction: Apply over primary dressing as directed. Secured With Compression Wrap ThreePress (3 layer compression wrap) Discharge Instruction: Apply three layer compression as directed. Compression Stockings Add-Ons Electronic Signature(s) Signed: 04/29/2023 12:22:13 PM By: Hunt Oris Signed: 04/29/2023 12:41:20 PM By: Zenaida Deed RN, BSN Entered By: Zenaida Deed on 04/29/2023 11:38:23 -------------------------------------------------------------------------------- Wound Assessment Details Patient Name: Date of Service: [name], [name] 04/29/2023 11:00 A M Medical Record Number: [id number] Patient Account Number: [id number] Date of Birth/Sex: Treating RN: 05-Feb-1951 (72 y.o. [name], [name] Primary Care Leianna Barga: [name] Other Clinician: Referring Micheil Klaus: Treating Naithen Rivenburg/Extender: [name] in Treatment: 63 Wound Status Wound Number: 3 Primary Lymphedema Etiology: Wound Location: Right, Medial Lower Leg Wound Open Wounding Event: Gradually Appeared Status: Date Acquired: 12/07/2021 Comorbid Cataracts, Anemia, Lymphedema, Congestive Heart Failure, Deep Weeks Of Treatment: 63 History: Vein  Thrombosis, Hypertension, Peripheral Venous Disease Clustered Wound: No Photos Wound Measurements Length: (cm) Width: (cm) Depth: (cm) Area: (cm) Volume: (cm) 7.5 % Reduction in Area: -484.5% 5.3 % Reduction in Volume: -630.8% 0.5 Epithelialization: Medium (34-66%) 31.22 Tunneling: No 15.61 Undermining: No Wound Description Classification: Full Thickness With Exposed Support Structures Ruffolo, Nesanel ([id number]) Wound Margin: Flat and Intact Exudate Amount: Medium Exudate Type: Serosanguineous Exudate Color: red, brown Foul Odor After Cleansing: No [id number] Page 9 of 11 Slough/Fibrino Yes Wound Bed Granulation Amount: Small (1-33%) Exposed Structure Granulation Quality: Red Fascia Exposed: No Necrotic Amount: Large (67-100%) Fat Layer (Subcutaneous Tissue) Exposed: Yes Necrotic Quality: Adherent Slough Tendon Exposed: No Muscle Exposed: No Joint Exposed: No Bone Exposed: No Periwound Skin Texture Texture Color No Abnormalities Noted: Yes No Abnormalities Noted: No Erythema: No Moisture Rubor: Yes No Abnormalities Noted: Yes Temperature / Pain Temperature: No Abnormality Tenderness on Palpation: Yes Treatment Notes Wound #3 (Lower Leg) Wound Laterality: Right, Medial Cleanser Peri-Wound Care Skin Prep Discharge Instruction: cavelon to macerated wound margins Sween Lotion (Moisturizing lotion) Discharge Instruction: Apply moisturizing lotion as directed Topical Gentamicin Discharge Instruction: to open areas Mupirocin Ointment Discharge Instruction: Apply Mupirocin (Bactroban) as instructed Primary Dressing Maxorb Extra Ag+ Alginate Dressing, 4x4.75 (in/in) Discharge Instruction: Apply to  excoriated weeping areas Secondary Dressing Zetuvit Plus 4x8 in Discharge Instruction: Apply over primary dressing as directed. Secured With Compression Wrap ThreePress (3 layer compression wrap) Discharge Instruction: Apply three layer  compression as directed. Compression Stockings Add-Ons Electronic Signature(s) Signed: 04/29/2023 12:22:13 PM By: Hunt Oris Signed: 04/29/2023 12:41:20 PM By: Zenaida Deed RN, BSN Entered By: Zenaida Deed on 04/29/2023 11:35:44 Wound Assessment Details -------------------------------------------------------------------------------- [name] ([id number]) [id number] Page 10 of 11 Patient Name: Date of Service: [name], [name] 04/29/2023 11:00 A M Medical Record Number: [id number] Patient Account Number: [id number] Date of Birth/Sex: Treating RN: [birth year] (72 y.o. [name], [name] Primary Care Hydeia Mcatee: [name] Other Clinician: Referring Savalas Monje: Treating Zadin Lange/Extender: [name] in Treatment: 63 Wound Status Wound Number: 6 Primary Calcinosis Etiology: Wound Location: Right, Anterior Lower Leg Wound Open Wounding Event: Gradually Appeared Status: Date Acquired: 05/12/2022 Comorbid Cataracts, Anemia, Lymphedema, Congestive Heart Failure, Deep Weeks Of Treatment: 50 History: Vein Thrombosis, Hypertension, Peripheral Venous Disease Clustered Wound: Yes Photos Wound Measurements Length: (cm) 1.4 % Reduction in Area: 6.8% Width: (cm) 0.2 % Reduction in Volume: -175% Depth: (cm) 0.3 Epithelialization: Large (67-100%) Clustered Quantity: 2 Tunneling: No Area: (cm) 0.22 Undermining: No Volume: (cm) 0.066 Wound Description Classification: Full Thickness Without Exposed Support Structures Foul Odor After Cleansing: No Wound Margin: Epibole Slough/Fibrino No Exudate Amount: Medium Exudate Type: Serosanguineous Exudate Color: red, brown Wound Bed Granulation Amount: None Present (0%) Exposed Structure Necrotic Amount: None Present (0%) Fascia Exposed: No Fat Layer (Subcutaneous Tissue) Exposed: Yes Tendon Exposed: No Muscle Exposed: No Joint Exposed: No Bone Exposed: No Periwound Skin  Texture Texture Color No Abnormalities Noted: Yes No Abnormalities Noted: No Hemosiderin Staining: Yes Moisture No Abnormalities Noted: Yes Temperature / Pain Temperature: No Abnormality Treatment Notes Wound #6 (Lower Leg) Wound Laterality: Right, Anterior Cleanser Peri-Wound Care Sween Lotion (Moisturizing lotion) Discharge Instruction: Apply moisturizing lotion as directed Topical Primary Dressing Maxorb Extra Ag+ Alginate Dressing, 2x2 (in/in) Marinaro, Larico ([id number]) [id number] Page 11 of 11 Discharge Instruction: Apply to wound bed as instructed Secondary Dressing ABD Pad, 8x10 Discharge Instruction: Apply over primary dressing as directed. Secured With Compression Wrap ThreePress (3 layer compression wrap) Discharge Instruction: Apply three layer compression as directed. Compression Stockings Add-Ons Electronic Signature(s) Signed: 04/29/2023 12:22:13 PM By: Hunt Oris Signed: 04/29/2023 12:41:20 PM By: Zenaida Deed RN, BSN Entered By: Zenaida Deed on 04/29/2023 11:36:19 -------------------------------------------------------------------------------- Vitals Details Patient Name: Date of Service: [name] 04/29/2023 11:00 A M Medical Record Number: [id number] Patient Account Number: [id number] Date of Birth/Sex: Treating RN: Feb 24, 1951 (72 y.o. M) Primary Care Preciliano Castell: [name] Other Clinician: Referring Ansh Fauble: Treating Hadlee Burback/Extender: [name] in Treatment: 81 Vital Signs Time Taken: 10:58 Temperature (F): 97.9 Height (in): 71 Pulse (bpm): 65 Weight (lbs): 267 Respiratory Rate (breaths/min): 18 Body Mass Index (BMI): 37.2 Blood Pressure (mmHg): 148/88 Reference Range: 80 - 120 mg / dl Electronic Signature(s) Signed: 04/29/2023 11:15:14 AM By: Dayton Scrape Entered By: Dayton Scrape on 04/29/2023 10:58:26

## 2023-05-03 ENCOUNTER — Encounter (HOSPITAL_BASED_OUTPATIENT_CLINIC_OR_DEPARTMENT_OTHER): Payer: Medicare Other | Admitting: General Surgery

## 2023-05-03 DIAGNOSIS — D649 Anemia, unspecified: Secondary | ICD-10-CM | POA: Diagnosis not present

## 2023-05-03 DIAGNOSIS — L97822 Non-pressure chronic ulcer of other part of left lower leg with fat layer exposed: Secondary | ICD-10-CM | POA: Diagnosis not present

## 2023-05-03 DIAGNOSIS — I739 Peripheral vascular disease, unspecified: Secondary | ICD-10-CM | POA: Diagnosis not present

## 2023-05-03 DIAGNOSIS — I110 Hypertensive heart disease with heart failure: Secondary | ICD-10-CM | POA: Diagnosis not present

## 2023-05-03 DIAGNOSIS — L97812 Non-pressure chronic ulcer of other part of right lower leg with fat layer exposed: Secondary | ICD-10-CM | POA: Diagnosis not present

## 2023-05-03 DIAGNOSIS — I509 Heart failure, unspecified: Secondary | ICD-10-CM | POA: Diagnosis not present

## 2023-05-03 NOTE — Progress Notes (Signed)
NKOSI, CORTRIGHT ([id number]) [id number] Page 1 of 1 Visit Report for 05/03/2023 SuperBill Details Patient Name: Date of Service: DEONDRAE, MCGRAIL 05/03/2023 Medical Record Number: [id number] Patient Account Number: [id number] Date of Birth/Sex: Treating RN: 15-Oct-1950 (73 y.o. 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 Lymphedema, not elsewhere classified I87.2 Venous insufficiency (chronic) (peripheral) Z86.718 Personal history of other venous thrombosis and embolism I10 Essential (primary) hypertension I25.10 Atherosclerotic heart disease of native coronary artery without angina pectoris Facility Procedures CPT4 Code Description Modifier Quantity [phone] (Facility Use Only) [phone] - APPLY MULTLAY COMPRS LWR RT LEG 1 ICD-10 Diagnosis Description 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ed Electronic Signature(s) Signed: 05/03/2023 3:15:01 PM By: [name] Signed: 05/03/2023 3:19:52 PM By: Duanne Guess MD FACS Entered By: [name] on 05/03/2023 15:14:42

## 2023-05-03 NOTE — Progress Notes (Signed)
[name] ([id number]) [id number] Page 1 of 6 Visit Report for 05/03/2023 Arrival Information Details Patient Name: Date of Service: [name], [name] 05/03/2023 3:15 PM Medical Record Number: [id number] Patient Account Number: [id number] Date of Birth/Sex: Treating RN: 1950-11-22 (72 y.o. [name] Primary Care [name]: [name] Other Clinician: Referring [name]: Treating [name]/Extender: [name] in Treatment: 48 Visit Information History Since Last Visit Added or deleted any medications: No Patient Arrived: Wheel Chair Any new allergies or adverse reactions: No Arrival Time: 15:12 Had a fall or experienced change in No Accompanied By: self activities of daily living that may affect Transfer Assistance: Manual risk of falls: Patient Identification Verified: Yes Signs or symptoms of abuse/neglect since last visito No Secondary Verification Process Completed: Yes Hospitalized since last visit: No Patient Requires Transmission-Based Precautions: No Implantable device outside of the clinic excluding No Patient Has Alerts: Yes cellular tissue based products placed in the center Patient Alerts: R ABI= N/C, TBI= .75 since last visit: L ABI =N/C, TBI = .57 Has Dressing in Place as Prescribed: Yes Has Compression in Place as Prescribed: Yes Pain Present Now: No Electronic Signature(s) Signed: 05/03/2023 3:15:01 PM By: [name] Entered By: [name] on 05/03/2023 15:12:51 -------------------------------------------------------------------------------- Compression Therapy Details Patient Name: Date of Service: [name] 05/03/2023 3:15 PM Medical Record Number: [id number] Patient Account Number: [id number] Date of Birth/Sex: Treating RN: 23-Dec-1950 (72 y.o. [name] Primary Care [name]: [name] Other Clinician: Referring [name]: Treating [name]/Extender:  [name] in Treatment: 64 Compression Therapy Performed for Wound Assessment: Wound #3 Right,Medial Lower Leg Performed By: Clinician [name] Bruin, RN Compression Type: Three Layer Electronic Signature(s) Signed: 05/03/2023 3:15:01 PM By: [name] Entered By: [name] on 05/03/2023 15:14:01 -------------------------------------------------------------------------------- Encounter Discharge Information Details Patient Name: Date of Service: [name] 05/03/2023 3:15 PM Medical Record Number: [id number] Patient Account Number: [id number] [name], [name] ([id number]) [id number] Page 2 of 6 Date of Birth/Sex: Treating RN: 01/21/1951 (72 y.o. [name] Primary Care [name]: Other Clinician: Nicoletta [name] Referring [name]: Treating [name]/Extender: [name] in Treatment: 77 Encounter Discharge Information Items Discharge Condition: Stable Ambulatory Status: Wheelchair Discharge Destination: Home Transportation: Private Auto Accompanied By: self Schedule Follow-up Appointment: Yes Clinical Summary of Care: Patient Declined Electronic Signature(s) Signed: 05/03/2023 3:15:01 PM By: [name] By: [name] on 05/03/2023 15:14:31 -------------------------------------------------------------------------------- Patient/Caregiver Education Details Patient Name: Date of Service: [name] 10/8/2024andnbsp3:15 PM Medical Record Number: [id number] Patient Account Number: [id number] Date of Birth/Gender: Treating RN: 1951/05/29 (72 y.o. [name] Primary Care Physician: [name] Other Clinician: Referring Physician: Treating Physician/Extender: [name] in Treatment: 42 Education Assessment Education Provided To: Patient Education Topics Provided Wound/Skin Impairment: Methods:  Explain/Verbal Responses: Reinforcements needed, State content correctly Electronic Signature(s) Signed: 05/03/2023 3:15:01 PM By: [name] Entered By: [name] on 05/03/2023 15:14:19 -------------------------------------------------------------------------------- Wound Assessment Details Patient Name: Date of Service: [name], [name] 05/03/2023 3:15 PM Medical Record Number: [id number] Patient Account Number: [id number] Date of Birth/Sex: Treating RN: May 16, 1951 (72 y.o. [name] Primary Care [name]: [name] Other Clinician: Referring [name]: Treating [name]/Extender: [name] in Treatment: 33 Wound Status Wound Number: 10 Primary Lymphedema Etiology: Wound Location: Left, Anterior Lower Leg Wound Open Wounding Event: Blister Status: Date Acquired: 04/26/2023 [name] ([id number]) [phone].pdf Page 3 of 6 Date Acquired: 04/26/2023 Comorbid Cataracts, Anemia, Lymphedema, Congestive Heart Failure, Deep Weeks Of Treatment: 0 History: Vein Thrombosis,  Hypertension, Peripheral Venous Disease Clustered Wound: No Wound Measurements Length: (cm) 5.6 Width: (cm) 4.4 Depth: (cm) 0.1 Area: (cm) 19.352 Volume: (cm) 1.935 % Reduction in Area: 0% % Reduction in Volume: 0% Epithelialization: Small (1-33%) Wound Description Classification: Full Thickness Without Exposed Support Structures Wound Margin: Flat and Intact Exudate Amount: Medium Exudate Type: Serosanguineous Exudate Color: red, brown Foul Odor After Cleansing: No Slough/Fibrino Yes Wound Bed Granulation Amount: Large (67-100%) Exposed Structure Granulation Quality: Red Fat Layer (Subcutaneous Tissue) Exposed: Yes Necrotic Amount: None Present (0%) Periwound Skin Texture Texture Color No Abnormalities Noted: Yes No Abnormalities Noted: Yes Moisture Temperature / Pain No Abnormalities Noted: No Temperature: No  Abnormality Dry / Scaly: No Maceration: No Treatment Notes Wound #10 (Lower Leg) Wound Laterality: Left, Anterior Cleanser Peri-Wound Care Sween Lotion (Moisturizing lotion) Discharge Instruction: Apply moisturizing lotion as directed Topical Primary Dressing Maxorb Extra Ag+ Alginate Dressing, 2x2 (in/in) Discharge Instruction: Apply to wound bed as instructed Secondary Dressing ABD Pad, 8x10 Discharge Instruction: Apply over primary dressing as directed. Secured With Compression Wrap ThreePress (3 layer compression wrap) Discharge Instruction: Apply three layer compression as directed. Compression Stockings Add-Ons Electronic Signature(s) Signed: 05/03/2023 3:15:01 PM By: [name] Entered By: [name] on 05/03/2023 15:13:22 Wound Assessment Details -------------------------------------------------------------------------------- [name] ([id number]) [id number] Page 4 of 6 Patient Name: Date of Service: [name], [name] 05/03/2023 3:15 PM Medical Record Number: [id number] Patient Account Number: [id number] Date of Birth/Sex: Treating RN: 18-May-1951 (72 y.o. [name] Primary Care Morrill Bomkamp: [name] Other Clinician: Referring Tehya Leath: Treating Jaiden Wahab/Extender: [name] in Treatment: 64 Wound Status Wound Number: 3 Primary Lymphedema Etiology: Wound Location: Right, Medial Lower Leg Wound Open Wounding Event: Gradually Appeared Status: Date Acquired: 12/07/2021 Comorbid Cataracts, Anemia, Lymphedema, Congestive Heart Failure, Deep Weeks Of Treatment: 64 History: Vein Thrombosis, Hypertension, Peripheral Venous Disease Clustered Wound: No Wound Measurements Length: (cm) 7.5 % Reduction in Area: -484.5% Width: (cm) 5.3 % Reduction in Volume: -630.8% Depth: (cm) 0.5 Epithelialization: Medium (34-66%) Area: (cm) 31.22 Volume: (cm) 15.61 Wound Description Classification:  Full Thickness With Exposed Support Structures Foul Odor After Cleansing: No Wound Margin: Flat and Intact Slough/Fibrino Yes Exudate Amount: Medium Exudate Type: Serosanguineous Exudate Color: red, brown Wound Bed Granulation Amount: Small (1-33%) Exposed Structure Granulation Quality: Red Fascia Exposed: No Necrotic Amount: Large (67-100%) Fat Layer (Subcutaneous Tissue) Exposed: Yes Necrotic Quality: Adherent Slough Tendon Exposed: No Muscle Exposed: No Joint Exposed: No Bone Exposed: No Periwound Skin Texture Texture Color No Abnormalities Noted: Yes No Abnormalities Noted: No Erythema: No Moisture Rubor: Yes No Abnormalities Noted: Yes Temperature / Pain Temperature: No Abnormality Tenderness on Palpation: Yes Treatment Notes Wound #3 (Lower Leg) Wound Laterality: Right, Medial Cleanser Peri-Wound Care Skin Prep Discharge Instruction: cavelon to macerated wound margins Sween Lotion (Moisturizing lotion) Discharge Instruction: Apply moisturizing lotion as directed Topical Gentamicin Discharge Instruction: to open areas Mupirocin Ointment Discharge Instruction: Apply Mupirocin (Bactroban) as instructed Primary Dressing Maxorb Extra Ag+ Alginate Dressing, 4x4.75 (in/in) Discharge Instruction: Apply to excoriated weeping areas Secondary Dressing Zetuvit Plus 4x8 in Discharge Instruction: Apply over primary dressing as directed. DEDDRICK, SAINDON ([id number]) [id number] Page 5 of 6 Secured With Compression Wrap ThreePress (3 layer compression wrap) Discharge Instruction: Apply three layer compression as directed. Compression Stockings Add-Ons Electronic Signature(s) Signed: 05/03/2023 3:15:01 PM By: [name] Entered By: [name] on 05/03/2023 15:13:38 -------------------------------------------------------------------------------- Wound Assessment Details Patient Name: Date of Service: [name], [name] 05/03/2023 3:15  PM Medical Record Number: [id number] Patient Account Number: [id number]  Date of Birth/Sex: Treating RN: 09-20-1950 (72 y.o. [name] Primary Care Anajulia Leyendecker: [name] Other Clinician: Referring [name]: Treating Leen Tworek/Extender: [name] in Treatment: 64 Wound Status Wound Number: 6 Primary Calcinosis Etiology: Wound Location: Right, Anterior Lower Leg Wound Open Wounding Event: Gradually Appeared Status: Date Acquired: 05/12/2022 Comorbid Cataracts, Anemia, Lymphedema, Congestive Heart Failure, Deep Weeks Of Treatment: 50 History: Vein Thrombosis, Hypertension, Peripheral Venous Disease Clustered Wound: Yes Wound Measurements Length: (cm) Width: (cm) Depth: (cm) Clustered Quantity: Area: (cm) Volume: (cm) 1.4 % Reduction in Area: 6.8% 0.2 % Reduction in Volume: -175% 0.3 Epithelialization: Large (67-100%) 2 0.22 0.066 Wound Description Classification: Full Thickness Without Exposed Sup Wound Margin: Epibole Exudate Amount: Medium Exudate Type: Serosanguineous Exudate Color: red, brown port Structures Foul Odor After Cleansing: No Slough/Fibrino No Wound Bed Granulation Amount: None Present (0%) Exposed Structure Necrotic Amount: None Present (0%) Fascia Exposed: No Fat Layer (Subcutaneous Tissue) Exposed: Yes Tendon Exposed: No Muscle Exposed: No Joint Exposed: No Bone Exposed: No Periwound Skin Texture Texture Color No Abnormalities Noted: Yes No Abnormalities Noted: No Hemosiderin Staining: Yes Moisture No Abnormalities Noted: Yes Temperature / Pain Temperature: No Abnormality Treatment Notes Wound #6 (Lower Leg) Wound Laterality: Right, Anterior [name], [name] ([id number]) [id number] Page 6 of 6 Cleanser Peri-Wound Care Sween Lotion (Moisturizing lotion) Discharge Instruction: Apply moisturizing lotion as directed Topical Primary Dressing Maxorb Extra Ag+ Alginate Dressing,  2x2 (in/in) Discharge Instruction: Apply to wound bed as instructed Secondary Dressing ABD Pad, 8x10 Discharge Instruction: Apply over primary dressing as directed. Secured With Compression Wrap ThreePress (3 layer compression wrap) Discharge Instruction: Apply three layer compression as directed. Compression Stockings Add-Ons Electronic Signature(s) Signed: 05/03/2023 3:15:01 PM By: [name] Entered By: [name] on 05/03/2023 15:13:48

## 2023-05-04 NOTE — Progress Notes (Unsigned)
Cardiology Office Note   Date:  05/04/2023   ID:  [name], DOB [birth year], MRN [id number]  PCP:  Jeoffrey Massed, MD  Cardiologist:   Dietrich Pates, MD    F?U of CAD     History of Present Illness: [name] is a 72 y.o. adult with a history of CAD   Last cardiac catheterization in Novembere 2015 showed:  a widely patent previously placed 4.018 mm bare-metal stent in the proximal LAD without restenosis in an otherwise normal LAD and circumflex system.  Dominant RCA with  99% stenosis proximal to the mid vessel .Normal LV function without focal wall motion abnormalities LVEF 60%. The patinet underwent PCI to the subtotal occlusion of the RCA with 99% stenosis being reduced to 0% with ultimate insertion of a Xience Alpine 4.018 mm DES stent post dilated to 4.49 mm. The pt also has a hx of HTN, HL and myositis     I saw the pt in July 2021   He was seen by Margaretha Glassing in July 2023    The pt says he gets SOB with activity    He denies CP     No palpitations.  No dizziness. 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   Larey Seat about 6 or 8 months ago    Hx of DVT while in [place]   On anticoagulant   He has been seen by hematology here   REocmm long  term anticoagulation as it was unproved.    I saw the pt in clinic in Sept 2023     No outpatient medications have been marked as taking for the 05/05/23 encounter (Appointment) with Pricilla Riffle, MD.     Allergies:   Patient has no known allergies.   Past Medical History:  Diagnosis Date   Adenomatous colon polyp - diminutive 06/2019   no recall needed   Bilateral lower extremity edema    Coronary artery disease    a. s/p BMS to LAD (1998) b. s/p DES to RCA (06/24/14)   DVT (deep venous thrombosis) (HCC)    12/2020 L femoral and poplitea.  Unprovoked.  hypercoag w/u NEG.   Eliquis.   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 ?inclusion body myositis? Power WC as of 2022   Macrocytic anemia    Estab with cone hem/onc 08/2022   Osteoarthritis, multiple sites    Palatal fistula    Palpable abd. aorta    Aortic u/s NO ANEURISM 2020   RBBB    Spinal stenosis    Dr Louanna Raw  surgery   Thrombocytopenia [facility])     Past Surgical History:  Procedure Laterality Date   APPENDECTOMY  2010   CERVICAL FUSION  09/2009   CHOLECYSTECTOMY  1998   COLONOSCOPY     2020-->no recall   EYE SURGERY     Lasik   HYDROCELE EXCISION  2005   LEFT HEART CATHETERIZATION WITH CORONARY ANGIOGRAM N/A 06/24/2014   Procedure: LEFT HEART CATHETERIZATION WITH CORONARY ANGIOGRAM;  Surgeon: Lennette Bihari, MD;  Location: [facility] C[facility] LAB;  Service: Cardiovascular;  Laterality: N/A;   LUMBAR SPINE SURGERY  11/2011  MUSCLE BIOPSY Left 07/11/2018   Procedure: Left Vastus lateralus muscle biopsy;  Surgeon: Jadene Pierini, MD;  Location: [facility] OR;  Service: Neurosurgery;  Laterality: Left;  Left Vastus lateralus muscle biopsy   PERCUTANEOUS CORONARY STENT INTERVENTION (PCI-S)  06/24/2014   DES to RCA. Procedure: PERCUTANEOUS CORONARY STENT INTERVENTION (PCI-S);  Surgeon: Lennette Bihari, MD;  Location: [facility] CATH LAB;  Service: Cardiovascular;;  Distal RCA   TONSILLECTOMY AND ADENOIDECTOMY  2012   TOTAL HIP ARTHROPLASTY Right 2021   RIGHT   TRANSTHORACIC ECHOCARDIOGRAM     03/2019 normal     Social History:  The patient  reports that he quit smoking about 33 years ago. His smoking use included cigarettes. He started smoking about 56 years ago. He has a 17.3 pack-year smoking history. He has never used smokeless tobacco. He reports current alcohol use. He reports that he does not use drugs.   Family History:  The patient's family history includes Aneurysm in his father; Cystic fibrosis in his brother;  Esophageal cancer in his mother; Heart attack in his mother; Heart disease in his father.    ROS:  Please see the history of present illness. All other systems are reviewed and  Negative to the above problem except as noted.    PHYSICAL EXAM: VS:  There were no vitals taken for this visit.   GEN: Morbidly obese 72 yo in NAD   HEENT: normal  Neck: JVP is not elevated  Cardiac: RRR; no murmurs,  1+ edema   Chronic stasis changes   Legs wrapped Respiratory:  clear to auscultation bilaterally, HY:QMVH  Distended  Nontender  No definite masses  MS: Moving all extremities   Neuro   Deferred  Psych: euthymic mood, full affect   EKG:  EKG is not ordered today.        Lipid Panel    Component Value Date/Time   CHOL 116 12/31/2020 0856   TRIG 92 12/31/2020 0856   HDL 28 (L) 12/31/2020 0856   CHOLHDL 4.1 12/31/2020 0856   CHOLHDL 3.3 11/13/2015 1107   VLDL 26 11/13/2015 1107   LDLCALC 70 12/31/2020 0856   LDLDIRECT 65 05/16/2019 1400      Wt Readings from Last 3 Encounters:  03/04/23 289 lb 11.2 oz (131.4 kg)  12/22/22 284 lb (128.8 kg)  12/09/22 284 lb 9.6 oz (129.1 kg)      ASSESSMENT AND PLAN:  1  CAD  Patient denies CP   I am not  convinced of active symptoms   Will heck BMET and BNP     2  HL Will check lipomed  He has problems with statins  Curr on Zetia and Crestor 5       3  HTN  BP is mildly increased   Will check BMET   Consider increase lisinopril    4 Morbid obesity   Difficult wt loss given activity limitiations   5  Myopathy   Needs  to get follow up in [place]     F/U in clinic in 6 months     Current medicines are reviewed at length with the patient today.  The patient does not have concerns regarding medicines.  Signed, Dietrich Pates, MD  05/04/2023 8:11 PM    [facility] [address] [place], [place], [place]  [address] Phone: [phone]; Fax: [phone]

## 2023-05-05 ENCOUNTER — Other Ambulatory Visit: Payer: Self-pay | Admitting: Internal Medicine

## 2023-05-05 ENCOUNTER — Ambulatory Visit: Payer: Medicare Other | Attending: Internal Medicine | Admitting: Internal Medicine

## 2023-05-05 ENCOUNTER — Encounter: Payer: Self-pay | Admitting: Internal Medicine

## 2023-05-05 ENCOUNTER — Ambulatory Visit (INDEPENDENT_AMBULATORY_CARE_PROVIDER_SITE_OTHER): Payer: Medicare Other

## 2023-05-05 VITALS — BP 120/62 | HR 80 | Ht 69.5 in | Wt 285.0 lb

## 2023-05-05 DIAGNOSIS — Z136 Encounter for screening for cardiovascular disorders: Secondary | ICD-10-CM

## 2023-05-05 DIAGNOSIS — Z09 Encounter for follow-up examination after completed treatment for conditions other than malignant neoplasm: Secondary | ICD-10-CM | POA: Diagnosis not present

## 2023-05-05 DIAGNOSIS — R0602 Shortness of breath: Secondary | ICD-10-CM

## 2023-05-05 DIAGNOSIS — I10 Essential (primary) hypertension: Secondary | ICD-10-CM

## 2023-05-05 DIAGNOSIS — E785 Hyperlipidemia, unspecified: Secondary | ICD-10-CM

## 2023-05-05 DIAGNOSIS — Z0000 Encounter for general adult medical examination without abnormal findings: Secondary | ICD-10-CM

## 2023-05-05 DIAGNOSIS — I2510 Atherosclerotic heart disease of native coronary artery without angina pectoris: Secondary | ICD-10-CM

## 2023-05-05 DIAGNOSIS — Z79899 Other long term (current) drug therapy: Secondary | ICD-10-CM

## 2023-05-05 DIAGNOSIS — R002 Palpitations: Secondary | ICD-10-CM

## 2023-05-05 DIAGNOSIS — E782 Mixed hyperlipidemia: Secondary | ICD-10-CM

## 2023-05-05 NOTE — Progress Notes (Unsigned)
ZIO XT serial # [id number] from office inventory applied to patient.

## 2023-05-05 NOTE — Patient Instructions (Addendum)
Medication Instructions:   *If you need a refill on your cardiac medications before your next appointment, please call your pharmacy*   Lab Work: CBC, CK, BMET, TSH, NMR, HGBA1C, PRO BNP  If you have labs (blood work) drawn today and your tests are completely normal, you will receive your results only by: MyChart Message (if you have MyChart) OR A paper copy in the mail If you have any lab test that is abnormal or we need to change your treatment, we will call you to review the results.   Your physician has requested that you have an echocardiogram. Echocardiography is a painless test that uses sound waves to create images of your heart. It provides your doctor with information about the size and shape of your heart and how well your heart's chambers and valves are working. This procedure takes approximately one hour. There are no restrictions for this procedure. Please do NOT wear cologne, perfume, aftershave, or lotions (deodorant is allowed). Please arrive 15 minutes prior to your appointment time.   ZIO XT- Long Term Monitor Instructions  Your physician has requested you wear a ZIO patch monitor for 7 days.  This is a single patch monitor. [employer] supplies one patch monitor per enrollment. Additional stickers are not available. Please do not apply patch if you will be having a Nuclear Stress Test,  Echocardiogram, Cardiac CT, MRI, or Chest Xray during the period you would be wearing the  monitor. The patch cannot be worn during these tests. You cannot remove and re-apply the  ZIO XT patch monitor.  Your ZIO patch monitor will be mailed 3 day USPS to your address on file. It may take 3-5 days  to receive your monitor after you have been enrolled.  Once you have received your monitor, please review the enclosed instructions. Your monitor  has already been registered assigning a specific monitor serial # to you.  Billing and Patient Assistance Program Information  We have supplied  [employer] with any of your insurance information on file for billing purposes. [employer] offers a sliding scale Patient Assistance Program for patients that do not have  insurance, or whose insurance does not completely cover the cost of the ZIO monitor.  You must apply for the Patient Assistance Program to qualify for this discounted rate.  To apply, please call [employer] at [phone], select option 4, select option 2, ask to apply for  Patient Assistance Program. Meredeth Ide will ask your household income, and how many people  are in your household. They will quote your out-of-pocket cost based on that information.  [employer] will also be able to set up a 28-month, interest-free payment plan if needed.  Applying the monitor   Shave hair from upper left chest.  Hold abrader disc by orange tab. Rub abrader in 40 strokes over the upper left chest as  indicated in your monitor instructions.  Clean area with 4 enclosed alcohol pads. Let dry.  Apply patch as indicated in monitor instructions. Patch will be placed under collarbone on left  side of chest with arrow pointing upward.  Rub patch adhesive wings for 2 minutes. Remove white label marked "1". Remove the white  label marked "2". Rub patch adhesive wings for 2 additional minutes.  While looking in a mirror, press and release button in center of patch. A small green light will  flash 3-4 times. This will be your only indicator that the monitor has been turned on.  Do not shower for the first 24 hours.  You may shower after the first 24 hours.  Press the button if you feel a symptom. You will hear a small click. Record Date, Time and  Symptom in the Patient Logbook.  When you are ready to remove the patch, follow instructions on the last 2 pages of Patient  Logbook. Stick patch monitor onto the last page of Patient Logbook.  Place Patient Logbook in the blue and white box. Use locking tab on box and tape box closed  securely. The blue and white box  has prepaid postage on it. Please place it in the mailbox as  soon as possible. Your physician should have your test results approximately 7 days after the  monitor has been mailed back to [facility].  Call [facility] Customer Care at [phone] if you have questions regarding  your ZIO XT patch monitor. Call them immediately if you see an orange light blinking on your  monitor.  If your monitor falls off in less than 4 days, contact our Monitor department at [phone].  If your monitor becomes loose or falls off after 4 days call [employer] at [phone] for  suggestions on securing your monitor     Follow-Up: At [facility], you and your health needs are our priority.  As part of our continuing mission to provide you with exceptional heart care, we have created designated Provider Care Teams.  These Care Teams include your primary Cardiologist (physician) and Advanced Practice Providers (APPs -  Physician Assistants and Nurse Practitioners) who all work together to provide you with the care you need, when you need it.  We recommend signing up for the patient portal called "MyChart".  Sign up information is provided on this After Visit Summary.  MyChart is used to connect with patients for Virtual Visits (Telemedicine).  Patients are able to view lab/test results, encounter notes, upcoming appointments, etc.  Non-urgent messages can be sent to your provider as well.   To learn more about what you can do with MyChart, go to [email or web address].    Your next appointment:  JUNE 2025

## 2023-05-06 ENCOUNTER — Encounter (HOSPITAL_BASED_OUTPATIENT_CLINIC_OR_DEPARTMENT_OTHER): Payer: Medicare Other | Admitting: General Surgery

## 2023-05-06 DIAGNOSIS — I110 Hypertensive heart disease with heart failure: Secondary | ICD-10-CM | POA: Diagnosis not present

## 2023-05-06 DIAGNOSIS — L97812 Non-pressure chronic ulcer of other part of right lower leg with fat layer exposed: Secondary | ICD-10-CM | POA: Diagnosis not present

## 2023-05-06 DIAGNOSIS — I739 Peripheral vascular disease, unspecified: Secondary | ICD-10-CM | POA: Diagnosis not present

## 2023-05-06 DIAGNOSIS — L97822 Non-pressure chronic ulcer of other part of left lower leg with fat layer exposed: Secondary | ICD-10-CM | POA: Diagnosis not present

## 2023-05-06 DIAGNOSIS — I890 Lymphedema, not elsewhere classified: Secondary | ICD-10-CM | POA: Diagnosis not present

## 2023-05-06 DIAGNOSIS — D649 Anemia, unspecified: Secondary | ICD-10-CM | POA: Diagnosis not present

## 2023-05-06 DIAGNOSIS — I509 Heart failure, unspecified: Secondary | ICD-10-CM | POA: Diagnosis not present

## 2023-05-06 LAB — CBC
Hematocrit: 37.7 % (ref 37.5–51.0)
Hemoglobin: 12.8 g/dL — ABNORMAL LOW (ref 13.0–17.7)
MCH: 34.5 pg — ABNORMAL HIGH (ref 26.6–33.0)
MCHC: 34 g/dL (ref 31.5–35.7)
MCV: 102 fL — ABNORMAL HIGH (ref 79–97)
Platelets: 92 10*3/uL — CL (ref 150–450)
RBC: 3.71 x10E6/uL — ABNORMAL LOW (ref 4.14–5.80)
RDW: 12.4 % (ref 11.6–15.4)
WBC: 5 10*3/uL (ref 3.4–10.8)

## 2023-05-06 LAB — BASIC METABOLIC PANEL
BUN/Creatinine Ratio: 19 (ref 10–24)
BUN: 17 mg/dL (ref 8–27)
CO2: 25 mmol/L (ref 20–29)
Calcium: 9.1 mg/dL (ref 8.6–10.2)
Chloride: 104 mmol/L (ref 96–106)
Creatinine, Ser: 0.88 mg/dL (ref 0.76–1.27)
Glucose: 103 mg/dL — ABNORMAL HIGH (ref 70–99)
Potassium: 4.6 mmol/L (ref 3.5–5.2)
Sodium: 142 mmol/L (ref 134–144)
eGFR: 90 mL/min/{1.73_m2} (ref >59)

## 2023-05-06 LAB — NMR, LIPOPROFILE
Cholesterol, Total: 106 mg/dL (ref 100–199)
HDL Particle Number: 29.7 umol/L — ABNORMAL LOW (ref >=30.5)
HDL-C: 51 mg/dL (ref >39)
LDL Particle Number: 350 nmol/L (ref <1000)
LDL Size: 20 nmol — ABNORMAL LOW (ref >20.5)
LDL-C (NIH Calc): 38 mg/dL (ref 0–99)
LP-IR Score: <25 (ref <=45)
Small LDL Particle Number: 149 nmol/L (ref <=527)
Triglycerides: 85 mg/dL (ref 0–149)

## 2023-05-06 LAB — TSH: TSH: 1.6 u[IU]/mL (ref 0.450–4.500)

## 2023-05-06 LAB — PRO B NATRIURETIC PEPTIDE: NT-Pro BNP: 1085 pg/mL — ABNORMAL HIGH (ref 0–376)

## 2023-05-06 LAB — CK: Total CK: 182 U/L (ref 41–331)

## 2023-05-06 LAB — HEMOGLOBIN A1C
Est. average glucose Bld gHb Est-mCnc: 105 mg/dL
Hgb A1c MFr Bld: 5.3 % (ref 4.8–5.6)

## 2023-05-06 NOTE — Progress Notes (Signed)
[name] ([id number]) [id number] Page 1 of 16 Visit Report for 05/06/2023 Chief Complaint Document Details Patient Name: Date of Service: [name], [name] 05/06/2023 11:00 A M Medical Record Number: [id number] Patient Account Number: [id number] Date of Birth/Sex: Treating RN: 13-Apr-1951 (72 y.o. M) Primary Care Provider: [name] Other Clinician: Referring Provider: Treating Provider/Extender: [name] in Treatment: 64 Information Obtained from: Patient Chief Complaint Patient seen for complaints of Non-Healing Wounds. Electronic Signature(s) Signed: 05/06/2023 11:47:20 AM By: Duanne Guess MD FACS Entered By: Duanne Guess on 05/06/2023 08:47:20 -------------------------------------------------------------------------------- Debridement Details Patient Name: Date of Service: [name] 05/06/2023 11:00 A M Medical Record Number: [id number] Patient Account Number: [id number] Date of Birth/Sex: Treating RN: 1951-04-16 (72 y.o. [name] Primary Care Provider: [name] Other Clinician: Referring Provider: Treating Provider/Extender: [name] in Treatment: 64 Debridement Performed for Assessment: Wound #3 Right,Medial Lower Leg Performed By: Physician Duanne Guess, MD The following information was scribed by: Zenaida Deed The information was scribed for: Duanne Guess Debridement Type: Debridement Level of Consciousness (Pre-procedure): Awake and Alert Pre-procedure Verification/Time Out Yes - 11:20 Taken: Start Time: 11:21 Pain Control: Lidocaine 4% T opical Solution Percent of Wound Bed Debrided: 40% T Area Debrided (cm): otal 13.6 Tissue and other material debrided: Non-Viable, Slough, Slough Level: Non-Viable Tissue Debridement Description: Selective/Open Wound Instrument: Curette Bleeding: Minimum Hemostasis Achieved: Pressure Procedural  Pain: 3 Post Procedural Pain: 2 Response to Treatment: Procedure was tolerated well Level of Consciousness (Post- Awake and Alert procedure): Post Debridement Measurements of Total Wound Length: (cm) 7.6 Width: (cm) 5.7 Depth: (cm) 0.4 Volume: (cm) 13.609 Character of Wound/Ulcer Post Debridement: Improved [name], [name] ([id number]) [id number] Page 2 of 16 Post Procedure Diagnosis Same as Pre-procedure Electronic Signature(s) Signed: 05/06/2023 11:51:00 AM By: Zenaida Deed RN, BSN Signed: 05/06/2023 12:03:38 PM By: Duanne Guess MD FACS Entered By: Zenaida Deed on 05/06/2023 08:24:03 -------------------------------------------------------------------------------- HPI Details Patient Name: Date of Service: [name], [name] 05/06/2023 11:00 A M Medical Record Number: [id number] Patient Account Number: [id number] Date of Birth/Sex: Treating RN: 1951-03-08 (72 y.o. M) Primary Care Provider: [name] Other Clinician: Referring Provider: Treating Provider/Extender: [name] in Treatment: 42 History of Present Illness HPI Description: ADMISSION 02/09/2022 This is a 72 year old man who recently moved to [place] from [place]. He has a history of of coronary artery disease and prior left popliteal vein DVT . He is not diabetic and does not smoke. 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 He has had multiple spinal surgeries and has limited mobility. He primarily uses a motorized wheelchair. He has had lymphedema for many years and does have lymphedema pumps although he says he does not use them frequently. He has wounds on his bilateral lower extremities. He was receiving care in [place] for these prior to his move. They were apparently packing his wounds with Betadine soaked gauze. He has worn compression wraps in the past but not recently. He did say that they helped tremendously. In clinic today,  his right ABI is noncompressible but has good signals; the left ABI was 1.17. No formal vascular studies have been performed. On his left dorsal foot, there is a circular lesion that exposes the fat layer. There is fairly heavy slough accumulation within the wound, but no significant odor. On his right medial calf, he has multiple pinholes that have slough buildup in them and probe for about 2 to 4 mm in depth. They are draining  clear fluid. On his right lateral midfoot, he has ulceration consistent with venous stasis. It is geographic and has slough accumulation. 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 02/17/2022: All of the wounds are little bit larger today and have more slough accumulation. Today, the medial calf openings are larger and probe to something that feels calcified. There is odor coming from his wounds. His vascular studies are scheduled for August 9 and 10. 02-24-2022 upon evaluation today patient presents for follow-up concerning his ongoing issues here with his lower extremities. With that being said he does have significant problems with what appears to be cellulitis unfortunately the PCR culture that was sent last week got crushed by [employer] in transit. With that being said we are going to reobtain a culture today although I am actually to do it on the right medial leg as this seems to be the most inflamed area today where there is some questionable drainage as well. I am going to remove some of the calcium deposits which are loosening obtain a deeper culture from this area. Fortunately I do not see any evidence of active infection systemically which is good news but locally I think we are having a bigger issue here. He does have his appointment next Thursday with vascular. Patient has also been referred to [facility] for further evaluation and treatment of the calcinosis for possible sulfate  injections. 03/04/2022: The culture that was performed last week grew out multiple species including Klebsiella, Morganella, and Pseudomonas aeruginosa. He had been prescribed Bactrim but this was not adequate to cover all of the species and levofloxacin was recommended. He completed the Bactrim but did not pick up the levofloxacin and begin taking it until yesterday. 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 He had arterial studies performed today. He does have evidence of peripheral arterial disease. Results copied here: +-------+-----------+-----------+------------+------------+ ABI/TBIT oday's ABIT oday's TBIPrevious ABIPrevious TBI +-------+-----------+-----------+------------+------------+ Right [place] 0.75    +-------+-----------+-----------+------------+------------+ Left  0.57    +-------+-----------+-----------+------------+------------+ Arterial wall calcification precludes accurate ankle pressures and ABIs. Summary: Right: Resting right ankle-brachial index indicates noncompressible right lower extremity arteries. The right toe-brachial index is normal. Left: Resting left ankle-brachial index indicates noncompressible left lower extremity arteries. The left toe-brachial index is abnormal. He continues to have further tissue breakdown at the right medial calf and there is ample slough accumulation along with necrotic subcutaneous tissue. The left dorsal foot wound has built up slough, as well. The right medial foot wound is nearly closed with just a light layer of eschar over the surface. The right dorsal lateral foot wound has also accumulated a fair amount of slough and remains quite tender. [name], [name] ([id number]) [id number] Page 3 of 16 03/10/2022: He has been taking the  prescribed levofloxacin and has about 3 days left of this. The erythema and induration on his right leg has improved. He continues to experience further breakdown of the right medial calf wound. There is extensive slough and debris accumulation there. There is heavy slough on his left dorsal foot wound, but no odor or purulent drainage. The right medial foot wound appears to be closed. The right dorsal lateral foot wound also has a fair amount of slough but it is less tender than at our last visit. He still does not have  an appointment with dermatology. 03/22/2022: The right anterior tibial wound has closed. The right lateral foot wound is nearly closed with just a little bit of slough. The left dorsal foot wound is smaller and continues to have some slough buildup but less so than at prior visits. There is good granulation tissue underlying the slough. Unfortunately the right medial calf wound continues to deteriorate. It has a foul odor today and a lot of necrotic tissue, the surrounding skin is also erythematous and moist. 03/30/2022: The right lateral foot wound continues to contract and just has a little bit of slough and eschar present. The left dorsal foot wound is also smaller with slough overlying good granulation tissue. The right medial calf wound actually looks quite a bit better today; there is no odor and there is much less necrotic tissue although some remains present. We have been using topical gentamicin and he has been taking oral Augmentin. 04/07/2022: The patient saw dermatology at [facility] last week. Unfortunately, it appears that they did not read any of the documentation that was provided. 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 Overall, it was a highly unsatisfactory consultation. In the meantime, however, we were able to identify compounding pharmacy who could create a topical sodium thiosulfate ointment. The patient has that with him today. Both of the right foot wounds are now closed. The left dorsal foot wound has contracted but still has a layer of slough on the surface. The medial calf wounds on the right all look a little bit better. They still have heavy slough along with the chunks of calcium. Everything is stained purple. 04/14/2022: The wound on his dorsal left foot is a little bit smaller again today. There is still slough accumulation on the surface. The medial calf wounds have quite a bit less slough accumulation today. His right leg, however, is warm and erythematous, concerning for cellulitis. 04/14/2022: He completed his course of Augmentin yesterday. The medial calf wound sites are much cleaner today and shallower. There is less fragmented calcium in the wounds. He has a lot of lymphedema fluid-like drainage from these wounds, but no purulent discharge or malodor. The wound on his dorsal left foot has also contracted and is shallower. There is a layer of slough over healthy granulation tissue. 05/05/2022: The left dorsal foot wound is smaller today with less slough and more granulation tissue. The right medial calf wounds are shallower, but have extensive slough and some fat necrosis. The drainage has diminished considerably, however. 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 05/12/2022: The left dorsal foot wound continues to contract and fill with granulation tissue. There is slough on the wound surface. The right medial calf wounds also continue to fill with healthy tissue, but there is remaining slough and fat necrosis present. He had a significant increase in his  drainage this week and it was a bright yellow-green color. He also had a new wound open over his right anterior tibial surface associated with the spike of cutaneous calcium. The leg is more red, as well, concerning for infection. 05/19/2022: His culture returned positive for Pseudomonas. He is currently taking a  course of oral levofloxacin. We have also been using topical gentamicin mixed in with his sodium thiosulfate. All of the wounds look better this week. 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 There is slough and nonviable subcutaneous tissue still present. The new anterior tibial wound just has a light layer of slough. The dorsal foot wound also has some slough present and appears a little dry today. 05/26/2022: The right medial leg wound continues to improve. It is feeling with granulation tissue, but still accumulates a fairly thick layer of slough on the surface. The more recent anterior tibial wound has remained quite small, but also has some slough on the surface. The left dorsal foot wound has contracted somewhat and has perimeter epithelialization. He completed his oral levofloxacin. 06/02/2022: The left dorsal foot wound continues to improve significantly. There is just a little slough on the wound surface. The right medial leg wound had black drainage, but it looks like silver alginate was applied last week and I theorized that silver sulfide was created with a combination of the silver alginate and the sodium thiosulfate. It did, however, have a bit of an odor to it and extensive slough accumulation. The small anterior tibial wound also had a bit of slough and nonviable subcutaneous tissue present. The skin of his leg was rather macerated, as well. 06/09/2022: The left dorsal foot wound is smaller again this week with just a light layer of slough on the surface. The right medial leg wound looks substantially better. The leg and periwound skin are no  longer red and macerated. There is good granulation tissue forming with less slough and nonviable tissue. The anterior tibial wound just has a small amount of slough accumulation. 06/16/2022: The left dorsal foot wound continues to contract. His wrap slipped a little bit, however, and his foot is more swollen. The right medial leg wound continues to improve. The underlying tissue is more vital-appearing and there is less slough. He does have substantial drainage. The small anterior tibial wound has a bit of slough accumulation. 06/23/2022: The left dorsal foot wound is about half the size as it was last week. There is just some light slough on the surface and some eschar buildup around the edges. The right anterior leg wound is small with a little slough on the surface. Unfortunately, the right medial leg wound deteriorated fairly substantially. It is malodorous with gray/green/black discoloration. 06/30/2022: The left dorsal foot wound continues to contract. It is nearly closed with just a light layer of slough present. The right anterior leg wound is about the same size and has a small amount of slough on the surface. The right medial leg wound looks a lot better than it did last week. The odor has abated and the tissue is less pulpy and necrotic-looking. There is still a fairly thick layer of slough present. 12/13; the left dorsal foot wound looks very healthy. There was no need to change the dressing here and no debridement. He has a small area on the right anterior lower leg apparently had not changed much in size. The area on the medial leg is the most problematic area this is large with some depth and a completely nonviable surface today. 07/14/2022: The left dorsal foot wound is smaller today with just a little slough and eschar present. The right anterior lower leg wound is about the same without any significant change. The medial leg wound has a black and green surface and is malodorous. No  purulent drainage and the periwound skin is intact. Edema control is suboptimal. 07/21/2022: The dorsal foot wound continues to contract and is nearly closed with just a little bit of slough and eschar on the surface. The right anterior lower leg wound is shallower today but otherwise the dimensions are unchanged. The medial leg wound looks significantly better although it still has a nonviable surface; the odor and black/green surface has resolved. His culture came back with multiple species sensitive to Augmentin, which was prescribed and a very low level of Pseudomonas, which should be handled by the topical gentamicin we have been using. 07/28/2022: The dorsal foot wound is down to just a very tiny opening with a little eschar on the surface. The right anterior tibial wound is about the same with some slough buildup. The right medial leg wound looks quite a bit better this week. There is thick rubbery slough on the surface, but the odor and drainage has improved significantly. 08/04/2022: The dorsal foot wound is almost healed, but the periwound skin has broken down. This appears to be secondary to moisture and adhesive from the absorbent pad. The right anterior tibial wound is shallower. The right medial leg wound continues to improve. It is smaller, still with rubbery slough on the surface. No malodor or purulent drainage. 08/11/2022: The dorsal foot wound is about the same size. It is a little bit dry with some slough accumulation. The right anterior tibial wound is also unchanged. The right medial leg wound is filling in further with good granulation tissue. Still with some slough buildup on the surface. [name], [name] ([id number]) [id number] Page 4 of 16 08/18/2022: His left leg is bright red, hot, and swollen with cellulitis. The dorsal foot wound actually looks quite good and is superficial with just a little bit of slough accumulation. The right anterior tibial wound  is unchanged. The right medial leg wound has filled in with good granulation tissue but has copious amounts of drainage. 08/25/2022: His cellulitis has responded nicely to the oral antibiotics. His dorsal foot wound is smaller but has a fair amount of slough buildup. The right anterior tibial wound remains stable and unchanged. The right medial leg wound has more granulation tissue under a layer of slough, but continues to have copious drainage. The drainage is actually causing skin irritation and threatens to result in breakdown. 09/01/2022: He continues to have profuse drainage from his wounds which is resulting in tissue maceration on both the dorsum of his left foot and the periwound distal to his medial leg wound. The dorsal foot wound is nearly healed, despite this. The medial right leg wound is filling in with good granulation tissue. We are working on getting home health assistance to change his dressings more frequently to try and control the drainage better. 09/08/2022: We have had success in controlling his drainage. The tissue maceration has improved significantly and his swelling has come down quite markedly. The medial right leg wound is much more superficial and has substantially less nonviable tissue present. The anterior tibial wound is a little bit larger, however with some nonviable tissue present. 09/15/2022: The dorsal foot wound is nearly closed. The anterior tibial wound measures slightly larger today. The medial right leg wound continues to improve quite dramatically with less nonviable tissue and more robust-looking granulation. Edema control is significantly better. 09/22/2022: The left dorsal foot wound remains open, albeit quite tiny. The anterior tibial wound is a little bit larger again today. The medial right leg wound continues to  improve with an increase in robust and healthy granulation tissue and less accumulation of nonviable tissue. 09/29/2022: His home health providers  wrapped his left leg extremely tightly and he ended up having to cut the wrap off of his foot. This resulted in his foot being very swollen and that the dorsal foot wound is a little bit larger today. The anterior tibial wound is about the same size with a little slough on the surface. The medial right leg wound continues to fill in. There are very few areas with any significant depth. There is still slough accumulation. 3/13; Patient has a left dorsal foot wound along with a right anterior tibial wound and right medial wound. We are using silver alginate to the left dorsal foot wound, Santyl and sodium thiosulfate compound ointment to the right lower extremity wounds under 4 layer compression. Patient has no issues or complaints today. 10/13/2022: The left dorsal foot wound is a little bit bigger today, but his foot is also a little bit more swollen. The right medial leg wound has filled in more and has substantially less depth. There is still some nonviable tissue present. The small anterior lower leg wound looks about the same. 10/20/2022: The left dorsal foot wound is epithelializing. There does not appear to be much open at this site. There is some eschar around the edges. The right medial leg wound continues to fill in with granulation tissue. The small anterior lower leg wound is a little bit bigger and there is a chunk of calcium protruding. He continues to have fairly copious drainage from the right leg. 10/27/2022: His home health agency failed to show up at all this past week and as a result, the excessive drainage from his right leg wound has resulted in tissue breakdown. His leg is red and irritated. The wounds themselves look about the same. The wound on his left dorsal foot is nearly closed underneath some eschar. 11/03/2022: We continue to have difficulty with his home health agency. He continues to have significant drainage from his right leg wound which has resulted in even further tissue  breakdown. He also was not taking his Lasix as prescribed and I think much of the drainage has been his lymphedema fluid choosing the path of least resistance. The wound on his left dorsal foot is closed, but in the process of cleaning the site, dry skin was pulled off and he has a new small superficial wound just adjacent to where the dorsal foot wound was. 11/12/2022: His left dorsal foot is completely healed. The right medial leg continues to deteriorate. He is not taking his Lasix as prescribed, nor is he using his lymphedema pumps at all. As a result, he has massive amounts of drainage saturating his dressings and causing further tissue breakdown on his medial leg. Today, his dressings had a foul odor, as well. 11/19/2022: His right medial leg looks even worse today. There is more erythema and moisture related tissue breakdown. The 2 anterior wounds look a little bit better. The culture that I took last week did produce Pseudomonas and he is currently taking levofloxacin for this. 11/26/2022: There has been significant improvement to his right medial leg. It is far less raw and many of the superficial open areas have epithelialized. He continues to cut the foot portion of his wrap and as result his foot is bulbous and swollen compared to the rest of his leg, where edema control is good. The smaller of the 2 anterior tibial wound is  closed, while the larger is contracting somewhat with a bit of slough at the base. 12/03/2022: His leg continues to demonstrate improvement. There has been epithelialization of much of the denuded area. He has a new small anterior tibial wound that has opened around a nidus of calcium. Edema control is improved. 12/10/2022: Continued improvement in the periwound skin. The anterior tibial wounds are about the same, but the medial leg wound has improved further. There is less nonviable tissue present. 12/17/2022: Between his visit on Tuesday with the nurse and today, he has had  significantly more drainage which has resulted in some periwound tissue maceration. I suspect this correlates somewhat with his diuretic use, as he says he takes his diuretic religiously on Saturday, Sunday, and Monday and then does not take it after Tuesday due to being out and about and not being close to easy restroom access. I suspect the excess fluid is just simply draining out of his leg. 12/21/2022: This was supposed to be a nurse visit, but his leg has broken down so significantly, that we have converted to a wound care visit. He did take his diuretics over the weekend, but despite this, he has had copious drainage and all of his tissues have broken down substantially from the right medial leg ulcer all the way down to his ankle and even wrapping posterior to this. There is blue-green staining on his dressing, suggesting ongoing presence of Pseudomonas aeruginosa. 12/24/2022: The wound already looks better than it did 2 days ago. He has been taking ciprofloxacin and the Urgo clean dressing we applied did a much better job of wicking the drainage away from his leg. There is slough on the entire surface of the medial leg wound, but the erythema and induration has improved in the past couple of days. 12/31/2022: Continued improvement of the wound. He says that the pain has basically abated since being on ciprofloxacin. Still with fairly heavy drainage and slough accumulation. 01/07/2023: Overall continued improvement. Still with heavy slough buildup, but less drainage. 01/14/2023: There is a little bit of improvement. The skin is still looking a bit excoriated. He has a few more days left of ciprofloxacin. 01/21/2023: There has been significant improvement. The periwound skin is in much better condition. The ulcers are shallower and actually have some good granulation tissue present under some slough. 01/28/2023: The wound continues to improve. All of the open areas measured smaller today. There is  slough accumulation on the surfaces. His drainage has decreased markedly. 02/04/2023: Continued improvement in the wound. Drainage remains significantly diminished. Slough accumulation is present but to a lesser extent. Periwound skin continues to improve. Edema control is good. [name], [name] ([id number]) [id number] Page 5 of 16 02/18/2023: The heavy drainage persists, although it is was not as significant today as it was at his nurse visit on Tuesday. The drainage is yellow and does not have the blue-green discoloration suggestive of Pseudomonas. The actual wounds on his leg are getting smaller, but the periwound skin is quite excoriated. 02/25/2023: His periwound skin is quite excoriated and he has a lot of clear yellow drainage. The main wounds are filling in further and epithelializing. 03/04/2023: The excoriation of the periwound skin has improved. Most of that area has dried up. The main wounds have less slough in them today. 03/11/2023: The main wounds have accumulated a little bit of slough. The periwound excoriation continues to improve. Unfortunately, he suffered a fall over the previous weekend and has abrasions on his knees bilaterally and  on his right wrist and upper arm. In addition, the skin on his left lower leg apparently blistered and opened, leaving several superficial wounds on this extremity. He has been approved for the [facility] and Press wound VAC and has the equipment with him today. 03/18/2023: He has extensive excoriation to the periwound skin on the right medial lower leg. The actual wounds themselves are smaller with slough accumulation. The top of his left foot has begun weeping again. Using the [facility] and Press wound VAC, he says that he through 4 canisters. He seems to be very fluid overloaded, without any significant dyspnea. 03/25/2023: The wound to his left anterior tibial surface has healed. The left dorsal foot wound has gotten smaller and has a small  area of hypertrophic granulation tissue present. The periwound skin on the right medial lower leg looks slightly better, but still appears excoriated. The actual wounds continue to contract but are still accumulating quite a bit of slough. The tiny right anterior leg wounds are contracting. 04/01/2023: The left dorsal foot wound got a little bit bigger this week. There is slough accumulation. The periwound skin on the right medial lower leg continues to improve. The main medial right lower leg wounds have a dark gray discoloration and there is a strong odor coming from them. The back of his right lower leg just above the ankle also has a little bit of new breakdown where he is draining edema fluid. The small anterior leg wounds are stable. 04/08/2023: Since starting antibiotics last week, there has been tremendous improvement. The left dorsal foot wound is nearly closed with just a little bit of eschar on the surface. The right medial lower leg is less excoriated and red and he has had substantially less drainage. The amount of slough on the open areas of his wounds has decreased markedly and there is no longer any odor. The back of his right lower leg that was newly open last week has dried up, but is not completely closed yet. 04/15/2023: The improvement continues with significantly less drainage, resulting in less excoriation and irritation. The back of his right lower leg is closed, as is the left dorsal foot wound. The open areas on the right medial leg are smaller. 04/22/2023: His drainage has diminished significantly. The tissue maceration and excoriation surrounding the right medial leg wound has also improved substantially. There is slough on all of the open wound surfaces. He does have a bit of an odor coming from his wound today, but there is no purulent drainage nor any significant discoloration of the drainage in his VAC. 04/29/2023: He has a new wound on his left lower anterior tibial surface.  This was a blister that came up and then ruptured. There is thin slough on the wound surface but no concern for infection. He has had significantly diminished drainage from his right medial leg ulcers. He was previously going through 4 canisters a day secondary to drainage and this week, he has used only 1 for the entire week. The open areas on his right medial leg are smaller and fewer in number. There is slough on all of the surfaces. No malodor appreciated. 05/06/2023: The wound on his left anterior tibial surface has epithelialized significantly. There is just a small area of pain in the center. The wound is clean without any slough or other debris accumulation. The drainage on the right leg has almost completely dry. There is slough on the open wound surfaces with significantly more epithelialized between the  open sites. He continues on levofloxacin. Electronic Signature(s) Signed: 05/06/2023 11:48:35 AM By: Duanne Guess MD FACS Entered By: Duanne Guess on 05/06/2023 08:48:35 -------------------------------------------------------------------------------- Physical Exam Details Patient Name: Date of Service: AVYUKT, CIMO [name] 05/06/2023 11:00 A M Medical Record Number: [id number] Patient Account Number: [id number] Date of Birth/Sex: Treating RN: 1951/03/10 (72 y.o. M) Primary Care Provider: [name] Other Clinician: Referring Provider: Treating Provider/Extender: [name] in Treatment: 46 Constitutional Hypertensive, asymptomatic. . . . no acute distress. Respiratory Normal work of breathing on room air. Notes 05/06/2023: The wound on his left anterior tibial surface has epithelialized significantly. There is just a small area of pain in the center. The wound is clean without any slough or other debris accumulation. The drainage on the right leg has almost completely dry. There is slough on the open wound surfaces with significantly more  epithelialized between the open sites. Electronic Signature(s) Signed: 05/06/2023 11:54:59 AM By: Duanne Guess MD FACS Entered By: Duanne Guess on 05/06/2023 08:54:59 [name] ([id number]) [id number] Page 6 of 16 -------------------------------------------------------------------------------- Physician Orders Details Patient Name: Date of Service: ARVIND, MEXICANO 05/06/2023 11:00 A M Medical Record Number: [id number] Patient Account Number: [id number] Date of Birth/Sex: Treating RN: [birth year] (72 y.o. 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 - Dr. [name] RM 1 Return A Friday 05/13/2023 @ 1100 Anesthetic (In clinic) Topical Lidocaine 4% applied to wound bed Bathing/ Shower/ Hygiene May shower with protection but do not get wound dressing(s) wet. Protect dressing(s) with water repellant cover (for example, large plastic bag) or a cast cover and may then take shower. Negative Presssure Wound Therapy Wound Vac to wound continuously at 114mm/hg pressure - hold VAC this week Black Foam - peel and place Edema  Control - Lymphedema / SCD / Other Lymphedema Pumps. Use Lymphedema pumps on leg(s) 2-3 times a day for 45-60 minutes. If wearing any wraps or hose, do not remove them. Continue exercising as instructed. Elevate legs to the level of the heart or above for 30 minutes daily and/or when sitting for 3-4 times a day throughout the day. - throughout the day Avoid standing for long periods of time. Exercise regularly Wound Treatment Wound #10 - Lower Leg Wound Laterality: Left, Anterior Peri-Wound Care: Sween Lotion (Moisturizing lotion) 1 x Per Week/30 Days Discharge Instructions: Apply moisturizing lotion as directed Prim Dressing: Maxorb Extra Ag+ Alginate Dressing, 2x2 (in/in) 1 x Per Week/30 Days ary Discharge Instructions: Apply to wound bed as instructed Secondary Dressing: ABD Pad, 8x10 1 x Per Week/30 Days Discharge Instructions: Apply over primary dressing as directed. Compression Wrap: ThreePress (3 layer compression wrap) 1 x Per Week/30 Days Discharge Instructions: Apply three layer compression as directed. Wound #3 - Lower Leg Wound Laterality: Right, Medial Peri-Wound Care: Skin Prep 1 x Per Week/30 Days Discharge Instructions: cavelon to macerated  wound margins Peri-Wound Care: Sween Lotion (Moisturizing lotion) 1 x Per Week/30 Days [name], [name] ([id number]) [id number] Page 7 of 16 Discharge Instructions: Apply moisturizing lotion as directed Prim Dressing: Maxorb Extra Ag+ Alginate Dressing, 4x4.75 (in/in) 1 x Per Week/30 Days ary Discharge Instructions: Apply to excoriated weeping areas Secondary Dressing: Zetuvit Plus 4x8 in 1 x Per Week/30 Days Discharge Instructions: Apply over primary dressing as directed. Compression Wrap: ThreePress (3 layer compression wrap) 1 x Per Week/30 Days Discharge Instructions: Apply three layer compression as directed. Wound #6 - Lower Leg Wound Laterality: Right, Anterior Peri-Wound Care: Sween Lotion  (Moisturizing lotion) 1 x Per Week/30 Days Discharge Instructions: Apply moisturizing lotion as directed Prim Dressing: Maxorb Extra Ag+ Alginate Dressing, 2x2 (in/in) 1 x Per Week/30 Days ary Discharge Instructions: Apply to wound bed as instructed Secondary Dressing: ABD Pad, 8x10 1 x Per Week/30 Days Discharge Instructions: Apply over primary dressing as directed. Compression Wrap: ThreePress (3 layer compression wrap) 1 x Per Week/30 Days Discharge Instructions: Apply three layer compression as directed. Electronic Signature(s) Signed: 05/06/2023 12:03:38 PM By: Duanne Guess MD FACS Previous Signature: 05/06/2023 11:51:00 AM Version By: Zenaida Deed RN, BSN Entered By: Duanne Guess on 05/06/2023 08:55:24 -------------------------------------------------------------------------------- Problem List Details Patient Name: Date of Service: [name] 05/06/2023 11:00 A M Medical Record Number: [id number] Patient Account Number: [id number] Date of Birth/Sex: Treating RN: 1950-10-20 (72 y.o. 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 02/09/2022 No Yes I89.0 Lymphedema, not elsewhere classified 02/09/2022 No Yes I87.2 Venous insufficiency (chronic) (peripheral) 02/09/2022 No Yes Z86.718 Personal history of other venous thrombosis and embolism 02/09/2022 No Yes [name], [name] ([id number]) [phone].pdf Page 8 of 16 I10 Essential (primary) hypertension 02/09/2022 No Yes I25.10 Atherosclerotic heart disease of  native coronary artery without angina pectoris 02/09/2022 No Yes Inactive Problems ICD-10 Code Description Active Date Inactive Date L97.512 Non-pressure chronic ulcer of other part of right foot with fat layer exposed 02/17/2022 02/17/2022 Resolved Problems ICD-10 Code Description Active Date Resolved Date L97.821 Non-pressure chronic ulcer of other part of left lower leg limited to breakdown of skin 03/11/2023 03/11/2023 L98.492 Non-pressure chronic ulcer of skin of other sites with fat layer exposed 03/11/2023 03/11/2023 L97.521 Non-pressure chronic ulcer of other part of left foot limited to breakdown of skin 03/18/2023 02/09/2022 Electronic Signature(s) Signed: 05/06/2023 11:46:53 AM By: Duanne Guess MD FACS Entered By: Duanne Guess on 05/06/2023 08:46:53 -------------------------------------------------------------------------------- Progress Note Details Patient Name: Date of Service: [name] 05/06/2023 11:00 A M Medical Record Number: [id number] Patient Account Number: [id number] Date of Birth/Sex: Treating RN: March 25, 1951 (72 y.o. M) Primary Care Provider: [name] Other Clinician: Referring Provider: Treating Provider/Extender: [name] in Treatment: 58 Subjective Chief Complaint Information obtained from Patient Patient seen for complaints of Non-Healing Wounds. History of Present Illness (HPI) ADMISSION 02/09/2022 This is a 72 year old man who recently moved to [place] from [place]. He has a history of of coronary artery disease and prior left popliteal vein DVT . He is not diabetic and does not smoke. 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 He has had multiple spinal surgeries and  has limited mobility. He primarily uses a motorized wheelchair. He has had lymphedema for many years and does have lymphedema pumps although he says he does not use them frequently. He has wounds on his bilateral lower extremities.  He was receiving care in [place] for these prior to his move. They were apparently packing his wounds with Betadine soaked gauze. He has worn compression wraps in the past but not recently. He did say that they helped tremendously. In clinic today, his right ABI is noncompressible but has good signals; the left ABI was 1.17. No formal vascular studies have been performed. On his left dorsal foot, there is a circular lesion that exposes the fat layer. There is fairly heavy slough accumulation within the wound, but no significant odor. On his right medial calf, he has multiple pinholes that have slough buildup in them and probe for about 2 to 4 mm in depth. They are draining clear fluid. On his right lateral midfoot, he has ulceration consistent with venous stasis. It is geographic and has slough accumulation. 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 02/17/2022: All of the wounds are little bit larger today and have more slough accumulation. Today, the medial calf openings are larger and probe to something [name], [name] ([id number]) [id number] Page 9 of 16 that feels calcified. There is odor coming from his wounds. His vascular studies are scheduled for August 9 and 10. 02-24-2022 upon evaluation today patient presents for follow-up concerning his ongoing issues here with his lower extremities. With that being said he does have significant problems with what appears to be cellulitis unfortunately the PCR culture that was sent last week got crushed by [employer] in transit. With that being said we are going to reobtain a culture today although I am actually to do it on the right medial leg as this seems to be the most inflamed area today where there is some questionable drainage as well. I am going to remove some of the calcium deposits which are loosening obtain a deeper culture from this area.  Fortunately I do not see any evidence of active infection systemically which is good news but locally I think we are having a bigger issue here. He does have his appointment next Thursday with vascular. Patient has also been referred to [facility] for further evaluation and treatment of the calcinosis for possible sulfate injections. 03/04/2022: The culture that was performed last week grew out multiple species including Klebsiella, Morganella, and Pseudomonas aeruginosa. He had been prescribed Bactrim but this was not adequate to cover all of the species and levofloxacin was recommended. He completed the Bactrim but did not pick up the levofloxacin and begin taking it until yesterday. 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 He had arterial studies performed today. He does have evidence of peripheral arterial disease. Results copied here: +-------+-----------+-----------+------------+------------+ ABI/TBIT oday's ABIT oday's TBIPrevious ABIPrevious TBI +-------+-----------+-----------+------------+------------+ Right [employer] 0.75    +-------+-----------+-----------+------------+------------+ Left [place] 0.57    +-------+-----------+-----------+------------+------------+ Arterial wall calcification precludes accurate ankle pressures and ABIs. Summary: Right: Resting right ankle-brachial index indicates noncompressible right lower extremity arteries. The right toe-brachial index is normal. Left: Resting left ankle-brachial index indicates noncompressible left lower extremity arteries. The left toe-brachial index is abnormal. He continues to have further tissue breakdown at the right medial  calf and there is ample slough accumulation along with necrotic subcutaneous tissue. The left dorsal foot wound has built up  slough, as well. The right medial foot wound is nearly closed with just a light layer of eschar over the surface. The right dorsal lateral foot wound has also accumulated a fair amount of slough and remains quite tender. 03/10/2022: He has been taking the prescribed levofloxacin and has about 3 days left of this. The erythema and induration on his right leg has improved. He continues to experience further breakdown of the right medial calf wound. There is extensive slough and debris accumulation there. There is heavy slough on his left dorsal foot wound, but no odor or purulent drainage. The right medial foot wound appears to be closed. The right dorsal lateral foot wound also has a fair amount of slough but it is less tender than at our last visit. He still does not have an appointment with dermatology. 03/22/2022: The right anterior tibial wound has closed. The right lateral foot wound is nearly closed with just a little bit of slough. The left dorsal foot wound is smaller and continues to have some slough buildup but less so than at prior visits. There is good granulation tissue underlying the slough. Unfortunately the right medial calf wound continues to deteriorate. It has a foul odor today and a lot of necrotic tissue, the surrounding skin is also erythematous and moist. 03/30/2022: The right lateral foot wound continues to contract and just has a little bit of slough and eschar present. The left dorsal foot wound is also smaller with slough overlying good granulation tissue. The right medial calf wound actually looks quite a bit better today; there is no odor and there is much less necrotic tissue although some remains present. We have been using topical gentamicin and he has been taking oral Augmentin. 04/07/2022: The patient saw dermatology at [facility] last week. Unfortunately, it appears that they did not read any of the documentation that was provided. 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 Overall, it was a highly unsatisfactory consultation. In the meantime, however, we were able to identify compounding pharmacy who could create a topical sodium thiosulfate ointment. The patient has that with him today. Both of the right foot wounds are now closed. The left dorsal foot wound has contracted but still has a layer of slough on the surface. The medial calf wounds on the right all look a little bit better. They still have heavy slough along with the chunks of calcium. Everything is stained purple. 04/14/2022: The wound on his dorsal left foot is a little bit smaller again today. There is still slough accumulation on the surface. The medial calf wounds have quite a bit less slough accumulation today. His right leg, however, is warm and erythematous, concerning for cellulitis. 04/14/2022: He completed his course of Augmentin yesterday. The medial calf wound sites are much cleaner today and shallower. There is less fragmented calcium in the wounds. He has a lot of lymphedema fluid-like drainage from these wounds, but no purulent discharge or malodor. The wound on his dorsal left foot has also contracted and is shallower. There is a layer of slough over healthy granulation tissue. 05/05/2022: The left dorsal foot wound is  smaller today with less slough and more granulation tissue. The right medial calf wounds are shallower, but have extensive slough and some fat necrosis. The drainage has diminished considerably, however. 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 05/12/2022: The left dorsal foot wound continues to  contract and fill with granulation tissue. There is slough on the wound surface. The right medial calf wounds also continue to fill with healthy tissue, but there is remaining slough and fat necrosis present. He had a significant increase in his drainage this week and it was a bright yellow-green color. He also had a new wound open over his right anterior tibial surface associated with the spike of cutaneous calcium. The leg is more red, as well, concerning for infection. 05/19/2022: His culture returned positive for Pseudomonas. He is currently taking a course of oral levofloxacin. We have also been using topical gentamicin mixed in with his sodium thiosulfate. All of the wounds look better this week. 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 There is slough and nonviable subcutaneous tissue still present. The new anterior tibial wound just has a light layer of slough. The dorsal foot wound also has some slough present and appears a little dry today. 05/26/2022: The right medial leg wound continues to improve. It is feeling with granulation tissue, but still accumulates a fairly thick layer of slough on the surface. The more recent anterior tibial wound has remained quite small, but also has some slough on the surface. The left dorsal foot wound has contracted somewhat and has perimeter epithelialization. He completed his oral levofloxacin. 06/02/2022: The left dorsal foot wound continues to improve significantly. There is just a little slough on the wound surface. The right medial leg wound had black drainage, but it looks like silver alginate was applied last week and I theorized that silver sulfide was created with a combination of the silver alginate and the sodium thiosulfate. It did, however, have a bit of an odor to it and extensive slough accumulation. The small anterior tibial wound also had a bit of slough and nonviable subcutaneous tissue present. The skin of  his leg was rather macerated, as well. [name], [name] ([id number]) [id number] Page 10 of 16 06/09/2022: The left dorsal foot wound is smaller again this week with just a light layer of slough on the surface. The right medial leg wound looks substantially better. The leg and periwound skin are no longer red and macerated. There is good granulation tissue forming with less slough and nonviable tissue. The anterior tibial wound just has a small amount of slough accumulation. 06/16/2022: The left dorsal foot wound continues to contract. His wrap slipped a little bit, however, and his foot is more swollen. The right medial leg wound continues to improve. The underlying tissue is more vital-appearing and there is less slough. He does have substantial drainage. The small anterior tibial wound has a bit of slough accumulation. 06/23/2022: The left dorsal foot wound is about half the size as it was last week. There is just some light slough on the surface and some eschar buildup around the edges. The right anterior leg wound is small with a little slough on the surface. Unfortunately, the right medial leg wound deteriorated fairly substantially. It is malodorous with gray/green/black discoloration. 06/30/2022: The left dorsal foot wound continues to contract. It is nearly closed with just a light layer  of slough present. The right anterior leg wound is about the same size and has a small amount of slough on the surface. The right medial leg wound looks a lot better than it did last week. The odor has abated and the tissue is less pulpy and necrotic-looking. There is still a fairly thick layer of slough present. 12/13; the left dorsal foot wound looks very healthy. There was no need to change the dressing here and no debridement. He has a small area on the right anterior lower leg apparently had not changed much in size. The area on the medial leg is the most problematic area this is  large with some depth and a completely nonviable surface today. 07/14/2022: The left dorsal foot wound is smaller today with just a little slough and eschar present. The right anterior lower leg wound is about the same without any significant change. The medial leg wound has a black and green surface and is malodorous. No purulent drainage and the periwound skin is intact. Edema control is suboptimal. 07/21/2022: The dorsal foot wound continues to contract and is nearly closed with just a little bit of slough and eschar on the surface. The right anterior lower leg wound is shallower today but otherwise the dimensions are unchanged. The medial leg wound looks significantly better although it still has a nonviable surface; the odor and black/green surface has resolved. His culture came back with multiple species sensitive to Augmentin, which was prescribed and a very low level of Pseudomonas, which should be handled by the topical gentamicin we have been using. 07/28/2022: The dorsal foot wound is down to just a very tiny opening with a little eschar on the surface. The right anterior tibial wound is about the same with some slough buildup. The right medial leg wound looks quite a bit better this week. There is thick rubbery slough on the surface, but the odor and drainage has improved significantly. 08/04/2022: The dorsal foot wound is almost healed, but the periwound skin has broken down. This appears to be secondary to moisture and adhesive from the absorbent pad. The right anterior tibial wound is shallower. The right medial leg wound continues to improve. It is smaller, still with rubbery slough on the surface. No malodor or purulent drainage. 08/11/2022: The dorsal foot wound is about the same size. It is a little bit dry with some slough accumulation. The right anterior tibial wound is also unchanged. The right medial leg wound is filling in further with good granulation tissue. Still with some  slough buildup on the surface. 08/18/2022: His left leg is bright red, hot, and swollen with cellulitis. The dorsal foot wound actually looks quite good and is superficial with just a little bit of slough accumulation. The right anterior tibial wound is unchanged. The right medial leg wound has filled in with good granulation tissue but has copious amounts of drainage. 08/25/2022: His cellulitis has responded nicely to the oral antibiotics. His dorsal foot wound is smaller but has a fair amount of slough buildup. The right anterior tibial wound remains stable and unchanged. The right medial leg wound has more granulation tissue under a layer of slough, but continues to have copious drainage. The drainage is actually causing skin irritation and threatens to result in breakdown. 09/01/2022: He continues to have profuse drainage from his wounds which is resulting in tissue maceration on both the dorsum of his left foot and the periwound distal to his medial leg wound. The dorsal foot wound is  nearly healed, despite this. The medial right leg wound is filling in with good granulation tissue. We are working on getting home health assistance to change his dressings more frequently to try and control the drainage better. 09/08/2022: We have had success in controlling his drainage. The tissue maceration has improved significantly and his swelling has come down quite markedly. The medial right leg wound is much more superficial and has substantially less nonviable tissue present. The anterior tibial wound is a little bit larger, however with some nonviable tissue present. 09/15/2022: The dorsal foot wound is nearly closed. The anterior tibial wound measures slightly larger today. The medial right leg wound continues to improve quite dramatically with less nonviable tissue and more robust-looking granulation. Edema control is significantly better. 09/22/2022: The left dorsal foot wound remains open, albeit quite tiny.  The anterior tibial wound is a little bit larger again today. The medial right leg wound continues to improve with an increase in robust and healthy granulation tissue and less accumulation of nonviable tissue. 09/29/2022: His home health providers wrapped his left leg extremely tightly and he ended up having to cut the wrap off of his foot. This resulted in his foot being very swollen and that the dorsal foot wound is a little bit larger today. The anterior tibial wound is about the same size with a little slough on the surface. The medial right leg wound continues to fill in. There are very few areas with any significant depth. There is still slough accumulation. 3/13; Patient has a left dorsal foot wound along with a right anterior tibial wound and right medial wound. We are using silver alginate to the left dorsal foot wound, Santyl and sodium thiosulfate compound ointment to the right lower extremity wounds under 4 layer compression. Patient has no issues or complaints today. 10/13/2022: The left dorsal foot wound is a little bit bigger today, but his foot is also a little bit more swollen. The right medial leg wound has filled in more and has substantially less depth. There is still some nonviable tissue present. The small anterior lower leg wound looks about the same. 10/20/2022: The left dorsal foot wound is epithelializing. There does not appear to be much open at this site. There is some eschar around the edges. The right medial leg wound continues to fill in with granulation tissue. The small anterior lower leg wound is a little bit bigger and there is a chunk of calcium protruding. He continues to have fairly copious drainage from the right leg. 10/27/2022: His home health agency failed to show up at all this past week and as a result, the excessive drainage from his right leg wound has resulted in tissue breakdown. His leg is red and irritated. The wounds themselves look about the same. The  wound on his left dorsal foot is nearly closed underneath some eschar. 11/03/2022: We continue to have difficulty with his home health agency. He continues to have significant drainage from his right leg wound which has resulted in even further tissue breakdown. He also was not taking his Lasix as prescribed and I think much of the drainage has been his lymphedema fluid choosing the path of least resistance. The wound on his left dorsal foot is closed, but in the process of cleaning the site, dry skin was pulled off and he has a new small superficial wound just adjacent to where the dorsal foot wound was. 11/12/2022: His left dorsal foot is completely healed. The right medial leg continues  to deteriorate. He is not taking his Lasix as prescribed, nor is he using his lymphedema pumps at all. As a result, he has massive amounts of drainage saturating his dressings and causing further tissue breakdown on his medial leg. Today, his dressings had a foul odor, as well. 11/19/2022: His right medial leg looks even worse today. There is more erythema and moisture related tissue breakdown. The 2 anterior wounds look a little bit better. The culture that I took last week did produce Pseudomonas and he is currently taking levofloxacin for this. JAYCOB, MCCLENTON ([id number]) [id number] Page 11 of 16 11/26/2022: There has been significant improvement to his right medial leg. It is far less raw and many of the superficial open areas have epithelialized. He continues to cut the foot portion of his wrap and as result his foot is bulbous and swollen compared to the rest of his leg, where edema control is good. The smaller of the 2 anterior tibial wound is closed, while the larger is contracting somewhat with a bit of slough at the base. 12/03/2022: His leg continues to demonstrate improvement. There has been epithelialization of much of the denuded area. He has a new small anterior tibial wound that  has opened around a nidus of calcium. Edema control is improved. 12/10/2022: Continued improvement in the periwound skin. The anterior tibial wounds are about the same, but the medial leg wound has improved further. There is less nonviable tissue present. 12/17/2022: Between his visit on Tuesday with the nurse and today, he has had significantly more drainage which has resulted in some periwound tissue maceration. I suspect this correlates somewhat with his diuretic use, as he says he takes his diuretic religiously on Saturday, Sunday, and Monday and then does not take it after Tuesday due to being out and about and not being close to easy restroom access. I suspect the excess fluid is just simply draining out of his leg. 12/21/2022: This was supposed to be a nurse visit, but his leg has broken down so significantly, that we have converted to a wound care visit. He did take his diuretics over the weekend, but despite this, he has had copious drainage and all of his tissues have broken down substantially from the right medial leg ulcer all the way down to his ankle and even wrapping posterior to this. There is blue-green staining on his dressing, suggesting ongoing presence of Pseudomonas aeruginosa. 12/24/2022: The wound already looks better than it did 2 days ago. He has been taking ciprofloxacin and the Urgo clean dressing we applied did a much better job of wicking the drainage away from his leg. There is slough on the entire surface of the medial leg wound, but the erythema and induration has improved in the past couple of days. 12/31/2022: Continued improvement of the wound. He says that the pain has basically abated since being on ciprofloxacin. Still with fairly heavy drainage and slough accumulation. 01/07/2023: Overall continued improvement. Still with heavy slough buildup, but less drainage. 01/14/2023: There is a little bit of improvement. The skin is still looking a bit excoriated. He has a few  more days left of ciprofloxacin. 01/21/2023: There has been significant improvement. The periwound skin is in much better condition. The ulcers are shallower and actually have some good granulation tissue present under some slough. 01/28/2023: The wound continues to improve. All of the open areas measured smaller today. There is slough accumulation on the surfaces. His drainage has decreased markedly. 02/04/2023: Continued improvement in the  wound. Drainage remains significantly diminished. Slough accumulation is present but to a lesser extent. Periwound skin continues to improve. Edema control is good. 02/18/2023: The heavy drainage persists, although it is was not as significant today as it was at his nurse visit on Tuesday. The drainage is yellow and does not have the blue-green discoloration suggestive of Pseudomonas. The actual wounds on his leg are getting smaller, but the periwound skin is quite excoriated. 02/25/2023: His periwound skin is quite excoriated and he has a lot of clear yellow drainage. The main wounds are filling in further and epithelializing. 03/04/2023: The excoriation of the periwound skin has improved. Most of that area has dried up. The main wounds have less slough in them today. 03/11/2023: The main wounds have accumulated a little bit of slough. The periwound excoriation continues to improve. Unfortunately, he suffered a fall over the previous weekend and has abrasions on his knees bilaterally and on his right wrist and upper arm. In addition, the skin on his left lower leg apparently blistered and opened, leaving several superficial wounds on this extremity. He has been approved for the [facility] wound VAC and has the equipment with him today. 03/18/2023: He has extensive excoriation to the periwound skin on the right medial lower leg. The actual wounds themselves are smaller with slough accumulation. The top of his left foot has begun weeping again. Using the [facility]  wound VAC, he says that he through 4 canisters. He seems to be very fluid overloaded, without any significant dyspnea. 03/25/2023: The wound to his left anterior tibial surface has healed. The left dorsal foot wound has gotten smaller and has a small area of hypertrophic granulation tissue present. The periwound skin on the right medial lower leg looks slightly better, but still appears excoriated. The actual wounds continue to contract but are still accumulating quite a bit of slough. The tiny right anterior leg wounds are contracting. 04/01/2023: The left dorsal foot wound got a little bit bigger this week. There is slough accumulation. The periwound skin on the right medial lower leg continues to improve. The main medial right lower leg wounds have a dark gray discoloration and there is a strong odor coming from them. The back of his right lower leg just above the ankle also has a little bit of new breakdown where he is draining edema fluid. The small anterior leg wounds are stable. 04/08/2023: Since starting antibiotics last week, there has been tremendous improvement. The left dorsal foot wound is nearly closed with just a little bit of eschar on the surface. The right medial lower leg is less excoriated and red and he has had substantially less drainage. The amount of slough on the open areas of his wounds has decreased markedly and there is no longer any odor. The back of his right lower leg that was newly open last week has dried up, but is not completely closed yet. 04/15/2023: The improvement continues with significantly less drainage, resulting in less excoriation and irritation. The back of his right lower leg is closed, as is the left dorsal foot wound. The open areas on the right medial leg are smaller. 04/22/2023: His drainage has diminished significantly. The tissue maceration and excoriation surrounding the right medial leg wound has also improved substantially. There is slough on all of  the open wound surfaces. He does have a bit of an odor coming from his wound today, but there is no purulent drainage nor any significant discoloration of the  drainage in his VAC. 04/29/2023: He has a new wound on his left lower anterior tibial surface. This was a blister that came up and then ruptured. There is thin slough on the wound surface but no concern for infection. He has had significantly diminished drainage from his right medial leg ulcers. He was previously going through 4 canisters a day secondary to drainage and this week, he has used only 1 for the entire week. The open areas on his right medial leg are smaller and fewer in number. There is slough on all of the surfaces. No malodor appreciated. 05/06/2023: The wound on his left anterior tibial surface has epithelialized significantly. There is just a small area of pain in the center. The wound is clean without any slough or other debris accumulation. The drainage on the right leg has almost completely dry. There is slough on the open wound surfaces with significantly more epithelialized between the open sites. He continues on levofloxacin. Patient History Information obtained from Patient, Chart. Family History Cancer - Mother, Heart Disease - Father,Mother, Hypertension - Joanna Hews, Lung Disease - Siblings, KENY, DONALD ([id number]) [id number] Page 12 of 16 No family history of Diabetes, Hereditary Spherocytosis, Kidney Disease, Seizures, Stroke, Thyroid Problems, Tuberculosis. Social History Former smoker - quit 1991, Marital Status - Married, Alcohol Use - Moderate, Drug Use - No History, Caffeine Use - Daily - coffee. Medical History Eyes Patient has history of Cataracts - right eye removed Denies history of Glaucoma, Optic Neuritis Ear/Nose/Mouth/Throat Denies history of Chronic sinus problems/congestion, Middle ear problems Hematologic/Lymphatic Patient has history of Anemia - macrocytic,  Lymphedema Cardiovascular Patient has history of Congestive Heart Failure, Deep Vein Thrombosis - left leg, Hypertension, Peripheral Venous Disease Endocrine Denies history of Type I Diabetes, Type II Diabetes Genitourinary Denies history of End Stage Renal Disease Integumentary (Skin) Denies history of History of Burn Oncologic Denies history of Received Chemotherapy, Received Radiation Psychiatric Denies history of Anorexia/bulimia, Confinement Anxiety Hospitalization/Surgery History - cervical fusion. - lumbar surgery. - coronary stent placement. - lasik eye surgery. - hydrocele excision. Medical A Surgical History Notes nd Constitutional Symptoms (General Health) obesity Ear/Nose/Mouth/Throat hard of hearing Respiratory frozen diaphragm right Cardiovascular hyperlipidemia Musculoskeletal myositis, lumbar DDD, spinal stenosis, cervical facet joint syndrome Objective Constitutional Hypertensive, asymptomatic. no acute distress. Vitals Time Taken: 10:54 AM, Height: 71 in, Weight: 267 lbs, BMI: 37.2, Temperature: 97.8 F, Pulse: 61 bpm, Respiratory Rate: 20 breaths/min, Blood Pressure: 151/80 mmHg. Respiratory Normal work of breathing on room air. General Notes: 05/06/2023: The wound on his left anterior tibial surface has epithelialized significantly. There is just a small area of pain in the center. The wound is clean without any slough or other debris accumulation. The drainage on the right leg has almost completely dry. There is slough on the open wound surfaces with significantly more epithelialized between the open sites. Integumentary (Hair, Skin) Wound #10 status is Open. Original cause of wound was Blister. The date acquired was: 04/26/2023. The wound has been in treatment 1 weeks. The wound is located on the Left,Anterior Lower Leg. The wound measures 2cm length x 0.7cm width x 0.1cm depth; 1.1cm^2 area and 0.11cm^3 volume. There is Fat Layer (Subcutaneous Tissue)  exposed. There is no tunneling or undermining noted. There is a medium amount of serosanguineous drainage noted. The wound margin is flat and intact. There is large (67-100%) red granulation within the wound bed. There is a small (1-33%) amount of necrotic tissue within the wound bed including Adherent Slough. The periwound skin appearance  had no abnormalities noted for texture. The periwound skin appearance had no abnormalities noted for moisture. The periwound skin appearance exhibited: Hemosiderin Staining. The periwound skin appearance did not exhibit: Atrophie Blanche, Cyanosis, Ecchymosis, Erythema. Periwound temperature was noted as No Abnormality. Wound #3 status is Open. Original cause of wound was Gradually Appeared. The date acquired was: 12/07/2021. The wound has been in treatment 64 weeks. The wound is located on the Right,Medial Lower Leg. The wound measures 7.6cm length x 5.7cm width x 0.4cm depth; 34.023cm^2 area and 13.609cm^3 volume. There is Fat Layer (Subcutaneous Tissue) exposed. There is no tunneling or undermining noted. There is a medium amount of purulent drainage noted. The wound margin is flat and intact. There is small (1-33%) red granulation within the wound bed. There is a large (67-100%) amount of necrotic tissue within the wound bed including Adherent Slough. The periwound skin appearance had no abnormalities noted for texture. The periwound skin appearance had no abnormalities noted for moisture. The periwound skin appearance exhibited: Hemosiderin Staining, Rubor. The periwound skin appearance did not exhibit: Erythema. Periwound temperature was noted as No Abnormality. The periwound has tenderness on palpation. Wound #6 status is Open. Original cause of wound was Gradually Appeared. The date acquired was: 05/12/2022. The wound has been in treatment 51 weeks. The wound is located on the Right,Anterior Lower Leg. The wound measures 0.4cm length x 0.3cm width x 0.3cm  depth; 0.094cm^2 area and 0.028cm^3 volume. There is Fat Layer (Subcutaneous Tissue) exposed. There is no tunneling or undermining noted. There is a small amount of serous drainage noted. The wound margin is epibole. There is no granulation within the wound bed. There is no necrotic tissue within the wound bed. The periwound skin appearance had no abnormalities noted for texture. The periwound skin appearance had no abnormalities noted for moisture. The periwound skin appearance exhibited: Hemosiderin Staining. Periwound temperature was noted as No Abnormality. 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 There was a Selective/Open Wound Non-Viable Tissue Debridement with a total area of 13.6 sq cm performed by Duanne Guess, MD. With the following instrument(s): Curette to remove Non-Viable tissue/material. Material removed includes [facility] after achieving pain control using Lidocaine 4% Topical Solution. No specimens were taken. A time out was conducted at 11:20, prior to the start of the procedure. A Minimum amount of bleeding was controlled with Pressure. The procedure was tolerated well with a pain level of 3 throughout and a pain level of 2 following the procedure. Post Debridement Measurements: 7.6cm length x 5.7cm width x 0.4cm  depth; 13.609cm^3 volume. Character of Wound/Ulcer Post Debridement is improved. Post procedure Diagnosis Wound #3: Same as Pre-Procedure Pre-procedure diagnosis of Wound #3 is a Lymphedema located on the Right,Medial Lower Leg . There was a Three Layer Compression Therapy Procedure by Zenaida Deed, RN. Post procedure Diagnosis Wound #3: Same as Pre-Procedure Wound #10 Pre-procedure diagnosis of Wound #10 is a Lymphedema located on the Left,Anterior Lower Leg . There was a Three Layer Compression  Therapy Procedure by Zenaida Deed, RN. Post procedure Diagnosis Wound #10: Same as Pre-Procedure Plan Follow-up Appointments: Return Appointment in 1 week. - Dr. [name] RM 1 Friday 05/13/2023 @ 1100 Anesthetic: (In clinic) Topical Lidocaine 4% applied to wound bed Bathing/ Shower/ Hygiene: May shower with protection but do not get wound dressing(s) wet. Protect dressing(s) with water repellant cover (for example, large plastic bag) or a cast cover and may then take shower. Negative Presssure Wound Therapy: Wound Vac to wound continuously at 11mm/hg pressure - hold VAC this week Black Foam - peel and place Edema Control - Lymphedema / SCD / Other: Lymphedema Pumps. Use Lymphedema pumps on leg(s) 2-3 times a day for 45-60 minutes. If wearing any wraps or hose, do not remove them. Continue exercising as instructed. Elevate legs to the level of the heart or above for 30 minutes daily and/or when sitting for 3-4 times a day throughout the day. - throughout the day Avoid standing for long periods of time. Exercise regularly WOUND #10: - Lower Leg Wound Laterality: Left, Anterior Peri-Wound Care: Sween Lotion (Moisturizing lotion) 1 x Per Week/30 Days Discharge Instructions: Apply moisturizing lotion as directed Prim Dressing: Maxorb Extra Ag+ Alginate Dressing, 2x2 (in/in) 1 x Per Week/30 Days ary Discharge Instructions: Apply to wound bed as instructed Secondary Dressing: ABD Pad, 8x10 1  x Per Week/30 Days Discharge Instructions: Apply over primary dressing as directed. Com pression Wrap: ThreePress (3 layer compression wrap) 1 x Per Week/30 Days Discharge Instructions: Apply three layer compression as directed. WOUND #3: - Lower Leg Wound Laterality: Right, Medial Peri-Wound Care: Skin Prep 1 x Per Week/30 Days Discharge Instructions: cavelon to macerated wound margins Peri-Wound Care: Sween Lotion (Moisturizing lotion) 1 x Per Week/30 Days Discharge Instructions: Apply moisturizing lotion as directed Prim Dressing: Maxorb Extra Ag+ Alginate Dressing, 4x4.75 (in/in) 1 x Per Week/30 Days ary Discharge Instructions: Apply to excoriated weeping areas Secondary Dressing: Zetuvit Plus 4x8 in 1 x Per Week/30 Days Discharge Instructions: Apply over primary dressing as directed. Com pression Wrap: ThreePress (3 layer compression wrap) 1 x Per Week/30 Days AVRAM, DANIELSON ([id number]) [id number] Page 14 of 16 Discharge Instructions: Apply three layer compression as directed. WOUND #6: - Lower Leg Wound Laterality: Right, Anterior Peri-Wound Care: Sween Lotion (Moisturizing lotion) 1 x Per Week/30 Days Discharge Instructions: Apply moisturizing lotion as directed Prim Dressing: Maxorb Extra Ag+ Alginate Dressing, 2x2 (in/in) 1 x Per Week/30 Days ary Discharge Instructions: Apply to wound bed as instructed Secondary Dressing: ABD Pad, 8x10 1 x Per Week/30 Days Discharge Instructions: Apply over primary dressing as directed. Com pression Wrap: ThreePress (3 layer compression wrap) 1 x Per Week/30 Days Discharge Instructions: Apply three layer compression as directed. 05/06/2023: The wound on his left anterior tibial surface has epithelialized significantly. There is just a small area of pain in the center. The wound is clean without any slough or other debris accumulation. The drainage on the right leg has almost completely dry. There is slough on the open  wound surfaces with significantly more epithelialized between the open sites. He continues on levofloxacin. The left leg wound did not require debridement. Will continue silver alginate and 3 layer compression here. I debrided slough from the open wound surfaces on the right leg. Continue silver alginate and 3 layer compression here. I want to see how he does without the topical antibiotic ointments on his leg, but we will continue the levofloxacin. Follow-up in 1 week. Electronic Signature(s) Signed: 05/06/2023 12:01:22 PM By: Duanne Guess  MD FACS Entered By: Duanne Guess on 05/06/2023 09:01:22 -------------------------------------------------------------------------------- HxROS Details Patient Name: Date of Service: MARSHELL, DILAURO [name] 05/06/2023 11:00 A M Medical Record Number: [id number] Patient Account Number: [id number] Date of Birth/Sex: Treating RN: 23-Apr-1951 (72 y.o. M) Primary Care Provider: [name] Other Clinician: Referring Provider: Treating Provider/Extender: [name] in Treatment: 64 Information Obtained From Patient Chart Constitutional Symptoms (General Health) Medical History: Past Medical History Notes: obesity Eyes Medical History: Positive for: Cataracts - right eye removed Negative for: Glaucoma; Optic Neuritis Ear/Nose/Mouth/Throat Medical History: Negative for: Chronic sinus problems/congestion; Middle ear problems Past Medical History Notes: hard of hearing Hematologic/Lymphatic Medical History: Positive for: Anemia - macrocytic; Lymphedema Respiratory Medical History: Past Medical History Notes: frozen diaphragm right Cardiovascular ALEC, JAROS ([id number]) [id number] Page 15 of 16 Medical History: Positive for: Congestive Heart Failure; Deep Vein Thrombosis - left leg; Hypertension; Peripheral Venous Disease Past Medical History Notes: hyperlipidemia Endocrine Medical  History: Negative for: Type I Diabetes; Type II Diabetes Genitourinary Medical History: Negative for: End Stage Renal Disease Integumentary (Skin) Medical History: Negative for: History of Burn Musculoskeletal Medical History: Past Medical History Notes: myositis, lumbar DDD, spinal stenosis, cervical facet joint syndrome Oncologic Medical History: Negative for: Received Chemotherapy; Received Radiation Psychiatric Medical History: Negative for: Anorexia/bulimia; Confinement Anxiety HBO Extended History Items Eyes: Cataracts Immunizations Pneumococcal Vaccine: Received Pneumococcal Vaccination: Yes Received Pneumococcal Vaccination On or After 60th Birthday: Yes Implantable Devices No devices added Hospitalization / Surgery History Type of Hospitalization/Surgery cervical fusion lumbar surgery coronary stent placement lasik eye surgery hydrocele excision Family and Social History Cancer: Yes - Mother; Diabetes: No; Heart Disease: Yes - Father,Mother; Hereditary Spherocytosis: No; Hypertension: Yes - Father,Siblings; Kidney Disease: No; Lung Disease: Yes - Siblings; Seizures: No; Stroke: No; Thyroid Problems: No; Tuberculosis: No; Former smoker - quit 1991; Marital Status - Married; Alcohol Use: Moderate; Drug Use: No History; Caffeine Use: Daily - coffee; Financial Concerns: No; Food, Clothing or Shelter Needs: No; Support System Lacking: No; Transportation Concerns: No Electronic Signature(s) Signed: 05/06/2023 12:03:38 PM By: Duanne Guess MD FACS Entered By: Duanne Guess on 05/06/2023 08:54:29 [name] ([id number]) [id number] Page 16 of 16 -------------------------------------------------------------------------------- SuperBill Details Patient Name: Date of Service: AVYN, COATE 05/06/2023 Medical Record Number: [id number] Patient Account Number: [id number] Date of Birth/Sex: Treating RN: [birth year] (72 y.o. 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 Lymphedema, not elsewhere classified I87.2 Venous insufficiency (chronic) (peripheral) Z86.718 Personal history of other venous thrombosis and embolism I10 Essential (primary) hypertension I25.10 Atherosclerotic heart disease of native coronary artery without angina pectoris Facility Procedures : CPT4 Code: 70350093 Description: 97597 - DEBRIDE WOUND 1ST 20 SQ CM OR < ICD-10 Diagnosis Description 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ed Modifier: Quantity: 1 : CPT4 Code: 81829937 Description: (Facility Use Only) 29581LT - APPLY MULTLAY COMPRS LWR LT LEG Modifier: 59 Quantity: 1 Physician Procedures : CPT4 Code Description Modifier [id number] - WC PHYS LEVEL 4 - EST PT 25 ICD-10 Diagnosis Description 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ed L97.822 Non-pressure chronic ulcer of other part of left  lower leg  with fat layer exposed 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

## 2023-05-06 NOTE — Progress Notes (Signed)
COSTANTINO, [name] ([id number]) [id number] Page 1 of 11 Visit Report for 05/06/2023 Arrival Information Details Patient Name: Date of Service: [name], [name] 05/06/2023 11:00 A M Medical Record Number: [id number] Patient Account Number: [id number] Date of Birth/Sex: Treating RN: 1950-08-14 (73 y.o. [name] Primary Care Kendell Sagraves: [name] Other Clinician: Referring Delsie Amador: Treating Kamori Kitchens/Extender: [name] in Treatment: 82 Visit Information History Since Last Visit Added or deleted any medications: No Patient Arrived: Wheel Chair Any new allergies or adverse reactions: No Arrival Time: 10:53 Had a fall or experienced change in No Accompanied By: self activities of daily living that may affect Transfer Assistance: None risk of falls: Patient Identification Verified: Yes Signs or symptoms of abuse/neglect since last visito No Secondary Verification Process Completed: Yes Hospitalized since last visit: No Patient Requires Transmission-Based Precautions: No Implantable device outside of the clinic excluding No Patient Has Alerts: Yes cellular tissue based products placed in the center Patient Alerts: R ABI= N/C, TBI= .75 since last visit: L ABI =N/C, TBI = .57 Has Dressing in Place as Prescribed: Yes Has Compression in Place as Prescribed: Yes Pain Present Now: No Electronic Signature(s) Signed: 05/06/2023 11:51:00 AM By: Zenaida Deed RN, BSN Entered By: Zenaida Deed on 05/06/2023 07:54:09 -------------------------------------------------------------------------------- Compression Therapy Details Patient Name: Date of Service: [name] 05/06/2023 11:00 A M Medical Record Number: [id number] Patient Account Number: [id number] Date of Birth/Sex: Treating RN: [birth year] (72 y.o. [name] Primary Care Charnika Herbst: [name] Other Clinician: Referring Alazae Crymes: Treating Marciano Mundt/Extender:  [name] in Treatment: 64 Compression Therapy Performed for Wound Assessment: Wound #10 Left,Anterior Lower Leg Performed By: Clinician Zenaida Deed, RN Compression Type: Three Layer Post Procedure Diagnosis Same as Pre-procedure Electronic Signature(s) Signed: 05/06/2023 11:51:00 AM By: Zenaida Deed RN, BSN Entered By: Zenaida Deed on 05/06/2023 08:65:78 [name] ([id number]) [id number] Page 2 of 11 -------------------------------------------------------------------------------- Compression Therapy Details Patient Name: Date of Service: [name], [name] 05/06/2023 11:00 A M Medical Record Number: [id number] Patient Account Number: [id number] Date of Birth/Sex: Treating RN: [birth year] (72 y.o. [name] Primary Care Emileo Semel: [name] Other Clinician: Referring Hall Birchard: Treating Kadyn Guild/Extender: [name] in Treatment: 18 Compression Therapy Performed for Wound Assessment: Wound #3 Right,Medial Lower Leg Performed By: Clinician Zenaida Deed, RN Compression Type: Three Layer Post Procedure Diagnosis Same as Pre-procedure Electronic Signature(s) Signed: 05/06/2023 11:51:00 AM By: Zenaida Deed RN, BSN Entered By: Zenaida Deed on 05/06/2023 08:21:25 -------------------------------------------------------------------------------- Encounter Discharge Information Details Patient Name: Date of Service: [name] 05/06/2023 11:00 A M Medical Record Number: [id number] Patient Account Number: [id number] Date of Birth/Sex: Treating RN: 10-05-1950 (72 y.o. [name] Primary Care Nayanna Seaborn: [name] Other Clinician: Referring Armenta Erskin: Treating Dane Bloch/Extender: [name] in Treatment: 62 Encounter Discharge Information Items Post Procedure Vitals Discharge Condition: Stable Temperature (F): 97.8 Ambulatory Status:  Wheelchair Pulse (bpm): 61 Discharge Destination: Home Respiratory Rate (breaths/min): 20 Transportation: Private Auto Blood Pressure (mmHg): 151/80 Accompanied By: self Schedule Follow-up Appointment: Yes Clinical Summary of Care: Patient Declined Electronic Signature(s) Signed: 05/06/2023 11:51:00 AM By: Zenaida Deed RN, BSN Entered By: Zenaida Deed on 05/06/2023 08:50:38 -------------------------------------------------------------------------------- Lower Extremity Assessment Details Patient Name: Date of Service: [name], [name] 05/06/2023 11:00 A M Medical Record Number: [id number] Patient Account Number: [id number] Date of Birth/Sex: Treating RN: Sep 25, 1950 (72 y.o. [name] Primary Care Rheba Diamond: [name] Other Clinician: Referring Lilac Hoff: Treating Anella Nakata/Extender: [name] in Treatment: 64 Edema Assessment Assessed: [  Left: No] [Right: No] Edema: [Left: Yes] [Right: Yes] Calf HJALMER, IOVINO ([id number]) [id number] Page 3 of 11 Left: Right: Point of Measurement: From Medial Instep 40.5 cm 43 cm Ankle Left: Right: Point of Measurement: From Medial Instep 26.5 cm 27 cm Vascular Assessment Pulses: Dorsalis Pedis Palpable: [Left:Yes] [Right:Yes] Extremity colors, hair growth, and conditions: Extremity Color: [Left:Hyperpigmented] [Right:Hyperpigmented] Hair Growth on Extremity: [Left:Yes] [Right:Yes] Temperature of Extremity: [Left:Warm] [Right:Warm] Capillary Refill: [Left:< 3 seconds] [Right:< 3 seconds] Dependent Rubor: [Left:No No] [Right:No No] Electronic Signature(s) Signed: 05/06/2023 11:51:00 AM By: Zenaida Deed RN, BSN Entered By: Zenaida Deed on 05/06/2023 08:00:36 -------------------------------------------------------------------------------- Multi Wound Chart Details Patient Name: Date of Service: [name] 05/06/2023 11:00 A M Medical Record Number:  [id number] Patient Account Number: [id number] Date of Birth/Sex: Treating RN: Nov 08, 1950 (72 y.o. M) Primary Care Lonetta Blassingame: [name] Other Clinician: Referring Analy Bassford: Treating Adonnis Salceda/Extender: [name] in Treatment: 70 Vital Signs Height(in): 71 Pulse(bpm): 61 Weight(lbs): 267 Blood Pressure(mmHg): 151/80 Body Mass Index(BMI): 37.2 Temperature(F): 97.8 Respiratory Rate(breaths/min): 20 [10:Photos:] Left, Anterior Lower Leg Right, Medial Lower Leg Right, Anterior Lower Leg Wound Location: Blister Gradually Appeared Gradually Appeared Wounding Event: Lymphedema Lymphedema Calcinosis Primary Etiology: Cataracts, Anemia, Lymphedema, Cataracts, Anemia, Lymphedema, Cataracts, Anemia, Lymphedema, Comorbid History: Congestive Heart Failure, Deep Vein Congestive Heart Failure, Deep Vein Congestive Heart Failure, Deep Vein Thrombosis, Hypertension, Peripheral Thrombosis, Hypertension, Peripheral Thrombosis, Hypertension, Peripheral Venous Disease Venous Disease Venous Disease 04/26/2023 12/07/2021 05/12/2022 Date Acquired: 1 64 51 Weeks of Treatment: Open Open Open Wound Status: No No No Wound Recurrence: No No Yes Clustered Wound: N/A N/A 2 Clustered Quantity: 2x0.7x0.1 7.6x5.7x0.4 0.4x0.3x0.3 Measurements L x W x D (cm) 1.1 34.023 0.094 A (cm) : rea 0.11 13.609 0.028 Volume (cm) : [name] ([id number]) [id number] Page 4 of 11 94.30% -537.00% 60.20% % Reduction in Area: 94.30% -537.10% -16.70% % Reduction in Volume: Full Thickness Without Exposed Full Thickness With Exposed Support Full Thickness Without Exposed Classification: Support Structures Structures Support Structures Medium Medium Small Exudate A mount: Serosanguineous Purulent Serous Exudate Type: red, brown yellow, brown, green amber Exudate Color: Flat and Intact Flat and Intact Epibole Wound Margin: Large (67-100%) Small (1-33%) None  Present (0%) Granulation A mount: Red Red N/A Granulation Quality: Small (1-33%) Large (67-100%) None Present (0%) Necrotic A mount: Fat Layer (Subcutaneous Tissue): Yes Fat Layer (Subcutaneous Tissue): Yes Fat Layer (Subcutaneous Tissue): Yes Exposed Structures: Fascia: No Fascia: No Tendon: No Tendon: No Muscle: No Muscle: No Joint: No Joint: No Bone: No Bone: No Medium (34-66%) Medium (34-66%) Large (67-100%) Epithelialization: N/A Debridement - Selective/Open Wound N/A Debridement: Pre-procedure Verification/Time Out N/A 11:20 N/A Taken: N/A Lidocaine 4% Topical Solution N/A Pain Control: N/A Slough N/A Tissue Debrided: N/A Non-Viable Tissue N/A Level: N/A 13.6 N/A Debridement A (sq cm): rea N/A Curette N/A Instrument: N/A Minimum N/A Bleeding: N/A Pressure N/A Hemostasis A chieved: N/A 3 N/A Procedural Pain: N/A 2 N/A Post Procedural Pain: N/A Procedure was tolerated well N/A Debridement Treatment Response: N/A 7.6x5.7x0.4 N/A Post Debridement Measurements L x W x D (cm) N/A 13.609 N/A Post Debridement Volume: (cm) No Abnormalities Noted Excoriation: No No Abnormalities Noted Periwound Skin Texture: Maceration: No Maceration: Yes No Abnormalities Noted Periwound Skin Moisture: Dry/Scaly: No Hemosiderin Staining: Yes Hemosiderin Staining: Yes Hemosiderin Staining: Yes Periwound Skin Color: Atrophie Blanche: No Rubor: Yes Cyanosis: No Erythema: No Ecchymosis: No Erythema: No No Abnormality No Abnormality No Abnormality Temperature: N/A Yes N/A Tenderness on Palpation: N/A N/A probes to calcium deposit Assessment  Notes: Compression Therapy Compression Therapy N/A Procedures Performed: Debridement Treatment Notes Electronic Signature(s) Signed: 05/06/2023 11:47:03 AM By: Duanne Guess MD FACS Entered By: Duanne Guess on 05/06/2023  08:47:03 -------------------------------------------------------------------------------- Multi-Disciplinary Care Plan Details Patient Name: Date of Service: AXZEL, ROCKHILL 05/06/2023 11:00 A M Medical Record Number: [id number] Patient Account Number: [id number] Date of Birth/Sex: Treating RN: [birth year] (72 y.o. [name] Primary Care Koston Hennes: [name] Other Clinician: Referring Julitza Rickles: Treating Gil Ingwersen/Extender: [name] in Treatment: 81 Multidisciplinary Care Plan reviewed with physician Active Inactive Venous Leg Ulcer Nursing Diagnoses: Knowledge deficit related to disease process and management DREUX, MCGROARTY ([id number]) [id number] Page 5 of 11 Potential for venous Insuffiency (use before diagnosis confirmed) Goals: Patient will maintain optimal edema control Date Initiated: 02/17/2022 Target Resolution Date: 05/20/2023 Goal Status: Active Interventions: Assess peripheral edema status every visit. Compression as ordered Provide education on venous insufficiency Treatment Activities: Non-invasive vascular studies : 02/17/2022 Therapeutic compression applied : 02/17/2022 Notes: Wound/Skin Impairment Nursing Diagnoses: Impaired tissue integrity Knowledge deficit related to ulceration/compromised skin integrity Goals: Patient/caregiver will verbalize understanding of skin care regimen Date Initiated: 02/17/2022 Target Resolution Date: 05/20/2023 Goal Status: Active Ulcer/skin breakdown will have a volume reduction of 30% by week 4 Date Initiated: 02/17/2022 Date Inactivated: 03/10/2022 Target Resolution Date: 03/10/2022 Unmet Reason: infection being treated, Goal Status: Unmet waiting on derm appte Ulcer/skin breakdown will have a volume reduction of 50% by week 8 Date Initiated: 03/10/2022 Date Inactivated: 04/14/2022 Target Resolution Date: 04/07/2022 Goal Status: Unmet Unmet Reason:  calcinosis Ulcer/skin breakdown will have a volume reduction of 80% by week 12 Date Initiated: 04/14/2022 Date Inactivated: 05/05/2022 Target Resolution Date: 05/05/2022 Unmet Reason: infection, chronic Goal Status: Unmet condition Interventions: Assess patient/caregiver ability to obtain necessary supplies Assess patient/caregiver ability to perform ulcer/skin care regimen upon admission and as needed Assess ulceration(s) every visit Provide education on ulcer and skin care Treatment Activities: Skin care regimen initiated : 02/17/2022 Topical wound management initiated : 02/17/2022 Notes: Electronic Signature(s) Signed: 05/06/2023 11:51:00 AM By: Zenaida Deed RN, BSN Entered By: Zenaida Deed on 05/06/2023 07:39:04 -------------------------------------------------------------------------------- Pain Assessment Details Patient Name: Date of Service: [name] 05/06/2023 11:00 A M Medical Record Number: [id number] Patient Account Number: [id number] Date of Birth/Sex: Treating RN: 23-Apr-1951 (72 y.o. [name] Primary Care [place]en Manor: [name] Other Clinician: Referring Tandy Grawe: Treating Elizibeth Breau/Extender: [name] in Treatment: 36 Active Problems Location of Pain Severity and Description of Pain KYEL, PURK ([id number]) [id number] Page 6 of 11 Patient Has Paino No Site Locations Rate the pain. Current Pain Level: 0 Pain Management and Medication Current Pain Management: Electronic Signature(s) Signed: 05/06/2023 11:51:00 AM By: Zenaida Deed RN, BSN Entered By: Zenaida Deed on 05/06/2023 07:54:36 -------------------------------------------------------------------------------- Patient/Caregiver Education Details Patient Name: Date of Service: Ellenbecker, [name] 10/11/2024andnbsp11:00 A M Medical Record Number: [id number] Patient Account Number: [id number] Date of Birth/Gender: Treating  RN: 1951-03-03 (72 y.o. [name] Primary Care Physician: [name] Other Clinician: Referring Physician: Treating Physician/Extender: [name] in Treatment: 45 Education Assessment Education Provided To: Patient Education Topics Provided Infection: Methods: Explain/Verbal Responses: Reinforcements needed, State content correctly Venous: Methods: Explain/Verbal Responses: Reinforcements needed, State content correctly Wound/Skin Impairment: Methods: Explain/Verbal Responses: Reinforcements needed, State content correctly Electronic Signature(s) Signed: 05/06/2023 11:51:00 AM By: Zenaida Deed RN, BSN Entered By: Zenaida Deed on 05/06/2023 07:40:05 [name] ([id number]) [id number] Page 7 of 11 -------------------------------------------------------------------------------- Wound Assessment Details Patient Name: Date of Service: RIGGINS, CISEK 05/06/2023 11:00 A  M Medical Record Number: [id number] Patient Account Number: [id number] Date of Birth/Sex: Treating RN: 12-11-1950 (72 y.o. [name] Primary Care Hebah Bogosian: [name] Other Clinician: Referring Isobella Ascher: Treating Darrel Baroni/Extender: [name] in Treatment: 64 Wound Status Wound Number: 10 Primary Lymphedema Etiology: Wound Location: Left, Anterior Lower Leg Wound Open Wounding Event: Blister Status: Date Acquired: 04/26/2023 Comorbid Cataracts, Anemia, Lymphedema, Congestive Heart Failure, Deep Weeks Of Treatment: 1 History: Vein Thrombosis, Hypertension, Peripheral Venous Disease Clustered Wound: No Photos Wound Measurements Length: (cm) Width: (cm) Depth: (cm) Area: (cm) Volume: (cm) 2 % Reduction in Area: 94.3% 0.7 % Reduction in Volume: 94.3% 0.1 Epithelialization: Medium (34-66%) 1.1 Tunneling: No 0.11 Undermining: No Wound Description Classification: Full Thickness Without  Exposed Suppor Wound Margin: Flat and Intact Exudate Amount: Medium Exudate Type: Serosanguineous Exudate Color: red, brown t Structures Foul Odor After Cleansing: No Slough/Fibrino Yes Wound Bed Granulation Amount: Large (67-100%) Exposed Structure Granulation Quality: Red Fat Layer (Subcutaneous Tissue) Exposed: Yes Necrotic Amount: Small (1-33%) Necrotic Quality: Adherent Slough Periwound Skin Texture Texture Color No Abnormalities Noted: Yes No Abnormalities Noted: No Atrophie Blanche: No Moisture Cyanosis: No No Abnormalities Noted: Yes Ecchymosis: No Erythema: No Hemosiderin Staining: Yes Temperature / Pain Temperature: No Abnormality Treatment Notes Wound #10 (Lower Leg) Wound Laterality: Left, Anterior Cleanser Peri-Wound Care Sween Lotion (Moisturizing lotion) NEKHI, LIWANAG ([id number]) [id number] Page 8 of 11 Discharge Instruction: Apply moisturizing lotion as directed Topical Primary Dressing Maxorb Extra Ag+ Alginate Dressing, 2x2 (in/in) Discharge Instruction: Apply to wound bed as instructed Secondary Dressing ABD Pad, 8x10 Discharge Instruction: Apply over primary dressing as directed. Secured With Compression Wrap ThreePress (3 layer compression wrap) Discharge Instruction: Apply three layer compression as directed. Compression Stockings Add-Ons Electronic Signature(s) Signed: 05/06/2023 11:51:00 AM By: Zenaida Deed RN, BSN Entered By: Zenaida Deed on 05/06/2023 08:13:42 -------------------------------------------------------------------------------- Wound Assessment Details Patient Name: Date of Service: NIKOLIS, BERENT [name] 05/06/2023 11:00 A M Medical Record Number: [id number] Patient Account Number: [id number] Date of Birth/Sex: Treating RN: 1950-12-28 (72 y.o. [name] Primary Care Abbigayle Toole: [name] Other Clinician: Referring Ashleyann Shoun: Treating Teanna Elem/Extender: [name] in Treatment: 64 Wound Status Wound Number: 3 Primary Lymphedema Etiology: Wound Location: Right, Medial Lower Leg Wound Open Wounding Event: Gradually Appeared Status: Date Acquired: 12/07/2021 Comorbid Cataracts, Anemia, Lymphedema, Congestive Heart Failure, Deep Weeks Of Treatment: 64 History: Vein Thrombosis, Hypertension, Peripheral Venous Disease Clustered Wound: No Photos Wound Measurements Length: (cm) 7.6 Width: (cm) 5.7 Depth: (cm) 0.4 Area: (cm) 34.023 Volume: (cm) 13.609 % Reduction in Area: -537% % Reduction in Volume: -537.1% Epithelialization: Medium (34-66%) Tunneling: No Undermining: No Wound Description Classification: Full Thickness With Exposed Support Structures Wound Margin: Flat and Intact Leiter, Eagan ([id number]) Exudate Amount: Medium Exudate Type: Purulent Exudate Color: yellow, brown, green Foul Odor After Cleansing: No Slough/Fibrino Yes [id number] Page 9 of 11 Wound Bed Granulation Amount: Small (1-33%) Exposed Structure Granulation Quality: Red Fascia Exposed: No Necrotic Amount: Large (67-100%) Fat Layer (Subcutaneous Tissue) Exposed: Yes Necrotic Quality: Adherent Slough Tendon Exposed: No Muscle Exposed: No Joint Exposed: No Bone Exposed: No Periwound Skin Texture Texture Color No Abnormalities Noted: Yes No Abnormalities Noted: No Erythema: No Moisture Hemosiderin Staining: Yes No Abnormalities Noted: Yes Rubor: Yes Temperature / Pain Temperature: No Abnormality Tenderness on Palpation: Yes Treatment Notes Wound #3 (Lower Leg) Wound Laterality: Right, Medial Cleanser Peri-Wound Care Skin Prep Discharge Instruction: cavelon to macerated wound margins Sween Lotion (Moisturizing lotion) Discharge Instruction: Apply moisturizing lotion as directed Topical Primary  Dressing Maxorb Extra Ag+ Alginate Dressing, 4x4.75 (in/in) Discharge Instruction: Apply to excoriated weeping  areas Secondary Dressing Zetuvit Plus 4x8 in Discharge Instruction: Apply over primary dressing as directed. Secured With Compression Wrap ThreePress (3 layer compression wrap) Discharge Instruction: Apply three layer compression as directed. Compression Stockings Add-Ons Electronic Signature(s) Signed: 05/06/2023 11:51:00 AM By: Zenaida Deed RN, BSN Entered By: Zenaida Deed on 05/06/2023 08:14:37 -------------------------------------------------------------------------------- Wound Assessment Details Patient Name: Date of Service: TITUS, DRONE [name] 05/06/2023 11:00 A M Medical Record Number: [id number] Patient Account Number: [id number] Date of Birth/Sex: Treating RN: 29-Nov-1950 (72 y.o. [name] Primary Care Ruhee Enck: [name] Other Clinician: Referring Nohealani Medinger: Treating Omarius Grantham/Extender: [name] in Treatment: [address], [place] ([id number]) [id number] Page 10 of 11 Wound Status Wound Number: 6 Primary Calcinosis Etiology: Wound Location: Right, Anterior Lower Leg Wound Open Wounding Event: Gradually Appeared Status: Date Acquired: 05/12/2022 Comorbid Cataracts, Anemia, Lymphedema, Congestive Heart Failure, Deep Weeks Of Treatment: 51 History: Vein Thrombosis, Hypertension, Peripheral Venous Disease Clustered Wound: Yes Photos Wound Measurements Length: (cm) Width: (cm) Depth: (cm) Clustered Quantity: Area: (cm) Volume: (cm) 0.4 % Reduction in Area: 60.2% 0.3 % Reduction in Volume: -16.7% 0.3 Epithelialization: Large (67-100%) 2 Tunneling: No 0.094 Undermining: No 0.028 Wound Description Classification: Full Thickness Without Exposed Sup Wound Margin: Epibole Exudate Amount: Small Exudate Type: Serous Exudate Color: amber port Structures Foul Odor After Cleansing: No Slough/Fibrino No Wound Bed Granulation Amount: None Present (0%) Exposed Structure Necrotic Amount: None Present  (0%) Fascia Exposed: No Fat Layer (Subcutaneous Tissue) Exposed: Yes Tendon Exposed: No Muscle Exposed: No Joint Exposed: No Bone Exposed: No Periwound Skin Texture Texture Color No Abnormalities Noted: Yes No Abnormalities Noted: No Hemosiderin Staining: Yes Moisture No Abnormalities Noted: Yes Temperature / Pain Temperature: No Abnormality Assessment Notes probes to calcium deposit Treatment Notes Wound #6 (Lower Leg) Wound Laterality: Right, Anterior Cleanser Peri-Wound Care Sween Lotion (Moisturizing lotion) Discharge Instruction: Apply moisturizing lotion as directed Topical Primary Dressing Maxorb Extra Ag+ Alginate Dressing, 2x2 (in/in) Discharge Instruction: Apply to wound bed as instructed Secondary Dressing OLMAN, YONO ([id number]) [id number] Page 11 of 11 ABD Pad, 8x10 Discharge Instruction: Apply over primary dressing as directed. Secured With Compression Wrap ThreePress (3 layer compression wrap) Discharge Instruction: Apply three layer compression as directed. Compression Stockings Add-Ons Electronic Signature(s) Signed: 05/06/2023 11:51:00 AM By: Zenaida Deed RN, BSN Entered By: Zenaida Deed on 05/06/2023 08:15:26 -------------------------------------------------------------------------------- Vitals Details Patient Name: Date of Service: [name] 05/06/2023 11:00 A M Medical Record Number: [id number] Patient Account Number: [id number] Date of Birth/Sex: Treating RN: April 10, 1951 (72 y.o. [name] Primary Care Kathrina Crosley: [name] Other Clinician: Referring Kylinn Shropshire: Treating Zac Torti/Extender: [name] in Treatment: 56 Vital Signs Time Taken: 10:54 Temperature (F): 97.8 Height (in): 71 Pulse (bpm): 61 Weight (lbs): 267 Respiratory Rate (breaths/min): 20 Body Mass Index (BMI): 37.2 Blood Pressure (mmHg): 151/80 Reference Range: 80 - 120 mg / dl Electronic  Signature(s) Signed: 05/06/2023 11:51:00 AM By: Zenaida Deed RN, BSN Entered By: Zenaida Deed on 05/06/2023 07:54:29

## 2023-05-08 ENCOUNTER — Encounter: Payer: Self-pay | Admitting: Internal Medicine

## 2023-05-08 DIAGNOSIS — E785 Hyperlipidemia, unspecified: Secondary | ICD-10-CM

## 2023-05-08 DIAGNOSIS — I10 Essential (primary) hypertension: Secondary | ICD-10-CM

## 2023-05-08 DIAGNOSIS — E782 Mixed hyperlipidemia: Secondary | ICD-10-CM

## 2023-05-08 DIAGNOSIS — G729 Myopathy, unspecified: Secondary | ICD-10-CM

## 2023-05-09 MED ORDER — ATENOLOL 25 MG PO TABS
25.0000 mg | ORAL_TABLET | Freq: Two times a day (BID) | ORAL | 3 refills | Status: DC
  Filled 2024-02-02: qty 180, 90d supply, fill #0

## 2023-05-10 ENCOUNTER — Ambulatory Visit (HOSPITAL_BASED_OUTPATIENT_CLINIC_OR_DEPARTMENT_OTHER): Payer: Medicare Other | Admitting: General Surgery

## 2023-05-10 ENCOUNTER — Other Ambulatory Visit: Payer: Self-pay

## 2023-05-10 ENCOUNTER — Telehealth: Payer: Self-pay

## 2023-05-10 DIAGNOSIS — Z79899 Other long term (current) drug therapy: Secondary | ICD-10-CM

## 2023-05-10 DIAGNOSIS — D696 Thrombocytopenia, unspecified: Secondary | ICD-10-CM

## 2023-05-10 DIAGNOSIS — I2510 Atherosclerotic heart disease of native coronary artery without angina pectoris: Secondary | ICD-10-CM

## 2023-05-10 DIAGNOSIS — I10 Essential (primary) hypertension: Secondary | ICD-10-CM

## 2023-05-10 DIAGNOSIS — Z0000 Encounter for general adult medical examination without abnormal findings: Secondary | ICD-10-CM

## 2023-05-10 DIAGNOSIS — E782 Mixed hyperlipidemia: Secondary | ICD-10-CM

## 2023-05-10 MED ORDER — FUROSEMIDE 40 MG PO TABS: 40.0000 mg | ORAL_TABLET | ORAL | Status: DC

## 2023-05-10 NOTE — Telephone Encounter (Signed)
-----   Message from Glenaire sent at 05/09/2023  9:16 PM EDT ----- Hgb is stable 12.8 Platelets are a little low   92,000  Will follow  CHeck in 1 month Electrolytes and kidney function are OK FLuid is up  How often is he taking lasix?   Could take every other day if not taking  Lipids are excellent   Recomm   Follow up BMET and BNP and CBC in 1 month

## 2023-05-10 NOTE — Telephone Encounter (Signed)
No issue with levaquin and Eliquis.

## 2023-05-10 NOTE — Telephone Encounter (Signed)
Pt advised.

## 2023-05-10 NOTE — Telephone Encounter (Signed)
Pt says he has been on Levaquin for about 6 weeks and has 2 more weeks to go for a deep wound infection on his lower extremity... he is asking if it has any issues since he is on Eliquis.Marland KitchenMarland Kitchen I will froward to the PharmD for any possible considerations but I advised him that he should continue the plan for his infection unless otherwise advised to stop.

## 2023-05-11 ENCOUNTER — Other Ambulatory Visit: Payer: Self-pay | Admitting: Internal Medicine

## 2023-05-13 ENCOUNTER — Encounter (HOSPITAL_BASED_OUTPATIENT_CLINIC_OR_DEPARTMENT_OTHER): Payer: Medicare Other | Admitting: General Surgery

## 2023-05-13 DIAGNOSIS — I890 Lymphedema, not elsewhere classified: Secondary | ICD-10-CM | POA: Diagnosis not present

## 2023-05-13 DIAGNOSIS — D649 Anemia, unspecified: Secondary | ICD-10-CM | POA: Diagnosis not present

## 2023-05-13 DIAGNOSIS — L97812 Non-pressure chronic ulcer of other part of right lower leg with fat layer exposed: Secondary | ICD-10-CM | POA: Diagnosis not present

## 2023-05-13 DIAGNOSIS — I110 Hypertensive heart disease with heart failure: Secondary | ICD-10-CM | POA: Diagnosis not present

## 2023-05-13 DIAGNOSIS — L97822 Non-pressure chronic ulcer of other part of left lower leg with fat layer exposed: Secondary | ICD-10-CM | POA: Diagnosis not present

## 2023-05-13 DIAGNOSIS — I509 Heart failure, unspecified: Secondary | ICD-10-CM | POA: Diagnosis not present

## 2023-05-13 DIAGNOSIS — I739 Peripheral vascular disease, unspecified: Secondary | ICD-10-CM | POA: Diagnosis not present

## 2023-05-13 NOTE — Progress Notes (Signed)
[name], [name] ([id number]) [id number] Page 1 of 11 Visit Report for 05/13/2023 Arrival Information Details Patient Name: Date of Service: [name], [name] 05/13/2023 11:00 A M Medical Record Number: [id number] Patient Account Number: [id number] Date of Birth/Sex: Treating RN: [birth year] [name] (72 y.o. M) Primary Care [name]: [name] Other Clinician: Referring [name]: Treating [name]/Extender: [name] in Treatment: 74 Visit Information History Since Last Visit Added or deleted any medications: No Patient Arrived: Wheel Chair Any new allergies or adverse reactions: No Arrival Time: 10:59 Had a fall or experienced change in No Accompanied By: self activities of daily living that may affect Transfer Assistance: None risk of falls: Patient Identification Verified: Yes Signs or symptoms of abuse/neglect since last visito No Secondary Verification Process Completed: Yes Hospitalized since last visit: No Patient Requires Transmission-Based Precautions: No Implantable device outside of the clinic excluding No Patient Has Alerts: Yes cellular tissue based products placed in the center Patient Alerts: R ABI= N/C, TBI= .75 since last visit: L ABI =N/C, TBI = .57 Has Dressing in Place as Prescribed: Yes Has Compression in Place as Prescribed: Yes Pain Present Now: No Electronic Signature(s) Signed: 05/13/2023 12:37:09 PM By: [name] Deed RN, BSN Entered By: [name] on 05/13/2023 08:06:01 -------------------------------------------------------------------------------- Compression Therapy Details Patient Name: Date of Service: [name] Nakayama [name] 05/13/2023 11:00 A M Medical Record Number: [id number] Patient Account Number: [id number] Date of Birth/Sex: Treating RN: [name] (72 y.o. [name] Primary Care [name]: [name] Other Clinician: Referring [name]: Treating [name]/Extender: [name] in Treatment: 39 Compression Therapy Performed for Wound Assessment: Wound #3 Right,Medial Lower Leg Performed By: Clinician [name] Deed, RN Compression Type: Three Layer Post Procedure Diagnosis Same as Pre-procedure Electronic Signature(s) Signed: 05/13/2023 12:37:09 PM By: [name] Deed RN, BSN Entered By: [name] on 05/13/2023 08:25:36 [name] [name] ([id number]) [id number] Page 2 of 11 -------------------------------------------------------------------------------- Encounter Discharge Information Details Patient Name: Date of Service: [name], [name] 05/13/2023 11:00 A M Medical Record Number: [id number] Patient Account Number: [id number] Date of Birth/Sex: Treating RN: [name] (72 y.o. [name] Primary Care [name]: [name] Other Clinician: Referring Zeniyah Peaster: Treating [name] in Treatment: 35 Encounter Discharge Information Items Post Procedure Vitals Discharge Condition: Stable Temperature (F): 97.9 Ambulatory Status: Wheelchair Pulse (bpm): 69 Discharge Destination: Home Respiratory Rate (breaths/min): 18 Transportation: Private Auto Blood Pressure (mmHg): 139/68 Accompanied By: self Schedule Follow-up Appointment: Yes Clinical Summary of Care: Patient Declined Electronic Signature(s) Signed: 05/13/2023 12:37:09 PM By: [name] Deed RN, BSN Entered By: [name] on 05/13/2023 09:02:01 -------------------------------------------------------------------------------- Lower Extremity Assessment Details Patient Name: Date of Service: [name], [name] 05/13/2023 11:00 A M Medical Record Number: [id number] Patient Account Number: [id number] Date of Birth/Sex: Treating RN: [name] (72 y.o. [name] Primary Care Novalyn Lajara: [name] Other Clinician: Referring [name]: Treating [name]/Extender: [name] in Treatment: 65 Edema Assessment Assessed: [Left: No] [Right: No] Edema: [Left: Yes] [Right: Yes] Calf Left: Right: Point of Measurement: From Medial Instep 40.5 cm 48.5 cm Ankle Left: Right: Point of Measurement: From Medial Instep 25.5 cm 28 cm Vascular Assessment Pulses: Dorsalis Pedis Palpable: [Left:Yes] [Right:Yes] Extremity colors, hair growth, and conditions: Extremity Color: [Left:Hyperpigmented] [Right:Hyperpigmented] Hair Growth on Extremity: [Left:Yes] [Right:Yes] Temperature of Extremity: [Left:Warm] [Right:Warm] Capillary Refill: [Left:< 3 seconds] [Right:< 3 seconds] Dependent Rubor: [Left:No No] [Right:No No] Electronic Signature(s) Signed: 05/13/2023 12:37:09 PM By: [name] Deed RN, BSN Entered By: [name] on 05/13/2023 08:20:31 [name] [name] ([id number]) [id number] Page  3 of 11 -------------------------------------------------------------------------------- Multi Wound Chart Details Patient Name: Date of Service: [name], [name] 05/13/2023 11:00 A M Medical Record Number: [id number] Patient Account Number: [id number] Date of Birth/Sex: Treating RN: [birth year][name] (72 y.o. M) Primary Care [name]: [name] Other Clinician: Referring [name]: Treating [name]/Extender: [name] in Treatment: 33 Vital Signs Height(in): 71 Pulse(bpm): 69 Weight(lbs): 267 Blood Pressure(mmHg): 139/68 Body Mass Index(BMI): 37.2 Temperature(F): 97.9 Respiratory Rate(breaths/min): 18 [10:Photos:] Left, Anterior Lower Leg Right, Medial Lower Leg Right, Anterior Lower Leg Wound Location: Blister Gradually Appeared Gradually Appeared Wounding Event: Lymphedema Lymphedema Calcinosis Primary Etiology: Cataracts, Anemia, Lymphedema, Cataracts, Anemia, Lymphedema, Cataracts, Anemia, Lymphedema, Comorbid History: Congestive Heart Failure, Deep Vein Congestive Heart Failure, Deep  Vein Congestive Heart Failure, Deep Vein Thrombosis, Hypertension, Peripheral Thrombosis, Hypertension, Peripheral Thrombosis, Hypertension, Peripheral Venous Disease Venous Disease Venous Disease 04/26/2023 12/07/2021 05/12/2022 Date Acquired: 2 65 52 Weeks of Treatment: Healed - Epithelialized Open Open Wound Status: No No No Wound Recurrence: No No Yes Clustered Wound: N/A N/A 2 Clustered Quantity: 0x0x0 7.3x5.8x0.3 0.3x0.3x0.3 Measurements L x W x D (cm) 0 33.254 0.071 A (cm) : rea 0 9.976 0.021 Volume (cm) : 100.00% -522.60% 69.90% % Reduction in A rea: 100.00% -367.00% 12.50% % Reduction in Volume: Full Thickness Without Exposed Full Thickness With Exposed Support Full Thickness Without Exposed Classification: Support Structures Structures Support Structures None Present Medium Small Exudate A mount: N/A Serosanguineous Serous Exudate Type: N/A red, brown amber Exudate Color: N/A Flat and Intact Epibole Wound Margin: None Present (0%) Small (1-33%) None Present (0%) Granulation A mount: N/A Red N/A Granulation Quality: None Present (0%) Large (67-100%) None Present (0%) Necrotic A mount: N/A Eschar, Adherent Slough N/A Necrotic Tissue: Fascia: No Fat Layer (Subcutaneous Tissue): Yes Fat Layer (Subcutaneous Tissue): Yes Exposed Structures: Fat Layer (Subcutaneous Tissue): No Fascia: No Fascia: No Tendon: No Tendon: No Tendon: No Muscle: No Muscle: No Muscle: No Joint: No Joint: No Joint: No Bone: No Bone: No Bone: No Large (67-100%) Medium (34-66%) Large (67-100%) Epithelialization: N/A Debridement - Selective/Open Wound N/A Debridement: Pre-procedure Verification/Time Out N/A 11:35 N/A Taken: N/A Lidocaine 4% Topical Solution N/A Pain Control: N/A Necrotic/Eschar, Slough N/A Tissue Debrided: N/A Skin/Epidermis N/A Level: N/A 13.29 N/A Debridement A (sq cm): rea N/A Curette N/A Instrument: N/A Minimum N/A Bleeding: N/A Pressure  N/A Hemostasis A chieved: N/A 2 N/A Procedural Pain: N/A 0 N/A Post Procedural PainCALVON, [name] ([id number]) [id number] Page 4 of 11 N/A Procedure was tolerated well N/A Debridement Treatment Response: N/A 7.3x5.8x0.4 N/A Post Debridement Measurements L x W x D (cm) N/A 13.302 N/A Post Debridement Volume: (cm) No Abnormalities Noted Excoriation: No No Abnormalities Noted Periwound Skin Texture: Maceration: No Maceration: Yes No Abnormalities Noted Periwound Skin Moisture: Dry/Scaly: No Hemosiderin Staining: Yes Hemosiderin Staining: Yes Hemosiderin Staining: Yes Periwound Skin Color: Atrophie Blanche: No Rubor: Yes Cyanosis: No Erythema: No Ecchymosis: No Erythema: No No Abnormality No Abnormality No Abnormality Temperature: N/A Yes N/A Tenderness on Palpation: N/A N/A probes to calcium deposit Assessment Notes: N/A Compression Therapy N/A Procedures Performed: Debridement Treatment Notes Wound #3 (Lower Leg) Wound Laterality: Right, Medial Cleanser Peri-Wound Care Skin Prep Discharge Instruction: cavelon to macerated wound margins Sween Lotion (Moisturizing lotion) Discharge Instruction: Apply moisturizing lotion as directed Topical Primary Dressing Maxorb Extra Ag+ Alginate Dressing, 4x4.75 (in/in) Discharge Instruction: Apply to excoriated weeping areas Secondary Dressing ABD Pad, 8x10 Discharge Instruction: Apply over primary dressing as directed. Secured With Compression Wrap ThreePress (3 layer compression wrap) Discharge Instruction:  Apply three layer compression as directed. Compression Stockings Add-Ons Wound #6 (Lower Leg) Wound Laterality: Right, Anterior Cleanser Peri-Wound Care Sween Lotion (Moisturizing lotion) Discharge Instruction: Apply moisturizing lotion as directed Topical Primary Dressing Maxorb Extra Ag+ Alginate Dressing, 2x2 (in/in) Discharge Instruction: Apply to wound bed as instructed Secondary  Dressing ABD Pad, 8x10 Discharge Instruction: Apply over primary dressing as directed. Secured With Compression Wrap ThreePress (3 layer compression wrap) Discharge Instruction: Apply three layer compression as directed. Compression Stockings Add-Ons [name], [name] ([id number]) [id number] Page 5 of 11 Electronic Signature(s) Signed: 05/13/2023 12:08:54 PM By: Duanne Guess MD FACS Entered By: Duanne Guess on 05/13/2023 09:08:54 -------------------------------------------------------------------------------- Multi-Disciplinary Care Plan Details Patient Name: Date of Service: [name], [name] 05/13/2023 11:00 A M Medical Record Number: [id number] Patient Account Number: [id number] Date of Birth/Sex: Treating RN: [name] (72 y.o. [name] Primary Care Donasia Wimes: [name] Other Clinician: Referring Cristofer Yaffe: Treating Joyceline Maiorino/Extender: [name] in Treatment: 59 Multidisciplinary Care Plan reviewed with physician Active Inactive Venous Leg Ulcer Nursing Diagnoses: Knowledge deficit related to disease process and management Potential for venous Insuffiency (use before diagnosis confirmed) Goals: Patient will maintain optimal edema control Date Initiated: 02/17/2022 Target Resolution Date: 05/20/2023 Goal Status: Active Interventions: Assess peripheral edema status every visit. Compression as ordered Provide education on venous insufficiency Treatment Activities: Non-invasive vascular studies : 02/17/2022 Therapeutic compression applied : 02/17/2022 Notes: Wound/Skin Impairment Nursing Diagnoses: Impaired tissue integrity Knowledge deficit related to ulceration/compromised skin integrity Goals: Patient/caregiver will verbalize understanding of skin care regimen Date Initiated: 02/17/2022 Target Resolution Date: 05/20/2023 Goal Status: Active Ulcer/skin breakdown will have a volume reduction of 30% by  week 4 Date Initiated: 02/17/2022 Date Inactivated: 03/10/2022 Target Resolution Date: 03/10/2022 Unmet Reason: infection being treated, Goal Status: Unmet waiting on derm appte Ulcer/skin breakdown will have a volume reduction of 50% by week 8 Date Initiated: 03/10/2022 Date Inactivated: 04/14/2022 Target Resolution Date: 04/07/2022 Goal Status: Unmet Unmet Reason: calcinosis Ulcer/skin breakdown will have a volume reduction of 80% by week 12 Date Initiated: 04/14/2022 Date Inactivated: 05/05/2022 Target Resolution Date: 05/05/2022 Unmet Reason: infection, chronic Goal Status: Unmet condition Interventions: Assess patient/caregiver ability to obtain necessary supplies Assess patient/caregiver ability to perform ulcer/skin care regimen upon admission and as needed Assess ulceration(s) every visit Provide education on ulcer and skin care [name], [name] ([id number]) [id number] Page 6 of 11 Treatment Activities: Skin care regimen initiated : 02/17/2022 Topical wound management initiated : 02/17/2022 Notes: Electronic Signature(s) Signed: 05/13/2023 12:37:09 PM By: [name] Deed RN, BSN Entered By: [name] on 05/13/2023 08:06:26 -------------------------------------------------------------------------------- Pain Assessment Details Patient Name: Date of Service: [name] Nakayama [name] 05/13/2023 11:00 A M Medical Record Number: [id number] Patient Account Number: [id number] Date of Birth/Sex: Treating RN: [birth year][name] (72 y.o. M) Primary Care Guilianna Mckoy: [name] Other Clinician: Referring Laekyn Rayos: Treating Sylvi Rybolt/Extender: [name] in Treatment: 56 Active Problems Location of Pain Severity and Description of Pain Patient Has Paino No Site Locations Rate the pain. Current Pain Level: 0 Pain Management and Medication Current Pain Management: Electronic Signature(s) Signed: 05/13/2023 12:37:09 PM By: [name] Deed RN,  BSN Entered By: [name] on 05/13/2023 08:07:19 -------------------------------------------------------------------------------- Patient/Caregiver Education Details Patient Name: Date of Service: [name], [name] 10/18/2024andnbsp11:00 A M Medical Record Number: [id number] Patient Account Number: [id number] Date of Birth/Gender: Treating RN: [name] (72 y.o. [name] Primary Care Physician: [name] Other Clinician: Referring Physician: Treating Physician/Extender: [name] in Treatment: [address], [place] ([id number]) [id number] Page 7 of 11 Education  Assessment Education Provided To: Patient Education Topics Provided Infection: Methods: Explain/Verbal Responses: Reinforcements needed, State content correctly Venous: Methods: Explain/Verbal Responses: Reinforcements needed, State content correctly Electronic Signature(s) Signed: 05/13/2023 12:37:09 PM By: [name] Deed RN, BSN Entered By: [name] on 05/13/2023 08:06:52 -------------------------------------------------------------------------------- Wound Assessment Details Patient Name: Date of Service: [name] Nakayama [name] 05/13/2023 11:00 A M Medical Record Number: [id number] Patient Account Number: [id number] Date of Birth/Sex: Treating RN: [birth year] [name] (72 y.o. [name] Primary Care Lecretia Buczek: [name] Other Clinician: Referring Alira Fretwell: Treating Champion Corales/Extender: [name] in Treatment: 65 Wound Status Wound Number: 10 Primary Lymphedema Etiology: Wound Location: Left, Anterior Lower Leg Wound Healed - Epithelialized Wounding Event: Blister Status: Date Acquired: 04/26/2023 Comorbid Cataracts, Anemia, Lymphedema, Congestive Heart Failure, Deep Weeks Of Treatment: 2 History: Vein Thrombosis, Hypertension, Peripheral Venous Disease Clustered Wound: No Photos Wound Measurements Length:  (cm) Width: (cm) Depth: (cm) Area: (cm) Volume: (cm) 0 % Reduction in Area: 100% 0 % Reduction in Volume: 100% 0 Epithelialization: Large (67-100%) 0 Tunneling: No 0 Undermining: No Wound Description Classification: Full Thickness Without Exposed Support Structures Exudate Amount: None Present [name], [name] ([id number]) Wound Bed Granulation Amount: None Present (0%) Necrotic Amount: None Present (0%) Foul Odor After Cleansing: No Slough/Fibrino No [id number] Page 8 of 11 Exposed Structure Fascia Exposed: No Fat Layer (Subcutaneous Tissue) Exposed: No Tendon Exposed: No Muscle Exposed: No Joint Exposed: No Bone Exposed: No Periwound Skin Texture Texture Color No Abnormalities Noted: Yes No Abnormalities Noted: No Atrophie Blanche: No Moisture Cyanosis: No No Abnormalities Noted: Yes Ecchymosis: No Erythema: No Hemosiderin Staining: Yes Temperature / Pain Temperature: No Abnormality Electronic Signature(s) Signed: 05/13/2023 12:37:09 PM By: [name] Deed RN, BSN Entered By: [name] on 05/13/2023 08:23:10 -------------------------------------------------------------------------------- Wound Assessment Details Patient Name: Date of Service: [name] Nakayama [name] 05/13/2023 11:00 A M Medical Record Number: [id number] Patient Account Number: [id number] Date of Birth/Sex: Treating RN: [name] (72 y.o. [name] Primary Care Guerin Lashomb: [name] Other Clinician: Referring Ivon Roedel: Treating Aarika Moon/Extender: [name] in Treatment: 65 Wound Status Wound Number: 3 Primary Lymphedema Etiology: Wound Location: Right, Medial Lower Leg Wound Open Wounding Event: Gradually Appeared Status: Date Acquired: 12/07/2021 Comorbid Cataracts, Anemia, Lymphedema, Congestive Heart Failure, Deep Weeks Of Treatment: 65 History: Vein Thrombosis, Hypertension, Peripheral Venous Disease Clustered Wound:  No Photos Wound Measurements Length: (cm) 7.3 Width: (cm) 5.8 Depth: (cm) 0.3 Area: (cm) 33.254 Volume: (cm) 9.976 % Reduction in Area: -522.6% % Reduction in Volume: -367% Epithelialization: Medium (34-66%) Tunneling: No Undermining: No Wound Description Classification: Full Thickness With Exposed Support Structures Wound Margin: Flat and Intact [name], [name] ([id number]) Exudate Amount: Medium Exudate Type: Serosanguineous Exudate Color: red, brown Foul Odor After Cleansing: No Slough/Fibrino Yes [id number] Page 9 of 11 Wound Bed Granulation Amount: Small (1-33%) Exposed Structure Granulation Quality: Red Fascia Exposed: No Necrotic Amount: Large (67-100%) Fat Layer (Subcutaneous Tissue) Exposed: Yes Necrotic Quality: Eschar, Adherent Slough Tendon Exposed: No Muscle Exposed: No Joint Exposed: No Bone Exposed: No Periwound Skin Texture Texture Color No Abnormalities Noted: Yes No Abnormalities Noted: No Erythema: No Moisture Hemosiderin Staining: Yes No Abnormalities Noted: Yes Rubor: Yes Temperature / Pain Temperature: No Abnormality Tenderness on Palpation: Yes Treatment Notes Wound #3 (Lower Leg) Wound Laterality: Right, Medial Cleanser Peri-Wound Care Skin Prep Discharge Instruction: cavelon to macerated wound margins Sween Lotion (Moisturizing lotion) Discharge Instruction: Apply moisturizing lotion as directed Topical Primary Dressing Maxorb Extra Ag+ Alginate Dressing, 4x4.75 (in/in) Discharge Instruction: Apply to excoriated weeping areas Secondary  Dressing ABD Pad, 8x10 Discharge Instruction: Apply over primary dressing as directed. Secured With Compression Wrap ThreePress (3 layer compression wrap) Discharge Instruction: Apply three layer compression as directed. Compression Stockings Add-Ons Electronic Signature(s) Signed: 05/13/2023 12:37:09 PM By: [name] Deed RN, BSN Entered By: [name] on  05/13/2023 21:30:86 -------------------------------------------------------------------------------- Wound Assessment Details Patient Name: Date of Service: [name], [name] 05/13/2023 11:00 A M Medical Record Number: [id number] Patient Account Number: [id number] Date of Birth/Sex: Treating RN: [name] (72 y.o. [name] Primary Care Aureliano Oshields: [name] Other Clinician: Referring Akina Maish: Treating Aubrielle Stroud/Extender: [name] in Treatment: [address] [place] ([id number]) [id number] Page 10 of 11 Wound Status Wound Number: 6 Primary Calcinosis Etiology: Wound Location: Right, Anterior Lower Leg Wound Open Wounding Event: Gradually Appeared Status: Date Acquired: 05/12/2022 Comorbid Cataracts, Anemia, Lymphedema, Congestive Heart Failure, Deep Weeks Of Treatment: 52 History: Vein Thrombosis, Hypertension, Peripheral Venous Disease Clustered Wound: Yes Photos Wound Measurements Length: (cm) Width: (cm) Depth: (cm) Clustered Quantity: Area: (cm) Volume: (cm) 0.3 % Reduction in Area: 69.9% 0.3 % Reduction in Volume: 12.5% 0.3 Epithelialization: Large (67-100%) 2 Tunneling: No 0.071 Undermining: No 0.021 Wound Description Classification: Full Thickness Without Exposed Sup Wound Margin: Epibole Exudate Amount: Small Exudate Type: Serous Exudate Color: amber port Structures Foul Odor After Cleansing: No Slough/Fibrino No Wound Bed Granulation Amount: None Present (0%) Exposed Structure Necrotic Amount: None Present (0%) Fascia Exposed: No Fat Layer (Subcutaneous Tissue) Exposed: Yes Tendon Exposed: No Muscle Exposed: No Joint Exposed: No Bone Exposed: No Periwound Skin Texture Texture Color No Abnormalities Noted: Yes No Abnormalities Noted: No Hemosiderin Staining: Yes Moisture No Abnormalities Noted: Yes Temperature / Pain Temperature: No Abnormality Assessment Notes probes to calcium  deposit Treatment Notes Wound #6 (Lower Leg) Wound Laterality: Right, Anterior Cleanser Peri-Wound Care Sween Lotion (Moisturizing lotion) Discharge Instruction: Apply moisturizing lotion as directed Topical Primary Dressing Maxorb Extra Ag+ Alginate Dressing, 2x2 (in/in) Discharge Instruction: Apply to wound bed as instructed Secondary Dressing KAYSEN, FLORA ([id number]) [id number] Page 11 of 11 ABD Pad, 8x10 Discharge Instruction: Apply over primary dressing as directed. Secured With Compression Wrap ThreePress (3 layer compression wrap) Discharge Instruction: Apply three layer compression as directed. Compression Stockings Add-Ons Electronic Signature(s) Signed: 05/13/2023 12:37:09 PM By: [name] Deed RN, BSN Entered By: [name] on 05/13/2023 08:25:10 -------------------------------------------------------------------------------- Vitals Details Patient Name: Date of Service: [name] Nakayama [name] 05/13/2023 11:00 A M Medical Record Number: [id number] Patient Account Number: [id number] Date of Birth/Sex: Treating RN: [birth year] (72 y.o. M) Primary Care Ashani Pumphrey: [name] Other Clinician: Referring Terie Lear: Treating Diane Hanel/Extender: [name] in Treatment: 58 Vital Signs Time Taken: 11:00 Temperature (F): 97.9 Height (in): 71 Pulse (bpm): 69 Weight (lbs): 267 Respiratory Rate (breaths/min): 18 Body Mass Index (BMI): 37.2 Blood Pressure (mmHg): 139/68 Reference Range: 80 - 120 mg / dl Electronic Signature(s) Signed: 05/13/2023 12:37:09 PM By: [name] Deed RN, BSN Entered By: [name] on 05/13/2023 08:07:11

## 2023-05-13 NOTE — Progress Notes (Signed)
DIOGENES, UDELHOFEN ([id number]) [id number] Page 1 of 16 Visit Report for 05/13/2023 Chief Complaint Document Details Patient Name: Date of Service: [name], [name] 05/13/2023 11:00 A M Medical Record Number: [id number] Patient Account Number: [id number] Date of Birth/Sex: Treating RN: September 06, 1950 (72 y.o. M) Primary Care Provider: [name] Other Clinician: Referring Provider: Treating Provider/Extender: [name] in Treatment: 75 Information Obtained from: Patient Chief Complaint Patient seen for complaints of Non-Healing Wounds. Electronic Signature(s) Signed: 05/13/2023 12:09:04 PM By: Duanne Guess MD FACS Entered By: Duanne Guess on 05/13/2023 09:09:04 -------------------------------------------------------------------------------- Debridement Details Patient Name: Date of Service: [name] 05/13/2023 11:00 A M Medical Record Number: [id number] Patient Account Number: [id number] Date of Birth/Sex: Treating RN: 04-19-1951 (72 y.o. [name] Primary Care Provider: [name] Other Clinician: Referring Provider: Treating Provider/Extender: [name] in Treatment: 65 Debridement Performed for Assessment: Wound #3 Right,Medial Lower Leg Performed By: Physician Duanne Guess, MD The following information was scribed by: Zenaida Deed The information was scribed for: Duanne Guess Debridement Type: Debridement Level of Consciousness (Pre-procedure): Awake and Alert Pre-procedure Verification/Time Out Yes - 11:35 Taken: Start Time: 11:37 Pain Control: Lidocaine 4% Topical Solution Percent of Wound Bed Debrided: 40% T Area Debrided (cm): otal 13.29 Tissue and other material debrided: Non-Viable, Eschar, Slough, Skin: Epidermis, Slough Level: Skin/Epidermis Debridement Description: Selective/Open Wound Instrument: Curette Bleeding: Minimum Hemostasis Achieved:  Pressure Procedural Pain: 2 Post Procedural Pain: 0 Response to Treatment: Procedure was tolerated well Level of Consciousness (Post- Awake and Alert procedure): Post Debridement Measurements of Total Wound Length: (cm) 7.3 Width: (cm) 5.8 Depth: (cm) 0.4 Volume: (cm) 13.302 Character of Wound/Ulcer Post Debridement: Improved [name], [name] ([id number]) [id number] Page 2 of 16 Post Procedure Diagnosis Same as Pre-procedure Electronic Signature(s) Signed: 05/13/2023 12:31:34 PM By: Duanne Guess MD FACS Signed: 05/13/2023 12:37:09 PM By: Zenaida Deed RN, BSN Entered By: Zenaida Deed on 05/13/2023 08:39:55 -------------------------------------------------------------------------------- HPI Details Patient Name: Date of Service: [name] 05/13/2023 11:00 A M Medical Record Number: [id number] Patient Account Number: [id number] Date of Birth/Sex: Treating RN: 02-17-1951 (72 y.o. M) Primary Care Provider: [name] Other Clinician: Referring Provider: Treating Provider/Extender: [name] in Treatment: 20 History of Present Illness HPI Description: ADMISSION 02/09/2022 This is a 72 year old man who recently moved to [place] from [place]. He has a history of of coronary artery disease and prior left popliteal vein DVT . He is not diabetic and does not smoke. 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 He has had multiple spinal surgeries and has limited mobility. He primarily uses a motorized wheelchair. He has had lymphedema for many years and does have lymphedema pumps although he says he does not use them frequently. He has wounds on his bilateral lower extremities. He was receiving care in [place] for these prior to his move. They were apparently packing his wounds with Betadine soaked gauze. He has worn compression wraps in the past but not recently. He did say that they helped tremendously.  In clinic today, his right ABI is noncompressible but has good signals; the left ABI was 1.17. No formal vascular studies have been performed. On his left dorsal foot, there is a circular lesion that exposes the fat layer. There is fairly heavy slough accumulation within the wound, but no significant odor. On his right medial calf, he has multiple pinholes that have slough buildup in them and probe for about 2 to 4 mm in depth. They are  draining clear fluid. On his right lateral midfoot, he has ulceration consistent with venous stasis. It is geographic and has slough accumulation. 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 02/17/2022: All of the wounds are little bit larger today and have more slough accumulation. Today, the medial calf openings are larger and probe to something that feels calcified. There is odor coming from his wounds. His vascular studies are scheduled for August 9 and 10. 02-24-2022 upon evaluation today patient presents for follow-up concerning his ongoing issues here with his lower extremities. With that being said he does have significant problems with what appears to be cellulitis unfortunately the PCR culture that was sent last week got crushed by [employer] in transit. With that being said we are going to reobtain a culture today although I am actually to do it on the right medial leg as this seems to be the most inflamed area today where there is some questionable drainage as well. I am going to remove some of the calcium deposits which are loosening obtain a deeper culture from this area. Fortunately I do not see any evidence of active infection systemically which is good news but locally I think we are having a bigger issue here. He does have his appointment next Thursday with vascular. Patient has also been referred to [facility] for further evaluation and treatment of the calcinosis for  possible sulfate injections. 03/04/2022: The culture that was performed last week grew out multiple species including Klebsiella, Morganella, and Pseudomonas aeruginosa. He had been prescribed Bactrim but this was not adequate to cover all of the species and levofloxacin was recommended. He completed the Bactrim but did not pick up the levofloxacin and begin taking it until yesterday. 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 He had arterial studies performed today. He does have evidence of peripheral arterial disease. Results copied here: +-------+-----------+-----------+------------+------------+ ABI/TBIT oday's ABIT oday's TBIPrevious ABIPrevious TBI +-------+-----------+-----------+------------+------------+ Right [place] 0.75    +-------+-----------+-----------+------------+------------+ Left [place] 0.57    +-------+-----------+-----------+------------+------------+ Arterial wall calcification precludes accurate ankle pressures and ABIs. Summary: Right: Resting right ankle-brachial index indicates noncompressible right lower extremity arteries. The right toe-brachial index is normal. Left: Resting left ankle-brachial index indicates noncompressible left lower extremity arteries. The left toe-brachial index is abnormal. He continues to have further tissue breakdown at the right medial calf and there is ample slough accumulation along with necrotic subcutaneous tissue. The left dorsal foot wound has built up slough, as well. The right medial foot wound is nearly closed with just a light layer of eschar over the surface. The right dorsal lateral foot wound has also accumulated a fair amount of slough and remains quite tender. [name], [name] ([id number]) [id number] Page 3 of 16 03/10/2022: He  has been taking the prescribed levofloxacin and has about 3 days left of this. The erythema and induration on his right leg has improved. He continues to experience further breakdown of the right medial calf wound. There is extensive slough and debris accumulation there. There is heavy slough on his left dorsal foot wound, but no odor or purulent drainage. The right medial foot wound appears to be closed. The right dorsal lateral foot wound also has a fair amount of slough but it is less tender than at our last visit. He still does not  have an appointment with dermatology. 03/22/2022: The right anterior tibial wound has closed. The right lateral foot wound is nearly closed with just a little bit of slough. The left dorsal foot wound is smaller and continues to have some slough buildup but less so than at prior visits. There is good granulation tissue underlying the slough. Unfortunately the right medial calf wound continues to deteriorate. It has a foul odor today and a lot of necrotic tissue, the surrounding skin is also erythematous and moist. 03/30/2022: The right lateral foot wound continues to contract and just has a little bit of slough and eschar present. The left dorsal foot wound is also smaller with slough overlying good granulation tissue. The right medial calf wound actually looks quite a bit better today; there is no odor and there is much less necrotic tissue although some remains present. We have been using topical gentamicin and he has been taking oral Augmentin. 04/07/2022: The patient saw dermatology at [facility] last week. Unfortunately, it appears that they did not read any of the documentation that was provided. 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 Overall, it was a highly unsatisfactory consultation. In the meantime, however, we were able to identify compounding pharmacy who could create a topical sodium thiosulfate ointment. The patient has that with him today. Both of the right foot wounds are now closed. The left dorsal foot wound has contracted but still has a layer of slough on the surface. The medial calf wounds on the right all look a little bit better. They still have heavy slough along with the chunks of calcium. Everything is stained purple. 04/14/2022: The wound on his dorsal left foot is a little bit smaller again today. There is still slough accumulation on the surface. The medial calf wounds have quite a bit less slough accumulation today. His right leg, however, is warm and erythematous, concerning for cellulitis. 04/14/2022: He completed his course of Augmentin yesterday. The medial calf wound sites are much cleaner today and shallower. There is less fragmented calcium in the wounds. He has a lot of lymphedema fluid-like drainage from these wounds, but no purulent discharge or malodor. The wound on his dorsal left foot has also contracted and is shallower. There is a layer of slough over healthy granulation tissue. 05/05/2022: The left dorsal foot wound is smaller today with less slough and more granulation tissue. The right medial calf wounds are shallower, but have extensive slough and some fat necrosis. The drainage has diminished considerably, however. 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 05/12/2022: The left dorsal foot wound continues to contract and fill with granulation tissue. There is slough on the wound surface. The right medial calf wounds also continue to fill with healthy tissue, but there is remaining slough and fat necrosis present. He had a significant  increase in his drainage this week and it was a bright yellow-green color. He also had a new wound open over his right anterior tibial surface associated with the spike of cutaneous calcium. The leg is more red, as well, concerning for infection. 05/19/2022: His culture returned positive for Pseudomonas. He is currently taking  a course of oral levofloxacin. We have also been using topical gentamicin mixed in with his sodium thiosulfate. All of the wounds look better this week. 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 There is slough and nonviable subcutaneous tissue still present. The new anterior tibial wound just has a light layer of slough. The dorsal foot wound also has some slough present and appears a little dry today. 05/26/2022: The right medial leg wound continues to improve. It is feeling with granulation tissue, but still accumulates a fairly thick layer of slough on the surface. The more recent anterior tibial wound has remained quite small, but also has some slough on the surface. The left dorsal foot wound has contracted somewhat and has perimeter epithelialization. He completed his oral levofloxacin. 06/02/2022: The left dorsal foot wound continues to improve significantly. There is just a little slough on the wound surface. The right medial leg wound had black drainage, but it looks like silver alginate was applied last week and I theorized that silver sulfide was created with a combination of the silver alginate and the sodium thiosulfate. It did, however, have a bit of an odor to it and extensive slough accumulation. The small anterior tibial wound also had a bit of slough and nonviable subcutaneous tissue present. The skin of his leg was rather macerated, as well. 06/09/2022: The left dorsal foot wound is smaller again this week with just a light layer of slough on the surface. The right medial leg wound looks substantially better. The leg and  periwound skin are no longer red and macerated. There is good granulation tissue forming with less slough and nonviable tissue. The anterior tibial wound just has a small amount of slough accumulation. 06/16/2022: The left dorsal foot wound continues to contract. His wrap slipped a little bit, however, and his foot is more swollen. The right medial leg wound continues to improve. The underlying tissue is more vital-appearing and there is less slough. He does have substantial drainage. The small anterior tibial wound has a bit of slough accumulation. 06/23/2022: The left dorsal foot wound is about half the size as it was last week. There is just some light slough on the surface and some eschar buildup around the edges. The right anterior leg wound is small with a little slough on the surface. Unfortunately, the right medial leg wound deteriorated fairly substantially. It is malodorous with gray/green/black discoloration. 06/30/2022: The left dorsal foot wound continues to contract. It is nearly closed with just a light layer of slough present. The right anterior leg wound is about the same size and has a small amount of slough on the surface. The right medial leg wound looks a lot better than it did last week. The odor has abated and the tissue is less pulpy and necrotic-looking. There is still a fairly thick layer of slough present. 12/13; the left dorsal foot wound looks very healthy. There was no need to change the dressing here and no debridement. He has a small area on the right anterior lower leg apparently had not changed much in size. The area on the medial leg is the most problematic area this is large with some depth and a completely nonviable surface today. 07/14/2022: The left dorsal foot wound is smaller today with just a little slough and eschar present. The right anterior lower leg wound is about the same without any significant change. The medial leg wound has a black and green surface  and is malodorous.  No purulent drainage and the periwound skin is intact. Edema control is suboptimal. 07/21/2022: The dorsal foot wound continues to contract and is nearly closed with just a little bit of slough and eschar on the surface. The right anterior lower leg wound is shallower today but otherwise the dimensions are unchanged. The medial leg wound looks significantly better although it still has a nonviable surface; the odor and black/green surface has resolved. His culture came back with multiple species sensitive to Augmentin, which was prescribed and a very low level of Pseudomonas, which should be handled by the topical gentamicin we have been using. 07/28/2022: The dorsal foot wound is down to just a very tiny opening with a little eschar on the surface. The right anterior tibial wound is about the same with some slough buildup. The right medial leg wound looks quite a bit better this week. There is thick rubbery slough on the surface, but the odor and drainage has improved significantly. 08/04/2022: The dorsal foot wound is almost healed, but the periwound skin has broken down. This appears to be secondary to moisture and adhesive from the absorbent pad. The right anterior tibial wound is shallower. The right medial leg wound continues to improve. It is smaller, still with rubbery slough on the surface. No malodor or purulent drainage. 08/11/2022: The dorsal foot wound is about the same size. It is a little bit dry with some slough accumulation. The right anterior tibial wound is also unchanged. The right medial leg wound is filling in further with good granulation tissue. Still with some slough buildup on the surface. [name], [name] ([id number]) [id number] Page 4 of 16 08/18/2022: His left leg is bright red, hot, and swollen with cellulitis. The dorsal foot wound actually looks quite good and is superficial with just a little bit of slough accumulation. The right  anterior tibial wound is unchanged. The right medial leg wound has filled in with good granulation tissue but has copious amounts of drainage. 08/25/2022: His cellulitis has responded nicely to the oral antibiotics. His dorsal foot wound is smaller but has a fair amount of slough buildup. The right anterior tibial wound remains stable and unchanged. The right medial leg wound has more granulation tissue under a layer of slough, but continues to have copious drainage. The drainage is actually causing skin irritation and threatens to result in breakdown. 09/01/2022: He continues to have profuse drainage from his wounds which is resulting in tissue maceration on both the dorsum of his left foot and the periwound distal to his medial leg wound. The dorsal foot wound is nearly healed, despite this. The medial right leg wound is filling in with good granulation tissue. We are working on getting home health assistance to change his dressings more frequently to try and control the drainage better. 09/08/2022: We have had success in controlling his drainage. The tissue maceration has improved significantly and his swelling has come down quite markedly. The medial right leg wound is much more superficial and has substantially less nonviable tissue present. The anterior tibial wound is a little bit larger, however with some nonviable tissue present. 09/15/2022: The dorsal foot wound is nearly closed. The anterior tibial wound measures slightly larger today. The medial right leg wound continues to improve quite dramatically with less nonviable tissue and more robust-looking granulation. Edema control is significantly better. 09/22/2022: The left dorsal foot wound remains open, albeit quite tiny. The anterior tibial wound is a little bit larger again today. The medial right leg wound continues  to improve with an increase in robust and healthy granulation tissue and less accumulation of nonviable tissue. 09/29/2022: His  home health providers wrapped his left leg extremely tightly and he ended up having to cut the wrap off of his foot. This resulted in his foot being very swollen and that the dorsal foot wound is a little bit larger today. The anterior tibial wound is about the same size with a little slough on the surface. The medial right leg wound continues to fill in. There are very few areas with any significant depth. There is still slough accumulation. 3/13; Patient has a left dorsal foot wound along with a right anterior tibial wound and right medial wound. We are using silver alginate to the left dorsal foot wound, Santyl and sodium thiosulfate compound ointment to the right lower extremity wounds under 4 layer compression. Patient has no issues or complaints today. 10/13/2022: The left dorsal foot wound is a little bit bigger today, but his foot is also a little bit more swollen. The right medial leg wound has filled in more and has substantially less depth. There is still some nonviable tissue present. The small anterior lower leg wound looks about the same. 10/20/2022: The left dorsal foot wound is epithelializing. There does not appear to be much open at this site. There is some eschar around the edges. The right medial leg wound continues to fill in with granulation tissue. The small anterior lower leg wound is a little bit bigger and there is a chunk of calcium protruding. He continues to have fairly copious drainage from the right leg. 10/27/2022: His home health agency failed to show up at all this past week and as a result, the excessive drainage from his right leg wound has resulted in tissue breakdown. His leg is red and irritated. The wounds themselves look about the same. The wound on his left dorsal foot is nearly closed underneath some eschar. 11/03/2022: We continue to have difficulty with his home health agency. He continues to have significant drainage from his right leg wound which has  resulted in even further tissue breakdown. He also was not taking his Lasix as prescribed and I think much of the drainage has been his lymphedema fluid choosing the path of least resistance. The wound on his left dorsal foot is closed, but in the process of cleaning the site, dry skin was pulled off and he has a new small superficial wound just adjacent to where the dorsal foot wound was. 11/12/2022: His left dorsal foot is completely healed. The right medial leg continues to deteriorate. He is not taking his Lasix as prescribed, nor is he using his lymphedema pumps at all. As a result, he has massive amounts of drainage saturating his dressings and causing further tissue breakdown on his medial leg. Today, his dressings had a foul odor, as well. 11/19/2022: His right medial leg looks even worse today. There is more erythema and moisture related tissue breakdown. The 2 anterior wounds look a little bit better. The culture that I took last week did produce Pseudomonas and he is currently taking levofloxacin for this. 11/26/2022: There has been significant improvement to his right medial leg. It is far less raw and many of the superficial open areas have epithelialized. He continues to cut the foot portion of his wrap and as result his foot is bulbous and swollen compared to the rest of his leg, where edema control is good. The smaller of the 2 anterior tibial wound  is closed, while the larger is contracting somewhat with a bit of slough at the base. 12/03/2022: His leg continues to demonstrate improvement. There has been epithelialization of much of the denuded area. He has a new small anterior tibial wound that has opened around a nidus of calcium. Edema control is improved. 12/10/2022: Continued improvement in the periwound skin. The anterior tibial wounds are about the same, but the medial leg wound has improved further. There is less nonviable tissue present. 12/17/2022: Between his visit on Tuesday  with the nurse and today, he has had significantly more drainage which has resulted in some periwound tissue maceration. I suspect this correlates somewhat with his diuretic use, as he says he takes his diuretic religiously on Saturday, Sunday, and Monday and then does not take it after Tuesday due to being out and about and not being close to easy restroom access. I suspect the excess fluid is just simply draining out of his leg. 12/21/2022: This was supposed to be a nurse visit, but his leg has broken down so significantly, that we have converted to a wound care visit. He did take his diuretics over the weekend, but despite this, he has had copious drainage and all of his tissues have broken down substantially from the right medial leg ulcer all the way down to his ankle and even wrapping posterior to this. There is blue-green staining on his dressing, suggesting ongoing presence of Pseudomonas aeruginosa. 12/24/2022: The wound already looks better than it did 2 days ago. He has been taking ciprofloxacin and the Urgo clean dressing we applied did a much better job of wicking the drainage away from his leg. There is slough on the entire surface of the medial leg wound, but the erythema and induration has improved in the past couple of days. 12/31/2022: Continued improvement of the wound. He says that the pain has basically abated since being on ciprofloxacin. Still with fairly heavy drainage and slough accumulation. 01/07/2023: Overall continued improvement. Still with heavy slough buildup, but less drainage. 01/14/2023: There is a little bit of improvement. The skin is still looking a bit excoriated. He has a few more days left of ciprofloxacin. 01/21/2023: There has been significant improvement. The periwound skin is in much better condition. The ulcers are shallower and actually have some good granulation tissue present under some slough. 01/28/2023: The wound continues to improve. All of the open areas  measured smaller today. There is slough accumulation on the surfaces. His drainage has decreased markedly. 02/04/2023: Continued improvement in the wound. Drainage remains significantly diminished. Slough accumulation is present but to a lesser extent. Periwound skin continues to improve. Edema control is good. [name], [name] ([id number]) [id number] Page 5 of 16 02/18/2023: The heavy drainage persists, although it is was not as significant today as it was at his nurse visit on Tuesday. The drainage is yellow and does not have the blue-green discoloration suggestive of Pseudomonas. The actual wounds on his leg are getting smaller, but the periwound skin is quite excoriated. 02/25/2023: His periwound skin is quite excoriated and he has a lot of clear yellow drainage. The main wounds are filling in further and epithelializing. 03/04/2023: The excoriation of the periwound skin has improved. Most of that area has dried up. The main wounds have less slough in them today. 03/11/2023: The main wounds have accumulated a little bit of slough. The periwound excoriation continues to improve. Unfortunately, he suffered a fall over the previous weekend and has abrasions on his knees bilaterally  and on his right wrist and upper arm. In addition, the skin on his left lower leg apparently blistered and opened, leaving several superficial wounds on this extremity. He has been approved for the [facility] wound VAC and has the equipment with him today. 03/18/2023: He has extensive excoriation to the periwound skin on the right medial lower leg. The actual wounds themselves are smaller with slough accumulation. The top of his left foot has begun weeping again. Using the [facility] wound VAC, he says that he through 4 canisters. He seems to be very fluid overloaded, without any significant dyspnea. 03/25/2023: The wound to his left anterior tibial surface has healed. The left dorsal foot wound has  gotten smaller and has a small area of hypertrophic granulation tissue present. The periwound skin on the right medial lower leg looks slightly better, but still appears excoriated. The actual wounds continue to contract but are still accumulating quite a bit of slough. The tiny right anterior leg wounds are contracting. 04/01/2023: The left dorsal foot wound got a little bit bigger this week. There is slough accumulation. The periwound skin on the right medial lower leg continues to improve. The main medial right lower leg wounds have a dark gray discoloration and there is a strong odor coming from them. The back of his right lower leg just above the ankle also has a little bit of new breakdown where he is draining edema fluid. The small anterior leg wounds are stable. 04/08/2023: Since starting antibiotics last week, there has been tremendous improvement. The left dorsal foot wound is nearly closed with just a little bit of eschar on the surface. The right medial lower leg is less excoriated and red and he has had substantially less drainage. The amount of slough on the open areas of his wounds has decreased markedly and there is no longer any odor. The back of his right lower leg that was newly open last week has dried up, but is not completely closed yet. 04/15/2023: The improvement continues with significantly less drainage, resulting in less excoriation and irritation. The back of his right lower leg is closed, as is the left dorsal foot wound. The open areas on the right medial leg are smaller. 04/22/2023: His drainage has diminished significantly. The tissue maceration and excoriation surrounding the right medial leg wound has also improved substantially. There is slough on all of the open wound surfaces. He does have a bit of an odor coming from his wound today, but there is no purulent drainage nor any significant discoloration of the drainage in his VAC. 04/29/2023: He has a new wound on his left  lower anterior tibial surface. This was a blister that came up and then ruptured. There is thin slough on the wound surface but no concern for infection. He has had significantly diminished drainage from his right medial leg ulcers. He was previously going through 4 canisters a day secondary to drainage and this week, he has used only 1 for the entire week. The open areas on his right medial leg are smaller and fewer in number. There is slough on all of the surfaces. No malodor appreciated. 05/06/2023: The wound on his left anterior tibial surface has epithelialized significantly. There is just a small area open in the center. The wound is clean without any slough or other debris accumulation. The drainage on the right leg has almost completely dry. There is slough on the open wound surfaces with significantly more epithelialized between the  open sites. He continues on levofloxacin. 05/13/2023: His left lower leg wound is healed. The right lower leg wound continues to improve. The drainage has been minimal and he has not been in the wound VAC for over a week now. There is better skin filling between the open areas and less slough accumulation on the open surfaces. Electronic Signature(s) Signed: 05/13/2023 12:10:19 PM By: Duanne Guess MD FACS Entered By: Duanne Guess on 05/13/2023 09:10:19 -------------------------------------------------------------------------------- Physical Exam Details Patient Name: Date of Service: [name], [name] 05/13/2023 11:00 A M Medical Record Number: [id number] Patient Account Number: [id number] Date of Birth/Sex: Treating RN: 1950/12/30 (72 y.o. M) Primary Care Provider: [name] Other Clinician: Referring Provider: Treating Provider/Extender: [name] in Treatment: 65 Constitutional . . . . no acute distress. Respiratory Normal work of breathing on room air. Notes 05/13/2023: His left lower leg wound is healed. The  right lower leg wound continues to improve. The drainage has been minimal and he has not been in the wound VAC for over a week now. There is better skin filling between the open areas and less slough accumulation on the open surfaces. Electronic Signature(s) Signed: 05/13/2023 12:13:33 PM By: Duanne Guess MD FACS [place], [name] ([id number]) [id number] Page 6 of 16 Entered By: Duanne Guess on 05/13/2023 09:13:32 -------------------------------------------------------------------------------- Physician Orders Details Patient Name: Date of Service: [name], [name] 05/13/2023 11:00 A M Medical Record Number: [id number] Patient Account Number: [id number] Date of Birth/Sex: Treating RN: 1951-07-06 (72 y.o. 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 - Dr. [name] RM 1 Return A Friday 05/20/2023 @ 1100 Anesthetic (In clinic) Topical Lidocaine 4% applied to wound bed Bathing/ Shower/ Hygiene May shower with protection but do not get wound dressing(s) wet. Protect dressing(s) with water repellant cover (for example, large  plastic bag) or a cast cover and may then take shower. Negative Presssure Wound Therapy Discontinue wound vac. Call the number on the machine for the company to pick up. Edema Control - Lymphedema / SCD / Other Lymphedema Pumps. Use Lymphedema pumps on leg(s) 2-3 times a day for 45-60 minutes. If wearing any wraps or hose, do not remove them. Continue exercising as instructed. Elevate legs to the level of the heart or above for 30 minutes daily and/or when sitting for 3-4 times a day throughout the day. - throughout the day Avoid standing for long periods of time. Exercise regularly Wound Treatment Wound #3 - Lower Leg Wound Laterality: Right, Medial Peri-Wound Care: Skin Prep 1 x Per Week/30 Days Discharge Instructions: cavelon to macerated wound margins Peri-Wound Care: Sween Lotion (Moisturizing lotion) 1 x Per Week/30 Days Discharge Instructions: Apply moisturizing lotion as directed Prim Dressing: Maxorb Extra Ag+ Alginate Dressing, 4x4.75 (in/in) 1 x Per Week/30 Days ary Discharge Instructions: Apply to excoriated weeping areas Secondary Dressing: ABD Pad, 8x10 1 x  Per Week/30 Days Discharge Instructions: Apply over primary dressing as directed. Compression Wrap: ThreePress (3 layer compression wrap) 1 x Per Week/30 Days Discharge Instructions: Apply three layer compression as directed. Wound #6 - Lower Leg Wound Laterality: Right, Anterior ASRIEL, OKON ([id number]) [id number] Page 7 of 16 Peri-Wound Care: Sween Lotion (Moisturizing lotion) 1 x Per Week/30 Days Discharge Instructions: Apply moisturizing lotion as directed Prim Dressing: Maxorb Extra Ag+ Alginate Dressing, 2x2 (in/in) 1 x Per Week/30 Days ary Discharge Instructions: Apply to wound bed as instructed Secondary Dressing: ABD Pad, 8x10 1 x Per Week/30 Days Discharge Instructions: Apply over primary dressing as directed. Compression Wrap: ThreePress (3 layer compression wrap) 1 x Per  Week/30 Days Discharge Instructions: Apply three layer compression as directed. Electronic Signature(s) Signed: 05/13/2023 12:31:34 PM By: Duanne Guess MD FACS Entered By: Duanne Guess on 05/13/2023 09:13:49 -------------------------------------------------------------------------------- Problem List Details Patient Name: Date of Service: [name] 05/13/2023 11:00 A M Medical Record Number: [id number] Patient Account Number: [id number] Date of Birth/Sex: Treating RN: 1951-07-13 (72 y.o. 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 02/09/2022 No Yes I89.0 Lymphedema, not elsewhere classified 02/09/2022 No Yes I87.2 Venous insufficiency (chronic) (peripheral) 02/09/2022 No Yes Z86.718 Personal history of other venous thrombosis and embolism 02/09/2022 No Yes I10 Essential (primary) hypertension 02/09/2022 No Yes I25.10 Atherosclerotic heart disease of native coronary artery without angina pectoris 02/09/2022 No Yes Inactive Problems ICD-10 Code Description Active Date Inactive Date L97.512 Non-pressure chronic ulcer of other part of right foot with fat layer exposed 02/17/2022 02/17/2022 [name] ([id number]) [id number] Page 8 of 16 Resolved Problems ICD-10 Code Description Active Date Resolved Date L97.821 Non-pressure chronic ulcer of other part of left lower leg limited to breakdown of skin 03/11/2023 03/11/2023 L98.492 Non-pressure chronic ulcer of skin of other sites with fat layer exposed 03/11/2023 03/11/2023 L97.521 Non-pressure chronic ulcer of other part of left foot limited to breakdown of skin  03/18/2023 02/09/2022 Q59.563 Non-pressure chronic ulcer of other part of left lower leg with fat layer exposed 04/29/2023 04/29/2023 Electronic Signature(s) Signed: 05/13/2023 12:08:41 PM By: Duanne Guess MD FACS Entered By: Duanne Guess on 05/13/2023 09:08:41 -------------------------------------------------------------------------------- Progress Note Details Patient Name: Date of Service: [name] 05/13/2023 11:00 A M Medical Record Number: [id number] Patient Account Number: [id number] Date of Birth/Sex: Treating RN: 31-Dec-1950 (72 y.o. M) Primary Care Provider: [name] Other Clinician: Referring Provider: Treating Provider/Extender: [name] in Treatment: 5 Subjective Chief Complaint Information obtained from Patient Patient seen for complaints of Non-Healing Wounds. History of Present Illness (HPI) ADMISSION 02/09/2022 This is a 72 year old man who recently moved to [place] from [place]. He has a history of of coronary artery disease and prior left popliteal vein DVT . He is not diabetic and does not smoke. 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 He has had multiple spinal surgeries and has limited mobility. He primarily uses a motorized wheelchair. He has had lymphedema for many years and does have lymphedema pumps although he says he does not use them frequently. He has wounds on his bilateral lower extremities. He was receiving care in [place] for these prior to his move. They were apparently packing his wounds with Betadine soaked gauze. He  has worn compression wraps in the past but not recently. He did say that they helped tremendously. In clinic today, his right ABI is noncompressible but has good signals; the left ABI was 1.17. No formal vascular studies have been performed. On his left dorsal foot, there is a circular lesion that exposes the fat layer. There is fairly heavy slough accumulation within the  wound, but no significant odor. On his right medial calf, he has multiple pinholes that have slough buildup in them and probe for about 2 to 4 mm in depth. They are draining clear fluid. On his right lateral midfoot, he has ulceration consistent with venous stasis. It is geographic and has slough accumulation. 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 02/17/2022: All of the wounds are little bit larger today and have more slough accumulation. Today, the medial calf openings are larger and probe to something that feels calcified. There is odor coming from his wounds. His vascular studies are scheduled for August 9 and 10. 02-24-2022 upon evaluation today patient presents for follow-up concerning his ongoing issues here with his lower extremities. With that being said he does have significant problems with what appears to be cellulitis unfortunately the PCR culture that was sent last week got crushed by [employer] in transit. With that being said we are going to reobtain a culture today although I am actually to do it on the right medial leg as this seems to be the most inflamed area today where there is some questionable drainage as well. I am going to remove some of the calcium deposits which are loosening obtain a deeper culture from this area. Fortunately I do not see any evidence of active infection systemically which is good news but locally I think we are having a bigger issue here. He does have his appointment next Thursday with vascular. Patient has also been referred to [facility] for further evaluation and treatment of the calcinosis for possible sulfate injections. 03/04/2022: The culture that was performed last week grew out multiple species including Klebsiella, Morganella, and Pseudomonas aeruginosa. He had been prescribed Bactrim but this was not adequate to cover all of the species and levofloxacin was  recommended. He completed the Bactrim but did not pick up the levofloxacin and begin taking it until yesterday. 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 He had arterial studies performed today. He does have evidence of peripheral arterial disease. Results copied hereESPN, [name] ([id number]) [id number] Page 9 of 16 +-------+-----------+-----------+------------+------------+ ABI/TBIT oday's ABIT oday's TBIPrevious ABIPrevious TBI +-------+-----------+-----------+------------+------------+ Right Enterprise 0.75    +-------+-----------+-----------+------------+------------+ Left Webster 0.57    +-------+-----------+-----------+------------+------------+ Arterial wall calcification precludes accurate ankle pressures and ABIs. Summary: Right: Resting right ankle-brachial index indicates noncompressible right lower extremity arteries. The right toe-brachial index is normal. Left: Resting left ankle-brachial index indicates noncompressible left lower extremity arteries. The left toe-brachial index is abnormal. He continues to have further tissue breakdown at the right medial calf and there is ample slough accumulation along with necrotic subcutaneous tissue. The left dorsal foot wound has built up slough, as well. The right medial foot wound is nearly closed with just a light layer of eschar over the surface. The right dorsal lateral foot wound has also accumulated a fair amount of slough and remains quite tender. 03/10/2022: He  has been taking the prescribed levofloxacin and has about 3 days left of this. The erythema and induration on his right leg has improved. He continues to experience further breakdown of the right medial calf wound. There is extensive slough and debris accumulation there. There  is heavy slough on his left dorsal foot wound, but no odor or purulent drainage. The right medial foot wound appears to be closed. The right dorsal lateral foot wound also has a fair amount of slough but it is less tender than at our last visit. He still does not have an appointment with dermatology. 03/22/2022: The right anterior tibial wound has closed. The right lateral foot wound is nearly closed with just a little bit of slough. The left dorsal foot wound is smaller and continues to have some slough buildup but less so than at prior visits. There is good granulation tissue underlying the slough. Unfortunately the right medial calf wound continues to deteriorate. It has a foul odor today and a lot of necrotic tissue, the surrounding skin is also erythematous and moist. 03/30/2022: The right lateral foot wound continues to contract and just has a little bit of slough and eschar present. The left dorsal foot wound is also smaller with slough overlying good granulation tissue. The right medial calf wound actually looks quite a bit better today; there is no odor and there is much less necrotic tissue although some remains present. We have been using topical gentamicin and he has been taking oral Augmentin. 04/07/2022: The patient saw dermatology at [facility] last week. Unfortunately, it appears that they did not read any of the documentation that was provided. 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 Overall, it was a highly unsatisfactory consultation. In the meantime, however, we were able to identify compounding pharmacy who could create a topical sodium thiosulfate ointment. The patient has that with him today. Both of the right foot  wounds are now closed. The left dorsal foot wound has contracted but still has a layer of slough on the surface. The medial calf wounds on the right all look a little bit better. They still have heavy slough along with the chunks of calcium. Everything is stained purple. 04/14/2022: The wound on his dorsal left foot is a little bit smaller again today. There is still slough accumulation on the surface. The medial calf wounds have quite a bit less slough accumulation today. His right leg, however, is warm and erythematous, concerning for cellulitis. 04/14/2022: He completed his course of Augmentin yesterday. The medial calf wound sites are much cleaner today and shallower. There is less fragmented calcium in the wounds. He has a lot of lymphedema fluid-like drainage from these wounds, but no purulent discharge or malodor. The wound on his dorsal left foot has also contracted and is shallower. There is a layer of slough over healthy granulation tissue. 05/05/2022: The left dorsal foot wound is smaller today with less slough and more granulation tissue. The right medial calf wounds are shallower, but have extensive slough and some fat necrosis. The drainage has diminished considerably, however. 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 05/12/2022: The left dorsal  foot wound continues to contract and fill with granulation tissue. There is slough on the wound surface. The right medial calf wounds also continue to fill with healthy tissue, but there is remaining slough and fat necrosis present. He had a significant increase in his drainage this week and it was a bright yellow-green color. He also had a new wound open over his right anterior tibial surface associated with the spike of cutaneous calcium. The leg is more red, as well, concerning for infection. 05/19/2022: His culture returned  positive for Pseudomonas. He is currently taking a course of oral levofloxacin. We have also been using topical gentamicin mixed in with his sodium thiosulfate. All of the wounds look better this week. 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 There is slough and nonviable subcutaneous tissue still present. The new anterior tibial wound just has a light layer of slough. The dorsal foot wound also has some slough present and appears a little dry today. 05/26/2022: The right medial leg wound continues to improve. It is feeling with granulation tissue, but still accumulates a fairly thick layer of slough on the surface. The more recent anterior tibial wound has remained quite small, but also has some slough on the surface. The left dorsal foot wound has contracted somewhat and has perimeter epithelialization. He completed his oral levofloxacin. 06/02/2022: The left dorsal foot wound continues to improve significantly. There is just a little slough on the wound surface. The right medial leg wound had black drainage, but it looks like silver alginate was applied last week and I theorized that silver sulfide was created with a combination of the silver alginate and the sodium thiosulfate. It did, however, have a bit of an odor to it and extensive slough accumulation. The small anterior tibial wound also had a bit of slough and nonviable subcutaneous tissue present. The skin of his leg was rather macerated, as well. 06/09/2022: The left dorsal foot wound is smaller again this week with just a light layer of slough on the surface. The right medial leg wound looks substantially better. The leg and periwound skin are no longer red and macerated. There is good granulation tissue forming with less slough and nonviable tissue. The anterior tibial wound just has a small amount of slough accumulation. 06/16/2022: The left dorsal foot wound continues to contract. His wrap slipped a  little bit, however, and his foot is more swollen. The right medial leg wound continues to improve. The underlying tissue is more vital-appearing and there is less slough. He does have substantial drainage. The small anterior tibial wound has a bit of slough accumulation. 06/23/2022: The left dorsal foot wound is about half the size as it was last week. There is just some light slough on the surface and some eschar buildup around the edges. The right anterior leg wound is small with a little slough on the surface. Unfortunately, the right medial leg wound deteriorated fairly substantially. It is malodorous with gray/green/black discoloration. 06/30/2022: The left dorsal foot wound continues to contract. It is nearly closed with just a light layer of slough present. The right anterior leg wound is about the same size and has a small amount of slough on the surface. The right medial leg wound looks a lot better than it did last week. The odor has abated and the tissue is less pulpy and necrotic-looking. There is still a fairly thick layer of slough present. [name], [name] ([id number]) [id number] Page 10 of 16 12/13; the  left dorsal foot wound looks very healthy. There was no need to change the dressing here and no debridement. He has a small area on the right anterior lower leg apparently had not changed much in size. The area on the medial leg is the most problematic area this is large with some depth and a completely nonviable surface today. 07/14/2022: The left dorsal foot wound is smaller today with just a little slough and eschar present. The right anterior lower leg wound is about the same without any significant change. The medial leg wound has a black and green surface and is malodorous. No purulent drainage and the periwound skin is intact. Edema control is suboptimal. 07/21/2022: The dorsal foot wound continues to contract and is nearly closed with just a little bit  of slough and eschar on the surface. The right anterior lower leg wound is shallower today but otherwise the dimensions are unchanged. The medial leg wound looks significantly better although it still has a nonviable surface; the odor and black/green surface has resolved. His culture came back with multiple species sensitive to Augmentin, which was prescribed and a very low level of Pseudomonas, which should be handled by the topical gentamicin we have been using. 07/28/2022: The dorsal foot wound is down to just a very tiny opening with a little eschar on the surface. The right anterior tibial wound is about the same with some slough buildup. The right medial leg wound looks quite a bit better this week. There is thick rubbery slough on the surface, but the odor and drainage has improved significantly. 08/04/2022: The dorsal foot wound is almost healed, but the periwound skin has broken down. This appears to be secondary to moisture and adhesive from the absorbent pad. The right anterior tibial wound is shallower. The right medial leg wound continues to improve. It is smaller, still with rubbery slough on the surface. No malodor or purulent drainage. 08/11/2022: The dorsal foot wound is about the same size. It is a little bit dry with some slough accumulation. The right anterior tibial wound is also unchanged. The right medial leg wound is filling in further with good granulation tissue. Still with some slough buildup on the surface. 08/18/2022: His left leg is bright red, hot, and swollen with cellulitis. The dorsal foot wound actually looks quite good and is superficial with just a little bit of slough accumulation. The right anterior tibial wound is unchanged. The right medial leg wound has filled in with good granulation tissue but has copious amounts of drainage. 08/25/2022: His cellulitis has responded nicely to the oral antibiotics. His dorsal foot wound is smaller but has a fair amount of slough  buildup. The right anterior tibial wound remains stable and unchanged. The right medial leg wound has more granulation tissue under a layer of slough, but continues to have copious drainage. The drainage is actually causing skin irritation and threatens to result in breakdown. 09/01/2022: He continues to have profuse drainage from his wounds which is resulting in tissue maceration on both the dorsum of his left foot and the periwound distal to his medial leg wound. The dorsal foot wound is nearly healed, despite this. The medial right leg wound is filling in with good granulation tissue. We are working on getting home health assistance to change his dressings more frequently to try and control the drainage better. 09/08/2022: We have had success in controlling his drainage. The tissue maceration has improved significantly and his swelling has come down quite markedly. The  medial right leg wound is much more superficial and has substantially less nonviable tissue present. The anterior tibial wound is a little bit larger, however with some nonviable tissue present. 09/15/2022: The dorsal foot wound is nearly closed. The anterior tibial wound measures slightly larger today. The medial right leg wound continues to improve quite dramatically with less nonviable tissue and more robust-looking granulation. Edema control is significantly better. 09/22/2022: The left dorsal foot wound remains open, albeit quite tiny. The anterior tibial wound is a little bit larger again today. The medial right leg wound continues to improve with an increase in robust and healthy granulation tissue and less accumulation of nonviable tissue. 09/29/2022: His home health providers wrapped his left leg extremely tightly and he ended up having to cut the wrap off of his foot. This resulted in his foot being very swollen and that the dorsal foot wound is a little bit larger today. The anterior tibial wound is about the same size with a  little slough on the surface. The medial right leg wound continues to fill in. There are very few areas with any significant depth. There is still slough accumulation. 3/13; Patient has a left dorsal foot wound along with a right anterior tibial wound and right medial wound. We are using silver alginate to the left dorsal foot wound, Santyl and sodium thiosulfate compound ointment to the right lower extremity wounds under 4 layer compression. Patient has no issues or complaints today. 10/13/2022: The left dorsal foot wound is a little bit bigger today, but his foot is also a little bit more swollen. The right medial leg wound has filled in more and has substantially less depth. There is still some nonviable tissue present. The small anterior lower leg wound looks about the same. 10/20/2022: The left dorsal foot wound is epithelializing. There does not appear to be much open at this site. There is some eschar around the edges. The right medial leg wound continues to fill in with granulation tissue. The small anterior lower leg wound is a little bit bigger and there is a chunk of calcium protruding. He continues to have fairly copious drainage from the right leg. 10/27/2022: His home health agency failed to show up at all this past week and as a result, the excessive drainage from his right leg wound has resulted in tissue breakdown. His leg is red and irritated. The wounds themselves look about the same. The wound on his left dorsal foot is nearly closed underneath some eschar. 11/03/2022: We continue to have difficulty with his home health agency. He continues to have significant drainage from his right leg wound which has resulted in even further tissue breakdown. He also was not taking his Lasix as prescribed and I think much of the drainage has been his lymphedema fluid choosing the path of least resistance. The wound on his left dorsal foot is closed, but in the process of cleaning the site, dry skin  was pulled off and he has a new small superficial wound just adjacent to where the dorsal foot wound was. 11/12/2022: His left dorsal foot is completely healed. The right medial leg continues to deteriorate. He is not taking his Lasix as prescribed, nor is he using his lymphedema pumps at all. As a result, he has massive amounts of drainage saturating his dressings and causing further tissue breakdown on his medial leg. Today, his dressings had a foul odor, as well. 11/19/2022: His right medial leg looks even worse today. There is more  erythema and moisture related tissue breakdown. The 2 anterior wounds look a little bit better. The culture that I took last week did produce Pseudomonas and he is currently taking levofloxacin for this. 11/26/2022: There has been significant improvement to his right medial leg. It is far less raw and many of the superficial open areas have epithelialized. He continues to cut the foot portion of his wrap and as result his foot is bulbous and swollen compared to the rest of his leg, where edema control is good. The smaller of the 2 anterior tibial wound is closed, while the larger is contracting somewhat with a bit of slough at the base. 12/03/2022: His leg continues to demonstrate improvement. There has been epithelialization of much of the denuded area. He has a new small anterior tibial wound that has opened around a nidus of calcium. Edema control is improved. 12/10/2022: Continued improvement in the periwound skin. The anterior tibial wounds are about the same, but the medial leg wound has improved further. There is less nonviable tissue present. 12/17/2022: Between his visit on Tuesday with the nurse and today, he has had significantly more drainage which has resulted in some periwound tissue maceration. I suspect this correlates somewhat with his diuretic use, as he says he takes his diuretic religiously on Saturday, Sunday, and Monday and then does not take it after  Tuesday due to being out and about and not being close to easy restroom access. I suspect the excess fluid is just simply draining out of his leg. AKIEL, MARES ([id number]) [id number] Page 11 of 16 12/21/2022: This was supposed to be a nurse visit, but his leg has broken down so significantly, that we have converted to a wound care visit. He did take his diuretics over the weekend, but despite this, he has had copious drainage and all of his tissues have broken down substantially from the right medial leg ulcer all the way down to his ankle and even wrapping posterior to this. There is blue-green staining on his dressing, suggesting ongoing presence of Pseudomonas aeruginosa. 12/24/2022: The wound already looks better than it did 2 days ago. He has been taking ciprofloxacin and the Urgo clean dressing we applied did a much better job of wicking the drainage away from his leg. There is slough on the entire surface of the medial leg wound, but the erythema and induration has improved in the past couple of days. 12/31/2022: Continued improvement of the wound. He says that the pain has basically abated since being on ciprofloxacin. Still with fairly heavy drainage and slough accumulation. 01/07/2023: Overall continued improvement. Still with heavy slough buildup, but less drainage. 01/14/2023: There is a little bit of improvement. The skin is still looking a bit excoriated. He has a few more days left of ciprofloxacin. 01/21/2023: There has been significant improvement. The periwound skin is in much better condition. The ulcers are shallower and actually have some good granulation tissue present under some slough. 01/28/2023: The wound continues to improve. All of the open areas measured smaller today. There is slough accumulation on the surfaces. His drainage has decreased markedly. 02/04/2023: Continued improvement in the wound. Drainage remains significantly diminished. Slough  accumulation is present but to a lesser extent. Periwound skin continues to improve. Edema control is good. 02/18/2023: The heavy drainage persists, although it is was not as significant today as it was at his nurse visit on Tuesday. The drainage is yellow and does not have the blue-green discoloration suggestive of Pseudomonas. The actual  wounds on his leg are getting smaller, but the periwound skin is quite excoriated. 02/25/2023: His periwound skin is quite excoriated and he has a lot of clear yellow drainage. The main wounds are filling in further and epithelializing. 03/04/2023: The excoriation of the periwound skin has improved. Most of that area has dried up. The main wounds have less slough in them today. 03/11/2023: The main wounds have accumulated a little bit of slough. The periwound excoriation continues to improve. Unfortunately, he suffered a fall over the previous weekend and has abrasions on his knees bilaterally and on his right wrist and upper arm. In addition, the skin on his left lower leg apparently blistered and opened, leaving several superficial wounds on this extremity. He has been approved for the [facility] and Press wound VAC and has the equipment with him today. 03/18/2023: He has extensive excoriation to the periwound skin on the right medial lower leg. The actual wounds themselves are smaller with slough accumulation. The top of his left foot has begun weeping again. Using the [facility] and Press wound VAC, he says that he through 4 canisters. He seems to be very fluid overloaded, without any significant dyspnea. 03/25/2023: The wound to his left anterior tibial surface has healed. The left dorsal foot wound has gotten smaller and has a small area of hypertrophic granulation tissue present. The periwound skin on the right medial lower leg looks slightly better, but still appears excoriated. The actual wounds continue to contract but are still accumulating quite a bit of slough. The tiny  right anterior leg wounds are contracting. 04/01/2023: The left dorsal foot wound got a little bit bigger this week. There is slough accumulation. The periwound skin on the right medial lower leg continues to improve. The main medial right lower leg wounds have a dark gray discoloration and there is a strong odor coming from them. The back of his right lower leg just above the ankle also has a little bit of new breakdown where he is draining edema fluid. The small anterior leg wounds are stable. 04/08/2023: Since starting antibiotics last week, there has been tremendous improvement. The left dorsal foot wound is nearly closed with just a little bit of eschar on the surface. The right medial lower leg is less excoriated and red and he has had substantially less drainage. The amount of slough on the open areas of his wounds has decreased markedly and there is no longer any odor. The back of his right lower leg that was newly open last week has dried up, but is not completely closed yet. 04/15/2023: The improvement continues with significantly less drainage, resulting in less excoriation and irritation. The back of his right lower leg is closed, as is the left dorsal foot wound. The open areas on the right medial leg are smaller. 04/22/2023: His drainage has diminished significantly. The tissue maceration and excoriation surrounding the right medial leg wound has also improved substantially. There is slough on all of the open wound surfaces. He does have a bit of an odor coming from his wound today, but there is no purulent drainage nor any significant discoloration of the drainage in his VAC. 04/29/2023: He has a new wound on his left lower anterior tibial surface. This was a blister that came up and then ruptured. There is thin slough on the wound surface but no concern for infection. He has had significantly diminished drainage from his right medial leg ulcers. He was previously going through 4 canisters a  day  secondary to drainage and this week, he has used only 1 for the entire week. The open areas on his right medial leg are smaller and fewer in number. There is slough on all of the surfaces. No malodor appreciated. 05/06/2023: The wound on his left anterior tibial surface has epithelialized significantly. There is just a small area open in the center. The wound is clean without any slough or other debris accumulation. The drainage on the right leg has almost completely dry. There is slough on the open wound surfaces with significantly more epithelialized between the open sites. He continues on levofloxacin. 05/13/2023: His left lower leg wound is healed. The right lower leg wound continues to improve. The drainage has been minimal and he has not been in the wound VAC for over a week now. There is better skin filling between the open areas and less slough accumulation on the open surfaces. Patient History Information obtained from Patient, Chart. Family History Cancer - Mother, Heart Disease - Father,Mother, Hypertension - Father,Siblings, Lung Disease - Siblings, No family history of Diabetes, Hereditary Spherocytosis, Kidney Disease, Seizures, Stroke, Thyroid Problems, Tuberculosis. Social History Former smoker - quit 1991, Marital Status - Married, Alcohol Use - Moderate, Drug Use - No History, Caffeine Use - Daily - coffee. Medical History Eyes Patient has history of Cataracts - right eye removed Denies history of Glaucoma, Optic Neuritis Ear/Nose/Mouth/Throat Denies history of Chronic sinus problems/congestion, Middle ear problems Hematologic/Lymphatic Patient has history of Anemia - macrocytic, Lymphedema Conway, Dagoberto ([id number]) [id number] Page 12 of 16 Cardiovascular Patient has history of Congestive Heart Failure, Deep Vein Thrombosis - left leg, Hypertension, Peripheral Venous Disease Endocrine Denies history of Type I Diabetes, Type II  Diabetes Genitourinary Denies history of End Stage Renal Disease Integumentary (Skin) Denies history of History of Burn Oncologic Denies history of Received Chemotherapy, Received Radiation Psychiatric Denies history of Anorexia/bulimia, Confinement Anxiety Hospitalization/Surgery History - cervical fusion. - lumbar surgery. - coronary stent placement. - lasik eye surgery. - hydrocele excision. Medical A Surgical History Notes nd Constitutional Symptoms (General Health) obesity Ear/Nose/Mouth/Throat hard of hearing Respiratory frozen diaphragm right Cardiovascular hyperlipidemia Musculoskeletal myositis, lumbar DDD, spinal stenosis, cervical facet joint syndrome Objective Constitutional no acute distress. Vitals Time Taken: 11:00 AM, Height: 71 in, Weight: 267 lbs, BMI: 37.2, Temperature: 97.9 F, Pulse: 69 bpm, Respiratory Rate: 18 breaths/min, Blood Pressure: 139/68 mmHg. Respiratory Normal work of breathing on room air. General Notes: 05/13/2023: His left lower leg wound is healed. The right lower leg wound continues to improve. The drainage has been minimal and he has not been in the wound VAC for over a week now. There is better skin filling between the open areas and less slough accumulation on the open surfaces. Integumentary (Hair, Skin) Wound #10 status is Healed - Epithelialized. Original cause of wound was Blister. The date acquired was: 04/26/2023. The wound has been in treatment 2 weeks. The wound is located on the Left,Anterior Lower Leg. The wound measures 0cm length x 0cm width x 0cm depth; 0cm^2 area and 0cm^3 volume. There is no tunneling or undermining noted. There is a none present amount of drainage noted. There is no granulation within the wound bed. There is no necrotic tissue within the wound bed. The periwound skin appearance had no abnormalities noted for texture. The periwound skin appearance had no abnormalities noted for moisture. The periwound skin  appearance exhibited: Hemosiderin Staining. The periwound skin appearance did not exhibit: Atrophie Blanche, Cyanosis, Ecchymosis, Erythema. Periwound temperature was noted as No  Abnormality. Wound #3 status is Open. Original cause of wound was Gradually Appeared. The date acquired was: 12/07/2021. The wound has been in treatment 65 weeks. The wound is located on the Right,Medial Lower Leg. The wound measures 7.3cm length x 5.8cm width x 0.3cm depth; 33.254cm^2 area and 9.976cm^3 volume. There is Fat Layer (Subcutaneous Tissue) exposed. There is no tunneling or undermining noted. There is a medium amount of serosanguineous drainage noted. The wound margin is flat and intact. There is small (1-33%) red granulation within the wound bed. There is a large (67-100%) amount of necrotic tissue within the wound bed including Eschar and Adherent Slough. The periwound skin appearance had no abnormalities noted for texture. The periwound skin appearance had no abnormalities noted for moisture. The periwound skin appearance exhibited: Hemosiderin Staining, Rubor. The periwound skin appearance did not exhibit: Erythema. Periwound temperature was noted as No Abnormality. The periwound has tenderness on palpation. Wound #6 status is Open. Original cause of wound was Gradually Appeared. The date acquired was: 05/12/2022. The wound has been in treatment 52 weeks. The wound is located on the Right,Anterior Lower Leg. The wound measures 0.3cm length x 0.3cm width x 0.3cm depth; 0.071cm^2 area and 0.021cm^3 volume. There is Fat Layer (Subcutaneous Tissue) exposed. There is no tunneling or undermining noted. There is a small amount of serous drainage noted. The wound margin is epibole. There is no granulation within the wound bed. There is no necrotic tissue within the wound bed. The periwound skin appearance had no abnormalities noted for texture. The periwound skin appearance had no abnormalities noted for moisture. The  periwound skin appearance exhibited: Hemosiderin Staining. Periwound temperature was noted as No Abnormality. 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 There was a Selective/Open Wound Skin/Epidermis Debridement with a total area of 13.29 sq cm performed by Duanne Guess, MD. With the following instrument(s): Curette to remove Non-Viable tissue/material. Material removed includes Eschar, Slough, and Skin: Epidermis after achieving pain control using Lidocaine 4% Topical Solution. No specimens were taken. A time out was conducted at 11:35, prior to the start of the procedure. A Minimum amount of bleeding was controlled with Pressure. The procedure was tolerated well with a pain level of 2 throughout and a pain level of 0 following the procedure. Post Debridement Measurements: 7.3cm length x 5.8cm width x 0.4cm depth; 13.302cm^3 volume. Character of Wound/Ulcer Post Debridement is improved. Post procedure Diagnosis Wound #3: Same as Pre-Procedure Pre-procedure diagnosis of Wound #3 is a Lymphedema located on the Right,Medial Lower Leg . There was a Three Layer Compression Therapy Procedure by Zenaida Deed, RN. Post procedure Diagnosis Wound #3: Same as Pre-Procedure Plan Follow-up Appointments: Return Appointment in 1 week. - Dr. [name] RM 1 Friday 05/20/2023 @ 1100 Anesthetic: (In clinic) Topical Lidocaine 4% applied  to wound bed Bathing/ Shower/ Hygiene: May shower with protection but do not get wound dressing(s) wet. Protect dressing(s) with water repellant cover (for example, large plastic bag) or a cast cover and may then take shower. Negative Presssure Wound Therapy: Discontinue wound vac. Call the number on the  machine for the company to pick up. Edema Control - Lymphedema / SCD / Other: Lymphedema Pumps. Use Lymphedema pumps on leg(s) 2-3 times a day for 45-60 minutes. If wearing any wraps or hose, do not remove them. Continue exercising as instructed. Elevate legs to the level of the heart or above for 30 minutes daily and/or when sitting for 3-4 times a day throughout the day. - throughout the day Avoid standing for long periods of time. Exercise regularly WOUND #3: - Lower Leg Wound Laterality: Right, Medial Peri-Wound Care: Skin Prep 1 x Per Week/30 Days Discharge Instructions: cavelon to macerated wound margins Peri-Wound Care: Sween Lotion (Moisturizing lotion) 1 x Per Week/30 Days Discharge Instructions: Apply moisturizing lotion as directed Prim Dressing: Maxorb Extra Ag+ Alginate Dressing, 4x4.75 (in/in) 1 x Per Week/30 Days ary Discharge Instructions: Apply to excoriated weeping areas Secondary Dressing: ABD Pad, 8x10 1 x Per Week/30 Days Discharge Instructions: Apply over primary dressing as directed. Com pression Wrap: ThreePress (3 layer compression wrap) 1 x Per Week/30 Days Discharge Instructions: Apply three layer compression as directed. WOUND #6: - Lower Leg Wound Laterality: Right, Anterior Peri-Wound Care: Sween Lotion (Moisturizing lotion) 1 x Per Week/30 Days Discharge Instructions: Apply moisturizing lotion as directed Prim Dressing: Maxorb Extra Ag+ Alginate Dressing, 2x2 (in/in) 1 x Per Week/30 Days ary Discharge Instructions: Apply to wound bed as instructed Secondary Dressing: ABD Pad, 8x10 1 x Per Week/30 Days Discharge Instructions: Apply over primary dressing as  directed. Com pression Wrap: ThreePress (3 layer compression wrap) 1 x Per Week/30 Days Discharge Instructions: Apply three layer compression as directed. 05/13/2023: His left lower leg wound is healed. The right lower leg wound continues to improve. The drainage has been minimal and he has not been in the wound VAC for over a week now. There is better skin filling between the open areas and less slough accumulation on the open surfaces. I used a curette to debride slough, eschar, and dry skin from his right medial lower leg. We will continue silver alginate with 3 layer compression. He will continue on oral levofloxacin, as this seems to be the only intervention that is effective in keeping his drainage to a minimum. Follow-up in 1 week. Electronic Signature(s) Signed: 05/13/2023 12:14:47 PM By: Duanne Guess MD FACS Entered By: Duanne Guess on 05/13/2023 09:14:47 [name] ([id number]) [id number] Page 14 of 16 -------------------------------------------------------------------------------- HxROS Details Patient Name: Date of Service: [name], HAGOPIAN 05/13/2023 11:00 A M Medical Record Number: [id number] Patient Account Number: [id number] Date of Birth/Sex: Treating RN: March 18, 1951 (72 y.o. M) Primary Care Provider: [name] Other Clinician: Referring Provider: Treating Provider/Extender: [name] in Treatment: 9 Information Obtained From Patient Chart Constitutional Symptoms (General Health) Medical History: Past Medical History Notes: obesity Eyes Medical History: Positive for: Cataracts - right eye removed Negative for: Glaucoma; Optic Neuritis Ear/Nose/Mouth/Throat Medical History: Negative for: Chronic sinus problems/congestion; Middle ear problems Past Medical History Notes: hard of hearing Hematologic/Lymphatic Medical History: Positive for: Anemia - macrocytic; Lymphedema Respiratory Medical  History: Past Medical History Notes: frozen diaphragm right Cardiovascular Medical History: Positive for: Congestive Heart Failure; Deep Vein Thrombosis - left leg; Hypertension; Peripheral Venous Disease Past Medical History Notes: hyperlipidemia Endocrine Medical History: Negative for: Type I Diabetes; Type II Diabetes Genitourinary Medical History: Negative for: End Stage Renal Disease Integumentary (Skin) Medical History: Negative for: History of Burn Musculoskeletal Medical History: Past Medical History Notes: myositis, lumbar DDD, spinal stenosis, cervical facet joint syndrome Oncologic Medical History: KHAYMAN, VIEYRA (  [id number]) [id number] Page 15 of 16 Negative for: Received Chemotherapy; Received Radiation Psychiatric Medical History: Negative for: Anorexia/bulimia; Confinement Anxiety HBO Extended History Items Eyes: Cataracts Immunizations Pneumococcal Vaccine: Received Pneumococcal Vaccination: Yes Received Pneumococcal Vaccination On or After 60th Birthday: Yes Implantable Devices No devices added Hospitalization / Surgery History Type of Hospitalization/Surgery cervical fusion lumbar surgery coronary stent placement lasik eye surgery hydrocele excision Family and Social History Cancer: Yes - Mother; Diabetes: No; Heart Disease: Yes - Father,Mother; Hereditary Spherocytosis: No; Hypertension: Yes - Father,Siblings; Kidney Disease: No; Lung Disease: Yes - Siblings; Seizures: No; Stroke: No; Thyroid Problems: No; Tuberculosis: No; Former smoker - quit 1991; Marital Status - Married; Alcohol Use: Moderate; Drug Use: No History; Caffeine Use: Daily - coffee; Financial Concerns: No; Food, Clothing or Shelter Needs: No; Support System Lacking: No; Transportation Concerns: No Electronic Signature(s) Signed: 05/13/2023 12:31:34 PM By: Duanne Guess MD FACS Entered By: Duanne Guess on 05/13/2023  09:13:10 -------------------------------------------------------------------------------- SuperBill Details Patient Name: Date of Service: CAISYN, CAPUTO 05/13/2023 Medical Record Number: [id number] Patient Account Number: [id number] Date of Birth/Sex: Treating RN: [birth year] (72 y.o. 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

## 2023-05-17 ENCOUNTER — Ambulatory Visit (HOSPITAL_BASED_OUTPATIENT_CLINIC_OR_DEPARTMENT_OTHER): Payer: Medicare Other | Admitting: General Surgery

## 2023-05-17 DIAGNOSIS — I2510 Atherosclerotic heart disease of native coronary artery without angina pectoris: Secondary | ICD-10-CM | POA: Diagnosis not present

## 2023-05-17 DIAGNOSIS — R002 Palpitations: Secondary | ICD-10-CM | POA: Diagnosis not present

## 2023-05-20 ENCOUNTER — Encounter (HOSPITAL_BASED_OUTPATIENT_CLINIC_OR_DEPARTMENT_OTHER): Payer: Medicare Other | Admitting: General Surgery

## 2023-05-20 DIAGNOSIS — L97812 Non-pressure chronic ulcer of other part of right lower leg with fat layer exposed: Secondary | ICD-10-CM | POA: Diagnosis not present

## 2023-05-20 DIAGNOSIS — D649 Anemia, unspecified: Secondary | ICD-10-CM | POA: Diagnosis not present

## 2023-05-20 DIAGNOSIS — L97822 Non-pressure chronic ulcer of other part of left lower leg with fat layer exposed: Secondary | ICD-10-CM | POA: Diagnosis not present

## 2023-05-20 DIAGNOSIS — I739 Peripheral vascular disease, unspecified: Secondary | ICD-10-CM | POA: Diagnosis not present

## 2023-05-20 DIAGNOSIS — I890 Lymphedema, not elsewhere classified: Secondary | ICD-10-CM | POA: Diagnosis not present

## 2023-05-20 DIAGNOSIS — I509 Heart failure, unspecified: Secondary | ICD-10-CM | POA: Diagnosis not present

## 2023-05-20 DIAGNOSIS — I110 Hypertensive heart disease with heart failure: Secondary | ICD-10-CM | POA: Diagnosis not present

## 2023-05-20 NOTE — Progress Notes (Signed)
HARLOW, MCDERMOTT ([id number]) [id number] Page 1 of 16 Visit Report for 05/20/2023 Chief Complaint Document Details Patient Name: Date of Service: [name], [name] 05/20/2023 11:00 A M Medical Record Number: [id number] Patient Account Number: [id number] Date of Birth/Sex: Treating RN: 1951-07-06 (72 y.o. M) Primary Care Provider: [name] Other Clinician: Referring Provider: Treating Provider/Extender: [name] in Treatment: 66 Information Obtained from: Patient Chief Complaint Patient seen for complaints of Non-Healing Wounds. Electronic Signature(s) Signed: 05/20/2023 12:14:25 PM By: Duanne Guess MD FACS Entered By: Duanne Guess on 05/20/2023 09:14:25 -------------------------------------------------------------------------------- Debridement Details Patient Name: Date of Service: [name] 05/20/2023 11:00 A M Medical Record Number: [id number] Patient Account Number: [id number] Date of Birth/Sex: Treating RN: Mar 30, 1951 (72 y.o. [name] Primary Care Provider: [name] Other Clinician: Referring Provider: Treating Provider/Extender: [name] in Treatment: 66 Debridement Performed for Assessment: Wound #3 Right,Medial Lower Leg Performed By: Physician Duanne Guess, MD The following information was scribed by: [name] The information was scribed for: Duanne Guess Debridement Type: Debridement Level of Consciousness (Pre-procedure): Awake and Alert Pre-procedure Verification/Time Out Yes - 11:27 Taken: Start Time: 11:27 Pain Control: Lidocaine 5% topical ointment Percent of Wound Bed Debrided: 80% T Area Debrided (cm): otal 29.14 Tissue and other material debrided: Non-Viable, Eschar, Slough, Slough Level: Non-Viable Tissue Debridement Description: Selective/Open Wound Instrument: Curette Bleeding: Minimum Hemostasis Achieved:  Pressure Response to Treatment: Procedure was tolerated well Level of Consciousness (Post- Awake and Alert procedure): Post Debridement Measurements of Total Wound Length: (cm) 8 Width: (cm) 5.8 Depth: (cm) 0.3 Volume: (cm) 10.933 Character of Wound/Ulcer Post Debridement: Improved Post Procedure Diagnosis JARAMIAH, [name] ([id number]) [id number] Page 2 of 16 Same as Pre-procedure Electronic Signature(s) Signed: 05/20/2023 11:55:59 AM By: [name] Signed: 05/20/2023 12:25:45 PM By: Duanne Guess MD FACS Entered By: [name] on 05/20/2023 08:31:51 -------------------------------------------------------------------------------- HPI Details Patient Name: Date of Service: [name] 05/20/2023 11:00 A M Medical Record Number: [id number] Patient Account Number: [id number] Date of Birth/Sex: Treating RN: 1950/12/10 (72 y.o. M) Primary Care Provider: [name] Other Clinician: Referring Provider: Treating Provider/Extender: [name] in Treatment: 74 History of Present Illness HPI Description: ADMISSION 02/09/2022 This is a 72 year old man who recently moved to [place] from [place]. He has a history of of coronary artery disease and prior left popliteal vein DVT . He is not diabetic and does not smoke. 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 He has had multiple spinal surgeries and has limited mobility. He primarily uses a motorized wheelchair. He has had lymphedema for many years and does have lymphedema pumps although he says he does not use them frequently. He has wounds on his bilateral lower extremities. He was receiving care in [place] for these prior to his move. They were apparently packing his wounds with Betadine soaked gauze. He has worn compression wraps in the past but not recently. He did say that they helped tremendously. In clinic today, his right ABI is  noncompressible but has good signals; the left ABI was 1.17. No formal vascular studies have been performed. On his left dorsal foot, there is a circular lesion that exposes the fat layer. There is fairly heavy slough accumulation within the wound, but no significant odor. On his right medial calf, he has multiple pinholes that have slough buildup in them and probe for about 2 to 4 mm in depth. They are draining clear fluid. On his right lateral midfoot, he has  ulceration consistent with venous stasis. It is geographic and has slough accumulation. 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 02/17/2022: All of the wounds are little bit larger today and have more slough accumulation. Today, the medial calf openings are larger and probe to something that feels calcified. There is odor coming from his wounds. His vascular studies are scheduled for August 9 and 10. 02-24-2022 upon evaluation today patient presents for follow-up concerning his ongoing issues here with his lower extremities. With that being said he does have significant problems with what appears to be cellulitis unfortunately the PCR culture that was sent last week got crushed by [employer] in transit. With that being said we are going to reobtain a culture today although I am actually to do it on the right medial leg as this seems to be the most inflamed area today where there is some questionable drainage as well. I am going to remove some of the calcium deposits which are loosening obtain a deeper culture from this area. Fortunately I do not see any evidence of active infection systemically which is good news but locally I think we are having a bigger issue here. He does have his appointment next Thursday with vascular. Patient has also been referred to [facility] for further evaluation and treatment of the calcinosis for possible sulfate  injections. 03/04/2022: The culture that was performed last week grew out multiple species including Klebsiella, Morganella, and Pseudomonas aeruginosa. He had been prescribed Bactrim but this was not adequate to cover all of the species and levofloxacin was recommended. He completed the Bactrim but did not pick up the levofloxacin and begin taking it until yesterday. 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 He had arterial studies performed today. He does have evidence of peripheral arterial disease. Results copied here: +-------+-----------+-----------+------------+------------+ ABI/TBIT oday's ABIT oday's TBIPrevious ABIPrevious TBI +-------+-----------+-----------+------------+------------+ Right Twilight 0.75    +-------+-----------+-----------+------------+------------+ Left  0.57    +-------+-----------+-----------+------------+------------+ Arterial wall calcification precludes accurate ankle pressures and ABIs. Summary: Right: Resting right ankle-brachial index indicates noncompressible right lower extremity arteries. The right toe-brachial index is normal. Left: Resting left ankle-brachial index indicates noncompressible left lower extremity arteries. The left toe-brachial index is abnormal. He continues to have further tissue breakdown at the right medial calf and there is ample slough accumulation along with necrotic subcutaneous tissue. The left dorsal foot wound has built up slough, as well. The right medial foot wound is nearly closed with just a light layer of eschar over the surface. The right dorsal lateral foot wound has also accumulated a fair amount of slough and remains quite tender. 03/10/2022: He has been taking the prescribed levofloxacin and has about 3 days left of this. The erythema and  induration on his right leg has improved. He KESTON, BIRDEN ([id number]) [id number] Page 3 of 16 continues to experience further breakdown of the right medial calf wound. There is extensive slough and debris accumulation there. There is heavy slough on his left dorsal foot wound, but no odor or purulent drainage. The right medial foot wound appears to be closed. The right dorsal lateral foot wound also has a fair amount of slough but it is less tender than at our last visit. He still does not have an appointment with dermatology. 03/22/2022: The right anterior tibial  wound has closed. The right lateral foot wound is nearly closed with just a little bit of slough. The left dorsal foot wound is smaller and continues to have some slough buildup but less so than at prior visits. There is good granulation tissue underlying the slough. Unfortunately the right medial calf wound continues to deteriorate. It has a foul odor today and a lot of necrotic tissue, the surrounding skin is also erythematous and moist. 03/30/2022: The right lateral foot wound continues to contract and just has a little bit of slough and eschar present. The left dorsal foot wound is also smaller with slough overlying good granulation tissue. The right medial calf wound actually looks quite a bit better today; there is no odor and there is much less necrotic tissue although some remains present. We have been using topical gentamicin and he has been taking oral Augmentin. 04/07/2022: The patient saw dermatology at [facility] last week. Unfortunately, it appears that they did not read any of the documentation that was provided. 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 Overall, it was a highly unsatisfactory consultation. In the meantime, however, we were able to identify compounding pharmacy who could create a topical sodium thiosulfate ointment. The patient has that with him today. Both of the right foot wounds are now closed. The left dorsal foot wound has contracted but still has a layer of slough on the surface. The medial calf wounds on the right all look a little bit better. They still have heavy slough along with the chunks of calcium. Everything is stained purple. 04/14/2022: The wound on his dorsal left foot is a little bit smaller again today. There is still slough accumulation on the surface. The medial calf wounds have quite a bit less slough accumulation today. His right leg, however, is warm and erythematous, concerning for cellulitis. 04/14/2022: He completed his course of Augmentin yesterday. The medial calf wound sites are much cleaner today and shallower. There is less fragmented calcium in the wounds. He has a lot of lymphedema fluid-like drainage from these wounds, but no purulent discharge or malodor. The wound on his dorsal left foot has also contracted and is shallower. There is a layer of slough over healthy granulation tissue. 05/05/2022: The left dorsal foot wound is smaller today with less slough and more granulation tissue. The right medial calf wounds are shallower, but have extensive slough and some fat necrosis. The drainage has diminished considerably, however. 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 05/12/2022: The left dorsal foot wound continues to contract and fill with granulation tissue. There is slough on the wound surface. The right medial calf wounds also continue to fill with healthy tissue, but there is remaining slough and fat necrosis present. He had a significant increase in his  drainage this week and it was a bright yellow-green color. He also had a new wound open over his right anterior tibial surface associated with the spike of cutaneous calcium. The leg is more red, as well, concerning for infection. 05/19/2022: His culture returned positive for Pseudomonas. He is currently taking a course of oral levofloxacin. We have also been using  topical gentamicin mixed in with his sodium thiosulfate. All of the wounds look better this week. 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 There is slough and nonviable subcutaneous tissue still present. The new anterior tibial wound just has a light layer of slough. The dorsal foot wound also has some slough present and appears a little dry today. 05/26/2022: The right medial leg wound continues to improve. It is feeling with granulation tissue, but still accumulates a fairly thick layer of slough on the surface. The more recent anterior tibial wound has remained quite small, but also has some slough on the surface. The left dorsal foot wound has contracted somewhat and has perimeter epithelialization. He completed his oral levofloxacin. 06/02/2022: The left dorsal foot wound continues to improve significantly. There is just a little slough on the wound surface. The right medial leg wound had black drainage, but it looks like silver alginate was applied last week and I theorized that silver sulfide was created with a combination of the silver alginate and the sodium thiosulfate. It did, however, have a bit of an odor to it and extensive slough accumulation. The small anterior tibial wound also had a bit of slough and nonviable subcutaneous tissue present. The skin of his leg was rather macerated, as well. 06/09/2022: The left dorsal foot wound is smaller again this week with just a light layer of slough on the surface. The right medial leg wound looks substantially better. The leg and periwound skin are no  longer red and macerated. There is good granulation tissue forming with less slough and nonviable tissue. The anterior tibial wound just has a small amount of slough accumulation. 06/16/2022: The left dorsal foot wound continues to contract. His wrap slipped a little bit, however, and his foot is more swollen. The right medial leg wound continues to improve. The underlying tissue is more vital-appearing and there is less slough. He does have substantial drainage. The small anterior tibial wound has a bit of slough accumulation. 06/23/2022: The left dorsal foot wound is about half the size as it was last week. There is just some light slough on the surface and some eschar buildup around the edges. The right anterior leg wound is small with a little slough on the surface. Unfortunately, the right medial leg wound deteriorated fairly substantially. It is malodorous with gray/green/black discoloration. 06/30/2022: The left dorsal foot wound continues to contract. It is nearly closed with just a light layer of slough present. The right anterior leg wound is about the same size and has a small amount of slough on the surface. The right medial leg wound looks a lot better than it did last week. The odor has abated and the tissue is less pulpy and necrotic-looking. There is still a fairly thick layer of slough present. 12/13; the left dorsal foot wound looks very healthy. There was no need to change the dressing here and no debridement. He has a small area on the right anterior lower leg apparently had not changed much in size. The area on the medial leg is the most problematic area this is large with some depth and a completely nonviable surface today. 07/14/2022: The left dorsal foot wound is smaller today with just a little slough and eschar present. The right anterior lower leg wound is about the same without any significant change. The medial leg wound has a black and green surface and is malodorous. No  purulent drainage and the periwound skin is intact. Edema  control is suboptimal. 07/21/2022: The dorsal foot wound continues to contract and is nearly closed with just a little bit of slough and eschar on the surface. The right anterior lower leg wound is shallower today but otherwise the dimensions are unchanged. The medial leg wound looks significantly better although it still has a nonviable surface; the odor and black/green surface has resolved. His culture came back with multiple species sensitive to Augmentin, which was prescribed and a very low level of Pseudomonas, which should be handled by the topical gentamicin we have been using. 07/28/2022: The dorsal foot wound is down to just a very tiny opening with a little eschar on the surface. The right anterior tibial wound is about the same with some slough buildup. The right medial leg wound looks quite a bit better this week. There is thick rubbery slough on the surface, but the odor and drainage has improved significantly. 08/04/2022: The dorsal foot wound is almost healed, but the periwound skin has broken down. This appears to be secondary to moisture and adhesive from the absorbent pad. The right anterior tibial wound is shallower. The right medial leg wound continues to improve. It is smaller, still with rubbery slough on the surface. No malodor or purulent drainage. 08/11/2022: The dorsal foot wound is about the same size. It is a little bit dry with some slough accumulation. The right anterior tibial wound is also unchanged. The right medial leg wound is filling in further with good granulation tissue. Still with some slough buildup on the surface. 08/18/2022: His left leg is bright red, hot, and swollen with cellulitis. The dorsal foot wound actually looks quite good and is superficial with just a little bit of slough accumulation. The right anterior tibial wound is unchanged. The right medial leg wound has filled in with good granulation  tissue but has copious amounts WILLIS, IRWIN ([id number]) [phone].pdf Page 4 of 16 of drainage. 08/25/2022: His cellulitis has responded nicely to the oral antibiotics. His dorsal foot wound is smaller but has a fair amount of slough buildup. The right anterior tibial wound remains stable and unchanged. The right medial leg wound has more granulation tissue under a layer of slough, but continues to have copious drainage. The drainage is actually causing skin irritation and threatens to result in breakdown. 09/01/2022: He continues to have profuse drainage from his wounds which is resulting in tissue maceration on both the dorsum of his left foot and the periwound distal to his medial leg wound. The dorsal foot wound is nearly healed, despite this. The medial right leg wound is filling in with good granulation tissue. We are working on getting home health assistance to change his dressings more frequently to try and control the drainage better. 09/08/2022: We have had success in controlling his drainage. The tissue maceration has improved significantly and his swelling has come down quite markedly. The medial right leg wound is much more superficial and has substantially less nonviable tissue present. The anterior tibial wound is a little bit larger, however with some nonviable tissue present. 09/15/2022: The dorsal foot wound is nearly closed. The anterior tibial wound measures slightly larger today. The medial right leg wound continues to improve quite dramatically with less nonviable tissue and more robust-looking granulation. Edema control is significantly better. 09/22/2022: The left dorsal foot wound remains open, albeit quite tiny. The anterior tibial wound is a little bit larger again today. The medial right leg wound continues to improve with an increase in robust and healthy granulation  tissue and less accumulation of nonviable tissue. 09/29/2022: His home health providers  wrapped his left leg extremely tightly and he ended up having to cut the wrap off of his foot. This resulted in his foot being very swollen and that the dorsal foot wound is a little bit larger today. The anterior tibial wound is about the same size with a little slough on the surface. The medial right leg wound continues to fill in. There are very few areas with any significant depth. There is still slough accumulation. 3/13; Patient has a left dorsal foot wound along with a right anterior tibial wound and right medial wound. We are using silver alginate to the left dorsal foot wound, Santyl and sodium thiosulfate compound ointment to the right lower extremity wounds under 4 layer compression. Patient has no issues or complaints today. 10/13/2022: The left dorsal foot wound is a little bit bigger today, but his foot is also a little bit more swollen. The right medial leg wound has filled in more and has substantially less depth. There is still some nonviable tissue present. The small anterior lower leg wound looks about the same. 10/20/2022: The left dorsal foot wound is epithelializing. There does not appear to be much open at this site. There is some eschar around the edges. The right medial leg wound continues to fill in with granulation tissue. The small anterior lower leg wound is a little bit bigger and there is a chunk of calcium protruding. He continues to have fairly copious drainage from the right leg. 10/27/2022: His home health agency failed to [name] up at all this past week and as a result, the excessive drainage from his right leg wound has resulted in tissue breakdown. His leg is red and irritated. The wounds themselves look about the same. The wound on his left dorsal foot is nearly closed underneath some eschar. 11/03/2022: We continue to have difficulty with his home health agency. He continues to have significant drainage from his right leg wound which has resulted in even further tissue  breakdown. He also was not taking his Lasix as prescribed and I think much of the drainage has been his lymphedema fluid choosing the path of least resistance. The wound on his left dorsal foot is closed, but in the process of cleaning the site, dry skin was pulled off and he has a new small superficial wound just adjacent to where the dorsal foot wound was. 11/12/2022: His left dorsal foot is completely healed. The right medial leg continues to deteriorate. He is not taking his Lasix as prescribed, nor is he using his lymphedema pumps at all. As a result, he has massive amounts of drainage saturating his dressings and causing further tissue breakdown on his medial leg. Today, his dressings had a foul odor, as well. 11/19/2022: His right medial leg looks even worse today. There is more erythema and moisture related tissue breakdown. The 2 anterior wounds look a little bit better. The culture that I took last week did produce Pseudomonas and he is currently taking levofloxacin for this. 11/26/2022: There has been significant improvement to his right medial leg. It is far less raw and many of the superficial open areas have epithelialized. He continues to cut the foot portion of his wrap and as result his foot is bulbous and swollen compared to the rest of his leg, where edema control is good. The smaller of the 2 anterior tibial wound is closed, while the larger is contracting somewhat with a  bit of slough at the base. 12/03/2022: His leg continues to demonstrate improvement. There has been epithelialization of much of the denuded area. He has a new small anterior tibial wound that has opened around a nidus of calcium. Edema control is improved. 12/10/2022: Continued improvement in the periwound skin. The anterior tibial wounds are about the same, but the medial leg wound has improved further. There is less nonviable tissue present. 12/17/2022: Between his visit on Tuesday with the nurse and today, he has had  significantly more drainage which has resulted in some periwound tissue maceration. I suspect this correlates somewhat with his diuretic use, as he says he takes his diuretic religiously on Saturday, Sunday, and Monday and then does not take it after Tuesday due to being out and about and not being close to easy restroom access. I suspect the excess fluid is just simply draining out of his leg. 12/21/2022: This was supposed to be a nurse visit, but his leg has broken down so significantly, that we have converted to a wound care visit. He did take his diuretics over the weekend, but despite this, he has had copious drainage and all of his tissues have broken down substantially from the right medial leg ulcer all the way down to his ankle and even wrapping posterior to this. There is blue-green staining on his dressing, suggesting ongoing presence of Pseudomonas aeruginosa. 12/24/2022: The wound already looks better than it did 2 days ago. He has been taking ciprofloxacin and the Urgo clean dressing we applied did a much better job of wicking the drainage away from his leg. There is slough on the entire surface of the medial leg wound, but the erythema and induration has improved in the past couple of days. 12/31/2022: Continued improvement of the wound. He says that the pain has basically abated since being on ciprofloxacin. Still with fairly heavy drainage and slough accumulation. 01/07/2023: Overall continued improvement. Still with heavy slough buildup, but less drainage. 01/14/2023: There is a little bit of improvement. The skin is still looking a bit excoriated. He has a few more days left of ciprofloxacin. 01/21/2023: There has been significant improvement. The periwound skin is in much better condition. The ulcers are shallower and actually have some good granulation tissue present under some slough. 01/28/2023: The wound continues to improve. All of the open areas measured smaller today. There is  slough accumulation on the surfaces. His drainage has decreased markedly. 02/04/2023: Continued improvement in the wound. Drainage remains significantly diminished. Slough accumulation is present but to a lesser extent. Periwound skin continues to improve. Edema control is good. 02/18/2023: The heavy drainage persists, although it is was not as significant today as it was at his nurse visit on Tuesday. The drainage is yellow and does not NIVAN, MCKEAGUE ([id number]) [id number] Page 5 of 16 have the blue-green discoloration suggestive of Pseudomonas. The actual wounds on his leg are getting smaller, but the periwound skin is quite excoriated. 02/25/2023: His periwound skin is quite excoriated and he has a lot of clear yellow drainage. The main wounds are filling in further and epithelializing. 03/04/2023: The excoriation of the periwound skin has improved. Most of that area has dried up. The main wounds have less slough in them today. 03/11/2023: The main wounds have accumulated a little bit of slough. The periwound excoriation continues to improve. Unfortunately, he suffered a fall over the previous weekend and has abrasions on his knees bilaterally and on his right wrist and upper arm. In addition,  the skin on his left lower leg apparently blistered and opened, leaving several superficial wounds on this extremity. He has been approved for the [facility] and Press wound VAC and has the equipment with him today. 03/18/2023: He has extensive excoriation to the periwound skin on the right medial lower leg. The actual wounds themselves are smaller with slough accumulation. The top of his left foot has begun weeping again. Using the [facility] and Press wound VAC, he says that he through 4 canisters. He seems to be very fluid overloaded, without any significant dyspnea. 03/25/2023: The wound to his left anterior tibial surface has healed. The left dorsal foot wound has gotten smaller and has a small  area of hypertrophic granulation tissue present. The periwound skin on the right medial lower leg looks slightly better, but still appears excoriated. The actual wounds continue to contract but are still accumulating quite a bit of slough. The tiny right anterior leg wounds are contracting. 04/01/2023: The left dorsal foot wound got a little bit bigger this week. There is slough accumulation. The periwound skin on the right medial lower leg continues to improve. The main medial right lower leg wounds have a dark gray discoloration and there is a strong odor coming from them. The back of his right lower leg just above the ankle also has a little bit of new breakdown where he is draining edema fluid. The small anterior leg wounds are stable. 04/08/2023: Since starting antibiotics last week, there has been tremendous improvement. The left dorsal foot wound is nearly closed with just a little bit of eschar on the surface. The right medial lower leg is less excoriated and red and he has had substantially less drainage. The amount of slough on the open areas of his wounds has decreased markedly and there is no longer any odor. The back of his right lower leg that was newly open last week has dried up, but is not completely closed yet. 04/15/2023: The improvement continues with significantly less drainage, resulting in less excoriation and irritation. The back of his right lower leg is closed, as is the left dorsal foot wound. The open areas on the right medial leg are smaller. 04/22/2023: His drainage has diminished significantly. The tissue maceration and excoriation surrounding the right medial leg wound has also improved substantially. There is slough on all of the open wound surfaces. He does have a bit of an odor coming from his wound today, but there is no purulent drainage nor any significant discoloration of the drainage in his VAC. 04/29/2023: He has a new wound on his left lower anterior tibial surface.  This was a blister that came up and then ruptured. There is thin slough on the wound surface but no concern for infection. He has had significantly diminished drainage from his right medial leg ulcers. He was previously going through 4 canisters a day secondary to drainage and this week, he has used only 1 for the entire week. The open areas on his right medial leg are smaller and fewer in number. There is slough on all of the surfaces. No malodor appreciated. 05/06/2023: The wound on his left anterior tibial surface has epithelialized significantly. There is just a small area open in the center. The wound is clean without any slough or other debris accumulation. The drainage on the right leg has almost completely dry. There is slough on the open wound surfaces with significantly more epithelialized between the open sites. He continues on levofloxacin. 05/13/2023: His left lower  leg wound is healed. The right lower leg wound continues to improve. The drainage has been minimal and he has not been in the wound VAC for over a week now. There is better skin filling between the open areas and less slough accumulation on the open surfaces. 05/20/2023: The anterior leg wounds are closing up and there is no slough on the open areas that remain. His medial leg wound was measured longer today, but this appears to include some stuck on silver alginate and dry eschar, rather than the actual open wound areas. There is slough accumulation on the open surfaces. Edema control is good and his drainage has not been excessive. Electronic Signature(s) Signed: 05/20/2023 12:15:31 PM By: Duanne Guess MD FACS Entered By: Duanne Guess on 05/20/2023 09:15:31 -------------------------------------------------------------------------------- Physical Exam Details Patient Name: Date of Service: LOCKLAN, REILLEY [name] 05/20/2023 11:00 A M Medical Record Number: [id number] Patient Account Number: [id number] Date of Birth/Sex:  Treating RN: [birth year] (72 y.o. M) Primary Care Provider: [name] Other Clinician: Referring Provider: Treating Provider/Extender: [name] in Treatment: 3 Constitutional . . . . no acute distress. Respiratory Normal work of breathing on room air.. Notes 05/20/2023: The anterior leg wounds are closing up and there is no slough on the open areas that remain. His medial leg wound was measured longer today, but this appears to include some stuck on silver alginate and dry eschar, rather than the actual open wound areas. There is slough accumulation on the open surfaces. Edema control is good and his drainage has not been excessive. GARDELL, FARINO ([id number]) [id number] Page 6 of 16 Electronic Signature(s) Signed: 05/20/2023 12:16:32 PM By: Duanne Guess MD FACS Entered By: Duanne Guess on 05/20/2023 09:16:31 -------------------------------------------------------------------------------- Physician Orders Details Patient Name: Date of Service: [name] 05/20/2023 11:00 A M Medical Record Number: [id number] Patient Account Number: [id number] Date of Birth/Sex: Treating RN: Aug 15, 1950 (72 y.o. 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 - Dr. [name] RM 1 Return A Anesthetic (In clinic) Topical Lidocaine 4% applied to wound bed Bathing/ Shower/ Hygiene May shower with protection but do not get wound dressing(s) wet. Protect dressing(s) with water repellant cover (for example, large plastic bag) or a cast cover and may then take shower. Lymphedema Treatment Plan - Exercise, Compression and Elevation Compression Wraps as ordered Elevate legs 30 - 60 minutes at or above heart level at least 3 - 4 times daily as able/tolerated Avoid standing for long periods and elevate leg(s) parallel to the floor when sitting Wound Treatment Wound #3 - Lower Leg Wound Laterality: Right, Medial Cleanser: Soap and Water 1 x Per Week/30 Days Discharge Instructions: May shower and wash wound with dial antibacterial soap and water prior to dressing change. Cleanser: Wound Cleanser 1 x Per Week/30 Days Discharge Instructions: Cleanse the wound with wound cleanser prior to applying a clean dressing using gauze sponges, not tissue or cotton balls. Peri-Wound Care: Skin  Prep 1 x Per Week/30 Days Discharge Instructions: cavelon to macerated wound margins Peri-Wound Care: Sween Lotion (Moisturizing lotion) 1 x Per Week/30 Days Discharge Instructions: Apply moisturizing lotion as directed Prim Dressing: Maxorb Extra Ag+ Alginate Dressing, 4x4.75 (in/in) 1 x Per Week/30 Days ary Discharge Instructions: Apply to excoriated weeping areas Secondary Dressing: ABD Pad, 8x10 1 x Per Week/30 Days Discharge Instructions: Apply over primary dressing as directed. Compression Wrap: ThreePress (3 layer compression wrap) 1 x Per Week/30 Days Discharge Instructions: Apply three layer compression as directed. SUFIAN, JACHIM ([id number]) [id number] Page 7 of 16 Wound #6 - Lower Leg Wound Laterality: Right, Anterior Cleanser: Soap and Water 1 x Per Week/30  Days Discharge Instructions: May shower and wash wound with dial antibacterial soap and water prior to dressing change. Cleanser: Wound Cleanser 1 x Per Week/30 Days Discharge Instructions: Cleanse the wound with wound cleanser prior to applying a clean dressing using gauze sponges, not tissue or cotton balls. Peri-Wound Care: Sween Lotion (Moisturizing lotion) 1 x Per Week/30 Days Discharge Instructions: Apply moisturizing lotion as directed Prim Dressing: Maxorb Extra Ag+ Alginate Dressing, 2x2 (in/in) 1 x Per Week/30 Days ary Discharge Instructions: Apply to wound bed as instructed Secondary Dressing: ABD Pad, 8x10 1 x Per Week/30 Days Discharge Instructions: Apply over primary dressing as directed. Compression Wrap: ThreePress (3 layer compression wrap) 1 x Per Week/30 Days Discharge Instructions: Apply three layer compression as directed. Patient Medications llergies: No Known Allergies A Notifications Medication Indication Start End 05/20/2023 lidocaine DOSE topical 5 % ointment - ointment topical Electronic Signature(s) Signed: 05/20/2023 12:25:45 PM By: Duanne Guess MD FACS Previous Signature: 05/20/2023 11:55:59 AM Version By: [name] Entered By: Duanne Guess on 05/20/2023 09:16:48 -------------------------------------------------------------------------------- Problem List Details Patient Name: Date of Service: [name] 05/20/2023 11:00 A M Medical Record Number: [id number] Patient Account Number: [id number] Date of Birth/Sex: Treating RN: 1950-08-17 (72 y.o. 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 02/09/2022 No  Yes I89.0 Lymphedema, not elsewhere classified 02/09/2022 No Yes I87.2 Venous insufficiency (chronic) (peripheral) 02/09/2022 No Yes Z86.718 Personal history of other venous thrombosis and embolism 02/09/2022 No Yes GAUTHAM, ROOSEVELT ([id number]) [phone].pdf Page 8 of 16 I10 Essential (primary) hypertension 02/09/2022 No Yes I25.10 Atherosclerotic heart disease of native coronary artery without angina pectoris 02/09/2022 No Yes Inactive Problems ICD-10 Code Description Active Date Inactive Date L97.512 Non-pressure chronic ulcer of other part of right foot with fat layer exposed 02/17/2022 02/17/2022 Resolved Problems ICD-10 Code Description Active Date Resolved Date L97.822 Non-pressure chronic ulcer of other part of left lower leg with fat layer exposed 04/29/2023 04/29/2023 L97.821 Non-pressure chronic ulcer of other part of left lower leg limited to breakdown of skin 03/11/2023 03/11/2023 L98.492 Non-pressure chronic ulcer of skin of other sites with fat layer exposed 03/11/2023 03/11/2023 L97.521 Non-pressure chronic ulcer of other part of left foot limited to breakdown of skin 03/18/2023 02/09/2022 Electronic Signature(s) Signed: 05/20/2023 12:14:04 PM By: Duanne Guess MD FACS Entered By: Duanne Guess on 05/20/2023 09:14:04 -------------------------------------------------------------------------------- Progress Note Details Patient Name: Date of Service: [name] 05/20/2023 11:00 A M Medical Record Number: [id number] Patient Account Number: [id number] Date of Birth/Sex: Treating RN: 02/18/1951 (72 y.o. M) Primary Care Provider: [name] Other Clinician: Referring Provider: Treating Provider/Extender:  Duanne Guess Santo Held in Treatment: 66 Subjective Chief Complaint Information obtained from Patient Patient seen for complaints of Non-Healing Wounds. History of Present Illness (HPI) ADMISSION 02/09/2022 This is a 72 year old man  who recently moved to [place] from [place]. He has a history of of coronary artery disease and prior left popliteal vein DVT . He is not diabetic and does not smoke. 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 He has had multiple spinal surgeries and has limited mobility. He primarily uses a motorized wheelchair. He has had lymphedema for many years and does have lymphedema pumps although he says he does not use them frequently. He has wounds on his bilateral lower extremities. He was receiving care in [place] for these prior to his move. They were apparently packing his wounds with Betadine soaked gauze. He has worn compression wraps in the past but not recently. He did say that they helped tremendously. In clinic today, his right ABI is noncompressible but has good signals; the left ABI was 1.17. No formal vascular studies have been performed. On his left dorsal foot, there is a circular lesion that exposes the fat layer. There is fairly heavy slough accumulation within the wound, but no significant odor. On his right medial calf, he has multiple pinholes that have slough buildup in them and probe for about 2 to 4 mm in depth. They are draining clear fluid. On his right lateral midfoot, he has ulceration consistent with venous stasis. It is geographic and has slough accumulation. He has changes in his lower extremities ELSIE, REE ([id number]) [id number] Page 9 of 16 consistent with stasis dermatitis, but no hemosiderin deposition or thickening of the skin. 02/17/2022: All of the wounds are little bit larger today and have more slough accumulation. Today, the medial calf openings are larger and probe to something that feels calcified. There is odor coming from his wounds. His vascular studies are scheduled for August 9 and 10. 02-24-2022 upon evaluation today patient presents for follow-up concerning his ongoing issues here with his lower extremities. With  that being said he does have significant problems with what appears to be cellulitis unfortunately the PCR culture that was sent last week got crushed by [employer] in transit. With that being said we are going to reobtain a culture today although I am actually to do it on the right medial leg as this seems to be the most inflamed area today where there is some questionable drainage as well. I am going to remove some of the calcium deposits which are loosening obtain a deeper culture from this area. Fortunately I do not see any evidence of active infection systemically which is good news but locally I think we are having a bigger issue here. He does have his appointment next Thursday with vascular. Patient has also been referred to [facility] for further evaluation and treatment of the calcinosis for possible sulfate injections. 03/04/2022: The culture that was performed last week grew out multiple species including Klebsiella, Morganella, and Pseudomonas aeruginosa. He had been prescribed Bactrim but this was not adequate to cover all of the species and levofloxacin was recommended. He completed the Bactrim but did not pick up the levofloxacin and begin taking it until yesterday. 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 He had arterial studies performed today. He does have evidence of  peripheral arterial disease. Results copied here: +-------+-----------+-----------+------------+------------+ ABI/TBIT oday's ABIT oday's TBIPrevious ABIPrevious TBI +-------+-----------+-----------+------------+------------+ Right [place] 0.75    +-------+-----------+-----------+------------+------------+ Left [place] 0.57    +-------+-----------+-----------+------------+------------+ Arterial wall calcification precludes accurate ankle pressures  and ABIs. Summary: Right: Resting right ankle-brachial index indicates noncompressible right lower extremity arteries. The right toe-brachial index is normal. Left: Resting left ankle-brachial index indicates noncompressible left lower extremity arteries. The left toe-brachial index is abnormal. He continues to have further tissue breakdown at the right medial calf and there is ample slough accumulation along with necrotic subcutaneous tissue. The left dorsal foot wound has built up slough, as well. The right medial foot wound is nearly closed with just a light layer of eschar over the surface. The right dorsal lateral foot wound has also accumulated a fair amount of slough and remains quite tender. 03/10/2022: He has been taking the prescribed levofloxacin and has about 3 days left of this. The erythema and induration on his right leg has improved. He continues to experience further breakdown of the right medial calf wound. There is extensive slough and debris accumulation there. There is heavy slough on his left dorsal foot wound, but no odor or purulent drainage. The right medial foot wound appears to be closed. The right dorsal lateral foot wound also has a fair amount of slough but it is less tender than at our last visit. He still does not have an appointment with dermatology. 03/22/2022: The right anterior tibial wound has closed. The right lateral foot wound is nearly closed with just a little bit of slough. The left dorsal foot wound is smaller and continues to have some slough buildup but less so than at prior visits. There is good granulation tissue underlying the slough. Unfortunately the right medial calf wound continues to deteriorate. It has a foul odor today and a lot of necrotic tissue, the surrounding skin is also erythematous and moist. 03/30/2022: The right lateral foot wound continues to contract and just has a little bit of slough and eschar present. The left dorsal foot wound is  also smaller with slough overlying good granulation tissue. The right medial calf wound actually looks quite a bit better today; there is no odor and there is much less necrotic tissue although some remains present. We have been using topical gentamicin and he has been taking oral Augmentin. 04/07/2022: The patient saw dermatology at [facility] last week. Unfortunately, it appears that they did not read any of the documentation that was provided. 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 Overall, it was a highly unsatisfactory consultation. In the meantime, however, we were able to identify compounding pharmacy who could create a topical sodium thiosulfate ointment. The patient has that with him today. Both of the right foot wounds are now closed. The left dorsal foot wound has contracted but still has a layer of slough on the surface. The medial calf wounds on the right all look a little bit better. They still have heavy slough along with the chunks of calcium. Everything is stained purple. 04/14/2022: The wound on his dorsal left foot is a little bit smaller again today. There is still slough accumulation on the surface. The medial calf wounds have quite a bit less slough accumulation today. His  right leg, however, is warm and erythematous, concerning for cellulitis. 04/14/2022: He completed his course of Augmentin yesterday. The medial calf wound sites are much cleaner today and shallower. There is less fragmented calcium in the wounds. He has a lot of lymphedema fluid-like drainage from these wounds, but no purulent discharge or malodor. The wound on his dorsal left foot has also contracted and is shallower. There is a layer of  slough over healthy granulation tissue. 05/05/2022: The left dorsal foot wound is smaller today with less slough and more granulation tissue. The right medial calf wounds are shallower, but have extensive slough and some fat necrosis. The drainage has diminished considerably, however. 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 05/12/2022: The left dorsal foot wound continues to contract and fill with granulation tissue. There is slough on the wound surface. The right medial calf wounds also continue to fill with healthy tissue, but there is remaining slough and fat necrosis present. He had a significant increase in his drainage this week and it was a bright yellow-green color. He also had a new wound open over his right anterior tibial surface associated with the spike of cutaneous calcium. The leg is more red, as well, concerning for infection. 05/19/2022: His culture returned positive for Pseudomonas. He is currently taking a course of oral levofloxacin. We have also been using topical gentamicin mixed in with his sodium thiosulfate. All of the wounds look better this week. 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 There is slough and nonviable subcutaneous tissue still present. The new anterior tibial wound just has a light layer of slough. The dorsal foot wound also has some slough present and appears a little dry today. 05/26/2022: The right medial leg wound continues to improve. It is feeling with granulation tissue, but still accumulates a fairly thick layer of slough on the surface. The more recent anterior tibial wound has remained quite small, but also has some slough on the surface. The left dorsal foot wound has contracted somewhat and has perimeter epithelialization. He completed his oral levofloxacin. 06/02/2022: The left dorsal foot wound  continues to improve significantly. There is just a little slough on the wound surface. The right medial leg wound had black drainage, but it looks like silver alginate was applied last week and I theorized that silver sulfide was created with a combination of the silver alginate and the GLENARD, NEWMANN ([id number]) [id number] Page 10 of 16 sodium thiosulfate. It did, however, have a bit of an odor to it and extensive slough accumulation. The small anterior tibial wound also had a bit of slough and nonviable subcutaneous tissue present. The skin of his leg was rather macerated, as well. 06/09/2022: The left dorsal foot wound is smaller again this week with just a light layer of slough on the surface. The right medial leg wound looks substantially better. The leg and periwound skin are no longer red and macerated. There is good granulation tissue forming with less slough and nonviable tissue. The anterior tibial wound just has a small amount of slough accumulation. 06/16/2022: The left dorsal foot wound continues to contract. His wrap slipped a little bit, however, and his foot is more swollen. The right medial leg wound continues to improve. The underlying tissue is more vital-appearing and there is less slough. He does have substantial drainage. The small anterior tibial wound has  a bit of slough accumulation. 06/23/2022: The left dorsal foot wound is about half the size as it was last week. There is just some light slough on the surface and some eschar buildup around the edges. The right anterior leg wound is small with a little slough on the surface. Unfortunately, the right medial leg wound deteriorated fairly substantially. It is malodorous with gray/green/black discoloration. 06/30/2022: The left dorsal foot wound continues to contract. It is nearly closed with just a light layer of slough present. The right anterior leg wound is about the same size and has a small amount  of slough on the surface. The right medial leg wound looks a lot better than it did last week. The odor has abated and the tissue is less pulpy and necrotic-looking. There is still a fairly thick layer of slough present. 12/13; the left dorsal foot wound looks very healthy. There was no need to change the dressing here and no debridement. He has a small area on the right anterior lower leg apparently had not changed much in size. The area on the medial leg is the most problematic area this is large with some depth and a completely nonviable surface today. 07/14/2022: The left dorsal foot wound is smaller today with just a little slough and eschar present. The right anterior lower leg wound is about the same without any significant change. The medial leg wound has a black and green surface and is malodorous. No purulent drainage and the periwound skin is intact. Edema control is suboptimal. 07/21/2022: The dorsal foot wound continues to contract and is nearly closed with just a little bit of slough and eschar on the surface. The right anterior lower leg wound is shallower today but otherwise the dimensions are unchanged. The medial leg wound looks significantly better although it still has a nonviable surface; the odor and black/green surface has resolved. His culture came back with multiple species sensitive to Augmentin, which was prescribed and a very low level of Pseudomonas, which should be handled by the topical gentamicin we have been using. 07/28/2022: The dorsal foot wound is down to just a very tiny opening with a little eschar on the surface. The right anterior tibial wound is about the same with some slough buildup. The right medial leg wound looks quite a bit better this week. There is thick rubbery slough on the surface, but the odor and drainage has improved significantly. 08/04/2022: The dorsal foot wound is almost healed, but the periwound skin has broken down. This appears to be  secondary to moisture and adhesive from the absorbent pad. The right anterior tibial wound is shallower. The right medial leg wound continues to improve. It is smaller, still with rubbery slough on the surface. No malodor or purulent drainage. 08/11/2022: The dorsal foot wound is about the same size. It is a little bit dry with some slough accumulation. The right anterior tibial wound is also unchanged. The right medial leg wound is filling in further with good granulation tissue. Still with some slough buildup on the surface. 08/18/2022: His left leg is bright red, hot, and swollen with cellulitis. The dorsal foot wound actually looks quite good and is superficial with just a little bit of slough accumulation. The right anterior tibial wound is unchanged. The right medial leg wound has filled in with good granulation tissue but has copious amounts of drainage. 08/25/2022: His cellulitis has responded nicely to the oral antibiotics. His dorsal foot wound is smaller but has a fair  amount of slough buildup. The right anterior tibial wound remains stable and unchanged. The right medial leg wound has more granulation tissue under a layer of slough, but continues to have copious drainage. The drainage is actually causing skin irritation and threatens to result in breakdown. 09/01/2022: He continues to have profuse drainage from his wounds which is resulting in tissue maceration on both the dorsum of his left foot and the periwound distal to his medial leg wound. The dorsal foot wound is nearly healed, despite this. The medial right leg wound is filling in with good granulation tissue. We are working on getting home health assistance to change his dressings more frequently to try and control the drainage better. 09/08/2022: We have had success in controlling his drainage. The tissue maceration has improved significantly and his swelling has come down quite markedly. The medial right leg wound is much more  superficial and has substantially less nonviable tissue present. The anterior tibial wound is a little bit larger, however with some nonviable tissue present. 09/15/2022: The dorsal foot wound is nearly closed. The anterior tibial wound measures slightly larger today. The medial right leg wound continues to improve quite dramatically with less nonviable tissue and more robust-looking granulation. Edema control is significantly better. 09/22/2022: The left dorsal foot wound remains open, albeit quite tiny. The anterior tibial wound is a little bit larger again today. The medial right leg wound continues to improve with an increase in robust and healthy granulation tissue and less accumulation of nonviable tissue. 09/29/2022: His home health providers wrapped his left leg extremely tightly and he ended up having to cut the wrap off of his foot. This resulted in his foot being very swollen and that the dorsal foot wound is a little bit larger today. The anterior tibial wound is about the same size with a little slough on the surface. The medial right leg wound continues to fill in. There are very few areas with any significant depth. There is still slough accumulation. 3/13; Patient has a left dorsal foot wound along with a right anterior tibial wound and right medial wound. We are using silver alginate to the left dorsal foot wound, Santyl and sodium thiosulfate compound ointment to the right lower extremity wounds under 4 layer compression. Patient has no issues or complaints today. 10/13/2022: The left dorsal foot wound is a little bit bigger today, but his foot is also a little bit more swollen. The right medial leg wound has filled in more and has substantially less depth. There is still some nonviable tissue present. The small anterior lower leg wound looks about the same. 10/20/2022: The left dorsal foot wound is epithelializing. There does not appear to be much open at this site. There is some eschar  around the edges. The right medial leg wound continues to fill in with granulation tissue. The small anterior lower leg wound is a little bit bigger and there is a chunk of calcium protruding. He continues to have fairly copious drainage from the right leg. 10/27/2022: His home health agency failed to [name] up at all this past week and as a result, the excessive drainage from his right leg wound has resulted in tissue breakdown. His leg is red and irritated. The wounds themselves look about the same. The wound on his left dorsal foot is nearly closed underneath some eschar. 11/03/2022: We continue to have difficulty with his home health agency. He continues to have significant drainage from his right leg wound which has resulted  in even further tissue breakdown. He also was not taking his Lasix as prescribed and I think much of the drainage has been his lymphedema fluid choosing the path of least resistance. The wound on his left dorsal foot is closed, but in the process of cleaning the site, dry skin was pulled off and he has a new small superficial wound just adjacent to where the dorsal foot wound was. 11/12/2022: His left dorsal foot is completely healed. The right medial leg continues to deteriorate. He is not taking his Lasix as prescribed, nor is he using his lymphedema pumps at all. As a result, he has massive amounts of drainage saturating his dressings and causing further tissue breakdown on his medial leg. Today, his dressings had a foul odor, as well. HUGHIE, MARTINZ ([id number]) [id number] Page 11 of 16 11/19/2022: His right medial leg looks even worse today. There is more erythema and moisture related tissue breakdown. The 2 anterior wounds look a little bit better. The culture that I took last week did produce Pseudomonas and he is currently taking levofloxacin for this. 11/26/2022: There has been significant improvement to his right medial leg. It is far less raw and  many of the superficial open areas have epithelialized. He continues to cut the foot portion of his wrap and as result his foot is bulbous and swollen compared to the rest of his leg, where edema control is good. The smaller of the 2 anterior tibial wound is closed, while the larger is contracting somewhat with a bit of slough at the base. 12/03/2022: His leg continues to demonstrate improvement. There has been epithelialization of much of the denuded area. He has a new small anterior tibial wound that has opened around a nidus of calcium. Edema control is improved. 12/10/2022: Continued improvement in the periwound skin. The anterior tibial wounds are about the same, but the medial leg wound has improved further. There is less nonviable tissue present. 12/17/2022: Between his visit on Tuesday with the nurse and today, he has had significantly more drainage which has resulted in some periwound tissue maceration. I suspect this correlates somewhat with his diuretic use, as he says he takes his diuretic religiously on Saturday, Sunday, and Monday and then does not take it after Tuesday due to being out and about and not being close to easy restroom access. I suspect the excess fluid is just simply draining out of his leg. 12/21/2022: This was supposed to be a nurse visit, but his leg has broken down so significantly, that we have converted to a wound care visit. He did take his diuretics over the weekend, but despite this, he has had copious drainage and all of his tissues have broken down substantially from the right medial leg ulcer all the way down to his ankle and even wrapping posterior to this. There is blue-green staining on his dressing, suggesting ongoing presence of Pseudomonas aeruginosa. 12/24/2022: The wound already looks better than it did 2 days ago. He has been taking ciprofloxacin and the Urgo clean dressing we applied did a much better job of wicking the drainage away from his leg. There  is slough on the entire surface of the medial leg wound, but the erythema and induration has improved in the past couple of days. 12/31/2022: Continued improvement of the wound. He says that the pain has basically abated since being on ciprofloxacin. Still with fairly heavy drainage and slough accumulation. 01/07/2023: Overall continued improvement. Still with heavy slough buildup, but less drainage. 01/14/2023:  There is a little bit of improvement. The skin is still looking a bit excoriated. He has a few more days left of ciprofloxacin. 01/21/2023: There has been significant improvement. The periwound skin is in much better condition. The ulcers are shallower and actually have some good granulation tissue present under some slough. 01/28/2023: The wound continues to improve. All of the open areas measured smaller today. There is slough accumulation on the surfaces. His drainage has decreased markedly. 02/04/2023: Continued improvement in the wound. Drainage remains significantly diminished. Slough accumulation is present but to a lesser extent. Periwound skin continues to improve. Edema control is good. 02/18/2023: The heavy drainage persists, although it is was not as significant today as it was at his nurse visit on Tuesday. The drainage is yellow and does not have the blue-green discoloration suggestive of Pseudomonas. The actual wounds on his leg are getting smaller, but the periwound skin is quite excoriated. 02/25/2023: His periwound skin is quite excoriated and he has a lot of clear yellow drainage. The main wounds are filling in further and epithelializing. 03/04/2023: The excoriation of the periwound skin has improved. Most of that area has dried up. The main wounds have less slough in them today. 03/11/2023: The main wounds have accumulated a little bit of slough. The periwound excoriation continues to improve. Unfortunately, he suffered a fall over the previous weekend and has abrasions on his knees  bilaterally and on his right wrist and upper arm. In addition, the skin on his left lower leg apparently blistered and opened, leaving several superficial wounds on this extremity. He has been approved for the [facility] and Press wound VAC and has the equipment with him today. 03/18/2023: He has extensive excoriation to the periwound skin on the right medial lower leg. The actual wounds themselves are smaller with slough accumulation. The top of his left foot has begun weeping again. Using the [facility] and Press wound VAC, he says that he through 4 canisters. He seems to be very fluid overloaded, without any significant dyspnea. 03/25/2023: The wound to his left anterior tibial surface has healed. The left dorsal foot wound has gotten smaller and has a small area of hypertrophic granulation tissue present. The periwound skin on the right medial lower leg looks slightly better, but still appears excoriated. The actual wounds continue to contract but are still accumulating quite a bit of slough. The tiny right anterior leg wounds are contracting. 04/01/2023: The left dorsal foot wound got a little bit bigger this week. There is slough accumulation. The periwound skin on the right medial lower leg continues to improve. The main medial right lower leg wounds have a dark gray discoloration and there is a strong odor coming from them. The back of his right lower leg just above the ankle also has a little bit of new breakdown where he is draining edema fluid. The small anterior leg wounds are stable. 04/08/2023: Since starting antibiotics last week, there has been tremendous improvement. The left dorsal foot wound is nearly closed with just a little bit of eschar on the surface. The right medial lower leg is less excoriated and red and he has had substantially less drainage. The amount of slough on the open areas of his wounds has decreased markedly and there is no longer any odor. The back of his right lower leg that was  newly open last week has dried up, but is not completely closed yet. 04/15/2023: The improvement continues with significantly less drainage, resulting in less excoriation  and irritation. The back of his right lower leg is closed, as is the left dorsal foot wound. The open areas on the right medial leg are smaller. 04/22/2023: His drainage has diminished significantly. The tissue maceration and excoriation surrounding the right medial leg wound has also improved substantially. There is slough on all of the open wound surfaces. He does have a bit of an odor coming from his wound today, but there is no purulent drainage nor any significant discoloration of the drainage in his VAC. 04/29/2023: He has a new wound on his left lower anterior tibial surface. This was a blister that came up and then ruptured. There is thin slough on the wound surface but no concern for infection. He has had significantly diminished drainage from his right medial leg ulcers. He was previously going through 4 canisters a day secondary to drainage and this week, he has used only 1 for the entire week. The open areas on his right medial leg are smaller and fewer in number. There is slough on all of the surfaces. No malodor appreciated. 05/06/2023: The wound on his left anterior tibial surface has epithelialized significantly. There is just a small area open in the center. The wound is clean without any slough or other debris accumulation. The drainage on the right leg has almost completely dry. There is slough on the open wound surfaces with significantly more epithelialized between the open sites. He continues on levofloxacin. 05/13/2023: His left lower leg wound is healed. The right lower leg wound continues to improve. The drainage has been minimal and he has not been in the wound VAC for over a week now. There is better skin filling between the open areas and less slough accumulation on the open surfaces. KALIB, STARKOVICH  ([id number]) [id number] Page 12 of 16 05/20/2023: The anterior leg wounds are closing up and there is no slough on the open areas that remain. His medial leg wound was measured longer today, but this appears to include some stuck on silver alginate and dry eschar, rather than the actual open wound areas. There is slough accumulation on the open surfaces. Edema control is good and his drainage has not been excessive. Patient History Information obtained from Patient, Chart. Family History Cancer - Mother, Heart Disease - Father,Mother, Hypertension - Father,Siblings, Lung Disease - Siblings, No family history of Diabetes, Hereditary Spherocytosis, Kidney Disease, Seizures, Stroke, Thyroid Problems, Tuberculosis. Social History Former smoker - quit 1991, Marital Status - Married, Alcohol Use - Moderate, Drug Use - No History, Caffeine Use - Daily - coffee. Medical History Eyes Patient has history of Cataracts - right eye removed Denies history of Glaucoma, Optic Neuritis Ear/Nose/Mouth/Throat Denies history of Chronic sinus problems/congestion, Middle ear problems Hematologic/Lymphatic Patient has history of Anemia - macrocytic, Lymphedema Cardiovascular Patient has history of Congestive Heart Failure, Deep Vein Thrombosis - left leg, Hypertension, Peripheral Venous Disease Endocrine Denies history of Type I Diabetes, Type II Diabetes Genitourinary Denies history of End Stage Renal Disease Integumentary (Skin) Denies history of History of Burn Oncologic Denies history of Received Chemotherapy, Received Radiation Psychiatric Denies history of Anorexia/bulimia, Confinement Anxiety Hospitalization/Surgery History - cervical fusion. - lumbar surgery. - coronary stent placement. - lasik eye surgery. - hydrocele excision. Medical A Surgical History Notes nd Constitutional Symptoms (General Health) obesity Ear/Nose/Mouth/Throat hard of  hearing Respiratory frozen diaphragm right Cardiovascular hyperlipidemia Musculoskeletal myositis, lumbar DDD, spinal stenosis, cervical facet joint syndrome Objective Constitutional no acute distress. Vitals Time Taken: 11:09 AM, Height: 71 in, Weight: 267  lbs, BMI: 37.2, Temperature: 98.5 F, Pulse: 75 bpm, Respiratory Rate: 18 breaths/min, Blood Pressure: 132/65 mmHg. Respiratory Normal work of breathing on room air.. General Notes: 05/20/2023: The anterior leg wounds are closing up and there is no slough on the open areas that remain. His medial leg wound was measured longer today, but this appears to include some stuck on silver alginate and dry eschar, rather than the actual open wound areas. There is slough accumulation on the open surfaces. Edema control is good and his drainage has not been excessive. Integumentary (Hair, Skin) Wound #3 status is Open. Original cause of wound was Gradually Appeared. The date acquired was: 12/07/2021. The wound has been in treatment 66 weeks. The wound is located on the Right,Medial Lower Leg. The wound measures 8cm length x 5.8cm width x 0.3cm depth; 36.442cm^2 area and 10.933cm^3 volume. There is Fat Layer (Subcutaneous Tissue) exposed. There is no tunneling or undermining noted. There is a medium amount of serosanguineous drainage noted. The wound margin is flat and intact. There is small (1-33%) red granulation within the wound bed. There is a large (67-100%) amount of necrotic tissue within the wound bed including Eschar and Adherent Slough. The periwound skin appearance had no abnormalities noted for texture. The periwound skin appearance had no abnormalities noted for moisture. The periwound skin appearance exhibited: Hemosiderin Staining, Rubor. The periwound skin appearance did not exhibit: Erythema. Periwound temperature was noted as No Abnormality. The periwound has tenderness on palpation. Wound #6 status is Open. Original cause of wound  was Gradually Appeared. The date acquired was: 05/12/2022. The wound has been in treatment 53 weeks. The wound is located on the Right,Anterior Lower Leg. The wound measures 0.3cm length x 0.2cm width x 0.2cm depth; 0.047cm^2 area and 0.009cm^3 volume. There is Fat Layer (Subcutaneous Tissue) exposed. There is no tunneling or undermining noted. There is a small amount of serous drainage noted. The wound margin is epibole. There is no granulation within the wound bed. There is no necrotic tissue within the wound bed. The periwound skin appearance had no abnormalities noted for texture. The periwound skin appearance had no abnormalities noted for moisture. The periwound skin appearance exhibited: Hemosiderin DAYVON, LARUSSA ([id number]) [id number] Page 13 of 16 Staining. Periwound temperature was noted as No Abnormality. 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 There was a Selective/Open Wound Non-Viable Tissue Debridement with a total area of 29.14 sq cm performed by Duanne Guess, MD. With the following instrument(s): Curette to remove Non-Viable tissue/material. Material removed includes Eschar and Slough and after achieving pain control using Lidocaine 5% topical ointment. No specimens were taken. A time out was conducted at 11:27, prior to the start of the procedure. A Minimum amount of bleeding was controlled with Pressure. The procedure was tolerated well. Post Debridement Measurements: 8cm length x 5.8cm width x  0.3cm depth; 10.933cm^3 volume. Character of Wound/Ulcer Post Debridement is improved. Post procedure Diagnosis Wound #3: Same as Pre-Procedure Pre-procedure diagnosis of Wound #3 is a Lymphedema located on the Right,Medial Lower Leg . There was a Three Layer Compression Therapy Procedure by [name], RN. Post procedure Diagnosis Wound #3: Same as Pre-Procedure  Plan Follow-up Appointments: Return Appointment in 1 week. - Dr. [name] RM 1 Anesthetic: (In clinic) Topical Lidocaine 4% applied to wound bed Bathing/ Shower/ Hygiene: May shower with protection but do not get wound dressing(s) wet. Protect dressing(s) with water repellant cover (for example, large plastic bag) or a cast cover and may then take shower. Lymphedema Treatment Plan - Exercise, Compression and Elevation: Compression Wraps as ordered Elevate legs 30 - 60 minutes at or above heart level at least 3 - 4 times daily as able/tolerated Avoid standing for long periods and elevate leg(s) parallel to the floor when sitting The following medication(s) was prescribed: lidocaine topical 5 % ointment ointment topical was prescribed at facility WOUND #3: - Lower Leg Wound Laterality: Right, Medial Cleanser: Soap and Water 1 x Per Week/30 Days Discharge Instructions: May shower and wash wound with dial antibacterial soap and water prior to dressing change. Cleanser: Wound Cleanser 1 x Per Week/30 Days Discharge Instructions: Cleanse the wound with wound cleanser prior to applying a clean dressing using gauze sponges, not tissue or cotton balls. Peri-Wound Care: Skin Prep 1 x Per Week/30 Days Discharge Instructions: cavelon to macerated wound margins Peri-Wound Care: Sween Lotion (Moisturizing lotion) 1 x Per Week/30 Days Discharge Instructions: Apply moisturizing lotion as directed Prim Dressing: Maxorb Extra Ag+ Alginate Dressing, 4x4.75 (in/in) 1 x Per Week/30 Days ary Discharge Instructions: Apply to excoriated weeping  areas Secondary Dressing: ABD Pad, 8x10 1 x Per Week/30 Days Discharge Instructions: Apply over primary dressing as directed. Com pression Wrap: ThreePress (3 layer compression wrap) 1 x Per Week/30 Days Discharge Instructions: Apply three layer compression as directed. WOUND #6: - Lower Leg Wound Laterality: Right, Anterior Cleanser: Soap and Water 1 x Per Week/30 Days Discharge Instructions: May shower and wash wound with dial antibacterial soap and water prior to dressing change. Cleanser: Wound Cleanser 1 x Per Week/30 Days Discharge Instructions: Cleanse the wound with wound cleanser prior to applying a clean dressing using gauze sponges, not tissue or cotton balls. Peri-Wound Care: Sween Lotion (Moisturizing lotion) 1 x Per Week/30 Days Discharge Instructions: Apply moisturizing lotion as directed Prim Dressing: Maxorb Extra Ag+ Alginate Dressing, 2x2 (in/in) 1 x Per Week/30 Days ary Discharge Instructions: Apply to wound bed as instructed Secondary Dressing: ABD Pad, 8x10 1 x Per Week/30 Days Discharge Instructions: Apply over primary dressing as directed. Com pression Wrap: ThreePress (3 layer compression wrap) 1 x Per Week/30 Days Discharge Instructions: Apply three layer compression as directed. VISHAL, VARALLO ([id number]) [id number] Page 14 of 16 05/20/2023: The anterior leg wounds are closing up and there is no slough on the open areas that remain. His medial leg wound was measured longer today, but this appears to include some stuck on silver alginate and dry eschar, rather than the actual open wound areas. There is slough accumulation on the open surfaces. Edema control is good and his drainage has not been excessive. I used a curette to debride slough and eschar from the medial leg wounds. The anterior wounds did not require debridement. We will continue silver alginate with 3 layer compression. Follow-up in 1 week. Electronic Signature(s) Signed:  05/20/2023 12:19:57 PM By: Duanne Guess MD FACS Entered By: Duanne Guess on 05/20/2023 09:19:56 -------------------------------------------------------------------------------- HxROS Details Patient Name: Date of Service: [name] 05/20/2023 11:00 A M Medical Record Number: [id number] Patient Account Number: [id number] Date of Birth/Sex: Treating RN: 02/25/1951 (72 y.o. M) Primary Care Provider: [name] Other Clinician: Referring Provider: Treating Provider/Extender: [name]  in Treatment: 64 Information Obtained From Patient Chart Constitutional Symptoms (General Health) Medical History: Past Medical History Notes: obesity Eyes Medical History: Positive for: Cataracts - right eye removed Negative for: Glaucoma; Optic Neuritis Ear/Nose/Mouth/Throat Medical History: Negative for: Chronic sinus problems/congestion; Middle ear problems Past Medical History Notes: hard of hearing Hematologic/Lymphatic Medical History: Positive for: Anemia - macrocytic; Lymphedema Respiratory Medical History: Past Medical History Notes: frozen diaphragm right Cardiovascular Medical History: Positive for: Congestive Heart Failure; Deep Vein Thrombosis - left leg; Hypertension; Peripheral Venous Disease Past Medical History Notes: hyperlipidemia Endocrine Medical History: Negative for: Type I Diabetes; Type II Diabetes KOHL, LAUDERMAN ([id number]) [id number] Page 15 of 16 Genitourinary Medical History: Negative for: End Stage Renal Disease Integumentary (Skin) Medical History: Negative for: History of Burn Musculoskeletal Medical History: Past Medical History Notes: myositis, lumbar DDD, spinal stenosis, cervical facet joint syndrome Oncologic Medical History: Negative for: Received Chemotherapy; Received Radiation Psychiatric Medical History: Negative for: Anorexia/bulimia; Confinement Anxiety HBO Extended  History Items Eyes: Cataracts Immunizations Pneumococcal Vaccine: Received Pneumococcal Vaccination: Yes Received Pneumococcal Vaccination On or After 60th Birthday: Yes Implantable Devices No devices added Hospitalization / Surgery History Type of Hospitalization/Surgery cervical fusion lumbar surgery coronary stent placement lasik eye surgery hydrocele excision Family and Social History Cancer: Yes - Mother; Diabetes: No; Heart Disease: Yes - Father,Mother; Hereditary Spherocytosis: No; Hypertension: Yes - Father,Siblings; Kidney Disease: No; Lung Disease: Yes - Siblings; Seizures: No; Stroke: No; Thyroid Problems: No; Tuberculosis: No; Former smoker - quit 1991; Marital Status - Married; Alcohol Use: Moderate; Drug Use: No History; Caffeine Use: Daily - coffee; Financial Concerns: No; Food, Clothing or Shelter Needs: No; Support System Lacking: No; Transportation Concerns: No Electronic Signature(s) Signed: 05/20/2023 12:25:45 PM By: Duanne Guess MD FACS Entered By: Duanne Guess on 05/20/2023 09:16:05 -------------------------------------------------------------------------------- SuperBill Details Patient Name: Date of Service: KINGMESSIAH, MAHARAJ 05/20/2023 Medical Record Number: [id number] Patient Account Number: [id number] Date of Birth/Sex: Treating RN: 1951/04/08 (72 y.o. 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.0 Lymphedema, not elsewhere classified I87.2 Venous insufficiency (chronic) (peripheral) Z86.718 Personal history of other venous thrombosis and embolism I10 Essential (primary)  hypertension I25.10 Atherosclerotic heart disease of native coronary artery without angina pectoris Facility Procedures : CPT4 Code: [id number] Description: 97597 - DEBRIDE WOUND 1ST 20 SQ CM OR < ICD-10 Diagnosis Description 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 Modifier: d Quantity: 1 : CPT4 Code: [id number] Description: 97598 - DEBRIDE WOUND EA ADDL 20 SQ CM ICD-10 Diagnosis Description 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 Modifier: d Quantity: 1 Physician Procedures : CPT4 Code Description Modifier [id number] 99213 - WC PHYS LEVEL 3 - EST PT ICD-10 Diagnosis Description 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.0 Lymphedema, not elsewhere  classified I87.2 Venous insufficiency (chronic) (peripheral) Quantity: 1 : [id number] 97597 - WC PHYS DEBR WO ANESTH 20 SQ CM ICD-10 Diagnosis Description 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ed Quantity: 1 : [id number] 97598 - WC PHYS DEBR WO ANESTH EA ADD 20 CM ICD-10 Diagnosis Description L97.812 Non-pressure chronic ulcer of other  part of right lower leg with fat layer exposed Quantity: 1 Electronic Signature(s) Signed: 05/20/2023 12:20:35 PM By: Duanne Guess MD FACS Entered By: Duanne Guess on 05/20/2023 09:20:35

## 2023-05-20 NOTE — Progress Notes (Signed)
[name], [name] ([id number]) [id number] Page 1 of 10 Visit Report for 05/20/2023 Arrival Information Details Patient Name: Date of Service: [name], [name] 05/20/2023 11:00 A M Medical Record Number: [id number] Patient Account Number: [id number] Date of Birth/Sex: Treating RN: [birth year] (72 y.o. [name] Primary Care Raley Novicki: [name] Other Clinician: Referring Jacoria Keiffer: Treating Joesph Marcy/Extender: [name] in Treatment: 43 Visit Information History Since Last Visit Added or deleted any medications: No Patient Arrived: Wheel Chair Any new allergies or adverse reactions: No Arrival Time: 11:07 Had a fall or experienced change in No Accompanied By: self activities of daily living that may affect Transfer Assistance: None risk of falls: Patient Identification Verified: Yes Signs or symptoms of abuse/neglect since last visito No Patient Requires Transmission-Based Precautions: No Hospitalized since last visit: No Patient Has Alerts: Yes Implantable device outside of the clinic excluding No Patient Alerts: R ABI= N/C, TBI= .75 cellular tissue based products placed in the center L ABI =N/C, TBI = .57 since last visit: Has Dressing in Place as Prescribed: Yes Has Compression in Place as Prescribed: Yes Pain Present Now: No Electronic Signature(s) Signed: 05/20/2023 12:52:22 PM By: Karie Schwalbe RN Entered By: Karie Schwalbe on 05/20/2023 08:08:42 -------------------------------------------------------------------------------- Compression Therapy Details Patient Name: Date of Service: [name] 05/20/2023 11:00 A M Medical Record Number: [id number] Patient Account Number: [id number] Date of Birth/Sex: Treating RN: 06/23/1951 (72 y.o. [name] Primary Care Lauralei Clouse: [name] Other Clinician: Referring Alexianna Nachreiner: Treating Kimberlin Scheel/Extender: [name] in  Treatment: 66 Compression Therapy Performed for Wound Assessment: Wound #3 Right,Medial Lower Leg Performed By: Clinician [name], RN Compression Type: Three Layer Post Procedure Diagnosis Same as Pre-procedure Electronic Signature(s) Signed: 05/20/2023 11:55:59 AM By: [name] Entered By: [name] on 05/20/2023 08:32:08 [name] ([id number]) [id number] Page 2 of 10 -------------------------------------------------------------------------------- Encounter Discharge Information Details Patient Name: Date of Service: [name], [name] 05/20/2023 11:00 A M Medical Record Number: [id number] Patient Account Number: [id number] Date of Birth/Sex: Treating RN: 10/18/1950 (72 y.o. [name] Primary Care Zohair Epp: [name] Other Clinician: Referring Aiden Rao: Treating Genesee Nase/Extender: [name] in Treatment: 95 Encounter Discharge Information Items Post Procedure Vitals Discharge Condition: Stable Temperature (F): 98.5 Ambulatory Status: Wheelchair Pulse (bpm): 75 Discharge Destination: Home Respiratory Rate (breaths/min): 18 Transportation: Private Auto Blood Pressure (mmHg): 132/65 Accompanied By: self Schedule Follow-up Appointment: Yes Clinical Summary of Care: Patient Declined Electronic Signature(s) Signed: 05/20/2023 11:55:59 AM By: [name] Entered By: [name] on 05/20/2023 08:44:30 -------------------------------------------------------------------------------- Lower Extremity Assessment Details Patient Name: Date of Service: [name], [name] 05/20/2023 11:00 A M Medical Record Number: [id number] Patient Account Number: [id number] Date of Birth/Sex: Treating RN: 05/24/1951 (71 y.o. [name] Primary Care Meredith Kilbride: [name] Other Clinician: Referring Naiya Corral: Treating Fareeha Evon/Extender: [name] in  Treatment: 66 Edema Assessment Assessed: Kyra Searles: No] Franne Forts: No] Edema: [Left: Yes] [Right: Yes] Calf Left: Right: Point of Measurement: From Medial Instep 40.5 cm 46.5 cm Ankle Left: Right: Point of Measurement: From Medial Instep 25.5 cm 26.2 cm Vascular Assessment Pulses: Dorsalis Pedis Palpable: [Right:Yes] Extremity colors, hair growth, and conditions: Extremity Color: [Left:Hyperpigmented] [Right:Hyperpigmented] Hair Growth on Extremity: [Left:Yes] [Right:Yes] Temperature of Extremity: [Left:Warm] [Right:Warm] Capillary Refill: [Left:< 3 seconds] [Right:< 3 seconds] Dependent Rubor: [Left:No No] [Right:No No] Electronic Signature(s) Signed: 05/20/2023 12:52:22 PM By: Karie Schwalbe RN Entered By: Karie Schwalbe on 05/20/2023 08:12:32 [name] ([id number]) [id number] Page 3 of 10 -------------------------------------------------------------------------------- Multi Wound Chart Details Patient Name:  Date of Service: [name], [name] 05/20/2023 11:00 A M Medical Record Number: [id number] Patient Account Number: [id number] Date of Birth/Sex: Treating RN: May 10, 1951 (72 y.o. M) Primary Care Deyani Hegarty: [name] Other Clinician: Referring Citlaly Camplin: Treating Zymir Napoli/Extender: [name] in Treatment: 70 Vital Signs Height(in): 71 Pulse(bpm): 75 Weight(lbs): 267 Blood Pressure(mmHg): 132/65 Body Mass Index(BMI): 37.2 Temperature(F): 98.5 Respiratory Rate(breaths/min): 18 [3:Photos:] [N/A:N/A] Right, Medial Lower Leg Right, Anterior Lower Leg N/A Wound Location: Gradually Appeared Gradually Appeared N/A Wounding Event: Lymphedema Calcinosis N/A Primary Etiology: Cataracts, Anemia, Lymphedema, Cataracts, Anemia, Lymphedema, N/A Comorbid History: Congestive Heart Failure, Deep Vein Congestive Heart Failure, Deep Vein Thrombosis, Hypertension, Peripheral Thrombosis, Hypertension, Peripheral Venous Disease  Venous Disease 12/07/2021 05/12/2022 N/A Date Acquired: 39 53 N/A Weeks of Treatment: Open Open N/A Wound Status: No No N/A Wound Recurrence: No Yes N/A Clustered Wound: N/A 2 N/A Clustered Quantity: 8x5.8x0.3 0.3x0.2x0.2 N/A Measurements L x W x D (cm) 36.442 0.047 N/A A (cm) : rea 10.933 0.009 N/A Volume (cm) : -582.30% 80.10% N/A % Reduction in A rea: -411.80% 62.50% N/A % Reduction in Volume: Full Thickness With Exposed Support Full Thickness Without Exposed N/A Classification: Structures Support Structures Medium Small N/A Exudate A mount: Serosanguineous Serous N/A Exudate Type: red, brown amber N/A Exudate Color: Flat and Intact Epibole N/A Wound Margin: Small (1-33%) None Present (0%) N/A Granulation A mount: Red N/A N/A Granulation Quality: Large (67-100%) None Present (0%) N/A Necrotic A mount: Eschar, Adherent Slough N/A N/A Necrotic Tissue: Fat Layer (Subcutaneous Tissue): Yes Fat Layer (Subcutaneous Tissue): Yes N/A Exposed Structures: Fascia: No Fascia: No Tendon: No Tendon: No Muscle: No Muscle: No Joint: No Joint: No Bone: No Bone: No Medium (34-66%) Large (67-100%) N/A Epithelialization: Debridement - Selective/Open Wound N/A N/A Debridement: Pre-procedure Verification/Time Out 11:27 N/A N/A Taken: Lidocaine 5% topical ointment N/A N/A Pain Control: Necrotic/Eschar, Slough N/A N/A Tissue Debrided: Non-Viable Tissue N/A N/A Level: 29.14 N/A N/A Debridement A (sq cm): rea Curette N/A N/A Instrument: Minimum N/A N/A Bleeding: Pressure N/A N/A Hemostasis A chieved: Procedure was tolerated well N/A N/A Debridement Treatment Response: 8x5.8x0.3 N/A N/A Post Debridement Measurements L x [name], [name] ([id number]) [id number] Page 4 of 10 W x D (cm) 10.933 N/A N/A Post Debridement Volume: (cm) Excoriation: No No Abnormalities Noted N/A Periwound Skin Texture: Maceration: Yes No Abnormalities Noted  N/A Periwound Skin Moisture: Hemosiderin Staining: Yes Hemosiderin Staining: Yes N/A Periwound Skin Color: Rubor: Yes Erythema: No No Abnormality No Abnormality N/A Temperature: Yes N/A N/A Tenderness on Palpation: Compression Therapy N/A N/A Procedures Performed: Debridement Treatment Notes Wound #3 (Lower Leg) Wound Laterality: Right, Medial Cleanser Soap and Water Discharge Instruction: May shower and wash wound with dial antibacterial soap and water prior to dressing change. Wound Cleanser Discharge Instruction: Cleanse the wound with wound cleanser prior to applying a clean dressing using gauze sponges, not tissue or cotton balls. Peri-Wound Care Skin Prep Discharge Instruction: cavelon to macerated wound margins Sween Lotion (Moisturizing lotion) Discharge Instruction: Apply moisturizing lotion as directed Topical Primary Dressing Maxorb Extra Ag+ Alginate Dressing, 4x4.75 (in/in) Discharge Instruction: Apply to excoriated weeping areas Secondary Dressing ABD Pad, 8x10 Discharge Instruction: Apply over primary dressing as directed. Secured With Compression Wrap ThreePress (3 layer compression wrap) Discharge Instruction: Apply three layer compression as directed. Compression Stockings Add-Ons Wound #6 (Lower Leg) Wound Laterality: Right, Anterior Cleanser Soap and Water Discharge Instruction: May shower and wash wound with dial antibacterial soap and water prior to dressing change. Wound Cleanser Discharge Instruction:  Cleanse the wound with wound cleanser prior to applying a clean dressing using gauze sponges, not tissue or cotton balls. Peri-Wound Care Sween Lotion (Moisturizing lotion) Discharge Instruction: Apply moisturizing lotion as directed Topical Primary Dressing Maxorb Extra Ag+ Alginate Dressing, 2x2 (in/in) Discharge Instruction: Apply to wound bed as instructed Secondary Dressing ABD Pad, 8x10 Discharge Instruction: Apply over primary dressing  as directed. Secured With Compression Wrap ThreePress (3 layer compression wrap) Discharge Instruction: Apply three layer compression as directed. [name], [name] ([id number]) [id number] Page 5 of 10 Compression Stockings Add-Ons Electronic Signature(s) Signed: 05/20/2023 12:14:17 PM By: Duanne Guess MD FACS Entered By: Duanne Guess on 05/20/2023 09:14:17 -------------------------------------------------------------------------------- Multi-Disciplinary Care Plan Details Patient Name: Date of Service: [name], [name] 05/20/2023 11:00 A M Medical Record Number: [id number] Patient Account Number: [id number] Date of Birth/Sex: Treating RN: 01-28-1951 (72 y.o. [name] Primary Care Levern Pitter: [name] Other Clinician: Referring Guila Owensby: Treating Amirrah Quigley/Extender: [name] in Treatment: 61 Multidisciplinary Care Plan reviewed with physician Active Inactive Venous Leg Ulcer Nursing Diagnoses: Knowledge deficit related to disease process and management Potential for venous Insuffiency (use before diagnosis confirmed) Goals: Patient will maintain optimal edema control Date Initiated: 02/17/2022 Target Resolution Date: 06/24/2023 Goal Status: Active Interventions: Assess peripheral edema status every visit. Compression as ordered Provide education on venous insufficiency Treatment Activities: Non-invasive vascular studies : 02/17/2022 Therapeutic compression applied : 02/17/2022 Notes: Wound/Skin Impairment Nursing Diagnoses: Impaired tissue integrity Knowledge deficit related to ulceration/compromised skin integrity Goals: Patient/caregiver will verbalize understanding of skin care regimen Date Initiated: 02/17/2022 Target Resolution Date: 06/24/2023 Goal Status: Active Ulcer/skin breakdown will have a volume reduction of 30% by week 4 Date Initiated: 02/17/2022 Date Inactivated: 03/10/2022 Target  Resolution Date: 03/10/2022 Unmet Reason: infection being treated, Goal Status: Unmet waiting on derm appte Ulcer/skin breakdown will have a volume reduction of 50% by week 8 Date Initiated: 03/10/2022 Date Inactivated: 04/14/2022 Target Resolution Date: 04/07/2022 Goal Status: Unmet Unmet Reason: calcinosis Ulcer/skin breakdown will have a volume reduction of 80% by week 12 Date Initiated: 04/14/2022 Date Inactivated: 05/05/2022 Target Resolution Date: 05/05/2022 Unmet Reason: infection, chronic Goal Status: Unmet condition InterventionsAZIM, BAK ([id number]) [id number] Page 6 of 10 Assess patient/caregiver ability to obtain necessary supplies Assess patient/caregiver ability to perform ulcer/skin care regimen upon admission and as needed Assess ulceration(s) every visit Provide education on ulcer and skin care Treatment Activities: Skin care regimen initiated : 02/17/2022 Topical wound management initiated : 02/17/2022 Notes: Electronic Signature(s) Signed: 05/20/2023 11:55:59 AM By: [name] Entered By: [name] on 05/20/2023 08:43:40 -------------------------------------------------------------------------------- Pain Assessment Details Patient Name: Date of Service: SERGEI, BORTNER 05/20/2023 11:00 A M Medical Record Number: [id number] Patient Account Number: [id number] Date of Birth/Sex: Treating RN: [birth year] (72 y.o. [name] Primary Care Scheryl Sanborn: [name] Other Clinician: Referring Mccrae Speciale: Treating Elhadj Girton/Extender: [name] in Treatment: 83 Active Problems Location of Pain Severity and Description of Pain Patient Has Paino No Site Locations Pain Management and Medication Current Pain Management: Electronic Signature(s) Signed: 05/20/2023 12:52:22 PM By: Karie Schwalbe RN Entered By: Karie Schwalbe on 05/20/2023  08:10:42 -------------------------------------------------------------------------------- Patient/Caregiver Education Details Patient Name: Date of Service: [name], [name] 10/25/2024andnbsp11:00 A M [name], [name] Maduro ([id number]) [id number] Page 7 of 10 Medical Record Number: [id number] Patient Account Number: [id number] Date of Birth/Gender: Treating RN: 02-08-1951 (72 y.o. [name] Primary Care Physician: [name] Other Clinician: Referring Physician: Treating Physician/Extender: [name] in Treatment: 17 Education Assessment Education Provided To: Patient Education  Topics Provided Wound/Skin Impairment: Methods: Explain/Verbal Responses: Reinforcements needed, State content correctly Electronic Signature(s) Signed: 05/20/2023 11:55:59 AM By: [name] Entered By: [name] on 05/20/2023 08:43:53 -------------------------------------------------------------------------------- Wound Assessment Details Patient Name: Date of Service: [name], [name] 05/20/2023 11:00 A M Medical Record Number: [id number] Patient Account Number: [id number] Date of Birth/Sex: Treating RN: 08/11/1950 (72 y.o. M) Primary Care Braxton Vantrease: [name] Other Clinician: Referring Kerensa Nicklas: Treating Jasper Hanf/Extender: [name] in Treatment: 66 Wound Status Wound Number: 3 Primary Lymphedema Etiology: Wound Location: Right, Medial Lower Leg Wound Open Wounding Event: Gradually Appeared Status: Date Acquired: 12/07/2021 Comorbid Cataracts, Anemia, Lymphedema, Congestive Heart Failure, Deep Weeks Of Treatment: 66 History: Vein Thrombosis, Hypertension, Peripheral Venous Disease Clustered Wound: No Photos Wound Measurements Length: (cm) 8 Width: (cm) 5.8 Depth: (cm) 0.3 Area: (cm) 36.442 Volume: (cm) 10.933 % Reduction in Area: -582.3% % Reduction in Volume:  -411.8% Epithelialization: Medium (34-66%) Tunneling: No Undermining: No Wound Description Classification: Full Thickness With Exposed Support Structures Wound Margin: Flat and Intact Exudate Amount: Medium Exudate Type: Serosanguineous Sabater, Davidlee ([id number]) Exudate Color: red, brown Foul Odor After Cleansing: No Slough/Fibrino Yes [id number] Page 8 of 10 Wound Bed Granulation Amount: Small (1-33%) Exposed Structure Granulation Quality: Red Fascia Exposed: No Necrotic Amount: Large (67-100%) Fat Layer (Subcutaneous Tissue) Exposed: Yes Necrotic Quality: Eschar, Adherent Slough Tendon Exposed: No Muscle Exposed: No Joint Exposed: No Bone Exposed: No Periwound Skin Texture Texture Color No Abnormalities Noted: Yes No Abnormalities Noted: No Erythema: No Moisture Hemosiderin Staining: Yes No Abnormalities Noted: Yes Rubor: Yes Temperature / Pain Temperature: No Abnormality Tenderness on Palpation: Yes Treatment Notes Wound #3 (Lower Leg) Wound Laterality: Right, Medial Cleanser Soap and Water Discharge Instruction: May shower and wash wound with dial antibacterial soap and water prior to dressing change. Wound Cleanser Discharge Instruction: Cleanse the wound with wound cleanser prior to applying a clean dressing using gauze sponges, not tissue or cotton balls. Peri-Wound Care Skin Prep Discharge Instruction: cavelon to macerated wound margins Sween Lotion (Moisturizing lotion) Discharge Instruction: Apply moisturizing lotion as directed Topical Primary Dressing Maxorb Extra Ag+ Alginate Dressing, 4x4.75 (in/in) Discharge Instruction: Apply to excoriated weeping areas Secondary Dressing ABD Pad, 8x10 Discharge Instruction: Apply over primary dressing as directed. Secured With Compression Wrap ThreePress (3 layer compression wrap) Discharge Instruction: Apply three layer compression as directed. Compression  Stockings Add-Ons Electronic Signature(s) Signed: 05/20/2023 12:52:22 PM By: Karie Schwalbe RN Entered By: Karie Schwalbe on 05/20/2023 08:15:27 -------------------------------------------------------------------------------- Wound Assessment Details Patient Name: Date of Service: [name], [name] 05/20/2023 11:00 A M Medical Record Number: [id number] Patient Account Number: [id number] Date of Birth/Sex: Treating RN: Aug 02, 1950 (72 y.o. [name], [name] Maduro ([id number]) [id number] Page 9 of 10 Primary Care Meckenzie Balsley: [name] Other Clinician: Referring Caileb Rhue: Treating Xoe Hoe/Extender: [name] in Treatment: 66 Wound Status Wound Number: 6 Primary Calcinosis Etiology: Wound Location: Right, Anterior Lower Leg Wound Open Wounding Event: Gradually Appeared Status: Date Acquired: 05/12/2022 Comorbid Cataracts, Anemia, Lymphedema, Congestive Heart Failure, Deep Weeks Of Treatment: 53 History: Vein Thrombosis, Hypertension, Peripheral Venous Disease Clustered Wound: Yes Photos Wound Measurements Length: (cm) Width: (cm) Depth: (cm) Clustered Quantity: Area: (cm) Volume: (cm) 0.3 % Reduction in Area: 80.1% 0.2 % Reduction in Volume: 62.5% 0.2 Epithelialization: Large (67-100%) 2 Tunneling: No 0.047 Undermining: No 0.009 Wound Description Classification: Full Thickness Without Exposed Sup Wound Margin: Epibole Exudate Amount: Small Exudate Type: Serous Exudate Color: amber port Structures Foul Odor After Cleansing: No Slough/Fibrino No Wound Bed Granulation Amount: None  Present (0%) Exposed Structure Necrotic Amount: None Present (0%) Fascia Exposed: No Fat Layer (Subcutaneous Tissue) Exposed: Yes Tendon Exposed: No Muscle Exposed: No Joint Exposed: No Bone Exposed: No Periwound Skin Texture Texture Color No Abnormalities Noted: Yes No Abnormalities Noted: No Hemosiderin Staining: Yes Moisture No  Abnormalities Noted: Yes Temperature / Pain Temperature: No Abnormality Treatment Notes Wound #6 (Lower Leg) Wound Laterality: Right, Anterior Cleanser Soap and Water Discharge Instruction: May shower and wash wound with dial antibacterial soap and water prior to dressing change. Wound Cleanser Discharge Instruction: Cleanse the wound with wound cleanser prior to applying a clean dressing using gauze sponges, not tissue or cotton balls. Peri-Wound Care Sween Lotion (Moisturizing lotion) Discharge Instruction: Apply moisturizing lotion as directed Topical MARIANNE, OW ([id number]) [id number] Page 10 of 10 Primary Dressing Maxorb Extra Ag+ Alginate Dressing, 2x2 (in/in) Discharge Instruction: Apply to wound bed as instructed Secondary Dressing ABD Pad, 8x10 Discharge Instruction: Apply over primary dressing as directed. Secured With Compression Wrap ThreePress (3 layer compression wrap) Discharge Instruction: Apply three layer compression as directed. Compression Stockings Add-Ons Electronic Signature(s) Signed: 05/20/2023 12:52:22 PM By: Karie Schwalbe RN Entered By: Karie Schwalbe on 05/20/2023 08:16:27 -------------------------------------------------------------------------------- Vitals Details Patient Name: Date of Service: [name] 05/20/2023 11:00 A M Medical Record Number: [id number] Patient Account Number: [id number] Date of Birth/Sex: Treating RN: 1950-07-29 (72 y.o. [name] Primary Care Jair Lindblad: [name] Other Clinician: Referring Antolin Belsito: Treating Sachi Boulay/Extender: [name] in Treatment: 66 Vital Signs Time Taken: 11:09 Temperature (F): 98.5 Height (in): 71 Pulse (bpm): 75 Weight (lbs): 267 Respiratory Rate (breaths/min): 18 Body Mass Index (BMI): 37.2 Blood Pressure (mmHg): 132/65 Reference Range: 80 - 120 mg / dl Electronic Signature(s) Signed: 05/20/2023 12:52:22 PM By:  Karie Schwalbe RN Entered By: Karie Schwalbe on 05/20/2023 08:10:29

## 2023-05-24 ENCOUNTER — Ambulatory Visit (HOSPITAL_BASED_OUTPATIENT_CLINIC_OR_DEPARTMENT_OTHER): Payer: Medicare Other | Admitting: General Surgery

## 2023-05-27 ENCOUNTER — Encounter (HOSPITAL_BASED_OUTPATIENT_CLINIC_OR_DEPARTMENT_OTHER): Payer: Medicare Other | Attending: General Surgery | Admitting: General Surgery

## 2023-05-27 DIAGNOSIS — I2510 Atherosclerotic heart disease of native coronary artery without angina pectoris: Secondary | ICD-10-CM | POA: Insufficient documentation

## 2023-05-27 DIAGNOSIS — L97512 Non-pressure chronic ulcer of other part of right foot with fat layer exposed: Secondary | ICD-10-CM | POA: Insufficient documentation

## 2023-05-27 DIAGNOSIS — L97822 Non-pressure chronic ulcer of other part of left lower leg with fat layer exposed: Secondary | ICD-10-CM | POA: Insufficient documentation

## 2023-05-27 DIAGNOSIS — I890 Lymphedema, not elsewhere classified: Secondary | ICD-10-CM | POA: Diagnosis not present

## 2023-05-27 DIAGNOSIS — L97812 Non-pressure chronic ulcer of other part of right lower leg with fat layer exposed: Secondary | ICD-10-CM | POA: Insufficient documentation

## 2023-05-27 DIAGNOSIS — I872 Venous insufficiency (chronic) (peripheral): Secondary | ICD-10-CM | POA: Insufficient documentation

## 2023-05-27 DIAGNOSIS — I110 Hypertensive heart disease with heart failure: Secondary | ICD-10-CM | POA: Insufficient documentation

## 2023-05-27 DIAGNOSIS — Z86718 Personal history of other venous thrombosis and embolism: Secondary | ICD-10-CM | POA: Diagnosis not present

## 2023-05-27 DIAGNOSIS — L942 Calcinosis cutis: Secondary | ICD-10-CM | POA: Insufficient documentation

## 2023-05-27 DIAGNOSIS — Z79899 Other long term (current) drug therapy: Secondary | ICD-10-CM | POA: Insufficient documentation

## 2023-05-27 DIAGNOSIS — I509 Heart failure, unspecified: Secondary | ICD-10-CM | POA: Insufficient documentation

## 2023-05-27 MED ORDER — LISINOPRIL 10 MG PO TABS
10.0000 mg | ORAL_TABLET | Freq: Every day | ORAL | 3 refills | Status: DC
  Filled 2024-02-17: qty 90, 90d supply, fill #0

## 2023-05-27 NOTE — Progress Notes (Signed)
[name], [name] ([id number]) [id number] Page 1 of 17 Visit Report for 05/27/2023 Chief Complaint Document Details Patient Name: Date of Service: [name], [name] 05/27/2023 11:00 A M Medical Record Number: [id number] Patient Account Number: [id number] Date of Birth/Sex: Treating RN: [birth year] (72 y.o. M) Primary Care Provider: [name] Other Clinician: Referring Provider: Treating Provider/Extender: [name] in Treatment: 54 Information Obtained from: Patient Chief Complaint Patient seen for complaints of Non-Healing Wounds. Electronic Signature(s) Signed: 05/27/2023 12:03:19 PM By: Duanne Guess MD FACS Entered By: Duanne Guess on 05/27/2023 09:03:19 -------------------------------------------------------------------------------- Debridement Details Patient Name: Date of Service: [name] 05/27/2023 11:00 A M Medical Record Number: [id number] Patient Account Number: [id number] Date of Birth/Sex: Treating RN: 08-22-1950 (72 y.o. [name] Primary Care Provider: [name] Other Clinician: Referring Provider: Treating Provider/Extender: [name] in Treatment: 67 Debridement Performed for Assessment: Wound #3 Right,Medial Lower Leg Performed By: Physician Duanne Guess, MD The following information was scribed by: Zenaida Deed The information was scribed for: Duanne Guess Debridement Type: Debridement Level of Consciousness (Pre-procedure): Awake and Alert Pre-procedure Verification/Time Out Yes - 11:35 Taken: Start Time: 11:35 Pain Control: Lidocaine 4% Topical Solution Percent of Wound Bed Debrided: 20% T Area Debrided (cm): otal 6.48 Tissue and other material debrided: Non-Viable, Eschar, Slough, Subcutaneous, Slough Level: Skin/Subcutaneous Tissue Debridement Description: Excisional Instrument: Curette Bleeding: Minimum Hemostasis Achieved:  Pressure Procedural Pain: 3 Post Procedural Pain: 1 Response to Treatment: Procedure was tolerated well Level of Consciousness (Post- Awake and Alert procedure): Post Debridement Measurements of Total Wound Length: (cm) 7.5 Width: (cm) 5.5 Depth: (cm) 0.3 Volume: (cm) 9.719 Character of Wound/Ulcer Post Debridement: Improved SULAIMAN, IMBERT ([id number]) [id number] Page 2 of 17 Post Procedure Diagnosis Same as Pre-procedure Electronic Signature(s) Signed: 05/27/2023 12:11:02 PM By: Duanne Guess MD FACS Signed: 05/27/2023 1:05:52 PM By: Zenaida Deed RN, BSN Entered By: Zenaida Deed on 05/27/2023 08:41:10 -------------------------------------------------------------------------------- Debridement Details Patient Name: Date of Service: [name], [name] 05/27/2023 11:00 A M Medical Record Number: [id number] Patient Account Number: [id number] Date of Birth/Sex: Treating RN: Sep 07, 1950 (72 y.o. [name], [name] Primary Care Provider: [name] Other Clinician: Referring Provider: Treating Provider/Extender: [name] in Treatment: 67 Debridement Performed for Assessment: Wound #11 Right,Dorsal Foot Performed By: Physician Duanne Guess, MD The following information was scribed by: Zenaida Deed The information was scribed for: Duanne Guess Debridement Type: Debridement Level of Consciousness (Pre-procedure): Awake and Alert Pre-procedure Verification/Time Out Yes - 11:35 Taken: Start Time: 11:35 Pain Control: Lidocaine 4% T opical Solution Percent of Wound Bed Debrided: 100% T Area Debrided (cm): otal 0.39 Tissue and other material debrided: Viable, Non-Viable, Subcutaneous, Biofilm Level: Skin/Subcutaneous Tissue Debridement Description: Excisional Instrument: Curette Bleeding: Minimum Hemostasis Achieved: Pressure Procedural Pain: 3 Post Procedural Pain: 1 Response to Treatment: Procedure was  tolerated well Level of Consciousness (Post- Awake and Alert procedure): Post Debridement Measurements of Total Wound Length: (cm) 0.5 Width: (cm) 1 Depth: (cm) 0.1 Volume: (cm) 0.039 Character of Wound/Ulcer Post Debridement: Improved Post Procedure Diagnosis Same as Pre-procedure Electronic Signature(s) Signed: 05/27/2023 12:11:02 PM By: Duanne Guess MD FACS Signed: 05/27/2023 1:05:52 PM By: Zenaida Deed RN, BSN Entered By: Zenaida Deed on 05/27/2023 08:41:55 HPI Details -------------------------------------------------------------------------------- [name] ([id number]) [id number] Page 3 of 17 Patient Name: Date of Service: [name], [name] 05/27/2023 11:00 A M Medical Record Number: [id number] Patient Account Number: [id number] Date of Birth/Sex: Treating RN: [birth year] (72 y.o. M) Primary Care Provider: [name] Other Clinician: Referring Provider: Treating  Provider/Extender: [name] in Treatment: 30 History of Present Illness HPI Description: ADMISSION 02/09/2022 This is a 72 year old man who recently moved to [place] from [place]. He has a history of of coronary artery disease and prior left popliteal vein DVT . He is not diabetic and does not smoke. 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 He has had multiple spinal surgeries and has limited mobility. He primarily uses a motorized wheelchair. He has had lymphedema for many years and does have lymphedema pumps although he says he does not use them frequently. He has wounds on his bilateral lower extremities. He was receiving care in [place] for these prior to his move. They were apparently packing his wounds with Betadine soaked gauze. He has worn compression wraps in the past but not recently. He did say that they helped tremendously. In clinic today, his right ABI is noncompressible but has good signals; the left ABI was 1.17. No  formal vascular studies have been performed. On his left dorsal foot, there is a circular lesion that exposes the fat layer. There is fairly heavy slough accumulation within the wound, but no significant odor. On his right medial calf, he has multiple pinholes that have slough buildup in them and probe for about 2 to 4 mm in depth. They are draining clear fluid. On his right lateral midfoot, he has ulceration consistent with venous stasis. It is geographic and has slough accumulation. 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 02/17/2022: All of the wounds are little bit larger today and have more slough accumulation. Today, the medial calf openings are larger and probe to something that feels calcified. There is odor coming from his wounds. His vascular studies are scheduled for August 9 and 10. 02-24-2022 upon evaluation today patient presents for follow-up concerning his ongoing issues here with his lower extremities. With that being said he does have significant problems with what appears to be cellulitis unfortunately the PCR culture that was sent last week got crushed by [employer] in transit. With that being said we are going to reobtain a culture today although I am actually to do it on the right medial leg as this seems to be the most inflamed area today where there is some questionable drainage as well. I am going to remove some of the calcium deposits which are loosening obtain a deeper culture from this area. Fortunately I do not see any evidence of active infection systemically which is good news but locally I think we are having a bigger issue here. He does have his appointment next Thursday with vascular. Patient has also been referred to [facility] for further evaluation and treatment of the calcinosis for possible sulfate injections. 03/04/2022: The culture that was performed last week grew out multiple  species including Klebsiella, Morganella, and Pseudomonas aeruginosa. He had been prescribed Bactrim but this was not adequate to cover all of the species and levofloxacin was recommended. He completed the Bactrim but did not pick up the levofloxacin and begin taking it until yesterday. 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 He had arterial studies performed today. He does have evidence of peripheral arterial disease. Results copied here: +-------+-----------+-----------+------------+------------+ ABI/TBIT oday's ABIT oday's TBIPrevious ABIPrevious TBI +-------+-----------+-----------+------------+------------+ Right [place] 0.75    +-------+-----------+-----------+------------+------------+  Left [place] 0.57    +-------+-----------+-----------+------------+------------+ Arterial wall calcification precludes accurate ankle pressures and ABIs. Summary: Right: Resting right ankle-brachial index indicates noncompressible right lower extremity arteries. The right toe-brachial index is normal. Left: Resting left ankle-brachial index indicates noncompressible left lower extremity arteries. The left toe-brachial index is abnormal. He continues to have further tissue breakdown at the right medial calf and there is ample slough accumulation along with necrotic subcutaneous tissue. The left dorsal foot wound has built up slough, as well. The right medial foot wound is nearly closed with just a light layer of eschar over the surface. The right dorsal lateral foot wound has also accumulated a fair amount of slough and remains quite tender. 03/10/2022: He has been taking the prescribed levofloxacin and has about 3 days left of this. The erythema and induration on his right leg has improved. He continues to experience further  breakdown of the right medial calf wound. There is extensive slough and debris accumulation there. There is heavy slough on his left dorsal foot wound, but no odor or purulent drainage. The right medial foot wound appears to be closed. The right dorsal lateral foot wound also has a fair amount of slough but it is less tender than at our last visit. He still does not have an appointment with dermatology. 03/22/2022: The right anterior tibial wound has closed. The right lateral foot wound is nearly closed with just a little bit of slough. The left dorsal foot wound is smaller and continues to have some slough buildup but less so than at prior visits. There is good granulation tissue underlying the slough. Unfortunately the right medial calf wound continues to deteriorate. It has a foul odor today and a lot of necrotic tissue, the surrounding skin is also erythematous and moist. 03/30/2022: The right lateral foot wound continues to contract and just has a little bit of slough and eschar present. The left dorsal foot wound is also smaller with slough overlying good granulation tissue. The right medial calf wound actually looks quite a bit better today; there is no odor and there is much less necrotic tissue although some remains present. We have been using topical gentamicin and he has been taking oral Augmentin. 04/07/2022: The patient saw dermatology at [facility] last week. Unfortunately, it appears that they did not read any of the documentation that was provided. 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 Overall, it was a highly unsatisfactory consultation. In the meantime, however, we were able to identify compounding pharmacy who  could create a topical sodium thiosulfate ointment. The patient has that with him today. Both of the right foot wounds are now closed. The left dorsal foot wound has contracted but still has a layer of slough on the surface. The medial calf wounds on the right all look a little bit better. They still have heavy slough along with the chunks of calcium. Everything is stained purple. 04/14/2022: The wound on his dorsal left foot is a little bit smaller again today. There is still slough accumulation on the surface. The medial calf wounds have quite a bit less slough accumulation today. His right leg, however, is warm and erythematous, concerning for cellulitis. CARSTEN, CARSTARPHEN ([id number]) [id number] Page 4 of 17 04/14/2022: He completed his  course of Augmentin yesterday. The medial calf wound sites are much cleaner today and shallower. There is less fragmented calcium in the wounds. He has a lot of lymphedema fluid-like drainage from these wounds, but no purulent discharge or malodor. The wound on his dorsal left foot has also contracted and is shallower. There is a layer of slough over healthy granulation tissue. 05/05/2022: The left dorsal foot wound is smaller today with less slough and more granulation tissue. The right medial calf wounds are shallower, but have extensive slough and some fat necrosis. The drainage has diminished considerably, however. 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 05/12/2022: The left dorsal foot wound continues to contract and fill with granulation tissue. There is slough on the wound surface. The right medial calf wounds also continue to fill with healthy tissue, but there is remaining slough and fat necrosis present. He had a significant increase in his drainage this week and it was a bright yellow-green color. He also had a new  wound open over his right anterior tibial surface associated with the spike of cutaneous calcium. The leg is more red, as well, concerning for infection. 05/19/2022: His culture returned positive for Pseudomonas. He is currently taking a course of oral levofloxacin. We have also been using topical gentamicin mixed in with his sodium thiosulfate. All of the wounds look better this week. 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 There is slough and nonviable subcutaneous tissue still present. The new anterior tibial wound just has a light layer of slough. The dorsal foot wound also has some slough present and appears a little dry today. 05/26/2022: The right medial leg wound continues to improve. It is feeling with granulation tissue, but still accumulates a fairly thick layer of slough on the surface. The more recent anterior tibial wound has remained quite small, but also has some slough on the surface. The left dorsal foot wound has contracted somewhat and has perimeter epithelialization. He completed his oral levofloxacin. 06/02/2022: The left dorsal foot wound continues to improve significantly. There is just a little slough on the wound surface. The right medial leg wound had black drainage, but it looks like silver alginate was applied last week and I theorized that silver sulfide was created with a combination of the silver alginate and the sodium thiosulfate. It did, however, have a bit of an odor to it and extensive slough accumulation. The small anterior tibial wound also had a bit of slough and nonviable subcutaneous tissue present. The skin of his leg was rather macerated, as well. 06/09/2022: The left dorsal foot wound is smaller again this week with just a light layer of slough on the surface. The right medial leg wound looks substantially better. The leg and periwound skin are no longer red and macerated. There is good granulation tissue forming with less  slough and nonviable tissue. The anterior tibial wound just has a small amount of slough accumulation. 06/16/2022: The left dorsal foot wound continues to contract. His wrap slipped a little bit, however, and his foot is more swollen. The right medial leg wound continues to improve. The underlying tissue is more vital-appearing and there is less slough. He does have substantial drainage. The small anterior tibial wound has a bit of slough accumulation. 06/23/2022: The left dorsal foot wound is about half the size as it was last week. There  is just some light slough on the surface and some eschar buildup around the edges. The right anterior leg wound is small with a little slough on the surface. Unfortunately, the right medial leg wound deteriorated fairly substantially. It is malodorous with gray/green/black discoloration. 06/30/2022: The left dorsal foot wound continues to contract. It is nearly closed with just a light layer of slough present. The right anterior leg wound is about the same size and has a small amount of slough on the surface. The right medial leg wound looks a lot better than it did last week. The odor has abated and the tissue is less pulpy and necrotic-looking. There is still a fairly thick layer of slough present. 12/13; the left dorsal foot wound looks very healthy. There was no need to change the dressing here and no debridement. He has a small area on the right anterior lower leg apparently had not changed much in size. The area on the medial leg is the most problematic area this is large with some depth and a completely nonviable surface today. 07/14/2022: The left dorsal foot wound is smaller today with just a little slough and eschar present. The right anterior lower leg wound is about the same without any significant change. The medial leg wound has a black and green surface and is malodorous. No purulent drainage and the periwound skin is intact. Edema control is  suboptimal. 07/21/2022: The dorsal foot wound continues to contract and is nearly closed with just a little bit of slough and eschar on the surface. The right anterior lower leg wound is shallower today but otherwise the dimensions are unchanged. The medial leg wound looks significantly better although it still has a nonviable surface; the odor and black/green surface has resolved. His culture came back with multiple species sensitive to Augmentin, which was prescribed and a very low level of Pseudomonas, which should be handled by the topical gentamicin we have been using. 07/28/2022: The dorsal foot wound is down to just a very tiny opening with a little eschar on the surface. The right anterior tibial wound is about the same with some slough buildup. The right medial leg wound looks quite a bit better this week. There is thick rubbery slough on the surface, but the odor and drainage has improved significantly. 08/04/2022: The dorsal foot wound is almost healed, but the periwound skin has broken down. This appears to be secondary to moisture and adhesive from the absorbent pad. The right anterior tibial wound is shallower. The right medial leg wound continues to improve. It is smaller, still with rubbery slough on the surface. No malodor or purulent drainage. 08/11/2022: The dorsal foot wound is about the same size. It is a little bit dry with some slough accumulation. The right anterior tibial wound is also unchanged. The right medial leg wound is filling in further with good granulation tissue. Still with some slough buildup on the surface. 08/18/2022: His left leg is bright red, hot, and swollen with cellulitis. The dorsal foot wound actually looks quite good and is superficial with just a little bit of slough accumulation. The right anterior tibial wound is unchanged. The right medial leg wound has filled in with good granulation tissue but has copious amounts of drainage. 08/25/2022: His cellulitis  has responded nicely to the oral antibiotics. His dorsal foot wound is smaller but has a fair amount of slough buildup. The right anterior tibial wound remains stable and unchanged. The right medial leg wound has more granulation tissue  under a layer of slough, but continues to have copious drainage. The drainage is actually causing skin irritation and threatens to result in breakdown. 09/01/2022: He continues to have profuse drainage from his wounds which is resulting in tissue maceration on both the dorsum of his left foot and the periwound distal to his medial leg wound. The dorsal foot wound is nearly healed, despite this. The medial right leg wound is filling in with good granulation tissue. We are working on getting home health assistance to change his dressings more frequently to try and control the drainage better. 09/08/2022: We have had success in controlling his drainage. The tissue maceration has improved significantly and his swelling has come down quite markedly. The medial right leg wound is much more superficial and has substantially less nonviable tissue present. The anterior tibial wound is a little bit larger, however with some nonviable tissue present. 09/15/2022: The dorsal foot wound is nearly closed. The anterior tibial wound measures slightly larger today. The medial right leg wound continues to improve quite dramatically with less nonviable tissue and more robust-looking granulation. Edema control is significantly better. 09/22/2022: The left dorsal foot wound remains open, albeit quite tiny. The anterior tibial wound is a little bit larger again today. The medial right leg wound continues to improve with an increase in robust and healthy granulation tissue and less accumulation of nonviable tissue. 09/29/2022: His home health providers wrapped his left leg extremely tightly and he ended up having to cut the wrap off of his foot. This resulted in his foot being very swollen and that  the dorsal foot wound is a little bit larger today. The anterior tibial wound is about the same size with a little slough on the surface. The medial right leg wound continues to fill in. There are very few areas with any significant depth. There is still slough accumulation. SARIM, ROTHMAN ([id number]) [id number] Page 5 of 17 3/13; Patient has a left dorsal foot wound along with a right anterior tibial wound and right medial wound. We are using silver alginate to the left dorsal foot wound, Santyl and sodium thiosulfate compound ointment to the right lower extremity wounds under 4 layer compression. Patient has no issues or complaints today. 10/13/2022: The left dorsal foot wound is a little bit bigger today, but his foot is also a little bit more swollen. The right medial leg wound has filled in more and has substantially less depth. There is still some nonviable tissue present. The small anterior lower leg wound looks about the same. 10/20/2022: The left dorsal foot wound is epithelializing. There does not appear to be much open at this site. There is some eschar around the edges. The right medial leg wound continues to fill in with granulation tissue. The small anterior lower leg wound is a little bit bigger and there is a chunk of calcium protruding. He continues to have fairly copious drainage from the right leg. 10/27/2022: His home health agency failed to show up at all this past week and as a result, the excessive drainage from his right leg wound has resulted in tissue breakdown. His leg is red and irritated. The wounds themselves look about the same. The wound on his left dorsal foot is nearly closed underneath some eschar. 11/03/2022: We continue to have difficulty with his home health agency. He continues to have significant drainage from his right leg wound which has resulted in even further tissue breakdown. He also was not taking his Lasix as prescribed and  I think  much of the drainage has been his lymphedema fluid choosing the path of least resistance. The wound on his left dorsal foot is closed, but in the process of cleaning the site, dry skin was pulled off and he has a new small superficial wound just adjacent to where the dorsal foot wound was. 11/12/2022: His left dorsal foot is completely healed. The right medial leg continues to deteriorate. He is not taking his Lasix as prescribed, nor is he using his lymphedema pumps at all. As a result, he has massive amounts of drainage saturating his dressings and causing further tissue breakdown on his medial leg. Today, his dressings had a foul odor, as well. 11/19/2022: His right medial leg looks even worse today. There is more erythema and moisture related tissue breakdown. The 2 anterior wounds look a little bit better. The culture that I took last week did produce Pseudomonas and he is currently taking levofloxacin for this. 11/26/2022: There has been significant improvement to his right medial leg. It is far less raw and many of the superficial open areas have epithelialized. He continues to cut the foot portion of his wrap and as result his foot is bulbous and swollen compared to the rest of his leg, where edema control is good. The smaller of the 2 anterior tibial wound is closed, while the larger is contracting somewhat with a bit of slough at the base. 12/03/2022: His leg continues to demonstrate improvement. There has been epithelialization of much of the denuded area. He has a new small anterior tibial wound that has opened around a nidus of calcium. Edema control is improved. 12/10/2022: Continued improvement in the periwound skin. The anterior tibial wounds are about the same, but the medial leg wound has improved further. There is less nonviable tissue present. 12/17/2022: Between his visit on Tuesday with the nurse and today, he has had significantly more drainage which has resulted in some periwound  tissue maceration. I suspect this correlates somewhat with his diuretic use, as he says he takes his diuretic religiously on Saturday, Sunday, and Monday and then does not take it after Tuesday due to being out and about and not being close to easy restroom access. I suspect the excess fluid is just simply draining out of his leg. 12/21/2022: This was supposed to be a nurse visit, but his leg has broken down so significantly, that we have converted to a wound care visit. He did take his diuretics over the weekend, but despite this, he has had copious drainage and all of his tissues have broken down substantially from the right medial leg ulcer all the way down to his ankle and even wrapping posterior to this. There is blue-green staining on his dressing, suggesting ongoing presence of Pseudomonas aeruginosa. 12/24/2022: The wound already looks better than it did 2 days ago. He has been taking ciprofloxacin and the Urgo clean dressing we applied did a much better job of wicking the drainage away from his leg. There is slough on the entire surface of the medial leg wound, but the erythema and induration has improved in the past couple of days. 12/31/2022: Continued improvement of the wound. He says that the pain has basically abated since being on ciprofloxacin. Still with fairly heavy drainage and slough accumulation. 01/07/2023: Overall continued improvement. Still with heavy slough buildup, but less drainage. 01/14/2023: There is a little bit of improvement. The skin is still looking a bit excoriated. He has a few more days left of  ciprofloxacin. 01/21/2023: There has been significant improvement. The periwound skin is in much better condition. The ulcers are shallower and actually have some good granulation tissue present under some slough. 01/28/2023: The wound continues to improve. All of the open areas measured smaller today. There is slough accumulation on the surfaces. His drainage has decreased  markedly. 02/04/2023: Continued improvement in the wound. Drainage remains significantly diminished. Slough accumulation is present but to a lesser extent. Periwound skin continues to improve. Edema control is good. 02/18/2023: The heavy drainage persists, although it is was not as significant today as it was at his nurse visit on Tuesday. The drainage is yellow and does not have the blue-green discoloration suggestive of Pseudomonas. The actual wounds on his leg are getting smaller, but the periwound skin is quite excoriated. 02/25/2023: His periwound skin is quite excoriated and he has a lot of clear yellow drainage. The main wounds are filling in further and epithelializing. 03/04/2023: The excoriation of the periwound skin has improved. Most of that area has dried up. The main wounds have less slough in them today. 03/11/2023: The main wounds have accumulated a little bit of slough. The periwound excoriation continues to improve. Unfortunately, he suffered a fall over the previous weekend and has abrasions on his knees bilaterally and on his right wrist and upper arm. In addition, the skin on his left lower leg apparently blistered and opened, leaving several superficial wounds on this extremity. He has been approved for the [facility] and Press wound VAC and has the equipment with him today. 03/18/2023: He has extensive excoriation to the periwound skin on the right medial lower leg. The actual wounds themselves are smaller with slough accumulation. The top of his left foot has begun weeping again. Using the [facility] and Press wound VAC, he says that he through 4 canisters. He seems to be very fluid overloaded, without any significant dyspnea. 03/25/2023: The wound to his left anterior tibial surface has healed. The left dorsal foot wound has gotten smaller and has a small area of hypertrophic granulation tissue present. The periwound skin on the right medial lower leg looks slightly better, but still appears  excoriated. The actual wounds continue to contract but are still accumulating quite a bit of slough. The tiny right anterior leg wounds are contracting. 04/01/2023: The left dorsal foot wound got a little bit bigger this week. There is slough accumulation. The periwound skin on the right medial lower leg continues to improve. The main medial right lower leg wounds have a dark gray discoloration and there is a strong odor coming from them. The back of his right lower leg just above the ankle also has a little bit of new breakdown where he is draining edema fluid. The small anterior leg wounds are stable. [facility], LIPTON ([id number]) [id number] Page 6 of 17 04/08/2023: Since starting antibiotics last week, there has been tremendous improvement. The left dorsal foot wound is nearly closed with just a little bit of eschar on the surface. The right medial lower leg is less excoriated and red and he has had substantially less drainage. The amount of slough on the open areas of his wounds has decreased markedly and there is no longer any odor. The back of his right lower leg that was newly open last week has dried up, but is not completely closed yet. 04/15/2023: The improvement continues with significantly less drainage, resulting in less excoriation and irritation. The back of his right lower leg is closed, as is the left  dorsal foot wound. The open areas on the right medial leg are smaller. 04/22/2023: His drainage has diminished significantly. The tissue maceration and excoriation surrounding the right medial leg wound has also improved substantially. There is slough on all of the open wound surfaces. He does have a bit of an odor coming from his wound today, but there is no purulent drainage nor any significant discoloration of the drainage in his VAC. 04/29/2023: He has a new wound on his left lower anterior tibial surface. This was a blister that came up and then ruptured. There is  thin slough on the wound surface but no concern for infection. He has had significantly diminished drainage from his right medial leg ulcers. He was previously going through 4 canisters a day secondary to drainage and this week, he has used only 1 for the entire week. The open areas on his right medial leg are smaller and fewer in number. There is slough on all of the surfaces. No malodor appreciated. 05/06/2023: The wound on his left anterior tibial surface has epithelialized significantly. There is just a small area open in the center. The wound is clean without any slough or other debris accumulation. The drainage on the right leg has almost completely dry. There is slough on the open wound surfaces with significantly more epithelialized between the open sites. He continues on levofloxacin. 05/13/2023: His left lower leg wound is healed. The right lower leg wound continues to improve. The drainage has been minimal and he has not been in the wound VAC for over a week now. There is better skin filling between the open areas and less slough accumulation on the open surfaces. 05/20/2023: The anterior leg wounds are closing up and there is no slough on the open areas that remain. His medial leg wound was measured longer today, but this appears to include some stuck on silver alginate and dry eschar, rather than the actual open wound areas. There is slough accumulation on the open surfaces. Edema control is good and his drainage has not been excessive. 05/27/2023: His wrap slipped up on his foot and he has a new small ulcer on the dorsal aspect of his right foot. The other wounds are basically stable with the usual thick slough accumulation. Drainage remains well-controlled. Edema control is good. Electronic Signature(s) Signed: 05/27/2023 12:04:04 PM By: Duanne Guess MD FACS Entered By: Duanne Guess on 05/27/2023  09:04:04 -------------------------------------------------------------------------------- Physical Exam Details Patient Name: Date of Service: NICOLAI, LABONTE [name] 05/27/2023 11:00 A M Medical Record Number: [id number] Patient Account Number: [id number] Date of Birth/Sex: Treating RN: [birth year] (72 y.o. M) Primary Care Provider: [name] Other Clinician: Referring Provider: Treating Provider/Extender: [name] in Treatment: 25 Constitutional Slightly hypertensive. Bradycardic, asymptomatic. . . no acute distress. Respiratory Normal work of breathing on room air.. Notes 05/27/2023: His wrap slipped up on his foot and he has a new small ulcer on the dorsal aspect of his right foot. The other wounds are basically stable with the usual thick slough accumulation. Drainage remains well-controlled. Edema control is good. Electronic Signature(s) Signed: 05/27/2023 12:05:31 PM By: Duanne Guess MD FACS Entered By: Duanne Guess on 05/27/2023 09:05:31 -------------------------------------------------------------------------------- Physician Orders Details Patient Name: Date of Service: [name] 05/27/2023 11:00 A M Medical Record Number: [id number] Patient Account Number: [id number] Date of Birth/Sex: Treating RN: 12/26/1950 (72 y.o. 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 - Dr. [name] RM 1 Return A Anesthetic (In clinic) Topical Lidocaine 4% applied to wound bed Bathing/ Shower/ Hygiene May shower with protection but do not get wound dressing(s) wet. Protect dressing(s) with water repellant cover (for example, large plastic bag) or a cast cover and may then take shower. Lymphedema Treatment Plan - Exercise, Compression and Elevation Bilateral Lower Extremities Exercise daily as tolerated. (Walking, ROM, Calf Pumps and Toe Taps) Compression Garments, Class II. Wear daily when out of bed. May remove at bedtime Compression Wraps as ordered Elevate legs 30 - 60 minutes at or above heart level at least 3 - 4 times daily as able/tolerated Avoid standing for long periods and elevate leg(s) parallel to the floor when sitting Wound Treatment Wound #11 - Foot Wound Laterality: Dorsal, Right Prim Dressing: Maxorb Extra Ag+ Alginate Dressing, 2x2 (in/in) 1 x Per Week/30 Days ary Discharge Instructions: Apply to wound bed as instructed Secondary Dressing: Woven Gauze Sponge, Non-Sterile 4x4 in 1 x Per Week/30 Days Discharge Instructions: Apply over primary dressing as directed. Wound #3 - Lower Leg Wound Laterality: Right, Medial Cleanser: Soap and Water 1 x Per Week/30 Days Discharge Instructions: May shower and wash wound with dial antibacterial soap and water prior to dressing change. Cleanser: Wound Cleanser 1 x Per Week/30 Days Discharge Instructions: Cleanse the wound with wound cleanser prior to applying a clean dressing using gauze sponges, not tissue or cotton balls. Peri-Wound Care: Skin Prep 1 x Per Week/30  Days Discharge Instructions: cavelon to macerated wound margins Peri-Wound Care: Sween Lotion (Moisturizing lotion) 1 x Per Week/30 Days Discharge Instructions: Apply moisturizing lotion as directed Prim Dressing: Maxorb Extra Ag+ Alginate Dressing, 4x4.75 (in/in) 1 x Per Week/30 Days ary Discharge Instructions: Apply to excoriated weeping areas Secondary Dressing: ABD Pad, 8x10 1 x Per Week/30 Days Discharge Instructions: Apply over primary dressing as directed. Compression Wrap: ThreePress (3 layer compression wrap) 1 x Per Week/30 Days Discharge Instructions: Apply three layer compression as directed. Compression Wrap: Unnaboot w/Calamine, 4x10 (in/yd) 1 x Per Week/30 Days Discharge Instructions: Apply Unnaboot on distal foot and top of lower leg Wound #6 - Lower Leg Wound Laterality: Right, Anterior Cleanser: Soap and Water 1 x Per Week/30 Days Discharge Instructions: May shower and wash wound with dial antibacterial soap and water prior to dressing change. KARLIS, CREGG ([id number]) [id number] Page 8 of 17 Cleanser: Wound Cleanser 1 x Per Week/30 Days Discharge Instructions: Cleanse the wound with wound cleanser prior to applying a clean dressing using gauze sponges, not tissue or cotton balls. Peri-Wound Care: Sween Lotion (Moisturizing lotion) 1 x Per Week/30 Days Discharge Instructions: Apply moisturizing lotion as directed Prim Dressing: Maxorb Extra Ag+ Alginate Dressing, 2x2 (in/in) 1 x Per Week/30 Days ary Discharge Instructions: Apply to wound bed as instructed Secondary Dressing: ABD Pad, 8x10 1 x Per Week/30 Days Discharge Instructions: Apply over primary dressing as directed. Compression  Wrap: ThreePress (3 layer compression wrap) 1 x Per Week/30 Days Discharge Instructions: Apply three layer compression as directed. Patient Medications llergies: No Known Allergies A Notifications Medication Indication Start End 05/27/2023 levofloxacin DOSE  oral 750 mg tablet - 1 tab p.o. daily x 30 days Electronic Signature(s) Signed: 05/27/2023 12:11:02 PM By: Duanne Guess MD FACS Previous Signature: 05/27/2023 12:06:25 PM Version By: Duanne Guess MD FACS Entered By: Duanne Guess on 05/27/2023 09:06:43 -------------------------------------------------------------------------------- Problem List Details Patient Name: Date of Service: [name] 05/27/2023 11:00 A M Medical Record Number: [id number] Patient Account Number: [id number] Date of Birth/Sex: Treating RN: Oct 25, 1950 (71 y.o. 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 02/09/2022 No Yes I89.0 Lymphedema, not elsewhere classified 02/09/2022 No Yes I87.2 Venous insufficiency (chronic) (peripheral) 02/09/2022 No Yes Z86.718 Personal history of other venous thrombosis and embolism 02/09/2022 No Yes KHALIL, SZCZEPANIK ([id number]) [id number] Page 9 of 17 I10 Essential (primary) hypertension 02/09/2022 No Yes I25.10 Atherosclerotic heart disease of native coronary artery without angina pectoris 02/09/2022 No Yes Inactive Problems Resolved Problems ICD-10 Code Description Active Date Resolved Date L97.822 Non-pressure chronic ulcer of other part of left lower leg with fat layer exposed 04/29/2023 04/29/2023 L97.821 Non-pressure chronic ulcer of other part of left lower leg limited to breakdown of skin 03/11/2023 03/11/2023 L98.492 Non-pressure chronic ulcer of skin of other sites with fat layer exposed 03/11/2023 03/11/2023 L97.521  Non-pressure chronic ulcer of other part of left foot limited to breakdown of skin 03/18/2023 02/09/2022 Electronic Signature(s) Signed: 05/27/2023 12:02:21 PM By: Duanne Guess MD FACS Entered By: Duanne Guess on 05/27/2023 09:02:21 -------------------------------------------------------------------------------- Progress Note Details Patient Name: Date of Service: [name] 05/27/2023 11:00 A M Medical Record Number: [id number] Patient Account Number: [id number] Date of Birth/Sex: Treating RN: [birth year] (72 y.o. M) Primary Care Provider: [name] Other Clinician: Referring Provider: Treating Provider/Extender: [name] in Treatment: 42 Subjective Chief Complaint Information obtained from Patient Patient seen for complaints of Non-Healing Wounds. History of Present Illness (HPI) ADMISSION 02/09/2022 This is a 73 year old man who recently moved to [place] from [place]. He has a history of of coronary artery disease and prior left popliteal vein DVT . He is not diabetic and does not smoke. 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 He has had multiple spinal surgeries and has limited mobility. He primarily uses a motorized wheelchair. He has had lymphedema for many years and does have lymphedema pumps although he says he does not use them frequently. He has wounds on his bilateral lower extremities. He was receiving care in [place] for these prior to his move. They were apparently packing his wounds with Betadine soaked gauze. He has worn compression wraps in the past but not recently. He did say that they helped tremendously. In clinic today, his right ABI is noncompressible but has good signals; the left ABI was 1.17. No formal vascular studies have been performed. On his left dorsal foot, there is a circular lesion that exposes the fat layer. There is fairly heavy slough accumulation within the wound, but no significant  odor. On his right medial calf, he has multiple  pinholes that have slough buildup in them and probe for about 2 to 4 mm in depth. They are draining clear fluid. On his right lateral midfoot, he has ulceration consistent with venous stasis. It is geographic and has slough accumulation. 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 02/17/2022: All of the wounds are little bit larger today and have more slough accumulation. Today, the medial calf openings are larger and probe to something that feels calcified. There is odor coming from his wounds. His vascular studies are scheduled for August 9 and 10. 02-24-2022 upon evaluation today patient presents for follow-up concerning his ongoing issues here with his lower extremities. With that being said he does have [place], [name] ([id number]) [id number] Page 10 of 17 significant problems with what appears to be cellulitis unfortunately the PCR culture that was sent last week got crushed by UPS in transit. With that being said we are going to reobtain a culture today although I am actually to do it on the right medial leg as this seems to be the most inflamed area today where there is some questionable drainage as well. I am going to remove some of the calcium deposits which are loosening obtain a deeper culture from this area. Fortunately I do not see any evidence of active infection systemically which is good news but locally I think we are having a bigger issue here. He does have his appointment next Thursday with vascular. Patient has also been referred to [facility] for further evaluation and treatment of the calcinosis for possible sulfate injections. 03/04/2022: The culture that was performed last week grew out multiple species including Klebsiella, Morganella, and Pseudomonas aeruginosa. He had been prescribed Bactrim but this was not adequate  to cover all of the species and levofloxacin was recommended. He completed the Bactrim but did not pick up the levofloxacin and begin taking it until yesterday. 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 He had arterial studies performed today. He does have evidence of peripheral arterial disease. Results copied here: +-------+-----------+-----------+------------+------------+ ABI/TBIT oday's ABIT oday's TBIPrevious ABIPrevious TBI +-------+-----------+-----------+------------+------------+ Right [place] 0.75    +-------+-----------+-----------+------------+------------+ Left [place] 0.57    +-------+-----------+-----------+------------+------------+ Arterial wall calcification precludes accurate ankle pressures and ABIs. Summary: Right: Resting right ankle-brachial index indicates noncompressible right lower extremity arteries. The right toe-brachial index is normal. Left: Resting left ankle-brachial index indicates noncompressible left lower extremity arteries. The left toe-brachial index is abnormal. He continues to have further tissue breakdown at the right medial calf and there is ample slough accumulation along with necrotic subcutaneous tissue. The left dorsal foot wound has built up slough, as well. The right medial foot wound is nearly closed with just a light layer of eschar over the surface. The right dorsal lateral foot wound has also accumulated a fair amount of slough and remains quite tender. 03/10/2022: He has been taking the prescribed levofloxacin and has about 3 days left of this. The erythema and induration on his right leg has improved. He continues to experience further breakdown of the right medial calf wound. There is extensive slough and debris accumulation there. There is heavy slough on his left  dorsal foot wound, but no odor or purulent drainage. The right medial foot wound appears to be closed. The right dorsal lateral foot wound also has  a fair amount of slough but it is less tender than at our last visit. He still does not have an appointment with dermatology. 03/22/2022: The right anterior tibial wound has closed. The right lateral foot wound is nearly closed with just a little bit of slough. The left dorsal foot wound is smaller and continues to have some slough buildup but less so than at prior visits. There is good granulation tissue underlying the slough. Unfortunately the right medial calf wound continues to deteriorate. It has a foul odor today and a lot of necrotic tissue, the surrounding skin is also erythematous and moist. 03/30/2022: The right lateral foot wound continues to contract and just has a little bit of slough and eschar present. The left dorsal foot wound is also smaller with slough overlying good granulation tissue. The right medial calf wound actually looks quite a bit better today; there is no odor and there is much less necrotic tissue although some remains present. We have been using topical gentamicin and he has been taking oral Augmentin. 04/07/2022: The patient saw dermatology at [facility] last week. Unfortunately, it appears that they did not read any of the documentation that was provided. 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 Overall, it was a highly unsatisfactory consultation. In the meantime, however, we were able to identify compounding pharmacy who could create a topical sodium thiosulfate ointment. The patient has that with him today. Both of the right foot wounds are now closed. The  left dorsal foot wound has contracted but still has a layer of slough on the surface. The medial calf wounds on the right all look a little bit better. They still have heavy slough along with the chunks of calcium. Everything is stained purple. 04/14/2022: The wound on his dorsal left foot is a little bit smaller again today. There is still slough accumulation on the surface. The medial calf wounds have quite a bit less slough accumulation today. His right leg, however, is warm and erythematous, concerning for cellulitis. 04/14/2022: He completed his course of Augmentin yesterday. The medial calf wound sites are much cleaner today and shallower. There is less fragmented calcium in the wounds. He has a lot of lymphedema fluid-like drainage from these wounds, but no purulent discharge or malodor. The wound on his dorsal left foot has also contracted and is shallower. There is a layer of slough over healthy granulation tissue. 05/05/2022: The left dorsal foot wound is smaller today with less slough and more granulation tissue. The right medial calf wounds are shallower, but have extensive slough and some fat necrosis. The drainage has diminished considerably, however. 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 05/12/2022: The left dorsal foot wound continues to contract and fill with granulation tissue. There is slough on the wound surface. The right medial calf wounds also continue to fill with healthy tissue, but there is remaining slough and fat necrosis present. He had a significant increase in his drainage this week and it was a bright yellow-green color. He also had a new wound open over his right anterior tibial surface associated with the spike of cutaneous calcium. The leg is  more red, as well, concerning for infection. 05/19/2022: His culture returned positive for Pseudomonas. He  is currently taking a course of oral levofloxacin. We have also been using topical gentamicin mixed in with his sodium thiosulfate. All of the wounds look better this week. 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 There is slough and nonviable subcutaneous tissue still present. The new anterior tibial wound just has a light layer of slough. The dorsal foot wound also has some slough present and appears a little dry today. 05/26/2022: The right medial leg wound continues to improve. It is feeling with granulation tissue, but still accumulates a fairly thick layer of slough on the surface. The more recent anterior tibial wound has remained quite small, but also has some slough on the surface. The left dorsal foot wound has contracted somewhat and has perimeter epithelialization. He completed his oral levofloxacin. 06/02/2022: The left dorsal foot wound continues to improve significantly. There is just a little slough on the wound surface. The right medial leg wound had black drainage, but it looks like silver alginate was applied last week and I theorized that silver sulfide was created with a combination of the silver alginate and the sodium thiosulfate. It did, however, have a bit of an odor to it and extensive slough accumulation. The small anterior tibial wound also had a bit of slough and nonviable subcutaneous tissue present. The skin of his leg was rather macerated, as well. 06/09/2022: The left dorsal foot wound is smaller again this week with just a light layer of slough on the surface. The right medial leg wound looks substantially better. The leg and periwound skin are no longer red and macerated. There is good granulation tissue forming with less slough and nonviable tissue. The anterior tibial wound just has a small amount of slough accumulation. AZELL, BILL ([id number]) [id number] Page 11 of 17 06/16/2022: The left dorsal foot  wound continues to contract. His wrap slipped a little bit, however, and his foot is more swollen. The right medial leg wound continues to improve. The underlying tissue is more vital-appearing and there is less slough. He does have substantial drainage. The small anterior tibial wound has a bit of slough accumulation. 06/23/2022: The left dorsal foot wound is about half the size as it was last week. There is just some light slough on the surface and some eschar buildup around the edges. The right anterior leg wound is small with a little slough on the surface. Unfortunately, the right medial leg wound deteriorated fairly substantially. It is malodorous with gray/green/black discoloration. 06/30/2022: The left dorsal foot wound continues to contract. It is nearly closed with just a light layer of slough present. The right anterior leg wound is about the same size and has a small amount of slough on the surface. The right medial leg wound looks a lot better than it did last week. The odor has abated and the tissue is less pulpy and necrotic-looking. There is still a fairly thick layer of slough present. 12/13; the left dorsal foot wound looks very healthy. There was no need to change the dressing here and no debridement. He has a small area on the right anterior lower leg apparently had not changed much in size. The area on the medial leg is the most problematic area this is large with some depth and a completely nonviable surface today. 07/14/2022: The left dorsal foot wound is smaller today with just a little slough and eschar present. The  right anterior lower leg wound is about the same without any significant change. The medial leg wound has a black and green surface and is malodorous. No purulent drainage and the periwound skin is intact. Edema control is suboptimal. 07/21/2022: The dorsal foot wound continues to contract and is nearly closed with just a little bit of slough and eschar on the  surface. The right anterior lower leg wound is shallower today but otherwise the dimensions are unchanged. The medial leg wound looks significantly better although it still has a nonviable surface; the odor and black/green surface has resolved. His culture came back with multiple species sensitive to Augmentin, which was prescribed and a very low level of Pseudomonas, which should be handled by the topical gentamicin we have been using. 07/28/2022: The dorsal foot wound is down to just a very tiny opening with a little eschar on the surface. The right anterior tibial wound is about the same with some slough buildup. The right medial leg wound looks quite a bit better this week. There is thick rubbery slough on the surface, but the odor and drainage has improved significantly. 08/04/2022: The dorsal foot wound is almost healed, but the periwound skin has broken down. This appears to be secondary to moisture and adhesive from the absorbent pad. The right anterior tibial wound is shallower. The right medial leg wound continues to improve. It is smaller, still with rubbery slough on the surface. No malodor or purulent drainage. 08/11/2022: The dorsal foot wound is about the same size. It is a little bit dry with some slough accumulation. The right anterior tibial wound is also unchanged. The right medial leg wound is filling in further with good granulation tissue. Still with some slough buildup on the surface. 08/18/2022: His left leg is bright red, hot, and swollen with cellulitis. The dorsal foot wound actually looks quite good and is superficial with just a little bit of slough accumulation. The right anterior tibial wound is unchanged. The right medial leg wound has filled in with good granulation tissue but has copious amounts of drainage. 08/25/2022: His cellulitis has responded nicely to the oral antibiotics. His dorsal foot wound is smaller but has a fair amount of slough buildup. The right  anterior tibial wound remains stable and unchanged. The right medial leg wound has more granulation tissue under a layer of slough, but continues to have copious drainage. The drainage is actually causing skin irritation and threatens to result in breakdown. 09/01/2022: He continues to have profuse drainage from his wounds which is resulting in tissue maceration on both the dorsum of his left foot and the periwound distal to his medial leg wound. The dorsal foot wound is nearly healed, despite this. The medial right leg wound is filling in with good granulation tissue. We are working on getting home health assistance to change his dressings more frequently to try and control the drainage better. 09/08/2022: We have had success in controlling his drainage. The tissue maceration has improved significantly and his swelling has come down quite markedly. The medial right leg wound is much more superficial and has substantially less nonviable tissue present. The anterior tibial wound is a little bit larger, however with some nonviable tissue present. 09/15/2022: The dorsal foot wound is nearly closed. The anterior tibial wound measures slightly larger today. The medial right leg wound continues to improve quite dramatically with less nonviable tissue and more robust-looking granulation. Edema control is significantly better. 09/22/2022: The left dorsal foot wound remains open, albeit  quite tiny. The anterior tibial wound is a little bit larger again today. The medial right leg wound continues to improve with an increase in robust and healthy granulation tissue and less accumulation of nonviable tissue. 09/29/2022: His home health providers wrapped his left leg extremely tightly and he ended up having to cut the wrap off of his foot. This resulted in his foot being very swollen and that the dorsal foot wound is a little bit larger today. The anterior tibial wound is about the same size with a little slough on the  surface. The medial right leg wound continues to fill in. There are very few areas with any significant depth. There is still slough accumulation. 3/13; Patient has a left dorsal foot wound along with a right anterior tibial wound and right medial wound. We are using silver alginate to the left dorsal foot wound, Santyl and sodium thiosulfate compound ointment to the right lower extremity wounds under 4 layer compression. Patient has no issues or complaints today. 10/13/2022: The left dorsal foot wound is a little bit bigger today, but his foot is also a little bit more swollen. The right medial leg wound has filled in more and has substantially less depth. There is still some nonviable tissue present. The small anterior lower leg wound looks about the same. 10/20/2022: The left dorsal foot wound is epithelializing. There does not appear to be much open at this site. There is some eschar around the edges. The right medial leg wound continues to fill in with granulation tissue. The small anterior lower leg wound is a little bit bigger and there is a chunk of calcium protruding. He continues to have fairly copious drainage from the right leg. 10/27/2022: His home health agency failed to show up at all this past week and as a result, the excessive drainage from his right leg wound has resulted in tissue breakdown. His leg is red and irritated. The wounds themselves look about the same. The wound on his left dorsal foot is nearly closed underneath some eschar. 11/03/2022: We continue to have difficulty with his home health agency. He continues to have significant drainage from his right leg wound which has resulted in even further tissue breakdown. He also was not taking his Lasix as prescribed and I think much of the drainage has been his lymphedema fluid choosing the path of least resistance. The wound on his left dorsal foot is closed, but in the process of cleaning the site, dry skin was pulled off and he  has a new small superficial wound just adjacent to where the dorsal foot wound was. 11/12/2022: His left dorsal foot is completely healed. The right medial leg continues to deteriorate. He is not taking his Lasix as prescribed, nor is he using his lymphedema pumps at all. As a result, he has massive amounts of drainage saturating his dressings and causing further tissue breakdown on his medial leg. Today, his dressings had a foul odor, as well. 11/19/2022: His right medial leg looks even worse today. There is more erythema and moisture related tissue breakdown. The 2 anterior wounds look a little bit better. The culture that I took last week did produce Pseudomonas and he is currently taking levofloxacin for this. 11/26/2022: There has been significant improvement to his right medial leg. It is far less raw and many of the superficial open areas have epithelialized. He continues to cut the foot portion of his wrap and as result his foot is bulbous and swollen compared  to the rest of his leg, where edema control is good. The TYRIE, PORZIO ([id number]) [id number] Page 12 of 17 smaller of the 2 anterior tibial wound is closed, while the larger is contracting somewhat with a bit of slough at the base. 12/03/2022: His leg continues to demonstrate improvement. There has been epithelialization of much of the denuded area. He has a new small anterior tibial wound that has opened around a nidus of calcium. Edema control is improved. 12/10/2022: Continued improvement in the periwound skin. The anterior tibial wounds are about the same, but the medial leg wound has improved further. There is less nonviable tissue present. 12/17/2022: Between his visit on Tuesday with the nurse and today, he has had significantly more drainage which has resulted in some periwound tissue maceration. I suspect this correlates somewhat with his diuretic use, as he says he takes his diuretic religiously on  Saturday, Sunday, and Monday and then does not take it after Tuesday due to being out and about and not being close to easy restroom access. I suspect the excess fluid is just simply draining out of his leg. 12/21/2022: This was supposed to be a nurse visit, but his leg has broken down so significantly, that we have converted to a wound care visit. He did take his diuretics over the weekend, but despite this, he has had copious drainage and all of his tissues have broken down substantially from the right medial leg ulcer all the way down to his ankle and even wrapping posterior to this. There is blue-green staining on his dressing, suggesting ongoing presence of Pseudomonas aeruginosa. 12/24/2022: The wound already looks better than it did 2 days ago. He has been taking ciprofloxacin and the Urgo clean dressing we applied did a much better job of wicking the drainage away from his leg. There is slough on the entire surface of the medial leg wound, but the erythema and induration has improved in the past couple of days. 12/31/2022: Continued improvement of the wound. He says that the pain has basically abated since being on ciprofloxacin. Still with fairly heavy drainage and slough accumulation. 01/07/2023: Overall continued improvement. Still with heavy slough buildup, but less drainage. 01/14/2023: There is a little bit of improvement. The skin is still looking a bit excoriated. He has a few more days left of ciprofloxacin. 01/21/2023: There has been significant improvement. The periwound skin is in much better condition. The ulcers are shallower and actually have some good granulation tissue present under some slough. 01/28/2023: The wound continues to improve. All of the open areas measured smaller today. There is slough accumulation on the surfaces. His drainage has decreased markedly. 02/04/2023: Continued improvement in the wound. Drainage remains significantly diminished. Slough accumulation is  present but to a lesser extent. Periwound skin continues to improve. Edema control is good. 02/18/2023: The heavy drainage persists, although it is was not as significant today as it was at his nurse visit on Tuesday. The drainage is yellow and does not have the blue-green discoloration suggestive of Pseudomonas. The actual wounds on his leg are getting smaller, but the periwound skin is quite excoriated. 02/25/2023: His periwound skin is quite excoriated and he has a lot of clear yellow drainage. The main wounds are filling in further and epithelializing. 03/04/2023: The excoriation of the periwound skin has improved. Most of that area has dried up. The main wounds have less slough in them today. 03/11/2023: The main wounds have accumulated a little bit of slough. The periwound excoriation  continues to improve. Unfortunately, he suffered a fall over the previous weekend and has abrasions on his knees bilaterally and on his right wrist and upper arm. In addition, the skin on his left lower leg apparently blistered and opened, leaving several superficial wounds on this extremity. He has been approved for the [facility] wound VAC and has the equipment with him today. 03/18/2023: He has extensive excoriation to the periwound skin on the right medial lower leg. The actual wounds themselves are smaller with slough accumulation. The top of his left foot has begun weeping again. Using the [facility] wound VAC, he says that he through 4 canisters. He seems to be very fluid overloaded, without any significant dyspnea. 03/25/2023: The wound to his left anterior tibial surface has healed. The left dorsal foot wound has gotten smaller and has a small area of hypertrophic granulation tissue present. The periwound skin on the right medial lower leg looks slightly better, but still appears excoriated. The actual wounds continue to contract but are still accumulating quite a bit of slough. The tiny right anterior leg  wounds are contracting. 04/01/2023: The left dorsal foot wound got a little bit bigger this week. There is slough accumulation. The periwound skin on the right medial lower leg continues to improve. The main medial right lower leg wounds have a dark gray discoloration and there is a strong odor coming from them. The back of his right lower leg just above the ankle also has a little bit of new breakdown where he is draining edema fluid. The small anterior leg wounds are stable. 04/08/2023: Since starting antibiotics last week, there has been tremendous improvement. The left dorsal foot wound is nearly closed with just a little bit of eschar on the surface. The right medial lower leg is less excoriated and red and he has had substantially less drainage. The amount of slough on the open areas of his wounds has decreased markedly and there is no longer any odor. The back of his right lower leg that was newly open last week has dried up, but is not completely closed yet. 04/15/2023: The improvement continues with significantly less drainage, resulting in less excoriation and irritation. The back of his right lower leg is closed, as is the left dorsal foot wound. The open areas on the right medial leg are smaller. 04/22/2023: His drainage has diminished significantly. The tissue maceration and excoriation surrounding the right medial leg wound has also improved substantially. There is slough on all of the open wound surfaces. He does have a bit of an odor coming from his wound today, but there is no purulent drainage nor any significant discoloration of the drainage in his VAC. 04/29/2023: He has a new wound on his left lower anterior tibial surface. This was a blister that came up and then ruptured. There is thin slough on the wound surface but no concern for infection. He has had significantly diminished drainage from his right medial leg ulcers. He was previously going through 4 canisters a day secondary to  drainage and this week, he has used only 1 for the entire week. The open areas on his right medial leg are smaller and fewer in number. There is slough on all of the surfaces. No malodor appreciated. 05/06/2023: The wound on his left anterior tibial surface has epithelialized significantly. There is just a small area open in the center. The wound is clean without any slough or other debris accumulation. The drainage on the right  leg has almost completely dry. There is slough on the open wound surfaces with significantly more epithelialized between the open sites. He continues on levofloxacin. 05/13/2023: His left lower leg wound is healed. The right lower leg wound continues to improve. The drainage has been minimal and he has not been in the wound VAC for over a week now. There is better skin filling between the open areas and less slough accumulation on the open surfaces. 05/20/2023: The anterior leg wounds are closing up and there is no slough on the open areas that remain. His medial leg wound was measured longer today, but this appears to include some stuck on silver alginate and dry eschar, rather than the actual open wound areas. There is slough accumulation on the open surfaces. Edema control is good and his drainage has not been excessive. 05/27/2023: His wrap slipped up on his foot and he has a new small ulcer on the dorsal aspect of his right foot. The other wounds are basically stable with the usual thick slough accumulation. Drainage remains well-controlled. Edema control is good. ANDIE, MORTIMER ([id number]) [id number] Page 13 of 17 Patient History Information obtained from Patient, Chart. Family History Cancer - Mother, Heart Disease - Father,Mother, Hypertension - Father,Siblings, Lung Disease - Siblings, No family history of Diabetes, Hereditary Spherocytosis, Kidney Disease, Seizures, Stroke, Thyroid Problems, Tuberculosis. Social History Former smoker -  quit 1991, Marital Status - Married, Alcohol Use - Moderate, Drug Use - No History, Caffeine Use - Daily - coffee. Medical History Eyes Patient has history of Cataracts - right eye removed Denies history of Glaucoma, Optic Neuritis Ear/Nose/Mouth/Throat Denies history of Chronic sinus problems/congestion, Middle ear problems Hematologic/Lymphatic Patient has history of Anemia - macrocytic, Lymphedema Cardiovascular Patient has history of Congestive Heart Failure, Deep Vein Thrombosis - left leg, Hypertension, Peripheral Venous Disease Endocrine Denies history of Type I Diabetes, Type II Diabetes Genitourinary Denies history of End Stage Renal Disease Integumentary (Skin) Denies history of History of Burn Oncologic Denies history of Received Chemotherapy, Received Radiation Psychiatric Denies history of Anorexia/bulimia, Confinement Anxiety Hospitalization/Surgery History - cervical fusion. - lumbar surgery. - coronary stent placement. - lasik eye surgery. - hydrocele excision. Medical A Surgical History Notes nd Constitutional Symptoms (General Health) obesity Ear/Nose/Mouth/Throat hard of hearing Respiratory frozen diaphragm right Cardiovascular hyperlipidemia Musculoskeletal myositis, lumbar DDD, spinal stenosis, cervical facet joint syndrome Objective Constitutional Slightly hypertensive. Bradycardic, asymptomatic. no acute distress. Vitals Time Taken: 11:00 AM, Height: 71 in, Weight: 267 lbs, BMI: 37.2, Temperature: 97.8 F, Pulse: 49 bpm, Respiratory Rate: 18 breaths/min, Blood Pressure: 149/71 mmHg. Respiratory Normal work of breathing on room air.. General Notes: 05/27/2023: His wrap slipped up on his foot and he has a new small ulcer on the dorsal aspect of his right foot. The other wounds are basically stable with the usual thick slough accumulation. Drainage remains well-controlled. Edema control is good. Integumentary (Hair, Skin) Wound #11 status is Open.  Original cause of wound was Gradually Appeared. The date acquired was: 05/27/2023. The wound is located on the Right,Dorsal Foot. The wound measures 0.5cm length x 1cm width x 0.1cm depth; 0.393cm^2 area and 0.039cm^3 volume. There is Fat Layer (Subcutaneous Tissue) exposed. There is no tunneling or undermining noted. There is a medium amount of serosanguineous drainage noted. The wound margin is flat and intact. There is large (67-100%) red granulation within the wound bed. There is no necrotic tissue within the wound bed. The periwound skin appearance had no abnormalities noted for moisture. The periwound skin appearance exhibited:  Scarring, Hemosiderin Staining. Periwound temperature was noted as No Abnormality. Wound #3 status is Open. Original cause of wound was Gradually Appeared. The date acquired was: 12/07/2021. The wound has been in treatment 67 weeks. The wound is located on the Right,Medial Lower Leg. The wound measures 7.5cm length x 5.5cm width x 0.3cm depth; 32.398cm^2 area and 9.719cm^3 volume. There is Fat Layer (Subcutaneous Tissue) exposed. There is no tunneling or undermining noted. There is a medium amount of serosanguineous drainage noted. The wound margin is flat and intact. There is small (1-33%) red granulation within the wound bed. There is a large (67-100%) amount of necrotic tissue within the wound bed including Eschar and Adherent Slough. The periwound skin appearance had no abnormalities noted for texture. The periwound skin appearance had no abnormalities noted for moisture. The periwound skin appearance exhibited: Hemosiderin Staining, Rubor. The periwound skin appearance did not exhibit: Erythema. Periwound temperature was noted as No Abnormality. The periwound has tenderness on palpation. Wound #6 status is Open. Original cause of wound was Gradually Appeared. The date acquired was: 05/12/2022. The wound has been in treatment 54 weeks. The wound is located on the  Right,Anterior Lower Leg. The wound measures 0.4cm length x 0.4cm width x 0.2cm depth; 0.126cm^2 area and 0.025cm^3 volume. There is Fat Layer (Subcutaneous Tissue) exposed. There is no tunneling or undermining noted. There is a small amount of serous drainage noted. The ONUR, MORI ([id number]) [id number] Page 14 of 17 wound margin is epibole. There is small (1-33%) pink granulation within the wound bed. There is a large (67-100%) amount of necrotic tissue within the wound bed including Eschar. The periwound skin appearance had no abnormalities noted for texture. The periwound skin appearance had no abnormalities noted for moisture. The periwound skin appearance exhibited: Hemosiderin Staining. Periwound temperature was noted as No Abnormality. 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 There was a Excisional Skin/Subcutaneous Tissue Debridement with a total area of 0.39 sq cm performed by Duanne Guess, MD. With the following instrument(s): Curette to remove Viable and Non-Viable tissue/material. Material removed includes Subcutaneous Tissue and Biofilm and after achieving pain control using Lidocaine 4% T opical Solution. No specimens were taken. A time out was conducted at 11:35, prior to the start of the procedure. A Minimum amount of bleeding was controlled with Pressure. The procedure was tolerated well with a pain level of 3 throughout  and a pain level of 1 following the procedure. Post Debridement Measurements: 0.5cm length x 1cm width x 0.1cm depth; 0.039cm^3 volume. Character of Wound/Ulcer Post Debridement is improved. Post procedure Diagnosis Wound #11: Same as Pre-Procedure Wound #3 Pre-procedure diagnosis of Wound #3 is a Lymphedema located on the Right,Medial Lower Leg . There was a Excisional Skin/Subcutaneous Tissue Debridement with a total area of 6.48 sq cm performed by Duanne Guess, MD. With the following instrument(s): Curette to remove Non-Viable tissue/material. Material removed includes Eschar, Subcutaneous Tissue, and Slough after achieving pain control using Lidocaine 4% T opical Solution. No specimens were taken. A time out was conducted at 11:35, prior to the start of the procedure.  A Minimum amount of bleeding was controlled with Pressure. The procedure was tolerated well with a pain level of 3 throughout and a pain level of 1 following the procedure. Post Debridement Measurements: 7.5cm length x 5.5cm width x 0.3cm depth; 9.719cm^3 volume. Character of Wound/Ulcer Post Debridement is improved. Post procedure Diagnosis Wound #3: Same as Pre-Procedure Pre-procedure diagnosis of Wound #3 is a Lymphedema located on the Right,Medial Lower Leg . There was a Three Layer Compression Therapy Procedure by [name], RN. Post procedure Diagnosis Wound #3: Same as Pre-Procedure Plan Follow-up Appointments: Return Appointment in 1 week. - Dr. [name] RM 1 Anesthetic: (In clinic) Topical Lidocaine 4% applied to wound bed Bathing/ Shower/ Hygiene: May shower with protection but do not get wound dressing(s) wet. Protect dressing(s) with water repellant cover (for example, large plastic bag) or a cast cover and may then take shower. Lymphedema Treatment Plan - Exercise, Compression and Elevation: Exercise daily as tolerated. (Walking, ROM, Calf Pumps and T T oe aps) Compression Garments, Class II. Wear  daily when out of bed. May remove at bedtime Compression Wraps as ordered Elevate legs 30 - 60 minutes at or above heart level at least 3 - 4 times daily as able/tolerated Avoid standing for long periods and elevate leg(s) parallel to the floor when sitting The following medication(s) was prescribed: levofloxacin oral 750 mg tablet 1 tab p.o. daily x 30 days starting 05/27/2023 WOUND #11: - Foot Wound Laterality: Dorsal, Right Prim Dressing: Maxorb Extra Ag+ Alginate Dressing, 2x2 (in/in) 1 x Per Week/30 Days ary Discharge Instructions: Apply to wound bed as instructed Secondary Dressing: Woven Gauze Sponge, Non-Sterile 4x4 in 1 x Per Week/30 Days Discharge Instructions: Apply over primary dressing as directed. WOUND #3: - Lower Leg Wound Laterality: Right, Medial Cleanser: Soap and Water 1 x Per Week/30 Days Discharge Instructions: May shower and wash wound with dial antibacterial soap and water prior to dressing change. Cleanser: Wound Cleanser 1 x Per Week/30 Days Discharge Instructions: Cleanse the wound with wound cleanser prior to applying a clean dressing using gauze sponges, not tissue or cotton balls. Peri-Wound Care: Skin Prep 1 x Per Week/30 Days Discharge Instructions: cavelon to macerated wound margins DONIVIN, WIRT ([id number]) [id number] Page 15 of 17 Peri-Wound Care: Sween Lotion (Moisturizing lotion) 1 x Per Week/30 Days Discharge Instructions: Apply moisturizing lotion as directed Prim Dressing: Maxorb Extra Ag+ Alginate Dressing, 4x4.75 (in/in) 1 x Per Week/30 Days ary Discharge Instructions: Apply to excoriated weeping areas Secondary Dressing: ABD Pad, 8x10 1 x Per Week/30 Days Discharge Instructions: Apply over primary dressing as directed. Com pression Wrap: ThreePress (3 layer compression wrap) 1 x Per Week/30 Days Discharge Instructions: Apply three layer compression as directed. Com pression Wrap: Unnaboot w/Calamine, 4x10 (in/yd) 1 x  Per Week/30 Days Discharge Instructions: Apply Unnaboot on distal foot and top of lower leg WOUND #6: - Lower Leg Wound Laterality: Right, Anterior Cleanser: Soap and Water 1 x Per Week/30 Days Discharge Instructions: May shower and wash wound with dial antibacterial soap and water prior to dressing change. Cleanser: Wound Cleanser 1 x Per Week/30 Days Discharge Instructions: Cleanse the wound with wound cleanser prior to applying a clean dressing using gauze sponges, not tissue or cotton balls. Peri-Wound Care: Sween Lotion (Moisturizing lotion) 1 x Per Week/30 Days Discharge Instructions: Apply moisturizing lotion as directed Prim Dressing: Maxorb Extra Ag+ Alginate Dressing, 2x2 (in/in) 1 x Per Week/30 Days ary Discharge Instructions: Apply to wound bed as instructed Secondary Dressing: ABD Pad, 8x10 1  x Per Week/30 Days Discharge Instructions: Apply over primary dressing as directed. Com pression Wrap: ThreePress (3 layer compression wrap) 1 x Per Week/30 Days Discharge Instructions: Apply three layer compression as directed. 05/27/2023: His wrap slipped up on his foot and he has a new small ulcer on the dorsal aspect of his right foot. The other wounds are basically stable with the usual thick slough accumulation. Drainage remains well-controlled. Edema control is good. I used a curette to debride slough and subcutaneous tissue from all of his wounds, including the new wound on his dorsal foot. We will continue silver alginate to all sites along with 3 layer compression wrap. Will make sure to apply the first layer of Unna boot at the top and also at the foot portion of his wrap to try and minimize the risk of slippage. He is nearly out of his levofloxacin and given that anytime he is off of it, his wounds worsen, I have renewed for another 30 days. Follow-up in 1 week. Electronic Signature(s) Signed: 05/27/2023 12:08:15 PM By: Duanne Guess MD FACS Entered By: Duanne Guess on  05/27/2023 09:08:14 -------------------------------------------------------------------------------- HxROS Details Patient Name: Date of Service: Annice Needy, [name] 05/27/2023 11:00 A M Medical Record Number: [id number] Patient Account Number: [id number] Date of Birth/Sex: Treating RN: June 18, 1951 (71 y.o. M) Primary Care Provider: [name] Other Clinician: Referring Provider: Treating Provider/Extender: [name] in Treatment: 34 Information Obtained From Patient Chart Constitutional Symptoms (General Health) Medical History: Past Medical History Notes: obesity Eyes Medical History: Positive for: Cataracts - right eye removed Negative for: Glaucoma; Optic Neuritis Ear/Nose/Mouth/Throat Medical History: Negative for: Chronic sinus problems/congestion; Middle ear problems Past Medical History Notes: hard of hearing Hematologic/Lymphatic JERELL, DEMERY ([id number]) [id number] Page 16 of 17 Medical History: Positive for: Anemia - macrocytic; Lymphedema Respiratory Medical History: Past Medical History Notes: frozen diaphragm right Cardiovascular Medical History: Positive for: Congestive Heart Failure; Deep Vein Thrombosis - left leg; Hypertension; Peripheral Venous Disease Past Medical History Notes: hyperlipidemia Endocrine Medical History: Negative for: Type I Diabetes; Type II Diabetes Genitourinary Medical History: Negative for: End Stage Renal Disease Integumentary (Skin) Medical History: Negative for: History of Burn Musculoskeletal Medical History: Past Medical History Notes: myositis, lumbar DDD, spinal stenosis, cervical facet joint syndrome Oncologic Medical History: Negative for: Received Chemotherapy; Received Radiation Psychiatric Medical History: Negative for: Anorexia/bulimia; Confinement Anxiety HBO Extended History Items Eyes: Cataracts Immunizations Pneumococcal Vaccine: Received  Pneumococcal Vaccination: Yes Received Pneumococcal Vaccination On or After 60th Birthday: Yes Implantable Devices No devices added Hospitalization / Surgery History Type of Hospitalization/Surgery cervical fusion lumbar surgery coronary stent placement lasik eye surgery hydrocele excision Family and Social History Cancer: Yes - Mother; Diabetes: No; Heart Disease: Yes - Father,Mother; Hereditary Spherocytosis: No; Hypertension: Yes - Father,Siblings; Kidney Disease: No; Lung Disease: Yes - Siblings; Seizures: No; Stroke: No; Thyroid Problems: No; Tuberculosis: No; Former smoker - quit 1991; Marital Status - Married; Alcohol Use: Moderate; Drug Use: No History; Caffeine Use: Daily - coffee; Financial Concerns: No; Food, Clothing or Shelter Needs: No; Support System Lacking: No; Transportation Concerns: No Electronic Signature(s) Signed: 05/27/2023 12:11:02 PM By: Duanne Guess MD FACS [place], [name] ([id number]) [id number] Page 17 of 17 Entered By: Duanne Guess on 05/27/2023 09:05:05 -------------------------------------------------------------------------------- SuperBill Details Patient Name: Date of Service: BRADYN, SOWARD 05/27/2023 Medical Record Number: [id number] Patient Account Number: [id number] Date of Birth/Sex: Treating RN: [birth year] (72 y.o. M) Primary Care Provider: [name] Other Clinician: Referring Provider: Treating Provider/Extender: [name] in Treatment:  67 Diagnosis Coding ICD-10 Codes Code Description 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ed L97.512 Non-pressure chronic ulcer of other part of right foot with fat layer exposed L94.2 Calcinosis cutis I25.10 Atherosclerotic heart disease of native coronary artery without angina pectoris I89.0 Lymphedema, not elsewhere classified I87.2 Venous insufficiency (chronic) (peripheral) Z86.718  Personal history of other venous thrombosis and embolism I10 Essential (primary) hypertension Facility Procedures : CPT4 Code: [id number] Description: 11042 - DEB SUBQ TISSUE 20 SQ CM/< ICD-10 Diagnosis Description L97.812 Non-pressure chronic ulcer of other part of right lower leg with fat layer exp L97.512 Non-pressure chronic ulcer of other part of right foot with fat layer exposed Modifier: osed Quantity: 1 Physician Procedures : CPT4 Code Description Modifier [id number] 99214 - WC PHYS LEVEL 4 - EST PT ICD-10 Diagnosis Description 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ed L97.512 Non-pressure chronic ulcer of other part of right  foot with fat layer exposed 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.0 Lymphedema, not elsewhere classified Quantity: 1 : [id number] 11042 - WC PHYS SUBQ TISS 20 SQ CM ICD-10 Diagnosis Description 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ed L97.512 Non-pressure chronic ulcer of other part of right foot with fat layer exposed Quantity: 1 Electronic Signature(s) Signed: 05/27/2023 12:08:36 PM By: Duanne Guess MD FACS Entered By: Duanne Guess on 05/27/2023 09:08:36

## 2023-05-27 NOTE — Addendum Note (Signed)
Addended by: Lajoyce Lauber on: 05/27/2023 03:38 PM   Modules accepted: Orders

## 2023-05-27 NOTE — Progress Notes (Addendum)
CALUM, CORMIER ([id number]) [id number] Page 1 of 12 Visit Report for 05/27/2023 Arrival Information Details Patient Name: Date of Service: CEEJAY, [name] 05/27/2023 11:00 A M Medical Record Number: [id number] Patient Account Number: [id number] Date of Birth/Sex: Treating RN: October 20, 1950 (72 y.o. M) Primary Care Vaun Hyndman: [name] Other Clinician: Referring Aubriegh Minch: Treating Kasey Hansell/Extender: [name] in Treatment: 70 Visit Information History Since Last Visit Added or deleted any medications: No Patient Arrived: Wheel Chair Any new allergies or adverse reactions: No Arrival Time: 11:00 Had a fall or experienced change in No Accompanied By: self activities of daily living that may affect Transfer Assistance: None risk of falls: Patient Identification Verified: Yes Signs or symptoms of abuse/neglect since last visito No Secondary Verification Process Completed: Yes Hospitalized since last visit: No Patient Requires Transmission-Based Precautions: No Implantable device outside of the clinic excluding No Patient Has Alerts: Yes cellular tissue based products placed in the center Patient Alerts: R ABI= N/C, TBI= .75 since last visit: L ABI =N/C, TBI = .57 Pain Present Now: No Electronic Signature(s) Signed: 05/27/2023 11:07:24 AM By: Dayton Scrape Entered By: Dayton Scrape on 05/27/2023 08:00:32 -------------------------------------------------------------------------------- Compression Therapy Details Patient Name: Date of Service: JANDIEL, MAGALLANES [name] 05/27/2023 11:00 A M Medical Record Number: [id number] Patient Account Number: [id number] Date of Birth/Sex: Treating RN: April 19, 1951 (72 y.o. [name] Primary Care Coley Kulikowski: [name] Other Clinician: Referring Chasin Findling: Treating Xzaviar Maloof/Extender: [name] in Treatment: 76 Compression Therapy Performed for Wound Assessment: Wound #3  Right,Medial Lower Leg Performed By: Clinician [name], RN Compression Type: Three Layer Post Procedure Diagnosis Same as Pre-procedure Electronic Signature(s) Signed: 05/27/2023 1:05:52 PM By: Zenaida Deed RN, BSN Entered By: Zenaida Deed on 05/27/2023 08:39:34 Encounter Discharge Information Details -------------------------------------------------------------------------------- [name] ([id number]) [id number] Page 2 of 12 Patient Name: Date of Service: [name], [name] 05/27/2023 11:00 A M Medical Record Number: [id number] Patient Account Number: [id number] Date of Birth/Sex: Treating RN: Dec 11, 1950 (72 y.o. [name] Primary Care Satya Bohall: [name] Other Clinician: Referring Venita Seng: Treating Uziah Sorter/Extender: [name] in Treatment: 76 Encounter Discharge Information Items Post Procedure Vitals Discharge Condition: Stable Temperature (F): 97.8 Ambulatory Status: Wheelchair Pulse (bpm): 49 Discharge Destination: Home Respiratory Rate (breaths/min): 18 Transportation: Private Auto Blood Pressure (mmHg): 149/71 Accompanied By: self Schedule Follow-up Appointment: Yes Clinical Summary of Care: Patient Declined Electronic Signature(s) Signed: 05/27/2023 11:58:07 AM By: [name] Entered By: [name] on 05/27/2023 08:57:57 -------------------------------------------------------------------------------- Lower Extremity Assessment Details Patient Name: Date of Service: [name], [name] 05/27/2023 11:00 A M Medical Record Number: [id number] Patient Account Number: [id number] Date of Birth/Sex: Treating RN: [birth year] (72 y.o. [name] Primary Care Josilynn Losh: [name] Other Clinician: Referring Alorah Mcree: Treating Zanden Colver/Extender: [name] in Treatment: 67 Edema Assessment Assessed: Kyra Searles: No] [Right: No] Edema: [Left: Yes]  [Right: Yes] Calf Left: Right: Point of Measurement: From Medial Instep 40.5 cm 45.5 cm Ankle Left: Right: Point of Measurement: From Medial Instep 25.5 cm 27 cm Vascular Assessment Pulses: Dorsalis Pedis Palpable: [Right:Yes] Extremity colors, hair growth, and conditions: Extremity Color: [Left:Hyperpigmented] [Right:Hyperpigmented] Hair Growth on Extremity: [Left:Yes] [Right:Yes] Temperature of Extremity: [Left:Warm] [Right:Warm] Capillary Refill: [Left:< 3 seconds] [Right:< 3 seconds] Dependent Rubor: [Left:No No] [Right:No No] Electronic Signature(s) Signed: 05/27/2023 12:02:50 PM By: Karie Schwalbe RN Entered By: Karie Schwalbe on 05/27/2023 08:19:29 [name] ([id number]) [id number] Page 3 of 12 -------------------------------------------------------------------------------- Multi Wound Chart Details Patient Name: Date of Service: [name], [name] 05/27/2023 11:00 A M  Medical Record Number: [id number] Patient Account Number: [id number] Date of Birth/Sex: Treating RN: 1951/07/23 (72 y.o. M) Primary Care Buford Bremer: [name] Other Clinician: Referring Mckell Riecke: Treating Yousef Huge/Extender: [name] in Treatment: 44 Vital Signs Height(in): 71 Pulse(bpm): 49 Weight(lbs): 267 Blood Pressure(mmHg): 149/71 Body Mass Index(BMI): 37.2 Temperature(F): 97.8 Respiratory Rate(breaths/min): 18 [11:Photos:] Right, Dorsal Foot Right, Medial Lower Leg Right, Anterior Lower Leg Wound Location: Gradually Appeared Gradually Appeared Gradually Appeared Wounding Event: Lymphedema Lymphedema Calcinosis Primary Etiology: Cataracts, Anemia, Lymphedema, Cataracts, Anemia, Lymphedema, Cataracts, Anemia, Lymphedema, Comorbid History: Congestive Heart Failure, Deep Vein Congestive Heart Failure, Deep Vein Congestive Heart Failure, Deep Vein Thrombosis, Hypertension, Peripheral Thrombosis, Hypertension, Peripheral Thrombosis,  Hypertension, Peripheral Venous Disease Venous Disease Venous Disease 05/27/2023 12/07/2021 05/12/2022 Date Acquired: 0 67 54 Weeks of Treatment: Open Open Open Wound Status: No No No Wound Recurrence: No No Yes Clustered Wound: N/A N/A 2 Clustered Quantity: 0.5x1x0.1 7.5x5.5x0.3 0.4x0.4x0.2 Measurements L x W x D (cm) 0.393 32.398 0.126 A (cm) : rea 0.039 9.719 0.025 Volume (cm) : N/A -506.60% 46.60% % Reduction in A rea: N/A -355.00% -4.20% % Reduction in Volume: Full Thickness Without Exposed Full Thickness With Exposed Support Full Thickness Without Exposed Classification: Support Structures Structures Support Structures Medium Medium Small Exudate A mount: Serosanguineous Serosanguineous Serous Exudate Type: red, brown red, brown amber Exudate Color: Flat and Intact Flat and Intact Epibole Wound Margin: Large (67-100%) Small (1-33%) Small (1-33%) Granulation A mount: Red Red Pink Granulation Quality: None Present (0%) Large (67-100%) Large (67-100%) Necrotic A mount: N/A Eschar, Adherent Slough Eschar Necrotic Tissue: Fat Layer (Subcutaneous Tissue): Yes Fat Layer (Subcutaneous Tissue): Yes Fat Layer (Subcutaneous Tissue): Yes Exposed Structures: Fascia: No Fascia: No Fascia: No Tendon: No Tendon: No Tendon: No Muscle: No Muscle: No Muscle: No Joint: No Joint: No Joint: No Bone: No Bone: No Bone: No Small (1-33%) Medium (34-66%) Large (67-100%) Epithelialization: Debridement - Excisional Debridement - Excisional N/A Debridement: Pre-procedure Verification/Time Out 11:35 11:35 N/A Taken: Lidocaine 4% Topical Solution Lidocaine 4% Topical Solution N/A Pain Control: Subcutaneous Necrotic/Eschar, Subcutaneous, N/A Tissue Debrided: Slough Skin/Subcutaneous Tissue Skin/Subcutaneous Tissue N/A Level: 0.39 6.48 N/A Debridement A (sq cm): rea Curette Curette N/A Instrument: Minimum Minimum N/A Bleeding: Pressure Pressure N/A Hemostasis  Achieved: 3 3 N/A Procedural PainONIE, HAYASHI ([id number]) [id number] Page 4 of 12 1 1  N/A Post Procedural Pain: Procedure was tolerated well Procedure was tolerated well N/A Debridement Treatment Response: 0.5x1x0.1 7.5x5.5x0.3 N/A Post Debridement Measurements L x W x D (cm) 0.039 9.719 N/A Post Debridement Volume: (cm) Scarring: Yes Excoriation: No No Abnormalities Noted Periwound Skin Texture: No Abnormalities Noted Maceration: Yes No Abnormalities Noted Periwound Skin Moisture: Hemosiderin Staining: Yes Hemosiderin Staining: Yes Hemosiderin Staining: Yes Periwound Skin Color: Rubor: Yes Erythema: No No Abnormality No Abnormality No Abnormality Temperature: N/A Yes N/A Tenderness on Palpation: Debridement Compression Therapy N/A Procedures Performed: Debridement Treatment Notes Wound #11 (Foot) Wound Laterality: Dorsal, Right Cleanser Peri-Wound Care Topical Primary Dressing Maxorb Extra Ag+ Alginate Dressing, 2x2 (in/in) Discharge Instruction: Apply to wound bed as instructed Secondary Dressing Woven Gauze Sponge, Non-Sterile 4x4 in Discharge Instruction: Apply over primary dressing as directed. Secured With Compression Wrap Compression Stockings Add-Ons Wound #3 (Lower Leg) Wound Laterality: Right, Medial Cleanser Soap and Water Discharge Instruction: May shower and wash wound with dial antibacterial soap and water prior to dressing change. Wound Cleanser Discharge Instruction: Cleanse the wound with wound cleanser prior to applying a clean dressing using gauze sponges, not tissue or cotton balls. Peri-Wound  Care Skin Prep Discharge Instruction: cavelon to macerated wound margins Sween Lotion (Moisturizing lotion) Discharge Instruction: Apply moisturizing lotion as directed Topical Primary Dressing Maxorb Extra Ag+ Alginate Dressing, 4x4.75 (in/in) Discharge Instruction: Apply to excoriated weeping areas Secondary  Dressing ABD Pad, 8x10 Discharge Instruction: Apply over primary dressing as directed. Secured With Compression Wrap ThreePress (3 layer compression wrap) Discharge Instruction: Apply three layer compression as directed. Unnaboot w/Calamine, 4x10 (in/yd) Discharge Instruction: Apply Unnaboot on distal foot and top of lower leg Compression Stockings Add-Ons DANH, BAYUS ([id number]) [id number] Page 5 of 12 Wound #6 (Lower Leg) Wound Laterality: Right, Anterior Cleanser Soap and Water Discharge Instruction: May shower and wash wound with dial antibacterial soap and water prior to dressing change. Wound Cleanser Discharge Instruction: Cleanse the wound with wound cleanser prior to applying a clean dressing using gauze sponges, not tissue or cotton balls. Peri-Wound Care Sween Lotion (Moisturizing lotion) Discharge Instruction: Apply moisturizing lotion as directed Topical Primary Dressing Maxorb Extra Ag+ Alginate Dressing, 2x2 (in/in) Discharge Instruction: Apply to wound bed as instructed Secondary Dressing ABD Pad, 8x10 Discharge Instruction: Apply over primary dressing as directed. Secured With Compression Wrap ThreePress (3 layer compression wrap) Discharge Instruction: Apply three layer compression as directed. Compression Stockings Add-Ons Electronic Signature(s) Signed: 05/27/2023 12:03:09 PM By: Duanne Guess MD FACS Entered By: Duanne Guess on 05/27/2023 09:03:09 -------------------------------------------------------------------------------- Multi-Disciplinary Care Plan Details Patient Name: Date of Service: [name], [name] 05/27/2023 11:00 A M Medical Record Number: [id number] Patient Account Number: [id number] Date of Birth/Sex: Treating RN: 05-Sep-1950 (72 y.o. [name] Primary Care Lashawne Dura: [name] Other Clinician: Referring Nimisha Rathel: Treating Steffanie Mingle/Extender: [name] in  Treatment: 50 Multidisciplinary Care Plan reviewed with physician Active Inactive Venous Leg Ulcer Nursing Diagnoses: Knowledge deficit related to disease process and management Potential for venous Insuffiency (use before diagnosis confirmed) Goals: Patient will maintain optimal edema control Date Initiated: 02/17/2022 Target Resolution Date: 06/24/2023 Goal Status: Active Interventions: Assess peripheral edema status every visit. Compression as ordered Provide education on venous insufficiency [name], [name] ([id number]) [id number] Page 6 of 12 Treatment Activities: Non-invasive vascular studies : 02/17/2022 Therapeutic compression applied : 02/17/2022 Notes: Wound/Skin Impairment Nursing Diagnoses: Impaired tissue integrity Knowledge deficit related to ulceration/compromised skin integrity Goals: Patient/caregiver will verbalize understanding of skin care regimen Date Initiated: 02/17/2022 Target Resolution Date: 06/24/2023 Goal Status: Active Ulcer/skin breakdown will have a volume reduction of 30% by week 4 Date Initiated: 02/17/2022 Date Inactivated: 03/10/2022 Target Resolution Date: 03/10/2022 Unmet Reason: infection being treated, Goal Status: Unmet waiting on derm appte Ulcer/skin breakdown will have a volume reduction of 50% by week 8 Date Initiated: 03/10/2022 Date Inactivated: 04/14/2022 Target Resolution Date: 04/07/2022 Goal Status: Unmet Unmet Reason: calcinosis Ulcer/skin breakdown will have a volume reduction of 80% by week 12 Date Initiated: 04/14/2022 Date Inactivated: 05/05/2022 Target Resolution Date: 05/05/2022 Unmet Reason: infection, chronic Goal Status: Unmet condition Interventions: Assess patient/caregiver ability to obtain necessary supplies Assess patient/caregiver ability to perform ulcer/skin care regimen upon admission and as needed Assess ulceration(s) every visit Provide education on ulcer and skin care Treatment  Activities: Skin care regimen initiated : 02/17/2022 Topical wound management initiated : 02/17/2022 Notes: Electronic Signature(s) Signed: 05/27/2023 1:05:52 PM By: Zenaida Deed RN, BSN Entered By: Zenaida Deed on 05/27/2023 08:38:27 -------------------------------------------------------------------------------- Pain Assessment Details Patient Name: Date of Service: [name] 05/27/2023 11:00 A M Medical Record Number: [id number] Patient Account Number: [id number] Date of Birth/Sex: Treating RN: [birth year] (72 y.o. M) Primary Care Ronnesha Mester: [name] Other Clinician:  Referring Decarlos Empey: Treating Dyllin Gulley/Extender: [name] in Treatment: 67 Active Problems Location of Pain Severity and Description of Pain Patient Has Paino No Site Locations [name], [place] ([id number]) [id number] Page 7 of 12 Pain Management and Medication Current Pain Management: Electronic Signature(s) Signed: 05/27/2023 11:07:24 AM By: Dayton Scrape Entered By: Dayton Scrape on 05/27/2023 08:00:58 -------------------------------------------------------------------------------- Patient/Caregiver Education Details Patient Name: Date of Service: Mcandrew, [name] 11/1/2024andnbsp11:00 A M Medical Record Number: [id number] Patient Account Number: [id number] Date of Birth/Gender: Treating RN: 12/14/1950 (72 y.o. [name] Primary Care Physician: [name] Other Clinician: Referring Physician: Treating Physician/Extender: [name] in Treatment: 26 Education Assessment Education Provided To: Patient Education Topics Provided Infection: Methods: Explain/Verbal Responses: Reinforcements needed, State content correctly Venous: Methods: Explain/Verbal Responses: Reinforcements needed, State content correctly Wound/Skin Impairment: Methods: Explain/Verbal Responses: Reinforcements needed, State content  correctly Electronic Signature(s) Signed: 05/27/2023 1:05:52 PM By: Zenaida Deed RN, BSN Entered By: Zenaida Deed on 05/27/2023 08:39:00 [name] ([id number]) [id number] Page 8 of 12 -------------------------------------------------------------------------------- Wound Assessment Details Patient Name: Date of Service: [name], [name] 05/27/2023 11:00 A M Medical Record Number: [id number] Patient Account Number: [id number] Date of Birth/Sex: Treating RN: January 07, 1951 (72 y.o. [name] Primary Care Kailand Seda: [name] Other Clinician: Referring Makynleigh Breslin: Treating Jabria Loos/Extender: [name] in Treatment: 67 Wound Status Wound Number: 11 Primary Lymphedema Etiology: Wound Location: Right, Dorsal Foot Wound Open Wounding Event: Gradually Appeared Status: Date Acquired: 05/27/2023 Comorbid Cataracts, Anemia, Lymphedema, Congestive Heart Failure, Deep Weeks Of Treatment: 0 History: Vein Thrombosis, Hypertension, Peripheral Venous Disease Clustered Wound: No Photos Wound Measurements Length: (cm) 0.5 Width: (cm) 1 Depth: (cm) 0.1 Area: (cm) 0.393 Volume: (cm) 0.039 % Reduction in Area: % Reduction in Volume: Epithelialization: Small (1-33%) Tunneling: No Undermining: No Wound Description Classification: Full Thickness Without Exposed Suppor Wound Margin: Flat and Intact Exudate Amount: Medium Exudate Type: Serosanguineous Exudate Color: red, brown t Structures Foul Odor After Cleansing: No Slough/Fibrino No Wound Bed Granulation Amount: Large (67-100%) Exposed Structure Granulation Quality: Red Fascia Exposed: No Necrotic Amount: None Present (0%) Fat Layer (Subcutaneous Tissue) Exposed: Yes Tendon Exposed: No Muscle Exposed: No Joint Exposed: No Bone Exposed: No Periwound Skin Texture Texture Color No Abnormalities Noted: No No Abnormalities Noted: No Scarring: Yes Hemosiderin Staining:  Yes Moisture Temperature / Pain No Abnormalities Noted: Yes Temperature: No Abnormality Treatment Notes Wound #11 (Foot) Wound Laterality: Dorsal, Right JASMINE, MACEACHERN ([id number]) [id number] Page 9 of 12 Peri-Wound Care Topical Primary Dressing Maxorb Extra Ag+ Alginate Dressing, 2x2 (in/in) Discharge Instruction: Apply to wound bed as instructed Secondary Dressing Woven Gauze Sponge, Non-Sterile 4x4 in Discharge Instruction: Apply over primary dressing as directed. Secured With Compression Wrap Compression Stockings Facilities manager) Signed: 05/27/2023 12:02:50 PM By: Karie Schwalbe RN Entered By: Karie Schwalbe on 05/27/2023 08:26:14 -------------------------------------------------------------------------------- Wound Assessment Details Patient Name: Date of Service: [name], [name] 05/27/2023 11:00 A M Medical Record Number: [id number] Patient Account Number: [id number] Date of Birth/Sex: Treating RN: [birth year] (72 y.o. M) Primary Care Kaisa Wofford: [name] Other Clinician: Referring Chabely Norby: Treating Ashlay Altieri/Extender: [name] in Treatment: 67 Wound Status Wound Number: 3 Primary Lymphedema Etiology: Wound Location: Right, Medial Lower Leg Wound Open Wounding Event: Gradually Appeared Status: Date Acquired: 12/07/2021 Comorbid Cataracts, Anemia, Lymphedema, Congestive Heart Failure, Deep Weeks Of Treatment: 67 History: Vein Thrombosis, Hypertension, Peripheral Venous Disease Clustered Wound: No Photos Wound Measurements Length: (cm) 7.5 Width: (cm) 5.5 Depth: (cm) 0.3 Area: (cm) 32.398 Volume: (cm)  9.719 % Reduction in Area: -506.6% % Reduction in Volume: -355% Epithelialization: Medium (34-66%) Tunneling: No Undermining: No Wound Description Classification: Full Thickness With Exposed Support Structures Wound Margin: Flat and Intact Exudate Amount: Medium Exudate Type:  Serosanguineous Exudate Color: red, brown Diesing, Ivy ([id number]) Foul Odor After Cleansing: No Slough/Fibrino Yes [id number] Page 10 of 12 Wound Bed Granulation Amount: Small (1-33%) Exposed Structure Granulation Quality: Red Fascia Exposed: No Necrotic Amount: Large (67-100%) Fat Layer (Subcutaneous Tissue) Exposed: Yes Necrotic Quality: Eschar, Adherent Slough Tendon Exposed: No Muscle Exposed: No Joint Exposed: No Bone Exposed: No Periwound Skin Texture Texture Color No Abnormalities Noted: Yes No Abnormalities Noted: No Erythema: No Moisture Hemosiderin Staining: Yes No Abnormalities Noted: Yes Rubor: Yes Temperature / Pain Temperature: No Abnormality Tenderness on Palpation: Yes Treatment Notes Wound #3 (Lower Leg) Wound Laterality: Right, Medial Cleanser Soap and Water Discharge Instruction: May shower and wash wound with dial antibacterial soap and water prior to dressing change. Wound Cleanser Discharge Instruction: Cleanse the wound with wound cleanser prior to applying a clean dressing using gauze sponges, not tissue or cotton balls. Peri-Wound Care Skin Prep Discharge Instruction: cavelon to macerated wound margins Sween Lotion (Moisturizing lotion) Discharge Instruction: Apply moisturizing lotion as directed Topical Primary Dressing Maxorb Extra Ag+ Alginate Dressing, 4x4.75 (in/in) Discharge Instruction: Apply to excoriated weeping areas Secondary Dressing ABD Pad, 8x10 Discharge Instruction: Apply over primary dressing as directed. Secured With Compression Wrap ThreePress (3 layer compression wrap) Discharge Instruction: Apply three layer compression as directed. Unnaboot w/Calamine, 4x10 (in/yd) Discharge Instruction: Apply Unnaboot on distal foot and top of lower leg Compression Stockings Add-Ons Electronic Signature(s) Signed: 05/27/2023 12:02:50 PM By: Karie Schwalbe RN Previous Signature: 05/27/2023 11:07:24 AM  Version By: Dayton Scrape Entered By: Karie Schwalbe on 05/27/2023 08:27:02 -------------------------------------------------------------------------------- Wound Assessment Details Patient Name: Date of Service: CAYTON, CUEVAS 05/27/2023 11:00 A Barnie Del ([id number]) [id number] Page 11 of 12 Medical Record Number: [id number] Patient Account Number: [id number] Date of Birth/Sex: Treating RN: 06-05-1951 (72 y.o. M) Primary Care Lisel Siegrist: [name] Other Clinician: Referring Mallory Enriques: Treating Cariah Salatino/Extender: [name] in Treatment: 67 Wound Status Wound Number: 6 Primary Calcinosis Etiology: Wound Location: Right, Anterior Lower Leg Wound Open Wounding Event: Gradually Appeared Status: Date Acquired: 05/12/2022 Comorbid Cataracts, Anemia, Lymphedema, Congestive Heart Failure, Deep Weeks Of Treatment: 54 History: Vein Thrombosis, Hypertension, Peripheral Venous Disease Clustered Wound: Yes Photos Wound Measurements Length: (cm) Width: (cm) Depth: (cm) Clustered Quantity: Area: (cm) Volume: (cm) 0.4 % Reduction in Area: 46.6% 0.4 % Reduction in Volume: -4.2% 0.2 Epithelialization: Large (67-100%) 2 Tunneling: No 0.126 Undermining: No 0.025 Wound Description Classification: Full Thickness Without Exposed Sup Wound Margin: Epibole Exudate Amount: Small Exudate Type: Serous Exudate Color: amber port Structures Foul Odor After Cleansing: No Slough/Fibrino No Wound Bed Granulation Amount: Small (1-33%) Exposed Structure Granulation Quality: Pink Fascia Exposed: No Necrotic Amount: Large (67-100%) Fat Layer (Subcutaneous Tissue) Exposed: Yes Necrotic Quality: Eschar Tendon Exposed: No Muscle Exposed: No Joint Exposed: No Bone Exposed: No Periwound Skin Texture Texture Color No Abnormalities Noted: Yes No Abnormalities Noted: No Hemosiderin Staining: Yes Moisture No Abnormalities Noted: Yes  Temperature / Pain Temperature: No Abnormality Treatment Notes Wound #6 (Lower Leg) Wound Laterality: Right, Anterior Cleanser Soap and Water Discharge Instruction: May shower and wash wound with dial antibacterial soap and water prior to dressing change. Wound Cleanser Discharge Instruction: Cleanse the wound with wound cleanser prior to applying a clean dressing using gauze sponges, not tissue or cotton balls. Peri-Wound  Care Sween Lotion (Moisturizing lotion) Discharge Instruction: Apply moisturizing lotion as directed SYLVESTRE, RATHGEBER ([id number]) [id number] Page 12 of 12 Topical Primary Dressing Maxorb Extra Ag+ Alginate Dressing, 2x2 (in/in) Discharge Instruction: Apply to wound bed as instructed Secondary Dressing ABD Pad, 8x10 Discharge Instruction: Apply over primary dressing as directed. Secured With Compression Wrap ThreePress (3 layer compression wrap) Discharge Instruction: Apply three layer compression as directed. Compression Stockings Add-Ons Electronic Signature(s) Signed: 05/27/2023 12:02:50 PM By: Karie Schwalbe RN Previous Signature: 05/27/2023 11:07:24 AM Version By: Dayton Scrape Entered By: Karie Schwalbe on 05/27/2023 08:27:39 -------------------------------------------------------------------------------- Vitals Details Patient Name: Date of Service: [name] 05/27/2023 11:00 A M Medical Record Number: [id number] Patient Account Number: [id number] Date of Birth/Sex: Treating RN: 03-17-1951 (72 y.o. M) Primary Care Adlean Hardeman: [name] Other Clinician: Referring Dallen Bunte: Treating Zahira Brummond/Extender: [name] in Treatment: 44 Vital Signs Time Taken: 11:00 Temperature (F): 97.8 Height (in): 71 Pulse (bpm): 49 Weight (lbs): 267 Respiratory Rate (breaths/min): 18 Body Mass Index (BMI): 37.2 Blood Pressure (mmHg): 149/71 Reference Range: 80 - 120 mg / dl Electronic Signature(s) Signed:  05/27/2023 11:07:24 AM By: Dayton Scrape Entered By: Dayton Scrape on 05/27/2023 08:00:50

## 2023-05-30 ENCOUNTER — Other Ambulatory Visit: Payer: Self-pay | Admitting: *Deleted

## 2023-05-30 ENCOUNTER — Ambulatory Visit: Payer: Medicare Other | Attending: Internal Medicine

## 2023-05-30 ENCOUNTER — Ambulatory Visit: Payer: Medicare Other

## 2023-05-30 ENCOUNTER — Other Ambulatory Visit: Payer: Self-pay | Admitting: Cardiovascular Disease

## 2023-05-30 DIAGNOSIS — Z136 Encounter for screening for cardiovascular disorders: Secondary | ICD-10-CM | POA: Diagnosis not present

## 2023-05-30 DIAGNOSIS — I2510 Atherosclerotic heart disease of native coronary artery without angina pectoris: Secondary | ICD-10-CM

## 2023-05-30 DIAGNOSIS — E782 Mixed hyperlipidemia: Secondary | ICD-10-CM | POA: Diagnosis not present

## 2023-05-30 DIAGNOSIS — Z09 Encounter for follow-up examination after completed treatment for conditions other than malignant neoplasm: Secondary | ICD-10-CM | POA: Insufficient documentation

## 2023-05-30 DIAGNOSIS — Z0000 Encounter for general adult medical examination without abnormal findings: Secondary | ICD-10-CM | POA: Insufficient documentation

## 2023-05-30 DIAGNOSIS — E785 Hyperlipidemia, unspecified: Secondary | ICD-10-CM | POA: Diagnosis not present

## 2023-05-30 DIAGNOSIS — R0602 Shortness of breath: Secondary | ICD-10-CM | POA: Insufficient documentation

## 2023-05-30 DIAGNOSIS — I10 Essential (primary) hypertension: Secondary | ICD-10-CM | POA: Diagnosis not present

## 2023-05-30 DIAGNOSIS — D696 Thrombocytopenia, unspecified: Secondary | ICD-10-CM | POA: Diagnosis not present

## 2023-05-30 DIAGNOSIS — Z79899 Other long term (current) drug therapy: Secondary | ICD-10-CM | POA: Insufficient documentation

## 2023-05-30 LAB — ECHOCARDIOGRAM COMPLETE
Area-P 1/2: 3.42 cm2
S' Lateral: 3.6 cm

## 2023-05-31 LAB — CBC WITH DIFFERENTIAL/PLATELET
Basophils Absolute: 0 10*3/uL (ref 0.0–0.2)
Basos: 0 %
EOS (ABSOLUTE): 0.3 10*3/uL (ref 0.0–0.4)
Eos: 4 %
Hematocrit: 39.2 % (ref 37.5–51.0)
Hemoglobin: 13.2 g/dL (ref 13.0–17.7)
Immature Grans (Abs): 0 10*3/uL (ref 0.0–0.1)
Immature Granulocytes: 0 %
Lymphocytes Absolute: 0.9 10*3/uL (ref 0.7–3.1)
Lymphs: 15 %
MCH: 34.4 pg — ABNORMAL HIGH (ref 26.6–33.0)
MCHC: 33.7 g/dL (ref 31.5–35.7)
MCV: 102 fL — ABNORMAL HIGH (ref 79–97)
Monocytes Absolute: 0.6 10*3/uL (ref 0.1–0.9)
Monocytes: 10 %
Neutrophils Absolute: 4.4 10*3/uL (ref 1.4–7.0)
Neutrophils: 71 %
Platelets: 85 10*3/uL — CL (ref 150–450)
RBC: 3.84 x10E6/uL — ABNORMAL LOW (ref 4.14–5.80)
RDW: 12.7 % (ref 11.6–15.4)
WBC: 6.3 10*3/uL (ref 3.4–10.8)

## 2023-05-31 LAB — BASIC METABOLIC PANEL
BUN/Creatinine Ratio: 19 (ref 10–24)
BUN: 17 mg/dL (ref 8–27)
CO2: 25 mmol/L (ref 20–29)
Calcium: 9.2 mg/dL (ref 8.6–10.2)
Chloride: 103 mmol/L (ref 96–106)
Creatinine, Ser: 0.91 mg/dL (ref 0.76–1.27)
Glucose: 81 mg/dL (ref 70–99)
Potassium: 4.5 mmol/L (ref 3.5–5.2)
Sodium: 144 mmol/L (ref 134–144)
eGFR: 88 mL/min/{1.73_m2} (ref >59)

## 2023-05-31 LAB — PRO B NATRIURETIC PEPTIDE: NT-Pro BNP: 832 pg/mL — ABNORMAL HIGH (ref 0–376)

## 2023-06-03 ENCOUNTER — Encounter (HOSPITAL_BASED_OUTPATIENT_CLINIC_OR_DEPARTMENT_OTHER): Payer: Medicare Other | Admitting: General Surgery

## 2023-06-03 DIAGNOSIS — L97512 Non-pressure chronic ulcer of other part of right foot with fat layer exposed: Secondary | ICD-10-CM | POA: Diagnosis not present

## 2023-06-03 DIAGNOSIS — I110 Hypertensive heart disease with heart failure: Secondary | ICD-10-CM | POA: Diagnosis not present

## 2023-06-03 DIAGNOSIS — L97822 Non-pressure chronic ulcer of other part of left lower leg with fat layer exposed: Secondary | ICD-10-CM | POA: Diagnosis not present

## 2023-06-03 DIAGNOSIS — I509 Heart failure, unspecified: Secondary | ICD-10-CM | POA: Diagnosis not present

## 2023-06-03 DIAGNOSIS — I890 Lymphedema, not elsewhere classified: Secondary | ICD-10-CM | POA: Diagnosis not present

## 2023-06-03 DIAGNOSIS — L942 Calcinosis cutis: Secondary | ICD-10-CM | POA: Diagnosis not present

## 2023-06-03 DIAGNOSIS — L97812 Non-pressure chronic ulcer of other part of right lower leg with fat layer exposed: Secondary | ICD-10-CM | POA: Diagnosis not present

## 2023-06-03 NOTE — Progress Notes (Signed)
[name] ([id number]) [id number] Page 1 of 17 Visit Report for 06/03/2023 Chief Complaint Document Details Patient Name: Date of Service: [name], [name] 06/03/2023 11:00 A M Medical Record Number: [id number] Patient Account Number: [id number] Date of Birth/Sex: Treating RN: [birth year] (72 y.o. M) Primary Care Provider: Nicoletta [name] Other Clinician: Referring Provider: Treating Provider/Extender: [name] in Treatment: 68 Information Obtained from: Patient Chief Complaint Patient seen for complaints of Non-Healing Wounds. Electronic Signature(s) Signed: 06/03/2023 11:59:20 AM By: [name] Guess MD FACS Entered By: [name] on 06/03/2023 11:59:20 -------------------------------------------------------------------------------- Debridement Details Patient Name: Date of Service: [name] 06/03/2023 11:00 A M Medical Record Number: [id number] Patient Account Number: [id number] Date of Birth/Sex: Treating RN: [birth year] (72 y.o. [name] Primary Care Provider: Nicoletta [name] Other Clinician: Referring Provider: Treating Provider/Extender: [name] in Treatment: 68 Debridement Performed for Assessment: Wound #3 Right,Medial Lower Leg Performed By: Physician [name] Guess, MD The following information was scribed by: [name] The information was scribed for: [name] Debridement Type: Debridement Level of Consciousness (Pre-procedure): Awake and Alert Pre-procedure Verification/Time Out Yes - 11:50 Taken: Start Time: 11:50 Pain Control: Lidocaine 4% T opical Solution Percent of Wound Bed Debrided: 20% T Area Debrided (cm): otal 5.89 Tissue and other material debrided: Viable, Non-Viable, Eschar, Slough, Subcutaneous, Slough Level: Skin/Subcutaneous Tissue Debridement Description: Excisional Instrument: Curette Bleeding: Minimum Hemostasis Achieved:  Pressure Procedural Pain: 0 Post Procedural Pain: 0 Response to Treatment: Procedure was tolerated well Level of Consciousness (Post- Awake and Alert procedure): Post Debridement Measurements of Total Wound Length: (cm) 7.5 Width: (cm) 5 Depth: (cm) 0.3 Volume: (cm) 8.836 Character of Wound/Ulcer Post Debridement: Improved [name] ([id number]) [id number] Page 2 of 17 Post Procedure Diagnosis Same as Pre-procedure Electronic Signature(s) Signed: 06/03/2023 12:31:22 PM By: [name] Guess MD FACS Signed: 06/03/2023 1:08:45 PM By: [name] Deed RN, BSN Entered By: [name] on 06/03/2023 11:53:34 -------------------------------------------------------------------------------- Debridement Details Patient Name: Date of Service: [name], [name] 06/03/2023 11:00 A M Medical Record Number: [id number] Patient Account Number: [id number] Date of Birth/Sex: Treating RN: 27-Jan-1951 (72 y.o. [name] Primary Care Provider: Nicoletta [name] Other Clinician: Referring Provider: Treating Provider/Extender: [name] in Treatment: 68 Debridement Performed for Assessment: Wound #6 Right,Anterior Lower Leg Performed By: Physician [name] Guess, MD The following information was scribed by: [name] The information was scribed for: [name] Debridement Type: Debridement Level of Consciousness (Pre-procedure): Awake and Alert Pre-procedure Verification/Time Out Yes - 11:50 Taken: Start Time: 11:50 Pain Control: Lidocaine 4% T opical Solution Percent of Wound Bed Debrided: 100% T Area Debrided (cm): otal 0.07 Tissue and other material debrided: Viable, Non-Viable, Slough, Subcutaneous, Slough Level: Skin/Subcutaneous Tissue Debridement Description: Excisional Instrument: Curette Bleeding: Minimum Hemostasis Achieved: Pressure Procedural Pain: 0 Post Procedural Pain: 0 Response to Treatment:  Procedure was tolerated well Level of Consciousness (Post- Awake and Alert procedure): Post Debridement Measurements of Total Wound Length: (cm) 0.3 Width: (cm) 0.3 Depth: (cm) 0.2 Volume: (cm) 0.014 Character of Wound/Ulcer Post Debridement: Improved Post Procedure Diagnosis Same as Pre-procedure Electronic Signature(s) Signed: 06/03/2023 12:31:22 PM By: [name] Guess MD FACS Signed: 06/03/2023 1:08:45 PM By: [name] Deed RN, BSN Entered By: [name] on 06/03/2023 11:54:47 HPI Details -------------------------------------------------------------------------------- [name] ([id number]) [id number] Page 3 of 17 Patient Name: Date of Service: [name] 06/03/2023 11:00 A M Medical Record Number: [id number] Patient Account Number: [id number] Date of Birth/Sex: Treating RN: 1950-09-16 (72 y.o. M) Primary Care Provider: Nicoletta [name] Other  Clinician: Referring Provider: Treating Provider/Extender: [name] in Treatment: 79 History of Present Illness HPI Description: ADMISSION 02/09/2022 This is a 72 year old man who recently moved to [place] from [place]. He has a history of of coronary artery disease and prior left popliteal vein DVT . He is not diabetic and does not smoke. 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 He has had multiple spinal surgeries and has limited mobility. He primarily uses a motorized wheelchair. He has had lymphedema for many years and does have lymphedema pumps although he says he does not use them frequently. He has wounds on his bilateral lower extremities. He was receiving care in [place] for these prior to his move. They were apparently packing his wounds with Betadine soaked gauze. He has worn compression wraps in the past but not recently. He did say that they helped tremendously. In clinic today, his right ABI is noncompressible but has good signals; the left  ABI was 1.17. No formal vascular studies have been performed. On his left dorsal foot, there is a circular lesion that exposes the fat layer. There is fairly heavy slough accumulation within the wound, but no significant odor. On his right medial calf, he has multiple pinholes that have slough buildup in them and probe for about 2 to 4 mm in depth. They are draining clear fluid. On his right lateral midfoot, he has ulceration consistent with venous stasis. It is geographic and has slough accumulation. 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 02/17/2022: All of the wounds are little bit larger today and have more slough accumulation. Today, the medial calf openings are larger and probe to something that feels calcified. There is odor coming from his wounds. His vascular studies are scheduled for August 9 and 10. 02-24-2022 upon evaluation today patient presents for follow-up concerning his ongoing issues here with his lower extremities. With that being said he does have significant problems with what appears to be cellulitis unfortunately the PCR culture that was sent last week got crushed by UPS in transit. With that being said we are going to reobtain a culture today although I am actually to do it on the right medial leg as this seems to be the most inflamed area today where there is some questionable drainage as well. I am going to remove some of the calcium deposits which are loosening obtain a deeper culture from this area. Fortunately I do not see any evidence of active infection systemically which is good news but locally I think we are having a bigger issue here. He does have his appointment next Thursday with vascular. Patient has also been referred to [facility] for further evaluation and treatment of the calcinosis for possible sulfate injections. 03/04/2022: The culture that was performed last week  grew out multiple species including Klebsiella, Morganella, and Pseudomonas aeruginosa. He had been prescribed Bactrim but this was not adequate to cover all of the species and levofloxacin was recommended. He completed the Bactrim but did not pick up the levofloxacin and begin taking it until yesterday. 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 He had arterial studies performed today. He does have evidence of peripheral arterial disease. Results copied here: +-------+-----------+-----------+------------+------------+ ABI/TBIT oday's ABIT oday's TBIPrevious ABIPrevious TBI +-------+-----------+-----------+------------+------------+ Right  [place] 0.75    +-------+-----------+-----------+------------+------------+ Left [place] 0.57    +-------+-----------+-----------+------------+------------+ Arterial wall calcification precludes accurate ankle pressures and ABIs. Summary: Right: Resting right ankle-brachial index indicates noncompressible right lower extremity arteries. The right toe-brachial index is normal. Left: Resting left ankle-brachial index indicates noncompressible left lower extremity arteries. The left toe-brachial index is abnormal. He continues to have further tissue breakdown at the right medial calf and there is ample slough accumulation along with necrotic subcutaneous tissue. The left dorsal foot wound has built up slough, as well. The right medial foot wound is nearly closed with just a light layer of eschar over the surface. The right dorsal lateral foot wound has also accumulated a fair amount of slough and remains quite tender. 03/10/2022: He has been taking the prescribed levofloxacin and has about 3 days left of this. The erythema and induration on his right leg has improved. He continues to  experience further breakdown of the right medial calf wound. There is extensive slough and debris accumulation there. There is heavy slough on his left dorsal foot wound, but no odor or purulent drainage. The right medial foot wound appears to be closed. The right dorsal lateral foot wound also has a fair amount of slough but it is less tender than at our last visit. He still does not have an appointment with dermatology. 03/22/2022: The right anterior tibial wound has closed. The right lateral foot wound is nearly closed with just a little bit of slough. The left dorsal foot wound is smaller and continues to have some slough buildup but less so than at prior visits. There is good granulation tissue underlying the slough. Unfortunately the right medial calf wound continues to deteriorate. It has a foul odor today and a lot of necrotic tissue, the surrounding skin is also erythematous and moist. 03/30/2022: The right lateral foot wound continues to contract and just has a little bit of slough and eschar present. The left dorsal foot wound is also smaller with slough overlying good granulation tissue. The right medial calf wound actually looks quite a bit better today; there is no odor and there is much less necrotic tissue although some remains present. We have been using topical gentamicin and he has been taking oral Augmentin. 04/07/2022: The patient saw dermatology at [facility] last week. Unfortunately, it appears that they did not read any of the documentation that was provided. 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 Overall, it was a highly unsatisfactory consultation. In the meantime, however, we were able to identify compounding  pharmacy who could create a topical sodium thiosulfate ointment. The patient has that with him today. Both of the right foot wounds are now closed. The left dorsal foot wound has contracted but still has a layer of slough on the surface. The medial calf wounds on the right all look a little bit better. They still have heavy slough along with the chunks of calcium. Everything is stained purple. 04/14/2022: The wound on his dorsal left foot is a little bit smaller again today. There is still slough accumulation on the surface. The medial calf wounds have quite a bit less slough accumulation today. His right leg, however, is warm and erythematous, concerning for cellulitis. [name], [name] ([id number]) [id number] Page 4  of 17 04/14/2022: He completed his course of Augmentin yesterday. The medial calf wound sites are much cleaner today and shallower. There is less fragmented calcium in the wounds. He has a lot of lymphedema fluid-like drainage from these wounds, but no purulent discharge or malodor. The wound on his dorsal left foot has also contracted and is shallower. There is a layer of slough over healthy granulation tissue. 05/05/2022: The left dorsal foot wound is smaller today with less slough and more granulation tissue. The right medial calf wounds are shallower, but have extensive slough and some fat necrosis. The drainage has diminished considerably, however. 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 05/12/2022: The left dorsal foot wound continues to contract and fill with granulation tissue. There is slough on the wound surface. The right medial calf wounds also continue to fill with healthy tissue, but there is remaining slough and fat necrosis present. He had a significant increase in his drainage this week and it was a bright yellow-green color. He  also had a new wound open over his right anterior tibial surface associated with the spike of cutaneous calcium. The leg is more red, as well, concerning for infection. 05/19/2022: His culture returned positive for Pseudomonas. He is currently taking a course of oral levofloxacin. We have also been using topical gentamicin mixed in with his sodium thiosulfate. All of the wounds look better this week. 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 There is slough and nonviable subcutaneous tissue still present. The new anterior tibial wound just has a light layer of slough. The dorsal foot wound also has some slough present and appears a little dry today. 05/26/2022: The right medial leg wound continues to improve. It is feeling with granulation tissue, but still accumulates a fairly thick layer of slough on the surface. The more recent anterior tibial wound has remained quite small, but also has some slough on the surface. The left dorsal foot wound has contracted somewhat and has perimeter epithelialization. He completed his oral levofloxacin. 06/02/2022: The left dorsal foot wound continues to improve significantly. There is just a little slough on the wound surface. The right medial leg wound had black drainage, but it looks like silver alginate was applied last week and I theorized that silver sulfide was created with a combination of the silver alginate and the sodium thiosulfate. It did, however, have a bit of an odor to it and extensive slough accumulation. The small anterior tibial wound also had a bit of slough and nonviable subcutaneous tissue present. The skin of his leg was rather macerated, as well. 06/09/2022: The left dorsal foot wound is smaller again this week with just a light layer of slough on the surface. The right medial leg wound looks substantially better. The leg and periwound skin are no longer red and macerated. There is good granulation tissue  forming with less slough and nonviable tissue. The anterior tibial wound just has a small amount of slough accumulation. 06/16/2022: The left dorsal foot wound continues to contract. His wrap slipped a little bit, however, and his foot is more swollen. The right medial leg wound continues to improve. The underlying tissue is more vital-appearing and there is less slough. He does have substantial drainage. The small anterior tibial wound has a bit of slough accumulation. 06/23/2022: The left dorsal foot wound is about half the size  as it was last week. There is just some light slough on the surface and some eschar buildup around the edges. The right anterior leg wound is small with a little slough on the surface. Unfortunately, the right medial leg wound deteriorated fairly substantially. It is malodorous with gray/green/black discoloration. 06/30/2022: The left dorsal foot wound continues to contract. It is nearly closed with just a light layer of slough present. The right anterior leg wound is about the same size and has a small amount of slough on the surface. The right medial leg wound looks a lot better than it did last week. The odor has abated and the tissue is less pulpy and necrotic-looking. There is still a fairly thick layer of slough present. 12/13; the left dorsal foot wound looks very healthy. There was no need to change the dressing here and no debridement. He has a small area on the right anterior lower leg apparently had not changed much in size. The area on the medial leg is the most problematic area this is large with some depth and a completely nonviable surface today. 07/14/2022: The left dorsal foot wound is smaller today with just a little slough and eschar present. The right anterior lower leg wound is about the same without any significant change. The medial leg wound has a black and green surface and is malodorous. No purulent drainage and the periwound skin is intact.  Edema control is suboptimal. 07/21/2022: The dorsal foot wound continues to contract and is nearly closed with just a little bit of slough and eschar on the surface. The right anterior lower leg wound is shallower today but otherwise the dimensions are unchanged. The medial leg wound looks significantly better although it still has a nonviable surface; the odor and black/green surface has resolved. His culture came back with multiple species sensitive to Augmentin, which was prescribed and a very low level of Pseudomonas, which should be handled by the topical gentamicin we have been using. 07/28/2022: The dorsal foot wound is down to just a very tiny opening with a little eschar on the surface. The right anterior tibial wound is about the same with some slough buildup. The right medial leg wound looks quite a bit better this week. There is thick rubbery slough on the surface, but the odor and drainage has improved significantly. 08/04/2022: The dorsal foot wound is almost healed, but the periwound skin has broken down. This appears to be secondary to moisture and adhesive from the absorbent pad. The right anterior tibial wound is shallower. The right medial leg wound continues to improve. It is smaller, still with rubbery slough on the surface. No malodor or purulent drainage. 08/11/2022: The dorsal foot wound is about the same size. It is a little bit dry with some slough accumulation. The right anterior tibial wound is also unchanged. The right medial leg wound is filling in further with good granulation tissue. Still with some slough buildup on the surface. 08/18/2022: His left leg is bright red, hot, and swollen with cellulitis. The dorsal foot wound actually looks quite good and is superficial with just a little bit of slough accumulation. The right anterior tibial wound is unchanged. The right medial leg wound has filled in with good granulation tissue but has copious amounts of  drainage. 08/25/2022: His cellulitis has responded nicely to the oral antibiotics. His dorsal foot wound is smaller but has a fair amount of slough buildup. The right anterior tibial wound remains stable and unchanged. The right medial  leg wound has more granulation tissue under a layer of slough, but continues to have copious drainage. The drainage is actually causing skin irritation and threatens to result in breakdown. 09/01/2022: He continues to have profuse drainage from his wounds which is resulting in tissue maceration on both the dorsum of his left foot and the periwound distal to his medial leg wound. The dorsal foot wound is nearly healed, despite this. The medial right leg wound is filling in with good granulation tissue. We are working on getting home health assistance to change his dressings more frequently to try and control the drainage better. 09/08/2022: We have had success in controlling his drainage. The tissue maceration has improved significantly and his swelling has come down quite markedly. The medial right leg wound is much more superficial and has substantially less nonviable tissue present. The anterior tibial wound is a little bit larger, however with some nonviable tissue present. 09/15/2022: The dorsal foot wound is nearly closed. The anterior tibial wound measures slightly larger today. The medial right leg wound continues to improve quite dramatically with less nonviable tissue and more robust-looking granulation. Edema control is significantly better. 09/22/2022: The left dorsal foot wound remains open, albeit quite tiny. The anterior tibial wound is a little bit larger again today. The medial right leg wound continues to improve with an increase in robust and healthy granulation tissue and less accumulation of nonviable tissue. 09/29/2022: His home health providers wrapped his left leg extremely tightly and he ended up having to cut the wrap off of his foot. This resulted in  his foot being very swollen and that the dorsal foot wound is a little bit larger today. The anterior tibial wound is about the same size with a little slough on the surface. The medial right leg wound continues to fill in. There are very few areas with any significant depth. There is still slough accumulation. [name], BERRIEN ([id number]) [id number] Page 5 of 17 3/13; Patient has a left dorsal foot wound along with a right anterior tibial wound and right medial wound. We are using silver alginate to the left dorsal foot wound, Santyl and sodium thiosulfate compound ointment to the right lower extremity wounds under 4 layer compression. Patient has no issues or complaints today. 10/13/2022: The left dorsal foot wound is a little bit bigger today, but his foot is also a little bit more swollen. The right medial leg wound has filled in more and has substantially less depth. There is still some nonviable tissue present. The small anterior lower leg wound looks about the same. 10/20/2022: The left dorsal foot wound is epithelializing. There does not appear to be much open at this site. There is some eschar around the edges. The right medial leg wound continues to fill in with granulation tissue. The small anterior lower leg wound is a little bit bigger and there is a chunk of calcium protruding. He continues to have fairly copious drainage from the right leg. 10/27/2022: His home health agency failed to show up at all this past week and as a result, the excessive drainage from his right leg wound has resulted in tissue breakdown. His leg is red and irritated. The wounds themselves look about the same. The wound on his left dorsal foot is nearly closed underneath some eschar. 11/03/2022: We continue to have difficulty with his home health agency. He continues to have significant drainage from his right leg wound which has resulted in even further tissue breakdown. He also was not  taking his Lasix as prescribed and I think much of the drainage has been his lymphedema fluid choosing the path of least resistance. The wound on his left dorsal foot is closed, but in the process of cleaning the site, dry skin was pulled off and he has a new small superficial wound just adjacent to where the dorsal foot wound was. 11/12/2022: His left dorsal foot is completely healed. The right medial leg continues to deteriorate. He is not taking his Lasix as prescribed, nor is he using his lymphedema pumps at all. As a result, he has massive amounts of drainage saturating his dressings and causing further tissue breakdown on his medial leg. Today, his dressings had a foul odor, as well. 11/19/2022: His right medial leg looks even worse today. There is more erythema and moisture related tissue breakdown. The 2 anterior wounds look a little bit better. The culture that I took last week did produce Pseudomonas and he is currently taking levofloxacin for this. 11/26/2022: There has been significant improvement to his right medial leg. It is far less raw and many of the superficial open areas have epithelialized. He continues to cut the foot portion of his wrap and as result his foot is bulbous and swollen compared to the rest of his leg, where edema control is good. The smaller of the 2 anterior tibial wound is closed, while the larger is contracting somewhat with a bit of slough at the base. 12/03/2022: His leg continues to demonstrate improvement. There has been epithelialization of much of the denuded area. He has a new small anterior tibial wound that has opened around a nidus of calcium. Edema control is improved. 12/10/2022: Continued improvement in the periwound skin. The anterior tibial wounds are about the same, but the medial leg wound has improved further. There is less nonviable tissue present. 12/17/2022: Between his visit on Tuesday with the nurse and today, he has had significantly more  drainage which has resulted in some periwound tissue maceration. I suspect this correlates somewhat with his diuretic use, as he says he takes his diuretic religiously on Saturday, Sunday, and Monday and then does not take it after Tuesday due to being out and about and not being close to easy restroom access. I suspect the excess fluid is just simply draining out of his leg. 12/21/2022: This was supposed to be a nurse visit, but his leg has broken down so significantly, that we have converted to a wound care visit. He did take his diuretics over the weekend, but despite this, he has had copious drainage and all of his tissues have broken down substantially from the right medial leg ulcer all the way down to his ankle and even wrapping posterior to this. There is blue-green staining on his dressing, suggesting ongoing presence of Pseudomonas aeruginosa. 12/24/2022: The wound already looks better than it did 2 days ago. He has been taking ciprofloxacin and the Urgo clean dressing we applied did a much better job of wicking the drainage away from his leg. There is slough on the entire surface of the medial leg wound, but the erythema and induration has improved in the past couple of days. 12/31/2022: Continued improvement of the wound. He says that the pain has basically abated since being on ciprofloxacin. Still with fairly heavy drainage and slough accumulation. 01/07/2023: Overall continued improvement. Still with heavy slough buildup, but less drainage. 01/14/2023: There is a little bit of improvement. The skin is still looking a bit excoriated. He has a  few more days left of ciprofloxacin. 01/21/2023: There has been significant improvement. The periwound skin is in much better condition. The ulcers are shallower and actually have some good granulation tissue present under some slough. 01/28/2023: The wound continues to improve. All of the open areas measured smaller today. There is slough accumulation on  the surfaces. His drainage has decreased markedly. 02/04/2023: Continued improvement in the wound. Drainage remains significantly diminished. Slough accumulation is present but to a lesser extent. Periwound skin continues to improve. Edema control is good. 02/18/2023: The heavy drainage persists, although it is was not as significant today as it was at his nurse visit on Tuesday. The drainage is yellow and does not have the blue-green discoloration suggestive of Pseudomonas. The actual wounds on his leg are getting smaller, but the periwound skin is quite excoriated. 02/25/2023: His periwound skin is quite excoriated and he has a lot of clear yellow drainage. The main wounds are filling in further and epithelializing. 03/04/2023: The excoriation of the periwound skin has improved. Most of that area has dried up. The main wounds have less slough in them today. 03/11/2023: The main wounds have accumulated a little bit of slough. The periwound excoriation continues to improve. Unfortunately, he suffered a fall over the previous weekend and has abrasions on his knees bilaterally and on his right wrist and upper arm. In addition, the skin on his left lower leg apparently blistered and opened, leaving several superficial wounds on this extremity. He has been approved for the [facility] and Press wound VAC and has the equipment with him today. 03/18/2023: He has extensive excoriation to the periwound skin on the right medial lower leg. The actual wounds themselves are smaller with slough accumulation. The top of his left foot has begun weeping again. Using the [facility] and Press wound VAC, he says that he through 4 canisters. He seems to be very fluid overloaded, without any significant dyspnea. 03/25/2023: The wound to his left anterior tibial surface has healed. The left dorsal foot wound has gotten smaller and has a small area of hypertrophic granulation tissue present. The periwound skin on the right medial lower leg  looks slightly better, but still appears excoriated. The actual wounds continue to contract but are still accumulating quite a bit of slough. The tiny right anterior leg wounds are contracting. 04/01/2023: The left dorsal foot wound got a little bit bigger this week. There is slough accumulation. The periwound skin on the right medial lower leg continues to improve. The main medial right lower leg wounds have a dark gray discoloration and there is a strong odor coming from them. The back of his right lower leg just above the ankle also has a little bit of new breakdown where he is draining edema fluid. The small anterior leg wounds are stable. [name], [name] ([id number]) [id number] Page 6 of 17 04/08/2023: Since starting antibiotics last week, there has been tremendous improvement. The left dorsal foot wound is nearly closed with just a little bit of eschar on the surface. The right medial lower leg is less excoriated and red and he has had substantially less drainage. The amount of slough on the open areas of his wounds has decreased markedly and there is no longer any odor. The back of his right lower leg that was newly open last week has dried up, but is not completely closed yet. 04/15/2023: The improvement continues with significantly less drainage, resulting in less excoriation and irritation. The back of his right lower leg is  closed, as is the left dorsal foot wound. The open areas on the right medial leg are smaller. 04/22/2023: His drainage has diminished significantly. The tissue maceration and excoriation surrounding the right medial leg wound has also improved substantially. There is slough on all of the open wound surfaces. He does have a bit of an odor coming from his wound today, but there is no purulent drainage nor any significant discoloration of the drainage in his VAC. 04/29/2023: He has a new wound on his left lower anterior tibial surface. This was a blister  that came up and then ruptured. There is thin slough on the wound surface but no concern for infection. He has had significantly diminished drainage from his right medial leg ulcers. He was previously going through 4 canisters a day secondary to drainage and this week, he has used only 1 for the entire week. The open areas on his right medial leg are smaller and fewer in number. There is slough on all of the surfaces. No malodor appreciated. 05/06/2023: The wound on his left anterior tibial surface has epithelialized significantly. There is just a small area open in the center. The wound is clean without any slough or other debris accumulation. The drainage on the right leg has almost completely dry. There is slough on the open wound surfaces with significantly more epithelialized between the open sites. He continues on levofloxacin. 05/13/2023: His left lower leg wound is healed. The right lower leg wound continues to improve. The drainage has been minimal and he has not been in the wound VAC for over a week now. There is better skin filling between the open areas and less slough accumulation on the open surfaces. 05/20/2023: The anterior leg wounds are closing up and there is no slough on the open areas that remain. His medial leg wound was measured longer today, but this appears to include some stuck on silver alginate and dry eschar, rather than the actual open wound areas. There is slough accumulation on the open surfaces. Edema control is good and his drainage has not been excessive. 05/27/2023: His wrap slipped up on his foot and he has a new small ulcer on the dorsal aspect of his right foot. The other wounds are basically stable with the usual thick slough accumulation. Drainage remains well-controlled. Edema control is good. 06/03/2023: The dorsal foot ulcer is healed. The other wounds actually are quite a bit smaller this week. There is less depth to the large medial leg ulcers. Edema control  is excellent. Electronic Signature(s) Signed: 06/03/2023 12:00:42 PM By: [name] Guess MD FACS Entered By: [name] on 06/03/2023 12:00:42 -------------------------------------------------------------------------------- Physical Exam Details Patient Name: Date of Service: [name], [name] 06/03/2023 11:00 A M Medical Record Number: [id number] Patient Account Number: [id number] Date of Birth/Sex: Treating RN: [birth year] (72 y.o. M) Primary Care Provider: Nicoletta [name] Other Clinician: Referring Provider: Treating Provider/Extender: [name] in Treatment: 42 Constitutional . . . . no acute distress. Respiratory Normal work of breathing on room air.. Notes 06/03/2023: The dorsal foot ulcer is healed. The other wounds actually are quite a bit smaller this week. There is less depth to the large medial leg ulcers. Edema control is excellent. Electronic Signature(s) Signed: 06/03/2023 12:01:12 PM By: [name] Guess MD FACS Entered By: [name] on 06/03/2023 12:01:12 -------------------------------------------------------------------------------- Physician Orders Details Patient Name: Date of Service: [name], [name] 06/03/2023 11:00 A Barnie Del ([id number]) [id number] Page 7 of 17 Medical Record Number: [id number] Patient Account Number:  [id number] Date of Birth/Sex: Treating RN: Mar 16, 1951 (72 y.o. 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 - Dr. [name] RM 1 Return A Anesthetic (In clinic) Topical Lidocaine 4% applied to wound bed Bathing/ Shower/ Hygiene May shower with protection but do not get wound dressing(s) wet. Protect dressing(s) with water repellant cover (for example, large plastic bag) or a cast cover and may then take shower. Lymphedema Treatment Plan - Exercise, Compression and Elevation Bilateral Lower Extremities Exercise daily as tolerated. (Walking, ROM, Calf Pumps and Toe Taps) Compression Garments, Class II. Wear daily when out of bed. May remove at bedtime Compression Wraps as ordered Elevate legs 30 - 60 minutes at or above heart level at least 3 - 4 times daily as able/tolerated Avoid standing for long periods and elevate leg(s) parallel to the floor when sitting Use Pneumatic Compression Device on leg(s) 2-3 times a day for 45-60 minutes Wound Treatment Wound #3 - Lower Leg Wound Laterality: Right, Medial Cleanser: Soap and Water 1 x Per Week/30 Days Discharge Instructions: May shower and wash wound with dial antibacterial soap and water prior to dressing change. Cleanser: Wound Cleanser 1 x Per Week/30 Days Discharge Instructions: Cleanse the wound with wound cleanser prior to applying a clean dressing using gauze sponges, not tissue or cotton balls. Peri-Wound Care: Skin Prep 1 x Per Week/30 Days Discharge Instructions: cavelon to macerated wound margins Peri-Wound Care: Sween Lotion (Moisturizing lotion) 1 x Per Week/30 Days Discharge Instructions: Apply moisturizing lotion as directed Prim Dressing: Maxorb Extra Ag+ Alginate  Dressing, 4x4.75 (in/in) 1 x Per Week/30 Days ary Discharge Instructions: Apply to excoriated weeping areas Secondary Dressing: ABD Pad, 8x10 1 x Per Week/30 Days Discharge Instructions: Apply over primary dressing as directed. Compression Wrap: ThreePress (3 layer compression wrap) 1 x Per Week/30 Days Discharge Instructions: Apply three layer compression as directed. Compression Wrap: Unnaboot w/Calamine, 4x10 (in/yd) 1 x Per Week/30 Days Discharge Instructions: Apply Unnaboot on distal foot and top of lower leg Wound #6 - Lower Leg Wound Laterality: Right, Anterior Cleanser: Soap and Water 1 x Per Week/30 Days Discharge Instructions: May shower and wash wound with dial antibacterial soap and water prior to dressing change. Cleanser: Wound Cleanser 1 x Per Week/30 Days Discharge Instructions: Cleanse the wound with wound cleanser prior to applying a clean dressing using gauze sponges, not tissue or cotton balls. Peri-Wound Care: Sween Lotion (Moisturizing lotion) 1 x Per Week/30 Days [name], [name] ([id number]) [id number] Page 8 of 17 Discharge Instructions: Apply moisturizing lotion as directed Prim Dressing: Maxorb Extra Ag+ Alginate Dressing, 2x2 (in/in) 1 x Per Week/30 Days ary Discharge Instructions: Apply to wound bed as instructed Secondary Dressing: ABD Pad, 8x10 1 x Per Week/30 Days Discharge Instructions: Apply over primary dressing as directed. Compression Wrap: ThreePress (3 layer compression wrap) 1 x Per Week/30 Days Discharge Instructions: Apply  three layer compression as directed. Patient Medications llergies: No Known Allergies A Notifications Medication Indication Start End 06/03/2023 levofloxacin DOSE oral 750 mg tablet - 1 tab PO q day x 30 days Electronic Signature(s) Signed: 06/03/2023 12:31:22 PM By: [name] Guess MD FACS Previous Signature: 06/03/2023 12:02:14 PM Version By: [name] Guess MD FACS Entered By: [name] on  06/03/2023 12:03:12 -------------------------------------------------------------------------------- Problem List Details Patient Name: Date of Service: [name] 06/03/2023 11:00 A M Medical Record Number: [id number] Patient Account Number: [id number] Date of Birth/Sex: Treating RN: [birth year] (72 y.o. 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 02/09/2022 No Yes I25.10 Atherosclerotic heart disease of native coronary artery without angina pectoris 02/09/2022 No Yes I89.0 Lymphedema, not elsewhere classified 02/09/2022 No Yes I87.2 Venous insufficiency (chronic) (peripheral) 02/09/2022 No Yes Z86.718 Personal history of other venous thrombosis and embolism 02/09/2022 No Yes I10 Essential (primary) hypertension 02/09/2022 No Yes [name], [name] ([id number]) [id number] Page 9 of 17 Inactive Problems Resolved Problems ICD-10 Code Description Active Date Resolved Date L97.822 Non-pressure chronic ulcer of other part of left lower leg with fat layer exposed 04/29/2023 04/29/2023 L97.821 Non-pressure chronic ulcer of other part of left lower leg limited to breakdown of skin 03/11/2023 03/11/2023 L98.492 Non-pressure chronic ulcer of skin of other sites with fat layer exposed 03/11/2023 03/11/2023 L97.521 Non-pressure chronic ulcer of other part of left foot limited to breakdown of skin 03/18/2023 02/09/2022 L97.512 Non-pressure chronic ulcer of other part of right foot with fat layer exposed 05/27/2023 02/17/2022 Electronic Signature(s) Signed: 06/03/2023 11:59:06 AM By: [name] Guess MD FACS Entered By: [name] on 06/03/2023  11:59:06 -------------------------------------------------------------------------------- Progress Note Details Patient Name: Date of Service: [name] 06/03/2023 11:00 A M Medical Record Number: [id number] Patient Account Number: [id number] Date of Birth/Sex: Treating RN: September 12, 1950 (72 y.o. M) Primary Care Provider: Nicoletta [name] Other Clinician: Referring Provider: Treating Provider/Extender: [name] in Treatment: 68 Subjective Chief Complaint Information obtained from Patient Patient seen for complaints of Non-Healing Wounds. History of Present Illness (HPI) ADMISSION 02/09/2022 This is a 72 year old man who recently moved to [place] from [place]. He has a history of of coronary artery disease and prior left popliteal vein DVT . He is not diabetic and does not smoke. 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 He has had multiple spinal surgeries and has limited mobility. He primarily uses a motorized wheelchair. He has had lymphedema for many years and does have lymphedema pumps although he says he does not use them frequently. He has wounds on his bilateral lower extremities. He was receiving care in [place] for these prior to his move. They were apparently packing his wounds with Betadine soaked gauze. He has worn compression wraps in the past but not recently. He did say that they helped tremendously. In clinic today, his right ABI is noncompressible but has good signals; the left ABI was 1.17. No formal vascular studies have been performed. On his left dorsal foot, there is a circular lesion that exposes the fat layer. There is fairly heavy slough accumulation within the wound, but no significant odor. On his right medial calf, he has multiple pinholes that have slough buildup in them and probe for about 2 to  4 mm in depth. They are draining clear fluid. On his right lateral midfoot, he has ulceration consistent with venous  stasis. It is geographic and has slough accumulation. 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 02/17/2022: All of the wounds are little bit larger today and have more slough accumulation. Today, the medial calf openings are larger and probe to something that feels calcified. There is odor coming from his wounds. His vascular studies are scheduled for August 9 and 10. 02-24-2022 upon evaluation today patient presents for follow-up concerning his ongoing issues here with his lower extremities. With that being said he does have significant problems with what appears to be cellulitis unfortunately the PCR culture that was sent last week got crushed by UPS in transit. With that being said we are going to reobtain a culture today although I am actually to do it on the right medial leg as this seems to be the most inflamed area today where there is some questionable drainage as well. I am going to remove some of the calcium deposits which are loosening obtain a deeper culture from this area. Fortunately I do not see any evidence of active infection systemically which is good news but locally I think we are having a bigger issue here. He does have his appointment next Thursday with vascular. Patient has also been referred to [facility] for further evaluation and treatment of the calcinosis for possible sulfate injections. [name], [name] ([id number]) [id number] Page 10 of 17 03/04/2022: The culture that was performed last week grew out multiple species including Klebsiella, Morganella, and Pseudomonas aeruginosa. He had been prescribed Bactrim but this was not adequate to cover all of the species and levofloxacin was recommended. He completed the Bactrim but did not pick up the levofloxacin and begin taking it until yesterday. 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 He had arterial studies performed today. He does have evidence of peripheral arterial disease. Results copied here: +-------+-----------+-----------+------------+------------+ ABI/TBIT oday's ABIT oday's TBIPrevious ABIPrevious TBI +-------+-----------+-----------+------------+------------+ Right [place] 0.75    +-------+-----------+-----------+------------+------------+ Left [place] 0.57    +-------+-----------+-----------+------------+------------+ Arterial wall calcification precludes accurate ankle pressures and ABIs. Summary: Right: Resting right ankle-brachial index indicates noncompressible right lower extremity arteries. The right toe-brachial index is normal. Left: Resting left ankle-brachial index indicates noncompressible left lower extremity arteries. The left toe-brachial index is abnormal. He continues to have further tissue breakdown at the right medial calf and there is ample slough accumulation along with necrotic subcutaneous tissue. The left dorsal foot wound has built up slough, as well. The right medial foot wound is nearly closed with just a light layer of eschar over the surface. The right dorsal lateral foot wound has also accumulated a fair amount of slough and remains quite tender. 03/10/2022: He has been taking the prescribed levofloxacin and has about 3 days left of this. The erythema and induration on his right leg has improved. He continues to experience further breakdown of the right medial calf wound. There is extensive slough and debris accumulation there. There is heavy slough on his left dorsal foot wound, but no odor or purulent drainage. The right medial foot wound appears to be closed. The right dorsal lateral foot wound also has a fair amount of slough but it is less tender than at our  last visit. He still does  not have an appointment with dermatology. 03/22/2022: The right anterior tibial wound has closed. The right lateral foot wound is nearly closed with just a little bit of slough. The left dorsal foot wound is smaller and continues to have some slough buildup but less so than at prior visits. There is good granulation tissue underlying the slough. Unfortunately the right medial calf wound continues to deteriorate. It has a foul odor today and a lot of necrotic tissue, the surrounding skin is also erythematous and moist. 03/30/2022: The right lateral foot wound continues to contract and just has a little bit of slough and eschar present. The left dorsal foot wound is also smaller with slough overlying good granulation tissue. The right medial calf wound actually looks quite a bit better today; there is no odor and there is much less necrotic tissue although some remains present. We have been using topical gentamicin and he has been taking oral Augmentin. 04/07/2022: The patient saw dermatology at [facility] last week. Unfortunately, it appears that they did not read any of the documentation that was provided. 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 Overall, it was a highly unsatisfactory consultation. In the meantime, however, we were able to identify compounding pharmacy who could create a topical sodium thiosulfate ointment. The patient has that with him today. Both of the right foot wounds are now closed. The left dorsal foot wound has contracted but still has a layer of slough on the surface. The medial calf wounds on the right all look a little bit better. They still have heavy slough along with the chunks of calcium. Everything is  stained purple. 04/14/2022: The wound on his dorsal left foot is a little bit smaller again today. There is still slough accumulation on the surface. The medial calf wounds have quite a bit less slough accumulation today. His right leg, however, is warm and erythematous, concerning for cellulitis. 04/14/2022: He completed his course of Augmentin yesterday. The medial calf wound sites are much cleaner today and shallower. There is less fragmented calcium in the wounds. He has a lot of lymphedema fluid-like drainage from these wounds, but no purulent discharge or malodor. The wound on his dorsal left foot has also contracted and is shallower. There is a layer of slough over healthy granulation tissue. 05/05/2022: The left dorsal foot wound is smaller today with less slough and more granulation tissue. The right medial calf wounds are shallower, but have extensive slough and some fat necrosis. The drainage has diminished considerably, however. 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 05/12/2022: The left dorsal foot wound continues to contract and fill with granulation tissue. There is slough on the wound surface. The right medial calf wounds also continue to fill with healthy tissue, but there is remaining slough and fat necrosis present. He had a significant increase in his drainage this week and it was a bright yellow-green color. He also had a new wound open over his right anterior tibial surface associated with the spike of cutaneous calcium. The leg is more red, as well, concerning for infection. 05/19/2022: His culture returned positive  for Pseudomonas. He is currently taking a course of oral levofloxacin. We have also been using topical gentamicin mixed in with his sodium thiosulfate. All of the wounds look better this week. 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 There is slough and nonviable subcutaneous tissue still present. The new anterior tibial wound just has a light layer of slough. The dorsal foot wound also has some slough present and appears a little dry today. 05/26/2022: The right medial leg wound continues to improve. It is feeling with granulation tissue, but still accumulates a fairly thick layer of slough on the surface. The more recent anterior tibial wound has remained quite small, but also has some slough on the surface. The left dorsal foot wound has contracted somewhat and has perimeter epithelialization. He completed his oral levofloxacin. 06/02/2022: The left dorsal foot wound continues to improve significantly. There is just a little slough on the wound surface. The right medial leg wound had black drainage, but it looks like silver alginate was applied last week and I theorized that silver sulfide was created with a combination of the silver alginate and the sodium thiosulfate. It did, however, have a bit of an odor to it and extensive slough accumulation. The small anterior tibial wound also had a bit of slough and nonviable subcutaneous tissue present. The skin of his leg was rather macerated, as well. 06/09/2022: The left dorsal foot wound is smaller again this week with just a light layer of slough on the surface. The right medial leg wound looks substantially better. The leg and periwound skin are no longer red and macerated. There is good granulation tissue forming with less slough and nonviable tissue. The anterior tibial wound just has a small amount of slough accumulation. 06/16/2022: The left dorsal foot wound continues to contract. His wrap slipped a little bit, however, and his foot is more swollen. The right medial leg wound continues to improve. The underlying tissue is more vital-appearing and there is less slough. He does have substantial drainage. The small anterior tibial wound has a bit of slough  accumulation. 06/23/2022: The left dorsal foot wound is about half the size as it was last week. There is just some light slough on the surface and some eschar buildup around the edges. The right anterior leg wound is small with a little slough on the surface. Unfortunately, the right medial leg wound deteriorated fairly substantially. It is [name], [name] ([id number]) [id number] Page 11 of 17 malodorous with gray/green/black discoloration. 06/30/2022: The left dorsal foot wound continues to contract. It is nearly closed with just a light layer of slough present. The right anterior leg wound is about the same size and has a small amount of slough on the surface. The right medial leg wound looks a lot better than it did last week. The odor has abated and the tissue is less pulpy and necrotic-looking. There is still a fairly thick layer of slough present. 12/13; the left dorsal foot wound looks very healthy. There was no need to change the dressing here and no debridement. He has a small area on the right anterior lower leg apparently had not changed much in size. The area on the medial leg is the most problematic area this is large with some depth and a completely nonviable surface today. 07/14/2022: The left dorsal foot wound is smaller today with just a little slough and eschar present. The right anterior lower leg wound is about the same without any significant  change. The medial leg wound has a black and green surface and is malodorous. No purulent drainage and the periwound skin is intact. Edema control is suboptimal. 07/21/2022: The dorsal foot wound continues to contract and is nearly closed with just a little bit of slough and eschar on the surface. The right anterior lower leg wound is shallower today but otherwise the dimensions are unchanged. The medial leg wound looks significantly better although it still has a nonviable surface; the odor and black/green surface  has resolved. His culture came back with multiple species sensitive to Augmentin, which was prescribed and a very low level of Pseudomonas, which should be handled by the topical gentamicin we have been using. 07/28/2022: The dorsal foot wound is down to just a very tiny opening with a little eschar on the surface. The right anterior tibial wound is about the same with some slough buildup. The right medial leg wound looks quite a bit better this week. There is thick rubbery slough on the surface, but the odor and drainage has improved significantly. 08/04/2022: The dorsal foot wound is almost healed, but the periwound skin has broken down. This appears to be secondary to moisture and adhesive from the absorbent pad. The right anterior tibial wound is shallower. The right medial leg wound continues to improve. It is smaller, still with rubbery slough on the surface. No malodor or purulent drainage. 08/11/2022: The dorsal foot wound is about the same size. It is a little bit dry with some slough accumulation. The right anterior tibial wound is also unchanged. The right medial leg wound is filling in further with good granulation tissue. Still with some slough buildup on the surface. 08/18/2022: His left leg is bright red, hot, and swollen with cellulitis. The dorsal foot wound actually looks quite good and is superficial with just a little bit of slough accumulation. The right anterior tibial wound is unchanged. The right medial leg wound has filled in with good granulation tissue but has copious amounts of drainage. 08/25/2022: His cellulitis has responded nicely to the oral antibiotics. His dorsal foot wound is smaller but has a fair amount of slough buildup. The right anterior tibial wound remains stable and unchanged. The right medial leg wound has more granulation tissue under a layer of slough, but continues to have copious drainage. The drainage is actually causing skin irritation and threatens to  result in breakdown. 09/01/2022: He continues to have profuse drainage from his wounds which is resulting in tissue maceration on both the dorsum of his left foot and the periwound distal to his medial leg wound. The dorsal foot wound is nearly healed, despite this. The medial right leg wound is filling in with good granulation tissue. We are working on getting home health assistance to change his dressings more frequently to try and control the drainage better. 09/08/2022: We have had success in controlling his drainage. The tissue maceration has improved significantly and his swelling has come down quite markedly. The medial right leg wound is much more superficial and has substantially less nonviable tissue present. The anterior tibial wound is a little bit larger, however with some nonviable tissue present. 09/15/2022: The dorsal foot wound is nearly closed. The anterior tibial wound measures slightly larger today. The medial right leg wound continues to improve quite dramatically with less nonviable tissue and more robust-looking granulation. Edema control is significantly better. 09/22/2022: The left dorsal foot wound remains open, albeit quite tiny. The anterior tibial wound is a little bit larger again  today. The medial right leg wound continues to improve with an increase in robust and healthy granulation tissue and less accumulation of nonviable tissue. 09/29/2022: His home health providers wrapped his left leg extremely tightly and he ended up having to cut the wrap off of his foot. This resulted in his foot being very swollen and that the dorsal foot wound is a little bit larger today. The anterior tibial wound is about the same size with a little slough on the surface. The medial right leg wound continues to fill in. There are very few areas with any significant depth. There is still slough accumulation. 3/13; Patient has a left dorsal foot wound along with a right anterior tibial wound and right  medial wound. We are using silver alginate to the left dorsal foot wound, Santyl and sodium thiosulfate compound ointment to the right lower extremity wounds under 4 layer compression. Patient has no issues or complaints today. 10/13/2022: The left dorsal foot wound is a little bit bigger today, but his foot is also a little bit more swollen. The right medial leg wound has filled in more and has substantially less depth. There is still some nonviable tissue present. The small anterior lower leg wound looks about the same. 10/20/2022: The left dorsal foot wound is epithelializing. There does not appear to be much open at this site. There is some eschar around the edges. The right medial leg wound continues to fill in with granulation tissue. The small anterior lower leg wound is a little bit bigger and there is a chunk of calcium protruding. He continues to have fairly copious drainage from the right leg. 10/27/2022: His home health agency failed to show up at all this past week and as a result, the excessive drainage from his right leg wound has resulted in tissue breakdown. His leg is red and irritated. The wounds themselves look about the same. The wound on his left dorsal foot is nearly closed underneath some eschar. 11/03/2022: We continue to have difficulty with his home health agency. He continues to have significant drainage from his right leg wound which has resulted in even further tissue breakdown. He also was not taking his Lasix as prescribed and I think much of the drainage has been his lymphedema fluid choosing the path of least resistance. The wound on his left dorsal foot is closed, but in the process of cleaning the site, dry skin was pulled off and he has a new small superficial wound just adjacent to where the dorsal foot wound was. 11/12/2022: His left dorsal foot is completely healed. The right medial leg continues to deteriorate. He is not taking his Lasix as prescribed, nor is he  using his lymphedema pumps at all. As a result, he has massive amounts of drainage saturating his dressings and causing further tissue breakdown on his medial leg. Today, his dressings had a foul odor, as well. 11/19/2022: His right medial leg looks even worse today. There is more erythema and moisture related tissue breakdown. The 2 anterior wounds look a little bit better. The culture that I took last week did produce Pseudomonas and he is currently taking levofloxacin for this. 11/26/2022: There has been significant improvement to his right medial leg. It is far less raw and many of the superficial open areas have epithelialized. He continues to cut the foot portion of his wrap and as result his foot is bulbous and swollen compared to the rest of his leg, where edema control is good. The  smaller of the 2 anterior tibial wound is closed, while the larger is contracting somewhat with a bit of slough at the base. 12/03/2022: His leg continues to demonstrate improvement. There has been epithelialization of much of the denuded area. He has a new small anterior tibial wound that has opened around a nidus of calcium. Edema control is improved. 12/10/2022: Continued improvement in the periwound skin. The anterior tibial wounds are about the same, but the medial leg wound has improved further. There is less nonviable tissue present. CASMER, LANE ([id number]) [id number] Page 12 of 17 12/17/2022: Between his visit on Tuesday with the nurse and today, he has had significantly more drainage which has resulted in some periwound tissue maceration. I suspect this correlates somewhat with his diuretic use, as he says he takes his diuretic religiously on Saturday, Sunday, and Monday and then does not take it after Tuesday due to being out and about and not being close to easy restroom access. I suspect the excess fluid is just simply draining out of his leg. 12/21/2022: This was supposed to be  a nurse visit, but his leg has broken down so significantly, that we have converted to a wound care visit. He did take his diuretics over the weekend, but despite this, he has had copious drainage and all of his tissues have broken down substantially from the right medial leg ulcer all the way down to his ankle and even wrapping posterior to this. There is blue-green staining on his dressing, suggesting ongoing presence of Pseudomonas aeruginosa. 12/24/2022: The wound already looks better than it did 2 days ago. He has been taking ciprofloxacin and the Urgo clean dressing we applied did a much better job of wicking the drainage away from his leg. There is slough on the entire surface of the medial leg wound, but the erythema and induration has improved in the past couple of days. 12/31/2022: Continued improvement of the wound. He says that the pain has basically abated since being on ciprofloxacin. Still with fairly heavy drainage and slough accumulation. 01/07/2023: Overall continued improvement. Still with heavy slough buildup, but less drainage. 01/14/2023: There is a little bit of improvement. The skin is still looking a bit excoriated. He has a few more days left of ciprofloxacin. 01/21/2023: There has been significant improvement. The periwound skin is in much better condition. The ulcers are shallower and actually have some good granulation tissue present under some slough. 01/28/2023: The wound continues to improve. All of the open areas measured smaller today. There is slough accumulation on the surfaces. His drainage has decreased markedly. 02/04/2023: Continued improvement in the wound. Drainage remains significantly diminished. Slough accumulation is present but to a lesser extent. Periwound skin continues to improve. Edema control is good. 02/18/2023: The heavy drainage persists, although it is was not as significant today as it was at his nurse visit on Tuesday. The drainage is yellow and does  not have the blue-green discoloration suggestive of Pseudomonas. The actual wounds on his leg are getting smaller, but the periwound skin is quite excoriated. 02/25/2023: His periwound skin is quite excoriated and he has a lot of clear yellow drainage. The main wounds are filling in further and epithelializing. 03/04/2023: The excoriation of the periwound skin has improved. Most of that area has dried up. The main wounds have less slough in them today. 03/11/2023: The main wounds have accumulated a little bit of slough. The periwound excoriation continues to improve. Unfortunately, he suffered a fall over the previous weekend  and has abrasions on his knees bilaterally and on his right wrist and upper arm. In addition, the skin on his left lower leg apparently blistered and opened, leaving several superficial wounds on this extremity. He has been approved for the [facility] wound VAC and has the equipment with him today. 03/18/2023: He has extensive excoriation to the periwound skin on the right medial lower leg. The actual wounds themselves are smaller with slough accumulation. The top of his left foot has begun weeping again. Using the [facility] wound VAC, he says that he through 4 canisters. He seems to be very fluid overloaded, without any significant dyspnea. 03/25/2023: The wound to his left anterior tibial surface has healed. The left dorsal foot wound has gotten smaller and has a small area of hypertrophic granulation tissue present. The periwound skin on the right medial lower leg looks slightly better, but still appears excoriated. The actual wounds continue to contract but are still accumulating quite a bit of slough. The tiny right anterior leg wounds are contracting. 04/01/2023: The left dorsal foot wound got a little bit bigger this week. There is slough accumulation. The periwound skin on the right medial lower leg continues to improve. The main medial right lower leg wounds have a dark  gray discoloration and there is a strong odor coming from them. The back of his right lower leg just above the ankle also has a little bit of new breakdown where he is draining edema fluid. The small anterior leg wounds are stable. 04/08/2023: Since starting antibiotics last week, there has been tremendous improvement. The left dorsal foot wound is nearly closed with just a little bit of eschar on the surface. The right medial lower leg is less excoriated and red and he has had substantially less drainage. The amount of slough on the open areas of his wounds has decreased markedly and there is no longer any odor. The back of his right lower leg that was newly open last week has dried up, but is not completely closed yet. 04/15/2023: The improvement continues with significantly less drainage, resulting in less excoriation and irritation. The back of his right lower leg is closed, as is the left dorsal foot wound. The open areas on the right medial leg are smaller. 04/22/2023: His drainage has diminished significantly. The tissue maceration and excoriation surrounding the right medial leg wound has also improved substantially. There is slough on all of the open wound surfaces. He does have a bit of an odor coming from his wound today, but there is no purulent drainage nor any significant discoloration of the drainage in his VAC. 04/29/2023: He has a new wound on his left lower anterior tibial surface. This was a blister that came up and then ruptured. There is thin slough on the wound surface but no concern for infection. He has had significantly diminished drainage from his right medial leg ulcers. He was previously going through 4 canisters a day secondary to drainage and this week, he has used only 1 for the entire week. The open areas on his right medial leg are smaller and fewer in number. There is slough on all of the surfaces. No malodor appreciated. 05/06/2023: The wound on his left anterior tibial  surface has epithelialized significantly. There is just a small area open in the center. The wound is clean without any slough or other debris accumulation. The drainage on the right leg has almost completely dry. There is slough on the open wound  surfaces with significantly more epithelialized between the open sites. He continues on levofloxacin. 05/13/2023: His left lower leg wound is healed. The right lower leg wound continues to improve. The drainage has been minimal and he has not been in the wound VAC for over a week now. There is better skin filling between the open areas and less slough accumulation on the open surfaces. 05/20/2023: The anterior leg wounds are closing up and there is no slough on the open areas that remain. His medial leg wound was measured longer today, but this appears to include some stuck on silver alginate and dry eschar, rather than the actual open wound areas. There is slough accumulation on the open surfaces. Edema control is good and his drainage has not been excessive. 05/27/2023: His wrap slipped up on his foot and he has a new small ulcer on the dorsal aspect of his right foot. The other wounds are basically stable with the usual thick slough accumulation. Drainage remains well-controlled. Edema control is good. 06/03/2023: The dorsal foot ulcer is healed. The other wounds actually are quite a bit smaller this week. There is less depth to the large medial leg ulcers. Edema control is excellent. Patient History Information obtained from Patient, Chart. KANDON, PENNING ([id number]) [id number] Page 13 of 17 Family History Cancer - Mother, Heart Disease - Father,Mother, Hypertension - Father,Siblings, Lung Disease - Siblings, No family history of Diabetes, Hereditary Spherocytosis, Kidney Disease, Seizures, Stroke, Thyroid Problems, Tuberculosis. Social History Former smoker - quit 1991, Marital Status - Married, Alcohol Use - Moderate, Drug  Use - No History, Caffeine Use - Daily - coffee. Medical History Eyes Patient has history of Cataracts - right eye removed Denies history of Glaucoma, Optic Neuritis Ear/Nose/Mouth/Throat Denies history of Chronic sinus problems/congestion, Middle ear problems Hematologic/Lymphatic Patient has history of Anemia - macrocytic, Lymphedema Cardiovascular Patient has history of Congestive Heart Failure, Deep Vein Thrombosis - left leg, Hypertension, Peripheral Venous Disease Endocrine Denies history of Type I Diabetes, Type II Diabetes Genitourinary Denies history of End Stage Renal Disease Integumentary (Skin) Denies history of History of Burn Oncologic Denies history of Received Chemotherapy, Received Radiation Psychiatric Denies history of Anorexia/bulimia, Confinement Anxiety Hospitalization/Surgery History - cervical fusion. - lumbar surgery. - coronary stent placement. - lasik eye surgery. - hydrocele excision. Medical A Surgical History Notes nd Constitutional Symptoms (General Health) obesity Ear/Nose/Mouth/Throat hard of hearing Respiratory frozen diaphragm right Cardiovascular hyperlipidemia Musculoskeletal myositis, lumbar DDD, spinal stenosis, cervical facet joint syndrome Objective Constitutional no acute distress. Vitals Time Taken: 11:05 AM, Height: 71 in, Weight: 267 lbs, BMI: 37.2, Temperature: 98.9 F, Pulse: 66 bpm, Respiratory Rate: 18 breaths/min, Blood Pressure: 126/65 mmHg. Respiratory Normal work of breathing on room air.. General Notes: 06/03/2023: The dorsal foot ulcer is healed. The other wounds actually are quite a bit smaller this week. There is less depth to the large medial leg ulcers. Edema control is excellent. Integumentary (Hair, Skin) Wound #11 status is Healed - Epithelialized. Original cause of wound was Gradually Appeared. The date acquired was: 05/27/2023. The wound has been in treatment 1 weeks. The wound is located on the Right,Dorsal  Foot. The wound measures 0cm length x 0cm width x 0cm depth; 0cm^2 area and 0cm^3 volume. There is Fat Layer (Subcutaneous Tissue) exposed. There is no tunneling or undermining noted. There is a medium amount of serosanguineous drainage noted. The wound margin is flat and intact. There is large (67-100%) red granulation within the wound bed. There is no necrotic tissue within the wound  bed. The periwound skin appearance had no abnormalities noted for moisture. The periwound skin appearance exhibited: Scarring, Hemosiderin Staining. Periwound temperature was noted as No Abnormality. Wound #3 status is Open. Original cause of wound was Gradually Appeared. The date acquired was: 12/07/2021. The wound has been in treatment 68 weeks. The wound is located on the Right,Medial Lower Leg. The wound measures 7.5cm length x 5cm width x 0.3cm depth; 29.452cm^2 area and 8.836cm^3 volume. There is Fat Layer (Subcutaneous Tissue) exposed. There is no tunneling or undermining noted. There is a medium amount of serosanguineous drainage noted. The wound margin is flat and intact. There is small (1-33%) red granulation within the wound bed. There is a large (67-100%) amount of necrotic tissue within the wound bed including Eschar and Adherent Slough. The periwound skin appearance had no abnormalities noted for texture. The periwound skin appearance had no abnormalities noted for moisture. The periwound skin appearance exhibited: Hemosiderin Staining, Rubor. The periwound skin appearance did not exhibit: Erythema. Periwound temperature was noted as No Abnormality. The periwound has tenderness on palpation. Wound #6 status is Open. Original cause of wound was Gradually Appeared. The date acquired was: 05/12/2022. The wound has been in treatment 55 weeks. The wound is located on the Right,Anterior Lower Leg. The wound measures 0.3cm length x 0.3cm width x 0.2cm depth; 0.071cm^2 area and 0.014cm^3 volume. There is Fat Layer  (Subcutaneous Tissue) exposed. There is no tunneling or undermining noted. There is a small amount of serous drainage noted. The wound margin is epibole. There is small (1-33%) pink granulation within the wound bed. There is a large (67-100%) amount of necrotic tissue within the wound bed including Eschar. The periwound skin appearance had no abnormalities noted for texture. The periwound skin appearance had no abnormalities noted for moisture. The periwound skin appearance exhibited: Hemosiderin Staining. Periwound temperature was noted as No Abnormality. 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 There was a Excisional Skin/Subcutaneous Tissue Debridement with a total area of 5.89 sq cm performed by [name] Guess, MD. With the following instrument(s): Curette to remove Viable and Non-Viable tissue/material. Material removed includes Eschar, Subcutaneous Tissue, and Slough after achieving pain control using Lidocaine 4% T opical Solution. No specimens were taken. A time out was conducted at 11:50, prior to the start of the procedure. A Minimum amount of bleeding was controlled with Pressure. The procedure was tolerated well with a pain level of 0 throughout and a pain level of 0 following the procedure. Post Debridement Measurements: 7.5cm length x 5cm width x 0.3cm depth; 8.836cm^3 volume. Character of Wound/Ulcer Post Debridement is improved. Post procedure  Diagnosis Wound #3: Same as Pre-Procedure Pre-procedure diagnosis of Wound #3 is a Lymphedema located on the Right,Medial Lower Leg . There was a Three Layer Compression Therapy Procedure by [name] Deed, RN. 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 There was a Excisional Skin/Subcutaneous Tissue Debridement with a total area of 0.07 sq cm performed by [name] Guess, MD. With the following instrument(s): Curette to  remove Viable and Non-Viable tissue/material. Material removed includes Subcutaneous Tissue and Slough and after achieving pain control using Lidocaine 4% T opical Solution. No specimens were taken. A time out was conducted at 11:50, prior to the start of the procedure. A Minimum amount of bleeding was controlled with Pressure. The procedure was tolerated well with a pain level of 0 throughout and a pain level of 0 following the procedure. Post Debridement Measurements: 0.3cm length x 0.3cm width x 0.2cm depth; 0.014cm^3 volume. Character of Wound/Ulcer Post Debridement is improved. Post procedure Diagnosis Wound #6: Same as Pre-Procedure Plan Follow-up Appointments: Return Appointment in 1 week. - Dr. [name] RM 1 Anesthetic: (In clinic) Topical Lidocaine 4% applied to wound bed Bathing/ Shower/ Hygiene: May shower with protection but do not get wound dressing(s) wet. Protect dressing(s) with water repellant cover (for example, large plastic bag) or a cast cover and may then take shower. Lymphedema Treatment Plan - Exercise, Compression and Elevation: Exercise daily as tolerated. (Walking, ROM, Calf Pumps and T T oe aps) Compression Garments, Class II. Wear daily when out of bed. May remove at bedtime Compression Wraps as ordered Elevate legs 30 - 60 minutes at or above heart level at least 3 - 4 times daily as able/tolerated Avoid standing for long periods  and elevate leg(s) parallel to the floor when sitting Use Pneumatic Compression Device on leg(s) 2-3 times a day for 45-60 minutes The following medication(s) was prescribed: levofloxacin oral 750 mg tablet 1 tab PO q day x 30 days starting 06/03/2023 WOUND #3: - Lower Leg Wound Laterality: Right, Medial Cleanser: Soap and Water 1 x Per Week/30 Days Discharge Instructions: May shower and wash wound with dial antibacterial soap and water prior to dressing change. Cleanser: Wound Cleanser 1 x Per Week/30 Days Discharge Instructions: Cleanse the wound with wound cleanser prior to applying a clean dressing using gauze sponges, not tissue or cotton balls. Peri-Wound Care: Skin Prep 1 x Per Week/30 Days Discharge Instructions: cavelon to macerated wound margins Peri-Wound Care: Sween Lotion (Moisturizing lotion) 1 x Per Week/30 Days Discharge Instructions: Apply moisturizing lotion as directed Prim Dressing: Maxorb Extra Ag+ Alginate Dressing, 4x4.75 (in/in) 1 x Per Week/30 Days ary Discharge Instructions: Apply to excoriated weeping areas Secondary Dressing: ABD Pad, 8x10 1 x Per Week/30 Days Discharge Instructions: Apply over primary dressing as directed. Com pression Wrap: ThreePress (3 layer compression wrap) 1 x Per Week/30 Days Discharge Instructions: Apply three layer compression as directed. CARTRELL, FILARDI ([id number]) [id number] Page 15 of 17 Com pression Wrap: Unnaboot w/Calamine, 4x10 (in/yd) 1 x Per Week/30 Days Discharge Instructions: Apply Unnaboot on distal foot and top of lower leg WOUND #6: - Lower Leg Wound Laterality: Right, Anterior Cleanser: Soap and Water 1 x Per Week/30 Days Discharge Instructions: May shower and wash wound with dial antibacterial soap and water prior to dressing change. Cleanser: Wound Cleanser 1 x Per Week/30 Days Discharge Instructions: Cleanse the wound with wound cleanser prior to applying a clean dressing using gauze sponges,  not tissue or cotton balls. Peri-Wound Care: Sween Lotion (Moisturizing lotion) 1 x Per Week/30 Days Discharge Instructions: Apply moisturizing lotion as directed Prim Dressing: Maxorb Extra Ag+ Alginate Dressing, 2x2 (in/in) 1 x Per Week/30 Days ary Discharge Instructions: Apply to wound bed as instructed Secondary Dressing: ABD Pad, 8x10 1 x Per Week/30 Days Discharge Instructions: Apply over primary dressing as directed. Com pression Wrap: ThreePress (3 layer compression wrap) 1 x Per Week/30 Days Discharge Instructions: Apply three layer compression as  directed. 06/03/2023: The dorsal foot ulcer is healed. The other wounds actually are quite a bit smaller this week. There is less depth to the large medial leg ulcers. Edema control is excellent. I used a curette to debride slough and subcutaneous tissue from all of the open wound sites. We will continue silver alginate to the wounds with 3 layer compression. I have renewed his suppressive levofloxacin. Follow up in 1 week. Electronic Signature(s) Signed: 06/03/2023 12:05:16 PM By: [name] Guess MD FACS Entered By: [name] on 06/03/2023 12:05:16 -------------------------------------------------------------------------------- HxROS Details Patient Name: Date of Service: [name] 06/03/2023 11:00 A M Medical Record Number: [id number] Patient Account Number: [id number] Date of Birth/Sex: Treating RN: Mar 04, 1951 (72 y.o. M) Primary Care Provider: Nicoletta [name] Other Clinician: Referring Provider: Treating Provider/Extender: [name] in Treatment: 68 Information Obtained From Patient Chart Constitutional Symptoms (General Health) Medical History: Past Medical History Notes: obesity Eyes Medical History: Positive for: Cataracts - right eye removed Negative for: Glaucoma; Optic Neuritis Ear/Nose/Mouth/Throat Medical History: Negative for: Chronic sinus problems/congestion; Middle ear  problems Past Medical History Notes: hard of hearing Hematologic/Lymphatic Medical History: Positive for: Anemia - macrocytic; Lymphedema Respiratory Medical History: Past Medical History Notes: frozen diaphragm right JORMA, ROSSEY ([id number]) [id number] Page 16 of 17 Cardiovascular Medical History: Positive for: Congestive Heart Failure; Deep Vein Thrombosis - left leg; Hypertension; Peripheral Venous Disease Past Medical History Notes: hyperlipidemia Endocrine Medical History: Negative for: Type I Diabetes; Type II Diabetes Genitourinary Medical History: Negative for: End Stage Renal Disease Integumentary (Skin) Medical History: Negative for: History of Burn Musculoskeletal Medical History: Past Medical History Notes: myositis, lumbar DDD, spinal stenosis, cervical facet joint syndrome Oncologic Medical History: Negative for: Received Chemotherapy; Received Radiation Psychiatric Medical History: Negative for: Anorexia/bulimia; Confinement Anxiety HBO Extended History Items Eyes: Cataracts Immunizations Pneumococcal Vaccine: Received Pneumococcal Vaccination: Yes Received Pneumococcal Vaccination On or After 60th Birthday: Yes Implantable Devices No devices added Hospitalization / Surgery History Type of Hospitalization/Surgery cervical fusion lumbar surgery coronary stent placement lasik eye surgery hydrocele excision Family and Social History Cancer: Yes - Mother; Diabetes: No; Heart Disease: Yes - Father,Mother; Hereditary Spherocytosis: No; Hypertension: Yes - Father,Siblings; Kidney Disease: No; Lung Disease: Yes - Siblings; Seizures: No; Stroke: No; Thyroid Problems: No; Tuberculosis: No; Former smoker - quit 1991; Marital Status - Married; Alcohol Use: Moderate; Drug Use: No History; Caffeine Use: Daily - coffee; Financial Concerns: No; Food, Clothing or Shelter Needs: No; Support System Lacking: No; Transportation Concerns:  No Electronic Signature(s) Signed: 06/03/2023 12:31:22 PM By: [name] Guess MD FACS Entered By: [name] on 06/03/2023 12:00:53 [name] ([id number]) [id number] Page 17 of 17 -------------------------------------------------------------------------------- SuperBill Details Patient Name: Date of Service: NIXON, MISCHLER 06/03/2023 Medical Record Number: [id number] Patient Account Number: [id number] Date of Birth/Sex: Treating RN: 06-06-1951 (72 y.o. M) Primary Care Provider: Nicoletta [name] Other Clinician: Referring Provider: Treating Provider/Extender: [name] in Treatment: 68 Diagnosis Coding ICD-10 Codes Code Description [phone] 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 I25.10 Atherosclerotic heart disease of native coronary artery without angina pectoris I89.0 Lymphedema, not elsewhere classified I87.2 Venous insufficiency (chronic) (peripheral) Z86.718 Personal history of other venous thrombosis and embolism I10 Essential (primary) hypertension Facility Procedures : CPT4 Code: [id number] Description: 11042 - DEB SUBQ TISSUE 20 SQ CM/< ICD-10 Diagnosis Description L97.812 Non-pressure chronic ulcer of other part of right lower leg with fat layer exp L94.2 Calcinosis cutis Modifier: osed Quantity: 1 Physician  Procedures : CPT4 Code Description Modifier [id number] - WC PHYS LEVEL 4 - EST PT ICD-10 Diagnosis Description 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.0 Lymphedema, not elsewhere  classified I87.2 Venous insufficiency (chronic) (peripheral) Quantity: 1 : [id number] 11042 - WC PHYS SUBQ TISS 20 SQ CM ICD-10 Diagnosis Description 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 Quantity: 1 Electronic  Signature(s) Signed: 06/03/2023 12:07:14 PM By: [name] Guess MD FACS Entered By: [name] on 06/03/2023 12:07:14

## 2023-06-08 NOTE — Progress Notes (Signed)
[name] ([id number]) [id number] Page 1 of 11 Visit Report for 06/03/2023 Arrival Information Details Patient Name: Date of Service: [name], [name] 06/03/2023 11:00 A M Medical Record Number: [id number] Patient Account Number: [id number] Date of Birth/Sex: Treating RN: 07-15-1951 (72 y.o. M) Primary Care Sharina Petre: [name] Other Clinician: Referring Elanor Cale: Treating Royale Lennartz/Extender: [name] in Treatment: 31 Visit Information History Since Last Visit Added or deleted any medications: No Patient Arrived: Ambulatory Any new allergies or adverse reactions: No Arrival Time: 11:01 Had a fall or experienced change in No Accompanied By: self activities of daily living that may affect Transfer Assistance: None risk of falls: Patient Identification Verified: Yes Signs or symptoms of abuse/neglect since last visito No Secondary Verification Process Completed: Yes Hospitalized since last visit: No Patient Requires Transmission-Based Precautions: No Implantable device outside of the clinic excluding No Patient Has Alerts: Yes cellular tissue based products placed in the center Patient Alerts: R ABI= N/C, TBI= .75 since last visit: L ABI =N/C, TBI = .57 Has Dressing in Place as Prescribed: Yes Pain Present Now: No Electronic Signature(s) Signed: 06/08/2023 11:11:27 AM By: Karl Ito Entered By: Karl Ito on 06/03/2023 08:05:30 -------------------------------------------------------------------------------- Compression Therapy Details Patient Name: Date of Service: [name] 06/03/2023 11:00 A M Medical Record Number: [id number] Patient Account Number: [id number] Date of Birth/Sex: Treating RN: 05-Jun-1951 (72 y.o. [name] Primary Care Zarah Carbon: [name] Other Clinician: Referring Sonya Gunnoe: Treating Twila Rappa/Extender: [name] in Treatment: 68 Compression  Therapy Performed for Wound Assessment: Wound #3 Right,Medial Lower Leg Performed By: Clinician Zenaida Deed, RN Compression Type: Three Layer Post Procedure Diagnosis Same as Pre-procedure Electronic Signature(s) Signed: 06/03/2023 1:08:45 PM By: Zenaida Deed RN, BSN Entered By: Zenaida Deed on 06/03/2023 08:55:04 [name] ([id number]) [id number] Page 2 of 11 -------------------------------------------------------------------------------- Encounter Discharge Information Details Patient Name: Date of Service: [name] 06/03/2023 11:00 A M Medical Record Number: [id number] Patient Account Number: [id number] Date of Birth/Sex: Treating RN: 1951/01/02 (72 y.o. [name] Primary Care Elaiza Shoberg: [name] Other Clinician: Referring Theodosia Bahena: Treating Keidra Withers/Extender: [name] in Treatment: 39 Encounter Discharge Information Items Post Procedure Vitals Discharge Condition: Stable Temperature (F): 98.9 Ambulatory Status: Wheelchair Pulse (bpm): 66 Discharge Destination: Home Respiratory Rate (breaths/min): 18 Transportation: Private Auto Blood Pressure (mmHg): 126/65 Accompanied By: self Schedule Follow-up Appointment: Yes Clinical Summary of Care: Patient Declined Electronic Signature(s) Signed: 06/03/2023 12:10:28 PM By: [name] Entered By: [name] on 06/03/2023 09:10:12 -------------------------------------------------------------------------------- Lower Extremity Assessment Details Patient Name: Date of Service: [name] 06/03/2023 11:00 A M Medical Record Number: [id number] Patient Account Number: [id number] Date of Birth/Sex: Treating RN: 08-Mar-1951 (72 y.o. [name] Primary Care Dyrell Tuccillo: [name] Other Clinician: Referring Eugine Bubb: Treating Magnum Lunde/Extender: [name] in Treatment: 68 Edema  Assessment Assessed: Kyra Searles: No] [Right: No] Edema: [Left: Yes] [Right: Yes] Calf Left: Right: Point of Measurement: From Medial Instep 40.5 cm 46.2 cm Ankle Left: Right: Point of Measurement: From Medial Instep 25.5 cm 26.5 cm Vascular Assessment Pulses: Dorsalis Pedis Palpable: [Right:Yes] Extremity colors, hair growth, and conditions: Extremity Color: [Left:Hyperpigmented] [Right:Hyperpigmented] Hair Growth on Extremity: [Left:Yes] [Right:Yes] Temperature of Extremity: [Left:Warm] [Right:Warm] Capillary Refill: [Left:< 3 seconds] [Right:< 3 seconds] Dependent Rubor: [Left:No No] [Right:No No] Electronic Signature(s) Signed: 06/03/2023 12:21:02 PM By: Karie Schwalbe RN Entered By: Karie Schwalbe on 06/03/2023 08:39:57 [name] ([id number]) [id number] Page 3 of 11 -------------------------------------------------------------------------------- Multi Wound Chart Details Patient Name: Date of Service: [name],  [name] 06/03/2023 11:00 A M Medical Record Number: [id number] Patient Account Number: [id number] Date of Birth/Sex: Treating RN: 11/01/1950 (72 y.o. M) Primary Care Deepti Gunawan: [name] Other Clinician: Referring Edan Serratore: Treating Acadia Thammavong/Extender: [name] in Treatment: 66 Vital Signs Height(in): 71 Pulse(bpm): 66 Weight(lbs): 267 Blood Pressure(mmHg): 126/65 Body Mass Index(BMI): 37.2 Temperature(F): 98.9 Respiratory Rate(breaths/min): 18 [11:Photos:] Right, Dorsal Foot Right, Medial Lower Leg Right, Anterior Lower Leg Wound Location: Gradually Appeared Gradually Appeared Gradually Appeared Wounding Event: Lymphedema Lymphedema Calcinosis Primary Etiology: Cataracts, Anemia, Lymphedema, Cataracts, Anemia, Lymphedema, Cataracts, Anemia, Lymphedema, Comorbid History: Congestive Heart Failure, Deep Vein Congestive Heart Failure, Deep Vein Congestive Heart Failure, Deep Vein Thrombosis, Hypertension,  Peripheral Thrombosis, Hypertension, Peripheral Thrombosis, Hypertension, Peripheral Venous Disease Venous Disease Venous Disease 05/27/2023 12/07/2021 05/12/2022 Date Acquired: 1 68 55 Weeks of Treatment: Healed - Epithelialized Open Open Wound Status: No No No Wound Recurrence: No No Yes Clustered Wound: N/A N/A 2 Clustered Quantity: 0x0x0 7.5x5x0.3 0.3x0.3x0.2 Measurements L x W x D (cm) 0 29.452 0.071 A (cm) : rea 0 8.836 0.014 Volume (cm) : 100.00% -451.40% 69.90% % Reduction in A rea: 100.00% -313.70% 41.70% % Reduction in Volume: Full Thickness Without Exposed Full Thickness With Exposed Support Full Thickness Without Exposed Classification: Support Structures Structures Support Structures Medium Medium Small Exudate A mount: Serosanguineous Serosanguineous Serous Exudate Type: red, brown red, brown amber Exudate Color: Flat and Intact Flat and Intact Epibole Wound Margin: Large (67-100%) Small (1-33%) Small (1-33%) Granulation A mount: Red Red Pink Granulation Quality: None Present (0%) Large (67-100%) Large (67-100%) Necrotic A mount: N/A Eschar, Adherent Slough Eschar Necrotic Tissue: Fat Layer (Subcutaneous Tissue): Yes Fat Layer (Subcutaneous Tissue): Yes Fat Layer (Subcutaneous Tissue): Yes Exposed Structures: Fascia: No Fascia: No Fascia: No Tendon: No Tendon: No Tendon: No Muscle: No Muscle: No Muscle: No Joint: No Joint: No Joint: No Bone: No Bone: No Bone: No Small (1-33%) Medium (34-66%) Large (67-100%) Epithelialization: N/A Debridement - Excisional Debridement - Excisional Debridement: Pre-procedure Verification/Time Out N/A 11:50 11:50 Taken: N/A Lidocaine 4% Topical Solution Lidocaine 4% Topical Solution Pain Control: N/A Necrotic/Eschar, Subcutaneous, Subcutaneous, Slough Tissue Debrided: Slough N/A Skin/Subcutaneous Tissue Skin/Subcutaneous Tissue Level: N/A 5.89 0.07 Debridement A (sq cm): rea N/A Curette  Curette Instrument: N/A Minimum Minimum Bleeding: N/A Pressure Pressure Hemostasis Achieved: N/A 0 0 Procedural PainRAYFORD, PODGORNY ([id number]) [id number] Page 4 of 11 N/A 0 0 Post Procedural Pain: N/A Procedure was tolerated well Procedure was tolerated well Debridement Treatment Response: N/A 7.5x5x0.3 0.3x0.3x0.2 Post Debridement Measurements L x W x D (cm) N/A 8.836 0.014 Post Debridement Volume: (cm) Scarring: Yes Excoriation: No No Abnormalities Noted Periwound Skin Texture: No Abnormalities Noted Maceration: Yes No Abnormalities Noted Periwound Skin Moisture: Hemosiderin Staining: Yes Hemosiderin Staining: Yes Hemosiderin Staining: Yes Periwound Skin Color: Rubor: Yes Erythema: No No Abnormality No Abnormality No Abnormality Temperature: N/A Yes N/A Tenderness on Palpation: N/A Compression Therapy Debridement Procedures Performed: Debridement Treatment Notes Electronic Signature(s) Signed: 06/03/2023 11:59:13 AM By: Duanne Guess MD FACS Entered By: Duanne Guess on 06/03/2023 08:59:13 -------------------------------------------------------------------------------- Multi-Disciplinary Care Plan Details Patient Name: Date of Service: [name] 06/03/2023 11:00 A M Medical Record Number: [id number] Patient Account Number: [id number] Date of Birth/Sex: Treating RN: 02/09/1951 (72 y.o. [name] Primary Care Kasch Borquez: [name] Other Clinician: Referring Keturah Yerby: Treating Gemayel Mascio/Extender: [name] in Treatment: 73 Multidisciplinary Care Plan reviewed with physician Active Inactive Venous Leg Ulcer Nursing Diagnoses: Knowledge deficit related to disease process and management Potential for  venous Insuffiency (use before diagnosis confirmed) Goals: Patient will maintain optimal edema control Date Initiated: 02/17/2022 Target Resolution Date: 06/24/2023 Goal Status:  Active Interventions: Assess peripheral edema status every visit. Compression as ordered Provide education on venous insufficiency Treatment Activities: Non-invasive vascular studies : 02/17/2022 Therapeutic compression applied : 02/17/2022 Notes: Wound/Skin Impairment Nursing Diagnoses: Impaired tissue integrity Knowledge deficit related to ulceration/compromised skin integrity Goals: Patient/caregiver will verbalize understanding of skin care regimen Date Initiated: 02/17/2022 Target Resolution Date: 06/24/2023 Goal Status: Active Ulcer/skin breakdown will have a volume reduction of 30% by week 4 Shishido, Khary ([id number]) [id number] Page 5 of 11 Date Initiated: 02/17/2022 Date Inactivated: 03/10/2022 Target Resolution Date: 03/10/2022 Unmet Reason: infection being treated, Goal Status: Unmet waiting on derm appte Ulcer/skin breakdown will have a volume reduction of 50% by week 8 Date Initiated: 03/10/2022 Date Inactivated: 04/14/2022 Target Resolution Date: 04/07/2022 Goal Status: Unmet Unmet Reason: calcinosis Ulcer/skin breakdown will have a volume reduction of 80% by week 12 Date Initiated: 04/14/2022 Date Inactivated: 05/05/2022 Target Resolution Date: 05/05/2022 Unmet Reason: infection, chronic Goal Status: Unmet condition Interventions: Assess patient/caregiver ability to obtain necessary supplies Assess patient/caregiver ability to perform ulcer/skin care regimen upon admission and as needed Assess ulceration(s) every visit Provide education on ulcer and skin care Treatment Activities: Skin care regimen initiated : 02/17/2022 Topical wound management initiated : 02/17/2022 Notes: Electronic Signature(s) Signed: 06/03/2023 1:08:45 PM By: Zenaida Deed RN, BSN Entered By: Zenaida Deed on 06/03/2023 07:59:49 -------------------------------------------------------------------------------- Pain Assessment Details Patient Name: Date of  Service: [name] 06/03/2023 11:00 A M Medical Record Number: [id number] Patient Account Number: [id number] Date of Birth/Sex: Treating RN: 04/15/1951 (72 y.o. M) Primary Care Alvar Malinoski: [name] Other Clinician: Referring Adolfo Granieri: Treating Kalyssa Anker/Extender: [name] in Treatment: 22 Active Problems Location of Pain Severity and Description of Pain Patient Has Paino No Site Locations Pain Management and Medication Current Pain Management: Electronic Signature(s) Signed: 06/08/2023 11:11:27 AM By: Karl Ito Entered By: Karl Ito on 06/03/2023 08:05:59 [name] ([id number]) [id number] Page 6 of 11 -------------------------------------------------------------------------------- Patient/Caregiver Education Details Patient Name: Date of Service: ORIEN, KURTYKA 11/8/2024andnbsp11:00 A M Medical Record Number: [id number] Patient Account Number: [id number] Date of Birth/Gender: Treating RN: Jan 09, 1951 (72 y.o. [name] Primary Care Physician: [name] Other Clinician: Referring Physician: Treating Physician/Extender: [name] in Treatment: 54 Education Assessment Education Provided To: Patient Education Topics Provided Venous: Methods: Explain/Verbal Responses: Reinforcements needed, State content correctly Wound/Skin Impairment: Methods: Explain/Verbal Responses: Reinforcements needed, State content correctly Electronic Signature(s) Signed: 06/03/2023 1:08:45 PM By: Zenaida Deed RN, BSN Entered By: Zenaida Deed on 06/03/2023 08:00:11 -------------------------------------------------------------------------------- Wound Assessment Details Patient Name: Date of Service: DYRON, DOTZLER [name] 06/03/2023 11:00 A M Medical Record Number: [id number] Patient Account Number: [id number] Date of Birth/Sex: Treating RN: October 22, 1950 (72 y.o. [name] Primary Care Polette Nofsinger: [name] Other Clinician: Referring Avonne Berkery: Treating Jesselyn Rask/Extender: [name] in Treatment: 68 Wound Status Wound Number: 11 Primary Lymphedema Etiology: Wound Location: Right, Dorsal Foot Wound Healed - Epithelialized Wounding Event: Gradually Appeared Status: Date Acquired: 05/27/2023 Comorbid Cataracts, Anemia, Lymphedema, Congestive Heart Failure, Deep Weeks Of Treatment: 1 History: Vein Thrombosis, Hypertension, Peripheral Venous Disease Clustered Wound: No Photos TARVARIS, SLY ([id number]) [id number] Page 7 of 11 Wound Measurements Length: (cm) Width: (cm) Depth: (cm) Area: (cm) Volume: (cm) 0 % Reduction in Area: 100% 0 % Reduction in Volume: 100% 0 Epithelialization: Small (1-33%) 0 Tunneling: No 0 Undermining: No Wound Description Classification: Full Thickness Without  Exposed Suppor Wound Margin: Flat and Intact Exudate Amount: Medium Exudate Type: Serosanguineous Exudate Color: red, brown t Structures Foul Odor After Cleansing: No Slough/Fibrino No Wound Bed Granulation Amount: Large (67-100%) Exposed Structure Granulation Quality: Red Fascia Exposed: No Necrotic Amount: None Present (0%) Fat Layer (Subcutaneous Tissue) Exposed: Yes Tendon Exposed: No Muscle Exposed: No Joint Exposed: No Bone Exposed: No Periwound Skin Texture Texture Color No Abnormalities Noted: No No Abnormalities Noted: No Scarring: Yes Hemosiderin Staining: Yes Moisture Temperature / Pain No Abnormalities Noted: Yes Temperature: No Abnormality Treatment Notes Wound #11 (Foot) Wound Laterality: Dorsal, Right Cleanser Peri-Wound Care Topical Primary Dressing Secondary Dressing Secured With Compression Wrap Compression Stockings Add-Ons Electronic Signature(s) Signed: 06/03/2023 1:08:45 PM By: Zenaida Deed RN, BSN Entered By: Zenaida Deed on 06/03/2023 08:55:40 [name] ([id number]) [id number] Page 8 of 11 -------------------------------------------------------------------------------- Wound Assessment Details Patient Name: Date of Service: OATIS, KINKADE 06/03/2023 11:00 A M Medical Record Number: [id number] Patient Account Number: [id number] Date of Birth/Sex: Treating RN: [birth year] (72 y.o. M) Primary Care Jvon Meroney: [name] Other Clinician: Referring Tandi Hanko: Treating Aileana Hodder/Extender: [name] in Treatment: 68 Wound Status Wound Number: 3 Primary Lymphedema Etiology: Wound Location: Right, Medial Lower Leg Wound Open Wounding Event: Gradually Appeared Status: Date Acquired: 12/07/2021 Comorbid Cataracts, Anemia, Lymphedema, Congestive Heart Failure, Deep Weeks Of Treatment: 68 History: Vein Thrombosis, Hypertension, Peripheral Venous Disease Clustered Wound: No Photos Wound Measurements Length: (cm) 7.5 Width: (cm) 5 Depth: (cm) 0.3 Area: (cm) 29.452 Volume: (cm) 8.836 % Reduction in Area: -451.4% % Reduction in Volume: -313.7% Epithelialization: Medium (34-66%) Tunneling: No Undermining: No Wound Description Classification: Full Thickness With Exposed Suppo Wound Margin: Flat and Intact Exudate Amount: Medium Exudate Type: Serosanguineous Exudate Color: red, brown rt Structures Foul Odor After Cleansing: No Slough/Fibrino Yes Wound Bed Granulation Amount: Small (1-33%) Exposed Structure Granulation Quality: Red Fascia Exposed: No Necrotic Amount: Large (67-100%) Fat Layer (Subcutaneous Tissue) Exposed: Yes Necrotic Quality: Eschar, Adherent Slough Tendon Exposed: No Muscle Exposed: No Joint Exposed: No Bone Exposed: No Periwound Skin Texture Texture Color No Abnormalities Noted: Yes No Abnormalities Noted: No Erythema: No Moisture Hemosiderin Staining: Yes No Abnormalities Noted: Yes Rubor: Yes Temperature / Pain Temperature: No  Abnormality Tenderness on Palpation: Yes Treatment Notes Wound #3 (Lower Leg) Wound Laterality: Right, Medial Cleanser Soap and Water Discharge Instruction: May shower and wash wound with dial antibacterial soap and water prior to dressing change. Wound Cleanser BRAEDYN, PETREA ([id number]) [id number] Page 9 of 11 Discharge Instruction: Cleanse the wound with wound cleanser prior to applying a clean dressing using gauze sponges, not tissue or cotton balls. Peri-Wound Care Skin Prep Discharge Instruction: cavelon to macerated wound margins Sween Lotion (Moisturizing lotion) Discharge Instruction: Apply moisturizing lotion as directed Topical Primary Dressing Maxorb Extra Ag+ Alginate Dressing, 4x4.75 (in/in) Discharge Instruction: Apply to excoriated weeping areas Secondary Dressing ABD Pad, 8x10 Discharge Instruction: Apply over primary dressing as directed. Secured With Compression Wrap ThreePress (3 layer compression wrap) Discharge Instruction: Apply three layer compression as directed. Unnaboot w/Calamine, 4x10 (in/yd) Discharge Instruction: Apply Unnaboot on distal foot and top of lower leg Compression Stockings Add-Ons Electronic Signature(s) Signed: 06/03/2023 12:21:02 PM By: Karie Schwalbe RN Entered By: Karie Schwalbe on 06/03/2023 08:39:11 -------------------------------------------------------------------------------- Wound Assessment Details Patient Name: Date of Service: TERRAN, LAWRIE 06/03/2023 11:00 A M Medical Record Number: [id number] Patient Account Number: [id number] Date of Birth/Sex: Treating RN: Mar 09, 1951 (72 y.o. M) Primary Care Jessi Pitstick: [name] Other Clinician: Referring Emiel Kielty: Treating Merridy Pascoe/Extender: Armando Reichert,  Quincy Simmonds in Treatment: 68 Wound Status Wound Number: 6 Primary Calcinosis Etiology: Wound Location: Right, Anterior Lower Leg Wound Open Wounding Event: Gradually  Appeared Status: Date Acquired: 05/12/2022 Comorbid Cataracts, Anemia, Lymphedema, Congestive Heart Failure, Deep Weeks Of Treatment: 55 History: Vein Thrombosis, Hypertension, Peripheral Venous Disease Clustered Wound: Yes Photos Wound Measurements Ow, Amado ([id number]) Length: (cm) 0.3 Width: (cm) 0.3 Depth: (cm) 0.2 Clustered Quantity: 2 Area: (cm) 0.071 Volume: (cm) 0.014 [id number] Page 10 of 11 % Reduction in Area: 69.9% % Reduction in Volume: 41.7% Epithelialization: Large (67-100%) Tunneling: No Undermining: No Wound Description Classification: Full Thickness Without Exposed Sup Wound Margin: Epibole Exudate Amount: Small Exudate Type: Serous Exudate Color: amber port Structures Foul Odor After Cleansing: No Slough/Fibrino No Wound Bed Granulation Amount: Small (1-33%) Exposed Structure Granulation Quality: Pink Fascia Exposed: No Necrotic Amount: Large (67-100%) Fat Layer (Subcutaneous Tissue) Exposed: Yes Necrotic Quality: Eschar Tendon Exposed: No Muscle Exposed: No Joint Exposed: No Bone Exposed: No Periwound Skin Texture Texture Color No Abnormalities Noted: Yes No Abnormalities Noted: No Hemosiderin Staining: Yes Moisture No Abnormalities Noted: Yes Temperature / Pain Temperature: No Abnormality Treatment Notes Wound #6 (Lower Leg) Wound Laterality: Right, Anterior Cleanser Soap and Water Discharge Instruction: May shower and wash wound with dial antibacterial soap and water prior to dressing change. Wound Cleanser Discharge Instruction: Cleanse the wound with wound cleanser prior to applying a clean dressing using gauze sponges, not tissue or cotton balls. Peri-Wound Care Sween Lotion (Moisturizing lotion) Discharge Instruction: Apply moisturizing lotion as directed Topical Primary Dressing Maxorb Extra Ag+ Alginate Dressing, 2x2 (in/in) Discharge Instruction: Apply to wound bed as instructed Secondary  Dressing ABD Pad, 8x10 Discharge Instruction: Apply over primary dressing as directed. Secured With Compression Wrap ThreePress (3 layer compression wrap) Discharge Instruction: Apply three layer compression as directed. Compression Stockings Add-Ons Electronic Signature(s) Signed: 06/03/2023 12:21:02 PM By: Karie Schwalbe RN Entered By: Karie Schwalbe on 06/03/2023 29:51:88 [name] ([id number]) [id number] Page 11 of 11 -------------------------------------------------------------------------------- Vitals Details Patient Name: Date of Service: ABBOT, BALLOG 06/03/2023 11:00 A M Medical Record Number: [id number] Patient Account Number: [id number] Date of Birth/Sex: Treating RN: 01/21/1951 (72 y.o. M) Primary Care Shykeem Resurreccion: [name] Other Clinician: Referring Brittain Smithey: Treating Sueko Dimichele/Extender: [name] in Treatment: 68 Vital Signs Time Taken: 11:05 Temperature (F): 98.9 Height (in): 71 Pulse (bpm): 66 Weight (lbs): 267 Respiratory Rate (breaths/min): 18 Body Mass Index (BMI): 37.2 Blood Pressure (mmHg): 126/65 Reference Range: 80 - 120 mg / dl Electronic Signature(s) Signed: 06/08/2023 11:11:27 AM By: Karl Ito Entered By: Karl Ito on 06/03/2023 08:05:53

## 2023-06-10 ENCOUNTER — Encounter (HOSPITAL_BASED_OUTPATIENT_CLINIC_OR_DEPARTMENT_OTHER): Payer: Medicare Other | Admitting: General Surgery

## 2023-06-10 DIAGNOSIS — I110 Hypertensive heart disease with heart failure: Secondary | ICD-10-CM | POA: Diagnosis not present

## 2023-06-10 DIAGNOSIS — I890 Lymphedema, not elsewhere classified: Secondary | ICD-10-CM | POA: Diagnosis not present

## 2023-06-10 DIAGNOSIS — L97822 Non-pressure chronic ulcer of other part of left lower leg with fat layer exposed: Secondary | ICD-10-CM | POA: Diagnosis not present

## 2023-06-10 DIAGNOSIS — L942 Calcinosis cutis: Secondary | ICD-10-CM | POA: Diagnosis not present

## 2023-06-10 DIAGNOSIS — L97512 Non-pressure chronic ulcer of other part of right foot with fat layer exposed: Secondary | ICD-10-CM | POA: Diagnosis not present

## 2023-06-10 DIAGNOSIS — I509 Heart failure, unspecified: Secondary | ICD-10-CM | POA: Diagnosis not present

## 2023-06-10 DIAGNOSIS — L97812 Non-pressure chronic ulcer of other part of right lower leg with fat layer exposed: Secondary | ICD-10-CM | POA: Diagnosis not present

## 2023-06-10 NOTE — Progress Notes (Signed)
MARKEL, WACHHOLZ ([id number]) [id number] Page 1 of 18 Visit Report for 06/10/2023 Chief Complaint Document Details Patient Name: Date of Service: [name], MONOPOLI 06/10/2023 11:00 A M Medical Record Number: [id number] Patient Account Number: [id number] Date of Birth/Sex: Treating RN: 08-15-1950 (72 y.o. M) Primary Care Provider: [name] Other Clinician: Referring Provider: Treating Provider/Extender: [name] in Treatment: 69 Information Obtained from: Patient Chief Complaint Patient seen for complaints of Non-Healing Wounds. Electronic Signature(s) Signed: 06/10/2023 12:00:28 PM By: Duanne Guess MD FACS Entered By: Duanne Guess on 06/10/2023 09:00:28 -------------------------------------------------------------------------------- Debridement Details Patient Name: Date of Service: [name] 06/10/2023 11:00 A M Medical Record Number: [id number] Patient Account Number: [id number] Date of Birth/Sex: Treating RN: 12/15/1950 (72 y.o. [name] Primary Care Provider: [name] Other Clinician: Referring Provider: Treating Provider/Extender: [name] in Treatment: 69 Debridement Performed for Assessment: Wound #12 Left,Anterior Lower Leg Performed By: Physician Duanne Guess, MD The following information was scribed by: [name] The information was scribed for: Duanne Guess Debridement Type: Debridement Severity of Tissue Pre Debridement: Fat layer exposed Level of Consciousness (Pre-procedure): Awake and Alert Pre-procedure Verification/Time Out Yes - 11:48 Taken: Start Time: 11:48 Pain Control: Lidocaine 4% T opical Solution Percent of Wound Bed Debrided: 100% T Area Debrided (cm): otal 2.83 Tissue and other material debrided: Non-Viable, Slough, Subcutaneous, Slough Level: Skin/Subcutaneous Tissue Debridement Description:  Excisional Instrument: Curette Bleeding: Minimum Hemostasis Achieved: Pressure Response to Treatment: Procedure was tolerated well Level of Consciousness (Post- Awake and Alert procedure): Post Debridement Measurements of Total Wound Length: (cm) 1.8 Width: (cm) 2 Depth: (cm) 0.1 Volume: (cm) 0.283 Character of Wound/Ulcer Post Debridement: Improved Severity of Tissue Post Debridement: Fat layer exposed [name] ([id number]) [id number] Page 2 of 18 Post Procedure Diagnosis Same as Pre-procedure Electronic Signature(s) Signed: 06/10/2023 11:57:20 AM By: [name] Signed: 06/10/2023 12:51:55 PM By: Duanne Guess MD FACS Entered By: [name] on 06/10/2023 08:49:28 -------------------------------------------------------------------------------- Debridement Details Patient Name: Date of Service: [name] 06/10/2023 11:00 A M Medical Record Number: [id number] Patient Account Number: [id number] Date of Birth/Sex: Treating RN: [birth year] (72 y.o. [name] Primary Care Provider: [name] Other Clinician: Referring Provider: Treating Provider/Extender: [name] in Treatment: 69 Debridement Performed for Assessment: Wound #3 Right,Medial Lower Leg Performed By: Physician Duanne Guess, MD The following information was scribed by: [name] The information was scribed for: Duanne Guess Debridement Type: Debridement Level of Consciousness (Pre-procedure): Awake and Alert Pre-procedure Verification/Time Out Yes - 11:48 Taken: Start Time: 11:48 Pain Control: Lidocaine 4% T opical Solution Percent of Wound Bed Debrided: 100% T Area Debrided (cm): otal 11.3 Tissue and other material debrided: Non-Viable, Slough, Subcutaneous, Slough Level: Skin/Subcutaneous Tissue Debridement Description: Excisional Instrument: Curette Bleeding: Minimum Hemostasis Achieved:  Pressure Response to Treatment: Procedure was tolerated well Level of Consciousness (Post- Awake and Alert procedure): Post Debridement Measurements of Total Wound Length: (cm) 3 Width: (cm) 4.8 Depth: (cm) 0.3 Volume: (cm) 3.393 Character of Wound/Ulcer Post Debridement: Improved Post Procedure Diagnosis Same as Pre-procedure Electronic Signature(s) Signed: 06/10/2023 11:57:20 AM By: [name] Signed: 06/10/2023 12:51:55 PM By: Duanne Guess MD FACS Entered By: [name] on 06/10/2023 08:51:52 -------------------------------------------------------------------------------- HPI Details Patient Name: Date of Service: [name] 06/10/2023 11:00 A M Medical Record Number: [id number] Patient Account Number: [id number] CAGNEY, DEBORDE ([id number]) [id number] Page 3 of 18 Date of Birth/Sex: Treating RN: [birth year] (72 y.o. M) Primary Care Provider: Other Clinician: [name] Referring Provider: Treating Provider/Extender:  Duanne Guess Santo Held in Treatment: 69 History of Present Illness HPI Description: ADMISSION 02/09/2022 This is a 72 year old man who recently moved to [place] from [place]. He has a history of of coronary artery disease and prior left popliteal vein DVT . He is not diabetic and does not smoke. 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 He has had multiple spinal surgeries and has limited mobility. He primarily uses a motorized wheelchair. He has had lymphedema for many years and does have lymphedema pumps although he says he does not use them frequently. He has wounds on his bilateral lower extremities. He was receiving care in [place] for these prior to his move. They were apparently packing his wounds with Betadine soaked gauze. He has worn compression wraps in the past but not recently. He did say that they helped tremendously. In clinic today, his right ABI is  noncompressible but has good signals; the left ABI was 1.17. No formal vascular studies have been performed. On his left dorsal foot, there is a circular lesion that exposes the fat layer. There is fairly heavy slough accumulation within the wound, but no significant odor. On his right medial calf, he has multiple pinholes that have slough buildup in them and probe for about 2 to 4 mm in depth. They are draining clear fluid. On his right lateral midfoot, he has ulceration consistent with venous stasis. It is geographic and has slough accumulation. 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 02/17/2022: All of the wounds are little bit larger today and have more slough accumulation. Today, the medial calf openings are larger and probe to something that feels calcified. There is odor coming from his wounds. His vascular studies are scheduled for August 9 and 10. 02-24-2022 upon evaluation today patient presents for follow-up concerning his ongoing issues here with his lower extremities. With that being said he does have significant problems with what appears to be cellulitis unfortunately the PCR culture that was sent last week got crushed by [employer] in transit. With that being said we are going to reobtain a culture today although I am actually to do it on the right medial leg as this seems to be the most inflamed area today where there is some questionable drainage as well. I am going to remove some of the calcium deposits which are loosening obtain a deeper culture from this area. Fortunately I do not see any evidence of active infection systemically which is good news but locally I think we are having a bigger issue here. He does have his appointment next Thursday with vascular. Patient has also been referred to [facility] for further evaluation and treatment of the calcinosis for possible sulfate  injections. 03/04/2022: The culture that was performed last week grew out multiple species including Klebsiella, Morganella, and Pseudomonas aeruginosa. He had been prescribed Bactrim but this was not adequate to cover all of the species and levofloxacin was recommended. He completed the Bactrim but did not pick up the levofloxacin and begin taking it until yesterday. 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 He had arterial studies performed today. He does have evidence of peripheral arterial disease. Results copied here: +-------+-----------+-----------+------------+------------+ ABI/TBIT oday's ABIT oday's TBIPrevious ABIPrevious TBI +-------+-----------+-----------+------------+------------+ Right [place] 0.75    +-------+-----------+-----------+------------+------------+  Left [place] 0.57    +-------+-----------+-----------+------------+------------+ Arterial wall calcification precludes accurate ankle pressures and ABIs. Summary: Right: Resting right ankle-brachial index indicates noncompressible right lower extremity arteries. The right toe-brachial index is normal. Left: Resting left ankle-brachial index indicates noncompressible left lower extremity arteries. The left toe-brachial index is abnormal. He continues to have further tissue breakdown at the right medial calf and there is ample slough accumulation along with necrotic subcutaneous tissue. The left dorsal foot wound has built up slough, as well. The right medial foot wound is nearly closed with just a light layer of eschar over the surface. The right dorsal lateral foot wound has also accumulated a fair amount of slough and remains quite tender. 03/10/2022: He has been taking the prescribed levofloxacin and has about 3 days left of this. The erythema and  induration on his right leg has improved. He continues to experience further breakdown of the right medial calf wound. There is extensive slough and debris accumulation there. There is heavy slough on his left dorsal foot wound, but no odor or purulent drainage. The right medial foot wound appears to be closed. The right dorsal lateral foot wound also has a fair amount of slough but it is less tender than at our last visit. He still does not have an appointment with dermatology. 03/22/2022: The right anterior tibial wound has closed. The right lateral foot wound is nearly closed with just a little bit of slough. The left dorsal foot wound is smaller and continues to have some slough buildup but less so than at prior visits. There is good granulation tissue underlying the slough. Unfortunately the right medial calf wound continues to deteriorate. It has a foul odor today and a lot of necrotic tissue, the surrounding skin is also erythematous and moist. 03/30/2022: The right lateral foot wound continues to contract and just has a little bit of slough and eschar present. The left dorsal foot wound is also smaller with slough overlying good granulation tissue. The right medial calf wound actually looks quite a bit better today; there is no odor and there is much less necrotic tissue although some remains present. We have been using topical gentamicin and he has been taking oral Augmentin. 04/07/2022: The patient saw dermatology at [facility] last week. Unfortunately, it appears that they did not read any of the documentation that was provided. 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 Overall, it was a highly unsatisfactory consultation. In  the meantime, however, we were able to identify compounding pharmacy who could create a topical sodium thiosulfate ointment. The patient has that with him today. Both of the right foot wounds are now closed. The left dorsal foot wound has contracted but still has a layer of slough on the surface. The medial calf wounds on the right all look a little bit better. They still have heavy slough along with the chunks of calcium. Everything is stained purple. 04/14/2022: The wound on his dorsal left foot is a little bit smaller again today. There is still slough accumulation on the surface. The medial calf wounds have quite a bit less slough accumulation today. His right leg, however, is warm and erythematous, concerning for cellulitis. 04/14/2022: He completed his course of Augmentin yesterday. The medial calf wound  sites are much cleaner today and shallower. There is less fragmented calcium in the wounds. He has a lot of lymphedema fluid-like drainage from these wounds, but no purulent discharge or malodor. The wound on his dorsal left ALTARIQ, SALAZ ([id number]) [id number] Page 4 of 18 foot has also contracted and is shallower. There is a layer of slough over healthy granulation tissue. 05/05/2022: The left dorsal foot wound is smaller today with less slough and more granulation tissue. The right medial calf wounds are shallower, but have extensive slough and some fat necrosis. The drainage has diminished considerably, however. 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 05/12/2022: The left dorsal foot wound continues to contract and fill with granulation tissue. There is slough on the wound surface. The right medial calf wounds also continue to fill with healthy tissue, but there is remaining slough and fat necrosis present. He had a significant increase in his  drainage this week and it was a bright yellow-green color. He also had a new wound open over his right anterior tibial surface associated with the spike of cutaneous calcium. The leg is more red, as well, concerning for infection. 05/19/2022: His culture returned positive for Pseudomonas. He is currently taking a course of oral levofloxacin. We have also been using topical gentamicin mixed in with his sodium thiosulfate. All of the wounds look better this week. 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 There is slough and nonviable subcutaneous tissue still present. The new anterior tibial wound just has a light layer of slough. The dorsal foot wound also has some slough present and appears a little dry today. 05/26/2022: The right medial leg wound continues to improve. It is feeling with granulation tissue, but still accumulates a fairly thick layer of slough on the surface. The more recent anterior tibial wound has remained quite small, but also has some slough on the surface. The left dorsal foot wound has contracted somewhat and has perimeter epithelialization. He completed his oral levofloxacin. 06/02/2022: The left dorsal foot wound continues to improve significantly. There is just a little slough on the wound surface. The right medial leg wound had black drainage, but it looks like silver alginate was applied last week and I theorized that silver sulfide was created with a combination of the silver alginate and the sodium thiosulfate. It did, however, have a bit of an odor to it and extensive slough accumulation. The small anterior tibial wound also had a bit of slough and nonviable subcutaneous tissue present. The skin of his leg was rather macerated, as well. 06/09/2022: The left dorsal foot wound is smaller again this week with just a light layer of slough on the surface. The right medial leg wound looks substantially better. The leg and periwound skin are no  longer red and macerated. There is good granulation tissue forming with less slough and nonviable tissue. The anterior tibial wound just has a small amount of slough accumulation. 06/16/2022: The left dorsal foot wound continues to contract. His wrap slipped a little bit, however, and his foot is more swollen. The right medial leg wound continues to improve. The underlying tissue is more vital-appearing and there is less slough. He does have substantial drainage. The small anterior tibial wound has a bit of slough accumulation. 06/23/2022: The left dorsal foot wound is about half the size as it was last week. There  is just some light slough on the surface and some eschar buildup around the edges. The right anterior leg wound is small with a little slough on the surface. Unfortunately, the right medial leg wound deteriorated fairly substantially. It is malodorous with gray/green/black discoloration. 06/30/2022: The left dorsal foot wound continues to contract. It is nearly closed with just a light layer of slough present. The right anterior leg wound is about the same size and has a small amount of slough on the surface. The right medial leg wound looks a lot better than it did last week. The odor has abated and the tissue is less pulpy and necrotic-looking. There is still a fairly thick layer of slough present. 12/13; the left dorsal foot wound looks very healthy. There was no need to change the dressing here and no debridement. He has a small area on the right anterior lower leg apparently had not changed much in size. The area on the medial leg is the most problematic area this is large with some depth and a completely nonviable surface today. 07/14/2022: The left dorsal foot wound is smaller today with just a little slough and eschar present. The right anterior lower leg wound is about the same without any significant change. The medial leg wound has a black and green surface and is malodorous. No  purulent drainage and the periwound skin is intact. Edema control is suboptimal. 07/21/2022: The dorsal foot wound continues to contract and is nearly closed with just a little bit of slough and eschar on the surface. The right anterior lower leg wound is shallower today but otherwise the dimensions are unchanged. The medial leg wound looks significantly better although it still has a nonviable surface; the odor and black/green surface has resolved. His culture came back with multiple species sensitive to Augmentin, which was prescribed and a very low level of Pseudomonas, which should be handled by the topical gentamicin we have been using. 07/28/2022: The dorsal foot wound is down to just a very tiny opening with a little eschar on the surface. The right anterior tibial wound is about the same with some slough buildup. The right medial leg wound looks quite a bit better this week. There is thick rubbery slough on the surface, but the odor and drainage has improved significantly. 08/04/2022: The dorsal foot wound is almost healed, but the periwound skin has broken down. This appears to be secondary to moisture and adhesive from the absorbent pad. The right anterior tibial wound is shallower. The right medial leg wound continues to improve. It is smaller, still with rubbery slough on the surface. No malodor or purulent drainage. 08/11/2022: The dorsal foot wound is about the same size. It is a little bit dry with some slough accumulation. The right anterior tibial wound is also unchanged. The right medial leg wound is filling in further with good granulation tissue. Still with some slough buildup on the surface. 08/18/2022: His left leg is bright red, hot, and swollen with cellulitis. The dorsal foot wound actually looks quite good and is superficial with just a little bit of slough accumulation. The right anterior tibial wound is unchanged. The right medial leg wound has filled in with good granulation  tissue but has copious amounts of drainage. 08/25/2022: His cellulitis has responded nicely to the oral antibiotics. His dorsal foot wound is smaller but has a fair amount of slough buildup. The right anterior tibial wound remains stable and unchanged. The right medial leg wound has more granulation tissue  under a layer of slough, but continues to have copious drainage. The drainage is actually causing skin irritation and threatens to result in breakdown. 09/01/2022: He continues to have profuse drainage from his wounds which is resulting in tissue maceration on both the dorsum of his left foot and the periwound distal to his medial leg wound. The dorsal foot wound is nearly healed, despite this. The medial right leg wound is filling in with good granulation tissue. We are working on getting home health assistance to change his dressings more frequently to try and control the drainage better. 09/08/2022: We have had success in controlling his drainage. The tissue maceration has improved significantly and his swelling has come down quite markedly. The medial right leg wound is much more superficial and has substantially less nonviable tissue present. The anterior tibial wound is a little bit larger, however with some nonviable tissue present. 09/15/2022: The dorsal foot wound is nearly closed. The anterior tibial wound measures slightly larger today. The medial right leg wound continues to improve quite dramatically with less nonviable tissue and more robust-looking granulation. Edema control is significantly better. 09/22/2022: The left dorsal foot wound remains open, albeit quite tiny. The anterior tibial wound is a little bit larger again today. The medial right leg wound continues to improve with an increase in robust and healthy granulation tissue and less accumulation of nonviable tissue. 09/29/2022: His home health providers wrapped his left leg extremely tightly and he ended up having to cut the wrap  off of his foot. This resulted in his foot being very swollen and that the dorsal foot wound is a little bit larger today. The anterior tibial wound is about the same size with a little slough on the surface. The medial right leg wound continues to fill in. There are very few areas with any significant depth. There is still slough accumulation. 3/13; Patient has a left dorsal foot wound along with a right anterior tibial wound and right medial wound. We are using silver alginate to the left dorsal foot [name], HERRERA ([id number]) [id number] Page 5 of 18 wound, Santyl and sodium thiosulfate compound ointment to the right lower extremity wounds under 4 layer compression. Patient has no issues or complaints today. 10/13/2022: The left dorsal foot wound is a little bit bigger today, but his foot is also a little bit more swollen. The right medial leg wound has filled in more and has substantially less depth. There is still some nonviable tissue present. The small anterior lower leg wound looks about the same. 10/20/2022: The left dorsal foot wound is epithelializing. There does not appear to be much open at this site. There is some eschar around the edges. The right medial leg wound continues to fill in with granulation tissue. The small anterior lower leg wound is a little bit bigger and there is a chunk of calcium protruding. He continues to have fairly copious drainage from the right leg. 10/27/2022: His home health agency failed to show up at all this past week and as a result, the excessive drainage from his right leg wound has resulted in tissue breakdown. His leg is red and irritated. The wounds themselves look about the same. The wound on his left dorsal foot is nearly closed underneath some eschar. 11/03/2022: We continue to have difficulty with his home health agency. He continues to have significant drainage from his right leg wound which has resulted in even further  tissue breakdown. He also was not taking his Lasix as prescribed  and I think much of the drainage has been his lymphedema fluid choosing the path of least resistance. The wound on his left dorsal foot is closed, but in the process of cleaning the site, dry skin was pulled off and he has a new small superficial wound just adjacent to where the dorsal foot wound was. 11/12/2022: His left dorsal foot is completely healed. The right medial leg continues to deteriorate. He is not taking his Lasix as prescribed, nor is he using his lymphedema pumps at all. As a result, he has massive amounts of drainage saturating his dressings and causing further tissue breakdown on his medial leg. Today, his dressings had a foul odor, as well. 11/19/2022: His right medial leg looks even worse today. There is more erythema and moisture related tissue breakdown. The 2 anterior wounds look a little bit better. The culture that I took last week did produce Pseudomonas and he is currently taking levofloxacin for this. 11/26/2022: There has been significant improvement to his right medial leg. It is far less raw and many of the superficial open areas have epithelialized. He continues to cut the foot portion of his wrap and as result his foot is bulbous and swollen compared to the rest of his leg, where edema control is good. The smaller of the 2 anterior tibial wound is closed, while the larger is contracting somewhat with a bit of slough at the base. 12/03/2022: His leg continues to demonstrate improvement. There has been epithelialization of much of the denuded area. He has a new small anterior tibial wound that has opened around a nidus of calcium. Edema control is improved. 12/10/2022: Continued improvement in the periwound skin. The anterior tibial wounds are about the same, but the medial leg wound has improved further. There is less nonviable tissue present. 12/17/2022: Between his visit on Tuesday with the nurse and today, he  has had significantly more drainage which has resulted in some periwound tissue maceration. I suspect this correlates somewhat with his diuretic use, as he says he takes his diuretic religiously on Saturday, Sunday, and Monday and then does not take it after Tuesday due to being out and about and not being close to easy restroom access. I suspect the excess fluid is just simply draining out of his leg. 12/21/2022: This was supposed to be a nurse visit, but his leg has broken down so significantly, that we have converted to a wound care visit. He did take his diuretics over the weekend, but despite this, he has had copious drainage and all of his tissues have broken down substantially from the right medial leg ulcer all the way down to his ankle and even wrapping posterior to this. There is blue-green staining on his dressing, suggesting ongoing presence of Pseudomonas aeruginosa. 12/24/2022: The wound already looks better than it did 2 days ago. He has been taking ciprofloxacin and the Urgo clean dressing we applied did a much better job of wicking the drainage away from his leg. There is slough on the entire surface of the medial leg wound, but the erythema and induration has improved in the past couple of days. 12/31/2022: Continued improvement of the wound. He says that the pain has basically abated since being on ciprofloxacin. Still with fairly heavy drainage and slough accumulation. 01/07/2023: Overall continued improvement. Still with heavy slough buildup, but less drainage. 01/14/2023: There is a little bit of improvement. The skin is still looking a bit excoriated. He has a few more days left of  ciprofloxacin. 01/21/2023: There has been significant improvement. The periwound skin is in much better condition. The ulcers are shallower and actually have some good granulation tissue present under some slough. 01/28/2023: The wound continues to improve. All of the open areas measured smaller today. There  is slough accumulation on the surfaces. His drainage has decreased markedly. 02/04/2023: Continued improvement in the wound. Drainage remains significantly diminished. Slough accumulation is present but to a lesser extent. Periwound skin continues to improve. Edema control is good. 02/18/2023: The heavy drainage persists, although it is was not as significant today as it was at his nurse visit on Tuesday. The drainage is yellow and does not have the blue-green discoloration suggestive of Pseudomonas. The actual wounds on his leg are getting smaller, but the periwound skin is quite excoriated. 02/25/2023: His periwound skin is quite excoriated and he has a lot of clear yellow drainage. The main wounds are filling in further and epithelializing. 03/04/2023: The excoriation of the periwound skin has improved. Most of that area has dried up. The main wounds have less slough in them today. 03/11/2023: The main wounds have accumulated a little bit of slough. The periwound excoriation continues to improve. Unfortunately, he suffered a fall over the previous weekend and has abrasions on his knees bilaterally and on his right wrist and upper arm. In addition, the skin on his left lower leg apparently blistered and opened, leaving several superficial wounds on this extremity. He has been approved for the [facility] and Press wound VAC and has the equipment with him today. 03/18/2023: He has extensive excoriation to the periwound skin on the right medial lower leg. The actual wounds themselves are smaller with slough accumulation. The top of his left foot has begun weeping again. Using the [facility] and Press wound VAC, he says that he through 4 canisters. He seems to be very fluid overloaded, without any significant dyspnea. 03/25/2023: The wound to his left anterior tibial surface has healed. The left dorsal foot wound has gotten smaller and has a small area of hypertrophic granulation tissue present. The periwound skin on the  right medial lower leg looks slightly better, but still appears excoriated. The actual wounds continue to contract but are still accumulating quite a bit of slough. The tiny right anterior leg wounds are contracting. 04/01/2023: The left dorsal foot wound got a little bit bigger this week. There is slough accumulation. The periwound skin on the right medial lower leg continues to improve. The main medial right lower leg wounds have a dark gray discoloration and there is a strong odor coming from them. The back of his right lower leg just above the ankle also has a little bit of new breakdown where he is draining edema fluid. The small anterior leg wounds are stable. 04/08/2023: Since starting antibiotics last week, there has been tremendous improvement. The left dorsal foot wound is nearly closed with just a little bit of eschar on the surface. The right medial lower leg is less excoriated and red and he has had substantially less drainage. The amount of slough on the open [place], [name] ([id number]) [id number] Page 6 of 18 areas of his wounds has decreased markedly and there is no longer any odor. The back of his right lower leg that was newly open last week has dried up, but is not completely closed yet. 04/15/2023: The improvement continues with significantly less drainage, resulting in less excoriation and irritation. The back of his right lower leg is closed, as is the left  dorsal foot wound. The open areas on the right medial leg are smaller. 04/22/2023: His drainage has diminished significantly. The tissue maceration and excoriation surrounding the right medial leg wound has also improved substantially. There is slough on all of the open wound surfaces. He does have a bit of an odor coming from his wound today, but there is no purulent drainage nor any significant discoloration of the drainage in his VAC. 04/29/2023: He has a new wound on his left lower anterior tibial  surface. This was a blister that came up and then ruptured. There is thin slough on the wound surface but no concern for infection. He has had significantly diminished drainage from his right medial leg ulcers. He was previously going through 4 canisters a day secondary to drainage and this week, he has used only 1 for the entire week. The open areas on his right medial leg are smaller and fewer in number. There is slough on all of the surfaces. No malodor appreciated. 05/06/2023: The wound on his left anterior tibial surface has epithelialized significantly. There is just a small area open in the center. The wound is clean without any slough or other debris accumulation. The drainage on the right leg has almost completely dry. There is slough on the open wound surfaces with significantly more epithelialized between the open sites. He continues on levofloxacin. 05/13/2023: His left lower leg wound is healed. The right lower leg wound continues to improve. The drainage has been minimal and he has not been in the wound VAC for over a week now. There is better skin filling between the open areas and less slough accumulation on the open surfaces. 05/20/2023: The anterior leg wounds are closing up and there is no slough on the open areas that remain. His medial leg wound was measured longer today, but this appears to include some stuck on silver alginate and dry eschar, rather than the actual open wound areas. There is slough accumulation on the open surfaces. Edema control is good and his drainage has not been excessive. 05/27/2023: His wrap slipped up on his foot and he has a new small ulcer on the dorsal aspect of his right foot. The other wounds are basically stable with the usual thick slough accumulation. Drainage remains well-controlled. Edema control is good. 06/03/2023: The dorsal foot ulcer is healed. The other wounds actually are quite a bit smaller this week. There is less depth to the large  medial leg ulcers. Edema control is excellent. 06/10/2023: He has had a new wound open on his left lower leg, secondary to friction from his compression wrap. The medial leg ulcers are all smaller; the anterior wounds were measured larger, but this appears to be due to some minor moisture-related breakdown. He continues on levofloxacin. Electronic Signature(s) Signed: 06/10/2023 12:06:16 PM By: Duanne Guess MD FACS Entered By: Duanne Guess on 06/10/2023 09:06:15 -------------------------------------------------------------------------------- Physical Exam Details Patient Name: Date of Service: AJANI, [name] 06/10/2023 11:00 A M Medical Record Number: [id number] Patient Account Number: [id number] Date of Birth/Sex: Treating RN: [birth year] (72 y.o. M) Primary Care Provider: [name] Other Clinician: Referring Provider: Treating Provider/Extender: [name] in Treatment: 46 Constitutional . . . . no acute distress. Respiratory Normal work of breathing on room air.. Notes 06/10/2023: He has had a new wound open on his left lower leg, secondary to friction from his compression wrap. The medial leg ulcers are all smaller; the anterior wounds were measured larger, but this appears to be  due to some minor moisture-related breakdown. Electronic Signature(s) Signed: 06/10/2023 12:06:54 PM By: Duanne Guess MD FACS Entered By: Duanne Guess on 06/10/2023 09:06:54 Physician Orders Details -------------------------------------------------------------------------------- [name] ([id number]) [id number] Page 7 of 18 Patient Name: Date of Service: WEILAND, SERRETTE 06/10/2023 11:00 A M Medical Record Number: [id number] Patient Account Number: [id number] Date of Birth/Sex: Treating RN: [birth year] (72 y.o. 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 - Dr. [name] RM 1 Return A Anesthetic (In clinic) Topical Lidocaine 4% applied to wound bed Bathing/ Shower/ Hygiene May shower with protection but do not get wound dressing(s) wet. Protect dressing(s) with water repellant cover (for example, large plastic bag) or a cast cover and may then take shower. Lymphedema Treatment Plan - Exercise, Compression and Elevation Bilateral Lower Extremities Exercise daily as tolerated. (Walking, ROM, Calf Pumps and Toe Taps) Compression Garments, Class II. Wear daily when out of bed. May remove at bedtime Compression Wraps as ordered Elevate legs 30 - 60 minutes at or above heart level at least 3 - 4 times daily as able/tolerated Avoid standing for long periods and elevate leg(s) parallel to the floor when sitting Use Pneumatic Compression Device on leg(s) 2-3 times a day for 45-60 minutes Wound Treatment Wound #12 - Lower Leg Wound Laterality: Left, Anterior Cleanser: Soap and Water 1 x Per Week/30 Days Discharge Instructions: May shower and wash wound with dial antibacterial soap and water prior to dressing change. Cleanser: Wound Cleanser 1 x Per Week/30  Days Discharge Instructions: Cleanse the wound with wound cleanser prior to applying a clean dressing using gauze sponges, not tissue or cotton balls. Peri-Wound Care: Sween Lotion (Moisturizing lotion) 1 x Per Week/30 Days Discharge Instructions: Apply moisturizing lotion as directed Prim Dressing: Maxorb Extra Ag+ Alginate Dressing, 2x2 (in/in) 1 x Per Week/30 Days ary Discharge Instructions: Apply to wound bed as instructed Secondary Dressing: ABD Pad, 8x10 1 x Per Week/30 Days Discharge Instructions: Apply over primary dressing as directed. Compression Wrap: ThreePress (3 layer compression wrap) 1 x Per Week/30 Days Discharge Instructions: Apply three layer compression as directed. Wound #3 - Lower Leg Wound Laterality: Right, Medial Cleanser: Soap and Water 1 x Per Week/30 Days Discharge Instructions: May shower and wash wound with dial antibacterial soap and water prior to dressing change. Cleanser: Wound Cleanser 1 x Per Week/30 Days Discharge Instructions: Cleanse the wound with wound cleanser prior to applying a clean dressing using gauze sponges, not tissue or cotton balls. Peri-Wound Care: Skin Prep 1 x Per Week/30 Days Discharge Instructions: cavelon to macerated wound margins Peri-Wound Care: Sween Lotion (Moisturizing lotion) 1 x Per Week/30 Days Discharge Instructions: Apply moisturizing lotion as directed [name], [name] ([id number]) [id number] Page 8 of 18 Prim Dressing: Maxorb Extra Ag+ Alginate Dressing, 4x4.75 (in/in) 1 x Per Week/30 Days ary Discharge Instructions: Apply to excoriated weeping areas  Secondary Dressing: ABD Pad, 8x10 1 x Per Week/30 Days Discharge Instructions: Apply over primary dressing as directed. Compression Wrap: ThreePress (3 layer compression wrap) 1 x Per Week/30 Days Discharge Instructions: Apply three layer compression as directed. Compression Wrap: Unnaboot w/Calamine, 4x10 (in/yd) 1 x Per Week/30 Days Discharge  Instructions: Apply Unnaboot on distal foot and top of lower leg Wound #6 - Lower Leg Wound Laterality: Right, Anterior Cleanser: Soap and Water 1 x Per Week/30 Days Discharge Instructions: May shower and wash wound with dial antibacterial soap and water prior to dressing change. Cleanser: Wound Cleanser 1 x Per Week/30 Days Discharge Instructions: Cleanse the wound with wound cleanser prior to applying a clean dressing using gauze sponges, not tissue or cotton balls. Peri-Wound Care: Sween Lotion (Moisturizing lotion) 1 x Per Week/30 Days Discharge Instructions: Apply moisturizing lotion as directed Prim Dressing: Maxorb Extra Ag+ Alginate Dressing, 2x2 (in/in) 1 x Per Week/30 Days ary Discharge Instructions: Apply to wound bed as instructed Secondary Dressing: ABD Pad, 8x10 1 x Per Week/30 Days Discharge Instructions: Apply over primary dressing as directed. Compression Wrap: ThreePress (3 layer compression wrap) 1 x Per Week/30 Days Discharge Instructions: Apply three layer compression as directed. Patient Medications llergies: No Known Allergies A Notifications Medication Indication Start End 06/10/2023 lidocaine DOSE topical 4 % cream - cream topical Electronic Signature(s) Signed: 06/10/2023 12:51:55 PM By: Duanne Guess MD FACS Previous Signature: 06/10/2023 11:57:20 AM Version By: [name] Entered By: Duanne Guess on 06/10/2023 09:07:17 -------------------------------------------------------------------------------- Problem List Details Patient Name: Date of Service: [name] 06/10/2023 11:00 A M Medical Record Number: [id number] Patient Account Number: [id number] Date of Birth/Sex: Treating RN: [birth year] (72 y.o. 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 02/09/2022 No Yes I89.0 Lymphedema, not elsewhere classified 02/09/2022 No Yes I25.10 Atherosclerotic heart disease of native coronary artery without angina pectoris 02/09/2022 No Yes I87.2 Venous insufficiency (chronic) (peripheral) 02/09/2022 No Yes Z86.718 Personal history of other venous thrombosis and embolism 02/09/2022 No Yes I10 Essential (primary) hypertension 02/09/2022 No Yes Inactive Problems Resolved Problems ICD-10 Code Description Active Date Resolved Date L97.821 Non-pressure chronic ulcer of other part of left lower leg limited to breakdown of skin 03/11/2023 03/11/2023 L98.492 Non-pressure chronic ulcer of skin of other sites with fat layer exposed 03/11/2023 03/11/2023 L97.521 Non-pressure chronic ulcer of other part of left foot limited to breakdown of skin 03/18/2023 02/09/2022 L97.512 Non-pressure chronic ulcer of other part of right foot with fat layer exposed 05/27/2023 02/17/2022 Electronic Signature(s) Signed: 06/10/2023 11:59:30 AM By: Duanne Guess MD FACS Entered By: Duanne Guess on 06/10/2023 08:59:29 -------------------------------------------------------------------------------- Progress Note Details Patient Name: Date of Service: [name] 06/10/2023 11:00 A M Medical Record Number: [id number] Patient Account Number: [id number] Date of Birth/Sex: Treating RN: [birth year] (72 y.o. M) Primary Care Provider: [name] Other Clinician: Referring Provider: Treating Provider/Extender: [name] in Treatment: 69 Subjective Chief Complaint Information obtained from Patient Patient seen for  complaints of Non-Healing Wounds. History of Present Illness (HPI) CAELEB, [name] ([id number]) [id number] Page 10 of 18 ADMISSION 02/09/2022 This is a 72 year old man  who recently moved to [place] from [place]. He has a history of of coronary artery disease and prior left popliteal vein DVT . He is not diabetic and does not smoke. 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 He has had multiple spinal surgeries and has limited mobility. He primarily uses a motorized wheelchair. He has had lymphedema for many years and does have lymphedema pumps although he says he does not use them frequently. He has wounds on his bilateral lower extremities. He was receiving care in [place] for these prior to his move. They were apparently packing his wounds with Betadine soaked gauze. He has worn compression wraps in the past but not recently. He did say that they helped tremendously. In clinic today, his right ABI is noncompressible but has good signals; the left ABI was 1.17. No formal vascular studies have been performed. On his left dorsal foot, there is a circular lesion that exposes the fat layer. There is fairly heavy slough accumulation within the wound, but no significant odor. On his right medial calf, he has multiple pinholes that have slough buildup in them and probe for about 2 to 4 mm in depth. They are draining clear fluid. On his right lateral midfoot, he has ulceration consistent with venous stasis. It is geographic and has slough accumulation. 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 02/17/2022: All of the wounds are little bit larger today and have more slough accumulation. Today, the medial calf openings are larger and probe to something that feels calcified. There is odor coming from his wounds. His vascular studies are scheduled for August 9 and 10. 02-24-2022  upon evaluation today patient presents for follow-up concerning his ongoing issues here with his lower extremities. With that being said he does have significant problems with what appears to be cellulitis unfortunately the PCR culture that was sent last week got crushed by [employer] in transit. With that being said we are going to reobtain a culture today although I am actually to do it on the right medial leg as this seems to be the most inflamed area today where there is some questionable drainage as well. I am going to remove some of the calcium deposits which are loosening obtain a deeper culture from this area. Fortunately I do not see any evidence of active infection systemically which is good news but locally I think we are having a bigger issue here. He does have his appointment next Thursday with vascular. Patient has also been referred to [facility] for further evaluation and treatment of the calcinosis for possible sulfate injections. 03/04/2022: The culture that was performed last week grew out multiple species including Klebsiella, Morganella, and Pseudomonas aeruginosa. He had been prescribed Bactrim but this was not adequate to cover all of the species and levofloxacin was recommended. He completed the Bactrim but did not pick up the levofloxacin and begin taking it until yesterday. 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 He had arterial studies performed today. He does have evidence of peripheral arterial disease. Results copied here: +-------+-----------+-----------+------------+------------+ ABI/TBIT oday's ABIT oday's TBIPrevious ABIPrevious TBI +-------+-----------+-----------+------------+------------+ Right [place] 0.75    +-------+-----------+-----------+------------+------------+ Left  0.57     +-------+-----------+-----------+------------+------------+ Arterial wall calcification precludes accurate ankle pressures and ABIs. Summary: Right: Resting right  ankle-brachial index indicates noncompressible right lower extremity arteries. The right toe-brachial index is normal. Left: Resting left ankle-brachial index indicates noncompressible left lower extremity arteries. The left toe-brachial index is abnormal. He continues to have further tissue breakdown at the right medial calf and there is ample slough accumulation along with necrotic subcutaneous tissue. The left dorsal foot wound has built up slough, as well. The right medial foot wound is nearly closed with just a light layer of eschar over the surface. The right dorsal lateral foot wound has also accumulated a fair amount of slough and remains quite tender. 03/10/2022: He has been taking the prescribed levofloxacin and has about 3 days left of this. The erythema and induration on his right leg has improved. He continues to experience further breakdown of the right medial calf wound. There is extensive slough and debris accumulation there. There is heavy slough on his left dorsal foot wound, but no odor or purulent drainage. The right medial foot wound appears to be closed. The right dorsal lateral foot wound also has a fair amount of slough but it is less tender than at our last visit. He still does not have an appointment with dermatology. 03/22/2022: The right anterior tibial wound has closed. The right lateral foot wound is nearly closed with just a little bit of slough. The left dorsal foot wound is smaller and continues to have some slough buildup but less so than at prior visits. There is good granulation tissue underlying the slough. Unfortunately the right medial calf wound continues to deteriorate. It has a foul odor today and a lot of necrotic tissue, the surrounding skin is also erythematous and moist. 03/30/2022: The right  lateral foot wound continues to contract and just has a little bit of slough and eschar present. The left dorsal foot wound is also smaller with slough overlying good granulation tissue. The right medial calf wound actually looks quite a bit better today; there is no odor and there is much less necrotic tissue although some remains present. We have been using topical gentamicin and he has been taking oral Augmentin. 04/07/2022: The patient saw dermatology at [facility] last week. Unfortunately, it appears that they did not read any of the documentation that was provided. 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 Overall, it was a highly unsatisfactory consultation. In the meantime, however, we were able to identify compounding pharmacy who could create a topical sodium thiosulfate ointment. The patient has that with him today. Both of the right foot wounds are now closed. The left dorsal foot wound has contracted but still has a layer of slough on the surface. The medial calf wounds on the right all look a little bit better. They still have heavy slough along with the chunks of calcium. Everything is stained purple. 04/14/2022: The wound on his dorsal left foot is a little bit smaller again today. There is still slough accumulation on the surface. The medial calf wounds have quite a bit less slough accumulation today. His right leg, however, is warm and erythematous, concerning for cellulitis. 04/14/2022: He completed his course of Augmentin yesterday. The medial calf wound sites are much cleaner today and shallower. There is less fragmented calcium in the wounds. He has a lot of lymphedema  fluid-like drainage from these wounds, but no  purulent discharge or malodor. The wound on his dorsal left foot has also contracted and is shallower. There is a layer of slough over healthy granulation tissue. 05/05/2022: The left dorsal foot wound is smaller today with less slough and more granulation tissue. The right medial calf wounds are shallower, but have extensive slough and some fat necrosis. The drainage has diminished considerably, however. 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 05/12/2022: The left dorsal foot wound continues to contract and fill with granulation tissue. There is slough on the wound surface. The right medial calf wounds also continue to fill with healthy tissue, but there is remaining slough and fat necrosis present. He had a significant increase in his drainage this week and it was a bright yellow-green color. He also had a new wound open over his right anterior tibial surface associated with the spike of cutaneous calcium. The leg is more red, as well, concerning for infection. [name], [name] ([id number]) [id number] Page 11 of 18 05/19/2022: His culture returned positive for Pseudomonas. He is currently taking a course of oral levofloxacin. We have also been using topical gentamicin mixed in with his sodium thiosulfate. All of the wounds look better this week. 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 There is slough and nonviable subcutaneous tissue still present. The new anterior tibial wound just has a light layer of slough. The dorsal foot wound also has some slough present and appears a little dry today. 05/26/2022: The right medial leg wound continues to improve. It is feeling with granulation tissue, but still accumulates a fairly thick layer of slough on the surface. The more recent anterior tibial wound has remained quite small,  but also has some slough on the surface. The left dorsal foot wound has contracted somewhat and has perimeter epithelialization. He completed his oral levofloxacin. 06/02/2022: The left dorsal foot wound continues to improve significantly. There is just a little slough on the wound surface. The right medial leg wound had black drainage, but it looks like silver alginate was applied last week and I theorized that silver sulfide was created with a combination of the silver alginate and the sodium thiosulfate. It did, however, have a bit of an odor to it and extensive slough accumulation. The small anterior tibial wound also had a bit of slough and nonviable subcutaneous tissue present. The skin of his leg was rather macerated, as well. 06/09/2022: The left dorsal foot wound is smaller again this week with just a light layer of slough on the surface. The right medial leg wound looks substantially better. The leg and periwound skin are no longer red and macerated. There is good granulation tissue forming with less slough and nonviable tissue. The anterior tibial wound just has a small amount of slough accumulation. 06/16/2022: The left dorsal foot wound continues to contract. His wrap slipped a little bit, however, and his foot is more swollen. The right medial leg wound continues to improve. The underlying tissue is more vital-appearing and there is less slough. He does have substantial drainage. The small anterior tibial wound has a bit of slough accumulation. 06/23/2022: The left dorsal foot wound is about half the size as it was last week. There is just some light slough on the surface and some eschar buildup around the edges. The right anterior leg wound is  small with a little slough on the surface. Unfortunately, the right medial leg wound deteriorated fairly substantially. It is malodorous with gray/green/black discoloration. 06/30/2022: The left dorsal foot wound continues to contract. It is nearly  closed with just a light layer of slough present. The right anterior leg wound is about the same size and has a small amount of slough on the surface. The right medial leg wound looks a lot better than it did last week. The odor has abated and the tissue is less pulpy and necrotic-looking. There is still a fairly thick layer of slough present. 12/13; the left dorsal foot wound looks very healthy. There was no need to change the dressing here and no debridement. He has a small area on the right anterior lower leg apparently had not changed much in size. The area on the medial leg is the most problematic area this is large with some depth and a completely nonviable surface today. 07/14/2022: The left dorsal foot wound is smaller today with just a little slough and eschar present. The right anterior lower leg wound is about the same without any significant change. The medial leg wound has a black and green surface and is malodorous. No purulent drainage and the periwound skin is intact. Edema control is suboptimal. 07/21/2022: The dorsal foot wound continues to contract and is nearly closed with just a little bit of slough and eschar on the surface. The right anterior lower leg wound is shallower today but otherwise the dimensions are unchanged. The medial leg wound looks significantly better although it still has a nonviable surface; the odor and black/green surface has resolved. His culture came back with multiple species sensitive to Augmentin, which was prescribed and a very low level of Pseudomonas, which should be handled by the topical gentamicin we have been using. 07/28/2022: The dorsal foot wound is down to just a very tiny opening with a little eschar on the surface. The right anterior tibial wound is about the same with some slough buildup. The right medial leg wound looks quite a bit better this week. There is thick rubbery slough on the surface, but the odor and drainage has improved  significantly. 08/04/2022: The dorsal foot wound is almost healed, but the periwound skin has broken down. This appears to be secondary to moisture and adhesive from the absorbent pad. The right anterior tibial wound is shallower. The right medial leg wound continues to improve. It is smaller, still with rubbery slough on the surface. No malodor or purulent drainage. 08/11/2022: The dorsal foot wound is about the same size. It is a little bit dry with some slough accumulation. The right anterior tibial wound is also unchanged. The right medial leg wound is filling in further with good granulation tissue. Still with some slough buildup on the surface. 08/18/2022: His left leg is bright red, hot, and swollen with cellulitis. The dorsal foot wound actually looks quite good and is superficial with just a little bit of slough accumulation. The right anterior tibial wound is unchanged. The right medial leg wound has filled in with good granulation tissue but has copious amounts of drainage. 08/25/2022: His cellulitis has responded nicely to the oral antibiotics. His dorsal foot wound is smaller but has a fair amount of slough buildup. The right anterior tibial wound remains stable and unchanged. The right medial leg wound has more granulation tissue under a layer of slough, but continues to have copious drainage. The drainage is actually causing skin irritation and threatens to  result in breakdown. 09/01/2022: He continues to have profuse drainage from his wounds which is resulting in tissue maceration on both the dorsum of his left foot and the periwound distal to his medial leg wound. The dorsal foot wound is nearly healed, despite this. The medial right leg wound is filling in with good granulation tissue. We are working on getting home health assistance to change his dressings more frequently to try and control the drainage better. 09/08/2022: We have had success in controlling his drainage. The tissue  maceration has improved significantly and his swelling has come down quite markedly. The medial right leg wound is much more superficial and has substantially less nonviable tissue present. The anterior tibial wound is a little bit larger, however with some nonviable tissue present. 09/15/2022: The dorsal foot wound is nearly closed. The anterior tibial wound measures slightly larger today. The medial right leg wound continues to improve quite dramatically with less nonviable tissue and more robust-looking granulation. Edema control is significantly better. 09/22/2022: The left dorsal foot wound remains open, albeit quite tiny. The anterior tibial wound is a little bit larger again today. The medial right leg wound continues to improve with an increase in robust and healthy granulation tissue and less accumulation of nonviable tissue. 09/29/2022: His home health providers wrapped his left leg extremely tightly and he ended up having to cut the wrap off of his foot. This resulted in his foot being very swollen and that the dorsal foot wound is a little bit larger today. The anterior tibial wound is about the same size with a little slough on the surface. The medial right leg wound continues to fill in. There are very few areas with any significant depth. There is still slough accumulation. 3/13; Patient has a left dorsal foot wound along with a right anterior tibial wound and right medial wound. We are using silver alginate to the left dorsal foot wound, Santyl and sodium thiosulfate compound ointment to the right lower extremity wounds under 4 layer compression. Patient has no issues or complaints today. 10/13/2022: The left dorsal foot wound is a little bit bigger today, but his foot is also a little bit more swollen. The right medial leg wound has filled in more and has substantially less depth. There is still some nonviable tissue present. The small anterior lower leg wound looks about the  same. 10/20/2022: The left dorsal foot wound is epithelializing. There does not appear to be much open at this site. There is some eschar around the edges. The right medial leg wound continues to fill in with granulation tissue. The small anterior lower leg wound is a little bit bigger and there is a chunk of calcium protruding. He continues to have fairly copious drainage from the right leg. CLENT, LAFON ([id number]) [id number] Page 12 of 18 10/27/2022: His home health agency failed to show up at all this past week and as a result, the excessive drainage from his right leg wound has resulted in tissue breakdown. His leg is red and irritated. The wounds themselves look about the same. The wound on his left dorsal foot is nearly closed underneath some eschar. 11/03/2022: We continue to have difficulty with his home health agency. He continues to have significant drainage from his right leg wound which has resulted in even further tissue breakdown. He also was not taking his Lasix as prescribed and I think much of the drainage has been his lymphedema fluid choosing the path of least resistance. The wound on  his left dorsal foot is closed, but in the process of cleaning the site, dry skin was pulled off and he has a new small superficial wound just adjacent to where the dorsal foot wound was. 11/12/2022: His left dorsal foot is completely healed. The right medial leg continues to deteriorate. He is not taking his Lasix as prescribed, nor is he using his lymphedema pumps at all. As a result, he has massive amounts of drainage saturating his dressings and causing further tissue breakdown on his medial leg. Today, his dressings had a foul odor, as well. 11/19/2022: His right medial leg looks even worse today. There is more erythema and moisture related tissue breakdown. The 2 anterior wounds look a little bit better. The culture that I took last week did produce Pseudomonas and he is  currently taking levofloxacin for this. 11/26/2022: There has been significant improvement to his right medial leg. It is far less raw and many of the superficial open areas have epithelialized. He continues to cut the foot portion of his wrap and as result his foot is bulbous and swollen compared to the rest of his leg, where edema control is good. The smaller of the 2 anterior tibial wound is closed, while the larger is contracting somewhat with a bit of slough at the base. 12/03/2022: His leg continues to demonstrate improvement. There has been epithelialization of much of the denuded area. He has a new small anterior tibial wound that has opened around a nidus of calcium. Edema control is improved. 12/10/2022: Continued improvement in the periwound skin. The anterior tibial wounds are about the same, but the medial leg wound has improved further. There is less nonviable tissue present. 12/17/2022: Between his visit on Tuesday with the nurse and today, he has had significantly more drainage which has resulted in some periwound tissue maceration. I suspect this correlates somewhat with his diuretic use, as he says he takes his diuretic religiously on Saturday, Sunday, and Monday and then does not take it after Tuesday due to being out and about and not being close to easy restroom access. I suspect the excess fluid is just simply draining out of his leg. 12/21/2022: This was supposed to be a nurse visit, but his leg has broken down so significantly, that we have converted to a wound care visit. He did take his diuretics over the weekend, but despite this, he has had copious drainage and all of his tissues have broken down substantially from the right medial leg ulcer all the way down to his ankle and even wrapping posterior to this. There is blue-green staining on his dressing, suggesting ongoing presence of Pseudomonas aeruginosa. 12/24/2022: The wound already looks better than it did 2 days ago. He has  been taking ciprofloxacin and the Urgo clean dressing we applied did a much better job of wicking the drainage away from his leg. There is slough on the entire surface of the medial leg wound, but the erythema and induration has improved in the past couple of days. 12/31/2022: Continued improvement of the wound. He says that the pain has basically abated since being on ciprofloxacin. Still with fairly heavy drainage and slough accumulation. 01/07/2023: Overall continued improvement. Still with heavy slough buildup, but less drainage. 01/14/2023: There is a little bit of improvement. The skin is still looking a bit excoriated. He has a few more days left of ciprofloxacin. 01/21/2023: There has been significant improvement. The periwound skin is in much better condition. The ulcers are shallower and  actually have some good granulation tissue present under some slough. 01/28/2023: The wound continues to improve. All of the open areas measured smaller today. There is slough accumulation on the surfaces. His drainage has decreased markedly. 02/04/2023: Continued improvement in the wound. Drainage remains significantly diminished. Slough accumulation is present but to a lesser extent. Periwound skin continues to improve. Edema control is good. 02/18/2023: The heavy drainage persists, although it is was not as significant today as it was at his nurse visit on Tuesday. The drainage is yellow and does not have the blue-green discoloration suggestive of Pseudomonas. The actual wounds on his leg are getting smaller, but the periwound skin is quite excoriated. 02/25/2023: His periwound skin is quite excoriated and he has a lot of clear yellow drainage. The main wounds are filling in further and epithelializing. 03/04/2023: The excoriation of the periwound skin has improved. Most of that area has dried up. The main wounds have less slough in them today. 03/11/2023: The main wounds have accumulated a little bit of slough. The  periwound excoriation continues to improve. Unfortunately, he suffered a fall over the previous weekend and has abrasions on his knees bilaterally and on his right wrist and upper arm. In addition, the skin on his left lower leg apparently blistered and opened, leaving several superficial wounds on this extremity. He has been approved for the [facility] wound VAC and has the equipment with him today. 03/18/2023: He has extensive excoriation to the periwound skin on the right medial lower leg. The actual wounds themselves are smaller with slough accumulation. The top of his left foot has begun weeping again. Using the [facility] and [facility] wound VAC, he says that he through 4 canisters. He seems to be very fluid overloaded, without any significant dyspnea. 03/25/2023: The wound to his left anterior tibial surface has healed. The left dorsal foot wound has gotten smaller and has a small area of hypertrophic granulation tissue present. The periwound skin on the right medial lower leg looks slightly better, but still appears excoriated. The actual wounds continue to contract but are still accumulating quite a bit of slough. The tiny right anterior leg wounds are contracting. 04/01/2023: The left dorsal foot wound got a little bit bigger this week. There is slough accumulation. The periwound skin on the right medial lower leg continues to improve. The main medial right lower leg wounds have a dark gray discoloration and there is a strong odor coming from them. The back of his right lower leg just above the ankle also has a little bit of new breakdown where he is draining edema fluid. The small anterior leg wounds are stable. 04/08/2023: Since starting antibiotics last week, there has been tremendous improvement. The left dorsal foot wound is nearly closed with just a little bit of eschar on the surface. The right medial lower leg is less excoriated and red and he has had substantially less drainage. The amount of  slough on the open areas of his wounds has decreased markedly and there is no longer any odor. The back of his right lower leg that was newly open last week has dried up, but is not completely closed yet. 04/15/2023: The improvement continues with significantly less drainage, resulting in less excoriation and irritation. The back of his right lower leg is closed, as is the left dorsal foot wound. The open areas on the right medial leg are smaller. 04/22/2023: His drainage has diminished significantly. The tissue maceration and excoriation surrounding the right medial  leg wound has also improved substantially. There is slough on all of the open wound surfaces. He does have a bit of an odor coming from his wound today, but there is no purulent drainage nor any significant discoloration of the drainage in his VAC. MILES, NEUPERT ([id number]) [id number] Page 13 of 18 04/29/2023: He has a new wound on his left lower anterior tibial surface. This was a blister that came up and then ruptured. There is thin slough on the wound surface but no concern for infection. He has had significantly diminished drainage from his right medial leg ulcers. He was previously going through 4 canisters a day secondary to drainage and this week, he has used only 1 for the entire week. The open areas on his right medial leg are smaller and fewer in number. There is slough on all of the surfaces. No malodor appreciated. 05/06/2023: The wound on his left anterior tibial surface has epithelialized significantly. There is just a small area open in the center. The wound is clean without any slough or other debris accumulation. The drainage on the right leg has almost completely dry. There is slough on the open wound surfaces with significantly more epithelialized between the open sites. He continues on levofloxacin. 05/13/2023: His left lower leg wound is healed. The right lower leg wound continues to improve.  The drainage has been minimal and he has not been in the wound VAC for over a week now. There is better skin filling between the open areas and less slough accumulation on the open surfaces. 05/20/2023: The anterior leg wounds are closing up and there is no slough on the open areas that remain. His medial leg wound was measured longer today, but this appears to include some stuck on silver alginate and dry eschar, rather than the actual open wound areas. There is slough accumulation on the open surfaces. Edema control is good and his drainage has not been excessive. 05/27/2023: His wrap slipped up on his foot and he has a new small ulcer on the dorsal aspect of his right foot. The other wounds are basically stable with the usual thick slough accumulation. Drainage remains well-controlled. Edema control is good. 06/03/2023: The dorsal foot ulcer is healed. The other wounds actually are quite a bit smaller this week. There is less depth to the large medial leg ulcers. Edema control is excellent. 06/10/2023: He has had a new wound open on his left lower leg, secondary to friction from his compression wrap. The medial leg ulcers are all smaller; the anterior wounds were measured larger, but this appears to be due to some minor moisture-related breakdown. He continues on levofloxacin. Patient History Information obtained from Patient, Chart. Family History Cancer - Mother, Heart Disease - Father,Mother, Hypertension - Father,Siblings, Lung Disease - Siblings, No family history of Diabetes, Hereditary Spherocytosis, Kidney Disease, Seizures, Stroke, Thyroid Problems, Tuberculosis. Social History Former smoker - quit 1991, Marital Status - Married, Alcohol Use - Moderate, Drug Use - No History, Caffeine Use - Daily - coffee. Medical History Eyes Patient has history of Cataracts - right eye removed Denies history of Glaucoma, Optic Neuritis Ear/Nose/Mouth/Throat Denies history of Chronic sinus  problems/congestion, Middle ear problems Hematologic/Lymphatic Patient has history of Anemia - macrocytic, Lymphedema Cardiovascular Patient has history of Congestive Heart Failure, Deep Vein Thrombosis - left leg, Hypertension, Peripheral Venous Disease Endocrine Denies history of Type I Diabetes, Type II Diabetes Genitourinary Denies history of End Stage Renal Disease Integumentary (Skin) Denies history of History of Burn Oncologic  Denies history of Received Chemotherapy, Received Radiation Psychiatric Denies history of Anorexia/bulimia, Confinement Anxiety Hospitalization/Surgery History - cervical fusion. - lumbar surgery. - coronary stent placement. - lasik eye surgery. - hydrocele excision. Medical A Surgical History Notes nd Constitutional Symptoms (General Health) obesity Ear/Nose/Mouth/Throat hard of hearing Respiratory frozen diaphragm right Cardiovascular hyperlipidemia Musculoskeletal myositis, lumbar DDD, spinal stenosis, cervical facet joint syndrome Objective Constitutional no acute distress. Vitals Time Taken: 11:24 AM, Height: 71 in, Weight: 267 lbs, BMI: 37.2, Temperature: 97.4 F, Pulse: 61 bpm, Respiratory Rate: 18 breaths/min, Blood Pressure: 132/72 mmHg. Respiratory JASIRE, SANDFORD ([id number]) [id number] Page 14 of 18 Normal work of breathing on room air.. General Notes: 06/10/2023: He has had a new wound open on his left lower leg, secondary to friction from his compression wrap. The medial leg ulcers are all smaller; the anterior wounds were measured larger, but this appears to be due to some minor moisture-related breakdown. Integumentary (Hair, Skin) Wound #12 status is Open. Original cause of wound was Gradually Appeared. The date acquired was: 05/27/2023. The wound is located on the Left,Anterior Lower Leg. The wound measures 1.8cm length x 2cm width x 0.1cm depth; 2.827cm^2 area and 0.283cm^3 volume. There is Fat Layer  (Subcutaneous Tissue) exposed. There is no tunneling or undermining noted. There is a medium amount of serosanguineous drainage noted. The wound margin is distinct with the outline attached to the wound base. There is large (67-100%) red, friable granulation within the wound bed. There is no necrotic tissue within the wound bed. The periwound skin appearance did not exhibit: Callus, Crepitus, Excoriation, Induration, Rash, Scarring, Dry/Scaly, Maceration, Atrophie Blanche, Cyanosis, Ecchymosis, Hemosiderin Staining, Mottled, Pallor, Rubor, Erythema. Wound #3 status is Open. Original cause of wound was Gradually Appeared. The date acquired was: 12/07/2021. The wound has been in treatment 69 weeks. The wound is located on the Right,Medial Lower Leg. The wound measures 3cm length x 4.8cm width x 0.3cm depth; 11.31cm^2 area and 3.393cm^3 volume. There is Fat Layer (Subcutaneous Tissue) exposed. There is no tunneling or undermining noted. There is a medium amount of serosanguineous drainage noted. The wound margin is flat and intact. There is small (1-33%) red granulation within the wound bed. There is a large (67-100%) amount of necrotic tissue within the wound bed including Eschar and Adherent Slough. The periwound skin appearance had no abnormalities noted for texture. The periwound skin appearance had no abnormalities noted for moisture. The periwound skin appearance exhibited: Hemosiderin Staining, Rubor. The periwound skin appearance did not exhibit: Erythema. Periwound temperature was noted as No Abnormality. The periwound has tenderness on palpation. Wound #6 status is Open. Original cause of wound was Gradually Appeared. The date acquired was: 05/12/2022. The wound has been in treatment 56 weeks. The wound is located on the Right,Anterior Lower Leg. The wound measures 0.5cm length x 0.5cm width x 0.4cm depth; 0.196cm^2 area and 0.079cm^3 volume. There is Fat Layer (Subcutaneous Tissue) exposed.  There is no tunneling or undermining noted. There is a small amount of serous drainage noted. The wound margin is epibole. There is small (1-33%) pink granulation within the wound bed. There is a large (67-100%) amount of necrotic tissue within the wound bed including Eschar. The periwound skin appearance had no abnormalities noted for texture. The periwound skin appearance had no abnormalities noted for moisture. The periwound skin appearance exhibited: Hemosiderin Staining. The periwound skin appearance did not exhibit: Atrophie Blanche, Cyanosis, Ecchymosis, Mottled, Pallor, Rubor, Erythema. Periwound temperature was noted as No Abnormality. 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 There was a Excisional Skin/Subcutaneous Tissue Debridement with a total area of 2.83 sq cm performed by Duanne Guess, MD. With the following instrument(s): Curette to remove Non-Viable tissue/material. Material removed includes Subcutaneous Tissue and Slough and after achieving pain control using Lidocaine 4% T opical Solution. No specimens were taken. A time out was conducted at 11:48, prior to the start of the procedure. A Minimum amount of bleeding was controlled with Pressure. The procedure was tolerated well. Post Debridement Measurements: 1.8cm length x 2cm width x 0.1cm depth; 0.283cm^3 volume. Character of  Wound/Ulcer Post Debridement is improved. Severity of Tissue Post Debridement is: Fat layer exposed. Post procedure Diagnosis Wound #12: Same as Pre-Procedure Pre-procedure diagnosis of Wound #12 is a Lymphedema located on the Left,Anterior Lower Leg . There was a Three Layer Compression Therapy Procedure by [name], RN. Post procedure Diagnosis Wound #12: Same as Pre-Procedure Wound #3 Pre-procedure diagnosis of Wound #3 is a Lymphedema located on the Right,Medial Lower Leg . There was a Excisional Skin/Subcutaneous Tissue Debridement with a total area of 11.3 sq cm performed by Duanne Guess, MD. With the following instrument(s): Curette to remove Non-Viable tissue/material. Material removed includes Subcutaneous Tissue and Slough and after achieving pain control using Lidocaine 4% T opical Solution. No specimens were taken. A time out was conducted at 11:48, prior to the start of the procedure. A Minimum amount of bleeding was controlled with Pressure. The procedure was tolerated well. Post Debridement Measurements: 3cm length x 4.8cm width x 0.3cm depth; 3.393cm^3 volume. Character of Wound/Ulcer Post Debridement is improved. Post procedure Diagnosis Wound #3: Same as Pre-Procedure Pre-procedure diagnosis of Wound #3 is a Lymphedema located on the Right,Medial Lower Leg . There was a Three Layer Compression Therapy Procedure by [name], RN. Post procedure Diagnosis Wound #3: Same as Pre-Procedure Plan BRAYEN, HIERHOLZER ([id number]) [phone].pdf Page 15 of 18 Follow-up Appointments: Return Appointment in 1 week. - Dr. [name] RM 1 Anesthetic: (In clinic) Topical Lidocaine 4% applied to wound bed Bathing/ Shower/ Hygiene: May shower with protection but do not get wound dressing(s) wet. Protect dressing(s) with water repellant cover (for example, large plastic bag) or a cast cover and may then take shower. Lymphedema Treatment Plan - Exercise,  Compression and Elevation: Exercise daily as tolerated. (Walking, ROM, Calf Pumps and T T oe aps) Compression Garments, Class II. Wear daily when out of bed. May remove at bedtime Compression Wraps as ordered Elevate legs 30 - 60 minutes at or above heart level at least 3 - 4 times daily as able/tolerated Avoid standing for long periods and elevate leg(s) parallel to the floor when sitting Use Pneumatic Compression Device on leg(s) 2-3 times a day for 45-60 minutes The following medication(s) was prescribed: lidocaine topical 4 % cream cream topical was prescribed at facility WOUND #12: - Lower Leg Wound Laterality: Left, Anterior Cleanser: Soap and Water 1 x Per Week/30 Days Discharge Instructions: May shower and wash wound with dial antibacterial soap and water prior to dressing change. Cleanser: Wound  Cleanser 1 x Per Week/30 Days Discharge Instructions: Cleanse the wound with wound cleanser prior to applying a clean dressing using gauze sponges, not tissue or cotton balls. Peri-Wound Care: Sween Lotion (Moisturizing lotion) 1 x Per Week/30 Days Discharge Instructions: Apply moisturizing lotion as directed Prim Dressing: Maxorb Extra Ag+ Alginate Dressing, 2x2 (in/in) 1 x Per Week/30 Days ary Discharge Instructions: Apply to wound bed as instructed Secondary Dressing: ABD Pad, 8x10 1 x Per Week/30 Days Discharge Instructions: Apply over primary dressing as directed. Com pression Wrap: ThreePress (3 layer compression wrap) 1 x Per Week/30 Days Discharge Instructions: Apply three layer compression as directed. WOUND #3: - Lower Leg Wound Laterality: Right, Medial Cleanser: Soap and Water 1 x Per Week/30 Days Discharge Instructions: May shower and wash wound with dial antibacterial soap and water prior to dressing change. Cleanser: Wound Cleanser 1 x Per Week/30 Days Discharge Instructions: Cleanse the wound with wound cleanser prior to applying a clean dressing using gauze sponges, not  tissue or cotton balls. Peri-Wound Care: Skin Prep 1 x Per Week/30 Days Discharge Instructions: cavelon to macerated wound margins Peri-Wound Care: Sween Lotion (Moisturizing lotion) 1 x Per Week/30 Days Discharge Instructions: Apply moisturizing lotion as directed Prim Dressing: Maxorb Extra Ag+ Alginate Dressing, 4x4.75 (in/in) 1 x Per Week/30 Days ary Discharge Instructions: Apply to excoriated weeping areas Secondary Dressing: ABD Pad, 8x10 1 x Per Week/30 Days Discharge Instructions: Apply over primary dressing as directed. Com pression Wrap: ThreePress (3 layer compression wrap) 1 x Per Week/30 Days Discharge Instructions: Apply three layer compression as directed. Com pression Wrap: Unnaboot w/Calamine, 4x10 (in/yd) 1 x Per Week/30 Days Discharge Instructions: Apply Unnaboot on distal foot and top of lower leg WOUND #6: - Lower Leg Wound Laterality: Right, Anterior Cleanser: Soap and Water 1 x Per Week/30 Days Discharge Instructions: May shower and wash wound with dial antibacterial soap and water prior to dressing change. Cleanser: Wound Cleanser 1 x Per Week/30 Days Discharge Instructions: Cleanse the wound with wound cleanser prior to applying a clean dressing using gauze sponges, not tissue or cotton balls. Peri-Wound Care: Sween Lotion (Moisturizing lotion) 1 x Per Week/30 Days Discharge Instructions: Apply moisturizing lotion as directed Prim Dressing: Maxorb Extra Ag+ Alginate Dressing, 2x2 (in/in) 1 x Per Week/30 Days ary Discharge Instructions: Apply to wound bed as instructed Secondary Dressing: ABD Pad, 8x10 1 x Per Week/30 Days Discharge Instructions: Apply over primary dressing as directed. Com pression Wrap: ThreePress (3 layer compression wrap) 1 x Per Week/30 Days Discharge Instructions: Apply three layer compression as directed. 06/10/2023: He has had a new wound open on his left lower leg, secondary to friction from his compression wrap. The medial leg ulcers are  all smaller; the anterior wounds were measured larger, but this appears to be due to some minor moisture-related breakdown. He continues on levofloxacin. I used a curette to debride slough and subcutaneous tissue from each of the open wound surfaces. We will continue silver alginate to all site, including the new wound. Apply bilateral 3 layer wraps. Follow up in one week. Electronic Signature(s) Signed: 06/10/2023 12:12:01 PM By: Duanne Guess MD FACS Entered By: Duanne Guess on 06/10/2023 09:12:01 -------------------------------------------------------------------------------- HxROS Details Patient Name: Date of Service: [name], [name] 06/10/2023 11:00 A Barnie Del ([id number]) [id number] Page 16 of 18 Medical Record Number: [id number] Patient Account Number: [id number] Date of Birth/Sex: Treating RN: 1951-06-28 (72 y.o. M) Primary Care Provider: [name] Other Clinician: Referring Provider: Treating Provider/Extender: [name]  in Treatment: 69 Information Obtained From Patient Chart Constitutional Symptoms (General Health) Medical History: Past Medical History Notes: obesity Eyes Medical History: Positive for: Cataracts - right eye removed Negative for: Glaucoma; Optic Neuritis Ear/Nose/Mouth/Throat Medical History: Negative for: Chronic sinus problems/congestion; Middle ear problems Past Medical History Notes: hard of hearing Hematologic/Lymphatic Medical History: Positive for: Anemia - macrocytic; Lymphedema Respiratory Medical History: Past Medical History Notes: frozen diaphragm right Cardiovascular Medical History: Positive for: Congestive Heart Failure; Deep Vein Thrombosis - left leg; Hypertension; Peripheral Venous Disease Past Medical History Notes: hyperlipidemia Endocrine Medical History: Negative for: Type I Diabetes; Type II Diabetes Genitourinary Medical History: Negative for:  End Stage Renal Disease Integumentary (Skin) Medical History: Negative for: History of Burn Musculoskeletal Medical History: Past Medical History Notes: myositis, lumbar DDD, spinal stenosis, cervical facet joint syndrome Oncologic Medical History: Negative for: Received Chemotherapy; Received Radiation Psychiatric Medical History: Negative for: Anorexia/bulimia; Confinement Anxiety HBO Extended History Items ALMAN, NULL ([id number]) [id number] Page 17 of 18 Eyes: Cataracts Immunizations Pneumococcal Vaccine: Received Pneumococcal Vaccination: Yes Received Pneumococcal Vaccination On or After 60th Birthday: Yes Implantable Devices No devices added Hospitalization / Surgery History Type of Hospitalization/Surgery cervical fusion lumbar surgery coronary stent placement lasik eye surgery hydrocele excision Family and Social History Cancer: Yes - Mother; Diabetes: No; Heart Disease: Yes - Father,Mother; Hereditary Spherocytosis: No; Hypertension: Yes - Father,Siblings; Kidney Disease: No; Lung Disease: Yes - Siblings; Seizures: No; Stroke: No; Thyroid Problems: No; Tuberculosis: No; Former smoker - quit 1991; Marital Status - Married; Alcohol Use: Moderate; Drug Use: No History; Caffeine Use: Daily - coffee; Financial Concerns: No; Food, Clothing or Shelter Needs: No; Support System Lacking: No; Transportation Concerns: No Electronic Signature(s) Signed: 06/10/2023 12:51:55 PM By: Duanne Guess MD FACS Entered By: Duanne Guess on 06/10/2023 09:06:23 -------------------------------------------------------------------------------- SuperBill Details Patient Name: Date of Service: EMILY, KRELLER 06/10/2023 Medical Record Number: [id number] Patient Account Number: [id number] Date of Birth/Sex: Treating RN: June 09, 1951 (72 y.o. M) Primary Care Provider: [name] Other Clinician: Referring Provider: Treating Provider/Extender: [name] in Treatment: 69 Diagnosis Coding ICD-10 Codes Code Description [phone] Non-pressure chronic ulcer of other part of right lower leg with fat layer exposed L97.822 Non-pressure chronic ulcer of other part of left lower leg with fat layer exposed 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.0 Lymphedema, not elsewhere classified I25.10 Atherosclerotic heart disease of native coronary artery without angina pectoris I87.2 Venous insufficiency (chronic) (peripheral) Z86.718 Personal history of other venous thrombosis and embolism I10 Essential (primary) hypertension Facility Procedures : CPT4 Code: [id number] Description: 11042 - DEB SUBQ TISSUE 20 SQ CM/< ICD-10 Diagnosis Description L97.812 Non-pressure chronic ulcer of other part of right lower leg with fat layer exp L97.822 Non-pressure chronic ulcer of other part of left lower leg with fat layer expo Modifier: osed sed Quantity: 1 Physician Procedures : CPT4 Code Description Modifier [id number] - WC PHYS LEVEL 4 - EST PT SOHAIL, MILDER ([id number]) [id number] ICD-10 Diagnosis Description 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ed  L97.822 Non-pressure chronic ulcer of other part of left lower leg with fat layer exposed 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.0 Lymphedema, not elsewhere classified Quantity: 1 7.pdf Page 18 of 18 : [id number] 11042 - WC PHYS SUBQ TISS 20 SQ CM 1 ICD-10 Diagnosis Description 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ed L97.822 Non-pressure chronic ulcer of other part of left lower leg with fat layer  exposed Quantity: Electronic Signature(s) Signed:  06/10/2023 12:12:47 PM By: Duanne Guess MD FACS Entered By: Duanne Guess on 06/10/2023 09:12:47

## 2023-06-12 ENCOUNTER — Encounter: Payer: Self-pay | Admitting: Family Medicine

## 2023-06-12 NOTE — Progress Notes (Unsigned)
OFFICE VISIT  06/13/2023  CC:  Chief Complaint  Patient presents with   Medical Management of Chronic Issues    Pt c/o of sleep disturbance; pt states it takes an hour for him to fall asleep. Wants to discuss possible med.     Patient is a 72 y.o. adult who presents for 6 mo f/u HTN, HLD and bilat LE edema. A/P as of last visit: "1.  Inclusion body myositis. Diffuse weakness, only minimally ambulatory.  Power wheelchair use.   2.  Hypertension, well-controlled on atenolol 25 mg twice daily and lisinopril 10 mg daily. Renal function and electrolytes normal 3 months ago. No labs today--he will get these at his routine cardiology follow-up in July.   #3 hypercholesterolemia, stable on Zetia 10 mg a day and rosuvastatin 5 mg a day.   #4 history of unprovoked DVT. Eliquis 2.5 mg twice daily indefinitely.   5.  Bilateral lower extremity edema/venous insufficiency. History of right venous stasis ulcer. This is improving with wound clinic management. Lasix 40 mg daily as needed."  INTERIM HX: For many years he has had some sleep dysfunction. Takes about an hour to initiate sleep.  He gets up typically 2 times to go to the bathroom and each time it takes a long time to get back to sleep.  He takes Benadryl as needed and it does help significantly. He was wondering about anything else that he should be taking.  He has some days where his urinary stream is weak and he seems to feel like he cannot empty quite as well as usual.  However, most days he empties well and his stream is good.  He says he was put on Flomax in the past and used it on a as needed basis and felt like it helped a lot.  He is still getting attended to by the wound clinic for lower extremity venous stasis ulcers.  ROS as above, plus--> no fevers, no CP, no SOB, no wheezing, no cough, no dizziness, no HAs, no rashes, no melena/hematochezia.  No polyuria or polydipsia.  No myalgias or arthralgias.  No paresthesias or  tremors.  No acute vision or hearing abnormalities.  No dysuria or unusual/new urinary urgency or frequency.   No n/v/d or abd pain.  Still having occasional palpitations.    Past Medical History:  Diagnosis Date   Adenomatous colon polyp - diminutive 06/2019   no recall needed   Bilateral lower extremity edema    Coronary artery disease    a. s/p BMS to LAD (1998) b. s/p DES to RCA (06/24/14)   DVT (deep venous thrombosis) (HCC)    12/2020 L femoral and poplitea.  Unprovoked.  hypercoag w/u NEG.   Eliquis.   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 ?inclusion body myositis? Power WC as of 2022   Macrocytic anemia    Estab with cone hem/onc 08/2022   Osteoarthritis, multiple sites    Palatal fistula    Palpable abd. aorta    Aortic u/s NO ANEURISM 2020   RBBB    Spinal stenosis    Dr Louanna Raw  surgery   Thrombocytopenia (HCC)  Past Surgical History:  Procedure Laterality Date   APPENDECTOMY  2010   CERVICAL FUSION  09/2009   CHOLECYSTECTOMY  1998   COLONOSCOPY     2020-->no recall   EYE SURGERY     Lasik   HYDROCELE EXCISION  2005   LEFT HEART CATHETERIZATION WITH CORONARY ANGIOGRAM N/A 06/24/2014   Procedure: LEFT HEART CATHETERIZATION WITH CORONARY ANGIOGRAM;  Surgeon: Lennette Bihari, MD;  Location: [facility] CATH LAB;  Service: Cardiovascular;  Laterality: N/A;   LUMBAR SPINE SURGERY  11/2011   MUSCLE BIOPSY Left 07/11/2018   Procedure: Left Vastus lateralus muscle biopsy;  Surgeon: Jadene Pierini, MD;  Location: [facility] OR;  Service: Neurosurgery;  Laterality: Left;  Left Vastus lateralus muscle biopsy   PERCUTANEOUS CORONARY STENT INTERVENTION (PCI-S)  06/24/2014   DES to RCA. Procedure: PERCUTANEOUS CORONARY  STENT INTERVENTION (PCI-S);  Surgeon: Lennette Bihari, MD;  Location: [facility] CATH LAB;  Service: Cardiovascular;;  Distal RCA   Rhythm monitoring     04/2023, occ pvc's   TONSILLECTOMY AND ADENOIDECTOMY  2012   TOTAL HIP ARTHROPLASTY Right 2021   RIGHT   TRANSTHORACIC ECHOCARDIOGRAM     03/2019 normal.  Nov 2024 mild RV dysf, o/w normal    Outpatient Medications Prior to Visit  Medication Sig Dispense Refill   acetaminophen (TYLENOL) 500 MG tablet Take by mouth as needed for moderate pain or mild pain.     apixaban (ELIQUIS) 2.5 MG TABS tablet Take 1 tablet (2.5 mg total) by mouth 2 (two) times daily. 180 tablet 1   atenolol (TENORMIN) 25 MG tablet Take 1 tablet (25 mg total) by mouth 2 (two) times daily. 180 tablet 3   b complex vitamins tablet Take 1 tablet by mouth daily.     cholecalciferol (VITAMIN D) 1000 units tablet Take 1,000 Units by mouth daily.     Coenzyme Q10 (CO Q 10) 100 MG CAPS Take 100 mg by mouth daily.      ezetimibe (ZETIA) 10 MG tablet Take 1 tablet (10 mg total) by mouth daily. 90 tablet 0   folic acid (FOLVITE) 800 MCG tablet Take 800 mcg by mouth daily.      furosemide (LASIX) 40 MG tablet Take 1 tablet (40 mg total) by mouth daily as needed. 90 tablet 3   glucosamine-chondroitin 500-400 MG tablet Take 2 tablets by mouth daily.      ibuprofen (ADVIL,MOTRIN) 800 MG tablet Take 800 mg by mouth daily.      Krill Oil 500 MG CAPS Take 500 mg by mouth daily.      levofloxacin (LEVAQUIN) 750 MG tablet Take 750 mg by mouth daily.     lisinopril (ZESTRIL) 10 MG tablet Take 1 tablet (10 mg total) by mouth daily. 90 tablet 3   Melatonin 10 MG TABS Take 20 mg by mouth at bedtime.     potassium chloride (K-DUR) 10 MEQ tablet TAKE 1 TABLET BY MOUTH FOR 3 DAYS THEN TAKE ONLY AS NEEDED WHEN YOU TAKE A LASIX 90 tablet 3   rosuvastatin (CRESTOR) 5 MG tablet Take 1 tablet (5 mg total) by mouth daily. 90 tablet 0   Saw Palmetto, Serenoa repens, (SAW PALMETTO PO) Take 300 mg by mouth  daily.     triamcinolone cream (KENALOG) 0.1 % Apply 1 application topically as directed. 30 g 3   No facility-administered medications prior to visit.    No Known Allergies  Review of Systems As per HPI  PE:    06/13/2023  9:57 AM 05/05/2023    1:58 PM 03/04/2023    2:06 PM  Vitals with BMI  Height  5' 9.5"   Weight 286 lbs 13 oz 285 lbs 289 lbs 11 oz  BMI  41.5   Systolic 131 120 161  Diastolic 63 62 64  Pulse 63 80 78     Physical Exam  General: Sitting in wheelchair, alert, appears well, lucid thought and speech. Cardiovascular: Regular rhythm and rate without murmur or rub.  LABS:  Last CBC Lab Results  Component Value Date   WBC 6.3 05/30/2023   HGB 13.2 05/30/2023   HCT 39.2 05/30/2023   MCV 102 (H) 05/30/2023   MCH 34.4 (H) 05/30/2023   RDW 12.7 05/30/2023   PLT 85 (LL) 05/30/2023   Last metabolic panel Lab Results  Component Value Date   GLUCOSE 81 05/30/2023   NA 144 05/30/2023   K 4.5 05/30/2023   CL 103 05/30/2023   CO2 25 05/30/2023   BUN 17 05/30/2023   CREATININE 0.91 05/30/2023   EGFR 88 05/30/2023   CALCIUM 9.2 05/30/2023   PROT 6.7 03/04/2023   ALBUMIN 3.7 03/04/2023   LABGLOB 2.9 03/04/2023   AGRATIO 1.6 12/31/2020   BILITOT 0.4 03/04/2023   ALKPHOS 56 03/04/2023   AST 23 03/04/2023   ALT 18 03/04/2023   ANIONGAP 5 03/04/2023   Last lipids Lab Results  Component Value Date   CHOL 116 12/31/2020   HDL 28 (L) 12/31/2020   LDLCALC 70 12/31/2020   LDLDIRECT 65 05/16/2019   TRIG 92 12/31/2020   CHOLHDL 4.1 12/31/2020   Last hemoglobin A1c Lab Results  Component Value Date   HGBA1C 5.3 05/05/2023   Last vitamin B12 and Folate Lab Results  Component Value Date   VITAMINB12 315 03/12/2022   FOLATE 38.2 03/12/2022   IMPRESSION AND PLAN:  #1 sleep dysfunction.  A lot of this is a result of nocturia. Unfortunately he does have to take Lasix regularly for his lower extremity edema. Benadryl helps sufficiently so he will  continue with this.  #2 BPH with waxing and waning lower urinary tract obstructive symptoms. Since he states Flomax on a as needed basis was very helpful in the past I will go ahead and prescribe this 0.4 mg. Therapeutic expectations and side effect profile of medication discussed today.  Patient's questions answered.  #3 hypertension, doing well on a atenolol 25 mg twice daily and lisinopril 10 mg a day. Electrolytes and creatinine normal earlier this month.  4. hypercholesterolemia, stable on Zetia 10 mg a day and rosuvastatin 5 mg a day. LDL was 70 last month at cardiology.  5. Inclusion body myositis. Diffuse weakness, only minimally ambulatory.  Power wheelchair use.  An After Visit Summary was printed and given to the patient.  FOLLOW UP: Return in about 6 months (around 12/11/2023) for routine chronic illness f/u.  Signed:  Santiago Bumpers, MD           06/13/2023

## 2023-06-13 ENCOUNTER — Encounter: Payer: Self-pay | Admitting: Family Medicine

## 2023-06-13 ENCOUNTER — Ambulatory Visit (INDEPENDENT_AMBULATORY_CARE_PROVIDER_SITE_OTHER): Payer: Medicare Other | Admitting: Family Medicine

## 2023-06-13 VITALS — BP 131/63 | HR 63 | Wt 286.8 lb

## 2023-06-13 DIAGNOSIS — G478 Other sleep disorders: Secondary | ICD-10-CM

## 2023-06-13 DIAGNOSIS — E7800 Pure hypercholesterolemia, unspecified: Secondary | ICD-10-CM

## 2023-06-13 DIAGNOSIS — N401 Enlarged prostate with lower urinary tract symptoms: Secondary | ICD-10-CM

## 2023-06-13 DIAGNOSIS — I10 Essential (primary) hypertension: Secondary | ICD-10-CM

## 2023-06-13 DIAGNOSIS — Z23 Encounter for immunization: Secondary | ICD-10-CM | POA: Diagnosis not present

## 2023-06-13 MED ORDER — TAMSULOSIN HCL 0.4 MG PO CAPS: 0.4000 mg | ORAL_CAPSULE | Freq: Every day | ORAL | 1 refills | Status: DC

## 2023-06-14 ENCOUNTER — Other Ambulatory Visit: Payer: Self-pay

## 2023-06-14 ENCOUNTER — Telehealth: Payer: Self-pay

## 2023-06-14 DIAGNOSIS — R600 Localized edema: Secondary | ICD-10-CM

## 2023-06-14 DIAGNOSIS — I82499 Acute embolism and thrombosis of other specified deep vein of unspecified lower extremity: Secondary | ICD-10-CM

## 2023-06-14 DIAGNOSIS — I10 Essential (primary) hypertension: Secondary | ICD-10-CM

## 2023-06-14 DIAGNOSIS — Z86718 Personal history of other venous thrombosis and embolism: Secondary | ICD-10-CM

## 2023-06-14 DIAGNOSIS — Z79899 Other long term (current) drug therapy: Secondary | ICD-10-CM

## 2023-06-14 DIAGNOSIS — R0602 Shortness of breath: Secondary | ICD-10-CM

## 2023-06-14 MED ORDER — FUROSEMIDE 40 MG PO TABS: 40.0000 mg | ORAL_TABLET | ORAL | Status: DC

## 2023-06-14 MED ORDER — POTASSIUM CHLORIDE ER 10 MEQ PO TBCR: 10.0000 meq | EXTENDED_RELEASE_TABLET | ORAL | Status: DC

## 2023-06-14 NOTE — Telephone Encounter (Signed)
-----   Message from [place] sent at 06/09/2023 10:33 PM EST ----- CBC Hgb is normal   Plateltes are a little low   Will need to follow over time  Electrolytes and kidney function are normal I would take lasix 40 mg every other day  Take KCL when doing that  Check BMET and BNP in 2 wks

## 2023-06-14 NOTE — Telephone Encounter (Signed)
Pt advised and will have labs 06/27/23.

## 2023-06-14 NOTE — Telephone Encounter (Signed)
-----   Message from [place] sent at 06/09/2023 10:32 PM EST ----- Pumping function of the Left ventricle is normal The right ventricular function appears mildly down  1.  Given hx DVT, get high resolution CT of chest rule out chronic PE 2   Does he have sleep apnea  would test if hasn't been tested

## 2023-06-14 NOTE — Telephone Encounter (Signed)
Pt agrees to having a CT but declines a sleep study for now until after his CT.. he says he does not snore but he is very tired throughout the day which I advised him can be a symptoms of sleep apnea but he says he wishes to wait for now.

## 2023-06-15 ENCOUNTER — Encounter: Payer: Self-pay | Admitting: Internal Medicine

## 2023-06-17 ENCOUNTER — Encounter (HOSPITAL_BASED_OUTPATIENT_CLINIC_OR_DEPARTMENT_OTHER): Payer: Medicare Other | Admitting: General Surgery

## 2023-06-17 DIAGNOSIS — L97818 Non-pressure chronic ulcer of other part of right lower leg with other specified severity: Secondary | ICD-10-CM | POA: Diagnosis not present

## 2023-06-17 DIAGNOSIS — L97512 Non-pressure chronic ulcer of other part of right foot with fat layer exposed: Secondary | ICD-10-CM | POA: Diagnosis not present

## 2023-06-17 DIAGNOSIS — L97812 Non-pressure chronic ulcer of other part of right lower leg with fat layer exposed: Secondary | ICD-10-CM | POA: Diagnosis not present

## 2023-06-17 DIAGNOSIS — L97822 Non-pressure chronic ulcer of other part of left lower leg with fat layer exposed: Secondary | ICD-10-CM | POA: Diagnosis not present

## 2023-06-17 DIAGNOSIS — I110 Hypertensive heart disease with heart failure: Secondary | ICD-10-CM | POA: Diagnosis not present

## 2023-06-17 DIAGNOSIS — I890 Lymphedema, not elsewhere classified: Secondary | ICD-10-CM | POA: Diagnosis not present

## 2023-06-17 DIAGNOSIS — L942 Calcinosis cutis: Secondary | ICD-10-CM | POA: Diagnosis not present

## 2023-06-17 DIAGNOSIS — I509 Heart failure, unspecified: Secondary | ICD-10-CM | POA: Diagnosis not present

## 2023-06-17 NOTE — Progress Notes (Signed)
[name], [name] ([id number]) [id number] Page 1 of 13 Visit Report for 06/17/2023 Arrival Information Details Patient Name: Date of Service: [name], [name] 06/17/2023 11:00 A M Medical Record Number: [id number] Patient Account Number: [id number] Date of Birth/Sex: Treating RN: Jul 18, 1951 (72 y.o. Marlan [name] Primary Care Mahlia Fernando: [name] Other Clinician: Referring Flannery Cavallero: Treating Abrie Egloff/Extender: [name] in Treatment: 16 Visit Information History Since Last Visit Added or deleted any medications: No Patient Arrived: Wheel Chair Any new allergies or adverse reactions: No Arrival Time: 11:14 Had a fall or experienced change in No Accompanied By: self activities of daily living that may affect Transfer Assistance: Manual risk of falls: Patient Identification Verified: Yes Signs or symptoms of abuse/neglect since last visito No Secondary Verification Process Completed: Yes Hospitalized since last visit: No Patient Requires Transmission-Based Precautions: No Implantable device outside of the clinic excluding No Patient Has Alerts: Yes cellular tissue based products placed in the center Patient Alerts: R ABI= N/C, TBI= .75 since last visit: L ABI =N/C, TBI = .57 Has Dressing in Place as Prescribed: Yes Has Compression in Place as Prescribed: Yes Pain Present Now: No Electronic Signature(s) Signed: 06/17/2023 12:12:43 PM By: [name] Entered By: [name] on 06/17/2023 11:15:32 -------------------------------------------------------------------------------- Compression Therapy Details Patient Name: Date of Service: [name], [name] 06/17/2023 11:00 A M Medical Record Number: [id number] Patient Account Number: [id number] Date of Birth/Sex: Treating RN: [birth year] (72 y.o. [name] Primary Care Bertie Mcconathy: [name] Other Clinician: Referring Parris Cudworth: Treating  Vy Badley/Extender: [name] in Treatment: 57 Compression Therapy Performed for Wound Assessment: Wound #3 Right,Medial Lower Leg Performed By: Clinician Zenaida Deed, RN Compression Type: Three Layer Post Procedure Diagnosis Same as Pre-procedure Electronic Signature(s) Signed: 06/17/2023 1:02:55 PM By: Zenaida Deed RN, BSN Entered By: Zenaida Deed on 06/17/2023 11:44:20 [name] ([id number]) [id number] Page 2 of 13 -------------------------------------------------------------------------------- Compression Therapy Details Patient Name: Date of Service: [name], [name] 06/17/2023 11:00 A M Medical Record Number: [id number] Patient Account Number: [id number] Date of Birth/Sex: Treating RN: 1950-11-25 (72 y.o. [name] Primary Care [name]: [name] Other Clinician: Referring [name]: Treating [name]/Extender: [name] in Treatment: 27 Compression Therapy Performed for Wound Assessment: Wound #12 Left,Anterior Lower Leg Performed By: Clinician Zenaida Deed, RN Compression Type: Three Layer Post Procedure Diagnosis Same as Pre-procedure Electronic Signature(s) Signed: 06/17/2023 1:02:55 PM By: Zenaida Deed RN, BSN Entered By: Zenaida Deed on 06/17/2023 11:44:20 -------------------------------------------------------------------------------- Encounter Discharge Information Details Patient Name: Date of Service: [name], [name] 06/17/2023 11:00 A M Medical Record Number: [id number] Patient Account Number: [id number] Date of Birth/Sex: Treating RN: [birth year] (72 y.o. Marlan [name] Primary Care Makaylee Spielberg: [name] Other Clinician: Referring [name]: Treating [name]/Extender: [name] in Treatment: 106 Encounter Discharge Information Items Post Procedure Vitals Discharge Condition: Stable Temperature (F):  97.5 Ambulatory Status: Wheelchair Pulse (bpm): 66 Discharge Destination: Home Respiratory Rate (breaths/min): 16 Transportation: Private Auto Blood Pressure (mmHg): 131/77 Accompanied By: self Schedule Follow-up Appointment: Yes Clinical Summary of Care: Patient Declined Electronic Signature(s) Signed: 06/17/2023 12:12:43 PM By: [name] Entered By: [name] on 06/17/2023 12:11:07 -------------------------------------------------------------------------------- Lower Extremity Assessment Details Patient Name: Date of Service: [name], [name] 06/17/2023 11:00 A M Medical Record Number: [id number] Patient Account Number: [id number] Date of Birth/Sex: Treating RN: 1951/07/19 (72 y.o. Marlan [name] Primary Care [name]: [name] Other Clinician: Referring [name]: Treating [name]/Extender: [name] in Treatment: 70 Edema Assessment Assessed: [Left: No] [Right: No]  Edema: [Left: Yes] [Right: Yes] Calf ZY, HEDGLIN ([id number]) [id number] Page 3 of 13 Left: Right: Point of Measurement: From Medial Instep 40.5 cm 44.5 cm Ankle Left: Right: Point of Measurement: From Medial Instep 24.2 cm 27.5 cm Vascular Assessment Pulses: Dorsalis Pedis Palpable: [Left:Yes] [Right:Yes] Extremity colors, hair growth, and conditions: Extremity Color: [Left:Hyperpigmented] [Right:Hyperpigmented] Hair Growth on Extremity: [Left:Yes] [Right:Yes] Temperature of Extremity: [Left:Warm] [Right:Warm] Capillary Refill: [Left:< 3 seconds] [Right:< 3 seconds] Dependent Rubor: [Left:No Yes] [Right:No Yes] Electronic Signature(s) Signed: 06/17/2023 12:12:43 PM By: [name] Entered By: [name] on 06/17/2023 11:23:39 -------------------------------------------------------------------------------- Multi Wound Chart Details Patient Name: Date of Service: [name] 06/17/2023 11:00 A M Medical  Record Number: [id number] Patient Account Number: [id number] Date of Birth/Sex: Treating RN: [birth year] (72 y.o. M) Primary Care [name]: [name] Other Clinician: Referring Berry Gallacher: Treating Shaniah Baltes/Extender: [name] in Treatment: 53 Vital Signs Height(in): 71 Pulse(bpm): 66 Weight(lbs): 267 Blood Pressure(mmHg): 131/77 Body Mass Index(BMI): 37.2 Temperature(F): 97.5 Respiratory Rate(breaths/min): 16 [12:Photos:] Left, Anterior Lower Leg Right, Medial Lower Leg Right, Anterior Lower Leg Wound Location: Gradually Appeared Gradually Appeared Gradually Appeared Wounding Event: Lymphedema Lymphedema Calcinosis Primary Etiology: Venous Leg Ulcer N/A N/A Secondary Etiology: Cataracts, Anemia, Lymphedema, Cataracts, Anemia, Lymphedema, Cataracts, Anemia, Lymphedema, Comorbid History: Congestive Heart Failure, Deep Vein Congestive Heart Failure, Deep Vein Congestive Heart Failure, Deep Vein Thrombosis, Hypertension, Peripheral Thrombosis, Hypertension, Peripheral Thrombosis, Hypertension, Peripheral Venous Disease Venous Disease Venous Disease 05/27/2023 12/07/2021 05/12/2022 Date Acquired: 1 70 57 Weeks of Treatment: Open Open Open Wound Status: No No No Wound Recurrence: No No Yes Clustered Wound: N/A N/A 1 Clustered Quantity: 0x0x0 3x4x0.2 0.2x0.6x0.2 Measurements L x W x D (cm) 0 9.425 0.094 A (cm) : Willis Modena ([id number]) [id number] Page 4 of 13 0 1.885 0.019 Volume (cm) : 100.00% -76.50% 60.20% % Reduction in Area: 100.00% 11.80% 20.80% % Reduction in Volume: Full Thickness Without Exposed Full Thickness With Exposed Support Full Thickness Without Exposed Classification: Support Structures Structures Support Structures None Present Medium Small Exudate A mount: N/A Serous Serous Exudate Type: N/A amber amber Exudate Color: Distinct, outline attached Flat and Intact Epibole Wound  Margin: None Present (0%) Small (1-33%) Small (1-33%) Granulation A mount: N/A Red Pink Granulation Quality: None Present (0%) Large (67-100%) Large (67-100%) Necrotic A mount: N/A Eschar, Adherent Slough N/A Necrotic Tissue: Fascia: No Fat Layer (Subcutaneous Tissue): Yes Fat Layer (Subcutaneous Tissue): Yes Exposed Structures: Fat Layer (Subcutaneous Tissue): No Fascia: No Fascia: No Tendon: No Tendon: No Tendon: No Muscle: No Muscle: No Muscle: No Joint: No Joint: No Joint: No Bone: No Bone: No Bone: No Large (67-100%) Medium (34-66%) Large (67-100%) Epithelialization: Debridement - Selective/Open Wound Debridement - Excisional N/A Debridement: Pre-procedure Verification/Time Out 11:40 11:40 N/A Taken: Lidocaine 4% Topical Solution Lidocaine 4% Topical Solution N/A Pain Control: Necrotic/Eschar Necrotic/Eschar, Subcutaneous, N/A Tissue Debrided: Slough Non-Viable Tissue Skin/Subcutaneous Tissue N/A Level: 0.03 7.06 N/A Debridement A (sq cm): rea Curette Curette N/A Instrument: Minimum Minimum N/A Bleeding: Pressure Pressure N/A Hemostasis Achieved: 0 0 N/A Procedural Pain: 0 0 N/A Post Procedural Pain: Procedure was tolerated well Procedure was tolerated well N/A Debridement Treatment Response: 0.2x0.2x0.1 3x4x0.2 N/A Post Debridement Measurements L x W x D (cm) 0.003 1.885 N/A Post Debridement Volume: (cm) Excoriation: No Excoriation: No Excoriation: No Periwound Skin Texture: Induration: No Induration: No Callus: No Callus: No Crepitus: No Crepitus: No Rash: No Rash: No Scarring: No Scarring: No Maceration: No Maceration: Yes Maceration: No Periwound Skin Moisture: Dry/Scaly: No Dry/Scaly:  No Atrophie Blanche: No Hemosiderin Staining: Yes Hemosiderin Staining: Yes Periwound Skin Color: Cyanosis: No Rubor: Yes Atrophie Blanche: No Ecchymosis: No Erythema: No Cyanosis: No Erythema: No Ecchymosis: No Hemosiderin Staining:  No Erythema: No Mottled: No Mottled: No Pallor: No Pallor: No Rubor: No Rubor: No No Abnormality No Abnormality No Abnormality Temperature: Compression Therapy Compression Therapy N/A Procedures Performed: Debridement Debridement Treatment Notes Wound #12 (Lower Leg) Wound Laterality: Left, Anterior Cleanser Soap and Water Discharge Instruction: May shower and wash wound with dial antibacterial soap and water prior to dressing change. Wound Cleanser Discharge Instruction: Cleanse the wound with wound cleanser prior to applying a clean dressing using gauze sponges, not tissue or cotton balls. Peri-Wound Care Sween Lotion (Moisturizing lotion) Discharge Instruction: Apply moisturizing lotion as directed Topical Primary Dressing Maxorb Extra Ag+ Alginate Dressing, 2x2 (in/in) Discharge Instruction: Apply to wound bed as instructed Secondary Dressing ABD Pad, 8x10 Discharge Instruction: Apply over primary dressing as directed. Secured With [place], [name] ([id number]) [id number] Page 5 of 13 Compression Wrap ThreePress (3 layer compression wrap) Discharge Instruction: Apply three layer compression as directed. Compression Stockings Add-Ons Wound #3 (Lower Leg) Wound Laterality: Right, Medial Cleanser Soap and Water Discharge Instruction: May shower and wash wound with dial antibacterial soap and water prior to dressing change. Wound Cleanser Discharge Instruction: Cleanse the wound with wound cleanser prior to applying a clean dressing using gauze sponges, not tissue or cotton balls. Peri-Wound Care Skin Prep Discharge Instruction: cavelon to macerated wound margins Sween Lotion (Moisturizing lotion) Discharge Instruction: Apply moisturizing lotion as directed Topical Primary Dressing Maxorb Extra Ag+ Alginate Dressing, 4x4.75 (in/in) Discharge Instruction: Apply to excoriated weeping areas Secondary Dressing ABD Pad, 8x10 Discharge  Instruction: Apply over primary dressing as directed. Secured With Compression Wrap ThreePress (3 layer compression wrap) Discharge Instruction: Apply three layer compression as directed. Unnaboot w/Calamine, 4x10 (in/yd) Discharge Instruction: Apply Unnaboot on distal foot and top of lower leg Compression Stockings Add-Ons Wound #6 (Lower Leg) Wound Laterality: Right, Anterior Cleanser Soap and Water Discharge Instruction: May shower and wash wound with dial antibacterial soap and water prior to dressing change. Wound Cleanser Discharge Instruction: Cleanse the wound with wound cleanser prior to applying a clean dressing using gauze sponges, not tissue or cotton balls. Peri-Wound Care Sween Lotion (Moisturizing lotion) Discharge Instruction: Apply moisturizing lotion as directed Topical Primary Dressing Maxorb Extra Ag+ Alginate Dressing, 2x2 (in/in) Discharge Instruction: Apply to wound bed as instructed Secondary Dressing ABD Pad, 8x10 Discharge Instruction: Apply over primary dressing as directed. Secured With Compression Wrap ThreePress (3 layer compression wrap) Discharge Instruction: Apply three layer compression as directed. Compression Stockings DAWSON, TWEED ([id number]) [id number] Page 6 of 13 Add-Ons Electronic Signature(s) Signed: 06/17/2023 12:23:10 PM By: Duanne Guess MD FACS Entered By: Duanne Guess on 06/17/2023 12:23:10 -------------------------------------------------------------------------------- Multi-Disciplinary Care Plan Details Patient Name: Date of Service: [name], YAHR 06/17/2023 11:00 A M Medical Record Number: [id number] Patient Account Number: [id number] Date of Birth/Sex: Treating RN: Oct 21, 1950 (72 y.o. [name] Primary Care Xaviar Lunn: [name] Other Clinician: Referring Ivey Cina: Treating Tammey Deeg/Extender: [name] in Treatment: 4 Multidisciplinary Care Plan  reviewed with physician Active Inactive Venous Leg Ulcer Nursing Diagnoses: Knowledge deficit related to disease process and management Potential for venous Insuffiency (use before diagnosis confirmed) Goals: Patient will maintain optimal edema control Date Initiated: 02/17/2022 Target Resolution Date: 06/24/2023 Goal Status: Active Interventions: Assess peripheral edema status every visit. Compression as ordered Provide education on venous insufficiency Treatment Activities: Non-invasive vascular studies : 02/17/2022  Therapeutic compression applied : 02/17/2022 Notes: Wound/Skin Impairment Nursing Diagnoses: Impaired tissue integrity Knowledge deficit related to ulceration/compromised skin integrity Goals: Patient/caregiver will verbalize understanding of skin care regimen Date Initiated: 02/17/2022 Target Resolution Date: 06/24/2023 Goal Status: Active Ulcer/skin breakdown will have a volume reduction of 30% by week 4 Date Initiated: 02/17/2022 Date Inactivated: 03/10/2022 Target Resolution Date: 03/10/2022 Unmet Reason: infection being treated, Goal Status: Unmet waiting on derm appte Ulcer/skin breakdown will have a volume reduction of 50% by week 8 Date Initiated: 03/10/2022 Date Inactivated: 04/14/2022 Target Resolution Date: 04/07/2022 Goal Status: Unmet Unmet Reason: calcinosis Ulcer/skin breakdown will have a volume reduction of 80% by week 12 Date Initiated: 04/14/2022 Date Inactivated: 05/05/2022 Target Resolution Date: 05/05/2022 Unmet Reason: infection, chronic Goal Status: Unmet condition Interventions: Assess patient/caregiver ability to obtain necessary supplies [place], [name] ([id number]) [id number] Page 7 of 13 Assess patient/caregiver ability to perform ulcer/skin care regimen upon admission and as needed Assess ulceration(s) every visit Provide education on ulcer and skin care Treatment Activities: Skin care regimen initiated :  02/17/2022 Topical wound management initiated : 02/17/2022 Notes: Electronic Signature(s) Signed: 06/17/2023 1:02:55 PM By: Zenaida Deed RN, BSN Entered By: Zenaida Deed on 06/17/2023 11:45:22 -------------------------------------------------------------------------------- Pain Assessment Details Patient Name: Date of Service: IDA, VENKATARAMAN 06/17/2023 11:00 A M Medical Record Number: [id number] Patient Account Number: [id number] Date of Birth/Sex: Treating RN: 02-28-1951 (72 y.o. Marlan [name] Primary Care Linnie Mcglocklin: [name] Other Clinician: Referring Edyn Qazi: Treating Nikhita Mentzel/Extender: [name] in Treatment: 29 Active Problems Location of Pain Severity and Description of Pain Patient Has Paino No Site Locations Rate the pain. Current Pain Level: 0 Pain Management and Medication Current Pain Management: Electronic Signature(s) Signed: 06/17/2023 12:12:43 PM By: [name] Entered By: [name] on 06/17/2023 11:15:55 -------------------------------------------------------------------------------- Patient/Caregiver Education Details Patient Name: Date of Service: Coates, [name] 11/22/2024andnbsp11:00 A M Medical Record Number: [id number] Patient Account Number: [id number] SHAQUEZ, GIKAS ([id number]) [id number] Page 8 of 13 Date of Birth/Gender: Treating RN: Dec 01, 1950 (72 y.o. [name] Primary Care Physician: [name] Other Clinician: Referring Physician: Treating Physician/Extender: [name] in Treatment: 22 Education Assessment Education Provided To: Patient Education Topics Provided Venous: Methods: Explain/Verbal Responses: Reinforcements needed, State content correctly Electronic Signature(s) Signed: 06/17/2023 1:02:55 PM By: Zenaida Deed RN, BSN Entered By: Zenaida Deed on 06/17/2023  12:13:11 -------------------------------------------------------------------------------- Wound Assessment Details Patient Name: Date of Service: MASCUD, WHICHARD 06/17/2023 11:00 A M Medical Record Number: [id number] Patient Account Number: [id number] Date of Birth/Sex: Treating RN: [birth year] (72 y.o. Marlan [name] Primary Care Gail Vendetti: [name] Other Clinician: Referring Aalyiah Camberos: Treating Lawson Mahone/Extender: [name] in Treatment: 70 Wound Status Wound Number: 12 Primary Lymphedema Etiology: Wound Location: Left, Anterior Lower Leg Secondary Venous Leg Ulcer Wounding Event: Gradually Appeared Etiology: Date Acquired: 05/27/2023 Wound Open Weeks Of Treatment: 1 Status: Clustered Wound: No Comorbid Cataracts, Anemia, Lymphedema, Congestive Heart Failure, Deep History: Vein Thrombosis, Hypertension, Peripheral Venous Disease Photos Wound Measurements Length: (cm) Width: (cm) Depth: (cm) Area: (cm) Volume: (cm) 0 % Reduction in Area: 100% 0 % Reduction in Volume: 100% 0 Epithelialization: Large (67-100%) 0 Tunneling: No 0 Undermining: No Wound Description Classification: Full Thickness Without Exposed Support Wound Margin: Distinct, outline attached Exudate Amount: None Present Baray, Parminder ([id number]) Wound Bed Granulation Amount: None Present (0% Necrotic Amount: None Present (0% Structures Foul Odor After Cleansing: No Slough/Fibrino No [id number] Page 9 of 13 ) Exposed Structure ) Fascia Exposed: No Fat Layer (Subcutaneous Tissue) Exposed:  No Tendon Exposed: No Muscle Exposed: No Joint Exposed: No Bone Exposed: No Periwound Skin Texture Texture Color No Abnormalities Noted: No No Abnormalities Noted: No Callus: No Atrophie Blanche: No Crepitus: No Cyanosis: No Excoriation: No Ecchymosis: No Induration: No Erythema: No Rash: No Hemosiderin Staining: No Scarring: No Mottled:  No Pallor: No Moisture Rubor: No No Abnormalities Noted: No Dry / Scaly: No Temperature / Pain Maceration: No Temperature: No Abnormality Treatment Notes Wound #12 (Lower Leg) Wound Laterality: Left, Anterior Cleanser Soap and Water Discharge Instruction: May shower and wash wound with dial antibacterial soap and water prior to dressing change. Wound Cleanser Discharge Instruction: Cleanse the wound with wound cleanser prior to applying a clean dressing using gauze sponges, not tissue or cotton balls. Peri-Wound Care Sween Lotion (Moisturizing lotion) Discharge Instruction: Apply moisturizing lotion as directed Topical Primary Dressing Maxorb Extra Ag+ Alginate Dressing, 2x2 (in/in) Discharge Instruction: Apply to wound bed as instructed Secondary Dressing ABD Pad, 8x10 Discharge Instruction: Apply over primary dressing as directed. Secured With Compression Wrap ThreePress (3 layer compression wrap) Discharge Instruction: Apply three layer compression as directed. Compression Stockings Add-Ons Electronic Signature(s) Signed: 06/17/2023 12:12:43 PM By: [name] Entered By: [name] on 06/17/2023 11:28:06 -------------------------------------------------------------------------------- Wound Assessment Details Patient Name: Date of Service: [name], [name] 06/17/2023 11:00 A M Medical Record Number: [id number] Patient Account Number: [id number] Date of Birth/Sex: Treating RN: 1951/06/01 (72 y.o. Marlan [name] [place], [name] ([id number]) [id number] Page 10 of 13 Primary Care Shawni Volkov: [name] Other Clinician: Referring Lanelle Lindo: Treating Hameed Kolar/Extender: [name] in Treatment: 70 Wound Status Wound Number: 3 Primary Lymphedema Etiology: Wound Location: Right, Medial Lower Leg Wound Open Wounding Event: Gradually Appeared Status: Date Acquired: 12/07/2021 Comorbid Cataracts,  Anemia, Lymphedema, Congestive Heart Failure, Deep Weeks Of Treatment: 70 History: Vein Thrombosis, Hypertension, Peripheral Venous Disease Clustered Wound: No Photos Wound Measurements Length: (cm) 3 Width: (cm) 4 Depth: (cm) 0.2 Area: (cm) 9.425 Volume: (cm) 1.885 % Reduction in Area: -76.5% % Reduction in Volume: 11.8% Epithelialization: Medium (34-66%) Tunneling: No Undermining: No Wound Description Classification: Full Thickness With Exposed Support Structures Wound Margin: Flat and Intact Exudate Amount: Medium Exudate Type: Serous Exudate Color: amber Foul Odor After Cleansing: No Slough/Fibrino Yes Wound Bed Granulation Amount: Small (1-33%) Exposed Structure Granulation Quality: Red Fascia Exposed: No Necrotic Amount: Large (67-100%) Fat Layer (Subcutaneous Tissue) Exposed: Yes Necrotic Quality: Eschar, Adherent Slough Tendon Exposed: No Muscle Exposed: No Joint Exposed: No Bone Exposed: No Periwound Skin Texture Texture Color No Abnormalities Noted: Yes No Abnormalities Noted: No Erythema: No Moisture Hemosiderin Staining: Yes No Abnormalities Noted: Yes Rubor: Yes Temperature / Pain Temperature: No Abnormality Treatment Notes Wound #3 (Lower Leg) Wound Laterality: Right, Medial Cleanser Soap and Water Discharge Instruction: May shower and wash wound with dial antibacterial soap and water prior to dressing change. Wound Cleanser Discharge Instruction: Cleanse the wound with wound cleanser prior to applying a clean dressing using gauze sponges, not tissue or cotton balls. Peri-Wound Care Skin Prep Discharge Instruction: cavelon to macerated wound margins Sween Lotion (Moisturizing lotion) DOHN, WEIDERT ([id number]) [id number] Page 11 of 13 Discharge Instruction: Apply moisturizing lotion as directed Topical Primary Dressing Maxorb Extra Ag+ Alginate Dressing, 4x4.75 (in/in) Discharge Instruction: Apply to excoriated  weeping areas Secondary Dressing ABD Pad, 8x10 Discharge Instruction: Apply over primary dressing as directed. Secured With Compression Wrap ThreePress (3 layer compression wrap) Discharge Instruction: Apply three layer compression as directed. Unnaboot w/Calamine, 4x10 (in/yd) Discharge Instruction: Apply Unnaboot on distal foot and  top of lower leg Compression Stockings Add-Ons Electronic Signature(s) Signed: 06/17/2023 12:12:43 PM By: [name] Entered By: [name] on 06/17/2023 11:28:38 -------------------------------------------------------------------------------- Wound Assessment Details Patient Name: Date of Service: [name], [name] 06/17/2023 11:00 A M Medical Record Number: [id number] Patient Account Number: [id number] Date of Birth/Sex: Treating RN: [birth year] (72 y.o. Marlan [name] Primary Care Syesha Thaw: [name] Other Clinician: Referring Cathie Bonnell: Treating Daviel Allegretto/Extender: [name] in Treatment: 70 Wound Status Wound Number: 6 Primary Calcinosis Etiology: Wound Location: Right, Anterior Lower Leg Wound Open Wounding Event: Gradually Appeared Status: Date Acquired: 05/12/2022 Comorbid Cataracts, Anemia, Lymphedema, Congestive Heart Failure, Deep Weeks Of Treatment: 57 History: Vein Thrombosis, Hypertension, Peripheral Venous Disease Clustered Wound: Yes Photos Wound Measurements Length: (cm) 0.2 Width: (cm) 0.6 Depth: (cm) 0.2 Clustered Quantity: 1 Area: (cm) 0.094 Volume: (cm) 0.019 Tauer, Daemion ([id number]) Wound Description Classification: Full Thickness Without Exposed Support Structures Wound Margin: Epibole Exudate Amount: Small Exudate Type: Serous Exudate Color: amber Foul Odor After Cleansing: No Slough/Fibrino Yes % Reduction in Area: 60.2% % Reduction in Volume: 20.8% Epithelialization: Large (67-100%) Tunneling: No Undermining: No [id number]  Page 12 of 13 Wound Bed Granulation Amount: Small (1-33%) Exposed Structure Granulation Quality: Pink Fascia Exposed: No Necrotic Amount: Large (67-100%) Fat Layer (Subcutaneous Tissue) Exposed: Yes Tendon Exposed: No Muscle Exposed: No Joint Exposed: No Bone Exposed: No Periwound Skin Texture Texture Color No Abnormalities Noted: Yes No Abnormalities Noted: No Atrophie Blanche: No Moisture Cyanosis: No No Abnormalities Noted: Yes Ecchymosis: No Erythema: No Hemosiderin Staining: Yes Mottled: No Pallor: No Rubor: No Temperature / Pain Temperature: No Abnormality Treatment Notes Wound #6 (Lower Leg) Wound Laterality: Right, Anterior Cleanser Soap and Water Discharge Instruction: May shower and wash wound with dial antibacterial soap and water prior to dressing change. Wound Cleanser Discharge Instruction: Cleanse the wound with wound cleanser prior to applying a clean dressing using gauze sponges, not tissue or cotton balls. Peri-Wound Care Sween Lotion (Moisturizing lotion) Discharge Instruction: Apply moisturizing lotion as directed Topical Primary Dressing Maxorb Extra Ag+ Alginate Dressing, 2x2 (in/in) Discharge Instruction: Apply to wound bed as instructed Secondary Dressing ABD Pad, 8x10 Discharge Instruction: Apply over primary dressing as directed. Secured With Compression Wrap ThreePress (3 layer compression wrap) Discharge Instruction: Apply three layer compression as directed. Compression Stockings Add-Ons Electronic Signature(s) Signed: 06/17/2023 12:12:43 PM By: [name] Entered By: [name] on 06/17/2023 11:29:01 [name] ([id number]) [id number] Page 13 of 13 -------------------------------------------------------------------------------- Vitals Details Patient Name: Date of Service: [name], [name] 06/17/2023 11:00 A M Medical Record Number: [id number] Patient Account Number: [id number] Date of  Birth/Sex: Treating RN: [birth year] (72 y.o. Marlan [name] Primary Care Lemarcus Baggerly: [name] Other Clinician: Referring Aldonia Keeven: Treating Celedonio Sortino/Extender: [name] in Treatment: 61 Vital Signs Time Taken: 11:15 Temperature (F): 97.5 Height (in): 71 Pulse (bpm): 66 Weight (lbs): 267 Respiratory Rate (breaths/min): 16 Body Mass Index (BMI): 37.2 Blood Pressure (mmHg): 131/77 Reference Range: 80 - 120 mg / dl Electronic Signature(s) Signed: 06/17/2023 12:12:43 PM By: [name] Entered By: [name] on 06/17/2023 11:15:48

## 2023-06-17 NOTE — Progress Notes (Signed)
HARSHAL, BURR ([id number]) [id number] Page 1 of 18 Visit Report for 06/17/2023 Chief Complaint Document Details Patient Name: Date of Service: [name], [name] 06/17/2023 11:00 A M Medical Record Number: [id number] Patient Account Number: [id number] Date of Birth/Sex: Treating RN: 1951-04-28 (72 y.o. M) Primary Care Provider: [name] Other Clinician: Referring Provider: Treating Provider/Extender: [name] in Treatment: 42 Information Obtained from: Patient Chief Complaint Patient seen for complaints of Non-Healing Wounds. Electronic Signature(s) Signed: 06/17/2023 12:23:18 PM By: Duanne Guess MD FACS Entered By: Duanne Guess on 06/17/2023 12:23:18 -------------------------------------------------------------------------------- Debridement Details Patient Name: Date of Service: [name] 06/17/2023 11:00 A M Medical Record Number: [id number] Patient Account Number: [id number] Date of Birth/Sex: Treating RN: 1951/06/02 (72 y.o. [name] Primary Care Provider: [name] Other Clinician: Referring Provider: Treating Provider/Extender: [name] in Treatment: 70 Debridement Performed for Assessment: Wound #12 Left,Anterior Lower Leg Performed By: Physician Duanne Guess, MD The following information was scribed by: Zenaida Deed The information was scribed for: Duanne Guess Debridement Type: Debridement Severity of Tissue Pre Debridement: Fat layer exposed Level of Consciousness (Pre-procedure): Awake and Alert Pre-procedure Verification/Time Out Yes - 11:40 Taken: Start Time: 11:40 Pain Control: Lidocaine 4% Topical Solution Percent of Wound Bed Debrided: 100% T Area Debrided (cm): otal 0.03 Tissue and other material debrided: Non-Viable, Eschar, Fibrin/Exudate Level: Non-Viable Tissue Debridement Description: Selective/Open Wound Instrument:  Curette Bleeding: Minimum Hemostasis Achieved: Pressure Procedural Pain: 0 Post Procedural Pain: 0 Response to Treatment: Procedure was tolerated well Level of Consciousness (Post- Awake and Alert procedure): Post Debridement Measurements of Total Wound Length: (cm) 0.2 Width: (cm) 0.2 Depth: (cm) 0.1 Volume: (cm) 0.003 [name], [name] ([id number]) [id number] Page 2 of 18 Character of Wound/Ulcer Post Debridement: Improved Severity of Tissue Post Debridement: Fat layer exposed Post Procedure Diagnosis Same as Pre-procedure Electronic Signature(s) Signed: 06/17/2023 12:30:01 PM By: Duanne Guess MD FACS Signed: 06/17/2023 1:02:55 PM By: Zenaida Deed RN, BSN Entered By: Zenaida Deed on 06/17/2023 11:41:50 -------------------------------------------------------------------------------- Debridement Details Patient Name: Date of Service: [name], [name] 06/17/2023 11:00 A M Medical Record Number: [id number] Patient Account Number: [id number] Date of Birth/Sex: Treating RN: Sep 13, 1950 (72 y.o. [name] Primary Care Provider: [name] Other Clinician: Referring Provider: Treating Provider/Extender: [name] in Treatment: 70 Debridement Performed for Assessment: Wound #3 Right,Medial Lower Leg Performed By: Physician Duanne Guess, MD The following information was scribed by: Zenaida Deed The information was scribed for: Duanne Guess Debridement Type: Debridement Level of Consciousness (Pre-procedure): Awake and Alert Pre-procedure Verification/Time Out Yes - 11:40 Taken: Start Time: 11:40 Pain Control: Lidocaine 4% T opical Solution Percent of Wound Bed Debrided: 75% T Area Debrided (cm): otal 7.06 Tissue and other material debrided: Viable, Non-Viable, Eschar, Slough, Subcutaneous, Slough Level: Skin/Subcutaneous Tissue Debridement Description: Excisional Instrument: Curette Bleeding:  Minimum Hemostasis Achieved: Pressure Procedural Pain: 0 Post Procedural Pain: 0 Response to Treatment: Procedure was tolerated well Level of Consciousness (Post- Awake and Alert procedure): Post Debridement Measurements of Total Wound Length: (cm) 3 Width: (cm) 4 Depth: (cm) 0.2 Volume: (cm) 1.885 Character of Wound/Ulcer Post Debridement: Improved Post Procedure Diagnosis Same as Pre-procedure Electronic Signature(s) Signed: 06/17/2023 12:30:01 PM By: Duanne Guess MD FACS Signed: 06/17/2023 1:02:55 PM By: Zenaida Deed RN, BSN Entered By: Zenaida Deed on 06/17/2023 11:43:54 [name] ([id number]) [id number] Page 3 of 18 -------------------------------------------------------------------------------- HPI Details Patient Name: Date of Service: [name], [name] 06/17/2023 11:00 A M Medical Record Number: [id number] Patient Account Number: [id number] Date  of Birth/Sex: Treating RN: Aug 07, 1950 (72 y.o. M) Primary Care Provider: [name] Other Clinician: Referring Provider: Treating Provider/Extender: [name] in Treatment: 69 History of Present Illness HPI Description: ADMISSION 02/09/2022 This is a 72 year old man who recently moved to [place] from [place]. He has a history of of coronary artery disease and prior left popliteal vein DVT . He is not diabetic and does not smoke. 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 He has had multiple spinal surgeries and has limited mobility. He primarily uses a motorized wheelchair. He has had lymphedema for many years and does have lymphedema pumps although he says he does not use them frequently. He has wounds on his bilateral lower extremities. He was receiving care in [place] for these prior to his move. They were apparently packing his wounds with Betadine soaked gauze. He has worn compression wraps in the past but not recently. He did say that  they helped tremendously. In clinic today, his right ABI is noncompressible but has good signals; the left ABI was 1.17. No formal vascular studies have been performed. On his left dorsal foot, there is a circular lesion that exposes the fat layer. There is fairly heavy slough accumulation within the wound, but no significant odor. On his right medial calf, he has multiple pinholes that have slough buildup in them and probe for about 2 to 4 mm in depth. They are draining clear fluid. On his right lateral midfoot, he has ulceration consistent with venous stasis. It is geographic and has slough accumulation. 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 02/17/2022: All of the wounds are little bit larger today and have more slough accumulation. Today, the medial calf openings are larger and probe to something that feels calcified. There is odor coming from his wounds. His vascular studies are scheduled for August 9 and 10. 02-24-2022 upon evaluation today patient presents for follow-up concerning his ongoing issues here with his lower extremities. With that being said he does have significant problems with what appears to be cellulitis unfortunately the PCR culture that was sent last week got crushed by [employer] in transit. With that being said we are going to reobtain a culture today although I am actually to do it on the right medial leg as this seems to be the most inflamed area today where there is some questionable drainage as well. I am going to remove some of the calcium deposits which are loosening obtain a deeper culture from this area. Fortunately I do not see any evidence of active infection systemically which is good news but locally I think we are having a bigger issue here. He does have his appointment next Thursday with vascular. Patient has also been referred to [facility] for further evaluation and treatment  of the calcinosis for possible sulfate injections. 03/04/2022: The culture that was performed last week grew out multiple species including Klebsiella, Morganella, and Pseudomonas aeruginosa. He had been prescribed Bactrim but this was not adequate to cover all of the species and levofloxacin was recommended. He completed the Bactrim but did not pick up the levofloxacin and begin taking it until yesterday. 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 He had arterial studies performed today. He does have evidence of peripheral arterial  disease. Results copied here: +-------+-----------+-----------+------------+------------+ ABI/TBIT oday's ABIT oday's TBIPrevious ABIPrevious TBI +-------+-----------+-----------+------------+------------+ Right [place] 0.75    +-------+-----------+-----------+------------+------------+ Left [place] 0.57    +-------+-----------+-----------+------------+------------+ Arterial wall calcification precludes accurate ankle pressures and ABIs. Summary: Right: Resting right ankle-brachial index indicates noncompressible right lower extremity arteries. The right toe-brachial index is normal. Left: Resting left ankle-brachial index indicates noncompressible left lower extremity arteries. The left toe-brachial index is abnormal. He continues to have further tissue breakdown at the right medial calf and there is ample slough accumulation along with necrotic subcutaneous tissue. The left dorsal foot wound has built up slough, as well. The right medial foot wound is nearly closed with just a light layer of eschar over the surface. The right dorsal lateral foot wound has also accumulated a fair amount of slough and remains quite tender. 03/10/2022: He has been taking the prescribed levofloxacin and has about  3 days left of this. The erythema and induration on his right leg has improved. He continues to experience further breakdown of the right medial calf wound. There is extensive slough and debris accumulation there. There is heavy slough on his left dorsal foot wound, but no odor or purulent drainage. The right medial foot wound appears to be closed. The right dorsal lateral foot wound also has a fair amount of slough but it is less tender than at our last visit. He still does not have an appointment with dermatology. 03/22/2022: The right anterior tibial wound has closed. The right lateral foot wound is nearly closed with just a little bit of slough. The left dorsal foot wound is smaller and continues to have some slough buildup but less so than at prior visits. There is good granulation tissue underlying the slough. Unfortunately the right medial calf wound continues to deteriorate. It has a foul odor today and a lot of necrotic tissue, the surrounding skin is also erythematous and moist. 03/30/2022: The right lateral foot wound continues to contract and just has a little bit of slough and eschar present. The left dorsal foot wound is also smaller with slough overlying good granulation tissue. The right medial calf wound actually looks quite a bit better today; there is no odor and there is much less necrotic tissue although some remains present. We have been using topical gentamicin and he has been taking oral Augmentin. 04/07/2022: The patient saw dermatology at [facility] last week. Unfortunately, it appears that they did not read any of the documentation that was provided. 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 Overall, it was a  highly unsatisfactory consultation. In the meantime, however, we were able to identify compounding pharmacy who could create a topical sodium thiosulfate ointment. The patient has that with him today. Both of the right foot wounds are now closed. The left dorsal foot wound has contracted but still has a layer of slough on the surface. The medial calf wounds on the right all look a little bit better. They still have heavy slough along with the chunks of calcium. Everything is stained purple. [name], [name] ([id number]) [id number] Page 4 of 18 04/14/2022: The wound on his dorsal left foot is a little bit smaller again today. There is still slough accumulation on the surface. The medial calf wounds have quite a  bit less slough accumulation today. His right leg, however, is warm and erythematous, concerning for cellulitis. 04/14/2022: He completed his course of Augmentin yesterday. The medial calf wound sites are much cleaner today and shallower. There is less fragmented calcium in the wounds. He has a lot of lymphedema fluid-like drainage from these wounds, but no purulent discharge or malodor. The wound on his dorsal left foot has also contracted and is shallower. There is a layer of slough over healthy granulation tissue. 05/05/2022: The left dorsal foot wound is smaller today with less slough and more granulation tissue. The right medial calf wounds are shallower, but have extensive slough and some fat necrosis. The drainage has diminished considerably, however. 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 05/12/2022: The left dorsal foot wound continues to contract and fill with granulation tissue. There is slough on the wound surface. The right medial calf wounds also continue to fill with healthy tissue, but there is remaining slough and fat necrosis present. He  had a significant increase in his drainage this week and it was a bright yellow-green color. He also had a new wound open over his right anterior tibial surface associated with the spike of cutaneous calcium. The leg is more red, as well, concerning for infection. 05/19/2022: His culture returned positive for Pseudomonas. He is currently taking a course of oral levofloxacin. We have also been using topical gentamicin mixed in with his sodium thiosulfate. All of the wounds look better this week. 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 There is slough and nonviable subcutaneous tissue still present. The new anterior tibial wound just has a light layer of slough. The dorsal foot wound also has some slough present and appears a little dry today. 05/26/2022: The right medial leg wound continues to improve. It is feeling with granulation tissue, but still accumulates a fairly thick layer of slough on the surface. The more recent anterior tibial wound has remained quite small, but also has some slough on the surface. The left dorsal foot wound has contracted somewhat and has perimeter epithelialization. He completed his oral levofloxacin. 06/02/2022: The left dorsal foot wound continues to improve significantly. There is just a little slough on the wound surface. The right medial leg wound had black drainage, but it looks like silver alginate was applied last week and I theorized that silver sulfide was created with a combination of the silver alginate and the sodium thiosulfate. It did, however, have a bit of an odor to it and extensive slough accumulation. The small anterior tibial wound also had a bit of slough and nonviable subcutaneous tissue present. The skin of his leg was rather macerated, as well. 06/09/2022: The left dorsal foot wound is smaller again this week with just a light layer of slough on the surface. The right medial leg wound looks substantially better.  The leg and periwound skin are no longer red and macerated. There is good granulation tissue forming with less slough and nonviable tissue. The anterior tibial wound just has a small amount of slough accumulation. 06/16/2022: The left dorsal foot wound continues to contract. His wrap slipped a little bit, however, and his foot is more swollen. The right medial leg wound continues to improve. The underlying tissue is more vital-appearing and there is less slough. He does have substantial drainage. The small anterior tibial wound has a bit  of slough accumulation. 06/23/2022: The left dorsal foot wound is about half the size as it was last week. There is just some light slough on the surface and some eschar buildup around the edges. The right anterior leg wound is small with a little slough on the surface. Unfortunately, the right medial leg wound deteriorated fairly substantially. It is malodorous with gray/green/black discoloration. 06/30/2022: The left dorsal foot wound continues to contract. It is nearly closed with just a light layer of slough present. The right anterior leg wound is about the same size and has a small amount of slough on the surface. The right medial leg wound looks a lot better than it did last week. The odor has abated and the tissue is less pulpy and necrotic-looking. There is still a fairly thick layer of slough present. 12/13; the left dorsal foot wound looks very healthy. There was no need to change the dressing here and no debridement. He has a small area on the right anterior lower leg apparently had not changed much in size. The area on the medial leg is the most problematic area this is large with some depth and a completely nonviable surface today. 07/14/2022: The left dorsal foot wound is smaller today with just a little slough and eschar present. The right anterior lower leg wound is about the same without any significant change. The medial leg wound has a black and  green surface and is malodorous. No purulent drainage and the periwound skin is intact. Edema control is suboptimal. 07/21/2022: The dorsal foot wound continues to contract and is nearly closed with just a little bit of slough and eschar on the surface. The right anterior lower leg wound is shallower today but otherwise the dimensions are unchanged. The medial leg wound looks significantly better although it still has a nonviable surface; the odor and black/green surface has resolved. His culture came back with multiple species sensitive to Augmentin, which was prescribed and a very low level of Pseudomonas, which should be handled by the topical gentamicin we have been using. 07/28/2022: The dorsal foot wound is down to just a very tiny opening with a little eschar on the surface. The right anterior tibial wound is about the same with some slough buildup. The right medial leg wound looks quite a bit better this week. There is thick rubbery slough on the surface, but the odor and drainage has improved significantly. 08/04/2022: The dorsal foot wound is almost healed, but the periwound skin has broken down. This appears to be secondary to moisture and adhesive from the absorbent pad. The right anterior tibial wound is shallower. The right medial leg wound continues to improve. It is smaller, still with rubbery slough on the surface. No malodor or purulent drainage. 08/11/2022: The dorsal foot wound is about the same size. It is a little bit dry with some slough accumulation. The right anterior tibial wound is also unchanged. The right medial leg wound is filling in further with good granulation tissue. Still with some slough buildup on the surface. 08/18/2022: His left leg is bright red, hot, and swollen with cellulitis. The dorsal foot wound actually looks quite good and is superficial with just a little bit of slough accumulation. The right anterior tibial wound is unchanged. The right medial leg wound  has filled in with good granulation tissue but has copious amounts of drainage. 08/25/2022: His cellulitis has responded nicely to the oral antibiotics. His dorsal foot wound is smaller but has a fair amount of  slough buildup. The right anterior tibial wound remains stable and unchanged. The right medial leg wound has more granulation tissue under a layer of slough, but continues to have copious drainage. The drainage is actually causing skin irritation and threatens to result in breakdown. 09/01/2022: He continues to have profuse drainage from his wounds which is resulting in tissue maceration on both the dorsum of his left foot and the periwound distal to his medial leg wound. The dorsal foot wound is nearly healed, despite this. The medial right leg wound is filling in with good granulation tissue. We are working on getting home health assistance to change his dressings more frequently to try and control the drainage better. 09/08/2022: We have had success in controlling his drainage. The tissue maceration has improved significantly and his swelling has come down quite markedly. The medial right leg wound is much more superficial and has substantially less nonviable tissue present. The anterior tibial wound is a little bit larger, however with some nonviable tissue present. 09/15/2022: The dorsal foot wound is nearly closed. The anterior tibial wound measures slightly larger today. The medial right leg wound continues to improve quite dramatically with less nonviable tissue and more robust-looking granulation. Edema control is significantly better. 09/22/2022: The left dorsal foot wound remains open, albeit quite tiny. The anterior tibial wound is a little bit larger again today. The medial right leg wound continues to improve with an increase in robust and healthy granulation tissue and less accumulation of nonviable tissue. [name], [name] ([id number]) [id number] Page 5 of  18 09/29/2022: His home health providers wrapped his left leg extremely tightly and he ended up having to cut the wrap off of his foot. This resulted in his foot being very swollen and that the dorsal foot wound is a little bit larger today. The anterior tibial wound is about the same size with a little slough on the surface. The medial right leg wound continues to fill in. There are very few areas with any significant depth. There is still slough accumulation. 3/13; Patient has a left dorsal foot wound along with a right anterior tibial wound and right medial wound. We are using silver alginate to the left dorsal foot wound, Santyl and sodium thiosulfate compound ointment to the right lower extremity wounds under 4 layer compression. Patient has no issues or complaints today. 10/13/2022: The left dorsal foot wound is a little bit bigger today, but his foot is also a little bit more swollen. The right medial leg wound has filled in more and has substantially less depth. There is still some nonviable tissue present. The small anterior lower leg wound looks about the same. 10/20/2022: The left dorsal foot wound is epithelializing. There does not appear to be much open at this site. There is some eschar around the edges. The right medial leg wound continues to fill in with granulation tissue. The small anterior lower leg wound is a little bit bigger and there is a chunk of calcium protruding. He continues to have fairly copious drainage from the right leg. 10/27/2022: His home health agency failed to show up at all this past week and as a result, the excessive drainage from his right leg wound has resulted in tissue breakdown. His leg is red and irritated. The wounds themselves look about the same. The wound on his left dorsal foot is nearly closed underneath some eschar. 11/03/2022: We continue to have difficulty with his home health agency. He continues to have significant drainage from his right  leg wound  which has resulted in even further tissue breakdown. He also was not taking his Lasix as prescribed and I think much of the drainage has been his lymphedema fluid choosing the path of least resistance. The wound on his left dorsal foot is closed, but in the process of cleaning the site, dry skin was pulled off and he has a new small superficial wound just adjacent to where the dorsal foot wound was. 11/12/2022: His left dorsal foot is completely healed. The right medial leg continues to deteriorate. He is not taking his Lasix as prescribed, nor is he using his lymphedema pumps at all. As a result, he has massive amounts of drainage saturating his dressings and causing further tissue breakdown on his medial leg. Today, his dressings had a foul odor, as well. 11/19/2022: His right medial leg looks even worse today. There is more erythema and moisture related tissue breakdown. The 2 anterior wounds look a little bit better. The culture that I took last week did produce Pseudomonas and he is currently taking levofloxacin for this. 11/26/2022: There has been significant improvement to his right medial leg. It is far less raw and many of the superficial open areas have epithelialized. He continues to cut the foot portion of his wrap and as result his foot is bulbous and swollen compared to the rest of his leg, where edema control is good. The smaller of the 2 anterior tibial wound is closed, while the larger is contracting somewhat with a bit of slough at the base. 12/03/2022: His leg continues to demonstrate improvement. There has been epithelialization of much of the denuded area. He has a new small anterior tibial wound that has opened around a nidus of calcium. Edema control is improved. 12/10/2022: Continued improvement in the periwound skin. The anterior tibial wounds are about the same, but the medial leg wound has improved further. There is less nonviable tissue present. 12/17/2022: Between his visit on  Tuesday with the nurse and today, he has had significantly more drainage which has resulted in some periwound tissue maceration. I suspect this correlates somewhat with his diuretic use, as he says he takes his diuretic religiously on Saturday, Sunday, and Monday and then does not take it after Tuesday due to being out and about and not being close to easy restroom access. I suspect the excess fluid is just simply draining out of his leg. 12/21/2022: This was supposed to be a nurse visit, but his leg has broken down so significantly, that we have converted to a wound care visit. He did take his diuretics over the weekend, but despite this, he has had copious drainage and all of his tissues have broken down substantially from the right medial leg ulcer all the way down to his ankle and even wrapping posterior to this. There is blue-green staining on his dressing, suggesting ongoing presence of Pseudomonas aeruginosa. 12/24/2022: The wound already looks better than it did 2 days ago. He has been taking ciprofloxacin and the Urgo clean dressing we applied did a much better job of wicking the drainage away from his leg. There is slough on the entire surface of the medial leg wound, but the erythema and induration has improved in the past couple of days. 12/31/2022: Continued improvement of the wound. He says that the pain has basically abated since being on ciprofloxacin. Still with fairly heavy drainage and slough accumulation. 01/07/2023: Overall continued improvement. Still with heavy slough buildup, but less drainage. 01/14/2023: There is a  little bit of improvement. The skin is still looking a bit excoriated. He has a few more days left of ciprofloxacin. 01/21/2023: There has been significant improvement. The periwound skin is in much better condition. The ulcers are shallower and actually have some good granulation tissue present under some slough. 01/28/2023: The wound continues to improve. All of the  open areas measured smaller today. There is slough accumulation on the surfaces. His drainage has decreased markedly. 02/04/2023: Continued improvement in the wound. Drainage remains significantly diminished. Slough accumulation is present but to a lesser extent. Periwound skin continues to improve. Edema control is good. 02/18/2023: The heavy drainage persists, although it is was not as significant today as it was at his nurse visit on Tuesday. The drainage is yellow and does not have the blue-green discoloration suggestive of Pseudomonas. The actual wounds on his leg are getting smaller, but the periwound skin is quite excoriated. 02/25/2023: His periwound skin is quite excoriated and he has a lot of clear yellow drainage. The main wounds are filling in further and epithelializing. 03/04/2023: The excoriation of the periwound skin has improved. Most of that area has dried up. The main wounds have less slough in them today. 03/11/2023: The main wounds have accumulated a little bit of slough. The periwound excoriation continues to improve. Unfortunately, he suffered a fall over the previous weekend and has abrasions on his knees bilaterally and on his right wrist and upper arm. In addition, the skin on his left lower leg apparently blistered and opened, leaving several superficial wounds on this extremity. He has been approved for the [facility] Press wound VAC and has the equipment with him today. 03/18/2023: He has extensive excoriation to the periwound skin on the right medial lower leg. The actual wounds themselves are smaller with slough accumulation. The top of his left foot has begun weeping again. Using the [facility] and Press wound VAC, he says that he through 4 canisters. He seems to be very fluid overloaded, without any significant dyspnea. 03/25/2023: The wound to his left anterior tibial surface has healed. The left dorsal foot wound has gotten smaller and has a small area of hypertrophic granulation  tissue present. The periwound skin on the right medial lower leg looks slightly better, but still appears excoriated. The actual wounds continue to contract but are still accumulating quite a bit of slough. The tiny right anterior leg wounds are contracting. 04/01/2023: The left dorsal foot wound got a little bit bigger this week. There is slough accumulation. The periwound skin on the right medial lower leg continues to [name], [name] ([id number]) [id number] Page 6 of 18 improve. The main medial right lower leg wounds have a dark gray discoloration and there is a strong odor coming from them. The back of his right lower leg just above the ankle also has a little bit of new breakdown where he is draining edema fluid. The small anterior leg wounds are stable. 04/08/2023: Since starting antibiotics last week, there has been tremendous improvement. The left dorsal foot wound is nearly closed with just a little bit of eschar on the surface. The right medial lower leg is less excoriated and red and he has had substantially less drainage. The amount of slough on the open areas of his wounds has decreased markedly and there is no longer any odor. The back of his right lower leg that was newly open last week has dried up, but is not completely closed yet. 04/15/2023: The improvement continues with significantly less  drainage, resulting in less excoriation and irritation. The back of his right lower leg is closed, as is the left dorsal foot wound. The open areas on the right medial leg are smaller. 04/22/2023: His drainage has diminished significantly. The tissue maceration and excoriation surrounding the right medial leg wound has also improved substantially. There is slough on all of the open wound surfaces. He does have a bit of an odor coming from his wound today, but there is no purulent drainage nor any significant discoloration of the drainage in his VAC. 04/29/2023: He has a new  wound on his left lower anterior tibial surface. This was a blister that came up and then ruptured. There is thin slough on the wound surface but no concern for infection. He has had significantly diminished drainage from his right medial leg ulcers. He was previously going through 4 canisters a day secondary to drainage and this week, he has used only 1 for the entire week. The open areas on his right medial leg are smaller and fewer in number. There is slough on all of the surfaces. No malodor appreciated. 05/06/2023: The wound on his left anterior tibial surface has epithelialized significantly. There is just a small area open in the center. The wound is clean without any slough or other debris accumulation. The drainage on the right leg has almost completely dry. There is slough on the open wound surfaces with significantly more epithelialized between the open sites. He continues on levofloxacin. 05/13/2023: His left lower leg wound is healed. The right lower leg wound continues to improve. The drainage has been minimal and he has not been in the wound VAC for over a week now. There is better skin filling between the open areas and less slough accumulation on the open surfaces. 05/20/2023: The anterior leg wounds are closing up and there is no slough on the open areas that remain. His medial leg wound was measured longer today, but this appears to include some stuck on silver alginate and dry eschar, rather than the actual open wound areas. There is slough accumulation on the open surfaces. Edema control is good and his drainage has not been excessive. 05/27/2023: His wrap slipped up on his foot and he has a new small ulcer on the dorsal aspect of his right foot. The other wounds are basically stable with the usual thick slough accumulation. Drainage remains well-controlled. Edema control is good. 06/03/2023: The dorsal foot ulcer is healed. The other wounds actually are quite a bit smaller this  week. There is less depth to the large medial leg ulcers. Edema control is excellent. 06/10/2023: He has had a new wound open on his left lower leg, secondary to friction from his compression wrap. The medial leg ulcers are all smaller; the anterior wounds were measured larger, but this appears to be due to some minor moisture-related breakdown. He continues on levofloxacin. 06/17/2023: The wound on his left lower leg is nearly closed. There is just a small open area that remains underneath some eschar and dried silver alginate. The medial leg ulcers continue to improve with greater areas of epithelialization between each of the open sites. There is some slough accumulation here. No real change to the anterior leg wounds. Unfortunately, when his wrap was being removed, he was cut with the nurse's scissors so he has a new small wound on his upper right lower leg just below the knee. Electronic Signature(s) Signed: 06/17/2023 12:24:59 PM By: Duanne Guess MD FACS Entered By: Duanne Guess  on 06/17/2023 12:24:59 -------------------------------------------------------------------------------- Physical Exam Details Patient Name: Date of Service: [name], [name] 06/17/2023 11:00 A M Medical Record Number: [id number] Patient Account Number: [id number] Date of Birth/Sex: Treating RN: 29-Sep-1950 (72 y.o. M) Primary Care Provider: [name] Other Clinician: Referring Provider: Treating Provider/Extender: [name] in Treatment: 70 Constitutional . . . . no acute distress. Respiratory Normal work of breathing on room air.. Notes 06/17/2023: The wound on his left lower leg is nearly closed. There is just a small open area that remains underneath some eschar and dried silver alginate. The medial leg ulcers continue to improve with greater areas of epithelialization between each of the open sites. There is some slough accumulation here. No real change to the  anterior leg wounds. Unfortunately, when his wrap was being removed, he was cut with the nurse's scissors so he has a new small wound on his upper right lower leg just below the knee. Electronic Signature(s) Signed: 06/17/2023 12:25:38 PM By: Duanne Guess MD FACS Entered By: Duanne Guess on 06/17/2023 12:25:37 [name] ([id number]) [id number] Page 7 of 18 -------------------------------------------------------------------------------- Physician Orders Details Patient Name: Date of Service: [name], HOWENSTINE 06/17/2023 11:00 A M Medical Record Number: [id number] Patient Account Number: [id number] Date of Birth/Sex: Treating RN: 29-Jan-1951 (72 y.o. 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 - RN visit 11/27 Return A ppointment in 2 weeks. - RN visit Return A Return appointment in 3 weeks. - RN visit Return appointment in 1 month. - Dr. [name] RM 3 Friday 12/20 @ 0830 am Anesthetic (In clinic) Topical Lidocaine 4% applied to wound bed Bathing/ Shower/ Hygiene May shower with protection but do not get wound  dressing(s) wet. Protect dressing(s) with water repellant cover (for example, large plastic bag) or a cast cover and may then take shower. Lymphedema Treatment Plan - Exercise, Compression and Elevation Bilateral Lower Extremities Exercise daily as tolerated. (Walking, ROM, Calf Pumps and Toe Taps) Compression Garments, Class II. Wear daily when out of bed. May remove at bedtime Compression Wraps as ordered Elevate legs 30 - 60 minutes at or above heart level at least 3 - 4 times daily as able/tolerated Avoid standing for long periods and elevate leg(s) parallel to the floor when sitting Use Pneumatic Compression Device on leg(s) 2-3 times a day for 45-60 minutes Wound Treatment Wound #12 - Lower Leg Wound Laterality: Left, Anterior Cleanser: Soap and Water 1 x Per Week/30 Days Discharge Instructions: May shower and wash wound with dial antibacterial soap and water prior to dressing change. Cleanser: Wound Cleanser 1 x Per Week/30 Days Discharge Instructions: Cleanse the wound with wound cleanser prior to applying a clean dressing using gauze sponges, not tissue or cotton balls. Peri-Wound Care: Sween Lotion (Moisturizing lotion) 1 x Per Week/30 Days Discharge Instructions: Apply moisturizing lotion as directed Prim Dressing: Maxorb  Extra Ag+ Alginate Dressing, 2x2 (in/in) 1 x Per Week/30 Days ary Discharge Instructions: Apply to wound bed as instructed Secondary Dressing: ABD Pad, 8x10 1 x Per Week/30 Days Discharge Instructions: Apply over primary dressing as directed. Compression Wrap: ThreePress (3 layer compression wrap) 1 x Per Week/30 Days Discharge Instructions: Apply three layer compression as directed. [name], [name] ([id number]) [id number] Page 8 of 18 Wound #3 - Lower Leg Wound Laterality: Right, Medial Cleanser: Soap and Water 1 x Per Week/30 Days Discharge Instructions: May shower and wash wound with dial antibacterial soap and water prior to  dressing change. Cleanser: Wound Cleanser 1 x Per Week/30 Days Discharge Instructions: Cleanse the wound with wound cleanser prior to applying a clean dressing using gauze sponges, not tissue or cotton balls. Peri-Wound Care: Skin Prep 1 x Per Week/30 Days Discharge Instructions: cavelon to macerated wound margins Peri-Wound Care: Sween Lotion (Moisturizing lotion) 1 x Per Week/30 Days Discharge Instructions: Apply moisturizing lotion as directed Prim Dressing: Maxorb Extra Ag+ Alginate Dressing, 4x4.75 (in/in) 1 x Per Week/30 Days ary Discharge Instructions: Apply to excoriated weeping areas Secondary Dressing: ABD Pad, 8x10 1 x Per Week/30 Days Discharge Instructions: Apply over primary dressing as directed. Compression Wrap: ThreePress (3 layer compression wrap) 1 x Per Week/30 Days Discharge Instructions: Apply three layer compression as directed. Compression Wrap: Unnaboot w/Calamine, 4x10 (in/yd) 1 x Per Week/30 Days Discharge Instructions: Apply Unnaboot on distal foot and top of lower leg Wound #6 - Lower Leg Wound Laterality: Right, Anterior Cleanser: Soap and Water 1 x Per Week/30 Days Discharge Instructions: May shower and wash wound with dial antibacterial soap and water prior to dressing change. Cleanser: Wound Cleanser 1 x Per Week/30 Days Discharge Instructions: Cleanse the wound with wound cleanser prior to applying a clean dressing using gauze sponges, not tissue or cotton balls. Peri-Wound Care: Sween Lotion (Moisturizing lotion) 1 x Per Week/30 Days Discharge Instructions: Apply moisturizing lotion as directed Prim Dressing: Maxorb Extra Ag+ Alginate Dressing, 2x2 (in/in) 1 x Per Week/30 Days ary Discharge Instructions: Apply to wound bed as instructed Secondary Dressing: ABD Pad, 8x10 1 x Per Week/30 Days Discharge Instructions: Apply over primary dressing as directed. Compression Wrap: ThreePress (3 layer compression wrap) 1 x Per Week/30 Days Discharge Instructions:  Apply three layer compression as directed. Electronic Signature(s) Signed: 06/17/2023 12:30:01 PM By: Duanne Guess MD FACS Entered By: Duanne Guess on 06/17/2023 12:27:47 -------------------------------------------------------------------------------- Problem List Details Patient Name: Date of Service: [name], [name] 06/17/2023 11:00 A M Medical Record Number: [id number] Patient Account Number: [id number] Date of Birth/Sex: Treating RN: [birth year] (72 y.o. 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492 Non-pressure chronic ulcer of skin of other sites with fat layer exposed 03/11/2023 03/11/2023 L97.521 Non-pressure chronic ulcer of other part of left foot limited to breakdown of skin 03/18/2023 02/09/2022 L97.512 Non-pressure chronic ulcer of other part of right foot with fat  layer exposed 05/27/2023 02/17/2022 Electronic Signature(s) Signed: 06/17/2023 12:21:52 PM By: Duanne Guess MD FACS Entered By: Duanne Guess on 06/17/2023 12:21:52 -------------------------------------------------------------------------------- Progress Note Details Patient Name: Date of Service: [name] 06/17/2023 11:00 A M Medical Record Number: [id number] Patient Account Number: [id number] Date of Birth/Sex: Treating RN: 11-27-1950 (72 y.o. M) Primary Care Provider: [name] Other Clinician: Referring Provider: Treating Provider/Extender: [name] in Treatment: [address] Subjective Chief Complaint [name] ([id number]) [id number] Page 10 of 18 Information obtained from Patient Patient seen for complaints of Non-Healing Wounds. History of Present Illness (HPI) ADMISSION 02/09/2022 This is a 72 year old man who recently moved to [place] from [place]. He has a history of of coronary artery disease and prior left popliteal vein DVT . He is not diabetic and does not smoke. 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 He has had multiple spinal surgeries and has limited mobility. He primarily uses a motorized wheelchair. He has had lymphedema for many years and does have lymphedema pumps although he says he does not use them frequently. He has wounds on his bilateral lower extremities. He was receiving care in [place] for these prior to his move. They were apparently packing his wounds with Betadine soaked gauze. He has worn compression wraps in the past but not recently. He did say that they helped tremendously. In clinic today, his right ABI is noncompressible but has good signals; the left ABI was 1.17. No formal vascular studies have been performed. On his left dorsal foot, there is a circular lesion that exposes the fat layer. There is fairly heavy slough accumulation within the wound, but no  significant odor. On his right medial calf, he has multiple pinholes that have slough buildup in them and probe for about 2 to 4 mm in depth. They are draining clear fluid. On his right lateral midfoot, he has ulceration consistent with venous stasis. It is geographic and has slough accumulation. 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 02/17/2022: All of the wounds are little bit larger today and have more slough accumulation. Today, the medial calf openings are larger and probe to something that feels calcified. There is odor coming from his wounds. His vascular studies are scheduled for August 9 and 10. 02-24-2022 upon evaluation today patient presents for follow-up concerning his ongoing issues here with his lower extremities. With that being said he does have significant problems with what appears to be cellulitis unfortunately the PCR culture that was sent last week got crushed by UPS in transit. With that being said we are going to reobtain a culture today although I am actually to do it on the right medial leg as this seems to be the most inflamed area today where there is some questionable drainage as well. I am going to remove some of the calcium deposits which are loosening obtain a deeper culture from this area. Fortunately I do not see any evidence of active infection systemically which is good news but locally I think we are having a bigger issue here. He does have his appointment next Thursday with vascular. Patient has also been  referred to [facility] for further evaluation and treatment of the calcinosis for possible sulfate injections. 03/04/2022: The culture that was performed last week grew out multiple species including Klebsiella, Morganella, and Pseudomonas aeruginosa. He had been prescribed Bactrim but this was not adequate to cover all of the species and levofloxacin was recommended. He  completed the Bactrim but did not pick up the levofloxacin and begin taking it until yesterday. 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 He had arterial studies performed today. He does have evidence of peripheral arterial disease. Results copied here: +-------+-----------+-----------+------------+------------+ ABI/TBIT oday's ABIT oday's TBIPrevious ABIPrevious TBI +-------+-----------+-----------+------------+------------+ Right [place] 0.75    +-------+-----------+-----------+------------+------------+ Left [place] 0.57    +-------+-----------+-----------+------------+------------+ Arterial wall calcification precludes accurate ankle pressures and ABIs. Summary: Right: Resting right ankle-brachial index indicates noncompressible right lower extremity arteries. The right toe-brachial index is normal. Left: Resting left ankle-brachial index indicates noncompressible left lower extremity arteries. The left toe-brachial index is abnormal. He continues to have further tissue breakdown at the right medial calf and there is ample slough accumulation along with necrotic subcutaneous tissue. The left dorsal foot wound has built up slough, as well. The right medial foot wound is nearly closed with just a light layer of eschar over the surface. The right dorsal lateral foot wound has also accumulated a fair amount of slough and remains quite tender. 03/10/2022: He has been taking the prescribed levofloxacin and has about 3 days left of this. The erythema and induration on his right leg has improved. He continues to experience further breakdown of the right medial calf wound. There is extensive slough and debris accumulation there. There is heavy slough on his left dorsal foot wound, but no odor or purulent drainage. The right  medial foot wound appears to be closed. The right dorsal lateral foot wound also has a fair amount of slough but it is less tender than at our last visit. He still does not have an appointment with dermatology. 03/22/2022: The right anterior tibial wound has closed. The right lateral foot wound is nearly closed with just a little bit of slough. The left dorsal foot wound is smaller and continues to have some slough buildup but less so than at prior visits. There is good granulation tissue underlying the slough. Unfortunately the right medial calf wound continues to deteriorate. It has a foul odor today and a lot of necrotic tissue, the surrounding skin is also erythematous and moist. 03/30/2022: The right lateral foot wound continues to contract and just has a little bit of slough and eschar present. The left dorsal foot wound is also smaller with slough overlying good granulation tissue. The right medial calf wound actually looks quite a bit better today; there is no odor and there is much less necrotic tissue although some remains present. We have been using topical gentamicin and he has been taking oral Augmentin. 04/07/2022: The patient saw dermatology at [facility] last week. Unfortunately, it appears that they did not read any of the documentation that was provided. 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 Overall, it was a highly unsatisfactory consultation. In the meantime,  however, we were able to identify compounding pharmacy who could create a topical sodium thiosulfate ointment. The patient has that with him today. Both of the right foot wounds are now closed. The left dorsal foot wound has contracted but still has a layer of  slough on the surface. The medial calf wounds on the right all look a little bit better. They still have heavy slough along with the chunks of calcium. Everything is stained purple. 04/14/2022: The wound on his dorsal left foot is a little bit smaller again today. There is still slough accumulation on the surface. The medial calf wounds have quite a bit less slough accumulation today. His right leg, however, is warm and erythematous, concerning for cellulitis. 04/14/2022: He completed his course of Augmentin yesterday. The medial calf wound sites are much cleaner today and shallower. There is less fragmented calcium in the wounds. He has a lot of lymphedema fluid-like drainage from these wounds, but no purulent discharge or malodor. The wound on his dorsal left foot has also contracted and is shallower. There is a layer of slough over healthy granulation tissue. 05/05/2022: The left dorsal foot wound is smaller today with less slough and more granulation tissue. The right medial calf wounds are shallower, but have extensive slough and some fat necrosis. The drainage has diminished considerably, however. 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 DAMETRIUS, HAGOPIAN ([id number]) [id number] Page 11 of 18 05/12/2022: The left dorsal foot wound continues to contract and fill with granulation tissue. There is slough on the wound surface. The right medial calf wounds also continue to fill with healthy tissue, but there is remaining slough and fat necrosis present. He had a significant increase in his drainage this week and it was a bright yellow-green color. He also had a new wound open over his right anterior tibial surface associated with the spike of cutaneous calcium. The leg is more red, as well, concerning for infection. 05/19/2022: His culture returned positive for  Pseudomonas. He is currently taking a course of oral levofloxacin. We have also been using topical gentamicin mixed in with his sodium thiosulfate. All of the wounds look better this week. 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 There is slough and nonviable subcutaneous tissue still present. The new anterior tibial wound just has a light layer of slough. The dorsal foot wound also has some slough present and appears a little dry today. 05/26/2022: The right medial leg wound continues to improve. It is feeling with granulation tissue, but still accumulates a fairly thick layer of slough on the surface. The more recent anterior tibial wound has remained quite small, but also has some slough on the surface. The left dorsal foot wound has contracted somewhat and has perimeter epithelialization. He completed his oral levofloxacin. 06/02/2022: The left dorsal foot wound continues to improve significantly. There is just a little slough on the wound surface. The right medial leg wound had black drainage, but it looks like silver alginate was applied last week and I theorized that silver sulfide was created with a combination of the silver alginate and the sodium thiosulfate. It did, however, have a bit of an odor to it and extensive slough accumulation. The small anterior tibial wound also had a bit of slough and nonviable subcutaneous tissue present. The skin of his leg was rather macerated, as well. 06/09/2022: The left  dorsal foot wound is smaller again this week with just a light layer of slough on the surface. The right medial leg wound looks substantially better. The leg and periwound skin are no longer red and macerated. There is good granulation tissue forming with less slough and nonviable tissue. The anterior tibial wound just has a small amount of slough accumulation. 06/16/2022: The left dorsal foot wound continues to contract. His wrap slipped a little bit,  however, and his foot is more swollen. The right medial leg wound continues to improve. The underlying tissue is more vital-appearing and there is less slough. He does have substantial drainage. The small anterior tibial wound has a bit of slough accumulation. 06/23/2022: The left dorsal foot wound is about half the size as it was last week. There is just some light slough on the surface and some eschar buildup around the edges. The right anterior leg wound is small with a little slough on the surface. Unfortunately, the right medial leg wound deteriorated fairly substantially. It is malodorous with gray/green/black discoloration. 06/30/2022: The left dorsal foot wound continues to contract. It is nearly closed with just a light layer of slough present. The right anterior leg wound is about the same size and has a small amount of slough on the surface. The right medial leg wound looks a lot better than it did last week. The odor has abated and the tissue is less pulpy and necrotic-looking. There is still a fairly thick layer of slough present. 12/13; the left dorsal foot wound looks very healthy. There was no need to change the dressing here and no debridement. He has a small area on the right anterior lower leg apparently had not changed much in size. The area on the medial leg is the most problematic area this is large with some depth and a completely nonviable surface today. 07/14/2022: The left dorsal foot wound is smaller today with just a little slough and eschar present. The right anterior lower leg wound is about the same without any significant change. The medial leg wound has a black and green surface and is malodorous. No purulent drainage and the periwound skin is intact. Edema control is suboptimal. 07/21/2022: The dorsal foot wound continues to contract and is nearly closed with just a little bit of slough and eschar on the surface. The right anterior lower leg wound is shallower today  but otherwise the dimensions are unchanged. The medial leg wound looks significantly better although it still has a nonviable surface; the odor and black/green surface has resolved. His culture came back with multiple species sensitive to Augmentin, which was prescribed and a very low level of Pseudomonas, which should be handled by the topical gentamicin we have been using. 07/28/2022: The dorsal foot wound is down to just a very tiny opening with a little eschar on the surface. The right anterior tibial wound is about the same with some slough buildup. The right medial leg wound looks quite a bit better this week. There is thick rubbery slough on the surface, but the odor and drainage has improved significantly. 08/04/2022: The dorsal foot wound is almost healed, but the periwound skin has broken down. This appears to be secondary to moisture and adhesive from the absorbent pad. The right anterior tibial wound is shallower. The right medial leg wound continues to improve. It is smaller, still with rubbery slough on the surface. No malodor or purulent drainage. 08/11/2022: The dorsal foot wound is about the same size. It is  a little bit dry with some slough accumulation. The right anterior tibial wound is also unchanged. The right medial leg wound is filling in further with good granulation tissue. Still with some slough buildup on the surface. 08/18/2022: His left leg is bright red, hot, and swollen with cellulitis. The dorsal foot wound actually looks quite good and is superficial with just a little bit of slough accumulation. The right anterior tibial wound is unchanged. The right medial leg wound has filled in with good granulation tissue but has copious amounts of drainage. 08/25/2022: His cellulitis has responded nicely to the oral antibiotics. His dorsal foot wound is smaller but has a fair amount of slough buildup. The right anterior tibial wound remains stable and unchanged. The right medial leg  wound has more granulation tissue under a layer of slough, but continues to have copious drainage. The drainage is actually causing skin irritation and threatens to result in breakdown. 09/01/2022: He continues to have profuse drainage from his wounds which is resulting in tissue maceration on both the dorsum of his left foot and the periwound distal to his medial leg wound. The dorsal foot wound is nearly healed, despite this. The medial right leg wound is filling in with good granulation tissue. We are working on getting home health assistance to change his dressings more frequently to try and control the drainage better. 09/08/2022: We have had success in controlling his drainage. The tissue maceration has improved significantly and his swelling has come down quite markedly. The medial right leg wound is much more superficial and has substantially less nonviable tissue present. The anterior tibial wound is a little bit larger, however with some nonviable tissue present. 09/15/2022: The dorsal foot wound is nearly closed. The anterior tibial wound measures slightly larger today. The medial right leg wound continues to improve quite dramatically with less nonviable tissue and more robust-looking granulation. Edema control is significantly better. 09/22/2022: The left dorsal foot wound remains open, albeit quite tiny. The anterior tibial wound is a little bit larger again today. The medial right leg wound continues to improve with an increase in robust and healthy granulation tissue and less accumulation of nonviable tissue. 09/29/2022: His home health providers wrapped his left leg extremely tightly and he ended up having to cut the wrap off of his foot. This resulted in his foot being very swollen and that the dorsal foot wound is a little bit larger today. The anterior tibial wound is about the same size with a little slough on the surface. The medial right leg wound continues to fill in. There are very  few areas with any significant depth. There is still slough accumulation. 3/13; Patient has a left dorsal foot wound along with a right anterior tibial wound and right medial wound. We are using silver alginate to the left dorsal foot wound, Santyl and sodium thiosulfate compound ointment to the right lower extremity wounds under 4 layer compression. Patient has no issues or complaints today. 10/13/2022: The left dorsal foot wound is a little bit bigger today, but his foot is also a little bit more swollen. The right medial leg wound has filled in more and has substantially less depth. There is still some nonviable tissue present. The small anterior lower leg wound looks about the same. DAVYON, BROSZ ([id number]) [id number] Page 12 of 18 10/20/2022: The left dorsal foot wound is epithelializing. There does not appear to be much open at this site. There is some eschar around the edges. The right medial  leg wound continues to fill in with granulation tissue. The small anterior lower leg wound is a little bit bigger and there is a chunk of calcium protruding. He continues to have fairly copious drainage from the right leg. 10/27/2022: His home health agency failed to show up at all this past week and as a result, the excessive drainage from his right leg wound has resulted in tissue breakdown. His leg is red and irritated. The wounds themselves look about the same. The wound on his left dorsal foot is nearly closed underneath some eschar. 11/03/2022: We continue to have difficulty with his home health agency. He continues to have significant drainage from his right leg wound which has resulted in even further tissue breakdown. He also was not taking his Lasix as prescribed and I think much of the drainage has been his lymphedema fluid choosing the path of least resistance. The wound on his left dorsal foot is closed, but in the process of cleaning the site, dry skin was pulled off  and he has a new small superficial wound just adjacent to where the dorsal foot wound was. 11/12/2022: His left dorsal foot is completely healed. The right medial leg continues to deteriorate. He is not taking his Lasix as prescribed, nor is he using his lymphedema pumps at all. As a result, he has massive amounts of drainage saturating his dressings and causing further tissue breakdown on his medial leg. Today, his dressings had a foul odor, as well. 11/19/2022: His right medial leg looks even worse today. There is more erythema and moisture related tissue breakdown. The 2 anterior wounds look a little bit better. The culture that I took last week did produce Pseudomonas and he is currently taking levofloxacin for this. 11/26/2022: There has been significant improvement to his right medial leg. It is far less raw and many of the superficial open areas have epithelialized. He continues to cut the foot portion of his wrap and as result his foot is bulbous and swollen compared to the rest of his leg, where edema control is good. The smaller of the 2 anterior tibial wound is closed, while the larger is contracting somewhat with a bit of slough at the base. 12/03/2022: His leg continues to demonstrate improvement. There has been epithelialization of much of the denuded area. He has a new small anterior tibial wound that has opened around a nidus of calcium. Edema control is improved. 12/10/2022: Continued improvement in the periwound skin. The anterior tibial wounds are about the same, but the medial leg wound has improved further. There is less nonviable tissue present. 12/17/2022: Between his visit on Tuesday with the nurse and today, he has had significantly more drainage which has resulted in some periwound tissue maceration. I suspect this correlates somewhat with his diuretic use, as he says he takes his diuretic religiously on Saturday, Sunday, and Monday and then does not take it after Tuesday due to  being out and about and not being close to easy restroom access. I suspect the excess fluid is just simply draining out of his leg. 12/21/2022: This was supposed to be a nurse visit, but his leg has broken down so significantly, that we have converted to a wound care visit. He did take his diuretics over the weekend, but despite this, he has had copious drainage and all of his tissues have broken down substantially from the right medial leg ulcer all the way down to his ankle and even wrapping posterior to this. There  is blue-green staining on his dressing, suggesting ongoing presence of Pseudomonas aeruginosa. 12/24/2022: The wound already looks better than it did 2 days ago. He has been taking ciprofloxacin and the Urgo clean dressing we applied did a much better job of wicking the drainage away from his leg. There is slough on the entire surface of the medial leg wound, but the erythema and induration has improved in the past couple of days. 12/31/2022: Continued improvement of the wound. He says that the pain has basically abated since being on ciprofloxacin. Still with fairly heavy drainage and slough accumulation. 01/07/2023: Overall continued improvement. Still with heavy slough buildup, but less drainage. 01/14/2023: There is a little bit of improvement. The skin is still looking a bit excoriated. He has a few more days left of ciprofloxacin. 01/21/2023: There has been significant improvement. The periwound skin is in much better condition. The ulcers are shallower and actually have some good granulation tissue present under some slough. 01/28/2023: The wound continues to improve. All of the open areas measured smaller today. There is slough accumulation on the surfaces. His drainage has decreased markedly. 02/04/2023: Continued improvement in the wound. Drainage remains significantly diminished. Slough accumulation is present but to a lesser extent. Periwound skin continues to improve. Edema control  is good. 02/18/2023: The heavy drainage persists, although it is was not as significant today as it was at his nurse visit on Tuesday. The drainage is yellow and does not have the blue-green discoloration suggestive of Pseudomonas. The actual wounds on his leg are getting smaller, but the periwound skin is quite excoriated. 02/25/2023: His periwound skin is quite excoriated and he has a lot of clear yellow drainage. The main wounds are filling in further and epithelializing. 03/04/2023: The excoriation of the periwound skin has improved. Most of that area has dried up. The main wounds have less slough in them today. 03/11/2023: The main wounds have accumulated a little bit of slough. The periwound excoriation continues to improve. Unfortunately, he suffered a fall over the previous weekend and has abrasions on his knees bilaterally and on his right wrist and upper arm. In addition, the skin on his left lower leg apparently blistered and opened, leaving several superficial wounds on this extremity. He has been approved for the [facility] wound VAC and has the equipment with him today. 03/18/2023: He has extensive excoriation to the periwound skin on the right medial lower leg. The actual wounds themselves are smaller with slough accumulation. The top of his left foot has begun weeping again. Using the [facility] wound VAC, he says that he through 4 canisters. He seems to be very fluid overloaded, without any significant dyspnea. 03/25/2023: The wound to his left anterior tibial surface has healed. The left dorsal foot wound has gotten smaller and has a small area of hypertrophic granulation tissue present. The periwound skin on the right medial lower leg looks slightly better, but still appears excoriated. The actual wounds continue to contract but are still accumulating quite a bit of slough. The tiny right anterior leg wounds are contracting. 04/01/2023: The left dorsal foot wound got a little bit  bigger this week. There is slough accumulation. The periwound skin on the right medial lower leg continues to improve. The main medial right lower leg wounds have a dark gray discoloration and there is a strong odor coming from them. The back of his right lower leg just above the ankle also has a little bit of new breakdown where  he is draining edema fluid. The small anterior leg wounds are stable. 04/08/2023: Since starting antibiotics last week, there has been tremendous improvement. The left dorsal foot wound is nearly closed with just a little bit of eschar on the surface. The right medial lower leg is less excoriated and red and he has had substantially less drainage. The amount of slough on the open areas of his wounds has decreased markedly and there is no longer any odor. The back of his right lower leg that was newly open last week has dried up, but is not completely closed yet. 04/15/2023: The improvement continues with significantly less drainage, resulting in less excoriation and irritation. The back of his right lower leg is closed, as is the left dorsal foot wound. The open areas on the right medial leg are smaller. KEMARIAN, AREVALO ([id number]) [id number] Page 13 of 18 04/22/2023: His drainage has diminished significantly. The tissue maceration and excoriation surrounding the right medial leg wound has also improved substantially. There is slough on all of the open wound surfaces. He does have a bit of an odor coming from his wound today, but there is no purulent drainage nor any significant discoloration of the drainage in his VAC. 04/29/2023: He has a new wound on his left lower anterior tibial surface. This was a blister that came up and then ruptured. There is thin slough on the wound surface but no concern for infection. He has had significantly diminished drainage from his right medial leg ulcers. He was previously going through 4 canisters a day secondary to  drainage and this week, he has used only 1 for the entire week. The open areas on his right medial leg are smaller and fewer in number. There is slough on all of the surfaces. No malodor appreciated. 05/06/2023: The wound on his left anterior tibial surface has epithelialized significantly. There is just a small area open in the center. The wound is clean without any slough or other debris accumulation. The drainage on the right leg has almost completely dry. There is slough on the open wound surfaces with significantly more epithelialized between the open sites. He continues on levofloxacin. 05/13/2023: His left lower leg wound is healed. The right lower leg wound continues to improve. The drainage has been minimal and he has not been in the wound VAC for over a week now. There is better skin filling between the open areas and less slough accumulation on the open surfaces. 05/20/2023: The anterior leg wounds are closing up and there is no slough on the open areas that remain. His medial leg wound was measured longer today, but this appears to include some stuck on silver alginate and dry eschar, rather than the actual open wound areas. There is slough accumulation on the open surfaces. Edema control is good and his drainage has not been excessive. 05/27/2023: His wrap slipped up on his foot and he has a new small ulcer on the dorsal aspect of his right foot. The other wounds are basically stable with the usual thick slough accumulation. Drainage remains well-controlled. Edema control is good. 06/03/2023: The dorsal foot ulcer is healed. The other wounds actually are quite a bit smaller this week. There is less depth to the large medial leg ulcers. Edema control is excellent. 06/10/2023: He has had a new wound open on his left lower leg, secondary to friction from his compression wrap. The medial leg ulcers are all smaller; the anterior wounds were measured larger, but this appears to be  due to some  minor moisture-related breakdown. He continues on levofloxacin. 06/17/2023: The wound on his left lower leg is nearly closed. There is just a small open area that remains underneath some eschar and dried silver alginate. The medial leg ulcers continue to improve with greater areas of epithelialization between each of the open sites. There is some slough accumulation here. No real change to the anterior leg wounds. Unfortunately, when his wrap was being removed, he was cut with the nurse's scissors so he has a new small wound on his upper right lower leg just below the knee. Patient History Information obtained from Patient, Chart. Family History Cancer - Mother, Heart Disease - Father,Mother, Hypertension - Father,Siblings, Lung Disease - Siblings, No family history of Diabetes, Hereditary Spherocytosis, Kidney Disease, Seizures, Stroke, Thyroid Problems, Tuberculosis. Social History Former smoker - quit 1991, Marital Status - Married, Alcohol Use - Moderate, Drug Use - No History, Caffeine Use - Daily - coffee. Medical History Eyes Patient has history of Cataracts - right eye removed Denies history of Glaucoma, Optic Neuritis Ear/Nose/Mouth/Throat Denies history of Chronic sinus problems/congestion, Middle ear problems Hematologic/Lymphatic Patient has history of Anemia - macrocytic, Lymphedema Cardiovascular Patient has history of Congestive Heart Failure, Deep Vein Thrombosis - left leg, Hypertension, Peripheral Venous Disease Endocrine Denies history of Type I Diabetes, Type II Diabetes Genitourinary Denies history of End Stage Renal Disease Integumentary (Skin) Denies history of History of Burn Oncologic Denies history of Received Chemotherapy, Received Radiation Psychiatric Denies history of Anorexia/bulimia, Confinement Anxiety Hospitalization/Surgery History - cervical fusion. - lumbar surgery. - coronary stent placement. - lasik eye surgery. - hydrocele excision. Medical A  Surgical History Notes nd Constitutional Symptoms (General Health) obesity Ear/Nose/Mouth/Throat hard of hearing Respiratory frozen diaphragm right Cardiovascular hyperlipidemia Musculoskeletal myositis, lumbar DDD, spinal stenosis, cervical facet joint syndrome Objective MASAAKI, STURGELL ([id number]) [id number] Page 14 of 18 Constitutional no acute distress. Vitals Time Taken: 11:15 AM, Height: 71 in, Weight: 267 lbs, BMI: 37.2, Temperature: 97.5 F, Pulse: 66 bpm, Respiratory Rate: 16 breaths/min, Blood Pressure: 131/77 mmHg. Respiratory Normal work of breathing on room air.. General Notes: 06/17/2023: The wound on his left lower leg is nearly closed. There is just a small open area that remains underneath some eschar and dried silver alginate. The medial leg ulcers continue to improve with greater areas of epithelialization between each of the open sites. There is some slough accumulation here. No real change to the anterior leg wounds. Unfortunately, when his wrap was being removed, he was cut with the nurse's scissors so he has a new small wound on his upper right lower leg just below the knee. Integumentary (Hair, Skin) Wound #12 status is Open. Original cause of wound was Gradually Appeared. The date acquired was: 05/27/2023. The wound has been in treatment 1 weeks. The wound is located on the Left,Anterior Lower Leg. The wound measures 0cm length x 0cm width x 0cm depth; 0cm^2 area and 0cm^3 volume. There is no tunneling or undermining noted. There is a none present amount of drainage noted. The wound margin is distinct with the outline attached to the wound base. There is no granulation within the wound bed. There is no necrotic tissue within the wound bed. The periwound skin appearance did not exhibit: Callus, Crepitus, Excoriation, Induration, Rash, Scarring, Dry/Scaly, Maceration, Atrophie Blanche, Cyanosis, Ecchymosis, Hemosiderin Staining, Mottled,  Pallor, Rubor, Erythema. Periwound temperature was noted as No Abnormality. Wound #3 status is Open. Original cause of wound was Gradually Appeared. The date acquired was: 12/07/2021.  The wound has been in treatment 70 weeks. The wound is located on the Right,Medial Lower Leg. The wound measures 3cm length x 4cm width x 0.2cm depth; 9.425cm^2 area and 1.885cm^3 volume. There is Fat Layer (Subcutaneous Tissue) exposed. There is no tunneling or undermining noted. There is a medium amount of serous drainage noted. The wound margin is flat and intact. There is small (1-33%) red granulation within the wound bed. There is a large (67-100%) amount of necrotic tissue within the wound bed including Eschar and Adherent Slough. The periwound skin appearance had no abnormalities noted for texture. The periwound skin appearance had no abnormalities noted for moisture. The periwound skin appearance exhibited: Hemosiderin Staining, Rubor. The periwound skin appearance did not exhibit: Erythema. Periwound temperature was noted as No Abnormality. Wound #6 status is Open. Original cause of wound was Gradually Appeared. The date acquired was: 05/12/2022. The wound has been in treatment 57 weeks. The wound is located on the Right,Anterior Lower Leg. The wound measures 0.2cm length x 0.6cm width x 0.2cm depth; 0.094cm^2 area and 0.019cm^3 volume. There is Fat Layer (Subcutaneous Tissue) exposed. There is no tunneling or undermining noted. There is a small amount of serous drainage noted. The wound margin is epibole. There is small (1-33%) pink granulation within the wound bed. There is a large (67-100%) amount of necrotic tissue within the wound bed. The periwound skin appearance had no abnormalities noted for texture. The periwound skin appearance had no abnormalities noted for moisture. The periwound skin appearance exhibited: Hemosiderin Staining. The periwound skin appearance did not exhibit: Atrophie Blanche, Cyanosis,  Ecchymosis, Mottled, Pallor, Rubor, Erythema. Periwound temperature was noted as No Abnormality. 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 There was a Selective/Open Wound Non-Viable Tissue Debridement with a total area of 0.03 sq cm performed by Duanne Guess, MD. With the following instrument(s): Curette to remove Non-Viable tissue/material. Material removed includes Eschar and Fibrin/Exudate and after achieving pain control using Lidocaine 4% T opical Solution. No specimens were taken. A time out was conducted at 11:40, prior to the start of the procedure. A Minimum amount of bleeding was controlled with Pressure. The procedure was tolerated well with a pain level of 0 throughout and a pain level of 0 following the procedure. Post Debridement Measurements: 0.2cm length x 0.2cm width x 0.1cm depth; 0.003cm^3 volume. Character of Wound/Ulcer Post Debridement is improved. Severity of Tissue Post Debridement is: Fat layer exposed. Post procedure Diagnosis Wound #12: Same as Pre-Procedure Pre-procedure diagnosis of Wound #12 is a Lymphedema located on the Left,Anterior Lower Leg . There was a Three Layer Compression Therapy Procedure by Zenaida Deed, RN. Post procedure Diagnosis Wound #12: Same as Pre-Procedure Wound #3 Pre-procedure diagnosis of Wound #3 is a Lymphedema located on the Right,Medial  Lower Leg . There was a Excisional Skin/Subcutaneous Tissue Debridement with a total area of 7.06 sq cm performed by Duanne Guess, MD. With the following instrument(s): Curette to remove Viable and Non-Viable tissue/material. Material removed includes Eschar, Subcutaneous Tissue, and Slough after achieving  pain control using Lidocaine 4% T opical Solution. No specimens were taken. A time out was conducted at 11:40, prior to the start of the procedure. A Minimum amount of bleeding was controlled with Pressure. The procedure was tolerated well with a pain level of 0 throughout and a pain level of 0 following the procedure. Post Debridement Measurements: 3cm length x 4cm width x 0.2cm depth; 1.885cm^3 volume. Character of Wound/Ulcer Post Debridement is improved. AYLIN, BECKSTRAND ([id number]) [id number] Page 15 of 18 Post procedure Diagnosis Wound #3: Same as Pre-Procedure Pre-procedure diagnosis of Wound #3 is a Lymphedema located on the Right,Medial Lower Leg . There was a Three Layer Compression Therapy Procedure by Zenaida Deed, RN. Post procedure Diagnosis Wound #3: Same as Pre-Procedure Plan Follow-up Appointments: Return Appointment in 1 week. - RN visit 11/27 Return Appointment in 2 weeks. - RN visit Return appointment in 3 weeks. - RN visit Return appointment in 1 month. - Dr. [name] RM 3 Friday 12/20 @ 0830 am Anesthetic: (In clinic) Topical Lidocaine 4% applied to wound bed Bathing/ Shower/ Hygiene: May shower with protection but do not get wound dressing(s) wet. Protect dressing(s) with water repellant cover (for example, large plastic bag) or a cast cover and may then take shower. Lymphedema Treatment Plan - Exercise, Compression and Elevation: Exercise daily as tolerated. (Walking, ROM, Calf Pumps and T T oe aps) Compression Garments, Class II. Wear daily when out of bed. May remove at bedtime Compression Wraps as ordered Elevate legs 30 - 60  minutes at or above heart level at least 3 - 4 times daily as able/tolerated Avoid standing for long periods and elevate leg(s) parallel to the floor when sitting Use Pneumatic Compression Device on leg(s) 2-3 times a day for 45-60 minutes WOUND #12: - Lower Leg Wound Laterality: Left, Anterior Cleanser: Soap and Water 1 x Per Week/30 Days Discharge Instructions: May shower and wash wound with dial antibacterial soap and water prior to dressing change. Cleanser: Wound Cleanser 1 x Per Week/30 Days Discharge Instructions: Cleanse the wound with wound cleanser prior to applying a clean dressing using gauze sponges, not tissue or cotton balls. Peri-Wound Care: Sween Lotion (Moisturizing lotion) 1 x Per Week/30 Days Discharge Instructions: Apply moisturizing lotion as directed Prim Dressing: Maxorb Extra Ag+ Alginate Dressing, 2x2 (in/in) 1 x Per Week/30 Days ary Discharge Instructions: Apply to wound bed as instructed Secondary Dressing: ABD Pad, 8x10 1 x Per Week/30 Days Discharge Instructions: Apply over primary dressing as directed. Com pression Wrap: ThreePress (3 layer compression wrap) 1 x Per Week/30 Days Discharge Instructions: Apply three layer compression as directed. WOUND #3: - Lower Leg Wound Laterality: Right, Medial Cleanser: Soap and Water 1 x Per Week/30 Days Discharge Instructions: May shower and wash wound with dial antibacterial soap and water prior to dressing change. Cleanser: Wound Cleanser 1 x Per Week/30 Days Discharge Instructions: Cleanse the wound with wound cleanser prior to applying a clean dressing using gauze sponges, not tissue or cotton balls. Peri-Wound Care: Skin Prep 1 x Per Week/30 Days Discharge Instructions: cavelon to macerated wound margins Peri-Wound Care: Sween Lotion (Moisturizing lotion) 1 x Per Week/30 Days Discharge Instructions: Apply moisturizing lotion as directed Prim Dressing: Maxorb Extra Ag+ Alginate Dressing, 4x4.75 (in/in) 1 x Per  Week/30 Days ary Discharge Instructions: Apply to excoriated weeping areas Secondary Dressing: ABD Pad, 8x10 1 x Per Week/30 Days Discharge Instructions: Apply over primary dressing as directed. Com pression Wrap: ThreePress (3 layer compression wrap) 1 x Per Week/30 Days Discharge  Instructions: Apply three layer compression as directed. Com pression Wrap: Unnaboot w/Calamine, 4x10 (in/yd) 1 x Per Week/30 Days Discharge Instructions: Apply Unnaboot on distal foot and top of lower leg WOUND #6: - Lower Leg Wound Laterality: Right, Anterior Cleanser: Soap and Water 1 x Per Week/30 Days Discharge Instructions: May shower and wash wound with dial antibacterial soap and water prior to dressing change. Cleanser: Wound Cleanser 1 x Per Week/30 Days Discharge Instructions: Cleanse the wound with wound cleanser prior to applying a clean dressing using gauze sponges, not tissue or cotton balls. Peri-Wound Care: Sween Lotion (Moisturizing lotion) 1 x Per Week/30 Days Discharge Instructions: Apply moisturizing lotion as directed Prim Dressing: Maxorb Extra Ag+ Alginate Dressing, 2x2 (in/in) 1 x Per Week/30 Days ary Discharge Instructions: Apply to wound bed as instructed Secondary Dressing: ABD Pad, 8x10 1 x Per Week/30 Days Discharge Instructions: Apply over primary dressing as directed. Com pression Wrap: ThreePress (3 layer compression wrap) 1 x Per Week/30 Days Discharge Instructions: Apply three layer compression as directed. 06/17/2023: The wound on his left lower leg is nearly closed. There is just a small open area that remains underneath some eschar and dried silver alginate. The medial leg ulcers continue to improve with greater areas of epithelialization between each of the open sites. There is some slough accumulation here. No real change to the anterior leg wounds. Unfortunately, when his wrap was being removed, he was cut with the nurse's scissors so he has a new small wound on his upper  right lower leg just below the knee. I used a curette to debride eschar and slough from the left lower leg. I debrided slough and subcutaneous tissue from the medial right lower leg. We will continue silver alginate and 3 layer compression wraps. 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 He will have nurse visits for wrap changes for the next couple of weeks due to provider availability. I will see him in 1 month. BOB, LAGRAVE ([id number]) [id number] Page 16 of 18 Electronic Signature(s) Signed: 06/17/2023 12:29:02 PM By: Duanne Guess MD FACS Entered By: Duanne Guess on 06/17/2023 12:29:01 -------------------------------------------------------------------------------- HxROS Details Patient Name: Date of Service: [name] 06/17/2023 11:00 A M Medical Record Number: [id number] Patient Account Number: [id number] Date of Birth/Sex: Treating RN: 1950/08/19 (73 y.o. M) Primary Care Provider: [name] Other Clinician: Referring Provider: Treating Provider/Extender: [name] in Treatment: 68 Information Obtained From Patient Chart Constitutional Symptoms (General Health) Medical History: Past Medical History Notes: obesity Eyes Medical History: Positive for: Cataracts - right eye removed Negative for: Glaucoma; Optic Neuritis Ear/Nose/Mouth/Throat Medical History: Negative for: Chronic sinus problems/congestion; Middle ear problems Past Medical History Notes: hard of hearing Hematologic/Lymphatic Medical History: Positive for: Anemia - macrocytic; Lymphedema Respiratory Medical History: Past Medical History Notes: frozen diaphragm right Cardiovascular Medical History: Positive for: Congestive Heart Failure; Deep Vein Thrombosis - left leg; Hypertension; Peripheral Venous Disease Past  Medical History Notes: hyperlipidemia Endocrine Medical History: Negative for: Type I Diabetes; Type II Diabetes Genitourinary Medical History: Negative for: End Stage Renal Disease Integumentary (Skin) Medical History: Negative for: History of Burn Musculoskeletal MATIX, SADBERRY ([id number]) [id number] Page 17 of 18 Medical History: Past Medical History Notes: myositis, lumbar DDD, spinal stenosis, cervical facet joint syndrome Oncologic Medical History: Negative for: Received Chemotherapy; Received Radiation Psychiatric Medical History: Negative for: Anorexia/bulimia; Confinement Anxiety HBO Extended History Items Eyes: Cataracts  Immunizations Pneumococcal Vaccine: Received Pneumococcal Vaccination: Yes Received Pneumococcal Vaccination On or After 60th Birthday: Yes Implantable Devices No devices added Hospitalization / Surgery History Type of Hospitalization/Surgery cervical fusion lumbar surgery coronary stent placement lasik eye surgery hydrocele excision Family and Social History Cancer: Yes - Mother; Diabetes: No; Heart Disease: Yes - Father,Mother; Hereditary Spherocytosis: No; Hypertension: Yes - Father,Siblings; Kidney Disease: No; Lung Disease: Yes - Siblings; Seizures: No; Stroke: No; Thyroid Problems: No; Tuberculosis: No; Former smoker - quit 1991; Marital Status - Married; Alcohol Use: Moderate; Drug Use: No History; Caffeine Use: Daily - coffee; Financial Concerns: No; Food, Clothing or Shelter Needs: No; Support System Lacking: No; Transportation Concerns: No Electronic Signature(s) Signed: 06/17/2023 12:30:01 PM By: Duanne Guess MD FACS Entered By: Duanne Guess on 06/17/2023 12:25:12 -------------------------------------------------------------------------------- SuperBill Details Patient Name: Date of Service: BRION, PRUET 06/17/2023 Medical Record Number: [id number] Patient Account Number: [id number] Date of  Birth/Sex: Treating RN: [birth year] (72 y.o. M) Primary Care Provider: [name] Other Clinician: Referring Provider: Treating Provider/Extender: [name] in Treatment: 53 Diagnosis Coding ICD-10 Codes Code Description [id number] Non-pressure chronic ulcer of other part of right lower leg with fat layer exposed L97.822 Non-pressure chronic ulcer of other part of left lower leg with fat layer exposed 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.0 Lymphedema, not elsewhere classified I10 Essential (primary) hypertension I25.10 Atherosclerotic heart disease of native coronary artery without angina pectoris [name] ([id number]) [id number] Page 18 of 18 I87.2 Venous insufficiency (chronic) (peripheral) Z86.718 Personal history of other venous thrombosis and embolism Facility Procedures : CPT4 Code: 55732202 Description: 11042 - DEB SUBQ TISSUE 20 SQ CM/< ICD-10 Diagnosis Description 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 Modifier: ed Quantity: 1 : CPT4 Code: 54270623 Description: 97597 - DEBRIDE WOUND 1ST 20 SQ CM OR < ICD-10 Diagnosis Description L97.822 Non-pressure chronic ulcer of other part of left lower leg with fat layer expose Modifier: d Quantity: 1 Physician Procedures : CPT4 Code Description Modifier [id number] 99214 - WC PHYS LEVEL 4 - EST PT ICD-10 Diagnosis Description 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ed L97.822 Non-pressure chronic ulcer of other part of left  lower leg with fat layer exposed 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.0 Lymphedema, not elsewhere classified Quantity: 1 : [id number] 11042 - WC PHYS SUBQ TISS 20 SQ CM ICD-10 Diagnosis Description 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ed Quantity: 1 : [id number] 97597 - WC PHYS DEBR WO ANESTH 20 SQ CM ICD-10 Diagnosis Description  L97.822 Non-pressure chronic ulcer of other part of left lower leg with fat layer exposed Quantity: 1 Electronic Signature(s) Signed: 06/17/2023 12:29:30 PM By: Duanne Guess MD FACS Entered By: Duanne Guess on 06/17/2023 12:29:30

## 2023-06-22 ENCOUNTER — Encounter: Payer: Self-pay | Admitting: Family Medicine

## 2023-06-22 ENCOUNTER — Encounter (HOSPITAL_BASED_OUTPATIENT_CLINIC_OR_DEPARTMENT_OTHER): Payer: Medicare Other | Admitting: Physician Assistant

## 2023-06-22 DIAGNOSIS — L942 Calcinosis cutis: Secondary | ICD-10-CM | POA: Diagnosis not present

## 2023-06-22 DIAGNOSIS — L97812 Non-pressure chronic ulcer of other part of right lower leg with fat layer exposed: Secondary | ICD-10-CM | POA: Diagnosis not present

## 2023-06-22 DIAGNOSIS — I110 Hypertensive heart disease with heart failure: Secondary | ICD-10-CM | POA: Diagnosis not present

## 2023-06-22 DIAGNOSIS — L97512 Non-pressure chronic ulcer of other part of right foot with fat layer exposed: Secondary | ICD-10-CM | POA: Diagnosis not present

## 2023-06-22 DIAGNOSIS — I509 Heart failure, unspecified: Secondary | ICD-10-CM | POA: Diagnosis not present

## 2023-06-22 DIAGNOSIS — L97822 Non-pressure chronic ulcer of other part of left lower leg with fat layer exposed: Secondary | ICD-10-CM | POA: Diagnosis not present

## 2023-06-22 MED ORDER — TAMSULOSIN HCL 0.4 MG PO CAPS: 0.4000 mg | ORAL_CAPSULE | Freq: Every day | ORAL | 1 refills | Status: DC

## 2023-06-22 NOTE — Telephone Encounter (Signed)
Great! New Flomax prescription sent.

## 2023-06-27 DIAGNOSIS — R600 Localized edema: Secondary | ICD-10-CM | POA: Diagnosis not present

## 2023-06-27 DIAGNOSIS — I10 Essential (primary) hypertension: Secondary | ICD-10-CM | POA: Diagnosis not present

## 2023-06-27 DIAGNOSIS — Z79899 Other long term (current) drug therapy: Secondary | ICD-10-CM | POA: Diagnosis not present

## 2023-06-27 NOTE — Progress Notes (Signed)
TANER, MCCLENDON ([id number]) [id number] Page 1 of 7 Visit Report for 06/22/2023 Arrival Information Details Patient Name: Date of Service: [name], [name] 06/22/2023 1:00 PM Medical Record Number: [id number] Patient Account Number: [id number] Date of Birth/Sex: Treating RN: [birth year] (72 y.o. [name] Primary Care Saleema Weppler: [name] Other Clinician: Referring Evangelia Whitaker: Treating Huldah Marin/Extender: Adele Dan in Treatment: 74 Visit Information History Since Last Visit Added or deleted any medications: No Patient Arrived: Wheel Chair Any new allergies or adverse reactions: No Arrival Time: 13:05 Had a fall or experienced change in No Accompanied By: self activities of daily living that may affect Transfer Assistance: None risk of falls: Patient Identification Verified: Yes Signs or symptoms of abuse/neglect since last visito No Patient Requires Transmission-Based Precautions: No Hospitalized since last visit: No Patient Has Alerts: Yes Implantable device outside of the clinic excluding No Patient Alerts: R ABI= N/C, TBI= .75 cellular tissue based products placed in the center L ABI =N/C, TBI = .57 since last visit: Pain Present Now: No Electronic Signature(s) Signed: 06/27/2023 12:28:15 PM By: Karie Schwalbe RN Entered By: Karie Schwalbe on 06/22/2023 10:05:31 -------------------------------------------------------------------------------- Compression Therapy Details Patient Name: Date of Service: [name], [name] 06/22/2023 1:00 PM Medical Record Number: [id number] Patient Account Number: [id number] Date of Birth/Sex: Treating RN: 10/01/1950 (72 y.o. [name] Primary Care Chaddrick Brue: [name] Other Clinician: Referring Colletta Spillers: Treating Priscilla Kirstein/Extender: Adele Dan in Treatment: 42 Compression Therapy Performed for Wound Assessment: Wound #12 Left,Anterior Lower  Leg Performed By: Clinician Karie Schwalbe, RN Compression Type: Three Layer Electronic Signature(s) Signed: 06/27/2023 12:28:15 PM By: Karie Schwalbe RN Entered By: Karie Schwalbe on 06/22/2023 10:25:52 -------------------------------------------------------------------------------- Compression Therapy Details Patient Name: Date of Service: [name], [name] 06/22/2023 1:00 PM Medical Record Number: [id number] Patient Account Number: [id number] Date of Birth/Sex: Treating RN: October 09, 1950 (72 y.o. [name] Primary Care Tatanisha Cuthbert: [name] Other Clinician: [name] ([id number]) [id number] Page 2 of 7 Referring Rether Rison: Treating Jacci Ruberg/Extender: Adele Dan in Treatment: 52 Compression Therapy Performed for Wound Assessment: Wound #3 Right,Medial Lower Leg Performed By: Clinician Karie Schwalbe, RN Compression Type: Three Layer Electronic Signature(s) Signed: 06/27/2023 12:28:15 PM By: Karie Schwalbe RN Entered By: Karie Schwalbe on 06/22/2023 10:25:52 -------------------------------------------------------------------------------- Compression Therapy Details Patient Name: Date of Service: [name], [name] 06/22/2023 1:00 PM Medical Record Number: [id number] Patient Account Number: [id number] Date of Birth/Sex: Treating RN: 25-Dec-1950 (72 y.o. [name] Primary Care Tamikka Pilger: [name] Other Clinician: Referring Edis Huish: Treating Abie Killian/Extender: Adele Dan in Treatment: 71 Compression Therapy Performed for Wound Assessment: Wound #6 Right,Anterior Lower Leg Performed By: Clinician Karie Schwalbe, RN Compression Type: Three Layer Electronic Signature(s) Signed: 06/27/2023 12:28:15 PM By: Karie Schwalbe RN Entered By: Karie Schwalbe on 06/22/2023 10:25:52 -------------------------------------------------------------------------------- Encounter Discharge  Information Details Patient Name: Date of Service: [name] 06/22/2023 1:00 PM Medical Record Number: [id number] Patient Account Number: [id number] Date of Birth/Sex: Treating RN: 21-Aug-1950 (72 y.o. [name] Primary Care Francyne Arreaga: [name] Other Clinician: Referring Jagger Demonte: Treating Timera Windt/Extender: Adele Dan in Treatment: 24 Encounter Discharge Information Items Discharge Condition: Stable Ambulatory Status: Wheelchair Discharge Destination: Home Transportation: Private Auto Accompanied By: self Schedule Follow-up Appointment: Yes Clinical Summary of Care: Patient Declined Electronic Signature(s) Signed: 06/27/2023 12:28:15 PM By: Karie Schwalbe RN Entered By: Karie Schwalbe on 06/22/2023 10:27:34 [name] ([id number]) [id number] Page 3 of 7 -------------------------------------------------------------------------------- Patient/Caregiver Education Details Patient Name: Date of Service: [name],  [name] 11/27/2024andnbsp1:00 PM Medical Record Number: [id number] Patient Account Number: [id number] Date of Birth/Gender: Treating RN: 16-Nov-1950 (72 y.o. [name] Primary Care Physician: [name] Other Clinician: Referring Physician: Treating Physician/Extender: Adele Dan in Treatment: 54 Education Assessment Education Provided To: Patient Education Topics Provided Wound/Skin Impairment: Methods: Demonstration, Explain/Verbal Responses: State content correctly Electronic Signature(s) Signed: 06/27/2023 12:28:15 PM By: Karie Schwalbe RN Entered By: Karie Schwalbe on 06/22/2023 10:26:23 -------------------------------------------------------------------------------- Wound Assessment Details Patient Name: Date of Service: [name], [name] 06/22/2023 1:00 PM Medical Record Number: [id number] Patient Account Number: [id number] Date of Birth/Sex: Treating  RN: May 26, 1951 (72 y.o. [name] Primary Care Hareem Surowiec: [name] Other Clinician: Referring Mack Alvidrez: Treating Shadia Larose/Extender: Adele Dan in Treatment: 67 Wound Status Wound Number: 12 Primary Lymphedema Etiology: Wound Location: Left, Anterior Lower Leg Secondary Venous Leg Ulcer Wounding Event: Gradually Appeared Etiology: Date Acquired: 05/27/2023 Wound Open Weeks Of Treatment: 1 Status: Clustered Wound: No Comorbid Cataracts, Anemia, Lymphedema, Congestive Heart Failure, History: Deep Vein Thrombosis, Hypertension, Peripheral Venous Disease Wound Measurements Length: (cm) Width: (cm) Depth: (cm) Area: (cm) Volume: (cm) 0 % Reduction in Area: 100% 0 % Reduction in Volume: 100% 0 Epithelialization: Large (67-100%) 0 Tunneling: No 0 Undermining: No Wound Description Classification: Full Thickness Without Exposed Support Structures Wound Margin: Distinct, outline attached Exudate Amount: None Present Foul Odor After Cleansing: No Slough/Fibrino No Wound Bed Granulation Amount: None Present (0%) Exposed Structure Necrotic Amount: None Present (0%) Fascia Exposed: No Fat Layer (Subcutaneous Tissue) Exposed: No Tendon Exposed: No Muscle Exposed: No Joint Exposed: No Bone Exposed: No [name], [name] ([id number]) [id number] Page 4 of 7 Periwound Skin Texture Texture Color No Abnormalities Noted: No No Abnormalities Noted: No Callus: No Atrophie Blanche: No Crepitus: No Cyanosis: No Excoriation: No Ecchymosis: No Induration: No Erythema: No Rash: No Hemosiderin Staining: No Scarring: No Mottled: No Pallor: No Moisture Rubor: No No Abnormalities Noted: No Dry / Scaly: No Temperature / Pain Maceration: No Temperature: No Abnormality Treatment Notes Wound #12 (Lower Leg) Wound Laterality: Left, Anterior Cleanser Soap and Water Discharge Instruction: May shower and wash wound with dial  antibacterial soap and water prior to dressing change. Wound Cleanser Discharge Instruction: Cleanse the wound with wound cleanser prior to applying a clean dressing using gauze sponges, not tissue or cotton balls. Peri-Wound Care Sween Lotion (Moisturizing lotion) Discharge Instruction: Apply moisturizing lotion as directed Topical Primary Dressing Maxorb Extra Ag+ Alginate Dressing, 2x2 (in/in) Discharge Instruction: Apply to wound bed as instructed Secondary Dressing ABD Pad, 8x10 Discharge Instruction: Apply over primary dressing as directed. Secured With Compression Wrap ThreePress (3 layer compression wrap) Discharge Instruction: Apply three layer compression as directed. Compression Stockings Add-Ons Electronic Signature(s) Signed: 06/27/2023 12:28:15 PM By: Karie Schwalbe RN Entered By: Karie Schwalbe on 06/22/2023 10:23:45 -------------------------------------------------------------------------------- Wound Assessment Details Patient Name: Date of Service: [name], [name] 06/22/2023 1:00 PM Medical Record Number: [id number] Patient Account Number: [id number] Date of Birth/Sex: Treating RN: [birth year] (72 y.o. [name] Primary Care Shankar Silber: [name] Other Clinician: Referring Markitta Ausburn: Treating Carlton Buskey/Extender: Adele Dan in Treatment: 69 Wound Status Wound Number: 3 Primary Lymphedema Etiology: Wound Location: Right, Medial Lower Leg Wound Open Wounding Event: Gradually Appeared Status: Date Acquired: 12/07/2021 [name], GILCHREST ([id number]) [id number] Page 5 of 7 Date Acquired: 12/07/2021 Comorbid Cataracts, Anemia, Lymphedema, Congestive Heart Failure, Deep Weeks Of Treatment: 71 History: Vein Thrombosis, Hypertension, Peripheral Venous Disease Clustered Wound: No Wound Measurements Length: (cm) 3 Width: (  cm) 4 Depth: (cm) 0.2 Area: (cm) 9.425 Volume: (cm) 1.885 % Reduction in Area:  -76.5% % Reduction in Volume: 11.8% Epithelialization: Medium (34-66%) Tunneling: No Undermining: No Wound Description Classification: Full Thickness With Exposed Suppo Wound Margin: Flat and Intact Exudate Amount: Medium Exudate Type: Serous Exudate Color: amber rt Structures Foul Odor After Cleansing: No Slough/Fibrino Yes Wound Bed Granulation Amount: Small (1-33%) Exposed Structure Granulation Quality: Red Fascia Exposed: No Necrotic Amount: Large (67-100%) Fat Layer (Subcutaneous Tissue) Exposed: Yes Necrotic Quality: Eschar, Adherent Slough Tendon Exposed: No Muscle Exposed: No Joint Exposed: No Bone Exposed: No Periwound Skin Texture Texture Color No Abnormalities Noted: Yes No Abnormalities Noted: No Erythema: No Moisture Hemosiderin Staining: Yes No Abnormalities Noted: Yes Rubor: Yes Temperature / Pain Temperature: No Abnormality Treatment Notes Wound #3 (Lower Leg) Wound Laterality: Right, Medial Cleanser Soap and Water Discharge Instruction: May shower and wash wound with dial antibacterial soap and water prior to dressing change. Wound Cleanser Discharge Instruction: Cleanse the wound with wound cleanser prior to applying a clean dressing using gauze sponges, not tissue or cotton balls. Peri-Wound Care Skin Prep Discharge Instruction: cavelon to macerated wound margins Sween Lotion (Moisturizing lotion) Discharge Instruction: Apply moisturizing lotion as directed Topical Primary Dressing Maxorb Extra Ag+ Alginate Dressing, 4x4.75 (in/in) Discharge Instruction: Apply to excoriated weeping areas Secondary Dressing ABD Pad, 8x10 Discharge Instruction: Apply over primary dressing as directed. Secured With Compression Wrap ThreePress (3 layer compression wrap) Discharge Instruction: Apply three layer compression as directed. Unnaboot w/Calamine, 4x10 (in/yd) Discharge Instruction: Apply Unnaboot on distal foot and top of lower leg Compression  Stockings Add-Ons [name], [name] ([id number]) [id number] Page 6 of 7 Electronic Signature(s) Signed: 06/27/2023 12:28:15 PM By: Karie Schwalbe RN Entered By: Karie Schwalbe on 06/22/2023 10:24:15 -------------------------------------------------------------------------------- Wound Assessment Details Patient Name: Date of Service: [name], [name] 06/22/2023 1:00 PM Medical Record Number: [id number] Patient Account Number: [id number] Date of Birth/Sex: Treating RN: [birth year] (72 y.o. [name] Primary Care Dohn Stclair: [name] Other Clinician: Referring Adriene Knipfer: Treating Kash Mothershead/Extender: Adele Dan in Treatment: 60 Wound Status Wound Number: 6 Primary Calcinosis Etiology: Wound Location: Right, Anterior Lower Leg Wound Open Wounding Event: Gradually Appeared Status: Date Acquired: 05/12/2022 Comorbid Cataracts, Anemia, Lymphedema, Congestive Heart Failure, Deep Weeks Of Treatment: 58 History: Vein Thrombosis, Hypertension, Peripheral Venous Disease Clustered Wound: Yes Wound Measurements Length: (cm) Width: (cm) Depth: (cm) Clustered Quantity: Area: (cm) Volume: (cm) 0.2 % Reduction in Area: 60.2% 0.6 % Reduction in Volume: 20.8% 0.2 Epithelialization: Large (67-100%) 1 Tunneling: No 0.094 Undermining: No 0.019 Wound Description Classification: Full Thickness Without Exposed Sup Wound Margin: Epibole Exudate Amount: Small Exudate Type: Serous Exudate Color: amber port Structures Foul Odor After Cleansing: No Slough/Fibrino Yes Wound Bed Granulation Amount: Small (1-33%) Exposed Structure Granulation Quality: Pink Fascia Exposed: No Necrotic Amount: Large (67-100%) Fat Layer (Subcutaneous Tissue) Exposed: Yes Tendon Exposed: No Muscle Exposed: No Joint Exposed: No Bone Exposed: No Periwound Skin Texture Texture Color No Abnormalities Noted: Yes No Abnormalities Noted: No Atrophie Blanche:  No Moisture Cyanosis: No No Abnormalities Noted: Yes Ecchymosis: No Erythema: No Hemosiderin Staining: Yes Mottled: No Pallor: No Rubor: No Temperature / Pain Temperature: No Abnormality Treatment Notes Wound #6 (Lower Leg) Wound Laterality: Right, Anterior Cleanser Soap and Water [name], [name] ([id number]) [id number] Page 7 of 7 Discharge Instruction: May shower and wash wound with dial antibacterial soap and water prior to dressing change. Wound Cleanser Discharge Instruction: Cleanse the wound with wound cleanser prior to applying a clean  dressing using gauze sponges, not tissue or cotton balls. Peri-Wound Care Sween Lotion (Moisturizing lotion) Discharge Instruction: Apply moisturizing lotion as directed Topical Primary Dressing Maxorb Extra Ag+ Alginate Dressing, 2x2 (in/in) Discharge Instruction: Apply to wound bed as instructed Secondary Dressing ABD Pad, 8x10 Discharge Instruction: Apply over primary dressing as directed. Secured With Compression Wrap ThreePress (3 layer compression wrap) Discharge Instruction: Apply three layer compression as directed. Compression Stockings Add-Ons Electronic Signature(s) Signed: 06/27/2023 12:28:15 PM By: Karie Schwalbe RN Entered By: Karie Schwalbe on 06/22/2023 10:24:45 -------------------------------------------------------------------------------- Vitals Details Patient Name: Date of Service: [name] 06/22/2023 1:00 PM Medical Record Number: [id number] Patient Account Number: [id number] Date of Birth/Sex: Treating RN: 02/14/1951 (72 y.o. [name] Primary Care Odean Fester: [name] Other Clinician: Referring Florella Mcneese: Treating Cody Albus/Extender: Adele Dan in Treatment: 65 Vital Signs Time Taken: 13:05 Respiratory Rate (breaths/min): 18 Height (in): 71 Reference Range: 80 - 120 mg / dl Weight (lbs): 409 Body Mass Index (BMI): 37.2 Electronic  Signature(s) Signed: 06/27/2023 12:28:15 PM By: Karie Schwalbe RN Entered By: Karie Schwalbe on 06/22/2023 10:05:42

## 2023-06-28 LAB — BASIC METABOLIC PANEL
BUN/Creatinine Ratio: 25 — ABNORMAL HIGH (ref 10–24)
BUN: 26 mg/dL (ref 8–27)
CO2: 26 mmol/L (ref 20–29)
Calcium: 9.1 mg/dL (ref 8.6–10.2)
Chloride: 104 mmol/L (ref 96–106)
Creatinine, Ser: 1.06 mg/dL (ref 0.76–1.27)
Glucose: 88 mg/dL (ref 70–99)
Potassium: 5 mmol/L (ref 3.5–5.2)
Sodium: 143 mmol/L (ref 134–144)
eGFR: 74 mL/min/{1.73_m2} (ref >59)

## 2023-06-28 LAB — PRO B NATRIURETIC PEPTIDE: NT-Pro BNP: 522 pg/mL — ABNORMAL HIGH (ref 0–376)

## 2023-06-29 ENCOUNTER — Ambulatory Visit (HOSPITAL_COMMUNITY)
Admission: RE | Admit: 2023-06-29 | Discharge: 2023-06-29 | Disposition: A | Discharge status: Home / Self Care | Payer: Medicare Other | Source: Ambulatory Visit | Attending: Internal Medicine | Admitting: Internal Medicine

## 2023-06-29 DIAGNOSIS — I82499 Acute embolism and thrombosis of other specified deep vein of unspecified lower extremity: Secondary | ICD-10-CM | POA: Diagnosis not present

## 2023-06-29 DIAGNOSIS — R918 Other nonspecific abnormal finding of lung field: Secondary | ICD-10-CM | POA: Diagnosis not present

## 2023-06-29 DIAGNOSIS — Z86718 Personal history of other venous thrombosis and embolism: Secondary | ICD-10-CM | POA: Diagnosis not present

## 2023-06-29 DIAGNOSIS — R0602 Shortness of breath: Secondary | ICD-10-CM | POA: Insufficient documentation

## 2023-06-29 DIAGNOSIS — J8410 Pulmonary fibrosis, unspecified: Secondary | ICD-10-CM | POA: Diagnosis not present

## 2023-06-29 DIAGNOSIS — J984 Other disorders of lung: Secondary | ICD-10-CM | POA: Diagnosis not present

## 2023-06-30 ENCOUNTER — Encounter (HOSPITAL_BASED_OUTPATIENT_CLINIC_OR_DEPARTMENT_OTHER): Payer: Medicare Other | Attending: Internal Medicine | Admitting: Internal Medicine

## 2023-06-30 DIAGNOSIS — L97822 Non-pressure chronic ulcer of other part of left lower leg with fat layer exposed: Secondary | ICD-10-CM | POA: Diagnosis not present

## 2023-06-30 DIAGNOSIS — I890 Lymphedema, not elsewhere classified: Secondary | ICD-10-CM | POA: Insufficient documentation

## 2023-06-30 DIAGNOSIS — D649 Anemia, unspecified: Secondary | ICD-10-CM | POA: Insufficient documentation

## 2023-06-30 DIAGNOSIS — M793 Panniculitis, unspecified: Secondary | ICD-10-CM | POA: Diagnosis not present

## 2023-06-30 DIAGNOSIS — Z86718 Personal history of other venous thrombosis and embolism: Secondary | ICD-10-CM | POA: Diagnosis not present

## 2023-06-30 DIAGNOSIS — I739 Peripheral vascular disease, unspecified: Secondary | ICD-10-CM | POA: Insufficient documentation

## 2023-06-30 DIAGNOSIS — I872 Venous insufficiency (chronic) (peripheral): Secondary | ICD-10-CM | POA: Insufficient documentation

## 2023-06-30 DIAGNOSIS — L97812 Non-pressure chronic ulcer of other part of right lower leg with fat layer exposed: Secondary | ICD-10-CM | POA: Diagnosis not present

## 2023-06-30 DIAGNOSIS — I110 Hypertensive heart disease with heart failure: Secondary | ICD-10-CM | POA: Diagnosis not present

## 2023-06-30 DIAGNOSIS — L942 Calcinosis cutis: Secondary | ICD-10-CM | POA: Diagnosis not present

## 2023-06-30 DIAGNOSIS — I509 Heart failure, unspecified: Secondary | ICD-10-CM | POA: Diagnosis not present

## 2023-06-30 DIAGNOSIS — I2510 Atherosclerotic heart disease of native coronary artery without angina pectoris: Secondary | ICD-10-CM | POA: Diagnosis not present

## 2023-07-03 ENCOUNTER — Encounter: Payer: Self-pay | Admitting: Internal Medicine

## 2023-07-03 DIAGNOSIS — I82499 Acute embolism and thrombosis of other specified deep vein of unspecified lower extremity: Secondary | ICD-10-CM

## 2023-07-04 MED ORDER — APIXABAN 2.5 MG PO TABS
2.5000 mg | ORAL_TABLET | Freq: Two times a day (BID) | ORAL | 3 refills | Status: AC
  Filled 2024-01-29: qty 180, 90d supply, fill #0
  Filled 2024-04-25: qty 180, 90d supply, fill #1

## 2023-07-04 NOTE — Progress Notes (Signed)
WILLA, STOCKSTILL ([id number]) [id number] Page 1 of 6 Visit Report for 06/30/2023 Arrival Information Details Patient Name: Date of Service: [name], [name] 06/30/2023 1:30 PM Medical Record Number: [id number] Patient Account Number: [id number] Date of Birth/Sex: Treating RN: 09-04-1950 (72 y.o. Cline Cools Primary Care Naoma Boxell: [name] Other Clinician: Referring Drucella Karbowski: Treating Malak Orantes/Extender: Suzzette Righter in Treatment: 72 Visit Information History Since Last Visit Added or deleted any medications: No Patient Arrived: Wheel Chair Any new allergies or adverse reactions: No Arrival Time: 16:08 Had a fall or experienced change in No Accompanied By: self activities of daily living that may affect Transfer Assistance: None risk of falls: Patient Identification Verified: Yes Signs or symptoms of abuse/neglect since last visito No Secondary Verification Process Completed: Yes Hospitalized since last visit: No Patient Requires Transmission-Based Precautions: No Implantable device outside of the clinic excluding No Patient Has Alerts: Yes cellular tissue based products placed in the center Patient Alerts: R ABI= N/C, TBI= .75 since last visit: L ABI =N/C, TBI = .57 Has Dressing in Place as Prescribed: Yes Has Compression in Place as Prescribed: Yes Pain Present Now: No Electronic Signature(s) Signed: 07/04/2023 4:33:32 PM By: Redmond Pulling RN, BSN Entered By: Redmond Pulling on 06/30/2023 16:08:40 -------------------------------------------------------------------------------- Compression Therapy Details Patient Name: Date of Service: [name] 06/30/2023 1:30 PM Medical Record Number: [id number] Patient Account Number: [id number] Date of Birth/Sex: Treating RN: 12/02/1950 (72 y.o. Cline Cools Primary Care Kya Mayfield: [name] Other Clinician: Referring Jalisia Puchalski: Treating Aolanis Crispen/Extender: Suzzette Righter in Treatment: 72 Compression Therapy Performed for Wound Assessment: Wound #3 Right,Medial Lower Leg Performed By: Clinician Redmond Pulling, RN Compression Type: Four Layer Electronic Signature(s) Signed: 07/04/2023 4:33:32 PM By: Redmond Pulling RN, BSN Entered By: Redmond Pulling on 06/30/2023 16:09:39 -------------------------------------------------------------------------------- Compression Therapy Details Patient Name: Date of Service: [name] 06/30/2023 1:30 PM Medical Record Number: [id number] Patient Account Number: [id number] [name], [name] ([id number]) [id number] Page 2 of 6 Date of Birth/Sex: Treating RN: 12-25-1950 (72 y.o. Cline Cools Primary Care Sharmane Dame: Other Clinician: [name] Referring Kunio Cummiskey: Treating Jailen Lung/Extender: Suzzette Righter in Treatment: 72 Compression Therapy Performed for Wound Assessment: Wound #6 Right,Anterior Lower Leg Performed By: Clinician Redmond Pulling, RN Compression Type: Four Layer Electronic Signature(s) Signed: 07/04/2023 4:33:32 PM By: Redmond Pulling RN, BSN Entered By: Redmond Pulling on 06/30/2023 16:09:39 -------------------------------------------------------------------------------- Encounter Discharge Information Details Patient Name: Date of Service: [name], [name] 06/30/2023 1:30 PM Medical Record Number: [id number] Patient Account Number: [id number] Date of Birth/Sex: Treating RN: Jun 16, 1951 (72 y.o. Cline Cools Primary Care Christol Thetford: [name] Other Clinician: Referring Aaleigha Bozza: Treating Tannor Pyon/Extender: Suzzette Righter in Treatment: 72 Encounter Discharge Information Items Discharge Condition: Stable Ambulatory Status: Wheelchair Discharge Destination: Home Transportation: Private Auto Accompanied By: self Schedule Follow-up Appointment: Yes Clinical Summary of Care: Patient  Declined Electronic Signature(s) Signed: 07/04/2023 4:33:32 PM By: Redmond Pulling RN, BSN Entered By: Redmond Pulling on 06/30/2023 16:10:26 -------------------------------------------------------------------------------- Patient/Caregiver Education Details Patient Name: Date of Service: [name] 12/5/2024andnbsp1:30 PM Medical Record Number: [id number] Patient Account Number: [id number] Date of Birth/Gender: Treating RN: Dec 25, 1950 (72 y.o. Cline Cools Primary Care Physician: [name] Other Clinician: Referring Physician: Treating Physician/Extender: Suzzette Righter in Treatment: 12 Education Assessment Education Provided To: Patient Education Topics Provided Wound/Skin Impairment: Methods: Explain/Verbal Responses: State content correctly [employer]) [place], [name] ([id number]) [id number] Page 3 of 6 Signed: 07/04/2023 4:33:32 PM By: Redmond Pulling RN, BSN Entered By: Rubye Oaks,  Carrie on 06/30/2023 16:10:07 -------------------------------------------------------------------------------- Wound Assessment Details Patient Name: Date of Service: [name], [name] 06/30/2023 1:30 PM Medical Record Number: [id number] Patient Account Number: [id number] Date of Birth/Sex: Treating RN: July 28, 1950 (72 y.o. Cline Cools Primary Care Markey Deady: [name] Other Clinician: Referring Achsah Mcquade: Treating Dennison Mcdaid/Extender: Suzzette Righter in Treatment: 72 Wound Status Wound Number: 12 Primary Etiology: Lymphedema Wound Location: Left, Anterior Lower Leg Secondary Etiology: Venous Leg Ulcer Wounding Event: Gradually Appeared Wound Status: Open Date Acquired: 05/27/2023 Weeks Of Treatment: 2 Clustered Wound: No Wound Measurements Length: (cm) Width: (cm) Depth: (cm) Area: (cm) Volume: (cm) 0 % Reduction in Area: 100% 0 % Reduction in Volume: 100% 0 0 0 Wound  Description Classification: Full Thickness Without Exposed Support S Exudate Amount: None Present tructures Periwound Skin Texture Texture Color No Abnormalities Noted: No No Abnormalities Noted: No Moisture No Abnormalities Noted: No Treatment Notes Wound #12 (Lower Leg) Wound Laterality: Left, Anterior Cleanser Soap and Water Discharge Instruction: May shower and wash wound with dial antibacterial soap and water prior to dressing change. Wound Cleanser Discharge Instruction: Cleanse the wound with wound cleanser prior to applying a clean dressing using gauze sponges, not tissue or cotton balls. Peri-Wound Care Sween Lotion (Moisturizing lotion) Discharge Instruction: Apply moisturizing lotion as directed Topical Primary Dressing Maxorb Extra Ag+ Alginate Dressing, 2x2 (in/in) Discharge Instruction: Apply to wound bed as instructed Secondary Dressing ABD Pad, 8x10 Discharge Instruction: Apply over primary dressing as directed. Secured With Compression Wrap ThreePress (3 layer compression wrap) [name], [name] ([id number]) [id number] Page 4 of 6 Discharge Instruction: Apply three layer compression as directed. Compression Stockings Add-Ons Electronic Signature(s) Signed: 07/04/2023 4:33:32 PM By: Redmond Pulling RN, BSN Entered By: Redmond Pulling on 06/30/2023 16:09:02 -------------------------------------------------------------------------------- Wound Assessment Details Patient Name: Date of Service: [name], [name] 06/30/2023 1:30 PM Medical Record Number: [id number] Patient Account Number: [id number] Date of Birth/Sex: Treating RN: 10/07/1950 (72 y.o. Cline Cools Primary Care Elvie Palomo: [name] Other Clinician: Referring Endi Lagman: Treating Richele Strand/Extender: Suzzette Righter in Treatment: 72 Wound Status Wound Number: 3 Primary Etiology: Lymphedema Wound Location: Right, Medial Lower Leg Wound Status:  Open Wounding Event: Gradually Appeared Date Acquired: 12/07/2021 Weeks Of Treatment: 72 Clustered Wound: No Wound Measurements Length: (cm) 3 Width: (cm) 4 Depth: (cm) 0.2 Area: (cm) 9.425 Volume: (cm) 1.885 % Reduction in Area: -76.5% % Reduction in Volume: 11.8% Wound Description Classification: Full Thickness With Exposed Support Exudate Amount: Medium Exudate Type: Serous Exudate Color: amber Structures Periwound Skin Texture Texture Color No Abnormalities Noted: No No Abnormalities Noted: No Moisture No Abnormalities Noted: No Treatment Notes Wound #3 (Lower Leg) Wound Laterality: Right, Medial Cleanser Soap and Water Discharge Instruction: May shower and wash wound with dial antibacterial soap and water prior to dressing change. Wound Cleanser Discharge Instruction: Cleanse the wound with wound cleanser prior to applying a clean dressing using gauze sponges, not tissue or cotton balls. Peri-Wound Care Skin Prep Discharge Instruction: cavelon to macerated wound margins Sween Lotion (Moisturizing lotion) Discharge Instruction: Apply moisturizing lotion as directed Topical [name], [name] ([id number]) [id number] Page 5 of 6 Primary Dressing Maxorb Extra Ag+ Alginate Dressing, 4x4.75 (in/in) Discharge Instruction: Apply to excoriated weeping areas Secondary Dressing ABD Pad, 8x10 Discharge Instruction: Apply over primary dressing as directed. Secured With Compression Wrap ThreePress (3 layer compression wrap) Discharge Instruction: Apply three layer compression as directed. Unnaboot w/Calamine, 4x10 (in/yd) Discharge Instruction: Apply Unnaboot on distal foot and top of lower leg Compression Stockings Add-Ons Electronic Signature(s) Signed:  07/04/2023 4:33:32 PM By: Redmond Pulling RN, BSN Entered By: Redmond Pulling on 06/30/2023 16:09:02 -------------------------------------------------------------------------------- Wound Assessment  Details Patient Name: Date of Service: [name], [name] 06/30/2023 1:30 PM Medical Record Number: [id number] Patient Account Number: [id number] Date of Birth/Sex: Treating RN: 1951-01-03 (72 y.o. Cline Cools Primary Care Damarious Holtsclaw: [name] Other Clinician: Referring Jesenya Bowditch: Treating Zavior Thomason/Extender: Suzzette Righter in Treatment: 72 Wound Status Wound Number: 6 Primary Etiology: Calcinosis Wound Location: Right, Anterior Lower Leg Wound Status: Open Wounding Event: Gradually Appeared Date Acquired: 05/12/2022 Weeks Of Treatment: 59 Clustered Wound: Yes Wound Measurements Length: (cm) 0.2 Width: (cm) 0.6 Depth: (cm) 0.2 Area: (cm) 0.094 Volume: (cm) 0.019 % Reduction in Area: 60.2% % Reduction in Volume: 20.8% Wound Description Classification: Full Thickness Without Exposed Support Exudate Amount: Small Exudate Type: Serous Exudate Color: amber Structures Periwound Skin Texture Texture Color No Abnormalities Noted: No No Abnormalities Noted: No Moisture No Abnormalities Noted: No Treatment Notes Wound #6 (Lower Leg) Wound Laterality: Right, Anterior [name], [name] ([id number]) [id number] Page 6 of 6 Soap and Water Discharge Instruction: May shower and wash wound with dial antibacterial soap and water prior to dressing change. Wound Cleanser Discharge Instruction: Cleanse the wound with wound cleanser prior to applying a clean dressing using gauze sponges, not tissue or cotton balls. Peri-Wound Care Sween Lotion (Moisturizing lotion) Discharge Instruction: Apply moisturizing lotion as directed Topical Primary Dressing Maxorb Extra Ag+ Alginate Dressing, 2x2 (in/in) Discharge Instruction: Apply to wound bed as instructed Secondary Dressing ABD Pad, 8x10 Discharge Instruction: Apply over primary dressing as directed. Secured With Compression Wrap ThreePress (3 layer compression wrap) Discharge  Instruction: Apply three layer compression as directed. Compression Stockings Add-Ons Electronic Signature(s) Signed: 07/04/2023 4:33:32 PM By: Redmond Pulling RN, BSN Entered By: Redmond Pulling on 06/30/2023 16:09:02

## 2023-07-04 NOTE — Progress Notes (Signed)
[name], [name] ([id number]) [id number] Page 1 of 1 Visit Report for 06/30/2023 SuperBill Details Patient Name: Date of Service: DISHON, GLYMPH 06/30/2023 Medical Record Number: [id number] Patient Account Number: [id number] Date of Birth/Sex: Treating RN: 1951/01/11 (72 y.o. 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.0 Lymphedema, not elsewhere classified I10 Essential (primary) hypertension I25.10 Atherosclerotic heart disease of native coronary artery without angina pectoris I87.2 Venous insufficiency (chronic) (peripheral) Z86.718 Personal history of other venous thrombosis and embolism Facility Procedures CPT4 Code Description Modifier Quantity [phone] (Facility Use Only) [phone] - APPLY MULTLAY COMPRS LWR RT LEG 1 Electronic Signature(s) Signed: 06/30/2023 5:41:20 PM By: Baltazar Najjar MD Signed: 07/04/2023 4:33:32 PM By: Redmond Pulling RN, BSN Entered By: Redmond Pulling on 06/30/2023 16:10:40

## 2023-07-06 ENCOUNTER — Encounter (HOSPITAL_BASED_OUTPATIENT_CLINIC_OR_DEPARTMENT_OTHER): Payer: Medicare Other | Admitting: Internal Medicine

## 2023-07-06 DIAGNOSIS — I872 Venous insufficiency (chronic) (peripheral): Secondary | ICD-10-CM | POA: Diagnosis not present

## 2023-07-06 DIAGNOSIS — L97812 Non-pressure chronic ulcer of other part of right lower leg with fat layer exposed: Secondary | ICD-10-CM | POA: Diagnosis not present

## 2023-07-06 DIAGNOSIS — L97822 Non-pressure chronic ulcer of other part of left lower leg with fat layer exposed: Secondary | ICD-10-CM | POA: Diagnosis not present

## 2023-07-06 DIAGNOSIS — M793 Panniculitis, unspecified: Secondary | ICD-10-CM | POA: Diagnosis not present

## 2023-07-06 DIAGNOSIS — I739 Peripheral vascular disease, unspecified: Secondary | ICD-10-CM | POA: Diagnosis not present

## 2023-07-06 DIAGNOSIS — L942 Calcinosis cutis: Secondary | ICD-10-CM | POA: Diagnosis not present

## 2023-07-07 NOTE — Progress Notes (Signed)
EGYPT, CAMISA ([id number]) [id number] Page 1 of 6 Visit Report for 07/06/2023 Arrival Information Details Patient Name: Date of Service: [name], [name] 07/06/2023 10:00 A M Medical Record Number: [id number] Patient Account Number: [id number] Date of Birth/Sex: Treating RN: [birth year] (72 y.o. M) Primary Care Saturnino Liew: [name] Other Clinician: Referring Carols Clemence: Treating Ottis Sarnowski/Extender: Suzzette Righter in Treatment: 68 Visit Information History Since Last Visit Added or deleted any medications: No Patient Arrived: Wheel Chair Any new allergies or adverse reactions: No Arrival Time: 10:12 Had a fall or experienced change in No Accompanied By: self activities of daily living that may affect Transfer Assistance: None risk of falls: Patient Identification Verified: Yes Signs or symptoms of abuse/neglect since last visito No Secondary Verification Process Completed: Yes Hospitalized since last visit: No Patient Requires Transmission-Based Precautions: No Implantable device outside of the clinic excluding No Patient Has Alerts: Yes cellular tissue based products placed in the center Patient Alerts: R ABI= N/C, TBI= .75 since last visit: L ABI =N/C, TBI = .57 Has Dressing in Place as Prescribed: Yes Has Compression in Place as Prescribed: Yes Pain Present Now: No Electronic Signature(s) Signed: 07/06/2023 5:06:36 PM By: Thayer Dallas Entered By: Thayer Dallas on 07/06/2023 07:12:44 -------------------------------------------------------------------------------- Compression Therapy Details Patient Name: Date of Service: [name] 07/06/2023 10:00 A M Medical Record Number: [id number] Patient Account Number: [id number] Date of Birth/Sex: Treating RN: 1950-10-28 (72 y.o. M) Primary Care Veralyn Lopp: [name] Other Clinician: Referring Vahan Wadsworth: Treating Agapito Hanway/Extender: Suzzette Righter in  Treatment: 78 Compression Therapy Performed for Wound Assessment: Wound #6 Right,Anterior Lower Leg Performed By: Clinician Thayer Dallas, Electronic Signature(s) Signed: 07/06/2023 5:06:36 PM By: Thayer Dallas Entered By: Thayer Dallas on 07/06/2023 07:13:42 -------------------------------------------------------------------------------- Compression Therapy Details Patient Name: Date of Service: [name], [name] 07/06/2023 10:00 A M Medical Record Number: [id number] Patient Account Number: [id number] Date of Birth/Sex: Treating RN: 1951/07/26 (72 y.o. [place]ern [name], [name] ([id number]) [id number] Page 2 of 6 Primary Care Leilah Polimeni: [name] Other Clinician: Referring Dariane Natzke: Treating Jorje Vanatta/Extender: Suzzette Righter in Treatment: 18 Compression Therapy Performed for Wound Assessment: Wound #3 Right,Medial Lower Leg Performed By: Clinician Thayer Dallas, Compression Type: Double Layer Electronic Signature(s) Signed: 07/06/2023 5:06:36 PM By: Thayer Dallas Entered By: Thayer Dallas on 07/06/2023 07:13:53 -------------------------------------------------------------------------------- Encounter Discharge Information Details Patient Name: Date of Service: [name] 07/06/2023 10:00 A M Medical Record Number: [id number] Patient Account Number: [id number] Date of Birth/Sex: Treating RN: 30-Oct-1950 (72 y.o. M) Primary Care Jhalen Eley: [name] Other Clinician: Thayer Dallas Referring Tonnie Friedel: Treating Yina Riviere/Extender: Suzzette Righter in Treatment: 60 Encounter Discharge Information Items Discharge Condition: Stable Ambulatory Status: Wheelchair Discharge Destination: Home Transportation: Private Auto Accompanied By: self Schedule Follow-up Appointment: Yes Clinical Summary of Care: Electronic Signature(s) Signed: 07/06/2023 5:06:36 PM By: Thayer Dallas Entered By: Thayer Dallas on  07/06/2023 07:33:11 -------------------------------------------------------------------------------- Patient/Caregiver Education Details Patient Name: Date of Service: [name], [name] 12/11/2024andnbsp10:00 A M Medical Record Number: [id number] Patient Account Number: [id number] Date of Birth/Gender: Treating RN: February 12, 1951 (72 y.o. M) Primary Care Physician: [name] Other Clinician: Thayer Dallas Referring Physician: Treating Physician/Extender: Suzzette Righter in Treatment: 69 Education Assessment Education Provided To: Patient Education Topics Provided Electronic Signature(s) Signed: 07/06/2023 5:06:36 PM By: Thayer Dallas Entered By: Thayer Dallas on 07/06/2023 07:32:52 [name] ([id number]) [id number] Page 3 of 6 -------------------------------------------------------------------------------- Wound Assessment Details Patient Name: Date of Service: [name], [name] 07/06/2023 10:00 A M Medical Record Number: [id number] Patient Account Number:  [id number] Date of Birth/Sex: Treating RN: October 08, 1950 (72 y.o. M) Primary Care Alaijah Gibler: [name] Other Clinician: Referring Elayna Tobler: Treating Eartha Vonbehren/Extender: Suzzette Righter in Treatment: 73 Wound Status Wound Number: 12 Primary Etiology: Lymphedema Wound Location: Left, Anterior Lower Leg Secondary Etiology: Venous Leg Ulcer Wounding Event: Gradually Appeared Wound Status: Open Date Acquired: 05/27/2023 Weeks Of Treatment: 3 Clustered Wound: No Wound Measurements Length: (cm) Width: (cm) Depth: (cm) Area: (cm) Volume: (cm) 0 % Reduction in Area: 100% 0 % Reduction in Volume: 100% 0 0 0 Wound Description Classification: Full Thickness Without Exposed Support S Exudate Amount: None Present tructures Periwound Skin Texture Texture Color No Abnormalities Noted: No No Abnormalities Noted: No Moisture No Abnormalities Noted:  No Treatment Notes Wound #12 (Lower Leg) Wound Laterality: Left, Anterior Cleanser Soap and Water Discharge Instruction: May shower and wash wound with dial antibacterial soap and water prior to dressing change. Wound Cleanser Discharge Instruction: Cleanse the wound with wound cleanser prior to applying a clean dressing using gauze sponges, not tissue or cotton balls. Peri-Wound Care Sween Lotion (Moisturizing lotion) Discharge Instruction: Apply moisturizing lotion as directed Topical Primary Dressing Maxorb Extra Ag+ Alginate Dressing, 2x2 (in/in) Discharge Instruction: Apply to wound bed as instructed Secondary Dressing ABD Pad, 8x10 Discharge Instruction: Apply over primary dressing as directed. Secured With Compression Wrap ThreePress (3 layer compression wrap) Discharge Instruction: Apply three layer compression as directed. Compression Stockings Add-Ons LEQUAN, VILLARROEL ([id number]) [id number] Page 4 of 6 Electronic Signature(s) Signed: 07/06/2023 5:06:36 PM By: Thayer Dallas Entered By: Thayer Dallas on 07/06/2023 07:13:09 -------------------------------------------------------------------------------- Wound Assessment Details Patient Name: Date of Service: [name], [name] 07/06/2023 10:00 A M Medical Record Number: [id number] Patient Account Number: [id number] Date of Birth/Sex: Treating RN: 1950-10-12 (72 y.o. M) Primary Care Sayde Lish: [name] Other Clinician: Referring Countess Biebel: Treating Bernarr Longsworth/Extender: Suzzette Righter in Treatment: 73 Wound Status Wound Number: 3 Primary Etiology: Lymphedema Wound Location: Right, Medial Lower Leg Wound Status: Open Wounding Event: Gradually Appeared Date Acquired: 12/07/2021 Weeks Of Treatment: 73 Clustered Wound: No Wound Measurements Length: (cm) 3 Width: (cm) 4 Depth: (cm) 0.2 Area: (cm) 9.425 Volume: (cm) 1.885 % Reduction in Area: -76.5% % Reduction in  Volume: 11.8% Wound Description Classification: Full Thickness With Exposed Support Exudate Amount: Medium Exudate Type: Serous Exudate Color: amber Structures Periwound Skin Texture Texture Color No Abnormalities Noted: No No Abnormalities Noted: No Moisture No Abnormalities Noted: No Treatment Notes Wound #3 (Lower Leg) Wound Laterality: Right, Medial Cleanser Soap and Water Discharge Instruction: May shower and wash wound with dial antibacterial soap and water prior to dressing change. Wound Cleanser Discharge Instruction: Cleanse the wound with wound cleanser prior to applying a clean dressing using gauze sponges, not tissue or cotton balls. Peri-Wound Care Skin Prep Discharge Instruction: cavelon to macerated wound margins Sween Lotion (Moisturizing lotion) Discharge Instruction: Apply moisturizing lotion as directed Topical Primary Dressing Maxorb Extra Ag+ Alginate Dressing, 4x4.75 (in/in) Discharge Instruction: Apply to excoriated weeping areas Secondary Dressing DANNIAL, DRENNEN ([id number]) [id number] Page 5 of 6 ABD Pad, 8x10 Discharge Instruction: Apply over primary dressing as directed. Secured With Compression Wrap ThreePress (3 layer compression wrap) Discharge Instruction: Apply three layer compression as directed. Unnaboot w/Calamine, 4x10 (in/yd) Discharge Instruction: Apply Unnaboot on distal foot and top of lower leg Compression Stockings Add-Ons Electronic Signature(s) Signed: 07/06/2023 5:06:36 PM By: Thayer Dallas Entered By: Thayer Dallas on 07/06/2023 07:13:09 -------------------------------------------------------------------------------- Wound Assessment Details Patient Name: Date of Service: [name], [name] 07/06/2023 10:00 A M Medical Record  Number: [id number] Patient Account Number: [id number] Date of Birth/Sex: Treating RN: October 24, 1950 (72 y.o. M) Primary Care Shoaib Siefker: [name] Other Clinician: Referring  Sondra Blixt: Treating Dawnette Mione/Extender: Suzzette Righter in Treatment: 73 Wound Status Wound Number: 6 Primary Etiology: Calcinosis Wound Location: Right, Anterior Lower Leg Wound Status: Open Wounding Event: Gradually Appeared Date Acquired: 05/12/2022 Weeks Of Treatment: 60 Clustered Wound: Yes Wound Measurements Length: (cm) 0.2 Width: (cm) 0.6 Depth: (cm) 0.2 Area: (cm) 0.094 Volume: (cm) 0.019 % Reduction in Area: 60.2% % Reduction in Volume: 20.8% Wound Description Classification: Full Thickness Without Exposed Support Exudate Amount: Small Exudate Type: Serous Exudate Color: amber Structures Periwound Skin Texture Texture Color No Abnormalities Noted: No No Abnormalities Noted: No Moisture No Abnormalities Noted: No Treatment Notes Wound #6 (Lower Leg) Wound Laterality: Right, Anterior Cleanser Soap and Water Discharge Instruction: May shower and wash wound with dial antibacterial soap and water prior to dressing change. Wound Cleanser Discharge Instruction: Cleanse the wound with wound cleanser prior to applying a clean dressing using gauze sponges, not tissue or cotton balls. [name], [name] ([id number]) [id number] Page 6 of 6 Peri-Wound Care Sween Lotion (Moisturizing lotion) Discharge Instruction: Apply moisturizing lotion as directed Topical Primary Dressing Maxorb Extra Ag+ Alginate Dressing, 2x2 (in/in) Discharge Instruction: Apply to wound bed as instructed Secondary Dressing ABD Pad, 8x10 Discharge Instruction: Apply over primary dressing as directed. Secured With Compression Wrap ThreePress (3 layer compression wrap) Discharge Instruction: Apply three layer compression as directed. Compression Stockings Add-Ons Electronic Signature(s) Signed: 07/06/2023 5:06:36 PM By: Thayer Dallas Entered By: Thayer Dallas on 07/06/2023  07:13:09 -------------------------------------------------------------------------------- Vitals Details Patient Name: Date of Service: [name] 07/06/2023 10:00 A M Medical Record Number: [id number] Patient Account Number: [id number] Date of Birth/Sex: Treating RN: 13-Oct-1950 (72 y.o. M) Primary Care Tecumseh Yeagley: [name] Other Clinician: Referring Dorr Perrot: Treating Rosie Torrez/Extender: Suzzette Righter in Treatment: 12 Vital Signs Time Taken: 10:12 Reference Range: 80 - 120 mg / dl Height (in): 71 Weight (lbs): 267 Body Mass Index (BMI): 37.2 Electronic Signature(s) Signed: 07/06/2023 5:06:36 PM By: Thayer Dallas Entered By: Thayer Dallas on 07/06/2023 07:12:51

## 2023-07-07 NOTE — Progress Notes (Signed)
TOSHIAKI, TISBY ([id number]) [id number] Page 1 of 1 Visit Report for 07/06/2023 SuperBill Details Patient Name: Date of Service: [name], [name] 07/06/2023 Medical Record Number: [id number] Patient Account Number: [id number] Date of Birth/Sex: Treating RN: Nov 16, 1950 (72 y.o. 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

## 2023-07-15 ENCOUNTER — Encounter (HOSPITAL_BASED_OUTPATIENT_CLINIC_OR_DEPARTMENT_OTHER): Payer: Medicare Other | Admitting: General Surgery

## 2023-07-15 DIAGNOSIS — M793 Panniculitis, unspecified: Secondary | ICD-10-CM | POA: Diagnosis not present

## 2023-07-15 DIAGNOSIS — L942 Calcinosis cutis: Secondary | ICD-10-CM | POA: Diagnosis not present

## 2023-07-15 DIAGNOSIS — L97822 Non-pressure chronic ulcer of other part of left lower leg with fat layer exposed: Secondary | ICD-10-CM | POA: Diagnosis not present

## 2023-07-15 DIAGNOSIS — I890 Lymphedema, not elsewhere classified: Secondary | ICD-10-CM | POA: Diagnosis not present

## 2023-07-15 DIAGNOSIS — I739 Peripheral vascular disease, unspecified: Secondary | ICD-10-CM | POA: Diagnosis not present

## 2023-07-15 DIAGNOSIS — L97812 Non-pressure chronic ulcer of other part of right lower leg with fat layer exposed: Secondary | ICD-10-CM | POA: Diagnosis not present

## 2023-07-15 DIAGNOSIS — I872 Venous insufficiency (chronic) (peripheral): Secondary | ICD-10-CM | POA: Diagnosis not present

## 2023-07-16 NOTE — Progress Notes (Signed)
[name], [name] ([id number]) [id number] Page 1 of 9 Visit Report for 07/15/2023 Arrival Information Details Patient Name: Date of Service: [name], [name] 07/15/2023 8:30 A M Medical Record Number: [id number] Patient Account Number: [id number] Date of Birth/Sex: Treating RN: 04-14-1951 (72 y.o. M) Primary Care Humzah Harty: [name] Other Clinician: Referring Kayzen Kendzierski: Treating Tivis Wherry/Extender: [name] in Treatment: 73 Visit Information History Since Last Visit Added or deleted any medications: No Patient Arrived: Wheel Chair Any new allergies or adverse reactions: No Arrival Time: 08:24 Had a fall or experienced change in No Accompanied By: self activities of daily living that may affect Transfer Assistance: None risk of falls: Patient Identification Verified: Yes Signs or symptoms of abuse/neglect since last visito No Secondary Verification Process Completed: Yes Hospitalized since last visit: No Patient Requires Transmission-Based Precautions: No Implantable device outside of the clinic excluding No Patient Has Alerts: Yes cellular tissue based products placed in the center Patient Alerts: R ABI= N/C, TBI= .75 since last visit: L ABI =N/C, TBI = .57 Pain Present Now: No Electronic Signature(s) Signed: 07/15/2023 8:49:45 AM By: Dayton Scrape Entered By: Dayton Scrape on 07/15/2023 05:25:00 -------------------------------------------------------------------------------- Compression Therapy Details Patient Name: Date of Service: [name], [name] 07/15/2023 8:30 A M Medical Record Number: [id number] Patient Account Number: [id number] Date of Birth/Sex: Treating RN: Mar 08, 1951 (72 y.o. [name] Primary Care Francisco Eyerly: [name] Other Clinician: Referring Riti Rollyson: Treating Margurette Brener/Extender: [name] in Treatment: 41 Compression Therapy Performed for Wound Assessment: Wound #3  Right,Medial Lower Leg Performed By: Clinician Zenaida Deed, RN Compression Type: Three Layer Post Procedure Diagnosis Same as Pre-procedure Electronic Signature(s) Signed: 07/15/2023 12:52:17 PM By: Zenaida Deed RN, BSN Entered By: Zenaida Deed on 07/15/2023 06:01:07 Lower Extremity Assessment Details -------------------------------------------------------------------------------- [name] ([id number]) [id number] Page 2 of 9 Patient Name: Date of Service: [name], [name] 07/15/2023 8:30 A M Medical Record Number: [id number] Patient Account Number: [id number] Date of Birth/Sex: Treating RN: 1951-03-19 (72 y.o. Tammy Sours Primary Care Merritt Mccravy: [name] Other Clinician: Referring Hilmer Aliberti: Treating Herminio Kniskern/Extender: [name] in Treatment: 74 Edema Assessment Left: Right: Assessed: No Yes Edema: Yes Yes Calf Left: Right: Point of Measurement: From Medial Instep 44.5 cm Ankle Left: Right: Point of Measurement: From Medial Instep 26 cm Vascular Assessment Left: Right: Pulses: Dorsalis Pedis Palpable: Yes Extremity colors, hair growth, and conditions: Extremity Color: Hyperpigmented Hyperpigmented Hair Growth on Extremity: Yes Yes Temperature of Extremity: Warm Warm Capillary Refill: < 3 seconds < 3 seconds Dependent Rubor: No No Blanched when Elevated: No Lipodermatosclerosis: Yes Yes Toe Nail Assessment Left: Right: Thick: Yes Discolored: Yes Deformed: Yes Improper Length and Hygiene: No Electronic Signature(s) Signed: 07/15/2023 11:46:20 AM By: Shawn Stall RN, BSN Entered By: Shawn Stall on 07/15/2023 05:39:42 -------------------------------------------------------------------------------- Multi Wound Chart Details Patient Name: Date of Service: [name] 07/15/2023 8:30 A M Medical Record Number: [id number] Patient Account Number: [id number] Date of Birth/Sex: Treating  RN: Jan 01, 1951 (72 y.o. M) Primary Care Keoshia Steinmetz: [name] Other Clinician: Referring Roberto Romanoski: Treating Shelton Square/Extender: [name] in Treatment: 25 Vital Signs Height(in): 71 Pulse(bpm): 60 Weight(lbs): 267 Blood Pressure(mmHg): 133/49 Body Mass Index(BMI): 37.2 Temperature(F): 97.5 Respiratory Rate(breaths/min): 18 [12:Photos: No Photos] [[id number] Page 3 of 9] Left, Anterior Lower Leg Right, Medial Lower Leg Right, Anterior Lower Leg Wound Location: Gradually Appeared Gradually Appeared Gradually Appeared Wounding Event: Lymphedema Lymphedema Calcinosis Primary Etiology: Venous Leg Ulcer N/A N/A Secondary Etiology: N/A Cataracts, Anemia, Lymphedema, Cataracts, Anemia, Lymphedema, Comorbid History:  Congestive Heart Failure, Deep Vein Congestive Heart Failure, Deep Vein Thrombosis, Hypertension, Peripheral Thrombosis, Hypertension, Peripheral Venous Disease Venous Disease 05/27/2023 12/07/2021 05/12/2022 Date Acquired: 5 72 61 Weeks of Treatment: Healed - Epithelialized Open Open Wound Status: No No No Wound Recurrence: No No Yes Clustered Wound: 0x0x0 0.9x3.5x0.1 0.2x0.2x0.2 Measurements L x W x D (cm) 0 2.474 0.031 A (cm) : rea 0 0.247 0.006 Volume (cm) : 100.00% 53.70% 86.90% % Reduction in Area: 100.00% 88.40% 75.00% % Reduction in Volume: Full Thickness Without Exposed Full Thickness With Exposed Support Full Thickness Without Exposed Classification: Support Structures Structures Support Structures None Present Medium Small Exudate A mount: N/A Serous Serous Exudate Type: N/A amber amber Exudate Color: N/A Distinct, outline attached Distinct, outline attached Wound Margin: N/A Large (67-100%) Large (67-100%) Granulation A mount: N/A Red Red, Pink Granulation Quality: N/A Small (1-33%) Small (1-33%) Necrotic A mount: N/A Large (67-100%) Small (1-33%) Epithelialization: N/A  Debridement - Excisional Debridement - Excisional Debridement: Pre-procedure Verification/Time Out N/A 09:00 09:00 Taken: N/A Lidocaine 4% Topical Solution Lidocaine 4% Topical Solution Pain Control: N/A Subcutaneous, Slough Subcutaneous, Slough Tissue Debrided: N/A Skin/Subcutaneous Tissue Skin/Subcutaneous Tissue Level: N/A 1.24 0.03 Debridement A (sq cm): rea N/A Curette Curette Instrument: N/A Minimum Minimum Bleeding: N/A Pressure Pressure Hemostasis A chieved: N/A 0 0 Procedural Pain: N/A 0 0 Post Procedural Pain: N/A Procedure was tolerated well Procedure was tolerated well Debridement Treatment Response: N/A 0.9x3.5x0.1 0.2x0.2x0.2 Post Debridement Measurements L x W x D (cm) N/A 0.247 0.006 Post Debridement Volume: (cm) No Abnormalities Noted Excoriation: No Excoriation: No Periwound Skin Texture: Induration: No Induration: No Callus: No Callus: No Crepitus: No Crepitus: No Rash: No Rash: No Scarring: No Scarring: No No Abnormalities Noted Maceration: No Maceration: No Periwound Skin Moisture: Dry/Scaly: No Dry/Scaly: No No Abnormalities Noted Hemosiderin Staining: Yes Hemosiderin Staining: Yes Periwound Skin Color: Atrophie Blanche: No Atrophie Blanche: No Cyanosis: No Cyanosis: No Ecchymosis: No Ecchymosis: No Erythema: No Erythema: No Mottled: No Mottled: No Pallor: No Pallor: No Rubor: No Rubor: No N/A No Abnormality No Abnormality Temperature: N/A Compression Therapy Debridement Procedures Performed: Debridement Treatment Notes Electronic Signature(s) Signed: 07/15/2023 10:26:47 AM By: Duanne Guess MD FACS Entered By: Duanne Guess on 07/15/2023 16:10:96 [name] ([id number]) [id number] Page 4 of 9 -------------------------------------------------------------------------------- Multi-Disciplinary Care Plan Details Patient Name: Date of Service: [name], [name] 07/15/2023 8:30 A M Medical  Record Number: [id number] Patient Account Number: [id number] Date of Birth/Sex: Treating RN: 1951/01/14 (72 y.o. [name] Primary Care Tiyana Galla: [name] Other Clinician: Referring Alfreida Steffenhagen: Treating Lieutenant Abarca/Extender: [name] in Treatment: 14 Multidisciplinary Care Plan reviewed with physician Active Inactive Venous Leg Ulcer Nursing Diagnoses: Knowledge deficit related to disease process and management Potential for venous Insuffiency (use before diagnosis confirmed) Goals: Patient will maintain optimal edema control Date Initiated: 02/17/2022 Target Resolution Date: 07/22/2023 Goal Status: Active Interventions: Assess peripheral edema status every visit. Compression as ordered Provide education on venous insufficiency Treatment Activities: Non-invasive vascular studies : 02/17/2022 Therapeutic compression applied : 02/17/2022 Notes: Wound/Skin Impairment Nursing Diagnoses: Impaired tissue integrity Knowledge deficit related to ulceration/compromised skin integrity Goals: Patient/caregiver will verbalize understanding of skin care regimen Date Initiated: 02/17/2022 Target Resolution Date: 07/22/2023 Goal Status: Active Ulcer/skin breakdown will have a volume reduction of 30% by week 4 Date Initiated: 02/17/2022 Date Inactivated: 03/10/2022 Target Resolution Date: 03/10/2022 Unmet Reason: infection being treated, Goal Status: Unmet waiting on derm appte Ulcer/skin breakdown will have a volume reduction of 50% by week 8 Date Initiated:  03/10/2022 Date Inactivated: 04/14/2022 Target Resolution Date: 04/07/2022 Goal Status: Unmet Unmet Reason: calcinosis Ulcer/skin breakdown will have a volume reduction of 80% by week 12 Date Initiated: 04/14/2022 Date Inactivated: 05/05/2022 Target Resolution Date: 05/05/2022 Unmet Reason: infection, chronic Goal Status: Unmet condition Interventions: Assess patient/caregiver ability to obtain  necessary supplies Assess patient/caregiver ability to perform ulcer/skin care regimen upon admission and as needed Assess ulceration(s) every visit Provide education on ulcer and skin care Treatment Activities: Skin care regimen initiated : 02/17/2022 Topical wound management initiated : 02/17/2022 Notes: Electronic Signature(s) Signed: 07/15/2023 12:52:17 PM By: Zenaida Deed RN, BSN [place], [name] ([id number]) [id number] Page 5 of 9 Signed: 07/15/2023 12:52:17 PM By: Zenaida Deed RN, BSN Entered By: Zenaida Deed on 07/15/2023 05:39:19 -------------------------------------------------------------------------------- Pain Assessment Details Patient Name: Date of Service: [name], [name] 07/15/2023 8:30 A M Medical Record Number: [id number] Patient Account Number: [id number] Date of Birth/Sex: Treating RN: 09/30/1950 (71 y.o. M) Primary Care Laurie Penado: [name] Other Clinician: Referring Lorana Maffeo: Treating Zaxton Angerer/Extender: [name] in Treatment: 30 Active Problems Location of Pain Severity and Description of Pain Patient Has Paino No Site Locations Pain Management and Medication Current Pain Management: Electronic Signature(s) Signed: 07/15/2023 8:49:45 AM By: Dayton Scrape Entered By: Dayton Scrape on 07/15/2023 05:25:25 -------------------------------------------------------------------------------- Patient/Caregiver Education Details Patient Name: Date of Service: [name], [name] 12/20/2024andnbsp8:30 A M Medical Record Number: [id number] Patient Account Number: [id number] Date of Birth/Gender: Treating RN: [birth year] (72 y.o. [name] Primary Care Physician: [name] Other Clinician: Referring Physician: Treating Physician/Extender: [name] in Treatment: 62 Education Assessment Education Provided To: Patient Education Topics Provided NAJAE, LAUTEN ([id number])  [id number] Page 6 of 9 Infection: Methods: Explain/Verbal Responses: Reinforcements needed, State content correctly Venous: Methods: Explain/Verbal Responses: Reinforcements needed, State content correctly Wound/Skin Impairment: Methods: Explain/Verbal Responses: Reinforcements needed, State content correctly Electronic Signature(s) Signed: 07/15/2023 12:52:17 PM By: Zenaida Deed RN, BSN Entered By: Zenaida Deed on 07/15/2023 05:39:52 -------------------------------------------------------------------------------- Wound Assessment Details Patient Name: Date of Service: [name] 07/15/2023 8:30 A M Medical Record Number: [id number] Patient Account Number: [id number] Date of Birth/Sex: Treating RN: [birth year] (72 y.o. Tammy Sours Primary Care Alvena Kiernan: [name] Other Clinician: Referring Tinzlee Craker: Treating Vernetta Dizdarevic/Extender: [name] in Treatment: 74 Wound Status Wound Number: 12 Primary Etiology: Lymphedema Wound Location: Left, Anterior Lower Leg Secondary Etiology: Venous Leg Ulcer Wounding Event: Gradually Appeared Wound Status: Healed - Epithelialized Date Acquired: 05/27/2023 Weeks Of Treatment: 5 Clustered Wound: No Wound Measurements Length: (cm) Width: (cm) Depth: (cm) Area: (cm) Volume: (cm) 0 % Reduction in Area: 100% 0 % Reduction in Volume: 100% 0 0 0 Wound Description Classification: Full Thickness Without Exposed Support Exudate Amount: None Present Structures Periwound Skin Texture Texture Color No Abnormalities Noted: No No Abnormalities Noted: No Moisture No Abnormalities Noted: No Electronic Signature(s) Signed: 07/15/2023 11:46:20 AM By: Shawn Stall RN, BSN Entered By: Shawn Stall on 07/15/2023 05:39:53 Wound Assessment Details -------------------------------------------------------------------------------- [name] ([id number])  [id number] Page 7 of 9 Patient Name: Date of Service: [name], [name] 07/15/2023 8:30 A M Medical Record Number: [id number] Patient Account Number: [id number] Date of Birth/Sex: Treating RN: [birth year] (72 y.o. M) Primary Care Remedios Mckone: [name] Other Clinician: Referring Yunior Jain: Treating Breindel Collier/Extender: [name] in Treatment: 74 Wound Status Wound Number: 3 Primary Lymphedema Etiology: Wound Location: Right, Medial Lower Leg Wound Open Wounding Event: Gradually Appeared Status: Date Acquired: 12/07/2021 Comorbid Cataracts, Anemia, Lymphedema, Congestive Heart Failure, Deep Weeks Of Treatment: 74  History: Vein Thrombosis, Hypertension, Peripheral Venous Disease Clustered Wound: No Photos Wound Measurements Length: (cm) 0.9 % Reduction in Area: 53.7% Width: (cm) 3.5 % Reduction in Volume: 88.4% Depth: (cm) 0.1 Epithelialization: Large (67-100%) Area: (cm) 2.474 Tunneling: No Volume: (cm) 0.247 Undermining: No Wound Description Classification: Full Thickness With Exposed Support Structures Foul Odor After Cleansing: No Wound Margin: Distinct, outline attached Slough/Fibrino Yes Exudate Amount: Medium Exudate Type: Serous Exudate Color: amber Wound Bed Granulation Amount: Large (67-100%) Exposed Structure Granulation Quality: Red Fascia Exposed: No Necrotic Amount: Small (1-33%) Fat Layer (Subcutaneous Tissue) Exposed: Yes Necrotic Quality: Adherent Slough Tendon Exposed: No Muscle Exposed: No Joint Exposed: No Bone Exposed: No Periwound Skin Texture Texture Color No Abnormalities Noted: No No Abnormalities Noted: No Callus: No Atrophie Blanche: No Crepitus: No Cyanosis: No Excoriation: No Ecchymosis: No Induration: No Erythema: No Rash: No Hemosiderin Staining: Yes Scarring: No Mottled: No Pallor: No Moisture Rubor: No No Abnormalities Noted: No Dry / Scaly: No Temperature /  Pain Maceration: No Temperature: No Abnormality Electronic Signature(s) Signed: 07/15/2023 11:46:20 AM By: Shawn Stall RN, BSN Entered By: Shawn Stall on 07/15/2023 05:40:20 [name] ([id number]) [id number] Page 8 of 9 -------------------------------------------------------------------------------- Wound Assessment Details Patient Name: Date of Service: [name], [name] 07/15/2023 8:30 A M Medical Record Number: [id number] Patient Account Number: [id number] Date of Birth/Sex: Treating RN: 03/26/1951 (72 y.o. M) Primary Care Yared Barefoot: [name] Other Clinician: Referring Chamia Schmutz: Treating Roselynne Lortz/Extender: [name] in Treatment: 74 Wound Status Wound Number: 6 Primary Calcinosis Etiology: Wound Location: Right, Anterior Lower Leg Wound Open Wounding Event: Gradually Appeared Status: Date Acquired: 05/12/2022 Comorbid Cataracts, Anemia, Lymphedema, Congestive Heart Failure, Deep Weeks Of Treatment: 61 History: Vein Thrombosis, Hypertension, Peripheral Venous Disease Clustered Wound: Yes Photos Wound Measurements Length: (cm) 0 Width: (cm) 0 Depth: (cm) 0 Area: (cm) Volume: (cm) .2 % Reduction in Area: 86.9% .2 % Reduction in Volume: 75% .2 Epithelialization: Small (1-33%) 0.031 Tunneling: No 0.006 Undermining: No Wound Description Classification: Full Thickness Without Exposed Suppor Wound Margin: Distinct, outline attached Exudate Amount: Small Exudate Type: Serous Exudate Color: amber t Structures Wound Bed Granulation Amount: Large (67-100%) Exposed Structure Granulation Quality: Red, Pink Fascia Exposed: No Necrotic Amount: Small (1-33%) Fat Layer (Subcutaneous Tissue) Exposed: Yes Necrotic Quality: Adherent Slough Tendon Exposed: No Muscle Exposed: No Joint Exposed: No Bone Exposed: No Periwound Skin Texture Texture Color No Abnormalities Noted: No No Abnormalities Noted: No Callus:  No Atrophie Blanche: No Crepitus: No Cyanosis: No Excoriation: No Ecchymosis: No Induration: No Erythema: No Rash: No Hemosiderin Staining: Yes Scarring: No Mottled: No Pallor: No Moisture Rubor: No No Abnormalities Noted: No Dry / Scaly: No Temperature / Pain Maceration: No Temperature: No Abnormality Vanderploeg, Nicco ([id number]) [id number] Page 9 of 9 Electronic Signature(s) Signed: 07/15/2023 11:46:20 AM By: Shawn Stall RN, BSN Entered By: Shawn Stall on 07/15/2023 05:40:46 -------------------------------------------------------------------------------- Vitals Details Patient Name: Date of Service: [name] 07/15/2023 8:30 A M Medical Record Number: [id number] Patient Account Number: [id number] Date of Birth/Sex: Treating RN: 04-29-1951 (72 y.o. M) Primary Care Dawana Asper: [name] Other Clinician: Referring Jahlon Baines: Treating Jennings Stirling/Extender: [name] in Treatment: 100 Vital Signs Time Taken: 08:25 Temperature (F): 97.5 Height (in): 71 Pulse (bpm): 60 Weight (lbs): 267 Respiratory Rate (breaths/min): 18 Body Mass Index (BMI): 37.2 Blood Pressure (mmHg): 133/49 Reference Range: 80 - 120 mg / dl Electronic Signature(s) Signed: 07/15/2023 8:49:45 AM By: Dayton Scrape Entered By: Dayton Scrape on 07/15/2023 05:25:19

## 2023-07-16 NOTE — Progress Notes (Signed)
BLAZE, WOLOSZYK ([id number]) [id number] Page 1 of 15 Visit Report for 07/15/2023 Chief Complaint Document Details Patient Name: Date of Service: [name], [name] 07/15/2023 8:30 A M Medical Record Number: [id number] Patient Account Number: [id number] Date of Birth/Sex: Treating RN: 1950-08-05 (72 y.o. M) Primary Care Provider: Nicoletta [name] Other Clinician: Referring Provider: Treating Provider/Extender: [name] in Treatment: 54 Information Obtained from: Patient Chief Complaint Patient seen for complaints of Non-Healing Wounds. Electronic Signature(s) Signed: 07/15/2023 10:26:47 AM By: [name] Guess MD FACS Entered By: [name] on 07/15/2023 06:08:37 -------------------------------------------------------------------------------- Debridement Details Patient Name: Date of Service: [name] 07/15/2023 8:30 A M Medical Record Number: [id number] Patient Account Number: [id number] Date of Birth/Sex: Treating RN: [birth year] (72 y.o. [name] Primary Care Provider: Nicoletta [name] Other Clinician: Referring Provider: Treating Provider/Extender: [name] in Treatment: 74 Debridement Performed for Assessment: Wound #3 Right,Medial Lower Leg Performed By: Physician [name] Guess, MD The following information was scribed by: Zenaida Deed The information was scribed for: [name] Debridement Type: Debridement Level of Consciousness (Pre-procedure): Awake and Alert Pre-procedure Verification/Time Out Yes - 09:00 Taken: Start Time: 09:00 Pain Control: Lidocaine 4% T opical Solution Percent of Wound Bed Debrided: 50% T Area Debrided (cm): otal 1.24 Tissue and other material debrided: Viable, Non-Viable, Slough, Subcutaneous, Slough Level: Skin/Subcutaneous Tissue Debridement Description: Excisional Instrument: Curette Bleeding: Minimum Hemostasis Achieved:  Pressure Procedural Pain: 0 Post Procedural Pain: 0 Response to Treatment: Procedure was tolerated well Level of Consciousness (Post- Awake and Alert procedure): Post Debridement Measurements of Total Wound Length: (cm) 0.9 Width: (cm) 3.5 Depth: (cm) 0.1 Volume: (cm) 0.247 Character of Wound/Ulcer Post Debridement: Improved [name], [name] ([id number]) [id number] Page 2 of 15 Post Procedure Diagnosis Same as Pre-procedure Electronic Signature(s) Signed: 07/15/2023 10:26:47 AM By: [name] Guess MD FACS Signed: 07/15/2023 12:52:17 PM By: Zenaida Deed RN, BSN Entered By: Zenaida Deed on 07/15/2023 06:02:32 -------------------------------------------------------------------------------- Debridement Details Patient Name: Date of Service: [name] 07/15/2023 8:30 A M Medical Record Number: [id number] Patient Account Number: [id number] Date of Birth/Sex: Treating RN: 08-04-1950 (72 y.o. [name] Primary Care Provider: Nicoletta [name] Other Clinician: Referring Provider: Treating Provider/Extender: [name] in Treatment: 74 Debridement Performed for Assessment: Wound #6 Right,Anterior Lower Leg Performed By: Physician [name] Guess, MD The following information was scribed by: Zenaida Deed The information was scribed for: [name] Debridement Type: Debridement Level of Consciousness (Pre-procedure): Awake and Alert Pre-procedure Verification/Time Out Yes - 09:00 Taken: Start Time: 09:00 Pain Control: Lidocaine 4% T opical Solution Percent of Wound Bed Debrided: 100% T Area Debrided (cm): otal 0.03 Tissue and other material debrided: Viable, Non-Viable, Slough, Subcutaneous, Slough Level: Skin/Subcutaneous Tissue Debridement Description: Excisional Instrument: Curette Bleeding: Minimum Hemostasis Achieved: Pressure Procedural Pain: 0 Post Procedural Pain: 0 Response to Treatment:  Procedure was tolerated well Level of Consciousness (Post- Awake and Alert procedure): Post Debridement Measurements of Total Wound Length: (cm) 0.2 Width: (cm) 0.2 Depth: (cm) 0.2 Volume: (cm) 0.006 Character of Wound/Ulcer Post Debridement: Improved Post Procedure Diagnosis Same as Pre-procedure Electronic Signature(s) Signed: 07/15/2023 10:26:47 AM By: [name] Guess MD FACS Signed: 07/15/2023 12:52:17 PM By: Zenaida Deed RN, BSN Entered By: Zenaida Deed on 07/15/2023 06:03:08 HPI Details -------------------------------------------------------------------------------- [name] ([id number]) [id number] Page 3 of 15 Patient Name: Date of Service: [name], [name] 07/15/2023 8:30 A M Medical Record Number: [id number] Patient Account Number: [id number] Date of Birth/Sex: Treating RN: 05/13/1951 (72 y.o. M) Primary Care Provider: Nicoletta [name] Other Clinician:  Referring Provider: Treating Provider/Extender: [name] in Treatment: 50 History of Present Illness HPI Description: ADMISSION 02/09/2022 This is a 72 year old man who recently moved to [place] from [place]. He has a history of of coronary artery disease and prior left popliteal vein DVT . He is not diabetic and does not smoke. 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 He has had multiple spinal surgeries and has limited mobility. He primarily uses a motorized wheelchair. He has had lymphedema for many years and does have lymphedema pumps although he says he does not use them frequently. He has wounds on his bilateral lower extremities. He was receiving care in [place] for these prior to his move. They were apparently packing his wounds with Betadine soaked gauze. He has worn compression wraps in the past but not recently. He did say that they helped tremendously. In clinic today, his right ABI is noncompressible but has good signals; the  left ABI was 1.17. No formal vascular studies have been performed. On his left dorsal foot, there is a circular lesion that exposes the fat layer. There is fairly heavy slough accumulation within the wound, but no significant odor. On his right medial calf, he has multiple pinholes that have slough buildup in them and probe for about 2 to 4 mm in depth. They are draining clear fluid. On his right lateral midfoot, he has ulceration consistent with venous stasis. It is geographic and has slough accumulation. 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 02/17/2022: All of the wounds are little bit larger today and have more slough accumulation. Today, the medial calf openings are larger and probe to something that feels calcified. There is odor coming from his wounds. His vascular studies are scheduled for August 9 and 10. 02-24-2022 upon evaluation today patient presents for follow-up concerning his ongoing issues here with his lower extremities. With that being said he does have significant problems with what appears to be cellulitis unfortunately the PCR culture that was sent last week got crushed by UPS in transit. With that being said we are going to reobtain a culture today although I am actually to do it on the right medial leg as this seems to be the most inflamed area today where there is some questionable drainage as well. I am going to remove some of the calcium deposits which are loosening obtain a deeper culture from this area. Fortunately I do not see any evidence of active infection systemically which is good news but locally I think we are having a bigger issue here. He does have his appointment next Thursday with vascular. Patient has also been referred to [facility] for further evaluation and treatment of the calcinosis for possible sulfate injections. 03/04/2022: The culture that was performed last  week grew out multiple species including Klebsiella, Morganella, and Pseudomonas aeruginosa. He had been prescribed Bactrim but this was not adequate to cover all of the species and levofloxacin was recommended. He completed the Bactrim but did not pick up the levofloxacin and begin taking it until yesterday. 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 He had arterial studies performed today. He does have evidence of peripheral arterial disease. Results copied here: +-------+-----------+-----------+------------+------------+ ABI/TBIT oday's ABIT oday's TBIPrevious ABIPrevious TBI +-------+-----------+-----------+------------+------------+ Right [place]  0.75    +-------+-----------+-----------+------------+------------+ Left [place] 0.57    +-------+-----------+-----------+------------+------------+ Arterial wall calcification precludes accurate ankle pressures and ABIs. Summary: Right: Resting right ankle-brachial index indicates noncompressible right lower extremity arteries. The right toe-brachial index is normal. Left: Resting left ankle-brachial index indicates noncompressible left lower extremity arteries. The left toe-brachial index is abnormal. He continues to have further tissue breakdown at the right medial calf and there is ample slough accumulation along with necrotic subcutaneous tissue. The left dorsal foot wound has built up slough, as well. The right medial foot wound is nearly closed with just a light layer of eschar over the surface. The right dorsal lateral foot wound has also accumulated a fair amount of slough and remains quite tender. 03/10/2022: He has been taking the prescribed levofloxacin and has about 3 days left of this. The erythema and induration on his right leg has improved. He continues to  experience further breakdown of the right medial calf wound. There is extensive slough and debris accumulation there. There is heavy slough on his left dorsal foot wound, but no odor or purulent drainage. The right medial foot wound appears to be closed. The right dorsal lateral foot wound also has a fair amount of slough but it is less tender than at our last visit. He still does not have an appointment with dermatology. 03/22/2022: The right anterior tibial wound has closed. The right lateral foot wound is nearly closed with just a little bit of slough. The left dorsal foot wound is smaller and continues to have some slough buildup but less so than at prior visits. There is good granulation tissue underlying the slough. Unfortunately the right medial calf wound continues to deteriorate. It has a foul odor today and a lot of necrotic tissue, the surrounding skin is also erythematous and moist. 03/30/2022: The right lateral foot wound continues to contract and just has a little bit of slough and eschar present. The left dorsal foot wound is also smaller with slough overlying good granulation tissue. The right medial calf wound actually looks quite a bit better today; there is no odor and there is much less necrotic tissue although some remains present. We have been using topical gentamicin and he has been taking oral Augmentin. 04/07/2022: The patient saw dermatology at [facility] last week. Unfortunately, it appears that they did not read any of the documentation that was provided. 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 Overall, it was a highly unsatisfactory consultation. In the meantime, however, we were able to identify compounding  pharmacy who could create a topical sodium thiosulfate ointment. The patient has that with him today. Both of the right foot wounds are now closed. The left dorsal foot wound has contracted but still has a layer of slough on the surface. The medial calf wounds on the right all look a little bit better. They still have heavy slough along with the chunks of calcium. Everything is stained purple. 04/14/2022: The wound on his dorsal left foot is a little bit smaller again today. There is still slough accumulation on the surface. The medial calf wounds have quite a bit less slough accumulation today. His right leg, however, is warm and erythematous, concerning for cellulitis. [name], [name] ([id number]) [id number] Page 4 of  15 04/14/2022: He completed his course of Augmentin yesterday. The medial calf wound sites are much cleaner today and shallower. There is less fragmented calcium in the wounds. He has a lot of lymphedema fluid-like drainage from these wounds, but no purulent discharge or malodor. The wound on his dorsal left foot has also contracted and is shallower. There is a layer of slough over healthy granulation tissue. 05/05/2022: The left dorsal foot wound is smaller today with less slough and more granulation tissue. The right medial calf wounds are shallower, but have extensive slough and some fat necrosis. The drainage has diminished considerably, however. 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 05/12/2022: The left dorsal foot wound continues to contract and fill with granulation tissue. There is slough on the wound surface. The right medial calf wounds also continue to fill with healthy tissue, but there is remaining slough and fat necrosis present. He had a significant increase in his drainage this week and it was a bright yellow-green color. He  also had a new wound open over his right anterior tibial surface associated with the spike of cutaneous calcium. The leg is more red, as well, concerning for infection. 05/19/2022: His culture returned positive for Pseudomonas. He is currently taking a course of oral levofloxacin. We have also been using topical gentamicin mixed in with his sodium thiosulfate. All of the wounds look better this week. 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 There is slough and nonviable subcutaneous tissue still present. The new anterior tibial wound just has a light layer of slough. The dorsal foot wound also has some slough present and appears a little dry today. 05/26/2022: The right medial leg wound continues to improve. It is feeling with granulation tissue, but still accumulates a fairly thick layer of slough on the surface. The more recent anterior tibial wound has remained quite small, but also has some slough on the surface. The left dorsal foot wound has contracted somewhat and has perimeter epithelialization. He completed his oral levofloxacin. 06/02/2022: The left dorsal foot wound continues to improve significantly. There is just a little slough on the wound surface. The right medial leg wound had black drainage, but it looks like silver alginate was applied last week and I theorized that silver sulfide was created with a combination of the silver alginate and the sodium thiosulfate. It did, however, have a bit of an odor to it and extensive slough accumulation. The small anterior tibial wound also had a bit of slough and nonviable subcutaneous tissue present. The skin of his leg was rather macerated, as well. 06/09/2022: The left dorsal foot wound is smaller again this week with just a light layer of slough on the surface. The right medial leg wound looks substantially better. The leg and periwound skin are no longer red and macerated. There is good granulation tissue  forming with less slough and nonviable tissue. The anterior tibial wound just has a small amount of slough accumulation. 06/16/2022: The left dorsal foot wound continues to contract. His wrap slipped a little bit, however, and his foot is more swollen. The right medial leg wound continues to improve. The underlying tissue is more vital-appearing and there is less slough. He does have substantial drainage. The small anterior tibial wound has a bit of slough accumulation. 06/23/2022: The left dorsal foot wound is about half the size as  it was last week. There is just some light slough on the surface and some eschar buildup around the edges. The right anterior leg wound is small with a little slough on the surface. Unfortunately, the right medial leg wound deteriorated fairly substantially. It is malodorous with gray/green/black discoloration. 06/30/2022: The left dorsal foot wound continues to contract. It is nearly closed with just a light layer of slough present. The right anterior leg wound is about the same size and has a small amount of slough on the surface. The right medial leg wound looks a lot better than it did last week. The odor has abated and the tissue is less pulpy and necrotic-looking. There is still a fairly thick layer of slough present. 12/13; the left dorsal foot wound looks very healthy. There was no need to change the dressing here and no debridement. He has a small area on the right anterior lower leg apparently had not changed much in size. The area on the medial leg is the most problematic area this is large with some depth and a completely nonviable surface today. 07/14/2022: The left dorsal foot wound is smaller today with just a little slough and eschar present. The right anterior lower leg wound is about the same without any significant change. The medial leg wound has a black and green surface and is malodorous. No purulent drainage and the periwound skin is intact.  Edema control is suboptimal. 07/21/2022: The dorsal foot wound continues to contract and is nearly closed with just a little bit of slough and eschar on the surface. The right anterior lower leg wound is shallower today but otherwise the dimensions are unchanged. The medial leg wound looks significantly better although it still has a nonviable surface; the odor and black/green surface has resolved. His culture came back with multiple species sensitive to Augmentin, which was prescribed and a very low level of Pseudomonas, which should be handled by the topical gentamicin we have been using. 07/28/2022: The dorsal foot wound is down to just a very tiny opening with a little eschar on the surface. The right anterior tibial wound is about the same with some slough buildup. The right medial leg wound looks quite a bit better this week. There is thick rubbery slough on the surface, but the odor and drainage has improved significantly. 08/04/2022: The dorsal foot wound is almost healed, but the periwound skin has broken down. This appears to be secondary to moisture and adhesive from the absorbent pad. The right anterior tibial wound is shallower. The right medial leg wound continues to improve. It is smaller, still with rubbery slough on the surface. No malodor or purulent drainage. 08/11/2022: The dorsal foot wound is about the same size. It is a little bit dry with some slough accumulation. The right anterior tibial wound is also unchanged. The right medial leg wound is filling in further with good granulation tissue. Still with some slough buildup on the surface. 08/18/2022: His left leg is bright red, hot, and swollen with cellulitis. The dorsal foot wound actually looks quite good and is superficial with just a little bit of slough accumulation. The right anterior tibial wound is unchanged. The right medial leg wound has filled in with good granulation tissue but has copious amounts of  drainage. 08/25/2022: His cellulitis has responded nicely to the oral antibiotics. His dorsal foot wound is smaller but has a fair amount of slough buildup. The right anterior tibial wound remains stable and unchanged. The right medial leg  wound has more granulation tissue under a layer of slough, but continues to have copious drainage. The drainage is actually causing skin irritation and threatens to result in breakdown. 09/01/2022: He continues to have profuse drainage from his wounds which is resulting in tissue maceration on both the dorsum of his left foot and the periwound distal to his medial leg wound. The dorsal foot wound is nearly healed, despite this. The medial right leg wound is filling in with good granulation tissue. We are working on getting home health assistance to change his dressings more frequently to try and control the drainage better. 09/08/2022: We have had success in controlling his drainage. The tissue maceration has improved significantly and his swelling has come down quite markedly. The medial right leg wound is much more superficial and has substantially less nonviable tissue present. The anterior tibial wound is a little bit larger, however with some nonviable tissue present. 09/15/2022: The dorsal foot wound is nearly closed. The anterior tibial wound measures slightly larger today. The medial right leg wound continues to improve quite dramatically with less nonviable tissue and more robust-looking granulation. Edema control is significantly better. 09/22/2022: The left dorsal foot wound remains open, albeit quite tiny. The anterior tibial wound is a little bit larger again today. The medial right leg wound continues to improve with an increase in robust and healthy granulation tissue and less accumulation of nonviable tissue. 09/29/2022: His home health providers wrapped his left leg extremely tightly and he ended up having to cut the wrap off of his foot. This resulted in  his foot being very swollen and that the dorsal foot wound is a little bit larger today. The anterior tibial wound is about the same size with a little slough on the surface. The medial right leg wound continues to fill in. There are very few areas with any significant depth. There is still slough accumulation. [name], [name] ([id number]) [id number] Page 5 of 15 3/13; Patient has a left dorsal foot wound along with a right anterior tibial wound and right medial wound. We are using silver alginate to the left dorsal foot wound, Santyl and sodium thiosulfate compound ointment to the right lower extremity wounds under 4 layer compression. Patient has no issues or complaints today. 10/13/2022: The left dorsal foot wound is a little bit bigger today, but his foot is also a little bit more swollen. The right medial leg wound has filled in more and has substantially less depth. There is still some nonviable tissue present. The small anterior lower leg wound looks about the same. 10/20/2022: The left dorsal foot wound is epithelializing. There does not appear to be much open at this site. There is some eschar around the edges. The right medial leg wound continues to fill in with granulation tissue. The small anterior lower leg wound is a little bit bigger and there is a chunk of calcium protruding. He continues to have fairly copious drainage from the right leg. 10/27/2022: His home health agency failed to show up at all this past week and as a result, the excessive drainage from his right leg wound has resulted in tissue breakdown. His leg is red and irritated. The wounds themselves look about the same. The wound on his left dorsal foot is nearly closed underneath some eschar. 11/03/2022: We continue to have difficulty with his home health agency. He continues to have significant drainage from his right leg wound which has resulted in even further tissue breakdown. He also was not  taking his Lasix as prescribed and I think much of the drainage has been his lymphedema fluid choosing the path of least resistance. The wound on his left dorsal foot is closed, but in the process of cleaning the site, dry skin was pulled off and he has a new small superficial wound just adjacent to where the dorsal foot wound was. 11/12/2022: His left dorsal foot is completely healed. The right medial leg continues to deteriorate. He is not taking his Lasix as prescribed, nor is he using his lymphedema pumps at all. As a result, he has massive amounts of drainage saturating his dressings and causing further tissue breakdown on his medial leg. Today, his dressings had a foul odor, as well. 11/19/2022: His right medial leg looks even worse today. There is more erythema and moisture related tissue breakdown. The 2 anterior wounds look a little bit better. The culture that I took last week did produce Pseudomonas and he is currently taking levofloxacin for this. 11/26/2022: There has been significant improvement to his right medial leg. It is far less raw and many of the superficial open areas have epithelialized. He continues to cut the foot portion of his wrap and as result his foot is bulbous and swollen compared to the rest of his leg, where edema control is good. The smaller of the 2 anterior tibial wound is closed, while the larger is contracting somewhat with a bit of slough at the base. 12/03/2022: His leg continues to demonstrate improvement. There has been epithelialization of much of the denuded area. He has a new small anterior tibial wound that has opened around a nidus of calcium. Edema control is improved. 12/10/2022: Continued improvement in the periwound skin. The anterior tibial wounds are about the same, but the medial leg wound has improved further. There is less nonviable tissue present. 12/17/2022: Between his visit on Tuesday with the nurse and today, he has had significantly more  drainage which has resulted in some periwound tissue maceration. I suspect this correlates somewhat with his diuretic use, as he says he takes his diuretic religiously on Saturday, Sunday, and Monday and then does not take it after Tuesday due to being out and about and not being close to easy restroom access. I suspect the excess fluid is just simply draining out of his leg. 12/21/2022: This was supposed to be a nurse visit, but his leg has broken down so significantly, that we have converted to a wound care visit. He did take his diuretics over the weekend, but despite this, he has had copious drainage and all of his tissues have broken down substantially from the right medial leg ulcer all the way down to his ankle and even wrapping posterior to this. There is blue-green staining on his dressing, suggesting ongoing presence of Pseudomonas aeruginosa. 12/24/2022: The wound already looks better than it did 2 days ago. He has been taking ciprofloxacin and the Urgo clean dressing we applied did a much better job of wicking the drainage away from his leg. There is slough on the entire surface of the medial leg wound, but the erythema and induration has improved in the past couple of days. 12/31/2022: Continued improvement of the wound. He says that the pain has basically abated since being on ciprofloxacin. Still with fairly heavy drainage and slough accumulation. 01/07/2023: Overall continued improvement. Still with heavy slough buildup, but less drainage. 01/14/2023: There is a little bit of improvement. The skin is still looking a bit excoriated. He has a  few more days left of ciprofloxacin. 01/21/2023: There has been significant improvement. The periwound skin is in much better condition. The ulcers are shallower and actually have some good granulation tissue present under some slough. 01/28/2023: The wound continues to improve. All of the open areas measured smaller today. There is slough accumulation on  the surfaces. His drainage has decreased markedly. 02/04/2023: Continued improvement in the wound. Drainage remains significantly diminished. Slough accumulation is present but to a lesser extent. Periwound skin continues to improve. Edema control is good. 02/18/2023: The heavy drainage persists, although it is was not as significant today as it was at his nurse visit on Tuesday. The drainage is yellow and does not have the blue-green discoloration suggestive of Pseudomonas. The actual wounds on his leg are getting smaller, but the periwound skin is quite excoriated. 02/25/2023: His periwound skin is quite excoriated and he has a lot of clear yellow drainage. The main wounds are filling in further and epithelializing. 03/04/2023: The excoriation of the periwound skin has improved. Most of that area has dried up. The main wounds have less slough in them today. 03/11/2023: The main wounds have accumulated a little bit of slough. The periwound excoriation continues to improve. Unfortunately, he suffered a fall over the previous weekend and has abrasions on his knees bilaterally and on his right wrist and upper arm. In addition, the skin on his left lower leg apparently blistered and opened, leaving several superficial wounds on this extremity. He has been approved for the [facility] Press wound VAC and has the equipment with him today. 03/18/2023: He has extensive excoriation to the periwound skin on the right medial lower leg. The actual wounds themselves are smaller with slough accumulation. The top of his left foot has begun weeping again. Using the [facility] wound VAC, he says that he through 4 canisters. He seems to be very fluid overloaded, without any significant dyspnea. 03/25/2023: The wound to his left anterior tibial surface has healed. The left dorsal foot wound has gotten smaller and has a small area of hypertrophic granulation tissue present. The periwound skin on the right medial lower leg  looks slightly better, but still appears excoriated. The actual wounds continue to contract but are still accumulating quite a bit of slough. The tiny right anterior leg wounds are contracting. 04/01/2023: The left dorsal foot wound got a little bit bigger this week. There is slough accumulation. The periwound skin on the right medial lower leg continues to improve. The main medial right lower leg wounds have a dark gray discoloration and there is a strong odor coming from them. The back of his right lower leg just above the ankle also has a little bit of new breakdown where he is draining edema fluid. The small anterior leg wounds are stable. [name], [name] ([id number]) [id number] Page 6 of 15 04/08/2023: Since starting antibiotics last week, there has been tremendous improvement. The left dorsal foot wound is nearly closed with just a little bit of eschar on the surface. The right medial lower leg is less excoriated and red and he has had substantially less drainage. The amount of slough on the open areas of his wounds has decreased markedly and there is no longer any odor. The back of his right lower leg that was newly open last week has dried up, but is not completely closed yet. 04/15/2023: The improvement continues with significantly less drainage, resulting in less excoriation and irritation. The back of his right lower leg is  closed, as is the left dorsal foot wound. The open areas on the right medial leg are smaller. 04/22/2023: His drainage has diminished significantly. The tissue maceration and excoriation surrounding the right medial leg wound has also improved substantially. There is slough on all of the open wound surfaces. He does have a bit of an odor coming from his wound today, but there is no purulent drainage nor any significant discoloration of the drainage in his VAC. 04/29/2023: He has a new wound on his left lower anterior tibial surface. This was a blister  that came up and then ruptured. There is thin slough on the wound surface but no concern for infection. He has had significantly diminished drainage from his right medial leg ulcers. He was previously going through 4 canisters a day secondary to drainage and this week, he has used only 1 for the entire week. The open areas on his right medial leg are smaller and fewer in number. There is slough on all of the surfaces. No malodor appreciated. 05/06/2023: The wound on his left anterior tibial surface has epithelialized significantly. There is just a small area open in the center. The wound is clean without any slough or other debris accumulation. The drainage on the right leg has almost completely dry. There is slough on the open wound surfaces with significantly more epithelialized between the open sites. He continues on levofloxacin. 05/13/2023: His left lower leg wound is healed. The right lower leg wound continues to improve. The drainage has been minimal and he has not been in the wound VAC for over a week now. There is better skin filling between the open areas and less slough accumulation on the open surfaces. 05/20/2023: The anterior leg wounds are closing up and there is no slough on the open areas that remain. His medial leg wound was measured longer today, but this appears to include some stuck on silver alginate and dry eschar, rather than the actual open wound areas. There is slough accumulation on the open surfaces. Edema control is good and his drainage has not been excessive. 05/27/2023: His wrap slipped up on his foot and he has a new small ulcer on the dorsal aspect of his right foot. The other wounds are basically stable with the usual thick slough accumulation. Drainage remains well-controlled. Edema control is good. 06/03/2023: The dorsal foot ulcer is healed. The other wounds actually are quite a bit smaller this week. There is less depth to the large medial leg ulcers. Edema control  is excellent. 06/10/2023: He has had a new wound open on his left lower leg, secondary to friction from his compression wrap. The medial leg ulcers are all smaller; the anterior wounds were measured larger, but this appears to be due to some minor moisture-related breakdown. He continues on levofloxacin. 06/17/2023: The wound on his left lower leg is nearly closed. There is just a small open area that remains underneath some eschar and dried silver alginate. The medial leg ulcers continue to improve with greater areas of epithelialization between each of the open sites. There is some slough accumulation here. No real change to the anterior leg wounds. Unfortunately, when his wrap was being removed, he was cut with the nurse's scissors so he has a new small wound on his upper right lower leg just below the knee. 07/15/2023: The left lower leg wound is healed. The medial right leg ulcers are significantly smaller since the last time I saw them. There is a wide band of healthy  epithelialized tissue between them. The anterior right leg wound is quite a bit smaller, as well. The wound that occurred as a result of scissor injury several weeks ago has also healed. Edema control is good. Electronic Signature(s) Signed: 07/15/2023 10:26:47 AM By: [name] Guess MD FACS Entered By: [name] on 07/15/2023 06:09:56 -------------------------------------------------------------------------------- Physical Exam Details Patient Name: Date of Service: BALTAZAR, PEROTTI [name] 07/15/2023 8:30 A M Medical Record Number: [id number] Patient Account Number: [id number] Date of Birth/Sex: Treating RN: 1951/05/27 (72 y.o. M) Primary Care Provider: Nicoletta [name] Other Clinician: Referring Provider: Treating Provider/Extender: [name] in Treatment: 22 Constitutional . . . . no acute distress. Respiratory Normal work of breathing on room air.. Notes 07/15/2023: The left lower leg  wound is healed. The medial right leg ulcers are significantly smaller since the last time I saw them. There is a wide band of healthy epithelialized tissue between them. The anterior right leg wound is quite a bit smaller, as well. The wound that occurred as a result of scissor injury several weeks ago has also healed. Edema control is good. Electronic Signature(s) Signed: 07/15/2023 10:26:47 AM By: [name] Guess MD FACS Entered By: [name] on 07/15/2023 06:17:24 [name] ([id number]) [id number] Page 7 of 15 -------------------------------------------------------------------------------- Physician Orders Details Patient Name: Date of Service: [name], PETROSYAN 07/15/2023 8:30 A M Medical Record Number: [id number] Patient Account Number: [id number] Date of Birth/Sex: Treating RN: 08/06/1950 (71 y.o. 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 - Dr. [name] Return A Anesthetic (In clinic) Topical Lidocaine 4% applied to wound bed Bathing/ Shower/ Hygiene May shower with protection but do not get wound  dressing(s) wet. Protect dressing(s) with water repellant cover (for example, large plastic bag) or a cast cover and may then take shower. Lymphedema Treatment Plan - Exercise, Compression and Elevation Bilateral Lower Extremities Exercise daily as tolerated. (Walking, ROM, Calf Pumps and Toe Taps) Compression Garments, Class II. Wear daily when out of bed. May remove at bedtime Compression Wraps as ordered Elevate legs 30 - 60 minutes at or above heart level at least 3 - 4 times daily as able/tolerated Avoid standing for long periods and elevate leg(s) parallel to the floor when sitting Use Pneumatic Compression Device on leg(s) 2-3 times a day for 45-60 minutes Wound Treatment Wound #3 - Lower Leg Wound Laterality: Right, Medial Cleanser: Soap and Water 1 x Per Week/30 Days Discharge Instructions: May shower and wash wound with dial antibacterial soap and water prior to dressing change. Cleanser: Wound Cleanser 1 x Per Week/30 Days Discharge Instructions: Cleanse the wound with wound cleanser prior to applying a clean dressing using gauze sponges, not tissue or cotton balls. Peri-Wound Care: Skin Prep 1 x Per Week/30 Days Discharge Instructions: cavelon to macerated wound margins Peri-Wound Care: Sween Lotion (Moisturizing lotion) 1 x Per Week/30 Days  Discharge Instructions: Apply moisturizing lotion as directed Prim Dressing: Maxorb Extra Ag+ Alginate Dressing, 4x4.75 (in/in) 1 x Per Week/30 Days ary Discharge Instructions: Apply to excoriated weeping areas Secondary Dressing: ABD Pad, 8x10 1 x Per Week/30 Days Discharge Instructions: Apply over primary dressing as directed. Compression Wrap: ThreePress (3 layer compression wrap) 1 x Per Week/30 Days Discharge Instructions: Apply three layer compression as directed. Compression Wrap: Unnaboot w/Calamine, 4x10 (in/yd) 1 x Per Week/30 Days CALEY, FARNER ([id number]) [id number] Page 8 of 15 Discharge  Instructions: Apply Unnaboot on distal foot and top of lower leg Wound #6 - Lower Leg Wound Laterality: Right, Anterior Cleanser: Soap and Water 1 x Per Week/30 Days Discharge Instructions: May shower and wash wound with dial antibacterial soap and water prior to dressing change. Cleanser: Wound Cleanser 1 x Per Week/30 Days Discharge Instructions: Cleanse the wound with wound cleanser prior to applying a clean dressing using gauze sponges, not tissue or cotton balls. Peri-Wound Care: Sween Lotion (Moisturizing lotion) 1 x Per Week/30 Days Discharge Instructions: Apply moisturizing lotion as directed Prim Dressing: Maxorb Extra Ag+ Alginate Dressing, 2x2 (in/in) 1 x Per Week/30 Days ary Discharge Instructions: Apply to wound bed as instructed Secondary Dressing: ABD Pad, 8x10 1 x Per Week/30 Days Discharge Instructions: Apply over primary dressing as directed. Compression Wrap: ThreePress (3 layer compression wrap) 1 x Per Week/30 Days Discharge Instructions: Apply three layer compression as directed. Electronic Signature(s) Signed: 07/15/2023 10:26:47 AM By: [name] Guess MD FACS Entered By: [name] on 07/15/2023 06:17:42 -------------------------------------------------------------------------------- Problem List Details Patient Name: Date of Service: [name] 07/15/2023 8:30 A M Medical Record Number: [id number] Patient Account Number: [id number] Date of Birth/Sex: Treating RN: Mar 03, 1951 (72 y.o. 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 02/09/2022 No Yes I89.0 Lymphedema, not elsewhere classified 02/09/2022 No Yes I10 Essential (primary) hypertension 02/09/2022 No Yes I25.10 Atherosclerotic heart disease of native coronary artery without angina pectoris 02/09/2022 No Yes I87.2 Venous insufficiency (chronic) (peripheral) 02/09/2022 No Yes HILTON, SOFIELD ([id number]) [id number] Page 9 of [phone] Personal history of other venous thrombosis and embolism 02/09/2022 No Yes Inactive Problems Resolved Problems ICD-10 Code Description Active Date Resolved Date L97.821 Non-pressure chronic ulcer of other part of left lower leg limited to breakdown of skin 03/11/2023 03/11/2023 L98.492 Non-pressure chronic ulcer of skin of other sites with fat layer exposed 03/11/2023 03/11/2023 L97.521 Non-pressure chronic ulcer of other part of left foot limited to breakdown of skin 03/18/2023 02/09/2022 L97.512 Non-pressure chronic ulcer of other part of right foot with fat layer exposed 05/27/2023 02/17/2022 Electronic Signature(s) Signed: 07/15/2023 10:26:47 AM By: [name] Guess MD FACS Entered By: [name] on 07/15/2023 06:07:54 -------------------------------------------------------------------------------- Progress Note Details Patient Name: Date of Service: [name] 07/15/2023 8:30 A M Medical Record Number: [id number] Patient Account Number: [id number] Date of Birth/Sex: Treating RN: February 01, 1951 (72 y.o. M) Primary Care Provider: Nicoletta [name] Other Clinician: Referring Provider: Treating Provider/Extender: [name] in Treatment: 32 Subjective Chief Complaint Information obtained from Patient Patient seen for complaints of Non-Healing Wounds. History of Present Illness (HPI) ADMISSION 02/09/2022 This is a 72 year old man who recently moved  to [place] from [place]. He has a history of of coronary artery disease and prior left popliteal  vein DVT . He is not diabetic and does not smoke. 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 He has had multiple spinal surgeries and has limited mobility. He primarily uses a motorized wheelchair. He has had lymphedema for many years and does have lymphedema pumps although he says he does not use them frequently. He has wounds on his bilateral lower extremities. He was receiving care in [place] for these prior to his move. They were apparently packing his wounds with Betadine soaked gauze. He has worn compression wraps in the past but not recently. He did say that they helped tremendously. In clinic today, his right ABI is noncompressible but has good signals; the left ABI was 1.17. No formal vascular studies have been performed. On his left dorsal foot, there is a circular lesion that exposes the fat layer. There is fairly heavy slough accumulation within the wound, but no significant odor. On his right medial calf, he has multiple pinholes that have slough buildup in them and probe for about 2 to 4 mm in depth. They are draining clear fluid. On his right lateral midfoot, he has ulceration consistent with venous stasis. It is geographic and has slough accumulation. 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 02/17/2022: All of the wounds are little bit larger today and have more slough accumulation. Today, the medial calf openings are larger and probe to something that feels calcified. There is odor coming from his wounds. His vascular studies are scheduled for August 9 and 10. 02-24-2022 upon evaluation today patient presents for follow-up concerning his ongoing issues here with his lower extremities. With that being said he does have significant problems with what appears to be cellulitis unfortunately the PCR culture that was sent last week got crushed by [employer] in transit. With that being said we are  going to reobtain a culture today although I am actually to do it on the right medial leg as this seems to be the most inflamed area today where there is some questionable drainage as well. I am going to remove some of the calcium deposits which are loosening obtain a deeper culture from this area. Fortunately I do not see any evidence of active infection systemically which is good news but locally I think we are having a bigger issue here. He does have his appointment next Thursday with vascular. Patient has also been referred to [facility] for further evaluation and treatment of the calcinosis for possible [name], [name] ([id number]) [id number] Page 10 of 15 sulfate injections. 03/04/2022: The culture that was performed last week grew out multiple species including Klebsiella, Morganella, and Pseudomonas aeruginosa. He had been prescribed Bactrim but this was not adequate to cover all of the species and levofloxacin was recommended. He completed the Bactrim but did not pick up the levofloxacin and begin taking it until yesterday. 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 He had arterial studies performed today. He does have evidence of peripheral arterial disease. Results copied here: +-------+-----------+-----------+------------+------------+ ABI/TBIT oday's ABIT oday's TBIPrevious ABIPrevious TBI +-------+-----------+-----------+------------+------------+ Right [place] 0.75    +-------+-----------+-----------+------------+------------+ Left [place] 0.57    +-------+-----------+-----------+------------+------------+ Arterial wall calcification precludes accurate ankle pressures and  ABIs. Summary: Right: Resting right ankle-brachial index indicates noncompressible right lower extremity  arteries. The right toe-brachial index is normal. Left: Resting left ankle-brachial index indicates noncompressible left lower extremity arteries. The left toe-brachial index is abnormal. He continues to have further tissue breakdown at the right medial calf and there is ample slough accumulation along with necrotic subcutaneous tissue. The left dorsal foot wound has built up slough, as well. The right medial foot wound is nearly closed with just a light layer of eschar over the surface. The right dorsal lateral foot wound has also accumulated a fair amount of slough and remains quite tender. 03/10/2022: He has been taking the prescribed levofloxacin and has about 3 days left of this. The erythema and induration on his right leg has improved. He continues to experience further breakdown of the right medial calf wound. There is extensive slough and debris accumulation there. There is heavy slough on his left dorsal foot wound, but no odor or purulent drainage. The right medial foot wound appears to be closed. The right dorsal lateral foot wound also has a fair amount of slough but it is less tender than at our last visit. He still does not have an appointment with dermatology. 03/22/2022: The right anterior tibial wound has closed. The right lateral foot wound is nearly closed with just a little bit of slough. The left dorsal foot wound is smaller and continues to have some slough buildup but less so than at prior visits. There is good granulation tissue underlying the slough. Unfortunately the right medial calf wound continues to deteriorate. It has a foul odor today and a lot of necrotic tissue, the surrounding skin is also erythematous and moist. 03/30/2022: The right lateral foot wound continues to contract and just has a little bit of slough and eschar present. The left dorsal foot wound is also smaller with slough overlying good granulation tissue. The right medial calf wound actually looks quite a  bit better today; there is no odor and there is much less necrotic tissue although some remains present. We have been using topical gentamicin and he has been taking oral Augmentin. 04/07/2022: The patient saw dermatology at [facility] last week. Unfortunately, it appears that they did not read any of the documentation that was provided. 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 Overall, it was a highly unsatisfactory consultation. In the meantime, however, we were able to identify compounding pharmacy who could create a topical sodium thiosulfate ointment. The patient has that with him today. Both of the right foot wounds are now closed. The left dorsal foot wound has contracted but still has a layer of slough on the surface. The medial calf wounds on the right all look a little bit better. They still have heavy slough along with the chunks of calcium. Everything is stained purple. 04/14/2022: The wound on his dorsal left foot is a little bit smaller again today. There is still slough accumulation on the surface. The medial calf wounds have quite a bit less slough accumulation today. His right leg, however, is warm and erythematous, concerning for cellulitis. 04/14/2022: He completed his course of Augmentin yesterday. The medial calf wound sites are much cleaner today and shallower. There is less fragmented calcium in the wounds.  He has a lot of lymphedema fluid-like drainage from these wounds, but no purulent discharge or malodor. The wound on his dorsal left foot has also contracted and is shallower. There is a layer of slough over healthy granulation tissue. 05/05/2022: The left dorsal foot wound is smaller today with less slough and  more granulation tissue. The right medial calf wounds are shallower, but have extensive slough and some fat necrosis. The drainage has diminished considerably, however. 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 05/12/2022: The left dorsal foot wound continues to contract and fill with granulation tissue. There is slough on the wound surface. The right medial calf wounds also continue to fill with healthy tissue, but there is remaining slough and fat necrosis present. He had a significant increase in his drainage this week and it was a bright yellow-green color. He also had a new wound open over his right anterior tibial surface associated with the spike of cutaneous calcium. The leg is more red, as well, concerning for infection. 05/19/2022: His culture returned positive for Pseudomonas. He is currently taking a course of oral levofloxacin. We have also been using topical gentamicin mixed in with his sodium thiosulfate. All of the wounds look better this week. 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 There is slough and nonviable subcutaneous tissue still present. The new anterior tibial wound just has a light layer of slough. The dorsal foot wound also has some slough present and appears a little dry today. 05/26/2022: The right medial leg wound continues to improve. It is feeling with granulation tissue, but still accumulates a fairly thick layer of slough on the surface. The more recent anterior tibial wound has remained quite small, but also has some slough on the surface. The left dorsal foot wound has contracted somewhat and has perimeter epithelialization. He completed his oral levofloxacin. 06/02/2022: The left dorsal foot wound continues to improve significantly. There is just a little slough on the wound surface. The right medial leg wound had  black drainage, but it looks like silver alginate was applied last week and I theorized that silver sulfide was created with a combination of the silver alginate and the sodium thiosulfate. It did, however, have a bit of an odor to it and extensive slough accumulation. The small anterior tibial wound also had a bit of slough and nonviable subcutaneous tissue present. The skin of his leg was rather macerated, as well. 06/09/2022: The left dorsal foot wound is smaller again this week with just a light layer of slough on the surface. The right medial leg wound looks substantially better. The leg and periwound skin are no longer red and macerated. There is good granulation tissue forming with less slough and nonviable tissue. The anterior tibial wound just has a small amount of slough accumulation. 06/16/2022: The left dorsal foot wound continues to contract. His wrap slipped a little bit, however, and his foot is more swollen. The right medial leg wound continues to improve. The underlying tissue is more vital-appearing and there is less slough. He does have substantial drainage. The small anterior tibial wound has a bit of slough accumulation. [name], [name] ([id number]) [id number] Page 11 of 15 06/23/2022: The left dorsal foot wound is about half the size as it was last week. There is just some light slough on the surface and some eschar buildup around the edges.  The right anterior leg wound is small with a little slough on the surface. Unfortunately, the right medial leg wound deteriorated fairly substantially. It is malodorous with gray/green/black discoloration. 06/30/2022: The left dorsal foot wound continues to contract. It is nearly closed with just a light layer of slough present. The right anterior leg wound is about the same size and has a small amount of slough on the surface. The right medial leg wound looks a lot better than it did last week. The odor has abated and  the tissue is less pulpy and necrotic-looking. There is still a fairly thick layer of slough present. 12/13; the left dorsal foot wound looks very healthy. There was no need to change the dressing here and no debridement. He has a small area on the right anterior lower leg apparently had not changed much in size. The area on the medial leg is the most problematic area this is large with some depth and a completely nonviable surface today. 07/14/2022: The left dorsal foot wound is smaller today with just a little slough and eschar present. The right anterior lower leg wound is about the same without any significant change. The medial leg wound has a black and green surface and is malodorous. No purulent drainage and the periwound skin is intact. Edema control is suboptimal. 07/21/2022: The dorsal foot wound continues to contract and is nearly closed with just a little bit of slough and eschar on the surface. The right anterior lower leg wound is shallower today but otherwise the dimensions are unchanged. The medial leg wound looks significantly better although it still has a nonviable surface; the odor and black/green surface has resolved. His culture came back with multiple species sensitive to Augmentin, which was prescribed and a very low level of Pseudomonas, which should be handled by the topical gentamicin we have been using. 07/28/2022: The dorsal foot wound is down to just a very tiny opening with a little eschar on the surface. The right anterior tibial wound is about the same with some slough buildup. The right medial leg wound looks quite a bit better this week. There is thick rubbery slough on the surface, but the odor and drainage has improved significantly. 08/04/2022: The dorsal foot wound is almost healed, but the periwound skin has broken down. This appears to be secondary to moisture and adhesive from the absorbent pad. The right anterior tibial wound is shallower. The right medial leg  wound continues to improve. It is smaller, still with rubbery slough on the surface. No malodor or purulent drainage. 08/11/2022: The dorsal foot wound is about the same size. It is a little bit dry with some slough accumulation. The right anterior tibial wound is also unchanged. The right medial leg wound is filling in further with good granulation tissue. Still with some slough buildup on the surface. 08/18/2022: His left leg is bright red, hot, and swollen with cellulitis. The dorsal foot wound actually looks quite good and is superficial with just a little bit of slough accumulation. The right anterior tibial wound is unchanged. The right medial leg wound has filled in with good granulation tissue but has copious amounts of drainage. 08/25/2022: His cellulitis has responded nicely to the oral antibiotics. His dorsal foot wound is smaller but has a fair amount of slough buildup. The right anterior tibial wound remains stable and unchanged. The right medial leg wound has more granulation tissue under a layer of slough, but continues to have copious drainage. The drainage is actually  causing skin irritation and threatens to result in breakdown. 09/01/2022: He continues to have profuse drainage from his wounds which is resulting in tissue maceration on both the dorsum of his left foot and the periwound distal to his medial leg wound. The dorsal foot wound is nearly healed, despite this. The medial right leg wound is filling in with good granulation tissue. We are working on getting home health assistance to change his dressings more frequently to try and control the drainage better. 09/08/2022: We have had success in controlling his drainage. The tissue maceration has improved significantly and his swelling has come down quite markedly. The medial right leg wound is much more superficial and has substantially less nonviable tissue present. The anterior tibial wound is a little bit larger, however with some  nonviable tissue present. 09/15/2022: The dorsal foot wound is nearly closed. The anterior tibial wound measures slightly larger today. The medial right leg wound continues to improve quite dramatically with less nonviable tissue and more robust-looking granulation. Edema control is significantly better. 09/22/2022: The left dorsal foot wound remains open, albeit quite tiny. The anterior tibial wound is a little bit larger again today. The medial right leg wound continues to improve with an increase in robust and healthy granulation tissue and less accumulation of nonviable tissue. 09/29/2022: His home health providers wrapped his left leg extremely tightly and he ended up having to cut the wrap off of his foot. This resulted in his foot being very swollen and that the dorsal foot wound is a little bit larger today. The anterior tibial wound is about the same size with a little slough on the surface. The medial right leg wound continues to fill in. There are very few areas with any significant depth. There is still slough accumulation. 3/13; Patient has a left dorsal foot wound along with a right anterior tibial wound and right medial wound. We are using silver alginate to the left dorsal foot wound, Santyl and sodium thiosulfate compound ointment to the right lower extremity wounds under 4 layer compression. Patient has no issues or complaints today. 10/13/2022: The left dorsal foot wound is a little bit bigger today, but his foot is also a little bit more swollen. The right medial leg wound has filled in more and has substantially less depth. There is still some nonviable tissue present. The small anterior lower leg wound looks about the same. 10/20/2022: The left dorsal foot wound is epithelializing. There does not appear to be much open at this site. There is some eschar around the edges. The right medial leg wound continues to fill in with granulation tissue. The small anterior lower leg wound is a  little bit bigger and there is a chunk of calcium protruding. He continues to have fairly copious drainage from the right leg. 10/27/2022: His home health agency failed to show up at all this past week and as a result, the excessive drainage from his right leg wound has resulted in tissue breakdown. His leg is red and irritated. The wounds themselves look about the same. The wound on his left dorsal foot is nearly closed underneath some eschar. 11/03/2022: We continue to have difficulty with his home health agency. He continues to have significant drainage from his right leg wound which has resulted in even further tissue breakdown. He also was not taking his Lasix as prescribed and I think much of the drainage has been his lymphedema fluid choosing the path of least resistance. The wound on his left  dorsal foot is closed, but in the process of cleaning the site, dry skin was pulled off and he has a new small superficial wound just adjacent to where the dorsal foot wound was. 11/12/2022: His left dorsal foot is completely healed. The right medial leg continues to deteriorate. He is not taking his Lasix as prescribed, nor is he using his lymphedema pumps at all. As a result, he has massive amounts of drainage saturating his dressings and causing further tissue breakdown on his medial leg. Today, his dressings had a foul odor, as well. 11/19/2022: His right medial leg looks even worse today. There is more erythema and moisture related tissue breakdown. The 2 anterior wounds look a little bit better. The culture that I took last week did produce Pseudomonas and he is currently taking levofloxacin for this. 11/26/2022: There has been significant improvement to his right medial leg. It is far less raw and many of the superficial open areas have epithelialized. He continues to cut the foot portion of his wrap and as result his foot is bulbous and swollen compared to the rest of his leg, where edema control is  good. The smaller of the 2 anterior tibial wound is closed, while the larger is contracting somewhat with a bit of slough at the base. 12/03/2022: His leg continues to demonstrate improvement. There has been epithelialization of much of the denuded area. He has a new small anterior tibial wound that has opened around a nidus of calcium. Edema control is improved. JAYSION, CAPIZZI ([id number]) [id number] Page 12 of 15 12/10/2022: Continued improvement in the periwound skin. The anterior tibial wounds are about the same, but the medial leg wound has improved further. There is less nonviable tissue present. 12/17/2022: Between his visit on Tuesday with the nurse and today, he has had significantly more drainage which has resulted in some periwound tissue maceration. I suspect this correlates somewhat with his diuretic use, as he says he takes his diuretic religiously on Saturday, Sunday, and Monday and then does not take it after Tuesday due to being out and about and not being close to easy restroom access. I suspect the excess fluid is just simply draining out of his leg. 12/21/2022: This was supposed to be a nurse visit, but his leg has broken down so significantly, that we have converted to a wound care visit. He did take his diuretics over the weekend, but despite this, he has had copious drainage and all of his tissues have broken down substantially from the right medial leg ulcer all the way down to his ankle and even wrapping posterior to this. There is blue-green staining on his dressing, suggesting ongoing presence of Pseudomonas aeruginosa. 12/24/2022: The wound already looks better than it did 2 days ago. He has been taking ciprofloxacin and the Urgo clean dressing we applied did a much better job of wicking the drainage away from his leg. There is slough on the entire surface of the medial leg wound, but the erythema and induration has improved in the past couple of  days. 12/31/2022: Continued improvement of the wound. He says that the pain has basically abated since being on ciprofloxacin. Still with fairly heavy drainage and slough accumulation. 01/07/2023: Overall continued improvement. Still with heavy slough buildup, but less drainage. 01/14/2023: There is a little bit of improvement. The skin is still looking a bit excoriated. He has a few more days left of ciprofloxacin. 01/21/2023: There has been significant improvement. The periwound skin is in much better  condition. The ulcers are shallower and actually have some good granulation tissue present under some slough. 01/28/2023: The wound continues to improve. All of the open areas measured smaller today. There is slough accumulation on the surfaces. His drainage has decreased markedly. 02/04/2023: Continued improvement in the wound. Drainage remains significantly diminished. Slough accumulation is present but to a lesser extent. Periwound skin continues to improve. Edema control is good. 02/18/2023: The heavy drainage persists, although it is was not as significant today as it was at his nurse visit on Tuesday. The drainage is yellow and does not have the blue-green discoloration suggestive of Pseudomonas. The actual wounds on his leg are getting smaller, but the periwound skin is quite excoriated. 02/25/2023: His periwound skin is quite excoriated and he has a lot of clear yellow drainage. The main wounds are filling in further and epithelializing. 03/04/2023: The excoriation of the periwound skin has improved. Most of that area has dried up. The main wounds have less slough in them today. 03/11/2023: The main wounds have accumulated a little bit of slough. The periwound excoriation continues to improve. Unfortunately, he suffered a fall over the previous weekend and has abrasions on his knees bilaterally and on his right wrist and upper arm. In addition, the skin on his left lower leg apparently blistered and  opened, leaving several superficial wounds on this extremity. He has been approved for the [facility] wound VAC and has the equipment with him today. 03/18/2023: He has extensive excoriation to the periwound skin on the right medial lower leg. The actual wounds themselves are smaller with slough accumulation. The top of his left foot has begun weeping again. Using the [facility] and [facility] wound VAC, he says that he through 4 canisters. He seems to be very fluid overloaded, without any significant dyspnea. 03/25/2023: The wound to his left anterior tibial surface has healed. The left dorsal foot wound has gotten smaller and has a small area of hypertrophic granulation tissue present. The periwound skin on the right medial lower leg looks slightly better, but still appears excoriated. The actual wounds continue to contract but are still accumulating quite a bit of slough. The tiny right anterior leg wounds are contracting. 04/01/2023: The left dorsal foot wound got a little bit bigger this week. There is slough accumulation. The periwound skin on the right medial lower leg continues to improve. The main medial right lower leg wounds have a dark gray discoloration and there is a strong odor coming from them. The back of his right lower leg just above the ankle also has a little bit of new breakdown where he is draining edema fluid. The small anterior leg wounds are stable. 04/08/2023: Since starting antibiotics last week, there has been tremendous improvement. The left dorsal foot wound is nearly closed with just a little bit of eschar on the surface. The right medial lower leg is less excoriated and red and he has had substantially less drainage. The amount of slough on the open areas of his wounds has decreased markedly and there is no longer any odor. The back of his right lower leg that was newly open last week has dried up, but is not completely closed yet. 04/15/2023: The improvement continues with  significantly less drainage, resulting in less excoriation and irritation. The back of his right lower leg is closed, as is the left dorsal foot wound. The open areas on the right medial leg are smaller. 04/22/2023: His drainage has diminished significantly. The tissue maceration  and excoriation surrounding the right medial leg wound has also improved substantially. There is slough on all of the open wound surfaces. He does have a bit of an odor coming from his wound today, but there is no purulent drainage nor any significant discoloration of the drainage in his VAC. 04/29/2023: He has a new wound on his left lower anterior tibial surface. This was a blister that came up and then ruptured. There is thin slough on the wound surface but no concern for infection. He has had significantly diminished drainage from his right medial leg ulcers. He was previously going through 4 canisters a day secondary to drainage and this week, he has used only 1 for the entire week. The open areas on his right medial leg are smaller and fewer in number. There is slough on all of the surfaces. No malodor appreciated. 05/06/2023: The wound on his left anterior tibial surface has epithelialized significantly. There is just a small area open in the center. The wound is clean without any slough or other debris accumulation. The drainage on the right leg has almost completely dry. There is slough on the open wound surfaces with significantly more epithelialized between the open sites. He continues on levofloxacin. 05/13/2023: His left lower leg wound is healed. The right lower leg wound continues to improve. The drainage has been minimal and he has not been in the wound VAC for over a week now. There is better skin filling between the open areas and less slough accumulation on the open surfaces. 05/20/2023: The anterior leg wounds are closing up and there is no slough on the open areas that remain. His medial leg wound was  measured longer today, but this appears to include some stuck on silver alginate and dry eschar, rather than the actual open wound areas. There is slough accumulation on the open surfaces. Edema control is good and his drainage has not been excessive. 05/27/2023: His wrap slipped up on his foot and he has a new small ulcer on the dorsal aspect of his right foot. The other wounds are basically stable with the usual thick slough accumulation. Drainage remains well-controlled. Edema control is good. 06/03/2023: The dorsal foot ulcer is healed. The other wounds actually are quite a bit smaller this week. There is less depth to the large medial leg ulcers. Edema control is excellent. 06/10/2023: He has had a new wound open on his left lower leg, secondary to friction from his compression wrap. The medial leg ulcers are all smaller; the [name], [name] ([id number]) [id number] Page 13 of 15 anterior wounds were measured larger, but this appears to be due to some minor moisture-related breakdown. He continues on levofloxacin. 06/17/2023: The wound on his left lower leg is nearly closed. There is just a small open area that remains underneath some eschar and dried silver alginate. The medial leg ulcers continue to improve with greater areas of epithelialization between each of the open sites. There is some slough accumulation here. No real change to the anterior leg wounds. Unfortunately, when his wrap was being removed, he was cut with the nurse's scissors so he has a new small wound on his upper right lower leg just below the knee. 07/15/2023: The left lower leg wound is healed. The medial right leg ulcers are significantly smaller since the last time I saw them. There is a wide band of healthy epithelialized tissue between them. The anterior right leg wound is quite a bit smaller, as well. The wound that occurred  as a result of scissor injury several weeks ago has also healed. Edema  control is good. Objective Constitutional no acute distress. Vitals Time Taken: 8:25 AM, Height: 71 in, Weight: 267 lbs, BMI: 37.2, Temperature: 97.5 F, Pulse: 60 bpm, Respiratory Rate: 18 breaths/min, Blood Pressure: 133/49 mmHg. Respiratory Normal work of breathing on room air.. General Notes: 07/15/2023: The left lower leg wound is healed. The medial right leg ulcers are significantly smaller since the last time I saw them. There is a wide band of healthy epithelialized tissue between them. The anterior right leg wound is quite a bit smaller, as well. The wound that occurred as a result of scissor injury several weeks ago has also healed. Edema control is good. Integumentary (Hair, Skin) Wound #12 status is Healed - Epithelialized. Original cause of wound was Gradually Appeared. The date acquired was: 05/27/2023. The wound has been in treatment 5 weeks. The wound is located on the Left,Anterior Lower Leg. The wound measures 0cm length x 0cm width x 0cm depth; 0cm^2 area and 0cm^3 volume. There is a none present amount of drainage noted. Wound #3 status is Open. Original cause of wound was Gradually Appeared. The date acquired was: 12/07/2021. The wound has been in treatment 74 weeks. The wound is located on the Right,Medial Lower Leg. The wound measures 0.9cm length x 3.5cm width x 0.1cm depth; 2.474cm^2 area and 0.247cm^3 volume. There is Fat Layer (Subcutaneous Tissue) exposed. There is no tunneling or undermining noted. There is a medium amount of serous drainage noted. The wound margin is distinct with the outline attached to the wound base. There is large (67-100%) red granulation within the wound bed. There is a small (1-33%) amount of necrotic tissue within the wound bed including Adherent Slough. The periwound skin appearance exhibited: Hemosiderin Staining. The periwound skin appearance did not exhibit: Callus, Crepitus, Excoriation, Induration, Rash, Scarring, Dry/Scaly, Maceration,  Atrophie Blanche, Cyanosis, Ecchymosis, Mottled, Pallor, Rubor, Erythema. Periwound temperature was noted as No Abnormality. Wound #6 status is Open. Original cause of wound was Gradually Appeared. The date acquired was: 05/12/2022. The wound has been in treatment 61 weeks. The wound is located on the Right,Anterior Lower Leg. The wound measures 0.2cm length x 0.2cm width x 0.2cm depth; 0.031cm^2 area and 0.006cm^3 volume. There is Fat Layer (Subcutaneous Tissue) exposed. There is no tunneling or undermining noted. There is a small amount of serous drainage noted. The wound margin is distinct with the outline attached to the wound base. There is large (67-100%) red, pink granulation within the wound bed. There is a small (1- 33%) amount of necrotic tissue within the wound bed including Adherent Slough. The periwound skin appearance exhibited: Hemosiderin Staining. The periwound skin appearance did not exhibit: Callus, Crepitus, Excoriation, Induration, Rash, Scarring, Dry/Scaly, Maceration, Atrophie Blanche, Cyanosis, Ecchymosis, Mottled, Pallor, Rubor, Erythema. Periwound temperature was noted as No Abnormality. 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 There was a Excisional Skin/Subcutaneous Tissue Debridement with a total area of 1.24 sq cm performed by [name] Guess, MD. With the following instrument(s): Curette to remove Viable and  Non-Viable tissue/material. Material removed includes Subcutaneous Tissue and Slough and after achieving pain control using Lidocaine 4% T opical Solution. No specimens were taken. A time out was conducted at 09:00, prior to the start of the procedure. A Minimum amount of bleeding was controlled with Pressure. The procedure was tolerated well with a pain level of 0 throughout and a pain level of 0 following the procedure. Post Debridement Measurements: 0.9cm length x 3.5cm width x 0.1cm depth; 0.247cm^3 volume. RANDI, [name] ([id number]) [id number] Page 14 of 15 Character of Wound/Ulcer Post Debridement is improved. Post procedure Diagnosis Wound #3: Same as Pre-Procedure Pre-procedure diagnosis of Wound #3 is a Lymphedema located on the Right,Medial Lower Leg . There was a Three Layer Compression Therapy Procedure by Zenaida Deed, RN. 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 There was a Excisional Skin/Subcutaneous Tissue Debridement with a total area of 0.03 sq cm performed by [name] Guess, MD. With the following instrument(s): Curette to remove Viable and Non-Viable tissue/material. Material removed includes Subcutaneous Tissue and Slough and after achieving pain control using Lidocaine 4% T opical Solution. No specimens were taken. A time out was conducted at 09:00, prior to the start of the procedure. A Minimum amount of bleeding was controlled with Pressure. The procedure was tolerated well with a pain level of 0 throughout and a pain level of 0 following the procedure. Post Debridement Measurements: 0.2cm length x 0.2cm width x 0.2cm depth; 0.006cm^3 volume. Character of Wound/Ulcer Post Debridement is improved. Post procedure Diagnosis Wound #6: Same as Pre-Procedure Plan Follow-up Appointments: Return Appointment in 1 week. - Dr.  [name] Anesthetic: (In clinic) Topical Lidocaine 4% applied to wound bed Bathing/ Shower/ Hygiene: May shower with protection but do not get wound dressing(s) wet. Protect dressing(s) with water repellant cover (for example, large plastic bag) or a cast cover and may then take shower. Lymphedema Treatment Plan - Exercise, Compression and Elevation: Exercise daily as tolerated. (Walking, ROM, Calf Pumps and T T oe aps) Compression Garments, Class II. Wear daily when out of bed. May remove at bedtime Compression Wraps as ordered Elevate legs 30 - 60 minutes at or above heart level at least 3 - 4 times daily as able/tolerated Avoid standing for long periods and elevate leg(s) parallel to the floor when sitting Use Pneumatic Compression Device on leg(s) 2-3 times a day for 45-60 minutes WOUND #3: - Lower Leg Wound Laterality: Right, Medial Cleanser: Soap and Water 1 x Per Week/30 Days Discharge Instructions: May shower and wash wound with dial antibacterial soap and water prior to dressing change. Cleanser: Wound Cleanser 1 x Per Week/30 Days Discharge Instructions: Cleanse the wound with wound cleanser prior to applying a clean dressing using gauze sponges, not tissue or cotton balls. Peri-Wound Care: Skin Prep 1 x Per Week/30 Days Discharge Instructions: cavelon to macerated wound margins Peri-Wound Care: Sween Lotion (Moisturizing lotion) 1 x Per Week/30 Days Discharge Instructions: Apply moisturizing lotion as directed Prim Dressing: Maxorb Extra Ag+ Alginate Dressing, 4x4.75 (in/in) 1 x Per Week/30 Days ary Discharge Instructions: Apply to excoriated weeping areas Secondary Dressing: ABD Pad, 8x10 1 x Per Week/30 Days Discharge Instructions: Apply over primary dressing as directed. Com pression Wrap: ThreePress (3 layer compression wrap) 1 x Per Week/30 Days Discharge Instructions: Apply three layer compression as directed. Com pression Wrap: Unnaboot w/Calamine, 4x10 (in/yd) 1 x Per  Week/30 Days Discharge Instructions: Apply Unnaboot  on distal foot and top of lower leg WOUND #6: - Lower Leg Wound Laterality: Right, Anterior Cleanser: Soap and Water 1 x Per Week/30 Days Discharge Instructions: May shower and wash wound with dial antibacterial soap and water prior to dressing change. Cleanser: Wound Cleanser 1 x Per Week/30 Days Discharge Instructions: Cleanse the wound with wound cleanser prior to applying a clean dressing using gauze sponges, not tissue or cotton balls. Peri-Wound Care: Sween Lotion (Moisturizing lotion) 1 x Per Week/30 Days Discharge Instructions: Apply moisturizing lotion as directed Prim Dressing: Maxorb Extra Ag+ Alginate Dressing, 2x2 (in/in) 1 x Per Week/30 Days ary Discharge Instructions: Apply to wound bed as instructed Secondary Dressing: ABD Pad, 8x10 1 x Per Week/30 Days Discharge Instructions: Apply over primary dressing as directed. Com pression Wrap: ThreePress (3 layer compression wrap) 1 x Per Week/30 Days Discharge Instructions: Apply three layer compression as directed. 07/15/2023: The left lower leg wound is healed. The medial right leg ulcers are significantly smaller since the last time I saw them. There is a wide band of healthy epithelialized tissue between them. The anterior right leg wound is quite a bit smaller, as well. The wound that occurred as a result of scissor injury several weeks ago has also healed. Edema control is good. I used a curette to debride slough and subcutaneous tissue from the 3 open sites. We will continue silver alginate with 3 layer compression. He continues on levofloxacin for management of Pseudomonas infection. Follow-up in 1 week. Electronic Signature(s) Signed: 07/15/2023 10:26:47 AM By: [name] Guess MD FACS Entered By: [name] on 07/15/2023 06:18:37 [name] ([id number]) [id number] Page 15 of  15 -------------------------------------------------------------------------------- SuperBill Details Patient Name: Date of Service: SRITHIK, QUIRIN 07/15/2023 Medical Record Number: [id number] Patient Account Number: [id number] Date of Birth/Sex: Treating RN: Mar 13, 1951 (72 y.o. M) Primary Care Provider: Nicoletta [name] Other Clinician: Referring Provider: Treating Provider/Extender: [name] in Treatment: 74 Diagnosis Coding ICD-10 Codes Code Description [phone] Non-pressure chronic ulcer of other part of right lower leg with fat layer exposed L97.822 Non-pressure chronic ulcer of other part of left lower leg with fat layer exposed 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.0 Lymphedema, not elsewhere classified I10 Essential (primary) hypertension I25.10 Atherosclerotic heart disease of native coronary artery without angina pectoris I87.2 Venous insufficiency (chronic) (peripheral) Z86.718 Personal history of other venous thrombosis and embolism Facility Procedures : CPT4 Code: [id number] Description: 11042 - DEB SUBQ TISSUE 20 SQ CM/< ICD-10 Diagnosis Description L97.812 Non-pressure chronic ulcer of other part of right lower leg with fat layer exp Modifier: osed Quantity: 1 Physician Procedures : CPT4 Code Description Modifier [id number] - WC PHYS LEVEL 4 - EST PT ICD-10 Diagnosis Description L97.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.0 Lymphedema, not elsewhere  classified I87.2 Venous insufficiency (chronic) (peripheral) Quantity: 1 : [id number] 11042 - WC PHYS SUBQ TISS 20 SQ CM ICD-10 Diagnosis Description L97.812 Non-pressure chronic ulcer of other part of right lower leg with fat layer exposed Quantity: 1 Electronic Signature(s) Signed: 07/15/2023 10:26:47 AM By: [name] Guess MD FACS Entered By: [name] on 07/15/2023 06:19:07

## 2023-07-25 ENCOUNTER — Other Ambulatory Visit: Payer: Self-pay | Admitting: *Deleted

## 2023-07-25 MED ORDER — EZETIMIBE 10 MG PO TABS: 10.0000 mg | ORAL_TABLET | Freq: Every day | ORAL | 3 refills | Status: DC

## 2023-07-29 ENCOUNTER — Encounter (HOSPITAL_BASED_OUTPATIENT_CLINIC_OR_DEPARTMENT_OTHER): Payer: Medicare Other | Attending: General Surgery | Admitting: General Surgery

## 2023-07-29 DIAGNOSIS — L942 Calcinosis cutis: Secondary | ICD-10-CM | POA: Diagnosis not present

## 2023-07-29 DIAGNOSIS — I890 Lymphedema, not elsewhere classified: Secondary | ICD-10-CM | POA: Diagnosis not present

## 2023-07-29 DIAGNOSIS — L97812 Non-pressure chronic ulcer of other part of right lower leg with fat layer exposed: Secondary | ICD-10-CM | POA: Insufficient documentation

## 2023-07-29 DIAGNOSIS — I2510 Atherosclerotic heart disease of native coronary artery without angina pectoris: Secondary | ICD-10-CM | POA: Diagnosis not present

## 2023-07-29 DIAGNOSIS — I872 Venous insufficiency (chronic) (peripheral): Secondary | ICD-10-CM | POA: Insufficient documentation

## 2023-07-29 DIAGNOSIS — Z86718 Personal history of other venous thrombosis and embolism: Secondary | ICD-10-CM | POA: Insufficient documentation

## 2023-07-29 NOTE — Progress Notes (Signed)
 [name], [name] ([id number]) [id number] Page 1 of 11 Visit Report for 07/29/2023 Arrival Information Details Patient Name: Date of Service: DEAIRE, [name] 07/29/2023 8:30 A M Medical Record Number: [id number] Patient Account Number: [id number] Date of Birth/Sex: Treating RN: 1951-04-20 (73 y.o. M) Primary Care Loye Vento: Candise Mungo Other Clinician: Referring Rekia Kujala: Treating Akaysha Cobern/Extender: Marolyn Delon Candise Mungo Devra in Treatment: 21 Visit Information History Since Last Visit Added or deleted any medications: No Patient Arrived: Wheel Chair Any new allergies or adverse reactions: No Arrival Time: 08:26 Had a fall or experienced change in No Accompanied By: self activities of daily living that may affect Transfer Assistance: None risk of falls: Patient Identification Verified: Yes Signs or symptoms of abuse/neglect since last visito No Secondary Verification Process Completed: Yes Hospitalized since last visit: No Patient Requires Transmission-Based Precautions: No Implantable device outside of the clinic excluding No Patient Has Alerts: Yes cellular tissue based products placed in the center Patient Alerts: R ABI= N/C, TBI= .75 since last visit: L ABI =N/C, TBI = .57 Pain Present Now: No Electronic Signature(s) Signed: 07/29/2023 9:43:08 AM By: Okey Bonier Entered By: Okey Bonier on 07/29/2023 08:27:03 -------------------------------------------------------------------------------- Compression Therapy Details Patient Name: Date of Service: [name], [name] 07/29/2023 8:30 A M Medical Record Number: [id number] Patient Account Number: [id number] Date of Birth/Sex: Treating RN: [birth year] (73 y.o. NETTY Merleen Handing Primary Care Simran Bomkamp: Candise Mungo Other Clinician: Referring Kamalei Roeder: Treating Suzzanne Brunkhorst/Extender: Marolyn Delon Candise Mungo Devra in Treatment: 25 Compression Therapy Performed for Wound Assessment: Wound #3  Right,Medial Lower Leg Performed By: Clinician Merleen Handing, RN Compression Type: Three Layer Post Procedure Diagnosis Same as Pre-procedure Electronic Signature(s) Signed: 07/29/2023 12:47:24 PM By: Merleen Handing RN, BSN Entered By: Boehlein, Linda on 07/29/2023 09:00:08 Encounter Discharge Information Details -------------------------------------------------------------------------------- [name] ([id number]) [id number] Page 2 of 11 Patient Name: Date of Service: [name] 07/29/2023 8:30 A M Medical Record Number: [id number] Patient Account Number: [id number] Date of Birth/Sex: Treating RN: 04-15-1951 (73 y.o. NETTY Claven Pollen Primary Care Genever Hentges: Candise Mungo Other Clinician: Referring Alexsandro Salek: Treating Reyah Streeter/Extender: Marolyn Delon Candise Mungo Devra in Treatment: 64 Encounter Discharge Information Items Post Procedure Vitals Discharge Condition: Stable Temperature (F): 97.7 Ambulatory Status: Wheelchair Pulse (bpm): 64 Discharge Destination: Home Respiratory Rate (breaths/min): 18 Transportation: Private Auto Blood Pressure (mmHg): 139/79 Accompanied By: self Schedule Follow-up Appointment: Yes Clinical Summary of Care: Patient Declined Electronic Signature(s) Signed: 07/29/2023 9:24:28 AM By: Claven Pollen RN Entered By: Claven Pollen on 07/29/2023 09:11:25 -------------------------------------------------------------------------------- Lower Extremity Assessment Details Patient Name: Date of Service: [name] 07/29/2023 8:30 A M Medical Record Number: [id number] Patient Account Number: [id number] Date of Birth/Sex: Treating RN: 1950/10/31 (73 y.o. NETTY Drury Nestle Primary Care Pixie Burgener: Candise Mungo Other Clinician: Referring Eydan Chianese: Treating Analeigh Aries/Extender: Marolyn Delon Candise Mungo Devra in Treatment: 76 Edema Assessment Assessed: Colletta: No] Glenis: Yes] Edema: [Left: Yes] [Right:  Yes] Calf Left: Right: Point of Measurement: 35 cm From Medial Instep 42 cm Ankle Left: Right: Point of Measurement: 13 cm From Medial Instep 27.5 cm Knee To Floor Left: Right: From Medial Instep 49 cm Vascular Assessment Pulses: Dorsalis Pedis Palpable: [Right:Yes] Extremity colors, hair growth, and conditions: Extremity Color: [Left:Hyperpigmented] [Right:Hyperpigmented] Hair Growth on Extremity: [Left:Yes] [Right:Yes] Temperature of Extremity: [Left:Warm] [Right:Warm] Capillary Refill: [Left:< 3 seconds] [Right:< 3 seconds] Dependent Rubor: [Left:No] [Right:No] Blanched when Elevated: [Left:Yes] [Right:No Yes] Toe Nail Assessment Left: Right: Thick: Yes DiscoloredMURRIEL, [name] ([id number]) [id number] Page 3 of 11 Deformed: Yes Improper Length and Hygiene: No Electronic Signature(s) Signed:  07/29/2023 9:24:28 AM By: Claven Pollen RN Signed: 07/29/2023 12:56:04 PM By: Drury Nestle RN, BSN Entered By: Claven Pollen on 07/29/2023 09:08:14 -------------------------------------------------------------------------------- Multi Wound Chart Details Patient Name: Date of Service: [name] 07/29/2023 8:30 A M Medical Record Number: [id number] Patient Account Number: [id number] Date of Birth/Sex: Treating RN: 05-13-1951 (73 y.o. M) Primary Care Mi Balla: Candise Mungo Other Clinician: Referring Damiana Berrian: Treating Landon Bassford/Extender: Marolyn Delon Candise Mungo Devra in Treatment: 38 Vital Signs Height(in): 71 Pulse(bpm): 64 Weight(lbs): 267 Blood Pressure(mmHg): 139/79 Body Mass Index(BMI): 37.2 Temperature(F): 97.7 Respiratory Rate(breaths/min): 18 [3:Photos:] [N/A:N/A] Right, Medial Lower Leg Right, Anterior Lower Leg N/A Wound Location: Gradually Appeared Gradually Appeared N/A Wounding Event: Lymphedema Calcinosis N/A Primary Etiology: Cataracts, Anemia, Lymphedema, Cataracts, Anemia, Lymphedema, N/A Comorbid  History: Congestive Heart Failure, Deep Vein Congestive Heart Failure, Deep Vein Thrombosis, Hypertension, Peripheral Thrombosis, Hypertension, Peripheral Venous Disease Venous Disease 12/07/2021 05/12/2022 N/A Date Acquired: 110 63 N/A Weeks of Treatment: Open Open N/A Wound Status: No No N/A Wound Recurrence: No Yes N/A Clustered Wound: 0.9x0.7x0.1 0.2x0.2x0.1 N/A Measurements L x W x D (cm) 0.495 0.031 N/A A (cm) : rea 0.049 0.003 N/A Volume (cm) : 90.70% 86.90% N/A % Reduction in Area: 97.70% 87.50% N/A % Reduction in Volume: Full Thickness With Exposed Support Full Thickness Without Exposed N/A Classification: Structures Support Structures Medium Small N/A Exudate Amount: Serous Serous N/A Exudate Type: media planner N/A Exudate Color: Distinct, outline attached Distinct, outline attached N/A Wound Margin: Small (1-33%) None Present (0%) N/A Granulation Amount: Red N/A N/A Granulation Quality: Large (67-100%) Large (67-100%) N/A Necrotic Amount: Fat Layer (Subcutaneous Tissue): Yes Fat Layer (Subcutaneous Tissue): Yes N/A Exposed Structures: Fascia: No Fascia: No Tendon: No Tendon: No Muscle: No Muscle: No Joint: No Joint: No Bone: No Bone: No Large (67-100%) Small (1-33%) N/A Epithelialization: Excoriation: No Excoriation: No N/A Periwound Skin Texture: Induration: No Induration: No EDDIE, PAYETTE ([id number]) [id number] Page 4 of 11 Callus: No Callus: No Crepitus: No Crepitus: No Rash: No Rash: No Scarring: No Scarring: No Maceration: No Maceration: No N/A Periwound Skin Moisture: Dry/Scaly: No Dry/Scaly: No Hemosiderin Staining: Yes Hemosiderin Staining: Yes N/A Periwound Skin Color: Atrophie Blanche: No Atrophie Blanche: No Cyanosis: No Cyanosis: No Ecchymosis: No Ecchymosis: No Erythema: No Erythema: No Mottled: No Mottled: No Pallor: No Pallor: No Rubor: No Rubor: No No Abnormality No  Abnormality N/A Temperature: Treatment Notes Electronic Signature(s) Signed: 07/29/2023 8:56:50 AM By: Marolyn Delon MD FACS Entered By: Marolyn Delon on 07/29/2023 08:56:50 -------------------------------------------------------------------------------- Multi-Disciplinary Care Plan Details Patient Name: Date of Service: [name] 07/29/2023 8:30 A M Medical Record Number: [id number] Patient Account Number: [id number] Date of Birth/Sex: Treating RN: 1951/07/09 (73 y.o. NETTY Claven Pollen Primary Care Priscila Bean: Candise Mungo Other Clinician: Referring Ermel Verne: Treating Arya Luttrull/Extender: Marolyn Delon Candise Mungo Devra in Treatment: 6 Multidisciplinary Care Plan reviewed with physician Active Inactive Venous Leg Ulcer Nursing Diagnoses: Knowledge deficit related to disease process and management Potential for venous Insuffiency (use before diagnosis confirmed) Goals: Patient will maintain optimal edema control Date Initiated: 02/17/2022 Target Resolution Date: 09/23/2023 Goal Status: Active Interventions: Assess peripheral edema status every visit. Compression as ordered Provide education on venous insufficiency Treatment Activities: Non-invasive vascular studies : 02/17/2022 Therapeutic compression applied : 02/17/2022 Notes: Wound/Skin Impairment Nursing Diagnoses: Impaired tissue integrity Knowledge deficit related to ulceration/compromised skin integrity Goals: Patient/caregiver will verbalize understanding of skin care regimen Date Initiated: 02/17/2022 Target Resolution Date: 09/23/2023 Goal Status: Active Ulcer/skin breakdown will have a volume reduction of 30% by week 4  ROEMELLO, SPEYER ([id number]) [id number] Page 5 of 11 Date Initiated: 02/17/2022 Date Inactivated: 03/10/2022 Target Resolution Date: 03/10/2022 Unmet Reason: infection being treated, Goal Status: Unmet waiting on derm appte Ulcer/skin breakdown will have a  volume reduction of 50% by week 8 Date Initiated: 03/10/2022 Date Inactivated: 04/14/2022 Target Resolution Date: 04/07/2022 Goal Status: Unmet Unmet Reason: calcinosis Ulcer/skin breakdown will have a volume reduction of 80% by week 12 Date Initiated: 04/14/2022 Date Inactivated: 05/05/2022 Target Resolution Date: 05/05/2022 Unmet Reason: infection, chronic Goal Status: Unmet condition Interventions: Assess patient/caregiver ability to obtain necessary supplies Assess patient/caregiver ability to perform ulcer/skin care regimen upon admission and as needed Assess ulceration(s) every visit Provide education on ulcer and skin care Treatment Activities: Skin care regimen initiated : 02/17/2022 Topical wound management initiated : 02/17/2022 Notes: Electronic Signature(s) Signed: 07/29/2023 9:24:28 AM By: Claven Pollen RN Entered By: Claven Pollen on 07/29/2023 08:56:24 -------------------------------------------------------------------------------- Pain Assessment Details Patient Name: Date of Service: [name] 07/29/2023 8:30 A M Medical Record Number: [id number] Patient Account Number: [id number] Date of Birth/Sex: Treating RN: 1950/08/04 (73 y.o. M) Primary Care Ayanah Snader: Candise Mungo Other Clinician: Referring Shakyia Bosso: Treating Hays Dunnigan/Extender: Marolyn Delon Candise Mungo Devra in Treatment: 62 Active Problems Location of Pain Severity and Description of Pain Patient Has Paino No Site Locations Pain Management and Medication Current Pain Management: Electronic Signature(s) Signed: 07/29/2023 9:43:08 AM By: Okey Bonier Entered By: Okey Bonier on 07/29/2023 08:27:35 [name] ([id number]) [id number] Page 6 of 11 -------------------------------------------------------------------------------- Patient/Caregiver Education Details Patient Name: Date of Service: JENSON, BEEDLE 1/3/2025andnbsp8:30 A M Medical Record Number:  [id number] Patient Account Number: [id number] Date of Birth/Gender: Treating RN: 1951-02-26 (73 y.o. NETTY Claven Pollen Primary Care Physician: Candise Mungo Other Clinician: Referring Physician: Treating Physician/Extender: Marolyn Delon Candise Mungo Devra in Treatment: 89 Education Assessment Education Provided To: Patient Education Topics Provided Wound/Skin Impairment: Methods: Explain/Verbal Responses: State content correctly [employer]) Signed: 07/29/2023 9:24:28 AM By: Claven Pollen RN Entered By: Claven Pollen on 07/29/2023 08:56:40 -------------------------------------------------------------------------------- Wound Assessment Details Patient Name: Date of Service: JERELLE, VIRDEN 07/29/2023 8:30 A M Medical Record Number: [id number] Patient Account Number: [id number] Date of Birth/Sex: Treating RN: 05/22/1951 (73 y.o. NETTY Merleen Handing Primary Care Demetry Bendickson: Candise Mungo Other Clinician: Referring Kendall Arnell: Treating Domingo Fuson/Extender: Marolyn Delon Candise Mungo Devra in Treatment: 76 Wound Status Wound Number: 13 Primary Calcinosis Etiology: Wound Location: Right, Medial, Posterior Lower Leg Secondary Lymphedema Wounding Event: Gradually Appeared Etiology: Date Acquired: 07/29/2023 Wound Open Weeks Of Treatment: 0 Status: Clustered Wound: No Comorbid Cataracts, Anemia, Lymphedema, Congestive Heart Failure, Deep History: Vein Thrombosis, Hypertension, Peripheral Venous Disease Wound Measurements Length: (cm) 0.5 Width: (cm) 0.7 Depth: (cm) 0.1 Area: (cm) 0.275 Volume: (cm) 0.027 % Reduction in Area: % Reduction in Volume: Epithelialization: Small (1-33%) Tunneling: No Undermining: No Wound Description Classification: Full Thickness Without Exposed Support Structures Wound Margin: Distinct, outline attached Exudate Amount: Medium Exudate Type: Serosanguineous Exudate Color: red, brown Michna, Sylas ([id number]) Wound  Bed Granulation Amount: None Present (0%) Necrotic Amount: Large (67-100%) Necrotic Quality: Adherent Slough Foul Odor After Cleansing: No Slough/Fibrino Yes [id number] Page 7 of 11 Exposed Structure Fascia Exposed: No Fat Layer (Subcutaneous Tissue) Exposed: Yes Tendon Exposed: No Muscle Exposed: No Joint Exposed: No Bone Exposed: No Periwound Skin Texture Texture Color No Abnormalities Noted: Yes No Abnormalities Noted: No Hemosiderin Staining: Yes Moisture No Abnormalities Noted: Yes Temperature / Pain Temperature: No Abnormality Treatment Notes Wound #13 (Lower Leg) Wound Laterality: Right, Medial, Posterior Cleanser Soap and Water Discharge Instruction: May shower  and wash wound with dial antibacterial soap and water prior to dressing change. Wound Cleanser Discharge Instruction: Cleanse the wound with wound cleanser prior to applying a clean dressing using gauze sponges, not tissue or cotton balls. Peri-Wound Care Skin Prep Discharge Instruction: cavelon to macerated wound margins Sween Lotion (Moisturizing lotion) Discharge Instruction: Apply moisturizing lotion as directed Topical Primary Dressing Maxorb Extra Ag+ Alginate Dressing, 4x4.75 (in/in) Discharge Instruction: Apply to excoriated weeping areas Secondary Dressing ABD Pad, 8x10 Discharge Instruction: Apply over primary dressing as directed. Secured With Compression Wrap ThreePress (3 layer compression wrap) Discharge Instruction: Apply three layer compression as directed. Unnaboot w/Calamine, 4x10 (in/yd) Discharge Instruction: Apply Unnaboot on distal foot and top of lower leg Compression Stockings Add-Ons Electronic Signature(s) Signed: 07/29/2023 12:47:24 PM By: Boehlein, Linda RN, BSN Entered By: Boehlein, Linda on 07/29/2023 08:58:03 -------------------------------------------------------------------------------- Wound Assessment Details Patient Name: Date of  Service: [name] 07/29/2023 8:30 A M Medical Record Number: [id number] Patient Account Number: [id number] Date of Birth/Sex: Treating RN: 10/27/1950 (73 y.o. NETTY Merleen Handing Primary Care Aniqa Hare: Candise Mungo Other Clinician: Referring Cortni Tays: Treating Pate Aylward/Extender: Marolyn Delon Candise Mungo Devra in Treatment: [address], [place] ([id number]) [id number] Page 8 of 11 Wound Status Wound Number: 3 Primary Lymphedema Etiology: Wound Location: Right, Medial Lower Leg Wound Open Wounding Event: Gradually Appeared Status: Date Acquired: 12/07/2021 Comorbid Cataracts, Anemia, Lymphedema, Congestive Heart Failure, Deep Weeks Of Treatment: 76 History: Vein Thrombosis, Hypertension, Peripheral Venous Disease Clustered Wound: No Photos Wound Measurements Length: (cm) 0.9 Width: (cm) 0.7 Depth: (cm) 0.1 Area: (cm) 0.495 Volume: (cm) 0.049 % Reduction in Area: 90.7% % Reduction in Volume: 97.7% Epithelialization: Large (67-100%) Tunneling: No Undermining: No Wound Description Classification: Full Thickness With Exposed Support Structures Wound Margin: Distinct, outline attached Exudate Amount: Medium Exudate Type: Serous Exudate Color: amber Foul Odor After Cleansing: No Slough/Fibrino Yes Wound Bed Granulation Amount: Small (1-33%) Exposed Structure Granulation Quality: Red Fascia Exposed: No Necrotic Amount: Large (67-100%) Fat Layer (Subcutaneous Tissue) Exposed: Yes Tendon Exposed: No Muscle Exposed: No Joint Exposed: No Bone Exposed: No Periwound Skin Texture Texture Color No Abnormalities Noted: No No Abnormalities Noted: No Callus: No Atrophie Blanche: No Crepitus: No Cyanosis: No Excoriation: No Ecchymosis: No Induration: No Erythema: No Rash: No Hemosiderin Staining: Yes Scarring: No Mottled: No Pallor: No Moisture Rubor: No No Abnormalities Noted: No Dry / Scaly: No Temperature / Pain Maceration:  No Temperature: No Abnormality Treatment Notes Wound #3 (Lower Leg) Wound Laterality: Right, Medial Cleanser Soap and Water Discharge Instruction: May shower and wash wound with dial antibacterial soap and water prior to dressing change. Wound Cleanser Discharge Instruction: Cleanse the wound with wound cleanser prior to applying a clean dressing using gauze sponges, not tissue or cotton balls. Peri-Wound Care Skin Prep Discharge Instruction: cavelon to macerated wound margins YAMIL, OELKE ([id number]) [id number] Page 9 of 11 Sween Lotion (Moisturizing lotion) Discharge Instruction: Apply moisturizing lotion as directed Topical Primary Dressing Maxorb Extra Ag+ Alginate Dressing, 4x4.75 (in/in) Discharge Instruction: Apply to excoriated weeping areas Secondary Dressing ABD Pad, 8x10 Discharge Instruction: Apply over primary dressing as directed. Secured With Compression Wrap ThreePress (3 layer compression wrap) Discharge Instruction: Apply three layer compression as directed. Unnaboot w/Calamine, 4x10 (in/yd) Discharge Instruction: Apply Unnaboot on distal foot and top of lower leg Compression Stockings Add-Ons Electronic Signature(s) Signed: 07/29/2023 12:47:24 PM By: Boehlein, Linda RN, BSN Entered By: Boehlein, Linda on 07/29/2023 08:55:56 -------------------------------------------------------------------------------- Wound Assessment Details Patient Name: Date of Service: [name] 07/29/2023 8:30 A M Medical  Record Number: [id number] Patient Account Number: [id number] Date of Birth/Sex: Treating RN: January 05, 1951 (73 y.o. M) Primary Care Secily Walthour: Candise Mungo Other Clinician: Referring Deshan Hemmelgarn: Treating Xaiver Roskelley/Extender: Marolyn Delon Candise Mungo Devra in Treatment: 76 Wound Status Wound Number: 6 Primary Calcinosis Etiology: Wound Location: Right, Anterior Lower Leg Wound Open Wounding Event: Gradually Appeared Status: Date  Acquired: 05/12/2022 Comorbid Cataracts, Anemia, Lymphedema, Congestive Heart Failure, Deep Weeks Of Treatment: 63 History: Vein Thrombosis, Hypertension, Peripheral Venous Disease Clustered Wound: Yes Photos Wound Measurements Length: (cm) 0.2 Width: (cm) 0.2 Depth: (cm) 0.1 Area: (cm) 0.031 Volume: (cm) 0.003 Boden, Dastan ([id number]) % Reduction in Area: 86.9% % Reduction in Volume: 87.5% Epithelialization: Small (1-33%) Tunneling: No Undermining: No [id number] Page 10 of 11 Wound Description Classification: Full Thickness Without Exposed Suppor Wound Margin: Distinct, outline attached Exudate Amount: Small Exudate Type: Serous Exudate Color: amber t Structures Wound Bed Granulation Amount: None Present (0%) Exposed Structure Necrotic Amount: Large (67-100%) Fascia Exposed: No Necrotic Quality: Adherent Slough Fat Layer (Subcutaneous Tissue) Exposed: Yes Tendon Exposed: No Muscle Exposed: No Joint Exposed: No Bone Exposed: No Periwound Skin Texture Texture Color No Abnormalities Noted: No No Abnormalities Noted: No Callus: No Atrophie Blanche: No Crepitus: No Cyanosis: No Excoriation: No Ecchymosis: No Induration: No Erythema: No Rash: No Hemosiderin Staining: Yes Scarring: No Mottled: No Pallor: No Moisture Rubor: No No Abnormalities Noted: No Dry / Scaly: No Temperature / Pain Maceration: No Temperature: No Abnormality Treatment Notes Wound #6 (Lower Leg) Wound Laterality: Right, Anterior Cleanser Soap and Water Discharge Instruction: May shower and wash wound with dial antibacterial soap and water prior to dressing change. Wound Cleanser Discharge Instruction: Cleanse the wound with wound cleanser prior to applying a clean dressing using gauze sponges, not tissue or cotton balls. Peri-Wound Care Sween Lotion (Moisturizing lotion) Discharge Instruction: Apply moisturizing lotion as directed Topical Primary  Dressing Maxorb Extra Ag+ Alginate Dressing, 2x2 (in/in) Discharge Instruction: Apply to wound bed as instructed Secondary Dressing ABD Pad, 8x10 Discharge Instruction: Apply over primary dressing as directed. Secured With Compression Wrap ThreePress (3 layer compression wrap) Discharge Instruction: Apply three layer compression as directed. Compression Stockings Add-Ons Electronic Signature(s) Signed: 07/29/2023 12:56:04 PM By: Drury Nestle RN, BSN Entered By: Drury Nestle on 07/29/2023 08:36:30 [name] ([id number]) [id number] Page 11 of 11 -------------------------------------------------------------------------------- Vitals Details Patient Name: Date of Service: MICHAEAL, DAVIS 07/29/2023 8:30 A M Medical Record Number: [id number] Patient Account Number: [id number] Date of Birth/Sex: Treating RN: [birth year] (73 y.o. M) Primary Care Chamia Schmutz: Candise Mungo Other Clinician: Referring Keatin Benham: Treating Nashonda Limberg/Extender: Marolyn Delon Candise Mungo Devra in Treatment: 76 Vital Signs Time Taken: 08:27 Temperature (F): 97.7 Height (in): 71 Pulse (bpm): 64 Weight (lbs): 267 Respiratory Rate (breaths/min): 18 Body Mass Index (BMI): 37.2 Blood Pressure (mmHg): 139/79 Reference Range: 80 - 120 mg / dl Electronic Signature(s) Signed: 07/29/2023 9:43:08 AM By: Okey Bonier Entered By: Okey Bonier on 07/29/2023 08:27:29

## 2023-07-29 NOTE — Progress Notes (Addendum)
 [name] ([id number]) [id number] Page 1 of 16 Visit Report for 07/29/2023 Chief Complaint Document Details Patient Name: Date of Service: [name], [name] 07/29/2023 8:30 A M Medical Record Number: [id number] Patient Account Number: [id number] Date of Birth/Sex: Treating RN: 1950-07-27 (73 y.o. M) Primary Care Provider: Candise [name] Other Clinician: Referring Provider: Treating Provider/Extender: [name] [name] [name]: 73 Information Obtained from: Patient Chief Complaint Patient seen for complaints of Non-Healing Wounds. Electronic Signature(s) Signed: 07/29/2023 8:57:05 AM By: [name] Delon MD FACS Entered By: [name] on 07/29/2023 05:57:05 -------------------------------------------------------------------------------- Debridement Details Patient Name: Date of Service: [name] 07/29/2023 8:30 A M Medical Record Number: [id number] Patient Account Number: [id number] Date of Birth/Sex: Treating RN: 17-Mar-1951 (73 y.o. [name] Primary Care Provider: Candise [name] Other Clinician: Referring Provider: Treating Provider/Extender: [name] [name] [name]: 73 Debridement Performed for Assessment: Wound #3 Right,Medial Lower Leg Performed By: Physician [name] Delon, MD The following information was scribed by: [name] The information was scribed for: [name] Debridement Type: Debridement Level of Consciousness (Pre-procedure): Awake and Alert Pre-procedure Verification/Time Out Yes - 08:50 Taken: Start Time: 08:51 Pain Control: Lidocaine  4% T opical Solution Percent of Wound Bed Debrided: 100% T Area Debrided (cm): otal 0.49 Tissue and other material debrided: Viable, Non-Viable, Slough, Subcutaneous, Slough Level: Skin/Subcutaneous Tissue Debridement Description: Excisional Instrument: Curette Bleeding: Minimum Hemostasis Achieved:  Pressure Procedural Pain: 0 Post Procedural Pain: 0 Response to [name]: Procedure was tolerated well Level of Consciousness (Post- Awake and Alert procedure): Post Debridement Measurements of Total Wound Length: (cm) 0.9 Width: (cm) 0.7 Depth: (cm) 0.1 Volume: (cm) 0.049 Character of Wound/Ulcer Post Debridement: Improved [name] ([id number]) [id number] Page 2 of 16 Post Procedure Diagnosis Same as Pre-procedure Electronic Signature(s) Signed: 07/29/2023 12:14:34 PM By: [name] Delon MD FACS Signed: 07/29/2023 12:47:24 PM By: [name] Handing RN, BSN Entered By: [name] on 07/29/2023 05:59:08 -------------------------------------------------------------------------------- Debridement Details Patient Name: Date of Service: [name], [name] 07/29/2023 8:30 A M Medical Record Number: [id number] Patient Account Number: [id number] Date of Birth/Sex: Treating RN: Oct 25, 1950 (73 y.o. [name] Primary Care Provider: Candise [name] Other Clinician: Referring Provider: Treating Provider/Extender: [name] [name] [name]: 73 Debridement Performed for Assessment: Wound #13 Right,Medial,Posterior Lower Leg Performed By: Physician [name] Delon, MD The following information was scribed by: [name] The information was scribed for: [name] Debridement Type: Debridement Level of Consciousness (Pre-procedure): Awake and Alert Pre-procedure Verification/Time Out Yes - 08:50 Taken: Start Time: 08:51 Pain Control: Lidocaine  4% T opical Solution Percent of Wound Bed Debrided: 100% T Area Debrided (cm): otal 0.27 Tissue and other material debrided: Viable, Non-Viable, Slough, Subcutaneous, Slough Level: Skin/Subcutaneous Tissue Debridement Description: Excisional Instrument: Curette Bleeding: Minimum Hemostasis Achieved: Pressure Procedural Pain: 0 Post Procedural Pain: 0 Response to [name]:  Procedure was tolerated well Level of Consciousness (Post- Awake and Alert procedure): Post Debridement Measurements of Total Wound Length: (cm) 0.5 Width: (cm) 0.7 Depth: (cm) 0.1 Volume: (cm) 0.027 Character of Wound/Ulcer Post Debridement: Improved Post Procedure Diagnosis Same as Pre-procedure Electronic Signature(s) Signed: 07/29/2023 12:14:34 PM By: [name] Delon MD FACS Signed: 07/29/2023 12:47:24 PM By: [name] Handing RN, BSN Entered By: [name] on 07/29/2023 05:59:35 HPI Details -------------------------------------------------------------------------------- [name] ([id number]) [id number] Page 3 of 16 Patient Name: Date of Service: [name], [name] 07/29/2023 8:30 A M Medical Record Number: [id number] Patient Account Number: [id number] Date of Birth/Sex: Treating RN: 1950-12-29 (73 y.o. M) Primary Care Provider: Candise [name] Other Clinician:  Referring Provider: Treating Provider/Extender: [name] in [name]: 17 History of Present Illness HPI Description: ADMISSION 02/09/2022 This is a 73 year old man who recently moved to [place]  from [place] . He has a history of of coronary artery disease and prior left popliteal vein DVT . He is not diabetic and does not smoke. 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 He has had multiple spinal surgeries and has limited mobility. He primarily uses a motorized wheelchair. He has had lymphedema for many years and does have lymphedema pumps although he says he does not use them frequently. He has wounds on his bilateral lower extremities. He was receiving care in [place]  for these prior to his move. They were apparently packing his wounds with Betadine soaked gauze. He has worn compression wraps in the past but not recently. He did say that they helped tremendously. In clinic today, his right ABI is noncompressible but has good signals; the left ABI  was 1.17. No formal vascular studies have been performed. On his left dorsal foot, there is a circular lesion that exposes the fat layer. There is fairly heavy slough accumulation within the wound, but no significant odor. On his right medial calf, he has multiple pinholes that have slough buildup in them and probe for about 2 to 4 mm in depth. They are draining clear fluid. On his right lateral midfoot, he has ulceration consistent with venous stasis. It is geographic and has slough accumulation. 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 02/17/2022: All of the wounds are little bit larger today and have more slough accumulation. Today, the medial calf openings are larger and probe to something that feels calcified. There is odor coming from his wounds. His vascular studies are scheduled for August 9 and 10. 02-24-2022 upon evaluation today patient presents for follow-up concerning his ongoing issues here with his lower extremities. With that being said he does have significant problems with what appears to be cellulitis unfortunately the PCR culture that was sent last week got crushed by UPS in transit. With that being said we are going to reobtain a culture today although I am actually to do it on the right medial leg as this seems to be the most inflamed area today where there is some questionable drainage as well. I am going to remove some of the calcium  deposits which are loosening obtain a deeper culture from this area. Fortunately I do not see any evidence of active infection systemically which is good news but locally I think we are having a bigger issue here. He does have his appointment next Thursday with vascular. Patient has also been referred to [facility] for further evaluation and [name] of the calcinosis for possible sulfate injections. 03/04/2022: The culture that was performed last week grew  out multiple species including Klebsiella, Morganella, and Pseudomonas aeruginosa. He had been prescribed Bactrim but this was not adequate to cover all of the species and levofloxacin  was recommended. He completed the Bactrim but did not pick up the levofloxacin  and begin taking it until yesterday. 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 He had arterial studies performed today. He does have evidence of peripheral arterial disease. Results copied here: +-------+-----------+-----------+------------+------------+ ABI/TBIT oday's ABIT oday's TBIPrevious ABIPrevious TBI +-------+-----------+-----------+------------+------------+ Right [place]  0.75    +-------+-----------+-----------+------------+------------+ Left [place] 0.57    +-------+-----------+-----------+------------+------------+ Arterial wall calcification precludes accurate ankle pressures and ABIs. Summary: Right: Resting right ankle-brachial index indicates noncompressible right lower extremity arteries. The right toe-brachial index is normal. Left: Resting left ankle-brachial index indicates noncompressible left lower extremity arteries. The left toe-brachial index is abnormal. He continues to have further tissue breakdown at the right medial calf and there is ample slough accumulation along with necrotic subcutaneous tissue. The left dorsal foot wound has built up slough, as well. The right medial foot wound is nearly closed with just a light layer of eschar over the surface. The right dorsal lateral foot wound has also accumulated a fair amount of slough and remains quite tender. 03/10/2022: He has been taking the prescribed levofloxacin  and has about 3 days left of this. The erythema and induration on his right leg has improved. He continues to experience  further breakdown of the right medial calf wound. There is extensive slough and debris accumulation there. There is heavy slough on his left dorsal foot wound, but no odor or purulent drainage. The right medial foot wound appears to be closed. The right dorsal lateral foot wound also has a fair amount of slough but it is less tender than at our last visit. He still does not have an appointment with dermatology. 03/22/2022: The right anterior tibial wound has closed. The right lateral foot wound is nearly closed with just a little bit of slough. The left dorsal foot wound is smaller and continues to have some slough buildup but less so than at prior visits. There is good granulation tissue underlying the slough. Unfortunately the right medial calf wound continues to deteriorate. It has a foul odor today and a lot of necrotic tissue, the surrounding skin is also erythematous and moist. 03/30/2022: The right lateral foot wound continues to contract and just has a little bit of slough and eschar present. The left dorsal foot wound is also smaller with slough overlying good granulation tissue. The right medial calf wound actually looks quite a bit better today; there is no odor and there is much less necrotic tissue although some remains present. We have been using topical gentamicin and he has been taking oral Augmentin. 04/07/2022: The patient saw dermatology at [facility] last week. Unfortunately, it appears that they did not read any of the documentation that was provided. 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 Overall, it was a highly unsatisfactory consultation. In the meantime, however, we were able to identify compounding pharmacy who  could create a topical sodium thiosulfate ointment. The patient has that with him today. Both of the right foot wounds are now closed. The left dorsal foot wound has contracted but still has a layer of slough on the surface. The medial calf wounds on the right all look a little bit better. They still have heavy slough along with the chunks of calcium . Everything is stained purple. 04/14/2022: The wound on his dorsal left foot is a little bit smaller again today. There is still slough accumulation on the surface. The medial calf wounds have quite a bit less slough accumulation today. His right leg, however, is warm and erythematous, concerning for cellulitis. [name], [name] ([id number]) [id number] Page 4 of  16 04/14/2022: He completed his course of Augmentin yesterday. The medial calf wound sites are much cleaner today and shallower. There is less fragmented calcium  in the wounds. He has a lot of lymphedema fluid-like drainage from these wounds, but no purulent discharge or malodor. The wound on his dorsal left foot has also contracted and is shallower. There is a layer of slough over healthy granulation tissue. 05/05/2022: The left dorsal foot wound is smaller today with less slough and more granulation tissue. The right medial calf wounds are shallower, but have extensive slough and some fat necrosis. The drainage has diminished considerably, however. 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 05/12/2022: The left dorsal foot wound continues to contract and fill with granulation tissue. There is slough on the wound surface. The right medial calf wounds also continue to fill with healthy tissue, but there is remaining slough and fat necrosis present. He had a significant increase in his drainage this week and it was a bright yellow-green color. He also had a new  wound open over his right anterior tibial surface associated with the spike of cutaneous calcium . The leg is more red, as well, concerning for infection. 05/19/2022: His culture returned positive for Pseudomonas. He is currently taking a course of oral levofloxacin . We have also been using topical gentamicin mixed in with his sodium thiosulfate. All of the wounds look better this week. 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 There is slough and nonviable subcutaneous tissue still present. The new anterior tibial wound just has a light layer of slough. The dorsal foot wound also has some slough present and appears a little dry today. 05/26/2022: The right medial leg wound continues to improve. It is feeling with granulation tissue, but still accumulates a fairly thick layer of slough on the surface. The more recent anterior tibial wound has remained quite small, but also has some slough on the surface. The left dorsal foot wound has contracted somewhat and has perimeter epithelialization. He completed his oral levofloxacin . 06/02/2022: The left dorsal foot wound continues to improve significantly. There is just a little slough on the wound surface. The right medial leg wound had black drainage, but it looks like silver alginate was applied last week and I theorized that silver sulfide was created with a combination of the silver alginate and the sodium thiosulfate. It did, however, have a bit of an odor to it and extensive slough accumulation. The small anterior tibial wound also had a bit of slough and nonviable subcutaneous tissue present. The skin of his leg was rather macerated, as well. 06/09/2022: The left dorsal foot wound is smaller again this week with just a light layer of slough on the surface. The right medial leg wound looks substantially better. The leg and periwound skin are no longer red and macerated. There is good granulation tissue forming with less  slough and nonviable tissue. The anterior tibial wound just has a small amount of slough accumulation. 06/16/2022: The left dorsal foot wound continues to contract. His wrap slipped a little bit, however, and his foot is more swollen. The right medial leg wound continues to improve. The underlying tissue is more vital-appearing and there is less slough. He does have substantial drainage. The small anterior tibial wound has a bit of slough accumulation. 06/23/2022: The left dorsal foot wound is about half the size as  it was last week. There is just some light slough on the surface and some eschar buildup around the edges. The right anterior leg wound is small with a little slough on the surface. Unfortunately, the right medial leg wound deteriorated fairly substantially. It is malodorous with gray/green/black discoloration. 06/30/2022: The left dorsal foot wound continues to contract. It is nearly closed with just a light layer of slough present. The right anterior leg wound is about the same size and has a small amount of slough on the surface. The right medial leg wound looks a lot better than it did last week. The odor has abated and the tissue is less pulpy and necrotic-looking. There is still a fairly thick layer of slough present. 12/13; the left dorsal foot wound looks very healthy. There was no need to change the dressing here and no debridement. He has a small area on the right anterior lower leg apparently had not changed much in size. The area on the medial leg is the most problematic area this is large with some depth and a completely nonviable surface today. 07/14/2022: The left dorsal foot wound is smaller today with just a little slough and eschar present. The right anterior lower leg wound is about the same without any significant change. The medial leg wound has a black and green surface and is malodorous. No purulent drainage and the periwound skin is intact. Edema control is  suboptimal. 07/21/2022: The dorsal foot wound continues to contract and is nearly closed with just a little bit of slough and eschar on the surface. The right anterior lower leg wound is shallower today but otherwise the dimensions are unchanged. The medial leg wound looks significantly better although it still has a nonviable surface; the odor and black/green surface has resolved. His culture came back with multiple species sensitive to Augmentin, which was prescribed and a very low level of Pseudomonas, which should be handled by the topical gentamicin we have been using. 07/28/2022: The dorsal foot wound is down to just a very tiny opening with a little eschar on the surface. The right anterior tibial wound is about the same with some slough buildup. The right medial leg wound looks quite a bit better this week. There is thick rubbery slough on the surface, but the odor and drainage has improved significantly. 08/04/2022: The dorsal foot wound is almost healed, but the periwound skin has broken down. This appears to be secondary to moisture and adhesive from the absorbent pad. The right anterior tibial wound is shallower. The right medial leg wound continues to improve. It is smaller, still with rubbery slough on the surface. No malodor or purulent drainage. 08/11/2022: The dorsal foot wound is about the same size. It is a little bit dry with some slough accumulation. The right anterior tibial wound is also unchanged. The right medial leg wound is filling in further with good granulation tissue. Still with some slough buildup on the surface. 08/18/2022: His left leg is bright red, hot, and swollen with cellulitis. The dorsal foot wound actually looks quite good and is superficial with just a little bit of slough accumulation. The right anterior tibial wound is unchanged. The right medial leg wound has filled in with good granulation tissue but has copious amounts of drainage. 08/25/2022: His cellulitis  has responded nicely to the oral antibiotics. His dorsal foot wound is smaller but has a fair amount of slough buildup. The right anterior tibial wound remains stable and unchanged. The right medial leg  wound has more granulation tissue under a layer of slough, but continues to have copious drainage. The drainage is actually causing skin irritation and threatens to result in breakdown. 09/01/2022: He continues to have profuse drainage from his wounds which is resulting in tissue maceration on both the dorsum of his left foot and the periwound distal to his medial leg wound. The dorsal foot wound is nearly healed, despite this. The medial right leg wound is filling in with good granulation tissue. We are working on getting home health assistance to change his dressings more frequently to try and control the drainage better. 09/08/2022: We have had success in controlling his drainage. The tissue maceration has improved significantly and his swelling has come down quite markedly. The medial right leg wound is much more superficial and has substantially less nonviable tissue present. The anterior tibial wound is a little bit larger, however with some nonviable tissue present. 09/15/2022: The dorsal foot wound is nearly closed. The anterior tibial wound measures slightly larger today. The medial right leg wound continues to improve quite dramatically with less nonviable tissue and more robust-looking granulation. Edema control is significantly better. 09/22/2022: The left dorsal foot wound remains open, albeit quite tiny. The anterior tibial wound is a little bit larger again today. The medial right leg wound continues to improve with an increase in robust and healthy granulation tissue and less accumulation of nonviable tissue. 09/29/2022: His home health providers wrapped his left leg extremely tightly and he ended up having to cut the wrap off of his foot. This resulted in his foot being very swollen and that  the dorsal foot wound is a little bit larger today. The anterior tibial wound is about the same size with a little slough on the surface. The medial right leg wound continues to fill in. There are very few areas with any significant depth. There is still slough accumulation. [name], [name] ([id number]) [id number] Page 5 of 16 3/13; Patient has a left dorsal foot wound along with a right anterior tibial wound and right medial wound. We are using silver alginate to the left dorsal foot wound, Santyl and sodium thiosulfate compound ointment to the right lower extremity wounds under 4 layer compression. Patient has no issues or complaints today. 10/13/2022: The left dorsal foot wound is a little bit bigger today, but his foot is also a little bit more swollen. The right medial leg wound has filled in more and has substantially less depth. There is still some nonviable tissue present. The small anterior lower leg wound looks about the same. 10/20/2022: The left dorsal foot wound is epithelializing. There does not appear to be much open at this site. There is some eschar around the edges. The right medial leg wound continues to fill in with granulation tissue. The small anterior lower leg wound is a little bit bigger and there is a chunk of calcium  protruding. He continues to have fairly copious drainage from the right leg. 10/27/2022: His home health agency failed to show up at all this past week and as a result, the excessive drainage from his right leg wound has resulted in tissue breakdown. His leg is red and irritated. The wounds themselves look about the same. The wound on his left dorsal foot is nearly closed underneath some eschar. 11/03/2022: We continue to have difficulty with his home health agency. He continues to have significant drainage from his right leg wound which has resulted in even further tissue breakdown. He also was not taking  his Lasix  as prescribed and I think  much of the drainage has been his lymphedema fluid choosing the path of least resistance. The wound on his left dorsal foot is closed, but in the process of cleaning the site, dry skin was pulled off and he has a new small superficial wound just adjacent to where the dorsal foot wound was. 11/12/2022: His left dorsal foot is completely healed. The right medial leg continues to deteriorate. He is not taking his Lasix  as prescribed, nor is he using his lymphedema pumps at all. As a result, he has massive amounts of drainage saturating his dressings and causing further tissue breakdown on his medial leg. Today, his dressings had a foul odor, as well. 11/19/2022: His right medial leg looks even worse today. There is more erythema and moisture related tissue breakdown. The 2 anterior wounds look a little bit better. The culture that I took last week did produce Pseudomonas and he is currently taking levofloxacin  for this. 11/26/2022: There has been significant improvement to his right medial leg. It is far less raw and many of the superficial open areas have epithelialized. He continues to cut the foot portion of his wrap and as result his foot is bulbous and swollen compared to the rest of his leg, where edema control is good. The smaller of the 2 anterior tibial wound is closed, while the larger is contracting somewhat with a bit of slough at the base. 12/03/2022: His leg continues to demonstrate improvement. There has been epithelialization of much of the denuded area. He has a new small anterior tibial wound that has opened around a nidus of calcium . Edema control is improved. 12/10/2022: Continued improvement in the periwound skin. The anterior tibial wounds are about the same, but the medial leg wound has improved further. There is less nonviable tissue present. 12/17/2022: Between his visit on Tuesday with the nurse and today, he has had significantly more drainage which has resulted in some periwound  tissue maceration. I suspect this correlates somewhat with his diuretic use, as he says he takes his diuretic religiously on Saturday, Sunday, and Monday and then does not take it after Tuesday due to being out and about and not being close to easy restroom access. I suspect the excess fluid is just simply draining out of his leg. 12/21/2022: This was supposed to be a nurse visit, but his leg has broken down so significantly, that we have converted to a wound care visit. He did take his diuretics over the weekend, but despite this, he has had copious drainage and all of his tissues have broken down substantially from the right medial leg ulcer all the way down to his ankle and even wrapping posterior to this. There is blue-green staining on his dressing, suggesting ongoing presence of Pseudomonas aeruginosa. 12/24/2022: The wound already looks better than it did 2 days ago. He has been taking ciprofloxacin  and the Urgo clean dressing we applied did a much better job of wicking the drainage away from his leg. There is slough on the entire surface of the medial leg wound, but the erythema and induration has improved in the past couple of days. 12/31/2022: Continued improvement of the wound. He says that the pain has basically abated since being on ciprofloxacin . Still with fairly heavy drainage and slough accumulation. 01/07/2023: Overall continued improvement. Still with heavy slough buildup, but less drainage. 01/14/2023: There is a little bit of improvement. The skin is still looking a bit excoriated. He has a  few more days left of ciprofloxacin . 01/21/2023: There has been significant improvement. The periwound skin is in much better condition. The ulcers are shallower and actually have some good granulation tissue present under some slough. 01/28/2023: The wound continues to improve. All of the open areas measured smaller today. There is slough accumulation on the surfaces. His drainage has decreased  markedly. 02/04/2023: Continued improvement in the wound. Drainage remains significantly diminished. Slough accumulation is present but to a lesser extent. Periwound skin continues to improve. Edema control is good. 02/18/2023: The heavy drainage persists, although it is was not as significant today as it was at his nurse visit on Tuesday. The drainage is yellow and does not have the blue-green discoloration suggestive of Pseudomonas. The actual wounds on his leg are getting smaller, but the periwound skin is quite excoriated. 02/25/2023: His periwound skin is quite excoriated and he has a lot of clear yellow drainage. The main wounds are filling in further and epithelializing. 03/04/2023: The excoriation of the periwound skin has improved. Most of that area has dried up. The main wounds have less slough in them today. 03/11/2023: The main wounds have accumulated a little bit of slough. The periwound excoriation continues to improve. Unfortunately, he suffered a fall over the previous weekend and has abrasions on his knees bilaterally and on his right wrist and upper arm. In addition, the skin on his left lower leg apparently blistered and opened, leaving several superficial wounds on this extremity. He has been approved for the [facility] and Press wound VAC and has the equipment with him today. 03/18/2023: He has extensive excoriation to the periwound skin on the right medial lower leg. The actual wounds themselves are smaller with slough accumulation. The top of his left foot has begun weeping again. Using the [facility] and Press wound VAC, he says that he through 4 canisters. He seems to be very fluid overloaded, without any significant dyspnea. 03/25/2023: The wound to his left anterior tibial surface has healed. The left dorsal foot wound has gotten smaller and has a small area of hypertrophic granulation tissue present. The periwound skin on the right medial lower leg looks slightly better, but still appears  excoriated. The actual wounds continue to contract but are still accumulating quite a bit of slough. The tiny right anterior leg wounds are contracting. 04/01/2023: The left dorsal foot wound got a little bit bigger this week. There is slough accumulation. The periwound skin on the right medial lower leg continues to improve. The main medial right lower leg wounds have a dark gray discoloration and there is a strong odor coming from them. The back of his right lower leg just above the ankle also has a little bit of new breakdown where he is draining edema fluid. The small anterior leg wounds are stable. [name], [name] ([id number]) [id number] Page 6 of 16 04/08/2023: Since starting antibiotics last week, there has been tremendous improvement. The left dorsal foot wound is nearly closed with just a little bit of eschar on the surface. The right medial lower leg is less excoriated and red and he has had substantially less drainage. The amount of slough on the open areas of his wounds has decreased markedly and there is no longer any odor. The back of his right lower leg that was newly open last week has dried up, but is not completely closed yet. 04/15/2023: The improvement continues with significantly less drainage, resulting in less excoriation and irritation. The back of his right lower leg is  closed, as is the left dorsal foot wound. The open areas on the right medial leg are smaller. 04/22/2023: His drainage has diminished significantly. The tissue maceration and excoriation surrounding the right medial leg wound has also improved substantially. There is slough on all of the open wound surfaces. He does have a bit of an odor coming from his wound today, but there is no purulent drainage nor any significant discoloration of the drainage in his VAC. 04/29/2023: He has a new wound on his left lower anterior tibial surface. This was a blister that came up and then ruptured. There is  thin slough on the wound surface but no concern for infection. He has had significantly diminished drainage from his right medial leg ulcers. He was previously going through 4 canisters a day secondary to drainage and this week, he has used only 1 for the entire week. The open areas on his right medial leg are smaller and fewer in number. There is slough on all of the surfaces. No malodor appreciated. 05/06/2023: The wound on his left anterior tibial surface has epithelialized significantly. There is just a small area open in the center. The wound is clean without any slough or other debris accumulation. The drainage on the right leg has almost completely dry. There is slough on the open wound surfaces with significantly more epithelialized between the open sites. He continues on levofloxacin . 05/13/2023: His left lower leg wound is healed. The right lower leg wound continues to improve. The drainage has been minimal and he has not been in the wound VAC for over a week now. There is better skin filling between the open areas and less slough accumulation on the open surfaces. 05/20/2023: The anterior leg wounds are closing up and there is no slough on the open areas that remain. His medial leg wound was measured longer today, but this appears to include some stuck on silver alginate and dry eschar, rather than the actual open wound areas. There is slough accumulation on the open surfaces. Edema control is good and his drainage has not been excessive. 05/27/2023: His wrap slipped up on his foot and he has a new small ulcer on the dorsal aspect of his right foot. The other wounds are basically stable with the usual thick slough accumulation. Drainage remains well-controlled. Edema control is good. 06/03/2023: The dorsal foot ulcer is healed. The other wounds actually are quite a bit smaller this week. There is less depth to the large medial leg ulcers. Edema control is excellent. 06/10/2023: He has had a  new wound open on his left lower leg, secondary to friction from his compression wrap. The medial leg ulcers are all smaller; the anterior wounds were measured larger, but this appears to be due to some minor moisture-related breakdown. He continues on levofloxacin . 06/17/2023: The wound on his left lower leg is nearly closed. There is just a small open area that remains underneath some eschar and dried silver alginate. The medial leg ulcers continue to improve with greater areas of epithelialization between each of the open sites. There is some slough accumulation here. No real change to the anterior leg wounds. Unfortunately, when his wrap was being removed, he was cut with the nurse's scissors so he has a new small wound on his upper right lower leg just below the knee. 07/15/2023: The left lower leg wound is healed. The medial right leg ulcers are significantly smaller since the last time I saw them. There is a wide band of healthy  epithelialized tissue between them. The anterior right leg wound is quite a bit smaller, as well. The wound that occurred as a result of scissor injury several weeks ago has also healed. Edema control is good. 07/29/2023: The right medial leg ulcers have contracted to the point that we need to separate them into 2 different sites. There is some slough accumulation on the surface of each. The anterior right leg wound is down to just a pinhole. Edema control is good. Electronic Signature(s) Signed: 07/29/2023 8:57:49 AM By: [name] Nest MD FACS Entered By: [name] Nest on 07/29/2023 05:57:48 -------------------------------------------------------------------------------- Physical Exam Details Patient Name: Date of Service: [name], [name] 07/29/2023 8:30 A M Medical Record Number: [id number] Patient Account Number: [id number] Date of Birth/Sex: Treating RN: 01/13/1951 (73 y.o. M) Primary Care Provider: Candise [name] Other Clinician: Referring Provider: Treating  Provider/Extender: [name] Nest [name] [name]: 50 Constitutional . . . . no acute distress. Respiratory Normal work of breathing on room air.. Notes 07/29/2023: The right medial leg ulcers have contracted to the point that we need to separate them into 2 different sites. There is some slough accumulation on the surface of each. The anterior right leg wound is down to just a pinhole. Edema control is good. Electronic Signature(s) Signed: 07/29/2023 8:58:16 AM By: [name] Nest MD FACS [place], LAMAR ([id number]) [id number] Page 7 of 16 Entered By: [name] Nest on 07/29/2023 05:58:16 -------------------------------------------------------------------------------- Physician Orders Details Patient Name: Date of Service: [name], [name] 07/29/2023 8:30 A M Medical Record Number: [id number] Patient Account Number: [id number] Date of Birth/Sex: Treating RN: [birth year] (73 y.o. 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 - Dr. Marolyn Return A Anesthetic (In clinic) Topical Lidocaine  4% applied to wound bed Bathing/ Shower/ Hygiene May shower with protection but do not get wound dressing(s)  wet. Protect dressing(s) with water repellant cover (for example, large plastic bag) or a cast cover and may then take shower. Lymphedema [name] Plan - Exercise, Compression and Elevation Bilateral Lower Extremities Exercise daily as tolerated. (Walking, ROM, Calf Pumps and Toe Taps) Compression Garments, Class II. Wear daily when out of bed. May remove at bedtime Compression Wraps as ordered Elevate legs 30 - 60 minutes at or above heart level at least 3 - 4 times daily as able/tolerated Avoid standing for long periods and elevate leg(s) parallel to the floor when sitting Use Pneumatic Compression Device on leg(s) 2-3 times a day for 45-60 minutes Wound [name] Wound #13 - Lower Leg Wound Laterality: Right, Medial, Posterior Cleanser: Soap and Water 1 x Per Week/30 Days Discharge Instructions: May shower and wash wound with dial antibacterial soap and water prior to dressing change. Cleanser: Wound Cleanser 1 x Per Week/30 Days Discharge Instructions: Cleanse the wound with wound cleanser prior to applying a clean dressing using gauze sponges, not tissue or cotton balls. Peri-Wound Care: Skin Prep 1 x Per Week/30 Days Discharge Instructions: cavelon to macerated wound  margins Peri-Wound Care: Sween Lotion (Moisturizing lotion) 1 x Per Week/30 Days Discharge Instructions: Apply moisturizing lotion as directed Prim Dressing: Maxorb Extra Ag+ Alginate Dressing, 4x4.75 (in/in) 1 x Per Week/30 Days ary Discharge Instructions: Apply to excoriated weeping areas Secondary Dressing: ABD Pad, 8x10 1 x Per Week/30 Days Discharge Instructions: Apply over primary dressing as directed. Compression Wrap: ThreePress (3 layer compression wrap) 1 x Per Week/30 Days Discharge Instructions: Apply three layer compression as directed. ZADIN, LANGE ([id number]) [id number] Page 8 of 16 Compression Wrap: Unnaboot w/Calamine, 4x10 (in/yd) 1 x Per Week/30 Days Discharge  Instructions: Apply Unnaboot on distal foot and top of lower leg Wound #3 - Lower Leg Wound Laterality: Right, Medial Cleanser: Soap and Water 1 x Per Week/30 Days Discharge Instructions: May shower and wash wound with dial antibacterial soap and water prior to dressing change. Cleanser: Wound Cleanser 1 x Per Week/30 Days Discharge Instructions: Cleanse the wound with wound cleanser prior to applying a clean dressing using gauze sponges, not tissue or cotton balls. Peri-Wound Care: Skin Prep 1 x Per Week/30 Days Discharge Instructions: cavelon to macerated wound margins Peri-Wound Care: Sween Lotion (Moisturizing lotion) 1 x Per Week/30 Days Discharge Instructions: Apply moisturizing lotion as directed Prim Dressing: Maxorb Extra Ag+ Alginate Dressing, 4x4.75 (in/in) 1 x Per Week/30 Days ary Discharge Instructions: Apply to excoriated weeping areas Secondary Dressing: ABD Pad, 8x10 1 x Per Week/30 Days Discharge Instructions: Apply over primary dressing as directed. Compression Wrap: ThreePress (3 layer compression wrap) 1 x Per Week/30 Days Discharge Instructions: Apply three layer compression as directed. Compression Wrap: Unnaboot w/Calamine, 4x10 (in/yd) 1 x Per Week/30 Days Discharge Instructions: Apply Unnaboot on distal foot and top of lower leg Wound #6 - Lower Leg Wound Laterality: Right, Anterior Cleanser: Soap and Water 1 x Per Week/30 Days Discharge Instructions: May shower and wash wound with dial antibacterial soap and water prior to dressing change. Cleanser: Wound Cleanser 1 x Per Week/30 Days Discharge Instructions: Cleanse the wound with wound cleanser prior to applying a clean dressing using gauze sponges, not tissue or cotton balls. Peri-Wound Care: Sween Lotion (Moisturizing lotion) 1 x Per Week/30 Days Discharge Instructions: Apply moisturizing lotion as directed Prim Dressing: Maxorb Extra Ag+ Alginate Dressing, 2x2 (in/in) 1 x Per Week/30 Days ary Discharge  Instructions: Apply to wound bed as instructed Secondary Dressing: ABD Pad, 8x10 1 x Per Week/30 Days Discharge Instructions: Apply over primary dressing as directed. Compression Wrap: ThreePress (3 layer compression wrap) 1 x Per Week/30 Days Discharge Instructions: Apply three layer compression as directed. Electronic Signature(s) Signed: 07/29/2023 12:14:34 PM By: [name] Nest MD FACS Signed: 07/29/2023 12:47:24 PM By: [name] Handing RN, BSN Entered By: Boehlein, Linda on 07/29/2023 06:01:20 -------------------------------------------------------------------------------- Problem List Details Patient Name: Date of Service: [name], [name] 07/29/2023 8:30 A M Medical Record Number: [id number] Patient Account Number: [id number] Date of Birth/Sex: Treating RN: 1951/02/02 (73 y.o. M) Primary Care Provider: Candise [name] Other Clinician: Referring Provider: Treating Provider/Extender: [name] Nest [name] [name]: [address] 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 02/09/2022 No Yes I89.0 Lymphedema, not elsewhere classified 02/09/2022 No Yes I87.2 Venous insufficiency (chronic) (peripheral) 02/09/2022 No Yes Z86.718 Personal history of other venous thrombosis and embolism 02/09/2022 No Yes I10 Essential (primary) hypertension 02/09/2022 No Yes I25.10 Atherosclerotic heart disease of  native coronary artery without angina pectoris 02/09/2022 No Yes Inactive Problems ICD-10 Code Description Active Date Inactive Date L97.822 Non-pressure chronic ulcer of other part of left lower leg with fat layer exposed 06/10/2023 04/29/2023 Resolved Problems ICD-10 Code Description Active Date Resolved Date L97.821 Non-pressure chronic  ulcer of other part of left lower leg limited to breakdown of skin 03/11/2023 03/11/2023 L98.492 Non-pressure chronic ulcer of skin of other sites with fat layer exposed 03/11/2023 03/11/2023 L97.521 Non-pressure chronic ulcer of other part of left foot limited to breakdown of skin 03/18/2023 02/09/2022 L97.512 Non-pressure chronic ulcer of other part of right foot with fat layer exposed 05/27/2023 02/17/2022 Electronic Signature(s) Signed: 07/29/2023 8:56:35 AM By: [name] Nest MD FACS Entered By: [name] Nest on 07/29/2023 05:56:35 -------------------------------------------------------------------------------- Progress Note Details Patient Name: Date of Service: [name] HUTCH [name] 07/29/2023 8:30 A M Medical Record Number: [id number] Patient Account Number: [id number] Date of Birth/Sex: Treating RN: [birth year] (73 y.o. M) Primary Care Provider: Candise [name] Other Clinician: MATTIE [name] ([id number]) [id number] Page 10 of 16 Referring Provider: Treating Provider/Extender: [name] Nest [name] [name]: 73 Subjective Chief Complaint Information obtained from Patient Patient seen for complaints of Non-Healing Wounds. History of Present Illness (HPI) ADMISSION 02/09/2022 This is a 73 year old man who recently moved to [place]  from [place] . He has a history of of coronary artery disease and prior left popliteal vein DVT . He is not diabetic and does not smoke. 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 He has had multiple spinal surgeries and has limited mobility. He primarily uses a motorized wheelchair. He has had lymphedema for many years and does have lymphedema pumps although he says he does not use them frequently. He has wounds on his bilateral lower extremities. He was receiving care in [place]  for these prior to his move. They were apparently packing his wounds with Betadine soaked gauze. He has worn compression wraps  in the past but not recently. He did say that they helped tremendously. In clinic today, his right ABI is noncompressible but has good signals; the left ABI was 1.17. No formal vascular studies have been performed. On his left dorsal foot, there is a circular lesion that exposes the fat layer. There is fairly heavy slough accumulation within the wound, but no significant odor. On his right medial calf, he has multiple pinholes that have slough buildup in them and probe for about 2 to 4 mm in depth. They are draining clear fluid. On his right lateral midfoot, he has ulceration consistent with venous stasis. It is geographic and has slough accumulation. 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 02/17/2022: All of the wounds are little bit larger today and have more slough accumulation. Today, the medial calf openings are larger and probe to something that feels calcified. There is odor coming from his wounds. His vascular studies are scheduled for August 9 and 10. 02-24-2022 upon evaluation today patient presents for follow-up concerning his ongoing issues here with his lower extremities. With that being said he does have significant problems with what appears to be cellulitis unfortunately the PCR culture that was sent last week got crushed by UPS in transit. With that being said we are going to reobtain a culture today although I am actually to do it on the right medial leg as this seems to be the most inflamed area today where there is some questionable drainage as well.  I am going to remove some of the calcium  deposits which are loosening obtain a deeper culture from this area. Fortunately I do not see any evidence of active infection systemically which is good news but locally I think we are having a bigger issue here. He does have his appointment next Thursday with vascular. Patient has also been referred to  [facility] for further evaluation and [name] of the calcinosis for possible sulfate injections. 03/04/2022: The culture that was performed last week grew out multiple species including Klebsiella, Morganella, and Pseudomonas aeruginosa. He had been prescribed Bactrim but this was not adequate to cover all of the species and levofloxacin  was recommended. He completed the Bactrim but did not pick up the levofloxacin  and begin taking it until yesterday. 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 He had arterial studies performed today. He does have evidence of peripheral arterial disease. Results copied here: +-------+-----------+-----------+------------+------------+ ABI/TBIT oday's ABIT oday's TBIPrevious ABIPrevious TBI +-------+-----------+-----------+------------+------------+ Right [place] 0.75    +-------+-----------+-----------+------------+------------+ Left [place] 0.57    +-------+-----------+-----------+------------+------------+ Arterial wall calcification precludes accurate ankle pressures and ABIs. Summary: Right: Resting right ankle-brachial index indicates noncompressible right lower extremity arteries. The right toe-brachial index is normal. Left: Resting left ankle-brachial index indicates noncompressible left lower extremity arteries. The left toe-brachial index is abnormal. He continues to have further tissue breakdown at the right medial calf and there is ample slough accumulation along with necrotic subcutaneous tissue. The left dorsal foot wound has built up slough, as well. The right medial foot wound is nearly closed with just a light layer of eschar over the surface. The right dorsal lateral foot wound has also accumulated a fair amount of slough and remains quite tender. 03/10/2022: He has  been taking the prescribed levofloxacin  and has about 3 days left of this. The erythema and induration on his right leg has improved. He continues to experience further breakdown of the right medial calf wound. There is extensive slough and debris accumulation there. There is heavy slough on his left dorsal foot wound, but no odor or purulent drainage. The right medial foot wound appears to be closed. The right dorsal lateral foot wound also has a fair amount of slough but it is less tender than at our last visit. He still does not have an appointment with dermatology. 03/22/2022: The right anterior tibial wound has closed. The right lateral foot wound is nearly closed with just a little bit of slough. The left dorsal foot wound is smaller and continues to have some slough buildup but less so than at prior visits. There is good granulation tissue underlying the slough. Unfortunately the right medial calf wound continues to deteriorate. It has a foul odor today and a lot of necrotic tissue, the surrounding skin is also erythematous and moist. 03/30/2022: The right lateral foot wound continues to contract and just has a little bit of slough and eschar present. The left dorsal foot wound is also smaller with slough overlying good granulation tissue. The right medial calf wound actually looks quite a bit better today; there is no odor and there is much less necrotic tissue although some remains present. We have been using topical gentamicin and he has been taking oral Augmentin. 04/07/2022: The patient saw dermatology at [facility] last week. Unfortunately, it appears that they did not read any of the documentation  that was provided. 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 Overall, it was a highly unsatisfactory consultation. In the meantime, however, we were able to identify compounding pharmacy who could create a topical sodium thiosulfate ointment. The patient has that with him today. Both of the right foot wounds are now closed. The left dorsal foot wound has contracted but still has a layer of slough on the surface. The medial calf wounds on the right all look a little bit better. They still have heavy slough along with the chunks of calcium . Everything is stained purple. 04/14/2022: The wound on his dorsal left foot is a little bit smaller again today. There is still slough accumulation on the surface. The medial calf wounds have quite a bit less slough accumulation today. His right leg, however, is warm and erythematous, concerning for cellulitis. [name], [name] ([id number]) [id number] Page 11 of 16 04/14/2022: He completed his course of Augmentin yesterday. The medial calf wound sites are much cleaner today and shallower. There is less fragmented calcium  in the wounds. He has a lot of lymphedema fluid-like drainage from these wounds, but no purulent discharge or malodor. The wound on his dorsal left foot has also contracted and is shallower. There is a layer of slough over healthy granulation tissue. 05/05/2022: The left dorsal foot wound is smaller today with less slough and more granulation tissue. The right medial calf wounds are shallower, but have extensive slough and some fat necrosis. The drainage has diminished considerably, however. 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 05/12/2022: The left dorsal foot wound continues to contract and fill with granulation tissue. There is slough on the wound surface. The right medial calf wounds also continue to fill with healthy tissue,  but there is remaining slough and fat necrosis present. He had a significant increase in his drainage this week and it was a bright yellow-green color. He also had a new wound open over his right anterior tibial surface associated with the spike of cutaneous calcium . The leg is more red, as well, concerning for infection. 05/19/2022: His culture returned positive for Pseudomonas. He is currently taking a course of oral levofloxacin . We have also been using topical gentamicin mixed in with his sodium thiosulfate. All of the wounds look better this week. 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 There is slough and nonviable subcutaneous tissue still present. The new anterior tibial wound just has a light layer of slough. The dorsal foot wound also has some slough present and appears a little dry today. 05/26/2022: The right medial leg wound continues to improve. It is feeling with granulation tissue, but still accumulates a fairly thick layer of slough on the surface. The more recent anterior tibial wound has remained quite small, but also has some slough on the surface. The left dorsal foot wound has contracted somewhat and has perimeter epithelialization. He completed his oral levofloxacin . 06/02/2022: The left dorsal foot wound continues to improve significantly. There is just a little slough on the wound surface. The right medial leg wound had black drainage, but it looks like silver alginate was applied last week and I theorized that  silver sulfide was created with a combination of the silver alginate and the sodium thiosulfate. It did, however, have a bit of an odor to it and extensive slough accumulation. The small anterior tibial wound also had a bit of slough and nonviable subcutaneous tissue present. The skin of his leg was rather macerated, as well. 06/09/2022: The left dorsal foot wound is smaller again this week with just a light layer of slough on the  surface. The right medial leg wound looks substantially better. The leg and periwound skin are no longer red and macerated. There is good granulation tissue forming with less slough and nonviable tissue. The anterior tibial wound just has a small amount of slough accumulation. 06/16/2022: The left dorsal foot wound continues to contract. His wrap slipped a little bit, however, and his foot is more swollen. The right medial leg wound continues to improve. The underlying tissue is more vital-appearing and there is less slough. He does have substantial drainage. The small anterior tibial wound has a bit of slough accumulation. 06/23/2022: The left dorsal foot wound is about half the size as it was last week. There is just some light slough on the surface and some eschar buildup around the edges. The right anterior leg wound is small with a little slough on the surface. Unfortunately, the right medial leg wound deteriorated fairly substantially. It is malodorous with gray/green/black discoloration. 06/30/2022: The left dorsal foot wound continues to contract. It is nearly closed with just a light layer of slough present. The right anterior leg wound is about the same size and has a small amount of slough on the surface. The right medial leg wound looks a lot better than it did last week. The odor has abated and the tissue is less pulpy and necrotic-looking. There is still a fairly thick layer of slough present. 12/13; the left dorsal foot wound looks very healthy. There was no need to change the dressing here and no debridement. He has a small area on the right anterior lower leg apparently had not changed much in size. The area on the medial leg is the most problematic area this is large with some depth and a completely nonviable surface today. 07/14/2022: The left dorsal foot wound is smaller today with just a little slough and eschar present. The right anterior lower leg wound is about the same  without any significant change. The medial leg wound has a black and green surface and is malodorous. No purulent drainage and the periwound skin is intact. Edema control is suboptimal. 07/21/2022: The dorsal foot wound continues to contract and is nearly closed with just a little bit of slough and eschar on the surface. The right anterior lower leg wound is shallower today but otherwise the dimensions are unchanged. The medial leg wound looks significantly better although it still has a nonviable surface; the odor and black/green surface has resolved. His culture came back with multiple species sensitive to Augmentin, which was prescribed and a very low level of Pseudomonas, which should be handled by the topical gentamicin we have been using. 07/28/2022: The dorsal foot wound is down to just a very tiny opening with a little eschar on the surface. The right anterior tibial wound is about the same with some slough buildup. The right medial leg wound looks quite a bit better this week. There is thick rubbery slough on the surface, but the odor and drainage has improved significantly. 08/04/2022: The dorsal foot wound is almost healed, but the periwound  skin has broken down. This appears to be secondary to moisture and adhesive from the absorbent pad. The right anterior tibial wound is shallower. The right medial leg wound continues to improve. It is smaller, still with rubbery slough on the surface. No malodor or purulent drainage. 08/11/2022: The dorsal foot wound is about the same size. It is a little bit dry with some slough accumulation. The right anterior tibial wound is also unchanged. The right medial leg wound is filling in further with good granulation tissue. Still with some slough buildup on the surface. 08/18/2022: His left leg is bright red, hot, and swollen with cellulitis. The dorsal foot wound actually looks quite good and is superficial with just a little bit of slough accumulation. The  right anterior tibial wound is unchanged. The right medial leg wound has filled in with good granulation tissue but has copious amounts of drainage. 08/25/2022: His cellulitis has responded nicely to the oral antibiotics. His dorsal foot wound is smaller but has a fair amount of slough buildup. The right anterior tibial wound remains stable and unchanged. The right medial leg wound has more granulation tissue under a layer of slough, but continues to have copious drainage. The drainage is actually causing skin irritation and threatens to result in breakdown. 09/01/2022: He continues to have profuse drainage from his wounds which is resulting in tissue maceration on both the dorsum of his left foot and the periwound distal to his medial leg wound. The dorsal foot wound is nearly healed, despite this. The medial right leg wound is filling in with good granulation tissue. We are working on getting home health assistance to change his dressings more frequently to try and control the drainage better. 09/08/2022: We have had success in controlling his drainage. The tissue maceration has improved significantly and his swelling has come down quite markedly. The medial right leg wound is much more superficial and has substantially less nonviable tissue present. The anterior tibial wound is a little bit larger, however with some nonviable tissue present. 09/15/2022: The dorsal foot wound is nearly closed. The anterior tibial wound measures slightly larger today. The medial right leg wound continues to improve quite dramatically with less nonviable tissue and more robust-looking granulation. Edema control is significantly better. 09/22/2022: The left dorsal foot wound remains open, albeit quite tiny. The anterior tibial wound is a little bit larger again today. The medial right leg wound continues to improve with an increase in robust and healthy granulation tissue and less accumulation of nonviable tissue. 09/29/2022:  His home health providers wrapped his left leg extremely tightly and he ended up having to cut the wrap off of his foot. This resulted in his foot being very swollen and that the dorsal foot wound is a little bit larger today. The anterior tibial wound is about the same size with a little slough on the surface. FIDENCIO, DUDDY ([id number]) [id number] Page 12 of 16 The medial right leg wound continues to fill in. There are very few areas with any significant depth. There is still slough accumulation. 3/13; Patient has a left dorsal foot wound along with a right anterior tibial wound and right medial wound. We are using silver alginate to the left dorsal foot wound, Santyl and sodium thiosulfate compound ointment to the right lower extremity wounds under 4 layer compression. Patient has no issues or complaints today. 10/13/2022: The left dorsal foot wound is a little bit bigger today, but his foot is also a little bit more swollen. The  right medial leg wound has filled in more and has substantially less depth. There is still some nonviable tissue present. The small anterior lower leg wound looks about the same. 10/20/2022: The left dorsal foot wound is epithelializing. There does not appear to be much open at this site. There is some eschar around the edges. The right medial leg wound continues to fill in with granulation tissue. The small anterior lower leg wound is a little bit bigger and there is a chunk of calcium  protruding. He continues to have fairly copious drainage from the right leg. 10/27/2022: His home health agency failed to show up at all this past week and as a result, the excessive drainage from his right leg wound has resulted in tissue breakdown. His leg is red and irritated. The wounds themselves look about the same. The wound on his left dorsal foot is nearly closed underneath some eschar. 11/03/2022: We continue to have difficulty with his home health agency. He  continues to have significant drainage from his right leg wound which has resulted in even further tissue breakdown. He also was not taking his Lasix  as prescribed and I think much of the drainage has been his lymphedema fluid choosing the path of least resistance. The wound on his left dorsal foot is closed, but in the process of cleaning the site, dry skin was pulled off and he has a new small superficial wound just adjacent to where the dorsal foot wound was. 11/12/2022: His left dorsal foot is completely healed. The right medial leg continues to deteriorate. He is not taking his Lasix  as prescribed, nor is he using his lymphedema pumps at all. As a result, he has massive amounts of drainage saturating his dressings and causing further tissue breakdown on his medial leg. Today, his dressings had a foul odor, as well. 11/19/2022: His right medial leg looks even worse today. There is more erythema and moisture related tissue breakdown. The 2 anterior wounds look a little bit better. The culture that I took last week did produce Pseudomonas and he is currently taking levofloxacin  for this. 11/26/2022: There has been significant improvement to his right medial leg. It is far less raw and many of the superficial open areas have epithelialized. He continues to cut the foot portion of his wrap and as result his foot is bulbous and swollen compared to the rest of his leg, where edema control is good. The smaller of the 2 anterior tibial wound is closed, while the larger is contracting somewhat with a bit of slough at the base. 12/03/2022: His leg continues to demonstrate improvement. There has been epithelialization of much of the denuded area. He has a new small anterior tibial wound that has opened around a nidus of calcium . Edema control is improved. 12/10/2022: Continued improvement in the periwound skin. The anterior tibial wounds are about the same, but the medial leg wound has improved further. There is  less nonviable tissue present. 12/17/2022: Between his visit on Tuesday with the nurse and today, he has had significantly more drainage which has resulted in some periwound tissue maceration. I suspect this correlates somewhat with his diuretic use, as he says he takes his diuretic religiously on Saturday, Sunday, and Monday and then does not take it after Tuesday due to being out and about and not being close to easy restroom access. I suspect the excess fluid is just simply draining out of his leg. 12/21/2022: This was supposed to be a nurse visit, but his leg  has broken down so significantly, that we have converted to a wound care visit. He did take his diuretics over the weekend, but despite this, he has had copious drainage and all of his tissues have broken down substantially from the right medial leg ulcer all the way down to his ankle and even wrapping posterior to this. There is blue-green staining on his dressing, suggesting ongoing presence of Pseudomonas aeruginosa. 12/24/2022: The wound already looks better than it did 2 days ago. He has been taking ciprofloxacin  and the Urgo clean dressing we applied did a much better job of wicking the drainage away from his leg. There is slough on the entire surface of the medial leg wound, but the erythema and induration has improved in the past couple of days. 12/31/2022: Continued improvement of the wound. He says that the pain has basically abated since being on ciprofloxacin . Still with fairly heavy drainage and slough accumulation. 01/07/2023: Overall continued improvement. Still with heavy slough buildup, but less drainage. 01/14/2023: There is a little bit of improvement. The skin is still looking a bit excoriated. He has a few more days left of ciprofloxacin . 01/21/2023: There has been significant improvement. The periwound skin is in much better condition. The ulcers are shallower and actually have some good granulation tissue present under some  slough. 01/28/2023: The wound continues to improve. All of the open areas measured smaller today. There is slough accumulation on the surfaces. His drainage has decreased markedly. 02/04/2023: Continued improvement in the wound. Drainage remains significantly diminished. Slough accumulation is present but to a lesser extent. Periwound skin continues to improve. Edema control is good. 02/18/2023: The heavy drainage persists, although it is was not as significant today as it was at his nurse visit on Tuesday. The drainage is yellow and does not have the blue-green discoloration suggestive of Pseudomonas. The actual wounds on his leg are getting smaller, but the periwound skin is quite excoriated. 02/25/2023: His periwound skin is quite excoriated and he has a lot of clear yellow drainage. The main wounds are filling in further and epithelializing. 03/04/2023: The excoriation of the periwound skin has improved. Most of that area has dried up. The main wounds have less slough in them today. 03/11/2023: The main wounds have accumulated a little bit of slough. The periwound excoriation continues to improve. Unfortunately, he suffered a fall over the previous weekend and has abrasions on his knees bilaterally and on his right wrist and upper arm. In addition, the skin on his left lower leg apparently blistered and opened, leaving several superficial wounds on this extremity. He has been approved for the [facility] and [facility] wound VAC and has the equipment with him today. 03/18/2023: He has extensive excoriation to the periwound skin on the right medial lower leg. The actual wounds themselves are smaller with slough accumulation. The top of his left foot has begun weeping again. Using the [facility] and [facility] wound VAC, he says that he through 4 canisters. He seems to be very fluid overloaded, without any significant dyspnea. 03/25/2023: The wound to his left anterior tibial surface has healed. The left dorsal foot wound has  gotten smaller and has a small area of hypertrophic granulation tissue present. The periwound skin on the right medial lower leg looks slightly better, but still appears excoriated. The actual wounds continue to contract but are still accumulating quite a bit of slough. The tiny right anterior leg wounds are contracting. 04/01/2023: The left dorsal foot wound got a little bit bigger  this week. There is slough accumulation. The periwound skin on the right medial lower leg continues to improve. The main medial right lower leg wounds have a dark gray discoloration and there is a strong odor coming from them. The back of his right lower leg just above the ankle also has a little bit of new breakdown where he is draining edema fluid. The small anterior leg wounds are stable. MATHAN, DARROCH ([id number]) [id number] Page 13 of 16 04/08/2023: Since starting antibiotics last week, there has been tremendous improvement. The left dorsal foot wound is nearly closed with just a little bit of eschar on the surface. The right medial lower leg is less excoriated and red and he has had substantially less drainage. The amount of slough on the open areas of his wounds has decreased markedly and there is no longer any odor. The back of his right lower leg that was newly open last week has dried up, but is not completely closed yet. 04/15/2023: The improvement continues with significantly less drainage, resulting in less excoriation and irritation. The back of his right lower leg is closed, as is the left dorsal foot wound. The open areas on the right medial leg are smaller. 04/22/2023: His drainage has diminished significantly. The tissue maceration and excoriation surrounding the right medial leg wound has also improved substantially. There is slough on all of the open wound surfaces. He does have a bit of an odor coming from his wound today, but there is no purulent drainage nor any significant  discoloration of the drainage in his VAC. 04/29/2023: He has a new wound on his left lower anterior tibial surface. This was a blister that came up and then ruptured. There is thin slough on the wound surface but no concern for infection. He has had significantly diminished drainage from his right medial leg ulcers. He was previously going through 4 canisters a day secondary to drainage and this week, he has used only 1 for the entire week. The open areas on his right medial leg are smaller and fewer in number. There is slough on all of the surfaces. No malodor appreciated. 05/06/2023: The wound on his left anterior tibial surface has epithelialized significantly. There is just a small area open in the center. The wound is clean without any slough or other debris accumulation. The drainage on the right leg has almost completely dry. There is slough on the open wound surfaces with significantly more epithelialized between the open sites. He continues on levofloxacin . 05/13/2023: His left lower leg wound is healed. The right lower leg wound continues to improve. The drainage has been minimal and he has not been in the wound VAC for over a week now. There is better skin filling between the open areas and less slough accumulation on the open surfaces. 05/20/2023: The anterior leg wounds are closing up and there is no slough on the open areas that remain. His medial leg wound was measured longer today, but this appears to include some stuck on silver alginate and dry eschar, rather than the actual open wound areas. There is slough accumulation on the open surfaces. Edema control is good and his drainage has not been excessive. 05/27/2023: His wrap slipped up on his foot and he has a new small ulcer on the dorsal aspect of his right foot. The other wounds are basically stable with the usual thick slough accumulation. Drainage remains well-controlled. Edema control is good. 06/03/2023: The dorsal foot ulcer is  healed. The other wounds  actually are quite a bit smaller this week. There is less depth to the large medial leg ulcers. Edema control is excellent. 06/10/2023: He has had a new wound open on his left lower leg, secondary to friction from his compression wrap. The medial leg ulcers are all smaller; the anterior wounds were measured larger, but this appears to be due to some minor moisture-related breakdown. He continues on levofloxacin . 06/17/2023: The wound on his left lower leg is nearly closed. There is just a small open area that remains underneath some eschar and dried silver alginate. The medial leg ulcers continue to improve with greater areas of epithelialization between each of the open sites. There is some slough accumulation here. No real change to the anterior leg wounds. Unfortunately, when his wrap was being removed, he was cut with the nurse's scissors so he has a new small wound on his upper right lower leg just below the knee. 07/15/2023: The left lower leg wound is healed. The medial right leg ulcers are significantly smaller since the last time I saw them. There is a wide band of healthy epithelialized tissue between them. The anterior right leg wound is quite a bit smaller, as well. The wound that occurred as a result of scissor injury several weeks ago has also healed. Edema control is good. 07/29/2023: The right medial leg ulcers have contracted to the point that we need to separate them into 2 different sites. There is some slough accumulation on the surface of each. The anterior right leg wound is down to just a pinhole. Edema control is good. Objective Constitutional no acute distress. Vitals Time Taken: 8:27 AM, Height: 71 in, Weight: 267 lbs, BMI: 37.2, Temperature: 97.7 F, Pulse: 64 bpm, Respiratory Rate: 18 breaths/min, Blood Pressure: 139/79 mmHg. Respiratory Normal work of breathing on room air.. General Notes: 07/29/2023: The right medial leg ulcers have contracted to  the point that we need to separate them into 2 different sites. There is some slough accumulation on the surface of each. The anterior right leg wound is down to just a pinhole. Edema control is good. Integumentary (Hair, Skin) Wound #13 status is Open. Original cause of wound was Gradually Appeared. The date acquired was: 07/29/2023. The wound is located on the Right,Medial,Posterior Lower Leg. The wound measures 0.5cm length x 0.7cm width x 0.1cm depth; 0.275cm^2 area and 0.027cm^3 volume. There is Fat Layer (Subcutaneous Tissue) exposed. There is no tunneling or undermining noted. There is a medium amount of serosanguineous drainage noted. The wound margin is distinct with the outline attached to the wound base. There is no granulation within the wound bed. There is a large (67-100%) amount of necrotic tissue within the wound bed including Adherent Slough. The periwound skin appearance had no abnormalities noted for texture. The periwound skin appearance had no abnormalities noted for moisture. The periwound skin appearance exhibited: Hemosiderin Staining. Periwound temperature was noted as No Abnormality. Wound #3 status is Open. Original cause of wound was Gradually Appeared. The date acquired was: 12/07/2021. The wound has been in [name] 76 weeks. The wound is located on the Right,Medial Lower Leg. The wound measures 0.9cm length x 0.7cm width x 0.1cm depth; 0.495cm^2 area and 0.049cm^3 volume. There is Fat Layer (Subcutaneous Tissue) exposed. There is no tunneling or undermining noted. There is a medium amount of serous drainage noted. The wound margin is distinct with the outline attached to the wound base. There is small (1-33%) red granulation within the wound bed. There is a large (  67-100%) amount of necrotic tissue within the wound bed. The periwound skin appearance exhibited: Hemosiderin Staining. The periwound skin appearance did not exhibit: Callus, Crepitus, Excoriation, Induration,  Rash, Scarring, Dry/Scaly, Maceration, Atrophie Blanche, Cyanosis, Ecchymosis, Mottled, Pallor, Rubor, Erythema. Periwound temperature was noted as No Abnormality. Wound #6 status is Open. Original cause of wound was Gradually Appeared. The date acquired was: 05/12/2022. The wound has been in [name] 63 weeks. The wound is located on the Right,Anterior Lower Leg. The wound measures 0.2cm length x 0.2cm width x 0.1cm depth; 0.031cm^2 area and 0.003cm^3 [name], KNEBEL ([id number]) [id number].pdf Page 14 of 16 volume. There is Fat Layer (Subcutaneous Tissue) exposed. There is no tunneling or undermining noted. There is a small amount of serous drainage noted. The wound margin is distinct with the outline attached to the wound base. There is no granulation within the wound bed. There is a large (67-100%) amount of necrotic tissue within the wound bed including Adherent Slough. The periwound skin appearance exhibited: Hemosiderin Staining. The periwound skin appearance did not exhibit: Callus, Crepitus, Excoriation, Induration, Rash, Scarring, Dry/Scaly, Maceration, Atrophie Blanche, Cyanosis, Ecchymosis, Mottled, Pallor, Rubor, Erythema. Periwound temperature was noted as No Abnormality. 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 There was a Excisional Skin/Subcutaneous Tissue Debridement with a total area of 0.27 sq cm performed by [name] Nest, MD. With the following instrument(s): Curette to remove Viable and  Non-Viable tissue/material. Material removed includes Subcutaneous Tissue and Slough and after achieving pain control using Lidocaine  4% T opical Solution. No specimens were taken. A time out was conducted at 08:50, prior to the start of the procedure. A Minimum amount of bleeding was controlled with Pressure. The procedure was tolerated well with a pain level of 0 throughout and a pain level of 0 following the procedure. Post Debridement Measurements: 0.5cm length x 0.7cm width x 0.1cm depth; 0.027cm^3 volume. Character of Wound/Ulcer Post Debridement is improved. Post procedure Diagnosis Wound #13: Same as Pre-Procedure Wound #3 Pre-procedure diagnosis of Wound #3 is a Lymphedema located on the Right,Medial Lower Leg . There was a Excisional Skin/Subcutaneous Tissue Debridement with a total area of 0.49 sq cm performed by [name] Nest, MD. With the following instrument(s): Curette to remove Viable and Non-Viable tissue/material. Material removed includes Subcutaneous Tissue and Slough and after achieving pain control using Lidocaine  4% T opical Solution. No specimens were taken. A time out was conducted at 08:50, prior to the start of the procedure. A Minimum amount of bleeding was controlled with Pressure. The procedure was tolerated well with a pain level of 0 throughout and a pain level of 0 following the procedure. Post Debridement Measurements: 0.9cm length x 0.7cm width x 0.1cm depth; 0.049cm^3 volume. Character of Wound/Ulcer Post Debridement is improved. Post procedure Diagnosis Wound #3: Same as Pre-Procedure Pre-procedure diagnosis of Wound #3 is a Lymphedema located on the Right,Medial Lower Leg . There was a Three Layer Compression Therapy Procedure by [name] Handing, RN. Post procedure Diagnosis Wound #3: Same as Pre-Procedure Plan Follow-up Appointments: Return Appointment in 1 week. - Dr. Marolyn Anesthetic: (In clinic) Topical Lidocaine  4% applied to wound bed Bathing/  Shower/ Hygiene: May shower with  protection but do not get wound dressing(s) wet. Protect dressing(s) with water repellant cover (for example, large plastic bag) or a cast cover and may then take shower. Lymphedema [name] Plan - Exercise, Compression and Elevation: Exercise daily as tolerated. (Walking, ROM, Calf Pumps and T T oe aps) Compression Garments, Class II. Wear daily when out of bed. May remove at bedtime Compression Wraps as ordered Elevate legs 30 - 60 minutes at or above heart level at least 3 - 4 times daily as able/tolerated Avoid standing for long periods and elevate leg(s) parallel to the floor when sitting Use Pneumatic Compression Device on leg(s) 2-3 times a day for 45-60 minutes WOUND #13: - Lower Leg Wound Laterality: Right, Medial, Posterior Cleanser: Soap and Water 1 x Per Week/30 Days Discharge Instructions: May shower and wash wound with dial antibacterial soap and water prior to dressing change. Cleanser: Wound Cleanser 1 x Per Week/30 Days Discharge Instructions: Cleanse the wound with wound cleanser prior to applying a clean dressing using gauze sponges, not tissue or cotton balls. Peri-Wound Care: Skin Prep 1 x Per Week/30 Days Discharge Instructions: cavelon to macerated wound margins Peri-Wound Care: Sween Lotion (Moisturizing lotion) 1 x Per Week/30 Days Discharge Instructions: Apply moisturizing lotion as directed Prim Dressing: Maxorb Extra Ag+ Alginate Dressing, 4x4.75 (in/in) 1 x Per Week/30 Days ary Discharge Instructions: Apply to excoriated weeping areas Secondary Dressing: ABD Pad, 8x10 1 x Per Week/30 Days MICKAL, MENO ([id number]) [id number] Page 15 of 16 Discharge Instructions: Apply over primary dressing as directed. Com pression Wrap: ThreePress (3 layer compression wrap) 1 x Per Week/30 Days Discharge Instructions: Apply three layer compression as directed. Com pression Wrap: Unnaboot w/Calamine, 4x10 (in/yd) 1 x  Per Week/30 Days Discharge Instructions: Apply Unnaboot on distal foot and top of lower leg WOUND #3: - Lower Leg Wound Laterality: Right, Medial Cleanser: Soap and Water 1 x Per Week/30 Days Discharge Instructions: May shower and wash wound with dial antibacterial soap and water prior to dressing change. Cleanser: Wound Cleanser 1 x Per Week/30 Days Discharge Instructions: Cleanse the wound with wound cleanser prior to applying a clean dressing using gauze sponges, not tissue or cotton balls. Peri-Wound Care: Skin Prep 1 x Per Week/30 Days Discharge Instructions: cavelon to macerated wound margins Peri-Wound Care: Sween Lotion (Moisturizing lotion) 1 x Per Week/30 Days Discharge Instructions: Apply moisturizing lotion as directed Prim Dressing: Maxorb Extra Ag+ Alginate Dressing, 4x4.75 (in/in) 1 x Per Week/30 Days ary Discharge Instructions: Apply to excoriated weeping areas Secondary Dressing: ABD Pad, 8x10 1 x Per Week/30 Days Discharge Instructions: Apply over primary dressing as directed. Com pression Wrap: ThreePress (3 layer compression wrap) 1 x Per Week/30 Days Discharge Instructions: Apply three layer compression as directed. Com pression Wrap: Unnaboot w/Calamine, 4x10 (in/yd) 1 x Per Week/30 Days Discharge Instructions: Apply Unnaboot on distal foot and top of lower leg WOUND #6: - Lower Leg Wound Laterality: Right, Anterior Cleanser: Soap and Water 1 x Per Week/30 Days Discharge Instructions: May shower and wash wound with dial antibacterial soap and water prior to dressing change. Cleanser: Wound Cleanser 1 x Per Week/30 Days Discharge Instructions: Cleanse the wound with wound cleanser prior to applying a clean dressing using gauze sponges, not tissue or cotton balls. Peri-Wound Care: Sween Lotion (Moisturizing lotion) 1 x Per Week/30 Days Discharge Instructions: Apply moisturizing lotion as directed Prim Dressing: Maxorb Extra Ag+ Alginate Dressing, 2x2 (in/in) 1 x Per  Week/30 Days ary Discharge Instructions: Apply to wound bed as instructed Secondary  Dressing: ABD Pad, 8x10 1 x Per Week/30 Days Discharge Instructions: Apply over primary dressing as directed. Com pression Wrap: ThreePress (3 layer compression wrap) 1 x Per Week/30 Days Discharge Instructions: Apply three layer compression as directed. 07/29/2023: The right medial leg ulcers have contracted to the point that we need to separate them into 2 different sites. There is some slough accumulation on the surface of each. The anterior right leg wound is down to just a pinhole. Edema control is good. I used a curette to debride slough and subcutaneous tissue from each of the sites. We will continue silver alginate with 3 layer compression. We really have made the most progress on this wound since I have kept him on levofloxacin . 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 I renewed his prescription today. Follow-up in 1 week. Electronic Signature(s) Signed: 07/29/2023 1:00:54 PM By: Drury Nestle RN, BSN Signed: 08/01/2023 8:37:20 AM By: [name] Nest MD FACS Previous Signature: 07/29/2023 9:00:06 AM Version By: [name] Nest MD FACS Entered By: Drury Nestle on 07/29/2023 09:58:30 -------------------------------------------------------------------------------- SuperBill Details Patient Name: Date of Service: WOODARD, PERRELL 07/29/2023 Medical Record Number: [id number] Patient Account Number: [id number] Date of Birth/Sex: Treating RN: 1951/04/10 (73 y.o. M) Primary Care Provider: Candise [name] Other Clinician: Referring Provider: Treating Provider/Extender: [name] Nest [name] [name]: 73 Diagnosis Coding ICD-10 Codes Code Description [phone] 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.0 Lymphedema, not elsewhere  classified I87.2 Venous insufficiency (chronic) (peripheral) Z86.718 Personal history of other venous thrombosis and embolism I10 Essential (primary) hypertension Fahey, Timithy ([id number]) [id number].pdf Page 16 of 16 I25.10 Atherosclerotic heart disease of native coronary artery without angina pectoris Facility Procedures : CPT4 Code: [id number] Description: 11042 - DEB SUBQ TISSUE 20 SQ CM/< ICD-10 Diagnosis Description L97.812 Non-pressure chronic ulcer of other part of right lower leg with fat layer exp Modifier: osed Quantity: 1 Physician Procedures : CPT4 Code Description Modifier [id number] 99214 - WC PHYS LEVEL 4 - EST PT 25 ICD-10 Diagnosis Description L97.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.0 Lymphedema, not elsewhere  classified I87.2 Venous insufficiency (chronic) (peripheral) Quantity: 1 : [id number] 11042 - WC PHYS SUBQ TISS 20 SQ CM ICD-10 Diagnosis Description L97.812 Non-pressure chronic ulcer of other part of right lower leg with fat layer exposed Quantity: 1 Electronic Signature(s) Signed: 07/29/2023 9:00:22 AM By: [name] Nest MD FACS Entered By: [name] Nest on 07/29/2023 06:00:22

## 2023-08-01 ENCOUNTER — Telehealth: Payer: Self-pay

## 2023-08-01 NOTE — Telephone Encounter (Signed)
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   He has had some lung infections in the past that could affect it    He is on Eliquis     I would confirm dosing     I would repeat high resolution CT with contrast March (evaluate lungs and r/o PE)

## 2023-08-01 NOTE — Telephone Encounter (Signed)
 I talked with the pt re: his CT and he reports that he has had significant lung issues in the past:   2002 he had [place] fever that required Diflucan treatment for 2 1/2 years acquired from living in [place]. He also had a lung infection in 2010.   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    Pt asking if he would still need the repeat CT in March with all of this added information.   Will send to Dr Okey for review.

## 2023-08-05 ENCOUNTER — Ambulatory Visit (HOSPITAL_BASED_OUTPATIENT_CLINIC_OR_DEPARTMENT_OTHER): Payer: Medicare Other | Admitting: General Surgery

## 2023-08-12 ENCOUNTER — Encounter (HOSPITAL_BASED_OUTPATIENT_CLINIC_OR_DEPARTMENT_OTHER): Payer: Medicare Other | Admitting: General Surgery

## 2023-08-12 DIAGNOSIS — Z86718 Personal history of other venous thrombosis and embolism: Secondary | ICD-10-CM | POA: Diagnosis not present

## 2023-08-12 DIAGNOSIS — L942 Calcinosis cutis: Secondary | ICD-10-CM | POA: Diagnosis not present

## 2023-08-12 DIAGNOSIS — I872 Venous insufficiency (chronic) (peripheral): Secondary | ICD-10-CM | POA: Diagnosis not present

## 2023-08-12 DIAGNOSIS — L97812 Non-pressure chronic ulcer of other part of right lower leg with fat layer exposed: Secondary | ICD-10-CM | POA: Diagnosis not present

## 2023-08-12 DIAGNOSIS — I890 Lymphedema, not elsewhere classified: Secondary | ICD-10-CM | POA: Diagnosis not present

## 2023-08-12 DIAGNOSIS — I2510 Atherosclerotic heart disease of native coronary artery without angina pectoris: Secondary | ICD-10-CM | POA: Diagnosis not present

## 2023-08-12 NOTE — Progress Notes (Signed)
[name] ([id number]) [id number] Page 1 of 11 Visit Report for 08/12/2023 Arrival Information Details Patient Name: Date of Service: [name] 08/12/2023 10:30 A M Medical Record Number: [id number] Patient Account Number: [id number] Date of Birth/Sex: Treating RN: 01-16-1951 (73 y.o. M) Primary Care Elsey Holts: [name] Other Clinician: Referring Kieffer Blatz: Treating Marisol Glazer/Extender: [name] in Treatment: 14 Visit Information History Since Last Visit Added or deleted any medications: No Patient Arrived: Wheel Chair Any new allergies or adverse reactions: No Arrival Time: 10:21 Had a fall or experienced change in No Accompanied By: self activities of daily living that may affect Transfer Assistance: None risk of falls: Patient Identification Verified: Yes Signs or symptoms of abuse/neglect since last visito No Secondary Verification Process Completed: Yes Hospitalized since last visit: No Patient Requires Transmission-Based Precautions: No Implantable device outside of the clinic excluding No Patient Has Alerts: Yes cellular tissue based products placed in the center Patient Alerts: R ABI= N/C, TBI= .75 since last visit: L ABI =N/C, TBI = .57 Has Dressing in Place as Prescribed: Yes Has Compression in Place as Prescribed: Yes Pain Present Now: No Electronic Signature(s) Signed: 08/12/2023 1:37:16 PM By: Zenaida Deed RN, BSN Entered By: Zenaida Deed on 08/12/2023 10:59:33 -------------------------------------------------------------------------------- Compression Therapy Details Patient Name: Date of Service: [name] 08/12/2023 10:30 A M Medical Record Number: [id number] Patient Account Number: [id number] Date of Birth/Sex: Treating RN: [birth year] (73 y.o. [name] Primary Care Zanyah Lentsch: [name] Other Clinician: Referring Alicia Seib: Treating Jamael Hoffmann/Extender: [name] in Treatment: 08 Compression Therapy Performed for Wound Assessment: Wound #3 Right,Medial Lower Leg Performed By: Clinician Thayer Dallas, Compression Type: Three Layer Post Procedure Diagnosis Same as Pre-procedure Electronic Signature(s) Signed: 08/12/2023 1:37:16 PM By: Zenaida Deed RN, BSN Entered By: Zenaida Deed on 08/12/2023 12:34:03 [name] ([id number]) [id number] Page 2 of 11 -------------------------------------------------------------------------------- Encounter Discharge Information Details Patient Name: Date of Service: [name] 08/12/2023 10:30 A M Medical Record Number: [id number] Patient Account Number: [id number] Date of Birth/Sex: Treating RN: Jul 27, 1950 (73 y.o. [name] Primary Care Darrelle Wiberg: [name] Other Clinician: Referring Dexter Signor: Treating Juergen Hardenbrook/Extender: [name] in Treatment: 67 Encounter Discharge Information Items Post Procedure Vitals Discharge Condition: Stable Temperature (F): 97.7 Ambulatory Status: Wheelchair Pulse (bpm): 71 Discharge Destination: Home Respiratory Rate (breaths/min): 20 Transportation: Private Auto Blood Pressure (mmHg): 117/61 Accompanied By: self Schedule Follow-up Appointment: Yes Clinical Summary of Care: Patient Declined Electronic Signature(s) Signed: 08/12/2023 1:37:16 PM By: Zenaida Deed RN, BSN Entered By: Zenaida Deed on 08/12/2023 12:35:32 -------------------------------------------------------------------------------- Lower Extremity Assessment Details Patient Name: Date of Service: [name] 08/12/2023 10:30 A M Medical Record Number: [id number] Patient Account Number: [id number] Date of Birth/Sex: Treating RN: [birth year] (73 y.o. [name] Primary Care Jeiden Daughtridge: [name] Other Clinician: Referring Jordell Outten: Treating Navil Kole/Extender: [name] in Treatment: 78 Edema Assessment Assessed: Kyra Searles: No] [Right: No] Edema: [Left: Ye] [Right: s] Calf Left: Right: Point of Measurement: 35 cm From Medial Instep 43.5 cm Ankle Left: Right: Point of Measurement: 13 cm From Medial Instep 28 cm Vascular Assessment Pulses: Dorsalis Pedis Palpable: [Right:Yes] Extremity colors, hair growth, and conditions: Extremity Color: [Right:Hyperpigmented] Hair Growth on Extremity: [Right:Yes] Temperature of Extremity: [Right:Warm] Capillary Refill: [Right:< 3 seconds] Dependent Rubor: [Right:No Yes] Electronic Signature(s) Signed: 08/12/2023 1:37:16 PM By: Zenaida Deed RN, BSN Entered By: Zenaida Deed on 08/12/2023 11:00:48 [name] ([id number]) [id number] Page 3 of 11 -------------------------------------------------------------------------------- Multi Wound Chart Details Patient Name:  Date of Service: [name] 08/12/2023 10:30 A M Medical Record Number: [id number] Patient Account Number: [id number] Date of Birth/Sex: Treating RN: 1950-09-25 (73 y.o. M) Primary Care Shandra Szymborski: [name] Other Clinician: Referring Todd Jelinski: Treating Armonie Staten/Extender: [name] in Treatment: 29 Vital Signs Height(in): 71 Pulse(bpm): 71 Weight(lbs): 267 Blood Pressure(mmHg): 117/61 Body Mass Index(BMI): 37.2 Temperature(F): 97.7 Respiratory Rate(breaths/min): 18 [13:Photos:] Right, Medial, Posterior Lower Leg Right, Medial Lower Leg Right, Anterior Lower Leg Wound Location: Gradually Appeared Gradually Appeared Gradually Appeared Wounding Event: Calcinosis Lymphedema Calcinosis Primary Etiology: Lymphedema N/A N/A Secondary Etiology: Cataracts, Anemia, Lymphedema, Cataracts, Anemia, Lymphedema, Cataracts, Anemia, Lymphedema, Comorbid History: Congestive Heart Failure, Deep Vein Congestive Heart Failure, Deep Vein Congestive Heart Failure, Deep Vein Thrombosis,  Hypertension, Peripheral Thrombosis, Hypertension, Peripheral Thrombosis, Hypertension, Peripheral Venous Disease Venous Disease Venous Disease 07/29/2023 12/07/2021 05/12/2022 Date Acquired: 2 78 65 Weeks of Treatment: Open Open Open Wound Status: No No No Wound Recurrence: No No Yes Clustered Wound: 0.4x0.6x0.1 0.9x0.7x0.1 0.1x0.1x0.1 Measurements L x W x D (cm) 0.188 0.495 0.008 A (cm) : rea 0.019 0.049 0.001 Volume (cm) : 31.60% 90.70% 96.60% % Reduction in A rea: 29.60% 97.70% 95.80% % Reduction in Volume: Full Thickness Without Exposed Full Thickness With Exposed Support Full Thickness Without Exposed Classification: Support Structures Structures Support Structures Medium Medium Small Exudate A mount: Serosanguineous Serosanguineous Serous Exudate Type: red, brown red, brown amber Exudate Color: Distinct, outline attached Distinct, outline attached Distinct, outline attached Wound Margin: Medium (34-66%) Medium (34-66%) None Present (0%) Granulation A mount: Red Red N/A Granulation Quality: Medium (34-66%) Medium (34-66%) None Present (0%) Necrotic A mount: Fat Layer (Subcutaneous Tissue): Yes Fat Layer (Subcutaneous Tissue): Yes Fat Layer (Subcutaneous Tissue): Yes Exposed Structures: Fascia: No Fascia: No Fascia: No Tendon: No Tendon: No Tendon: No Muscle: No Muscle: No Muscle: No Joint: No Joint: No Joint: No Bone: No Bone: No Bone: No Small (1-33%) Small (1-33%) Large (67-100%) Epithelialization: Debridement - Excisional Debridement - Excisional N/A Debridement: Pre-procedure Verification/Time Out 11:05 11:05 N/A Taken: Lidocaine 4% Topical Solution Lidocaine 4% Topical Solution N/A Pain Control: Subcutaneous, Slough Subcutaneous, Slough N/A Tissue Debrided: Skin/Subcutaneous Tissue Skin/Subcutaneous Tissue N/A Level: 0.19 0.49 N/A Debridement A (sq cm): rea Curette Curette N/A Instrument: Minimum Minimum N/A Bleeding: Pressure  Pressure N/A Hemostasis A chieved: 0 0 N/A Procedural Pain: 0 0 N/A Post Procedural Pain: Procedure was tolerated well Procedure was tolerated well N/A Debridement Treatment ResponseARAEL, KOLTERMAN ([id number]) [id number] Page 4 of 11 Post Debridement Measurements L x 0.4x0.6x0.1 0.9x0.7x0.1 N/A W x D (cm) 0.019 0.049 N/A Post Debridement Volume: (cm) No Abnormalities Noted Excoriation: No Excoriation: No Periwound Skin Texture: Induration: No Induration: No Callus: No Callus: No Crepitus: No Crepitus: No Rash: No Rash: No Scarring: No Scarring: No No Abnormalities Noted Dry/Scaly: Yes Maceration: No Periwound Skin Moisture: Maceration: No Dry/Scaly: No Hemosiderin Staining: Yes Hemosiderin Staining: Yes Hemosiderin Staining: Yes Periwound Skin Color: Atrophie Blanche: No Atrophie Blanche: No Cyanosis: No Cyanosis: No Ecchymosis: No Ecchymosis: No Erythema: No Erythema: No Mottled: No Mottled: No Pallor: No Pallor: No Rubor: No Rubor: No No Abnormality No Abnormality No Abnormality Temperature: Yes N/A N/A Tenderness on Palpation: probes to calcium deposit N/A N/A Assessment Notes: Debridement Debridement N/A Procedures Performed: Treatment Notes Electronic Signature(s) Signed: 08/12/2023 11:11:00 AM By: Duanne Guess MD FACS Entered By: Duanne Guess on 08/12/2023 11:11:00 -------------------------------------------------------------------------------- Multi-Disciplinary Care Plan Details Patient Name: Date of Service: [name] 08/12/2023 10:30 A M Medical Record Number: [id number] Patient Account  Number: [id number] Date of Birth/Sex: Treating RN: 1951/05/15 (73 y.o. [name] Primary Care Deziah Renwick: [name] Other Clinician: Referring Vencent Hauschild: Treating Gennaro Lizotte/Extender: [name] in Treatment: 54 Multidisciplinary Care Plan reviewed with physician Active  Inactive Venous Leg Ulcer Nursing Diagnoses: Knowledge deficit related to disease process and management Potential for venous Insuffiency (use before diagnosis confirmed) Goals: Patient will maintain optimal edema control Date Initiated: 02/17/2022 Target Resolution Date: 09/23/2023 Goal Status: Active Interventions: Assess peripheral edema status every visit. Compression as ordered Provide education on venous insufficiency Treatment Activities: Non-invasive vascular studies : 02/17/2022 Therapeutic compression applied : 02/17/2022 Notes: Wound/Skin Impairment Nursing Diagnoses: Impaired tissue integrity QUINTEN, WITTING ([id number]) [id number] Page 5 of 11 Knowledge deficit related to ulceration/compromised skin integrity Goals: Patient/caregiver will verbalize understanding of skin care regimen Date Initiated: 02/17/2022 Target Resolution Date: 09/23/2023 Goal Status: Active Ulcer/skin breakdown will have a volume reduction of 30% by week 4 Date Initiated: 02/17/2022 Date Inactivated: 03/10/2022 Target Resolution Date: 03/10/2022 Unmet Reason: infection being treated, Goal Status: Unmet waiting on derm appte Ulcer/skin breakdown will have a volume reduction of 50% by week 8 Date Initiated: 03/10/2022 Date Inactivated: 04/14/2022 Target Resolution Date: 04/07/2022 Goal Status: Unmet Unmet Reason: calcinosis Ulcer/skin breakdown will have a volume reduction of 80% by week 12 Date Initiated: 04/14/2022 Date Inactivated: 05/05/2022 Target Resolution Date: 05/05/2022 Unmet Reason: infection, chronic Goal Status: Unmet condition Interventions: Assess patient/caregiver ability to obtain necessary supplies Assess patient/caregiver ability to perform ulcer/skin care regimen upon admission and as needed Assess ulceration(s) every visit Provide education on ulcer and skin care Treatment Activities: Skin care regimen initiated : 02/17/2022 Topical wound management  initiated : 02/17/2022 Notes: Electronic Signature(s) Signed: 08/12/2023 1:37:16 PM By: Zenaida Deed RN, BSN Entered By: Zenaida Deed on 08/12/2023 11:08:22 -------------------------------------------------------------------------------- Pain Assessment Details Patient Name: Date of Service: [name] 08/12/2023 10:30 A M Medical Record Number: [id number] Patient Account Number: [id number] Date of Birth/Sex: Treating RN: 10-12-1950 (73 y.o. M) Primary Care Kanaya Gunnarson: [name] Other Clinician: Referring Klinton Candelas: Treating Tonie Vizcarrondo/Extender: [name] in Treatment: 78 Active Problems Location of Pain Severity and Description of Pain Patient Has Paino No Site Locations Rate the pain. Current Pain Level: 0 Pain Management and Medication SHAMS, DOZOIS ([id number]) [id number] Page 6 of 11 Current Pain Management: Electronic Signature(s) Signed: 08/12/2023 1:37:16 PM By: Zenaida Deed RN, BSN Entered By: Zenaida Deed on 08/12/2023 10:59:50 -------------------------------------------------------------------------------- Patient/Caregiver Education Details Patient Name: Date of Service: [name] 1/17/2025andnbsp10:30 A M Medical Record Number: [id number] Patient Account Number: [id number] Date of Birth/Gender: Treating RN: 12-May-1951 (73 y.o. [name] Primary Care Physician: [name] Other Clinician: Referring Physician: Treating Physician/Extender: [name] in Treatment: 78 Education Assessment Education Provided To: Patient Education Topics Provided Venous: Methods: Explain/Verbal Responses: Reinforcements needed, State content correctly Wound/Skin Impairment: Methods: Explain/Verbal Responses: Reinforcements needed, State content correctly Electronic Signature(s) Signed: 08/12/2023 1:37:16 PM By: Zenaida Deed RN, BSN Entered By: Zenaida Deed on  08/12/2023 11:09:05 -------------------------------------------------------------------------------- Wound Assessment Details Patient Name: Date of Service: [name] 08/12/2023 10:30 A M Medical Record Number: [id number] Patient Account Number: [id number] Date of Birth/Sex: Treating RN: 25-Jul-1951 (73 y.o. M) Primary Care Arriel Victor: [name] Other Clinician: Referring Tasnia Spegal: Treating Jatinder Mcdonagh/Extender: [name] in Treatment: 78 Wound Status Wound Number: 13 Primary Calcinosis Etiology: Wound Location: Right, Medial, Posterior Lower Leg Secondary Lymphedema Wounding Event: Gradually Appeared Etiology: Date Acquired: 07/29/2023 Wound Open Weeks Of Treatment: 2 Status: Clustered Wound:  No Comorbid Cataracts, Anemia, Lymphedema, Congestive Heart Failure, Deep History: Vein Thrombosis, Hypertension, Peripheral Venous Disease Photos TREYDON, LOZIER ([id number]) [id number] Page 7 of 11 Wound Measurements Length: (cm) 0.4 Width: (cm) 0.6 Depth: (cm) 0.1 Area: (cm) 0.188 Volume: (cm) 0.019 % Reduction in Area: 31.6% % Reduction in Volume: 29.6% Epithelialization: Small (1-33%) Tunneling: No Undermining: No Wound Description Classification: Full Thickness Without Exposed Support Structures Wound Margin: Distinct, outline attached Exudate Amount: Medium Exudate Type: Serosanguineous Exudate Color: red, brown Foul Odor After Cleansing: No Slough/Fibrino Yes Wound Bed Granulation Amount: Medium (34-66%) Exposed Structure Granulation Quality: Red Fascia Exposed: No Necrotic Amount: Medium (34-66%) Fat Layer (Subcutaneous Tissue) Exposed: Yes Necrotic Quality: Adherent Slough Tendon Exposed: No Muscle Exposed: No Joint Exposed: No Bone Exposed: No Periwound Skin Texture Texture Color No Abnormalities Noted: Yes No Abnormalities Noted: No Hemosiderin Staining: Yes Moisture No Abnormalities Noted: Yes  Temperature / Pain Temperature: No Abnormality Tenderness on Palpation: Yes Assessment Notes probes to calcium deposit Treatment Notes Wound #13 (Lower Leg) Wound Laterality: Right, Medial, Posterior Cleanser Soap and Water Discharge Instruction: May shower and wash wound with dial antibacterial soap and water prior to dressing change. Wound Cleanser Discharge Instruction: Cleanse the wound with wound cleanser prior to applying a clean dressing using gauze sponges, not tissue or cotton balls. Peri-Wound Care Skin Prep Discharge Instruction: cavelon to macerated wound margins Sween Lotion (Moisturizing lotion) Discharge Instruction: Apply moisturizing lotion as directed Topical Primary Dressing Maxorb Extra Ag+ Alginate Dressing, 4x4.75 (in/in) Discharge Instruction: Apply to excoriated weeping areas Secondary Dressing ABD Pad, 8x10 Discharge Instruction: Apply over primary dressing as directed. LOCHLAIN, BIGNER ([id number]) [id number] Page 8 of 11 Secured With Compression Wrap ThreePress (3 layer compression wrap) Discharge Instruction: Apply three layer compression as directed. Unnaboot w/Calamine, 4x10 (in/yd) Discharge Instruction: Apply Unnaboot on distal foot and top of lower leg Compression Stockings Add-Ons Electronic Signature(s) Signed: 08/12/2023 1:37:16 PM By: Zenaida Deed RN, BSN Entered By: Zenaida Deed on 08/12/2023 11:01:54 -------------------------------------------------------------------------------- Wound Assessment Details Patient Name: Date of Service: [name] 08/12/2023 10:30 A M Medical Record Number: [id number] Patient Account Number: [id number] Date of Birth/Sex: Treating RN: 05-31-1951 (73 y.o. M) Primary Care Javier Mamone: [name] Other Clinician: Referring Rhodes Calvert: Treating Keyvon Herter/Extender: [name] in Treatment: 78 Wound Status Wound Number: 3 Primary  Lymphedema Etiology: Wound Location: Right, Medial Lower Leg Wound Open Wounding Event: Gradually Appeared Status: Date Acquired: 12/07/2021 Comorbid Cataracts, Anemia, Lymphedema, Congestive Heart Failure, Deep Weeks Of Treatment: 78 History: Vein Thrombosis, Hypertension, Peripheral Venous Disease Clustered Wound: No Photos Wound Measurements Length: (cm) 0.9 Width: (cm) 0.7 Depth: (cm) 0.1 Area: (cm) 0.495 Volume: (cm) 0.049 % Reduction in Area: 90.7% % Reduction in Volume: 97.7% Epithelialization: Small (1-33%) Tunneling: No Undermining: No Wound Description Classification: Full Thickness With Exposed Suppo Wound Margin: Distinct, outline attached Exudate Amount: Medium Exudate Type: Serosanguineous Exudate Color: red, brown rt Structures Foul Odor After Cleansing: No Slough/Fibrino Yes Wound Bed Granulation Amount: Medium (34-66%) Exposed Structure Granulation Quality: Red Fascia Exposed: No Necrotic Amount: Medium (34-66%) Fat Layer (Subcutaneous Tissue) Exposed: Yes Necrotic Quality: Adherent Slough Tendon Exposed: No KIROLOS, RAY ([id number]) [id number] Page 9 of 11 Muscle Exposed: No Joint Exposed: No Bone Exposed: No Periwound Skin Texture Texture Color No Abnormalities Noted: No No Abnormalities Noted: No Callus: No Atrophie Blanche: No Crepitus: No Cyanosis: No Excoriation: No Ecchymosis: No Induration: No Erythema: No Rash: No Hemosiderin Staining: Yes Scarring: No Mottled: No Pallor: No Moisture Rubor: No No Abnormalities Noted: No  Dry / Scaly: Yes Temperature / Pain Maceration: No Temperature: No Abnormality Treatment Notes Wound #3 (Lower Leg) Wound Laterality: Right, Medial Cleanser Soap and Water Discharge Instruction: May shower and wash wound with dial antibacterial soap and water prior to dressing change. Wound Cleanser Discharge Instruction: Cleanse the wound with wound cleanser prior to applying a  clean dressing using gauze sponges, not tissue or cotton balls. Peri-Wound Care Skin Prep Discharge Instruction: cavelon to macerated wound margins Sween Lotion (Moisturizing lotion) Discharge Instruction: Apply moisturizing lotion as directed Topical Primary Dressing Maxorb Extra Ag+ Alginate Dressing, 4x4.75 (in/in) Discharge Instruction: Apply to excoriated weeping areas Secondary Dressing ABD Pad, 8x10 Discharge Instruction: Apply over primary dressing as directed. Secured With Compression Wrap ThreePress (3 layer compression wrap) Discharge Instruction: Apply three layer compression as directed. Unnaboot w/Calamine, 4x10 (in/yd) Discharge Instruction: Apply Unnaboot on distal foot and top of lower leg Compression Stockings Add-Ons Electronic Signature(s) Signed: 08/12/2023 1:37:16 PM By: Zenaida Deed RN, BSN Entered By: Zenaida Deed on 08/12/2023 11:02:30 -------------------------------------------------------------------------------- Wound Assessment Details Patient Name: Date of Service: [name] 08/12/2023 10:30 A M Medical Record Number: [id number] Patient Account Number: [id number] Date of Birth/Sex: Treating RN: July 29, 1950 (73 y.o. [name] Primary Care Jex Strausbaugh: [name] Other Clinician: [name] ([id number]) [id number] Page 10 of 11 Referring Simya Tercero: Treating Kameran Lallier/Extender: [name] in Treatment: 78 Wound Status Wound Number: 6 Primary Calcinosis Etiology: Wound Location: Right, Anterior Lower Leg Wound Open Wounding Event: Gradually Appeared Status: Date Acquired: 05/12/2022 Comorbid Cataracts, Anemia, Lymphedema, Congestive Heart Failure, Deep Weeks Of Treatment: 65 History: Vein Thrombosis, Hypertension, Peripheral Venous Disease Clustered Wound: Yes Photos Wound Measurements Length: (cm) 0.1 Width: (cm) 0.1 Depth: (cm) 0.1 Area: (cm) 0.008 Volume: (cm)  0.001 % Reduction in Area: 96.6% % Reduction in Volume: 95.8% Epithelialization: Large (67-100%) Tunneling: No Undermining: No Wound Description Classification: Full Thickness Without Exposed Suppor Wound Margin: Distinct, outline attached Exudate Amount: Small Exudate Type: Serous Exudate Color: amber t Structures Wound Bed Granulation Amount: None Present (0%) Exposed Structure Necrotic Amount: None Present (0%) Fascia Exposed: No Fat Layer (Subcutaneous Tissue) Exposed: Yes Tendon Exposed: No Muscle Exposed: No Joint Exposed: No Bone Exposed: No Periwound Skin Texture Texture Color No Abnormalities Noted: No No Abnormalities Noted: No Callus: No Atrophie Blanche: No Crepitus: No Cyanosis: No Excoriation: No Ecchymosis: No Induration: No Erythema: No Rash: No Hemosiderin Staining: Yes Scarring: No Mottled: No Pallor: No Moisture Rubor: No No Abnormalities Noted: No Dry / Scaly: No Temperature / Pain Maceration: No Temperature: No Abnormality Treatment Notes Wound #6 (Lower Leg) Wound Laterality: Right, Anterior Cleanser Soap and Water Discharge Instruction: May shower and wash wound with dial antibacterial soap and water prior to dressing change. Wound Cleanser Discharge Instruction: Cleanse the wound with wound cleanser prior to applying a clean dressing using gauze sponges, not tissue or cotton balls. Peri-Wound Care DJ, MCCRANIE ([id number]) [id number] Page 11 of 11 Sween Lotion (Moisturizing lotion) Discharge Instruction: Apply moisturizing lotion as directed Topical Primary Dressing Maxorb Extra Ag+ Alginate Dressing, 2x2 (in/in) Discharge Instruction: Apply to wound bed as instructed Secondary Dressing ABD Pad, 8x10 Discharge Instruction: Apply over primary dressing as directed. Secured With Compression Wrap ThreePress (3 layer compression wrap) Discharge Instruction: Apply three layer compression as  directed. Compression Stockings Add-Ons Electronic Signature(s) Signed: 08/12/2023 1:37:16 PM By: Zenaida Deed RN, BSN Entered By: Zenaida Deed on 08/12/2023 11:03:31 -------------------------------------------------------------------------------- Vitals Details Patient Name: Date of Service: [name] 08/12/2023 10:30 A M Medical Record  Number: [id number] Patient Account Number: [id number] Date of Birth/Sex: Treating RN: [birth year] (73 y.o. M) Primary Care Sebastian Lurz: [name] Other Clinician: Referring Delmore Sear: Treating Frankye Schwegel/Extender: [name] in Treatment: 57 Vital Signs Time Taken: 10:21 Temperature (F): 97.7 Height (in): 71 Pulse (bpm): 71 Weight (lbs): 267 Respiratory Rate (breaths/min): 18 Body Mass Index (BMI): 37.2 Blood Pressure (mmHg): 117/61 Reference Range: 80 - 120 mg / dl Electronic Signature(s) Signed: 08/12/2023 1:37:16 PM By: Zenaida Deed RN, BSN Entered By: Zenaida Deed on 08/12/2023 10:59:38

## 2023-08-15 NOTE — Progress Notes (Signed)
[name] ([id number]) [id number] Page 1 of 16 Visit Report for 08/12/2023 Chief Complaint Document Details Patient Name: Date of Service: [name], [name] 08/12/2023 10:30 A M Medical Record Number: [id number] Patient Account Number: [id number] Date of Birth/Sex: Treating RN: 11-Jun-1951 (73 y.o. M) Primary Care Provider: [name] Other Clinician: Referring Provider: Treating Provider/Extender: [name] in Treatment: 68 Information Obtained from: Patient Chief Complaint Patient seen for complaints of Non-Healing Wounds. Electronic Signature(s) Signed: 08/12/2023 11:11:06 AM By: Duanne Guess MD FACS Entered By: Duanne Guess on 08/12/2023 11:11:06 -------------------------------------------------------------------------------- Debridement Details Patient Name: Date of Service: [name] 08/12/2023 10:30 A M Medical Record Number: [id number] Patient Account Number: [id number] Date of Birth/Sex: Treating RN: 06-04-1951 (73 y.o. [name] Primary Care Provider: [name] Other Clinician: Referring Provider: Treating Provider/Extender: [name] in Treatment: 78 Debridement Performed for Assessment: Wound #13 Right,Medial,Posterior Lower Leg Performed By: Physician Duanne Guess, MD The following information was scribed by: Zenaida Deed The information was scribed for: Duanne Guess Debridement Type: Debridement Level of Consciousness (Pre-procedure): Awake and Alert Pre-procedure Verification/Time Out Yes - 11:05 Taken: Start Time: 11:07 Pain Control: Lidocaine 4% T opical Solution Percent of Wound Bed Debrided: 100% T Area Debrided (cm): otal 0.19 Tissue and other material debrided: Viable, Non-Viable, Slough, Subcutaneous, Slough Level: Skin/Subcutaneous Tissue Debridement Description: Excisional Instrument: Curette Bleeding: Minimum Hemostasis  Achieved: Pressure Procedural Pain: 0 Post Procedural Pain: 0 Response to Treatment: Procedure was tolerated well Level of Consciousness (Post- Awake and Alert procedure): Post Debridement Measurements of Total Wound Length: (cm) 0.4 Width: (cm) 0.6 Depth: (cm) 0.1 Volume: (cm) 0.019 Character of Wound/Ulcer Post Debridement: Improved [name], [name] ([id number]) [id number] Page 2 of 16 Post Procedure Diagnosis Same as Pre-procedure Electronic Signature(s) Signed: 08/12/2023 11:49:55 AM By: Duanne Guess MD FACS Signed: 08/12/2023 1:37:16 PM By: Zenaida Deed RN, BSN Entered By: Zenaida Deed on 08/12/2023 11:10:19 -------------------------------------------------------------------------------- Debridement Details Patient Name: Date of Service: [name] 08/12/2023 10:30 A M Medical Record Number: [id number] Patient Account Number: [id number] Date of Birth/Sex: Treating RN: 1951-04-01 (73 y.o. [name], [name] Primary Care Provider: [name] Other Clinician: Referring Provider: Treating Provider/Extender: [name] in Treatment: 78 Debridement Performed for Assessment: Wound #3 Right,Medial Lower Leg Performed By: Physician Duanne Guess, MD Debridement Type: Debridement Level of Consciousness (Pre-procedure): Awake and Alert Pre-procedure Verification/Time Out Yes - 11:05 Taken: Start Time: 11:07 Pain Control: Lidocaine 4% T opical Solution Percent of Wound Bed Debrided: 100% T Area Debrided (cm): otal 0.49 Tissue and other material debrided: Viable, Non-Viable, Slough, Subcutaneous, Slough Level: Skin/Subcutaneous Tissue Debridement Description: Excisional Instrument: Curette Bleeding: Minimum Hemostasis Achieved: Pressure Procedural Pain: 0 Post Procedural Pain: 0 Response to Treatment: Procedure was tolerated well Level of Consciousness (Post- Awake and Alert procedure): Post Debridement  Measurements of Total Wound Length: (cm) 0.9 Width: (cm) 0.7 Depth: (cm) 0.1 Volume: (cm) 0.049 Character of Wound/Ulcer Post Debridement: Improved Post Procedure Diagnosis Same as Pre-procedure Electronic Signature(s) Signed: 08/12/2023 11:49:55 AM By: Duanne Guess MD FACS Signed: 08/12/2023 1:37:16 PM By: Zenaida Deed RN, BSN Entered By: Zenaida Deed on 08/12/2023 11:10:45 -------------------------------------------------------------------------------- HPI Details Patient Name: Date of Service: [name] 08/12/2023 10:30 A M Medical Record Number: [id number] Patient Account Number: [id number] [name] ([id number]) [id number] Page 3 of 16 Date of Birth/Sex: Treating RN: 1951/05/31 (73 y.o. M) Primary Care Provider: Other Clinician: [name] Referring Provider: Treating Provider/Extender: [name] in Treatment: 22 History of Present  Illness HPI Description: ADMISSION 02/09/2022 This is a 73 year old man who recently moved to [place] from [place]. He has a history of of coronary artery disease and prior left popliteal vein DVT . He is not diabetic and does not smoke. 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 He has had multiple spinal surgeries and has limited mobility. He primarily uses a motorized wheelchair. He has had lymphedema for many years and does have lymphedema pumps although he says he does not use them frequently. He has wounds on his bilateral lower extremities. He was receiving care in [place] for these prior to his move. They were apparently packing his wounds with Betadine soaked gauze. He has worn compression wraps in the past but not recently. He did say that they helped tremendously. In clinic today, his right ABI is noncompressible but has good signals; the left ABI was 1.17. No formal vascular studies have been performed. On his left dorsal foot, there is a  circular lesion that exposes the fat layer. There is fairly heavy slough accumulation within the wound, but no significant odor. On his right medial calf, he has multiple pinholes that have slough buildup in them and probe for about 2 to 4 mm in depth. They are draining clear fluid. On his right lateral midfoot, he has ulceration consistent with venous stasis. It is geographic and has slough accumulation. 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 02/17/2022: All of the wounds are little bit larger today and have more slough accumulation. Today, the medial calf openings are larger and probe to something that feels calcified. There is odor coming from his wounds. His vascular studies are scheduled for August 9 and 10. 02-24-2022 upon evaluation today patient presents for follow-up concerning his ongoing issues here with his lower extremities. With that being said he does have significant problems with what appears to be cellulitis unfortunately the PCR culture that was sent last week got crushed by [employer] in transit. With that being said we are going to reobtain a culture today although I am actually to do it on the right medial leg as this seems to be the most inflamed area today where there is some questionable drainage as well. I am going to remove some of the calcium deposits which are loosening obtain a deeper culture from this area. Fortunately I do not see any evidence of active infection systemically which is good news but locally I think we are having a bigger issue here. He does have his appointment next Thursday with vascular. Patient has also been referred to [facility] for further evaluation and treatment of the calcinosis for possible sulfate injections. 03/04/2022: The culture that was performed last week grew out multiple species including Klebsiella, Morganella, and Pseudomonas aeruginosa. He had  been prescribed Bactrim but this was not adequate to cover all of the species and levofloxacin was recommended. He completed the Bactrim but did not pick up the levofloxacin and begin taking it until yesterday. 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 He had arterial studies performed today. He does have evidence of peripheral arterial disease. Results copied here: +-------+-----------+-----------+------------+------------+ ABI/TBIT oday's ABIT oday's TBIPrevious ABIPrevious TBI +-------+-----------+-----------+------------+------------+ Right [place] 0.75    +-------+-----------+-----------+------------+------------+ Left [place] 0.57    +-------+-----------+-----------+------------+------------+ Arterial wall calcification  precludes accurate ankle pressures and ABIs. Summary: Right: Resting right ankle-brachial index indicates noncompressible right lower extremity arteries. The right toe-brachial index is normal. Left: Resting left ankle-brachial index indicates noncompressible left lower extremity arteries. The left toe-brachial index is abnormal. He continues to have further tissue breakdown at the right medial calf and there is ample slough accumulation along with necrotic subcutaneous tissue. The left dorsal foot wound has built up slough, as well. The right medial foot wound is nearly closed with just a light layer of eschar over the surface. The right dorsal lateral foot wound has also accumulated a fair amount of slough and remains quite tender. 03/10/2022: He has been taking the prescribed levofloxacin and has about 3 days left of this. The erythema and induration on his right leg has improved. He continues to experience further breakdown of the right medial calf wound. There is extensive slough and debris  accumulation there. There is heavy slough on his left dorsal foot wound, but no odor or purulent drainage. The right medial foot wound appears to be closed. The right dorsal lateral foot wound also has a fair amount of slough but it is less tender than at our last visit. He still does not have an appointment with dermatology. 03/22/2022: The right anterior tibial wound has closed. The right lateral foot wound is nearly closed with just a little bit of slough. The left dorsal foot wound is smaller and continues to have some slough buildup but less so than at prior visits. There is good granulation tissue underlying the slough. Unfortunately the right medial calf wound continues to deteriorate. It has a foul odor today and a lot of necrotic tissue, the surrounding skin is also erythematous and moist. 03/30/2022: The right lateral foot wound continues to contract and just has a little bit of slough and eschar present. The left dorsal foot wound is also smaller with slough overlying good granulation tissue. The right medial calf wound actually looks quite a bit better today; there is no odor and there is much less necrotic tissue although some remains present. We have been using topical gentamicin and he has been taking oral Augmentin. 04/07/2022: The patient saw dermatology at [facility] last week. Unfortunately, it appears that they did not read any of the documentation that was provided. 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 Overall, it was a highly unsatisfactory consultation. In the meantime, however, we were able to identify compounding pharmacy who could create a topical sodium thiosulfate ointment. The patient has that with him  today. Both of the right foot wounds are now closed. The left dorsal foot wound has contracted but still has a layer of slough on the surface. The medial calf wounds on the right all look a little bit better. They still have heavy slough along with the chunks of calcium. Everything is stained purple. 04/14/2022: The wound on his dorsal left foot is a little bit smaller again today. There is still slough accumulation on the surface. The medial calf wounds have quite a bit less slough accumulation today. His right leg, however, is warm and erythematous, concerning for cellulitis. 04/14/2022: He completed his course of Augmentin yesterday. The medial calf wound sites are much cleaner today and shallower. There is less  fragmented calcium in the wounds. He has a lot of lymphedema fluid-like drainage from these wounds, but no purulent discharge or malodor. The wound on his dorsal left CHAIS, BERGER ([id number]) [id number] Page 4 of 16 foot has also contracted and is shallower. There is a layer of slough over healthy granulation tissue. 05/05/2022: The left dorsal foot wound is smaller today with less slough and more granulation tissue. The right medial calf wounds are shallower, but have extensive slough and some fat necrosis. The drainage has diminished considerably, however. 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 05/12/2022: The left dorsal foot wound continues to contract and fill with granulation tissue. There is slough on the wound surface. The right medial calf wounds also continue to fill with healthy tissue, but there is remaining slough and fat necrosis present. He had a significant increase in his drainage this week and it was a bright yellow-green color. He also had a new wound open over his right anterior tibial surface associated with the spike of  cutaneous calcium. The leg is more red, as well, concerning for infection. 05/19/2022: His culture returned positive for Pseudomonas. He is currently taking a course of oral levofloxacin. We have also been using topical gentamicin mixed in with his sodium thiosulfate. All of the wounds look better this week. 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 There is slough and nonviable subcutaneous tissue still present. The new anterior tibial wound just has a light layer of slough. The dorsal foot wound also has some slough present and appears a little dry today. 05/26/2022: The right medial leg wound continues to improve. It is feeling with granulation tissue, but still accumulates a fairly thick layer of slough on the surface. The more recent anterior tibial wound has remained quite small, but also has some slough on the surface. The left dorsal foot wound has contracted somewhat and has perimeter epithelialization. He completed his oral levofloxacin. 06/02/2022: The left dorsal foot wound continues to improve significantly. There is just a little slough on the wound surface. The right medial leg wound had black drainage, but it looks like silver alginate was applied last week and I theorized that silver sulfide was created with a combination of the silver alginate and the sodium thiosulfate. It did, however, have a bit of an odor to it and extensive slough accumulation. The small anterior tibial wound also had a bit of slough and nonviable subcutaneous tissue present. The skin of his leg was rather macerated, as well. 06/09/2022: The left dorsal foot wound is smaller again this week with just a light layer of slough on the surface. The right medial leg wound looks substantially better. The leg and periwound skin are no longer red and macerated. There is good granulation tissue forming with less slough and nonviable tissue. The anterior tibial wound just has a small amount  of slough accumulation. 06/16/2022: The left dorsal foot wound continues to contract. His wrap slipped a little bit, however, and his foot is more swollen. The right medial leg wound continues to improve. The underlying tissue is more vital-appearing and there is less slough. He does have substantial drainage. The small anterior tibial wound has a bit of slough accumulation. 06/23/2022: The left dorsal foot wound is about half the size as it was last week. There is just some light slough on the surface and some  eschar buildup around the edges. The right anterior leg wound is small with a little slough on the surface. Unfortunately, the right medial leg wound deteriorated fairly substantially. It is malodorous with gray/green/black discoloration. 06/30/2022: The left dorsal foot wound continues to contract. It is nearly closed with just a light layer of slough present. The right anterior leg wound is about the same size and has a small amount of slough on the surface. The right medial leg wound looks a lot better than it did last week. The odor has abated and the tissue is less pulpy and necrotic-looking. There is still a fairly thick layer of slough present. 12/13; the left dorsal foot wound looks very healthy. There was no need to change the dressing here and no debridement. He has a small area on the right anterior lower leg apparently had not changed much in size. The area on the medial leg is the most problematic area this is large with some depth and a completely nonviable surface today. 07/14/2022: The left dorsal foot wound is smaller today with just a little slough and eschar present. The right anterior lower leg wound is about the same without any significant change. The medial leg wound has a black and green surface and is malodorous. No purulent drainage and the periwound skin is intact. Edema control is suboptimal. 07/21/2022: The dorsal foot wound continues to contract and is nearly  closed with just a little bit of slough and eschar on the surface. The right anterior lower leg wound is shallower today but otherwise the dimensions are unchanged. The medial leg wound looks significantly better although it still has a nonviable surface; the odor and black/green surface has resolved. His culture came back with multiple species sensitive to Augmentin, which was prescribed and a very low level of Pseudomonas, which should be handled by the topical gentamicin we have been using. 07/28/2022: The dorsal foot wound is down to just a very tiny opening with a little eschar on the surface. The right anterior tibial wound is about the same with some slough buildup. The right medial leg wound looks quite a bit better this week. There is thick rubbery slough on the surface, but the odor and drainage has improved significantly. 08/04/2022: The dorsal foot wound is almost healed, but the periwound skin has broken down. This appears to be secondary to moisture and adhesive from the absorbent pad. The right anterior tibial wound is shallower. The right medial leg wound continues to improve. It is smaller, still with rubbery slough on the surface. No malodor or purulent drainage. 08/11/2022: The dorsal foot wound is about the same size. It is a little bit dry with some slough accumulation. The right anterior tibial wound is also unchanged. The right medial leg wound is filling in further with good granulation tissue. Still with some slough buildup on the surface. 08/18/2022: His left leg is bright red, hot, and swollen with cellulitis. The dorsal foot wound actually looks quite good and is superficial with just a little bit of slough accumulation. The right anterior tibial wound is unchanged. The right medial leg wound has filled in with good granulation tissue but has copious amounts of drainage. 08/25/2022: His cellulitis has responded nicely to the oral antibiotics. His dorsal foot wound is smaller but  has a fair amount of slough buildup. The right anterior tibial wound remains stable and unchanged. The right medial leg wound has more granulation tissue under a layer of slough, but continues to have copious  drainage. The drainage is actually causing skin irritation and threatens to result in breakdown. 09/01/2022: He continues to have profuse drainage from his wounds which is resulting in tissue maceration on both the dorsum of his left foot and the periwound distal to his medial leg wound. The dorsal foot wound is nearly healed, despite this. The medial right leg wound is filling in with good granulation tissue. We are working on getting home health assistance to change his dressings more frequently to try and control the drainage better. 09/08/2022: We have had success in controlling his drainage. The tissue maceration has improved significantly and his swelling has come down quite markedly. The medial right leg wound is much more superficial and has substantially less nonviable tissue present. The anterior tibial wound is a little bit larger, however with some nonviable tissue present. 09/15/2022: The dorsal foot wound is nearly closed. The anterior tibial wound measures slightly larger today. The medial right leg wound continues to improve quite dramatically with less nonviable tissue and more robust-looking granulation. Edema control is significantly better. 09/22/2022: The left dorsal foot wound remains open, albeit quite tiny. The anterior tibial wound is a little bit larger again today. The medial right leg wound continues to improve with an increase in robust and healthy granulation tissue and less accumulation of nonviable tissue. 09/29/2022: His home health providers wrapped his left leg extremely tightly and he ended up having to cut the wrap off of his foot. This resulted in his foot being very swollen and that the dorsal foot wound is a little bit larger today. The anterior tibial wound is  about the same size with a little slough on the surface. The medial right leg wound continues to fill in. There are very few areas with any significant depth. There is still slough accumulation. 3/13; Patient has a left dorsal foot wound along with a right anterior tibial wound and right medial wound. We are using silver alginate to the left dorsal foot SAJID, KNEIFL ([id number]) [id number] Page 5 of 16 wound, Santyl and sodium thiosulfate compound ointment to the right lower extremity wounds under 4 layer compression. Patient has no issues or complaints today. 10/13/2022: The left dorsal foot wound is a little bit bigger today, but his foot is also a little bit more swollen. The right medial leg wound has filled in more and has substantially less depth. There is still some nonviable tissue present. The small anterior lower leg wound looks about the same. 10/20/2022: The left dorsal foot wound is epithelializing. There does not appear to be much open at this site. There is some eschar around the edges. The right medial leg wound continues to fill in with granulation tissue. The small anterior lower leg wound is a little bit bigger and there is a chunk of calcium protruding. He continues to have fairly copious drainage from the right leg. 10/27/2022: His home health agency failed to show up at all this past week and as a result, the excessive drainage from his right leg wound has resulted in tissue breakdown. His leg is red and irritated. The wounds themselves look about the same. The wound on his left dorsal foot is nearly closed underneath some eschar. 11/03/2022: We continue to have difficulty with his home health agency. He continues to have significant drainage from his right leg wound which has resulted in even further tissue breakdown. He also was not taking his Lasix as prescribed and I think much of the drainage has been his lymphedema  fluid choosing the path of least  resistance. The wound on his left dorsal foot is closed, but in the process of cleaning the site, dry skin was pulled off and he has a new small superficial wound just adjacent to where the dorsal foot wound was. 11/12/2022: His left dorsal foot is completely healed. The right medial leg continues to deteriorate. He is not taking his Lasix as prescribed, nor is he using his lymphedema pumps at all. As a result, he has massive amounts of drainage saturating his dressings and causing further tissue breakdown on his medial leg. Today, his dressings had a foul odor, as well. 11/19/2022: His right medial leg looks even worse today. There is more erythema and moisture related tissue breakdown. The 2 anterior wounds look a little bit better. The culture that I took last week did produce Pseudomonas and he is currently taking levofloxacin for this. 11/26/2022: There has been significant improvement to his right medial leg. It is far less raw and many of the superficial open areas have epithelialized. He continues to cut the foot portion of his wrap and as result his foot is bulbous and swollen compared to the rest of his leg, where edema control is good. The smaller of the 2 anterior tibial wound is closed, while the larger is contracting somewhat with a bit of slough at the base. 12/03/2022: His leg continues to demonstrate improvement. There has been epithelialization of much of the denuded area. He has a new small anterior tibial wound that has opened around a nidus of calcium. Edema control is improved. 12/10/2022: Continued improvement in the periwound skin. The anterior tibial wounds are about the same, but the medial leg wound has improved further. There is less nonviable tissue present. 12/17/2022: Between his visit on Tuesday with the nurse and today, he has had significantly more drainage which has resulted in some periwound tissue maceration. I suspect this correlates somewhat with his diuretic use, as he  says he takes his diuretic religiously on Saturday, Sunday, and Monday and then does not take it after Tuesday due to being out and about and not being close to easy restroom access. I suspect the excess fluid is just simply draining out of his leg. 12/21/2022: This was supposed to be a nurse visit, but his leg has broken down so significantly, that we have converted to a wound care visit. He did take his diuretics over the weekend, but despite this, he has had copious drainage and all of his tissues have broken down substantially from the right medial leg ulcer all the way down to his ankle and even wrapping posterior to this. There is blue-green staining on his dressing, suggesting ongoing presence of Pseudomonas aeruginosa. 12/24/2022: The wound already looks better than it did 2 days ago. He has been taking ciprofloxacin and the Urgo clean dressing we applied did a much better job of wicking the drainage away from his leg. There is slough on the entire surface of the medial leg wound, but the erythema and induration has improved in the past couple of days. 12/31/2022: Continued improvement of the wound. He says that the pain has basically abated since being on ciprofloxacin. Still with fairly heavy drainage and slough accumulation. 01/07/2023: Overall continued improvement. Still with heavy slough buildup, but less drainage. 01/14/2023: There is a little bit of improvement. The skin is still looking a bit excoriated. He has a few more days left of ciprofloxacin. 01/21/2023: There has been significant improvement. The periwound skin  is in much better condition. The ulcers are shallower and actually have some good granulation tissue present under some slough. 01/28/2023: The wound continues to improve. All of the open areas measured smaller today. There is slough accumulation on the surfaces. His drainage has decreased markedly. 02/04/2023: Continued improvement in the wound. Drainage remains significantly  diminished. Slough accumulation is present but to a lesser extent. Periwound skin continues to improve. Edema control is good. 02/18/2023: The heavy drainage persists, although it is was not as significant today as it was at his nurse visit on Tuesday. The drainage is yellow and does not have the blue-green discoloration suggestive of Pseudomonas. The actual wounds on his leg are getting smaller, but the periwound skin is quite excoriated. 02/25/2023: His periwound skin is quite excoriated and he has a lot of clear yellow drainage. The main wounds are filling in further and epithelializing. 03/04/2023: The excoriation of the periwound skin has improved. Most of that area has dried up. The main wounds have less slough in them today. 03/11/2023: The main wounds have accumulated a little bit of slough. The periwound excoriation continues to improve. Unfortunately, he suffered a fall over the previous weekend and has abrasions on his knees bilaterally and on his right wrist and upper arm. In addition, the skin on his left lower leg apparently blistered and opened, leaving several superficial wounds on this extremity. He has been approved for the [facility] and [facility] wound VAC and has the equipment with him today. 03/18/2023: He has extensive excoriation to the periwound skin on the right medial lower leg. The actual wounds themselves are smaller with slough accumulation. The top of his left foot has begun weeping again. Using the [facility] and Press wound VAC, he says that he through 4 canisters. He seems to be very fluid overloaded, without any significant dyspnea. 03/25/2023: The wound to his left anterior tibial surface has healed. The left dorsal foot wound has gotten smaller and has a small area of hypertrophic granulation tissue present. The periwound skin on the right medial lower leg looks slightly better, but still appears excoriated. The actual wounds continue to contract but are still accumulating quite a bit of  slough. The tiny right anterior leg wounds are contracting. 04/01/2023: The left dorsal foot wound got a little bit bigger this week. There is slough accumulation. The periwound skin on the right medial lower leg continues to improve. The main medial right lower leg wounds have a dark gray discoloration and there is a strong odor coming from them. The back of his right lower leg just above the ankle also has a little bit of new breakdown where he is draining edema fluid. The small anterior leg wounds are stable. 04/08/2023: Since starting antibiotics last week, there has been tremendous improvement. The left dorsal foot wound is nearly closed with just a little bit of eschar on the surface. The right medial lower leg is less excoriated and red and he has had substantially less drainage. The amount of slough on the open Brandt, [name] ([id number]) [id number] Page 6 of 16 areas of his wounds has decreased markedly and there is no longer any odor. The back of his right lower leg that was newly open last week has dried up, but is not completely closed yet. 04/15/2023: The improvement continues with significantly less drainage, resulting in less excoriation and irritation. The back of his right lower leg is closed, as is the left dorsal foot wound. The open areas on the right medial  leg are smaller. 04/22/2023: His drainage has diminished significantly. The tissue maceration and excoriation surrounding the right medial leg wound has also improved substantially. There is slough on all of the open wound surfaces. He does have a bit of an odor coming from his wound today, but there is no purulent drainage nor any significant discoloration of the drainage in his VAC. 04/29/2023: He has a new wound on his left lower anterior tibial surface. This was a blister that came up and then ruptured. There is thin slough on the wound surface but no concern for infection. He has had significantly  diminished drainage from his right medial leg ulcers. He was previously going through 4 canisters a day secondary to drainage and this week, he has used only 1 for the entire week. The open areas on his right medial leg are smaller and fewer in number. There is slough on all of the surfaces. No malodor appreciated. 05/06/2023: The wound on his left anterior tibial surface has epithelialized significantly. There is just a small area open in the center. The wound is clean without any slough or other debris accumulation. The drainage on the right leg has almost completely dry. There is slough on the open wound surfaces with significantly more epithelialized between the open sites. He continues on levofloxacin. 05/13/2023: His left lower leg wound is healed. The right lower leg wound continues to improve. The drainage has been minimal and he has not been in the wound VAC for over a week now. There is better skin filling between the open areas and less slough accumulation on the open surfaces. 05/20/2023: The anterior leg wounds are closing up and there is no slough on the open areas that remain. His medial leg wound was measured longer today, but this appears to include some stuck on silver alginate and dry eschar, rather than the actual open wound areas. There is slough accumulation on the open surfaces. Edema control is good and his drainage has not been excessive. 05/27/2023: His wrap slipped up on his foot and he has a new small ulcer on the dorsal aspect of his right foot. The other wounds are basically stable with the usual thick slough accumulation. Drainage remains well-controlled. Edema control is good. 06/03/2023: The dorsal foot ulcer is healed. The other wounds actually are quite a bit smaller this week. There is less depth to the large medial leg ulcers. Edema control is excellent. 06/10/2023: He has had a new wound open on his left lower leg, secondary to friction from his compression wrap.  The medial leg ulcers are all smaller; the anterior wounds were measured larger, but this appears to be due to some minor moisture-related breakdown. He continues on levofloxacin. 06/17/2023: The wound on his left lower leg is nearly closed. There is just a small open area that remains underneath some eschar and dried silver alginate. The medial leg ulcers continue to improve with greater areas of epithelialization between each of the open sites. There is some slough accumulation here. No real change to the anterior leg wounds. Unfortunately, when his wrap was being removed, he was cut with the nurse's scissors so he has a new small wound on his upper right lower leg just below the knee. 07/15/2023: The left lower leg wound is healed. The medial right leg ulcers are significantly smaller since the last time I saw them. There is a wide band of healthy epithelialized tissue between them. The anterior right leg wound is quite a bit smaller, as  well. The wound that occurred as a result of scissor injury several weeks ago has also healed. Edema control is good. 07/29/2023: The right medial leg ulcers have contracted to the point that we need to separate them into 2 different sites. There is some slough accumulation on the surface of each. The anterior right leg wound is down to just a pinhole. Edema control is good. 08/12/2023: Both medial leg ulcers are quite a bit smaller this week. They are shallower and I do not appreciate any calcinosis. There is some slough on the surface. The anterior tibial wound is just a pinhole. The periwound skin is in excellent condition. Electronic Signature(s) Signed: 08/12/2023 11:11:48 AM By: Duanne Guess MD FACS Entered By: Duanne Guess on 08/12/2023 11:11:47 -------------------------------------------------------------------------------- Physical Exam Details Patient Name: Date of Service: ANTONNIO, MCDAVID [name] 08/12/2023 10:30 A M Medical Record Number:  [id number] Patient Account Number: [id number] Date of Birth/Sex: Treating RN: [birth year] (73 y.o. M) Primary Care Provider: [name] Other Clinician: Referring Provider: Treating Provider/Extender: [name] in Treatment: 68 Constitutional . . . . no acute distress. Respiratory Normal work of breathing on room air.. Notes 08/12/2023: Both medial leg ulcers are quite a bit smaller this week. They are shallower and I do not appreciate any calcinosis. There is some slough on the surface. The anterior tibial wound is just a pinhole. The periwound skin is in excellent condition. Electronic Signature(s) Signed: 08/12/2023 11:12:20 AM By: Duanne Guess MD FACS Newcomb, [name] ([id number]) [id number] Page 7 of 16 Signed: 08/12/2023 11:12:20 AM By: Duanne Guess MD FACS Entered By: Duanne Guess on 08/12/2023 11:12:20 -------------------------------------------------------------------------------- Physician Orders Details Patient Name: Date of Service: [name] 08/12/2023 10:30 A M Medical Record Number: [id number] Patient Account Number: [id number] Date of Birth/Sex: Treating RN: May 13, 1951 (73 y.o. 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 - Dr. [name] Return A Anesthetic (In clinic) Topical Lidocaine 4% applied to wound bed Bathing/ Shower/ Hygiene May shower with protection but do not get wound dressing(s) wet. Protect dressing(s) with water repellant cover (for example, large plastic bag) or a cast cover and may then take shower. Lymphedema Treatment Plan - Exercise, Compression and Elevation Bilateral Lower Extremities Exercise daily as tolerated. (Walking, ROM, Calf Pumps and Toe Taps) Compression Garments, Class II. Wear daily when out of bed. May remove at bedtime Compression Wraps as ordered Elevate legs 30 - 60 minutes at or above heart level at least 3 - 4 times daily as able/tolerated Avoid standing for long periods and elevate leg(s) parallel to the floor when sitting Use Pneumatic Compression Device on leg(s) 2-3 times a day for 45-60 minutes Wound Treatment Wound #13 - Lower Leg Wound Laterality: Right, Medial, Posterior Cleanser: Soap and Water 1 x Per Week/30 Days Discharge Instructions: May shower and wash wound with dial antibacterial soap and water prior to dressing change. Cleanser: Wound Cleanser 1 x Per Week/30 Days Discharge  Instructions: Cleanse the wound with wound cleanser prior to applying a clean dressing using gauze sponges, not tissue or cotton balls. Peri-Wound Care: Skin Prep 1 x Per Week/30 Days Discharge Instructions: cavelon to macerated wound margins Peri-Wound Care: Sween Lotion (Moisturizing lotion) 1 x Per Week/30 Days Discharge Instructions: Apply moisturizing lotion as directed Prim Dressing: Maxorb Extra Ag+ Alginate Dressing, 4x4.75 (in/in) 1 x Per Week/30 Days ary Discharge Instructions: Apply to excoriated weeping areas Secondary Dressing: ABD Pad, 8x10 1 x Per Week/30 Days Discharge Instructions: Apply over primary dressing as directed. Compression Wrap: ThreePress (3 layer compression wrap) 1 x Per  Week/30 Days Discharge Instructions: Apply three layer compression as directed. JAMESPAUL, CEDERQUIST ([id number]) [id number] Page 8 of 16 Compression Wrap: Unnaboot w/Calamine, 4x10 (in/yd) 1 x Per Week/30 Days Discharge Instructions: Apply Unnaboot on distal foot and top of lower leg Wound #3 - Lower Leg Wound Laterality: Right, Medial Cleanser: Soap and Water 1 x Per Week/30 Days Discharge Instructions: May shower and wash wound with dial antibacterial soap and water prior to dressing change. Cleanser: Wound Cleanser 1 x Per Week/30 Days Discharge Instructions: Cleanse the wound with wound cleanser prior to applying a clean dressing using gauze sponges, not tissue or cotton balls. Peri-Wound Care: Skin Prep 1 x Per Week/30 Days Discharge Instructions: cavelon to macerated wound margins Peri-Wound Care: Sween Lotion (Moisturizing lotion) 1 x Per Week/30 Days Discharge Instructions: Apply moisturizing lotion as directed Prim Dressing: Maxorb Extra Ag+ Alginate Dressing, 4x4.75 (in/in) 1 x Per Week/30 Days ary Discharge Instructions: Apply to excoriated weeping areas Secondary Dressing: ABD Pad, 8x10 1 x Per Week/30 Days Discharge Instructions: Apply over primary dressing as directed. Compression Wrap: ThreePress (3 layer compression wrap) 1 x Per Week/30 Days Discharge Instructions: Apply three layer compression as directed. Compression Wrap: Unnaboot w/Calamine, 4x10 (in/yd) 1 x Per Week/30 Days Discharge Instructions: Apply Unnaboot on distal foot and top of lower leg Wound #6 - Lower Leg Wound Laterality: Right, Anterior Cleanser: Soap and Water 1 x Per Week/30 Days Discharge Instructions: May shower and wash wound with dial antibacterial soap and water prior to dressing change. Cleanser: Wound Cleanser 1 x Per Week/30 Days Discharge Instructions: Cleanse the wound with wound cleanser prior to applying a clean dressing using gauze sponges, not tissue or cotton  balls. Peri-Wound Care: Sween Lotion (Moisturizing lotion) 1 x Per Week/30 Days Discharge Instructions: Apply moisturizing lotion as directed Prim Dressing: Maxorb Extra Ag+ Alginate Dressing, 2x2 (in/in) 1 x Per Week/30 Days ary Discharge Instructions: Apply to wound bed as instructed Secondary Dressing: ABD Pad, 8x10 1 x Per Week/30 Days Discharge Instructions: Apply over primary dressing as directed. Compression Wrap: ThreePress (3 layer compression wrap) 1 x Per Week/30 Days Discharge Instructions: Apply three layer compression as directed. Electronic Signature(s) Signed: 08/12/2023 11:49:55 AM By: Duanne Guess MD FACS Entered By: Duanne Guess on 08/12/2023 11:12:53 -------------------------------------------------------------------------------- Problem List Details Patient Name: Date of Service: [name] 08/12/2023 10:30 A M Medical Record Number: [id number] Patient Account Number: [id number] Date of Birth/Sex: Treating RN: 12-Jan-1951 (73 y.o. 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 02/09/2022 No Yes I89.0 Lymphedema, not elsewhere classified 02/09/2022 No Yes I87.2 Venous insufficiency (  chronic) (peripheral) 02/09/2022 No Yes Z86.718 Personal history of other venous thrombosis and embolism 02/09/2022 No Yes I10 Essential (primary) hypertension 02/09/2022 No Yes I25.10 Atherosclerotic heart disease of native coronary artery without angina pectoris 02/09/2022 No Yes Inactive Problems ICD-10 Code Description Active Date Inactive Date L97.822  Non-pressure chronic ulcer of other part of left lower leg with fat layer exposed 06/10/2023 04/29/2023 Resolved Problems ICD-10 Code Description Active Date Resolved Date L97.821 Non-pressure chronic ulcer of other part of left lower leg limited to breakdown of skin 03/11/2023 03/11/2023 L98.492 Non-pressure chronic ulcer of skin of other sites with fat layer exposed 03/11/2023 03/11/2023 L97.521 Non-pressure chronic ulcer of other part of left foot limited to breakdown of skin 03/18/2023 02/09/2022 L97.512 Non-pressure chronic ulcer of other part of right foot with fat layer exposed 05/27/2023 02/17/2022 Electronic Signature(s) Signed: 08/12/2023 11:10:49 AM By: Duanne Guess MD FACS Entered By: Duanne Guess on 08/12/2023 11:10:49 -------------------------------------------------------------------------------- Progress Note Details Patient Name: Date of Service: [name] 08/12/2023 10:30 A M Medical Record Number: [id number] Patient Account Number: [id number] Date of Birth/Sex: Treating RN: Nov 19, 1950 (73 y.o. M) Primary Care Provider: [name] Other Clinician: Jackson [name] ([id number]) [id number] Page 10 of 16 Referring Provider: Treating Provider/Extender: [name] in Treatment: 78 Subjective Chief Complaint Information obtained from Patient Patient seen for complaints of Non-Healing Wounds. History of Present Illness (HPI) ADMISSION 02/09/2022 This is a 73 year old man who recently moved to [place] from [place]. He has a history of of coronary artery disease and prior left popliteal vein DVT . He is not diabetic and does not smoke. 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 He has had multiple spinal surgeries and has limited mobility. He primarily uses a motorized wheelchair. He has had lymphedema for many years and does have lymphedema pumps although he says he does not use them frequently. He  has wounds on his bilateral lower extremities. He was receiving care in [place] for these prior to his move. They were apparently packing his wounds with Betadine soaked gauze. He has worn compression wraps in the past but not recently. He did say that they helped tremendously. In clinic today, his right ABI is noncompressible but has good signals; the left ABI was 1.17. No formal vascular studies have been performed. On his left dorsal foot, there is a circular lesion that exposes the fat layer. There is fairly heavy slough accumulation within the wound, but no significant odor. On his right medial calf, he has multiple pinholes that have slough buildup in them and probe for about 2 to 4 mm in depth. They are draining clear fluid. On his right lateral midfoot, he has ulceration consistent with venous stasis. It is geographic and has slough accumulation. 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 02/17/2022: All of the wounds are little bit larger today and have more slough accumulation. Today, the medial calf openings are larger and probe to something that feels calcified. There is odor coming from his wounds. His vascular studies are scheduled for August 9 and 10. 02-24-2022 upon evaluation today patient presents for follow-up concerning his ongoing issues here with his lower extremities. With that being said he does have significant problems with what appears to be cellulitis unfortunately the PCR culture that was sent last week got crushed by [employer] in transit. With that being said we are going to reobtain a culture today although  I am actually to do it on the right medial leg as this seems to be the most inflamed area today where there is some questionable drainage as well. I am going to remove some of the calcium deposits which are loosening obtain a deeper culture from this area. Fortunately I do not see any  evidence of active infection systemically which is good news but locally I think we are having a bigger issue here. He does have his appointment next Thursday with vascular. Patient has also been referred to [facility] for further evaluation and treatment of the calcinosis for possible sulfate injections. 03/04/2022: The culture that was performed last week grew out multiple species including Klebsiella, Morganella, and Pseudomonas aeruginosa. He had been prescribed Bactrim but this was not adequate to cover all of the species and levofloxacin was recommended. He completed the Bactrim but did not pick up the levofloxacin and begin taking it until yesterday. 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 He had arterial studies performed today. He does have evidence of peripheral arterial disease. Results copied here: +-------+-----------+-----------+------------+------------+ ABI/TBIT oday's ABIT oday's TBIPrevious ABIPrevious TBI +-------+-----------+-----------+------------+------------+ Right Schuyler 0.75    +-------+-----------+-----------+------------+------------+ Left Allyn 0.57    +-------+-----------+-----------+------------+------------+ Arterial wall calcification precludes accurate ankle pressures and ABIs. Summary: Right: Resting right ankle-brachial index indicates noncompressible right lower extremity arteries. The right toe-brachial index is normal. Left: Resting left ankle-brachial index indicates noncompressible left lower extremity arteries. The left toe-brachial index is abnormal. He continues to have further tissue breakdown at the right medial calf and there is ample slough accumulation along with necrotic subcutaneous tissue. The left dorsal foot wound has built up slough, as well. The right medial  foot wound is nearly closed with just a light layer of eschar over the surface. The right dorsal lateral foot wound has also accumulated a fair amount of slough and remains quite tender. 03/10/2022: He has been taking the prescribed levofloxacin and has about 3 days left of this. The erythema and induration on his right leg has improved. He continues to experience further breakdown of the right medial calf wound. There is extensive slough and debris accumulation there. There is heavy slough on his left dorsal foot wound, but no odor or purulent drainage. The right medial foot wound appears to be closed. The right dorsal lateral foot wound also has a fair amount of slough but it is less tender than at our last visit. He still does not have an appointment with dermatology. 03/22/2022: The right anterior tibial wound has closed. The right lateral foot wound is nearly closed with just a little bit of slough. The left dorsal foot wound is smaller and continues to have some slough buildup but less so than at prior visits. There is good granulation tissue underlying the slough. Unfortunately the right medial calf wound continues to deteriorate. It has a foul odor today and a lot of necrotic tissue, the surrounding skin is also erythematous and moist. 03/30/2022: The right lateral foot wound continues to contract and just has a little bit of slough and eschar present. The left dorsal foot wound is also smaller with slough overlying good granulation tissue. The right medial calf wound actually looks quite a bit better today; there is no odor and there is much less necrotic tissue although some remains present. We have been using topical gentamicin and he has  been taking oral Augmentin. 04/07/2022: The patient saw dermatology at [facility] last week. Unfortunately, it appears that they did not read any of the documentation that was provided. 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 Overall, it was a highly unsatisfactory consultation. In the meantime, however, we were able to identify compounding pharmacy who could create a topical sodium thiosulfate ointment. The patient has that with him today. Both of the right foot wounds are now closed. The left dorsal foot wound has contracted but still has a layer of slough on the surface. The medial calf wounds on the right all look a little bit better. They still have heavy slough along with the chunks of calcium. Everything is stained purple. 04/14/2022: The wound on his dorsal left foot is a little bit smaller again today. There is still slough accumulation on the surface. The medial calf wounds have quite a bit less slough accumulation today. His right leg, however, is warm and erythematous, concerning for cellulitis. DIMA, HOAK ([id number]) [id number] Page 11 of 16 04/14/2022: He completed his course of Augmentin yesterday. The medial calf wound sites are much cleaner today and shallower. There is less fragmented calcium in the wounds. He has a lot of lymphedema fluid-like drainage from these wounds, but no purulent discharge or malodor. The wound on his dorsal left foot has also contracted and is shallower. There is a layer of slough over healthy granulation tissue. 05/05/2022: The left dorsal foot wound is smaller today with less slough and more granulation tissue. The right medial calf wounds are shallower, but have extensive slough and some fat necrosis. The drainage has diminished considerably, however. 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 05/12/2022: The left dorsal foot wound continues to contract and fill with granulation tissue. There is slough on the wound surface. The right medial calf wounds also continue to fill with healthy tissue, but there is remaining slough and fat necrosis present. He had a significant increase in his drainage this week and it was a bright yellow-green color. He also had a new wound open over his right anterior tibial surface associated with the spike of cutaneous calcium. The leg is more red, as well, concerning for infection. 05/19/2022: His culture returned positive for Pseudomonas. He is currently taking a course of oral levofloxacin. We have also been using topical gentamicin mixed in with his sodium thiosulfate. All of the wounds look better this week. 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 There is slough and nonviable subcutaneous tissue still present. The new anterior tibial wound just has a light layer of slough. The dorsal foot wound also has some slough present and appears a little dry today. 05/26/2022: The right medial leg wound continues to improve. It is feeling with granulation tissue, but still accumulates a fairly thick layer of slough on the surface. The more recent anterior tibial wound has remained quite small, but also has some slough on the surface. The left dorsal foot wound has contracted somewhat and has perimeter epithelialization. He completed his oral levofloxacin. 06/02/2022: The left dorsal foot wound continues to improve significantly. There is just  a little slough on the wound surface. The right medial leg wound had black drainage, but it looks like silver alginate was applied last week and I theorized that silver sulfide was created with a combination of the silver alginate and the sodium thiosulfate. It did, however, have a bit of an odor to it and extensive slough accumulation. The small anterior tibial wound also had a bit of  slough and nonviable subcutaneous tissue present. The skin of his leg was rather macerated, as well. 06/09/2022: The left dorsal foot wound is smaller again this week with just a light layer of slough on the surface. The right medial leg wound looks substantially better. The leg and periwound skin are no longer red and macerated. There is good granulation tissue forming with less slough and nonviable tissue. The anterior tibial wound just has a small amount of slough accumulation. 06/16/2022: The left dorsal foot wound continues to contract. His wrap slipped a little bit, however, and his foot is more swollen. The right medial leg wound continues to improve. The underlying tissue is more vital-appearing and there is less slough. He does have substantial drainage. The small anterior tibial wound has a bit of slough accumulation. 06/23/2022: The left dorsal foot wound is about half the size as it was last week. There is just some light slough on the surface and some eschar buildup around the edges. The right anterior leg wound is small with a little slough on the surface. Unfortunately, the right medial leg wound deteriorated fairly substantially. It is malodorous with gray/green/black discoloration. 06/30/2022: The left dorsal foot wound continues to contract. It is nearly closed with just a light layer of slough present. The right anterior leg wound is about the same size and has a small amount of slough on the surface. The right medial leg wound looks a lot better than it did last week. The odor has abated and the tissue is less pulpy and necrotic-looking. There is still a fairly thick layer of slough present. 12/13; the left dorsal foot wound looks very healthy. There was no need to change the dressing here and no debridement. He has a small area on the right anterior lower leg apparently had not changed much in size. The area on the medial leg is the most problematic area this is large with some  depth and a completely nonviable surface today. 07/14/2022: The left dorsal foot wound is smaller today with just a little slough and eschar present. The right anterior lower leg wound is about the same without any significant change. The medial leg wound has a black and green surface and is malodorous. No purulent drainage and the periwound skin is intact. Edema control is suboptimal. 07/21/2022: The dorsal foot wound continues to contract and is nearly closed with just a little bit of slough and eschar on the surface. The right anterior lower leg wound is shallower today but otherwise the dimensions are unchanged. The medial leg wound looks significantly better although it still has a nonviable surface; the odor and black/green surface has resolved. His culture came back with multiple species sensitive to Augmentin, which was prescribed and a very low level of Pseudomonas, which should be handled by the topical gentamicin we have been using. 07/28/2022: The dorsal foot wound is down to just a very tiny opening with a little eschar on the surface. The right anterior tibial wound is about the same with some slough buildup. The right medial leg wound looks quite a bit better  this week. There is thick rubbery slough on the surface, but the odor and drainage has improved significantly. 08/04/2022: The dorsal foot wound is almost healed, but the periwound skin has broken down. This appears to be secondary to moisture and adhesive from the absorbent pad. The right anterior tibial wound is shallower. The right medial leg wound continues to improve. It is smaller, still with rubbery slough on the surface. No malodor or purulent drainage. 08/11/2022: The dorsal foot wound is about the same size. It is a little bit dry with some slough accumulation. The right anterior tibial wound is also unchanged. The right medial leg wound is filling in further with good granulation tissue. Still with some slough buildup on  the surface. 08/18/2022: His left leg is bright red, hot, and swollen with cellulitis. The dorsal foot wound actually looks quite good and is superficial with just a little bit of slough accumulation. The right anterior tibial wound is unchanged. The right medial leg wound has filled in with good granulation tissue but has copious amounts of drainage. 08/25/2022: His cellulitis has responded nicely to the oral antibiotics. His dorsal foot wound is smaller but has a fair amount of slough buildup. The right anterior tibial wound remains stable and unchanged. The right medial leg wound has more granulation tissue under a layer of slough, but continues to have copious drainage. The drainage is actually causing skin irritation and threatens to result in breakdown. 09/01/2022: He continues to have profuse drainage from his wounds which is resulting in tissue maceration on both the dorsum of his left foot and the periwound distal to his medial leg wound. The dorsal foot wound is nearly healed, despite this. The medial right leg wound is filling in with good granulation tissue. We are working on getting home health assistance to change his dressings more frequently to try and control the drainage better. 09/08/2022: We have had success in controlling his drainage. The tissue maceration has improved significantly and his swelling has come down quite markedly. The medial right leg wound is much more superficial and has substantially less nonviable tissue present. The anterior tibial wound is a little bit larger, however with some nonviable tissue present. 09/15/2022: The dorsal foot wound is nearly closed. The anterior tibial wound measures slightly larger today. The medial right leg wound continues to improve quite dramatically with less nonviable tissue and more robust-looking granulation. Edema control is significantly better. 09/22/2022: The left dorsal foot wound remains open, albeit quite tiny. The anterior  tibial wound is a little bit larger again today. The medial right leg wound continues to improve with an increase in robust and healthy granulation tissue and less accumulation of nonviable tissue. 09/29/2022: His home health providers wrapped his left leg extremely tightly and he ended up having to cut the wrap off of his foot. This resulted in his foot being very swollen and that the dorsal foot wound is a little bit larger today. The anterior tibial wound is about the same size with a little slough on the surface. MOSI, MONTREUIL ([id number]) [id number] Page 12 of 16 The medial right leg wound continues to fill in. There are very few areas with any significant depth. There is still slough accumulation. 3/13; Patient has a left dorsal foot wound along with a right anterior tibial wound and right medial wound. We are using silver alginate to the left dorsal foot wound, Santyl and sodium thiosulfate compound ointment to the right lower extremity wounds under 4 layer compression. Patient  has no issues or complaints today. 10/13/2022: The left dorsal foot wound is a little bit bigger today, but his foot is also a little bit more swollen. The right medial leg wound has filled in more and has substantially less depth. There is still some nonviable tissue present. The small anterior lower leg wound looks about the same. 10/20/2022: The left dorsal foot wound is epithelializing. There does not appear to be much open at this site. There is some eschar around the edges. The right medial leg wound continues to fill in with granulation tissue. The small anterior lower leg wound is a little bit bigger and there is a chunk of calcium protruding. He continues to have fairly copious drainage from the right leg. 10/27/2022: His home health agency failed to show up at all this past week and as a result, the excessive drainage from his right leg wound has resulted in tissue breakdown. His leg is red  and irritated. The wounds themselves look about the same. The wound on his left dorsal foot is nearly closed underneath some eschar. 11/03/2022: We continue to have difficulty with his home health agency. He continues to have significant drainage from his right leg wound which has resulted in even further tissue breakdown. He also was not taking his Lasix as prescribed and I think much of the drainage has been his lymphedema fluid choosing the path of least resistance. The wound on his left dorsal foot is closed, but in the process of cleaning the site, dry skin was pulled off and he has a new small superficial wound just adjacent to where the dorsal foot wound was. 11/12/2022: His left dorsal foot is completely healed. The right medial leg continues to deteriorate. He is not taking his Lasix as prescribed, nor is he using his lymphedema pumps at all. As a result, he has massive amounts of drainage saturating his dressings and causing further tissue breakdown on his medial leg. Today, his dressings had a foul odor, as well. 11/19/2022: His right medial leg looks even worse today. There is more erythema and moisture related tissue breakdown. The 2 anterior wounds look a little bit better. The culture that I took last week did produce Pseudomonas and he is currently taking levofloxacin for this. 11/26/2022: There has been significant improvement to his right medial leg. It is far less raw and many of the superficial open areas have epithelialized. He continues to cut the foot portion of his wrap and as result his foot is bulbous and swollen compared to the rest of his leg, where edema control is good. The smaller of the 2 anterior tibial wound is closed, while the larger is contracting somewhat with a bit of slough at the base. 12/03/2022: His leg continues to demonstrate improvement. There has been epithelialization of much of the denuded area. He has a new small anterior tibial wound that has opened around  a nidus of calcium. Edema control is improved. 12/10/2022: Continued improvement in the periwound skin. The anterior tibial wounds are about the same, but the medial leg wound has improved further. There is less nonviable tissue present. 12/17/2022: Between his visit on Tuesday with the nurse and today, he has had significantly more drainage which has resulted in some periwound tissue maceration. I suspect this correlates somewhat with his diuretic use, as he says he takes his diuretic religiously on Saturday, Sunday, and Monday and then does not take it after Tuesday due to being out and about and not being close  to easy restroom access. I suspect the excess fluid is just simply draining out of his leg. 12/21/2022: This was supposed to be a nurse visit, but his leg has broken down so significantly, that we have converted to a wound care visit. He did take his diuretics over the weekend, but despite this, he has had copious drainage and all of his tissues have broken down substantially from the right medial leg ulcer all the way down to his ankle and even wrapping posterior to this. There is blue-green staining on his dressing, suggesting ongoing presence of Pseudomonas aeruginosa. 12/24/2022: The wound already looks better than it did 2 days ago. He has been taking ciprofloxacin and the Urgo clean dressing we applied did a much better job of wicking the drainage away from his leg. There is slough on the entire surface of the medial leg wound, but the erythema and induration has improved in the past couple of days. 12/31/2022: Continued improvement of the wound. He says that the pain has basically abated since being on ciprofloxacin. Still with fairly heavy drainage and slough accumulation. 01/07/2023: Overall continued improvement. Still with heavy slough buildup, but less drainage. 01/14/2023: There is a little bit of improvement. The skin is still looking a bit excoriated. He has a few more days left of  ciprofloxacin. 01/21/2023: There has been significant improvement. The periwound skin is in much better condition. The ulcers are shallower and actually have some good granulation tissue present under some slough. 01/28/2023: The wound continues to improve. All of the open areas measured smaller today. There is slough accumulation on the surfaces. His drainage has decreased markedly. 02/04/2023: Continued improvement in the wound. Drainage remains significantly diminished. Slough accumulation is present but to a lesser extent. Periwound skin continues to improve. Edema control is good. 02/18/2023: The heavy drainage persists, although it is was not as significant today as it was at his nurse visit on Tuesday. The drainage is yellow and does not have the blue-green discoloration suggestive of Pseudomonas. The actual wounds on his leg are getting smaller, but the periwound skin is quite excoriated. 02/25/2023: His periwound skin is quite excoriated and he has a lot of clear yellow drainage. The main wounds are filling in further and epithelializing. 03/04/2023: The excoriation of the periwound skin has improved. Most of that area has dried up. The main wounds have less slough in them today. 03/11/2023: The main wounds have accumulated a little bit of slough. The periwound excoriation continues to improve. Unfortunately, he suffered a fall over the previous weekend and has abrasions on his knees bilaterally and on his right wrist and upper arm. In addition, the skin on his left lower leg apparently blistered and opened, leaving several superficial wounds on this extremity. He has been approved for the [facility] wound VAC and has the equipment with him today. 03/18/2023: He has extensive excoriation to the periwound skin on the right medial lower leg. The actual wounds themselves are smaller with slough accumulation. The top of his left foot has begun weeping again. Using the [facility] wound VAC, he says  that he through 4 canisters. He seems to be very fluid overloaded, without any significant dyspnea. 03/25/2023: The wound to his left anterior tibial surface has healed. The left dorsal foot wound has gotten smaller and has a small area of hypertrophic granulation tissue present. The periwound skin on the right medial lower leg looks slightly better, but still appears excoriated. The actual wounds continue to  contract but are still accumulating quite a bit of slough. The tiny right anterior leg wounds are contracting. 04/01/2023: The left dorsal foot wound got a little bit bigger this week. There is slough accumulation. The periwound skin on the right medial lower leg continues to improve. The main medial right lower leg wounds have a dark gray discoloration and there is a strong odor coming from them. The back of his right lower leg just above the ankle also has a little bit of new breakdown where he is draining edema fluid. The small anterior leg wounds are stable. MAYRA, PUHL ([id number]) [id number] Page 13 of 16 04/08/2023: Since starting antibiotics last week, there has been tremendous improvement. The left dorsal foot wound is nearly closed with just a little bit of eschar on the surface. The right medial lower leg is less excoriated and red and he has had substantially less drainage. The amount of slough on the open areas of his wounds has decreased markedly and there is no longer any odor. The back of his right lower leg that was newly open last week has dried up, but is not completely closed yet. 04/15/2023: The improvement continues with significantly less drainage, resulting in less excoriation and irritation. The back of his right lower leg is closed, as is the left dorsal foot wound. The open areas on the right medial leg are smaller. 04/22/2023: His drainage has diminished significantly. The tissue maceration and excoriation surrounding the right medial leg wound  has also improved substantially. There is slough on all of the open wound surfaces. He does have a bit of an odor coming from his wound today, but there is no purulent drainage nor any significant discoloration of the drainage in his VAC. 04/29/2023: He has a new wound on his left lower anterior tibial surface. This was a blister that came up and then ruptured. There is thin slough on the wound surface but no concern for infection. He has had significantly diminished drainage from his right medial leg ulcers. He was previously going through 4 canisters a day secondary to drainage and this week, he has used only 1 for the entire week. The open areas on his right medial leg are smaller and fewer in number. There is slough on all of the surfaces. No malodor appreciated. 05/06/2023: The wound on his left anterior tibial surface has epithelialized significantly. There is just a small area open in the center. The wound is clean without any slough or other debris accumulation. The drainage on the right leg has almost completely dry. There is slough on the open wound surfaces with significantly more epithelialized between the open sites. He continues on levofloxacin. 05/13/2023: His left lower leg wound is healed. The right lower leg wound continues to improve. The drainage has been minimal and he has not been in the wound VAC for over a week now. There is better skin filling between the open areas and less slough accumulation on the open surfaces. 05/20/2023: The anterior leg wounds are closing up and there is no slough on the open areas that remain. His medial leg wound was measured longer today, but this appears to include some stuck on silver alginate and dry eschar, rather than the actual open wound areas. There is slough accumulation on the open surfaces. Edema control is good and his drainage has not been excessive. 05/27/2023: His wrap slipped up on his foot and he has a new small ulcer on the dorsal  aspect of his right foot.  The other wounds are basically stable with the usual thick slough accumulation. Drainage remains well-controlled. Edema control is good. 06/03/2023: The dorsal foot ulcer is healed. The other wounds actually are quite a bit smaller this week. There is less depth to the large medial leg ulcers. Edema control is excellent. 06/10/2023: He has had a new wound open on his left lower leg, secondary to friction from his compression wrap. The medial leg ulcers are all smaller; the anterior wounds were measured larger, but this appears to be due to some minor moisture-related breakdown. He continues on levofloxacin. 06/17/2023: The wound on his left lower leg is nearly closed. There is just a small open area that remains underneath some eschar and dried silver alginate. The medial leg ulcers continue to improve with greater areas of epithelialization between each of the open sites. There is some slough accumulation here. No real change to the anterior leg wounds. Unfortunately, when his wrap was being removed, he was cut with the nurse's scissors so he has a new small wound on his upper right lower leg just below the knee. 07/15/2023: The left lower leg wound is healed. The medial right leg ulcers are significantly smaller since the last time I saw them. There is a wide band of healthy epithelialized tissue between them. The anterior right leg wound is quite a bit smaller, as well. The wound that occurred as a result of scissor injury several weeks ago has also healed. Edema control is good. 07/29/2023: The right medial leg ulcers have contracted to the point that we need to separate them into 2 different sites. There is some slough accumulation on the surface of each. The anterior right leg wound is down to just a pinhole. Edema control is good. 08/12/2023: Both medial leg ulcers are quite a bit smaller this week. They are shallower and I do not appreciate any calcinosis. There is some  slough on the surface. The anterior tibial wound is just a pinhole. The periwound skin is in excellent condition. Objective Constitutional no acute distress. Vitals Time Taken: 10:21 AM, Height: 71 in, Weight: 267 lbs, BMI: 37.2, Temperature: 97.7 F, Pulse: 71 bpm, Respiratory Rate: 18 breaths/min, Blood Pressure: 117/61 mmHg. Respiratory Normal work of breathing on room air.. General Notes: 08/12/2023: Both medial leg ulcers are quite a bit smaller this week. They are shallower and I do not appreciate any calcinosis. There is some slough on the surface. The anterior tibial wound is just a pinhole. The periwound skin is in excellent condition. Integumentary (Hair, Skin) Wound #13 status is Open. Original cause of wound was Gradually Appeared. The date acquired was: 07/29/2023. The wound has been in treatment 2 weeks. The wound is located on the Right,Medial,Posterior Lower Leg. The wound measures 0.4cm length x 0.6cm width x 0.1cm depth; 0.188cm^2 area and 0.019cm^3 volume. There is Fat Layer (Subcutaneous Tissue) exposed. There is no tunneling or undermining noted. There is a medium amount of serosanguineous drainage noted. The wound margin is distinct with the outline attached to the wound base. There is medium (34-66%) red granulation within the wound bed. There is a medium (34-66%) amount of necrotic tissue within the wound bed including Adherent Slough. The periwound skin appearance had no abnormalities noted for texture. The periwound skin appearance had no abnormalities noted for moisture. The periwound skin appearance exhibited: Hemosiderin Staining. Periwound temperature was noted as No Abnormality. The periwound has tenderness on palpation. General Notes: probes to calcium deposit Wound #3 status is Open. Original cause  of wound was Gradually Appeared. The date acquired was: 12/07/2021. The wound has been in treatment 78 weeks. The wound is located on the Right,Medial Lower Leg. The wound  measures 0.9cm length x 0.7cm width x 0.1cm depth; 0.495cm^2 area and 0.049cm^3 volume. There is Fat Layer (Subcutaneous Tissue) exposed. There is no tunneling or undermining noted. There is a medium amount of serosanguineous drainage noted. The wound margin is distinct with the outline attached to the wound base. There is medium (34-66%) red granulation within the wound bed. There is a medium (34-66%) amount of necrotic tissue within the wound bed including Adherent Slough. The periwound skin appearance exhibited: Dry/Scaly, Hemosiderin Staining. INFANT, SCHULZE ([id number]) [id number] Page 14 of 16 The periwound skin appearance did not exhibit: Callus, Crepitus, Excoriation, Induration, Rash, Scarring, Maceration, Atrophie Blanche, Cyanosis, Ecchymosis, Mottled, Pallor, Rubor, Erythema. Periwound temperature was noted as No Abnormality. Wound #6 status is Open. Original cause of wound was Gradually Appeared. The date acquired was: 05/12/2022. The wound has been in treatment 65 weeks. The wound is located on the Right,Anterior Lower Leg. The wound measures 0.1cm length x 0.1cm width x 0.1cm depth; 0.008cm^2 area and 0.001cm^3 volume. There is Fat Layer (Subcutaneous Tissue) exposed. There is no tunneling or undermining noted. There is a small amount of serous drainage noted. The wound margin is distinct with the outline attached to the wound base. There is no granulation within the wound bed. There is no necrotic tissue within the wound bed. The periwound skin appearance exhibited: Hemosiderin Staining. The periwound skin appearance did not exhibit: Callus, Crepitus, Excoriation, Induration, Rash, Scarring, Dry/Scaly, Maceration, Atrophie Blanche, Cyanosis, Ecchymosis, Mottled, Pallor, Rubor, Erythema. Periwound temperature was noted as No Abnormality. 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 There was a Excisional Skin/Subcutaneous Tissue Debridement with a total area of 0.19 sq cm performed by Duanne Guess, MD. With the following instrument(s): Curette to remove Viable and Non-Viable tissue/material. Material removed includes Subcutaneous Tissue and Slough and after achieving pain control using Lidocaine 4% T opical Solution. No specimens were taken. A time out was conducted at 11:05, prior to the start of the procedure. A Minimum amount of bleeding was controlled with Pressure. The procedure was tolerated well with a pain level of 0 throughout and a pain level of 0 following the procedure. Post Debridement Measurements: 0.4cm length x 0.6cm width x 0.1cm depth; 0.019cm^3 volume. Character of Wound/Ulcer Post Debridement is improved. Post procedure Diagnosis Wound #13: Same as Pre-Procedure Wound #3 Pre-procedure diagnosis of Wound #3 is a Lymphedema located on the Right,Medial Lower Leg . There was a Excisional Skin/Subcutaneous Tissue Debridement with a total area of 0.49 sq cm performed by Duanne Guess, MD. With the following instrument(s): Curette to remove Viable and Non-Viable tissue/material. Material removed includes Subcutaneous Tissue and Slough and after achieving pain control using Lidocaine 4% T opical Solution. No specimens were taken. A time out was conducted at 11:05, prior to the start of the procedure. A Minimum amount of bleeding was controlled with Pressure. The procedure was tolerated well with a pain level of 0 throughout and a pain level of 0 following the  procedure. Post  Debridement Measurements: 0.9cm length x 0.7cm width x 0.1cm depth; 0.049cm^3 volume. Character of Wound/Ulcer Post Debridement is improved. Post procedure Diagnosis Wound #3: Same as Pre-Procedure Pre-procedure diagnosis of Wound #3 is a Lymphedema located on the Right,Medial Lower Leg . There was a Three Layer Compression Therapy Procedure by Thayer Dallas. Post procedure Diagnosis Wound #3: Same as Pre-Procedure Plan Follow-up Appointments: Return Appointment in 1 week. - Dr. [name] Anesthetic: (In clinic) Topical Lidocaine 4% applied to wound bed Bathing/ Shower/ Hygiene: May shower with protection but do not get wound dressing(s) wet. Protect dressing(s) with water repellant cover (for example, large plastic bag) or a cast cover and may then take shower. Lymphedema Treatment Plan - Exercise, Compression and Elevation: Exercise daily as tolerated. (Walking, ROM, Calf Pumps and T T oe aps) Compression Garments, Class II. Wear daily when out of bed. May remove at bedtime Compression Wraps as ordered Elevate legs 30 - 60 minutes at or above heart level at least 3 - 4 times daily as able/tolerated Avoid standing for long periods and elevate leg(s) parallel to the floor when sitting Use Pneumatic Compression Device on leg(s) 2-3 times a day for 45-60 minutes WOUND #13: - Lower Leg Wound Laterality: Right, Medial, Posterior Cleanser: Soap and Water 1 x Per Week/30 Days Discharge Instructions: May shower and wash wound with dial antibacterial soap and water prior to dressing change. Cleanser: Wound Cleanser 1 x Per Week/30 Days Discharge Instructions: Cleanse the wound with wound cleanser prior to applying a clean dressing using gauze sponges, not tissue or cotton balls. Peri-Wound Care: Skin Prep 1 x Per Week/30 Days Discharge Instructions: cavelon to macerated wound margins CLEASON, FEICK ([id number]) [id number] Page 15 of 16 Peri-Wound Care:  Sween Lotion (Moisturizing lotion) 1 x Per Week/30 Days Discharge Instructions: Apply moisturizing lotion as directed Prim Dressing: Maxorb Extra Ag+ Alginate Dressing, 4x4.75 (in/in) 1 x Per Week/30 Days ary Discharge Instructions: Apply to excoriated weeping areas Secondary Dressing: ABD Pad, 8x10 1 x Per Week/30 Days Discharge Instructions: Apply over primary dressing as directed. Com pression Wrap: ThreePress (3 layer compression wrap) 1 x Per Week/30 Days Discharge Instructions: Apply three layer compression as directed. Com pression Wrap: Unnaboot w/Calamine, 4x10 (in/yd) 1 x Per Week/30 Days Discharge Instructions: Apply Unnaboot on distal foot and top of lower leg WOUND #3: - Lower Leg Wound Laterality: Right, Medial Cleanser: Soap and Water 1 x Per Week/30 Days Discharge Instructions: May shower and wash wound with dial antibacterial soap and water prior to dressing change. Cleanser: Wound Cleanser 1 x Per Week/30 Days Discharge Instructions: Cleanse the wound with wound cleanser prior to applying a clean dressing using gauze sponges, not tissue or cotton balls. Peri-Wound Care: Skin Prep 1 x Per Week/30 Days Discharge Instructions: cavelon to macerated wound margins Peri-Wound Care: Sween Lotion (Moisturizing lotion) 1 x Per Week/30 Days Discharge Instructions: Apply moisturizing lotion as directed Prim Dressing: Maxorb Extra Ag+ Alginate Dressing, 4x4.75 (in/in) 1 x Per Week/30 Days ary Discharge Instructions: Apply to excoriated weeping areas Secondary Dressing: ABD Pad, 8x10 1 x Per Week/30 Days Discharge Instructions: Apply over primary dressing as directed. Com pression Wrap: ThreePress (3 layer compression wrap) 1 x Per Week/30 Days Discharge Instructions: Apply three layer compression as directed. Com pression Wrap: Unnaboot w/Calamine, 4x10 (in/yd) 1 x Per Week/30 Days Discharge Instructions: Apply Unnaboot on distal foot and top of lower leg WOUND #6: - Lower Leg Wound  Laterality: Right, Anterior Cleanser: Soap and Water 1 x Per Week/30 Days Discharge  Instructions: May shower and wash wound with dial antibacterial soap and water prior to dressing change. Cleanser: Wound Cleanser 1 x Per Week/30 Days Discharge Instructions: Cleanse the wound with wound cleanser prior to applying a clean dressing using gauze sponges, not tissue or cotton balls. Peri-Wound Care: Sween Lotion (Moisturizing lotion) 1 x Per Week/30 Days Discharge Instructions: Apply moisturizing lotion as directed Prim Dressing: Maxorb Extra Ag+ Alginate Dressing, 2x2 (in/in) 1 x Per Week/30 Days ary Discharge Instructions: Apply to wound bed as instructed Secondary Dressing: ABD Pad, 8x10 1 x Per Week/30 Days Discharge Instructions: Apply over primary dressing as directed. Com pression Wrap: ThreePress (3 layer compression wrap) 1 x Per Week/30 Days Discharge Instructions: Apply three layer compression as directed. 08/12/2023: Both medial leg ulcers are quite a bit smaller this week. They are shallower and I do not appreciate any calcinosis. There is some slough on the surface. The anterior tibial wound is just a pinhole. The periwound skin is in excellent condition. I used a curette to debride slough and subcutaneous tissue from the medial leg ulcers. There was really nothing to debride on the anterior tibial wound. We will continue silver alginate with 3 layer compression. 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 Follow-up in 1 week. Electronic Signature(s) Signed: 08/12/2023 1:37:16 PM By: Zenaida Deed RN, BSN Signed: 08/15/2023 7:51:51 AM By: Duanne Guess MD FACS Previous Signature: 08/12/2023 11:13:39 AM Version By: Duanne Guess MD FACS Entered By: Zenaida Deed on 08/12/2023 12:34:24 -------------------------------------------------------------------------------- SuperBill Details Patient  Name: Date of Service: TERESO, PINK 08/12/2023 Medical Record Number: [id number] Patient Account Number: [id number] Date of Birth/Sex: Treating RN: 08/29/1950 (73 y.o. M) Primary Care Provider: [name] Other Clinician: Referring Provider: Treating Provider/Extender: [name] in Treatment: 78 Diagnosis Coding ICD-10 Codes Code Description [phone] 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.0 Lymphedema, not elsewhere classified DAKKOTA, SPICKARD ([id number]) [phone].pdf Page 16 of 16 I87.2 Venous insufficiency (chronic) (peripheral) Z86.718 Personal history of other venous thrombosis and embolism I10 Essential (primary) hypertension I25.10 Atherosclerotic heart disease of native coronary artery without angina pectoris Facility Procedures : CPT4 Code: [id number] Description: 11042 - DEB SUBQ TISSUE 20 SQ CM/< ICD-10 Diagnosis Description L97.812 Non-pressure chronic ulcer of other part of right lower leg with fat layer exp Modifier: osed Quantity: 1 Physician Procedures : CPT4 Code Description Modifier [id number] - WC PHYS LEVEL 4 - EST PT ICD-10 Diagnosis Description L97.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.0 Lymphedema, not elsewhere  classified I87.2 Venous insufficiency (chronic) (peripheral) Quantity: 1 : [id number] 11042 - WC PHYS SUBQ TISS 20 SQ CM ICD-10 Diagnosis Description L97.812 Non-pressure chronic ulcer of other part of right lower leg with fat layer exposed Quantity: 1 Electronic Signature(s) Signed: 08/12/2023 11:13:53 AM By: Duanne Guess MD FACS Entered By: Duanne Guess on 08/12/2023 11:13:53

## 2023-08-19 ENCOUNTER — Encounter (HOSPITAL_BASED_OUTPATIENT_CLINIC_OR_DEPARTMENT_OTHER): Payer: Medicare Other | Admitting: General Surgery

## 2023-08-19 DIAGNOSIS — L97812 Non-pressure chronic ulcer of other part of right lower leg with fat layer exposed: Secondary | ICD-10-CM | POA: Diagnosis not present

## 2023-08-19 DIAGNOSIS — I872 Venous insufficiency (chronic) (peripheral): Secondary | ICD-10-CM | POA: Diagnosis not present

## 2023-08-19 DIAGNOSIS — Z86718 Personal history of other venous thrombosis and embolism: Secondary | ICD-10-CM | POA: Diagnosis not present

## 2023-08-19 DIAGNOSIS — L942 Calcinosis cutis: Secondary | ICD-10-CM | POA: Diagnosis not present

## 2023-08-19 DIAGNOSIS — I890 Lymphedema, not elsewhere classified: Secondary | ICD-10-CM | POA: Diagnosis not present

## 2023-08-19 DIAGNOSIS — L97212 Non-pressure chronic ulcer of right calf with fat layer exposed: Secondary | ICD-10-CM | POA: Diagnosis not present

## 2023-08-19 DIAGNOSIS — I2510 Atherosclerotic heart disease of native coronary artery without angina pectoris: Secondary | ICD-10-CM | POA: Diagnosis not present

## 2023-08-26 ENCOUNTER — Encounter (HOSPITAL_BASED_OUTPATIENT_CLINIC_OR_DEPARTMENT_OTHER): Payer: Medicare Other | Admitting: General Surgery

## 2023-08-26 DIAGNOSIS — L942 Calcinosis cutis: Secondary | ICD-10-CM | POA: Diagnosis not present

## 2023-08-26 DIAGNOSIS — I872 Venous insufficiency (chronic) (peripheral): Secondary | ICD-10-CM | POA: Diagnosis not present

## 2023-08-26 DIAGNOSIS — Z86718 Personal history of other venous thrombosis and embolism: Secondary | ICD-10-CM | POA: Diagnosis not present

## 2023-08-26 DIAGNOSIS — I890 Lymphedema, not elsewhere classified: Secondary | ICD-10-CM | POA: Diagnosis not present

## 2023-08-26 DIAGNOSIS — I2510 Atherosclerotic heart disease of native coronary artery without angina pectoris: Secondary | ICD-10-CM | POA: Diagnosis not present

## 2023-08-26 DIAGNOSIS — L97812 Non-pressure chronic ulcer of other part of right lower leg with fat layer exposed: Secondary | ICD-10-CM | POA: Diagnosis not present

## 2023-09-01 NOTE — Telephone Encounter (Signed)
 I would recomm follow up in Dec 2025

## 2023-09-02 ENCOUNTER — Encounter (HOSPITAL_BASED_OUTPATIENT_CLINIC_OR_DEPARTMENT_OTHER): Payer: Medicare Other | Attending: General Surgery | Admitting: General Surgery

## 2023-09-02 DIAGNOSIS — L942 Calcinosis cutis: Secondary | ICD-10-CM | POA: Insufficient documentation

## 2023-09-02 DIAGNOSIS — I10 Essential (primary) hypertension: Secondary | ICD-10-CM | POA: Diagnosis not present

## 2023-09-02 DIAGNOSIS — I2510 Atherosclerotic heart disease of native coronary artery without angina pectoris: Secondary | ICD-10-CM | POA: Insufficient documentation

## 2023-09-02 DIAGNOSIS — I872 Venous insufficiency (chronic) (peripheral): Secondary | ICD-10-CM | POA: Diagnosis not present

## 2023-09-02 DIAGNOSIS — Z86718 Personal history of other venous thrombosis and embolism: Secondary | ICD-10-CM | POA: Diagnosis not present

## 2023-09-02 DIAGNOSIS — L97212 Non-pressure chronic ulcer of right calf with fat layer exposed: Secondary | ICD-10-CM | POA: Diagnosis not present

## 2023-09-02 DIAGNOSIS — L97812 Non-pressure chronic ulcer of other part of right lower leg with fat layer exposed: Secondary | ICD-10-CM | POA: Insufficient documentation

## 2023-09-02 DIAGNOSIS — I890 Lymphedema, not elsewhere classified: Secondary | ICD-10-CM | POA: Diagnosis not present

## 2023-09-02 NOTE — Telephone Encounter (Signed)
 Pt advised and my chart sent as a reminder/ recall placed.

## 2023-09-05 ENCOUNTER — Other Ambulatory Visit: Payer: Self-pay | Admitting: Physician Assistant

## 2023-09-05 DIAGNOSIS — D539 Nutritional anemia, unspecified: Secondary | ICD-10-CM

## 2023-09-05 DIAGNOSIS — D696 Thrombocytopenia, unspecified: Secondary | ICD-10-CM

## 2023-09-05 DIAGNOSIS — Z86718 Personal history of other venous thrombosis and embolism: Secondary | ICD-10-CM

## 2023-09-07 ENCOUNTER — Inpatient Hospital Stay: Payer: Medicare Other

## 2023-09-07 ENCOUNTER — Telehealth: Payer: Self-pay | Admitting: Hematology and Oncology

## 2023-09-07 ENCOUNTER — Inpatient Hospital Stay: Payer: Medicare Other | Admitting: Physician Assistant

## 2023-09-09 ENCOUNTER — Encounter (HOSPITAL_BASED_OUTPATIENT_CLINIC_OR_DEPARTMENT_OTHER): Payer: Medicare Other | Admitting: General Surgery

## 2023-09-09 DIAGNOSIS — I10 Essential (primary) hypertension: Secondary | ICD-10-CM | POA: Diagnosis not present

## 2023-09-09 DIAGNOSIS — L942 Calcinosis cutis: Secondary | ICD-10-CM | POA: Diagnosis not present

## 2023-09-09 DIAGNOSIS — I2510 Atherosclerotic heart disease of native coronary artery without angina pectoris: Secondary | ICD-10-CM | POA: Diagnosis not present

## 2023-09-09 DIAGNOSIS — I890 Lymphedema, not elsewhere classified: Secondary | ICD-10-CM | POA: Diagnosis not present

## 2023-09-09 DIAGNOSIS — L97812 Non-pressure chronic ulcer of other part of right lower leg with fat layer exposed: Secondary | ICD-10-CM | POA: Diagnosis not present

## 2023-09-09 DIAGNOSIS — I872 Venous insufficiency (chronic) (peripheral): Secondary | ICD-10-CM | POA: Diagnosis not present

## 2023-09-11 ENCOUNTER — Other Ambulatory Visit: Payer: Self-pay | Admitting: Internal Medicine

## 2023-09-16 ENCOUNTER — Encounter (HOSPITAL_BASED_OUTPATIENT_CLINIC_OR_DEPARTMENT_OTHER): Payer: Medicare Other | Admitting: General Surgery

## 2023-09-16 DIAGNOSIS — I872 Venous insufficiency (chronic) (peripheral): Secondary | ICD-10-CM | POA: Diagnosis not present

## 2023-09-16 DIAGNOSIS — I890 Lymphedema, not elsewhere classified: Secondary | ICD-10-CM | POA: Diagnosis not present

## 2023-09-16 DIAGNOSIS — L97812 Non-pressure chronic ulcer of other part of right lower leg with fat layer exposed: Secondary | ICD-10-CM | POA: Diagnosis not present

## 2023-09-16 DIAGNOSIS — I2510 Atherosclerotic heart disease of native coronary artery without angina pectoris: Secondary | ICD-10-CM | POA: Diagnosis not present

## 2023-09-16 DIAGNOSIS — I10 Essential (primary) hypertension: Secondary | ICD-10-CM | POA: Diagnosis not present

## 2023-09-16 DIAGNOSIS — L942 Calcinosis cutis: Secondary | ICD-10-CM | POA: Diagnosis not present

## 2023-09-23 ENCOUNTER — Encounter (HOSPITAL_BASED_OUTPATIENT_CLINIC_OR_DEPARTMENT_OTHER): Payer: Medicare Other | Admitting: General Surgery

## 2023-09-23 DIAGNOSIS — I890 Lymphedema, not elsewhere classified: Secondary | ICD-10-CM | POA: Diagnosis not present

## 2023-09-23 DIAGNOSIS — I10 Essential (primary) hypertension: Secondary | ICD-10-CM | POA: Diagnosis not present

## 2023-09-23 DIAGNOSIS — L942 Calcinosis cutis: Secondary | ICD-10-CM | POA: Diagnosis not present

## 2023-09-23 DIAGNOSIS — L97812 Non-pressure chronic ulcer of other part of right lower leg with fat layer exposed: Secondary | ICD-10-CM | POA: Diagnosis not present

## 2023-09-23 DIAGNOSIS — I2510 Atherosclerotic heart disease of native coronary artery without angina pectoris: Secondary | ICD-10-CM | POA: Diagnosis not present

## 2023-09-23 DIAGNOSIS — I872 Venous insufficiency (chronic) (peripheral): Secondary | ICD-10-CM | POA: Diagnosis not present

## 2023-09-30 ENCOUNTER — Encounter (HOSPITAL_BASED_OUTPATIENT_CLINIC_OR_DEPARTMENT_OTHER): Payer: Medicare Other | Attending: General Surgery | Admitting: General Surgery

## 2023-09-30 DIAGNOSIS — L97812 Non-pressure chronic ulcer of other part of right lower leg with fat layer exposed: Secondary | ICD-10-CM | POA: Diagnosis not present

## 2023-09-30 DIAGNOSIS — L942 Calcinosis cutis: Secondary | ICD-10-CM | POA: Diagnosis not present

## 2023-09-30 DIAGNOSIS — I872 Venous insufficiency (chronic) (peripheral): Secondary | ICD-10-CM | POA: Insufficient documentation

## 2023-09-30 DIAGNOSIS — I890 Lymphedema, not elsewhere classified: Secondary | ICD-10-CM | POA: Diagnosis not present

## 2023-09-30 DIAGNOSIS — Z86718 Personal history of other venous thrombosis and embolism: Secondary | ICD-10-CM | POA: Diagnosis not present

## 2023-09-30 DIAGNOSIS — I2510 Atherosclerotic heart disease of native coronary artery without angina pectoris: Secondary | ICD-10-CM | POA: Diagnosis not present

## 2023-09-30 DIAGNOSIS — I10 Essential (primary) hypertension: Secondary | ICD-10-CM | POA: Insufficient documentation

## 2023-10-07 ENCOUNTER — Encounter (HOSPITAL_BASED_OUTPATIENT_CLINIC_OR_DEPARTMENT_OTHER): Payer: Medicare Other | Admitting: Internal Medicine

## 2023-10-07 DIAGNOSIS — L97812 Non-pressure chronic ulcer of other part of right lower leg with fat layer exposed: Secondary | ICD-10-CM | POA: Diagnosis not present

## 2023-10-07 DIAGNOSIS — I2510 Atherosclerotic heart disease of native coronary artery without angina pectoris: Secondary | ICD-10-CM | POA: Diagnosis not present

## 2023-10-07 DIAGNOSIS — I10 Essential (primary) hypertension: Secondary | ICD-10-CM | POA: Diagnosis not present

## 2023-10-07 DIAGNOSIS — I890 Lymphedema, not elsewhere classified: Secondary | ICD-10-CM | POA: Diagnosis not present

## 2023-10-07 DIAGNOSIS — I872 Venous insufficiency (chronic) (peripheral): Secondary | ICD-10-CM | POA: Diagnosis not present

## 2023-10-07 DIAGNOSIS — L942 Calcinosis cutis: Secondary | ICD-10-CM | POA: Diagnosis not present

## 2023-10-14 ENCOUNTER — Encounter (HOSPITAL_BASED_OUTPATIENT_CLINIC_OR_DEPARTMENT_OTHER): Payer: Medicare Other | Admitting: General Surgery

## 2023-10-14 DIAGNOSIS — L942 Calcinosis cutis: Secondary | ICD-10-CM | POA: Diagnosis not present

## 2023-10-14 DIAGNOSIS — L97812 Non-pressure chronic ulcer of other part of right lower leg with fat layer exposed: Secondary | ICD-10-CM | POA: Diagnosis not present

## 2023-10-14 DIAGNOSIS — I890 Lymphedema, not elsewhere classified: Secondary | ICD-10-CM | POA: Diagnosis not present

## 2023-10-14 DIAGNOSIS — I10 Essential (primary) hypertension: Secondary | ICD-10-CM | POA: Diagnosis not present

## 2023-10-14 DIAGNOSIS — I2510 Atherosclerotic heart disease of native coronary artery without angina pectoris: Secondary | ICD-10-CM | POA: Diagnosis not present

## 2023-10-14 DIAGNOSIS — I872 Venous insufficiency (chronic) (peripheral): Secondary | ICD-10-CM | POA: Diagnosis not present

## 2023-10-17 ENCOUNTER — Other Ambulatory Visit: Payer: Self-pay | Admitting: Physician Assistant

## 2023-10-17 DIAGNOSIS — Z86718 Personal history of other venous thrombosis and embolism: Secondary | ICD-10-CM

## 2023-10-17 DIAGNOSIS — D539 Nutritional anemia, unspecified: Secondary | ICD-10-CM

## 2023-10-17 DIAGNOSIS — D696 Thrombocytopenia, unspecified: Secondary | ICD-10-CM

## 2023-10-19 ENCOUNTER — Inpatient Hospital Stay: Payer: Medicare Other | Attending: Internal Medicine

## 2023-10-19 ENCOUNTER — Inpatient Hospital Stay

## 2023-10-19 ENCOUNTER — Inpatient Hospital Stay: Payer: Medicare Other | Admitting: Physician Assistant

## 2023-10-19 VITALS — BP 144/65 | HR 66 | Temp 97.9°F | Resp 18 | Ht 69.5 in | Wt 295.1 lb

## 2023-10-19 DIAGNOSIS — Z86718 Personal history of other venous thrombosis and embolism: Secondary | ICD-10-CM

## 2023-10-19 DIAGNOSIS — D539 Nutritional anemia, unspecified: Secondary | ICD-10-CM

## 2023-10-19 DIAGNOSIS — D696 Thrombocytopenia, unspecified: Secondary | ICD-10-CM | POA: Insufficient documentation

## 2023-10-19 DIAGNOSIS — Z7901 Long term (current) use of anticoagulants: Secondary | ICD-10-CM | POA: Insufficient documentation

## 2023-10-19 LAB — CMP (CANCER CENTER ONLY)
ALT: 13 U/L (ref 0–44)
AST: 21 U/L (ref 15–41)
Albumin: 3.9 g/dL (ref 3.5–5.0)
Alkaline Phosphatase: 38 U/L (ref 38–126)
Anion gap: 8 (ref 5–15)
BUN: 23 mg/dL (ref 8–23)
CO2: 30 mmol/L (ref 22–32)
Calcium: 9.1 mg/dL (ref 8.9–10.3)
Chloride: 103 mmol/L (ref 98–111)
Creatinine: 0.95 mg/dL (ref 0.61–1.24)
GFR, Estimated: >60 mL/min (ref >60)
Glucose, Bld: 88 mg/dL (ref 70–99)
Potassium: 4.5 mmol/L (ref 3.5–5.1)
Sodium: 141 mmol/L (ref 135–145)
Total Bilirubin: 0.7 mg/dL (ref 0.0–1.2)
Total Protein: 6.3 g/dL — ABNORMAL LOW (ref 6.5–8.1)

## 2023-10-19 LAB — CBC WITH DIFFERENTIAL (CANCER CENTER ONLY)
Abs Immature Granulocytes: 0.01 10*3/uL (ref 0.00–0.07)
Basophils Absolute: 0 10*3/uL (ref 0.0–0.1)
Basophils Relative: 0 %
Eosinophils Absolute: 0.2 10*3/uL (ref 0.0–0.5)
Eosinophils Relative: 6 %
HCT: 38.1 % — ABNORMAL LOW (ref 39.0–52.0)
Hemoglobin: 12.5 g/dL — ABNORMAL LOW (ref 13.0–17.0)
Immature Granulocytes: 0 %
Lymphocytes Relative: 21 %
Lymphs Abs: 0.8 10*3/uL (ref 0.7–4.0)
MCH: 34.3 pg — ABNORMAL HIGH (ref 26.0–34.0)
MCHC: 32.8 g/dL (ref 30.0–36.0)
MCV: 104.7 fL — ABNORMAL HIGH (ref 80.0–100.0)
Monocytes Absolute: 0.4 10*3/uL (ref 0.1–1.0)
Monocytes Relative: 11 %
Neutro Abs: 2.3 10*3/uL (ref 1.7–7.7)
Neutrophils Relative %: 62 %
Platelet Count: 54 10*3/uL — ABNORMAL LOW (ref 150–400)
RBC: 3.64 MIL/uL — ABNORMAL LOW (ref 4.22–5.81)
RDW: 13.2 % (ref 11.5–15.5)
WBC Count: 3.7 10*3/uL — ABNORMAL LOW (ref 4.0–10.5)
nRBC: 0 % (ref 0.0–0.2)

## 2023-10-19 LAB — VITAMIN B12: Vitamin B-12: 374 pg/mL (ref 180–914)

## 2023-10-19 LAB — FOLATE: Folate: >40 ng/mL (ref >5.9)

## 2023-10-19 NOTE — Progress Notes (Signed)
 [facility] [phone]   [phone]  PROGRESS NOTE  Patient Care Team: Jeoffrey Massed, MD as PCP - General (Family Medicine) Pricilla Riffle, MD as PCP - Cardiology (Cardiology) Pricilla Riffle, MD as Consulting Physician (Cardiology) [name] Lot, MD as Consulting Physician (Dermatology)  Hematological/Oncological History 1) 01/02/2021: Presented to ED in [place] due to swelling and redness of the legs x 8 days.  Doppler US revealed nonocclusive deep venous thrombus involving the left common femoral vein, proximal femoral vein and popliteal vein extending to the calf trifurcation. Stared Eliquis therapy  2) 01/29/2022: Hypercoagulable panel ordered and did not show underlying clotting disorder.   3) 03/03/2022: Establish care with [facility] Hematology with Dr. Jeanie Sewer and Georga Kaufmann PA-C  CHIEF COMPLAINTS/PURPOSE OF CONSULTATION:  Hx of DVT currently on Eliquis 2. Thrombocytopenia 3. Macrocytic anemia  HISTORY OF PRESENTING ILLNESS:  [name] 73 y.o. adult returns for a follow up for history of DVT and macrocytic anemia/thrombocytopenia. He was last seen by Dr. Leonides Schanz on 03/03/2024. He is unaccompanied for this visit.   On exam today, Mr. Pelfrey reports he has noticed more fatigue and is quite sedentary mainly due to his muscle disease. He is gaining weight but admits that he doesn't eat much. He denies nausea, vomiting or bowel habit changes. He does bruise easily but denies any signs of active bleeding. He reports having intermittent episodes of  heart palpitations that occur every 2-3 times a week lasting several hours. He does have persistent shortness of breath which he contributes to his weight gain and inactivity.  He notes that he has not had any signs or symptoms concerning for a recurrent blood clot.  He has he continues taking his Eliquis 2.5 mg twice daily with no difficulty.  He denies fevers, chills, night sweats, chest pain or cough. He has  no other complaints. Rest of the 10 point ROS is below.    MEDICAL HISTORY:  Past Medical History:  Diagnosis Date   Adenomatous colon polyp - diminutive 06/2019   no recall needed   Bilateral lower extremity edema    Coronary artery disease    a. s/p BMS to LAD (1998) b. s/p DES to RCA (06/24/14)   DVT (deep venous thrombosis) (HCC)    12/2020 L femoral and poplitea.  Unprovoked.  hypercoag w/u NEG.   Eliquis.   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 ?inclusion body myositis? Power WC as of 2022   Macrocytic anemia    Estab with cone hem/onc 08/2022   Osteoarthritis, multiple sites    Palatal fistula    Palpable abd. aorta    Aortic u/s NO ANEURISM 2020   RBBB    Spinal stenosis    Dr Louanna Raw  surgery   Thrombocytopenia [facility])     SURGICAL HISTORY: Past Surgical History:  Procedure Laterality Date   APPENDECTOMY  2010   CERVICAL FUSION  09/2009   CHOLECYSTECTOMY  1998   COLONOSCOPY     2020-->no recall   EYE SURGERY     Lasik   HYDROCELE EXCISION  2005   LEFT HEART CATHETERIZATION WITH CORONARY ANGIOGRAM  N/A 06/24/2014   Procedure: LEFT HEART CATHETERIZATION WITH CORONARY ANGIOGRAM;  Surgeon: Lennette Bihari, MD;  Location: [facility] CATH LAB;  Service: Cardiovascular;  Laterality: N/A;   LUMBAR SPINE SURGERY  11/2011   MUSCLE BIOPSY Left 07/11/2018   Procedure: Left Vastus lateralus muscle biopsy;  Surgeon: Jadene Pierini, MD;  Location: [facility] OR;  Service: Neurosurgery;  Laterality: Left;  Left Vastus lateralus muscle biopsy   PERCUTANEOUS CORONARY STENT INTERVENTION (PCI-S)  06/24/2014   DES to RCA. Procedure: PERCUTANEOUS CORONARY STENT INTERVENTION (PCI-S);  Surgeon: Lennette Bihari, MD;  Location: [facility] CATH LAB;   Service: Cardiovascular;;  Distal RCA   Rhythm monitoring     04/2023, occ pvc's   TONSILLECTOMY AND ADENOIDECTOMY  2012   TOTAL HIP ARTHROPLASTY Right 2021   RIGHT   TRANSTHORACIC ECHOCARDIOGRAM     03/2019 normal.  Nov 2024 mild RV dysf, o/w normal    SOCIAL HISTORY: Social History   Socioeconomic History   Marital status: Married    Spouse name: Not on file   Number of children: Not on file   Years of education: Not on file   Highest education level: 3rd grade  Occupational History   Not on file  Tobacco Use   Smoking status: Former    Current packs/day: 0.00    Average packs/day: 0.8 packs/day for 23.0 years (17.3 ttl pk-yrs)    Types: Cigarettes    Start date: 07/26/1966    Quit date: 07/26/1989    Years since quitting: 34.2   Smokeless tobacco: Never  Vaping Use   Vaping status: Never Used  Substance and Sexual Activity   Alcohol use: Yes    Comment: 3-6 cans per week   Drug use: No   Sexual activity: Yes    Partners: Female    Birth control/protection: None    Comment: married  Other Topics Concern   Not on file  Social History Narrative   Married, 2 kids (one is Teacher, early years/pre at [employer]).   Educ: HS   Retired Dance movement psychotherapist   Patient is a former smoker, quit age 21   Alcohol use - yes, hx beer drinker, has approx 6 pack per week   Drug Use- no    caffeine 3 cups coffee daily   Enjoys motorcycles   Social Drivers of Corporate investment banker Strain: Low Risk  (06/10/2023)   Overall Financial Resource Strain (CARDIA)    Difficulty of Paying Living Expenses: Not hard at all  Food Insecurity: No Food Insecurity (06/10/2023)   Hunger Vital Sign    Worried About Running Out of Food in the Last Year: Never true    Ran Out of Food in the Last Year: Never true  Transportation Needs: No Transportation Needs (12/22/2022)   PRAPARE - Administrator, Civil Service (Medical): No    Lack of Transportation (Non-Medical): No  Physical Activity:  Inactive (06/10/2023)   Exercise Vital Sign    Days of Exercise per Week: 0 days    Minutes of Exercise per Session: 0 min  Stress: No Stress Concern Present (06/10/2023)   [employer] of Occupational Health - Occupational Stress Questionnaire    Feeling of Stress : Not at all  Social Connections: Socially Isolated (06/10/2023)   Social Connection and Isolation Panel [NHANES]    Frequency of Communication with Friends and Family: Once a week    Frequency of Social Gatherings with Friends and Family: Once a week    Attends Religious  Services: Never    Active Member of Clubs or Organizations: No    Attends Banker Meetings: Never    Marital Status: Married  Catering manager Violence: Not At Risk (12/22/2022)   Humiliation, Afraid, Rape, and Kick questionnaire    Fear of Current or Ex-Partner: No    Emotionally Abused: No    Physically Abused: No    Sexually Abused: No    FAMILY HISTORY: Family History  Problem Relation Age of Onset   Heart attack Mother    Esophageal cancer Mother    Aneurysm Father    Heart disease Father    Cystic fibrosis Brother        no prior health issues, rare form of cystic fibrosis of the lungs (aug 2016)   Stroke Neg Hx     ALLERGIES:  has no known allergies.  MEDICATIONS:  Current Outpatient Medications  Medication Sig Dispense Refill   acetaminophen (TYLENOL) 500 MG tablet Take by mouth as needed for moderate pain or mild pain.     apixaban (ELIQUIS) 2.5 MG TABS tablet Take 1 tablet (2.5 mg total) by mouth 2 (two) times daily. 180 tablet 3   atenolol (TENORMIN) 25 MG tablet Take 1 tablet (25 mg total) by mouth 2 (two) times daily. 180 tablet 3   b complex vitamins tablet Take 1 tablet by mouth daily.     cholecalciferol (VITAMIN D) 1000 units tablet Take 1,000 Units by mouth daily.     Coenzyme Q10 (CO Q 10) 100 MG CAPS Take 100 mg by mouth daily.      ezetimibe (ZETIA) 10 MG tablet Take 1 tablet (10 mg total) by mouth daily.  90 tablet 3   folic acid (FOLVITE) 800 MCG tablet Take 800 mcg by mouth daily.      furosemide (LASIX) 40 MG tablet Take 1 tablet (40 mg total) by mouth every other day.     glucosamine-chondroitin 500-400 MG tablet Take 2 tablets by mouth daily.      ibuprofen (ADVIL,MOTRIN) 800 MG tablet Take 800 mg by mouth daily.      Krill Oil 500 MG CAPS Take 500 mg by mouth daily.      levofloxacin (LEVAQUIN) 750 MG tablet Take 750 mg by mouth daily.     lisinopril (ZESTRIL) 10 MG tablet Take 1 tablet (10 mg total) by mouth daily. 90 tablet 3   Melatonin 10 MG TABS Take 20 mg by mouth at bedtime.     potassium chloride (KLOR-CON) 10 MEQ tablet Take 1 tablet (10 mEq total) by mouth every other day.     rosuvastatin (CRESTOR) 5 MG tablet TAKE 1 TABLET (5 MG TOTAL) BY MOUTH DAILY. 90 tablet 1   Saw Palmetto, Serenoa repens, (SAW PALMETTO PO) Take 300 mg by mouth daily.     tamsulosin (FLOMAX) 0.4 MG CAPS capsule Take 1 capsule (0.4 mg total) by mouth daily. 90 capsule 1   triamcinolone cream (KENALOG) 0.1 % Apply 1 application topically as directed. 30 g 3   No current facility-administered medications for this visit.    REVIEW OF SYSTEMS:   Constitutional: ( - ) fevers, ( - )  chills , ( - ) night sweats Eyes: ( - ) blurriness of vision, ( - ) double vision, ( - ) watery eyes Ears, nose, mouth, throat, and face: ( - ) mucositis, ( - ) sore throat Respiratory: ( - ) cough, ( - ) dyspnea, ( - ) wheezes Cardiovascular: ( - )  palpitation, ( - ) chest discomfort, ( + ) lower extremity swelling Gastrointestinal:  ( - ) nausea, ( - ) heartburn, ( - ) change in bowel habits Skin: ( - ) abnormal skin rashes Lymphatics: ( - ) new lymphadenopathy, ( - ) easy bruising Neurological: ( - ) numbness, ( - ) tingling, ( - ) new weaknesses Behavioral/Psych: ( - ) mood change, ( - ) new changes  All other systems were reviewed with the patient and are negative.  PHYSICAL EXAMINATION: ECOG PERFORMANCE STATUS: 1 -  Symptomatic but completely ambulatory  Vitals:   10/19/23 1300  BP: (!) 144/65  Pulse: 66  Resp: 18  Temp: 97.9 F (36.6 C)  SpO2: 98%    Filed Weights   10/19/23 1300  Weight: 295 lb 1.6 oz (133.9 kg)     GENERAL: well appearing male in NAD  SKIN: skin color, texture, turgor are normal, no rashes or significant lesions EYES: conjunctiva are pink and non-injected, sclera clear LUNGS: clear to auscultation and percussion with normal breathing effort HEART: regular rate & rhythm and no murmurs. Bilateral lower extremity edema, wrapped so unable to assess ulcerations.  ABDOMEN: soft, non-tender, non-distended, normal bowel sounds Musculoskeletal: no cyanosis of digits and no clubbing  PSYCH: alert & oriented x 3, fluent speech NEURO: no focal motor/sensory deficits  LABORATORY DATA:  I have reviewed the data as listed    Latest Ref Rng & Units 10/19/2023   12:19 PM 05/30/2023    2:16 PM 05/05/2023    3:14 PM  CBC  WBC 4.0 - 10.5 K/uL 3.7  6.3  5.0   Hemoglobin 13.0 - 17.0 g/dL 16.1  09.6  04.5   Hematocrit 39.0 - 52.0 % 38.1  39.2  37.7   Platelets 150 - 400 K/uL 54  85  92        Latest Ref Rng & Units 10/19/2023   12:19 PM 06/27/2023    2:44 PM 05/30/2023    2:16 PM  CMP  Glucose 70 - 99 mg/dL 88  88  81   BUN 8 - 23 mg/dL 23  26  17    Creatinine 0.61 - 1.24 mg/dL 4.09  8.11  9.14   Sodium 135 - 145 mmol/L 141  143  144   Potassium 3.5 - 5.1 mmol/L 4.5  5.0  4.5   Chloride 98 - 111 mmol/L 103  104  103   CO2 22 - 32 mmol/L 30  26  25    Calcium 8.9 - 10.3 mg/dL 9.1  9.1  9.2   Total Protein 6.5 - 8.1 g/dL 6.3     Total Bilirubin 0.0 - 1.2 mg/dL 0.7     Alkaline Phos 38 - 126 U/L 38     AST 15 - 41 U/L 21     ALT 0 - 44 U/L 13       RADIOGRAPHIC STUDIES: I have personally reviewed the radiological images as listed and agreed with the findings in the report. No results found.  ASSESSMENT & PLAN Ebin Palazzi is a 73 y.o. adult returns for a follow up for  history of DVT while on Eliquis therapy.   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 Recommend to continue --patient denies any bleeding, bruising, or dark stools on this medication. It is well tolerated. No difficulties accessing/affording the medication --RTC in 6 months' time with strict return precautions for overt signs  of bleeding.   #Macrocytic anemia #Thrombocytopenia: --Workup from 03/12/2022 ruled out nutritional deficiencies, paraproteinemia,  hepatitis B/C, HIV --Abdominal US from 03/25/2022 showed mild fatty liver otherwise no other liver disease or splenomegaly --Labs today show worsening thrombocytopenia with Plt 54 (down from 111K in August 2024), persistent anemia with Hgb 12.5, MCV 104.7.  --Discussed monitoring versus proceeding with bone marrow biopsy since Plt dropped significantly since last visit. Will recheck nutritional levels today before proceeding with BMBx.   Follow up: --RTC after BMBx to review results.   No orders of the defined types were placed in this encounter.   All questions were answered. The patient knows to call the clinic with any problems, questions or concerns.  I have spent a total of 30 minutes minutes of face-to-face and non-face-to-face time, preparing to see the patient, obtaining and/or reviewing separately obtained history, performing a medically appropriate examination, counseling and educating the patient, documenting clinical information in the electronic health record and care coordination.   Georga Kaufmann PA-C Dept of Hematology and Oncology [facility] at [facility] Phone: [phone]

## 2023-10-21 ENCOUNTER — Encounter (HOSPITAL_BASED_OUTPATIENT_CLINIC_OR_DEPARTMENT_OTHER): Payer: Medicare Other | Admitting: General Surgery

## 2023-10-21 DIAGNOSIS — I10 Essential (primary) hypertension: Secondary | ICD-10-CM | POA: Diagnosis not present

## 2023-10-21 DIAGNOSIS — I2510 Atherosclerotic heart disease of native coronary artery without angina pectoris: Secondary | ICD-10-CM | POA: Diagnosis not present

## 2023-10-21 DIAGNOSIS — I890 Lymphedema, not elsewhere classified: Secondary | ICD-10-CM | POA: Diagnosis not present

## 2023-10-21 DIAGNOSIS — I872 Venous insufficiency (chronic) (peripheral): Secondary | ICD-10-CM | POA: Diagnosis not present

## 2023-10-21 DIAGNOSIS — L942 Calcinosis cutis: Secondary | ICD-10-CM | POA: Diagnosis not present

## 2023-10-21 DIAGNOSIS — L97812 Non-pressure chronic ulcer of other part of right lower leg with fat layer exposed: Secondary | ICD-10-CM | POA: Diagnosis not present

## 2023-10-22 LAB — COPPER, SERUM: Copper: 88 ug/dL (ref 69–132)

## 2023-10-24 LAB — METHYLMALONIC ACID, SERUM: Methylmalonic Acid, Quantitative: 228 nmol/L (ref 0–378)

## 2023-10-28 ENCOUNTER — Encounter (HOSPITAL_BASED_OUTPATIENT_CLINIC_OR_DEPARTMENT_OTHER): Attending: General Surgery | Admitting: General Surgery

## 2023-10-28 ENCOUNTER — Other Ambulatory Visit: Payer: Self-pay

## 2023-10-28 DIAGNOSIS — Z86718 Personal history of other venous thrombosis and embolism: Secondary | ICD-10-CM | POA: Insufficient documentation

## 2023-10-28 DIAGNOSIS — I10 Essential (primary) hypertension: Secondary | ICD-10-CM | POA: Diagnosis not present

## 2023-10-28 DIAGNOSIS — I890 Lymphedema, not elsewhere classified: Secondary | ICD-10-CM | POA: Insufficient documentation

## 2023-10-28 DIAGNOSIS — I2510 Atherosclerotic heart disease of native coronary artery without angina pectoris: Secondary | ICD-10-CM | POA: Insufficient documentation

## 2023-10-28 DIAGNOSIS — L97812 Non-pressure chronic ulcer of other part of right lower leg with fat layer exposed: Secondary | ICD-10-CM | POA: Insufficient documentation

## 2023-10-28 DIAGNOSIS — L942 Calcinosis cutis: Secondary | ICD-10-CM | POA: Diagnosis not present

## 2023-10-28 DIAGNOSIS — I872 Venous insufficiency (chronic) (peripheral): Secondary | ICD-10-CM | POA: Diagnosis not present

## 2023-10-29 NOTE — Consult Note (Signed)
 Chief Complaint: Thrombocytopenia and macrocytic anemia. Request is for bone marrow biopsy   Referring Physician(s): Briant Cedar  Supervising Physician: {Supervising Physician:[id number]}  Patient Status: [facility] - Out-pt  History of Present Illness: [name] is a 73 y.o. adult History of right bundle branch block, HTN, HLD, DVT ( on eliquis) , OA . Found to have worsening thrombocytopenia and macrocytic anemia. Team is requesting a bone marrow biopsy for further evaluation.   Currently without any significant complaints. Patient alert and laying in bed,calm. Denies any fevers, headache, chest pain, SOB, cough, abdominal pain, nausea, vomiting or bleeding.   Patient is on eliquis. All other labs and medications are within acceptable parameters. NKDA. Patient has been NPO since midnight.   Return precautions and treatment recommendations and follow-up discussed with the patient *** who is agreeable with the plan.    Past Medical History:  Diagnosis Date   Adenomatous colon polyp - diminutive 06/2019   no recall needed   Bilateral lower extremity edema    Coronary artery disease    a. s/p BMS to LAD (1998) b. s/p DES to RCA (06/24/14)   DVT (deep venous thrombosis) (HCC)    12/2020 L femoral and poplitea.  Unprovoked.  hypercoag w/u NEG.   Eliquis.   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 ?inclusion body myositis? Power WC as of 2022   Macrocytic anemia    Estab with cone hem/onc 08/2022   Osteoarthritis, multiple sites    Palatal fistula    Palpable abd. aorta    Aortic u/s NO ANEURISM 2020   RBBB    Spinal stenosis    Dr Louanna Raw  surgery   Thrombocytopenia [facility])     Past Surgical History:  Procedure  Laterality Date   APPENDECTOMY  2010   CERVICAL FUSION  09/2009   CHOLECYSTECTOMY  1998   COLONOSCOPY     2020-->no recall   EYE SURGERY     Lasik   HYDROCELE EXCISION  2005   LEFT HEART CATHETERIZATION WITH CORONARY ANGIOGRAM N/A 06/24/2014   Procedure: LEFT HEART CATHETERIZATION WITH CORONARY ANGIOGRAM;  Surgeon: Lennette Bihari, MD;  Location: [facility] CATH LAB;  Service: Cardiovascular;  Laterality: N/A;   LUMBAR SPINE SURGERY  11/2011   MUSCLE BIOPSY Left 07/11/2018   Procedure: Left Vastus lateralus muscle biopsy;  Surgeon: Jadene Pierini, MD;  Location: [facility] OR;  Service: Neurosurgery;  Laterality: Left;  Left Vastus lateralus muscle biopsy   PERCUTANEOUS CORONARY STENT INTERVENTION (PCI-S)  06/24/2014   DES to RCA. Procedure: PERCUTANEOUS CORONARY STENT INTERVENTION (PCI-S);  Surgeon: Lennette Bihari, MD;  Location[facility] CATH LAB;  Service: Cardiovascular;;  Distal RCA   Rhythm monitoring     04/2023, occ pvc's   TONSILLECTOMY AND ADENOIDECTOMY  2012   TOTAL HIP ARTHROPLASTY Right 2021   RIGHT   TRANSTHORACIC ECHOCARDIOGRAM     03/2019 normal.  Nov 2024 mild RV dysf, o/w normal    Allergies: Patient has no known allergies.  Medications: Prior to Admission medications   Medication Sig Start Date End Date Taking? Authorizing Provider  acetaminophen (TYLENOL) 500 MG tablet Take by mouth as needed  for moderate pain or mild pain.    [provider]  apixaban (ELIQUIS) 2.5 MG TABS tablet Take 1 tablet (2.5 mg total) by mouth 2 (two) times daily. 07/04/23   Pricilla Riffle, MD  atenolol (TENORMIN) 25 MG tablet Take 1 tablet (25 mg total) by mouth 2 (two) times daily. 05/09/23   Pricilla Riffle, MD  b complex vitamins tablet Take 1 tablet by mouth daily.    [provider]  cholecalciferol (VITAMIN D) 1000 units tablet Take 1,000 Units by mouth daily.    [provider]  Coenzyme Q10 (CO Q 10) 100 MG CAPS Take 100 mg by mouth daily.     [provider]   ezetimibe (ZETIA) 10 MG tablet Take 1 tablet (10 mg total) by mouth daily. 07/25/23   Pricilla Riffle, MD  folic acid (FOLVITE) 800 MCG tablet Take 800 mcg by mouth daily.     [provider]  furosemide (LASIX) 40 MG tablet Take 1 tablet (40 mg total) by mouth every other day. 06/14/23   Pricilla Riffle, MD  glucosamine-chondroitin 500-400 MG tablet Take 2 tablets by mouth daily.     [provider]  ibuprofen (ADVIL,MOTRIN) 800 MG tablet Take 800 mg by mouth daily.     [provider]  Boris Lown Oil 500 MG CAPS Take 500 mg by mouth daily.     [provider]  levofloxacin (LEVAQUIN) 750 MG tablet Take 750 mg by mouth daily. 04/29/23   [provider]  lisinopril (ZESTRIL) 10 MG tablet Take 1 tablet (10 mg total) by mouth daily. 05/27/23   Pricilla Riffle, MD  Melatonin 10 MG TABS Take 20 mg by mouth at bedtime.    [provider]  potassium chloride (KLOR-CON) 10 MEQ tablet Take 1 tablet (10 mEq total) by mouth every other day. 06/14/23   Pricilla Riffle, MD  rosuvastatin (CRESTOR) 5 MG tablet TAKE 1 TABLET (5 MG TOTAL) BY MOUTH DAILY. 09/12/23   Pricilla Riffle, MD  Saw Palmetto, Serenoa repens, (SAW PALMETTO PO) Take 300 mg by mouth daily.    [provider]  tamsulosin (FLOMAX) 0.4 MG CAPS capsule Take 1 capsule (0.4 mg total) by mouth daily. 06/22/23   McGowen, Maryjean Morn, MD  triamcinolone cream (KENALOG) 0.1 % Apply 1 application topically as directed. 04/24/18   Pricilla Riffle, MD     Family History  Problem Relation Age of Onset   Heart attack Mother    Esophageal cancer Mother    Aneurysm Father    Heart disease Father    Cystic fibrosis Brother        no prior health issues, rare form of cystic fibrosis of the lungs (aug 2016)   Stroke Neg Hx     Social History   Socioeconomic History   Marital status: Married    Spouse name: Not on file   Number of children: Not on file   Years of education: Not on file   Highest education  level: 3rd grade  Occupational History   Not on file  Tobacco Use   Smoking status: Former    Current packs/day: 0.00    Average packs/day: 0.8 packs/day for 23.0 years (17.3 ttl pk-yrs)    Types: Cigarettes    Start date: 07/26/1966    Quit date: 07/26/1989    Years since quitting: 34.2   Smokeless tobacco: Never  Vaping Use   Vaping status: Never Used  Substance and  Sexual Activity   Alcohol use: Yes    Comment: 3-6 cans per week   Drug use: No   Sexual activity: Yes    Partners: Female    Birth control/protection: None    Comment: married  Other Topics Concern   Not on file  Social History Narrative   Married, 2 kids (one is Teacher, early years/pre at [employer]).   Educ: HS   Retired Dance movement psychotherapist   Patient is a former smoker, quit age 60   Alcohol use - yes, hx beer drinker, has approx 6 pack per week   Drug Use- no    caffeine 3 cups coffee daily   Enjoys motorcycles   Social Drivers of Corporate investment banker Strain: Low Risk  (06/10/2023)   Overall Financial Resource Strain (CARDIA)    Difficulty of Paying Living Expenses: Not hard at all  Food Insecurity: No Food Insecurity (06/10/2023)   Hunger Vital Sign    Worried About Running Out of Food in the Last Year: Never true    Ran Out of Food in the Last Year: Never true  Transportation Needs: No Transportation Needs (12/22/2022)   PRAPARE - Administrator, Civil Service (Medical): No    Lack of Transportation (Non-Medical): No  Physical Activity: Inactive (06/10/2023)   Exercise Vital Sign    Days of Exercise per Week: 0 days    Minutes of Exercise per Session: 0 min  Stress: No Stress Concern Present (06/10/2023)   [employer] of Occupational Health - Occupational Stress Questionnaire    Feeling of Stress : Not at all  Social Connections: Socially Isolated (06/10/2023)   Social Connection and Isolation Panel [NHANES]    Frequency of Communication with Friends and Family: Once a week    Frequency  of Social Gatherings with Friends and Family: Once a week    Attends Religious Services: Never    Database administrator or Organizations: No    Attends Banker Meetings: Never    Marital Status: Married    ECOG Status: {CHL ONC ECOG ZO:[id number]}  Review of Systems: A 12 point ROS discussed and pertinent positives are indicated in the HPI above.  All other systems are negative.  Review of Systems  Vital Signs: There were no vitals taken for this visit.  Advance Care Plan: {Advance Care WJXB:14782}    Physical Exam  Imaging: No results found.  Labs:  CBC: Recent Labs    03/04/23 1336 05/05/23 1514 05/30/23 1416 10/19/23 1219  WBC 5.7 5.0 6.3 3.7*  HGB 12.7* 12.8* 13.2 12.5*  HCT 39.6 37.7 39.2 38.1*  PLT 111* 92* 85* 54*    COAGS: No results for input(s): "INR", "APTT" in the last 8760 hours.  BMP: Recent Labs    03/04/23 1336 05/05/23 1514 05/30/23 1416 06/27/23 1444 10/19/23 1219  NA 141 142 144 143 141  K 4.0 4.6 4.5 5.0 4.5  CL 104 104 103 104 103  CO2 32 25 25 26 30   GLUCOSE 128* 103* 81 88 88  BUN 18 17 17 26 23   CALCIUM 8.9 9.1 9.2 9.1 9.1  CREATININE 0.96 0.88 0.91 1.06 0.95  GFRNONAA >60  --   --   --  >60    LIVER FUNCTION TESTS: Recent Labs    03/04/23 1336 10/19/23 1219  BILITOT 0.4 0.7  AST 23 21  ALT 18 13  ALKPHOS 56 38  PROT 6.7 6.3*  ALBUMIN 3.7 3.9    TUMOR MARKERS:  No results for input(s): "AFPTM", "CEA", "CA199", "CHROMGRNA" in the last 8760 hours.  Assessment and Plan:  73 y.o. male outpatient.  History of right bundle branch block, HTN, HLD, DVT ( on eliquis) , OA . Found to have worsening thrombocytopenia and macrocytic anemia. Team is requesting a bone marrow biopsy for further evaluation.   PLAN: IR Image Guided Bone Marrow Biopsy   Risks and benefits of bone marrow biopsy  was discussed with the patient and/or patient's family including, but not limited to bleeding, infection, damage to  adjacent structures or low yield requiring additional tests.  All of the questions were answered and there is agreement to proceed.  Consent signed and in chart.   Thank you for this interesting consult.  I greatly enjoyed meeting Lum Stillinger and look forward to participating in their care.  A copy of this report was sent to the requesting provider on this date.  Electronically Signed: Alene Mires, NP 10/29/2023, 7:45 PM   I spent a total of {New WUJW:[id number]} {New Out-Pt:[id number]}  {Established Out-Pt:[id number]} in face to face in clinical consultation, greater than 50% of which was counseling/coordinating care for ***

## 2023-10-31 ENCOUNTER — Ambulatory Visit (HOSPITAL_COMMUNITY)
Admission: RE | Admit: 2023-10-31 | Discharge: 2023-10-31 | Disposition: A | Discharge status: Home / Self Care | Source: Ambulatory Visit | Attending: Physician Assistant | Admitting: Physician Assistant

## 2023-10-31 VITALS — BP 124/65 | HR 58 | Temp 98.0°F | Resp 26 | Ht 70.5 in | Wt 280.0 lb

## 2023-10-31 DIAGNOSIS — Z86718 Personal history of other venous thrombosis and embolism: Secondary | ICD-10-CM | POA: Diagnosis not present

## 2023-10-31 DIAGNOSIS — Z7901 Long term (current) use of anticoagulants: Secondary | ICD-10-CM | POA: Insufficient documentation

## 2023-10-31 DIAGNOSIS — Z87891 Personal history of nicotine dependence: Secondary | ICD-10-CM | POA: Diagnosis not present

## 2023-10-31 DIAGNOSIS — D539 Nutritional anemia, unspecified: Secondary | ICD-10-CM | POA: Diagnosis not present

## 2023-10-31 DIAGNOSIS — D696 Thrombocytopenia, unspecified: Secondary | ICD-10-CM | POA: Diagnosis not present

## 2023-10-31 DIAGNOSIS — D704 Cyclic neutropenia: Secondary | ICD-10-CM | POA: Diagnosis not present

## 2023-10-31 LAB — CBC WITH DIFFERENTIAL/PLATELET
Abs Immature Granulocytes: 0.02 10*3/uL (ref 0.00–0.07)
Basophils Absolute: 0 10*3/uL (ref 0.0–0.1)
Basophils Relative: 0 %
Eosinophils Absolute: 0.3 10*3/uL (ref 0.0–0.5)
Eosinophils Relative: 7 %
HCT: 38.9 % — ABNORMAL LOW (ref 39.0–52.0)
Hemoglobin: 12.7 g/dL — ABNORMAL LOW (ref 13.0–17.0)
Immature Granulocytes: 1 %
Lymphocytes Relative: 20 %
Lymphs Abs: 0.9 10*3/uL (ref 0.7–4.0)
MCH: 34.9 pg — ABNORMAL HIGH (ref 26.0–34.0)
MCHC: 32.6 g/dL (ref 30.0–36.0)
MCV: 106.9 fL — ABNORMAL HIGH (ref 80.0–100.0)
Monocytes Absolute: 0.5 10*3/uL (ref 0.1–1.0)
Monocytes Relative: 11 %
Neutro Abs: 2.7 10*3/uL (ref 1.7–7.7)
Neutrophils Relative %: 61 %
Platelets: 71 10*3/uL — ABNORMAL LOW (ref 150–400)
RBC: 3.64 MIL/uL — ABNORMAL LOW (ref 4.22–5.81)
RDW: 13.3 % (ref 11.5–15.5)
WBC: 4.4 10*3/uL (ref 4.0–10.5)
nRBC: 0 % (ref 0.0–0.2)

## 2023-10-31 MED ORDER — FENTANYL CITRATE (PF) 100 MCG/2ML IJ SOLN
INTRAMUSCULAR | Status: AC
  Filled 2023-10-31: qty 2

## 2023-10-31 MED ORDER — MIDAZOLAM HCL 2 MG/2ML IJ SOLN
INTRAMUSCULAR | Status: AC | PRN
Start: 2023-10-31 — End: 2023-10-31
  Administered 2023-10-31 (×2): 1 mg via INTRAVENOUS

## 2023-10-31 MED ORDER — LIDOCAINE HCL 1 % IJ SOLN
10.0000 mL | Freq: Once | INTRAMUSCULAR | Status: AC
  Administered 2023-10-31: 10 mL via INTRADERMAL

## 2023-10-31 MED ORDER — FENTANYL CITRATE (PF) 100 MCG/2ML IJ SOLN
INTRAMUSCULAR | Status: AC | PRN
  Administered 2023-10-31 (×2): 50 ug via INTRAVENOUS

## 2023-10-31 MED ORDER — MIDAZOLAM HCL 2 MG/2ML IJ SOLN
INTRAMUSCULAR | Status: AC
  Filled 2023-10-31: qty 2

## 2023-10-31 NOTE — Procedures (Signed)
Interventional Radiology Procedure Note  Procedure: CT BM ASP AND CORE    Complications: None  Estimated Blood Loss:  MIN  Findings: 11 G CORE AND ASP    M. TREVOR Chrstopher Malenfant, MD    

## 2023-11-03 LAB — SURGICAL PATHOLOGY

## 2023-11-04 ENCOUNTER — Encounter (HOSPITAL_BASED_OUTPATIENT_CLINIC_OR_DEPARTMENT_OTHER): Admitting: Internal Medicine

## 2023-11-04 DIAGNOSIS — L942 Calcinosis cutis: Secondary | ICD-10-CM | POA: Diagnosis not present

## 2023-11-04 DIAGNOSIS — I10 Essential (primary) hypertension: Secondary | ICD-10-CM | POA: Diagnosis not present

## 2023-11-04 DIAGNOSIS — Z86718 Personal history of other venous thrombosis and embolism: Secondary | ICD-10-CM | POA: Diagnosis not present

## 2023-11-04 DIAGNOSIS — L97812 Non-pressure chronic ulcer of other part of right lower leg with fat layer exposed: Secondary | ICD-10-CM | POA: Diagnosis not present

## 2023-11-04 DIAGNOSIS — I872 Venous insufficiency (chronic) (peripheral): Secondary | ICD-10-CM | POA: Diagnosis not present

## 2023-11-04 DIAGNOSIS — I890 Lymphedema, not elsewhere classified: Secondary | ICD-10-CM | POA: Diagnosis not present

## 2023-11-08 ENCOUNTER — Encounter (HOSPITAL_COMMUNITY): Payer: Self-pay | Admitting: Physician Assistant

## 2023-11-10 ENCOUNTER — Inpatient Hospital Stay: Attending: Internal Medicine

## 2023-11-10 ENCOUNTER — Other Ambulatory Visit: Payer: Self-pay | Admitting: Hematology and Oncology

## 2023-11-10 ENCOUNTER — Inpatient Hospital Stay (HOSPITAL_BASED_OUTPATIENT_CLINIC_OR_DEPARTMENT_OTHER): Admitting: Hematology and Oncology

## 2023-11-10 VITALS — BP 150/70 | HR 61 | Temp 97.7°F | Resp 13 | Wt 289.9 lb

## 2023-11-10 DIAGNOSIS — Z7901 Long term (current) use of anticoagulants: Secondary | ICD-10-CM | POA: Diagnosis not present

## 2023-11-10 DIAGNOSIS — Z86718 Personal history of other venous thrombosis and embolism: Secondary | ICD-10-CM | POA: Insufficient documentation

## 2023-11-10 DIAGNOSIS — D539 Nutritional anemia, unspecified: Secondary | ICD-10-CM

## 2023-11-10 DIAGNOSIS — D696 Thrombocytopenia, unspecified: Secondary | ICD-10-CM | POA: Diagnosis not present

## 2023-11-10 LAB — CBC WITH DIFFERENTIAL (CANCER CENTER ONLY)
Abs Immature Granulocytes: 0.01 10*3/uL (ref 0.00–0.07)
Basophils Absolute: 0 10*3/uL (ref 0.0–0.1)
Basophils Relative: 0 %
Eosinophils Absolute: 0.2 10*3/uL (ref 0.0–0.5)
Eosinophils Relative: 8 %
HCT: 37.2 % — ABNORMAL LOW (ref 39.0–52.0)
Hemoglobin: 12.3 g/dL — ABNORMAL LOW (ref 13.0–17.0)
Immature Granulocytes: 0 %
Lymphocytes Relative: 24 %
Lymphs Abs: 0.8 10*3/uL (ref 0.7–4.0)
MCH: 34.4 pg — ABNORMAL HIGH (ref 26.0–34.0)
MCHC: 33.1 g/dL (ref 30.0–36.0)
MCV: 103.9 fL — ABNORMAL HIGH (ref 80.0–100.0)
Monocytes Absolute: 0.4 10*3/uL (ref 0.1–1.0)
Monocytes Relative: 12 %
Neutro Abs: 1.8 10*3/uL (ref 1.7–7.7)
Neutrophils Relative %: 56 %
Platelet Count: 60 10*3/uL — ABNORMAL LOW (ref 150–400)
RBC: 3.58 MIL/uL — ABNORMAL LOW (ref 4.22–5.81)
RDW: 13.2 % (ref 11.5–15.5)
WBC Count: 3.2 10*3/uL — ABNORMAL LOW (ref 4.0–10.5)
nRBC: 0 % (ref 0.0–0.2)

## 2023-11-10 LAB — CMP (CANCER CENTER ONLY)
ALT: 14 U/L (ref 0–44)
AST: 21 U/L (ref 15–41)
Albumin: 3.9 g/dL (ref 3.5–5.0)
Alkaline Phosphatase: 39 U/L (ref 38–126)
Anion gap: 5 (ref 5–15)
BUN: 27 mg/dL — ABNORMAL HIGH (ref 8–23)
CO2: 32 mmol/L (ref 22–32)
Calcium: 9.1 mg/dL (ref 8.9–10.3)
Chloride: 105 mmol/L (ref 98–111)
Creatinine: 0.93 mg/dL (ref 0.61–1.24)
GFR, Estimated: >60 mL/min (ref >60)
Glucose, Bld: 88 mg/dL (ref 70–99)
Potassium: 4.3 mmol/L (ref 3.5–5.1)
Sodium: 142 mmol/L (ref 135–145)
Total Bilirubin: 0.6 mg/dL (ref 0.0–1.2)
Total Protein: 6.1 g/dL — ABNORMAL LOW (ref 6.5–8.1)

## 2023-11-10 NOTE — Progress Notes (Signed)
 [facility] [phone]   [phone]  PROGRESS NOTE  Patient Care Team: Shelvia Dick, MD as PCP - General (Family Medicine) Elmyra Haggard, MD as PCP - Cardiology (Cardiology) Elmyra Haggard, MD as Consulting Physician (Cardiology) Gaynelle Keeling, MD as Consulting Physician (Dermatology)  Hematological/Oncological History 1) 01/02/2021: Presented to ED in [place] due to swelling and redness of the legs x 8 days.  Doppler US  revealed nonocclusive deep venous thrombus involving the left common femoral vein, proximal femoral vein and popliteal vein extending to the calf trifurcation. Stared Eliquis  therapy  2) 01/29/2022: Hypercoagulable panel ordered and did not show underlying clotting disorder.   3) 03/03/2022: Establish care with [facility] with Dr. Amparo Balk and Wyline Hearing PA-C  CHIEF COMPLAINTS/PURPOSE OF CONSULTATION:  Hx of DVT currently on Eliquis  2. Thrombocytopenia 3. Macrocytic anemia  HISTORY OF PRESENTING ILLNESS:  Fields Oros 73 y.o. adult returns for a follow up for history of DVT and macrocytic anemia/thrombocytopenia. He was last seen by Dr. Rosaline Coma on 03/03/2074. He is unaccompanied for this visit.   On exam today, Mr. Alberico reports he tolerated his bone marrow biopsy well with no major issues.  He reports that he does have chronic bruising on his arms which has not significantly worsened.  He reports is been there since he started the Plavix in 2015.  He notes that his muscle disease remains steady with no recent worsening.  His appetite is good and he is doing his best to try to eat steak and red meat.  He has lost about 6 pounds in weight and he is unsure how this is happening.  He reports he is not having any trouble with bleeding, bruising, or dark stools.  He is tolerating his Eliquis  therapy well and is taking it faithfully.  His energy levels have been at baseline.  A full 10 point ROS is otherwise negative.  MEDICAL HISTORY:   Past Medical History:  Diagnosis Date   Adenomatous colon polyp - diminutive 06/2019   no recall needed   Bilateral lower extremity edema    Coronary artery disease    a. s/p BMS to LAD (1998) b. s/p DES to RCA (06/24/14)   DVT (deep venous thrombosis) (HCC)    12/2020 L femoral and poplitea.  Unprovoked.  hypercoag w/u NEG.   Eliquis .   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 ?inclusion body myositis? Power WC as of 2022   Macrocytic anemia    Estab with cone hem/onc 08/2022   Osteoarthritis, multiple sites    Palatal fistula    Palpable abd. aorta    Aortic u/s NO ANEURISM 2020   RBBB    Spinal stenosis    Dr Nolan Battle  surgery   Thrombocytopenia [facility])     SURGICAL HISTORY: Past Surgical History:  Procedure Laterality Date   APPENDECTOMY  2010   CERVICAL FUSION  09/2009   CHOLECYSTECTOMY  1998   COLONOSCOPY     2020-->no recall   EYE SURGERY     Lasik   HYDROCELE EXCISION  2005   LEFT HEART CATHETERIZATION WITH CORONARY ANGIOGRAM N/A 06/24/2014  Procedure: LEFT HEART CATHETERIZATION WITH CORONARY ANGIOGRAM;  Surgeon: Millicent Ally, MD;  Location: [facility] CATH LAB;  Service: Cardiovascular;  Laterality: N/A;   LUMBAR SPINE SURGERY  11/2011   MUSCLE BIOPSY Left 07/11/2018   Procedure: Left Vastus lateralus muscle biopsy;  Surgeon: Cannon Champion, MD;  Location: [facility] OR;  Service: Neurosurgery;  Laterality: Left;  Left Vastus lateralus muscle biopsy   PERCUTANEOUS CORONARY STENT INTERVENTION (PCI-S)  06/24/2014   DES to RCA. Procedure: PERCUTANEOUS CORONARY STENT INTERVENTION (PCI-S);  Surgeon: Millicent Ally, MD;  Location: [facility] CATH LAB;  Service: Cardiovascular;;  Distal RCA   Rhythm monitoring     04/2023, occ  pvc's   TONSILLECTOMY AND ADENOIDECTOMY  2012   TOTAL HIP ARTHROPLASTY Right 2021   RIGHT   TRANSTHORACIC ECHOCARDIOGRAM     03/2019 normal.  Nov 2024 mild RV dysf, o/w normal    SOCIAL HISTORY: Social History   Socioeconomic History   Marital status: Married    Spouse name: Not on file   Number of children: Not on file   Years of education: Not on file   Highest education level: 3rd grade  Occupational History   Not on file  Tobacco Use   Smoking status: Former    Current packs/day: 0.00    Average packs/day: 0.8 packs/day for 23.0 years (17.3 ttl pk-yrs)    Types: Cigarettes    Start date: 07/26/1966    Quit date: 07/26/1989    Years since quitting: 34.3   Smokeless tobacco: Never  Vaping Use   Vaping status: Never Used  Substance and Sexual Activity   Alcohol use: Yes    Comment: 3-6 cans per week   Drug use: No   Sexual activity: Yes    Partners: Female    Birth control/protection: None    Comment: married  Other Topics Concern   Not on file  Social History Narrative   Married, 2 kids (one is Teacher, early years/pre at [employer]).   Educ: HS   Retired Dance movement psychotherapist   Patient is a former smoker, quit age 67   Alcohol use - yes, hx beer drinker, has approx 6 pack per week   Drug Use- no    caffeine 3 cups coffee daily   Enjoys motorcycles   Social Drivers of Corporate investment banker Strain: Low Risk  (06/10/2023)   Overall Financial Resource Strain (CARDIA)    Difficulty of Paying Living Expenses: Not hard at all  Food Insecurity: No Food Insecurity (06/10/2023)   Hunger Vital Sign    Worried About Running Out of Food in the Last Year: Never true    Ran Out of Food in the Last Year: Never true  Transportation Needs: No Transportation Needs (12/22/2022)   PRAPARE - Administrator, Civil Service (Medical): No    Lack of Transportation (Non-Medical): No  Physical Activity: Inactive (06/10/2023)   Exercise Vital Sign    Days of Exercise per Week: 0 days     Minutes of Exercise per Session: 0 min  Stress: No Stress Concern Present (06/10/2023)   [employer] of Occupational Health - Occupational Stress Questionnaire    Feeling of Stress : Not at all  Social Connections: Socially Isolated (06/10/2023)   Social Connection and Isolation Panel [NHANES]    Frequency of Communication with Friends and Family: Once a week    Frequency of Social Gatherings with Friends and Family: Once a week    Attends Religious Services: Never  Active Member of Clubs or Organizations: No    Attends Banker Meetings: Never    Marital Status: Married  Catering manager Violence: Not At Risk (12/22/2022)   Humiliation, Afraid, Rape, and Kick questionnaire    Fear of Current or Ex-Partner: No    Emotionally Abused: No    Physically Abused: No    Sexually Abused: No    FAMILY HISTORY: Family History  Problem Relation Age of Onset   Heart attack Mother    Esophageal cancer Mother    Aneurysm Father    Heart disease Father    Cystic fibrosis Brother        no prior health issues, rare form of cystic fibrosis of the lungs (aug 2016)   Stroke Neg Hx     ALLERGIES:  has no known allergies.  MEDICATIONS:  Current Outpatient Medications  Medication Sig Dispense Refill   acetaminophen  (TYLENOL ) 500 MG tablet Take by mouth as needed for moderate pain or mild pain.     apixaban  (ELIQUIS ) 2.5 MG TABS tablet Take 1 tablet (2.5 mg total) by mouth 2 (two) times daily. 180 tablet 3   atenolol  (TENORMIN ) 25 MG tablet Take 1 tablet (25 mg total) by mouth 2 (two) times daily. 180 tablet 3   b complex vitamins tablet Take 1 tablet by mouth daily.     cholecalciferol (VITAMIN D) 1000 units tablet Take 1,000 Units by mouth daily.     Coenzyme Q10 (CO Q 10) 100 MG CAPS Take 100 mg by mouth daily.      ezetimibe  (ZETIA ) 10 MG tablet Take 1 tablet (10 mg total) by mouth daily. 90 tablet 3   folic acid  (FOLVITE ) 800 MCG tablet Take 800 mcg by mouth daily.       furosemide  (LASIX ) 40 MG tablet Take 1 tablet (40 mg total) by mouth every other day.     glucosamine-chondroitin 500-400 MG tablet Take 2 tablets by mouth daily.      ibuprofen (ADVIL,MOTRIN) 800 MG tablet Take 800 mg by mouth daily.      Krill Oil 500 MG CAPS Take 500 mg by mouth daily.      levofloxacin (LEVAQUIN) 750 MG tablet Take 750 mg by mouth daily.     lisinopril  (ZESTRIL ) 10 MG tablet Take 1 tablet (10 mg total) by mouth daily. 90 tablet 3   potassium chloride  (KLOR-CON ) 10 MEQ tablet Take 1 tablet (10 mEq total) by mouth every other day.     rosuvastatin  (CRESTOR ) 5 MG tablet TAKE 1 TABLET (5 MG TOTAL) BY MOUTH DAILY. 90 tablet 1   Saw Palmetto, Serenoa repens, (SAW PALMETTO PO) Take 300 mg by mouth daily.     tamsulosin  (FLOMAX ) 0.4 MG CAPS capsule Take 1 capsule (0.4 mg total) by mouth daily. 90 capsule 1   No current facility-administered medications for this visit.    REVIEW OF SYSTEMS:   Constitutional: ( - ) fevers, ( - )  chills , ( - ) night sweats Eyes: ( - ) blurriness of vision, ( - ) double vision, ( - ) watery eyes Ears, nose, mouth, throat, and face: ( - ) mucositis, ( - ) sore throat Respiratory: ( - ) cough, ( - ) dyspnea, ( - ) wheezes Cardiovascular: ( - ) palpitation, ( - ) chest discomfort, ( + ) lower extremity swelling Gastrointestinal:  ( - ) nausea, ( - ) heartburn, ( - ) change in bowel habits Skin: ( - ) abnormal skin rashes  Lymphatics: ( - ) new lymphadenopathy, ( - ) easy bruising Neurological: ( - ) numbness, ( - ) tingling, ( - ) new weaknesses Behavioral/Psych: ( - ) mood change, ( - ) new changes  All other systems were reviewed with the patient and are negative.  PHYSICAL EXAMINATION: ECOG PERFORMANCE STATUS: 1 - Symptomatic but completely ambulatory  Vitals:   11/10/23 0837  BP: (!) 150/70  Pulse: 61  Resp: 13  Temp: 97.7 F (36.5 C)  SpO2: 100%     Filed Weights   11/10/23 0837  Weight: 289 lb 14.4 oz (131.5 kg)       GENERAL: well appearing male in NAD  SKIN: skin color, texture, turgor are normal, no rashes or significant lesions EYES: conjunctiva are pink and non-injected, sclera clear LUNGS: clear to auscultation and percussion with normal breathing effort HEART: regular rate & rhythm and no murmurs. Bilateral lower extremity edema, wrapped so unable to assess ulcerations.  ABDOMEN: soft, non-tender, non-distended, normal bowel sounds Musculoskeletal: no cyanosis of digits and no clubbing  PSYCH: alert & oriented x 3, fluent speech NEURO: no focal motor/sensory deficits  LABORATORY DATA:  I have reviewed the data as listed    Latest Ref Rng & Units 11/10/2023    8:07 AM 10/31/2023    7:51 AM 10/19/2023   12:19 PM  CBC  WBC 4.0 - 10.5 K/uL 3.2  4.4  3.7   Hemoglobin 13.0 - 17.0 g/dL 82.9  56.2  13.0   Hematocrit 39.0 - 52.0 % 37.2  38.9  38.1   Platelets 150 - 400 K/uL 60  71  54        Latest Ref Rng & Units 11/10/2023    8:07 AM 10/19/2023   12:19 PM 06/27/2023    2:44 PM  CMP  Glucose 70 - 99 mg/dL 88  88  88   BUN 8 - 23 mg/dL 27  23  26    Creatinine 0.61 - 1.24 mg/dL 8.65  7.84  6.96   Sodium 135 - 145 mmol/L 142  141  143   Potassium 3.5 - 5.1 mmol/L 4.3  4.5  5.0   Chloride 98 - 111 mmol/L 105  103  104   CO2 22 - 32 mmol/L 32  30  26   Calcium  8.9 - 10.3 mg/dL 9.1  9.1  9.1   Total Protein 6.5 - 8.1 g/dL 6.1  6.3    Total Bilirubin 0.0 - 1.2 mg/dL 0.6  0.7    Alkaline Phos 38 - 126 U/L 39  38    AST 15 - 41 U/L 21  21    ALT 0 - 44 U/L 14  13      RADIOGRAPHIC STUDIES: I have personally reviewed the radiological images as listed and agreed with the findings in the report. CT BONE MARROW BIOPSY & ASPIRATION Result Date: 10/31/2023 INDICATION: THROMBOCYTOPENIA AND ANEMIA EXAM: CT GUIDED RIGHT ILIAC BONE MARROW ASPIRATION AND CORE BIOPSY Date:  10/31/2023 10/31/2023 12:20 pm Radiologist:  M. Alyssa Jumper, MD Guidance:  CT FLUOROSCOPY: Fluoroscopy Time: None. MEDICATIONS: 1%  lidocaine  local ANESTHESIA/SEDATION: 2.0 mg IV Versed ; 100 mcg IV Fentanyl  Moderate Sedation Time:  11 minute The patient was continuously monitored during the procedure by the interventional radiology nurse under my direct supervision. CONTRAST:  None. COMPLICATIONS: None PROCEDURE: Informed consent was obtained from the patient following explanation of the procedure, risks, benefits and alternatives. The patient understands, agrees and consents for the procedure. All questions were  addressed. A time out was performed. The patient was positioned prone and non-contrast localization CT was performed of the pelvis to demonstrate the iliac marrow spaces. Maximal barrier sterile technique utilized including caps, mask, sterile gowns, sterile gloves, large sterile drape, hand hygiene, and Betadine prep. Under sterile conditions and local anesthesia, an 11 gauge coaxial bone biopsy needle was advanced into the right iliac marrow space. Needle position was confirmed with CT imaging. Initially, bone marrow aspiration was performed. Next, the 11 gauge outer cannula was utilized to obtain a right iliac bone marrow core biopsy. Needle was removed. Hemostasis was obtained with compression. The patient tolerated the procedure well. Samples were prepared with the cytotechnologist. No immediate complications. IMPRESSION: CT guided right iliac bone marrow aspiration and core biopsy. Electronically Signed   By: Melven Stable.  Shick M.D.   On: 10/31/2023 13:15    ASSESSMENT & PLAN Alberto Pina is a 73 y.o. adult returns for a follow up for history of DVT while on Eliquis  therapy.   #Unprovoked DVT --findings at this time are consistent with a unprovoked VTE --currently on Eliquis  2.5 mg PO twice daily. Recommend to continue --patient denies any bleeding, bruising, or dark stools on this medication. It is well tolerated. No difficulties accessing/affording the medication --RTC in 6 months' time with strict return precautions for overt  signs of bleeding.   #Macrocytic anemia #Thrombocytopenia: --Workup from 03/12/2022 ruled out nutritional deficiencies, paraproteinemia,  hepatitis B/C, HIV --Abdominal US  from 03/25/2022 showed mild fatty liver otherwise no other liver disease or splenomegaly --Labs today show white blood cell 3.2, hemoglobin 12.3, MCV 103.9, platelets of 60 --Discussed monitoring moving forward given that his bone marrow biopsy performed on 10/31/2023 showed no evidence of bone marrow disorder. -- We have not been able to identify a cause for his macrocytic anemia and thrombocytopenia.  Nutritional labs have been within normal limits.  Follow up: --RTC in 6 months to re-evaluate  No orders of the defined types were placed in this encounter.   All questions were answered. The patient knows to call the clinic with any problems, questions or concerns.  I have spent a total of 30 minutes minutes of face-to-face and non-face-to-face time, preparing to see the patient, obtaining and/or reviewing separately obtained history, performing a medically appropriate examination, counseling and educating the patient, documenting clinical information in the electronic health record and care coordination.   [name] Clay, MD Department of Hematology/Oncology [facility] Phone: [phone] Pager: [phone] Email: [email or web address]

## 2023-11-11 ENCOUNTER — Encounter (HOSPITAL_BASED_OUTPATIENT_CLINIC_OR_DEPARTMENT_OTHER): Admitting: General Surgery

## 2023-11-11 DIAGNOSIS — I10 Essential (primary) hypertension: Secondary | ICD-10-CM | POA: Diagnosis not present

## 2023-11-11 DIAGNOSIS — I890 Lymphedema, not elsewhere classified: Secondary | ICD-10-CM | POA: Diagnosis not present

## 2023-11-11 DIAGNOSIS — Z86718 Personal history of other venous thrombosis and embolism: Secondary | ICD-10-CM | POA: Diagnosis not present

## 2023-11-11 DIAGNOSIS — L942 Calcinosis cutis: Secondary | ICD-10-CM | POA: Diagnosis not present

## 2023-11-11 DIAGNOSIS — I872 Venous insufficiency (chronic) (peripheral): Secondary | ICD-10-CM | POA: Diagnosis not present

## 2023-11-11 DIAGNOSIS — L97812 Non-pressure chronic ulcer of other part of right lower leg with fat layer exposed: Secondary | ICD-10-CM | POA: Diagnosis not present

## 2023-11-18 ENCOUNTER — Encounter (HOSPITAL_BASED_OUTPATIENT_CLINIC_OR_DEPARTMENT_OTHER): Admitting: General Surgery

## 2023-11-18 DIAGNOSIS — L97812 Non-pressure chronic ulcer of other part of right lower leg with fat layer exposed: Secondary | ICD-10-CM | POA: Diagnosis not present

## 2023-11-18 DIAGNOSIS — I10 Essential (primary) hypertension: Secondary | ICD-10-CM | POA: Diagnosis not present

## 2023-11-18 DIAGNOSIS — I890 Lymphedema, not elsewhere classified: Secondary | ICD-10-CM | POA: Diagnosis not present

## 2023-11-18 DIAGNOSIS — L942 Calcinosis cutis: Secondary | ICD-10-CM | POA: Diagnosis not present

## 2023-11-18 DIAGNOSIS — I872 Venous insufficiency (chronic) (peripheral): Secondary | ICD-10-CM | POA: Diagnosis not present

## 2023-11-18 DIAGNOSIS — Z86718 Personal history of other venous thrombosis and embolism: Secondary | ICD-10-CM | POA: Diagnosis not present

## 2023-11-25 ENCOUNTER — Encounter (HOSPITAL_BASED_OUTPATIENT_CLINIC_OR_DEPARTMENT_OTHER): Attending: General Surgery | Admitting: General Surgery

## 2023-11-25 DIAGNOSIS — L97812 Non-pressure chronic ulcer of other part of right lower leg with fat layer exposed: Secondary | ICD-10-CM | POA: Insufficient documentation

## 2023-11-25 DIAGNOSIS — L942 Calcinosis cutis: Secondary | ICD-10-CM | POA: Diagnosis not present

## 2023-11-25 DIAGNOSIS — I10 Essential (primary) hypertension: Secondary | ICD-10-CM | POA: Insufficient documentation

## 2023-11-25 DIAGNOSIS — L97512 Non-pressure chronic ulcer of other part of right foot with fat layer exposed: Secondary | ICD-10-CM | POA: Insufficient documentation

## 2023-11-25 DIAGNOSIS — I890 Lymphedema, not elsewhere classified: Secondary | ICD-10-CM | POA: Diagnosis not present

## 2023-11-25 DIAGNOSIS — I2510 Atherosclerotic heart disease of native coronary artery without angina pectoris: Secondary | ICD-10-CM | POA: Diagnosis not present

## 2023-11-25 DIAGNOSIS — Z86718 Personal history of other venous thrombosis and embolism: Secondary | ICD-10-CM | POA: Insufficient documentation

## 2023-11-25 DIAGNOSIS — I872 Venous insufficiency (chronic) (peripheral): Secondary | ICD-10-CM | POA: Insufficient documentation

## 2023-11-25 DIAGNOSIS — Z8619 Personal history of other infectious and parasitic diseases: Secondary | ICD-10-CM | POA: Diagnosis not present

## 2023-12-02 ENCOUNTER — Encounter (HOSPITAL_BASED_OUTPATIENT_CLINIC_OR_DEPARTMENT_OTHER): Admitting: Internal Medicine

## 2023-12-02 DIAGNOSIS — L97512 Non-pressure chronic ulcer of other part of right foot with fat layer exposed: Secondary | ICD-10-CM | POA: Diagnosis not present

## 2023-12-02 DIAGNOSIS — I10 Essential (primary) hypertension: Secondary | ICD-10-CM | POA: Diagnosis not present

## 2023-12-02 DIAGNOSIS — I890 Lymphedema, not elsewhere classified: Secondary | ICD-10-CM | POA: Diagnosis not present

## 2023-12-02 DIAGNOSIS — L942 Calcinosis cutis: Secondary | ICD-10-CM | POA: Diagnosis not present

## 2023-12-02 DIAGNOSIS — L97812 Non-pressure chronic ulcer of other part of right lower leg with fat layer exposed: Secondary | ICD-10-CM | POA: Diagnosis not present

## 2023-12-02 DIAGNOSIS — I872 Venous insufficiency (chronic) (peripheral): Secondary | ICD-10-CM | POA: Diagnosis not present

## 2023-12-09 ENCOUNTER — Encounter (HOSPITAL_BASED_OUTPATIENT_CLINIC_OR_DEPARTMENT_OTHER): Admitting: Internal Medicine

## 2023-12-09 DIAGNOSIS — L942 Calcinosis cutis: Secondary | ICD-10-CM | POA: Diagnosis not present

## 2023-12-09 DIAGNOSIS — L97512 Non-pressure chronic ulcer of other part of right foot with fat layer exposed: Secondary | ICD-10-CM | POA: Diagnosis not present

## 2023-12-09 DIAGNOSIS — I10 Essential (primary) hypertension: Secondary | ICD-10-CM | POA: Diagnosis not present

## 2023-12-09 DIAGNOSIS — I872 Venous insufficiency (chronic) (peripheral): Secondary | ICD-10-CM | POA: Diagnosis not present

## 2023-12-09 DIAGNOSIS — I890 Lymphedema, not elsewhere classified: Secondary | ICD-10-CM | POA: Diagnosis not present

## 2023-12-09 DIAGNOSIS — L97812 Non-pressure chronic ulcer of other part of right lower leg with fat layer exposed: Secondary | ICD-10-CM | POA: Diagnosis not present

## 2023-12-12 ENCOUNTER — Encounter: Payer: Self-pay | Admitting: Family Medicine

## 2023-12-12 ENCOUNTER — Ambulatory Visit (INDEPENDENT_AMBULATORY_CARE_PROVIDER_SITE_OTHER): Payer: Medicare Other | Admitting: Family Medicine

## 2023-12-12 ENCOUNTER — Other Ambulatory Visit: Payer: Self-pay

## 2023-12-12 ENCOUNTER — Other Ambulatory Visit (HOSPITAL_BASED_OUTPATIENT_CLINIC_OR_DEPARTMENT_OTHER): Payer: Self-pay

## 2023-12-12 VITALS — BP 128/75 | HR 62 | Temp 98.1°F | Ht 70.5 in | Wt 292.8 lb

## 2023-12-12 DIAGNOSIS — N401 Enlarged prostate with lower urinary tract symptoms: Secondary | ICD-10-CM | POA: Diagnosis not present

## 2023-12-12 DIAGNOSIS — R0609 Other forms of dyspnea: Secondary | ICD-10-CM

## 2023-12-12 DIAGNOSIS — I10 Essential (primary) hypertension: Secondary | ICD-10-CM

## 2023-12-12 DIAGNOSIS — R351 Nocturia: Secondary | ICD-10-CM

## 2023-12-12 DIAGNOSIS — E7800 Pure hypercholesterolemia, unspecified: Secondary | ICD-10-CM | POA: Diagnosis not present

## 2023-12-12 DIAGNOSIS — R600 Localized edema: Secondary | ICD-10-CM | POA: Diagnosis not present

## 2023-12-12 DIAGNOSIS — F5101 Primary insomnia: Secondary | ICD-10-CM | POA: Diagnosis not present

## 2023-12-12 LAB — LIPID PANEL
Cholesterol: 99 mg/dL (ref 0–200)
HDL: 53.4 mg/dL (ref >39.00)
LDL Cholesterol: 36 mg/dL (ref 0–99)
NonHDL: 45.48
Total CHOL/HDL Ratio: 2
Triglycerides: 45 mg/dL (ref 0.0–149.0)
VLDL: 9 mg/dL (ref 0.0–40.0)

## 2023-12-12 LAB — BASIC METABOLIC PANEL WITH GFR
BUN: 29 mg/dL — ABNORMAL HIGH (ref 6–23)
CO2: 30 meq/L (ref 19–32)
Calcium: 8.7 mg/dL (ref 8.4–10.5)
Chloride: 103 meq/L (ref 96–112)
Creatinine, Ser: 0.82 mg/dL (ref 0.40–1.50)
GFR: 86.03 mL/min (ref >60.00)
Glucose, Bld: 81 mg/dL (ref 70–99)
Potassium: 4.3 meq/L (ref 3.5–5.1)
Sodium: 141 meq/L (ref 135–145)

## 2023-12-12 LAB — MAGNESIUM: Magnesium: 2.1 mg/dL (ref 1.5–2.5)

## 2023-12-12 MED ORDER — TAMSULOSIN HCL 0.4 MG PO CAPS
0.4000 mg | ORAL_CAPSULE | Freq: Every day | ORAL | 1 refills | Status: DC
  Filled 2023-12-12: qty 90, 90d supply, fill #0

## 2023-12-12 MED ORDER — TEMAZEPAM 7.5 MG PO CAPS
7.5000 mg | ORAL_CAPSULE | Freq: Every evening | ORAL | 0 refills | Status: DC | PRN
  Filled 2023-12-12: qty 30, 15d supply, fill #0

## 2023-12-12 NOTE — Progress Notes (Signed)
 OFFICE VISIT  12/12/2023  CC:  Chief Complaint  Patient presents with   Medical Management of Chronic Issues    Pt is fasting    Patient is a 73 y.o. adult who presents for 65-month follow-up hypertension, BPH, hyperlipidemia and bilateral lower extremity edema. A/P as of last visit: "#1 sleep dysfunction.  A lot of this is a result of nocturia. Unfortunately he does have to take Lasix  regularly for his lower extremity edema. Benadryl  helps sufficiently so he will continue with this.   #2 BPH with waxing and waning lower urinary tract obstructive symptoms. Since he states Flomax  on a as needed basis was very helpful in the past I will go ahead and prescribe this 0.4 mg.   #3 hypertension, doing well on a atenolol  25 mg twice daily and lisinopril  10 mg a day. Electrolytes and creatinine normal earlier this month.   4. hypercholesterolemia, stable on Zetia  10 mg a day and rosuvastatin  5 mg a day. LDL was 70 last month at cardiology.   5. Inclusion body myositis. Diffuse weakness, only minimally ambulatory.  Power wheelchair use."  INTERIM HX: [place] still has a lot of trouble sleeping.  He does feel like the tamsulosin  has helped with his nocturia and his daytime BPH symptoms. His lifelong insomnia persists.  He is ready to try medication.  He states he has never been on a prescription sleep medicine.  His cardiologist has him on 40 mg of Lasix  every other day.  Some days he skips due to appointments, etc.  Not too long ago he skipped several days in a row and when he started retaking it he did 80 mg a day for about 5 days.  He noticed during that time that he had much less dyspnea on exertion.  He is followed by hematology/oncology and his most recent labs about 2 months ago showed a significant drop in his platelets from baseline.  They chose to get a bone marrow biopsy.  This showed no evidence of bone marrow disorder.   PMP AWARE reviewed today: nothing is listed. No red  flags.   Past Medical History:  Diagnosis Date   Adenomatous colon polyp - diminutive 06/2019   no recall needed   Bilateral lower extremity edema    Coronary artery disease    a. s/p BMS to LAD (1998) b. s/p DES to RCA (06/24/14)   DVT (deep venous thrombosis) (HCC)    12/2020 L femoral and poplitea.  Unprovoked.  hypercoag w/u NEG.   Eliquis .   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 ?inclusion body myositis? Power WC as of 2022   Macrocytic anemia    Estab with cone hem/onc 08/2022   Osteoarthritis, multiple sites    Palatal fistula    Palpable abd. aorta    Aortic u/s NO ANEURISM 2020   RBBB    Spinal stenosis    Dr Nolan Battle  surgery   Thrombocytopenia [facility])    Bone marrow biopsy benign 2025    Past Surgical History:  Procedure Laterality Date   APPENDECTOMY  2010   BONE MARROW BIOPSY     2025 benign  CERVICAL FUSION  09/2009   CHOLECYSTECTOMY  1998   COLONOSCOPY     2020-->no recall   EYE SURGERY     Lasik   HYDROCELE EXCISION  2005   LEFT HEART CATHETERIZATION WITH CORONARY ANGIOGRAM N/A 06/24/2014   Procedure: LEFT HEART CATHETERIZATION WITH CORONARY ANGIOGRAM;  Surgeon: Millicent Ally, MD;  Location: [name] CATH LAB;  Service: Cardiovascular;  Laterality: N/A;   LUMBAR SPINE SURGERY  11/2011   MUSCLE BIOPSY Left 07/11/2018   Procedure: Left Vastus lateralus muscle biopsy;  Surgeon: Cannon Champion, MD;  Location: [facility] OR;  Service: Neurosurgery;  Laterality: Left;  Left Vastus lateralus muscle biopsy   PERCUTANEOUS CORONARY STENT INTERVENTION (PCI-S)  06/24/2014   DES to RCA. Procedure: PERCUTANEOUS CORONARY STENT INTERVENTION (PCI-S);  Surgeon: Millicent Ally, MD;  Location: [facility] CATH LAB;  Service:  Cardiovascular;;  Distal RCA   Rhythm monitoring     04/2023, occ pvc's   TONSILLECTOMY AND ADENOIDECTOMY  2012   TOTAL HIP ARTHROPLASTY Right 2021   RIGHT   TRANSTHORACIC ECHOCARDIOGRAM     03/2019 normal.  Nov 2024 mild RV dysf, o/w normal    Outpatient Medications Prior to Visit  Medication Sig Dispense Refill   acetaminophen  (TYLENOL ) 500 MG tablet Take by mouth as needed for moderate pain or mild pain.     apixaban  (ELIQUIS ) 2.5 MG TABS tablet Take 1 tablet (2.5 mg total) by mouth 2 (two) times daily. 180 tablet 3   atenolol  (TENORMIN ) 25 MG tablet Take 1 tablet (25 mg total) by mouth 2 (two) times daily. 180 tablet 3   b complex vitamins tablet Take 1 tablet by mouth daily.     cholecalciferol (VITAMIN D) 1000 units tablet Take 1,000 Units by mouth daily.     Coenzyme Q10 (CO Q 10) 100 MG CAPS Take 100 mg by mouth daily.      ezetimibe  (ZETIA ) 10 MG tablet Take 1 tablet (10 mg total) by mouth daily. 90 tablet 3   folic acid  (FOLVITE ) 800 MCG tablet Take 800 mcg by mouth daily.      furosemide  (LASIX ) 40 MG tablet Take 1 tablet (40 mg total) by mouth every other day.     glucosamine-chondroitin 500-400 MG tablet Take 2 tablets by mouth daily.      ibuprofen (ADVIL,MOTRIN) 800 MG tablet Take 800 mg by mouth daily.      Krill Oil 500 MG CAPS Take 500 mg by mouth daily.      levofloxacin (LEVAQUIN) 750 MG tablet Take 750 mg by mouth daily.     lisinopril  (ZESTRIL ) 10 MG tablet Take 1 tablet (10 mg total) by mouth daily. 90 tablet 3   potassium chloride  (KLOR-CON ) 10 MEQ tablet Take 1 tablet (10 mEq total) by mouth every other day.     rosuvastatin  (CRESTOR ) 5 MG tablet TAKE 1 TABLET (5 MG TOTAL) BY MOUTH DAILY. 90 tablet 1   Saw Palmetto, Serenoa repens, (SAW PALMETTO PO) Take 300 mg by mouth daily.     tamsulosin  (FLOMAX ) 0.4 MG CAPS capsule Take 1 capsule (0.4 mg total) by mouth daily. 90 capsule 1   No facility-administered medications prior to visit.    No Known  Allergies  Review of Systems As per HPI  PE:    12/12/2023   10:39 AM 11/10/2023    8:37 AM 10/31/2023   12:53 PM  Vitals with BMI  Height 5' 10.5"    Weight 292 lbs 13 oz 289 lbs 14 oz  BMI 41.4 40.99   Systolic 128 150 161  Diastolic 75 70 65  Pulse 62 61 58   Physical Exam  Gen: Alert, well appearing.  Patient is oriented to person, place, time, and situation. AFFECT: pleasant, lucid thought and speech. Sitting in wheelchair, both lower extremities wrapped. No further exam today.  LABS:  Last CBC Lab Results  Component Value Date   WBC 3.2 (L) 11/10/2023   HGB 12.3 (L) 11/10/2023   HCT 37.2 (L) 11/10/2023   MCV 103.9 (H) 11/10/2023   MCH 34.4 (H) 11/10/2023   RDW 13.2 11/10/2023   PLT 60 (L) 11/10/2023   Last metabolic panel Lab Results  Component Value Date   GLUCOSE 88 11/10/2023   NA 142 11/10/2023   K 4.3 11/10/2023   CL 105 11/10/2023   CO2 32 11/10/2023   BUN 27 (H) 11/10/2023   CREATININE 0.93 11/10/2023   GFRNONAA >60 11/10/2023   CALCIUM  9.1 11/10/2023   PROT 6.1 (L) 11/10/2023   ALBUMIN 3.9 11/10/2023   LABGLOB 2.9 03/04/2023   AGRATIO 1.6 12/31/2020   BILITOT 0.6 11/10/2023   ALKPHOS 39 11/10/2023   AST 21 11/10/2023   ALT 14 11/10/2023   ANIONGAP 5 11/10/2023   Last lipids Lab Results  Component Value Date   CHOL 116 12/31/2020   HDL 28 (L) 12/31/2020   LDLCALC 70 12/31/2020   LDLDIRECT 65 05/16/2019   TRIG 92 12/31/2020   CHOLHDL 4.1 12/31/2020   Last hemoglobin A1c Lab Results  Component Value Date   HGBA1C 5.3 05/05/2023   Last thyroid  functions Lab Results  Component Value Date   TSH 1.600 05/05/2023   Last vitamin B12 and Folate Lab Results  Component Value Date   VITAMINB12 374 10/19/2023   FOLATE >40.0 10/19/2023   IMPRESSION AND PLAN:  #1 hypertension, well-controlled on atenolol  25 mg twice daily and lisinopril  10 mg daily. Monitor electrolytes and creatinine today.  2.  Hypercholesterolemia, doing well  long-term on Crestor  5 mg a day and Zetia  10 mg a day. Monitor lipids today.  Hepatic panel 1 month ago normal.  #3 BPH, his most prominent symptom is nocturia.  He has shown some response to the tamsulosin  0.4 mg dose.  Will increase to 0.8 mg dose.  4.  Chronic insomnia. Trial of Restoril  7.5 mg, 1-2 nightly as needed.  #15, no refill. Follow-up 2 weeks.  #5 bilateral lower extremity edema. He has chronic venous stasis ulcers, history of recurrent infection.  He has been on Levaquin long-term as well as followed by the wound clinic.  #6 dyspnea on exertion.  Possibly related to some diastolic dysfunction plus minus RV dysfunction. Last echo November 2024 showed mild RV dysfunction, otherwise normal. He seemed to benefit more from 80 mg of Lasix  daily rather than 40 mg every other day. Will check electrolytes and creatinine today and as long as this is stable he can go ahead and take 80 mg of Lasix  every other day as his scheduled dosing. He does take potassium 10 mEQ on lasix  days.  An After Visit Summary was printed and given to the patient.  FOLLOW UP: Return in about 2 weeks (around 12/26/2023) for f/u bph and insomnia.  Signed:  Arletha Lady, MD           12/12/2023

## 2023-12-13 ENCOUNTER — Other Ambulatory Visit (HOSPITAL_BASED_OUTPATIENT_CLINIC_OR_DEPARTMENT_OTHER): Payer: Self-pay

## 2023-12-13 ENCOUNTER — Other Ambulatory Visit: Payer: Self-pay

## 2023-12-13 ENCOUNTER — Ambulatory Visit: Payer: Self-pay | Admitting: Family Medicine

## 2023-12-13 MED ORDER — TAMSULOSIN HCL 0.4 MG PO CAPS
0.8000 mg | ORAL_CAPSULE | Freq: Every evening | ORAL | 3 refills | Status: DC
  Filled 2023-12-13: qty 180, 90d supply, fill #0

## 2023-12-13 NOTE — Telephone Encounter (Signed)
 My fault. I just now sent in a new prescription for 2 caps per night, #180.

## 2023-12-15 ENCOUNTER — Other Ambulatory Visit: Payer: Self-pay

## 2023-12-15 ENCOUNTER — Other Ambulatory Visit (HOSPITAL_BASED_OUTPATIENT_CLINIC_OR_DEPARTMENT_OTHER): Payer: Self-pay

## 2023-12-15 ENCOUNTER — Telehealth: Payer: Self-pay

## 2023-12-15 ENCOUNTER — Other Ambulatory Visit: Payer: Self-pay | Admitting: Family Medicine

## 2023-12-15 MED ORDER — LEVOFLOXACIN 750 MG PO TABS
750.0000 mg | ORAL_TABLET | Freq: Every day | ORAL | 3 refills | Status: DC
  Filled 2024-02-17: qty 90, 90d supply, fill #0

## 2023-12-15 MED ORDER — ESZOPICLONE 1 MG PO TABS
1.0000 mg | ORAL_TABLET | Freq: Every evening | ORAL | 0 refills | Status: DC | PRN
Start: 2023-12-15 — End: 2024-03-29
  Filled 2023-12-15: qty 30, 15d supply, fill #0

## 2023-12-15 NOTE — Telephone Encounter (Signed)
-----   Message from Shelvia Dick sent at 12/15/2023 11:50 AM EDT ----- Please call patient and tell him that the pharmacist reached out to me today and let me know that his temazepam  was not covered by insurance. I sent in a prescription for a different insomnia medication that they said would be very inexpensive called lunesta.  He should take 1 tab at bedtime but if this does not help then he can try 2 tabs .

## 2023-12-15 NOTE — Telephone Encounter (Signed)
 Pt was advised of provider instructions

## 2023-12-16 ENCOUNTER — Ambulatory Visit (HOSPITAL_BASED_OUTPATIENT_CLINIC_OR_DEPARTMENT_OTHER): Admitting: General Surgery

## 2023-12-23 ENCOUNTER — Encounter (HOSPITAL_BASED_OUTPATIENT_CLINIC_OR_DEPARTMENT_OTHER): Admitting: General Surgery

## 2023-12-23 DIAGNOSIS — L97812 Non-pressure chronic ulcer of other part of right lower leg with fat layer exposed: Secondary | ICD-10-CM | POA: Diagnosis not present

## 2023-12-23 DIAGNOSIS — L942 Calcinosis cutis: Secondary | ICD-10-CM | POA: Diagnosis not present

## 2023-12-23 DIAGNOSIS — I10 Essential (primary) hypertension: Secondary | ICD-10-CM | POA: Diagnosis not present

## 2023-12-23 DIAGNOSIS — I872 Venous insufficiency (chronic) (peripheral): Secondary | ICD-10-CM | POA: Diagnosis not present

## 2023-12-23 DIAGNOSIS — L97512 Non-pressure chronic ulcer of other part of right foot with fat layer exposed: Secondary | ICD-10-CM | POA: Diagnosis not present

## 2023-12-23 DIAGNOSIS — I890 Lymphedema, not elsewhere classified: Secondary | ICD-10-CM | POA: Diagnosis not present

## 2023-12-26 ENCOUNTER — Ambulatory Visit (INDEPENDENT_AMBULATORY_CARE_PROVIDER_SITE_OTHER): Admitting: Family Medicine

## 2023-12-26 ENCOUNTER — Encounter: Payer: Self-pay | Admitting: Family Medicine

## 2023-12-26 VITALS — BP 109/65 | HR 79 | Temp 98.5°F | Ht 70.5 in | Wt 295.4 lb

## 2023-12-26 DIAGNOSIS — N401 Enlarged prostate with lower urinary tract symptoms: Secondary | ICD-10-CM

## 2023-12-26 DIAGNOSIS — F5101 Primary insomnia: Secondary | ICD-10-CM | POA: Diagnosis not present

## 2023-12-26 DIAGNOSIS — J339 Nasal polyp, unspecified: Secondary | ICD-10-CM

## 2023-12-26 MED ORDER — TAMSULOSIN HCL 0.4 MG PO CAPS: ORAL_CAPSULE | ORAL | Status: DC

## 2023-12-26 NOTE — Progress Notes (Signed)
 OFFICE VISIT  12/26/2023  CC: No chief complaint on file.   Patient is a 73 y.o. adult who presents for 2-week follow-up BPH and insomnia. A/P as of last visit: "#1 hypertension, well-controlled on atenolol  25 mg twice daily and lisinopril  10 mg daily. Monitor electrolytes and creatinine today.   2.  Hypercholesterolemia, doing well long-term on Crestor  5 mg a day and Zetia  10 mg a day. Monitor lipids today.  Hepatic panel 1 month ago normal.   #3 BPH, his most prominent symptom is nocturia.  He has shown some response to the tamsulosin  0.4 mg dose.  Will increase to 0.8 mg dose.   4.  Chronic insomnia. Trial of Restoril  7.5 mg, 1-2 nightly as needed.  #15, no refill. Follow-up 2 weeks.   #5 bilateral lower extremity edema. He has chronic venous stasis ulcers, history of recurrent infection.  He has been on Levaquin  long-term as well as followed by the wound clinic.   #6 dyspnea on exertion.  Possibly related to some diastolic dysfunction plus minus RV dysfunction. Last echo November 2024 showed mild RV dysfunction, otherwise normal. He seemed to benefit more from 80 mg of Lasix  daily rather than 40 mg every other day. Will check electrolytes and creatinine today and as long as this is stable he can go ahead and take 80 mg of Lasix  every other day as his scheduled dosing. He does take potassium 10 mEQ on lasix  days."  INTERIM HX: All labs normal last visit. Insurance did not approve the Restoril  but one of their alternatives was Lunesta  so we started this. He has picked this medication up but has not started it yet. He is hesitant to start the medication, says he is working his nerve up.  He states that the 0.8 mg dose of Flomax  really did not make any difference compared to the 0.4 mg dose. About a week ago he started to feel obstruction of the right nasal passage.  He had increased runny nose for about a day and then this stopped. He feels like he has to push something up into  the right nostril from time to time.  No facial pressure or pain.  No cough or malaise or fever.  PMP AWARE reviewed today: most recent rx for Lunesta  1 mg was filled 12/15/2023, # 30, rx by me. No red flags.   Past Medical History:  Diagnosis Date   Adenomatous colon polyp - diminutive 06/2019   no recall needed   Bilateral lower extremity edema    Coronary artery disease    a. s/p BMS to LAD (1998) b. s/p DES to RCA (06/24/14)   DVT (deep venous thrombosis) (HCC)    12/2020 L femoral and poplitea.  Unprovoked.  hypercoag w/u NEG.   Eliquis .   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 ?inclusion body myositis? Power WC as of 2022   Macrocytic anemia    Estab with cone hem/onc 08/2022   Osteoarthritis, multiple sites    Palatal fistula    Palpable abd. aorta    Aortic u/s NO ANEURISM 2020   RBBB    Spinal stenosis    Dr Mercer Stakes  [facility]   Thrombocytopenia (HCC)    Bone marrow biopsy benign 2025    Past Surgical History:  Procedure Laterality Date   APPENDECTOMY  2010   BONE MARROW BIOPSY     2025 benign   CERVICAL FUSION  09/2009   CHOLECYSTECTOMY  1998   COLONOSCOPY     2020-->no recall   EYE SURGERY     Lasik   HYDROCELE EXCISION  2005   LEFT HEART CATHETERIZATION WITH CORONARY ANGIOGRAM N/A 06/24/2014   Procedure: LEFT HEART CATHETERIZATION WITH CORONARY ANGIOGRAM;  Surgeon: Millicent Ally, MD;  Location: [facility] CATH LAB;  Service: Cardiovascular;  Laterality: N/A;   LUMBAR SPINE SURGERY  11/2011   MUSCLE BIOPSY Left 07/11/2018   Procedure: Left Vastus lateralus muscle biopsy;  Surgeon: Cannon Champion, MD;  Location: [facility] OR;  Service: Neurosurgery;  Laterality: Left;  Left Vastus lateralus muscle biopsy    PERCUTANEOUS CORONARY STENT INTERVENTION (PCI-S)  06/24/2014   DES to RCA. Procedure: PERCUTANEOUS CORONARY STENT INTERVENTION (PCI-S);  Surgeon: Millicent Ally, MD;  Location: [facility] CATH LAB;  Service: Cardiovascular;;  Distal RCA   Rhythm monitoring     04/2023, occ pvc's   TONSILLECTOMY AND ADENOIDECTOMY  2012   TOTAL HIP ARTHROPLASTY Right 2021   RIGHT   TRANSTHORACIC ECHOCARDIOGRAM     03/2019 normal.  Nov 2024 mild RV dysf, o/w normal    Outpatient Medications Prior to Visit  Medication Sig Dispense Refill   acetaminophen  (TYLENOL ) 500 MG tablet Take by mouth as needed for moderate pain or mild pain.     apixaban  (ELIQUIS ) 2.5 MG TABS tablet Take 1 tablet (2.5 mg total) by mouth 2 (two) times daily. 180 tablet 3   atenolol  (TENORMIN ) 25 MG tablet Take 1 tablet (25 mg total) by mouth 2 (two) times daily. 180 tablet 3   b complex vitamins tablet Take 1 tablet by mouth daily.     cholecalciferol (VITAMIN D) 1000 units tablet Take 1,000 Units by mouth daily.     Coenzyme Q10 (CO Q 10) 100 MG CAPS Take 100 mg by mouth daily.      eszopiclone  (LUNESTA ) 1 MG TABS tablet Take 1-2 tablets (1-2 mg total) by mouth at bedtime as needed for insomnia 30 tablet 0   ezetimibe  (ZETIA ) 10 MG tablet Take 1 tablet (10 mg total) by mouth daily. 90 tablet 3   folic acid  (FOLVITE ) 800 MCG tablet Take 800 mcg by mouth daily.      furosemide  (LASIX ) 40 MG tablet Take 1 tablet (40 mg total) by mouth every other day.     glucosamine-chondroitin 500-400 MG tablet Take 2 tablets by mouth daily.      ibuprofen (ADVIL,MOTRIN) 800 MG tablet Take 800 mg by mouth daily.      Krill Oil 500 MG CAPS Take 500 mg by mouth daily.      levofloxacin  (LEVAQUIN ) 750 MG tablet Take 1 tablet (750 mg total) by mouth daily. 90 tablet 3   lisinopril  (ZESTRIL ) 10 MG tablet Take 1 tablet (10 mg total) by mouth daily. 90 tablet 3   potassium chloride  (KLOR-CON ) 10 MEQ tablet Take 1 tablet (10 mEq total) by mouth every other day.      rosuvastatin  (CRESTOR ) 5 MG tablet TAKE 1 TABLET (5 MG TOTAL) BY MOUTH DAILY. 90 tablet 1   Saw Palmetto, Serenoa repens, (SAW PALMETTO PO) Take 300 mg by mouth daily.     tamsulosin  (FLOMAX ) 0.4 MG CAPS capsule Take 2  capsules (0.8 mg total) by mouth at bedtime. 180 capsule 3   levofloxacin  (LEVAQUIN ) 750 MG tablet Take 750 mg by mouth daily.     No facility-administered medications prior to visit.    No Known Allergies  Review of Systems As per HPI  PE:    12/26/2023    1:18 PM 12/12/2023   10:39 AM 11/10/2023    8:37 AM  Vitals with BMI  Height 5' 10.5" 5' 10.5"   Weight 295 lbs 6 oz 292 lbs 13 oz 289 lbs 14 oz  BMI 41.77 41.4 40.99  Systolic 109 128 409  Diastolic 65 75 70  Pulse 79 62 61     Physical Exam  Gen: Alert, well appearing.  Patient is oriented to person, place, time, and situation. Left nostril without swelling or erythema.  Septum midline. Right nostril with polypoid enlargement of the inferior turbinate, essentially 90 to 100% obstructing the nostril.  No purulent mucus.  No erythema.  LABS:  Last CBC Lab Results  Component Value Date   WBC 3.2 (L) 11/10/2023   HGB 12.3 (L) 11/10/2023   HCT 37.2 (L) 11/10/2023   MCV 103.9 (H) 11/10/2023   MCH 34.4 (H) 11/10/2023   RDW 13.2 11/10/2023   PLT 60 (L) 11/10/2023   Lab Results  Component Value Date   IRON 48 05/16/2019   TIBC 256 05/16/2019   FERRITIN 531 (H) 05/16/2019   Last metabolic panel Lab Results  Component Value Date   GLUCOSE 81 12/12/2023   NA 141 12/12/2023   K 4.3 12/12/2023   CL 103 12/12/2023   CO2 30 12/12/2023   BUN 29 (H) 12/12/2023   CREATININE 0.82 12/12/2023   GFR 86.03 12/12/2023   CALCIUM  8.7 12/12/2023   PROT 6.1 (L) 11/10/2023   ALBUMIN 3.9 11/10/2023   LABGLOB 2.9 03/04/2023   AGRATIO 1.6 12/31/2020   BILITOT 0.6 11/10/2023   ALKPHOS 39 11/10/2023   AST 21 11/10/2023   ALT 14 11/10/2023   ANIONGAP 5 11/10/2023   Last lipids Lab Results  Component Value Date    CHOL 99 12/12/2023   HDL 53.40 12/12/2023   LDLCALC 36 12/12/2023   LDLDIRECT 65 05/16/2019   TRIG 45.0 12/12/2023   CHOLHDL 2 12/12/2023   Last hemoglobin A1c Lab Results  Component Value Date   HGBA1C 5.3 05/05/2023   Last thyroid  functions Lab Results  Component Value Date   TSH 1.600 05/05/2023   Last vitamin B12 and Folate Lab Results  Component Value Date   VITAMINB12 374 10/19/2023   FOLATE >40.0 10/19/2023   IMPRESSION AND PLAN:  #1 insomnia, chronic. He will eventually do a trial of Lunesta  1 mg nightly.  He has the medication on hand.  2.  BPH. No change in symptoms with a trial of increased dose of Flomax . He will go back to the 0.4 mg daily dose.  3.  Nasal polyp. Refer to ENT for further evaluation and management.  An After Visit Summary was printed and given to the patient.  FOLLOW UP: No follow-ups on file.  Signed:  Arletha Lady, MD           12/26/2023

## 2023-12-28 ENCOUNTER — Ambulatory Visit (INDEPENDENT_AMBULATORY_CARE_PROVIDER_SITE_OTHER): Payer: Medicare Other | Admitting: *Deleted

## 2023-12-28 DIAGNOSIS — Z0000 Encounter for general adult medical examination without abnormal findings: Secondary | ICD-10-CM

## 2023-12-28 NOTE — Progress Notes (Signed)
 Subjective:   [name] is a 73 y.o. male who presents for Medicare Annual/Subsequent preventive examination.  Visit Complete: Virtual I connected with  [name] on 12/28/23 by a audio enabled telemedicine application and verified that I am speaking with the correct person using two identifiers.  Patient Location: Home  Provider Location: Home Office  I discussed the limitations of evaluation and management by telemedicine. The patient expressed understanding and agreed to proceed.  Vital Signs: Because this visit was a virtual/telehealth visit, some criteria may be missing or patient reported. Any vitals not documented were not able to be obtained and vitals that have been documented are patient reported.  Patient Medicare AWV questionnaire was completed by the patient on 12-26-2023; I have confirmed that all information answered by patient is correct and no changes since this date.  Cardiac Risk Factors include: advanced age (>62men, >39 women);hypertension;family history of premature cardiovascular disease;obesity (BMI >30kg/m2)     Objective:     There were no vitals filed for this visit. There is no height or weight on file to calculate BMI.     12/28/2023   10:06 AM 10/31/2023    8:07 AM 12/22/2022   10:08 AM 05/22/2019   10:21 AM 07/10/2018    8:16 AM 02/15/2018    3:34 PM 06/24/2014   10:45 AM  Advanced Directives  Does Patient Have a Medical Advance Directive? No No No No No No No  Would patient like information on creating a medical advance directive? No - Patient declined No - Patient declined No - Patient declined  No - Patient declined No - Patient declined No - patient declined information    Current Medications (verified) Outpatient Encounter Medications as of 12/28/2023  Medication Sig   acetaminophen  (TYLENOL ) 500 MG tablet Take by mouth as needed for moderate pain or mild pain.   apixaban  (ELIQUIS ) 2.5 MG TABS tablet Take 1 tablet (2.5 mg total) by mouth 2  (two) times daily.   atenolol  (TENORMIN ) 25 MG tablet Take 1 tablet (25 mg total) by mouth 2 (two) times daily.   b complex vitamins tablet Take 1 tablet by mouth daily.   cholecalciferol (VITAMIN D) 1000 units tablet Take 1,000 Units by mouth daily.   Coenzyme Q10 (CO Q 10) 100 MG CAPS Take 100 mg by mouth daily.    eszopiclone  (LUNESTA ) 1 MG TABS tablet Take 1-2 tablets (1-2 mg total) by mouth at bedtime as needed for insomnia   ezetimibe  (ZETIA ) 10 MG tablet Take 1 tablet (10 mg total) by mouth daily.   folic acid  (FOLVITE ) 800 MCG tablet Take 800 mcg by mouth daily.    furosemide  (LASIX ) 40 MG tablet Take 1 tablet (40 mg total) by mouth every other day.   glucosamine-chondroitin 500-400 MG tablet Take 2 tablets by mouth daily.    ibuprofen (ADVIL,MOTRIN) 800 MG tablet Take 800 mg by mouth daily.    Krill Oil 500 MG CAPS Take 500 mg by mouth daily.    levofloxacin  (LEVAQUIN ) 750 MG tablet Take 1 tablet (750 mg total) by mouth daily.   lisinopril  (ZESTRIL ) 10 MG tablet Take 1 tablet (10 mg total) by mouth daily.   potassium chloride  (KLOR-CON ) 10 MEQ tablet Take 1 tablet (10 mEq total) by mouth every other day.   rosuvastatin  (CRESTOR ) 5 MG tablet TAKE 1 TABLET (5 MG TOTAL) BY MOUTH DAILY.   Saw Palmetto, Serenoa repens, (SAW PALMETTO PO) Take 300 mg by mouth daily.   tamsulosin  (FLOMAX ) 0.4  MG CAPS capsule 1 tab po qhs   No facility-administered encounter medications on file as of 12/28/2023.    Allergies (verified) Patient has no known allergies.   History: Past Medical History:  Diagnosis Date   Adenomatous colon polyp - diminutive 06/2019   no recall needed   Bilateral lower extremity edema    Coronary artery disease    a. s/p BMS to LAD (1998) b. s/p DES to RCA (06/24/14)   DVT (deep venous thrombosis) (HCC)    12/2020 L femoral and poplitea.  Unprovoked.  hypercoag w/u NEG.   Eliquis .   Dyspnea    Fungal infection of lung    hx valley fever 2000   Hay fever     Hemidiaphragm paralysis    right   HTN (hypertension)    Hyperlipidemia    Inclusion body myositis    Dx approx 2018, hx chronic CK elevation ( 415 CK, ALL MM CK subtype, seen by Rheumatology and considered normal Variant). ?inclusion body myositis? Power WC as of 2022   Macrocytic anemia    Estab with [facility]/onc 08/2022   Osteoarthritis, multiple sites    Palatal fistula    Palpable abd. aorta    Aortic u/s NO ANEURISM 2020   RBBB    Spinal stenosis    Dr [name]  surgery   Thrombocytopenia [facility])    Bone marrow biopsy benign 2025   Past Surgical History:  Procedure Laterality Date   APPENDECTOMY  2010   BONE MARROW BIOPSY     2025 benign   CERVICAL FUSION  09/2009   CHOLECYSTECTOMY  1998   COLONOSCOPY     2020-->no recall   EYE SURGERY     Lasik   HYDROCELE EXCISION  2005   LEFT HEART CATHETERIZATION WITH CORONARY ANGIOGRAM N/A 06/24/2014   Procedure: LEFT HEART CATHETERIZATION WITH CORONARY ANGIOGRAM;  Surgeon: [name] Ally, MD;  Location[facility] CATH LAB;  Service: Cardiovascular;  Laterality: N/A;   LUMBAR SPINE SURGERY  11/2011   MUSCLE BIOPSY Left 07/11/2018   Procedure: Left Vastus lateralus muscle biopsy;  Surgeon: [name] Champion, MD;  Location: [facility] OR;  Service: Neurosurgery;  Laterality: Left;  Left Vastus lateralus muscle biopsy   PERCUTANEOUS CORONARY STENT INTERVENTION (PCI-S)  06/24/2014   DES to RCA. Procedure: PERCUTANEOUS CORONARY STENT INTERVENTION (PCI-S);  Surgeon: [name] Ally, MD;  Location: [facility] CATH LAB;  Service: Cardiovascular;;  Distal RCA   Rhythm monitoring     04/2023, occ pvc's   TONSILLECTOMY AND ADENOIDECTOMY  2012   TOTAL HIP ARTHROPLASTY Right 2021   RIGHT   TRANSTHORACIC ECHOCARDIOGRAM     03/2019 normal.  Nov 2024 mild RV dysf, o/w normal   Family History  Problem Relation Age of Onset   Heart attack Mother    Esophageal cancer Mother    Aneurysm Father    Heart disease Father    Cystic fibrosis Brother        no prior  health issues, rare form of cystic fibrosis of the lungs (aug 2016)   Stroke Neg Hx    Social History   Socioeconomic History   Marital status: Married    Spouse name: Not on file   Number of children: Not on file   Years of education: Not on file   Highest education level: Never attended school  Occupational History   Not on file  Tobacco Use   Smoking status: Former    Current packs/day: 0.00  Average packs/day: 0.8 packs/day for 23.0 years (17.3 ttl pk-yrs)    Types: Cigarettes    Start date: 07/26/1966    Quit date: 07/26/1989    Years since quitting: 34.4   Smokeless tobacco: Never  Vaping Use   Vaping status: Never Used  Substance and Sexual Activity   Alcohol use: Yes    Comment: 3-6 cans per week   Drug use: No   Sexual activity: Yes    Partners: Female    Birth control/protection: None    Comment: married  Other Topics Concern   Not on file  Social History Narrative   Married, 2 kids (one is Teacher, early years/pre at [employer]).   Educ: HS   Retired Dance movement psychotherapist   Patient is a former smoker, quit age 73   Alcohol use - yes, hx beer drinker, has approx 6 pack per week   Drug Use- no    caffeine 3 cups coffee daily   Enjoys motorcycles   Social Drivers of Corporate investment banker Strain: Low Risk  (12/28/2023)   Overall Financial Resource Strain (CARDIA)    Difficulty of Paying Living Expenses: Not hard at all  Food Insecurity: No Food Insecurity (12/28/2023)   Hunger Vital Sign    Worried About Running Out of Food in the Last Year: Never true    Ran Out of Food in the Last Year: Never true  Transportation Needs: No Transportation Needs (12/28/2023)   PRAPARE - Administrator, Civil Service (Medical): No    Lack of Transportation (Non-Medical): No  Physical Activity: Inactive (12/28/2023)   Exercise Vital Sign    Days of Exercise per Week: 0 days    Minutes of Exercise per Session: 0 min  Stress: No Stress Concern Present (12/28/2023)   [employer] of Occupational Health - Occupational Stress Questionnaire    Feeling of Stress : Not at all  Social Connections: Socially Isolated (12/28/2023)   Social Connection and Isolation Panel [NHANES]    Frequency of Communication with Friends and Family: Once a week    Frequency of Social Gatherings with Friends and Family: Once a week    Attends Religious Services: Never    Database administrator or Organizations: No    Attends Engineer, structural: Never    Marital Status: Married    Tobacco Counseling Counseling given: Not Answered   Clinical Intake:  Pre-visit preparation completed: Yes  Pain : No/denies pain     Diabetes: No  How often do you need to have someone help you when you read instructions, pamphlets, or other written materials from your doctor or pharmacy?: 1 - Never  Interpreter Needed?: No  Information entered by :: Kieth Pelt LPN   Activities of Daily Living    12/28/2023   10:06 AM 12/26/2023    5:25 PM  In your present state of health, do you have any difficulty performing the following activities:  Hearing? 1 1  Vision? 0 0  Difficulty concentrating or making decisions? 0 0  Walking or climbing stairs? 1 1  Dressing or bathing? 0 0  Doing errands, shopping? 0 0  Preparing Food and eating ? N N  Using the Toilet? N N  In the past six months, have you accidently leaked urine? N N  Do you have problems with loss of bowel control? N N  Managing your Medications? N N  Managing your Finances? N N  Housekeeping or managing your Housekeeping? N N  Patient Care Team: Shelvia Dick, MD as PCP - General (Family Medicine) Elmyra Haggard, MD as PCP - Cardiology (Cardiology) Elmyra Haggard, MD as Consulting Physician (Cardiology) Gaynelle Keeling, MD as Consulting Physician (Dermatology)  Indicate any recent Medical Services you may have received from other than [facility] providers in the past year (date may be approximate).      Assessment:    This is a routine wellness examination for Clester.  Hearing/Vision screen Hearing Screening - Comments:: Bilateral hearing aids Vision Screening - Comments:: Not up to date   Goals Addressed             This Visit's Progress    Patient Stated       No goals at this time       Depression Screen    12/28/2023    9:38 AM 12/12/2023   10:42 AM 12/22/2022   10:07 AM 12/09/2022    1:22 PM 12/09/2022    1:14 PM 05/22/2019   10:21 AM 05/16/2019    1:29 PM  PHQ 2/9 Scores  PHQ - 2 Score 0 0 0 1 1 0 1  PHQ- 9 Score 4  0 4 4  6     Fall Risk    12/28/2023   10:06 AM 12/26/2023    5:25 PM 12/12/2023   10:42 AM 06/10/2023    5:50 PM 12/22/2022   10:09 AM  Fall Risk   Falls in the past year? 0 0 0 1 0  Number falls in past yr: 0 0 0 0 0  Injury with Fall? 0 0 0 0 0  Risk for fall due to :   No Fall Risks  Impaired vision;Impaired mobility;Impaired balance/gait  Follow up Falls evaluation completed;Education provided;Falls prevention discussed  Falls evaluation completed  Falls prevention discussed    MEDICARE RISK AT HOME: Medicare Risk at Home If so, are there any without handrails?: No Home free of loose throw rugs in walkways, pet beds, electrical cords, etc?: No Adequate lighting in your home to reduce risk of falls?: Yes Life alert?: No Use of a cane, walker or w/c?: Yes Grab bars in the bathroom?: Yes Shower chair or bench in shower?: Yes Elevated toilet seat or a handicapped toilet?: No  TIMED UP AND GO:  Was the test performed?  No    Cognitive Function:        12/28/2023    9:35 AM 12/22/2022   10:11 AM  6CIT Screen  What Year? 0 points 0 points  What month? 0 points 0 points  What time? 0 points 0 points  Count back from 20 0 points 0 points  Months in reverse 0 points 0 points  Repeat phrase 0 points 0 points  Total Score 0 points 0 points    Immunizations Immunization History  Administered Date(s) Administered   Fluad Quad(high Dose  65+) 05/16/2019   Fluad Trivalent(High Dose 65+) 06/13/2023   Influenza, High Dose Seasonal PF 04/27/2018   Influenza,inj,Quad PF,6+ Mos 06/25/2014, 04/26/2016   PNEUMOCOCCAL CONJUGATE-20 12/06/2020    TDAP status: Up to date  Flu Vaccine status: Up to date  Pneumococcal vaccine status: Up to date  Covid-19 vaccine status: Information provided on how to obtain vaccines.   Qualifies for Shingles Vaccine? Yes   Zostavax completed No   Shingrix  Completed?: No.    Education has been provided regarding the importance of this vaccine. Patient has been advised to call insurance company to determine out of pocket expense if  they have not yet received this vaccine. Advised may also receive vaccine at local pharmacy or Health Dept. Verbalized acceptance and understanding.  Screening Tests Health Maintenance  Topic Date Due   DEXA SCAN  Never done   COVID-19 Vaccine (1) 12/28/2023 (Originally 08/08/1955)   Zoster Vaccines- Shingrix  (1 of 2) 03/13/2024 (Originally 08/09/1969)   DTaP/Tdap/Td (1 - Tdap) 06/12/2024 (Originally 08/09/1969)   INFLUENZA VACCINE  02/24/2024   Medicare Annual Wellness (AWV)  12/27/2024   Colonoscopy  06/26/2029   Pneumonia Vaccine 43+ Years old  Completed   Hepatitis C Screening  Completed   HPV VACCINES  Aged Out   Meningococcal B Vaccine  Aged Out    Health Maintenance  Health Maintenance Due  Topic Date Due   DEXA SCAN  Never done    Colorectal cancer screening: No longer required.   Lung Cancer Screening: (Low Dose CT Chest recommended if Age 7-80 years, 20 pack-year currently smoking OR have quit w/in 15years.) does not qualify.   Lung Cancer Screening Referral:   Additional Screening:  Hepatitis C Screening: does not qualify; Completed 2023  Vision Screening: Recommended annual ophthalmology exams for early detection of glaucoma and other disorders of the eye. Is the patient up to date with their annual eye exam?  No  Who is the provider or what  is the name of the office in which the patient attends annual eye exams? Education provided If pt is not established with a provider, would they like to be referred to a provider to establish care? No .   Dental Screening: Recommended annual dental exams for proper oral hygiene   Community Resource Referral / Chronic Care Management: CRR required this visit?  No   CCM required this visit?  No     Plan:     I have personally reviewed and noted the following in the patient's chart:   Medical and social history Use of alcohol, tobacco or illicit drugs  Current medications and supplements including opioid prescriptions. Patient is not currently taking opioid prescriptions. Functional ability and status Nutritional status Physical activity Advanced directives List of other physicians Hospitalizations, surgeries, and ER visits in previous 12 months Vitals Screenings to include cognitive, depression, and falls Referrals and appointments  In addition, I have reviewed and discussed with patient certain preventive protocols, quality metrics, and best practice recommendations. A written personalized care plan for preventive services as well as general preventive health recommendations were provided to patient.     Kieth Pelt, LPN   07/31/1094   After Visit Summary: (MyChart) Due to this being a telephonic visit, the after visit summary with patients personalized plan was offered to patient via MyChart   Nurse Notes:

## 2023-12-28 NOTE — Patient Instructions (Signed)
 [name] , Thank you for taking time to come for your Medicare Wellness Visit. I appreciate your ongoing commitment to your health goals. Please review the following plan we discussed and let me know if I can assist you in the future.   Screening recommendations/referrals: Colonoscopy: no longer required Recommended yearly ophthalmology/optometry visit for glaucoma screening and checkup Recommended yearly dental visit for hygiene and checkup  Vaccinations: Influenza vaccine: up to date Pneumococcal vaccine: up to date Tdap vaccine: up to date due 05-2025  Education provided      Preventive Care 65 Years and Older, Male Preventive care refers to lifestyle choices and visits with your health care provider that can promote health and wellness. What does preventive care include? A yearly physical exam. This is also called an annual well check. Dental exams once or twice a year. Routine eye exams. Ask your health care provider how often you should have your eyes checked. Personal lifestyle choices, including: Daily care of your teeth and gums. Regular physical activity. Eating a healthy diet. Avoiding tobacco and drug use. Limiting alcohol use. Practicing safe sex. Taking low doses of aspirin  every day. Taking vitamin and mineral supplements as recommended by your health care provider. What happens during an annual well check? The services and screenings done by your health care provider during your annual well check will depend on your age, overall health, lifestyle risk factors, and family history of disease. Counseling  Your health care provider may ask you questions about your: Alcohol use. Tobacco use. Drug use. Emotional well-being. Home and relationship well-being. Sexual activity. Eating habits. History of falls. Memory and ability to understand (cognition). Work and work Astronomer. Screening  You may have the following tests or measurements: Height, weight, and  BMI. Blood pressure. Lipid and cholesterol levels. These may be checked every 5 years, or more frequently if you are over 63 years old. Skin check. Lung cancer screening. You may have this screening every year starting at age 80 if you have a 30-pack-year history of smoking and currently smoke or have quit within the past 15 years. Fecal occult blood test (FOBT) of the stool. You may have this test every year starting at age 56. Flexible sigmoidoscopy or colonoscopy. You may have a sigmoidoscopy every 5 years or a colonoscopy every 10 years starting at age 54. Prostate cancer screening. Recommendations will vary depending on your family history and other risks. Hepatitis C blood test. Hepatitis B blood test. Sexually transmitted disease (STD) testing. Diabetes screening. This is done by checking your blood sugar (glucose) after you have not eaten for a while (fasting). You may have this done every 1-3 years. Abdominal aortic aneurysm (AAA) screening. You may need this if you are a current or former smoker. Osteoporosis. You may be screened starting at age 83 if you are at high risk. Talk with your health care provider about your test results, treatment options, and if necessary, the need for more tests. Vaccines  Your health care provider may recommend certain vaccines, such as: Influenza vaccine. This is recommended every year. Tetanus, diphtheria, and acellular pertussis (Tdap, Td) vaccine. You may need a Td booster every 10 years. Zoster vaccine. You may need this after age 82. Pneumococcal 13-valent conjugate (PCV13) vaccine. One dose is recommended after age 41. Pneumococcal polysaccharide (PPSV23) vaccine. One dose is recommended after age 75. Talk to your health care provider about which screenings and vaccines you need and how often you need them. This information is not intended  to replace advice given to you by your health care provider. Make sure you discuss any questions you have  with your health care provider. Document Released: 08/08/2015 Document Revised: 03/31/2016 Document Reviewed: 05/13/2015 Elsevier Interactive Patient Education  2017 [employer].  Fall Prevention in the Home Falls can cause injuries. They can happen to people of all ages. There are many things you can do to make your home safe and to help prevent falls. What can I do on the outside of my home? Regularly fix the edges of walkways and driveways and fix any cracks. Remove anything that might make you trip as you walk through a door, such as a raised step or threshold. Trim any bushes or trees on the path to your home. Use bright outdoor lighting. Clear any walking paths of anything that might make someone trip, such as rocks or tools. Regularly check to see if handrails are loose or broken. Make sure that both sides of any steps have handrails. Any raised decks and porches should have guardrails on the edges. Have any leaves, snow, or ice cleared regularly. Use sand or salt on walking paths during winter. Clean up any spills in your garage right away. This includes oil or grease spills. What can I do in the bathroom? Use night lights. Install grab bars by the toilet and in the tub and shower. Do not use towel bars as grab bars. Use non-skid mats or decals in the tub or shower. If you need to sit down in the shower, use a plastic, non-slip stool. Keep the floor dry. Clean up any water that spills on the floor as soon as it happens. Remove soap buildup in the tub or shower regularly. Attach bath mats securely with double-sided non-slip rug tape. Do not have throw rugs and other things on the floor that can make you trip. What can I do in the bedroom? Use night lights. Make sure that you have a light by your bed that is easy to reach. Do not use any sheets or blankets that are too big for your bed. They should not hang down onto the floor. Have a firm chair that has side arms. You can use  this for support while you get dressed. Do not have throw rugs and other things on the floor that can make you trip. What can I do in the kitchen? Clean up any spills right away. Avoid walking on wet floors. Keep items that you use a lot in easy-to-reach places. If you need to reach something above you, use a strong step stool that has a grab bar. Keep electrical cords out of the way. Do not use floor polish or wax that makes floors slippery. If you must use wax, use non-skid floor wax. Do not have throw rugs and other things on the floor that can make you trip. What can I do with my stairs? Do not leave any items on the stairs. Make sure that there are handrails on both sides of the stairs and use them. Fix handrails that are broken or loose. Make sure that handrails are as long as the stairways. Check any carpeting to make sure that it is firmly attached to the stairs. Fix any carpet that is loose or worn. Avoid having throw rugs at the top or bottom of the stairs. If you do have throw rugs, attach them to the floor with carpet tape. Make sure that you have a light switch at the top of the stairs and  the bottom of the stairs. If you do not have them, ask someone to add them for you. What else can I do to help prevent falls? Wear shoes that: Do not have high heels. Have rubber bottoms. Are comfortable and fit you well. Are closed at the toe. Do not wear sandals. If you use a stepladder: Make sure that it is fully opened. Do not climb a closed stepladder. Make sure that both sides of the stepladder are locked into place. Ask someone to hold it for you, if possible. Clearly mark and make sure that you can see: Any grab bars or handrails. First and last steps. Where the edge of each step is. Use tools that help you move around (mobility aids) if they are needed. These include: Canes. Walkers. Scooters. Crutches. Turn on the lights when you go into a dark area. Replace any light bulbs  as soon as they burn out. Set up your furniture so you have a clear path. Avoid moving your furniture around. If any of your floors are uneven, fix them. If there are any pets around you, be aware of where they are. Review your medicines with your doctor. Some medicines can make you feel dizzy. This can increase your chance of falling. Ask your doctor what other things that you can do to help prevent falls. This information is not intended to replace advice given to you by your health care provider. Make sure you discuss any questions you have with your health care provider. Document Released: 05/08/2009 Document Revised: 12/18/2015 Document Reviewed: 08/16/2014 Elsevier Interactive Patient Education  2017 [employer].

## 2023-12-30 ENCOUNTER — Encounter (HOSPITAL_BASED_OUTPATIENT_CLINIC_OR_DEPARTMENT_OTHER): Attending: General Surgery | Admitting: General Surgery

## 2023-12-30 DIAGNOSIS — L97312 Non-pressure chronic ulcer of right ankle with fat layer exposed: Secondary | ICD-10-CM | POA: Insufficient documentation

## 2023-12-30 DIAGNOSIS — I872 Venous insufficiency (chronic) (peripheral): Secondary | ICD-10-CM | POA: Diagnosis not present

## 2023-12-30 DIAGNOSIS — L97812 Non-pressure chronic ulcer of other part of right lower leg with fat layer exposed: Secondary | ICD-10-CM | POA: Insufficient documentation

## 2023-12-30 DIAGNOSIS — L942 Calcinosis cutis: Secondary | ICD-10-CM | POA: Diagnosis not present

## 2023-12-30 DIAGNOSIS — I890 Lymphedema, not elsewhere classified: Secondary | ICD-10-CM | POA: Diagnosis not present

## 2023-12-30 DIAGNOSIS — I2510 Atherosclerotic heart disease of native coronary artery without angina pectoris: Secondary | ICD-10-CM | POA: Diagnosis not present

## 2023-12-30 DIAGNOSIS — L97512 Non-pressure chronic ulcer of other part of right foot with fat layer exposed: Secondary | ICD-10-CM | POA: Insufficient documentation

## 2023-12-30 DIAGNOSIS — Z86718 Personal history of other venous thrombosis and embolism: Secondary | ICD-10-CM | POA: Insufficient documentation

## 2023-12-30 DIAGNOSIS — I10 Essential (primary) hypertension: Secondary | ICD-10-CM | POA: Insufficient documentation

## 2024-01-06 ENCOUNTER — Encounter (HOSPITAL_BASED_OUTPATIENT_CLINIC_OR_DEPARTMENT_OTHER): Admitting: General Surgery

## 2024-01-06 DIAGNOSIS — L97812 Non-pressure chronic ulcer of other part of right lower leg with fat layer exposed: Secondary | ICD-10-CM | POA: Diagnosis not present

## 2024-01-06 DIAGNOSIS — I890 Lymphedema, not elsewhere classified: Secondary | ICD-10-CM | POA: Diagnosis not present

## 2024-01-06 DIAGNOSIS — I10 Essential (primary) hypertension: Secondary | ICD-10-CM | POA: Diagnosis not present

## 2024-01-06 DIAGNOSIS — I2510 Atherosclerotic heart disease of native coronary artery without angina pectoris: Secondary | ICD-10-CM | POA: Diagnosis not present

## 2024-01-06 DIAGNOSIS — L97512 Non-pressure chronic ulcer of other part of right foot with fat layer exposed: Secondary | ICD-10-CM | POA: Diagnosis not present

## 2024-01-06 DIAGNOSIS — Z86718 Personal history of other venous thrombosis and embolism: Secondary | ICD-10-CM | POA: Diagnosis not present

## 2024-01-06 DIAGNOSIS — I872 Venous insufficiency (chronic) (peripheral): Secondary | ICD-10-CM | POA: Diagnosis not present

## 2024-01-06 DIAGNOSIS — L942 Calcinosis cutis: Secondary | ICD-10-CM | POA: Diagnosis not present

## 2024-01-06 DIAGNOSIS — L97312 Non-pressure chronic ulcer of right ankle with fat layer exposed: Secondary | ICD-10-CM | POA: Diagnosis not present

## 2024-01-13 ENCOUNTER — Encounter (HOSPITAL_BASED_OUTPATIENT_CLINIC_OR_DEPARTMENT_OTHER): Admitting: General Surgery

## 2024-01-13 DIAGNOSIS — L97312 Non-pressure chronic ulcer of right ankle with fat layer exposed: Secondary | ICD-10-CM | POA: Diagnosis not present

## 2024-01-13 DIAGNOSIS — I2510 Atherosclerotic heart disease of native coronary artery without angina pectoris: Secondary | ICD-10-CM | POA: Diagnosis not present

## 2024-01-13 DIAGNOSIS — I890 Lymphedema, not elsewhere classified: Secondary | ICD-10-CM | POA: Diagnosis not present

## 2024-01-13 DIAGNOSIS — L97812 Non-pressure chronic ulcer of other part of right lower leg with fat layer exposed: Secondary | ICD-10-CM | POA: Diagnosis not present

## 2024-01-13 DIAGNOSIS — L97512 Non-pressure chronic ulcer of other part of right foot with fat layer exposed: Secondary | ICD-10-CM | POA: Diagnosis not present

## 2024-01-13 DIAGNOSIS — L942 Calcinosis cutis: Secondary | ICD-10-CM | POA: Diagnosis not present

## 2024-01-17 ENCOUNTER — Other Ambulatory Visit: Payer: Self-pay | Admitting: Family Medicine

## 2024-01-20 ENCOUNTER — Encounter (HOSPITAL_BASED_OUTPATIENT_CLINIC_OR_DEPARTMENT_OTHER): Admitting: General Surgery

## 2024-01-20 DIAGNOSIS — L942 Calcinosis cutis: Secondary | ICD-10-CM | POA: Diagnosis not present

## 2024-01-20 DIAGNOSIS — L97512 Non-pressure chronic ulcer of other part of right foot with fat layer exposed: Secondary | ICD-10-CM | POA: Diagnosis not present

## 2024-01-20 DIAGNOSIS — I2510 Atherosclerotic heart disease of native coronary artery without angina pectoris: Secondary | ICD-10-CM | POA: Diagnosis not present

## 2024-01-20 DIAGNOSIS — I890 Lymphedema, not elsewhere classified: Secondary | ICD-10-CM | POA: Diagnosis not present

## 2024-01-20 DIAGNOSIS — L97312 Non-pressure chronic ulcer of right ankle with fat layer exposed: Secondary | ICD-10-CM | POA: Diagnosis not present

## 2024-01-20 DIAGNOSIS — L97812 Non-pressure chronic ulcer of other part of right lower leg with fat layer exposed: Secondary | ICD-10-CM | POA: Diagnosis not present

## 2024-01-23 ENCOUNTER — Other Ambulatory Visit: Payer: Self-pay | Admitting: Internal Medicine

## 2024-01-23 ENCOUNTER — Other Ambulatory Visit (HOSPITAL_BASED_OUTPATIENT_CLINIC_OR_DEPARTMENT_OTHER): Payer: Self-pay

## 2024-01-24 ENCOUNTER — Other Ambulatory Visit (HOSPITAL_BASED_OUTPATIENT_CLINIC_OR_DEPARTMENT_OTHER): Payer: Self-pay

## 2024-01-24 MED ORDER — EZETIMIBE 10 MG PO TABS
10.0000 mg | ORAL_TABLET | Freq: Every day | ORAL | 0 refills | Status: DC
  Filled 2024-01-24: qty 30, 30d supply, fill #0

## 2024-01-26 ENCOUNTER — Encounter (HOSPITAL_BASED_OUTPATIENT_CLINIC_OR_DEPARTMENT_OTHER): Attending: General Surgery | Admitting: General Surgery

## 2024-01-26 DIAGNOSIS — I10 Essential (primary) hypertension: Secondary | ICD-10-CM | POA: Diagnosis not present

## 2024-01-26 DIAGNOSIS — I872 Venous insufficiency (chronic) (peripheral): Secondary | ICD-10-CM | POA: Diagnosis not present

## 2024-01-26 DIAGNOSIS — L942 Calcinosis cutis: Secondary | ICD-10-CM | POA: Insufficient documentation

## 2024-01-26 DIAGNOSIS — L97812 Non-pressure chronic ulcer of other part of right lower leg with fat layer exposed: Secondary | ICD-10-CM | POA: Diagnosis not present

## 2024-01-26 DIAGNOSIS — Z86718 Personal history of other venous thrombosis and embolism: Secondary | ICD-10-CM | POA: Insufficient documentation

## 2024-01-26 DIAGNOSIS — L97512 Non-pressure chronic ulcer of other part of right foot with fat layer exposed: Secondary | ICD-10-CM | POA: Insufficient documentation

## 2024-01-26 DIAGNOSIS — I890 Lymphedema, not elsewhere classified: Secondary | ICD-10-CM | POA: Diagnosis not present

## 2024-01-26 DIAGNOSIS — L97312 Non-pressure chronic ulcer of right ankle with fat layer exposed: Secondary | ICD-10-CM | POA: Insufficient documentation

## 2024-01-26 DIAGNOSIS — I2510 Atherosclerotic heart disease of native coronary artery without angina pectoris: Secondary | ICD-10-CM | POA: Diagnosis not present

## 2024-01-29 ENCOUNTER — Encounter: Payer: Self-pay | Admitting: Internal Medicine

## 2024-01-30 ENCOUNTER — Other Ambulatory Visit: Payer: Self-pay

## 2024-01-30 ENCOUNTER — Other Ambulatory Visit (HOSPITAL_BASED_OUTPATIENT_CLINIC_OR_DEPARTMENT_OTHER): Payer: Self-pay

## 2024-01-30 MED ORDER — EZETIMIBE 10 MG PO TABS
10.0000 mg | ORAL_TABLET | Freq: Every day | ORAL | 0 refills | Status: DC
  Filled 2024-01-30 – 2024-02-17 (×2): qty 90, 90d supply, fill #0

## 2024-01-30 MED ORDER — FUROSEMIDE 40 MG PO TABS
40.0000 mg | ORAL_TABLET | ORAL | 0 refills | Status: DC
  Filled 2024-01-30: qty 45, 90d supply, fill #0

## 2024-02-03 ENCOUNTER — Encounter (HOSPITAL_BASED_OUTPATIENT_CLINIC_OR_DEPARTMENT_OTHER): Admitting: General Surgery

## 2024-02-03 ENCOUNTER — Other Ambulatory Visit: Payer: Self-pay

## 2024-02-03 ENCOUNTER — Other Ambulatory Visit (HOSPITAL_BASED_OUTPATIENT_CLINIC_OR_DEPARTMENT_OTHER): Payer: Self-pay

## 2024-02-03 DIAGNOSIS — L942 Calcinosis cutis: Secondary | ICD-10-CM | POA: Diagnosis not present

## 2024-02-03 DIAGNOSIS — L97812 Non-pressure chronic ulcer of other part of right lower leg with fat layer exposed: Secondary | ICD-10-CM | POA: Diagnosis not present

## 2024-02-03 DIAGNOSIS — I2510 Atherosclerotic heart disease of native coronary artery without angina pectoris: Secondary | ICD-10-CM | POA: Diagnosis not present

## 2024-02-03 DIAGNOSIS — I890 Lymphedema, not elsewhere classified: Secondary | ICD-10-CM | POA: Diagnosis not present

## 2024-02-03 DIAGNOSIS — L97512 Non-pressure chronic ulcer of other part of right foot with fat layer exposed: Secondary | ICD-10-CM | POA: Diagnosis not present

## 2024-02-03 DIAGNOSIS — L97312 Non-pressure chronic ulcer of right ankle with fat layer exposed: Secondary | ICD-10-CM | POA: Diagnosis not present

## 2024-02-06 ENCOUNTER — Other Ambulatory Visit (HOSPITAL_BASED_OUTPATIENT_CLINIC_OR_DEPARTMENT_OTHER): Payer: Self-pay

## 2024-02-09 ENCOUNTER — Encounter: Payer: Self-pay | Admitting: Family Medicine

## 2024-02-10 ENCOUNTER — Encounter: Payer: Self-pay | Admitting: Internal Medicine

## 2024-02-10 ENCOUNTER — Encounter (HOSPITAL_BASED_OUTPATIENT_CLINIC_OR_DEPARTMENT_OTHER): Admitting: General Surgery

## 2024-02-10 DIAGNOSIS — L942 Calcinosis cutis: Secondary | ICD-10-CM | POA: Diagnosis not present

## 2024-02-10 DIAGNOSIS — I2510 Atherosclerotic heart disease of native coronary artery without angina pectoris: Secondary | ICD-10-CM | POA: Diagnosis not present

## 2024-02-10 DIAGNOSIS — L97512 Non-pressure chronic ulcer of other part of right foot with fat layer exposed: Secondary | ICD-10-CM | POA: Diagnosis not present

## 2024-02-10 DIAGNOSIS — R600 Localized edema: Secondary | ICD-10-CM

## 2024-02-10 DIAGNOSIS — Z79899 Other long term (current) drug therapy: Secondary | ICD-10-CM

## 2024-02-10 DIAGNOSIS — I890 Lymphedema, not elsewhere classified: Secondary | ICD-10-CM | POA: Diagnosis not present

## 2024-02-10 DIAGNOSIS — L97812 Non-pressure chronic ulcer of other part of right lower leg with fat layer exposed: Secondary | ICD-10-CM | POA: Diagnosis not present

## 2024-02-10 DIAGNOSIS — L97312 Non-pressure chronic ulcer of right ankle with fat layer exposed: Secondary | ICD-10-CM | POA: Diagnosis not present

## 2024-02-13 NOTE — Telephone Encounter (Signed)
 I would recomm he start with 1.5 tablets lasix  for a 3 days then 40 mg lasix  every other day      CHeck BMET and BNP in 1 wk    Keep us  posted on progress of swelling  Should have follow up appt if does not normalize

## 2024-02-14 NOTE — Telephone Encounter (Signed)
Please further assist.

## 2024-02-14 NOTE — Telephone Encounter (Signed)
 Pls ask Sherri to work on this for us  (different ENT provider).

## 2024-02-17 ENCOUNTER — Encounter (HOSPITAL_BASED_OUTPATIENT_CLINIC_OR_DEPARTMENT_OTHER): Admitting: General Surgery

## 2024-02-17 ENCOUNTER — Other Ambulatory Visit (HOSPITAL_BASED_OUTPATIENT_CLINIC_OR_DEPARTMENT_OTHER): Payer: Self-pay

## 2024-02-17 ENCOUNTER — Other Ambulatory Visit: Payer: Self-pay

## 2024-02-17 DIAGNOSIS — L97812 Non-pressure chronic ulcer of other part of right lower leg with fat layer exposed: Secondary | ICD-10-CM | POA: Diagnosis not present

## 2024-02-17 DIAGNOSIS — I890 Lymphedema, not elsewhere classified: Secondary | ICD-10-CM | POA: Diagnosis not present

## 2024-02-17 DIAGNOSIS — L97512 Non-pressure chronic ulcer of other part of right foot with fat layer exposed: Secondary | ICD-10-CM | POA: Diagnosis not present

## 2024-02-17 DIAGNOSIS — I2510 Atherosclerotic heart disease of native coronary artery without angina pectoris: Secondary | ICD-10-CM | POA: Diagnosis not present

## 2024-02-17 DIAGNOSIS — L942 Calcinosis cutis: Secondary | ICD-10-CM | POA: Diagnosis not present

## 2024-02-17 DIAGNOSIS — L97312 Non-pressure chronic ulcer of right ankle with fat layer exposed: Secondary | ICD-10-CM | POA: Diagnosis not present

## 2024-02-20 ENCOUNTER — Other Ambulatory Visit: Payer: Self-pay | Admitting: Internal Medicine

## 2024-02-20 ENCOUNTER — Other Ambulatory Visit (HOSPITAL_BASED_OUTPATIENT_CLINIC_OR_DEPARTMENT_OTHER): Payer: Self-pay

## 2024-02-20 DIAGNOSIS — Z79899 Other long term (current) drug therapy: Secondary | ICD-10-CM | POA: Diagnosis not present

## 2024-02-20 DIAGNOSIS — R600 Localized edema: Secondary | ICD-10-CM | POA: Diagnosis not present

## 2024-02-20 MED ORDER — ROSUVASTATIN CALCIUM 5 MG PO TABS
5.0000 mg | ORAL_TABLET | Freq: Every day | ORAL | 0 refills | Status: DC
  Filled 2024-02-20 – 2024-02-25 (×3): qty 90, 90d supply, fill #0

## 2024-02-21 LAB — BASIC METABOLIC PANEL WITH GFR
BUN/Creatinine Ratio: 29 — ABNORMAL HIGH (ref 10–24)
BUN: 39 mg/dL — ABNORMAL HIGH (ref 8–27)
CO2: 27 mmol/L (ref 20–29)
Calcium: 8.9 mg/dL (ref 8.6–10.2)
Chloride: 100 mmol/L (ref 96–106)
Creatinine, Ser: 1.34 mg/dL — ABNORMAL HIGH (ref 0.76–1.27)
Glucose: 93 mg/dL (ref 70–99)
Potassium: 4.6 mmol/L (ref 3.5–5.2)
Sodium: 140 mmol/L (ref 134–144)
eGFR: 55 mL/min/1.73 — ABNORMAL LOW (ref >59)

## 2024-02-21 LAB — PRO B NATRIURETIC PEPTIDE: NT-Pro BNP: 488 pg/mL — ABNORMAL HIGH (ref 0–486)

## 2024-02-22 ENCOUNTER — Ambulatory Visit: Payer: Self-pay | Admitting: Internal Medicine

## 2024-02-22 DIAGNOSIS — I10 Essential (primary) hypertension: Secondary | ICD-10-CM

## 2024-02-22 DIAGNOSIS — Z0000 Encounter for general adult medical examination without abnormal findings: Secondary | ICD-10-CM

## 2024-02-23 ENCOUNTER — Other Ambulatory Visit (HOSPITAL_BASED_OUTPATIENT_CLINIC_OR_DEPARTMENT_OTHER): Payer: Self-pay

## 2024-02-23 NOTE — Telephone Encounter (Signed)
 Spoke with the patient and let him know of Dr. Nada response to his blood work. I let him know that he should keep taking Lasix  every other day as prescribed and that we would do a repeat BMET in 3 weeks. Patient agreed and said he will let us  know if anything changes.

## 2024-02-24 ENCOUNTER — Encounter (HOSPITAL_BASED_OUTPATIENT_CLINIC_OR_DEPARTMENT_OTHER): Attending: General Surgery | Admitting: General Surgery

## 2024-02-24 DIAGNOSIS — L97312 Non-pressure chronic ulcer of right ankle with fat layer exposed: Secondary | ICD-10-CM | POA: Diagnosis not present

## 2024-02-24 DIAGNOSIS — L942 Calcinosis cutis: Secondary | ICD-10-CM | POA: Diagnosis not present

## 2024-02-24 DIAGNOSIS — I872 Venous insufficiency (chronic) (peripheral): Secondary | ICD-10-CM | POA: Insufficient documentation

## 2024-02-24 DIAGNOSIS — Z86718 Personal history of other venous thrombosis and embolism: Secondary | ICD-10-CM | POA: Insufficient documentation

## 2024-02-24 DIAGNOSIS — L97812 Non-pressure chronic ulcer of other part of right lower leg with fat layer exposed: Secondary | ICD-10-CM | POA: Diagnosis not present

## 2024-02-24 DIAGNOSIS — I10 Essential (primary) hypertension: Secondary | ICD-10-CM | POA: Insufficient documentation

## 2024-02-24 DIAGNOSIS — I890 Lymphedema, not elsewhere classified: Secondary | ICD-10-CM | POA: Insufficient documentation

## 2024-02-24 DIAGNOSIS — L97522 Non-pressure chronic ulcer of other part of left foot with fat layer exposed: Secondary | ICD-10-CM | POA: Insufficient documentation

## 2024-02-24 DIAGNOSIS — L97512 Non-pressure chronic ulcer of other part of right foot with fat layer exposed: Secondary | ICD-10-CM | POA: Insufficient documentation

## 2024-02-24 DIAGNOSIS — I2510 Atherosclerotic heart disease of native coronary artery without angina pectoris: Secondary | ICD-10-CM | POA: Insufficient documentation

## 2024-02-24 DIAGNOSIS — B9561 Methicillin susceptible Staphylococcus aureus infection as the cause of diseases classified elsewhere: Secondary | ICD-10-CM | POA: Insufficient documentation

## 2024-02-25 ENCOUNTER — Other Ambulatory Visit (HOSPITAL_BASED_OUTPATIENT_CLINIC_OR_DEPARTMENT_OTHER): Payer: Self-pay

## 2024-03-02 ENCOUNTER — Encounter (HOSPITAL_BASED_OUTPATIENT_CLINIC_OR_DEPARTMENT_OTHER): Admitting: General Surgery

## 2024-03-02 DIAGNOSIS — B9561 Methicillin susceptible Staphylococcus aureus infection as the cause of diseases classified elsewhere: Secondary | ICD-10-CM | POA: Diagnosis not present

## 2024-03-02 DIAGNOSIS — L97812 Non-pressure chronic ulcer of other part of right lower leg with fat layer exposed: Secondary | ICD-10-CM | POA: Diagnosis not present

## 2024-03-02 DIAGNOSIS — L97512 Non-pressure chronic ulcer of other part of right foot with fat layer exposed: Secondary | ICD-10-CM | POA: Diagnosis not present

## 2024-03-02 DIAGNOSIS — L97312 Non-pressure chronic ulcer of right ankle with fat layer exposed: Secondary | ICD-10-CM | POA: Diagnosis not present

## 2024-03-02 DIAGNOSIS — L942 Calcinosis cutis: Secondary | ICD-10-CM | POA: Diagnosis not present

## 2024-03-02 DIAGNOSIS — I890 Lymphedema, not elsewhere classified: Secondary | ICD-10-CM | POA: Diagnosis not present

## 2024-03-02 DIAGNOSIS — L97522 Non-pressure chronic ulcer of other part of left foot with fat layer exposed: Secondary | ICD-10-CM | POA: Diagnosis not present

## 2024-03-03 ENCOUNTER — Other Ambulatory Visit (HOSPITAL_BASED_OUTPATIENT_CLINIC_OR_DEPARTMENT_OTHER): Payer: Self-pay

## 2024-03-09 ENCOUNTER — Encounter (HOSPITAL_BASED_OUTPATIENT_CLINIC_OR_DEPARTMENT_OTHER): Admitting: General Surgery

## 2024-03-09 DIAGNOSIS — L97512 Non-pressure chronic ulcer of other part of right foot with fat layer exposed: Secondary | ICD-10-CM | POA: Diagnosis not present

## 2024-03-09 DIAGNOSIS — L97522 Non-pressure chronic ulcer of other part of left foot with fat layer exposed: Secondary | ICD-10-CM | POA: Diagnosis not present

## 2024-03-09 DIAGNOSIS — I890 Lymphedema, not elsewhere classified: Secondary | ICD-10-CM | POA: Diagnosis not present

## 2024-03-09 DIAGNOSIS — B9561 Methicillin susceptible Staphylococcus aureus infection as the cause of diseases classified elsewhere: Secondary | ICD-10-CM | POA: Diagnosis not present

## 2024-03-09 DIAGNOSIS — L97312 Non-pressure chronic ulcer of right ankle with fat layer exposed: Secondary | ICD-10-CM | POA: Diagnosis not present

## 2024-03-09 DIAGNOSIS — L942 Calcinosis cutis: Secondary | ICD-10-CM | POA: Diagnosis not present

## 2024-03-09 DIAGNOSIS — L97812 Non-pressure chronic ulcer of other part of right lower leg with fat layer exposed: Secondary | ICD-10-CM | POA: Diagnosis not present

## 2024-03-12 ENCOUNTER — Other Ambulatory Visit: Payer: Self-pay | Admitting: Internal Medicine

## 2024-03-16 ENCOUNTER — Encounter (HOSPITAL_BASED_OUTPATIENT_CLINIC_OR_DEPARTMENT_OTHER): Admitting: General Surgery

## 2024-03-16 DIAGNOSIS — L97812 Non-pressure chronic ulcer of other part of right lower leg with fat layer exposed: Secondary | ICD-10-CM | POA: Diagnosis not present

## 2024-03-16 DIAGNOSIS — B9561 Methicillin susceptible Staphylococcus aureus infection as the cause of diseases classified elsewhere: Secondary | ICD-10-CM | POA: Diagnosis not present

## 2024-03-16 DIAGNOSIS — I890 Lymphedema, not elsewhere classified: Secondary | ICD-10-CM | POA: Diagnosis not present

## 2024-03-16 DIAGNOSIS — L97522 Non-pressure chronic ulcer of other part of left foot with fat layer exposed: Secondary | ICD-10-CM | POA: Diagnosis not present

## 2024-03-16 DIAGNOSIS — L942 Calcinosis cutis: Secondary | ICD-10-CM | POA: Diagnosis not present

## 2024-03-16 DIAGNOSIS — L97312 Non-pressure chronic ulcer of right ankle with fat layer exposed: Secondary | ICD-10-CM | POA: Diagnosis not present

## 2024-03-16 DIAGNOSIS — L97512 Non-pressure chronic ulcer of other part of right foot with fat layer exposed: Secondary | ICD-10-CM | POA: Diagnosis not present

## 2024-03-19 ENCOUNTER — Encounter: Payer: Self-pay | Admitting: Internal Medicine

## 2024-03-19 ENCOUNTER — Other Ambulatory Visit (HOSPITAL_BASED_OUTPATIENT_CLINIC_OR_DEPARTMENT_OTHER): Payer: Self-pay

## 2024-03-19 DIAGNOSIS — I10 Essential (primary) hypertension: Secondary | ICD-10-CM

## 2024-03-19 DIAGNOSIS — Z0000 Encounter for general adult medical examination without abnormal findings: Secondary | ICD-10-CM

## 2024-03-19 NOTE — Telephone Encounter (Signed)
 Called and spoke to pt. He will see Orren Fabry, PA on 03/29/24. No improvement in BLE swelling. He plans on having repeat BMET drawn on 03/23/24. He is on Lasix  40 mg every other day.

## 2024-03-23 ENCOUNTER — Encounter (HOSPITAL_BASED_OUTPATIENT_CLINIC_OR_DEPARTMENT_OTHER): Admitting: General Surgery

## 2024-03-23 DIAGNOSIS — Z0000 Encounter for general adult medical examination without abnormal findings: Secondary | ICD-10-CM | POA: Diagnosis not present

## 2024-03-23 DIAGNOSIS — L942 Calcinosis cutis: Secondary | ICD-10-CM | POA: Diagnosis not present

## 2024-03-23 DIAGNOSIS — L97812 Non-pressure chronic ulcer of other part of right lower leg with fat layer exposed: Secondary | ICD-10-CM | POA: Diagnosis not present

## 2024-03-23 DIAGNOSIS — B9561 Methicillin susceptible Staphylococcus aureus infection as the cause of diseases classified elsewhere: Secondary | ICD-10-CM | POA: Diagnosis not present

## 2024-03-23 DIAGNOSIS — L97312 Non-pressure chronic ulcer of right ankle with fat layer exposed: Secondary | ICD-10-CM | POA: Diagnosis not present

## 2024-03-23 DIAGNOSIS — I10 Essential (primary) hypertension: Secondary | ICD-10-CM | POA: Diagnosis not present

## 2024-03-23 DIAGNOSIS — L97522 Non-pressure chronic ulcer of other part of left foot with fat layer exposed: Secondary | ICD-10-CM | POA: Diagnosis not present

## 2024-03-23 DIAGNOSIS — L97512 Non-pressure chronic ulcer of other part of right foot with fat layer exposed: Secondary | ICD-10-CM | POA: Diagnosis not present

## 2024-03-24 ENCOUNTER — Ambulatory Visit: Payer: Self-pay | Admitting: Internal Medicine

## 2024-03-24 LAB — BASIC METABOLIC PANEL WITH GFR
BUN/Creatinine Ratio: 19 (ref 10–24)
BUN: 16 mg/dL (ref 8–27)
CO2: 27 mmol/L (ref 20–29)
Calcium: 9 mg/dL (ref 8.6–10.2)
Chloride: 100 mmol/L (ref 96–106)
Creatinine, Ser: 0.86 mg/dL (ref 0.76–1.27)
Glucose: 86 mg/dL (ref 70–99)
Potassium: 4.6 mmol/L (ref 3.5–5.2)
Sodium: 142 mmol/L (ref 134–144)
eGFR: 90 mL/min/1.73 (ref >59)

## 2024-03-27 ENCOUNTER — Ambulatory Visit: Admitting: Family Medicine

## 2024-03-28 NOTE — Progress Notes (Unsigned)
 Office Visit    Patient Name: [name] Date of Encounter: 03/29/2024  PCP:  Patient, No Pcp Per   [facility]  Cardiologist:  Vina Gull, MD  Advanced Practice Provider:  No care team member to display Electrophysiologist:  None    Chief Complaint    [name] is a 73 y.o. adult with a hx of CAD (last cardiac catheterization November 2015 showed a widely patent previously placed bare-metal stent in the proximal LAD (1998) without restenosis and patient underwent PCI to subtotal occlusion of RCA with DES stent placement), hypertension, hyperlipidemia, RBBB presents today for overdue follow-up appointment.  He was last seen via video visit with Dr. Gull 01/22/2021.  He was not feeling well during this visit.  He had fevers, chills, but no cough.  He took a COVID test x2 both were negative.  He denied chest pain, but noticed some increased SOB.  Also, he notes some increased lower extremity edema.  He had an upcoming orthopedic surgery.  Labs were ordered.  I saw him 01/2022, he shares that he has finally moved in to his daughter's house.  She has completed the renovations.  He was living in [place]  before this.  He states that he is not doing well today.  He is having increased swelling in both legs as well as some open wounds.  He has had swelling of his right leg since he was in his 30s and his left leg is actually less swollen than it has been since he had a DVT.  He is on Eliquis  currently and would like to come off of it due to the cost.  It was noted in previous notes that a hypercoagulable panel was to be ordered to further evaluate the cause of the DVT.  We will order this today.  The patient also requests a consult for wound care for his open ulcers on his legs.  He has some venous insufficiency with ulceration that needs to be addressed.  We will also reach out to vascular surgery for consult.  He has his legs wrapped today.  There is some erythema present and  possibly could be infected.  Not sure what his legs look like at baseline.  Blood pressure is well controlled today 130/70.  We reviewed his EKG with a stable right bundle branch block, normal sinus rhythm with occasional PVC.  Reports no chest pain, pressure, or tightness. No orthopnea, PND. Reports no palpitations.    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  He has experienced progressive leg swelling over the past two months, significantly affecting his mobility. He uses a lymphedema pump, initially tolerating it for ten minutes due to 'pins and needles' sensations, but now uses it for thirty minutes once daily, aiming for twice daily. He takes Lasix  40 mg every other day, occasionally doubling the dose, but finds it ineffective in reducing swelling or increasing urination.  He has a history of a blood clot in his leg and takes Eliquis . His blood pressure sometimes reaches 150/90, and he takes his medications at night. He has noticed weight gain over the past few years, which he attributes to fluid retention.  He experiences insomnia, waking at 2 AM after going to bed at 11 PM, and remains seated for hours, which he believes contributes to his swelling. He has been taking Eliquis  since a stent placement in 2016.   Reports no chest pain, pressure, or  tightness. No orthopnea, PND. Reports no palpitations.   Discussed the use of AI scribe software for clinical note transcription with the patient, who gave verbal consent to proceed.  Past Medical History    Past Medical History:  Diagnosis Date   Adenomatous colon polyp - diminutive 06/2019   no recall needed   Bilateral lower extremity edema    Coronary artery disease    a. s/p BMS to LAD (1998) b. s/p DES to RCA (06/24/14)   DVT (deep venous thrombosis) (HCC)    12/2020 L femoral and poplitea.  Unprovoked.  hypercoag w/u NEG.   Eliquis .   Dyspnea    Fungal infection of lung     hx valley fever 2000   Hay fever    Hemidiaphragm paralysis    right   HTN (hypertension)    Hyperlipidemia    Inclusion body myositis    Dx approx 2018, hx chronic CK elevation ( 415 CK, ALL MM CK subtype, seen by Rheumatology and considered normal Variant). ?inclusion body myositis? Power WC as of 2022   Macrocytic anemia    Estab with cone hem/onc 08/2022   Osteoarthritis, multiple sites    Palatal fistula    Palpable abd. aorta    Aortic u/s NO ANEURISM 2020   RBBB    Spinal stenosis    Dr Royal Bury  surgery   Thrombocytopenia [facility])    Bone marrow biopsy benign 2025   Past Surgical History:  Procedure Laterality Date   APPENDECTOMY  2010   BONE MARROW BIOPSY     2025 benign   CERVICAL FUSION  09/2009   CHOLECYSTECTOMY  1998   COLONOSCOPY     2020-->no recall   EYE SURGERY     Lasik   HYDROCELE EXCISION  2005   LEFT HEART CATHETERIZATION WITH CORONARY ANGIOGRAM N/A 06/24/2014   Procedure: LEFT HEART CATHETERIZATION WITH CORONARY ANGIOGRAM;  Surgeon: Debby DELENA Sor, MD;  Location: [facility] CATH LAB;  Service: Cardiovascular;  Laterality: N/A;   LUMBAR SPINE SURGERY  11/2011   MUSCLE BIOPSY Left 07/11/2018   Procedure: Left Vastus lateralus muscle biopsy;  Surgeon: Cheryle Debby DELENA, MD;  Location: [facility] OR;  Service: Neurosurgery;  Laterality: Left;  Left Vastus lateralus muscle biopsy   PERCUTANEOUS CORONARY STENT INTERVENTION (PCI-S)  06/24/2014   DES to RCA. Procedure: PERCUTANEOUS CORONARY STENT INTERVENTION (PCI-S);  Surgeon: Debby DELENA Sor, MD;  Location: [facility] CATH LAB;  Service: Cardiovascular;;  Distal RCA   Rhythm monitoring     04/2023, occ pvc's   TONSILLECTOMY AND ADENOIDECTOMY  2012   TOTAL HIP ARTHROPLASTY Right 2021   RIGHT   TRANSTHORACIC ECHOCARDIOGRAM     03/2019 normal.  Nov 2024 mild RV dysf, o/w normal    Allergies  No Known Allergies  EKGs/Labs/Other Studies Reviewed:   The following studies were reviewed today:  04/13/2019  echocardiogram IMPRESSIONS     1. Left ventricular ejection fraction, by visual estimation, is 60 to  65%. The left ventricle has normal function. Normal left ventricular size.  Left ventricular septal wall thickness was moderately increased. There is  no left ventricular hypertrophy.   2. Global right ventricle has normal systolic function.The right  ventricular size is normal. No increase in right ventricular wall  thickness.   3. Left atrial size was moderately dilated.   4. Right atrial size was normal.   5. Moderate thickening of the mitral valve leaflet(s).   6. The mitral valve is normal in structure. Mild mitral  valve  regurgitation. No evidence of mitral stenosis.   7. The tricuspid valve is normal in structure. Tricuspid valve  regurgitation is mild.   8. The aortic valve is normal in structure. Aortic valve regurgitation is  mild by color flow Doppler. Mild aortic valve sclerosis without stenosis.   9. The pulmonic valve was grossly normal. Pulmonic valve regurgitation is  trivial by color flow Doppler.  10. The inferior vena cava is normal in size with greater than 50%  respiratory variability, suggesting right atrial pressure of 3 mmHg.   EKG:  EKG is ordered today.  The ekg ordered today demonstrates normal sinus rhythm with PVCs, RBBB (chronic)  Recent Labs: 05/05/2023: TSH 1.600 11/10/2023: ALT 14; Hemoglobin 12.3; Platelet Count 60 12/12/2023: Magnesium 2.1 02/20/2024: NT-Pro BNP 488 03/23/2024: BUN 16; Creatinine, Ser 0.86; Potassium 4.6; Sodium 142  Recent Lipid Panel    Component Value Date/Time   CHOL 99 12/12/2023 1311   CHOL 116 12/31/2020 0856   TRIG 45.0 12/12/2023 1311   HDL 53.40 12/12/2023 1311   HDL 28 (L) 12/31/2020 0856   CHOLHDL 2 12/12/2023 1311   VLDL 9.0 12/12/2023 1311   LDLCALC 36 12/12/2023 1311   LDLCALC 70 12/31/2020 0856   LDLDIRECT 65 05/16/2019 1400   Home Medications   Current Meds  Medication Sig   acetaminophen  (TYLENOL ) 500  MG tablet Take by mouth as needed for moderate pain or mild pain.   apixaban  (ELIQUIS ) 2.5 MG TABS tablet Take 1 tablet (2.5 mg total) by mouth 2 (two) times daily.   atenolol  (TENORMIN ) 25 MG tablet Take 1 tablet (25 mg total) by mouth 2 (two) times daily.   b complex vitamins tablet Take 1 tablet by mouth daily.   cholecalciferol (VITAMIN D) 1000 units tablet Take 1,000 Units by mouth daily.   clotrimazole-betamethasone (LOTRISONE) cream Apply 1 Application topically as needed.   Coenzyme Q10 (CO Q 10) 100 MG CAPS Take 100 mg by mouth daily.    ezetimibe  (ZETIA ) 10 MG tablet Take 1 tablet (10 mg total) by mouth daily.   folic acid  (FOLVITE ) 800 MCG tablet Take 800 mcg by mouth daily.    glucosamine-chondroitin 500-400 MG tablet Take 2 tablets by mouth daily.    ibuprofen (ADVIL,MOTRIN) 800 MG tablet Take 800 mg by mouth daily.    Krill Oil 500 MG CAPS Take 500 mg by mouth daily.    lisinopril  (ZESTRIL ) 10 MG tablet Take 1 tablet (10 mg total) by mouth daily.   rosuvastatin  (CRESTOR ) 5 MG tablet TAKE 1 TABLET (5 MG TOTAL) BY MOUTH DAILY.   Saw Palmetto, Serenoa repens, (SAW PALMETTO PO) Take 300 mg by mouth daily.   tamsulosin  (FLOMAX ) 0.4 MG CAPS capsule TAKE 1 CAPSULE BY MOUTH EVERY DAY   [DISCONTINUED] furosemide  (LASIX ) 40 MG tablet Take 1 tablet (40 mg total) by mouth every other day.   [DISCONTINUED] potassium chloride  (KLOR-CON ) 10 MEQ tablet Take 1 tablet (10 mEq total) by mouth every other day.     Review of Systems      All other systems reviewed and are otherwise negative except as noted above.  Physical Exam    VS:  BP (!) 150/70   Pulse 76   Ht 5' 10 (1.778 m)   Wt (!) 304 lb (137.9 kg)   SpO2 98%   BMI 43.62 kg/m  , BMI Body mass index is 43.62 kg/m.  Wt Readings from Last 3 Encounters:  03/29/24 (!) 304 lb (137.9 kg)  12/26/23  295 lb 6.4 oz (134 kg)  12/12/23 292 lb 12.8 oz (132.8 kg)     GEN: Well nourished, well developed, in no acute distress. HEENT:  normal. Neck: Supple, no JVD, carotid bruits, or masses. Cardiac: RRR, no murmurs, rubs, or gallops. No clubbing, cyanosis, 2+ pitting edema with erythema and weeping wounds that are wrapped.  Radials/PT 2+ and equal bilaterally.  Respiratory:  Respirations regular and unlabored, clear to auscultation bilaterally. GI: Soft, nontender, nondistended. MS: No deformity or atrophy. Skin: Warm and dry, no rash. Neuro:  Strength and sensation are intact. Psych: Normal affect.  Assessment & Plan    Lymphedema/Venous stasis with ulcer Chronic lymphedema with significant leg swelling due to inclusion body myositis, mild right ventricular dysfunction, and mild mitral valve leak. Inconsistent lymphedema pump use noted. Increasing Lasix  may reduce swelling. - Increase Lasix  to 40 mg twice daily for a few days, then reduce to 40 mg daily for a week, and return to usual dose. - Double potassium supplementation to 20 mEq when taking increased Lasix  dose. - Refer to lymphedema clinic for further management and possible lymphedema massage therapy. - Recheck BMP and BNP in 1 week - Encourage consistent use of lymphedema pump for 30 minutes twice daily. - Encourage daily weight monitoring to assess fluid retention changes.  Hypertension Variable blood pressure with recent systolic up to 150 mmHg. Current regimen includes lisinopril . Increasing Lasix  may aid in lowering blood pressure. - Monitor blood pressure regularly at the wound care clinic. - Consider taking lisinopril  in the morning if daytime blood pressures remain elevated.  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 No cure or specific treatment available.  Deep Vein Thrombosis (DVT) Managed with Eliquis  to prevent further clotting events.  Insomnia Chronic insomnia possibly exacerbated by previous Levaquin  use. Difficulty sleeping through the night and waking early. May contribute to increased swelling due to  prolonged sitting. -refer to PCP for treatment  Hx of CAD -Status post bare-metal stent to the LAD back in 1998 -Status post DES to RCA in 2015 -No chest pain or new SOB -Continue GDMT: Eliquis  5 mg twice daily, Zetia  10 mg daily,  lisinopril  10 mg daily, Crestor  5 mg daily  RBBB -old    Disposition: Follow up 3 months with Vina Gull, MD or APP.  Signed, Orren LOISE Fabry, PA-C 03/29/2024, 3:46 PM [facility]

## 2024-03-29 ENCOUNTER — Other Ambulatory Visit (HOSPITAL_BASED_OUTPATIENT_CLINIC_OR_DEPARTMENT_OTHER): Payer: Self-pay

## 2024-03-29 ENCOUNTER — Encounter: Payer: Self-pay | Admitting: Physician Assistant

## 2024-03-29 ENCOUNTER — Ambulatory Visit: Attending: Physician Assistant | Admitting: Physician Assistant

## 2024-03-29 VITALS — BP 150/70 | HR 76 | Ht 70.0 in | Wt 304.0 lb

## 2024-03-29 DIAGNOSIS — I10 Essential (primary) hypertension: Secondary | ICD-10-CM | POA: Insufficient documentation

## 2024-03-29 DIAGNOSIS — I2510 Atherosclerotic heart disease of native coronary artery without angina pectoris: Secondary | ICD-10-CM | POA: Insufficient documentation

## 2024-03-29 DIAGNOSIS — R600 Localized edema: Secondary | ICD-10-CM | POA: Insufficient documentation

## 2024-03-29 DIAGNOSIS — Z86718 Personal history of other venous thrombosis and embolism: Secondary | ICD-10-CM | POA: Insufficient documentation

## 2024-03-29 DIAGNOSIS — I4510 Unspecified right bundle-branch block: Secondary | ICD-10-CM | POA: Insufficient documentation

## 2024-03-29 DIAGNOSIS — E785 Hyperlipidemia, unspecified: Secondary | ICD-10-CM | POA: Insufficient documentation

## 2024-03-29 DIAGNOSIS — Z0000 Encounter for general adult medical examination without abnormal findings: Secondary | ICD-10-CM | POA: Diagnosis not present

## 2024-03-29 DIAGNOSIS — Z79899 Other long term (current) drug therapy: Secondary | ICD-10-CM | POA: Diagnosis not present

## 2024-03-29 MED ORDER — POTASSIUM CHLORIDE ER 10 MEQ PO TBCR: 10.0000 meq | EXTENDED_RELEASE_TABLET | ORAL | 0 refills | Status: DC

## 2024-03-29 MED ORDER — FUROSEMIDE 40 MG PO TABS
40.0000 mg | ORAL_TABLET | ORAL | 0 refills | Status: DC
  Filled 2024-03-29 – 2024-03-30 (×2): qty 90, 180d supply, fill #0

## 2024-03-29 NOTE — Patient Instructions (Signed)
 Medication Instructions:  Increase Lasix  40 mg take one tablet twice daily for three days, then take one tablet daily for one week, then resume take one tablet every other day  Increase Potassium 10 MEQ take two tablets daily for three days, then take one tablet daily for one week, then resume take one tablet every other day *If you need a refill on your cardiac medications before your next appointment, please call your pharmacy*  Lab Work: BMET, BNP IN ONE WEEK If you have labs (blood work) drawn today and your tests are completely normal, you will receive your results only by: MyChart Message (if you have MyChart) OR A paper copy in the mail If you have any lab test that is abnormal or we need to change your treatment, we will call you to review the results.   Follow-Up: At [facility], you and your health needs are our priority.  As part of our continuing mission to provide you with exceptional heart care, our providers are all part of one team.  This team includes your primary Cardiologist (physician) and Advanced Practice Providers or APPs (Physician Assistants and Nurse Practitioners) who all work together to provide you with the care you need, when you need it.  Your next appointment:   3 month(s)  Provider:   Vina Gull, MD    We recommend signing up for the patient portal called MyChart.  Sign up information is provided on this After Visit Summary.  MyChart is used to connect with patients for Virtual Visits (Telemedicine).  Patients are able to view lab/test results, encounter notes, upcoming appointments, etc.  Non-urgent messages can be sent to your provider as well.   To learn more about what you can do with MyChart, go to [email or web address].   Other Instructions Please check you weights daily

## 2024-03-30 ENCOUNTER — Other Ambulatory Visit (HOSPITAL_BASED_OUTPATIENT_CLINIC_OR_DEPARTMENT_OTHER): Payer: Self-pay

## 2024-03-30 ENCOUNTER — Other Ambulatory Visit: Payer: Self-pay | Admitting: Physician Assistant

## 2024-03-30 ENCOUNTER — Encounter (HOSPITAL_BASED_OUTPATIENT_CLINIC_OR_DEPARTMENT_OTHER): Attending: General Surgery | Admitting: General Surgery

## 2024-03-30 DIAGNOSIS — I890 Lymphedema, not elsewhere classified: Secondary | ICD-10-CM | POA: Insufficient documentation

## 2024-03-30 DIAGNOSIS — L97312 Non-pressure chronic ulcer of right ankle with fat layer exposed: Secondary | ICD-10-CM | POA: Diagnosis not present

## 2024-03-30 DIAGNOSIS — Z86711 Personal history of pulmonary embolism: Secondary | ICD-10-CM | POA: Diagnosis not present

## 2024-03-30 DIAGNOSIS — L942 Calcinosis cutis: Secondary | ICD-10-CM | POA: Diagnosis not present

## 2024-03-30 DIAGNOSIS — L97812 Non-pressure chronic ulcer of other part of right lower leg with fat layer exposed: Secondary | ICD-10-CM | POA: Insufficient documentation

## 2024-03-30 DIAGNOSIS — L97512 Non-pressure chronic ulcer of other part of right foot with fat layer exposed: Secondary | ICD-10-CM | POA: Diagnosis not present

## 2024-03-30 DIAGNOSIS — I10 Essential (primary) hypertension: Secondary | ICD-10-CM | POA: Insufficient documentation

## 2024-03-30 DIAGNOSIS — I872 Venous insufficiency (chronic) (peripheral): Secondary | ICD-10-CM | POA: Diagnosis not present

## 2024-03-30 DIAGNOSIS — I2510 Atherosclerotic heart disease of native coronary artery without angina pectoris: Secondary | ICD-10-CM | POA: Insufficient documentation

## 2024-03-30 DIAGNOSIS — L97522 Non-pressure chronic ulcer of other part of left foot with fat layer exposed: Secondary | ICD-10-CM | POA: Diagnosis not present

## 2024-04-05 ENCOUNTER — Other Ambulatory Visit (HOSPITAL_BASED_OUTPATIENT_CLINIC_OR_DEPARTMENT_OTHER): Payer: Self-pay

## 2024-04-06 ENCOUNTER — Encounter (HOSPITAL_BASED_OUTPATIENT_CLINIC_OR_DEPARTMENT_OTHER): Admitting: General Surgery

## 2024-04-06 DIAGNOSIS — I872 Venous insufficiency (chronic) (peripheral): Secondary | ICD-10-CM | POA: Diagnosis not present

## 2024-04-06 DIAGNOSIS — I2510 Atherosclerotic heart disease of native coronary artery without angina pectoris: Secondary | ICD-10-CM | POA: Diagnosis not present

## 2024-04-06 DIAGNOSIS — L97812 Non-pressure chronic ulcer of other part of right lower leg with fat layer exposed: Secondary | ICD-10-CM | POA: Diagnosis not present

## 2024-04-06 DIAGNOSIS — L942 Calcinosis cutis: Secondary | ICD-10-CM | POA: Diagnosis not present

## 2024-04-06 DIAGNOSIS — I890 Lymphedema, not elsewhere classified: Secondary | ICD-10-CM | POA: Diagnosis not present

## 2024-04-06 DIAGNOSIS — L97512 Non-pressure chronic ulcer of other part of right foot with fat layer exposed: Secondary | ICD-10-CM | POA: Diagnosis not present

## 2024-04-10 ENCOUNTER — Other Ambulatory Visit (HOSPITAL_BASED_OUTPATIENT_CLINIC_OR_DEPARTMENT_OTHER): Payer: Self-pay | Admitting: *Deleted

## 2024-04-10 DIAGNOSIS — E785 Hyperlipidemia, unspecified: Secondary | ICD-10-CM

## 2024-04-10 DIAGNOSIS — R600 Localized edema: Secondary | ICD-10-CM | POA: Diagnosis not present

## 2024-04-10 DIAGNOSIS — Z79899 Other long term (current) drug therapy: Secondary | ICD-10-CM

## 2024-04-10 DIAGNOSIS — I4510 Unspecified right bundle-branch block: Secondary | ICD-10-CM

## 2024-04-10 DIAGNOSIS — I2510 Atherosclerotic heart disease of native coronary artery without angina pectoris: Secondary | ICD-10-CM | POA: Diagnosis not present

## 2024-04-10 DIAGNOSIS — Z86718 Personal history of other venous thrombosis and embolism: Secondary | ICD-10-CM

## 2024-04-10 DIAGNOSIS — I10 Essential (primary) hypertension: Secondary | ICD-10-CM

## 2024-04-10 DIAGNOSIS — Z0000 Encounter for general adult medical examination without abnormal findings: Secondary | ICD-10-CM

## 2024-04-11 LAB — BASIC METABOLIC PANEL WITH GFR
BUN/Creatinine Ratio: 26 — ABNORMAL HIGH (ref 10–24)
BUN: 30 mg/dL — ABNORMAL HIGH (ref 8–27)
CO2: 27 mmol/L (ref 20–29)
Calcium: 8.9 mg/dL (ref 8.6–10.2)
Chloride: 102 mmol/L (ref 96–106)
Creatinine, Ser: 1.16 mg/dL (ref 0.76–1.27)
Glucose: 79 mg/dL (ref 70–99)
Potassium: 5.2 mmol/L (ref 3.5–5.2)
Sodium: 141 mmol/L (ref 134–144)
eGFR: 66 mL/min/1.73 (ref >59)

## 2024-04-11 LAB — PRO B NATRIURETIC PEPTIDE: NT-Pro BNP: 549 pg/mL — ABNORMAL HIGH (ref 0–486)

## 2024-04-12 ENCOUNTER — Ambulatory Visit: Payer: Self-pay | Admitting: Physician Assistant

## 2024-04-13 ENCOUNTER — Other Ambulatory Visit (HOSPITAL_BASED_OUTPATIENT_CLINIC_OR_DEPARTMENT_OTHER): Payer: Self-pay

## 2024-04-13 ENCOUNTER — Other Ambulatory Visit: Payer: Self-pay

## 2024-04-13 ENCOUNTER — Encounter (HOSPITAL_BASED_OUTPATIENT_CLINIC_OR_DEPARTMENT_OTHER): Admitting: General Surgery

## 2024-04-13 DIAGNOSIS — L97812 Non-pressure chronic ulcer of other part of right lower leg with fat layer exposed: Secondary | ICD-10-CM | POA: Diagnosis not present

## 2024-04-13 DIAGNOSIS — L942 Calcinosis cutis: Secondary | ICD-10-CM | POA: Diagnosis not present

## 2024-04-13 DIAGNOSIS — L97512 Non-pressure chronic ulcer of other part of right foot with fat layer exposed: Secondary | ICD-10-CM | POA: Diagnosis not present

## 2024-04-13 DIAGNOSIS — I890 Lymphedema, not elsewhere classified: Secondary | ICD-10-CM | POA: Diagnosis not present

## 2024-04-13 DIAGNOSIS — I2510 Atherosclerotic heart disease of native coronary artery without angina pectoris: Secondary | ICD-10-CM | POA: Diagnosis not present

## 2024-04-13 DIAGNOSIS — I872 Venous insufficiency (chronic) (peripheral): Secondary | ICD-10-CM | POA: Diagnosis not present

## 2024-04-13 MED ORDER — FUROSEMIDE 40 MG PO TABS
ORAL_TABLET | ORAL | 0 refills | Status: DC
  Filled 2024-04-13: qty 45, fill #0
  Filled 2024-04-13: qty 45, 40d supply, fill #0

## 2024-04-16 ENCOUNTER — Other Ambulatory Visit (HOSPITAL_BASED_OUTPATIENT_CLINIC_OR_DEPARTMENT_OTHER): Payer: Self-pay

## 2024-04-16 MED ORDER — POTASSIUM CHLORIDE ER 10 MEQ PO TBCR
10.0000 meq | EXTENDED_RELEASE_TABLET | ORAL | 0 refills | Status: DC
  Filled 2024-04-16: qty 45, 90d supply, fill #0
  Filled 2024-06-18: qty 45, 90d supply, fill #1

## 2024-04-17 ENCOUNTER — Other Ambulatory Visit (HOSPITAL_BASED_OUTPATIENT_CLINIC_OR_DEPARTMENT_OTHER): Payer: Self-pay

## 2024-04-19 ENCOUNTER — Encounter (INDEPENDENT_AMBULATORY_CARE_PROVIDER_SITE_OTHER): Payer: Self-pay | Admitting: Otolaryngology

## 2024-04-19 ENCOUNTER — Ambulatory Visit (INDEPENDENT_AMBULATORY_CARE_PROVIDER_SITE_OTHER): Admitting: Otolaryngology

## 2024-04-19 ENCOUNTER — Other Ambulatory Visit (HOSPITAL_BASED_OUTPATIENT_CLINIC_OR_DEPARTMENT_OTHER): Payer: Self-pay

## 2024-04-19 VITALS — BP 136/63 | HR 77

## 2024-04-19 DIAGNOSIS — J339 Nasal polyp, unspecified: Secondary | ICD-10-CM | POA: Diagnosis not present

## 2024-04-19 DIAGNOSIS — J3089 Other allergic rhinitis: Secondary | ICD-10-CM

## 2024-04-19 DIAGNOSIS — J310 Chronic rhinitis: Secondary | ICD-10-CM | POA: Diagnosis not present

## 2024-04-19 DIAGNOSIS — J329 Chronic sinusitis, unspecified: Secondary | ICD-10-CM

## 2024-04-19 DIAGNOSIS — R0981 Nasal congestion: Secondary | ICD-10-CM | POA: Diagnosis not present

## 2024-04-19 DIAGNOSIS — Z87891 Personal history of nicotine dependence: Secondary | ICD-10-CM | POA: Diagnosis not present

## 2024-04-19 DIAGNOSIS — J343 Hypertrophy of nasal turbinates: Secondary | ICD-10-CM

## 2024-04-19 DIAGNOSIS — J342 Deviated nasal septum: Secondary | ICD-10-CM

## 2024-04-19 DIAGNOSIS — J3489 Other specified disorders of nose and nasal sinuses: Secondary | ICD-10-CM

## 2024-04-19 MED ORDER — LEVOCETIRIZINE DIHYDROCHLORIDE 5 MG PO TABS
5.0000 mg | ORAL_TABLET | Freq: Every evening | ORAL | 3 refills | Status: DC
  Filled 2024-04-19: qty 30, 30d supply, fill #0

## 2024-04-19 MED ORDER — FLUTICASONE PROPIONATE 50 MCG/ACT NA SUSP
2.0000 | Freq: Two times a day (BID) | NASAL | 6 refills | Status: DC
  Filled 2024-04-19: qty 16, 30d supply, fill #0

## 2024-04-19 NOTE — Progress Notes (Signed)
 ENT CONSULT:  Reason for Consult: concern for nasal polyps   HPI: Discussed the use of AI scribe software for clinical note transcription with the patient, who gave verbal consent to proceed.  History of Present Illness [name] is a 73 year old male who presents with nasal congestion and suspected nasal polyp on the right side of the nose.   He experiences nasal congestion primarily on the right side, describing it as 'clogged.'. Initially, the congestion felt 'stuck on the wall' but now 'goes up and down. '.  He has a history of allergies, including reactions to dust, pollen, dust mites, and grass, which he describes as having been present 'my entire life I had a fever.'. He has not been taking any medication for these allergies recently.    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 Inconsistent lymphedema pump use noted. Increasing Lasix  may reduce swelling. - Increase Lasix  to 40 mg twice daily for a few days, then reduce to 40 mg daily for a week, and return to usual dose. - Double potassium supplementation to 20 mEq when taking increased Lasix  dose. - Refer to lymphedema clinic for further management and possible lymphedema massage therapy. - Recheck BMP and BNP in 1 week - Encourage consistent use of lymphedema pump for 30 minutes twice daily. - Encourage daily weight monitoring to assess fluid retention changes.   Hypertension Variable blood pressure with recent systolic up to 150 mmHg. Current regimen includes lisinopril . Increasing Lasix  may aid in lowering blood pressure. - Monitor blood pressure regularly at the wound care clinic. - Consider taking lisinopril  in the morning if daytime blood pressures remain elevated.   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 No cure or specific treatment available.   Deep Vein Thrombosis (DVT) Managed with Eliquis  to prevent further clotting events.   Insomnia Chronic insomnia possibly exacerbated by previous Levaquin  use. Difficulty sleeping through the night and waking early. May contribute to increased swelling due to prolonged sitting. -refer to PCP for treatment   Hx of CAD -Status post bare-metal stent to the LAD back in 1998 -Status post DES to RCA in 2015 -No chest pain or new SOB -Continue GDMT: Eliquis  5 mg twice daily, Zetia  10 mg daily,  lisinopril  10 mg daily, Crestor  5 mg daily     Past Medical History:  Diagnosis Date   Adenomatous colon polyp - diminutive 06/2019   no recall needed   Bilateral lower extremity edema    Coronary artery disease    a. s/p BMS to LAD (1998) b. s/p DES to RCA (06/24/14)   DVT (deep venous thrombosis) (HCC)    12/2020 L femoral and poplitea.  Unprovoked.  hypercoag w/u NEG.   Eliquis .   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 ?inclusion body myositis? Power WC as of 2022   Macrocytic anemia    Estab with cone hem/onc 08/2022   Osteoarthritis, multiple sites    Palatal fistula    Palpable abd. aorta  Aortic u/s NO ANEURISM 2020   RBBB    Spinal stenosis    Dr Royal Bury  surgery   Thrombocytopenia    Bone marrow biopsy benign 2025    Past Surgical History:  Procedure Laterality Date   APPENDECTOMY  2010   BONE MARROW BIOPSY     2025 benign   CERVICAL FUSION  09/2009   CHOLECYSTECTOMY  1998   COLONOSCOPY     2020-->no recall   EYE SURGERY     Lasik   HYDROCELE EXCISION  2005   LEFT HEART CATHETERIZATION WITH CORONARY ANGIOGRAM N/A 06/24/2014   Procedure: LEFT  HEART CATHETERIZATION WITH CORONARY ANGIOGRAM;  Surgeon: Debby DELENA Sor, MD;  Location: [facility] CATH LAB;  Service: Cardiovascular;  Laterality: N/A;   LUMBAR SPINE SURGERY  11/2011   MUSCLE BIOPSY Left 07/11/2018   Procedure: Left Vastus lateralus muscle biopsy;  Surgeon: Cheryle Debby DELENA, MD;  Location: [facility] OR;  Service: Neurosurgery;  Laterality: Left;  Left Vastus lateralus muscle biopsy   PERCUTANEOUS CORONARY STENT INTERVENTION (PCI-S)  06/24/2014   DES to RCA. Procedure: PERCUTANEOUS CORONARY STENT INTERVENTION (PCI-S);  Surgeon: Debby DELENA Sor, MD;  Location: [facility] CATH LAB;  Service: Cardiovascular;;  Distal RCA   Rhythm monitoring     04/2023, occ pvc's   TONSILLECTOMY AND ADENOIDECTOMY  2012   TOTAL HIP ARTHROPLASTY Right 2021   RIGHT   TRANSTHORACIC ECHOCARDIOGRAM     03/2019 normal.  Nov 2024 mild RV dysf, o/w normal    Family History  Problem Relation Age of Onset   Heart attack Mother    Esophageal cancer Mother    Aneurysm Father    Heart disease Father    Cystic fibrosis Brother        no prior health issues, rare form of cystic fibrosis of the lungs (aug 2016)   Stroke Neg Hx     Social History:  reports that he quit smoking about 34 years ago. His smoking use included cigarettes. He started smoking about 57 years ago. He has a 17.3 pack-year smoking history. He has never used smokeless tobacco. He reports current alcohol use. He reports that he does not use drugs.  Allergies: No Known Allergies  Medications: I have reviewed the patient's current medications.  The PMH, PSH, Medications, Allergies, and SH were reviewed and updated.  ROS: Constitutional: Negative for fever, weight loss and weight gain. Cardiovascular: Negative for chest pain and dyspnea on exertion. Respiratory: Is not experiencing shortness of breath at rest. Gastrointestinal: Negative for nausea and vomiting. Neurological: Negative for headaches. Psychiatric: The patient is not  nervous/anxious  Blood pressure 136/63, pulse 77, SpO2 (!) 88%. There is no height or weight on file to calculate BMI.  PHYSICAL EXAM:  Exam: General: Well-developed, well-nourished Communication and Voice: Clear pitch and clarity Respiratory Respiratory effort: Equal inspiration and expiration without stridor Cardiovascular Peripheral Vascular: Warm extremities with equal color/perfusion Eyes: No nystagmus with equal extraocular motion bilaterally Neuro/Psych/Balance: Patient oriented to person, place, and time; Appropriate mood and affect; Gait is intact with no imbalance; Cranial nerves I-XII are intact Head and Face Inspection: Normocephalic and atraumatic without mass or lesion Palpation: Facial skeleton intact without bony stepoffs Salivary Glands: No mass or tenderness Facial Strength: Facial motility symmetric and full bilaterally ENT Pinna: External ear intact and fully developed External canal: Canal is patent with intact skin Tympanic Membrane: Clear and mobile External Nose: No scar or anatomic deformity Internal Nose: Septum is S-shaped. Large polyp near entry into  R naris, no polyps on the left, no purulence. Mucosal edema and erythema present.  Bilateral inferior turbinate hypertrophy.  Lips, Teeth, and gums: Mucosa and teeth intact and viable TMJ: No pain to palpation with full mobility Oral cavity/oropharynx: No erythema or exudate, no lesions present Nasopharynx: No mass or lesion with intact mucosa Neck Neck and Trachea: Midline trachea without mass or lesion Thyroid : No mass or nodularity Lymphatics: No lymphadenopathy  Procedure:   PROCEDURE NOTE: nasal endoscopy  Preoperative diagnosis: chronic sinusitis symptoms  Postoperative diagnosis: same  Procedure: Diagnostic nasal endoscopy (68768)  Surgeon: Elena Larry, M.D.  Anesthesia: Topical lidocaine  and Afrin  H&P REVIEW: The patient's history and physical were reviewed today prior to  procedure. All medications were reviewed and updated as well. Complications: None Condition is stable throughout exam Indications and consent: The patient presents with symptoms of chronic sinusitis not responding to previous therapies. All the risks, benefits, and potential complications were reviewed with the patient preoperatively and informed consent was obtained. The time out was completed with confirmation of the correct procedure.   Procedure: The patient was seated upright in the clinic. Topical lidocaine  and Afrin were applied to the nasal cavity. After adequate anesthesia had occurred, the rigid nasal endoscope was passed into the nasal cavity. The nasal mucosa, turbinates, septum, and sinus drainage pathways were visualized bilaterally. This revealed no purulence or significant secretions that might be cultured. There was a large polyp on the right side. The mucosa was intact and there was no crusting present. The scope was then slowly withdrawn and the patient tolerated the procedure well. There were no complications or blood loss.   Studies Reviewed: CT chest 06/29/23 FINDINGS: Comment: Today's noncontrast examination is limited for detection and characterization of visceral and/or vascular lesions and is essentially useless for evaluation of pulmonary embolism.   Cardiovascular: Heart size is normal. There is no significant pericardial fluid, thickening or pericardial calcification. There is aortic atherosclerosis, as well as atherosclerosis of the great vessels of the mediastinum and the coronary arteries, including calcified atherosclerotic plaque in the left main, left anterior descending, left circumflex and right coronary arteries. Dilatation of the pulmonic trunk (4.0 cm in diameter).   Mediastinum/Nodes: No pathologically enlarged mediastinal or hilar lymph nodes. Please note that accurate exclusion of hilar adenopathy is limited on noncontrast CT scans. Esophagus is  unremarkable in appearance. No axillary lymphadenopathy.   Lungs/Pleura: Elevation of the right hemidiaphragm. Multiple calcified granulomas are noted, largest of which is in the left lower lobe. Other small noncalcified pulmonary nodules are noted, largest of which measures only 5 mm in the left lower lobe (axial image 106 of series 10). Peripheral predominant ground-glass attenuation and septal thickening is evident in the right lung. No similar findings are evident in the left lung. No traction bronchiectasis or honeycombing. Inspiratory and expiratory imaging is unremarkable. No acute consolidative airspace disease. No pleural effusions.   Upper Abdomen: Aortic atherosclerosis. Nonobstructive calculi measuring up to 1.2 cm in the right renal collecting system.   Musculoskeletal: There are no aggressive appearing lytic or blastic lesions noted in the visualized portions of the skeleton.   IMPRESSION: 1. For purposes of assessment of pulmonary embolism, today's study is considered nondiagnostic. 2. Mild fibrotic changes in the periphery of the right lung, without similar findings in the left lung. This is likely related to asymmetric blood flow and aeration (presumably secondary to parasitization of the right hemidiaphragm). Cases of asymmetric pulmonary fibrosis have been documented related to altered diaphragmatic  function, particularly in the setting of administered medications such as amiodarone. Further clinical evaluation is recommended. At this time, the imaging findings in the right lung are nonspecific and considered indeterminate for usual interstitial pneumonia (UIP) per current ATS guidelines. Repeat high-resolution chest CT should be considered in 12 months to assess for temporal changes in the appearance of the lung parenchyma. 3. Small calcified and noncalcified pulmonary nodules, with the largest noncalcified pulmonary nodule measuring only 5 mm in the left  lower lobe. No follow-up needed if patient is low-risk (and has no known or suspected primary neoplasm). Non-contrast chest CT can be considered in 12 months if patient is high-risk. This recommendation follows the consensus statement: Guidelines for  Assessment/Plan: Encounter Diagnoses  Name Primary?   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  Imaging needed to confirm diagnosis and rule out other conditions. Discussed surgical removal and non-surgical alternatives. - Order CT scan of the face to assess extent and rule out other conditions. - Consider nasal sprays for symptom management. Prescribed Xyzal  5 mf daily and Flonase  BID  Chronic nasal congestion and Environmental Allergies Lifelong allergic rhinitis with nasal congestion. Allergies may contribute to nasal polyp. - Consider nasal sprays for symptom management. Prescribed Xyzal  5 mf daily and Flonase  BID   Thank you for allowing me to participate in the care of this patient. Please do not hesitate to contact me with any questions or concerns.   Elena Larry, MD Otolaryngology [facility] Phone: [phone] Fax: [phone]    04/19/2024, 2:10 PM

## 2024-04-20 ENCOUNTER — Encounter (HOSPITAL_BASED_OUTPATIENT_CLINIC_OR_DEPARTMENT_OTHER): Admitting: General Surgery

## 2024-04-20 DIAGNOSIS — L97512 Non-pressure chronic ulcer of other part of right foot with fat layer exposed: Secondary | ICD-10-CM | POA: Diagnosis not present

## 2024-04-20 DIAGNOSIS — I890 Lymphedema, not elsewhere classified: Secondary | ICD-10-CM | POA: Diagnosis not present

## 2024-04-20 DIAGNOSIS — I872 Venous insufficiency (chronic) (peripheral): Secondary | ICD-10-CM | POA: Diagnosis not present

## 2024-04-20 DIAGNOSIS — I2510 Atherosclerotic heart disease of native coronary artery without angina pectoris: Secondary | ICD-10-CM | POA: Diagnosis not present

## 2024-04-20 DIAGNOSIS — L97812 Non-pressure chronic ulcer of other part of right lower leg with fat layer exposed: Secondary | ICD-10-CM | POA: Diagnosis not present

## 2024-04-20 DIAGNOSIS — L942 Calcinosis cutis: Secondary | ICD-10-CM | POA: Diagnosis not present

## 2024-04-24 ENCOUNTER — Other Ambulatory Visit (HOSPITAL_BASED_OUTPATIENT_CLINIC_OR_DEPARTMENT_OTHER): Payer: Self-pay

## 2024-04-24 ENCOUNTER — Ambulatory Visit (INDEPENDENT_AMBULATORY_CARE_PROVIDER_SITE_OTHER): Admitting: Family Medicine

## 2024-04-24 ENCOUNTER — Encounter: Payer: Self-pay | Admitting: Family Medicine

## 2024-04-24 VITALS — BP 132/70 | HR 66 | Temp 98.0°F | Ht 70.0 in | Wt 301.0 lb

## 2024-04-24 DIAGNOSIS — F5101 Primary insomnia: Secondary | ICD-10-CM | POA: Diagnosis not present

## 2024-04-24 DIAGNOSIS — I890 Lymphedema, not elsewhere classified: Secondary | ICD-10-CM | POA: Diagnosis not present

## 2024-04-24 DIAGNOSIS — N401 Enlarged prostate with lower urinary tract symptoms: Secondary | ICD-10-CM | POA: Diagnosis not present

## 2024-04-24 DIAGNOSIS — I10 Essential (primary) hypertension: Secondary | ICD-10-CM

## 2024-04-24 MED ORDER — ESZOPICLONE 3 MG PO TABS
3.0000 mg | ORAL_TABLET | Freq: Every day | ORAL | 5 refills | Status: DC
  Filled 2024-04-24: qty 30, 30d supply, fill #0
  Filled 2024-06-18: qty 30, 30d supply, fill #1

## 2024-04-24 MED ORDER — TAMSULOSIN HCL 0.4 MG PO CAPS
0.4000 mg | ORAL_CAPSULE | Freq: Every day | ORAL | 3 refills | Status: DC
  Filled 2024-04-24 – 2024-06-18 (×2): qty 90, 90d supply, fill #0

## 2024-04-24 NOTE — Progress Notes (Signed)
 OFFICE VISIT  04/24/2024  CC:  Chief Complaint  Patient presents with   Medical Management of Chronic Issues    Pt is not fasting   Back Pain    Patient is a [name] who presents for 47-month follow-up insomnia, BPH, hypertension.  INTERIM HX: The Lunesta  1 mg did not help him with his insomnia.  He only tried it a couple of times. He still initiates sleep fairly well but then wakes up around 2 AM and cannot go back to sleep.  As a result of his insomnia he gets up into his wheelchair and sits for long hours.  This has resulted in a gradual increase in swelling in his legs. He saw his cardiologist on 03/29/2024, Lasix  was increased temporarily.  Recheck of metabolic panel normal.  He feels like he is back around his baseline lower extremity swelling now.  He is taking the Lasix  40 mg every other day--> his usual chronic regimen.  He does feel like the Flomax  0.4 mg is helping well for his urinary symptoms.  Past Medical History:  Diagnosis Date   Adenomatous colon polyp - diminutive 06/2019   no recall needed   Allergy sinus   hay fever   Bilateral lower extremity edema    Cataract    Coronary artery disease    a. s/p BMS to LAD (1998) b. s/p DES to RCA (06/24/14)   DVT (deep venous thrombosis) (HCC)    12/2020 L femoral and poplitea.  Unprovoked.  hypercoag w/u NEG.   Eliquis .   Dyspnea    Fungal infection of lung    hx valley fever 2000   Glaucoma    Hay fever    Hemidiaphragm paralysis    right   HTN (hypertension)    Hyperlipidemia    Inclusion body myositis    Dx approx 2018, hx chronic CK elevation ( 415 CK, ALL MM CK subtype, seen by Rheumatology and considered normal Variant). ?inclusion body myositis? Power WC as of 2022   Macrocytic anemia    Estab with cone hem/onc 08/2022   Osteoarthritis, multiple sites    Palatal fistula    Palpable abd. aorta    Aortic u/s NO ANEURISM 2020   RBBB    Spinal stenosis    Dr Royal Bury  surgery   Thrombocytopenia     Bone marrow biopsy benign 2025    Past Surgical History:  Procedure Laterality Date   APPENDECTOMY  2010   BONE MARROW BIOPSY     2025 benign   CERVICAL FUSION  09/2009   CHOLECYSTECTOMY  1998   COLONOSCOPY     2020-->no recall   EYE SURGERY     Lasik   HYDROCELE EXCISION  2005   JOINT REPLACEMENT  05/22/20   right hip   LEFT HEART CATHETERIZATION WITH CORONARY ANGIOGRAM N/A 06/24/2014   Procedure: LEFT HEART CATHETERIZATION WITH CORONARY ANGIOGRAM;  Surgeon: Debby DELENA Sor, MD;  Location: [facility] CATH LAB;  Service: Cardiovascular;  Laterality: N/A;   LUMBAR SPINE SURGERY  11/2011   MUSCLE BIOPSY Left 07/11/2018   Procedure: Left Vastus lateralus muscle biopsy;  Surgeon: Cheryle Debby DELENA, MD;  Location: [facility] OR;  Service: Neurosurgery;  Laterality: Left;  Left Vastus lateralus muscle biopsy   PERCUTANEOUS CORONARY STENT INTERVENTION (PCI-S)  06/24/2014   DES to RCA. Procedure: PERCUTANEOUS CORONARY STENT INTERVENTION (PCI-S);  Surgeon: Debby DELENA Sor, MD;  Location: [facility] CATH LAB;  Service: Cardiovascular;;  Distal RCA   Rhythm monitoring  04/2023, occ pvc's   SPINE SURGERY     TONSILLECTOMY AND ADENOIDECTOMY  2012   TOTAL HIP ARTHROPLASTY Right 2021   RIGHT   TRANSTHORACIC ECHOCARDIOGRAM     03/2019 normal.  Nov 2024 mild RV dysf, o/w normal    Outpatient Medications Prior to Visit  Medication Sig Dispense Refill   acetaminophen  (TYLENOL ) 500 MG tablet Take by mouth as needed for moderate pain or mild pain.     apixaban  (ELIQUIS ) 2.5 MG TABS tablet Take 1 tablet (2.5 mg total) by mouth 2 (two) times daily. 180 tablet 3   atenolol  (TENORMIN ) 25 MG tablet Take 1 tablet (25 mg total) by mouth 2 (two) times daily. 180 tablet 3   b complex vitamins tablet Take 1 tablet by mouth daily.     cholecalciferol (VITAMIN D) 1000 units tablet Take 1,000 Units by mouth daily.     clotrimazole-betamethasone (LOTRISONE) cream Apply 1 Application topically as needed.     Coenzyme Q10 (CO Q 10)  100 MG CAPS Take 100 mg by mouth daily.      ezetimibe  (ZETIA ) 10 MG tablet Take 1 tablet (10 mg total) by mouth daily. 90 tablet 0   fluticasone  (FLONASE ) 50 MCG/ACT nasal spray Place 2 sprays into both nostrils 2 (two) times daily. 16 g 6   folic acid  (FOLVITE ) 800 MCG tablet Take 800 mcg by mouth daily.      furosemide  (LASIX ) 40 MG tablet Take 1 tablet (40mg ) by mouth twice daily for 2-3 days, then reduce to 1 tablet (40mg ) by mouth once daily for 2-3 days, then resume taking 1 tablet (40mg ) by mouth every other day. 45 tablet 0   glucosamine-chondroitin 500-400 MG tablet Take 2 tablets by mouth daily.      ibuprofen (ADVIL,MOTRIN) 800 MG tablet Take 800 mg by mouth daily.      Krill Oil 500 MG CAPS Take 500 mg by mouth daily.      levocetirizine (XYZAL  ALLERGY 24HR) 5 MG tablet Take 1 tablet (5 mg total) by mouth every evening. 30 tablet 3   lisinopril  (ZESTRIL ) 10 MG tablet Take 1 tablet (10 mg total) by mouth daily. 90 tablet 3   potassium chloride  (KLOR-CON ) 10 MEQ tablet Increase dose to 2 tablets by mouth daily for 3 days then 1 tablet daily for 1 week then resume taking 1 tablet (10 mEq total) by mouth every other day. 90 tablet 0   rosuvastatin  (CRESTOR ) 5 MG tablet TAKE 1 TABLET (5 MG TOTAL) BY MOUTH DAILY. 15 tablet 0   Saw Palmetto, Serenoa repens, (SAW PALMETTO PO) Take 300 mg by mouth daily.     tamsulosin  (FLOMAX ) 0.4 MG CAPS capsule TAKE 1 CAPSULE BY MOUTH EVERY DAY 90 capsule 0   No facility-administered medications prior to visit.    No Known Allergies  Review of Systems As per HPI  PE:    04/24/2024   10:55 AM 04/24/2024   10:29 AM 04/19/2024    1:40 PM  Vitals with BMI  Height  5' 10   Weight  301 lbs   BMI  43.19   Systolic 132 151 863  Diastolic 70 77 63  Pulse  66 77     Physical Exam  General: Alert and well-appearing, sitting in wheelchair. Cardiovascular: Regular rhythm and rate without murmur. Lungs clear to auscultation  bilaterally. Extremities: Unna boot wraps are present bilaterally.  No pitting above the knees.  LABS:  Last CBC Lab Results  Component Value Date  WBC 3.2 (L) 11/10/2023   HGB 12.3 (L) 11/10/2023   HCT 37.2 (L) 11/10/2023   MCV 103.9 (H) 11/10/2023   MCH 34.4 (H) 11/10/2023   RDW 13.2 11/10/2023   PLT 60 (L) 11/10/2023   Lab Results  Component Value Date   IRON 48 05/16/2019   TIBC 256 05/16/2019   FERRITIN 531 (H) 05/16/2019   Last metabolic panel Lab Results  Component Value Date   GLUCOSE 79 04/10/2024   NA 141 04/10/2024   K 5.2 04/10/2024   CL 102 04/10/2024   CO2 27 04/10/2024   BUN 30 (H) 04/10/2024   CREATININE 1.16 04/10/2024   EGFR 66 04/10/2024   CALCIUM  8.9 04/10/2024   PROT 6.1 (L) 11/10/2023   ALBUMIN 3.9 11/10/2023   LABGLOB 2.9 03/04/2023   AGRATIO 1.6 12/31/2020   BILITOT 0.6 11/10/2023   ALKPHOS 39 11/10/2023   AST 21 11/10/2023   ALT 14 11/10/2023   ANIONGAP 5 11/10/2023   Last lipids Lab Results  Component Value Date   CHOL 99 12/12/2023   HDL 53.40 12/12/2023   LDLCALC 36 12/12/2023   LDLDIRECT 65 05/16/2019   TRIG 45.0 12/12/2023   CHOLHDL 2 12/12/2023   Last hemoglobin A1c Lab Results  Component Value Date   HGBA1C 5.3 05/05/2023   Last thyroid  functions Lab Results  Component Value Date   TSH 1.600 05/05/2023   Last vitamin B12 and Folate Lab Results  Component Value Date   VITAMINB12 374 10/19/2023   FOLATE >40.0 10/19/2023   IMPRESSION AND PLAN:  #1 insomnia.  Chronic. Will do trial of max dose Lunesta --> 3 mg nightly.  2.  Hypertension, well-controlled on atenolol  25 mg twice daily and lisinopril  10 mg daily.  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 He is responding moderately well to the Flomax  0.4 mg nightly dose.  A trial of the 0.8 mg dose in the past did not help any better.  4.  Bilateral lower extremity edema/lymphedema:  Stable on 40 mg every other day regimen. There may be a need in  the future to temporarily increase his regimen (as noted in HPI above). Recent electrolytes and creatinine on 04/10/2024 stable.  An After Visit Summary was printed and given to the patient.  FOLLOW UP: Return in about 4 weeks (around 05/22/2024) for f/u insomnia.  Signed:  Gerlene Hockey, MD           04/24/2024

## 2024-04-25 ENCOUNTER — Other Ambulatory Visit (HOSPITAL_BASED_OUTPATIENT_CLINIC_OR_DEPARTMENT_OTHER): Payer: Self-pay

## 2024-04-27 ENCOUNTER — Encounter (HOSPITAL_BASED_OUTPATIENT_CLINIC_OR_DEPARTMENT_OTHER): Attending: Internal Medicine | Admitting: Internal Medicine

## 2024-04-27 DIAGNOSIS — I872 Venous insufficiency (chronic) (peripheral): Secondary | ICD-10-CM | POA: Diagnosis not present

## 2024-04-27 DIAGNOSIS — I890 Lymphedema, not elsewhere classified: Secondary | ICD-10-CM | POA: Diagnosis not present

## 2024-04-27 DIAGNOSIS — L97512 Non-pressure chronic ulcer of other part of right foot with fat layer exposed: Secondary | ICD-10-CM | POA: Diagnosis not present

## 2024-04-27 DIAGNOSIS — I2510 Atherosclerotic heart disease of native coronary artery without angina pectoris: Secondary | ICD-10-CM | POA: Insufficient documentation

## 2024-04-27 DIAGNOSIS — I10 Essential (primary) hypertension: Secondary | ICD-10-CM | POA: Insufficient documentation

## 2024-04-27 DIAGNOSIS — L97312 Non-pressure chronic ulcer of right ankle with fat layer exposed: Secondary | ICD-10-CM | POA: Insufficient documentation

## 2024-04-27 DIAGNOSIS — Z86718 Personal history of other venous thrombosis and embolism: Secondary | ICD-10-CM | POA: Diagnosis not present

## 2024-04-27 DIAGNOSIS — L942 Calcinosis cutis: Secondary | ICD-10-CM | POA: Diagnosis not present

## 2024-04-27 DIAGNOSIS — L97812 Non-pressure chronic ulcer of other part of right lower leg with fat layer exposed: Secondary | ICD-10-CM | POA: Diagnosis not present

## 2024-05-04 ENCOUNTER — Encounter (HOSPITAL_BASED_OUTPATIENT_CLINIC_OR_DEPARTMENT_OTHER): Admitting: General Surgery

## 2024-05-04 DIAGNOSIS — L97512 Non-pressure chronic ulcer of other part of right foot with fat layer exposed: Secondary | ICD-10-CM | POA: Diagnosis not present

## 2024-05-04 DIAGNOSIS — L97312 Non-pressure chronic ulcer of right ankle with fat layer exposed: Secondary | ICD-10-CM | POA: Diagnosis not present

## 2024-05-04 DIAGNOSIS — L942 Calcinosis cutis: Secondary | ICD-10-CM | POA: Diagnosis not present

## 2024-05-04 DIAGNOSIS — L97812 Non-pressure chronic ulcer of other part of right lower leg with fat layer exposed: Secondary | ICD-10-CM | POA: Diagnosis not present

## 2024-05-04 DIAGNOSIS — I2510 Atherosclerotic heart disease of native coronary artery without angina pectoris: Secondary | ICD-10-CM | POA: Diagnosis not present

## 2024-05-04 DIAGNOSIS — I890 Lymphedema, not elsewhere classified: Secondary | ICD-10-CM | POA: Diagnosis not present

## 2024-05-07 ENCOUNTER — Other Ambulatory Visit: Payer: Self-pay | Admitting: Internal Medicine

## 2024-05-07 DIAGNOSIS — E782 Mixed hyperlipidemia: Secondary | ICD-10-CM

## 2024-05-07 DIAGNOSIS — I10 Essential (primary) hypertension: Secondary | ICD-10-CM

## 2024-05-07 DIAGNOSIS — G729 Myopathy, unspecified: Secondary | ICD-10-CM

## 2024-05-08 ENCOUNTER — Encounter: Payer: Self-pay | Admitting: Internal Medicine

## 2024-05-09 ENCOUNTER — Other Ambulatory Visit: Payer: Self-pay | Admitting: Hematology and Oncology

## 2024-05-09 ENCOUNTER — Other Ambulatory Visit: Payer: Self-pay

## 2024-05-09 ENCOUNTER — Inpatient Hospital Stay: Admitting: Hematology and Oncology

## 2024-05-09 ENCOUNTER — Other Ambulatory Visit (HOSPITAL_BASED_OUTPATIENT_CLINIC_OR_DEPARTMENT_OTHER): Payer: Self-pay

## 2024-05-09 ENCOUNTER — Inpatient Hospital Stay: Attending: Hematology and Oncology

## 2024-05-09 VITALS — BP 146/68 | HR 64 | Temp 97.4°F | Resp 13 | Wt 300.3 lb

## 2024-05-09 DIAGNOSIS — K760 Fatty (change of) liver, not elsewhere classified: Secondary | ICD-10-CM | POA: Diagnosis not present

## 2024-05-09 DIAGNOSIS — D539 Nutritional anemia, unspecified: Secondary | ICD-10-CM | POA: Insufficient documentation

## 2024-05-09 DIAGNOSIS — Z86718 Personal history of other venous thrombosis and embolism: Secondary | ICD-10-CM

## 2024-05-09 DIAGNOSIS — Z800 Family history of malignant neoplasm of digestive organs: Secondary | ICD-10-CM | POA: Diagnosis not present

## 2024-05-09 DIAGNOSIS — Z7901 Long term (current) use of anticoagulants: Secondary | ICD-10-CM | POA: Insufficient documentation

## 2024-05-09 DIAGNOSIS — D696 Thrombocytopenia, unspecified: Secondary | ICD-10-CM | POA: Insufficient documentation

## 2024-05-09 DIAGNOSIS — I10 Essential (primary) hypertension: Secondary | ICD-10-CM

## 2024-05-09 DIAGNOSIS — Z87891 Personal history of nicotine dependence: Secondary | ICD-10-CM | POA: Diagnosis not present

## 2024-05-09 DIAGNOSIS — G729 Myopathy, unspecified: Secondary | ICD-10-CM

## 2024-05-09 DIAGNOSIS — E782 Mixed hyperlipidemia: Secondary | ICD-10-CM

## 2024-05-09 LAB — CMP (CANCER CENTER ONLY)
ALT: 14 U/L (ref 0–44)
AST: 21 U/L (ref 15–41)
Albumin: 3.7 g/dL (ref 3.5–5.0)
Alkaline Phosphatase: 61 U/L (ref 38–126)
Anion gap: 2 — ABNORMAL LOW (ref 5–15)
BUN: 22 mg/dL (ref 8–23)
CO2: 35 mmol/L — ABNORMAL HIGH (ref 22–32)
Calcium: 9.4 mg/dL (ref 8.9–10.3)
Chloride: 102 mmol/L (ref 98–111)
Creatinine: 0.92 mg/dL (ref 0.61–1.24)
GFR, Estimated: >60 mL/min (ref >60)
Glucose, Bld: 84 mg/dL (ref 70–99)
Potassium: 5.1 mmol/L (ref 3.5–5.1)
Sodium: 139 mmol/L (ref 135–145)
Total Bilirubin: 0.7 mg/dL (ref 0.0–1.2)
Total Protein: 6.2 g/dL — ABNORMAL LOW (ref 6.5–8.1)

## 2024-05-09 LAB — CBC WITH DIFFERENTIAL (CANCER CENTER ONLY)
Abs Immature Granulocytes: 0.02 K/uL (ref 0.00–0.07)
Basophils Absolute: 0 K/uL (ref 0.0–0.1)
Basophils Relative: 1 %
Eosinophils Absolute: 0.2 K/uL (ref 0.0–0.5)
Eosinophils Relative: 6 %
HCT: 35 % — ABNORMAL LOW (ref 39.0–52.0)
Hemoglobin: 11.4 g/dL — ABNORMAL LOW (ref 13.0–17.0)
Immature Granulocytes: 1 %
Lymphocytes Relative: 17 %
Lymphs Abs: 0.7 K/uL (ref 0.7–4.0)
MCH: 34.7 pg — ABNORMAL HIGH (ref 26.0–34.0)
MCHC: 32.6 g/dL (ref 30.0–36.0)
MCV: 106.4 fL — ABNORMAL HIGH (ref 80.0–100.0)
Monocytes Absolute: 0.5 K/uL (ref 0.1–1.0)
Monocytes Relative: 12 %
Neutro Abs: 2.6 K/uL (ref 1.7–7.7)
Neutrophils Relative %: 63 %
Platelet Count: 77 K/uL — ABNORMAL LOW (ref 150–400)
RBC: 3.29 MIL/uL — ABNORMAL LOW (ref 4.22–5.81)
RDW: 14.3 % (ref 11.5–15.5)
WBC Count: 4 K/uL (ref 4.0–10.5)
nRBC: 0 % (ref 0.0–0.2)

## 2024-05-09 LAB — LACTATE DEHYDROGENASE: LDH: 137 U/L (ref 98–192)

## 2024-05-09 MED ORDER — ATENOLOL 25 MG PO TABS
25.0000 mg | ORAL_TABLET | Freq: Two times a day (BID) | ORAL | 3 refills | Status: DC
  Filled 2024-05-09: qty 180, 90d supply, fill #0

## 2024-05-09 NOTE — Progress Notes (Signed)
 [facility] [phone]   [phone]  PROGRESS NOTE  Patient Care Team: Patient, No Pcp Per as PCP - General (General Practice) Okey Vina GAILS, MD as PCP - Cardiology (Cardiology) Okey Vina GAILS, MD as Consulting Physician (Cardiology) Lynnell Nottingham, MD as Consulting Physician (Dermatology)  Hematological/Oncological History 1) 01/02/2021: Presented to ED in [place] due to swelling and redness of the legs x 8 days.  Doppler US  revealed nonocclusive deep venous thrombus involving the left common femoral vein, proximal femoral vein and popliteal vein extending to the calf trifurcation. Stared Eliquis  therapy  2) 01/29/2022: Hypercoagulable panel ordered and did not show underlying clotting disorder.   3) 03/03/2022: Establish care with [facility]emat[facility] with Dr. Norleen Kidney and [place] [employer] PA-C  CHIEF COMPLAINTS/PURPOSE OF CONSULTATION:  Hx of DVT currently on Eliquis  2. Thrombocytopenia 3. Macrocytic anemia  HISTORY OF PRESENTING ILLNESS:  [name] 73 y.o. adult returns for a follow up for history of DVT and macrocytic anemia/thrombocytopenia. He was last seen by Dr. Kidney on 11/10/2023. He is unaccompanied for this visit.   On exam today, [name] reports he has been well overall in the interim since our last visit.  Here he reports that he has had no major changes in his health with no hospitalizations or ER visits.  He reports that his energy levels are not good.  As a result of his muscle disease.  He reports that he has been taking antibiotic therapy for lesions on his lower extremities.  He also retains water in his lower extremities and has been on Lasix  therapy.  He reports that he was taking 40 mg and it did significantly decrease his swelling.  He notes that he has not had no overt signs of bleeding, dark stools, or blood in the urine/stool, though he does have some bruising.  He reports he faithfully takes the Eliquis  2.5 mg twice daily.  The  medication price can be high but he believes the prices will be lowered next year.  He otherwise denies any fevers, chills, sweats, nausea, vomiting or diarrhea.  Full 10 point ROS otherwise negative.  MEDICAL HISTORY:  Past Medical History:  Diagnosis Date   Adenomatous colon polyp - diminutive 06/2019   no recall needed   Allergy sinus   hay fever   Bilateral lower extremity edema    Cataract    Coronary artery disease    a. s/p BMS to LAD (1998) b. s/p DES to RCA (06/24/14)   DVT (deep venous thrombosis) (HCC)    12/2020 L femoral and poplitea.  Unprovoked.  hypercoag w/u NEG.   Eliquis .   Dyspnea    Fungal infection of lung    hx valley fever 2000   Glaucoma    Hay fever    Hemidiaphragm paralysis    right   HTN (hypertension)    Hyperlipidemia    Inclusion body myositis    Dx approx 2018, hx chronic CK elevation ( 415 CK, ALL MM CK subtype, seen by Rheumatology and considered normal Variant). ?inclusion body myositis? Power WC as of 2022   Macrocytic anemia    Unknown etiology (bone marrow biopsy benign 2025)   Osteoarthritis, multiple sites    Palatal fistula    Palpable abd. aorta    Aortic u/s NO ANEURISM 2020   RBBB    Spinal stenosis    Dr Royal Bury  surgery   Thrombocytopenia    Uknown etiology (Bone marrow biopsy benign 2025)  SURGICAL HISTORY: Past Surgical History:  Procedure Laterality Date   APPENDECTOMY  2010   BONE MARROW BIOPSY     2025 benign   CERVICAL FUSION  09/2009   CHOLECYSTECTOMY  1998   COLONOSCOPY     2020-->no recall   EYE SURGERY     Lasik   HYDROCELE EXCISION  2005   JOINT REPLACEMENT  05/22/20   right hip   LEFT HEART CATHETERIZATION WITH CORONARY ANGIOGRAM N/A 06/24/2014   Procedure: LEFT HEART CATHETERIZATION WITH CORONARY ANGIOGRAM;  Surgeon: Debby DELENA Sor, MD;  Location: [facility] CATH LAB;  Service: Cardiovascular;  Laterality: N/A;   LUMBAR SPINE SURGERY  11/2011   MUSCLE BIOPSY Left 07/11/2018   Procedure: Left Vastus  lateralus muscle biopsy;  Surgeon: Cheryle Debby DELENA, MD;  Location: [facility] OR;  Service: Neurosurgery;  Laterality: Left;  Left Vastus lateralus muscle biopsy   PERCUTANEOUS CORONARY STENT INTERVENTION (PCI-S)  06/24/2014   DES to RCA. Procedure: PERCUTANEOUS CORONARY STENT INTERVENTION (PCI-S);  Surgeon: Debby DELENA Sor, MD;  Location: [facility] CATH LAB;  Service: Cardiovascular;;  Distal RCA   Rhythm monitoring     04/2023, occ pvc's   SPINE SURGERY     TONSILLECTOMY AND ADENOIDECTOMY  2012   TOTAL HIP ARTHROPLASTY Right 2021   RIGHT   TRANSTHORACIC ECHOCARDIOGRAM     03/2019 normal.  Nov 2024 mild RV dysf, o/w normal    SOCIAL HISTORY: Social History   Socioeconomic History   Marital status: Married    Spouse name: Not on file   Number of children: Not on file   Years of education: Not on file   Highest education level: Never attended school  Occupational History   Not on file  Tobacco Use   Smoking status: Former    Current packs/day: 0.00    Average packs/day: 0.8 packs/day for 23.0 years (17.3 ttl pk-yrs)    Types: Cigarettes    Start date: 07/26/1966    Quit date: 07/26/1989    Years since quitting: 34.8   Smokeless tobacco: Never  Vaping Use   Vaping status: Never Used  Substance and Sexual Activity   Alcohol use: Yes    Comment: 3-6 cans per week   Drug use: No   Sexual activity: Yes    Partners: Female    Birth control/protection: None    Comment: married  Other Topics Concern   Not on file  Social History Narrative   Married, 2 kids (one is Teacher, early years/pre at [employer]).   Educ: HS   Retired Dance movement psychotherapist   Patient is a former smoker, quit age 54   Alcohol use - yes, hx beer drinker, has approx 6 pack per week   Drug Use- no    caffeine 3 cups coffee daily   Enjoys motorcycles   Social Drivers of Corporate investment banker Strain: Low Risk  (04/23/2024)   Overall Financial Resource Strain (CARDIA)    Difficulty of Paying Living Expenses: Not hard at all  Food  Insecurity: No Food Insecurity (04/23/2024)   Hunger Vital Sign    Worried About Running Out of Food in the Last Year: Never true    Ran Out of Food in the Last Year: Never true  Transportation Needs: No Transportation Needs (04/23/2024)   PRAPARE - Administrator, Civil Service (Medical): No    Lack of Transportation (Non-Medical): No  Physical Activity: Inactive (04/23/2024)   Exercise Vital Sign    Days of Exercise per Week: 0  days    Minutes of Exercise per Session: Not on file  Stress: No Stress Concern Present (04/23/2024)   [employer] of Occupational Health - Occupational Stress Questionnaire    Feeling of Stress: Only a little  Social Connections: Socially Isolated (04/23/2024)   Social Connection and Isolation Panel    Frequency of Communication with Friends and Family: Once a week    Frequency of Social Gatherings with Friends and Family: Once a week    Attends Religious Services: Never    Database administrator or Organizations: No    Attends Engineer, structural: Not on file    Marital Status: Married  Catering manager Violence: Not At Risk (12/28/2023)   Humiliation, Afraid, Rape, and Kick questionnaire    Fear of Current or Ex-Partner: No    Emotionally Abused: No    Physically Abused: No    Sexually Abused: No    FAMILY HISTORY: Family History  Problem Relation Age of Onset   Heart attack Mother    Esophageal cancer Mother    Cancer Mother    Aneurysm Father    Heart disease Father    Early death Father    Cystic fibrosis Brother        no prior health issues, rare form of cystic fibrosis of the lungs (aug 06-08-2015)   Early death Brother    Stroke Neg Hx     ALLERGIES:  has no known allergies.  MEDICATIONS:  Current Outpatient Medications  Medication Sig Dispense Refill   acetaminophen  (TYLENOL ) 500 MG tablet Take by mouth as needed for moderate pain or mild pain.     apixaban  (ELIQUIS ) 2.5 MG TABS tablet Take 1 tablet (2.5 mg total)  by mouth 2 (two) times daily. 180 tablet 3   atenolol  (TENORMIN ) 25 MG tablet Take 1 tablet (25 mg total) by mouth 2 (two) times daily. 180 tablet 3   b complex vitamins tablet Take 1 tablet by mouth daily.     cholecalciferol (VITAMIN D) 1000 units tablet Take 1,000 Units by mouth daily.     clotrimazole-betamethasone (LOTRISONE) cream Apply 1 Application topically as needed.     Coenzyme Q10 (CO Q 10) 100 MG CAPS Take 100 mg by mouth daily.      eszopiclone  3 MG TABS Take 1 tablet (3 mg total) by mouth at bedtime. Take immediately before bedtime 30 tablet 5   ezetimibe  (ZETIA ) 10 MG tablet Take 1 tablet (10 mg total) by mouth daily. 90 tablet 0   fluticasone  (FLONASE ) 50 MCG/ACT nasal spray Place 2 sprays into both nostrils 2 (two) times daily. 16 g 6   folic acid  (FOLVITE ) 800 MCG tablet Take 800 mcg by mouth daily.      furosemide  (LASIX ) 40 MG tablet Take 1 tablet (40mg ) by mouth twice daily for 2-3 days, then reduce to 1 tablet (40mg ) by mouth once daily for 2-3 days, then resume taking 1 tablet (40mg ) by mouth every other day. 45 tablet 0   glucosamine-chondroitin 500-400 MG tablet Take 2 tablets by mouth daily.      ibuprofen (ADVIL,MOTRIN) 800 MG tablet Take 800 mg by mouth daily.      Krill Oil 500 MG CAPS Take 500 mg by mouth daily.      levocetirizine (XYZAL  ALLERGY 24HR) 5 MG tablet Take 1 tablet (5 mg total) by mouth every evening. 30 tablet 3   lisinopril  (ZESTRIL ) 10 MG tablet Take 1 tablet (10 mg total) by mouth daily. 90  tablet 3   potassium chloride  (KLOR-CON ) 10 MEQ tablet Increase dose to 2 tablets by mouth daily for 3 days then 1 tablet daily for 1 week then resume taking 1 tablet (10 mEq total) by mouth every other day. 90 tablet 0   rosuvastatin  (CRESTOR ) 5 MG tablet TAKE 1 TABLET (5 MG TOTAL) BY MOUTH DAILY. 15 tablet 0   Saw Palmetto, Serenoa repens, (SAW PALMETTO PO) Take 300 mg by mouth daily.     tamsulosin  (FLOMAX ) 0.4 MG CAPS capsule Take 1 capsule (0.4 mg total) by  mouth daily. 90 capsule 3   No current facility-administered medications for this visit.    REVIEW OF SYSTEMS:   Constitutional: ( - ) fevers, ( - )  chills , ( - ) night sweats Eyes: ( - ) blurriness of vision, ( - ) double vision, ( - ) watery eyes Ears, nose, mouth, throat, and face: ( - ) mucositis, ( - ) sore throat Respiratory: ( - ) cough, ( - ) dyspnea, ( - ) wheezes Cardiovascular: ( - ) palpitation, ( - ) chest discomfort, ( + ) lower extremity swelling Gastrointestinal:  ( - ) nausea, ( - ) heartburn, ( - ) change in bowel habits Skin: ( - ) abnormal skin rashes Lymphatics: ( - ) new lymphadenopathy, ( - ) easy bruising Neurological: ( - ) numbness, ( - ) tingling, ( - ) new weaknesses Behavioral/Psych: ( - ) mood change, ( - ) new changes  All other systems were reviewed with the patient and are negative.  PHYSICAL EXAMINATION: ECOG PERFORMANCE STATUS: 1 - Symptomatic but completely ambulatory  There were no vitals filed for this visit.    There were no vitals filed for this visit.     GENERAL: well appearing male in NAD  SKIN: skin color, texture, turgor are normal, no rashes or significant lesions EYES: conjunctiva are pink and non-injected, sclera clear LUNGS: clear to auscultation and percussion with normal breathing effort HEART: regular rate & rhythm and no murmurs. Bilateral lower extremity edema, wrapped so unable to assess ulcerations.  ABDOMEN: soft, non-tender, non-distended, normal bowel sounds Musculoskeletal: no cyanosis of digits and no clubbing  PSYCH: alert & oriented x 3, fluent speech NEURO: no focal motor/sensory deficits  LABORATORY DATA:  I have reviewed the data as listed    Latest Ref Rng & Units 11/10/2023    8:07 AM 10/31/2023    7:51 AM 10/19/2023   12:19 PM  CBC  WBC 4.0 - 10.5 K/uL 3.2  4.4  3.7   Hemoglobin 13.0 - 17.0 g/dL 87.6  87.2  87.4   Hematocrit 39.0 - 52.0 % 37.2  38.9  38.1   Platelets 150 - 400 K/uL 60  71  54         Latest Ref Rng & Units 04/10/2024    1:41 PM 03/23/2024    1:26 PM 02/20/2024    1:20 PM  CMP  Glucose 70 - 99 mg/dL 79  86  93   BUN 8 - 27 mg/dL 30  16  39   Creatinine 0.76 - 1.27 mg/dL 8.83  9.13  8.65   Sodium 134 - 144 mmol/L 141  142  140   Potassium 3.5 - 5.2 mmol/L 5.2  4.6  4.6   Chloride 96 - 106 mmol/L 102  100  100   CO2 20 - 29 mmol/L 27  27  27    Calcium  8.6 - 10.2 mg/dL 8.9  9.0  8.9     RADIOGRAPHIC STUDIES: I have personally reviewed the radiological images as listed and agreed with the findings in the report. No results found.   ASSESSMENT & PLAN [name] is a 73 y.o. adult returns for a follow up for history of DVT while on Eliquis  therapy.   #Unprovoked DVT --findings at this time are consistent with a unprovoked VTE --currently on Eliquis  2.5 mg PO twice daily. Recommend to continue --patient denies any bleeding, bruising, or dark stools on this medication. It is well tolerated. No difficulties accessing/affording the medication --RTC in 6 months' time with strict return precautions for overt signs of bleeding.   #Macrocytic anemia #Thrombocytopenia: --Workup from 03/12/2022 ruled out nutritional deficiencies, paraproteinemia,  hepatitis B/C, HIV --Abdominal US  from 03/25/2022 showed mild fatty liver otherwise no other liver disease or splenomegaly --Labs today show white blood cell 4.0, Hgb 11.4, MCV 106.4, Plt 77  --Discussed monitoring moving forward given that his bone marrow biopsy performed on 10/31/2023 showed no evidence of bone marrow disorder. -- We have not been able to identify a cause for his macrocytic anemia and thrombocytopenia.  Nutritional labs have been within normal limits.  Follow up: --RTC in 6 months to re-evaluate  No orders of the defined types were placed in this encounter.   All questions were answered. The patient knows to call the clinic with any problems, questions or concerns.  I have spent a total of 30 minutes  minutes of face-to-face and non-face-to-face time, preparing to see the patient, obtaining and/or reviewing separately obtained history, performing a medically appropriate examination, counseling and educating the patient, documenting clinical information in the electronic health record and care coordination.   Norleen IVAR Kidney, MD Department of Hematology/Oncology [facility] Phone: [phone] Pager: [phone] Email: [email or web address]

## 2024-05-09 NOTE — Telephone Encounter (Signed)
 RX sent in

## 2024-05-11 ENCOUNTER — Encounter (HOSPITAL_BASED_OUTPATIENT_CLINIC_OR_DEPARTMENT_OTHER): Admitting: Internal Medicine

## 2024-05-11 DIAGNOSIS — I890 Lymphedema, not elsewhere classified: Secondary | ICD-10-CM | POA: Diagnosis not present

## 2024-05-11 DIAGNOSIS — L942 Calcinosis cutis: Secondary | ICD-10-CM | POA: Diagnosis not present

## 2024-05-11 DIAGNOSIS — L97812 Non-pressure chronic ulcer of other part of right lower leg with fat layer exposed: Secondary | ICD-10-CM | POA: Diagnosis not present

## 2024-05-11 DIAGNOSIS — L97312 Non-pressure chronic ulcer of right ankle with fat layer exposed: Secondary | ICD-10-CM | POA: Diagnosis not present

## 2024-05-11 DIAGNOSIS — L97512 Non-pressure chronic ulcer of other part of right foot with fat layer exposed: Secondary | ICD-10-CM | POA: Diagnosis not present

## 2024-05-11 DIAGNOSIS — I2510 Atherosclerotic heart disease of native coronary artery without angina pectoris: Secondary | ICD-10-CM | POA: Diagnosis not present

## 2024-05-18 ENCOUNTER — Other Ambulatory Visit: Payer: Self-pay | Admitting: Internal Medicine

## 2024-05-18 ENCOUNTER — Encounter (HOSPITAL_BASED_OUTPATIENT_CLINIC_OR_DEPARTMENT_OTHER): Admitting: General Surgery

## 2024-05-18 DIAGNOSIS — I10 Essential (primary) hypertension: Secondary | ICD-10-CM

## 2024-05-18 DIAGNOSIS — L942 Calcinosis cutis: Secondary | ICD-10-CM | POA: Diagnosis not present

## 2024-05-18 DIAGNOSIS — L97812 Non-pressure chronic ulcer of other part of right lower leg with fat layer exposed: Secondary | ICD-10-CM | POA: Diagnosis not present

## 2024-05-18 DIAGNOSIS — I890 Lymphedema, not elsewhere classified: Secondary | ICD-10-CM | POA: Diagnosis not present

## 2024-05-18 DIAGNOSIS — L97312 Non-pressure chronic ulcer of right ankle with fat layer exposed: Secondary | ICD-10-CM | POA: Diagnosis not present

## 2024-05-18 DIAGNOSIS — E785 Hyperlipidemia, unspecified: Secondary | ICD-10-CM

## 2024-05-18 DIAGNOSIS — L97512 Non-pressure chronic ulcer of other part of right foot with fat layer exposed: Secondary | ICD-10-CM | POA: Diagnosis not present

## 2024-05-18 DIAGNOSIS — I2510 Atherosclerotic heart disease of native coronary artery without angina pectoris: Secondary | ICD-10-CM | POA: Diagnosis not present

## 2024-05-18 DIAGNOSIS — G729 Myopathy, unspecified: Secondary | ICD-10-CM

## 2024-05-21 ENCOUNTER — Other Ambulatory Visit (HOSPITAL_BASED_OUTPATIENT_CLINIC_OR_DEPARTMENT_OTHER): Payer: Self-pay

## 2024-05-21 MED ORDER — LISINOPRIL 10 MG PO TABS
10.0000 mg | ORAL_TABLET | Freq: Every day | ORAL | 3 refills | Status: DC
  Filled 2024-05-21: qty 90, 90d supply, fill #0

## 2024-05-21 MED ORDER — EZETIMIBE 10 MG PO TABS
10.0000 mg | ORAL_TABLET | Freq: Every day | ORAL | 3 refills | Status: AC
  Filled 2024-05-21: qty 90, 90d supply, fill #0

## 2024-05-22 ENCOUNTER — Encounter: Payer: Self-pay | Admitting: Family Medicine

## 2024-05-22 ENCOUNTER — Ambulatory Visit: Admitting: Family Medicine

## 2024-05-22 NOTE — Progress Notes (Deleted)
 OFFICE VISIT  05/22/2024  CC: No chief complaint on file.   Patient is a 73 y.o. adult who presents for 1 month follow-up insomnia. A/P as of last visit: #1 insomnia.  Chronic. Will do trial of max dose Lunesta --> 3 mg nightly.   2.  Hypertension, well-controlled on atenolol  25 mg twice daily and lisinopril  10 mg daily.   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 He is responding moderately well to the Flomax  0.4 mg nightly dose.  A trial of the 0.8 mg dose in the past did not help any better.  4.  Bilateral lower extremity edema/lymphedema:  Stable on 40 mg every other day regimen. There may be a need in the future to temporarily increase his regimen (as noted in HPI above). Recent electrolytes and creatinine on 04/10/2024 stable.  INTERIM HX: ***  Past Medical History:  Diagnosis Date   Adenomatous colon polyp - diminutive 06/2019   no recall needed   Allergy sinus   hay fever   Bilateral lower extremity edema    Cataract    Coronary artery disease    a. s/p BMS to LAD (1998) b. s/p DES to RCA (06/24/14)   DVT (deep venous thrombosis) (HCC)    12/2020 L femoral and poplitea.  Unprovoked.  hypercoag w/u NEG.   Eliquis .   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 ?inclusion body myositis? Power WC as of 2022   Macrocytic anemia    Unknown etiology (bone marrow biopsy benign 2025)   Osteoarthritis, multiple sites    Palatal fistula    Palpable abd. aorta    Aortic u/s NO ANEURISM 2020   RBBB    Spinal stenosis    Dr Royal Bury  surgery   Thrombocytopenia    Uknown etiology (Bone marrow biopsy benign 2025)    Past Surgical History:  Procedure Laterality Date   APPENDECTOMY   2010   BONE MARROW BIOPSY     2025 benign   CERVICAL FUSION  09/2009   CHOLECYSTECTOMY  1998   COLONOSCOPY     2020-->no recall   EYE SURGERY     Lasik   HYDROCELE EXCISION  2005   JOINT REPLACEMENT  05/22/20   right hip   LEFT HEART CATHETERIZATION WITH CORONARY ANGIOGRAM N/A 06/24/2014   Procedure: LEFT HEART CATHETERIZATION WITH CORONARY ANGIOGRAM;  Surgeon: Debby DELENA Sor, MD;  Location: [facility] CATH LAB;  Service: Cardiovascular;  Laterality: N/A;   LUMBAR SPINE SURGERY  11/2011   MUSCLE BIOPSY Left 07/11/2018   Procedure: Left Vastus lateralus muscle biopsy;  Surgeon: Cheryle Debby DELENA, MD;  Location: [facility] OR;  Service: Neurosurgery;  Laterality: Left;  Left Vastus lateralus muscle biopsy   PERCUTANEOUS CORONARY STENT INTERVENTION (PCI-S)  06/24/2014   DES to RCA. Procedure: PERCUTANEOUS CORONARY STENT INTERVENTION (PCI-S);  Surgeon: Debby DELENA Sor, MD;  Location: [facility] CATH LAB;  Service: Cardiovascular;;  Distal RCA   Rhythm monitoring     04/2023, occ pvc's   SPINE SURGERY     TONSILLECTOMY AND ADENOIDECTOMY  2012   TOTAL HIP ARTHROPLASTY Right 2021   RIGHT  TRANSTHORACIC ECHOCARDIOGRAM     03/2019 normal.  Nov 2024 mild RV dysf, o/w normal    Outpatient Medications Prior to Visit  Medication Sig Dispense Refill   acetaminophen  (TYLENOL ) 500 MG tablet Take by mouth as needed for moderate pain or mild pain.     apixaban  (ELIQUIS ) 2.5 MG TABS tablet Take 1 tablet (2.5 mg total) by mouth 2 (two) times daily. 180 tablet 3   atenolol  (TENORMIN ) 25 MG tablet Take 1 tablet (25 mg total) by mouth 2 (two) times daily. 180 tablet 3   b complex vitamins tablet Take 1 tablet by mouth daily.     cholecalciferol (VITAMIN D) 1000 units tablet Take 1,000 Units by mouth daily.     clotrimazole-betamethasone (LOTRISONE) cream Apply 1 Application topically as needed.     Coenzyme Q10 (CO Q 10) 100 MG CAPS Take 100 mg by mouth daily.      eszopiclone  3 MG TABS Take 1 tablet (3 mg total) by mouth  at bedtime. Take immediately before bedtime 30 tablet 5   ezetimibe  (ZETIA ) 10 MG tablet Take 1 tablet (10 mg total) by mouth daily. 90 tablet 3   fluticasone  (FLONASE ) 50 MCG/ACT nasal spray Place 2 sprays into both nostrils 2 (two) times daily. 16 g 6   folic acid  (FOLVITE ) 800 MCG tablet Take 800 mcg by mouth daily.      furosemide  (LASIX ) 40 MG tablet Take 1 tablet (40mg ) by mouth twice daily for 2-3 days, then reduce to 1 tablet (40mg ) by mouth once daily for 2-3 days, then resume taking 1 tablet (40mg ) by mouth every other day. 45 tablet 0   glucosamine-chondroitin 500-400 MG tablet Take 2 tablets by mouth daily.      ibuprofen (ADVIL,MOTRIN) 800 MG tablet Take 800 mg by mouth daily.      Krill Oil 500 MG CAPS Take 500 mg by mouth daily.      levocetirizine (XYZAL  ALLERGY 24HR) 5 MG tablet Take 1 tablet (5 mg total) by mouth every evening. 30 tablet 3   lisinopril  (ZESTRIL ) 10 MG tablet Take 1 tablet (10 mg total) by mouth daily. 90 tablet 3   potassium chloride  (KLOR-CON ) 10 MEQ tablet Increase dose to 2 tablets by mouth daily for 3 days then 1 tablet daily for 1 week then resume taking 1 tablet (10 mEq total) by mouth every other day. 90 tablet 0   rosuvastatin  (CRESTOR ) 5 MG tablet TAKE 1 TABLET (5 MG TOTAL) BY MOUTH DAILY. 15 tablet 0   Saw Palmetto, Serenoa repens, (SAW PALMETTO PO) Take 300 mg by mouth daily.     tamsulosin  (FLOMAX ) 0.4 MG CAPS capsule Take 1 capsule (0.4 mg total) by mouth daily. 90 capsule 3   No facility-administered medications prior to visit.    No Known Allergies  Review of Systems As per HPI  PE:    05/09/2024   11:37 AM 04/24/2024   10:55 AM 04/24/2024   10:29 AM  Vitals with BMI  Height   5' 10  Weight 300 lbs 5 oz  301 lbs  BMI 43.09  43.19  Systolic 146 132 848  Diastolic 68 70 77  Pulse 64  66     Physical Exam  ***  LABS:  Last metabolic panel Lab Results  Component Value Date   GLUCOSE 84 05/09/2024   NA 139 05/09/2024   K 5.1  05/09/2024   CL 102 05/09/2024   CO2 35 (H) 05/09/2024   BUN 22 05/09/2024  CREATININE 0.92 05/09/2024   GFRNONAA >60 05/09/2024   CALCIUM  9.4 05/09/2024   PROT 6.2 (L) 05/09/2024   ALBUMIN 3.7 05/09/2024   LABGLOB 2.9 03/04/2023   AGRATIO 1.6 12/31/2020   BILITOT 0.7 05/09/2024   ALKPHOS 61 05/09/2024   AST 21 05/09/2024   ALT 14 05/09/2024   ANIONGAP 2 (L) 05/09/2024   IMPRESSION AND PLAN:  No problem-specific Assessment & Plan notes found for this encounter.   An After Visit Summary was printed and given to the patient.  FOLLOW UP: No follow-ups on file.  Signed:  Gerlene Hockey, MD           05/22/2024

## 2024-05-25 ENCOUNTER — Encounter (HOSPITAL_BASED_OUTPATIENT_CLINIC_OR_DEPARTMENT_OTHER): Admitting: General Surgery

## 2024-05-25 DIAGNOSIS — I890 Lymphedema, not elsewhere classified: Secondary | ICD-10-CM | POA: Diagnosis not present

## 2024-05-25 DIAGNOSIS — L97812 Non-pressure chronic ulcer of other part of right lower leg with fat layer exposed: Secondary | ICD-10-CM | POA: Diagnosis not present

## 2024-05-25 DIAGNOSIS — I2510 Atherosclerotic heart disease of native coronary artery without angina pectoris: Secondary | ICD-10-CM | POA: Diagnosis not present

## 2024-05-25 DIAGNOSIS — L942 Calcinosis cutis: Secondary | ICD-10-CM | POA: Diagnosis not present

## 2024-05-25 DIAGNOSIS — L97512 Non-pressure chronic ulcer of other part of right foot with fat layer exposed: Secondary | ICD-10-CM | POA: Diagnosis not present

## 2024-05-25 DIAGNOSIS — L97312 Non-pressure chronic ulcer of right ankle with fat layer exposed: Secondary | ICD-10-CM | POA: Diagnosis not present

## 2024-06-01 ENCOUNTER — Encounter (HOSPITAL_BASED_OUTPATIENT_CLINIC_OR_DEPARTMENT_OTHER): Attending: General Surgery | Admitting: General Surgery

## 2024-06-01 DIAGNOSIS — I2510 Atherosclerotic heart disease of native coronary artery without angina pectoris: Secondary | ICD-10-CM | POA: Diagnosis not present

## 2024-06-01 DIAGNOSIS — I10 Essential (primary) hypertension: Secondary | ICD-10-CM | POA: Insufficient documentation

## 2024-06-01 DIAGNOSIS — Z86718 Personal history of other venous thrombosis and embolism: Secondary | ICD-10-CM | POA: Diagnosis not present

## 2024-06-01 DIAGNOSIS — L97822 Non-pressure chronic ulcer of other part of left lower leg with fat layer exposed: Secondary | ICD-10-CM | POA: Diagnosis not present

## 2024-06-01 DIAGNOSIS — L942 Calcinosis cutis: Secondary | ICD-10-CM | POA: Insufficient documentation

## 2024-06-01 DIAGNOSIS — I890 Lymphedema, not elsewhere classified: Secondary | ICD-10-CM | POA: Diagnosis not present

## 2024-06-01 DIAGNOSIS — L97812 Non-pressure chronic ulcer of other part of right lower leg with fat layer exposed: Secondary | ICD-10-CM | POA: Insufficient documentation

## 2024-06-01 DIAGNOSIS — I872 Venous insufficiency (chronic) (peripheral): Secondary | ICD-10-CM | POA: Diagnosis not present

## 2024-06-01 DIAGNOSIS — L97512 Non-pressure chronic ulcer of other part of right foot with fat layer exposed: Secondary | ICD-10-CM | POA: Diagnosis not present

## 2024-06-06 ENCOUNTER — Encounter: Payer: Self-pay | Admitting: Family Medicine

## 2024-06-06 ENCOUNTER — Ambulatory Visit (INDEPENDENT_AMBULATORY_CARE_PROVIDER_SITE_OTHER): Admitting: Family Medicine

## 2024-06-06 VITALS — BP 149/70 | HR 70 | Temp 97.6°F | Ht 70.0 in | Wt 299.8 lb

## 2024-06-06 DIAGNOSIS — F5101 Primary insomnia: Secondary | ICD-10-CM | POA: Diagnosis not present

## 2024-06-06 DIAGNOSIS — I10 Essential (primary) hypertension: Secondary | ICD-10-CM | POA: Diagnosis not present

## 2024-06-06 NOTE — Progress Notes (Addendum)
 OFFICE VISIT  06/06/2024  CC:  Chief Complaint  Patient presents with   Medical Management of Chronic Issues    Patient is a 73 y.o. adult who presents for 6-week follow-up insomnia. A/P as of last visit: #1 insomnia.  Chronic. Will do trial of max dose Lunesta --> 3 mg nightly.   2.  Hypertension, well-controlled on atenolol  25 mg twice daily and lisinopril  10 mg daily.   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 He is responding moderately well to the Flomax  0.4 mg nightly dose.  A trial of the 0.8 mg dose in the past did not help any better.   4.  Bilateral lower extremity edema/lymphedema:  Stable on 40 mg every other day regimen. There may be a need in the future to temporarily increase his regimen (as noted in HPI above). Recent electrolytes and creatinine on 04/10/2024 stable.  INTERIM HX: [name] has no acute concerns.  He says his sleep is still not that good.  Lunesta  3 mg minimally helpful.  He says some nights he will skip the Lunesta  and have 2 vodka drinks and he feels like that does about the same.  He typically gets about 3 to 5 hours of sleep and then lays in bed for a few hours. Mind will not shut down.  He has had this problem for decades.  He has a blood pressure cuff at home, does not monitor much though.  Latest Lasix  dose is 40 mg every other day.  Past Medical History:  Diagnosis Date   Adenomatous colon polyp - diminutive 06/2019   no recall needed   Allergy sinus   hay fever   Bilateral lower extremity edema    BPH with lower urinary tract symptoms without urinary obstruction    Cataract    Coronary artery disease    a. s/p BMS to LAD (1998) b. s/p DES to RCA (06/24/14)   DVT (deep venous thrombosis) (HCC)    12/2020 L femoral and poplitea.  Unprovoked.  hypercoag w/u NEG.   Eliquis .   Dyspnea    Fungal infection of lung    hx valley fever 2000   Glaucoma    Hay fever    Hemidiaphragm paralysis    right   HTN (hypertension)     Hyperlipidemia    Inclusion body myositis (HCC)    Dx approx 2018, hx chronic CK elevation ( 415 CK, ALL MM CK subtype, seen by Rheumatology and considered normal Variant). ?inclusion body myositis? Power WC as of 2022   Macrocytic anemia    Unknown etiology (bone marrow biopsy benign 2025)   Osteoarthritis, multiple sites    Palatal fistula    Palpable abd. aorta    Aortic u/s NO ANEURISM 2020   RBBB    Spinal stenosis    Dr Royal Bury  surgery   Thrombocytopenia    Uknown etiology (Bone marrow biopsy benign 2025)    Past Surgical History:  Procedure Laterality Date   APPENDECTOMY  2010   BONE MARROW BIOPSY     2025 benign   CERVICAL FUSION  09/2009   CHOLECYSTECTOMY  1998   COLONOSCOPY     2020-->no recall   EYE SURGERY     Lasik   HYDROCELE EXCISION  2005   JOINT REPLACEMENT  05/22/20   right hip   LEFT HEART CATHETERIZATION WITH CORONARY ANGIOGRAM N/A 06/24/2014   Procedure: LEFT HEART CATHETERIZATION WITH CORONARY ANGIOGRAM;  Surgeon: Debby DELENA Sor, MD;  Location: [facility]  CATH LAB;  Service: Cardiovascular;  Laterality: N/A;   LUMBAR SPINE SURGERY  11/2011   MUSCLE BIOPSY Left 07/11/2018   Procedure: Left Vastus lateralus muscle biopsy;  Surgeon: Cheryle Debby LABOR, MD;  Location: [facility] OR;  Service: Neurosurgery;  Laterality: Left;  Left Vastus lateralus muscle biopsy   PERCUTANEOUS CORONARY STENT INTERVENTION (PCI-S)  06/24/2014   DES to RCA. Procedure: PERCUTANEOUS CORONARY STENT INTERVENTION (PCI-S);  Surgeon: Debby LABOR Sor, MD;  Location: [facility] CATH LAB;  Service: Cardiovascular;;  Distal RCA   Rhythm monitoring     04/2023, occ pvc's   SPINE SURGERY     TONSILLECTOMY AND ADENOIDECTOMY  2012   TOTAL HIP ARTHROPLASTY Right 2021   RIGHT   TRANSTHORACIC ECHOCARDIOGRAM     03/2019 normal.  Nov 2024 mild RV dysf, o/w normal    Outpatient Medications Prior to Visit  Medication Sig Dispense Refill   acetaminophen  (TYLENOL ) 500 MG tablet Take by mouth as needed for moderate  pain or mild pain.     apixaban  (ELIQUIS ) 2.5 MG TABS tablet Take 1 tablet (2.5 mg total) by mouth 2 (two) times daily. 180 tablet 3   atenolol  (TENORMIN ) 25 MG tablet Take 1 tablet (25 mg total) by mouth 2 (two) times daily. 180 tablet 3   b complex vitamins tablet Take 1 tablet by mouth daily.     cholecalciferol (VITAMIN D) 1000 units tablet Take 1,000 Units by mouth daily.     clotrimazole-betamethasone (LOTRISONE) cream Apply 1 Application topically as needed.     Coenzyme Q10 (CO Q 10) 100 MG CAPS Take 100 mg by mouth daily.      eszopiclone  3 MG TABS Take 1 tablet (3 mg total) by mouth at bedtime. Take immediately before bedtime 30 tablet 5   ezetimibe  (ZETIA ) 10 MG tablet Take 1 tablet (10 mg total) by mouth daily. 90 tablet 3   fluticasone  (FLONASE ) 50 MCG/ACT nasal spray Place 2 sprays into both nostrils 2 (two) times daily. 16 g 6   folic acid  (FOLVITE ) 800 MCG tablet Take 800 mcg by mouth daily.      furosemide  (LASIX ) 40 MG tablet Take 1 tablet (40mg ) by mouth twice daily for 2-3 days, then reduce to 1 tablet (40mg ) by mouth once daily for 2-3 days, then resume taking 1 tablet (40mg ) by mouth every other day. 45 tablet 0   glucosamine-chondroitin 500-400 MG tablet Take 2 tablets by mouth daily.      ibuprofen (ADVIL,MOTRIN) 800 MG tablet Take 800 mg by mouth daily.      Krill Oil 500 MG CAPS Take 500 mg by mouth daily.      levocetirizine (XYZAL  ALLERGY 24HR) 5 MG tablet Take 1 tablet (5 mg total) by mouth every evening. 30 tablet 3   lisinopril  (ZESTRIL ) 10 MG tablet Take 1 tablet (10 mg total) by mouth daily. 90 tablet 3   potassium chloride  (KLOR-CON ) 10 MEQ tablet Increase dose to 2 tablets by mouth daily for 3 days then 1 tablet daily for 1 week then resume taking 1 tablet (10 mEq total) by mouth every other day. 90 tablet 0   rosuvastatin  (CRESTOR ) 5 MG tablet TAKE 1 TABLET (5 MG TOTAL) BY MOUTH DAILY. 15 tablet 0   Saw Palmetto, Serenoa repens, (SAW PALMETTO PO) Take 300 mg by  mouth daily.     tamsulosin  (FLOMAX ) 0.4 MG CAPS capsule Take 1 capsule (0.4 mg total) by mouth daily. 90 capsule 3   No facility-administered medications prior to  visit.    No Known Allergies  Review of Systems As per HPI  PE:    06/06/2024   10:54 AM 06/06/2024   10:49 AM 05/09/2024   11:37 AM  Vitals with BMI  Height  5' 10   Weight  299 lbs 13 oz 300 lbs 5 oz  BMI  43.02 43.09  Systolic 149 151 853  Diastolic 70 74 68  Pulse  70 64     Physical Exam  Gen: Alert, well appearing.  Patient is oriented to person, place, time, and situation. Affect is pleasant, thought and speech are lucid. No further exam today  LABS:  Last metabolic panel Lab Results  Component Value Date   GLUCOSE 84 05/09/2024   NA 139 05/09/2024   K 5.1 05/09/2024   CL 102 05/09/2024   CO2 35 (H) 05/09/2024   BUN 22 05/09/2024   CREATININE 0.92 05/09/2024   GFRNONAA >60 05/09/2024   CALCIUM  9.4 05/09/2024   PROT 6.2 (L) 05/09/2024   ALBUMIN 3.7 05/09/2024   LABGLOB 2.9 03/04/2023   AGRATIO 1.6 12/31/2020   BILITOT 0.7 05/09/2024   ALKPHOS 61 05/09/2024   AST 21 05/09/2024   ALT 14 05/09/2024   ANIONGAP 2 (L) 05/09/2024   IMPRESSION AND PLAN:  #1 primary insomnia.  He will continue Lunesta  3 mg as needed.  #2 hypertension.  His blood pressure is slightly labile.  Up into the 140s systolic at times, diastolic always in the 70s. Decided against any change in BP med regimen at this time--> 8 atenolol  25 mg twice daily and lisinopril  10 mg daily.  An After Visit Summary was printed and given to the patient.  FOLLOW UP: Return in about 6 months (around 12/04/2024) for routine chronic illness f/u.  Signed:  Gerlene Hockey, MD           06/06/2024

## 2024-06-08 ENCOUNTER — Encounter (HOSPITAL_BASED_OUTPATIENT_CLINIC_OR_DEPARTMENT_OTHER): Admitting: General Surgery

## 2024-06-08 DIAGNOSIS — I890 Lymphedema, not elsewhere classified: Secondary | ICD-10-CM | POA: Diagnosis not present

## 2024-06-08 DIAGNOSIS — I872 Venous insufficiency (chronic) (peripheral): Secondary | ICD-10-CM | POA: Diagnosis not present

## 2024-06-08 DIAGNOSIS — L97512 Non-pressure chronic ulcer of other part of right foot with fat layer exposed: Secondary | ICD-10-CM | POA: Diagnosis not present

## 2024-06-08 DIAGNOSIS — L97812 Non-pressure chronic ulcer of other part of right lower leg with fat layer exposed: Secondary | ICD-10-CM | POA: Diagnosis not present

## 2024-06-08 DIAGNOSIS — L97822 Non-pressure chronic ulcer of other part of left lower leg with fat layer exposed: Secondary | ICD-10-CM | POA: Diagnosis not present

## 2024-06-08 DIAGNOSIS — I2510 Atherosclerotic heart disease of native coronary artery without angina pectoris: Secondary | ICD-10-CM | POA: Diagnosis not present

## 2024-06-08 DIAGNOSIS — L942 Calcinosis cutis: Secondary | ICD-10-CM | POA: Diagnosis not present

## 2024-06-15 ENCOUNTER — Encounter (HOSPITAL_BASED_OUTPATIENT_CLINIC_OR_DEPARTMENT_OTHER): Admitting: General Surgery

## 2024-06-15 DIAGNOSIS — L97822 Non-pressure chronic ulcer of other part of left lower leg with fat layer exposed: Secondary | ICD-10-CM | POA: Diagnosis not present

## 2024-06-15 DIAGNOSIS — I872 Venous insufficiency (chronic) (peripheral): Secondary | ICD-10-CM | POA: Diagnosis not present

## 2024-06-15 DIAGNOSIS — I2510 Atherosclerotic heart disease of native coronary artery without angina pectoris: Secondary | ICD-10-CM | POA: Diagnosis not present

## 2024-06-15 DIAGNOSIS — L942 Calcinosis cutis: Secondary | ICD-10-CM | POA: Diagnosis not present

## 2024-06-15 DIAGNOSIS — I890 Lymphedema, not elsewhere classified: Secondary | ICD-10-CM | POA: Diagnosis not present

## 2024-06-15 DIAGNOSIS — L97512 Non-pressure chronic ulcer of other part of right foot with fat layer exposed: Secondary | ICD-10-CM | POA: Diagnosis not present

## 2024-06-15 DIAGNOSIS — L97812 Non-pressure chronic ulcer of other part of right lower leg with fat layer exposed: Secondary | ICD-10-CM | POA: Diagnosis not present

## 2024-06-17 ENCOUNTER — Encounter: Payer: Self-pay | Admitting: Internal Medicine

## 2024-06-18 ENCOUNTER — Other Ambulatory Visit: Payer: Self-pay | Admitting: Physician Assistant

## 2024-06-18 ENCOUNTER — Other Ambulatory Visit (HOSPITAL_BASED_OUTPATIENT_CLINIC_OR_DEPARTMENT_OTHER): Payer: Self-pay

## 2024-06-18 ENCOUNTER — Other Ambulatory Visit: Payer: Self-pay

## 2024-06-18 DIAGNOSIS — R600 Localized edema: Secondary | ICD-10-CM

## 2024-06-18 MED ORDER — ROSUVASTATIN CALCIUM 5 MG PO TABS
5.0000 mg | ORAL_TABLET | Freq: Every day | ORAL | 3 refills | Status: DC
  Filled 2024-06-18: qty 90, 90d supply, fill #0

## 2024-06-19 ENCOUNTER — Other Ambulatory Visit: Payer: Self-pay

## 2024-06-19 ENCOUNTER — Other Ambulatory Visit (HOSPITAL_BASED_OUTPATIENT_CLINIC_OR_DEPARTMENT_OTHER): Payer: Self-pay

## 2024-06-19 MED ORDER — FUROSEMIDE 40 MG PO TABS
ORAL_TABLET | ORAL | 0 refills | Status: DC
  Filled 2024-06-19: qty 45, 78d supply, fill #0

## 2024-06-29 ENCOUNTER — Ambulatory Visit: Admitting: Internal Medicine

## 2024-06-29 ENCOUNTER — Encounter (HOSPITAL_BASED_OUTPATIENT_CLINIC_OR_DEPARTMENT_OTHER): Attending: General Surgery | Admitting: General Surgery

## 2024-06-29 DIAGNOSIS — I509 Heart failure, unspecified: Secondary | ICD-10-CM | POA: Diagnosis not present

## 2024-06-29 DIAGNOSIS — L942 Calcinosis cutis: Secondary | ICD-10-CM | POA: Insufficient documentation

## 2024-06-29 DIAGNOSIS — L97812 Non-pressure chronic ulcer of other part of right lower leg with fat layer exposed: Secondary | ICD-10-CM | POA: Diagnosis present

## 2024-06-29 DIAGNOSIS — I110 Hypertensive heart disease with heart failure: Secondary | ICD-10-CM | POA: Diagnosis not present

## 2024-06-29 DIAGNOSIS — I2510 Atherosclerotic heart disease of native coronary artery without angina pectoris: Secondary | ICD-10-CM | POA: Diagnosis not present

## 2024-06-29 DIAGNOSIS — I739 Peripheral vascular disease, unspecified: Secondary | ICD-10-CM | POA: Diagnosis not present

## 2024-06-29 DIAGNOSIS — L97512 Non-pressure chronic ulcer of other part of right foot with fat layer exposed: Secondary | ICD-10-CM | POA: Diagnosis not present

## 2024-06-29 DIAGNOSIS — Z86718 Personal history of other venous thrombosis and embolism: Secondary | ICD-10-CM | POA: Insufficient documentation

## 2024-06-29 DIAGNOSIS — I890 Lymphedema, not elsewhere classified: Secondary | ICD-10-CM | POA: Diagnosis not present

## 2024-06-29 DIAGNOSIS — I872 Venous insufficiency (chronic) (peripheral): Secondary | ICD-10-CM | POA: Insufficient documentation

## 2024-07-04 NOTE — Progress Notes (Signed)
 Cardiology Office Note   Date:  07/05/2024   ID:  [name], DOB 07/29/1950, MRN [id number]  PCP:  Candise Aleene DEL, MD  Cardiologist:   Vina Gull, MD    F/U of CAD     History of Present Illness: [name] is a 73 y.o. adult with a history of CAD, HTN, HL, myositis, unprovoked DVT, venous insufficiency 2009  Pt underwent PTCA/Stent to LAD [facility]) 2011 2013  Myoview showed no ischemia ([place])  Nov 2015:  LHC  Patent stent in prox LAD;  Normal LAD otherwise  Normal LCx RCA (dominant) with  99% stenosis proximal to the mid vessel .Normal LV function without focal wall motion abnormalities LVEF 60%. The patient underwent PCI/DES to RCA (99 to 0%)   I saw the pt in 2024   He was seen by ONEIDA Fabry in September 2025  The pt had progressive swelling in legs    Limited activity   Taking 40 mg lasix  every other day.  Recomm 40 bid for a few days then 40 daily     Referred to lymphedema clinic   THe pt says since the appt he has not had much change in swelling   Says lasix  isn't working well    Denies CP  Breathing is OK    He is not active due to muscle weakness  Current Meds  Medication Sig   acetaminophen  (TYLENOL ) 500 MG tablet Take by mouth as needed for moderate pain or mild pain.   apixaban  (ELIQUIS ) 2.5 MG TABS tablet Take 1 tablet (2.5 mg total) by mouth 2 (two) times daily.   atenolol  (TENORMIN ) 25 MG tablet Take 1 tablet (25 mg total) by mouth 2 (two) times daily.   b complex vitamins tablet Take 1 tablet by mouth daily.   cholecalciferol (VITAMIN D) 1000 units tablet Take 1,000 Units by mouth daily.   clotrimazole-betamethasone (LOTRISONE) cream Apply 1 Application topically as needed.   Coenzyme Q10 (CO Q 10) 100 MG CAPS Take 100 mg by mouth daily.    ezetimibe  (ZETIA ) 10 MG tablet Take 1 tablet (10 mg total) by mouth daily.   fluticasone  (FLONASE ) 50 MCG/ACT nasal spray Place 2 sprays into both nostrils 2 (two) times daily.   folic acid  (FOLVITE ) 800 MCG tablet Take 800 mcg  by mouth daily.    furosemide  (LASIX ) 40 MG tablet Take 1 tablet (40mg ) by mouth twice daily for 2-3 days, then reduce to 1 tablet (40mg ) by mouth once daily for 2-3 days, then resume taking 1 tablet (40mg ) by mouth every other day.   glucosamine-chondroitin 500-400 MG tablet Take 2 tablets by mouth daily.    ibuprofen (ADVIL,MOTRIN) 800 MG tablet Take 800 mg by mouth daily.    Krill Oil 500 MG CAPS Take 500 mg by mouth daily.    levocetirizine (XYZAL  ALLERGY 24HR) 5 MG tablet Take 1 tablet (5 mg total) by mouth every evening.   lisinopril  (ZESTRIL ) 10 MG tablet Take 1 tablet (10 mg total) by mouth daily.   potassium chloride  (KLOR-CON ) 10 MEQ tablet Increase dose to 2 tablets by mouth daily for 3 days then 1 tablet daily for 1 week then resume taking 1 tablet (10 mEq total) by mouth every other day.   rosuvastatin  (CRESTOR ) 5 MG tablet Take 1 tablet (5 mg total) by mouth daily.   tamsulosin  (FLOMAX ) 0.4 MG CAPS capsule Take 1 capsule (0.4 mg total) by mouth daily.     Allergies:   Patient has no  known allergies.   Past Medical History:  Diagnosis Date   Adenomatous colon polyp - diminutive 06/2019   no recall needed   Allergy sinus   hay fever   Bilateral lower extremity edema    BPH with lower urinary tract symptoms without urinary obstruction    Cataract    Coronary artery disease    a. s/p BMS to LAD (1998) b. s/p DES to RCA (06/24/14)   DVT (deep venous thrombosis) (HCC)    12/2020 L femoral and poplitea.  Unprovoked.  hypercoag w/u NEG.   Eliquis .   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 ?inclusion body myositis? Power WC as of 2022   Macrocytic anemia    Unknown etiology (bone marrow biopsy benign  2025)   Osteoarthritis, multiple sites    Palatal fistula    Palpable abd. aorta    Aortic u/s NO ANEURISM 2020   RBBB    Spinal stenosis    Dr Royal Bury  surgery   Thrombocytopenia    Uknown etiology (Bone marrow biopsy benign 2025)    Past Surgical History:  Procedure Laterality Date   APPENDECTOMY  2010   BONE MARROW BIOPSY     2025 benign   CERVICAL FUSION  09/2009   CHOLECYSTECTOMY  1998   COLONOSCOPY     2020-->no recall   EYE SURGERY     Lasik   HYDROCELE EXCISION  2005   JOINT REPLACEMENT  05/22/20   right hip   LEFT HEART CATHETERIZATION WITH CORONARY ANGIOGRAM N/A 06/24/2014   Procedure: LEFT HEART CATHETERIZATION WITH CORONARY ANGIOGRAM;  Surgeon: Debby DELENA Sor, MD;  Location: [facility] CATH LAB;  Service: Cardiovascular;  Laterality: N/A;   LUMBAR SPINE SURGERY  11/2011   MUSCLE BIOPSY Left 07/11/2018   Procedure: Left Vastus lateralus muscle biopsy;  Surgeon: Cheryle Debby DELENA, MD;  Location: [facility] OR;  Service: Neurosurgery;  Laterality: Left;  Left Vastus lateralus muscle biopsy   PERCUTANEOUS CORONARY STENT INTERVENTION (PCI-S)  06/24/2014   DES to RCA. Procedure: PERCUTANEOUS CORONARY STENT INTERVENTION (PCI-S);  Surgeon: Debby DELENA Sor, MD;  Location: [facility] CATH LAB;  Service: Cardiovascular;;  Distal RCA   Rhythm monitoring     04/2023, occ pvc's   SPINE SURGERY     TONSILLECTOMY AND ADENOIDECTOMY  2012   TOTAL HIP ARTHROPLASTY Right 2021   RIGHT   TRANSTHORACIC ECHOCARDIOGRAM     03/2019 normal.  Nov 2024 mild RV dysf, o/w normal     Social History:  The patient  reports that he quit smoking about 34 years ago. His smoking use included cigarettes. He started smoking about 57 years ago. He has a 17.3 pack-year smoking history. He has never used smokeless tobacco. He reports current alcohol use. He reports that he does not use drugs.   Family History:  The patient's family history includes Aneurysm in his father; Cancer in his mother; Cystic fibrosis in his  brother; Early death in his brother and father; Esophageal cancer in his mother; Heart attack in his mother; Heart disease in his father.    ROS:  Please see the history of present  illness. All other systems are reviewed and  Negative to the above problem except as noted.    PHYSICAL EXAM: VS:  BP 128/70 (BP Location: Left Arm, Patient Position: Sitting, Cuff Size: Large)   Pulse (!) 58 Comment: 61 2nd attempt  Ht 5' 10 (1.778 m)   Wt (!) 302 lb (137 kg)   SpO2 91%   BMI 43.33 kg/m    GEN: Morbidly obese 73 yo in NAD  Examined in chair  HEENT: normal  Neck: JVP is not elevated  Cardiac: RRR; no murmurs, Respiratory:  clear to auscultation bilaterally, HP:Dnqu  Distended  Nontender  Ext   Legs wrapped    ++edema   EKG:  EKG not done       04/13/2019 echocardiogram IMPRESSIONS     1. Left ventricular ejection fraction, by visual estimation, is 60 to  65%. The left ventricle has normal function. Normal left ventricular size.  Left ventricular septal wall thickness was moderately increased. There is  no left ventricular hypertrophy.   2. Global right ventricle has normal systolic function.The right  ventricular size is normal. No increase in right ventricular wall  thickness.   3. Left atrial size was moderately dilated.   4. Right atrial size was normal.   5. Moderate thickening of the mitral valve leaflet(s).   6. The mitral valve is normal in structure. Mild mitral valve  regurgitation. No evidence of mitral stenosis.   7. The tricuspid valve is normal in structure. Tricuspid valve  regurgitation is mild.   8. The aortic valve is normal in structure. Aortic valve regurgitation is  mild by color flow Doppler. Mild aortic valve sclerosis without stenosis.   9. The pulmonic valve was grossly normal. Pulmonic valve regurgitation is  trivial by color flow Doppler.  10. The inferior vena cava is normal in size with greater than 50%  respiratory variability, suggesting right  atrial pressure of 3 mmHg.   Lipid Panel    Component Value Date/Time   CHOL 99 12/12/2023 1311   CHOL 116 12/31/2020 0856   TRIG 45.0 12/12/2023 1311   HDL 53.40 12/12/2023 1311   HDL 28 (L) 12/31/2020 0856   CHOLHDL 2 12/12/2023 1311   VLDL 9.0 12/12/2023 1311   LDLCALC 36 12/12/2023 1311   LDLCALC 70 12/31/2020 0856   LDLDIRECT 65 05/16/2019 1400      Wt Readings from Last 3 Encounters:  07/05/24 (!) 302 lb (137 kg)  06/06/24 299 lb 12.8 oz (136 kg)  05/09/24 (!) 300 lb 4.8 oz (136.2 kg)      ASSESSMENT AND PLAN:  1  CAD   Last intervention in 2015   Pt denies CP   2  Edema   Legs remain swollen  Will switch to torsemide    Start at 20   INcrease to 40 mg     Check BMET and BNP in 2 wks     3  HL  May 2025 LDL 36   HDL 53   Trig 45     4  HTN  BP remains controlled   Keep on same meds    Rx given for lift chair so he can elevate legs more   F/U in clinic in 2 months     Current medicines are reviewed at length with the patient today.  The patient does not have concerns regarding medicines.  Signed, Vina Gull, MD  07/05/2024 3:30 PM    [facility] [address], [place], [place]  [address] Phone: [phone]; Fax: [phone]

## 2024-07-05 ENCOUNTER — Encounter: Payer: Self-pay | Admitting: Internal Medicine

## 2024-07-05 ENCOUNTER — Ambulatory Visit: Attending: Internal Medicine | Admitting: Internal Medicine

## 2024-07-05 ENCOUNTER — Other Ambulatory Visit (HOSPITAL_BASED_OUTPATIENT_CLINIC_OR_DEPARTMENT_OTHER): Payer: Self-pay

## 2024-07-05 DIAGNOSIS — R0602 Shortness of breath: Secondary | ICD-10-CM | POA: Insufficient documentation

## 2024-07-05 DIAGNOSIS — Z79899 Other long term (current) drug therapy: Secondary | ICD-10-CM | POA: Diagnosis present

## 2024-07-05 DIAGNOSIS — I10 Essential (primary) hypertension: Secondary | ICD-10-CM | POA: Diagnosis present

## 2024-07-05 MED ORDER — TORSEMIDE 20 MG PO TABS
20.0000 mg | ORAL_TABLET | Freq: Every day | ORAL | 3 refills | Status: DC
  Filled 2024-07-05: qty 90, 90d supply, fill #0

## 2024-07-05 NOTE — Patient Instructions (Signed)
 Medication Instructions:  STOP taking Furosemide  (Lasix ) 40 mg.   Start taking Torsemide (Demadex) 20 mg. Take one (1) tablet by mouth once daily for the next few days. If no change in edema INCREASE dosage to 40 mg, take two (2) tablets by mouth daily.  *If you need a refill on your cardiac medications before your next appointment, please call your pharmacy*  Lab Work: BMET, BNP in 2 weeks If you have labs (blood work) drawn today and your tests are completely normal, you will receive your results only by: MyChart Message (if you have MyChart) OR A paper copy in the mail If you have any lab test that is abnormal or we need to change your treatment, we will call you to review the results.  Testing/Procedures: None ordered  Follow-Up: At [facility], you and your health needs are our priority.  As part of our continuing mission to provide you with exceptional heart care, our providers are all part of one team.  This team includes your primary Cardiologist (physician) and Advanced Practice Providers or APPs (Physician Assistants and Nurse Practitioners) who all work together to provide you with the care you need, when you need it.  Your next appointment:   2 month(s)  Provider:   Vina Gull, MD    We recommend signing up for the patient portal called MyChart.  Sign up information is provided on this After Visit Summary.  MyChart is used to connect with patients for Virtual Visits (Telemedicine).  Patients are able to view lab/test results, encounter notes, upcoming appointments, etc.  Non-urgent messages can be sent to your provider as well.   To learn more about what you can do with MyChart, go to [email or web address].

## 2024-07-06 ENCOUNTER — Encounter (HOSPITAL_BASED_OUTPATIENT_CLINIC_OR_DEPARTMENT_OTHER): Admitting: Internal Medicine

## 2024-07-06 DIAGNOSIS — L97812 Non-pressure chronic ulcer of other part of right lower leg with fat layer exposed: Secondary | ICD-10-CM | POA: Diagnosis not present

## 2024-07-09 ENCOUNTER — Emergency Department (HOSPITAL_BASED_OUTPATIENT_CLINIC_OR_DEPARTMENT_OTHER)

## 2024-07-09 ENCOUNTER — Inpatient Hospital Stay (HOSPITAL_BASED_OUTPATIENT_CLINIC_OR_DEPARTMENT_OTHER)
Admission: EM | Admit: 2024-07-09 | Discharge: 2024-07-20 | DRG: 291 | Disposition: A | Discharge status: Skilled Nursing Facility | Attending: Internal Medicine | Admitting: Internal Medicine

## 2024-07-09 ENCOUNTER — Other Ambulatory Visit: Payer: Self-pay

## 2024-07-09 ENCOUNTER — Encounter (HOSPITAL_BASED_OUTPATIENT_CLINIC_OR_DEPARTMENT_OTHER): Payer: Self-pay | Admitting: Emergency Medicine

## 2024-07-09 DIAGNOSIS — I77810 Thoracic aortic ectasia: Secondary | ICD-10-CM | POA: Diagnosis present

## 2024-07-09 DIAGNOSIS — G4700 Insomnia, unspecified: Secondary | ICD-10-CM | POA: Diagnosis present

## 2024-07-09 DIAGNOSIS — L03115 Cellulitis of right lower limb: Secondary | ICD-10-CM | POA: Diagnosis present

## 2024-07-09 DIAGNOSIS — G4733 Obstructive sleep apnea (adult) (pediatric): Secondary | ICD-10-CM | POA: Diagnosis present

## 2024-07-09 DIAGNOSIS — N400 Enlarged prostate without lower urinary tract symptoms: Secondary | ICD-10-CM | POA: Diagnosis present

## 2024-07-09 DIAGNOSIS — Z8349 Family history of other endocrine, nutritional and metabolic diseases: Secondary | ICD-10-CM

## 2024-07-09 DIAGNOSIS — R748 Abnormal levels of other serum enzymes: Secondary | ICD-10-CM | POA: Diagnosis not present

## 2024-07-09 DIAGNOSIS — G9341 Metabolic encephalopathy: Secondary | ICD-10-CM | POA: Diagnosis not present

## 2024-07-09 DIAGNOSIS — E8729 Other acidosis: Secondary | ICD-10-CM | POA: Diagnosis present

## 2024-07-09 DIAGNOSIS — Z86718 Personal history of other venous thrombosis and embolism: Secondary | ICD-10-CM

## 2024-07-09 DIAGNOSIS — Z981 Arthrodesis status: Secondary | ICD-10-CM

## 2024-07-09 DIAGNOSIS — G7241 Inclusion body myositis [IBM]: Secondary | ICD-10-CM | POA: Diagnosis present

## 2024-07-09 DIAGNOSIS — I5033 Acute on chronic diastolic (congestive) heart failure: Secondary | ICD-10-CM | POA: Diagnosis not present

## 2024-07-09 DIAGNOSIS — J9811 Atelectasis: Secondary | ICD-10-CM | POA: Diagnosis present

## 2024-07-09 DIAGNOSIS — E66813 Obesity, class 3: Secondary | ICD-10-CM | POA: Diagnosis present

## 2024-07-09 DIAGNOSIS — I878 Other specified disorders of veins: Secondary | ICD-10-CM | POA: Diagnosis present

## 2024-07-09 DIAGNOSIS — N329 Bladder disorder, unspecified: Secondary | ICD-10-CM | POA: Diagnosis present

## 2024-07-09 DIAGNOSIS — I890 Lymphedema, not elsewhere classified: Secondary | ICD-10-CM | POA: Diagnosis present

## 2024-07-09 DIAGNOSIS — Z23 Encounter for immunization: Secondary | ICD-10-CM

## 2024-07-09 DIAGNOSIS — M7989 Other specified soft tissue disorders: Secondary | ICD-10-CM | POA: Diagnosis not present

## 2024-07-09 DIAGNOSIS — R531 Weakness: Secondary | ICD-10-CM

## 2024-07-09 DIAGNOSIS — H9190 Unspecified hearing loss, unspecified ear: Secondary | ICD-10-CM | POA: Diagnosis present

## 2024-07-09 DIAGNOSIS — R918 Other nonspecific abnormal finding of lung field: Secondary | ICD-10-CM | POA: Diagnosis not present

## 2024-07-09 DIAGNOSIS — H409 Unspecified glaucoma: Secondary | ICD-10-CM | POA: Diagnosis present

## 2024-07-09 DIAGNOSIS — E785 Hyperlipidemia, unspecified: Secondary | ICD-10-CM | POA: Diagnosis present

## 2024-07-09 DIAGNOSIS — Z79899 Other long term (current) drug therapy: Secondary | ICD-10-CM

## 2024-07-09 DIAGNOSIS — D649 Anemia, unspecified: Secondary | ICD-10-CM | POA: Diagnosis present

## 2024-07-09 DIAGNOSIS — M609 Myositis, unspecified: Secondary | ICD-10-CM | POA: Diagnosis present

## 2024-07-09 DIAGNOSIS — N1330 Unspecified hydronephrosis: Secondary | ICD-10-CM | POA: Diagnosis not present

## 2024-07-09 DIAGNOSIS — A419 Sepsis, unspecified organism: Secondary | ICD-10-CM | POA: Diagnosis not present

## 2024-07-09 DIAGNOSIS — R600 Localized edema: Secondary | ICD-10-CM | POA: Diagnosis present

## 2024-07-09 DIAGNOSIS — L309 Dermatitis, unspecified: Secondary | ICD-10-CM | POA: Diagnosis present

## 2024-07-09 DIAGNOSIS — Z91199 Patient's noncompliance with other medical treatment and regimen due to unspecified reason: Secondary | ICD-10-CM

## 2024-07-09 DIAGNOSIS — M545 Low back pain, unspecified: Secondary | ICD-10-CM

## 2024-07-09 DIAGNOSIS — Z6841 Body Mass Index (BMI) 40.0 and over, adult: Secondary | ICD-10-CM

## 2024-07-09 DIAGNOSIS — Z8249 Family history of ischemic heart disease and other diseases of the circulatory system: Secondary | ICD-10-CM

## 2024-07-09 DIAGNOSIS — M5459 Other low back pain: Secondary | ICD-10-CM | POA: Diagnosis present

## 2024-07-09 DIAGNOSIS — G8929 Other chronic pain: Secondary | ICD-10-CM | POA: Diagnosis present

## 2024-07-09 DIAGNOSIS — R6521 Severe sepsis with septic shock: Secondary | ICD-10-CM | POA: Diagnosis not present

## 2024-07-09 DIAGNOSIS — N200 Calculus of kidney: Secondary | ICD-10-CM | POA: Diagnosis present

## 2024-07-09 DIAGNOSIS — Z955 Presence of coronary angioplasty implant and graft: Secondary | ICD-10-CM

## 2024-07-09 DIAGNOSIS — R234 Changes in skin texture: Secondary | ICD-10-CM | POA: Diagnosis present

## 2024-07-09 DIAGNOSIS — D696 Thrombocytopenia, unspecified: Secondary | ICD-10-CM | POA: Diagnosis present

## 2024-07-09 DIAGNOSIS — Z96641 Presence of right artificial hip joint: Secondary | ICD-10-CM | POA: Diagnosis present

## 2024-07-09 DIAGNOSIS — N179 Acute kidney failure, unspecified: Secondary | ICD-10-CM | POA: Diagnosis present

## 2024-07-09 DIAGNOSIS — I10 Essential (primary) hypertension: Secondary | ICD-10-CM | POA: Diagnosis present

## 2024-07-09 DIAGNOSIS — F32A Depression, unspecified: Secondary | ICD-10-CM | POA: Diagnosis present

## 2024-07-09 DIAGNOSIS — I2510 Atherosclerotic heart disease of native coronary artery without angina pectoris: Secondary | ICD-10-CM | POA: Diagnosis not present

## 2024-07-09 DIAGNOSIS — I872 Venous insufficiency (chronic) (peripheral): Secondary | ICD-10-CM

## 2024-07-09 DIAGNOSIS — Z87891 Personal history of nicotine dependence: Secondary | ICD-10-CM

## 2024-07-09 DIAGNOSIS — E875 Hyperkalemia: Secondary | ICD-10-CM | POA: Diagnosis not present

## 2024-07-09 DIAGNOSIS — J9622 Acute and chronic respiratory failure with hypercapnia: Secondary | ICD-10-CM | POA: Diagnosis not present

## 2024-07-09 DIAGNOSIS — Z7901 Long term (current) use of anticoagulants: Secondary | ICD-10-CM

## 2024-07-09 DIAGNOSIS — R0602 Shortness of breath: Secondary | ICD-10-CM | POA: Diagnosis not present

## 2024-07-09 DIAGNOSIS — I110 Hypertensive heart disease with heart failure: Principal | ICD-10-CM | POA: Diagnosis present

## 2024-07-09 DIAGNOSIS — I509 Heart failure, unspecified: Principal | ICD-10-CM

## 2024-07-09 DIAGNOSIS — I440 Atrioventricular block, first degree: Secondary | ICD-10-CM | POA: Diagnosis present

## 2024-07-09 DIAGNOSIS — M4802 Spinal stenosis, cervical region: Secondary | ICD-10-CM | POA: Diagnosis present

## 2024-07-09 LAB — I-STAT VENOUS BLOOD GAS, ED
Acid-Base Excess: 5 mmol/L — ABNORMAL HIGH (ref 0.0–2.0)
Acid-Base Excess: 4 mmol/L — ABNORMAL HIGH (ref 0.0–2.0)
Acid-Base Excess: 5 mmol/L — ABNORMAL HIGH (ref 0.0–2.0)
Bicarbonate: 33.7 mmol/L — ABNORMAL HIGH (ref 20.0–28.0)
Bicarbonate: 33 mmol/L — ABNORMAL HIGH (ref 20.0–28.0)
Bicarbonate: 33.6 mmol/L — ABNORMAL HIGH (ref 20.0–28.0)
Calcium, Ion: 1.16 mmol/L (ref 1.15–1.40)
Calcium, Ion: 1.21 mmol/L (ref 1.15–1.40)
Calcium, Ion: 1.22 mmol/L (ref 1.15–1.40)
HCT: 36 % — ABNORMAL LOW (ref 39.0–52.0)
HCT: 34 % — ABNORMAL LOW (ref 39.0–52.0)
HCT: 34 % — ABNORMAL LOW (ref 39.0–52.0)
Hemoglobin: 12.2 g/dL — ABNORMAL LOW (ref 13.0–17.0)
Hemoglobin: 11.6 g/dL — ABNORMAL LOW (ref 13.0–17.0)
Hemoglobin: 11.6 g/dL — ABNORMAL LOW (ref 13.0–17.0)
O2 Saturation: 55 %
O2 Saturation: 54 %
O2 Saturation: 46 %
Patient temperature: 97.1
Potassium: 4.4 mmol/L (ref 3.5–5.1)
Potassium: 4.4 mmol/L (ref 3.5–5.1)
Potassium: 4.4 mmol/L (ref 3.5–5.1)
Sodium: 137 mmol/L (ref 135–145)
Sodium: 138 mmol/L (ref 135–145)
Sodium: 139 mmol/L (ref 135–145)
TCO2: 36 mmol/L — ABNORMAL HIGH (ref 22–32)
TCO2: 35 mmol/L — ABNORMAL HIGH (ref 22–32)
TCO2: 36 mmol/L — ABNORMAL HIGH (ref 22–32)
pCO2, Ven: 71.7 mmHg (ref 44–60)
pCO2, Ven: 74 mmHg (ref 44–60)
pCO2, Ven: 74.5 mmHg (ref 44–60)
pH, Ven: 7.276 (ref 7.25–7.43)
pH, Ven: 7.257 (ref 7.25–7.43)
pH, Ven: 7.262 (ref 7.25–7.43)
pO2, Ven: 32 mmHg (ref 32–45)
pO2, Ven: 34 mmHg (ref 32–45)
pO2, Ven: 30 mmHg — CL (ref 32–45)

## 2024-07-09 LAB — TROPONIN T, HIGH SENSITIVITY
Troponin T High Sensitivity: 169 ng/L (ref 0–19)
Troponin T High Sensitivity: 154 ng/L (ref 0–19)

## 2024-07-09 LAB — HEPATIC FUNCTION PANEL
ALT: 35 U/L (ref 0–44)
AST: 104 U/L — ABNORMAL HIGH (ref 15–41)
Albumin: 3.7 g/dL (ref 3.5–5.0)
Alkaline Phosphatase: 77 U/L (ref 38–126)
Bilirubin, Direct: 0.3 mg/dL — ABNORMAL HIGH (ref 0.0–0.2)
Indirect Bilirubin: 0.4 mg/dL (ref 0.3–0.9)
Total Bilirubin: 0.6 mg/dL (ref 0.0–1.2)
Total Protein: 6.6 g/dL (ref 6.5–8.1)

## 2024-07-09 LAB — URINALYSIS, COMPLETE (UACMP) WITH MICROSCOPIC
Bacteria, UA: NONE SEEN
Bilirubin Urine: NEGATIVE
Glucose, UA: NEGATIVE mg/dL
Ketones, ur: NEGATIVE mg/dL
Leukocytes,Ua: NEGATIVE
Nitrite: NEGATIVE
Protein, ur: NEGATIVE mg/dL
Specific Gravity, Urine: 1.01 (ref 1.005–1.030)
pH: 5 (ref 5.0–8.0)

## 2024-07-09 LAB — BASIC METABOLIC PANEL WITH GFR
Anion gap: 10 (ref 5–15)
BUN: 44 mg/dL — ABNORMAL HIGH (ref 8–23)
CO2: 29 mmol/L (ref 22–32)
Calcium: 9.2 mg/dL (ref 8.9–10.3)
Chloride: 101 mmol/L (ref 98–111)
Creatinine, Ser: 1.98 mg/dL — ABNORMAL HIGH (ref 0.61–1.24)
GFR, Estimated: 35 mL/min — ABNORMAL LOW (ref >60)
Glucose, Bld: 95 mg/dL (ref 70–99)
Potassium: 4.8 mmol/L (ref 3.5–5.1)
Sodium: 140 mmol/L (ref 135–145)

## 2024-07-09 LAB — CBC
HCT: 34.5 % — ABNORMAL LOW (ref 39.0–52.0)
Hemoglobin: 11.2 g/dL — ABNORMAL LOW (ref 13.0–17.0)
MCH: 34.6 pg — ABNORMAL HIGH (ref 26.0–34.0)
MCHC: 32.5 g/dL (ref 30.0–36.0)
MCV: 106.5 fL — ABNORMAL HIGH (ref 80.0–100.0)
Platelets: 71 K/uL — ABNORMAL LOW (ref 150–400)
RBC: 3.24 MIL/uL — ABNORMAL LOW (ref 4.22–5.81)
RDW: 13.9 % (ref 11.5–15.5)
WBC: 6.3 K/uL (ref 4.0–10.5)
nRBC: 0 % (ref 0.0–0.2)

## 2024-07-09 LAB — PRO BRAIN NATRIURETIC PEPTIDE: Pro Brain Natriuretic Peptide: 909 pg/mL — ABNORMAL HIGH (ref <300.0)

## 2024-07-09 LAB — CK: Total CK: 1802 U/L — ABNORMAL HIGH (ref 49–397)

## 2024-07-09 MED ORDER — IPRATROPIUM-ALBUTEROL 0.5-2.5 (3) MG/3ML IN SOLN
6.0000 mL | RESPIRATORY_TRACT | Status: AC
  Administered 2024-07-09: 21:00:00 6 mL via RESPIRATORY_TRACT
  Filled 2024-07-09: qty 6

## 2024-07-09 MED ORDER — FUROSEMIDE 10 MG/ML IJ SOLN
40.0000 mg | Freq: Once | INTRAMUSCULAR | Status: AC
  Administered 2024-07-09: 15:00:00 40 mg via INTRAVENOUS
  Filled 2024-07-09: qty 4

## 2024-07-09 MED ORDER — METHYLPREDNISOLONE SODIUM SUCC 125 MG IJ SOLR
125.0000 mg | INTRAMUSCULAR | Status: AC
  Administered 2024-07-09: 21:00:00 125 mg via INTRAVENOUS
  Filled 2024-07-09: qty 2

## 2024-07-09 MED ORDER — VANCOMYCIN HCL IN DEXTROSE 1-5 GM/200ML-% IV SOLN
1000.0000 mg | Freq: Once | INTRAVENOUS | Status: AC
  Administered 2024-07-09: 16:00:00 1000 mg via INTRAVENOUS
  Filled 2024-07-09: qty 200

## 2024-07-09 MED ORDER — CEFAZOLIN SODIUM-DEXTROSE 2-4 GM/100ML-% IV SOLN
2.0000 g | Freq: Three times a day (TID) | INTRAVENOUS | Status: DC
  Administered 2024-07-09 – 2024-07-12 (×10): 2 g via INTRAVENOUS
  Filled 2024-07-09 (×11): qty 100

## 2024-07-09 MED ORDER — SODIUM CHLORIDE 0.9 % IV SOLN
2.0000 g | Freq: Once | INTRAVENOUS | Status: AC
  Administered 2024-07-09: 15:00:00 2 g via INTRAVENOUS
  Filled 2024-07-09: qty 12.5

## 2024-07-09 MED ORDER — APIXABAN 2.5 MG PO TABS
2.5000 mg | ORAL_TABLET | Freq: Two times a day (BID) | ORAL | Status: DC
  Administered 2024-07-09 – 2024-07-20 (×22): 2.5 mg via ORAL
  Filled 2024-07-09 (×22): qty 1

## 2024-07-09 MED ORDER — VANCOMYCIN HCL IN DEXTROSE 1-5 GM/200ML-% IV SOLN
1000.0000 mg | Freq: Once | INTRAVENOUS | Status: AC
  Administered 2024-07-09: 17:00:00 1000 mg via INTRAVENOUS
  Filled 2024-07-09: qty 200

## 2024-07-09 MED ORDER — VANCOMYCIN HCL IN DEXTROSE 1-5 GM/200ML-% IV SOLN: 1000.0000 mg | Freq: Once | INTRAVENOUS | Status: DC

## 2024-07-09 NOTE — ED Notes (Signed)
 Critcal VBG results given to PA [facility]. No new orders given.

## 2024-07-09 NOTE — Assessment & Plan Note (Addendum)
 Hyperkalemia. Impending rhabdomyolysis (present on admission)   Today with stable renal function with serum cr at 0,63 with K at 4,5 and serum bicarbonate at 33 Na 139 and Mg 2.0  Continue to hold on diuretic therapy.

## 2024-07-09 NOTE — Assessment & Plan Note (Deleted)
 Chronic venous insufficiency Chronic lower extremity wounds -followed by wound care clinic long-term Now with new wounds/skin breakdown related to his severe edema with weeping. --Holding further diuresis as above -- Wound care consulted

## 2024-07-09 NOTE — ED Notes (Signed)
 I stat performed by RT.

## 2024-07-09 NOTE — Assessment & Plan Note (Addendum)
 Resume home dose of rosuvastatin   Ck is down to 85.

## 2024-07-09 NOTE — Assessment & Plan Note (Addendum)
 Lower extremity doppler US  was negative for left lower extremity deep venous thrombosis.  Continue anticoagulation with apixaban .

## 2024-07-09 NOTE — Assessment & Plan Note (Addendum)
 Continue as needed analgesics.  PT and OT

## 2024-07-09 NOTE — Assessment & Plan Note (Addendum)
 Complicated with septic shock, now resolved.   Patient has been afebrile, Wbc  is 5.1   Patient was treated with broad spectrum antibiotic therapy with linezolid  and cefepime  with good toleration.   Continue local wound care.

## 2024-07-09 NOTE — Assessment & Plan Note (Deleted)
 Patient presenting with progressively worsening swelling, dyspnea worse on exertion, orthopnea with progressive symptoms despite increase in oral diuretics just 4 days ago as outpatient with cardiology.   Last echo in November 2024 was poor acoustic windows and limited but showed normal EF 55 to 60%, no LV regional WMA's, mild LVH, mild MR. Given 40 mg IV Lasix  in the ED. -- Admit to telemetry bed for further evaluation --Cardiology consulted, appreciate recommendations --Will hold further diuresis for now, given AKI and assess response to Lasix  given in the ED first --Strict I/O's and daily weights --Monitor renal function and electrolytes --Repeat echo ordered

## 2024-07-09 NOTE — Assessment & Plan Note (Deleted)
 PT and OT evaluations Fall precautions

## 2024-07-09 NOTE — ED Provider Notes (Signed)
 Repeat blood gas shows that the patient's pH has gone from 7.27-7.25 with a pCO2 that went from 72-74.  Largely unchanged but will increase his rate and pressure support.  Still is mentating well. Will send repeat gas in 45 mins.    Yolande Lamar BROCKS, MD 07/09/24 [phone]

## 2024-07-09 NOTE — Assessment & Plan Note (Addendum)
 High sensitive troponin elevation due to sepsis, no acute coronary syndrome.  Continue blood pressure control, not on statin or antiplatelet therapy

## 2024-07-09 NOTE — Assessment & Plan Note (Deleted)
 Etiology unclear, CK elevated at 1802 on admission.  No reported trauma history or prolonged downtime, but patient is quite sedentary at baseline.  He had a normal CK level 1 year ago at 182.  Does have baseline inclusion body myositis. Currently with AKI, creatinine 1.98 --Add on hepatic function panel --Check urine myoglobin --Holding further diuresis as above, but will also hold off fluids given the severity of his edema and dyspneic symptoms consistent with fluid overload. --Trend CK and further evaluation indicated

## 2024-07-09 NOTE — ED Notes (Signed)
 Ultrasound at bedside

## 2024-07-09 NOTE — Assessment & Plan Note (Addendum)
 Continue blood pressure control with atenolol .

## 2024-07-09 NOTE — ED Notes (Signed)
 Critical VBG results given to Dr. Yolande. Orders given to increase backup rate to 20, IPAP to 17. VBG to be repeated in 45 minutes. See flowsheet for documentation.

## 2024-07-09 NOTE — ED Provider Notes (Signed)
 [place] EMERGENCY DEPARTMENT AT [facility] Provider Note   CSN: [id number] Arrival date & time: 07/09/24  1147     Patient presents with: Leg Swelling   [name] is a 73 y.o. adult with past medical history significant for CAD, hypertension, arthritis, DOACs on chronic anticoagulation, he has inclusion body myositis, history of hemidiaphragm paralysis, he endorses increasing lower leg swelling, shortness of breath, orthopnea.  Increased weakness.  He has chronic leg swelling and leg wounds.  He is on chronic antibiotics for ulcers on the legs.  He reports that he has been taking levofloxacin  for a year--after checking with his daughter it does not seem like he has taken anything for antibiotics in at least 6 months.  Denies any chest pain.  Recently switched from Lasix  to torsemide  and reports that leg swelling has worsened.   HPI     Prior to Admission medications  Medication Sig Start Date End Date Taking? Authorizing Provider  acetaminophen  (TYLENOL ) 500 MG tablet Take by mouth as needed for moderate pain or mild pain.    [provider]  apixaban  (ELIQUIS ) 2.5 MG TABS tablet Take 1 tablet (2.5 mg total) by mouth 2 (two) times daily. 07/04/23   Okey Vina GAILS, MD  atenolol  (TENORMIN ) 25 MG tablet Take 1 tablet (25 mg total) by mouth 2 (two) times daily. 05/09/24   Okey Vina GAILS, MD  b complex vitamins tablet Take 1 tablet by mouth daily.    [provider]  cholecalciferol (VITAMIN D) 1000 units tablet Take 1,000 Units by mouth daily.    [provider]  clotrimazole-betamethasone (LOTRISONE) cream Apply 1 Application topically as needed. 12/23/21   [provider]  Coenzyme Q10 (CO Q 10) 100 MG CAPS Take 100 mg by mouth daily.     [provider]  eszopiclone  3 MG TABS Take 1 tablet (3 mg total) by mouth at bedtime. Take immediately before bedtime Patient not taking: Reported on 07/05/2024 04/24/24   McGowen, Philip H, MD   ezetimibe  (ZETIA ) 10 MG tablet Take 1 tablet (10 mg total) by mouth daily. 05/21/24   Conte, Tessa N, PA-C  fluticasone  (FLONASE ) 50 MCG/ACT nasal spray Place 2 sprays into both nostrils 2 (two) times daily. 04/19/24   Soldatova, Liuba, MD  folic acid  (FOLVITE ) 800 MCG tablet Take 800 mcg by mouth daily.     [provider]  glucosamine-chondroitin 500-400 MG tablet Take 2 tablets by mouth daily.     [provider]  ibuprofen (ADVIL,MOTRIN) 800 MG tablet Take 800 mg by mouth daily.     [provider]  Anselm Oil 500 MG CAPS Take 500 mg by mouth daily.     [provider]  levocetirizine (XYZAL  ALLERGY 24HR) 5 MG tablet Take 1 tablet (5 mg total) by mouth every evening. 04/19/24   Soldatova, Liuba, MD  lisinopril  (ZESTRIL ) 10 MG tablet Take 1 tablet (10 mg total) by mouth daily. 05/21/24   Lucien Orren SAILOR, PA-C  potassium chloride  (KLOR-CON ) 10 MEQ tablet Increase dose to 2 tablets by mouth daily for 3 days then 1 tablet daily for 1 week then resume taking 1 tablet (10 mEq total) by mouth every other day. 04/16/24   Lucien Orren SAILOR, PA-C  rosuvastatin  (CRESTOR ) 5 MG tablet Take 1 tablet (5 mg total) by mouth daily. 06/18/24   Okey Vina GAILS, MD  Saw Palmetto, Serenoa repens, (SAW PALMETTO PO) Take 300 mg by mouth daily. Patient not taking: Reported on 07/05/2024  [provider]  tamsulosin  (FLOMAX ) 0.4 MG CAPS capsule Take 1 capsule (0.4 mg total) by mouth daily. 04/24/24   McGowen, Aleene DEL, MD  torsemide  (DEMADEX ) 20 MG tablet Take 1 tablet (20 mg total) by mouth daily. 07/05/24   Okey Vina GAILS, MD    Allergies: Patient has no known allergies.    Review of Systems  All other systems reviewed and are negative.   Updated Vital Signs BP 101/69   Pulse 64   Temp 97.8 F (36.6 C)   Resp 18   SpO2 100%   Physical Exam Vitals and nursing note reviewed.  Constitutional:      General: He is not in acute distress.    Appearance: Normal appearance.   HENT:     Head: Normocephalic and atraumatic.  Eyes:     General:        Right eye: No discharge.        Left eye: No discharge.  Cardiovascular:     Rate and Rhythm: Normal rate and regular rhythm.     Heart sounds: No murmur heard.    No friction rub. No gallop.  Pulmonary:     Effort: Pulmonary effort is normal.     Breath sounds: Normal breath sounds.     Comments: Increased work of breathing, but no obvious wheezing, rhonchi, stridor, rales Abdominal:     General: Bowel sounds are normal.     Palpations: Abdomen is soft.  Musculoskeletal:     Comments: Significant bilateral lower extremity edema, right greater than left, foul-smelling ulcers noted at foot and toes.  Skin:    General: Skin is warm and dry.     Capillary Refill: Capillary refill takes less than 2 seconds.  Neurological:     Mental Status: He is alert and oriented to person, place, and time.  Psychiatric:        Mood and Affect: Mood normal.        Behavior: Behavior normal.     (all labs ordered are listed, but only abnormal results are displayed) Labs Reviewed  CBC - Abnormal; Notable for the following components:      Result Value   RBC 3.24 (*)    Hemoglobin 11.2 (*)    HCT 34.5 (*)    MCV 106.5 (*)    MCH 34.6 (*)    Platelets 71 (*)    All other components within normal limits  BASIC METABOLIC PANEL WITH GFR - Abnormal; Notable for the following components:   BUN 44 (*)    Creatinine, Ser 1.98 (*)    GFR, Estimated 35 (*)    All other components within normal limits  PRO BRAIN NATRIURETIC PEPTIDE - Abnormal; Notable for the following components:   Pro Brain Natriuretic Peptide 909.0 (*)    All other components within normal limits  CK - Abnormal; Notable for the following components:   Total CK 1,802 (*)    All other components within normal limits  TROPONIN T, HIGH SENSITIVITY - Abnormal; Notable for the following components:   Troponin T High Sensitivity 169 (*)    All other  components within normal limits  CULTURE, BLOOD (ROUTINE X 2)    EKG: EKG Interpretation Date/Time:  Monday July 09 2024 12:24:47 EST Ventricular Rate:  82 PR Interval:  248 QRS Duration:  150 QT Interval:  403 QTC Calculation: 471 R Axis:   19  Text Interpretation: Sinus rhythm Prolonged PR interval 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 CLINICAL HISTORY: Shortness of breath with increased leg swelling and weakness. FINDINGS: LUNGS AND PLEURA: Shallow inspiration. Elevation of the right hemidiaphragm with interposition of bowel contents under the hemidiaphragm. Atelectasis in the right lung base with probable small pleural effusion. No pneumothorax. HEART AND MEDIASTINUM: Heart size is normal given the technique. No acute abnormality of the cardiac and mediastinal silhouettes. BONES AND SOFT TISSUES: Degenerative changes in the spine. IMPRESSION: 1. Atelectasis in the right lung base with probable small pleural effusion. 2. Elevation of the right hemidiaphragm with interposed bowel beneath the hemidiaphragm. Electronically signed by: Elsie Gravely MD 07/09/2024 01:15 PM EST RP Workstation: [id number]     Procedures   Medications Ordered in the ED  furosemide  (LASIX ) injection 40 mg (has no administration in time range)  ceFEPIme  (MAXIPIME ) 2 g in sodium chloride  0.9 % 100 mL IVPB (has no administration in time range)  vancomycin  (VANCOCIN ) IVPB 1000 mg/200 mL premix (has no administration in time range)    Followed by  vancomycin  (VANCOCIN ) IVPB 1000 mg/200 mL premix (has no administration in time range)                                    Medical Decision Making  This patient is a 73 y.o. adult  who presents to the ED for concern of leg swelling, shortness of  breath, orthopnea.   Differential diagnoses prior to evaluation: The emergent differential diagnosis includes, but is not limited to,  asthma exacerbation, COPD exacerbation, acute upper respiratory infection, acute bronchitis, chronic bronchitis, interstitial lung disease, ARDS, PE, pneumonia, atypical ACS, carbon monoxide poisoning, spontaneous pneumothorax, new CHF vs CHF exacerbation, versus other related concerns cellulitis, DVT, lymphedema, versus fluid overload. This is not an exhaustive differential.   Past Medical History / Co-morbidities / Social History: CAD, hypertension, arthritis, DOACs on chronic anticoagulation, he has inclusion body myositis, history of hemidiaphragm paralysis  Additional history: Chart reviewed. Pertinent results include: Reviewed outpatient wound care notes, family medicine visits, no recent emergency department evaluations on file, kidney function 2 months ago was fairly normal, creatinine 0.92, BUN 22  Physical Exam: Physical exam performed. The pertinent findings include: Significant bilateral lower extremity edema, right greater than left, foul-smelling ulcers noted at foot and toes.   Increased work of breathing, but no obvious wheezing, rhonchi, stridor, rales  Afebrile, vital signs overall stable although blood pressure is somewhat soft, 106/80 on arrival, with minimum 105/51.  Soft oxygen saturation on room air, 93%, improved with 2 L nasal cannula.  Lab Tests/Imaging studies: I personally interpreted labs/imaging and the pertinent results include: CBC without leukocytosis, does have some anemia, hemoglobin 1.2, thrombocytopenia platelets 71.  BMP AKI, BUN 44, creatinine 1.98.  His BNP is significantly elevated at 909 and his initial troponin is quite elevated at 169.  Suspect prerenal AKI, fluid overload,.  Plain film chest x-ray with small pleural effusion, hemiparesis of diaphragm. I agree with the radiologist interpretation.  Cardiac  monitoring: EKG obtained and interpreted by myself and attending physician which shows: Normal sinus rhythm, right bundle branch block, fairly stable compared to baseline   Medications: I ordered medication including vancomycin , cefepime  for cellulitis, Lasix  for fluid overload.  I have reviewed the patients home medicines and  have made adjustments as needed.  Consult: Spoke with hospitalist, Dr. Fausto who agrees to admission for AKI, type II NSTEMI from fluid overload, heart failure exacerbation, cellulitis   Disposition: After consideration of the diagnostic results and the patients response to treatment, I feel that patient would benefit from hospital admission as discussed above.   Final diagnoses:  None    ED Discharge Orders     None          Rosan Sherlean DEL, PA-C 07/09/24 1430    Bari Roxie HERO, [place] 07/10/24 [phone]

## 2024-07-09 NOTE — ED Provider Notes (Signed)
°  Physical Exam  BP 116/68 (BP Location: Right Arm)   Pulse 84   Temp 98.1 F (36.7 C) (Axillary)   Resp 19   SpO2 100%   Physical Exam  Procedures  .Critical Care  Performed by: Neldon Hamp RAMAN, PA Authorized by: Neldon Hamp RAMAN, PA   Critical care provider statement:    Critical care time (minutes):  35   Critical care time was exclusive of:  Separately billable procedures and treating other patients and teaching time   Critical care was necessary to treat or prevent imminent or life-threatening deterioration of the following conditions:  Respiratory failure (Worsening somnolence found to be hypercarbic)   Critical care was time spent personally by me on the following activities:  Development of treatment plan with patient or surrogate, review of old charts, re-evaluation of patient's condition, pulse oximetry, ordering and review of radiographic studies, ordering and review of laboratory studies, ordering and performing treatments and interventions, obtaining history from patient or surrogate, examination of patient and evaluation of patient's response to treatment   Care discussed with: admitting provider     ED Course / MDM    Medical Decision Making Amount and/or Complexity of Data Reviewed Labs: ordered. Radiology: ordered. ECG/medicine tests: ordered.  Risk Prescription drug management. Decision regarding hospitalization.   Started on BiPAP  Repeat chest x-ray without any acute abnormal findings compared to prior.  On antibiotics.  Has received Lasix .  Is mentating well on BiPAP, would like to be full code.  I updated Dr. Fausto about his BiPAP status and need for progressive.       Neldon Hamp [place], [place] 07/09/24 1946    Yolande Lamar BROCKS, MD 07/10/24 [phone]

## 2024-07-09 NOTE — ED Triage Notes (Signed)
 Increased leg swelling Sob Unable to lay down Increased weakness Lasix  changed to torsemide  recently  (Friday)without improvement

## 2024-07-09 NOTE — Assessment & Plan Note (Addendum)
 Patient not at baseline, continue very weak and deconditioned.   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  Patient was placed initially on Bipap support at night, will transition to Cpap and follow up response.

## 2024-07-09 NOTE — H&P (Signed)
 Telemedicine History and Physical    Patient: [name] FMW:[id number] DOB: 26-Jan-1951 DOA: 07/09/2024 DOS: the patient was seen and examined on 07/09/2024 PCP: Candise Aleene DEL, MD   Referring Provider: Sherlean Carota, PA Telemedicine Provider: Burnard Cunning, DO Patient Location: [facility] Referring Diagnosis: CHF, RLE cellulitis Patient Name and DOB verified: [name], 03/25/1951 Patient consented to Telemedicine Evaluation: Yes - daughter consented on pt's behalf RN virtual assistant: Lonell Fujisawa, RN Video encounter time and date: 07/19/24 3:10 PM   Patient coming from: Home  Chief Complaint:  Chief Complaint  Patient presents with   Leg Swelling   HPI: [name] is a 73 y.o. 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  Patient very drowsy during virtual admission encounter and also hard of hearing therefore his daughter at bedside assisted with providing history.  Patient had been seen by cardiology on Thursday (12/11) due to his worsening leg swelling.  His oral diuretic regimen was increased but apparently without improvement, swelling continue to worsen and patient began complaining of more shortness of breath, dyspnea on exertion and orthopnea.  No recent fevers or chills, sore throat cough or congestion.  Daughter reports patient has been saying that his water pills are not working as well as they used to, perhaps indicating decreased urine output although not clear.  Patient has not complained of chest pain but is noticeably dyspneic with exertion.  At baseline he is in his wheelchair most of the time, dozing on and off throughout the day and is deconditioned  but able to transfer himself independently.  This morning, patient complained of wobbly legs and noticeably weaker than baseline.  Daughter reports patient has been followed at wound care clinic for 3 years for chronic wounds.  He has had increasing swelling in his legs for about the past month, to the point of now needing help getting his legs into the car when going to appointments which is new.  Over this past weekend, daughter reports the right leg blew up and has developed some new wounds above the chronic ones that he has had due to the intensity of swelling, now with weeping fluid.  The right leg has also become red and warm since developing new areas of skin breakdown.  Patient has otherwise not complained of chest pain, palpitations, dizziness/lightheadedness, abdominal pain, nausea vomiting diarrhea or constipation or other recent symptoms other than above.  ED course: Initial vitals -temp 97.8 F, HR 78, RR 18, BP 106/80, SpO2 93-100% on room air. Labs obtained including BMP and CBC were notable for BUN of 44, creatinine 1.98 (up from 0.92 on 05/09/2024), hemoglobin 11.2, platelets 71. proBNP 909.0.  CK elevated 1802, troponin mildly elevated 169 >> 154 EKG showed normal sinus rhythm 82 bpm, right bundle branch block, no acute ischemic changes. Imaging -- Portable chest x-ray showed atelectasis of the right lung base with probable small pleural effusion, elevation of the right hemidiaphragm with superimposed bowel beneath the hemidiaphragm. --Right lower extremity venous Doppler ultrasound showed no DVT  Patient was treated in the ED with 40 mg IV Lasix , IV vancomycin  and cefepime .  Patient is being admitted to the hospital for further evaluation and management of apparent acute on chronic HFpEF and right lower extremity cellulitis as  outlined in detail below.   Review of Systems: As mentioned in the history of present illness. All other systems reviewed and are negative.   Past  Medical History:  Diagnosis Date   Adenomatous colon polyp - diminutive 06/2019   no recall needed   Allergy sinus   hay fever   Bilateral lower extremity edema    BPH with lower urinary tract symptoms without urinary obstruction    Cataract    Coronary artery disease    a. s/p BMS to LAD (1998) b. s/p DES to RCA (06/24/14)   DVT (deep venous thrombosis) (HCC)    12/2020 L femoral and poplitea.  Unprovoked.  hypercoag w/u NEG.   Eliquis .   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 ?inclusion body myositis? Power WC as of 2022   Macrocytic anemia    Unknown etiology (bone marrow biopsy benign 2025)   Osteoarthritis, multiple sites    Palatal fistula    Palpable abd. aorta    Aortic u/s NO ANEURISM 2020   RBBB    Spinal stenosis    Dr Royal Bury  surgery   Thrombocytopenia    Uknown etiology (Bone marrow biopsy benign 2025)   Past Surgical History:  Procedure Laterality Date   APPENDECTOMY  2010   BONE MARROW BIOPSY     2025 benign   CERVICAL FUSION  09/2009   CHOLECYSTECTOMY  1998   COLONOSCOPY     2020-->no recall   EYE SURGERY     Lasik   HYDROCELE EXCISION  2005   JOINT REPLACEMENT  05/22/20   right hip   LEFT HEART CATHETERIZATION WITH CORONARY ANGIOGRAM N/A 06/24/2014   Procedure: LEFT HEART CATHETERIZATION WITH CORONARY ANGIOGRAM;  Surgeon: Debby DELENA Sor, MD;  Location[facility] CATH LAB;  Service: Cardiovascular;  Laterality: N/A;   LUMBAR SPINE SURGERY  11/2011   MUSCLE BIOPSY Left 07/11/2018   Procedure: Left Vastus lateralus muscle biopsy;  Surgeon: Cheryle Debby DELENA, MD;  Location: [facility] OR;  Service: Neurosurgery;  Laterality: Left;  Left Vastus lateralus muscle biopsy   PERCUTANEOUS  CORONARY STENT INTERVENTION (PCI-S)  06/24/2014   DES to RCA. Procedure: PERCUTANEOUS CORONARY STENT INTERVENTION (PCI-S);  Surgeon: Debby DELENA Sor, MD;  Location: [facility] CATH LAB;  Service: Cardiovascular;;  Distal RCA   Rhythm monitoring     04/2023, occ pvc's   SPINE SURGERY     TONSILLECTOMY AND ADENOIDECTOMY  2012   TOTAL HIP ARTHROPLASTY Right 2021   RIGHT   TRANSTHORACIC ECHOCARDIOGRAM     03/2019 normal.  Nov 2024 mild RV dysf, o/w normal   Social History:  reports that he quit smoking about 34 years ago. His smoking use included cigarettes. He started smoking about 57 years ago. He has a 17.3 pack-year smoking history. He has never used smokeless tobacco. He reports current alcohol use. He reports that he does not use drugs.  Allergies[1]  Family History  Problem Relation Age of Onset   Heart attack Mother    Esophageal cancer Mother    Cancer Mother    Aneurysm Father    Heart disease Father  Early death Father    Cystic fibrosis Brother        no prior health issues, rare form of cystic fibrosis of the lungs 03-29-24 07/30/15)   Early death Brother    Stroke Neg Hx     Prior to Admission medications  Medication Sig Start Date End Date Taking? Authorizing Provider  acetaminophen  (TYLENOL ) 500 MG tablet Take by mouth as needed for moderate pain or mild pain.    [provider]  apixaban  (ELIQUIS ) 2.5 MG TABS tablet Take 1 tablet (2.5 mg total) by mouth 2 (two) times daily. 07/04/23   Okey Vina GAILS, MD  atenolol  (TENORMIN ) 25 MG tablet Take 1 tablet (25 mg total) by mouth 2 (two) times daily. 05/09/24   Okey Vina GAILS, MD  b complex vitamins tablet Take 1 tablet by mouth daily.    [provider]  cholecalciferol (VITAMIN D) 1000 units tablet Take 1,000 Units by mouth daily.    [provider]  clotrimazole-betamethasone (LOTRISONE) cream Apply 1 Application topically as needed. 12/23/21   [provider]  Coenzyme Q10 (CO Q 10) 100 MG CAPS Take 100  mg by mouth daily.     [provider]  eszopiclone  3 MG TABS Take 1 tablet (3 mg total) by mouth at bedtime. Take immediately before bedtime Patient not taking: Reported on 07/05/2024 04/24/24   McGowen, Philip H, MD  ezetimibe  (ZETIA ) 10 MG tablet Take 1 tablet (10 mg total) by mouth daily. 05/21/24   Lucien Orren SAILOR, PA-C  fluticasone  (FLONASE ) 50 MCG/ACT nasal spray Place 2 sprays into both nostrils 2 (two) times daily. 04/19/24   Soldatova, Liuba, MD  folic acid  (FOLVITE ) 800 MCG tablet Take 800 mcg by mouth daily.     [provider]  glucosamine-chondroitin 500-400 MG tablet Take 2 tablets by mouth daily.     [provider]  ibuprofen (ADVIL,MOTRIN) 800 MG tablet Take 800 mg by mouth daily.     [provider]  Anselm Oil 500 MG CAPS Take 500 mg by mouth daily.     [provider]  levocetirizine (XYZAL  ALLERGY 24HR) 5 MG tablet Take 1 tablet (5 mg total) by mouth every evening. 04/19/24   Soldatova, Liuba, MD  lisinopril  (ZESTRIL ) 10 MG tablet Take 1 tablet (10 mg total) by mouth daily. 05/21/24   Lucien Orren SAILOR, PA-C  potassium chloride  (KLOR-CON ) 10 MEQ tablet Increase dose to 2 tablets by mouth daily for 3 days then 1 tablet daily for 1 week then resume taking 1 tablet (10 mEq total) by mouth every other day. 04/16/24   Lucien Orren SAILOR, PA-C  rosuvastatin  (CRESTOR ) 5 MG tablet Take 1 tablet (5 mg total) by mouth daily. 06/18/24   Okey Vina GAILS, MD  Saw Palmetto, Serenoa repens, (SAW PALMETTO PO) Take 300 mg by mouth daily. Patient not taking: Reported on 07/05/2024    [provider]  tamsulosin  (FLOMAX ) 0.4 MG CAPS capsule Take 1 capsule (0.4 mg total) by mouth daily. 04/24/24   McGowen, Aleene DEL, MD  torsemide  (DEMADEX ) 20 MG tablet Take 1 tablet (20 mg total) by mouth daily. 07/05/24   Okey Vina GAILS, MD    Physical Exam: Vitals:   07/09/24 1230 07/09/24 1330 07/09/24 1709 07/09/24 1712  BP: (!) 105/51 101/69  116/68  Pulse: 67 64  84   Resp: (!) 22 18  19   Temp:   98.1 F (36.7 C)   TempSrc:   Axillary   SpO2: 100%  100%  100%   Bedside physical exam was performed by RN listed above. Below exam findings are based on their in person physical exam findings and my observations during virtual encounter.  General exam: awake, very drowsy appearing, no acute distress, obese HEENT: Hard of hearing Respiratory system: Lungs clear but diminished in the bilateral bases, no wheezes or rhonchi, normal respiratory effort at rest, on room air. Cardiovascular system: normal S1/S2, RRR, no murmurs heard   Gastrointestinal system: soft, NT, ND, +bowel sounds. Central nervous system: Exam limited by patient hearing deficits and drowsiness but overall no gross focal neurologic deficits, normal speech Extremities: +3-4 right lower extremity edema, brace on the left lower extremity Skin: Scattered patchy ecchymosis on bilateral upper extremities, right lower extremity weeping and skin breakdown with erythema, nontender on palpation Psychiatry: normal mood, congruent affect, judgement and insight appear normal    Data Reviewed:  Labs and diagnostic studies as reviewed above in detail  Assessment and Plan:  * Acute on chronic heart failure with preserved ejection fraction (HFpEF) (HCC) Patient presenting with progressively worsening swelling, dyspnea worse on exertion, orthopnea with progressive symptoms despite increase in oral diuretics just 4 days ago as outpatient with cardiology.   Last echo in November 2024 was poor acoustic windows and limited but showed normal EF 55 to 60%, no LV regional WMA's, mild LVH, mild MR. Given 40 mg IV Lasix  in the ED. -- Admit to telemetry bed for further evaluation --Cardiology consulted, appreciate recommendations --Will hold further diuresis for now, given AKI and assess response to Lasix  given in the ED first --Strict I/O's and daily weights --Monitor renal function and electrolytes --Repeat  echo ordered  AKI (acute kidney injury) Creatinine on admission 1.89, up from 0.92 in mid October.  Patient given 40 mg IV Lasix  in the ED due to signs of volume overload as outlined.   --Hold further diuresis for now --Hold off fluids given patient clinically volume overloaded --Check renal ultrasound --Avoid other nephrotoxins --Renally dose meds --Monitor BMP  Cellulitis of right lower extremity Right lower extremity erythema and new skin breakdown in the setting of edema with weeping, consistent with cellulitis.  DVT was ruled out on ultrasound in the ED.  Treated with vancomycin  and cefepime  in the ED --Continue IV antibiotics with Ancef  --Follow blood cultures --Wound care consulted  Elevated CK Etiology unclear, CK elevated at 1802 on admission.  No reported trauma history or prolonged downtime, but patient is quite sedentary at baseline.  He had a normal CK level 1 year ago at 182.  Does have baseline inclusion body myositis. Currently with AKI, creatinine 1.98 --Add on hepatic function panel --Check urine myoglobin --Holding further diuresis as above, but will also hold off fluids given the severity of his edema and dyspneic symptoms consistent with fluid overload. --Trend CK and further evaluation indicated  Bilateral lower extremity edema Chronic venous insufficiency Chronic lower extremity wounds -followed by wound care clinic long-term Now with new wounds/skin breakdown related to his severe edema with weeping. --Holding further diuresis as above -- Wound care consulted  HTN (hypertension) BP stable and mildly soft in the ED. Hold home antihypertensives for now, resume meds as BP tolerates.  Med history pending.  Coronary artery disease Elevated troponin -mild, and trended down, 169>> 15.  Patient has no complaints of chest pain and EKG showed no acute ischemic changes. --Resume home meds pending med history verification  History of DVT (deep vein thrombosis) On  Eliquis  2.5 mg twice daily  and daughter reports good compliance with this. Right lower extremity venous Doppler ultrasound was negative for DVT in the ED. --Resume Eliquis   Chronic radicular lumbar pain Resume home medications pending med history verification  Weakness PT and OT evaluations Fall precautions  Hyperlipidemia Resume meds pending med history verification  Myositis Baseline inclusion body myositis which may explain his CK elevation. Baseline generalized weakness and relying on wheelchair but can independently transfer. -- Supportive care and close monitoring --Trend CK      Advance Care Planning: CODE STATUS-full code Given patient's hearing deficit, his daughter relayed questions about CODE STATUS to him and I could hear patient verbalized his answers that yes he would want to be resuscitated and undergo CPR, defibrillation and intubation with mechanical ventilation in the event of cardiac or respiratory arrest.  Consults: Cardiology  Family Communication: Daughter and her husband were present at bedside during virtual encounter  Severity of Illness: The appropriate patient status for this patient is INPATIENT. Inpatient status is judged to be reasonable and necessary in order to provide the required intensity of service to ensure the patient's safety. The patient's presenting symptoms, physical exam findings, and initial radiographic and laboratory data in the context of their chronic comorbidities is felt to place them at high risk for further clinical deterioration. Furthermore, it is not anticipated that the patient will be medically stable for discharge from the hospital within 2 midnights of admission.   * I certify that at the point of admission it is my clinical judgment that the patient will require inpatient hospital care spanning beyond 2 midnights from the point of admission due to high intensity of service, high risk for further deterioration and high  frequency of surveillance required.*  Author: Burnard DELENA Cunning, DO 07/09/2024 5:43 PM  For on call review [email or web address].      [1] No Known Allergies

## 2024-07-10 ENCOUNTER — Observation Stay (HOSPITAL_COMMUNITY)

## 2024-07-10 DIAGNOSIS — I5033 Acute on chronic diastolic (congestive) heart failure: Secondary | ICD-10-CM | POA: Diagnosis not present

## 2024-07-10 DIAGNOSIS — I5031 Acute diastolic (congestive) heart failure: Secondary | ICD-10-CM | POA: Diagnosis not present

## 2024-07-10 LAB — I-STAT VENOUS BLOOD GAS, ED
Acid-Base Excess: 5 mmol/L — ABNORMAL HIGH (ref 0.0–2.0)
Bicarbonate: 31.5 mmol/L — ABNORMAL HIGH (ref 20.0–28.0)
Calcium, Ion: 1.21 mmol/L (ref 1.15–1.40)
HCT: 33 % — ABNORMAL LOW (ref 39.0–52.0)
Hemoglobin: 11.2 g/dL — ABNORMAL LOW (ref 13.0–17.0)
O2 Saturation: 69 %
Potassium: 4.3 mmol/L (ref 3.5–5.1)
Sodium: 139 mmol/L (ref 135–145)
TCO2: 33 mmol/L — ABNORMAL HIGH (ref 22–32)
pCO2, Ven: 57.2 mmHg (ref 44–60)
pH, Ven: 7.349 (ref 7.25–7.43)
pO2, Ven: 39 mmHg (ref 32–45)

## 2024-07-10 LAB — BASIC METABOLIC PANEL WITH GFR
Anion gap: 9 (ref 5–15)
BUN: 41 mg/dL — ABNORMAL HIGH (ref 8–23)
CO2: 31 mmol/L (ref 22–32)
Calcium: 9.3 mg/dL (ref 8.9–10.3)
Chloride: 101 mmol/L (ref 98–111)
Creatinine, Ser: 1.48 mg/dL — ABNORMAL HIGH (ref 0.61–1.24)
GFR, Estimated: 49 mL/min — ABNORMAL LOW (ref >60)
Glucose, Bld: 118 mg/dL — ABNORMAL HIGH (ref 70–99)
Potassium: 4.5 mmol/L (ref 3.5–5.1)
Sodium: 142 mmol/L (ref 135–145)

## 2024-07-10 LAB — ECHOCARDIOGRAM COMPLETE
Height: 70 in
S' Lateral: 3.3 cm
Weight: 4800 [oz_av]

## 2024-07-10 LAB — CK: Total CK: 943 U/L — ABNORMAL HIGH (ref 49–397)

## 2024-07-10 MED ORDER — ATENOLOL 25 MG PO TABS: 25.0000 mg | ORAL_TABLET | Freq: Two times a day (BID) | ORAL | Status: DC

## 2024-07-10 MED ORDER — FUROSEMIDE 10 MG/ML IJ SOLN
40.0000 mg | Freq: Two times a day (BID) | INTRAMUSCULAR | Status: DC
  Administered 2024-07-10 – 2024-07-11 (×3): 40 mg via INTRAVENOUS
  Filled 2024-07-10 (×3): qty 4

## 2024-07-10 MED ORDER — DIPHENHYDRAMINE HCL 25 MG PO CAPS
25.0000 mg | ORAL_CAPSULE | Freq: Every evening | ORAL | Status: DC | PRN
  Administered 2024-07-10 – 2024-07-11 (×2): 25 mg via ORAL
  Filled 2024-07-10 (×2): qty 1

## 2024-07-10 MED ORDER — ONDANSETRON HCL 4 MG/2ML IJ SOLN: 4.0000 mg | Freq: Four times a day (QID) | INTRAMUSCULAR | Status: DC | PRN

## 2024-07-10 MED ORDER — OXYCODONE HCL 5 MG PO TABS
5.0000 mg | ORAL_TABLET | ORAL | Status: DC | PRN
  Administered 2024-07-11 – 2024-07-17 (×5): 5 mg via ORAL
  Filled 2024-07-10 (×5): qty 1

## 2024-07-10 MED ORDER — LEVOCETIRIZINE DIHYDROCHLORIDE 5 MG PO TABS: 5.0000 mg | ORAL_TABLET | Freq: Every evening | ORAL | Status: DC

## 2024-07-10 MED ORDER — FUROSEMIDE 10 MG/ML IJ SOLN
40.0000 mg | Freq: Once | INTRAMUSCULAR | Status: AC
  Administered 2024-07-10: 21:00:00 40 mg via INTRAVENOUS
  Filled 2024-07-10: qty 4

## 2024-07-10 MED ORDER — ONDANSETRON HCL 4 MG PO TABS: 4.0000 mg | ORAL_TABLET | Freq: Four times a day (QID) | ORAL | Status: DC | PRN

## 2024-07-10 MED ORDER — MELATONIN 5 MG PO TABS
10.0000 mg | ORAL_TABLET | Freq: Every day | ORAL | Status: DC
  Administered 2024-07-10 – 2024-07-19 (×10): 10 mg via ORAL
  Filled 2024-07-10 (×10): qty 2

## 2024-07-10 MED ORDER — BISACODYL 5 MG PO TBEC: 5.0000 mg | DELAYED_RELEASE_TABLET | Freq: Every day | ORAL | Status: DC | PRN

## 2024-07-10 MED ORDER — SENNOSIDES-DOCUSATE SODIUM 8.6-50 MG PO TABS: 1.0000 | ORAL_TABLET | Freq: Every evening | ORAL | Status: DC | PRN

## 2024-07-10 MED ORDER — FOLIC ACID 1 MG PO TABS
1.0000 mg | ORAL_TABLET | Freq: Every day | ORAL | Status: DC
  Administered 2024-07-10 – 2024-07-18 (×9): 1 mg via ORAL
  Filled 2024-07-10 (×9): qty 1

## 2024-07-10 MED ORDER — FOLIC ACID 800 MCG PO TABS: 800.0000 ug | ORAL_TABLET | Freq: Every day | ORAL | Status: DC

## 2024-07-10 MED ORDER — ACETAMINOPHEN 325 MG PO TABS: 650.0000 mg | ORAL_TABLET | Freq: Four times a day (QID) | ORAL | Status: DC | PRN

## 2024-07-10 MED ORDER — TAMSULOSIN HCL 0.4 MG PO CAPS
0.4000 mg | ORAL_CAPSULE | Freq: Every day | ORAL | Status: DC
  Administered 2024-07-10 – 2024-07-20 (×11): 0.4 mg via ORAL
  Filled 2024-07-10 (×11): qty 1

## 2024-07-10 MED ORDER — ENOXAPARIN SODIUM 40 MG/0.4ML IJ SOSY: 40.0000 mg | PREFILLED_SYRINGE | INTRAMUSCULAR | Status: DC

## 2024-07-10 MED ORDER — GERHARDT'S BUTT CREAM
TOPICAL_CREAM | Freq: Every day | CUTANEOUS | Status: DC
  Filled 2024-07-10 (×2): qty 60

## 2024-07-10 MED ORDER — ACETAMINOPHEN 650 MG RE SUPP: 650.0000 mg | Freq: Four times a day (QID) | RECTAL | Status: DC | PRN

## 2024-07-10 MED ORDER — SODIUM CHLORIDE 0.9 % IV SOLN: INTRAVENOUS | Status: AC | PRN

## 2024-07-10 NOTE — Progress Notes (Signed)
 RT arrived bedside once patient arrived to 6E03 via [facility]. [facility] took patient off bipap to move patient to new bed. Patient talking and sating 95% on RA. RT and RN agreed patient is not in any respiratory distress. Patient tolerating RA well. Bipap is currently bedside on stand by. RT will continue to monitor as needed.

## 2024-07-10 NOTE — Consult Note (Addendum)
 As below, patient seen and examined.  Briefly he is a 73 year old male with past medical history of coronary artery disease, hypertension, DVT, inclusion body myositis, BPH, lymphedema for evaluation of acute CHF.  Last echocardiogram November 2024 showed normal LV function, mild RV dysfunction, mild mitral regurgitation. Pt has chronic bilateral lower extremity edema right greater than left.  However since August this has progressed.  He also notes worsening dyspnea on exertion but no orthopnea or PND.  He has gained approximately 14 to 15 pounds.  He denies chest pain or syncope.  Also complains of some degree of weakness.  Presented to the emergency room and was admitted and cardiology now asked to evaluate.  Renal ultrasound shows right sided hydronephrosis and incompletely characterized mass in the bladder.  Chest x-ray shows elevated right hemidiaphragm which is chronic.  Creatinine 1.48, CK total 943, hemoglobin 11.2, albumin 3.7, troponin T 154.  Electrocardiogram shows sinus rhythm, first-degree AV block and right bundle branch block.  1 acute diastolic CHF-patient has chronic bilateral lower extremity edema right greater than left which has progressed.  Clearly a component of lymphedema.  However on examination he also has presacral edema; proBNP mildly elevated at 909.  Renal function worse than baseline.  Will continue Lasix  40 mg twice daily for now.  Follow renal function closely.  May need to decrease dose.  Await echocardiogram.  He is obese and there may be a component of right heart failure.  2 acute kidney injury-renal function is worse than baseline.  Follow closely with diuresis.  Note CK is elevated; question if this is contributing.  3 history of coronary artery disease-agree with holding statin in the setting of elevated CK.  Not on aspirin  given need for chronic anticoagulation.  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  Will continue.  5  cellulitis-antibiotics per primary service.  Redell Shallow, MD  Cardiology Consultation   Patient ID: [name] MRN: [id number]; DOB: 1951/04/03  Admit date: 07/09/2024 Date of Consult: 07/10/2024  PCP:  Candise Aleene DEL, MD   [name] Cardiologist:  [name] Gull, MD        Patient Profile: [name] is a 73 y.o. 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  History of Present Illness: [name] underwent PTCA/BMS to LAD in [place]  in 1998. He did have a LHC in 2015 that showed patent stent, normal Lcx, and RCA with 99% stenosis that underwent successful RCA stenting. On chart review patient has been noticing increase in peripheral edema since 03/2024. He was last seen by Dr. Gull 07/05/24. At that visit patient was volume overload, though stable. His diuresis was switched from lasix  to torsemide .   Presented to [facility] on 12/15 for SOB, inability to lay flat, peripheral edema, and weakness. In the ED: BP:105/51 ECG: Sinus rhythm with 1st Degree AV block, RBBB VR 82 CXR showed right pleural effusion with associated atelectasis with repeat now showing concern for possible PNA US  DVT negative for left leg though order for right leg US  Renal: right sided hydronephrosis and lesion to anterior bladder  Pertinent lab work: Hgb ~ 11.5 [at baseline] Cr 1.98 -> 1.48 [Baseline 0.9] AST 104 Pro BNP 909 CK 1802 -> 943 Venous pCO2 ~74 Troponin 169 -> 154  He has  received a breathing treatment, and one dose of IV lasix . He is currently on IV antibiotics.  Net IO Since Admission: -1,350 mL [07/10/24 0952]  On interview, patient shared his primary reason for coming with his peripheral edema, in particular to his right leg. Shared he did not notice  increase urination with torsemide .  Reported orthopnea when he first lays down, but if he lays down long enough it goes away.  Has been sleeping in his chair at home recently to help with his leg elevation, though noted he has not been sleeping well 2/2 chronic insomnia.  Denied change in appetite. Denied recent illness.  Denied lightheadedness, dizziness, or chest pain.  Denied OSA or snoring.   Chart review: Daughter commented on patient mentioned DOE, orthopnea, and decreased mentation/increased sleepiness [ dozing off during the day]. Daughter mentioned peripheral edema has worsened resulting in difficulty getting patient into car [patient also mentioned this].  Past Medical History:  Diagnosis Date   Adenomatous colon polyp - diminutive 06/2019   no recall needed   Allergy sinus   hay fever   Bilateral lower extremity edema    BPH with lower urinary tract symptoms without urinary obstruction    Cataract    Coronary artery disease    a. s/p BMS to LAD (1998) b. s/p DES to RCA (06/24/14)   DVT (deep venous thrombosis) (HCC)    12/2020 L femoral and poplitea.  Unprovoked.  hypercoag w/u NEG.   Eliquis .   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 ?inclusion body myositis? Power WC as of 2022   Macrocytic anemia    Unknown etiology (bone marrow biopsy benign 2025)   Osteoarthritis, multiple sites    Palatal fistula    Palpable abd. aorta    Aortic u/s NO ANEURISM 2020   RBBB    Spinal stenosis    Dr Royal Bury  surgery   Thrombocytopenia    Uknown etiology (Bone marrow biopsy benign 2025)    Past Surgical History:  Procedure Laterality Date   APPENDECTOMY  2010   BONE MARROW BIOPSY     2025 benign    CERVICAL FUSION  09/2009   CHOLECYSTECTOMY  1998   COLONOSCOPY     2020-->no recall   EYE SURGERY     Lasik   HYDROCELE EXCISION  2005   JOINT REPLACEMENT  05/22/20   right hip   LEFT HEART CATHETERIZATION WITH CORONARY ANGIOGRAM N/A 06/24/2014   Procedure: LEFT HEART CATHETERIZATION WITH CORONARY ANGIOGRAM;  Surgeon: Debby DELENA Sor, MD;  Location: [facility] CATH LAB;  Service: Cardiovascular;  Laterality: N/A;   LUMBAR SPINE SURGERY  11/2011   MUSCLE BIOPSY Left 07/11/2018   Procedure: Left Vastus lateralus muscle biopsy;  Surgeon: Cheryle Debby DELENA, MD;  Location: [facility] OR;  Service: Neurosurgery;  Laterality: Left;  Left Vastus lateralus muscle biopsy   PERCUTANEOUS CORONARY STENT INTERVENTION (PCI-S)  06/24/2014   DES to RCA. Procedure: PERCUTANEOUS CORONARY STENT INTERVENTION (PCI-S);  Surgeon: Debby DELENA Sor, MD;  Location: [facility] CATH LAB;  Service: Cardiovascular;;  Distal RCA   Rhythm monitoring     04/2023, occ pvc's  SPINE SURGERY     TONSILLECTOMY AND ADENOIDECTOMY  2012   TOTAL HIP ARTHROPLASTY Right 2021   RIGHT   TRANSTHORACIC ECHOCARDIOGRAM     03/2019 normal.  Nov 2024 mild RV dysf, o/w normal       Scheduled Meds:  apixaban   2.5 mg Oral BID   Continuous Infusions:   ceFAZolin  (ANCEF ) IV Stopped (07/10/24 0545)   PRN Meds: acetaminophen  **OR** acetaminophen , bisacodyl , ondansetron  **OR** ondansetron  (ZOFRAN ) IV, oxyCODONE , senna-docusate  Allergies:   Allergies[1]  Social History:   Social History   Socioeconomic History   Marital status: Married    Spouse name: Not on file   Number of children: Not on file   Years of education: Not on file   Highest education level: Never attended school  Occupational History   Not on file  Tobacco Use   Smoking status: Former    Current packs/day: 0.00    Average packs/day: 0.8 packs/day for 23.0 years (17.3 ttl pk-yrs)    Types: Cigarettes    Start date: 07/26/1966    Quit date: 07/26/1989    Years since quitting: 34.9    Smokeless tobacco: Never  Vaping Use   Vaping status: Never Used  Substance and Sexual Activity   Alcohol use: Yes    Comment: 3-6 cans per week   Drug use: No   Sexual activity: Yes    Partners: Female    Birth control/protection: None    Comment: married  Other Topics Concern   Not on file  Social History Narrative   Married, 2 kids (one is teacher, early years/pre at [employer]).   Educ: HS   Retired dance movement psychotherapist   Patient is a former smoker, quit age 5   Alcohol use - yes, hx beer drinker, has approx 6 pack per week   Drug Use- no    caffeine 3 cups coffee daily   Enjoys motorcycles   Social Drivers of Health   Tobacco Use: Medium Risk (07/09/2024)   Patient History    Smoking Tobacco Use: Former    Smokeless Tobacco Use: Never    Passive Exposure: Not on file  Financial Resource Strain: Low Risk (06/05/2024)   Overall Financial Resource Strain (CARDIA)    Difficulty of Paying Living Expenses: Not hard at all  Food Insecurity: No Food Insecurity (06/05/2024)   Epic    Worried About Programme Researcher, Broadcasting/film/video in the Last Year: Never true    Ran Out of Food in the Last Year: Never true  Transportation Needs: No Transportation Needs (06/05/2024)   Epic    Lack of Transportation (Medical): No    Lack of Transportation (Non-Medical): No  Physical Activity: Inactive (06/05/2024)   Exercise Vital Sign    Days of Exercise per Week: 0 days    Minutes of Exercise per Session: Not on file  Stress: No Stress Concern Present (06/05/2024)   [employer] of Occupational Health - Occupational Stress Questionnaire    Feeling of Stress: Not at all  Social Connections: Socially Isolated (06/05/2024)   Social Connection and Isolation Panel    Frequency of Communication with Friends and Family: Once a week    Frequency of Social Gatherings with Friends and Family: Once a week    Attends Religious Services: Never    Database Administrator or Organizations: No    Attends Tax Inspector Meetings: Not on file    Marital Status: Married  Intimate Partner Violence: Not At Risk (12/28/2023)   Humiliation, Afraid,  Rape, and Kick questionnaire    Fear of Current or Ex-Partner: No    Emotionally Abused: No    Physically Abused: No    Sexually Abused: No  Depression (PHQ2-9): Low Risk (12/28/2023)   Depression (PHQ2-9)    PHQ-2 Score: 4  Alcohol Screen: Low Risk (06/05/2024)   Alcohol Screen    Last Alcohol Screening Score (AUDIT): 4  Housing: Unknown (06/05/2024)   Epic    Unable to Pay for Housing in the Last Year: No    Number of Times Moved in the Last Year: Not on file    Homeless in the Last Year: No  Utilities: Not At Risk (12/28/2023)   [facility] Utilities    Threatened with loss of utilities: No  Health Literacy: Adequate Health Literacy (12/28/2023)   B1300 Health Literacy    Frequency of need for help with medical instructions: Never    Family History:   Family History  Problem Relation Age of Onset   Heart attack Mother    Esophageal cancer Mother    Cancer Mother    Aneurysm Father    Heart disease Father    Early death Father    Cystic fibrosis Brother        no prior health issues, rare form of cystic fibrosis of the lungs (aug Jul 18, 2015)   Early death Brother    Stroke Neg Hx      ROS:  Please see the history of present illness.  All other ROS reviewed and negative.     Physical Exam/Data: Vitals:   07/10/24 0700 07/10/24 0730 07/10/24 0745 07/10/24 0845  BP: (!) 127/55 (!) 105/55    Pulse: 72 88 80 83  Resp: 20 18 20 18   Temp:      TempSrc:      SpO2: 100% 100% 100% 96%    Intake/Output Summary (Last 24 hours) at 07/10/2024 0932 Last data filed at 07/10/2024 0641 Gross per 24 hour  Intake 600 ml  Output 1950 ml  Net -1350 ml      07/05/2024    2:58 PM 06/06/2024   10:49 AM 05/09/2024   11:37 AM  Last 3 Weights  Weight (lbs) 302 lb 299 lb 12.8 oz 300 lb 4.8 oz  Weight (kg) 136.986 kg 135.988 kg 136.215 kg     There is no  height or weight on file to calculate BMI.  General:  Older gentlemen in no acute distress.  HEENT: Hard of hearing Neck: JVD Vascular: Distal pulses 1+ bilaterally Cardiac:  normal S1, S2; RRR; no murmur  Lungs:  clear to auscultation bilaterally, no wheezing, rhonchi or rales  Abd: soft, nontender, no hepatomegaly  Ext: bilateral edema [trace pitting through stockings] with compression stockings/dressings in place. Above stockings thicken skin.  Skin: warm feet, cool hands, dry skin Psych:  Normal affect   EKG:  The EKG was personally reviewed and demonstrates:  See hpi Telemetry:  Telemetry was personally reviewed and demonstrates:  sinus rhythm, occasional PVC, HR ~90 RBBB  Relevant CV Studies: Echocardiogram 05/30/23 IMPRESSIONS     1. Poor acoustic windows limit study No subcostal views.   2. Left ventricular ejection fraction, by estimation, is 55 to 60%. The  left ventricle has normal function. The left ventricle has no regional  wall motion abnormalities. There is mild left ventricular hypertrophy.   3. Right ventricular systolic function is mildly reduced. The right  ventricular size is normal.   4. Mild mitral valve regurgitation.   5. The  aortic valve is tricuspid. Aortic valve regurgitation is trivial   Heart monitor 04/2023 Rhythm:  Sinus rhythm  Rates 44 to 102 bpm  Average HR 69 bpm Rare PACs, frequent PVCs (6% total)  No triggered events     Patient had a min HR of 44 bpm, max HR of 102 bpm, and avg HR of 69 bpm. Predominant underlying rhythm was Sinus Rhythm. First Degree AV Block was present. Bundle Branch Block/IVCD was present. Isolated SVEs were rare (<1.0%), and no SVE Couplets or SVE  Triplets were present. Isolated VEs were frequent (6.0%, 37165), VE Couplets were rare (<1.0%, 166), and VE Triplets were rare (<1.0%, 3). Ventricular Bigeminy and Trigeminy were present.  Laboratory Data: High Sensitivity Troponin:   Recent Labs  Lab 07/09/24 1230  07/09/24 1500  TRNPT 169* 154*      Chemistry Recent Labs  Lab 07/09/24 1230 07/09/24 1833 07/09/24 2041 07/10/24 0325 07/10/24 0514  NA 140   < > 139 139 142  K 4.8   < > 4.4 4.3 4.5  CL 101  --   --   --  101  CO2 29  --   --   --  31  GLUCOSE 95  --   --   --  118*  BUN 44*  --   --   --  41*  CREATININE 1.98*  --   --   --  1.48*  CALCIUM  9.2  --   --   --  9.3  GFRNONAA 35*  --   --   --  49*  ANIONGAP 10  --   --   --  9   < > = values in this interval not displayed.    Recent Labs  Lab 07/09/24 1500  PROT 6.6  ALBUMIN 3.7  AST 104*  ALT 35  ALKPHOS 77  BILITOT 0.6   Hematology Recent Labs  Lab 07/09/24 1230 07/09/24 1833 07/09/24 1936 07/09/24 2041 07/10/24 0325  WBC 6.3  --   --   --   --   RBC 3.24*  --   --   --   --   HGB 11.2*   < > 11.6* 11.6* 11.2*  HCT 34.5*   < > 34.0* 34.0* 33.0*  MCV 106.5*  --   --   --   --   MCH 34.6*  --   --   --   --   MCHC 32.5  --   --   --   --   RDW 13.9  --   --   --   --   PLT 71*  --   --   --   --    < > = values in this interval not displayed.    BNP Recent Labs  Lab 07/09/24 1230  PROBNP 909.0*     Radiology/Studies:  US  RENAL Result Date: 07/09/2024 EXAM: US  Retroperitoneum Complete, Renal. 07/09/2024 08:45:45 PM TECHNIQUE: Real-time ultrasonography of the kidneys was performed. COMPARISON: None available CLINICAL HISTORY: [id number] AKI (acute kidney injury) 409830 AKI (acute kidney injury) FINDINGS: FINDINGS: RIGHT KIDNEY/URETER: Right kidney measures 12.0 x 6.3 x 3.6 cm. Calculated volume is 142 ml. New right-sided hydronephrosis is noted of a moderate degree. Normal cortical echogenicity. No calculus. No mass. LEFT KIDNEY/URETER: Left kidney measures 11.1 x 5.9 x 4.3 cm. The calculated volume is 147 ml. Normal cortical echogenicity. No hydronephrosis. No calculus. No mass. BLADDER: There is suggestion of an echogenic lesion in the  anterior aspect of the bladder although incompletely evaluated on this  exam. CT of the abdomen and pelvis may be helpful for further evaluation with delayed images through the bladder to assess for enhancement. IMPRESSION: 1. New moderate right-sided hydronephrosis, which may account for acute kidney injury in the appropriate clinical setting. 2. Echogenic lesion in the anterior bladder, incompletely evaluated; recommend CT abdomen and pelvis with delayed bladder imaging for further characterization. Electronically signed by: Oneil Devonshire MD 07/09/2024 08:56 PM EST RP Workstation: [id number]   DG Chest Portable 1 View Result Date: 07/09/2024 EXAM: 1 VIEW(S) XRAY OF THE CHEST 07/09/2024 07:08:00 PM COMPARISON: 07/09/2024 CLINICAL HISTORY: SOB - worsening FINDINGS: LUNGS AND PLEURA: Chronic elevation of right hemidiaphragm. Diminished lung volumes, particularly on right side. Persistent hazy opacity at right lung base, may represent atelectasis or pneumonia. No pleural effusion. No pneumothorax. HEART AND MEDIASTINUM: No acute abnormality of the cardiac and mediastinal silhouettes. BONES AND SOFT TISSUES: No acute osseous abnormality. IMPRESSION: 1. Persistent hazy opacity at the right lung base concerning for pneumonia versus atelectasis. 2. Chronic elevation of the right hemidiaphragm with diminished right-sided lung volumes. Electronically signed by: Greig Pique MD 07/09/2024 07:25 PM EST RP Workstation: [id number]   US  Venous Img Lower Right (DVT Study) Result Date: 07/09/2024 CLINICAL DATA:  Left lower extremity edema. EXAM: LEFT LOWER EXTREMITY VENOUS DOPPLER ULTRASOUND TECHNIQUE: Gray-scale sonography with graded compression, as well as color Doppler and duplex ultrasound were performed to evaluate the lower extremity deep venous systems from the level of the common femoral vein and including the common femoral, femoral, profunda femoral, popliteal and calf veins including the posterior tibial, peroneal and gastrocnemius veins when visible. The superficial great saphenous  vein was also interrogated. Spectral Doppler was utilized to evaluate flow at rest and with distal augmentation maneuvers in the common femoral, femoral and popliteal veins. COMPARISON:  None Available. FINDINGS: Contralateral Common Femoral Vein: Respiratory phasicity is normal and symmetric with the symptomatic side. No evidence of thrombus. Normal compressibility. Common Femoral Vein: No evidence of thrombus. Normal compressibility, respiratory phasicity and response to augmentation. Saphenofemoral Junction: No evidence of thrombus. Normal compressibility and flow on color Doppler imaging. Profunda Femoral Vein: No evidence of thrombus. Normal compressibility and flow on color Doppler imaging. Femoral Vein: No evidence of thrombus. Normal compressibility, respiratory phasicity and response to augmentation. Popliteal Vein: No evidence of thrombus. Normal compressibility, respiratory phasicity and response to augmentation. Calf Veins: No evidence of thrombus. Normal compressibility and flow on color Doppler imaging. Superficial Great Saphenous Vein: No evidence of thrombus. Normal compressibility. Venous Reflux:  None. Other Findings: No evidence of superficial thrombophlebitis or abnormal fluid collection. IMPRESSION: No evidence of left lower extremity deep venous thrombosis. Electronically Signed   By: Marcey Moan M.D.   On: 07/09/2024 15:19   DG Chest Portable 1 View Result Date: 07/09/2024 EXAM: 1 VIEW(S) XRAY OF THE CHEST 07/09/2024 12:40:20 PM COMPARISON: CT chest dated 06/29/2023. CLINICAL HISTORY: Shortness of breath with increased leg swelling and weakness. FINDINGS: LUNGS AND PLEURA: Shallow inspiration. Elevation of the right hemidiaphragm with interposition of bowel contents under the hemidiaphragm. Atelectasis in the right lung base with probable small pleural effusion. No pneumothorax. HEART AND MEDIASTINUM: Heart size is normal given the technique. No acute abnormality of the cardiac and  mediastinal silhouettes. BONES AND SOFT TISSUES: Degenerative changes in the spine. IMPRESSION: 1. Atelectasis in the right lung base with probable small pleural effusion. 2. Elevation of the right hemidiaphragm with interposed bowel beneath the hemidiaphragm. Electronically  signed by: Elsie Gravely MD 07/09/2024 01:15 PM EST RP Workstation: [id number]     Assessment and Plan:  Acute heart failure [No prior hx of heart failure or HF admission] RV dysfunction Worsening peripheral edema despite outpatient diuretic changes. Patient reported no change in UOP with torsemide .  Hx provided by patient and family: worsening SOB, orthopnea, weakness, and progressive peripheral edema.  On admission, AKI with elevated LFTs. ProBNP 909 Patient became somnolent at [facility], he was placed on BiPAP for acute respiratory failure with hypercapnia.  Currently off supplemental oxygen, SpO2 ~90%, denied SOB. He has received one dose of IV lasix  40mg . ~2L UOP  Net IO Since Admission: -1,350 mL [07/10/24 0958] No weight recorded. Weight at recent OP visit 302lb. Will update. On exam, patient appears volume overloaded, though he does have chronic lymphedema and venous insufficiency  Echo pending  Give IV lasix  40 mg Hold on PTA BB for now.   Acute respiratory failure with hypercapnia Suspect multifactorial as he is currently being worked up for suspected acute heart failure and this admission has evidence for acute myositis.  Denied hx of OSA. Repeat CXR showed possible PNA.   Chronic Venous insufficiency  Chronic lymphedema Could be contributing to physical exam Continue compression stocking, leg elevation, and wound care.  Diuresis as above.   Cellulitis Patient admitted with right leg cellulitis. Patient reports pain to right leg and worse edema. On exam hard to appreciate with degree of compression wrapping. Currently receiving antibiotics.  DVT discrepancy, will reach out to rads  Reached out to  hospitalist.   CAD s/p BME to LAD in 1998 and DES to RCA in 2015 Denied chest pain ECG without acute ischemia changes Antiplatelet deferred 2/2 chronic anticoagulation  Elevated Troponin Could be demand ischemia, though Troponin T could be elevated 2/2 myositis as it strongly correlated with CK. See below.   Myositis  Patient has a history of inclusion body myositis. Reported weakness. CK elevated on admission, AKI. Both improving.  Patient does have elevated hemidiaphragm which could be due myositis. Defer to primary to assess it's contribution to acute respiratory failure.   Hypertension BP: 105/55 Patient hypotensive on arrival. BP medications on hold.  Hyperlipidemia Crestor  10 mg on hold with possible myositis exacerbation restart Zetia  10 mg when okay with primary  Hx of unprovoked DVT Continue eliquis  2.5 mg BID  Per primary Hydronephrosis Anterior bladder lesion Possible PNA? Chronic Anemia BPH Insomnia  AKI Cellulitis  Risk Assessment/Risk Scores:        For questions or updates, please contact [facility] Please consult [email or web address] for contact info under      Signed, Leontine LOISE Salen, PA-C  07/10/2024 9:32 AM     [1] No Known Allergies

## 2024-07-10 NOTE — Progress Notes (Signed)
 OT Cancellation Note  Patient Details Name: [name] MRN: [id number] DOB: 15-Nov-1950   Cancelled Treatment:    Reason Eval/Treat Not Completed: Patient declined, no reason specified Pt declined participating in OT evaluation stating, I'm not moving. Will follow up with pt at another time to re-attempt evaluation.    Leita Howell, OTR/L,CBIS  Supplemental OT - [facility] and [facility] Secure Chat Preferred   07/10/2024, 12:47 PM

## 2024-07-10 NOTE — Consult Note (Addendum)
 WOC Nurse Consult Note: Pt denies any wounds to left leg and wears a velco compression device to reduce edema.   Right leg with 2 areas of chronic full thickness healing wounds. Pt is followed as an outpatient by the wound care center and states they are using Aquacel and light compression and dressings are changed weekly. Right leg with generalized edema and erythremia and loose scaly scabbed skin to scattered areas.  Right posterior leg with full thickness wound, yellow and moist, small amt yellow drainage, .4X.4X.2cm  Right outer leg with healing wound, .2X.2X.2cm, small amt yellow drainage, yellow wound bed Right outer ankle with healing wound, .3X.3X.2cm, yellow drainage, yellow wound bed.   Dressing procedure/placement/frequency: Topical treatment orders provided for bedside nurses to perform to continue previous plan of care: Change dressing to right leg Q TUES as follows: Apply a piece of Aquacel Soila # [phone]) to right leg wounds (3 places), then cover with kerlex and ace wrap, beginning just behind toes to below knees.  Moisten previous dressings with NS to assist with removal each time.   Pt should resume previous plan of care after discharge with the outpatient wound care center.   Please re-consult if further assistance is needed.  Thank-you,  Stephane Fought MSN, RN, CWOCN, CWCN-AP, CNS Contact Mon-Fri 0700-1500: [phone]

## 2024-07-10 NOTE — Progress Notes (Signed)
 Primofit with leaks. Failed to obtain urine o/p d/t incontinence. Pt was able to use commode once. Unsteady gait. Unable to make steps.

## 2024-07-10 NOTE — Progress Notes (Signed)
 PROGRESS NOTE    [name]  FMW:[id number] DOB: 08/22/1950 DOA: 07/09/2024 PCP: Candise Aleene DEL, [name]  Chief Complaint  Patient presents with   Leg Swelling    Brief Narrative:   [name] is [name] 73 y.o. 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    Patient very drowsy during virtual admission encounter and also hard of hearing therefore his daughter at bedside assisted with providing history.  Patient had been seen by cardiology on Thursday (12/11) due to his worsening leg swelling.  His oral diuretic regimen was increased but apparently without improvement, swelling continue to worsen and patient began complaining of more shortness of breath, dyspnea on exertion and orthopnea.   He's been admitted with Adelle Zachar heart failure exacerbation and concern for right lower extremity cellulitis.    Assessment & Plan:   Principal Problem:   Acute on chronic heart failure with preserved ejection fraction (HFpEF) (HCC) Active Problems:   Cellulitis of right lower extremity   AKI (acute kidney injury)   Coronary artery disease   HTN (hypertension)   Bilateral lower extremity edema   Elevated CK   Myositis   Hyperlipidemia   Weakness   Chronic radicular lumbar pain   History of DVT (deep vein thrombosis)  Acute on chronic heart failure with preserved ejection fraction (HFpEF) (HCC) Patient presenting with progressively worsening swelling, dyspnea worse on exertion, orthopnea with progressive symptoms despite increase in oral diuretics just 4 days ago as outpatient with cardiology.   Last echo in November 2024 was poor acoustic windows and limited but showed normal EF 55 to 60%, no LV regional  WMA's, mild LVH, mild MR. 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  Patient given 40 mg IV Lasix  in the ED due to signs of volume overload as outlined.   UA without protein or RBC's  Renal US  with moderate R hydro - will c/s urology Will pursue CT abd/eplvis with delayed bladder imaging at some time, but will trend kidney function first Improved with diuresis   Cellulitis of right lower extremity Right lower extremity erythema and new skin breakdown in the setting of edema with weeping, consistent with cellulitis.  DVT was ruled out on ultrasound in the ED.  Treated with vancomycin  and cefepime  in the ED --will plan for Deejay Koppelman short course of abx --afebrile without leukocytosis --Follow blood cultures --Wound care consulted   Elevated CK Etiology unclear, CK elevated at 1802 on admission.  No reported trauma history or prolonged downtime, but patient is quite sedentary at baseline.  He had Hunt Zajicek normal CK level 1 year ago at 182.  Does have baseline inclusion body myositis. Holding statin for now Will continue to trend Currently with AKI, creatinine 1.98 --mildly elevated AST - trend --  Check urine myoglobin (pending)   Bilateral lower extremity edema Chronic venous insufficiency Chronic lower extremity wounds -followed by wound care clinic long-term Now with new wounds/skin breakdown related to his severe edema with  weeping. -- Wound care consulted, appreciate recs   HTN (hypertension) Holding home lisinopril  with AKI above Continue atenolol     Coronary artery disease Elevated troponin -mild, and trended down, 169>> 15.  Patient has no complaints of chest pain and EKG showed no acute ischemic changes. Atenolol , crestor  on hold Echocardiogram pending   History of DVT (deep vein thrombosis) On Eliquis  2.5 mg twice daily and daughter reports good compliance with this. Right lower extremity venous Doppler ultrasound was negative for DVT in the ED. 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 PT/OT     DVT prophylaxis: eliquis  Code Status: full Family Communication: none  Disposition:   Status is: Observation The patient remains OBS appropriate and will d/c before 2 midnights.   Consultants:  cardiology  Procedures:  none  Antimicrobials:  Anti-infectives (From admission, onward)    Start     Dose/Rate Route Frequency Ordered Stop   07/09/24 1745  ceFAZolin  (ANCEF ) IVPB 2g/100 mL premix        2 g 200 mL/hr over 30 Minutes Intravenous Every 8 hours 07/09/24 1734 07/16/24 1359   07/09/24 1500  vancomycin  (VANCOCIN ) IVPB 1000 mg/200 mL premix       Placed in Followed by Linked Group   1,000 mg 200 mL/hr over 60 Minutes Intravenous  Once 07/09/24 1406 07/09/24 1808   07/09/24 1415  vancomycin  (VANCOCIN ) IVPB 1000 mg/200 mL premix  Status:  Discontinued        1,000 mg 200 mL/hr over 60 Minutes Intravenous  Once 07/09/24 1404 07/09/24 1406   07/09/24 1415  ceFEPIme  (MAXIPIME ) 2  g in sodium chloride  0.9 % 100 mL IVPB        2 g 200 mL/hr over 30 Minutes Intravenous  Once 07/09/24 1404 07/09/24 1525   07/09/24 1415  vancomycin  (VANCOCIN ) IVPB 1000 mg/200 mL premix       Placed in Followed by Linked Group   1,000 mg 200 mL/hr over 60 Minutes Intravenous  Once 07/09/24 1406 07/09/24 1658       Subjective: No new complaints Notes worsening swelling recently  Objective: Vitals:   07/10/24 0700 07/10/24 0730 07/10/24 0745 07/10/24 0845  BP: (!) 127/55 (!) 105/55    Pulse: 72 88 80 83  Resp: 20 18 20 18   Temp:      TempSrc:      SpO2: 100% 100% 100% 96%    Intake/Output Summary (Last 24 hours) at 07/10/2024 1100 Last data filed at 07/10/2024 0641 Gross per 24 hour  Intake 600 ml  Output 1950 ml  Net -1350 ml   There were no vitals filed for this visit.  Examination:  General exam: obese, chronically ill appearing Respiratory system: Clear to auscultation. Respiratory effort normal. Cardiovascular system: RRR Gastrointestinal system: Abdomen is nondistended, soft and nontender. Central nervous system: Alert and oriented. No focal neurological deficits. Scrotal edema Extremities: R>L LE edema -  edema to thighs bilaterally, chronic LE wounds noted    Data Reviewed: I have personally reviewed following labs and imaging studies  CBC: Recent Labs  Lab 07/09/24 1230 07/09/24 1833 07/09/24 1936 07/09/24 2041 07/10/24 0325  WBC 6.3  --   --   --   --   HGB 11.2* 12.2* 11.6* 11.6* 11.2*  HCT 34.5* 36.0* 34.0* 34.0* 33.0*  MCV 106.5*  --   --   --   --   PLT 71*  --   --   --   --     Basic Metabolic Panel: Recent Labs  Lab 07/09/24 1230 07/09/24 1833 07/09/24 1936 07/09/24 2041 07/10/24 0325 07/10/24 0514  NA 140 137 138 139 139 142  K 4.8 4.4 4.4 4.4 4.3 4.5  CL 101  --   --   --   --  101  CO2 29  --   --   --   --  31  GLUCOSE 95  --   --   --   --  118*  BUN 44*  --   --   --   --  41*  CREATININE 1.98*  --   --   --   --   1.48*  CALCIUM  9.2  --   --   --   --  9.3    GFR: Estimated Creatinine Clearance (by C-G formula based on SCr of 1.48 mg/dL (H)) Male: 50.2 mL/min ([name]) Male: 60.1 mL/min ([name])  Liver Function Tests: Recent Labs  Lab 07/09/24 1500  AST 104*  ALT 35  ALKPHOS 77  BILITOT 0.6  PROT 6.6  ALBUMIN 3.7    CBG: No results for input(s): GLUCAP in the last 168 hours.   No results found for this or any previous visit (from the past 240 hours).       Radiology Studies: US  RENAL Result Date: 07/09/2024 EXAM: US  Retroperitoneum Complete, Renal. 07/09/2024 08:45:45 PM TECHNIQUE: Real-time ultrasonography of the kidneys was performed. COMPARISON: None available CLINICAL HISTORY: [id number] AKI (acute kidney injury) 409830 AKI (acute kidney injury) FINDINGS: FINDINGS: RIGHT KIDNEY/URETER: Right kidney measures 12.0 x 6.3 x 3.6 cm. Calculated volume is 142 ml. New right-sided hydronephrosis is noted of [name] moderate degree. Normal cortical echogenicity. No calculus. No mass. LEFT KIDNEY/URETER: Left kidney measures 11.1 x 5.9 x 4.3 cm. The calculated volume is 147 ml. Normal cortical echogenicity. No hydronephrosis. No calculus. No mass. BLADDER: There is suggestion of an echogenic lesion in the anterior aspect of the bladder although incompletely evaluated on this exam. CT of the abdomen and pelvis may be helpful for further evaluation with delayed images through the bladder to assess for enhancement. IMPRESSION: 1. New moderate right-sided hydronephrosis, which may account for acute kidney injury in the appropriate clinical setting. 2. Echogenic lesion in the anterior bladder, incompletely evaluated; recommend CT abdomen and pelvis with delayed bladder imaging for further characterization. Electronically signed by: [name] 07/09/2024 08:56 PM EST RP Workstation: [id number]   DG Chest Portable 1 View Result Date: 07/09/2024 EXAM: 1 VIEW(S) XRAY OF THE CHEST 07/09/2024 07:08:00 PM COMPARISON:  07/09/2024 CLINICAL HISTORY: SOB - worsening FINDINGS: LUNGS AND PLEURA: Chronic elevation of right hemidiaphragm. Diminished lung volumes, particularly on right side. Persistent hazy opacity at right lung base, may represent atelectasis or pneumonia. No pleural effusion. No pneumothorax. HEART AND MEDIASTINUM: No acute abnormality of the cardiac and mediastinal silhouettes. BONES AND SOFT TISSUES: No acute osseous abnormality. IMPRESSION: 1. Persistent hazy opacity  at the right lung base concerning for pneumonia versus atelectasis. 2. Chronic elevation of the right hemidiaphragm with diminished right-sided lung volumes. Electronically signed by: [name] 07/09/2024 07:25 PM EST RP Workstation: [id number]   US  Venous Img Lower Right (DVT Study) Result Date: 07/09/2024 CLINICAL DATA:  Left lower extremity edema. EXAM: LEFT LOWER EXTREMITY VENOUS DOPPLER ULTRASOUND TECHNIQUE: Gray-scale sonography with graded compression, as well as color Doppler and duplex ultrasound were performed to evaluate the lower extremity deep venous systems from the level of the common femoral vein and including the common femoral, femoral, profunda femoral, popliteal and calf veins including the posterior tibial, peroneal and gastrocnemius veins when visible. The superficial great saphenous vein was also interrogated. Spectral Doppler was utilized to evaluate flow at rest and with distal augmentation maneuvers in the common femoral, femoral and popliteal veins. COMPARISON:  None Available. FINDINGS: Contralateral Common Femoral Vein: Respiratory phasicity is normal and symmetric with the symptomatic side. No evidence of thrombus. Normal compressibility. Common Femoral Vein: No evidence of thrombus. Normal compressibility, respiratory phasicity and response to augmentation. Saphenofemoral Junction: No evidence of thrombus. Normal compressibility and flow on color Doppler imaging. Profunda Femoral Vein: No evidence of thrombus.  Normal compressibility and flow on color Doppler imaging. Femoral Vein: No evidence of thrombus. Normal compressibility, respiratory phasicity and response to augmentation. Popliteal Vein: No evidence of thrombus. Normal compressibility, respiratory phasicity and response to augmentation. Calf Veins: No evidence of thrombus. Normal compressibility and flow on color Doppler imaging. Superficial Great Saphenous Vein: No evidence of thrombus. Normal compressibility. Venous Reflux:  None. Other Findings: No evidence of superficial thrombophlebitis or abnormal fluid collection. IMPRESSION: No evidence of left lower extremity deep venous thrombosis. Electronically Signed   By: [name] Moan M.D.   On: 07/09/2024 15:19   DG Chest Portable 1 View Result Date: 07/09/2024 EXAM: 1 VIEW(S) XRAY OF THE CHEST 07/09/2024 12:40:20 PM COMPARISON: CT chest dated 06/29/2023. CLINICAL HISTORY: Shortness of breath with increased leg swelling and weakness. FINDINGS: LUNGS AND PLEURA: Shallow inspiration. Elevation of the right hemidiaphragm with interposition of bowel contents under the hemidiaphragm. Atelectasis in the right lung base with probable small pleural effusion. No pneumothorax. HEART AND MEDIASTINUM: Heart size is normal given the technique. No acute abnormality of the cardiac and mediastinal silhouettes. BONES AND SOFT TISSUES: Degenerative changes in the spine. IMPRESSION: 1. Atelectasis in the right lung base with probable small pleural effusion. 2. Elevation of the right hemidiaphragm with interposed bowel beneath the hemidiaphragm. Electronically signed by: [name] 07/09/2024 01:15 PM EST RP Workstation: [id number]        Scheduled Meds:  apixaban   2.5 mg Oral BID   Continuous Infusions:   ceFAZolin  (ANCEF ) IV Stopped (07/10/24 0545)     LOS: 1 day    Time spent: over 30 min    [name] Monte, [name] [facility]   To contact the attending provider between 7A-7P or the  covering provider during after hours 7P-7A, please log into the web site [email or web address] and access using universal [place] password for that web site. If you do not have the password, please call the hospital operator.  07/10/2024, 11:00 AM

## 2024-07-10 NOTE — Plan of Care (Signed)
 Telemedicine - Hospitalist Update  Notified by EDP yesterday evening of patient becoming more somnolent, was placed on BiPAP. VBG's showing acute respiratory failure with hypercapnia which has improved, pCO2 from 74 >> 57 as of around 3 AM this morning.  Patient remains on BiPAP this AM.  --Level of Care changed to Progressive yesterday evening, awaiting transfer from [facility] ED --Continue BiPAP PRN --Serial VBG's --Continue monitor closely   Time Spent 15 minutes

## 2024-07-10 NOTE — ED Notes (Signed)
[facility] here to transport patient.  

## 2024-07-10 NOTE — Progress Notes (Signed)
 [facility] night cross cover note:   Patient requesting sleep aid, noting that benadryl  and higher dose melatonin has previously been effective for him.   I subsequently placed orders for Benadryl  25 mg p.o. nightly as needed for sleep as well as melatonin 10 mg p.o. nightly.    Eva Pore, DO Hospitalist

## 2024-07-10 NOTE — Progress Notes (Signed)
 Echocardiogram 2D Echocardiogram has been performed.  [name] 07/10/2024, 5:11 PM

## 2024-07-11 DIAGNOSIS — G4700 Insomnia, unspecified: Secondary | ICD-10-CM | POA: Diagnosis present

## 2024-07-11 DIAGNOSIS — I5033 Acute on chronic diastolic (congestive) heart failure: Secondary | ICD-10-CM | POA: Diagnosis present

## 2024-07-11 DIAGNOSIS — M5416 Radiculopathy, lumbar region: Secondary | ICD-10-CM | POA: Diagnosis not present

## 2024-07-11 DIAGNOSIS — Z6841 Body Mass Index (BMI) 40.0 and over, adult: Secondary | ICD-10-CM | POA: Diagnosis not present

## 2024-07-11 DIAGNOSIS — M7989 Other specified soft tissue disorders: Secondary | ICD-10-CM | POA: Diagnosis present

## 2024-07-11 DIAGNOSIS — D649 Anemia, unspecified: Secondary | ICD-10-CM | POA: Diagnosis present

## 2024-07-11 DIAGNOSIS — Z23 Encounter for immunization: Secondary | ICD-10-CM | POA: Diagnosis present

## 2024-07-11 DIAGNOSIS — A419 Sepsis, unspecified organism: Secondary | ICD-10-CM | POA: Diagnosis not present

## 2024-07-11 DIAGNOSIS — N179 Acute kidney failure, unspecified: Secondary | ICD-10-CM | POA: Diagnosis not present

## 2024-07-11 DIAGNOSIS — E8729 Other acidosis: Secondary | ICD-10-CM | POA: Diagnosis present

## 2024-07-11 DIAGNOSIS — F32A Depression, unspecified: Secondary | ICD-10-CM | POA: Diagnosis present

## 2024-07-11 DIAGNOSIS — J9612 Chronic respiratory failure with hypercapnia: Secondary | ICD-10-CM | POA: Diagnosis not present

## 2024-07-11 DIAGNOSIS — N1330 Unspecified hydronephrosis: Secondary | ICD-10-CM | POA: Diagnosis not present

## 2024-07-11 DIAGNOSIS — R6521 Severe sepsis with septic shock: Secondary | ICD-10-CM | POA: Diagnosis not present

## 2024-07-11 DIAGNOSIS — Z86718 Personal history of other venous thrombosis and embolism: Secondary | ICD-10-CM | POA: Diagnosis not present

## 2024-07-11 DIAGNOSIS — E66813 Obesity, class 3: Secondary | ICD-10-CM | POA: Diagnosis present

## 2024-07-11 DIAGNOSIS — Z87891 Personal history of nicotine dependence: Secondary | ICD-10-CM | POA: Diagnosis not present

## 2024-07-11 DIAGNOSIS — E785 Hyperlipidemia, unspecified: Secondary | ICD-10-CM | POA: Diagnosis present

## 2024-07-11 DIAGNOSIS — I5030 Unspecified diastolic (congestive) heart failure: Secondary | ICD-10-CM | POA: Diagnosis not present

## 2024-07-11 DIAGNOSIS — Z7901 Long term (current) use of anticoagulants: Secondary | ICD-10-CM | POA: Diagnosis not present

## 2024-07-11 DIAGNOSIS — L03115 Cellulitis of right lower limb: Secondary | ICD-10-CM | POA: Diagnosis not present

## 2024-07-11 DIAGNOSIS — I10 Essential (primary) hypertension: Secondary | ICD-10-CM | POA: Diagnosis not present

## 2024-07-11 DIAGNOSIS — I2510 Atherosclerotic heart disease of native coronary artery without angina pectoris: Secondary | ICD-10-CM | POA: Diagnosis present

## 2024-07-11 DIAGNOSIS — D696 Thrombocytopenia, unspecified: Secondary | ICD-10-CM | POA: Diagnosis present

## 2024-07-11 DIAGNOSIS — I110 Hypertensive heart disease with heart failure: Secondary | ICD-10-CM | POA: Diagnosis not present

## 2024-07-11 DIAGNOSIS — G9341 Metabolic encephalopathy: Secondary | ICD-10-CM | POA: Diagnosis not present

## 2024-07-11 DIAGNOSIS — L309 Dermatitis, unspecified: Secondary | ICD-10-CM | POA: Diagnosis not present

## 2024-07-11 DIAGNOSIS — J9811 Atelectasis: Secondary | ICD-10-CM | POA: Diagnosis present

## 2024-07-11 DIAGNOSIS — G4733 Obstructive sleep apnea (adult) (pediatric): Secondary | ICD-10-CM | POA: Diagnosis present

## 2024-07-11 DIAGNOSIS — G8929 Other chronic pain: Secondary | ICD-10-CM | POA: Diagnosis not present

## 2024-07-11 DIAGNOSIS — I77810 Thoracic aortic ectasia: Secondary | ICD-10-CM | POA: Diagnosis present

## 2024-07-11 DIAGNOSIS — E875 Hyperkalemia: Secondary | ICD-10-CM | POA: Diagnosis not present

## 2024-07-11 DIAGNOSIS — J9622 Acute and chronic respiratory failure with hypercapnia: Secondary | ICD-10-CM | POA: Diagnosis not present

## 2024-07-11 DIAGNOSIS — G7241 Inclusion body myositis [IBM]: Secondary | ICD-10-CM | POA: Diagnosis present

## 2024-07-11 DIAGNOSIS — Z8659 Personal history of other mental and behavioral disorders: Secondary | ICD-10-CM | POA: Diagnosis not present

## 2024-07-11 LAB — COMPREHENSIVE METABOLIC PANEL WITH GFR
ALT: 21 U/L (ref 0–44)
AST: 115 U/L — ABNORMAL HIGH (ref 15–41)
Albumin: 3.3 g/dL — ABNORMAL LOW (ref 3.5–5.0)
Alkaline Phosphatase: 58 U/L (ref 38–126)
Anion gap: 11 (ref 5–15)
BUN: 47 mg/dL — ABNORMAL HIGH (ref 8–23)
CO2: 30 mmol/L (ref 22–32)
Calcium: 9.2 mg/dL (ref 8.9–10.3)
Chloride: 99 mmol/L (ref 98–111)
Creatinine, Ser: 1.46 mg/dL — ABNORMAL HIGH (ref 0.61–1.24)
GFR, Estimated: 50 mL/min — ABNORMAL LOW (ref >60)
Glucose, Bld: 105 mg/dL — ABNORMAL HIGH (ref 70–99)
Potassium: 4.7 mmol/L (ref 3.5–5.1)
Sodium: 140 mmol/L (ref 135–145)
Total Bilirubin: 0.6 mg/dL (ref 0.0–1.2)
Total Protein: 5.8 g/dL — ABNORMAL LOW (ref 6.5–8.1)

## 2024-07-11 LAB — CBC WITH DIFFERENTIAL/PLATELET
Abs Immature Granulocytes: 0.1 K/uL — ABNORMAL HIGH (ref 0.00–0.07)
Basophils Absolute: 0 K/uL (ref 0.0–0.1)
Basophils Relative: 0 %
Eosinophils Absolute: 0 K/uL (ref 0.0–0.5)
Eosinophils Relative: 0 %
HCT: 33.5 % — ABNORMAL LOW (ref 39.0–52.0)
Hemoglobin: 10.9 g/dL — ABNORMAL LOW (ref 13.0–17.0)
Immature Granulocytes: 1 %
Lymphocytes Relative: 7 %
Lymphs Abs: 0.8 K/uL (ref 0.7–4.0)
MCH: 35.2 pg — ABNORMAL HIGH (ref 26.0–34.0)
MCHC: 32.5 g/dL (ref 30.0–36.0)
MCV: 108.1 fL — ABNORMAL HIGH (ref 80.0–100.0)
Monocytes Absolute: 1.4 K/uL — ABNORMAL HIGH (ref 0.1–1.0)
Monocytes Relative: 12 %
Neutro Abs: 9.7 K/uL — ABNORMAL HIGH (ref 1.7–7.7)
Neutrophils Relative %: 80 %
Platelets: 88 K/uL — ABNORMAL LOW (ref 150–400)
RBC: 3.1 MIL/uL — ABNORMAL LOW (ref 4.22–5.81)
RDW: 14.2 % (ref 11.5–15.5)
WBC: 12.1 K/uL — ABNORMAL HIGH (ref 4.0–10.5)
nRBC: 0 % (ref 0.0–0.2)

## 2024-07-11 LAB — PHOSPHORUS: Phosphorus: 3.3 mg/dL (ref 2.5–4.6)

## 2024-07-11 LAB — CK: Total CK: 1305 U/L — ABNORMAL HIGH (ref 49–397)

## 2024-07-11 LAB — MAGNESIUM: Magnesium: 2.2 mg/dL (ref 1.7–2.4)

## 2024-07-11 MED ORDER — LIDOCAINE 5 % EX PTCH
1.0000 | MEDICATED_PATCH | CUTANEOUS | Status: DC
  Administered 2024-07-11 – 2024-07-14 (×2): 1 via TRANSDERMAL
  Filled 2024-07-11 (×10): qty 1

## 2024-07-11 MED ORDER — SODIUM CHLORIDE 0.9% FLUSH: 10.0000 mL | INTRAVENOUS | Status: DC | PRN

## 2024-07-11 MED ORDER — SODIUM CHLORIDE 0.9% FLUSH
10.0000 mL | Freq: Two times a day (BID) | INTRAVENOUS | Status: DC
  Administered 2024-07-12 – 2024-07-20 (×14): 10 mL

## 2024-07-11 NOTE — Evaluation (Signed)
 Physical Therapy Evaluation Patient Details Name: [name] MRN: [id number] DOB: [birth year] Today's Date: 07/11/2024  History of Present Illness  73 yo M adm 07/09/24 with LB edema, SOB, orthopnea, CHF, RLE cellulitis, Rt hydronephrosis. PMHx: CAD, HTN, HLD, inclusion body myositis, DVT, chonic LB edema, paralyzed Rt hemidiaphragm  Clinical Impression  Prior to 1 week ago, pt was ModI to stand-pivot for transfers to power WC with ability to ambulate to the bathroom with use of RW. Pt had a decline in mobility over the past week and was needing more assistance from family. Pt presents with R>L UE/LE weakness and ROM limitations, R LE edema/pain, and impaired balance/gait pattern. Pt was able to stand from elevated bed height with ModAx2 and use of RW. Able to then take shuffling steps towards [facility] with increased time and effort. Required up to ModAx2 for bed mobility with assist for B LE's. Pt has 24/7 assist available upon d/c home. Recommending >3hrs post acute rehab with acute PT to follow.         If plan is discharge home, recommend the following: A lot of help with walking and/or transfers;A lot of help with bathing/dressing/bathroom;Assist for transportation;Help with stairs or ramp for entrance;Assistance with cooking/housework   Can travel by private vehicle    No    Equipment Recommendations None recommended by PT  Recommendations for Other Services  Rehab consult    Functional Status Assessment Patient has had a recent decline in their functional status and demonstrates the ability to make significant improvements in function in a reasonable and predictable amount of time.     Precautions / Restrictions Precautions Precautions: Fall Recall of Precautions/Restrictions: Intact Restrictions Weight Bearing Restrictions Per Provider Order: No      Mobility  Bed Mobility Overal bed mobility: Needs Assistance Bed Mobility: Sit to Sidelying, Rolling Rolling: Min assist     Sit to sidelying: Mod assist, +2 for physical assistance, +2 for safety/equipment General bed mobility comments: able to lower onto elbow with ModAx2 to bring LE's onto EOB (R LE painful with movement) and adjust shoulders. Able to roll with MinA    Transfers Overall transfer level: Needs assistance Equipment used: Rolling walker (2 wheels) Transfers: Sit to/from Stand Sit to Stand: Mod assist, +2 physical assistance, +2 safety/equipment, From elevated surface    General transfer comment: ModAx2 from elevated bed height with assist to boost-up and steady. Able to then take side-steps towards [facility]    Ambulation/Gait  General Gait Details: unable this date     Balance Overall balance assessment: Needs assistance, Mild deficits observed, not formally tested Sitting-balance support: No upper extremity supported, Feet supported Sitting balance-Leahy Scale: Fair     Standing balance support: Bilateral upper extremity supported, During functional activity, Reliant on assistive device for balance Standing balance-Leahy Scale: Poor Standing balance comment: reliant on UE and external support        Pertinent Vitals/Pain Pain Assessment Pain Assessment: Faces Faces Pain Scale: Hurts little more Pain Location: R LE with touch and movement Pain Descriptors / Indicators: Discomfort Pain Intervention(s): Limited activity within patient's tolerance, Monitored during session, Repositioned    Home Living Family/patient expects to be discharged to:: Private residence Living Arrangements: Children;Spouse/significant other (daughter, son-in-law) Available Help at Discharge: Family;Available 24 hours/day Type of Home: House Home Access: Level entry    Home Layout: One level Home Equipment: Agricultural Consultant (2 wheels);Cane - single point;Wheelchair - power;Grab bars - tub/shower;Shower seat;Grab bars - toilet (adjustable bed) Additional Comments: Pt/spouse live in  basement with ground level  entry. Spouse uses WC for mobility    Prior Function Prior Level of Function : Needs assist      Mobility Comments: Independent to transfer to power WC up until 1 week prior. Could walk a short distance with a RW. Was needing increased assistance. ADLs Comments: Normally Ind, more assist in the past week     Extremity/Trunk Assessment   Upper Extremity Assessment Upper Extremity Assessment: Defer to OT evaluation    Lower Extremity Assessment Lower Extremity Assessment: RLE deficits/detail;LLE deficits/detail RLE Deficits / Details: Hip flexion 2-/5, Knee ext 2+/5, Ankle DF 2-/5 with MMT. Able to bear weight in standing and take a few steps. Edema from digits to distal patella RLE Sensation: WNL (reporting N/T in toes) LLE Deficits / Details: Hip flexion 2+/5, Knee ext 4/5, Ankle DF 2/5 LLE Sensation: WNL (reports N/T in toes)    Cervical / Trunk Assessment Cervical / Trunk Assessment: Other exceptions Cervical / Trunk Exceptions: increased body habitus  Communication   Communication Communication: Impaired Factors Affecting Communication: Hearing impaired    Cognition Arousal: Alert Behavior During Therapy: WFL for tasks assessed/performed   PT - Cognitive impairments: No apparent impairments    Following commands: Intact       Cueing Cueing Techniques: Verbal cues, Tactile cues      PT Assessment Patient needs continued PT services  PT Problem List Decreased strength;Decreased activity tolerance;Decreased balance;Decreased mobility       PT Treatment Interventions DME instruction;Gait training;Functional mobility training;Therapeutic activities;Therapeutic exercise;Balance training;Neuromuscular re-education;Patient/family education    PT Goals (Current goals can be found in the Care Plan section)  Acute Rehab PT Goals Patient Stated Goal: to get stronger PT Goal Formulation: With patient Time For Goal Achievement: 07/25/24 Potential to Achieve Goals:  Good    Frequency Min 2X/week     Co-evaluation   Reason for Co-Treatment: For patient/therapist safety;To address functional/ADL transfers PT goals addressed during session: Mobility/safety with mobility;Balance         AM-PAC PT 6 Clicks Mobility  Outcome Measure Help needed turning from your back to your side while in a flat bed without using bedrails?: A Little Help needed moving from lying on your back to sitting on the side of a flat bed without using bedrails?: Total Help needed moving to and from a bed to a chair (including a wheelchair)?: Total Help needed standing up from a chair using your arms (e.g., wheelchair or bedside chair)?: Total Help needed to walk in hospital room?: Total Help needed climbing 3-5 steps with a railing? : Total 6 Click Score: 8    End of Session Equipment Utilized During Treatment: Gait belt Activity Tolerance: Patient tolerated treatment well Patient left: in bed;with call bell/phone within reach;with bed alarm set Nurse Communication: Mobility status PT Visit Diagnosis: Unsteadiness on feet (R26.81);Other abnormalities of gait and mobility (R26.89);Muscle weakness (generalized) (M62.81)    Time: 8973-8947 PT Time Calculation (min) (ACUTE ONLY): 26 min   Charges:   PT Evaluation $PT Eval Moderate Complexity: 1 Mod   PT General Charges $$ ACUTE PT VISIT: 1 Visit       Kate ORN, PT, DPT Secure Chat Preferred  Rehab Office [phone]   Kate BRAVO Wendolyn 07/11/2024, 1:02 PM

## 2024-07-11 NOTE — TOC Initial Note (Signed)
 Transition of Care [facility]) - Initial/Assessment Note    Patient Details  Name: [name] MRN: [id number] Date of Birth: 12-Feb-1951  [facility] CM/SW Contact:    Sudie Erminio Deems, RN Phone Number: 07/11/2024, 12:34 PM  Clinical Narrative: Patient presented for Lower extremity edema. Patient states he receives wound care [facility] one x a week. PTA patient states he was from home with spouse, daughter, and son-in-law. Patient states he has DME electric wheelchair and rolling walkers that he no longer uses. Patient has PCP and states his son-in-law takes him to appointments. Patient is not active with any HH Services at this time. Patient will benefit from PT/OT consult  and if the plan is for home; patient may benefit from [facility] RN. ICM will continue to follow for disposition needs as the patient progresses.    Expected Discharge Plan: Home w Home Health Services Barriers to Discharge: Continued Medical Work up   Patient Goals and CMS Choice            Expected Discharge Plan and Services In-house Referral: NA Discharge Planning Services: CM Consult Post Acute Care Choice: Home Health Living arrangements for the past 2 months: Single Family Home                   DME Agency: NA                  Prior Living Arrangements/Services Living arrangements for the past 2 months: Single Family Home Lives with:: Spouse, Adult Children Patient language and need for interpreter reviewed:: Yes Do you feel safe going back to the place where you live?: Yes      Need for Family Participation in Patient Care: Yes (Comment) Care giver support system in place?: Yes (comment) Current home services: DME (electric wheelchair. rolling walkers) Criminal Activity/Legal Involvement Pertinent to Current Situation/Hospitalization: No - Comment as needed  Activities of Daily Living   ADL Screening (condition at time of admission) Independently  performs ADLs?: Yes (appropriate for developmental age) Is the patient deaf or have difficulty hearing?: Yes Does the patient have difficulty seeing, even when wearing glasses/contacts?: No Does the patient have difficulty concentrating, remembering, or making decisions?: No  Permission Sought/Granted Permission sought to share information with : Family Supports, Case Manager                Emotional Assessment Appearance:: Appears stated age Attitude/Demeanor/Rapport: Engaged Affect (typically observed): Appropriate Orientation: : Oriented to Self, Oriented to  Time, Oriented to Place Alcohol / Substance Use: Not Applicable Psych Involvement: No (comment)  Admission diagnosis:  Acute on chronic heart failure with preserved ejection fraction (HFpEF) (HCC) [I50.33] Patient Active Problem List   Diagnosis Date Noted   Acute on chronic heart failure with preserved ejection fraction (HFpEF) (HCC) 07/09/2024   Cellulitis of right lower extremity 07/09/2024   Elevated CK 07/09/2024   AKI (acute kidney injury) 07/09/2024   History of DVT (deep vein thrombosis) 03/04/2022   Unilateral primary osteoarthritis, right hip 02/26/2020   Chronic pain syndrome 09/13/2019   Macrocytic anemia 06/12/2019   Screening for viral disease 06/12/2019   S/P cervical spinal fusion 05/22/2019   Cervical facet joint syndrome 05/22/2019   Chronic radicular lumbar pain 05/22/2019   Lumbar degenerative disc disease 05/22/2019   Lumbar spondylosis 05/22/2019   Lumbar facet arthropathy 05/22/2019   Insomnia 05/16/2019   Bilateral lower extremity edema 04/04/2019   BMI 37.0-37.9, adult 07/20/2018  Encounter for Medicare annual wellness exam 04/27/2018   Colon cancer screening 04/27/2018   Flu vaccine need 04/27/2018   Healthcare maintenance 02/15/2018   Stented coronary artery    Myositis    Hyperlipidemia    Coronary artery disease    Spinal stenosis    Weakness    HTN (hypertension)    PCP:   Candise Aleene DEL, MD Pharmacy:   [facility] [address] [place] [address] Phone: [phone] Fax: [phone]     Social Drivers of Health (SDOH) Social History: SDOH Screenings   Food Insecurity: No Food Insecurity (07/10/2024)  Housing: Low Risk (07/10/2024)  Transportation Needs: No Transportation Needs (07/10/2024)  Utilities: Not At Risk (07/10/2024)  Alcohol Screen: Low Risk (06/05/2024)  Depression (PHQ2-9): Low Risk (12/28/2023)  Financial Resource Strain: Low Risk (06/05/2024)  Physical Activity: Inactive (06/05/2024)  Social Connections: Socially Isolated (07/10/2024)  Stress: No Stress Concern Present (06/05/2024)  Tobacco Use: Medium Risk (07/09/2024)  Health Literacy: Adequate Health Literacy (12/28/2023)   SDOH Interventions:     Readmission Risk Interventions     No data to display

## 2024-07-11 NOTE — Progress Notes (Signed)

## 2024-07-11 NOTE — Progress Notes (Signed)
 Pt only able to tolerate BIPAP for ~2 hours overnight.

## 2024-07-11 NOTE — Progress Notes (Addendum)
 PROGRESS NOTE    [name]  FMW:[id number] DOB: [birth year] DOA: 07/09/2024 PCP: Candise Aleene DEL, MD  Chief Complaint  Patient presents with   Leg Swelling    Brief Narrative:   [name] is [name] 73 y.o. 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    Patient very drowsy during virtual admission encounter and also hard of hearing therefore his daughter at bedside assisted with providing history.  Patient had been seen by cardiology on Thursday (12/11) due to his worsening leg swelling.  His oral diuretic regimen was increased but apparently without improvement, swelling continue to worsen and patient began complaining of more shortness of breath, dyspnea on exertion and orthopnea.   He's been admitted with Dorsie Burich heart failure exacerbation and concern for right lower extremity cellulitis.    Assessment & Plan:   Principal Problem:   Acute on chronic heart failure with preserved ejection fraction (HFpEF) (HCC) Active Problems:   Cellulitis of right lower extremity   AKI (acute kidney injury)   Coronary artery disease   HTN (hypertension)   Bilateral lower extremity edema   Elevated CK   Myositis   Hyperlipidemia   Weakness   Chronic radicular lumbar pain   History of DVT (deep vein thrombosis)  Acute on chronic heart failure with preserved ejection fraction (HFpEF) (HCC) Patient presenting with progressively worsening swelling, dyspnea worse on exertion, orthopnea with progressive symptoms despite increase in oral diuretics just 4 days ago as outpatient with cardiology.   Last echo in November 2024 was poor acoustic windows and limited but showed normal EF 55 to 60%, no LV regional  WMA's, mild LVH, mild MR. Continue lasix  40 mg BID Appreciate cardiology consult Follow repeat echo  - with preserved EF, normal RVSF Strict I/O, daily weights (~1 L out yesterday, weights downtrending)   Chronic Shortness of Breath Paralyzed R Hemidiaphragm  Respiratory Acidosis  Required bipap overnight due to somnolence with respiratory acidosis - pH notably normal Will continue bipap at night Diuresis as noted above CXR with findings concerning for pneumonia vs atelectasis, afebrile without leukocytosis, low suspicion for pneumonia at this time  AKI (acute kidney injury) Moderate Right Hydronephrosis Echogenic Lesion in Anterior Bladder Creatinine on admission 1.89, up from 0.92 in mid October.  Patient given 40 mg IV Lasix  in the ED due to signs of volume overload as outlined.   UA without protein or RBC's  Renal US  with moderate R hydro - will c/s urology CT renal stone study pending per urology Will pursue CT abd/pelvis with delayed bladder imaging at some time, but will trend kidney function first Improved with diuresis   Cellulitis of right lower extremity Right lower extremity erythema and new skin breakdown in the setting of edema with weeping, consistent with cellulitis.  DVT was ruled out on ultrasound in the ED.  Treated with vancomycin  and cefepime  in the ED --will plan for Dnasia Gauna short course of abx (5 days) --afebrile without leukocytosis --Follow blood cultures --Wound care consulted   Elevated CK Etiology unclear, CK elevated at 1802 on admission.  No reported trauma history or prolonged downtime, but patient is quite sedentary at baseline.  He had Jacson Rapaport normal CK level 1 year ago at 182.  Does have baseline inclusion body myositis.  Holding statin for now Will continue to trend --mildly elevated AST - trend --Check urine myoglobin (pending)   Bilateral lower extremity edema Chronic venous insufficiency Chronic lower extremity wounds -followed by wound care clinic  long-term Now with new wounds/skin breakdown related to his severe edema with weeping. Wound care consulted, appreciate recs   HTN (hypertension) Holding home lisinopril  with AKI above Continue atenolol     Coronary artery disease Elevated troponin -mild, and trended down, 169>> 15.  Patient has no complaints of chest pain and EKG showed no acute ischemic changes. Atenolol , crestor  on hold Echocardiogram with preserved EF, unable to evaluate regional wall motion   History of DVT (deep vein thrombosis) On Eliquis  2.5 mg twice daily and daughter reports good compliance with this. Right lower extremity venous Doppler ultrasound was negative for DVT in the ED. 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 PT/OT     DVT prophylaxis: eliquis  Code Status: full Family Communication: none  Disposition:   Status is: Observation The patient remains OBS appropriate and will d/c before 2 midnights.   Consultants:  cardiology  Procedures:  none  Antimicrobials:  Anti-infectives (From admission, onward)    Start     Dose/Rate Route Frequency Ordered Stop   07/09/24 1745  ceFAZolin  (ANCEF ) IVPB 2g/100 mL premix        2 g 200 mL/hr over 30 Minutes Intravenous Every 8 hours 07/09/24 1734 07/16/24 1359   07/09/24 1500  vancomycin  (VANCOCIN ) IVPB 1000 mg/200 mL premix       Placed in Followed by Linked Group   1,000 mg 200 mL/hr over 60 Minutes Intravenous   Once 07/09/24 1406 07/09/24 1808   07/09/24 1415  vancomycin  (VANCOCIN ) IVPB 1000 mg/200 mL premix  Status:  Discontinued        1,000 mg 200 mL/hr over 60 Minutes Intravenous  Once 07/09/24 1404 07/09/24 1406   07/09/24 1415  ceFEPIme  (MAXIPIME ) 2 g in sodium chloride  0.9 % 100 mL IVPB        2 g 200 mL/hr over 30 Minutes Intravenous  Once 07/09/24 1404 07/09/24 1525   07/09/24 1415  vancomycin  (VANCOCIN ) IVPB 1000 mg/200 mL premix       Placed in Followed by Linked Group   1,000 mg 200 mL/hr over 60 Minutes Intravenous  Once 07/09/24 1406 07/09/24 1658       Subjective: C/o neck pain after eating due to his positioning  Objective: Vitals:   07/11/24 0351 07/11/24 0355 07/11/24 0428 07/11/24 0500  BP:  (!) 79/40 (!) 108/42   Pulse:  75    Resp: 18 18 20    Temp: (!) 97.4 F (36.3 C) (!) 97.4 F (36.3 C) (!) 97.3 F (36.3 C)   TempSrc: Oral Oral Oral   SpO2:  90% 98%   Weight:    135.6 kg  Height:        Intake/Output Summary (Last 24 hours) at 07/11/2024 9173 Last data filed at 07/10/2024 2316 Gross per  24 hour  Intake 390 ml  Output 1150 ml  Net -760 ml   Filed Weights   07/10/24 1219 07/11/24 0500  Weight: 136.1 kg 135.6 kg    Examination:  General: uncomfortable, c/o neck pain - sitting up on edge of bed Cardiovascular: RRR Lungs: difficult exam as he was moaning with discomfort, trying to get comfortable, some crackles noted at bases Neurological: Alert and oriented 3. Moves all extremities 4 with equal strength. Cranial nerves II through XII grossly intact. Extremities: bilateral LE swelling to thighs   Data Reviewed: I have personally reviewed following labs and imaging studies  CBC: Recent Labs  Lab 07/09/24 1230 07/09/24 1833 07/09/24 1936 07/09/24 2041 07/10/24 0325 07/11/24 0504  WBC 6.3  --   --   --   --  12.1*  NEUTROABS  --   --   --   --   --  9.7*  HGB 11.2* 12.2* 11.6* 11.6* 11.2* 10.9*  HCT 34.5* 36.0* 34.0* 34.0* 33.0* 33.5*   MCV 106.5*  --   --   --   --  108.1*  PLT 71*  --   --   --   --  88*    Basic Metabolic Panel: Recent Labs  Lab 07/09/24 1230 07/09/24 1833 07/09/24 1936 07/09/24 2041 07/10/24 0325 07/10/24 0514 07/11/24 0504  NA 140   < > 138 139 139 142 140  K 4.8   < > 4.4 4.4 4.3 4.5 4.7  CL 101  --   --   --   --  101 99  CO2 29  --   --   --   --  31 30  GLUCOSE 95  --   --   --   --  118* 105*  BUN 44*  --   --   --   --  41* 47*  CREATININE 1.98*  --   --   --   --  1.48* 1.46*  CALCIUM  9.2  --   --   --   --  9.3 9.2  MG  --   --   --   --   --   --  2.2  PHOS  --   --   --   --   --   --  3.3   < > = values in this interval not displayed.    GFR: Estimated Creatinine Clearance (by C-G formula based on SCr of 1.46 mg/dL (H)) Male: 49.8 mL/min (Seneca Gadbois) Male: 60.6 mL/min (Heba Ige)  Liver Function Tests: Recent Labs  Lab 07/09/24 1500 07/11/24 0504  AST 104* 115*  ALT 35 21  ALKPHOS 77 58  BILITOT 0.6 0.6  PROT 6.6 5.8*  ALBUMIN 3.7 3.3*    CBG: No results for input(s): GLUCAP in the last 168 hours.   Recent Results (from the past 240 hours)  Culture, blood (routine x 2)     Status: None (Preliminary result)   Collection Time: 07/09/24  2:40 PM   Specimen: BLOOD  Result Value Ref Range Status   Specimen Description   Final    BLOOD LEFT ANTECUBITAL Performed at [facility], [address] [place], [place] [address]    Special Requests   Final    BOTTLES DRAWN AEROBIC AND ANAEROBIC Blood Culture adequate volume Performed at [facility], [address], [place], [place] [address]    Culture   Final    NO GROWTH 2 DAYS Performed at [facility], [address] [place], [place] [address]    Report Status PENDING  Incomplete         Radiology Studies: ECHOCARDIOGRAM COMPLETE Result Date: 07/10/2024    ECHOCARDIOGRAM REPORT   Patient Name:   YAREL RUSHLOW Date of Exam: 07/10/2024 Medical Rec #:  [id number]    Height:       70.0 in Accession #:    [id number]  Weight:       300.0 lb Date of Birth:  Dec 17, 1950   BSA:          2.480 m Patient Age:    73 years    BP:           77/39 mmHg Patient Gender: M           HR:           100 bpm. Exam Location:  Inpatient Procedure: 2D Echo, Cardiac Doppler and Color Doppler (Both Spectral and Color            Flow Doppler were utilized during procedure). Indications:    CHF - Acute Diastolic  History:        Patient has prior history of Echocardiogram examinations, most                 recent 05/30/2023. CHF, CAD, Signs/Symptoms:Edema; Risk                 Factors:Dyslipidemia, Hypertension and Former Smoker.  Sonographer:    Juliene Rucks Referring Phys: [id number] KELLY Hiilei Gerst GRIFFITH  Sonographer Comments: Technically difficult study due to poor echo windows, no subcostal window and patient is obese. Image acquisition challenging due to patient body habitus. IMPRESSIONS  1. Left ventricular ejection fraction, by estimation, is 60 to 65%. The left ventricle has normal function. Left ventricular endocardial border not optimally defined to evaluate regional wall motion. There is mild concentric left ventricular hypertrophy. Indeterminate diastolic filling due to E-Natallia Stellmach fusion.  2. Right ventricular systolic function is normal. The right ventricular size is not well visualized. Tricuspid regurgitation signal is inadequate for assessing PA pressure.  3. Left atrial size was mildly dilated.  4. Right atrial size was mildly dilated.  5. The mitral valve is normal in structure. No evidence of mitral valve regurgitation. No evidence of mitral stenosis. Moderate mitral annular calcification.  6. The aortic valve was not well visualized. Aortic valve regurgitation is not visualized. No aortic stenosis is present.  7. Aortic dilatation noted. There is borderline dilatation of the aortic root, measuring 39 mm. FINDINGS  Left Ventricle: Left ventricular ejection fraction, by estimation, is 60 to 65%. The  left ventricle has normal function. Left ventricular endocardial border not optimally defined to evaluate regional wall motion. The left ventricular internal cavity size was normal in size. There is mild concentric left ventricular hypertrophy. Indeterminate diastolic filling due to E-Tanesha Arambula fusion. Right Ventricle: The right ventricular size is not well visualized. Right vetricular wall thickness was not well visualized. Right ventricular systolic function is normal. Tricuspid regurgitation signal is inadequate for assessing PA pressure. Left Atrium: Left atrial size was mildly dilated. Right Atrium: Right atrial size was mildly dilated. Pericardium: There is no evidence of pericardial effusion. Mitral Valve: The mitral valve is normal in structure. Moderate mitral annular calcification. No evidence of mitral valve regurgitation. No evidence of mitral valve stenosis. Tricuspid Valve: The tricuspid valve is not well visualized. Tricuspid valve regurgitation is not demonstrated. Aortic Valve: The aortic valve was not well visualized. Aortic  valve regurgitation is not visualized. No aortic stenosis is present. Pulmonic Valve: The pulmonic valve was not well visualized. Pulmonic valve regurgitation is not visualized. No evidence of pulmonic stenosis. Aorta: Aortic dilatation noted. There is borderline dilatation of the aortic root, measuring 39 mm. IAS/Shunts: The interatrial septum was not well visualized.  LEFT VENTRICLE PLAX 2D LVIDd:         4.90 cm LVIDs:         3.30 cm LV PW:         1.20 cm LV IVS:        1.30 cm LVOT diam:     2.30 cm LV SV:         70 LV SV Index:   28 LVOT Area:     4.15 cm  RIGHT VENTRICLE RV S prime:     14.50 cm/s TAPSE (M-mode): 1.8 cm LEFT ATRIUM             Index        RIGHT ATRIUM           Index LA diam:        3.50 cm 1.41 cm/m   RA Area:     23.38 cm 9.43 cm/m LA Vol (A2C):   70.5 ml 28.43 ml/m  RA Volume:   59.12 ml  23.84 ml/m LA Vol (A4C):   62.6 ml 25.24 ml/m LA Biplane  Vol: 67.5 ml 27.22 ml/m  AORTIC VALVE LVOT Vmax:   100.00 cm/s LVOT Vmean:  66.200 cm/s LVOT VTI:    0.169 m  AORTA Ao Root diam: 3.90 cm  SHUNTS Systemic VTI:  0.17 m Systemic Diam: 2.30 cm Jerel Croitoru MD Electronically signed by Jerel Balding MD Signature Date/Time: 07/10/2024/5:24:30 PM    Final    US  Venous Img Lower Right (DVT Study) Addendum Date: 07/10/2024 ADDENDUM REPORT: 07/10/2024 13:04 ADDENDUM: There is Anelisse Jacobson LEFT/RIGHT error in the report. The RIGHT lower extremity was scanned and not the left. The worksheet was mistakenly filled out as Gerardine Peltz left lower extremity, resulting in the LEFT/RIGHT transposition. The right lower extremity is negative for deep venous thrombosis. Electronically Signed   By: Marcey Moan M.D.   On: 07/10/2024 13:04   Result Date: 07/10/2024 CLINICAL DATA:  Left lower extremity edema. EXAM: LEFT LOWER EXTREMITY VENOUS DOPPLER ULTRASOUND TECHNIQUE: Gray-scale sonography with graded compression, as well as color Doppler and duplex ultrasound were performed to evaluate the lower extremity deep venous systems from the level of the common femoral vein and including the common femoral, femoral, profunda femoral, popliteal and calf veins including the posterior tibial, peroneal and gastrocnemius veins when visible. The superficial great saphenous vein was also interrogated. Spectral Doppler was utilized to evaluate flow at rest and with distal augmentation maneuvers in the common femoral, femoral and popliteal veins. COMPARISON:  None Available. FINDINGS: Contralateral Common Femoral Vein: Respiratory phasicity is normal and symmetric with the symptomatic side. No evidence of thrombus. Normal compressibility. Common Femoral Vein: No evidence of thrombus. Normal compressibility, respiratory phasicity and response to augmentation. Saphenofemoral Junction: No evidence of thrombus. Normal compressibility and flow on color Doppler imaging. Profunda Femoral Vein: No evidence of  thrombus. Normal compressibility and flow on color Doppler imaging. Femoral Vein: No evidence of thrombus. Normal compressibility, respiratory phasicity and response to augmentation. Popliteal Vein: No evidence of thrombus. Normal compressibility, respiratory phasicity and response to augmentation. Calf Veins: No evidence of thrombus. Normal compressibility and flow on color Doppler imaging. Superficial Great Saphenous Vein: No  evidence of thrombus. Normal compressibility. Venous Reflux:  None. Other Findings: No evidence of superficial thrombophlebitis or abnormal fluid collection. IMPRESSION: No evidence of left lower extremity deep venous thrombosis. Electronically Signed: By: Marcey Moan M.D. On: 07/09/2024 15:19   US  RENAL Result Date: 07/09/2024 EXAM: US  Retroperitoneum Complete, Renal. 07/09/2024 08:45:45 PM TECHNIQUE: Real-time ultrasonography of the kidneys was performed. COMPARISON: None available CLINICAL HISTORY: 409830 AKI (acute kidney injury) 409830 AKI (acute kidney injury) FINDINGS: FINDINGS: RIGHT KIDNEY/URETER: Right kidney measures 12.0 x 6.3 x 3.6 cm. Calculated volume is 142 ml. New right-sided hydronephrosis is noted of Brittiany Wiehe moderate degree. Normal cortical echogenicity. No calculus. No mass. LEFT KIDNEY/URETER: Left kidney measures 11.1 x 5.9 x 4.3 cm. The calculated volume is 147 ml. Normal cortical echogenicity. No hydronephrosis. No calculus. No mass. BLADDER: There is suggestion of an echogenic lesion in the anterior aspect of the bladder although incompletely evaluated on this exam. CT of the abdomen and pelvis may be helpful for further evaluation with delayed images through the bladder to assess for enhancement. IMPRESSION: 1. New moderate right-sided hydronephrosis, which may account for acute kidney injury in the appropriate clinical setting. 2. Echogenic lesion in the anterior bladder, incompletely evaluated; recommend CT abdomen and pelvis with delayed bladder imaging for  further characterization. Electronically signed by: Oneil Devonshire MD 07/09/2024 08:56 PM EST RP Workstation: [id number]   DG Chest Portable 1 View Result Date: 07/09/2024 EXAM: 1 VIEW(S) XRAY OF THE CHEST 07/09/2024 07:08:00 PM COMPARISON: 07/09/2024 CLINICAL HISTORY: SOB - worsening FINDINGS: LUNGS AND PLEURA: Chronic elevation of right hemidiaphragm. Diminished lung volumes, particularly on right side. Persistent hazy opacity at right lung base, may represent atelectasis or pneumonia. No pleural effusion. No pneumothorax. HEART AND MEDIASTINUM: No acute abnormality of the cardiac and mediastinal silhouettes. BONES AND SOFT TISSUES: No acute osseous abnormality. IMPRESSION: 1. Persistent hazy opacity at the right lung base concerning for pneumonia versus atelectasis. 2. Chronic elevation of the right hemidiaphragm with diminished right-sided lung volumes. Electronically signed by: Greig Pique MD 07/09/2024 07:25 PM EST RP Workstation: [id number]   DG Chest Portable 1 View Result Date: 07/09/2024 EXAM: 1 VIEW(S) XRAY OF THE CHEST 07/09/2024 12:40:20 PM COMPARISON: CT chest dated 06/29/2023. CLINICAL HISTORY: Shortness of breath with increased leg swelling and weakness. FINDINGS: LUNGS AND PLEURA: Shallow inspiration. Elevation of the right hemidiaphragm with interposition of bowel contents under the hemidiaphragm. Atelectasis in the right lung base with probable small pleural effusion. No pneumothorax. HEART AND MEDIASTINUM: Heart size is normal given the technique. No acute abnormality of the cardiac and mediastinal silhouettes. BONES AND SOFT TISSUES: Degenerative changes in the spine. IMPRESSION: 1. Atelectasis in the right lung base with probable small pleural effusion. 2. Elevation of the right hemidiaphragm with interposed bowel beneath the hemidiaphragm. Electronically signed by: Elsie Gravely MD 07/09/2024 01:15 PM EST RP Workstation: [id number]        Scheduled Meds:  apixaban   2.5 mg Oral  BID   folic acid   1 mg Oral Daily   furosemide   40 mg Intravenous BID   Gerhardt's butt cream   Topical Daily   lidocaine   1 patch Transdermal Q24H   melatonin  10 mg Oral QHS   tamsulosin   0.4 mg Oral Daily   Continuous Infusions:  sodium chloride  10 mL/hr at 07/11/24 9372    ceFAZolin  (ANCEF ) IV 2 g (07/11/24 0628)     LOS: 1 day    Time spent: over 30 min    Meliton Monte, MD [facility]  To contact the attending provider between 7A-7P or the covering provider during after hours 7P-7A, please log into the web site [email or web address] and access using universal [place] password for that web site. If you do not have the password, please call the hospital operator.  07/11/2024, 8:26 AM

## 2024-07-11 NOTE — Care Management Obs Status (Signed)
 MEDICARE OBSERVATION STATUS NOTIFICATION   Patient Details  Name: [name] MRN: [id number] Date of Birth: 12/16/1950   Medicare Observation Status Notification Given:       [name] 07/11/2024, 9:07 AM

## 2024-07-11 NOTE — Progress Notes (Signed)
 Rounding Note   Patient Name: [name] Date of Encounter: 07/11/2024  [facility] Cardiologist: Vina Gull, MD   Subjective No CP or dyspnea  Scheduled Meds:  apixaban   2.5 mg Oral BID   folic acid   1 mg Oral Daily   furosemide   40 mg Intravenous BID   Gerhardt's butt cream   Topical Daily   melatonin  10 mg Oral QHS   tamsulosin   0.4 mg Oral Daily   Continuous Infusions:  sodium chloride       ceFAZolin  (ANCEF ) IV 2 g (07/10/24 2134)   PRN Meds: sodium chloride , acetaminophen  **OR** acetaminophen , bisacodyl , diphenhydrAMINE , ondansetron  **OR** ondansetron  (ZOFRAN ) IV, oxyCODONE , senna-docusate   Vital Signs  Vitals:   07/11/24 0351 07/11/24 0355 07/11/24 0428 07/11/24 0500  BP:  (!) 79/40 (!) 108/42   Pulse:  75    Resp: 18 18 20    Temp: (!) 97.4 F (36.3 C) (!) 97.4 F (36.3 C) (!) 97.3 F (36.3 C)   TempSrc: Oral Oral Oral   SpO2:  90% 98%   Weight:    135.6 kg  Height:        Intake/Output Summary (Last 24 hours) at 07/11/2024 0611 Last data filed at 07/10/2024 2316 Gross per 24 hour  Intake 390 ml  Output 1550 ml  Net -1160 ml      07/11/2024    5:00 AM 07/10/2024   12:19 PM 07/05/2024    2:58 PM  Last 3 Weights  Weight (lbs) 298 lb 15.1 oz 300 lb 302 lb  Weight (kg) 135.6 kg 136.079 kg 136.986 kg      Telemetry Sinus - Personally Reviewed   Physical Exam  GEN: No acute distress.   Neck: supple Cardiac: RRR Respiratory: Clear to auscultation bilaterally. GI: Soft, nontender, non-distended  MS: 4+ edema on the right and 1+ on the left. Neuro:  Nonfocal  Psych: Normal affect   Labs  Recent Labs  Lab 07/09/24 1230 07/09/24 1500  TRNPT 169* 154*       Chemistry Recent Labs  Lab 07/09/24 1230 07/09/24 1500 07/09/24 1833 07/09/24 2041 07/10/24 0325 07/10/24 0514  NA 140  --    < > 139 139 142  K 4.8  --    < > 4.4 4.3 4.5  CL 101  --   --   --   --  101  CO2 29  --   --   --   --  31  GLUCOSE 95  --   --   --    --  118*  BUN 44*  --   --   --   --  41*  CREATININE 1.98*  --   --   --   --  1.48*  CALCIUM  9.2  --   --   --   --  9.3  PROT  --  6.6  --   --   --   --   ALBUMIN  --  3.7  --   --   --   --   AST  --  104*  --   --   --   --   ALT  --  35  --   --   --   --   ALKPHOS  --  77  --   --   --   --   BILITOT  --  0.6  --   --   --   --   GFRNONAA 35*  --   --   --   --  49*  ANIONGAP 10  --   --   --   --  9   < > = values in this interval not displayed.    Hematology Recent Labs  Lab 07/09/24 1230 07/09/24 1833 07/09/24 1936 07/09/24 2041 07/10/24 0325  WBC 6.3  --   --   --   --   RBC 3.24*  --   --   --   --   HGB 11.2*   < > 11.6* 11.6* 11.2*  HCT 34.5*   < > 34.0* 34.0* 33.0*  MCV 106.5*  --   --   --   --   MCH 34.6*  --   --   --   --   MCHC 32.5  --   --   --   --   RDW 13.9  --   --   --   --   PLT 71*  --   --   --   --    < > = values in this interval not displayed.    BNP Recent Labs  Lab 07/09/24 1230  PROBNP 909.0*      Radiology  ECHOCARDIOGRAM COMPLETE Result Date: 07/10/2024    ECHOCARDIOGRAM REPORT   Patient Name:   [name] Date of Exam: 07/10/2024 Medical Rec #:  [id number]   Height:       70.0 in Accession #:    [id number]  Weight:       300.0 lb Date of Birth:  1950-07-28   BSA:          2.480 m Patient Age:    73 years    BP:           77/39 mmHg Patient Gender: M           HR:           100 bpm. Exam Location:  Inpatient Procedure: 2D Echo, Cardiac Doppler and Color Doppler (Both Spectral and Color            Flow Doppler were utilized during procedure). Indications:    CHF - Acute Diastolic  History:        Patient has prior history of Echocardiogram examinations, most                 recent 05/30/2023. CHF, CAD, Signs/Symptoms:Edema; Risk                 Factors:Dyslipidemia, Hypertension and Former Smoker.  Sonographer:    Juliene Rucks Referring Phys: [id number] KELLY A GRIFFITH  Sonographer Comments: Technically difficult study due to poor echo  windows, no subcostal window and patient is obese. Image acquisition challenging due to patient body habitus. IMPRESSIONS  1. Left ventricular ejection fraction, by estimation, is 60 to 65%. The left ventricle has normal function. Left ventricular endocardial border not optimally defined to evaluate regional wall motion. There is mild concentric left ventricular hypertrophy. Indeterminate diastolic filling due to E-A fusion.  2. Right ventricular systolic function is normal. The right ventricular size is not well visualized. Tricuspid regurgitation signal is inadequate for assessing PA pressure.  3. Left atrial size was mildly dilated.  4. Right atrial size was mildly dilated.  5. The mitral valve is normal in structure. No evidence of mitral valve regurgitation. No evidence of mitral stenosis. Moderate mitral annular calcification.  6. The aortic valve was not well visualized. Aortic valve regurgitation is not visualized. No aortic stenosis is present.  7. Aortic dilatation noted.  There is borderline dilatation of the aortic root, measuring 39 mm. FINDINGS  Left Ventricle: Left ventricular ejection fraction, by estimation, is 60 to 65%. The left ventricle has normal function. Left ventricular endocardial border not optimally defined to evaluate regional wall motion. The left ventricular internal cavity size was normal in size. There is mild concentric left ventricular hypertrophy. Indeterminate diastolic filling due to E-A fusion. Right Ventricle: The right ventricular size is not well visualized. Right vetricular wall thickness was not well visualized. Right ventricular systolic function is normal. Tricuspid regurgitation signal is inadequate for assessing PA pressure. Left Atrium: Left atrial size was mildly dilated. Right Atrium: Right atrial size was mildly dilated. Pericardium: There is no evidence of pericardial effusion. Mitral Valve: The mitral valve is normal in structure. Moderate mitral annular  calcification. No evidence of mitral valve regurgitation. No evidence of mitral valve stenosis. Tricuspid Valve: The tricuspid valve is not well visualized. Tricuspid valve regurgitation is not demonstrated. Aortic Valve: The aortic valve was not well visualized. Aortic valve regurgitation is not visualized. No aortic stenosis is present. Pulmonic Valve: The pulmonic valve was not well visualized. Pulmonic valve regurgitation is not visualized. No evidence of pulmonic stenosis. Aorta: Aortic dilatation noted. There is borderline dilatation of the aortic root, measuring 39 mm. IAS/Shunts: The interatrial septum was not well visualized.  LEFT VENTRICLE PLAX 2D LVIDd:         4.90 cm LVIDs:         3.30 cm LV PW:         1.20 cm LV IVS:        1.30 cm LVOT diam:     2.30 cm LV SV:         70 LV SV Index:   28 LVOT Area:     4.15 cm  RIGHT VENTRICLE RV S prime:     14.50 cm/s TAPSE (M-mode): 1.8 cm LEFT ATRIUM             Index        RIGHT ATRIUM           Index LA diam:        3.50 cm 1.41 cm/m   RA Area:     23.38 cm 9.43 cm/m LA Vol (A2C):   70.5 ml 28.43 ml/m  RA Volume:   59.12 ml  23.84 ml/m LA Vol (A4C):   62.6 ml 25.24 ml/m LA Biplane Vol: 67.5 ml 27.22 ml/m  AORTIC VALVE LVOT Vmax:   100.00 cm/s LVOT Vmean:  66.200 cm/s LVOT VTI:    0.169 m  AORTA Ao Root diam: 3.90 cm  SHUNTS Systemic VTI:  0.17 m Systemic Diam: 2.30 cm Jerel Croitoru MD Electronically signed by Jerel Balding MD Signature Date/Time: 07/10/2024/5:24:30 PM    Final    US  Venous Img Lower Right (DVT Study) Addendum Date: 07/10/2024 ADDENDUM REPORT: 07/10/2024 13:04 ADDENDUM: There is a LEFT/RIGHT error in the report. The RIGHT lower extremity was scanned and not the left. The worksheet was mistakenly filled out as a left lower extremity, resulting in the LEFT/RIGHT transposition. The right lower extremity is negative for deep venous thrombosis. Electronically Signed   By: Marcey Moan M.D.   On: 07/10/2024 13:04   Result Date:  07/10/2024 CLINICAL DATA:  Left lower extremity edema. EXAM: LEFT LOWER EXTREMITY VENOUS DOPPLER ULTRASOUND TECHNIQUE: Gray-scale sonography with graded compression, as well as color Doppler and duplex ultrasound were performed to evaluate the lower extremity deep venous systems from the  level of the common femoral vein and including the common femoral, femoral, profunda femoral, popliteal and calf veins including the posterior tibial, peroneal and gastrocnemius veins when visible. The superficial great saphenous vein was also interrogated. Spectral Doppler was utilized to evaluate flow at rest and with distal augmentation maneuvers in the common femoral, femoral and popliteal veins. COMPARISON:  None Available. FINDINGS: Contralateral Common Femoral Vein: Respiratory phasicity is normal and symmetric with the symptomatic side. No evidence of thrombus. Normal compressibility. Common Femoral Vein: No evidence of thrombus. Normal compressibility, respiratory phasicity and response to augmentation. Saphenofemoral Junction: No evidence of thrombus. Normal compressibility and flow on color Doppler imaging. Profunda Femoral Vein: No evidence of thrombus. Normal compressibility and flow on color Doppler imaging. Femoral Vein: No evidence of thrombus. Normal compressibility, respiratory phasicity and response to augmentation. Popliteal Vein: No evidence of thrombus. Normal compressibility, respiratory phasicity and response to augmentation. Calf Veins: No evidence of thrombus. Normal compressibility and flow on color Doppler imaging. Superficial Great Saphenous Vein: No evidence of thrombus. Normal compressibility. Venous Reflux:  None. Other Findings: No evidence of superficial thrombophlebitis or abnormal fluid collection. IMPRESSION: No evidence of left lower extremity deep venous thrombosis. Electronically Signed: By: Marcey Moan M.D. On: 07/09/2024 15:19   US  RENAL Result Date: 07/09/2024 EXAM: US   Retroperitoneum Complete, Renal. 07/09/2024 08:45:45 PM TECHNIQUE: Real-time ultrasonography of the kidneys was performed. COMPARISON: None available CLINICAL HISTORY: [id number] AKI (acute kidney injury) 409830 AKI (acute kidney injury) FINDINGS: FINDINGS: RIGHT KIDNEY/URETER: Right kidney measures 12.0 x 6.3 x 3.6 cm. Calculated volume is 142 ml. New right-sided hydronephrosis is noted of a moderate degree. Normal cortical echogenicity. No calculus. No mass. LEFT KIDNEY/URETER: Left kidney measures 11.1 x 5.9 x 4.3 cm. The calculated volume is 147 ml. Normal cortical echogenicity. No hydronephrosis. No calculus. No mass. BLADDER: There is suggestion of an echogenic lesion in the anterior aspect of the bladder although incompletely evaluated on this exam. CT of the abdomen and pelvis may be helpful for further evaluation with delayed images through the bladder to assess for enhancement. IMPRESSION: 1. New moderate right-sided hydronephrosis, which may account for acute kidney injury in the appropriate clinical setting. 2. Echogenic lesion in the anterior bladder, incompletely evaluated; recommend CT abdomen and pelvis with delayed bladder imaging for further characterization. Electronically signed by: Oneil Devonshire MD 07/09/2024 08:56 PM EST RP Workstation: [id number]   DG Chest Portable 1 View Result Date: 07/09/2024 EXAM: 1 VIEW(S) XRAY OF THE CHEST 07/09/2024 07:08:00 PM COMPARISON: 07/09/2024 CLINICAL HISTORY: SOB - worsening FINDINGS: LUNGS AND PLEURA: Chronic elevation of right hemidiaphragm. Diminished lung volumes, particularly on right side. Persistent hazy opacity at right lung base, may represent atelectasis or pneumonia. No pleural effusion. No pneumothorax. HEART AND MEDIASTINUM: No acute abnormality of the cardiac and mediastinal silhouettes. BONES AND SOFT TISSUES: No acute osseous abnormality. IMPRESSION: 1. Persistent hazy opacity at the right lung base concerning for pneumonia versus atelectasis. 2.  Chronic elevation of the right hemidiaphragm with diminished right-sided lung volumes. Electronically signed by: Greig Pique MD 07/09/2024 07:25 PM EST RP Workstation: [id number]   DG Chest Portable 1 View Result Date: 07/09/2024 EXAM: 1 VIEW(S) XRAY OF THE CHEST 07/09/2024 12:40:20 PM COMPARISON: CT chest dated 06/29/2023. CLINICAL HISTORY: Shortness of breath with increased leg swelling and weakness. FINDINGS: LUNGS AND PLEURA: Shallow inspiration. Elevation of the right hemidiaphragm with interposition of bowel contents under the hemidiaphragm. Atelectasis in the right lung base with probable small pleural effusion. No pneumothorax. HEART AND MEDIASTINUM:  Heart size is normal given the technique. No acute abnormality of the cardiac and mediastinal silhouettes. BONES AND SOFT TISSUES: Degenerative changes in the spine. IMPRESSION: 1. Atelectasis in the right lung base with probable small pleural effusion. 2. Elevation of the right hemidiaphragm with interposed bowel beneath the hemidiaphragm. Electronically signed by: Elsie Gravely MD 07/09/2024 01:15 PM EST RP Workstation: [id number]     Patient Profile   73 year old male with past medical history of coronary artery disease, hypertension, DVT, inclusion body myositis, BPH, lymphedema for evaluation of acute CHF.  Pt has chronic bilateral lower extremity edema right greater than left.  However since August this has progressed.  Echocardiogram this admission shows ejection fraction 60 to 65%, mild left ventricular hypertrophy, poor visualization of the right ventricle, mild biatrial enlargement.  Assessment & Plan  1 acute diastolic CHF-I/O-2110. Patient has chronic bilateral lower extremity edema right greater than left which has progressed.  Clearly a component of lymphedema.  However on examination he also has presacral edema; proBNP mildly elevated at 909.  Echocardiogram shows preserved LV function in the right heart not well-visualized.  Will  continue present dose of Lasix  for now but need to follow renal function closely as creatinine is elevated compared to baseline.   2 acute kidney injury-follow renal function closely with diuresis.   3 history of coronary artery disease-agree with holding statin in the setting of elevated CK (though likely due to inclusion body myositis).  Not on aspirin  given need for chronic anticoagulation.   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  Ultrasound shows no DVT.   5 cellulitis-antibiotics per primary service. For questions or updates, please contact [facility] Please consult [email or web address] for contact info under       Signed, Redell Shallow, MD  07/11/2024, 6:11 AM

## 2024-07-11 NOTE — Progress Notes (Signed)
 Mobility Specialist Progress Note:    07/11/24 1454  Mobility  Activity Pivoted/transferred from chair to bed  Level of Assistance Moderate assist, patient does 50-74% (+2)  Assistive Device Other (Comment) (2HHA)  Distance Ambulated (ft) 3 ft  Activity Response Tolerated well  Mobility Referral Yes  Mobility visit 1 Mobility  Mobility Specialist Start Time (ACUTE ONLY) 1440  Mobility Specialist Stop Time (ACUTE ONLY) 1453  Mobility Specialist Time Calculation (min) (ACUTE ONLY) 13 min   RN requested assistance getting patient back to bed. Pt required ModA +2 w/ 2 person HHA to transfer. No c/o throughout. Was able to take a couple short steps towards the bed. Left in bed w/ call bell and personal belongings in reach. All needs met.   Thersia Minder Mobility Specialist  Please contact vis Secure Chat or  Rehab Office [phone]

## 2024-07-11 NOTE — Evaluation (Signed)
 Occupational Therapy Evaluation Patient Details Name: [name] MRN: [id number] DOB: 11-17-1950 Today's Date: 07/11/2024   History of Present Illness   73 yo M adm 07/09/24 with LB edema, SOB, orthopnea, CHF, RLE cellulitis, Rt hydronephrosis. PMHx: CAD, HTN, HLD, inclusion body myositis, DVT, chonic LB edema, paralyzed Rt hemidiaphragm     Clinical Impressions Pt admitted with the concerns above. Pt currently with functional limitations due to the deficits listed below (see OT Problem List). Prior to admit, pt was living at home completing all BADL at Mod I level until one week ago. Pt has since required increased assistance. Limited mobility at baseline although reports that he was able to walk short distance and used a WC at baseline.  Pt will benefit from acute skilled OT to increase their safety and independence with ADL and functional mobility for ADL to facilitate discharge. Patient will benefit from intensive inpatient follow-up therapy, >3 hours/day.        If plan is discharge home, recommend the following:   Two people to help with walking and/or transfers;Two people to help with bathing/dressing/bathroom     Functional Status Assessment   Patient has had a recent decline in their functional status and demonstrates the ability to make significant improvements in function in a reasonable and predictable amount of time.     Equipment Recommendations   None recommended by OT     Recommendations for Other Services   Rehab consult     Precautions/Restrictions   Precautions Precautions: Fall Recall of Precautions/Restrictions: Intact Restrictions Weight Bearing Restrictions Per Provider Order: No     Mobility Bed Mobility Overal bed mobility: Needs Assistance Bed Mobility: Sit to Sidelying, Rolling Rolling: Min assist       Sit to sidelying: Mod assist, +2 for physical assistance, +2 for safety/equipment General bed mobility comments: able to lower  onto elbow with ModAx2 to bring LE's onto EOB (R LE painful with movement) and adjust shoulders. Able to roll with MinA    Transfers Overall transfer level: Needs assistance Equipment used: Rolling walker (2 wheels) Transfers: Sit to/from Stand Sit to Stand: Mod assist, +2 physical assistance, +2 safety/equipment, From elevated surface           General transfer comment: ModAx2 from elevated bed height with assist to boost-up and steady. Able to then take side-steps towards [facility]      Balance Overall balance assessment: Needs assistance, Mild deficits observed, not formally tested Sitting-balance support: No upper extremity supported, Feet supported Sitting balance-Leahy Scale: Fair     Standing balance support: Bilateral upper extremity supported, During functional activity, Reliant on assistive device for balance Standing balance-Leahy Scale: Poor Standing balance comment: reliant on UE and external support      ADL either performed or assessed with clinical judgement   ADL Overall ADL's : Needs assistance/impaired     Grooming: Set up   Upper Body Bathing: Moderate assistance;Sitting   Lower Body Bathing: Total assistance;Sitting/lateral leans   Upper Body Dressing : Moderate assistance;Sitting   Lower Body Dressing: Total assistance;Sitting/lateral leans   Toilet Transfer: Moderate assistance;+2 for physical assistance;Cueing for safety;Stand-pivot;BSC/3in1;Rolling walker (2 wheels)   Toileting- Clothing Manipulation and Hygiene: Moderate assistance;+2 for physical assistance;Sitting/lateral lean;Sit to/from stand           Vision Baseline Vision/History: 1 Wears glasses Ability to See in Adequate Light: 0 Adequate Patient Visual Report: No change from baseline Vision Assessment?: Wears glasses for reading     Perception Perception: Not tested  Praxis Praxis: Not tested       Pertinent Vitals/Pain Pain Assessment Pain Assessment: Faces Faces  Pain Scale: Hurts little more Pain Location: R LE with touch and movement Pain Descriptors / Indicators: Discomfort Pain Intervention(s): Monitored during session, Limited activity within patient's tolerance     Extremity/Trunk Assessment Upper Extremity Assessment Upper Extremity Assessment: Right hand dominant;RUE deficits/detail;LUE deficits/detail RUE Deficits / Details: MMT: 3-/5 shoulder ranges, elbow flexion, wrist extension. Wrist rests in a flexed position although able to passively range. weak gross grasp with impaired finger mobility. RUE Coordination: decreased fine motor LUE Deficits / Details: MMT: 3+/5 shoulder/elbow ranges. Weak gross grasp with impaired finger mobility. Unable to flex all joints of 3rd digit.   Lower Extremity Assessment Lower Extremity Assessment: Defer to PT evaluation    Cervical / Trunk Assessment Cervical / Trunk Assessment: Other exceptions Cervical / Trunk Exceptions: increased body habitus   Communication Communication Communication: Impaired Factors Affecting Communication: Hearing impaired   Cognition Arousal: Alert Behavior During Therapy: WFL for tasks assessed/performed Cognition: No apparent impairments            Following commands: Intact       Cueing  General Comments   Cueing Techniques: Verbal cues;Tactile cues  BP 99/68 (78) seated EOB           Home Living Family/patient expects to be discharged to:: Private residence Living Arrangements: Children;Spouse/significant other (daughter, son-in-law) Available Help at Discharge: Family;Available 24 hours/day Type of Home: House Home Access: Level entry     Home Layout: One level     Bathroom Shower/Tub: Producer, Television/film/video: Standard     Home Equipment: Agricultural Consultant (2 wheels);Cane - single point;Wheelchair - power;Grab bars - tub/shower;Shower seat;Grab bars - toilet (adjustable bed)   Additional Comments: Pt/spouse live in basement with  ground level entry. Spouse uses WC for mobility      Prior Functioning/Environment Prior Level of Function : Needs assist    Mobility Comments: Independent to transfer to power WC up until 1 week prior. Could walk a short distance with a RW. Was needing increased assistance. ADLs Comments: Normally Ind, more assist in the past week    OT Problem List: Decreased strength;Decreased range of motion;Decreased activity tolerance;Impaired balance (sitting and/or standing);Decreased coordination;Impaired UE functional use   OT Treatment/Interventions: Self-care/ADL training;Therapeutic exercise;Energy conservation;DME and/or AE instruction;Manual therapy;Therapeutic activities;Patient/family education;Balance training      OT Goals(Current goals can be found in the care plan section)   Acute Rehab OT Goals Patient Stated Goal: to get back into bed OT Goal Formulation: With patient Time For Goal Achievement: 07/25/24 Potential to Achieve Goals: Good   OT Frequency:  Min 2X/week    Co-evaluation PT/OT/SLP Co-Evaluation/Treatment: Yes Reason for Co-Treatment: For patient/therapist safety;To address functional/ADL transfers PT goals addressed during session: Mobility/safety with mobility;Balance OT goals addressed during session: ADL's and self-care;Strengthening/ROM      AM-PAC OT 6 Clicks Daily Activity     Outcome Measure Help from another person eating meals?: None Help from another person taking care of personal grooming?: A Little Help from another person toileting, which includes using toliet, bedpan, or urinal?: A Lot Help from another person bathing (including washing, rinsing, drying)?: A Lot Help from another person to put on and taking off regular upper body clothing?: A Little Help from another person to put on and taking off regular lower body clothing?: Total 6 Click Score: 15   End of Session Equipment Utilized During Treatment: Rolling walker (  2 wheels);Gait  belt  Activity Tolerance: Patient tolerated treatment well Patient left: in bed;with call bell/phone within reach;with bed alarm set  OT Visit Diagnosis: Unsteadiness on feet (R26.81);Muscle weakness (generalized) (M62.81)                Time: 8996-8947 OT Time Calculation (min): 49 min Charges:  OT General Charges $OT Visit: 1 Visit OT Evaluation $OT Eval Moderate Complexity: 1 Mod OT Treatments $Self Care/Home Management : 8-22 mins  Leita Howell, OTR/L,CBIS  Supplemental OT - [facility] and [facility] Secure Chat Preferred    Angline Schweigert, Leita BIRCH 07/11/2024, 2:08 PM

## 2024-07-11 NOTE — Progress Notes (Signed)
 Inpatient Rehab Admissions Coordinator Note:   Per therapy recommendations patient was screened for CIR candidacy by Reche FORBES Lowers, PT. At this time, pt appears to be a potential candidate for CIR. I will place an order for rehab consult for full assessment, per our protocol.  Please contact me any with questions.SABRA Reche Lowers, PT, DPT [phone] 07/11/2024 1:26 PM

## 2024-07-12 ENCOUNTER — Inpatient Hospital Stay (HOSPITAL_COMMUNITY)

## 2024-07-12 DIAGNOSIS — I5033 Acute on chronic diastolic (congestive) heart failure: Secondary | ICD-10-CM | POA: Diagnosis not present

## 2024-07-12 DIAGNOSIS — Z8659 Personal history of other mental and behavioral disorders: Secondary | ICD-10-CM

## 2024-07-12 DIAGNOSIS — L309 Dermatitis, unspecified: Secondary | ICD-10-CM

## 2024-07-12 DIAGNOSIS — G9341 Metabolic encephalopathy: Secondary | ICD-10-CM

## 2024-07-12 DIAGNOSIS — R6521 Severe sepsis with septic shock: Secondary | ICD-10-CM

## 2024-07-12 DIAGNOSIS — J9622 Acute and chronic respiratory failure with hypercapnia: Secondary | ICD-10-CM

## 2024-07-12 DIAGNOSIS — A419 Sepsis, unspecified organism: Secondary | ICD-10-CM

## 2024-07-12 DIAGNOSIS — I5030 Unspecified diastolic (congestive) heart failure: Secondary | ICD-10-CM

## 2024-07-12 DIAGNOSIS — N1330 Unspecified hydronephrosis: Secondary | ICD-10-CM

## 2024-07-12 DIAGNOSIS — I110 Hypertensive heart disease with heart failure: Principal | ICD-10-CM

## 2024-07-12 DIAGNOSIS — Z87891 Personal history of nicotine dependence: Secondary | ICD-10-CM

## 2024-07-12 LAB — COMPREHENSIVE METABOLIC PANEL WITH GFR
ALT: 8 U/L (ref 0–44)
AST: 101 U/L — ABNORMAL HIGH (ref 15–41)
Albumin: 3 g/dL — ABNORMAL LOW (ref 3.5–5.0)
Alkaline Phosphatase: 55 U/L (ref 38–126)
Anion gap: 9 (ref 5–15)
BUN: 60 mg/dL — ABNORMAL HIGH (ref 8–23)
CO2: 30 mmol/L (ref 22–32)
Calcium: 8.7 mg/dL — ABNORMAL LOW (ref 8.9–10.3)
Chloride: 98 mmol/L (ref 98–111)
Creatinine, Ser: 1.85 mg/dL — ABNORMAL HIGH (ref 0.61–1.24)
GFR, Estimated: 38 mL/min — ABNORMAL LOW (ref >60)
Glucose, Bld: 112 mg/dL — ABNORMAL HIGH (ref 70–99)
Potassium: 4 mmol/L (ref 3.5–5.1)
Sodium: 137 mmol/L (ref 135–145)
Total Bilirubin: 0.4 mg/dL (ref 0.0–1.2)
Total Protein: 5 g/dL — ABNORMAL LOW (ref 6.5–8.1)

## 2024-07-12 LAB — CBC WITH DIFFERENTIAL/PLATELET
Abs Immature Granulocytes: 0.07 K/uL (ref 0.00–0.07)
Basophils Absolute: 0 K/uL (ref 0.0–0.1)
Basophils Relative: 0 %
Eosinophils Absolute: 0.1 K/uL (ref 0.0–0.5)
Eosinophils Relative: 1 %
HCT: 29.9 % — ABNORMAL LOW (ref 39.0–52.0)
Hemoglobin: 9.8 g/dL — ABNORMAL LOW (ref 13.0–17.0)
Immature Granulocytes: 1 %
Lymphocytes Relative: 10 %
Lymphs Abs: 0.9 K/uL (ref 0.7–4.0)
MCH: 35.8 pg — ABNORMAL HIGH (ref 26.0–34.0)
MCHC: 32.8 g/dL (ref 30.0–36.0)
MCV: 109.1 fL — ABNORMAL HIGH (ref 80.0–100.0)
Monocytes Absolute: 1 K/uL (ref 0.1–1.0)
Monocytes Relative: 12 %
Neutro Abs: 6.5 K/uL (ref 1.7–7.7)
Neutrophils Relative %: 76 %
Platelets: 74 K/uL — ABNORMAL LOW (ref 150–400)
RBC: 2.74 MIL/uL — ABNORMAL LOW (ref 4.22–5.81)
RDW: 14.4 % (ref 11.5–15.5)
WBC: 8.6 K/uL (ref 4.0–10.5)
nRBC: 0 % (ref 0.0–0.2)

## 2024-07-12 LAB — TECHNOLOGIST SMEAR REVIEW: Plt Morphology: NORMAL

## 2024-07-12 LAB — MRSA NEXT GEN BY PCR, NASAL: MRSA by PCR Next Gen: DETECTED — AB

## 2024-07-12 LAB — LACTIC ACID, PLASMA
Lactic Acid, Venous: 1 mmol/L (ref 0.5–1.9)
Lactic Acid, Venous: 1.1 mmol/L (ref 0.5–1.9)
Lactic Acid, Venous: 1.7 mmol/L (ref 0.5–1.9)

## 2024-07-12 LAB — BLOOD GAS, VENOUS
Acid-Base Excess: 6 mmol/L — ABNORMAL HIGH (ref 0.0–2.0)
Bicarbonate: 34.7 mmol/L — ABNORMAL HIGH (ref 20.0–28.0)
Drawn by: 8050
O2 Saturation: 33.5 %
Patient temperature: 35.1
pCO2, Ven: 63 mmHg — ABNORMAL HIGH (ref 44–60)
pH, Ven: 7.34 (ref 7.25–7.43)
pO2, Ven: <31 mmHg — CL (ref 32–45)

## 2024-07-12 LAB — URINALYSIS, ROUTINE W REFLEX MICROSCOPIC
Bacteria, UA: NONE SEEN
Bilirubin Urine: NEGATIVE
Glucose, UA: NEGATIVE mg/dL
Ketones, ur: NEGATIVE mg/dL
Nitrite: NEGATIVE
Protein, ur: 100 mg/dL — AB
RBC / HPF: >50 RBC/hpf (ref 0–5)
Specific Gravity, Urine: 1.017 (ref 1.005–1.030)
pH: 5 (ref 5.0–8.0)

## 2024-07-12 LAB — CORTISOL: Cortisol, Plasma: 11.9 ug/dL

## 2024-07-12 LAB — POCT I-STAT 7, (LYTES, BLD GAS, ICA,H+H)
Acid-Base Excess: 5 mmol/L — ABNORMAL HIGH (ref 0.0–2.0)
Bicarbonate: 32.6 mmol/L — ABNORMAL HIGH (ref 20.0–28.0)
Calcium, Ion: 1.23 mmol/L (ref 1.15–1.40)
HCT: 28 % — ABNORMAL LOW (ref 39.0–52.0)
Hemoglobin: 9.5 g/dL — ABNORMAL LOW (ref 13.0–17.0)
O2 Saturation: 92 %
Patient temperature: 97.6
Potassium: 4.2 mmol/L (ref 3.5–5.1)
Sodium: 135 mmol/L (ref 135–145)
TCO2: 34 mmol/L — ABNORMAL HIGH (ref 22–32)
pCO2 arterial: 59.9 mmHg — ABNORMAL HIGH (ref 32–48)
pH, Arterial: 7.341 — ABNORMAL LOW (ref 7.35–7.45)
pO2, Arterial: 69 mmHg — ABNORMAL LOW (ref 83–108)

## 2024-07-12 LAB — GLUCOSE, CAPILLARY
Glucose-Capillary: 102 mg/dL — ABNORMAL HIGH (ref 70–99)
Glucose-Capillary: 102 mg/dL — ABNORMAL HIGH (ref 70–99)
Glucose-Capillary: 126 mg/dL — ABNORMAL HIGH (ref 70–99)

## 2024-07-12 LAB — IRON AND TIBC
Iron: 110 ug/dL (ref 45–182)
Saturation Ratios: 51 % — ABNORMAL HIGH (ref 17.9–39.5)
TIBC: 214 ug/dL — ABNORMAL LOW (ref 250–450)
UIBC: 104 ug/dL

## 2024-07-12 LAB — LACTATE DEHYDROGENASE: LDH: 257 U/L — ABNORMAL HIGH (ref 105–235)

## 2024-07-12 LAB — TSH: TSH: 3.19 u[IU]/mL (ref 0.350–4.500)

## 2024-07-12 LAB — MAGNESIUM: Magnesium: 2.2 mg/dL (ref 1.7–2.4)

## 2024-07-12 LAB — ABO/RH: ABO/RH(D): AB POS

## 2024-07-12 LAB — HEMOGLOBIN AND HEMATOCRIT, BLOOD
HCT: 26.9 % — ABNORMAL LOW (ref 39.0–52.0)
HCT: 30.3 % — ABNORMAL LOW (ref 39.0–52.0)
Hemoglobin: 8.7 g/dL — ABNORMAL LOW (ref 13.0–17.0)
Hemoglobin: 9.8 g/dL — ABNORMAL LOW (ref 13.0–17.0)

## 2024-07-12 LAB — RETICULOCYTES
Immature Retic Fract: 16.9 % — ABNORMAL HIGH (ref 2.3–15.9)
RBC.: 2.45 MIL/uL — ABNORMAL LOW (ref 4.22–5.81)
Retic Count, Absolute: 53.9 K/uL (ref 19.0–186.0)
Retic Ct Pct: 2.2 % (ref 0.4–3.1)

## 2024-07-12 LAB — PREPARE RBC (CROSSMATCH)

## 2024-07-12 LAB — MYOGLOBIN, URINE: Myoglobin, Ur: 2 ng/mL (ref 0–13)

## 2024-07-12 LAB — PHOSPHORUS: Phosphorus: 4.7 mg/dL — ABNORMAL HIGH (ref 2.5–4.6)

## 2024-07-12 LAB — FOLATE: Folate: >20 ng/mL (ref >5.9)

## 2024-07-12 LAB — VITAMIN B12: Vitamin B-12: 608 pg/mL (ref 180–914)

## 2024-07-12 LAB — FERRITIN: Ferritin: 178 ng/mL (ref 24–336)

## 2024-07-12 LAB — T4, FREE: Free T4: 1.2 ng/dL (ref 0.80–2.00)

## 2024-07-12 LAB — CK: Total CK: 862 U/L — ABNORMAL HIGH (ref 49–397)

## 2024-07-12 MED ORDER — SODIUM CHLORIDE 0.9 % IV SOLN: 1.0000 g | INTRAVENOUS | Status: DC

## 2024-07-12 MED ORDER — NOREPINEPHRINE 4 MG/250ML-% IV SOLN
0.0000 ug/min | INTRAVENOUS | Status: DC
  Administered 2024-07-12: 17:00:00 6 ug/min via INTRAVENOUS
  Administered 2024-07-13: 10 ug/min via INTRAVENOUS
  Filled 2024-07-12 (×2): qty 250

## 2024-07-12 MED ORDER — HYDROMORPHONE HCL 1 MG/ML IJ SOLN: 0.5000 mg | INTRAMUSCULAR | Status: DC | PRN

## 2024-07-12 MED ORDER — PHENYLEPHRINE 80 MCG/ML (10ML) SYRINGE FOR IV PUSH (FOR BLOOD PRESSURE SUPPORT)
PREFILLED_SYRINGE | INTRAVENOUS | Status: AC
  Filled 2024-07-12: qty 10

## 2024-07-12 MED ORDER — VASOPRESSIN 20 UNITS/100 ML INFUSION FOR SHOCK
0.0000 [IU]/min | INTRAVENOUS | Status: DC
  Administered 2024-07-13: 0.03 [IU]/min via INTRAVENOUS
  Filled 2024-07-12 (×2): qty 100

## 2024-07-12 MED ORDER — SODIUM CHLORIDE 0.9 % IV SOLN: 2.0000 g | Freq: Two times a day (BID) | INTRAVENOUS | Status: DC

## 2024-07-12 MED ORDER — THIAMINE HCL 100 MG/ML IJ SOLN: 100.0000 mg | Freq: Every day | INTRAMUSCULAR | Status: DC

## 2024-07-12 MED ORDER — ADULT MULTIVITAMIN W/MINERALS CH
1.0000 | ORAL_TABLET | Freq: Every day | ORAL | Status: DC
  Administered 2024-07-12 – 2024-07-20 (×9): 1 via ORAL
  Filled 2024-07-12 (×9): qty 1

## 2024-07-12 MED ORDER — VANCOMYCIN HCL 2000 MG/400ML IV SOLN
2000.0000 mg | Freq: Once | INTRAVENOUS | Status: DC
  Filled 2024-07-12: qty 400

## 2024-07-12 MED ORDER — COSYNTROPIN 0.25 MG IJ SOLR
0.2500 mg | Freq: Once | INTRAMUSCULAR | Status: DC
  Filled 2024-07-12: qty 0.25

## 2024-07-12 MED ORDER — CHLORHEXIDINE GLUCONATE CLOTH 2 % EX PADS
6.0000 | MEDICATED_PAD | Freq: Every day | CUTANEOUS | Status: DC
  Administered 2024-07-12: 15:00:00 6 via TOPICAL

## 2024-07-12 MED ORDER — LACTATED RINGERS IV BOLUS
1000.0000 mL | Freq: Once | INTRAVENOUS | Status: AC
  Administered 2024-07-12: 17:00:00 1000 mL via INTRAVENOUS

## 2024-07-12 MED ORDER — LORAZEPAM 1 MG PO TABS: 1.0000 mg | ORAL_TABLET | ORAL | Status: DC | PRN

## 2024-07-12 MED ORDER — VANCOMYCIN HCL 2000 MG/400ML IV SOLN
2000.0000 mg | INTRAVENOUS | Status: AC
  Administered 2024-07-12: 18:00:00 2000 mg via INTRAVENOUS
  Filled 2024-07-12: qty 400

## 2024-07-12 MED ORDER — HYDROCORTISONE SOD SUC (PF) 100 MG IJ SOLR
100.0000 mg | Freq: Two times a day (BID) | INTRAMUSCULAR | Status: DC
  Administered 2024-07-12 – 2024-07-14 (×4): 100 mg via INTRAVENOUS
  Filled 2024-07-12 (×6): qty 2

## 2024-07-12 MED ORDER — VANCOMYCIN HCL IN DEXTROSE 1-5 GM/200ML-% IV SOLN: 1000.0000 mg | INTRAVENOUS | Status: DC

## 2024-07-12 MED ORDER — SODIUM CHLORIDE 0.9% IV SOLUTION: Freq: Once | INTRAVENOUS | Status: DC

## 2024-07-12 MED ORDER — THIAMINE MONONITRATE 100 MG PO TABS
100.0000 mg | ORAL_TABLET | Freq: Every day | ORAL | Status: DC
  Administered 2024-07-12 – 2024-07-13 (×2): 100 mg via ORAL
  Filled 2024-07-12 (×2): qty 1

## 2024-07-12 MED ORDER — SODIUM CHLORIDE 0.9 % IV SOLN
2.0000 g | Freq: Two times a day (BID) | INTRAVENOUS | Status: DC
  Administered 2024-07-12 – 2024-07-16 (×8): 2 g via INTRAVENOUS
  Filled 2024-07-12 (×8): qty 12.5

## 2024-07-12 MED ORDER — LORAZEPAM 2 MG/ML IJ SOLN: 1.0000 mg | INTRAMUSCULAR | Status: DC | PRN

## 2024-07-12 MED ORDER — FOLIC ACID 1 MG PO TABS: 1.0000 mg | ORAL_TABLET | Freq: Every day | ORAL | Status: DC

## 2024-07-12 NOTE — Significant Event (Signed)
 Rapid Response Event Note   Reason for Call :  hypotension  Initial Focused Assessment:  Patient is alert but mildly confused.  He has labored breathing, using accessory muscles.  He is lying in bed.  He is pale and cool to the touch.   Lung sounds decreased bases. Heart tones regular  BP 55/39  HR 90  RR 20  O2 sat 97% on 3L [place]     Interventions:  1L LR bolus 1645: Levophed  gtt started at 4mcg per MD order. Transferred to 3M12 via bed with daughter at bedside.    Plan of Care:     Event Summary:   MD Notified: Dr Perri Call Time: 1554 Arrival Time: 1558 End Time: 1715  Elvin [place], RN

## 2024-07-12 NOTE — Consult Note (Addendum)
 NAME:  [name], MRN:  [id number], DOB:  1951/05/03, LOS: 2 ADMISSION DATE:  07/09/2024, CONSULTATION DATE:  07/12/2024 REFERRING MD:  Dr.Powell Meliton Raddle., CHIEF COMPLAINT:  Septic Shock   History of Present Illness:  This 73 year old gentleman with a background history of coronary artery disease, heart failure with preserved ejection fraction, hypertension, inclusion body myositis, DVT chronic bilateral venous eczema presents to us  with worsening of lower extremity swelling and orthopnea.  Patient was very drowsy initially.  He was being diuresed outpatient by cardiology for his heart failure and edema.  Patient has a persistent chronic orthopnea and sits up and sleeps.  Patient presents with lethargy and found to have cellulitis of his right lower extremity.  He was being given IV antibiotics however he is hypotensive and hypothermic.  ICU was called to see this patient to evaluate for the need of vasopressor therapy.  Patient when I went to see him was very lethargic MAP was 45.  The first liter of IV bolus was going on.  He denies any chest pain shortness of breath nausea vomiting.  He is able to maintain his airway.  He was hypothermic and is being rewarmed.  As soon as the patient came to ICU first liter of IV fluid is in patient is being rewarmed and Levophed  is started at 6 mcg/min.  His maps up to 94.  Will admit the patient to ICU for septic shock due to cellulitis.  Pertinent  Medical History   Past Medical History:  Diagnosis Date   Adenomatous colon polyp - diminutive 06/2019   no recall needed   Allergy sinus   hay fever   Bilateral lower extremity edema    BPH with lower urinary tract symptoms without urinary obstruction    Cataract    Coronary artery disease    a. s/p BMS to LAD (1998) b. s/p DES to RCA (06/24/14)   DVT (deep venous thrombosis) (HCC)    12/2020 L femoral and poplitea.  Unprovoked.  hypercoag w/u NEG.   Eliquis .   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 ?inclusion body myositis? Power WC as of 2022   Macrocytic anemia    Unknown etiology (bone marrow biopsy benign 2025)   Osteoarthritis, multiple sites    Palatal fistula    Palpable abd. aorta    Aortic u/s NO ANEURISM 2020   RBBB    Spinal stenosis    Dr Royal Bury  surgery   Thrombocytopenia    Uknown etiology (Bone marrow biopsy benign 2025)     Significant Hospital Events: Including procedures, antibiotic start and stop dates in addition to other pertinent events   07/12/2024 - Moved to ICU for septic shock needing levophed .  Interim History / Subjective:  Seen the patient in the 6E03 - Patient hypotensive, alert but not oriented. 1 L LR bolus is going on. Denies any chest pain, shortness of breath.  Objective    Blood pressure (!) 76/30, pulse 91, temperature (!) 95.9 F (35.5 C), temperature source Rectal, resp. rate 18, height 5' 10 (1.778 m), weight (!) 136.6 kg, SpO2 98%.  FiO2 (%):  [30 %] 30 % PEEP:  [5 cmH20] 5 cmH20 Pressure Support:  [10 cmH20] 10 cmH20   Intake/Output Summary (Last 24 hours) at 07/12/2024 1652 Last data filed at 07/12/2024 1544 Gross per 24 hour  Intake 965.61 ml  Output 1150 ml  Net -184.39 ml   Filed Weights   07/10/24 1219 07/11/24 0500 07/12/24 0602  Weight: 136.1 kg 135.6 kg (!) 136.6 kg    Examination: Physical exam: General: Chronically ill-appearing male, lying on the bed HEENT: [place]/AT, eyes anicteric.  moist mucus membranes Neuro: Alert, awake following commands. Not oriented. Confused. Chest: Coarse breath sounds, no wheezes or rhonchi Heart: Regular rate and rhythm, no murmurs or gallops Abdomen: Soft, nontender, nondistended, bowel  sounds present Extremiteis: Bilateral pedal edema, venous eczema and right lower extremity in wrap.   Resolved problem list   Assessment and Plan   Septic Shock Hypothermia Cellulitis Right Lower extremity Chronic Venous Eczema  - On Levophed  6 mcg/min. -Stress dose steroids for now de-escalate as able. - 2 Liters IV fluids. - Maps improved significantly on Levophed .  Will monitor with peripheral access. - Check lactic acid and trend it. - Blood cultures from 07/09/2024 no growth to date.   Acute Metabolic Encephalopathy  -This is improving as the patient is being given IV fluids and Levophed . - Likely due to sepsis. - Patient is maintaining airway will monitor.    H/O Chronic Paralysed R Hemidiapghragm Chronic Orthopnea/Hypercapnia - likely osa, improved with bpap Acute on Chronic Respiratory Acidosis - improved with NIV  -Currently on 3 L nasal cannula - BiPAP at night  HFpEF H/o Hypertension   -On 12/16 shows EF of 60 to 65% with normal RVSP. - Hold home dose torsemide . - Cardiology following - Hold home dose lisinopril   AKI Moderate right Hydronephrosis History of BPH  - Creatinine went up from 1.46-1.85 - 2 L IV fluids today - Monitor urine output electrolytes and creatinine daily - Hold home dose Lasix  diuretics and lisinopril  - On tamsulosin   H/o DVT   -Continue home dose apixaban    H/o Inclusion body Myositis H/o Alcohol use disorder  -CIWA monitoring use phenobarb if needed currently scores are 0 - Thiamine  folate  Spoke with the daughter and updated Full Code On Eliquis  Okay to allow diet    Labs   CBC: Recent Labs  Lab 07/09/24 1230 07/09/24 1833 07/09/24 2041 07/10/24 0325 07/11/24 0504 07/12/24 0421 07/12/24 1332  WBC 6.3  --   --   --  12.1* 8.6  --   NEUTROABS  --   --   --   --  9.7* 6.5  --   HGB 11.2*   < > 11.6* 11.2* 10.9* 9.8* 8.7*  HCT 34.5*   < > 34.0* 33.0* 33.5* 29.9* 26.9*  MCV 106.5*  --   --   --   108.1* 109.1*  --   PLT 71*  --   --   --  88* 74*  --    < > = values in this interval not displayed.    Basic Metabolic Panel: Recent Labs  Lab 07/09/24 1230 07/09/24 1833 07/09/24 2041 07/10/24 0325 07/10/24 0514 07/11/24 0504 07/12/24 0421  NA 140   < > 139 139 142 140 137  K 4.8   < > 4.4 4.3 4.5 4.7 4.0  CL 101  --   --   --  101 99 98  CO2 29  --   --   --  31 30 30   GLUCOSE 95  --   --   --  118* 105* 112*  BUN 44*  --   --   --  41* 47* 60*  CREATININE 1.98*  --   --   --  1.48* 1.46* 1.85*  CALCIUM  9.2  --   --   --  9.3 9.2 8.7*  MG  --   --   --   --   --  2.2 2.2  PHOS  --   --   --   --   --  3.3 4.7*   < > = values in this interval not displayed.   GFR: Estimated Creatinine Clearance (by C-G formula based on SCr of 1.85 mg/dL (H)) Male: 60.2 mL/min (A) Male: 48 mL/min (A) Recent Labs  Lab 07/09/24 1230 07/11/24 0504 07/12/24 0421 07/12/24 1202  WBC 6.3 12.1* 8.6  --   LATICACIDVEN  --   --   --  1.0    Liver Function Tests: Recent Labs  Lab 07/09/24 1500 07/11/24 0504 07/12/24 0421  AST 104* 115* 101*  ALT 35 21 8  ALKPHOS 77 58 55  BILITOT 0.6 0.6 0.4  PROT 6.6 5.8* 5.0*  ALBUMIN 3.7 3.3* 3.0*   No results for input(s): LIPASE, AMYLASE in the last 168 hours. No results for input(s): AMMONIA in the last 168 hours.  ABG    Component Value Date/Time   HCO3 34.7 (H) 07/12/2024 0714   TCO2 33 (H) 07/10/2024 0325   O2SAT 33.5 07/12/2024 0714     Coagulation Profile: No results for input(s): INR, PROTIME in the last 168 hours.  Cardiac Enzymes: Recent Labs  Lab 07/09/24 1230 07/10/24 0514 07/11/24 0504 07/12/24 0421  CKTOTAL 1,802* 943* 1,305* 862*    HbA1C: Hgb A1c MFr Bld  Date/Time Value Ref Range Status  05/05/2023 03:14 PM 5.3 4.8 - 5.6 % Final    Comment:             Prediabetes: 5.7 - 6.4          Diabetes: >6.4          Glycemic control for adults with diabetes: <7.0   04/05/2022 12:25 PM 5.2 4.8 - 5.6  % Final    Comment:             Prediabetes: 5.7 - 6.4          Diabetes: >6.4          Glycemic control for adults with diabetes: <7.0     CBG: No results for input(s): GLUCAP in the last 168 hours.  Review of Systems:   12 point review systems is done which is negative for anything else other than what is mentioned in the HPI.  Past Medical History:  He,  has a past medical history of Adenomatous colon polyp - diminutive (06/2019), Allergy (sinus), Bilateral lower extremity edema, BPH with lower urinary tract symptoms without urinary obstruction, Cataract, Coronary artery disease, DVT (deep venous thrombosis) (HCC), Dyspnea, Fungal infection of lung, Glaucoma, Hay fever, Hemidiaphragm paralysis, HTN (hypertension), Hyperlipidemia, Inclusion body myositis (HCC), Macrocytic anemia, Osteoarthritis, multiple sites, Palatal fistula, Palpable abd. aorta, RBBB, Spinal stenosis, and Thrombocytopenia.   Surgical History:   Past Surgical History:  Procedure Laterality Date   APPENDECTOMY  2010   BONE MARROW BIOPSY     2025 benign   CERVICAL FUSION  09/2009   CHOLECYSTECTOMY  1998   COLONOSCOPY     2020-->no recall  EYE SURGERY     Lasik   HYDROCELE EXCISION  2005   JOINT REPLACEMENT  05/22/20   right hip   LEFT HEART CATHETERIZATION WITH CORONARY ANGIOGRAM N/A 06/24/2014   Procedure: LEFT HEART CATHETERIZATION WITH CORONARY ANGIOGRAM;  Surgeon: Debby DELENA Sor, MD;  Location[facility] CATH LAB;  Service: Cardiovascular;  Laterality: N/A;   LUMBAR SPINE SURGERY  11/2011   MUSCLE BIOPSY Left 07/11/2018   Procedure: Left Vastus lateralus muscle biopsy;  Surgeon: Cheryle Debby DELENA, MD;  Location: [facility] OR;  Service: Neurosurgery;  Laterality: Left;  Left Vastus lateralus muscle biopsy   PERCUTANEOUS CORONARY STENT INTERVENTION (PCI-S)  06/24/2014   DES to RCA. Procedure: PERCUTANEOUS CORONARY STENT INTERVENTION (PCI-S);  Surgeon: Debby DELENA Sor, MD;  Location: [facility] CATH LAB;  Service:  Cardiovascular;;  Distal RCA   Rhythm monitoring     04/2023, occ pvc's   SPINE SURGERY     TONSILLECTOMY AND ADENOIDECTOMY  2012   TOTAL HIP ARTHROPLASTY Right 2021   RIGHT   TRANSTHORACIC ECHOCARDIOGRAM     03/2019 normal.  Nov 2024 mild RV dysf, o/w normal     Social History:   reports that he quit smoking about 34 years ago. His smoking use included cigarettes. He started smoking about 58 years ago. He has a 17.3 pack-year smoking history. He has never used smokeless tobacco. He reports current alcohol use. He reports that he does not use drugs.   Family History:  His family history includes Aneurysm in his father; Cancer in his mother; Cystic fibrosis in his brother; Early death in his brother and father; Esophageal cancer in his mother; Heart attack in his mother; Heart disease in his father. There is no history of Stroke.   Allergies Allergies[1]   Home Medications  Prior to Admission medications  Medication Sig Start Date End Date Taking? Authorizing Provider  acetaminophen  (TYLENOL ) 500 MG tablet Take 1,000 mg by mouth daily.   Yes [provider]  apixaban  (ELIQUIS ) 2.5 MG TABS tablet Take 1 tablet (2.5 mg total) by mouth 2 (two) times daily. 07/04/23  Yes Okey Vina GAILS, MD  atenolol  (TENORMIN ) 25 MG tablet Take 1 tablet (25 mg total) by mouth 2 (two) times daily. 05/09/24  Yes Ross, Paula V, MD  b complex vitamins tablet Take 1 tablet by mouth every evening.   Yes [provider]  Cholecalciferol (VITAMIN D-3 PO) Take 1 capsule by mouth daily.   Yes [provider]  Coenzyme Q10 (COQ-10 PO) Take 1 capsule by mouth every evening.   Yes [provider]  ezetimibe  (ZETIA ) 10 MG tablet Take 1 tablet (10 mg total) by mouth daily. Patient taking differently: Take 10 mg by mouth every evening. 05/21/24  Yes Conte, Tessa N, PA-C  FOLIC ACID  PO Take 1 tablet by mouth daily.   Yes [provider]  ibuprofen (ADVIL,MOTRIN) 800 MG tablet Take  800 mg by mouth daily.    Yes [provider]  KRILL OIL PO Take 1 capsule by mouth daily.   Yes [provider]  levofloxacin  (LEVAQUIN ) 750 MG tablet Take 750 mg by mouth daily.   Yes [provider]  lisinopril  (ZESTRIL ) 10 MG tablet Take 1 tablet (10 mg total) by mouth daily. Patient taking differently: Take 10 mg by mouth every evening. 05/21/24  Yes Conte, Tessa N, PA-C  potassium chloride  (KLOR-CON ) 10 MEQ tablet Increase dose to 2 tablets by mouth daily for 3 days then 1 tablet daily for 1  week then resume taking 1 tablet (10 mEq total) by mouth every other day. 04/16/24  Yes Conte, Tessa N, PA-C  rosuvastatin  (CRESTOR ) 5 MG tablet Take 1 tablet (5 mg total) by mouth daily. Patient taking differently: Take 5 mg by mouth every evening. 06/18/24  Yes Okey Vina GAILS, MD  tamsulosin  (FLOMAX ) 0.4 MG CAPS capsule Take 1 capsule (0.4 mg total) by mouth daily. Patient taking differently: Take 0.4 mg by mouth every evening. 04/24/24  Yes McGowen, Aleene DEL, MD  torsemide  (DEMADEX ) 20 MG tablet Take 1 tablet (20 mg total) by mouth daily. Patient taking differently: Take 40 mg by mouth daily. 07/05/24  Yes Okey Vina GAILS, MD  eszopiclone  3 MG TABS Take 1 tablet (3 mg total) by mouth at bedtime. Take immediately before bedtime Patient not taking: Reported on 07/05/2024 04/24/24   McGowen, Philip H, MD  levocetirizine (XYZAL  ALLERGY 24HR) 5 MG tablet Take 1 tablet (5 mg total) by mouth every evening. Patient not taking: Reported on 07/10/2024 04/19/24   Soldatova, [place], MD     Critical care time: 40 Minutes.    Tamela Stakes, MD  Attending Physician, Critical Care Medicine [facility] See Amion for pager If no response to pager, please call [phone] until 7pm After 7pm, Please call E-link [phone]           [1] No Known Allergies

## 2024-07-12 NOTE — Plan of Care (Signed)
°  Problem: Clinical Measurements: Goal: Ability to avoid or minimize complications of infection will improve Outcome: Progressing   Problem: Skin Integrity: Goal: Skin integrity will improve Outcome: Progressing   Problem: Education: Goal: Ability to demonstrate management of disease process will improve Outcome: Progressing Goal: Ability to verbalize understanding of medication therapies will improve Outcome: Progressing   Problem: Activity: Goal: Capacity to carry out activities will improve Outcome: Progressing   Problem: Cardiac: Goal: Ability to achieve and maintain adequate cardiopulmonary perfusion will improve Outcome: Progressing

## 2024-07-12 NOTE — Progress Notes (Signed)
 [facility] night cross cover note:   Regarding this patient with reported history of cervical spinal stenosis, he notes neck pain refractory to prn oxycodone .  I subsequently ordered prn IV Dilaudid  for refractory pain.     Eva Pore, DO Hospitalist

## 2024-07-12 NOTE — Progress Notes (Signed)
°  Progress Note  Patient Name: [name] Date of Encounter: 07/12/2024 [name] Cardiologist: Vina Gull, MD   Interval Summary   Deteriorating medical status this afternoon.  This morning had lower urine output.  Now has developed hypotension and hypothermia.  He remains conversant and oriented, but looks a little groggy.  Denies angina and dyspnea. Renal function which showed initial improvement after the first 24 hours since admission is deteriorating. Transferring to ICU.  Vital Signs Vitals:   07/12/24 1620 07/12/24 1622 07/12/24 1624 07/12/24 1626  BP: (!) 76/46 (!) 97/37 (!) 86/41 (!) 76/30  Pulse: 94 95 89 91  Resp:      Temp:      TempSrc:      SpO2: 98% 100% 98% 98%  Weight:      Height:        Intake/Output Summary (Last 24 hours) at 07/12/2024 1636 Last data filed at 07/12/2024 1544 Gross per 24 hour  Intake 965.61 ml  Output 1150 ml  Net -184.39 ml      07/12/2024    6:02 AM 07/11/2024    5:00 AM 07/10/2024   12:19 PM  Last 3 Weights  Weight (lbs) 301 lb 2.4 oz 298 lb 15.1 oz 300 lb  Weight (kg) 136.6 kg 135.6 kg 136.079 kg      Telemetry/ECG  Sinus rhythm with occasional monomorphic PVCs- Personally Reviewed  Physical Exam  GEN: No acute distress.  Looks slightly groggy/fuzzy.  Does not appear to have labored breathing. Neck: Difficult to evaluate JVD Cardiac: Rare ectopy on a background of RRR, no murmurs, rubs, or gallops.  Respiratory: Clear to auscultation bilaterally. GI: Soft, nontender, non-distended  MS: Right lower extremity wrapped in elastic bandage, substantially more edematous compared to the left.  Multiple skin ulcerations.  Assessment & Plan  73 year old male with past medical history of coronary artery disease, hypertension, DVT, inclusion body myositis, BPH, lymphedema for evaluation of acute CHF.  Pt has chronic bilateral lower extremity edema right greater than left.  However since August this has progressed.   Echocardiogram this admission shows ejection fraction 60 to 65%, mild left ventricular hypertrophy, poor visualization of the right ventricle, mild biatrial enlargement.   This afternoon he has developed hypotension, poor urine output, hypothermia.  Concern for sepsis.  He is transferring to the intensive care unit.  Last blood pressure 77/40 mmHg.  Remains in sinus rhythm.  Will start intravenous norepinephrine .  Hold diuretics.   ADDENDUM 1806H Seen after transfer to MICU.  Appears markedly improved, more alert, asking for coffee with 2 creamers and sugar.  Blood pressure improved.   For questions or updates, please contact Laird [name] Please consult [email or web address] for contact info under        CRITICAL CARE TIME Performed by: Jerel Balding, MD   Total critical care time: 30 minutes  Critical care time was exclusive of separately billable procedures and treating other patients.  Critical care was necessary to treat or prevent imminent or life-threatening deterioration.  Critical care was time spent personally by me on the following activities: development of treatment plan with patient and/or surrogate as well as nursing, discussions with consultants, evaluation of patient's response to treatment, examination of patient, obtaining history from patient or surrogate, ordering and performing treatments and interventions, ordering and review of laboratory studies, ordering and review of radiographic studies, pulse oximetry and re-evaluation of patient's condition. Signed, Jerel Balding, MD

## 2024-07-12 NOTE — Progress Notes (Signed)
 [facility] night cross cover note:   I was notified by the patient's RN that the patient is mildly confused this morning but remains alert and oriented x 4.  Patient is reported to have complete PT consumes 2-3 vodka beverages per day as an outpatient, and RN conveys that the patient refused his overnight BiPAP this evening.  In light of the above, I ordered a VBG to be checked now, and have also initiated CIWA protocol.      Eva Pore, DO Hospitalist

## 2024-07-12 NOTE — Progress Notes (Signed)
° °  Inpatient Rehabilitation Admissions Coordinator   Briefly met at bedside while undergoing Nursing care. Patient currently not at a level to pursue CIR admit. I will follow his progress.  Heron Leavell, RN, MSN Rehab Admissions Coordinator [phone] 07/12/2024 11:57 AM

## 2024-07-12 NOTE — Progress Notes (Signed)
 [name] [id number] Admission Data: 07/12/2024 6:58 PM Attending Provider: Gatha Pence, MD  ERE:FrHntzw, Aleene DEL, MD Consults/ Treatment Team:   [name] is a 73 y.o. adult patient admitted from 6N awake, alert  & orientated  X 3,  Full Code, VSS - Blood pressure (!) 118/92, pulse (!) 104, temperature 97.6 F (36.4 C), temperature source Oral, resp. rate (!) 32, height 5' 10 (1.778 m), weight 132.8 kg, SpO2 98%., O2 4 L nasal cannular, no c/o shortness of breath, no c/o chest pain, no distress noted. Tele # [id number] placed and pt is currently running:normal sinus rhythm. Admitted to 66M room 12.   Will cont to monitor and assist as needed.  [name], [name] 07/12/2024 6:58 PM

## 2024-07-12 NOTE — Progress Notes (Addendum)
 Pharmacy Antibiotic Note  [name] is a 73 y.o. adult admitted on 07/09/2024 with cellulitis.  Pharmacy has been consulted for vancomycin  and cefepime  dosing.  Given vancomycin  2000 mg x1 on 12/15, then continued with cefazolin . Clinically worsening renal function and now hypothermic. Broading coverage for suspected sepsis.  Plan: Vancomycin  2000 mg IV x1, followed by vancomycin  1000 mg IV Q24h (eAUC ~430, goal AUC 400-550, Scr 1.85) Cefepime  2g IV Q12h Trend WBC, fever, renal function F/u cultures, clinical progress, levels as indicated De-escalate when able   Height: 5' 10 (177.8 cm) Weight: (!) 136.6 kg (301 lb 2.4 oz) IBW/kg (Calculated) : 73  Temp (24hrs), Avg:96 F (35.6 C), Min:95.1 F (35.1 C), Max:97.5 F (36.4 C)  Recent Labs  Lab 07/09/24 1230 07/10/24 0514 07/11/24 0504 07/12/24 0421 07/12/24 1202  WBC 6.3  --  12.1* 8.6  --   CREATININE 1.98* 1.48* 1.46* 1.85*  --   LATICACIDVEN  --   --   --   --  1.0    Estimated Creatinine Clearance (by C-G formula based on SCr of 1.85 mg/dL (H)) Male: 60.2 mL/min (A) Male: 48 mL/min (A)    Allergies[1]   Thank you for allowing pharmacy to be a part of this patients care.  Shelba Collier, PharmD, BCPS Clinical Pharmacist     [1] No Known Allergies

## 2024-07-12 NOTE — Progress Notes (Signed)
 Patient placed on BIPAP per MD request. Will monitor patient. Vitals stable at this time. RN at bedside

## 2024-07-12 NOTE — Progress Notes (Addendum)
 PROGRESS NOTE    [name]  FMW:[id number] DOB: 16-Apr-1951 DOA: 07/09/2024 PCP: [name] Aleene DEL, MD  Chief Complaint  Patient presents with   Leg Swelling    Brief Narrative:   [name] is [name] 73 y.o. 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    Patient very drowsy during virtual admission encounter and also hard of hearing therefore his daughter at bedside assisted with providing history.  Patient had been seen by cardiology on Thursday (12/11) due to his worsening leg swelling.  His oral diuretic regimen was increased but apparently without improvement, swelling continue to worsen and patient began complaining of more shortness of breath, dyspnea on exertion and orthopnea.   He's been admitted with [name] heart failure exacerbation and concern for right lower extremity cellulitis.    Assessment & Plan:   Principal Problem:   Acute on chronic heart failure with preserved ejection fraction (HFpEF) (HCC) Active Problems:   Cellulitis of right lower extremity   AKI (acute kidney injury)   Coronary artery disease   HTN (hypertension)   Bilateral lower extremity edema   Elevated CK   Myositis   Hyperlipidemia   Weakness   Chronic radicular lumbar pain   History of DVT (deep vein thrombosis)  4:33 PM Addendum Hypotension: developed hypotension shortly after latest update.  SBP as low as 50's.  1 L bolus.  1 unit pRBC in setting of falling hb.  Broad spectrum abx.  Stress dose steroids.  PCCM consulted.  Discussed transfusion risks/benefits with wife, agreeable.  Discussed ICU transfer.  Confirmed full code.    Hypothermia Unclear cause.  Doubt this is due to cellulitis based on  exam.   TSH wnl.  AM cortisol not suggestive of adrenal insufficiency. Follow free T4. Follow CXR Bear hugger Follow temperature, additional workup as indicated. 3:49 PM addendum: temp slowly improving, unclear cause - follow CT chest and R leg.  CT renal stone protocol done yesterday unrevealing.  Acute Metabolic Encephalopathy Delirium precautions Started on CIWA due to drinking hx (doubt this represents withdrawal, he's lethargic without having any meds) TSH wnl, check b12, folate VBG with normal pH, respiratory acidosis - would be surprised if this were causing his lethargy, but wasn't on bipap last night and notably increased WOB today.  Will trial on some bipap.    Acute on chronic heart failure with preserved ejection fraction (HFpEF) (HCC) Patient presenting with progressively worsening swelling, dyspnea worse on exertion, orthopnea with progressive symptoms despite increase in oral diuretics just 4 days ago as outpatient with cardiology.   Last echo in November 2024 was poor acoustic windows and limited but showed normal EF 55 to 60%, no LV regional WMA's, mild LVH, mild MR. Will hold lasix  today with bump in creatinine, appreciate cardiology assistance Appreciate cardiology consult Follow repeat echo  - with preserved EF, normal RVSF Strict I/O, daily weights (~1 L out yesterday, weights downtrending)   Chronic Shortness of Breath Paralyzed R Hemidiaphragm  Respiratory Acidosis  Required bipap overnight due to somnolence with respiratory acidosis - pH notably normal Will continue bipap at night Diuresis as noted above CXR with findings concerning for pneumonia vs atelectasis, afebrile without leukocytosis, low suspicion for pneumonia at this time  AKI (acute kidney injury) Moderate  Right Hydronephrosis Echogenic Lesion in Anterior Bladder Creatinine on admission 1.89, up from 0.92 in mid October.  Patient given 40 mg IV Lasix  in the ED due to signs of volume overload as  outlined.   Creatinine to 1.85 today, diuresis on hold UA without protein or RBC's  Poor UOP yesterday documented - but 600 out via I/O cath, ? Urinary retention, continue bladder scans, if no UOP will check I/O, if significant volume, may need indwelling catheter to be placed. CT renal stone study without hydro Will pursue CT abd/pelvis with delayed bladder imaging at some time, but will trend kidney function first   Cellulitis of right lower extremity Right lower extremity erythema and new skin breakdown in the setting of edema with weeping, consistent with cellulitis.  DVT was ruled out on ultrasound in the ED.  Treated with vancomycin  and cefepime  in the ED --will plan for [name] short course of abx (5 days) --hypothermic now, transient leukocytosis 12/17, now resolved   --Follow blood cultures --Wound care consulted   Elevated CK Etiology unclear, CK elevated at 1802 on admission.  No reported trauma history or prolonged downtime, but patient is quite sedentary at baseline.  He had [name] normal CK level 1 year ago at 182.  Does have baseline inclusion body myositis. Holding statin for now Will continue to trend --mildly elevated AST - trend --Check urine myoglobin (pending)   Bilateral lower extremity edema Chronic venous insufficiency Chronic lower extremity wounds -followed by wound care clinic long-term Now with new wounds/skin breakdown related to his severe edema with weeping. Wound care consulted, appreciate recs   BPH Urinary Retention Flomax  Required I/O cath yesterday x1 with 600 cc out  HTN (hypertension) Holding home lisinopril  with AKI above Continue atenolol     Coronary artery disease Elevated troponin -mild, and trended down, 169>> 15.  Patient has no complaints of chest pain and EKG showed no acute ischemic changes. Atenolol , crestor  on hold Echocardiogram with preserved EF, unable to evaluate regional wall motion   History of DVT (deep vein thrombosis) On Eliquis   2.5 mg twice daily and daughter reports good compliance with this. Right lower extremity venous Doppler ultrasound was negative for DVT in the ED. --Eliquis    Anemia Thrombocytopenia Chronic, trend Hb downtrending, follow  3:48 PM addendum: Hb dropping, unclear cause - smear, retics, trend H/H, haptoglobin, LDH.  Type and screen done.  No evidence bleeding, stable vitals.   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 PT/OT     DVT prophylaxis: eliquis  Code Status: full Family Communication: none  Disposition:   Status is: Observation The patient remains OBS appropriate and will d/c before 2 midnights.   Consultants:  cardiology  Procedures:  none  Antimicrobials:  Anti-infectives (From admission, onward)    Start     Dose/Rate Route Frequency Ordered Stop   07/09/24 1745  ceFAZolin  (ANCEF ) IVPB 2g/100 mL premix        2 g 200 mL/hr over 30 Minutes Intravenous Every 8 hours 07/09/24 1734 07/16/24  1359   07/09/24 1500  vancomycin  (VANCOCIN ) IVPB 1000 mg/200 mL premix       Placed in Followed by Linked Group   1,000 mg 200 mL/hr over 60 Minutes Intravenous  Once 07/09/24 1406 07/09/24 1808   07/09/24 1415  vancomycin  (VANCOCIN ) IVPB 1000 mg/200 mL premix  Status:  Discontinued        1,000 mg 200 mL/hr over 60 Minutes Intravenous  Once 07/09/24 1404 07/09/24 1406   07/09/24 1415  ceFEPIme  (MAXIPIME ) 2 g in sodium chloride  0.9 % 100 mL IVPB        2 g 200 mL/hr over 30  Minutes Intravenous  Once 07/09/24 1404 07/09/24 1525   07/09/24 1415  vancomycin  (VANCOCIN ) IVPB 1000 mg/200 mL premix       Placed in Followed by Linked Group   1,000 mg 200 mL/hr over 60 Minutes Intravenous  Once 07/09/24 1406 07/09/24 1658       Subjective: Daughter [name] at bedside He has no specific complaints - no pain, notes some SOB  Objective: Vitals:   07/11/24 2315 07/11/24 2355 07/12/24 0602 07/12/24 0735  BP:  (!) 103/53  (!) 119/106  Pulse: 84   83  Resp:  18 20   Temp:      TempSrc:   Oral   SpO2: 100%     Weight:   (!) 136.6 kg   Height:        Intake/Output Summary (Last 24 hours) at 07/12/2024 0917 Last data filed at 07/11/2024 2359 Gross per 24 hour  Intake 955.61 ml  Output 650 ml  Net 305.61 ml   Filed Weights   07/10/24 1219 07/11/24 0500 07/12/24 0602  Weight: 136.1 kg 135.6 kg (!) 136.6 kg    Examination:  General: No acute distress. Cardiovascular: RRR Lungs: increased WOB - distant on anterior exam Abdomen: Soft, nontender, nondistended Neurological: Alert and oriented 3 - but more lethargic today, falling asleep [name] few times during my interview. Moves all extremities 4. Cranial nerves II through XII grossly intact.  No asterixis. Skin: Warm and dry. No rashes or lesions. Extremities: erythema and induration to RLE (edema improved after leg in compression with ace), no notable fluctuance or crepitus - no notable TTP.  Bilateral LE edema.    Data Reviewed: I have personally reviewed following labs and imaging studies  CBC: Recent Labs  Lab 07/09/24 1230 07/09/24 1833 07/09/24 1936 07/09/24 2041 07/10/24 0325 07/11/24 0504 07/12/24 0421  WBC 6.3  --   --   --   --  12.1* 8.6  NEUTROABS  --   --   --   --   --  9.7* 6.5  HGB 11.2*   < > 11.6* 11.6* 11.2* 10.9* 9.8*  HCT 34.5*   < > 34.0* 34.0* 33.0* 33.5* 29.9*  MCV 106.5*  --   --   --   --  108.1* 109.1*  PLT 71*  --   --   --   --  88* 74*   < > = values in this  interval not displayed.    Basic Metabolic Panel: Recent Labs  Lab 07/09/24 1230 07/09/24 1833 07/09/24 2041 07/10/24 0325 07/10/24 0514 07/11/24 0504 07/12/24 0421  NA 140   < > 139 139 142 140 137  K 4.8   < > 4.4 4.3 4.5 4.7 4.0  CL 101  --   --   --  101 99 98  CO2 29  --   --   --  31 30 30   GLUCOSE 95  --   --   --  118* 105* 112*  BUN 44*  --   --   --  41* 47* 60*  CREATININE 1.98*  --   --   --  1.48* 1.46* 1.85*  CALCIUM  9.2  --   --   --  9.3 9.2 8.7*  MG  --   --   --   --   --  2.2 2.2  PHOS  --   --   --   --   --  3.3 4.7*   < > = values in this interval not displayed.    GFR: Estimated Creatinine Clearance (by C-G formula based on SCr of 1.85 mg/dL (H)) Male: 60.2 mL/min ([name]) Male: 48 mL/min ([name])  Liver Function Tests: Recent Labs  Lab 07/09/24 1500 07/11/24 0504 07/12/24 0421  AST 104* 115* 101*  ALT 35 21 8  ALKPHOS 77 58 55  BILITOT 0.6 0.6 0.4  PROT 6.6 5.8* 5.0*  ALBUMIN 3.7 3.3* 3.0*    CBG: No results for input(s): GLUCAP in the last 168 hours.   Recent Results (from the past 240 hours)  Culture, blood (routine x 2)     Status: None (Preliminary result)   Collection Time: 07/09/24  2:40 PM   Specimen: BLOOD  Result Value Ref Range Status   Specimen Description   Final    BLOOD LEFT ANTECUBITAL Performed at [facility], [address] [place], [place] [address]    Special Requests   Final    BOTTLES DRAWN AEROBIC AND ANAEROBIC Blood Culture adequate volume Performed at [facility], [address] [place], [place] [address]    Culture   Final    NO GROWTH 3 DAYS Performed at [facility], [address] [place], [place] [address]    Report Status PENDING  Incomplete         Radiology Studies: CT RENAL STONE STUDY Result Date: 07/11/2024 EXAM: CT ABDOMEN AND PELVIS WITHOUT CONTRAST 07/10/2024 02:06:00 PM TECHNIQUE: CT of the abdomen and pelvis was performed without the  administration of intravenous contrast. Multiplanar reformatted images are provided for review. Automated exposure control, iterative reconstruction, and/or weight-based adjustment of the mA/kV was utilized to reduce the radiation dose to as low as reasonably achievable. COMPARISON: None available. CLINICAL HISTORY: hydronephrosis hydronephrosis FINDINGS: LOWER CHEST: Band of atelectasis with associated calcifications in the right lower lobe, consistent with benign postinflammatory changes. LIVER: Small subcapsular fluid attenuation and [name] 1 cm cyst within the liver. GALLBLADDER AND BILE DUCTS: Gallbladder is unremarkable. No biliary ductal dilatation. SPLEEN: No acute abnormality. PANCREAS: No acute abnormality. ADRENAL GLANDS: No acute abnormality. KIDNEYS, URETERS AND BLADDER: Bilateral nonobstructing renal calculi. No renal calculus. No bladder calculi. No hydronephrosis. No perinephric or periureteral stranding. Urinary bladder is unremarkable. GI AND BOWEL: Stomach demonstrates no acute abnormality. Normal appendix. There is no bowel obstruction. PERITONEUM AND RETROPERITONEUM: No ascites. No free air. VASCULATURE: Atherosclerotic calcification of the abdominal aorta. LYMPH NODES: No lymphadenopathy. REPRODUCTIVE ORGANS: No acute abnormality. BONES AND SOFT TISSUES: Right hip prosthetic. No acute osseous abnormality. No focal soft tissue abnormality. IMPRESSION: 1. No hydronephrosis or obstructing urinary tract calculus. 2. Bilateral nonobstructing renal calculi. 3. No acute findings in the abdomen or pelvis. Electronically signed by: Norleen Boxer MD 07/11/2024 10:05 AM EST RP Workstation: [id number]   ECHOCARDIOGRAM COMPLETE Result Date: 07/10/2024    ECHOCARDIOGRAM REPORT   Patient Name:   [name]  [name] Date of Exam: 07/10/2024 Medical Rec #:  [id number]   Height:       70.0 in Accession #:    [id number]  Weight:       300.0 lb Date of Birth:  Aug 18, 1950   BSA:          2.480 m Patient Age:    73 years     BP:           77/39 mmHg Patient Gender: M           HR:           100 bpm. Exam Location:  Inpatient Procedure: 2D Echo, Cardiac Doppler and Color Doppler (Both Spectral and Color            Flow Doppler were utilized during procedure). Indications:    CHF - Acute Diastolic  History:        Patient has prior history of Echocardiogram examinations, most                 recent 05/30/2023. CHF, CAD, Signs/Symptoms:Edema; Risk                 Factors:Dyslipidemia, Hypertension and Former Smoker.  Sonographer:    Juliene Rucks Referring Phys: [id number] KELLY Jasleen Riepe GRIFFITH  Sonographer Comments: Technically difficult study due to poor echo windows, no subcostal window and patient is obese. Image acquisition challenging due to patient body habitus. IMPRESSIONS  1. Left ventricular ejection fraction, by estimation, is 60 to 65%. The left ventricle has normal function. Left ventricular endocardial border not optimally defined to evaluate regional wall motion. There is mild concentric left ventricular hypertrophy. Indeterminate diastolic filling due to E-Shell Yandow fusion.  2. Right ventricular systolic function is normal. The right ventricular size is not well visualized. Tricuspid regurgitation signal is inadequate for assessing PA pressure.  3. Left atrial size was mildly dilated.  4. Right atrial size was mildly dilated.  5. The mitral valve is normal in structure. No evidence of mitral valve regurgitation. No evidence of mitral stenosis. Moderate mitral annular calcification.  6. The aortic valve was not well visualized. Aortic valve regurgitation is not visualized. No aortic stenosis is present.  7. Aortic dilatation noted. There is borderline dilatation of the aortic root, measuring 39 mm. FINDINGS  Left Ventricle: Left ventricular ejection fraction, by estimation, is 60 to 65%. The left ventricle has normal function. Left ventricular endocardial border not optimally defined to evaluate regional wall motion. The left ventricular  internal cavity size was normal in size. There is mild concentric left ventricular hypertrophy. Indeterminate diastolic filling due to E-Tekeyah Santiago fusion. Right Ventricle: The right ventricular size is not well visualized. Right vetricular wall thickness was not well visualized. Right ventricular systolic function is normal. Tricuspid regurgitation signal is inadequate for assessing PA pressure. Left Atrium: Left atrial size was mildly dilated. Right Atrium: Right atrial size was mildly dilated. Pericardium: There is no evidence of pericardial effusion. Mitral Valve: The mitral valve is normal in structure. Moderate mitral annular calcification. No evidence of mitral valve regurgitation. No evidence of mitral valve stenosis. Tricuspid Valve: The tricuspid valve is not well visualized. Tricuspid valve regurgitation is not demonstrated. Aortic Valve: The aortic valve was not well visualized. Aortic valve regurgitation is not visualized. No aortic stenosis is present. Pulmonic Valve: The pulmonic valve was not well visualized. Pulmonic valve regurgitation is not visualized. No evidence of pulmonic stenosis. Aorta: Aortic dilatation noted. There is borderline dilatation of the aortic root, measuring 39  mm. IAS/Shunts: The interatrial septum was not well visualized.  LEFT VENTRICLE PLAX 2D LVIDd:         4.90 cm LVIDs:         3.30 cm LV PW:         1.20 cm LV IVS:        1.30 cm LVOT diam:     2.30 cm LV SV:         70 LV SV Index:   28 LVOT Area:     4.15 cm  RIGHT VENTRICLE RV S prime:     14.50 cm/s TAPSE (M-mode): 1.8 cm LEFT ATRIUM             Index        RIGHT ATRIUM           Index LA diam:        3.50 cm 1.41 cm/m   RA Area:     23.38 cm 9.43 cm/m LA Vol (A2C):   70.5 ml 28.43 ml/m  RA Volume:   59.12 ml  23.84 ml/m LA Vol (A4C):   62.6 ml 25.24 ml/m LA Biplane Vol: 67.5 ml 27.22 ml/m  AORTIC VALVE LVOT Vmax:   100.00 cm/s LVOT Vmean:  66.200 cm/s LVOT VTI:    0.169 m  AORTA Ao Root diam: 3.90 cm  SHUNTS  Systemic VTI:  0.17 m Systemic Diam: 2.30 cm Mihai Croitoru MD Electronically signed by Jerel Balding MD Signature Date/Time: 07/10/2024/5:24:30 PM    Final         Scheduled Meds:  apixaban   2.5 mg Oral BID   folic acid   1 mg Oral Daily   Gerhardt's butt cream   Topical Daily   lidocaine   1 patch Transdermal Q24H   melatonin  10 mg Oral QHS   multivitamin with minerals  1 tablet Oral Daily   sodium chloride  flush  10-40 mL Intracatheter Q12H   tamsulosin   0.4 mg Oral Daily   thiamine   100 mg Oral Daily   Or   thiamine   100 mg Intravenous Daily   Continuous Infusions:   ceFAZolin  (ANCEF ) IV 2 g (07/12/24 0611)     LOS: 2 days    Time spent: 50 min critical care time with hypotension, hypothermia.    Meliton Monte, MD [facility]   To contact the attending provider between 7A-7P or the covering provider during after hours 7P-7A, please log into the web site [email or web address] and access using universal [place] password for that web site. If you do not have the password, please call the hospital operator.  07/12/2024, 9:17 AM

## 2024-07-12 NOTE — Progress Notes (Addendum)
 This AM pt with 95.0 rectal temperature, unable to receive accurate oral temperatures after multiple attempts.  BP 119/106 HR 73 at this time. Pt alert and oriented. Notified Perri Mau, MD via page of rectal temperature. Orders to place patient on bair hugger ordered at [phone]. Pt placed on Bair hugger. Pt placed on Bipap by respiratory team. Pt agreeable, SATs 99% Bladder scanned pt, at 1050 pt had In and out cath per MD order. output. MD ordered to place foley cath due to urinary retention at 1059. Foley placed by Hargis Donald, RN and Harlene Kraft, RN. Temperatures checked on patient Q2. 1250 pt Temp 95.7 BP 110/52 HR 83. Pt temperature came up to 95.9 by 1440. At 1549 BP was 67/41. MD Mau paged and came to bedside. Rapid response nurse Joyceann Faith notified and arrived to bedside. Lactated Ringers  Bolus orders placed. Pt BP continuously low, checked Q2 minutes. Patient still oriented, more lethargic. ICU MD [facility] at bedside, placed orders for Levophed  to be hung by Rapid response nurse. Orders to transfer placed at 1632. Pt transferred with belongings: dentures, computer, charger, cellphone, stand, clothing, and personal care items. Bedside report was given to Jose Montejano on 57M.

## 2024-07-13 ENCOUNTER — Encounter (HOSPITAL_BASED_OUTPATIENT_CLINIC_OR_DEPARTMENT_OTHER): Admitting: General Surgery

## 2024-07-13 DIAGNOSIS — I5033 Acute on chronic diastolic (congestive) heart failure: Secondary | ICD-10-CM | POA: Diagnosis not present

## 2024-07-13 DIAGNOSIS — J9612 Chronic respiratory failure with hypercapnia: Secondary | ICD-10-CM

## 2024-07-13 LAB — CBC WITH DIFFERENTIAL/PLATELET
Abs Immature Granulocytes: 0.11 K/uL — ABNORMAL HIGH (ref 0.00–0.07)
Basophils Absolute: 0 K/uL (ref 0.0–0.1)
Basophils Relative: 0 %
Eosinophils Absolute: 0 K/uL (ref 0.0–0.5)
Eosinophils Relative: 0 %
HCT: 30.6 % — ABNORMAL LOW (ref 39.0–52.0)
Hemoglobin: 9.9 g/dL — ABNORMAL LOW (ref 13.0–17.0)
Immature Granulocytes: 1 %
Lymphocytes Relative: 5 %
Lymphs Abs: 0.4 K/uL — ABNORMAL LOW (ref 0.7–4.0)
MCH: 35.4 pg — ABNORMAL HIGH (ref 26.0–34.0)
MCHC: 32.4 g/dL (ref 30.0–36.0)
MCV: 109.3 fL — ABNORMAL HIGH (ref 80.0–100.0)
Monocytes Absolute: 0.5 K/uL (ref 0.1–1.0)
Monocytes Relative: 5 %
Neutro Abs: 7.5 K/uL (ref 1.7–7.7)
Neutrophils Relative %: 89 %
Platelets: 67 K/uL — ABNORMAL LOW (ref 150–400)
RBC: 2.8 MIL/uL — ABNORMAL LOW (ref 4.22–5.81)
RDW: 14 % (ref 11.5–15.5)
WBC: 8.4 K/uL (ref 4.0–10.5)
nRBC: 0 % (ref 0.0–0.2)

## 2024-07-13 LAB — COMPREHENSIVE METABOLIC PANEL WITH GFR
ALT: <5 U/L (ref 0–44)
AST: 67 U/L — ABNORMAL HIGH (ref 15–41)
Albumin: 3.2 g/dL — ABNORMAL LOW (ref 3.5–5.0)
Alkaline Phosphatase: 55 U/L (ref 38–126)
Anion gap: 9 (ref 5–15)
BUN: 53 mg/dL — ABNORMAL HIGH (ref 8–23)
CO2: 29 mmol/L (ref 22–32)
Calcium: 9.1 mg/dL (ref 8.9–10.3)
Chloride: 101 mmol/L (ref 98–111)
Creatinine, Ser: 1.21 mg/dL (ref 0.61–1.24)
GFR, Estimated: >60 mL/min
Glucose, Bld: 119 mg/dL — ABNORMAL HIGH (ref 70–99)
Potassium: 4.5 mmol/L (ref 3.5–5.1)
Sodium: 139 mmol/L (ref 135–145)
Total Bilirubin: 0.6 mg/dL (ref 0.0–1.2)
Total Protein: 5.3 g/dL — ABNORMAL LOW (ref 6.5–8.1)

## 2024-07-13 LAB — IRON AND TIBC
Iron: 58 ug/dL (ref 45–182)
Saturation Ratios: 23 % (ref 17.9–39.5)
TIBC: 252 ug/dL (ref 250–450)
UIBC: 194 ug/dL

## 2024-07-13 LAB — MAGNESIUM: Magnesium: 2.3 mg/dL (ref 1.7–2.4)

## 2024-07-13 LAB — HEMOGLOBIN AND HEMATOCRIT, BLOOD
HCT: 25.9 % — ABNORMAL LOW (ref 39.0–52.0)
HCT: 29.7 % — ABNORMAL LOW (ref 39.0–52.0)
Hemoglobin: 8.2 g/dL — ABNORMAL LOW (ref 13.0–17.0)
Hemoglobin: 9.6 g/dL — ABNORMAL LOW (ref 13.0–17.0)

## 2024-07-13 LAB — FERRITIN: Ferritin: 213 ng/mL (ref 24–336)

## 2024-07-13 LAB — GLUCOSE, CAPILLARY
Glucose-Capillary: 136 mg/dL — ABNORMAL HIGH (ref 70–99)
Glucose-Capillary: 123 mg/dL — ABNORMAL HIGH (ref 70–99)
Glucose-Capillary: 139 mg/dL — ABNORMAL HIGH (ref 70–99)
Glucose-Capillary: 102 mg/dL — ABNORMAL HIGH (ref 70–99)

## 2024-07-13 LAB — PHOSPHORUS: Phosphorus: 3.4 mg/dL (ref 2.5–4.6)

## 2024-07-13 MED ORDER — CHLORHEXIDINE GLUCONATE CLOTH 2 % EX PADS
6.0000 | MEDICATED_PAD | Freq: Every day | CUTANEOUS | Status: AC
  Administered 2024-07-13 – 2024-07-17 (×5): 6 via TOPICAL

## 2024-07-13 MED ORDER — VANCOMYCIN HCL 1750 MG/350ML IV SOLN
1750.0000 mg | INTRAVENOUS | Status: DC
  Administered 2024-07-13: 1750 mg via INTRAVENOUS
  Filled 2024-07-13 (×2): qty 350

## 2024-07-13 MED ORDER — MUPIROCIN 2 % EX OINT
1.0000 | TOPICAL_OINTMENT | Freq: Two times a day (BID) | CUTANEOUS | Status: AC
  Administered 2024-07-13 – 2024-07-17 (×10): 1 via NASAL
  Filled 2024-07-13 (×3): qty 22

## 2024-07-13 MED ORDER — POLYETHYLENE GLYCOL 3350 17 G PO PACK
17.0000 g | PACK | Freq: Every day | ORAL | Status: DC
  Administered 2024-07-15 – 2024-07-19 (×5): 17 g via ORAL
  Filled 2024-07-13 (×7): qty 1

## 2024-07-13 MED ORDER — BISACODYL 5 MG PO TBEC
5.0000 mg | DELAYED_RELEASE_TABLET | Freq: Every day | ORAL | Status: DC
  Administered 2024-07-14 – 2024-07-20 (×7): 5 mg via ORAL
  Filled 2024-07-13 (×8): qty 1

## 2024-07-13 NOTE — Progress Notes (Signed)
 "  Rounding Note   Patient Name: [name] Date of Encounter: 07/13/2024  [facility] Cardiologist: Vina Gull, MD   Subjective  He is feeling better. No chest pain or dyspnea  Scheduled Meds:  sodium chloride    Intravenous Once   apixaban   2.5 mg Oral BID   Chlorhexidine  Gluconate Cloth  6 each Topical Daily   cosyntropin   0.25 mg Intravenous Once   folic acid   1 mg Oral Daily   Gerhardt's butt cream   Topical Daily   hydrocortisone  sod succinate (SOLU-CORTEF ) inj  100 mg Intravenous Q12H   lidocaine   1 patch Transdermal Q24H   melatonin  10 mg Oral QHS   multivitamin with minerals  1 tablet Oral Daily   mupirocin ointment  1 Application Nasal BID   sodium chloride  flush  10-40 mL Intracatheter Q12H   tamsulosin   0.4 mg Oral Daily   thiamine   100 mg Oral Daily   Or   thiamine   100 mg Intravenous Daily   Continuous Infusions:  ceFEPime  (MAXIPIME ) IV Stopped (07/12/24 1746)   norepinephrine  (LEVOPHED ) Adult infusion 5 mcg/min (07/13/24 0600)   vancomycin      vasopressin  Stopped (07/13/24 0033)   PRN Meds: acetaminophen  **OR** acetaminophen , bisacodyl , HYDROmorphone  (DILAUDID ) injection, LORazepam  **OR** LORazepam , ondansetron  **OR** ondansetron  (ZOFRAN ) IV, oxyCODONE , senna-docusate, sodium chloride  flush   Vital Signs  Vitals:   07/13/24 0600 07/13/24 0700 07/13/24 0726 07/13/24 0814  BP: (!) 128/52 (!) 117/56    Pulse: 81 79  72  Resp: 18 17  12   Temp:   97.6 F (36.4 C)   TempSrc:   Axillary   SpO2: 100% 100%  100%  Weight:      Height:        Intake/Output Summary (Last 24 hours) at 07/13/2024 0956 Last data filed at 07/13/2024 0600 Gross per 24 hour  Intake 1748.89 ml  Output 1775 ml  Net -26.11 ml      07/13/2024    5:00 AM 07/12/2024    5:00 PM 07/12/2024    6:02 AM  Last 3 Weights  Weight (lbs) 305 lb 8.9 oz 292 lb 12.3 oz 301 lb 2.4 oz  Weight (kg) 138.6 kg 132.8 kg 136.6 kg      Telemetry sinus - Personally Reviewed  ECG   No am tracing   Physical Exam  GEN: No acute distress.   Neck: No JVD Cardiac: RRR, no murmurs, rubs, or gallops.  Respiratory: Clear to auscultation bilaterally. GI: Soft, nontender, non-distended  MS: right leg is wrapped with chronic edema. Minimal left LE edema. Chronic venous stasis changes noted  Neuro:  Nonfocal  Psych: Normal affect   Labs High Sensitivity Troponin:  No results for input(s): TROPONINIHS in the last 720 hours.  Recent Labs  Lab 07/09/24 1230 07/09/24 1500  TRNPT 169* 154*       Chemistry Recent Labs  Lab 07/09/24 1500 07/09/24 1833 07/10/24 0514 07/11/24 0504 07/12/24 0421 07/12/24 1733  NA  --    < > 142 140 137 135  K  --    < > 4.5 4.7 4.0 4.2  CL  --   --  101 99 98  --   CO2  --   --  31 30 30   --   GLUCOSE  --   --  118* 105* 112*  --   BUN  --   --  41* 47* 60*  --   CREATININE  --   --  1.48*  1.46* 1.85*  --   CALCIUM   --   --  9.3 9.2 8.7*  --   MG  --   --   --  2.2 2.2  --   PROT 6.6  --   --  5.8* 5.0*  --   ALBUMIN 3.7  --   --  3.3* 3.0*  --   AST 104*  --   --  115* 101*  --   ALT 35  --   --  21 8  --   ALKPHOS 77  --   --  58 55  --   BILITOT 0.6  --   --  0.6 0.4  --   GFRNONAA  --   --  49* 50* 38*  --   ANIONGAP  --   --  9 11 9   --    < > = values in this interval not displayed.    Lipids No results for input(s): CHOL, TRIG, HDL, LABVLDL, LDLCALC, CHOLHDL in the last 168 hours.  Hematology Recent Labs  Lab 07/11/24 0504 07/12/24 0421 07/12/24 1332 07/12/24 1554 07/12/24 1733 07/12/24 1807 07/13/24 0006 07/13/24 0906  WBC 12.1* 8.6  --   --   --   --   --  8.4  RBC 3.10* 2.74*  --  2.45*  --   --   --  2.80*  HGB 10.9* 9.8*   < >  --    < > 9.8* 8.2* 9.9*  HCT 33.5* 29.9*   < >  --    < > 30.3* 25.9* 30.6*  MCV 108.1* 109.1*  --   --   --   --   --  109.3*  MCH 35.2* 35.8*  --   --   --   --   --  35.4*  MCHC 32.5 32.8  --   --   --   --   --  32.4  RDW 14.2 14.4  --   --   --   --   --   14.0  PLT 88* 74*  --   --   --   --   --  67*   < > = values in this interval not displayed.   Thyroid   Recent Labs  Lab 07/12/24 0421 07/12/24 0932  TSH 3.190  --   FREET4  --  1.20    BNP Recent Labs  Lab 07/09/24 1230  PROBNP 909.0*    DDimer No results for input(s): DDIMER in the last 168 hours.   Radiology  CT CHEST WO CONTRAST Result Date: 07/13/2024 EXAM: CHEST CT WITHOUT CONTRAST 07/12/2024 10:18:45 PM TECHNIQUE: CT of the chest was performed without the administration of intravenous contrast. Multiplanar reformatted images are provided for review. Automated exposure control, iterative reconstruction, and/or weight based adjustment of the mA/kV was utilized to reduce the radiation dose to as low as reasonably achievable. COMPARISON: 06/29/23 high resolution chest CT. Chest radiograph from earlier today. CLINICAL HISTORY: Respiratory illness, nondiagnostic xray. FINDINGS: MEDIASTINUM: Three vessel coronary atherosclerosis. Atherosclerotic thoracic aorta with dilated 4.1 cm ascending thoracic aorta. Dilated main pulmonary artery (4.2 cm diameter). The central airways are clear. LYMPH NODES: No mediastinal, hilar or axillary lymphadenopathy. LUNGS AND PLEURA: No pleural effusion or pneumothorax. Stable subcentimeter calcified left lower lobe granulomas. No lung masses or significant pulmonary nodules. Chronic prominent elevation of the right hemidiaphragm. Complete right middle lobe and near complete right lower lobe atelectasis. Subsegmental left lung base atelectasis. Chronic mild  patchy peripheral reticulation in the right greater than left lungs is unchanged. SOFT TISSUES AND BONES: Marked thoracic spondylosis. No acute abnormality of the soft tissues. UPPER ABDOMEN: Limited images of the upper abdomen demonstrate a nonobstructing 9 mm upper right renal stone. IMPRESSION: 1. Chronic prominent elevation of the right hemidiaphragm. Complete right middle lobe and near complete right  lower lobe atelectasis. 2. Subsegmental left lung base atelectasis. 3. Chronic mild patchy peripheral reticulation in the right greater than left lungs, unchanged. 4. Dilated main pulmonary artery, suggesting pulmonary arterial hypertension. 5. Dilated 4.1 cm ascending thoracic aorta. Follow-up chest CT angiogram advised in 12 months. 6. Three vessel coronary atherosclerosis. 7. Nonobstructing 9 mm upper right renal stone. 8. Aortic Atherosclerosis (ICD10-I70.0). 9. Aortic aneurysm NOS (ICD10-I71.9). Electronically signed by: Selinda Blue MD 07/13/2024 07:20 AM EST RP Workstation: [id number]   DG CHEST PORT 1 VIEW Result Date: 07/12/2024 EXAM: 1 VIEW(S) XRAY OF THE CHEST 07/12/2024 09:50:00 AM COMPARISON: 07/09/2024 CLINICAL HISTORY: Shortness of breath FINDINGS: LUNGS AND PLEURA: Low lung volumes. Elevation of the right hemidiaphragm is noted. Increasing right basilar atelectasis is noted. No pleural effusion. No pneumothorax. HEART AND MEDIASTINUM: Aortic atherosclerosis. No acute abnormality of the cardiac and mediastinal silhouettes. BONES AND SOFT TISSUES: Cervical fusion hardware, partially imaged. No acute osseous abnormality. IMPRESSION: 1. Increasing right basilar atelectasis. Electronically signed by: Oneil Devonshire MD 07/12/2024 07:49 PM EST RP Workstation: [id number]    Patient Profile    73 y.o. adult with history of CAD, HTN, DVT, chronic lymphedema admitted with volume overload. He was diuresed but on 07/12/24 became hypotensive and lethargic with poor urine output and was transferred to the ICU with possible sepsis. Echo 07/10/24 with LVEF=60-65%.   Assessment & Plan   Acute on chronic diastolic CHF: Diuretics on hold with current treatment for sepsis.  - Continue to hold diuretics today  CAD without angina: He is not on ASA (Eliquis  at home). Crestor  and Zetia  on hold. No chest pain.   DVT: He is on Eliquis   No new recs today.   For questions or updates, please contact [facility] Please consult [email or web address] for contact info under     Signed, Lonni Cash, MD  07/13/2024, 9:56 AM    "

## 2024-07-13 NOTE — Progress Notes (Signed)
 Occupational Therapy Treatment Patient Details Name: [name] MRN: [id number] DOB: 02-Dec-1950 Today's Date: 07/13/2024   History of present illness 73 yo M adm 07/09/24 with LB edema, SOB, orthopnea, CHF, RLE cellulitis, Rt hydronephrosis. Pt with rapid response called on 12/18 transferred to ICU Due to labored breathing. PMHx: CAD, HTN, HLD, inclusion body myositis, DVT, chonic LB edema, paralyzed Rt hemidiaphragm   OT comments  Pt seen for OT treatment this PM, reluctantly agreeable to participate due to feeling fatigued from prior PT visit earlier today. Supportive wife present at bedside. Pt declined EOB/OOB mobility today, but participatory for bed level ADLs. Completed simple grooming with min A due to chronic muscle weakness and nerve damage per pt. Wife reports he uses adaptive utensils at home. Briefly educated on UE HEP; VSS throughout. Current recommendation for high-intensity post-acute rehab >3 hrs/day remains appropriate, OT to continue to follow.      If plan is discharge home, recommend the following:  Two people to help with walking and/or transfers;Two people to help with bathing/dressing/bathroom   Equipment Recommendations  None recommended by OT    Recommendations for Other Services      Precautions / Restrictions Precautions Precautions: Fall Recall of Precautions/Restrictions: Intact Restrictions Weight Bearing Restrictions Per Provider Order: No       Mobility Bed Mobility               General bed mobility comments: pt declined EOB 2/2 fatigue from working with PT earlier today despite much encouragement    Transfers                         Balance               Standing balance comment: NT                           ADL either performed or assessed with clinical judgement   ADL Overall ADL's : Needs assistance/impaired     Grooming: Minimal assistance;Bed level;Wash/dry face;Oral care Grooming Details  (indicate cue type and reason): completed oral care with assist to manipulate toothpaste bottle and spit basin, wife reporting pt has home modifications given chronic weakness and nerve damage in BUE             Lower Body Dressing: Total assistance;Bed level Lower Body Dressing Details (indicate cue type and reason): adjusting B socks                    Extremity/Trunk Assessment              Vision       Perception     Praxis     Communication Communication Communication: Impaired Factors Affecting Communication: Hearing impaired   Cognition Arousal: Alert Behavior During Therapy: WFL for tasks assessed/performed Cognition: No apparent impairments             OT - Cognition Comments: appeared anxious, benefits from encouragement and reassurance                 Following commands: Intact        Cueing   Cueing Techniques: Verbal cues  Exercises      Shoulder Instructions       General Comments SpO2 maintained > 92% on 2L [place], supportive wife present during session    Pertinent Vitals/ Pain       Pain Assessment Pain Assessment: Faces  Faces Pain Scale: No hurt  Home Living                                          Prior Functioning/Environment              Frequency  Min 2X/week        Progress Toward Goals  OT Goals(current goals can now be found in the care plan section)  Progress towards OT goals: Progressing toward goals     Plan      Co-evaluation                 AM-PAC OT 6 Clicks Daily Activity     Outcome Measure   Help from another person eating meals?: None Help from another person taking care of personal grooming?: A Little Help from another person toileting, which includes using toliet, bedpan, or urinal?: A Lot Help from another person bathing (including washing, rinsing, drying)?: A Lot Help from another person to put on and taking off regular upper body clothing?: A  Little Help from another person to put on and taking off regular lower body clothing?: Total 6 Click Score: 15    End of Session    OT Visit Diagnosis: Unsteadiness on feet (R26.81);Muscle weakness (generalized) (M62.81)   Activity Tolerance Patient tolerated treatment well   Patient Left in bed;with call bell/phone within reach;with bed alarm set;with family/visitor present   Nurse Communication Mobility status        Time: 1431-1458 OT Time Calculation (min): 27 min  Charges: OT General Charges $OT Visit: 1 Visit OT Treatments $Self Care/Home Management : 23-37 mins  Rafiq Bucklin M. [place], OTR/L [facility] [phone] Secure Chat Preferred  Yomayra Tate 07/13/2024, 4:45 PM

## 2024-07-13 NOTE — Progress Notes (Signed)
 "  NAME:  [name], MRN:  [id number], DOB:  04-Sep-1950, LOS: 3 ADMISSION DATE:  07/09/2024, CONSULTATION DATE:  07/12/2024 REFERRING MD:  Dr.Powell Meliton Raddle., CHIEF COMPLAINT:  Septic Shock   History of Present Illness:  This 73 year old gentleman with a background history of coronary artery disease, heart failure with preserved ejection fraction, hypertension, inclusion body myositis, DVT chronic bilateral venous eczema presents to us  with worsening of lower extremity swelling and orthopnea.  Patient was very drowsy initially. He was being diuresed outpatient by cardiology for his heart failure and edema.  Patient has a persistent chronic orthopnea and sits up and sleeps.  Patient presents with lethargy and found to have cellulitis of his right lower extremity.  He was being given IV antibiotics however he is hypotensive and hypothermic.  ICU was called to see this patient to evaluate for the need of vasopressor therapy.  Patient when I went to see him was very lethargic MAP was 45.  The first liter of IV bolus was going on.  He denies any chest pain shortness of breath nausea vomiting.  He is able to maintain his airway.  He was hypothermic and is being rewarmed.  As soon as the patient came to ICU first liter of IV fluid is in patient is being rewarmed and Levophed  is started at 6 mcg/min.  His maps up to 94.  Will admit the patient to ICU for septic shock due to cellulitis.  Pertinent  Medical History   Past Medical History:  Diagnosis Date   Adenomatous colon polyp - diminutive 06/2019   no recall needed   Allergy sinus   hay fever   Bilateral lower extremity edema    BPH with lower urinary tract symptoms without urinary obstruction    Cataract    Coronary artery disease    a. s/p BMS to LAD (1998) b. s/p DES to RCA (06/24/14)   DVT (deep venous thrombosis) (HCC)    12/2020 L femoral and poplitea.  Unprovoked.  hypercoag w/u NEG.   Eliquis .   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 ?inclusion body myositis? Power WC as of 2022   Macrocytic anemia    Unknown etiology (bone marrow biopsy benign 2025)   Osteoarthritis, multiple sites    Palatal fistula    Palpable abd. aorta    Aortic u/s NO ANEURISM 2020   RBBB    Spinal stenosis    Dr Royal Bury  surgery   Thrombocytopenia    Uknown etiology (Bone marrow biopsy benign 2025)   Significant Hospital Events: Including procedures, antibiotic start and stop dates in addition to other pertinent events   07/12/2024 - Moved to ICU for septic shock needing levophed . 12/19 shock state drastically improved, pressors weaned off this a.m.  Interim History / Subjective:  Seen sitting up in bed with no acute complaints, daughter at bedside  Objective    Blood pressure (!) 117/56, pulse 72, temperature 97.6 F (36.4 C), temperature source Axillary, resp. rate 12, height 5' 10 (1.778 m), weight (!) 138.6 kg, SpO2 100%.    FiO2 (%):  [30 %] 30 %  PEEP:  [5 cmH20] 5 cmH20 Pressure Support:  [10 cmH20] 10 cmH20   Intake/Output Summary (Last 24 hours) at 07/13/2024 0855 Last data filed at 07/13/2024 0600 Gross per 24 hour  Intake 1748.89 ml  Output 1775 ml  Net -26.11 ml   Filed Weights   07/12/24 0602 07/12/24 1700 07/13/24 0500  Weight: (!) 136.6 kg 132.8 kg (!) 138.6 kg    Examination: General: Acute on chronic ill-appearing obese elderly male sitting up in bed in no acute distress HEENT: [place]/AT, MM pink/moist, PERRL,  Neuro: Alert and oriented x 3, nonfocal CV: s1s2 regular rate and rhythm, no murmur, rubs, or gallops,  PULM: Clear to auscultation bilaterally, no increased work of breathing, no audible send GI: soft, bowel sounds  active in all 4 quadrants, non-tender, non-distended, tolerating oral diet Extremities: warm/dry, 2-3+ pitting lower extremity edema  Skin: Right lower extremity wrapped in Ace bandage  Resolved problem list  Hypothermia Acute metabolic encephalopathy  Assessment and Plan   Septic Shock secondary to cellulitis of right lower extremity -Possible component of UTI as well, UA with no bacteria but positive leukocytes History of chronic Venous Eczema P: Continue pressors for MAP goal greater than 65, wean as able Stress dose steroids, wean once off pressors Trend lactic acid Follow cultures Continue cefepime  and vancomycin  will discuss with pharmacy/attending de-escalation Hold statins test to evaluate for adrenal insufficiency given stress dose steroids  H/O Chronic Paralysed R Hemidiapghragm Chronic Orthopnea/Hypercapnia  -Likely osa, improved with bpap Acute on Chronic Respiratory Acidosis  -Improved with NIV P: Continue BiPAP as needed and at at bedtime Supplemental oxygen as needed for sat goal greater than 92 Aspiration precautions Encourage activity and frequent pulmonary hygiene Diurese as below  HFpEF -Echocardiogram 12/16: EF 60 to 65%, mild left ventricular hypertrophy and indeterminate diastolic filling pressures, normal RV function H/o Hypertension  History of CAD P: Cardiology following, appreciate assistance Management for sepsis as above Hold diuretics Optimize electrolytes Continuous telemetry Hold home antihypertensives  AKI Moderate right Hydronephrosis History of BPH P: A.m. chemistry pending, follow Follow renal function  Monitor urine output Trend Bmet Avoid nephrotoxins Ensure adequate renal perfusion  Continue home Flomax   H/o DVT  P: Continue home Eliquis   H/o Inclusion body Myositis H/o Alcohol use disorder P: CIWA protocol Continue as needed Ativan  Seizure precautions Supplement thiamine , folate, multivitamin  If patient  remains stable off vasopressor support can likely transfer out of ICU and back to [facility]  Critical care time:  CRITICAL CARE Performed by: Dontarious Schaum D. Harris   Total critical care time: 38 minutes  Critical care time was exclusive of separately billable procedures and treating other patients.  Critical care was necessary to treat or prevent imminent or life-threatening deterioration.  Critical care was time spent personally by me on the following activities: development of treatment plan with patient and/or surrogate as well as nursing, discussions with consultants, evaluation of patient's response to treatment, examination of patient, obtaining history from patient or surrogate, ordering and performing treatments and interventions, ordering and review of laboratory studies, ordering and review of radiographic studies, pulse oximetry and re-evaluation of patient's condition.  Brandice Busser D. Harris, NP-C [facility] Personal contact information can be found on Amion  If no contact or response made please call [phone] 07/13/2024, 9:11 AM         "

## 2024-07-13 NOTE — Progress Notes (Signed)
 Spoke to lab about AM blood draws in coordination with Cosyntropin  administration.  I was informed that day team will start the process.  Med on hold until first lab draw.

## 2024-07-13 NOTE — Progress Notes (Signed)
 Pharmacy Antibiotic Note  [name] is a 73 y.o. adult admitted on 07/09/2024 with cellulitis.  Patient transferred to the ICU for septic shock that rapidly improved.  Pharmacy has been consulted for vancomycin  and cefepime  dosing.  Renal function improving.  Plan: Increase vanc to 1750mg  IV Q24H for AUC 511 using SCr 1.21 Cefepime  2gm IV Q8H Monitor renal fxn, clinical progress, de-escalation   Height: 5' 10 (177.8 cm) Weight: (!) 138.6 kg (305 lb 8.9 oz) IBW/kg (Calculated) : 73  Temp (24hrs), Avg:96.7 F (35.9 C), Min:95.7 F (35.4 C), Max:98 F (36.7 C)  Recent Labs  Lab 07/09/24 1230 07/10/24 0514 07/11/24 0504 07/12/24 0421 07/12/24 1202 07/12/24 1552 07/12/24 2014 07/13/24 0906  WBC 6.3  --  12.1* 8.6  --   --   --  8.4  CREATININE 1.98* 1.48* 1.46* 1.85*  --   --   --  1.21  LATICACIDVEN  --   --   --   --  1.0 1.1 1.7  --     Estimated Creatinine Clearance (by C-G formula based on SCr of 1.21 mg/dL) Male: 38.7 mL/min Male: 74 mL/min    Allergies[1]  Ancef  12/15 >> 12/18 Vanc 12/15 x1, 12/18 >> Cefepime  x1 12/15, 12/18 >>    12/15 BCx -  12/18 MRSA PCR - positive  [name] D. [place], PharmD, BCPS, BCCCP 07/13/2024, 10:18 AM     [1] No Known Allergies

## 2024-07-13 NOTE — Progress Notes (Signed)
 Physical Therapy Treatment Patient Details Name: [name] MRN: [id number] DOB: 29-Jul-1950 Today's Date: 07/13/2024   History of Present Illness 73 yo M adm 07/09/24 with LB edema, SOB, orthopnea, CHF, RLE cellulitis, Rt hydronephrosis. Pt with rapid response called on 12/18 transferred to ICU Due to labored breathing. PMHx: CAD, HTN, HLD, inclusion body myositis, DVT, chonic LB edema, paralyzed Rt hemidiaphragm    PT Comments  Pt is progressing well towards goals. Currently pt is Min to Mod A for bed mobility, CGA to Min A for sit to stand and was able to take steps at EOB with Min A and heavy UE support on RW. Pt is very motivated and has assist at home. Due to pt current functional status, home set up and available assistance at home recommending skilled physical therapy services > 3 hours/day in order to address strength, balance and functional mobility to decrease risk for falls, injury, immobility, skin break down and re-hospitalization.      If plan is discharge home, recommend the following: A lot of help with walking and/or transfers;A lot of help with bathing/dressing/bathroom;Assist for transportation;Help with stairs or ramp for entrance;Assistance with cooking/housework     Equipment Recommendations  None recommended by PT       Precautions / Restrictions Precautions Precautions: Fall Recall of Precautions/Restrictions: Intact Restrictions Weight Bearing Restrictions Per Provider Order: No     Mobility  Bed Mobility Overal bed mobility: Needs Assistance Bed Mobility: Supine to Sit, Sit to Supine     Supine to sit: Used rails, HOB elevated, Mod assist Sit to supine: Min assist   General bed mobility comments: Mod A for supine to sitting with assist at LE and trunk with verbal cues for sequencing and Min A for LE to EOB for supine    Transfers Overall transfer level: Needs assistance Equipment used: Rolling walker (2 wheels) Transfers: Sit to/from Stand Sit to  Stand: Contact guard assist, Min assist, From elevated surface           General transfer comment: CGA from elevated EOB, Min A from lowest position of bed. Pt performed 3x during session with good hand placement. [place] in legs 2x when pt gets nervous about mobility.    Ambulation/Gait     Pre-gait activities: Pt worked on stepping forward 2 steps, back 2 steps 2x at [employer] A and then Side stepping to the L at EOB with RW.        Balance Overall balance assessment: Needs assistance, Mild deficits observed, not formally tested Sitting-balance support: No upper extremity supported, Feet supported Sitting balance-Leahy Scale: Fair     Standing balance support: Bilateral upper extremity supported, During functional activity, Reliant on assistive device for balance Standing balance-Leahy Scale: Poor Standing balance comment: reliant on UE and external support      Communication Communication Communication: Impaired Factors Affecting Communication: Hearing impaired  Cognition Arousal: Alert Behavior During Therapy: WFL for tasks assessed/performed   PT - Cognitive impairments: No apparent impairments     Following commands: Intact      Cueing Cueing Techniques: Verbal cues     General Comments General comments (skin integrity, edema, etc.): BP 104/65 during session. Spouse present and supportive. O2 sats in the upper 90s throughout session on [place]      Pertinent Vitals/Pain Pain Assessment Pain Assessment: Faces Faces Pain Scale: Hurts little more Pain Location: R LE with touch and movement Pain Descriptors / Indicators: Discomfort Pain Intervention(s): Monitored during session, Limited activity within patient's tolerance  PT Goals (current goals can now be found in the care plan section) Acute Rehab PT Goals Patient Stated Goal: to get stronger PT Goal Formulation: With patient Time For Goal Achievement: 07/25/24 Potential to Achieve Goals:  Good Progress towards PT goals: Progressing toward goals    Frequency    Min 2X/week      PT Plan  Continue with current POC        AM-PAC PT 6 Clicks Mobility   Outcome Measure  Help needed turning from your back to your side while in a flat bed without using bedrails?: A Little Help needed moving from lying on your back to sitting on the side of a flat bed without using bedrails?: A Lot Help needed moving to and from a bed to a chair (including a wheelchair)?: A Lot Help needed standing up from a chair using your arms (e.g., wheelchair or bedside chair)?: A Little Help needed to walk in hospital room?: Total Help needed climbing 3-5 steps with a railing? : Total 6 Click Score: 12    End of Session Equipment Utilized During Treatment: Gait belt;Oxygen Activity Tolerance: Patient tolerated treatment well Patient left: in bed;with call bell/phone within reach;with bed alarm set;with family/visitor present Nurse Communication: Mobility status PT Visit Diagnosis: Unsteadiness on feet (R26.81);Other abnormalities of gait and mobility (R26.89);Muscle weakness (generalized) (M62.81)     Time: 8791-8753 PT Time Calculation (min) (ACUTE ONLY): 38 min  Charges:    $Therapeutic Activity: 38-52 mins PT General Charges $$ ACUTE PT VISIT: 1 Visit                     Dorothyann Maier, DPT, CLT  Acute Rehabilitation Services Office: [phone] (Secure chat preferred)    Dorothyann VEAR Maier 07/13/2024, 4:40 PM

## 2024-07-14 DIAGNOSIS — I5033 Acute on chronic diastolic (congestive) heart failure: Secondary | ICD-10-CM | POA: Diagnosis not present

## 2024-07-14 LAB — COMPREHENSIVE METABOLIC PANEL WITH GFR
ALT: 6 U/L (ref 0–44)
AST: 48 U/L — ABNORMAL HIGH (ref 15–41)
Albumin: 3.3 g/dL — ABNORMAL LOW (ref 3.5–5.0)
Alkaline Phosphatase: 54 U/L (ref 38–126)
Anion gap: 5 (ref 5–15)
BUN: 42 mg/dL — ABNORMAL HIGH (ref 8–23)
CO2: 35 mmol/L — ABNORMAL HIGH (ref 22–32)
Calcium: 9.2 mg/dL (ref 8.9–10.3)
Chloride: 103 mmol/L (ref 98–111)
Creatinine, Ser: 0.84 mg/dL (ref 0.61–1.24)
GFR, Estimated: >60 mL/min
Glucose, Bld: 102 mg/dL — ABNORMAL HIGH (ref 70–99)
Potassium: 4.2 mmol/L (ref 3.5–5.1)
Sodium: 143 mmol/L (ref 135–145)
Total Bilirubin: 0.7 mg/dL (ref 0.0–1.2)
Total Protein: 5.5 g/dL — ABNORMAL LOW (ref 6.5–8.1)

## 2024-07-14 LAB — CULTURE, BLOOD (ROUTINE X 2)
Culture: NO GROWTH
Special Requests: ADEQUATE

## 2024-07-14 LAB — BLOOD GAS, VENOUS
Acid-Base Excess: 6.4 mmol/L — ABNORMAL HIGH (ref 0.0–2.0)
Bicarbonate: 35.4 mmol/L — ABNORMAL HIGH (ref 20.0–28.0)
O2 Saturation: 92.3 %
Patient temperature: 37
pCO2, Ven: 72 mmHg (ref 44–60)
pH, Ven: 7.3 (ref 7.25–7.43)
pO2, Ven: 63 mmHg — ABNORMAL HIGH (ref 32–45)

## 2024-07-14 LAB — CBC
HCT: 31.3 % — ABNORMAL LOW (ref 39.0–52.0)
Hemoglobin: 10 g/dL — ABNORMAL LOW (ref 13.0–17.0)
MCH: 35.3 pg — ABNORMAL HIGH (ref 26.0–34.0)
MCHC: 31.9 g/dL (ref 30.0–36.0)
MCV: 110.6 fL — ABNORMAL HIGH (ref 80.0–100.0)
Platelets: 64 K/uL — ABNORMAL LOW (ref 150–400)
RBC: 2.83 MIL/uL — ABNORMAL LOW (ref 4.22–5.81)
RDW: 14 % (ref 11.5–15.5)
WBC: 7.6 K/uL (ref 4.0–10.5)
nRBC: 0 % (ref 0.0–0.2)

## 2024-07-14 LAB — HAPTOGLOBIN: Haptoglobin: 129 mg/dL (ref 34–355)

## 2024-07-14 LAB — AMMONIA: Ammonia: 31 umol/L (ref 9–35)

## 2024-07-14 MED ORDER — LINEZOLID 600 MG PO TABS
600.0000 mg | ORAL_TABLET | Freq: Two times a day (BID) | ORAL | Status: AC
  Administered 2024-07-14 – 2024-07-18 (×10): 600 mg via ORAL
  Filled 2024-07-14 (×10): qty 1

## 2024-07-14 MED ORDER — HYDROCORTISONE SOD SUC (PF) 100 MG IJ SOLR
50.0000 mg | Freq: Two times a day (BID) | INTRAMUSCULAR | Status: AC
  Administered 2024-07-14 – 2024-07-16 (×4): 50 mg via INTRAVENOUS
  Filled 2024-07-14 (×4): qty 1

## 2024-07-14 MED ORDER — THIAMINE HCL 100 MG/ML IJ SOLN
500.0000 mg | Freq: Three times a day (TID) | INTRAVENOUS | Status: AC
  Administered 2024-07-14 – 2024-07-17 (×9): 500 mg via INTRAVENOUS
  Filled 2024-07-14: qty 500
  Filled 2024-07-14: qty 5
  Filled 2024-07-14: qty 500
  Filled 2024-07-14 (×3): qty 5
  Filled 2024-07-14: qty 500
  Filled 2024-07-14: qty 5
  Filled 2024-07-14: qty 500

## 2024-07-14 MED ORDER — HYDROCORTISONE SOD SUC (PF) 100 MG IJ SOLR
50.0000 mg | Freq: Every day | INTRAMUSCULAR | Status: AC
  Administered 2024-07-16: 50 mg via INTRAVENOUS
  Filled 2024-07-14: qty 1

## 2024-07-14 MED ORDER — THIAMINE MONONITRATE 100 MG PO TABS: 100.0000 mg | ORAL_TABLET | Freq: Every day | ORAL | Status: DC

## 2024-07-14 MED ORDER — THIAMINE HCL 100 MG/ML IJ SOLN
250.0000 mg | Freq: Every day | INTRAVENOUS | Status: DC
  Administered 2024-07-17 – 2024-07-18 (×2): 250 mg via INTRAVENOUS
  Filled 2024-07-14 (×2): qty 2.5

## 2024-07-14 NOTE — Progress Notes (Signed)
 Patient resting comfortably with bipap on standby.  No respiratory distress noted at this time.  Will continue to monitor.

## 2024-07-14 NOTE — Plan of Care (Signed)
" °  Problem: Education: Goal: Knowledge of General Education information will improve Description: Including pain rating scale, medication(s)/side effects and non-pharmacologic comfort measures Outcome: Progressing   Problem: Clinical Measurements: Goal: Will remain free from infection Outcome: Progressing   Problem: Clinical Measurements: Goal: Diagnostic test results will improve Outcome: Progressing   Problem: Clinical Measurements: Goal: Respiratory complications will improve Outcome: Progressing   Problem: Activity: Goal: Risk for activity intolerance will decrease Outcome: Progressing   Problem: Nutrition: Goal: Adequate nutrition will be maintained Outcome: Progressing    Problem: Elimination: Goal: Will not experience complications related to urinary retention Outcome: Progressing   Problem: Pain Managment: Goal: General experience of comfort will improve and/or be controlled Outcome: Progressing   Problem: Activity: Goal: Capacity to carry out activities will improve Outcome: Progressing   "

## 2024-07-14 NOTE — Plan of Care (Signed)
  Problem: Clinical Measurements: Goal: Ability to maintain clinical measurements within normal limits will improve Outcome: Progressing Goal: Will remain free from infection Outcome: Progressing Goal: Respiratory complications will improve Outcome: Progressing Goal: Cardiovascular complication will be avoided Outcome: Progressing   Problem: Pain Managment: Goal: General experience of comfort will improve and/or be controlled Outcome: Progressing

## 2024-07-14 NOTE — Progress Notes (Addendum)
 " PROGRESS NOTE    [name]  FMW:[id number] DOB: 1951-05-20 DOA: 07/09/2024 PCP: Candise Aleene DEL, MD  Chief Complaint  Patient presents with   Leg Swelling    Brief Narrative:   [name] is [name] 73 y.o. 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    Patient very drowsy during virtual admission encounter and also hard of hearing therefore his daughter at bedside assisted with providing history.  Patient had been seen by cardiology on Thursday (12/11) due to his worsening leg swelling.  His oral diuretic regimen was increased but apparently without improvement, swelling continue to worsen and patient began complaining of more shortness of breath, dyspnea on exertion and orthopnea.   He's been admitted with Shantavia Jha heart failure exacerbation and concern for right lower extremity cellulitis.   Significant Events 07/12/2024 - Moved to ICU for septic shock needing levophed . 12/19 shock state drastically improved, pressors weaned off this Darcel Zick.m. TRH reassumed care on 12/20    Assessment & Plan:   Principal Problem:   Acute on chronic heart failure with preserved ejection fraction (HFpEF) (HCC) Active Problems:   Cellulitis of right lower extremity   AKI (acute kidney injury)   Coronary artery disease   HTN (hypertension)   Bilateral lower extremity edema   Elevated CK   Myositis   Hyperlipidemia   Weakness   Chronic radicular lumbar pain   History of DVT (deep vein thrombosis)   Septic shock (HCC)  Septic Shock due to Cellulitis  Chronic LE Wounds Developed hypothermia, hypotension while being treated with ancef  on 12/18 - now s/p transfer to ICU for pressors TSH, free T4  wnl Cortisol did not suggest adrenal insufficiency Wean stress dose steroids as tolerated Blood culture 12/15 ngtd Currently on vanc, cefepime  - will narrow as tolerated, failed treatment with first generation cephalosporin -  Note he's been on levaquin  chronically in the past, with previous wound cultures with pseudomonas (though difficult to tell when the most recent culture was).  Given failure on ancef , may need to continue therapy covering both MRSA and pseudomonas.   CT chest with R middle lobe and R lower lobe atelectasis, mild patchy peripheral reticulation in R greater than L lungs CT renal stone study without acute findings CT tib fib pending - no crepitus or fluctuance on exam today, but significant induration Appreciate wound care recs  Acute Metabolic Encephalopathy Lethargic today, follow ammonia and VBG 6:24 PM on reevaluation, more alert this evening TSH wnl, b12 wnl,  Was on CIWA for his drinking hx TSH wnl, b12 and folate wnl  Acute on chronic heart failure with preserved ejection fraction (HFpEF) (HCC) Patient presenting with progressively worsening swelling, dyspnea worse on exertion, orthopnea with progressive symptoms despite increase in oral diuretics just 4 days ago as outpatient with cardiology.   Last echo in November 2024 was poor acoustic windows and limited but showed normal EF 55 to 60%, no LV regional WMA's, mild LVH, mild MR. lasix  on hold, will defer resumption to cards Appreciate cardiology consult Follow repeat echo  - with preserved EF, normal RVSF Strict I/O, daily weights   Chronic Shortness of Breath Paralyzed R Hemidiaphragm  Respiratory Acidosis  Will continue bipap at night Diuresis as noted above CT chest with prominent  chronic elevation of R hemidiaphragm, complete R middle lobe and near complete R lower lobe atelectasis - chronic mild patchy peripheral reticulation in the R greater than L lungs  AKI (acute kidney injury) Moderate Right  Hydronephrosis Echogenic Lesion in Anterior Bladder Creatinine on admission 1.89, up from 0.92 in mid October.   Creatinine improving now Note question echogenic lesion in anterior bladder on renal US , will need CT abd/pelvis with delayed bladder imaging at some time (when more stable)  Elevated CK Etiology unclear, CK elevated at 1802 on admission.  No reported trauma history or prolonged downtime, but patient is quite sedentary at baseline.  He had Zosia Lucchese normal CK level 1 year ago at 182.  Does have baseline inclusion body myositis. Holding statin for now Will continue to trend --mildly elevated AST - trend --Check urine myoglobin normal   BPH Urinary Retention Flomax  Now with indwelling catheter, consider TOV in [place] few days  HTN (hypertension) Holding home lisinopril  with AKI above atenolol  on hold   Coronary artery disease Elevated troponin -mild, and trended down, 169>> 15.  Patient has no complaints of chest pain and EKG showed no acute ischemic changes. Atenolol , crestor  on hold Echocardiogram with preserved EF, unable to evaluate regional wall motion   History of DVT (deep vein thrombosis) On Eliquis  2.5 mg twice daily and daughter reports good compliance with this. Right lower extremity venous Doppler ultrasound was negative for DVT in the ED. 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 PT/OT     DVT prophylaxis: eliquis  Code Status: full Family Communication: wife 12/20  Disposition:   Status is: Observation The patient remains OBS appropriate and will d/c before 2 midnights.   Consultants:  cardiology  Procedures:  none  Antimicrobials:  Anti-infectives (From admission, onward)    Start     Dose/Rate Route Frequency Ordered Stop   07/13/24 1800  vancomycin  (VANCOCIN ) IVPB 1000 mg/200 mL premix  Status:  Discontinued        1,000 mg 200 mL/hr over 60 Minutes Intravenous Every 24 hours 07/12/24 1608 07/13/24 1019   07/13/24 1400  vancomycin  (VANCOREADY) IVPB 1750 mg/350 mL        1,750 mg 175 mL/hr over 120 Minutes Intravenous Every 24 hours 07/13/24 1019     07/12/24 2300  cefTRIAXone (ROCEPHIN) 1 g in sodium chloride  0.9 % 100 mL IVPB  Status:  Discontinued        1 g 200 mL/hr over 30 Minutes Intravenous Every 24 hours 07/12/24 1546 07/12/24 1602   07/12/24 1800  ceFEPIme  (MAXIPIME ) 2 g in sodium chloride  0.9 % 100 mL IVPB  Status:  Discontinued        2 g 200 mL/hr over 30 Minutes Intravenous 2 times daily 07/12/24 1614 07/12/24 1648   07/12/24 1700  vancomycin  (VANCOREADY) IVPB 2000 mg/400 mL  Status:  Discontinued  2,000 mg 200 mL/hr over 120 Minutes Intravenous  Once 07/12/24 1608 07/12/24 1648   07/12/24 1700  vancomycin  (VANCOREADY) IVPB 2000 mg/400 mL        2,000 mg 200 mL/hr over 120 Minutes Intravenous STAT 07/12/24 1648 07/12/24 2001   07/12/24 1700  ceFEPIme  (MAXIPIME ) 2 g in sodium chloride  0.9 % 100 mL IVPB        2 g 200 mL/hr over 30 Minutes Intravenous 2 times daily 07/12/24 1648     07/09/24 1745  ceFAZolin  (ANCEF ) IVPB 2g/100 mL premix  Status:  Discontinued        2 g 200 mL/hr over 30 Minutes Intravenous Every 8 hours 07/09/24 1734 07/12/24 1546   07/09/24 1500  vancomycin  (VANCOCIN ) IVPB 1000 mg/200 mL premix       Placed in Followed by Linked Group    1,000 mg 200 mL/hr over 60 Minutes Intravenous  Once 07/09/24 1406 07/09/24 1808   07/09/24 1415  vancomycin  (VANCOCIN ) IVPB 1000 mg/200 mL premix  Status:  Discontinued        1,000 mg 200 mL/hr over 60 Minutes Intravenous  Once 07/09/24 1404 07/09/24 1406   07/09/24 1415  ceFEPIme  (MAXIPIME ) 2 g in sodium chloride  0.9 % 100 mL IVPB        2 g 200 mL/hr over 30 Minutes Intravenous  Once 07/09/24 1404 07/09/24 1525   07/09/24 1415  vancomycin  (VANCOCIN ) IVPB 1000 mg/200 mL premix       Placed in Followed by Linked Group   1,000 mg 200 mL/hr over 60 Minutes Intravenous  Once 07/09/24 1406 07/09/24 1658       Subjective: Daughter Veronda at bedside He has no specific complaints - no pain, notes some SOB  Objective: Vitals:   07/14/24 0053 07/14/24 0437 07/14/24 0551 07/14/24 0821  BP:  (!) 117/53 (!) 109/50 (!) 127/54  Pulse: 79 75 82 92  Resp: 16 16 20 16   Temp:   97.7 F (36.5 C) 97.8 F (36.6 C)  TempSrc:   Oral Oral  SpO2: 100% 100% 97% 100%  Weight:   (!) 139.7 kg   Height:        Intake/Output Summary (Last 24 hours) at 07/14/2024 1011 Last data filed at 07/14/2024 9176 Gross per 24 hour  Intake 1304.22 ml  Output 2100 ml  Net -795.78 ml   Filed Weights   07/13/24 0500 07/13/24 1820 07/14/24 0551  Weight: (!) 138.6 kg (!) 141.2 kg (!) 139.7 kg    Examination:  General: No acute distress. Cardiovascular: RRR Lungs: increased WOB - distant on anterior exam Abdomen: Soft, nontender, nondistended Neurological: Alert and oriented 3 - but more lethargic today, falling asleep Ahmyah Gidley few times during my interview. Moves all extremities 4. Cranial nerves II through XII grossly intact.  No asterixis. Skin: Warm and dry. No rashes or lesions. Extremities: erythema and induration to RLE (edema improved after leg in compression with ace), no notable fluctuance or crepitus - no notable TTP.  Bilateral LE edema.    Data Reviewed: I have personally reviewed following labs  and imaging studies  CBC: Recent Labs  Lab 07/09/24 1230 07/09/24 1833 07/11/24 0504 07/12/24 0421 07/12/24 1332 07/12/24 1807 07/13/24 0006 07/13/24 0906 07/13/24 1137 07/14/24 0851  WBC 6.3  --  12.1* 8.6  --   --   --  8.4  --  7.6  NEUTROABS  --   --  9.7* 6.5  --   --   --  7.5  --   --  HGB 11.2*   < > 10.9* 9.8*   < > 9.8* 8.2* 9.9* 9.6* 10.0*  HCT 34.5*   < > 33.5* 29.9*   < > 30.3* 25.9* 30.6* 29.7* 31.3*  MCV 106.5*  --  108.1* 109.1*  --   --   --  109.3*  --  110.6*  PLT 71*  --  88* 74*  --   --   --  67*  --  64*   < > = values in this interval not displayed.    Basic Metabolic Panel: Recent Labs  Lab 07/10/24 0514 07/11/24 0504 07/12/24 0421 07/12/24 1733 07/13/24 0906 07/14/24 0851  NA 142 140 137 135 139 143  K 4.5 4.7 4.0 4.2 4.5 4.2  CL 101 99 98  --  101 103  CO2 31 30 30   --  29 35*  GLUCOSE 118* 105* 112*  --  119* 102*  BUN 41* 47* 60*  --  53* 42*  CREATININE 1.48* 1.46* 1.85*  --  1.21 0.84  CALCIUM  9.3 9.2 8.7*  --  9.1 9.2  MG  --  2.2 2.2  --  2.3  --   PHOS  --  3.3 4.7*  --  3.4  --     GFR: Estimated Creatinine Clearance (by C-G formula based on SCr of 0.84 mg/dL) Male: 11.3 mL/min Male: 107.2 mL/min  Liver Function Tests: Recent Labs  Lab 07/09/24 1500 07/11/24 0504 07/12/24 0421 07/13/24 0906 07/14/24 0851  AST 104* 115* 101* 67* 48*  ALT 35 21 8 <5 6  ALKPHOS 77 58 55 55 54  BILITOT 0.6 0.6 0.4 0.6 0.7  PROT 6.6 5.8* 5.0* 5.3* 5.5*  ALBUMIN 3.7 3.3* 3.0* 3.2* 3.3*    CBG: Recent Labs  Lab 07/12/24 2309 07/13/24 0317 07/13/24 0728 07/13/24 1125 07/13/24 1522  GLUCAP 126* 136* 123* 139* 102*     Recent Results (from the past 240 hours)  Culture, blood (routine x 2)     Status: None   Collection Time: 07/09/24  2:40 PM   Specimen: BLOOD  Result Value Ref Range Status   Specimen Description   Final    BLOOD LEFT ANTECUBITAL Performed at [facility], [address]  [place], [place] [address]    Special Requests   Final    BOTTLES DRAWN AEROBIC AND ANAEROBIC Blood Culture adequate volume Performed at [facility], [address] [place], [place] [address]    Culture   Final    NO GROWTH 5 DAYS Performed at [facility], [address] [place], [place] [address]    Report Status 07/14/2024 FINAL  Final  MRSA Next Gen by PCR, Nasal     Status: Abnormal   Collection Time: 07/12/24  5:09 PM   Specimen: Nasal Mucosa; Nasal Swab  Result Value Ref Range Status   MRSA by PCR Next Gen DETECTED (Rachid Parham) NOT DETECTED Final    Comment: RESULT CALLED TO, READ BACK BY AND VERIFIED WITH: RN DOROTHA KU [phone] @ [phone] (NOTE) The GeneXpert MRSA Assay (FDA approved for NASAL specimens only), is one component of [facility] comprehensive MRSA colonization surveillance program. It is not intended to diagnose MRSA infection nor to guide or monitor treatment for MRSA infections. Test performance is not FDA approved in patients less than 44 years old. Performed at [facility], [address], [place], [place] [address]          Radiology Studies: CT  CHEST WO CONTRAST Result Date: 07/13/2024 EXAM: CHEST CT WITHOUT CONTRAST 07/12/2024 10:18:45 PM TECHNIQUE: CT of the chest was performed without the administration of intravenous contrast. Multiplanar reformatted images are provided for review. Automated exposure control, iterative reconstruction, and/or weight based adjustment of the mA/kV was utilized to reduce the radiation dose to as low as reasonably achievable. COMPARISON: 06/29/23 high resolution chest CT. Chest radiograph from earlier today. CLINICAL HISTORY: Respiratory illness, nondiagnostic xray. FINDINGS: MEDIASTINUM: Three vessel coronary atherosclerosis. Atherosclerotic thoracic aorta with dilated 4.1 cm ascending thoracic aorta. Dilated main pulmonary artery (4.2 cm diameter). The central airways are clear. LYMPH NODES: No mediastinal,  hilar or axillary lymphadenopathy. LUNGS AND PLEURA: No pleural effusion or pneumothorax. Stable subcentimeter calcified left lower lobe granulomas. No lung masses or significant pulmonary nodules. Chronic prominent elevation of the right hemidiaphragm. Complete right middle lobe and near complete right lower lobe atelectasis. Subsegmental left lung base atelectasis. Chronic mild patchy peripheral reticulation in the right greater than left lungs is unchanged. SOFT TISSUES AND BONES: Marked thoracic spondylosis. No acute abnormality of the soft tissues. UPPER ABDOMEN: Limited images of the upper abdomen demonstrate Athalene Kolle nonobstructing 9 mm upper right renal stone. IMPRESSION: 1. Chronic prominent elevation of the right hemidiaphragm. Complete right middle lobe and near complete right lower lobe atelectasis. 2. Subsegmental left lung base atelectasis. 3. Chronic mild patchy peripheral reticulation in the right greater than left lungs, unchanged. 4. Dilated main pulmonary artery, suggesting pulmonary arterial hypertension. 5. Dilated 4.1 cm ascending thoracic aorta. Follow-up chest CT angiogram advised in 12 months. 6. Three vessel coronary atherosclerosis. 7. Nonobstructing 9 mm upper right renal stone. 8. Aortic Atherosclerosis (ICD10-I70.0). 9. Aortic aneurysm NOS (ICD10-I71.9). Electronically signed by: Jason Poff MD 07/13/2024 07:20 AM EST RP Workstation: [id number]        Scheduled Meds:  sodium chloride    Intravenous Once   apixaban   2.5 mg Oral BID   bisacodyl   5 mg Oral Daily   Chlorhexidine  Gluconate Cloth  6 each Topical Daily   folic acid   1 mg Oral Daily   Gerhardt's butt cream   Topical Daily   hydrocortisone  sod succinate (SOLU-CORTEF ) inj  100 mg Intravenous Q12H   lidocaine   1 patch Transdermal Q24H   melatonin  10 mg Oral QHS   multivitamin with minerals  1 tablet Oral Daily   mupirocin  ointment  1 Application Nasal BID   polyethylene glycol  17 g Oral Daily   sodium chloride  flush   10-40 mL Intracatheter Q12H   tamsulosin   0.4 mg Oral Daily   thiamine   100 mg Oral Daily   Or   thiamine   100 mg Intravenous Daily   Continuous Infusions:  ceFEPime  (MAXIPIME ) IV 2 g (07/13/24 2149)   vancomycin  Stopped (07/13/24 1527)     LOS: 4 days    Time spent: 50 min critical care time with hypotension, hypothermia.    Meliton Monte, MD [facility]   To contact the attending provider between 7A-7P or the covering provider during after hours 7P-7A, please log into the web site [email or web address] and access using universal [place] password for that web site. If you do not have the password, please call the hospital operator.  07/14/2024, 10:11 AM    "

## 2024-07-14 NOTE — Progress Notes (Signed)
"  °  Progress Note  Patient Name: [name] Date of Encounter: 07/14/2024 [facility] Cardiologist: Vina Gull, MD   Interval Summary   No events overnight. He coughed up some phlegm after eating but denies any dyspnea. He does report some LE edema.  Vital Signs Vitals:   07/14/24 0053 07/14/24 0437 07/14/24 0551 07/14/24 0821  BP:  (!) 117/53 (!) 109/50 (!) 127/54  Pulse: 79 75 82 92  Resp: 16 16 20 16   Temp:   97.7 F (36.5 C) 97.8 F (36.6 C)  TempSrc:   Oral Oral  SpO2: 100% 100% 97% 100%  Weight:   (!) 139.7 kg   Height:        Intake/Output Summary (Last 24 hours) at 07/14/2024 1044 Last data filed at 07/14/2024 9176 Gross per 24 hour  Intake 1304.22 ml  Output 2100 ml  Net -795.78 ml      07/14/2024    5:51 AM 07/13/2024    6:20 PM 07/13/2024    5:00 AM  Last 3 Weights  Weight (lbs) 307 lb 15.7 oz 311 lb 4.6 oz 305 lb 8.9 oz  Weight (kg) 139.7 kg 141.2 kg 138.6 kg      Telemetry/ECG  Intermittent PVCs, low burden - Personally Reviewed  Physical Exam  GEN: No acute distress.   Neck: No JVD Cardiac: RRR, no murmurs, rubs, or gallops.  Respiratory: Clear to auscultation bilaterally. GI: Soft, nontender, non-distended  MS: 2+ non-pitting edema bilaterally  Assessment & Plan  [name] is a 43 yoM with Hx of CAD s/p LAD PCI (2009), RCA (2015), lymphedema, DVT who presented with volume overload and subsequently transferred to ICU for possible sepsis. TTE 12/25 with EF 60-65%  #Acute on chronic diastolic CHF #Lymphedema #CAD s/p PCI x 2 #DVT - Feels well today with no symptoms of volume overload. He does have some LE edema, but nonpitting and thus likely in setting of his lymphedema. - Given recent sepsis and hypotension, would hold diuretics for remaining inpatient stay and re-start home torsemide  20 mg on discharge - We will sign off and arrange follow up; feel free to call us  back if you have any questions  For questions or updates, please  contact [facility] Please consult [email or web address] for contact info under   Signed, Joelle VEAR Ren Donley, MD  "

## 2024-07-15 ENCOUNTER — Other Ambulatory Visit (HOSPITAL_COMMUNITY): Payer: Self-pay

## 2024-07-15 DIAGNOSIS — I5033 Acute on chronic diastolic (congestive) heart failure: Secondary | ICD-10-CM | POA: Diagnosis not present

## 2024-07-15 LAB — COMPREHENSIVE METABOLIC PANEL WITH GFR
ALT: 6 U/L (ref 0–44)
AST: 40 U/L (ref 15–41)
Albumin: 3.2 g/dL — ABNORMAL LOW (ref 3.5–5.0)
Alkaline Phosphatase: 50 U/L (ref 38–126)
Anion gap: 4 — ABNORMAL LOW (ref 5–15)
BUN: 36 mg/dL — ABNORMAL HIGH (ref 8–23)
CO2: 34 mmol/L — ABNORMAL HIGH (ref 22–32)
Calcium: 9.5 mg/dL (ref 8.9–10.3)
Chloride: 104 mmol/L (ref 98–111)
Creatinine, Ser: 0.74 mg/dL (ref 0.61–1.24)
GFR, Estimated: >60 mL/min
Glucose, Bld: 115 mg/dL — ABNORMAL HIGH (ref 70–99)
Potassium: 4.9 mmol/L (ref 3.5–5.1)
Sodium: 141 mmol/L (ref 135–145)
Total Bilirubin: 0.6 mg/dL (ref 0.0–1.2)
Total Protein: 5.3 g/dL — ABNORMAL LOW (ref 6.5–8.1)

## 2024-07-15 LAB — CBC WITH DIFFERENTIAL/PLATELET
Abs Immature Granulocytes: 0.07 K/uL (ref 0.00–0.07)
Basophils Absolute: 0 K/uL (ref 0.0–0.1)
Basophils Relative: 0 %
Eosinophils Absolute: 0 K/uL (ref 0.0–0.5)
Eosinophils Relative: 0 %
HCT: 30.3 % — ABNORMAL LOW (ref 39.0–52.0)
Hemoglobin: 9.6 g/dL — ABNORMAL LOW (ref 13.0–17.0)
Immature Granulocytes: 1 %
Lymphocytes Relative: 9 %
Lymphs Abs: 0.7 K/uL (ref 0.7–4.0)
MCH: 35.3 pg — ABNORMAL HIGH (ref 26.0–34.0)
MCHC: 31.7 g/dL (ref 30.0–36.0)
MCV: 111.4 fL — ABNORMAL HIGH (ref 80.0–100.0)
Monocytes Absolute: 0.7 K/uL (ref 0.1–1.0)
Monocytes Relative: 10 %
Neutro Abs: 5.5 K/uL (ref 1.7–7.7)
Neutrophils Relative %: 80 %
Platelets: 64 K/uL — ABNORMAL LOW (ref 150–400)
RBC: 2.72 MIL/uL — ABNORMAL LOW (ref 4.22–5.81)
RDW: 14.1 % (ref 11.5–15.5)
WBC: 7 K/uL (ref 4.0–10.5)
nRBC: 0 % (ref 0.0–0.2)

## 2024-07-15 LAB — GLUCOSE, CAPILLARY: Glucose-Capillary: 127 mg/dL — ABNORMAL HIGH (ref 70–99)

## 2024-07-15 LAB — MAGNESIUM: Magnesium: 2.4 mg/dL (ref 1.7–2.4)

## 2024-07-15 LAB — CK: Total CK: 85 U/L (ref 49–397)

## 2024-07-15 LAB — PHOSPHORUS: Phosphorus: 2.4 mg/dL — ABNORMAL LOW (ref 2.5–4.6)

## 2024-07-15 MED ORDER — SENNOSIDES-DOCUSATE SODIUM 8.6-50 MG PO TABS
1.0000 | ORAL_TABLET | Freq: Every day | ORAL | Status: DC
  Administered 2024-07-15 – 2024-07-20 (×6): 1 via ORAL
  Filled 2024-07-15 (×6): qty 1

## 2024-07-15 NOTE — Plan of Care (Signed)
  Problem: Clinical Measurements: Goal: Diagnostic test results will improve Outcome: Progressing Goal: Respiratory complications will improve Outcome: Progressing Goal: Cardiovascular complication will be avoided Outcome: Progressing   

## 2024-07-15 NOTE — Progress Notes (Signed)
 " PROGRESS NOTE    [name]  FMW:[id number] DOB: Dec 11, 1950 DOA: 07/09/2024 PCP: Candise Aleene DEL, MD  Chief Complaint  Patient presents with   Leg Swelling    Brief Narrative:   [name] is a 73 y.o. 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    Patient very drowsy during virtual admission encounter and also hard of hearing therefore his daughter at bedside assisted with providing history.  Patient had been seen by cardiology on Thursday (12/11) due to his worsening leg swelling.  His oral diuretic regimen was increased but apparently without improvement, swelling continue to worsen and patient began complaining of more shortness of breath, dyspnea on exertion and orthopnea.   He's been admitted with a heart failure exacerbation and concern for right lower extremity cellulitis.   Significant Events 07/12/2024 - Moved to ICU for septic shock needing levophed . 12/19 shock state drastically improved, pressors weaned off this a.m. TRH reassumed care on 12/20   12/21 Patient is resting comfortably. No new complaints.  Assessment & Plan:   Principal Problem:   Acute on chronic heart failure with preserved ejection fraction (HFpEF) (HCC) Active Problems:   Cellulitis of right lower extremity   AKI (acute kidney injury)   Coronary artery disease   HTN (hypertension)   Bilateral lower extremity edema   Elevated CK   Myositis   Hyperlipidemia   Weakness   Chronic radicular lumbar pain   History of DVT (deep vein thrombosis)   Septic shock (HCC)  Septic Shock due to Cellulitis  Chronic LE Wounds Developed hypothermia, hypotension while being treated with ancef  on 12/18 - now  s/p transfer to ICU for pressors. Pt came out to floor on 07/14/2024.  TSH, free T4 wnl Cortisol did not suggest adrenal insufficiency Wean stress dose steroids as tolerated Blood culture 12/15 ngtd. PCR for MRSA positive. Currently on vanc, cefepime  - will narrow as tolerated, failed treatment with first generation cephalosporin -  Note he's been on levaquin  chronically in the past, with previous wound cultures with pseudomonas (though difficult to tell when the most recent culture was).  Given failure on ancef , may need to continue therapy covering both MRSA and pseudomonas.   CT chest with R middle lobe and R lower lobe atelectasis, mild patchy peripheral reticulation in R greater than L lungs CT renal stone study without acute findings CT tib fib pending - no crepitus or fluctuance on exam today, but significant induration Appreciate wound care recs  Acute Metabolic Encephalopathy Lethargic today, follow ammonia and VBG 5:41 PM on reevaluation, more alert this evening TSH wnl, b12 wnl,  Was on CIWA for his drinking hx TSH wnl, b12 and folate wnl  Acute on chronic heart failure with preserved ejection fraction (HFpEF) (HCC) Patient presenting with progressively worsening swelling, dyspnea worse on exertion, orthopnea with progressive symptoms despite increase in oral diuretics just 4 days ago as outpatient with cardiology.   Last echo in November 2024 was poor acoustic windows and limited but showed normal EF 55 to 60%, no LV regional WMA's, mild LVH, mild MR. lasix  on hold, will defer resumption to cards Appreciate cardiology consult Follow repeat echo  - with preserved EF, normal RVSF Strict I/O, daily weights   Chronic Shortness of Breath  Paralyzed R Hemidiaphragm  Respiratory Acidosis  Will continue bipap at night Diuresis as noted above CT chest with prominent chronic elevation of R hemidiaphragm, complete R middle lobe and near complete R lower lobe atelectasis - chronic mild  patchy peripheral reticulation in the R greater than L lungs  AKI (acute kidney injury) Moderate Right Hydronephrosis Echogenic Lesion in Anterior Bladder Creatinine on admission 1.89, up from 0.92 in mid October.   Creatinine improving now Note question echogenic lesion in anterior bladder on renal US , will need CT abd/pelvis with delayed bladder imaging at some time (when more stable)  Elevated CK Etiology unclear, CK elevated at 1802 on admission.  No reported trauma history or prolonged downtime, but patient is quite sedentary at baseline.  He had a normal CK level 1 year ago at 182.  Does have baseline inclusion body myositis. Holding statin for now Will continue to trend --mildly elevated AST - trend --Check urine myoglobin normal   BPH Urinary Retention Flomax  Now with indwelling catheter, consider TOV in a few days  HTN (hypertension) Holding home lisinopril  with AKI above atenolol  on hold   Coronary artery disease Elevated troponin -mild, and trended down, 169>> 15.  Patient has no complaints of chest pain and EKG showed no acute ischemic changes. Atenolol , crestor  on hold Echocardiogram with preserved EF, unable to evaluate regional wall motion   History of DVT (deep vein thrombosis) On Eliquis  2.5 mg twice daily and daughter reports good compliance with this. Right lower extremity venous Doppler ultrasound was negative for DVT in the ED. 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 PT/OT     DVT prophylaxis: eliquis  Code Status: full Family Communication: wife 12/20  Disposition:   Status is: Observation The patient remains OBS appropriate and will d/c before 2 midnights.   Consultants:  cardiology  Procedures:  none  Antimicrobials:  Anti-infectives (From admission, onward)    Start     Dose/Rate Route Frequency Ordered Stop   07/14/24 1400  linezolid  (ZYVOX ) tablet 600 mg        600 mg Oral Every 12 hours 07/14/24 1313     07/13/24 1800  vancomycin  (VANCOCIN ) IVPB 1000 mg/200 mL premix  Status:  Discontinued        1,000 mg 200 mL/hr over 60 Minutes Intravenous Every 24 hours 07/12/24 1608 07/13/24 1019   07/13/24 1400  vancomycin  (VANCOREADY) IVPB 1750 mg/350 mL  Status:  Discontinued        1,750 mg 175 mL/hr over 120 Minutes Intravenous Every 24 hours 07/13/24 1019 07/14/24 1312   07/12/24 2300  cefTRIAXone (ROCEPHIN) 1 g in sodium chloride  0.9 % 100 mL IVPB  Status:  Discontinued        1 g 200 mL/hr over 30 Minutes Intravenous Every 24 hours 07/12/24 1546 07/12/24 1602   07/12/24 1800  ceFEPIme  (MAXIPIME ) 2 g in sodium  chloride 0.9 % 100 mL IVPB  Status:  Discontinued        2 g 200 mL/hr over 30 Minutes Intravenous 2 times daily 07/12/24 1614 07/12/24 1648   07/12/24 1700  vancomycin  (VANCOREADY) IVPB 2000 mg/400 mL  Status:  Discontinued        2,000 mg 200 mL/hr over 120 Minutes Intravenous  Once 07/12/24 1608 07/12/24 1648   07/12/24 1700  vancomycin  (VANCOREADY) IVPB 2000 mg/400 mL        2,000 mg 200 mL/hr over 120 Minutes Intravenous STAT 07/12/24 1648 07/12/24 2001   07/12/24 1700  ceFEPIme  (MAXIPIME ) 2 g in sodium chloride  0.9 % 100 mL IVPB        2 g 200 mL/hr over 30 Minutes Intravenous 2 times daily 07/12/24 1648     07/09/24 1745  ceFAZolin  (ANCEF ) IVPB 2g/100 mL  premix  Status:  Discontinued        2 g 200 mL/hr over 30 Minutes Intravenous Every 8 hours 07/09/24 1734 07/12/24 1546   07/09/24 1500  vancomycin  (VANCOCIN ) IVPB 1000 mg/200 mL premix       Placed in Followed by Linked Group   1,000 mg 200 mL/hr over 60 Minutes Intravenous  Once 07/09/24 1406 07/09/24 1808   07/09/24 1415  vancomycin  (VANCOCIN ) IVPB 1000 mg/200 mL premix  Status:  Discontinued        1,000 mg 200 mL/hr over 60 Minutes Intravenous  Once 07/09/24 1404 07/09/24 1406   07/09/24 1415  ceFEPIme  (MAXIPIME ) 2 g in sodium chloride  0.9 % 100 mL IVPB        2 g 200 mL/hr over 30 Minutes Intravenous  Once 07/09/24 1404 07/09/24 1525   07/09/24 1415  vancomycin  (VANCOCIN ) IVPB 1000 mg/200 mL premix       Placed in Followed by Linked Group   1,000 mg 200 mL/hr over 60 Minutes Intravenous  Once 07/09/24 1406 07/09/24 1658       Subjective: Daughter Veronda at bedside He has no specific complaints - no pain, notes some SOB  Objective: Vitals:   07/15/24 0529 07/15/24 0739 07/15/24 1210 07/15/24 1550  BP:  (!) 101/52 124/68   Pulse: 69 68 84   Resp:  18 18 18   Temp:  97.8 F (36.6 C) (!) 96.8 F (36 C) (!) 96 F (35.6 C)  TempSrc:  Axillary Oral Axillary  SpO2: 95% 92% 98%   Weight: (!) 139 kg     Height:        Intake/Output Summary (Last 24 hours) at 07/15/2024 1741 Last data filed at 07/15/2024 1600 Gross per 24 hour  Intake 1295 ml  Output 2200 ml  Net -905 ml   Filed Weights   07/13/24 1820 07/14/24 0551 07/15/24 0529  Weight: (!) 141.2 kg (!) 139.7 kg (!) 139 kg    Examination:  Exam:  Constitutional:  The patient is awake, alert, and oriented x 3. No acute distress. Respiratory:  No increased work of breathing. No wheezes, rales, or rhonchi No tactile fremitus Cardiovascular:  Regular rate and rhythm No murmurs, ectopy, or gallups. No lateral PMI. No thrills. Abdomen:  Abdomen is soft, non-tender, non-distended No hernias, masses, or  organomegaly Normoactive bowel sounds.  Musculoskeletal:  No cyanosis, clubbing, or edema Skin:  No rashes, lesions, ulcers palpation of skin: no induration or nodules Decreased erythema and warmth. Still tender right lower extremity. Neurologic:  CN 2-12 intact Sensation all 4 extremities intact Psychiatric:  Mental status Mood, affect appropriate Orientation  to person, place, time  judgment and insight appear intact    Data Reviewed: I have personally reviewed following labs and imaging studies  CBC: Recent Labs  Lab 07/11/24 0504 07/12/24 0421 07/12/24 1332 07/13/24 0006 07/13/24 0906 07/13/24 1137 07/14/24 0851 07/15/24 0259  WBC 12.1* 8.6  --   --  8.4  --  7.6 7.0  NEUTROABS 9.7* 6.5  --   --  7.5  --   --  5.5  HGB 10.9* 9.8*   < > 8.2* 9.9* 9.6* 10.0* 9.6*  HCT 33.5* 29.9*   < > 25.9* 30.6* 29.7* 31.3* 30.3*  MCV 108.1* 109.1*  --   --  109.3*  --  110.6* 111.4*  PLT 88* 74*  --   --  67*  --  64* 64*   < > = values in this interval not displayed.    Basic Metabolic Panel: Recent Labs  Lab 07/11/24 0504 07/12/24 0421 07/12/24 1733 07/13/24 0906 07/14/24 0851 07/15/24 0259  NA 140 137 135 139 143 141  K 4.7 4.0 4.2 4.5 4.2 4.9  CL 99 98  --  101 103 104  CO2 30 30  --  29 35* 34*  GLUCOSE 105* 112*  --  119* 102* 115*  BUN 47* 60*  --  53* 42* 36*  CREATININE 1.46* 1.85*  --  1.21 0.84 0.74  CALCIUM  9.2 8.7*  --  9.1 9.2 9.5  MG 2.2 2.2  --  2.3  --  2.4  PHOS 3.3 4.7*  --  3.4  --  2.4*    GFR: Estimated Creatinine Clearance (by C-G formula based on SCr of 0.74 mg/dL) Male: 07.1 mL/min Male: 112.2 mL/min  Liver Function Tests: Recent Labs  Lab 07/11/24 0504 07/12/24 0421 07/13/24 0906 07/14/24 0851 07/15/24 0259  AST 115* 101* 67* 48* 40  ALT 21 8 5 6 6   ALKPHOS 58 55 55 54 50  BILITOT 0.6 0.4 0.6 0.7 0.6  PROT 5.8* 5.0* 5.3* 5.5* 5.3*  ALBUMIN 3.3* 3.0* 3.2* 3.3* 3.2*    CBG: Recent Labs  Lab 07/12/24 2309  07/13/24 0317 07/13/24 0728 07/13/24 1125 07/13/24 1522  GLUCAP 126* 136* 123* 139* 102*     Recent Results (from the past 240 hours)  Culture, blood (routine x 2)     Status: None   Collection Time: 07/09/24  2:40 PM   Specimen: BLOOD  Result Value Ref Range Status   Specimen Description   Final    BLOOD LEFT ANTECUBITAL Performed at [facility], [address] [place], [place] [address]    Special Requests   Final    BOTTLES DRAWN AEROBIC AND ANAEROBIC Blood Culture adequate volume Performed at [facility], [address], [place], [place] [address]    Culture   Final    NO GROWTH 5 DAYS Performed at [facility], [address], [place], [place] [address]    Report Status 07/14/2024 FINAL  Final  MRSA Next Gen by PCR, Nasal     Status: Abnormal   Collection Time: 07/12/24  5:09 PM   Specimen: Nasal Mucosa; Nasal Swab  Result Value Ref Range Status   MRSA by PCR Next Gen DETECTED (A) NOT DETECTED Final    Comment: RESULT CALLED TO, READ BACK BY AND VERIFIED WITH: RN DOROTHA KU [phone] @ [phone] FH (NOTE) The GeneXpert MRSA Assay (FDA approved for NASAL specimens only), is one component of a comprehensive MRSA colonization surveillance program.  It is not intended to diagnose MRSA infection nor to guide or monitor treatment for MRSA infections. Test performance is not FDA approved in patients less than 24 years old. Performed at [facility], [address] [place], [place] [address]          Radiology Studies: No results found.       Scheduled Meds:  sodium chloride    Intravenous Once   apixaban   2.5 mg Oral BID   bisacodyl   5 mg Oral Daily   Chlorhexidine  Gluconate Cloth  6 each Topical Daily   folic acid   1 mg Oral Daily   Gerhardt's butt cream   Topical Daily   hydrocortisone  sod succinate (SOLU-CORTEF ) inj  50 mg Intravenous Q12H   Followed by   [name] ON 07/16/2024] hydrocortisone  sod  succinate (SOLU-CORTEF ) inj  50 mg Intravenous QHS   lidocaine   1 patch Transdermal Q24H   linezolid   600 mg Oral Q12H   melatonin  10 mg Oral QHS   multivitamin with minerals  1 tablet Oral Daily   mupirocin  ointment  1 Application Nasal BID   polyethylene glycol  17 g Oral Daily   senna-docusate  1 tablet Oral Daily   sodium chloride  flush  10-40 mL Intracatheter Q12H   tamsulosin   0.4 mg Oral Daily   [START ON 07/22/2024] thiamine   100 mg Oral Daily   Continuous Infusions:  ceFEPime  (MAXIPIME ) IV 2 g (07/15/24 1002)   thiamine  (VITAMIN B1) injection 500 mg (07/15/24 1207)   Followed by   [name] ON 07/17/2024] thiamine  (VITAMIN B1) injection       LOS: 5 days    Time spent: 42 min critical care time with hypotension, hypothermia.    Brigida Bureau, MD [facility]   To contact the attending provider between 7A-7P or the covering provider during after hours 7P-7A, please log into the web site [email or web address] and access using universal [place] password for that web site. If you do not have the password, please call the hospital operator.  07/15/2024, 5:41 PM    "

## 2024-07-15 NOTE — Plan of Care (Signed)
  Problem: Education: Goal: Knowledge of General Education information will improve Description: Including pain rating scale, medication(s)/side effects and non-pharmacologic comfort measures Outcome: Progressing   Problem: Health Behavior/Discharge Planning: Goal: Ability to manage health-related needs will improve Outcome: Progressing   Problem: Clinical Measurements: Goal: Ability to maintain clinical measurements within normal limits will improve Outcome: Progressing Goal: Will remain free from infection Outcome: Progressing Goal: Diagnostic test results will improve Outcome: Progressing Goal: Respiratory complications will improve Outcome: Progressing Goal: Cardiovascular complication will be avoided Outcome: Progressing   Problem: Activity: Goal: Risk for activity intolerance will decrease Outcome: Progressing   Problem: Nutrition: Goal: Adequate nutrition will be maintained Outcome: Progressing   Problem: Coping: Goal: Level of anxiety will decrease Outcome: Progressing   Problem: Elimination: Goal: Will not experience complications related to bowel motility Outcome: Progressing Goal: Will not experience complications related to urinary retention Outcome: Progressing   Problem: Pain Managment: Goal: General experience of comfort will improve and/or be controlled Outcome: Progressing   Problem: Safety: Goal: Ability to remain free from injury will improve Outcome: Progressing   Problem: Skin Integrity: Goal: Risk for impaired skin integrity will decrease Outcome: Progressing   Problem: Clinical Measurements: Goal: Ability to avoid or minimize complications of infection will improve Outcome: Progressing   Problem: Skin Integrity: Goal: Skin integrity will improve Outcome: Progressing   Problem: Education: Goal: Ability to demonstrate management of disease process will improve Outcome: Progressing Goal: Ability to verbalize understanding of medication  therapies will improve Outcome: Progressing Goal: Individualized Educational Video(s) Outcome: Progressing   Problem: Activity: Goal: Capacity to carry out activities will improve Outcome: Progressing   Problem: Cardiac: Goal: Ability to achieve and maintain adequate cardiopulmonary perfusion will improve Outcome: Progressing

## 2024-07-16 DIAGNOSIS — I5033 Acute on chronic diastolic (congestive) heart failure: Secondary | ICD-10-CM | POA: Diagnosis not present

## 2024-07-16 LAB — TYPE AND SCREEN
ABO/RH(D): AB POS
Antibody Screen: NEGATIVE
Unit division: 0

## 2024-07-16 LAB — BASIC METABOLIC PANEL WITH GFR
Anion gap: 6 (ref 5–15)
BUN: 31 mg/dL — ABNORMAL HIGH (ref 8–23)
CO2: 31 mmol/L (ref 22–32)
Calcium: 9.4 mg/dL (ref 8.9–10.3)
Chloride: 102 mmol/L (ref 98–111)
Creatinine, Ser: 0.65 mg/dL (ref 0.61–1.24)
GFR, Estimated: >60 mL/min
Glucose, Bld: 87 mg/dL (ref 70–99)
Potassium: 4.8 mmol/L (ref 3.5–5.1)
Sodium: 140 mmol/L (ref 135–145)

## 2024-07-16 LAB — CBC WITH DIFFERENTIAL/PLATELET
Abs Immature Granulocytes: 0.09 K/uL — ABNORMAL HIGH (ref 0.00–0.07)
Basophils Absolute: 0 K/uL (ref 0.0–0.1)
Basophils Relative: 0 %
Eosinophils Absolute: 0.1 K/uL (ref 0.0–0.5)
Eosinophils Relative: 2 %
HCT: 30.1 % — ABNORMAL LOW (ref 39.0–52.0)
Hemoglobin: 9.5 g/dL — ABNORMAL LOW (ref 13.0–17.0)
Immature Granulocytes: 1 %
Lymphocytes Relative: 12 %
Lymphs Abs: 0.9 K/uL (ref 0.7–4.0)
MCH: 35.4 pg — ABNORMAL HIGH (ref 26.0–34.0)
MCHC: 31.6 g/dL (ref 30.0–36.0)
MCV: 112.3 fL — ABNORMAL HIGH (ref 80.0–100.0)
Monocytes Absolute: 0.9 K/uL (ref 0.1–1.0)
Monocytes Relative: 13 %
Neutro Abs: 5 K/uL (ref 1.7–7.7)
Neutrophils Relative %: 72 %
Platelets: 62 K/uL — ABNORMAL LOW (ref 150–400)
RBC: 2.68 MIL/uL — ABNORMAL LOW (ref 4.22–5.81)
RDW: 14 % (ref 11.5–15.5)
WBC: 7 K/uL (ref 4.0–10.5)
nRBC: 0 % (ref 0.0–0.2)

## 2024-07-16 LAB — BPAM RBC
Blood Product Expiration Date: 202601102359
ISSUE DATE / TIME: 202512180923
Unit Type and Rh: 6200

## 2024-07-16 MED ORDER — SODIUM CHLORIDE 0.9 % IV SOLN
2.0000 g | Freq: Three times a day (TID) | INTRAVENOUS | Status: AC
  Administered 2024-07-16 – 2024-07-18 (×7): 2 g via INTRAVENOUS
  Filled 2024-07-16 (×7): qty 12.5

## 2024-07-16 NOTE — Progress Notes (Signed)
 Physical Therapy Treatment Patient Details Name: [name] MRN: [id number] DOB: 1950-10-18 Today's Date: 07/16/2024   History of Present Illness 73 yo M adm 07/09/24 with LB edema, SOB, orthopnea, CHF, RLE cellulitis, Rt hydronephrosis. Pt with rapid response called on 12/18 transferred to ICU Due to labored breathing. PMHx: CAD, HTN, HLD, inclusion body myositis, DVT, chonic LB edema, paralyzed Rt hemidiaphragm    PT Comments  Pt requesting to participate in bed level exercises this session due to fatigue. Pt participated in UE and LE therex (all listed below), and discussed possibility of learning new WC transfer method next session with +2 assistance from rehab team with pt verbalizing willingness to trial. Pt would benefit from further transfer training. PT will continue to treat pt while he is admitted. Patient will benefit from intensive inpatient follow-up therapy, >3 hours/day.    If plan is discharge home, recommend the following: A lot of help with walking and/or transfers;A lot of help with bathing/dressing/bathroom;Assist for transportation;Help with stairs or ramp for entrance;Assistance with cooking/housework   Can travel by private vehicle        Equipment Recommendations  None recommended by PT    Recommendations for Other Services Rehab consult     Precautions / Restrictions Precautions Precautions: Fall Recall of Precautions/Restrictions: Intact Restrictions Weight Bearing Restrictions Per Provider Order: No     Mobility  Bed Mobility Overal bed mobility: Needs Assistance             General bed mobility comments: pt declined EOB activity reporting he's too fatigued. physical assistance given for positioning with pillows under neck, and wrists for edema management    Transfers                        Ambulation/Gait                   Stairs             Wheelchair Mobility     Tilt Bed    Modified Rankin (Stroke  Patients Only)       Balance Overall balance assessment: Needs assistance, Mild deficits observed, not formally tested                                          Communication Communication Communication: Impaired Factors Affecting Communication: Hearing impaired  Cognition Arousal: Alert Behavior During Therapy: WFL for tasks assessed/performed   PT - Cognitive impairments: Problem solving                         Following commands: Intact      Cueing Cueing Techniques: Verbal cues  Exercises General Exercises - Upper Extremity Elbow Flexion: AROM, Left, 5 reps Elbow Extension: AROM, Left, 5 reps Wrist Flexion: AROM, Left, 5 reps, PROM, Right, 10 reps Wrist Extension: AROM, Left, 5 reps, PROM, Right, 10 reps General Exercises - Lower Extremity Ankle Circles/Pumps: AROM, Both, 5 reps, Supine Short Arc Quad: Strengthening, Both, 20 reps, Supine Hip ABduction/ADduction: Strengthening, Both, 10 reps, Supine    General Comments General comments (skin integrity, edema, etc.): on 3L with SpO2 94%      Pertinent Vitals/Pain Pain Assessment Pain Assessment: No/denies pain    Home Living  Prior Function            PT Goals (current goals can now be found in the care plan section) Acute Rehab PT Goals Patient Stated Goal: to get stronger and learn a new WC transfer method PT Goal Formulation: With patient Time For Goal Achievement: 07/25/24 Potential to Achieve Goals: Fair Progress towards PT goals: Progressing toward goals    Frequency    Min 2X/week      PT Plan      Co-evaluation              AM-PAC PT 6 Clicks Mobility   Outcome Measure  Help needed turning from your back to your side while in a flat bed without using bedrails?: A Little Help needed moving from lying on your back to sitting on the side of a flat bed without using bedrails?: A Lot Help needed moving to and from a bed  to a chair (including a wheelchair)?: A Lot Help needed standing up from a chair using your arms (e.g., wheelchair or bedside chair)?: Total Help needed to walk in hospital room?: Total Help needed climbing 3-5 steps with a railing? : Total 6 Click Score: 10    End of Session Equipment Utilized During Treatment: Oxygen Activity Tolerance: Patient limited by fatigue Patient left: in bed;with call bell/phone within reach;with bed alarm set Nurse Communication: Mobility status PT Visit Diagnosis: Unsteadiness on feet (R26.81);Other abnormalities of gait and mobility (R26.89);Muscle weakness (generalized) (M62.81)     Time: 8855-8790 PT Time Calculation (min) (ACUTE ONLY): 25 min  Charges:    $Therapeutic Exercise: 23-37 mins PT General Charges $$ ACUTE PT VISIT: 1 Visit                     Leontine Hilt DPT Acute Rehab Services [phone] Prefer contact via chat    Leontine NOVAK Mashell Sieben 07/16/2024, 1:42 PM

## 2024-07-16 NOTE — Progress Notes (Signed)
 " PROGRESS NOTE    [name]  FMW:[id number] DOB: May 27, 1951 DOA: 07/09/2024 PCP: Candise Aleene DEL, MD  Chief Complaint  Patient presents with   Leg Swelling    Brief Narrative:   [name] is a 73 y.o. 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    Patient very drowsy during virtual admission encounter and also hard of hearing therefore his daughter at bedside assisted with providing history.  Patient had been seen by cardiology on Thursday (12/11) due to his worsening leg swelling.  His oral diuretic regimen was increased but apparently without improvement, swelling continue to worsen and patient began complaining of more shortness of breath, dyspnea on exertion and orthopnea.   He's been admitted with a heart failure exacerbation and concern for right lower extremity cellulitis.   Will restart Lasix  at 20 mg PO daily.  He is being considered for possible transition to CIR.   Significant Events 07/12/2024 - Moved to ICU for septic shock needing levophed . 12/19 shock state drastically improved, pressors weaned off this a.m. TRH reassumed care on 12/20   12/21 Patient is resting comfortably. No new complaints.  Assessment & Plan:   Principal Problem:   Acute on chronic heart failure with preserved ejection fraction (HFpEF) (HCC) Active Problems:   Cellulitis of right lower extremity   AKI (acute kidney injury)   Coronary artery disease   HTN (hypertension)   Bilateral lower extremity edema   Elevated CK   Myositis   Hyperlipidemia   Weakness   Chronic radicular lumbar pain   History of DVT (deep vein thrombosis)   Septic shock (HCC)  Septic Shock due to Cellulitis   Chronic LE Wounds Developed hypothermia, hypotension while being treated with ancef  on 12/18 - now s/p transfer to ICU for pressors. Pt came out to floor on 07/14/2024.  TSH, free T4 wnl Cortisol did not suggest adrenal insufficiency Wean stress dose steroids as tolerated Blood culture 12/15 ngtd. PCR for MRSA positive. Currently on vanc, cefepime  - will narrow as tolerated, failed treatment with first generation cephalosporin -  Note he's been on levaquin  chronically in the past, with previous wound cultures with pseudomonas (though difficult to tell when the most recent culture was).  Given failure on ancef , may need to continue therapy covering both MRSA and pseudomonas.   CT chest with R middle lobe and R lower lobe atelectasis, mild patchy peripheral reticulation in R greater than L lungs CT renal stone study without acute findings CT tib fib pending - no crepitus or fluctuance on exam today, but significant induration Appreciate wound care recs  Acute Metabolic Encephalopathy Seems to be more alert today. Oriented x 2. TSH wnl, b12 wnl,  Was on CIWA for his drinking hx TSH wnl, b12 and folate wnl  Acute on chronic heart failure with preserved ejection fraction (HFpEF) (HCC) Patient presenting with progressively worsening swelling, dyspnea worse on exertion, orthopnea with progressive symptoms despite increase in oral diuretics just 4 days ago as outpatient with cardiology.   Last echo in November 2024 was poor acoustic windows and limited but showed normal EF 55 to 60%, no LV regional WMA's, mild LVH, mild MR. Will restart lasix  at 20 mg daily. Monitor electrolytes and renal status.  Appreciate cardiology consult  Follow repeat echo  - with preserved EF, normal RVSF Strict I/O, daily weights   Chronic Shortness of Breath Paralyzed R Hemidiaphragm  Respiratory Acidosis  Will continue bipap at night  CT chest with prominent chronic elevation of R hemidiaphragm, complete R middle  lobe and near complete R lower lobe atelectasis - chronic mild patchy peripheral reticulation in the R greater than L lungs  AKI (acute kidney injury) Moderate Right Hydronephrosis Echogenic Lesion in Anterior Bladder Creatinine on admission 1.89, up from 0.92 in mid October.   Creatinine at or below baseline on 07/16/2024. Creatinine is 0.65. Note question echogenic lesion in anterior bladder on renal US , will need CT abd/pelvis with delayed bladder imaging at some time (when more stable)  Elevated CK Etiology unclear, CK elevated at 1802 on admission.  No reported trauma history or prolonged downtime, but patient is quite sedentary at baseline.  He had a normal CK level 1 year ago at 182.  Does have baseline inclusion body myositis. Holding statin for now Will continue to trend --mildly elevated AST - trend --Check urine myoglobin normal   BPH Urinary Retention Flomax  Now with indwelling catheter, consider TOV in a few days  HTN (hypertension) Blood pressures nor low-normotensive. Monitor.  Holding home lisinopril  with AKI above atenolol  on hold   Coronary artery disease Elevated troponin -mild, and trended down, 169>> 15.  Patient has no complaints of chest pain and EKG showed no acute ischemic changes. Atenolol , crestor  on hold Echocardiogram with preserved EF, unable to evaluate regional wall motion   History of DVT (deep vein thrombosis) On Eliquis  2.5 mg twice daily and daughter reports good compliance with this. Right lower extremity venous Doppler ultrasound was negative for DVT in the ED. 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 PT/OT     DVT prophylaxis: eliquis  Code Status: full Family Communication: wife 12/20  Disposition:   Status is: Observation The patient remains OBS appropriate and will d/c before 2 midnights.   Consultants:  cardiology  Procedures:  none  Antimicrobials:  Anti-infectives (From admission, onward)    Start     Dose/Rate Route Frequency Ordered Stop   07/16/24 1800  ceFEPIme  (MAXIPIME ) 2 g in sodium chloride  0.9 % 100 mL IVPB        2 g 200 mL/hr over 30 Minutes Intravenous Every 8 hours 07/16/24 0921     07/14/24 1400  linezolid  (ZYVOX ) tablet 600 mg        600 mg Oral Every 12 hours 07/14/24 1313     07/13/24 1800  vancomycin  (VANCOCIN ) IVPB 1000 mg/200 mL premix  Status:  Discontinued        1,000 mg 200 mL/hr over 60 Minutes Intravenous Every 24 hours 07/12/24 1608 07/13/24 1019   07/13/24 1400  vancomycin  (VANCOREADY) IVPB 1750 mg/350 mL  Status:  Discontinued  1,750 mg 175 mL/hr over 120 Minutes Intravenous Every 24 hours 07/13/24 1019 07/14/24 1312   07/12/24 2300  cefTRIAXone (ROCEPHIN) 1 g in sodium chloride  0.9 % 100 mL IVPB  Status:  Discontinued        1 g 200 mL/hr over 30 Minutes Intravenous Every 24 hours 07/12/24 1546 07/12/24 1602   07/12/24 1800  ceFEPIme  (MAXIPIME ) 2 g in sodium chloride  0.9 % 100 mL IVPB  Status:  Discontinued        2 g 200 mL/hr over 30 Minutes Intravenous 2 times daily 07/12/24 1614 07/12/24 1648   07/12/24 1700  vancomycin  (VANCOREADY) IVPB 2000 mg/400 mL  Status:  Discontinued        2,000 mg 200 mL/hr over 120 Minutes Intravenous  Once 07/12/24 1608 07/12/24 1648   07/12/24 1700  vancomycin  (VANCOREADY)  IVPB 2000 mg/400 mL        2,000 mg 200 mL/hr over 120 Minutes Intravenous STAT 07/12/24 1648 07/12/24 2001   07/12/24 1700  ceFEPIme  (MAXIPIME ) 2 g in sodium chloride  0.9 % 100 mL IVPB  Status:  Discontinued        2 g 200 mL/hr over 30 Minutes Intravenous 2 times daily 07/12/24 1648 07/16/24 0921   07/09/24 1745  ceFAZolin  (ANCEF ) IVPB 2g/100 mL premix  Status:  Discontinued        2 g 200 mL/hr over 30 Minutes Intravenous Every 8 hours 07/09/24 1734 07/12/24 1546   07/09/24 1500  vancomycin  (VANCOCIN ) IVPB 1000 mg/200 mL premix       Placed in Followed by Linked Group   1,000 mg 200 mL/hr over 60 Minutes Intravenous  Once 07/09/24 1406 07/09/24 1808   07/09/24 1415  vancomycin  (VANCOCIN ) IVPB 1000 mg/200 mL premix  Status:  Discontinued        1,000 mg 200 mL/hr over 60 Minutes Intravenous  Once 07/09/24 1404 07/09/24 1406   07/09/24 1415  ceFEPIme  (MAXIPIME ) 2 g in sodium chloride  0.9 % 100 mL IVPB        2 g 200 mL/hr over 30 Minutes Intravenous  Once 07/09/24 1404 07/09/24 1525   07/09/24 1415  vancomycin  (VANCOCIN ) IVPB 1000 mg/200 mL premix       Placed in Followed by Linked Group   1,000 mg 200 mL/hr over 60 Minutes Intravenous  Once 07/09/24 1406 07/09/24 1658       Subjective: Daughter Veronda at bedside He has no specific complaints - no pain, notes some SOB  Objective: Vitals:   07/15/24 2356 07/16/24 0422 07/16/24 0700 07/16/24 1100  BP: 120/70 125/65 119/81 (!) 115/44  Pulse:      Resp: 20 20 19 20   Temp: (!) 97.5 F (36.4 C) 97.6 F (36.4 C) (!) 97.3 F (36.3 C) 97.6 F (36.4 C)  TempSrc: Axillary Oral Oral Oral  SpO2:      Weight:  (!) 138.7 kg    Height:        Intake/Output Summary (Last 24 hours) at 07/16/2024 1701 Last data filed at 07/16/2024 0423 Gross per 24 hour  Intake --  Output 750 ml  Net -750 ml   Filed Weights   07/14/24 0551 07/15/24 0529 07/16/24 0422  Weight: (!) 139.7 kg (!) 139 kg (!) 138.7 kg     Examination:  Exam:  Constitutional:  The patient is awake, alert, and oriented x 2. No acute distress. Respiratory:  No increased work of breathing. No wheezes, rales, or rhonchi No tactile fremitus Cardiovascular:  Regular rate  and rhythm No murmurs, ectopy, or gallups. No lateral PMI. No thrills. Abdomen:  Abdomen is soft, non-tender, non-distended No hernias, masses, or organomegaly Normoactive bowel sounds.  Musculoskeletal:  No cyanosis, clubbing, or edema Skin:  No rashes, lesions, ulcers palpation of skin: no induration or nodules Decreased erythema and warmth. Still tender right lower extremity. Neurologic:  CN 2-12 intact Sensation all 4 extremities intact Psychiatric:  Mental status Mood, affect appropriate Orientation to person, place, time  judgment and insight appear intact    Data Reviewed: I have personally reviewed following labs and imaging studies  CBC: Recent Labs  Lab 07/11/24 0504 07/12/24 0421 07/12/24 1332 07/13/24 0906 07/13/24 1137 07/14/24 0851 07/15/24 0259 07/16/24 0530  WBC 12.1* 8.6  --  8.4  --  7.6 7.0 7.0  NEUTROABS 9.7* 6.5  --  7.5  --   --  5.5 5.0  HGB 10.9* 9.8*   < > 9.9* 9.6* 10.0* 9.6* 9.5*  HCT 33.5* 29.9*   < > 30.6* 29.7* 31.3* 30.3* 30.1*  MCV 108.1* 109.1*  --  109.3*  --  110.6* 111.4* 112.3*  PLT 88* 74*  --  67*  --  64* 64* 62*   < > = values in this interval not displayed.    Basic Metabolic Panel: Recent Labs  Lab 07/11/24 0504 07/12/24 0421 07/12/24 1733 07/13/24 0906 07/14/24 0851 07/15/24 0259 07/16/24 0530  NA 140 137 135 139 143 141 140  K 4.7 4.0 4.2 4.5 4.2 4.9 4.8  CL 99 98  --  101 103 104 102  CO2 30 30  --  29 35* 34* 31  GLUCOSE 105* 112*  --  119* 102* 115* 87  BUN 47* 60*  --  53* 42* 36* 31*  CREATININE 1.46* 1.85*  --  1.21 0.84 0.74 0.65  CALCIUM  9.2 8.7*  --  9.1 9.2 9.5 9.4  MG 2.2 2.2  --  2.3  --  2.4  --   PHOS 3.3 4.7*  --  3.4  --  2.4*  --      GFR: Estimated Creatinine Clearance (by C-G formula based on SCr of 0.65 mg/dL) Male: 07.2 mL/min Male: 112.1 mL/min  Liver Function Tests: Recent Labs  Lab 07/11/24 0504 07/12/24 0421 07/13/24 0906 07/14/24 0851 07/15/24 0259  AST 115* 101* 67* 48* 40  ALT 21 8 5 6 6   ALKPHOS 58 55 55 54 50  BILITOT 0.6 0.4 0.6 0.7 0.6  PROT 5.8* 5.0* 5.3* 5.5* 5.3*  ALBUMIN 3.3* 3.0* 3.2* 3.3* 3.2*    CBG: Recent Labs  Lab 07/13/24 0317 07/13/24 0728 07/13/24 1125 07/13/24 1522 07/15/24 2114  GLUCAP 136* 123* 139* 102* 127*     Recent Results (from the past 240 hours)  Culture, blood (routine x 2)     Status: None   Collection Time: 07/09/24  2:40 PM   Specimen: BLOOD  Result Value Ref Range Status   Specimen Description   Final    BLOOD LEFT ANTECUBITAL Performed at [facility], [address], [place], [place] [address]    Special Requests   Final    BOTTLES DRAWN AEROBIC AND ANAEROBIC Blood Culture adequate volume Performed at [facility], [address], [place], [place] [address]    Culture   Final    NO GROWTH 5 DAYS Performed at [facility], [address] [place], [place] [address]    Report Status 07/14/2024 FINAL  Final  MRSA Next Gen  by PCR, Nasal     Status: Abnormal   Collection Time: 07/12/24  5:09 PM   Specimen: Nasal Mucosa; Nasal Swab  Result Value Ref Range Status   MRSA by PCR Next Gen DETECTED (A) NOT DETECTED Final    Comment: RESULT CALLED TO, READ BACK BY AND VERIFIED WITH: RN DOROTHA KU [phone] @ [phone] FH (NOTE) The GeneXpert MRSA Assay (FDA approved for NASAL specimens only), is one component of a comprehensive MRSA colonization surveillance program. It is not intended to diagnose MRSA infection nor to guide or monitor treatment for MRSA infections. Test performance is not FDA approved in patients less than 44 years old. Performed at [facility], [address] [place],  [place] [address]          Radiology Studies: No results found.       Scheduled Meds:  sodium chloride    Intravenous Once   apixaban   2.5 mg Oral BID   bisacodyl   5 mg Oral Daily   Chlorhexidine  Gluconate Cloth  6 each Topical Daily   folic acid   1 mg Oral Daily   Gerhardt's butt cream   Topical Daily   hydrocortisone  sod succinate (SOLU-CORTEF ) inj  50 mg Intravenous QHS   lidocaine   1 patch Transdermal Q24H   linezolid   600 mg Oral Q12H   melatonin  10 mg Oral QHS   multivitamin with minerals  1 tablet Oral Daily   mupirocin  ointment  1 Application Nasal BID   polyethylene glycol  17 g Oral Daily   senna-docusate  1 tablet Oral Daily   sodium chloride  flush  10-40 mL Intracatheter Q12H   tamsulosin   0.4 mg Oral Daily   [START ON 07/22/2024] thiamine   100 mg Oral Daily   Continuous Infusions:  ceFEPime  (MAXIPIME ) IV     thiamine  (VITAMIN B1) injection 500 mg (07/16/24 1203)   Followed by   [name] ON 07/17/2024] thiamine  (VITAMIN B1) injection       LOS: 6 days    Time spent: 38 min critical care time with hypotension, hypothermia.    Brigida Bureau, MD [facility]   To contact the attending provider between 7A-7P or the covering provider during after hours 7P-7A, please log into the web site [email or web address] and access using universal [place] password for that web site. If you do not have the password, please call the hospital operator.  07/16/2024, 5:01 PM    "

## 2024-07-16 NOTE — Progress Notes (Signed)
 Heart Failure Navigator Progress Note  Assessed for Heart & Vascular TOC clinic readiness.  Patient does not meet criteria due to EF 60-65%, has a scheduled [facility] appointment on 08/10/2024. No HF TOC. .   Navigator will sign off at this time.   Stephane Haddock, BSN, Scientist, Clinical (histocompatibility And Immunogenetics) Only

## 2024-07-16 NOTE — Progress Notes (Signed)
 Inpatient Rehab Admissions Coordinator:  Continue to monitor pt's progress and participation with therapies to help determine tolerance for the intensity of CIR. Primary AC will follow.   Tinnie Yvone Cohens, MS, CCC-SLP Admissions Coordinator [phone]

## 2024-07-16 NOTE — Care Management Important Message (Signed)
 Important Message  Patient Details  Name: [name] MRN: [id number] Date of Birth: Sep 18, 1950   Important Message Given:  Yes - Medicare IM     [name] 07/16/2024, 12:14 PM

## 2024-07-16 NOTE — Plan of Care (Signed)

## 2024-07-17 ENCOUNTER — Inpatient Hospital Stay (HOSPITAL_COMMUNITY)

## 2024-07-17 DIAGNOSIS — I5033 Acute on chronic diastolic (congestive) heart failure: Secondary | ICD-10-CM | POA: Diagnosis not present

## 2024-07-17 LAB — CBC WITH DIFFERENTIAL/PLATELET
Abs Immature Granulocytes: 0.09 K/uL — ABNORMAL HIGH (ref 0.00–0.07)
Basophils Absolute: 0 K/uL (ref 0.0–0.1)
Basophils Relative: 0 %
Eosinophils Absolute: 0.3 K/uL (ref 0.0–0.5)
Eosinophils Relative: 3 %
HCT: 32.2 % — ABNORMAL LOW (ref 39.0–52.0)
Hemoglobin: 10 g/dL — ABNORMAL LOW (ref 13.0–17.0)
Immature Granulocytes: 1 %
Lymphocytes Relative: 11 %
Lymphs Abs: 0.8 K/uL (ref 0.7–4.0)
MCH: 35.5 pg — ABNORMAL HIGH (ref 26.0–34.0)
MCHC: 31.1 g/dL (ref 30.0–36.0)
MCV: 114.2 fL — ABNORMAL HIGH (ref 80.0–100.0)
Monocytes Absolute: 1.1 K/uL — ABNORMAL HIGH (ref 0.1–1.0)
Monocytes Relative: 14 %
Neutro Abs: 5.4 K/uL (ref 1.7–7.7)
Neutrophils Relative %: 71 %
Platelets: 64 K/uL — ABNORMAL LOW (ref 150–400)
RBC: 2.82 MIL/uL — ABNORMAL LOW (ref 4.22–5.81)
RDW: 14 % (ref 11.5–15.5)
WBC: 7.6 K/uL (ref 4.0–10.5)
nRBC: 0 % (ref 0.0–0.2)

## 2024-07-17 LAB — BASIC METABOLIC PANEL WITH GFR
Anion gap: 4 — ABNORMAL LOW (ref 5–15)
BUN: 26 mg/dL — ABNORMAL HIGH (ref 8–23)
CO2: 33 mmol/L — ABNORMAL HIGH (ref 22–32)
Calcium: 9.6 mg/dL (ref 8.9–10.3)
Chloride: 103 mmol/L (ref 98–111)
Creatinine, Ser: 0.69 mg/dL (ref 0.61–1.24)
GFR, Estimated: >60 mL/min
Glucose, Bld: 89 mg/dL (ref 70–99)
Potassium: 5.2 mmol/L — ABNORMAL HIGH (ref 3.5–5.1)
Sodium: 140 mmol/L (ref 135–145)

## 2024-07-17 LAB — CREATININE, SERUM
Creatinine, Ser: 0.69 mg/dL (ref 0.61–1.24)
GFR, Estimated: >60 mL/min

## 2024-07-17 MED ORDER — SODIUM ZIRCONIUM CYCLOSILICATE 5 G PO PACK
5.0000 g | PACK | Freq: Once | ORAL | Status: AC
  Administered 2024-07-17: 5 g via ORAL
  Filled 2024-07-17: qty 1

## 2024-07-17 MED ORDER — ATENOLOL 25 MG PO TABS
25.0000 mg | ORAL_TABLET | Freq: Every day | ORAL | Status: DC
  Administered 2024-07-17 – 2024-07-20 (×4): 25 mg via ORAL
  Filled 2024-07-17 (×4): qty 1

## 2024-07-17 MED ORDER — INFLUENZA VAC SPLIT HIGH-DOSE 0.5 ML IM SUSY
0.5000 mL | PREFILLED_SYRINGE | INTRAMUSCULAR | Status: AC
  Administered 2024-07-20: 0.5 mL via INTRAMUSCULAR
  Filled 2024-07-17: qty 0.5

## 2024-07-17 MED ORDER — FUROSEMIDE 10 MG/ML IJ SOLN
40.0000 mg | Freq: Once | INTRAMUSCULAR | Status: AC
  Administered 2024-07-17: 40 mg via INTRAVENOUS
  Filled 2024-07-17: qty 4

## 2024-07-17 MED ORDER — CHLORHEXIDINE GLUCONATE CLOTH 2 % EX PADS
6.0000 | MEDICATED_PAD | Freq: Every day | CUTANEOUS | Status: DC
  Administered 2024-07-18 – 2024-07-20 (×3): 6 via TOPICAL

## 2024-07-17 NOTE — Progress Notes (Signed)
 Placed patient on bipap for the night

## 2024-07-17 NOTE — TOC Progression Note (Addendum)
 Transition of Care [facility]) - Progression Note    Patient Details  Name: [name] MRN: [id number] Date of Birth: [birth year]  Transition [facility]) CM/SW Contact  Graves-Bigelow, Erminio Deems, RN Phone Number: 07/17/2024, 11:02 AM  Clinical Narrative: Patient was discussed in progression rounds. Hospital course has been complicated by hypotension and shock-moved to ICU then weaned off pressors. CIR continues to follow the patient to see if he will be able to tolerate and participate in therapies. Patient may benefit from SNF as a backup disposition plan as he progresses. ICM will continue to follow for disposition needs.   Expected Discharge Plan: Home w Home Health Services Barriers to Discharge: Continued Medical Work up  Expected Discharge Plan and Services In-house Referral: NA Discharge Planning Services: CM Consult Post Acute Care Choice: Home Health Living arrangements for the past 2 months: Single Family Home                   DME Agency: NA  Social Drivers of Health (SDOH) Interventions SDOH Screenings   Food Insecurity: No Food Insecurity (07/10/2024)  Housing: Low Risk (07/10/2024)  Transportation Needs: No Transportation Needs (07/10/2024)  Utilities: Not At Risk (07/10/2024)  Alcohol Screen: Low Risk (06/05/2024)  Depression (PHQ2-9): Low Risk (12/28/2023)  Financial Resource Strain: Low Risk (06/05/2024)  Physical Activity: Inactive (06/05/2024)  Social Connections: Socially Isolated (07/10/2024)  Stress: No Stress Concern Present (06/05/2024)  Tobacco Use: Medium Risk (07/09/2024)  Health Literacy: Adequate Health Literacy (12/28/2023)   Readmission Risk Interventions     No data to display

## 2024-07-17 NOTE — TOC Progression Note (Signed)
 Transition of Care [facility]) - Progression Note    Patient Details  Name: [name] MRN: [id number] Date of Birth: 1951-05-23  Transition of Care [facility]) CM/SW Contact  Luann SHAUNNA [place], [place] Phone Number: 07/17/2024, 2:14 PM  Clinical Narrative:     Pt not at level to pursue CIR admission. CSW contacted pt wife and discussed dispo. They are agreeable to SNF w/u. CSW explained medicare coverage and workup process. Fl2 completed and bed requests sent in hub.   Expected Discharge Plan: Skilled Nursing Facility Barriers to Discharge: SNF Pending bed offer               Expected Discharge Plan and Services In-house Referral: NA Discharge Planning Services: CM Consult Post Acute Care Choice: Home Health Living arrangements for the past 2 months: Skilled Nursing Facility                   DME Agency: NA                   Social Drivers of Health (SDOH) Interventions SDOH Screenings   Food Insecurity: No Food Insecurity (07/10/2024)  Housing: Low Risk (07/10/2024)  Transportation Needs: No Transportation Needs (07/10/2024)  Utilities: Not At Risk (07/10/2024)  Alcohol Screen: Low Risk (06/05/2024)  Depression (PHQ2-9): Low Risk (12/28/2023)  Financial Resource Strain: Low Risk (06/05/2024)  Physical Activity: Inactive (06/05/2024)  Social Connections: Socially Isolated (07/10/2024)  Stress: No Stress Concern Present (06/05/2024)  Tobacco Use: Medium Risk (07/09/2024)  Health Literacy: Adequate Health Literacy (12/28/2023)    Readmission Risk Interventions     No data to display

## 2024-07-17 NOTE — Consult Note (Addendum)
 WOC Nurse re-consult Note: Refer to previous WOC consult notes on 12/16.   At that time, consult was only requested for bilat leg wounds.  Today, consult is requested for bilat buttocks.  Performed remotely after review of progress notes and photos in the EMR.   A photo taken of the bilat buttocks on 12/16 indicates a dark red-purple Deep tissue pressure injury (DTPI)  in a round linear outline which was present on admission. DTPI are high risk to evolve into full thickness tissue loss within 7-10 days.  This is occurring, according to todays' photo there are red moist patchy areas of full thickness skin loss with darker colored skin surrounding the bilat buttocks.   Topical treatment orders provided for bedside nurses to perform as follows: Apply Xeroform gauze to bilat buttocks Q day, then cover with foam dressing.  Change foam dressing Q 3 days or PRN soiling. Please re-consult if further assistance is needed.  Thank-you,  Stephane Fought MSN, RN, CWOCN, CWCN-AP, CNS Contact Mon-Fri 0700-1500: [phone]

## 2024-07-17 NOTE — Progress Notes (Signed)
 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   - Admitted with CHF exacerbation, wounds - Hospital course complicated by hypotension and shock 07/12/2024 - Moved to ICU for septic shock needing levophed . 12/19 BP improved, pressors weaned off  Transferred from P[facility] to [facility] on 12/20   Subjective: Some shortness of breath last night, did not use BiPAP last night Dr. Eden notes reviewed  Assessment & Plan:  Septic Shock due to Cellulitis  Chronic LE Wounds Developed hypothermia, hypotension while being treated with ancef  on 12/18 - now s/p transfer to ICU for pressors. Pt came out to floor on 07/14/2024.  TSH, free T4 wnl Cortisol did not suggest adrenal insufficiency, now off stress dose steroids Blood culture 12/15 ngtd. PCR for MRSA positive. -Currently on cefepime  and linezolid , prior history of wound cultures with Pseudomonas -De-escalate antibiotics soon, day 5/7 so far of current ABX -CT with no abscess, skin thickening/cellulitis changes -Continue wound care - Discharge planning, TOC following, CIR versus SNF  Acute Metabolic Encephalopathy -Likely encephalopathy in the setting of sepsis and shock, now improving, this morning he is awake alert oriented to self place and partly to time TSH wnl, b12 and folate wnl - Increase activity, PT OT eval  Acute on chronic heart failure with preserved ejection fraction (HFpEF) (HCC) Patient presenting with progressively  worsening swelling, dyspnea worse on exertion, orthopnea with progressive symptoms despite increase in oral diuretics just 4 days ago as outpatient with cardiology.   Last echo in November 2024 was poor acoustic windows and limited but showed normal EF 55 to 60%, no LV regional WMA's, mild LVH, mild MR. Cards was following, now signed off Follow repeat echo  - with preserved EF, normal RVSF Repeat Lasix  IV X1 today, resume oral diuretics from tomorrow -Restarted atenolol    Chronic anemia, multifactorial Paralyzed R Hemidiaphragm  OSA OHS Will continue bipap at night, noncompliant last night, encouraged again CT chest with prominent chronic elevation of R hemidiaphragm, complete R middle lobe and near complete R lower lobe atelectasis - chronic mild patchy peripheral reticulation in the R greater than L lungs  AKI (acute kidney injury) Echogenic Lesion in Anterior Bladder Creatinine on admission 1.89, up from 0.92 in mid October.   Creatinine at or below baseline on 07/16/2024. Creatinine is 0.65. Now resolved  Elevated CK Etiology unclear, CK elevated at 1802 on admission.  No reported trauma history or prolonged downtime,  He had a normal CK level 1 year ago at 182.  Does have baseline inclusion body myositis. Holding statin for now Will continue to trend   BPH Urinary Retention Flomax  Now with indwelling catheter, consider TOV in a few days when more ambulatory   Coronary artery disease Elevated troponin -mild, and trended down, 169>> 15.  Patient has no complaints of chest pain and EKG showed no acute ischemic changes. Atenolol , crestor  on hold Echocardiogram with preserved EF, unable to evaluate regional wall motion   History of DVT (deep vein thrombosis) On Eliquis  2.5 mg twice daily and daughter reports  good compliance with this. Right lower extremity venous Doppler ultrasound was negative for DVT in the ED. 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 PT/OT     DVT prophylaxis: eliquis  Code Status: full Family Communication: Discussed with patient detail, no family at bedside Disposition: CIR versus SNF   Consultants:  cardiology  Procedures:  none  Antimicrobials:  Anti-infectives (From admission, onward)    Start     Dose/Rate Route Frequency Ordered Stop   07/16/24 1800  ceFEPIme  (MAXIPIME ) 2 g in sodium chloride  0.9 % 100 mL IVPB        2 g 200 mL/hr over 30 Minutes Intravenous Every 8 hours 07/16/24 0921     07/14/24 1400  linezolid  (ZYVOX ) tablet 600 mg        600 mg Oral Every 12 hours 07/14/24 1313     07/13/24 1800  vancomycin  (VANCOCIN ) IVPB 1000 mg/200 mL premix  Status:  Discontinued        1,000 mg 200 mL/hr over 60 Minutes Intravenous Every 24 hours 07/12/24 1608 07/13/24 1019   07/13/24 1400  vancomycin  (VANCOREADY) IVPB 1750 mg/350 mL  Status:  Discontinued        1,750 mg 175 mL/hr over 120 Minutes Intravenous Every 24 hours 07/13/24 1019 07/14/24 1312   07/12/24 2300  cefTRIAXone (ROCEPHIN) 1 g in sodium chloride  0.9 % 100 mL IVPB  Status:  Discontinued        1 g 200 mL/hr over 30 Minutes Intravenous Every 24 hours 07/12/24 1546 07/12/24 1602   07/12/24 1800  ceFEPIme  (MAXIPIME ) 2 g in sodium chloride  0.9 % 100 mL IVPB  Status:  Discontinued        2 g 200 mL/hr over 30 Minutes  Intravenous 2 times daily 07/12/24 1614 07/12/24 1648   07/12/24 1700  vancomycin  (VANCOREADY) IVPB 2000 mg/400 mL  Status:  Discontinued        2,000 mg 200 mL/hr over 120 Minutes Intravenous  Once 07/12/24 1608 07/12/24 1648   07/12/24 1700  vancomycin  (VANCOREADY) IVPB 2000 mg/400 mL        2,000 mg 200 mL/hr over 120 Minutes Intravenous STAT 07/12/24 1648 07/12/24 2001   07/12/24 1700  ceFEPIme  (MAXIPIME ) 2 g in sodium chloride  0.9 % 100 mL IVPB  Status:  Discontinued        2 g 200 mL/hr over 30 Minutes Intravenous 2 times daily 07/12/24 1648 07/16/24 0921   07/09/24 1745  ceFAZolin  (ANCEF ) IVPB 2g/100 mL premix  Status:  Discontinued        2 g 200 mL/hr over 30 Minutes Intravenous Every 8 hours 07/09/24 1734 07/12/24 1546   07/09/24 1500  vancomycin  (VANCOCIN ) IVPB 1000 mg/200 mL premix       Placed in Followed by Linked Group   1,000 mg 200 mL/hr over 60 Minutes Intravenous  Once 07/09/24 1406 07/09/24 1808  07/09/24 1415  vancomycin  (VANCOCIN ) IVPB 1000 mg/200 mL premix  Status:  Discontinued        1,000 mg 200 mL/hr over 60 Minutes Intravenous  Once 07/09/24 1404 07/09/24 1406   07/09/24 1415  ceFEPIme  (MAXIPIME ) 2 g in sodium chloride  0.9 % 100 mL IVPB        2 g 200 mL/hr over 30 Minutes Intravenous  Once 07/09/24 1404 07/09/24 1525   07/09/24 1415  vancomycin  (VANCOCIN ) IVPB 1000 mg/200 mL premix       Placed in Followed by Linked Group   1,000 mg 200 mL/hr over 60 Minutes Intravenous  Once 07/09/24 1406 07/09/24 1658       Objective: Vitals:   07/16/24 2030 07/17/24 0052 07/17/24 0434 07/17/24 0814  BP: (!) 123/42 134/60 (!) 167/82 110/84  Pulse: (!) 104 99 (!) 107 (!) 108  Resp: 19 20 20 20   Temp: 97.8 F (36.6 C) 97.9 F (36.6 C) (!) 97.5 F (36.4 C) 97.8 F (36.6 C)  TempSrc: Oral Oral Oral Oral  SpO2: 94% 98% 97% 100%  Weight:    (!) 139.9 kg  Height:        Intake/Output Summary (Last 24 hours) at 07/17/2024 1000 Last data filed at 07/17/2024  0500 Gross per 24 hour  Intake 61.93 ml  Output 1575 ml  Net -1513.07 ml   Filed Weights   07/15/24 0529 07/16/24 0422 07/17/24 0814  Weight: (!) 139 kg (!) 138.7 kg (!) 139.9 kg    Examination:  Exam: Obese chronically ill laying in bed, AO x 2, very hard of hearing HEENT: Neck obese unable to assess JVD CVS: S1-S2, regular rhythm Lungs: Poor air movement bilaterally Abdomen: Soft, obese, nontender, bowel sounds present Extremities with chronic skin changes: Left lower leg with 1+ edema and dressing      Data Reviewed: I have personally reviewed following labs and imaging studies  CBC: Recent Labs  Lab 07/12/24 0421 07/12/24 1332 07/13/24 0906 07/13/24 1137 07/14/24 0851 07/15/24 0259 07/16/24 0530 07/17/24 0458  WBC 8.6  --  8.4  --  7.6 7.0 7.0 7.6  NEUTROABS 6.5  --  7.5  --   --  5.5 5.0 5.4  HGB 9.8*   < > 9.9* 9.6* 10.0* 9.6* 9.5* 10.0*  HCT 29.9*   < > 30.6* 29.7* 31.3* 30.3* 30.1* 32.2*  MCV 109.1*  --  109.3*  --  110.6* 111.4* 112.3* 114.2*  PLT 74*  --  67*  --  64* 64* 62* 64*   < > = values in this interval not displayed.    Basic Metabolic Panel: Recent Labs  Lab 07/11/24 0504 07/12/24 0421 07/12/24 1733 07/13/24 0906 07/14/24 0851 07/15/24 0259 07/16/24 0530 07/17/24 0005 07/17/24 0458  NA 140 137   < > 139 143 141 140  --  140  K 4.7 4.0   < > 4.5 4.2 4.9 4.8  --  5.2*  CL 99 98  --  101 103 104 102  --  103  CO2 30 30  --  29 35* 34* 31  --  33*  GLUCOSE 105* 112*  --  119* 102* 115* 87  --  89  BUN 47* 60*  --  53* 42* 36* 31*  --  26*  CREATININE 1.46* 1.85*  --  1.21 0.84 0.74 0.65 0.69 0.69  CALCIUM  9.2 8.7*  --  9.1 9.2 9.5 9.4  --  9.6  MG 2.2 2.2  --  2.3  --  2.4  --   --   --   PHOS 3.3 4.7*  --  3.4  --  2.4*  --   --   --    < > = values in this interval not displayed.    GFR: Estimated Creatinine Clearance (by C-G formula based on SCr of 0.69 mg/dL) Male: 06.8 mL/min Male: 112.6 mL/min  Liver Function  Tests: Recent Labs  Lab 07/11/24 0504 07/12/24 0421 07/13/24 0906 07/14/24 0851 07/15/24 0259  AST 115* 101* 67* 48* 40  ALT 21 8 5 6 6   ALKPHOS 58 55 55 54 50  BILITOT 0.6 0.4 0.6 0.7 0.6  PROT 5.8* 5.0* 5.3* 5.5* 5.3*  ALBUMIN 3.3* 3.0* 3.2* 3.3* 3.2*    CBG: Recent Labs  Lab 07/13/24 0317 07/13/24 0728 07/13/24 1125 07/13/24 1522 07/15/24 2114  GLUCAP 136* 123* 139* 102* 127*     Recent Results (from the past 240 hours)  Culture, blood (routine x 2)     Status: None   Collection Time: 07/09/24  2:40 PM   Specimen: BLOOD  Result Value Ref Range Status   Specimen Description   Final    BLOOD LEFT ANTECUBITAL Performed at [facility], [address] [place], [place] [address]    Special Requests   Final    BOTTLES DRAWN AEROBIC AND ANAEROBIC Blood Culture adequate volume Performed at [facility], [address] [place], [place] [address]    Culture   Final    NO GROWTH 5 DAYS Performed at [facility], [address] [place], [place] [address]    Report Status 07/14/2024 FINAL  Final  MRSA Next Gen by PCR, Nasal     Status: Abnormal   Collection Time: 07/12/24  5:09 PM   Specimen: Nasal Mucosa; Nasal Swab  Result Value Ref Range Status   MRSA by PCR Next Gen DETECTED (A) NOT DETECTED Final    Comment: RESULT CALLED TO, READ BACK BY AND VERIFIED WITH: RN DOROTHA KU [phone] @ [phone] (NOTE) The GeneXpert MRSA Assay (FDA approved for NASAL specimens only), is one component of a comprehensive MRSA colonization surveillance program. It is not intended to diagnose MRSA infection nor to guide or monitor treatment for MRSA infections. Test performance is not FDA approved in patients less than 94 years old. Performed at [facility], [address] [place], [place] [address]          Radiology Studies: DG CHEST PORT 1 VIEW Result Date: 07/17/2024 EXAM: 1 VIEW(S) XRAY OF THE CHEST 07/17/2024  08:26:51 AM COMPARISON: CT chest 07/12/2024 and earlier. CLINICAL HISTORY: 73 year old male with hypoxia. FINDINGS: LINES, TUBES AND DEVICES: Cervical spine hardware partially included in field of view. LUNGS AND PLEURA: Continued low lung volume superimposed on chronic elevation of the right hemidiaphragm. Plate-like right lung base atelectasis also regressed. Left lung base ventilation appears improved. No areas of worsening ventilation. No pneumothorax. HEART AND MEDIASTINUM: Stable cardiomegaly. BONES AND SOFT TISSUES: No acute osseous abnormality. IMPRESSION: 1. Low lung volumes with chronic elevation of the right hemidiaphragm. Improving basilar atelectasis, no worsening ventilation. Electronically signed by: Helayne Hurst MD 07/17/2024 09:15 AM EST RP Workstation: [id number]         Scheduled Meds:  sodium chloride    Intravenous Once   apixaban   2.5 mg Oral BID   bisacodyl   5 mg Oral Daily   Chlorhexidine  Gluconate Cloth  6 each Topical Daily   folic acid   1 mg Oral  Daily   furosemide   40 mg Intravenous Once   Gerhardt's butt cream   Topical Daily   [START ON 07/18/2024] Influenza vac split trivalent PF  0.5 mL Intramuscular Tomorrow-1000   lidocaine   1 patch Transdermal Q24H   linezolid   600 mg Oral Q12H   melatonin  10 mg Oral QHS   multivitamin with minerals  1 tablet Oral Daily   mupirocin  ointment  1 Application Nasal BID   polyethylene glycol  17 g Oral Daily   senna-docusate  1 tablet Oral Daily   sodium chloride  flush  10-40 mL Intracatheter Q12H   tamsulosin   0.4 mg Oral Daily   [START ON 07/22/2024] thiamine   100 mg Oral Daily   Continuous Infusions:  ceFEPime  (MAXIPIME ) IV 2 g (07/17/24 0254)   thiamine  (VITAMIN B1) injection       LOS: 7 days    Time spent:    Sigurd Pac, MD [facility]   07/17/2024, 10:00 AM    "

## 2024-07-17 NOTE — Progress Notes (Signed)
 Physical Therapy Treatment Patient Details Name: [name] MRN: [id number] DOB: 09/20/1950 Today's Date: 07/17/2024   History of Present Illness 73 yo M adm 07/09/24 with LB edema, SOB, orthopnea, CHF, RLE cellulitis, Rt hydronephrosis. Pt with rapid response called on 12/18 transferred to ICU Due to labored breathing. PMHx: CAD, HTN, HLD, inclusion body myositis, DVT, chonic LB edema, paralyzed Rt hemidiaphragm    PT Comments  Pt trialed lateral scoot transfer this session requiring maximal physical assistance of 2 to complete. Pt requiring frequent reorienting to mobility task and step by step cueing for sequencing. Pillows placed under bilateral elbows, and LE's to reduce risk of pressure injuries. Pt would benefit from further transfer training. PT will continue to treat pt while he is admitted. Patient will benefit from intensive inpatient follow-up therapy, >3 hours/day.    If plan is discharge home, recommend the following: A lot of help with walking and/or transfers;A lot of help with bathing/dressing/bathroom;Assist for transportation;Help with stairs or ramp for entrance;Assistance with cooking/housework   Can travel by private vehicle        Equipment Recommendations  None recommended by PT    Recommendations for Other Services Rehab consult     Precautions / Restrictions Precautions Precautions: Fall Recall of Precautions/Restrictions: Intact Restrictions Weight Bearing Restrictions Per Provider Order: No     Mobility  Bed Mobility Overal bed mobility: Needs Assistance Bed Mobility: Supine to Sit     Supine to sit: HOB elevated, Used rails, Mod assist     General bed mobility comments: Pt able to advance BLE towards EOB but requires physical assistance for trunk management. VC given for sequencing; increased time to complete.    Transfers Overall transfer level: Needs assistance Equipment used: 2 person hand held assist Transfers: Bed to  chair/wheelchair/BSC            Lateral/Scoot Transfers: Max assist, +2 physical assistance General transfer comment: Pt requires max A of 2 for performing lateral scoot transfer from EOB to recliner on the R. Pt requiring frequent reorienting to task and cueing. Increased time and effort to complete. Pt attempted to stand with +2 physical assistance for maxi pad placement but unable to extend trunk or legs x4 attempts. With VC pt able to shift weight to opposite hip to assist with situating pad under placements opposite leg.    Ambulation/Gait                   Stairs             Wheelchair Mobility     Tilt Bed    Modified Rankin (Stroke Patients Only)       Balance Overall balance assessment: Needs assistance, Mild deficits observed, not formally tested Sitting-balance support: No upper extremity supported, Feet supported Sitting balance-Leahy Scale: Fair Sitting balance - Comments: able to sit EOB w/out UE support                                    Communication Communication Communication: Impaired Factors Affecting Communication: Hearing impaired  Cognition Arousal: Alert Behavior During Therapy: Restless   PT - Cognitive impairments: Sequencing, Problem solving, Initiation, Memory                         Following commands: Impaired Following commands impaired: Follows one step commands inconsistently    Cueing Cueing Techniques: Verbal cues, Tactile cues  Exercises      General Comments General comments (skin integrity, edema, etc.): VSS on 2L [place]      Pertinent Vitals/Pain Pain Assessment Pain Assessment: No/denies pain    Home Living                          Prior Function            PT Goals (current goals can now be found in the care plan section) Acute Rehab PT Goals Patient Stated Goal: to get stronger PT Goal Formulation: With patient Time For Goal Achievement: 07/25/24 Potential to  Achieve Goals: Fair Progress towards PT goals: Progressing toward goals    Frequency    Min 2X/week      PT Plan      Co-evaluation              AM-PAC PT 6 Clicks Mobility   Outcome Measure  Help needed turning from your back to your side while in a flat bed without using bedrails?: A Little Help needed moving from lying on your back to sitting on the side of a flat bed without using bedrails?: A Lot Help needed moving to and from a bed to a chair (including a wheelchair)?: Total Help needed standing up from a chair using your arms (e.g., wheelchair or bedside chair)?: Total Help needed to walk in hospital room?: Total Help needed climbing 3-5 steps with a railing? : Total 6 Click Score: 9    End of Session Equipment Utilized During Treatment: Gait belt;Oxygen Activity Tolerance: Patient tolerated treatment well Patient left: in chair;with call bell/phone within reach Nurse Communication: Mobility status PT Visit Diagnosis: Unsteadiness on feet (R26.81);Other abnormalities of gait and mobility (R26.89);Muscle weakness (generalized) (M62.81)     Time: 8870-8841 PT Time Calculation (min) (ACUTE ONLY): 29 min  Charges:    $Therapeutic Activity: 23-37 mins PT General Charges $$ ACUTE PT VISIT: 1 Visit                     Leontine Hilt DPT Acute Rehab Services [phone] Prefer contact via chat    Leontine KATHEE Hilt 07/17/2024, 12:39 PM

## 2024-07-17 NOTE — Progress Notes (Addendum)
" ° °  Inpatient Rehabilitation Admissions Coordinator   I met with patient, wife and son in law at bedside. I explained that he is currently not at level to pursue CIR admit. I recommend SNF level rehab at this time. I will follow his progress over the next 48 to 72 hrs. TOC made aware.  Heron Leavell, RN, MSN Rehab Admissions Coordinator [phone] 07/17/2024 1:38 PM  "

## 2024-07-17 NOTE — Progress Notes (Signed)
 Physical Therapy Treatment Patient Details Name: [name] MRN: [id number] DOB: 1951/02/06 Today's Date: 07/17/2024   History of Present Illness 73 yo M adm 07/09/24 with LB edema, SOB, orthopnea, CHF, RLE cellulitis, Rt hydronephrosis. Pt with rapid response called on 12/18 transferred to ICU Due to labored breathing. PMHx: CAD, HTN, HLD, inclusion body myositis, DVT, chonic LB edema, paralyzed Rt hemidiaphragm    PT Comments  PT called to assist with transfer back to bed after pt found with recliner chair tilted towards the floor, beginning to slide towards the ground with nursing. Per spouse, pt attempted to scoot forwards in recliner chair with feet elevated and tipped the chair. He denies pain and VSS. Slowly transitioned pt to sit flat on the floor. Placed maximove lift pad underneath him while in long sitting with totalA x3. PT maintained posterior support to prevent posterior lean and add stability to pt's trunk. Utilized maximove to transfer pt back to bed. Patient will benefit from continued inpatient follow up therapy, <3 hours/day.   If plan is discharge home, recommend the following: Two people to help with walking and/or transfers;A lot of help with bathing/dressing/bathroom;Assistance with cooking/housework;Assist for transportation;Help with stairs or ramp for entrance;Supervision due to cognitive status   Can travel by private vehicle     No  Equipment Recommendations  Hoyer lift;Wheelchair (measurements PT);Wheelchair cushion (measurements PT)    Recommendations for Other Services       Precautions / Restrictions Precautions Precautions: Fall Recall of Precautions/Restrictions: Impaired Restrictions Weight Bearing Restrictions Per Provider Order: No     Mobility  Bed Mobility Overal bed mobility: Needs Assistance Bed Mobility: Supine to Sit     Supine to sit: HOB elevated, Mod assist     General bed mobility comments: Transferred pt back supine in bed using  maximove. He opted to pull himself into long sitting with 2 HHA and modA instead of rolling R/L for lift pad to be removed.    Transfers Overall transfer level: Needs assistance   Transfers: Bed to chair/wheelchair/BSC             General transfer comment: Pt greeted sliding out of recliner chair that was tipped towards the ground with feet raised, but in contact with the ground and RN, NT, and Mobility Specialist present. Assisted in stabilizing pt's trunk with +2 assist where recliner was removed. Had pt lean R/L to placed maimove pad underneath his hips with +2-3 assist. Lifted each LE one at a time to bring lift pad underneath legs and crossed them. Utilized maximove lift to transfer pt back to bed. Transfer via Lift Equipment: Maximove  Ambulation/Gait                   Stairs             Wheelchair Mobility     Tilt Bed    Modified Rankin (Stroke Patients Only)       Balance Overall balance assessment: Needs assistance Sitting-balance support: Bilateral upper extremity supported, Feet supported Sitting balance-Leahy Scale: Poor Sitting balance - Comments: Pt seated on floor in long sitting. Pt required posterior support by pt to prevent lean and maintian upright posture. Postural control: Posterior lean                                  Communication Communication Communication: Impaired Factors Affecting Communication: Hearing impaired  Cognition Arousal: Alert Behavior During Therapy: Flat  affect   PT - Cognitive impairments: Safety/Judgement, Awareness, Problem solving, Sequencing, Attention, Initiation                       PT - Cognition Comments: Pt with poor safety awarness. Wife reports pt attempted to scoot forward in recliner chair while legs were elevated and managed to tip the chair where the feet were touching the ground and the back of the chair was angled towards the ground. Decreased insight into current  situation. Pleasantly confused. Following commands: Impaired Following commands impaired: Follows one step commands inconsistently    Cueing Cueing Techniques: Verbal cues, Tactile cues  Exercises      General Comments General comments (skin integrity, edema, etc.): VSS on 2L O2 via .      Pertinent Vitals/Pain Pain Assessment Pain Assessment: No/denies pain    Home Living                          Prior Function            PT Goals (current goals can now be found in the care plan section) Acute Rehab PT Goals Patient Stated Goal: Stay safe PT Goal Formulation: With patient Time For Goal Achievement: 07/25/24 Potential to Achieve Goals: Fair Progress towards PT goals: Not progressing toward goals - comment (pt dependent on lift to transfer back to bed)    Frequency    Min 2X/week      PT Plan      Co-evaluation              AM-PAC PT 6 Clicks Mobility   Outcome Measure  Help needed turning from your back to your side while in a flat bed without using bedrails?: Total Help needed moving from lying on your back to sitting on the side of a flat bed without using bedrails?: Total Help needed moving to and from a bed to a chair (including a wheelchair)?: Total Help needed standing up from a chair using your arms (e.g., wheelchair or bedside chair)?: Total Help needed to walk in hospital room?: Total Help needed climbing 3-5 steps with a railing? : Total 6 Click Score: 6    End of Session Equipment Utilized During Treatment: Oxygen Activity Tolerance: Patient tolerated treatment well Patient left: in bed;with bed alarm set;with nursing/sitter in room;with family/visitor present;with call bell/phone within reach Nurse Communication: Mobility status;Need for lift equipment (maximove) PT Visit Diagnosis: Unsteadiness on feet (R26.81);Other abnormalities of gait and mobility (R26.89);Muscle weakness (generalized) (M62.81)     Time: 8459-8446 PT  Time Calculation (min) (ACUTE ONLY): 13 min  Charges:    $Therapeutic Activity: 8-22 mins PT General Charges $$ ACUTE PT VISIT: 1 Visit                     Randall SAUNDERS, PT, DPT Acute Rehabilitation Services Office: [phone] Secure Chat Preferred   Delon CHRISTELLA Callander 07/17/2024, 4:56 PM

## 2024-07-17 NOTE — Plan of Care (Signed)

## 2024-07-17 NOTE — NC FL2 (Signed)
 " [place]  MEDICAID FL2 LEVEL OF CARE FORM     IDENTIFICATION  Patient Name: [name] Birthdate: 05-24-1951 Sex: adult Admission Date (Current Location): 07/09/2024  [facility] and [place] Number:  Producer, Television/film/video and Address:  [facility], [address], [place], [place] [address]      Provider Number: [id number]  Attending Physician Name and Address:  Fairy Frames, MD  Relative Name and Phone Number:  Sylvio, [place], Emergency Contact  [phone] (Mobile)    Current Level of Care:   Recommended Level of Care: Skilled Nursing Facility Prior Approval Number:    Date Approved/Denied:   PASRR Number: [id number]  Discharge Plan: SNF    Current Diagnoses: Patient Active Problem List   Diagnosis Date Noted   Septic shock (HCC) 07/12/2024   Acute on chronic heart failure with preserved ejection fraction (HFpEF) (HCC) 07/09/2024   Cellulitis of right lower extremity 07/09/2024   Elevated CK 07/09/2024   AKI (acute kidney injury) 07/09/2024   History of DVT (deep vein thrombosis) 03/04/2022   Unilateral primary osteoarthritis, right hip 02/26/2020   Chronic pain syndrome 09/13/2019   Macrocytic anemia 06/12/2019   Screening for viral disease 06/12/2019   S/P cervical spinal fusion 05/22/2019   Cervical facet joint syndrome 05/22/2019   Chronic radicular lumbar pain 05/22/2019   Lumbar degenerative disc disease 05/22/2019   Lumbar spondylosis 05/22/2019   Lumbar facet arthropathy 05/22/2019   Insomnia 05/16/2019   Bilateral lower extremity edema 04/04/2019   BMI 37.0-37.9, adult 07/20/2018   Encounter for Medicare annual wellness exam 04/27/2018   Colon cancer screening 04/27/2018   Flu vaccine need 04/27/2018   Healthcare maintenance 02/15/2018   Stented coronary artery    Myositis    Hyperlipidemia    Coronary artery disease    Spinal stenosis    Weakness    HTN (hypertension)     Orientation RESPIRATION BLADDER Height  & Weight     Self, Time, Situation, Place  O2 (2L nasal cannula) Incontinent Weight: (!) 308 lb 6.8 oz (139.9 kg) Height:  5' 10 (177.8 cm)  BEHAVIORAL SYMPTOMS/MOOD NEUROLOGICAL BOWEL NUTRITION STATUS      Continent Diet (see d/c summary)  AMBULATORY STATUS COMMUNICATION OF NEEDS Skin   Extensive Assist Verbally PU Stage and Appropriate Care, Other (Comment) (right leg wound, right ankle wound, perenium irritant dermitatis, lower abdomen irritant dermititis, buttocks irritant dermatitis, trauma to right arm, pressure injury buttocks left)                       Personal Care Assistance Level of Assistance  Bathing, Feeding, Dressing Bathing Assistance: Maximum assistance Feeding assistance: Independent Dressing Assistance: Limited assistance     Functional Limitations Info  Sight, Hearing, Speech Sight Info: Impaired Hearing Info: Impaired Speech Info: Adequate    SPECIAL CARE FACTORS FREQUENCY  PT (By licensed PT), OT (By licensed OT)     PT Frequency: 5x/week OT Frequency: 5x/week            Contractures Contractures Info: Not present    Additional Factors Info  Code Status, Allergies Code Status Info: full Allergies Info: no known allergies           Current Medications (07/17/2024):  This is the current hospital active medication list Current Facility-Administered Medications  Medication Dose Route Frequency Provider Last Rate Last Admin   0.9 %  sodium chloride  infusion (Manually program via Guardrails IV Fluids)   Intravenous Once  Perri DELENA Meliton Mickey., MD       acetaminophen  (TYLENOL ) tablet 650 mg  650 mg Oral Q6H PRN Fausto Sor A, DO       Or   acetaminophen  (TYLENOL ) suppository 650 mg  650 mg Rectal Q6H PRN Fausto Sor A, DO       apixaban  (ELIQUIS ) tablet 2.5 mg  2.5 mg Oral BID Fausto Sor A, DO   2.5 mg at 07/17/24 1004   atenolol  (TENORMIN ) tablet 25 mg  25 mg Oral Daily Joseph, Preetha, MD   25 mg at 07/17/24 1318   bisacodyl   (DULCOLAX) EC tablet 5 mg  5 mg Oral Daily Arloa Folks D, NP   5 mg at 07/17/24 1004   ceFEPIme  (MAXIPIME ) 2 g in sodium chloride  0.9 % 100 mL IVPB  2 g Intravenous Q8H Fairy Frames, MD 200 mL/hr at 07/17/24 1003 2 g at 07/17/24 1003   Chlorhexidine  Gluconate Cloth 2 % PADS 6 each  6 each Topical Daily Joseph, Preetha, MD       folic acid  (FOLVITE ) tablet 1 mg  1 mg Oral Daily Perri DELENA Meliton Mickey., MD   1 mg at 07/17/24 1004   Gerhardt's butt cream   Topical Daily Perri DELENA Meliton Mickey., MD   Given at 07/17/24 1008   HYDROmorphone  (DILAUDID ) injection 0.5 mg  0.5 mg Intravenous Q2H PRN Howerter, Justin B, DO       [START ON 07/18/2024] Influenza vac split trivalent PF (FLUZONE  HIGH-DOSE) injection 0.5 mL  0.5 mL Intramuscular Tomorrow-1000 Fairy Frames, MD       lidocaine  (LIDODERM ) 5 % 1 patch  1 patch Transdermal Q24H Perri DELENA Meliton Mickey., MD   1 patch at 07/14/24 0900   linezolid  (ZYVOX ) tablet 600 mg  600 mg Oral Q12H Joseph, Preetha, MD   600 mg at 07/17/24 1004   melatonin tablet 10 mg  10 mg Oral QHS Howerter, Justin B, DO   10 mg at 07/16/24 2142   multivitamin with minerals tablet 1 tablet  1 tablet Oral Daily Howerter, Justin B, DO   1 tablet at 07/17/24 1004   mupirocin  ointment (BACTROBAN ) 2 % 1 Application  1 Application Nasal BID Gatha Pence, MD   1 Application at 07/17/24 1322   ondansetron  (ZOFRAN ) tablet 4 mg  4 mg Oral Q6H PRN Fausto Sor A, DO       Or   ondansetron  (ZOFRAN ) injection 4 mg  4 mg Intravenous Q6H PRN Fausto Sor A, DO       oxyCODONE  (Oxy IR/ROXICODONE ) immediate release tablet 5 mg  5 mg Oral Q4H PRN Fausto Sor A, DO   5 mg at 07/17/24 1333   polyethylene glycol (MIRALAX  / GLYCOLAX ) packet 17 g  17 g Oral Daily Arloa Folks D, NP   17 g at 07/17/24 1005   senna-docusate (Senokot-S) tablet 1 tablet  1 tablet Oral Daily Swayze, Ava, DO   1 tablet at 07/17/24 1004   sodium chloride  flush (NS) 0.9 % injection 10-40 mL  10-40 mL  Intracatheter Q12H Perri DELENA Meliton Mickey., MD   10 mL at 07/16/24 2143   sodium chloride  flush (NS) 0.9 % injection 10-40 mL  10-40 mL Intracatheter PRN Perri DELENA Meliton Mickey., MD       tamsulosin  (FLOMAX ) capsule 0.4 mg  0.4 mg Oral Daily Perri DELENA Meliton Mickey., MD   0.4 mg at 07/17/24 1004   thiamine  (VITAMIN B1) 250 mg in sodium chloride  0.9 % 50 mL  IVPB  250 mg Intravenous Daily Perri DELENA Meliton Mickey., MD 105 mL/hr at 07/17/24 1338 250 mg at 07/17/24 1338   Followed by   [name] ON 07/22/2024] thiamine  (VITAMIN B1) tablet 100 mg  100 mg Oral Daily Perri DELENA Meliton Mickey., MD         Discharge Medications: Please see discharge summary for a list of discharge medications.  Relevant Imaging Results:  Relevant Lab Results:   Additional Information SSN [id number] [place], LCSW     "

## 2024-07-18 DIAGNOSIS — N179 Acute kidney failure, unspecified: Secondary | ICD-10-CM | POA: Diagnosis not present

## 2024-07-18 DIAGNOSIS — I10 Essential (primary) hypertension: Secondary | ICD-10-CM | POA: Diagnosis not present

## 2024-07-18 DIAGNOSIS — G8929 Other chronic pain: Secondary | ICD-10-CM

## 2024-07-18 DIAGNOSIS — I2510 Atherosclerotic heart disease of native coronary artery without angina pectoris: Secondary | ICD-10-CM

## 2024-07-18 DIAGNOSIS — E66813 Obesity, class 3: Secondary | ICD-10-CM

## 2024-07-18 DIAGNOSIS — Z86718 Personal history of other venous thrombosis and embolism: Secondary | ICD-10-CM

## 2024-07-18 DIAGNOSIS — I5033 Acute on chronic diastolic (congestive) heart failure: Secondary | ICD-10-CM | POA: Diagnosis not present

## 2024-07-18 DIAGNOSIS — M5416 Radiculopathy, lumbar region: Secondary | ICD-10-CM

## 2024-07-18 DIAGNOSIS — L03115 Cellulitis of right lower limb: Secondary | ICD-10-CM

## 2024-07-18 DIAGNOSIS — G7241 Inclusion body myositis [IBM]: Secondary | ICD-10-CM | POA: Diagnosis not present

## 2024-07-18 DIAGNOSIS — E785 Hyperlipidemia, unspecified: Secondary | ICD-10-CM

## 2024-07-18 LAB — CBC
HCT: 29.1 % — ABNORMAL LOW (ref 39.0–52.0)
Hemoglobin: 9.2 g/dL — ABNORMAL LOW (ref 13.0–17.0)
MCH: 35.2 pg — ABNORMAL HIGH (ref 26.0–34.0)
MCHC: 31.6 g/dL (ref 30.0–36.0)
MCV: 111.5 fL — ABNORMAL HIGH (ref 80.0–100.0)
Platelets: 53 K/uL — ABNORMAL LOW (ref 150–400)
RBC: 2.61 MIL/uL — ABNORMAL LOW (ref 4.22–5.81)
RDW: 13.8 % (ref 11.5–15.5)
WBC: 5.6 K/uL (ref 4.0–10.5)
nRBC: 0 % (ref 0.0–0.2)

## 2024-07-18 LAB — BASIC METABOLIC PANEL WITH GFR
Anion gap: 5 (ref 5–15)
BUN: 21 mg/dL (ref 8–23)
CO2: 34 mmol/L — ABNORMAL HIGH (ref 22–32)
Calcium: 9.2 mg/dL (ref 8.9–10.3)
Chloride: 100 mmol/L (ref 98–111)
Creatinine, Ser: 0.65 mg/dL (ref 0.61–1.24)
GFR, Estimated: >60 mL/min
Glucose, Bld: 67 mg/dL — ABNORMAL LOW (ref 70–99)
Potassium: 4.7 mmol/L (ref 3.5–5.1)
Sodium: 140 mmol/L (ref 135–145)

## 2024-07-18 MED ORDER — JUVEN PO PACK
1.0000 | PACK | Freq: Two times a day (BID) | ORAL | Status: DC
  Administered 2024-07-18 – 2024-07-20 (×4): 1 via ORAL
  Filled 2024-07-18 (×4): qty 1

## 2024-07-18 MED ORDER — ROSUVASTATIN CALCIUM 5 MG PO TABS
5.0000 mg | ORAL_TABLET | Freq: Every day | ORAL | Status: DC
  Administered 2024-07-18 – 2024-07-19 (×2): 5 mg via ORAL
  Filled 2024-07-18 (×2): qty 1

## 2024-07-18 MED ORDER — THIAMINE MONONITRATE 100 MG PO TABS
100.0000 mg | ORAL_TABLET | Freq: Every day | ORAL | Status: DC
  Administered 2024-07-18 – 2024-07-20 (×3): 100 mg via ORAL
  Filled 2024-07-18 (×3): qty 1

## 2024-07-18 MED ORDER — ENSURE PLUS HIGH PROTEIN PO LIQD
237.0000 mL | Freq: Three times a day (TID) | ORAL | Status: DC
  Administered 2024-07-18 – 2024-07-19 (×4): 237 mL via ORAL

## 2024-07-18 NOTE — Assessment & Plan Note (Signed)
 Calculated BMI is 44.4

## 2024-07-18 NOTE — Plan of Care (Signed)
  Problem: Clinical Measurements: Goal: Respiratory complications will improve Outcome: Progressing Goal: Cardiovascular complication will be avoided Outcome: Progressing   Problem: Coping: Goal: Level of anxiety will decrease Outcome: Progressing   Problem: Pain Managment: Goal: General experience of comfort will improve and/or be controlled Outcome: Progressing   Problem: Safety: Goal: Ability to remain free from injury will improve Outcome: Progressing

## 2024-07-18 NOTE — Plan of Care (Signed)
  Problem: Education: Goal: Knowledge of General Education information will improve Description: Including pain rating scale, medication(s)/side effects and non-pharmacologic comfort measures Outcome: Progressing   Problem: Health Behavior/Discharge Planning: Goal: Ability to manage health-related needs will improve Outcome: Progressing   Problem: Clinical Measurements: Goal: Ability to maintain clinical measurements within normal limits will improve Outcome: Progressing Goal: Will remain free from infection Outcome: Progressing Goal: Diagnostic test results will improve Outcome: Progressing Goal: Respiratory complications will improve Outcome: Progressing Goal: Cardiovascular complication will be avoided Outcome: Progressing   Problem: Activity: Goal: Risk for activity intolerance will decrease Outcome: Progressing   Problem: Nutrition: Goal: Adequate nutrition will be maintained Outcome: Progressing   Problem: Coping: Goal: Level of anxiety will decrease Outcome: Progressing   Problem: Elimination: Goal: Will not experience complications related to bowel motility Outcome: Progressing Goal: Will not experience complications related to urinary retention Outcome: Progressing   Problem: Pain Managment: Goal: General experience of comfort will improve and/or be controlled Outcome: Progressing   Problem: Safety: Goal: Ability to remain free from injury will improve Outcome: Progressing   Problem: Skin Integrity: Goal: Risk for impaired skin integrity will decrease Outcome: Progressing   Problem: Clinical Measurements: Goal: Ability to avoid or minimize complications of infection will improve Outcome: Progressing   Problem: Skin Integrity: Goal: Skin integrity will improve Outcome: Progressing   Problem: Education: Goal: Ability to demonstrate management of disease process will improve Outcome: Progressing Goal: Ability to verbalize understanding of medication  therapies will improve Outcome: Progressing Goal: Individualized Educational Video(s) Outcome: Progressing   Problem: Activity: Goal: Capacity to carry out activities will improve Outcome: Progressing   Problem: Cardiac: Goal: Ability to achieve and maintain adequate cardiopulmonary perfusion will improve Outcome: Progressing

## 2024-07-18 NOTE — Hospital Course (Signed)
 Mr. Brisco was admitted to the hospital with the working diagnosis of right lower extremity cellulitis complicated with septic shock.   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    - Admitted with CHF exacerbation, wounds - Hospital course complicated by hypotension and shock 07/12/2024 - Moved to ICU for septic shock needing levophed . 12/19 BP improved, pressors weaned off  Transferred from PCCM to [facility] on 12/20

## 2024-07-18 NOTE — Assessment & Plan Note (Addendum)
 Echocardiogram with preserved LV systolic function with EF 60 to 65%, mild LVH, EA fusion, RV not well visualized, LA and RA with mild dilatation, no significant valvular disease.   Systolic blood pressure 130 mmHg range.   Plan to continue atenolol  Hold on furosemide  for now.  Continue blood pressure monitoring

## 2024-07-18 NOTE — Progress Notes (Signed)
 Occupational Therapy Treatment Patient Details Name: [name] MRN: [id number] DOB: 1951/03/26 Today's Date: 07/18/2024   History of present illness 73 yo M adm 07/09/24 with LB edema, SOB, orthopnea, CHF, RLE cellulitis, Rt hydronephrosis. Pt with rapid response called on 12/18 transferred to ICU Due to labored breathing. PMHx: CAD, HTN, HLD, inclusion body myositis, DVT, chonic LB edema, paralyzed Rt hemidiaphragm   OT comments  Patient demonstrating gains with bed mobility with mod assist to get to EOB. Patient able to stand in stedy with mod assist +2. Patient asking to use [facility] and was transferred via [facility]. Patient required max assist for toilet hygiene while standing in stedy with min assist for standing balance. Patient returned to EOB and assisted back to supine.  Patient will benefit from intensive inpatient follow-up therapy, >3 hours/day.  Acute OT to continue to follow to address established goals to facilitate DC to next venue of care.      If plan is discharge home, recommend the following:  Two people to help with walking and/or transfers;Two people to help with bathing/dressing/bathroom   Equipment Recommendations  None recommended by OT    Recommendations for Other Services      Precautions / Restrictions Precautions Precautions: Fall Recall of Precautions/Restrictions: Impaired Restrictions Weight Bearing Restrictions Per Provider Order: No       Mobility Bed Mobility Overal bed mobility: Needs Assistance Bed Mobility: Supine to Sit, Sit to Supine     Supine to sit: HOB elevated, Mod assist Sit to supine: Mod assist   General bed mobility comments: assistance for trunk and assistance for RLE    Transfers Overall transfer level: Needs assistance Equipment used: Ambulation equipment used Transfers: Sit to/from Stand, Bed to chair/wheelchair/BSC Sit to Stand: Mod assist, +2 physical assistance           General transfer comment: stood from EOB and  BSC with mod assist +2, able to stand from Peck pads with min assist Transfer via Lift Equipment: Stedy   Balance Overall balance assessment: Needs assistance Sitting-balance support: Bilateral upper extremity supported, Feet supported, Single extremity supported Sitting balance-Leahy Scale: Poor Sitting balance - Comments: CGA for safety Postural control: Posterior lean Standing balance support: Bilateral upper extremity supported, During functional activity, Reliant on assistive device for balance Standing balance-Leahy Scale: Poor Standing balance comment: reliant on Stedy for support                           ADL either performed or assessed with clinical judgement   ADL Overall ADL's : Needs assistance/impaired     Grooming: Wash/dry hands;Minimal assistance;Sitting Grooming Details (indicate cue type and reason): EOB                 Toilet Transfer: Moderate assistance;+2 for physical assistance;BSC/3in1 Laurent) Toilet Transfer Details (indicate cue type and reason): stedy transfer from EOB to [facility] Toileting- Clothing Manipulation and Hygiene: Maximal assistance;Sit to/from stand Toileting - Clothing Manipulation Details (indicate cue type and reason): patient stood in stedy with max assist for toilet hygiene            Extremity/Trunk Assessment              Vision       Perception     Praxis     Communication Communication Communication: Impaired Factors Affecting Communication: Hearing impaired   Cognition Arousal: Alert Behavior During Therapy: Flat affect Cognition: No apparent impairments  Following commands: Impaired Following commands impaired: Follows one step commands with increased time, Follows multi-step commands inconsistently      Cueing   Cueing Techniques: Verbal cues, Tactile cues  Exercises      Shoulder Instructions       General Comments 86% Spo2 on RA, 94% on 3.5  liters    Pertinent Vitals/ Pain       Pain Assessment Pain Assessment: No/denies pain  Home Living                                          Prior Functioning/Environment              Frequency  Min 2X/week        Progress Toward Goals  OT Goals(current goals can now be found in the care plan section)  Progress towards OT goals: Progressing toward goals     Plan      Co-evaluation                 AM-PAC OT 6 Clicks Daily Activity     Outcome Measure   Help from another person eating meals?: None Help from another person taking care of personal grooming?: A Little Help from another person toileting, which includes using toliet, bedpan, or urinal?: A Lot Help from another person bathing (including washing, rinsing, drying)?: A Lot Help from another person to put on and taking off regular upper body clothing?: A Little Help from another person to put on and taking off regular lower body clothing?: Total 6 Click Score: 15    End of Session Equipment Utilized During Treatment: Gait belt;Other (comment);Oxygen Laurent)  OT Visit Diagnosis: Unsteadiness on feet (R26.81);Muscle weakness (generalized) (M62.81)   Activity Tolerance Patient tolerated treatment well   Patient Left in bed;with call bell/phone within reach;with bed alarm set   Nurse Communication Mobility status        Time: 8796-8763 OT Time Calculation (min): 33 min  Charges: OT General Charges $OT Visit: 1 Visit OT Treatments $Self Care/Home Management : 23-37 mins  Dick Laine, OTA Acute Rehabilitation Services  Office [phone]   Jeb LITTIE Laine 07/18/2024, 2:20 PM

## 2024-07-18 NOTE — Progress Notes (Signed)
" ° °  Inpatient Rehabilitation Admissions Coordinator   We will sign off.  Heron Leavell, RN, MSN Rehab Admissions Coordinator [phone] 07/18/2024 4:15 PM  "

## 2024-07-18 NOTE — Progress Notes (Signed)
 " Progress Note   Patient: [name] FMW:[id number] DOB: [birth year] DOA: 07/09/2024     8 DOS: the patient was seen and examined on 07/18/2024   Brief hospital course: [name] was admitted to the hospital with the working diagnosis of right lower extremity cellulitis complicated with septic shock.   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    - Admitted with CHF exacerbation, wounds - Hospital course complicated by hypotension and shock 07/12/2024 - Moved to ICU for septic shock needing levophed . 12/19 BP improved, pressors weaned off  Transferred from PCCM to [facility] on 12/20  Assessment and Plan: * Cellulitis of right lower extremity Complicated with septic shock, now resolved.   Patient has been afebrile, no leukocytosis.   Has been on cefepime  since 12/22 and linezolid  since 12/20  Continue local wound care.  Follow up cell count and temperature curve.   Acute on chronic diastolic CHF (congestive heart failure) (HCC) Echocardiogram with preserved LV systolic function with EF 60 to 65%, mild LVH, EA fusion, RV not well visualized, LA and RA with mild dilatation, no significant valvular disease.   Systolic blood pressure 130 mmHg range.   Plan to continue atenolol  Hold on furosemide  for now.  Continue blood pressure monitoring   Coronary artery disease High sensitive troponin elevation due to sepsis, no acute coronary syndrome.  Continue blood pressure control, not on statin or antiplatelet therapy   HTN (hypertension) Continue blood pressure control with atenolol .   AKI (acute kidney injury) Hyperkalemia. Impending rhabdomyolysis (present on  admission)   Renal function with improved serum cr at 0,65 with K at 4,7 and serum bicarbonate at 34 Na 140  Plan to continue close monitoring renal function and electrolytes.   Inclusion body myositis (HCC) Patient not at baseline, continue very weak and deconditioned.   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  Continue Bipap support at night.   Hyperlipidemia Resume home dose of rosuvastatin   Ck is down to 85.   Chronic radicular lumbar pain Continue as needed analgesics.  PT and OT   History of DVT (deep vein thrombosis) Lower extremity doppler US  was negative for left lower extremity deep venous thrombosis.  Continue anticoagulation with apixaban .    Obesity, class 3 (HCC) Calculated BMI is 44.4         Subjective: Patient continue very weak and deconditioned, positive dyspnea, improved with bipap.   Physical Exam: Vitals:   07/17/24 1928 07/17/24 2353 07/18/24 0325 07/18/24 0725  BP: 138/71 102/87 118/61 130/60  Pulse: 93 86 83 76  Resp: 20 16 20  (!) 22  Temp: 98.4 F (36.9 C) 97.9 F (36.6 C) 98 F (36.7 C) 98.2 F (36.8 C)  TempSrc: Oral Axillary Axillary Oral  SpO2: 96% 100% 100% 94%  Weight:   (!) 140.4 kg   Height:       Neurology awake and alert, deconditioned and ill looking appearing ENT with positive pallor with no icterus Cardiovascular with S1 and S2 present and regular with no gallops, rubs or murmurs Respiratory with poor inspiratory effort, with no wheezing or rhonchi, anterior auscultation  Abdomen protuberant, soft and non tender Positive lower extremity edema, more right than left, wound dressing on the right lower extremity  Data Reviewed:    Family Communication: I spoke with patient's daughter at the bedside, we talked in detail about patient's condition, plan of care and prognosis and all questions were addressed.   Disposition: Status is: Inpatient Remains inpatient appropriate because: recovering  shock  Planned Discharge Destination: Skilled nursing facility     Author: Elidia Toribio Furnace, MD 07/18/2024 11:35 AM  For on call review [email or web address].  "

## 2024-07-18 NOTE — TOC Progression Note (Signed)
 Transition of Care [facility]) - Progression Note    Patient Details  Name: [name] MRN: [id number] Date of Birth: 12/23/1950  Transition [facility]) CM/SW Contact  Luann SHAUNNA [place], [place] Phone Number: 07/18/2024, 2:03 PM  Clinical Narrative:     Provided SNF bed offers bedside; pt sleeping. CSW called pt's spouse and explained bed offer list and medicare star ratings are bedside. She is on the way to the hospital and will review list.   CSW inquired if pt uses bipap at home which spouse states he does not. If pt needs bipap at SNF; snf will need to order one.   Expected Discharge Plan: Skilled Nursing Facility Barriers to Discharge: SNF Pending bed offer               Expected Discharge Plan and Services In-house Referral: NA Discharge Planning Services: CM Consult Post Acute Care Choice: Home Health Living arrangements for the past 2 months: Skilled Nursing Facility                   DME Agency: NA                   Social Drivers of Health (SDOH) Interventions SDOH Screenings   Food Insecurity: No Food Insecurity (07/10/2024)  Housing: Low Risk (07/10/2024)  Transportation Needs: No Transportation Needs (07/10/2024)  Utilities: Not At Risk (07/10/2024)  Alcohol Screen: Low Risk (06/05/2024)  Depression (PHQ2-9): Low Risk (12/28/2023)  Financial Resource Strain: Low Risk (06/05/2024)  Physical Activity: Inactive (06/05/2024)  Social Connections: Socially Isolated (07/10/2024)  Stress: No Stress Concern Present (06/05/2024)  Tobacco Use: Medium Risk (07/09/2024)  Health Literacy: Adequate Health Literacy (12/28/2023)    Readmission Risk Interventions     No data to display

## 2024-07-18 NOTE — Progress Notes (Addendum)
 Initial Nutrition Assessment  DOCUMENTATION CODES:  Morbid obesity  INTERVENTION:  Liberalize diet to regular diet to encourage PO intake. Room service assistance. Ensure Plus High Protein PO TID. Each supplement provides 350 Kcals and 20 grams of protein. 1 packet Juven BID to support wound healing. Each packet provides 95 calories, 2.5 grams of protein (collagen), and 9.8 grams of carbohydrate (3 grams sugar); also contains 7 grams of L-arginine and L-glutamine, 300 mg vitamin C, 15 mg vitamin E, 1.2 mcg vitamin B-12, 9.5 mg zinc, 200 mg calcium , and 1.5 g Calcium  Beta-hydroxy-Beta-methylbutyrate. Resume 100 mg thiamine  PO once daily. Continue Multivitamin PO once daily.   NUTRITION DIAGNOSIS:  Increased nutrient needs related to wound healing as evidenced by estimated needs   GOAL:  Patient will meet greater than or equal to 90% of their needs   MONITOR:  PO intake, Supplement acceptance, Labs, Weight trends, Skin, I & O's  REASON FOR ASSESSMENT:  Consult Assessment of nutrition requirement/status  ASSESSMENT:  Patient presented with worsening lower extremity swelling and orthopnea and was found to have CHF exacerbation, cellulitis, and septic shock with hypotension. 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  12/18 transferred to ICU for pressor support 12/20 transferred back to floors 12/23 did not qualify for CIR admit, SNF recommended; wound RN notes DTI to bilateral buttocks which was POA.  The patient presented to ED with his daughter who related that the patient has been increasingly weak and that the swelling in his legs had been increasing for about a month. RD on remote coverage. Attempted phone call to the patient was unanswered. Discussed with RN who states the patient reports not having  an appetite for food but has been drinking well.  Scheduled Meds:  sodium chloride    Intravenous Once   apixaban   2.5 mg Oral BID   atenolol   25 mg Oral Daily   bisacodyl   5 mg Oral Daily   Chlorhexidine  Gluconate Cloth  6 each Topical Daily   Gerhardt's butt cream   Topical Daily   Influenza vac split trivalent PF  0.5 mL Intramuscular Tomorrow-1000   lidocaine   1 patch Transdermal Q24H   linezolid   600 mg Oral Q12H   melatonin  10 mg Oral QHS   multivitamin with minerals  1 tablet Oral Daily   polyethylene glycol  17 g Oral Daily   rosuvastatin   5 mg Oral Daily   senna-docusate  1 tablet Oral Daily   sodium chloride  flush  10-40 mL Intracatheter Q12H   tamsulosin   0.4 mg Oral Daily  Thiamine  IV completed today  Continuous Infusions:  ceFEPime  (MAXIPIME ) IV 2 g (07/18/24 0927)   PRN Meds:.acetaminophen  **OR** acetaminophen , HYDROmorphone  (DILAUDID ) injection, ondansetron  **OR** ondansetron  (ZOFRAN ) IV, oxyCODONE , sodium chloride  flush  Diet Order             Diet Heart Room service appropriate? Yes; Fluid consistency: Thin  Diet effective now                  Meal Intake: 0-50% the past 3 days  Labs:     Latest Ref Rng & Units 07/18/2024    4:41 AM 07/17/2024    4:58 AM 07/17/2024   12:05 AM  CMP  Glucose 70 - 99 mg/dL 67  89    BUN 8 - 23 mg/dL 21  26  Creatinine 0.61 - 1.24 mg/dL 9.34  9.30  9.30   Sodium 135 - 145 mmol/L 140  140    Potassium 3.5 - 5.1 mmol/L 4.7  5.2    Chloride 98 - 111 mmol/L 100  103    CO2 22 - 32 mmol/L 34  33    Calcium  8.9 - 10.3 mg/dL 9.2  9.6      I/O: -8 L since admit  NUTRITION - FOCUSED PHYSICAL EXAM: Deferred due to remote RD coverage  EDUCATION NEEDS:  Not appropriate for education at this time  Skin:  Skin Assessment: Skin Integrity Issues: Skin Integrity Issues:: DTI DTI: buttocks  Last BM:  12/24 type 5  Height:  Ht Readings from Last 1 Encounters:  07/13/24 5' 10 (1.778 m)   Weight:  Wt Readings  from Last 10 Encounters:  07/18/24 (!) 140.4 kg  07/05/24 (!) 137 kg  06/06/24 136 kg  05/09/24 (!) 136.2 kg  04/24/24 (!) 136.5 kg  03/29/24 (!) 137.9 kg  12/26/23 134 kg  12/12/23 132.8 kg  11/10/23 131.5 kg  10/31/23 127 kg   Weight Change: gradual weight gain the past 6 months possibly partly due to fluid status/edema  Usual Body Weight: unable to determine  Edema: +2 generalized  Ideal Body Weight:  75.5 kg   BMI:  Body mass index is 44.41 kg/m.  Estimated Daily Nutritional Needs:  Kcal:  2000-2200 Protein:  120-140 g Fluid:  >/=2000 mL    [name] Ruth, MS, RDN, LDN [place]. [facility] See AMION for contact information Secure chat preferred

## 2024-07-19 DIAGNOSIS — M5416 Radiculopathy, lumbar region: Secondary | ICD-10-CM | POA: Diagnosis not present

## 2024-07-19 DIAGNOSIS — G7241 Inclusion body myositis [IBM]: Secondary | ICD-10-CM | POA: Diagnosis not present

## 2024-07-19 DIAGNOSIS — G8929 Other chronic pain: Secondary | ICD-10-CM | POA: Diagnosis not present

## 2024-07-19 DIAGNOSIS — E66813 Obesity, class 3: Secondary | ICD-10-CM | POA: Diagnosis not present

## 2024-07-19 DIAGNOSIS — L03115 Cellulitis of right lower limb: Secondary | ICD-10-CM | POA: Diagnosis not present

## 2024-07-19 DIAGNOSIS — I10 Essential (primary) hypertension: Secondary | ICD-10-CM | POA: Diagnosis not present

## 2024-07-19 DIAGNOSIS — N179 Acute kidney failure, unspecified: Secondary | ICD-10-CM | POA: Diagnosis not present

## 2024-07-19 DIAGNOSIS — Z86718 Personal history of other venous thrombosis and embolism: Secondary | ICD-10-CM | POA: Diagnosis not present

## 2024-07-19 DIAGNOSIS — I5033 Acute on chronic diastolic (congestive) heart failure: Secondary | ICD-10-CM | POA: Diagnosis not present

## 2024-07-19 DIAGNOSIS — I2510 Atherosclerotic heart disease of native coronary artery without angina pectoris: Secondary | ICD-10-CM | POA: Diagnosis not present

## 2024-07-19 DIAGNOSIS — E785 Hyperlipidemia, unspecified: Secondary | ICD-10-CM | POA: Diagnosis not present

## 2024-07-19 LAB — CBC
HCT: 27.2 % — ABNORMAL LOW (ref 39.0–52.0)
Hemoglobin: 8.9 g/dL — ABNORMAL LOW (ref 13.0–17.0)
MCH: 35.5 pg — ABNORMAL HIGH (ref 26.0–34.0)
MCHC: 32.7 g/dL (ref 30.0–36.0)
MCV: 108.4 fL — ABNORMAL HIGH (ref 80.0–100.0)
Platelets: 50 K/uL — ABNORMAL LOW (ref 150–400)
RBC: 2.51 MIL/uL — ABNORMAL LOW (ref 4.22–5.81)
RDW: 13.9 % (ref 11.5–15.5)
WBC: 5.1 K/uL (ref 4.0–10.5)
nRBC: 0 % (ref 0.0–0.2)

## 2024-07-19 LAB — BASIC METABOLIC PANEL WITH GFR
Anion gap: 6 (ref 5–15)
BUN: 23 mg/dL (ref 8–23)
CO2: 33 mmol/L — ABNORMAL HIGH (ref 22–32)
Calcium: 9.1 mg/dL (ref 8.9–10.3)
Chloride: 100 mmol/L (ref 98–111)
Creatinine, Ser: 0.63 mg/dL (ref 0.61–1.24)
GFR, Estimated: >60 mL/min
Glucose, Bld: 93 mg/dL (ref 70–99)
Potassium: 4.5 mmol/L (ref 3.5–5.1)
Sodium: 139 mmol/L (ref 135–145)

## 2024-07-19 LAB — MAGNESIUM: Magnesium: 2 mg/dL (ref 1.7–2.4)

## 2024-07-19 MED ORDER — OXYCODONE HCL 5 MG PO TABS: 5.0000 mg | ORAL_TABLET | ORAL | Status: DC | PRN

## 2024-07-19 NOTE — Progress Notes (Addendum)
 " Progress Note   Patient: [name] FMW:[id number] DOB: 09-Feb-1951 DOA: 07/09/2024     9 DOS: the patient was seen and examined on 07/19/2024   Brief hospital course: [name] was admitted to the hospital with the working diagnosis of right lower extremity cellulitis complicated with septic shock.   73/M chronically ill, obesity class 3, heart failure, coronary artery disease, inclusion body myositis with baseline generalized weakness, unprovoked DVT, venous insufficiency with chronic bilateral lower extremity edema (lymphedema) and associated wounds followed by wound care clinic who presented to [facility] for evaluation of worsening lower extremity swelling and orthopnea.  12/11 patient was evaluated at the Cardiology office, found to be volume overloaded, and his diuretic regimen was increased. Unfortunately his symptoms did not improved, and patient was brought to the ED. On his initial physical examination his blood pressure was 105/51, HR 67, RR 22 and 02 saturation 100%  Lungs with decreased breath sounds at bases, with no wheezing, heart with S1 and S2 present and regular, with no gallops or rubs, abdomen with no distention, and positive lower extremity edema +++.    Na 140, K 4.8 Cl 101 bicarbonate 29, glucose 95 bun 44 cr 1,98  AST 104 ALT 35  BNP 909 Ck 1,802 High sensitive troponin 169 and 154  Wbc 6,3 hgb 11.2 plt 71  Urine analysis SG 1,017, protein 100, large hgb, large leukocytes, > 50 rbc, 11-20 wbc   Chest radiograph with hypoinflation, cardiomegaly, with right lower lobe atelectasis and small bilateral pleural effusions.   EKG 82 bpm, normal axis, qtc 471, right bundle branch block, sinus rhythm with 1st degree AV block, no significant ST segment or T wave changes.   Patient was placed on IV furosemide  for diuresis,   07/12/2024 - Moved to ICU for septic shock needing levophed . MAP 45, hypothermic with lethargy. Patient was placed on IV fluids and vasopressor  support with norepinephrine .  12/19 BP improved, pressors weaned off  Transferred from PCCM to [facility] on 12/20  12/25 transition to Cpap from Bipap to use at  night.  Clinically stable for possible transfer to SNF.   Assessment and Plan: * Cellulitis of right lower extremity Complicated with septic shock, now resolved.   Patient has been afebrile, Wbc  is 5.1   Has been on cefepime  since 12/22 and linezolid  since 12/20, no completed antibiotic therapy.   Continue local wound care.   Acute on chronic diastolic CHF (congestive heart failure) (HCC) Echocardiogram with preserved LV systolic function with EF 60 to 65%, mild LVH, EA fusion, RV not well visualized, LA and RA with mild dilatation, no significant valvular disease.   Systolic blood pressure 130 mmHg range.   Plan to continue atenolol  Hold on furosemide  for now.  Continue blood pressure monitoring   Coronary artery disease High sensitive troponin elevation due to sepsis, no acute coronary syndrome.  Continue blood pressure control, not on statin or antiplatelet therapy   HTN (hypertension) Continue blood pressure control with atenolol .   AKI (acute kidney injury) Hyperkalemia. Impending rhabdomyolysis (present on admission)   Today with stable renal function with serum cr at 0,63 with K at 4,5 and serum bicarbonate at 33 Na 139 and Mg 2.0  Continue to hold on diuretic therapy.   Inclusion body myositis (HCC) Patient not at baseline, continue very weak and deconditioned.   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  Patient was placed initially on Bipap support at night, will transition to Cpap and follow  up response.    Hyperlipidemia Continue to hold on rosuvastatin   Ck is down to 85.   Chronic radicular lumbar pain Continue as needed analgesics.  PT and OT   History of DVT (deep vein thrombosis) Lower extremity doppler US  was negative for left lower extremity deep venous thrombosis.   Continue anticoagulation with apixaban .    Obesity, class 3 (HCC) Calculated BMI is 44.4       Subjective: Patient continue very weak and deconditioned, no chest pain or dyspnea,   Physical Exam: Vitals:   07/18/24 1930 07/18/24 2318 07/19/24 0330 07/19/24 0748  BP: (!) 156/76 (!) 129/52 (!) 138/53 (!) 124/50  Pulse: 89 87 72   Resp: (!) 23 (!) 22 (!) 21 (!) 21  Temp: 98 F (36.7 C) 98.4 F (36.9 C) 97.9 F (36.6 C) 97.8 F (36.6 C)  TempSrc: Axillary Oral Oral Oral  SpO2: 100% 99% 92%   Weight:   (!) 141.1 kg   Height:       Neurology awake and alert, deconditioned ENT with mild pallor with no icterus Cardiovascular with S1 and S2 preset and regular with no gallops or rubs Respiratory with no wheezing or rhonchi, poor inspiratory effort, anterior auscultation  Abdomen soft and non tender Lower extremity edema + bilaterally    12/17 right leg.   Data Reviewed:    Family Communication: no family at the bedside   Disposition: Status is: Inpatient Remains inpatient appropriate because: recovering shock   Planned Discharge Destination: Skilled nursing facility     Author: Elidia Toribio Furnace, MD 07/19/2024 8:57 AM  For on call review [email or web address].  "

## 2024-07-19 NOTE — Plan of Care (Signed)
  Problem: Education: Goal: Knowledge of General Education information will improve Description: Including pain rating scale, medication(s)/side effects and non-pharmacologic comfort measures Outcome: Progressing   Problem: Health Behavior/Discharge Planning: Goal: Ability to manage health-related needs will improve Outcome: Progressing   Problem: Clinical Measurements: Goal: Ability to maintain clinical measurements within normal limits will improve Outcome: Progressing Goal: Will remain free from infection Outcome: Progressing Goal: Diagnostic test results will improve Outcome: Progressing Goal: Respiratory complications will improve Outcome: Progressing Goal: Cardiovascular complication will be avoided Outcome: Progressing   Problem: Activity: Goal: Risk for activity intolerance will decrease Outcome: Progressing   Problem: Nutrition: Goal: Adequate nutrition will be maintained Outcome: Progressing   Problem: Coping: Goal: Level of anxiety will decrease Outcome: Progressing   Problem: Elimination: Goal: Will not experience complications related to bowel motility Outcome: Progressing Goal: Will not experience complications related to urinary retention Outcome: Progressing   Problem: Pain Managment: Goal: General experience of comfort will improve and/or be controlled Outcome: Progressing   Problem: Safety: Goal: Ability to remain free from injury will improve Outcome: Progressing   Problem: Skin Integrity: Goal: Risk for impaired skin integrity will decrease Outcome: Progressing   Problem: Clinical Measurements: Goal: Ability to avoid or minimize complications of infection will improve Outcome: Progressing   Problem: Skin Integrity: Goal: Skin integrity will improve Outcome: Progressing   Problem: Education: Goal: Ability to demonstrate management of disease process will improve Outcome: Progressing Goal: Ability to verbalize understanding of medication  therapies will improve Outcome: Progressing Goal: Individualized Educational Video(s) Outcome: Progressing   Problem: Activity: Goal: Capacity to carry out activities will improve Outcome: Progressing   Problem: Cardiac: Goal: Ability to achieve and maintain adequate cardiopulmonary perfusion will improve Outcome: Progressing

## 2024-07-19 NOTE — TOC Progression Note (Signed)
 Transition of Care [facility]) - Progression Note    Patient Details  Name: [name] MRN: [id number] Date of Birth: 11/30/1950  Transition of Care [facility]) CM/SW Contact  Hartley KATHEE Robertson, [place] Phone Number: 07/19/2024, 12:11 PM  Clinical Narrative:     CSW spoke with pt at bedside, he requested CSW speak with his wife about bed offers. CSW spoke with pt's wife, she states she is going to drive by some of the SNF's today and should have a choice soon, will continue to follow.   Expected Discharge Plan: Skilled Nursing Facility Barriers to Discharge: SNF Pending bed offer               Expected Discharge Plan and Services In-house Referral: NA Discharge Planning Services: CM Consult Post Acute Care Choice: Home Health Living arrangements for the past 2 months: Skilled Nursing Facility                   DME Agency: NA                   Social Drivers of Health (SDOH) Interventions SDOH Screenings   Food Insecurity: No Food Insecurity (07/10/2024)  Housing: Low Risk (07/10/2024)  Transportation Needs: No Transportation Needs (07/10/2024)  Utilities: Not At Risk (07/10/2024)  Alcohol Screen: Low Risk (06/05/2024)  Depression (PHQ2-9): Low Risk (12/28/2023)  Financial Resource Strain: Low Risk (06/05/2024)  Physical Activity: Inactive (06/05/2024)  Social Connections: Socially Isolated (07/10/2024)  Stress: No Stress Concern Present (06/05/2024)  Tobacco Use: Medium Risk (07/09/2024)  Health Literacy: Adequate Health Literacy (12/28/2023)    Readmission Risk Interventions     No data to display

## 2024-07-19 NOTE — Plan of Care (Signed)
  Problem: Clinical Measurements: Goal: Respiratory complications will improve Outcome: Progressing Goal: Cardiovascular complication will be avoided Outcome: Progressing   Problem: Activity: Goal: Risk for activity intolerance will decrease Outcome: Progressing   Problem: Coping: Goal: Level of anxiety will decrease Outcome: Progressing   Problem: Pain Managment: Goal: General experience of comfort will improve and/or be controlled Outcome: Progressing   Problem: Safety: Goal: Ability to remain free from injury will improve Outcome: Progressing

## 2024-07-20 ENCOUNTER — Other Ambulatory Visit: Payer: Self-pay

## 2024-07-20 ENCOUNTER — Other Ambulatory Visit (HOSPITAL_BASED_OUTPATIENT_CLINIC_OR_DEPARTMENT_OTHER): Payer: Self-pay

## 2024-07-20 ENCOUNTER — Encounter (HOSPITAL_BASED_OUTPATIENT_CLINIC_OR_DEPARTMENT_OTHER): Admitting: General Surgery

## 2024-07-20 DIAGNOSIS — E66813 Obesity, class 3: Secondary | ICD-10-CM | POA: Diagnosis not present

## 2024-07-20 DIAGNOSIS — I2510 Atherosclerotic heart disease of native coronary artery without angina pectoris: Secondary | ICD-10-CM | POA: Diagnosis not present

## 2024-07-20 DIAGNOSIS — G7241 Inclusion body myositis [IBM]: Secondary | ICD-10-CM | POA: Diagnosis not present

## 2024-07-20 DIAGNOSIS — N179 Acute kidney failure, unspecified: Secondary | ICD-10-CM | POA: Diagnosis not present

## 2024-07-20 DIAGNOSIS — I10 Essential (primary) hypertension: Secondary | ICD-10-CM | POA: Diagnosis not present

## 2024-07-20 DIAGNOSIS — G8929 Other chronic pain: Secondary | ICD-10-CM | POA: Diagnosis not present

## 2024-07-20 DIAGNOSIS — L03115 Cellulitis of right lower limb: Secondary | ICD-10-CM | POA: Diagnosis not present

## 2024-07-20 DIAGNOSIS — I5033 Acute on chronic diastolic (congestive) heart failure: Secondary | ICD-10-CM | POA: Diagnosis not present

## 2024-07-20 DIAGNOSIS — Z86718 Personal history of other venous thrombosis and embolism: Secondary | ICD-10-CM | POA: Diagnosis not present

## 2024-07-20 DIAGNOSIS — M5416 Radiculopathy, lumbar region: Secondary | ICD-10-CM | POA: Diagnosis not present

## 2024-07-20 DIAGNOSIS — E785 Hyperlipidemia, unspecified: Secondary | ICD-10-CM | POA: Diagnosis not present

## 2024-07-20 LAB — BASIC METABOLIC PANEL WITH GFR
Anion gap: 4 — ABNORMAL LOW (ref 5–15)
BUN: 23 mg/dL (ref 8–23)
CO2: 39 mmol/L — ABNORMAL HIGH (ref 22–32)
Calcium: 9.3 mg/dL (ref 8.9–10.3)
Chloride: 98 mmol/L (ref 98–111)
Creatinine, Ser: 0.64 mg/dL (ref 0.61–1.24)
GFR, Estimated: >60 mL/min
Glucose, Bld: 87 mg/dL (ref 70–99)
Potassium: 4.6 mmol/L (ref 3.5–5.1)
Sodium: 141 mmol/L (ref 135–145)

## 2024-07-20 MED ORDER — ACETAMINOPHEN 325 MG PO TABS: 650.0000 mg | ORAL_TABLET | Freq: Four times a day (QID) | ORAL | Status: AC | PRN

## 2024-07-20 MED ORDER — ADULT MULTIVITAMIN W/MINERALS CH
1.0000 | ORAL_TABLET | Freq: Every day | ORAL | 0 refills | Status: AC
  Filled 2024-07-20: qty 30, 30d supply, fill #0

## 2024-07-20 MED ORDER — TORSEMIDE 20 MG PO TABS: 20.0000 mg | ORAL_TABLET | Freq: Every day | ORAL | Status: DC

## 2024-07-20 MED ORDER — POLYETHYLENE GLYCOL 3350 17 G PO PACK
17.0000 g | PACK | Freq: Every day | ORAL | 0 refills | Status: DC
  Filled 2024-07-20: qty 14, 14d supply, fill #0

## 2024-07-20 MED ORDER — TORSEMIDE 20 MG PO TABS
20.0000 mg | ORAL_TABLET | Freq: Every day | ORAL | 0 refills | Status: DC
  Filled 2024-07-20: qty 30, 30d supply, fill #0

## 2024-07-20 MED ORDER — JUVEN PO PACK
1.0000 | PACK | Freq: Two times a day (BID) | ORAL | 0 refills | Status: DC
  Filled 2024-07-20: qty 60, 30d supply, fill #0

## 2024-07-20 MED ORDER — ENSURE PLUS HIGH PROTEIN PO LIQD
237.0000 mL | Freq: Three times a day (TID) | ORAL | 0 refills | Status: DC
  Filled 2024-07-20: qty 21330, 30d supply, fill #0

## 2024-07-20 MED ORDER — ATENOLOL 25 MG PO TABS
25.0000 mg | ORAL_TABLET | Freq: Every day | ORAL | 0 refills | Status: DC
  Filled 2024-07-20: qty 30, 30d supply, fill #0

## 2024-07-20 NOTE — Discharge Instructions (Signed)

## 2024-07-20 NOTE — Progress Notes (Signed)
 AVS printed and placed in packet for discharge.

## 2024-07-20 NOTE — Progress Notes (Signed)
 Last documented bipap settings for QHS per child psychotherapist.     07/20/24 0432  BiPAP/CPAP/SIPAP  BiPAP/CPAP/SIPAP Pt Type Adult  BiPAP/CPAP/SIPAP SERVO  Mask Type Full face mask  Mask Size Large  Set Rate 15 breaths/min  Respiratory Rate 18 breaths/min  IPAP 16 cmH20  EPAP 8 cmH2O  PEEP 8 cmH20  FiO2 (%) 35 %

## 2024-07-20 NOTE — Discharge Summary (Signed)
 " Physician Discharge Summary   Patient: [name] MRN: [id number] DOB: 08/29/1950  Admit date:     07/09/2024  Discharge date: 07/20/2024  Discharge Physician: Elidia Sieving Alianny Toelle   PCP: Candise Aleene DEL, MD   Recommendations at discharge:    Patient needs to continue Bipap at night for sleep. Continue diuresis with torsemide  20 mg po daily, note that his edema is mostly lymphedema.  Atenolol  decreased dose to avoid hypotension Follow up renal function and electrolytes in 7 days as outpatient  Follow up with Dr Candise in 7 to 10 days   I spoke with patient's wife over the phone, we talked in detail about patient's condition, plan of care and prognosis and all questions were addressed.  Discharge Diagnoses: Principal Problem:   Cellulitis of right lower extremity Active Problems:   Acute on chronic diastolic CHF (congestive heart failure) (HCC)   Coronary artery disease   HTN (hypertension)   AKI (acute kidney injury)   Inclusion body myositis (HCC)   Hyperlipidemia   Chronic radicular lumbar pain   History of DVT (deep vein thrombosis)   Obesity, class 3 (HCC)  Resolved Problems:   * No resolved hospital problems. [facility] Course: [name] was admitted to the hospital with the working diagnosis of right lower extremity cellulitis complicated with septic shock.   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  12/11 patient was evaluated at the Cardiology office, found to be volume overloaded, and his diuretic regimen was increased. Unfortunately his symptoms did not improved, and patient was brought to the ED. On his initial physical  examination his blood pressure was 105/51, HR 67, RR 22 and 02 saturation 100%  Lungs with decreased breath sounds at bases, with no wheezing, heart with S1 and S2 present and regular, with no gallops or rubs, abdomen with no distention, and positive lower extremity edema +++.    Na 140, K 4.8 Cl 101 bicarbonate 29, glucose 95 bun 44 cr 1,98  AST 104 ALT 35  BNP 909 Ck 1,802 High sensitive troponin 169 and 154  Wbc 6,3 hgb 11.2 plt 71  Urine analysis SG 1,017, protein 100, large hgb, large leukocytes, > 50 rbc, 11-20 wbc   Chest radiograph with hypoinflation, cardiomegaly, with right lower lobe atelectasis and small bilateral pleural effusions.   EKG 82 bpm, normal axis, qtc 471, right bundle branch block, sinus rhythm with 1st degree AV block, no significant ST segment or T wave changes.   Renal CT with no hydronephrosis or obstructing urinary tract calculus. Bilateral non obstructing renal calculi No acute findings in the abdomen or pelvis.   Patient was placed on IV furosemide  for diuresis,   07/12/2024 - Moved to ICU for septic shock needing levophed . MAP 45, hypothermic with lethargy. Patient was placed on IV fluids and vasopressor support with norepinephrine .  CT chest with prominent elevation of the right hemidiaphragm, complete right middle lobe and near complete right lower lobe atelectasis. Subsegmental left lung atelectasis. Dilated main pulmonary artery. Dilatated ascending thoracic aorta 4.1 cm, follow up with CT angiogram in 12 months is advised.  CT right leg with marked skin thickening and subcutaneous edema throughout the lower extremity from the knee through the imaged portions  of the ankle and  hindfoot, without evidence of abscess or soft tissue gas. Extensive venous stasis soft tissue calcifications throughout the superficial soft tissues of the lower extremity.   12/19 BP improved, pressors weaned off  Transferred from [facility] to [facility] on 12/20  12/25 Clinically stable  for possible transfer to SNF.  12/26 discontinue foley in preparation for discharge to SNF. Continue Bipap support at night and daily loop diuretic.    Assessment and Plan: * Cellulitis of right lower extremity Complicated with septic shock, now resolved.   Patient has been afebrile, Wbc  is 5.1   Patient was treated with broad spectrum antibiotic therapy with linezolid  and cefepime  with good toleration.   Continue local wound care.   Acute on chronic diastolic CHF (congestive heart failure) (HCC) Echocardiogram with preserved LV systolic function with EF 60 to 65%, mild LVH, EA fusion, RV not well visualized, LA and RA with mild dilatation, no significant valvular disease.   Patient was placed on furosemide  for diuresis and received vasopressor therapy with norepinephrine , negative fluid balance was achieved, - 10,097 ml, with significant improvement in his symptoms.   Plan to continue atenolol  Continue loop diuretic therapy and follow up as outpatient.  Likely great component of edema is due to lymphedema, caution with diuresis is advised.   Coronary artery disease High sensitive troponin elevation due to sepsis, no acute coronary syndrome.  Continue blood pressure control, not on statin or antiplatelet therapy   HTN (hypertension) Continue blood pressure control with atenolol .   AKI (acute kidney injury) Hyperkalemia. Impending rhabdomyolysis (present on admission)   Renal function at the time of discharge with serum cr at 0,64 with K at 4.6 and serum bicarbonate at 23 Na 141 Mg 2.0   Plan to continue diuresis with torsemide  and plan to follow up renal function and electrolytes as outpatient in 7 days   Inclusion body myositis (HCC) Patient not at baseline, continue very weak and deconditioned.   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  Patient has been placed on bipap, and plan to continue outpatient non invasive mechanical ventilation at night.   Needs aggressive physical therapy.     Hyperlipidemia Continue to hold on rosuvastatin   Ck is down to 85.   Chronic radicular lumbar pain Continue as needed analgesics.  PT and OT   History of DVT (deep vein thrombosis) Lower extremity doppler US  was negative for left lower extremity deep venous thrombosis.  Continue anticoagulation with apixaban .    Obesity, class 3 (HCC) Calculated BMI is 44.4        Consultants: Cardiology  Procedures performed: none   Disposition: Skilled nursing facility Diet recommendation:  Cardiac diet DISCHARGE MEDICATION: Allergies as of 07/20/2024   No Known Allergies      Medication List     STOP taking these medications    Eszopiclone  3 MG Tabs Commonly known as: eszopiclone    ibuprofen 800 MG tablet Commonly known as: ADVIL   levocetirizine 5 MG tablet Commonly known as: Xyzal  Allergy 24HR   levofloxacin  750 MG tablet Commonly known as: LEVAQUIN    lisinopril  10 MG tablet Commonly known as: ZESTRIL    potassium chloride  10 MEQ tablet Commonly known as: KLOR-CON    rosuvastatin  5 MG tablet Commonly known as: CRESTOR        TAKE these medications    acetaminophen  325 MG tablet Commonly known as: TYLENOL  Take 2 tablets (650 mg total) by mouth every 6 (six) hours as needed (as needed for pain). What  changed:  medication strength how much to take when to take this reasons to take this   atenolol  25 MG tablet Commonly known as: TENORMIN  Take 1 tablet (25 mg total) by mouth daily. Start taking on: July 21, 2024 What changed: when to take this   b complex vitamins tablet Take 1 tablet by mouth every evening.   COQ-10 PO Take 1 capsule by mouth every evening.   Eliquis  2.5 MG Tabs tablet Generic drug: apixaban  Take 1 tablet (2.5 mg total) by mouth 2 (two) times daily.   ezetimibe  10 MG tablet Commonly known as: ZETIA  Take 1 tablet (10 mg total) by mouth daily. What changed: when to take this   FOLIC  ACID PO Take 1 tablet by mouth daily.   KRILL OIL PO Take 1 capsule by mouth daily.   multivitamin with minerals Tabs tablet Take 1 tablet by mouth daily. Start taking on: July 21, 2024   nutrition supplement (JUVEN) Pack Take 1 packet by mouth 2 (two) times daily between meals.   feeding supplement Liqd Take 237 mLs by mouth 3 (three) times daily between meals.   polyethylene glycol 17 g packet Commonly known as: MIRALAX  / GLYCOLAX  Take 17 g by mouth daily. Start taking on: July 21, 2024   tamsulosin  0.4 MG Caps capsule Commonly known as: FLOMAX  Take 1 capsule (0.4 mg total) by mouth daily. What changed: when to take this   torsemide  20 MG tablet Commonly known as: DEMADEX  Take 1 tablet (20 mg total) by mouth daily. Start taking on: July 21, 2024 What changed: how much to take   VITAMIN D-3 PO Take 1 capsule by mouth daily.               Discharge Care Instructions  (From admission, onward)           Start     Ordered   07/20/24 0000  Discharge wound care:       Comments: Wound care  Daily      Comments: Apply Xeroform gauze to bilat buttocks Q day, then cover with foam dressing.  Change foam dressing Q 3 days or PRN soiling     Wound care  Daily      Comments: Change dressing to right leg Q TUES as follows: Apply a piece of Aquacel Soila # [phone]) to right leg wounds (3 places), then cover with kerlex and ace wrap, beginning just behind toes to below knees.  Moisten previous dressings with NS to assist with removal each time.   07/20/24 1300            Contact information for after-discharge care     Destination     [facility]   Service: Skilled Nursing Contact information: [address] [place]  [address] [phone]                    Discharge Exam: Filed Weights   07/18/24 0325 07/19/24 0330 07/20/24 0405  Weight: (!) 140.4 kg (!) 141.1 kg (!) 137.5 kg   BP (!) 123/56 (BP Location:  Right Arm)   Pulse 80   Temp 97.7 F (36.5 C) (Oral)   Resp 17   Ht 5' 10 (1.778 m)   Wt (!) 137.5 kg   SpO2 100%   BMI 43.49 kg/m   Patient is feeling better, has no chest pain or dyspnea, tolerating po well, no nausea or vomiting, and tolerating Bipap at night.   Neurology awake  and alert, deconditioned ENT with mild pallor with no icterus Cardiovascular with S1 and S2 present and regular with no gallops, rubs or murmurs Respiratory with decreased breath sounds at bases with no wheezing or rhonchi Abdomen protuberant, soft and non tender Lower extremity with positive lymphedema.    Condition at discharge: stable  The results of significant diagnostics from this hospitalization (including imaging, microbiology, ancillary and laboratory) are listed below for reference.   Imaging Studies: DG CHEST PORT 1 VIEW Result Date: 07/17/2024 EXAM: 1 VIEW(S) XRAY OF THE CHEST 07/17/2024 08:26:51 AM COMPARISON: CT chest 07/12/2024 and earlier. CLINICAL HISTORY: 73 year old male with hypoxia. FINDINGS: LINES, TUBES AND DEVICES: Cervical spine hardware partially included in field of view. LUNGS AND PLEURA: Continued low lung volume superimposed on chronic elevation of the right hemidiaphragm. Plate-like right lung base atelectasis also regressed. Left lung base ventilation appears improved. No areas of worsening ventilation. No pneumothorax. HEART AND MEDIASTINUM: Stable cardiomegaly. BONES AND SOFT TISSUES: No acute osseous abnormality. IMPRESSION: 1. Low lung volumes with chronic elevation of the right hemidiaphragm. Improving basilar atelectasis, no worsening ventilation. Electronically signed by: Helayne Hurst MD 07/17/2024 09:15 AM EST RP Workstation: [id number]   CT TIBIA FIBULA RIGHT WO CONTRAST Result Date: 07/14/2024 EXAM: CT RIGHT LOWER EXTREMITY, WITHOUT IV CONTRAST 07/12/2024 10:18:45 PM TECHNIQUE: Axial images were acquired through the right lower extremity without IV contrast.  Reformatted images were reviewed. Automated exposure control, iterative reconstruction, and/or weight based adjustment of the mA/kV was utilized to reduce the radiation dose to as low as reasonably achievable. COMPARISON: None available. CLINICAL HISTORY: Concern for soft tissue infection. Respiratory illness. FINDINGS: BONES AND JOINTS: No acute fracture or focal osseous lesion. No dislocation. No focal bone erosions identified to indicate osteomyelitis. There is a suprapatellar joint effusion. The joint spaces are otherwise normal. SOFT TISSUES: Extensive venous stasis soft tissue calcifications are identified throughout the superficial soft tissues of the lower extremity. Marked skin thickening and subcutaneous edema are noted throughout the soft tissues of the lower extremity from the knee through the imaged portions of the ankle and hindfoot. There are no signs of soft tissue gas or focal loculated fluid collection to indicate an abscess. The muscles of the right lower extremity are diffusely atrophic with diffuse fatty replacement. IMPRESSION: 1. Marked skin thickening and subcutaneous edema throughout the lower extremity from the knee through the imaged portions of the ankle and hindfoot, without evidence of abscess or soft tissue gas. Given the clinical history of concern for soft tissue infection, cellulitis or erysipelas is a primary consideration. These conditions present with skin thickening, edema, and erythema. However, the absence of soft tissue gas or focal loculated fluid collection (abscess) makes a severe, complicated infection less likely, but does not exclude uncomplicated cellulitis. 2. Suprapatellar joint effusion. 3. Extensive venous stasis soft tissue calcifications throughout the superficial soft tissues of the lower extremity. These changes are most likely secondary to chronic inflammatory changes due to chronic venous insufficiency. Electronically signed by: Waddell Calk MD 07/14/2024  11:45 AM EST RP Workstation: [id number]   CT CHEST WO CONTRAST Result Date: 07/13/2024 EXAM: CHEST CT WITHOUT CONTRAST 07/12/2024 10:18:45 PM TECHNIQUE: CT of the chest was performed without the administration of intravenous contrast. Multiplanar reformatted images are provided for review. Automated exposure control, iterative reconstruction, and/or weight based adjustment of the mA/kV was utilized to reduce the radiation dose to as low as reasonably achievable. COMPARISON: 06/29/23 high resolution chest CT. Chest radiograph from earlier today. CLINICAL HISTORY: Respiratory illness, nondiagnostic  xray. FINDINGS: MEDIASTINUM: Three vessel coronary atherosclerosis. Atherosclerotic thoracic aorta with dilated 4.1 cm ascending thoracic aorta. Dilated main pulmonary artery (4.2 cm diameter). The central airways are clear. LYMPH NODES: No mediastinal, hilar or axillary lymphadenopathy. LUNGS AND PLEURA: No pleural effusion or pneumothorax. Stable subcentimeter calcified left lower lobe granulomas. No lung masses or significant pulmonary nodules. Chronic prominent elevation of the right hemidiaphragm. Complete right middle lobe and near complete right lower lobe atelectasis. Subsegmental left lung base atelectasis. Chronic mild patchy peripheral reticulation in the right greater than left lungs is unchanged. SOFT TISSUES AND BONES: Marked thoracic spondylosis. No acute abnormality of the soft tissues. UPPER ABDOMEN: Limited images of the upper abdomen demonstrate a nonobstructing 9 mm upper right renal stone. IMPRESSION: 1. Chronic prominent elevation of the right hemidiaphragm. Complete right middle lobe and near complete right lower lobe atelectasis. 2. Subsegmental left lung base atelectasis. 3. Chronic mild patchy peripheral reticulation in the right greater than left lungs, unchanged. 4. Dilated main pulmonary artery, suggesting pulmonary arterial hypertension. 5. Dilated 4.1 cm ascending thoracic aorta. Follow-up  chest CT angiogram advised in 12 months. 6. Three vessel coronary atherosclerosis. 7. Nonobstructing 9 mm upper right renal stone. 8. Aortic Atherosclerosis (ICD10-I70.0). 9. Aortic aneurysm NOS (ICD10-I71.9). Electronically signed by: Selinda Blue MD 07/13/2024 07:20 AM EST RP Workstation: [id number]   DG CHEST PORT 1 VIEW Result Date: 07/12/2024 EXAM: 1 VIEW(S) XRAY OF THE CHEST 07/12/2024 09:50:00 AM COMPARISON: 07/09/2024 CLINICAL HISTORY: Shortness of breath FINDINGS: LUNGS AND PLEURA: Low lung volumes. Elevation of the right hemidiaphragm is noted. Increasing right basilar atelectasis is noted. No pleural effusion. No pneumothorax. HEART AND MEDIASTINUM: Aortic atherosclerosis. No acute abnormality of the cardiac and mediastinal silhouettes. BONES AND SOFT TISSUES: Cervical fusion hardware, partially imaged. No acute osseous abnormality. IMPRESSION: 1. Increasing right basilar atelectasis. Electronically signed by: Oneil Devonshire MD 07/12/2024 07:49 PM EST RP Workstation: [id number]   CT RENAL STONE STUDY Result Date: 07/11/2024 EXAM: CT ABDOMEN AND PELVIS WITHOUT CONTRAST 07/10/2024 02:06:00 PM TECHNIQUE: CT of the abdomen and pelvis was performed without the administration of intravenous contrast. Multiplanar reformatted images are provided for review. Automated exposure control, iterative reconstruction, and/or weight-based adjustment of the mA/kV was utilized to reduce the radiation dose to as low as reasonably achievable. COMPARISON: None available. CLINICAL HISTORY: hydronephrosis hydronephrosis FINDINGS: LOWER CHEST: Band of atelectasis with associated calcifications in the right lower lobe, consistent with benign postinflammatory changes. LIVER: Small subcapsular fluid attenuation and a 1 cm cyst within the liver. GALLBLADDER AND BILE DUCTS: Gallbladder is unremarkable. No biliary ductal dilatation. SPLEEN: No acute abnormality. PANCREAS: No acute abnormality. ADRENAL GLANDS: No acute abnormality.  KIDNEYS, URETERS AND BLADDER: Bilateral nonobstructing renal calculi. No renal calculus. No bladder calculi. No hydronephrosis. No perinephric or periureteral stranding. Urinary bladder is unremarkable. GI AND BOWEL: Stomach demonstrates no acute abnormality. Normal appendix. There is no bowel obstruction. PERITONEUM AND RETROPERITONEUM: No ascites. No free air. VASCULATURE: Atherosclerotic calcification of the abdominal aorta. LYMPH NODES: No lymphadenopathy. REPRODUCTIVE ORGANS: No acute abnormality. BONES AND SOFT TISSUES: Right hip prosthetic. No acute osseous abnormality. No focal soft tissue abnormality. IMPRESSION: 1. No hydronephrosis or obstructing urinary tract calculus. 2. Bilateral nonobstructing renal calculi. 3. No acute findings in the abdomen or pelvis. Electronically signed by: Norleen Boxer MD 07/11/2024 10:05 AM EST RP Workstation: [id number]   ECHOCARDIOGRAM COMPLETE Result Date: 07/10/2024    ECHOCARDIOGRAM REPORT   Patient Name:   [name] Date of Exam: 07/10/2024 Medical Rec #:  [id number]  Height:       70.0 in Accession #:    [id number]  Weight:       300.0 lb Date of Birth:  January 06, 1951   BSA:          2.480 m Patient Age:    73 years    BP:           77/39 mmHg Patient Gender: M           HR:           100 bpm. Exam Location:  Inpatient Procedure: 2D Echo, Cardiac Doppler and Color Doppler (Both Spectral and Color            Flow Doppler were utilized during procedure). Indications:    CHF - Acute Diastolic  History:        Patient has prior history of Echocardiogram examinations, most                 recent 05/30/2023. CHF, CAD, Signs/Symptoms:Edema; Risk                 Factors:Dyslipidemia, Hypertension and Former Smoker.  Sonographer:    Juliene Rucks Referring Phys: [id number] KELLY A GRIFFITH  Sonographer Comments: Technically difficult study due to poor echo windows, no subcostal window and patient is obese. Image acquisition challenging due to patient body habitus. IMPRESSIONS  1.  Left ventricular ejection fraction, by estimation, is 60 to 65%. The left ventricle has normal function. Left ventricular endocardial border not optimally defined to evaluate regional wall motion. There is mild concentric left ventricular hypertrophy. Indeterminate diastolic filling due to E-A fusion.  2. Right ventricular systolic function is normal. The right ventricular size is not well visualized. Tricuspid regurgitation signal is inadequate for assessing PA pressure.  3. Left atrial size was mildly dilated.  4. Right atrial size was mildly dilated.  5. The mitral valve is normal in structure. No evidence of mitral valve regurgitation. No evidence of mitral stenosis. Moderate mitral annular calcification.  6. The aortic valve was not well visualized. Aortic valve regurgitation is not visualized. No aortic stenosis is present.  7. Aortic dilatation noted. There is borderline dilatation of the aortic root, measuring 39 mm. FINDINGS  Left Ventricle: Left ventricular ejection fraction, by estimation, is 60 to 65%. The left ventricle has normal function. Left ventricular endocardial border not optimally defined to evaluate regional wall motion. The left ventricular internal cavity size was normal in size. There is mild concentric left ventricular hypertrophy. Indeterminate diastolic filling due to E-A fusion. Right Ventricle: The right ventricular size is not well visualized. Right vetricular wall thickness was not well visualized. Right ventricular systolic function is normal. Tricuspid regurgitation signal is inadequate for assessing PA pressure. Left Atrium: Left atrial size was mildly dilated. Right Atrium: Right atrial size was mildly dilated. Pericardium: There is no evidence of pericardial effusion. Mitral Valve: The mitral valve is normal in structure. Moderate mitral annular calcification. No evidence of mitral valve regurgitation. No evidence of mitral valve stenosis. Tricuspid Valve: The tricuspid valve is  not well visualized. Tricuspid valve regurgitation is not demonstrated. Aortic Valve: The aortic valve was not well visualized. Aortic valve regurgitation is not visualized. No aortic stenosis is present. Pulmonic Valve: The pulmonic valve was not well visualized. Pulmonic valve regurgitation is not visualized. No evidence of pulmonic stenosis. Aorta: Aortic dilatation noted. There is borderline dilatation of the aortic root, measuring 39 mm. IAS/Shunts: The interatrial septum was not well visualized.  LEFT VENTRICLE  PLAX 2D LVIDd:         4.90 cm LVIDs:         3.30 cm LV PW:         1.20 cm LV IVS:        1.30 cm LVOT diam:     2.30 cm LV SV:         70 LV SV Index:   28 LVOT Area:     4.15 cm  RIGHT VENTRICLE RV S prime:     14.50 cm/s TAPSE (M-mode): 1.8 cm LEFT ATRIUM             Index        RIGHT ATRIUM           Index LA diam:        3.50 cm 1.41 cm/m   RA Area:     23.38 cm 9.43 cm/m LA Vol (A2C):   70.5 ml 28.43 ml/m  RA Volume:   59.12 ml  23.84 ml/m LA Vol (A4C):   62.6 ml 25.24 ml/m LA Biplane Vol: 67.5 ml 27.22 ml/m  AORTIC VALVE LVOT Vmax:   100.00 cm/s LVOT Vmean:  66.200 cm/s LVOT VTI:    0.169 m  AORTA Ao Root diam: 3.90 cm  SHUNTS Systemic VTI:  0.17 m Systemic Diam: 2.30 cm Jerel Croitoru MD Electronically signed by Jerel Balding MD Signature Date/Time: 07/10/2024/5:24:30 PM    Final    US  Venous Img Lower Right (DVT Study) Addendum Date: 07/10/2024 ADDENDUM REPORT: 07/10/2024 13:04 ADDENDUM: There is a LEFT/RIGHT error in the report. The RIGHT lower extremity was scanned and not the left. The worksheet was mistakenly filled out as a left lower extremity, resulting in the LEFT/RIGHT transposition. The right lower extremity is negative for deep venous thrombosis. Electronically Signed   By: Marcey Moan M.D.   On: 07/10/2024 13:04   Result Date: 07/10/2024 CLINICAL DATA:  Left lower extremity edema. EXAM: LEFT LOWER EXTREMITY VENOUS DOPPLER ULTRASOUND TECHNIQUE: Gray-scale  sonography with graded compression, as well as color Doppler and duplex ultrasound were performed to evaluate the lower extremity deep venous systems from the level of the common femoral vein and including the common femoral, femoral, profunda femoral, popliteal and calf veins including the posterior tibial, peroneal and gastrocnemius veins when visible. The superficial great saphenous vein was also interrogated. Spectral Doppler was utilized to evaluate flow at rest and with distal augmentation maneuvers in the common femoral, femoral and popliteal veins. COMPARISON:  None Available. FINDINGS: Contralateral Common Femoral Vein: Respiratory phasicity is normal and symmetric with the symptomatic side. No evidence of thrombus. Normal compressibility. Common Femoral Vein: No evidence of thrombus. Normal compressibility, respiratory phasicity and response to augmentation. Saphenofemoral Junction: No evidence of thrombus. Normal compressibility and flow on color Doppler imaging. Profunda Femoral Vein: No evidence of thrombus. Normal compressibility and flow on color Doppler imaging. Femoral Vein: No evidence of thrombus. Normal compressibility, respiratory phasicity and response to augmentation. Popliteal Vein: No evidence of thrombus. Normal compressibility, respiratory phasicity and response to augmentation. Calf Veins: No evidence of thrombus. Normal compressibility and flow on color Doppler imaging. Superficial Great Saphenous Vein: No evidence of thrombus. Normal compressibility. Venous Reflux:  None. Other Findings: No evidence of superficial thrombophlebitis or abnormal fluid collection. IMPRESSION: No evidence of left lower extremity deep venous thrombosis. Electronically Signed: By: Marcey Moan M.D. On: 07/09/2024 15:19   US  RENAL Result Date: 07/09/2024 EXAM: US  Retroperitoneum Complete, Renal. 07/09/2024 08:45:45 PM TECHNIQUE: Real-time  ultrasonography of the kidneys was performed. COMPARISON: None  available CLINICAL HISTORY: 409830 AKI (acute kidney injury) 409830 AKI (acute kidney injury) FINDINGS: FINDINGS: RIGHT KIDNEY/URETER: Right kidney measures 12.0 x 6.3 x 3.6 cm. Calculated volume is 142 ml. New right-sided hydronephrosis is noted of a moderate degree. Normal cortical echogenicity. No calculus. No mass. LEFT KIDNEY/URETER: Left kidney measures 11.1 x 5.9 x 4.3 cm. The calculated volume is 147 ml. Normal cortical echogenicity. No hydronephrosis. No calculus. No mass. BLADDER: There is suggestion of an echogenic lesion in the anterior aspect of the bladder although incompletely evaluated on this exam. CT of the abdomen and pelvis may be helpful for further evaluation with delayed images through the bladder to assess for enhancement. IMPRESSION: 1. New moderate right-sided hydronephrosis, which may account for acute kidney injury in the appropriate clinical setting. 2. Echogenic lesion in the anterior bladder, incompletely evaluated; recommend CT abdomen and pelvis with delayed bladder imaging for further characterization. Electronically signed by: Oneil Devonshire MD 07/09/2024 08:56 PM EST RP Workstation: [id number]   DG Chest Portable 1 View Result Date: 07/09/2024 EXAM: 1 VIEW(S) XRAY OF THE CHEST 07/09/2024 07:08:00 PM COMPARISON: 07/09/2024 CLINICAL HISTORY: SOB - worsening FINDINGS: LUNGS AND PLEURA: Chronic elevation of right hemidiaphragm. Diminished lung volumes, particularly on right side. Persistent hazy opacity at right lung base, may represent atelectasis or pneumonia. No pleural effusion. No pneumothorax. HEART AND MEDIASTINUM: No acute abnormality of the cardiac and mediastinal silhouettes. BONES AND SOFT TISSUES: No acute osseous abnormality. IMPRESSION: 1. Persistent hazy opacity at the right lung base concerning for pneumonia versus atelectasis. 2. Chronic elevation of the right hemidiaphragm with diminished right-sided lung volumes. Electronically signed by: Greig Pique MD 07/09/2024  07:25 PM EST RP Workstation: [id number]   DG Chest Portable 1 View Result Date: 07/09/2024 EXAM: 1 VIEW(S) XRAY OF THE CHEST 07/09/2024 12:40:20 PM COMPARISON: CT chest dated 06/29/2023. CLINICAL HISTORY: Shortness of breath with increased leg swelling and weakness. FINDINGS: LUNGS AND PLEURA: Shallow inspiration. Elevation of the right hemidiaphragm with interposition of bowel contents under the hemidiaphragm. Atelectasis in the right lung base with probable small pleural effusion. No pneumothorax. HEART AND MEDIASTINUM: Heart size is normal given the technique. No acute abnormality of the cardiac and mediastinal silhouettes. BONES AND SOFT TISSUES: Degenerative changes in the spine. IMPRESSION: 1. Atelectasis in the right lung base with probable small pleural effusion. 2. Elevation of the right hemidiaphragm with interposed bowel beneath the hemidiaphragm. Electronically signed by: Elsie Gravely MD 07/09/2024 01:15 PM EST RP Workstation: [id number]    Microbiology: Results for orders placed or performed during the hospital encounter of 07/09/24  Culture, blood (routine x 2)     Status: None   Collection Time: 07/09/24  2:40 PM   Specimen: BLOOD  Result Value Ref Range Status   Specimen Description   Final    BLOOD LEFT ANTECUBITAL Performed at [facility], [address] [place], [place] [address]    Special Requests   Final    BOTTLES DRAWN AEROBIC AND ANAEROBIC Blood Culture adequate volume Performed at [facility], [address], [place], [place] [address]    Culture   Final    NO GROWTH 5 DAYS Performed at [facility], [address] [place], [place] [address]    Report Status 07/14/2024 FINAL  Final  MRSA Next Gen by PCR, Nasal     Status: Abnormal   Collection Time: 07/12/24  5:09 PM   Specimen: Nasal Mucosa; Nasal Swab  Result Value Ref Range Status   MRSA by PCR Next Gen DETECTED (A) NOT DETECTED Final    Comment: RESULT  CALLED TO, READ BACK BY AND VERIFIED WITH: RN DOROTHA KU [phone] @ [phone] (NOTE) The GeneXpert MRSA Assay (FDA approved for NASAL specimens only), is one component of a comprehensive MRSA colonization surveillance program. It is not intended to diagnose MRSA infection nor to guide or monitor treatment for MRSA infections. Test performance is not FDA approved in patients less than 79 years old. Performed at [facility], [address], [place], [place] [address]     Labs: CBC: Recent Labs  Lab 07/15/24 0259 07/16/24 0530 07/17/24 0458 07/18/24 0441 07/19/24 0447  WBC 7.0 7.0 7.6 5.6 5.1  NEUTROABS 5.5 5.0 5.4  --   --   HGB 9.6* 9.5* 10.0* 9.2* 8.9*  HCT 30.3* 30.1* 32.2* 29.1* 27.2*  MCV 111.4* 112.3* 114.2* 111.5* 108.4*  PLT 64* 62* 64* 53* 50*   Basic Metabolic Panel: Recent Labs  Lab 07/15/24 0259 07/16/24 0530 07/17/24 0005 07/17/24 0458 07/18/24 0441 07/19/24 0447 07/20/24 0815  NA 141 140  --  140 140 139 141  K 4.9 4.8  --  5.2* 4.7 4.5 4.6  CL 104 102  --  103 100 100 98  CO2 34* 31  --  33* 34* 33* 39*  GLUCOSE 115* 87  --  89 67* 93 87  BUN 36* 31*  --  26* 21 23 23   CREATININE 0.74 0.65 0.69 0.69 0.65 0.63 0.64  CALCIUM  9.5 9.4  --  9.6 9.2 9.1 9.3  MG 2.4  --   --   --   --  2.0  --   PHOS 2.4*  --   --   --   --   --   --    Liver Function Tests: Recent Labs  Lab 07/14/24 0851 07/15/24 0259  AST 48* 40  ALT 6 6  ALKPHOS 54 50  BILITOT 0.7 0.6  PROT 5.5* 5.3*  ALBUMIN 3.3* 3.2*   CBG: Recent Labs  Lab 07/13/24 1522 07/15/24 2114  GLUCAP 102* 127*    Discharge time spent: greater than 30 minutes.  Signed: Elidia Toribio Furnace, MD [facility] 07/20/2024 "

## 2024-07-20 NOTE — TOC Transition Note (Signed)
 Transition of Care [facility]) - Discharge Note   Patient Details  Name: [name] MRN: [id number] Date of Birth: 10/10/1950  [facility] CM/SW Contact:  Isaiah Public, LCSWA Phone Number: 07/20/2024, 1:17 PM   Clinical Narrative:     Patient will DC to: [facility]   Anticipated DC date: 07/20/2024  Family notified: Marshall  Transport by: [facility]  ?  Per MD patient ready for DC to [facility] . Raoul in Admissions confirmed Bipap for patient is at the facility.RN, patient, patient's family, and facility notified of DC. Discharge Summary sent to facility. RN given number for report [phone] RM# [id number] ask for 1st floor RN station. DC packet on chart. Ambulance transport requested for patient.  CSW signing off.   Final next level of care: Skilled Nursing Facility Barriers to Discharge: No Barriers Identified   Patient Goals and CMS Choice Patient states their goals for this hospitalization and ongoing recovery are:: SNF   Choice offered to / list presented to : Patient, Spouse      Discharge Placement              Patient chooses bed at:  [facility]) Patient to be transferred to facility by: [facility] Name of family member notified: Marshall Patient and family notified of of transfer: 07/20/24  Discharge Plan and Services Additional resources added to the After Visit Summary for   In-house Referral: NA Discharge Planning Services: CM Consult Post Acute Care Choice: Home Health            DME Agency: NA                  Social Drivers of Health (SDOH) Interventions SDOH Screenings   Food Insecurity: No Food Insecurity (07/10/2024)  Housing: Low Risk (07/10/2024)  Transportation Needs: No Transportation Needs (07/10/2024)  Utilities: Not At Risk (07/10/2024)  Alcohol Screen: Low Risk (06/05/2024)  Depression (PHQ2-9): Low Risk (12/28/2023)  Financial Resource Strain: Low Risk (06/05/2024)  Physical Activity: Inactive (06/05/2024)   Social Connections: Socially Isolated (07/10/2024)  Stress: No Stress Concern Present (06/05/2024)  Tobacco Use: Medium Risk (07/09/2024)  Health Literacy: Adequate Health Literacy (12/28/2023)     Readmission Risk Interventions     No data to display

## 2024-07-20 NOTE — Plan of Care (Signed)
 Telephonic report called to receiving facility, [facility] at [phone] and requested transfer to 1st floor RN station.  Call was placed on hold greater than 5 minutes.  Able to give successful hand-off to Vee, RN by phone.  Patient will transport to SNF with [facility].   Problem: Acute Rehab PT Goals(only PT should resolve) Goal: Pt Will Go Supine/Side To Sit Outcome: Adequate for Discharge Goal: Patient Will Transfer Sit To/From Stand Outcome: Adequate for Discharge Goal: Pt Will Transfer Bed To Chair/Chair To Bed Outcome: Adequate for Discharge Goal: Pt Will Ambulate Outcome: Adequate for Discharge   Problem: Acute Rehab OT Goals (only OT should resolve) Goal: Pt. Will Perform Grooming Outcome: Adequate for Discharge Goal: Pt. Will Perform Upper Body Bathing Outcome: Adequate for Discharge Goal: Pt. Will Perform Lower Body Dressing Outcome: Adequate for Discharge Goal: Pt. Will Transfer To Toilet Outcome: Adequate for Discharge Goal: Pt. Will Perform Toileting-Clothing Manipulation Outcome: Adequate for Discharge Goal: Pt/Caregiver Will Perform Home Exercise Program Outcome: Adequate for Discharge   Problem: Increased Nutrient Needs (NI-5.1) Goal: Food and/or nutrient delivery Description: Individualized approach for food/nutrient provision. Outcome: Adequate for Discharge

## 2024-07-20 NOTE — TOC Progression Note (Addendum)
 Transition of Care [facility]) - Progression Note    Patient Details  Name: [name] MRN: [id number] Date of Birth: 03/15/1951  [facility]) CM/SW Contact  Isaiah Public, LCSWA Phone Number: 07/20/2024, 10:58 AM  Clinical Narrative:     CSW spoke with patients spouse [name] to follow up on SNF choice for patient. Patients spouse accepted SNF bed at [facility] as first choice for patient and [facility] as second choice for patient. Patients spouse confirmed that patient does not have a Bipap at home. MD confirmed with CSW that patient will need a Bipap at SNF. CSW spoke with [place] in admissions for [facility]. [facility] confirmed SNF bed for patient. CSW informed Benton that patient will need Bipap at SNF. [facility] informed CSW that they will place order for Bipap for patient, and will get back with CSW on if they are able to get Bipap in today for patient. CSW updated MD. CSW will continue to follow.  Update- CSW received call back from [place] in Admissions with [facility] who confirmed facility has Bipap at facility for patient. CSW spoke with Shanda in Respiratory who confirmed she is going to put in note with patients respiratory settings for Bipap. CSW informed Josh in admissions.Josh confirmed he has respiratory settings for patients Bipap.Raoul with admissions confirmed facility will have air mattress for patient. Josh confirmed facility can accept patient today if medically ready. CSW informed MD. CSW attached outpatient substance use treatment services resources to patients AVS.  Expected Discharge Plan: Skilled Nursing Facility Barriers to Discharge: SNF Pending bed offer               Expected Discharge Plan and Services In-house Referral: NA Discharge Planning Services: CM Consult Post Acute Care Choice: Home Health Living arrangements for the past 2 months: Skilled Nursing Facility                   DME Agency: NA                    Social Drivers of Health (SDOH) Interventions SDOH Screenings   Food Insecurity: No Food Insecurity (07/10/2024)  Housing: Low Risk (07/10/2024)  Transportation Needs: No Transportation Needs (07/10/2024)  Utilities: Not At Risk (07/10/2024)  Alcohol Screen: Low Risk (06/05/2024)  Depression (PHQ2-9): Low Risk (12/28/2023)  Financial Resource Strain: Low Risk (06/05/2024)  Physical Activity: Inactive (06/05/2024)  Social Connections: Socially Isolated (07/10/2024)  Stress: No Stress Concern Present (06/05/2024)  Tobacco Use: Medium Risk (07/09/2024)  Health Literacy: Adequate Health Literacy (12/28/2023)    Readmission Risk Interventions     No data to display

## 2024-07-25 ENCOUNTER — Encounter (HOSPITAL_BASED_OUTPATIENT_CLINIC_OR_DEPARTMENT_OTHER): Admitting: General Surgery

## 2024-07-30 ENCOUNTER — Other Ambulatory Visit (HOSPITAL_BASED_OUTPATIENT_CLINIC_OR_DEPARTMENT_OTHER): Payer: Self-pay

## 2024-08-03 ENCOUNTER — Encounter (HOSPITAL_BASED_OUTPATIENT_CLINIC_OR_DEPARTMENT_OTHER): Attending: General Surgery | Admitting: General Surgery

## 2024-08-03 DIAGNOSIS — I872 Venous insufficiency (chronic) (peripheral): Secondary | ICD-10-CM | POA: Insufficient documentation

## 2024-08-03 DIAGNOSIS — L942 Calcinosis cutis: Secondary | ICD-10-CM | POA: Insufficient documentation

## 2024-08-03 DIAGNOSIS — Z86718 Personal history of other venous thrombosis and embolism: Secondary | ICD-10-CM | POA: Insufficient documentation

## 2024-08-03 DIAGNOSIS — I890 Lymphedema, not elsewhere classified: Secondary | ICD-10-CM | POA: Insufficient documentation

## 2024-08-03 DIAGNOSIS — L97812 Non-pressure chronic ulcer of other part of right lower leg with fat layer exposed: Secondary | ICD-10-CM | POA: Insufficient documentation

## 2024-08-03 DIAGNOSIS — I10 Essential (primary) hypertension: Secondary | ICD-10-CM | POA: Insufficient documentation

## 2024-08-03 DIAGNOSIS — I2510 Atherosclerotic heart disease of native coronary artery without angina pectoris: Secondary | ICD-10-CM | POA: Insufficient documentation

## 2024-08-10 ENCOUNTER — Ambulatory Visit: Admitting: Physician Assistant

## 2024-08-10 ENCOUNTER — Encounter (HOSPITAL_BASED_OUTPATIENT_CLINIC_OR_DEPARTMENT_OTHER): Admitting: General Surgery

## 2024-08-10 DIAGNOSIS — L942 Calcinosis cutis: Secondary | ICD-10-CM | POA: Diagnosis not present

## 2024-08-10 DIAGNOSIS — I10 Essential (primary) hypertension: Secondary | ICD-10-CM | POA: Diagnosis not present

## 2024-08-10 DIAGNOSIS — Z86718 Personal history of other venous thrombosis and embolism: Secondary | ICD-10-CM | POA: Diagnosis not present

## 2024-08-10 DIAGNOSIS — I890 Lymphedema, not elsewhere classified: Secondary | ICD-10-CM | POA: Diagnosis not present

## 2024-08-10 DIAGNOSIS — L97812 Non-pressure chronic ulcer of other part of right lower leg with fat layer exposed: Secondary | ICD-10-CM | POA: Diagnosis present

## 2024-08-10 DIAGNOSIS — I872 Venous insufficiency (chronic) (peripheral): Secondary | ICD-10-CM | POA: Diagnosis not present

## 2024-08-10 DIAGNOSIS — I2510 Atherosclerotic heart disease of native coronary artery without angina pectoris: Secondary | ICD-10-CM | POA: Diagnosis not present

## 2024-08-13 ENCOUNTER — Telehealth: Payer: Self-pay

## 2024-08-13 ENCOUNTER — Other Ambulatory Visit: Payer: Self-pay

## 2024-08-13 ENCOUNTER — Ambulatory Visit: Payer: Self-pay

## 2024-08-13 ENCOUNTER — Emergency Department (HOSPITAL_COMMUNITY)

## 2024-08-13 ENCOUNTER — Inpatient Hospital Stay (HOSPITAL_COMMUNITY): Admission: EM | Admit: 2024-08-13 | Discharge: 2024-08-22 | DRG: 871 | Disposition: A | Discharge status: Home Health

## 2024-08-13 ENCOUNTER — Encounter (HOSPITAL_COMMUNITY): Payer: Self-pay | Admitting: Internal Medicine

## 2024-08-13 ENCOUNTER — Other Ambulatory Visit (HOSPITAL_BASED_OUTPATIENT_CLINIC_OR_DEPARTMENT_OTHER): Payer: Self-pay

## 2024-08-13 DIAGNOSIS — L8990 Pressure ulcer of unspecified site, unspecified stage: Secondary | ICD-10-CM | POA: Insufficient documentation

## 2024-08-13 DIAGNOSIS — I700 Atherosclerosis of aorta: Secondary | ICD-10-CM | POA: Diagnosis present

## 2024-08-13 DIAGNOSIS — K7200 Acute and subacute hepatic failure without coma: Secondary | ICD-10-CM | POA: Diagnosis present

## 2024-08-13 DIAGNOSIS — E872 Acidosis, unspecified: Secondary | ICD-10-CM | POA: Diagnosis present

## 2024-08-13 DIAGNOSIS — Z9049 Acquired absence of other specified parts of digestive tract: Secondary | ICD-10-CM

## 2024-08-13 DIAGNOSIS — L89150 Pressure ulcer of sacral region, unstageable: Secondary | ICD-10-CM | POA: Diagnosis not present

## 2024-08-13 DIAGNOSIS — I5042 Chronic combined systolic (congestive) and diastolic (congestive) heart failure: Secondary | ICD-10-CM | POA: Diagnosis present

## 2024-08-13 DIAGNOSIS — W06XXXA Fall from bed, initial encounter: Secondary | ICD-10-CM | POA: Diagnosis present

## 2024-08-13 DIAGNOSIS — E785 Hyperlipidemia, unspecified: Secondary | ICD-10-CM | POA: Diagnosis present

## 2024-08-13 DIAGNOSIS — T502X5A Adverse effect of carbonic-anhydrase inhibitors, benzothiadiazides and other diuretics, initial encounter: Secondary | ICD-10-CM | POA: Diagnosis present

## 2024-08-13 DIAGNOSIS — I330 Acute and subacute infective endocarditis: Secondary | ICD-10-CM

## 2024-08-13 DIAGNOSIS — R531 Weakness: Secondary | ICD-10-CM

## 2024-08-13 DIAGNOSIS — H409 Unspecified glaucoma: Secondary | ICD-10-CM | POA: Diagnosis present

## 2024-08-13 DIAGNOSIS — I082 Rheumatic disorders of both aortic and tricuspid valves: Secondary | ICD-10-CM | POA: Diagnosis present

## 2024-08-13 DIAGNOSIS — R7989 Other specified abnormal findings of blood chemistry: Secondary | ICD-10-CM | POA: Diagnosis present

## 2024-08-13 DIAGNOSIS — J9811 Atelectasis: Secondary | ICD-10-CM | POA: Diagnosis present

## 2024-08-13 DIAGNOSIS — A4102 Sepsis due to Methicillin resistant Staphylococcus aureus: Secondary | ICD-10-CM | POA: Diagnosis present

## 2024-08-13 DIAGNOSIS — R6521 Severe sepsis with septic shock: Secondary | ICD-10-CM | POA: Diagnosis present

## 2024-08-13 DIAGNOSIS — J9611 Chronic respiratory failure with hypoxia: Secondary | ICD-10-CM | POA: Diagnosis present

## 2024-08-13 DIAGNOSIS — D696 Thrombocytopenia, unspecified: Secondary | ICD-10-CM | POA: Diagnosis present

## 2024-08-13 DIAGNOSIS — I5033 Acute on chronic diastolic (congestive) heart failure: Secondary | ICD-10-CM

## 2024-08-13 DIAGNOSIS — Z96641 Presence of right artificial hip joint: Secondary | ICD-10-CM | POA: Diagnosis present

## 2024-08-13 DIAGNOSIS — I2510 Atherosclerotic heart disease of native coronary artery without angina pectoris: Secondary | ICD-10-CM | POA: Diagnosis present

## 2024-08-13 DIAGNOSIS — N182 Chronic kidney disease, stage 2 (mild): Secondary | ICD-10-CM | POA: Diagnosis present

## 2024-08-13 DIAGNOSIS — Z993 Dependence on wheelchair: Secondary | ICD-10-CM

## 2024-08-13 DIAGNOSIS — L89896 Pressure-induced deep tissue damage of other site: Secondary | ICD-10-CM | POA: Diagnosis present

## 2024-08-13 DIAGNOSIS — I4510 Unspecified right bundle-branch block: Secondary | ICD-10-CM | POA: Diagnosis present

## 2024-08-13 DIAGNOSIS — S81801A Unspecified open wound, right lower leg, initial encounter: Secondary | ICD-10-CM | POA: Diagnosis present

## 2024-08-13 DIAGNOSIS — L97919 Non-pressure chronic ulcer of unspecified part of right lower leg with unspecified severity: Secondary | ICD-10-CM | POA: Diagnosis present

## 2024-08-13 DIAGNOSIS — D649 Anemia, unspecified: Secondary | ICD-10-CM | POA: Diagnosis not present

## 2024-08-13 DIAGNOSIS — I739 Peripheral vascular disease, unspecified: Secondary | ICD-10-CM | POA: Diagnosis present

## 2024-08-13 DIAGNOSIS — L89159 Pressure ulcer of sacral region, unspecified stage: Secondary | ICD-10-CM | POA: Diagnosis not present

## 2024-08-13 DIAGNOSIS — N401 Enlarged prostate with lower urinary tract symptoms: Secondary | ICD-10-CM | POA: Diagnosis present

## 2024-08-13 DIAGNOSIS — L89316 Pressure-induced deep tissue damage of right buttock: Secondary | ICD-10-CM | POA: Diagnosis present

## 2024-08-13 DIAGNOSIS — I361 Nonrheumatic tricuspid (valve) insufficiency: Secondary | ICD-10-CM | POA: Diagnosis not present

## 2024-08-13 DIAGNOSIS — I130 Hypertensive heart and chronic kidney disease with heart failure and stage 1 through stage 4 chronic kidney disease, or unspecified chronic kidney disease: Secondary | ICD-10-CM | POA: Diagnosis present

## 2024-08-13 DIAGNOSIS — B957 Other staphylococcus as the cause of diseases classified elsewhere: Secondary | ICD-10-CM | POA: Diagnosis present

## 2024-08-13 DIAGNOSIS — R296 Repeated falls: Secondary | ICD-10-CM

## 2024-08-13 DIAGNOSIS — R578 Other shock: Secondary | ICD-10-CM | POA: Diagnosis not present

## 2024-08-13 DIAGNOSIS — N170 Acute kidney failure with tubular necrosis: Secondary | ICD-10-CM | POA: Diagnosis present

## 2024-08-13 DIAGNOSIS — Z6836 Body mass index (BMI) 36.0-36.9, adult: Secondary | ICD-10-CM

## 2024-08-13 DIAGNOSIS — N179 Acute kidney failure, unspecified: Principal | ICD-10-CM | POA: Diagnosis present

## 2024-08-13 DIAGNOSIS — G4733 Obstructive sleep apnea (adult) (pediatric): Secondary | ICD-10-CM | POA: Diagnosis not present

## 2024-08-13 DIAGNOSIS — D631 Anemia in chronic kidney disease: Secondary | ICD-10-CM | POA: Diagnosis present

## 2024-08-13 DIAGNOSIS — R571 Hypovolemic shock: Secondary | ICD-10-CM | POA: Diagnosis present

## 2024-08-13 DIAGNOSIS — J9612 Chronic respiratory failure with hypercapnia: Secondary | ICD-10-CM | POA: Diagnosis present

## 2024-08-13 DIAGNOSIS — L03115 Cellulitis of right lower limb: Secondary | ICD-10-CM | POA: Diagnosis present

## 2024-08-13 DIAGNOSIS — I5030 Unspecified diastolic (congestive) heart failure: Secondary | ICD-10-CM | POA: Diagnosis not present

## 2024-08-13 DIAGNOSIS — Z800 Family history of malignant neoplasm of digestive organs: Secondary | ICD-10-CM

## 2024-08-13 DIAGNOSIS — Z86718 Personal history of other venous thrombosis and embolism: Secondary | ICD-10-CM

## 2024-08-13 DIAGNOSIS — L89326 Pressure-induced deep tissue damage of left buttock: Secondary | ICD-10-CM | POA: Diagnosis present

## 2024-08-13 DIAGNOSIS — E662 Morbid (severe) obesity with alveolar hypoventilation: Secondary | ICD-10-CM | POA: Diagnosis present

## 2024-08-13 DIAGNOSIS — I82499 Acute embolism and thrombosis of other specified deep vein of unspecified lower extremity: Secondary | ICD-10-CM

## 2024-08-13 DIAGNOSIS — Z860101 Personal history of adenomatous and serrated colon polyps: Secondary | ICD-10-CM

## 2024-08-13 DIAGNOSIS — L89152 Pressure ulcer of sacral region, stage 2: Secondary | ICD-10-CM | POA: Diagnosis present

## 2024-08-13 DIAGNOSIS — X58XXXA Exposure to other specified factors, initial encounter: Secondary | ICD-10-CM | POA: Diagnosis present

## 2024-08-13 DIAGNOSIS — I872 Venous insufficiency (chronic) (peripheral): Secondary | ICD-10-CM | POA: Diagnosis present

## 2024-08-13 DIAGNOSIS — G7241 Inclusion body myositis [IBM]: Secondary | ICD-10-CM

## 2024-08-13 DIAGNOSIS — Z955 Presence of coronary angioplasty implant and graft: Secondary | ICD-10-CM

## 2024-08-13 DIAGNOSIS — Z87891 Personal history of nicotine dependence: Secondary | ICD-10-CM

## 2024-08-13 DIAGNOSIS — B9561 Methicillin susceptible Staphylococcus aureus infection as the cause of diseases classified elsewhere: Secondary | ICD-10-CM | POA: Diagnosis not present

## 2024-08-13 DIAGNOSIS — Z7901 Long term (current) use of anticoagulants: Secondary | ICD-10-CM | POA: Diagnosis not present

## 2024-08-13 DIAGNOSIS — E860 Dehydration: Secondary | ICD-10-CM | POA: Diagnosis present

## 2024-08-13 DIAGNOSIS — I110 Hypertensive heart disease with heart failure: Secondary | ICD-10-CM | POA: Diagnosis not present

## 2024-08-13 DIAGNOSIS — Z6838 Body mass index (BMI) 38.0-38.9, adult: Secondary | ICD-10-CM

## 2024-08-13 DIAGNOSIS — Z8249 Family history of ischemic heart disease and other diseases of the circulatory system: Secondary | ICD-10-CM

## 2024-08-13 DIAGNOSIS — I890 Lymphedema, not elsewhere classified: Secondary | ICD-10-CM | POA: Diagnosis present

## 2024-08-13 DIAGNOSIS — Z8349 Family history of other endocrine, nutritional and metabolic diseases: Secondary | ICD-10-CM

## 2024-08-13 DIAGNOSIS — R7881 Bacteremia: Secondary | ICD-10-CM | POA: Diagnosis not present

## 2024-08-13 DIAGNOSIS — Z981 Arthrodesis status: Secondary | ICD-10-CM

## 2024-08-13 DIAGNOSIS — R579 Shock, unspecified: Secondary | ICD-10-CM | POA: Diagnosis not present

## 2024-08-13 DIAGNOSIS — Z79899 Other long term (current) drug therapy: Secondary | ICD-10-CM

## 2024-08-13 LAB — COMPREHENSIVE METABOLIC PANEL WITH GFR
ALT: 20 U/L (ref 0–44)
AST: 82 U/L — ABNORMAL HIGH (ref 15–41)
Albumin: 3.6 g/dL (ref 3.5–5.0)
Alkaline Phosphatase: 92 U/L (ref 38–126)
Anion gap: 12 (ref 5–15)
BUN: 49 mg/dL — ABNORMAL HIGH (ref 8–23)
CO2: 34 mmol/L — ABNORMAL HIGH (ref 22–32)
Calcium: 9.2 mg/dL (ref 8.9–10.3)
Chloride: 92 mmol/L — ABNORMAL LOW (ref 98–111)
Creatinine, Ser: 3.14 mg/dL — ABNORMAL HIGH (ref 0.61–1.24)
GFR, Estimated: 20 mL/min — ABNORMAL LOW
Glucose, Bld: 96 mg/dL (ref 70–99)
Potassium: 4.8 mmol/L (ref 3.5–5.1)
Sodium: 138 mmol/L (ref 135–145)
Total Bilirubin: 0.6 mg/dL (ref 0.0–1.2)
Total Protein: 6.3 g/dL — ABNORMAL LOW (ref 6.5–8.1)

## 2024-08-13 LAB — CBC
HCT: 35 % — ABNORMAL LOW (ref 39.0–52.0)
Hemoglobin: 10.9 g/dL — ABNORMAL LOW (ref 13.0–17.0)
MCH: 34.6 pg — ABNORMAL HIGH (ref 26.0–34.0)
MCHC: 31.1 g/dL (ref 30.0–36.0)
MCV: 111.1 fL — ABNORMAL HIGH (ref 80.0–100.0)
Platelets: 100 K/uL — ABNORMAL LOW (ref 150–400)
RBC: 3.15 MIL/uL — ABNORMAL LOW (ref 4.22–5.81)
RDW: 15 % (ref 11.5–15.5)
WBC: 9.3 K/uL (ref 4.0–10.5)
nRBC: 0 % (ref 0.0–0.2)

## 2024-08-13 LAB — I-STAT CG4 LACTIC ACID, ED: Lactic Acid, Venous: 2.2 mmol/L (ref 0.5–1.9)

## 2024-08-13 LAB — TROPONIN T, HIGH SENSITIVITY
Troponin T High Sensitivity: 306 ng/L (ref 0–19)
Troponin T High Sensitivity: 261 ng/L (ref 0–19)

## 2024-08-13 LAB — LACTIC ACID, PLASMA: Lactic Acid, Venous: 1.6 mmol/L (ref 0.5–1.9)

## 2024-08-13 LAB — PRO BRAIN NATRIURETIC PEPTIDE: Pro Brain Natriuretic Peptide: 713 pg/mL — ABNORMAL HIGH

## 2024-08-13 MED ORDER — ACETAMINOPHEN 650 MG RE SUPP: 650.0000 mg | Freq: Four times a day (QID) | RECTAL | Status: AC | PRN

## 2024-08-13 MED ORDER — APIXABAN 2.5 MG PO TABS
2.5000 mg | ORAL_TABLET | Freq: Two times a day (BID) | ORAL | Status: DC
  Administered 2024-08-13 – 2024-08-22 (×17): 2.5 mg via ORAL
  Filled 2024-08-13 (×18): qty 1

## 2024-08-13 MED ORDER — ZINC OXIDE 40 % EX OINT
1.0000 | TOPICAL_OINTMENT | Freq: Two times a day (BID) | CUTANEOUS | Status: DC
  Administered 2024-08-13 – 2024-08-22 (×17): 1 via TOPICAL
  Filled 2024-08-13 (×2): qty 57

## 2024-08-13 MED ORDER — ONDANSETRON HCL 4 MG PO TABS: 4.0000 mg | ORAL_TABLET | Freq: Four times a day (QID) | ORAL | Status: AC | PRN

## 2024-08-13 MED ORDER — LACTATED RINGERS IV SOLN: INTRAVENOUS | Status: DC

## 2024-08-13 MED ORDER — EZETIMIBE 10 MG PO TABS
10.0000 mg | ORAL_TABLET | Freq: Every evening | ORAL | Status: AC
  Administered 2024-08-13 – 2024-08-21 (×8): 10 mg via ORAL
  Filled 2024-08-13 (×8): qty 1

## 2024-08-13 MED ORDER — TRAZODONE HCL 50 MG PO TABS
25.0000 mg | ORAL_TABLET | Freq: Every evening | ORAL | Status: DC | PRN
  Administered 2024-08-15 – 2024-08-20 (×6): 25 mg via ORAL
  Filled 2024-08-13 (×6): qty 1

## 2024-08-13 MED ORDER — ALBUTEROL SULFATE (2.5 MG/3ML) 0.083% IN NEBU
2.5000 mg | INHALATION_SOLUTION | RESPIRATORY_TRACT | Status: DC | PRN
  Administered 2024-08-14 – 2024-08-19 (×6): 2.5 mg via RESPIRATORY_TRACT
  Filled 2024-08-13 (×6): qty 3

## 2024-08-13 MED ORDER — LACTATED RINGERS IV BOLUS
1000.0000 mL | Freq: Once | INTRAVENOUS | Status: AC
  Administered 2024-08-13: 1000 mL via INTRAVENOUS

## 2024-08-13 MED ORDER — LACTATED RINGERS IV BOLUS
500.0000 mL | Freq: Once | INTRAVENOUS | Status: AC
  Administered 2024-08-13: 500 mL via INTRAVENOUS

## 2024-08-13 MED ORDER — ACETAMINOPHEN 325 MG PO TABS
650.0000 mg | ORAL_TABLET | Freq: Four times a day (QID) | ORAL | Status: DC | PRN
  Administered 2024-08-16 – 2024-08-22 (×2): 650 mg via ORAL
  Filled 2024-08-13 (×3): qty 2

## 2024-08-13 MED ORDER — ENSURE PLUS HIGH PROTEIN PO LIQD: 237.0000 mL | Freq: Three times a day (TID) | ORAL | Status: DC

## 2024-08-13 MED ORDER — ONDANSETRON HCL 4 MG/2ML IJ SOLN
4.0000 mg | Freq: Four times a day (QID) | INTRAMUSCULAR | Status: AC | PRN
  Administered 2024-08-15: 4 mg via INTRAVENOUS
  Filled 2024-08-13: qty 2

## 2024-08-13 NOTE — Consult Note (Addendum)
 WOC Nurse Consult Note: Reason for Consult:sacral wound  Wound type: 1.  deep tissue pressure injury sacrum extending down medial buttocks; DTPI bilateral ischium purple maroon discoloration evolving with small area of red moist tissue noted  2.  Stage 2 coccyx linear pink moist  Pressure Injury POA: Yes Measurement: see nursing flowsheet  Wound bed: as above  Drainage (amount, consistency, odor) serosanguinous at ischium  Periwound: erythema  Dressing procedure/placement/frequency:  Cleanse sacrum/coccyx with NS, apply a small piece of Aquacel AG silver hydrofiber to coccyx and secure with silicone foam.  Apply a thin layer of Desitin to buttocks 2 times daily and prn soiling.  Cleanse B ischium/posterior thighs with NS, apply Xeroform gauze to areas daily and secure with silicone foam.   Patient is followed at wound care center for lymphedema and ulcerations to right leg; last seen 1/16 with silver hydrofiber and compression wrap applied; this is performed weekly at [facility] due next 08/18/2023. WOC team will reassess if patient remains inpatient.    POC discussed with bedside nurse. WOC team will follow as above.  Reconsult if further needs arise.   Thank you,    Powell Bar MSN, RN-BC, [employer]

## 2024-08-13 NOTE — Telephone Encounter (Signed)
 Pt's wife sent the following MyChart message:  Octaviano fell to the floor this morning while we were getting him ready to go to the emergency room. We ended up getting the rescue squad to come get him onto a stretcher and transport him to [facility]. One of the EMTs suggested that we ask our PCP to prescribe a Hoyer Lift to have on hand for situations like this. He said he had it for his parents and it helped everyone. He also said that Medicare paid for it. Would you please prescribe that for us ? or let me know if there is other information you need. Thanks.

## 2024-08-13 NOTE — ED Provider Notes (Signed)
 " [place] [facility] DEPARTMENT AT [facility] Provider Note   CSN: [id number] Arrival date & time: 08/13/24  1145     Patient presents with: [name] is a 74 y.o. adult past medical history significant for CAD, right bundle branch block, hypertension, hyperlipidemia who presents concern for sliding off of bed without hitting head.  He was just discharged from rehab facility.  He reports sliding out of head earlier today, denies any injury, pain.  Does take blood thinner.  History of heart failure exacerbation, new oxygen requirement.    Fall       Prior to Admission medications  Medication Sig Start Date End Date Taking? Authorizing Provider  acetaminophen  (TYLENOL ) 325 MG tablet Take 2 tablets (650 mg total) by mouth every 6 (six) hours as needed (as needed for pain). 07/20/24   Arrien, Elidia Sieving, MD  apixaban  (ELIQUIS ) 2.5 MG TABS tablet Take 1 tablet (2.5 mg total) by mouth 2 (two) times daily. 07/04/23   Okey Vina GAILS, MD  atenolol  (TENORMIN ) 25 MG tablet Take 1 tablet (25 mg total) by mouth daily. 07/21/24   Arrien, Mauricio Daniel, MD  b complex vitamins tablet Take 1 tablet by mouth every evening.    [provider]  Cholecalciferol (VITAMIN D-3 PO) Take 1 capsule by mouth daily.    [provider]  Coenzyme Q10 (COQ-10 PO) Take 1 capsule by mouth every evening.    [provider]  ezetimibe  (ZETIA ) 10 MG tablet Take 1 tablet (10 mg total) by mouth daily. Patient taking differently: Take 10 mg by mouth every evening. 05/21/24   Lucien Orren SAILOR, PA-C  feeding supplement (ENSURE PLUS HIGH PROTEIN) LIQD Take 237 mLs by mouth 3 (three) times daily between meals. 07/20/24   Arrien, Elidia Sieving, MD  FOLIC ACID  PO Take 1 tablet by mouth daily.    [provider]  KRILL OIL PO Take 1 capsule by mouth daily.    [provider]  Multiple Vitamin (MULTIVITAMIN WITH MINERALS) TABS tablet Take 1 tablet by mouth daily.  07/21/24   Arrien, Mauricio Daniel, MD  nutrition supplement, JUVEN, (JUVEN) PACK Take 1 packet by mouth 2 (two) times daily between meals. 07/20/24   Arrien, Mauricio Daniel, MD  polyethylene glycol (MIRALAX  / GLYCOLAX ) 17 g packet Take 17 g by mouth daily. 07/21/24   Arrien, Elidia Sieving, MD  tamsulosin  (FLOMAX ) 0.4 MG CAPS capsule Take 1 capsule (0.4 mg total) by mouth daily. Patient taking differently: Take 0.4 mg by mouth every evening. 04/24/24   McGowen, Aleene DEL, MD  torsemide  (DEMADEX ) 20 MG tablet Take 1 tablet (20 mg total) by mouth daily. 07/21/24   Arrien, Elidia Sieving, MD    Allergies: Patient has no known allergies.    Review of Systems  All other systems reviewed and are negative.   Updated Vital Signs BP (!) 104/46   Pulse 63   Temp (!) 97.5 F (36.4 C) (Oral)   Resp 16   SpO2 100%   Physical Exam Vitals and nursing note reviewed.  Constitutional:      General: He is not in acute distress.    Appearance: Normal appearance.  HENT:     Head: Normocephalic and atraumatic.  Eyes:     General:        Right eye: No discharge.        Left eye: No discharge.  Cardiovascular:     Rate and Rhythm: Normal rate and regular rhythm.  Pulmonary:     Effort: Pulmonary effort is normal. No respiratory distress.  Musculoskeletal:        General: No deformity.  Skin:    General: Skin is warm and dry.     Comments: Diffuse bruising on exposed skin surfaces, no active bleeding, no obvious recent bruises, persisting to be in multiple stages of healing from previous fall/falls.  Neurological:     Mental Status: He is alert and oriented to person, place, and time.  Psychiatric:        Mood and Affect: Mood normal.        Behavior: Behavior normal.     (all labs ordered are listed, but only abnormal results are displayed) Labs Reviewed  CBC - Abnormal; Notable for the following components:      Result Value   RBC 3.15 (*)    Hemoglobin 10.9 (*)    HCT 35.0 (*)     MCV 111.1 (*)    MCH 34.6 (*)    Platelets 100 (*)    All other components within normal limits  COMPREHENSIVE METABOLIC PANEL WITH GFR - Abnormal; Notable for the following components:   Chloride 92 (*)    CO2 34 (*)    BUN 49 (*)    Creatinine, Ser 3.14 (*)    Total Protein 6.3 (*)    AST 82 (*)    GFR, Estimated 20 (*)    All other components within normal limits  PRO BRAIN NATRIURETIC PEPTIDE - Abnormal; Notable for the following components:   Pro Brain Natriuretic Peptide 713.0 (*)    All other components within normal limits  I-STAT CG4 LACTIC ACID, ED - Abnormal; Notable for the following components:   Lactic Acid, Venous 2.2 (*)    All other components within normal limits  TROPONIN T, HIGH SENSITIVITY - Abnormal; Notable for the following components:   Troponin T High Sensitivity 306 (*)    All other components within normal limits  CULTURE, BLOOD (ROUTINE X 2)  CULTURE, BLOOD (ROUTINE X 2)  URINALYSIS, ROUTINE W REFLEX MICROSCOPIC  I-STAT CG4 LACTIC ACID, ED  TROPONIN T, HIGH SENSITIVITY    EKG: None  Radiology: DG Chest 1 View Result Date: 08/13/2024 CLINICAL DATA:  Shortness of breath. EXAM: CHEST  1 VIEW COMPARISON:  07/17/2024. FINDINGS: Persistent low lung volumes with chronic elevation of the right hemidiaphragm and increased right basilar atelectasis. Left lung appears clear. No pneumothorax. Stable cardiomegaly. No acute osseous abnormality. IMPRESSION: Persistent low lung volumes with chronic elevation of the right hemidiaphragm and increased right basilar atelectasis. Electronically Signed   By: Harrietta Sherry M.D.   On: 08/13/2024 13:54     .Critical Care  Performed by: Rosan Sherlean DEL, PA-C Authorized by: Rosan Sherlean DEL, PA-C   Critical care provider statement:    Critical care time (minutes):  35   Critical care was necessary to treat or prevent imminent or life-threatening deterioration of the following conditions:  Shock and renal  failure   Critical care was time spent personally by me on the following activities:  Development of treatment plan with patient or surrogate, discussions with consultants, evaluation of patient's response to treatment, examination of patient, ordering and review of laboratory studies, ordering and review of radiographic studies, ordering and performing treatments and interventions, pulse oximetry, re-evaluation of patient's condition and review of old charts   Care discussed with: admitting provider      Medications Ordered in the ED  lactated ringers  bolus 500 mL (  0 mLs Intravenous Stopped 08/13/24 1425)  lactated ringers  bolus 1,000 mL (1,000 mLs Intravenous New Bag/Given 08/13/24 1400)    Clinical Course as of 08/13/24 1442  Mon Aug 13, 2024  1442 Cancel sepsis protocol [CP]    Clinical Course User Index [CP] Rosan Sherlean DEL, PA-C                                 Medical Decision Making  This patient is a 74 y.o. adult  who presents to the ED for concern of fall, weakness, hypotension.   Differential diagnoses prior to evaluation: The emergent differential diagnosis includes, but is not limited to,  CAD, right bundle branch block, hypertension, hyperlipidemia . This is not an exhaustive differential.   Past Medical History / Co-morbidities / Social History:  CAD, right bundle branch block, hypertension, hyperlipidemia  Additional history: Chart reviewed. Pertinent results include: Reviewed lab work, imaging from recent previous emergency department visits, hospital admissions  Physical Exam: Physical exam performed. The pertinent findings include: Patient initially quite hypotensive, blood pressure 79/40.  Improved after fluid bolus, 104/46.  He has some lymphedema but no pitting edema noted bilaterally.  Lab Tests/Imaging studies: I personally interpreted labs/imaging and the pertinent results include: CBC notable for improved hemoglobin, 10.9 from recent baseline of 8.9,  improved thrombocytopenia, platelets 100 from recent baseline of 50, suspect that patient is hemoconcentrated in context of CMP which shows significantly elevated BUN, 49, creatinine 3.14 with acute renal failure, suspect that patient is clinically quite dehydrated after taking 3 times his recommended dose of torsemide  in an attempt to drain fluid from his lymphedema.  He has an elevated troponin at 306, suspect demand ischemia, and elevated in context of not clearing troponin with his renal failure.  His BNP is within normal limits for his age.  No evidence of heart failure.  His lactic acid is elevated 2.2.Plain film chest xray shows:  Persistent low lung volumes with chronic elevation of the right  hemidiaphragm and increased right basilar atelectasis.  I agree with the radiologist interpretation.  Medications: I ordered medication including fluid bolus for significant dehydration, acute renal failure.  I have reviewed the patients home medicines and have made adjustments as needed.   Consults: With the hospitalist, Dr. Zella who agrees to admission for acute renal failure secondary likely to diuretic overuse  Disposition: After consideration of the diagnostic results and the patients response to treatment, I feel that patient benefit from hospital admission as discussed above.     Final diagnoses:  Acute renal failure, unspecified acute renal failure type    ED Discharge Orders     None          Rosan Sherlean DEL, PA-C 08/13/24 1428  "

## 2024-08-13 NOTE — ED Triage Notes (Signed)
 Pt BIB ems after a fall, he slide off the bed, did not hit his head, is on eliquis , has spinal stenosis, is bed bound. Pt was discharged from rehab facility recently for urinary retention, but has not urinated in 24 hrs. On 4L o2. VS stable

## 2024-08-13 NOTE — Sepsis Progress Note (Signed)
 Sepsis protocol is being followed by [facility].

## 2024-08-13 NOTE — Telephone Encounter (Signed)
 FYI Only or Action Required?: FYI only for provider: ED advised.  Patient was last seen in primary care on 06/06/2024 by McGowen, Aleene DEL, MD.  Called Nurse Triage reporting Urinary Retention.  Symptoms began yesterday.  Interventions attempted: Nothing.  Symptoms are: unchanged.  Triage Disposition: Go to ED Now (Notify PCP)  Patient/caregiver understands and will follow disposition?: Yes       Message from [facility] C sent at 08/13/2024  9:28 AM EST  Reason for Triage: Patient wife says patient has  not urinating in 24hrs and that he previously had this problem which caused him to go to hospital for his kidneys    Reason for Disposition  [1] Unable to urinate (or only a few drops) > 4 hours AND [2] bladder feels very full (e.g., palpable bladder or strong urge to urinate)  Answer Assessment - Initial Assessment Questions Patient's wife called and states the patient was taken to the hospital in December (15th) and was admitted Dec 26th--sent to a rehab Sent home on Jan 17th (two days ago) Wife states that the patient has not urinated in over 24 hours. She noticed no urine in the bedside toilet and she also asked him and he confirmed that he has not urinated any in those 24 hours. Patient's only complaint was neck pain but wife was concerned about the patient not urinating She denies the patient having any fevers and the patient has been taking in fluids Patient also took Lasix  yesterday  When patient was in the hospital he was retaining fluid at that time as well  Patient's last bowel movement was two days ago.  Wife states that since Saturday the patient's right leg has swollen more in the past two days.  Patient's wife is advised that at this time with his symptoms the recommendation is for him to go back to the Emergency Room for further evaluation. She agrees with this and states she will take him back to the ER at this time Patient's wife is advised to call us  back  if anything changes or with any further questions/concerns. Patient's wife is advised that if anything worsens to call 911. She verbalized understanding.  Protocols used: Urinary Symptoms-A-AH

## 2024-08-13 NOTE — H&P (Signed)
 " History and Physical  [name] FMW:[id number] DOB: [birth year] DOA: 08/13/2024  PCP: Candise Aleene DEL, MD   Chief Complaint: Weakness  HPI: [name] is a 74 y.o. 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  History is provided by my discussion with the patient, as well as discussion with ER provider.  Seems that he just got out of rehab quite recently, he denies any cough, shortness of breath, recent fevers or chills.  It seems that he was at home today and slid onto the ground.  Says that he has continued to take his prescribed medications, and that his leg swelling is actually better than usual.  Per my discussion with ER provider, patient apparently has taken extra doses of his torsemide  the last few days in an attempt to improve his lower extremity edema.  Workup in the emergency department shows acute kidney injury, significant hypotension.  Patient's blood pressure is gradually improving after receiving IV fluids, currently he is asymptomatic.  Review of Systems: Please see HPI for pertinent positives and negatives. A complete 10 system review of systems are otherwise negative.  Past Medical History:  Diagnosis Date   Adenomatous colon polyp - diminutive 06/2019   no recall needed   Allergy sinus   hay fever   Bilateral lower extremity edema    BPH with lower urinary tract symptoms without urinary obstruction    Cataract    Coronary artery disease    a. s/p BMS to LAD (1998) b. s/p DES to RCA (06/24/14)   DVT (deep venous thrombosis) (HCC)    12/2020 L femoral and poplitea.   Unprovoked.  hypercoag w/u NEG.   Eliquis .   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 ?inclusion body myositis? Power WC as of 2022   Macrocytic anemia    Unknown etiology (bone marrow biopsy benign 2025)   Osteoarthritis, multiple sites    Palatal fistula    Palpable abd. aorta    Aortic u/s NO ANEURISM 2020   RBBB    Spinal stenosis    Dr Royal Bury  surgery   Thrombocytopenia    Uknown etiology (Bone marrow biopsy benign 2025)   Past Surgical History:  Procedure Laterality Date   APPENDECTOMY  2010   BONE MARROW BIOPSY     2025 benign   CERVICAL FUSION  09/2009   CHOLECYSTECTOMY  1998   COLONOSCOPY     2020-->no recall   EYE SURGERY     Lasik   HYDROCELE EXCISION  2005   JOINT REPLACEMENT  05/22/20   right hip   LEFT HEART CATHETERIZATION WITH CORONARY ANGIOGRAM N/A 06/24/2014   Procedure: LEFT HEART CATHETERIZATION WITH CORONARY ANGIOGRAM;  Surgeon: Debby DELENA Sor, MD;  Location: [facility] CATH LAB;  Service: Cardiovascular;  Laterality: N/A;   LUMBAR SPINE SURGERY  11/2011   MUSCLE BIOPSY Left 07/11/2018   Procedure: Left Vastus lateralus muscle biopsy;  Surgeon: Cheryle Debby LABOR, MD;  Location: [facility] OR;  Service: Neurosurgery;  Laterality: Left;  Left Vastus lateralus muscle biopsy   PERCUTANEOUS CORONARY STENT INTERVENTION (PCI-S)  06/24/2014   DES to RCA. Procedure: PERCUTANEOUS CORONARY STENT INTERVENTION (PCI-S);  Surgeon: Debby LABOR Sor, MD;  Location: [facility] CATH LAB;  Service: Cardiovascular;;  Distal RCA   Rhythm monitoring     04/2023, occ pvc's   SPINE SURGERY     TONSILLECTOMY AND ADENOIDECTOMY  2010-09-12   TOTAL HIP ARTHROPLASTY Right 09/13/19   RIGHT   TRANSTHORACIC ECHOCARDIOGRAM     03/2019  normal.  Nov 2024 mild RV dysf, o/w normal   Social History:  reports that he quit smoking about 35 years ago. His smoking use included cigarettes. He started smoking about 58 years ago. He has a 17.3 pack-year smoking history. He has never used smokeless tobacco. He reports current alcohol use. He reports that he does not use drugs.  Allergies[1]  Family History  Problem Relation Age of Onset   Heart attack Mother    Esophageal cancer Mother    Cancer Mother    Aneurysm Father    Heart disease Father    Early death Father    Cystic fibrosis Brother        no prior health issues, rare form of cystic fibrosis of the lungs 12-Apr-2025 12-Sep-2014)   Early death Brother    Stroke Neg Hx      Prior to Admission medications  Medication Sig Start Date End Date Taking? Authorizing Provider  acetaminophen  (TYLENOL ) 325 MG tablet Take 2 tablets (650 mg total) by mouth every 6 (six) hours as needed (as needed for pain). 07/20/24   Arrien, Elidia Sieving, MD  apixaban  (ELIQUIS ) 2.5 MG TABS tablet Take 1 tablet (2.5 mg total) by mouth 2 (two) times daily. 07/04/23   Okey Vina GAILS, MD  atenolol  (TENORMIN ) 25 MG tablet Take 1 tablet (25 mg total) by mouth daily. 07/21/24   Arrien, Mauricio Daniel, MD  b complex vitamins tablet Take 1 tablet by mouth every evening.    [provider]  Cholecalciferol (VITAMIN D-3 PO) Take 1 capsule by mouth daily.    [provider]  Coenzyme Q10 (COQ-10 PO) Take 1 capsule by mouth every evening.    [provider]  ezetimibe  (ZETIA ) 10 MG tablet Take 1 tablet (10 mg total) by mouth daily. Patient taking differently: Take 10 mg by mouth every evening. 05/21/24   Lucien Orren SAILOR, PA-C  feeding supplement (ENSURE PLUS HIGH PROTEIN) LIQD Take 237 mLs by mouth 3 (three) times daily between meals. 07/20/24   Arrien, Elidia Sieving, MD  FOLIC ACID  PO Take 1 tablet by mouth daily.    [provider]  KRILL OIL PO Take 1 capsule by mouth daily.     [provider]  Multiple Vitamin (MULTIVITAMIN WITH MINERALS) TABS tablet Take 1 tablet by mouth daily. 07/21/24   Arrien, Mauricio Daniel, MD  nutrition supplement, JUVEN, (JUVEN) PACK Take 1 packet by mouth 2 (two) times daily between meals. 07/20/24   Arrien, Mauricio Daniel, MD  polyethylene glycol (MIRALAX  / GLYCOLAX ) 17 g packet Take 17 g by mouth daily. 07/21/24   Arrien, Elidia Sieving, MD  tamsulosin  (FLOMAX ) 0.4 MG CAPS capsule Take 1 capsule (0.4 mg total) by mouth daily. Patient taking differently: Take 0.4 mg by mouth every evening. 04/24/24   McGowen,  Aleene DEL, MD  torsemide  (DEMADEX ) 20 MG tablet Take 1 tablet (20 mg total) by mouth daily. 07/21/24   Arrien, Elidia Sieving, MD    Physical Exam: BP (!) 104/46   Pulse 63   Temp (!) 97.5 F (36.4 C) (Oral)   Resp 16   SpO2 100%  General:  Alert, oriented, calm, in no acute distress, chronically ill-appearing.  Saturating 100% on 2 L nasal cannula oxygen. Eyes: EOMI, clear conjuctivae, white sclerea Neck: supple, no masses, trachea mildline  Cardiovascular: RRR, no murmurs or rubs, chronic appearing lower extremity edema, without associated erythema, open areas or drainage Respiratory: clear to auscultation bilaterally though breath sounds are distant on anterior exam, no cough, no wheezes, no crackles, no tachypnea or other evidence of respiratory distress Abdomen: soft, nontender, obese, normal bowel tones heard  Skin: dry, no rashes  Musculoskeletal: no joint effusions, normal range of motion  Psychiatric: appropriate affect, normal speech  Neurologic: extraocular muscles intact, clear speech, moving all extremities with intact sensorium         Labs on Admission:  Basic Metabolic Panel: Recent Labs  Lab 08/13/24 1234  NA 138  K 4.8  CL 92*  CO2 34*  GLUCOSE 96  BUN 49*  CREATININE 3.14*  CALCIUM  9.2   Liver Function Tests: Recent Labs  Lab 08/13/24 1234  AST 82*  ALT 20  ALKPHOS 92  BILITOT  0.6  PROT 6.3*  ALBUMIN  3.6   No results for input(s): LIPASE, AMYLASE in the last 168 hours. No results for input(s): AMMONIA in the last 168 hours. CBC: Recent Labs  Lab 08/13/24 1234  WBC 9.3  HGB 10.9*  HCT 35.0*  MCV 111.1*  PLT 100*   Cardiac Enzymes: No results for input(s): CKTOTAL, CKMB, CKMBINDEX, TROPONINI in the last 168 hours. BNP (last 3 results) No results for input(s): BNP in the last 8760 hours.  ProBNP (last 3 results) Recent Labs    04/10/24 1338 07/09/24 1230 08/13/24 1234  PROBNP 549* 909.0* 713.0*    CBG: No results for input(s): GLUCAP in the last 168 hours.  Radiological Exams on Admission: DG Chest 1 View Result Date: 08/13/2024 CLINICAL DATA:  Shortness of breath. EXAM: CHEST  1 VIEW COMPARISON:  07/17/2024. FINDINGS: Persistent low lung volumes with chronic elevation of the right hemidiaphragm and increased right basilar atelectasis. Left lung appears clear. No pneumothorax. Stable cardiomegaly. No acute osseous abnormality. IMPRESSION: Persistent low lung volumes with chronic elevation of the right hemidiaphragm and increased right basilar atelectasis. Electronically Signed   By: Harrietta Sherry M.D.   On: 08/13/2024 13:54   Assessment/Plan Kaelum Kissick is a 73 y.o. 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  Hypotension and acute kidney injury superimposed on baseline normal renal function-likely due to dehydration in the setting of taking extra doses of torsemide  in the last day or 2, likely also contributing to his chronic  weakness.  Patient does appear to be dehydrated on exam, bladder scan without urine present.  Hypotensive but stable, with blood pressure improving with IV fluids. -Inpatient admission -Monitor on progressive due to hypotension -Hold diuretics and nephrotoxins, continue IV fluid resuscitation with LR  Hypertension-hold  home atenolol   Acute hypoxia-this is asymptomatic, patient does have a history of OSA, obesity likely leading to hypoventilation, and atelectasis on chest x-ray -Continue supplemental oxygen, and wean as tolerated -No signs or symptoms of acute infection -Incentive spirometer for atelectasis  OSA-CPAP nightly  Heart failure with preserved EF-holding diuretics as above, patient does appear dry on exam -Monitor closely for signs or symptoms of acute heart failure, as he is hydrated  Elevated troponin-this is a chronic issue for him, he remains on anticoagulation, has no chest pain.  Elevated troponin likely mild leak due to hypotension, and poor clearance due to acute on chronic renal failure  Lactic acidosis-acute infection not suspected, this is likely due to dehydration.  Continue hydration as above, and trend lactate later this afternoon.  History of DVT-Eliquis   Inclusion body myositis, with chronic weakness-PT/OT  DVT prophylaxis: Continue Eliquis     Code Status: Full Code  Consults called: None  Admission status: The appropriate patient status for this patient is INPATIENT. Inpatient status is judged to be reasonable and necessary in order to provide the required intensity of service to ensure the patient's safety. The patient's presenting symptoms, physical exam findings, and initial radiographic and laboratory data in the context of their chronic comorbidities is felt to place them at high risk for further clinical deterioration. Furthermore, it is not anticipated that the patient will be medically stable for discharge from the hospital within 2 midnights of  admission.    I certify that at the point of admission it is my clinical judgment that the patient will require inpatient hospital care spanning beyond 2 midnights from the point of admission due to high intensity of service, high risk for further deterioration and high frequency of surveillance required  Time spent: 59 minutes  Rayola Everhart CHRISTELLA Gail MD [facility] Pager [phone]  If 7PM-7AM, please contact night-coverage [email or web address] Password [email or web address]  08/13/2024, 3:10 PM      [1] No Known Allergies  "

## 2024-08-13 NOTE — Telephone Encounter (Signed)
 Please do DME order for  [facility] lift. Dx: Generalized weakness, wheelchair-bound, inclusion body myositis, recurrent falls.

## 2024-08-13 NOTE — Progress Notes (Signed)
 Found wounds on this patient with charge nurse and took pictures on media. Notified MD and MD ordered wound consult.

## 2024-08-14 ENCOUNTER — Inpatient Hospital Stay (HOSPITAL_COMMUNITY)

## 2024-08-14 DIAGNOSIS — I739 Peripheral vascular disease, unspecified: Secondary | ICD-10-CM

## 2024-08-14 DIAGNOSIS — N179 Acute kidney failure, unspecified: Secondary | ICD-10-CM | POA: Diagnosis not present

## 2024-08-14 DIAGNOSIS — R578 Other shock: Secondary | ICD-10-CM

## 2024-08-14 LAB — GLUCOSE, CAPILLARY
Glucose-Capillary: 112 mg/dL — ABNORMAL HIGH (ref 70–99)
Glucose-Capillary: 136 mg/dL — ABNORMAL HIGH (ref 70–99)
Glucose-Capillary: 67 mg/dL — ABNORMAL LOW (ref 70–99)
Glucose-Capillary: 67 mg/dL — ABNORMAL LOW (ref 70–99)
Glucose-Capillary: 74 mg/dL (ref 70–99)
Glucose-Capillary: 87 mg/dL (ref 70–99)
Glucose-Capillary: 96 mg/dL (ref 70–99)

## 2024-08-14 LAB — RENAL FUNCTION PANEL
Albumin: 3.2 g/dL — ABNORMAL LOW (ref 3.5–5.0)
Anion gap: 8 (ref 5–15)
BUN: 45 mg/dL — ABNORMAL HIGH (ref 8–23)
CO2: 33 mmol/L — ABNORMAL HIGH (ref 22–32)
Calcium: 8.4 mg/dL — ABNORMAL LOW (ref 8.9–10.3)
Chloride: 99 mmol/L (ref 98–111)
Creatinine, Ser: 2.35 mg/dL — ABNORMAL HIGH (ref 0.61–1.24)
GFR, Estimated: 28 mL/min — ABNORMAL LOW
Glucose, Bld: 154 mg/dL — ABNORMAL HIGH (ref 70–99)
Phosphorus: 4.9 mg/dL — ABNORMAL HIGH (ref 2.5–4.6)
Potassium: 4.7 mmol/L (ref 3.5–5.1)
Sodium: 140 mmol/L (ref 135–145)

## 2024-08-14 LAB — SODIUM, URINE, RANDOM: Sodium, Ur: 58 mmol/L

## 2024-08-14 LAB — URINALYSIS, ROUTINE W REFLEX MICROSCOPIC
Bilirubin Urine: NEGATIVE
Glucose, UA: NEGATIVE mg/dL
Ketones, ur: NEGATIVE mg/dL
Leukocytes,Ua: NEGATIVE
Nitrite: NEGATIVE
Protein, ur: 30 mg/dL — AB
Specific Gravity, Urine: 1.012 (ref 1.005–1.030)
pH: 5 (ref 5.0–8.0)

## 2024-08-14 LAB — POCT I-STAT 7, (LYTES, BLD GAS, ICA,H+H)
Acid-Base Excess: 5 mmol/L — ABNORMAL HIGH (ref 0.0–2.0)
Bicarbonate: 31.5 mmol/L — ABNORMAL HIGH (ref 20.0–28.0)
Calcium, Ion: 1.06 mmol/L — ABNORMAL LOW (ref 1.15–1.40)
HCT: 27 % — ABNORMAL LOW (ref 39.0–52.0)
Hemoglobin: 9.2 g/dL — ABNORMAL LOW (ref 13.0–17.0)
O2 Saturation: 99 %
Potassium: 4.1 mmol/L (ref 3.5–5.1)
Sodium: 137 mmol/L (ref 135–145)
TCO2: 33 mmol/L — ABNORMAL HIGH (ref 22–32)
pCO2 arterial: 54.1 mmHg — ABNORMAL HIGH (ref 32–48)
pH, Arterial: 7.373 (ref 7.35–7.45)
pO2, Arterial: 171 mmHg — ABNORMAL HIGH (ref 83–108)

## 2024-08-14 LAB — BLOOD CULTURE ID PANEL (REFLEXED) - BCID2
A.calcoaceticus-baumannii: NOT DETECTED
Bacteroides fragilis: NOT DETECTED
Candida albicans: NOT DETECTED
Candida auris: NOT DETECTED
Candida glabrata: NOT DETECTED
Candida krusei: NOT DETECTED
Candida parapsilosis: NOT DETECTED
Candida tropicalis: NOT DETECTED
Cryptococcus neoformans/gattii: NOT DETECTED
Enterobacter cloacae complex: NOT DETECTED
Enterobacterales: NOT DETECTED
Enterococcus Faecium: NOT DETECTED
Enterococcus faecalis: NOT DETECTED
Escherichia coli: NOT DETECTED
Haemophilus influenzae: NOT DETECTED
Klebsiella aerogenes: NOT DETECTED
Klebsiella oxytoca: NOT DETECTED
Klebsiella pneumoniae: NOT DETECTED
Listeria monocytogenes: NOT DETECTED
Meth resistant mecA/C and MREJ: NOT DETECTED
Methicillin resistance mecA/C: NOT DETECTED
Neisseria meningitidis: NOT DETECTED
Proteus species: NOT DETECTED
Pseudomonas aeruginosa: NOT DETECTED
Salmonella species: NOT DETECTED
Serratia marcescens: NOT DETECTED
Staphylococcus aureus (BCID): DETECTED — AB
Staphylococcus epidermidis: DETECTED — AB
Staphylococcus lugdunensis: NOT DETECTED
Staphylococcus species: DETECTED — AB
Stenotrophomonas maltophilia: NOT DETECTED
Streptococcus agalactiae: NOT DETECTED
Streptococcus pneumoniae: NOT DETECTED
Streptococcus pyogenes: NOT DETECTED
Streptococcus species: NOT DETECTED

## 2024-08-14 LAB — BASIC METABOLIC PANEL WITH GFR
Anion gap: 10 (ref 5–15)
BUN: 47 mg/dL — ABNORMAL HIGH (ref 8–23)
CO2: 31 mmol/L (ref 22–32)
Calcium: 8.4 mg/dL — ABNORMAL LOW (ref 8.9–10.3)
Chloride: 96 mmol/L — ABNORMAL LOW (ref 98–111)
Creatinine, Ser: 2.67 mg/dL — ABNORMAL HIGH (ref 0.61–1.24)
GFR, Estimated: 24 mL/min — ABNORMAL LOW
Glucose, Bld: 79 mg/dL (ref 70–99)
Potassium: 4.3 mmol/L (ref 3.5–5.1)
Sodium: 138 mmol/L (ref 135–145)

## 2024-08-14 LAB — MRSA NEXT GEN BY PCR, NASAL: MRSA by PCR Next Gen: NOT DETECTED

## 2024-08-14 LAB — ECHOCARDIOGRAM COMPLETE
Area-P 1/2: 3.65 cm2
Calc EF: 70.6 %
Height: 71 in
S' Lateral: 2.7 cm
Single Plane A2C EF: 76.8 %
Single Plane A4C EF: 63.2 %
Weight: 4356.29 [oz_av]

## 2024-08-14 LAB — CBC
HCT: 28 % — ABNORMAL LOW (ref 39.0–52.0)
Hemoglobin: 8.9 g/dL — ABNORMAL LOW (ref 13.0–17.0)
MCH: 34.9 pg — ABNORMAL HIGH (ref 26.0–34.0)
MCHC: 31.8 g/dL (ref 30.0–36.0)
MCV: 109.8 fL — ABNORMAL HIGH (ref 80.0–100.0)
Platelets: 74 K/uL — ABNORMAL LOW (ref 150–400)
RBC: 2.55 MIL/uL — ABNORMAL LOW (ref 4.22–5.81)
RDW: 14.7 % (ref 11.5–15.5)
WBC: 7.9 K/uL (ref 4.0–10.5)
nRBC: 0 % (ref 0.0–0.2)

## 2024-08-14 LAB — CORTISOL: Cortisol, Plasma: 14.2 ug/dL

## 2024-08-14 LAB — VAS US ABI WITH/WO TBI

## 2024-08-14 LAB — MAGNESIUM: Magnesium: 2 mg/dL (ref 1.7–2.4)

## 2024-08-14 LAB — CREATININE, URINE, RANDOM: Creatinine, Urine: 76 mg/dL

## 2024-08-14 MED ORDER — MIDODRINE HCL 5 MG PO TABS
5.0000 mg | ORAL_TABLET | Freq: Three times a day (TID) | ORAL | Status: DC
  Administered 2024-08-14: 5 mg via ORAL
  Filled 2024-08-14: qty 1

## 2024-08-14 MED ORDER — ALBUMIN HUMAN 25 % IV SOLN
12.5000 g | Freq: Once | INTRAVENOUS | Status: AC
  Administered 2024-08-14: 12.5 g via INTRAVENOUS
  Filled 2024-08-14: qty 50

## 2024-08-14 MED ORDER — SODIUM CHLORIDE 0.9 % IV BOLUS
1000.0000 mL | Freq: Once | INTRAVENOUS | Status: AC
  Administered 2024-08-14: 1000 mL via INTRAVENOUS

## 2024-08-14 MED ORDER — CALCIUM GLUCONATE-NACL 1-0.675 GM/50ML-% IV SOLN
1.0000 g | Freq: Once | INTRAVENOUS | Status: AC
  Administered 2024-08-14: 1000 mg via INTRAVENOUS
  Filled 2024-08-14: qty 50

## 2024-08-14 MED ORDER — ORAL CARE MOUTH RINSE: 15.0000 mL | OROMUCOSAL | Status: DC | PRN

## 2024-08-14 MED ORDER — SODIUM CHLORIDE 0.9 % IV SOLN
250.0000 mL | INTRAVENOUS | Status: AC
  Administered 2024-08-14: 250 mL via INTRAVENOUS

## 2024-08-14 MED ORDER — PERFLUTREN LIPID MICROSPHERE
1.0000 mL | INTRAVENOUS | Status: AC | PRN
  Administered 2024-08-14: 2 mL via INTRAVENOUS

## 2024-08-14 MED ORDER — CHLORHEXIDINE GLUCONATE CLOTH 2 % EX PADS
6.0000 | MEDICATED_PAD | Freq: Every day | CUTANEOUS | Status: DC
  Administered 2024-08-14 – 2024-08-22 (×8): 6 via TOPICAL

## 2024-08-14 MED ORDER — LACTATED RINGERS IV SOLN: INTRAVENOUS | Status: DC

## 2024-08-14 MED ORDER — NOREPINEPHRINE 4 MG/250ML-% IV SOLN
0.0000 ug/min | INTRAVENOUS | Status: DC
  Administered 2024-08-14: 8 ug/min via INTRAVENOUS
  Administered 2024-08-14: 4 ug/min via INTRAVENOUS
  Filled 2024-08-14 (×2): qty 250

## 2024-08-14 MED ORDER — THIAMINE MONONITRATE 100 MG PO TABS
100.0000 mg | ORAL_TABLET | Freq: Every day | ORAL | Status: DC
  Administered 2024-08-15 – 2024-08-22 (×8): 100 mg via ORAL
  Filled 2024-08-14 (×8): qty 1

## 2024-08-14 MED ORDER — CEFAZOLIN SODIUM-DEXTROSE 2-4 GM/100ML-% IV SOLN
2.0000 g | Freq: Three times a day (TID) | INTRAVENOUS | Status: DC
  Administered 2024-08-14 – 2024-08-22 (×23): 2 g via INTRAVENOUS
  Filled 2024-08-14 (×23): qty 100

## 2024-08-14 MED ORDER — MIDODRINE HCL 5 MG PO TABS: 10.0000 mg | ORAL_TABLET | Freq: Three times a day (TID) | ORAL | Status: DC

## 2024-08-14 NOTE — Progress Notes (Signed)
 Attempted to place pt on CPAP for night rest.  He was short of breath.  Gave him an Albuterol  PRN neb to help as well as encouraged and coached him to cough.  Patient coughed up clear fairly thick sputum.  Pt remains on 02 at this time oxygen SP02 is 98%.

## 2024-08-14 NOTE — Addendum Note (Signed)
 Addended by: FLETA CARE D on: 08/14/2024 09:34 AM   Modules accepted: Orders

## 2024-08-14 NOTE — Progress Notes (Addendum)
 Rapid Response Event Note   Reason for Call : Significant hypotension (40/30), shortness of breath, generalized malaise  Initial Focused Assessment:   On my arrival to the room, patient was pale, clammy, with [place] in place and in trendelenburg position. Patient is awake and alert, speaking in full sentences. RT at bedside finishing breathing tx. BP reading SBP of 80s. Patient neuro intact, some noted confusion/forgetfulness. O2 saturation was normal, pulse regular, NSR. Patient continually complaining of pain. Leg noted to be wrapped by outpatient wound clinic.  Interventions:  Chart reviewed for possible causes,  some interventions completed before arrival. 500 mL bolus given (EF 60-65%), 12.5 g albumin  also given. O2 titrated from 4L to 2L [name] (sat 100%). Patient placed on MX bedside monitor for q. 30 minute vital signs.  Plan of Care:  Monitor, continue with fluid resuscitation, repeat interventions as needed. If BP does not improve, will transfer patient to ICU/SD unit for increased monitoring and possible medication management for patient's hypotension. Discussed management with APP Alto, NP - in agreement with plan.   Event Summary:    MD Notified: A. Alto, NP Call Time: 0114 Arrival Time:0120 End Time:0200    0303: Received call from patient's primary nurse. Patient's BP noted to have dropped back into the 80s with a MAP of 65. Discussed reaching out to  possibly repeat intervention.   0330: Repeating interventions. 1L NS bolus and 12.5g albumin  given. Patient responsive to ongoing interventions.   0515: BP labile, will attempt midodrine 

## 2024-08-14 NOTE — Consult Note (Addendum)
" °  CLINICAL SUPPORT TEAM - WOUND OSTOMY AND CONTINENCE TEAM  CONSULTATION SERVICES   WOC Nurse-Inpatient Note    WOC Nurse Consult Note: Reason for Consult: Requested to assess a sacral wound and RLE. Wound type: DTPI to sacrum and RLE follow by lymphedema outpatient clinic. Pressure Injury POA: Yes  Patient was assessed byt WOC tem yesterday, plea see notes.  Dressing procedure/placement/frequency: Follow the procedures below Cleanse sacrum/coccyx with NS, apply a small piece of Aquacel AG silver hydrofiber to coccyx and secure with silicone foam.  Apply a thin layer of Desitin to buttocks 2 times daily and prn soiling.  Cleanse B ischium/posterior thighs with NS, apply Xeroform gauze to areas daily and secure with silicone foam.   Right leg wrapped - Patient is followed at wound care center for lymphedema and ulcerations to right leg; last seen 1/16 with silver hydrofiber and compression wrap applied; this is performed weekly at [facility] due next 08/18/2023.   WOC team will reassess if patient remains inpatient.    *I spoke with the attending about a requesting an ABI to a possible Unna boot treatment. No recent ABI was found. Will order.  WOC team will follow FRI regarding his right leg wounds. Please reconsult if further assistance is needed. Thank-you,  Lela Holm MSN, RN, CWCN, CNS.  (Phone [phone])         "

## 2024-08-14 NOTE — Progress Notes (Addendum)
 " PROGRESS NOTE    [name]  FMW:[id number] DOB: 1951-06-20 DOA: 08/13/2024 PCP: Candise Aleene DEL, MD    Brief Narrative:   [name] is a 74 y.o. adult with past medical history significant for inclusion body myositis, HTN, history of DVT on Eliquis , chronic diastolic congestive heart failure, venous insufficiency with chronic bilateral lower extremity edema, right lower extremity wounds (follows with wound care clinc), chronic hypoxic/hypercarbic respiratory failure, OSA, CAD, HLD, obesity who presented to [facility] ED on 08/13/2024 from home via EMS due to fall, apparently slid out of his bed, increased weakness.  Patient denied loss of consciousness, did not strike his head.  Recently discharged from SNF a few days prior.  Reports continue to take his prescribed occasions but has been taking a few extra doses of his torsemide  over the last few days to attempt to improve his lower extremity edema.  In the ED, temperature 97.5 F, HR 71, RR 70/35, SpO2 73% on room air.  WBC 9.3, hemoglobin 10.9, platelet count 100.  Sodium 138, potassium 4.8, chloride 92, CO2 34, glucose 96, BUN 49, creat 3.14.  AST 82, ALT 20, total bilirubin 0.6.  BNP 713.0.  High-sensitivity troponin 306 followed by 261.  Lactic acid 2.2 followed by 1.6.  Chest x-ray with persistent low lung volumes with chronic elevation of the right hemidiaphragm and increased right basilar atelectasis.  Blood cultures x 2 obtained.  EKG with sinus rhythm, rate 60, QTc 501, no concerning ST elevation/depressions or T wave inversions.  Patient received 1.5 L LR bolus in the ED.  TRH consulted for admission for further evaluation and management of fall, weakness, hypotension.  Assessment & Plan:   Shock, undifferentiated Patient presenting after fall from home, apparently slid out of his bed.  Recently discharged from SNF a few days prior.  Recently admitted for septic shock secondary to lower extremity cellulitis/wounds.  Patient is  afebrile without leukocytosis.  Lactic acid elevated 2.2.  Blood pressure 70/35.  Apparently has been taking additional doses of his oral diuretic to help with his lower extremity edema over the last few days.  Suspect etiology likely secondary to hypovolemia versus cardiogenic.  Patient received 3.5 L of aggressive IV fluid resuscitation, 25 g of IV albumin  and started on midodrine  without significant improvement of blood pressure. -- Transfer to ICU -- Consulting PCCM -- Blood cultures x 2: pending -- Holding home atenolol , lisinopril , tamsulosin , torsemide  -- Increase midodrine  to 10 mg p.o. 3 times daily -- Start peripheral norepinephrine ; titrate to keep MAP greater than 65 -- Limited echo to review LVEF (was 60-65% Dec 2025)  Addendum 1700: Message from pharmacist, blood cultures 2 out of 4 positive for Staphylococcus left hand, with BC ID staph epi.  No mecA/resistance --Start cefazolin   Acute renal failure Creatinine 3.14 on admission.  Was 0.64 on 07/20/2024.  Likely secondary to prerenal azotemia from excessive diuretic use versus ATN from hypotension.  Received aggressive IV fluid resuscitation and IV albumin  on admission. -- Cr 3.14>2.67 -- Holding home lisinopril  and atenolol  -- Place Foley catheter for monitoring of urine output -- Strict I's and O's  Elevated LFTs AST elevated 82, ALT 20.  Unclear etiology, suspect mild shock liver due to hypotension as above. -- Holding home statin -- CMP in a.m.  HTN Chronic diastolic congestive heart failure, compensated -- Hold home atenolol , torsemide  and lisinopril  -- Strict I's and O's and daily weights  Peripheral vascular disease Venous insufficiency Right lower extremity wounds Sacral wound Patient  follows with wound clinic outpatient, Dr. Marolyn Wound 07/10/24 Pressure Injury Buttocks Right;Left Deep Tissue Pressure Injury - Purple or maroon localized area of discolored intact skin or blood-filled blister due to damage of  underlying soft tissue from pressure and/or shear. (Active)     Wound 08/13/24 1840 Pressure Injury Coccyx Mid Stage 2 -  Partial thickness loss of dermis presenting as a shallow open injury with a red, pink wound bed without slough. (Active)     Wound 08/13/24 1840 Pressure Injury Buttocks Right;Left;Medial Deep Tissue Pressure Injury - Purple or maroon localized area of discolored intact skin or blood-filled blister due to damage of underlying soft tissue from pressure and/or shear. (Active)     Wound 08/13/24 1841 Pressure Injury Ischial tuberosity Right;Left Deep Tissue Pressure Injury - Purple or maroon localized area of discolored intact skin or blood-filled blister due to damage of underlying soft tissue from pressure and/or shear. (Active)  -- Wound RN consulted -- Check ABI -- Cleanse sacrum/coccyx with NS, apply a small piece of Aquacel AG silver hydrofiber to coccyx and secure with silicone foam.  -- Apply a thin layer of Desitin to buttocks 2 times daily and prn soiling.  -- Cleanse B ischium/posterior thighs with NS, apply Xeroform gauze to areas daily and secure with silicone foam.    Right leg wrapped - Patient is followed at wound care center for lymphedema and ulcerations to right leg; last seen 1/16 with silver hydrofiber and compression wrap applied; this is performed weekly at [facility] due next 08/18/2023.   History of DVT -- Eliquis  2.5 m p.o. twice daily  CAD HLD -- Zetia  10 mg p.o. daily -- Hold home Crestor  due to elevated LFTs  OSA Patient was to be on BiPAP after discharge from SNF.  Family working on obtaining device.  Has been off since. -- Check ABG -- BiPAP  BPH -- Holding tamsulosin  due to hypotension as above  Obesity, class II Body mass index is 37.97 kg/m.    DVT prophylaxis: apixaban  (ELIQUIS ) tablet 2.5 mg Start: 08/13/24 2200 apixaban  (ELIQUIS ) tablet 2.5 mg    Code Status: Full Code Family Communication:   Disposition Plan:  Level of care:  ICU Status is: Inpatient Remains inpatient appropriate because: Transfer to ICU for hypotension/shock resistant to fluid/volume resuscitation    Consultants:  PCCM  Procedures:  Limited TTE: Pending  Antimicrobials:  None   Subjective: Patient seen examined bedside, lying in bed.  Sleeping but arousable.  Remains hypotensive despite aggressive IV fluid resuscitation and volume expansion with IV albumin , also started on midodrine .  Discussed with RN, transfer to ICU.  Starting peripheral norepinephrine , increase midodrine  to 10 mg p.o. 3 times daily.  Consulted PCCM.  Patient complains of generalized weakness and shortness of breath, no other specific complaints.  Discussed with daughter who is a teacher, early years/pre at bedside.  Patient with no other questions or concerns.  Denies headache, no chest pain, no palpitations, no abdominal pain, no fever/chills, no nausea/vomiting/diarrhea.  Objective: Vitals:   08/14/24 0702 08/14/24 0704 08/14/24 0730 08/14/24 0745  BP: (!) 70/43 (!) 80/43 (!) 92/33   Pulse: 77 75 (!) 31   Resp: 18 18 18    Temp:    (!) 97.2 F (36.2 C)  TempSrc:    Oral  SpO2: 100% 100% 100%   Weight:      Height:        Intake/Output Summary (Last 24 hours) at 08/14/2024 1127 Last data filed at 08/14/2024 0640 Gross per 24  hour  Intake 2665.58 ml  Output 500 ml  Net 2165.58 ml   Filed Weights   08/13/24 1842  Weight: 123.5 kg    Examination:  Physical Exam: GEN: NAD, alert and oriented x 3, obese, chronically ill in appearance HEENT: NCAT, PERRL, EOMI, sclera clear, MMM PULM: Breath sounds slight diminished our bases, no wheezes/crackles, normal respiratory effort without accessory muscle use, on 4 L nasal cannula with pO2 100% at rest CV: RRR w/o M/G/R GI: abd soft, NTND, + BS MSK: + LE peripheral edema, RLE with Unna boot in place, extremities with multiple areas of ecchymosis, chronic venous changes in various stages of healing NEURO: No focal neurologic  deficit PSYCH: normal mood/affect Integumentary: Right lower extremity wound, sacral wound, multiple areas of ecchymosis to extremities in various stages of healing         Data Reviewed: I have personally reviewed following labs and imaging studies  CBC: Recent Labs  Lab 08/13/24 1234 08/14/24 0352 08/14/24 1025  WBC 9.3 7.9  --   HGB 10.9* 8.9* 9.2*  HCT 35.0* 28.0* 27.0*  MCV 111.1* 109.8*  --   PLT 100* 74*  --    Basic Metabolic Panel: Recent Labs  Lab 08/13/24 1234 08/14/24 0352 08/14/24 1025  NA 138 138 137  K 4.8 4.3 4.1  CL 92* 96*  --   CO2 34* 31  --   GLUCOSE 96 79  --   BUN 49* 47*  --   CREATININE 3.14* 2.67*  --   CALCIUM  9.2 8.4*  --    GFR: Estimated Creatinine Clearance (by C-G formula based on SCr of 2.67 mg/dL (H)) Male: 26 mL/min (A) Male: 31.5 mL/min (A) Liver Function Tests: Recent Labs  Lab 08/13/24 1234  AST 82*  ALT 20  ALKPHOS 92  BILITOT 0.6  PROT 6.3*  ALBUMIN  3.6   No results for input(s): LIPASE, AMYLASE in the last 168 hours. No results for input(s): AMMONIA in the last 168 hours. Coagulation Profile: No results for input(s): INR, PROTIME in the last 168 hours. Cardiac Enzymes: No results for input(s): CKTOTAL, CKMB, CKMBINDEX, TROPONINI in the last 168 hours. BNP (last 3 results) Recent Labs    04/10/24 1338 07/09/24 1230 08/13/24 1234  PROBNP 549* 909.0* 713.0*   HbA1C: No results for input(s): HGBA1C in the last 72 hours. CBG: Recent Labs  Lab 08/14/24 0134  GLUCAP 87   Lipid Profile: No results for input(s): CHOL, HDL, LDLCALC, TRIG, CHOLHDL, LDLDIRECT in the last 72 hours. Thyroid  Function Tests: No results for input(s): TSH, T4TOTAL, FREET4, T3FREE, THYROIDAB in the last 72 hours. Anemia Panel: No results for input(s): VITAMINB12, FOLATE, FERRITIN, TIBC, IRON, RETICCTPCT in the last 72 hours. Sepsis Labs: Recent Labs  Lab 08/13/24 1241  08/13/24 1738  LATICACIDVEN 2.2* 1.6    Recent Results (from the past 240 hours)  Blood culture (routine x 2)     Status: None (Preliminary result)   Collection Time: 08/13/24 12:28 PM   Specimen: BLOOD  Result Value Ref Range Status   Specimen Description   Final    BLOOD RIGHT ANTECUBITAL Performed at [facility], [address], [place], [place] [address]    Special Requests   Final    BOTTLES DRAWN AEROBIC AND ANAEROBIC Blood Culture adequate volume Performed at [facility], [address] [place], [place] [address]    Culture   Final    NO GROWTH < 24 HOURS Performed at [facility], [address] [place], [place] [address]    Report Status PENDING  Incomplete  Blood culture (routine x 2)     Status: None (Preliminary result)   Collection Time: 08/13/24  5:38 PM   Specimen: BLOOD  Result Value Ref Range Status   Specimen Description   Final    BLOOD BLOOD LEFT HAND Performed at [facility], [address], [place], [place] [address]    Special Requests   Final    BOTTLES DRAWN AEROBIC AND ANAEROBIC Blood Culture adequate volume Performed at [facility], [address], [place], [place] [address]    Culture   Final    NO GROWTH < 12 HOURS Performed at [facility], [address], [place], [place] [address]    Report Status PENDING  Incomplete  MRSA Next Gen by PCR, Nasal     Status: None   Collection Time: 08/14/24  8:45 AM   Specimen: Nasal Mucosa; Nasal Swab  Result Value Ref Range Status   MRSA by PCR Next Gen NOT DETECTED NOT DETECTED Final    Comment: (NOTE) The GeneXpert MRSA Assay (FDA approved for NASAL specimens only), is one component of a comprehensive MRSA colonization surveillance program. It is not intended to diagnose MRSA infection nor to guide or monitor treatment for MRSA infections. Test performance is not FDA approved in patients less than 69  years old. Performed at [facility], [address], [place], [place] [address]          Radiology Studies: DG CHEST PORT 1 VIEW Result Date: 08/14/2024 CLINICAL DATA:  Shortness of breath. EXAM: PORTABLE CHEST 1 VIEW COMPARISON:  August 13, 2024 FINDINGS: Stable cardiomegaly. Elevated right hemidiaphragm is noted. Minimal bibasilar subsegmental atelectasis is noted with small pleural effusions. IMPRESSION: Minimal bibasilar subsegmental atelectasis with small pleural effusions. Electronically Signed   By: Lynwood Landy Raddle M.D.   On: 08/14/2024 10:48   DG Chest 1 View Result Date: 08/13/2024 CLINICAL DATA:  Shortness of breath. EXAM: CHEST  1 VIEW COMPARISON:  07/17/2024. FINDINGS: Persistent low lung volumes with chronic elevation of the right hemidiaphragm and increased right basilar atelectasis. Left lung appears clear. No pneumothorax. Stable cardiomegaly. No acute osseous abnormality. IMPRESSION: Persistent low lung volumes with chronic elevation of the right hemidiaphragm and increased right basilar atelectasis. Electronically Signed   By: Harrietta Sherry M.D.   On: 08/13/2024 13:54        Scheduled Meds:  apixaban   2.5 mg Oral BID   Chlorhexidine  Gluconate Cloth  6 each Topical Daily   ezetimibe   10 mg Oral QPM   liver oil-zinc  oxide  1 Application Topical BID   Continuous Infusions:  sodium chloride      lactated ringers  125 mL/hr at 08/14/24 0529   norepinephrine  (LEVOPHED ) Adult infusion 4 mcg/min (08/14/24 0952)     LOS: 1 day   Critical Care Time Upon my evaluation, this patient had a high probability of imminent or life-threatening deterioration due to undifferentiated shock likely secondary to hypovolemic versus cardiogenic, which required my direct attention, intervention, and personal management.  I have personally provided 45 minutes of critical care time exclusive of my time spent on separately billable procedures.  Time includes review of  laboratory data, radiology results, discussion with consultants, and monitoring for potential decompensation.       Camellia PARAS Demarqus Jocson, DO [facility] Available via Epic secure chat 7am-7pm After these hours, please refer to coverage  provider listed on [email or web address] 08/14/2024, 11:27 AM   "

## 2024-08-14 NOTE — Consult Note (Signed)
 "  NAME:  [name], MRN:  [id number], DOB:  04-27-1951, LOS: 1 ADMISSION DATE:  08/13/2024, CONSULTATION DATE:  08/14/24 REFERRING MD:  Dr. Austria, CHIEF COMPLAINT:  hypotension   History of Present Illness:   74 year old male with past medical history of chronic hypoxic and hypercarbic respiratory failure, inclusion body myositis, OSA, CAD, HFpEF, HTN, HLD, chronic radicular lumbar pain, DVT on eliquis , chronic lymphedema with RLE wound followed at wound care clinic, obesity.  Recent hospitalization 12/15- 12/26 for HFpEF exacerbation complicated by AKI and septic shock 2/2 RLE cellulitis.  Additionally found to have elevation of R hemidiaphragm and given deconditioning and weakness was discharged home on nightly NIV and 2-4L O2 to rehab.  Daughter at bedside also able to provide some background.  Reports stopped wearing NIV at rehab around 1 week ago due to pt and facility confusion.  Discharged home 2 days ago, mostly bed bound at baseline, and pt started taking increased doses of his torsemide  because he was not urinating as much.  Not eating as much but drinking well.  Pt states when he lays flat he has SOB but otherwise denied any pain, recent URI, abnormal cough/ sputum production, N/V/D, dysphagia, chills, fever, leg pain or worsening swelling, dysuria but slight burning at tip of meatus.  Slid out of bed, not hitting head, otherwise asymptomatic.    In ER, found afebrile, stable O2 levels on 4L,  hypotensive with SBP in 70's with AKI with improved BP after fluids.  Admitted to [facility],  however progressively hypotensive overnight despite midodrine , cumulative total of 3.5L of fluid and Albumin  25gm, with HR in 50's.  Started on peripheral NE and PCCM consulted for further evaluation and management of hypotension.    Pertinent  Medical History  chronic hypoxic and hypercarbic respiratory failure, OSA, inclusion body myositis, OSA, CAD, HFpEF, HTN, HLD, chronic radicular lumbar pain, DVT on eliquis ,  chronic lymphedema with RLE wound followed at wound care clinic, obesity  Significant Hospital Events: Including procedures, antibiotic start and stop dates in addition to other pertinent events   1/19 admitted hypotension, AKI  Interim History / Subjective:  Currently asymptomatic Daughter at bedside, is night pharmacist   Objective    Blood pressure (!) 92/33, pulse (!) 31, temperature (!) 97.2 F (36.2 C), temperature source Oral, resp. rate 18, height 5' 11 (1.803 m), weight 123.5 kg, SpO2 100%.        Intake/Output Summary (Last 24 hours) at 08/14/2024 1027 Last data filed at 08/14/2024 9359 Gross per 24 hour  Intake 2665.58 ml  Output 500 ml  Net 2165.58 ml   Filed Weights   08/13/24 1842  Weight: 123.5 kg   Examination: General:  Elderly male sitting upright in bed in NAD HEENT: MM pink/moist, pupils 3/r, very HOH, best out of right ear Neuro: resting, easily awakens to verbal, oriented x 3, MAE-limited in RLE given dressing/e dema CV: irir- ?SR with PAC's, rates 50-70s, +1 pulses PULM:  non labored, diminished, no wheeze GI: obese, +bs, NT, purwick Extremities: warm/dry, trace LLE, RLE wrapped - some chronic edema Skin: scattered ecchymosis to BUE  afebrile  Labs> Na 138, K 4.3, Cl 96, bicarb 31, glucose 79, BUN/ sCr 49/ 3.14> 47/ 2.67, trop hs 306> 261, lactic 2.2> 1.6, WBC 7.9, H/H 10.9/ 35> 8.9/ 28, plts 100> 74  ABG on 3L> 7.373/ 54/ 171/ 31.5  Resolved problem list   Assessment and Plan   Shock, undifferentiated  Bradycardia AKI  Hx BPH - previous TTE  12/25 EF 60-65% - baseline sCr 0.6-0.9, previously 0.64 on 12/26,  - likely in setting of of decreased PO intake, diuretics, AECi, and BB P:  - ICU monitoring  - cont with peripheral NE for MAP goal > 65 - stop midodrine  with bradycardia - s/p 3.5L fluids and albumin , hold MIVF for now - lactic cleared 1/19 - remains afebrile/ normal WBC, no infectious symptoms currently.  Continue to monitor off  abx, follow cultures  - EKG> RBBB, hx of.  Cont tele monitoring.  Unclear if taking reduced dose atenolol  25mg  daily vs BID (daily was recommended at last discharge)  - optimize electrolytes - repeat limited echo pending  - place foley> ?retention given hx BPH - renal indices improving from yesterday.  Consider rechecking renal US  if worsening, given prior right new moderate hydronephrosis 12/15.  Sending renal lytes - repeat BMET at 1600 and check cortisol  - strict I/Os - hold flomax  while on pressors   Chronic hypoxic and hypercarbic respiratory failure OSA - CXR with atelectasis, known R hemidiaphragm elevation  - baseline HOT 2-4L, supposed to be on  home NIV P:  - stable oxygenation and ABG.  With CO2 54> confirms pt will need nightly and prn naps NIV, likely in setting of deconditioning, neuromuscular weakness in setting of inclusion body myositis - O2 goal > 90- 94% - BiPAP w/ naps and nightly  - PT - pulm hygiene- IS, flutter   HTN HFrEF CAD - cont to hold pta atenolol  25mg  daily, lisinopril  10mg  daily, torsemide  20 daily with hypotension/ vasopressor support - repeat limited echo pending   HLD - cont statin   Elevated LFTs - AST 82, ALT 20.  Hx of some ETOH use.  Daughter reports drank 2 beers on Saturday but otherwise no regular ETOH use with prior hospitalization with [facility]WA monitored and rehab stay P:  - repeat LFTs in am - empiric thiamine , folate high on 12/18   Hx DVT - cont eliquis  2.5mg  BID   Chronic anemia and thrombocytopenia - stable, trend on CBC   RLE wound and Sacral wound, POA 2/2 PVD - WOC - ABI ordered    Pt and daughter updated at bedside  Labs   CBC: Recent Labs  Lab 08/13/24 1234 08/14/24 0352  WBC 9.3 7.9  HGB 10.9* 8.9*  HCT 35.0* 28.0*  MCV 111.1* 109.8*  PLT 100* 74*    Basic Metabolic Panel: Recent Labs  Lab 08/13/24 1234 08/14/24 0352  NA 138 138  K 4.8 4.3  CL 92* 96*  CO2 34* 31  GLUCOSE 96 79  BUN 49*  47*  CREATININE 3.14* 2.67*  CALCIUM  9.2 8.4*   GFR: Estimated Creatinine Clearance (by C-G formula based on SCr of 2.67 mg/dL (H)) Male: 26 mL/min (A) Male: 31.5 mL/min (A) Recent Labs  Lab 08/13/24 1234 08/13/24 1241 08/13/24 1738 08/14/24 0352  WBC 9.3  --   --  7.9  LATICACIDVEN  --  2.2* 1.6  --     Liver Function Tests: Recent Labs  Lab 08/13/24 1234  AST 82*  ALT 20  ALKPHOS 92  BILITOT 0.6  PROT 6.3*  ALBUMIN  3.6   No results for input(s): LIPASE, AMYLASE in the last 168 hours. No results for input(s): AMMONIA in the last 168 hours.  ABG    Component Value Date/Time   PHART 7.341 (L) 07/12/2024 1733   PCO2ART 59.9 (H) 07/12/2024 1733   PO2ART 69 (L) 07/12/2024 1733   HCO3 35.4 (H) 07/14/2024  1055   TCO2 34 (H) 07/12/2024 1733   O2SAT 92.3 07/14/2024 1055     Coagulation Profile: No results for input(s): INR, PROTIME in the last 168 hours.  Cardiac Enzymes: No results for input(s): CKTOTAL, CKMB, CKMBINDEX, TROPONINI in the last 168 hours.  HbA1C: Hgb A1c MFr Bld  Date/Time Value Ref Range Status  05/05/2023 03:14 PM 5.3 4.8 - 5.6 % Final    Comment:             Prediabetes: 5.7 - 6.4          Diabetes: >6.4          Glycemic control for adults with diabetes: <7.0   04/05/2022 12:25 PM 5.2 4.8 - 5.6 % Final    Comment:             Prediabetes: 5.7 - 6.4          Diabetes: >6.4          Glycemic control for adults with diabetes: <7.0     CBG: Recent Labs  Lab 08/14/24 0134  GLUCAP 87    Review of Systems:   HPI negative except for orthopnea, chronic lower extremity swelling   Past Medical History:  He,  has a past medical history of Adenomatous colon polyp - diminutive (06/2019), Allergy (sinus), Bilateral lower extremity edema, BPH with lower urinary tract symptoms without urinary obstruction, Cataract, Coronary artery disease, DVT (deep venous thrombosis) (HCC), Dyspnea, Fungal infection of lung, Glaucoma, Hay  fever, Hemidiaphragm paralysis, HTN (hypertension), Hyperlipidemia, Inclusion body myositis (HCC), Macrocytic anemia, Osteoarthritis, multiple sites, Palatal fistula, Palpable abd. aorta, RBBB, Spinal stenosis, and Thrombocytopenia.   Surgical History:   Past Surgical History:  Procedure Laterality Date   APPENDECTOMY  2010   BONE MARROW BIOPSY     2025 benign   CERVICAL FUSION  09/2009   CHOLECYSTECTOMY  1998   COLONOSCOPY     2020-->no recall   EYE SURGERY     Lasik   HYDROCELE EXCISION  2005   JOINT REPLACEMENT  05/22/20   right hip   LEFT HEART CATHETERIZATION WITH CORONARY ANGIOGRAM N/A 06/24/2014   Procedure: LEFT HEART CATHETERIZATION WITH CORONARY ANGIOGRAM;  Surgeon: Debby DELENA Sor, MD;  Location: [facility] CATH LAB;  Service: Cardiovascular;  Laterality: N/A;   LUMBAR SPINE SURGERY  11/2011   MUSCLE BIOPSY Left 07/11/2018   Procedure: Left Vastus lateralus muscle biopsy;  Surgeon: Cheryle Debby DELENA, MD;  Location: [facility] OR;  Service: Neurosurgery;  Laterality: Left;  Left Vastus lateralus muscle biopsy   PERCUTANEOUS CORONARY STENT INTERVENTION (PCI-S)  06/24/2014   DES to RCA. Procedure: PERCUTANEOUS CORONARY STENT INTERVENTION (PCI-S);  Surgeon: Debby DELENA Sor, MD;  Location: [facility] CATH LAB;  Service: Cardiovascular;;  Distal RCA   Rhythm monitoring     04/2023, occ pvc's   SPINE SURGERY     TONSILLECTOMY AND ADENOIDECTOMY  2012   TOTAL HIP ARTHROPLASTY Right 2021   RIGHT   TRANSTHORACIC ECHOCARDIOGRAM     03/2019 normal.  Nov 2024 mild RV dysf, o/w normal     Social History:   reports that he quit smoking about 35 years ago. His smoking use included cigarettes. He started smoking about 58 years ago. He has a 17.3 pack-year smoking history. He has never used smokeless tobacco. He reports current alcohol use. He reports that he does not use drugs.   Family History:  His family history includes Aneurysm in his father; Cancer in his mother; Cystic fibrosis in his  brother; Early  death in his brother and father; Esophageal cancer in his mother; Heart attack in his mother; Heart disease in his father. There is no history of Stroke.   Allergies Allergies[1]   Home Medications  Prior to Admission medications  Medication Sig Start Date End Date Taking? Authorizing Provider  acetaminophen  (TYLENOL ) 325 MG tablet Take 2 tablets (650 mg total) by mouth every 6 (six) hours as needed (as needed for pain). 07/20/24  Yes Arrien, Elidia Sieving, MD  apixaban  (ELIQUIS ) 2.5 MG TABS tablet Take 1 tablet (2.5 mg total) by mouth 2 (two) times daily. 07/04/23  Yes Okey Vina GAILS, MD  atenolol  (TENORMIN ) 25 MG tablet Take 25 mg by mouth 2 (two) times daily.   Yes [provider]  b complex vitamins tablet Take 1 tablet by mouth every evening.   Yes [provider]  Cholecalciferol (VITAMIN D-3 PO) Take 1 capsule by mouth daily.   Yes [provider]  Coenzyme Q10 (COQ-10 PO) Take 1 capsule by mouth every evening.   Yes [provider]  eszopiclone  3 MG TABS Take 3 mg by mouth at bedtime as needed (for sleep- take immediately before bedtime).   Yes [provider]  ezetimibe  (ZETIA ) 10 MG tablet Take 1 tablet (10 mg total) by mouth daily. Patient taking differently: Take 10 mg by mouth every evening. 05/21/24  Yes Conte, Tessa N, PA-C  fluticasone  (FLONASE ) 50 MCG/ACT nasal spray Place 1 spray into both nostrils 2 (two) times daily as needed for allergies or rhinitis.   Yes [provider]  folic acid  (FOLVITE ) 400 MCG tablet Take 400-800 mcg by mouth daily.   Yes [provider]  furosemide  (LASIX ) 40 MG tablet Take 40 mg by mouth See admin instructions. Take 40 mg by mouth every other morning   Yes [provider]  KRILL OIL PO Take 1 capsule by mouth daily.   Yes [provider]  levocetirizine (XYZAL ) 5 MG tablet Take 5 mg by mouth See admin instructions. Take 5 mg by mouth in the evening as needed for  allergies   Yes [provider]  lisinopril  (ZESTRIL ) 10 MG tablet Take 10 mg by mouth daily.   Yes [provider]  potassium chloride  (KLOR-CON ) 10 MEQ tablet Take 10 mEq by mouth every other day.   Yes [provider]  rosuvastatin  (CRESTOR ) 5 MG tablet Take 5 mg by mouth daily.   Yes [provider]  tamsulosin  (FLOMAX ) 0.4 MG CAPS capsule Take 1 capsule (0.4 mg total) by mouth daily. Patient taking differently: Take 0.4 mg by mouth every evening. 04/24/24  Yes McGowen, Aleene DEL, MD  atenolol  (TENORMIN ) 25 MG tablet Take 1 tablet (25 mg total) by mouth daily. Patient not taking: Reported on 08/13/2024 07/21/24   Arrien, Elidia Sieving, MD  feeding supplement (ENSURE PLUS HIGH PROTEIN) LIQD Take 237 mLs by mouth 3 (three) times daily between meals. Patient not taking: Reported on 08/13/2024 07/20/24   Arrien, Mauricio Daniel, MD  Multiple Vitamin (MULTIVITAMIN WITH MINERALS) TABS tablet Take 1 tablet by mouth daily. Patient not taking: Reported on 08/13/2024 07/21/24   Arrien, Mauricio Daniel, MD  nutrition supplement, JUVEN, (JUVEN) PACK Take 1 packet by mouth 2 (two) times daily between meals. Patient not taking: Reported on 08/13/2024 07/20/24   Arrien, Mauricio Daniel, MD  polyethylene glycol (MIRALAX  / GLYCOLAX ) 17 g packet Take 17 g by mouth daily. Patient not taking: Reported on 08/13/2024 07/21/24   Arrien, Mauricio  Toribio, MD  torsemide  (DEMADEX ) 20 MG tablet Take 1 tablet (20 mg total) by mouth daily. Patient not taking: Reported on 08/13/2024 07/21/24   Arrien, Elidia Toribio, MD         CRITICAL CARE Performed by: Lyle Pesa   Total critical care time: 50 minutes  Critical care time was exclusive of separately billable procedures and treating other patients.  Critical care was necessary to treat or prevent imminent or life-threatening deterioration.  Critical care was time spent personally by me on the following activities:  development of treatment plan with patient and/or surrogate as well as nursing, discussions with consultants, evaluation of patient's response to treatment, examination of patient, obtaining history from patient or surrogate, ordering and performing treatments and interventions, ordering and review of laboratory studies, ordering and review of radiographic studies, pulse oximetry and re-evaluation of patient's condition.    Lyle Pesa, NP [facility] 08/14/2024, 10:27 AM  See Amion for pager If no response to pager , please call [phone] until 7pm After 7:00 pm call Elink  [phone]            [1] No Known Allergies  "

## 2024-08-14 NOTE — Progress Notes (Signed)
" °   08/14/24 2100  BiPAP/CPAP/SIPAP  BiPAP/CPAP/SIPAP Pt Type Adult  BiPAP/CPAP/SIPAP SERVO  Mask Type Full face mask  Dentures removed? Yes - Placed in denture cup  Mask Size Large  Set Rate 15 breaths/min  Respiratory Rate 18 breaths/min  IPAP 10 cmH20  EPAP 5 cmH2O  FiO2 (%) 40 %  Minute Ventilation 9.1  Leak 26  Peak Inspiratory Pressure (PIP) 10  Tidal Volume (Vt) 551  Patient Home Machine No  Patient Home Mask No  Patient Home Tubing No  Auto Titrate No  Press High Alarm 25 cmH2O  CPAP/SIPAP surface wiped down Yes  Device Plugged into RED Power Outlet Yes  BiPAP/CPAP /SiPAP Vitals  Pulse Rate 80  Resp 18  BP (!) 116/43  SpO2 100 %  MEWS Score/Color  MEWS Score 0  MEWS Score Color Green    "

## 2024-08-14 NOTE — Progress Notes (Signed)
 2D echo attempted, RN asked for echo to be done later.

## 2024-08-14 NOTE — Progress Notes (Signed)
 PT Cancellation Note  Patient Details Name: [name] MRN: [id number] DOB: [birth year]   Cancelled Treatment:    Reason Eval/Treat Not Completed: Patient not medically ready;Medical issues which prohibited therapy. Pt with change in medical status, RR called overnight with BP 40/30. PT transferred to ICU for medical management of hypotension. PT order cancelled at this time as pt is not medically stable and appropriate for skilled therapy intervention. Please re-order when pt is medically stable and able to participate. Thank you.  Isaiah DEL. Nikash Mortensen, PT, DPT  [employer] 08/14/2024, 9:12 AM

## 2024-08-14 NOTE — Telephone Encounter (Signed)
 DME order completed and pt's wife advised this prescription was printed and will be mailed to the address on file.

## 2024-08-14 NOTE — Progress Notes (Signed)
 2D echo attempted, patient getting vascular procedure. Will try later

## 2024-08-14 NOTE — Progress Notes (Signed)
 OT Cancellation Note  Patient Details Name: Criston Chancellor MRN: [id number] DOB: 07/22/1951   Cancelled Treatment:    Reason Eval/Treat Not Completed: Patient not medically ready.  Pt with recent medical status change due to hypotension with recent transfer to ICU. OT order cancelled as pt is not medically ready to participate in skilled therapy at this time. Please re-order OT when pt is medically stable and able to participate. Thank you for the referral.    Leita Howell, OTR/L,CBIS  Supplemental OT - [facility] Preferred   08/14/2024, 8:40 AM

## 2024-08-14 NOTE — Progress Notes (Signed)
 ABI exam has been completed.   Results can be found under chart review under CV PROC. 08/14/2024 3:27 PM Cleotha Whalin RVT, RDMS

## 2024-08-14 NOTE — Progress Notes (Signed)
 PHARMACY - PHYSICIAN COMMUNICATION CRITICAL VALUE ALERT - BLOOD CULTURE IDENTIFICATION (BCID)  Doniven Vanpatten is an 74 y.o. adult who presented to [facility] on 08/13/2024 with a chief complaint of fall. PMH significant for recent admission with septic shock secondary to lower extremity cellulitis/wounds.   Assessment:  BCID +2/4 staph aureus, staph epi; no mecA detected  Name of physician (or Provider) Contacted: Dr. Austria, Dr. Dennise since staph aureus generates auto-ID consult  Current antibiotics: None  Changes to prescribed antibiotics recommended: Initiate cefazolin   Results for orders placed or performed during the hospital encounter of 08/13/24  Blood Culture ID Panel (Reflexed) (Collected: 08/13/2024  5:38 PM)  Result Value Ref Range   Enterococcus faecalis NOT DETECTED NOT DETECTED   Enterococcus Faecium NOT DETECTED NOT DETECTED   Listeria monocytogenes NOT DETECTED NOT DETECTED   Staphylococcus species DETECTED (A) NOT DETECTED   Staphylococcus aureus (BCID) DETECTED (A) NOT DETECTED   Staphylococcus epidermidis DETECTED (A) NOT DETECTED   Staphylococcus lugdunensis NOT DETECTED NOT DETECTED   Streptococcus species NOT DETECTED NOT DETECTED   Streptococcus agalactiae NOT DETECTED NOT DETECTED   Streptococcus pneumoniae NOT DETECTED NOT DETECTED   Streptococcus pyogenes NOT DETECTED NOT DETECTED   A.calcoaceticus-baumannii NOT DETECTED NOT DETECTED   Bacteroides fragilis NOT DETECTED NOT DETECTED   Enterobacterales NOT DETECTED NOT DETECTED   Enterobacter cloacae complex NOT DETECTED NOT DETECTED   Escherichia coli NOT DETECTED NOT DETECTED   Klebsiella aerogenes NOT DETECTED NOT DETECTED   Klebsiella oxytoca NOT DETECTED NOT DETECTED   Klebsiella pneumoniae NOT DETECTED NOT DETECTED   Proteus species NOT DETECTED NOT DETECTED   Salmonella species NOT DETECTED NOT DETECTED   Serratia marcescens NOT DETECTED NOT DETECTED   Haemophilus influenzae NOT DETECTED NOT DETECTED    Neisseria meningitidis NOT DETECTED NOT DETECTED   Pseudomonas aeruginosa NOT DETECTED NOT DETECTED   Stenotrophomonas maltophilia NOT DETECTED NOT DETECTED   Candida albicans NOT DETECTED NOT DETECTED   Candida auris NOT DETECTED NOT DETECTED   Candida glabrata NOT DETECTED NOT DETECTED   Candida krusei NOT DETECTED NOT DETECTED   Candida parapsilosis NOT DETECTED NOT DETECTED   Candida tropicalis NOT DETECTED NOT DETECTED   Cryptococcus neoformans/gattii NOT DETECTED NOT DETECTED   Methicillin resistance mecA/C NOT DETECTED NOT DETECTED   Meth resistant mecA/C and MREJ NOT DETECTED NOT DETECTED    Ronal CHRISTELLA Rav, PharmD 08/14/2024  4:50 PM

## 2024-08-14 NOTE — Progress Notes (Signed)
 Hypoglycemic Event  CBG: 67  Treatment: 4 oz juice/soda  Symptoms: None  Follow-up CBG: Time:1351 CBG Result:74  Possible Reasons for Event: Unknown  Comments/MD notified: yes    [name]

## 2024-08-14 NOTE — Progress Notes (Addendum)
 Notified by rapid response nurse patient was having issues with low blood pressure dipping into the 60s and 70s.  Fluid challenges albumin  initiated and blood pressure back up into the 90s.  3:15 AM Blood pressures dipping back into the mid 80s but patient asleep and asymptomatic.  Have ordered another liter of normal saline along with 1 bottle of 25% albumin .

## 2024-08-15 DIAGNOSIS — R579 Shock, unspecified: Secondary | ICD-10-CM

## 2024-08-15 DIAGNOSIS — L89159 Pressure ulcer of sacral region, unspecified stage: Secondary | ICD-10-CM | POA: Diagnosis not present

## 2024-08-15 DIAGNOSIS — J9611 Chronic respiratory failure with hypoxia: Secondary | ICD-10-CM | POA: Diagnosis not present

## 2024-08-15 DIAGNOSIS — R531 Weakness: Secondary | ICD-10-CM

## 2024-08-15 DIAGNOSIS — J9612 Chronic respiratory failure with hypercapnia: Secondary | ICD-10-CM | POA: Diagnosis not present

## 2024-08-15 DIAGNOSIS — I110 Hypertensive heart disease with heart failure: Secondary | ICD-10-CM | POA: Diagnosis not present

## 2024-08-15 DIAGNOSIS — G4733 Obstructive sleep apnea (adult) (pediatric): Secondary | ICD-10-CM | POA: Diagnosis not present

## 2024-08-15 DIAGNOSIS — I5030 Unspecified diastolic (congestive) heart failure: Secondary | ICD-10-CM

## 2024-08-15 DIAGNOSIS — N179 Acute kidney failure, unspecified: Secondary | ICD-10-CM | POA: Diagnosis not present

## 2024-08-15 DIAGNOSIS — R7881 Bacteremia: Secondary | ICD-10-CM

## 2024-08-15 DIAGNOSIS — D649 Anemia, unspecified: Secondary | ICD-10-CM

## 2024-08-15 DIAGNOSIS — D696 Thrombocytopenia, unspecified: Secondary | ICD-10-CM

## 2024-08-15 DIAGNOSIS — I2510 Atherosclerotic heart disease of native coronary artery without angina pectoris: Secondary | ICD-10-CM

## 2024-08-15 DIAGNOSIS — B9561 Methicillin susceptible Staphylococcus aureus infection as the cause of diseases classified elsewhere: Secondary | ICD-10-CM

## 2024-08-15 LAB — CBC
HCT: 28.5 % — ABNORMAL LOW (ref 39.0–52.0)
Hemoglobin: 9.2 g/dL — ABNORMAL LOW (ref 13.0–17.0)
MCH: 35.9 pg — ABNORMAL HIGH (ref 26.0–34.0)
MCHC: 32.3 g/dL (ref 30.0–36.0)
MCV: 111.3 fL — ABNORMAL HIGH (ref 80.0–100.0)
Platelets: 105 K/uL — ABNORMAL LOW (ref 150–400)
RBC: 2.56 MIL/uL — ABNORMAL LOW (ref 4.22–5.81)
RDW: 14.6 % (ref 11.5–15.5)
WBC: 7.9 K/uL (ref 4.0–10.5)
nRBC: 0 % (ref 0.0–0.2)

## 2024-08-15 LAB — COMPREHENSIVE METABOLIC PANEL WITH GFR
ALT: 21 U/L (ref 0–44)
AST: 86 U/L — ABNORMAL HIGH (ref 15–41)
Albumin: 3 g/dL — ABNORMAL LOW (ref 3.5–5.0)
Alkaline Phosphatase: 70 U/L (ref 38–126)
Anion gap: 7 (ref 5–15)
BUN: 39 mg/dL — ABNORMAL HIGH (ref 8–23)
CO2: 34 mmol/L — ABNORMAL HIGH (ref 22–32)
Calcium: 8.4 mg/dL — ABNORMAL LOW (ref 8.9–10.3)
Chloride: 100 mmol/L (ref 98–111)
Creatinine, Ser: 1.72 mg/dL — ABNORMAL HIGH (ref 0.61–1.24)
GFR, Estimated: 41 mL/min — ABNORMAL LOW
Glucose, Bld: 106 mg/dL — ABNORMAL HIGH (ref 70–99)
Potassium: 4.2 mmol/L (ref 3.5–5.1)
Sodium: 141 mmol/L (ref 135–145)
Total Bilirubin: 0.3 mg/dL (ref 0.0–1.2)
Total Protein: 5.2 g/dL — ABNORMAL LOW (ref 6.5–8.1)

## 2024-08-15 LAB — GLUCOSE, CAPILLARY
Glucose-Capillary: 100 mg/dL — ABNORMAL HIGH (ref 70–99)
Glucose-Capillary: 75 mg/dL (ref 70–99)
Glucose-Capillary: 84 mg/dL (ref 70–99)
Glucose-Capillary: 89 mg/dL (ref 70–99)
Glucose-Capillary: 90 mg/dL (ref 70–99)
Glucose-Capillary: 98 mg/dL (ref 70–99)

## 2024-08-15 LAB — PHOSPHORUS: Phosphorus: 3.3 mg/dL (ref 2.5–4.6)

## 2024-08-15 LAB — MAGNESIUM: Magnesium: 2 mg/dL (ref 1.7–2.4)

## 2024-08-15 LAB — LACTIC ACID, PLASMA: Lactic Acid, Venous: 0.8 mmol/L (ref 0.5–1.9)

## 2024-08-15 NOTE — Plan of Care (Signed)
  Problem: Clinical Measurements: Goal: Ability to maintain clinical measurements within normal limits will improve Outcome: Progressing Goal: Will remain free from infection Outcome: Progressing Goal: Diagnostic test results will improve Outcome: Progressing Goal: Respiratory complications will improve Outcome: Progressing Goal: Cardiovascular complication will be avoided Outcome: Progressing   Problem: Clinical Measurements: Goal: Will remain free from infection Outcome: Progressing

## 2024-08-15 NOTE — Progress Notes (Signed)
 "  NAME:  [name], MRN:  [id number], DOB:  10-Mar-1951, LOS: 2 ADMISSION DATE:  08/13/2024, CONSULTATION DATE:  08/14/24 REFERRING MD:  Dr. Austria, CHIEF COMPLAINT:  hypotension   History of Present Illness:   74 year old male with past medical history of chronic hypoxic and hypercarbic respiratory failure, inclusion body myositis, OSA, CAD, HFpEF, HTN, HLD, chronic radicular lumbar pain, DVT on eliquis , chronic lymphedema with RLE wound followed at wound care clinic, obesity.  Recent hospitalization 12/15- 12/26 for HFpEF exacerbation complicated by AKI and septic shock 2/2 RLE cellulitis.  Additionally found to have elevation of R hemidiaphragm and given deconditioning and weakness was discharged home on nightly NIV and 2-4L O2 to rehab.  Daughter at bedside also able to provide some background.  Reports stopped wearing NIV at rehab around 1 week ago due to pt and facility confusion.  Discharged home 2 days ago, mostly bed bound at baseline, and pt started taking increased doses of his torsemide  because he was not urinating as much.  Not eating as much but drinking well.  Pt states when he lays flat he has SOB but otherwise denied any pain, recent URI, abnormal cough/ sputum production, N/V/D, dysphagia, chills, fever, leg pain or worsening swelling, dysuria but slight burning at tip of meatus.  Slid out of bed, not hitting head, otherwise asymptomatic.    In ER, found afebrile, stable O2 levels on 4L,  hypotensive with SBP in 70's with AKI with improved BP after fluids.  Admitted to [facility],  however progressively hypotensive overnight despite midodrine , cumulative total of 3.5L of fluid and Albumin  25gm, with HR in 50's.  Started on peripheral NE and PCCM consulted for further evaluation and management of hypotension.    Pertinent  Medical History  chronic hypoxic and hypercarbic respiratory failure, OSA, inclusion body myositis, OSA, CAD, HFpEF, HTN, HLD, chronic radicular lumbar pain, DVT on eliquis ,  chronic lymphedema with RLE wound followed at wound care clinic, obesity  Significant Hospital Events: Including procedures, antibiotic start and stop dates in addition to other pertinent events   1/19 admitted hypotension, AKI  Interim History / Subjective:  No complaints at present.  States he gets SOB if laying flat, chronic issue for him since 2012 or on exertion/talking a lot  BC > 1/2 set positive for MSSA/ staph epidermis TTE showing new moderate RV syst  Objective    Blood pressure (!) 96/57, pulse 87, temperature 97.9 F (36.6 C), temperature source Oral, resp. rate 19, height 5' 11 (1.803 m), weight 123.5 kg, SpO2 97%.    FiO2 (%):  [40 %] 40 %   Intake/Output Summary (Last 24 hours) at 08/15/2024 1200 Last data filed at 08/15/2024 0706 Gross per 24 hour  Intake 621.76 ml  Output 1950 ml  Net -1328.24 ml   Filed Weights   08/13/24 1842  Weight: 123.5 kg   Examination: General:  Chronically ill appearing elderly male sitting in bed on his computer  HEENT: MM pink/moist Neuro: alert, oriented x 4, non focal CV: rr ir, +1 distal pulses PULM:  non labored, clear/ diminished, on 5L at 98% GI: soft, bs+, NT, foley cyu Extremities: warm/dry, no LLE pitting edema, trace to +1 RLE, RLE wrapped  Skin: scattered ecchymosis to UEs  afebrile Labs> bicarb 34, BUN/ sCr 47/ 2.67> 39/ 1.72, lactic 0.8, WBC 7.9, H/H 9.2/ 28.5 UOP 1.9L/ 24hrs   Resolved problem list  Bradycardia  Assessment and Plan   Shock, possibly multifactorial with MSSA bacteremia 2/2 RLE chronic  wound, and possible cardiogenic component with new RV dysfunction HFpEF HTN CAD AKI, improving  Hx BPH - previous TTE 12/25 EF 60-65%, normal RVSF, limited study - baseline sCr 0.6-0.9, previously 0.64 on 12/26 - likely in setting of of decreased PO intake, diuretics, AECi, and BB - no further bradycardia off midodrine  P:  - was off peripheral NE this morning, back on low dose, goal MAP > 65, appears  euvolemic - cortisol 14.2, lactic normal this am.  Remains asymptomatic - 1/2 BC with MSSA/ epidermis.  Repeat BC sent and ID consulted.  Ancef  per ID - repeat echo showing new right moderately reduced RVSF, elevated RSVP  66, mild to mod TR, LVEF hyperdynamic 70-75%.  TTE 06/2024 was limited study, EF 60-65 with normal RVSF.  Cardiology consulted for TEE, would also value further input for echo changes.  LFTs stable, improving renal indices and lactic normal - if ongoing pressor requirements, can consider CVL- although pt does not tolerate laying flat well, if repeat BC neg at 24hrs, ?PICC pending ID input  - optimize electrolytes - remains neuro intact and improving renal indices  - cont to hold pta atenolol , lisinopril , torsemide  and tamsulosin   - cont foley - trend renal indices  - strict I/Os, daily wts - avoid nephrotoxins, renal dose meds, hemodynamic support as above   Chronic hypoxic and hypercarbic respiratory failure -- in setting of deconditioning, neuromuscular weakness in setting of inclusion body myositis OSA - CXR with atelectasis, known R hemidiaphragm elevation  - baseline HOT 2-4L, supposed to be on  home NIV P:  - supplemental O2 for goal > 90- 94% - BiPAP w/ naps and nightly and will need NIV at discharge  - PT - pulm hygiene- IS, flutter   HLD - cont statin   Elevated LFTs - hx of ETOH P:  - stable AST, trend periodically    Hx DVT - cont eliquis  2.5mg  BID  Chronic anemia and thrombocytopenia - stable, trend on CBC   RLE wound and Sacral wound, POA 2/2 PVD - per WOC -  ABI 1/20 abnormal with noncompressible right and left lower extremity arteries, f/u with [facility] outpt  Labs   CBC: Recent Labs  Lab 08/13/24 1234 08/14/24 0352 08/14/24 1025 08/15/24 0331  WBC 9.3 7.9  --  7.9  HGB 10.9* 8.9* 9.2* 9.2*  HCT 35.0* 28.0* 27.0* 28.5*  MCV 111.1* 109.8*  --  111.3*  PLT 100* 74*  --  105*    Basic Metabolic Panel: Recent Labs  Lab  08/13/24 1234 08/14/24 0332 08/14/24 0352 08/14/24 1025 08/14/24 1531 08/15/24 0331  NA 138  --  138 137 140 141  K 4.8  --  4.3 4.1 4.7 4.2  CL 92*  --  96*  --  99 100  CO2 34*  --  31  --  33* 34*  GLUCOSE 96  --  79  --  154* 106*  BUN 49*  --  47*  --  45* 39*  CREATININE 3.14*  --  2.67*  --  2.35* 1.72*  CALCIUM  9.2  --  8.4*  --  8.4* 8.4*  MG  --  2.0  --   --   --  2.0  PHOS  --   --   --   --  4.9* 3.3   GFR: Estimated Creatinine Clearance (by C-G formula based on SCr of 1.72 mg/dL (H)) Male: 59.5 mL/min (A) Male: 48.9 mL/min (A) Recent Labs  Lab 08/13/24 1234  08/13/24 1241 08/13/24 1738 08/14/24 0352 08/15/24 0331  WBC 9.3  --   --  7.9 7.9  LATICACIDVEN  --  2.2* 1.6  --  0.8    Liver Function Tests: Recent Labs  Lab 08/13/24 1234 08/14/24 1531 08/15/24 0331  AST 82*  --  86*  ALT 20  --  21  ALKPHOS 92  --  70  BILITOT 0.6  --  0.3  PROT 6.3*  --  5.2*  ALBUMIN  3.6 3.2* 3.0*   No results for input(s): LIPASE, AMYLASE in the last 168 hours. No results for input(s): AMMONIA in the last 168 hours.  ABG    Component Value Date/Time   PHART 7.373 08/14/2024 1025   PCO2ART 54.1 (H) 08/14/2024 1025   PO2ART 171 (H) 08/14/2024 1025   HCO3 31.5 (H) 08/14/2024 1025   TCO2 33 (H) 08/14/2024 1025   O2SAT 99 08/14/2024 1025     Coagulation Profile: No results for input(s): INR, PROTIME in the last 168 hours.  Cardiac Enzymes: No results for input(s): CKTOTAL, CKMB, CKMBINDEX, TROPONINI in the last 168 hours.  HbA1C: Hgb A1c MFr Bld  Date/Time Value Ref Range Status  05/05/2023 03:14 PM 5.3 4.8 - 5.6 % Final    Comment:             Prediabetes: 5.7 - 6.4          Diabetes: >6.4          Glycemic control for adults with diabetes: <7.0   04/05/2022 12:25 PM 5.2 4.8 - 5.6 % Final    Comment:             Prediabetes: 5.7 - 6.4          Diabetes: >6.4          Glycemic control for adults with diabetes: <7.0      CBG: Recent Labs  Lab 08/14/24 1600 08/14/24 1942 08/14/24 2358 08/15/24 0358 08/15/24 0811  GLUCAP 112* 136* 96 89 75   Allergies Allergies[1]   Home Medications  Prior to Admission medications  Medication Sig Start Date End Date Taking? Authorizing Provider  acetaminophen  (TYLENOL ) 325 MG tablet Take 2 tablets (650 mg total) by mouth every 6 (six) hours as needed (as needed for pain). 07/20/24  Yes Arrien, Elidia Sieving, MD  apixaban  (ELIQUIS ) 2.5 MG TABS tablet Take 1 tablet (2.5 mg total) by mouth 2 (two) times daily. 07/04/23  Yes Okey Vina GAILS, MD  atenolol  (TENORMIN ) 25 MG tablet Take 25 mg by mouth 2 (two) times daily.   Yes [provider]  b complex vitamins tablet Take 1 tablet by mouth every evening.   Yes [provider]  Cholecalciferol (VITAMIN D-3 PO) Take 1 capsule by mouth daily.   Yes [provider]  Coenzyme Q10 (COQ-10 PO) Take 1 capsule by mouth every evening.   Yes [provider]  eszopiclone  3 MG TABS Take 3 mg by mouth at bedtime as needed (for sleep- take immediately before bedtime).   Yes [provider]  ezetimibe  (ZETIA ) 10 MG tablet Take 1 tablet (10 mg total) by mouth daily. Patient taking differently: Take 10 mg by mouth every evening. 05/21/24  Yes Conte, Tessa N, PA-C  fluticasone  (FLONASE ) 50 MCG/ACT nasal spray Place 1 spray into both nostrils 2 (two) times daily as needed for allergies or rhinitis.   Yes [provider]  folic acid  (FOLVITE ) 400 MCG tablet Take 400-800 mcg by mouth daily.  Yes [provider]  furosemide  (LASIX ) 40 MG tablet Take 40 mg by mouth See admin instructions. Take 40 mg by mouth every other morning   Yes [provider]  KRILL OIL PO Take 1 capsule by mouth daily.   Yes [provider]  levocetirizine (XYZAL ) 5 MG tablet Take 5 mg by mouth See admin instructions. Take 5 mg by mouth in the evening as needed for allergies   Yes [provider]  lisinopril  (ZESTRIL ) 10 MG tablet Take 10 mg by mouth daily.   Yes [provider]  potassium chloride  (KLOR-CON ) 10 MEQ tablet Take 10 mEq by mouth every other day.   Yes [provider]  rosuvastatin  (CRESTOR ) 5 MG tablet Take 5 mg by mouth daily.   Yes [provider]  tamsulosin  (FLOMAX ) 0.4 MG CAPS capsule Take 1 capsule (0.4 mg total) by mouth daily. Patient taking differently: Take 0.4 mg by mouth every evening. 04/24/24  Yes McGowen, Aleene DEL, MD  atenolol  (TENORMIN ) 25 MG tablet Take 1 tablet (25 mg total) by mouth daily. Patient not taking: Reported on 08/13/2024 07/21/24   Arrien, Elidia Sieving, MD  feeding supplement (ENSURE PLUS HIGH PROTEIN) LIQD Take 237 mLs by mouth 3 (three) times daily between meals. Patient not taking: Reported on 08/13/2024 07/20/24   Arrien, Mauricio Daniel, MD  Multiple Vitamin (MULTIVITAMIN WITH MINERALS) TABS tablet Take 1 tablet by mouth daily. Patient not taking: Reported on 08/13/2024 07/21/24   Arrien, Mauricio Daniel, MD  nutrition supplement, JUVEN, (JUVEN) PACK Take 1 packet by mouth 2 (two) times daily between meals. Patient not taking: Reported on 08/13/2024 07/20/24   Arrien, Mauricio Daniel, MD  polyethylene glycol (MIRALAX  / GLYCOLAX ) 17 g packet Take 17 g by mouth daily. Patient not taking: Reported on 08/13/2024 07/21/24   Arrien, Elidia Sieving, MD  torsemide  (DEMADEX ) 20 MG tablet Take 1 tablet (20 mg total) by mouth daily. Patient not taking: Reported on 08/13/2024 07/21/24   Arrien, Elidia Sieving, MD         CRITICAL CARE Performed by: Lyle Pesa   Total critical care time: 35 minutes  Critical care time was exclusive of separately billable procedures and treating other patients.  Critical care was necessary to treat or prevent imminent or life-threatening deterioration.  Critical care was time spent personally by me on the following activities: development of treatment plan with  patient and/or surrogate as well as nursing, discussions with consultants, evaluation of patient's response to treatment, examination of patient, obtaining history from patient or surrogate, ordering and performing treatments and interventions, ordering and review of laboratory studies, ordering and review of radiographic studies, pulse oximetry and re-evaluation of patient's condition.    Lyle Pesa, NP [facility] 08/15/2024, 12:00 PM  See Amion for pager If no response to pager , please call [phone] until 7pm After 7:00 pm call Elink  [phone]            [1] No Known Allergies  "

## 2024-08-15 NOTE — Consult Note (Signed)
 "        [facility]    Date of Admission:  08/13/2024   Total days of inpatient antibiotics 2        Reason for Consult:     Principal Problem:   AKI (acute kidney injury)   Assessment: 74 year old male with past medical history of heart failure preserved ejection fraction, DVT on Eliquis , chronic lymphedema, chronic weakness due to inclusion body myositis with recent hospitalization for right lower extremity cellulitis presents for increased weakness, dehydration, AKI from facility found to have #MSSA bacteremia likely secondary to sacral wounds - On arrival WBC 7.9 K, serum creatinine 3.14, 1.72 today - Blood cultures grew 2 out of 4 bottles MSSA.  Patient placed on cefazolin . - I looked at his right lower extremity wound, no concern for active infection.  Image review of sacral wounds does show some erythema I suspect that is the source of bacteremia.  Recommendations:  -Repeat blood cultures to ensure clearance - Continue cefazolin  - TTE no vegetation will need TEE - Continue wound care - Standard precautions - Plan communicated with primary Microbiology:   Antibiotics: Cefazolin  1/20-present  Cultures: Blood 1/19 1 out of 2 sets, bottles MSSA Urine  Other   HPI: [name] is a 74 y.o. adult With past medical history of morbid obesity, heart failure preserved ejection fraction, DVT on Eliquis , hypertension, chronic lymphedema, chronic weakness due to inclusion body myositis.,  Recent hospitalization 12 16-12/26 for right lower extremity cellulitis complicated by septic shock treated with linezolid  and cefepime  and local wound care presents from skilled facility for weakness, dehydration, hypotension and AKI.  Admission labs showed WBC 7.9 K chest x-ray showed persistent low lung volumes chronic elevation of right hemidiaphragm increase right basilar atelectasis.  Admitted for hypotension and AKI found to have MSSA bacteremia ID  consult   Review of Systems: Review of Systems  All other systems reviewed and are negative.   Past Medical History:  Diagnosis Date   Adenomatous colon polyp - diminutive 06/2019   no recall needed   Allergy sinus   hay fever   Bilateral lower extremity edema    BPH with lower urinary tract symptoms without urinary obstruction    Cataract    Coronary artery disease    a. s/p BMS to LAD (1998) b. s/p DES to RCA (06/24/14)   DVT (deep venous thrombosis) (HCC)    12/2020 L femoral and poplitea.  Unprovoked.  hypercoag w/u NEG.   Eliquis .   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 ?inclusion body myositis? Power WC as of 2022   Macrocytic anemia    Unknown etiology (bone marrow biopsy benign 2025)   Osteoarthritis, multiple sites    Palatal fistula    Palpable abd. aorta    Aortic u/s NO ANEURISM 2020   RBBB    Spinal stenosis    Dr Royal Bury  surgery   Thrombocytopenia    Uknown etiology (Bone marrow biopsy benign 2025)    Social History[1]  Family History  Problem Relation Age of Onset   Heart attack Mother  Esophageal cancer Mother    Cancer Mother    Aneurysm Father    Heart disease Father    Early death Father    Cystic fibrosis Brother        no prior health issues, rare form of cystic fibrosis of the lungs 03-30-25 Aug 30, 2014)   Early death Brother    Stroke Neg Hx    Scheduled Meds:  apixaban   2.5 mg Oral BID   Chlorhexidine  Gluconate Cloth  6 each Topical Daily   ezetimibe   10 mg Oral QPM   liver oil-zinc  oxide  1 Application Topical BID   thiamine   100 mg Oral Daily   Continuous Infusions:   ceFAZolin  (ANCEF ) IV 2 g (08/15/24 0950)   norepinephrine  (LEVOPHED ) Adult infusion  Stopped (08/15/24 0657)   PRN Meds:.acetaminophen  **OR** acetaminophen , albuterol , ondansetron  **OR** ondansetron  (ZOFRAN ) IV, mouth rinse, traZODone  Allergies[2]  OBJECTIVE: Blood pressure (!) 149/49, pulse 75, temperature 97.9 F (36.6 C), temperature source Oral, resp. rate (!) 22, height 5' 11 (1.803 m), weight 123.5 kg, SpO2 99%.  Physical Exam Constitutional:      General: He is not in acute distress.    Appearance: He is normal weight. He is not toxic-appearing.  HENT:     Head: Normocephalic and atraumatic.     Right Ear: External ear normal.     Left Ear: External ear normal.     Nose: No congestion or rhinorrhea.     Mouth/Throat:     Mouth: Mucous membranes are moist.     Pharynx: Oropharynx is clear.  Eyes:     Extraocular Movements: Extraocular movements intact.     Conjunctiva/sclera: Conjunctivae normal.     Pupils: Pupils are equal, round, and reactive to light.  Cardiovascular:     Rate and Rhythm: Normal rate and regular rhythm.     Heart sounds: No murmur heard.    No friction rub. No gallop.  Pulmonary:     Effort: Pulmonary effort is normal.     Breath sounds: Normal breath sounds.  Abdominal:     General: Abdomen is flat. Bowel sounds are normal.     Palpations: Abdomen is soft.  Musculoskeletal:     Cervical back: Normal range of motion and neck supple.  Skin:    General: Skin is warm and dry.  Neurological:     General: No focal deficit present.     Mental Status: He is oriented to person, place, and time.  Psychiatric:        Mood and Affect: Mood normal.          Lab Results Lab Results  Component Value Date   WBC 7.9 08/15/2024   HGB 9.2 (L) 08/15/2024   HCT 28.5 (L) 08/15/2024   MCV 111.3 (H) 08/15/2024   PLT 105 (L) 08/15/2024    Lab Results  Component Value Date   CREATININE 1.72 (H) 08/15/2024   BUN 39 (H) 08/15/2024   NA 141 08/15/2024   K 4.2 08/15/2024   CL 100 08/15/2024   CO2 34 (H) 08/15/2024    Lab Results   Component Value Date   ALT 21 08/15/2024   AST 86 (H) 08/15/2024   ALKPHOS 70 08/15/2024   BILITOT 0.3 08/15/2024       Loney Stank, MD [facility] 08/15/2024, 10:55 AM Evaluation of this patient requires complex antimicrobial therapy evaluation and counseling + isolation needs for disease transmission risk assessment and mitigation      [  1]  Social History Tobacco Use   Smoking status: Former    Current packs/day: 0.00    Average packs/day: 0.8 packs/day for 23.0 years (17.3 ttl pk-yrs)    Types: Cigarettes    Start date: 07/26/1966    Quit date: 07/26/1989    Years since quitting: 35.0   Smokeless tobacco: Never  Vaping Use   Vaping status: Never Used  Substance Use Topics   Alcohol use: Yes    Comment: 3-6 cans per week   Drug use: No  [2] No Known Allergies  "

## 2024-08-15 NOTE — Consult Note (Addendum)
 WOC Nurse wound follow up Refer to previous WOC consults on 1/19 and 1/20.  Today I was requested to assess right leg since previous wrap has been removed, and reapply dressing. Pt is followed by the outpatient wound care center for weekly dressing changes and compression wraps; they are using Silver hydrofiber.   Right posterior leg with healing full thickness wound, 1.5X.3X.2cm, 2 areas of punched out wounds separated by narrow brisge of intact skin, red and moist.  Slight  edema, red rash and erythremia surrounding right anterior calf,no weeping.   Dressing procedure/placement/frequency: Topical treatment orders provided for bedside nurses to perform as previously ordered by the outpatient wound care center:  If patient is still in the hospital on Wed, 1/28, please change right leg dressing as follows: Cut piece of Aquacel (#[id number]) and apply to right posterior leg, then cover with foam dressing.  Moisten with NS each time to remove previous dressing.  Apply kerlex, beginning just behind toes to below knee, then ace wrap in the same manner.   Pt should resume follow-up with the outpatient wound care center after discharge. Please re-consult if further assistance is needed.  Thank-you,   Stephane Fought MSN, RN, CWOCN, CWCN-AP, CNS Contact Mon-Fri 0700-1500: [phone]

## 2024-08-15 NOTE — Progress Notes (Addendum)
 " PROGRESS NOTE    [name]  FMW:[id number] DOB: 05-Dec-1950 DOA: 08/13/2024 PCP: Candise Aleene DEL, MD    Brief Narrative:   [name] is a 74 y.o. adult with past medical history significant for inclusion body myositis, HTN, history of DVT on Eliquis , chronic diastolic congestive heart failure, chronic hypoxic respite failure (2-4L [name] baseline), venous insufficiency with chronic bilateral lower extremity edema, right lower extremity wounds (follows with wound care clinc), chronic hypoxic/hypercarbic respiratory failure, OSA, CAD, HLD, obesity who presented to [facility] ED on 08/13/2024 from home via EMS due to fall, apparently slid out of his bed, increased weakness.  Patient denied loss of consciousness, did not strike his head.  Recently discharged from SNF a few days prior.  Reports continue to take his prescribed occasions but has been taking a few extra doses of his torsemide  over the last few days to attempt to improve his lower extremity edema.  In the ED, temperature 97.5 F, HR 71, RR 70/35, SpO2 73% on room air.  WBC 9.3, hemoglobin 10.9, platelet count 100.  Sodium 138, potassium 4.8, chloride 92, CO2 34, glucose 96, BUN 49, creat 3.14.  AST 82, ALT 20, total bilirubin 0.6.  BNP 713.0.  High-sensitivity troponin 306 followed by 261.  Lactic acid 2.2 followed by 1.6.  Chest x-ray with persistent low lung volumes with chronic elevation of the right hemidiaphragm and increased right basilar atelectasis.  Blood cultures x 2 obtained.  EKG with sinus rhythm, rate 60, QTc 501, no concerning ST elevation/depressions or T wave inversions.  Patient received 1.5 L LR bolus in the ED.  TRH consulted for admission for further evaluation and management of fall, weakness, hypotension.  Assessment & Plan:   Shock, hypovolemic vs septic MSSA bacteremia Staphylococcus epidermidis bacteremia Patient presenting after fall from home, apparently slid out of his bed.  Recently discharged from SNF a  few days prior.  Recently admitted for septic shock secondary to lower extremity cellulitis/wounds.  Patient is afebrile without leukocytosis.  Lactic acid elevated 2.2.  Blood pressure 70/35.  Apparently has been taking additional doses of his oral diuretic to help with his lower extremity edema over the last few days.  Suspect etiology likely secondary to hypovolemia versus cardiogenic.  Patient received 3.5 L of aggressive IV fluid resuscitation, 25 g of IV albumin  and started on midodrine  without significant improvement of blood pressure.  TTE with LVEF 7975%, LV with no regional wall motion normalities, LV diastolic parameters indeterminate, LA mildly dilated, RA moderately dilated, mild dilation ascending aorta measuring 41 mm, IVC dilated in size no vegetations noted. -- PCCM following, appreciate assistance -- Infectious disease consulted -- Cardiology consulted for TEE -- Blood cultures x 2: Left hand + GPC's; BC ID positive for MSSA, staph epi; further identification/susceptibilities pending -- Holding home atenolol , lisinopril , tamsulosin , torsemide  -- peripheral norepinephrine ; titrate to keep MAP greater than 65 -- Cefazolin  2 g IV every 8 hours -- Will repeat blood cultures tomorrow morning  Acute renal failure Creatinine 3.14 on admission.  Was 0.64 on 07/20/2024.  Likely secondary to prerenal azotemia from excessive diuretic use versus ATN from hypotension.  Received aggressive IV fluid resuscitation and IV albumin  on admission. -- Cr 3.14>2.67>1.72 -- Holding home lisinopril  and atenolol  -- Foley placed to monitor urine output, 1950 mL past 24 hours -- Strict I's and O's  Elevated LFTs AST elevated 82, ALT 20.  Unclear etiology, suspect mild shock liver due to hypotension as above. -- AST 82>86 --  ALT 20>21 -- Tbili -- Holding home statin -- CMP in a.m.  HTN Chronic diastolic congestive heart failure, compensated -- Hold home atenolol , torsemide  and lisinopril  -- Strict  I's and O's and daily weights  Peripheral vascular disease Venous insufficiency Right lower extremity wounds Sacral wound Patient follows with wound clinic outpatient, Dr. Marolyn Wound 07/10/24 Pressure Injury Buttocks Right;Left Deep Tissue Pressure Injury - Purple or maroon localized area of discolored intact skin or blood-filled blister due to damage of underlying soft tissue from pressure and/or shear. (Active)     Wound 08/13/24 1840 Pressure Injury Coccyx Mid Stage 2 -  Partial thickness loss of dermis presenting as a shallow open injury with a red, pink wound bed without slough. (Active)     Wound 08/13/24 1840 Pressure Injury Buttocks Right;Left;Medial Deep Tissue Pressure Injury - Purple or maroon localized area of discolored intact skin or blood-filled blister due to damage of underlying soft tissue from pressure and/or shear. (Active)     Wound 08/13/24 1841 Pressure Injury Ischial tuberosity Right;Left Deep Tissue Pressure Injury - Purple or maroon localized area of discolored intact skin or blood-filled blister due to damage of underlying soft tissue from pressure and/or shear. (Active)  -- Wound RN consulted -- ABI 1/20 abnormal with noncompressible right and left lower extremity arteries. -- Cleanse sacrum/coccyx with NS, apply a small piece of Aquacel AG silver hydrofiber to coccyx and secure with silicone foam.  -- Apply a thin layer of Desitin to buttocks 2 times daily and prn soiling.  -- Cleanse B ischium/posterior thighs with NS, apply Xeroform gauze to areas daily and secure with silicone foam.    Right leg now unwrapped- Patient is followed at wound care center for lymphedema and ulcerations to right leg; last seen 1/16 with silver hydrofiber and compression wrap applied; this is performed weekly at [facility] due next 08/18/2023.   -- Would benefit from vascular surgery follow-up outpatient  History of DVT -- Eliquis  2.5 m p.o. twice daily  CAD HLD -- Zetia  10 mg p.o.  daily -- Hold home Crestor  due to elevated LFTs  OSA Patient was to be on BiPAP after discharge from SNF.  Family working on obtaining device.  Has been off since. -- BiPAP -- TOC consulted for home DME NIV  Mr Ginyard requires volume targeted ventilation via NIV/HMV for nocturnal and daytime use for CRF secondary to Obesity Hypoventilation Syndrome.  PaCO2 confirmed at 54 on ABG. Alarms and internal battery are required to ensure continuous support and immediate recognition of ventilatory insufficiency. A RAD with backup rate is insufficient due to the severity of illness. Volume ventilation is needed to reduce work of breathing and fatigue and to prevent life threatening exacerbations. OSA is not the predominant cause of hypercapnia.  BPH -- Holding tamsulosin  due to hypotension as above  Obesity, class II Body mass index is 37.97 kg/m.    DVT prophylaxis: apixaban  (ELIQUIS ) tablet 2.5 mg Start: 08/13/24 2200 apixaban  (ELIQUIS ) tablet 2.5 mg    Code Status: Full Code Family Communication:   Disposition Plan:  Level of care: ICU Status is: Inpatient Remains inpatient appropriate because: Transfer to ICU for hypotension/shock resistant to fluid/volume resuscitation    Consultants:  PCCM Infectious disease Cardiology  Procedures:  TTE:   Antimicrobials:  Cefazolin    Subjective: Patient seen examined bedside, lying in bed.  Awake, daughter present at bedside.  Was initially titrated off of Levophed  this morning, now restarted due to hypotension.  Seen by infectious disease, started on  cefazolin  yesterday for MSSA bacteremia likely sacral wound source.  TTE without vegetations, ID requesting TEE.   Patient complains of generalized weakness and shortness of breath, no other specific complaints.  Discussed with daughter who is a teacher, early years/pre at bedside.  Patient with no other questions or concerns.  Denies headache, no chest pain, no palpitations, no abdominal pain, no fever/chills,  no nausea/vomiting/diarrhea.  Objective: Vitals:   08/15/24 1040 08/15/24 1045 08/15/24 1050 08/15/24 1055  BP:  (!) 65/36 (S) (!) 80/50 (!) 96/57  Pulse: 87 88 83 87  Resp: (!) 22 (!) 29 (!) 30 19  Temp:      TempSrc:      SpO2: 94% 96% 95% 97%  Weight:      Height:        Intake/Output Summary (Last 24 hours) at 08/15/2024 1105 Last data filed at 08/15/2024 0706 Gross per 24 hour  Intake 621.76 ml  Output 1950 ml  Net -1328.24 ml   Filed Weights   08/13/24 1842  Weight: 123.5 kg    Examination:  Physical Exam: GEN: NAD, alert and oriented x 3, obese, chronically ill in appearance HEENT: NCAT, PERRL, EOMI, sclera clear, MMM PULM: Breath sounds slight diminished our bases, no wheezes/crackles, normal respiratory effort without accessory muscle use, on 8 L nasal cannula with SpO2 100% at rest (baseline 2-4L) CV: RRR w/o M/G/R GI: abd soft, NTND, + BS MSK: + LE peripheral edema, RLE with Unna boot in place, extremities with multiple areas of ecchymosis, chronic venous changes in various stages of healing NEURO: No focal neurologic deficit PSYCH: normal mood/affect Integumentary: Right lower extremity wound, sacral wound, multiple areas of ecchymosis to extremities in various stages of healing            Data Reviewed: I have personally reviewed following labs and imaging studies  CBC: Recent Labs  Lab 08/13/24 1234 08/14/24 0352 08/14/24 1025 08/15/24 0331  WBC 9.3 7.9  --  7.9  HGB 10.9* 8.9* 9.2* 9.2*  HCT 35.0* 28.0* 27.0* 28.5*  MCV 111.1* 109.8*  --  111.3*  PLT 100* 74*  --  105*   Basic Metabolic Panel: Recent Labs  Lab 08/13/24 1234 08/14/24 0332 08/14/24 0352 08/14/24 1025 08/14/24 1531 08/15/24 0331  NA 138  --  138 137 140 141  K 4.8  --  4.3 4.1 4.7 4.2  CL 92*  --  96*  --  99 100  CO2 34*  --  31  --  33* 34*  GLUCOSE 96  --  79  --  154* 106*  BUN 49*  --  47*  --  45* 39*  CREATININE 3.14*  --  2.67*  --  2.35* 1.72*   CALCIUM  9.2  --  8.4*  --  8.4* 8.4*  MG  --  2.0  --   --   --  2.0  PHOS  --   --   --   --  4.9* 3.3   GFR: Estimated Creatinine Clearance (by C-G formula based on SCr of 1.72 mg/dL (H)) Male: 59.5 mL/min (A) Male: 48.9 mL/min (A) Liver Function Tests: Recent Labs  Lab 08/13/24 1234 08/14/24 1531 08/15/24 0331  AST 82*  --  86*  ALT 20  --  21  ALKPHOS 92  --  70  BILITOT 0.6  --  0.3  PROT 6.3*  --  5.2*  ALBUMIN  3.6 3.2* 3.0*   No results for input(s): LIPASE, AMYLASE in the last 168  hours. No results for input(s): AMMONIA in the last 168 hours. Coagulation Profile: No results for input(s): INR, PROTIME in the last 168 hours. Cardiac Enzymes: No results for input(s): CKTOTAL, CKMB, CKMBINDEX, TROPONINI in the last 168 hours. BNP (last 3 results) Recent Labs    04/10/24 1338 07/09/24 1230 08/13/24 1234  PROBNP 549* 909.0* 713.0*   HbA1C: No results for input(s): HGBA1C in the last 72 hours. CBG: Recent Labs  Lab 08/14/24 1600 08/14/24 1942 08/14/24 2358 08/15/24 0358 08/15/24 0811  GLUCAP 112* 136* 96 89 75   Lipid Profile: No results for input(s): CHOL, HDL, LDLCALC, TRIG, CHOLHDL, LDLDIRECT in the last 72 hours. Thyroid  Function Tests: No results for input(s): TSH, T4TOTAL, FREET4, T3FREE, THYROIDAB in the last 72 hours. Anemia Panel: No results for input(s): VITAMINB12, FOLATE, FERRITIN, TIBC, IRON, RETICCTPCT in the last 72 hours. Sepsis Labs: Recent Labs  Lab 08/13/24 1241 08/13/24 1738 08/15/24 0331  LATICACIDVEN 2.2* 1.6 0.8    Recent Results (from the past 240 hours)  Blood culture (routine x 2)     Status: None (Preliminary result)   Collection Time: 08/13/24 12:28 PM   Specimen: BLOOD  Result Value Ref Range Status   Specimen Description   Final    BLOOD RIGHT ANTECUBITAL Performed at [facility], [address] [place], [place] [address]    Special  Requests   Final    BOTTLES DRAWN AEROBIC AND ANAEROBIC Blood Culture adequate volume Performed at [facility], [address] [place], [place] [address]    Culture   Final    NO GROWTH 2 DAYS Performed at [facility], [address], [place], [place] [address]    Report Status PENDING  Incomplete  Blood culture (routine x 2)     Status: Abnormal (Preliminary result)   Collection Time: 08/13/24  5:38 PM   Specimen: BLOOD LEFT HAND  Result Value Ref Range Status   Specimen Description   Final    BLOOD LEFT HAND Performed at [facility], [address], [place], [place] [address]    Special Requests   Final    BOTTLES DRAWN AEROBIC AND ANAEROBIC Blood Culture adequate volume Performed at [facility], [address], [place], [place] [address]    Culture  Setup Time   Final    GRAM POSITIVE COCCI IN CLUSTERS IN BOTH AEROBIC AND ANAEROBIC BOTTLES Organism ID to follow PHARMD M. SWAYNE [id number] @ 1647 FH    Culture (A)  Final    STAPHYLOCOCCUS AUREUS CULTURE REINCUBATED FOR BETTER GROWTH SUSCEPTIBILITIES TO FOLLOW Performed at [facility], [address] [place], [place] [address]    Report Status PENDING  Incomplete  Blood Culture ID Panel (Reflexed)     Status: Abnormal   Collection Time: 08/13/24  5:38 PM  Result Value Ref Range Status   Enterococcus faecalis NOT DETECTED NOT DETECTED Final   Enterococcus Faecium NOT DETECTED NOT DETECTED Final   Listeria monocytogenes NOT DETECTED NOT DETECTED Final   Staphylococcus species DETECTED (A) NOT DETECTED Final    Comment: CRITICAL RESULT CALLED TO, READ BACK BY AND VERIFIED WITH: PHARMD M. SWAYNE [id number] @ 1647 FH    Staphylococcus aureus (BCID) DETECTED (A) NOT DETECTED Final    Comment: Methicillin (oxacillin) susceptible Staphylococcus aureus (MSSA). Preferred therapy is anti staphylococcal beta lactam antibiotic (Cefazolin  or Nafcillin), unless clinically  contraindicated. CRITICAL RESULT CALLED TO, READ BACK BY AND VERIFIED WITH: PHARMD M.  SWAYNE [id number] @ 1647 FH    Staphylococcus epidermidis DETECTED (A) NOT DETECTED Final    Comment: CRITICAL RESULT CALLED TO, READ BACK BY AND VERIFIED WITH: PHARMD EMERSON RAV [id number] @ 1647 FH    Staphylococcus lugdunensis NOT DETECTED NOT DETECTED Final   Streptococcus species NOT DETECTED NOT DETECTED Final   Streptococcus agalactiae NOT DETECTED NOT DETECTED Final   Streptococcus pneumoniae NOT DETECTED NOT DETECTED Final   Streptococcus pyogenes NOT DETECTED NOT DETECTED Final   A.calcoaceticus-baumannii NOT DETECTED NOT DETECTED Final   Bacteroides fragilis NOT DETECTED NOT DETECTED Final   Enterobacterales NOT DETECTED NOT DETECTED Final   Enterobacter cloacae complex NOT DETECTED NOT DETECTED Final   Escherichia coli NOT DETECTED NOT DETECTED Final   Klebsiella aerogenes NOT DETECTED NOT DETECTED Final   Klebsiella oxytoca NOT DETECTED NOT DETECTED Final   Klebsiella pneumoniae NOT DETECTED NOT DETECTED Final   Proteus species NOT DETECTED NOT DETECTED Final   Salmonella species NOT DETECTED NOT DETECTED Final   Serratia marcescens NOT DETECTED NOT DETECTED Final   Haemophilus influenzae NOT DETECTED NOT DETECTED Final   Neisseria meningitidis NOT DETECTED NOT DETECTED Final   Pseudomonas aeruginosa NOT DETECTED NOT DETECTED Final   Stenotrophomonas maltophilia NOT DETECTED NOT DETECTED Final   Candida albicans NOT DETECTED NOT DETECTED Final   Candida auris NOT DETECTED NOT DETECTED Final   Candida glabrata NOT DETECTED NOT DETECTED Final   Candida krusei NOT DETECTED NOT DETECTED Final   Candida parapsilosis NOT DETECTED NOT DETECTED Final   Candida tropicalis NOT DETECTED NOT DETECTED Final   Cryptococcus neoformans/gattii NOT DETECTED NOT DETECTED Final   Methicillin resistance mecA/C NOT DETECTED NOT DETECTED Final   Meth resistant mecA/C and MREJ NOT DETECTED NOT DETECTED Final     Comment: Performed at [facility], [address], [place], [place] [address]  MRSA Next Gen by PCR, Nasal     Status: None   Collection Time: 08/14/24  8:45 AM   Specimen: Nasal Mucosa; Nasal Swab  Result Value Ref Range Status   MRSA by PCR Next Gen NOT DETECTED NOT DETECTED Final    Comment: (NOTE) The GeneXpert MRSA Assay (FDA approved for NASAL specimens only), is one component of a comprehensive MRSA colonization surveillance program. It is not intended to diagnose MRSA infection nor to guide or monitor treatment for MRSA infections. Test performance is not FDA approved in patients less than 43 years old. Performed at [facility], [address], [place], [place] [address]          Radiology Studies: ECHOCARDIOGRAM COMPLETE Result Date: 08/14/2024    ECHOCARDIOGRAM REPORT   Patient Name:   [name] Date of Exam: 08/14/2024 Medical Rec #:  [id number]   Height:       71.0 in Accession #:    [id number]  Weight:       272.3 lb Date of Birth:  Dec 14, 1950   BSA:          2.404 m Patient Age:    74 years    BP:           92/33 mmHg Patient Gender: M           HR:           70 bpm. Exam Location:  Inpatient Procedure: 2D Echo, Cardiac Doppler, Color Doppler and Intracardiac            Opacification Agent (Both Spectral and Color Flow Doppler were  utilized during procedure). Indications:    Shock  History:        Patient has prior history of Echocardiogram examinations, most                 recent 07/10/2024. CHF, CAD, Abnormal ECG, Arrythmias:Atrial                 Fibrillation, Signs/Symptoms:Bacteremia; Risk                 Factors:Hypertension.  Sonographer:    Ellouise Mose RDCS Referring Phys: [id number] [place] Schreiner J Kairav Russomanno  Sonographer Comments: Technically difficult study due to poor echo windows, suboptimal parasternal window, suboptimal apical window and suboptimal subcostal window. Study was originally ordered as limited, then MD changed to complete with  definity . IMPRESSIONS  1. Left ventricular ejection fraction, by estimation, is 70 to 75%. The left ventricle has hyperdynamic function. The left ventricle has no regional wall motion abnormalities. Left ventricular diastolic parameters are indeterminate.  2. Right ventricular systolic function is moderately reduced. The right ventricular size is severely enlarged. Mildly increased right ventricular wall thickness. There is severely elevated pulmonary artery systolic pressure. The estimated right ventricular systolic pressure is 66.0 mmHg.  3. Left atrial size was mildly dilated.  4. Right atrial size was moderately dilated.  5. The mitral valve is grossly normal. No evidence of mitral valve regurgitation. No evidence of mitral stenosis.  6. Tricuspid valve regurgitation is mild to moderate.  7. The aortic valve is grossly normal. Aortic valve regurgitation is not visualized.  8. Aortic dilatation noted. There is mild dilatation of the ascending aorta, measuring 41 mm.  9. The inferior vena cava is dilated in size with <50% respiratory variability, suggesting right atrial pressure of 15 mmHg. Comparison(s): Prior images reviewed side by side. Changes from prior study are noted. Right ventricular size has increased and right ventricular function has decreased. FINDINGS  Left Ventricle: Left ventricular ejection fraction, by estimation, is 70 to 75%. The left ventricle has hyperdynamic function. The left ventricle has no regional wall motion abnormalities. Definity  contrast agent was given IV to delineate the left ventricular endocardial borders. The left ventricular internal cavity size was normal in size. There is borderline concentric left ventricular hypertrophy. Left ventricular diastolic function could not be evaluated due to atrial fibrillation. Left ventricular diastolic parameters are indeterminate. Right Ventricle: The right ventricular size is severely enlarged. Mildly increased right ventricular wall  thickness. Right ventricular systolic function is moderately reduced. There is severely elevated pulmonary artery systolic pressure. The tricuspid regurgitant velocity is 3.57 m/s, and with an assumed right atrial pressure of 15 mmHg, the estimated right ventricular systolic pressure is 66.0 mmHg. Left Atrium: Left atrial size was mildly dilated. Right Atrium: Right atrial size was moderately dilated. Pericardium: There is no evidence of pericardial effusion. Mitral Valve: The mitral valve is grossly normal. Mild mitral annular calcification. No evidence of mitral valve regurgitation. No evidence of mitral valve stenosis. Tricuspid Valve: The tricuspid valve is grossly normal. Tricuspid valve regurgitation is mild to moderate. No evidence of tricuspid stenosis. Aortic Valve: The aortic valve is grossly normal. Aortic valve regurgitation is not visualized. Pulmonic Valve: The pulmonic valve was grossly normal. Pulmonic valve regurgitation is mild. Aorta: Aortic dilatation noted. There is mild dilatation of the ascending aorta, measuring 41 mm. Venous: The inferior vena cava is dilated in size with less than 50% respiratory variability, suggesting right atrial pressure of 15 mmHg. IAS/Shunts: The interatrial septum was not well visualized.  LEFT VENTRICLE PLAX 2D LVIDd:         4.00 cm LVIDs:         2.70 cm LV PW:         1.00 cm LV IVS:        1.20 cm  LV Volumes (MOD) LV vol d, MOD A2C: 109.7 ml LV vol d, MOD A4C: 112.0 ml LV vol s, MOD A2C: 25.5 ml LV vol s, MOD A4C: 41.3 ml LV SV MOD A2C:     84.3 ml LV SV MOD A4C:     112.0 ml LV SV MOD BP:      78.9 ml IVC IVC diam: 2.45 cm RIGHT ATRIUM           Index RA Area:     25.10 cm RA Volume:   80.90 ml  33.65 ml/m  AORTIC VALVE LVOT Vmax:   89.70 cm/s LVOT Vmean:  56.000 cm/s LVOT VTI:    0.194 m  AORTA Ao Asc diam: 4.03 cm MITRAL VALVE                TRICUSPID VALVE MV Area (PHT): 3.65 cm     TR Peak grad:   51.0 mmHg MV Decel Time: 208 msec     TR Vmax:         357.00 cm/s MV E velocity: 102.00 cm/s                             SHUNTS                             Systemic VTI: 0.19 m Jerel Croitoru MD Electronically signed by Jerel Balding MD Signature Date/Time: 08/14/2024/7:26:01 PM    Final    VAS US  ABI WITH/WO TBI Result Date: 08/14/2024  LOWER EXTREMITY DOPPLER STUDY Patient Name:  [name]  Date of Exam:   08/14/2024 Medical Rec #: [id number]    Accession #:    [id number] Date of Birth: 03/24/1951    Patient Gender: M Patient Age:   55 years Exam Location:  [facility] Procedure:      VAS US  ABI WITH/WO TBI Referring Phys: Amarea Macdowell --------------------------------------------------------------------------------  Indications: Peripheral artery disease. High Risk Factors: Hypertension, hyperlipidemia, past history of smoking,                    coronary artery disease.  Vascular Interventions: CHF, Lymphedema, Hx of DVT and venous stasis disease. Limitations: Today's exam was limited due to continuous patient movement,              tough/thickened skin. Comparison Study: Previous exam 03/04/2022 Performing Technologist: Leigh Rom RVT, RDMS  Examination Guidelines: A complete evaluation includes at minimum, Doppler waveform signals and systolic blood pressure reading at the level of bilateral brachial, anterior tibial, and posterior tibial arteries, when vessel segments are accessible. Bilateral testing is considered an integral part of a complete examination. Photoelectric Plethysmograph (PPG) waveforms and toe systolic pressure readings are included as required and additional duplex testing as needed. Limited examinations for reoccurring indications may be performed as noted.  ABI Findings: +---------+------------------+-----+----------+----------------+ Right    Rt Pressure (mmHg)IndexWaveform  Comment          +---------+------------------+-----+----------+----------------+ Brachial 105                    triphasic                   +---------+------------------+-----+----------+----------------+  PTA                             monophasicnon compressible +---------+------------------+-----+----------+----------------+ DP                              monophasicnon compressible +---------+------------------+-----+----------+----------------+ Great Toe26                0.23 Abnormal                   +---------+------------------+-----+----------+----------------+ +---------+------------------+-----+----------+----------------+ Left     Lt Pressure (mmHg)IndexWaveform  Comment          +---------+------------------+-----+----------+----------------+ Brachial 112                    triphasic                  +---------+------------------+-----+----------+----------------+ PTA      127               1.13 monophasic                 +---------+------------------+-----+----------+----------------+ DP                              monophasicnon compressible +---------+------------------+-----+----------+----------------+ Great Toe20                0.18 Abnormal                   +---------+------------------+-----+----------+----------------+ +-------+-----------+-----------+------------+------------+ ABI/TBIToday's ABIToday's TBIPrevious ABIPrevious TBI +-------+-----------+-----------+------------+------------+ Right  Between         0.23       [place]          0.75         +-------+-----------+-----------+------------+------------+ Left   [place]         0.18       [place]          0.57         +-------+-----------+-----------+------------+------------+ Arterial wall calcification precludes accurate ankle pressures and ABIs. Bilateral ABIs appear essentially unchanged. Bilateral TBIs appear decreased.  Summary: Right: Resting right ankle-brachial index indicates noncompressible right lower extremity arteries. The right toe-brachial index is abnormal.  Left: Resting left ankle-brachial index indicates  noncompressible left lower extremity arteries. The left toe-brachial index is abnormal.  *See table(s) above for measurements and observations.  Electronically signed by Gaile New MD on 08/14/2024 at 5:42:38 PM.    Final    DG CHEST PORT 1 VIEW Result Date: 08/14/2024 CLINICAL DATA:  Shortness of breath. EXAM: PORTABLE CHEST 1 VIEW COMPARISON:  August 13, 2024 FINDINGS: Stable cardiomegaly. Elevated right hemidiaphragm is noted. Minimal bibasilar subsegmental atelectasis is noted with small pleural effusions. IMPRESSION: Minimal bibasilar subsegmental atelectasis with small pleural effusions. Electronically Signed   By: Lynwood Landy Raddle M.D.   On: 08/14/2024 10:48   DG Chest 1 View Result Date: 08/13/2024 CLINICAL DATA:  Shortness of breath. EXAM: CHEST  1 VIEW COMPARISON:  07/17/2024. FINDINGS: Persistent low lung volumes with chronic elevation of the right hemidiaphragm and increased right basilar atelectasis. Left lung appears clear. No pneumothorax. Stable cardiomegaly. No acute osseous abnormality. IMPRESSION: Persistent low lung volumes with chronic elevation of the right hemidiaphragm and increased right basilar atelectasis. Electronically Signed   By: Harrietta Sherry M.D.   On: 08/13/2024 13:54        Scheduled Meds:  apixaban   2.5 mg Oral BID   Chlorhexidine  Gluconate Cloth  6 each Topical Daily   ezetimibe   10 mg Oral QPM   liver oil-zinc  oxide  1 Application Topical BID   thiamine   100 mg Oral Daily   Continuous Infusions:   ceFAZolin  (ANCEF ) IV Stopped (08/15/24 1058)   norepinephrine  (LEVOPHED ) Adult infusion 2 mcg/min (08/15/24 1058)     LOS: 2 days   Critical Care Time Upon my evaluation, this patient had a high probability of imminent or life-threatening deterioration due to undifferentiated shock likely secondary to hypovolemic versus septic, which required my direct attention, intervention, and personal management.  I have personally provided 41 minutes of critical  care time exclusive of my time spent on separately billable procedures.  Time includes review of laboratory data, radiology results, discussion with consultants, and monitoring for potential decompensation.       Camellia PARAS Thorne Wirz, DO [facility] Available via Epic secure chat 7am-7pm After these hours, please refer to coverage provider listed on [email or web address] 08/15/2024, 11:05 AM   "

## 2024-08-16 DIAGNOSIS — R7881 Bacteremia: Secondary | ICD-10-CM | POA: Diagnosis not present

## 2024-08-16 DIAGNOSIS — L89159 Pressure ulcer of sacral region, unspecified stage: Secondary | ICD-10-CM | POA: Diagnosis not present

## 2024-08-16 DIAGNOSIS — B9561 Methicillin susceptible Staphylococcus aureus infection as the cause of diseases classified elsewhere: Secondary | ICD-10-CM | POA: Diagnosis not present

## 2024-08-16 DIAGNOSIS — N179 Acute kidney failure, unspecified: Secondary | ICD-10-CM | POA: Diagnosis not present

## 2024-08-16 LAB — GLUCOSE, CAPILLARY
Glucose-Capillary: 81 mg/dL (ref 70–99)
Glucose-Capillary: 73 mg/dL (ref 70–99)
Glucose-Capillary: 114 mg/dL — ABNORMAL HIGH (ref 70–99)
Glucose-Capillary: 111 mg/dL — ABNORMAL HIGH (ref 70–99)
Glucose-Capillary: 86 mg/dL (ref 70–99)

## 2024-08-16 LAB — CULTURE, BLOOD (ROUTINE X 2): Special Requests: ADEQUATE

## 2024-08-16 LAB — COMPREHENSIVE METABOLIC PANEL WITH GFR
ALT: 13 U/L (ref 0–44)
AST: 65 U/L — ABNORMAL HIGH (ref 15–41)
Albumin: 3 g/dL — ABNORMAL LOW (ref 3.5–5.0)
Alkaline Phosphatase: 69 U/L (ref 38–126)
Anion gap: 5 (ref 5–15)
BUN: 22 mg/dL (ref 8–23)
CO2: 35 mmol/L — ABNORMAL HIGH (ref 22–32)
Calcium: 8.7 mg/dL — ABNORMAL LOW (ref 8.9–10.3)
Chloride: 102 mmol/L (ref 98–111)
Creatinine, Ser: 0.87 mg/dL (ref 0.61–1.24)
GFR, Estimated: >60 mL/min
Glucose, Bld: 92 mg/dL (ref 70–99)
Potassium: 4.2 mmol/L (ref 3.5–5.1)
Sodium: 142 mmol/L (ref 135–145)
Total Bilirubin: 0.3 mg/dL (ref 0.0–1.2)
Total Protein: 5.4 g/dL — ABNORMAL LOW (ref 6.5–8.1)

## 2024-08-16 LAB — CBC
HCT: 29.7 % — ABNORMAL LOW (ref 39.0–52.0)
Hemoglobin: 9.4 g/dL — ABNORMAL LOW (ref 13.0–17.0)
MCH: 35.6 pg — ABNORMAL HIGH (ref 26.0–34.0)
MCHC: 31.6 g/dL (ref 30.0–36.0)
MCV: 112.5 fL — ABNORMAL HIGH (ref 80.0–100.0)
Platelets: 89 K/uL — ABNORMAL LOW (ref 150–400)
RBC: 2.64 MIL/uL — ABNORMAL LOW (ref 4.22–5.81)
RDW: 14.4 % (ref 11.5–15.5)
WBC: 5.5 K/uL (ref 4.0–10.5)
nRBC: 0 % (ref 0.0–0.2)

## 2024-08-16 MED ORDER — HALOPERIDOL LACTATE 5 MG/ML IJ SOLN
1.0000 mg | Freq: Once | INTRAMUSCULAR | Status: AC
  Administered 2024-08-17: 1 mg via INTRAVENOUS
  Filled 2024-08-16: qty 1

## 2024-08-16 NOTE — Evaluation (Signed)
 Physical Therapy Evaluation Patient Details Name: [name] MRN: [id number] DOB: 01/02/1951 Today's Date: 08/16/2024  History of Present Illness  74 yo M Presents to ED 08/13/24 after slid out of bed, DC from rehab 2 days prior, mostly bedbound. Patient hypotensive, chronic hypoxia and hypercarbic respiratory failure.    PMHx: CAD, HTN, HLD, inclusion body myositis, DVT, chonic LB edema, paralyzed Rt hemidiaphragm  Clinical Impression  Pt admitted with above diagnosis.  Pt currently with functional limitations due to the deficits listed below (see PT Problem List). Pt will benefit from acute skilled PT to increase their independence and safety with mobility to allow discharge.      The patient reports that his family is available to assist at DC. Patient reports generally in bed since going to rehab. Patient reports having  power devices for mobility.  Patient required max assist of 2  to stand , use of STEDY  Transfer device for safe  transfer from recliner to bed.Patient my want to consider a transfer device for home use.   Patient remained on 4 L [place], SPo2 >90%, HR 90-114.  Recommend HHPT if  has  family support, patient  most likely has not declined in function since Dc from SNF, as patient reports the benefit was limited.     If plan is discharge home, recommend the following: Two people to help with walking and/or transfers;A lot of help with bathing/dressing/bathroom;Assist for transportation   Can travel by private vehicle        Equipment Recommendations  (may want to consider transfer assist  device)  Recommendations for Other Services       Functional Status Assessment Patient has had a recent decline in their functional status and/or demonstrates limited ability to make significant improvements in function in a reasonable and predictable amount of time     Precautions / Restrictions Precautions Precautions: Fall Precaution/Restrictions Comments: monitor  sats/HR Restrictions Weight Bearing Restrictions Per Provider Order: No      Mobility  Bed Mobility Overal bed mobility: Needs Assistance Bed Mobility: Rolling, Sit to Supine Rolling: Contact guard assist     Sit to supine: Mod assist   General bed mobility comments: assist the legs onto bed, tends to flop  trunk to side when legs lifted onto bed    Transfers Overall transfer level: Needs assistance   Transfers: Sit to/from Stand, Bed to chair/wheelchair/BSC Sit to Stand: +2 physical assistance, +2 safety/equipment           General transfer comment: max assist of 2  and chux pad to lift buttocks to power up to stand in STEDY, patient pulling up on horizontal bar of  STEDY,  STEDY used to trnasfer to bed, patient able to pull to stand while in STEDY to  return  to sitting onto bed.. Transfer via Lift Equipment: Stedy  Ambulation/Gait                  Stairs            Wheelchair Mobility     Tilt Bed    Modified Rankin (Stroke Patients Only)       Balance Overall balance assessment: Needs assistance, History of Falls Sitting-balance support: Feet supported, No upper extremity supported Sitting balance-Leahy Scale: Fair     Standing balance support: Reliant on assistive device for balance, During functional activity, Bilateral upper extremity supported Standing balance-Leahy Scale: Poor Standing balance comment: when standing in STEDY, tends to lean forward  Pertinent Vitals/Pain Pain Assessment Pain Assessment: Faces Faces Pain Scale: Hurts even more Pain Location: neck Pain Descriptors / Indicators: Aching, Discomfort, Grimacing Pain Intervention(s): Monitored during session, Repositioned    Home Living Family/patient expects to be discharged to:: Private residence Living Arrangements: Spouse/significant other;Children   Type of Home: House Home Access: Level entry       Home Layout: One  level Home Equipment: Agricultural Consultant (2 wheels);Cane - single point;Wheelchair - power;Grab bars - tub/shower;Shower seat;Grab bars - toilet Additional Comments: Pt/spouse live in basement with ground level entry. Spouse uses WC for mobility, reports has 3 power chairs    Prior Function Prior Level of Function : Needs assist             Mobility Comments: since  DC from SNF 2 days prior to adm, was  bedbound       Extremity/Trunk Assessment   Upper Extremity Assessment Upper Extremity Assessment: Generalized weakness (noted decreased fine motor of hands)    Lower Extremity Assessment Lower Extremity Assessment: Generalized weakness    Cervical / Trunk Assessment Cervical / Trunk Assessment: Other exceptions Cervical / Trunk Exceptions: noted head forward posture,  Communication   Communication Communication: Impaired Factors Affecting Communication: Hearing impaired    Cognition Arousal: Alert Behavior During Therapy: WFL for tasks assessed/performed   PT - Cognitive impairments: No apparent impairments                         Following commands: Intact       Cueing       General Comments      Exercises     Assessment/Plan    PT Assessment Patient needs continued PT services  PT Problem List Decreased strength;Decreased activity tolerance;Decreased mobility;Decreased safety awareness;Cardiopulmonary status limiting activity       PT Treatment Interventions DME instruction;Functional mobility training;Therapeutic activities;Therapeutic exercise;Patient/family education    PT Goals (Current goals can be found in the Care Plan section)  Acute Rehab PT Goals Patient Stated Goal: go home, my wife and children will help PT Goal Formulation: With patient Time For Goal Achievement: 08/30/24 Potential to Achieve Goals: Fair    Frequency Min 2X/week     Co-evaluation               AM-PAC PT 6 Clicks Mobility  Outcome Measure Help  needed turning from your back to your side while in a flat bed without using bedrails?: A Little Help needed moving from lying on your back to sitting on the side of a flat bed without using bedrails?: A Lot Help needed moving to and from a bed to a chair (including a wheelchair)?: Total Help needed standing up from a chair using your arms (e.g., wheelchair or bedside chair)?: Total Help needed to walk in hospital room?: Total Help needed climbing 3-5 steps with a railing? : Total 6 Click Score: 9    End of Session   Activity Tolerance: Patient tolerated treatment well;Patient limited by fatigue Patient left: in bed;with call bell/phone within reach Nurse Communication: Mobility status PT Visit Diagnosis: Unsteadiness on feet (R26.81);Muscle weakness (generalized) (M62.81)    Time: 8891-8847 PT Time Calculation (min) (ACUTE ONLY): 44 min   Charges:   PT Evaluation $PT Eval Low Complexity: 1 Low PT Treatments $Therapeutic Activity: 23-37 mins PT General Charges $$ ACUTE PT VISIT: 1 Visit         Darice Potters PT Acute Rehabilitation Services Office [phone]   [place],  Darice Norris 08/16/2024, 1:40 PM

## 2024-08-16 NOTE — Progress Notes (Signed)
" °   08/16/24 0135  BiPAP/CPAP/SIPAP  BiPAP/CPAP/SIPAP Pt Type Adult  BiPAP/CPAP/SIPAP SERVO  Mask Type Full face mask  Dentures removed? Yes - Placed in denture cup  Mask Size Large  Set Rate 15 breaths/min  Respiratory Rate 21 breaths/min  IPAP 10 cmH20  EPAP 5 cmH2O  FiO2 (%) 40 %  Minute Ventilation 12.2  Leak 24  Peak Inspiratory Pressure (PIP) 10  Tidal Volume (Vt) 519  Patient Home Machine No  Patient Home Mask No  Patient Home Tubing No  Auto Titrate No  Press High Alarm 25 cmH2O  Press Low Alarm 5 cmH2O  Device Plugged into RED Power Outlet Yes    "

## 2024-08-16 NOTE — Progress Notes (Addendum)
" ° °  [facility] has been requested to perform a transesophageal echocardiogram on [name] for MRSA bacteremia.     The patient does NOT have any absolute or relative contraindications to a Transesophageal Echocardiogram (TEE).  The patient has: No other conditions that may impact this procedure.  Patient is currently on 3 L nasal cannula and has a diagnosis of OSA.    After careful review of history and examination, the risks and benefits of transesophageal echocardiogram have been explained including risks of esophageal damage, perforation (1:10,000 risk), bleeding, pharyngeal hematoma as well as other potential complications associated with conscious sedation including aspiration, arrhythmia, respiratory failure and death. Alternatives to treatment were discussed, questions were answered. Patient is willing to proceed.   Bonney Morse Clause, PA-C  08/16/2024 3:25 PM   "

## 2024-08-16 NOTE — TOC Initial Note (Addendum)
 Transition of Care [facility]) - Initial/Assessment Note    Patient Details  Name: [name] MRN: [id number] Date of Birth: September 05, 1950  [name]) CM/SW Contact:    [name] ONEIDA Anon, RN Phone Number: 08/16/2024, 11:44 AM  Clinical Narrative:                 Pt is from home. Recent SNF stay for STR. Pt came into the ED with complaints of fall, without injuries and unable to urinate. Pt admitted with diagnosis of AKI (acute kidney injury). DME orders for NIV placed and sent to [facility] with [facility] [phone]. Received notification from [name] that pt has been approved for home NIV and can be setup in the home once pt is discharged. PT/OT consulted, awaiting recommendations. Pt to have TEE completed 1/23. ICM will continue to follow for DC planning needs.  RNCM met with pt and wife at bedside. States pt has a motorized wheelchair in the home that he uses to mobilize throughout the home. Pt and spouse live in the home with daughter [name]. Pt also has walker and BSC at home. Pt spouse states HH was set up with [facility] when he was discharged from the SNF. Will need HH PT/OT orders placed at discharge. [facility] DME orders sent to [employer] and will be delivered to pt home.     Expected Discharge Plan: Home w Home Health Services Barriers to Discharge: Continued Medical Work up (Pt scheduled for TEE on 1/23)   Patient Goals and CMS Choice Patient states their goals for this hospitalization and ongoing recovery are:: Home CMS Medicare.gov Compare Post Acute Care list provided to:: Patient Choice offered to / list presented to : Patient [place] ownership interest in [facility].provided to:: Patient    Expected Discharge Plan and Services In-house Referral: NA Discharge Planning Services: CM Consult Post Acute Care Choice: Durable Medical Equipment Living arrangements for the past 2 months: Single Family Home                 DME Arranged: NIV DME Agency:  Other - Comment ([facility]) Date DME Agency Contacted: 08/15/24 Time DME Agency Contacted: 1130 Representative spoke with at DME Agency: [name] [phone]            Prior Living Arrangements/Services Living arrangements for the past 2 months: Single Family Home Lives with:: Relatives Patient language and need for interpreter reviewed:: Yes Do you feel safe going back to the place where you live?: Yes      Need for Family Participation in Patient Care: Yes (Comment) Care giver support system in place?: Yes (comment) Current home services: DME Criminal Activity/Legal Involvement Pertinent to Current Situation/Hospitalization: No - Comment as needed  Activities of Daily Living   ADL Screening (condition at time of admission) Independently performs ADLs?: No Does the patient have a NEW difficulty with bathing/dressing/toileting/self-feeding that is expected to last >3 days?: No Does the patient have a NEW difficulty with getting in/out of bed, walking, or climbing stairs that is expected to last >3 days?: No Does the patient have a NEW difficulty with communication that is expected to last >3 days?: No Is the patient deaf or have difficulty hearing?: No Does the patient have difficulty seeing, even when wearing glasses/contacts?: No Does the patient have difficulty concentrating, remembering, or making decisions?: No  Permission Sought/Granted Permission sought to share information with : Family Supports    Share Information with NAME:  [name] pt daughter [phone]           Emotional Assessment Appearance:: Other (Comment Required (UTA) Attitude/Demeanor/Rapport: Unable to Assess Affect (typically observed): Unable to Assess   Alcohol / Substance Use: Not Applicable Psych Involvement: No (comment)  Admission diagnosis:  AKI (acute kidney injury) [N17.9] Acute renal failure, unspecified acute renal failure type [N17.9] Deep  vein thrombosis (DVT) of other vein of lower extremity, unspecified chronicity, unspecified laterality (HCC) [I82.499] Patient Active Problem List   Diagnosis Date Noted   Obesity, class 3 (HCC) 07/18/2024   Septic shock (HCC) 07/12/2024   Acute on chronic diastolic CHF (congestive heart failure) (HCC) 07/09/2024   Cellulitis of right lower extremity 07/09/2024   Elevated CK 07/09/2024   AKI (acute kidney injury) 07/09/2024   History of DVT (deep vein thrombosis) 03/04/2022   Unilateral primary osteoarthritis, right hip 02/26/2020   Chronic pain syndrome 09/13/2019   Macrocytic anemia 06/12/2019   Screening for viral disease 06/12/2019   S/P cervical spinal fusion 05/22/2019   Cervical facet joint syndrome 05/22/2019   Chronic radicular lumbar pain 05/22/2019   Lumbar degenerative disc disease 05/22/2019   Lumbar spondylosis 05/22/2019   Lumbar facet arthropathy 05/22/2019   Insomnia 05/16/2019   Bilateral lower extremity edema 04/04/2019   BMI 37.0-37.9, adult 07/20/2018   Encounter for Medicare annual wellness exam 04/27/2018   Colon cancer screening 04/27/2018   Flu vaccine need 04/27/2018   Healthcare maintenance 02/15/2018   Stented coronary artery    Inclusion body myositis (HCC)    Hyperlipidemia    Coronary artery disease    Spinal stenosis    Weakness    HTN (hypertension)    PCP:  [name] Aleene DEL, MD Pharmacy:   [facility] [address] [place] [address] Phone: [phone] Fax: [phone]     Social Drivers of Health (SDOH) Social History: SDOH Screenings   Food Insecurity: No Food Insecurity (08/13/2024)  Housing: Low Risk (08/13/2024)  Transportation Needs: No Transportation Needs (08/13/2024)  Utilities: Not At Risk (08/13/2024)  Alcohol Screen: Low Risk (06/05/2024)  Depression (PHQ2-9): Low Risk (12/28/2023)  Financial Resource Strain: Low Risk (06/05/2024)  Physical Activity: Inactive  (06/05/2024)  Social Connections: Socially Isolated (08/13/2024)  Stress: No Stress Concern Present (06/05/2024)  Tobacco Use: Medium Risk (08/13/2024)  Health Literacy: Adequate Health Literacy (12/28/2023)   SDOH Interventions:     Readmission Risk Interventions    08/16/2024   11:30 AM  Readmission Risk Prevention Plan  Transportation Screening Complete  PCP or Specialist Appt within 5-7 Days Complete  Home Care Screening Complete  Medication Review (RN CM) Complete

## 2024-08-16 NOTE — Plan of Care (Signed)
  Problem: Clinical Measurements: Goal: Respiratory complications will improve Outcome: Progressing   Problem: Coping: Goal: Level of anxiety will decrease Outcome: Progressing   Problem: Pain Managment: Goal: General experience of comfort will improve and/or be controlled Outcome: Progressing   Problem: Safety: Goal: Ability to remain free from injury will improve Outcome: Progressing

## 2024-08-16 NOTE — Progress Notes (Signed)
" °   08/16/24 2207  BiPAP/CPAP/SIPAP  BiPAP/CPAP/SIPAP Pt Type Adult  BiPAP/CPAP/SIPAP SERVO  Mask Type Full face mask  Mask Size Large  Set Rate 0 breaths/min  Respiratory Rate 18 breaths/min  IPAP 12 cmH20  EPAP 6 cmH2O  Flow Rate 2 lpm  Patient Home Machine No  Patient Home Mask No  Patient Home Tubing No  Auto Titrate No  Device Plugged into RED Power Outlet Yes    "

## 2024-08-16 NOTE — H&P (View-Only) (Signed)
 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  Patient placed on cefazolin . - I looked at his right lower extremity wound, no concern for active infection.  Image review of sacral wounds does show some erythema I suspect that is the source of bacteremia.   Recommendations:  -Repeat blood cultures to ensure clearance - Continue cefazolin  - TTE no vegetation, TEE planned for tomorrow - Continue wound care - Standard precautions   Microbiology:   Cefazolin  1/20-present   Cultures: Blood 1/19 1 out of 2 sets,2/4 bottles growing MSSA and staph epi 1/21 pending Urine   SUBJECTIVE: Sitting in bed.  Wife at bedside. Interval: Afebrile overnight.  WBC 5.5 K  Review of Systems: Review of Systems  All other systems reviewed and are negative.    Scheduled Meds:  apixaban   2.5 mg Oral BID   Chlorhexidine  Gluconate Cloth  6 each Topical Daily   ezetimibe   10 mg Oral QPM   liver oil-zinc  oxide  1 Application Topical BID   thiamine   100 mg Oral Daily   Continuous Infusions:   ceFAZolin  (ANCEF ) IV Stopped (08/16/24 1100)   PRN Meds:.acetaminophen  **OR** acetaminophen , albuterol , ondansetron  **OR** ondansetron  (ZOFRAN ) IV, mouth rinse, traZODone  Allergies[1]  OBJECTIVE: Vitals:   08/16/24 0900 08/16/24 1000 08/16/24 1100 08/16/24 1148  BP: (!) 147/47 (!) 151/58 (!) 114/46   Pulse:  95 89 97 93  Resp: (!) 22 (!) 25 20 (!) 21  Temp:    (!) 97.4 F (36.3 C)  TempSrc:    Oral  SpO2: 98% 100% (!) 87% 99%  Weight:      Height:       Body mass index is 37.97 kg/m.  Physical Exam Constitutional:      General: He is not in acute distress.    Appearance: He is normal weight. He is not toxic-appearing.  HENT:     Head: Normocephalic and atraumatic.     Right Ear: External ear normal.     Left Ear: External ear normal.     Nose: No congestion or rhinorrhea.     Mouth/Throat:     Mouth: Mucous membranes are moist.     Pharynx: Oropharynx is clear.  Eyes:     Extraocular Movements: Extraocular movements intact.     Conjunctiva/sclera: Conjunctivae normal.     Pupils: Pupils are equal, round, and reactive to light.  Cardiovascular:     Rate and Rhythm: Normal rate and regular rhythm.     Heart sounds: No murmur heard.    No friction rub. No gallop.  Pulmonary:     Effort: Pulmonary effort is normal.     Breath sounds: Normal breath sounds.  Abdominal:     General: Abdomen is flat. Bowel sounds are normal.     Palpations: Abdomen is soft.  Musculoskeletal:     Cervical back: Normal range of motion and neck  supple.  Skin:    General: Skin is warm and dry.  Neurological:     General: No focal deficit present.     Mental Status: He is oriented to person, place, and time.  Psychiatric:        Mood and Affect: Mood normal.       Lab Results Lab Results  Component Value Date   WBC 5.5 08/16/2024   HGB 9.4 (L) 08/16/2024   HCT 29.7 (L) 08/16/2024   MCV 112.5 (H) 08/16/2024   PLT 89 (L) 08/16/2024    Lab Results  Component Value Date   CREATININE 0.87 08/16/2024   BUN 22 08/16/2024   NA 142 08/16/2024   K 4.2 08/16/2024   CL 102 08/16/2024   CO2 35 (H) 08/16/2024    Lab Results  Component Value Date   ALT 13 08/16/2024   AST 65 (H) 08/16/2024   ALKPHOS 69 08/16/2024   BILITOT 0.3 08/16/2024        Loney Stank, MD [facility] 08/16/2024, 1:36 PM Evaluation of this patient requires complex antimicrobial therapy evaluation and counseling + isolation needs for disease transmission risk assessment and mitigation      [1] No Known Allergies  "

## 2024-08-16 NOTE — Progress Notes (Signed)
 " PROGRESS NOTE    [name]  FMW:[id number] DOB: [birth year] DOA: 08/13/2024 PCP: Candise Aleene DEL, MD    Brief Narrative:   [name] is a 74 y.o. adult with past medical history significant for inclusion body myositis, HTN, history of DVT on Eliquis , chronic diastolic congestive heart failure, chronic hypoxic respite failure (2-4L [place] baseline), venous insufficiency with chronic bilateral lower extremity edema, right lower extremity wounds (follows with wound care clinc), chronic hypoxic/hypercarbic respiratory failure, OSA, CAD, HLD, obesity who presented to [facility] ED on 08/13/2024 from home via EMS due to fall, apparently slid out of his bed, increased weakness.  Patient denied loss of consciousness, did not strike his head.  Recently discharged from SNF a few days prior.  Reports continue to take his prescribed occasions but has been taking a few extra doses of his torsemide  over the last few days to attempt to improve his lower extremity edema.  In the ED, temperature 97.5 F, HR 71, RR 70/35, SpO2 73% on room air.  WBC 9.3, hemoglobin 10.9, platelet count 100.  Sodium 138, potassium 4.8, chloride 92, CO2 34, glucose 96, BUN 49, creat 3.14.  AST 82, ALT 20, total bilirubin 0.6.  BNP 713.0.  High-sensitivity troponin 306 followed by 261.  Lactic acid 2.2 followed by 1.6.  Chest x-ray with persistent low lung volumes with chronic elevation of the right hemidiaphragm and increased right basilar atelectasis.  Blood cultures x 2 obtained.  EKG with sinus rhythm, rate 60, QTc 501, no concerning ST elevation/depressions or T wave inversions.  Patient received 1.5 L LR bolus in the ED.  TRH consulted for admission for further evaluation and management of fall, weakness, hypotension.  Significant Hospital events: 1/19: Admit TRH, received 3.5 L IVF, 12.5 g albumin  x 2, started on midodrine  1/20: Transferred to ICU, PCCM consulted, discontinue midodrine  due to reflex bradycardia, started on  norepinephrine , started on cefazolin  for positive blood culture with MSSA, staph epi.  Infectious disease consulted 1/21: Norepinephrine  titrated off 1/22: Remains off of vasopressors, transfer to telemetry, PCCM signed off, TEE planned for 1/23  Assessment & Plan:   Shock, hypovolemic vs septic MSSA bacteremia Staphylococcus epidermidis bacteremia Patient presenting after fall from home, apparently slid out of his bed.  Recently discharged from SNF a few days prior.  Recently admitted for septic shock secondary to lower extremity cellulitis/wounds.  Patient is afebrile without leukocytosis.  Lactic acid elevated 2.2.  Blood pressure 70/35.  Apparently has been taking additional doses of his oral diuretic to help with his lower extremity edema over the last few days.  Suspect etiology likely secondary to hypovolemia versus cardiogenic.  Patient received 3.5 L of aggressive IV fluid resuscitation, 25 g of IV albumin  and started on midodrine  without significant improvement of blood pressure.  TTE with LVEF 7975%, LV with no regional wall motion normalities, LV diastolic parameters indeterminate, LA mildly dilated, RA moderately dilated, mild dilation ascending aorta measuring 41 mm, IVC dilated in size no vegetations noted. -- Infectious disease following, appreciate assistance -- Cardiology consulted for TEE -- Blood cultures x 2: Left hand + GPC's; BCID positive for MSSA, staph epi; unable to isolate further per culture -- Blood cultures 1/21: Pending -- Holding home atenolol , lisinopril , tamsulosin , torsemide  -- Cefazolin  2 g IV every 8 hours -- Plan TEE 1/23, n.p.o. 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  Was 0.64 on 07/20/2024.  Likely secondary to prerenal azotemia from excessive  diuretic use versus ATN from hypotension.  Received aggressive IV fluid resuscitation and IV albumin  on admission. -- Cr 3.14>2.67>1.72>0.87 -- Holding home  lisinopril  and atenolol  -- Foley placed to monitor urine output, 1300 mL past 24 hours -- Continue Foley catheter, can consider voiding trial in 1-2 days -- Strict I's and O's  Elevated LFTs AST elevated 82, ALT 20.  Unclear etiology, suspect mild shock liver due to hypotension as above. -- AST 82>86>65 -- ALT 20>21>13 -- Tbili 0.6>0.3>0.3 -- Holding home statin -- CMP in a.m.  HTN Chronic diastolic congestive heart failure, compensated -- Hold home atenolol , torsemide  and lisinopril  -- Strict I's and O's and daily weights  Peripheral vascular disease Venous insufficiency Right lower extremity wounds Sacral wound Patient follows with wound clinic outpatient, Dr. Marolyn Wound 07/10/24 Pressure Injury Buttocks Right;Left Deep Tissue Pressure Injury - Purple or maroon localized area of discolored intact skin or blood-filled blister due to damage of underlying soft tissue from pressure and/or shear. (Active)     Wound 08/13/24 1840 Pressure Injury Coccyx Mid Stage 2 -  Partial thickness loss of dermis presenting as a shallow open injury with a red, pink wound bed without slough. (Active)     Wound 08/13/24 1840 Pressure Injury Buttocks Right;Left;Medial Deep Tissue Pressure Injury - Purple or maroon localized area of discolored intact skin or blood-filled blister due to damage of underlying soft tissue from pressure and/or shear. (Active)     Wound 08/13/24 1841 Pressure Injury Ischial tuberosity Right;Left Deep Tissue Pressure Injury - Purple or maroon localized area of discolored intact skin or blood-filled blister due to damage of underlying soft tissue from pressure and/or shear. (Active)  -- Wound RN consulted -- ABI 1/20 abnormal with noncompressible right and left lower extremity arteries. 1. Cleanse sacrum/coccyx with NS, apply a small piece of (Aquacel AG) silver hydrofiber to coccyx and secure with silicone foam. 2. Cleanse B ischium/posterior thighs with NS, apply Xeroform  gauze to wounds daily and secure with silicone foam. 3. If patient is still in the hospital on Wed, 1/28, please change right leg dressing as follows: Cut piece of Aquacel (#[id number]) and apply to right posterior leg, then cover with foam dressing.  Moisten with NS each time to remove previous dressing.  Apply kerlex, beginning just behind toes to below knee, then ace wrap in the same manner.    -- Would benefit from vascular surgery follow-up outpatient  History of DVT -- Eliquis  2.5 m p.o. twice daily  CAD HLD -- Zetia  10 mg p.o. daily -- Hold home Crestor  due to elevated LFTs  OSA Patient was to be on BiPAP after discharge from SNF.  Family working on obtaining device.  Has been off since. -- BiPAP -- TOC consulted for home DME NIV  Mr Anastasi requires volume targeted ventilation via NIV/HMV for nocturnal and daytime use for CRF secondary to Obesity Hypoventilation Syndrome.  PaCO2 confirmed at 54 on ABG. Alarms and internal battery are required to ensure continuous support and immediate recognition of ventilatory insufficiency. A RAD with backup rate is insufficient due to the severity of illness. Volume ventilation is needed to reduce work of breathing and fatigue and to prevent life threatening exacerbations. OSA is not the predominant cause of hypercapnia.  BPH -- Holding tamsulosin  due to hypotension as above  Obesity, class II Body mass index is 37.97 kg/m.    DVT prophylaxis: apixaban  (ELIQUIS ) tablet 2.5 mg Start: 08/13/24 2200 apixaban  (ELIQUIS ) tablet 2.5 mg    Code Status:  Full Code Family Communication:   Disposition Plan:  Level of care: Telemetry Status is: Inpatient Remains inpatient appropriate because: IV antibiotics, transfer to telemetry unit, awaiting TEE planned for tomorrow, finalized ID recommendations, PT/OT evaluation    Consultants:  PCCM Infectious disease Cardiology  Procedures:  TTE:  TEE: Pending  Antimicrobials:   Cefazolin    Subjective: Patient seen examined bedside, lying in bed.  Sleeping but easily arousable.  No family present at bedside.  Remains off of norepinephrine .  Did not wear BiPAP mask overnight due to mask falls off.  Discussed with RN.  TEE planned for tomorrow.  Stable for transfer to telemetry floor.  Patient continues with generalized weakness/fatigue, otherwise no other complaints.  Discussed with daughter who is a teacher, early years/pre at bedside.  Patient with no other questions or concerns.  Denies headache, no chest pain, no palpitations, no abdominal pain, no fever/chills, no nausea/vomiting/diarrhea.  Objective: Vitals:   08/16/24 0800 08/16/24 0900 08/16/24 1000 08/16/24 1100  BP: (!) 92/59 (!) 147/47 (!) 151/58 (!) 114/46  Pulse: 88 95 89 97  Resp: 20 (!) 22 (!) 25 20  Temp:      TempSrc:      SpO2: 95% 98% 100% (!) 87%  Weight:      Height:        Intake/Output Summary (Last 24 hours) at 08/16/2024 1103 Last data filed at 08/16/2024 1100 Gross per 24 hour  Intake 567.3 ml  Output 1625 ml  Net -1057.7 ml   Filed Weights   08/13/24 1842  Weight: 123.5 kg    Examination:  Physical Exam: GEN: NAD, alert and oriented x 3, obese, chronically ill in appearance HEENT: NCAT, PERRL, EOMI, sclera clear, MMM PULM: Breath sounds slight diminished our bases, no wheezes/crackles, normal respiratory effort without accessory muscle use, on 3 L nasal cannula with SpO2 100% at rest (baseline 2-4L) CV: RRR w/o M/G/R GI: abd soft, NTND, + BS MSK: + LE peripheral edema, RLE with Unna boot in place, extremities with multiple areas of ecchymosis, chronic venous changes in various stages of healing NEURO: No focal neurologic deficit PSYCH: normal mood/affect Integumentary: Right lower extremity wound, sacral wound, multiple areas of ecchymosis to extremities in various stages of healing            Data Reviewed: I have personally reviewed following labs and imaging  studies  CBC: Recent Labs  Lab 08/13/24 1234 08/14/24 0352 08/14/24 1025 08/15/24 0331 08/16/24 0350  WBC 9.3 7.9  --  7.9 5.5  HGB 10.9* 8.9* 9.2* 9.2* 9.4*  HCT 35.0* 28.0* 27.0* 28.5* 29.7*  MCV 111.1* 109.8*  --  111.3* 112.5*  PLT 100* 74*  --  105* 89*   Basic Metabolic Panel: Recent Labs  Lab 08/13/24 1234 08/14/24 0332 08/14/24 0352 08/14/24 1025 08/14/24 1531 08/15/24 0331 08/16/24 0350  NA 138  --  138 137 140 141 142  K 4.8  --  4.3 4.1 4.7 4.2 4.2  CL 92*  --  96*  --  99 100 102  CO2 34*  --  31  --  33* 34* 35*  GLUCOSE 96  --  79  --  154* 106* 92  BUN 49*  --  47*  --  45* 39* 22  CREATININE 3.14*  --  2.67*  --  2.35* 1.72* 0.87  CALCIUM  9.2  --  8.4*  --  8.4* 8.4* 8.7*  MG  --  2.0  --   --   --  2.0  --   PHOS  --   --   --   --  4.9* 3.3  --    GFR: Estimated Creatinine Clearance (by C-G formula based on SCr of 0.87 mg/dL) Male: 20.1 mL/min Male: 96.7 mL/min Liver Function Tests: Recent Labs  Lab 08/13/24 1234 08/14/24 1531 08/15/24 0331 08/16/24 0350  AST 82*  --  86* 65*  ALT 20  --  21 13  ALKPHOS 92  --  70 69  BILITOT 0.6  --  0.3 0.3  PROT 6.3*  --  5.2* 5.4*  ALBUMIN  3.6 3.2* 3.0* 3.0*   No results for input(s): LIPASE, AMYLASE in the last 168 hours. No results for input(s): AMMONIA in the last 168 hours. Coagulation Profile: No results for input(s): INR, PROTIME in the last 168 hours. Cardiac Enzymes: No results for input(s): CKTOTAL, CKMB, CKMBINDEX, TROPONINI in the last 168 hours. BNP (last 3 results) Recent Labs    04/10/24 1338 07/09/24 1230 08/13/24 1234  PROBNP 549* 909.0* 713.0*   HbA1C: No results for input(s): HGBA1C in the last 72 hours. CBG: Recent Labs  Lab 08/15/24 1657 08/15/24 1932 08/15/24 2346 08/16/24 0332 08/16/24 0812  GLUCAP 90 100* 84 81 73   Lipid Profile: No results for input(s): CHOL, HDL, LDLCALC, TRIG, CHOLHDL, LDLDIRECT in the last 72  hours. Thyroid  Function Tests: No results for input(s): TSH, T4TOTAL, FREET4, T3FREE, THYROIDAB in the last 72 hours. Anemia Panel: No results for input(s): VITAMINB12, FOLATE, FERRITIN, TIBC, IRON, RETICCTPCT in the last 72 hours. Sepsis Labs: Recent Labs  Lab 08/13/24 1241 08/13/24 1738 08/15/24 0331  LATICACIDVEN 2.2* 1.6 0.8    Recent Results (from the past 240 hours)  Blood culture (routine x 2)     Status: None (Preliminary result)   Collection Time: 08/13/24 12:28 PM   Specimen: BLOOD  Result Value Ref Range Status   Specimen Description   Final    BLOOD RIGHT ANTECUBITAL Performed at [facility], [address], [place], [place] [address]    Special Requests   Final    BOTTLES DRAWN AEROBIC AND ANAEROBIC Blood Culture adequate volume Performed at [facility], [address], [place], [place] [address]    Culture   Final    NO GROWTH 3 DAYS Performed at [facility], [address] [place], [place] [address]    Report Status PENDING  Incomplete  Blood culture (routine x 2)     Status: Abnormal   Collection Time: 08/13/24  5:38 PM   Specimen: BLOOD LEFT HAND  Result Value Ref Range Status   Specimen Description   Final    BLOOD LEFT HAND Performed at [facility], [address], [place], [place] [address]    Special Requests   Final    BOTTLES DRAWN AEROBIC AND ANAEROBIC Blood Culture adequate volume Performed at [facility], [address], [place], [place] [address]    Culture  Setup Time   Final    GRAM POSITIVE COCCI IN CLUSTERS IN BOTH AEROBIC AND ANAEROBIC BOTTLES PHARMD M. SWAYNE [phone] @ [phone] FH    Culture (A)  Final    STAPHYLOCOCCUS AUREUS STAPHYLOCOCCUS EPIDERMIDIS BY BCID UNABLE TO ISOLATE FROM CULTURE Performed at [facility], [address] [place], [place] [address]    Report Status 08/16/2024 FINAL  Final   Organism ID, Bacteria  STAPHYLOCOCCUS AUREUS  Final      Susceptibility  Staphylococcus aureus - MIC*    CIPROFLOXACIN  >=8 RESISTANT Resistant     ERYTHROMYCIN >=8 RESISTANT Resistant     GENTAMICIN <=0.5 SENSITIVE Sensitive     OXACILLIN 1 SENSITIVE Sensitive     TETRACYCLINE <=1 SENSITIVE Sensitive     VANCOMYCIN  <=0.5 SENSITIVE Sensitive     TRIMETH/SULFA >=320 RESISTANT Resistant     CLINDAMYCIN <=0.25 SENSITIVE Sensitive     RIFAMPIN <=0.5 SENSITIVE Sensitive     Inducible Clindamycin NEGATIVE Sensitive     LINEZOLID  2 SENSITIVE Sensitive     * STAPHYLOCOCCUS AUREUS  Blood Culture ID Panel (Reflexed)     Status: Abnormal   Collection Time: 08/13/24  5:38 PM  Result Value Ref Range Status   Enterococcus faecalis NOT DETECTED NOT DETECTED Final   Enterococcus Faecium NOT DETECTED NOT DETECTED Final   Listeria monocytogenes NOT DETECTED NOT DETECTED Final   Staphylococcus species DETECTED (A) NOT DETECTED Final    Comment: CRITICAL RESULT CALLED TO, READ BACK BY AND VERIFIED WITH: PHARMD M. SWAYNE [id number] @ 1647 FH    Staphylococcus aureus (BCID) DETECTED (A) NOT DETECTED Final    Comment: Methicillin (oxacillin) susceptible Staphylococcus aureus (MSSA). Preferred therapy is anti staphylococcal beta lactam antibiotic (Cefazolin  or Nafcillin), unless clinically contraindicated. CRITICAL RESULT CALLED TO, READ BACK BY AND VERIFIED WITH: PHARMD M. SWAYNE [id number] @ 1647 FH    Staphylococcus epidermidis DETECTED (A) NOT DETECTED Final    Comment: CRITICAL RESULT CALLED TO, READ BACK BY AND VERIFIED WITH: PHARMD EMERSON RAV [id number] @ 1647 FH    Staphylococcus lugdunensis NOT DETECTED NOT DETECTED Final   Streptococcus species NOT DETECTED NOT DETECTED Final   Streptococcus agalactiae NOT DETECTED NOT DETECTED Final   Streptococcus pneumoniae NOT DETECTED NOT DETECTED Final   Streptococcus pyogenes NOT DETECTED NOT DETECTED Final   A.calcoaceticus-baumannii NOT DETECTED NOT DETECTED Final   Bacteroides  fragilis NOT DETECTED NOT DETECTED Final   Enterobacterales NOT DETECTED NOT DETECTED Final   Enterobacter cloacae complex NOT DETECTED NOT DETECTED Final   Escherichia coli NOT DETECTED NOT DETECTED Final   Klebsiella aerogenes NOT DETECTED NOT DETECTED Final   Klebsiella oxytoca NOT DETECTED NOT DETECTED Final   Klebsiella pneumoniae NOT DETECTED NOT DETECTED Final   Proteus species NOT DETECTED NOT DETECTED Final   Salmonella species NOT DETECTED NOT DETECTED Final   Serratia marcescens NOT DETECTED NOT DETECTED Final   Haemophilus influenzae NOT DETECTED NOT DETECTED Final   Neisseria meningitidis NOT DETECTED NOT DETECTED Final   Pseudomonas aeruginosa NOT DETECTED NOT DETECTED Final   Stenotrophomonas maltophilia NOT DETECTED NOT DETECTED Final   Candida albicans NOT DETECTED NOT DETECTED Final   Candida auris NOT DETECTED NOT DETECTED Final   Candida glabrata NOT DETECTED NOT DETECTED Final   Candida krusei NOT DETECTED NOT DETECTED Final   Candida parapsilosis NOT DETECTED NOT DETECTED Final   Candida tropicalis NOT DETECTED NOT DETECTED Final   Cryptococcus neoformans/gattii NOT DETECTED NOT DETECTED Final   Methicillin resistance mecA/C NOT DETECTED NOT DETECTED Final   Meth resistant mecA/C and MREJ NOT DETECTED NOT DETECTED Final    Comment: Performed at [facility], [address] [place], [place] [address]  MRSA Next Gen by PCR, Nasal     Status: None   Collection Time: 08/14/24  8:45 AM   Specimen: Nasal Mucosa; Nasal Swab  Result Value Ref Range Status   MRSA by PCR Next Gen NOT DETECTED NOT DETECTED Final    Comment: (NOTE) The GeneXpert  MRSA Assay (FDA approved for NASAL specimens only), is one component of a comprehensive MRSA colonization surveillance program. It is not intended to diagnose MRSA infection nor to guide or monitor treatment for MRSA infections. Test performance is not FDA approved in patients less than 3 years old. Performed at [facility], [address], [place], [place] [address]   Culture, blood (Routine X 2) w Reflex to ID Panel     Status: None (Preliminary result)   Collection Time: 08/15/24 12:29 PM   Specimen: BLOOD LEFT ARM  Result Value Ref Range Status   Specimen Description   Final    BLOOD LEFT ARM Performed at [facility], [address], [place], [place] [address]    Special Requests   Final    BOTTLES DRAWN AEROBIC AND ANAEROBIC Blood Culture adequate volume Performed at [facility], [address] [place], [place] [address]    Culture   Final    NO GROWTH < 24 HOURS Performed at [facility], [address], [place], [place] [address]    Report Status PENDING  Incomplete  Culture, blood (Routine X 2) w Reflex to ID Panel     Status: None (Preliminary result)   Collection Time: 08/15/24 12:29 PM   Specimen: BLOOD RIGHT HAND  Result Value Ref Range Status   Specimen Description   Final    BLOOD RIGHT HAND Performed at [facility], [address] [place], [place] [address]    Special Requests   Final    BOTTLES DRAWN AEROBIC AND ANAEROBIC Blood Culture adequate volume Performed at [facility], [address] [place], [place] [address]    Culture   Final    NO GROWTH < 24 HOURS Performed at [facility], [address] [place], [place] [address]    Report Status PENDING  Incomplete         Radiology Studies: ECHOCARDIOGRAM COMPLETE Result Date: 08/14/2024    ECHOCARDIOGRAM REPORT   Patient Name:   [name] Date of Exam: 08/14/2024 Medical Rec #:  [id number]   Height:       71.0 in Accession #:    [id number]  Weight:       272.3 lb Date of Birth:  June 07, 1951   BSA:          2.404 m Patient Age:    74 years    BP:           92/33 mmHg Patient Gender: M           HR:           70 bpm. Exam Location:  Inpatient Procedure: 2D Echo, Cardiac Doppler, Color Doppler and Intracardiac            Opacification  Agent (Both Spectral and Color Flow Doppler were            utilized during procedure). Indications:    Shock  History:        Patient has prior history of Echocardiogram examinations, most                 recent 07/10/2024. CHF, CAD, Abnormal ECG, Arrythmias:Atrial                 Fibrillation, Signs/Symptoms:Bacteremia; Risk                 Factors:Hypertension.  Sonographer:    Ellouise Mose RDCS Referring Phys:  [id number] Elonzo Sopp J Jayda White  Sonographer Comments: Technically difficult study due to poor echo windows, suboptimal parasternal window, suboptimal apical window and suboptimal subcostal window. Study was originally ordered as limited, then MD changed to complete with definity . IMPRESSIONS  1. Left ventricular ejection fraction, by estimation, is 70 to 75%. The left ventricle has hyperdynamic function. The left ventricle has no regional wall motion abnormalities. Left ventricular diastolic parameters are indeterminate.  2. Right ventricular systolic function is moderately reduced. The right ventricular size is severely enlarged. Mildly increased right ventricular wall thickness. There is severely elevated pulmonary artery systolic pressure. The estimated right ventricular systolic pressure is 66.0 mmHg.  3. Left atrial size was mildly dilated.  4. Right atrial size was moderately dilated.  5. The mitral valve is grossly normal. No evidence of mitral valve regurgitation. No evidence of mitral stenosis.  6. Tricuspid valve regurgitation is mild to moderate.  7. The aortic valve is grossly normal. Aortic valve regurgitation is not visualized.  8. Aortic dilatation noted. There is mild dilatation of the ascending aorta, measuring 41 mm.  9. The inferior vena cava is dilated in size with <50% respiratory variability, suggesting right atrial pressure of 15 mmHg. Comparison(s): Prior images reviewed side by side. Changes from prior study are noted. Right ventricular size has increased and right ventricular function has  decreased. FINDINGS  Left Ventricle: Left ventricular ejection fraction, by estimation, is 70 to 75%. The left ventricle has hyperdynamic function. The left ventricle has no regional wall motion abnormalities. Definity  contrast agent was given IV to delineate the left ventricular endocardial borders. The left ventricular internal cavity size was normal in size. There is borderline concentric left ventricular hypertrophy. Left ventricular diastolic function could not be evaluated due to atrial fibrillation. Left ventricular diastolic parameters are indeterminate. Right Ventricle: The right ventricular size is severely enlarged. Mildly increased right ventricular wall thickness. Right ventricular systolic function is moderately reduced. There is severely elevated pulmonary artery systolic pressure. The tricuspid regurgitant velocity is 3.57 m/s, and with an assumed right atrial pressure of 15 mmHg, the estimated right ventricular systolic pressure is 66.0 mmHg. Left Atrium: Left atrial size was mildly dilated. Right Atrium: Right atrial size was moderately dilated. Pericardium: There is no evidence of pericardial effusion. Mitral Valve: The mitral valve is grossly normal. Mild mitral annular calcification. No evidence of mitral valve regurgitation. No evidence of mitral valve stenosis. Tricuspid Valve: The tricuspid valve is grossly normal. Tricuspid valve regurgitation is mild to moderate. No evidence of tricuspid stenosis. Aortic Valve: The aortic valve is grossly normal. Aortic valve regurgitation is not visualized. Pulmonic Valve: The pulmonic valve was grossly normal. Pulmonic valve regurgitation is mild. Aorta: Aortic dilatation noted. There is mild dilatation of the ascending aorta, measuring 41 mm. Venous: The inferior vena cava is dilated in size with less than 50% respiratory variability, suggesting right atrial pressure of 15 mmHg. IAS/Shunts: The interatrial septum was not well visualized.  LEFT VENTRICLE  PLAX 2D LVIDd:         4.00 cm LVIDs:         2.70 cm LV PW:         1.00 cm LV IVS:        1.20 cm  LV Volumes (MOD) LV vol d, MOD A2C: 109.7 ml LV vol d, MOD A4C: 112.0 ml LV vol s, MOD A2C: 25.5 ml LV vol s, MOD A4C: 41.3 ml LV SV MOD A2C:     84.3 ml LV SV MOD  A4C:     112.0 ml LV SV MOD BP:      78.9 ml IVC IVC diam: 2.45 cm RIGHT ATRIUM           Index RA Area:     25.10 cm RA Volume:   80.90 ml  33.65 ml/m  AORTIC VALVE LVOT Vmax:   89.70 cm/s LVOT Vmean:  56.000 cm/s LVOT VTI:    0.194 m  AORTA Ao Asc diam: 4.03 cm MITRAL VALVE                TRICUSPID VALVE MV Area (PHT): 3.65 cm     TR Peak grad:   51.0 mmHg MV Decel Time: 208 msec     TR Vmax:        357.00 cm/s MV E velocity: 102.00 cm/s                             SHUNTS                             Systemic VTI: 0.19 m Jerel Croitoru MD Electronically signed by Jerel Balding MD Signature Date/Time: 08/14/2024/7:26:01 PM    Final    VAS US  ABI WITH/WO TBI Result Date: 08/14/2024  LOWER EXTREMITY DOPPLER STUDY Patient Name:  [name]  Date of Exam:   08/14/2024 Medical Rec #: [id number]    Accession #:    [id number] Date of Birth: [birth year]    Patient Gender: M Patient Age:   17 years Exam Location:  [facility] Procedure:      VAS US  ABI WITH/WO TBI Referring Phys: Kaelani Kendrick --------------------------------------------------------------------------------  Indications: Peripheral artery disease. High Risk Factors: Hypertension, hyperlipidemia, past history of smoking,                    coronary artery disease.  Vascular Interventions: CHF, Lymphedema, Hx of DVT and venous stasis disease. Limitations: Today's exam was limited due to continuous patient movement,              tough/thickened skin. Comparison Study: Previous exam 03/04/2022 Performing Technologist: Leigh Rom RVT, RDMS  Examination Guidelines: A complete evaluation includes at minimum, Doppler waveform signals and systolic blood pressure reading at the level of bilateral  brachial, anterior tibial, and posterior tibial arteries, when vessel segments are accessible. Bilateral testing is considered an integral part of a complete examination. Photoelectric Plethysmograph (PPG) waveforms and toe systolic pressure readings are included as required and additional duplex testing as needed. Limited examinations for reoccurring indications may be performed as noted.  ABI Findings: +---------+------------------+-----+----------+----------------+ Right    Rt Pressure (mmHg)IndexWaveform  Comment          +---------+------------------+-----+----------+----------------+ Brachial 105                    triphasic                  +---------+------------------+-----+----------+----------------+ PTA                             monophasicnon compressible +---------+------------------+-----+----------+----------------+ DP                              monophasicnon compressible +---------+------------------+-----+----------+----------------+ Burnetta Rhymes  0.23 Abnormal                   +---------+------------------+-----+----------+----------------+ +---------+------------------+-----+----------+----------------+ Left     Lt Pressure (mmHg)IndexWaveform  Comment          +---------+------------------+-----+----------+----------------+ Brachial 112                    triphasic                  +---------+------------------+-----+----------+----------------+ PTA      127               1.13 monophasic                 +---------+------------------+-----+----------+----------------+ DP                              monophasicnon compressible +---------+------------------+-----+----------+----------------+ Great Toe20                0.18 Abnormal                   +---------+------------------+-----+----------+----------------+ +-------+-----------+-----------+------------+------------+ ABI/TBIToday's ABIToday's TBIPrevious  ABIPrevious TBI +-------+-----------+-----------+------------+------------+ Right  [place]         0.23       [place]          0.75         +-------+-----------+-----------+------------+------------+ Left   [place]         0.18       [place]          0.57         +-------+-----------+-----------+------------+------------+ Arterial wall calcification precludes accurate ankle pressures and ABIs. Bilateral ABIs appear essentially unchanged. Bilateral TBIs appear decreased.  Summary: Right: Resting right ankle-brachial index indicates noncompressible right lower extremity arteries. The right toe-brachial index is abnormal.  Left: Resting left ankle-brachial index indicates noncompressible left lower extremity arteries. The left toe-brachial index is abnormal.  *See table(s) above for measurements and observations.  Electronically signed by Gaile New MD on 08/14/2024 at 5:42:38 PM.    Final         Scheduled Meds:  apixaban   2.5 mg Oral BID   Chlorhexidine  Gluconate Cloth  6 each Topical Daily   ezetimibe   10 mg Oral QPM   liver oil-zinc  oxide  1 Application Topical BID   thiamine   100 mg Oral Daily   Continuous Infusions:   ceFAZolin  (ANCEF ) IV Stopped (08/16/24 1100)     LOS: 3 days   Time spent: 54 minutes spent on chart review, discussion with nursing staff, consultants, updating family and interview/physical exam; more than 50% of that time was spent in counseling and/or coordination of care.     Camellia PARAS Carden Teel, DO [facility] Available via Epic secure chat 7am-7pm After these hours, please refer to coverage provider listed on [email or web address] 08/16/2024, 11:03 AM   "

## 2024-08-16 NOTE — Progress Notes (Signed)
 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  Patient placed on cefazolin . - I looked at his right lower extremity wound, no concern for active infection.  Image review of sacral wounds does show some erythema I suspect that is the source of bacteremia.   Recommendations:  -Repeat blood cultures to ensure clearance - Continue cefazolin  - TTE no vegetation, TEE planned for tomorrow - Continue wound care - Standard precautions   Microbiology:   Cefazolin  1/20-present   Cultures: Blood 1/19 1 out of 2 sets,2/4 bottles growing MSSA and staph epi 1/21 pending Urine   SUBJECTIVE: Sitting in bed.  Wife at bedside. Interval: Afebrile overnight.  WBC 5.5 K  Review of Systems: Review of Systems  All other systems reviewed and are negative.    Scheduled Meds:  apixaban   2.5 mg Oral BID   Chlorhexidine  Gluconate Cloth  6 each Topical Daily   ezetimibe   10 mg Oral QPM   liver oil-zinc  oxide  1 Application Topical BID   thiamine   100 mg Oral Daily   Continuous Infusions:   ceFAZolin  (ANCEF ) IV Stopped (08/16/24 1100)   PRN Meds:.acetaminophen  **OR** acetaminophen , albuterol , ondansetron  **OR** ondansetron  (ZOFRAN ) IV, mouth rinse, traZODone  Allergies[1]  OBJECTIVE: Vitals:   08/16/24 0900 08/16/24 1000 08/16/24 1100 08/16/24 1148  BP: (!) 147/47 (!) 151/58 (!) 114/46   Pulse:  95 89 97 93  Resp: (!) 22 (!) 25 20 (!) 21  Temp:    (!) 97.4 F (36.3 C)  TempSrc:    Oral  SpO2: 98% 100% (!) 87% 99%  Weight:      Height:       Body mass index is 37.97 kg/m.  Physical Exam Constitutional:      General: He is not in acute distress.    Appearance: He is normal weight. He is not toxic-appearing.  HENT:     Head: Normocephalic and atraumatic.     Right Ear: External ear normal.     Left Ear: External ear normal.     Nose: No congestion or rhinorrhea.     Mouth/Throat:     Mouth: Mucous membranes are moist.     Pharynx: Oropharynx is clear.  Eyes:     Extraocular Movements: Extraocular movements intact.     Conjunctiva/sclera: Conjunctivae normal.     Pupils: Pupils are equal, round, and reactive to light.  Cardiovascular:     Rate and Rhythm: Normal rate and regular rhythm.     Heart sounds: No murmur heard.    No friction rub. No gallop.  Pulmonary:     Effort: Pulmonary effort is normal.     Breath sounds: Normal breath sounds.  Abdominal:     General: Abdomen is flat. Bowel sounds are normal.     Palpations: Abdomen is soft.  Musculoskeletal:     Cervical back: Normal range of motion and neck  supple.  Skin:    General: Skin is warm and dry.  Neurological:     General: No focal deficit present.     Mental Status: He is oriented to person, place, and time.  Psychiatric:        Mood and Affect: Mood normal.       Lab Results Lab Results  Component Value Date   WBC 5.5 08/16/2024   HGB 9.4 (L) 08/16/2024   HCT 29.7 (L) 08/16/2024   MCV 112.5 (H) 08/16/2024   PLT 89 (L) 08/16/2024    Lab Results  Component Value Date   CREATININE 0.87 08/16/2024   BUN 22 08/16/2024   NA 142 08/16/2024   K 4.2 08/16/2024   CL 102 08/16/2024   CO2 35 (H) 08/16/2024    Lab Results  Component Value Date   ALT 13 08/16/2024   AST 65 (H) 08/16/2024   ALKPHOS 69 08/16/2024   BILITOT 0.3 08/16/2024        Loney Stank, MD [facility] 08/16/2024, 1:36 PM Evaluation of this patient requires complex antimicrobial therapy evaluation and counseling + isolation needs for disease transmission risk assessment and mitigation      [1] No Known Allergies  "

## 2024-08-16 NOTE — Progress Notes (Signed)
 Patient off pressors since 1/21. PCCM will sign off, please re-engage as needed.   Rexene LOISE Blush, PA-C [facility] 08/16/24 7:37 AM  Please see [email or web address] for pager details.  From 7A-7P if no response, please call [phone] After hours, please call ELink [phone]

## 2024-08-17 ENCOUNTER — Inpatient Hospital Stay (HOSPITAL_COMMUNITY)

## 2024-08-17 ENCOUNTER — Inpatient Hospital Stay (HOSPITAL_COMMUNITY): Admitting: Anesthesiology

## 2024-08-17 ENCOUNTER — Encounter (HOSPITAL_BASED_OUTPATIENT_CLINIC_OR_DEPARTMENT_OTHER): Admitting: General Surgery

## 2024-08-17 ENCOUNTER — Encounter (HOSPITAL_COMMUNITY): Admission: EM | Disposition: A | Payer: Self-pay | Source: Home / Self Care | Attending: Internal Medicine

## 2024-08-17 DIAGNOSIS — I361 Nonrheumatic tricuspid (valve) insufficiency: Secondary | ICD-10-CM | POA: Diagnosis not present

## 2024-08-17 DIAGNOSIS — I330 Acute and subacute infective endocarditis: Secondary | ICD-10-CM | POA: Diagnosis not present

## 2024-08-17 DIAGNOSIS — R7881 Bacteremia: Secondary | ICD-10-CM | POA: Diagnosis not present

## 2024-08-17 DIAGNOSIS — N179 Acute kidney failure, unspecified: Secondary | ICD-10-CM | POA: Diagnosis not present

## 2024-08-17 DIAGNOSIS — I2510 Atherosclerotic heart disease of native coronary artery without angina pectoris: Secondary | ICD-10-CM

## 2024-08-17 DIAGNOSIS — I5033 Acute on chronic diastolic (congestive) heart failure: Secondary | ICD-10-CM

## 2024-08-17 DIAGNOSIS — L8990 Pressure ulcer of unspecified site, unspecified stage: Secondary | ICD-10-CM | POA: Insufficient documentation

## 2024-08-17 DIAGNOSIS — I110 Hypertensive heart disease with heart failure: Secondary | ICD-10-CM

## 2024-08-17 DIAGNOSIS — A4102 Sepsis due to Methicillin resistant Staphylococcus aureus: Secondary | ICD-10-CM

## 2024-08-17 LAB — COMPREHENSIVE METABOLIC PANEL WITH GFR
ALT: <5 U/L (ref 0–44)
AST: 41 U/L (ref 15–41)
Albumin: 3 g/dL — ABNORMAL LOW (ref 3.5–5.0)
Alkaline Phosphatase: 64 U/L (ref 38–126)
Anion gap: 3 — ABNORMAL LOW (ref 5–15)
BUN: 10 mg/dL (ref 8–23)
CO2: 37 mmol/L — ABNORMAL HIGH (ref 22–32)
Calcium: 9 mg/dL (ref 8.9–10.3)
Chloride: 102 mmol/L (ref 98–111)
Creatinine, Ser: 0.59 mg/dL — ABNORMAL LOW (ref 0.61–1.24)
GFR, Estimated: >60 mL/min
Glucose, Bld: 79 mg/dL (ref 70–99)
Potassium: 4.2 mmol/L (ref 3.5–5.1)
Sodium: 142 mmol/L (ref 135–145)
Total Bilirubin: 0.4 mg/dL (ref 0.0–1.2)
Total Protein: 5.3 g/dL — ABNORMAL LOW (ref 6.5–8.1)

## 2024-08-17 LAB — ECHO TEE

## 2024-08-17 LAB — GLUCOSE, CAPILLARY
Glucose-Capillary: 83 mg/dL (ref 70–99)
Glucose-Capillary: 88 mg/dL (ref 70–99)

## 2024-08-17 MED ORDER — SODIUM CHLORIDE 0.9 % IV SOLN: INTRAVENOUS | Status: DC

## 2024-08-17 MED ORDER — SODIUM CHLORIDE 0.9 % IV SOLN: INTRAVENOUS | Status: DC | PRN

## 2024-08-17 MED ORDER — PROPOFOL 10 MG/ML IV BOLUS
INTRAVENOUS | Status: DC | PRN
  Administered 2024-08-17: 40 mg via INTRAVENOUS

## 2024-08-17 MED ORDER — PROPOFOL 500 MG/50ML IV EMUL
INTRAVENOUS | Status: DC | PRN
  Administered 2024-08-17: 50 ug/kg/min via INTRAVENOUS

## 2024-08-17 NOTE — Progress Notes (Signed)
 [facility]  PROGRESS NOTE  [name] FMW:[id number] DOB: 1951/04/03 DOA: 08/13/2024 PCP: Candise Aleene DEL, MD   Brief HPI:    74 y.o. adult with past medical history significant for inclusion body myositis, HTN, history of DVT on Eliquis , chronic diastolic congestive heart failure, chronic hypoxic respite failure (2-4L Outlook baseline), venous insufficiency with chronic bilateral lower extremity edema, right lower extremity wounds (follows with wound care clinc), chronic hypoxic/hypercarbic respiratory failure, OSA, CAD, HLD, obesity who presented to [facility] ED on 08/13/2024 from home via EMS due to fall, apparently slid out of his bed, increased weakness.  Patient denied loss of consciousness, did not strike his head.  Recently discharged from SNF a few days prior.  Reports continue to take his prescribed occasions but has been taking a few extra doses of his torsemide  over the last few days to attempt to improve his lower extremity edema.   In the ED, temperature 97.5 F, HR 71, RR 70/35, SpO2 73% on room air.  WBC 9.3, hemoglobin 10.9, platelet count 100.  Sodium 138, potassium 4.8, chloride 92, CO2 34, glucose 96, BUN 49, creat 3.14.  AST 82, ALT 20, total bilirubin 0.6.  BNP 713.0.  High-sensitivity troponin 306 followed by 261.  Lactic acid 2.2 followed by 1.6.  Chest x-ray with persistent low lung volumes with chronic elevation of the right hemidiaphragm and increased right basilar atelectasis.  Blood cultures x 2 obtained.  EKG with sinus rhythm, rate 60, QTc 501, no concerning ST elevation/depressions or T wave inversions.  Patient received 1.5 L LR bolus in the ED.  [facility] consulted for admission for further evaluation and management of fall, weakness, hypotension.   Significant Hospital events: 1/19: Admit [facility], received 3.5 L IVF, 12.5 g albumin  x 2, started on midodrine  1/20: Transferred to ICU, PCCM consulted, discontinue midodrine  due to reflex bradycardia, started on  norepinephrine , started on cefazolin  for positive blood culture with MSSA, staph epi.  Infectious disease consulted 1/21: Norepinephrine  titrated off 1/22: Remains off of vasopressors, transfer to telemetry, PCCM signed off, TEE planned for 1/23   Assessment/Plan:   Shock, hypovolemic vs septic MSSA bacteremia Staphylococcus epidermidis bacteremia Patient presenting after fall from home, apparently slid out of his bed.   Recently discharged from SNF a few days prior.  Recently admitted for septic shock secondary to lower extremity cellulitis/wounds.    Lactic acid elevated 2.2.  Blood pressure 70/35.   Apparently has been taking additional doses of his oral diuretic to help with his lower extremity edema over the last few days.  Suspect etiology likely secondary to hypovolemia versus cardiogenic.  Patient received 3.5 L of aggressive IV fluid resuscitation, 25 g of IV albumin  and started on midodrine  without significant improvement of blood pressure.  TTE with LVEF 7975%, LV with no regional wall motion normalities, LV diastolic parameters indeterminate, LA mildly dilated, RA moderately dilated, mild dilation ascending aorta measuring 41 mm, IVC dilated in size no vegetations noted. 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  Was 0.64 on 07/20/2024.   Likely secondary to prerenal azotemia from excessive diuretic use versus ATN from hypotension.  Received aggressive IV fluid resuscitation and IV albumin  on admission. -- Cr 3.14>2.67>1.72>0.87 -- Holding home lisinopril  and atenolol  -- Foley placed to monitor urine  output, 1300 mL past 24 hours -- Continue Foley catheter, can consider voiding trial in 1-2 days -- Strict I's and O's   Elevated LFTs AST elevated 82, ALT 20.  Unclear etiology, suspect mild shock liver due to hypotension as above. -- AST 82>86>65 -- ALT 20>21>13 -- Tbili 0.6>0.3>0.3 -- Holding home statin    Hypertension Chronic diastolic congestive heart failure, compensated -- Hold home atenolol , torsemide  and lisinopril  -- Strict I's and O's and daily weights   Peripheral vascular disease Venous insufficiency Right lower extremity wounds Sacral wound Patient follows with wound clinic outpatient, Dr. Marolyn Wound 07/10/24 Pressure Injury Buttocks Right;Left Deep Tissue Pressure Injury - Purple or maroon localized area of discolored intact skin or blood-filled blister due to damage of underlying soft tissue from pressure and/or shear. (Active)     Wound 08/13/24 1840 Pressure Injury Coccyx Mid Stage 2 -  Partial thickness loss of dermis presenting as a shallow open injury with a red, pink wound bed without slough. (Active)     Wound 08/13/24 1840 Pressure Injury Buttocks Right;Left;Medial Deep Tissue Pressure Injury - Purple or maroon localized area of discolored intact skin or blood-filled blister due to damage of underlying soft tissue from pressure and/or shear. (Active)     Wound 08/13/24 1841 Pressure Injury Ischial tuberosity Right;Left Deep Tissue Pressure Injury - Purple or maroon localized area of discolored intact skin or blood-filled blister due to damage of underlying soft tissue from pressure and/or shear. (Active)  -- Wound RN consulted -- ABI 1/20 abnormal with noncompressible right and left lower extremity arteries. 1. Cleanse sacrum/coccyx with NS, apply a small piece of (Aquacel AG) silver hydrofiber to coccyx and secure with silicone foam. 2. Cleanse B ischium/posterior thighs with NS, apply Xeroform gauze to wounds daily and secure with silicone foam. 3. If  patient is still in the hospital on Wed, 1/28, please change right leg dressing as follows: Cut piece of Aquacel (#[id number]) and apply to right posterior leg, then cover with foam dressing.  Moisten with NS each time to remove previous dressing.  Apply kerlex, beginning just behind toes to below knee, then ace wrap in the same manner.      -- Would benefit from vascular surgery follow-up outpatient   History of DVT -- Eliquis  2.5 m p.o. twice daily   CAD HLD -- Zetia  10 mg p.o. daily -- Hold home Crestor  due to elevated LFTs   OSA Patient was to be on BiPAP after discharge from SNF.  Family working on obtaining device.  Has been off since. 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  PaCO2 confirmed at 54 on ABG. Alarms and internal battery are required to ensure continuous support and immediate recognition of ventilatory insufficiency. A RAD with backup rate is insufficient due to the severity of illness. Volume ventilation is needed to reduce work of breathing and fatigue and to prevent life threatening exacerbations. OSA is not the predominant cause of hypercapnia.   BPH -- Holding tamsulosin  due to hypotension as above   Obesity, class II Body mass index is 37.97 kg/m.        DVT prophylaxis: Eliquis   Medications     apixaban   2.5 mg Oral BID  Chlorhexidine  Gluconate Cloth  6 each Topical Daily   ezetimibe   10 mg Oral QPM   liver oil-zinc  oxide  1 Application Topical BID   thiamine   100 mg Oral Daily     Data Reviewed:   CBG:  Recent Labs  Lab 08/16/24 0812 08/16/24 1231 08/16/24 1608 08/16/24 2050 08/17/24 0748  GLUCAP 73 114* 111* 86 83    SpO2: 99 % O2 Flow Rate (L/min): 3 L/min FiO2 (%): 40 %    Vitals:   08/16/24 1500 08/16/24 1606 08/16/24 2048 08/17/24 0155  BP: (!) 122/48 (!) 147/60 (!) 144/73 (!)  151/71  Pulse: 99 97 100 (!) 109  Resp: (!) 21 (!) 22 18 18   Temp:  98.4 F (36.9 C) 98.1 F (36.7 C) 98.2 F (36.8 C)  TempSrc:  Oral Oral Oral  SpO2: 100% 99% 99% 99%  Weight:      Height:          Data Reviewed:  Basic Metabolic Panel: Recent Labs  Lab 08/13/24 1234 08/14/24 0332 08/14/24 0352 08/14/24 1025 08/14/24 1531 08/15/24 0331 08/16/24 0350  NA 138  --  138 137 140 141 142  K 4.8  --  4.3 4.1 4.7 4.2 4.2  CL 92*  --  96*  --  99 100 102  CO2 34*  --  31  --  33* 34* 35*  GLUCOSE 96  --  79  --  154* 106* 92  BUN 49*  --  47*  --  45* 39* 22  CREATININE 3.14*  --  2.67*  --  2.35* 1.72* 0.87  CALCIUM  9.2  --  8.4*  --  8.4* 8.4* 8.7*  MG  --  2.0  --   --   --  2.0  --   PHOS  --   --   --   --  4.9* 3.3  --     CBC: Recent Labs  Lab 08/13/24 1234 08/14/24 0352 08/14/24 1025 08/15/24 0331 08/16/24 0350  WBC 9.3 7.9  --  7.9 5.5  HGB 10.9* 8.9* 9.2* 9.2* 9.4*  HCT 35.0* 28.0* 27.0* 28.5* 29.7*  MCV 111.1* 109.8*  --  111.3* 112.5*  PLT 100* 74*  --  105* 89*    LFT Recent Labs  Lab 08/13/24 1234 08/14/24 1531 08/15/24 0331 08/16/24 0350  AST 82*  --  86* 65*  ALT 20  --  21 13  ALKPHOS 92  --  70 69  BILITOT 0.6  --  0.3 0.3  PROT 6.3*  --  5.2* 5.4*  ALBUMIN  3.6 3.2* 3.0* 3.0*     Antibiotics: Anti-infectives (From admission, onward)    Start     Dose/Rate Route Frequency Ordered Stop   08/14/24 1800  ceFAZolin  (ANCEF ) IVPB 2g/100 mL premix        2 g 200 mL/hr over 30 Minutes Intravenous Every 8 hours 08/14/24 1703          CONSULTS infectious disease  Code Status: Full code  Family Communication: No family at bedside     Subjective   Patient seen and examined, plan to go to [name] for TEE.   Objective    Physical Examination:   General-appears in no acute distress Heart-S1-S2, regular, no murmur auscultated Lungs-clear to auscultation bilaterally, no wheezing or crackles  auscultated Abdomen-soft, nontender, no organomegaly Extremities-no edema in the lower extremities Neuro-alert, oriented x3, no focal deficit noted     Wound 07/10/24 Pressure Injury Buttocks  Right;Left Deep Tissue Pressure Injury - Purple or maroon localized area of discolored intact skin or blood-filled blister due to damage of underlying soft tissue from pressure and/or shear. (Active)     Wound 08/13/24 1840 Pressure Injury Coccyx Mid Stage 2 -  Partial thickness loss of dermis presenting as a shallow open injury with a red, pink wound bed without slough. (Active)     Wound 08/13/24 1840 Pressure Injury Buttocks Right;Left;Medial Deep Tissue Pressure Injury - Purple or maroon localized area of discolored intact skin or blood-filled blister due to damage of underlying soft tissue from pressure and/or shear. (Active)     Wound 08/13/24 1841 Pressure Injury Ischial tuberosity Right;Left Deep Tissue Pressure Injury - Purple or maroon localized area of discolored intact skin or blood-filled blister due to damage of underlying soft tissue from pressure and/or shear. (Active)        Sabas GORMAN [name]   [facility] If 7PM-7AM, please contact night-coverage at [email or web address], Office  [phone]   08/17/2024, 8:34 AM  LOS: 4 days

## 2024-08-17 NOTE — Plan of Care (Signed)

## 2024-08-17 NOTE — Progress Notes (Signed)
 Informed wife, Marshall, that patient left personal belongings here and they are bagged up at the secretaries desk to include: hearing aides, laptop, headphones., cell phone,Charging cable, clothes, and shoes

## 2024-08-17 NOTE — Interval H&P Note (Signed)
 History and Physical Interval Note:  08/17/2024 11:53 AM  [name]  has presented today for surgery, with the diagnosis of bacteremia.  The various methods of treatment have been discussed with the patient and family. After consideration of risks, benefits and other options for treatment, the patient has consented to  Procedures: TRANSESOPHAGEAL ECHOCARDIOGRAM (N/A) as a surgical intervention.  The patient's history has been reviewed, patient examined, no change in status, stable for surgery.  I have reviewed the patient's chart and labs.  Questions were answered to the patient's satisfaction.     Oneka Parada

## 2024-08-17 NOTE — Plan of Care (Signed)
  Problem: Clinical Measurements: Goal: Diagnostic test results will improve Outcome: Progressing Goal: Respiratory complications will improve Outcome: Progressing Goal: Cardiovascular complication will be avoided Outcome: Progressing   Problem: Nutrition: Goal: Adequate nutrition will be maintained Outcome: Progressing   Problem: Pain Managment: Goal: General experience of comfort will improve and/or be controlled Outcome: Progressing

## 2024-08-17 NOTE — Transfer of Care (Signed)
 Immediate Anesthesia Transfer of Care Note  Patient: [name]  Procedure(s) Performed: TRANSESOPHAGEAL ECHOCARDIOGRAM  Patient Location: Cath Lab  Anesthesia Type:MAC  Level of Consciousness: awake, alert , and oriented  Airway & Oxygen Therapy: Patient Spontanous Breathing and Patient connected to nasal cannula oxygen  Post-op Assessment: Report given to RN and Post -op Vital signs reviewed and stable  Post vital signs: Reviewed and stable  Last Vitals:  Vitals Value Taken Time  BP 98/70 1227  Temp 36 1227  Pulse 85 1227  Resp 24 1227  SpO2 100 1227    Last Pain:  Vitals:   08/17/24 1133  TempSrc: Temporal  PainSc: 0-No pain      Patients Stated Pain Goal: 2 (08/15/24 0859)  Complications: There were no known notable events for this encounter.

## 2024-08-17 NOTE — Progress Notes (Signed)
 Patient arrived for TEE. During pre-procedural assessment, patient stated that he didn't want to go back to [facility]. Further assessed, he stated he would tell me after we got him a bed her at [facility]. He did not offer details, he did not want to return to [facility]. Dr. Francyne informed.

## 2024-08-17 NOTE — Anesthesia Preprocedure Evaluation (Signed)
"                                    Anesthesia Evaluation  Patient identified by MRN, date of birth, ID band Patient awake    Reviewed: Allergy & Precautions, H&P , NPO status , Patient's Chart, lab work & pertinent test results  Airway Mallampati: II   Neck ROM: full    Dental   Pulmonary shortness of breath, former smoker   breath sounds clear to auscultation       Cardiovascular hypertension, + CAD, + Cardiac Stents and +CHF  + dysrhythmias Atrial Fibrillation  Rhythm:irregular Rate:Normal     Neuro/Psych    GI/Hepatic   Endo/Other    Renal/GU      Musculoskeletal  (+) Arthritis ,    Abdominal   Peds  Hematology  (+) Blood dyscrasia, anemia   Anesthesia Other Findings   Reproductive/Obstetrics                              Anesthesia Physical Anesthesia Plan  ASA: 3  Anesthesia Plan: MAC   Post-op Pain Management:    Induction: Intravenous  PONV Risk Score and Plan: 1 and Propofol  infusion and Treatment may vary due to age or medical condition  Airway Management Planned: Nasal Cannula  Additional Equipment:   Intra-op Plan:   Post-operative Plan:   Informed Consent: I have reviewed the patients History and Physical, chart, labs and discussed the procedure including the risks, benefits and alternatives for the proposed anesthesia with the patient or authorized representative who has indicated his/her understanding and acceptance.     Dental advisory given  Plan Discussed with: CRNA, Anesthesiologist and Surgeon  Anesthesia Plan Comments:         Anesthesia Quick Evaluation  "

## 2024-08-17 NOTE — Progress Notes (Signed)
 BiPAP was applied by respiratory per order. Patient tolerated for approximately an hour, then repeatedly removed the mask and refused to keep BiPAP on despite multiple attempts by this RN. Patient A&O x2. Education repeatedly attempted but ineffective. Provider made aware. Care continues.

## 2024-08-17 NOTE — Progress Notes (Signed)
 OT Cancellation Note  Patient Details Name: Jams Trickett MRN: [id number] DOB: [birth year]   Cancelled Treatment:    Reason Eval/Treat Not Completed: Other (comment). The pt is off the unit for a test/procedure.   Delanna JINNY Lesches, OTR/L 08/17/2024, 4:43 PM

## 2024-08-17 NOTE — Progress Notes (Signed)
 [facility] night cross cover note:   This patient has been NPO since midnight heading into 08/17/2024 in preparation for TEE, which has now been completed.  I subsequently placed order for regular diet for this patient.    Eva Pore, DO Hospitalist
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
## 2024-08-17 NOTE — Op Note (Signed)
 INDICATIONS: Sepsis, Staph bacteremia. 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 The risks, benefits and alternatives for the procedure were discussed and the patient comprehended these risks.  Risks include, but are not limited to, cough, sore throat, vomiting, nausea, somnolence, esophageal and stomach trauma or perforation, bleeding, low blood pressure, aspiration, pneumonia, infection, trauma to the teeth and death.    During this procedure the patient was administered IV propofol  by Anesthesiology  The transesophageal probe was inserted in the esophagus and stomach without difficulty and multiple views were obtained.  The patient was kept under observation until the patient left the procedure room.  The patient left the procedure room in stable condition.   Agitated microbubble saline contrast was administered.  COMPLICATIONS:    There were no immediate complications.  FINDINGS:  No vegetations seen Normal LV systolic function No intracardiac shunts Mild aortic atherosclerosis  RECOMMENDATIONS:     No TEE evidence of endocarditis  Time Spent Directly with the Patient:  45 minutes   Javoris Star 08/17/2024, 12:20 PM

## 2024-08-18 ENCOUNTER — Inpatient Hospital Stay (HOSPITAL_COMMUNITY)

## 2024-08-18 DIAGNOSIS — L89150 Pressure ulcer of sacral region, unstageable: Secondary | ICD-10-CM | POA: Diagnosis not present

## 2024-08-18 DIAGNOSIS — R7881 Bacteremia: Secondary | ICD-10-CM | POA: Diagnosis not present

## 2024-08-18 DIAGNOSIS — N179 Acute kidney failure, unspecified: Secondary | ICD-10-CM | POA: Diagnosis not present

## 2024-08-18 LAB — GLUCOSE, CAPILLARY
Glucose-Capillary: 65 mg/dL — ABNORMAL LOW (ref 70–99)
Glucose-Capillary: 95 mg/dL (ref 70–99)
Glucose-Capillary: 72 mg/dL (ref 70–99)
Glucose-Capillary: 68 mg/dL — ABNORMAL LOW (ref 70–99)

## 2024-08-18 LAB — CULTURE, BLOOD (ROUTINE X 2)
Culture: NO GROWTH
Special Requests: ADEQUATE

## 2024-08-18 MED ORDER — FUROSEMIDE 10 MG/ML IJ SOLN
20.0000 mg | Freq: Once | INTRAMUSCULAR | Status: AC
  Administered 2024-08-18: 20 mg via INTRAVENOUS
  Filled 2024-08-18: qty 2

## 2024-08-18 MED ORDER — TAMSULOSIN HCL 0.4 MG PO CAPS
0.4000 mg | ORAL_CAPSULE | Freq: Every day | ORAL | Status: DC
  Administered 2024-08-18 – 2024-08-22 (×5): 0.4 mg via ORAL
  Filled 2024-08-18 (×5): qty 1

## 2024-08-18 NOTE — Progress Notes (Signed)
" °   08/18/24 0027  BiPAP/CPAP/SIPAP  BiPAP/CPAP/SIPAP Pt Type Adult  BiPAP/CPAP/SIPAP DREAMSTATIOND  Mask Type Full face mask  Mask Size Large  Respiratory Rate 18 breaths/min  IPAP 14 cmH20  EPAP 8 cmH2O  FiO2 (%) 96 %  Flow Rate 4 lpm (lpm)  Patient Home Machine No  Patient Home Mask No  Patient Home Tubing No  Auto Titrate No  Device Plugged into RED Power Outlet Yes  BiPAP/CPAP /SiPAP Vitals  Pulse Rate 90  Resp 18  SpO2 94 %  Bilateral Breath Sounds Clear;Diminished  MEWS Score/Color  MEWS Score 0  MEWS Score Color Green   Patient placed on BiPap for HS. Patient is familiar with equipment and procedure.  "

## 2024-08-18 NOTE — Progress Notes (Signed)
 Hypoglycemic Event  CBG: 65  Treatment: 4 oz juice/soda  Symptoms: None  Follow-up CBG: Time:0701 CBG Result:95  Possible Reasons for Event: Inadequate meal intake   Gail Corean SAILOR

## 2024-08-18 NOTE — Progress Notes (Addendum)
 [facility]  PROGRESS NOTE  [name] FMW:[id number] DOB: [birth year] DOA: 08/13/2024 PCP: [name] Aleene DEL, MD   Brief HPI:    74 y.o. adult with past medical history significant for inclusion body myositis, HTN, history of DVT on Eliquis , chronic diastolic congestive heart failure, chronic hypoxic respite failure (2-4L [name] baseline), venous insufficiency with chronic bilateral lower extremity edema, right lower extremity wounds (follows with [name] care clinc), chronic hypoxic/hypercarbic respiratory failure, OSA, CAD, HLD, obesity who presented to [facility] ED on 08/13/2024 from home via EMS due to fall, apparently slid out of his bed, increased weakness.  Patient denied loss of consciousness, did not strike his head.  Recently discharged from SNF a few days prior.  Reports continue to take his prescribed occasions but has been taking a few extra doses of his torsemide  over the last few days to attempt to improve his lower extremity edema.   Significant Hospital events: 1/19: Admit [facility], received 3.5 L IVF, 12.5 g albumin  x 2, started on midodrine  1/20: Transferred to ICU, PCCM consulted, discontinue midodrine  due to reflex bradycardia, started on norepinephrine , started on cefazolin  for positive blood culture with MSSA, staph epi.  Infectious disease consulted 1/21: Norepinephrine  titrated off 1/22: Remains off of vasopressors, transfer to telemetry, PCCM signed off 1/23- TEE   Assessment/Plan:   Shock, hypovolemic vs septic MSSA bacteremia Staphylococcus epidermidis bacteremia Patient presenting after fall from home, apparently slid out of his bed.   Recently discharged from SNF a few days prior.  Recently admitted for septic shock secondary to lower extremity cellulitis/wounds.    Lactic acid elevated 2.2.  Blood pressure 70/35.   Apparently has been taking additional doses of his oral diuretic to help with his lower extremity edema over the last few days.  Suspect etiology  likely secondary to hypovolemia versus cardiogenic.  Patient received 3.5 L of aggressive IV fluid resuscitation, 25 g of IV albumin  and started on midodrine  without significant improvement of blood pressure.  TTE with LVEF 7975%, LV with no regional wall motion normalities, LV diastolic parameters indeterminate, LA mildly dilated, RA moderately dilated, mild dilation ascending aorta measuring 41 mm, IVC dilated in size no vegetations noted. 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  Was 0.64 on 07/20/2024.   Likely secondary to prerenal azotemia from excessive diuretic use versus ATN from hypotension.   Received aggressive IV fluid resuscitation and IV albumin  on admission. -- Cr 3.14>2.67>1.72>0.87 -- Holding home lisinopril  and atenolol  -- Foley placed to monitor urine output, 1300 mL past 24 hours -- Continue Foley catheter, can consider voiding trial in 1-2 days -- Strict I's and O's -Will start Flomax  as it has not yet been started   Elevated LFTs  Unclear etiology, suspect mild shock liver due to hypotension as above. -- Holding home statin  Delirium - Appears improved from prior - Delirium precautions     Peripheral vascular disease Venous insufficiency Right lower extremity wounds Sacral [name] Patient follows with [name] clinic outpatient, Dr. Marolyn [name] 07/10/24 Pressure Injury Buttocks Right;Left Deep Tissue Pressure Injury - Purple or maroon localized area of discolored intact skin or blood-filled blister due  to damage of underlying soft tissue from pressure and/or shear. (Active)     [name]  08/13/24 1840 Pressure Injury Coccyx Mid Stage 2 -  Partial thickness loss of dermis presenting as a shallow open injury with a red, pink [name] bed without slough. (Active)     [name] 08/13/24 1840 Pressure Injury Buttocks Right;Left;Medial Deep Tissue Pressure Injury - Purple or maroon localized area of discolored intact skin or blood-filled blister due to damage of underlying soft tissue from pressure and/or shear. (Active)     [name] 08/13/24 1841 Pressure Injury Ischial tuberosity Right;Left Deep Tissue Pressure Injury - Purple or maroon localized area of discolored intact skin or blood-filled blister due to damage of underlying soft tissue from pressure and/or shear. (Active)  -- Wound RN consulted -- ABI 1/20 abnormal with noncompressible right and left lower extremity arteries. 1. Cleanse sacrum/coccyx with NS, apply a small piece of (Aquacel AG) silver hydrofiber to coccyx and secure with silicone foam. 2. Cleanse B ischium/posterior thighs with NS, apply Xeroform gauze to wounds daily and secure with silicone foam. 3. If patient is still in the hospital on Wed, 1/28, please change right leg dressing as follows: Cut piece of Aquacel (#[id number]) and apply to right posterior leg, then cover with foam dressing.  Moisten with NS each time to remove previous dressing.  Apply kerlex, beginning just behind toes to below knee, then ace wrap in the same manner.   -- Would benefit from vascular surgery follow-up outpatient   History of DVT -- Eliquis  2.5 m p.o. twice daily   CAD HLD -- Zetia  10 mg p.o. daily -- Hold home Crestor  due to elevated LFTs   OSA -- BiPAP -- TOC consulted for home DME NIV Mr Manthey requires volume targeted ventilation via NIV/HMV for nocturnal and daytime use for CRF secondary to Obesity Hypoventilation Syndrome.  PaCO2 confirmed at 54 on ABG. Alarms and internal battery are required to ensure continuous support and immediate recognition of ventilatory insufficiency. A RAD  with backup rate is insufficient due to the severity of illness. Volume ventilation is needed to reduce work of breathing and fatigue and to prevent life threatening exacerbations. OSA is not the predominant cause of hypercapnia.   BPH -- Flomax    Obesity, class II Estimated body mass index is 38.56 kg/m as calculated from the following:   Height as of this encounter: 5' 11 (1.803 m).   Weight as of this encounter: 125.4 kg.         DVT prophylaxis: Eliquis   Medications     apixaban   2.5 mg Oral BID   Chlorhexidine  Gluconate Cloth  6 each Topical Daily   ezetimibe   10 mg Oral QPM   liver oil-zinc  oxide  1 Application Topical BID   thiamine   100 mg Oral Daily     Data Reviewed:   CBG:  Recent Labs  Lab 08/17/24 0748 08/17/24 2058 08/18/24 0553 08/18/24 0701 08/18/24 1155  GLUCAP 83 88 65* 95 72    SpO2: 97 % O2 Flow Rate (L/min): 4 L/min FiO2 (%): 96 %    Vitals:   08/18/24 0027 08/18/24 0405 08/18/24 0514 08/18/24 1157  BP:  (!) 112/42  (!) 134/90  Pulse: 90 91 91 96  Resp: 18 20 (!) 23 20  Temp:  97.7 F (36.5 C)  98.1 F (36.7 C)  TempSrc:  Oral  Oral  SpO2: 94% 91% 92% 97%  Weight:   125.4 kg   Height:  Data Reviewed:  Basic Metabolic Panel: Recent Labs  Lab 08/14/24 0332 08/14/24 0352 08/14/24 1025 08/14/24 1531 08/15/24 0331 08/16/24 0350 08/17/24 0830  NA  --  138 137 140 141 142 142  K  --  4.3 4.1 4.7 4.2 4.2 4.2  CL  --  96*  --  99 100 102 102  CO2  --  31  --  33* 34* 35* 37*  GLUCOSE  --  79  --  154* 106* 92 79  BUN  --  47*  --  45* 39* 22 10  CREATININE  --  2.67*  --  2.35* 1.72* 0.87 0.59*  CALCIUM   --  8.4*  --  8.4* 8.4* 8.7* 9.0  MG 2.0  --   --   --  2.0  --   --   PHOS  --   --   --  4.9* 3.3  --   --     CBC: Recent Labs  Lab 08/13/24 1234 08/14/24 0352 08/14/24 1025 08/15/24 0331 08/16/24 0350  WBC 9.3 7.9  --  7.9 5.5  HGB 10.9* 8.9* 9.2* 9.2* 9.4*  HCT 35.0* 28.0* 27.0* 28.5* 29.7*   MCV 111.1* 109.8*  --  111.3* 112.5*  PLT 100* 74*  --  105* 89*    LFT Recent Labs  Lab 08/13/24 1234 08/14/24 1531 08/15/24 0331 08/16/24 0350 08/17/24 0830  AST 82*  --  86* 65* 41  ALT 20  --  21 13 <5  ALKPHOS 92  --  70 69 64  BILITOT 0.6  --  0.3 0.3 0.4  PROT 6.3*  --  5.2* 5.4* 5.3*  ALBUMIN  3.6 3.2* 3.0* 3.0* 3.0*     Antibiotics: Anti-infectives (From admission, onward)    Start     Dose/Rate Route Frequency Ordered Stop   08/14/24 1800  ceFAZolin  (ANCEF ) IVPB 2g/100 mL premix        2 g 200 mL/hr over 30 Minutes Intravenous Every 8 hours 08/14/24 1703          CONSULTS infectious disease Cardiology (TEE)  Code Status: Full code  Family Communication: No family at bedside     Subjective   Patient refused to return to [place] long yesterday so was kept at [employer]   Objective    Physical Examination:    General: Appearance:    Obese adult in no acute distress     Lungs:    On  bipap-- coarse air sounds  Heart:    Normal heart rate. Normal rhythm. No murmurs, rubs, or gallops.   MS:   All extremities are intact.   Neurologic:   Awake, alert     Harlene RAYMOND Bowl   [facility] If 7PM-7AM, please contact night-coverage at [email or web address], Office  [phone]   08/18/2024, 12:11 PM  LOS: 5 days

## 2024-08-18 NOTE — Evaluation (Signed)
 Occupational Therapy Evaluation Patient Details Name: [name] MRN: [id number] DOB: 1951-02-15 Today's Date: 08/18/2024   History of Present Illness   74 yo M Presents to ED 08/13/24 after slid out of bed, DC from rehab 2 days prior, mostly bedbound. Patient hypotensive, chronic hypoxia and hypercarbic respiratory failure.    PMHx: CAD, HTN, HLD, inclusion body myositis, DVT, chonic LB edema, paralyzed Rt hemidiaphragm     Clinical Impressions Pt admitted based on above, and was seen based on problem list below. Prior to December admission, pt was mod I from w/c level with ADLs. Since admission, pt has experienced a significant decline, reporting assist for all ADLs and transfers. Today pt is requiring set up  to total assist for ADLs. Bed mobility and functional transfers are  min +2 assist with use of stedy. Pt anxious regarding O2 sats, and becomes impulsive, throwing his limbs around reporting  I can't breathe, despite O2 sats being stable. Pt  began hyperventilating, causing O2 sats to decrease to 82%, RN present and placed pt on CPAP, at pt's request. Based on pt's performance recommending <3 hours of skilled rehab daily. If pt and family decline, then home with 24/7 assistance and HHOT. OT will continue to follow acutely to maximize functional independence.     If plan is discharge home, recommend the following:   Two people to help with walking and/or transfers;Two people to help with bathing/dressing/bathroom;Assistance with cooking/housework;Assist for transportation     Functional Status Assessment   Patient has had a recent decline in their functional status and demonstrates the ability to make significant improvements in function in a reasonable and predictable amount of time.     Equipment Recommendations   BSC/3in1 (bariatric)      Precautions/Restrictions   Precautions Precautions: Fall Recall of Precautions/Restrictions: Intact Precaution/Restrictions  Comments: monitor sats/HR Restrictions Weight Bearing Restrictions Per Provider Order: No     Mobility Bed Mobility Overal bed mobility: Needs Assistance Bed Mobility: Supine to Sit, Sit to Supine     Supine to sit: Min assist, +2 for physical assistance, HOB elevated, Used rails Sit to supine: Mod assist, HOB elevated, Used rails   General bed mobility comments: Min assist for trunk to come to supine, mod assist to return BLEs to bed. cues for sequencing    Transfers Overall transfer level: Needs assistance Equipment used: Ambulation equipment used Transfers: Sit to/from Stand Sit to Stand: Min assist, +2 physical assistance, Via lift equipment           General transfer comment: Cues for hand placement. Min +2 to boost to stand. Pt with preference for flexed trunk posture, despite cues for extension Transfer via Lift Equipment: Stedy    Balance Overall balance assessment: Needs assistance, History of Falls Sitting-balance support: Bilateral upper extremity supported, Feet supported Sitting balance-Leahy Scale: Poor     Standing balance support: During functional activity, Reliant on assistive device for balance, Bilateral upper extremity supported Standing balance-Leahy Scale: Poor Standing balance comment: reliant on external support     ADL either performed or assessed with clinical judgement   ADL Overall ADL's : Needs assistance/impaired Eating/Feeding: Set up;Sitting   Grooming: Set up;Sitting   Upper Body Bathing: Minimal assistance;Sitting Upper Body Bathing Details (indicate cue type and reason): assist for back Lower Body Bathing: Moderate assistance;Sit to/from stand   Upper Body Dressing : Set up;Sitting   Lower Body Dressing: Moderate assistance;Sit to/from Market Researcher Details (indicate cue type and reason): Use  of stedy, min +2 to stand Toileting- Architect and Hygiene: Total assistance;+2 for physical assistance;+2  for safety/equipment;Sit to/from stand         General ADL Comments: Decreased strength and activity tolerance     Vision Baseline Vision/History: 1 Wears glasses Patient Visual Report: No change from baseline Vision Assessment?: No apparent visual deficits            Pertinent Vitals/Pain Pain Assessment Pain Assessment: Faces Faces Pain Scale: Hurts little more Pain Location: R UE Pain Descriptors / Indicators: Aching, Discomfort, Guarding Pain Intervention(s): Limited activity within patient's tolerance     Extremity/Trunk Assessment Upper Extremity Assessment Upper Extremity Assessment: Generalized weakness;RUE deficits/detail RUE Deficits / Details: Noted RUE edema, pt treating hand as flaccid at times, however uses functionally RUE Coordination: decreased fine motor   Lower Extremity Assessment Lower Extremity Assessment: Defer to PT evaluation       Communication Communication Communication: Impaired Factors Affecting Communication: Hearing impaired   Cognition Arousal: Alert Behavior During Therapy: Anxious, WFL for tasks assessed/performed Cognition: No family/caregiver present to determine baseline             OT - Cognition Comments: Anxious with mobility and O2 needs       Following commands: Intact       Cueing  General Comments   Cueing Techniques: Verbal cues;Tactile cues;Visual cues  After returning pt to bed, pt became anxious and throwing his body around stating I cant breathe despite O2 being at 89% on 4L O2 and went as low as 82% due to pt becoming increasingly anxious. RN present and placed pt on CPAP at pt's request           Home Living Family/patient expects to be discharged to:: Private residence Living Arrangements: Spouse/significant other;Children Available Help at Discharge: Family;Available 24 hours/day Type of Home: House Home Access: Level entry     Home Layout: One level     Bathroom Shower/Tub: Retail Banker: Standard     Home Equipment: Agricultural Consultant (2 wheels);Cane - single point;Wheelchair - power;Grab bars - tub/shower;Shower seat;Grab bars - toilet   Additional Comments: Pt/spouse live in basement with ground level entry. Spouse uses WC for mobility, reports has 3 power chairs      Prior Functioning/Environment Prior Level of Function : Needs assist       Mobility Comments: Prior to [facility] admission, pt was mod I at w/c level, since has required assistance for transfers ADLs Comments: Prior to December admission pt was mod I at w/c level, since has required assistance for ADLs    OT Problem List: Decreased strength;Decreased range of motion;Decreased activity tolerance;Impaired balance (sitting and/or standing);Decreased cognition;Decreased safety awareness;Decreased knowledge of precautions;Cardiopulmonary status limiting activity   OT Treatment/Interventions: Self-care/ADL training;Therapeutic exercise;Energy conservation;DME and/or AE instruction;Therapeutic activities;Patient/family education;Balance training      OT Goals(Current goals can be found in the care plan section)   Acute Rehab OT Goals Patient Stated Goal: To get CPAP OT Goal Formulation: With patient Time For Goal Achievement: 09/01/24 Potential to Achieve Goals: Good ADL Goals Pt Will Perform Lower Body Dressing: with min assist;sit to/from stand Pt Will Transfer to Toilet: stand pivot transfer;bedside commode;with contact guard assist Pt Will Perform Toileting - Clothing Manipulation and hygiene: with contact guard assist;sit to/from stand;sitting/lateral leans Additional ADL Goal #1: Pt will complete bed mobility at mod I as precursor for OOB ADLs   OT Frequency:  Min 2X/week    Co-evaluation PT/OT/SLP Co-Evaluation/Treatment:  Yes Reason for Co-Treatment: For patient/therapist safety;To address functional/ADL transfers   OT goals addressed during session: ADL's and  self-care      AM-PAC OT 6 Clicks Daily Activity     Outcome Measure Help from another person eating meals?: None Help from another person taking care of personal grooming?: A Little Help from another person toileting, which includes using toliet, bedpan, or urinal?: Total Help from another person bathing (including washing, rinsing, drying)?: A Lot Help from another person to put on and taking off regular upper body clothing?: A Little Help from another person to put on and taking off regular lower body clothing?: A Lot 6 Click Score: 15   End of Session Equipment Utilized During Treatment: Gait belt;Other (comment);Oxygen Laurent) Nurse Communication: Mobility status  Activity Tolerance: Patient tolerated treatment well Patient left: in bed;with call bell/phone within reach;with nursing/sitter in room  OT Visit Diagnosis: Unsteadiness on feet (R26.81);Other abnormalities of gait and mobility (R26.89);Muscle weakness (generalized) (M62.81)                Time: 1610-1640 OT Time Calculation (min): 30 min Charges:  OT General Charges $OT Visit: 1 Visit OT Evaluation $OT Eval Moderate Complexity: 1 Mod  Adrianne BROCKS, OT  Acute Rehabilitation Services Office [phone] Secure chat preferred   Adrianne GORMAN Savers 08/18/2024, 5:19 PM

## 2024-08-18 NOTE — Progress Notes (Signed)
 Physical Therapy Treatment Patient Details Name: [name] MRN: [id number] DOB: 1951-01-28 Today's Date: 08/18/2024   History of Present Illness 74 yo M Presents to ED 08/13/24 after slid out of bed, DC from rehab 2 days prior, mostly bedbound. Patient hypotensive, chronic hypoxia and hypercarbic respiratory failure.    PMHx: CAD, HTN, HLD, inclusion body myositis, DVT, chonic LB edema, paralyzed Rt hemidiaphragm    PT Comments  Currently, pt is mod A for sit to supine needing assistance at the bilateral LE to go supine and min A +2 at the upper trunk to sit at EOB. Pt is min A +2 for transfers but is unable to stand fully without excessive flexion at the trunk which may be indicated by weakness of the erector spinae muscles and weak core stability. Pt is able to assist min to mod in mobility and transfers but tends to be limited to progress towards goals due to anxiety and fatigue that is further complicated by limited strength, ROM, endurance and activity tolerance. Pt would likely benefit from [facility] PT to progress towards functional independence and mobility. Will continue to follow acutely.   If plan is discharge home, recommend the following: Two people to help with walking and/or transfers;A lot of help with bathing/dressing/bathroom;Assist for transportation   Can travel by private vehicle        Equipment Recommendations  Hospital bed;BSC/3in1 (air mattress; bari)    Recommendations for Other Services       Precautions / Restrictions Precautions Precautions: Fall Precaution/Restrictions Comments: monitor sats/HR Restrictions Weight Bearing Restrictions Per Provider Order: No     Mobility  Bed Mobility Overal bed mobility: Needs Assistance Bed Mobility: Supine to Sit, Sit to Supine     Supine to sit: Min assist, +2 for physical assistance, HOB elevated, Used rails Sit to supine: Mod assist, HOB elevated, Used rails   General bed mobility comments: Pt was min A +2 for supine  to sit . Therapist cued pt to sit at EOB by walking legs off EOB and pt hestitated and would look around and therapist asked if pt needed help and pt stated Yea I need help but after cueing again, pt was able to sit at EOB with tactile cues at the upper trunk. Pt needed mod A to lift legs back to supine.    Transfers Overall transfer level: Needs assistance Equipment used: Ambulation equipment used Transfers: Sit to/from Stand Sit to Stand: Min assist, +2 physical assistance, Via lift equipment           General transfer comment: Pt was min A +2 to do sit to stand needing tactile cues at the upper trunk to come to a full stand. Pt needing extensive cues to stand up straight and avoid excessive flexing at the  trunk and pt would correct but deviate to a trunk flexed position. Transfer via Lift Equipment: Stedy  Ambulation/Gait               General Gait Details: Unable to do at this time   Comptroller Bed    Modified Rankin (Stroke Patients Only)       Balance Overall balance assessment: Needs assistance, History of Falls Sitting-balance support: Bilateral upper extremity supported, Feet supported Sitting balance-Leahy Scale: Poor Sitting balance - Comments: Pt sits with a trunk flexed position and BUE on the bed for support.   Standing balance support: During  functional activity, Reliant on assistive device for balance, Bilateral upper extremity supported Standing balance-Leahy Scale: Poor Standing balance comment: When pt is standing in the STEDY, pt tends to maintain a trunk flexed positon and lean excessively forward unable to stand straight despite extensive cues.                            Communication Communication Communication: Impaired Factors Affecting Communication: Hearing impaired  Cognition Arousal: Alert Behavior During Therapy: Anxious, WFL for tasks assessed/performed   PT - Cognitive  impairments: Safety/Judgement                       PT - Cognition Comments: Pt does become increasingly anxious and hyperventilates when laying flat in bed and tends to thrash himself around impacting his safety. Following commands: Intact      Cueing Cueing Techniques: Verbal cues, Tactile cues, Visual cues  Exercises      General Comments General comments (skin integrity, edema, etc.): After returning pt to bed, pt became anxious and throwing his body around stating I cant breathe despite O2 being at 89% on 4L O2 and went as low as 82% due to pt becoming increasingly anxious. RN present and placed pt on CPAP at pt's request      Pertinent Vitals/Pain Pain Assessment Pain Assessment: Faces Faces Pain Scale: Hurts little more Pain Location: R UE Pain Descriptors / Indicators: Aching, Discomfort, Guarding Pain Intervention(s): Limited activity within patient's tolerance    Home Living Family/patient expects to be discharged to:: Private residence Living Arrangements: Spouse/significant other;Children Available Help at Discharge: Family;Available 24 hours/day Type of Home: House Home Access: Level entry       Home Layout: One level Home Equipment: [employer] (2 wheels);Cane - single point;Wheelchair - power;Grab bars - tub/shower;Shower seat;Grab bars - toilet Additional Comments: Pt/spouse live in basement with ground level entry. Spouse uses WC for mobility, reports has 3 power chairs    Prior Function            PT Goals (current goals can now be found in the care plan section) Acute Rehab PT Goals Patient Stated Goal: go home, my wife and children will help PT Goal Formulation: With patient Time For Goal Achievement: 08/30/24 Potential to Achieve Goals: Fair Progress towards PT goals: Progressing toward goals    Frequency    Min 2X/week      PT Plan      Co-evaluation   Reason for Co-Treatment: For patient/therapist safety;To address  functional/ADL transfers PT goals addressed during session: Mobility/safety with mobility;Balance;Strengthening/ROM;Proper use of DME OT goals addressed during session: ADL's and self-care      AM-PAC PT 6 Clicks Mobility   Outcome Measure  Help needed turning from your back to your side while in a flat bed without using bedrails?: A Lot Help needed moving from lying on your back to sitting on the side of a flat bed without using bedrails?: Total Help needed moving to and from a bed to a chair (including a wheelchair)?: Total Help needed standing up from a chair using your arms (e.g., wheelchair or bedside chair)?: Total Help needed to walk in hospital room?: Total Help needed climbing 3-5 steps with a railing? : Total 6 Click Score: 7    End of Session Equipment Utilized During Treatment: Gait belt;Oxygen Activity Tolerance: Patient tolerated treatment well;Patient limited by fatigue Patient left: in bed;with call bell/phone within reach;with bed  alarm set;with nursing/sitter in room Nurse Communication: Mobility status (RN in room to change pt bed to an air mattress.) PT Visit Diagnosis: Unsteadiness on feet (R26.81);Other abnormalities of gait and mobility (R26.89);Repeated falls (R29.6);History of falling (Z91.81);Muscle weakness (generalized) (M62.81);Difficulty in walking, not elsewhere classified (R26.2);Pain Pain - Right/Left: Right Pain - part of body: Arm     Time: 1610-1640 PT Time Calculation (min) (ACUTE ONLY): 30 min  Charges:    $Therapeutic Activity: 8-22 mins                       Murtis CHRISTELLA Ferries, SPT    Maylen Waltermire 08/18/2024, 5:26 PM

## 2024-08-19 ENCOUNTER — Encounter (HOSPITAL_COMMUNITY): Payer: Self-pay | Admitting: Cardiovascular Disease

## 2024-08-19 ENCOUNTER — Other Ambulatory Visit: Payer: Self-pay

## 2024-08-19 DIAGNOSIS — N179 Acute kidney failure, unspecified: Secondary | ICD-10-CM | POA: Diagnosis not present

## 2024-08-19 LAB — GLUCOSE, CAPILLARY
Glucose-Capillary: 68 mg/dL — ABNORMAL LOW (ref 70–99)
Glucose-Capillary: 88 mg/dL (ref 70–99)

## 2024-08-19 LAB — BASIC METABOLIC PANEL WITH GFR
Anion gap: 6 (ref 5–15)
BUN: 9 mg/dL (ref 8–23)
CO2: 37 mmol/L — ABNORMAL HIGH (ref 22–32)
Calcium: 8.6 mg/dL — ABNORMAL LOW (ref 8.9–10.3)
Chloride: 99 mmol/L (ref 98–111)
Creatinine, Ser: 0.57 mg/dL — ABNORMAL LOW (ref 0.61–1.24)
GFR, Estimated: >60 mL/min
Glucose, Bld: 74 mg/dL (ref 70–99)
Potassium: 3.9 mmol/L (ref 3.5–5.1)
Sodium: 142 mmol/L (ref 135–145)

## 2024-08-19 LAB — CBC
HCT: 28.6 % — ABNORMAL LOW (ref 39.0–52.0)
Hemoglobin: 9.2 g/dL — ABNORMAL LOW (ref 13.0–17.0)
MCH: 35.5 pg — ABNORMAL HIGH (ref 26.0–34.0)
MCHC: 32.2 g/dL (ref 30.0–36.0)
MCV: 110.4 fL — ABNORMAL HIGH (ref 80.0–100.0)
Platelets: 84 10*3/uL — ABNORMAL LOW (ref 150–400)
RBC: 2.59 MIL/uL — ABNORMAL LOW (ref 4.22–5.81)
RDW: 13.8 % (ref 11.5–15.5)
WBC: 3.5 10*3/uL — ABNORMAL LOW (ref 4.0–10.5)
nRBC: 0 % (ref 0.0–0.2)

## 2024-08-19 MED ORDER — SODIUM CHLORIDE 0.9% FLUSH
10.0000 mL | Freq: Two times a day (BID) | INTRAVENOUS | Status: DC
  Administered 2024-08-19: 20 mL
  Administered 2024-08-20: 10 mL
  Administered 2024-08-20: 20 mL
  Administered 2024-08-22 (×2): 10 mL

## 2024-08-19 MED ORDER — GUAIFENESIN 100 MG/5ML PO LIQD
5.0000 mL | ORAL | Status: DC | PRN
  Administered 2024-08-21: 5 mL via ORAL
  Filled 2024-08-19: qty 10

## 2024-08-19 MED ORDER — SODIUM CHLORIDE 0.9% FLUSH: 10.0000 mL | INTRAVENOUS | Status: DC | PRN

## 2024-08-19 MED ORDER — FUROSEMIDE 10 MG/ML IJ SOLN
20.0000 mg | Freq: Once | INTRAMUSCULAR | Status: AC
  Administered 2024-08-19: 20 mg via INTRAVENOUS
  Filled 2024-08-19: qty 2

## 2024-08-19 NOTE — Progress Notes (Signed)
 Peripherally Inserted Central Catheter Placement  The IV Nurse has discussed with the patient and/or persons authorized to consent for the patient, the purpose of this procedure and the potential benefits and risks involved with this procedure.  The benefits include less needle sticks, lab draws from the catheter, and the patient may be discharged home with the catheter. Risks include, but not limited to, infection, bleeding, blood clot (thrombus formation), and puncture of an artery; nerve damage and irregular heartbeat and possibility to perform a PICC exchange if needed/ordered by physician.  Alternatives to this procedure were also discussed.  Bard Power PICC patient education guide, fact sheet on infection prevention and patient information card has been provided to patient /or left at bedside.    PICC Placement Documentation  PICC Single Lumen 08/19/24 Right Brachial 41 cm 0 cm (Active)  Indication for Insertion or Continuance of Line Home intravenous therapies 08/19/24 1705  Exposed Catheter (cm) 0 cm 08/19/24 1705  Site Assessment Clean, Dry, Intact 08/19/24 1705  Line Status Flushed;Saline locked;Blood return noted 08/19/24 1705  Dressing Type Transparent;Securing device 08/19/24 1705  Dressing Status Antimicrobial disc/dressing in place;Clean, Dry, Intact 08/19/24 1705  Line Care Connections checked and tightened 08/19/24 1705  Line Adjustment (NICU/IV Team Only) No 08/19/24 1705  Dressing Intervention New dressing;Adhesive placed at insertion site (IV team only) 08/19/24 1705  Dressing Change Due 08/26/24 08/19/24 1705       Herald Vallin Sheral Ruth 08/19/2024, 5:06 PM

## 2024-08-19 NOTE — Progress Notes (Signed)
 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  Patient placed on cefazolin . - I looked at his right lower extremity wound, no concern for active infection.  Image review of sacral wounds does show some erythema I suspect that is the source of bacteremia.  - TTE no vegetation, TEE no veg Recommendations:  -Repeat blood Cx ng x3 days. Ok to place picc(ordered). Plan on 2 weeks of abx with cefazolin  from negative blood Cx eot 08/28/33 - Continue cefazolin  - Continue wound care - Standard precautions   Microbiology:   Cefazolin  1/20-present   Cultures: Blood 1/19 1 out of 2 sets,2/4 bottles growing MSSA and staph epi 1/21 ng Urine   SUBJECTIVE: Resting in bed. More alert today.  Interval: Afebrile overnight.    Review of Systems: Review of Systems  All other systems reviewed and are negative.    Scheduled Meds:  apixaban   2.5 mg Oral BID   Chlorhexidine  Gluconate Cloth  6 each Topical Daily   ezetimibe   10 mg Oral QPM   liver oil-zinc  oxide  1 Application Topical BID   tamsulosin   0.4 mg Oral Daily   thiamine   100 mg Oral Daily   Continuous Infusions:   ceFAZolin  (ANCEF ) IV 2 g (08/18/24  1722)   PRN Meds:.acetaminophen  **OR** acetaminophen , albuterol , ondansetron  **OR** ondansetron  (ZOFRAN ) IV, mouth rinse, traZODone  Allergies[1]  OBJECTIVE: Vitals:   08/18/24 0514 08/18/24 1157 08/18/24 1956 08/18/24 2346  BP:  (!) 134/90 (!) 142/58 (!) 112/57  Pulse: 91 96 (!) 102 95  Resp: (!) 23 20 19  (!) 21  Temp:  98.1 F (36.7 C) (!) 97.5 F (36.4 C) 98.3 F (36.8 C)  TempSrc:  Oral Oral Axillary  SpO2: 92% 97% 100% 100%  Weight: 125.4 kg     Height:       Body mass index is 38.56 kg/m.  Physical Exam Constitutional:      General: He is not in acute distress.    Appearance: He is normal weight. He is not toxic-appearing.  HENT:     Head: Normocephalic and atraumatic.     Right Ear: External ear normal.     Left Ear: External ear normal.     Nose: No congestion or rhinorrhea.     Mouth/Throat:     Mouth: Mucous membranes are moist.     Pharynx: Oropharynx is clear.  Eyes:     Extraocular Movements: Extraocular movements intact.     Conjunctiva/sclera: Conjunctivae normal.     Pupils: Pupils are equal, round, and reactive to light.  Cardiovascular:     Rate and Rhythm: Normal rate and regular rhythm.     Heart sounds: No murmur heard.    No  friction rub. No gallop.  Pulmonary:     Effort: Pulmonary effort is normal.     Breath sounds: Normal breath sounds.  Abdominal:     General: Abdomen is flat. Bowel sounds are normal.     Palpations: Abdomen is soft.  Musculoskeletal:     Cervical back: Normal range of motion and neck supple.  Skin:    General: Skin is warm and dry.  Neurological:     General: No focal deficit present.     Mental Status: He is oriented to person, place, and time.  Psychiatric:        Mood and Affect: Mood normal.       Lab Results Lab Results  Component Value Date   WBC 5.5 08/16/2024   HGB 9.4 (L) 08/16/2024   HCT 29.7 (L) 08/16/2024   MCV 112.5 (H) 08/16/2024   PLT 89 (L) 08/16/2024    Lab Results  Component Value  Date   CREATININE 0.59 (L) 08/17/2024   BUN 10 08/17/2024   NA 142 08/17/2024   K 4.2 08/17/2024   CL 102 08/17/2024   CO2 37 (H) 08/17/2024    Lab Results  Component Value Date   ALT <5 08/17/2024   AST 41 08/17/2024   ALKPHOS 64 08/17/2024   BILITOT 0.4 08/17/2024        [name] Stank, MD [facility] 08/19/2024, 12:15 AM Evaluation of this patient requires complex antimicrobial therapy evaluation and counseling + isolation needs for disease transmission risk assessment and mitigation       [1] No Known Allergies  "

## 2024-08-19 NOTE — Plan of Care (Signed)
  Problem: Clinical Measurements: Goal: Will remain free from infection Outcome: Progressing   Problem: Activity: Goal: Risk for activity intolerance will decrease Outcome: Progressing   Problem: Nutrition: Goal: Adequate nutrition will be maintained Outcome: Progressing   Problem: Coping: Goal: Level of anxiety will decrease Outcome: Progressing   Problem: Safety: Goal: Ability to remain free from injury will improve Outcome: Progressing   Problem: Skin Integrity: Goal: Risk for impaired skin integrity will decrease Outcome: Progressing

## 2024-08-19 NOTE — TOC Progression Note (Addendum)
 Transition of Care (TOC) - Progression Note  Rayfield Gobble RN, BSN Inpatient Care Management Unit 4E- RN Case Manager See Treatment Team for direct phone #   Patient Details  Name: [name] MRN: [id number] Date of Birth: 06-24-1951  Transition of Care [facility]) CM/SW Contact  Gobble Rayfield Hurst, RN Phone Number: 08/19/2024, 12:57 PM  Clinical Narrative:    Noted per ID note- recommendations for home IV abx on discharge for 2wks.   Per previous CM note- referral for NIV/BIPAP was sent to [facility]- CM has confirmed with liaison that NIV referral received and NIV has been approved. [facility] following for discharge timing and delivery at that time.   Call made to [facility] liaison w/ [facility]- her team will follow for home IV abx needs.  CM will follow up for [facility] selected in the Hub- will need orders for HHRN/PT/OT.  Will also assess for DME needs- pt has RW, cane, wheelchair, shower seat- may need hospital bed, [facility]  SDOH addressed and resources placed on AVS   Expected Discharge Plan: Home w Home Health Services Barriers to Discharge: Continued Medical Work up (Pt scheduled for TEE on 1/23)               Expected Discharge Plan and Services In-house Referral: NA Discharge Planning Services: CM Consult Post Acute Care Choice: Durable Medical Equipment, Home Health Living arrangements for the past 2 months: Single Family Home                 DME Arranged: NIV DME Agency: Other - Comment ([facility]) Date DME Agency Contacted: 08/15/24 Time DME Agency Contacted: 1130 Representative spoke with at DME Agency: Norene Ligas- rep with [facility] [phone] HH Arranged: RN, IV Antibiotics, PT, OT HH Agency: [facility] Date HH Agency Contacted: 08/19/24   Representative spoke with at [facility] Agency: Pam   Social Drivers of Health (SDOH) Interventions SDOH Screenings   Food Insecurity: No Food Insecurity (08/13/2024)  Housing: Low Risk  (08/13/2024)  Transportation Needs: No Transportation Needs (08/13/2024)  Utilities: Not At Risk (08/13/2024)  Alcohol Screen: Low Risk (06/05/2024)  Depression (PHQ2-9): Low Risk (12/28/2023)  Financial Resource Strain: Low Risk (06/05/2024)  Physical Activity: Inactive (06/05/2024)  Social Connections: Socially Isolated (08/13/2024)  Stress: No Stress Concern Present (06/05/2024)  Tobacco Use: Medium Risk (08/13/2024)  Health Literacy: Adequate Health Literacy (12/28/2023)    Readmission Risk Interventions    08/16/2024   11:30 AM  Readmission Risk Prevention Plan  Transportation Screening Complete  PCP or Specialist Appt within 5-7 Days Complete  Home Care Screening Complete  Medication Review (RN CM) Complete

## 2024-08-19 NOTE — Progress Notes (Signed)
 OPAT ORDERS:  Diagnosis: MSSA bacteremia likely secondary to sacral wounds  Allergies[1]   Discharge antibiotics to be given via PICC line:  Per pharmacy protocol cefazolin  2gm q8h   Duration: 2 weeks from negative blood cs 1/21 End Date: 2/3  [facility] Per Protocol with [employer] Use: Home health RN for IV administration and teaching, line care and labs.    Labs weekly while on IV antibiotics: __x CBC with differential __ BMP **TWICE WEEKLY ON VANCOMYCIN   _x_ CMP __ CRP __ ESR __ Vancomycin  trough TWICE WEEKLY __ CK  _x_ Please pull PIC at completion of IV antibiotics __ Please leave PIC in place until doctor has seen patient or been notified  Fax weekly labs to [phone]  Clinic Follow Up Appt: 2/2  @ [facility] with Dr. Dennise     [1] No Known Allergies

## 2024-08-19 NOTE — Progress Notes (Signed)
 [facility]  PROGRESS NOTE  [name] FMW:[id number] DOB: Nov 23, 1950 DOA: 08/13/2024 PCP: Candise Aleene DEL, MD   Brief HPI:    74 y.o. adult with past medical history significant for inclusion body myositis, HTN, history of DVT on Eliquis , chronic diastolic congestive heart failure, chronic hypoxic respite failure (2-4L Victoria Vera baseline), venous insufficiency with chronic bilateral lower extremity edema, right lower extremity wounds (follows with wound care clinc), chronic hypoxic/hypercarbic respiratory failure, OSA, CAD, HLD, obesity who presented to [facility] ED on 08/13/2024 from home via EMS due to fall, apparently slid out of his bed, increased weakness.  Patient denied loss of consciousness, did not strike his head.  Recently discharged from SNF a few days prior.  Reports continue to take his prescribed occasions but has been taking a few extra doses of his torsemide  over the last few days to attempt to improve his lower extremity edema.   Significant Hospital events: 1/19: Admit TR[facility], received 3.5 L IVF, 12.5 g albumin  x 2, started on midodrine  1/20: Transferred to ICU, PCCM consulted, discontinue midodrine  due to reflex bradycardia, started on norepinephrine , started on cefazolin  for positive blood culture with MSSA, staph epi.  Infectious disease consulted 1/21: Norepinephrine  titrated off 1/22: Remains off of vasopressors, transfer to telemetry, PCCM signed off 1/23- TEE   Assessment/Plan:   Shock, hypovolemic vs septic MSSA bacteremia Staphylococcus epidermidis bacteremia Patient presenting after fall from home, apparently slid out of his bed.   Recently discharged from SNF a few days prior.  Recently admitted for septic shock secondary to lower extremity cellulitis/wounds.    Lactic acid elevated 2.2.  Blood pressure 70/35.   Apparently has been taking additional doses of his oral diuretic to help with his lower extremity edema over the last few days.  Suspect etiology  likely secondary to hypovolemia versus cardiogenic.  Patient received 3.5 L of aggressive IV fluid resuscitation, 25 g of IV albumin  and started on midodrine  without significant improvement of blood pressure.  TTE with LVEF 7975%, LV with no regional wall motion normalities, LV diastolic parameters indeterminate, LA mildly dilated, RA moderately dilated, mild dilation ascending aorta measuring 41 mm, IVC dilated in size no vegetations noted. 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  Was 0.64 on 07/20/2024.   Likely secondary to prerenal azotemia from excessive diuretic use versus ATN from hypotension.   Received aggressive IV fluid resuscitation and IV albumin  on admission. -- Cr 3.14>2.67>1.72>0.87 -- Holding home lisinopril  and atenolol  -- d/c foley catheter -- Strict I's and O's -flomax    Elevated LFTs  Unclear etiology, suspect mild shock liver due to hypotension as above. -- Holding home statin  Delirium - Appears improved from prior - Delirium precautions     Peripheral vascular disease Venous insufficiency Right lower extremity wounds Sacral wound Patient follows with wound clinic outpatient, Dr. Marolyn Wound 07/10/24 Pressure Injury Buttocks Right;Left Deep Tissue Pressure Injury - Purple or maroon localized area of discolored intact skin or blood-filled blister due to damage of underlying soft tissue from pressure and/or shear. (Active)     Wound 08/13/24 1840 Pressure Injury Coccyx Mid Stage 2 -  Partial thickness loss of dermis presenting as a shallow open injury with a red, pink wound bed without slough.  (Active)     Wound 08/13/24 1840 Pressure Injury Buttocks Right;Left;Medial Deep Tissue Pressure Injury - Purple or maroon localized area of discolored intact skin or blood-filled blister due to damage of underlying soft tissue from pressure and/or shear. (Active)     Wound 08/13/24 1841 Pressure Injury Ischial tuberosity Right;Left Deep Tissue Pressure Injury - Purple or maroon localized area of discolored intact skin or blood-filled blister due to damage of underlying soft tissue from pressure and/or shear. (Active)  -- Wound RN consulted -- ABI 1/20 abnormal with noncompressible right and left lower extremity arteries. 1. Cleanse sacrum/coccyx with NS, apply a small piece of (Aquacel AG) silver hydrofiber to coccyx and secure with silicone foam. 2. Cleanse B ischium/posterior thighs with NS, apply Xeroform gauze to wounds daily and secure with silicone foam. 3. If patient is still in the hospital on Wed, 1/28, please change right leg dressing as follows: Cut piece of Aquacel (#[id number]) and apply to right posterior leg, then cover with foam dressing.  Moisten with NS each time to remove previous dressing.  Apply kerlex, beginning just behind toes to below knee, then ace wrap in the same manner.   -- Would benefit from vascular surgery follow-up outpatient   History of DVT -- Eliquis  2.5 m p.o. twice daily   CAD HLD -- Zetia  10 mg p.o. daily -- Hold home Crestor  due to elevated LFTs   OSA -- BiPAP -- TOC consulted for home DME NIV [name] requires volume targeted ventilation via NIV/HMV for nocturnal and daytime use for CRF secondary to Obesity Hypoventilation Syndrome.  PaCO2 confirmed at 54 on ABG. Alarms and internal battery are required to ensure continuous support and immediate recognition of ventilatory insufficiency. A RAD with backup rate is insufficient due to the severity of illness. Volume ventilation is needed to reduce work of breathing and fatigue and to prevent life threatening  exacerbations. OSA is not the predominant cause of hypercapnia.   BPH -- Flomax    Obesity, class II Estimated body mass index is 38.74 kg/m as calculated from the following:   Height as of this encounter: 5' 11 (1.803 m).   Weight as of this encounter: 126 kg.         DVT prophylaxis: Eliquis   Medications     apixaban   2.5 mg Oral BID   Chlorhexidine  Gluconate Cloth  6 each Topical Daily   ezetimibe   10 mg Oral QPM   liver oil-zinc  oxide  1 Application Topical BID   tamsulosin   0.4 mg Oral Daily   thiamine   100 mg Oral Daily     Data Reviewed:   CBG:  Recent Labs  Lab 08/18/24 0701 08/18/24 1155 08/18/24 2150 08/19/24 0618 08/19/24 0734  GLUCAP 95 72 68* 68* 88    SpO2: 100 % O2 Flow Rate (L/min): 4 L/min FiO2 (%): 36 %    Vitals:   08/19/24 0000 08/19/24 0403 08/19/24 0500 08/19/24 0735  BP:  (!) 152/76  (!) 154/58  Pulse: 88 89  86  Resp: 20 (!) 22  20  Temp:  98.1 F (36.7 C)    TempSrc:  Axillary    SpO2: 100% 99%  100%  Weight:   126 kg   Height:          Data Reviewed:  Basic Metabolic Panel: Recent Labs  Lab 08/14/24 0332 08/14/24 0352 08/14/24 1531 08/15/24  9668 08/16/24 0350 08/17/24 0830 08/19/24 0344  NA  --    < > 140 141 142 142 142  K  --    < > 4.7 4.2 4.2 4.2 3.9  CL  --    < > 99 100 102 102 99  CO2  --    < > 33* 34* 35* 37* 37*  GLUCOSE  --    < > 154* 106* 92 79 74  BUN  --    < > 45* 39* 22 10 9   CREATININE  --    < > 2.35* 1.72* 0.87 0.59* 0.57*  CALCIUM   --    < > 8.4* 8.4* 8.7* 9.0 8.6*  MG 2.0  --   --  2.0  --   --   --   PHOS  --   --  4.9* 3.3  --   --   --    < > = values in this interval not displayed.    CBC: Recent Labs  Lab 08/13/24 1234 08/14/24 0352 08/14/24 1025 08/15/24 0331 08/16/24 0350 08/19/24 0344  WBC 9.3 7.9  --  7.9 5.5 3.5*  HGB 10.9* 8.9* 9.2* 9.2* 9.4* 9.2*  HCT 35.0* 28.0* 27.0* 28.5* 29.7* 28.6*  MCV 111.1* 109.8*  --  111.3* 112.5* 110.4*  PLT 100* 74*  --  105* 89*  84*    LFT Recent Labs  Lab 08/13/24 1234 08/14/24 1531 08/15/24 0331 08/16/24 0350 08/17/24 0830  AST 82*  --  86* 65* 41  ALT 20  --  21 13 <5  ALKPHOS 92  --  70 69 64  BILITOT 0.6  --  0.3 0.3 0.4  PROT 6.3*  --  5.2* 5.4* 5.3*  ALBUMIN  3.6 3.2* 3.0* 3.0* 3.0*     Antibiotics: Anti-infectives (From admission, onward)    Start     Dose/Rate Route Frequency Ordered Stop   08/14/24 1800  ceFAZolin  (ANCEF ) IVPB 2g/100 mL premix        2 g 200 mL/hr over 30 Minutes Intravenous Every 8 hours 08/14/24 1703          CONSULTS infectious disease Cardiology (TEE)  Code Status: Full code  Family Communication: No family at bedside     Subjective   Overall breathing better but likes when wearing the BiPAP as it helps relieve his work of breathing   Objective    Physical Examination:    General: Appearance:    Obese adult in no acute distress     Lungs:   Diminished, no wheezing, on nasal cannula  Heart:    Normal heart rate. Normal rhythm. No murmurs, rubs, or gallops.   MS:   All extremities are intact.  Chronic skin changes  Neurologic:   Awake, alert-hard of hearing    Harlene RAYMOND Bowl   [facility] If 7PM-7AM, please contact night-coverage at [email or web address], Office  [phone]   08/19/2024, 11:20 AM  LOS: 6 days

## 2024-08-19 NOTE — Discharge Instructions (Signed)
 Social Connections Resources   Active Adults Program https://www.[place].gov/departments/parks-recreation/active-adults-50  -Adding Health to Our Years Courses  -Aquatics  -Exercise Classes: Yoga, Dance, [place], etc.  -Book Club, Games  Locations vary by activity - Main Contact # [phone]) - see calendars on website listed above.  Senior Centers -[facility] [address] [place], [place] [address] / [phone] ext [phone] -[facility] [address] [place], [place] [address] / [phone] [facility]) [address], [place], [place] [address] / [phone] [facility] locations in [place], [place], and [place] / [phone] -[facility] [address] [place], [place] [address] / [phone]  Program of [facility] the Elderly (PACE) [address] [place], [place] [address] - Office #: [phone] - Enrollment Phone #: [phone]  [facility] Line  Call toll free, available 24 hours a day, [phone]   [place] [address]  [place] 2-1-1 is another useful way to locate resources in the community. Visit shedsizes.ch to find service information online. If you need additional assistance, 2-1-1 Referral Specialists are available 24 hours a day, every day by dialing 2-1-1 or [phone] from any phone. The call is free, confidential, and available in any language.

## 2024-08-19 NOTE — Care Management Important Message (Signed)
 Important Message  Patient Details  Name: [name] MRN: [id number] Date of Birth: [birth year]   Important Message Given:  Yes - Medicare IM  Printed to unit and bedside RN to give to pt   Charlann Rayfield Hurst, RN 08/19/2024, 10:11 AM

## 2024-08-20 ENCOUNTER — Inpatient Hospital Stay (HOSPITAL_COMMUNITY)

## 2024-08-20 DIAGNOSIS — A4102 Sepsis due to Methicillin resistant Staphylococcus aureus: Secondary | ICD-10-CM | POA: Diagnosis not present

## 2024-08-20 DIAGNOSIS — I330 Acute and subacute infective endocarditis: Secondary | ICD-10-CM | POA: Diagnosis not present

## 2024-08-20 DIAGNOSIS — R6521 Severe sepsis with septic shock: Secondary | ICD-10-CM

## 2024-08-20 DIAGNOSIS — N179 Acute kidney failure, unspecified: Secondary | ICD-10-CM | POA: Diagnosis not present

## 2024-08-20 LAB — CBC
HCT: 31.1 % — ABNORMAL LOW (ref 39.0–52.0)
Hemoglobin: 10.2 g/dL — ABNORMAL LOW (ref 13.0–17.0)
MCH: 34.6 pg — ABNORMAL HIGH (ref 26.0–34.0)
MCHC: 32.8 g/dL (ref 30.0–36.0)
MCV: 105.4 fL — ABNORMAL HIGH (ref 80.0–100.0)
Platelets: 91 10*3/uL — ABNORMAL LOW (ref 150–400)
RBC: 2.95 MIL/uL — ABNORMAL LOW (ref 4.22–5.81)
RDW: 13.9 % (ref 11.5–15.5)
WBC: 3.3 10*3/uL — ABNORMAL LOW (ref 4.0–10.5)
nRBC: 0 % (ref 0.0–0.2)

## 2024-08-20 LAB — BASIC METABOLIC PANEL WITH GFR
Anion gap: 8 (ref 5–15)
BUN: 11 mg/dL (ref 8–23)
CO2: 36 mmol/L — ABNORMAL HIGH (ref 22–32)
Calcium: 9 mg/dL (ref 8.9–10.3)
Chloride: 97 mmol/L — ABNORMAL LOW (ref 98–111)
Creatinine, Ser: 0.67 mg/dL (ref 0.61–1.24)
GFR, Estimated: >60 mL/min
Glucose, Bld: 127 mg/dL — ABNORMAL HIGH (ref 70–99)
Potassium: 3.7 mmol/L (ref 3.5–5.1)
Sodium: 141 mmol/L (ref 135–145)

## 2024-08-20 LAB — CULTURE, BLOOD (ROUTINE X 2)
Culture: NO GROWTH
Culture: NO GROWTH
Special Requests: ADEQUATE
Special Requests: ADEQUATE

## 2024-08-20 LAB — GLUCOSE, CAPILLARY: Glucose-Capillary: 69 mg/dL — ABNORMAL LOW (ref 70–99)

## 2024-08-20 MED ORDER — CEFAZOLIN IV (FOR PTA / DISCHARGE USE ONLY): 2.0000 g | Freq: Three times a day (TID) | INTRAVENOUS | 0 refills | Status: AC

## 2024-08-20 MED ORDER — FUROSEMIDE 10 MG/ML IJ SOLN
20.0000 mg | Freq: Once | INTRAMUSCULAR | Status: AC
  Administered 2024-08-20: 20 mg via INTRAVENOUS
  Filled 2024-08-20: qty 2

## 2024-08-20 MED ORDER — ATENOLOL 25 MG PO TABS
25.0000 mg | ORAL_TABLET | Freq: Two times a day (BID) | ORAL | Status: DC
  Administered 2024-08-20 – 2024-08-22 (×5): 25 mg via ORAL
  Filled 2024-08-20 (×7): qty 1

## 2024-08-20 MED ORDER — ROSUVASTATIN CALCIUM 5 MG PO TABS
5.0000 mg | ORAL_TABLET | Freq: Every day | ORAL | Status: DC
  Administered 2024-08-20 – 2024-08-22 (×3): 5 mg via ORAL
  Filled 2024-08-20 (×3): qty 1

## 2024-08-20 NOTE — TOC Progression Note (Addendum)
 Transition of Care (TOC) - Progression Note  Rayfield Gobble RN, BSN Inpatient Care Management Unit 4E- RN Case Manager See Treatment Team for direct phone #   Patient Details  Name: [name] MRN: [id number] Date of Birth: 1950-11-26  Transition of Care [facility]) CM/SW Contact  Gobble Rayfield Hurst, RN Phone Number: 08/20/2024, 11:43 AM  Clinical Narrative:    OPAT order in per ID/Pharmacy- CM has reached out to [facility] w/ home infusion- [facility] who will follow up.  Per MD EDD- Weds ?? At the earliest pending volume status.   CM spoke with [facility] liaison regarding HH needs, [facility] had referral from recent SNF discharge.  Per liaison [facility] will not be able to service for current IV abx needs- they do not have adequate staffing at this time due to winter weather.   Call made to wife to follow up on [facility] needs- per wife they do not have a preference for [facility] and are agreeable to refer out to other agencies. Updated wife as well on NIV approval and [facility] to follow up for delivery at time of discharge. Wife voiced understanding.   Referrals placed in Hub for [facility] needs- CM will follow up.  1245- confirmed with [facility] that they have all available services- referral has been accepted Need HH orders for RN/PT/OT     Expected Discharge Plan: Home w Home Health Services Barriers to Discharge: Continued Medical Work up (Pt scheduled for TEE on 1/23)               Expected Discharge Plan and Services In-house Referral: NA Discharge Planning Services: CM Consult Post Acute Care Choice: Durable Medical Equipment, Home Health Living arrangements for the past 2 months: Single Family Home                 DME Arranged: NIV DME Agency: Other - Comment ([facility]) Date DME Agency Contacted: 08/15/24 Time DME Agency Contacted: 1130 Representative spoke with at DME Agency: Norene Ligas- rep with [facility] [phone] HH Arranged: RN, IV Antibiotics, PT, OT HH Agency:  [facility] Date HH Agency Contacted: 08/19/24   Representative spoke with at [facility] Agency: Pam   Social Drivers of Health (SDOH) Interventions SDOH Screenings   Food Insecurity: No Food Insecurity (08/13/2024)  Housing: Low Risk (08/13/2024)  Transportation Needs: No Transportation Needs (08/13/2024)  Utilities: Not At Risk (08/13/2024)  Alcohol Screen: Low Risk (06/05/2024)  Depression (PHQ2-9): Low Risk (12/28/2023)  Financial Resource Strain: Low Risk (06/05/2024)  Physical Activity: Inactive (06/05/2024)  Social Connections: Socially Isolated (08/13/2024)  Stress: No Stress Concern Present (06/05/2024)  Tobacco Use: Medium Risk (08/13/2024)  Health Literacy: Adequate Health Literacy (12/28/2023)    Readmission Risk Interventions    08/16/2024   11:30 AM  Readmission Risk Prevention Plan  Transportation Screening Complete  PCP or Specialist Appt within 5-7 Days Complete  Home Care Screening Complete  Medication Review (RN CM) Complete

## 2024-08-20 NOTE — Progress Notes (Signed)
 PHARMACY CONSULT NOTE FOR:  OUTPATIENT  PARENTERAL ANTIBIOTIC THERAPY (OPAT)  Indication: MSSA bacteremia  Regimen: cefazolin  2g IV every 8 hours  End date: 08/28/2024  IV antibiotic discharge orders are pended. To discharging provider:  please sign these orders via discharge navigator,  Select New Orders & click on the button choice - Manage This Unsigned Work.    Thank you for allowing pharmacy to be a part of this patient's care.  Feliciano Close, PharmD PGY2 Infectious Diseases Pharmacy Resident

## 2024-08-20 NOTE — Anesthesia Postprocedure Evaluation (Signed)
"   Anesthesia Post Note  Patient: [name]  Procedure(s) Performed: TRANSESOPHAGEAL ECHOCARDIOGRAM     Patient location during evaluation: Cath Lab Anesthesia Type: MAC Level of consciousness: awake and alert Pain management: pain level controlled Vital Signs Assessment: post-procedure vital signs reviewed and stable Respiratory status: spontaneous breathing, nonlabored ventilation, respiratory function stable and patient connected to nasal cannula oxygen Cardiovascular status: stable and blood pressure returned to baseline Postop Assessment: no apparent nausea or vomiting Anesthetic complications: no   There were no known notable events for this encounter.  Last Vitals:  Vitals:   08/20/24 0821 08/20/24 1211  BP: (!) 155/80 (!) 126/56  Pulse: 100 (!) 107  Resp: (!) 22 20  Temp: 36.8 C (!) 36.1 C  SpO2: 100% 100%    Last Pain:  Vitals:   08/20/24 1211  TempSrc: Oral  PainSc:                  Amia Rynders S      "

## 2024-08-20 NOTE — Progress Notes (Signed)
 [facility]  PROGRESS NOTE  [name] FMW:[id number] DOB: [birth year] DOA: 08/13/2024 PCP: Candise Aleene DEL, MD   Brief HPI:    74 y.o. adult with past medical history significant for inclusion body myositis, HTN, history of DVT on Eliquis , chronic diastolic congestive heart failure, chronic hypoxic respite failure (2-4L [place] baseline), venous insufficiency with chronic bilateral lower extremity edema, right lower extremity wounds (follows with wound care clinc), chronic hypoxic/hypercarbic respiratory failure, OSA, CAD, HLD, obesity who presented to [facility] ED on 08/13/2024 from home via EMS due to fall, apparently slid out of his bed, increased weakness.  Patient denied loss of consciousness, did not strike his head.  Recently discharged from SNF a few days prior.  Reports continue to take his prescribed occasions but has been taking a few extra doses of his torsemide  over the last few days to attempt to improve his lower extremity edema.   Significant Hospital events: 1/19: Admit [facility], received 3.5 L IVF, 12.5 g albumin  x 2, started on midodrine  1/20: Transferred to ICU, PCCM consulted, discontinue midodrine  due to reflex bradycardia, started on norepinephrine , started on cefazolin  for positive blood culture with MSSA, staph epi.  Infectious disease consulted 1/21: Norepinephrine  titrated off 1/22: Remains off of vasopressors, transfer to telemetry, PCCM signed off 1/23- TEE 1/25-volume overload-dosing Lasix  daily   Assessment/Plan:   Shock, hypovolemic vs septic MSSA bacteremia Staphylococcus epidermidis bacteremia Patient presenting after fall from home, apparently slid out of his bed.   Recently discharged from SNF a few days prior.  Recently admitted for septic shock secondary to lower extremity cellulitis/wounds.    Lactic acid elevated 2.2.  Blood pressure 70/35.   Apparently has been taking additional doses of his oral diuretic to help with his lower extremity edema  over the last few days.  Suspect etiology likely secondary to hypovolemia versus cardiogenic.  Patient received 3.5 L of aggressive IV fluid resuscitation, 25 g of IV albumin  and started on midodrine  without significant improvement of blood pressure.  TTE with LVEF 7975%, LV with no regional wall motion normalities, LV diastolic parameters indeterminate, LA mildly dilated, RA moderately dilated, mild dilation ascending aorta measuring 41 mm, IVC dilated in size no vegetations noted. 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  Was 0.64 on 07/20/2024.   Likely secondary to prerenal azotemia from excessive diuretic use versus ATN from hypotension.   Received aggressive IV fluid resuscitation and IV albumin  on admission. -- Cr 3.14>2.67>1.72>0.87 -- Resume atenolol  -- d/c foley catheter -- Strict I's and O's -flomax  -Now appears volume overloaded from volume resuscitation so will dose IV Lasix  daily and monitor for output -Will repeat chest x-ray   Elevated LFTs  Unclear etiology, suspect mild shock liver due to hypotension as above. -Resolved so will resume statin  Delirium - Appears improved  - Delirium precautions     Peripheral vascular disease Venous insufficiency Right lower extremity wounds Sacral wound Patient follows with wound clinic outpatient, Dr. Marolyn Wound 07/10/24 Pressure Injury Buttocks Right;Left Deep Tissue Pressure Injury - Purple or maroon localized area of discolored intact skin or blood-filled blister due to damage of  underlying soft tissue from pressure and/or shear. (Active)     Wound 08/13/24 1840  Pressure Injury Coccyx Mid Stage 2 -  Partial thickness loss of dermis presenting as a shallow open injury with a red, pink wound bed without slough. (Active)     Wound 08/13/24 1840 Pressure Injury Buttocks Right;Left;Medial Deep Tissue Pressure Injury - Purple or maroon localized area of discolored intact skin or blood-filled blister due to damage of underlying soft tissue from pressure and/or shear. (Active)     Wound 08/13/24 1841 Pressure Injury Ischial tuberosity Right;Left Deep Tissue Pressure Injury - Purple or maroon localized area of discolored intact skin or blood-filled blister due to damage of underlying soft tissue from pressure and/or shear. (Active)  -- Wound RN consulted -- ABI 1/20 abnormal with noncompressible right and left lower extremity arteries. 1. Cleanse sacrum/coccyx with NS, apply a small piece of (Aquacel AG) silver hydrofiber to coccyx and secure with silicone foam. 2. Cleanse B ischium/posterior thighs with NS, apply Xeroform gauze to wounds daily and secure with silicone foam. 3. If patient is still in the hospital on Wed, 1/28, please change right leg dressing as follows: Cut piece of Aquacel (#[id number]) and apply to right posterior leg, then cover with foam dressing.  Moisten with NS each time to remove previous dressing.  Apply kerlex, beginning just behind toes to below knee, then ace wrap in the same manner.   -- Would benefit from vascular surgery follow-up outpatient   History of DVT -- Eliquis  2.5 m p.o. twice daily   CAD HLD -- Zetia  10 mg p.o. daily -- Resume Crestor    OSA -- BiPAP -- TOC consulted for home DME NIV Mr Vanwingerden requires volume targeted ventilation via NIV/HMV for nocturnal and daytime use for CRF secondary to Obesity Hypoventilation Syndrome.  PaCO2 confirmed at 54 on ABG. Alarms and internal battery are required to ensure continuous support and immediate recognition of ventilatory insufficiency. A RAD with backup rate is insufficient due to  the severity of illness. Volume ventilation is needed to reduce work of breathing and fatigue and to prevent life threatening exacerbations. OSA is not the predominant cause of hypercapnia.   BPH -- Flomax    Obesity, class II Estimated body mass index is 37.82 kg/m as calculated from the following:   Height as of this encounter: 5' 11 (1.803 m).   Weight as of this encounter: 123 kg.         DVT prophylaxis: Eliquis   Medications     apixaban   2.5 mg Oral BID   atenolol   25 mg Oral BID   Chlorhexidine  Gluconate Cloth  6 each Topical Daily   ezetimibe   10 mg Oral QPM   furosemide   20 mg Intravenous Once   liver oil-zinc  oxide  1 Application Topical BID   rosuvastatin   5 mg Oral Daily   sodium chloride  flush  10-40 mL Intracatheter Q12H   tamsulosin   0.4 mg Oral Daily   thiamine   100 mg Oral Daily     Data Reviewed:   CBG:  Recent Labs  Lab 08/18/24 1155 08/18/24 2150 08/19/24 0618 08/19/24 0734 08/20/24 0709  GLUCAP 72 68* 68* 88 69*    SpO2: 100 % O2 Flow Rate (L/min): 4 L/min FiO2 (%): 36 %    Vitals:   08/20/24 0430 08/20/24 0500 08/20/24 0821 08/20/24 1211  BP: 131/67  (!) 155/80 (!) 126/56  Pulse: 90  100 (!) 107  Resp: (!) 22  (!) 22 20  Temp:  98.1 F (36.7 C)  98.3 F (36.8 C) (!) 97 F (36.1 C)  TempSrc: Axillary  Oral Oral  SpO2: 94%  100% 100%  Weight:  123 kg    Height:          Data Reviewed:  Basic Metabolic Panel: Recent Labs  Lab 08/14/24 0332 08/14/24 0352 08/14/24 1531 08/15/24 0331 08/16/24 0350 08/17/24 0830 08/19/24 0344 08/20/24 0918  NA  --    < > 140 141 142 142 142 141  K  --    < > 4.7 4.2 4.2 4.2 3.9 3.7  CL  --    < > 99 100 102 102 99 97*  CO2  --    < > 33* 34* 35* 37* 37* 36*  GLUCOSE  --    < > 154* 106* 92 79 74 127*  BUN  --    < > 45* 39* 22 10 9 11   CREATININE  --    < > 2.35* 1.72* 0.87 0.59* 0.57* 0.67  CALCIUM   --    < > 8.4* 8.4* 8.7* 9.0 8.6* 9.0  MG 2.0  --   --  2.0  --   --   --   --    PHOS  --   --  4.9* 3.3  --   --   --   --    < > = values in this interval not displayed.    CBC: Recent Labs  Lab 08/14/24 0352 08/14/24 1025 08/15/24 0331 08/16/24 0350 08/19/24 0344 08/20/24 0918  WBC 7.9  --  7.9 5.5 3.5* 3.3*  HGB 8.9* 9.2* 9.2* 9.4* 9.2* 10.2*  HCT 28.0* 27.0* 28.5* 29.7* 28.6* 31.1*  MCV 109.8*  --  111.3* 112.5* 110.4* 105.4*  PLT 74*  --  105* 89* 84* 91*    LFT Recent Labs  Lab 08/14/24 1531 08/15/24 0331 08/16/24 0350 08/17/24 0830  AST  --  86* 65* 41  ALT  --  21 13 <5  ALKPHOS  --  70 69 64  BILITOT  --  0.3 0.3 0.4  PROT  --  5.2* 5.4* 5.3*  ALBUMIN  3.2* 3.0* 3.0* 3.0*       CONSULTS infectious disease Cardiology (TEE)  Code Status: Full code  Family Communication: No family at bedside     Subjective   Still feel like he needs the BiPAP most of the time   Objective    Physical Examination:    General: Appearance:    Obese adult in no acute distress     Lungs:   Diminished, no wheezing, on bipap  Heart:    Tachycardic.   MS:   All extremities are intact.  Chronic skin changes- less swelling  Neurologic:   Awake, alert-hard of hearing    Harlene RAYMOND Bowl   [facility] If 7PM-7AM, please contact night-coverage at [email or web address], Office  [phone]   08/20/2024, 1:19 PM  LOS: 7 days

## 2024-08-21 LAB — BASIC METABOLIC PANEL WITH GFR
Anion gap: 7 (ref 5–15)
BUN: 12 mg/dL (ref 8–23)
CO2: 37 mmol/L — ABNORMAL HIGH (ref 22–32)
Calcium: 8.3 mg/dL — ABNORMAL LOW (ref 8.9–10.3)
Chloride: 97 mmol/L — ABNORMAL LOW (ref 98–111)
Creatinine, Ser: 0.61 mg/dL (ref 0.61–1.24)
GFR, Estimated: >60 mL/min
Glucose, Bld: 81 mg/dL (ref 70–99)
Potassium: 3.3 mmol/L — ABNORMAL LOW (ref 3.5–5.1)
Sodium: 141 mmol/L (ref 135–145)

## 2024-08-21 LAB — CBC
HCT: 27.1 % — ABNORMAL LOW (ref 39.0–52.0)
Hemoglobin: 8.7 g/dL — ABNORMAL LOW (ref 13.0–17.0)
MCH: 34.8 pg — ABNORMAL HIGH (ref 26.0–34.0)
MCHC: 32.1 g/dL (ref 30.0–36.0)
MCV: 108.4 fL — ABNORMAL HIGH (ref 80.0–100.0)
Platelets: 84 10*3/uL — ABNORMAL LOW (ref 150–400)
RBC: 2.5 MIL/uL — ABNORMAL LOW (ref 4.22–5.81)
RDW: 14 % (ref 11.5–15.5)
WBC: 3 10*3/uL — ABNORMAL LOW (ref 4.0–10.5)
nRBC: 0 % (ref 0.0–0.2)

## 2024-08-21 MED ORDER — POTASSIUM CHLORIDE CRYS ER 20 MEQ PO TBCR
40.0000 meq | EXTENDED_RELEASE_TABLET | Freq: Once | ORAL | Status: AC
  Administered 2024-08-21: 40 meq via ORAL
  Filled 2024-08-21: qty 2

## 2024-08-21 MED ORDER — FUROSEMIDE 10 MG/ML IJ SOLN
20.0000 mg | Freq: Once | INTRAMUSCULAR | Status: AC
  Administered 2024-08-21: 20 mg via INTRAVENOUS
  Filled 2024-08-21: qty 2

## 2024-08-21 MED ORDER — POTASSIUM CHLORIDE CRYS ER 10 MEQ PO TBCR
20.0000 meq | EXTENDED_RELEASE_TABLET | Freq: Once | ORAL | Status: AC
  Administered 2024-08-21: 20 meq via ORAL
  Filled 2024-08-21: qty 2

## 2024-08-21 NOTE — Plan of Care (Signed)
  Problem: Clinical Measurements: Goal: Will remain free from infection Outcome: Progressing   Problem: Clinical Measurements: Goal: Respiratory complications will improve Outcome: Progressing   Problem: Activity: Goal: Risk for activity intolerance will decrease Outcome: Progressing   Problem: Nutrition: Goal: Adequate nutrition will be maintained Outcome: Progressing   Problem: Pain Managment: Goal: General experience of comfort will improve and/or be controlled Outcome: Progressing

## 2024-08-21 NOTE — Progress Notes (Signed)
 Patient to self administer BiPap for HS. Patient is familiar with equipment and procedure.

## 2024-08-21 NOTE — Progress Notes (Signed)
 [facility]  PROGRESS NOTE  [name] FMW:[id number] DOB: 05/04/1951 DOA: 08/13/2024 PCP: [name] Aleene DEL, MD   Brief HPI:    74 y.o. adult with past medical history significant for inclusion body myositis, HTN, history of DVT on Eliquis , chronic diastolic congestive heart failure, chronic hypoxic respite failure (2-4L [place] baseline), venous insufficiency with chronic bilateral lower extremity edema, right lower extremity wounds (follows with wound care clinc), chronic hypoxic/hypercarbic respiratory failure, OSA, CAD, HLD, obesity who presented to [facility] ED on 08/13/2024 from home via EMS due to fall, apparently slid out of his bed, increased weakness.  Patient denied loss of consciousness, did not strike his head.  Recently discharged from SNF a few days prior.  Reports continue to take his prescribed occasions but has been taking a few extra doses of his torsemide  over the last few days to attempt to improve his lower extremity edema.   Significant Hospital events: 1/19: Admit TR[facility], received 3.5 L IVF, 12.5 g albumin  x 2, started on midodrine  1/20: Transferred to ICU, PCCM consulted, discontinue midodrine  due to reflex bradycardia, started on norepinephrine , started on cefazolin  for positive blood culture with MSSA, staph epi.  Infectious disease consulted 1/21: Norepinephrine  titrated off 1/22: Remains off of vasopressors, transfer to telemetry, PCCM signed off 1/23- TEE 1/25-volume overload-dosing Lasix  daily   Assessment/Plan:   Shock, hypovolemic vs septic MSSA bacteremia Staphylococcus epidermidis bacteremia Patient presenting after fall from home, apparently slid out of his bed.   Recently discharged from SNF a few days prior.  Recently admitted for septic shock secondary to lower extremity cellulitis/wounds.    Lactic acid elevated 2.2.  Blood pressure 70/35.   Apparently has been taking additional doses of his oral diuretic to help with his lower extremity edema  over the last few days.  Suspect etiology likely secondary to hypovolemia versus cardiogenic.  Patient received 3.5 L of aggressive IV fluid resuscitation, 25 g of IV albumin  and started on midodrine  without significant improvement of blood pressure.  TTE with LVEF 7975%, LV with no regional wall motion normalities, LV diastolic parameters indeterminate, LA mildly dilated, RA moderately dilated, mild dilation ascending aorta measuring 41 mm, IVC dilated in size no vegetations noted. 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  Was 0.64 on 07/20/2024.   Likely secondary to prerenal azotemia from excessive diuretic use versus ATN from hypotension.   Received aggressive IV fluid resuscitation and IV albumin  on admission. -- Cr 3.14>2.67>1.72>0.87 -- Resume atenolol  -- d/c foley catheter -- Strict I's and O's -flomax  -Now appears volume overloaded from volume resuscitation so will dose IV Lasix  daily and monitor for output -Will repeat chest x-ray-shows improvement with diuresis   Elevated LFTs  Unclear etiology, suspect mild shock liver due to hypotension as above. -Resolved so will resume statin  Delirium - Appears improved  - Delirium precautions     Peripheral vascular disease Venous insufficiency Right lower extremity wounds Sacral wound Patient follows with wound clinic outpatient, Dr. Marolyn Wound 07/10/24 Pressure Injury Buttocks Right;Left Deep Tissue Pressure Injury - Purple or maroon localized area of discolored intact skin or blood-filled blister  due to damage of underlying soft tissue from pressure and/or  shear. (Active)     Wound 08/13/24 1840 Pressure Injury Coccyx Mid Stage 2 -  Partial thickness loss of dermis presenting as a shallow open injury with a red, pink wound bed without slough. (Active)     Wound 08/13/24 1840 Pressure Injury Buttocks Right;Left;Medial Deep Tissue Pressure Injury - Purple or maroon localized area of discolored intact skin or blood-filled blister due to damage of underlying soft tissue from pressure and/or shear. (Active)     Wound 08/13/24 1841 Pressure Injury Ischial tuberosity Right;Left Deep Tissue Pressure Injury - Purple or maroon localized area of discolored intact skin or blood-filled blister due to damage of underlying soft tissue from pressure and/or shear. (Active)  -- Wound RN consulted -- ABI 1/20 abnormal with noncompressible right and left lower extremity arteries. -- Would benefit from vascular surgery follow-up outpatient   History of DVT -- Eliquis  2.5 m p.o. twice daily   CAD HLD -- Zetia  10 mg p.o. daily -- Resume Crestor    OSA -- BiPAP -- TOC consulted for home DME NIV [name] requires volume targeted ventilation via NIV/HMV for nocturnal and daytime use for CRF secondary to Obesity Hypoventilation Syndrome.  PaCO2 confirmed at 54 on ABG. Alarms and internal battery are required to ensure continuous support and immediate recognition of ventilatory insufficiency. A RAD with backup rate is insufficient due to the severity of illness. Volume ventilation is needed to reduce work of breathing and fatigue and to prevent life threatening exacerbations. OSA is not the predominant cause of hypercapnia.   BPH -- Flomax    Obesity, class II Estimated body mass index is 37.82 kg/m as calculated from the following:   Height as of this encounter: 5' 11 (1.803 m).   Weight as of this encounter: 123 kg.         DVT prophylaxis: Eliquis   Medications     apixaban   2.5 mg Oral BID   atenolol   25 mg Oral BID   Chlorhexidine  Gluconate Cloth  6  each Topical Daily   ezetimibe   10 mg Oral QPM   furosemide   20 mg Intravenous Once   liver oil-zinc  oxide  1 Application Topical BID   potassium chloride   40 mEq Oral Once   rosuvastatin   5 mg Oral Daily   sodium chloride  flush  10-40 mL Intracatheter Q12H   tamsulosin   0.4 mg Oral Daily   thiamine   100 mg Oral Daily     Data Reviewed:   CBG:  Recent Labs  Lab 08/18/24 1155 08/18/24 2150 08/19/24 0618 08/19/24 0734 08/20/24 0709  GLUCAP 72 68* 68* 88 69*    SpO2: 93 % O2 Flow Rate (L/min): 4 L/min FiO2 (%): 36 %    Vitals:   08/21/24 0643 08/21/24 0747 08/21/24 0817 08/21/24 1109  BP:  118/61  (!) 110/51  Pulse: (!) 51 64 69 62  Resp: 16 18  18   Temp:  (!) 97.5 F (36.4 C)  97.7 F (36.5 C)  TempSrc:  Oral  Oral  SpO2: 99% 94%  93%  Weight: 123 kg     Height:          Data Reviewed:  Basic Metabolic Panel: Recent Labs  Lab 08/14/24 1531 08/15/24 0331 08/16/24 0350 08/17/24 0830 08/19/24 0344 08/20/24 0918 08/21/24 0409  NA 140 141 142 142 142 141 141  K 4.7 4.2 4.2 4.2 3.9 3.7 3.3*  CL 99 100 102 102 99 97* 97*  CO2 33* 34* 35* 37* 37* 36* 37*  GLUCOSE 154* 106* 92 79 74 127* 81  BUN 45* 39* 22 10 9 11 12   CREATININE 2.35* 1.72* 0.87 0.59* 0.57* 0.67 0.61  CALCIUM  8.4* 8.4* 8.7* 9.0 8.6* 9.0 8.3*  MG  --  2.0  --   --   --   --   --   PHOS 4.9* 3.3  --   --   --   --   --     CBC: Recent Labs  Lab 08/15/24 0331 08/16/24 0350 08/19/24 0344 08/20/24 0918 08/21/24 0409  WBC 7.9 5.5 3.5* 3.3* 3.0*  HGB 9.2* 9.4* 9.2* 10.2* 8.7*  HCT 28.5* 29.7* 28.6* 31.1* 27.1*  MCV 111.3* 112.5* 110.4* 105.4* 108.4*  PLT 105* 89* 84* 91* 84*    LFT Recent Labs  Lab 08/14/24 1531 08/15/24 0331 08/16/24 0350 08/17/24 0830  AST  --  86* 65* 41  ALT  --  21 13 <5  ALKPHOS  --  70 69 64  BILITOT  --  0.3 0.3 0.4  PROT  --  5.2* 5.4* 5.3*  ALBUMIN  3.2* 3.0* 3.0* 3.0*       CONSULTS infectious disease Cardiology (TEE)  Code Status: Full  code  Family Communication: No family at bedside     Subjective   Thinks the diuresis has helped his breathing   Objective    Physical Examination:   General: Appearance:    Obese adult in no acute distress-on BiPAP     Lungs:   Crackles at bases, on BiPAP, appears comfortable  Heart:    Normal heart rate.   MS:   All extremities are intact.   Neurologic:   Awake, alert      Harlene RAYMOND Bowl   [facility] If 7PM-7AM, please contact night-coverage at [email or web address], Office  [phone]   08/21/2024, 11:28 AM  LOS: 8 days

## 2024-08-21 NOTE — TOC Progression Note (Signed)
 Transition of Care (TOC) - Progression Note  Rayfield Gobble RN, BSN Inpatient Care Management Unit 4E- RN Case Manager See Treatment Team for direct phone #   Patient Details  Name: [name] MRN: [id number] Date of Birth: November 03, 1950  Transition of Care [facility]) CM/SW Contact  Gobble Rayfield Hurst, RN Phone Number: 08/21/2024, 3:08 PM  Clinical Narrative:    Follow up done with pt and TC to wife regarding DME needs- confirmed pt has BSC, motorized w/c, walkers, adjustable bed.   Pt/wife have declined hospital bed at this time, note hoyer lift was ordered when pt was at [facility] through [name]. Per wife lift has not been delivered- CM will follow up with [employer] liaison.  Wife declines any further DME needs.   Transportation home address as well- per wife they will plan to transport via car, unless roads are still icy at time of discharge- then may need EMS to transport.   Call made to [employer] liaison to follow up on hoyer lift- liaison to have DME delivered this week.   [facility] liaison also reached out this am- and confirmed they are following for d/c and will deliver NIV at that time, with plan for their RT to proved education.    Expected Discharge Plan: Home w Home Health Services Barriers to Discharge: Continued Medical Work up (Pt scheduled for TEE on 1/23)               Expected Discharge Plan and Services In-house Referral: NA Discharge Planning Services: CM Consult Post Acute Care Choice: Durable Medical Equipment, Home Health Living arrangements for the past 2 months: Single Family Home                 DME Arranged: NIV DME Agency: Other - Comment ([facility]) Date DME Agency Contacted: 08/15/24 Time DME Agency Contacted: 1130 Representative spoke with at DME Agency: Norene Ligas- rep with [facility] [phone] HH Arranged: RN, IV Antibiotics, PT, OT HH Agency: [facility] Date HH Agency Contacted: 08/19/24   Representative spoke with at [facility] Agency:  [facility]   Social Drivers of Health (SDOH) Interventions SDOH Screenings   Food Insecurity: No Food Insecurity (08/13/2024)  Housing: Low Risk (08/13/2024)  Transportation Needs: No Transportation Needs (08/13/2024)  Utilities: Not At Risk (08/13/2024)  Alcohol Screen: Low Risk (06/05/2024)  Depression (PHQ2-9): Low Risk (12/28/2023)  Financial Resource Strain: Low Risk (06/05/2024)  Physical Activity: Inactive (06/05/2024)  Social Connections: Socially Isolated (08/13/2024)  Stress: No Stress Concern Present (06/05/2024)  Tobacco Use: Medium Risk (08/13/2024)  Health Literacy: Adequate Health Literacy (12/28/2023)    Readmission Risk Interventions    08/16/2024   11:30 AM  Readmission Risk Prevention Plan  Transportation Screening Complete  PCP or Specialist Appt within 5-7 Days Complete  Home Care Screening Complete  Medication Review (RN CM) Complete

## 2024-08-21 NOTE — Progress Notes (Signed)
 Physical Therapy Treatment Patient Details Name: [name] MRN: [id number] DOB: 1951/04/23 Today's Date: 08/21/2024   History of Present Illness 74 yo M Presents to ED 08/13/24 after slid out of bed, DC from rehab 2 days prior, mostly bedbound. Patient hypotensive, chronic hypoxia and hypercarbic respiratory failure.    PMHx: CAD, HTN, HLD, inclusion body myositis, DVT, chonic LB edema, paralyzed Rt hemidiaphragm    PT Comments  Pt resting in bed on arrival, pleasant and agreeable to session. Pt demonstrating steady progress towards acute goals this session. Pt standing from slightly elevated EOB x3 during session with min A to steady on rise to RW. Pt able to take side steps toward [facility] with min A to manage RW and lines/leads and step pivot over to chair at end of session with light min A. Pt agreeable to time up in chair at end of session and receptive to education on importance and benefits of upright sitting for cardiopulmonary compliance. Pt continues to benefit from skilled PT services to progress toward functional mobility goals.     If plan is discharge home, recommend the following: Two people to help with walking and/or transfers;A lot of help with bathing/dressing/bathroom;Assist for transportation   Can travel by private vehicle        Equipment Recommendations  Hospital bed;BSC/3in1 (air mattress; bari)    Recommendations for Other Services       Precautions / Restrictions Precautions Precautions: Fall Recall of Precautions/Restrictions: Intact Precaution/Restrictions Comments: monitor sats/HR Restrictions Weight Bearing Restrictions Per Provider Order: No     Mobility  Bed Mobility Overal bed mobility: Needs Assistance Bed Mobility: Supine to Sit     Supine to sit: Min assist     General bed mobility comments: light min A to scoot out to EOB due to pt sticking to air mattress    Transfers Overall transfer level: Needs assistance Equipment used: Rolling walker  (2 wheels) Transfers: Sit to/from Stand, Bed to chair/wheelchair/BSC Sit to Stand: Min assist, From elevated surface   Step pivot transfers: Min assist       General transfer comment: light min A to stand from slightly elevated EOB, pt with good recall for hand placement, min A to steady to step over to chair    Ambulation/Gait Ambulation/Gait assistance: Min assist Gait Distance (Feet): 3 Feet Assistive device: Rolling walker (2 wheels) Gait Pattern/deviations: Step-to pattern       General Gait Details: pt able to take some side steps along EOB to [facility]   Stairs             Wheelchair Mobility     Tilt Bed    Modified Rankin (Stroke Patients Only)       Balance Overall balance assessment: Needs assistance, History of Falls Sitting-balance support: Bilateral upper extremity supported, Feet supported Sitting balance-Leahy Scale: Fair     Standing balance support: During functional activity, Reliant on assistive device for balance, Bilateral upper extremity supported Standing balance-Leahy Scale: Poor Standing balance comment: reliant on RW support                            Communication Communication Communication: Impaired Factors Affecting Communication: Hearing impaired  Cognition Arousal: Alert Behavior During Therapy: WFL for tasks assessed/performed   PT - Cognitive impairments: Safety/Judgement                         Following commands: Intact  Cueing Cueing Techniques: Verbal cues, Tactile cues, Visual cues  Exercises      General Comments General comments (skin integrity, edema, etc.): VSS on supplemental O2      Pertinent Vitals/Pain Pain Assessment Pain Assessment: Faces Faces Pain Scale: Hurts a little bit Pain Location: generalized Pain Descriptors / Indicators: Aching, Discomfort, Guarding Pain Intervention(s): Monitored during session, Limited activity within patient's tolerance    Home Living                           Prior Function            PT Goals (current goals can now be found in the care plan section) Acute Rehab PT Goals Patient Stated Goal: go home, my wife and children will help PT Goal Formulation: With patient Time For Goal Achievement: 08/30/24 Progress towards PT goals: Progressing toward goals    Frequency    Min 2X/week      PT Plan      Co-evaluation              AM-PAC PT 6 Clicks Mobility   Outcome Measure  Help needed turning from your back to your side while in a flat bed without using bedrails?: A Lot Help needed moving from lying on your back to sitting on the side of a flat bed without using bedrails?: A Lot Help needed moving to and from a bed to a chair (including a wheelchair)?: A Lot Help needed standing up from a chair using your arms (e.g., wheelchair or bedside chair)?: A Little Help needed to walk in hospital room?: Total Help needed climbing 3-5 steps with a railing? : Total 6 Click Score: 11    End of Session Equipment Utilized During Treatment: Oxygen Activity Tolerance: Patient tolerated treatment well;Patient limited by fatigue Patient left: with call bell/phone within reach;with nursing/sitter in room;in chair Nurse Communication: Mobility status PT Visit Diagnosis: Unsteadiness on feet (R26.81);Other abnormalities of gait and mobility (R26.89);Repeated falls (R29.6);History of falling (Z91.81);Muscle weakness (generalized) (M62.81);Difficulty in walking, not elsewhere classified (R26.2);Pain Pain - Right/Left: Right Pain - part of body: Arm     Time: 8541-8473 PT Time Calculation (min) (ACUTE ONLY): 28 min  Charges:    $Therapeutic Activity: 23-37 mins PT General Charges $$ ACUTE PT VISIT: 1 Visit                     Elim Peale R. PTA Acute Rehabilitation Services Office: [phone]   Therisa CHRISTELLA Boor 08/21/2024, 4:17 PM

## 2024-08-22 ENCOUNTER — Other Ambulatory Visit (HOSPITAL_COMMUNITY): Payer: Self-pay

## 2024-08-22 MED ORDER — ALBUTEROL SULFATE (2.5 MG/3ML) 0.083% IN NEBU
2.5000 mg | INHALATION_SOLUTION | RESPIRATORY_TRACT | 12 refills | Status: AC | PRN
  Filled 2024-08-22: qty 90, 5d supply, fill #0

## 2024-08-22 MED ORDER — CEFAZOLIN SODIUM-DEXTROSE 2-4 GM/100ML-% IV SOLN
2.0000 g | Freq: Three times a day (TID) | INTRAVENOUS | Status: DC
  Administered 2024-08-22: 2 g via INTRAVENOUS
  Filled 2024-08-22: qty 100

## 2024-08-22 MED ORDER — FUROSEMIDE 40 MG PO TABS
40.0000 mg | ORAL_TABLET | ORAL | 0 refills | Status: AC
  Filled 2024-08-22: qty 45, 90d supply, fill #0

## 2024-08-22 MED ORDER — TRAZODONE HCL 50 MG PO TABS
25.0000 mg | ORAL_TABLET | Freq: Every evening | ORAL | 0 refills | Status: AC | PRN
  Filled 2024-08-22: qty 45, 90d supply, fill #0

## 2024-08-22 MED ORDER — ZINC OXIDE 40 % EX OINT
1.0000 | TOPICAL_OINTMENT | Freq: Two times a day (BID) | CUTANEOUS | 0 refills | Status: AC
  Filled 2024-08-22: qty 56.7, 28d supply, fill #0

## 2024-08-22 MED ORDER — HEPARIN SOD (PORK) LOCK FLUSH 100 UNIT/ML IV SOLN
250.0000 [IU] | INTRAVENOUS | Status: AC | PRN
  Administered 2024-08-22: 250 [IU]

## 2024-08-22 MED ORDER — CEFAZOLIN SODIUM-DEXTROSE 2-4 GM/100ML-% IV SOLN: 2.0000 g | Freq: Three times a day (TID) | INTRAVENOUS | Status: DC

## 2024-08-22 MED ORDER — THIAMINE HCL 100 MG PO TABS
100.0000 mg | ORAL_TABLET | Freq: Every day | ORAL | 0 refills | Status: AC
  Filled 2024-08-22: qty 90, 90d supply, fill #0

## 2024-08-22 MED ORDER — ATENOLOL 25 MG PO TABS
25.0000 mg | ORAL_TABLET | Freq: Two times a day (BID) | ORAL | 0 refills | Status: AC
  Filled 2024-08-22: qty 90, 45d supply, fill #0

## 2024-08-22 MED ORDER — GUAIFENESIN 100 MG/5ML PO LIQD
5.0000 mL | ORAL | 0 refills | Status: AC | PRN
  Filled 2024-08-22: qty 118, 4d supply, fill #0

## 2024-08-22 MED ORDER — TAMSULOSIN HCL 0.4 MG PO CAPS
0.4000 mg | ORAL_CAPSULE | Freq: Every evening | ORAL | 0 refills | Status: AC
  Filled 2024-08-22: qty 90, 90d supply, fill #0

## 2024-08-22 NOTE — TOC Transition Note (Addendum)
 Transition of Care (TOC) - Discharge Note Rayfield Gobble RN, BSN Inpatient Care Management Unit 4E- RN Case Manager See Treatment Team for direct phone #   Patient Details  Name: [name] MRN: [id number] Date of Birth: 03-26-1951  Transition of Care [facility]) CM/SW Contact:  Gobble Rayfield Hurst, RN Phone Number: 08/22/2024, 12:51 PM   Clinical Narrative:    Pt stable for transition home today per MD.  CM has confirmed arrangements for home IV abx, HH, NIV and DME-hoyer lift.   Pam with home infusion will be meeting pt and family at the beside for IV abx education at 2-2:30 this afternoon.   [facility] following for [facility] needs- and liaison confirmed they will plan a nursing visit for tomorrow- 1/29.   Msg sent to [facility] for hoyer lift deliver.   CM also spoke with [facility] liaison for NIV delivery. [facility] RT has already spoken with wife with regards to NIV delivery today and will provide education.   Call made to wife- she confirmed they would be here at 2pm for education on IV abx, reviewed other arrangements for HH, NIV and hoyer lift. Wife voiced understanding.  Confirmed with wife that pt was not on home 02 PTA- will have bedside RN check 02 on room air to see if home 02 is needed.  Wife also confirmed that family is still planning on transporting pt home after education- declining EMS transport at this time.   Msg sent to bedside RN for home 02 check on RA. Update per bedside RN pt remains 99-100% on RA at rest.   IP CM interventions have been completed no further needs noted.  1430- spoke with wife and SIL at bedside- they have decided it would be best to transport via EMS- currently waiting for Pam to come and provide bedside education for home IV abx, pt will also need 4pm dose given prior to discharge.   1600- education has been completed and 4pm dose of abx currently running-  [facility] called for transport- per dispatch ETA for pickup is 1.5-2hrs- bedside RN updated.     Final next level of care: Home w Home Health Services Barriers to Discharge: Barriers Resolved   Patient Goals and CMS Choice Patient states their goals for this hospitalization and ongoing recovery are:: Home CMS Medicare.gov Compare Post Acute Care list provided to:: Patient Choice offered to / list presented to : Patient, Spouse Wilcox ownership interest in [facility].provided to:: Patient    Discharge Placement                 Home w/ [facility]      Discharge Plan and Services Additional resources added to the After Visit Summary for   In-house Referral: NA Discharge Planning Services: CM Consult Post Acute Care Choice: Durable Medical Equipment, Home Health          DME Arranged: NIV, Other see comment (hoyer lift) DME Agency: [employer] Date DME Agency Contacted: 08/15/24 Time DME Agency Contacted: 1130 Representative spoke with at DME Agency: Norene Ligas- rep with [facility] [phone] HH Arranged: RN, IV Antibiotics, PT, OT HH Agency: [facility] Date [facility] Agency Contacted: 08/19/24   Representative spoke with at [facility] Agency: Pam  Social Drivers of Health (SDOH) Interventions SDOH Screenings   Food Insecurity: No Food Insecurity (08/13/2024)  Housing: Low Risk (08/13/2024)  Transportation Needs: No Transportation Needs (08/13/2024)  Utilities: Not At Risk (08/13/2024)  Alcohol Screen: Low Risk (06/05/2024)  Depression (PHQ2-9): Low Risk (12/28/2023)  Financial Resource Strain: Low Risk (06/05/2024)  Physical Activity: Inactive (06/05/2024)  Social Connections: Socially Isolated (08/13/2024)  Stress: No Stress Concern Present (06/05/2024)  Tobacco Use: Medium Risk (08/13/2024)  Health Literacy: Adequate Health Literacy (12/28/2023)     Readmission Risk Interventions    08/22/2024   12:31 PM 08/16/2024   11:30 AM  Readmission Risk Prevention Plan  Transportation Screening  Complete  PCP or Specialist  Appt within 5-7 Days  Complete  Home Care Screening  Complete  Medication Review (RN CM)  Complete  HRI or Home Care Consult Complete   Social Work Consult for Recovery Care Planning/Counseling Complete   Palliative Care Screening Not Applicable   Medication Review Oceanographer) Complete

## 2024-08-22 NOTE — Care Management Important Message (Signed)
 Important Message  Patient Details  Name: Nuh Lipton MRN: [id number] Date of Birth: 1951/02/16   Important Message Given:  Yes - Medicare IM     [name] 08/22/2024, 12:44 PM

## 2024-08-22 NOTE — Discharge Summary (Signed)
 " Physician Discharge Summary   Patient: [name] MRN: [id number] DOB: 14-May-1951  Admit date:     08/13/2024  Discharge date: 08/22/24  Discharge Physician: Lonni KANDICE Moose   PCP: Candise Aleene DEL, MD   Recommendations at discharge:   Follow-up with PCP in the next 1 to 2 weeks. Follow-up with pulmonology as previously scheduled. Follow-up with infectious disease as scheduled. Follow-up with cardiology as scheduled.  Discharge Diagnoses: Principal Problem:   AKI (acute kidney injury) Active Problems:   Sepsis due to methicillin resistant Staphylococcus aureus (MRSA) with acute renal failure and septic shock (HCC)   Acute bacterial endocarditis   Pressure injury of skin  Resolved Problems:   * No resolved hospital problems. *  Hospital Course: Significant Hospital events: 1/19: Admit [facility], received 3.5 L IVF, 12.5 g albumin  x 2, started on midodrine  1/20: Transferred to ICU, PCCM consulted, discontinue midodrine  due to reflex bradycardia, started on norepinephrine , started on cefazolin  for positive blood culture with MSSA, staph epi.  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  He was found to be in septic shock.  He was transferred to the ICU.  Required pressors.  Started on broad-spectrum antibiotics.  He developed AKI secondary to sepsis.   Creatinine has returned towards baseline.  Admission blood cultures grew MSSA as well as Staphylococcus epidermidis.  ID was consulted.  Source of infection was felt to likely be from his chronic sacral wounds.  TEE was performed without vegetations.  Repeat blood cultures remain sterile.  Staph epidermidis was felt to be a skin contaminant.  PICC line placement on 125 after blood cultures on the 21st remained negative for 96 hours.  He will require a 2-week course of IV Ancef  via PICC line and is right upper extremity which will complete on August 28, 2024.  In addition he had difficulty with anasarca and required diuresis.  He required a volume targeted ventilator to use at night due to his severe obstructive sleep apnea.  The time of discharge he is afebrile hemodynamically stable and clinically improved.  He remains with all of his comorbid conditions.  Stable for discharge home on the home ventilator nightly with home health for wound care and monitoring of antibiotic therapy and the PICC line.  Brief HPI:     74 y.o. adult with past medical history significant for inclusion body myositis, HTN, history of DVT on Eliquis , chronic diastolic congestive heart failure, chronic hypoxic respite failure (2-4L [place] baseline), venous insufficiency with chronic bilateral lower extremity edema, right lower extremity wounds (follows with wound care clinc), chronic hypoxic/hypercarbic respiratory failure, OSA, CAD, HLD, obesity who presented to [facility] ED on 08/13/2024 from home via EMS due to fall, apparently slid out of his bed, increased weakness.  Patient denied loss of consciousness, did not strike his head.  Recently discharged from SNF a few days prior.  Reports continue to take his prescribed occasions but has been taking a few extra doses  of his torsemide  over the last few days to attempt to improve his lower extremity edema.       Assessment/Plan:    Shock, hypovolemic vs septic MSSA bacteremia  likely secondary to sacral wounds Staphylococcus epidermidis bacteremia Patient presenting after fall from home, apparently slid out of his bed.   Recently discharged from SNF a few days prior.  Recently admitted for septic shock secondary to lower extremity cellulitis/wounds.    Lactic acid elevated 2.2.  Blood pressure 70/35.   Apparently has been taking additional doses of his oral diuretic to help with his lower extremity edema over the last few days.  Suspect etiology likely secondary to hypovolemia versus cardiogenic.  Patient received 3.5 L of aggressive IV fluid resuscitation, 25 g of IV albumin  and started on midodrine  without significant improvement of blood pressure.  TTE with LVEF 7975%, LV with no regional wall motion normalities, LV diastolic parameters indeterminate, LA mildly dilated, RA moderately dilated, mild dilation ascending aorta measuring 41 mm, IVC dilated in size no vegetations noted. 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  Was 0.64 on 07/20/2024.   Likely secondary to prerenal azotemia from excessive diuretic use versus ATN from hypotension.   Received aggressive IV fluid resuscitation and IV albumin  on admission. -- Cr 3.14>2.67>1.72>0.87 -- Resume atenolol  -- d/c foley catheter -- Strict I's and O's -flomax  -Now appears volume overloaded from volume resuscitation so will dose IV Lasix  daily and monitor for output -Will repeat chest x-ray-shows improvement with diuresis   Elevated LFTs  Unclear etiology, suspect mild shock liver due to hypotension as above. -Resolved so will resume statin    Delirium - Appears improved  - Delirium precautions     Peripheral vascular disease Venous insufficiency Right lower extremity wounds Sacral wound Patient follows with wound clinic outpatient, Dr. Marolyn Wound 07/10/24 Pressure Injury Buttocks Right;Left Deep Tissue Pressure Injury - Purple or maroon localized area of discolored intact skin or blood-filled blister due to damage of underlying soft tissue from pressure and/or shear. (Active)     Wound 08/13/24 1840 Pressure Injury Coccyx Mid Stage 2 -  Partial thickness loss of dermis presenting as a shallow open injury with a red, pink wound bed without slough. (Active)     Wound 08/13/24 1840 Pressure Injury Buttocks Right;Left;Medial Deep Tissue Pressure Injury - Purple or maroon localized area of discolored intact skin or blood-filled blister due to damage of underlying soft tissue from pressure and/or shear. (Active)     Wound 08/13/24 1841 Pressure Injury Ischial tuberosity Right;Left Deep Tissue Pressure Injury - Purple or maroon localized area of discolored intact skin or blood-filled blister due to damage of underlying soft tissue from pressure and/or shear. (Active)  -- Wound RN consulted -- ABI 1/20 abnormal with noncompressible right and left lower extremity arteries. -- Would benefit from vascular surgery follow-up outpatient   History of DVT -- Eliquis  2.5 m p.o. twice daily   CAD HLD -- Zetia  10 mg p.o. daily -- Resume Crestor    OSA -- BiPAP -- TOC consulted for home DME NIV Mr Sellick requires volume targeted ventilation via  NIV/HMV for nocturnal and daytime use for CRF secondary to Obesity Hypoventilation Syndrome.  PaCO2 confirmed at 54 on ABG. Alarms and internal battery are required to ensure continuous support and immediate recognition of ventilatory insufficiency. A RAD with backup rate is insufficient due to the severity of illness. Volume ventilation is needed to reduce work of breathing and fatigue and to prevent  life threatening exacerbations. OSA is not the predominant cause of hypercapnia.   BPH -- Flomax    Obesity, class II Estimated body mass index is 37.82 kg/m as calculated from the following:   Height as of this encounter: 5' 11 (1.803 m).   Weight as of this encounter: 123 kg.              DVT prophylaxis: Eliquis            Consultants: Cardiology, intensive care, infectious disease Procedures performed: PICC line placement Disposition: Home with home health Diet recommendation:  Discharge Diet Orders (From admission, onward)     Start     Ordered   08/22/24 0000  Diet - low sodium heart healthy        08/22/24 1120            DISCHARGE MEDICATION: Allergies as of 08/22/2024   No Known Allergies      Medication List     STOP taking these medications    Eszopiclone  3 MG Tabs   feeding supplement Liqd   lisinopril  10 MG tablet Commonly known as: ZESTRIL    nutrition supplement (JUVEN) Pack   polyethylene glycol 17 g packet Commonly known as: MIRALAX  / GLYCOLAX    torsemide  20 MG tablet Commonly known as: DEMADEX        TAKE these medications    acetaminophen  325 MG tablet Commonly known as: TYLENOL  Take 2 tablets (650 mg total) by mouth every 6 (six) hours as needed (as needed for pain).   albuterol  (2.5 MG/3ML) 0.083% nebulizer solution Commonly known as: PROVENTIL  Take 3 mLs (2.5 mg total) by nebulization every 4 (four) hours as needed for wheezing.   atenolol  25 MG tablet Commonly known as: TENORMIN  Take 1 tablet (25 mg total) by mouth 2 (two) times daily. What changed: Another medication with the same name was removed. Continue taking this medication, and follow the directions you see here.   b complex vitamins tablet Take 1 tablet by mouth every evening.   ceFAZolin  IVPB Commonly known as: ANCEF  Inject 2 g into the vein every 8 (eight) hours for 8 days. Indication:  MSSA bacteremia  First Dose: Yes Last Day of Therapy:   08/28/2024 Labs - Once weekly:  CBC/D and BMP, Labs - Once weekly: ESR and CRP Method of administration: IV Push Method of administration may be changed at the discretion of home infusion pharmacist based upon assessment of the patient and/or caregiver's ability to self-administer the medication ordered.   COQ-10 PO Take 1 capsule by mouth every evening.   Eliquis  2.5 MG Tabs tablet Generic drug: apixaban  Take 1 tablet (2.5 mg total) by mouth 2 (two) times daily.   ezetimibe  10 MG tablet Commonly known as: ZETIA  Take 1 tablet (10 mg total) by mouth daily. What changed: when to take this   fluticasone  50 MCG/ACT nasal spray Commonly known as: FLONASE  Place 1 spray into both nostrils 2 (two) times daily as needed for allergies or rhinitis.   folic acid  400 MCG tablet Commonly known as: FOLVITE  Take 400-800 mcg by mouth daily.   furosemide  40 MG tablet Commonly  known as: LASIX  Take 1 tablet (40 mg total) by mouth See admin instructions. Take 40 mg by mouth every other morning   guaiFENesin  100 MG/5ML liquid Commonly known as: ROBITUSSIN Take 5 mLs by mouth every 4 (four) hours as needed for cough or to loosen phlegm.   KRILL OIL PO Take 1 capsule by mouth daily.   levocetirizine 5 MG tablet Commonly known as: XYZAL  Take 5 mg by mouth See admin instructions. Take 5 mg by mouth in the evening as needed for allergies   liver oil-zinc  oxide 40 % ointment Commonly known as: DESITIN Apply 1 Application topically 2 (two) times daily.   multivitamin with minerals Tabs tablet Take 1 tablet by mouth daily.   potassium chloride  10 MEQ tablet Commonly known as: KLOR-CON  Take 10 mEq by mouth every other day.   rosuvastatin  5 MG tablet Commonly known as: CRESTOR  Take 5 mg by mouth daily.   tamsulosin  0.4 MG Caps capsule Commonly known as: FLOMAX  Take 1 capsule (0.4 mg total) by mouth every evening.   thiamine  100 MG tablet Commonly known as: Vitamin B-1 Take 1 tablet (100  mg total) by mouth daily. Start taking on: August 23, 2024   traZODone  50 MG tablet Commonly known as: DESYREL  Take 0.5 tablets (25 mg total) by mouth at bedtime as needed for sleep.   VITAMIN D-3 PO Take 1 capsule by mouth daily.               Durable Medical Equipment  (From admission, onward)           Start     Ordered   08/15/24 1109  For home use only DME Other see comment  Once       Comments: Refer for evaluation regarding home BiPAP versus NIV device  Question:  Length of Need  Answer:  Lifetime   08/15/24 1108              Discharge Care Instructions  (From admission, onward)           Start     Ordered   08/22/24 0000  Change dressing (specify)       Comments: Dressing change:  as above   08/22/24 1120   08/20/24 0000  Change dressing on IV access line weekly and PRN  (Home infusion instructions - [facility] )        08/20/24 1117            Contact information for follow-up providers     [facility] [place] (DME) [facility] Follow up.   Specialty: DME Services Why: referral for home IV abx needs- liaison will contact you and coordinate education and home delivery Contact information: [address] [place]  [phone]        [facility] Follow up.   Why: home NIV arranged- they will deliver and provide education at time of discharge Contact information: may contact for any questions/needs: Damien RRT [phone]             Contact information for after-discharge care     Durable Medical Equipment     [facility] (DME) Follow up.   Service: Durable Medical Equipment Why: Deitra lift arranged- they will deliver to the home Contact information: [address] [place]  [address] [phone]             Home Medical Care     [facility]  [place] [facility]) .   Service: Home Health Services Why:  HHRN/PT/OT arranged- they will contact you to schedule Contact information: [address] [place] [address] [place]  [address] [phone]                    Discharge Exam: Fredricka Weights   08/20/24 0500 08/21/24 0643 08/22/24 0352  Weight: 123 kg 123 kg 120 kg   He is awake, alert, conversant, nontoxic HEENT: PERRLA Cabbell EOMI, no scleral icterus oropharynx moist without exudate CV: Regular rate and rhythm S1-S2 no murmurs rubs gallops Lungs: Clear in the anterior lateral lung fields Abdomen: Obese but soft nontender nondistended Extremities: No cyanosis clubbing, +1 edema Neurologic: Awake alert coherent, moving all extremities spontaneously.  Condition at discharge: stable  The results of significant diagnostics from this hospitalization (including imaging, microbiology, ancillary and laboratory) are listed below for reference.   Imaging Studies: DG CHEST PORT 1 VIEW Result Date: 08/20/2024 CLINICAL DATA:  Hypoxia. EXAM: PORTABLE CHEST 1 VIEW COMPARISON:  08/18/2024 and CT chest 07/12/2024. FINDINGS: Trachea is midline. Heart is enlarged. Thoracic aorta is calcified. Lungs are low in volume with bibasilar airspace opacification, slightly improved from 08/18/2024. Bibasilar atelectasis. Difficult to exclude small bilateral pleural effusions. Elevated right hemidiaphragm. IMPRESSION: 1. Low lung volumes with an elevated right hemidiaphragm. Associated bibasilar atelectasis, improved from 08/18/2024. 2. Difficult to exclude small bilateral pleural effusions. Electronically Signed   By: Newell Eke M.D.   On: 08/20/2024 14:11   US  EKG SITE RITE Result Date: 08/19/2024 If Site Rite image not attached, placement could not be confirmed due to current cardiac rhythm.  DG CHEST PORT 1 VIEW Result Date: 08/18/2024 CLINICAL DATA:  Aspiration into airway. EXAM: PORTABLE CHEST 1 VIEW COMPARISON:  08/14/2024, 07/12/2024. FINDINGS: The heart is enlarged and  the mediastinal contour stable. There is atherosclerotic calcification of the aorta. Lung volumes are low. Interstitial prominence is noted bilaterally with airspace disease at the lung bases. There are likely small bilateral pleural effusions. No pneumothorax is seen. Cervical spinal fusion hardware is noted. IMPRESSION: 1. Increased interstitial prominence with airspace disease at the lung bases, possible edema or infiltrate. 2. Suspected small bilateral pleural effusions. Electronically Signed   By: Leita Birmingham M.D.   On: 08/18/2024 16:47   ECHO TEE Result Date: 08/17/2024    TRANSESOPHOGEAL ECHO REPORT   Patient Name:   DIONDRE PULIS Date of Exam: 08/17/2024 Medical Rec #:  [id number]   Height:       71.0 in Accession #:    [id number]  Weight:       272.3 lb Date of Birth:  Apr 19, 1951   BSA:          2.404 m Patient Age:    74 years    BP:           140/89 mmHg Patient Gender: M           HR:           93 bpm. Exam Location:  Inpatient Procedure: Transesophageal Echo, Cardiac Doppler, Color Doppler and Saline            Contrast Bubble Study (Both Spectral and Color Flow Doppler were            utilized during procedure). Indications:    Bacteremia  History:        Patient has prior history of Echocardiogram examinations.  Sonographer:    Jayson Gaskins Referring Phys: [id number] ZANE ADAMS PROCEDURE:  The transesophogeal probe was passed without difficulty through the esophogus of the patient. Sedation performed by different physician. The patient developed no complications during the procedure.  IMPRESSIONS  1. Left ventricular ejection fraction, by estimation, is 60 to 65%. The left ventricle has normal function. The left ventricle has no regional wall motion abnormalities.  2. Right ventricular systolic function is normal. The right ventricular size is mildly enlarged. Tricuspid regurgitation signal is inadequate for assessing PA pressure.  3. Left atrial size was mildly dilated. No left atrial/left atrial  appendage thrombus was detected.  4. Right atrial size was moderately dilated.  5. The mitral valve is normal in structure. No evidence of mitral valve regurgitation. No evidence of mitral stenosis.  6. Tricuspid valve regurgitation is mild to moderate.  7. The aortic valve is tricuspid. There is mild calcification of the aortic valve. Aortic valve regurgitation is mild. Aortic valve sclerosis is present, with no evidence of aortic valve stenosis.  8. Aortic dilatation noted. There is mild dilatation of the aortic root, measuring 42 mm.  9. Agitated saline contrast bubble study was negative, with no evidence of any interatrial shunt. Conclusion(s)/Recommendation(s): No evidence of vegetation/infective endocarditis on this transesophageael echocardiogram. FINDINGS  Left Ventricle: Left ventricular ejection fraction, by estimation, is 60 to 65%. The left ventricle has normal function. The left ventricle has no regional wall motion abnormalities. The left ventricular internal cavity size was normal in size. There is  no left ventricular hypertrophy. Right Ventricle: The right ventricular size is mildly enlarged. No increase in right ventricular wall thickness. Right ventricular systolic function is normal. Tricuspid regurgitation signal is inadequate for assessing PA pressure. Left Atrium: Left atrial size was mildly dilated. No left atrial/left atrial appendage thrombus was detected. Right Atrium: Right atrial size was moderately dilated. Pericardium: There is no evidence of pericardial effusion. Mitral Valve: The mitral valve is normal in structure. No evidence of mitral valve regurgitation. No evidence of mitral valve stenosis. Tricuspid Valve: The tricuspid valve is normal in structure. Tricuspid valve regurgitation is mild to moderate. Aortic Valve: The aortic valve is tricuspid. There is mild calcification of the aortic valve. Aortic valve regurgitation is mild. Aortic valve sclerosis is present, with no evidence  of aortic valve stenosis. Pulmonic Valve: The pulmonic valve was grossly normal. Pulmonic valve regurgitation is mild to moderate. No evidence of pulmonic stenosis. Aorta: Aortic dilatation noted. There is mild dilatation of the aortic root, measuring 42 mm. IAS/Shunts: There is left bowing of the interatrial septum, suggestive of elevated right atrial pressure. There is redundancy of the interatrial septum. No atrial level shunt detected by color flow Doppler. Agitated saline contrast was given intravenously to evaluate for intracardiac shunting. Agitated saline contrast bubble study was negative, with no evidence of any interatrial shunt.   AORTA Ao Root diam: 4.23 cm Ao Asc diam:  3.24 cm Jerel Balding MD Electronically signed by Jerel Balding MD Signature Date/Time: 08/17/2024/2:49:15 PM    Final    EP STUDY Result Date: 08/17/2024 See surgical note for result.  ECHOCARDIOGRAM COMPLETE Result Date: 08/14/2024    ECHOCARDIOGRAM REPORT   Patient Name:   DETRELL UMSCHEID Date of Exam: 08/14/2024 Medical Rec #:  [id number]   Height:       71.0 in Accession #:    [id number]  Weight:       272.3 lb Date of Birth:  1950-08-30   BSA:          2.404 m Patient Age:  74 years    BP:           92/33 mmHg Patient Gender: M           HR:           70 bpm. Exam Location:  Inpatient Procedure: 2D Echo, Cardiac Doppler, Color Doppler and Intracardiac            Opacification Agent (Both Spectral and Color Flow Doppler were            utilized during procedure). Indications:    Shock  History:        Patient has prior history of Echocardiogram examinations, most                 recent 07/10/2024. CHF, CAD, Abnormal ECG, Arrythmias:Atrial                 Fibrillation, Signs/Symptoms:Bacteremia; Risk                 Factors:Hypertension.  Sonographer:    Ellouise Mose RDCS Referring Phys: [id number] ERIC J AUSTRIA  Sonographer Comments: Technically difficult study due to poor echo windows, suboptimal parasternal window, suboptimal  apical window and suboptimal subcostal window. Study was originally ordered as limited, then MD changed to complete with definity . IMPRESSIONS  1. Left ventricular ejection fraction, by estimation, is 70 to 75%. The left ventricle has hyperdynamic function. The left ventricle has no regional wall motion abnormalities. Left ventricular diastolic parameters are indeterminate.  2. Right ventricular systolic function is moderately reduced. The right ventricular size is severely enlarged. Mildly increased right ventricular wall thickness. There is severely elevated pulmonary artery systolic pressure. The estimated right ventricular systolic pressure is 66.0 mmHg.  3. Left atrial size was mildly dilated.  4. Right atrial size was moderately dilated.  5. The mitral valve is grossly normal. No evidence of mitral valve regurgitation. No evidence of mitral stenosis.  6. Tricuspid valve regurgitation is mild to moderate.  7. The aortic valve is grossly normal. Aortic valve regurgitation is not visualized.  8. Aortic dilatation noted. There is mild dilatation of the ascending aorta, measuring 41 mm.  9. The inferior vena cava is dilated in size with <50% respiratory variability, suggesting right atrial pressure of 15 mmHg. Comparison(s): Prior images reviewed side by side. Changes from prior study are noted. Right ventricular size has increased and right ventricular function has decreased. FINDINGS  Left Ventricle: Left ventricular ejection fraction, by estimation, is 70 to 75%. The left ventricle has hyperdynamic function. The left ventricle has no regional wall motion abnormalities. Definity  contrast agent was given IV to delineate the left ventricular endocardial borders. The left ventricular internal cavity size was normal in size. There is borderline concentric left ventricular hypertrophy. Left ventricular diastolic function could not be evaluated due to atrial fibrillation. Left ventricular diastolic parameters are  indeterminate. Right Ventricle: The right ventricular size is severely enlarged. Mildly increased right ventricular wall thickness. Right ventricular systolic function is moderately reduced. There is severely elevated pulmonary artery systolic pressure. The tricuspid regurgitant velocity is 3.57 m/s, and with an assumed right atrial pressure of 15 mmHg, the estimated right ventricular systolic pressure is 66.0 mmHg. Left Atrium: Left atrial size was mildly dilated. Right Atrium: Right atrial size was moderately dilated. Pericardium: There is no evidence of pericardial effusion. Mitral Valve: The mitral valve is grossly normal. Mild mitral annular calcification. No evidence of mitral valve regurgitation. No evidence of mitral valve stenosis. Tricuspid Valve: The tricuspid valve  is grossly normal. Tricuspid valve regurgitation is mild to moderate. No evidence of tricuspid stenosis. Aortic Valve: The aortic valve is grossly normal. Aortic valve regurgitation is not visualized. Pulmonic Valve: The pulmonic valve was grossly normal. Pulmonic valve regurgitation is mild. Aorta: Aortic dilatation noted. There is mild dilatation of the ascending aorta, measuring 41 mm. Venous: The inferior vena cava is dilated in size with less than 50% respiratory variability, suggesting right atrial pressure of 15 mmHg. IAS/Shunts: The interatrial septum was not well visualized.  LEFT VENTRICLE PLAX 2D LVIDd:         4.00 cm LVIDs:         2.70 cm LV PW:         1.00 cm LV IVS:        1.20 cm  LV Volumes (MOD) LV vol d, MOD A2C: 109.7 ml LV vol d, MOD A4C: 112.0 ml LV vol s, MOD A2C: 25.5 ml LV vol s, MOD A4C: 41.3 ml LV SV MOD A2C:     84.3 ml LV SV MOD A4C:     112.0 ml LV SV MOD BP:      78.9 ml IVC IVC diam: 2.45 cm RIGHT ATRIUM           Index RA Area:     25.10 cm RA Volume:   80.90 ml  33.65 ml/m  AORTIC VALVE LVOT Vmax:   89.70 cm/s LVOT Vmean:  56.000 cm/s LVOT VTI:    0.194 m  AORTA Ao Asc diam: 4.03 cm MITRAL VALVE                 TRICUSPID VALVE MV Area (PHT): 3.65 cm     TR Peak grad:   51.0 mmHg MV Decel Time: 208 msec     TR Vmax:        357.00 cm/s MV E velocity: 102.00 cm/s                             SHUNTS                             Systemic VTI: 0.19 m Jerel Croitoru MD Electronically signed by Jerel Balding MD Signature Date/Time: 08/14/2024/7:26:01 PM    Final    VAS US  ABI WITH/WO TBI Result Date: 08/14/2024  LOWER EXTREMITY DOPPLER STUDY Patient Name:  Richar Dunklee  Date of Exam:   08/14/2024 Medical Rec #: [id number]    Accession #:    [id number] Date of Birth: [birth year]    Patient Gender: M Patient Age:   18 years Exam Location:  [facility] Procedure:      VAS US  ABI WITH/WO TBI Referring Phys: ERIC AUSTRIA --------------------------------------------------------------------------------  Indications: Peripheral artery disease. High Risk Factors: Hypertension, hyperlipidemia, past history of smoking,                    coronary artery disease.  Vascular Interventions: CHF, Lymphedema, Hx of DVT and venous stasis disease. Limitations: Today's exam was limited due to continuous patient movement,              tough/thickened skin. Comparison Study: Previous exam 03/04/2022 Performing Technologist: Leigh Rom RVT, RDMS  Examination Guidelines: A complete evaluation includes at minimum, Doppler waveform signals and systolic blood pressure reading at the level of bilateral brachial, anterior tibial, and posterior tibial arteries, when vessel segments are accessible. Bilateral  testing is considered an integral part of a complete examination. Photoelectric Plethysmograph (PPG) waveforms and toe systolic pressure readings are included as required and additional duplex testing as needed. Limited examinations for reoccurring indications may be performed as noted.  ABI Findings: +---------+------------------+-----+----------+----------------+ Right    Rt Pressure (mmHg)IndexWaveform  Comment           +---------+------------------+-----+----------+----------------+ Brachial 105                    triphasic                  +---------+------------------+-----+----------+----------------+ PTA                             monophasicnon compressible +---------+------------------+-----+----------+----------------+ DP                              monophasicnon compressible +---------+------------------+-----+----------+----------------+ Great Toe26                0.23 Abnormal                   +---------+------------------+-----+----------+----------------+ +---------+------------------+-----+----------+----------------+ Left     Lt Pressure (mmHg)IndexWaveform  Comment          +---------+------------------+-----+----------+----------------+ Brachial 112                    triphasic                  +---------+------------------+-----+----------+----------------+ PTA      127               1.13 monophasic                 +---------+------------------+-----+----------+----------------+ DP                              monophasicnon compressible +---------+------------------+-----+----------+----------------+ Great Toe20                0.18 Abnormal                   +---------+------------------+-----+----------+----------------+ +-------+-----------+-----------+------------+------------+ ABI/TBIToday's ABIToday's TBIPrevious ABIPrevious TBI +-------+-----------+-----------+------------+------------+ Right  [place]         0.23       [place]          0.75         +-------+-----------+-----------+------------+------------+ Left   [place]         0.18       [place]          0.57         +-------+-----------+-----------+------------+------------+ Arterial wall calcification precludes accurate ankle pressures and ABIs. Bilateral ABIs appear essentially unchanged. Bilateral TBIs appear decreased.  Summary: Right: Resting right ankle-brachial index indicates noncompressible  right lower extremity arteries. The right toe-brachial index is abnormal.  Left: Resting left ankle-brachial index indicates noncompressible left lower extremity arteries. The left toe-brachial index is abnormal.  *See table(s) above for measurements and observations.  Electronically signed by Gaile New MD on 08/14/2024 at 5:42:38 PM.    Final    DG CHEST PORT 1 VIEW Result Date: 08/14/2024 CLINICAL DATA:  Shortness of breath. EXAM: PORTABLE CHEST 1 VIEW COMPARISON:  August 13, 2024 FINDINGS: Stable cardiomegaly. Elevated right hemidiaphragm is noted. Minimal bibasilar subsegmental atelectasis is noted with small pleural effusions. IMPRESSION: Minimal bibasilar subsegmental atelectasis with small pleural effusions. Electronically Signed  By: Lynwood Landy Raddle M.D.   On: 08/14/2024 10:48   DG Chest 1 View Result Date: 08/13/2024 CLINICAL DATA:  Shortness of breath. EXAM: CHEST  1 VIEW COMPARISON:  07/17/2024. FINDINGS: Persistent low lung volumes with chronic elevation of the right hemidiaphragm and increased right basilar atelectasis. Left lung appears clear. No pneumothorax. Stable cardiomegaly. No acute osseous abnormality. IMPRESSION: Persistent low lung volumes with chronic elevation of the right hemidiaphragm and increased right basilar atelectasis. Electronically Signed   By: Harrietta Sherry M.D.   On: 08/13/2024 13:54    Microbiology: Results for orders placed or performed during the hospital encounter of 08/13/24  Blood culture (routine x 2)     Status: None   Collection Time: 08/13/24 12:28 PM   Specimen: BLOOD  Result Value Ref Range Status   Specimen Description   Final    BLOOD RIGHT ANTECUBITAL Performed at [facility], [address], [place], [place] [address]    Special Requests   Final    BOTTLES DRAWN AEROBIC AND ANAEROBIC Blood Culture adequate volume Performed at [facility], [address], [place], [place] [address]    Culture    Final    NO GROWTH 5 DAYS Performed at [facility], [address], [place], [place] [address]    Report Status 08/18/2024 FINAL  Final  Blood culture (routine x 2)     Status: Abnormal   Collection Time: 08/13/24  5:38 PM   Specimen: BLOOD LEFT HAND  Result Value Ref Range Status   Specimen Description   Final    BLOOD LEFT HAND Performed at [facility], [address], [place], [place] [address]    Special Requests   Final    BOTTLES DRAWN AEROBIC AND ANAEROBIC Blood Culture adequate volume Performed at [facility], [address] [place], [place] [address]    Culture  Setup Time   Final    GRAM POSITIVE COCCI IN CLUSTERS IN BOTH AEROBIC AND ANAEROBIC BOTTLES PHARMD M. SWAYNE [phone] @ [phone] FH    Culture (A)  Final    STAPHYLOCOCCUS AUREUS STAPHYLOCOCCUS EPIDERMIDIS BY BCID UNABLE TO ISOLATE FROM CULTURE Performed at [facility], [address], [place], [place] [address]    Report Status 08/16/2024 FINAL  Final   Organism ID, Bacteria STAPHYLOCOCCUS AUREUS  Final      Susceptibility   Staphylococcus aureus - MIC*    CIPROFLOXACIN  >=8 RESISTANT Resistant     ERYTHROMYCIN >=8 RESISTANT Resistant     GENTAMICIN <=0.5 SENSITIVE Sensitive     OXACILLIN 1 SENSITIVE Sensitive     TETRACYCLINE <=1 SENSITIVE Sensitive     VANCOMYCIN  <=0.5 SENSITIVE Sensitive     TRIMETH/SULFA >=320 RESISTANT Resistant     CLINDAMYCIN <=0.25 SENSITIVE Sensitive     RIFAMPIN <=0.5 SENSITIVE Sensitive     Inducible Clindamycin NEGATIVE Sensitive     LINEZOLID  2 SENSITIVE Sensitive     * STAPHYLOCOCCUS AUREUS  Blood Culture ID Panel (Reflexed)     Status: Abnormal   Collection Time: 08/13/24  5:38 PM  Result Value Ref Range Status   Enterococcus faecalis NOT DETECTED NOT DETECTED Final   Enterococcus Faecium NOT DETECTED NOT DETECTED Final   Listeria monocytogenes NOT DETECTED NOT DETECTED Final   Staphylococcus species DETECTED (A) NOT DETECTED Final     Comment: CRITICAL RESULT CALLED TO, READ BACK BY AND VERIFIED WITH: PHARMD M. SWAYNE [id number] @ 1647 FH  Staphylococcus aureus (BCID) DETECTED (A) NOT DETECTED Final    Comment: Methicillin (oxacillin) susceptible Staphylococcus aureus (MSSA). Preferred therapy is anti staphylococcal beta lactam antibiotic (Cefazolin  or Nafcillin), unless clinically contraindicated. CRITICAL RESULT CALLED TO, READ BACK BY AND VERIFIED WITH: PHARMD M. SWAYNE [id number] @ 1647 FH    Staphylococcus epidermidis DETECTED (A) NOT DETECTED Final    Comment: CRITICAL RESULT CALLED TO, READ BACK BY AND VERIFIED WITH: PHARMD EMERSON RAV [id number] @ 1647 FH    Staphylococcus lugdunensis NOT DETECTED NOT DETECTED Final   Streptococcus species NOT DETECTED NOT DETECTED Final   Streptococcus agalactiae NOT DETECTED NOT DETECTED Final   Streptococcus pneumoniae NOT DETECTED NOT DETECTED Final   Streptococcus pyogenes NOT DETECTED NOT DETECTED Final   A.calcoaceticus-baumannii NOT DETECTED NOT DETECTED Final   Bacteroides fragilis NOT DETECTED NOT DETECTED Final   Enterobacterales NOT DETECTED NOT DETECTED Final   Enterobacter cloacae complex NOT DETECTED NOT DETECTED Final   Escherichia coli NOT DETECTED NOT DETECTED Final   Klebsiella aerogenes NOT DETECTED NOT DETECTED Final   Klebsiella oxytoca NOT DETECTED NOT DETECTED Final   Klebsiella pneumoniae NOT DETECTED NOT DETECTED Final   Proteus species NOT DETECTED NOT DETECTED Final   Salmonella species NOT DETECTED NOT DETECTED Final   Serratia marcescens NOT DETECTED NOT DETECTED Final   Haemophilus influenzae NOT DETECTED NOT DETECTED Final   Neisseria meningitidis NOT DETECTED NOT DETECTED Final   Pseudomonas aeruginosa NOT DETECTED NOT DETECTED Final   Stenotrophomonas maltophilia NOT DETECTED NOT DETECTED Final   Candida albicans NOT DETECTED NOT DETECTED Final   Candida auris NOT DETECTED NOT DETECTED Final   Candida glabrata NOT DETECTED NOT DETECTED Final    Candida krusei NOT DETECTED NOT DETECTED Final   Candida parapsilosis NOT DETECTED NOT DETECTED Final   Candida tropicalis NOT DETECTED NOT DETECTED Final   Cryptococcus neoformans/gattii NOT DETECTED NOT DETECTED Final   Methicillin resistance mecA/C NOT DETECTED NOT DETECTED Final   Meth resistant mecA/C and MREJ NOT DETECTED NOT DETECTED Final    Comment: Performed at [facility], [address], [place], [place] [address]  MRSA Next Gen by PCR, Nasal     Status: None   Collection Time: 08/14/24  8:45 AM   Specimen: Nasal Mucosa; Nasal Swab  Result Value Ref Range Status   MRSA by PCR Next Gen NOT DETECTED NOT DETECTED Final    Comment: (NOTE) The GeneXpert MRSA Assay (FDA approved for NASAL specimens only), is one component of a comprehensive MRSA colonization surveillance program. It is not intended to diagnose MRSA infection nor to guide or monitor treatment for MRSA infections. Test performance is not FDA approved in patients less than 47 years old. Performed at [facility], [address], [place], [place] [address]   Culture, blood (Routine X 2) w Reflex to ID Panel     Status: None   Collection Time: 08/15/24 12:29 PM   Specimen: BLOOD LEFT ARM  Result Value Ref Range Status   Specimen Description   Final    BLOOD LEFT ARM Performed at [facility], [address], [place], [place] [address]    Special Requests   Final    BOTTLES DRAWN AEROBIC AND ANAEROBIC Blood Culture adequate volume Performed at [facility], [address], [place], [place] [address]    Culture   Final    NO GROWTH 5 DAYS Performed at [facility], [address], [place], [place] [address]  Report Status 08/20/2024 FINAL  Final  Culture, blood (Routine X 2) w Reflex to ID Panel     Status: None   Collection Time: 08/15/24 12:29 PM   Specimen: BLOOD RIGHT HAND  Result Value Ref Range Status   Specimen Description   Final    BLOOD  RIGHT HAND Performed at [facility], [address], [place], [place] [address]    Special Requests   Final    BOTTLES DRAWN AEROBIC AND ANAEROBIC Blood Culture adequate volume Performed at [facility], [address], [place], [place] [address]    Culture   Final    NO GROWTH 5 DAYS Performed at [facility], [address] [place], [place] [address]    Report Status 08/20/2024 FINAL  Final    Labs: CBC: Recent Labs  Lab 08/16/24 0350 08/19/24 0344 08/20/24 0918 08/21/24 0409  WBC 5.5 3.5* 3.3* 3.0*  HGB 9.4* 9.2* 10.2* 8.7*  HCT 29.7* 28.6* 31.1* 27.1*  MCV 112.5* 110.4* 105.4* 108.4*  PLT 89* 84* 91* 84*   Basic Metabolic Panel: Recent Labs  Lab 08/16/24 0350 08/17/24 0830 08/19/24 0344 08/20/24 0918 08/21/24 0409  NA 142 142 142 141 141  K 4.2 4.2 3.9 3.7 3.3*  CL 102 102 99 97* 97*  CO2 35* 37* 37* 36* 37*  GLUCOSE 92 79 74 127* 81  BUN 22 10 9 11 12   CREATININE 0.87 0.59* 0.57* 0.67 0.61  CALCIUM  8.7* 9.0 8.6* 9.0 8.3*   Liver Function Tests: Recent Labs  Lab 08/16/24 0350 08/17/24 0830  AST 65* 41  ALT 13 <5  ALKPHOS 69 64  BILITOT 0.3 0.4  PROT 5.4* 5.3*  ALBUMIN  3.0* 3.0*   CBG: Recent Labs  Lab 08/18/24 1155 08/18/24 2150 08/19/24 0618 08/19/24 0734 08/20/24 0709  GLUCAP 72 68* 68* 88 69*    Discharge time spent: greater than 30 minutes.  Signed: Lonni KANDICE Moose, MD [facility] 08/22/2024 "

## 2024-08-23 ENCOUNTER — Telehealth: Payer: Self-pay | Admitting: *Deleted

## 2024-08-23 ENCOUNTER — Telehealth: Payer: Self-pay

## 2024-08-23 NOTE — Telephone Encounter (Signed)
 Approved orders given to [name]

## 2024-08-23 NOTE — Transitions of Care (Post Inpatient/ED Visit) (Signed)
" ° °  08/23/2024  Name: [name] MRN: [id number] DOB: [birth year]  Today's TOC FU Call Status: Today's TOC FU Call Status:: Unsuccessful Call (1st Attempt) Unsuccessful Call (1st Attempt) Date: 08/23/24  Attempted to reach the patient regarding the most recent Inpatient visit.  Unsuccessful call attempt X 2- phone rang without pick up and spontaneously ended; second attempt person answered phone and call was spontaneously ended prior to [facility] explanation; subsequent attempts unsuccessful   Follow Up Plan: Additional outreach attempts will be made to reach the patient to complete the Transitions of Care (Post Inpatient/ED visit) call.   Pls call/ message for questions,  Caedin Mogan Mckinney Aviyah Swetz, RN, BSN, CCRN Alumnus RN Care Manager  Transitions of Care  [facility] [phone]: direct office  "

## 2024-08-23 NOTE — Telephone Encounter (Signed)
 Copied from CRM #[id number]. Topic: Clinical - Home Health Verbal Orders >> Aug 23, 2024  1:45 PM Alfonso HERO wrote: Caller/Agency: [place] [facility] Callback Number: [phone] Service Requested: Skilled Nursing, PT, OT Frequency: Nursing once for 9 weeks, PT & OT once for 1 weeks for evaluation. Any new concerns about the patient? No   Okay for verbal orders?

## 2024-08-23 NOTE — Telephone Encounter (Signed)
 Yes, okay.

## 2024-08-24 ENCOUNTER — Telehealth: Payer: Self-pay

## 2024-08-24 ENCOUNTER — Telehealth: Payer: Self-pay | Admitting: *Deleted

## 2024-08-24 ENCOUNTER — Encounter (HOSPITAL_BASED_OUTPATIENT_CLINIC_OR_DEPARTMENT_OTHER): Admitting: General Surgery

## 2024-08-24 NOTE — Telephone Encounter (Signed)
 Pt's hospital vitals reviewed, BP fluctuates between 100-150 systolic and 60-90 diastolic. Spoke with [name] to obtain further details; pt not experiencing dizziness or headache. This is the only time BP has been this low since hospital discharge. Confirmed pt had taken medication prior to BP check.  Please further advise if medication d/c

## 2024-08-24 NOTE — Telephone Encounter (Signed)
 Copied from CRM [id number]. Topic: Clinical - Prescription Issue >> Aug 23, 2024  4:54 PM Alfonso ORN wrote: Reason for CRM: [facility] called back regarding findings the patient has low bp 96/62 believes its because of Rx atenolol  (TENORMIN ) 25 MG tablet [[id number]] and wants to discuss if pt should discontinue   please callback to advise [phone]

## 2024-08-24 NOTE — Transitions of Care (Post Inpatient/ED Visit) (Signed)
 "  08/24/2024  Name: [name] MRN: [id number] DOB: 1951-06-11  Today's TOC FU Call Status: Today's TOC FU Call Status:: Successful TOC FU Call Completed TOC FU Call Complete Date: 08/24/24  Patient's Name and Date of Birth confirmed. Name, DOB (initially per patient; patient requests that I complete TOC call with his spouse- verified HIPAA indentifiers with spouse [name]- verified on [facility] DPR)  Transition Care Management Follow-up Telephone Call Date of Discharge: 08/22/24 Discharge Facility: [facility]) Type of Discharge: Inpatient Admission Primary Inpatient Discharge Diagnosis:: Sepsis/ AKI: acute bacterial endocarditis How have you been since you were released from the hospital?: Same (He is not like himself; he seems confused after these hospital visits; but we are here doing whatever needs to be done and have so many people coming in.  My daughter is in healthcare, my sister is a engineer, civil (consulting). I don't want or need more help or phone calls) Any questions or concerns?: Yes Patient Questions/Concerns:: I don't know if or how we'll be able to get him out to these appointments in this ice- and he is so weak after 2 hospital visits;  Patient Questions/Concerns Addressed: Other: (Offered TOC 30-day program enrollment- she declines stating has home health RN and PT, along with healthcare daughter (pharmacist) and sister who is nurse: reports do not want or need additional phone calls too many people coming in/ calling as it is)  Items Reviewed: Did you receive and understand the discharge instructions provided?: Yes (spouse declined thorough review of AVS: I don't know where the papers are right now) Medications obtained,verified, and reconciled?: Partial Review Completed (Partial medication reconciliation/ review completed; per spouse memory; confirmed patient obtained/ is taking all newly Rx'd medications as instructed per spouse report; self-manages medications and denies questions/  concerns around medications today) Reason for Partial Mediation Review: Spouse declined: we reviewed all of it at the hospital, and then again earlier today with the home health nnurse; we are straight on his medications and I do not want to go over them again Any new allergies since your discharge?: No Dietary orders reviewed?: No (spouse declined) Do you have support at home?: Yes People in Home [RPT]: child(ren), adult, spouse Name of Support/Comfort Primary Source: Spouse reports patient requires assistance in self-care activities; resides with supportive spouse, adult daughter and son-in-law-- all assists as/ if needed/ indicated  Medications Reviewed Today: Medications Reviewed Today     Reviewed by Antrone Walla M, RN (Registered Nurse) on 08/24/24 at 1606  Med List Status: <None>   Medication Order Taking? Sig Documenting Provider Last Dose Status Informant  acetaminophen  (TYLENOL ) 325 MG tablet [id number]  Take 2 tablets (650 mg total) by mouth every 6 (six) hours as needed (as needed for pain). Arrien, Elidia Sieving, MD  Active Spouse/Significant Other  albuterol  (PROVENTIL ) (2.5 MG/3ML) 0.083% nebulizer solution [id number]  Take 3 mLs (2.5 mg total) by nebulization every 4 (four) hours as needed for wheezing. Maranda Lonni MATSU, MD  Active   apixaban  (ELIQUIS ) 2.5 MG TABS tablet [id number] Yes Take 1 tablet (2.5 mg total) by mouth 2 (two) times daily. Okey Vina GAILS, MD  Active Spouse/Significant Other  atenolol  (TENORMIN ) 25 MG tablet [id number] Yes Take 1 tablet (25 mg total) by mouth 2 (two) times daily. Maranda Lonni MATSU, MD  Active   b complex vitamins tablet [id number]  Take 1 tablet by mouth every evening. [provider]  Active Spouse/Significant Other  ceFAZolin  (ANCEF ) IVPB [id number] Yes Inject 2 g into the vein every 8 (  eight) hours for 8 days. Indication:  MSSA bacteremia  First Dose: Yes Last Day of Therapy:  08/28/2024 Labs - Once weekly:  CBC/D and  BMP, Labs - Once weekly: ESR and CRP Method of administration: IV Push Method of administration may be changed at the discretion of home infusion pharmacist based upon assessment of the patient and/or caregiver's ability to self-administer the medication ordered. Vann, Jessica U, DO  Active   Cholecalciferol (VITAMIN D-3 PO) [id number]  Take 1 capsule by mouth daily. [provider]  Active Spouse/Significant Other           Med Note MARISA NATHANEL LOISE Pablo Aug 13, 2024  4:07 PM) Strength?  Coenzyme Q10 (COQ-10 PO) [id number] Yes Take 1 capsule by mouth every evening. [provider]  Active Spouse/Significant Other  ezetimibe  (ZETIA ) 10 MG tablet [id number]  Take 1 tablet (10 mg total) by mouth daily.  Patient taking differently: Take 10 mg by mouth every evening.   Lucien Orren LOISE, PA-C  Active Spouse/Significant Other  fluticasone  (FLONASE ) 50 MCG/ACT nasal spray [id number]  Place 1 spray into both nostrils 2 (two) times daily as needed for allergies or rhinitis. [provider]  Active Spouse/Significant Other  folic acid  (FOLVITE ) 400 MCG tablet [id number]  Take 400-800 mcg by mouth daily. [provider]  Active Spouse/Significant Other  furosemide  (LASIX ) 40 MG tablet [id number]  Take 40 mg by mouth every other morning Maranda Lonni MATSU, MD  Active   guaiFENesin  (ROBITUSSIN) 100 MG/5ML liquid [id number]  Take 5 mLs by mouth every 4 (four) hours as needed for cough or to loosen phlegm. Maranda Lonni MATSU, MD  Active   KRILL OIL PO [id number] Yes Take 1 capsule by mouth daily. [provider]  Active Spouse/Significant Other           Med Note MARISA, NATHANEL LOISE Pablo Aug 13, 2024  4:07 PM) Strength?  levocetirizine (XYZAL ) 5 MG tablet [id number]  Take 5 mg by mouth See admin instructions. Take 5 mg by mouth in the evening as needed for allergies [provider]  Active Spouse/Significant Other  liver oil-zinc  oxide (DESITIN) 40 % ointment [id number]   Apply 1 Application topically 2 (two) times daily. Maranda Lonni MATSU, MD  Active   Multiple Vitamin (MULTIVITAMIN WITH MINERALS) TABS tablet [id number]  Take 1 tablet by mouth daily.  Patient not taking: Reported on 08/13/2024   Arrien, Elidia Sieving, MD  Active Spouse/Significant Other  potassium chloride  (KLOR-CON ) 10 MEQ tablet [id number]  Take 10 mEq by mouth every other day. [provider]  Active Spouse/Significant Other  rosuvastatin  (CRESTOR ) 5 MG tablet [id number]  Take 5 mg by mouth daily. [provider]  Active Spouse/Significant Other  tamsulosin  (FLOMAX ) 0.4 MG CAPS capsule [id number] Yes Take 1 capsule (0.4 mg total) by mouth every evening. Maranda Lonni MATSU, MD  Active   thiamine  (VITAMIN B1) 100 MG tablet [id number]  Take 1 tablet (100 mg total) by mouth daily. Maranda Lonni MATSU, MD  Active   traZODone  (DESYREL ) 50 MG tablet [id number]  Take 0.5 tablets (25 mg total) by mouth at bedtime as needed for sleep. Maranda Lonni MATSU, MD  Active   Med List Note Marisa Nathanel LOISE, CPhT 08/13/24 1601): Patient was at [facility] from 07/20/24 through 08/11/24           Home Care and Equipment/Supplies: Were Home Health Services Ordered?: Yes Name of Home Health Agency:: [facility]:  RN and PT Has Agency set up a time to come to your home?: Yes First Home Health Visit Date: 08/23/24 (per spouse: Nurse came yesterday and PT came today) Any new equipment or medical supplies ordered?: Yes (Hoyer lift and IV equipment) Name of Medical supply agency?: [employer] for hoyer lift: They tell us  it should be delivered soon; it has not yet come; confirmed she has contact information for DME company; IV equipment/ antibiotics - [employer]: confirmed IV equipment and antibiotics have arrived Were you able to get the equipment/medical supplies?: Yes (IV equipment and medications; hoyer lift pending arrival through [employer]) Do you have any  questions related to the use of the equipment/supplies?: No  Functional Questionnaire: Do you need assistance with bathing/showering or dressing?: Yes (wife and adult children supervise/ assists as indicated) Do you need assistance with meal preparation?: Yes (wife and adult children prepare all meals for patient) Do you need assistance with eating?: Yes (wife and adult children supervise/ assists as indicated) Do you have difficulty maintaining continence: Yes (wife and adult children assists with toileting needs) Do you need assistance with getting out of bed/getting out of a chair/moving?: Yes (wife and adult children supervise/ assists as indicated- reports patient mainly stays in his electric wheelchair all the time) Do you have difficulty managing or taking your medications?: Yes (wife and adult children supervise/ assists as indicated)  Follow up appointments reviewed: PCP Follow-up appointment confirmed?: No (Spouse declined scheduling Hospital follow up office visit with PCP by [facility] RN CM in real-time) MD Provider Line Number:[phone] Given: No (verified well-established with current PCP) Specialist Hospital Follow-up appointment confirmed?: Yes Date of Specialist follow-up appointment?: 08/27/24 Follow-Up Specialty Provider:: ID provider Do you need transportation to your follow-up appointment?: No Do you understand care options if your condition(s) worsen?: Yes-patient verbalized understanding  SDOH Interventions Today    Flowsheet Row Most Recent Value  SDOH Interventions   Food Insecurity Interventions Intervention Not Indicated  [per spouse report]  Housing Interventions Intervention Not Indicated  [per spouse report]  Transportation Interventions Intervention Not Indicated  [per spouse report: we used to take him to the appointments he has- but now after these 2 hospital visits, he is so weak I don't know if my son-in-law will be able to get him in the car or not,   Declines resources: we'll just have to see how PT goes]  Utilities Interventions Intervention Not Indicated  [per spouse report]   See TOC assessment tabs for additional assessment/ TOC intervention information Provided education/ reinforcement re: signs/ symptoms wound infection along with corresponding action plan for 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 Provided education/ reinforcement re: basic care at home of chronic wounds; need to keep pressure off chronic sacral bedsores; hygiene considerations; high protein dietary intake   Patient's caregiver- spouse declines need for ongoing/ further care management/ coordination outreach; declines enrollment in 30-day TOC program- declines taking my direct phone number should needs/ concerns arise post-TOC call   Pls call/ message for questions,  Pacen Watford Mckinney Jamarrius Salay, RN, BSN, Media Planner  Transitions of Care  [facility] [phone]: direct office  "

## 2024-08-25 ENCOUNTER — Inpatient Hospital Stay (HOSPITAL_COMMUNITY)
Admission: EM | Admit: 2024-08-25 | Source: Home / Self Care | Attending: Pulmonary Disease | Admitting: Pulmonary Disease

## 2024-08-25 ENCOUNTER — Emergency Department (HOSPITAL_COMMUNITY)

## 2024-08-25 ENCOUNTER — Other Ambulatory Visit: Payer: Self-pay

## 2024-08-25 ENCOUNTER — Encounter (HOSPITAL_COMMUNITY): Payer: Self-pay

## 2024-08-25 DIAGNOSIS — R410 Disorientation, unspecified: Principal | ICD-10-CM

## 2024-08-25 DIAGNOSIS — J9611 Chronic respiratory failure with hypoxia: Secondary | ICD-10-CM

## 2024-08-25 DIAGNOSIS — N179 Acute kidney failure, unspecified: Secondary | ICD-10-CM

## 2024-08-25 DIAGNOSIS — J9600 Acute respiratory failure, unspecified whether with hypoxia or hypercapnia: Secondary | ICD-10-CM | POA: Diagnosis present

## 2024-08-25 LAB — I-STAT VENOUS BLOOD GAS, ED
Acid-Base Excess: 7 mmol/L — ABNORMAL HIGH (ref 0.0–2.0)
Bicarbonate: 33.6 mmol/L — ABNORMAL HIGH (ref 20.0–28.0)
Calcium, Ion: 1.11 mmol/L — ABNORMAL LOW (ref 1.15–1.40)
HCT: 32 % — ABNORMAL LOW (ref 39.0–52.0)
Hemoglobin: 10.9 g/dL — ABNORMAL LOW (ref 13.0–17.0)
O2 Saturation: 80 %
Potassium: 4.2 mmol/L (ref 3.5–5.1)
Sodium: 138 mmol/L (ref 135–145)
TCO2: 35 mmol/L — ABNORMAL HIGH (ref 22–32)
pCO2, Ven: 60.4 mmHg — ABNORMAL HIGH (ref 44–60)
pH, Ven: 7.354 (ref 7.25–7.43)
pO2, Ven: 48 mmHg — ABNORMAL HIGH (ref 32–45)

## 2024-08-25 LAB — BASIC METABOLIC PANEL WITH GFR
Anion gap: 15 (ref 5–15)
Anion gap: 11 (ref 5–15)
BUN: 34 mg/dL — ABNORMAL HIGH (ref 8–23)
BUN: 35 mg/dL — ABNORMAL HIGH (ref 8–23)
CO2: 29 mmol/L (ref 22–32)
CO2: 31 mmol/L (ref 22–32)
Calcium: 8.6 mg/dL — ABNORMAL LOW (ref 8.9–10.3)
Calcium: 8 mg/dL — ABNORMAL LOW (ref 8.9–10.3)
Chloride: 94 mmol/L — ABNORMAL LOW (ref 98–111)
Chloride: 99 mmol/L (ref 98–111)
Creatinine, Ser: 2.81 mg/dL — ABNORMAL HIGH (ref 0.61–1.24)
Creatinine, Ser: 2.44 mg/dL — ABNORMAL HIGH (ref 0.61–1.24)
GFR, Estimated: 23 mL/min — ABNORMAL LOW
GFR, Estimated: 27 mL/min — ABNORMAL LOW
Glucose, Bld: 75 mg/dL (ref 70–99)
Glucose, Bld: 71 mg/dL (ref 70–99)
Potassium: 4.6 mmol/L (ref 3.5–5.1)
Potassium: 4.1 mmol/L (ref 3.5–5.1)
Sodium: 139 mmol/L (ref 135–145)
Sodium: 140 mmol/L (ref 135–145)

## 2024-08-25 LAB — PROTIME-INR
INR: 1.9 — ABNORMAL HIGH (ref 0.8–1.2)
Prothrombin Time: 22.9 s — ABNORMAL HIGH (ref 11.4–15.2)

## 2024-08-25 LAB — CBC
HCT: 31.1 % — ABNORMAL LOW (ref 39.0–52.0)
Hemoglobin: 10.2 g/dL — ABNORMAL LOW (ref 13.0–17.0)
MCH: 34.9 pg — ABNORMAL HIGH (ref 26.0–34.0)
MCHC: 32.8 g/dL (ref 30.0–36.0)
MCV: 106.5 fL — ABNORMAL HIGH (ref 80.0–100.0)
Platelets: 129 10*3/uL — ABNORMAL LOW (ref 150–400)
RBC: 2.92 MIL/uL — ABNORMAL LOW (ref 4.22–5.81)
RDW: 13.9 % (ref 11.5–15.5)
WBC: 10.5 10*3/uL (ref 4.0–10.5)
nRBC: 0 % (ref 0.0–0.2)

## 2024-08-25 LAB — GLUCOSE, CAPILLARY
Glucose-Capillary: 73 mg/dL (ref 70–99)
Glucose-Capillary: 66 mg/dL — ABNORMAL LOW (ref 70–99)
Glucose-Capillary: 63 mg/dL — ABNORMAL LOW (ref 70–99)
Glucose-Capillary: 96 mg/dL (ref 70–99)
Glucose-Capillary: 98 mg/dL (ref 70–99)

## 2024-08-25 LAB — APTT
aPTT: 52 s — ABNORMAL HIGH (ref 24–36)
aPTT: 61 s — ABNORMAL HIGH (ref 24–36)

## 2024-08-25 LAB — HEPARIN LEVEL (UNFRACTIONATED): Heparin Unfractionated: >1.1 [IU]/mL — ABNORMAL HIGH (ref 0.30–0.70)

## 2024-08-25 LAB — PRO BRAIN NATRIURETIC PEPTIDE: Pro Brain Natriuretic Peptide: 1825 pg/mL — ABNORMAL HIGH

## 2024-08-25 LAB — I-STAT CG4 LACTIC ACID, ED: Lactic Acid, Venous: 1.5 mmol/L (ref 0.5–1.9)

## 2024-08-25 LAB — MRSA NEXT GEN BY PCR, NASAL: MRSA by PCR Next Gen: NOT DETECTED

## 2024-08-25 MED ORDER — POLYETHYLENE GLYCOL 3350 17 G PO PACK
17.0000 g | PACK | Freq: Every day | ORAL | Status: AC | PRN
  Administered 2024-08-27: 17 g via ORAL
  Filled 2024-08-25: qty 1

## 2024-08-25 MED ORDER — HYDROCERIN EX CREA
TOPICAL_CREAM | Freq: Every day | CUTANEOUS | Status: AC
  Filled 2024-08-25: qty 113

## 2024-08-25 MED ORDER — FUROSEMIDE 10 MG/ML IJ SOLN
40.0000 mg | Freq: Once | INTRAMUSCULAR | Status: AC
  Administered 2024-08-25: 40 mg via INTRAVENOUS
  Filled 2024-08-25: qty 4

## 2024-08-25 MED ORDER — DEXTROSE 50 % IV SOLN
INTRAVENOUS | Status: AC
  Filled 2024-08-25: qty 50

## 2024-08-25 MED ORDER — GERHARDT'S BUTT CREAM
1.0000 | TOPICAL_CREAM | Freq: Two times a day (BID) | CUTANEOUS | Status: AC
  Administered 2024-08-25 – 2024-08-31 (×13): 1 via TOPICAL
  Filled 2024-08-25: qty 60

## 2024-08-25 MED ORDER — VANCOMYCIN HCL IN DEXTROSE 1-5 GM/200ML-% IV SOLN: 1000.0000 mg | Freq: Once | INTRAVENOUS | Status: DC

## 2024-08-25 MED ORDER — DEXTROSE 50 % IV SOLN: 12.5000 g | Freq: Once | INTRAVENOUS | Status: AC

## 2024-08-25 MED ORDER — DEXTROSE 50 % IV SOLN
12.5000 g | Freq: Once | INTRAVENOUS | Status: AC
  Administered 2024-08-25: 12.5 g via INTRAVENOUS

## 2024-08-25 MED ORDER — DEXTROSE 10 % IV SOLN: INTRAVENOUS | Status: DC

## 2024-08-25 MED ORDER — CEFAZOLIN SODIUM-DEXTROSE 2-4 GM/100ML-% IV SOLN
2.0000 g | Freq: Three times a day (TID) | INTRAVENOUS | Status: AC
  Administered 2024-08-25 – 2024-08-28 (×11): 2 g via INTRAVENOUS
  Filled 2024-08-25 (×13): qty 100

## 2024-08-25 MED ORDER — LACTATED RINGERS IV BOLUS (SEPSIS): 1000.0000 mL | Freq: Once | INTRAVENOUS | Status: DC

## 2024-08-25 MED ORDER — CHLORHEXIDINE GLUCONATE CLOTH 2 % EX PADS
6.0000 | MEDICATED_PAD | Freq: Every day | CUTANEOUS | Status: AC
  Administered 2024-08-25 – 2024-08-31 (×6): 6 via TOPICAL

## 2024-08-25 MED ORDER — METRONIDAZOLE 500 MG/100ML IV SOLN
500.0000 mg | Freq: Once | INTRAVENOUS | Status: AC
  Administered 2024-08-25: 500 mg via INTRAVENOUS
  Filled 2024-08-25: qty 100

## 2024-08-25 MED ORDER — LACTATED RINGERS IV BOLUS (SEPSIS)
1000.0000 mL | Freq: Once | INTRAVENOUS | Status: AC
  Administered 2024-08-25: 1000 mL via INTRAVENOUS

## 2024-08-25 MED ORDER — CEFAZOLIN SODIUM-DEXTROSE 2-4 GM/100ML-% IV SOLN
2.0000 g | Freq: Two times a day (BID) | INTRAVENOUS | Status: DC
  Filled 2024-08-25: qty 100

## 2024-08-25 MED ORDER — VANCOMYCIN HCL 2000 MG/400ML IV SOLN
2000.0000 mg | Freq: Once | INTRAVENOUS | Status: DC
  Filled 2024-08-25: qty 400

## 2024-08-25 MED ORDER — LACTATED RINGERS IV SOLN: INTRAVENOUS | Status: DC

## 2024-08-25 MED ORDER — ORAL CARE MOUTH RINSE: 15.0000 mL | OROMUCOSAL | Status: DC | PRN

## 2024-08-25 MED ORDER — SODIUM CHLORIDE 0.9 % IV BOLUS (SEPSIS)
1000.0000 mL | Freq: Once | INTRAVENOUS | Status: AC
  Administered 2024-08-25: 1000 mL via INTRAVENOUS

## 2024-08-25 MED ORDER — SODIUM CHLORIDE 0.9 % IV SOLN: 1.0000 g | INTRAVENOUS | Status: DC

## 2024-08-25 MED ORDER — DEXTROSE 50 % IV SOLN
INTRAVENOUS | Status: AC
  Administered 2024-08-25: 12.5 g via INTRAVENOUS
  Filled 2024-08-25: qty 50

## 2024-08-25 MED ORDER — SODIUM CHLORIDE 0.9 % IV SOLN
2.0000 g | Freq: Once | INTRAVENOUS | Status: AC
  Administered 2024-08-25: 2 g via INTRAVENOUS
  Filled 2024-08-25: qty 12.5

## 2024-08-25 MED ORDER — HEPARIN (PORCINE) 25000 UT/250ML-% IV SOLN
2000.0000 [IU]/h | INTRAVENOUS | Status: DC
  Administered 2024-08-25: 1600 [IU]/h via INTRAVENOUS
  Administered 2024-08-25: 1800 [IU]/h via INTRAVENOUS
  Filled 2024-08-25 (×2): qty 250

## 2024-08-25 MED ORDER — ORAL CARE MOUTH RINSE
15.0000 mL | OROMUCOSAL | Status: DC
  Administered 2024-08-25: 15 mL via OROMUCOSAL

## 2024-08-25 MED ORDER — SENNA 8.6 MG PO TABS
1.0000 | ORAL_TABLET | Freq: Two times a day (BID) | ORAL | Status: AC | PRN
  Administered 2024-08-27: 8.6 mg via ORAL
  Filled 2024-08-25: qty 1

## 2024-08-25 MED ORDER — HEPARIN SODIUM (PORCINE) 5000 UNIT/ML IJ SOLN: 5000.0000 [IU] | Freq: Three times a day (TID) | INTRAMUSCULAR | Status: DC

## 2024-08-25 MED ORDER — NOREPINEPHRINE 4 MG/250ML-% IV SOLN: 0.0000 ug/min | INTRAVENOUS | Status: DC

## 2024-08-25 NOTE — Plan of Care (Signed)
  Problem: Education: Goal: Knowledge of General Education information will improve Description: Including pain rating scale, medication(s)/side effects and non-pharmacologic comfort measures Outcome: Progressing   Problem: Health Behavior/Discharge Planning: Goal: Ability to manage health-related needs will improve Outcome: Progressing   Problem: Clinical Measurements: Goal: Ability to maintain clinical measurements within normal limits will improve Outcome: Progressing Goal: Will remain free from infection Outcome: Progressing Goal: Diagnostic test results will improve Outcome: Progressing Goal: Respiratory complications will improve Outcome: Progressing Goal: Cardiovascular complication will be avoided Outcome: Progressing   Problem: Safety: Goal: Ability to remain free from injury will improve Outcome: Progressing   Problem: Pain Managment: Goal: General experience of comfort will improve and/or be controlled Outcome: Progressing

## 2024-08-25 NOTE — ED Notes (Signed)
 Bladder scan 21 ml noted

## 2024-08-25 NOTE — H&P (Signed)
 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 Now comes again with similar presentation of dyspnea and hypotension. The patient is a poor historian and is bordering on respiratory extremis upon our arrival. He was immediately placed on NIV with improvement in his WOB, but with hypotension. He was intolerant to diuretics with worsening of his renal function. He is presently on Ancef  to complete 2/3 via a PICC placed 1/25. PCCM consulted for admission.  Pertinent  Medical History  DCS [name] 08/22/2024 Hospital Course: Significant Hospital events: 1/19: Admit [facility], received 3.5 L IVF, 12.5 g albumin  x 2, started on midodrine  1/20: Transferred to ICU, PCCM consulted, discontinue midodrine  due to reflex bradycardia, started on norepinephrine , started on cefazolin  for positive blood culture with MSSA, staph epi.  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  He was found to be in septic shock.  He was transferred to the ICU.  Required pressors.  Started on broad-spectrum antibiotics.  He developed AKI secondary to sepsis.  Creatinine has returned towards baseline.  Admission blood cultures grew MSSA as well as Staphylococcus epidermidis.  ID was consulted.  Source of infection was felt to likely be from his chronic sacral wounds.  TEE was performed without vegetations.  Repeat blood cultures remain sterile.  Staph epidermidis was felt to be a skin contaminant.  PICC line placement on 125 after blood cultures on the 21st remained negative for 96 hours.  He will require a 2-week course of IV Ancef  via PICC line and is right upper extremity which will complete on August 28, 2024.  In addition he had difficulty with anasarca and required diuresis.  He required a volume targeted ventilator to use at night due to his severe obstructive sleep apnea.  The time of discharge he is afebrile hemodynamically stable and clinically improved.  He remains with all of his comorbid conditions.  Stable for discharge home on the home ventilator nightly with home health for wound care and monitoring of antibiotic therapy and the PICC line.   Significant Hospital Events: Including procedures, antibiotic start and stop dates in addition to other pertinent events     Interim History / Subjective:  PCCM consulted for admit  Objective    Blood pressure (!) 181/156, pulse 80, temperature 98.3 F (36.8 C), temperature source Oral, resp. rate (!) 22, height 5' 11 (1.803 m), weight 120 kg, SpO2 100%.    FiO2 (%):  [40 %] 40 %  Intake/Output Summary (Last 24 hours) at 08/25/2024 9391 Last data filed at 08/25/2024 0550 Gross per 24 hour  Intake 1931.64 ml  Output --  Net 1931.64 ml   Filed Weights   08/25/24 0226  Weight: 120 kg    Examination: General: Extremis, Resp distress  on HHFNC HENT: PERLLA/EOMI Lungs: diminished at the bases, rales, rhonchi Cardiovascular: RRR, extrasystoles Abdomen: globular, diminished bowel sounds  Extremities: 3+ edema LE, decub pictures are appended to the record Neuro: mildly confused  GU: deferred   Resolved problem list   Assessment and Plan  Hypotension A/C Hypoxic and Hypercapnic Resp Failure Sepsis, septic shock? HFpEF with LAE  NIV start immediately with improved status, daughter would use ETT/MV if needed Start Levophed  for MAP 60-65 Consider Limited echo if pressor need is not brief Suspend volume resuscitation  Attempt diuresis,  however, suspect this will be cyclic AKI, may need mech assist for dry weight Renal US  Broaden antibiosis up front and culture, would re-engage ID MRSA scr Viral resp scr    Labs   CBC: Recent Labs  Lab 08/19/24 0344 08/20/24 0918 08/21/24 0409 08/25/24 0325 08/25/24 0344  WBC 3.5* 3.3* 3.0* 10.5  --   HGB 9.2* 10.2* 8.7* 10.2* 10.9*  HCT 28.6* 31.1* 27.1* 31.1* 32.0*  MCV 110.4* 105.4* 108.4* 106.5*  --   PLT 84* 91* 84* 129*  --     Basic Metabolic Panel: Recent Labs  Lab 08/19/24 0344 08/20/24 0918 08/21/24 0409 08/25/24 0325 08/25/24 0344  NA 142 141 141 139 138  K 3.9 3.7 3.3* 4.6 4.2  CL 99 97* 97* 94*  --   CO2 37* 36* 37* 29  --   GLUCOSE 74 127* 81 75  --   BUN 9 11 12  34*  --   CREATININE 0.57* 0.67 0.61 2.81*  --   CALCIUM  8.6* 9.0 8.3* 8.6*  --    GFR: Estimated Creatinine Clearance (by C-G formula based on SCr of 2.81 mg/dL (H)) Male: 75.6 mL/min (A) Male: 29.5 mL/min (A) Recent Labs  Lab 08/19/24 0344 08/20/24 0918 08/21/24 0409 08/25/24 0325 08/25/24 0344  WBC 3.5* 3.3* 3.0* 10.5  --   LATICACIDVEN  --   --   --   --  1.5    Liver Function Tests: No results for input(s): AST, ALT, ALKPHOS, BILITOT, PROT, ALBUMIN  in the last 168 hours. No results for input(s): LIPASE, AMYLASE in the last 168 hours. No results  for input(s): AMMONIA in the last 168 hours.  ABG    Component Value Date/Time   PHART 7.373 08/14/2024 1025   PCO2ART 54.1 (H) 08/14/2024 1025   PO2ART 171 (H) 08/14/2024 1025   HCO3 33.6 (H) 08/25/2024 0344   TCO2 35 (H) 08/25/2024 0344   O2SAT 80 08/25/2024 0344     Coagulation Profile: Recent Labs  Lab 08/25/24 0325  INR 1.9*    Cardiac Enzymes: No results for input(s): CKTOTAL, CKMB, CKMBINDEX, TROPONINI in the last 168 hours.  HbA1C: Hgb A1c MFr Bld  Date/Time Value Ref Range Status  05/05/2023 03:14 PM 5.3 4.8 - 5.6 % Final    Comment:             Prediabetes: 5.7 - 6.4          Diabetes: >6.4          Glycemic control for adults with diabetes: <7.0   04/05/2022 12:25 PM 5.2 4.8 - 5.6 % Final    Comment:  Prediabetes: 5.7 - 6.4          Diabetes: >6.4          Glycemic control for adults with diabetes: <7.0     CBG: Recent Labs  Lab 08/18/24 1155 08/18/24 2150 08/19/24 0618 08/19/24 0734 08/20/24 0709  GLUCAP 72 68* 68* 88 69*    Review of Systems:   Not obtainable   Past Medical History:  He,  has a past medical history of Adenomatous colon polyp - diminutive (06/2019), Allergy (sinus), Bilateral lower extremity edema, BPH with lower urinary tract symptoms without urinary obstruction, Cataract, Coronary artery disease, DVT (deep venous thrombosis) (HCC), Dyspnea, Fungal infection of lung, Glaucoma, Hay fever, Hemidiaphragm paralysis, HTN (hypertension), Hyperlipidemia, Inclusion body myositis (HCC), Macrocytic anemia, Osteoarthritis, multiple sites, Palatal fistula, Palpable abd. aorta, RBBB, Spinal stenosis, and Thrombocytopenia.   Surgical History:   Past Surgical History:  Procedure Laterality Date   APPENDECTOMY  2010   BONE MARROW BIOPSY     2025 benign   CERVICAL FUSION  09/2009   CHOLECYSTECTOMY  1998   COLONOSCOPY     2020-->no recall   EYE SURGERY     Lasik   HYDROCELE EXCISION  2005   JOINT REPLACEMENT   05/22/20   right hip   LEFT HEART CATHETERIZATION WITH CORONARY ANGIOGRAM N/A 06/24/2014   Procedure: LEFT HEART CATHETERIZATION WITH CORONARY ANGIOGRAM;  Surgeon: Debby DELENA Sor, MD;  Location: [facility] CATH LAB;  Service: Cardiovascular;  Laterality: N/A;   LUMBAR SPINE SURGERY  11/2011   MUSCLE BIOPSY Left 07/11/2018   Procedure: Left Vastus lateralus muscle biopsy;  Surgeon: Cheryle Debby DELENA, MD;  Location: [facility] OR;  Service: Neurosurgery;  Laterality: Left;  Left Vastus lateralus muscle biopsy   PERCUTANEOUS CORONARY STENT INTERVENTION (PCI-S)  06/24/2014   DES to RCA. Procedure: PERCUTANEOUS CORONARY STENT INTERVENTION (PCI-S);  Surgeon: Debby DELENA Sor, MD;  Location: [facility] CATH LAB;  Service: Cardiovascular;;  Distal RCA   Rhythm monitoring     04/2023, occ pvc's   SPINE SURGERY     TONSILLECTOMY AND ADENOIDECTOMY  2012   TOTAL HIP ARTHROPLASTY Right 2021   RIGHT   TRANSESOPHAGEAL ECHOCARDIOGRAM (CATH LAB) N/A 08/17/2024   Procedure: TRANSESOPHAGEAL ECHOCARDIOGRAM;  Surgeon: Francyne Headland, MD;  Location: [facility] INVASIVE CV LAB;  Service: Cardiovascular;  Laterality: N/A;   TRANSTHORACIC ECHOCARDIOGRAM     03/2019 normal.  Nov 2024 mild RV dysf, o/w normal     Social History:   reports that he quit smoking about 35 years ago. His smoking use included cigarettes. He started smoking about 58 years ago. He has a 17.3 pack-year smoking history. He has never used smokeless tobacco. He reports current alcohol use. He reports that he does not use drugs.   Family History:  His family history includes Aneurysm in his father; Cancer in his mother; Cystic fibrosis in his brother; Early death in his brother and father; Esophageal cancer in his mother; Heart attack in his mother; Heart disease in his father. There is no history of Stroke.   Allergies Allergies[1]   Home Medications  Prior to Admission medications  Medication Sig Start Date End Date Taking? Authorizing Provider  acetaminophen  (TYLENOL )  325 MG tablet Take 2 tablets (650 mg total) by mouth every 6 (six) hours as needed (as needed for pain). 07/20/24  Yes Arrien, Elidia Sieving, MD  apixaban  (ELIQUIS ) 2.5 MG TABS tablet Take 1 tablet (2.5 mg total) by mouth 2 (two) times daily. 07/04/23  Yes Okey Moccasin  V, MD  atenolol  (TENORMIN ) 25 MG tablet Take 1 tablet (25 mg total) by mouth 2 (two) times daily. 08/22/24 11/20/24 Yes Maranda [name] MATSU, MD  b complex vitamins tablet Take 1 tablet by mouth every evening.   Yes [provider]  ceFAZolin  (ANCEF ) IVPB Inject 2 g into the vein every 8 (eight) hours for 8 days. Indication:  MSSA bacteremia  First Dose: Yes Last Day of Therapy:  08/28/2024 Labs - Once weekly:  CBC/D and BMP, Labs - Once weekly: ESR and CRP Method of administration: IV Push Method of administration may be changed at the discretion of home infusion pharmacist based upon assessment of the patient and/or caregiver's ability to self-administer the medication ordered. 08/20/24 08/28/24 Yes Vann, Jessica U, DO  Cholecalciferol (VITAMIN D-3 PO) Take 1 capsule by mouth daily.   Yes [provider]  Coenzyme Q10 (COQ-10 PO) Take 1 capsule by mouth every evening.   Yes [provider]  ezetimibe  (ZETIA ) 10 MG tablet Take 1 tablet (10 mg total) by mouth daily. Patient taking differently: Take 10 mg by mouth every evening. 05/21/24  Yes Conte, Tessa N, PA-C  folic acid  (FOLVITE ) 400 MCG tablet Take 400-800 mcg by mouth daily.   Yes [provider]  furosemide  (LASIX ) 40 MG tablet Take 40 mg by mouth every other morning 08/22/24 11/20/24 Yes Maranda [name] MATSU, MD  guaiFENesin  (ROBITUSSIN) 100 MG/5ML liquid Take 5 mLs by mouth every 4 (four) hours as needed for cough or to loosen phlegm. 08/22/24  Yes Maranda [name] MATSU, MD  KRILL OIL PO Take 1 capsule by mouth daily.   Yes [provider]  liver oil-zinc  oxide (DESITIN) 40 % ointment Apply 1 Application topically 2 (two) times daily.  08/22/24  Yes Maranda [name] MATSU, MD  Multiple Vitamin (MULTIVITAMIN WITH MINERALS) TABS tablet Take 1 tablet by mouth daily. 07/21/24  Yes Arrien, Elidia Sieving, MD  rosuvastatin  (CRESTOR ) 5 MG tablet Take 5 mg by mouth daily.   Yes [provider]  tamsulosin  (FLOMAX ) 0.4 MG CAPS capsule Take 1 capsule (0.4 mg total) by mouth every evening. 08/22/24 11/20/24 Yes Maranda [name] MATSU, MD  thiamine  (VITAMIN B1) 100 MG tablet Take 1 tablet (100 mg total) by mouth daily. 08/23/24 11/21/24 Yes Maranda [name] MATSU, MD  traZODone  (DESYREL ) 50 MG tablet Take 0.5 tablets (25 mg total) by mouth at bedtime as needed for sleep. 08/22/24 11/20/24 Yes Maranda [name] MATSU, MD  albuterol  (PROVENTIL ) (2.5 MG/3ML) 0.083% nebulizer solution Take 3 mLs (2.5 mg total) by nebulization every 4 (four) hours as needed for wheezing. Patient not taking: Reported on 08/25/2024 08/22/24 11/20/24  Maranda [name] MATSU, MD  fluticasone  (FLONASE ) 50 MCG/ACT nasal spray Place 1 spray into both nostrils 2 (two) times daily as needed for allergies or rhinitis. Patient not taking: Reported on 08/25/2024    [provider]  levocetirizine (XYZAL ) 5 MG tablet Take 5 mg by mouth See admin instructions. Take 5 mg by mouth in the evening as needed for allergies Patient not taking: Reported on 08/25/2024    [provider]  potassium chloride  (KLOR-CON ) 10 MEQ tablet Take 10 mEq by mouth every other day.    [provider]     Critical care time: 55              [1] No Known Allergies  "

## 2024-08-25 NOTE — ED Notes (Signed)
 Wickline made aware of pt. BP. Fluids order, will continue to monitor

## 2024-08-25 NOTE — ED Triage Notes (Signed)
 Pt BIB [facility] from home due to hypoxic delirium episode while sleeping tonight. Per EMS, pt was wears BIPAP nightly, but tonight when he went to sleep he was not wearing it and when he woke he was delirious and lucid. Pt does not wear 02 at home.  Pt also has a PICC line in R arm that is treating bacteriemia with Anceph. Pt cut part of PICC line tubing when he woke up.   Hx: hypoxia, right sided paralyzed diaphragm    EMS vitals  138/101  95% 4 liters nasal cannula  25-35 C02    Cbg 121

## 2024-08-25 NOTE — Consult Note (Addendum)
" °  CLINICAL SUPPORT TEAM - WOUND OSTOMY AND CONTINENCE TEAM  CONSULTATION SERVICES   WOC Nurse-Inpatient Note  WOC Nurse Consult Note: this patient is known to [facility] team from previous admission with DTPI sacrum extending to B ischium; also R lower leg full thickness followed weekly at [facility] (last seen at [facility] 1/16 using silver weekly)  Patient did have ABIs performed last admission and vessels noncompressible  Reason for Consult:  buttocks and R lower leg wounds  Wound type: 1.  Deep Tissue Pressure Injury medial buttocks extending down to B ischium; purple maroon discoloration some evolving with areas of pink moist tissue exposed 2.  Full thickness R lower leg 2 separate punched out areas in appearance separated by intact skin; red moist some tan tissue noted   Pressure Injury POA: Yes Measurement: see nursing flowsheet  Wound bed: as above; buttocks also noted with scaly peeling skin likely some component moisture and friction as well as pressure ? Fungal component  Drainage (amount, consistency, odor) see nursing flowsheet  Periwound: skin around leg wound hyperkeratotic dry  Dressing procedure/placement/frequency: Cleanse B buttocks and ischium with soap and water, dry.  Apply Gerhardt's Butt Cream to buttocks 2 times daily and prn soiling.  Apply Xeroform gauze ([id number]/[facility] #[id number]) to B ischial/posterior thigh wounds daily and secure with silicone foam or ABD pad and tape whichever is preferred.  Cleanse R lower leg wound with NS, apply silver hydrofiber to wound beds daily and secure with silicone foam.  Will write for daily Eucerin to surrounding hyperkeratotic skin.  Would wrap leg with Kerlix beginning right above toes and ending right below knees then apply Ace bandage wrapped in same fashion for light compression.  POC discussed with bedside nurse.  WOC team will not follow. Reconsult if further needs arise.   Thank you,    Myanna Ziesmer MSN, RN-BC, CWOCN     "

## 2024-08-25 NOTE — Progress Notes (Signed)
 Patient removed from bipap and placed on 10L [place] high flow nasal cannula.  Tolerating well at this time.  Will continue to monitor.

## 2024-08-25 NOTE — ED Provider Notes (Signed)
 " [place] [facility] Provider Note   CSN: [id number] Arrival date & time: 08/25/24  0214    Level 5 caveat due to acuity of condition Patient presents with: Altered Mental Status and Vascular Access Problem   [name] is a 74 y.o. adult.   The history is provided by the patient and a relative.   Patient with extensive history including chronic respiratory failure, inclusion body myositis, CAD, vascular disease, VTE on Eliquis , recent admission for hypovolemic/septic shock due to bacteremia, currently on home IV antibiotics with PICC line in place presents for altered mental status.  Family reports they checked on patient tonight and he was agitated and appeared to be having delirium possibly due to hypoxia.  Patient is post be wearing BiPAP but was not wearing it tonight. After applying oxygen patient appeared to be improved.  They are also concerned that his PICC line was dislodged  Patient is awake and alert this time, but does report feeling short of breath Past Medical History:  Diagnosis Date   Adenomatous colon polyp - diminutive 06/2019   no recall needed   Allergy sinus   hay fever   Bilateral lower extremity edema    BPH with lower urinary tract symptoms without urinary obstruction    Cataract    Coronary artery disease    a. s/p BMS to LAD (1998) b. s/p DES to RCA (06/24/14)   DVT (deep venous thrombosis) (HCC)    12/2020 L femoral and poplitea.  Unprovoked.  hypercoag w/u NEG.   Eliquis .   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 ?inclusion body myositis? Power WC as of 2022   Macrocytic anemia    Unknown etiology (bone marrow biopsy benign 2025)    Osteoarthritis, multiple sites    Palatal fistula    Palpable abd. aorta    Aortic u/s NO ANEURISM 2020   RBBB    Spinal stenosis    Dr Royal Bury  surgery   Thrombocytopenia    Uknown etiology (Bone marrow biopsy benign 2025)    Prior to Admission medications  Medication Sig Start Date End Date Taking? Authorizing Provider  acetaminophen  (TYLENOL ) 325 MG tablet Take 2 tablets (650 mg total) by mouth every 6 (six) hours as needed (as needed for pain). 07/20/24  Yes Arrien, Elidia Sieving, MD  apixaban  (ELIQUIS ) 2.5 MG TABS tablet Take 1 tablet (2.5 mg total) by mouth 2 (two) times daily. 07/04/23  Yes Okey Vina GAILS, MD  atenolol  (TENORMIN ) 25 MG tablet Take 1 tablet (25 mg total) by mouth 2 (two) times daily. 08/22/24 11/20/24 Yes Maranda Lonni MATSU, MD  b complex vitamins tablet Take 1 tablet by mouth every evening.   Yes [provider]  ceFAZolin  (ANCEF ) IVPB Inject 2 g into the vein every 8 (eight) hours for 8 days. Indication:  MSSA bacteremia  First Dose: Yes Last Day of Therapy:  08/28/2024 Labs - Once weekly:  CBC/D and BMP, Labs - Once weekly: ESR and CRP Method of administration: IV Push Method of administration may be changed at the discretion of home infusion pharmacist based  upon assessment of the patient and/or caregiver's ability to self-administer the medication ordered. 08/20/24 08/28/24 Yes Vann, Jessica U, DO  Cholecalciferol (VITAMIN D-3 PO) Take 1 capsule by mouth daily.   Yes [provider]  Coenzyme Q10 (COQ-10 PO) Take 1 capsule by mouth every evening.   Yes [provider]  ezetimibe  (ZETIA ) 10 MG tablet Take 1 tablet (10 mg total) by mouth daily. Patient taking differently: Take 10 mg by mouth every evening. 05/21/24  Yes Conte, Tessa N, PA-C  folic acid  (FOLVITE ) 400 MCG tablet Take 400-800 mcg by mouth daily.   Yes [provider]  furosemide  (LASIX ) 40 MG tablet Take 40 mg by mouth every other morning 08/22/24 11/20/24 Yes  Maranda Lonni MATSU, MD  guaiFENesin  (ROBITUSSIN) 100 MG/5ML liquid Take 5 mLs by mouth every 4 (four) hours as needed for cough or to loosen phlegm. 08/22/24  Yes Maranda Lonni MATSU, MD  KRILL OIL PO Take 1 capsule by mouth daily.   Yes [provider]  liver oil-zinc  oxide (DESITIN) 40 % ointment Apply 1 Application topically 2 (two) times daily. 08/22/24  Yes Maranda Lonni MATSU, MD  Multiple Vitamin (MULTIVITAMIN WITH MINERALS) TABS tablet Take 1 tablet by mouth daily. 07/21/24  Yes Arrien, Elidia Sieving, MD  rosuvastatin  (CRESTOR ) 5 MG tablet Take 5 mg by mouth daily.   Yes [provider]  tamsulosin  (FLOMAX ) 0.4 MG CAPS capsule Take 1 capsule (0.4 mg total) by mouth every evening. 08/22/24 11/20/24 Yes Maranda Lonni MATSU, MD  thiamine  (VITAMIN B1) 100 MG tablet Take 1 tablet (100 mg total) by mouth daily. 08/23/24 11/21/24 Yes Maranda Lonni MATSU, MD  traZODone  (DESYREL ) 50 MG tablet Take 0.5 tablets (25 mg total) by mouth at bedtime as needed for sleep. 08/22/24 11/20/24 Yes Maranda Lonni MATSU, MD  albuterol  (PROVENTIL ) (2.5 MG/3ML) 0.083% nebulizer solution Take 3 mLs (2.5 mg total) by nebulization every 4 (four) hours as needed for wheezing. Patient not taking: Reported on 08/25/2024 08/22/24 11/20/24  Maranda Lonni MATSU, MD  fluticasone  (FLONASE ) 50 MCG/ACT nasal spray Place 1 spray into both nostrils 2 (two) times daily as needed for allergies or rhinitis. Patient not taking: Reported on 08/25/2024    [provider]  levocetirizine (XYZAL ) 5 MG tablet Take 5 mg by mouth See admin instructions. Take 5 mg by mouth in the evening as needed for allergies Patient not taking: Reported on 08/25/2024    [provider]  potassium chloride  (KLOR-CON ) 10 MEQ tablet Take 10 mEq by mouth every other day.    [provider]    Allergies: Patient has no known allergies.    Review of Systems  Respiratory:  Positive for shortness of breath.    Psychiatric/Behavioral:  Positive for agitation.     Updated Vital Signs BP (!) 181/156   Pulse 80   Temp 98.3 F (36.8 C) (Oral)   Resp (!) 22   Ht 1.803 m (5' 11)   Wt 120 kg   SpO2 100%   BMI 36.90 kg/m   Physical Exam CONSTITUTIONAL elderly, ill-appearing HEAD: Normocephalic/atraumatic EYES: EOMI/PERRL ENMT: Mucous membranes moist NECK: supple no meningeal signs CV: S1/S2 noted, no murmurs/rubs/gallops noted LUNGS: Tachypneic, decreased breath sounds on the right Currently on 4 L nasal cannula ABDOMEN: soft, nontender NEURO: Pt is awake/alert/appropriate, moves all extremitiesx4.  No facial droop.   EXTREMITIES: Lower extremities are wrapped PICC line in place in right upper extremity, flushes without difficulty SKIN: warm, color normal PSYCH: Mildly anxious  (  all labs ordered are listed, but only abnormal results are displayed) Labs Reviewed  CBC - Abnormal; Notable for the following components:      Result Value   RBC 2.92 (*)    Hemoglobin 10.2 (*)    HCT 31.1 (*)    MCV 106.5 (*)    MCH 34.9 (*)    Platelets 129 (*)    All other components within normal limits  BASIC METABOLIC PANEL WITH GFR - Abnormal; Notable for the following components:   Chloride 94 (*)    BUN 34 (*)    Creatinine, Ser 2.81 (*)    Calcium  8.6 (*)    GFR, Estimated 23 (*)    All other components within normal limits  PRO BRAIN NATRIURETIC PEPTIDE - Abnormal; Notable for the following components:   Pro Brain Natriuretic Peptide 1,825.0 (*)    All other components within normal limits  PROTIME-INR - Abnormal; Notable for the following components:   Prothrombin Time 22.9 (*)    INR 1.9 (*)    All other components within normal limits  I-STAT VENOUS BLOOD GAS, ED - Abnormal; Notable for the following components:   pCO2, Ven 60.4 (*)    pO2, Ven 48 (*)    Bicarbonate 33.6 (*)    TCO2 35 (*)    Acid-Base Excess 7.0 (*)    Calcium , Ion 1.11 (*)    HCT 32.0 (*)    Hemoglobin 10.9  (*)    All other components within normal limits  CULTURE, BLOOD (SINGLE)  I-STAT CG4 LACTIC ACID, ED    EKG: EKG Interpretation Date/Time:  Saturday August 25 2024 04:05:38 EST Ventricular Rate:  70 PR Interval:  222 QRS Duration:  161 QT Interval:  455 QTC Calculation: 491 R Axis:   -16  Text Interpretation: Sinus rhythm Prolonged PR interval Right bundle branch block No significant change since last tracing Confirmed by Midge Golas ([id number]) on 08/25/2024 4:16:42 AM Prehospital EKG shows sinus rhythm and right bundle branch block Radiology: No results found.   .Critical Care  Performed by: Midge Golas, MD Authorized by: Midge Golas, MD   Critical care provider statement:    Critical care time (minutes):  60   Critical care start time:  08/25/2024 3:15 AM   Critical care end time:  08/25/2024 4:15 AM   Critical care time was exclusive of:  Separately billable procedures and treating other patients   Critical care was necessary to treat or prevent imminent or life-threatening deterioration of the following conditions:  Respiratory failure, sepsis and shock   Critical care was time spent personally by me on the following activities:  Obtaining history from patient or surrogate, examination of patient, evaluation of patient's response to treatment, development of treatment plan with patient or surrogate, pulse oximetry, ordering and review of radiographic studies, ordering and performing treatments and interventions, ordering and review of laboratory studies, re-evaluation of patient's condition and review of old charts   I assumed direction of critical care for this patient from another provider in my specialty: no     Care discussed with: admitting provider      Medications Ordered in the ED  lactated ringers  infusion (has no administration in time range)  lactated ringers  bolus 1,000 mL (0 mLs Intravenous Stopped 08/25/24 0548)    And  lactated ringers  bolus 1,000  mL (has no administration in time range)    And  lactated ringers  bolus 1,000 mL (has no administration in time range)  And  lactated ringers  bolus 1,000 mL (has no administration in time range)  metroNIDAZOLE  (FLAGYL ) IVPB 500 mg (has no administration in time range)  vancomycin  (VANCOREADY) IVPB 2000 mg/400 mL (has no administration in time range)  furosemide  (LASIX ) injection 40 mg (has no administration in time range)  sodium chloride  0.9 % bolus 1,000 mL (0 mLs Intravenous Stopped 08/25/24 0405)  ceFEPIme  (MAXIPIME ) 2 g in sodium chloride  0.9 % 100 mL IVPB (0 g Intravenous Stopped 08/25/24 0550)    Clinical Course as of 08/25/24 0600  Sat Aug 25, 2024  0315 Patient presents from home for delirium that was felt to be due to hypoxia. Patient's mental status is improved, he is on nasal cannula, but appears to be short of breath Patient is now hypotensive He just got out of the hospital after episodes of shock/sepsis  [DW]  0406 Vital signs appear to be improving.  His work of breathing is improved Lactate negative Son in law confirms the patient last had a dose of Ancef  around 2300 last night [DW]  0420 Creatinine(!): 2.81 Acute kidney injury [DW]  0420 Hemoglobin(!): 10.2 Anemia [DW]  0502 Although patient's respiratory status is improving, his blood pressure is now starting to drop Code sepsis has been enacted.  IV fluid antibiotics given ordered Will consult critical care [DW]  0557 D/w critical care, dr claudene, will evaluate patient [DW]    Clinical Course User Index [DW] Midge Golas, MD                                 Medical Decision Making Amount and/or Complexity of Data Reviewed Labs: ordered. Decision-making details documented in ED Course. Radiology: ordered.  Risk Prescription drug management. Decision regarding hospitalization.   This patient presents to the ED for concern of shortness of breath, this involves an extensive number of treatment options,  and is a complaint that carries with it a high risk of complications and morbidity.  The differential diagnosis includes but is not limited to Acute coronary syndrome, pneumonia, acute pulmonary edema, pneumothorax, acute anemia, pulmonary embolism    Comorbidities that complicate the patient evaluation: Patients presentation is complicated by their history of myositis, chronic respiratory failure  Social Determinants of Health: Patients poor mobility, recent admission  increases the complexity of managing their presentation  Additional history obtained: Additional history obtained from family Records reviewed previous admission documents  Lab Tests: I Ordered, and personally interpreted labs.  The pertinent results include: Chronic hypercapnia  Imaging Studies ordered: I ordered imaging studies including X-ray chest  I independently visualized and interpreted imaging which showed Elevated right hemidiaphragm I agree with the radiologist interpretation  Cardiac Monitoring: The patient was maintained on a cardiac monitor.  I personally viewed and interpreted the cardiac monitor which showed an underlying rhythm of:  sinus rhythm  Medicines ordered and prescription drug management: I ordered medication including IV fluids for AKI, sepsis Reevaluation of the patient after these medicines showed that the patient    stayed the same  Critical Interventions:   IV fluids, antibiotics ordered  Consultations Obtained: I requested consultation with the admitting physician critical care, and discussed  findings as well as pertinent plan - they recommend: Will evaluate the patient  Reevaluation: After the interventions noted above, I reevaluated the patient and found that they have :improved  Complexity of problems addressed: Patients presentation is most consistent with  acute presentation with potential threat to  life or bodily function  Disposition: After consideration of the  diagnostic results and the patients response to treatment,  I feel that the patent would benefit from admission  .        Final diagnoses:  Delirium  AKI (acute kidney injury)    ED Discharge Orders     None          Midge Golas, MD 08/25/24 0600  "

## 2024-08-25 NOTE — Sepsis Progress Note (Signed)
 Elink monitoring for the code sepsis protocol.

## 2024-08-25 NOTE — Progress Notes (Signed)
 Pt taken from ED29 to 2M06 by RN and RT w/o complications

## 2024-08-25 NOTE — Progress Notes (Signed)
 PHARMACY - ANTICOAGULATION CONSULT NOTE  Pharmacy Consult for heparin   Indication: VTE treatment   Allergies[1]  Patient Measurements: Height: 5' 11 (180.3 cm) Weight: 120 kg (264 lb 8.8 oz) IBW/kg (Calculated) : 75.3 HEPARIN  DW (KG): 101.9  Vital Signs: Temp: 96.5 F (35.8 C) (01/31 1522) Temp Source: Axillary (01/31 1522) BP: 125/51 (01/31 1505) Pulse Rate: 64 (01/31 1505)  Labs: Recent Labs    08/25/24 0325 08/25/24 0344 08/25/24 1200 08/25/24 1600  HGB 10.2* 10.9*  --   --   HCT 31.1* 32.0*  --   --   PLT 129*  --   --   --   APTT  --   --   --  52*  LABPROT 22.9*  --   --   --   INR 1.9*  --   --   --   CREATININE 2.81*  --  2.44*  --     Estimated Creatinine Clearance (by C-G formula based on SCr of 2.44 mg/dL (H)) Male: 28 mL/min (A) Male: 34 mL/min (A)   Assessment: 41 yoM presented with AMS/hypoxia. Pharmacy consulted to dose heparin  for hx of VTE. -Apixaban  last dose: 1/30 ~1900 -Will utilize aPTTs given heparin  level will be falsely elevated  aPTT 52 sec on infusion at 1600 units/hr. No heparin  level drawn yet but should be elevated due to recent apixaban  use. No issues with line or bleeding reported per RN.  Goal of Therapy:  Heparin  level 0.3-0.7 units/ml aPTT 66-102 seconds Monitor platelets by anticoagulation protocol: Yes   Plan:  Increase heparin  infusion to 1800 units/hr Check aPTT and heparin  level in 8 hours  Vito Ralph, PharmD, BCPS Please see amion for complete clinical pharmacist phone list 08/25/2024 5:01 PM         [1] No Known Allergies

## 2024-08-25 NOTE — Progress Notes (Signed)
" °   08/25/24 2143  BiPAP/CPAP/SIPAP  $ Non-Invasive Home Ventilator  Subsequent  $ Face Mask Medium (S)  Yes  BiPAP/CPAP/SIPAP Pt Type Adult  BiPAP/CPAP/SIPAP SERVO  Mask Type Full face mask  Mask Size Medium  Set Rate 10 breaths/min  Respiratory Rate 24 breaths/min  IPAP 18 cmH20  EPAP 8 cmH2O  PEEP 8 cmH20  FiO2 (%) 40 %  Minute Ventilation 17.1  Leak 32  Peak Inspiratory Pressure (PIP) 18  Tidal Volume (Vt) 655  Patient Home Machine No  Patient Home Mask No  Patient Home Tubing No  Auto Titrate No  Press High Alarm 25 cmH2O  Press Low Alarm 5 cmH2O  BiPAP/CPAP /SiPAP Vitals  Pulse Rate 64  Resp (!) 33  SpO2 100 %  MEWS Score/Color  MEWS Score 4  MEWS Score Color Red     Placed on BIPAP for the night.  "

## 2024-08-25 NOTE — ED Notes (Signed)
 ED Provider at bedside.

## 2024-08-25 NOTE — Progress Notes (Signed)
 PHARMACY - ANTICOAGULATION CONSULT NOTE  Pharmacy Consult for heparin   Indication: VTE treatment   Allergies[1]  Patient Measurements: Height: 5' 11 (180.3 cm) Weight: 120 kg (264 lb 8.8 oz) IBW/kg (Calculated) : 75.3 HEPARIN  DW (KG): 101.9  Vital Signs: Temp: 96.5 F (35.8 C) (01/31 2033) Temp Source: Oral (01/31 2033) BP: 122/49 (01/31 2300) Pulse Rate: 57 (01/31 2300)  Labs: Recent Labs    08/25/24 0325 08/25/24 0344 08/25/24 1200 08/25/24 1600 08/25/24 2213  HGB 10.2* 10.9*  --   --   --   HCT 31.1* 32.0*  --   --   --   PLT 129*  --   --   --   --   APTT  --   --   --  52* 61*  LABPROT 22.9*  --   --   --   --   INR 1.9*  --   --   --   --   HEPARINUNFRC  --   --   --  >1.10*  --   CREATININE 2.81*  --  2.44*  --   --     Estimated Creatinine Clearance (by C-G formula based on SCr of 2.44 mg/dL (H)) Male: 28 mL/min (A) Male: 34 mL/min (A)   Assessment: 35 yoM presented with AMS/hypoxia. Pharmacy consulted to dose heparin  for hx of VTE. -Apixaban  last dose: 1/30 ~1900 -Will utilize aPTTs given heparin  level will be falsely elevated  aPTT 61 sec on infusion at 1800 units/hr (subtherapeutic). No issues with line or bleeding reported per RN.  Goal of Therapy:  Heparin  level 0.3-0.7 units/ml aPTT 66-102 seconds Monitor platelets by anticoagulation protocol: Yes   Plan:  Increase heparin  infusion to 2000 units/hr Check aPTT in 8 hours Monitor with aPTT levels until correlates with heparin  level CBC daily  Rutha Poplar, PharmD, BCPS Clinical Pharmacist 08/25/2024 11:12 PM      [1] No Known Allergies

## 2024-08-25 NOTE — ED Notes (Signed)
 Hospitalist at bedside , states to hold all fluid but give antibiotics

## 2024-08-25 NOTE — ED Notes (Signed)
 Pt. Taken off high flow and placed on Bipap

## 2024-08-26 ENCOUNTER — Inpatient Hospital Stay (HOSPITAL_COMMUNITY)

## 2024-08-26 DIAGNOSIS — N179 Acute kidney failure, unspecified: Secondary | ICD-10-CM | POA: Diagnosis not present

## 2024-08-26 DIAGNOSIS — J9622 Acute and chronic respiratory failure with hypercapnia: Secondary | ICD-10-CM | POA: Diagnosis not present

## 2024-08-26 DIAGNOSIS — J9621 Acute and chronic respiratory failure with hypoxia: Secondary | ICD-10-CM

## 2024-08-26 DIAGNOSIS — B9561 Methicillin susceptible Staphylococcus aureus infection as the cause of diseases classified elsewhere: Secondary | ICD-10-CM | POA: Diagnosis not present

## 2024-08-26 DIAGNOSIS — G4733 Obstructive sleep apnea (adult) (pediatric): Secondary | ICD-10-CM

## 2024-08-26 DIAGNOSIS — I959 Hypotension, unspecified: Secondary | ICD-10-CM

## 2024-08-26 LAB — APTT: aPTT: 80 s — ABNORMAL HIGH (ref 24–36)

## 2024-08-26 LAB — CBC
HCT: 25.1 % — ABNORMAL LOW (ref 39.0–52.0)
Hemoglobin: 8.2 g/dL — ABNORMAL LOW (ref 13.0–17.0)
MCH: 35.2 pg — ABNORMAL HIGH (ref 26.0–34.0)
MCHC: 32.7 g/dL (ref 30.0–36.0)
MCV: 107.7 fL — ABNORMAL HIGH (ref 80.0–100.0)
Platelets: 109 10*3/uL — ABNORMAL LOW (ref 150–400)
RBC: 2.33 MIL/uL — ABNORMAL LOW (ref 4.22–5.81)
RDW: 13.9 % (ref 11.5–15.5)
WBC: 7.7 10*3/uL (ref 4.0–10.5)
nRBC: 0 % (ref 0.0–0.2)

## 2024-08-26 LAB — BASIC METABOLIC PANEL WITH GFR
Anion gap: 8 (ref 5–15)
BUN: 32 mg/dL — ABNORMAL HIGH (ref 8–23)
CO2: 33 mmol/L — ABNORMAL HIGH (ref 22–32)
Calcium: 8.2 mg/dL — ABNORMAL LOW (ref 8.9–10.3)
Chloride: 100 mmol/L (ref 98–111)
Creatinine, Ser: 1.76 mg/dL — ABNORMAL HIGH (ref 0.61–1.24)
GFR, Estimated: 40 mL/min — ABNORMAL LOW
Glucose, Bld: 96 mg/dL (ref 70–99)
Potassium: 3.5 mmol/L (ref 3.5–5.1)
Sodium: 141 mmol/L (ref 135–145)

## 2024-08-26 LAB — GLUCOSE, CAPILLARY
Glucose-Capillary: 71 mg/dL (ref 70–99)
Glucose-Capillary: 74 mg/dL (ref 70–99)
Glucose-Capillary: 77 mg/dL (ref 70–99)
Glucose-Capillary: 85 mg/dL (ref 70–99)
Glucose-Capillary: 88 mg/dL (ref 70–99)
Glucose-Capillary: 97 mg/dL (ref 70–99)
Glucose-Capillary: 99 mg/dL (ref 70–99)

## 2024-08-26 LAB — HEPARIN LEVEL (UNFRACTIONATED): Heparin Unfractionated: >1.1 [IU]/mL — ABNORMAL HIGH (ref 0.30–0.70)

## 2024-08-26 LAB — MAGNESIUM: Magnesium: 1.9 mg/dL (ref 1.7–2.4)

## 2024-08-26 LAB — PHOSPHORUS: Phosphorus: 3.3 mg/dL (ref 2.5–4.6)

## 2024-08-26 MED ORDER — MIDODRINE HCL 5 MG PO TABS
5.0000 mg | ORAL_TABLET | Freq: Three times a day (TID) | ORAL | Status: DC
  Administered 2024-08-26 – 2024-08-29 (×9): 5 mg via ORAL
  Filled 2024-08-26 (×9): qty 1

## 2024-08-26 MED ORDER — THIAMINE MONONITRATE 100 MG PO TABS
100.0000 mg | ORAL_TABLET | Freq: Every day | ORAL | Status: AC
  Administered 2024-08-26 – 2024-08-31 (×5): 100 mg via ORAL
  Filled 2024-08-26 (×5): qty 1

## 2024-08-26 MED ORDER — TAMSULOSIN HCL 0.4 MG PO CAPS
0.4000 mg | ORAL_CAPSULE | Freq: Every day | ORAL | Status: AC
  Administered 2024-08-26 – 2024-08-31 (×6): 0.4 mg via ORAL
  Filled 2024-08-26 (×6): qty 1

## 2024-08-26 MED ORDER — TORSEMIDE 20 MG PO TABS
20.0000 mg | ORAL_TABLET | Freq: Every day | ORAL | Status: DC
  Administered 2024-08-27 – 2024-08-28 (×2): 20 mg via ORAL
  Filled 2024-08-26 (×3): qty 1

## 2024-08-26 MED ORDER — ORAL CARE MOUTH RINSE: 15.0000 mL | OROMUCOSAL | Status: AC | PRN

## 2024-08-26 MED ORDER — ROSUVASTATIN CALCIUM 5 MG PO TABS
5.0000 mg | ORAL_TABLET | Freq: Every day | ORAL | Status: AC
  Administered 2024-08-26 – 2024-08-31 (×6): 5 mg via ORAL
  Filled 2024-08-26 (×6): qty 1

## 2024-08-26 MED ORDER — EZETIMIBE 10 MG PO TABS
10.0000 mg | ORAL_TABLET | Freq: Every day | ORAL | Status: AC
  Administered 2024-08-26 – 2024-08-31 (×6): 10 mg via ORAL
  Filled 2024-08-26 (×7): qty 1

## 2024-08-26 MED ORDER — FOLIC ACID 1 MG PO TABS
1.0000 mg | ORAL_TABLET | Freq: Every day | ORAL | Status: AC
  Administered 2024-08-26 – 2024-08-31 (×5): 1 mg via ORAL
  Filled 2024-08-26 (×4): qty 1

## 2024-08-26 MED ORDER — PHENOL 1.4 % MT LIQD: 1.0000 | OROMUCOSAL | Status: AC | PRN

## 2024-08-26 MED ORDER — APIXABAN 5 MG PO TABS
5.0000 mg | ORAL_TABLET | Freq: Two times a day (BID) | ORAL | Status: DC
  Administered 2024-08-26 – 2024-08-27 (×4): 5 mg via ORAL
  Filled 2024-08-26 (×4): qty 1

## 2024-08-26 NOTE — Consult Note (Signed)
 [facility] Renal Consultation Note  Requesting MD:  Harden Staff, MD Indication for Consultation:  AKI   Chief complaint: shortness of breath   HPI:  [name] is a 74 y.o. 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  He was also found to be hypotensive.  He is a somewhat poor historian which may be influenced by being extremely hard of hearing; he does have his hearing aids in.  He was admitted by critical care and initially diuresed.  His course was complicated by acute kidney injury which is now resolving.  Nephrology is consulted for critical care as they indicate this is recurrent AKI and would like additional management recommendations to prevent from happening again. Note recent admission with AKI on 1/19 with Cr 3.14 which had resolved to 0.61 on 1/27; note also another admission with AKI in December.  His home lasix  dose is reported as being 40 mg every other day.  He was previously on midodrine  during his recent admission and per charting this was discontinued due to reflex bradycardia. Previous treating provider felt AKI was in the setting of septic shock requiring levophed .  His most recent diuretic dose per the 08/13/24 H&P was torsemide  20 mg daily.  His atenolol  dose was charted as having been decreased to avoid hypotension.  He had 950 mL UOP over 1/31 charted with an additional 800 mL UOP over 2/1 charted.  Per last abdominal imaging 07/09/2024, he had new moderate right-sided hydronephrosis as well as echogenic bladder lesion with additional imaging recommended.  Urology ordered a CT stone study and this demonstrated no hydronephrosis or obstructing urinary tract calculus; also noted were bilateral non-obstructing renal calculi.  He provides variable history as to  whether he has difficulty urinating - states he hasn't been making as much urine today.  Sometimes short of breath when they lie him back.     Creatinine, Ser  Date/Time Value Ref Range Status  08/26/2024 04:55 AM 1.76 (H) 0.61 - 1.24 mg/dL Final  98/68/7973 87:99 PM 2.44 (H) 0.61 - 1.24 mg/dL Final  98/68/7973 96:74 AM 2.81 (H) 0.61 - 1.24 mg/dL Final  98/72/7973 95:90 AM 0.61 0.61 - 1.24 mg/dL Final  98/73/7973 90:81 AM 0.67 0.61 - 1.24 mg/dL Final  98/74/7973 96:55 AM 0.57 (L) 0.61 - 1.24 mg/dL Final  98/76/7973 91:69 AM 0.59 (L) 0.61 - 1.24 mg/dL Final  98/77/7973 96:49 AM 0.87 0.61 - 1.24 mg/dL Final  98/78/7973 96:68 AM 1.72 (H) 0.61 - 1.24 mg/dL Final  98/79/7973 96:68 PM 2.35 (H) 0.61 - 1.24 mg/dL Final  98/79/7973 96:47 AM 2.67 (H) 0.61 - 1.24 mg/dL Final  98/80/7973 87:65 PM 3.14 (H) 0.61 - 1.24 mg/dL Final  87/73/7974 91:84 AM 0.64 0.61 - 1.24 mg/dL Final  87/74/7974 95:52 AM 0.63 0.61 - 1.24 mg/dL Final  87/75/7974 95:58 AM 0.65 0.61 - 1.24 mg/dL Final  87/76/7974 95:41 AM 0.69 0.61 - 1.24 mg/dL Final  87/76/7974 87:94 AM 0.69 0.61 - 1.24 mg/dL Final  87/77/7974 94:69 AM 0.65 0.61 - 1.24 mg/dL Final  87/78/7974 97:40 AM 0.74 0.61 - 1.24 mg/dL Final  87/79/7974 91:48 AM 0.84 0.61 - 1.24 mg/dL Final  87/80/7974 90:93 AM 1.21 0.61 - 1.24 mg/dL Final  87/81/7974 95:78 AM 1.85 (H) 0.61 - 1.24 mg/dL Final  87/82/7974 94:95 AM 1.46 (H)  0.61 - 1.24 mg/dL Final  87/83/7974 94:85 AM 1.48 (H) 0.61 - 1.24 mg/dL Final  87/84/7974 87:69 PM 1.98 (H) 0.61 - 1.24 mg/dL Final  90/83/7974 98:58 PM 1.16 0.76 - 1.27 mg/dL Final  91/70/7974 98:73 PM 0.86 0.76 - 1.27 mg/dL Final  92/71/7974 98:79 PM 1.34 (H) 0.76 - 1.27 mg/dL Final  94/80/7974 98:88 PM 0.82 0.40 - 1.50 mg/dL Final  87/97/7975 97:55 PM 1.06 0.76 - 1.27 mg/dL Final  88/95/7975 97:83 PM 0.91 0.76 - 1.27 mg/dL Final  89/89/7975 96:85 PM 0.88 0.76 - 1.27 mg/dL Final  90/88/7976 87:74 PM 0.84 0.76 - 1.27 mg/dL Final  93/91/7977  91:43 AM 0.91 0.76 - 1.27 mg/dL Final  92/69/7978 87:92 PM 1.08 0.76 - 1.27 mg/dL Final  87/89/7979 87:87 PM 1.01 0.76 - 1.27 mg/dL Final  89/98/7979 90:79 AM 1.04 0.76 - 1.27 mg/dL Final  90/70/7979 88:94 AM 1.30 (H) 0.76 - 1.27 mg/dL Final  90/85/7979 98:51 PM 1.10 0.76 - 1.27 mg/dL Final  94/87/7979 89:73 AM 0.95 0.76 - 1.27 mg/dL Final  87/83/7980 91:70 AM 0.81 0.61 - 1.24 mg/dL Final  90/73/7980 91:46 AM 0.97 0.76 - 1.27 mg/dL Final  94/93/7980 96:62 PM 1.17 0.76 - 1.27 mg/dL Final  93/95/7981 89:70 AM 0.97 0.76 - 1.27 mg/dL Final  91/69/7983 90:40 AM 0.94 0.40 - 1.50 mg/dL Final  87/98/7984 96:76 AM 0.76 0.50 - 1.35 mg/dL Final  88/72/7984 90:90 AM 0.9 0.4 - 1.5 mg/dL Final  93/87/7984 90:94 AM 1.0 0.4 - 1.5 mg/dL Final     PMHx:   Past Medical History:  Diagnosis Date   Adenomatous colon polyp - diminutive 06/2019   no recall needed   Allergy sinus   hay fever   Bilateral lower extremity edema    BPH with lower urinary tract symptoms without urinary obstruction    Cataract    Coronary artery disease    a. s/p BMS to LAD (1998) b. s/p DES to RCA (06/24/14)   DVT (deep venous thrombosis) (HCC)    12/2020 L femoral and poplitea.  Unprovoked.  hypercoag w/u NEG.   Eliquis .   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 ?inclusion body myositis? Power WC as of 2022   Macrocytic anemia    Unknown etiology (bone marrow biopsy benign 2025)   Osteoarthritis, multiple sites    Palatal fistula    Palpable abd. aorta    Aortic u/s NO ANEURISM 2020   RBBB    Spinal stenosis    Dr Royal Bury  surgery   Thrombocytopenia    Uknown etiology (Bone marrow biopsy benign 2025)    Past Surgical History:   Procedure Laterality Date   APPENDECTOMY  2010   BONE MARROW BIOPSY     2025 benign   CERVICAL FUSION  09/2009   CHOLECYSTECTOMY  1998   COLONOSCOPY     2020-->no recall   EYE SURGERY     Lasik   HYDROCELE EXCISION  2005   JOINT REPLACEMENT  05/22/20   right hip   LEFT HEART CATHETERIZATION WITH CORONARY ANGIOGRAM N/A 06/24/2014   Procedure: LEFT HEART CATHETERIZATION WITH CORONARY ANGIOGRAM;  Surgeon: Debby DELENA Sor, MD;  Location: [facility] CATH LAB;  Service: Cardiovascular;  Laterality: N/A;   LUMBAR SPINE SURGERY  11/2011   MUSCLE BIOPSY Left 07/11/2018   Procedure: Left Vastus lateralus muscle biopsy;  Surgeon: Cheryle Debby DELENA, MD;  Location: [facility] OR;  Service: Neurosurgery;  Laterality: Left;  Left Vastus lateralus muscle biopsy   PERCUTANEOUS CORONARY STENT INTERVENTION (PCI-S)  06/24/2014   DES to RCA. Procedure: PERCUTANEOUS CORONARY STENT INTERVENTION (PCI-S);  Surgeon: Debby DELENA Sor, MD;  Location: [facility] CATH LAB;  Service: Cardiovascular;;  Distal RCA   Rhythm monitoring     04/2023, occ pvc's   SPINE SURGERY     TONSILLECTOMY AND ADENOIDECTOMY  September 15, 2010   TOTAL HIP ARTHROPLASTY Right 09-16-2019   RIGHT   TRANSESOPHAGEAL ECHOCARDIOGRAM (CATH LAB) N/A 08/17/2024   Procedure: TRANSESOPHAGEAL ECHOCARDIOGRAM;  Surgeon: Francyne Headland, MD;  Location: [facility] INVASIVE CV LAB;  Service: Cardiovascular;  Laterality: N/A;   TRANSTHORACIC ECHOCARDIOGRAM     03/2019 normal.  Nov 2024 mild RV dysf, o/w normal    Family Hx:  Family History  Problem Relation Age of Onset   Heart attack Mother    Esophageal cancer Mother    Cancer Mother    Aneurysm Father    Heart disease Father    Early death Father    Cystic fibrosis Brother        no prior health issues, rare form of cystic fibrosis of the lungs 03-15-2025 09-15-14)   Early death Brother    Stroke Neg Hx   No family history of CKD  Social History:  reports that he quit smoking about 35 years ago. His smoking use included cigarettes. He started  smoking about 58 years ago. He has a 17.3 pack-year smoking history. He has never used smokeless tobacco. He reports current alcohol use. He reports that he does not use drugs.  Allergies: Allergies[1]  Medications: Prior to Admission medications  Medication Sig Start Date End Date Taking? Authorizing Provider  acetaminophen  (TYLENOL ) 325 MG tablet Take 2 tablets (650 mg total) by mouth every 6 (six) hours as needed (as needed for pain). 07/20/24  Yes Arrien, Elidia Sieving, MD  apixaban  (ELIQUIS ) 2.5 MG TABS tablet Take 1 tablet (2.5 mg total) by mouth 2 (two) times daily. 07/04/23  Yes Okey Vina GAILS, MD  atenolol  (TENORMIN ) 25 MG tablet Take 1 tablet (25 mg total) by mouth 2 (two) times daily. 08/22/24 11/20/24 Yes Maranda Lonni MATSU, MD  b complex vitamins tablet Take 1 tablet by mouth every evening.   Yes [provider]  ceFAZolin  (ANCEF ) IVPB Inject 2 g into the vein every 8 (eight) hours for 8 days. Indication:  MSSA bacteremia  First Dose: Yes Last Day of Therapy:  08/28/2024 Labs - Once weekly:  CBC/D and BMP, Labs - Once weekly: ESR and CRP Method of administration: IV Push Method of administration may be changed at the discretion of home infusion pharmacist based upon assessment of the patient and/or caregiver's ability to self-administer the medication ordered. 08/20/24 08/28/24 Yes Vann, Jessica U, DO  Cholecalciferol (VITAMIN D-3 PO) Take 1 capsule by mouth daily.   Yes [provider]  Coenzyme Q10 (COQ-10 PO) Take 1 capsule by mouth every evening.   Yes [provider]  ezetimibe  (ZETIA ) 10 MG tablet Take 1 tablet (10 mg total) by mouth daily. Patient taking differently: Take 10 mg by mouth every evening. 05/21/24  Yes Conte, Tessa N, PA-C  folic acid  (FOLVITE ) 400 MCG tablet  Take 400-800 mcg by mouth daily.   Yes [provider]  furosemide  (LASIX ) 40 MG tablet Take 40 mg by mouth every other morning 08/22/24 11/20/24 Yes Maranda Lonni MATSU, MD   guaiFENesin  (ROBITUSSIN) 100 MG/5ML liquid Take 5 mLs by mouth every 4 (four) hours as needed for cough or to loosen phlegm. 08/22/24  Yes Maranda Lonni MATSU, MD  KRILL OIL PO Take 1 capsule by mouth daily.   Yes [provider]  liver oil-zinc  oxide (DESITIN) 40 % ointment Apply 1 Application topically 2 (two) times daily. 08/22/24  Yes Maranda Lonni MATSU, MD  Multiple Vitamin (MULTIVITAMIN WITH MINERALS) TABS tablet Take 1 tablet by mouth daily. 07/21/24  Yes Arrien, Elidia Sieving, MD  rosuvastatin  (CRESTOR ) 5 MG tablet Take 5 mg by mouth daily.   Yes [provider]  tamsulosin  (FLOMAX ) 0.4 MG CAPS capsule Take 1 capsule (0.4 mg total) by mouth every evening. 08/22/24 11/20/24 Yes Maranda Lonni MATSU, MD  thiamine  (VITAMIN B1) 100 MG tablet Take 1 tablet (100 mg total) by mouth daily. 08/23/24 11/21/24 Yes Maranda Lonni MATSU, MD  traZODone  (DESYREL ) 50 MG tablet Take 0.5 tablets (25 mg total) by mouth at bedtime as needed for sleep. 08/22/24 11/20/24 Yes Maranda Lonni MATSU, MD  albuterol  (PROVENTIL ) (2.5 MG/3ML) 0.083% nebulizer solution Take 3 mLs (2.5 mg total) by nebulization every 4 (four) hours as needed for wheezing. Patient not taking: Reported on 08/25/2024 08/22/24 11/20/24  Maranda Lonni MATSU, MD  fluticasone  (FLONASE ) 50 MCG/ACT nasal spray Place 1 spray into both nostrils 2 (two) times daily as needed for allergies or rhinitis. Patient not taking: Reported on 08/25/2024    [provider]  levocetirizine (XYZAL ) 5 MG tablet Take 5 mg by mouth See admin instructions. Take 5 mg by mouth in the evening as needed for allergies Patient not taking: Reported on 08/25/2024    [provider]  potassium chloride  (KLOR-CON ) 10 MEQ tablet Take 10 mEq by mouth every other day.    [provider]   I have reviewed the patient's current and reported prior to admission medications.   Labs:     Latest Ref Rng & Units 08/26/2024    4:55 AM  08/25/2024   12:00 PM 08/25/2024    3:44 AM  BMP  Glucose 70 - 99 mg/dL 96  71    BUN 8 - 23 mg/dL 32  35    Creatinine 9.38 - 1.24 mg/dL 8.23  7.55    Sodium 864 - 145 mmol/L 141  140  138   Potassium 3.5 - 5.1 mmol/L 3.5  4.1  4.2   Chloride 98 - 111 mmol/L 100  99    CO2 22 - 32 mmol/L 33  31    Calcium  8.9 - 10.3 mg/dL 8.2  8.0      Urinalysis    Component Value Date/Time   COLORURINE YELLOW 08/14/2024 1021   APPEARANCEUR HAZY (A) 08/14/2024 1021   LABSPEC 1.012 08/14/2024 1021   PHURINE 5.0 08/14/2024 1021   GLUCOSEU NEGATIVE 08/14/2024 1021   HGBUR MODERATE (A) 08/14/2024 1021   BILIRUBINUR NEGATIVE 08/14/2024 1021   KETONESUR NEGATIVE 08/14/2024 1021   PROTEINUR 30 (A) 08/14/2024 1021   NITRITE NEGATIVE 08/14/2024 1021   LEUKOCYTESUR NEGATIVE 08/14/2024 1021     ROS:  Pertinent items noted in HPI and remainder of comprehensive ROS otherwise negative.  Physical Exam: Vitals:   08/26/24 1556 08/26/24 1600  BP:  106/65  Pulse:  61  Resp:  ROLLEN)  23  Temp: (!) 96.7 F (35.9 C)   SpO2:  100%     General: elderly male in bed, chronically ill-appearing   HEENT: NCAT Eyes: EOMI  Neck: supple trachea midline  Heart: S1S2 no rub Lungs: clear but reduced on auscultation; normal work of breathing on 4 liters oxygen Abdomen: soft/nt/obese Extremities: no pitting edema; extremities are wrapped Neuro: markedly hard of hearing; provides his name and is conversant  Psych: no anxiety or agitation  GU purewick in place    Assessment/Plan:  # AKI - Felt secondary to ATN in the setting of hypotension.  Also with diuretics to optimize chronic diastolic CHF and lower extremity edema complicated by leg wounds  - Avoid hypotension - Adjust diuretics as below - Update renal ultrasound - note recent admission with hydronephrosis which had resolved on the follow-up CT - possibly a passed stone? - Continue BPH regimen   # Chronic diastolic CHF  - Recently, diuretics appear  to have been adjusted from torsemide  20 mg daily to lasix  40 mg every other day  - Resume torsemide  20 mg daily - looks to be his discharge med from December d/c  # Hypotension - past hx of HTN and now with hypotension more recently - Per charting, recently with reflex bradycardia felt related to midodrine  - which he is now on again - would remain off of atenolol   # Acute on Chronic hypoxic and hypercapnic respiratory failure  - Continue oxygen per pulm - optimize volume status as above    Thank you for the consult.  Please do not hesitate to contact me with any questions regarding our patient.   Katheryn JAYSON Saba 08/26/2024, 6:52 PM        [1] No Known Allergies

## 2024-08-26 NOTE — TOC Initial Note (Signed)
 Transition of Care [facility]) - Initial/Assessment Note    Patient Details  Name: [name] MRN: [id number] Date of Birth: Aug 11, 1950  [facility] of [facility]) CM/SW Contact:    [name], LCSWA Phone Number: 08/26/2024, 10:31 AM  Clinical Narrative:    Patient was recently discharged on 08/22/2024. Patient is from home with family. Patient has PCP and insurance on file. [facility] for medications. Patient has DME hx: NIV/Bipap, hoyer lift, RW, cane, wheelchair, shower seat. Patient previously went home on IV abx through [facility] infusion. HH was setup through [facility] for HHRN/PT/OT.   Inpatient Care Management will continue to follow.  Expected Discharge Plan:  (TBD) Barriers to Discharge: Continued Medical Work up   Patient Goals and CMS Choice Patient states their goals for this hospitalization and ongoing recovery are:: Home          Expected Discharge Plan and Services       Living arrangements for the past 2 months: Single Family Home                                      Prior Living Arrangements/Services Living arrangements for the past 2 months: Single Family Home Lives with:: Relatives Patient language and need for interpreter reviewed:: Yes Do you feel safe going back to the place where you live?: Yes      Need for Family Participation in Patient Care: Yes (Comment) Care giver support system in place?: Yes (comment)   Criminal Activity/Legal Involvement Pertinent to Current Situation/Hospitalization: No - Comment as needed  Activities of Daily Living      Permission Sought/Granted Permission sought to share information with : Family Supports Permission granted to share information with : Yes, Verbal Permission Granted  Share Information with NAME: [name], [name] pt daughter [phone], and Spouse [name]           Emotional Assessment Appearance:: Appears stated age Attitude/Demeanor/Rapport:  Engaged Affect (typically observed): Irritable, Pleasant Orientation: : Oriented to Situation, Oriented to  Time, Oriented to Place, Oriented to Self Alcohol / Substance Use: Not Applicable Psych Involvement: No (comment)  Admission diagnosis:  Acute respiratory failure (HCC) [J96.00] Delirium [R41.0] AKI (acute kidney injury) [N17.9] Patient Active Problem List   Diagnosis Date Noted   Acute respiratory failure (HCC) 08/25/2024   Sepsis due to methicillin resistant Staphylococcus aureus (MRSA) with acute renal failure and septic shock (HCC) 08/17/2024   Acute bacterial endocarditis 08/17/2024   Pressure injury of skin 08/17/2024   Obesity, class 3 (HCC) 07/18/2024   Septic shock (HCC) 07/12/2024   Acute on chronic diastolic CHF (congestive heart failure) (HCC) 07/09/2024   Cellulitis of right lower extremity 07/09/2024   Elevated CK 07/09/2024   AKI (acute kidney injury) 07/09/2024   History of DVT (deep vein thrombosis) 03/04/2022   Unilateral primary osteoarthritis, right hip 02/26/2020   Chronic pain syndrome 09/13/2019   Macrocytic anemia 06/12/2019   Screening for viral disease 06/12/2019   S/P cervical spinal fusion 05/22/2019   Cervical facet joint syndrome 05/22/2019   Chronic radicular lumbar pain 05/22/2019   Lumbar degenerative disc disease 05/22/2019   Lumbar spondylosis 05/22/2019   Lumbar facet arthropathy 05/22/2019   Insomnia 05/16/2019   Bilateral lower extremity edema 04/04/2019   BMI 37.0-37.9, adult 07/20/2018   Encounter for Medicare annual wellness exam 04/27/2018   Colon cancer screening 04/27/2018   Flu vaccine need 04/27/2018  Healthcare maintenance 02/15/2018   Stented coronary artery    Inclusion body myositis (HCC)    Hyperlipidemia    Coronary artery disease    Spinal stenosis    Weakness    HTN (hypertension)    PCP:  Candise Aleene DEL, MD Pharmacy:   [facility] [address] [place] [address] Phone: [phone] Fax: [phone]     Social Drivers of Health (SDOH) Social History: SDOH Screenings   Food Insecurity: Unknown (08/25/2024)  Housing: Unknown (08/25/2024)  Transportation Needs: Unknown (08/25/2024)  Utilities: Patient Unable To Answer (08/25/2024)  Alcohol Screen: Low Risk (06/05/2024)  Depression (PHQ2-9): Low Risk (12/28/2023)  Financial Resource Strain: Low Risk (06/05/2024)  Physical Activity: Inactive (06/05/2024)  Social Connections: Unknown (08/25/2024)  Recent Concern: Social Connections - Socially Isolated (08/13/2024)  Stress: No Stress Concern Present (06/05/2024)  Tobacco Use: Medium Risk (08/25/2024)  Health Literacy: Adequate Health Literacy (12/28/2023)   SDOH Interventions:     Readmission Risk Interventions    08/22/2024   12:31 PM 08/16/2024   11:30 AM  Readmission Risk Prevention Plan  Transportation Screening  Complete  PCP or Specialist Appt within 5-7 Days  Complete  Home Care Screening  Complete  Medication Review (RN CM)  Complete  HRI or Home Care Consult Complete   Social Work Consult for Recovery Care Planning/Counseling Complete   Palliative Care Screening Not Applicable   Medication Review Oceanographer) Complete

## 2024-08-26 NOTE — Plan of Care (Signed)

## 2024-08-26 NOTE — Progress Notes (Signed)
 RT NOTE: Patient resting comfortably on salter high flow nasal cannula.  No respiratory distress noted.  Bipap currently on standby and not indicated at this time.  Will continue to monitor and assess for bipap needs.

## 2024-08-27 ENCOUNTER — Inpatient Hospital Stay: Payer: Self-pay | Admitting: Internal Medicine

## 2024-08-27 DIAGNOSIS — J9611 Chronic respiratory failure with hypoxia: Secondary | ICD-10-CM

## 2024-08-27 LAB — CBC
HCT: 26.3 % — ABNORMAL LOW (ref 39.0–52.0)
Hemoglobin: 8.4 g/dL — ABNORMAL LOW (ref 13.0–17.0)
MCH: 34.9 pg — ABNORMAL HIGH (ref 26.0–34.0)
MCHC: 31.9 g/dL (ref 30.0–36.0)
MCV: 109.1 fL — ABNORMAL HIGH (ref 80.0–100.0)
Platelets: 122 10*3/uL — ABNORMAL LOW (ref 150–400)
RBC: 2.41 MIL/uL — ABNORMAL LOW (ref 4.22–5.81)
RDW: 13.9 % (ref 11.5–15.5)
WBC: 6.2 10*3/uL (ref 4.0–10.5)
nRBC: 0 % (ref 0.0–0.2)

## 2024-08-27 LAB — BASIC METABOLIC PANEL WITH GFR
Anion gap: 9 (ref 5–15)
BUN: 25 mg/dL — ABNORMAL HIGH (ref 8–23)
CO2: 33 mmol/L — ABNORMAL HIGH (ref 22–32)
Calcium: 8.5 mg/dL — ABNORMAL LOW (ref 8.9–10.3)
Chloride: 99 mmol/L (ref 98–111)
Creatinine, Ser: 1.06 mg/dL (ref 0.61–1.24)
GFR, Estimated: >60 mL/min
Glucose, Bld: 89 mg/dL (ref 70–99)
Potassium: 3.8 mmol/L (ref 3.5–5.1)
Sodium: 140 mmol/L (ref 135–145)

## 2024-08-27 LAB — GLUCOSE, CAPILLARY
Glucose-Capillary: 88 mg/dL (ref 70–99)
Glucose-Capillary: 91 mg/dL (ref 70–99)
Glucose-Capillary: 103 mg/dL — ABNORMAL HIGH (ref 70–99)
Glucose-Capillary: 145 mg/dL — ABNORMAL HIGH (ref 70–99)
Glucose-Capillary: 121 mg/dL — ABNORMAL HIGH (ref 70–99)

## 2024-08-27 LAB — MAGNESIUM: Magnesium: 2 mg/dL (ref 1.7–2.4)

## 2024-08-27 LAB — PHOSPHORUS: Phosphorus: 2.2 mg/dL — ABNORMAL LOW (ref 2.5–4.6)

## 2024-08-27 MED ORDER — IPRATROPIUM-ALBUTEROL 0.5-2.5 (3) MG/3ML IN SOLN
3.0000 mL | Freq: Four times a day (QID) | RESPIRATORY_TRACT | Status: DC
  Administered 2024-08-27 – 2024-08-29 (×7): 3 mL via RESPIRATORY_TRACT
  Filled 2024-08-27 (×7): qty 3

## 2024-08-27 MED ORDER — ALPRAZOLAM 0.25 MG PO TABS
0.2500 mg | ORAL_TABLET | Freq: Two times a day (BID) | ORAL | Status: AC | PRN
  Administered 2024-08-29: 0.25 mg via ORAL
  Filled 2024-08-27: qty 1

## 2024-08-27 MED ORDER — ENSURE PLUS HIGH PROTEIN PO LIQD
237.0000 mL | Freq: Two times a day (BID) | ORAL | Status: AC
  Administered 2024-08-27 – 2024-08-31 (×9): 237 mL via ORAL

## 2024-08-27 MED ORDER — MUSCLE RUB 10-15 % EX CREA
TOPICAL_CREAM | CUTANEOUS | Status: AC | PRN
  Administered 2024-08-30: 1 via TOPICAL
  Filled 2024-08-27: qty 85

## 2024-08-27 MED ORDER — POTASSIUM PHOSPHATES 15 MMOLE/5ML IV SOLN
15.0000 mmol | Freq: Once | INTRAVENOUS | Status: AC
  Administered 2024-08-27: 15 mmol via INTRAVENOUS
  Filled 2024-08-27: qty 5

## 2024-08-27 MED ORDER — ACETAMINOPHEN 325 MG PO TABS
650.0000 mg | ORAL_TABLET | Freq: Four times a day (QID) | ORAL | Status: AC | PRN
  Administered 2024-08-27: 650 mg via ORAL
  Filled 2024-08-27 (×2): qty 2

## 2024-08-27 MED ORDER — BUDESONIDE 0.5 MG/2ML IN SUSP
0.5000 mg | Freq: Two times a day (BID) | RESPIRATORY_TRACT | Status: AC
  Administered 2024-08-27 – 2024-08-31 (×10): 0.5 mg via RESPIRATORY_TRACT
  Filled 2024-08-27 (×10): qty 2

## 2024-08-27 NOTE — Evaluation (Signed)
 Occupational Therapy Evaluation Patient Details Name: [name] MRN: [id number] DOB: 18-Nov-1950 Today's Date: 08/27/2024   History of Present Illness   Pt is a 74 y/o male presenting with dyspnea and hypotension in setting of hypoxic/hypercarbic respiratory failure. Pt with MSSA bacteremia from sacral wounds. Also developed AKI. Recent admission 1/19-1/25/26 for acute on chronic hypoxia, hypercapnia and sepsis. PMH: CAD, HTN, HLD, inclusion body myositis, DVT, chonic LB edema, paralyzed Rt hemidiaphragm, morbid obesity     Clinical Impressions PTA, pt lives with spouse in basement apartment of their daughter's home. Prior to recent admission, pt reports Modified Independence with ADLs and transfers to/from wheelchair. Pt presents now with deficits in cardiopulmonary endurance, strength, dynamic standing balance and baseline deficits in B hand use (R>L). Today, pt requires no more than Min A x 2 for safety in completing bed mobility and transfers using RW. Based on functional skills assessments, pt requires Min A for UB ADL and Mod-Max A for LB ADLs. Pt reports family have been providing assist since recent DC home and can continue to do so. Recommend HHOT follow up upon DC.  SpO2 WFL on 2-3 L O2     If plan is discharge home, recommend the following:   A little help with walking and/or transfers;A little help with bathing/dressing/bathroom;Assistance with cooking/housework     Functional Status Assessment   Patient has had a recent decline in their functional status and demonstrates the ability to make significant improvements in function in a reasonable and predictable amount of time.     Equipment Recommendations   None recommended by OT     Recommendations for Other Services         Precautions/Restrictions   Precautions Precautions: Fall Recall of Precautions/Restrictions: Intact Precaution/Restrictions Comments: monitor sats/HR Restrictions Weight Bearing  Restrictions Per Provider Order: No     Mobility Bed Mobility Overal bed mobility: Needs Assistance Bed Mobility: Supine to Sit, Sit to Supine     Supine to sit: Min assist, +2 for safety/equipment Sit to supine: Min assist, +2 for safety/equipment   General bed mobility comments: good effort for bringing LE to EOB. assist to lift trunk and assist for BLE back to bed    Transfers Overall transfer level: Needs assistance Equipment used: Rolling walker (2 wheels) Transfers: Sit to/from Stand Sit to Stand: Min assist, +2 safety/equipment, From elevated surface           General transfer comment: Min A x 2 for safety with lines to stand with RW. assist to steady and cues for posture      Balance Overall balance assessment: Needs assistance, History of Falls Sitting-balance support: Feet supported, No upper extremity supported Sitting balance-Leahy Scale: Fair     Standing balance support: During functional activity, Reliant on assistive device for balance, Bilateral upper extremity supported Standing balance-Leahy Scale: Poor                             ADL either performed or assessed with clinical judgement   ADL Overall ADL's : Needs assistance/impaired Eating/Feeding: Set up;Sitting   Grooming: Set up;Sitting   Upper Body Bathing: Minimal assistance;Sitting   Lower Body Bathing: Moderate assistance;Sit to/from stand   Upper Body Dressing : Set up;Sitting   Lower Body Dressing: Moderate assistance;Sit to/from stand       Toileting- Architect and Hygiene: Maximal assistance;Sit to/from stand  Vision Baseline Vision/History: 1 Wears glasses Ability to See in Adequate Light: 0 Adequate Patient Visual Report: No change from baseline       Perception         Praxis         Pertinent Vitals/Pain Pain Assessment Pain Assessment: Faces Faces Pain Scale: Hurts a little bit Pain Location: neck Pain Descriptors  / Indicators: Discomfort Pain Intervention(s): Monitored during session, Limited activity within patient's tolerance     Extremity/Trunk Assessment Upper Extremity Assessment Upper Extremity Assessment: Right hand dominant;RUE deficits/detail;LUE deficits/detail RUE Deficits / Details: first 2 digits able to grasp but limited use of other digits LUE Deficits / Details: pt reports progressive deficits in this UE but not as progressed as RUE d/t myositis   Lower Extremity Assessment Lower Extremity Assessment: Defer to PT evaluation   Cervical / Trunk Assessment Cervical / Trunk Assessment: Other exceptions Cervical / Trunk Exceptions: noted head forward posture,   Communication Communication Communication: Impaired Factors Affecting Communication: Hearing impaired   Cognition Arousal: Alert Behavior During Therapy: WFL for tasks assessed/performed Cognition: No apparent impairments             OT - Cognition Comments: anxious with mobility/breathing difficulties. cues to redirect                 Following commands: Intact       Cueing  General Comments   Cueing Techniques: Verbal cues;Tactile cues;Visual cues      Exercises     Shoulder Instructions      Home Living Family/patient expects to be discharged to:: Private residence Living Arrangements: Spouse/significant other;Children Available Help at Discharge: Family;Available 24 hours/day Type of Home: House Home Access: Level entry     Home Layout: One level     Bathroom Shower/Tub: Producer, Television/film/video: Standard     Home Equipment: Agricultural Consultant (2 wheels);Cane - single point;Wheelchair - power;Grab bars - tub/shower;Shower seat;Grab bars - toilet   Additional Comments: Pt/spouse live in basement with ground level entry. Spouse uses WC for mobility, reports has 3 power chairs      Prior Functioning/Environment Prior Level of Function : Needs assist             Mobility  Comments: Prior to [facility] admission, pt was mod I at w/c level, since has required assistance for transfers ADLs Comments: Prior to December admission pt was mod I at w/c level, since has required assistance for ADLs    OT Problem List: Decreased strength;Decreased range of motion;Decreased activity tolerance;Impaired balance (sitting and/or standing);Decreased cognition;Decreased safety awareness;Decreased knowledge of precautions;Cardiopulmonary status limiting activity   OT Treatment/Interventions: Self-care/ADL training;Therapeutic exercise;Energy conservation;DME and/or AE instruction;Therapeutic activities;Patient/family education;Balance training      OT Goals(Current goals can be found in the care plan section)   Acute Rehab OT Goals Patient Stated Goal: go home soon OT Goal Formulation: With patient Time For Goal Achievement: 09/10/24 Potential to Achieve Goals: Good   OT Frequency:  Min 2X/week    Co-evaluation PT/OT/SLP Co-Evaluation/Treatment: Yes Reason for Co-Treatment: For patient/therapist safety;To address functional/ADL transfers PT goals addressed during session: Mobility/safety with mobility;Balance;Strengthening/ROM;Proper use of DME OT goals addressed during session: Strengthening/ROM;Proper use of Adaptive equipment and DME      AM-PAC OT 6 Clicks Daily Activity     Outcome Measure Help from another person eating meals?: A Little Help from another person taking care of personal grooming?: A Little Help from another person toileting, which includes using toliet, bedpan,  or urinal?: A Lot Help from another person bathing (including washing, rinsing, drying)?: A Lot Help from another person to put on and taking off regular upper body clothing?: A Little Help from another person to put on and taking off regular lower body clothing?: A Lot 6 Click Score: 15   End of Session Equipment Utilized During Treatment: Gait belt;Rolling walker (2  wheels);Oxygen Nurse Communication: Mobility status  Activity Tolerance: Patient tolerated treatment well Patient left: in bed;with call bell/phone within reach;with bed alarm set  OT Visit Diagnosis: Unsteadiness on feet (R26.81);Other abnormalities of gait and mobility (R26.89);Muscle weakness (generalized) (M62.81)                Time: 8640-8572 OT Time Calculation (min): 28 min Charges:  OT General Charges $OT Visit: 1 Visit OT Evaluation $OT Eval Moderate Complexity: 1 Mod  Mliss NOVAK, OTR/L Acute Rehab Services Office: [phone]   Mliss Fish 08/27/2024, 2:36 PM

## 2024-08-27 NOTE — Telephone Encounter (Signed)
 Pls check with pt to see what bp and HR have been the last couple of days.

## 2024-08-27 NOTE — Telephone Encounter (Signed)
 Called pt but unable to leave vm because it was full

## 2024-08-27 NOTE — Plan of Care (Signed)
" °  Problem: Clinical Measurements: Goal: Will remain free from infection Outcome: Progressing   Problem: Clinical Measurements: Goal: Diagnostic test results will improve Outcome: Progressing   Problem: Nutrition: Goal: Adequate nutrition will be maintained Outcome: Progressing   Problem: Skin Integrity: Goal: Risk for impaired skin integrity will decrease Outcome: Progressing   Plan of care, monitoring, assessment, treatment, and intervention (s) ongoing, see MAR see flowsheet "

## 2024-08-28 ENCOUNTER — Telehealth: Payer: Self-pay

## 2024-08-28 ENCOUNTER — Inpatient Hospital Stay (HOSPITAL_COMMUNITY)

## 2024-08-28 DIAGNOSIS — J9601 Acute respiratory failure with hypoxia: Secondary | ICD-10-CM | POA: Diagnosis not present

## 2024-08-28 DIAGNOSIS — J9602 Acute respiratory failure with hypercapnia: Secondary | ICD-10-CM | POA: Diagnosis not present

## 2024-08-28 LAB — CBC
HCT: 26.5 % — ABNORMAL LOW (ref 39.0–52.0)
Hemoglobin: 8.5 g/dL — ABNORMAL LOW (ref 13.0–17.0)
MCH: 34.7 pg — ABNORMAL HIGH (ref 26.0–34.0)
MCHC: 32.1 g/dL (ref 30.0–36.0)
MCV: 108.2 fL — ABNORMAL HIGH (ref 80.0–100.0)
Platelets: 133 10*3/uL — ABNORMAL LOW (ref 150–400)
RBC: 2.45 MIL/uL — ABNORMAL LOW (ref 4.22–5.81)
RDW: 13.8 % (ref 11.5–15.5)
WBC: 5.8 10*3/uL (ref 4.0–10.5)
nRBC: 0 % (ref 0.0–0.2)

## 2024-08-28 LAB — BASIC METABOLIC PANEL WITH GFR
Anion gap: 6 (ref 5–15)
BUN: 31 mg/dL — ABNORMAL HIGH (ref 8–23)
CO2: 34 mmol/L — ABNORMAL HIGH (ref 22–32)
Calcium: 8.6 mg/dL — ABNORMAL LOW (ref 8.9–10.3)
Chloride: 99 mmol/L (ref 98–111)
Creatinine, Ser: 1.17 mg/dL (ref 0.61–1.24)
GFR, Estimated: >60 mL/min
Glucose, Bld: 99 mg/dL (ref 70–99)
Potassium: 3.7 mmol/L (ref 3.5–5.1)
Sodium: 139 mmol/L (ref 135–145)

## 2024-08-28 LAB — GLUCOSE, CAPILLARY
Glucose-Capillary: 101 mg/dL — ABNORMAL HIGH (ref 70–99)
Glucose-Capillary: 109 mg/dL — ABNORMAL HIGH (ref 70–99)
Glucose-Capillary: 114 mg/dL — ABNORMAL HIGH (ref 70–99)
Glucose-Capillary: 83 mg/dL (ref 70–99)
Glucose-Capillary: 86 mg/dL (ref 70–99)
Glucose-Capillary: 96 mg/dL (ref 70–99)

## 2024-08-28 LAB — PRO BRAIN NATRIURETIC PEPTIDE: Pro Brain Natriuretic Peptide: 1027 pg/mL — ABNORMAL HIGH

## 2024-08-28 LAB — PROCALCITONIN: Procalcitonin: 0.13 ng/mL

## 2024-08-28 LAB — PHOSPHORUS: Phosphorus: 3 mg/dL (ref 2.5–4.6)

## 2024-08-28 LAB — C-REACTIVE PROTEIN: CRP: 4.8 mg/dL — ABNORMAL HIGH

## 2024-08-28 LAB — MAGNESIUM: Magnesium: 2.1 mg/dL (ref 1.7–2.4)

## 2024-08-28 MED ORDER — ONDANSETRON HCL 4 MG/2ML IJ SOLN
4.0000 mg | Freq: Four times a day (QID) | INTRAMUSCULAR | Status: AC | PRN
  Administered 2024-08-28 – 2024-08-29 (×2): 4 mg via INTRAVENOUS
  Filled 2024-08-28 (×2): qty 2

## 2024-08-28 MED ORDER — SODIUM CHLORIDE 0.9% FLUSH: 10.0000 mL | INTRAVENOUS | Status: AC | PRN

## 2024-08-28 MED ORDER — ALTEPLASE 2 MG IJ SOLR
2.0000 mg | Freq: Once | INTRAMUSCULAR | Status: AC
  Administered 2024-08-28: 2 mg
  Filled 2024-08-28: qty 2

## 2024-08-28 MED ORDER — APIXABAN 2.5 MG PO TABS
2.5000 mg | ORAL_TABLET | Freq: Two times a day (BID) | ORAL | Status: AC
  Administered 2024-08-28 – 2024-08-31 (×7): 2.5 mg via ORAL
  Filled 2024-08-28 (×7): qty 1

## 2024-08-28 NOTE — Progress Notes (Signed)
 RT at bedside for evening breathing tx. RT found pt on room air with BiPAP removed SpO2 reading 88%. RT placed pt on 2L  with improvement to SpO2.

## 2024-08-28 NOTE — Discharge Instructions (Signed)
Information on my medicine - ELIQUIS (apixaban)  This medication education was reviewed with me or my healthcare representative as part of my discharge preparation.    Why was Eliquis prescribed for you? Eliquis was prescribed to treat blood clots that may have been found in the veins of your legs (deep vein thrombosis) or in your lungs (pulmonary embolism) and to reduce the risk of them occurring again.  What do You need to know about Eliquis ? Continue Eliquis 2.5 mg tablet taken TWICE daily.  Eliquis may be taken with or without food.   Try to take the dose about the same time in the morning and in the evening. If you have difficulty swallowing the tablet whole please discuss with your pharmacist how to take the medication safely.  Take Eliquis exactly as prescribed and DO NOT stop taking Eliquis without talking to the doctor who prescribed the medication.  Stopping may increase your risk of developing a new blood clot.  Refill your prescription before you run out.  After discharge, you should have regular check-up appointments with your healthcare provider that is prescribing your Eliquis.    What do you do if you miss a dose? If a dose of ELIQUIS is not taken at the scheduled time, take it as soon as possible on the same day and twice-daily administration should be resumed. The dose should not be doubled to make up for a missed dose.  Important Safety Information A possible side effect of Eliquis is bleeding. You should call your healthcare provider right away if you experience any of the following: Bleeding from an injury or your nose that does not stop. Unusual colored urine (red or dark brown) or unusual colored stools (red or black). Unusual bruising for unknown reasons. A serious fall or if you hit your head (even if there is no bleeding).  Some medicines may interact with Eliquis and might increase your risk of bleeding or clotting while on Eliquis. To help avoid  this, consult your healthcare provider or pharmacist prior to using any new prescription or non-prescription medications, including herbals, vitamins, non-steroidal anti-inflammatory drugs (NSAIDs) and supplements.  This website has more information on Eliquis (apixaban): http://www.eliquis.com/eliquis/home

## 2024-08-28 NOTE — Progress Notes (Signed)
 RT at bedside to give afternoon breathing tx. RN of pt stated that pt was compliant w/wearing BiPAP while napping as well. RT went to grab machine and set up in room. RT woke pt to place pt on BiPAP. Pt stated he needs to drink his Ensure first. RT directed pt to notify RN when ready for BiPAP. RT notified RN.

## 2024-08-28 NOTE — Telephone Encounter (Signed)
 Home health orders received 08/28/24 for [facility] health initiation orders: Yes.  Home health re-certification orders: No. Patient last seen by ordering physician for this condition: 06/06/24. Must be less than 90 days for re-certification and less than 30 days prior for initiation. Visit must have been for the condition the orders are being placed.  Patient meets criteria for Physician to sign orders: Yes.        Current med list has been attached: No        Orders placed on physicians desk for signature: 08/28/24 (date) If patient does not meet criteria for orders to be signed: pt was called to schedule appt. Appt is scheduled for 12/04/24.    Placed on PCP desk to review and sign, if appropriate.   [name]

## 2024-08-29 DIAGNOSIS — J9602 Acute respiratory failure with hypercapnia: Secondary | ICD-10-CM | POA: Diagnosis not present

## 2024-08-29 DIAGNOSIS — J9601 Acute respiratory failure with hypoxia: Secondary | ICD-10-CM | POA: Diagnosis not present

## 2024-08-29 LAB — BASIC METABOLIC PANEL WITH GFR
Anion gap: 4 — ABNORMAL LOW (ref 5–15)
BUN: 31 mg/dL — ABNORMAL HIGH (ref 8–23)
CO2: 37 mmol/L — ABNORMAL HIGH (ref 22–32)
Calcium: 8.7 mg/dL — ABNORMAL LOW (ref 8.9–10.3)
Chloride: 100 mmol/L (ref 98–111)
Creatinine, Ser: 0.93 mg/dL (ref 0.61–1.24)
GFR, Estimated: >60 mL/min
Glucose, Bld: 84 mg/dL (ref 70–99)
Potassium: 3.9 mmol/L (ref 3.5–5.1)
Sodium: 140 mmol/L (ref 135–145)

## 2024-08-29 LAB — CBC
HCT: 24.7 % — ABNORMAL LOW (ref 39.0–52.0)
Hemoglobin: 7.8 g/dL — ABNORMAL LOW (ref 13.0–17.0)
MCH: 35.1 pg — ABNORMAL HIGH (ref 26.0–34.0)
MCHC: 31.6 g/dL (ref 30.0–36.0)
MCV: 111.3 fL — ABNORMAL HIGH (ref 80.0–100.0)
Platelets: 122 10*3/uL — ABNORMAL LOW (ref 150–400)
RBC: 2.22 MIL/uL — ABNORMAL LOW (ref 4.22–5.81)
RDW: 14.1 % (ref 11.5–15.5)
WBC: 4.4 10*3/uL (ref 4.0–10.5)
nRBC: 0 % (ref 0.0–0.2)

## 2024-08-29 LAB — RETICULOCYTES
Immature Retic Fract: 21.3 % — ABNORMAL HIGH (ref 2.3–15.9)
RBC.: 2.26 MIL/uL — ABNORMAL LOW (ref 4.22–5.81)
Retic Count, Absolute: 69.8 10*3/uL (ref 19.0–186.0)
Retic Ct Pct: 3.1 % (ref 0.4–3.1)

## 2024-08-29 LAB — IRON AND TIBC
Iron: 44 ug/dL — ABNORMAL LOW (ref 45–182)
Saturation Ratios: 23 % (ref 17.9–39.5)
TIBC: 193 ug/dL — ABNORMAL LOW (ref 250–450)
UIBC: 149 ug/dL

## 2024-08-29 LAB — VITAMIN B12: Vitamin B-12: 1333 pg/mL — ABNORMAL HIGH (ref 180–914)

## 2024-08-29 LAB — GLUCOSE, CAPILLARY
Glucose-Capillary: 104 mg/dL — ABNORMAL HIGH (ref 70–99)
Glucose-Capillary: 108 mg/dL — ABNORMAL HIGH (ref 70–99)
Glucose-Capillary: 120 mg/dL — ABNORMAL HIGH (ref 70–99)
Glucose-Capillary: 75 mg/dL (ref 70–99)
Glucose-Capillary: 90 mg/dL (ref 70–99)
Glucose-Capillary: 90 mg/dL (ref 70–99)

## 2024-08-29 LAB — CORTISOL: Cortisol, Plasma: 13.1 ug/dL

## 2024-08-29 LAB — FOLATE: Folate: 17.6 ng/mL

## 2024-08-29 LAB — T4, FREE: Free T4: 1.33 ng/dL (ref 0.80–2.00)

## 2024-08-29 LAB — TSH: TSH: 1.15 u[IU]/mL (ref 0.350–4.500)

## 2024-08-29 LAB — PHOSPHORUS: Phosphorus: 2.9 mg/dL (ref 2.5–4.6)

## 2024-08-29 LAB — MAGNESIUM: Magnesium: 1.9 mg/dL (ref 1.7–2.4)

## 2024-08-29 LAB — FERRITIN: Ferritin: 388 ng/mL — ABNORMAL HIGH (ref 24–336)

## 2024-08-29 LAB — PREPARE RBC (CROSSMATCH)

## 2024-08-29 MED ORDER — MIDODRINE HCL 5 MG PO TABS: 10.0000 mg | ORAL_TABLET | Freq: Three times a day (TID) | ORAL | Status: DC

## 2024-08-29 MED ORDER — FERROUS SULFATE 325 (65 FE) MG PO TABS
325.0000 mg | ORAL_TABLET | Freq: Two times a day (BID) | ORAL | Status: AC
  Administered 2024-08-29 – 2024-08-31 (×5): 325 mg via ORAL
  Filled 2024-08-29 (×6): qty 1

## 2024-08-29 MED ORDER — SODIUM CHLORIDE 0.9% IV SOLUTION: Freq: Once | INTRAVENOUS | Status: AC

## 2024-08-29 MED ORDER — MIDODRINE HCL 5 MG PO TABS
5.0000 mg | ORAL_TABLET | Freq: Three times a day (TID) | ORAL | Status: AC
  Administered 2024-08-29 – 2024-08-31 (×6): 5 mg via ORAL
  Filled 2024-08-29 (×5): qty 1

## 2024-08-29 MED ORDER — TORSEMIDE 20 MG PO TABS: 20.0000 mg | ORAL_TABLET | Freq: Every day | ORAL | Status: DC

## 2024-08-29 MED ORDER — PANTOPRAZOLE SODIUM 40 MG PO TBEC
40.0000 mg | DELAYED_RELEASE_TABLET | Freq: Every day | ORAL | Status: AC
  Administered 2024-08-29 – 2024-08-31 (×2): 40 mg via ORAL
  Filled 2024-08-29 (×2): qty 1

## 2024-08-29 NOTE — Telephone Encounter (Signed)
Signed and put in box to go up front.  

## 2024-08-29 NOTE — Progress Notes (Addendum)
 "                                                                                                                                                                                                                                                                                PROGRESS NOTE     Patient Demographics:    [name], is a 74 y.o. 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 Now comes again with similar presentation of dyspnea and hypotension. The patient is a poor historian and is bordering on respiratory extremis upon our arrival. He was immediately placed on NIV with improvement in his WOB, but with hypotension. He was intolerant to diuretics with worsening of his renal function. He is presently on Ancef  to complete 2/3 via a PICC placed 1/25.  Source of infection was felt to likely be from his chronic sacral wounds.  TEE was performed without vegetations.    He was under the care of PCCM, stabilized and transferred to my care on 08/28/2024 on day 3 of hospital stay.   Significant Hospital events:  1/19: Admit [facility], received 3.5 L IVF, 12.5 g albumin  x 2, started on midodrine  1/20: Transferred to ICU, PCCM consulted, discontinue midodrine  due to reflex bradycardia, started on norepinephrine , started on cefazolin  for positive blood culture with MSSA, staph epi.  Infectious disease consulted 1/21: Norepinephrine  titrated  off 1/22: Remains off of vasopressors, transfer to telemetry, PCCM signed off 1/23- TEE 1/25-volume overload-dosing Lasix  daily 1/25-PICC line placed in right upper extremity , the PICC line should  be removed at the end of therapy  1/31 readmitted for dyspnea and hypotension , AKI,?  His home NIV was not working     Subjective:   Patient in bed, appears comfortable, denies any headache, no fever, no chest pain or pressure, no shortness of breath , no abdominal pain. No focal weakness.   Assessment  & Plan :   Acute on chronic hypoxic and hypercarbic respiratory failure - Related to diaphragm weakness and inclusion body myositis Underlying OSA, on home NIV, Likely acute cor pulmonale   -On HFNC which has decreased from 8> 2 L in a day.  Not on daytime home oxygen, does have nighttime BiPAP at home which was prescribed recently, was missed apparently due to some miscommunication at SNF. - With continued BiPAP at night and daytime 2 L nasal cannula oxygen he is clinically much better and close to his baseline, maintaining his airway, counseled extensively to use nighttime BiPAP, staff also informed in detail.  Clinically close to baseline.  May require daytime oxygen upon discharge     Hypotension , doubt this was related to sepsis, now improved.  Recent admission for MSSA bacteremia from sacral wounds Recent TEE 1/23 has shown normal LV function, no shunt, dilated RA RV suggestive of cor pulmonale - Did not require pressors -SBP goal > 90; -Gentle diuresis as blood pressure permits -Continue Ancef  as outlined earlier until 2/3 via PICC line, discontinue PICC line prior to discharge.   H/O RV dysfunction and Cor pulmonale - Cards input.  Hypotension.  Improved.  On midodrine , likely due to combination of ongoing diuresis, underlying RV dysfunction and cor pulmonale, recent bacteremia, anemia of chronic disease causing hypovolemia.  Plan is to add TED stockings, continue midodrine  at 5  mg 3 times daily as previously he has had issues with bradycardia on higher doses, check cortisol and TSH.  Infuse 1 unit of packed RBC and monitor blood pressure.  Afebrile, no leukocytosis, stable procalcitonin.   Anemia of chronic disease.  No signs of ongoing bleeding.  PPI, anemia panel, transfuse 1 unit of packed RBC on 08/29/2024 as he is hypotensive.   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  History of the same is seen neurologist in [place] 2 years ago but no recent follow-up, postdischarge PCP to arrange for the same.  Continue PT OT, largely bedbound.  Has right arm PICC line finishing antibiotics on 08/28/2024.  Subsequently will be removed prior to discharge.       Condition - Extremely Guarded  Family Communication  :    Called daughter [phone]  and son-in-law [phone]  both on 08/28/2024 at 9:20 AM and message left on the cell phone's.  [place] wife [phone]  - updated 08/28/24  Daughter updated bedside 08/29/24  Code Status :  Full  Consults  :  PCCM, Renal, Cards  PUD Prophylaxis :     Procedures  :     TEE - 1. Left ventricular ejection fraction, by estimation, is 60 to 65%. The left ventricle has normal function. The left ventricle has no regional wall motion abnormalities.  2. Right ventricular systolic function is normal. The right ventricular size is mildly enlarged. Tricuspid regurgitation signal is inadequate for assessing PA pressure.  3. Left atrial size was mildly dilated. No left atrial/left atrial appendage thrombus was detected.  4. Right atrial size was moderately dilated.  5. The mitral valve is normal in structure. No evidence of mitral valve regurgitation. No evidence of mitral stenosis.  6. Tricuspid valve regurgitation is mild to moderate.  7. The aortic valve is  tricuspid. There is mild calcification of the aortic valve. Aortic valve regurgitation is mild. Aortic valve sclerosis is present, with no evidence of aortic valve stenosis.  8. Aortic dilatation noted. There is mild dilatation of the aortic root, measuring 42 mm.  9. Agitated saline contrast bubble study was negative, with no evidence of any interatrial shunt.  TTE 1. Left ventricular ejection fraction, by estimation, is 70 to 75%. The left ventricle has hyperdynamic function. The left ventricle has no regional wall motion abnormalities. Left ventricular diastolic parameters are indeterminate.  2. Right ventricular systolic function is moderately reduced. The right ventricular size is severely enlarged. Mildly increased right ventricular wall thickness. There is severely elevated pulmonary artery systolic pressure. The estimated right ventricular systolic pressure is 66.0 mmHg.  3. Left atrial size was mildly dilated.  4. Right atrial size was moderately dilated.  5. The mitral valve is grossly normal. No evidence of mitral valve regurgitation. No evidence of mitral stenosis.  6. Tricuspid valve regurgitation is mild to moderate.  7. The aortic valve is grossly normal. Aortic valve regurgitation is not visualized.  8. Aortic dilatation noted. There is mild dilatation of the ascending aorta, measuring 41 mm.  9. The inferior vena cava is dilated in size with <50% respiratory variability, suggesting right atrial pressure of 15 mmHg.  Renal ultrasound.  Nonacute      Disposition Plan  :    Status is: Inpatient   DVT Prophylaxis  :    Place TED hose Start: 08/29/24 0919 apixaban  (ELIQUIS ) tablet 2.5 mg Start: 08/28/24 1000 apixaban  (ELIQUIS ) tablet 2.5 mg     Lab Results  Component Value Date   PLT 122 (L) 08/29/2024    Diet :  Diet Order             DIET DYS 3 Room service appropriate? Yes; Fluid consistency: Thin  Diet effective now                    Inpatient  Medications  Scheduled Meds:  apixaban   2.5 mg Oral BID   budesonide  (PULMICORT ) nebulizer solution  0.5 mg Nebulization BID   Chlorhexidine  Gluconate Cloth  6 each Topical Daily   ezetimibe   10 mg Oral Daily   feeding supplement  237 mL Oral BID BM   ferrous sulfate   325 mg Oral BID WC   folic acid   1 mg Oral Daily   Gerhardt's butt cream  1 Application Topical BID   hydrocerin   Topical Daily   ipratropium-albuterol   3 mL Nebulization QID   midodrine   5 mg Oral TID WC   pantoprazole   40 mg Oral Daily   rosuvastatin   5 mg Oral Daily   tamsulosin   0.4 mg Oral QPC supper   thiamine   100 mg Oral Daily   [START ON 08/30/2024] torsemide   20 mg Oral Daily   Continuous Infusions:   PRN Meds:.acetaminophen , ALPRAZolam , Muscle Rub, ondansetron  (ZOFRAN ) IV, mouth rinse, phenol, polyethylene glycol, senna, sodium chloride  flush    Objective:   Vitals:   08/29/24 0509 08/29/24 0754 08/29/24 0811 08/29/24 0926  BP: (!) 109/51  (!) 82/42 (!) 108/50  Pulse: 81   94  Resp: 19     Temp: (!) 97.5  F (36.4 C)  97.7 F (36.5 C)   TempSrc: Oral  Oral   SpO2:  98% (!) 2% (!) 2%  Weight:      Height:        Wt Readings from Last 3 Encounters:  08/27/24 112.1 kg  08/22/24 120 kg  07/20/24 (!) 137.5 kg     Intake/Output Summary (Last 24 hours) at 08/29/2024 1005 Last data filed at 08/28/2024 1600 Gross per 24 hour  Intake 287 ml  Output --  Net 287 ml     Physical Exam  Awake Alert, No new F.N deficits, Normal affect [place].AT,PERRAL Supple Neck, No JVD,   Symmetrical Chest wall movement, Good air movement bilaterally, CTAB RRR,No Gallops,Rubs or new Murmurs,  +ve B.Sounds, Abd Soft, No tenderness,   No Cyanosis, Clubbing or edema     RN pressure injury documentation: Wound 08/13/24 1840 Pressure Injury Coccyx Mid Stage 2 -  Partial thickness loss of dermis presenting as a shallow open injury with a red, pink wound bed without slough. (Active)     Wound 08/13/24 1840 Pressure  Injury Buttocks Right;Left;Medial Deep Tissue Pressure Injury - Purple or maroon localized area of discolored intact skin or blood-filled blister due to damage of underlying soft tissue from pressure and/or shear. (Active)     Wound 08/13/24 1841 Pressure Injury Ischial tuberosity Right;Left Deep Tissue Pressure Injury - Purple or maroon localized area of discolored intact skin or blood-filled blister due to damage of underlying soft tissue from pressure and/or shear. (Active)     Wound 08/25/24 0750 Pressure Injury Coccyx (Active)      Data Review:    Recent Labs  Lab 08/25/24 0325 08/25/24 0344 08/26/24 0455 08/27/24 0349 08/28/24 0312 08/29/24 0359  WBC 10.5  --  7.7 6.2 5.8 4.4  HGB 10.2* 10.9* 8.2* 8.4* 8.5* 7.8*  HCT 31.1* 32.0* 25.1* 26.3* 26.5* 24.7*  PLT 129*  --  109* 122* 133* 122*  MCV 106.5*  --  107.7* 109.1* 108.2* 111.3*  MCH 34.9*  --  35.2* 34.9* 34.7* 35.1*  MCHC 32.8  --  32.7 31.9 32.1 31.6  RDW 13.9  --  13.9 13.9 13.8 14.1    Recent Labs  Lab 08/25/24 0325 08/25/24 0344 08/25/24 1200 08/26/24 0455 08/27/24 0349 08/28/24 0312 08/29/24 0359  NA 139 138 140 141 140 139 140  K 4.6 4.2 4.1 3.5 3.8 3.7 3.9  CL 94*  --  99 100 99 99 100  CO2 29  --  31 33* 33* 34* 37*  ANIONGAP 15  --  11 8 9 6  4*  GLUCOSE 75  --  71 96 89 99 84  BUN 34*  --  35* 32* 25* 31* 31*  CREATININE 2.81*  --  2.44* 1.76* 1.06 1.17 0.93  CRP  --   --   --   --   --  4.8*  --   PROCALCITON  --   --   --   --   --  0.13  --   LATICACIDVEN  --  1.5  --   --   --   --   --   INR 1.9*  --   --   --   --   --   --   MG  --   --   --  1.9 2.0 2.1 1.9  PHOS  --   --   --  3.3 2.2* 3.0 2.9  CALCIUM  8.6*  --  8.0*  8.2* 8.5* 8.6* 8.7*      Recent Labs  Lab 08/25/24 0325 08/25/24 0344 08/25/24 1200 08/26/24 0455 08/27/24 0349 08/28/24 0312 08/29/24 0359  CRP  --   --   --   --   --  4.8*  --   PROCALCITON  --   --   --   --   --  0.13  --   LATICACIDVEN  --  1.5  --   --    --   --   --   INR 1.9*  --   --   --   --   --   --   MG  --   --   --  1.9 2.0 2.1 1.9  CALCIUM  8.6*  --  8.0* 8.2* 8.5* 8.6* 8.7*    --------------------------------------------------------------------------------------------------------------- Lab Results  Component Value Date   CHOL 99 12/12/2023   HDL 53.40 12/12/2023   LDLCALC 36 12/12/2023   LDLDIRECT 65 05/16/2019   TRIG 45.0 12/12/2023   CHOLHDL 2 12/12/2023    Lab Results  Component Value Date   HGBA1C 5.3 05/05/2023   No results for input(s): TSH, T4TOTAL, FREET4, T3FREE, THYROIDAB in the last 72 hours. No results for input(s): VITAMINB12, FOLATE, FERRITIN, TIBC, IRON, RETICCTPCT in the last 72 hours. ------------------------------------------------------------------------------------------------------------------ Cardiac Enzymes No results for input(s): CKMB, TROPONINI, MYOGLOBIN in the last 168 hours.  Invalid input(s): CK ProBNP    Component Value Date/Time   PROBNP 1,027.0 (H) 08/28/2024 0312   PROBNP 549 (H) 04/10/2024 1338     Micro Results Recent Results (from the past 240 hours)  Culture, blood (single)     Status: None (Preliminary result)   Collection Time: 08/25/24  5:20 AM   Specimen: BLOOD LEFT ARM  Result Value Ref Range Status   Specimen Description BLOOD LEFT ARM  Final   Special Requests   Final    BOTTLES DRAWN AEROBIC AND ANAEROBIC Blood Culture results may not be optimal due to an inadequate volume of blood received in culture bottles   Culture   Final    NO GROWTH 4 DAYS Performed at [facility], [address] [place], [place] [address]    Report Status PENDING  Incomplete  MRSA Next Gen by PCR, Nasal     Status: None   Collection Time: 08/25/24  6:07 AM   Specimen: Nasal Mucosa; Nasal Swab  Result Value Ref Range Status   MRSA by PCR Next Gen NOT DETECTED NOT DETECTED Final    Comment: (NOTE) The GeneXpert MRSA Assay (FDA approved for NASAL  specimens only), is one component of a comprehensive MRSA colonization surveillance program. It is not intended to diagnose MRSA infection nor to guide or monitor treatment for MRSA infections. Test performance is not FDA approved in patients less than 78 years old. Performed at [facility], [address] [place], [place] [address]     Radiology Report DG Chest Port 1 View Result Date: 08/28/2024 EXAM: 1 VIEW(S) XRAY OF THE CHEST 08/28/2024 06:38:00 AM COMPARISON: Portable chest 08/25/2024. CLINICAL HISTORY: SOB (shortness of breath) [id number] shortness of breath. FINDINGS: LINES, TUBES AND DEVICES: Telemetry leads overlie the chest. Right PICC again noted with the tip at the brachiocephalic/SVC junction. LUNGS AND PLEURA: Chronic elevated right hemidiaphragm with colonic interposition. Patchy airspace disease continues in the lower lung fields, not well seen on the right due to the elevated hemidiaphragm. There are small pleural effusions. No pneumothorax. HEART AND MEDIASTINUM: Stable cardiomegaly  and mild central vascular prominence. Stable mediastinum with aortic atherosclerosis. BONES AND SOFT TISSUES: Moderate thoracic spondylosis. No acute osseous abnormality. IMPRESSION: 1. Patchy airspace disease in the lower lung fields, not well seen on the right due to the elevated hemidiaphragm. Overall aeration seems unchanged. 2. Small pleural effusions. 3. Stable cardiomegaly and mild central vascular prominence. Electronically signed by: Francis Quam MD 08/28/2024 07:12 AM EST RP Workstation: [id number]   US  RENAL Result Date: 08/26/2024 EXAM: RETROPERITONEAL ULTRASOUND OF THE KIDNEYS 08/26/2024 09:58:44 PM TECHNIQUE: Real-time ultrasonography of the retroperitoneum, specifically the kidneys and urinary bladder, was performed. Evaluation of the kidneys is limited by body habitus. Patient unable to hold breath. COMPARISON: US  Renal 07/09/2024 and CT 07/10/2024. CLINICAL HISTORY: Acute kidney  injury. FINDINGS: RIGHT KIDNEY: Right kidney measures 11.0 cm in length. Normal cortical echogenicity. No hydronephrosis. No calculus. No mass. Right ureteral jet is not visualized. LEFT KIDNEY: Left kidney measures 12.4 cm in length. Normal cortical echogenicity. No hydronephrosis. No calculus. No mass. Left ureteral jet is not visualized. BLADDER: Unremarkable appearance of the bladder. IMPRESSION: 1. No acute renal abnormality. Electronically signed by: Norman Gatlin MD 08/26/2024 10:08 PM EST RP Workstation: [id number]   DG Chest Port 1 View Result Date: 08/25/2024 EXAM: 1 VIEW XRAY OF THE CHEST 08/25/2024 03:10:00 AM COMPARISON: 08/20/2024 CLINICAL HISTORY: Shortness of breath. FINDINGS: LINES, TUBES AND DEVICES: Stable right PICC line. LUNGS AND PLEURA: Low lung volumes with elevated right hemidiaphragm with superimposed linear and veiling opacity at the right lung base. Small pleural effusion. No pneumothorax or pulmonary edema. HEART AND MEDIASTINUM: No acute abnormality of the cardiac and mediastinal silhouettes. BONES AND SOFT TISSUES: No acute osseous abnormality. Bowel gas below the diaphragm. IMPRESSION: 1. Right basilar atelectasis in the setting of low lung volumes and an elevated right hemidiaphragm. 2. Small right pleural effusion. No pneumothorax or pulmonary edema. Electronically signed by: Waddell Calk MD 08/25/2024 06:44 AM EST RP Workstation: [id number]   DG CHEST PORT 1 VIEW Result Date: 08/20/2024 CLINICAL DATA:  Hypoxia. EXAM: PORTABLE CHEST 1 VIEW COMPARISON:  08/18/2024 and CT chest 07/12/2024. FINDINGS: Trachea is midline. Heart is enlarged. Thoracic aorta is calcified. Lungs are low in volume with bibasilar airspace opacification, slightly improved from 08/18/2024. Bibasilar atelectasis. Difficult to exclude small bilateral pleural effusions. Elevated right hemidiaphragm. IMPRESSION: 1. Low lung volumes with an elevated right hemidiaphragm. Associated bibasilar atelectasis,  improved from 08/18/2024. 2. Difficult to exclude small bilateral pleural effusions. Electronically Signed   By: Newell Eke M.D.   On: 08/20/2024 14:11   US  EKG SITE RITE Result Date: 08/19/2024 If Site Rite image not attached, placement could not be confirmed due to current cardiac rhythm.  DG CHEST PORT 1 VIEW Result Date: 08/18/2024 CLINICAL DATA:  Aspiration into airway. EXAM: PORTABLE CHEST 1 VIEW COMPARISON:  08/14/2024, 07/12/2024. FINDINGS: The heart is enlarged and the mediastinal contour stable. There is atherosclerotic calcification of the aorta. Lung volumes are low. Interstitial prominence is noted bilaterally with airspace disease at the lung bases. There are likely small bilateral pleural effusions. No pneumothorax is seen. Cervical spinal fusion hardware is noted. IMPRESSION: 1. Increased interstitial prominence with airspace disease at the lung bases, possible edema or infiltrate. 2. Suspected small bilateral pleural effusions. Electronically Signed   By: Leita Waddell M.D.   On: 08/18/2024 16:47   ECHO TEE Result Date: 08/17/2024    TRANSESOPHOGEAL ECHO REPORT   Patient Name:   [name] Date of Exam: 08/17/2024 Medical Rec #:  [id number]  Height:       71.0 in Accession #:    [id number]  Weight:       272.3 lb Date of Birth:  Dec 10, 1950   BSA:          2.404 m Patient Age:    74 years    BP:           140/89 mmHg Patient Gender: M           HR:           93 bpm. Exam Location:  Inpatient Procedure: Transesophageal Echo, Cardiac Doppler, Color Doppler and Saline            Contrast Bubble Study (Both Spectral and Color Flow Doppler were            utilized during procedure). Indications:    Bacteremia  History:        Patient has prior history of Echocardiogram examinations.  Sonographer:    Jayson Gaskins Referring Phys: [id number] ZANE ADAMS PROCEDURE: The transesophogeal probe was passed without difficulty through the esophogus of the patient. Sedation performed by different physician.  The patient developed no complications during the procedure.  IMPRESSIONS  1. Left ventricular ejection fraction, by estimation, is 60 to 65%. The left ventricle has normal function. The left ventricle has no regional wall motion abnormalities.  2. Right ventricular systolic function is normal. The right ventricular size is mildly enlarged. Tricuspid regurgitation signal is inadequate for assessing PA pressure.  3. Left atrial size was mildly dilated. No left atrial/left atrial appendage thrombus was detected.  4. Right atrial size was moderately dilated.  5. The mitral valve is normal in structure. No evidence of mitral valve regurgitation. No evidence of mitral stenosis.  6. Tricuspid valve regurgitation is mild to moderate.  7. The aortic valve is tricuspid. There is mild calcification of the aortic valve. Aortic valve regurgitation is mild. Aortic valve sclerosis is present, with no evidence of aortic valve stenosis.  8. Aortic dilatation noted. There is mild dilatation of the aortic root, measuring 42 mm.  9. Agitated saline contrast bubble study was negative, with no evidence of any interatrial shunt. Conclusion(s)/Recommendation(s): No evidence of vegetation/infective endocarditis on this transesophageael echocardiogram. FINDINGS  Left Ventricle: Left ventricular ejection fraction, by estimation, is 60 to 65%. The left ventricle has normal function. The left ventricle has no regional wall motion abnormalities. The left ventricular internal cavity size was normal in size. There is  no left ventricular hypertrophy. Right Ventricle: The right ventricular size is mildly enlarged. No increase in right ventricular wall thickness. Right ventricular systolic function is normal. Tricuspid regurgitation signal is inadequate for assessing PA pressure. Left Atrium: Left atrial size was mildly dilated. No left atrial/left atrial appendage thrombus was detected. Right Atrium: Right atrial size was moderately dilated.  Pericardium: There is no evidence of pericardial effusion. Mitral Valve: The mitral valve is normal in structure. No evidence of mitral valve regurgitation. No evidence of mitral valve stenosis. Tricuspid Valve: The tricuspid valve is normal in structure. Tricuspid valve regurgitation is mild to moderate. Aortic Valve: The aortic valve is tricuspid. There is mild calcification of the aortic valve. Aortic valve regurgitation is mild. Aortic valve sclerosis is present, with no evidence of aortic valve stenosis. Pulmonic Valve: The pulmonic valve was grossly normal. Pulmonic valve regurgitation is mild to moderate. No evidence of pulmonic stenosis. Aorta: Aortic dilatation noted. There is mild dilatation of the aortic root, measuring 42 mm. IAS/Shunts: There is left  bowing of the interatrial septum, suggestive of elevated right atrial pressure. There is redundancy of the interatrial septum. No atrial level shunt detected by color flow Doppler. Agitated saline contrast was given intravenously to evaluate for intracardiac shunting. Agitated saline contrast bubble study was negative, with no evidence of any interatrial shunt.   AORTA Ao Root diam: 4.23 cm Ao Asc diam:  3.24 cm Jerel Balding MD Electronically signed by Jerel Balding MD Signature Date/Time: 08/17/2024/2:49:15 PM    Final    EP STUDY Result Date: 08/17/2024 See surgical note for result.  ECHOCARDIOGRAM COMPLETE Result Date: 08/14/2024    ECHOCARDIOGRAM REPORT   Patient Name:   [name] Date of Exam: 08/14/2024 Medical Rec #:  [id number]   Height:       71.0 in Accession #:    [id number]  Weight:       272.3 lb Date of Birth:  July 15, 1951   BSA:          2.404 m Patient Age:    74 years    BP:           92/33 mmHg Patient Gender: M           HR:           70 bpm. Exam Location:  Inpatient Procedure: 2D Echo, Cardiac Doppler, Color Doppler and Intracardiac            Opacification Agent (Both Spectral and Color Flow Doppler were            utilized  during procedure). Indications:    Shock  History:        Patient has prior history of Echocardiogram examinations, most                 recent 07/10/2024. CHF, CAD, Abnormal ECG, Arrythmias:Atrial                 Fibrillation, Signs/Symptoms:Bacteremia; Risk                 Factors:Hypertension.  Sonographer:    Ellouise Mose RDCS Referring Phys: [id number] ERIC J AUSTRIA  Sonographer Comments: Technically difficult study due to poor echo windows, suboptimal parasternal window, suboptimal apical window and suboptimal subcostal window. Study was originally ordered as limited, then MD changed to complete with definity . IMPRESSIONS  1. Left ventricular ejection fraction, by estimation, is 70 to 75%. The left ventricle has hyperdynamic function. The left ventricle has no regional wall motion abnormalities. Left ventricular diastolic parameters are indeterminate.  2. Right ventricular systolic function is moderately reduced. The right ventricular size is severely enlarged. Mildly increased right ventricular wall thickness. There is severely elevated pulmonary artery systolic pressure. The estimated right ventricular systolic pressure is 66.0 mmHg.  3. Left atrial size was mildly dilated.  4. Right atrial size was moderately dilated.  5. The mitral valve is grossly normal. No evidence of mitral valve regurgitation. No evidence of mitral stenosis.  6. Tricuspid valve regurgitation is mild to moderate.  7. The aortic valve is grossly normal. Aortic valve regurgitation is not visualized.  8. Aortic dilatation noted. There is mild dilatation of the ascending aorta, measuring 41 mm.  9. The inferior vena cava is dilated in size with <50% respiratory variability, suggesting right atrial pressure of 15 mmHg. Comparison(s): Prior images reviewed side by side. Changes from prior study are noted. Right ventricular size has increased and right ventricular function has decreased. FINDINGS  Left Ventricle: Left ventricular ejection  fraction, by estimation, is  70 to 75%. The left ventricle has hyperdynamic function. The left ventricle has no regional wall motion abnormalities. Definity  contrast agent was given IV to delineate the left ventricular endocardial borders. The left ventricular internal cavity size was normal in size. There is borderline concentric left ventricular hypertrophy. Left ventricular diastolic function could not be evaluated due to atrial fibrillation. Left ventricular diastolic parameters are indeterminate. Right Ventricle: The right ventricular size is severely enlarged. Mildly increased right ventricular wall thickness. Right ventricular systolic function is moderately reduced. There is severely elevated pulmonary artery systolic pressure. The tricuspid regurgitant velocity is 3.57 m/s, and with an assumed right atrial pressure of 15 mmHg, the estimated right ventricular systolic pressure is 66.0 mmHg. Left Atrium: Left atrial size was mildly dilated. Right Atrium: Right atrial size was moderately dilated. Pericardium: There is no evidence of pericardial effusion. Mitral Valve: The mitral valve is grossly normal. Mild mitral annular calcification. No evidence of mitral valve regurgitation. No evidence of mitral valve stenosis. Tricuspid Valve: The tricuspid valve is grossly normal. Tricuspid valve regurgitation is mild to moderate. No evidence of tricuspid stenosis. Aortic Valve: The aortic valve is grossly normal. Aortic valve regurgitation is not visualized. Pulmonic Valve: The pulmonic valve was grossly normal. Pulmonic valve regurgitation is mild. Aorta: Aortic dilatation noted. There is mild dilatation of the ascending aorta, measuring 41 mm. Venous: The inferior vena cava is dilated in size with less than 50% respiratory variability, suggesting right atrial pressure of 15 mmHg. IAS/Shunts: The interatrial septum was not well visualized.  LEFT VENTRICLE PLAX 2D LVIDd:         4.00 cm LVIDs:         2.70 cm LV PW:          1.00 cm LV IVS:        1.20 cm  LV Volumes (MOD) LV vol d, MOD A2C: 109.7 ml LV vol d, MOD A4C: 112.0 ml LV vol s, MOD A2C: 25.5 ml LV vol s, MOD A4C: 41.3 ml LV SV MOD A2C:     84.3 ml LV SV MOD A4C:     112.0 ml LV SV MOD BP:      78.9 ml IVC IVC diam: 2.45 cm RIGHT ATRIUM           Index RA Area:     25.10 cm RA Volume:   80.90 ml  33.65 ml/m  AORTIC VALVE LVOT Vmax:   89.70 cm/s LVOT Vmean:  56.000 cm/s LVOT VTI:    0.194 m  AORTA Ao Asc diam: 4.03 cm MITRAL VALVE                TRICUSPID VALVE MV Area (PHT): 3.65 cm     TR Peak grad:   51.0 mmHg MV Decel Time: 208 msec     TR Vmax:        357.00 cm/s MV E velocity: 102.00 cm/s                             SHUNTS                             Systemic VTI: 0.19 m Jerel Croitoru MD Electronically signed by Jerel Balding MD Signature Date/Time: 08/14/2024/7:26:01 PM    Final    VAS US  ABI WITH/WO TBI Result Date: 08/14/2024  LOWER EXTREMITY DOPPLER STUDY Patient Name:  [name]  Date  of Exam:   08/14/2024 Medical Rec #: [id number]    Accession #:    [id number] Date of Birth: 18-Jan-1951    Patient Gender: M Patient Age:   58 years Exam Location:  [facility] Procedure:      VAS US  ABI WITH/WO TBI Referring Phys: ERIC AUSTRIA --------------------------------------------------------------------------------  Indications: Peripheral artery disease. High Risk Factors: Hypertension, hyperlipidemia, past history of smoking,                    coronary artery disease.  Vascular Interventions: CHF, Lymphedema, Hx of DVT and venous stasis disease. Limitations: Today's exam was limited due to continuous patient movement,              tough/thickened skin. Comparison Study: Previous exam 03/04/2022 Performing Technologist: Leigh Rom RVT, RDMS  Examination Guidelines: A complete evaluation includes at minimum, Doppler waveform signals and systolic blood pressure reading at the level of bilateral brachial, anterior tibial, and posterior tibial arteries, when  vessel segments are accessible. Bilateral testing is considered an integral part of a complete examination. Photoelectric Plethysmograph (PPG) waveforms and toe systolic pressure readings are included as required and additional duplex testing as needed. Limited examinations for reoccurring indications may be performed as noted.  ABI Findings: +---------+------------------+-----+----------+----------------+ Right    Rt Pressure (mmHg)IndexWaveform  Comment          +---------+------------------+-----+----------+----------------+ Brachial 105                    triphasic                  +---------+------------------+-----+----------+----------------+ PTA                             monophasicnon compressible +---------+------------------+-----+----------+----------------+ DP                              monophasicnon compressible +---------+------------------+-----+----------+----------------+ Great Toe26                0.23 Abnormal                   +---------+------------------+-----+----------+----------------+ +---------+------------------+-----+----------+----------------+ Left     Lt Pressure (mmHg)IndexWaveform  Comment          +---------+------------------+-----+----------+----------------+ Brachial 112                    triphasic                  +---------+------------------+-----+----------+----------------+ PTA      127               1.13 monophasic                 +---------+------------------+-----+----------+----------------+ DP                              monophasicnon compressible +---------+------------------+-----+----------+----------------+ Great Toe20                0.18 Abnormal                   +---------+------------------+-----+----------+----------------+ +-------+-----------+-----------+------------+------------+ ABI/TBIToday's ABIToday's TBIPrevious ABIPrevious TBI  +-------+-----------+-----------+------------+------------+ Right  [place]         0.23                 0.75         +-------+-----------+-----------+------------+------------+ Left  [place]         0.18       [place]          0.57         +-------+-----------+-----------+------------+------------+ Arterial wall calcification precludes accurate ankle pressures and ABIs. Bilateral ABIs appear essentially unchanged. Bilateral TBIs appear decreased.  Summary: Right: Resting right ankle-brachial index indicates noncompressible right lower extremity arteries. The right toe-brachial index is abnormal.  Left: Resting left ankle-brachial index indicates noncompressible left lower extremity arteries. The left toe-brachial index is abnormal.  *See table(s) above for measurements and observations.  Electronically signed by Gaile New MD on 08/14/2024 at 5:42:38 PM.    Final    DG CHEST PORT 1 VIEW Result Date: 08/14/2024 CLINICAL DATA:  Shortness of breath. EXAM: PORTABLE CHEST 1 VIEW COMPARISON:  August 13, 2024 FINDINGS: Stable cardiomegaly. Elevated right hemidiaphragm is noted. Minimal bibasilar subsegmental atelectasis is noted with small pleural effusions. IMPRESSION: Minimal bibasilar subsegmental atelectasis with small pleural effusions. Electronically Signed   By: Lynwood Landy Raddle M.D.   On: 08/14/2024 10:48   DG Chest 1 View Result Date: 08/13/2024 CLINICAL DATA:  Shortness of breath. EXAM: CHEST  1 VIEW COMPARISON:  07/17/2024. FINDINGS: Persistent low lung volumes with chronic elevation of the right hemidiaphragm and increased right basilar atelectasis. Left lung appears clear. No pneumothorax. Stable cardiomegaly. No acute osseous abnormality. IMPRESSION: Persistent low lung volumes with chronic elevation of the right hemidiaphragm and increased right basilar atelectasis. Electronically Signed   By: Harrietta Sherry M.D.   On: 08/13/2024 13:54     Signature  -   Lavada Stank M.D on 08/29/2024 at 10:05  AM   -  To page go to [email or web address]   "

## 2024-08-29 NOTE — Telephone Encounter (Signed)
 Forms faxed back.

## 2024-08-29 NOTE — Plan of Care (Signed)

## 2024-08-29 NOTE — Consult Note (Addendum)
 "  Cardiology Consultation   Patient ID: [name] MRN: [id number]; DOB: [birth year]  Admit date: 08/25/2024 Date of Consult: 08/29/2024  PCP:  Candise Aleene DEL, MD   [facility] Providers Cardiologist:  Vina Gull, MD        Patient Profile: [name] is a 74 y.o. 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 Dennise.  History of Present Illness: Mr. Thrush follows with Dr. Gull with history of the above. His last cath was in 2015 with patent BMS to prox LAD, 20-39% prox RCA, 20-30% eccentric narrowing followed by 99% prox acute marginal -> s/p DES to RCA. He had prior LLE DVT dx 2022, started on anticoagulation, as well as duplex in 2023 by vascular showing what they felt was chronic appearing thrombus in the femoral/popliteal vein (OV note reports this was R rather than L as per report). Repeat RLE venous duplex 06/2024 neg for DVT. Prior monitor 2024 showed NSR 69bpm, range 44-102bpm, rare PACs, 6% PVCs. He historically has struggled with LE edema requiring referral to the lymphedema clinic. Notes indicate this has persisted even with diuresis. Echo 2024 showed normal LV, mildly reduced RVSF.  This is his 3rd admission in the last 2 months. 12/15-12/26/25 was admitted for RLE cellulitis, a/c CHF, AKI, impending rhabdomyolysis, septic shock, hypothermic with lethargy, abnormal CT chest with elevated R hemidiaphragm, complete RML/near complete RLL atelectasis, 4.1cm thoracic  aortic dilation. He was readmitted 1/19-1/28/26 with fall, found to have septic shock requiring pressors, MSSA bacteremia felt secondary to sacral wounds, staphylococcus epidermidis bacteremia, AKI, anasarca, elevated LFTs, delirium, RLE wounds. He required a volume targeted ventilator to use at night due to his severe obstructive sleep apnea. 2D echo 08/14/24 showed EF 70-75%, moderate RV dysfunction, severely enlarged RV, severe pHTN, mild LAE, moderate RAE, mild-moderate TR, 41mm ascending aorta. TEE 08/17/24 for bacteremia reported EF 60-65%, ?normal RV, mildly enlarged RV, mild LAE, mod RAE, mild-moderate TR, mild AI, mild dilation of aortic root, neg bubble study. No vegetations seen. Required PICC at DC. Was discharged on Lasix  40mg  every other day, previously on torsemide  in the past, Cr 0.61 at discharge. He was treated with midodrine  during the admission, but discontinued due to reflex bradycardia.  Also on atenolol  as part of HTN management. Lisinopril  was listed to stop on both last 2 DC summaries. He reports he went home after last admission.  He presented back to the hospital with similar presentation of dyspnea, hypotension and respiratory distress. His NIV had apparently not been working per notes though patient states this was not the case, has been using. Labs again showed recurrent AKI with Cr 2.81, also with continued anemia, thrombocytopenia, elevated pBNP 1825,  Hgb downtrending 10's to 7.8 today prompting transfusion, no signs of ongoing bleeding. Lactic acid was normal. He has recurrently required midodrine  for BP support. Nephrology felt AKI was likely prerenal with acute tubular injury, recommended holding of antihypertensives - atenolol  d/c'd. Received 1 dose IV Lasix  earlier on admission, then transitioned to torsemide  starting 2/1. Cardiology asked to see to weigh in  re: right heart failure and hypotension. He reports he is somewhat disappointed in his office experience because he has  since had multiple admissions. The patient feels much better presently. Edema has improved closer to baseline. Breathing better at rest. Remains on O2. Plans to put mask on at 8pm. SBP 82/42 early AM, subsequent values 101/43-125/44.  Past Medical History:  Diagnosis Date   Adenomatous colon polyp - diminutive 06/2019   no recall needed   Allergy sinus   hay fever   Bilateral lower extremity edema    BPH with lower urinary tract symptoms without urinary obstruction    Cataract    Coronary artery disease    a. s/p BMS to LAD (1998) b. s/p DES to RCA (06/24/14)   DVT (deep venous thrombosis) (HCC)    12/2020 L femoral and poplitea.  Unprovoked.  hypercoag w/u NEG.   Eliquis .   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 ?inclusion body myositis? Power WC as of 2022   Macrocytic anemia    Unknown etiology (bone marrow biopsy benign 2025)   Osteoarthritis, multiple sites    Palatal fistula    Palpable abd. aorta    Aortic u/s NO ANEURISM 2020   RBBB    Spinal stenosis    Dr Royal Bury  surgery   Thrombocytopenia    Uknown etiology (Bone marrow biopsy benign 2025)    Past Surgical History:  Procedure Laterality Date   APPENDECTOMY  2010   BONE MARROW BIOPSY     2025 benign   CERVICAL FUSION  09/2009   CHOLECYSTECTOMY  1998   COLONOSCOPY     2020-->no recall   EYE SURGERY     Lasik   HYDROCELE EXCISION  2005   JOINT REPLACEMENT  05/22/20   right hip   LEFT HEART CATHETERIZATION WITH CORONARY ANGIOGRAM N/A 06/24/2014   Procedure: LEFT HEART CATHETERIZATION WITH CORONARY ANGIOGRAM;  Surgeon: Debby DELENA Sor, MD;  Location: [facility] CATH LAB;  Service: Cardiovascular;  Laterality: N/A;   LUMBAR SPINE SURGERY   11/2011   MUSCLE BIOPSY Left 07/11/2018   Procedure: Left Vastus lateralus muscle biopsy;  Surgeon: Cheryle Debby DELENA, MD;  Location: [facility] OR;  Service: Neurosurgery;  Laterality: Left;  Left Vastus lateralus muscle biopsy   PERCUTANEOUS CORONARY STENT INTERVENTION (PCI-S)  06/24/2014   DES to RCA. Procedure: PERCUTANEOUS CORONARY STENT INTERVENTION (PCI-S);  Surgeon: Debby DELENA Sor, MD;  Location: [facility] CATH LAB;  Service: Cardiovascular;;  Distal RCA   Rhythm monitoring     04/2023, occ pvc's   SPINE SURGERY     TONSILLECTOMY AND ADENOIDECTOMY  2012   TOTAL HIP ARTHROPLASTY Right 2021   RIGHT   TRANSESOPHAGEAL ECHOCARDIOGRAM (CATH LAB) N/A 08/17/2024   Procedure: TRANSESOPHAGEAL ECHOCARDIOGRAM;  Surgeon: Francyne Headland, MD;  Location: [facility] INVASIVE CV LAB;  Service: Cardiovascular;  Laterality: N/A;   TRANSTHORACIC ECHOCARDIOGRAM     03/2019 normal.  Nov 2024 mild RV dysf, o/w normal     Home Medications:  Prior to Admission medications  Medication Sig Start Date End Date Taking? Authorizing Provider  acetaminophen  (TYLENOL ) 325 MG tablet Take 2 tablets (  650 mg total) by mouth every 6 (six) hours as needed (as needed for pain). 07/20/24  Yes Arrien, Elidia Sieving, MD  apixaban  (ELIQUIS ) 2.5 MG TABS tablet Take 1 tablet (2.5 mg total) by mouth 2 (two) times daily. 07/04/23  Yes Okey Vina GAILS, MD  atenolol  (TENORMIN ) 25 MG tablet Take 1 tablet (25 mg total) by mouth 2 (two) times daily. 08/22/24 11/20/24 Yes Maranda Lonni MATSU, MD  b complex vitamins tablet Take 1 tablet by mouth every evening.   Yes [provider]  Cholecalciferol (VITAMIN D-3 PO) Take 1 capsule by mouth daily.   Yes [provider]  Coenzyme Q10 (COQ-10 PO) Take 1 capsule by mouth every evening.   Yes [provider]  ezetimibe  (ZETIA ) 10 MG tablet Take 1 tablet (10 mg total) by mouth daily. Patient taking differently: Take 10 mg by mouth every evening. 05/21/24  Yes Lucien, Tessa N, PA-C  folic  acid (FOLVITE ) 400 MCG tablet Take 400-800 mcg by mouth daily.   Yes [provider]  furosemide  (LASIX ) 40 MG tablet Take 40 mg by mouth every other morning 08/22/24 11/20/24 Yes Maranda Lonni MATSU, MD  guaiFENesin  (ROBITUSSIN) 100 MG/5ML liquid Take 5 mLs by mouth every 4 (four) hours as needed for cough or to loosen phlegm. 08/22/24  Yes Maranda Lonni MATSU, MD  KRILL OIL PO Take 1 capsule by mouth daily.   Yes [provider]  liver oil-zinc  oxide (DESITIN) 40 % ointment Apply 1 Application topically 2 (two) times daily. 08/22/24  Yes Maranda Lonni MATSU, MD  Multiple Vitamin (MULTIVITAMIN WITH MINERALS) TABS tablet Take 1 tablet by mouth daily. 07/21/24  Yes Arrien, Elidia Sieving, MD  rosuvastatin  (CRESTOR ) 5 MG tablet Take 5 mg by mouth daily.   Yes [provider]  tamsulosin  (FLOMAX ) 0.4 MG CAPS capsule Take 1 capsule (0.4 mg total) by mouth every evening. 08/22/24 11/20/24 Yes Maranda Lonni MATSU, MD  thiamine  (VITAMIN B1) 100 MG tablet Take 1 tablet (100 mg total) by mouth daily. 08/23/24 11/21/24 Yes Maranda Lonni MATSU, MD  traZODone  (DESYREL ) 50 MG tablet Take 0.5 tablets (25 mg total) by mouth at bedtime as needed for sleep. 08/22/24 11/20/24 Yes Maranda Lonni MATSU, MD  albuterol  (PROVENTIL ) (2.5 MG/3ML) 0.083% nebulizer solution Take 3 mLs (2.5 mg total) by nebulization every 4 (four) hours as needed for wheezing. Patient not taking: Reported on 08/25/2024 08/22/24 11/20/24  Maranda Lonni MATSU, MD  fluticasone  (FLONASE ) 50 MCG/ACT nasal spray Place 1 spray into both nostrils 2 (two) times daily as needed for allergies or rhinitis. Patient not taking: Reported on 08/25/2024    [provider]  levocetirizine (XYZAL ) 5 MG tablet Take 5 mg by mouth See admin instructions. Take 5 mg by mouth in the evening as needed for allergies Patient not taking: Reported on 08/25/2024    [provider]  potassium chloride  (KLOR-CON ) 10 MEQ tablet Take  10 mEq by mouth every other day.    [provider]    Scheduled Meds:  apixaban   2.5 mg Oral BID   budesonide  (PULMICORT ) nebulizer solution  0.5 mg Nebulization BID   Chlorhexidine  Gluconate Cloth  6 each Topical Daily   ezetimibe   10 mg Oral Daily   feeding supplement  237 mL Oral BID BM   ferrous sulfate   325 mg Oral BID WC   folic acid   1 mg Oral Daily   Gerhardt's butt cream  1 Application Topical BID   hydrocerin   Topical Daily  midodrine   5 mg Oral TID WC   pantoprazole   40 mg Oral Daily   rosuvastatin   5 mg Oral Daily   tamsulosin   0.4 mg Oral QPC supper   thiamine   100 mg Oral Daily   [START ON 08/30/2024] torsemide   20 mg Oral Daily   Continuous Infusions:  PRN Meds: acetaminophen , ALPRAZolam , Muscle Rub, mouth rinse, phenol, polyethylene glycol, senna, sodium chloride  flush  Allergies:   Allergies[1]  Social History:   Social History   Socioeconomic History   Marital status: Married    Spouse name: Not on file   Number of children: Not on file   Years of education: Not on file   Highest education level: Never attended school  Occupational History   Not on file  Tobacco Use   Smoking status: Former    Current packs/day: 0.00    Average packs/day: 0.8 packs/day for 23.0 years (17.3 ttl pk-yrs)    Types: Cigarettes    Start date: 07/26/1966    Quit date: 07/26/1989    Years since quitting: 35.1   Smokeless tobacco: Never  Vaping Use   Vaping status: Never Used  Substance and Sexual Activity   Alcohol use: Yes    Comment: 3-6 cans per week   Drug use: No   Sexual activity: Yes    Partners: Female    Birth control/protection: None    Comment: married  Other Topics Concern   Not on file  Social History Narrative   Married, 2 kids (one is teacher, early years/pre at [employer]).   Educ: HS   Retired dance movement psychotherapist   Patient is a former smoker, quit age 42   Alcohol use - yes, hx beer drinker, has approx 6 pack per week   Drug Use- no    caffeine 3 cups  coffee daily   Enjoys motorcycles   Social Drivers of Health   Tobacco Use: Medium Risk (08/25/2024)   Patient History    Smoking Tobacco Use: Former    Smokeless Tobacco Use: Never    Passive Exposure: Not on file  Financial Resource Strain: Low Risk (06/05/2024)   Overall Financial Resource Strain (CARDIA)    Difficulty of Paying Living Expenses: Not hard at all  Food Insecurity: No Food Insecurity (08/29/2024)   Epic    Worried About Radiation Protection Practitioner of Food in the Last Year: Never true    Ran Out of Food in the Last Year: Never true  Transportation Needs: No Transportation Needs (08/29/2024)   Epic    Lack of Transportation (Medical): No    Lack of Transportation (Non-Medical): No  Physical Activity: Inactive (06/05/2024)   Exercise Vital Sign    Days of Exercise per Week: 0 days    Minutes of Exercise per Session: Not on file  Stress: No Stress Concern Present (06/05/2024)   [employer] of Occupational Health - Occupational Stress Questionnaire    Feeling of Stress: Not at all  Social Connections: Socially Isolated (08/29/2024)   Social Connection and Isolation Panel    Frequency of Communication with Friends and Family: Once a week    Frequency of Social Gatherings with Friends and Family: Once a week    Attends Religious Services: Never    Database Administrator or Organizations: No    Attends Banker Meetings: Never    Marital Status: Married  Catering Manager Violence: Not At Risk (08/29/2024)   Epic    Fear of Current or Ex-Partner: No    Emotionally Abused:  No    Physically Abused: No    Sexually Abused: No  Depression (PHQ2-9): Low Risk (12/28/2023)   Depression (PHQ2-9)    PHQ-2 Score: 4  Alcohol Screen: Low Risk (06/05/2024)   Alcohol Screen    Last Alcohol Screening Score (AUDIT): 4  Housing: Unknown (08/29/2024)   Epic    Unable to Pay for Housing in the Last Year: Not on file    Number of Times Moved in the Last Year: 0    Homeless in the Last  Year: Not on file  Utilities: Not At Risk (08/29/2024)   Epic    Threatened with loss of utilities: No  Health Literacy: Adequate Health Literacy (12/28/2023)   B1300 Health Literacy    Frequency of need for help with medical instructions: Never    Family History:    Family History  Problem Relation Age of Onset   Heart attack Mother    Esophageal cancer Mother    Cancer Mother    Aneurysm Father    Heart disease Father    Early death Father    Cystic fibrosis Brother        no prior health issues, rare form of cystic fibrosis of the lungs 08-Mar-2025 09-08-14)   Early death Brother    Stroke Neg Hx      ROS:  Please see the history of present illness.   All other ROS reviewed and negative.     Physical Exam/Data: Vitals:   08/29/24 1210 08/29/24 1408 08/29/24 1425 08/29/24 1554  BP: (!) 112/41 104/62 (!) 101/43 (!) 125/44  Pulse: 84 (!) 109 93   Resp: 19 (!) 31 20   Temp:  97.7 F (36.5 C) 98 F (36.7 C) 98.3 F (36.8 C)  TempSrc:  Oral Oral Oral  SpO2: 99% 100% 100%   Weight:      Height:       No intake or output data in the 24 hours ending 08/29/24 1736    08/27/2024    4:14 AM 08/26/2024    5:00 AM 08/25/2024    2:26 AM  Last 3 Weights  Weight (lbs) 247 lb 2.2 oz 245 lb 2.4 oz 264 lb 8.8 oz  Weight (kg) 112.1 kg 111.2 kg 120 kg     Body mass index is 34.47 kg/m.  General: Pale chronically ill appearing WM in no acute distress. Sitting up working on laptop on O2. Head: Normocephalic, atraumatic, sclera non-icteric, no xanthomas, nares are without discharge. Neck: Negative for carotid bruits. JVP not elevated. Lungs: Diffusely diminished B/L wheezes, rales, or rhonchi. Breathing is unlabored. Heart: RRR S1 S2 without murmurs, rubs, or gallops.  Abdomen: Soft, non-tender, non-distended with normoactive bowel sounds. No rebound/guarding. Extremities: No clubbing or cyanosis. Mild RLE edema compared to LLE. Both are wrapped Neuro: Alert and oriented X 3. Moves all  extremities spontaneously. Psych:  Responds to questions appropriately with a normal affect.   EKG:  The EKG was personally reviewed and demonstrates:  NSR, 70bpm, first degree AVB+RBBB similar to prior Telemetry:  Telemetry was personally reviewed and demonstrates:  NSR rare PVC  Relevant CV Studies: TEE 08/17/24   1. Left ventricular ejection fraction, by estimation, is 60 to 65%. The  left ventricle has normal function. The left ventricle has no regional  wall motion abnormalities.   2. Right ventricular systolic function is normal. The right ventricular  size is mildly enlarged. Tricuspid regurgitation signal is inadequate for  assessing PA pressure.   3. Left atrial  size was mildly dilated. No left atrial/left atrial  appendage thrombus was detected.   4. Right atrial size was moderately dilated.   5. The mitral valve is normal in structure. No evidence of mitral valve  regurgitation. No evidence of mitral stenosis.   6. Tricuspid valve regurgitation is mild to moderate.   7. The aortic valve is tricuspid. There is mild calcification of the  aortic valve. Aortic valve regurgitation is mild. Aortic valve sclerosis  is present, with no evidence of aortic valve stenosis.   8. Aortic dilatation noted. There is mild dilatation of the aortic root,  measuring 42 mm.   9. Agitated saline contrast bubble study was negative, with no evidence  of any interatrial shunt.   Conclusion(s)/Recommendation(s): No evidence of vegetation/infective  endocarditis on this transesophageael echocardiogram.   2d echo 08/14/24   1. Left ventricular ejection fraction, by estimation, is 70 to 75%. The  left ventricle has hyperdynamic function. The left ventricle has no  regional wall motion abnormalities. Left ventricular diastolic parameters  are indeterminate.   2. Right ventricular systolic function is moderately reduced. The right  ventricular size is severely enlarged. Mildly increased right  ventricular  wall thickness. There is severely elevated pulmonary artery systolic  pressure. The estimated right  ventricular systolic pressure is 66.0 mmHg.   3. Left atrial size was mildly dilated.   4. Right atrial size was moderately dilated.   5. The mitral valve is grossly normal. No evidence of mitral valve  regurgitation. No evidence of mitral stenosis.   6. Tricuspid valve regurgitation is mild to moderate.   7. The aortic valve is grossly normal. Aortic valve regurgitation is not  visualized.   8. Aortic dilatation noted. There is mild dilatation of the ascending  aorta, measuring 41 mm.   9. The inferior vena cava is dilated in size with <50% respiratory  variability, suggesting right atrial pressure of 15 mmHg.   Comparison(s): Prior images reviewed side by side. Changes from prior  study are noted. Right ventricular size has increased and right  ventricular function has decreased.   Laboratory Data: High Sensitivity Troponin:  No results for input(s): TROPONINIHS in the last 720 hours.  Recent Labs  Lab 08/13/24 1234 08/13/24 1738  TRNPT 306* 261*      Chemistry Recent Labs  Lab 08/27/24 0349 08/28/24 0312 08/29/24 0359  NA 140 139 140  K 3.8 3.7 3.9  CL 99 99 100  CO2 33* 34* 37*  GLUCOSE 89 99 84  BUN 25* 31* 31*  CREATININE 1.06 1.17 0.93  CALCIUM  8.5* 8.6* 8.7*  MG 2.0 2.1 1.9  GFRNONAA >60 >60 >60  ANIONGAP 9 6 4*    No results for input(s): PROT, ALBUMIN , AST, ALT, ALKPHOS, BILITOT in the last 168 hours. Lipids No results for input(s): CHOL, TRIG, HDL, LABVLDL, LDLCALC, CHOLHDL in the last 168 hours.  Hematology Recent Labs  Lab 08/27/24 0349 08/28/24 0312 08/29/24 0359  WBC 6.2 5.8 4.4  RBC 2.41* 2.45* 2.22*  2.26*  HGB 8.4* 8.5* 7.8*  HCT 26.3* 26.5* 24.7*  MCV 109.1* 108.2* 111.3*  MCH 34.9* 34.7* 35.1*  MCHC 31.9 32.1 31.6  RDW 13.9 13.8 14.1  PLT 122* 133* 122*   Thyroid   Recent Labs  Lab  08/29/24 0359  TSH 1.150  FREET4 1.33    BNP Recent Labs  Lab 08/25/24 0325 08/28/24 0312  PROBNP 1,825.0* 1,027.0*    DDimer No results for input(s): DDIMER in the last 168  hours.  Radiology/Studies:  DG Chest Port 1 View Result Date: 08/28/2024 EXAM: 1 VIEW(S) XRAY OF THE CHEST 08/28/2024 06:38:00 AM COMPARISON: Portable chest 08/25/2024. CLINICAL HISTORY: SOB (shortness of breath) [id number] shortness of breath. FINDINGS: LINES, TUBES AND DEVICES: Telemetry leads overlie the chest. Right PICC again noted with the tip at the brachiocephalic/SVC junction. LUNGS AND PLEURA: Chronic elevated right hemidiaphragm with colonic interposition. Patchy airspace disease continues in the lower lung fields, not well seen on the right due to the elevated hemidiaphragm. There are small pleural effusions. No pneumothorax. HEART AND MEDIASTINUM: Stable cardiomegaly and mild central vascular prominence. Stable mediastinum with aortic atherosclerosis. BONES AND SOFT TISSUES: Moderate thoracic spondylosis. No acute osseous abnormality. IMPRESSION: 1. Patchy airspace disease in the lower lung fields, not well seen on the right due to the elevated hemidiaphragm. Overall aeration seems unchanged. 2. Small pleural effusions. 3. Stable cardiomegaly and mild central vascular prominence. Electronically signed by: Francis Quam MD 08/28/2024 07:12 AM EST RP Workstation: [id number]   US  RENAL Result Date: 08/26/2024 EXAM: RETROPERITONEAL ULTRASOUND OF THE KIDNEYS 08/26/2024 09:58:44 PM TECHNIQUE: Real-time ultrasonography of the retroperitoneum, specifically the kidneys and urinary bladder, was performed. Evaluation of the kidneys is limited by body habitus. Patient unable to hold breath. COMPARISON: US  Renal 07/09/2024 and CT 07/10/2024. CLINICAL HISTORY: Acute kidney injury. FINDINGS: RIGHT KIDNEY: Right kidney measures 11.0 cm in length. Normal cortical echogenicity. No hydronephrosis. No calculus. No mass. Right ureteral  jet is not visualized. LEFT KIDNEY: Left kidney measures 12.4 cm in length. Normal cortical echogenicity. No hydronephrosis. No calculus. No mass. Left ureteral jet is not visualized. BLADDER: Unremarkable appearance of the bladder. IMPRESSION: 1. No acute renal abnormality. Electronically signed by: Norman Gatlin MD 08/26/2024 10:08 PM EST RP Workstation: [id number]     Assessment and Plan:  1. Recurrent AKI in the setting of hypotension 2. Acute on chronic hypoxic and hypercarbic respiratory failure felt multifactorial 3. Severe OSA on home NIV 4. Chronic HFpEF with RV dysfunction 5. Chronic lymphedema 6. Abnormal chest CT with chronic prominent elevation of the right hemidiaphragm, complete right middle lobe and near complete right lower lobe atelectasis - very complex, multifactorial process, with very narrow line between AKI/diuresis, hypotension and adequate perfusion - will review RV findings with MD - seems that surface echo 1/20 reported this was more significant than seen on TEE 1/23 - anticipate treatment of underlying hypoxia/OSA as mainstay of therapy - avoid atenolol  going forward - not a great BB in a patient that may have a predisposition to decreased renal clearance - avoid lisinopril  going forward - continue midodrine  for now - watch for bradycardia - ? Consider pulm input regarding abnormal chest CT with marked atelectasis in 06/2024 - may need to ask AHF team to weigh in on RV support in AM if needed going forward, query need for RHC  5. Hx of DVT 2022, 2023 - RLE duplex neg 06/2024 - apixaban  dose reduced to 2.5mg  BID by heme-onc in 02/2022  - hold on dose escalation due to worsening anemia, would review trends, unclear if utility in re-evaluating for PE  6. Anemia of chronic disease - s/p transfusion per IM  7. PVCs in 2024 monitor - rare by monitor here  8. Mildly dilated aorta - no intervention needed at this time  9. Mild-moderate TR, mild AI - follow  clinically  Remainder per primary   Risk Assessment/Risk Scores:       [place]  Heart Association (NYHA) Functional Class NYHA Class IV  For questions or updates, please contact [facility] Please consult [email or web address] for contact info under      Signed, Raphael LOISE Bring, PA-C  08/29/2024 5:36 PM     [1] No Known Allergies  "

## 2024-08-29 NOTE — H&P (View-Only) (Signed)
 "  Cardiology Consultation   Patient ID: [name] MRN: [id number]; DOB: [birth year]  Admit date: 08/25/2024 Date of Consult: 08/29/2024  PCP:  Candise Aleene DEL, MD   [facility] Providers Cardiologist:  Vina Gull, MD        Patient Profile: [name] is a 74 y.o. 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 Dennise.  History of Present Illness: Mr. Thrush follows with Dr. Gull with history of the above. His last cath was in 2015 with patent BMS to prox LAD, 20-39% prox RCA, 20-30% eccentric narrowing followed by 99% prox acute marginal -> s/p DES to RCA. He had prior LLE DVT dx 2022, started on anticoagulation, as well as duplex in 2023 by vascular showing what they felt was chronic appearing thrombus in the femoral/popliteal vein (OV note reports this was R rather than L as per report). Repeat RLE venous duplex 06/2024 neg for DVT. Prior monitor 2024 showed NSR 69bpm, range 44-102bpm, rare PACs, 6% PVCs. He historically has struggled with LE edema requiring referral to the lymphedema clinic. Notes indicate this has persisted even with diuresis. Echo 2024 showed normal LV, mildly reduced RVSF.  This is his 3rd admission in the last 2 months. 12/15-12/26/25 was admitted for RLE cellulitis, a/c CHF, AKI, impending rhabdomyolysis, septic shock, hypothermic with lethargy, abnormal CT chest with elevated R hemidiaphragm, complete RML/near complete RLL atelectasis, 4.1cm thoracic  aortic dilation. He was readmitted 1/19-1/28/26 with fall, found to have septic shock requiring pressors, MSSA bacteremia felt secondary to sacral wounds, staphylococcus epidermidis bacteremia, AKI, anasarca, elevated LFTs, delirium, RLE wounds. He required a volume targeted ventilator to use at night due to his severe obstructive sleep apnea. 2D echo 08/14/24 showed EF 70-75%, moderate RV dysfunction, severely enlarged RV, severe pHTN, mild LAE, moderate RAE, mild-moderate TR, 41mm ascending aorta. TEE 08/17/24 for bacteremia reported EF 60-65%, ?normal RV, mildly enlarged RV, mild LAE, mod RAE, mild-moderate TR, mild AI, mild dilation of aortic root, neg bubble study. No vegetations seen. Required PICC at DC. Was discharged on Lasix  40mg  every other day, previously on torsemide  in the past, Cr 0.61 at discharge. He was treated with midodrine  during the admission, but discontinued due to reflex bradycardia.  Also on atenolol  as part of HTN management. Lisinopril  was listed to stop on both last 2 DC summaries. He reports he went home after last admission.  He presented back to the hospital with similar presentation of dyspnea, hypotension and respiratory distress. His NIV had apparently not been working per notes though patient states this was not the case, has been using. Labs again showed recurrent AKI with Cr 2.81, also with continued anemia, thrombocytopenia, elevated pBNP 1825,  Hgb downtrending 10's to 7.8 today prompting transfusion, no signs of ongoing bleeding. Lactic acid was normal. He has recurrently required midodrine  for BP support. Nephrology felt AKI was likely prerenal with acute tubular injury, recommended holding of antihypertensives - atenolol  d/c'd. Received 1 dose IV Lasix  earlier on admission, then transitioned to torsemide  starting 2/1. Cardiology asked to see to weigh in  re: right heart failure and hypotension. He reports he is somewhat disappointed in his office experience because he has  since had multiple admissions. The patient feels much better presently. Edema has improved closer to baseline. Breathing better at rest. Remains on O2. Plans to put mask on at 8pm. SBP 82/42 early AM, subsequent values 101/43-125/44.  Past Medical History:  Diagnosis Date   Adenomatous colon polyp - diminutive 06/2019   no recall needed   Allergy sinus   hay fever   Bilateral lower extremity edema    BPH with lower urinary tract symptoms without urinary obstruction    Cataract    Coronary artery disease    a. s/p BMS to LAD (1998) b. s/p DES to RCA (06/24/14)   DVT (deep venous thrombosis) (HCC)    12/2020 L femoral and poplitea.  Unprovoked.  hypercoag w/u NEG.   Eliquis .   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 ?inclusion body myositis? Power WC as of 2022   Macrocytic anemia    Unknown etiology (bone marrow biopsy benign 2025)   Osteoarthritis, multiple sites    Palatal fistula    Palpable abd. aorta    Aortic u/s NO ANEURISM 2020   RBBB    Spinal stenosis    Dr Royal Bury  surgery   Thrombocytopenia    Uknown etiology (Bone marrow biopsy benign 2025)    Past Surgical History:  Procedure Laterality Date   APPENDECTOMY  2010   BONE MARROW BIOPSY     2025 benign   CERVICAL FUSION  09/2009   CHOLECYSTECTOMY  1998   COLONOSCOPY     2020-->no recall   EYE SURGERY     Lasik   HYDROCELE EXCISION  2005   JOINT REPLACEMENT  05/22/20   right hip   LEFT HEART CATHETERIZATION WITH CORONARY ANGIOGRAM N/A 06/24/2014   Procedure: LEFT HEART CATHETERIZATION WITH CORONARY ANGIOGRAM;  Surgeon: Debby DELENA Sor, MD;  Location: [facility] CATH LAB;  Service: Cardiovascular;  Laterality: N/A;   LUMBAR SPINE SURGERY   11/2011   MUSCLE BIOPSY Left 07/11/2018   Procedure: Left Vastus lateralus muscle biopsy;  Surgeon: Cheryle Debby DELENA, MD;  Location: [facility] OR;  Service: Neurosurgery;  Laterality: Left;  Left Vastus lateralus muscle biopsy   PERCUTANEOUS CORONARY STENT INTERVENTION (PCI-S)  06/24/2014   DES to RCA. Procedure: PERCUTANEOUS CORONARY STENT INTERVENTION (PCI-S);  Surgeon: Debby DELENA Sor, MD;  Location: [facility] CATH LAB;  Service: Cardiovascular;;  Distal RCA   Rhythm monitoring     04/2023, occ pvc's   SPINE SURGERY     TONSILLECTOMY AND ADENOIDECTOMY  2012   TOTAL HIP ARTHROPLASTY Right 2021   RIGHT   TRANSESOPHAGEAL ECHOCARDIOGRAM (CATH LAB) N/A 08/17/2024   Procedure: TRANSESOPHAGEAL ECHOCARDIOGRAM;  Surgeon: Francyne Headland, MD;  Location: [facility] INVASIVE CV LAB;  Service: Cardiovascular;  Laterality: N/A;   TRANSTHORACIC ECHOCARDIOGRAM     03/2019 normal.  Nov 2024 mild RV dysf, o/w normal     Home Medications:  Prior to Admission medications  Medication Sig Start Date End Date Taking? Authorizing Provider  acetaminophen  (TYLENOL ) 325 MG tablet Take 2 tablets (  650 mg total) by mouth every 6 (six) hours as needed (as needed for pain). 07/20/24  Yes Arrien, Elidia Sieving, MD  apixaban  (ELIQUIS ) 2.5 MG TABS tablet Take 1 tablet (2.5 mg total) by mouth 2 (two) times daily. 07/04/23  Yes Okey Vina GAILS, MD  atenolol  (TENORMIN ) 25 MG tablet Take 1 tablet (25 mg total) by mouth 2 (two) times daily. 08/22/24 11/20/24 Yes Maranda Lonni MATSU, MD  b complex vitamins tablet Take 1 tablet by mouth every evening.   Yes [provider]  Cholecalciferol (VITAMIN D-3 PO) Take 1 capsule by mouth daily.   Yes [provider]  Coenzyme Q10 (COQ-10 PO) Take 1 capsule by mouth every evening.   Yes [provider]  ezetimibe  (ZETIA ) 10 MG tablet Take 1 tablet (10 mg total) by mouth daily. Patient taking differently: Take 10 mg by mouth every evening. 05/21/24  Yes Lucien, Tessa N, PA-C  folic  acid (FOLVITE ) 400 MCG tablet Take 400-800 mcg by mouth daily.   Yes [provider]  furosemide  (LASIX ) 40 MG tablet Take 40 mg by mouth every other morning 08/22/24 11/20/24 Yes Maranda Lonni MATSU, MD  guaiFENesin  (ROBITUSSIN) 100 MG/5ML liquid Take 5 mLs by mouth every 4 (four) hours as needed for cough or to loosen phlegm. 08/22/24  Yes Maranda Lonni MATSU, MD  KRILL OIL PO Take 1 capsule by mouth daily.   Yes [provider]  liver oil-zinc  oxide (DESITIN) 40 % ointment Apply 1 Application topically 2 (two) times daily. 08/22/24  Yes Maranda Lonni MATSU, MD  Multiple Vitamin (MULTIVITAMIN WITH MINERALS) TABS tablet Take 1 tablet by mouth daily. 07/21/24  Yes Arrien, Elidia Sieving, MD  rosuvastatin  (CRESTOR ) 5 MG tablet Take 5 mg by mouth daily.   Yes [provider]  tamsulosin  (FLOMAX ) 0.4 MG CAPS capsule Take 1 capsule (0.4 mg total) by mouth every evening. 08/22/24 11/20/24 Yes Maranda Lonni MATSU, MD  thiamine  (VITAMIN B1) 100 MG tablet Take 1 tablet (100 mg total) by mouth daily. 08/23/24 11/21/24 Yes Maranda Lonni MATSU, MD  traZODone  (DESYREL ) 50 MG tablet Take 0.5 tablets (25 mg total) by mouth at bedtime as needed for sleep. 08/22/24 11/20/24 Yes Maranda Lonni MATSU, MD  albuterol  (PROVENTIL ) (2.5 MG/3ML) 0.083% nebulizer solution Take 3 mLs (2.5 mg total) by nebulization every 4 (four) hours as needed for wheezing. Patient not taking: Reported on 08/25/2024 08/22/24 11/20/24  Maranda Lonni MATSU, MD  fluticasone  (FLONASE ) 50 MCG/ACT nasal spray Place 1 spray into both nostrils 2 (two) times daily as needed for allergies or rhinitis. Patient not taking: Reported on 08/25/2024    [provider]  levocetirizine (XYZAL ) 5 MG tablet Take 5 mg by mouth See admin instructions. Take 5 mg by mouth in the evening as needed for allergies Patient not taking: Reported on 08/25/2024    [provider]  potassium chloride  (KLOR-CON ) 10 MEQ tablet Take  10 mEq by mouth every other day.    [provider]    Scheduled Meds:  apixaban   2.5 mg Oral BID   budesonide  (PULMICORT ) nebulizer solution  0.5 mg Nebulization BID   Chlorhexidine  Gluconate Cloth  6 each Topical Daily   ezetimibe   10 mg Oral Daily   feeding supplement  237 mL Oral BID BM   ferrous sulfate   325 mg Oral BID WC   folic acid   1 mg Oral Daily   Gerhardt's butt cream  1 Application Topical BID   hydrocerin   Topical Daily  midodrine   5 mg Oral TID WC   pantoprazole   40 mg Oral Daily   rosuvastatin   5 mg Oral Daily   tamsulosin   0.4 mg Oral QPC supper   thiamine   100 mg Oral Daily   [START ON 08/30/2024] torsemide   20 mg Oral Daily   Continuous Infusions:  PRN Meds: acetaminophen , ALPRAZolam , Muscle Rub, mouth rinse, phenol, polyethylene glycol, senna, sodium chloride  flush  Allergies:   Allergies[1]  Social History:   Social History   Socioeconomic History   Marital status: Married    Spouse name: Not on file   Number of children: Not on file   Years of education: Not on file   Highest education level: Never attended school  Occupational History   Not on file  Tobacco Use   Smoking status: Former    Current packs/day: 0.00    Average packs/day: 0.8 packs/day for 23.0 years (17.3 ttl pk-yrs)    Types: Cigarettes    Start date: 07/26/1966    Quit date: 07/26/1989    Years since quitting: 35.1   Smokeless tobacco: Never  Vaping Use   Vaping status: Never Used  Substance and Sexual Activity   Alcohol use: Yes    Comment: 3-6 cans per week   Drug use: No   Sexual activity: Yes    Partners: Female    Birth control/protection: None    Comment: married  Other Topics Concern   Not on file  Social History Narrative   Married, 2 kids (one is teacher, early years/pre at [employer]).   Educ: HS   Retired dance movement psychotherapist   Patient is a former smoker, quit age 42   Alcohol use - yes, hx beer drinker, has approx 6 pack per week   Drug Use- no    caffeine 3 cups  coffee daily   Enjoys motorcycles   Social Drivers of Health   Tobacco Use: Medium Risk (08/25/2024)   Patient History    Smoking Tobacco Use: Former    Smokeless Tobacco Use: Never    Passive Exposure: Not on file  Financial Resource Strain: Low Risk (06/05/2024)   Overall Financial Resource Strain (CARDIA)    Difficulty of Paying Living Expenses: Not hard at all  Food Insecurity: No Food Insecurity (08/29/2024)   Epic    Worried About Radiation Protection Practitioner of Food in the Last Year: Never true    Ran Out of Food in the Last Year: Never true  Transportation Needs: No Transportation Needs (08/29/2024)   Epic    Lack of Transportation (Medical): No    Lack of Transportation (Non-Medical): No  Physical Activity: Inactive (06/05/2024)   Exercise Vital Sign    Days of Exercise per Week: 0 days    Minutes of Exercise per Session: Not on file  Stress: No Stress Concern Present (06/05/2024)   [employer] of Occupational Health - Occupational Stress Questionnaire    Feeling of Stress: Not at all  Social Connections: Socially Isolated (08/29/2024)   Social Connection and Isolation Panel    Frequency of Communication with Friends and Family: Once a week    Frequency of Social Gatherings with Friends and Family: Once a week    Attends Religious Services: Never    Database Administrator or Organizations: No    Attends Banker Meetings: Never    Marital Status: Married  Catering Manager Violence: Not At Risk (08/29/2024)   Epic    Fear of Current or Ex-Partner: No    Emotionally Abused:  No    Physically Abused: No    Sexually Abused: No  Depression (PHQ2-9): Low Risk (12/28/2023)   Depression (PHQ2-9)    PHQ-2 Score: 4  Alcohol Screen: Low Risk (06/05/2024)   Alcohol Screen    Last Alcohol Screening Score (AUDIT): 4  Housing: Unknown (08/29/2024)   Epic    Unable to Pay for Housing in the Last Year: Not on file    Number of Times Moved in the Last Year: 0    Homeless in the Last  Year: Not on file  Utilities: Not At Risk (08/29/2024)   Epic    Threatened with loss of utilities: No  Health Literacy: Adequate Health Literacy (12/28/2023)   B1300 Health Literacy    Frequency of need for help with medical instructions: Never    Family History:    Family History  Problem Relation Age of Onset   Heart attack Mother    Esophageal cancer Mother    Cancer Mother    Aneurysm Father    Heart disease Father    Early death Father    Cystic fibrosis Brother        no prior health issues, rare form of cystic fibrosis of the lungs 08-Mar-2025 09-08-14)   Early death Brother    Stroke Neg Hx      ROS:  Please see the history of present illness.   All other ROS reviewed and negative.     Physical Exam/Data: Vitals:   08/29/24 1210 08/29/24 1408 08/29/24 1425 08/29/24 1554  BP: (!) 112/41 104/62 (!) 101/43 (!) 125/44  Pulse: 84 (!) 109 93   Resp: 19 (!) 31 20   Temp:  97.7 F (36.5 C) 98 F (36.7 C) 98.3 F (36.8 C)  TempSrc:  Oral Oral Oral  SpO2: 99% 100% 100%   Weight:      Height:       No intake or output data in the 24 hours ending 08/29/24 1736    08/27/2024    4:14 AM 08/26/2024    5:00 AM 08/25/2024    2:26 AM  Last 3 Weights  Weight (lbs) 247 lb 2.2 oz 245 lb 2.4 oz 264 lb 8.8 oz  Weight (kg) 112.1 kg 111.2 kg 120 kg     Body mass index is 34.47 kg/m.  General: Pale chronically ill appearing WM in no acute distress. Sitting up working on laptop on O2. Head: Normocephalic, atraumatic, sclera non-icteric, no xanthomas, nares are without discharge. Neck: Negative for carotid bruits. JVP not elevated. Lungs: Diffusely diminished B/L wheezes, rales, or rhonchi. Breathing is unlabored. Heart: RRR S1 S2 without murmurs, rubs, or gallops.  Abdomen: Soft, non-tender, non-distended with normoactive bowel sounds. No rebound/guarding. Extremities: No clubbing or cyanosis. Mild RLE edema compared to LLE. Both are wrapped Neuro: Alert and oriented X 3. Moves all  extremities spontaneously. Psych:  Responds to questions appropriately with a normal affect.   EKG:  The EKG was personally reviewed and demonstrates:  NSR, 70bpm, first degree AVB+RBBB similar to prior Telemetry:  Telemetry was personally reviewed and demonstrates:  NSR rare PVC  Relevant CV Studies: TEE 08/17/24   1. Left ventricular ejection fraction, by estimation, is 60 to 65%. The  left ventricle has normal function. The left ventricle has no regional  wall motion abnormalities.   2. Right ventricular systolic function is normal. The right ventricular  size is mildly enlarged. Tricuspid regurgitation signal is inadequate for  assessing PA pressure.   3. Left atrial  size was mildly dilated. No left atrial/left atrial  appendage thrombus was detected.   4. Right atrial size was moderately dilated.   5. The mitral valve is normal in structure. No evidence of mitral valve  regurgitation. No evidence of mitral stenosis.   6. Tricuspid valve regurgitation is mild to moderate.   7. The aortic valve is tricuspid. There is mild calcification of the  aortic valve. Aortic valve regurgitation is mild. Aortic valve sclerosis  is present, with no evidence of aortic valve stenosis.   8. Aortic dilatation noted. There is mild dilatation of the aortic root,  measuring 42 mm.   9. Agitated saline contrast bubble study was negative, with no evidence  of any interatrial shunt.   Conclusion(s)/Recommendation(s): No evidence of vegetation/infective  endocarditis on this transesophageael echocardiogram.   2d echo 08/14/24   1. Left ventricular ejection fraction, by estimation, is 70 to 75%. The  left ventricle has hyperdynamic function. The left ventricle has no  regional wall motion abnormalities. Left ventricular diastolic parameters  are indeterminate.   2. Right ventricular systolic function is moderately reduced. The right  ventricular size is severely enlarged. Mildly increased right  ventricular  wall thickness. There is severely elevated pulmonary artery systolic  pressure. The estimated right  ventricular systolic pressure is 66.0 mmHg.   3. Left atrial size was mildly dilated.   4. Right atrial size was moderately dilated.   5. The mitral valve is grossly normal. No evidence of mitral valve  regurgitation. No evidence of mitral stenosis.   6. Tricuspid valve regurgitation is mild to moderate.   7. The aortic valve is grossly normal. Aortic valve regurgitation is not  visualized.   8. Aortic dilatation noted. There is mild dilatation of the ascending  aorta, measuring 41 mm.   9. The inferior vena cava is dilated in size with <50% respiratory  variability, suggesting right atrial pressure of 15 mmHg.   Comparison(s): Prior images reviewed side by side. Changes from prior  study are noted. Right ventricular size has increased and right  ventricular function has decreased.   Laboratory Data: High Sensitivity Troponin:  No results for input(s): TROPONINIHS in the last 720 hours.  Recent Labs  Lab 08/13/24 1234 08/13/24 1738  TRNPT 306* 261*      Chemistry Recent Labs  Lab 08/27/24 0349 08/28/24 0312 08/29/24 0359  NA 140 139 140  K 3.8 3.7 3.9  CL 99 99 100  CO2 33* 34* 37*  GLUCOSE 89 99 84  BUN 25* 31* 31*  CREATININE 1.06 1.17 0.93  CALCIUM  8.5* 8.6* 8.7*  MG 2.0 2.1 1.9  GFRNONAA >60 >60 >60  ANIONGAP 9 6 4*    No results for input(s): PROT, ALBUMIN , AST, ALT, ALKPHOS, BILITOT in the last 168 hours. Lipids No results for input(s): CHOL, TRIG, HDL, LABVLDL, LDLCALC, CHOLHDL in the last 168 hours.  Hematology Recent Labs  Lab 08/27/24 0349 08/28/24 0312 08/29/24 0359  WBC 6.2 5.8 4.4  RBC 2.41* 2.45* 2.22*  2.26*  HGB 8.4* 8.5* 7.8*  HCT 26.3* 26.5* 24.7*  MCV 109.1* 108.2* 111.3*  MCH 34.9* 34.7* 35.1*  MCHC 31.9 32.1 31.6  RDW 13.9 13.8 14.1  PLT 122* 133* 122*   Thyroid   Recent Labs  Lab  08/29/24 0359  TSH 1.150  FREET4 1.33    BNP Recent Labs  Lab 08/25/24 0325 08/28/24 0312  PROBNP 1,825.0* 1,027.0*    DDimer No results for input(s): DDIMER in the last 168  hours.  Radiology/Studies:  DG Chest Port 1 View Result Date: 08/28/2024 EXAM: 1 VIEW(S) XRAY OF THE CHEST 08/28/2024 06:38:00 AM COMPARISON: Portable chest 08/25/2024. CLINICAL HISTORY: SOB (shortness of breath) [id number] shortness of breath. FINDINGS: LINES, TUBES AND DEVICES: Telemetry leads overlie the chest. Right PICC again noted with the tip at the brachiocephalic/SVC junction. LUNGS AND PLEURA: Chronic elevated right hemidiaphragm with colonic interposition. Patchy airspace disease continues in the lower lung fields, not well seen on the right due to the elevated hemidiaphragm. There are small pleural effusions. No pneumothorax. HEART AND MEDIASTINUM: Stable cardiomegaly and mild central vascular prominence. Stable mediastinum with aortic atherosclerosis. BONES AND SOFT TISSUES: Moderate thoracic spondylosis. No acute osseous abnormality. IMPRESSION: 1. Patchy airspace disease in the lower lung fields, not well seen on the right due to the elevated hemidiaphragm. Overall aeration seems unchanged. 2. Small pleural effusions. 3. Stable cardiomegaly and mild central vascular prominence. Electronically signed by: Francis Quam MD 08/28/2024 07:12 AM EST RP Workstation: [id number]   US  RENAL Result Date: 08/26/2024 EXAM: RETROPERITONEAL ULTRASOUND OF THE KIDNEYS 08/26/2024 09:58:44 PM TECHNIQUE: Real-time ultrasonography of the retroperitoneum, specifically the kidneys and urinary bladder, was performed. Evaluation of the kidneys is limited by body habitus. Patient unable to hold breath. COMPARISON: US  Renal 07/09/2024 and CT 07/10/2024. CLINICAL HISTORY: Acute kidney injury. FINDINGS: RIGHT KIDNEY: Right kidney measures 11.0 cm in length. Normal cortical echogenicity. No hydronephrosis. No calculus. No mass. Right ureteral  jet is not visualized. LEFT KIDNEY: Left kidney measures 12.4 cm in length. Normal cortical echogenicity. No hydronephrosis. No calculus. No mass. Left ureteral jet is not visualized. BLADDER: Unremarkable appearance of the bladder. IMPRESSION: 1. No acute renal abnormality. Electronically signed by: Norman Gatlin MD 08/26/2024 10:08 PM EST RP Workstation: [id number]     Assessment and Plan:  1. Recurrent AKI in the setting of hypotension 2. Acute on chronic hypoxic and hypercarbic respiratory failure felt multifactorial 3. Severe OSA on home NIV 4. Chronic HFpEF with RV dysfunction 5. Chronic lymphedema 6. Abnormal chest CT with chronic prominent elevation of the right hemidiaphragm, complete right middle lobe and near complete right lower lobe atelectasis - very complex, multifactorial process, with very narrow line between AKI/diuresis, hypotension and adequate perfusion - will review RV findings with MD - seems that surface echo 1/20 reported this was more significant than seen on TEE 1/23 - anticipate treatment of underlying hypoxia/OSA as mainstay of therapy - avoid atenolol  going forward - not a great BB in a patient that may have a predisposition to decreased renal clearance - avoid lisinopril  going forward - continue midodrine  for now - watch for bradycardia - ? Consider pulm input regarding abnormal chest CT with marked atelectasis in 06/2024 - may need to ask AHF team to weigh in on RV support in AM if needed going forward, query need for RHC  5. Hx of DVT 2022, 2023 - RLE duplex neg 06/2024 - apixaban  dose reduced to 2.5mg  BID by heme-onc in 02/2022  - hold on dose escalation due to worsening anemia, would review trends, unclear if utility in re-evaluating for PE  6. Anemia of chronic disease - s/p transfusion per IM  7. PVCs in 2024 monitor - rare by monitor here  8. Mildly dilated aorta - no intervention needed at this time  9. Mild-moderate TR, mild AI - follow  clinically  Remainder per primary   Risk Assessment/Risk Scores:       [place]  Heart Association (NYHA) Functional Class NYHA Class IV  For questions or updates, please contact [facility] Please consult [email or web address] for contact info under      Signed, Raphael LOISE Bring, PA-C  08/29/2024 5:36 PM     [1] No Known Allergies  "

## 2024-08-29 NOTE — Care Management Important Message (Signed)
 Important Message  Patient Details  Name: [name] MRN: [id number] Date of Birth: 1950-10-28   Important Message Given:  Yes - Medicare IM     Claretta Deed 08/29/2024, 4:11 PM

## 2024-08-29 NOTE — Progress Notes (Signed)
 Physical Therapy Treatment Patient Details Name: [name] MRN: [id number] DOB: Mar 02, 1951 Today's Date: 08/29/2024   History of Present Illness Pt is a 74 y/o male presenting with dyspnea and hypotension in setting of hypoxic/hypercarbic respiratory failure. Pt with MSSA bacteremia from sacral wounds. Also developed AKI. Recent admission 1/19-1/25/26 for acute on chronic hypoxia, hypercapnia and sepsis. PMH: CAD, HTN, HLD, inclusion body myositis, DVT, chonic LB edema, paralyzed Rt hemidiaphragm, morbid obesity    PT Comments  Pt received in supine and agreeable to session. Pt able to sit to EOB with CGA, but requires increased assist for BLE when returning to supine. Pt able to tolerate 2 standing trials at EOB and side steps towards [facility] with up to min A. Pt demonstrates difficulty reaching an upright posture due to back pain, which pt reports in baseline but worsens with decreased mobility. Pt declines sitting in the recliner despite encouragement. Pt continues to benefit from PT services to progress toward functional mobility goals.     If plan is discharge home, recommend the following: A little help with walking and/or transfers;Assist for transportation;Assistance with cooking/housework   Can travel by private vehicle        Equipment Recommendations  None recommended by PT    Recommendations for Other Services       Precautions / Restrictions Precautions Precautions: Fall Recall of Precautions/Restrictions: Intact Precaution/Restrictions Comments: monitor sats/HR Restrictions Weight Bearing Restrictions Per Provider Order: No     Mobility  Bed Mobility Overal bed mobility: Needs Assistance Bed Mobility: Supine to Sit     Supine to sit: Contact guard Sit to supine: Mod assist   General bed mobility comments: assist for BLE elevation back to EOB    Transfers Overall transfer level: Needs assistance Equipment used: Rolling walker (2 wheels) Transfers: Sit to/from  Stand Sit to Stand: Min assist, From elevated surface           General transfer comment: STS x2 from elevated EOB with min A for power up. Difficulty reaching upright posture due to back pain and pt tending to rest forearms on RW. Pt able to side step to [facility] with CGA for safety    Ambulation/Gait                   Stairs             Wheelchair Mobility     Tilt Bed    Modified Rankin (Stroke Patients Only)       Balance Overall balance assessment: Needs assistance, History of Falls Sitting-balance support: Feet supported, No upper extremity supported Sitting balance-Leahy Scale: Fair     Standing balance support: During functional activity, Reliant on assistive device for balance, Bilateral upper extremity supported Standing balance-Leahy Scale: Poor Standing balance comment: reliant on RW support                            Communication Communication Communication: Impaired Factors Affecting Communication: Hearing impaired  Cognition Arousal: Alert Behavior During Therapy: WFL for tasks assessed/performed   PT - Cognitive impairments: No apparent impairments                         Following commands: Intact      Cueing Cueing Techniques: Verbal cues, Tactile cues, Visual cues  Exercises      General Comments        Pertinent Vitals/Pain Pain Assessment Pain Assessment: Faces  Faces Pain Scale: Hurts a little bit Pain Location: neck,back Pain Descriptors / Indicators: Discomfort, Aching Pain Intervention(s): Monitored during session, Limited activity within patient's tolerance     PT Goals (current goals can now be found in the care plan section) Acute Rehab PT Goals Patient Stated Goal: to return home to PLOF PT Goal Formulation: With patient Time For Goal Achievement: 09/10/24 Progress towards PT goals: Progressing toward goals    Frequency    Min 2X/week       AM-PAC PT 6 Clicks Mobility    Outcome Measure  Help needed turning from your back to your side while in a flat bed without using bedrails?: A Little Help needed moving from lying on your back to sitting on the side of a flat bed without using bedrails?: A Little Help needed moving to and from a bed to a chair (including a wheelchair)?: A Little Help needed standing up from a chair using your arms (e.g., wheelchair or bedside chair)?: A Little Help needed to walk in hospital room?: Total Help needed climbing 3-5 steps with a railing? : Total 6 Click Score: 14    End of Session Equipment Utilized During Treatment: Oxygen;Gait belt Activity Tolerance: Patient tolerated treatment well;Patient limited by fatigue Patient left: with call bell/phone within reach;in bed;with bed alarm set Nurse Communication: Mobility status PT Visit Diagnosis: Unsteadiness on feet (R26.81);Other abnormalities of gait and mobility (R26.89);Repeated falls (R29.6);History of falling (Z91.81);Muscle weakness (generalized) (M62.81);Difficulty in walking, not elsewhere classified (R26.2);Pain     Time: 9161-9096 PT Time Calculation (min) (ACUTE ONLY): 25 min  Charges:    $Therapeutic Activity: 23-37 mins PT General Charges $$ ACUTE PT VISIT: 1 Visit                    Darryle George, PTA Acute Rehabilitation Services Secure Chat Preferred  [phone]    Darryle George 08/29/2024, 10:45 AM

## 2024-08-30 ENCOUNTER — Encounter (HOSPITAL_COMMUNITY): Admission: EM | Payer: Self-pay | Source: Home / Self Care | Attending: Pulmonary Disease

## 2024-08-30 LAB — CBC WITH DIFFERENTIAL/PLATELET
Abs Immature Granulocytes: 0.04 10*3/uL (ref 0.00–0.07)
Basophils Absolute: 0 10*3/uL (ref 0.0–0.1)
Basophils Relative: 0 %
Eosinophils Absolute: 0.2 10*3/uL (ref 0.0–0.5)
Eosinophils Relative: 5 %
HCT: 26.3 % — ABNORMAL LOW (ref 39.0–52.0)
Hemoglobin: 8.4 g/dL — ABNORMAL LOW (ref 13.0–17.0)
Immature Granulocytes: 1 %
Lymphocytes Relative: 14 %
Lymphs Abs: 0.6 10*3/uL — ABNORMAL LOW (ref 0.7–4.0)
MCH: 34.6 pg — ABNORMAL HIGH (ref 26.0–34.0)
MCHC: 31.9 g/dL (ref 30.0–36.0)
MCV: 108.2 fL — ABNORMAL HIGH (ref 80.0–100.0)
Monocytes Absolute: 0.6 10*3/uL (ref 0.1–1.0)
Monocytes Relative: 14 %
Neutro Abs: 2.6 10*3/uL (ref 1.7–7.7)
Neutrophils Relative %: 66 %
Platelets: 109 10*3/uL — ABNORMAL LOW (ref 150–400)
RBC: 2.43 MIL/uL — ABNORMAL LOW (ref 4.22–5.81)
RDW: 16.7 % — ABNORMAL HIGH (ref 11.5–15.5)
WBC: 3.9 10*3/uL — ABNORMAL LOW (ref 4.0–10.5)
nRBC: 0 % (ref 0.0–0.2)

## 2024-08-30 LAB — POCT I-STAT EG7
Acid-Base Excess: 11 mmol/L — ABNORMAL HIGH (ref 0.0–2.0)
Bicarbonate: 39.5 mmol/L — ABNORMAL HIGH (ref 20.0–28.0)
Calcium, Ion: 1.31 mmol/L (ref 1.15–1.40)
HCT: 27 % — ABNORMAL LOW (ref 39.0–52.0)
Hemoglobin: 9.2 g/dL — ABNORMAL LOW (ref 13.0–17.0)
O2 Saturation: 67 %
Potassium: 4.1 mmol/L (ref 3.5–5.1)
Sodium: 141 mmol/L (ref 135–145)
TCO2: 42 mmol/L — ABNORMAL HIGH (ref 22–32)
pCO2, Ven: 76.2 mmHg (ref 44–60)
pH, Ven: 7.322 (ref 7.25–7.43)
pO2, Ven: 39 mmHg (ref 32–45)

## 2024-08-30 LAB — BPAM RBC
Blood Product Expiration Date: 202602282359
ISSUE DATE / TIME: 202602041403
Unit Type and Rh: 6200

## 2024-08-30 LAB — MAGNESIUM: Magnesium: 1.9 mg/dL (ref 1.7–2.4)

## 2024-08-30 LAB — BASIC METABOLIC PANEL WITH GFR
Anion gap: 4 — ABNORMAL LOW (ref 5–15)
BUN: 26 mg/dL — ABNORMAL HIGH (ref 8–23)
CO2: 37 mmol/L — ABNORMAL HIGH (ref 22–32)
Calcium: 8.6 mg/dL — ABNORMAL LOW (ref 8.9–10.3)
Chloride: 101 mmol/L (ref 98–111)
Creatinine, Ser: 0.59 mg/dL — ABNORMAL LOW (ref 0.61–1.24)
GFR, Estimated: >60 mL/min
Glucose, Bld: 87 mg/dL (ref 70–99)
Potassium: 4.1 mmol/L (ref 3.5–5.1)
Sodium: 141 mmol/L (ref 135–145)

## 2024-08-30 LAB — TYPE AND SCREEN
ABO/RH(D): AB POS
Antibody Screen: NEGATIVE
Unit division: 0

## 2024-08-30 LAB — CULTURE, BLOOD (SINGLE): Culture: NO GROWTH

## 2024-08-30 LAB — PHOSPHORUS: Phosphorus: 2.2 mg/dL — ABNORMAL LOW (ref 2.5–4.6)

## 2024-08-30 LAB — GLUCOSE, CAPILLARY
Glucose-Capillary: 77 mg/dL (ref 70–99)
Glucose-Capillary: 81 mg/dL (ref 70–99)
Glucose-Capillary: 83 mg/dL (ref 70–99)
Glucose-Capillary: 87 mg/dL (ref 70–99)
Glucose-Capillary: 94 mg/dL (ref 70–99)
Glucose-Capillary: 98 mg/dL (ref 70–99)

## 2024-08-30 LAB — PROCALCITONIN: Procalcitonin: 0.1 ng/mL

## 2024-08-30 LAB — C-REACTIVE PROTEIN: CRP: 2.9 mg/dL — ABNORMAL HIGH

## 2024-08-30 MED ORDER — FENTANYL CITRATE (PF) 100 MCG/2ML IJ SOLN
INTRAMUSCULAR | Status: DC | PRN
  Administered 2024-08-30: 25 ug via INTRAVENOUS

## 2024-08-30 MED ORDER — ONDANSETRON HCL 4 MG/2ML IJ SOLN: 4.0000 mg | Freq: Four times a day (QID) | INTRAMUSCULAR | Status: AC | PRN

## 2024-08-30 MED ORDER — SODIUM CHLORIDE 0.9% FLUSH
3.0000 mL | Freq: Two times a day (BID) | INTRAVENOUS | Status: AC
  Administered 2024-08-30 – 2024-08-31 (×4): 3 mL via INTRAVENOUS

## 2024-08-30 MED ORDER — SODIUM CHLORIDE 0.9% FLUSH: 3.0000 mL | INTRAVENOUS | Status: DC | PRN

## 2024-08-30 MED ORDER — LIDOCAINE HCL (PF) 1 % IJ SOLN
INTRAMUSCULAR | Status: AC
  Filled 2024-08-30: qty 30

## 2024-08-30 MED ORDER — MIDAZOLAM HCL 2 MG/2ML IJ SOLN
INTRAMUSCULAR | Status: AC
  Filled 2024-08-30: qty 2

## 2024-08-30 MED ORDER — K PHOS MONO-SOD PHOS DI & MONO 155-852-130 MG PO TABS
500.0000 mg | ORAL_TABLET | Freq: Four times a day (QID) | ORAL | Status: AC
  Administered 2024-08-30 (×3): 500 mg via ORAL
  Filled 2024-08-30 (×2): qty 2

## 2024-08-30 MED ORDER — HYDRALAZINE HCL 20 MG/ML IJ SOLN: 10.0000 mg | INTRAMUSCULAR | Status: AC | PRN

## 2024-08-30 MED ORDER — MIDAZOLAM HCL (PF) 2 MG/2ML IJ SOLN
INTRAMUSCULAR | Status: DC | PRN
  Administered 2024-08-30: 1 mg via INTRAVENOUS

## 2024-08-30 MED ORDER — FENTANYL CITRATE (PF) 100 MCG/2ML IJ SOLN
INTRAMUSCULAR | Status: AC
  Filled 2024-08-30: qty 2

## 2024-08-30 MED ORDER — LIDOCAINE HCL (PF) 1 % IJ SOLN
INTRAMUSCULAR | Status: DC | PRN
  Administered 2024-08-30: 5 mL via INTRADERMAL

## 2024-08-30 MED ORDER — LABETALOL HCL 5 MG/ML IV SOLN: 10.0000 mg | INTRAVENOUS | Status: AC | PRN

## 2024-08-30 MED ORDER — SODIUM CHLORIDE 0.9 % IV SOLN: 250.0000 mL | INTRAVENOUS | Status: DC | PRN

## 2024-08-30 MED ORDER — ACETAMINOPHEN 325 MG PO TABS: 650.0000 mg | ORAL_TABLET | ORAL | Status: AC | PRN

## 2024-08-30 MED ORDER — SODIUM CHLORIDE 0.9 % IV SOLN: 250.0000 mL | INTRAVENOUS | Status: AC | PRN

## 2024-08-30 MED ORDER — SODIUM CHLORIDE 0.9% FLUSH: 3.0000 mL | INTRAVENOUS | Status: AC | PRN

## 2024-08-30 MED ORDER — HEPARIN (PORCINE) IN NACL 1000-0.9 UT/500ML-% IV SOLN
INTRAVENOUS | Status: DC | PRN
  Administered 2024-08-30: 500 mL

## 2024-08-30 MED ORDER — SODIUM CHLORIDE 0.9% FLUSH
3.0000 mL | Freq: Two times a day (BID) | INTRAVENOUS | Status: DC
  Administered 2024-08-30: 3 mL via INTRAVENOUS

## 2024-08-30 NOTE — Plan of Care (Signed)

## 2024-08-30 NOTE — Progress Notes (Signed)
" °   08/30/24 0020  BiPAP/CPAP/SIPAP  BiPAP/CPAP/SIPAP Pt Type Adult  BiPAP/CPAP/SIPAP Resmed  Mask Type Full face mask  IPAP 18 cmH20  EPAP 8 cmH2O  FiO2 (%) 28 %  Flow Rate 2 lpm  Patient Home Machine No  Patient Home Mask No  Patient Home Tubing No  Auto Titrate No  Device Plugged into RED Power Outlet Yes  BiPAP/CPAP /SiPAP Vitals  Resp 18  Bilateral Breath Sounds Rhonchi    "

## 2024-08-30 NOTE — Interval H&P Note (Signed)
 History and Physical Interval Note:  08/30/2024 9:32 AM  [name]  has presented today for surgery, with the diagnosis of HF.  The various methods of treatment have been discussed with the patient and family. After consideration of risks, benefits and other options for treatment, the patient has consented to  Procedures: RIGHT HEART CATH (N/A) as a surgical intervention.  The patient's history has been reviewed, patient examined, no change in status, stable for surgery.  I have reviewed the patient's chart and labs.  Questions were answered to the patient's satisfaction.     Jaline Pincock [employer]

## 2024-08-30 NOTE — TOC Progression Note (Signed)
 Transition of Care [facility]) - Progression Note    Patient Details  Name: [name] MRN: [id number] Date of Birth: Oct 25, 1950  [facility] CM/SW Contact  Landry DELENA Senters, RN Phone Number: 08/30/2024, 12:09 PM  Clinical Narrative:     CM met with patient to confirm plans for home at discharge. Patient is active with [facility] and reports his plan is to discharge home with [facility] to his daughter's home. Daughter is pharmacist at [employer].   CM will continue to follow.  Expected Discharge Plan:  (TBD) Barriers to Discharge: Continued Medical Work up               Expected Discharge Plan and Services       Living arrangements for the past 2 months: Single Family Home                                       Social Drivers of Health (SDOH) Interventions SDOH Screenings   Food Insecurity: No Food Insecurity (08/29/2024)  Housing: Unknown (08/29/2024)  Transportation Needs: No Transportation Needs (08/29/2024)  Utilities: Not At Risk (08/29/2024)  Alcohol Screen: Low Risk (06/05/2024)  Depression (PHQ2-9): Low Risk (12/28/2023)  Financial Resource Strain: Low Risk (06/05/2024)  Physical Activity: Inactive (06/05/2024)  Social Connections: Socially Isolated (08/29/2024)  Stress: No Stress Concern Present (06/05/2024)  Tobacco Use: Medium Risk (08/25/2024)  Health Literacy: Adequate Health Literacy (12/28/2023)    Readmission Risk Interventions    08/22/2024   12:31 PM 08/16/2024   11:30 AM  Readmission Risk Prevention Plan  Transportation Screening  Complete  PCP or Specialist Appt within 5-7 Days  Complete  Home Care Screening  Complete  Medication Review (RN CM)  Complete  HRI or Home Care Consult Complete   Social Work Consult for Recovery Care Planning/Counseling Complete   Palliative Care Screening Not Applicable   Medication Review Oceanographer) Complete

## 2024-08-30 NOTE — Progress Notes (Signed)
 OT Cancellation Note  Patient Details Name: Diamonte Stavely MRN: [id number] DOB: 06/08/1951   Cancelled Treatment:    Reason Eval/Treat Not Completed: Patient at procedure or test/ unavailable. Pt off unit at procedure, will follow up as able to.  Youa Deloney C, OT  Acute Rehabilitation Services Office [phone] Secure chat preferred   Adrianne GORMAN [employer] 08/30/2024, 9:50 AM

## 2024-08-30 NOTE — Progress Notes (Signed)
 "                                                                                                                                                                                                                                                                                PROGRESS NOTE     Patient Demographics:    Safir Michalec, is a 74 y.o. 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 Now comes again with similar presentation of dyspnea and hypotension. The patient is a poor historian and is bordering on respiratory extremis upon our arrival. He was immediately placed on NIV with improvement in his WOB, but with hypotension. He was intolerant to diuretics with worsening of his renal function. He is presently on Ancef  to complete 2/3 via a PICC placed 1/25.  Source of infection was felt to likely be from his chronic sacral wounds.  TEE was performed without vegetations.    He was under the care of PCCM, stabilized and transferred to progressive care on 08/28/2024 on day 3 of hospital stay.   Significant Hospital events:  1/19: Admit [facility], received 3.5 L IVF, 12.5 g albumin  x 2, started on midodrine  1/20: Transferred to ICU, PCCM consulted, discontinue midodrine  due to reflex bradycardia, started on norepinephrine , started on cefazolin  for positive blood culture with MSSA, staph epi.  Infectious disease consulted 1/21:  Norepinephrine  titrated off 1/22: Remains off of vasopressors, transfer to telemetry, PCCM signed off 1/23- TEE 1/25-volume overload-dosing Lasix  daily 1/25-PICC line placed in right upper extremity , the PICC  line should be removed at the end of therapy  1/31 readmitted for dyspnea and hypotension , AKI,?  His home NIV was not working     Subjective:   He denies any chest pain or shortness of breath this morning, but reports generalized weakness and fatigue.   Assessment  & Plan :   Acute on chronic hypoxic and hypercarbic respiratory failure Chronic HFpEF with RV dysfunction  Severe OSA -Some underlying diaphragm weakness and inclusion body myositis - Underlying OSA, on home NIV  -On home oxygen at baseline, requiring 8 L oxygen, currently weaned to 2 to 3 L . - Patient has been compliant with BiPAP during hospital stay , I have discussed with him to use BiPAP at bedtime when he sleeps and during naps at daytime . -Recent TEE 1/23 has shown normal LV function, no shunt, dilated RA RV suggestive of cor pulmonale, cardiology input greatly appreciated, concern for RV failure, plan for RHC today  - Was encouraged use incentive spirometer     Hypotension -  doubt this was related to sepsis, now improved.  Recent admission for MSSA bacteremia from sacral wounds - Improved.  On midodrine , likely due to combination of ongoing diuresis, underlying RV dysfunction and cor pulmonale, recent bacteremia, anemia of chronic disease causing hypovolemia. - TSH and cortisol within normal limit. - Given unit PRBC to assist with anemia and volume status as likely contributing to hypotension  History of MSSA bacteremia from sacral wound -Continue Ancef  as outlined earlier until 2/3 via PICC line, discontinue PICC line prior to discharge.  Anemia of chronic disease.  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   -History of the same is seen neurologist in [place] 2 years ago but no recent follow-up, postdischarge PCP to arrange for the same.  Continue PT OT, largely bedbound. - poor inspiratory effort, he was encouraged to use incentive spirometer  Has right arm PICC line finishing antibiotics on 08/28/2024.  Subsequently will be removed prior to discharge.       Condition - Extremely Guarded  Family Communication  :    None at bedside this morning  Code Status :  Full  Consults  :  PCCM, Renal, Cards  PUD Prophylaxis :     Procedures  :     TEE - 1. Left ventricular ejection fraction, by estimation, is 60 to 65%. The left ventricle has normal function. The left ventricle has no regional wall motion abnormalities.  2. Right ventricular systolic function is normal. The right ventricular size is mildly enlarged. Tricuspid regurgitation signal is inadequate for assessing PA pressure.  3. Left atrial size was mildly dilated. No left atrial/left atrial appendage thrombus was detected.  4. Right atrial size was moderately dilated.  5. The mitral valve is normal in structure. No evidence of mitral valve regurgitation. No evidence of mitral stenosis.  6. Tricuspid valve regurgitation is mild to moderate.  7. The aortic valve is tricuspid. There is mild calcification of the aortic valve. Aortic valve regurgitation is mild. Aortic valve sclerosis is present, with no evidence of aortic valve stenosis.  8. Aortic dilatation noted. There is mild dilatation of the aortic root, measuring 42 mm.  9. Agitated saline contrast bubble study was negative, with no evidence of any interatrial shunt.  TTE 1. Left ventricular ejection fraction, by estimation, is 70 to 75%. The left  ventricle has hyperdynamic function. The left ventricle has no regional wall motion abnormalities. Left ventricular diastolic parameters are indeterminate.  2. Right ventricular systolic function is moderately reduced. The right ventricular size is severely enlarged. Mildly increased right ventricular wall thickness. There is severely elevated pulmonary artery systolic pressure. The estimated right ventricular systolic pressure is 66.0 mmHg.  3. Left atrial size was mildly dilated.  4. Right atrial size was moderately dilated.  5. The mitral valve is grossly normal. No evidence of mitral valve regurgitation. No evidence of mitral stenosis.  6. Tricuspid valve regurgitation is mild to moderate.  7. The aortic valve is grossly normal. Aortic valve regurgitation is not visualized.  8. Aortic dilatation noted. There is mild dilatation of the ascending aorta, measuring 41 mm.  9. The inferior vena cava is dilated in size with <50% respiratory variability, suggesting right atrial pressure of 15 mmHg.  Renal ultrasound.  Nonacute      Disposition Plan  :    Status is: Inpatient   DVT Prophylaxis  :    Place TED hose Start: 08/29/24 0919 apixaban  (ELIQUIS ) tablet 2.5 mg Start: 08/28/24 1000 apixaban  (ELIQUIS ) tablet 2.5 mg     Lab Results  Component Value Date   PLT 109 (L) 08/30/2024    Diet :  Diet Order             Diet NPO time specified Except for: Sips with Meds  Diet effective midnight                    Inpatient Medications  Scheduled Meds:  apixaban   2.5 mg Oral BID   budesonide  (PULMICORT ) nebulizer solution  0.5 mg Nebulization BID   Chlorhexidine  Gluconate Cloth  6 each Topical Daily   ezetimibe   10 mg Oral Daily   feeding supplement  237 mL Oral BID BM   ferrous sulfate   325 mg Oral BID WC   folic acid   1 mg Oral Daily   Gerhardt's butt cream  1 Application Topical BID   hydrocerin   Topical Daily   midodrine   5 mg Oral TID WC   pantoprazole   40 mg Oral Daily    phosphorus  500 mg Oral QID   rosuvastatin   5 mg Oral Daily   sodium chloride  flush  3 mL Intravenous Q12H   tamsulosin   0.4 mg Oral QPC supper   thiamine   100 mg Oral Daily   torsemide   20 mg Oral Daily   Continuous Infusions:  sodium chloride       PRN Meds:.sodium chloride , acetaminophen , ALPRAZolam , Muscle Rub, mouth rinse, phenol, polyethylene glycol, senna, sodium chloride  flush, sodium chloride  flush    Objective:   Vitals:   08/30/24 0020 08/30/24 0400 08/30/24 0500 08/30/24 0724  BP:  (!) 113/42    Pulse:  71 71   Resp: 18 14 18    Temp:  97.7 F (36.5 C)  97.9 F (36.6 C)  TempSrc:  Oral  Oral  SpO2:  91% 96%   Weight:   120 kg   Height:        Wt Readings from Last 3 Encounters:  08/30/24 120 kg  08/22/24 120 kg  07/20/24 (!) 137.5 kg     Intake/Output Summary (Last 24 hours) at 08/30/2024 0728 Last data filed at 08/29/2024 1723  Gross per 24 hour  Intake 392.08 ml  Output --  Net 392.08 ml     Physical Exam  Awake Alert, No new F.N deficits, Normal affect [place].AT,PERRAL Supple Neck, No JVD,   Diminished air entry at the bases, but no respiratory distress or use of any accessory muscles RRR,No Gallops,Rubs or new Murmurs,  +ve B.Sounds, Abd Soft, No tenderness,   No Cyanosis, Clubbing, mild edema in upper extremity due to third spacing    RN pressure injury documentation: Wound 08/13/24 1840 Pressure Injury Coccyx Mid Stage 2 -  Partial thickness loss of dermis presenting as a shallow open injury with a red, pink wound bed without slough. (Active)     Wound 08/13/24 1840 Pressure Injury Buttocks Right;Left;Medial Deep Tissue Pressure Injury - Purple or maroon localized area of discolored intact skin or blood-filled blister due to damage of underlying soft tissue from pressure and/or shear. (Active)     Wound 08/13/24 1841 Pressure Injury Ischial tuberosity Right;Left Deep Tissue Pressure Injury - Purple or maroon localized area of discolored intact skin  or blood-filled blister due to damage of underlying soft tissue from pressure and/or shear. (Active)     Wound 08/25/24 0750 Pressure Injury Coccyx (Active)      Data Review:    Recent Labs  Lab 08/26/24 0455 08/27/24 0349 08/28/24 0312 08/29/24 0359 08/30/24 0309  WBC 7.7 6.2 5.8 4.4 3.9*  HGB 8.2* 8.4* 8.5* 7.8* 8.4*  HCT 25.1* 26.3* 26.5* 24.7* 26.3*  PLT 109* 122* 133* 122* 109*  MCV 107.7* 109.1* 108.2* 111.3* 108.2*  MCH 35.2* 34.9* 34.7* 35.1* 34.6*  MCHC 32.7 31.9 32.1 31.6 31.9  RDW 13.9 13.9 13.8 14.1 16.7*  LYMPHSABS  --   --   --   --  0.6*  MONOABS  --   --   --   --  0.6  EOSABS  --   --   --   --  0.2  BASOSABS  --   --   --   --  0.0    Recent Labs  Lab 08/25/24 0325 08/25/24 0344 08/25/24 1200 08/26/24 0455 08/27/24 0349 08/28/24 0312 08/29/24 0359 08/30/24 0309  NA 139 138   < > 141 140 139 140 141  K 4.6 4.2   < > 3.5 3.8 3.7 3.9 4.1  CL 94*  --    < > 100 99 99 100 101  CO2 29  --    < > 33* 33* 34* 37* 37*  ANIONGAP 15  --    < > 8 9 6  4* 4*  GLUCOSE 75  --    < > 96 89 99 84 87  BUN 34*  --    < > 32* 25* 31* 31* 26*  CREATININE 2.81*  --    < > 1.76* 1.06 1.17 0.93 0.59*  CRP  --   --   --   --   --  4.8*  --  2.9*  PROCALCITON  --   --   --   --   --  0.13  --  0.10  LATICACIDVEN  --  1.5  --   --   --   --   --   --   INR 1.9*  --   --   --   --   --   --   --   TSH  --   --   --   --   --   --  1.150  --   MG  --   --   --  1.9 2.0 2.1 1.9 1.9  PHOS  --   --   --  3.3 2.2* 3.0 2.9 2.2*  CALCIUM  8.6*  --    < > 8.2* 8.5* 8.6* 8.7* 8.6*   < > = values in this interval not displayed.      Recent Labs  Lab 08/25/24 0325 08/25/24 0344 08/25/24 1200 08/26/24 0455 08/27/24 0349 08/28/24 0312 08/29/24 0359 08/30/24 0309  CRP  --   --   --   --   --  4.8*  --  2.9*  PROCALCITON  --   --   --   --   --  0.13  --  0.10  LATICACIDVEN  --  1.5  --   --   --   --   --   --   INR 1.9*  --   --   --   --   --   --   --   TSH  --   --    --   --   --   --  1.150  --   MG  --   --   --  1.9 2.0 2.1 1.9 1.9  CALCIUM  8.6*  --    < > 8.2* 8.5* 8.6* 8.7* 8.6*   < > = values in this interval not displayed.    --------------------------------------------------------------------------------------------------------------- Lab Results  Component Value Date   CHOL 99 12/12/2023   HDL 53.40 12/12/2023   LDLCALC 36 12/12/2023   LDLDIRECT 65 05/16/2019   TRIG 45.0 12/12/2023   CHOLHDL 2 12/12/2023    Lab Results  Component Value Date   HGBA1C 5.3 05/05/2023   Recent Labs    08/29/24 0359  TSH 1.150  FREET4 1.33   Recent Labs    08/28/24 0657 08/29/24 0359  VITAMINB12 1,333*  --   FOLATE 17.6  --   FERRITIN 388*  --   TIBC 193*  --   IRON 44*  --   RETICCTPCT  --  3.1   ------------------------------------------------------------------------------------------------------------------ Cardiac Enzymes No results for input(s): CKMB, TROPONINI, MYOGLOBIN in the last 168 hours.  Invalid input(s): CK ProBNP    Component Value Date/Time   PROBNP 1,027.0 (H) 08/28/2024 0312   PROBNP 549 (H) 04/10/2024 1338     Micro Results Recent Results (from the past 240 hours)  Culture, blood (single)     Status: None   Collection Time: 08/25/24  5:20 AM   Specimen: BLOOD LEFT ARM  Result Value Ref Range Status   Specimen Description BLOOD LEFT ARM  Final   Special Requests   Final    BOTTLES DRAWN AEROBIC AND ANAEROBIC Blood Culture results may not be optimal due to an inadequate volume of blood received in culture bottles   Culture   Final    NO GROWTH 5 DAYS Performed at [facility], [address], [place], [place] [address]    Report Status 08/30/2024 FINAL  Final  MRSA Next Gen by PCR, Nasal     Status: None   Collection Time: 08/25/24  6:07 AM   Specimen: Nasal Mucosa; Nasal Swab  Result Value Ref Range Status   MRSA by PCR Next Gen NOT DETECTED NOT DETECTED Final    Comment: (NOTE) The  GeneXpert MRSA Assay (FDA approved for NASAL specimens only), is one component of a comprehensive MRSA colonization surveillance program. It is not intended  to diagnose MRSA infection nor to guide or monitor treatment for MRSA infections. Test performance is not FDA approved in patients less than 44 years old. Performed at [facility], [address] [place], [place] [address]     Radiology Report DG Chest Port 1 View Result Date: 08/28/2024 EXAM: 1 VIEW(S) XRAY OF THE CHEST 08/28/2024 06:38:00 AM COMPARISON: Portable chest 08/25/2024. CLINICAL HISTORY: SOB (shortness of breath) [id number] shortness of breath. FINDINGS: LINES, TUBES AND DEVICES: Telemetry leads overlie the chest. Right PICC again noted with the tip at the brachiocephalic/SVC junction. LUNGS AND PLEURA: Chronic elevated right hemidiaphragm with colonic interposition. Patchy airspace disease continues in the lower lung fields, not well seen on the right due to the elevated hemidiaphragm. There are small pleural effusions. No pneumothorax. HEART AND MEDIASTINUM: Stable cardiomegaly and mild central vascular prominence. Stable mediastinum with aortic atherosclerosis. BONES AND SOFT TISSUES: Moderate thoracic spondylosis. No acute osseous abnormality. IMPRESSION: 1. Patchy airspace disease in the lower lung fields, not well seen on the right due to the elevated hemidiaphragm. Overall aeration seems unchanged. 2. Small pleural effusions. 3. Stable cardiomegaly and mild central vascular prominence. Electronically signed by: Francis Quam MD 08/28/2024 07:12 AM EST RP Workstation: [id number]   US  RENAL Result Date: 08/26/2024 EXAM: RETROPERITONEAL ULTRASOUND OF THE KIDNEYS 08/26/2024 09:58:44 PM TECHNIQUE: Real-time ultrasonography of the retroperitoneum, specifically the kidneys and urinary bladder, was performed. Evaluation of the kidneys is limited by body habitus. Patient unable to hold breath. COMPARISON: US  Renal 07/09/2024 and CT  07/10/2024. CLINICAL HISTORY: Acute kidney injury. FINDINGS: RIGHT KIDNEY: Right kidney measures 11.0 cm in length. Normal cortical echogenicity. No hydronephrosis. No calculus. No mass. Right ureteral jet is not visualized. LEFT KIDNEY: Left kidney measures 12.4 cm in length. Normal cortical echogenicity. No hydronephrosis. No calculus. No mass. Left ureteral jet is not visualized. BLADDER: Unremarkable appearance of the bladder. IMPRESSION: 1. No acute renal abnormality. Electronically signed by: Norman Gatlin MD 08/26/2024 10:08 PM EST RP Workstation: [id number]   DG Chest Port 1 View Result Date: 08/25/2024 EXAM: 1 VIEW XRAY OF THE CHEST 08/25/2024 03:10:00 AM COMPARISON: 08/20/2024 CLINICAL HISTORY: Shortness of breath. FINDINGS: LINES, TUBES AND DEVICES: Stable right PICC line. LUNGS AND PLEURA: Low lung volumes with elevated right hemidiaphragm with superimposed linear and veiling opacity at the right lung base. Small pleural effusion. No pneumothorax or pulmonary edema. HEART AND MEDIASTINUM: No acute abnormality of the cardiac and mediastinal silhouettes. BONES AND SOFT TISSUES: No acute osseous abnormality. Bowel gas below the diaphragm. IMPRESSION: 1. Right basilar atelectasis in the setting of low lung volumes and an elevated right hemidiaphragm. 2. Small right pleural effusion. No pneumothorax or pulmonary edema. Electronically signed by: Waddell Calk MD 08/25/2024 06:44 AM EST RP Workstation: [id number]   DG CHEST PORT 1 VIEW Result Date: 08/20/2024 CLINICAL DATA:  Hypoxia. EXAM: PORTABLE CHEST 1 VIEW COMPARISON:  08/18/2024 and CT chest 07/12/2024. FINDINGS: Trachea is midline. Heart is enlarged. Thoracic aorta is calcified. Lungs are low in volume with bibasilar airspace opacification, slightly improved from 08/18/2024. Bibasilar atelectasis. Difficult to exclude small bilateral pleural effusions. Elevated right hemidiaphragm. IMPRESSION: 1. Low lung volumes with an elevated right  hemidiaphragm. Associated bibasilar atelectasis, improved from 08/18/2024. 2. Difficult to exclude small bilateral pleural effusions. Electronically Signed   By: Newell Eke M.D.   On: 08/20/2024 14:11   US  EKG SITE RITE Result Date: 08/19/2024 If Site Rite image not attached, placement could not be confirmed due to current cardiac rhythm.  DG  CHEST PORT 1 VIEW Result Date: 08/18/2024 CLINICAL DATA:  Aspiration into airway. EXAM: PORTABLE CHEST 1 VIEW COMPARISON:  08/14/2024, 07/12/2024. FINDINGS: The heart is enlarged and the mediastinal contour stable. There is atherosclerotic calcification of the aorta. Lung volumes are low. Interstitial prominence is noted bilaterally with airspace disease at the lung bases. There are likely small bilateral pleural effusions. No pneumothorax is seen. Cervical spinal fusion hardware is noted. IMPRESSION: 1. Increased interstitial prominence with airspace disease at the lung bases, possible edema or infiltrate. 2. Suspected small bilateral pleural effusions. Electronically Signed   By: Leita Birmingham M.D.   On: 08/18/2024 16:47   ECHO TEE Result Date: 08/17/2024    TRANSESOPHOGEAL ECHO REPORT   Patient Name:   JUMAR GREENSTREET Date of Exam: 08/17/2024 Medical Rec #:  [id number]   Height:       71.0 in Accession #:    [id number]  Weight:       272.3 lb Date of Birth:  August 04, 1950   BSA:          2.404 m Patient Age:    74 years    BP:           140/89 mmHg Patient Gender: M           HR:           93 bpm. Exam Location:  Inpatient Procedure: Transesophageal Echo, Cardiac Doppler, Color Doppler and Saline            Contrast Bubble Study (Both Spectral and Color Flow Doppler were            utilized during procedure). Indications:    Bacteremia  History:        Patient has prior history of Echocardiogram examinations.  Sonographer:    Jayson Gaskins Referring Phys: [id number] ZANE ADAMS PROCEDURE: The transesophogeal probe was passed without difficulty through the esophogus of the  patient. Sedation performed by different physician. The patient developed no complications during the procedure.  IMPRESSIONS  1. Left ventricular ejection fraction, by estimation, is 60 to 65%. The left ventricle has normal function. The left ventricle has no regional wall motion abnormalities.  2. Right ventricular systolic function is normal. The right ventricular size is mildly enlarged. Tricuspid regurgitation signal is inadequate for assessing PA pressure.  3. Left atrial size was mildly dilated. No left atrial/left atrial appendage thrombus was detected.  4. Right atrial size was moderately dilated.  5. The mitral valve is normal in structure. No evidence of mitral valve regurgitation. No evidence of mitral stenosis.  6. Tricuspid valve regurgitation is mild to moderate.  7. The aortic valve is tricuspid. There is mild calcification of the aortic valve. Aortic valve regurgitation is mild. Aortic valve sclerosis is present, with no evidence of aortic valve stenosis.  8. Aortic dilatation noted. There is mild dilatation of the aortic root, measuring 42 mm.  9. Agitated saline contrast bubble study was negative, with no evidence of any interatrial shunt. Conclusion(s)/Recommendation(s): No evidence of vegetation/infective endocarditis on this transesophageael echocardiogram. FINDINGS  Left Ventricle: Left ventricular ejection fraction, by estimation, is 60 to 65%. The left ventricle has normal function. The left ventricle has no regional wall motion abnormalities. The left ventricular internal cavity size was normal in size. There is  no left ventricular hypertrophy. Right Ventricle: The right ventricular size is mildly enlarged. No increase in right ventricular wall thickness. Right ventricular systolic function is normal. Tricuspid regurgitation signal is inadequate for assessing PA pressure. Left  Atrium: Left atrial size was mildly dilated. No left atrial/left atrial appendage thrombus was detected. Right  Atrium: Right atrial size was moderately dilated. Pericardium: There is no evidence of pericardial effusion. Mitral Valve: The mitral valve is normal in structure. No evidence of mitral valve regurgitation. No evidence of mitral valve stenosis. Tricuspid Valve: The tricuspid valve is normal in structure. Tricuspid valve regurgitation is mild to moderate. Aortic Valve: The aortic valve is tricuspid. There is mild calcification of the aortic valve. Aortic valve regurgitation is mild. Aortic valve sclerosis is present, with no evidence of aortic valve stenosis. Pulmonic Valve: The pulmonic valve was grossly normal. Pulmonic valve regurgitation is mild to moderate. No evidence of pulmonic stenosis. Aorta: Aortic dilatation noted. There is mild dilatation of the aortic root, measuring 42 mm. IAS/Shunts: There is left bowing of the interatrial septum, suggestive of elevated right atrial pressure. There is redundancy of the interatrial septum. No atrial level shunt detected by color flow Doppler. Agitated saline contrast was given intravenously to evaluate for intracardiac shunting. Agitated saline contrast bubble study was negative, with no evidence of any interatrial shunt.   AORTA Ao Root diam: 4.23 cm Ao Asc diam:  3.24 cm Jerel Balding MD Electronically signed by Jerel Balding MD Signature Date/Time: 08/17/2024/2:49:15 PM    Final    EP STUDY Result Date: 08/17/2024 See surgical note for result.  ECHOCARDIOGRAM COMPLETE Result Date: 08/14/2024    ECHOCARDIOGRAM REPORT   Patient Name:   ZEBULAN HINSHAW Date of Exam: 08/14/2024 Medical Rec #:  [id number]   Height:       71.0 in Accession #:    [id number]  Weight:       272.3 lb Date of Birth:  1951/03/01   BSA:          2.404 m Patient Age:    74 years    BP:           92/33 mmHg Patient Gender: M           HR:           70 bpm. Exam Location:  Inpatient Procedure: 2D Echo, Cardiac Doppler, Color Doppler and Intracardiac            Opacification Agent (Both Spectral  and Color Flow Doppler were            utilized during procedure). Indications:    Shock  History:        Patient has prior history of Echocardiogram examinations, most                 recent 07/10/2024. CHF, CAD, Abnormal ECG, Arrythmias:Atrial                 Fibrillation, Signs/Symptoms:Bacteremia; Risk                 Factors:Hypertension.  Sonographer:    Ellouise Mose RDCS Referring Phys: [id number] ERIC J AUSTRIA  Sonographer Comments: Technically difficult study due to poor echo windows, suboptimal parasternal window, suboptimal apical window and suboptimal subcostal window. Study was originally ordered as limited, then MD changed to complete with definity . IMPRESSIONS  1. Left ventricular ejection fraction, by estimation, is 70 to 75%. The left ventricle has hyperdynamic function. The left ventricle has no regional wall motion abnormalities. Left ventricular diastolic parameters are indeterminate.  2. Right ventricular systolic function is moderately reduced. The right ventricular size is severely enlarged. Mildly increased right ventricular wall thickness. There is severely elevated pulmonary artery systolic pressure.  The estimated right ventricular systolic pressure is 66.0 mmHg.  3. Left atrial size was mildly dilated.  4. Right atrial size was moderately dilated.  5. The mitral valve is grossly normal. No evidence of mitral valve regurgitation. No evidence of mitral stenosis.  6. Tricuspid valve regurgitation is mild to moderate.  7. The aortic valve is grossly normal. Aortic valve regurgitation is not visualized.  8. Aortic dilatation noted. There is mild dilatation of the ascending aorta, measuring 41 mm.  9. The inferior vena cava is dilated in size with <50% respiratory variability, suggesting right atrial pressure of 15 mmHg. Comparison(s): Prior images reviewed side by side. Changes from prior study are noted. Right ventricular size has increased and right ventricular function has decreased. FINDINGS   Left Ventricle: Left ventricular ejection fraction, by estimation, is 70 to 75%. The left ventricle has hyperdynamic function. The left ventricle has no regional wall motion abnormalities. Definity  contrast agent was given IV to delineate the left ventricular endocardial borders. The left ventricular internal cavity size was normal in size. There is borderline concentric left ventricular hypertrophy. Left ventricular diastolic function could not be evaluated due to atrial fibrillation. Left ventricular diastolic parameters are indeterminate. Right Ventricle: The right ventricular size is severely enlarged. Mildly increased right ventricular wall thickness. Right ventricular systolic function is moderately reduced. There is severely elevated pulmonary artery systolic pressure. The tricuspid regurgitant velocity is 3.57 m/s, and with an assumed right atrial pressure of 15 mmHg, the estimated right ventricular systolic pressure is 66.0 mmHg. Left Atrium: Left atrial size was mildly dilated. Right Atrium: Right atrial size was moderately dilated. Pericardium: There is no evidence of pericardial effusion. Mitral Valve: The mitral valve is grossly normal. Mild mitral annular calcification. No evidence of mitral valve regurgitation. No evidence of mitral valve stenosis. Tricuspid Valve: The tricuspid valve is grossly normal. Tricuspid valve regurgitation is mild to moderate. No evidence of tricuspid stenosis. Aortic Valve: The aortic valve is grossly normal. Aortic valve regurgitation is not visualized. Pulmonic Valve: The pulmonic valve was grossly normal. Pulmonic valve regurgitation is mild. Aorta: Aortic dilatation noted. There is mild dilatation of the ascending aorta, measuring 41 mm. Venous: The inferior vena cava is dilated in size with less than 50% respiratory variability, suggesting right atrial pressure of 15 mmHg. IAS/Shunts: The interatrial septum was not well visualized.  LEFT VENTRICLE PLAX 2D LVIDd:          4.00 cm LVIDs:         2.70 cm LV PW:         1.00 cm LV IVS:        1.20 cm  LV Volumes (MOD) LV vol d, MOD A2C: 109.7 ml LV vol d, MOD A4C: 112.0 ml LV vol s, MOD A2C: 25.5 ml LV vol s, MOD A4C: 41.3 ml LV SV MOD A2C:     84.3 ml LV SV MOD A4C:     112.0 ml LV SV MOD BP:      78.9 ml IVC IVC diam: 2.45 cm RIGHT ATRIUM           Index RA Area:     25.10 cm RA Volume:   80.90 ml  33.65 ml/m  AORTIC VALVE LVOT Vmax:   89.70 cm/s LVOT Vmean:  56.000 cm/s LVOT VTI:    0.194 m  AORTA Ao Asc diam: 4.03 cm MITRAL VALVE                TRICUSPID VALVE MV Area (  PHT): 3.65 cm     TR Peak grad:   51.0 mmHg MV Decel Time: 208 msec     TR Vmax:        357.00 cm/s MV E velocity: 102.00 cm/s                             SHUNTS                             Systemic VTI: 0.19 m Jerel Balding MD Electronically signed by Jerel Balding MD Signature Date/Time: 08/14/2024/7:26:01 PM    Final    VAS US  ABI WITH/WO TBI Result Date: 08/14/2024  LOWER EXTREMITY DOPPLER STUDY Patient Name:  Miroslav Gin  Date of Exam:   08/14/2024 Medical Rec #: [id number]    Accession #:    [id number] Date of Birth: 04-10-1951    Patient Gender: M Patient Age:   54 years Exam Location:  [facility] Procedure:      VAS US  ABI WITH/WO TBI Referring Phys: ERIC AUSTRIA --------------------------------------------------------------------------------  Indications: Peripheral artery disease. High Risk Factors: Hypertension, hyperlipidemia, past history of smoking,                    coronary artery disease.  Vascular Interventions: CHF, Lymphedema, Hx of DVT and venous stasis disease. Limitations: Today's exam was limited due to continuous patient movement,              tough/thickened skin. Comparison Study: Previous exam 03/04/2022 Performing Technologist: Leigh Rom RVT, RDMS  Examination Guidelines: A complete evaluation includes at minimum, Doppler waveform signals and systolic blood pressure reading at the level of bilateral brachial, anterior  tibial, and posterior tibial arteries, when vessel segments are accessible. Bilateral testing is considered an integral part of a complete examination. Photoelectric Plethysmograph (PPG) waveforms and toe systolic pressure readings are included as required and additional duplex testing as needed. Limited examinations for reoccurring indications may be performed as noted.  ABI Findings: +---------+------------------+-----+----------+----------------+ Right    Rt Pressure (mmHg)IndexWaveform  Comment          +---------+------------------+-----+----------+----------------+ Brachial 105                    triphasic                  +---------+------------------+-----+----------+----------------+ PTA                             monophasicnon compressible +---------+------------------+-----+----------+----------------+ DP                              monophasicnon compressible +---------+------------------+-----+----------+----------------+ Great Toe26                0.23 Abnormal                   +---------+------------------+-----+----------+----------------+ +---------+------------------+-----+----------+----------------+ Left     Lt Pressure (mmHg)IndexWaveform  Comment          +---------+------------------+-----+----------+----------------+ Brachial 112                    triphasic                  +---------+------------------+-----+----------+----------------+ PTA      127  1.13 monophasic                 +---------+------------------+-----+----------+----------------+ DP                              monophasicnon compressible +---------+------------------+-----+----------+----------------+ Great Toe20                0.18 Abnormal                   +---------+------------------+-----+----------+----------------+ +-------+-----------+-----------+------------+------------+ ABI/TBIToday's ABIToday's TBIPrevious ABIPrevious TBI  +-------+-----------+-----------+------------+------------+ Right  [place]         0.23       [place]          0.75         +-------+-----------+-----------+------------+------------+ Left   [place]         0.18       [place]          0.57         +-------+-----------+-----------+------------+------------+ Arterial wall calcification precludes accurate ankle pressures and ABIs. Bilateral ABIs appear essentially unchanged. Bilateral TBIs appear decreased.  Summary: Right: Resting right ankle-brachial index indicates noncompressible right lower extremity arteries. The right toe-brachial index is abnormal.  Left: Resting left ankle-brachial index indicates noncompressible left lower extremity arteries. The left toe-brachial index is abnormal.  *See table(s) above for measurements and observations.  Electronically signed by Gaile New MD on 08/14/2024 at 5:42:38 PM.    Final    DG CHEST PORT 1 VIEW Result Date: 08/14/2024 CLINICAL DATA:  Shortness of breath. EXAM: PORTABLE CHEST 1 VIEW COMPARISON:  August 13, 2024 FINDINGS: Stable cardiomegaly. Elevated right hemidiaphragm is noted. Minimal bibasilar subsegmental atelectasis is noted with small pleural effusions. IMPRESSION: Minimal bibasilar subsegmental atelectasis with small pleural effusions. Electronically Signed   By: Lynwood Landy Raddle M.D.   On: 08/14/2024 10:48   DG Chest 1 View Result Date: 08/13/2024 CLINICAL DATA:  Shortness of breath. EXAM: CHEST  1 VIEW COMPARISON:  07/17/2024. FINDINGS: Persistent low lung volumes with chronic elevation of the right hemidiaphragm and increased right basilar atelectasis. Left lung appears clear. No pneumothorax. Stable cardiomegaly. No acute osseous abnormality. IMPRESSION: Persistent low lung volumes with chronic elevation of the right hemidiaphragm and increased right basilar atelectasis. Electronically Signed   By: Harrietta Sherry M.D.   On: 08/13/2024 13:54     Signature  -   Brayton Jahzier Villalon M.D on 08/30/2024 at 7:28  AM   -  To page go to [email or web address]   "

## 2024-08-31 ENCOUNTER — Encounter (HOSPITAL_BASED_OUTPATIENT_CLINIC_OR_DEPARTMENT_OTHER): Admitting: General Surgery

## 2024-08-31 ENCOUNTER — Encounter (HOSPITAL_COMMUNITY): Payer: Self-pay | Admitting: Cardiology

## 2024-08-31 LAB — POCT I-STAT EG7
Acid-Base Excess: 11 mmol/L — ABNORMAL HIGH (ref 0.0–2.0)
Bicarbonate: 38.5 mmol/L — ABNORMAL HIGH (ref 20.0–28.0)
Calcium, Ion: 1.3 mmol/L (ref 1.15–1.40)
HCT: 27 % — ABNORMAL LOW (ref 39.0–52.0)
Hemoglobin: 9.2 g/dL — ABNORMAL LOW (ref 13.0–17.0)
O2 Saturation: 67 %
Potassium: 4.1 mmol/L (ref 3.5–5.1)
Sodium: 141 mmol/L (ref 135–145)
TCO2: 41 mmol/L — ABNORMAL HIGH (ref 22–32)
pCO2, Ven: 73 mmHg (ref 44–60)
pH, Ven: 7.33 (ref 7.25–7.43)
pO2, Ven: 39 mmHg (ref 32–45)

## 2024-08-31 LAB — GLUCOSE, CAPILLARY
Glucose-Capillary: 106 mg/dL — ABNORMAL HIGH (ref 70–99)
Glucose-Capillary: 153 mg/dL — ABNORMAL HIGH (ref 70–99)
Glucose-Capillary: 68 mg/dL — ABNORMAL LOW (ref 70–99)
Glucose-Capillary: 71 mg/dL (ref 70–99)
Glucose-Capillary: 79 mg/dL (ref 70–99)
Glucose-Capillary: 81 mg/dL (ref 70–99)

## 2024-08-31 LAB — CBC
HCT: 28.2 % — ABNORMAL LOW (ref 39.0–52.0)
Hemoglobin: 9 g/dL — ABNORMAL LOW (ref 13.0–17.0)
MCH: 34.4 pg — ABNORMAL HIGH (ref 26.0–34.0)
MCHC: 31.9 g/dL (ref 30.0–36.0)
MCV: 107.6 fL — ABNORMAL HIGH (ref 80.0–100.0)
Platelets: 103 10*3/uL — ABNORMAL LOW (ref 150–400)
RBC: 2.62 MIL/uL — ABNORMAL LOW (ref 4.22–5.81)
RDW: 15.4 % (ref 11.5–15.5)
WBC: 4.7 10*3/uL (ref 4.0–10.5)
nRBC: 0 % (ref 0.0–0.2)

## 2024-08-31 LAB — BASIC METABOLIC PANEL WITH GFR
Anion gap: 6 (ref 5–15)
BUN: 18 mg/dL (ref 8–23)
CO2: 35 mmol/L — ABNORMAL HIGH (ref 22–32)
Calcium: 8.5 mg/dL — ABNORMAL LOW (ref 8.9–10.3)
Chloride: 101 mmol/L (ref 98–111)
Creatinine, Ser: 0.53 mg/dL — ABNORMAL LOW (ref 0.61–1.24)
GFR, Estimated: >60 mL/min
Glucose, Bld: 67 mg/dL — ABNORMAL LOW (ref 70–99)
Potassium: 3.8 mmol/L (ref 3.5–5.1)
Sodium: 142 mmol/L (ref 135–145)

## 2024-08-31 MED ORDER — MELATONIN 5 MG PO TABS
5.0000 mg | ORAL_TABLET | Freq: Every evening | ORAL | Status: AC | PRN
  Filled 2024-08-31: qty 1

## 2024-08-31 NOTE — Plan of Care (Signed)

## 2024-08-31 NOTE — Progress Notes (Signed)
 "                                                                                                                                                                                                                                                                                PROGRESS NOTE     Patient Demographics:    Anvith Mauriello, is a 74 y.o. 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 Now comes again with similar presentation of dyspnea and hypotension. The patient is a poor historian and is bordering on respiratory extremis upon our arrival. He was immediately placed on NIV with improvement in his WOB, but with hypotension. He was intolerant to diuretics with worsening of his renal function. He is presently on Ancef  to complete 2/3 via a PICC placed 1/25.  Source of infection was felt to likely be from his chronic sacral wounds.  TEE was performed without vegetations.    He was under the care of PCCM, stabilized and transferred to progressive care on 08/28/2024 on day 3 of hospital stay.   Significant Hospital events:  1/19: Admit TRH, received 3.5 L IVF, 12.5 g albumin  x 2, started on midodrine  1/20: Transferred to ICU, PCCM consulted, discontinue midodrine  due to reflex bradycardia, started on norepinephrine , started on cefazolin  for positive blood culture with MSSA, staph epi.  Infectious disease consulted 1/21:  Norepinephrine  titrated off 1/22: Remains off of vasopressors, transfer to telemetry, PCCM signed off 1/23- TEE 1/25-volume overload-dosing Lasix  daily 1/25-PICC line placed in right upper extremity , the PICC  line should be removed at the end of therapy  1/31 readmitted for dyspnea and hypotension , AKI,?  His home NIV was not working 2/5 RHC 08/2024: Low filling pressures, mild pulmonary hypertension, preserved cardiac output Likely not candidate for selective pulmonary vasodilators, appeared well diuresed on RHC    Subjective:   No chest pain, afebrile, reports some dyspnea at baseline.   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 -Recent TEE 1/23 has shown normal LV function, no shunt, dilated RA RV suggestive of cor pulmonale, cardiology input greatly appreciated, went for RHC 11/24/24, with low filling pressure, so torsemide  has been discontinued, mild pulmonary hypertension with a preserved cardiac output, likely not a candidate for selective pulmonary vasodilators, will arrange for home oxygen on discharge - Was encouraged use incentive spirometer      Hypotension -  doubt this was related to sepsis, now improved.  Recent admission for MSSA bacteremia from sacral wounds - Improved.  On midodrine , likely due to combination of ongoing diuresis, underlying RV dysfunction and cor pulmonale, recent bacteremia, anemia of chronic disease causing hypovolemia. - TSH and cortisol within normal limit. - Given unit PRBC to assist with  anemia and volume status as likely contributing to hypotension - Will hold diuresis as appears to be dry on RHC  History of MSSA bacteremia from sacral wound -Continue Ancef  as outlined earlier until 2/3 via PICC line, discontinue PICC line prior to discharge.  Anemia of chronic disease.  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   -History of the same is seen neurologist in [place] 2 years ago but no recent follow-up, postdischarge PCP to arrange for the same.  Continue PT OT, largely bedbound. - poor inspiratory effort, he was encouraged to use incentive spirometer  Has right arm PICC line finishing antibiotics on 08/28/2024.  Subsequently will be removed prior to discharge.       Condition - Extremely Guarded  Family Communication  : unable to reach son-in-law or daughter by phone, left voice messages, will try again later  None at bedside this morning  Code Status :  Full  Consults  :  PCCM, Renal, Cards  PUD Prophylaxis :     Procedures  :     TEE - 1. Left ventricular ejection fraction, by estimation, is 60 to 65%. The left ventricle has normal function. The left ventricle has no regional wall motion abnormalities.  2. Right ventricular systolic function is normal. The right ventricular size is mildly enlarged. Tricuspid regurgitation signal is inadequate for assessing PA pressure.  3. Left atrial size was mildly dilated. No left atrial/left atrial appendage thrombus was detected.  4. Right atrial size was moderately dilated.  5. The  mitral valve is normal in structure. No evidence of mitral valve regurgitation. No evidence of mitral stenosis.  6. Tricuspid valve  regurgitation is mild to moderate.  7. The aortic valve is tricuspid. There is mild calcification of the aortic valve. Aortic valve regurgitation is mild. Aortic valve sclerosis is present, with no evidence of aortic valve stenosis.  8. Aortic dilatation noted. There is mild dilatation of the aortic root, measuring 42 mm.  9. Agitated saline contrast bubble study was negative, with no evidence of any interatrial shunt.  TTE 1. Left ventricular ejection fraction, by estimation, is 70 to 75%. The left ventricle has hyperdynamic function. The left ventricle has no regional wall motion abnormalities. Left ventricular diastolic parameters are indeterminate.  2. Right ventricular systolic function is moderately reduced. The right ventricular size is severely enlarged. Mildly increased right ventricular wall thickness. There is severely elevated pulmonary artery systolic pressure. The estimated right ventricular systolic pressure is 66.0 mmHg.  3. Left atrial size was mildly dilated.  4. Right atrial size was moderately dilated.  5. The mitral valve is grossly normal. No evidence of mitral valve regurgitation. No evidence of mitral stenosis.  6. Tricuspid valve regurgitation is mild to moderate.  7. The aortic valve is grossly normal. Aortic valve regurgitation is not visualized.  8. Aortic dilatation noted. There is mild dilatation of the ascending aorta, measuring 41 mm.  9. The inferior vena cava is dilated in size with <50% respiratory variability, suggesting right atrial pressure of 15 mmHg.  Renal ultrasound.  Nonacute      Disposition Plan  :    Status is: Inpatient   DVT Prophylaxis  :    SCD's Start: 08/30/24 1051 Place TED hose Start: 08/29/24 0919 apixaban  (ELIQUIS ) tablet 2.5 mg Start: 08/28/24 1000 apixaban  (ELIQUIS ) tablet 2.5 mg     Lab Results  Component Value Date   PLT 109 (L) 08/30/2024    Diet :  Diet Order             Diet 2 gram sodium Room service appropriate? Yes; Fluid  consistency: Thin  Diet effective now                    Inpatient Medications  Scheduled Meds:  apixaban   2.5 mg Oral BID   budesonide  (PULMICORT ) nebulizer solution  0.5 mg Nebulization BID   Chlorhexidine  Gluconate Cloth  6 each Topical Daily   ezetimibe   10 mg Oral Daily   feeding supplement  237 mL Oral BID BM   ferrous sulfate   325 mg Oral BID WC   folic acid   1 mg Oral Daily   Gerhardt's butt cream  1 Application Topical BID   hydrocerin   Topical Daily   midodrine   5 mg Oral TID WC   pantoprazole   40 mg Oral Daily   rosuvastatin   5 mg Oral Daily   sodium chloride  flush  3 mL Intravenous Q12H   tamsulosin   0.4 mg Oral QPC supper   thiamine   100 mg Oral Daily   Continuous Infusions:    PRN Meds:.acetaminophen , acetaminophen , ALPRAZolam , Muscle Rub, ondansetron  (ZOFRAN ) IV, mouth rinse, phenol, polyethylene glycol, senna, sodium chloride  flush, sodium chloride  flush    Objective:   Vitals:   08/31/24 0000 08/31/24 0400 08/31/24 0422 08/31/24 0812  BP: (!) 142/65 129/61    Pulse:      Resp: (!) 23 16    Temp: 98.1 F (36.7 C) 98.3 F (36.8 C)    TempSrc:  SpO2:    94%  Weight:   121.7 kg   Height:        Wt Readings from Last 3 Encounters:  08/31/24 121.7 kg  08/22/24 120 kg  07/20/24 (!) 137.5 kg     Intake/Output Summary (Last 24 hours) at 08/31/2024 1052 Last data filed at 08/30/2024 2235 Gross per 24 hour  Intake --  Output 500 ml  Net -500 ml     Physical Exam  Awake Alert, No new F.N deficits, Normal affect [place].AT,PERRAL Supple Neck, No JVD,   Good air entry bilaterally, clear to auscultation RRR,No Gallops,Rubs or new Murmurs,  +ve B.Sounds, Abd Soft, No tenderness,   No Cyanosis, Clubbing, right lower extremity wound bandaged, please see picture below.      RN pressure injury documentation: Wound 08/13/24 1840 Pressure Injury Coccyx Mid Stage 2 -  Partial thickness loss of dermis presenting as a shallow open injury with a  red, pink wound bed without slough. (Active)     Wound 08/13/24 1840 Pressure Injury Buttocks Right;Left;Medial Deep Tissue Pressure Injury - Purple or maroon localized area of discolored intact skin or blood-filled blister due to damage of underlying soft tissue from pressure and/or shear. (Active)     Wound 08/13/24 1841 Pressure Injury Ischial tuberosity Right;Left Deep Tissue Pressure Injury - Purple or maroon localized area of discolored intact skin or blood-filled blister due to damage of underlying soft tissue from pressure and/or shear. (Active)     Wound 08/25/24 0750 Pressure Injury Coccyx (Active)      Data Review:    Recent Labs  Lab 08/26/24 0455 08/27/24 0349 08/28/24 0312 08/29/24 0359 08/30/24 0309 08/30/24 0957  WBC 7.7 6.2 5.8 4.4 3.9*  --   HGB 8.2* 8.4* 8.5* 7.8* 8.4* 9.2*  HCT 25.1* 26.3* 26.5* 24.7* 26.3* 27.0*  PLT 109* 122* 133* 122* 109*  --   MCV 107.7* 109.1* 108.2* 111.3* 108.2*  --   MCH 35.2* 34.9* 34.7* 35.1* 34.6*  --   MCHC 32.7 31.9 32.1 31.6 31.9  --   RDW 13.9 13.9 13.8 14.1 16.7*  --   LYMPHSABS  --   --   --   --  0.6*  --   MONOABS  --   --   --   --  0.6  --   EOSABS  --   --   --   --  0.2  --   BASOSABS  --   --   --   --  0.0  --     Recent Labs  Lab 08/25/24 0325 08/25/24 0344 08/25/24 1200 08/26/24 0455 08/27/24 0349 08/28/24 0312 08/29/24 0359 08/30/24 0309 08/30/24 0957 08/31/24 0334  NA 139 138   < > 141 140 139 140 141 141 142  K 4.6 4.2   < > 3.5 3.8 3.7 3.9 4.1 4.1 3.8  CL 94*  --    < > 100 99 99 100 101  --  101  CO2 29  --    < > 33* 33* 34* 37* 37*  --  35*  ANIONGAP 15  --    < > 8 9 6  4* 4*  --  6  GLUCOSE 75  --    < > 96 89 99 84 87  --  67*  BUN 34*  --    < > 32* 25* 31* 31* 26*  --  18  CREATININE 2.81*  --    < > 1.76* 1.06 1.17  0.93 0.59*  --  0.53*  CRP  --   --   --   --   --  4.8*  --  2.9*  --   --   PROCALCITON  --   --   --   --   --  0.13  --  0.10  --   --   LATICACIDVEN  --  1.5  --   --    --   --   --   --   --   --   INR 1.9*  --   --   --   --   --   --   --   --   --   TSH  --   --   --   --   --   --  1.150  --   --   --   MG  --   --   --  1.9 2.0 2.1 1.9 1.9  --   --   PHOS  --   --   --  3.3 2.2* 3.0 2.9 2.2*  --   --   CALCIUM  8.6*  --    < > 8.2* 8.5* 8.6* 8.7* 8.6*  --  8.5*   < > = values in this interval not displayed.      Recent Labs  Lab 08/25/24 0325 08/25/24 0344 08/25/24 1200 08/26/24 0455 08/27/24 0349 08/28/24 0312 08/29/24 0359 08/30/24 0309 08/31/24 0334  CRP  --   --   --   --   --  4.8*  --  2.9*  --   PROCALCITON  --   --   --   --   --  0.13  --  0.10  --   LATICACIDVEN  --  1.5  --   --   --   --   --   --   --   INR 1.9*  --   --   --   --   --   --   --   --   TSH  --   --   --   --   --   --  1.150  --   --   MG  --   --   --  1.9 2.0 2.1 1.9 1.9  --   CALCIUM  8.6*  --    < > 8.2* 8.5* 8.6* 8.7* 8.6* 8.5*   < > = values in this interval not displayed.    --------------------------------------------------------------------------------------------------------------- Lab Results  Component Value Date   CHOL 99 12/12/2023   HDL 53.40 12/12/2023   LDLCALC 36 12/12/2023   LDLDIRECT 65 05/16/2019   TRIG 45.0 12/12/2023   CHOLHDL 2 12/12/2023    Lab Results  Component Value Date   HGBA1C 5.3 05/05/2023   Recent Labs    08/29/24 0359  TSH 1.150  FREET4 1.33   Recent Labs    08/29/24 0359  RETICCTPCT 3.1   ------------------------------------------------------------------------------------------------------------------ Cardiac Enzymes No results for input(s): CKMB, TROPONINI, MYOGLOBIN in the last 168 hours.  Invalid input(s): CK ProBNP    Component Value Date/Time   PROBNP 1,027.0 (H) 08/28/2024 0312   PROBNP 549 (H) 04/10/2024 1338     Micro Results Recent Results (from the past 240 hours)  Culture, blood (single)     Status: None   Collection Time: 08/25/24  5:20 AM   Specimen: BLOOD LEFT ARM   Result Value Ref Range Status  Specimen Description BLOOD LEFT ARM  Final   Special Requests   Final    BOTTLES DRAWN AEROBIC AND ANAEROBIC Blood Culture results may not be optimal due to an inadequate volume of blood received in culture bottles   Culture   Final    NO GROWTH 5 DAYS Performed at [facility], [address] [place], [place] [address]    Report Status 08/30/2024 FINAL  Final  MRSA Next Gen by PCR, Nasal     Status: None   Collection Time: 08/25/24  6:07 AM   Specimen: Nasal Mucosa; Nasal Swab  Result Value Ref Range Status   MRSA by PCR Next Gen NOT DETECTED NOT DETECTED Final    Comment: (NOTE) The GeneXpert MRSA Assay (FDA approved for NASAL specimens only), is one component of a comprehensive MRSA colonization surveillance program. It is not intended to diagnose MRSA infection nor to guide or monitor treatment for MRSA infections. Test performance is not FDA approved in patients less than 71 years old. Performed at [facility], [address] [place], [place] [address]     Radiology Report CARDIAC CATHETERIZATION Result Date: 08/30/2024 1. Low filling pressures. 2. Mild pulmonary hypertension with PVR 3 WU.  Suspect most likely WHO group 3 PH in setting of underlying lung disease/OSA. 3. Preserved cardiac output.  Suspect thermodilution is more accurate given fluctuations in oxygen saturation throughout case. I do not think that he would be a candidate for selective pulmonary vasodilators and he appears well-diuresed.   DG Chest Port 1 View Result Date: 08/28/2024 EXAM: 1 VIEW(S) XRAY OF THE CHEST 08/28/2024 06:38:00 AM COMPARISON: Portable chest 08/25/2024. CLINICAL HISTORY: SOB (shortness of breath) 141880 shortness of breath. FINDINGS: LINES, TUBES AND DEVICES: Telemetry leads overlie the chest. Right PICC again noted with the tip at the brachiocephalic/SVC junction. LUNGS AND PLEURA: Chronic elevated right hemidiaphragm with colonic interposition.  Patchy airspace disease continues in the lower lung fields, not well seen on the right due to the elevated hemidiaphragm. There are small pleural effusions. No pneumothorax. HEART AND MEDIASTINUM: Stable cardiomegaly and mild central vascular prominence. Stable mediastinum with aortic atherosclerosis. BONES AND SOFT TISSUES: Moderate thoracic spondylosis. No acute osseous abnormality. IMPRESSION: 1. Patchy airspace disease in the lower lung fields, not well seen on the right due to the elevated hemidiaphragm. Overall aeration seems unchanged. 2. Small pleural effusions. 3. Stable cardiomegaly and mild central vascular prominence. Electronically signed by: Francis Quam MD 08/28/2024 07:12 AM EST RP Workstation: [id number]   US  RENAL Result Date: 08/26/2024 EXAM: RETROPERITONEAL ULTRASOUND OF THE KIDNEYS 08/26/2024 09:58:44 PM TECHNIQUE: Real-time ultrasonography of the retroperitoneum, specifically the kidneys and urinary bladder, was performed. Evaluation of the kidneys is limited by body habitus. Patient unable to hold breath. COMPARISON: US  Renal 07/09/2024 and CT 07/10/2024. CLINICAL HISTORY: Acute kidney injury. FINDINGS: RIGHT KIDNEY: Right kidney measures 11.0 cm in length. Normal cortical echogenicity. No hydronephrosis. No calculus. No mass. Right ureteral jet is not visualized. LEFT KIDNEY: Left kidney measures 12.4 cm in length. Normal cortical echogenicity. No hydronephrosis. No calculus. No mass. Left ureteral jet is not visualized. BLADDER: Unremarkable appearance of the bladder. IMPRESSION: 1. No acute renal abnormality. Electronically signed by: Norman Gatlin MD 08/26/2024 10:08 PM EST RP Workstation: [id number]   DG Chest Port 1 View Result Date: 08/25/2024 EXAM: 1 VIEW XRAY OF THE CHEST 08/25/2024 03:10:00 AM COMPARISON: 08/20/2024 CLINICAL HISTORY: Shortness of breath. FINDINGS: LINES, TUBES AND DEVICES: Stable right PICC line. LUNGS AND PLEURA: Low  lung volumes with elevated right  hemidiaphragm with superimposed linear and veiling opacity at the right lung base. Small pleural effusion. No pneumothorax or pulmonary edema. HEART AND MEDIASTINUM: No acute abnormality of the cardiac and mediastinal silhouettes. BONES AND SOFT TISSUES: No acute osseous abnormality. Bowel gas below the diaphragm. IMPRESSION: 1. Right basilar atelectasis in the setting of low lung volumes and an elevated right hemidiaphragm. 2. Small right pleural effusion. No pneumothorax or pulmonary edema. Electronically signed by: Waddell Calk MD 08/25/2024 06:44 AM EST RP Workstation: [id number]   DG CHEST PORT 1 VIEW Result Date: 08/20/2024 CLINICAL DATA:  Hypoxia. EXAM: PORTABLE CHEST 1 VIEW COMPARISON:  08/18/2024 and CT chest 07/12/2024. FINDINGS: Trachea is midline. Heart is enlarged. Thoracic aorta is calcified. Lungs are low in volume with bibasilar airspace opacification, slightly improved from 08/18/2024. Bibasilar atelectasis. Difficult to exclude small bilateral pleural effusions. Elevated right hemidiaphragm. IMPRESSION: 1. Low lung volumes with an elevated right hemidiaphragm. Associated bibasilar atelectasis, improved from 08/18/2024. 2. Difficult to exclude small bilateral pleural effusions. Electronically Signed   By: Newell Eke M.D.   On: 08/20/2024 14:11   US  EKG SITE RITE Result Date: 08/19/2024 If Site Rite image not attached, placement could not be confirmed due to current cardiac rhythm.  DG CHEST PORT 1 VIEW Result Date: 08/18/2024 CLINICAL DATA:  Aspiration into airway. EXAM: PORTABLE CHEST 1 VIEW COMPARISON:  08/14/2024, 07/12/2024. FINDINGS: The heart is enlarged and the mediastinal contour stable. There is atherosclerotic calcification of the aorta. Lung volumes are low. Interstitial prominence is noted bilaterally with airspace disease at the lung bases. There are likely small bilateral pleural effusions. No pneumothorax is seen. Cervical spinal fusion hardware is noted. IMPRESSION: 1.  Increased interstitial prominence with airspace disease at the lung bases, possible edema or infiltrate. 2. Suspected small bilateral pleural effusions. Electronically Signed   By: Leita Waddell M.D.   On: 08/18/2024 16:47   ECHO TEE Result Date: 08/17/2024    TRANSESOPHOGEAL ECHO REPORT   Patient Name:   ELISON WORREL Date of Exam: 08/17/2024 Medical Rec #:  [id number]   Height:       71.0 in Accession #:    [id number]  Weight:       272.3 lb Date of Birth:  [birth year]   BSA:          2.404 m Patient Age:    74 years    BP:           140/89 mmHg Patient Gender: M           HR:           93 bpm. Exam Location:  Inpatient Procedure: Transesophageal Echo, Cardiac Doppler, Color Doppler and Saline            Contrast Bubble Study (Both Spectral and Color Flow Doppler were            utilized during procedure). Indications:    Bacteremia  History:        Patient has prior history of Echocardiogram examinations.  Sonographer:    Jayson Gaskins Referring Phys: [id number] ZANE ADAMS PROCEDURE: The transesophogeal probe was passed without difficulty through the esophogus of the patient. Sedation performed by different physician. The patient developed no complications during the procedure.  IMPRESSIONS  1. Left ventricular ejection fraction, by estimation, is 60 to 65%. The left ventricle has normal function. The left ventricle has no regional wall motion abnormalities.  2. Right ventricular systolic function is normal. The right ventricular  size is mildly enlarged. Tricuspid regurgitation signal is inadequate for assessing PA pressure.  3. Left atrial size was mildly dilated. No left atrial/left atrial appendage thrombus was detected.  4. Right atrial size was moderately dilated.  5. The mitral valve is normal in structure. No evidence of mitral valve regurgitation. No evidence of mitral stenosis.  6. Tricuspid valve regurgitation is mild to moderate.  7. The aortic valve is tricuspid. There is mild calcification of the aortic  valve. Aortic valve regurgitation is mild. Aortic valve sclerosis is present, with no evidence of aortic valve stenosis.  8. Aortic dilatation noted. There is mild dilatation of the aortic root, measuring 42 mm.  9. Agitated saline contrast bubble study was negative, with no evidence of any interatrial shunt. Conclusion(s)/Recommendation(s): No evidence of vegetation/infective endocarditis on this transesophageael echocardiogram. FINDINGS  Left Ventricle: Left ventricular ejection fraction, by estimation, is 60 to 65%. The left ventricle has normal function. The left ventricle has no regional wall motion abnormalities. The left ventricular internal cavity size was normal in size. There is  no left ventricular hypertrophy. Right Ventricle: The right ventricular size is mildly enlarged. No increase in right ventricular wall thickness. Right ventricular systolic function is normal. Tricuspid regurgitation signal is inadequate for assessing PA pressure. Left Atrium: Left atrial size was mildly dilated. No left atrial/left atrial appendage thrombus was detected. Right Atrium: Right atrial size was moderately dilated. Pericardium: There is no evidence of pericardial effusion. Mitral Valve: The mitral valve is normal in structure. No evidence of mitral valve regurgitation. No evidence of mitral valve stenosis. Tricuspid Valve: The tricuspid valve is normal in structure. Tricuspid valve regurgitation is mild to moderate. Aortic Valve: The aortic valve is tricuspid. There is mild calcification of the aortic valve. Aortic valve regurgitation is mild. Aortic valve sclerosis is present, with no evidence of aortic valve stenosis. Pulmonic Valve: The pulmonic valve was grossly normal. Pulmonic valve regurgitation is mild to moderate. No evidence of pulmonic stenosis. Aorta: Aortic dilatation noted. There is mild dilatation of the aortic root, measuring 42 mm. IAS/Shunts: There is left bowing of the interatrial septum, suggestive  of elevated right atrial pressure. There is redundancy of the interatrial septum. No atrial level shunt detected by color flow Doppler. Agitated saline contrast was given intravenously to evaluate for intracardiac shunting. Agitated saline contrast bubble study was negative, with no evidence of any interatrial shunt.   AORTA Ao Root diam: 4.23 cm Ao Asc diam:  3.24 cm Jerel Balding MD Electronically signed by Jerel Balding MD Signature Date/Time: 08/17/2024/2:49:15 PM    Final    EP STUDY Result Date: 08/17/2024 See surgical note for result.  ECHOCARDIOGRAM COMPLETE Result Date: 08/14/2024    ECHOCARDIOGRAM REPORT   Patient Name:   KAINEN STRUCKMAN Date of Exam: 08/14/2024 Medical Rec #:  [id number]   Height:       71.0 in Accession #:    [id number]  Weight:       272.3 lb Date of Birth:  1950/09/22   BSA:          2.404 m Patient Age:    74 years    BP:           92/33 mmHg Patient Gender: M           HR:           70 bpm. Exam Location:  Inpatient Procedure: 2D Echo, Cardiac Doppler, Color Doppler and Intracardiac  Opacification Agent (Both Spectral and Color Flow Doppler were            utilized during procedure). Indications:    Shock  History:        Patient has prior history of Echocardiogram examinations, most                 recent 07/10/2024. CHF, CAD, Abnormal ECG, Arrythmias:Atrial                 Fibrillation, Signs/Symptoms:Bacteremia; Risk                 Factors:Hypertension.  Sonographer:    Ellouise Mose RDCS Referring Phys: [id number] ERIC J AUSTRIA  Sonographer Comments: Technically difficult study due to poor echo windows, suboptimal parasternal window, suboptimal apical window and suboptimal subcostal window. Study was originally ordered as limited, then MD changed to complete with definity . IMPRESSIONS  1. Left ventricular ejection fraction, by estimation, is 70 to 75%. The left ventricle has hyperdynamic function. The left ventricle has no regional wall motion abnormalities. Left  ventricular diastolic parameters are indeterminate.  2. Right ventricular systolic function is moderately reduced. The right ventricular size is severely enlarged. Mildly increased right ventricular wall thickness. There is severely elevated pulmonary artery systolic pressure. The estimated right ventricular systolic pressure is 66.0 mmHg.  3. Left atrial size was mildly dilated.  4. Right atrial size was moderately dilated.  5. The mitral valve is grossly normal. No evidence of mitral valve regurgitation. No evidence of mitral stenosis.  6. Tricuspid valve regurgitation is mild to moderate.  7. The aortic valve is grossly normal. Aortic valve regurgitation is not visualized.  8. Aortic dilatation noted. There is mild dilatation of the ascending aorta, measuring 41 mm.  9. The inferior vena cava is dilated in size with <50% respiratory variability, suggesting right atrial pressure of 15 mmHg. Comparison(s): Prior images reviewed side by side. Changes from prior study are noted. Right ventricular size has increased and right ventricular function has decreased. FINDINGS  Left Ventricle: Left ventricular ejection fraction, by estimation, is 70 to 75%. The left ventricle has hyperdynamic function. The left ventricle has no regional wall motion abnormalities. Definity  contrast agent was given IV to delineate the left ventricular endocardial borders. The left ventricular internal cavity size was normal in size. There is borderline concentric left ventricular hypertrophy. Left ventricular diastolic function could not be evaluated due to atrial fibrillation. Left ventricular diastolic parameters are indeterminate. Right Ventricle: The right ventricular size is severely enlarged. Mildly increased right ventricular wall thickness. Right ventricular systolic function is moderately reduced. There is severely elevated pulmonary artery systolic pressure. The tricuspid regurgitant velocity is 3.57 m/s, and with an assumed right  atrial pressure of 15 mmHg, the estimated right ventricular systolic pressure is 66.0 mmHg. Left Atrium: Left atrial size was mildly dilated. Right Atrium: Right atrial size was moderately dilated. Pericardium: There is no evidence of pericardial effusion. Mitral Valve: The mitral valve is grossly normal. Mild mitral annular calcification. No evidence of mitral valve regurgitation. No evidence of mitral valve stenosis. Tricuspid Valve: The tricuspid valve is grossly normal. Tricuspid valve regurgitation is mild to moderate. No evidence of tricuspid stenosis. Aortic Valve: The aortic valve is grossly normal. Aortic valve regurgitation is not visualized. Pulmonic Valve: The pulmonic valve was grossly normal. Pulmonic valve regurgitation is mild. Aorta: Aortic dilatation noted. There is mild dilatation of the ascending aorta, measuring 41 mm. Venous: The inferior vena cava is dilated in size with less  than 50% respiratory variability, suggesting right atrial pressure of 15 mmHg. IAS/Shunts: The interatrial septum was not well visualized.  LEFT VENTRICLE PLAX 2D LVIDd:         4.00 cm LVIDs:         2.70 cm LV PW:         1.00 cm LV IVS:        1.20 cm  LV Volumes (MOD) LV vol d, MOD A2C: 109.7 ml LV vol d, MOD A4C: 112.0 ml LV vol s, MOD A2C: 25.5 ml LV vol s, MOD A4C: 41.3 ml LV SV MOD A2C:     84.3 ml LV SV MOD A4C:     112.0 ml LV SV MOD BP:      78.9 ml IVC IVC diam: 2.45 cm RIGHT ATRIUM           Index RA Area:     25.10 cm RA Volume:   80.90 ml  33.65 ml/m  AORTIC VALVE LVOT Vmax:   89.70 cm/s LVOT Vmean:  56.000 cm/s LVOT VTI:    0.194 m  AORTA Ao Asc diam: 4.03 cm MITRAL VALVE                TRICUSPID VALVE MV Area (PHT): 3.65 cm     TR Peak grad:   51.0 mmHg MV Decel Time: 208 msec     TR Vmax:        357.00 cm/s MV E velocity: 102.00 cm/s                             SHUNTS                             Systemic VTI: 0.19 m Jerel Croitoru MD Electronically signed by Jerel Balding MD Signature Date/Time:  08/14/2024/7:26:01 PM    Final    VAS US  ABI WITH/WO TBI Result Date: 08/14/2024  LOWER EXTREMITY DOPPLER STUDY Patient Name:  Vaibhav Fogleman  Date of Exam:   08/14/2024 Medical Rec #: [id number]    Accession #:    [id number] Date of Birth: 1951-05-06    Patient Gender: M Patient Age:   60 years Exam Location:  [facility] Procedure:      VAS US  ABI WITH/WO TBI Referring Phys: ERIC AUSTRIA --------------------------------------------------------------------------------  Indications: Peripheral artery disease. High Risk Factors: Hypertension, hyperlipidemia, past history of smoking,                    coronary artery disease.  Vascular Interventions: CHF, Lymphedema, Hx of DVT and venous stasis disease. Limitations: Today's exam was limited due to continuous patient movement,              tough/thickened skin. Comparison Study: Previous exam 03/04/2022 Performing Technologist: Leigh Rom RVT, RDMS  Examination Guidelines: A complete evaluation includes at minimum, Doppler waveform signals and systolic blood pressure reading at the level of bilateral brachial, anterior tibial, and posterior tibial arteries, when vessel segments are accessible. Bilateral testing is considered an integral part of a complete examination. Photoelectric Plethysmograph (PPG) waveforms and toe systolic pressure readings are included as required and additional duplex testing as needed. Limited examinations for reoccurring indications may be performed as noted.  ABI Findings: +---------+------------------+-----+----------+----------------+ Right    Rt Pressure (mmHg)IndexWaveform  Comment          +---------+------------------+-----+----------+----------------+ Brachial 105  triphasic                  +---------+------------------+-----+----------+----------------+ PTA                             monophasicnon compressible +---------+------------------+-----+----------+----------------+ DP                               monophasicnon compressible +---------+------------------+-----+----------+----------------+ Great Toe26                0.23 Abnormal                   +---------+------------------+-----+----------+----------------+ +---------+------------------+-----+----------+----------------+ Left     Lt Pressure (mmHg)IndexWaveform  Comment          +---------+------------------+-----+----------+----------------+ Brachial 112                    triphasic                  +---------+------------------+-----+----------+----------------+ PTA      127               1.13 monophasic                 +---------+------------------+-----+----------+----------------+ DP                              monophasicnon compressible +---------+------------------+-----+----------+----------------+ Great Toe20                0.18 Abnormal                   +---------+------------------+-----+----------+----------------+ +-------+-----------+-----------+------------+------------+ ABI/TBIToday's ABIToday's TBIPrevious ABIPrevious TBI +-------+-----------+-----------+------------+------------+ Right  [place]         0.23       [place]          0.75         +-------+-----------+-----------+------------+------------+ Left   [place]         0.18       [place]          0.57         +-------+-----------+-----------+------------+------------+ Arterial wall calcification precludes accurate ankle pressures and ABIs. Bilateral ABIs appear essentially unchanged. Bilateral TBIs appear decreased.  Summary: Right: Resting right ankle-brachial index indicates noncompressible right lower extremity arteries. The right toe-brachial index is abnormal.  Left: Resting left ankle-brachial index indicates noncompressible left lower extremity arteries. The left toe-brachial index is abnormal.  *See table(s) above for measurements and observations.  Electronically signed by Gaile New MD on 08/14/2024 at 5:42:38 PM.     Final    DG CHEST PORT 1 VIEW Result Date: 08/14/2024 CLINICAL DATA:  Shortness of breath. EXAM: PORTABLE CHEST 1 VIEW COMPARISON:  August 13, 2024 FINDINGS: Stable cardiomegaly. Elevated right hemidiaphragm is noted. Minimal bibasilar subsegmental atelectasis is noted with small pleural effusions. IMPRESSION: Minimal bibasilar subsegmental atelectasis with small pleural effusions. Electronically Signed   By: Lynwood Landy Raddle M.D.   On: 08/14/2024 10:48   DG Chest 1 View Result Date: 08/13/2024 CLINICAL DATA:  Shortness of breath. EXAM: CHEST  1 VIEW COMPARISON:  07/17/2024. FINDINGS: Persistent low lung volumes with chronic elevation of the right hemidiaphragm and increased right basilar atelectasis. Left lung appears clear. No pneumothorax. Stable cardiomegaly. No acute osseous abnormality. IMPRESSION: Persistent low lung volumes with chronic elevation of the right hemidiaphragm and increased right basilar atelectasis. Electronically Signed  By: Harrietta Sherry M.D.   On: 08/13/2024 13:54     Signature  -   Brayton Nekhi Liwanag M.D on 08/31/2024 at 10:52 AM   -  To page go to [email or web address]   "
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
## 2024-08-31 NOTE — Progress Notes (Signed)
 [facility] night cross cover note:   I was notified by RN of the patient's request for a sleep aid. I subsequently placed order for prn melatonin for insomnia.     Newton Pigg, DO Hospitalist

## 2024-08-31 NOTE — Progress Notes (Signed)
 SATURATION QUALIFICATIONS: (This note is used to comply with regulatory documentation for home oxygen)  Patient Saturations on Room Air at Rest = 82%  Patient Saturations on Room Air while Ambulating =  patient is not ambulatory   Patient Saturations on 3 Liters of oxygen while lying in bed = 92%  Please briefly explain why patient needs home oxygen: patient desaturates quickly with oxygen removed. Need 3 L at a constant flow for saturations to remain above 90%

## 2024-08-31 NOTE — TOC Progression Note (Addendum)
 Transition of Care [facility]) - Progression Note    Patient Details  Name: [name] MRN: [id number] Date of Birth: 04-10-1951  [facility]) CM/SW Contact  Landry DELENA Senters, RN Phone Number: 08/31/2024, 3:38 PM  Clinical Narrative:     O2 qualifier completed and patient will be discharging home with oxygen.   Patient has NIV through [facility], will have home O2 set up through them. Physical order form to be signed by attending and faxed to [facility].   Contact info for [facility] this weekend is Rosina at [phone].  Patient is active with [facility] for PT/OT, info on AVS.   CM will continue to follow.  Expected Discharge Plan:  (TBD) Barriers to Discharge: Continued Medical Work up               Expected Discharge Plan and Services       Living arrangements for the past 2 months: Single Family Home                                       Social Drivers of Health (SDOH) Interventions SDOH Screenings   Food Insecurity: No Food Insecurity (08/29/2024)  Housing: Unknown (08/29/2024)  Transportation Needs: No Transportation Needs (08/29/2024)  Utilities: Not At Risk (08/29/2024)  Alcohol Screen: Low Risk (06/05/2024)  Depression (PHQ2-9): Low Risk (12/28/2023)  Financial Resource Strain: Low Risk (06/05/2024)  Physical Activity: Inactive (06/05/2024)  Social Connections: Socially Isolated (08/29/2024)  Stress: No Stress Concern Present (06/05/2024)  Tobacco Use: Medium Risk (08/25/2024)  Health Literacy: Adequate Health Literacy (12/28/2023)    Readmission Risk Interventions    08/22/2024   12:31 PM 08/16/2024   11:30 AM  Readmission Risk Prevention Plan  Transportation Screening  Complete  PCP or Specialist Appt within 5-7 Days  Complete  Home Care Screening  Complete  Medication Review (RN CM)  Complete  HRI or Home Care Consult Complete   Social Work Consult for Recovery Care Planning/Counseling Complete   Palliative Care Screening Not Applicable   Medication  Review Oceanographer) Complete

## 2024-08-31 NOTE — Progress Notes (Addendum)
"  °  Progress Note  Patient Name: [name] Date of Encounter: 08/31/2024 [name] Cardiologist: [name] Gull, MD   Interval Summary   Resting in bed Presently on 3 L via [place] Reports some recurrent dyspnea No CBC checked this morning in patient with chronic anemia, will add orders in  Reviewed results from RHC   Vital Signs Vitals:   08/31/24 0000 08/31/24 0400 08/31/24 0422 08/31/24 0812  BP: (!) 142/65 129/61    Pulse:      Resp: (!) 23 16    Temp: 98.1 F (36.7 C) 98.3 F (36.8 C)    TempSrc:      SpO2:    94%  Weight:   121.7 kg   Height:        Intake/Output Summary (Last 24 hours) at 08/31/2024 1009 Last data filed at 08/30/2024 2235 Gross per 24 hour  Intake --  Output 500 ml  Net -500 ml      08/31/2024    4:22 AM 08/30/2024    5:00 AM 08/27/2024    4:14 AM  Last 3 Weights  Weight (lbs) 268 lb 4.8 oz 264 lb 8.8 oz 247 lb 2.2 oz  Weight (kg) 121.7 kg 120 kg 112.1 kg     Telemetry/ECG  Sinus, HR 80s, PVCs - Personally Reviewed  Physical Exam  GEN: No acute distress.   Neck: No JVD Cardiac: RRR, no murmurs, rubs, or gallops.  Respiratory: Clear to auscultation bilaterally. 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 Severe OSA RHC 08/2024: Low filling pressures, mild pulmonary hypertension, preserved cardiac output Likely not candidate for selective pulmonary vasodilators, appeared well diuresed on RHC Recent TEE: LVEF 60 to 65% Suspect pulmonary hypertension in the setting of lung disease/OSA Needs further control/management of OSA/lung disease per primary Does not need further diuresis  Per primary Hypotension on midodrine  History of MSSA bacteremia, sacral wound Anemia of chronic disease History of DVT Electrolyte disturbances Abnormal chest CT Chronic  lymphedema Severe OSA Recurrent AKI  For questions or updates, please contact [name] Please consult [email or web address] for contact info under       Signed, [name] DELENA Donath, PA-C   Personally seen and examined. Agree with above. Spoke to he and son.  Ace wraps LE [place] O2 EF 60% Low filling pressures on RHC. Dyspnea from underlying lung condition/ OSA. No further diuresis.  Discussed with Dr. Sherlon.   [name] Parchment, MD  "

## 2024-08-31 NOTE — Progress Notes (Signed)
 Physical Therapy Treatment Patient Details Name: [name] MRN: [id number] DOB: Feb 09, 1951 Today's Date: 08/31/2024   History of Present Illness Pt is a 74 y/o male presenting with dyspnea and hypotension in setting of hypoxic/hypercarbic respiratory failure. Pt with MSSA bacteremia from sacral wounds. Also developed AKI. Recent admission 1/19-1/25/26 for acute on chronic hypoxia, hypercapnia and sepsis. PMH: CAD, HTN, HLD, inclusion body myositis, DVT, chonic LB edema, paralyzed Rt hemidiaphragm, morbid obesity    PT Comments  Pt received in supine and agreeable to session with encouragement. Education provided on importance of frequent mobility to prevent deconditioning.  Pt reports not feeling well today and HR noted to increase up to 150s with mobility. Pt able to pivot to the recliner with min A, but deferred further mobility due to elevated HR. Pt continues to benefit from PT services to progress toward functional mobility goals.    If plan is discharge home, recommend the following: A little help with walking and/or transfers;Assist for transportation;Assistance with cooking/housework   Can travel by private vehicle        Equipment Recommendations  None recommended by PT    Recommendations for Other Services       Precautions / Restrictions Precautions Precautions: Fall Recall of Precautions/Restrictions: Intact Precaution/Restrictions Comments: monitor sats/HR Restrictions Weight Bearing Restrictions Per Provider Order: No     Mobility  Bed Mobility Overal bed mobility: Needs Assistance Bed Mobility: Supine to Sit     Supine to sit: Mod assist     General bed mobility comments: Assist for trunk elevation    Transfers Overall transfer level: Needs assistance Equipment used: Rolling walker (2 wheels) Transfers: Sit to/from Stand Sit to Stand: Min assist, From elevated surface   Step pivot transfers: Min assist       General transfer comment: From elevated  EOB with min A for rise. Pt maintains flexed trunk with forearms on RW despite cues. Pivot to recliner with assist for RW management and balance    Ambulation/Gait                   Stairs             Wheelchair Mobility     Tilt Bed    Modified Rankin (Stroke Patients Only)       Balance Overall balance assessment: Needs assistance, History of Falls Sitting-balance support: Feet supported, No upper extremity supported Sitting balance-Leahy Scale: Fair     Standing balance support: During functional activity, Reliant on assistive device for balance, Bilateral upper extremity supported Standing balance-Leahy Scale: Poor                              Communication Communication Communication: Impaired Factors Affecting Communication: Hearing impaired  Cognition Arousal: Alert Behavior During Therapy: WFL for tasks assessed/performed   PT - Cognitive impairments: No apparent impairments                         Following commands: Intact      Cueing Cueing Techniques: Verbal cues, Tactile cues, Visual cues  Exercises      General Comments General comments (skin integrity, edema, etc.): HR110s at rest and up to 150s with mobility, RN notified      Pertinent Vitals/Pain Pain Assessment Pain Assessment: Faces Faces Pain Scale: Hurts even more Pain Location: neck,back Pain Descriptors / Indicators: Discomfort, Aching Pain Intervention(s): Limited activity within patient's tolerance, Monitored  during session, Repositioned     PT Goals (current goals can now be found in the care plan section) Acute Rehab PT Goals Patient Stated Goal: to return home to PLOF PT Goal Formulation: With patient Time For Goal Achievement: 09/10/24 Progress towards PT goals: Progressing toward goals    Frequency    Min 2X/week       AM-PAC PT 6 Clicks Mobility   Outcome Measure  Help needed turning from your back to your side while in a  flat bed without using bedrails?: A Little Help needed moving from lying on your back to sitting on the side of a flat bed without using bedrails?: A Lot Help needed moving to and from a bed to a chair (including a wheelchair)?: A Little Help needed standing up from a chair using your arms (e.g., wheelchair or bedside chair)?: A Little Help needed to walk in hospital room?: Total Help needed climbing 3-5 steps with a railing? : Total 6 Click Score: 13    End of Session Equipment Utilized During Treatment: Oxygen;Gait belt Activity Tolerance: Patient tolerated treatment well;Patient limited by fatigue Patient left: in chair;with call bell/phone within reach Nurse Communication: Mobility status PT Visit Diagnosis: Unsteadiness on feet (R26.81);Other abnormalities of gait and mobility (R26.89);Repeated falls (R29.6);History of falling (Z91.81);Muscle weakness (generalized) (M62.81);Difficulty in walking, not elsewhere classified (R26.2);Pain     Time: 8897-8867 PT Time Calculation (min) (ACUTE ONLY): 30 min  Charges:    $Therapeutic Activity: 23-37 mins PT General Charges $$ ACUTE PT VISIT: 1 Visit                    Darryle George, PTA Acute Rehabilitation Services Secure Chat Preferred  [phone]    Darryle George 08/31/2024, 1:58 PM

## 2024-09-05 ENCOUNTER — Ambulatory Visit: Admitting: Internal Medicine

## 2024-09-07 ENCOUNTER — Encounter (HOSPITAL_BASED_OUTPATIENT_CLINIC_OR_DEPARTMENT_OTHER): Admitting: General Surgery

## 2024-09-14 ENCOUNTER — Encounter (HOSPITAL_BASED_OUTPATIENT_CLINIC_OR_DEPARTMENT_OTHER): Admitting: General Surgery

## 2024-09-21 ENCOUNTER — Encounter (HOSPITAL_BASED_OUTPATIENT_CLINIC_OR_DEPARTMENT_OTHER): Admitting: General Surgery

## 2024-11-07 ENCOUNTER — Inpatient Hospital Stay

## 2024-11-07 ENCOUNTER — Inpatient Hospital Stay: Admitting: Physician Assistant

## 2024-12-04 ENCOUNTER — Ambulatory Visit: Admitting: Family Medicine

## 2025-01-02 ENCOUNTER — Encounter
